# Patient Record
Sex: Male | Born: 1965
Health system: Southern US, Community
[De-identification: ages and names within clinical notes are randomized; demographics above are authoritative.]

## PROBLEM LIST (undated history)

## (undated) DIAGNOSIS — K279 Peptic ulcer, site unspecified, unspecified as acute or chronic, without hemorrhage or perforation: Secondary | ICD-10-CM

## (undated) DIAGNOSIS — K219 Gastro-esophageal reflux disease without esophagitis: Secondary | ICD-10-CM

## (undated) DIAGNOSIS — I4891 Unspecified atrial fibrillation: Secondary | ICD-10-CM

## (undated) DIAGNOSIS — G4733 Obstructive sleep apnea (adult) (pediatric): Secondary | ICD-10-CM

## (undated) DIAGNOSIS — I428 Other cardiomyopathies: Secondary | ICD-10-CM

## (undated) DIAGNOSIS — I10 Essential (primary) hypertension: Secondary | ICD-10-CM

## (undated) DIAGNOSIS — Z9119 Patient's noncompliance with other medical treatment and regimen: Secondary | ICD-10-CM

## (undated) DIAGNOSIS — Z9889 Other specified postprocedural states: Secondary | ICD-10-CM

## (undated) DIAGNOSIS — Z91199 Patient's noncompliance with other medical treatment and regimen due to unspecified reason: Secondary | ICD-10-CM

## (undated) DIAGNOSIS — Z9981 Dependence on supplemental oxygen: Secondary | ICD-10-CM

## (undated) DIAGNOSIS — J45909 Unspecified asthma, uncomplicated: Secondary | ICD-10-CM

## (undated) DIAGNOSIS — I509 Heart failure, unspecified: Secondary | ICD-10-CM

## (undated) DIAGNOSIS — I219 Acute myocardial infarction, unspecified: Secondary | ICD-10-CM

## (undated) DIAGNOSIS — M1990 Unspecified osteoarthritis, unspecified site: Secondary | ICD-10-CM

## (undated) HISTORY — DX: Unspecified osteoarthritis, unspecified site: M19.90

## (undated) HISTORY — DX: Peptic ulcer, site unspecified, unspecified as acute or chronic, without hemorrhage or perforation: K27.9

## (undated) HISTORY — DX: Gastro-esophageal reflux disease without esophagitis: K21.9

---

## 2005-10-30 ENCOUNTER — Ambulatory Visit: Payer: Self-pay | Admitting: *Deleted

## 2005-10-30 ENCOUNTER — Observation Stay (HOSPITAL_COMMUNITY): Admission: EM | Admit: 2005-10-30 | Discharge: 2005-10-31 | Payer: Self-pay | Admitting: Emergency Medicine

## 2007-07-07 IMAGING — CR DG CHEST 1V PORT
1 series · 1 of 1 positions shown · non-contrast
Comparison: none

CLINICAL DATA: 39-year-old female, shortness of breath, history of asthma.
 PORTABLE CHEST ? 1 VIEW:

[view not recorded]
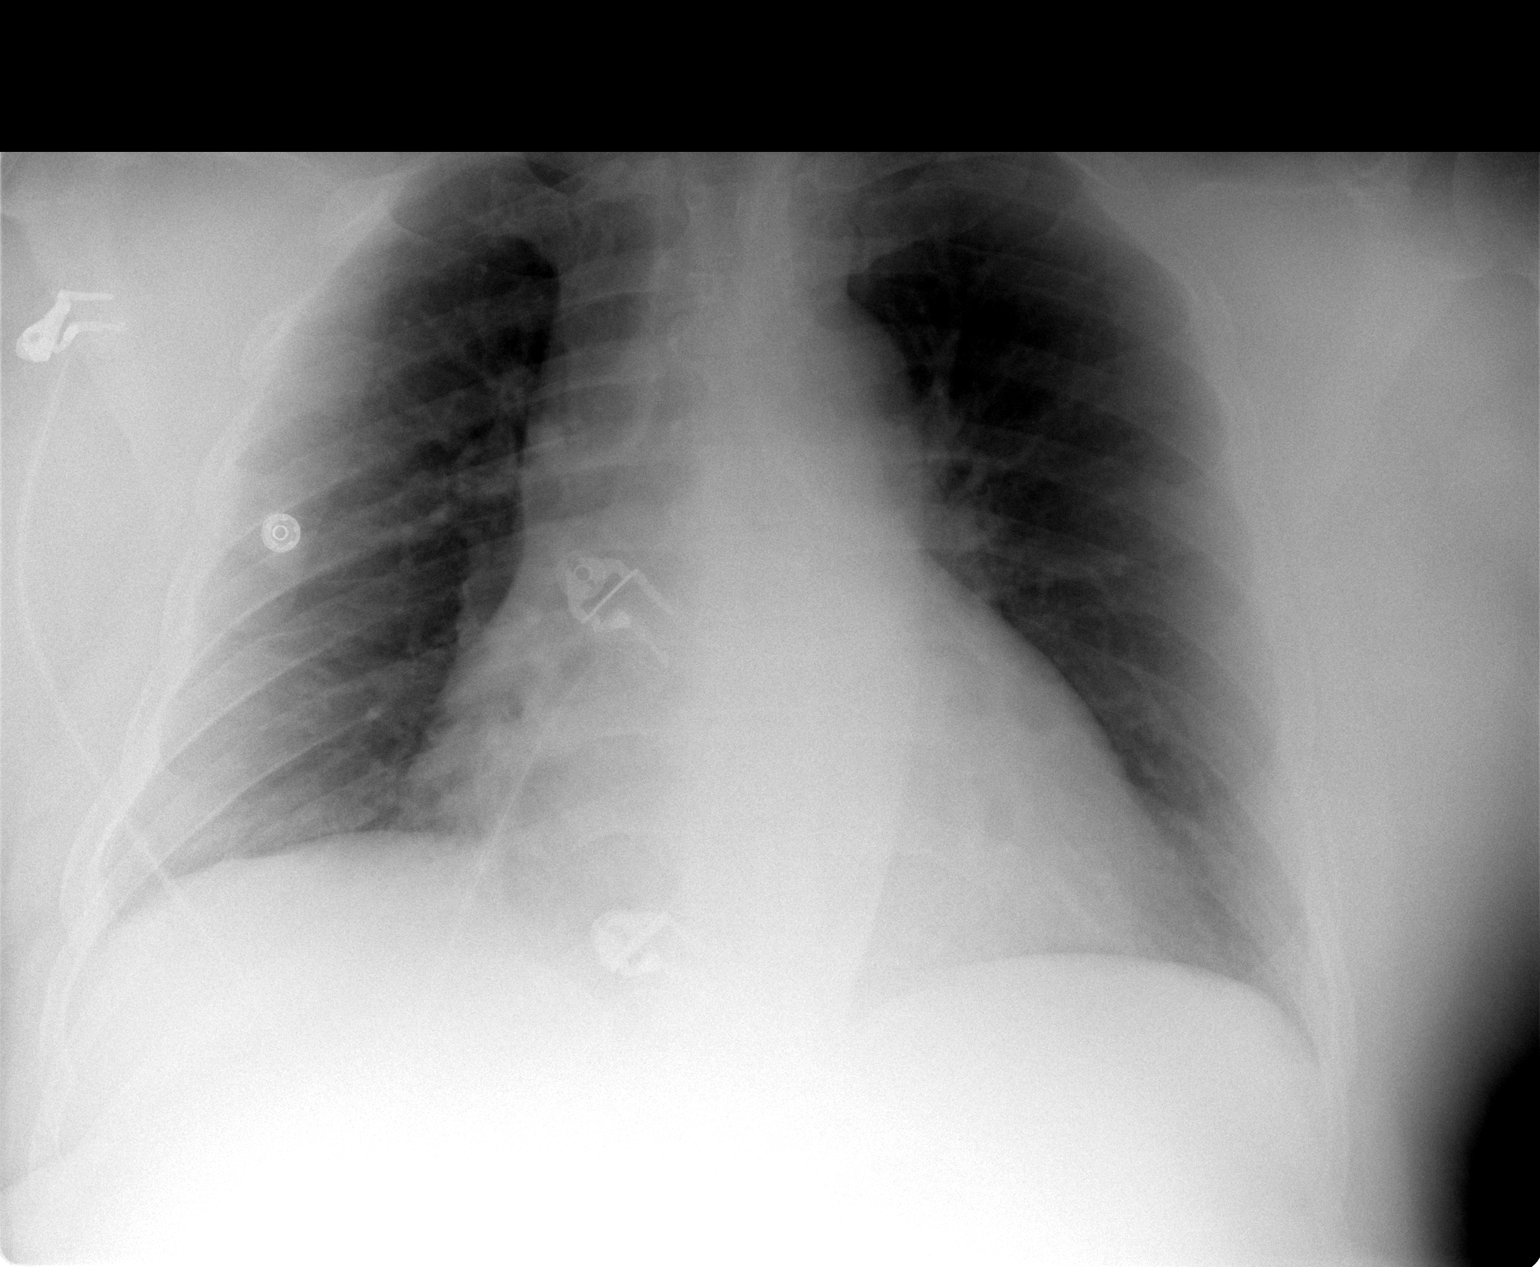

[1 of 1 positions shown; findings below may reference images not displayed]

FINDINGS: Cardiomegaly with mild vascular congestion and right base atelectasis.  No definite edema, consolidation, pneumonia, effusion, or pneumothorax.
IMPRESSION: 1.  Cardiomegaly and vascular congestion.
 2.  Right base atelectasis.

## 2007-12-04 ENCOUNTER — Emergency Department (HOSPITAL_COMMUNITY): Admission: EM | Admit: 2007-12-04 | Discharge: 2007-12-04 | Payer: Self-pay | Admitting: Emergency Medicine

## 2007-12-06 ENCOUNTER — Emergency Department (HOSPITAL_COMMUNITY): Admission: EM | Admit: 2007-12-06 | Discharge: 2007-12-06 | Payer: Self-pay | Admitting: Emergency Medicine

## 2007-12-07 ENCOUNTER — Observation Stay (HOSPITAL_COMMUNITY): Admission: EM | Admit: 2007-12-07 | Discharge: 2007-12-08 | Payer: Self-pay | Admitting: Emergency Medicine

## 2007-12-07 ENCOUNTER — Encounter: Payer: Self-pay | Admitting: Cardiology

## 2007-12-07 ENCOUNTER — Ambulatory Visit: Payer: Self-pay | Admitting: Cardiology

## 2008-01-30 ENCOUNTER — Ambulatory Visit: Payer: Self-pay | Admitting: Cardiology

## 2008-02-13 ENCOUNTER — Ambulatory Visit: Payer: Self-pay | Admitting: Cardiology

## 2008-02-14 ENCOUNTER — Ambulatory Visit: Payer: Self-pay | Admitting: Cardiology

## 2008-02-14 ENCOUNTER — Encounter (HOSPITAL_COMMUNITY): Admission: RE | Admit: 2008-02-14 | Discharge: 2008-03-15 | Payer: Self-pay | Admitting: Cardiology

## 2008-02-20 ENCOUNTER — Ambulatory Visit: Payer: Self-pay | Admitting: Cardiology

## 2008-02-29 ENCOUNTER — Ambulatory Visit: Payer: Self-pay | Admitting: Cardiology

## 2008-07-03 ENCOUNTER — Ambulatory Visit: Payer: Self-pay | Admitting: Cardiology

## 2008-07-12 ENCOUNTER — Ambulatory Visit: Payer: Self-pay | Admitting: Internal Medicine

## 2008-07-12 DIAGNOSIS — K219 Gastro-esophageal reflux disease without esophagitis: Secondary | ICD-10-CM

## 2008-07-12 DIAGNOSIS — F3289 Other specified depressive episodes: Secondary | ICD-10-CM | POA: Insufficient documentation

## 2008-07-12 DIAGNOSIS — K279 Peptic ulcer, site unspecified, unspecified as acute or chronic, without hemorrhage or perforation: Secondary | ICD-10-CM

## 2008-07-12 DIAGNOSIS — I509 Heart failure, unspecified: Secondary | ICD-10-CM | POA: Insufficient documentation

## 2008-07-12 DIAGNOSIS — M1990 Unspecified osteoarthritis, unspecified site: Secondary | ICD-10-CM | POA: Insufficient documentation

## 2008-07-12 DIAGNOSIS — F411 Generalized anxiety disorder: Secondary | ICD-10-CM

## 2008-07-12 DIAGNOSIS — I10 Essential (primary) hypertension: Secondary | ICD-10-CM | POA: Insufficient documentation

## 2008-07-12 DIAGNOSIS — J309 Allergic rhinitis, unspecified: Secondary | ICD-10-CM | POA: Insufficient documentation

## 2008-07-12 DIAGNOSIS — R0602 Shortness of breath: Secondary | ICD-10-CM

## 2008-07-12 DIAGNOSIS — E785 Hyperlipidemia, unspecified: Secondary | ICD-10-CM | POA: Insufficient documentation

## 2008-07-12 DIAGNOSIS — F329 Major depressive disorder, single episode, unspecified: Secondary | ICD-10-CM

## 2008-07-13 ENCOUNTER — Ambulatory Visit: Admission: RE | Admit: 2008-07-13 | Discharge: 2008-07-13 | Payer: Self-pay | Admitting: Physician Assistant

## 2008-08-27 ENCOUNTER — Emergency Department (HOSPITAL_COMMUNITY): Admission: EM | Admit: 2008-08-27 | Discharge: 2008-08-27 | Payer: Self-pay | Admitting: Emergency Medicine

## 2008-08-28 ENCOUNTER — Emergency Department (HOSPITAL_COMMUNITY): Admission: EM | Admit: 2008-08-28 | Discharge: 2008-08-28 | Payer: Self-pay | Admitting: Emergency Medicine

## 2008-12-11 ENCOUNTER — Ambulatory Visit: Payer: Self-pay | Admitting: Cardiology

## 2008-12-25 ENCOUNTER — Encounter: Payer: Self-pay | Admitting: Cardiology

## 2008-12-25 ENCOUNTER — Encounter (INDEPENDENT_AMBULATORY_CARE_PROVIDER_SITE_OTHER): Payer: Self-pay | Admitting: *Deleted

## 2008-12-25 LAB — CONVERTED CEMR LAB
AST: 25 units/L
Alkaline Phosphatase: 48 units/L
Alkaline Phosphatase: 48 units/L (ref 39–117)
BUN: 13 mg/dL
BUN: 13 mg/dL (ref 6–23)
CO2: 26 meq/L
CO2: 26 meq/L (ref 19–32)
Calcium: 9.2 mg/dL
Calcium: 9.2 mg/dL (ref 8.4–10.5)
Cholesterol: 178 mg/dL
Cholesterol: 178 mg/dL (ref 0–200)
Glucose, Bld: 89 mg/dL
LDL Cholesterol: 113 mg/dL
LDL Cholesterol: 113 mg/dL — ABNORMAL HIGH (ref 0–99)
Potassium: 3.8 meq/L
Sodium: 142 meq/L (ref 135–145)
Total CHOL/HDL Ratio: 4.3
Total Protein: 6.7 g/dL
Total Protein: 6.7 g/dL (ref 6.0–8.3)
Triglycerides: 119 mg/dL (ref <150)

## 2009-01-01 ENCOUNTER — Telehealth (INDEPENDENT_AMBULATORY_CARE_PROVIDER_SITE_OTHER): Payer: Self-pay | Admitting: *Deleted

## 2009-02-11 ENCOUNTER — Ambulatory Visit: Payer: Self-pay | Admitting: Cardiology

## 2009-02-11 DIAGNOSIS — J45909 Unspecified asthma, uncomplicated: Secondary | ICD-10-CM

## 2009-02-11 DIAGNOSIS — G4730 Sleep apnea, unspecified: Secondary | ICD-10-CM

## 2009-03-11 ENCOUNTER — Ambulatory Visit: Payer: Self-pay | Admitting: Cardiology

## 2009-03-14 ENCOUNTER — Telehealth: Payer: Self-pay | Admitting: Cardiology

## 2009-04-08 ENCOUNTER — Ambulatory Visit: Payer: Self-pay | Admitting: Cardiology

## 2009-08-12 IMAGING — CR DG CHEST 2V
2 series · 2 of 2 positions shown · non-contrast
Comparison: 10/30/2005

CLINICAL DATA: Dyspnea, left chest pain, history asthma, smoking,
hypertension

CHEST - 2 VIEW

[view not recorded (1 of 2)]
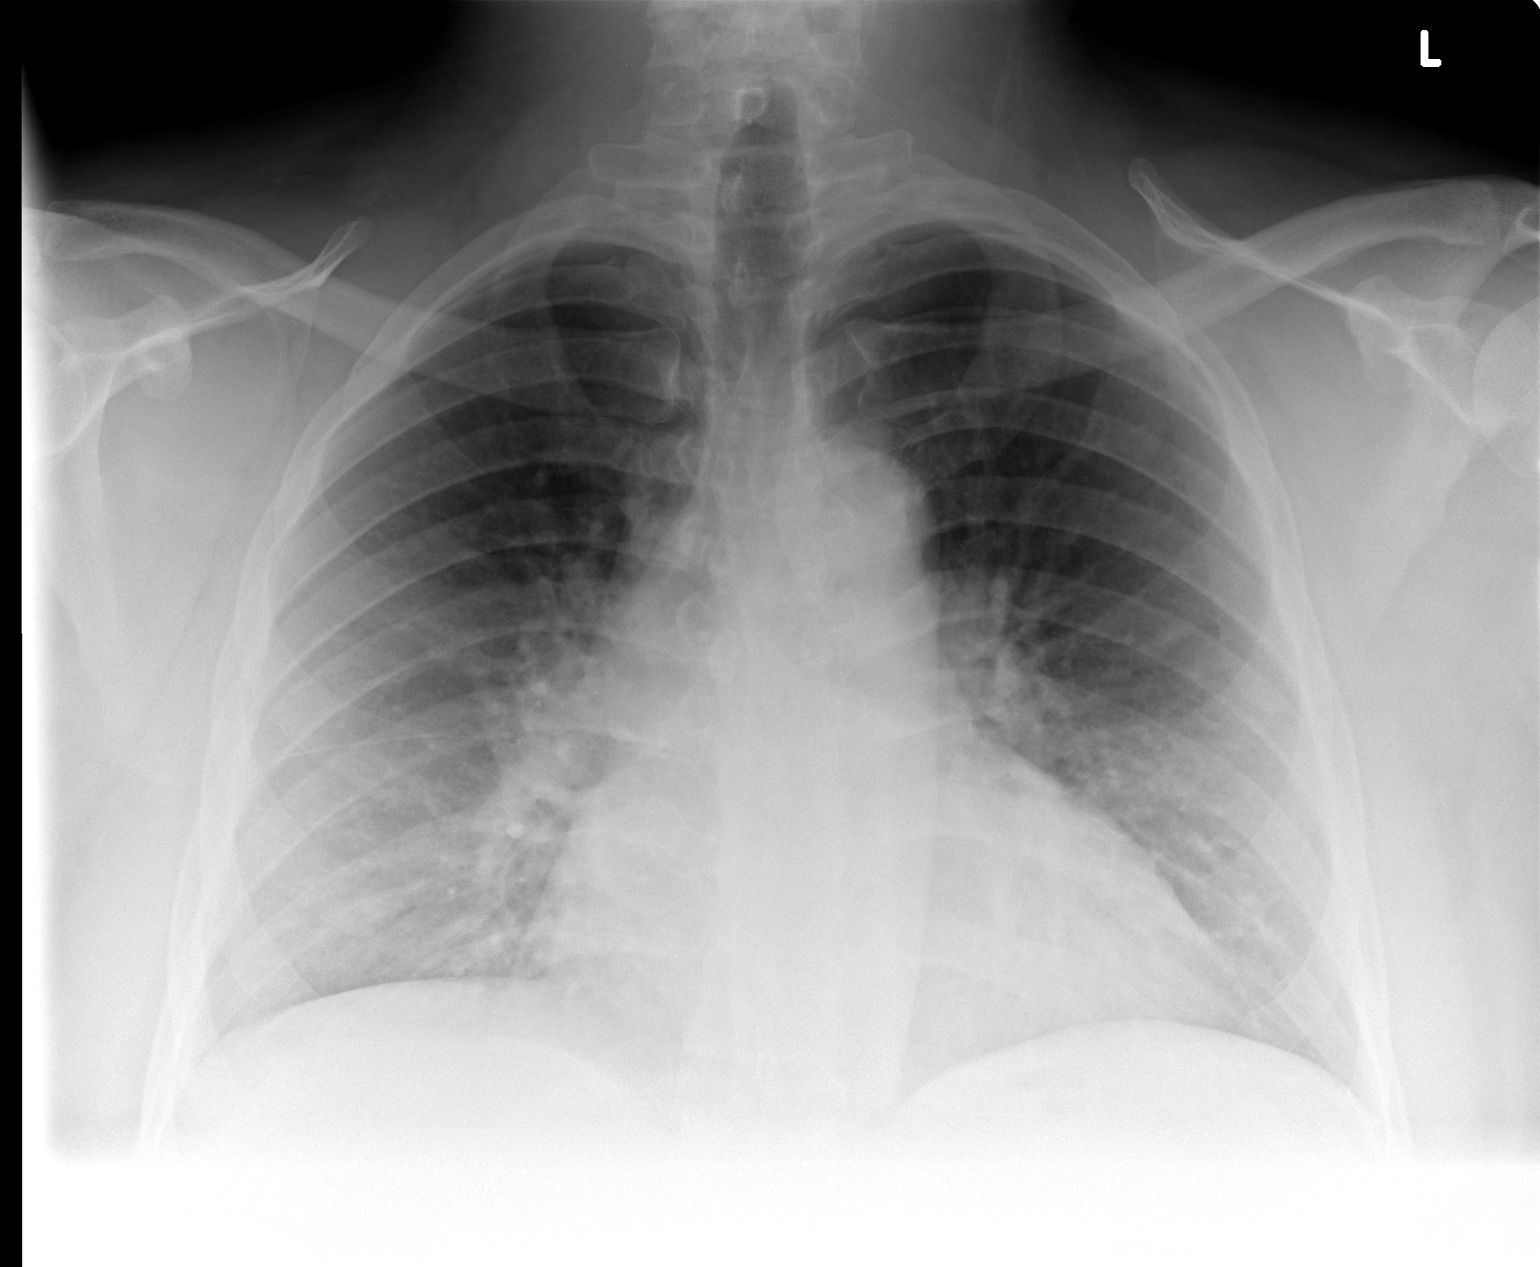

[view not recorded (2 of 2)]
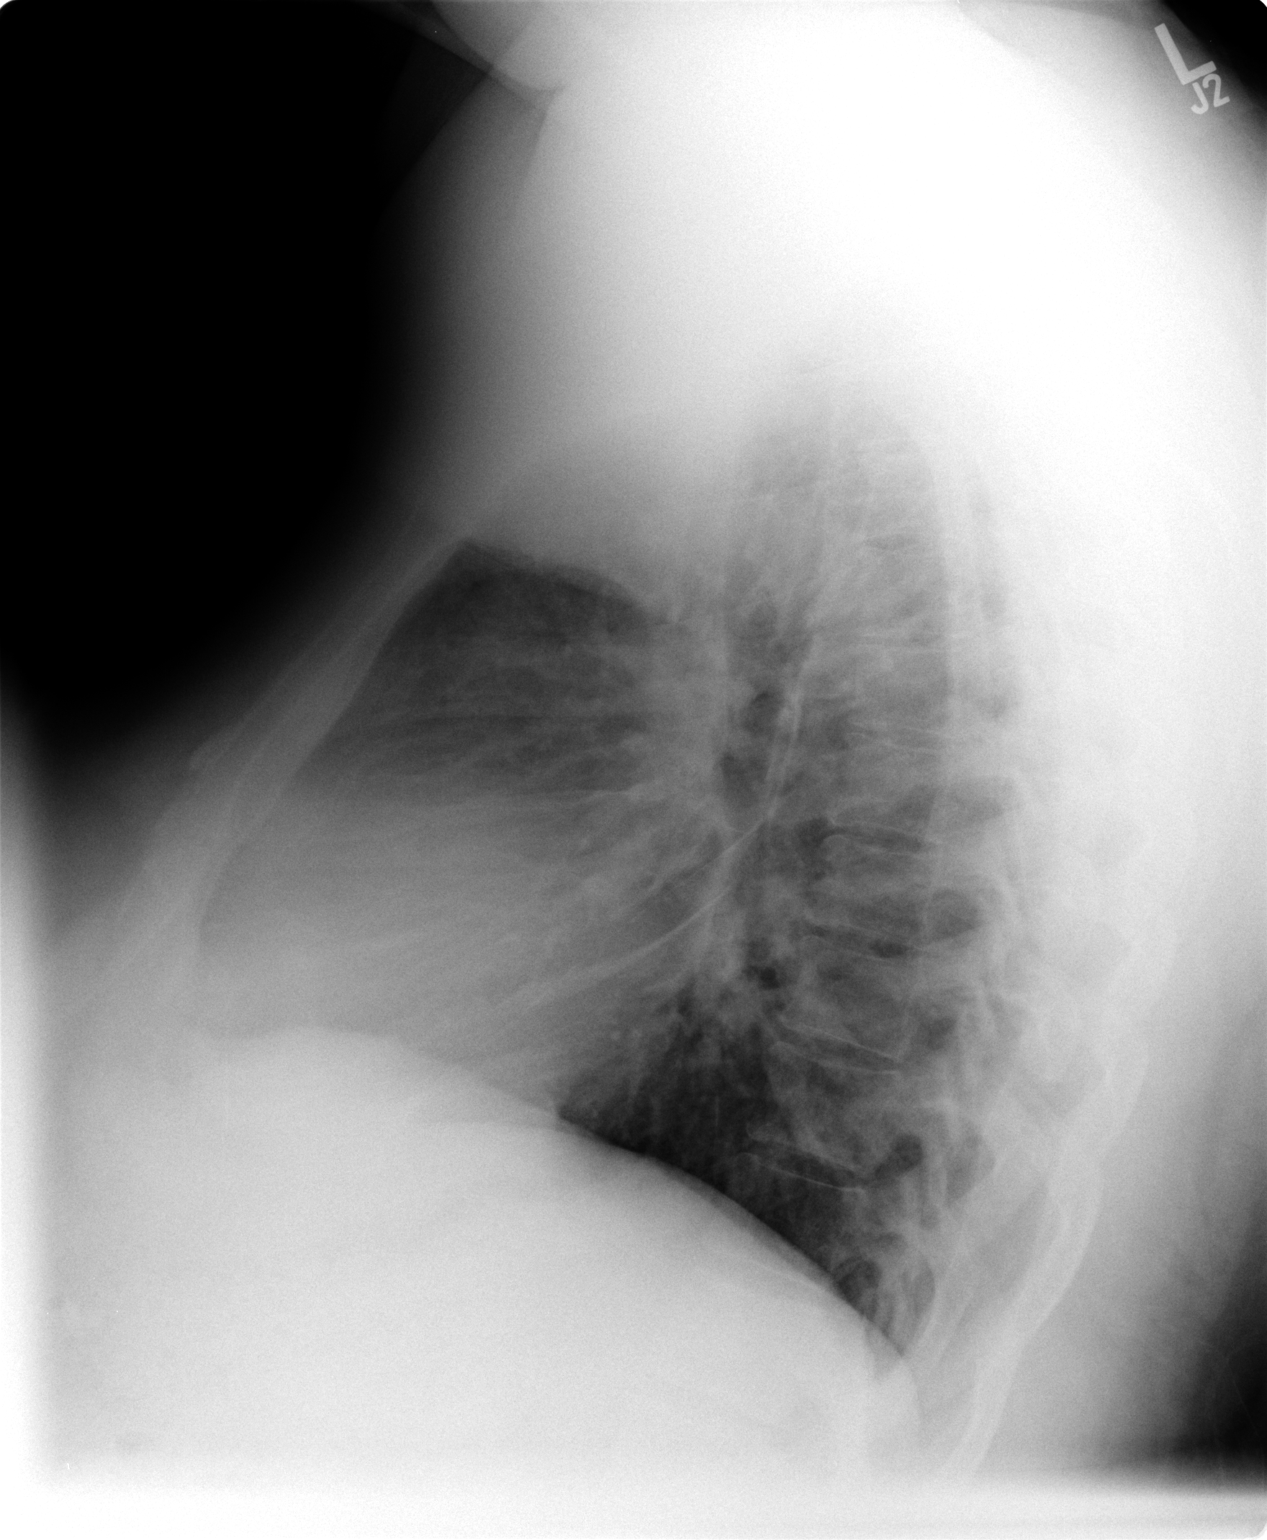

[2 of 2 positions shown; findings below may reference images not displayed]

FINDINGS: Mild cardiac enlargement.
Mild pulmonary vascular congestion.
Minimal fullness of hila bilaterally, suspect related to vascular
structures.
Mediastinal contours otherwise normal.
No acute failure or consolidation.
Bones unremarkable.
No pleural effusion or pneumothorax.
IMPRESSION: Cardiomegaly with slight pulmonary vascular congestion.
No definite acute abnormalities.

## 2009-08-13 IMAGING — CR DG CHEST 1V PORT
1 series · 1 of 1 positions shown · non-contrast
Comparison: 12/06/2007 portable chest.

CLINICAL DATA: Shortness of breath.  Chest pain.

PORTABLE CHEST - 1 VIEW

[view not recorded]
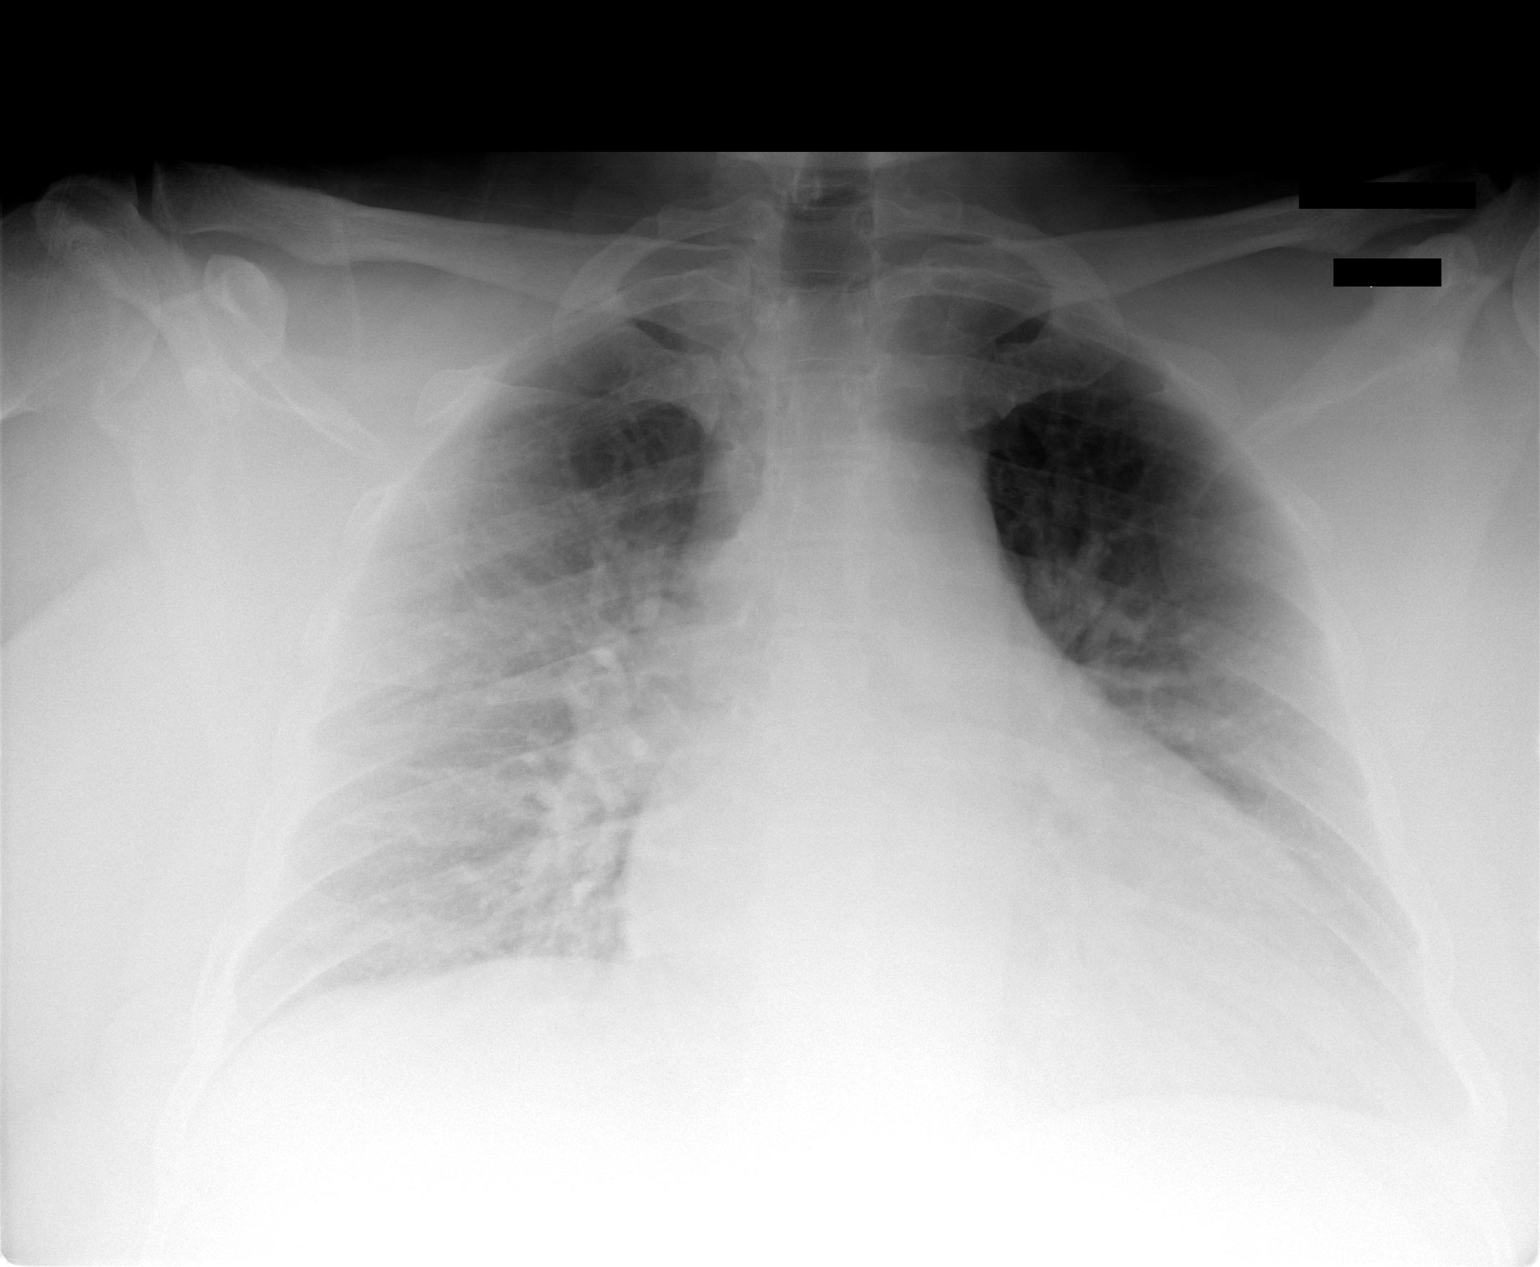

[1 of 1 positions shown; findings below may reference images not displayed]

FINDINGS: There is cardiomegaly with mild interstitial pulmonary
edema.  No pleural effusion.  No focal bony abnormality.
IMPRESSION: Cardiomegaly mild interstitial edema.

## 2009-10-21 IMAGING — NM NM MYOCAR MULTI W/ SPECT
1 series · 6 of 6 positions shown · non-contrast
Comparison: none

02/16/08 - DUPLICATE COPY for exam association in [facility] – No change from original report.
 Ordering Physician: Kaki Jim

 Bosigo Physician: [REDACTED]al Data: 41-year-old gentleman with severe hypertension and
 chest pain.
 NUCLEAR MEDICINE STRESS MYOVIEW STUDY WITH SPECT AND LEFT
 VENTRICULAR EJECTION FRACTION
 Radionuclide Data: One-day rest/stress protocol performed with
 [DATE] mCi of Pc-00m Myoview.
 Stress Data: Treadmill exercise performed to a workload of five
 mets and a heart rate of 131, 72% of age - predicted maximum.
 Exercise discontinued due to dyspnea, fatigue and leg fatigue; no
 chest pain reported. Blood pressure increased from a resting value
 of 140/80 to 180/90 early in recovery, a normal response. No
 arrhythmias noted.
 EKG: Normal sinus rhythm; borderline left atrial abnormality;
 delayed R-wave progression; nonspecific lateral ST - T-wave
 abnormality.
 Stress EKG: Insignificant upsloping ST-segment depression
 Scintigraphic Data: Acquisition notable for some diaphragmatic and
 substantial soft tissue attenuation. Low counts density resulted
 in limited quality images. The left ventricle was mildly dilated.
 On tomographic images reconstructed in standard planes, there was a
 very small inferior wall segment with some decrease in tracer
 uptake. This was not numerically significant by quantitative
 analysis, and no reversibility was apparent by comparison of the
 resting and stress images. The gated reconstruction demonstrated
 substantial hypokinesis of the basilar septum and less impressive
 hypokinesis of the distal septum and apex. Overall left
 ventricular systolic function was mildly impaired. Probably normal
 systolic accentuation of activity in all myocardial segments;
 however, due to the limited quality of the images, it is difficult
 to establish this with certainty.

[Series 1: cr cardiac tc low dose · 6.52mm/px · 6 of 64 frames shown]
[frame 6/64]
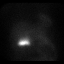
[frame 16/64]
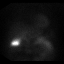
[frame 27/64]
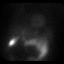
[frame 38/64]
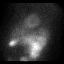
[frame 48/64]
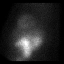
[frame 59/64]
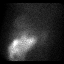

[6 of 6 positions shown; findings below may reference images not displayed]

IMPRESSION: Probably negative stress nuclear myocardial study revealing
 impaired exercise capacity, no significant stress - induced EKG
 abnormalities, mild left ventricular dilatation and mildly impaired
 left ventricular systolic function in a segmental pattern. By
 scintigraphic imaging, there was diaphragmatic attenuation but no
 convincing evidence for ischemia or infarction.

## 2009-10-21 IMAGING — NM NM MYOCAR PERF EJECTION FRACTION
1 series · 6 of 6 positions shown · non-contrast
Comparison: none

02/16/08 - DUPLICATE COPY for exam association in [facility] – No change from original report.
 Ordering Physician: Kaki Jim

 Bosigo Physician: [REDACTED]al Data: 41-year-old gentleman with severe hypertension and
 chest pain.
 NUCLEAR MEDICINE STRESS MYOVIEW STUDY WITH SPECT AND LEFT
 VENTRICULAR EJECTION FRACTION
 Radionuclide Data: One-day rest/stress protocol performed with
 [DATE] mCi of Pc-00m Myoview.
 Stress Data: Treadmill exercise performed to a workload of five
 mets and a heart rate of 131, 72% of age - predicted maximum.
 Exercise discontinued due to dyspnea, fatigue and leg fatigue; no
 chest pain reported. Blood pressure increased from a resting value
 of 140/80 to 180/90 early in recovery, a normal response. No
 arrhythmias noted.
 EKG: Normal sinus rhythm; borderline left atrial abnormality;
 delayed R-wave progression; nonspecific lateral ST - T-wave
 abnormality.
 Stress EKG: Insignificant upsloping ST-segment depression
 Scintigraphic Data: Acquisition notable for some diaphragmatic and
 substantial soft tissue attenuation. Low counts density resulted
 in limited quality images. The left ventricle was mildly dilated.
 On tomographic images reconstructed in standard planes, there was a
 very small inferior wall segment with some decrease in tracer
 uptake. This was not numerically significant by quantitative
 analysis, and no reversibility was apparent by comparison of the
 resting and stress images. The gated reconstruction demonstrated
 substantial hypokinesis of the basilar septum and less impressive
 hypokinesis of the distal septum and apex. Overall left
 ventricular systolic function was mildly impaired. Probably normal
 systolic accentuation of activity in all myocardial segments;
 however, due to the limited quality of the images, it is difficult
 to establish this with certainty.

[Series 1: cs cardiac tc hi dose · 6.52mm/px · 6 of 512 frames shown]
[frame 43/512]
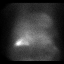
[frame 128/512]
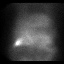
[frame 214/512]
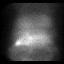
[frame 299/512]
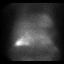
[frame 384/512]
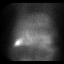
[frame 470/512]
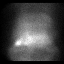

[6 of 6 positions shown; findings below may reference images not displayed]

IMPRESSION: Probably negative stress nuclear myocardial study revealing
 impaired exercise capacity, no significant stress - induced EKG
 abnormalities, mild left ventricular dilatation and mildly impaired
 left ventricular systolic function in a segmental pattern. By
 scintigraphic imaging, there was diaphragmatic attenuation but no
 convincing evidence for ischemia or infarction.

## 2009-11-13 ENCOUNTER — Encounter (INDEPENDENT_AMBULATORY_CARE_PROVIDER_SITE_OTHER): Payer: Self-pay | Admitting: *Deleted

## 2009-11-22 ENCOUNTER — Ambulatory Visit: Payer: Self-pay | Admitting: Cardiology

## 2009-12-09 ENCOUNTER — Encounter (INDEPENDENT_AMBULATORY_CARE_PROVIDER_SITE_OTHER): Payer: Self-pay | Admitting: *Deleted

## 2010-01-24 ENCOUNTER — Ambulatory Visit: Payer: Self-pay | Admitting: Cardiology

## 2010-01-24 ENCOUNTER — Encounter (INDEPENDENT_AMBULATORY_CARE_PROVIDER_SITE_OTHER): Payer: Self-pay | Admitting: *Deleted

## 2010-01-24 LAB — CONVERTED CEMR LAB
ALT: 29 units/L (ref 0–53)
AST: 25 units/L
AST: 25 units/L (ref 0–37)
Albumin: 4.4 g/dL (ref 3.5–5.2)
Alkaline Phosphatase: 59 units/L (ref 39–117)
Basophils Absolute: 0 10*3/uL
Basophils Relative: 0 %
Bilirubin Urine: NEGATIVE
Calcium: 9.1 mg/dL
Calcium: 9.1 mg/dL (ref 8.4–10.5)
Chloride: 105 meq/L
Creatinine, Ser: 1.03 mg/dL
Eosinophils Absolute: 0.1 10*3/uL (ref 0.0–0.7)
Eosinophils Relative: 2 %
Eosinophils Relative: 2 % (ref 0–5)
HCT: 45.2 %
HCT: 45.2 % (ref 39.0–52.0)
Hemoglobin: 15.3 g/dL
Hemoglobin: 15.3 g/dL (ref 13.0–17.0)
Ketones, ur: NEGATIVE mg/dL
Leukocytes, UA: NEGATIVE
Lymphocytes Relative: 36 %
Lymphocytes Relative: 36 % (ref 12–46)
Lymphs Abs: 1.9 10*3/uL
MCHC: 33.8 g/dL
MCHC: 33.8 g/dL (ref 30.0–36.0)
MCV: 84.6 fL
MCV: 84.6 fL (ref 78.0–100.0)
Monocytes Absolute: 0.4 10*3/uL
Monocytes Relative: 7 % (ref 3–12)
Platelets: 211 10*3/uL
Platelets: 211 10*3/uL (ref 150–400)
Potassium: 4 meq/L
Potassium: 4 meq/L (ref 3.5–5.3)
RBC: 5.34 M/uL (ref 4.22–5.81)
Sodium: 142 meq/L
Sodium: 142 meq/L (ref 135–145)
Total Protein: 6.5 g/dL
Total Protein: 6.5 g/dL (ref 6.0–8.3)
Urobilinogen, UA: 1
WBC: 5.2 10*3/uL

## 2010-01-27 ENCOUNTER — Encounter (INDEPENDENT_AMBULATORY_CARE_PROVIDER_SITE_OTHER): Payer: Self-pay | Admitting: *Deleted

## 2010-04-11 ENCOUNTER — Encounter (INDEPENDENT_AMBULATORY_CARE_PROVIDER_SITE_OTHER): Payer: Self-pay | Admitting: *Deleted

## 2010-05-04 IMAGING — CR DG CHEST 1V PORT
1 series · 1 of 1 positions shown · non-contrast
Comparison: 12/07/2007

CLINICAL DATA: Short of breath

PORTABLE CHEST - 1 VIEW

[view not recorded]
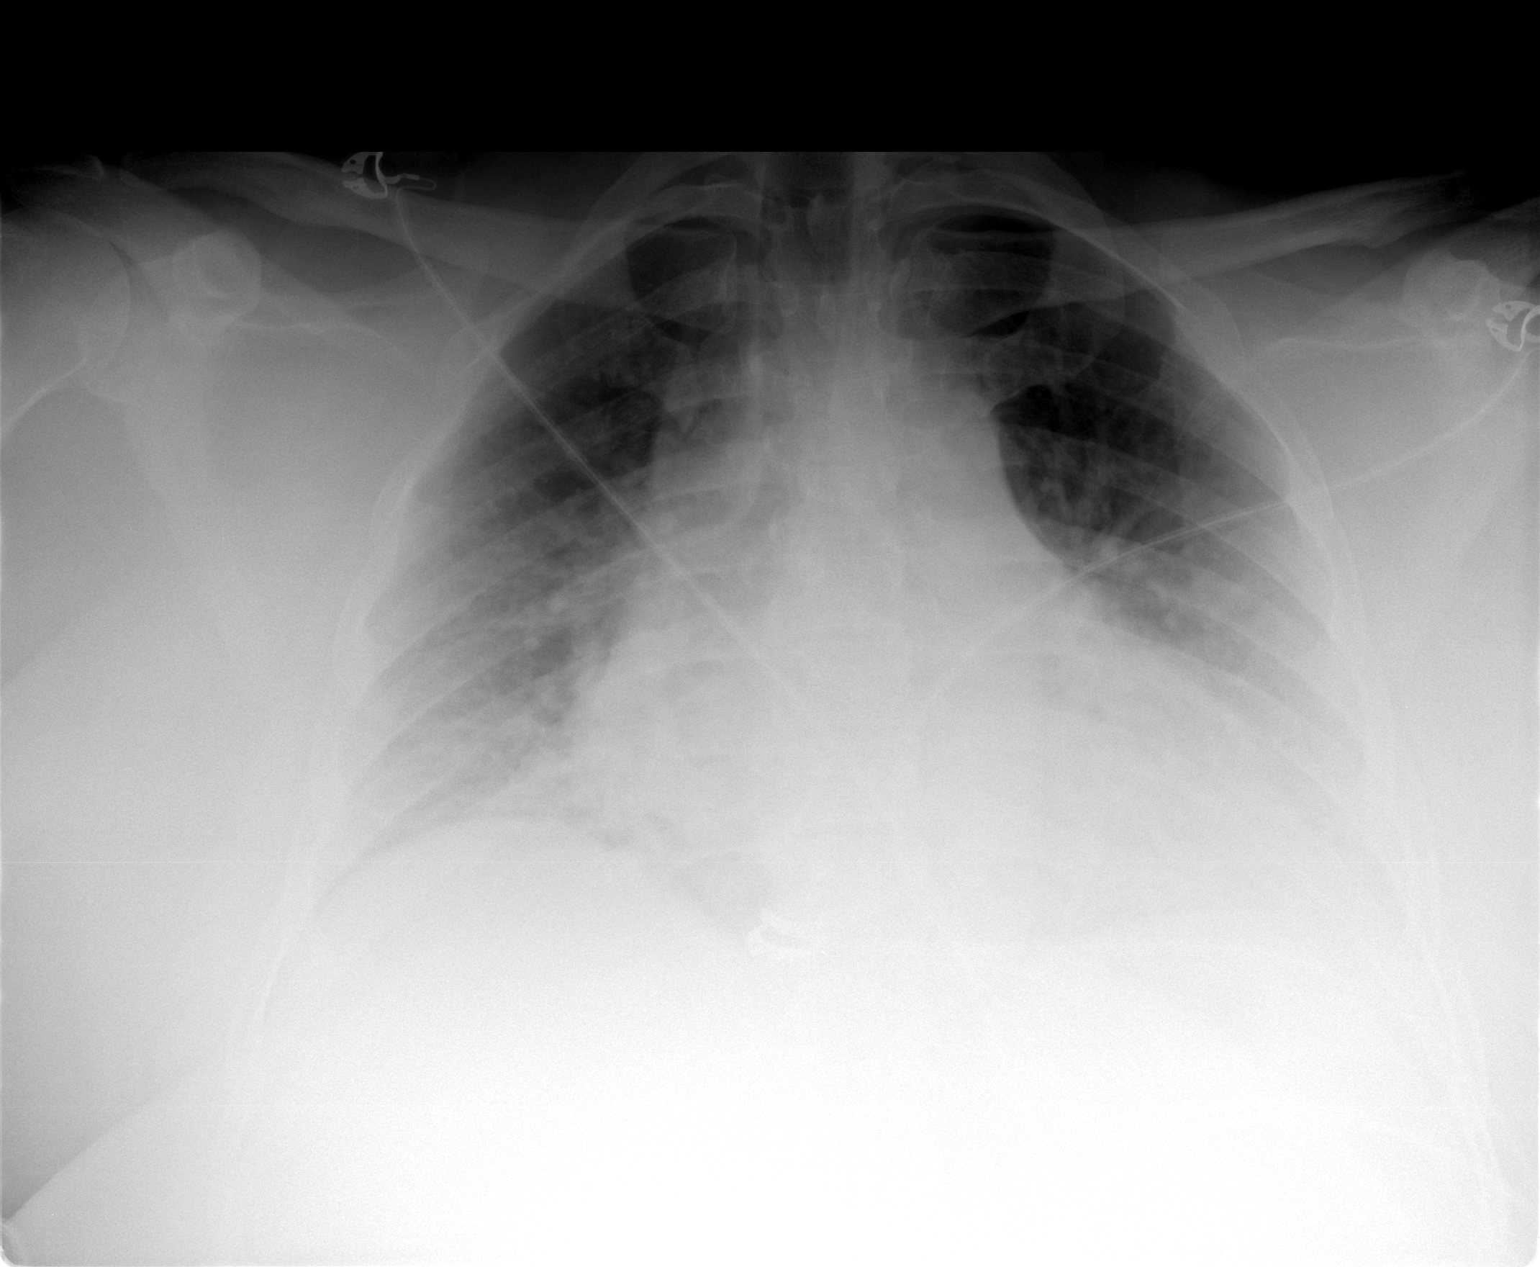

[1 of 1 positions shown; findings below may reference images not displayed]

FINDINGS: Stable cardiomegaly.  Stable mild vascular congestion.
No definite airspace disease or pleural fluid in one-view.
IMPRESSION: Cardiomegaly with mild vascular congestion.

## 2010-05-05 IMAGING — CR DG ABDOMEN ACUTE W/ 1V CHEST
5 series · 5 of 5 positions shown · non-contrast
Comparison: No prior abdomen x-rays.  Portable chest x-ray
yesterday.

CLINICAL DATA: Lower abdominal pain.  Shortness of breath.

ACUTE ABDOMEN SERIES (ABDOMEN 2 VIEW & CHEST 1 VIEW) 08/28/2008:

[view not recorded (1 of 5)]
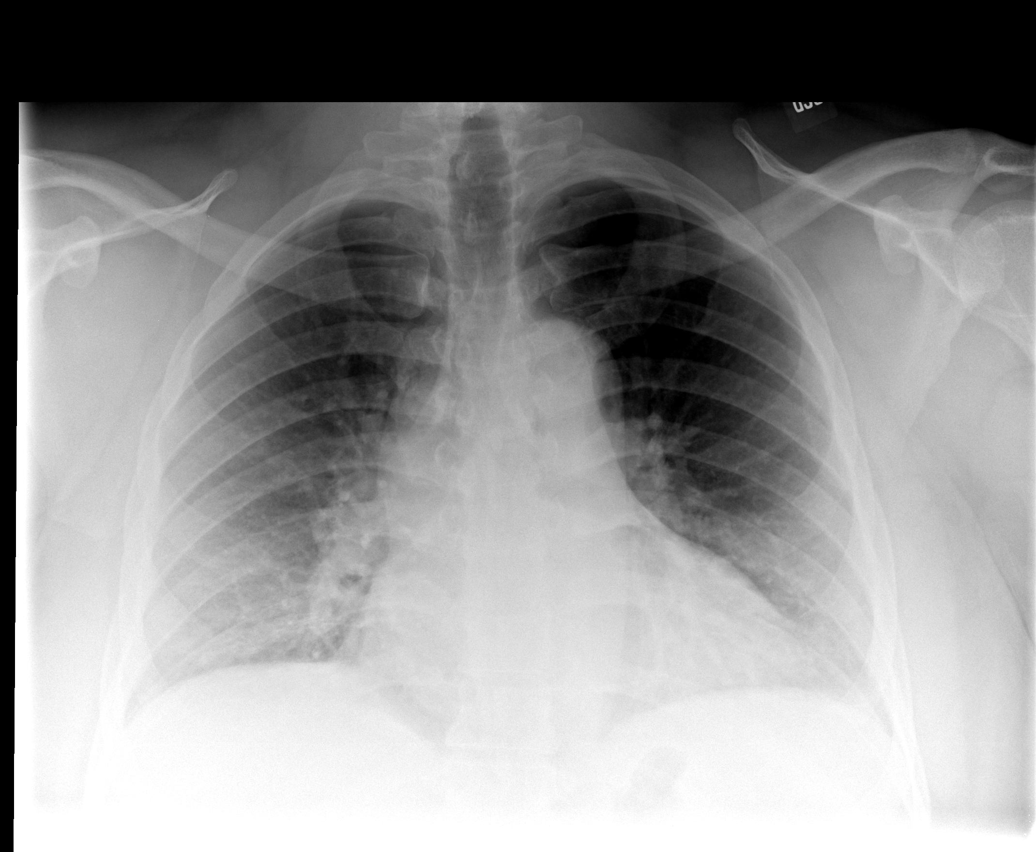

[view not recorded (2 of 5)]
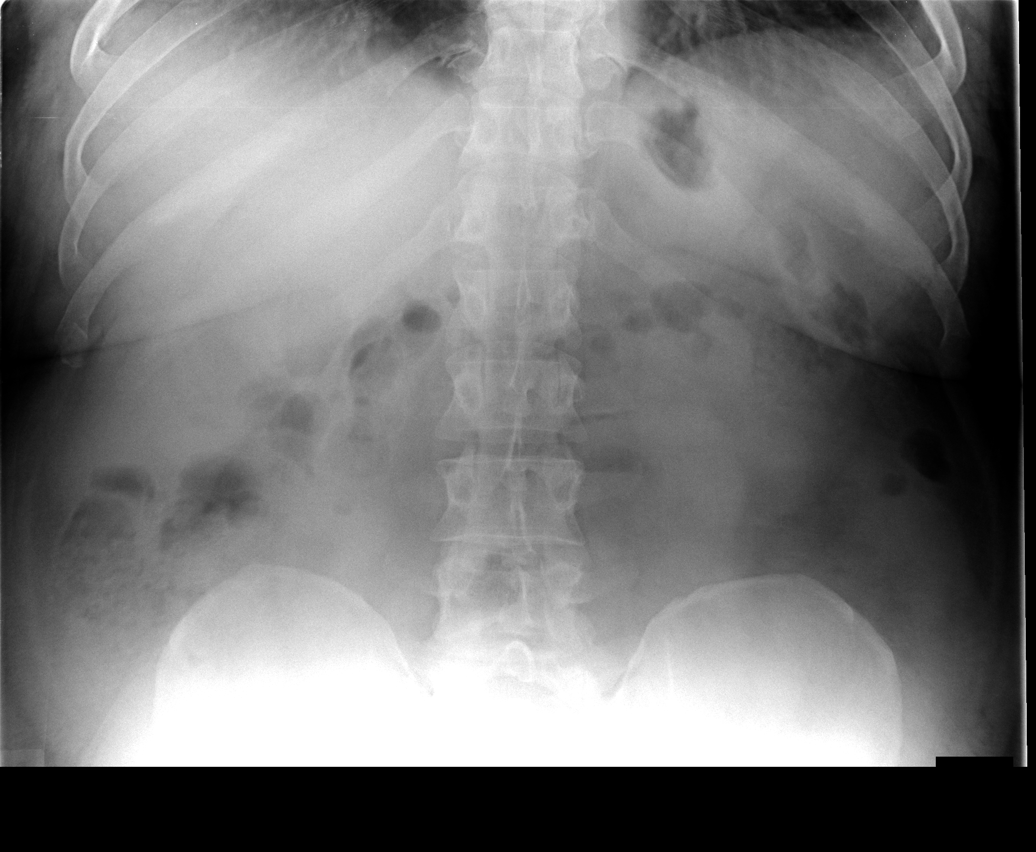

[view not recorded (3 of 5)]
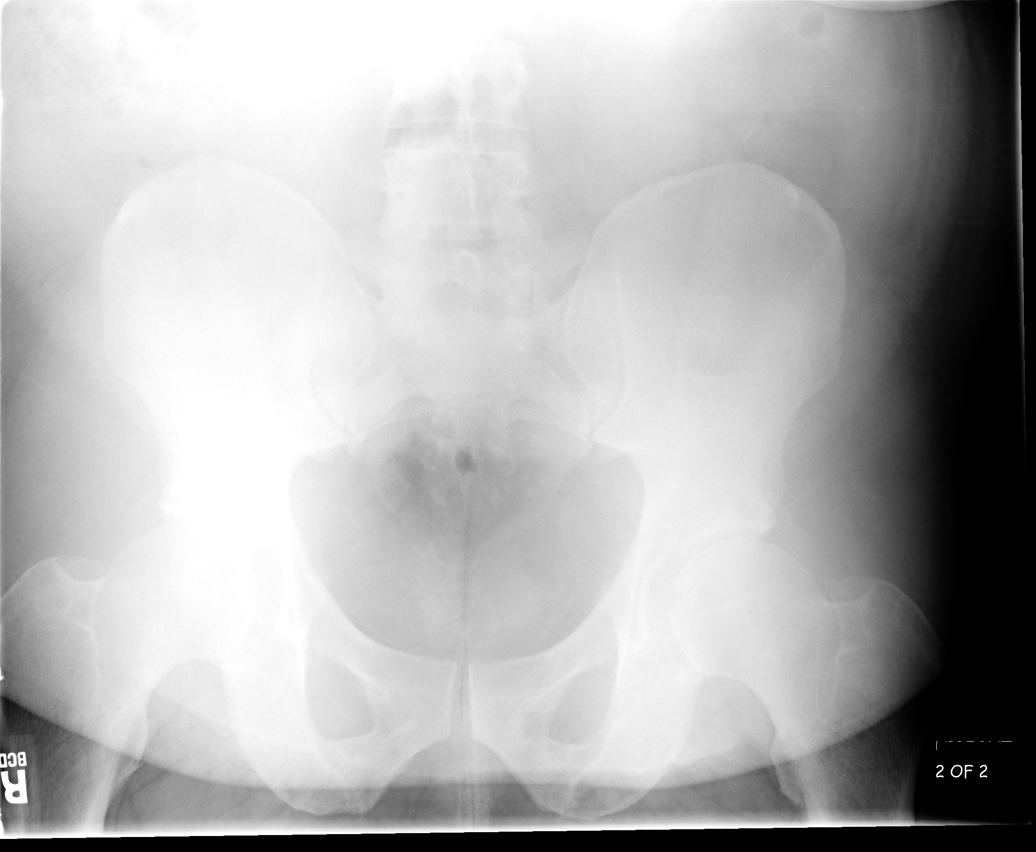

[view not recorded (4 of 5)]
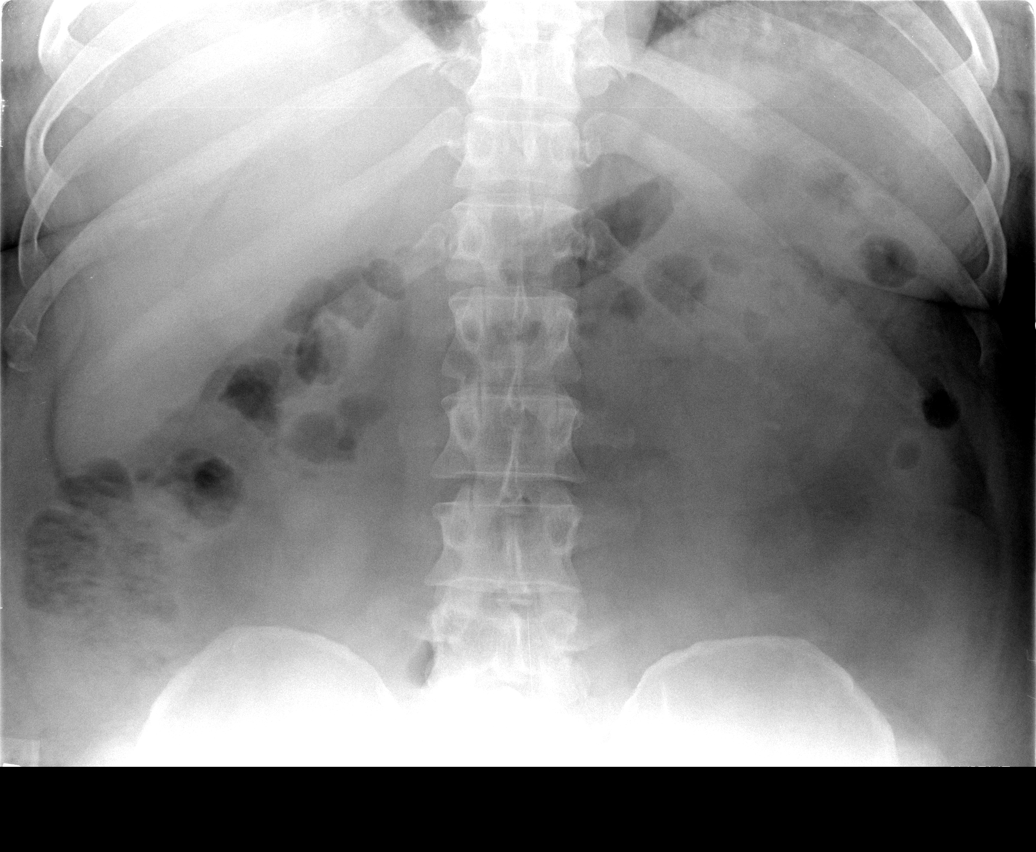

[view not recorded (5 of 5)]
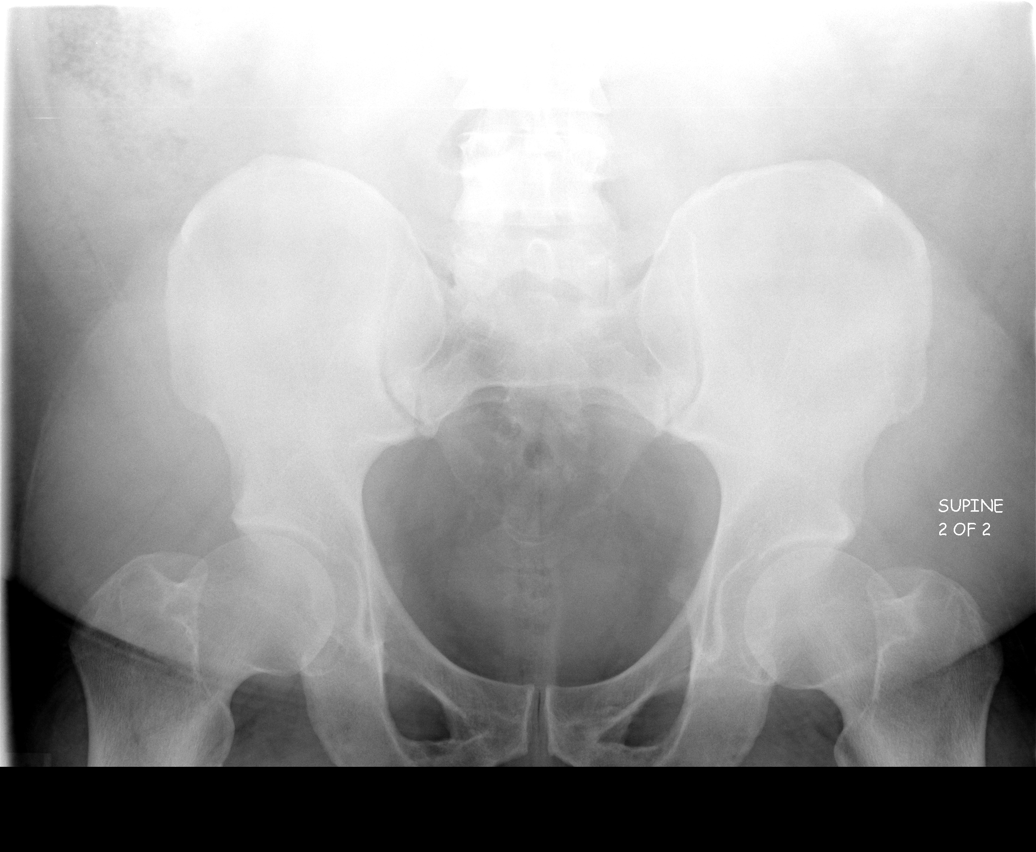

[5 of 5 positions shown; findings below may reference images not displayed]

FINDINGS: Bowel gas pattern unremarkable without evidence of
obstruction or significant ileus.  No evidence of free air or air-
fluid levels on the erect image.  No visible opaque urinary tract
calculi.  Mild degenerative changes involving the lower lumbar
spine.

Heart mildly enlarged.  Pulmonary vascularity normal without
evidence of pulmonary edema.  Suboptimal inspiration accounts for
mild atelectasis in the bases.  Lungs otherwise clear.  No pleural
effusions.
IMPRESSION: 1.  No acute abdominal abnormalities.
2.  Mild cardiomegaly.  Suboptimal inspiration accounts for mild
bibasilar atelectasis.  No acute cardiopulmonary disease otherwise.

## 2010-07-14 ENCOUNTER — Encounter: Payer: Self-pay | Admitting: Cardiology

## 2010-07-22 NOTE — Assessment & Plan Note (Signed)
Summary: ROV  Medications Added LISINOPRIL-HYDROCHLOROTHIAZIDE 20-12.5 MG TABS (LISINOPRIL-HYDROCHLOROTHIAZIDE) Take 2  tablest by mouth two times a day LISINOPRIL-HYDROCHLOROTHIAZIDE 20-12.5 MG TABS (LISINOPRIL-HYDROCHLOROTHIAZIDE) take 2 tablets by mouth daily AMLODIPINE BESYLATE 5 MG TABS (AMLODIPINE BESYLATE) Take one tablet by mouth daily      Allergies Added: NKDA  Visit Type:  Follow-up Primary Provider:  [facility]   History of Present Illness: [name] returns to the office for continued assessment and treatment of asthma, hypertension, obstructive sleep apnea and hyperlipidemia.  Since his last visit, he has experienced difficulties with social support, which in turn has resulted in suboptimal medical care.  He lost Medicaid coverage when he acquired Medicare and disability even though his monthly income is less than $700.  He has been overcharged for antihypertensive medication and has not been able to afford them.  He has not continued to be successful with weight loss.  Nonetheless, symptomatically he has done fairly well.  He notes some ankle edema, but this is not terribly troublesome for him.  Lifestyle is sedentary, but he has little in the way of dyspnea and has not had any exacerbations of asthma despite not being able to afford his inhalers.  He has no orthopnea, no PND, no palpitations, no lightheadedness and no syncope.  Current Medications (verified): 1)  Qvar 80 Mcg/act Aers (Beclomethasone Dipropionate) .... As Needed 2)  Lisinopril-Hydrochlorothiazide 20-12.5 Mg Tabs (Lisinopril-Hydrochlorothiazide) .... Take 2  Tablest By Mouth Two Times A Day 3)  Combivent 103-18 Mcg/act Aero (Ipratropium-Albuterol) .... Take 2 Puffs As Needed 4)  Amlodipine Besylate 5 Mg Tabs (Amlodipine Besylate) .... Take One Tablet By Mouth Daily  Allergies (verified): No Known Drug Allergies  Past History:  PMH, FH, and Social History reviewed and  updated.  Past Medical History: Hypertensive heart disease with prior episodes of CHF Hyperlipidemia Allergic rhinitis/"Asthma" Anxiety/Depression Gastroesophageal reflux disease Osteoarthritis Peptic ulcer disease Morbid obesity/sleep apnea   Review of Systems       See history of present illness.  Vital Signs:  Patient profile:   45 year old male Weight:      358 pounds BMI:     47.40 Pulse rate:   83 / minute BP sitting:   160 / 98  (right arm)  Vitals Entered By: Dreama Saa, CNA (November 22, 2009 2:49 PM)  Physical Exam  General:    Obese; well developed; no acute distress:   Neck-No JVD; no carotid bruits: Lungs-No tachypnea, no rales; no rhonchi; no wheezes; prolonged expiratory phase Cardiovascular-normal PMI; normal S1 and S2: Abdomen-BS normal; soft and non-tender without masses or organomegaly:  Musculoskeletal-No deformities, no cyanosis or clubbing: Neurologic-Normal cranial nerves; symmetric strength and tone:  Skin-Warm, no significant lesions: Extremities-Nl distal pulses; 1/2+ edema; 6-8 cm mass over the posterior right ankle representing either a cyst or soft tissue tumor, likely benign.     Impression & Recommendations:  Problem # 1:  SLEEP APNEA (ICD-780.57) Patient describes some sort of mask at home, but this does not appear to be a BiPAP device.  We will have him bring it in so that we can identify exactly what this piece of equipment is.  A sleep study in January of 2010 verified the presence of severe obstructive sleep apnea with a good response to continuous positive airway pressure of 17.  Severe periodic limb movement disorder was also seen.  Unfortunately, patient has not been able to be treated for either of these.  We will investigate the  availability of appropriate services for him under his Medicare coverage and also determine whether he might be able to resume Medicaid coverage.  Problem # 2:  ASTHMA (ICD-493.90) Apparently his history  of asthma is in doubt.  In any case, this problem does not appear troublesome at the present time.  We will try to secure his metered-dose inhalers under an indigent drug program.  Problem # 3:  OBESITY, MORBID (ICD-278.01) Weight is a continuing problem.  We will plan to locate a program to assist him with weight loss.  Problem # 4:  HYPERTENSION (ICD-401.9) Blood pressure control is of course inadequate since he has not been taking appropriate medication.  We have asked him to take lisinopril/hydrochlorothiazide at a dose of 40/25 mg q.d. and amlodipine 5 mg q.d.  The total cost of these prescriptions should be 12 or $13 per month, a sum he agrees he can afford.  We will monitor blood pressure outside the hospital and he will returned for a nursing visit in 2 weeks.  I will see this nice gentleman again in 2 months as we continue to attempt to provide him with adequate medical care.  Patient Instructions: 1)  Your physician recommends that you schedule a follow-up appointment in: 2 months 2)  You have been referred to nurse visit 2 weeks 3)  social services assistance [facility]  [phone] Prescriptions: LISINOPRIL-HYDROCHLOROTHIAZIDE 20-12.5 MG TABS (LISINOPRIL-HYDROCHLOROTHIAZIDE) take 2 tablets by mouth daily  #60 x 3   Entered by:   Teressa Lower RN   Authorized by:   Kathlen Brunswick, MD, [facility]   Signed by:   Teressa Lower RN on 11/22/2009   Method used:   Electronically to        [facility] (retail)       [address]       [place], [place]  [address]       Ph: [phone]       Fax: [phone]   RxID:   [id number] AMLODIPINE BESYLATE 5 MG TABS (AMLODIPINE BESYLATE) Take one tablet by mouth daily  #30 x 3   Entered by:   Teressa Lower RN   Authorized by:   Kathlen Brunswick, MD, [facility]   Signed by:   Teressa Lower RN on 11/22/2009   Method used:   Electronically to        [facility] (retail)       [address]       [place], [place]  [address]       Ph: [phone]       Fax: [phone]   RxID:   [id number] LISINOPRIL-HYDROCHLOROTHIAZIDE 20-12.5 MG TABS (LISINOPRIL-HYDROCHLOROTHIAZIDE) Take 2  tablest by mouth two times a day  #60 x 3   Entered by:   Teressa Lower RN   Authorized by:   Kathlen Brunswick, MD, [facility]   Signed by:   Teressa Lower RN on 11/22/2009   Method used:   Electronically to        [facility] (retail)       [address]       [place], [place]  [address]       Ph: [phone]       Fax: [phone]   RxID:   [id number]

## 2010-07-22 NOTE — Letter (Signed)
Summary: Appointment - Missed  [facility] [place]  [address] [place] [address]   Phone: [phone]  Fax: [phone]     December 09, 2009 MRN: [id number]   RONDEY FALLEN [address], [place]  [address]   Dear Mr. BOEHLKE,  Our records indicate you missed your appointment on      12/06/09 NURSE VISIT                    It is very important that we reach you to reschedule this appointment. We look forward to participating in your health care needs. Please contact us at the number listed above at your earliest convenience to reschedule this appointment.     Sincerely,    Glass blower/designer

## 2010-07-22 NOTE — Miscellaneous (Signed)
Summary: labs cbcd,cmp,urinalysis 01/24/2010  Clinical Lists Changes  Observations: Added new observation of NITRITE URN: neg (01/24/2010 8:25) Added new observation of UROBILINOGEN: 1  (01/24/2010 8:25) Added new observation of PROTEIN UR: neg mg/dL (16/03/9603 5:40) Added new observation of BLOOD UR: neg  (01/24/2010 8:25) Added new observation of KETONES UA: neg mg/dL (98/04/9146 8:29) Added new observation of BILIRUBIN UR: neg  (01/24/2010 8:25) Added new observation of GLUCOSE UA: neg mg/dL (56/21/3086 5:78) Added new observation of PH URINE: 6.5  (01/24/2010 8:25) Added new observation of SPEC GR URIN: 1.021  (01/24/2010 8:25) Added new observation of APPEARANCE U: clear  (01/24/2010 8:25) Added new observation of UA COLOR: yellow  (01/24/2010 8:25) Added new observation of CALCIUM: 9.1 mg/dL (46/96/2952 8:41) Added new observation of ALBUMIN: 4.4 g/dL (32/44/0102 7:25) Added new observation of PROTEIN, TOT: 6.5 g/dL (36/64/4034 7:42) Added new observation of SGPT (ALT): 29 units/L (01/24/2010 8:25) Added new observation of SGOT (AST): 25 units/L (01/24/2010 8:25) Added new observation of ALK PHOS: 59 units/L (01/24/2010 8:25) Added new observation of CREATININE: 1.03 mg/dL (59/56/3875 6:43) Added new observation of BUN: 12 mg/dL (32/95/1884 1:66) Added new observation of BG RANDOM: 83 mg/dL (12/20/1599 0:93) Added new observation of CO2 PLSM/SER: 26 meq/L (01/24/2010 8:25) Added new observation of CL SERUM: 105 meq/L (01/24/2010 8:25) Added new observation of K SERUM: 4.0 meq/L (01/24/2010 8:25) Added new observation of NA: 142 meq/L (01/24/2010 8:25) Added new observation of ABSOLUTE BAS: 0.0 K/uL (01/24/2010 8:25) Added new observation of BASOPHIL %: 0 % (01/24/2010 8:25) Added new observation of EOS ABSLT: 0.1 K/uL (01/24/2010 8:25) Added new observation of % EOS AUTO: 2 % (01/24/2010 8:25) Added new observation of ABSOLUTE MON: 0.4 K/uL (01/24/2010 8:25) Added new  observation of MONOCYTE %: 7 % (01/24/2010 8:25) Added new observation of ABS LYMPHOCY: 1.9 K/uL (01/24/2010 8:25) Added new observation of LYMPHS %: 36 % (01/24/2010 8:25) Added new observation of PLATELETK/UL: 211 K/uL (01/24/2010 8:25) Added new observation of RDW: 13.1 % (01/24/2010 8:25) Added new observation of MCHC RBC: 33.8 g/dL (23/55/7322 0:25) Added new observation of MCV: 84.6 fL (01/24/2010 8:25) Added new observation of HCT: 45.2 % (01/24/2010 8:25) Added new observation of HGB: 15.3 g/dL (42/70/6237 6:28) Added new observation of RBC M/UL: 5.34 M/uL (01/24/2010 8:25) Added new observation of WBC COUNT: 5.2 10*3/microliter (01/24/2010 8:25)

## 2010-07-22 NOTE — Miscellaneous (Signed)
Summary: LABS CMP,LIPIDS,12/25/2008  Clinical Lists Changes  Observations: Added new observation of CALCIUM: 9.2 mg/dL (98/04/9146 82:95) Added new observation of ALBUMIN: 4.2 g/dL (62/13/0865 78:46) Added new observation of PROTEIN, TOT: 6.7 g/dL (96/29/5284 13:24) Added new observation of SGPT (ALT): 30 units/L (12/25/2008 17:00) Added new observation of SGOT (AST): 25 units/L (12/25/2008 17:00) Added new observation of ALK PHOS: 48 units/L (12/25/2008 17:00) Added new observation of CREATININE: 0.99 mg/dL (40/03/2724 36:64) Added new observation of BUN: 13 mg/dL (40/34/7425 95:63) Added new observation of BG RANDOM: 89 mg/dL (87/56/4332 95:18) Added new observation of CO2 PLSM/SER: 26 meq/L (12/25/2008 17:00) Added new observation of CL SERUM: 105 meq/L (12/25/2008 17:00) Added new observation of K SERUM: 3.8 meq/L (12/25/2008 17:00) Added new observation of NA: 142 meq/L (12/25/2008 17:00) Added new observation of LDL: 113 mg/dL (84/16/6063 01:60) Added new observation of HDL: 41 mg/dL (10/93/2355 73:22) Added new observation of TRIGLYC TOT: 119 mg/dL (02/54/2706 23:76) Added new observation of CHOLESTEROL: 178 mg/dL (28/31/5176 16:07)

## 2010-07-22 NOTE — Letter (Signed)
Summary: Appointment - Missed  [facility] [place]  [address], [place] [address]   Phone: [phone]  Fax: [phone]     April 11, 2010 MRN: [id number]   [name] [address] [facility] [address] [place], [place]  [address]   Dear [name],  Our records indicate you missed your appointment on     04/10/10                   with Dr.   Dietrich Pates   .                                    It is very important that we reach you to reschedule this appointment. We look forward to participating in your health care needs. Please contact us at the number listed above at your earliest convenience to reschedule this appointment.     Sincerely,    Glass blower/designer

## 2010-07-22 NOTE — Assessment & Plan Note (Signed)
Summary: F2M  Medications Added LISINOPRIL-HYDROCHLOROTHIAZIDE 20-12.5 MG TABS (LISINOPRIL-HYDROCHLOROTHIAZIDE) take 2 tablets every morning METOPROLOL SUCCINATE 100 MG XR24H-TAB (METOPROLOL SUCCINATE) take 1/2 tablet by mouth once daily      Allergies Added: NKDA  Visit Type:  Follow-up Primary Provider:  [facility].   History of Present Illness: [name] returns to the office as scheduled for continued assessment and treatment of hypertension.  Unfortunately, he has no primary care doctor, but now is enrolled in a Medicare advantage program.  He reports no symptoms despite working outside for long hours in very hot weather.  He continues to be somewhat blas about his health care and has not been taking his medication exactly as ordered, missing some doses of lisinopril/HCT.  Current Medications (verified): 1)  Qvar 80 Mcg/act Aers (Beclomethasone Dipropionate) .... As Needed 2)  Lisinopril-Hydrochlorothiazide 20-12.5 Mg Tabs (Lisinopril-Hydrochlorothiazide) .... Take 2 Tablets Every Morning 3)  Combivent 103-18 Mcg/act Aero (Ipratropium-Albuterol) .... Take 2 Puffs As Needed 4)  Amlodipine Besylate 5 Mg Tabs (Amlodipine Besylate) .... Take One Tablet By Mouth Daily 5)  Metoprolol Succinate 100 Mg Xr24h-Tab (Metoprolol Succinate) .... Take 1/2 Tablet By Mouth Once Daily  Allergies (verified): No Known Drug Allergies  Past History:  PMH, FH, and Social History reviewed and updated.  Review of Systems       The patient complains of weight loss.  The patient denies anorexia, chest pain, syncope, dyspnea on exertion, peripheral edema, prolonged cough, headaches, and abdominal pain.    Vital Signs:  Patient profile:   45 year old male Weight:      351 pounds Pulse rate:   79 / minute BP sitting:   174 / 98  (right arm)  Vitals Entered By: Dreama Saa, CNA (January 24, 2010 2:56 PM)  Physical Exam  General:    Obese; well developed; no acute  distress:   Neck-No JVD; no carotid bruits: Lungs-No tachypnea, no rales; no rhonchi; no wheezes; prolonged expiratory phase Cardiovascular-normal PMI; normal S1 and S2: Abdomen-BS normal; soft and non-tender without masses or organomegaly:  Musculoskeletal-No deformities, no cyanosis or clubbing: Neurologic-Normal cranial nerves; symmetric strength and tone:  Skin-Warm, no significant lesions: Extremities-Nl distal pulses; trace edema   Impression & Recommendations:  Problem # 1:  CONGESTIVE HEART FAILURE (ICD-428.0) CHF is well controlled with adequate management of blood pressure.  Problem # 2:  OBESITY, MORBID (ICD-278.01) Patient is congratulated on his 7 pound weight loss and encouraged to continue being active and restricting caloric intake.  Problem # 3:  HYPERTENSION (ICD-401.9) Blood pressure control is improved, but certainly not optimal.  Metoprolol succinate 50 mg q.d. will be added to his medical regime and increased as necessary and feasible.  He will return in one month for a blood pressure visit with the cardiology nurses and in 2 months for followup with me.  Other Orders: T-Comprehensive Metabolic Panel [phone]) T-CBC w/Diff [phone]) T-TSH [phone]) T-Urinalysis [phone])  Patient Instructions: 1)  Your physician recommends that you schedule a follow-up appointment in: 1 month for blood pressure check and 2 months with MD 2)  Your physician recommends that you return for lab work in: TODAY 3)  Your physician has recommended you make the following change in your medication: Start taking 2 tablets of Lisinopril/HCT 20/12.5mg  every morning and 1/2 tablet of Metoprolol 100mg    4)  Your physician has requested that you regularly monitor and record your blood pressure readings at home.  Please use the same machine at  the same time of day to check your readings and record them to bring to your follow-up visit in 1 month. Prescriptions: METOPROLOL  SUCCINATE 100 MG XR24H-TAB (METOPROLOL SUCCINATE) take 1/2 tablet by mouth once daily  #15 x 2   Entered by:   Larita Fife Via LPN   Authorized by:   Kathlen Brunswick, MD, [facility]   Signed by:   Larita Fife Via LPN on 14/78/2956   Method used:   Electronically to        [facility]  [address] [id number] (retail)       [address]       [place], [place]  [address]       Ph: [phone] or [phone]       Fax: [phone]   RxID:   [id number]

## 2010-09-13 ENCOUNTER — Other Ambulatory Visit: Payer: Self-pay | Admitting: Cardiology

## 2010-10-02 LAB — CBC
Platelets: 163 10*3/uL (ref 150–400)
RBC: 5.16 MIL/uL (ref 4.22–5.81)
WBC: 4.6 10*3/uL (ref 4.0–10.5)

## 2010-10-02 LAB — URINALYSIS, ROUTINE W REFLEX MICROSCOPIC
Bilirubin Urine: NEGATIVE
Glucose, UA: NEGATIVE mg/dL
Hgb urine dipstick: NEGATIVE
Ketones, ur: NEGATIVE mg/dL
Protein, ur: NEGATIVE mg/dL
Specific Gravity, Urine: 1.025 (ref 1.005–1.030)
pH: 5.5 (ref 5.0–8.0)

## 2010-10-02 LAB — BRAIN NATRIURETIC PEPTIDE: Pro B Natriuretic peptide (BNP): 50.4 pg/mL (ref 0.0–100.0)

## 2010-10-02 LAB — BASIC METABOLIC PANEL
BUN: 14 mg/dL (ref 6–23)
Calcium: 8.3 mg/dL — ABNORMAL LOW (ref 8.4–10.5)
Chloride: 101 mEq/L (ref 96–112)
Creatinine, Ser: 1.02 mg/dL (ref 0.4–1.5)
GFR calc Af Amer: >60 mL/min (ref >60)
GFR calc non Af Amer: >60 mL/min (ref >60)
Glucose, Bld: 95 mg/dL (ref 70–99)
Potassium: 3.3 mEq/L — ABNORMAL LOW (ref 3.5–5.1)

## 2010-10-02 LAB — DIFFERENTIAL
Basophils Relative: 0 % (ref 0–1)
Eosinophils Absolute: 0 10*3/uL (ref 0.0–0.7)
Eosinophils Relative: 0 % (ref 0–5)
Monocytes Absolute: 0.6 10*3/uL (ref 0.1–1.0)
Monocytes Relative: 13 % — ABNORMAL HIGH (ref 3–12)
Neutrophils Relative %: 65 % (ref 43–77)

## 2010-11-04 NOTE — Assessment & Plan Note (Signed)
[facility]                       [facility] OFFICE NOTE   NAME:Fitzpatrick, Samuel DUER                        MRN:          [id number]  DATE:01/30/2008                            DOB:          [birth year]    CARDIOLOGIST:  Gerrit Friends. Dietrich Pates, MD, [facility]   PRIMARY CARE PHYSICIAN:  None.   REASON FOR VISIT:  Posthospitalization followup.   HISTORY OF PRESENT ILLNESS:  [name] is a 45 year old male patient  who was seen by Dr. Jens Som at [facility] in June 2009  secondary to chest pain and shortness of breath.  His chest x-ray did  show evidence of pulmonary vascular congestion.  His BNP was slightly  elevated at 169.  His blood pressure was uncontrolled.  His cardiac  markers were negative.  His EKG showed nonspecific lateral T-wave  changes.  The patient was set up for an echocardiogram, which revealed  normal LV function with an EF of 55-65%.  His LV wall thickness was  markedly increased.  It was felt that the patient likely had mild  congestive heart failure and asthma contributing to his chest pain and  shortness of breath.  Recommendations were made to treat his blood  pressure.  He was set up for posthospitalization followup, but missed  that appointment and returns to the office today.   In the office today, the patient notes that since discharge from the  hospital, he has had quite significant episodes of asthma exacerbations.  He felt this was related to his diltiazem and stopped it.  Since he  stopped the medication, his asthma symptoms have improved.  He admits  compliance with lisinopril and chlorthalidone.  He takes Combivent as  needed.  He does note some chest discomfort.  This is described as a  tightness with exertion.  This usually occurs in the heat of the day.  He notes associated shortness of breath, nausea, and diaphoresis.  He  feels lightheaded at times as well.  He really is not that active  indoors and has not  particularly noted symptoms indoors.  He has only  noted these symptoms over the last several weeks.  There does not seem  to be a change in the pattern.  The symptoms he had at the at [facility]  have resolved.  He denies orthopnea or PND.  He does have an area of  swelling on his medial ankle on the right that has been present for many  years without change.   CURRENT MEDICATIONS:  1. Lisinopril 40 mg daily.  2. Chlorthalidone 25 mg daily.  3. Combivent MDI p.r.n.   PHYSICAL EXAMINATION:  GENERAL:  He is a well-nourished, well-developed,  obese male in no acute distress.  VITAL SIGNS:  Blood pressure is 152/120, repeat blood pressure is  160/109, pulse 80, and weight 345 pounds.  HEENT:  Normal.  NECK:  Without obvious JVD.  CARDIAC:  Normal S1 and S2.  Regular rate and rhythm.  I cannot  appreciate any murmurs.  LUNGS:  Clear to auscultation bilaterally.  No wheezing or rales.  ABDOMEN:  Soft  and nontender.  EXTREMITIES:  With trace to 1+ edema bilaterally.  NEUROLOGIC:  He is alert and oriented x3.  Cranial nerves II through XII  grossly intact.   Electrocardiogram reveals sinus rhythm with a heart rate of 85, normal  axis, T-wave inversions in I and aVL as well as V5 and V6, occasional  PACs, left atrial enlargement.   IMPRESSION:  1. Uncontrolled hypertension.  2. Chest pain.  3. Chronic diastolic congestive heart failure.      a.     Probable hypertensive heart disease.  4. History of hyperlipidemia.      a.     Lipids on December 08, 2007; total cholesterol 177,       triglycerides 92, HDL 31, and LDL 128.  5. Asthma.  6. Gastroesophageal reflux disease.  7. Sleep apnea.  8. Remote tobacco abuse (he admits to few cigarettes every few days).  9. Family history of coronary artery disease (father died of a      myocardial infarction in his early 83s).   PLAN:  1. [name] returns for followup.  His blood pressure is      uncontrolled.  From a heart failure  standpoint, he seems to be      optivolemic.  He admits to compliance with his other medicines, but      had to stop his diltiazem secondary to asthma exacerbation.  The      etiology of his symptoms in relation to his diltiazem is unclear.      In any event, since his symptoms improved off this medication, we      will leave this off his medical regimen.  For better control of his      blood pressure, I have elected to place him on clonidine 0.1 mg      b.i.d.  2. The patient will return next week for a blood pressure check and      heart rate check with the nurse.  3. We will check followup labs with a BMET and a BNP.  It does not      appear that he had a BMET since he was placed on lisinopril and      chlorthalidone during his recent hospitalization.  4. We will refer him to a primary care physician.  He needs better      control and follow up for his asthma.  I have explained to him the      dangers of not treating his asthma appropriately.  It sounds as      though he will likely require a daily inhaler in addition to his      p.r.n. inhalers.  I explained this to him.  5. The patient has had some symptoms of exertional chest pain      recently.  Although these are atypical, he does have some risk      factors for coronary artery disease.  The patient will be set up      for a stress Myoview study to rule out ischemic heart disease.  We      will try to arrange this in the next couple of weeks to allow time      to better control his blood pressure prior to his test.  6. The patient will be brought back in followup with Dr. Dietrich Pates next      month.  7. Of note, I have asked the patient to start on aspirin 81 mg a day.  Tereso Newcomer, PA-C  Electronically Signed      Gerrit Friends. Dietrich Pates, MD, [facility]  Electronically Signed   SW/MedQ  DD: 01/30/2008  DT: 01/31/2008  Job #: [id number]

## 2010-11-04 NOTE — Assessment & Plan Note (Signed)
[facility]                       [name]   NAME:[name], [name]                        MRN:          [id number]  DATE:12/11/2008                            DOB:          1965-07-11    [name] returns to the office for continued assessment and treatment  of hypertension, hyperlipidemia, and sleep apnea.  Since he was last  seen, he underwent a formal sleep study that demonstrated severe apnea  with oxygen saturation down to 74% at night.  He had a good response to  positive pressure ventilation.  His blood pressure has not been assessed  since his last visit.  He is using a beclomethasone inhaler on a p.r.n.  basis for exacerbations of asthma.  He has had no symptoms to suggest  recurrent congestive heart failure.  He has increased his daily activity  and lost some weight.   CURRENT MEDICATIONS:  1. QVAR 80 mcg 1 puff b.i.d.  2. Lisinopril and hydrochlorothiazide 20/12.5 mg 1 b.i.d.   PHYSICAL EXAMINATION:  GENERAL:  Overweight pleasant gentleman in no  acute distress.  VITAL SIGNS:  The weight is 348, 10 pounds less than in January.  Blood  pressure 155/100, heart rate 90 and regular, respirations 12 and  unlabored.  NECK:  No jugular venous distention; normal carotid upstrokes without  bruits.  LUNGS:  Clear without any increase in the expiratory phase or expiratory  wheezing.  CARDIAC:  Normal first and second heart sounds.  ABDOMEN:  Soft and nontender; normal bowel sounds; no organomegaly.  EXTREMITIES:  Trace edema; large fleshy tumor over the lateral aspect of  the left ankle, possibly a lipoma.   Most recent laboratory is from May at which time his chemistry profile  was normal.   IMPRESSION:  [name] is doing fairly well overall.  Hypertension is  suboptimally controlled.  Amlodipine 5 mg daily will be added to his  medical regime.  I have no record of previous lipid profiles - one will  be obtained.   We will refer this nice gentleman to Dr. Juanetta Gosling for  assessment and treatment of his sleep apnea and possibly continuing  primary care, since he has no current physician.  I will see him again  in 2 months.     Gerrit Friends. Dietrich Pates, MD, [facility]  Electronically Signed    RMR/MedQ  DD: 12/11/2008  DT: 12/12/2008  Job #: [id number]   cc:   Ramon Dredge L. Juanetta Gosling, M.D.

## 2010-11-04 NOTE — H&P (Signed)
[name], [name]                 ACCOUNT NO.:  [id number]   MEDICAL RECORD NO.:  [id number]          PATIENT TYPE:  OBV   LOCATION:  A316                          FACILITY:  [facility]   PHYSICIAN:  Gardiner Barefoot, MD    DATE OF BIRTH:  04-17-1966   DATE OF ADMISSION:  12/07/2007  DATE OF DISCHARGE:  LH                              HISTORY & PHYSICAL   PRIMARY CARE PHYSICIAN:  Unassigned.   CHIEF COMPLAINT:  Shortness of breath and chest discomfort.   HISTORY OF PRESENT ILLNESS:  This is a 45 year old male with history of  obesity and asthma, who presents here with 4 days of shortness of breath  and pleuritic type chest pain on the left side, relieved predominately  by albuterol therapy, but persists a short time after therapy.  The  patient denies any diaphoresis.  The patient has been in the emergency  room, now this his third visit in the last several days, and was started  on beta-blocker for his increased blood pressure which was noted here.  The patient does not see a doctor now, and although he has a history of  hypertension has not been taking any medications.  The patient did have  a echocardiogram 2 years ago, which did show some left ventricular  hypertrophy, likely secondary to his hypertension.  The patient also  reports a productive cough, however, denies any fever, sick contacts or  other issues.   PAST MEDICAL HISTORY:  1. Hypertension with concentric left LVH.  2. Asthma.  3. History of CHF.   MEDICATIONS:  1. Albuterol.  2. Metoprolol 50 mg b.i.d., started in the emergency room yesterday   ALLERGIES:  NO KNOWN DRUG ALLERGIES.   SOCIAL HISTORY:  The patient reports 1-2 cigarettes weekly, occasional  alcohol and denies any drugs.   FAMILY HISTORY:  No early cardiac death.   REVIEW OF SYSTEMS:  Negative, except as per the history of present  illness.   PHYSICAL EXAMINATION:  VITAL SIGNS:  Temperature is 99.5, pulse 81,  respirations 18-22, blood pressure is  158/103.  O2 sat is 100%.  GENERAL:  The patient is awake, alert and oriented x3 and appears in no  acute distress.  HEENT:  Sunglasses are in place.  No thrush.  CARDIOVASCULAR:  Distant with regular rate and rhythm.  No murmurs  appreciated.  LUNGS:  Distant.  Clear to auscultation bilaterally with no wheezes.  ABDOMEN:  Morbidly obese.  Soft, nontender, nondistended.  Positive  bowel sounds and no hepatosplenomegaly appreciated.  EXTREMITIES:  With 1+ edema.  SKIN:  Notable neck with darkening acanthosis nigricans.   LABORATORY DATA:  BNP of 81, troponin I less 0.05, CK-MB is 7.1, sodium  138, potassium 3.4, chloride 104, bicarb 31, BUN 12, creatinine is 0.99,  glucose 102, WBC is 5.2, hemoglobin 13, platelets 194.  Chest x-ray with  cardiomegaly with edema.   ASSESSMENT/PLAN:  A 45 year old male with shortness of breath, pleuritic  chest pain and uncontrolled hypertension.   1. Shortness of breath.  At this time, the patient does not have any  significant wheezing, and I do not seen any indication to start      steroid therapy on him with no significant wheezing appreciated.      Will continue with albuterol and Atrovent inhalers as the patient      does have symptomatic relief with this.  2. Hypertension.  Will start the patient on a calcium channel blocker      and we will discontinue his metoprolol secondary to his underlying      asthma.  Also will check cardiac enzymes.  Although his chest pain      does appear to be pleuritic, the patient is a poor historian and it      is unclear if his chest pain is respiratory or cardiac in origin.  3. Obesity.  Will have the patient receive diet education and will      advise the patient to obtain a primary care physician, likely will      need evaluation for obstructive sleep apnea, as he has been seen in      the emergency room here to have significant snoring.      Gardiner Barefoot, MD  Electronically Signed     RWC/MEDQ   D:  12/07/2007  T:  12/07/2007  Job:  [id number]

## 2010-11-04 NOTE — Group Therapy Note (Signed)
NAMEJOSEANDRES, MAZER                 ACCOUNT NO.:  [id number]   MEDICAL RECORD NO.:  [id number]          PATIENT TYPE:  OBV   LOCATION:  A316                          FACILITY:  [facility]   PHYSICIAN:  Gardiner Barefoot, MD    DATE OF BIRTH:  1966-03-05   DATE OF PROCEDURE:  DATE OF DISCHARGE:                                 PROGRESS NOTE   PRIMARY CARE PHYSICIAN:  Unassigned   SUBJECTIVE:  The patient continues to feel well with the nebulizer  treatments, and he reports no more chest pain, and is not feeling short  of breath now.   OBJECTIVE:  VITAL SIGNS:  Temperature 98.2, pulse 82, respirations 20,  blood pressure has been ranging from 140-176/84-115 and O2 saturations  92% on room air.  GENERAL:  The patient is awake, alert and oriented x3, and appears in no  acute distress.  CARDIOVASCULAR:  Distant with regular rate and rhythm.  No murmurs,  rubs, or gallops.  LUNGS:  Without wheezes, clear to auscultation bilaterally.  ABDOMEN:  Morbidly obese, nontender, nondistended, with positive bowel  sounds.  EXTREMITIES:  With mild edema.   LABORATORY DATA INCLUDES:  A total CK trending from 457 trending down to  378, CK/MB trend is 8.9 to 7.0, and relative index remains within normal  limits.  Troponin has remained within normal limits.  Echocardiogram  pending at this time.   ASSESSMENT/PLAN:  1. Pleuritic chest pain.  This been relieved with inhalers.  The      patient is feeling much improved.  We will continue with inhalers,      and the patient will need to continue that at home.  2. Cardiovascular.  Appreciate cardiology input management of Mr.      [name].  His blood pressure has improved on his therapy, now on      Cardizem and lisinopril along with Lasix. The echocardiogram is      pending at this time, and further management dependent on the      results of the echocardiogram.  Will await those results and      further cardiology input.  3. Obstructive sleep apnea.  The  patient will need evaluation as an      outpatient as he likely has significant obstructive sleep apnea.      Will also be alert to whether or not he has pulmonary hypertension      on his echocardiogram which would also be suggestive of this.  4. Gastroesophageal reflux disease. No issues at this time.  5. As prophylaxis, the patient is on deep venous thrombosis      prophylaxis.   DISPOSITION:  The patient will likely be discharged in 1-2 days,  depending on the results of his cardiovascular workup.      Gardiner Barefoot, MD  Electronically Signed     RWC/MEDQ  D:  12/08/2007  T:  12/08/2007  Job:  [id number]

## 2010-11-04 NOTE — Procedures (Signed)
NAMEELIEZER, [name]                 ACCOUNT NO.:  [id number]   MEDICAL RECORD NO.:  [id number]          PATIENT TYPE:  OUT   LOCATION:  SLEE                          FACILITY:  [facility]   PHYSICIAN:  Kofi A. Gerilyn Pilgrim, M.D. DATE OF BIRTH:  [birth year]   DATE OF PROCEDURE:  07/13/2008  DATE OF DISCHARGE:  07/13/2008                             SLEEP DISORDER REPORT   REFERRING PHYSICIAN:  Tereso Newcomer, PA-C   INDICATION:  This is a 45 year old man who presents with snoring  hypersomnia has been evaluated for obstructive sleep apnea syndrome.   MEDICATIONS:  None.   Epworth sleepiness scale is 16 and BMI 47.   ARCHITECTURAL SUMMARY:  This is a split night recording with the first  part being a diagnostic and second at titration.  The total recording  time in the diagnostic is 127 minutes and 331 in the titration.  The  sleep latency is 6 minutes.  There is no REM in the diagnostic.  The  titration of REM latency is 14 minutes and sleep latency is 16 minutes.   RESPIRATORY SUMMARY:  The patients was initially observed and recorded  during the diagnostic portion.  The baseline oxygen saturation is 95%.  Lowest saturation 74% during REM sleep.  The diagnostic AHA is 105.  The  patient was titrated between pressures 5 and 18.  The optimal pressure  is 17 with elimination of almost all events and the patient tolerated  this pressure well.   LIMB MOVEMENT SUMMARY:  The patient did have a large amount of limb  movements when he was started on positive pressure.  The index during  the diagnostic portion is 0 and 329 during the titration portion.   ELECTROCARDIOGRAM INDEX:  The average heart rate is 87 with premature  ventricular contractions and premature atrial contractions observed.  These were isolated, however.   IMPRESSION:  1. Severe obstructive sleep apnea syndrome, which responded well to      continuous positive airway pressure of 17.  2. Severe periodic limb movement disorder of  sleep.   RECOMMENDATIONS:  Consider dopamine, agonist such as ReQuip or Mirapex  to address the periodic limb movement disorder.   Thanks for this referral.      Kofi A. Gerilyn Pilgrim, M.D.  Electronically Signed     KAD/MEDQ  D:  07/20/2008  T:  07/21/2008  Job:  [id number]

## 2010-11-04 NOTE — Discharge Summary (Signed)
NAMESTEPEHN, [name]                 ACCOUNT NO.:  [id number]   MEDICAL RECORD NO.:  [id number]          PATIENT TYPE:  OBV   LOCATION:  A316                          FACILITY:  [facility]   PHYSICIAN:  Osvaldo Shipper, MD     DATE OF BIRTH:  28-Jun-1965   DATE OF ADMISSION:  12/07/2007  DATE OF DISCHARGE:  06/18/2009LH                               DISCHARGE SUMMARY   Please review H&P dictated by Dr. Luciana Axe as well as the progress note  dictated by Dr. Luciana Axe this morning for details regarding the patient's  presenting illness and hospital stay.   DISCHARGE DIAGNOSES:  1. Pleuritic chest pain related to asthma, improved.  2. Poorly controlled hypertension.  3. Left ventricular hypertrophy on echocardiogram and EKG.  4. Possible obstructive sleep apnea.   BRIEF HOSPITAL COURSE:  Briefly, this is a 45 year old African American  male who presented to the hospital complaining of shortness of breath  and pleuritic chest pain.  The patient was felt to have a mild asthma  exacerbation which was causing the pleuritic pain.  His chest x-ray  showed cardiomegaly with slight pulmonary vascular congestion.  No other  acute abnormalities.  It was repeated and it showed again cardiomegaly  with mild interstitial edema.  The patient was put on Lasix.  His blood  pressure was poorly controlled.  He was started on antihypertensive  agents.  He was also put on inhalers.  His labs were remarkable only for  hypokalemia, otherwise quite unremarkable.  He was seen by cardiologist,  Dr. Dietrich Pates, who ordered echocardiogram which showed EF of 55-65% with  some evidence for left ventricular hypertrophy.  It was recommended that  the patient be on aspirin, be on antihypertensive agents.  He needs to  follow up with cardiology in a month's time.  The patient has improved  the meantime.  His chest pain and shortness of breath have resolved.  His potassium was replaced this morning.  He had a physical examination  done  by Dr. Luciana Axe this morning.  I also listened to his lungs which was  clear to auscultation.  The patient was very keen on going home so I  have gone ahead and discharged him.   DISCHARGE MEDICATIONS:  1. Combivent MDI 2 puffs 4 times a day.  2. Aspirin 81 mg daily.  3. Chlorthalidone 25 mg daily.  4. Diltiazem CD 240 mg daily.  5. Lisinopril 40 mg daily.  6. KCl 20 mEq daily.  7. Prilosec 20 mg daily.   He has been provided with a phone number for the local doctors to set up  PMD.  He has also been told that he needs a sleep study, however, he  tells me he has had that in the past, although he is not aware of the  results.  I would prefer the cardiologist follow-up with this because it  would be more prudent to do.   DIET:  Heart-healthy.   PHYSICAL ACTIVITY:  Restrictions.   TOTAL TIME ON THIS DISCHARGE:  35 minutes.      Osvaldo Shipper, MD  Electronically Signed     GK/MEDQ  D:  12/08/2007  T:  12/08/2007  Job:  [id number]   cc:   Gerrit Friends. Dietrich Pates, MD, [facility]  [address]  [place], [place] [address]

## 2010-11-04 NOTE — Consult Note (Signed)
[name], [name]                 ACCOUNT NO.:  [id number]   MEDICAL RECORD NO.:  [id number]          PATIENT TYPE:  OBV   LOCATION:  A316                          FACILITY:  [facility]   PHYSICIAN:  Madolyn Frieze. Jens Som, MD, FACCDATE OF BIRTH:  1965-10-07   DATE OF CONSULTATION:  DATE OF DISCHARGE:                                 CONSULTATION   [name] is a 45 year old male with a past medical history of  hypertension, hyperlipidemia, asthma, gastroesophageal reflux disease,  and sleep apnea, I am asked to evaluate for dyspnea and chest pain.  Note, he typically does not have dyspnea on exertion, orthopnea, or  exertional chest pain.  He occasionally has pedal edema.  Over the past  3 days, he has been seen in the emergency room with complaints of  increased shortness of breath as well as chest pain.  The chest pain is  described as a tightness.  It is predominantly over the left side of  his chest.  It increases with lying flat.  It does not radiate.  There  is no associated nausea, vomiting, or diaphoresis, but he is having  shortness of breath with this.  His symptoms do improve with sitting up.  He was felt to have asthma and all of his symptoms did improve with  breathing treatments in the emergency room.  However, they returned  later in the day.  Because of his persistent symptoms, he returned  yesterday and was admitted by the primary care service and we were asked  to further evaluate.   His present medications include, albuterol, and Prilosec at home.  He  was not taking an antihypertensive.   He has no known drug allergies.   SOCIAL HISTORY:  He smokes 5 cigarettes per month by his report.  He  occasionally consumes alcohol.  He denies any drug use.   FAMILY HISTORY:  Negative for coronary artery disease.   PAST MEDICAL HISTORY:  Significant for hypertension and hyperlipidemia.  There is no diabetes mellitus.  He does have a history of sleep apnea.  He also has a history  of asthma.  There is gastroesophageal reflux  disease.  He has had no previous surgeries.   REVIEW OF SYSTEMS:  He denies any headaches or fevers or chills.  He has  had a productive cough, but there is no hemoptysis.  There is no  dysphagia, odynophagia, melena, or hematochezia.  There is no dysuria or  hematuria.  There is no rash or seizure activity.  There is no PND, but  he has had mild orthopnea, as well as pedal edema.  The remaining  systems are negative.   PHYSICAL EXAMINATION:  VITAL SIGNS:  Shows a blood pressure of 161/107  and his pulse is 85.  He is afebrile with a temperature of 97.7.  GENERAL:  He is well-developed and obese.  He is in no acute distress at  present.  Does not appear to be depressed.  SKIN:  Skin is warm and dry.  There is no peripheral clubbing.  BACK:  His back is normal.  HEENT:  Normal with normal eyelids.  NECK:  Supple with normal upstroke bilaterally and I cannot appreciate  bruits.  There is no thyromegaly noted.  CHEST:  Shows mildly diminished breath sounds throughout.  There is no  expiratory wheeze noted.  CARDIOVASCULAR:  Regular rate and rhythm.  Normal S1 and S2.  There are  no murmurs or rubs noted.  He has a loud gallop.  ABDOMEN:  Nontender, nondistended.  Positive bowel sounds.  No  hepatosplenomegaly.  No mass appreciated.  There is no abdominal bruit.  He has 2+ femoral pulses bilaterally.  No bruits.  EXTREMITIES:  Showed trace to 1+ ankle edema.  He has 2+ dorsalis pedis  pulses bilaterally.  NEUROLOGIC:  Grossly intact.   LABORATORY DATA:  Laboratories show hemoglobin/hematocrit of 14.4 and  41.8 respectively.  His sodium is 138 with potassium of 3.4.  His BUN  and creatinine 12 and 0.99 respectively.  Liver functions are normal.  His initial BNP was elevated at 169.  Cardiac markers are negative x2.  A chest x-ray on admission showed cardiomegaly with slight pulmonary  vascular congestion.  His electrocardiogram shows a  normal sinus rhythm  with nonspecific lateral T-wave changes.  There is occasional PAC noted.   DIAGNOSES:  1. Chest pain - This appears to be secondary to congestive heart      failure/asthma as there is a pleuritic and positional component.      His enzymes are negative.  We will plan an echocardiogram to      quantify his left ventricular function.  If his left ventricular      function is normal and there are no wall motion abnormalities, then      we would not pursue further ischemia evaluation.  2. Dyspnea - This appears to be related to mild congestive heart      failure superimposed on underlying asthma.  Also, note that his      blood pressure is 161/107.  We are planning to check an      echocardiogram to quantify his left ventricular function, as well      as his thyroid stimulating hormone.  If his left ventricular      function is reduced, then he will need an ischemia evaluation.      Otherwise, we would recommend control in his blood pressure.  We      will discontinue his Norvasc and instead treat with Cardizem.  If      his left ventricular function is reduced, then we could change this      to a beta blocker.  We will also add lisinopril at low-dose as well      as low-dose diuretic as he appears to be volume overloaded.  We      need to follow his renal function closely.  His blood pressure      medications can be advanced as needed to control his blood      pressure.  3. Asthma - He will continue on his bronchodilators and this will be      managed by the primary care service.  4. Hypertension - We are adjusting his blood pressure medicines as      described in #2.  5. Tobacco abuse - He will need to discontinue this.  6. Sleep apnea - Management per the primary care service.  7. Gastroesophageal reflux disease.  We will be happy to follow      otherwise in the hospital.  Madolyn Frieze Jens Som, MD, [facility]  Electronically Signed     BSC/MEDQ  D:  12/07/2007   T:  12/08/2007  Job:  [id number]

## 2010-11-04 NOTE — Assessment & Plan Note (Signed)
[facility]                       [place] CARDIOLOGY OFFICE NOTE   NAME:[name], KARY COLAIZZI                        MRN:          [id number]  DATE:02/29/2008                            DOB:          [birth year]    Mr. Donigan returns to the office for continued assessment and treatment  of hypertension, asthma, and possible hypertensive heart disease.  Since  his last visit, he has done fairly well.  He notes occasional episodes  of dyspnea relieved with use of his Combivent inhaler.  Blood pressure  control has been improved, but systolics as high as 190 recorded by his  family.  He notes no edema, orthopnea, nor PND.   Current medications include clonidine 0.1 mg b.i.d. that he is only  taking daily, lisinopril/hydrochlorothiazide 20/12.5 mg b.i.d., and  Combivent inhaler 2 puffs q.6 h. and p.r.n.   PHYSICAL EXAMINATION:  GENERAL:  Pleasant overweight gentleman.  VITAL SIGNS:  The weight is 345, unchanged.  Blood pressure 140/90 with  some uncertainty due to frequent prematures.  Heart rate 60.  Respirations 14.  NECK:  No jugular venous distention.  LUNGS:  Somewhat decreased breath sounds without wheeze or marked  prolongation of the expiratory phase.  CARDIAC:  Normal first and second heart sounds.  Fourth heart sound  present.  ABDOMEN:  Soft and nontender; no organomegaly.  EXTREMITIES:  No edema.   IMPRESSION:  Mr. Correia is doing generally well.  We will try to  schedule all of his diuretic in the mornings to minimize nocturia.  He  will start taking clonidine as prescribed 0.1 mg b.i.d. and keep track  of his blood pressures.  We will reassess this nice gentleman in 4  months.     Gerrit Friends. Dietrich Pates, MD, [facility]  Electronically Signed    RMR/MedQ  DD: 02/29/2008  DT: 03/01/2008  Job #: [id number]

## 2010-11-04 NOTE — Assessment & Plan Note (Signed)
[facility]                       [place] CARDIOLOGY OFFICE NOTE   NAME:[name], [name]                        MRN:          [id number]  DATE:07/03/2008                            DOB:          11/30/1965    CARDIOLOGIST:  Gerrit Friends. Dietrich Pates, MD, [facility]   PRIMARY CARE PHYSICIAN:  None.   REASON FOR VISIT:  A 60-month followup.   HISTORY OF PRESENT ILLNESS:  [name] is a 45 year old male with a  history of hypertensive heart disease and diastolic heart failure in the  past, as well as asthma.  He returns to the office today for followup.  He had normal LV function by an echocardiogram done in June 2009 with an  EF of 55-60%.  He had a Myoview study done in August 2009 that  demonstrated no ischemia or infarction.  In the office today, he notes  that he takes his lisinopril sometimes 1 tablet a day and sometimes 2  tablets a day.  He discontinued the clonidine himself, because he felt  like it was making him wheeze more.  He continues to note shortness of  breath from time to time.  It is somewhat difficult to ascertain exactly  when he does feel short of breath.  It sounds as though he is describing  more symptoms of wheezing and asthma more than anything else.  He does  sleep on a couple of pillows.  This is fairly chronic without change.  He does awaken some times with shortness of breath which is consistent  with paroxysmal nocturnal dyspnea.  Again, this has been ongoing for  quite some time without significant change.  He denies chest pain other  than some tightness in his chest when his asthma is bothering him.  Usually, Combivent seems to help.  He does note some pedal edema from  time to time.   CURRENT MEDICATIONS:  1. Combivent p.r.n.  2. QVAR 80 mcg b.i.d.  3. Lisinopril/HCTZ 20/12.5 mg 1-2 tablets daily.   PHYSICAL EXAMINATION:  GENERAL:  He is a well-nourished, well-developed  obese male in acute distress.  VITAL SIGNS:  Blood  pressure is 140/90, pulse 78, and weight 358 pounds.  He has gained 13 pounds since we saw him last.  HEENT:  Normal.  NECK:  I cannot appreciate JVD.  CARDIAC:  Normal S1 and S2.  Regular rate and rhythm without murmur.  LUNGS:  Clear to auscultation bilaterally with decreased breath sounds  bilaterally.  No wheezing.  No rales.  ABDOMEN:  Soft and nontender.  EXTREMITIES:  With trace edema bilaterally.  NEUROLOGIC:  He is alert and oriented x3.  Cranial nerves II-XII are  grossly intact.   ASSESSMENT AND PLAN:  1. Hypertensive heart disease with evidence of diastolic heart failure      in the past with overall preserved left ventricular function with      an ejection fraction of 55-65% by echocardiogram in June 2009.  As      noted previously, he had a nonischemic Myoview study in August      2009.  On exam, he  seems to be optivolemic.  I do not think he      needs further diuresis at this time.  He needs better blood      pressure control, but he takes his lisinopril/hydrochlorothiazide      according to his own scheduling.  I have asked him to start taking      his lisinopril/hydrochlorothiazide 2 tablets a day and we will      change his prescription, so that he can start taking 1 tablet a      day.  We will check a BMET to follow up on his renal function and      potassium.  I will also check a BNP to ensure that his fluid status      is remaining stable.  Of note, his last BNP was 47 in August 2009.  2. Asthma.  He continues to smoke cigarettes from time to time.  This      should be discontinued.  He seems to have some relief when he takes      his Combivent.  We will try to establish him with a primary care      physician.  3. Fatigue and snoring.  He had apparently been diagnosed with sleep      apnea in the past or at least it was suspected.  He has never been      formally evaluated.  We will refer him for formal sleep study to      further evaluate for sleep apnea.    DISPOSITION:  Follow up in 3 months with me or Dr. Dietrich Pates.  At that  time, we will need to review his lipids again and set up followup  testing.  We will also need to address his obesity.      Tereso Newcomer, PA-C  Electronically Signed      Gerrit Friends. Dietrich Pates, MD, [facility]  Electronically Signed   SW/MedQ  DD: 07/03/2008  DT: 07/04/2008  Job #: [id number]

## 2010-11-07 NOTE — H&P (Signed)
[name], [name]                 ACCOUNT NO.:  [id number]   MEDICAL RECORD NO.:  [id number]          PATIENT TYPE:  INP   LOCATION:  A210                          FACILITY:  [facility]   PHYSICIAN:  Osvaldo Shipper, MD     DATE OF BIRTH:  Jun 18, 1966   DATE OF ADMISSION:  10/30/2005  DATE OF DISCHARGE:  LH                                HISTORY & PHYSICAL   The patient does not have a primary care doctor.   ADMITTING DIAGNOSES:  1.  Chest pain, rule out acute coronary syndrome.  2.  Abnormal electrocardiogram.  3.  Uncontrolled hypertension.  4.  Morbid obesity.  5.  Possible sleep apnea.   CHIEF COMPLAINT:  Chest tightness, shortness of breath for the past 3 hours.   HISTORY OF PRESENT ILLNESS:  The patient is a 45 year old, African-American  male with morbid obesity, hypertension, not on treatment, who was feeling  well until about 3:00 a.m. this morning, when he was sitting on his bed  watching TV when he experienced chest tightness mostly on the right side of  his chest which was 7/10 in intensity.  The patient did not feel the pain  was radiating anywhere.  He did have palpitations.  He did experience  shortness of breath and some diaphoresis.  He did not have any nausea or  vomiting.  He had chronic cough.  He did not have any fever or chills.  The  patient at that time decided that pain was not going away and to come into  the ED.  The patient was given some breathing treatments in the ED, and  currently he denies any chest tightness.  He mentioned that last evening at  about 4:00 he was mowing the lawn and was pulling some weeds from his  garden, but at that time he did not experience any shortness of breath.  He  says he has had this chest tightness and shortness of breath in the past as  well which always improves with breathing treatments.  He had experienced  some wheezing in the past as well, but he did not have any at this time.  He  does carry a diagnosis of asthma, but  he has never had any pulmonary  function tests.   He also gives a history of snoring in the night while sleeping.  He gives a  history of daytime somnolence as well.   MEDICATIONS AT HOME:  The patient is not taking medications, which include  inhaler as well.  Occasionally, he has to take Tylenol for headaches.   ALLERGIES:  No known drug allergies.   PAST MEDICAL HISTORY:  He says he has been diagnosed with asthma, though he  does not know who diagnosed it, and he has never had a PFT.  He has hypertension, but he is not taking any medications.  He says that he has ulcers in his stomach.  Also, I do not know how it was  diagnosed.  He has never had an EGD.  He has never had a stress test or a cardiac catheterization.  No other medical problems that he is aware of.   SOCIAL HISTORY:  He lives in [place] with his mother.  He is on  disability.  Smokes occasionally, 2 cigarettes every other day.  Occasional  alcohol use.  No illicit drug use.   FAMILY HISTORY:  Father died in his early 44s of heart disease.  He had  congestive heart failure as well.  Hypertension, bronchitis, asthma.  Mother  had some brain surgeries for tumors apparently.  Sister has hypertension and  bronchitis.   REVIEW OF SYSTEMS:  Except for as mentioned in the HPI, 10-point review of  systems was unremarkable.   PHYSICAL EXAMINATION:  VITAL SIGNS:  Temperature 97.9, blood pressure on  arrival was 180/109, currently 170/88.  Heart rate was 98-87, regular.  Respiratory rate is about 18.  Saturation 91% initially, currently 95% on 2  liters.  GENERAL:  An obese male in no apparent discomfort, very somnolent but  arousable.  HEENT:  There is no pallor, no icterus.  Oral mucous membranes are moist.  No oral lesions are noted.  NECK:  Soft, supple.  No thyromegaly appreciated.  CARDIOVASCULAR:  S1, S2 normal, regular.  No murmurs appreciated.  No S3,  S4.  No rubs.  LUNGS:  Clear to auscultation  bilaterally.  No wheezes, rales, or rhonchi.  ABDOMEN:  Obese, nontender, nondistended.  Normal bowel sounds are present.  No masses or organomegaly appreciated.  EXTREMITIES:  No pitting edema noted.  No ulcers are palpable.  NEUROLOGIC:  The patient is somewhat somnolent but arousable and oriented,  and no focal deficits present.   LABORATORY DATA:  White count is 4.8, hemoglobin is 14.4, hematocrit 42, MCV  is 84, platelet count is 231.  D-dimer is 0.22.  Sodium is 142, potassium  3.3, chloride 107, bicarbonate 28, glucose 108, BUN 12, creatinine 1.1.  LFTs are all normal.  Cardiac markers show elevated CK-MB, myoglobin 233.  BNP is 57.  Troponin is negative.   EKG shows sinus rhythm, with a normal axis.  Intervals appear to be within  the normal range.  Evidence for LVH is noted.  There is some T wave  inversion noted in I, aVL, V5, V6.  Biphasic T waves noted in V4.   Chest x-ray shows cardiomegaly.  Otherwise, lungs are clear.   IMPRESSION:  This is a 45 year old, African-American male who has a history  of hypertension, obesity, and carries a diagnosis of asthma which is  somewhat questionable, who presents with chest tightness and shortness of  breath since about 3:00 this morning.  He has cardiomegaly on chest x-ray  and abnormal EKG findings.  He has possible family history of premature  coronary artery disease.  I am concerned that these symptoms are angina  equivalent rather than asthma exacerbation.  Per the ED physicians, he had  never had any wheezing when he presented.  Of course, asthma is part of the  differential.  PE also is part of the differential.  However, with the  normal D-dimer, it is less likely.  The patient also has some clinical  findings suggestive of sleep apnea.   PLAN:  1.  Chest tightness.  He is a young person, but he does have risk factors in     the form of uncontrolled hypertension.  He has obesity.  He has some      family history of heart  disease.  Hence, the abnormal EKG and abnormal      chest  x-ray merit observation in the hospital.  Because of his young age      and his abnormal EKG, I am going to get a cardiology consultation on      him.  Rule out ACS by doing serial cardiac enzymes.  I will get a 2D      echocardiogram.  Put him on aspirin.  Start him on very low-dose beta      blockers.  If he does have wheezing, we may need to stop it.  At this      time, I will not start him on anticoagulation.  Check a lipid profile as      well.  Repeat EKGs.  Check TSH.  2.  Hypertension.  Will start him on beta blockers, start him on lisinopril      because he does have evidence for cardiomegaly, and he has some evidence      for LVH as well, and ACE inhibitors apart from lowering his blood      pressure may help with his cardiac anatomy as well.  It is likely he may      need more than 2 agents to control his blood pressure.  This may be done      slowly over a period of time and mostly as an outpatient.  Urine drug      screen will be checked.  3.  Possible sleep apnea.  The patient does have historical findings      suggestive of sleep apnea.  He will need to have a polysomnography as an      outpatient.  4.  DVT/GI prophylaxis is being initiated.   Further management decisions will be based on the initial testing and the  patient's response to treatment.      Osvaldo Shipper, MD  Electronically Signed     GK/MEDQ  D:  10/30/2005  T:  10/30/2005  Job:  [id number]   cc:   Vida Roller, M.D.  Fax: [phone]

## 2010-11-07 NOTE — Discharge Summary (Signed)
[name], [name]                 ACCOUNT NO.:  [id number]   MEDICAL RECORD NO.:  [id number]          PATIENT TYPE:  INP   LOCATION:  A210                          FACILITY:  [facility]   PHYSICIAN:  Margaretmary Dys, M.D.DATE OF BIRTH:  [birth year]   DATE OF ADMISSION:  10/30/2005  DATE OF DISCHARGE:  05/12/2007LH                                 DISCHARGE SUMMARY   DISCHARGE DIAGNOSES:  1.  Chest pain.  The patient ruled out for any evidence of acute coronary      syndrome.  2.  Poorly controlled hypertension.  3.  Morbid obesity.  4.  Sleep apnea.   DISCHARGE MEDICATIONS:  1.  Hydrochlorothiazide 25 mg p.o. once daily.  2.  Albuterol inhaler, two puffs as needed q.i.d.  3.  Enalapril 5 mg p.o. once daily.   CONSULTATIONS:  Vida Roller, M.D., cardiology.  Recommendations is to  aggressively control his blood pressure better.   HOSPITAL COURSE:  [name] is a 45 year old male who presented to the  emergency room with complaints of chest pain.  It was felt that his chest  pain was atypical, but due to his multiple risk factors, it was decided to  admit him to the hospital.  The patient was seen by cardiology who felt that  even though he had some left ventricular hypertrophy, likely due to  hypertension.  His cardiac enzymes were negative.  BNP was negative here in  the hospital.  It was felt that he also had sleep apnea.  Recommendation was  to have an outpatient sleep study.  During the course of the  hospitalization, the patient did fairly well, with no concerns.   Blood pressure improved.  I discussed details regarding the importance of  compliance with him, and he verbalized full understanding. His beta-blocker  was discontinued prior to discharge to his history of asthma.   FOLLOW UP:  The patient is to follow up with Dr. Vida Roller of [facility] for a stress test as an outpatient.   LABORATORY DATA:  Cardiac enzymes were negative.  Chest x-ray was  negative.  Other labs were unremarkable.     Margaretmary Dys, M.D.  Electronically Signed    AM/MEDQ  D:  12/06/2005  T:  12/06/2005  Job:  [id number]

## 2010-11-07 NOTE — Consult Note (Signed)
[name], [name]                 ACCOUNT NO.:  [id number]   MEDICAL RECORD NO.:  [id number]          PATIENT TYPE:  INP   LOCATION:  A210                          FACILITY:  [facility]   PHYSICIAN:  Vida Roller, M.D.   DATE OF BIRTH:  1965/10/02   DATE OF CONSULTATION:  10/30/2005  DATE OF DISCHARGE:                                   CONSULTATION   PRIMARY CARE PHYSICIAN:  He does not have a primary doctor.   REFERRING PHYSICIAN:  The hospitalist group at [facility].   HISTORY OF PRESENT ILLNESS:  Mr. Harlin is a 45 year old man who is on  disability for unknown reasons who presented with atypical chest discomfort  was admitted to the hospital after an episode of chest pain at rest in a  setting of hypertension which has been uncontrolled for many years. He is a  very poor historian due to his low socioeconomic status but it appears that  he had a prolonged episode of chest pain at rest about 3 o'clock in the  morning while watching TV. He came to the emergency room, continued to have  discomfort and was treated with aspirin and Lopressor and resolved his  discomfort.  He was resting comfortably when I saw him today. He denied any  active chest pain or shortness of breath.   PAST MEDICAL HISTORY:  Significant for hypertension that appears to be  treated a number of years ago and  although he is not really very  straightforward about it but he was put on disability due to his  hypertension at that time and the details are not clear to me. He does not  remember the name of the physician who saw him in [place]. He is morbidly  obese.  He has asthma but has not been treated with any medications.  He  does have an inhaler that he uses occasionally but does not remember the  name of  it. He told his admitting physician that he had stomach ulcers but  had never had a workup for it. Denies any bleeding. It is difficult to know  what is going on with that.   MEDICATIONS:  He  is currently on:  1.  Aspirin 81 mg once a day.  2.  Lovenox 40 mg subcu q.d.  3.  Prinivil 10 mg a day.  4.  Lopressor 25 b.i.d.  5.  Protonix 40 mg a day.   SOCIAL HISTORY:  He lives in [place] with his mother.  He is disabled.  He smokes about 2-3 cigarettes a day and has about a five pack year smoking  history. He drinks alcohol on occasion, denies any drug use.   FAMILY HISTORY:  His mother has hypertension, bronchitis, had a brain tumor  which was removed surgically. His father died in his 82s of coronary artery  disease.   REVIEW OF SYSTEMS:  He denies any fever or chills, sweats, weight change or  adenopathy.  No headaches or sinus tenderness, no changes in his vision or  voice.  He does have the chest pain and shortness  of breath with occasional  palpitations, a cough but no wheezing.  This has all occurred in the last  few days.  No urinary frequency or urgency.  No weakness or numbness.  No  myalgias or arthralgias.  No nausea, vomiting, diarrhea, bright red blood  per rectum, melena, hematochezia, odyno or dysphasia. The the remainder of  his review of systems is negative.   PHYSICAL EXAMINATION:  GENERAL:  He is a morbidly obese black male in no  apparent distress who was sleeping when I approached.  VITAL SIGNS:  His pulse is 89, respirations are 20.  His blood pressure  195/127.  He weighs 340 pounds  HEENT:  Examination of the head, ears, eyes, nose and throat is  unremarkable.  NECK:  His neck is full but there is no obvious jugular venous distension or  carotid bruits.  CHEST:  His chest is clear to auscultation.  CARDIOVASCULAR:  Exam is distant but regular.  No obvious murmur.  SKIN:  No skin rashes.  GU/BREASTS/RECTAL:  Deferred.  ABDOMEN:  Soft, nontender, morbidly obese.  No hepatosplenomegaly that I can  appreciate.  EXTREMITIES:  Lower extremities have trace edema and 1+ pulses.  No bruits.  MUSCULOSKELETAL AND NEUROLOGIC:  Exams were grossly  intact.   Chest x-ray shows cardiomegaly.  EKG shows sinus rhythm with LVH and  repolarization abnormalities. QRS duration is 80 msec.   LABORATORY DATA:  White blood cell count 4.8, H&H of 14 and 42, platelet  count 331.  Sodium 132, potassium 3.3, chloride 107, bicarb 28, BUN 12,  creatinine 1.1. Blood sugar 108.  LFTs are normal.  Single set of cardiac  enzymes shows a CK of 911 with an MB of 15.3, relative index of 1.7,  troponin of 0.04. He has had two sets of point of care cardiac enzymes which  show troponins of less than 0.05.  BNP level is 57, D-dimer 0.22.   IMPRESSION:  1.  This is a man with atypical chest pain, enzymes which are not consistent      with acute myocardial infarction,  EKG which shows LVH, but no acute      ischemia.  2.  Hypertension which is severe on no medications and is probably the most      important thing he has. We need to target a systolic blood pressure of      probably around 150 with meds and to be relatively aggressive about      titrating this down.  He has morbid obesity which complicates his      medical course and his socioeconomic status also makes a very difficult      to address.   PLAN:  What I would probably do is aggressively treat his hypertension and  bring his blood pressure down to reasonable levels. I would cycle his  enzymes to ensure that he has not had an infarction. He will need an  echocardiogram to assess his left ventricular systolic function. I am not  anticipating a particularly good study but we should be able to see the left  ventricle. We may have to use IV contrast. If these all look okay, he has  not made any enzymes, his blood pressure is managed medically and his  echocardiogram shows preserved left ventricular systolic function without  significant wall motion abnormality, I would probably complete his ischemic  workup as an outpatient with a 2-day myocardial perfusion study.  If on the other hand any of these  things are abnormal, then we probably need to pursue  heart catheterization given his relatively aggressive presentation.      Vida Roller, M.D.  Electronically Signed    JH/MEDQ  D:  10/30/2005  T:  10/31/2005  Job:  [id number]

## 2010-11-07 NOTE — Procedures (Signed)
[name], [name]                 ACCOUNT NO.:  [id number]   MEDICAL RECORD NO.:  [id number]          PATIENT TYPE:  INP   LOCATION:  A210                          FACILITY:  [facility]   PHYSICIAN:  Vida Roller, M.D.   DATE OF BIRTH:  [birth year]   DATE OF PROCEDURE:  10/30/2005  DATE OF DISCHARGE:                                  ECHOCARDIOGRAM   REFERRING PHYSICIAN:  Dr. Rito Ehrlich.   TAPE NUMBER:  [id number]   TAPE COUNT:  1537 through 2028.   This is a 45 year old man with morbid obesity, hypertension, and chest pain.   TECHNICAL QUALITY OF STUDY:  Difficult.   M-MODE TRACING:   AORTA:  34 mm   LEFT ATRIUM:  43 mm   SEPTUM:  19 mm   POSTERIOR WALL:  17 mm   LEFT VENTRICULAR DIASTOLIC DIMENSION:  48 mm   LEFT VENTRICULAR SYSTOLIC DIMENSION:  36 mm   2-D AND DOPPLER IMAGING:  The left ventricle is normal size. There is  preserved LV systolic function. The estimated ejection fraction is 55% to  60%. There are no obvious wall motion abnormalities, although it is a  difficult study. Really can only see the walls in the parasternal short axis  view. There is moderate to severe concentric left ventricular hypertrophy.   The right ventricle is normal size with normal systolic function.   Both atria appear be dilated.   The aortic valve is without stenosis or regurgitation.   The mitral valve has trivial regurgitation.   Tricuspid valve with trivial regurgitation.   There is no pericardial effusion.      Vida Roller, M.D.  Electronically Signed     JH/MEDQ  D:  10/30/2005  T:  10/31/2005  Job:  [id number]

## 2010-11-28 ENCOUNTER — Encounter: Payer: Self-pay | Admitting: Cardiology

## 2010-11-28 ENCOUNTER — Encounter: Payer: Self-pay | Admitting: *Deleted

## 2010-11-28 ENCOUNTER — Ambulatory Visit (INDEPENDENT_AMBULATORY_CARE_PROVIDER_SITE_OTHER): Payer: Medicare Other | Admitting: Cardiology

## 2010-11-28 DIAGNOSIS — J45909 Unspecified asthma, uncomplicated: Secondary | ICD-10-CM

## 2010-11-28 DIAGNOSIS — I509 Heart failure, unspecified: Secondary | ICD-10-CM

## 2010-11-28 DIAGNOSIS — E785 Hyperlipidemia, unspecified: Secondary | ICD-10-CM

## 2010-11-28 DIAGNOSIS — I10 Essential (primary) hypertension: Secondary | ICD-10-CM

## 2010-11-28 NOTE — Assessment & Plan Note (Addendum)
CHF is compensated now that hypertension is somewhat controlled.  The importance of further improvement in treatment of hypertension was emphasized.

## 2010-11-28 NOTE — Patient Instructions (Signed)
Your physician recommends that you schedule a follow-up appointment in: 6 months Your physician has requested that you regularly monitor and record your blood pressure readings at home. Please use the same machine at the same time of day to check your readings and record them to bring to your follow-up visit.  Your physician has recommended you make the following change in your medication:stop metoprolol , increase norvasc to 10mg  daily, take 2 lisinopril/hct every day, hytrin 5mg  dailyx1 week then increase to 10mg  daily Nurse visit in 1 month please bring blood pressure diary to nurse visit Your physician recommends that you return for lab work in: next week

## 2010-11-28 NOTE — Assessment & Plan Note (Signed)
Patient has required an inhaler for treatment of an exacerbation of his asthma in recent months.  This is another reason to discontinue beta blocker treatment.

## 2010-11-28 NOTE — Assessment & Plan Note (Signed)
Most recent lipid profile in 2010 was somewhat suboptimal, but adequate in light of the patient's absence of known vascular disease.  A repeat study will be obtained.

## 2010-11-28 NOTE — Progress Notes (Signed)
HPI : [name] returns to the office beyond what was anticipated for continued assessment and treatment of hypertension.  He failed to appear for a nursing visit and for his followup visit, but now is seen after being told that we could not renew his medications if he did not return.  As a result, he has not been taking his prescriptions properly, trying to stretch the duration of his supply by taking fewer pills then prescribed.  He denies chest discomfort, dyspnea, orthopnea, PND or syncope.  He has not been hospitalized or required evaluation or treatment in the Emergency Department.  Patient has been monitoring blood pressure at home-by his report, control is inadequate with systolics consistently above 150 and diastolics frequently about 95.  Current Outpatient Prescriptions on File Prior to Visit  Medication Sig Dispense Refill  . albuterol-ipratropium (COMBIVENT) 18-103 MCG/ACT inhaler Inhale 2 puffs into the lungs every 6 (six) hours as needed.        Marland Kitchen amLODipine (NORVASC) 5 MG tablet Take 5 mg by mouth daily.        . beclomethasone (QVAR) 80 MCG/ACT inhaler Inhale 1 puff into the lungs as needed.        Marland Kitchen lisinopril-hydrochlorothiazide (PRINZIDE,ZESTORETIC) 20-12.5 MG per tablet Take 1 tablet by mouth daily.        . metoprolol (LOPRESSOR) 100 MG tablet Take 100 mg by mouth 2 (two) times daily. Take 1/2 tab bid           No Known Allergies    Past medical history, social history, and family history reviewed and updated.  ROS: See history of present illness.  PHYSICAL EXAM: BP 182/94  Pulse 92  Ht 6\' 1"  (1.854 m)  Wt 351 lb (159.213 kg)  BMI 46.31 kg/m2  SpO2 96% ; weight has been stable over the past year General-Well developed; no acute distress Body habitus-Markedly obese Neck-No JVD; no carotid bruits Lungs-clear lung fields; resonant to percussion; decreased breath sounds at the bases Cardiovascular-normal PMI; normal S1 and S2; grade 1/6 basilar systolic ejection  murmur Abdomen-normal bowel sounds; soft and non-tender without masses or organomegaly Musculoskeletal-No deformities, no cyanosis or clubbing Neurologic-Normal cranial nerves; symmetric strength and tone Skin-Warm, no significant lesions Extremities-distal pulses intact; no edema  Laboratory: 01/2010-normal CBC; normal complete metabolic profile; TSH-0.88; normal urinalysis  ASSESSMENT AND PLAN:

## 2010-11-28 NOTE — Assessment & Plan Note (Signed)
Blood pressure control remains suboptimal, primarily due to patient's noncompliance.  He expresses understanding of the importance of adequate control, but does not appear quite so interested in achieving this.  I have asked him to take his lisinopril/HCT as ordered for a total of 40/25 mg q.d.  We will increase amlodipine to 10 mg q.d.   He notes fatigue and a sense of mental slowing with metoprolol.  Hytrin will be substituted, initially at a dose of 5 mg and then 10 mg q.d.  Patient will monitor blood pressure at home and return in 2 months for reassessment by the cardiology nurses.

## 2010-12-04 ENCOUNTER — Other Ambulatory Visit: Payer: Self-pay | Admitting: Cardiology

## 2010-12-04 ENCOUNTER — Other Ambulatory Visit: Payer: Self-pay | Admitting: *Deleted

## 2010-12-04 MED ORDER — AMLODIPINE BESYLATE 5 MG PO TABS: 10.0000 mg | ORAL_TABLET | Freq: Every day | ORAL | Status: DC

## 2010-12-04 MED ORDER — TERAZOSIN HCL 5 MG PO CAPS: 5.0000 mg | ORAL_CAPSULE | Freq: Every day | ORAL | Status: DC

## 2010-12-04 MED ORDER — LISINOPRIL-HYDROCHLOROTHIAZIDE 20-12.5 MG PO TABS: 2.0000 | ORAL_TABLET | Freq: Every day | ORAL | Status: DC

## 2010-12-04 MED ORDER — TERAZOSIN HCL 10 MG PO CAPS: 10.0000 mg | ORAL_CAPSULE | Freq: Every day | ORAL | Status: DC

## 2010-12-04 NOTE — Telephone Encounter (Signed)
Patient states that he was told by [facility] in [place] that he needed to have all his RX's called in / states that he had no refills / tg

## 2010-12-05 MED ORDER — IPRATROPIUM-ALBUTEROL 18-103 MCG/ACT IN AERO: 2.0000 | INHALATION_SPRAY | Freq: Four times a day (QID) | RESPIRATORY_TRACT | Status: DC | PRN

## 2010-12-05 MED ORDER — BECLOMETHASONE DIPROPIONATE 80 MCG/ACT IN AERS: 1.0000 | INHALATION_SPRAY | RESPIRATORY_TRACT | Status: DC | PRN

## 2010-12-05 NOTE — Telephone Encounter (Signed)
Pt called to request  The refills on Combivent 18-103 mcg, Qvar 80 mcg inhalers to [facility] in [place].

## 2010-12-05 NOTE — Telephone Encounter (Signed)
This is Dr. Marvel Plan patient. Will forward to nursing.

## 2011-01-05 ENCOUNTER — Ambulatory Visit (INDEPENDENT_AMBULATORY_CARE_PROVIDER_SITE_OTHER): Payer: Medicare Other

## 2011-01-05 ENCOUNTER — Other Ambulatory Visit: Payer: Self-pay | Admitting: Cardiology

## 2011-01-05 VITALS — BP 181/111 | HR 86 | Wt 349.0 lb

## 2011-01-05 DIAGNOSIS — I10 Essential (primary) hypertension: Secondary | ICD-10-CM

## 2011-01-05 NOTE — Progress Notes (Signed)
Nurse Visit X 2 since 11-28-10 S: Pt. arrives in office for a BP check with nurse. Pt. came into office on Friday 01-02-11 for a scheduled BP check but forgot his medications and was unable to verify his meds so appt. was rescheduled for this morning. B: On last OV with Dr. Dietrich Pates on 11-28-10 pt. was advised to take Lisinopril/HCT as directed at 40/25 mg daily, increase Amlodipine to 10 mg daily and change Metoprolol to Hytrin initially at a dose of 5 mg and then 10 mg daily. Also, pt. was advised to monitor and record BP's and have a CMET and Lipid profile drawn in 1 week.   A: Pt. has no complaints at this time. He states he had labs drawn this morning before BP check. Pt. brought his medication bottles to this visit and is not taking as directed. He continues to take Metoprolol and states that Hytrin makes him weak. On Friday, pt's BP was 177/105, this morning BP is 181/111 and on last OV BP was 182/94.  R: Pt. given written instructions on which medications to take and when and a f/u appt. has been scheduled with Joni Reining, NP on 01-13-11 at 1:00pm due to elevated BP and his confusion about medications.  Pt. advised that we will contact him with any additional recomendatons from Dr. Dietrich Pates if any.  02/04/11 Note reviewed in June.  Plan was to allow reassessment at next OV.  Now sent to me again.  Pt has had subsequent office visits, so there is no need for further attention to events of 2 months ago.  Wedgefield Bing, MD

## 2011-01-06 LAB — COMPREHENSIVE METABOLIC PANEL
AST: 31 U/L (ref 0–37)
Albumin: 4.3 g/dL (ref 3.5–5.2)
BUN: 11 mg/dL (ref 6–23)
CO2: 28 mEq/L (ref 19–32)
Calcium: 9.5 mg/dL (ref 8.4–10.5)
Chloride: 104 mEq/L (ref 96–112)
Creat: 1 mg/dL (ref 0.50–1.35)
Potassium: 3.6 mEq/L (ref 3.5–5.3)

## 2011-01-06 LAB — LIPID PANEL
Cholesterol: 167 mg/dL (ref 0–200)
HDL: 45 mg/dL (ref >39)
Total CHOL/HDL Ratio: 3.7 Ratio

## 2011-01-09 ENCOUNTER — Encounter: Payer: Self-pay | Admitting: *Deleted

## 2011-01-13 ENCOUNTER — Ambulatory Visit: Payer: Medicare Other | Admitting: Adult Health

## 2011-01-23 ENCOUNTER — Encounter: Payer: Self-pay | Admitting: Adult Health

## 2011-01-23 ENCOUNTER — Ambulatory Visit (INDEPENDENT_AMBULATORY_CARE_PROVIDER_SITE_OTHER): Payer: Medicare Other | Admitting: Adult Health

## 2011-01-23 VITALS — BP 182/102 | HR 88 | Ht 73.0 in | Wt 351.0 lb

## 2011-01-23 DIAGNOSIS — I10 Essential (primary) hypertension: Secondary | ICD-10-CM

## 2011-01-23 MED ORDER — AMLODIPINE BESYLATE 5 MG PO TABS: 10.0000 mg | ORAL_TABLET | Freq: Every day | ORAL | Status: DC

## 2011-01-23 NOTE — Progress Notes (Signed)
HPI:  [name] returns today for BP evaluation, as he is having issues with continued high levels.  He was seen by Dr Dietrich Pates in June and medications were adjusted. Metoprolol was stopped, Norvasc was increased to 10mg  daily and lisinopril/HCTZ was increased to BID.  He comes today not having taken his medications until just before coming in the door. He also is not taking Norvasc as he thought this medication was discontinued like the metoprolol.  He continues to eat high fat fried foods, to include fried chicken which was eating just before coming to appointment. He wears sunglasses which he does not want to take off because the room is too bright.  He denis drug use or ETOH.  No Known Allergies  Current Outpatient Prescriptions  Medication Sig Dispense Refill  . albuterol-ipratropium (COMBIVENT) 18-103 MCG/ACT inhaler Inhale 2 puffs into the lungs every 6 (six) hours as needed.  2 Inhaler  2  . amLODipine (NORVASC) 5 MG tablet Take 2 tablets (10 mg total) by mouth daily.  30 tablet  6  . beclomethasone (QVAR) 80 MCG/ACT inhaler Inhale 1 puff into the lungs as needed.  1 Inhaler  2  . terazosin (HYTRIN) 10 MG capsule Take 1 capsule (10 mg total) by mouth at bedtime.  30 capsule  6  . DISCONTD: amLODipine (NORVASC) 5 MG tablet Take 2 tablets (10 mg total) by mouth daily.  30 tablet  6  . lisinopril-hydrochlorothiazide (PRINZIDE,ZESTORETIC) 20-12.5 MG per tablet Take 1 tablet by mouth 2 (two) times daily.        Marland Kitchen DISCONTD: terazosin (HYTRIN) 5 MG capsule Take 1 capsule (5 mg total) by mouth at bedtime.  30 capsule  6    Past Medical History  Diagnosis Date  . Hypertensive heart disease     Hypertensive heart disease with prior episodes of CHF  . Hyperlipidemia   . Hypertension   . Gastroesophageal reflux disease   . Osteoarthritis   . Peptic ulcer disease   . Morbid obesity     /sleep apnea  . Allergic rhinitis     plus h/o asthma  . Tobacco abuse, in remission     Cigarettes  discontinued in 2010    Past Surgical History  Procedure Date  . None     ZOX:WRUEAV of systems complete and found to be negative unless listed above PHYSICAL EXAM BP 182/102  Pulse 88  Ht 6\' 1"  (1.854 m)  Wt 351 lb (159.213 kg)  BMI 46.31 kg/m2  SpO2 95% General: Well developed, well nourished, obese, in no acute distress Head: Eyes PERRLA, No xanthomas.   Normal cephalic and atramatic  Lungs: Clear bilaterally to auscultation and percussion. Heart: HRRR S1 S2, with soft S4 murmur.  Pulses are 2+ & equal.            No carotid bruit. No JVD.  No abdominal bruits. No femoral bruits. Abdomen: Bowel sounds are positive, abdomen soft and non-tender without masses or                  Hernia's noted. Msk:  Back normal, normal gait. Normal strength and tone for age. Extremities: No clubbing, cyanosis or edema.  DP +1 Neuro: Alert and oriented X 3. Psych:  Good affect, responds appropriately    ASSESSMENT AND PLAN

## 2011-01-23 NOTE — Patient Instructions (Signed)
Your physician recommends that you schedule a follow-up appointment in: 3 months Your physician has recommended you make the following change in your medication: take medications as listed in this note

## 2011-01-23 NOTE — Assessment & Plan Note (Signed)
He is counseled on medications and taking them as directed. He is to begin taking amlodipine 10 mg daily as directed. Rx is provided.  He is to avoid high salt, fried foods. He will be seen in 3 months.

## 2011-03-19 LAB — DIFFERENTIAL
Basophils Absolute: 0
Eosinophils Absolute: 0.1
Eosinophils Relative: 2
Eosinophils Relative: 3
Lymphocytes Relative: 22
Lymphocytes Relative: 34
Lymphs Abs: 1.2
Lymphs Abs: 1.2
Lymphs Abs: 1.5
Monocytes Absolute: 0.4
Monocytes Absolute: 0.4
Monocytes Relative: 7
Monocytes Relative: 9
Neutro Abs: 3.8

## 2011-03-19 LAB — POCT CARDIAC MARKERS: Troponin i, poc: <0.05

## 2011-03-19 LAB — COMPREHENSIVE METABOLIC PANEL
Albumin: 3.6
BUN: 14
Calcium: 8.5
Creatinine, Ser: 0.98
GFR calc Af Amer: >60
Total Bilirubin: 0.7
Total Protein: 6.2

## 2011-03-19 LAB — B-NATRIURETIC PEPTIDE (CONVERTED LAB)
Pro B Natriuretic peptide (BNP): 169 — ABNORMAL HIGH
Pro B Natriuretic peptide (BNP): 81.5

## 2011-03-19 LAB — BASIC METABOLIC PANEL
BUN: 12
CO2: 31
Chloride: 104
GFR calc Af Amer: >60
GFR calc non Af Amer: >60
Potassium: 3.2 — ABNORMAL LOW
Potassium: 3.4 — ABNORMAL LOW
Sodium: 142

## 2011-03-19 LAB — CARDIAC PANEL(CRET KIN+CKTOT+MB+TROPI)
CK, MB: 7 — ABNORMAL HIGH
CK, MB: 8.9 — ABNORMAL HIGH
Relative Index: 1.9
Relative Index: 1.9
Troponin I: 0.02

## 2011-03-19 LAB — CBC
HCT: 40.1
HCT: 41.8
HCT: 43.7
Hemoglobin: 14.7
MCHC: 34.3
MCV: 82.8
MCV: 83.5
Platelets: 185
Platelets: 189
Platelets: 194
RDW: 13.6
WBC: 4.5
WBC: 5.2

## 2011-03-19 LAB — TSH: TSH: 0.794

## 2011-03-19 LAB — LIPID PANEL
HDL: 31 — ABNORMAL LOW
Triglycerides: 92

## 2011-03-26 ENCOUNTER — Encounter (HOSPITAL_COMMUNITY): Payer: Self-pay | Admitting: Emergency Medicine

## 2011-03-26 ENCOUNTER — Emergency Department (HOSPITAL_COMMUNITY)
Admission: EM | Admit: 2011-03-26 | Discharge: 2011-03-26 | Disposition: A | Discharge status: Home / Self Care | Payer: Medicare Other | Attending: Emergency Medicine | Admitting: Emergency Medicine

## 2011-03-26 ENCOUNTER — Emergency Department (HOSPITAL_COMMUNITY): Payer: Medicare Other

## 2011-03-26 DIAGNOSIS — S80819A Abrasion, unspecified lower leg, initial encounter: Secondary | ICD-10-CM

## 2011-03-26 DIAGNOSIS — S8010XA Contusion of unspecified lower leg, initial encounter: Secondary | ICD-10-CM | POA: Insufficient documentation

## 2011-03-26 DIAGNOSIS — Z79899 Other long term (current) drug therapy: Secondary | ICD-10-CM | POA: Insufficient documentation

## 2011-03-26 DIAGNOSIS — IMO0002 Reserved for concepts with insufficient information to code with codable children: Secondary | ICD-10-CM | POA: Insufficient documentation

## 2011-03-26 DIAGNOSIS — W108XXA Fall (on) (from) other stairs and steps, initial encounter: Secondary | ICD-10-CM | POA: Insufficient documentation

## 2011-03-26 DIAGNOSIS — M79609 Pain in unspecified limb: Secondary | ICD-10-CM | POA: Insufficient documentation

## 2011-03-26 DIAGNOSIS — Z87891 Personal history of nicotine dependence: Secondary | ICD-10-CM | POA: Insufficient documentation

## 2011-03-26 MED ORDER — HYDROCODONE-ACETAMINOPHEN 5-325 MG PO TABS: 1.0000 | ORAL_TABLET | ORAL | Status: AC | PRN

## 2011-03-26 MED ORDER — BACITRACIN-NEOMYCIN-POLYMYXIN 400-5-5000 EX OINT
TOPICAL_OINTMENT | Freq: Once | CUTANEOUS | Status: AC
  Administered 2011-03-26: 1 via TOPICAL
  Filled 2011-03-26: qty 1

## 2011-03-26 MED ORDER — TETANUS-DIPHTH-ACELL PERTUSSIS 5-2.5-18.5 LF-MCG/0.5 IM SUSP
0.5000 mL | Freq: Once | INTRAMUSCULAR | Status: AC
  Administered 2011-03-26: 0.5 mL via INTRAMUSCULAR
  Filled 2011-03-26: qty 0.5

## 2011-03-26 NOTE — ED Provider Notes (Addendum)
History     CSN: [id number] Arrival date & time: 03/26/2011 12:33 PM  Chief Complaint  Patient presents with  . Fall  . Leg Pain    (Consider location/radiation/quality/duration/timing/severity/associated sxs/prior treatment) Patient is a 45 y.o. male presenting with fall and leg pain. The history is provided by the patient.  Fall Incident onset: He was walking up his basement steps when one step broke,  causing him to fall  sideways down about 7 steps.  He landed on his buttocks,  which he reports is sore,  but his right lower leg was hit by the broken step. Incident: He has an abrasion,  increased pain and swelling of his right lateral upper tbia area. Point of impact: Buttocks and right lower leg.  He denies hitting his head. The pain is at a severity of 5/10. The pain is moderate. He was ambulatory at the scene. There was no drug use involved in the accident. Pertinent negatives include no visual change, no fever, no numbness, no abdominal pain, no nausea, no vomiting, no headaches and no loss of consciousness. Exacerbated by: palpation. He has tried nothing for the symptoms.  Leg Pain  Pertinent negatives include no numbness.    Past Medical History  Diagnosis Date  . Hypertensive heart disease     Hypertensive heart disease with prior episodes of CHF  . Hyperlipidemia   . Hypertension   . Gastroesophageal reflux disease   . Osteoarthritis   . Peptic ulcer disease   . Morbid obesity     /sleep apnea  . Allergic rhinitis     plus h/o asthma  . Tobacco abuse, in remission     Cigarettes discontinued in 2010    Past Surgical History  Procedure Date  . None     History reviewed. No pertinent family history.  History  Substance Use Topics  . Smoking status: Former Games developer  . Smokeless tobacco: Former Neurosurgeon    Quit date: 01/22/2006   Comment: 1 ppd former smoker  . Alcohol Use: Yes     occasional      Review of Systems  Constitutional: Negative for fever.  HENT:  Negative for congestion, sore throat and neck pain.   Eyes: Negative.   Respiratory: Negative for chest tightness and shortness of breath.   Cardiovascular: Negative for chest pain.  Gastrointestinal: Negative for nausea, vomiting and abdominal pain.  Genitourinary: Negative.   Musculoskeletal: Negative for joint swelling and arthralgias.  Skin: Positive for wound. Negative for rash.  Neurological: Negative for dizziness, loss of consciousness, weakness, light-headedness, numbness and headaches.  Hematological: Negative.   Psychiatric/Behavioral: Negative.     Allergies  Review of patient's allergies indicates no known allergies.  Home Medications   Current Outpatient Rx  Name Route Sig Dispense Refill  . IPRATROPIUM-ALBUTEROL 18-103 MCG/ACT IN AERO Inhalation Inhale 2 puffs into the lungs every 6 (six) hours as needed. For shortness of breath     . AMLODIPINE BESYLATE 5 MG PO TABS Oral Take 10 mg by mouth daily.      . BECLOMETHASONE DIPROPIONATE 80 MCG/ACT IN AERS Inhalation Inhale 1 puff into the lungs as needed. For shortness of breath     . LISINOPRIL-HYDROCHLOROTHIAZIDE 20-12.5 MG PO TABS Oral Take 1 tablet by mouth 2 (two) times daily.      Marland Kitchen TERAZOSIN HCL 10 MG PO CAPS Oral Take 10 mg by mouth at bedtime as needed. Blood pressure       BP 159/99  Pulse 97  Temp(Src)  97.4 F (36.3 C) (Oral)  Resp 20  SpO2 98%  Physical Exam  Nursing note and vitals reviewed. Constitutional: He is oriented to person, place, and time. He appears well-developed and well-nourished.  HENT:  Head: Normocephalic and atraumatic.  Eyes: Conjunctivae are normal.  Neck: Normal range of motion.  Cardiovascular: Normal rate and intact distal pulses.   Pulmonary/Chest: Effort normal and breath sounds normal.  Abdominal: Soft. There is no tenderness.  Musculoskeletal: Normal range of motion. He exhibits edema and tenderness.       Legs: Neurological: He is alert and oriented to person, place,  and time.  Skin: Skin is warm and dry.  Psychiatric: He has a normal mood and affect.    ED Course  Procedures (including critical care time)  Labs Reviewed - No data to display Dg Tibia/fibula Right  03/26/2011  *RADIOLOGY REPORT*  Clinical Data: Larey Seat.  Injured leg.  RIGHT TIBIA AND FIBULA - 2 VIEW  Comparison: None  Findings: There is a large rounded soft tissue density along the medial and posterior aspect of the lower leg which is likely a hematoma.  The knee and ankle joints are maintained.  No acute fracture.  IMPRESSION: No acute bony findings.  Original Report Authenticated By: P. Loralie Champagne, M.D.     1. Contusion of lower leg   2. Abrasion of lower leg       MDM  Contusion of right lateral calf.  Tetanus updated,  xrays reviewed.  Hematoma reflected on xray is old chronic swelling,  Nonpainful per patient.    Wound dressed.  Ace wrap.  Pain meds provided.        Candis Musa, PA 03/26/11 1414  Candis Musa, PA 04/02/11 1500

## 2011-03-26 NOTE — ED Notes (Signed)
Pt fell down approx 7 steps. C/o pain to bottom and  r knee and r lower leg pain. deneis hitting head or LOC. Swelling noted.

## 2011-03-26 NOTE — ED Provider Notes (Signed)
Medical screening examination/treatment/procedure(s) were performed by non-physician practitioner and as supervising physician I was immediately available for consultation/collaboration.   Joya Gaskins, MD 03/26/11 2209

## 2011-04-03 ENCOUNTER — Emergency Department (HOSPITAL_COMMUNITY)
Admission: EM | Admit: 2011-04-03 | Discharge: 2011-04-04 | Disposition: A | Discharge status: Home / Self Care | Payer: Medicare Other | Attending: Emergency Medicine | Admitting: Emergency Medicine

## 2011-04-03 ENCOUNTER — Emergency Department (HOSPITAL_COMMUNITY): Payer: Medicare Other

## 2011-04-03 ENCOUNTER — Encounter (HOSPITAL_COMMUNITY): Payer: Self-pay | Admitting: *Deleted

## 2011-04-03 DIAGNOSIS — R300 Dysuria: Secondary | ICD-10-CM | POA: Insufficient documentation

## 2011-04-03 DIAGNOSIS — E785 Hyperlipidemia, unspecified: Secondary | ICD-10-CM | POA: Insufficient documentation

## 2011-04-03 DIAGNOSIS — S3210XA Unspecified fracture of sacrum, initial encounter for closed fracture: Secondary | ICD-10-CM | POA: Insufficient documentation

## 2011-04-03 DIAGNOSIS — G4730 Sleep apnea, unspecified: Secondary | ICD-10-CM | POA: Insufficient documentation

## 2011-04-03 DIAGNOSIS — Z87891 Personal history of nicotine dependence: Secondary | ICD-10-CM | POA: Insufficient documentation

## 2011-04-03 DIAGNOSIS — J45909 Unspecified asthma, uncomplicated: Secondary | ICD-10-CM | POA: Insufficient documentation

## 2011-04-03 DIAGNOSIS — I119 Hypertensive heart disease without heart failure: Secondary | ICD-10-CM | POA: Insufficient documentation

## 2011-04-03 DIAGNOSIS — S322XXA Fracture of coccyx, initial encounter for closed fracture: Secondary | ICD-10-CM

## 2011-04-03 DIAGNOSIS — R630 Anorexia: Secondary | ICD-10-CM | POA: Insufficient documentation

## 2011-04-03 DIAGNOSIS — W108XXA Fall (on) (from) other stairs and steps, initial encounter: Secondary | ICD-10-CM | POA: Insufficient documentation

## 2011-04-03 DIAGNOSIS — R109 Unspecified abdominal pain: Secondary | ICD-10-CM

## 2011-04-03 DIAGNOSIS — I10 Essential (primary) hypertension: Secondary | ICD-10-CM | POA: Insufficient documentation

## 2011-04-03 DIAGNOSIS — K219 Gastro-esophageal reflux disease without esophagitis: Secondary | ICD-10-CM | POA: Insufficient documentation

## 2011-04-03 DIAGNOSIS — R110 Nausea: Secondary | ICD-10-CM | POA: Insufficient documentation

## 2011-04-03 DIAGNOSIS — R10815 Periumbilic abdominal tenderness: Secondary | ICD-10-CM | POA: Insufficient documentation

## 2011-04-03 LAB — CBC
MCH: 29 pg (ref 26.0–34.0)
MCHC: 33.8 g/dL (ref 30.0–36.0)
MCV: 85.7 fL (ref 78.0–100.0)
Platelets: 252 10*3/uL (ref 150–400)
RDW: 12.7 % (ref 11.5–15.5)

## 2011-04-03 LAB — DIFFERENTIAL
Basophils Absolute: 0 10*3/uL (ref 0.0–0.1)
Basophils Relative: 0 % (ref 0–1)
Eosinophils Absolute: 0.2 10*3/uL (ref 0.0–0.7)
Eosinophils Relative: 3 % (ref 0–5)
Lymphs Abs: 2.2 10*3/uL (ref 0.7–4.0)
Neutrophils Relative %: 52 % (ref 43–77)

## 2011-04-03 LAB — COMPREHENSIVE METABOLIC PANEL
ALT: 27 U/L (ref 0–53)
Albumin: 4.1 g/dL (ref 3.5–5.2)
Calcium: 9.6 mg/dL (ref 8.4–10.5)
GFR calc Af Amer: >90 mL/min (ref >90)
Glucose, Bld: 97 mg/dL (ref 70–99)
Potassium: 3.7 mEq/L (ref 3.5–5.1)
Sodium: 139 mEq/L (ref 135–145)
Total Protein: 7 g/dL (ref 6.0–8.3)

## 2011-04-03 MED ORDER — SODIUM CHLORIDE 0.9 % IV BOLUS (SEPSIS)
1000.0000 mL | Freq: Once | INTRAVENOUS | Status: AC
  Administered 2011-04-03: 1000 mL via INTRAVENOUS

## 2011-04-03 MED ORDER — ONDANSETRON HCL 4 MG/2ML IJ SOLN
4.0000 mg | Freq: Once | INTRAMUSCULAR | Status: AC
  Administered 2011-04-03: 4 mg via INTRAVENOUS
  Filled 2011-04-03: qty 2

## 2011-04-03 MED ORDER — MORPHINE SULFATE 4 MG/ML IJ SOLN
4.0000 mg | Freq: Once | INTRAMUSCULAR | Status: AC
  Administered 2011-04-03: 4 mg via INTRAVENOUS
  Filled 2011-04-03: qty 1

## 2011-04-03 NOTE — ED Notes (Signed)
Pt c/o lower abdominal pain and lower back pain. Pt c/o burning upon urination and nausea.

## 2011-04-03 NOTE — ED Notes (Signed)
Pt states fell on his bottom and back on Monday. States pain in mid to lower abd and lower back have increased with decreased urination. Pt continues to pace floor in pain. Leona Singleton PA-C

## 2011-04-03 NOTE — ED Provider Notes (Signed)
History     CSN: [id number] Arrival date & time: 04/03/2011  9:39 PM  Chief Complaint  Patient presents with  . Abdominal Pain  . Back Pain    (Consider location/radiation/quality/duration/timing/severity/associated sxs/prior treatment) Patient is a 45 y.o. male presenting with abdominal pain. The history is provided by the patient.  Abdominal Pain The primary symptoms of the illness include abdominal pain and nausea. The primary symptoms of the illness do not include fever, vomiting or diarrhea. Primary symptoms comment: He describes intermittent sharp stabbing pain in his lower abdomen since yesterday.  He also describes decreased frequency of urination and pain in his coccyx area since sustaining a fall 1 week ago. Episode onset: He reports loss of appetite including decreased fluid intake since his fall.  He  landed on his buttocks when a step collapsed,  causing him to fall down 7  last week.  Has had low back pain since this event.  Reports abdominal pain since yesterday. The onset of the illness was gradual. The problem has been gradually worsening.  Associated with: He has daily bowel movements,  denies constipation or diarrhea.  He did have improvement in his abdominal pain after his bm today ,  but only temporarily.  Reports intermittent episodes of abdominal pain lasting about 20 minutes.   Additional symptoms associated with the illness include anorexia. Symptoms associated with the illness do not include constipation, urgency, hematuria or frequency. Associated symptoms comments: His urine has been darker than normal.  He denies hematuria.  No history of kidney stones.. Significant associated medical issues include cardiac disease. Significant associated medical issues do not include PUD, inflammatory bowel disease, diabetes, gallstones or substance abuse.    Past Medical History  Diagnosis Date  . Hypertensive heart disease     Hypertensive heart disease with prior episodes of CHF   . Hyperlipidemia   . Hypertension   . Gastroesophageal reflux disease   . Osteoarthritis   . Peptic ulcer disease   . Morbid obesity     /sleep apnea  . Allergic rhinitis     plus h/o asthma  . Tobacco abuse, in remission     Cigarettes discontinued in 2010    Past Surgical History  Procedure Date  . None     History reviewed. No pertinent family history.  History  Substance Use Topics  . Smoking status: Former Games developer  . Smokeless tobacco: Former Neurosurgeon    Quit date: 01/22/2006   Comment: 1 ppd former smoker  . Alcohol Use: Yes     occasional      Review of Systems  Constitutional: Negative for fever.  HENT: Negative for congestion, sore throat and neck pain.   Eyes: Negative.   Respiratory: Negative for chest tightness.   Cardiovascular: Negative for chest pain.  Gastrointestinal: Positive for nausea, abdominal pain and anorexia. Negative for vomiting, diarrhea and constipation.  Genitourinary: Positive for decreased urine volume and difficulty urinating. Negative for urgency, frequency, hematuria, flank pain and discharge.  Musculoskeletal: Negative for joint swelling and arthralgias.  Skin: Negative.  Negative for rash and wound.  Neurological: Negative for dizziness, weakness, light-headedness, numbness and headaches.  Hematological: Negative.   Psychiatric/Behavioral: Negative.     Allergies  Bee venom and Aspirin  Home Medications   Current Outpatient Rx  Name Route Sig Dispense Refill  . IPRATROPIUM-ALBUTEROL 18-103 MCG/ACT IN AERO Inhalation Inhale 2 puffs into the lungs every 6 (six) hours as needed. For shortness of breath     . AMLODIPINE  BESYLATE 5 MG PO TABS Oral Take 10 mg by mouth daily.      . BECLOMETHASONE DIPROPIONATE 80 MCG/ACT IN AERS Inhalation Inhale 1 puff into the lungs as needed. For shortness of breath     . LISINOPRIL-HYDROCHLOROTHIAZIDE 20-12.5 MG PO TABS Oral Take 1 tablet by mouth 2 (two) times daily.      Marland Kitchen TERAZOSIN HCL 10 MG  PO CAPS Oral Take 10 mg by mouth at bedtime as needed. Blood pressure     . HYDROCODONE-ACETAMINOPHEN 5-325 MG PO TABS Oral Take 1 tablet by mouth every 4 (four) hours as needed for pain. 15 tablet 0    BP 169/93  Pulse 86  Temp(Src) 98.8 F (37.1 C) (Oral)  Resp 14  Ht 6' (1.829 m)  Wt 350 lb (158.759 kg)  BMI 47.47 kg/m2  SpO2 99%  Physical Exam  Nursing note and vitals reviewed. Constitutional: He is oriented to person, place, and time. He appears well-developed and well-nourished.       Morbidly obese.  HENT:  Head: Normocephalic and atraumatic.  Eyes: Conjunctivae are normal.  Neck: Normal range of motion.  Cardiovascular: Normal rate, regular rhythm, normal heart sounds and intact distal pulses.   Pulmonary/Chest: Effort normal and breath sounds normal. He has no wheezes.  Abdominal: Soft. Bowel sounds are normal. He exhibits no distension and no mass. There is no hepatosplenomegaly. There is tenderness in the periumbilical area. There is no rigidity, no rebound, no guarding, no CVA tenderness and negative Murphy's sign.       Difficult abdominal exam given body habitus and coccyx pain causing patient to lie on side,  Unable to lie supine.  Musculoskeletal: Normal range of motion. He exhibits tenderness. He exhibits no edema.       TTP at coccyx.  Neurological: He is alert and oriented to person, place, and time. Coordination normal.  Skin: Skin is warm and dry.  Psychiatric: He has a normal mood and affect.    ED Course  Procedures (including critical care time)   Labs Reviewed  CBC  DIFFERENTIAL  COMPREHENSIVE METABOLIC PANEL  URINALYSIS, ROUTINE W REFLEX MICROSCOPIC     Recheck of patient, pain improved,  Nausea better.  Abdomen re-examined - benign abdomen.   MDM  Coccyx fracture. Nausea with abdominal pain,  Unclear etiology.  Normal labs,  Benign abdominal exam today.            Candis Musa, PA 04/04/11 0127

## 2011-04-04 LAB — URINALYSIS, ROUTINE W REFLEX MICROSCOPIC
Bilirubin Urine: NEGATIVE
Glucose, UA: NEGATIVE mg/dL
Ketones, ur: NEGATIVE mg/dL
Leukocytes, UA: NEGATIVE
Protein, ur: NEGATIVE mg/dL

## 2011-04-04 MED ORDER — ONDANSETRON HCL 8 MG PO TABS: 8.0000 mg | ORAL_TABLET | Freq: Four times a day (QID) | ORAL | Status: AC

## 2011-04-04 MED ORDER — HYDROCODONE-ACETAMINOPHEN 5-325 MG PO TABS: 1.0000 | ORAL_TABLET | ORAL | Status: AC | PRN

## 2011-04-04 MED ORDER — ONDANSETRON HCL 4 MG/2ML IJ SOLN
4.0000 mg | Freq: Once | INTRAMUSCULAR | Status: AC
  Administered 2011-04-04: 4 mg via INTRAVENOUS
  Filled 2011-04-04: qty 2

## 2011-04-04 NOTE — ED Provider Notes (Signed)
Medical screening examination/treatment/procedure(s) were conducted as a shared visit with non-physician practitioner(s) and myself.  I personally evaluated the patient during the encounter  Coccyx injury. Otherwise nausea without abdominal tenderness. His abdomen is benign. No indication for CT scan or additional testing at this time  I personally reviewed his xray  Lyanne Co, MD 04/04/11 [phone]

## 2011-04-07 NOTE — ED Provider Notes (Signed)
Medical screening examination/treatment/procedure(s) were performed by non-physician practitioner and as supervising physician I was immediately available for consultation/collaboration.   Joya Gaskins, MD 04/07/11 [phone]

## 2011-04-23 ENCOUNTER — Encounter: Payer: Self-pay | Admitting: Cardiology

## 2011-04-27 ENCOUNTER — Ambulatory Visit: Payer: Medicare Other | Admitting: Cardiology

## 2011-05-06 ENCOUNTER — Encounter: Payer: Self-pay | Admitting: Cardiology

## 2011-05-06 ENCOUNTER — Ambulatory Visit (INDEPENDENT_AMBULATORY_CARE_PROVIDER_SITE_OTHER): Payer: Medicare Other | Admitting: Cardiology

## 2011-05-06 ENCOUNTER — Other Ambulatory Visit: Payer: Self-pay | Admitting: Cardiology

## 2011-05-06 ENCOUNTER — Encounter: Payer: Self-pay | Admitting: *Deleted

## 2011-05-06 VITALS — BP 179/97 | HR 83 | Ht 73.0 in | Wt 345.0 lb

## 2011-05-06 DIAGNOSIS — M7989 Other specified soft tissue disorders: Secondary | ICD-10-CM

## 2011-05-06 DIAGNOSIS — I509 Heart failure, unspecified: Secondary | ICD-10-CM

## 2011-05-06 DIAGNOSIS — I10 Essential (primary) hypertension: Secondary | ICD-10-CM

## 2011-05-06 MED ORDER — CLONIDINE HCL 0.2 MG/24HR TD PTWK: 1.0000 | MEDICATED_PATCH | TRANSDERMAL | Status: DC

## 2011-05-06 NOTE — Patient Instructions (Signed)
Your physician recommends that you schedule a follow-up appointment in: 4 months with Dr Dietrich Pates and 2 months with nurse for Blood pressure check  Your physician has recommended you make the following change in your medication:  START Clonidine (catapress) patch 0.2 mg to skin weekly  Your physician recommends that you return for lab work in: 2 months (BMET)  Your physician has requested that you regularly monitor and record your blood pressure readings at home. Please use the same machine at the same time of day to check your readings and record them to bring to your follow-up visit.  Your physician has requested that you have a lower extremity venous duplex. This test is an ultrasound of the veins in the legs. It looks at venous blood flow that carries blood from the heart to the legs or arms. Allow one hour for a Lower Venous exam. Allow thirty minutes for an Upper Venous exam. There are no restrictions or special instructions.  Dash diet (Information attatched)

## 2011-05-06 NOTE — Assessment & Plan Note (Signed)
Blood pressure control is suboptimal; however, patient's use of medications sounds to be somewhat arbitrary.  He has been taking terazosin every other day.  It's not entirely clear that he recognized this was an antihypertensive medication.  He omitted lisinopril/HCT this morning.  Clonidine TTS level II will be added to his medications.  He will return in 2 months for blood pressure check with cardiology nurses and keep track of his pressures at home in the interim.  If he tolerates the patch well, it may be possible to discontinue one or more of his other antihypertensive drugs.

## 2011-05-06 NOTE — Assessment & Plan Note (Signed)
Patient congratulated on 6 pound weight loss and encouraged to continue to restrict calories and to be as active as possible.

## 2011-05-06 NOTE — Assessment & Plan Note (Signed)
No current signs or symptoms of congestive heart failure 

## 2011-05-06 NOTE — Progress Notes (Signed)
HPI: [name] returns the office as scheduled for continued assessment and treatment of hypertension.  Since his last visit, he unfortunately suffered a fall down a set of steps resulting in a fracture of the coccyx and multiple contusions including pain in his right leg.  He did not lose consciousness-the mechanism of the fall was failure of a stair on which he was resting his weight.  Symptoms have gradually subsided.  He continues to complain of daytime somnolence, which he attributes to his medications.  He has noticed a pronounced swelling in the right leg, but is not entirely clear whether this is a chronic problem or a new one.  Prior to Admission medications   Medication Sig Start Date End Date Taking? Authorizing Provider  albuterol-ipratropium (COMBIVENT) 18-103 MCG/ACT inhaler Inhale 2 puffs into the lungs every 6 (six) hours as needed. For shortness of breath  12/05/10  Yes Gerrit Friends. Tijana Walder, MD  amLODipine (NORVASC) 5 MG tablet Take 10 mg by mouth daily.   01/23/11  Yes Joni Reining, NP  beclomethasone (QVAR) 80 MCG/ACT inhaler Inhale 1 puff into the lungs as needed. For shortness of breath  12/05/10  Yes Gerrit Friends. Beonca Gibb, MD  terazosin (HYTRIN) 10 MG capsule Take 10 mg by mouth at bedtime as needed. Blood pressure  12/04/10  Yes Gerrit Friends. Jazzlynn Rawe, MD  lisinopril-hydrochlorothiazide (PRINZIDE,ZESTORETIC) 20-12.5 MG per tablet Take 1 tablet by mouth 2 (two) times daily.   12/04/10   Gerrit Friends. Dietrich Pates, MD    Allergies  Allergen Reactions  . Bee Venom Shortness Of Breath and Swelling  . Aspirin Other (See Comments)    Counteracts with current medication      Past medical history, social history, and family history reviewed and updated.  ROS: Denies orthopnea, PND, chest discomfort or syncope.  PHYSICAL EXAM: BP 179/97  Pulse 83  Ht 6\' 1"  (1.854 m)  Wt 156.491 kg (345 lb)  BMI 45.52 kg/m2  SpO2 97% ; weight decreased 6 pounds since his last visit; repeat blood pressure with  a large cuff-160/85  General-Well developed; no acute distress Body habitus-obese Neck-No JVD; no carotid bruits Lungs-clear lung fields; resonant to percussion; decreased breath sounds at the bases Cardiovascular-normal PMI; normal S1 and S2 Abdomen-normal bowel sounds; soft and non-tender without masses or organomegaly Musculoskeletal-No deformities, no cyanosis or clubbing Neurologic-Normal cranial nerves; symmetric strength and tone Skin-Warm, no significant lesions Extremities-distal pulses intact; 1+ pitting pretibial edema on the left; 2+ on the right; nodular areas of decreased tissue turgor over the left lower leg, possibly representing venous varicosities or lipomas.  EKG: Normal sinus rhythm; borderline left atrial abnormality; nonspecific T-wave abnormality; no change since previous tracing performed 01/30/08.  ASSESSMENT AND PLAN:   [place] Bing, MD 05/06/2011 12:02 PM

## 2011-05-08 ENCOUNTER — Ambulatory Visit (HOSPITAL_COMMUNITY)
Admission: RE | Admit: 2011-05-08 | Discharge: 2011-05-08 | Disposition: A | Discharge status: Home / Self Care | Payer: Medicare Other | Source: Ambulatory Visit | Attending: Cardiology | Admitting: Cardiology

## 2011-05-08 DIAGNOSIS — M7989 Other specified soft tissue disorders: Secondary | ICD-10-CM | POA: Insufficient documentation

## 2011-06-26 ENCOUNTER — Other Ambulatory Visit: Payer: Self-pay | Admitting: Cardiology

## 2011-06-26 ENCOUNTER — Other Ambulatory Visit: Payer: Self-pay

## 2011-06-26 MED ORDER — BECLOMETHASONE DIPROPIONATE 80 MCG/ACT IN AERS: 1.0000 | INHALATION_SPRAY | RESPIRATORY_TRACT | Status: DC | PRN

## 2011-06-26 MED ORDER — LISINOPRIL-HYDROCHLOROTHIAZIDE 20-12.5 MG PO TABS: 1.0000 | ORAL_TABLET | Freq: Two times a day (BID) | ORAL | Status: DC

## 2011-06-26 NOTE — Telephone Encounter (Signed)
ALSO NEEDS BOTH INHALERS CALLED IN. HE STATES THAT DR Dietrich Pates WROTE THE RX

## 2011-07-07 ENCOUNTER — Encounter: Payer: Self-pay | Admitting: *Deleted

## 2011-07-07 ENCOUNTER — Telehealth: Payer: Self-pay

## 2011-07-07 DIAGNOSIS — I10 Essential (primary) hypertension: Secondary | ICD-10-CM

## 2011-07-07 MED ORDER — CLONIDINE HCL 0.2 MG/24HR TD PTWK: 1.0000 | MEDICATED_PATCH | TRANSDERMAL | Status: DC

## 2011-07-07 NOTE — Telephone Encounter (Signed)
**Note De-Identified [name] Obfuscation** Pt. called office after leaving BP check this morning and stated that his car broke down so he cant have labs drawn or pick up RX for Clonidine patch until tomorrow./LV

## 2011-07-07 NOTE — Telephone Encounter (Signed)
**Note De-Identified Egan Sahlin Obfuscation** Pt. arrived in office for a 2 month BP check. On last OV with Dr. Dietrich Pates on 05/06/11 pt. was advised to start wearing Clonidine 0.2mg  patch, record and bring BP diary to BP check. Pt. has no complaints this morning. He did not start Clonidine patch, did not bring BP diary and states he has been eating french fries, sausage, hotdogs, bologna, etc. His BP this morning is 181/106 and on last OV BP was 179/97.  Pt. Is advised to pick up Clonidine patches at [facility] (reordered), monitor and record BP's to bring to next BP check which is scheduled on 07-23-11. Also, pt. Is advised to eat a healthy diet, not fried greasy foods (heart healthy diet handout given to pt. at this visit./LV

## 2011-07-09 LAB — COMPREHENSIVE METABOLIC PANEL
ALT: 28 U/L (ref 0–53)
CO2: 29 mEq/L (ref 19–32)
Calcium: 9 mg/dL (ref 8.4–10.5)
Chloride: 103 mEq/L (ref 96–112)
Creat: 1.02 mg/dL (ref 0.50–1.35)
Glucose, Bld: 87 mg/dL (ref 70–99)
Total Protein: 6.6 g/dL (ref 6.0–8.3)

## 2011-07-23 ENCOUNTER — Encounter: Payer: Medicare Other | Admitting: *Deleted

## 2011-07-23 ENCOUNTER — Encounter: Payer: Self-pay | Admitting: *Deleted

## 2011-07-23 ENCOUNTER — Ambulatory Visit (INDEPENDENT_AMBULATORY_CARE_PROVIDER_SITE_OTHER): Payer: Medicare Other | Admitting: *Deleted

## 2011-07-23 VITALS — BP 180/101 | HR 87 | Ht 73.0 in | Wt 346.1 lb

## 2011-07-23 DIAGNOSIS — I10 Essential (primary) hypertension: Secondary | ICD-10-CM

## 2011-07-23 NOTE — Progress Notes (Signed)
Patient presents for blood pressure check.  States that he is taking medication as prescribed, with the exception of 5 mg of Amlodipine rather than 10 mg.  Blood pressure checked twice.  States he has been watching salt intake.  Has not had his medications this am.  Catapress patch in place.  Advised patient to Increase Amlodipine to 10 mg as ordered and I will call him with further recommendations.

## 2011-07-25 NOTE — Progress Notes (Signed)
Patient ID: [name], male   DOB: June 08, 1966, 46 y.o.   MRN: [id number] Agree with plans for a change in blood pressure medication. Verify that he is filling prescriptions appropriately. Emphasized that he needs to take medication as ordered including on days when blood pressure is to be checked in the office. Home BPs Blood pressure check in one month.

## 2011-07-27 ENCOUNTER — Telehealth: Payer: Self-pay | Admitting: *Deleted

## 2011-07-27 NOTE — Telephone Encounter (Signed)
Patient notified of Dr Marvel Plan recommendations regarding management of HTN and will follow up in office in 1 month for a blood pressure check.

## 2011-08-24 ENCOUNTER — Ambulatory Visit (INDEPENDENT_AMBULATORY_CARE_PROVIDER_SITE_OTHER): Payer: Medicare Other | Admitting: *Deleted

## 2011-08-24 VITALS — BP 185/102 | HR 86 | Ht 73.0 in | Wt 353.0 lb

## 2011-08-24 DIAGNOSIS — I10 Essential (primary) hypertension: Secondary | ICD-10-CM

## 2011-08-24 NOTE — Progress Notes (Signed)
Presents for blood pressure check.  No acute distress noted at this time.  Did not bring a list of blood pressures, however assures me his pressures are in the 140-130 range, which is not at all evidenced by today's readings.  Blood pressure taken 3 times and was confirmed to be extremely high.  Patient states that he has only had lisinopril today, which was taken at 230 this am and has not applied clonidine patch, which was removed on Saturday.  He has been "spacing out medications" stating that if he takes any two at the same time, he does not feel good. Advised patient that in order to control this pressure and decrease risk of complications secondary to hypertension he was going to have to show effort to be compliant with his medications.  Advised him to take medications as prescribed, take bp daily and I will call him with any other suggestions.  Has had 6 nurse visits in the last 9 months and has shown non-compliance with diet and medications at each visit.

## 2011-08-25 NOTE — Progress Notes (Signed)
Patient is already scheduled for follow up and is non compliant with his medications.

## 2011-08-25 NOTE — Progress Notes (Signed)
Patient ID: [name], male   DOB: [birth year], 46 y.o.   MRN: [id number]  Call patient's pharmacy and determine compliance with medical regime. Due for return office visit. Please schedule.

## 2011-09-07 ENCOUNTER — Ambulatory Visit: Payer: Medicare Other | Admitting: Cardiology

## 2011-09-17 ENCOUNTER — Telehealth: Payer: Self-pay | Admitting: Cardiology

## 2011-09-17 NOTE — Telephone Encounter (Signed)
PT STATES THAT HIS MEDICATION IS MAKING HIS STOMIC AND SIDES HURT SO BAD HE STOPPED TAKING THEM A COUPLE DAYS AGO.

## 2011-09-17 NOTE — Telephone Encounter (Signed)
SPOKE WITH PATIENT REGARDING DISCONTINUATION OF, WHAT HE DESCRIBES AS "ALL OF THEM"  DUE TO STOMACH UPSET.  PATIENT HAS AN APPT WITH DR Dietrich Pates ON THE 4TH AND HE WILL ADDRESS AT THAT VISIT.

## 2011-09-24 ENCOUNTER — Ambulatory Visit: Payer: Medicare Other | Admitting: Cardiology

## 2011-10-08 ENCOUNTER — Ambulatory Visit (INDEPENDENT_AMBULATORY_CARE_PROVIDER_SITE_OTHER): Payer: Medicare Other | Admitting: Cardiology

## 2011-10-08 ENCOUNTER — Encounter: Payer: Self-pay | Admitting: Cardiology

## 2011-10-08 VITALS — BP 174/98 | HR 89 | Ht 72.0 in | Wt 352.0 lb

## 2011-10-08 DIAGNOSIS — G4730 Sleep apnea, unspecified: Secondary | ICD-10-CM

## 2011-10-08 DIAGNOSIS — I10 Essential (primary) hypertension: Secondary | ICD-10-CM

## 2011-10-08 MED ORDER — CLONIDINE HCL 0.2 MG/24HR TD PTWK: 1.0000 | MEDICATED_PATCH | TRANSDERMAL | Status: DC

## 2011-10-08 NOTE — Progress Notes (Signed)
Name: [name]    DOB: [birth year]  Age: 46 y.o.  MR#: [id number]       PCP:  Mattawan Bing, MD, MD      Insurance: @PAYORNAME @   CC:    Chief Complaint  Patient presents with  . Appointment    HTN, pt states he had stomach pain so he stopped his meds except Lisinopril HCTZ    VS BP 174/98  Pulse 89  Ht 6' (1.829 m)  Wt 352 lb (159.666 kg)  BMI 47.74 kg/m2  Weights Current Weight  10/08/11 352 lb (159.666 kg)  08/24/11 353 lb (160.12 kg)  07/23/11 346 lb 1.9 oz (156.999 kg)    Blood Pressure  BP Readings from Last 3 Encounters:  10/08/11 174/98  08/24/11 185/102  07/23/11 180/101     Admit date:  (Not on file) Last encounter with RMR:  09/17/2011   Allergy Allergies  Allergen Reactions  . Bee Venom Shortness Of Breath and Swelling  . Aspirin Other (See Comments)    Counteracts with current medication    Current Outpatient Prescriptions  Medication Sig Dispense Refill  . lisinopril-hydrochlorothiazide (PRINZIDE,ZESTORETIC) 20-12.5 MG per tablet Take 1 tablet by mouth 2 (two) times daily.  60 tablet  3    Discontinued Meds:    Medications Discontinued During This Encounter  Medication Reason  . albuterol-ipratropium (COMBIVENT) 18-103 MCG/ACT inhaler Patient has not taken in last 30 days  . amLODipine (NORVASC) 5 MG tablet Patient has not taken in last 30 days  . beclomethasone (QVAR) 80 MCG/ACT inhaler Patient has not taken in last 30 days  . cloNIDine (CATAPRES - DOSED IN MG/24 HR) 0.2 mg/24hr patch Patient has not taken in last 30 days  . terazosin (HYTRIN) 10 MG capsule Patient has not taken in last 30 days    Patient Active Problem List  Diagnoses  . HYPERLIPIDEMIA  . OBESITY, MORBID  . ANXIETY  . DEPRESSION  . HYPERTENSION  . CONGESTIVE HEART FAILURE  . ALLERGIC RHINITIS  . ASTHMA  . GERD  . PEPTIC ULCER DISEASE  . OSTEOARTHRITIS  . SLEEP APNEA    LABS No visits with results within 3 Month(s) from this visit. Latest known visit with  results is:  Appointment on 07/07/2011  Component Date Value  . Sodium 07/07/2011 141   . Potassium 07/07/2011 3.7   . Chloride 07/07/2011 103   . CO2 07/07/2011 29   . Glucose, Bld 07/07/2011 87   . BUN 07/07/2011 11   . Creat 07/07/2011 1.02   . Total Bilirubin 07/07/2011 0.6   . Alkaline Phosphatase 07/07/2011 54   . AST 07/07/2011 25   . ALT 07/07/2011 28   . Total Protein 07/07/2011 6.6   . Albumin 07/07/2011 4.3   . Calcium 07/07/2011 9.0      Results for this Opt Visit:     Results for orders placed in visit on 07/07/11  COMPREHENSIVE METABOLIC PANEL      Component Value Range   Sodium 141  135 - 145 (mEq/L)   Potassium 3.7  3.5 - 5.3 (mEq/L)   Chloride 103  96 - 112 (mEq/L)   CO2 29  19 - 32 (mEq/L)   Glucose, Bld 87  70 - 99 (mg/dL)   BUN 11  6 - 23 (mg/dL)   Creat 5.78  4.69 - 6.29 (mg/dL)   Total Bilirubin 0.6  0.3 - 1.2 (mg/dL)   Alkaline Phosphatase 54  39 - 117 (U/L)   AST  25  0 - 37 (U/L)   ALT 28  0 - 53 (U/L)   Total Protein 6.6  6.0 - 8.3 (g/dL)   Albumin 4.3  3.5 - 5.2 (g/dL)   Calcium 9.0  8.4 - 16.1 (mg/dL)    EKG Orders placed in visit on 05/06/11  . EKG 12-LEAD     Prior Assessment and Plan Problem List as of 10/08/2011          Cardiology Problems   HYPERLIPIDEMIA   Last Assessment & Plan Note   11/28/2010 Office Visit Signed 11/28/2010  3:47 PM by Kathlen Brunswick, MD    Most recent lipid profile in 2010 was somewhat suboptimal, but adequate in light of the patient's absence of known vascular disease.  A repeat study will be obtained.    HYPERTENSION   Last Assessment & Plan Note   05/06/2011 Office Visit Signed 05/06/2011  1:09 PM by Kathlen Brunswick, MD    Blood pressure control is suboptimal; however, patient's use of medications sounds to be somewhat arbitrary.  He has been taking terazosin every other day.  It's not entirely clear that he recognized this was an antihypertensive medication.  He omitted lisinopril/HCT this morning.   Clonidine TTS level II will be added to his medications.  He will return in 2 months for blood pressure check with cardiology nurses and keep track of his pressures at home in the interim.  If he tolerates the patch well, it may be possible to discontinue one or more of his other antihypertensive drugs.    CONGESTIVE HEART FAILURE   Last Assessment & Plan Note   05/06/2011 Office Visit Signed 05/06/2011  1:04 PM by Kathlen Brunswick, MD    No current signs or symptoms of congestive heart failure.      Other   OBESITY, MORBID   Last Assessment & Plan Note   05/06/2011 Office Visit Signed 05/06/2011  1:09 PM by Kathlen Brunswick, MD    Patient congratulated on 6 pound weight loss and encouraged to continue to restrict calories and to be as active as possible.    ANXIETY   DEPRESSION   ALLERGIC RHINITIS   ASTHMA   Last Assessment & Plan Note   11/28/2010 Office Visit Signed 11/28/2010  3:49 PM by Kathlen Brunswick, MD    Patient has required an inhaler for treatment of an exacerbation of his asthma in recent months.  This is another reason to discontinue beta blocker treatment.    GERD   PEPTIC ULCER DISEASE   OSTEOARTHRITIS   SLEEP APNEA       Imaging: No results found.   FRS Calculation: Score not calculated. Missing: Total Cholesterol

## 2011-10-08 NOTE — Assessment & Plan Note (Signed)
Patient would likely benefit from a CPAP device, but does not appear motivated to obtain one.

## 2011-10-08 NOTE — Progress Notes (Signed)
Patient ID: [name], male   DOB: 1966-03-08, 46 y.o.   MRN: [id number]  HPI: Scheduled return visit for this unfortunate young gentleman with severe hypertension and problems tolerating and taking appropriate medication.  Since his last visit, he developed abdominal discomfort, which he attributed to his antihypertensive medication.  He stopped amlodipine and transdermal clonidine, but has continued lisinopril/HCT.  He monitors blood pressure at home with values typically in the 150-170 range systolic.  Diastolics are 90-100.  Patient has a history of sleep apnea and claims to sleep only 3-4 hours per night.  He was referred for a positive pressure mask, but never pursued obtaining one.  When I entered the exam room, had fallen asleep in his chair, but was not snoring.  Prior to Admission medications   Medication Sig Start Date End Date Taking? Authorizing Provider  lisinopril-hydrochlorothiazide (PRINZIDE,ZESTORETIC) 20-12.5 MG per tablet Take 1 tablet by mouth 2 (two) times daily. 06/26/11  Yes Kathlen Brunswick, MD  cloNIDine (CATAPRES - DOSED IN MG/24 HR) 0.2 mg/24hr patch Place 1 patch (0.2 mg total) onto the skin once a week. 10/08/11 10/07/12  Kathlen Brunswick, MD   Allergies  Allergen Reactions  . Bee Venom Shortness Of Breath and Swelling  . Aspirin Other (See Comments)    Counteracts with current medication     Past medical history, social history, and family history reviewed and updated.  ROS: Denies dyspnea, orthopnea, PND, pedal edema, palpitations, lightheadedness or syncope.  All other systems reviewed and are negative.  PHYSICAL EXAM: BP 174/98  Pulse 89  Ht 6' (1.829 m)  Wt 159.666 kg (352 lb)  BMI 47.74 kg/m2 ; weight has increased 6 pounds since 06/2011 General-Well developed; no acute distress Body habitus-Obese Neck-No JVD; no carotid bruits Lungs-clear lung fields with decreased breath sounds at the bases; resonant to percussion Cardiovascular-normal PMI; Distant S1  and S2 Abdomen-normal bowel sounds; soft and non-tender without masses or organomegaly Musculoskeletal-No deformities, no cyanosis or clubbing Neurologic-Normal cranial nerves; symmetric strength and tone Skin-Warm, no significant lesions Extremities-distal pulses intact; no edema; large cyst or lipoma over the right ankle  ASSESSMENT AND PLAN:  Samuel Pines Bing, MD 10/08/2011 2:33 PM

## 2011-10-08 NOTE — Assessment & Plan Note (Addendum)
It is quite unfortunate that [name]'s hypertension cannot be adequately controlled as a result of his approach and concerns about his medical therapy.  I explained that it's important he notify us immediately when he stops medication so that we can seek substitutes.  He does not believe the transdermal clonidine was problematic, so this medication will be reinstituted.  He is advised to maintain a list of blood pressures at home and to come to monthly visits with the cardiology nurses to review these, review his experience with medication and to modify his medical therapy as necessary.  I will plan to see him again in 6 months.

## 2011-10-08 NOTE — Patient Instructions (Signed)
Your physician recommends that you schedule a follow-up appointment in:  1 - 6 month follow up with Dr Dietrich Pates 2 - Blood pressure check every month x 5 months   Your physician has requested that you regularly monitor and record your blood pressure readings at home. Please use the same machine at the same time of day to check your readings and record them to bring to [facility] follow-up visit.  Your physician has recommended you make the following change in your medication:  1 - Resume Catapress Patch 0.2 weekly

## 2011-11-22 ENCOUNTER — Other Ambulatory Visit: Payer: Self-pay | Admitting: Cardiology

## 2011-12-03 ENCOUNTER — Ambulatory Visit: Payer: Medicare Other | Admitting: Cardiology

## 2011-12-10 ENCOUNTER — Encounter: Payer: Self-pay | Admitting: Cardiology

## 2011-12-10 ENCOUNTER — Ambulatory Visit (INDEPENDENT_AMBULATORY_CARE_PROVIDER_SITE_OTHER): Payer: Medicare Other | Admitting: Cardiology

## 2011-12-10 VITALS — BP 180/90 | HR 91 | Ht 72.0 in | Wt 347.0 lb

## 2011-12-10 DIAGNOSIS — I10 Essential (primary) hypertension: Secondary | ICD-10-CM

## 2011-12-10 MED ORDER — LABETALOL HCL 200 MG PO TABS: 200.0000 mg | ORAL_TABLET | Freq: Two times a day (BID) | ORAL | Status: DC

## 2011-12-10 NOTE — Patient Instructions (Addendum)
Your physician recommends that you schedule a follow-up appointment in:  1 - Follow up in October with Dr Dietrich Pates as planned - You will receive a letter 2 - BP check in 1 month  Your physician has recommended you make the following change in your medication:  1 - START Labetalol 200 mg twice a day 2 - Continue Lisinopril 3 - MAY discontinue Clonidine and Hytrin

## 2011-12-10 NOTE — Progress Notes (Deleted)
Name: [name]    DOB: 06/28/1965  Age: 46 y.o.  MR#: [id number]       PCP:   Bing, MD      Insurance: @PAYORNAME @   CC:    Chief Complaint  Patient presents with  . Non compliance - Hypertension    States BP meds make stomach hurt, therefore stopped - Med bottles/TC    VS BP 180/90  Pulse 91  Ht 6' (1.829 m)  Wt 347 lb (157.398 kg)  BMI 47.06 kg/m2  SpO2 100%  Weights Current Weight  12/10/11 347 lb (157.398 kg)  10/08/11 352 lb (159.666 kg)  08/24/11 353 lb (160.12 kg)    Blood Pressure  BP Readings from Last 3 Encounters:  12/10/11 180/90  10/08/11 174/98  08/24/11 185/102     Admit date:  (Not on file) Last encounter with RMR:  11/22/2011   Allergy Allergies  Allergen Reactions  . Bee Venom Shortness Of Breath and Swelling  . Aspirin Other (See Comments)    Counteracts with current medication    Current Outpatient Prescriptions  Medication Sig Dispense Refill  . albuterol-ipratropium (COMBIVENT) 18-103 MCG/ACT inhaler Inhale 2 puffs into the lungs every 6 (six) hours as needed.      . beclomethasone (QVAR) 80 MCG/ACT inhaler Inhale 1 puff into the lungs as needed.      Marland Kitchen lisinopril-hydrochlorothiazide (PRINZIDE,ZESTORETIC) 20-12.5 MG per tablet Take 1 tablet by mouth 2 (two) times daily.  60 tablet  3  . cloNIDine (CATAPRES - DOSED IN MG/24 HR) 0.2 mg/24hr patch Place 1 patch (0.2 mg total) onto the skin once a week.  4 patch  12  . terazosin (HYTRIN) 10 MG capsule Take 10 mg by mouth at bedtime.      Marland Kitchen DISCONTD: lisinopril-hydrochlorothiazide (PRINZIDE,ZESTORETIC) 20-12.5 MG per tablet TAKE 1 TABLET TWICE A DAY  60 tablet  3    Discontinued Meds:    Medications Discontinued During This Encounter  Medication Reason  . lisinopril-hydrochlorothiazide (PRINZIDE,ZESTORETIC) 20-12.5 MG per tablet Duplicate    Patient Active Problem List  Diagnosis  . HYPERLIPIDEMIA  . OBESITY, MORBID  . ANXIETY  . DEPRESSION  . HYPERTENSION  . CONGESTIVE  HEART FAILURE  . ALLERGIC RHINITIS  . ASTHMA  . GERD  . PEPTIC ULCER DISEASE  . OSTEOARTHRITIS  . SLEEP APNEA    LABS No visits with results within 3 Month(s) from this visit. Latest known visit with results is:  Appointment on 07/07/2011  Component Date Value  . Sodium 07/07/2011 141   . Potassium 07/07/2011 3.7   . Chloride 07/07/2011 103   . CO2 07/07/2011 29   . Glucose, Bld 07/07/2011 87   . BUN 07/07/2011 11   . Creat 07/07/2011 1.02   . Total Bilirubin 07/07/2011 0.6   . Alkaline Phosphatase 07/07/2011 54   . AST 07/07/2011 25   . ALT 07/07/2011 28   . Total Protein 07/07/2011 6.6   . Albumin 07/07/2011 4.3   . Calcium 07/07/2011 9.0      Results for this Opt Visit:     Results for orders placed in visit on 07/07/11  COMPREHENSIVE METABOLIC PANEL      Component Value Range   Sodium 141  135 - 145 mEq/L   Potassium 3.7  3.5 - 5.3 mEq/L   Chloride 103  96 - 112 mEq/L   CO2 29  19 - 32 mEq/L   Glucose, Bld 87  70 - 99 mg/dL   BUN 11  6 - 23 mg/dL   Creat 4.54  0.98 - 1.19 mg/dL   Total Bilirubin 0.6  0.3 - 1.2 mg/dL   Alkaline Phosphatase 54  39 - 117 U/L   AST 25  0 - 37 U/L   ALT 28  0 - 53 U/L   Total Protein 6.6  6.0 - 8.3 g/dL   Albumin 4.3  3.5 - 5.2 g/dL   Calcium 9.0  8.4 - 14.7 mg/dL    EKG Orders placed in visit on 05/06/11  . EKG 12-LEAD     Prior Assessment and Plan Problem List as of 12/10/2011            Cardiology Problems   HYPERLIPIDEMIA   Last Assessment & Plan Note   11/28/2010 Office Visit Signed 11/28/2010  3:47 PM by Kathlen Brunswick, MD    Most recent lipid profile in 2010 was somewhat suboptimal, but adequate in light of the patient's absence of known vascular disease.  A repeat study will be obtained.    HYPERTENSION   Last Assessment & Plan Note   10/08/2011 Office Visit Addendum 10/08/2011  3:47 PM by Kathlen Brunswick, MD    It is quite unfortunate that Mr. Revolorio's hypertension cannot be adequately controlled as a result  of his approach and concerns about his medical therapy.  I explained that it's important he notify us immediately when he stops medication so that we can seek substitutes.  He does not believe the transdermal clonidine was problematic, so this medication will be reinstituted.  He is advised to maintain a list of blood pressures at home and to come to monthly visits with the cardiology nurses to review these, review his experience with medication and to modify his medical therapy as necessary.  I will plan to see him again in 6 months.    CONGESTIVE HEART FAILURE   Last Assessment & Plan Note   05/06/2011 Office Visit Signed 05/06/2011  1:04 PM by Kathlen Brunswick, MD    No current signs or symptoms of congestive heart failure.      Other   OBESITY, MORBID   Last Assessment & Plan Note   05/06/2011 Office Visit Signed 05/06/2011  1:09 PM by Kathlen Brunswick, MD    Patient congratulated on 6 pound weight loss and encouraged to continue to restrict calories and to be as active as possible.    ANXIETY   DEPRESSION   ALLERGIC RHINITIS   ASTHMA   Last Assessment & Plan Note   11/28/2010 Office Visit Signed 11/28/2010  3:49 PM by Kathlen Brunswick, MD    Patient has required an inhaler for treatment of an exacerbation of his asthma in recent months.  This is another reason to discontinue beta blocker treatment.    GERD   PEPTIC ULCER DISEASE   OSTEOARTHRITIS   SLEEP APNEA   Last Assessment & Plan Note   10/08/2011 Office Visit Signed 10/08/2011  2:39 PM by Kathlen Brunswick, MD    Patient would likely benefit from a CPAP device, but does not appear motivated to obtain one.        Imaging: No results found.   FRS Calculation: Score not calculated. Missing: Total Cholesterol

## 2011-12-10 NOTE — Assessment & Plan Note (Addendum)
Patient has long-standing severe hypertension with a previous episode of congestive heart failure, but no recent symptomatic manifestations of his disease.  He has been informed of the serious morbidity that can result from uncontrolled hypertension.  Since he has such difficulty with medical therapy, I raised the issue of radiofrequency ablation of the sympathetic renal nerves.  He was very fearful of that procedure as described and would not consider it.  I advised patient that we are not accomplishing much in this relationship and that we will need to terminate it if he continues to fail to comply with his medical regime.  For him, that involves taking medication on a daily basis and reporting any adverse effects to [place].  All of his current medication except lisinopril/HCT will be discontinued.  Labetalol 200 mg twice a day will be started.  Patient is advised to monitor pressure at home and will bring a list in one month for review.  I will see him again as scheduled in approximately 2 months.  Counseling occupied more than 50% of this 30 minute visit.

## 2011-12-10 NOTE — Progress Notes (Signed)
Patient ID: [name], male   DOB: 02-Sep-1965, 46 y.o.   MRN: [id number]  HPI: Scheduled return visit for this nice gentleman, who was asked to return early due to noncompliance resulting in failure to adequately control hypertension.  Patient has suffered adverse effects of multiple medications including, but not limited to, transdermal clonidine, metoprolol, amlodipine and Hytrin.  In most cases, the issue is abdominal discomfort.  Clonidine causes skin irritation.  BP is typically approximately 180/100.  Prior to Admission medications   Medication Sig Start Date End Date Taking? Authorizing Provider  albuterol-ipratropium (COMBIVENT) 18-103 MCG/ACT inhaler Inhale 2 puffs into the lungs every 6 (six) hours as needed.   Yes Historical Provider, MD  beclomethasone (QVAR) 80 MCG/ACT inhaler Inhale 1 puff into the lungs as needed.   Yes Historical Provider, MD  lisinopril-hydrochlorothiazide (PRINZIDE,ZESTORETIC) 20-12.5 MG per tablet Take 1 tablet by mouth 2 (two) times daily. 06/26/11  Yes Kathlen Brunswick, MD  labetalol (NORMODYNE) 200 MG tablet Take 1 tablet (200 mg total) by mouth 2 (two) times daily. 12/10/11 12/09/12  Kathlen Brunswick, MD   Allergies  Allergen Reactions  . Bee Venom Shortness Of Breath and Swelling  . Aspirin Other (See Comments)    Counteracts with current medication     Past medical history, social history, and family history reviewed and updated.  ROS: Denies orthopnea, PND, exertional dyspnea or chest pain.  He has chronic abdominal discomfort without nausea, emesis, weight loss, constipation or diarrhea.  All other systems reviewed and are negative.  PHYSICAL EXAM: BP 180/90  Pulse 91  Ht 6' (1.829 m)  Wt 157.398 kg (347 lb)  BMI 47.06 kg/m2  SpO2 100%   General-Well developed; no acute distress Body habitus-proportionate weight and height Neck-No JVD; no carotid bruits Lungs-clear lung fields; resonant to percussion Cardiovascular-normal PMI; normal S1 and  S2 with pulse irregularity and S4 Abdomen-normal bowel sounds; soft and non-tender without masses or organomegaly Musculoskeletal-Large lipoma over left lateral malleolus, no cyanosis or clubbing Neurologic-Normal cranial nerves; symmetric strength and tone Skin-Warm, no significant lesions Extremities-distal pulses intact; no edema  Rhythm Strip: Normal sinus rhythm with occasional PVC.  ASSESSMENT AND PLAN:  [place] Bing, MD 12/10/2011 6:23 PM

## 2012-02-19 ENCOUNTER — Ambulatory Visit (INDEPENDENT_AMBULATORY_CARE_PROVIDER_SITE_OTHER): Payer: Medicare Other | Admitting: *Deleted

## 2012-02-19 VITALS — BP 173/108 | HR 83 | Ht 72.0 in

## 2012-02-19 DIAGNOSIS — I10 Essential (primary) hypertension: Secondary | ICD-10-CM

## 2012-02-19 NOTE — Progress Notes (Signed)
Patient presents for blood pressure check today.  Continues to be non- compliant with his medications and states taking meds once or twice a week.  Still c/o upset stomach, which I advised him may need to be addressed by his primary care.  He also states labetalol "gives him palpitations" and he is not taking.  I told him that he is at high risk for stroke and kidney disease if he continues to remain non-compliant with his medications.  Verbalizes understanding, however refuses to follow instructions, due to side effects.

## 2012-03-09 ENCOUNTER — Other Ambulatory Visit: Payer: Self-pay | Admitting: *Deleted

## 2012-03-09 DIAGNOSIS — R109 Unspecified abdominal pain: Secondary | ICD-10-CM

## 2012-03-09 NOTE — Progress Notes (Signed)
Patient ID: [name], male   DOB: 1966-01-25, 46 y.o.   MRN: [id number]  Patient continues to advance abdominal pain as a reason for noncompliance with antihypertensive medications. He will be referred to gastroenterology for assessment of these symptoms.

## 2012-03-09 NOTE — Progress Notes (Signed)
Referral to Dr Claudie Leach made, via [facility].  Attempted to contact patient, to make him aware.  Message left for a return call.

## 2012-03-31 ENCOUNTER — Ambulatory Visit (INDEPENDENT_AMBULATORY_CARE_PROVIDER_SITE_OTHER): Payer: Medicare Other | Admitting: Internal Medicine

## 2012-11-27 ENCOUNTER — Emergency Department (HOSPITAL_COMMUNITY): Payer: Medicare Other

## 2012-11-27 ENCOUNTER — Encounter (HOSPITAL_COMMUNITY): Payer: Self-pay | Admitting: Emergency Medicine

## 2012-11-27 ENCOUNTER — Emergency Department (HOSPITAL_COMMUNITY)
Admission: EM | Admit: 2012-11-27 | Discharge: 2012-11-27 | Disposition: A | Discharge status: Home / Self Care | Payer: Medicare Other | Attending: Emergency Medicine | Admitting: Emergency Medicine

## 2012-11-27 DIAGNOSIS — R509 Fever, unspecified: Secondary | ICD-10-CM | POA: Insufficient documentation

## 2012-11-27 DIAGNOSIS — Z8711 Personal history of peptic ulcer disease: Secondary | ICD-10-CM | POA: Insufficient documentation

## 2012-11-27 DIAGNOSIS — IMO0001 Reserved for inherently not codable concepts without codable children: Secondary | ICD-10-CM | POA: Insufficient documentation

## 2012-11-27 DIAGNOSIS — M2550 Pain in unspecified joint: Secondary | ICD-10-CM | POA: Insufficient documentation

## 2012-11-27 DIAGNOSIS — Z8639 Personal history of other endocrine, nutritional and metabolic disease: Secondary | ICD-10-CM | POA: Insufficient documentation

## 2012-11-27 DIAGNOSIS — R350 Frequency of micturition: Secondary | ICD-10-CM | POA: Insufficient documentation

## 2012-11-27 DIAGNOSIS — Z87891 Personal history of nicotine dependence: Secondary | ICD-10-CM | POA: Insufficient documentation

## 2012-11-27 DIAGNOSIS — R5381 Other malaise: Secondary | ICD-10-CM | POA: Insufficient documentation

## 2012-11-27 DIAGNOSIS — J029 Acute pharyngitis, unspecified: Secondary | ICD-10-CM | POA: Insufficient documentation

## 2012-11-27 DIAGNOSIS — R5383 Other fatigue: Secondary | ICD-10-CM | POA: Insufficient documentation

## 2012-11-27 DIAGNOSIS — Z862 Personal history of diseases of the blood and blood-forming organs and certain disorders involving the immune mechanism: Secondary | ICD-10-CM | POA: Insufficient documentation

## 2012-11-27 DIAGNOSIS — R630 Anorexia: Secondary | ICD-10-CM | POA: Insufficient documentation

## 2012-11-27 DIAGNOSIS — I509 Heart failure, unspecified: Secondary | ICD-10-CM | POA: Insufficient documentation

## 2012-11-27 DIAGNOSIS — Z8739 Personal history of other diseases of the musculoskeletal system and connective tissue: Secondary | ICD-10-CM | POA: Insufficient documentation

## 2012-11-27 DIAGNOSIS — I110 Hypertensive heart disease with heart failure: Secondary | ICD-10-CM | POA: Insufficient documentation

## 2012-11-27 DIAGNOSIS — J189 Pneumonia, unspecified organism: Secondary | ICD-10-CM

## 2012-11-27 DIAGNOSIS — Z79899 Other long term (current) drug therapy: Secondary | ICD-10-CM | POA: Insufficient documentation

## 2012-11-27 DIAGNOSIS — Z8719 Personal history of other diseases of the digestive system: Secondary | ICD-10-CM | POA: Insufficient documentation

## 2012-11-27 DIAGNOSIS — J159 Unspecified bacterial pneumonia: Secondary | ICD-10-CM | POA: Insufficient documentation

## 2012-11-27 HISTORY — DX: Heart failure, unspecified: I50.9

## 2012-11-27 LAB — GLUCOSE, CAPILLARY: Glucose-Capillary: 110 mg/dL — ABNORMAL HIGH (ref 70–99)

## 2012-11-27 MED ORDER — LEVOFLOXACIN 750 MG PO TABS: 750.0000 mg | ORAL_TABLET | Freq: Every day | ORAL | Status: DC

## 2012-11-27 MED ORDER — ALBUTEROL SULFATE (5 MG/ML) 0.5% IN NEBU
5.0000 mg | INHALATION_SOLUTION | Freq: Once | RESPIRATORY_TRACT | Status: AC
  Administered 2012-11-27: 5 mg via RESPIRATORY_TRACT
  Filled 2012-11-27: qty 1

## 2012-11-27 MED ORDER — ALBUTEROL SULFATE HFA 108 (90 BASE) MCG/ACT IN AERS
2.0000 | INHALATION_SPRAY | Freq: Once | RESPIRATORY_TRACT | Status: AC
  Administered 2012-11-27: 2 via RESPIRATORY_TRACT
  Filled 2012-11-27: qty 6.7

## 2012-11-27 NOTE — ED Notes (Signed)
Pt c/o productive cough with clear/yellow sputum x 1 week. Pt also c/o frequent urination/dry mouth/and "nervous feeling" since last night. nad noted.

## 2012-11-27 NOTE — ED Notes (Signed)
Pt unable to void at this time. 

## 2012-11-27 NOTE — ED Notes (Signed)
Pt ambulated to restroom. 

## 2012-11-27 NOTE — ED Provider Notes (Signed)
History     CSN: [id number]  Arrival date & time 11/27/12  4098   First MD Initiated Contact with Patient 11/27/12 [phone]      Chief Complaint  Patient presents with  . Cough    (Consider location/radiation/quality/duration/timing/severity/associated sxs/prior treatment) HPI Comments: 47 year old morbidly obese male with a past medical history of hypertension, hyperlipidemia, CHF and GERD presents to the emergency department complaining of productive cough with yellow phlegm x1 week. Admits to associated subjective fever and chills, fatigue and body aches. He has not had any alleviating factors for his symptoms. States last night he got very hot and felt nervous, cough worsened and decided to get checked out. Also complaining of increased urinary frequency over the past week with dark colored urine. Admits to decreased appetite and decreased fluid intake. Denies chest pain or shortness of breath. No sick contacts.  Patient is a 47 y.o. male presenting with cough. The history is provided by the patient.  Cough Associated symptoms: chills, fever, myalgias and sore throat   Associated symptoms: no chest pain, no shortness of breath and no wheezing     Past Medical History  Diagnosis Date  . Hypertensive heart disease     Hypertensive heart disease with prior episodes of CHF  . Hyperlipidemia   . Hypertension   . Gastroesophageal reflux disease   . Osteoarthritis   . Peptic ulcer disease   . Morbid obesity     /sleep apnea  . Allergic rhinitis     plus h/o asthma  . Tobacco abuse, in remission     Cigarettes discontinued in 2010  . CHF (congestive heart failure)     Past Surgical History  Procedure Laterality Date  . None      History reviewed. No pertinent family history.  History  Substance Use Topics  . Smoking status: Former Smoker    Quit date: 06/23/2007  . Smokeless tobacco: Former Neurosurgeon    Quit date: 01/22/2006     Comment: 1 ppd former smoker  . Alcohol Use:  Yes     Comment: occasional      Review of Systems  Constitutional: Positive for fever, chills, appetite change and fatigue.  HENT: Positive for sore throat.   Respiratory: Positive for cough. Negative for shortness of breath and wheezing.   Cardiovascular: Negative for chest pain.  Gastrointestinal: Negative for nausea and vomiting.  Genitourinary: Positive for frequency and decreased urine volume. Negative for dysuria, hematuria and flank pain.  Musculoskeletal: Positive for myalgias and arthralgias.  All other systems reviewed and are negative.    Allergies  Bee venom and Aspirin  Home Medications   Current Outpatient Rx  Name  Route  Sig  Dispense  Refill  . albuterol-ipratropium (COMBIVENT) 18-103 MCG/ACT inhaler   Inhalation   Inhale 2 puffs into the lungs every 6 (six) hours as needed.         . beclomethasone (QVAR) 80 MCG/ACT inhaler   Inhalation   Inhale 1 puff into the lungs as needed.         . labetalol (NORMODYNE) 200 MG tablet   Oral   Take 1 tablet (200 mg total) by mouth 2 (two) times daily.   60 tablet   12   . lisinopril-hydrochlorothiazide (PRINZIDE,ZESTORETIC) 20-12.5 MG per tablet   Oral   Take by mouth.           BP 149/92  Pulse 93  Temp(Src) 99.5 F (37.5 C)  Resp 22  Ht 6' (1.829  m)  Wt 345 lb (156.491 kg)  BMI 46.78 kg/m2  SpO2 93%  Physical Exam  Nursing note and vitals reviewed. Constitutional: He is oriented to person, place, and time. He appears well-developed. No distress.  Morbidly obese.  HENT:  Head: Normocephalic and atraumatic.  Mouth/Throat: Oropharynx is clear and moist.  Eyes: Conjunctivae are normal.  Neck: Normal range of motion. Neck supple.  Cardiovascular: Normal rate, regular rhythm and normal heart sounds.   Pulmonary/Chest: Tachypnea noted. He has rhonchi (right mid-lower lung fields).  Wet cough present.  Abdominal: Soft. Bowel sounds are normal. He exhibits no distension. There is no tenderness.  There is no CVA tenderness.  Musculoskeletal: Normal range of motion. He exhibits no edema.  Neurological: He is alert and oriented to person, place, and time.  Skin: Skin is warm and dry. He is not diaphoretic.  Psychiatric: He has a normal mood and affect. His behavior is normal.    ED Course  Procedures (including critical care time)  Labs Reviewed  GLUCOSE, CAPILLARY - Abnormal; Notable for the following:    Glucose-Capillary 110 (*)    All other components within normal limits  URINALYSIS, ROUTINE W REFLEX MICROSCOPIC   Dg Chest 2 View  11/27/2012   *RADIOLOGY REPORT*  Clinical Data: Productive cough.  Weakness.  Fever.  CHEST - 2 VIEW  Comparison: 08/27/2008  Findings: Heart is enlarged.  There is minimal patchy density at the right lung base, raising the question of early infiltrate.  No evidence for pulmonary edema.  There are degenerative changes in the thoracic spine.  IMPRESSION:  1.  Cardiomegaly without pulmonary edema. 2.  Right lower lobe infiltrate suspected.   Original Report Authenticated By: Norva Pavlov, M.D.     1. Community acquired pneumonia       MDM  RLL infiltrate. Ronchi on lung exam in mid-lower lung field. Rx levaquin for pneumonia. Breathing treatment given in ED with great relief of his cough. He is sitting up in bed in NAD. Patient unable to urinate in the ED and states he wants to go home as he does not think he will have to go to the bathroom. Rx levaquin for pneumonia. Advised staying well hydrated, lots of rest. Inhaler given. If urinary symptoms continue, he will return. Vitals are stable. Close return precautions discussed. Resource guide given for PCP follow up. Patient states understanding of plan and is agreeable.      Trevor Mace, PA-C 11/27/12 1110

## 2012-11-30 IMAGING — CR DG TIBIA/FIBULA 2V*R*
3 series · 3 of 3 positions shown · non-contrast
Comparison: None

CLINICAL DATA: Fell.  Injured leg.

RIGHT TIBIA AND FIBULA - 2 VIEW

[view not recorded (1 of 3)]
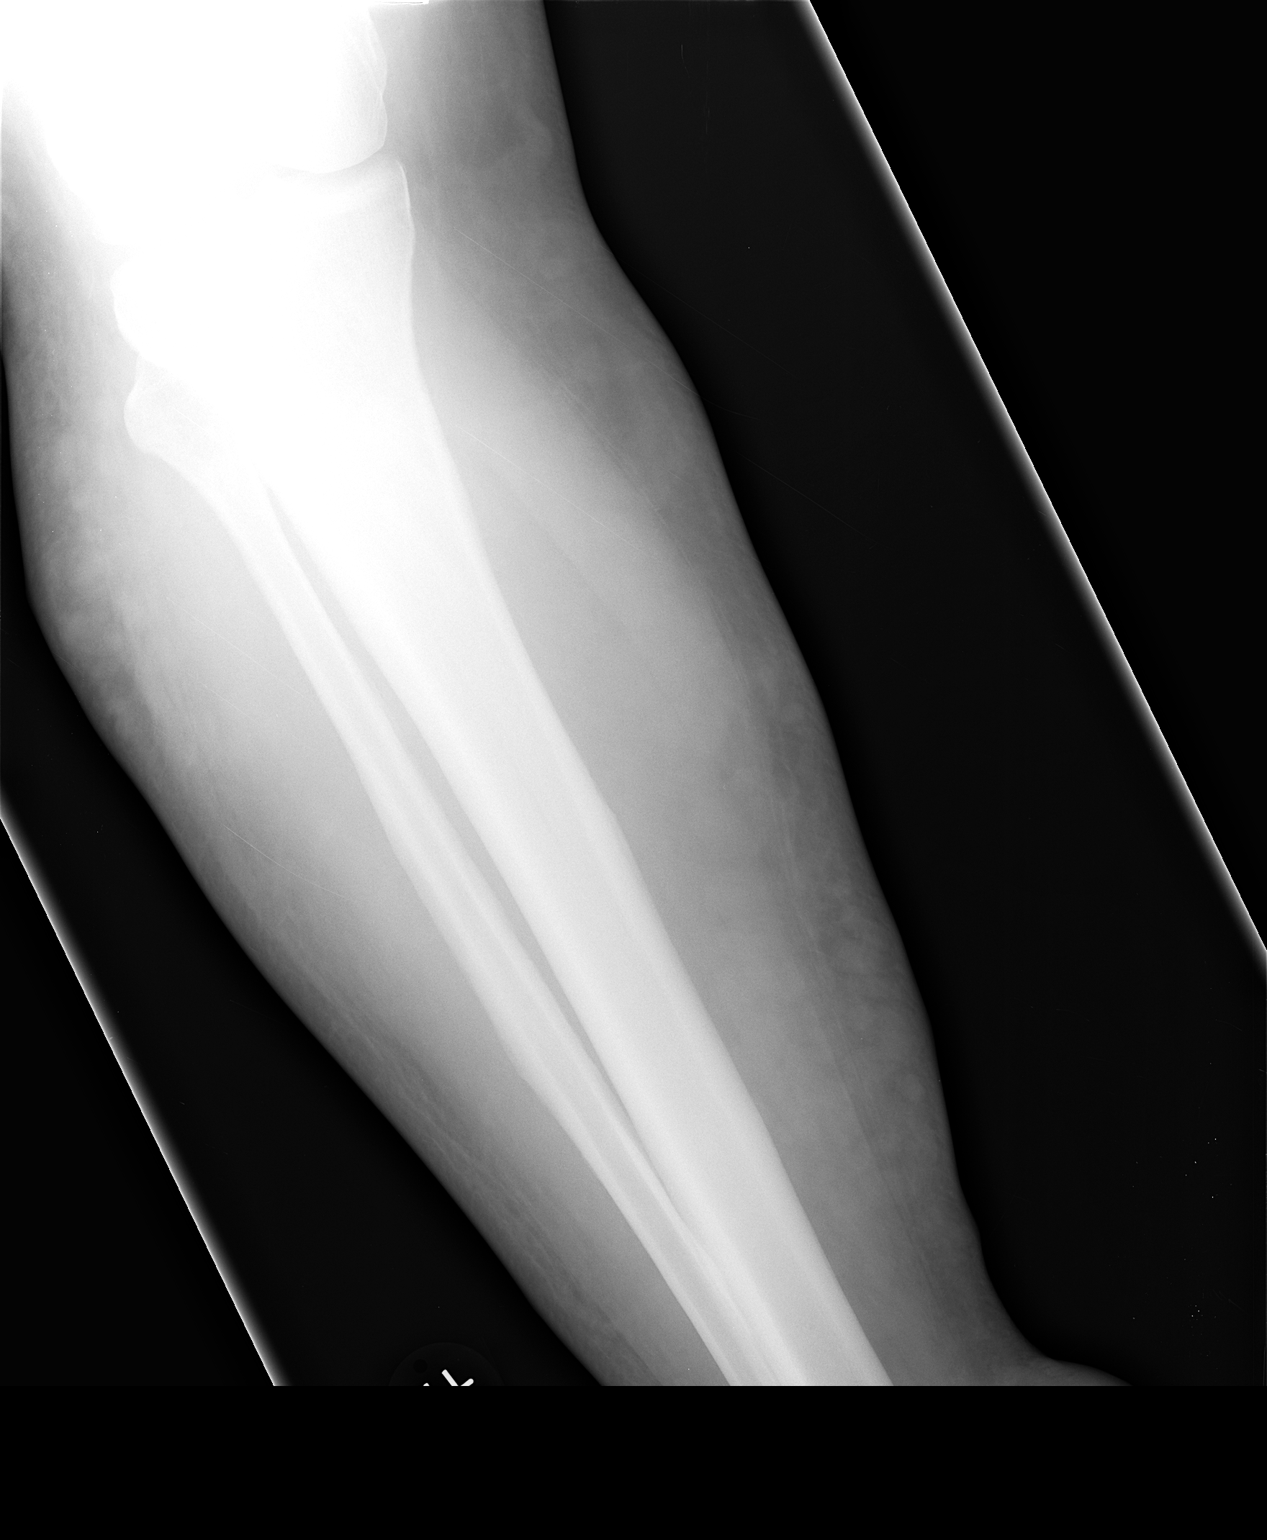

[view not recorded (2 of 3)]
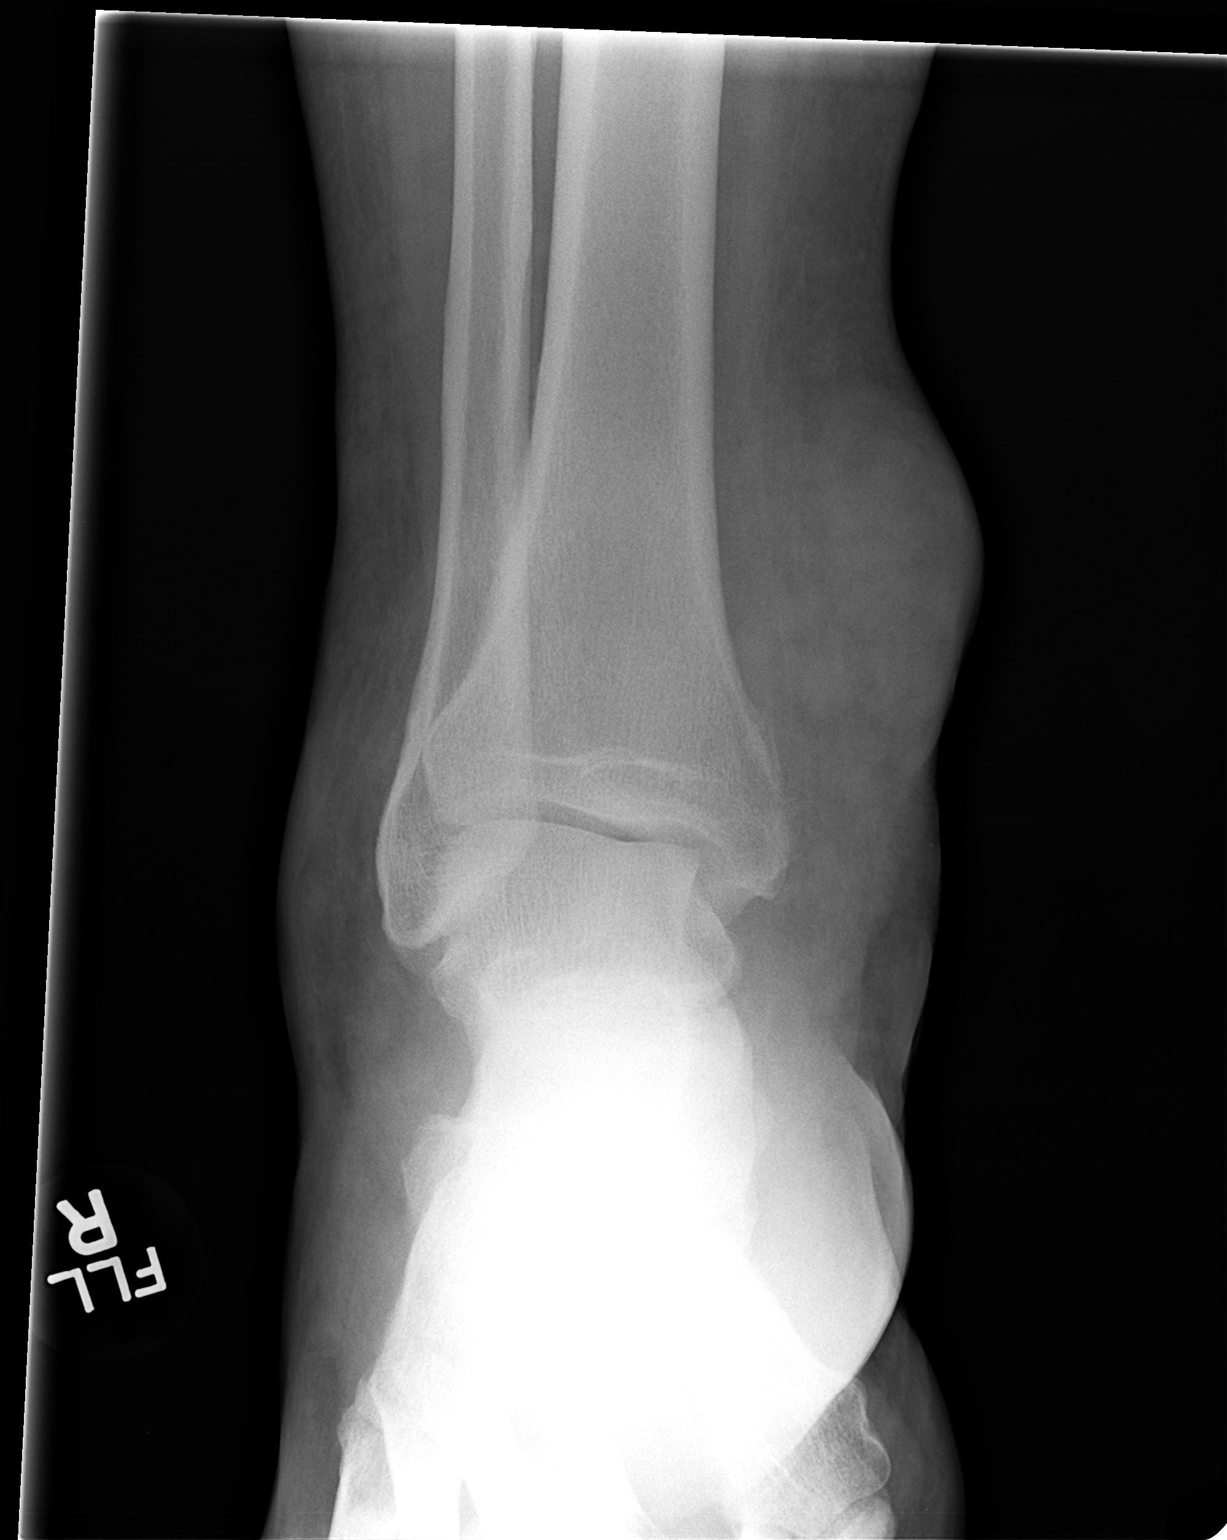

[view not recorded (3 of 3)]
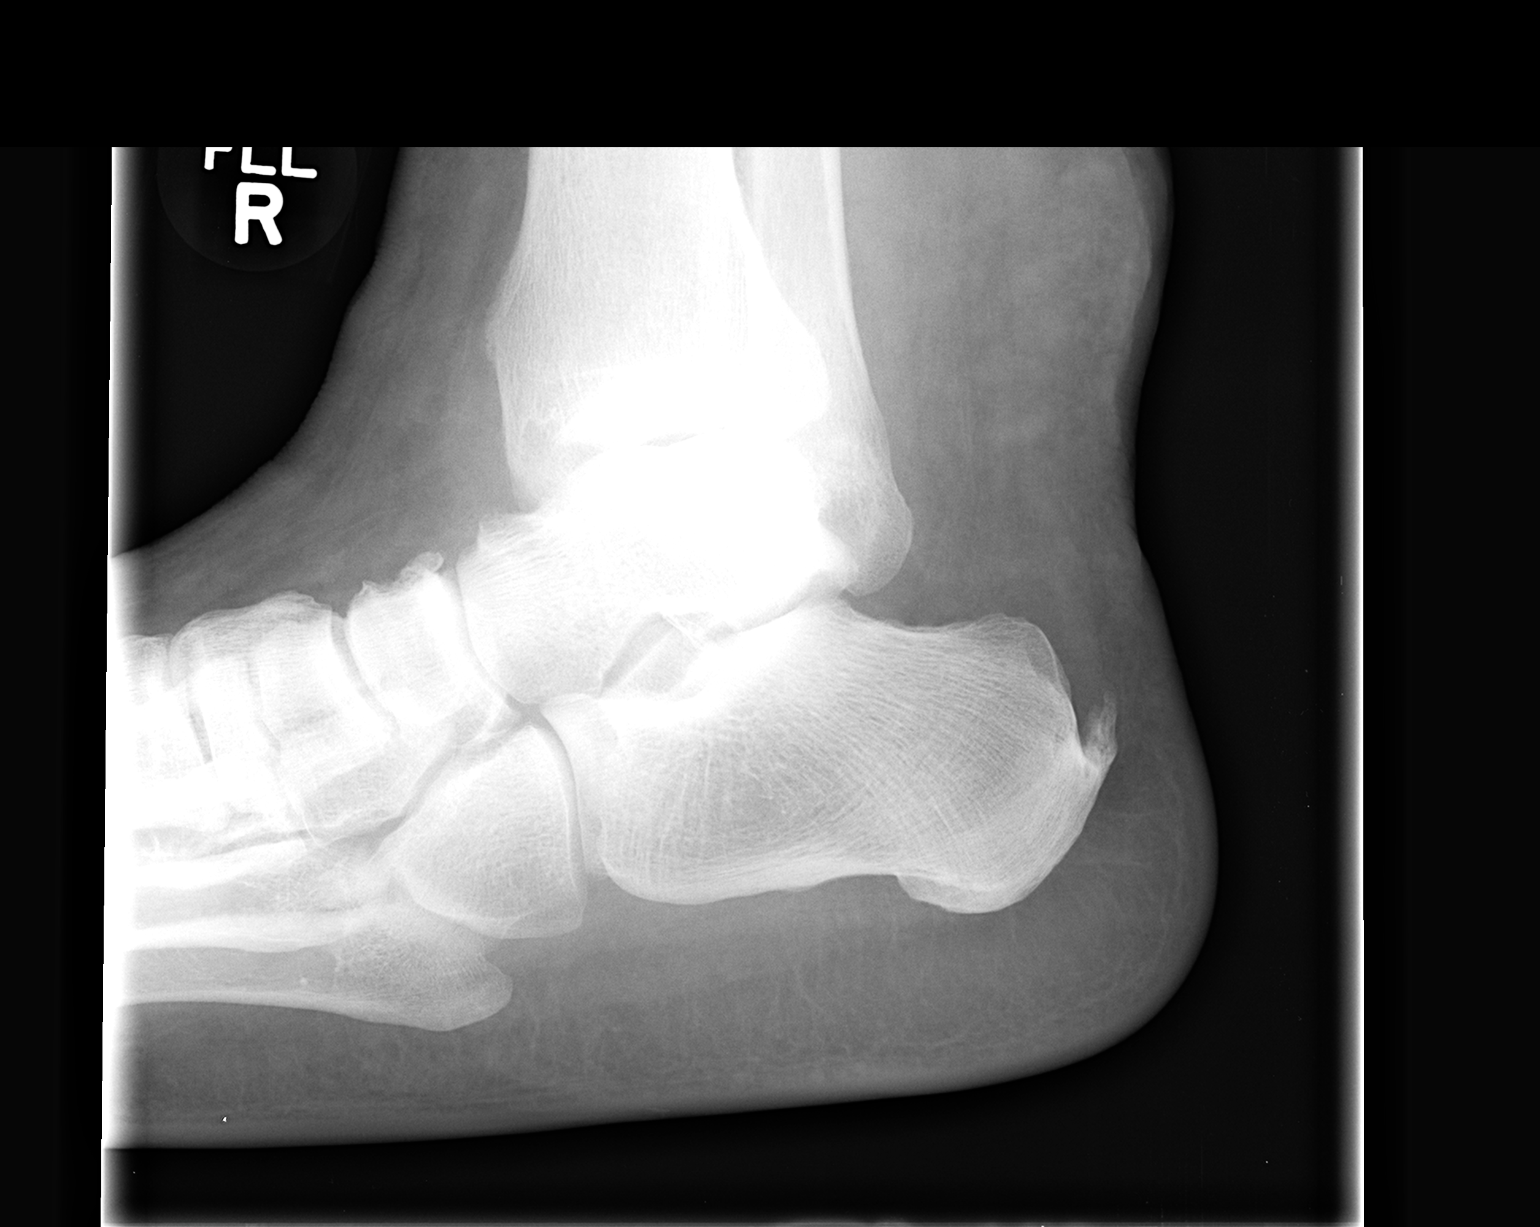

[3 of 3 positions shown; findings below may reference images not displayed]

FINDINGS: There is a large rounded soft tissue density along the
medial and posterior aspect of the lower leg which is likely a
hematoma.  The knee and ankle joints are maintained.  No acute
fracture.
IMPRESSION: No acute bony findings.

## 2012-12-01 NOTE — ED Provider Notes (Signed)
Medical screening examination/treatment/procedure(s) were performed by non-physician practitioner and as supervising physician I was immediately available for consultation/collaboration.  Shelda Jakes, MD 12/01/12 1204

## 2012-12-08 IMAGING — CR DG SACRUM/COCCYX 2+V
4 series · 4 of 4 positions shown · non-contrast
Comparison: None.

CLINICAL DATA: Pain after fall down steps.

SACRUM AND COCCYX - 2+ VIEW

[view not recorded (1 of 4)]
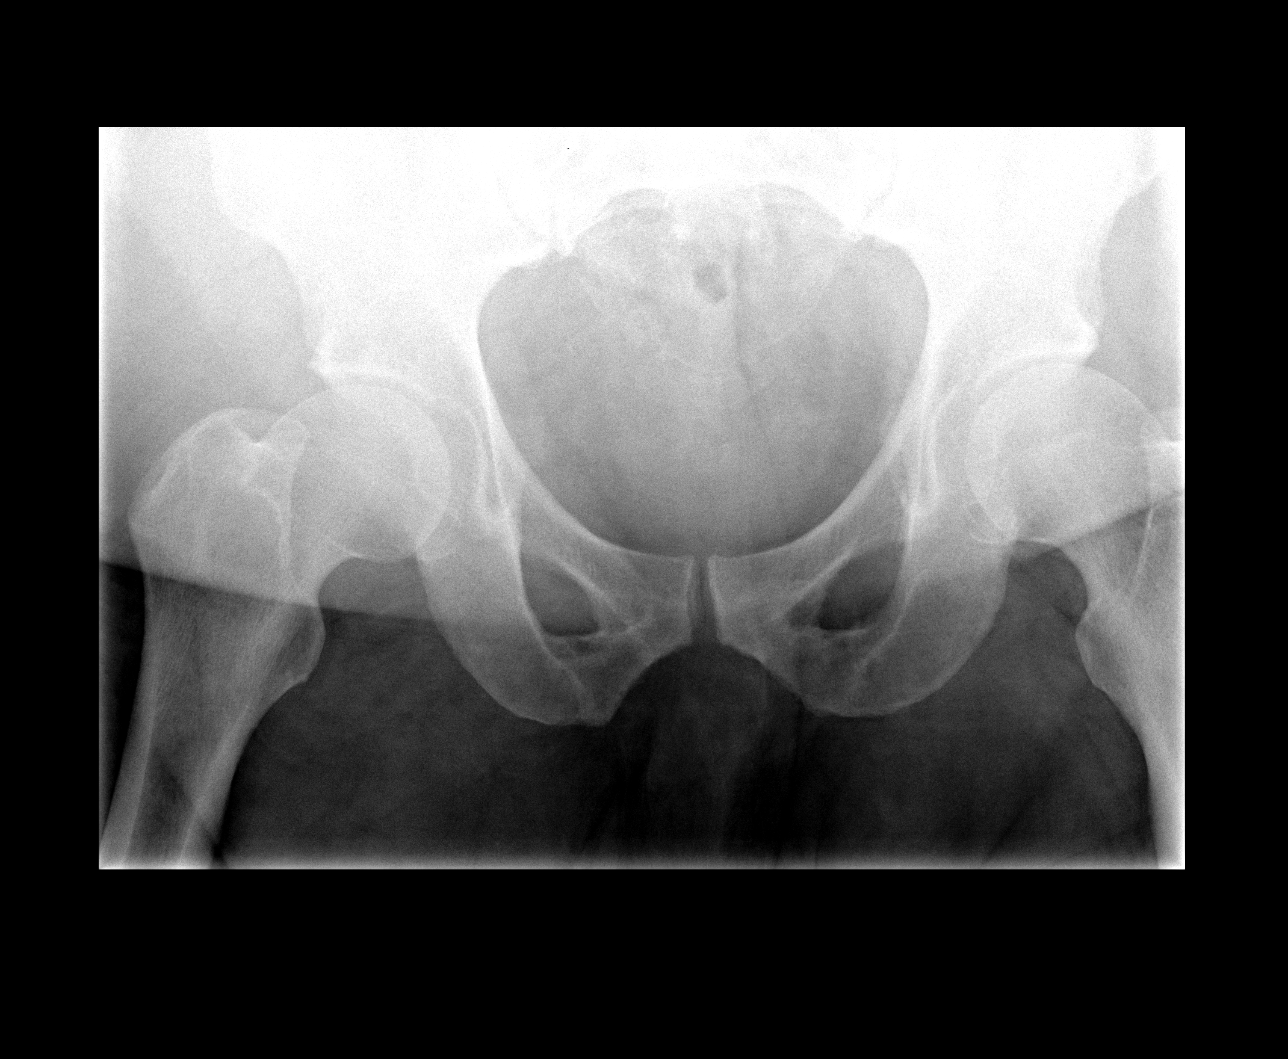

[view not recorded (2 of 4)]
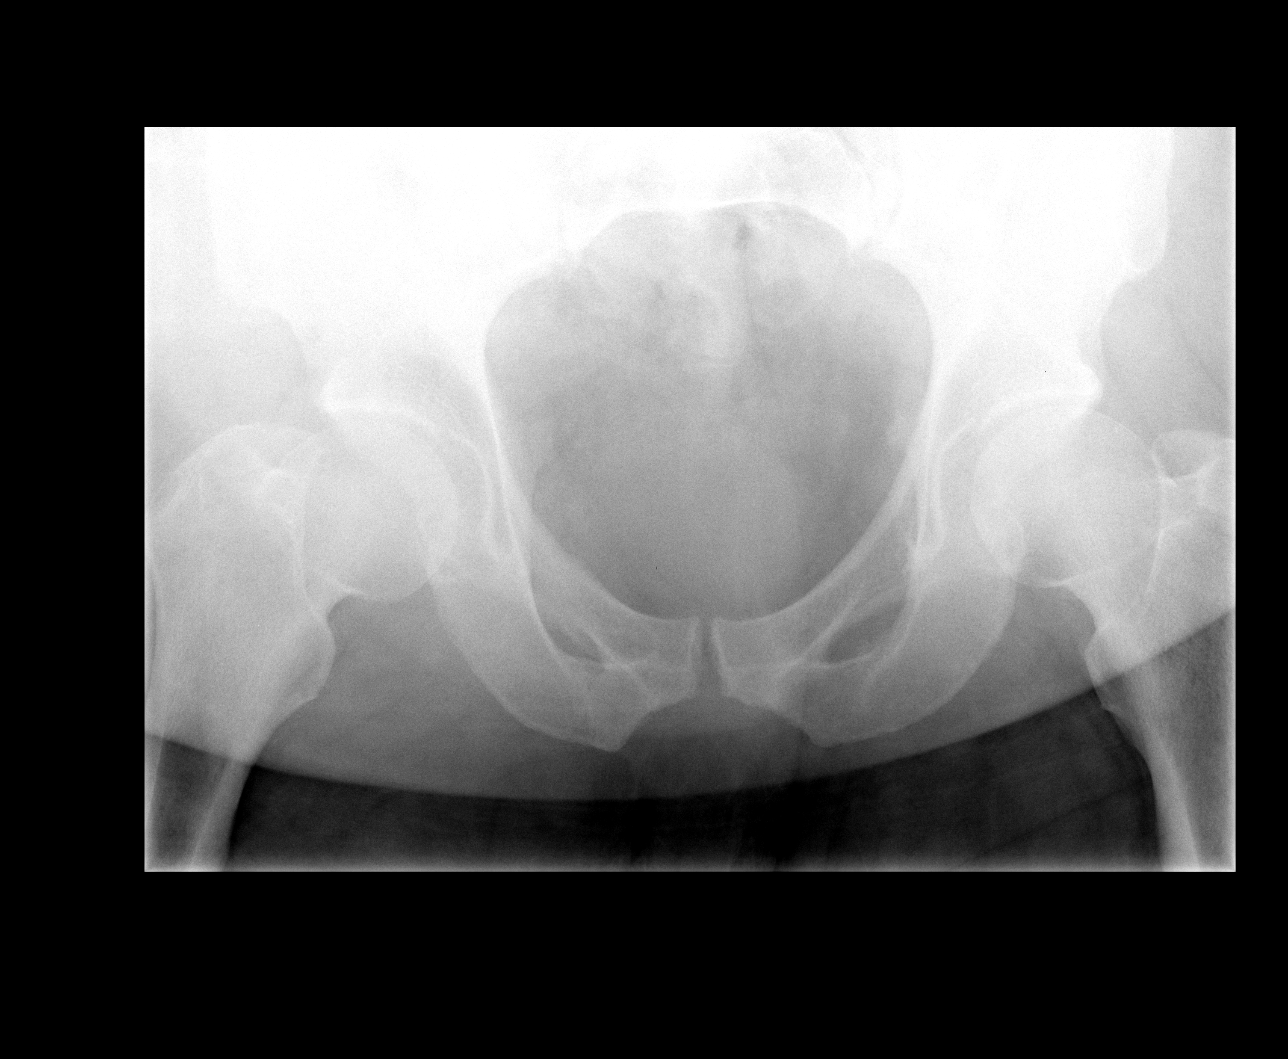

[view not recorded (3 of 4)]
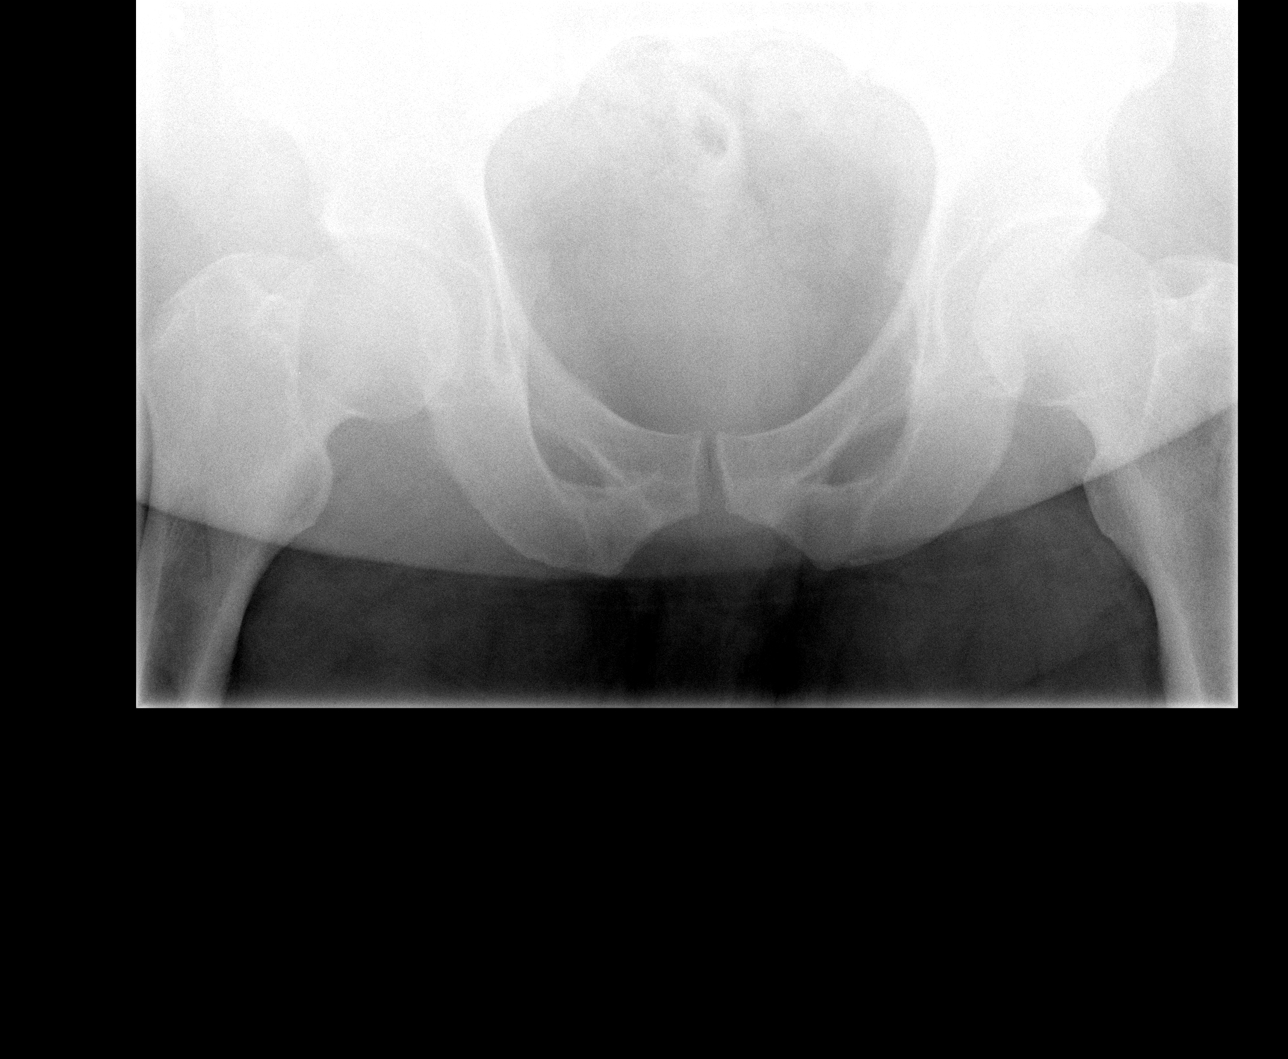

[view not recorded (4 of 4)]
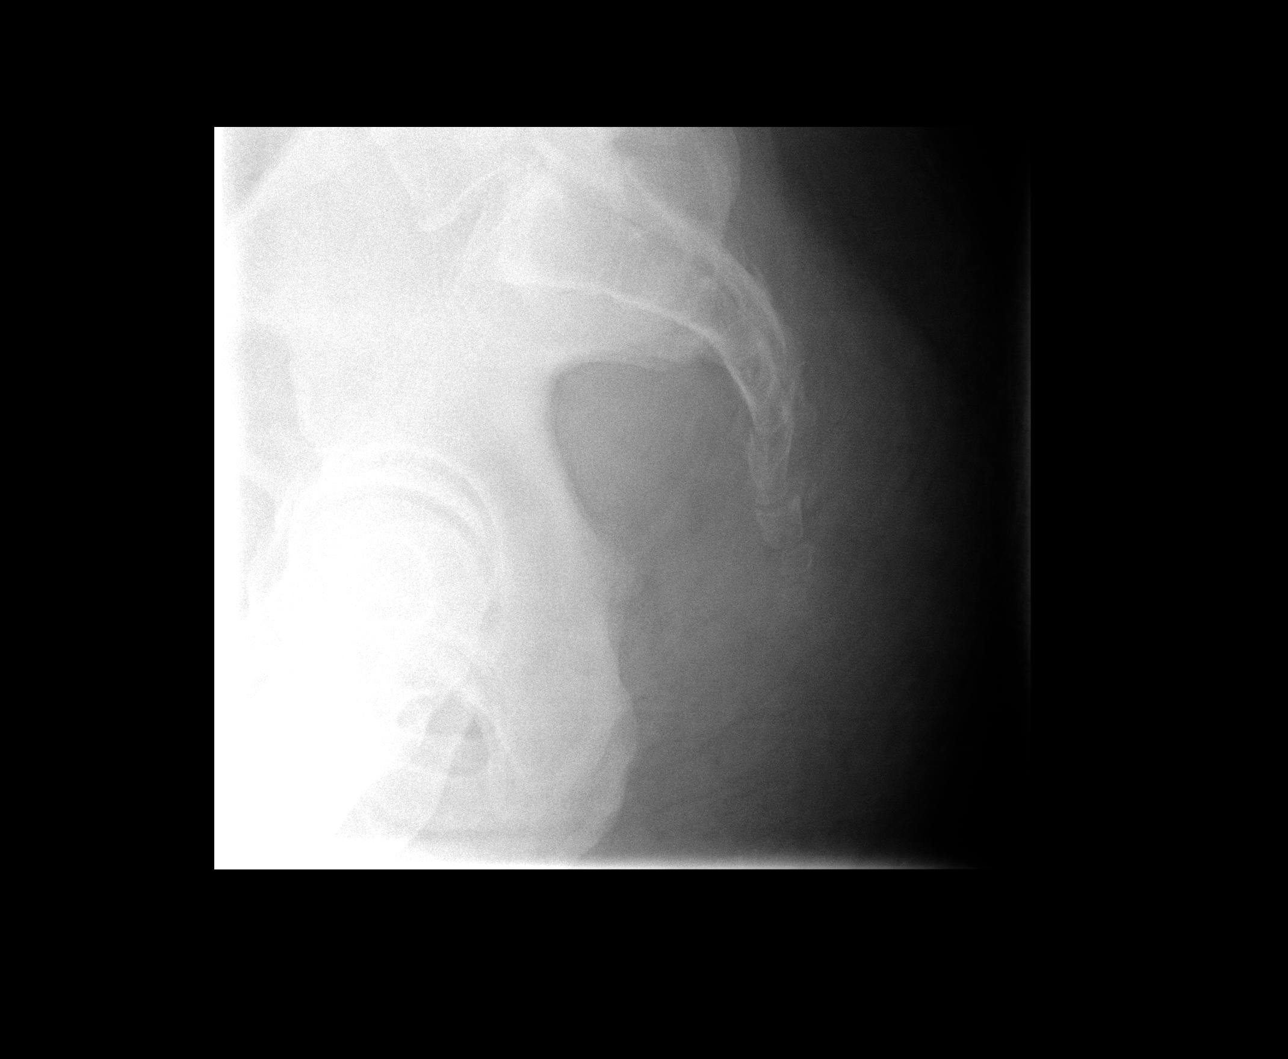

[4 of 4 positions shown; findings below may reference images not displayed]

FINDINGS: The pelvis appears intact.  SI joints and symphysis pubis
are not displaced.  Focal irregularity and cortical depression of
the junction of the sacral coccygeal spine suggesting mildly
depressed fracture.
IMPRESSION: Mildly depressed fracture of the junction of the sacral coccygeal
spine.

## 2012-12-08 IMAGING — CR DG ABDOMEN ACUTE W/ 1V CHEST
4 series · 4 of 4 positions shown · non-contrast
Comparison: 09/17/2008

CLINICAL DATA: The patient complains of abdominal pain and tail
bone pain after falling down steps [REDACTED] or [REDACTED].

ACUTE ABDOMEN SERIES (ABDOMEN 2 VIEW & CHEST 1 VIEW)

[view not recorded (1 of 4)]
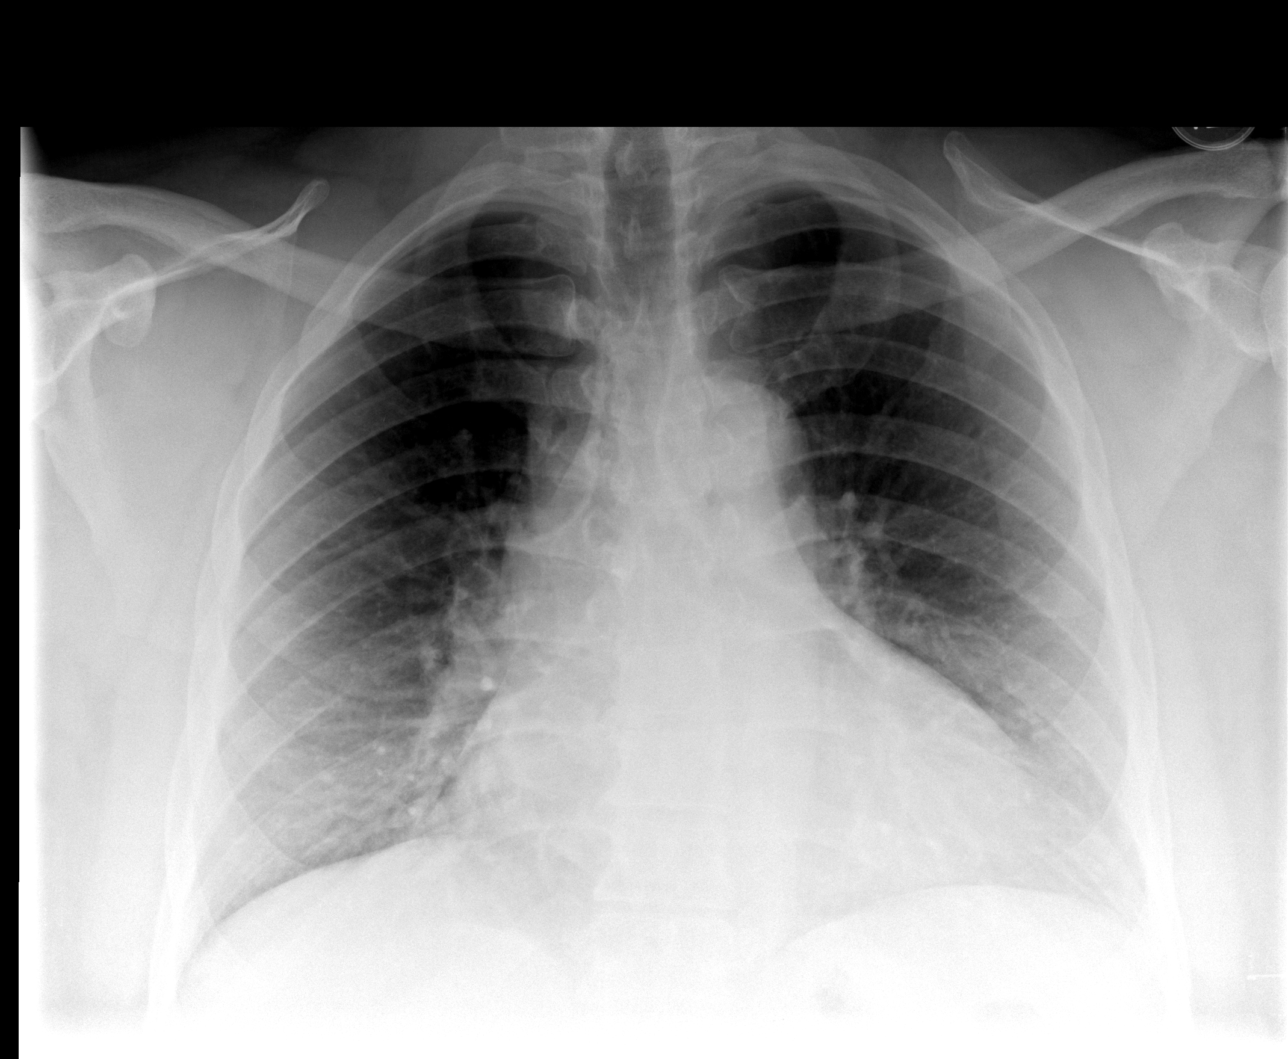

[view not recorded (2 of 4)]
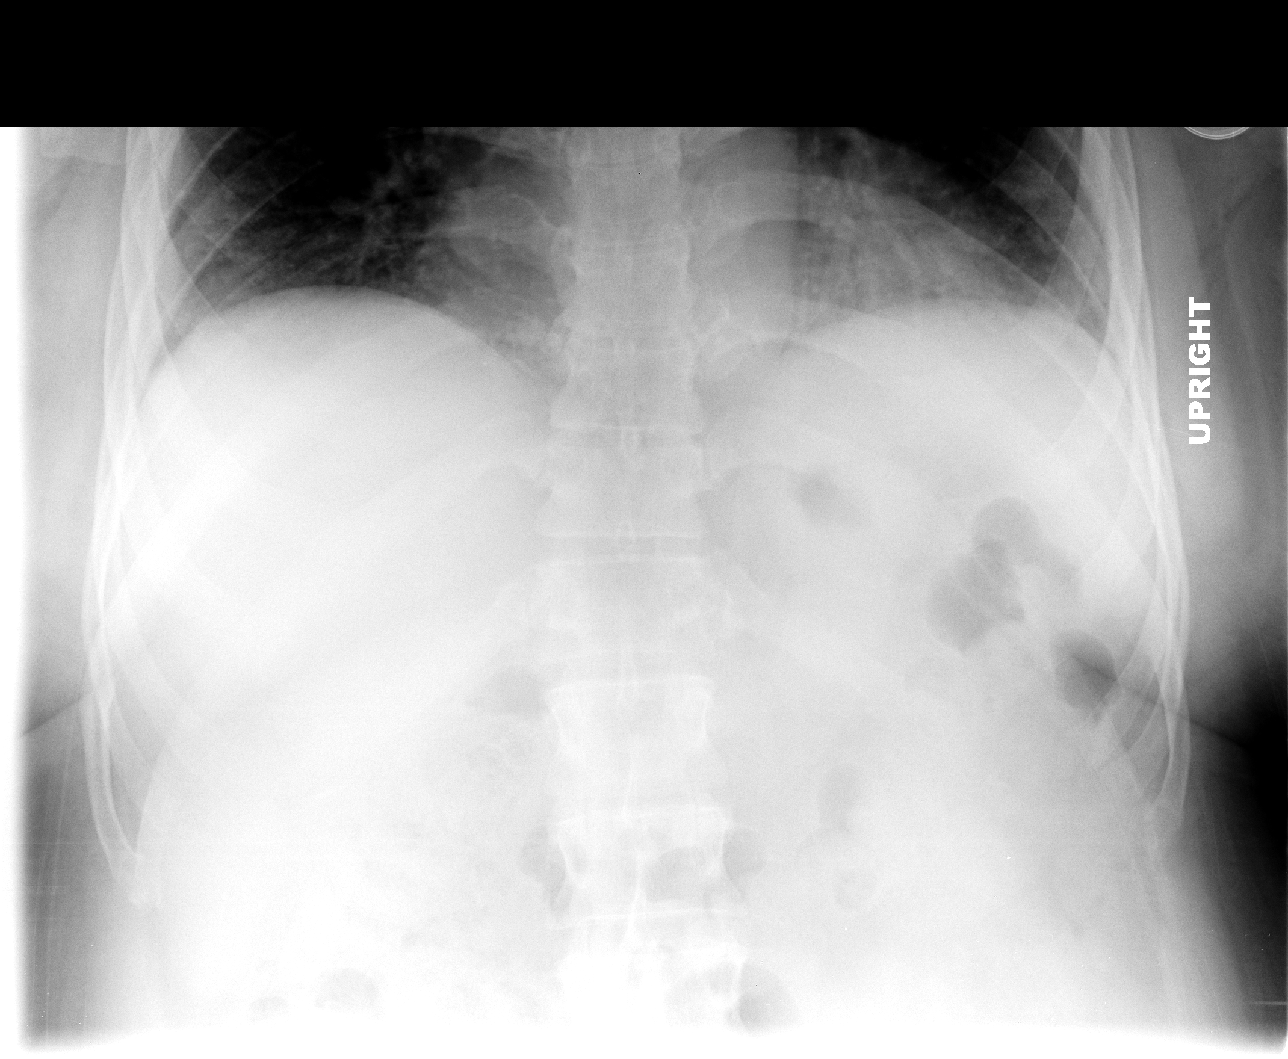

[view not recorded (3 of 4)]
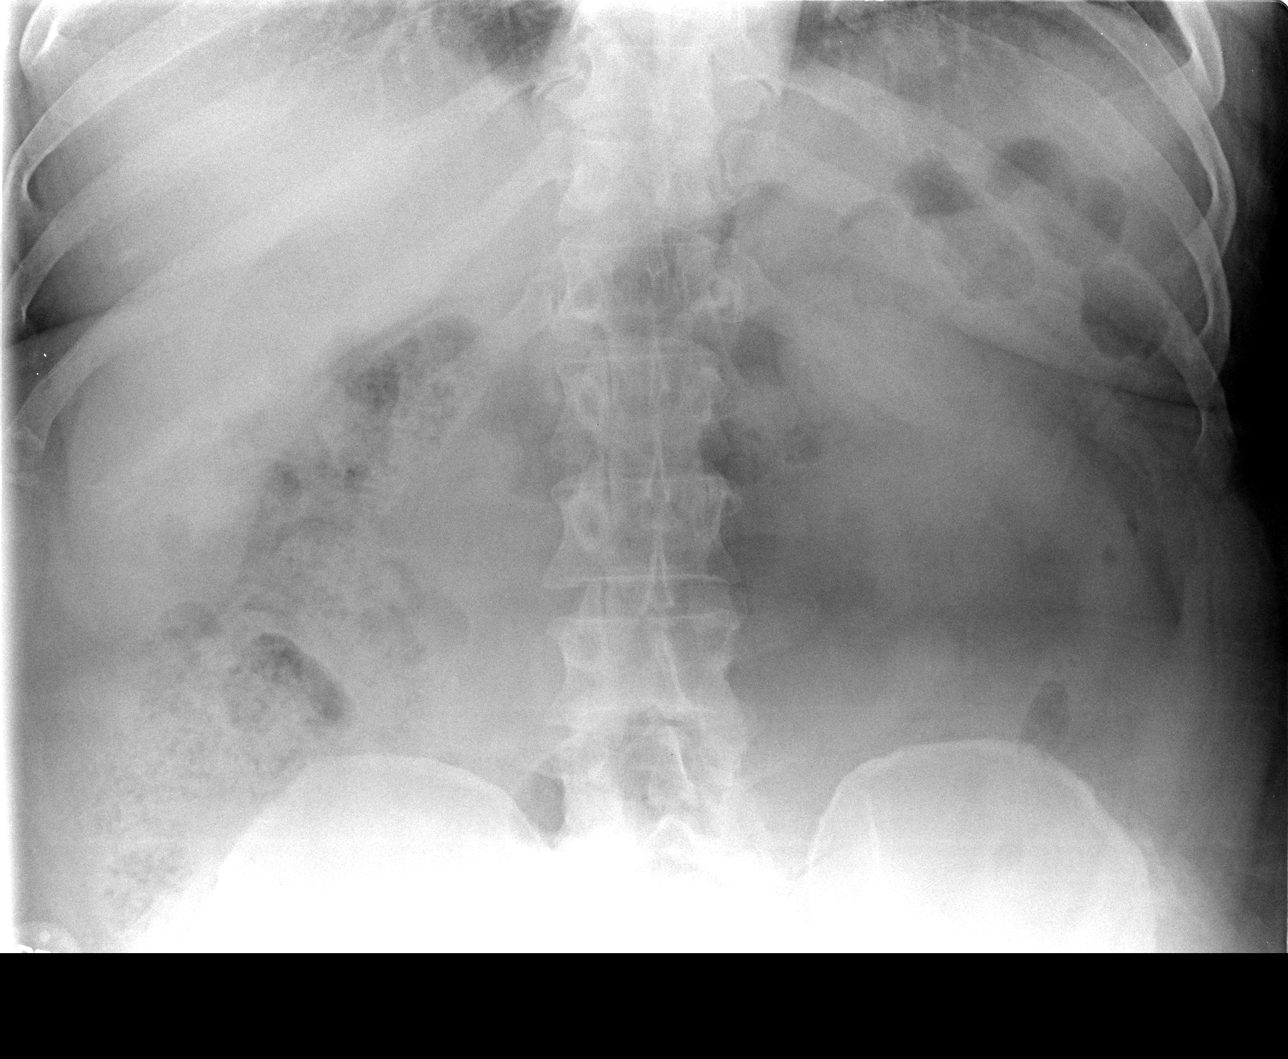

[view not recorded (4 of 4)]
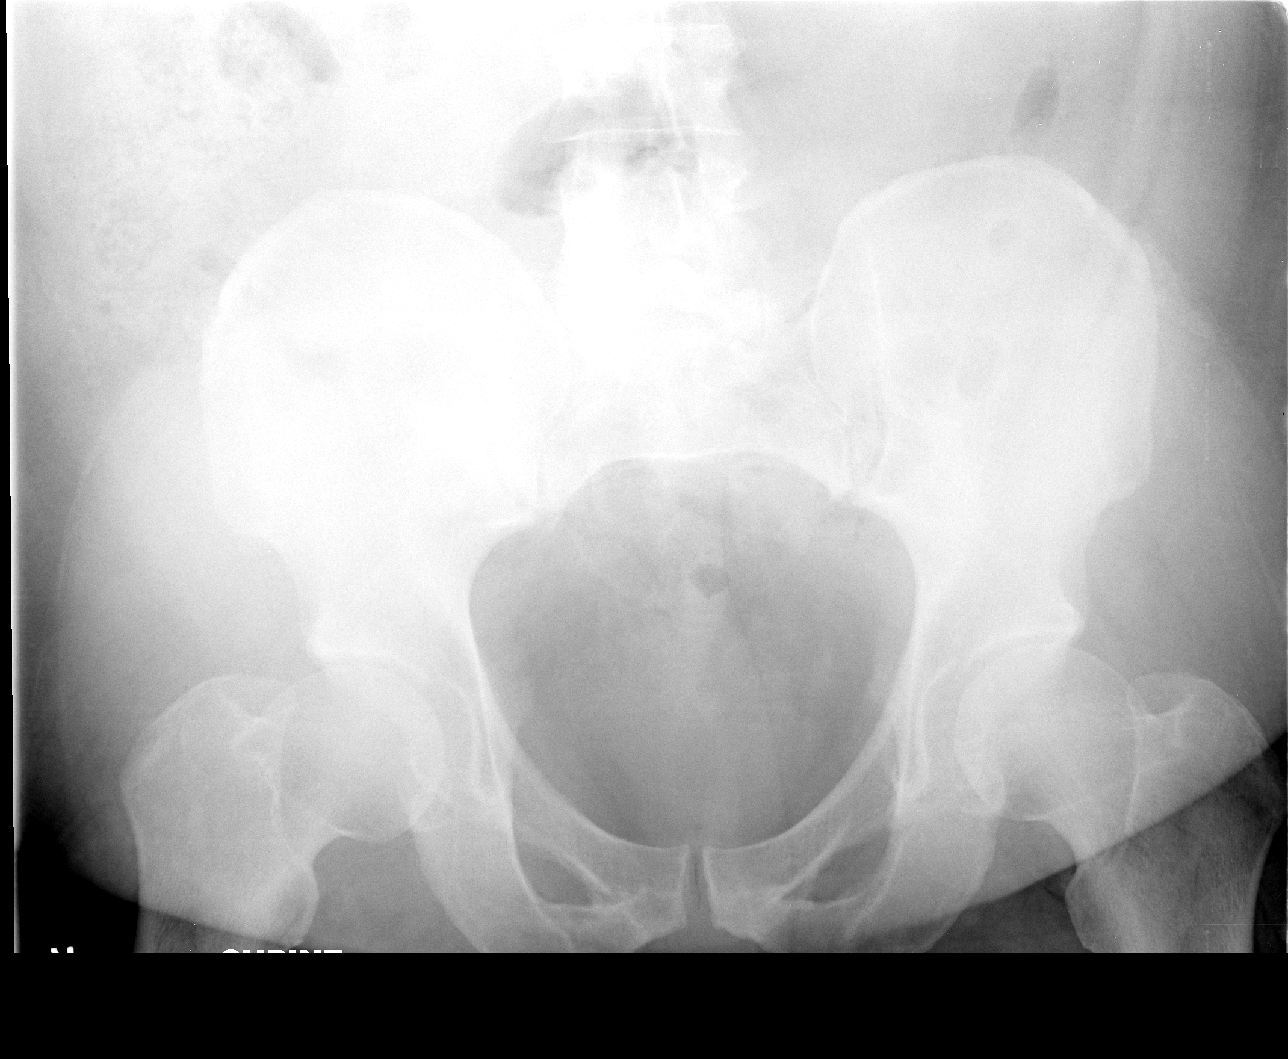

[4 of 4 positions shown; findings below may reference images not displayed]

FINDINGS: Shallow inspiration.  Mild cardiac enlargement with
normal pulmonary vascularity.  No focal airspace consolidation in
the lungs.  No pneumothorax.  No pleural effusions.

Scattered gas and stool in the colon.  No small or large bowel
distension.  No free intra-abdominal air.  No abnormal air fluid
levels.  No radiopaque stones.  Visualized bones appear intact.
IMPRESSION: Mild cardiac enlargement.  No evidence of active pulmonary disease.
Nonobstructive bowel gas pattern.

## 2013-01-12 IMAGING — US US EXTREM LOW VENOUS BILAT
1 series · 14 of 24 positions shown · non-contrast
Comparison: None.

CLINICAL DATA: Lower leg swelling.

BILATERAL LOWER EXTREMITY VENOUS DUPLEX ULTRASOUND
TECHNIQUE: Gray-scale sonography with graded compression, as well
as color Doppler and duplex ultrasound, were performed to evaluate
the deep venous system of both lower extremities from the level of
the common femoral vein through the popliteal and proximal calf
veins.  Spectral Doppler was utilized to evaluate flow at rest and
with distal augmentation maneuvers.

[Series 1: us extrem low venous bilat · 14 of 54 slices shown]
[im 1/54]
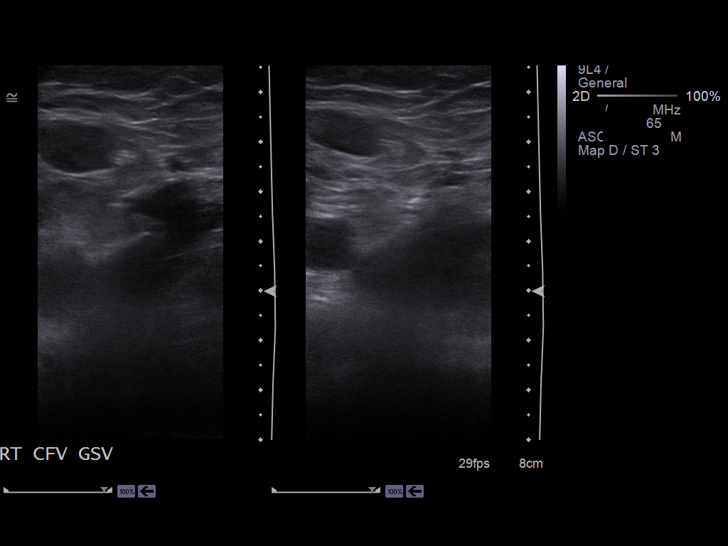
[im 5/54]
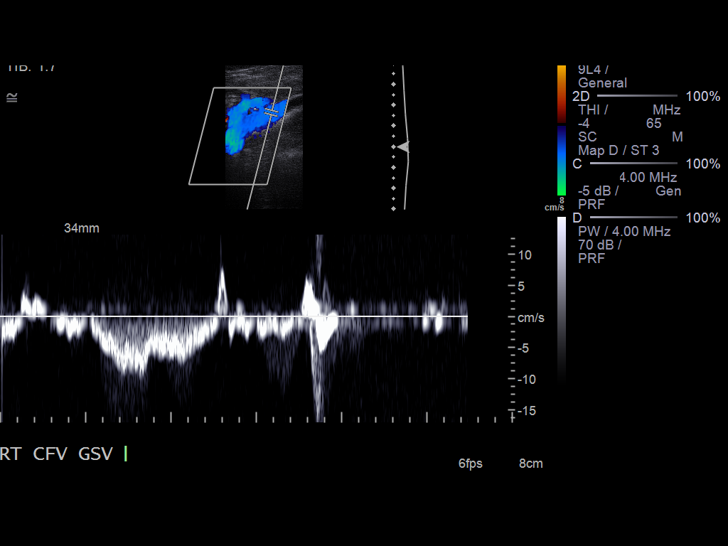
[im 10/54]
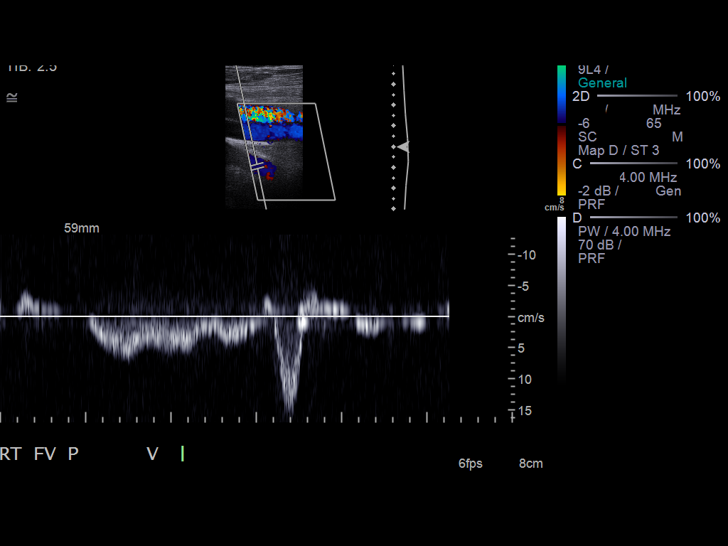
[im 14/54]
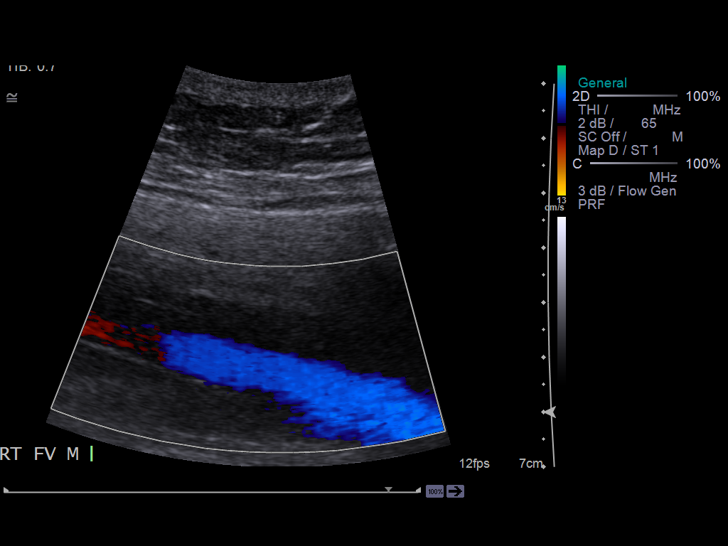
[im 17/54]
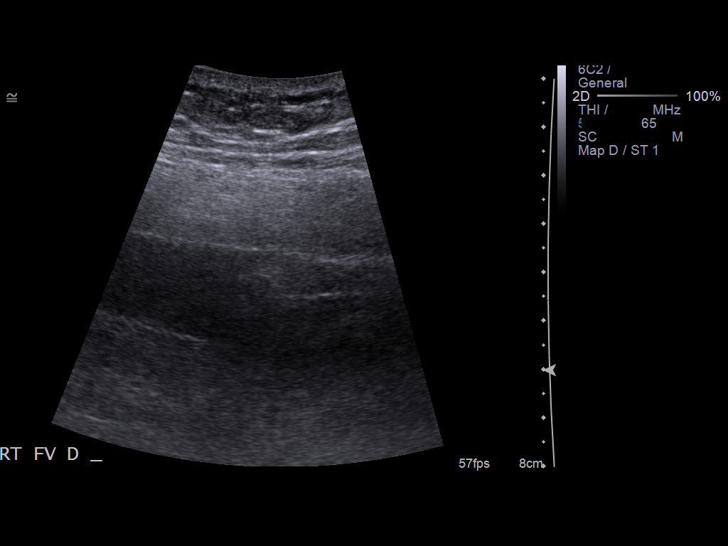
[im 21/54]
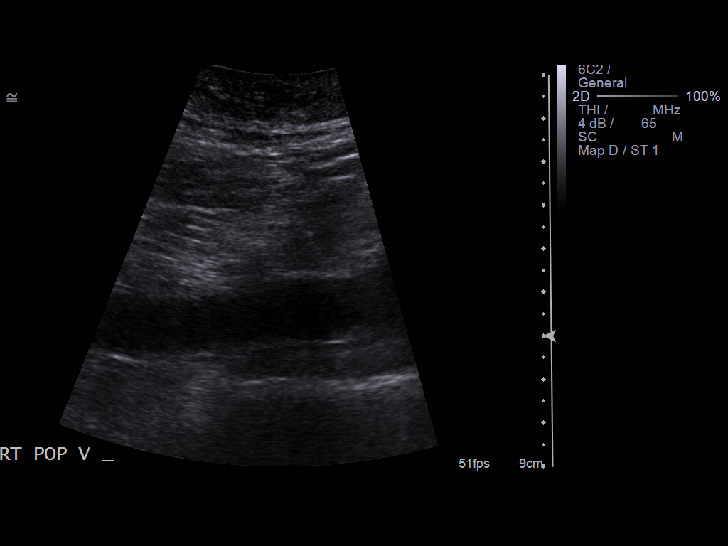
[im 26/54]
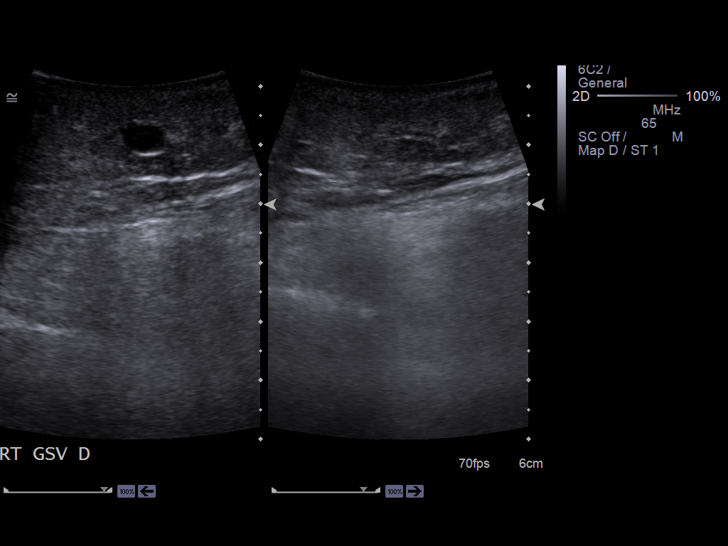
[im 28/54]
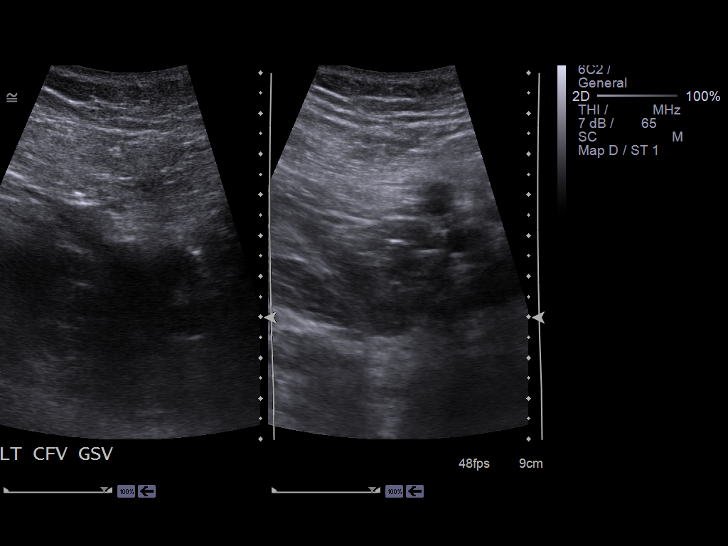
[im 33/54]
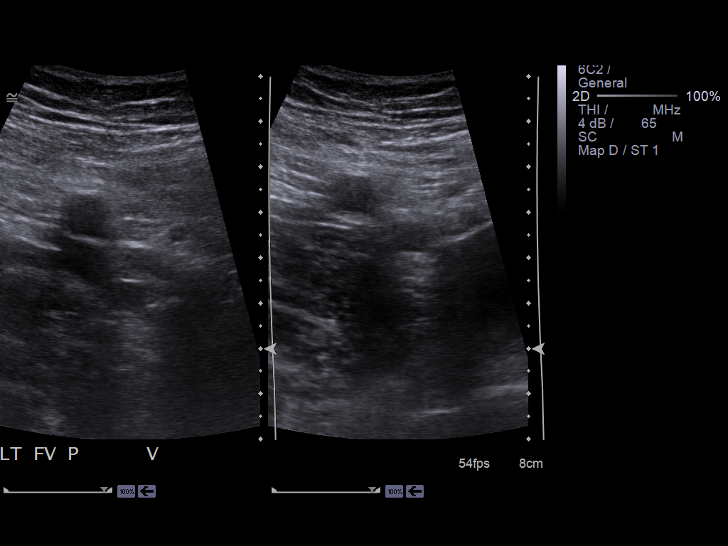
[im 37/54]
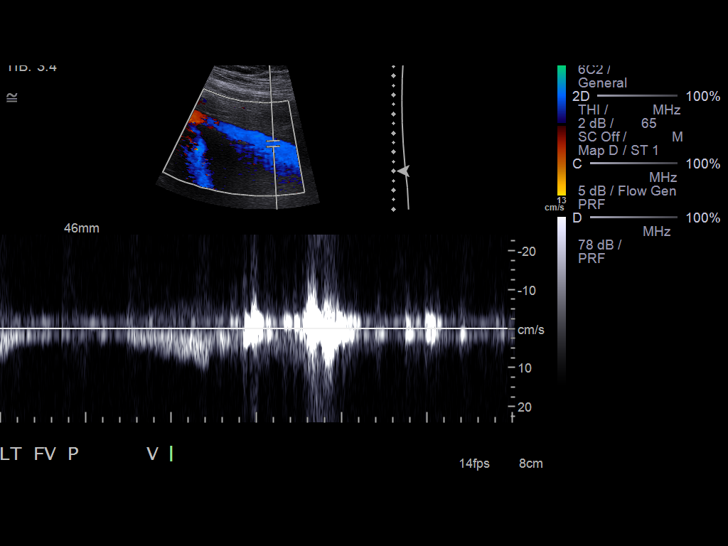
[im 42/54]
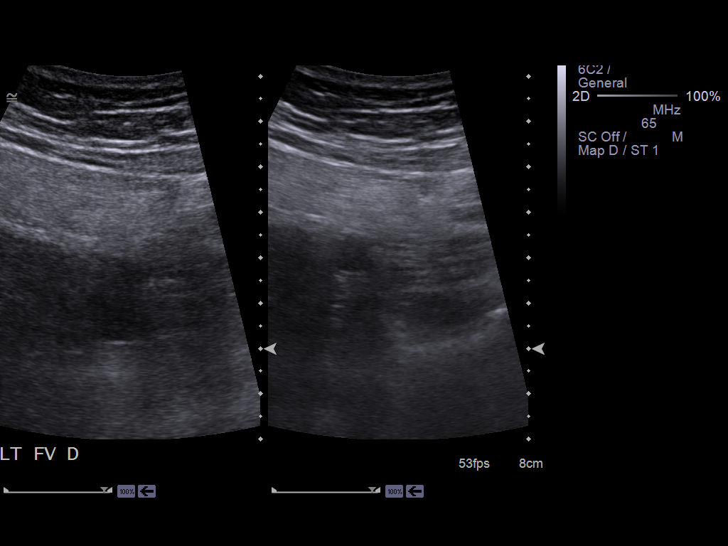
[im 44/54]
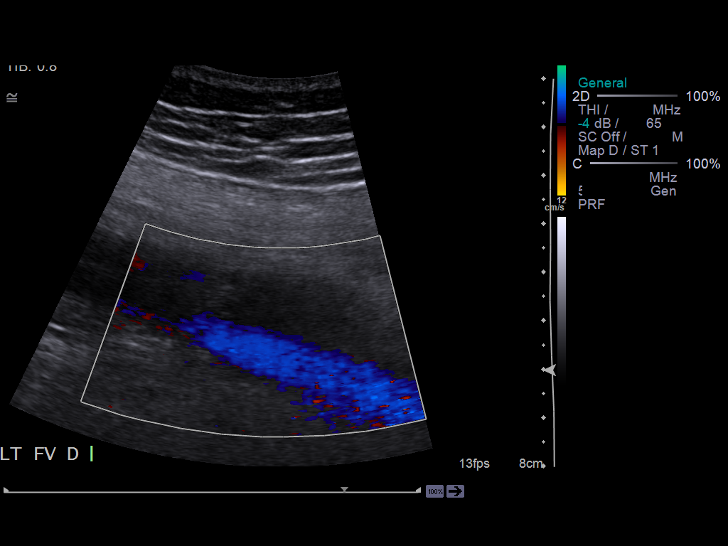
[im 49/54]
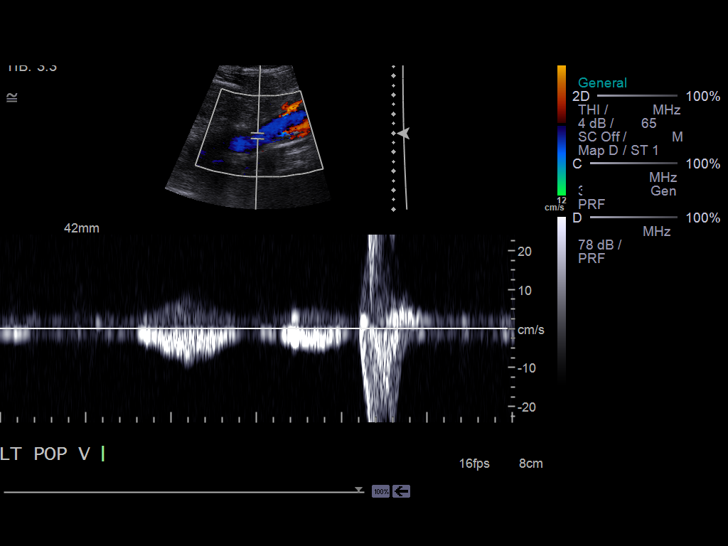
[im 54/54]
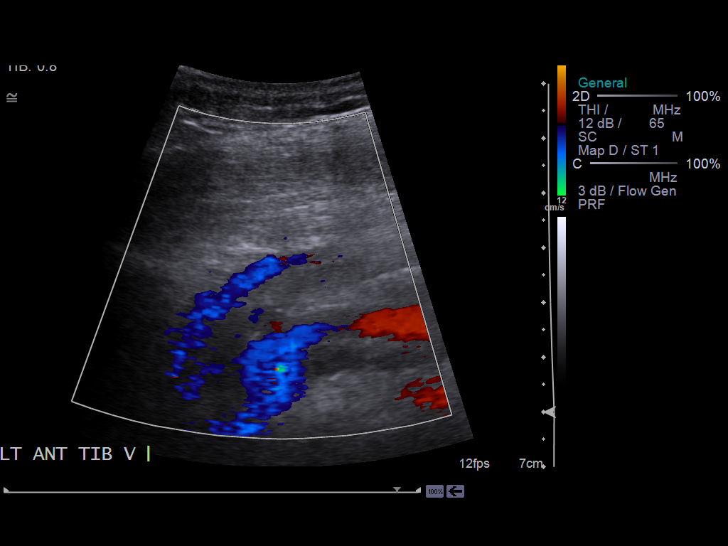

[14 of 24 positions shown; findings below may reference images not displayed]

FINDINGS: Normal compressibility of bilateral common femoral,
superficial femoral, and popliteal veins is demonstrated, as well
as the visualized proximal calf veins.  No filling defects to
suggest DVT on grayscale or color Doppler imaging.  Doppler
waveforms show normal direction of venous flow, normal respiratory
phasicity and response to augmentation.
IMPRESSION: No evidence of bilateral lower extremity deep vein thrombosis.

## 2014-04-02 ENCOUNTER — Inpatient Hospital Stay (HOSPITAL_COMMUNITY)
Admission: EM | Admit: 2014-04-02 | Discharge: 2014-04-04 | DRG: 292 | Disposition: A | Discharge status: Home / Self Care | Payer: Medicare Other | Attending: Family Medicine | Admitting: Family Medicine

## 2014-04-02 ENCOUNTER — Emergency Department (HOSPITAL_COMMUNITY): Payer: Medicare Other

## 2014-04-02 ENCOUNTER — Encounter (HOSPITAL_COMMUNITY): Payer: Self-pay | Admitting: Emergency Medicine

## 2014-04-02 DIAGNOSIS — J45909 Unspecified asthma, uncomplicated: Secondary | ICD-10-CM | POA: Diagnosis present

## 2014-04-02 DIAGNOSIS — Z9114 Patient's other noncompliance with medication regimen: Secondary | ICD-10-CM | POA: Diagnosis present

## 2014-04-02 DIAGNOSIS — E785 Hyperlipidemia, unspecified: Secondary | ICD-10-CM | POA: Diagnosis present

## 2014-04-02 DIAGNOSIS — J189 Pneumonia, unspecified organism: Secondary | ICD-10-CM

## 2014-04-02 DIAGNOSIS — Z6841 Body Mass Index (BMI) 40.0 and over, adult: Secondary | ICD-10-CM | POA: Diagnosis not present

## 2014-04-02 DIAGNOSIS — I4891 Unspecified atrial fibrillation: Secondary | ICD-10-CM

## 2014-04-02 DIAGNOSIS — G4730 Sleep apnea, unspecified: Secondary | ICD-10-CM | POA: Diagnosis present

## 2014-04-02 DIAGNOSIS — I509 Heart failure, unspecified: Secondary | ICD-10-CM

## 2014-04-02 DIAGNOSIS — D72819 Decreased white blood cell count, unspecified: Secondary | ICD-10-CM | POA: Diagnosis present

## 2014-04-02 DIAGNOSIS — K219 Gastro-esophageal reflux disease without esophagitis: Secondary | ICD-10-CM | POA: Diagnosis present

## 2014-04-02 DIAGNOSIS — I5033 Acute on chronic diastolic (congestive) heart failure: Secondary | ICD-10-CM

## 2014-04-02 DIAGNOSIS — M1990 Unspecified osteoarthritis, unspecified site: Secondary | ICD-10-CM | POA: Diagnosis present

## 2014-04-02 DIAGNOSIS — F411 Generalized anxiety disorder: Secondary | ICD-10-CM | POA: Diagnosis present

## 2014-04-02 DIAGNOSIS — I472 Ventricular tachycardia: Secondary | ICD-10-CM

## 2014-04-02 DIAGNOSIS — J069 Acute upper respiratory infection, unspecified: Secondary | ICD-10-CM | POA: Diagnosis present

## 2014-04-02 DIAGNOSIS — R0602 Shortness of breath: Secondary | ICD-10-CM | POA: Diagnosis present

## 2014-04-02 DIAGNOSIS — I10 Essential (primary) hypertension: Secondary | ICD-10-CM | POA: Diagnosis present

## 2014-04-02 DIAGNOSIS — Z91148 Patient's other noncompliance with medication regimen for other reason: Secondary | ICD-10-CM

## 2014-04-02 DIAGNOSIS — Z87891 Personal history of nicotine dependence: Secondary | ICD-10-CM

## 2014-04-02 DIAGNOSIS — G4733 Obstructive sleep apnea (adult) (pediatric): Secondary | ICD-10-CM | POA: Diagnosis present

## 2014-04-02 DIAGNOSIS — I4729 Other ventricular tachycardia: Secondary | ICD-10-CM

## 2014-04-02 DIAGNOSIS — I110 Hypertensive heart disease with heart failure: Principal | ICD-10-CM

## 2014-04-02 DIAGNOSIS — R509 Fever, unspecified: Secondary | ICD-10-CM

## 2014-04-02 LAB — BASIC METABOLIC PANEL
ANION GAP: 12 (ref 5–15)
BUN: 15 mg/dL (ref 6–23)
CHLORIDE: 103 meq/L (ref 96–112)
CO2: 25 mEq/L (ref 19–32)
Calcium: 8.4 mg/dL (ref 8.4–10.5)
Creatinine, Ser: 1.05 mg/dL (ref 0.50–1.35)
GFR, EST NON AFRICAN AMERICAN: 84 mL/min — AB (ref >90)
Glucose, Bld: 99 mg/dL (ref 70–99)
POTASSIUM: 3.5 meq/L — AB (ref 3.7–5.3)
Sodium: 140 mEq/L (ref 137–147)

## 2014-04-02 LAB — CBC WITH DIFFERENTIAL/PLATELET
BASOS ABS: 0 10*3/uL (ref 0.0–0.1)
BASOS PCT: 0 % (ref 0–1)
EOS ABS: 0.1 10*3/uL (ref 0.0–0.7)
Eosinophils Relative: 1 % (ref 0–5)
HCT: 42.5 % (ref 39.0–52.0)
Hemoglobin: 14.6 g/dL (ref 13.0–17.0)
Lymphocytes Relative: 19 % (ref 12–46)
Lymphs Abs: 0.9 10*3/uL (ref 0.7–4.0)
MCH: 28.9 pg (ref 26.0–34.0)
MCHC: 34.4 g/dL (ref 30.0–36.0)
MCV: 84.2 fL (ref 78.0–100.0)
Monocytes Absolute: 0.3 10*3/uL (ref 0.1–1.0)
Monocytes Relative: 8 % (ref 3–12)
NEUTROS ABS: 3.2 10*3/uL (ref 1.7–7.7)
NEUTROS PCT: 72 % (ref 43–77)
Platelets: 174 10*3/uL (ref 150–400)
RBC: 5.05 MIL/uL (ref 4.22–5.81)
RDW: 13 % (ref 11.5–15.5)
WBC: 4.5 10*3/uL (ref 4.0–10.5)

## 2014-04-02 LAB — TROPONIN I: Troponin I: <0.3 ng/mL (ref <0.30)

## 2014-04-02 LAB — URINALYSIS, ROUTINE W REFLEX MICROSCOPIC
BILIRUBIN URINE: NEGATIVE
GLUCOSE, UA: NEGATIVE mg/dL
Hgb urine dipstick: NEGATIVE
Ketones, ur: NEGATIVE mg/dL
LEUKOCYTES UA: NEGATIVE
NITRITE: NEGATIVE
PH: 6 (ref 5.0–8.0)
PROTEIN: NEGATIVE mg/dL
Specific Gravity, Urine: 1.01 (ref 1.005–1.030)
Urobilinogen, UA: 1 mg/dL (ref 0.0–1.0)

## 2014-04-02 LAB — LIPID PANEL
CHOLESTEROL: 154 mg/dL (ref 0–200)
HDL: 44 mg/dL (ref >39)
LDL Cholesterol: 98 mg/dL (ref 0–99)
Total CHOL/HDL Ratio: 3.5 RATIO
Triglycerides: 61 mg/dL (ref <150)
VLDL: 12 mg/dL (ref 0–40)

## 2014-04-02 LAB — PRO B NATRIURETIC PEPTIDE: Pro B Natriuretic peptide (BNP): 746.5 pg/mL — ABNORMAL HIGH (ref 0–125)

## 2014-04-02 MED ORDER — METOPROLOL TARTRATE 25 MG PO TABS
25.0000 mg | ORAL_TABLET | Freq: Two times a day (BID) | ORAL | Status: DC
  Administered 2014-04-02 – 2014-04-03 (×3): 25 mg via ORAL
  Filled 2014-04-02 (×3): qty 1

## 2014-04-02 MED ORDER — FUROSEMIDE 10 MG/ML IJ SOLN: 80.0000 mg | Freq: Once | INTRAMUSCULAR | Status: DC

## 2014-04-02 MED ORDER — IPRATROPIUM-ALBUTEROL 0.5-2.5 (3) MG/3ML IN SOLN
3.0000 mL | Freq: Three times a day (TID) | RESPIRATORY_TRACT | Status: DC
  Administered 2014-04-03 – 2014-04-04 (×5): 3 mL via RESPIRATORY_TRACT
  Filled 2014-04-02 (×5): qty 3

## 2014-04-02 MED ORDER — POTASSIUM CHLORIDE CRYS ER 20 MEQ PO TBCR
40.0000 meq | EXTENDED_RELEASE_TABLET | Freq: Once | ORAL | Status: AC
  Administered 2014-04-02: 40 meq via ORAL
  Filled 2014-04-02: qty 2

## 2014-04-02 MED ORDER — FUROSEMIDE 10 MG/ML IJ SOLN
40.0000 mg | Freq: Two times a day (BID) | INTRAMUSCULAR | Status: DC
  Administered 2014-04-02 – 2014-04-04 (×4): 40 mg via INTRAVENOUS
  Filled 2014-04-02 (×4): qty 4

## 2014-04-02 MED ORDER — ACETAMINOPHEN 650 MG RE SUPP: 650.0000 mg | Freq: Four times a day (QID) | RECTAL | Status: DC | PRN

## 2014-04-02 MED ORDER — FUROSEMIDE 10 MG/ML IJ SOLN
40.0000 mg | Freq: Once | INTRAMUSCULAR | Status: AC
  Administered 2014-04-02: 40 mg via INTRAVENOUS
  Filled 2014-04-02: qty 4

## 2014-04-02 MED ORDER — GUAIFENESIN-DM 100-10 MG/5ML PO SYRP
5.0000 mL | ORAL_SOLUTION | ORAL | Status: DC | PRN
  Administered 2014-04-03 (×2): 5 mL via ORAL
  Filled 2014-04-02 (×2): qty 5

## 2014-04-02 MED ORDER — SENNOSIDES-DOCUSATE SODIUM 8.6-50 MG PO TABS: 1.0000 | ORAL_TABLET | Freq: Every evening | ORAL | Status: DC | PRN

## 2014-04-02 MED ORDER — PANTOPRAZOLE SODIUM 40 MG PO TBEC
40.0000 mg | DELAYED_RELEASE_TABLET | Freq: Two times a day (BID) | ORAL | Status: DC
  Administered 2014-04-02 – 2014-04-04 (×4): 40 mg via ORAL
  Filled 2014-04-02 (×4): qty 1

## 2014-04-02 MED ORDER — SODIUM CHLORIDE 0.9 % IV SOLN: 250.0000 mL | INTRAVENOUS | Status: DC | PRN

## 2014-04-02 MED ORDER — HYDROCODONE-ACETAMINOPHEN 5-325 MG PO TABS: 1.0000 | ORAL_TABLET | ORAL | Status: DC | PRN

## 2014-04-02 MED ORDER — SODIUM CHLORIDE 0.9 % IJ SOLN
3.0000 mL | Freq: Two times a day (BID) | INTRAMUSCULAR | Status: DC
  Administered 2014-04-02 – 2014-04-03 (×3): 3 mL via INTRAVENOUS

## 2014-04-02 MED ORDER — ASPIRIN 81 MG PO CHEW
324.0000 mg | CHEWABLE_TABLET | Freq: Once | ORAL | Status: AC
  Administered 2014-04-02: 324 mg via ORAL
  Filled 2014-04-02: qty 4

## 2014-04-02 MED ORDER — IPRATROPIUM-ALBUTEROL 0.5-2.5 (3) MG/3ML IN SOLN
3.0000 mL | Freq: Once | RESPIRATORY_TRACT | Status: AC
  Administered 2014-04-02: 3 mL via RESPIRATORY_TRACT
  Filled 2014-04-02: qty 3

## 2014-04-02 MED ORDER — ACETAMINOPHEN 325 MG PO TABS
650.0000 mg | ORAL_TABLET | Freq: Once | ORAL | Status: AC
  Administered 2014-04-02: 650 mg via ORAL
  Filled 2014-04-02: qty 2

## 2014-04-02 MED ORDER — ALBUTEROL SULFATE (2.5 MG/3ML) 0.083% IN NEBU: 2.5000 mg | INHALATION_SOLUTION | RESPIRATORY_TRACT | Status: DC | PRN

## 2014-04-02 MED ORDER — IPRATROPIUM BROMIDE 0.02 % IN SOLN: 0.5000 mg | RESPIRATORY_TRACT | Status: DC

## 2014-04-02 MED ORDER — ENOXAPARIN SODIUM 40 MG/0.4ML ~~LOC~~ SOLN
40.0000 mg | SUBCUTANEOUS | Status: DC
  Administered 2014-04-02: 40 mg via SUBCUTANEOUS
  Filled 2014-04-02: qty 0.4

## 2014-04-02 MED ORDER — SODIUM CHLORIDE 0.9 % IJ SOLN
3.0000 mL | Freq: Two times a day (BID) | INTRAMUSCULAR | Status: DC
  Administered 2014-04-02 (×2): 3 mL via INTRAVENOUS

## 2014-04-02 MED ORDER — ALUM & MAG HYDROXIDE-SIMETH 200-200-20 MG/5ML PO SUSP: 30.0000 mL | Freq: Four times a day (QID) | ORAL | Status: DC | PRN

## 2014-04-02 MED ORDER — IPRATROPIUM-ALBUTEROL 0.5-2.5 (3) MG/3ML IN SOLN
3.0000 mL | RESPIRATORY_TRACT | Status: DC
  Administered 2014-04-02 (×2): 3 mL via RESPIRATORY_TRACT
  Filled 2014-04-02 (×2): qty 3

## 2014-04-02 MED ORDER — SODIUM CHLORIDE 0.9 % IJ SOLN: 3.0000 mL | INTRAMUSCULAR | Status: DC | PRN

## 2014-04-02 MED ORDER — ALBUTEROL SULFATE (2.5 MG/3ML) 0.083% IN NEBU: 2.5000 mg | INHALATION_SOLUTION | RESPIRATORY_TRACT | Status: DC

## 2014-04-02 MED ORDER — ACETAMINOPHEN 325 MG PO TABS
650.0000 mg | ORAL_TABLET | Freq: Four times a day (QID) | ORAL | Status: DC | PRN
  Administered 2014-04-02: 650 mg via ORAL
  Filled 2014-04-02: qty 2

## 2014-04-02 NOTE — ED Notes (Signed)
Respiratory paged for ED for breathing tx.

## 2014-04-02 NOTE — ED Notes (Signed)
Report given to [facility] on 3A for room 331.

## 2014-04-02 NOTE — ED Notes (Signed)
PT c/o shortness of breath and denies any chest pain worsening x3 days. PT stated "I think I have fluid built up again."

## 2014-04-02 NOTE — H&P (Signed)
[facility] History and Physical  COLA SHEHATA WUJ:[id number] DOB: 05-07-1966 DOA: 04/02/2014  Referring physician:  PCP: No PCP Per Patient   Chief Complaint: sob  HPI: [name] is a 48 y.o. male with a past medical history that includes hypertension, hyperlipidemia, GERD, peptic ulcer disease, morbid obesity, tobacco use, CHF presents to the emergency department with chief complaint of worsening shortness of breath. Initial evaluation in the emergency department concerning for acute on chronic CHF.  Patient reports that 3 days ago he felt like he was starting with a "cold". He said he had nasal congestion and postnasal drip and frequent coughing. This worsened and moved to his chest. He developed shortness of breath 2 days ago. The shortness of breath and cough persisted and worsened. He states that his cough became productive with thick yellow sputum. Associated symptoms include chills subjective fever decreased appetite generalized weakness. He denies any chest pain palpitations orthopnea or worsening lower extremity edema. He admits to noncompliance in the past with his medications and followup with his care providers. He states it's been at least one year since he's been seen. Workup in the emergency department includes basic metabolic panel significant for potassium of 3.5, complete blood count is unremarkable, initial troponin negative, proBNP 746.5, urinalysis is negative, chest x-ray with cardiomegaly and vascular congestion, EKG with no acute changes.  In the emergency department he has a temperature of 100.9 is hypertensive with a blood pressure 125/104 heart rate 90 respiratory 18 sats 99% on room air. He is given nebs and 40 mg of Lasix IV in the emergency department. He states that he is already feeling better.  Review of Systems:  10 point review of systems completed and all systems are negative except as indicated in the history of present illness    Past Medical  History  Diagnosis Date  . Hypertensive heart disease     Hypertensive heart disease with prior episodes of CHF  . Hyperlipidemia   . Hypertension   . Gastroesophageal reflux disease   . Osteoarthritis   . Peptic ulcer disease   . Morbid obesity     /sleep apnea  . Allergic rhinitis     plus h/o asthma  . Tobacco abuse, in remission     Cigarettes discontinued in 2010  . CHF (congestive heart failure)    Past Surgical History  Procedure Laterality Date  . None     Social History:  reports that he quit smoking about 6 years ago. He quit smokeless tobacco use about 8 years ago. He reports that he drinks alcohol. He reports that he does not use illicit drugs. He is on disability he lives with his mother and brother he is independent with ADLs. Allergies  Allergen Reactions  . Bee Venom Shortness Of Breath and Swelling  . Aspirin Other (See Comments)    Chewable 81 mg tablets upsets patients stomach    No family history on file.   Prior to Admission medications   Medication Sig Start Date End Date Taking? Authorizing Provider  albuterol-ipratropium (COMBIVENT) 18-103 MCG/ACT inhaler Inhale 2 puffs into the lungs every 6 (six) hours as needed for wheezing or shortness of breath.    Yes Historical Provider, MD  beclomethasone (QVAR) 80 MCG/ACT inhaler Inhale 1 puff into the lungs as needed (shortness of breath).    Yes Historical Provider, MD   Physical Exam: Filed Vitals:   04/02/14 1245 04/02/14 1327 04/02/14 1330 04/02/14 1500  BP:   167/101 155/104  Pulse: 88  83 90  Temp:  100.9 F (38.3 C)  99.3 F (37.4 C)  TempSrc:  Oral  Oral  Resp: 20  14 18   Height:      Weight:      SpO2: 99%  98% 99%    Wt Readings from Last 3 Encounters:  04/02/14 162.841 kg (359 lb)  11/27/12 156.491 kg (345 lb)  12/10/11 157.398 kg (347 lb)    General:  Appears calm and comfortable Eyes: PERRL, normal lids, irises & conjunctiva ENT: grossly normal hearing, lips & tongue Neck: no  LAD, masses or thyromegaly Cardiovascular: RRR, no m/r/g. Chronic venous changes bilaterally. Lower extremity edema with right greater than left. Not pitting. vericose vein on right. No tenderness Respiratory:  Normal respiratory effort. Rest sounds very diminished throughout. Very fine crackles particularly on right base. No wheeze Abdomen: soft, ntnd obese positive bowel sounds throughout Skin: no rash or induration seen on limited exam Musculoskeletal: grossly normal tone BUE/BLE Psychiatric: grossly normal mood and affect, speech fluent and appropriate Neurologic: grossly non-focal.speech clear facial symmetry           Labs on Admission:  Basic Metabolic Panel:  Recent Labs Lab 04/02/14 1031  NA 140  K 3.5*  CL 103  CO2 25  GLUCOSE 99  BUN 15  CREATININE 1.05  CALCIUM 8.4   Liver Function Tests: No results found for this basename: AST, ALT, ALKPHOS, BILITOT, PROT, ALBUMIN,  in the last 168 hours No results found for this basename: LIPASE, AMYLASE,  in the last 168 hours No results found for this basename: AMMONIA,  in the last 168 hours CBC:  Recent Labs Lab 04/02/14 1031  WBC 4.5  NEUTROABS 3.2  HGB 14.6  HCT 42.5  MCV 84.2  PLT 174   Cardiac Enzymes:  Recent Labs Lab 04/02/14 1031  TROPONINI <0.30    BNP (last 3 results)  Recent Labs  04/02/14 1031  PROBNP 746.5*   CBG: No results found for this basename: GLUCAP,  in the last 168 hours  Radiological Exams on Admission: Dg Chest 2 View  04/02/2014   CLINICAL DATA:  Shortness of breath.  Cough for 1 week.  EXAM: CHEST  2 VIEW  COMPARISON:  11/27/2012.  FINDINGS: Mild cardiomegaly for projection. Monitoring leads project over the chest. Basilar atelectasis. Suboptimal lateral view because of body habitus and arm position. No pleural effusion. No airspace opacity. Pulmonary vascular congestion is present without alveolar pulmonary edema.  IMPRESSION: Cardiomegaly and pulmonary vascular congestion.    Electronically Signed   By: Andreas NewportGeoffrey  Lamke M.D.   On: 04/02/2014 10:59    EKG: Independently reviewed sinus rhythm  Assessment/Plan Principal Problem:   CHF (congestive heart failure): Acute on chronic likely diastolic. Likely related to uncontrolled hypertension this patient has been noncompliant with medication and followup care for the last year and a half. Chart review indicates last echo in 2009 yielding an EF of 55-65%. Will admit to telemetry. Will continue IV Lasix. Will monitor intake and output. Daily weights. Will get an updated echocardiogram to evaluate LV function. Cycle troponins.  Active Problems:  Essential hypertension: Uncontrolled. Chart review indicates patient with history of noncompliance with medication and or difficulty controlling blood pressure. Will start him on metoprolol 25 mg twice a day with parameters. Monitor closely   Asthma: Appears stable at baseline. He does report feeling better after breathing treatment in the emergency department. Breath sounds are quite diminished but I hear no wheezes. Will continue  nebulizers and monitor. His oxygenation levels remained greater than 90% on room air with exertion    OBESITY, MORBID: BMI 48.8.    Anxiety state: Appears stable at baseline.      GERD: Stable at baseline. Will provide PPI. Chart review indicates that medications for blood pressure and CHF caused patient "stomach trouble". He was scheduled to follow up with GI but never did.    Sleep apnea: He states he does not have a CPAP machine.    Code Status: full DVT Prophylaxis: Family Communication: none present Disposition Plan: home hopefully 24-48 hours  Time spent: 70 minutes  [facility] M [facility]

## 2014-04-02 NOTE — ED Provider Notes (Signed)
CSN: [id number]     Arrival date & time 04/02/14  1004 History   First MD Initiated Contact with Patient 04/02/14 1012     Chief Complaint  Patient presents with  . Shortness of Breath     (Consider location/radiation/quality/duration/timing/severity/associated sxs/prior Treatment) The history is provided by the patient.   [name] is a 48 y.o. male with a history of HTN and prior history of chf presenting with increased shortness of breath without chest pain for the past 3 days.  He believes he has "fluid build up" again.  He describes orthopnea along with a cough sometimes productive of clear to yellow sputum.  He has lower extremity edema, but states this is baseline and not worsened today and denies pain in his legs.  He does have a low grade fever here upon arrival, but denies any awareness of feeling febrile at home.  He has taken no medicines for his symptoms.  He currently does not have a pcp.  He was followed by Dr. Dietrich Pates, when he retired, pt states was told to establish care with Dr Juanetta Gosling but has not done this yet.     Past Medical History  Diagnosis Date  . Hypertensive heart disease     Hypertensive heart disease with prior episodes of CHF  . Hyperlipidemia   . Hypertension   . Gastroesophageal reflux disease   . Osteoarthritis   . Peptic ulcer disease   . Morbid obesity     /sleep apnea  . Allergic rhinitis     plus h/o asthma  . Tobacco abuse, in remission     Cigarettes discontinued in 2010  . CHF (congestive heart failure)    Past Surgical History  Procedure Laterality Date  . None     No family history on file. History  Substance Use Topics  . Smoking status: Former Smoker    Quit date: 06/23/2007  . Smokeless tobacco: Former Neurosurgeon    Quit date: 01/22/2006     Comment: 1 ppd former smoker  . Alcohol Use: Yes     Comment: occasional    Review of Systems  Constitutional: Positive for fever.  HENT: Negative for congestion and sore throat.    Eyes: Negative.   Respiratory: Positive for cough and shortness of breath. Negative for chest tightness and wheezing.   Cardiovascular: Negative for chest pain.  Gastrointestinal: Negative for nausea, abdominal pain and abdominal distention.  Genitourinary: Negative.   Musculoskeletal: Negative for arthralgias, joint swelling and neck pain.  Skin: Negative.  Negative for color change, rash and wound.  Neurological: Negative for dizziness, weakness, light-headedness, numbness and headaches.  Psychiatric/Behavioral: Negative.       Allergies  Bee venom and Aspirin  Home Medications   Prior to Admission medications   Medication Sig Start Date End Date Taking? Authorizing Provider  albuterol-ipratropium (COMBIVENT) 18-103 MCG/ACT inhaler Inhale 2 puffs into the lungs every 6 (six) hours as needed for wheezing or shortness of breath.    Yes Historical Provider, MD  beclomethasone (QVAR) 80 MCG/ACT inhaler Inhale 1 puff into the lungs as needed (shortness of breath).    Yes Historical Provider, MD   BP 167/101  Pulse 83  Temp(Src) 100.9 F (38.3 C) (Oral)  Resp 14  Ht 6' (1.829 m)  Wt 359 lb (162.841 kg)  BMI 48.68 kg/m2  SpO2 98% Physical Exam  Nursing note and vitals reviewed. Constitutional: He appears well-developed and well-nourished.  HENT:  Head: Normocephalic and atraumatic.  Eyes: Conjunctivae  are normal.  Neck: Normal range of motion.  Cardiovascular: Regular rhythm, normal heart sounds and intact distal pulses.  Tachycardia present.   Pulmonary/Chest: Effort normal. No respiratory distress. He has no wheezes. He has no rhonchi. He has rales in the right lower field.  Abdominal: Soft. Bowel sounds are normal. There is no tenderness.  Musculoskeletal: Normal range of motion.  Edema bilateral lower legs, right greater,  No pitting.  Large varicosities, also worsened left.  No tenderness, no palpable cords, no erythema.  Dorsal pedal pulses intact.  Neurological: He is  alert.  Skin: Skin is warm and dry.  Psychiatric: He has a normal mood and affect.    ED Course  Procedures (including critical care time) Labs Review Labs Reviewed  BASIC METABOLIC PANEL - Abnormal; Notable for the following:    Potassium 3.5 (*)    GFR calc non Af Amer 84 (*)    All other components within normal limits  PRO B NATRIURETIC PEPTIDE - Abnormal; Notable for the following:    Pro B Natriuretic peptide (BNP) 746.5 (*)    All other components within normal limits  CBC WITH DIFFERENTIAL  TROPONIN I  URINALYSIS, ROUTINE W REFLEX MICROSCOPIC    Imaging Review Dg Chest 2 View  04/02/2014   CLINICAL DATA:  Shortness of breath.  Cough for 1 week.  EXAM: CHEST  2 VIEW  COMPARISON:  11/27/2012.  FINDINGS: Mild cardiomegaly for projection. Monitoring leads project over the chest. Basilar atelectasis. Suboptimal lateral view because of body habitus and arm position. No pleural effusion. No airspace opacity. Pulmonary vascular congestion is present without alveolar pulmonary edema.  IMPRESSION: Cardiomegaly and pulmonary vascular congestion.   Electronically Signed   By: Andreas NewportGeoffrey  Lamke M.D.   On: 04/02/2014 10:59     EKG Interpretation   Date/Time:  Monday April 02 2014 10:11:53 EDT Ventricular Rate:  91 PR Interval:  156 QRS Duration: 87 QT Interval:  366 QTC Calculation: 450 R Axis:   50 Text Interpretation:  Sinus rhythm Atrial premature complexes in couplets  Probable left atrial enlargement Baseline wander in lead(s) III  Nonspecific T wave abnormality Confirmed by Manus GunningANCOUR  MD, STEPHEN ([id number])  on 04/02/2014 10:14:12 AM      MDM   Final diagnoses:  Acute on chronic congestive heart failure, unspecified congestive heart failure type    Pt with non compliance and no followup care. Acute on chronic chf.  PT seen by Dr Manus Gunningancour as well.  Pt would most benefit from admission, concern for decompensation if dc home.  Discussed with Dr. Kerry HoughMemon who will admit pt, temp  admit orders placed.    Burgess AmorJulie Shereda Graw, PA-C 04/02/14 1417

## 2014-04-02 NOTE — ED Notes (Signed)
PT ambulated from room to nurses station and back and maintain oxygen saturation >96% on room air.

## 2014-04-02 NOTE — ED Provider Notes (Signed)
Medical screening examination/treatment/procedure(s) were conducted as a shared visit with non-physician practitioner(s) and myself.  I personally evaluated the patient during the encounter.  3 day history of URI symptoms and SOB, productive cough. Stable LE edema.  NO chest pain.  Febrile here.  Decreased BS throughout.  Nonpitting edema R>L with varicose veins. CXR with CHF.     EKG Interpretation   Date/Time:  Monday April 02 2014 10:11:53 EDT Ventricular Rate:  91 PR Interval:  156 QRS Duration: 87 QT Interval:  366 QTC Calculation: 450 R Axis:   50 Text Interpretation:  Sinus rhythm Atrial premature complexes in couplets  Probable left atrial enlargement Baseline wander in lead(s) III  Nonspecific T wave abnormality Confirmed by Manus Gunning  MD, Kayra Crowell ([id number])  on 04/02/2014 10:14:12 AM       Glynn Octave, MD 04/02/14 1842

## 2014-04-02 NOTE — H&P (Signed)
Patient seen and examined. Agree with note as above  Patient was admitted to the hospital for progressive shortness of breath. He describes upper respiratory tract symptoms which progressively spread down to his chest. He does not describe any worsening lower extremity edema, he appears to have chronic lower extremity edema. Chest x-ray in the emergency room indicated evidence of pulmonary edema. The patient has a history of hypertension and has been noncompliant with his medications. He also has sleep apnea and has not been compliant with CPAP therapy. He was given one dose of Lasix in the emergency room with significant urine output and reports that his symptoms are improving. We'll obtain echocardiogram. Continue IV Lasix for now. He has been started on Lopressor for blood pressure control  Lopez Dentinger

## 2014-04-03 ENCOUNTER — Encounter (HOSPITAL_COMMUNITY): Payer: Self-pay | Admitting: Internal Medicine

## 2014-04-03 ENCOUNTER — Inpatient Hospital Stay (HOSPITAL_COMMUNITY): Payer: Medicare Other

## 2014-04-03 DIAGNOSIS — I517 Cardiomegaly: Secondary | ICD-10-CM

## 2014-04-03 DIAGNOSIS — R509 Fever, unspecified: Secondary | ICD-10-CM

## 2014-04-03 DIAGNOSIS — I4891 Unspecified atrial fibrillation: Secondary | ICD-10-CM | POA: Diagnosis not present

## 2014-04-03 LAB — COMPREHENSIVE METABOLIC PANEL
ALBUMIN: 3.9 g/dL (ref 3.5–5.2)
ALT: 19 U/L (ref 0–53)
AST: 23 U/L (ref 0–37)
Alkaline Phosphatase: 48 U/L (ref 39–117)
Anion gap: 13 (ref 5–15)
BUN: 18 mg/dL (ref 6–23)
CO2: 26 mEq/L (ref 19–32)
Calcium: 8.7 mg/dL (ref 8.4–10.5)
Chloride: 103 mEq/L (ref 96–112)
Creatinine, Ser: 1.17 mg/dL (ref 0.50–1.35)
GFR calc Af Amer: 85 mL/min — ABNORMAL LOW (ref >90)
GFR calc non Af Amer: 74 mL/min — ABNORMAL LOW (ref >90)
Glucose, Bld: 91 mg/dL (ref 70–99)
POTASSIUM: 3.6 meq/L — AB (ref 3.7–5.3)
Sodium: 142 mEq/L (ref 137–147)
Total Bilirubin: 1.5 mg/dL — ABNORMAL HIGH (ref 0.3–1.2)
Total Protein: 7.3 g/dL (ref 6.0–8.3)

## 2014-04-03 LAB — CBC
HEMATOCRIT: 44.6 % (ref 39.0–52.0)
Hemoglobin: 14.9 g/dL (ref 13.0–17.0)
MCH: 28.5 pg (ref 26.0–34.0)
MCHC: 33.4 g/dL (ref 30.0–36.0)
MCV: 85.3 fL (ref 78.0–100.0)
Platelets: 173 10*3/uL (ref 150–400)
RBC: 5.23 MIL/uL (ref 4.22–5.81)
RDW: 13.1 % (ref 11.5–15.5)
WBC: 3.3 10*3/uL — ABNORMAL LOW (ref 4.0–10.5)

## 2014-04-03 LAB — INFLUENZA PANEL BY PCR (TYPE A & B)
H1N1 flu by pcr: NOT DETECTED
Influenza A By PCR: NEGATIVE
Influenza B By PCR: NEGATIVE

## 2014-04-03 LAB — TSH: TSH: 0.322 u[IU]/mL — AB (ref 0.350–4.500)

## 2014-04-03 MED ORDER — DILTIAZEM HCL 25 MG/5ML IV SOLN
INTRAVENOUS | Status: AC
  Filled 2014-04-03: qty 5

## 2014-04-03 MED ORDER — METOPROLOL TARTRATE 50 MG PO TABS
50.0000 mg | ORAL_TABLET | Freq: Two times a day (BID) | ORAL | Status: DC
  Administered 2014-04-03 – 2014-04-04 (×2): 50 mg via ORAL
  Filled 2014-04-03 (×2): qty 1

## 2014-04-03 MED ORDER — DILTIAZEM HCL 25 MG/5ML IV SOLN
10.0000 mg | Freq: Once | INTRAVENOUS | Status: AC
  Administered 2014-04-03: 10 mg via INTRAVENOUS

## 2014-04-03 MED ORDER — POTASSIUM CHLORIDE CRYS ER 20 MEQ PO TBCR
40.0000 meq | EXTENDED_RELEASE_TABLET | Freq: Once | ORAL | Status: AC
  Administered 2014-04-03: 40 meq via ORAL
  Filled 2014-04-03: qty 2

## 2014-04-03 MED ORDER — ENOXAPARIN SODIUM 80 MG/0.8ML ~~LOC~~ SOLN
80.0000 mg | SUBCUTANEOUS | Status: DC
  Administered 2014-04-03: 80 mg via SUBCUTANEOUS
  Filled 2014-04-03: qty 0.8

## 2014-04-03 MED ORDER — DILTIAZEM HCL 30 MG PO TABS
30.0000 mg | ORAL_TABLET | Freq: Four times a day (QID) | ORAL | Status: DC
  Administered 2014-04-04 (×2): 30 mg via ORAL
  Filled 2014-04-03 (×2): qty 1

## 2014-04-03 MED ORDER — LEVOFLOXACIN 500 MG PO TABS
500.0000 mg | ORAL_TABLET | Freq: Every day | ORAL | Status: DC
  Administered 2014-04-03: 500 mg via ORAL
  Filled 2014-04-03: qty 1

## 2014-04-03 NOTE — Progress Notes (Signed)
Spoke with MD on call. Patient had temp of 103.1. Patient states he feels like he has cold or Pneumonia. Patient felt cold on moment and hot the next. FLU Scab order and protocol in place awaiting test results. Patient consistently asking for ice water all night. Cough syrup given per patient request. Patient resting at this time.

## 2014-04-03 NOTE — Progress Notes (Signed)
Patient asymptomatic but had some EKG changes. NSR around 10pm last night. This morning patient is afib with RVR. MD notified but no response. Will follow up with MD this morning.

## 2014-04-03 NOTE — Care Management Utilization Note (Signed)
UR completed 

## 2014-04-03 NOTE — Progress Notes (Signed)
Patient declined CPAP use tonight. No unit brought to room.

## 2014-04-03 NOTE — Progress Notes (Signed)
[facility] PROGRESS NOTE  Tammi KlippelMark T Crudup GNF:[id number] DOB: 04/19/1966 DOA: 04/02/2014 PCP: No PCP Per Patient  Assessment/Plan: Principal Problem:  CHF (congestive heart failure): Acute on chronic likely diastolic. Likely related to uncontrolled hypertension as patient has been noncompliant with medication and followup care for the last year and a half. Improved.  Will continue IV Lasix. Will monitor intake and output. Daily weights. Await echocardiogram to evaluate LV function. Troponins neg x3. Repeat EKG with afib RVR rate of 102. Will continue metoprolol at current dose and monitor.   Active Problems:  Fever: etiology unclear but suspect viral respiratory infection. Clinically improved. Will repeat chest xray. U/A unremarkable. Obtain sputum culture. Will hold off on antibiotics at this time  afib with RVR: likely related to #2. Rate controlled on BB. Await echo. monitor  Essential hypertension: Improved control on metoprolol but still high side of normal. Will increase dose. Monitor. Monitor closely   Asthma: remains stable at baseline. Breath sounds remain quite diminished but I hear no wheezes. Will continue nebulizers and monitor. His oxygenation levels remained greater than 93% on room air with exertion   OBESITY, MORBID: BMI 48.8. Nutritional consult  Anxiety state: Remains stable at baseline.   GERD: Stable at baseline. Will provide PPI.    Sleep apnea: He states he does not have a CPAP machine. Will request respiratory consult   Code Status: full Family Communication: none present Disposition Plan: home hopefully 24 hours   Consultants:  none  Procedures:  echo  Antibiotics:  none  HPI/Subjective: Sitting in chair eating breakfast. States "i feel a whole lot better". Denies pain/discomfort  Objective: Filed Vitals:   04/03/14 0558  BP: 154/71  Pulse: 111  Temp: 100 F (37.8 C)  Resp: 20    Intake/Output Summary (Last 24 hours) at 04/03/14  1004 Last data filed at 04/03/14 0900  Gross per 24 hour  Intake    440 ml  Output    450 ml  Net    -10 ml   Filed Weights   04/02/14 1011 04/02/14 1617 04/03/14 0558  Weight: 162.841 kg (359 lb) 164.202 kg (362 lb) 160.1 kg (352 lb 15.3 oz)    Exam:   General:  Obese appears comfortable. No acute distress  Cardiovascular: irregularly irregular. No MGR trace LE edema. Large vericose vein on right. No pitting edema  Respiratory: normal effort BS very diminished. i hear no wheeze or crackles  Abdomen: 0bese soft +BS non-tender to palpation  Musculoskeletal: no clubbing or cyanosis   Data Reviewed: Basic Metabolic Panel:  Recent Labs Lab 04/02/14 1031 04/03/14 0609  NA 140 142  K 3.5* 3.6*  CL 103 103  CO2 25 26  GLUCOSE 99 91  BUN 15 18  CREATININE 1.05 1.17  CALCIUM 8.4 8.7   Liver Function Tests:  Recent Labs Lab 04/03/14 0609  AST 23  ALT 19  ALKPHOS 48  BILITOT 1.5*  PROT 7.3  ALBUMIN 3.9   No results found for this basename: LIPASE, AMYLASE,  in the last 168 hours No results found for this basename: AMMONIA,  in the last 168 hours CBC:  Recent Labs Lab 04/02/14 1031 04/03/14 0609  WBC 4.5 3.3*  NEUTROABS 3.2  --   HGB 14.6 14.9  HCT 42.5 44.6  MCV 84.2 85.3  PLT 174 173   Cardiac Enzymes:  Recent Labs Lab 04/02/14 1031 04/02/14 1518 04/02/14 2155  TROPONINI <0.30 <0.30 <0.30   BNP (last 3 results)  Recent Labs  04/02/14 1031  PROBNP 746.5*   CBG: No results found for this basename: GLUCAP,  in the last 168 hours  No results found for this or any previous visit (from the past 240 hour(s)).   Studies: Dg Chest 2 View  04/02/2014   CLINICAL DATA:  Shortness of breath.  Cough for 1 week.  EXAM: CHEST  2 VIEW  COMPARISON:  11/27/2012.  FINDINGS: Mild cardiomegaly for projection. Monitoring leads project over the chest. Basilar atelectasis. Suboptimal lateral view because of body habitus and arm position. No pleural effusion.  No airspace opacity. Pulmonary vascular congestion is present without alveolar pulmonary edema.  IMPRESSION: Cardiomegaly and pulmonary vascular congestion.   Electronically Signed   By: Andreas Newport M.D.   On: 04/02/2014 10:59    Scheduled Meds: . enoxaparin (LOVENOX) injection  80 mg Subcutaneous Q24H  . furosemide  40 mg Intravenous BID  . ipratropium-albuterol  3 mL Nebulization TID  . metoprolol tartrate  50 mg Oral BID  . pantoprazole  40 mg Oral BID AC  . potassium chloride  40 mEq Oral Once  . sodium chloride  3 mL Intravenous Q12H  . sodium chloride  3 mL Intravenous Q12H   Continuous Infusions:   Principal Problem:   CHF (congestive heart failure) Active Problems:   OBESITY, MORBID   Anxiety state   Essential hypertension   Asthma   GERD   Sleep apnea   A-fib    Time spent: 35 minutes    [facility] M  [facility] Pager [phone]. If 7PM-7AM, please contact night-coverage at [email or web address], password [facility] 04/03/2014, 10:04 AM  LOS: 1 day

## 2014-04-03 NOTE — Care Management Note (Signed)
    Page 1 of 1   04/05/2014     3:18:25 PM CARE MANAGEMENT NOTE 04/05/2014  Patient:  [name], [name]   Account Number:  [id number]  Date Initiated:  04/03/2014  Documentation initiated by:  Anibal Henderson  Subjective/Objective Assessment:   Admitted with CHF, fever, and flu swab was done. Pt is from home, is independent,  and plans to return home at D/C, and says he is ready to go today!     Action/Plan:   Anticipated DC Date:  04/04/2014   Anticipated DC Plan:  HOME/SELF CARE      DC Planning Services  CM consult      Choice offered to / List presented to:             Status of service:  Completed, signed off Medicare Important Message given?   (If response is "NO", the following Medicare IM given date fields will be blank) Date Medicare IM given:   Medicare IM given by:   Date Additional Medicare IM given:   Additional Medicare IM given by:    Discharge Disposition:    Per UR Regulation:  Reviewed for med. necessity/level of care/duration of stay  If discussed at Long Length of Stay Meetings, dates discussed:    Comments:  04/05/14  1515 Sheri Gatchel RN/CM Pt D/C home last PM 04/03/14 1530 Anibal Henderson RN/CM

## 2014-04-03 NOTE — Progress Notes (Signed)
Patient seen and examined. Note reviewed.  He has been admitted to the hospital with progressive shortness of breath. His initial chest x-ray showed probable pulmonary edema linear mildly elevated BNP. The patient was started on intravenous Lasix, and urine output the patient is starting to feel better. I do not feel that intake and output accurately reflects his degree of diuresis, since initially he was not collecting his urine to be measured. Since his admission, patient has developed fevers. He describes "chest cold". PA chest x-ray. Flu PCR is pending. He does not appear septic or toxic. He has gone into rapid atrial fibrillation. I asked him about this further, he does report that he has been told in the past that " he has an irregular heart beat". He also thinks that he may have been recommended anticoagulation, although he is not sure. He was started on Lopressor and we will also start diltiazem for better rate control. Echocardiogram is currently pending. We'll request cardiology consultation for assistance with management. The patient also has history of sleep apnea has been started on CPAP. This should hopefully help with blood pressure management.

## 2014-04-03 NOTE — Progress Notes (Signed)
  Echocardiogram 2D Echocardiogram has been performed.  [name] 04/03/2014, 10:43 AM

## 2014-04-04 DIAGNOSIS — I509 Heart failure, unspecified: Secondary | ICD-10-CM

## 2014-04-04 DIAGNOSIS — I110 Hypertensive heart disease with heart failure: Principal | ICD-10-CM

## 2014-04-04 DIAGNOSIS — E785 Hyperlipidemia, unspecified: Secondary | ICD-10-CM

## 2014-04-04 DIAGNOSIS — I4891 Unspecified atrial fibrillation: Secondary | ICD-10-CM

## 2014-04-04 DIAGNOSIS — J189 Pneumonia, unspecified organism: Secondary | ICD-10-CM

## 2014-04-04 DIAGNOSIS — I5033 Acute on chronic diastolic (congestive) heart failure: Secondary | ICD-10-CM

## 2014-04-04 DIAGNOSIS — R0602 Shortness of breath: Secondary | ICD-10-CM

## 2014-04-04 DIAGNOSIS — Z9114 Patient's other noncompliance with medication regimen: Secondary | ICD-10-CM

## 2014-04-04 DIAGNOSIS — I472 Ventricular tachycardia: Secondary | ICD-10-CM

## 2014-04-04 LAB — BASIC METABOLIC PANEL
Anion gap: 13 (ref 5–15)
BUN: 21 mg/dL (ref 6–23)
CALCIUM: 9.1 mg/dL (ref 8.4–10.5)
CO2: 26 mEq/L (ref 19–32)
CREATININE: 1.14 mg/dL (ref 0.50–1.35)
Chloride: 104 mEq/L (ref 96–112)
GFR, EST AFRICAN AMERICAN: 88 mL/min — AB (ref >90)
GFR, EST NON AFRICAN AMERICAN: 76 mL/min — AB (ref >90)
Glucose, Bld: 89 mg/dL (ref 70–99)
Potassium: 4.3 mEq/L (ref 3.7–5.3)
Sodium: 143 mEq/L (ref 137–147)

## 2014-04-04 LAB — CBC
HEMATOCRIT: 47.1 % (ref 39.0–52.0)
HEMOGLOBIN: 15.7 g/dL (ref 13.0–17.0)
MCH: 28.3 pg (ref 26.0–34.0)
MCHC: 33.3 g/dL (ref 30.0–36.0)
MCV: 84.9 fL (ref 78.0–100.0)
Platelets: 192 10*3/uL (ref 150–400)
RBC: 5.55 MIL/uL (ref 4.22–5.81)
RDW: 12.9 % (ref 11.5–15.5)
WBC: 2.7 10*3/uL — ABNORMAL LOW (ref 4.0–10.5)

## 2014-04-04 LAB — MAGNESIUM: Magnesium: 2.3 mg/dL (ref 1.5–2.5)

## 2014-04-04 LAB — RESPIRATORY VIRUS PANEL
Adenovirus: NOT DETECTED
INFLUENZA B 1: NOT DETECTED
Influenza A H1: NOT DETECTED
Influenza A H3: NOT DETECTED
Influenza A: NOT DETECTED
METAPNEUMOVIRUS: NOT DETECTED
PARAINFLUENZA 3 A: NOT DETECTED
Parainfluenza 1: NOT DETECTED
Parainfluenza 2: NOT DETECTED
RESPIRATORY SYNCYTIAL VIRUS A: DETECTED — AB
RHINOVIRUS: NOT DETECTED
Respiratory Syncytial Virus B: NOT DETECTED

## 2014-04-04 MED ORDER — FUROSEMIDE 20 MG PO TABS: 20.0000 mg | ORAL_TABLET | Freq: Every day | ORAL | Status: DC

## 2014-04-04 MED ORDER — LEVOFLOXACIN 500 MG PO TABS: 500.0000 mg | ORAL_TABLET | Freq: Every day | ORAL | Status: DC

## 2014-04-04 MED ORDER — BECLOMETHASONE DIPROPIONATE 80 MCG/ACT IN AERS: 1.0000 | INHALATION_SPRAY | RESPIRATORY_TRACT | Status: DC | PRN

## 2014-04-04 MED ORDER — POTASSIUM CHLORIDE CRYS ER 20 MEQ PO TBCR: 20.0000 meq | EXTENDED_RELEASE_TABLET | Freq: Every day | ORAL | Status: DC

## 2014-04-04 MED ORDER — DILTIAZEM HCL ER COATED BEADS 120 MG PO CP24: 120.0000 mg | ORAL_CAPSULE | Freq: Every day | ORAL | Status: DC

## 2014-04-04 MED ORDER — DILTIAZEM HCL ER COATED BEADS 120 MG PO CP24
120.0000 mg | ORAL_CAPSULE | Freq: Every day | ORAL | Status: DC
  Administered 2014-04-04: 120 mg via ORAL
  Filled 2014-04-04: qty 1

## 2014-04-04 MED ORDER — LISINOPRIL 10 MG PO TABS: 10.0000 mg | ORAL_TABLET | Freq: Every day | ORAL | Status: DC

## 2014-04-04 MED ORDER — IPRATROPIUM-ALBUTEROL 18-103 MCG/ACT IN AERO: 2.0000 | INHALATION_SPRAY | Freq: Four times a day (QID) | RESPIRATORY_TRACT | Status: DC | PRN

## 2014-04-04 MED ORDER — METOPROLOL TARTRATE 50 MG PO TABS: 50.0000 mg | ORAL_TABLET | Freq: Two times a day (BID) | ORAL | Status: DC

## 2014-04-04 MED ORDER — LISINOPRIL 10 MG PO TABS
10.0000 mg | ORAL_TABLET | Freq: Every day | ORAL | Status: DC
  Administered 2014-04-04: 10 mg via ORAL
  Filled 2014-04-04: qty 1

## 2014-04-04 NOTE — Consult Note (Signed)
Reason for Consult: Hypertension, CHF Referring Physician: PTH  [name] [name] is an 48 y.o. male.  HPI: This is a 48 year old former patient Dr. Lattie Haw who has history of uncontrolled hypertension due to  noncompliance. He was admitted with acute on chronic heart failure. Patient has not taken medicines in several years. He complained of a three-day history of cough, shortness of breath , shaking chills and fatigue. In the emergency room his blood pressure was 125/104 temperature 100.9 BNP 746 and chest x-ray showed vascular congestion. EKG showed normal sinus rhythm but he has since gone into atrial fibrillation and had 9 beat run of wide-complex tachycardia. Patient says he often feels his heart going fast and slow. He quit smoking and does not drink alcohol. He is disabled and lives with his mother.  Past Medical History  Diagnosis Date  . Hypertensive heart disease     Hypertensive heart disease with prior episodes of CHF  . Hyperlipidemia   . Hypertension   . Gastroesophageal reflux disease   . Osteoarthritis   . Peptic ulcer disease   . Morbid obesity     /sleep apnea  . Allergic rhinitis     plus h/o asthma  . Tobacco abuse, in remission     Cigarettes discontinued in 2010  . CHF (congestive heart failure)   . A-fib     2015    Past Surgical History  Procedure Laterality Date  . None      No family history on file.  Social History:  reports that he quit smoking about 6 years ago. He quit smokeless tobacco use about 8 years ago. He reports that he drinks alcohol. He reports that he does not use illicit drugs.  Allergies:  Allergies  Allergen Reactions  . Bee Venom Shortness Of Breath and Swelling  . Aspirin Other (See Comments)    Chewable 81 mg tablets upsets patients stomach    Medications: Scheduled Meds: . diltiazem  30 mg Oral 4 times per day  . enoxaparin (LOVENOX) injection  80 mg Subcutaneous Q24H  . furosemide  40 mg Intravenous BID  .  ipratropium-albuterol  3 mL Nebulization TID  . levofloxacin  500 mg Oral QHS  . metoprolol tartrate  50 mg Oral BID  . pantoprazole  40 mg Oral BID AC  . sodium chloride  3 mL Intravenous Q12H  . sodium chloride  3 mL Intravenous Q12H   Continuous Infusions:  PRN Meds:.sodium chloride, acetaminophen, acetaminophen, albuterol, alum & mag hydroxide-simeth, guaiFENesin-dextromethorphan, HYDROcodone-acetaminophen, senna-docusate, sodium chloride   Results for orders placed during the hospital encounter of 04/02/14 (from the past 48 hour(s))  CBC WITH DIFFERENTIAL     Status: None   Collection Time    04/02/14 10:31 AM      Result Value Ref Range   WBC 4.5  4.0 - 10.5 K/uL   RBC 5.05  4.22 - 5.81 MIL/uL   Hemoglobin 14.6  13.0 - 17.0 g/dL   HCT 42.5  39.0 - 52.0 %   MCV 84.2  78.0 - 100.0 fL   MCH 28.9  26.0 - 34.0 pg   MCHC 34.4  30.0 - 36.0 g/dL   RDW 13.0  11.5 - 15.5 %   Platelets 174  150 - 400 K/uL   Neutrophils Relative % 72  43 - 77 %   Neutro Abs 3.2  1.7 - 7.7 K/uL   Lymphocytes Relative 19  12 - 46 %   Lymphs Abs 0.9  0.7 -  4.0 K/uL   Monocytes Relative 8  3 - 12 %   Monocytes Absolute 0.3  0.1 - 1.0 K/uL   Eosinophils Relative 1  0 - 5 %   Eosinophils Absolute 0.1  0.0 - 0.7 K/uL   Basophils Relative 0  0 - 1 %   Basophils Absolute 0.0  0.0 - 0.1 K/uL  BASIC METABOLIC PANEL     Status: Abnormal   Collection Time    04/02/14 10:31 AM      Result Value Ref Range   Sodium 140  137 - 147 mEq/L   Potassium 3.5 (*) 3.7 - 5.3 mEq/L   Chloride 103  96 - 112 mEq/L   CO2 25  19 - 32 mEq/L   Glucose, Bld 99  70 - 99 mg/dL   BUN 15  6 - 23 mg/dL   Creatinine, Ser 1.05  0.50 - 1.35 mg/dL   Calcium 8.4  8.4 - 10.5 mg/dL   GFR calc non Af Amer 84 (*) >90 mL/min   GFR calc Af Amer >90  >90 mL/min   Comment: (NOTE)     The eGFR has been calculated using the CKD EPI equation.     This calculation has not been validated in all clinical situations.     eGFR's persistently <90  mL/min signify possible Chronic Kidney     Disease.   Anion gap 12  5 - 15  PRO B NATRIURETIC PEPTIDE     Status: Abnormal   Collection Time    04/02/14 10:31 AM      Result Value Ref Range   Pro B Natriuretic peptide (BNP) 746.5 (*) 0 - 125 pg/mL  TROPONIN I     Status: None   Collection Time    04/02/14 10:31 AM      Result Value Ref Range   Troponin I <0.30  <0.30 ng/mL   Comment:            Due to the release kinetics of cTnI,     a negative result within the first hours     of the onset of symptoms does not rule out     myocardial infarction with certainty.     If myocardial infarction is still suspected,     repeat the test at appropriate intervals.  URINALYSIS, ROUTINE W REFLEX MICROSCOPIC     Status: None   Collection Time    04/02/14  2:22 PM      Result Value Ref Range   Color, Urine YELLOW  YELLOW   APPearance CLEAR  CLEAR   Specific Gravity, Urine 1.010  1.005 - 1.030   pH 6.0  5.0 - 8.0   Glucose, UA NEGATIVE  NEGATIVE mg/dL   Hgb urine dipstick NEGATIVE  NEGATIVE   Bilirubin Urine NEGATIVE  NEGATIVE   Ketones, ur NEGATIVE  NEGATIVE mg/dL   Protein, ur NEGATIVE  NEGATIVE mg/dL   Urobilinogen, UA 1.0  0.0 - 1.0 mg/dL   Nitrite NEGATIVE  NEGATIVE   Leukocytes, UA NEGATIVE  NEGATIVE   Comment: MICROSCOPIC NOT DONE ON URINES WITH NEGATIVE PROTEIN, BLOOD, LEUKOCYTES, NITRITE, OR GLUCOSE <1000 mg/dL.  TROPONIN I     Status: None   Collection Time    04/02/14  3:18 PM      Result Value Ref Range   Troponin I <0.30  <0.30 ng/mL   Comment:            Due to the release kinetics of cTnI,  a negative result within the first hours     of the onset of symptoms does not rule out     myocardial infarction with certainty.     If myocardial infarction is still suspected,     repeat the test at appropriate intervals.  LIPID PANEL     Status: None   Collection Time    04/02/14  3:18 PM      Result Value Ref Range   Cholesterol 154  0 - 200 mg/dL   Triglycerides 61   <150 mg/dL   HDL 44  >39 mg/dL   Total CHOL/HDL Ratio 3.5     VLDL 12  0 - 40 mg/dL   LDL Cholesterol 98  0 - 99 mg/dL   Comment:            Total Cholesterol/HDL:CHD Risk     Coronary Heart Disease Risk Table                         Men   Women      1/2 Average Risk   3.4   3.3      Average Risk       5.0   4.4      2 X Average Risk   9.6   7.1      3 X Average Risk  23.4   11.0                Use the calculated Patient Ratio     above and the CHD Risk Table     to determine the patient's CHD Risk.                ATP III CLASSIFICATION (LDL):      <100     mg/dL   Optimal      100-129  mg/dL   Near or Above                        Optimal      130-159  mg/dL   Borderline      160-189  mg/dL   High      >190     mg/dL   Very High  TROPONIN I     Status: None   Collection Time    04/02/14  9:55 PM      Result Value Ref Range   Troponin I <0.30  <0.30 ng/mL   Comment:            Due to the release kinetics of cTnI,     a negative result within the first hours     of the onset of symptoms does not rule out     myocardial infarction with certainty.     If myocardial infarction is still suspected,     repeat the test at appropriate intervals.  COMPREHENSIVE METABOLIC PANEL     Status: Abnormal   Collection Time    04/03/14  6:09 AM      Result Value Ref Range   Sodium 142  137 - 147 mEq/L   Potassium 3.6 (*) 3.7 - 5.3 mEq/L   Chloride 103  96 - 112 mEq/L   CO2 26  19 - 32 mEq/L   Glucose, Bld 91  70 - 99 mg/dL   BUN 18  6 - 23 mg/dL   Creatinine, Ser 1.17  0.50 - 1.35 mg/dL   Calcium 8.7  8.4 -  10.5 mg/dL   Total Protein 7.3  6.0 - 8.3 g/dL   Albumin 3.9  3.5 - 5.2 g/dL   AST 23  0 - 37 U/L   ALT 19  0 - 53 U/L   Alkaline Phosphatase 48  39 - 117 U/L   Total Bilirubin 1.5 (*) 0.3 - 1.2 mg/dL   GFR calc non Af Amer 74 (*) >90 mL/min   GFR calc Af Amer 85 (*) >90 mL/min   Comment: (NOTE)     The eGFR has been calculated using the CKD EPI equation.     This  calculation has not been validated in all clinical situations.     eGFR's persistently <90 mL/min signify possible Chronic Kidney     Disease.   Anion gap 13  5 - 15  CBC     Status: Abnormal   Collection Time    04/03/14  6:09 AM      Result Value Ref Range   WBC 3.3 (*) 4.0 - 10.5 K/uL   RBC 5.23  4.22 - 5.81 MIL/uL   Hemoglobin 14.9  13.0 - 17.0 g/dL   HCT 44.6  39.0 - 52.0 %   MCV 85.3  78.0 - 100.0 fL   MCH 28.5  26.0 - 34.0 pg   MCHC 33.4  30.0 - 36.0 g/dL   RDW 13.1  11.5 - 15.5 %   Platelets 173  150 - 400 K/uL  INFLUENZA PANEL BY PCR (TYPE A & B, H1N1)     Status: None   Collection Time    04/03/14  8:00 PM      Result Value Ref Range   Influenza A By PCR NEGATIVE  NEGATIVE   Influenza B By PCR NEGATIVE  NEGATIVE   H1N1 flu by pcr NOT DETECTED  NOT DETECTED   Comment:            The Xpert Flu assay (FDA approved for     nasal aspirates or washes and     nasopharyngeal swab specimens), is     intended as an aid in the diagnosis of     influenza and should not be used as     a sole basis for treatment.  CBC     Status: Abnormal   Collection Time    04/04/14  6:48 AM      Result Value Ref Range   WBC 2.7 (*) 4.0 - 10.5 K/uL   RBC 5.55  4.22 - 5.81 MIL/uL   Hemoglobin 15.7  13.0 - 17.0 g/dL   HCT 47.1  39.0 - 52.0 %   MCV 84.9  78.0 - 100.0 fL   MCH 28.3  26.0 - 34.0 pg   MCHC 33.3  30.0 - 36.0 g/dL   RDW 12.9  11.5 - 15.5 %   Platelets 192  150 - 400 K/uL  BASIC METABOLIC PANEL     Status: Abnormal   Collection Time    04/04/14  6:48 AM      Result Value Ref Range   Sodium 143  137 - 147 mEq/L   Potassium 4.3  3.7 - 5.3 mEq/L   Chloride 104  96 - 112 mEq/L   CO2 26  19 - 32 mEq/L   Glucose, Bld 89  70 - 99 mg/dL   BUN 21  6 - 23 mg/dL   Creatinine, Ser 1.14  0.50 - 1.35 mg/dL   Calcium 9.1  8.4 - 10.5 mg/dL   GFR calc  non Af Amer 76 (*) >90 mL/min   GFR calc Af Amer 88 (*) >90 mL/min   Comment: (NOTE)     The eGFR has been calculated using the CKD EPI  equation.     This calculation has not been validated in all clinical situations.     eGFR's persistently <90 mL/min signify possible Chronic Kidney     Disease.   Anion gap 13  5 - 15  MAGNESIUM     Status: None   Collection Time    04/04/14  6:48 AM      Result Value Ref Range   Magnesium 2.3  1.5 - 2.5 mg/dL    Dg Chest 2 View  04/03/2014   CLINICAL DATA:  Fever for 2 days. No chest pain, shortness of breath. Fluid around the heart. Acute CHF, hypertension, hyperlipidemia, atrial fibrillation, asthma.  EXAM: CHEST  2 VIEW  COMPARISON:  04/02/2014  FINDINGS: Cardiac silhouette is enlarged and stable. There changes of pulmonary vascular congestion without overt edema. There is asymmetric density at the right lung base raising the question of early infiltrate.  IMPRESSION: Cardiomegaly and vascular congestion.  Possible right lower lobe infiltrate.   Electronically Signed   By: Shon Hale M.D.   On: 04/03/2014 16:55   Dg Chest 2 View  04/02/2014   CLINICAL DATA:  Shortness of breath.  Cough for 1 week.  EXAM: CHEST  2 VIEW  COMPARISON:  11/27/2012.  FINDINGS: Mild cardiomegaly for projection. Monitoring leads project over the chest. Basilar atelectasis. Suboptimal lateral view because of body habitus and arm position. No pleural effusion. No airspace opacity. Pulmonary vascular congestion is present without alveolar pulmonary edema.  IMPRESSION: Cardiomegaly and pulmonary vascular congestion.   Electronically Signed   By: Dereck Ligas M.D.   On: 04/02/2014 10:59    ROS See HPI Eyes: Negative Ears:Negative for hearing loss, tinnitus Cardiovascular: Negative for chest pain, palpitations,irregular heartbeat, dyspnea, dyspnea on exertion, near-syncope, orthopnea, paroxysmal nocturnal dyspnea and syncope,edema, claudication, cyanosis,.  Respiratory:   Negative for cough, hemoptysis, shortness of breath, sleep disturbances due to breathing, sputum production and wheezing.   Endocrine:  Negative for cold intolerance and heat intolerance.  Hematologic/Lymphatic: Negative for adenopathy and bleeding problem. Does not bruise/bleed easily.  Musculoskeletal: Negative.   Gastrointestinal: Negative for nausea, vomiting, reflux, abdominal pain, diarrhea, constipation.   Genitourinary: Negative for bladder incontinence, dysuria, flank pain, frequency, hematuria, hesitancy, nocturia and urgency.  Neurological: Negative.  Allergic/Immunologic: Negative for environmental allergies.  Blood pressure 151/86, pulse 90, temperature 98.6 F (37 C), temperature source Oral, resp. rate 20, height 6' (1.829 m), weight 352 lb 4.7 oz (159.8 kg), SpO2 94.00%. Physical Exam PHYSICAL EXAM: Obese, in no acute distress. Neck: No JVD, HJR, Bruit, or thyroid enlargement Lungs: Decreased breath sounds but No tachypnea, clear without wheezing, rales, or rhonchi Cardiovascular: Irregular irregular, PMI not displaced, heart sounds distant, no murmurs, gallops, bruit, thrill, or heave. Abdomen: BS normal. Soft without organomegaly, masses, lesions or tenderness. Extremities: without cyanosis, clubbing or edema. Good distal pulses bilateral SKin: Warm, no lesions or rashes  Musculoskeletal: No deformities Neuro: no focal signs  2-D echo 04/03/14 Study Conclusions  - Procedure narrative: Transthoracic echocardiography. Image   quality was poor. The study was technically difficult, as a   result of poor sound wave transmission and body habitus. - Left ventricle: The cavity size was normal. Wall thickness was   increased in a pattern of severe LVH. Systolic function was   normal. The  estimated ejection fraction was in the range of 55%   to 60%. Left ventricular diastolic function parameters were   normal. - Left atrium: The atrium was mildly dilated.  Impressions:  - Would recommend contrast enhancement for a more accurate   assessment of regional wall motion.    Assessment/Plan: Hypertension  uncontrolled due to medical noncompliance on metoprolol 104m BID, Cardizem 340mq 6.  Acute on chronic diastolic heart failure probably secondary to uncontrolled hypertension-received Lasix 403mID. EF 55-60% on echo, severe LVH.  Atrial fibrillation which is new for him CHA2DS2-VASc=2 but patient noncompliant so not good candidate for long term anticoagulation.  Nonsustained Ventricular Tachycardia today- potassium was 3.6 yesterday, now 4.3.  Morbid Obesity  OSA  Viral URI  MicErmalinda Barrios/14/2015, 10:29 AM

## 2014-04-04 NOTE — Discharge Summary (Signed)
Patient seen, independently examined and chart reviewed. I agree with exam, assessment and plan discussed with Toya Smothers, NP.  47yom presented with 3 day hx URI symptoms as well as chronic LE edema. Admitted for suspected acute diasolic CHF.   PMH Hypertensive heart disease PUD, GERD HTN Tobacco dependence in remission Morbid obesity OSA non-compliant with CPAP Non-compliance  Subjective/interval history: Patient refused CPAP. Had NSVT this AM.  Patient feels well, no pain, no SOB. Wants to go home.  Objective: afebrile, VSS, no hypoxia  Gen. Appears calm, comfortable  Psych. Alert, speech fluent and clear  Cardiovascular. Afib, no m/r/g. No LE edema. 11 beat NSVT.  Respiratory. CTA bilaterally no w/r/r. Normal resp effort.   BMP, magnesium unremarkable  CBC stable with mild leukopenia  Influenza screen was negative  EF 55-60% on echo, severe LVH  Appears clinically stable, no chest pain or SOB. Plan as below.  Overall much improved, cardiology consultation appreciated, discussed with Dr. Purvis Sheffield, clear for discharge today. He will arrange for outpatient follow-up for patient for NSVT. Recommends continue diltiazem 120mg  daily, metoprolol 50mg  BID, start Lasix 20mg  daily. Add low dose potassium supplementation (normal kidney function, has required supplementation here).  Acute on chronic diastolic CHF stable New dx Afib, noncompliant therefore no anticoagulation, stable on rate controle NSVT with normal potassium/magnesium, consider outpatient stress testing to evaluate for ischemic etiology--cardiology will arrange. HTN stable. Possible CAP, no hypoxia--finish abx Morbid obesity TSH slightly low--suggest outpatient follow-up, check T3, T4 as outpatient. Patient wants to go home so will defer at this time.  Brendia Sacks, MD [facility] [phone]

## 2014-04-04 NOTE — Consult Note (Addendum)
The patient was seen and examined, and I agree with the assessment and plan as documented above, with modifications as noted below. Pt with long h/o treatment noncompliance (see prior notes by Dr. Dietrich Pates) and uncontrolled hypertension admitted with pulmonary vascular congestion and what appears to be acute on chronic diastolic heart failure. CXR on 10/13 demonstrated possible RLL infiltrate and is on levofloxacin. Influenza PCR negative. Developed rapid atrial fibrillation and is on both metoprolol and short-acting diltiazem. Noted to have NSVT. LV systolic function is normal with severe LVH. At present, patient denies chest pain and says breathing has improved. York Spaniel he has had an "irregular heart beat for a long time". Denies bleeding problems. Said he was supposed to see Dr. Juanetta Gosling to establish primary care but never did. Says he thinks he can start seeing him and take meds appropriately.  RECS: Unfortunately, given his long h/o treatment noncompliance, I am doubtful that things will change in spite of what he says. In order to simplify his medication regimen, can switch from short-acting diltiazem to 120 mg long-acting daily for HR control, which may also help to control BP. Continue metoprolol 50 mg bid.  Given severe LVH, would consider addition of ACEI (known to potentially lead to regression in some instances), but doubtful that he will take it. Has reportedly had a modest diuretic response on Lasix 40 mg IV bid. This can likely be transitioned to oral in the near future. Given 11-beat run of NSVT in spite of normal potassium and magnesium levels, would consider outpatient stress testing to evaluate for an ischemic etiology, albeit he denies chest pain altogether. I do not think he is an appropriate candidate for anticoagulation given his noncompliance. This could be reconsidered if he proves himself to be compliant in the future.

## 2014-04-04 NOTE — Discharge Summary (Signed)
Physician Discharge Summary  SAMAAD HASHEM ZOX:[id number] DOB: July 09, 1965 DOA: 04/02/2014  PCP: No PCP Per Patient  Admit date: 04/02/2014 Discharge date: 04/04/2014  Time spent: 40 minutes  Recommendations for Outpatient Follow-up:  1. Dr Sharene Skeans office to contact for follow up to evaluate CHF, BP control as medications adjusted this hospitalization 2. Dr. Juanetta Gosling' PCP. Patient reports plans to establish with Dr Juanetta Gosling. Consider pulmonary function test  Discharge Diagnoses:  Principal Problem:   CHF (congestive heart failure) Active Problems:   OBESITY, MORBID   Anxiety state   Essential hypertension   Asthma   GERD   Sleep apnea   A-fib   Discharge Condition: stable  Diet recommendation: heart healthy  Filed Weights   04/02/14 1617 04/03/14 0558 04/04/14 0200  Weight: 164.202 kg (362 lb) 160.1 kg (352 lb 15.3 oz) 159.8 kg (352 lb 4.7 oz)    History of present illness:  [name] is a 48 y.o. male with a past medical history that includes hypertension, hyperlipidemia, GERD, peptic ulcer disease, morbid obesity, tobacco use, CHF presented to the emergency department on 10/12/5 with chief complaint of worsening shortness of breath. Initial evaluation in the emergency department concerning for acute on chronic CHF.  Patient reported that 3 days prior he felt like he was starting with a "cold". He said he had nasal congestion and postnasal drip and frequent coughing. This worsened and moved to his chest. He developed shortness of breath 2 days prior. The shortness of breath and cough persisted and worsened. He stateed that his cough became productive with thick yellow sputum. Associated symptoms included chills subjective fever decreased appetite generalized weakness. He denied any chest pain palpitations orthopnea or worsening lower extremity edema. He admited to noncompliance in the past with his medications and followup with his care providers. He stated it had been at  least one year since he's been seen.  Workup in the emergency department included basic metabolic panel significant for potassium of 3.5, complete blood count was unremarkable, initial troponin negative, proBNP 746.5, urinalysis was negative, chest x-ray with cardiomegaly and vascular congestion, EKG with no acute changes.   In the emergency department he had a temperature of 100.9 was hypertensive with a blood pressure 125/104 heart rate 90 respiratory 18 sats 99% on room air.  He was given nebs and 40 mg of Lasix IV in the emergency department. He stated that he was already feeling better.   Hospital Course:  Principal Problem:  CHF (congestive heart failure): Acute on chronic likely diastolic. Likely related to uncontrolled hypertension as patient has been noncompliant with medication and followup care for the last year and a half. Provided with IV lasix and had fair diuresis. Echocardiogram with EF 55- 60%% and severe LVH. Troponin neg x3. Repeat EKG with afib RVR rate of 102. Evaluated by cardiology who recommend long acting diltiazem with 50mg  metoprolol and lisinopril. Lasix also recommended. Dr Sharene Skeans office will contact for follow up   Active Problems:  NSVT: on tele the am of discharge. No complaints of chest pain/palpitations. Potassium and magnesium within the limits of normal. Evaluated by cardiology who will contact him for follow up appointment to discuss OP stress test.  Afib with RVR: likely related to fever. Rate control improved. Cardiology opine not candidate for anticoagulation due to hx non-compliance. Will discharge with diltiazem and metoprolol. Follow up with cardiology. They will contact   Fever: etiology unclear but suspect viral respiratory infection. Repeat chest xray 10/13 with possible RLL. Flu  PCP negative. U/A unremarkable. Levaquin started. At discharge he is afebrile and non-toxic appearing   Essential hypertension: Improved control. See #1.   Asthma:  remained stable at baseline. Oxygen saturation level >90 on room air. Recommend follow up OP pulmonary function test  OBESITY, MORBID: BMI 48.8.  Anxiety state: Remained stable at baseline.  GERD: Stable at baseline.   Sleep apnea: refused CPAP during hospitalization     Procedures:  echo on 04/03/14 The cavity size was normal. Wall thickness was increased in a pattern of severe LVH. Systolic function was normal. The estimated ejection fraction was in the range of 55% to 60%. Left ventricular diastolic function parameters were normal.     Consultations:  Dr Darl Householder cardiology  Discharge Exam: Filed Vitals:   04/04/14 1433  BP:   Pulse: 125  Temp:   Resp:     General: obese ambulating in room with steady gait Cardiovascular: irregularly irregular no m/g/r no LE edema Respiratory: normal effort BS diminished throughout. No crackles or wheeze  Discharge Instructions You were cared for by a hospitalist during your hospital stay. If you have any questions about your discharge medications or the care you received while you were in the hospital after you are discharged, you can call the unit and asked to speak with the hospitalist on call if the hospitalist that took care of you is not available. Once you are discharged, your primary care physician will handle any further medical issues. Please note that NO REFILLS for any discharge medications will be authorized once you are discharged, as it is imperative that you return to your primary care physician (or establish a relationship with a primary care physician if you do not have one) for your aftercare needs so that they can reassess your need for medications and monitor your lab values.  Discharge Instructions   Diet - low sodium heart healthy    Complete by:  As directed      Discharge instructions    Complete by:  As directed   Take medications as directed Dr Sharene Skeans office will contact you with appointment      Increase activity slowly    Complete by:  As directed           Current Discharge Medication List    START taking these medications   Details  diltiazem (CARDIZEM CD) 120 MG 24 hr capsule Take 1 capsule (120 mg total) by mouth daily. Qty: 30 capsule, Refills: 0    furosemide (LASIX) 20 MG tablet Take 1 tablet (20 mg total) by mouth daily. Qty: 30 tablet, Refills: 0    levofloxacin (LEVAQUIN) 500 MG tablet Take 1 tablet (500 mg total) by mouth at bedtime. Qty: 3 tablet, Refills: 0    lisinopril (PRINIVIL,ZESTRIL) 10 MG tablet Take 1 tablet (10 mg total) by mouth daily. Qty: 30 tablet, Refills: 0    metoprolol (LOPRESSOR) 50 MG tablet Take 1 tablet (50 mg total) by mouth 2 (two) times daily. Qty: 60 tablet, Refills: 0    potassium chloride SA (K-DUR,KLOR-CON) 20 MEQ tablet Take 1 tablet (20 mEq total) by mouth daily. Qty: 30 tablet, Refills: 0      CONTINUE these medications which have CHANGED   Details  albuterol-ipratropium (COMBIVENT) 18-103 MCG/ACT inhaler Inhale 2 puffs into the lungs every 6 (six) hours as needed for wheezing or shortness of breath. Qty: 1 Inhaler, Refills: o    beclomethasone (QVAR) 80 MCG/ACT inhaler Inhale 1 puff into the lungs as needed (shortness  of breath). Qty: 1 Inhaler, Refills: 12       Allergies  Allergen Reactions  . Bee Venom Shortness Of Breath and Swelling  . Aspirin Other (See Comments)    Chewable 81 mg tablets upsets patients stomach   Follow-up Information   Follow up with HAWKINS,EDWARD L, MD. Schedule an appointment as soon as possible for a visit in 1 week. (follow up 1-2 weeks for evaluation of COPD. may benefit from pulmonary function tests)    Specialty:  Pulmonary Disease   Contact information:   [address] [place] [address] [phone]       Follow up with [name], MD. (office will contact you for follow up appointment)    Specialty:  Cardiology   Contact information:   [address] [place] [address] [phone]        The results of significant diagnostics from this hospitalization (including imaging, microbiology, ancillary and laboratory) are listed below for reference.    Significant Diagnostic Studies: Dg Chest 2 View  04/03/2014   CLINICAL DATA:  Fever for 2 days. No chest pain, shortness of breath. Fluid around the heart. Acute CHF, hypertension, hyperlipidemia, atrial fibrillation, asthma.  EXAM: CHEST  2 VIEW  COMPARISON:  04/02/2014  FINDINGS: Cardiac silhouette is enlarged and stable. There changes of pulmonary vascular congestion without overt edema. There is asymmetric density at the right lung base raising the question of early infiltrate.  IMPRESSION: Cardiomegaly and vascular congestion.  Possible right lower lobe infiltrate.   Electronically Signed   By: Rosalie Gums M.D.   On: 04/03/2014 16:55   Dg Chest 2 View  04/02/2014   CLINICAL DATA:  Shortness of breath.  Cough for 1 week.  EXAM: CHEST  2 VIEW  COMPARISON:  11/27/2012.  FINDINGS: Mild cardiomegaly for projection. Monitoring leads project over the chest. Basilar atelectasis. Suboptimal lateral view because of body habitus and arm position. No pleural effusion. No airspace opacity. Pulmonary vascular congestion is present without alveolar pulmonary edema.  IMPRESSION: Cardiomegaly and pulmonary vascular congestion.   Electronically Signed   By: Andreas Newport M.D.   On: 04/02/2014 10:59    Microbiology: No results found for this or any previous visit (from the past 240 hour(s)).   Labs: Basic Metabolic Panel:  Recent Labs Lab 04/02/14 1031 04/03/14 0609 04/04/14 0648  NA 140 142 143  K 3.5* 3.6* 4.3  CL 103 103 104  CO2 25 26 26   GLUCOSE 99 91 89  BUN 15 18 21   CREATININE 1.05 1.17 1.14  CALCIUM 8.4 8.7 9.1  MG  --   --  2.3   Liver Function Tests:  Recent Labs Lab 04/03/14 0609  AST 23  ALT 19  ALKPHOS 48  BILITOT 1.5*  PROT 7.3  ALBUMIN 3.9   No results  found for this basename: LIPASE, AMYLASE,  in the last 168 hours No results found for this basename: AMMONIA,  in the last 168 hours CBC:  Recent Labs Lab 04/02/14 1031 04/03/14 0609 04/04/14 0648  WBC 4.5 3.3* 2.7*  NEUTROABS 3.2  --   --   HGB 14.6 14.9 15.7  HCT 42.5 44.6 47.1  MCV 84.2 85.3 84.9  PLT 174 173 192   Cardiac Enzymes:  Recent Labs Lab 04/02/14 1031 04/02/14 1518 04/02/14 2155  TROPONINI <0.30 <0.30 <0.30   BNP: BNP (last 3 results)  Recent Labs  04/02/14 1031  PROBNP 746.5*   CBG: No results  found for this basename: GLUCAP,  in the last 168 hours     Signed:  Gwenyth BenderBLACK,Nevea Spiewak M  [facility] 04/04/2014, 3:10 PM

## 2014-04-17 ENCOUNTER — Other Ambulatory Visit (HOSPITAL_COMMUNITY): Payer: Self-pay | Admitting: Respiratory Therapy

## 2014-04-17 DIAGNOSIS — R0602 Shortness of breath: Secondary | ICD-10-CM

## 2014-04-26 ENCOUNTER — Encounter: Payer: Medicare Other | Admitting: Adult Health

## 2014-05-02 NOTE — Progress Notes (Signed)
    Error-Appt Cancelled  

## 2014-05-03 ENCOUNTER — Encounter: Payer: Medicare Other | Admitting: Adult Health

## 2014-05-04 NOTE — Progress Notes (Signed)
HPI: [name] is a 48 year old male patient of Dr. Purvis Sheffield, that we are following for ongoing assessment and management of atrial fibrillation, hypertension, diastolic heart failure, with echo completed on 04/03/2014 demonstrated an EF of 55-60%. The patient was seen on consultation on 04/04/2014 in the setting of hypertensive heart disease. Heart failure, and A. Fib RVR.   The patient has a history of medical noncompliance with medications. He was ruled out for myocardial infarction. He was placed on long-acting diltiazem guiding 20 mg daily, diuresis, with home dose of Lasix 20 mg daily, continued on metoprolol 50 mg twice a day and lisinopril 10 mg for blood pressure control, along with the calcium channel blocker. He was to followup with pulmonology to have PFT completed.  He comes today having not taken his antihypertensive medications. He is also complaining of his HR being too slow on metoprolol feeling weak. He also is having stomach [pain and diarrhea since leaving the hospital, but then states that he has to defecate after about 10 minutes of eating a meal. He has lost 22 lbs since Oct 2015. "Just not hungry."    Allergies  Allergen Reactions  . Bee Venom Shortness Of Breath and Swelling  . Aspirin Other (See Comments)    Chewable 81 mg tablets upsets patients stomach    Current Outpatient Prescriptions  Medication Sig Dispense Refill  . albuterol-ipratropium (COMBIVENT) 18-103 MCG/ACT inhaler Inhale 2 puffs into the lungs every 6 (six) hours as needed for wheezing or shortness of breath. 1 Inhaler o  . beclomethasone (QVAR) 80 MCG/ACT inhaler Inhale 1 puff into the lungs as needed (shortness of breath). 1 Inhaler 12  . diltiazem (CARDIZEM CD) 120 MG 24 hr capsule Take 1 capsule (120 mg total) by mouth daily. 30 capsule 0  . furosemide (LASIX) 20 MG tablet Take 1 tablet (20 mg total) by mouth daily. 30 tablet 0  . lisinopril (PRINIVIL,ZESTRIL) 10 MG tablet Take 1 tablet (10  mg total) by mouth 2 (two) times daily. 30 tablet 3  . potassium chloride SA (K-DUR,KLOR-CON) 20 MEQ tablet Take 1 tablet (20 mEq total) by mouth daily. 30 tablet 0  . COMBIVENT RESPIMAT 20-100 MCG/ACT AERS respimat   0  . metoprolol succinate (TOPROL XL) 25 MG 24 hr tablet Take 1 tab at bedtime 30 tablet 3   No current facility-administered medications for this visit.    Past Medical History  Diagnosis Date  . Hypertensive heart disease     Hypertensive heart disease with prior episodes of CHF  . Hyperlipidemia   . Hypertension   . Gastroesophageal reflux disease   . Osteoarthritis   . Peptic ulcer disease   . Morbid obesity     /sleep apnea  . Allergic rhinitis     plus h/o asthma  . Tobacco abuse, in remission     Cigarettes discontinued in 2010  . CHF (congestive heart failure)   . A-fib     2015    Past Surgical History  Procedure Laterality Date  . None      ROS: Review of systems complete and found to be negative unless listed above  PHYSICAL EXAM BP 204/110 mmHg  Pulse 96  Ht 6' (1.829 m)  Wt 324 lb (146.965 kg)  BMI 43.93 kg/m2 General: Well developed, well nourished, in no acute distress, morbidly obese. Head: Eyes PERRLA, No xanthomas.   Normal cephalic and atramatic  Lungs: Clear bilaterally to auscultation and percussion. Heart: HRRR S1 S2, frequent extra  systole, without MRG.  Pulses are 2+ & equal.            No carotid bruit. No JVD.  No abdominal bruits. No femoral bruits. Abdomen: Bowel sounds are positive, abdomen soft and non-tender without masses or                  Hernia's noted. Msk:  Back normal, normal gait. Normal strength and tone for age. Extremities: No clubbing, cyanosis 2+ pitting edema.  DP +1 Neuro: Alert and oriented X 3. Psych:  Good affect, responds appropriately   ASSESSMENT AND PLAN

## 2014-05-07 ENCOUNTER — Encounter: Payer: Self-pay | Admitting: Adult Health

## 2014-05-07 ENCOUNTER — Ambulatory Visit (INDEPENDENT_AMBULATORY_CARE_PROVIDER_SITE_OTHER): Payer: Medicare Other | Admitting: Adult Health

## 2014-05-07 VITALS — BP 204/110 | HR 96 | Ht 72.0 in | Wt 324.0 lb

## 2014-05-07 DIAGNOSIS — K589 Irritable bowel syndrome without diarrhea: Secondary | ICD-10-CM

## 2014-05-07 DIAGNOSIS — K279 Peptic ulcer, site unspecified, unspecified as acute or chronic, without hemorrhage or perforation: Secondary | ICD-10-CM

## 2014-05-07 DIAGNOSIS — I10 Essential (primary) hypertension: Secondary | ICD-10-CM

## 2014-05-07 MED ORDER — LISINOPRIL 10 MG PO TABS: 10.0000 mg | ORAL_TABLET | Freq: Two times a day (BID) | ORAL | Status: DC

## 2014-05-07 MED ORDER — METOPROLOL SUCCINATE ER 25 MG PO TB24: ORAL_TABLET | ORAL | Status: DC

## 2014-05-07 NOTE — Assessment & Plan Note (Signed)
Some evidence of LEE. May be related to diltiazem. Lungs are clear. WT is down 22 lbs since hospitalization. Follow up in one month.

## 2014-05-07 NOTE — Assessment & Plan Note (Signed)
He continues to be non-compliant with medications. Does not take every day, and did not take today. He states it makes him feel weak and tired. Not sure if he has OSA, but certainly had body habitus for thiDue his complaints of fatigue and slow HR, I will decrease metoprolol to Toprol XL 25 mg at HS. I will increase lisinopril to 10 mg BID. Will leave him on diltiazem 120 mg in am with lasix.   He is to follow up in the [place] office as this is closer for him, and what he requests. Will need follow up BMET on next visit with Dr. Purvis Sheffield.

## 2014-05-07 NOTE — Patient Instructions (Addendum)
Your physician recommends that you schedule a follow-up appointment in: 2 weeks with Dr. Purvis Sheffield in [facility]  Your physician has recommended you make the following change in your medication:   TAKE LISINOPRIL 10 MG TWICE DAILY  TAKE METOPROLOL 25 MG ONCE DAILY AT BEDTIME  WE HAVE REFERRED YOU TO GI IN [place]  Thank you for choosing [facility]!!

## 2014-05-07 NOTE — Assessment & Plan Note (Signed)
He is recommended to see GI specialist to continue treatment. He had been on PPI, but has not been seen by GI, he states. I have given him the names of local GI physicians in [place], He lives in [place] and names of GI specialists there will be provided. He will follow up on his own,.

## 2014-05-07 NOTE — Progress Notes (Deleted)
Name: [name]    DOB: Feb 13, 1966  Age: 48 y.o.  MR#: [id number]       PCP:  No PCP Per Patient      Insurance: Payor: [employer] [place] MEDICARE / Plan: BLUE MEDICARE / Product Type: *No Product type* /   CC:    Chief Complaint  Patient presents with  . Congestive Heart Failure  . Atrial Fibrillation    VS Filed Vitals:   05/07/14 1528  BP: 204/110  Pulse: 96  Height: 6' (1.829 m)  Weight: 324 lb (146.965 kg)    Weights Current Weight  05/07/14 324 lb (146.965 kg)  04/04/14 352 lb 4.7 oz (159.8 kg)  11/27/12 345 lb (156.491 kg)    Blood Pressure  BP Readings from Last 3 Encounters:  05/07/14 204/110  04/04/14 131/109  11/27/12 149/92     Admit date:  (Not on file) Last encounter with RMR:  Visit date not found   Allergy Bee venom and Aspirin  Current Outpatient Prescriptions  Medication Sig Dispense Refill  . albuterol-ipratropium (COMBIVENT) 18-103 MCG/ACT inhaler Inhale 2 puffs into the lungs every 6 (six) hours as needed for wheezing or shortness of breath. 1 Inhaler o  . beclomethasone (QVAR) 80 MCG/ACT inhaler Inhale 1 puff into the lungs as needed (shortness of breath). 1 Inhaler 12  . diltiazem (CARDIZEM CD) 120 MG 24 hr capsule Take 1 capsule (120 mg total) by mouth daily. 30 capsule 0  . furosemide (LASIX) 20 MG tablet Take 1 tablet (20 mg total) by mouth daily. 30 tablet 0  . lisinopril (PRINIVIL,ZESTRIL) 10 MG tablet Take 1 tablet (10 mg total) by mouth daily. 30 tablet 0  . potassium chloride SA (K-DUR,KLOR-CON) 20 MEQ tablet Take 1 tablet (20 mEq total) by mouth daily. 30 tablet 0  . COMBIVENT RESPIMAT 20-100 MCG/ACT AERS respimat   0  . metoprolol (LOPRESSOR) 50 MG tablet Take 1 tablet (50 mg total) by mouth 2 (two) times daily. 60 tablet 0   No current facility-administered medications for this visit.    Discontinued Meds:    Medications Discontinued During This Encounter  Medication Reason  . levofloxacin (LEVAQUIN) 500 MG tablet  Error    Patient Active Problem List   Diagnosis Date Noted  . CAP (community acquired pneumonia) 04/04/2014  . A-fib 04/03/2014  . CHF (congestive heart failure) 04/02/2014  . Asthma 02/11/2009  . Sleep apnea 02/11/2009  . HYPERLIPIDEMIA 07/12/2008  . OBESITY, MORBID 07/12/2008  . Anxiety state 07/12/2008  . DEPRESSION 07/12/2008  . Essential hypertension 07/12/2008  . CONGESTIVE HEART FAILURE 07/12/2008  . ALLERGIC RHINITIS 07/12/2008  . GERD 07/12/2008  . PEPTIC ULCER DISEASE 07/12/2008  . OSTEOARTHRITIS 07/12/2008    LABS    Component Value Date/Time   NA 143 04/04/2014 0648   NA 142 04/03/2014 0609   NA 140 04/02/2014 1031   K 4.3 04/04/2014 0648   K 3.6* 04/03/2014 0609   K 3.5* 04/02/2014 1031   CL 104 04/04/2014 0648   CL 103 04/03/2014 0609   CL 103 04/02/2014 1031   CO2 26 04/04/2014 0648   CO2 26 04/03/2014 0609   CO2 25 04/02/2014 1031   GLUCOSE 89 04/04/2014 0648   GLUCOSE 91 04/03/2014 0609   GLUCOSE 99 04/02/2014 1031   BUN 21 04/04/2014 0648   BUN 18 04/03/2014 0609   BUN 15 04/02/2014 1031   CREATININE 1.14 04/04/2014 0648   CREATININE 1.17 04/03/2014 0609   CREATININE  1.05 04/02/2014 1031   CREATININE 1.02 07/07/2011 0909   CREATININE 1.00 01/05/2011 0840   CALCIUM 9.1 04/04/2014 0648   CALCIUM 8.7 04/03/2014 0609   CALCIUM 8.4 04/02/2014 1031   GFRNONAA 76* 04/04/2014 0648   GFRNONAA 74* 04/03/2014 0609   GFRNONAA 84* 04/02/2014 1031   GFRAA 88* 04/04/2014 0648   GFRAA 85* 04/03/2014 0609   GFRAA >90 04/02/2014 1031   CMP     Component Value Date/Time   NA 143 04/04/2014 0648   K 4.3 04/04/2014 0648   CL 104 04/04/2014 0648   CO2 26 04/04/2014 0648   GLUCOSE 89 04/04/2014 0648   BUN 21 04/04/2014 0648   CREATININE 1.14 04/04/2014 0648   CREATININE 1.02 07/07/2011 0909   CALCIUM 9.1 04/04/2014 0648   PROT 7.3 04/03/2014 0609   ALBUMIN 3.9 04/03/2014 0609   AST 23 04/03/2014 0609   ALT 19 04/03/2014 0609   ALKPHOS 48  04/03/2014 0609   BILITOT 1.5* 04/03/2014 0609   GFRNONAA 76* 04/04/2014 0648   GFRAA 88* 04/04/2014 0648       Component Value Date/Time   WBC 2.7* 04/04/2014 0648   WBC 3.3* 04/03/2014 0609   WBC 4.5 04/02/2014 1031   HGB 15.7 04/04/2014 0648   HGB 14.9 04/03/2014 0609   HGB 14.6 04/02/2014 1031   HCT 47.1 04/04/2014 0648   HCT 44.6 04/03/2014 0609   HCT 42.5 04/02/2014 1031   MCV 84.9 04/04/2014 0648   MCV 85.3 04/03/2014 0609   MCV 84.2 04/02/2014 1031    Lipid Panel     Component Value Date/Time   CHOL 154 04/02/2014 1518   TRIG 61 04/02/2014 1518   HDL 44 04/02/2014 1518   CHOLHDL 3.5 04/02/2014 1518   VLDL 12 04/02/2014 1518   LDLCALC 98 04/02/2014 1518    ABG No results found for: PHART, PCO2ART, PO2ART, HCO3, TCO2, ACIDBASEDEF, O2SAT   Lab Results  Component Value Date   TSH 0.322* 04/02/2014   BNP (last 3 results)  Recent Labs  04/02/14 1031  PROBNP 746.5*   Cardiac Panel (last 3 results) No results for input(s): CKTOTAL, CKMB, TROPONINI, RELINDX in the last 72 hours.  Iron/TIBC/Ferritin/ %Sat No results found for: IRON, TIBC, FERRITIN, IRONPCTSAT   EKG Orders placed or performed during the hospital encounter of 04/02/14  . EKG 12-Lead  . EKG 12-Lead  . EKG 12-Lead  . EKG 12-Lead  . EKG 12-Lead  . EKG 12-Lead  . EKG     Prior Assessment and Plan Problem List as of 05/07/2014      Cardiovascular and Mediastinum   Essential hypertension   Last Assessment & Plan   12/10/2011 Office Visit Edited 12/15/2011  7:21 AM by Kathlen Brunswick, MD    Patient has long-standing severe hypertension with a previous episode of congestive heart failure, but no recent symptomatic manifestations of his disease.  He has been informed of the serious morbidity that can result from uncontrolled hypertension.  Since he has such difficulty with medical therapy, I raised the issue of radiofrequency ablation of the sympathetic renal nerves.  He was very fearful of  that procedure as described and would not consider it.  I advised patient that we are not accomplishing much in this relationship and that we will need to terminate it if he continues to fail to comply with his medical regime.  For him, that involves taking medication on a daily basis and reporting any adverse effects to [place].  All of  his current medication except lisinopril/HCT will be discontinued.  Labetalol 200 mg twice a day will be started.  Patient is advised to monitor pressure at home and will bring a list in one month for review.  I will see him again as scheduled in approximately 2 months.  Counseling occupied more than 50% of this 30 minute visit.    CONGESTIVE HEART FAILURE   Last Assessment & Plan   05/06/2011 Office Visit Written 05/06/2011  1:04 PM by Kathlen Brunswickobert M Rothbart, MD    No current signs or symptoms of congestive heart failure.    CHF (congestive heart failure)   A-fib     Respiratory   ALLERGIC RHINITIS   Asthma   Last Assessment & Plan   11/28/2010 Office Visit Written 11/28/2010  3:49 PM by Kathlen Brunswickobert M Rothbart, MD    Patient has required an inhaler for treatment of an exacerbation of his asthma in recent months.  This is another reason to discontinue beta blocker treatment.    CAP (community acquired pneumonia)     Digestive   GERD   PEPTIC ULCER DISEASE     Musculoskeletal and Integument   OSTEOARTHRITIS     Other   HYPERLIPIDEMIA   Last Assessment & Plan   11/28/2010 Office Visit Written 11/28/2010  3:47 PM by Kathlen Brunswickobert M Rothbart, MD    Most recent lipid profile in 2010 was somewhat suboptimal, but adequate in light of the patient's absence of known vascular disease.  A repeat study will be obtained.    OBESITY, MORBID   Last Assessment & Plan   05/06/2011 Office Visit Written 05/06/2011  1:09 PM by Kathlen Brunswickobert M Rothbart, MD    Patient congratulated on 6 pound weight loss and encouraged to continue to restrict calories and to be as active as possible.    Anxiety state    DEPRESSION   Sleep apnea   Last Assessment & Plan   10/08/2011 Office Visit Written 10/08/2011  2:39 PM by Kathlen Brunswickobert M Rothbart, MD    Patient would likely benefit from a CPAP device, but does not appear motivated to obtain one.        Imaging: No results found.

## 2014-05-09 ENCOUNTER — Ambulatory Visit (HOSPITAL_COMMUNITY): Payer: Medicare Other | Attending: Pulmonary Disease

## 2014-05-10 ENCOUNTER — Encounter: Payer: Self-pay | Admitting: Internal Medicine

## 2014-05-23 ENCOUNTER — Ambulatory Visit: Payer: Medicare Other | Admitting: Cardiovascular Disease

## 2014-05-25 ENCOUNTER — Encounter: Payer: Self-pay | Admitting: Cardiovascular Disease

## 2014-05-25 ENCOUNTER — Ambulatory Visit (INDEPENDENT_AMBULATORY_CARE_PROVIDER_SITE_OTHER): Payer: Medicare Other | Admitting: Cardiovascular Disease

## 2014-05-25 VITALS — BP 158/108 | HR 101 | Ht 72.0 in | Wt 324.0 lb

## 2014-05-25 DIAGNOSIS — I4891 Unspecified atrial fibrillation: Secondary | ICD-10-CM

## 2014-05-25 DIAGNOSIS — I4729 Other ventricular tachycardia: Secondary | ICD-10-CM

## 2014-05-25 DIAGNOSIS — I10 Essential (primary) hypertension: Secondary | ICD-10-CM

## 2014-05-25 DIAGNOSIS — I5032 Chronic diastolic (congestive) heart failure: Secondary | ICD-10-CM

## 2014-05-25 DIAGNOSIS — Z9114 Patient's other noncompliance with medication regimen: Secondary | ICD-10-CM

## 2014-05-25 DIAGNOSIS — I472 Ventricular tachycardia: Secondary | ICD-10-CM

## 2014-05-25 MED ORDER — METOPROLOL SUCCINATE ER 50 MG PO TB24
50.0000 mg | ORAL_TABLET | Freq: Every day | ORAL | Status: DC
Start: 2014-05-25 — End: 2014-06-28

## 2014-05-25 MED ORDER — LISINOPRIL 40 MG PO TABS: 40.0000 mg | ORAL_TABLET | Freq: Every day | ORAL | Status: DC

## 2014-05-25 NOTE — Addendum Note (Signed)
Addended by: Lesle Chris on: 05/25/2014 03:53 PM   Modules accepted: Orders, Level of Service

## 2014-05-25 NOTE — Progress Notes (Signed)
Patient ID: Samuel KlippelMark T Fitzpatrick, male   DOB: 05/13/1966, 48 y.o.   MRN: [id number]      SUBJECTIVE: The patient presents for follow up of atrial fibrillation, chronic diastolic heart failure, HTN, and nonsustained VT. I saw him in consultation during a recent hospitalization. He has a long history of treatment noncompliance. He has normal left ventricular systolic function and severe LVH. When he saw K. Lawrence NP in mid-November, he was again noncompliant with his antihypertensive regimen. He says he is taking his medications. He denies chest pain but does have shortness of breath alleviated when using inhalers. He has chronic leg swelling. He complains of having to use the bathroom 20 minutes after eating.    Review of Systems: As per "subjective", otherwise negative.  Allergies  Allergen Reactions  . Bee Venom Shortness Of Breath and Swelling  . Aspirin Other (See Comments)    Chewable 81 mg tablets upsets patients stomach    Current Outpatient Prescriptions  Medication Sig Dispense Refill  . albuterol-ipratropium (COMBIVENT) 18-103 MCG/ACT inhaler Inhale 2 puffs into the lungs every 6 (six) hours as needed for wheezing or shortness of breath. 1 Inhaler o  . beclomethasone (QVAR) 80 MCG/ACT inhaler Inhale 1 puff into the lungs as needed (shortness of breath). 1 Inhaler 12  . COMBIVENT RESPIMAT 20-100 MCG/ACT AERS respimat   0  . diltiazem (CARDIZEM CD) 120 MG 24 hr capsule Take 1 capsule (120 mg total) by mouth daily. 30 capsule 0  . furosemide (LASIX) 20 MG tablet Take 1 tablet (20 mg total) by mouth daily. 30 tablet 0  . lisinopril (PRINIVIL,ZESTRIL) 10 MG tablet Take 1 tablet (10 mg total) by mouth 2 (two) times daily. 30 tablet 3  . metoprolol succinate (TOPROL XL) 25 MG 24 hr tablet Take 1 tab at bedtime 30 tablet 3  . potassium chloride SA (K-DUR,KLOR-CON) 20 MEQ tablet Take 1 tablet (20 mEq total) by mouth daily. 30 tablet 0   No current facility-administered medications for  this visit.    Past Medical History  Diagnosis Date  . Hypertensive heart disease     Hypertensive heart disease with prior episodes of CHF  . Hyperlipidemia   . Hypertension   . Gastroesophageal reflux disease   . Osteoarthritis   . Peptic ulcer disease   . Morbid obesity     /sleep apnea  . Allergic rhinitis     plus h/o asthma  . Tobacco abuse, in remission     Cigarettes discontinued in 2010  . CHF (congestive heart failure)   . A-fib     2015    Past Surgical History  Procedure Laterality Date  . None      History   Social History  . Marital Status: Divorced    Spouse Name: N/A    Number of Children: 3  . Years of Education: N/A   Occupational History  . disabled    Social History Main Topics  . Smoking status: Former Smoker -- 0.50 packs/day for 20 years    Types: Cigarettes    Start date: 04/26/1988    Quit date: 06/23/2007  . Smokeless tobacco: Never Used     Comment: 1 ppd former smoker  . Alcohol Use: No  . Drug Use: No  . Sexual Activity: No   Other Topics Concern  . Not on file   Social History Narrative     Filed Vitals:   05/25/14 1447 05/25/14 1459  BP: 165/94 158/108  Pulse: 98 101  Height: 6' (1.829 m)   Weight: 324 lb (146.965 kg)   SpO2: 98% 98%    PHYSICAL EXAM General: NAD, morbidly obese. HEENT: Wearing sunglasses. Neck: No JVD, no thyromegaly. Lungs: Diminished sounds but no wheezes/rales. CV: Nondisplaced PMI.  Tachycardic, irregular rhythm, normal S1/S2, no S3, no murmur. Trace bilateral pretibial and periankle edema.   Abdomen: Morbidly obese.  Neurologic: Alert and oriented.  Psych: Normal affect. Skin: Normal. Musculoskeletal: No gross deformities. Extremities: No clubbing or cyanosis.   ECG: Most recent ECG reviewed.    ASSESSMENT AND PLAN: 1. Chronic diastolic heart failure: Euvolemic. No change to diuretic regimen. Needs more aggressive BP control. 2. Atrial fibrillation with RVR: Tachycardic. Will  increase Toprol-XL to 50 mg daily. Continue Cardizem CD 120 mg daily. Not a candidate for anticoagulation due to noncompliance and thus potential for adverse bleeding events. 3. Nonsustained VT: No chest pain. Normal LV systolic function. If he were to have recurrences, I would consider stress testing. 4. Essential HTN: Poorly controlled. Will increase lisinopril to 40 mg daily. 5. Medication noncompliance: Encouraged to take meds as directed.  Dispo: f/u 3 months.  Prentice Docker, M.D., F.A.C.C.

## 2014-05-25 NOTE — Patient Instructions (Signed)
   Increase Toprol XL to 50mg  daily  Increase Lisinopril to 40mg  daily  New medications sent to pharmacy on above. Continue all other medications.   Follow up in  3 months

## 2014-06-21 ENCOUNTER — Other Ambulatory Visit: Payer: Self-pay

## 2014-06-21 ENCOUNTER — Emergency Department (HOSPITAL_COMMUNITY)
Admission: EM | Admit: 2014-06-21 | Discharge: 2014-06-21 | Disposition: A | Discharge status: Home / Self Care | Payer: Medicare Other | Attending: Emergency Medicine | Admitting: Emergency Medicine

## 2014-06-21 ENCOUNTER — Emergency Department (HOSPITAL_COMMUNITY): Payer: Medicare Other

## 2014-06-21 ENCOUNTER — Ambulatory Visit (HOSPITAL_COMMUNITY): Admission: RE | Admit: 2014-06-21 | Payer: Medicare Other | Source: Ambulatory Visit

## 2014-06-21 ENCOUNTER — Encounter (HOSPITAL_COMMUNITY): Payer: Self-pay | Admitting: *Deleted

## 2014-06-21 ENCOUNTER — Encounter: Payer: Self-pay | Admitting: Gastroenterology

## 2014-06-21 ENCOUNTER — Ambulatory Visit (INDEPENDENT_AMBULATORY_CARE_PROVIDER_SITE_OTHER): Payer: Medicare Other | Admitting: Gastroenterology

## 2014-06-21 VITALS — BP 175/110 | HR 55 | Temp 98.3°F | Ht 72.0 in | Wt 317.6 lb

## 2014-06-21 DIAGNOSIS — I110 Hypertensive heart disease with heart failure: Secondary | ICD-10-CM | POA: Insufficient documentation

## 2014-06-21 DIAGNOSIS — Z87891 Personal history of nicotine dependence: Secondary | ICD-10-CM | POA: Insufficient documentation

## 2014-06-21 DIAGNOSIS — Z7951 Long term (current) use of inhaled steroids: Secondary | ICD-10-CM | POA: Diagnosis not present

## 2014-06-21 DIAGNOSIS — K219 Gastro-esophageal reflux disease without esophagitis: Secondary | ICD-10-CM

## 2014-06-21 DIAGNOSIS — R0600 Dyspnea, unspecified: Secondary | ICD-10-CM | POA: Diagnosis not present

## 2014-06-21 DIAGNOSIS — I509 Heart failure, unspecified: Secondary | ICD-10-CM

## 2014-06-21 DIAGNOSIS — E662 Morbid (severe) obesity with alveolar hypoventilation: Secondary | ICD-10-CM | POA: Insufficient documentation

## 2014-06-21 DIAGNOSIS — Z8639 Personal history of other endocrine, nutritional and metabolic disease: Secondary | ICD-10-CM | POA: Insufficient documentation

## 2014-06-21 DIAGNOSIS — Z8739 Personal history of other diseases of the musculoskeletal system and connective tissue: Secondary | ICD-10-CM | POA: Diagnosis not present

## 2014-06-21 DIAGNOSIS — I4891 Unspecified atrial fibrillation: Secondary | ICD-10-CM

## 2014-06-21 DIAGNOSIS — R152 Fecal urgency: Secondary | ICD-10-CM

## 2014-06-21 DIAGNOSIS — R609 Edema, unspecified: Secondary | ICD-10-CM

## 2014-06-21 DIAGNOSIS — Z79899 Other long term (current) drug therapy: Secondary | ICD-10-CM | POA: Diagnosis not present

## 2014-06-21 DIAGNOSIS — M7989 Other specified soft tissue disorders: Secondary | ICD-10-CM

## 2014-06-21 DIAGNOSIS — R2241 Localized swelling, mass and lump, right lower limb: Secondary | ICD-10-CM | POA: Diagnosis present

## 2014-06-21 DIAGNOSIS — R600 Localized edema: Secondary | ICD-10-CM

## 2014-06-21 LAB — TROPONIN I

## 2014-06-21 LAB — CBC WITH DIFFERENTIAL/PLATELET
Basophils Absolute: 0 10*3/uL (ref 0.0–0.1)
Basophils Relative: 0 % (ref 0–1)
Eosinophils Absolute: 0.1 10*3/uL (ref 0.0–0.7)
Eosinophils Relative: 2 % (ref 0–5)
HCT: 39.6 % (ref 39.0–52.0)
HEMOGLOBIN: 13 g/dL (ref 13.0–17.0)
LYMPHS PCT: 32 % (ref 12–46)
Lymphs Abs: 1.1 10*3/uL (ref 0.7–4.0)
MCH: 28.5 pg (ref 26.0–34.0)
MCHC: 32.8 g/dL (ref 30.0–36.0)
MCV: 86.8 fL (ref 78.0–100.0)
MONOS PCT: 8 % (ref 3–12)
Monocytes Absolute: 0.3 10*3/uL (ref 0.1–1.0)
NEUTROS PCT: 58 % (ref 43–77)
Neutro Abs: 1.9 10*3/uL (ref 1.7–7.7)
PLATELETS: 208 10*3/uL (ref 150–400)
RBC: 4.56 MIL/uL (ref 4.22–5.81)
RDW: 13.4 % (ref 11.5–15.5)
WBC: 3.3 10*3/uL — AB (ref 4.0–10.5)

## 2014-06-21 LAB — BASIC METABOLIC PANEL
Anion gap: 5 (ref 5–15)
BUN: 15 mg/dL (ref 6–23)
CHLORIDE: 111 meq/L (ref 96–112)
CO2: 25 mmol/L (ref 19–32)
Calcium: 8.5 mg/dL (ref 8.4–10.5)
Creatinine, Ser: 1 mg/dL (ref 0.50–1.35)
GFR, EST NON AFRICAN AMERICAN: 89 mL/min — AB (ref >90)
GLUCOSE: 112 mg/dL — AB (ref 70–99)
Potassium: 3.6 mmol/L (ref 3.5–5.1)
Sodium: 141 mmol/L (ref 135–145)

## 2014-06-21 LAB — BRAIN NATRIURETIC PEPTIDE: B NATRIURETIC PEPTIDE 5: 306 pg/mL — AB (ref 0.0–100.0)

## 2014-06-21 MED ORDER — NITROGLYCERIN 0.4 MG SL SUBL
0.4000 mg | SUBLINGUAL_TABLET | Freq: Once | SUBLINGUAL | Status: AC
Start: 2014-06-21 — End: 2014-06-21
  Administered 2014-06-21: 0.4 mg via SUBLINGUAL
  Filled 2014-06-21: qty 1

## 2014-06-21 MED ORDER — OMEPRAZOLE 20 MG PO CPDR: 20.0000 mg | DELAYED_RELEASE_CAPSULE | Freq: Every day | ORAL | Status: DC

## 2014-06-21 MED ORDER — FUROSEMIDE 10 MG/ML IJ SOLN
20.0000 mg | Freq: Once | INTRAMUSCULAR | Status: AC
  Administered 2014-06-21: 20 mg via INTRAVENOUS
  Filled 2014-06-21: qty 2

## 2014-06-21 NOTE — Discharge Instructions (Signed)
Take an extra Lasix 20 mg dose the next 3 days in follow-up for recheck with local physician.  keep a log of your blood pressures daily  As you may require change in medications. If you were given medicines take as directed.  If you are on coumadin or contraceptives realize their levels and effectiveness is altered by many different medicines.  If you have any reaction (rash, tongues swelling, other) to the medicines stop taking and see a physician.   Please follow up as directed and return to the ER or see a physician for new or worsening symptoms.  Thank you. Filed Vitals:   06/21/14 1633 06/21/14 1845 06/21/14 1852 06/21/14 1900  BP: 178/113 178/143 180/121 166/108  Pulse: 110 90  64  Temp: 98.2 F (36.8 C)     TempSrc: Oral     Resp: 20 0  17  Height: 6' (1.829 m)     Weight: 315 lb (142.883 kg)     SpO2: 100% 97%  98%

## 2014-06-21 NOTE — Patient Instructions (Signed)
1. Please have your ultrasound done as scheduled.  2. Start omeprazole once daily before breakfast to cover for ulcers and GERD. 3. We will touch base first of the week with plan for work up but await u/s findings first.

## 2014-06-21 NOTE — Assessment & Plan Note (Signed)
One month history of postprandial fecal urgency without diarrhea. No prior colonoscopy. Given he is having solid stools, no indication for stool studies. Will offer him a colonoscopy in the near future however await lower extremity ultrasound findings first.

## 2014-06-21 NOTE — ED Notes (Signed)
Patient verbalizes understanding of discharge instructions, home care and follow up care. Patient ambulatory out of department at this time with family. 

## 2014-06-21 NOTE — Assessment & Plan Note (Signed)
Vague anorexia, weight loss, heartburn. We'll likely offer upper endoscopy for further evaluation however at this time start PPI therapy and await lower extremity Doppler ultrasound findings. Further recommendations to follow.

## 2014-06-21 NOTE — Progress Notes (Signed)
Primary Care Physician:  No PCP Per Patient  Primary Gastroenterologist:  Roetta SessionsMichael Rourk, MD   Chief Complaint  Patient presents with  . Irritable Bowel Syndrome    HPI:  [name] is a 48 y.o. male here as new patient self referral for further evaluation of IBS/PUD. Patient was encouraged by his cardiologist to make an appointment with us because of complaints of stomach pain and 22 pound weight loss since October 2015. Complain of anorexia and postprandial fecal urgency. Patient reports he's had history of ulcers all of his life. Denies any prior endoscopic evaluation. No prior colonoscopy.  Hospitalized in October with acute on chronic diastolic congestive heart failure, A. fib with RVR. Deemed not a candidate for anticoagulation due to patient's noncompliance and thus potential for adverse bleeding events.   Patient complains of "spitting up pink tinged mucus". Denies hematemesis. Previously had heartburn about every other night but here recently no significant symptoms. No longer on Prilosec. Complains of his stomach bubbling and feeling uneasy. Appetite is poor. Takes Alka-Seltzer for his stomach. Postprandial fecal urgency within 20 minutes of any meals. Denies loose or watery stools. May have 3-5 solid stools daily. Occasional nocturnal bowel movement. States his urine is dark brown. Denies melena rectal bleeding. Denies abdominal pain, vomiting, dysphagia.   04/04/2014 he weighed 352 pounds 05/07/2014 he weighed 324 pounds 06/21/2014 he weighs 317 pounds  Current Outpatient Prescriptions  Medication Sig Dispense Refill  . albuterol-ipratropium (COMBIVENT) 18-103 MCG/ACT inhaler Inhale 2 puffs into the lungs every 6 (six) hours as needed for wheezing or shortness of breath. 1 Inhaler o  . beclomethasone (QVAR) 80 MCG/ACT inhaler Inhale 1 puff into the lungs as needed (shortness of breath). 1 Inhaler 12  . COMBIVENT RESPIMAT 20-100 MCG/ACT AERS respimat   0  . diltiazem (CARDIZEM  CD) 120 MG 24 hr capsule Take 1 capsule (120 mg total) by mouth daily. 30 capsule 0  . furosemide (LASIX) 20 MG tablet Take 1 tablet (20 mg total) by mouth daily. 30 tablet 0  . lisinopril (PRINIVIL,ZESTRIL) 40 MG tablet Take 1 tablet (40 mg total) by mouth daily. 30 tablet 6  . metoprolol succinate (TOPROL-XL) 50 MG 24 hr tablet Take 1 tablet (50 mg total) by mouth daily. 30 tablet 6  . potassium chloride SA (K-DUR,KLOR-CON) 20 MEQ tablet Take 1 tablet (20 mEq total) by mouth daily. 30 tablet 0   No current facility-administered medications for this visit.    Allergies as of 06/21/2014 - Review Complete 06/21/2014  Allergen Reaction Noted  . Bee venom Shortness Of Breath and Swelling 04/03/2011  . Aspirin Other (See Comments) 04/03/2011    Past Medical History  Diagnosis Date  . Hypertensive heart disease     Hypertensive heart disease with prior episodes of CHF  . Hyperlipidemia   . Hypertension   . Gastroesophageal reflux disease   . Osteoarthritis   . Peptic ulcer disease   . Morbid obesity     /sleep apnea  . Allergic rhinitis     plus h/o asthma  . Tobacco abuse, in remission     Cigarettes discontinued in 2010  . CHF (congestive heart failure)   . A-fib     2015    Past Surgical History  Procedure Laterality Date  . None      Family History  Problem Relation Age of Onset  . Colon cancer Neg Hx   . Inflammatory bowel disease Neg Hx   . Liver disease Neg Hx  History   Social History  . Marital Status: Divorced    Spouse Name: N/A    Number of Children: 3  . Years of Education: N/A   Occupational History  . disabled    Social History Main Topics  . Smoking status: Former Smoker -- 0.50 packs/day for 20 years    Types: Cigarettes    Start date: 04/26/1988    Quit date: 06/23/2007  . Smokeless tobacco: Never Used     Comment: 1 ppd former smoker  . Alcohol Use: No     Comment: no etoh since 2009  . Drug Use: No  . Sexual Activity: No   Other  Topics Concern  . Not on file   Social History Narrative      ROS:  General: Negative for fever, chills, fatigue, weakness. See history of present illness Eyes: Negative for vision changes.  ENT: Negative for hoarseness, difficulty swallowing , nasal congestion. CV: Negative for chest pain, angina, palpitations, dyspnea on exertion, peripheral edema.  Respiratory: Negative for dyspnea at rest, dyspnea on exertion, cough, sputum, wheezing.  GI: See history of present illness. GU:  Negative for dysuria, hematuria, urinary incontinence, urinary frequency, nocturnal urination.  MS: Negative for joint pain, low back pain.  Derm: Negative for rash or itching.  Neuro: Negative for weakness, abnormal sensation, seizure, frequent headaches, memory loss, confusion.  Psych: Negative for anxiety, depression, suicidal ideation, hallucinations.  Endo: See history of present illness Heme: Negative for bruising or bleeding. Allergy: Negative for rash or hives.    Physical Examination:  BP 175/110 mmHg  Pulse 55  Temp(Src) 98.3 F (36.8 C) (Oral)  Ht 6' (1.829 m)  Wt 317 lb 9.6 oz (144.062 kg)  BMI 43.06 kg/m2   General: Well-nourished, well-developed, morbidly obese, in no acute distress.  Head: Normocephalic, atraumatic.   Eyes: Conjunctiva pink, no icterus. Mouth: Oropharyngeal mucosa moist and pink , no lesions erythema or exudate. Neck: Supple without thyromegaly, masses, or lymphadenopathy.  Lungs: Clear to auscultation bilaterally.  Heart: Regular rate and rhythm, no murmurs rubs or gallops.  Abdomen: Bowel sounds are normal, nontender, nondistended, no hepatosplenomegaly or masses, no abdominal bruits or    hernia , no rebound or guarding.   Rectal: Not performed Extremities: Right greater than left lower extremity edema 2+  No clubbing or deformities.  Neuro: Alert and oriented x 4 , grossly normal neurologically.  Skin: Warm and dry, no rash or jaundice.   Psych: Alert and  cooperative, normal mood and affect.  Labs: Lab Results  Component Value Date   WBC 2.7* 04/04/2014   HGB 15.7 04/04/2014   HCT 47.1 04/04/2014   MCV 84.9 04/04/2014   PLT 192 04/04/2014   Lab Results  Component Value Date   CREATININE 1.14 04/04/2014   BUN 21 04/04/2014   NA 143 04/04/2014   K 4.3 04/04/2014   CL 104 04/04/2014   CO2 26 04/04/2014   Lab Results  Component Value Date   ALT 19 04/03/2014   AST 23 04/03/2014   ALKPHOS 48 04/03/2014   BILITOT 1.5* 04/03/2014   No results found for: LIPASE   Imaging Studies: No results found.

## 2014-06-21 NOTE — ED Notes (Signed)
Patient ambulatory back to patient care room unassisted. No needs voiced at this time.

## 2014-06-21 NOTE — ED Notes (Signed)
Pt states he take OTC meds at night for acid reflux and has relief in 5 mins

## 2014-06-21 NOTE — ED Notes (Signed)
US at the bedside

## 2014-06-21 NOTE — ED Notes (Signed)
Patient ambulatory to restroom, steady gait. No needs voiced.

## 2014-06-21 NOTE — Assessment & Plan Note (Signed)
48 year old gentleman with history of chronic lower extremity edema with current right greater than left swelling. History of atrial fibrillation with irregular rhythm on exam today. Patient deemed not a candidate for anticoagulation due to his medical noncompliance. Obtain venous ultrasound to rule out DVT.

## 2014-06-21 NOTE — ED Notes (Signed)
Patient states R leg swelling x 6 years.  Dr. Juanetta Gosling is PCP.  Painful with walking.  Never had [place] of leg done for DVT or checked for PAD.  Also reports frequent stools after eating.

## 2014-06-21 NOTE — ED Provider Notes (Signed)
CSN: [id number]     Arrival date & time 06/21/14  1623 History   First MD Initiated Contact with Patient 06/21/14 1635     Chief Complaint  Patient presents with  . Leg Swelling     (Consider location/radiation/quality/duration/timing/severity/associated sxs/prior Treatment) HPI Comments:  48 year old male with history of obesity, lipids, depression, high blood pressure , congestive heart failure , asthma , takes Lasix 20 mg daily, unsure if taking regularly currently. Patient presents with right leg swelling for 5-6 years intermittent , shortness of breath fairly constant and chronic for the past few years. Patient unsure regarding weight gain. Patient has mild pain in the right lower extremity similar previous. No injuries, no blood clot history, patient did not show up for her the ultrasound that was ordered previous. No active cancer or blood clot history. Patient has very brief anterior chest pain in bilateral upper arm pain for the past few months , nonexertional, no diaphoresis, no heart attack history. Currently no chest pain.  The history is provided by the patient.    Past Medical History  Diagnosis Date  . Hypertensive heart disease     Hypertensive heart disease with prior episodes of CHF  . Hyperlipidemia   . Hypertension   . Gastroesophageal reflux disease   . Osteoarthritis   . Peptic ulcer disease   . Morbid obesity     /sleep apnea  . Allergic rhinitis     plus h/o asthma  . Tobacco abuse, in remission     Cigarettes discontinued in 2010  . CHF (congestive heart failure)   . A-fib     2015   Past Surgical History  Procedure Laterality Date  . None     Family History  Problem Relation Age of Onset  . Colon cancer Neg Hx   . Inflammatory bowel disease Neg Hx   . Liver disease Neg Hx    History  Substance Use Topics  . Smoking status: Former Smoker -- 0.50 packs/day for 20 years    Types: Cigarettes    Start date: 04/26/1988    Quit date: 06/23/2007  .  Smokeless tobacco: Never Used     Comment: 1 ppd former smoker  . Alcohol Use: No     Comment: no etoh since 2009    Review of Systems  Constitutional: Negative for fever and chills.  HENT: Negative for congestion.   Eyes: Negative for visual disturbance.  Respiratory: Positive for shortness of breath.   Cardiovascular: Positive for chest pain and leg swelling.  Gastrointestinal: Negative for vomiting and abdominal pain.  Genitourinary: Negative for dysuria and flank pain.  Musculoskeletal: Positive for arthralgias. Negative for back pain, neck pain and neck stiffness.  Skin: Negative for rash.  Neurological: Positive for headaches. Negative for light-headedness.      Allergies  Bee venom and Aspirin  Home Medications   Prior to Admission medications   Medication Sig Start Date End Date Taking? Authorizing Provider  aspirin-sod bicarb-citric acid (ALKA-SELTZER) 325 MG TBEF tablet Take 325 mg by mouth daily. For pain in arm/chest   Yes Historical Provider, MD  beclomethasone (QVAR) 80 MCG/ACT inhaler Inhale 1 puff into the lungs as needed (shortness of breath). Patient taking differently: Inhale 2 puffs into the lungs every morning.  04/04/14  Yes Lesle ChrisKaren M Black, NP  COMBIVENT RESPIMAT 20-100 MCG/ACT AERS respimat Inhale 2 puffs into the lungs at bedtime.  04/05/14  Yes Historical Provider, MD  diltiazem (CARDIZEM CD) 120 MG 24 hr capsule Take  1 capsule (120 mg total) by mouth daily. Patient taking differently: Take 120 mg by mouth 2 (two) times a week.  04/04/14  Yes Lesle Chris Black, NP  furosemide (LASIX) 20 MG tablet Take 1 tablet (20 mg total) by mouth daily. 04/05/14  Yes Lesle Chris Black, NP  lisinopril (PRINIVIL,ZESTRIL) 40 MG tablet Take 1 tablet (40 mg total) by mouth daily. 05/25/14  Yes [name], MD  metoprolol succinate (TOPROL-XL) 25 MG 24 hr tablet Take 25 mg by mouth every other day.  06/03/14  Yes Historical Provider, MD  potassium chloride SA (K-DUR,KLOR-CON)  20 MEQ tablet Take 1 tablet (20 mEq total) by mouth daily. 04/05/14  Yes Gwenyth Bender, NP  albuterol-ipratropium (COMBIVENT) 18-103 MCG/ACT inhaler Inhale 2 puffs into the lungs every 6 (six) hours as needed for wheezing or shortness of breath. Patient not taking: Reported on 06/21/2014 04/04/14   Gwenyth Bender, NP  metoprolol succinate (TOPROL-XL) 50 MG 24 hr tablet Take 1 tablet (50 mg total) by mouth daily. Patient not taking: Reported on 06/21/2014 05/25/14   [name], MD  omeprazole (PRILOSEC) 20 MG capsule Take 1 capsule (20 mg total) by mouth daily. 06/21/14   Tiffany Kocher, PA-C   BP 166/108 mmHg  Pulse 64  Temp(Src) 98.2 F (36.8 C) (Oral)  Resp 17  Ht 6' (1.829 m)  Wt 315 lb (142.883 kg)  BMI 42.71 kg/m2  SpO2 98% Physical Exam  Constitutional: He is oriented to person, place, and time. He appears well-developed and well-nourished.  HENT:  Head: Normocephalic and atraumatic.  Eyes: Conjunctivae are normal. Right eye exhibits no discharge. Left eye exhibits no discharge.  Neck: Normal range of motion. Neck supple. No tracheal deviation present.  Cardiovascular: Regular rhythm.  Tachycardia present.   Pulmonary/Chest: Effort normal and breath sounds normal. Respiratory distress:  difficulty appreciating lung sounds due to body habitus.  Abdominal: Soft. He exhibits no distension. There is no tenderness ( obesity morbid). There is no guarding.  Musculoskeletal: He exhibits edema ( 1+ bilateral lower extremities worse in the right,  dilated superficial veins anterior tibia on the right. No focal tenderness appreciated. No signs of infection.).  Neurological: He is alert and oriented to person, place, and time.  Skin: Skin is warm. No rash noted.  Psychiatric: He has a normal mood and affect.  Nursing note and vitals reviewed.   ED Course  Procedures (including critical care time) Labs Review Labs Reviewed  BASIC METABOLIC PANEL - Abnormal; Notable for the  following:    Glucose, Bld 112 (*)    GFR calc non Af Amer 89 (*)    All other components within normal limits  CBC WITH DIFFERENTIAL - Abnormal; Notable for the following:    WBC 3.3 (*)    All other components within normal limits  BRAIN NATRIURETIC PEPTIDE - Abnormal; Notable for the following:    B Natriuretic Peptide 306.0 (*)    All other components within normal limits  TROPONIN I    Imaging Review Dg Chest 2 View  06/21/2014   CLINICAL DATA:  Initial encounter for shortness of breath and pain radiating down both arms.  EXAM: CHEST  2 VIEW  COMPARISON:  04/03/2014  FINDINGS: The cardio pericardial silhouette is enlarged. Vascular congestion noted without overt pulmonary edema. No pneumothorax, focal airspace consolidation, or pleural effusion. Imaged bony structures of the thorax are intact.  IMPRESSION: Cardiomegaly with vascular congestion.   Electronically Signed   By: Minerva Areola  Molli Posey M.D.   On: 06/21/2014 17:43   US Venous Img Lower Unilateral Right  06/21/2014   CLINICAL DATA:  Initial encounter for right lower extremity edema, chronic  EXAM: Right LOWER EXTREMITY VENOUS DOPPLER ULTRASOUND  TECHNIQUE: Gray-scale sonography with graded compression, as well as color Doppler and duplex ultrasound were performed to evaluate the lower extremity deep venous systems from the level of the common femoral vein and including the common femoral, femoral, profunda femoral, popliteal and calf veins including the posterior tibial, peroneal and gastrocnemius veins when visible. The superficial great saphenous vein was also interrogated. Spectral Doppler was utilized to evaluate flow at rest and with distal augmentation maneuvers in the common femoral, femoral and popliteal veins.  COMPARISON:  None.  FINDINGS: Contralateral Common Femoral Vein: Respiratory phasicity is normal and symmetric with the symptomatic side. No evidence of thrombus. Normal compressibility.  Common Femoral Vein: No evidence of  thrombus. Normal compressibility, respiratory phasicity and response to augmentation.  Saphenofemoral Junction: No evidence of thrombus. Normal compressibility and flow on color Doppler imaging.  Profunda Femoral Vein: No evidence of thrombus. Normal compressibility and flow on color Doppler imaging.  Femoral Vein: No evidence of thrombus. Normal compressibility, respiratory phasicity and response to augmentation.  Popliteal Vein: No evidence of thrombus. Normal compressibility, respiratory phasicity and response to augmentation.  Calf Veins: No evidence of thrombus. Normal compressibility and flow on color Doppler imaging.  Superficial Great Saphenous Vein: No evidence of thrombus. Normal compressibility and flow on color Doppler imaging.  Venous Reflux: Visualized in the common femoral vein and profunda femoral vein.  Other Findings:  Prominent varicosities are seen in the calf region.  IMPRESSION: No evidence of deep venous thrombosis.   Electronically Signed   By: Kennith Center M.D.   On: 06/21/2014 18:03     EKG Interpretation   Date/Time:  Thursday June 21 2014 17:53:36 EST Ventricular Rate:  92 PR Interval:    QRS Duration: 89 QT Interval:  371 QTC Calculation: 459 R Axis:   55 Text Interpretation:  Atrial fibrillation Confirmed by Tell Rozelle  MD, Eldwin Volkov  ([id number]) on 06/21/2014 7:32:05 PM      MDM   Final diagnoses:  Dyspnea  Right leg swelling  Acute on chronic congestive heart failure, unspecified congestive heart failure type    patient presents with multiple symptoms that he has had for years , gradually worsening recently.  Patient's had right leg swelling , clinically  Patient has varicose vein dilation however with morbid obesity and patient has not had ultrasound of his leg, ultrasound ordered to look for DVT. Patient currently has no shortness breath or chest pain however his had these intermittent chronic for years. Patient has a primary doctor follow-up with. Cardiac screen  to look for signs of pulmonary edema pending, EKG and troponin.   EKG reviewed atrial fibrillation similar previous. Ultrasound performed, no deep venous thrombosis per report. Patient's blood pressure improved in the ER , no chest pain or shortness of breath. Jodi symptoms are chronic and patient will require close outpatient follow. Chest x-ray reviewed mild congestion. Lasix given in the ER. Discussed additional oral Lasix 3 days.  Results and differential diagnosis were discussed with the patient/parent/guardian. Close follow up outpatient was discussed, comfortable with the plan.   Medications  nitroGLYCERIN (NITROSTAT) SL tablet 0.4 mg (0.4 mg Sublingual Given 06/21/14 1845)  furosemide (LASIX) injection 20 mg (20 mg Intravenous Given 06/21/14 1844)    Filed Vitals:   06/21/14 1633 06/21/14 1845 06/21/14  1852 06/21/14 1900  BP: 178/113 178/143 180/121 166/108  Pulse: 110 90  64  Temp: 98.2 F (36.8 C)     TempSrc: Oral     Resp: 20 0  17  Height: 6' (1.829 m)     Weight: 315 lb (142.883 kg)     SpO2: 100% 97%  98%    Final diagnoses:  Dyspnea  Right leg swelling  Acute on chronic congestive heart failure, unspecified congestive heart failure type         Enid Skeens, MD 06/21/14 1940

## 2014-06-28 ENCOUNTER — Emergency Department (HOSPITAL_COMMUNITY): Payer: Medicare Other

## 2014-06-28 ENCOUNTER — Inpatient Hospital Stay (HOSPITAL_COMMUNITY)
Admission: EM | Admit: 2014-06-28 | Discharge: 2014-06-30 | DRG: 292 | Disposition: A | Discharge status: Home / Self Care | Payer: Medicare Other | Attending: Family Medicine | Admitting: Family Medicine

## 2014-06-28 ENCOUNTER — Encounter (HOSPITAL_COMMUNITY): Payer: Self-pay | Admitting: Emergency Medicine

## 2014-06-28 DIAGNOSIS — I4891 Unspecified atrial fibrillation: Secondary | ICD-10-CM | POA: Diagnosis present

## 2014-06-28 DIAGNOSIS — G4733 Obstructive sleep apnea (adult) (pediatric): Secondary | ICD-10-CM | POA: Diagnosis present

## 2014-06-28 DIAGNOSIS — Z888 Allergy status to other drugs, medicaments and biological substances status: Secondary | ICD-10-CM

## 2014-06-28 DIAGNOSIS — Z9114 Patient's other noncompliance with medication regimen: Secondary | ICD-10-CM | POA: Diagnosis present

## 2014-06-28 DIAGNOSIS — F17201 Nicotine dependence, unspecified, in remission: Secondary | ICD-10-CM | POA: Diagnosis present

## 2014-06-28 DIAGNOSIS — R0602 Shortness of breath: Secondary | ICD-10-CM | POA: Insufficient documentation

## 2014-06-28 DIAGNOSIS — Z7982 Long term (current) use of aspirin: Secondary | ICD-10-CM

## 2014-06-28 DIAGNOSIS — I110 Hypertensive heart disease with heart failure: Secondary | ICD-10-CM | POA: Diagnosis present

## 2014-06-28 DIAGNOSIS — I5033 Acute on chronic diastolic (congestive) heart failure: Secondary | ICD-10-CM | POA: Diagnosis present

## 2014-06-28 DIAGNOSIS — Z6841 Body Mass Index (BMI) 40.0 and over, adult: Secondary | ICD-10-CM

## 2014-06-28 DIAGNOSIS — I10 Essential (primary) hypertension: Secondary | ICD-10-CM

## 2014-06-28 DIAGNOSIS — Z87891 Personal history of nicotine dependence: Secondary | ICD-10-CM

## 2014-06-28 DIAGNOSIS — I509 Heart failure, unspecified: Secondary | ICD-10-CM | POA: Insufficient documentation

## 2014-06-28 DIAGNOSIS — J45909 Unspecified asthma, uncomplicated: Secondary | ICD-10-CM | POA: Diagnosis present

## 2014-06-28 DIAGNOSIS — Z91030 Bee allergy status: Secondary | ICD-10-CM | POA: Diagnosis not present

## 2014-06-28 DIAGNOSIS — K219 Gastro-esophageal reflux disease without esophagitis: Secondary | ICD-10-CM | POA: Diagnosis present

## 2014-06-28 DIAGNOSIS — G4730 Sleep apnea, unspecified: Secondary | ICD-10-CM | POA: Diagnosis present

## 2014-06-28 LAB — RAPID URINE DRUG SCREEN, HOSP PERFORMED
Amphetamines: NOT DETECTED
BENZODIAZEPINES: NOT DETECTED
Barbiturates: NOT DETECTED
Cocaine: NOT DETECTED
Opiates: NOT DETECTED
TETRAHYDROCANNABINOL: NOT DETECTED

## 2014-06-28 LAB — CBC WITH DIFFERENTIAL/PLATELET
BASOS PCT: 0 % (ref 0–1)
Basophils Absolute: 0 10*3/uL (ref 0.0–0.1)
EOS ABS: 0 10*3/uL (ref 0.0–0.7)
Eosinophils Relative: 1 % (ref 0–5)
HCT: 41.7 % (ref 39.0–52.0)
HEMOGLOBIN: 13.6 g/dL (ref 13.0–17.0)
Lymphocytes Relative: 16 % (ref 12–46)
Lymphs Abs: 0.9 10*3/uL (ref 0.7–4.0)
MCH: 28.5 pg (ref 26.0–34.0)
MCHC: 32.6 g/dL (ref 30.0–36.0)
MCV: 87.4 fL (ref 78.0–100.0)
MONO ABS: 0.5 10*3/uL (ref 0.1–1.0)
Monocytes Relative: 8 % (ref 3–12)
Neutro Abs: 4.4 10*3/uL (ref 1.7–7.7)
Neutrophils Relative %: 75 % (ref 43–77)
Platelets: 202 10*3/uL (ref 150–400)
RBC: 4.77 MIL/uL (ref 4.22–5.81)
RDW: 13.3 % (ref 11.5–15.5)
WBC: 5.8 10*3/uL (ref 4.0–10.5)

## 2014-06-28 LAB — COMPREHENSIVE METABOLIC PANEL
ALT: 22 U/L (ref 0–53)
ANION GAP: 6 (ref 5–15)
AST: 22 U/L (ref 0–37)
Albumin: 3.9 g/dL (ref 3.5–5.2)
Alkaline Phosphatase: 49 U/L (ref 39–117)
BILIRUBIN TOTAL: 2 mg/dL — AB (ref 0.3–1.2)
BUN: 15 mg/dL (ref 6–23)
CALCIUM: 8.7 mg/dL (ref 8.4–10.5)
CO2: 28 mmol/L (ref 19–32)
CREATININE: 1.21 mg/dL (ref 0.50–1.35)
Chloride: 106 mEq/L (ref 96–112)
GFR calc Af Amer: 81 mL/min — ABNORMAL LOW (ref >90)
GFR calc non Af Amer: 70 mL/min — ABNORMAL LOW (ref >90)
GLUCOSE: 92 mg/dL (ref 70–99)
Potassium: 3.3 mmol/L — ABNORMAL LOW (ref 3.5–5.1)
SODIUM: 140 mmol/L (ref 135–145)
TOTAL PROTEIN: 6.2 g/dL (ref 6.0–8.3)

## 2014-06-28 LAB — TROPONIN I
TROPONIN I: 0.04 ng/mL — AB (ref <0.031)
TROPONIN I: 0.03 ng/mL (ref <0.031)
Troponin I: 0.03 ng/mL (ref <0.031)
Troponin I: 0.03 ng/mL (ref <0.031)

## 2014-06-28 LAB — URINALYSIS, ROUTINE W REFLEX MICROSCOPIC
Bilirubin Urine: NEGATIVE
Glucose, UA: NEGATIVE mg/dL
Hgb urine dipstick: NEGATIVE
Ketones, ur: NEGATIVE mg/dL
Leukocytes, UA: NEGATIVE
Nitrite: NEGATIVE
Protein, ur: NEGATIVE mg/dL
Specific Gravity, Urine: 1.02 (ref 1.005–1.030)
Urobilinogen, UA: 2 mg/dL — ABNORMAL HIGH (ref 0.0–1.0)
pH: 7 (ref 5.0–8.0)

## 2014-06-28 LAB — PROTIME-INR
INR: 1.29 (ref 0.00–1.49)
PROTHROMBIN TIME: 16.3 s — AB (ref 11.6–15.2)

## 2014-06-28 LAB — BRAIN NATRIURETIC PEPTIDE: B NATRIURETIC PEPTIDE 5: 396 pg/mL — AB (ref 0.0–100.0)

## 2014-06-28 LAB — MRSA PCR SCREENING: MRSA BY PCR: NEGATIVE

## 2014-06-28 LAB — APTT: APTT: 32 s (ref 24–37)

## 2014-06-28 MED ORDER — ASPIRIN EC 81 MG PO TBEC
81.0000 mg | DELAYED_RELEASE_TABLET | Freq: Every day | ORAL | Status: DC
  Administered 2014-06-28 – 2014-06-30 (×3): 81 mg via ORAL
  Filled 2014-06-28 (×3): qty 1

## 2014-06-28 MED ORDER — NITROGLYCERIN 2 % TD OINT
1.0000 [in_us] | TOPICAL_OINTMENT | Freq: Four times a day (QID) | TRANSDERMAL | Status: DC
  Administered 2014-06-28: 1 [in_us] via TOPICAL

## 2014-06-28 MED ORDER — FUROSEMIDE 10 MG/ML IJ SOLN
40.0000 mg | Freq: Once | INTRAMUSCULAR | Status: AC
  Administered 2014-06-28: 40 mg via INTRAVENOUS
  Filled 2014-06-28: qty 4

## 2014-06-28 MED ORDER — METOPROLOL SUCCINATE ER 50 MG PO TB24: 50.0000 mg | ORAL_TABLET | Freq: Every day | ORAL | Status: DC

## 2014-06-28 MED ORDER — SODIUM CHLORIDE 0.9 % IJ SOLN
3.0000 mL | Freq: Two times a day (BID) | INTRAMUSCULAR | Status: DC
  Administered 2014-06-28 – 2014-06-30 (×3): 3 mL via INTRAVENOUS

## 2014-06-28 MED ORDER — DILTIAZEM HCL 25 MG/5ML IV SOLN
20.0000 mg | Freq: Once | INTRAVENOUS | Status: AC
  Administered 2014-06-28: 20 mg via INTRAVENOUS

## 2014-06-28 MED ORDER — DEXTROSE 5 % IV SOLN
5.0000 mg/h | INTRAVENOUS | Status: DC
  Administered 2014-06-28: 7.5 mg/h via INTRAVENOUS
  Filled 2014-06-28: qty 100

## 2014-06-28 MED ORDER — METOPROLOL SUCCINATE ER 25 MG PO TB24
25.0000 mg | ORAL_TABLET | ORAL | Status: DC
  Administered 2014-06-28: 25 mg via ORAL
  Filled 2014-06-28: qty 1

## 2014-06-28 MED ORDER — IPRATROPIUM BROMIDE 0.02 % IN SOLN
0.5000 mg | Freq: Every day | RESPIRATORY_TRACT | Status: DC
  Administered 2014-06-28 – 2014-06-29 (×2): 0.5 mg via RESPIRATORY_TRACT
  Filled 2014-06-28 (×2): qty 2.5

## 2014-06-28 MED ORDER — ENOXAPARIN SODIUM 40 MG/0.4ML ~~LOC~~ SOLN
40.0000 mg | SUBCUTANEOUS | Status: DC
  Administered 2014-06-28: 40 mg via SUBCUTANEOUS
  Filled 2014-06-28: qty 0.4

## 2014-06-28 MED ORDER — ENOXAPARIN SODIUM 80 MG/0.8ML ~~LOC~~ SOLN
70.0000 mg | SUBCUTANEOUS | Status: DC
  Administered 2014-06-29 – 2014-06-30 (×2): 70 mg via SUBCUTANEOUS
  Filled 2014-06-28 (×2): qty 0.8

## 2014-06-28 MED ORDER — FUROSEMIDE 20 MG PO TABS
20.0000 mg | ORAL_TABLET | Freq: Every day | ORAL | Status: DC
  Administered 2014-06-29 – 2014-06-30 (×2): 20 mg via ORAL
  Filled 2014-06-28 (×2): qty 1

## 2014-06-28 MED ORDER — IPRATROPIUM-ALBUTEROL 20-100 MCG/ACT IN AERS: 2.0000 | INHALATION_SPRAY | Freq: Every day | RESPIRATORY_TRACT | Status: DC

## 2014-06-28 MED ORDER — NITROGLYCERIN 2 % TD OINT
1.0000 [in_us] | TOPICAL_OINTMENT | Freq: Once | TRANSDERMAL | Status: AC
Start: 2014-06-28 — End: 2014-06-28
  Administered 2014-06-28: 1 [in_us] via TOPICAL
  Filled 2014-06-28: qty 1

## 2014-06-28 MED ORDER — POTASSIUM CHLORIDE CRYS ER 20 MEQ PO TBCR
20.0000 meq | EXTENDED_RELEASE_TABLET | Freq: Every day | ORAL | Status: DC
  Administered 2014-06-28 – 2014-06-29 (×2): 20 meq via ORAL
  Filled 2014-06-28 (×2): qty 1

## 2014-06-28 MED ORDER — DILTIAZEM HCL ER COATED BEADS 120 MG PO CP24
120.0000 mg | ORAL_CAPSULE | Freq: Every day | ORAL | Status: DC
  Administered 2014-06-28 – 2014-06-29 (×2): 120 mg via ORAL
  Filled 2014-06-28 (×2): qty 1

## 2014-06-28 MED ORDER — ONDANSETRON HCL 4 MG/2ML IJ SOLN: 4.0000 mg | Freq: Four times a day (QID) | INTRAMUSCULAR | Status: DC | PRN

## 2014-06-28 MED ORDER — ACETAMINOPHEN 325 MG PO TABS: 650.0000 mg | ORAL_TABLET | ORAL | Status: DC | PRN

## 2014-06-28 MED ORDER — ALBUTEROL SULFATE (2.5 MG/3ML) 0.083% IN NEBU
2.5000 mg | INHALATION_SOLUTION | Freq: Every day | RESPIRATORY_TRACT | Status: DC
  Administered 2014-06-28 – 2014-06-29 (×2): 2.5 mg via RESPIRATORY_TRACT
  Filled 2014-06-28 (×2): qty 3

## 2014-06-28 MED ORDER — METOPROLOL SUCCINATE ER 25 MG PO TB24
25.0000 mg | ORAL_TABLET | Freq: Every day | ORAL | Status: DC
  Administered 2014-06-29 – 2014-06-30 (×2): 25 mg via ORAL
  Filled 2014-06-28 (×2): qty 1

## 2014-06-28 MED ORDER — DILTIAZEM HCL 100 MG IV SOLR
5.0000 mg/h | Freq: Once | INTRAVENOUS | Status: AC
  Administered 2014-06-28: 5 mg/h via INTRAVENOUS
  Filled 2014-06-28: qty 100

## 2014-06-28 MED ORDER — IPRATROPIUM-ALBUTEROL 0.5-2.5 (3) MG/3ML IN SOLN: 3.0000 mL | Freq: Four times a day (QID) | RESPIRATORY_TRACT | Status: DC | PRN

## 2014-06-28 MED ORDER — SODIUM CHLORIDE 0.9 % IV SOLN: 250.0000 mL | INTRAVENOUS | Status: DC | PRN

## 2014-06-28 MED ORDER — SODIUM CHLORIDE 0.9 % IJ SOLN: 3.0000 mL | INTRAMUSCULAR | Status: DC | PRN

## 2014-06-28 MED ORDER — LISINOPRIL 10 MG PO TABS
40.0000 mg | ORAL_TABLET | Freq: Every day | ORAL | Status: DC
  Administered 2014-06-28 – 2014-06-30 (×3): 40 mg via ORAL
  Filled 2014-06-28 (×4): qty 4

## 2014-06-28 MED ORDER — FUROSEMIDE 10 MG/ML IJ SOLN: 40.0000 mg | Freq: Two times a day (BID) | INTRAMUSCULAR | Status: DC

## 2014-06-28 NOTE — ED Provider Notes (Signed)
CSN: [id number]     Arrival date & time 06/28/14  0249 History   First MD Initiated Contact with Patient 06/28/14 0255     Chief Complaint  Patient presents with  . Shortness of Breath     (Consider location/radiation/quality/duration/timing/severity/associated sxs/prior Treatment) HPI Patient has a history of CHF and atrial fibrillation as well as high blood pressure. He states he has not been taking his blood pressure medication recently. He's had increasing shortness of breath for the past month. It worsened this evening. He says the shortness of breath is worse when he is lying flat. Patient has had cough that is productive of pink sputum. Denies any chest pain. States earlier this evening was starting a woodstove and his house became smoking. He thinks this worsened his shortness of breath. He's had no fever or chills. Patient has known history of atrial fibrillation but is not on any anticoagulants.  Past Medical History  Diagnosis Date  . Hypertensive heart disease     Hypertensive heart disease with prior episodes of CHF  . Hyperlipidemia   . Hypertension   . Gastroesophageal reflux disease   . Osteoarthritis   . Peptic ulcer disease   . Morbid obesity     /sleep apnea  . Allergic rhinitis     plus h/o asthma  . Tobacco abuse, in remission     Cigarettes discontinued in 2010  . CHF (congestive heart failure)   . A-fib     2015   Past Surgical History  Procedure Laterality Date  . None     Family History  Problem Relation Age of Onset  . Colon cancer Neg Hx   . Inflammatory bowel disease Neg Hx   . Liver disease Neg Hx    History  Substance Use Topics  . Smoking status: Former Smoker -- 0.50 packs/day for 20 years    Types: Cigarettes    Start date: 04/26/1988    Quit date: 06/23/2007  . Smokeless tobacco: Never Used     Comment: 1 ppd former smoker  . Alcohol Use: No     Comment: no etoh since 2009    Review of Systems  Constitutional: Negative for fever  and chills.  Respiratory: Positive for cough and shortness of breath. Negative for wheezing.   Cardiovascular: Negative for chest pain, palpitations and leg swelling.  Gastrointestinal: Positive for nausea and vomiting. Negative for abdominal pain, diarrhea and constipation.  Musculoskeletal: Negative for back pain, neck pain and neck stiffness.  Skin: Negative for rash and wound.  Neurological: Negative for dizziness, weakness, light-headedness and headaches.  All other systems reviewed and are negative.     Allergies  Bee venom and Aspirin  Home Medications   Prior to Admission medications   Medication Sig Start Date End Date Taking? Authorizing Provider  albuterol-ipratropium (COMBIVENT) 18-103 MCG/ACT inhaler Inhale 2 puffs into the lungs every 6 (six) hours as needed for wheezing or shortness of breath. Patient not taking: Reported on 06/21/2014 04/04/14   Gwenyth Bender, NP  aspirin-sod bicarb-citric acid (ALKA-SELTZER) 325 MG TBEF tablet Take 325 mg by mouth daily. For pain in arm/chest    Historical Provider, MD  beclomethasone (QVAR) 80 MCG/ACT inhaler Inhale 1 puff into the lungs as needed (shortness of breath). Patient taking differently: Inhale 2 puffs into the lungs every morning.  04/04/14   Gwenyth Bender, NP  COMBIVENT RESPIMAT 20-100 MCG/ACT AERS respimat Inhale 2 puffs into the lungs at bedtime.  04/05/14   Historical Provider,  MD  diltiazem (CARDIZEM CD) 120 MG 24 hr capsule Take 1 capsule (120 mg total) by mouth daily. Patient taking differently: Take 120 mg by mouth 2 (two) times a week.  04/04/14   Gwenyth Bender, NP  furosemide (LASIX) 20 MG tablet Take 1 tablet (20 mg total) by mouth daily. 04/05/14   Gwenyth Bender, NP  lisinopril (PRINIVIL,ZESTRIL) 40 MG tablet Take 1 tablet (40 mg total) by mouth daily. 05/25/14   [name], MD  metoprolol succinate (TOPROL-XL) 25 MG 24 hr tablet Take 25 mg by mouth every other day.  06/03/14   Historical Provider, MD   metoprolol succinate (TOPROL-XL) 50 MG 24 hr tablet Take 1 tablet (50 mg total) by mouth daily. Patient not taking: Reported on 06/21/2014 05/25/14   [name], MD  omeprazole (PRILOSEC) 20 MG capsule Take 1 capsule (20 mg total) by mouth daily. 06/21/14   Tiffany Kocher, PA-C  potassium chloride SA (K-DUR,KLOR-CON) 20 MEQ tablet Take 1 tablet (20 mEq total) by mouth daily. 04/05/14   Gwenyth Bender, NP   BP 186/121 mmHg  Pulse 99  Temp(Src) 99.8 F (37.7 C) (Oral)  Resp 19  Ht 6' (1.829 m)  Wt 339 lb (153.769 kg)  BMI 45.97 kg/m2  SpO2 98% Physical Exam  Constitutional: He is oriented to person, place, and time. He appears well-developed and well-nourished. No distress.  HENT:  Head: Normocephalic and atraumatic.  Mouth/Throat: Oropharynx is clear and moist. No oropharyngeal exudate.  Eyes: EOM are normal. Pupils are equal, round, and reactive to light.  Neck: Normal range of motion. Neck supple. JVD present.  Cardiovascular:  Tachycardia. Irregularly irregular.  Pulmonary/Chest: Effort normal and breath sounds normal. No respiratory distress. He has no wheezes. He has no rales.  Crackles in bilateral bases. Increased work of breathing.  Abdominal: Soft. Bowel sounds are normal. He exhibits no distension and no mass. There is no tenderness. There is no rebound and no guarding.  Musculoskeletal: Normal range of motion. He exhibits no edema or tenderness.  Right greater than left leg swelling. Distal pulses intact.  Neurological: He is alert and oriented to person, place, and time.  Moves all extremities. Sensation fully intact.  Skin: Skin is warm and dry. No rash noted. No erythema.  Psychiatric: He has a normal mood and affect. His behavior is normal.  Nursing note and vitals reviewed.   ED Course  Procedures (including critical care time) Labs Review Labs Reviewed  BRAIN NATRIURETIC PEPTIDE - Abnormal; Notable for the following:    B Natriuretic Peptide 396.0 (*)     All other components within normal limits  COMPREHENSIVE METABOLIC PANEL - Abnormal; Notable for the following:    Potassium 3.3 (*)    Total Bilirubin 2.0 (*)    GFR calc non Af Amer 70 (*)    GFR calc Af Amer 81 (*)    All other components within normal limits  TROPONIN I - Abnormal; Notable for the following:    Troponin I 0.04 (*)    All other components within normal limits  URINALYSIS, ROUTINE W REFLEX MICROSCOPIC - Abnormal; Notable for the following:    Urobilinogen, UA 2.0 (*)    All other components within normal limits  PROTIME-INR - Abnormal; Notable for the following:    Prothrombin Time 16.3 (*)    All other components within normal limits  CBC WITH DIFFERENTIAL  APTT    Imaging Review Dg Chest Port 1 View  06/28/2014  CLINICAL DATA:  Shortness of breath this morning.  EXAM: PORTABLE CHEST - 1 VIEW  COMPARISON:  06/21/2014  FINDINGS: Cardiac enlargement with pulmonary vascular congestion. Developing hazy perihilar opacities suggesting developing edema. No blunting of costophrenic angles. No pneumothorax. No focal consolidation.  IMPRESSION: Cardiac enlargement with pulmonary vascular congestion and developing perihilar edema.   Electronically Signed   By: Burman Nieves M.D.   On: 06/28/2014 03:29     EKG Interpretation None      Date: 06/28/2014  Rate: 122  Rhythm: atrial fibrillation  QRS Axis: normal  Intervals: QT prolonged  ST/T Wave abnormalities: nonspecific T wave changes  Conduction Disutrbances:none  Narrative Interpretation:   Old EKG Reviewed: No significant changes other than rate noted.   MDM   Final diagnoses:  SOB (shortness of breath)  Acute exacerbation of CHF (congestive heart failure)  Atrial fibrillation with RVR    Patient states he is feeling better. Heart rate down to around 100 on Cardizem drip. Continues to have elevated blood pressure. Given bolus of Cardizem and started on nitroglycerin paste. Continues to deny chest pain  in the point. Discussed with hospitalist and we'll admit to a step down bed.    Loren Racer, MD 06/28/14 [phone]

## 2014-06-28 NOTE — Care Management Note (Addendum)
    Page 1 of 1   06/29/2014     11:57:28 AM CARE MANAGEMENT NOTE 06/29/2014  Patient:  [name], [name]   Account Number:  [id number]  Date Initiated:  06/28/2014  Documentation initiated by:  Kathyrn Sheriff  Subjective/Objective Assessment:   Pt admitted with CHF. Pt is from home with self care. Pt has no HH services, DME's or med needs prior to admission. Pt plans to discharge home with self care. Will continue to follow for CM needs.     Action/Plan:   Anticipated DC Date:  07/01/2014   Anticipated DC Plan:  HOME/SELF CARE      DC Planning Services  CM consult      Choice offered to / List presented to:             Status of service:  Completed, signed off Medicare Important Message given?  YES (If response is "NO", the following Medicare IM given date fields will be blank) Date Medicare IM given:  06/29/2014 Medicare IM given by:  Kathyrn Sheriff Date Additional Medicare IM given:   Additional Medicare IM given by:    Discharge Disposition:  HOME/SELF CARE  Per UR Regulation:    If discussed at Long Length of Stay Meetings, dates discussed:    Comments:  06/29/2014 1100 Kathyrn Sheriff, RN, MSN, PCCN  06/28/2014 1400 Kathyrn Sheriff, RN, MSN, [facility]

## 2014-06-28 NOTE — ED Notes (Signed)
Pt reporting that the SOB has been going on for about a month. Reports he does not always take his medications as instructed. States that at night he has difficulty breathing when he lies down and "I always vomit till my stomach is empty". Reports he was getting his wood burning stove working today and it got smoky in the house. Airway is patent and clear.

## 2014-06-28 NOTE — Progress Notes (Signed)
PROGRESS NOTE  REAL CONA WUJ:[id number] DOB: 13-Mar-1966 DOA: 06/28/2014 PCP: Fredirick Maudlin, MD (has not yet established) Cardiologist: Dr. Darl Householder   Summary: 49 year old man with history of atrial fibrillation, chronic diastolic heart failure, ongoing noncompliance with medications presented with subacute shortness of breath over the last 4 weeks, initial evaluation suggested acute on chronic diastolic congestive heart failure and atrial fibrillation with rapid ventricular response. He was admitted for IV diuresis and rate control.  Assessment/Plan: 1. Acute on chronic diastolic congestive heart failure. Appears clinically improved. Excellent diuresis. Normal LVEF, no with severe LVH by recent echocardiogram.  2. Atrial fibrillation with rapid ventricular response. Rate control achieved on diltiazem at 5 mg an hour. 3. Essential hypertension, poor control, accelerated hypertension, secondary to noncompliance.   4. Non-compliance with medications, not an anticoagulation candidate 5. Hyperbilirubinemia. Significance unclear.  6. History of nonsustained VT. if recurs, consider stress testing per cardiology as an outpatient.No current episodes.  7. GERD 8. OSA 9. Morbid obesity 10. Tobacco dependence in remission   Overall much improved. Plan to change to oral diuretics.   Change to oral rate control agents and wean off diltiazem in future.   Slow reduction in blood pressure, patient is asymptomatic, noncompliant and likely has long-standing poorly controlled blood pressure.   Wean oxygen  Replete potassium  Check CMP in AM  Code Status: full code DVT prophylaxis: Lovenox Family Communication: none Disposition Plan: home  Brendia Sacks, MD  [facility]  Pager [phone] If 7PM-7AM, please contact night-coverage at [email or web address], password [facility] 06/28/2014, 7:56 AM  LOS: 0 days   Consultants:    Procedures:  Echocardiogram October 2015: Study Conclusions  -  Procedure narrative: Transthoracic echocardiography. Image quality was poor. The study was technically difficult, as a result of poor sound wave transmission and body habitus. - Left ventricle: The cavity size was normal. Wall thickness was increased in a pattern of severe LVH. Systolic function was normal. The estimated ejection fraction was in the range of 55% to 60%. Left ventricular diastolic function parameters were normal. - Left atrium: The atrium was mildly dilated.  Antibiotics:    HPI/Subjective: Overall feeling better today. No chest pain. Breathing fine. No complaints of pain.  Sleeping: well Eating/GI: fine Breathing: fine Pain: none  Objective: Filed Vitals:   06/28/14 0430 06/28/14 0500 06/28/14 0526 06/28/14 0600  BP: 167/107   174/112  Pulse: 84   99  Temp:  99.7 F (37.6 C)    TempSrc:  Oral    Resp: 25   23  Height:  6' (1.829 m)    Weight:  138.7 kg (305 lb 12.5 oz) 138.5 kg (305 lb 5.4 oz)   SpO2: 99%   96%    Intake/Output Summary (Last 24 hours) at 06/28/14 0756 Last data filed at 06/28/14 8657  Gross per 24 hour  Intake      0 ml  Output   1800 ml  Net  -1800 ml     Filed Weights   06/28/14 0249 06/28/14 0500 06/28/14 0526  Weight: 153.769 kg (339 lb) 138.7 kg (305 lb 12.5 oz) 138.5 kg (305 lb 5.4 oz)    Exam:     Afebrile, hypertensive, heart rate 80-100s  General:  Appears calm and comfortable Cardiovascular: irregular, no m/r/g. No LE edema. Telemetry atrial fibrillation, normal rate  Respiratory: CTA bilaterally, no w/r/r. Normal respiratory effort. Psychiatric: grossly normal mood and affect, speech fluent and appropriate  Data Reviewed:  Urine output 1800   Pertinent data:  Admission Labs  Potassium 3.3. Complete metabolic panel unremarkable with exception of total bilirubin 2.0.  CBC unremarkable  Imaging   Chest x-ray with pulmonary vascular congestion and perihilar edema. Small pleural  effusions. Other  Urinalysis and urine drug screen unremarkable  EKG atrial fibrillation, rapid ventricular response, nonspecific ST changes  Pending data:    Scheduled Meds: . albuterol  2.5 mg Nebulization QHS   And  . ipratropium  0.5 mg Nebulization QHS  . aspirin EC  81 mg Oral Daily  . diltiazem  120 mg Oral Daily  . enoxaparin (LOVENOX) injection  40 mg Subcutaneous Q24H  . furosemide  40 mg Intravenous Q12H  . lisinopril  40 mg Oral Daily  . metoprolol succinate  25 mg Oral QODAY  . nitroGLYCERIN  1 inch Topical 4 times per day  . potassium chloride SA  20 mEq Oral Daily  . sodium chloride  3 mL Intravenous Q12H   Continuous Infusions: . diltiazem (CARDIZEM) infusion 7.5 mg/hr (06/28/14 1007)    Principal Problem:   Acute on chronic diastolic CHF (congestive heart failure) Active Problems:   OBESITY, MORBID   Essential hypertension   Sleep apnea   Noncompliance with medication regimen   Atrial fibrillation with RVR   Time spent 35 minutes

## 2014-06-28 NOTE — Care Management Utilization Note (Signed)
UR completed 
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
## 2014-06-28 NOTE — H&P (Signed)
PCP:   Fredirick Maudlin, MD   Chief Complaint:  Sob, swelling  HPI: 49 yo male with h/o diastolic heart failure, htn, PUD, sleep apnea, obesity comes in with over a week of ble swelling and gaining weight worse over last couple of days with dyspnea that really got untolerable tonight.  No fevers.  No cp.  Has not been taking his medications, h/o noncompliance.  He takes his meds about half the time because he says they dont work.    Review of Systems:  Positive and negative as per HPI otherwise all other systems are negative  Past Medical History: Past Medical History  Diagnosis Date  . Hypertensive heart disease     Hypertensive heart disease with prior episodes of CHF  . Hyperlipidemia   . Hypertension   . Gastroesophageal reflux disease   . Osteoarthritis   . Peptic ulcer disease   . Morbid obesity     /sleep apnea  . Allergic rhinitis     plus h/o asthma  . Tobacco abuse, in remission     Cigarettes discontinued in 2010  . CHF (congestive heart failure)   . A-fib     2015   Past Surgical History  Procedure Laterality Date  . None      Medications: Prior to Admission medications   Medication Sig Start Date End Date Taking? Authorizing Provider  albuterol-ipratropium (COMBIVENT) 18-103 MCG/ACT inhaler Inhale 2 puffs into the lungs every 6 (six) hours as needed for wheezing or shortness of breath. Patient not taking: Reported on 06/21/2014 04/04/14   Gwenyth Bender, NP  aspirin-sod bicarb-citric acid (ALKA-SELTZER) 325 MG TBEF tablet Take 325 mg by mouth daily. For pain in arm/chest    Historical Provider, MD  beclomethasone (QVAR) 80 MCG/ACT inhaler Inhale 1 puff into the lungs as needed (shortness of breath). Patient taking differently: Inhale 2 puffs into the lungs every morning.  04/04/14   Gwenyth Bender, NP  COMBIVENT RESPIMAT 20-100 MCG/ACT AERS respimat Inhale 2 puffs into the lungs at bedtime.  04/05/14   Historical Provider, MD  diltiazem (CARDIZEM CD) 120 MG  24 hr capsule Take 1 capsule (120 mg total) by mouth daily. Patient taking differently: Take 120 mg by mouth 2 (two) times a week.  04/04/14   Gwenyth Bender, NP  furosemide (LASIX) 20 MG tablet Take 1 tablet (20 mg total) by mouth daily. 04/05/14   Gwenyth Bender, NP  lisinopril (PRINIVIL,ZESTRIL) 40 MG tablet Take 1 tablet (40 mg total) by mouth daily. 05/25/14   [name], MD  metoprolol succinate (TOPROL-XL) 25 MG 24 hr tablet Take 25 mg by mouth every other day.  06/03/14   Historical Provider, MD  metoprolol succinate (TOPROL-XL) 50 MG 24 hr tablet Take 1 tablet (50 mg total) by mouth daily. Patient not taking: Reported on 06/21/2014 05/25/14   [name], MD  omeprazole (PRILOSEC) 20 MG capsule Take 1 capsule (20 mg total) by mouth daily. 06/21/14   Tiffany Kocher, PA-C  potassium chloride SA (K-DUR,KLOR-CON) 20 MEQ tablet Take 1 tablet (20 mEq total) by mouth daily. 04/05/14   Gwenyth Bender, NP    Allergies:   Allergies  Allergen Reactions  . Bee Venom Shortness Of Breath and Swelling  . Aspirin Other (See Comments)    Chewable 81 mg tablets upsets patients stomach    Social History:  reports that he quit smoking about 7 years ago. His smoking use included Cigarettes. He started smoking about  26 years ago. He has a 10 pack-year smoking history. He has never used smokeless tobacco. He reports that he does not drink alcohol or use illicit drugs.  Family History: Family History  Problem Relation Age of Onset  . Colon cancer Neg Hx   . Inflammatory bowel disease Neg Hx   . Liver disease Neg Hx     Physical Exam: Filed Vitals:   06/28/14 0249 06/28/14 0330 06/28/14 0345  BP: 172/112 169/121 186/121  Pulse: 103  99  Temp: 99.8 F (37.7 C)    TempSrc: Oral    Resp: Height: 6' (1.829 m)    Weight: 153.769 kg (339 lb)    SpO2: 95% 98% 98%   General appearance: alert, cooperative and no distress Head: Normocephalic, without obvious abnormality,  atraumatic Eyes: negative Nose: Nares normal. Septum midline. Mucosa normal. No drainage or sinus tenderness. Neck: no JVD and supple, symmetrical, trachea midline Lungs: clear to auscultation bilaterally Heart: irregularly irregular rhythm Abdomen: soft, non-tender; bowel sounds normal; no masses,  no organomegaly Extremities: edema 1+ Pulses: 2+ and symmetric Skin: Skin color, texture, turgor normal. No rashes or lesions Neurologic: Grossly normal    Labs on Admission:   Recent Labs  06/28/14 0309  NA 140  K 3.3*  CL 106  CO2 28  GLUCOSE 92  BUN 15  CREATININE 1.21  CALCIUM 8.7    Recent Labs  06/28/14 0309  AST 22  ALT 22  ALKPHOS 49  BILITOT 2.0*  PROT 6.2  ALBUMIN 3.9    Recent Labs  06/28/14 0309  WBC 5.8  NEUTROABS 4.4  HGB 13.6  HCT 41.7  MCV 87.4  PLT 202    Recent Labs  06/28/14 0309  TROPONINI 0.04*   Radiological Exams on Admission: Dg Chest Port 1 View  06/28/2014   CLINICAL DATA:  Shortness of breath this morning.  EXAM: PORTABLE CHEST - 1 VIEW  COMPARISON:  06/21/2014  FINDINGS: Cardiac enlargement with pulmonary vascular congestion. Developing hazy perihilar opacities suggesting developing edema. No blunting of costophrenic angles. No pneumothorax. No focal consolidation.  IMPRESSION: Cardiac enlargement with pulmonary vascular congestion and developing perihilar edema.   Electronically Signed   By: Burman Nieves M.D.   On: 06/28/2014 03:29    Assessment/Plan 49 yo male with noncompliance with acute on chronic diastolic chf exacerbation along with afib with rvr  Principal Problem:   CHF, acute on chronic diastolic-  Lasix 40 mg iv q 12 hours.  ntg paste.  Better bp and hr control.  Cont to serial cardiac enzymes, if repeat trop further elevated, start anticoagulation.  chf pathway.  Last echo in nov nml EF will not repeat.  Pt needs to take his meds.  Active Problems:   OBESITY, MORBID-  noted   Essential hypertension uncontolled  due to noncompliance -  Restart home bblocker and ccb   Sleep apnea-  Noted, intolerant of cpap   Noncompliance with medication regimen  - noted   Atrial fibrillation with RVR- dilt gtt. -  Resume home cardiac meds and wean gtt to goal HR 60-100.    Admit to stepdown.  Full code.  Pt instructed he needs to take his medications.    Zaquan Duffner A 06/28/2014, 4:16 AM

## 2014-06-29 LAB — COMPREHENSIVE METABOLIC PANEL
ALT: 21 U/L (ref 0–53)
AST: 19 U/L (ref 0–37)
Albumin: 3.5 g/dL (ref 3.5–5.2)
Alkaline Phosphatase: 43 U/L (ref 39–117)
Anion gap: 10 (ref 5–15)
BILIRUBIN TOTAL: 2.1 mg/dL — AB (ref 0.3–1.2)
BUN: 17 mg/dL (ref 6–23)
CALCIUM: 8.8 mg/dL (ref 8.4–10.5)
CO2: 23 mmol/L (ref 19–32)
CREATININE: 0.91 mg/dL (ref 0.50–1.35)
Chloride: 109 mEq/L (ref 96–112)
GLUCOSE: 84 mg/dL (ref 70–99)
Potassium: 3.3 mmol/L — ABNORMAL LOW (ref 3.5–5.1)
SODIUM: 142 mmol/L (ref 135–145)
Total Protein: 6.3 g/dL (ref 6.0–8.3)

## 2014-06-29 MED ORDER — DILTIAZEM HCL ER COATED BEADS 240 MG PO CP24
240.0000 mg | ORAL_CAPSULE | Freq: Every day | ORAL | Status: DC
  Administered 2014-06-30: 240 mg via ORAL
  Filled 2014-06-29: qty 1

## 2014-06-29 MED ORDER — POTASSIUM CHLORIDE CRYS ER 20 MEQ PO TBCR
40.0000 meq | EXTENDED_RELEASE_TABLET | Freq: Every day | ORAL | Status: DC
  Filled 2014-06-29: qty 2

## 2014-06-29 MED ORDER — DILTIAZEM HCL ER COATED BEADS 120 MG PO CP24
120.0000 mg | ORAL_CAPSULE | Freq: Once | ORAL | Status: AC
  Administered 2014-06-29: 120 mg via ORAL
  Filled 2014-06-29: qty 1

## 2014-06-29 NOTE — Progress Notes (Signed)
Called report to [place], RN on dept 300.  Verbalized understanding.  Pt transferred to rm [id number] in safe and stable condition. [place], Candelaria Stagers 06/29/2014

## 2014-06-29 NOTE — Progress Notes (Signed)
PROGRESS NOTE  KHASIR PEHL LTJ:[id number] DOB: Dec 22, 1965 DOA: 06/28/2014 PCP: Fredirick Maudlin, MD (has not yet established) Cardiologist: Dr. Darl Householder   Summary: 49 year old man with history of atrial fibrillation, chronic diastolic heart failure, ongoing noncompliance with medications presented with subacute shortness of breath over the last 4 weeks, initial evaluation suggested acute on chronic diastolic congestive heart failure and atrial fibrillation with rapid ventricular response. He was admitted for IV diuresis and rate control.  Assessment/Plan: 1. Acute on chronic diastolic congestive heart failure. Acute portion appears resolved. -2 L since admission. Normal LVEF, no with severe LVH by recent echocardiogram. Appears euvolemic. 2. Atrial fibrillation with rapid ventricular response. Overall improved . Heart rate controlled on oral metoprolol and diltiazem. 3. Accelerated hypertension superimposed on essential hypertension, secondary to noncompliance, resolved.   4. Non-compliance with medications, not an anticoagulation candidate 5. Hyperbilirubinemia. Significance unclear, no change, LFTs otherwise unremarkable. No abdominal pain. Gilbert's? 6. GERD 7. OSA 8. Morbid obesity 9. Tobacco dependence in remission   Overall much improved. Plan transferred to medical floor. Heart rate 90-1 10. Plan to increase Cardizem and monitor overnight.  Replete potassium. CMP and Mg in am. Fractionate bilirubin.  Transfer to medical floor  Anticipate discharge 1/9  Code Status: full code DVT prophylaxis: Lovenox Family Communication: none Disposition Plan: home  Brendia Sacks, MD  [facility]  Pager [phone] If 7PM-7AM, please contact night-coverage at [email or web address], password [facility] 06/29/2014, 9:57 AM  49: 1 day   Consultants:    Procedures:  Echocardiogram October 2015: Study Conclusions  - Procedure narrative: Transthoracic echocardiography. Image quality was  poor. The study was technically difficult, as a result of poor sound wave transmission and body habitus. - Left ventricle: The cavity size was normal. Wall thickness was increased in a pattern of severe LVH. Systolic function was normal. The estimated ejection fraction was in the range of 55% to 60%. Left ventricular diastolic function parameters were normal. - Left atrium: The atrium was mildly dilated.  Antibiotics:    HPI/Subjective: No issues charted overnight.  Feeling well. No chest pain or shortness of breath. No nausea or vomiting. Tolerating diet.  Objective: Filed Vitals:   06/29/14 0432 06/29/14 0600 06/29/14 0615 06/29/14 0829  BP:  139/100 138/89   Pulse:  90 95   Temp: 98.2 F (36.8 C)   98.4 F (36.9 C)  TempSrc: Oral   Oral  Resp:  20 21   Height:      Weight:      SpO2:  97% 96%     Intake/Output Summary (Last 24 hours) at 06/29/14 0957 Last data filed at 06/29/14 0829  Gross per 24 hour  Intake    730 ml  Output   1050 ml  Net   -320 ml     Filed Weights   06/28/14 0249 06/28/14 0500 06/28/14 0526  Weight: 153.769 kg (339 lb) 138.7 kg (305 lb 12.5 oz) 138.5 kg (305 lb 5.4 oz)    Exam:     Afebrile, hypertensive, heart rate 80-100s  General:  Appears comfortable, calm. Cardiovascular: Irregular, normal rate, no murmur, rub or gallop. No lower extremity edema. Telemetry: Atrial fibrillation, heart rate 90s Respiratory: Clear to auscultation bilaterally, no wheezes, rales or rhonchi. Normal respiratory effort. Psychiatric: grossly normal mood and affect, speech fluent and appropriate  Data Reviewed:  Urine output 1500  Potassium 3.3, complete metabolic panel otherwise unremarkable  Total bilirubin 2.1  Pertinent data: Admission Labs  Potassium 3.3. Complete metabolic panel unremarkable with exception  of total bilirubin 2.0.  CBC unremarkable  Imaging   Chest x-ray with pulmonary vascular congestion and perihilar  edema. Small pleural effusions. Other  Urinalysis and urine drug screen unremarkable  EKG atrial fibrillation, rapid ventricular response, nonspecific ST changes  Pending data:    Scheduled Meds: . albuterol  2.5 mg Nebulization QHS   And  . ipratropium  0.5 mg Nebulization QHS  . aspirin EC  81 mg Oral Daily  . diltiazem  120 mg Oral Daily  . enoxaparin (LOVENOX) injection  70 mg Subcutaneous Q24H  . furosemide  20 mg Oral Daily  . lisinopril  40 mg Oral Daily  . metoprolol succinate  25 mg Oral Daily  . potassium chloride SA  20 mEq Oral Daily  . sodium chloride  3 mL Intravenous Q12H   Continuous Infusions: . diltiazem (CARDIZEM) infusion 5 mg/hr (06/28/14 1100)    Principal Problem:   Acute on chronic diastolic CHF (congestive heart failure) Active Problems:   OBESITY, MORBID   Essential hypertension   Sleep apnea   Noncompliance with medication regimen   Atrial fibrillation with RVR   Time spent 20 minutes

## 2014-06-30 LAB — BILIRUBIN, FRACTIONATED(TOT/DIR/INDIR)
BILIRUBIN INDIRECT: 1.4 mg/dL — AB (ref 0.3–0.9)
Bilirubin, Direct: 0.4 mg/dL — ABNORMAL HIGH (ref 0.0–0.3)
Total Bilirubin: 1.8 mg/dL — ABNORMAL HIGH (ref 0.3–1.2)

## 2014-06-30 LAB — MAGNESIUM: Magnesium: 2 mg/dL (ref 1.5–2.5)

## 2014-06-30 MED ORDER — IPRATROPIUM-ALBUTEROL 0.5-2.5 (3) MG/3ML IN SOLN: 3.0000 mL | Freq: Every day | RESPIRATORY_TRACT | Status: DC

## 2014-06-30 MED ORDER — ASPIRIN 81 MG PO TBEC: 81.0000 mg | DELAYED_RELEASE_TABLET | Freq: Every day | ORAL | Status: DC

## 2014-06-30 MED ORDER — DILTIAZEM HCL ER COATED BEADS 240 MG PO CP24: 240.0000 mg | ORAL_CAPSULE | Freq: Every day | ORAL | Status: DC

## 2014-06-30 NOTE — Progress Notes (Signed)
Patient discharged home today with self care.  Patient was given discharge instructions, prescriptions, and care notes for low sodium, heart healthy diet, and heart failure.   The importance of medication compliance and daily weights were stressed.  Patient verbalized understanding with no complaints or concerns voiced at this time.  IV was removed with catheter intact, no bleeding or complications.  Patient left unit in stable condition, ambulating, with a staff member.

## 2014-06-30 NOTE — Discharge Summary (Signed)
Physician Discharge Summary  [name] WJX:[id number] DOB: 16-Apr-1966 DOA: 06/28/2014  PCP: Samuel Maudlin, MD (has not yet seen). Plans to make an appointment soon.  Admit date: 06/28/2014 Discharge date: 06/30/2014  Recommendations for Outpatient Follow-up:  1. Chronic diastolic congestive heart failure with poor compliance 2. Atrial fibrillation, not currently anticoagulated secondary to poor compliance 3. Hypertension poorly controlled secondary to noncompliance 4. Modest indirect hyperbilirubinemia of unclear significance    Follow-up Information    Follow up with Samuel L, MD In 3 weeks.   Specialty:  Pulmonary Disease   Contact information:   [address] [place] [address] [phone]       Follow up with Samuel Docker A, MD. Schedule an appointment as soon as possible for Fitzpatrick visit in 1 week.   Specialty:  Cardiology   Contact information:   [address] [place] [address] [phone]      Discharge Diagnoses:  1. Acute on chronic diastolic just of heart failure 2. Atrial fibrillation with rapid ventricular response 3. Accelerated hypertension superimposed on essential hypertension 4. Noncompliance with medications 5. Modest indirect hyperbilirubinemia 6. Morbid obesity 7. Tobacco dependence in remission  Discharge Condition: improved Disposition: home  Diet recommendation: heart healthy  Filed Weights   06/28/14 0526 06/29/14 0900 06/30/14 0634  Weight: 138.5 kg (305 lb 5.4 oz) 137.893 kg (304 lb) 137.2 kg (302 lb 7.5 oz)    History of present illness:  49 year old man with history of atrial fibrillation, chronic diastolic heart failure, ongoing noncompliance with medications presented with subacute shortness of breath over the last 4 weeks, initial evaluation suggested acute on chronic diastolic congestive heart failure and atrial fibrillation with rapid ventricular response. He was admitted for IV diuresis and rate  control.   Hospital Course:  [name] rapidly improved with resolution of acute heart failure, transitioned successfully to oral diuretics. Heart rate control was achieved and Cardizem was titrated upwards. Blood pressure stable on discharge.   1. Acute on chronic diastolic congestive heart failure, secondary to noncompliance with medications. Acute component resolved. Euvolemic. Normal LVEF, severe LVH October 2015. 2. Atrial fibrillation with rapid ventricular response. Controlled. Continue oral metoprolol and diltiazem. 3. Accelerated hypertension superimposed on essential hypertension, secondary to noncompliance with medications, resolved. 4. Noncompliance with medications, not an anti-coagulation candidate at this point. 5. Modest indirect hyperbilirubinemia. LFTs unremarkable, asymptomatic. Suspect Gilbert's.  6. Morbid obesity 7. Tobacco dependence in remission  Consultants:  none  Procedures:  Echocardiogram October 2015: Study Conclusions  - Procedure narrative: Transthoracic echocardiography. Image quality was poor. The study was technically difficult, as Fitzpatrick result of poor sound wave transmission and body habitus. - Left ventricle: The cavity size was normal. Wall thickness was increased in Fitzpatrick pattern of severe LVH. Systolic function was normal. The estimated ejection fraction was in the range of 55% to 60%. Left ventricular diastolic function parameters were normal. - Left atrium: The atrium was mildly dilated.  Discharge Instructions  Discharge Instructions    (HEART FAILURE PATIENTS) Call MD:  Anytime you have any of the following symptoms: 1) 3 pound weight gain in 24 hours or 5 pounds in 1 week 2) shortness of breath, with or without Fitzpatrick dry hacking cough 3) swelling in the hands, feet or stomach 4) if you have to sleep on extra pillows at night in order to breathe.    Complete by:  As directed      Activity as tolerated - No restrictions  Complete by:   As directed      Diet - low sodium heart healthy    Complete by:  As directed      Discharge instructions    Complete by:  As directed   Call your physician or seek immediate medical attention for swelling, weight gain, shortness of breath or worsening of condition.          Current Discharge Medication List    START taking these medications   Details  aspirin EC 81 MG EC tablet Take 1 tablet (81 mg total) by mouth daily.      CONTINUE these medications which have CHANGED   Details  diltiazem (CARDIZEM CD) 240 MG 24 hr capsule Take 1 capsule (240 mg total) by mouth daily. Qty: 30 capsule, Refills: 1      CONTINUE these medications which have NOT CHANGED   Details  albuterol-ipratropium (COMBIVENT) 18-103 MCG/ACT inhaler Inhale 2 puffs into the lungs every 6 (six) hours as needed for wheezing or shortness of breath. Qty: 1 Inhaler, Refills: o    beclomethasone (QVAR) 80 MCG/ACT inhaler Inhale 1 puff into the lungs as needed (shortness of breath). Qty: 1 Inhaler, Refills: 12    furosemide (LASIX) 20 MG tablet Take 1 tablet (20 mg total) by mouth daily. Qty: 30 tablet, Refills: 0    lisinopril (PRINIVIL,ZESTRIL) 40 MG tablet Take 1 tablet (40 mg total) by mouth daily. Qty: 30 tablet, Refills: 6    metoprolol succinate (TOPROL-XL) 25 MG 24 hr tablet Take 25 mg by mouth at bedtime.  Refills: 2    omeprazole (PRILOSEC) 20 MG capsule Take 1 capsule (20 mg total) by mouth daily. Qty: 30 capsule, Refills: 11    potassium chloride SA (K-DUR,KLOR-CON) 20 MEQ tablet Take 1 tablet (20 mEq total) by mouth daily. Qty: 30 tablet, Refills: 0      STOP taking these medications     diltiazem (DILACOR XR) 120 MG 24 hr capsule      aspirin-sod bicarb-citric acid (ALKA-SELTZER) 325 MG TBEF tablet        Allergies  Allergen Reactions  . Bee Venom Shortness Of Breath and Swelling  . Aspirin Other (See Comments)    Chewable 81 mg tablets upsets patients stomach    The results of  significant diagnostics from this hospitalization (including imaging, microbiology, ancillary and laboratory) are listed below for reference.    Significant Diagnostic Studies: Dg Chest Port 1 View  06/28/2014   CLINICAL DATA:  Shortness of breath this morning.  EXAM: PORTABLE CHEST - 1 VIEW  COMPARISON:  06/21/2014  FINDINGS: Cardiac enlargement with pulmonary vascular congestion. Developing hazy perihilar opacities suggesting developing edema. No blunting of costophrenic angles. No pneumothorax. No focal consolidation.  IMPRESSION: Cardiac enlargement with pulmonary vascular congestion and developing perihilar edema.   Electronically Signed   By: Burman Nieves M.D.   On: 06/28/2014 03:29    Microbiology: Recent Results (from the past 240 hour(s))  MRSA PCR Screening     Status: None   Collection Time: 06/28/14  5:08 AM  Result Value Ref Range Status   MRSA by PCR NEGATIVE NEGATIVE Final    Comment:        The GeneXpert MRSA Assay (FDA approved for NASAL specimens only), is one component of Fitzpatrick comprehensive MRSA colonization surveillance program. It is not intended to diagnose MRSA infection nor to guide or monitor treatment for MRSA infections.      Labs: Basic Metabolic Panel:  Recent Labs Lab  06/28/14 0309 06/29/14 0515 06/30/14 0625  NA 140 142  --   K 3.3* 3.3*  --   CL 106 109  --   CO2 28 23  --   GLUCOSE 92 84  --   BUN 15 17  --   CREATININE 1.21 0.91  --   CALCIUM 8.7 8.8  --   MG  --   --  2.0   Liver Function Tests:  Recent Labs Lab 06/28/14 0309 06/29/14 0515 06/30/14 0625  AST 22 19  --   ALT 22 21  --   ALKPHOS 49 43  --   BILITOT 2.0* 2.1* 1.8*  PROT 6.2 6.3  --   ALBUMIN 3.9 3.5  --    CBC:  Recent Labs Lab 06/28/14 0309  WBC 5.8  NEUTROABS 4.4  HGB 13.6  HCT 41.7  MCV 87.4  PLT 202   Cardiac Enzymes:  Recent Labs Lab 06/28/14 0309 06/28/14 0547 06/28/14 1200 06/28/14 1708  TROPONINI 0.04* 0.03 0.03 0.03    Principal  Problem:   Acute on chronic diastolic CHF (congestive heart failure) Active Problems:   OBESITY, MORBID   Essential hypertension   Sleep apnea   Noncompliance with medication regimen   Atrial fibrillation with RVR   Time coordinating discharge: 35 minutes  Signed:  Brendia Sacks, MD [facility] 06/30/2014, 3:38 PM

## 2014-06-30 NOTE — Progress Notes (Signed)
PROGRESS NOTE  [name]:[id number] DOB: 1966/02/20 DOA: 06/28/2014 PCP: Fredirick Maudlin, MD (has not yet established) Cardiologist: Dr. Darl Householder   Summary: 49 year old man with history of atrial fibrillation, chronic diastolic heart failure, ongoing noncompliance with medications presented with subacute shortness of breath over the last 4 weeks, initial evaluation suggested acute on chronic diastolic congestive heart failure and atrial fibrillation with rapid ventricular response. He was admitted for IV diuresis and rate control. Diuretics were rapidly transition to oral therapy and rate controlled with oral medications.  Assessment/Plan: 1. Acute on chronic diastolic congestive heart failure, secondary to noncompliance with medications. Acute component resolved. Euvolemic. Normal LVEF, severe LVH October 2015. 2. Atrial fibrillation with rapid ventricular response. Controlled. Continue oral metoprolol and diltiazem. 3. Accelerated hypertension superimposed on essential hypertension, secondary to noncompliance with medications, resolved. 4. Noncompliance with medications, not an anti-coagulation candidate at this point. 5. Modest indirect hyperbilirubinemia. LFTs unremarkable, asymptomatic. Suspect Gilbert's.  6. Morbid obesity 7. Tobacco dependence in remission   Plan discharge home today.  Close follow-up with cardiology. Stress compliance with medications, daily weights.  Follow-up modest elevation of total bilirubin if clinically indicated.  Code Status: full code DVT prophylaxis: Lovenox Family Communication: none Disposition Plan: home  Brendia Sacks, MD  [facility]  Pager [phone] If 7PM-7AM, please contact night-coverage at [email or web address], password [facility] 06/30/2014, 12:08 PM  LOS: 2 days   Consultants:    Procedures:  Echocardiogram October 2015: Study Conclusions  - Procedure narrative: Transthoracic echocardiography. Image quality was poor.  The study was technically difficult, as a result of poor sound wave transmission and body habitus. - Left ventricle: The cavity size was normal. Wall thickness was increased in a pattern of severe LVH. Systolic function was normal. The estimated ejection fraction was in the range of 55% to 60%. Left ventricular diastolic function parameters were normal. - Left atrium: The atrium was mildly dilated.  Antibiotics:    HPI/Subjective: Feels better, no CP, no SOB, wants to go home.  Objective: Filed Vitals:   06/29/14 2004 06/29/14 2206 06/30/14 0634 06/30/14 0940  BP:  149/95 156/100 141/103  Pulse:  84 73 97  Temp:  98.7 F (37.1 C) 98.3 F (36.8 C) 99 F (37.2 C)  TempSrc:  Oral Oral Oral  Resp:  Height:      Weight:   137.2 kg (302 lb 7.5 oz)   SpO2: 95% 97% 98% 96%    Intake/Output Summary (Last 24 hours) at 06/30/14 1208 Last data filed at 06/30/14 0900  Gross per 24 hour  Intake    680 ml  Output      0 ml  Net    680 ml     Filed Weights   06/28/14 0526 06/29/14 0900 06/30/14 0634  Weight: 138.5 kg (305 lb 5.4 oz) 137.893 kg (304 lb) 137.2 kg (302 lb 7.5 oz)    Exam:     Afebrile, no hypoxia, vital signs stable. Heart rate controlled. General:  Appears calm and comfortable Cardiovascular: RRR, no m/r/g. 1+ bilateral LE edema. Telemetry: atrial fibrillation Respiratory: CTA bilaterally, no w/r/r. Normal respiratory effort. Psychiatric: grossly normal mood and affect, speech fluent and appropriate  Data Reviewed:  Total bilirubin has decreased, 1.8. Predominantly indirect. Suspect Sullivan Lone.  Pertinent data: Admission Labs  Potassium 3.3. Complete metabolic panel unremarkable with exception of total bilirubin 2.0.  CBC unremarkable  Imaging   Chest x-ray with pulmonary vascular congestion and perihilar edema. Small pleural effusions. Other  Urinalysis  and urine drug screen unremarkable  EKG atrial fibrillation, rapid  ventricular response, nonspecific ST changes  Pending data:    Scheduled Meds: . aspirin EC  81 mg Oral Daily  . diltiazem  240 mg Oral Daily  . enoxaparin (LOVENOX) injection  70 mg Subcutaneous Q24H  . furosemide  20 mg Oral Daily  . ipratropium-albuterol  3 mL Nebulization QHS  . lisinopril  40 mg Oral Daily  . metoprolol succinate  25 mg Oral Daily  . potassium chloride SA  40 mEq Oral Daily  . sodium chloride  3 mL Intravenous Q12H   Continuous Infusions:    Principal Problem:   Acute on chronic diastolic CHF (congestive heart failure) Active Problems:   OBESITY, MORBID   Essential hypertension   Sleep apnea   Noncompliance with medication regimen   Atrial fibrillation with RVR

## 2014-07-03 NOTE — Progress Notes (Signed)
cc'ed to pcp °

## 2014-07-06 ENCOUNTER — Telehealth: Payer: Self-pay | Admitting: Gastroenterology

## 2014-07-06 NOTE — Telephone Encounter (Signed)
Please offer patient a follow-up office visit. I reviewed his ER workup which occur the day I saw him. No DVT. However since that time he was admitted to the hospital with CHF.  We need to see him back in the office prior to scheduling him for upper endoscopy and colonoscopy given recent admission.

## 2014-07-10 ENCOUNTER — Encounter: Payer: Self-pay | Admitting: Internal Medicine

## 2014-07-10 NOTE — Telephone Encounter (Signed)
APPOINTMENT MADE AND LETTER SENT °

## 2014-07-10 NOTE — Telephone Encounter (Signed)
[name], please offer pt an ov.

## 2014-07-18 ENCOUNTER — Other Ambulatory Visit (HOSPITAL_COMMUNITY): Payer: Medicare Other

## 2014-08-04 IMAGING — CR DG CHEST 2V
2 series · 2 of 2 positions shown · non-contrast
Comparison: 08/27/2008

CLINICAL DATA: Productive cough.  Weakness.  Fever.

CHEST - 2 VIEW

[view not recorded (1 of 2)]
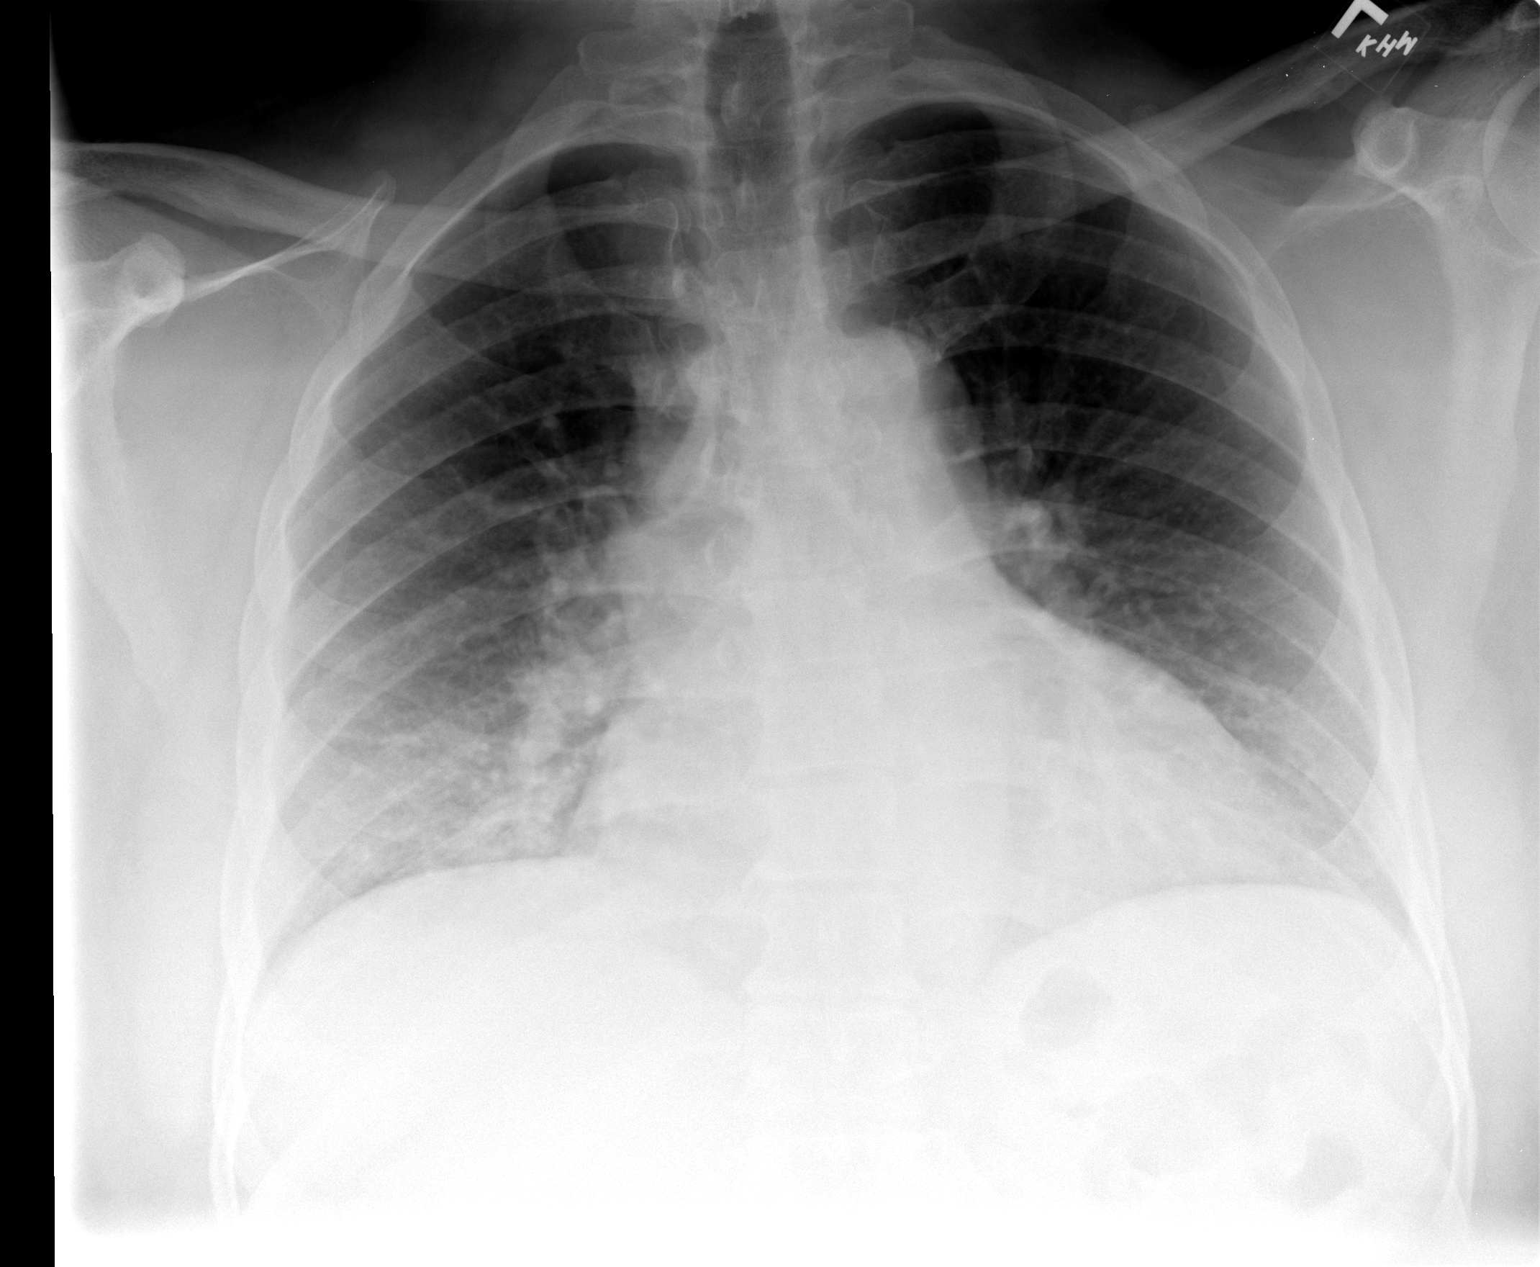

[view not recorded (2 of 2)]
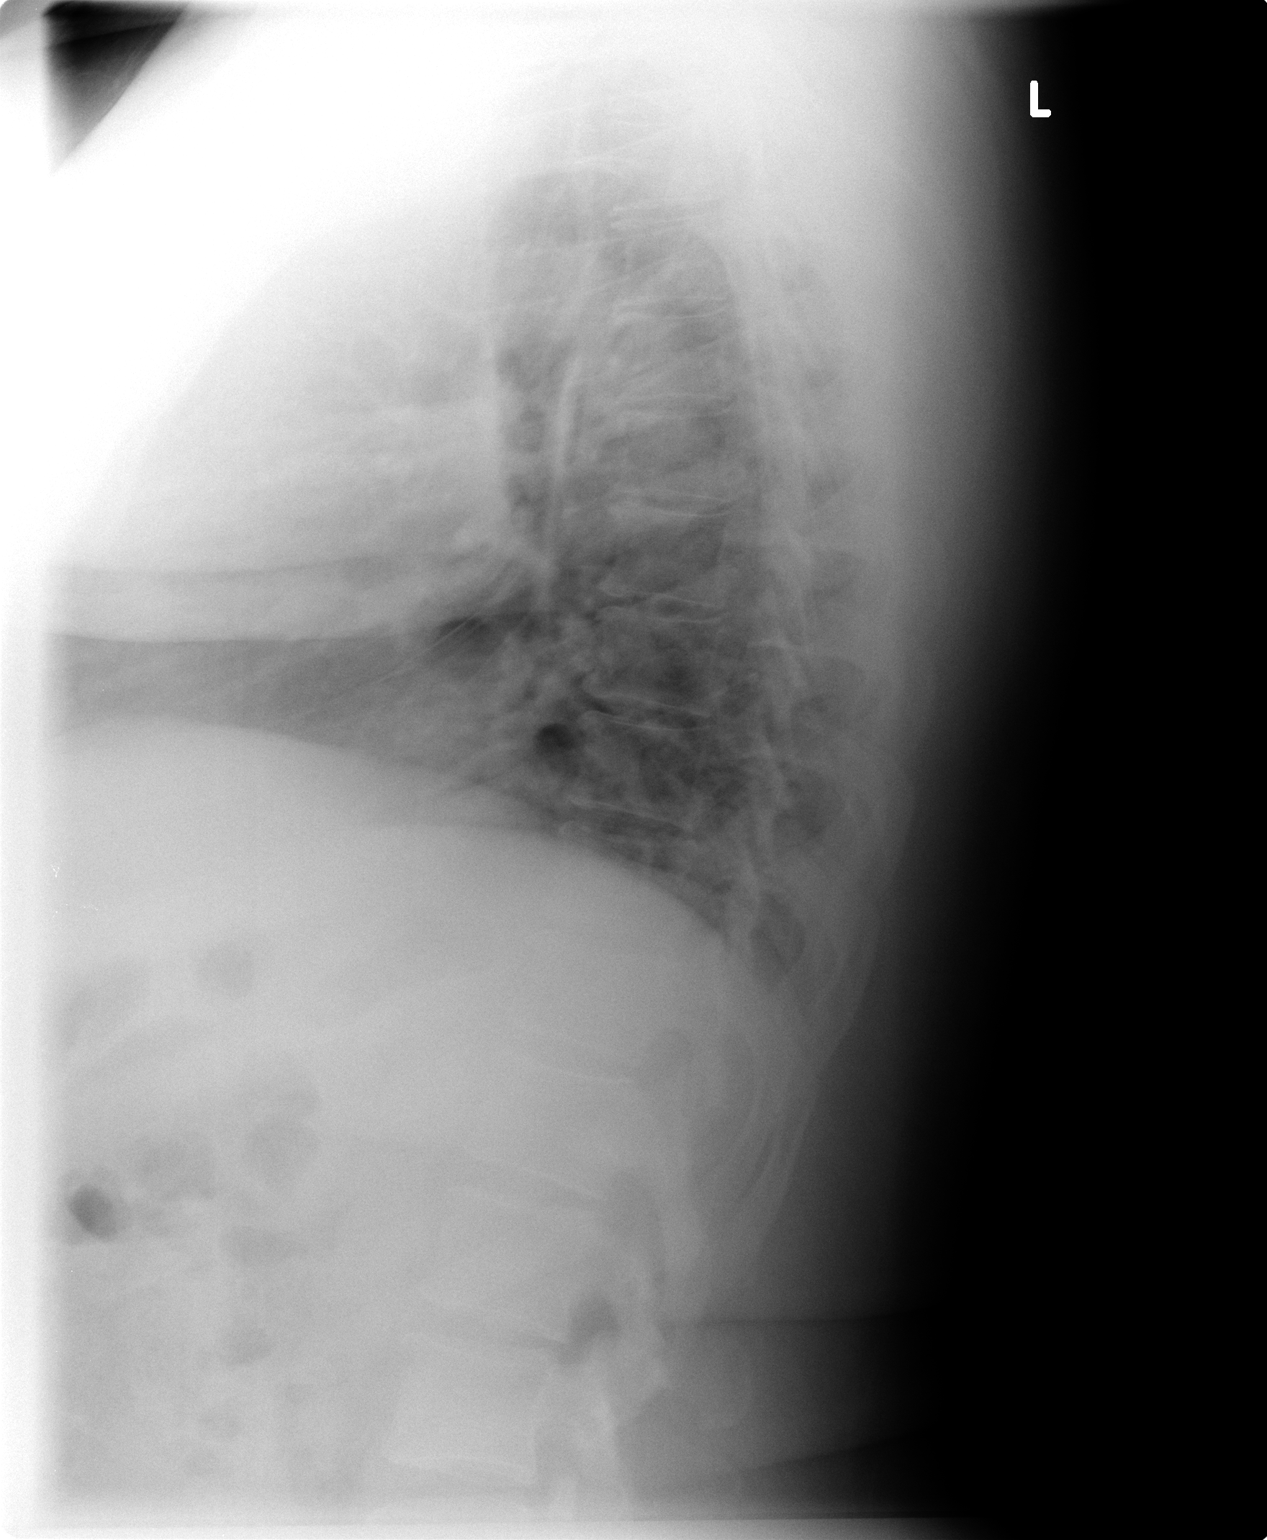

[2 of 2 positions shown; findings below may reference images not displayed]

FINDINGS: Heart is enlarged.  There is minimal patchy density at
the right lung base, raising the question of early infiltrate.  No
evidence for pulmonary edema.  There are degenerative changes in
the thoracic spine.
IMPRESSION: 1.  Cardiomegaly without pulmonary edema.
2.  Right lower lobe infiltrate suspected.

## 2014-08-07 ENCOUNTER — Telehealth: Payer: Self-pay | Admitting: Gastroenterology

## 2014-08-07 ENCOUNTER — Encounter: Payer: Self-pay | Admitting: Gastroenterology

## 2014-08-07 ENCOUNTER — Ambulatory Visit: Payer: Medicare Other | Admitting: Gastroenterology

## 2014-08-07 NOTE — Telephone Encounter (Signed)
PATIENT WAS A NO SHOW AND LETTER SENT  °

## 2014-08-08 ENCOUNTER — Encounter (HOSPITAL_COMMUNITY): Payer: Self-pay | Admitting: *Deleted

## 2014-08-08 ENCOUNTER — Inpatient Hospital Stay (HOSPITAL_COMMUNITY)
Admission: EM | Admit: 2014-08-08 | Discharge: 2014-08-10 | DRG: 292 | Disposition: A | Discharge status: Home / Self Care | Payer: Medicare Other | Attending: Internal Medicine | Admitting: Internal Medicine

## 2014-08-08 ENCOUNTER — Emergency Department (HOSPITAL_COMMUNITY): Payer: Medicare Other

## 2014-08-08 DIAGNOSIS — Z9114 Patient's other noncompliance with medication regimen: Secondary | ICD-10-CM | POA: Diagnosis present

## 2014-08-08 DIAGNOSIS — I5032 Chronic diastolic (congestive) heart failure: Secondary | ICD-10-CM | POA: Insufficient documentation

## 2014-08-08 DIAGNOSIS — Z79899 Other long term (current) drug therapy: Secondary | ICD-10-CM | POA: Diagnosis not present

## 2014-08-08 DIAGNOSIS — I5043 Acute on chronic combined systolic (congestive) and diastolic (congestive) heart failure: Secondary | ICD-10-CM | POA: Diagnosis present

## 2014-08-08 DIAGNOSIS — E785 Hyperlipidemia, unspecified: Secondary | ICD-10-CM | POA: Diagnosis present

## 2014-08-08 DIAGNOSIS — I10 Essential (primary) hypertension: Secondary | ICD-10-CM | POA: Diagnosis present

## 2014-08-08 DIAGNOSIS — G4730 Sleep apnea, unspecified: Secondary | ICD-10-CM | POA: Diagnosis present

## 2014-08-08 DIAGNOSIS — E662 Morbid (severe) obesity with alveolar hypoventilation: Secondary | ICD-10-CM

## 2014-08-08 DIAGNOSIS — I4891 Unspecified atrial fibrillation: Secondary | ICD-10-CM | POA: Diagnosis present

## 2014-08-08 DIAGNOSIS — Z87891 Personal history of nicotine dependence: Secondary | ICD-10-CM

## 2014-08-08 DIAGNOSIS — K219 Gastro-esophageal reflux disease without esophagitis: Secondary | ICD-10-CM | POA: Diagnosis present

## 2014-08-08 DIAGNOSIS — I160 Hypertensive urgency: Secondary | ICD-10-CM

## 2014-08-08 DIAGNOSIS — Z6841 Body Mass Index (BMI) 40.0 and over, adult: Secondary | ICD-10-CM | POA: Diagnosis not present

## 2014-08-08 DIAGNOSIS — I110 Hypertensive heart disease with heart failure: Principal | ICD-10-CM | POA: Diagnosis present

## 2014-08-08 DIAGNOSIS — R0602 Shortness of breath: Secondary | ICD-10-CM | POA: Diagnosis present

## 2014-08-08 DIAGNOSIS — I5030 Unspecified diastolic (congestive) heart failure: Secondary | ICD-10-CM

## 2014-08-08 DIAGNOSIS — E876 Hypokalemia: Secondary | ICD-10-CM | POA: Diagnosis present

## 2014-08-08 DIAGNOSIS — M1990 Unspecified osteoarthritis, unspecified site: Secondary | ICD-10-CM | POA: Diagnosis present

## 2014-08-08 DIAGNOSIS — J45909 Unspecified asthma, uncomplicated: Secondary | ICD-10-CM | POA: Diagnosis present

## 2014-08-08 DIAGNOSIS — I5022 Chronic systolic (congestive) heart failure: Secondary | ICD-10-CM | POA: Insufficient documentation

## 2014-08-08 DIAGNOSIS — Z7982 Long term (current) use of aspirin: Secondary | ICD-10-CM

## 2014-08-08 DIAGNOSIS — I509 Heart failure, unspecified: Secondary | ICD-10-CM

## 2014-08-08 LAB — CBC
HCT: 40.8 % (ref 39.0–52.0)
HEMOGLOBIN: 13.3 g/dL (ref 13.0–17.0)
MCH: 28.2 pg (ref 26.0–34.0)
MCHC: 32.6 g/dL (ref 30.0–36.0)
MCV: 86.6 fL (ref 78.0–100.0)
Platelets: 253 10*3/uL (ref 150–400)
RBC: 4.71 MIL/uL (ref 4.22–5.81)
RDW: 13.1 % (ref 11.5–15.5)
WBC: 4.4 10*3/uL (ref 4.0–10.5)

## 2014-08-08 LAB — RAPID URINE DRUG SCREEN, HOSP PERFORMED
Amphetamines: NOT DETECTED
Barbiturates: NOT DETECTED
Benzodiazepines: NOT DETECTED
Cocaine: NOT DETECTED
OPIATES: NOT DETECTED
TETRAHYDROCANNABINOL: NOT DETECTED

## 2014-08-08 LAB — BRAIN NATRIURETIC PEPTIDE: B NATRIURETIC PEPTIDE 5: 244 pg/mL — AB (ref 0.0–100.0)

## 2014-08-08 LAB — BASIC METABOLIC PANEL
ANION GAP: 3 — AB (ref 5–15)
BUN: 16 mg/dL (ref 6–23)
CO2: 28 mmol/L (ref 19–32)
Calcium: 8.8 mg/dL (ref 8.4–10.5)
Chloride: 109 mmol/L (ref 96–112)
Creatinine, Ser: 1.06 mg/dL (ref 0.50–1.35)
GFR, EST NON AFRICAN AMERICAN: 82 mL/min — AB (ref >90)
Glucose, Bld: 97 mg/dL (ref 70–99)
POTASSIUM: 3.5 mmol/L (ref 3.5–5.1)
Sodium: 140 mmol/L (ref 135–145)

## 2014-08-08 LAB — URINALYSIS, ROUTINE W REFLEX MICROSCOPIC
BILIRUBIN URINE: NEGATIVE
GLUCOSE, UA: NEGATIVE mg/dL
Hgb urine dipstick: NEGATIVE
Ketones, ur: NEGATIVE mg/dL
LEUKOCYTES UA: NEGATIVE
NITRITE: NEGATIVE
Protein, ur: NEGATIVE mg/dL
Specific Gravity, Urine: 1.01 (ref 1.005–1.030)
Urobilinogen, UA: 0.2 mg/dL (ref 0.0–1.0)
pH: 6 (ref 5.0–8.0)

## 2014-08-08 LAB — TROPONIN I: Troponin I: 0.03 ng/mL (ref <0.031)

## 2014-08-08 MED ORDER — ONDANSETRON HCL 4 MG PO TABS: 4.0000 mg | ORAL_TABLET | Freq: Four times a day (QID) | ORAL | Status: DC | PRN

## 2014-08-08 MED ORDER — FUROSEMIDE 10 MG/ML IJ SOLN
80.0000 mg | Freq: Once | INTRAMUSCULAR | Status: AC
  Administered 2014-08-08: 80 mg via INTRAVENOUS
  Filled 2014-08-08: qty 8

## 2014-08-08 MED ORDER — HEPARIN SODIUM (PORCINE) 5000 UNIT/ML IJ SOLN
5000.0000 [IU] | Freq: Three times a day (TID) | INTRAMUSCULAR | Status: DC
  Administered 2014-08-08 – 2014-08-10 (×5): 5000 [IU] via SUBCUTANEOUS
  Filled 2014-08-08 (×5): qty 1

## 2014-08-08 MED ORDER — SODIUM CHLORIDE 0.9 % IV SOLN: INTRAVENOUS | Status: AC

## 2014-08-08 MED ORDER — FLUTICASONE PROPIONATE HFA 44 MCG/ACT IN AERO
2.0000 | INHALATION_SPRAY | Freq: Two times a day (BID) | RESPIRATORY_TRACT | Status: DC
Start: 2014-08-08 — End: 2014-08-10
  Administered 2014-08-09 – 2014-08-10 (×2): 2 via RESPIRATORY_TRACT
  Filled 2014-08-08: qty 10.6

## 2014-08-08 MED ORDER — ONDANSETRON HCL 4 MG/2ML IJ SOLN: 4.0000 mg | Freq: Four times a day (QID) | INTRAMUSCULAR | Status: DC | PRN

## 2014-08-08 MED ORDER — METOPROLOL TARTRATE 1 MG/ML IV SOLN
5.0000 mg | Freq: Once | INTRAVENOUS | Status: AC
  Administered 2014-08-08: 5 mg via INTRAVENOUS
  Filled 2014-08-08: qty 5

## 2014-08-08 MED ORDER — POTASSIUM CHLORIDE CRYS ER 20 MEQ PO TBCR
20.0000 meq | EXTENDED_RELEASE_TABLET | Freq: Every day | ORAL | Status: DC
  Administered 2014-08-08: 20 meq via ORAL
  Filled 2014-08-08 (×2): qty 1

## 2014-08-08 MED ORDER — PANTOPRAZOLE SODIUM 40 MG PO TBEC
40.0000 mg | DELAYED_RELEASE_TABLET | Freq: Every day | ORAL | Status: DC
  Administered 2014-08-08 – 2014-08-10 (×3): 40 mg via ORAL
  Filled 2014-08-08 (×3): qty 1

## 2014-08-08 MED ORDER — DILTIAZEM HCL ER COATED BEADS 240 MG PO CP24
240.0000 mg | ORAL_CAPSULE | Freq: Every day | ORAL | Status: DC
  Administered 2014-08-08 – 2014-08-10 (×3): 240 mg via ORAL
  Filled 2014-08-08 (×3): qty 1

## 2014-08-08 MED ORDER — FUROSEMIDE 10 MG/ML IJ SOLN
20.0000 mg | Freq: Two times a day (BID) | INTRAMUSCULAR | Status: DC
Start: 2014-08-09 — End: 2014-08-10
  Administered 2014-08-09 – 2014-08-10 (×3): 20 mg via INTRAVENOUS
  Filled 2014-08-08 (×3): qty 2

## 2014-08-08 MED ORDER — LISINOPRIL 10 MG PO TABS
40.0000 mg | ORAL_TABLET | Freq: Every day | ORAL | Status: DC
  Administered 2014-08-08 – 2014-08-10 (×3): 40 mg via ORAL
  Filled 2014-08-08 (×3): qty 4

## 2014-08-08 MED ORDER — HYDRALAZINE HCL 20 MG/ML IJ SOLN
5.0000 mg | INTRAMUSCULAR | Status: DC | PRN
  Administered 2014-08-10: 5 mg via INTRAVENOUS
  Filled 2014-08-08: qty 1

## 2014-08-08 MED ORDER — METOPROLOL SUCCINATE ER 50 MG PO TB24
50.0000 mg | ORAL_TABLET | Freq: Every day | ORAL | Status: DC
  Administered 2014-08-08 – 2014-08-09 (×2): 50 mg via ORAL
  Filled 2014-08-08 (×2): qty 1

## 2014-08-08 MED ORDER — SODIUM CHLORIDE 0.9 % IV SOLN: INTRAVENOUS | Status: DC

## 2014-08-08 MED ORDER — ACETAMINOPHEN 325 MG PO TABS: 650.0000 mg | ORAL_TABLET | Freq: Four times a day (QID) | ORAL | Status: DC | PRN

## 2014-08-08 MED ORDER — ACETAMINOPHEN 650 MG RE SUPP: 650.0000 mg | Freq: Four times a day (QID) | RECTAL | Status: DC | PRN

## 2014-08-08 MED ORDER — OXYCODONE HCL 5 MG PO TABS: 5.0000 mg | ORAL_TABLET | ORAL | Status: DC | PRN

## 2014-08-08 NOTE — H&P (Signed)
[facility] History and Physical  [name] ZOX:[id number] DOB: Feb 27, 1966 DOA: 08/08/2014  Referring physician: Dr Deretha Emory - [facility] PCP: Fredirick Maudlin, MD   Chief Complaint: Fatigue  HPI: [name] is a 49 y.o. male  3 weeks progressive shortness of breath and generalized weakness with significant worsening over the past 7 days. Associated with decreased appetite, lung congestion, intermittent productive cough and wheezing. Endorses occasional posttussive emesis. Symptoms worse at night. Symptoms are continuous. Symptoms are getting worse. Alka-Seltzer without relief. Patient reports not taking his Lasix for the past week, as he ran out of it. No blood pressure medications were taken today. Patient states that his leg swelling is at his baseline.   Review of Systems:  Constitutional: No weight loss, night sweats, Fevers, chills.  HEENT:  No headaches, Difficulty swallowing,Tooth/dental problems,Sore throat,  No sneezing, itching, ear ache, nasal congestion, post nasal drip,  Cardio-vascular:  No chest pain, Orthopnea, PND,  dizziness, palpitations  GI:  No heartburn, indigestion, abdominal pain, nausea, vomiting, diarrhea, change in bowel habits. Resp:  Per HPI Skin:  no rash or lesions.  GU:  no dysuria, change in color of urine, no urgency or frequency. No flank pain.  Musculoskeletal:   No joint pain or swelling. No decreased range of motion. No back pain.  Psych:  No change in mood or affect. No depression or anxiety. No memory loss.   Past Medical History  Diagnosis Date  . Hypertensive heart disease     Hypertensive heart disease with prior episodes of CHF  . Hyperlipidemia   . Hypertension   . Gastroesophageal reflux disease   . Osteoarthritis   . Peptic ulcer disease   . Morbid obesity     /sleep apnea  . Allergic rhinitis     plus h/o asthma  . Tobacco abuse, in remission     Cigarettes discontinued in 2010  . CHF (congestive heart failure)    . A-fib     2015   Past Surgical History  Procedure Laterality Date  . None     Social History:  reports that he quit smoking about 7 years ago. His smoking use included Cigarettes. He started smoking about 26 years ago. He has a 10 pack-year smoking history. He has never used smokeless tobacco. He reports that he does not drink alcohol or use illicit drugs.  Allergies  Allergen Reactions  . Bee Venom Shortness Of Breath and Swelling  . Aspirin Other (See Comments)    Chewable 81 mg tablets upsets patients stomach    Family History  Problem Relation Age of Onset  . Colon cancer Neg Hx   . Inflammatory bowel disease Neg Hx   . Liver disease Neg Hx      Prior to Admission medications   Medication Sig Start Date End Date Taking? Authorizing Provider  beclomethasone (QVAR) 80 MCG/ACT inhaler Inhale 1 puff into the lungs as needed (shortness of breath). Patient taking differently: Inhale 2 puffs into the lungs every morning.  04/04/14  Yes Lesle Chris Black, NP  diltiazem (CARDIZEM CD) 240 MG 24 hr capsule Take 1 capsule (240 mg total) by mouth daily. 06/30/14  Yes Standley Brooking, MD  furosemide (LASIX) 20 MG tablet Take 1 tablet (20 mg total) by mouth daily. 04/05/14  Yes Lesle Chris Black, NP  lisinopril (PRINIVIL,ZESTRIL) 40 MG tablet Take 1 tablet (40 mg total) by mouth daily. Patient taking differently: Take 40 mg by mouth every other day.  05/25/14  Yes  [name], MD  metoprolol succinate (TOPROL-XL) 50 MG 24 hr tablet Take 50 mg by mouth at bedtime. 06/22/14  Yes Historical Provider, MD  omeprazole (PRILOSEC) 20 MG capsule Take 1 capsule (20 mg total) by mouth daily. 06/21/14  Yes Tiffany Kocher, PA-C  potassium chloride SA (K-DUR,KLOR-CON) 20 MEQ tablet Take 1 tablet (20 mEq total) by mouth daily. Patient taking differently: Take 20 mEq by mouth at bedtime.  04/05/14  Yes Gwenyth Bender, NP  albuterol-ipratropium (COMBIVENT) 18-103 MCG/ACT inhaler Inhale 2 puffs into the lungs  every 6 (six) hours as needed for wheezing or shortness of breath. 04/04/14   Gwenyth Bender, NP  aspirin EC 81 MG EC tablet Take 1 tablet (81 mg total) by mouth daily. Patient not taking: Reported on 08/08/2014 06/30/14   Standley Brooking, MD   Physical Exam: Filed Vitals:   08/08/14 1715 08/08/14 1733 08/08/14 1804 08/08/14 1842  BP: 166/105 195/128 162/107   Pulse: 81 76 77   Temp: 98.1 F (36.7 C)   98.1 F (36.7 C)  TempSrc: Oral   Oral  Resp: 18 17    Height:      Weight:      SpO2: 100% 99% 97%     Wt Readings from Last 3 Encounters:  08/08/14 153.769 kg (339 lb)  06/30/14 137.2 kg (302 lb 7.5 oz)  06/21/14 142.883 kg (315 lb)    General:  Appears calm and comfortable ENT: dry mucus membranes Neck:  no LAD, masses or thyromegaly Cardiovascular:  RRR, no m/r/g. 2+ LE edema bilat Telemetry: Afib Respiratory: Diminished breath sounds in bases w/ crackles , nml effort.  Abdomen:  soft, ntnd Skin:  no rash or induration seen on limited exam Musculoskeletal:  grossly normal tone BUE/BLE Psychiatric:  grossly normal mood and affect, speech fluent and appropriate Neurologic:  grossly non-focal.          Labs on Admission:  Basic Metabolic Panel:  Recent Labs Lab 08/08/14 1137  NA 140  K 3.5  CL 109  CO2 28  GLUCOSE 97  BUN 16  CREATININE 1.06  CALCIUM 8.8   Liver Function Tests: No results for input(s): AST, ALT, ALKPHOS, BILITOT, PROT, ALBUMIN in the last 168 hours. No results for input(s): LIPASE, AMYLASE in the last 168 hours. No results for input(s): AMMONIA in the last 168 hours. CBC:  Recent Labs Lab 08/08/14 1137  WBC 4.4  HGB 13.3  HCT 40.8  MCV 86.6  PLT 253   Cardiac Enzymes:  Recent Labs Lab 08/08/14 1137  TROPONINI 0.03    BNP (last 3 results)  Recent Labs  06/21/14 1811 06/28/14 0309 08/08/14 1137  BNP 306.0* 396.0* 244.0*    ProBNP (last 3 results)  Recent Labs  04/02/14 1031  PROBNP 746.5*    CBG: No results  for input(s): GLUCAP in the last 168 hours.  Radiological Exams on Admission: Dg Chest Portable 1 View  08/08/2014   CLINICAL DATA:  Productive cough for 3 months  EXAM: PORTABLE CHEST - 1 VIEW  COMPARISON:  06/28/2014  FINDINGS: Cardiac shadow is enlarged. Mild vascular congestion is noted but improved from the prior study. No parenchymal edema is noted. No infiltrate is seen.  IMPRESSION: Mild vascular congestion without focal infiltrate or edema.   Electronically Signed   By: Alcide Clever M.D.   On: 08/08/2014 12:51    EKG: Independently reviewed. Afib. No sign of ACS  Assessment/Plan Principal Problem:   Shortness of  breath Active Problems:   Asthma   CHF (congestive heart failure)   A-fib   Hypertensive urgency   Obesity hypoventilation syndrome   GERD (gastroesophageal reflux disease)  Shortness of breath: Likely multifactorial - Afib, mild CHF exacerbation, COPD?, and obesity hypoventilation syndrome. 20pack year smoking history (quit 2009). No CP - Admit  - CHF, Afib management as below - consider formal PFTs after discharge - continue QVAR daily and PRN albuterol - Tele - cycle troponins  CHF: BNP 244 (not diagnostic of exacerbation). Pt worsened off Lasix. Last Echo 03/2014 w/ EF 55-60%. 2+ LE pitting edema. Lasix 80 IV in ED (home dose 20 po). - Lasix 20mg  IV - continue bblocker and ACEi - continue K - BMET in am - Strict I/O - Daily wts  HTN: Hypertensive urgency. Question pts medical compliance.  - Continue Hom Metop, Lisinopril, and dilt. - Hydralazine PRN SBP > 180 and DBP > 110  Afib: rate controlled. Intermittent. Suspect a great deal of pts recent fatigue related to Afib. Troponin negative. Pt states he is aware that he has a Dx of Afib but has never been offered anticoagulation. This is in direct contrast to multiple physician notes indicating pt has been offered but is non-compliant. QUestion pts mental capacity vs impairment - Tele - Continue Dilt and  Metop - cont Asa - UDS  GERD: - continue protonix  Code Status: FULL DVT Prophylaxis: Hep Family Communication: None Disposition Plan: pending improvement in BP and SOB   Brayley Mackowiak J, MD Family Medicine [facility] [email or web address] Password [email or web address]

## 2014-08-08 NOTE — ED Provider Notes (Signed)
CSN: [id number]     Arrival date & time 08/08/14  1038 History  This chart was scribed for Vanetta Mulders, MD by Abel Presto, ED Scribe. This patient was seen in room APA01/APA01 and the patient's care was started at 1:20 PM.    Chief Complaint  Patient presents with  . Fatigue     The history is provided by the patient. No language interpreter was used.   HPI Comments: [name] is a 49 y.o. male with PMHx of HLD, HTN, GERD, osteoarthritis, peptic ulcer disease, morbid obesity, allergic rhinitis, tobacco abuse in remission, CHF, Afib, and hypertensive heart disease who presents to the Emergency Department complaining of intermittent SOB and generalized weakness for 3 months worsening a week ago. Pt notes decreased appetite, congestion, palpitations and productive cough and wheezing at night with vomiting due to pertussis last night. Pt has been taking [employer] for relief. Pt was seen in ED for SOB and CHF in 06/2014. Pt has not been taking his Lasix for a week. Pt has not taken his BP medication since last night. Pt notes leg swelling at baseline, and recent rash on ankles. Pt denies being on blood thinners. Pt state he was seeing a cardiologist at [facility] but has not been there in a long time. Pt denies chest pain, back pain, fever and chills. Pt's PCP is Dr. Juanetta Gosling.   Past Medical History  Diagnosis Date  . Hypertensive heart disease     Hypertensive heart disease with prior episodes of CHF  . Hyperlipidemia   . Hypertension   . Gastroesophageal reflux disease   . Osteoarthritis   . Peptic ulcer disease   . Morbid obesity     /sleep apnea  . Allergic rhinitis     plus h/o asthma  . Tobacco abuse, in remission     Cigarettes discontinued in 2010  . CHF (congestive heart failure)   . A-fib     2015   Past Surgical History  Procedure Laterality Date  . None     Family History  Problem Relation Age of Onset  . Colon cancer Neg Hx   . Inflammatory bowel disease Neg  Hx   . Liver disease Neg Hx    History  Substance Use Topics  . Smoking status: Former Smoker -- 0.50 packs/day for 20 years    Types: Cigarettes    Start date: 04/26/1988    Quit date: 06/23/2007  . Smokeless tobacco: Never Used     Comment: 1 ppd former smoker  . Alcohol Use: No     Comment: no etoh since 2009    Review of Systems  Constitutional: Positive for appetite change. Negative for fever and chills.  HENT: Positive for congestion.   Eyes: Negative for visual disturbance.  Respiratory: Positive for cough, shortness of breath and wheezing.   Cardiovascular: Positive for palpitations and leg swelling (baseline). Negative for chest pain.  Gastrointestinal: Positive for vomiting and diarrhea. Negative for nausea and abdominal distention.  Genitourinary: Negative for dysuria.  Musculoskeletal: Negative for back pain.  Skin: Positive for rash.  Neurological: Positive for weakness. Negative for headaches.  Hematological: Does not bruise/bleed easily.  Psychiatric/Behavioral: Negative for confusion.      Allergies  Bee venom and Aspirin  Home Medications   Prior to Admission medications   Medication Sig Start Date End Date Taking? Authorizing Provider  beclomethasone (QVAR) 80 MCG/ACT inhaler Inhale 1 puff into the lungs as needed (shortness of breath). Patient taking differently: Inhale  2 puffs into the lungs every morning.  04/04/14  Yes Lesle Chris Black, NP  diltiazem (CARDIZEM CD) 240 MG 24 hr capsule Take 1 capsule (240 mg total) by mouth daily. 06/30/14  Yes Standley Brooking, MD  furosemide (LASIX) 20 MG tablet Take 1 tablet (20 mg total) by mouth daily. 04/05/14  Yes Lesle Chris Black, NP  lisinopril (PRINIVIL,ZESTRIL) 40 MG tablet Take 1 tablet (40 mg total) by mouth daily. Patient taking differently: Take 40 mg by mouth every other day.  05/25/14  Yes [name], MD  metoprolol succinate (TOPROL-XL) 50 MG 24 hr tablet Take 50 mg by mouth at bedtime. 06/22/14  Yes  Historical Provider, MD  omeprazole (PRILOSEC) 20 MG capsule Take 1 capsule (20 mg total) by mouth daily. 06/21/14  Yes Tiffany Kocher, PA-C  potassium chloride SA (K-DUR,KLOR-CON) 20 MEQ tablet Take 1 tablet (20 mEq total) by mouth daily. Patient taking differently: Take 20 mEq by mouth at bedtime.  04/05/14  Yes Gwenyth Bender, NP  albuterol-ipratropium (COMBIVENT) 18-103 MCG/ACT inhaler Inhale 2 puffs into the lungs every 6 (six) hours as needed for wheezing or shortness of breath. 04/04/14   Gwenyth Bender, NP  aspirin EC 81 MG EC tablet Take 1 tablet (81 mg total) by mouth daily. Patient not taking: Reported on 08/08/2014 06/30/14   Standley Brooking, MD   BP 166/105 mmHg  Pulse 81  Temp(Src) 98.1 F (36.7 C) (Oral)  Resp 18  Ht 6' (1.829 m)  Wt 339 lb (153.769 kg)  BMI 45.97 kg/m2  SpO2 100% Physical Exam  Constitutional: He is oriented to person, place, and time. He appears well-developed and well-nourished.  HENT:  Head: Normocephalic.  Eyes: Conjunctivae and EOM are normal. Pupils are equal, round, and reactive to light. No scleral icterus.  Neck: Normal range of motion. Neck supple.  Cardiovascular: Normal rate.  An irregular rhythm present.  No murmur heard. Pulmonary/Chest: Effort normal and breath sounds normal. No respiratory distress. He has no wheezes. He has no rales.  Abdominal: Soft. Bowel sounds are normal. There is no tenderness.  Musculoskeletal: Normal range of motion. He exhibits edema (pitting bilaterally).  Neurological: He is alert and oriented to person, place, and time. No cranial nerve deficit. He exhibits normal muscle tone. Coordination normal.  Skin: Skin is warm and dry.  Psychiatric: He has a normal mood and affect. His behavior is normal.  Nursing note and vitals reviewed.   ED Course  Procedures (including critical care time) DIAGNOSTIC STUDIES: Oxygen Saturation is 98% on room air, normal by my interpretation.    COORDINATION OF CARE: 1:31 PM  Discussed treatment plan with patient at beside, the patient agrees with the plan and has no further questions at this time.   Labs Review Labs Reviewed  BRAIN NATRIURETIC PEPTIDE - Abnormal; Notable for the following:    B Natriuretic Peptide 244.0 (*)    All other components within normal limits  BASIC METABOLIC PANEL - Abnormal; Notable for the following:    GFR calc non Af Amer 82 (*)    Anion gap 3 (*)    All other components within normal limits  CBC  TROPONIN I  URINALYSIS, ROUTINE W REFLEX MICROSCOPIC   Results for orders placed or performed during the hospital encounter of 08/08/14  CBC  Result Value Ref Range   WBC 4.4 4.0 - 10.5 K/uL   RBC 4.71 4.22 - 5.81 MIL/uL   Hemoglobin 13.3 13.0 - 17.0 g/dL  HCT 40.8 39.0 - 52.0 %   MCV 86.6 78.0 - 100.0 fL   MCH 28.2 26.0 - 34.0 pg   MCHC 32.6 30.0 - 36.0 g/dL   RDW 40.9 81.1 - 91.4 %   Platelets 253 150 - 400 K/uL  BNP (order ONLY if patient complains of dyspnea/SOB AND you have documented it for THIS visit)  Result Value Ref Range   B Natriuretic Peptide 244.0 (H) 0.0 - 100.0 pg/mL  Basic metabolic panel  Result Value Ref Range   Sodium 140 135 - 145 mmol/L   Potassium 3.5 3.5 - 5.1 mmol/L   Chloride 109 96 - 112 mmol/L   CO2 28 19 - 32 mmol/L   Glucose, Bld 97 70 - 99 mg/dL   BUN 16 6 - 23 mg/dL   Creatinine, Ser 7.82 0.50 - 1.35 mg/dL   Calcium 8.8 8.4 - 95.6 mg/dL   GFR calc non Af Amer 82 (L) >90 mL/min   GFR calc Af Amer >90 >90 mL/min   Anion gap 3 (L) 5 - 15  Troponin I  Result Value Ref Range   Troponin I 0.03 <0.031 ng/mL  Urinalysis, Routine w reflex microscopic  Result Value Ref Range   Color, Urine YELLOW YELLOW   APPearance CLEAR CLEAR   Specific Gravity, Urine 1.010 1.005 - 1.030   pH 6.0 5.0 - 8.0   Glucose, UA NEGATIVE NEGATIVE mg/dL   Hgb urine dipstick NEGATIVE NEGATIVE   Bilirubin Urine NEGATIVE NEGATIVE   Ketones, ur NEGATIVE NEGATIVE mg/dL   Protein, ur NEGATIVE NEGATIVE mg/dL    Urobilinogen, UA 0.2 0.0 - 1.0 mg/dL   Nitrite NEGATIVE NEGATIVE   Leukocytes, UA NEGATIVE NEGATIVE     Imaging Review Dg Chest Portable 1 View  08/08/2014   CLINICAL DATA:  Productive cough for 3 months  EXAM: PORTABLE CHEST - 1 VIEW  COMPARISON:  06/28/2014  FINDINGS: Cardiac shadow is enlarged. Mild vascular congestion is noted but improved from the prior study. No parenchymal edema is noted. No infiltrate is seen.  IMPRESSION: Mild vascular congestion without focal infiltrate or edema.   Electronically Signed   By: Alcide Clever M.D.   On: 08/08/2014 12:51     EKG Interpretation   Date/Time:  Wednesday August 08 2014 12:05:00 EST Ventricular Rate:  98 PR Interval:    QRS Duration: 88 QT Interval:  385 QTC Calculation: 492 R Axis:   55 Text Interpretation:  Atrial fibrillation Borderline T wave abnormalities  Borderline prolonged QT interval Confirmed by Jakhia Buxton  MD, Ysabel Stankovich [id number])  on 08/08/2014 1:14:26 PM      MDM   Final diagnoses:  Acute congestive heart failure, unspecified congestive heart failure type  Atrial fibrillation, unspecified  Essential hypertension   Patient's chest x-ray consistent with pulmonary edema. Patient's shortness of breath improved with 80 mg of Lasix. Blood pressures are still significantly elevated systolic 166 diastolic 105. Patient will be started on Lopressor. Patient also in atrial fib rate controlled. She is not on any blood thinners. Discussed with hospitalist and discussed with Dr. Juanetta Gosling patient will be admitted to telemetry.  Since patient's shortness of breath improved with the Lasix patient was not started on nitroglycerin drip.  I personally performed the services described in this documentation, which was scribed in my presence. The recorded information has been reviewed and is accurate.     Vanetta Mulders, MD 08/08/14 1725

## 2014-08-08 NOTE — ED Notes (Signed)
Generalized weakness x 1 week, which has worsened. Vomiting. Last vomited last night or night before last. Decreased appetite. States he feels SOB at night, having to sit up.

## 2014-08-09 DIAGNOSIS — I482 Chronic atrial fibrillation: Secondary | ICD-10-CM

## 2014-08-09 DIAGNOSIS — I5032 Chronic diastolic (congestive) heart failure: Secondary | ICD-10-CM

## 2014-08-09 DIAGNOSIS — I472 Ventricular tachycardia: Secondary | ICD-10-CM

## 2014-08-09 DIAGNOSIS — Z9114 Patient's other noncompliance with medication regimen: Secondary | ICD-10-CM

## 2014-08-09 DIAGNOSIS — J4520 Mild intermittent asthma, uncomplicated: Secondary | ICD-10-CM

## 2014-08-09 LAB — TROPONIN I: Troponin I: 0.03 ng/mL (ref <0.031)

## 2014-08-09 LAB — BASIC METABOLIC PANEL
Anion gap: 4 — ABNORMAL LOW (ref 5–15)
BUN: 14 mg/dL (ref 6–23)
CHLORIDE: 110 mmol/L (ref 96–112)
CO2: 26 mmol/L (ref 19–32)
CREATININE: 0.96 mg/dL (ref 0.50–1.35)
Calcium: 8.6 mg/dL (ref 8.4–10.5)
GFR calc Af Amer: >90 mL/min (ref >90)
GFR calc non Af Amer: >90 mL/min (ref >90)
Glucose, Bld: 83 mg/dL (ref 70–99)
Potassium: 3.4 mmol/L — ABNORMAL LOW (ref 3.5–5.1)
Sodium: 140 mmol/L (ref 135–145)

## 2014-08-09 MED ORDER — POTASSIUM CHLORIDE CRYS ER 20 MEQ PO TBCR
20.0000 meq | EXTENDED_RELEASE_TABLET | Freq: Two times a day (BID) | ORAL | Status: DC
  Administered 2014-08-09 – 2014-08-10 (×3): 20 meq via ORAL
  Filled 2014-08-09 (×2): qty 1

## 2014-08-09 MED ORDER — POTASSIUM CHLORIDE CRYS ER 20 MEQ PO TBCR: 20.0000 meq | EXTENDED_RELEASE_TABLET | Freq: Every day | ORAL | Status: DC

## 2014-08-09 MED ORDER — FLUTICASONE PROPIONATE HFA 44 MCG/ACT IN AERO
INHALATION_SPRAY | RESPIRATORY_TRACT | Status: AC
  Filled 2014-08-09: qty 10.6

## 2014-08-09 NOTE — Consult Note (Signed)
CARDIOLOGY CONSULT NOTE  Patient ID: [name] MRN: [id number] DOB/AGE: 1966/01/03 49 y.o.  Admit date: 08/08/2014 Primary Physician HAWKINS,EDWARD L, MD  Reason for Consultation: SOB, hypertension  HPI: [name] is 49 yr old male well known to me from clinic. He has a long history of medication noncompliance. He ran out of some of his medications two weeks ago due to cost. I most recently saw him on 05/25/14 and he had been doing well at that time and admitted to taking his medications. He has a history of atrial fibrillation, chronic diastolic heart failure, essential hypertension, and nonsustained VT. He has normal left ventricular systolic function and severe LVH. He has chronic leg swelling.  He presented with progressive shortness of breath and weakness over the past 2 weeks and was found to be markedly hypertensive, BP 195/128. CXR showed mild vascular congestion. BNP mildly elevated at 244. Troponins were normal. ECG showed atrial fibrillation, 98 bpm.  He was diuresed with low-dose IV Lasix and started back on his outpatient medication regimen and now he says he is feeling well and wants to go home. He has put out 2 liters of fluid. BP has normalized. He denies chest pain and shortness of breath. Leg swelling at baseline.   Allergies  Allergen Reactions  . Bee Venom Shortness Of Breath and Swelling  . Aspirin Other (See Comments)    Chewable 81 mg tablets upsets patients stomach    Current Facility-Administered Medications  Medication Dose Route Frequency Provider Last Rate Last Dose  . 0.9 %  sodium chloride infusion   Intravenous Continuous Vanetta Mulders, MD      . 0.9 %  sodium chloride infusion   Intravenous STAT Vanetta Mulders, MD      . acetaminophen (TYLENOL) tablet 650 mg  650 mg Oral Q6H PRN Ozella Rocks, MD       Or  . acetaminophen (TYLENOL) suppository 650 mg  650 mg Rectal Q6H PRN Ozella Rocks, MD      . diltiazem (CARDIZEM CD) 24 hr  capsule 240 mg  240 mg Oral Daily Ozella Rocks, MD   240 mg at 08/09/14 0825  . fluticasone (FLOVENT HFA) 44 MCG/ACT inhaler 2 puff  2 puff Inhalation BID Ozella Rocks, MD   2 puff at 08/08/14 2000  . furosemide (LASIX) injection 20 mg  20 mg Intravenous BID Ozella Rocks, MD   20 mg at 08/09/14 1324  . heparin injection 5,000 Units  5,000 Units Subcutaneous 3 times per day Ozella Rocks, MD   5,000 Units at 08/09/14 1419  . hydrALAZINE (APRESOLINE) injection 5 mg  5 mg Intravenous Q4H PRN Ozella Rocks, MD      . lisinopril (PRINIVIL,ZESTRIL) tablet 40 mg  40 mg Oral Daily Ozella Rocks, MD   40 mg at 08/09/14 0825  . metoprolol succinate (TOPROL-XL) 24 hr tablet 50 mg  50 mg Oral QHS Ozella Rocks, MD   50 mg at 08/08/14 2021  . ondansetron (ZOFRAN) tablet 4 mg  4 mg Oral Q6H PRN Ozella Rocks, MD       Or  . ondansetron [facility]) injection 4 mg  4 mg Intravenous Q6H PRN Ozella Rocks, MD      . oxyCODONE (Oxy IR/ROXICODONE) immediate release tablet 5 mg  5 mg Oral Q4H PRN Ozella Rocks, MD      . pantoprazole (PROTONIX) EC tablet 40 mg  40 mg  Oral Daily Ozella Rocks, MD   40 mg at 08/09/14 1610  . potassium chloride SA (K-DUR,KLOR-CON) CR tablet 20 mEq  20 mEq Oral BID Fredirick Maudlin, MD   20 mEq at 08/09/14 0825    Past Medical History  Diagnosis Date  . Hypertensive heart disease     Hypertensive heart disease with prior episodes of CHF  . Hyperlipidemia   . Hypertension   . Gastroesophageal reflux disease   . Osteoarthritis   . Peptic ulcer disease   . Morbid obesity     /sleep apnea  . Allergic rhinitis     plus h/o asthma  . Tobacco abuse, in remission     Cigarettes discontinued in 2010  . CHF (congestive heart failure)   . A-fib     2015    Past Surgical History  Procedure Laterality Date  . None      History   Social History  . Marital Status: Divorced    Spouse Name: N/A  . Number of Children: 3  . Years of Education: N/A    Occupational History  . disabled    Social History Main Topics  . Smoking status: Former Smoker -- 0.50 packs/day for 20 years    Types: Cigarettes    Start date: 04/26/1988    Quit date: 06/23/2007  . Smokeless tobacco: Never Used     Comment: 1 ppd former smoker  . Alcohol Use: No     Comment: no etoh since 2009  . Drug Use: No  . Sexual Activity: No   Other Topics Concern  . Not on file   Social History Narrative     No family history of premature CAD in 1st degree relatives.  Prior to Admission medications   Medication Sig Start Date End Date Taking? Authorizing Provider  beclomethasone (QVAR) 80 MCG/ACT inhaler Inhale 1 puff into the lungs as needed (shortness of breath). Patient taking differently: Inhale 2 puffs into the lungs every morning.  04/04/14  Yes Lesle Chris Black, NP  diltiazem (CARDIZEM CD) 240 MG 24 hr capsule Take 1 capsule (240 mg total) by mouth daily. 06/30/14  Yes Standley Brooking, MD  furosemide (LASIX) 20 MG tablet Take 1 tablet (20 mg total) by mouth daily. 04/05/14  Yes Lesle Chris Black, NP  lisinopril (PRINIVIL,ZESTRIL) 40 MG tablet Take 1 tablet (40 mg total) by mouth daily. Patient taking differently: Take 40 mg by mouth every other day.  05/25/14  Yes [name], MD  metoprolol succinate (TOPROL-XL) 50 MG 24 hr tablet Take 50 mg by mouth at bedtime. 06/22/14  Yes Historical Provider, MD  omeprazole (PRILOSEC) 20 MG capsule Take 1 capsule (20 mg total) by mouth daily. 06/21/14  Yes Tiffany Kocher, PA-C  potassium chloride SA (K-DUR,KLOR-CON) 20 MEQ tablet Take 1 tablet (20 mEq total) by mouth daily. Patient taking differently: Take 20 mEq by mouth at bedtime.  04/05/14  Yes Gwenyth Bender, NP  albuterol-ipratropium (COMBIVENT) 18-103 MCG/ACT inhaler Inhale 2 puffs into the lungs every 6 (six) hours as needed for wheezing or shortness of breath. 04/04/14   Gwenyth Bender, NP  aspirin EC 81 MG EC tablet Take 1 tablet (81 mg total) by mouth  daily. Patient not taking: Reported on 08/08/2014 06/30/14   Standley Brooking, MD     Review of systems complete and found to be negative unless listed above in HPI     Physical exam Blood pressure 134/81, pulse 73, temperature  98 F (36.7 C), temperature source Oral, resp. rate 20, height 6' (1.829 m), weight 339 lb (153.769 kg), SpO2 98 %. General: NAD, morbidly obese. HEENT: Wearing sunglasses. Neck: No JVD, no thyromegaly. Lungs: Diminished sounds but no wheezes/rales. CV: Nondisplaced PMI. Irregular rhythm, normal S1/S2, no S3, no murmur. Trace bilateral pretibial and periankle edema.  Abdomen: Morbidly obese.  Neurologic: Alert and oriented.  Psych: Normal affect. Skin: Normal. Musculoskeletal: No gross deformities. Extremities: No clubbing or cyanosis.   ECG: Most recent ECG reviewed.  Labs:   Lab Results  Component Value Date   WBC 4.4 08/08/2014   HGB 13.3 08/08/2014   HCT 40.8 08/08/2014   MCV 86.6 08/08/2014   PLT 253 08/08/2014    Recent Labs Lab 08/09/14 0212  NA 140  K 3.4*  CL 110  CO2 26  BUN 14  CREATININE 0.96  CALCIUM 8.6  GLUCOSE 83   Lab Results  Component Value Date   CKTOTAL 378* 12/07/2007   CKMB 7.0* 12/07/2007   TROPONINI <0.03 08/09/2014    Lab Results  Component Value Date   CHOL 154 04/02/2014   CHOL 167 01/05/2011   CHOL 178 12/25/2008   Lab Results  Component Value Date   HDL 44 04/02/2014   HDL 45 01/05/2011   HDL 41 12/25/2008   Lab Results  Component Value Date   LDLCALC 98 04/02/2014   LDLCALC 108* 01/05/2011   LDLCALC 113* 12/25/2008   Lab Results  Component Value Date   TRIG 61 04/02/2014   TRIG 70 01/05/2011   TRIG 119 12/25/2008   Lab Results  Component Value Date   CHOLHDL 3.5 04/02/2014   CHOLHDL 3.7 01/05/2011   CHOLHDL 4.3 Ratio 12/25/2008   No results found for: LDLDIRECT       Studies: Dg Chest Portable 1 View  08/08/2014   CLINICAL DATA:  Productive cough for 3 months  EXAM:  PORTABLE CHEST - 1 VIEW  COMPARISON:  06/28/2014  FINDINGS: Cardiac shadow is enlarged. Mild vascular congestion is noted but improved from the prior study. No parenchymal edema is noted. No infiltrate is seen.  IMPRESSION: Mild vascular congestion without focal infiltrate or edema.   Electronically Signed   By: Alcide Clever M.D.   On: 08/08/2014 12:51    ASSESSMENT AND PLAN:  1. Hypertensive urgency: BP now normalized. I emphasized the importance of treatment compliance. 2. Atrial fibrillation: Rate controlled on long-acting diltiazem and metoprolol. While CHADSVASC score is 2 (CHF, HTN), he is not a suitable candidate for anticoagulation given his long and well documented history of medication compliance and thus potential for adverse bleeding events. 3. Chronic diastolic heart failure: BNP minimally elevated. Has had 2L output. I think he can be transitioned back to oral Lasix 20 mg daily. 4. Nonsustained VT: No chest pain. Normal LV systolic function. If he were to have recurrences, I would consider stress testing.  Dispo: No further recommendations. Suitable for discharge from my standpoint. I will arrange for outpatient follow up with me.  Signed: Prentice Docker, M.D., F.A.C.C.  08/09/2014, 3:59 PM

## 2014-08-09 NOTE — Progress Notes (Addendum)
Patient ID: [name], male   DOB: 08-08-1965, 49 y.o.   MRN: [id number] [facility] PROGRESS NOTE  [name] WUJ:[id number] DOB: 10-18-1965 DOA: 08/08/2014 PCP: Fredirick Maudlin, MD  Brief narrative:    49 y.o. male with history of atrial fibrillation but not on anticoagulation, history of chronic systolic and diastolic heart failure, last 2-D echo in 10/ 2015 with ejection fraction of 55%, obesity hypoventilation syndrome, hypertension who presented to [facility] ED with worsening shortness of breath and progressive weakness and fatigue over past 7 days prior to this admission. Patient also reported intermittent productive cough with wheezing. His symptoms were worse at night. He reports no significant leg swelling at least not more than his baseline. No reports of weight gain. On admission, blood pressure was 195/128 but subsequently improved to 134/81, heart rate 51 - 93, RR 16, T max 98.7 F and oxygen saturation 97% with nasal cannula oxygen support. Blood work revealed potassium of 3.4, troponin level was within normal limits. BMP was 244. The 12-lead EKG showed atrial fibrillation. [facility] asked to admit for further evaluation and management of shortness of breath and weakness.   Assessment/Plan:    Principal Problem: Shortness of breath / acute on chronic combined systolic and diastolic CHF - Last 2-D echo in October 2015 with ejection fraction of 55%. BNP on this admission is only mildly elevated at 244 compared with 03/2014 value of 746. - Chest x-ray on this admission showed mild vascular congestion without focal infiltrates or edema. - Patient was started on low-dose Lasix 20 mg IV twice a day with good diuresis. - Appreciate cardiology consult and recommendation. - Daily weight and strict intake and output. - Replete electrolytes as needed.  Active Problems: Atrial fibrillation - CHADSVASC score is 2 (CHF, HTN), per cardiology pt is not a suitable candidate for anticoagulation  given his long and well documented history of medication compliance and thus potential for adverse bleeding events - Rate controlled with Cardizem 240 mg daily and metoprolol 50 mg at bedtime  GERD - Continue PPI therapy  Hypokalemia - Secondary to Lasix - Being supplemented  History of asthma - Controlled. Continue Flovent inhaler twice daily  Essential hypertension / accelerated hypertension - On admission, blood pressure was 195/128. It subsequently improved to 134/81 - Continue Cardizem, lisinopril and metoprolol    DVT Prophylaxis  - Heparin subcutaneous ordered   Code Status: Full.  Family Communication:  plan of care discussed with the patient Disposition Plan: We will wait for input from cardiology. Will observe for next 24 hours and discharge likely tomorrow.  IV access:  Peripheral IV  Procedures and diagnostic studies:    Dg Chest Portable 1 View 08/08/2014    Mild vascular congestion without focal infiltrate or edema.     Medical Consultants:  Cardiology  Other Consultants:  None  IAnti-Infectives:   None   Manson Passey, MD  [facility] Pager [phone]  If 7PM-7AM, please contact night-coverage [email or web address] Password [facility] 08/09/2014, 10:56 AM   LOS: 1 day    HPI/Subjective: No acute overnight events.  Objective: Filed Vitals:   08/08/14 1842 08/08/14 2024 08/09/14 0532 08/09/14 0535  BP:  163/120 134/81   Pulse:  93 73   Temp: 98.1 F (36.7 C) 98.6 F (37 C) 98 F (36.7 C)   TempSrc: Oral Oral Oral   Resp:   20   Height:      Weight:    153.769 kg (339 lb)  SpO2:  98% 98%  Intake/Output Summary (Last 24 hours) at 08/09/14 1056 Last data filed at 08/09/14 0533  Gross per 24 hour  Intake      0 ml  Output   2000 ml  Net  -2000 ml    Exam:   General:  Pt is alert, follows commands appropriately, not in acute distress  Cardiovascular: Irregular rhythm, rate controlled, S1/S2, no murmurs  Respiratory: Clear to  auscultation bilaterally, no wheezing, no crackles, no rhonchi  Abdomen: Soft, non tender, non distended, bowel sounds present  Extremities: No edema, pulses DP and PT palpable bilaterally  Neuro: Grossly nonfocal  Data Reviewed: Basic Metabolic Panel:  Recent Labs Lab 08/08/14 1137 08/09/14 0212  NA 140 140  K 3.5 3.4*  CL 109 110  CO2 28 26  GLUCOSE 97 83  BUN 16 14  CREATININE 1.06 0.96  CALCIUM 8.8 8.6   Liver Function Tests: No results for input(s): AST, ALT, ALKPHOS, BILITOT, PROT, ALBUMIN in the last 168 hours. No results for input(s): LIPASE, AMYLASE in the last 168 hours. No results for input(s): AMMONIA in the last 168 hours. CBC:  Recent Labs Lab 08/08/14 1137  WBC 4.4  HGB 13.3  HCT 40.8  MCV 86.6  PLT 253   Cardiac Enzymes:  Recent Labs Lab 08/08/14 1137 08/08/14 1951 08/09/14 0212 08/09/14 0834  TROPONINI 0.03 <0.03 0.03 <0.03   BNP: Invalid input(s): POCBNP CBG: No results for input(s): GLUCAP in the last 168 hours.  No results found for this or any previous visit (from the past 240 hour(s)).   Scheduled Meds: . diltiazem  240 mg Oral Daily  . fluticasone  2 puff Inhalation BID  . furosemide  20 mg Intravenous BID  . heparin  5,000 Units Subcutaneous 3 times per day  . lisinopril  40 mg Oral Daily  . metoprolol succinate  50 mg Oral QHS  . pantoprazole  40 mg Oral Daily  . potassium chloride SA  20 mEq Oral BID   Continuous Infusions: . sodium chloride

## 2014-08-09 NOTE — Progress Notes (Signed)
He is listed as having me for his primary care physician but this is not correct. I do see him for pulmonary problems.  I do not know who his primary care doctor is.

## 2014-08-09 NOTE — Care Management Note (Addendum)
    Page 1 of 1   08/10/2014     12:19:28 PM CARE MANAGEMENT NOTE 08/10/2014  Patient:  [name], [name]   Account Number:  [id number]  Date Initiated:  08/09/2014  Documentation initiated by:  Sharrie Rothman  Subjective/Objective Assessment:   Pt admitted from home with CHF. Pt lives with his mother and will return home at discharge. Pt is independent with ADL's.     Action/Plan:   No Cm needs noted.   Anticipated DC Date:  08/11/2014   Anticipated DC Plan:  HOME/SELF CARE      DC Planning Services  CM consult      Choice offered to / List presented to:             Status of service:  Completed, signed off Medicare Important Message given?  YES (If response is "NO", the following Medicare IM given date fields will be blank) Date Medicare IM given:  08/10/2014 Medicare IM given by:  Sharrie Rothman Date Additional Medicare IM given:   Additional Medicare IM given by:    Discharge Disposition:  HOME/SELF CARE  Per UR Regulation:    If discussed at Long Length of Stay Meetings, dates discussed:    Comments:  08/10/14 1215 Arlyss Queen, RN BSN CM Pt discharged home today. No CM needs noted.   08/09/14 1200 Arlyss Queen, RN BSN CM

## 2014-08-09 NOTE — Progress Notes (Signed)
UR chart review completed.  

## 2014-08-10 DIAGNOSIS — I5032 Chronic diastolic (congestive) heart failure: Secondary | ICD-10-CM | POA: Insufficient documentation

## 2014-08-10 DIAGNOSIS — I5022 Chronic systolic (congestive) heart failure: Secondary | ICD-10-CM

## 2014-08-10 DIAGNOSIS — I509 Heart failure, unspecified: Secondary | ICD-10-CM

## 2014-08-10 MED ORDER — AMLODIPINE BESYLATE 5 MG PO TABS: 5.0000 mg | ORAL_TABLET | Freq: Every day | ORAL | Status: DC

## 2014-08-10 MED ORDER — HYDRALAZINE HCL 25 MG PO TABS: 25.0000 mg | ORAL_TABLET | Freq: Two times a day (BID) | ORAL | Status: DC

## 2014-08-10 MED ORDER — HYDRALAZINE HCL 25 MG PO TABS
25.0000 mg | ORAL_TABLET | Freq: Two times a day (BID) | ORAL | Status: DC
  Administered 2014-08-10: 25 mg via ORAL
  Filled 2014-08-10: qty 1

## 2014-08-10 MED ORDER — FUROSEMIDE 20 MG PO TABS: 20.0000 mg | ORAL_TABLET | Freq: Every day | ORAL | Status: DC

## 2014-08-10 MED ORDER — LISINOPRIL 40 MG PO TABS: 40.0000 mg | ORAL_TABLET | Freq: Every day | ORAL | Status: DC

## 2014-08-10 NOTE — Discharge Instructions (Signed)
Atrial Fibrillation  Atrial fibrillation is a condition that causes your heart to beat irregularly. It may also cause your heart to beat faster than normal. Atrial fibrillation can prevent your heart from pumping blood normally. It increases your risk of stroke and heart problems.  HOME CARE  · Take medications as told by your doctor.  · Only take medications that your doctor says are safe. Some medications can make the condition worse or happen again.  · If blood thinners were prescribed by your doctor, take them exactly as told. Too much can cause bleeding. Too little and you will not have the needed protection against stroke and other problems.  · Perform blood tests at home if told by your doctor.  · Perform blood tests exactly as told by your doctor.  · Do not drink alcohol.  · Do not drink beverages with caffeine such as coffee, soda, and some teas.  · Maintain a healthy weight.  · Do not use diet pills unless your doctor says they are safe. They may make heart problems worse.  · Follow diet instructions as told by your doctor.  · Exercise regularly as told by your doctor.  · Keep all follow-up appointments.  GET HELP IF:  · You notice a change in the speed, rhythm, or strength of your heartbeat.  · You suddenly begin peeing (urinating) more often.  · You get tired more easily when moving or exercising.  GET HELP RIGHT AWAY IF:   · You have chest or belly (abdominal) pain.  · You feel sick to your stomach (nauseous).  · You are short of breath.  · You suddenly have swollen feet and ankles.  · You feel dizzy.  · You face, arms, or legs feel numb or weak.  · There is a change in your vision or speech.  MAKE SURE YOU:   · Understand these instructions.  · Will watch your condition.  · Will get help right away if you are not doing well or get worse.  Document Released: 03/17/2008 Document Revised: 10/23/2013 Document Reviewed: 07/19/2012  ExitCare® Patient Information ©2015 [employer]. This information is not  intended to replace advice given to you by your health care provider. Make sure you discuss any questions you have with your health care provider.

## 2014-08-10 NOTE — Progress Notes (Signed)
[name] discharged home with mother per MD order.  Discharge instructions reviewed and discussed with the patient, all questions and concerns answered. Copy of instructions and scripts given to patient.    Medication List    STOP taking these medications        aspirin 81 MG EC tablet      TAKE these medications        albuterol-ipratropium 18-103 MCG/ACT inhaler  Commonly known as:  COMBIVENT  Inhale 2 puffs into the lungs every 6 (six) hours as needed for wheezing or shortness of breath.     beclomethasone 80 MCG/ACT inhaler  Commonly known as:  QVAR  Inhale 1 puff into the lungs as needed (shortness of breath).     diltiazem 240 MG 24 hr capsule  Commonly known as:  CARDIZEM CD  Take 1 capsule (240 mg total) by mouth daily.     furosemide 20 MG tablet  Commonly known as:  LASIX  Take 1 tablet (20 mg total) by mouth daily.     hydrALAZINE 25 MG tablet  Commonly known as:  APRESOLINE  Take 1 tablet (25 mg total) by mouth 2 (two) times daily.     lisinopril 40 MG tablet  Commonly known as:  PRINIVIL,ZESTRIL  Take 1 tablet (40 mg total) by mouth daily.     metoprolol succinate 50 MG 24 hr tablet  Commonly known as:  TOPROL-XL  Take 50 mg by mouth at bedtime.     omeprazole 20 MG capsule  Commonly known as:  PRILOSEC  Take 1 capsule (20 mg total) by mouth daily.     potassium chloride SA 20 MEQ tablet  Commonly known as:  K-DUR,KLOR-CON  Take 1 tablet (20 mEq total) by mouth daily.        Patients skin is clean, dry and intact, no evidence of skin break down. IV site discontinued and catheter remains intact. Site without signs and symptoms of complications. Dressing and pressure applied.  Patient escorted to car by [place], NT,  no distress noted upon discharge.  Ubaldo Glassing 08/10/2014 1:27 PM

## 2014-08-10 NOTE — Discharge Summary (Signed)
Physician Discharge Summary  [name] ZOX:[id number] DOB: March 12, 1966 DOA: 08/08/2014  PCP: Fredirick Maudlin, MD  Admit date: 08/08/2014 Discharge date: 08/10/2014  Recommendations for Outpatient Follow-up:  Check CBC and BMP during next visit to PCP New BP med is hydralazine 25 mg BID per cardio recommendations, pt aware  Discharge Diagnoses:  Principal Problem:   Shortness of breath Active Problems:   Asthma   CHF (congestive heart failure)   A-fib   Hypertensive urgency   Obesity hypoventilation syndrome   GERD (gastroesophageal reflux disease)    Discharge Condition: stable   Diet recommendation: as tolerated   History of present illness:  49 y.o. male with history of atrial fibrillation but not on anticoagulation, history of chronic systolic and diastolic heart failure, last 2-D echo in 10/ 2015 with ejection fraction of 55%, obesity hypoventilation syndrome, hypertension who presented to [facility] with worsening shortness of breath and progressive weakness and fatigue over past 7 days prior to this admission. Patient also reported intermittent productive cough with wheezing. His symptoms were worse at night. He reports no significant leg swelling at least not more than his baseline. No reports of weight gain. On admission, blood pressure was 195/128 but subsequently improved to 134/81, heart rate 51 - 93, RR 16, T max 98.7 F and oxygen saturation 97% with nasal cannula oxygen support. Blood work revealed potassium of 3.4, troponin level was within normal limits. BMP was 244. The 12-lead EKG showed atrial fibrillation. TRH asked to admit for further evaluation and management of shortness of breath and weakness.   Assessment/Plan:    Principal Problem: Shortness of breath / acute on chronic combined systolic and diastolic CHF - Last 2-D echo in October 2015 with ejection fraction of 55%. BNP on this admission is only mildly elevated at 244 compared with 03/2014 value of  746. - Chest x-ray showed mild vascular congestion without focal infiltrates or edema. - Pt has been seen by cardio in consultation.  - Started low-dose Lasix 20 mg IV twice a day with good diuresis. Will resume lasix per prior home regimen on discharge.   Active Problems: Atrial fibrillation - CHADSVASC score is 2 (CHF, HTN) - Per cardiology pt is not a suitable candidate for anticoagulation given his long and well documented history of medication compliance and thus potential for adverse bleeding events - Rate controlled with Cardizem 240 mg daily and metoprolol 50 mg at bedtime  GERD - Continue Protonix   Hypokalemia - Secondary to Lasix. Repleted.  History of asthma - Continue Flovent inhaler twice daily  Essential hypertension / accelerated hypertension - On admission, blood pressure was 195/128. It subsequently improved to 134/81. - Will continue his home meds on discharge with addition of hydralazine 25 mg BID per cardio recommendations.     DVT Prophylaxis  - Heparin subcutaneous ordered while pt is in hospital    Code Status: Full.  Family Communication: plan of care discussed with the patient   IV access:  Peripheral IV  Procedures and diagnostic studies:   Dg Chest Portable 1 View 08/08/2014 Mild vascular congestion without focal infiltrate or edema.   Medical Consultants:  Cardiology  Other Consultants:  None  IAnti-Infectives:   None  Signed:  Manson Passey, MD  [facility] 08/10/2014, 11:48 AM  Pager #: [phone]   Discharge Exam: Filed Vitals:   08/10/14 0904  BP: 141/98  Pulse:   Temp:   Resp:    Filed Vitals:   08/09/14 2128 08/10/14 0515 08/10/14  0729 08/10/14 0904  BP: 139/98 151/113  141/98  Pulse: 77 70    Temp: 97.6 F (36.4 C) 98 F (36.7 C)    TempSrc: Oral Oral    Resp: 20 17    Height:      Weight:  153.4 kg (338 lb 3 oz)    SpO2: 99% 99% 96%     General: Pt is alert, follows  commands appropriately, not in acute distress Cardiovascular: irregular rhythm, rate controlled, S1/S2 +, no murmurs Respiratory: Clear to auscultation bilaterally, no wheezing, no crackles, no rhonchi Abdominal: Soft, non tender, non distended, bowel sounds +, no guarding Extremities: no edema, no cyanosis, pulses palpable bilaterally DP and PT Neuro: Grossly nonfocal  Discharge Instructions  Discharge Instructions    Call MD for:  difficulty breathing, headache or visual disturbances    Complete by:  As directed      Call MD for:  persistant nausea and vomiting    Complete by:  As directed      Call MD for:  severe uncontrolled pain    Complete by:  As directed      Diet - low sodium heart healthy    Complete by:  As directed      Increase activity slowly    Complete by:  As directed             Medication List    STOP taking these medications        aspirin 81 MG EC tablet      TAKE these medications        albuterol-ipratropium 18-103 MCG/ACT inhaler  Commonly known as:  COMBIVENT  Inhale 2 puffs into the lungs every 6 (six) hours as needed for wheezing or shortness of breath.     beclomethasone 80 MCG/ACT inhaler  Commonly known as:  QVAR  Inhale 1 puff into the lungs as needed (shortness of breath).     diltiazem 240 MG 24 hr capsule  Commonly known as:  CARDIZEM CD  Take 1 capsule (240 mg total) by mouth daily.     furosemide 20 MG tablet  Commonly known as:  LASIX  Take 1 tablet (20 mg total) by mouth daily.     hydrALAZINE 25 MG tablet  Commonly known as:  APRESOLINE  Take 1 tablet (25 mg total) by mouth 2 (two) times daily.     lisinopril 40 MG tablet  Commonly known as:  PRINIVIL,ZESTRIL  Take 1 tablet (40 mg total) by mouth daily.     metoprolol succinate 50 MG 24 hr tablet  Commonly known as:  TOPROL-XL  Take 50 mg by mouth at bedtime.     omeprazole 20 MG capsule  Commonly known as:  PRILOSEC  Take 1 capsule (20 mg total) by mouth daily.      potassium chloride SA 20 MEQ tablet  Commonly known as:  K-DUR,KLOR-CON  Take 1 tablet (20 mEq total) by mouth daily.            Follow-up Information    Follow up with HAWKINS,EDWARD L, MD. Schedule an appointment as soon as possible for a visit in 1 week.   Specialty:  Pulmonary Disease   Why:  Follow up appt after recent hospitalization   Contact information:   [address] [place] [address] [phone]        The results of significant diagnostics from this hospitalization (including imaging, microbiology, ancillary and laboratory) are listed below for reference.  Significant Diagnostic Studies: Dg Chest Portable 1 View  08/08/2014   CLINICAL DATA:  Productive cough for 3 months  EXAM: PORTABLE CHEST - 1 VIEW  COMPARISON:  06/28/2014  FINDINGS: Cardiac shadow is enlarged. Mild vascular congestion is noted but improved from the prior study. No parenchymal edema is noted. No infiltrate is seen.  IMPRESSION: Mild vascular congestion without focal infiltrate or edema.   Electronically Signed   By: Alcide Clever M.D.   On: 08/08/2014 12:51    Microbiology: No results found for this or any previous visit (from the past 240 hour(s)).   Labs: Basic Metabolic Panel:  Recent Labs Lab 08/08/14 1137 08/09/14 0212  NA 140 140  K 3.5 3.4*  CL 109 110  CO2 28 26  GLUCOSE 97 83  BUN 16 14  CREATININE 1.06 0.96  CALCIUM 8.8 8.6   Liver Function Tests: No results for input(s): AST, ALT, ALKPHOS, BILITOT, PROT, ALBUMIN in the last 168 hours. No results for input(s): LIPASE, AMYLASE in the last 168 hours. No results for input(s): AMMONIA in the last 168 hours. CBC:  Recent Labs Lab 08/08/14 1137  WBC 4.4  HGB 13.3  HCT 40.8  MCV 86.6  PLT 253   Cardiac Enzymes:  Recent Labs Lab 08/08/14 1137 08/08/14 1951 08/09/14 0212 08/09/14 0834  TROPONINI 0.03 <0.03 0.03 <0.03   BNP: BNP (last 3 results)  Recent Labs  06/21/14 1811  06/28/14 0309 08/08/14 1137  BNP 306.0* 396.0* 244.0*    ProBNP (last 3 results)  Recent Labs  04/02/14 1031  PROBNP 746.5*    CBG: No results for input(s): GLUCAP in the last 168 hours.  Time coordinating discharge: Over 30 minutes

## 2014-08-10 NOTE — Progress Notes (Addendum)
SUBJECTIVE: Pt has no complaints and is eager to go home. Still hypertensive.    Intake/Output Summary (Last 24 hours) at 08/10/14 1139 Last data filed at 08/10/14 0955  Gross per 24 hour  Intake    360 ml  Output    775 ml  Net   -415 ml    Current Facility-Administered Medications  Medication Dose Route Frequency Provider Last Rate Last Dose  . 0.9 %  sodium chloride infusion   Intravenous Continuous Vanetta Mulders, MD      . acetaminophen (TYLENOL) tablet 650 mg  650 mg Oral Q6H PRN Ozella Rocks, MD       Or  . acetaminophen (TYLENOL) suppository 650 mg  650 mg Rectal Q6H PRN Ozella Rocks, MD      . diltiazem (CARDIZEM CD) 24 hr capsule 240 mg  240 mg Oral Daily Ozella Rocks, MD   240 mg at 08/10/14 0904  . fluticasone (FLOVENT HFA) 44 MCG/ACT inhaler 2 puff  2 puff Inhalation BID Ozella Rocks, MD   2 puff at 08/10/14 1610  . furosemide (LASIX) injection 20 mg  20 mg Intravenous BID Ozella Rocks, MD   20 mg at 08/10/14 9604  . heparin injection 5,000 Units  5,000 Units Subcutaneous 3 times per day Ozella Rocks, MD   5,000 Units at 08/10/14 0511  . hydrALAZINE (APRESOLINE) injection 5 mg  5 mg Intravenous Q4H PRN Ozella Rocks, MD   5 mg at 08/10/14 0518  . lisinopril (PRINIVIL,ZESTRIL) tablet 40 mg  40 mg Oral Daily Ozella Rocks, MD   40 mg at 08/10/14 0904  . metoprolol succinate (TOPROL-XL) 24 hr tablet 50 mg  50 mg Oral QHS Ozella Rocks, MD   50 mg at 08/09/14 2047  . ondansetron (ZOFRAN) tablet 4 mg  4 mg Oral Q6H PRN Ozella Rocks, MD       Or  . ondansetron [facility]) injection 4 mg  4 mg Intravenous Q6H PRN Ozella Rocks, MD      . oxyCODONE (Oxy IR/ROXICODONE) immediate release tablet 5 mg  5 mg Oral Q4H PRN Ozella Rocks, MD      . pantoprazole (PROTONIX) EC tablet 40 mg  40 mg Oral Daily Ozella Rocks, MD   40 mg at 08/10/14 0904  . potassium chloride SA (K-DUR,KLOR-CON) CR tablet 20 mEq  20 mEq Oral BID Fredirick Maudlin, MD   20  mEq at 08/10/14 0904    Filed Vitals:   08/09/14 2128 08/10/14 0515 08/10/14 0729 08/10/14 0904  BP: 139/98 151/113  141/98  Pulse: 77 70    Temp: 97.6 F (36.4 C) 98 F (36.7 C)    TempSrc: Oral Oral    Resp: 20 17    Height:      Weight:  338 lb 3 oz (153.4 kg)    SpO2: 99% 99% 96%     PHYSICAL EXAM General: NAD, morbidly obese. HEENT: Wearing sunglasses. Neck: No JVD, no thyromegaly. Lungs: Diminished sounds but no wheezes/rales. CV: Nondisplaced PMI. Irregular rhythm, normal S1/S2, no S3, no murmur. Trace bilateral pretibial and periankle edema.  Abdomen: Morbidly obese.  Neurologic: Alert and oriented.  Psych: Normal affect. Skin: Normal. Musculoskeletal: No gross deformities. Extremities: No clubbing or cyanosis.   TELEMETRY: Reviewed telemetry pt in atrial fibrillation, rate-controlled.  LABS: Basic Metabolic Panel:  Recent Labs  54/09/81 1137 08/09/14 0212  NA 140 140  K  3.5 3.4*  CL 109 110  CO2 28 26  GLUCOSE 97 83  BUN 16 14  CREATININE 1.06 0.96  CALCIUM 8.8 8.6   Liver Function Tests: No results for input(s): AST, ALT, ALKPHOS, BILITOT, PROT, ALBUMIN in the last 72 hours. No results for input(s): LIPASE, AMYLASE in the last 72 hours. CBC:  Recent Labs  08/08/14 1137  WBC 4.4  HGB 13.3  HCT 40.8  MCV 86.6  PLT 253   Cardiac Enzymes:  Recent Labs  08/08/14 1951 08/09/14 0212 08/09/14 0834  TROPONINI <0.03 0.03 <0.03   BNP: Invalid input(s): POCBNP D-Dimer: No results for input(s): DDIMER in the last 72 hours. Hemoglobin A1C: No results for input(s): HGBA1C in the last 72 hours. Fasting Lipid Panel: No results for input(s): CHOL, HDL, LDLCALC, TRIG, CHOLHDL, LDLDIRECT in the last 72 hours. Thyroid Function Tests: No results for input(s): TSH, T4TOTAL, T3FREE, THYROIDAB in the last 72 hours.  Invalid input(s): FREET3 Anemia Panel: No results for input(s): VITAMINB12, FOLATE, FERRITIN, TIBC, IRON, RETICCTPCT in the last  72 hours.  RADIOLOGY: Dg Chest Portable 1 View  08/08/2014   CLINICAL DATA:  Productive cough for 3 months  EXAM: PORTABLE CHEST - 1 VIEW  COMPARISON:  06/28/2014  FINDINGS: Cardiac shadow is enlarged. Mild vascular congestion is noted but improved from the prior study. No parenchymal edema is noted. No infiltrate is seen.  IMPRESSION: Mild vascular congestion without focal infiltrate or edema.   Electronically Signed   By: Alcide Clever M.D.   On: 08/08/2014 12:51      ASSESSMENT AND PLAN: 1. Hypertensive urgency: BP had normalized yesterday, but elevated today with more pronounced diastolic elevation. I discussed adding another medication to optimize BP control and he is agreeable. I will start hydralazine at a reduced frequency of 25 mg bid to encourage compliance. I again emphasized the importance of treatment compliance. 2. Atrial fibrillation: Rate controlled on long-acting diltiazem and metoprolol. While CHADSVASC score is 2 (CHF, HTN), he is not a suitable candidate for anticoagulation given his long and well documented history of medication compliance and thus potential for adverse bleeding events. 3. Chronic diastolic heart failure: BNP minimally elevated. Has had 2.4 L output. I will transition back to oral Lasix 20 mg daily. 4. Nonsustained VT: No chest pain. Normal LV systolic function. If he were to have recurrences, I would consider stress testing.  Dispo: No further recommendations. Suitable for discharge from my standpoint. I have already arranged for outpatient follow up with me.   Prentice Docker, M.D., F.A.C.C.

## 2014-08-28 ENCOUNTER — Encounter: Payer: Self-pay | Admitting: Cardiovascular Disease

## 2014-08-28 ENCOUNTER — Ambulatory Visit (INDEPENDENT_AMBULATORY_CARE_PROVIDER_SITE_OTHER): Payer: Medicare Other | Admitting: Cardiovascular Disease

## 2014-08-28 VITALS — BP 166/79 | HR 96 | Ht 72.0 in | Wt 311.0 lb

## 2014-08-28 DIAGNOSIS — I4729 Other ventricular tachycardia: Secondary | ICD-10-CM

## 2014-08-28 DIAGNOSIS — Z7901 Long term (current) use of anticoagulants: Secondary | ICD-10-CM

## 2014-08-28 DIAGNOSIS — I10 Essential (primary) hypertension: Secondary | ICD-10-CM

## 2014-08-28 DIAGNOSIS — Z5181 Encounter for therapeutic drug level monitoring: Secondary | ICD-10-CM

## 2014-08-28 DIAGNOSIS — I5032 Chronic diastolic (congestive) heart failure: Secondary | ICD-10-CM

## 2014-08-28 DIAGNOSIS — I472 Ventricular tachycardia: Secondary | ICD-10-CM

## 2014-08-28 DIAGNOSIS — G4730 Sleep apnea, unspecified: Secondary | ICD-10-CM

## 2014-08-28 DIAGNOSIS — I4891 Unspecified atrial fibrillation: Secondary | ICD-10-CM

## 2014-08-28 MED ORDER — RIVAROXABAN 20 MG PO TABS: 20.0000 mg | ORAL_TABLET | Freq: Every day | ORAL | Status: DC

## 2014-08-28 MED ORDER — DILTIAZEM HCL ER COATED BEADS 240 MG PO CP24: ORAL_CAPSULE | ORAL | Status: DC

## 2014-08-28 MED ORDER — FUROSEMIDE 20 MG PO TABS: 20.0000 mg | ORAL_TABLET | Freq: Every day | ORAL | Status: DC

## 2014-08-28 MED ORDER — HYDRALAZINE HCL 50 MG PO TABS: 50.0000 mg | ORAL_TABLET | Freq: Two times a day (BID) | ORAL | Status: DC

## 2014-08-28 NOTE — Progress Notes (Signed)
Patient ID: [name], male   DOB: [birth year], 49 y.o.   MRN: [id number]      SUBJECTIVE: The patient returns for follow-up after recently being hospitalized for hypertensive urgency with acute on chronic diastolic heart failure. He also demonstrated nonsustained ventricular tachycardia without chest pain. He also has atrial fibrillation and is not on anticoagulation due to poor compliance. He was started on hydralazine. He said he is taking his medications regularly. He denies chest pain, palpitations, and shortness of breath. He said he is allergic to bananas and wonders if this has anything to do with his cardiac issues.  He said his leg swelling is about the same. He ran out of Lasix one week ago.  Review of Systems: As per "subjective", otherwise negative.  Allergies  Allergen Reactions  . Bee Venom Shortness Of Breath and Swelling  . Aspirin Other (See Comments)    Chewable 81 mg tablets upsets patients stomach    Current Outpatient Prescriptions  Medication Sig Dispense Refill  . albuterol-ipratropium (COMBIVENT) 18-103 MCG/ACT inhaler Inhale 2 puffs into the lungs every 6 (six) hours as needed for wheezing or shortness of breath. 1 Inhaler o  . beclomethasone (QVAR) 80 MCG/ACT inhaler Inhale 1 puff into the lungs as needed (shortness of breath). (Patient taking differently: Inhale 2 puffs into the lungs every morning. ) 1 Inhaler 12  . diltiazem (CARDIZEM CD) 240 MG 24 hr capsule Take 1 capsule (240 mg total) by mouth daily. 30 capsule 1  . furosemide (LASIX) 20 MG tablet Take 1 tablet (20 mg total) by mouth daily. 30 tablet 0  . hydrALAZINE (APRESOLINE) 25 MG tablet Take 1 tablet (25 mg total) by mouth 2 (two) times daily. 60 tablet 0  . lisinopril (PRINIVIL,ZESTRIL) 40 MG tablet Take 1 tablet (40 mg total) by mouth daily. 30 tablet 0  . metoprolol succinate (TOPROL-XL) 50 MG 24 hr tablet Take 50 mg by mouth at bedtime.  6  . omeprazole (PRILOSEC) 20 MG capsule Take 1 capsule  (20 mg total) by mouth daily. 30 capsule 11  . potassium chloride SA (K-DUR,KLOR-CON) 20 MEQ tablet Take 1 tablet (20 mEq total) by mouth daily. (Patient taking differently: Take 20 mEq by mouth at bedtime. ) 30 tablet 0   No current facility-administered medications for this visit.    Past Medical History  Diagnosis Date  . Hypertensive heart disease     Hypertensive heart disease with prior episodes of CHF  . Hyperlipidemia   . Hypertension   . Gastroesophageal reflux disease   . Osteoarthritis   . Peptic ulcer disease   . Morbid obesity     /sleep apnea  . Allergic rhinitis     plus h/o asthma  . Tobacco abuse, in remission     Cigarettes discontinued in 2010  . CHF (congestive heart failure)   . A-fib     2015    Past Surgical History  Procedure Laterality Date  . None      History   Social History  . Marital Status: Divorced    Spouse Name: N/A  . Number of Children: 3  . Years of Education: N/A   Occupational History  . disabled    Social History Main Topics  . Smoking status: Former Smoker -- 0.50 packs/day for 20 years    Types: Cigarettes    Start date: 04/26/1988    Quit date: 06/23/2007  . Smokeless tobacco: Never Used     Comment: 1 ppd former smoker  .  Alcohol Use: No     Comment: no etoh since 2009  . Drug Use: No  . Sexual Activity: No   Other Topics Concern  . Not on file   Social History Narrative     Filed Vitals:   08/28/14 0952  BP: 166/79  Pulse: 96  Height: 6' (1.829 m)  Weight: 311 lb (141.069 kg)    PHYSICAL EXAM General: NAD, morbidly obese. HEENT: Wearing sunglasses. Neck: No JVD, no thyromegaly. Lungs: Diminished sounds but no wheezes/rales. CV: Nondisplaced PMI. Irregular rhythm, normal S1/S2, no S3, no murmur. Trace bilateral pretibial and periankle edema. Chronic venous stasis bilaterally.  Abdomen: Morbidly obese.  Neurologic: Alert and oriented.  Psych: Normal affect. Skin: Normal. Musculoskeletal: No  gross deformities. Extremities: No clubbing or cyanosis.   ECG: Most recent ECG reviewed.      ASSESSMENT AND PLAN: 1. Malignant hypertension: Remains elevated. I recently started hydralazine at a reduced frequency of 25 mg bid to encourage compliance. I will increase it to 50 mg bid. 2. Atrial fibrillation: HR at upper normal limits. Will increase long-acting diltiazem to 360 mg daily and continue Toprol-XL 50 mg every morning. CHADSVASC score is 2 (CHF, HTN), thus I will initiate Xarelto 20 mg daily and have him follow up with our anticoagulation nurse in a month and with me in two months. I have emphasized the importance of medication compliance. 3. Chronic diastolic heart failure: Needs more optimal BP control. Will increase hydralazine to 50 mg bid. Will refill Lasix. 4. Nonsustained VT: No chest pain. Normal LV systolic function. If he were to have recurrences, I would consider stress testing. 5. Screening due to morbid obesity: Will check HbA1C.  Dispo: f/u 2 months.  Prentice Docker, M.D., F.A.C.C.

## 2014-08-28 NOTE — Patient Instructions (Addendum)
   Increase Hydralazine to 50mg  twice a day  - new sent to pharmacy today.   Increase Diltiazem to 360 mg in the morning  Continue all other medications.   Follow up in  6 weeks  Labs HgA1C Start Xarelto 20 mg daily we have given you samples

## 2014-08-28 NOTE — Addendum Note (Signed)
Addended by: Burman Nieves T on: 08/28/2014 10:25 AM   Modules accepted: Orders

## 2014-09-08 ENCOUNTER — Other Ambulatory Visit: Payer: Self-pay | Admitting: Adult Health

## 2014-09-10 ENCOUNTER — Other Ambulatory Visit: Payer: Self-pay | Admitting: *Deleted

## 2014-09-10 MED ORDER — METOPROLOL SUCCINATE ER 50 MG PO TB24: 50.0000 mg | ORAL_TABLET | Freq: Every day | ORAL | Status: DC

## 2014-09-18 ENCOUNTER — Telehealth: Payer: Self-pay | Admitting: *Deleted

## 2014-09-18 ENCOUNTER — Other Ambulatory Visit: Payer: Self-pay | Admitting: *Deleted

## 2014-09-18 MED ORDER — FUROSEMIDE 20 MG PO TABS: 20.0000 mg | ORAL_TABLET | Freq: Every day | ORAL | Status: DC

## 2014-09-18 NOTE — Telephone Encounter (Signed)
Patient states that he stopped his Xarelto due to stomach pain.  No nausea, vomiting, or diarrhea.  Stated he only took med x 1 week.  States his stomach is feeling better since stopping.  Informed patient that message will be sent to provider as he may want him to try different anticoagulant.

## 2014-09-18 NOTE — Telephone Encounter (Signed)
Lasix refilled to [facility] for 90 day supply at patient request.

## 2014-09-18 NOTE — Telephone Encounter (Signed)
Can try Eliquis 5 mg bid. Would give 1 week of samples first to see if he tolerates.

## 2014-09-20 MED ORDER — APIXABAN 5 MG PO TABS: 5.0000 mg | ORAL_TABLET | Freq: Two times a day (BID) | ORAL | Status: DC

## 2014-09-20 NOTE — Telephone Encounter (Signed)
Patient notified.  He is in agreement to try new medication.  He will try to pick up today.  Will give 3 weeks of samples.  He will notify me for prescription if he is able to tolerated this medication.

## 2014-10-15 ENCOUNTER — Ambulatory Visit: Payer: Medicare Other | Admitting: Cardiovascular Disease

## 2014-11-06 ENCOUNTER — Telehealth: Payer: Self-pay | Admitting: *Deleted

## 2014-11-06 ENCOUNTER — Encounter: Payer: Self-pay | Admitting: *Deleted

## 2014-11-06 NOTE — Telephone Encounter (Signed)
Numerous calls placed to patient without success regarding recent missed appointment and lab work.  Letter mailed to patient.

## 2014-11-09 ENCOUNTER — Ambulatory Visit: Payer: Medicare Other | Admitting: Cardiovascular Disease

## 2014-11-28 ENCOUNTER — Ambulatory Visit: Payer: Medicare Other | Admitting: Cardiovascular Disease

## 2014-11-28 ENCOUNTER — Encounter: Payer: Self-pay | Admitting: Cardiovascular Disease

## 2014-12-07 ENCOUNTER — Other Ambulatory Visit: Payer: Self-pay | Admitting: Cardiovascular Disease

## 2014-12-08 LAB — HEMOGLOBIN A1C
Hgb A1c MFr Bld: 5.8 % — ABNORMAL HIGH (ref <5.7)
MEAN PLASMA GLUCOSE: 120 mg/dL — AB (ref <117)

## 2014-12-11 ENCOUNTER — Telehealth: Payer: Self-pay | Admitting: *Deleted

## 2014-12-11 NOTE — Telephone Encounter (Signed)
-----   Message from Lesle Chris, LPN sent at 10/16/8339  5:27 PM EDT -----   ----- Message -----    From: [name], MD    Sent: 12/10/2014  10:58 AM      To: Albertine Patricia, CMA  Ok.

## 2014-12-11 NOTE — Telephone Encounter (Signed)
Notes Recorded by Lesle Chris, LPN on 2/95/6213 at 9:44 AM Patient notified. Reminded to keep appointment as scheduled for 12/17/2014 with Dr. Purvis Sheffield.

## 2014-12-17 ENCOUNTER — Ambulatory Visit (INDEPENDENT_AMBULATORY_CARE_PROVIDER_SITE_OTHER): Payer: Medicare Other | Admitting: Cardiovascular Disease

## 2014-12-17 ENCOUNTER — Encounter: Payer: Self-pay | Admitting: Cardiovascular Disease

## 2014-12-17 VITALS — BP 159/121 | HR 107 | Ht 72.0 in | Wt 314.8 lb

## 2014-12-17 DIAGNOSIS — Z5181 Encounter for therapeutic drug level monitoring: Secondary | ICD-10-CM

## 2014-12-17 DIAGNOSIS — I472 Ventricular tachycardia: Secondary | ICD-10-CM

## 2014-12-17 DIAGNOSIS — Z7901 Long term (current) use of anticoagulants: Secondary | ICD-10-CM

## 2014-12-17 DIAGNOSIS — I10 Essential (primary) hypertension: Secondary | ICD-10-CM

## 2014-12-17 DIAGNOSIS — I4729 Other ventricular tachycardia: Secondary | ICD-10-CM

## 2014-12-17 DIAGNOSIS — I4891 Unspecified atrial fibrillation: Secondary | ICD-10-CM | POA: Diagnosis not present

## 2014-12-17 DIAGNOSIS — I5032 Chronic diastolic (congestive) heart failure: Secondary | ICD-10-CM

## 2014-12-17 DIAGNOSIS — Z9114 Patient's other noncompliance with medication regimen: Secondary | ICD-10-CM

## 2014-12-17 MED ORDER — HYDRALAZINE HCL 100 MG PO TABS: 100.0000 mg | ORAL_TABLET | Freq: Two times a day (BID) | ORAL | Status: DC

## 2014-12-17 NOTE — Progress Notes (Signed)
Patient ID: [name], male   DOB: 1965/11/08, 49 y.o.   MRN: [id number]      SUBJECTIVE: The patient presents for follow-up of malignant hypertension, chronic diastolic heart failure, nonsustained ventricular tachycardia, and atrial fibrillation. I previously prescribed Eliquis but he decided not to take it because he was scared to do so. He sometimes takes hydralazine twice a day but often takes it once a day. He sometimes takes his metoprolol but did not take it today. He denies chest pain and shortness of breath. He wears sunglasses because bright lights bother him.   Review of Systems: As per "subjective", otherwise negative.  Allergies  Allergen Reactions  . Bee Venom Shortness Of Breath and Swelling  . Aspirin Other (See Comments)    Chewable 81 mg tablets upsets patients stomach    Current Outpatient Prescriptions  Medication Sig Dispense Refill  . beclomethasone (QVAR) 80 MCG/ACT inhaler Inhale 1 puff into the lungs as needed (shortness of breath). (Patient taking differently: Inhale 2 puffs into the lungs 2 (two) times daily. ) 1 Inhaler 12  . diltiazem (CARDIZEM CD) 240 MG 24 hr capsule Take 1 and 1/2 tab in the morning 90 capsule 3  . furosemide (LASIX) 20 MG tablet Take 1 tablet (20 mg total) by mouth daily. (Patient taking differently: Take 40 mg by mouth daily. ) 90 tablet 3  . hydrALAZINE (APRESOLINE) 50 MG tablet Take 1 tablet (50 mg total) by mouth 2 (two) times daily. 180 tablet 3  . metoprolol succinate (TOPROL-XL) 50 MG 24 hr tablet Take 1 tablet (50 mg total) by mouth at bedtime. 30 tablet 6  . omeprazole (PRILOSEC) 20 MG capsule Take 1 capsule (20 mg total) by mouth daily. 30 capsule 11  . apixaban (ELIQUIS) 5 MG TABS tablet Take 1 tablet (5 mg total) by mouth 2 (two) times daily. (Patient not taking: Reported on 12/17/2014) 42 tablet 0   No current facility-administered medications for this visit.    Past Medical History  Diagnosis Date  . Hypertensive heart  disease     Hypertensive heart disease with prior episodes of CHF  . Hyperlipidemia   . Hypertension   . Gastroesophageal reflux disease   . Osteoarthritis   . Peptic ulcer disease   . Morbid obesity     /sleep apnea  . Allergic rhinitis     plus h/o asthma  . Tobacco abuse, in remission     Cigarettes discontinued in 2010  . CHF (congestive heart failure)   . A-fib     2015    Past Surgical History  Procedure Laterality Date  . None      History   Social History  . Marital Status: Divorced    Spouse Name: N/A  . Number of Children: 3  . Years of Education: N/A   Occupational History  . disabled    Social History Main Topics  . Smoking status: Former Smoker -- 0.50 packs/day for 20 years    Types: Cigarettes    Start date: 04/26/1988    Quit date: 06/23/2007  . Smokeless tobacco: Never Used     Comment: 1 ppd former smoker  . Alcohol Use: No     Comment: no etoh since 2009  . Drug Use: No  . Sexual Activity: No   Other Topics Concern  . Not on file   Social History Narrative     Filed Vitals:   12/17/14 1614 12/17/14 1627  BP: 181/132 159/121  Pulse: 107  Height: 6' (1.829 m)   Weight: 314 lb 12.8 oz (142.792 kg)   SpO2: 97%     PHYSICAL EXAM General: NAD, morbidly obese. HEENT: Wearing sunglasses. Neck: JVP difficult to assess. Lungs: Diminished sounds but no wheezes/rales. CV: Nondisplaced PMI. Irregular rhythm, normal S1/S2, no S3, no murmur. No bilateral pretibial and periankle edema. Chronic venous stasis bilaterally.  Abdomen: Morbidly obese.  Neurologic: Alert and oriented.  Psych: Normal affect. Skin: Right ankle lipoma. Musculoskeletal: No gross deformities. Extremities: No clubbing or cyanosis.   ECG: Most recent ECG reviewed.      ASSESSMENT AND PLAN: 1. Malignant hypertension: Remains elevated and I doubt medication compliance. Will increase hydralazine to 100 mg bid. Educated about risk of MI, CVA, and death.  2.  Atrial fibrillation: HR mildly elevated but did not take metoprolol, and encouraged to do so. Continue long-acting diltiazem 360 mg daily. CHADSVASC score is 2 (CHF, HTN). He decided not to take Eliquis. I informed of risk for stroke.  3. Chronic diastolic heart failure: Needs more optimal BP control. Will increase hydralazine to 100 mg bid and encouraged medication compliance. Will refill Lasix.  4. Nonsustained VT: No chest pain. Normal LV systolic function. If he were to have recurrences, I would consider stress testing.  Dispo: f/u 3 months.  Prentice Docker, M.D., F.A.C.C.

## 2014-12-17 NOTE — Addendum Note (Signed)
Addended by: Lesle Chris on: 12/17/2014 04:55 PM   Modules accepted: Orders, Medications, Level of Service

## 2014-12-17 NOTE — Patient Instructions (Signed)
   Increase Hydralazine to 100mg  twice a day  - printed script provided today.    Continue your Lasix at 20mg  daily Continue all other medications.   Follow up in  3 months

## 2015-02-26 ENCOUNTER — Other Ambulatory Visit: Payer: Self-pay | Admitting: *Deleted

## 2015-02-26 ENCOUNTER — Other Ambulatory Visit: Payer: Self-pay

## 2015-02-26 MED ORDER — METOPROLOL SUCCINATE ER 50 MG PO TB24: 50.0000 mg | ORAL_TABLET | Freq: Every day | ORAL | Status: DC

## 2015-02-27 MED ORDER — OMEPRAZOLE 20 MG PO CPDR: 20.0000 mg | DELAYED_RELEASE_CAPSULE | Freq: Every day | ORAL | Status: DC

## 2015-03-19 ENCOUNTER — Ambulatory Visit: Payer: Medicare Other | Admitting: Cardiovascular Disease

## 2015-04-01 ENCOUNTER — Emergency Department (HOSPITAL_COMMUNITY): Payer: Medicare Other

## 2015-04-01 ENCOUNTER — Observation Stay (HOSPITAL_COMMUNITY)
Admission: EM | Admit: 2015-04-01 | Discharge: 2015-04-02 | Disposition: A | Discharge status: Home / Self Care | Payer: Medicare Other | Attending: Internal Medicine | Admitting: Internal Medicine

## 2015-04-01 ENCOUNTER — Encounter (HOSPITAL_COMMUNITY): Payer: Self-pay | Admitting: *Deleted

## 2015-04-01 DIAGNOSIS — I5032 Chronic diastolic (congestive) heart failure: Secondary | ICD-10-CM | POA: Diagnosis not present

## 2015-04-01 DIAGNOSIS — E876 Hypokalemia: Secondary | ICD-10-CM | POA: Diagnosis present

## 2015-04-01 DIAGNOSIS — K219 Gastro-esophageal reflux disease without esophagitis: Secondary | ICD-10-CM | POA: Insufficient documentation

## 2015-04-01 DIAGNOSIS — R778 Other specified abnormalities of plasma proteins: Secondary | ICD-10-CM | POA: Diagnosis present

## 2015-04-01 DIAGNOSIS — I482 Chronic atrial fibrillation: Secondary | ICD-10-CM | POA: Diagnosis not present

## 2015-04-01 DIAGNOSIS — Z79899 Other long term (current) drug therapy: Secondary | ICD-10-CM | POA: Insufficient documentation

## 2015-04-01 DIAGNOSIS — J159 Unspecified bacterial pneumonia: Secondary | ICD-10-CM | POA: Diagnosis not present

## 2015-04-01 DIAGNOSIS — E785 Hyperlipidemia, unspecified: Secondary | ICD-10-CM | POA: Diagnosis present

## 2015-04-01 DIAGNOSIS — J9601 Acute respiratory failure with hypoxia: Secondary | ICD-10-CM | POA: Diagnosis not present

## 2015-04-01 DIAGNOSIS — J45909 Unspecified asthma, uncomplicated: Secondary | ICD-10-CM | POA: Diagnosis not present

## 2015-04-01 DIAGNOSIS — Z87891 Personal history of nicotine dependence: Secondary | ICD-10-CM | POA: Insufficient documentation

## 2015-04-01 DIAGNOSIS — K279 Peptic ulcer, site unspecified, unspecified as acute or chronic, without hemorrhage or perforation: Secondary | ICD-10-CM | POA: Insufficient documentation

## 2015-04-01 DIAGNOSIS — I110 Hypertensive heart disease with heart failure: Secondary | ICD-10-CM | POA: Diagnosis not present

## 2015-04-01 DIAGNOSIS — I10 Essential (primary) hypertension: Secondary | ICD-10-CM | POA: Diagnosis present

## 2015-04-01 DIAGNOSIS — J189 Pneumonia, unspecified organism: Secondary | ICD-10-CM | POA: Diagnosis not present

## 2015-04-01 DIAGNOSIS — I509 Heart failure, unspecified: Secondary | ICD-10-CM | POA: Insufficient documentation

## 2015-04-01 DIAGNOSIS — I4891 Unspecified atrial fibrillation: Secondary | ICD-10-CM | POA: Diagnosis present

## 2015-04-01 DIAGNOSIS — M1990 Unspecified osteoarthritis, unspecified site: Secondary | ICD-10-CM | POA: Diagnosis not present

## 2015-04-01 DIAGNOSIS — R7989 Other specified abnormal findings of blood chemistry: Secondary | ICD-10-CM | POA: Diagnosis present

## 2015-04-01 DIAGNOSIS — G4730 Sleep apnea, unspecified: Secondary | ICD-10-CM | POA: Diagnosis present

## 2015-04-01 DIAGNOSIS — R0602 Shortness of breath: Secondary | ICD-10-CM | POA: Diagnosis present

## 2015-04-01 LAB — LACTIC ACID, PLASMA: Lactic Acid, Venous: 1.3 mmol/L (ref 0.5–2.0)

## 2015-04-01 LAB — CBC WITH DIFFERENTIAL/PLATELET
BASOS ABS: 0 10*3/uL (ref 0.0–0.1)
BASOS PCT: 0 %
EOS ABS: 0 10*3/uL (ref 0.0–0.7)
EOS PCT: 0 %
HCT: 39.5 % (ref 39.0–52.0)
HEMOGLOBIN: 13.4 g/dL (ref 13.0–17.0)
Lymphocytes Relative: 8 %
Lymphs Abs: 0.7 10*3/uL (ref 0.7–4.0)
MCH: 29.5 pg (ref 26.0–34.0)
MCHC: 33.9 g/dL (ref 30.0–36.0)
MCV: 87 fL (ref 78.0–100.0)
Monocytes Absolute: 0.5 10*3/uL (ref 0.1–1.0)
Monocytes Relative: 6 %
NEUTROS PCT: 86 %
Neutro Abs: 7.2 10*3/uL (ref 1.7–7.7)
PLATELETS: 178 10*3/uL (ref 150–400)
RBC: 4.54 MIL/uL (ref 4.22–5.81)
RDW: 13.3 % (ref 11.5–15.5)
WBC: 8.4 10*3/uL (ref 4.0–10.5)

## 2015-04-01 LAB — COMPREHENSIVE METABOLIC PANEL
ALBUMIN: 3.9 g/dL (ref 3.5–5.0)
ALK PHOS: 51 U/L (ref 38–126)
ALT: 24 U/L (ref 17–63)
AST: 29 U/L (ref 15–41)
Anion gap: 10 (ref 5–15)
BUN: 13 mg/dL (ref 6–20)
CALCIUM: 8 mg/dL — AB (ref 8.9–10.3)
CHLORIDE: 106 mmol/L (ref 101–111)
CO2: 26 mmol/L (ref 22–32)
CREATININE: 0.99 mg/dL (ref 0.61–1.24)
GFR calc non Af Amer: >60 mL/min (ref >60)
GLUCOSE: 99 mg/dL (ref 65–99)
Potassium: 3.3 mmol/L — ABNORMAL LOW (ref 3.5–5.1)
SODIUM: 142 mmol/L (ref 135–145)
Total Bilirubin: 2.1 mg/dL — ABNORMAL HIGH (ref 0.3–1.2)
Total Protein: 6.6 g/dL (ref 6.5–8.1)

## 2015-04-01 LAB — BRAIN NATRIURETIC PEPTIDE: B NATRIURETIC PEPTIDE 5: 370 pg/mL — AB (ref 0.0–100.0)

## 2015-04-01 LAB — TROPONIN I
TROPONIN I: 0.03 ng/mL (ref <0.031)
Troponin I: 0.04 ng/mL — ABNORMAL HIGH (ref <0.031)
Troponin I: 0.04 ng/mL — ABNORMAL HIGH (ref <0.031)

## 2015-04-01 MED ORDER — LEVALBUTEROL HCL 0.63 MG/3ML IN NEBU: 0.6300 mg | INHALATION_SOLUTION | RESPIRATORY_TRACT | Status: DC | PRN

## 2015-04-01 MED ORDER — DEXTROSE 5 % IV SOLN
500.0000 mg | INTRAVENOUS | Status: DC
  Administered 2015-04-02: 500 mg via INTRAVENOUS
  Filled 2015-04-01 (×2): qty 500

## 2015-04-01 MED ORDER — FUROSEMIDE 10 MG/ML IJ SOLN
40.0000 mg | Freq: Once | INTRAMUSCULAR | Status: AC
  Administered 2015-04-01: 40 mg via INTRAVENOUS
  Filled 2015-04-01: qty 4

## 2015-04-01 MED ORDER — ALUM & MAG HYDROXIDE-SIMETH 200-200-20 MG/5ML PO SUSP
30.0000 mL | Freq: Once | ORAL | Status: AC
  Administered 2015-04-01: 30 mL via ORAL
  Filled 2015-04-01: qty 30

## 2015-04-01 MED ORDER — PANTOPRAZOLE SODIUM 40 MG PO TBEC
40.0000 mg | DELAYED_RELEASE_TABLET | Freq: Every day | ORAL | Status: DC
  Administered 2015-04-01 – 2015-04-02 (×2): 40 mg via ORAL
  Filled 2015-04-01 (×2): qty 1

## 2015-04-01 MED ORDER — GUAIFENESIN ER 600 MG PO TB12
1200.0000 mg | ORAL_TABLET | Freq: Two times a day (BID) | ORAL | Status: DC
  Administered 2015-04-01 – 2015-04-02 (×3): 1200 mg via ORAL
  Filled 2015-04-01 (×3): qty 2

## 2015-04-01 MED ORDER — SODIUM CHLORIDE 0.9 % IJ SOLN
3.0000 mL | Freq: Two times a day (BID) | INTRAMUSCULAR | Status: DC
  Administered 2015-04-02: 3 mL via INTRAVENOUS

## 2015-04-01 MED ORDER — ONDANSETRON HCL 4 MG PO TABS
4.0000 mg | ORAL_TABLET | Freq: Four times a day (QID) | ORAL | Status: DC | PRN
  Administered 2015-04-01: 4 mg via ORAL
  Filled 2015-04-01: qty 1

## 2015-04-01 MED ORDER — LEVALBUTEROL HCL 0.63 MG/3ML IN NEBU
0.6300 mg | INHALATION_SOLUTION | Freq: Four times a day (QID) | RESPIRATORY_TRACT | Status: DC
Start: 2015-04-02 — End: 2015-04-02
  Administered 2015-04-02 (×2): 0.63 mg via RESPIRATORY_TRACT
  Filled 2015-04-01 (×2): qty 3

## 2015-04-01 MED ORDER — DEXTROSE 5 % IV SOLN
500.0000 mg | INTRAVENOUS | Status: DC
  Administered 2015-04-01: 500 mg via INTRAVENOUS
  Filled 2015-04-01 (×2): qty 500

## 2015-04-01 MED ORDER — BECLOMETHASONE DIPROPIONATE 80 MCG/ACT IN AERS
2.0000 | INHALATION_SPRAY | Freq: Two times a day (BID) | RESPIRATORY_TRACT | Status: DC
Start: 2015-04-01 — End: 2015-04-01

## 2015-04-01 MED ORDER — ENOXAPARIN SODIUM 80 MG/0.8ML ~~LOC~~ SOLN
80.0000 mg | SUBCUTANEOUS | Status: DC
  Administered 2015-04-01: 80 mg via SUBCUTANEOUS
  Filled 2015-04-01: qty 0.8

## 2015-04-01 MED ORDER — ONDANSETRON HCL 4 MG/2ML IJ SOLN: 4.0000 mg | Freq: Four times a day (QID) | INTRAMUSCULAR | Status: DC | PRN

## 2015-04-01 MED ORDER — METOPROLOL SUCCINATE ER 50 MG PO TB24
50.0000 mg | ORAL_TABLET | Freq: Every day | ORAL | Status: DC
  Administered 2015-04-01: 50 mg via ORAL
  Filled 2015-04-01: qty 1

## 2015-04-01 MED ORDER — DEXTROSE 5 % IV SOLN
1.0000 g | INTRAVENOUS | Status: DC
  Filled 2015-04-01 (×2): qty 10

## 2015-04-01 MED ORDER — FUROSEMIDE 40 MG PO TABS
40.0000 mg | ORAL_TABLET | Freq: Every day | ORAL | Status: DC
  Administered 2015-04-02: 40 mg via ORAL
  Filled 2015-04-01: qty 1

## 2015-04-01 MED ORDER — POTASSIUM CHLORIDE CRYS ER 20 MEQ PO TBCR
40.0000 meq | EXTENDED_RELEASE_TABLET | Freq: Once | ORAL | Status: AC
  Administered 2015-04-01: 20 meq via ORAL
  Filled 2015-04-01: qty 2

## 2015-04-01 MED ORDER — IPRATROPIUM BROMIDE 0.02 % IN SOLN
0.5000 mg | RESPIRATORY_TRACT | Status: DC
  Administered 2015-04-01 (×2): 0.5 mg via RESPIRATORY_TRACT
  Filled 2015-04-01 (×2): qty 2.5

## 2015-04-01 MED ORDER — DEXTROSE 5 % IV SOLN
1.0000 g | Freq: Once | INTRAVENOUS | Status: AC
  Administered 2015-04-01: 1 g via INTRAVENOUS
  Filled 2015-04-01: qty 10

## 2015-04-01 MED ORDER — IPRATROPIUM BROMIDE 0.02 % IN SOLN
0.5000 mg | Freq: Four times a day (QID) | RESPIRATORY_TRACT | Status: DC
  Administered 2015-04-02 (×2): 0.5 mg via RESPIRATORY_TRACT
  Filled 2015-04-01 (×2): qty 2.5

## 2015-04-01 MED ORDER — LEVALBUTEROL HCL 0.63 MG/3ML IN NEBU
0.6300 mg | INHALATION_SOLUTION | RESPIRATORY_TRACT | Status: DC
  Administered 2015-04-01 (×2): 0.63 mg via RESPIRATORY_TRACT
  Filled 2015-04-01 (×2): qty 3

## 2015-04-01 MED ORDER — ACETAMINOPHEN 650 MG RE SUPP: 650.0000 mg | Freq: Four times a day (QID) | RECTAL | Status: DC | PRN

## 2015-04-01 MED ORDER — IPRATROPIUM-ALBUTEROL 0.5-2.5 (3) MG/3ML IN SOLN
3.0000 mL | Freq: Once | RESPIRATORY_TRACT | Status: AC
  Administered 2015-04-01: 3 mL via RESPIRATORY_TRACT
  Filled 2015-04-01: qty 3

## 2015-04-01 MED ORDER — SODIUM CHLORIDE 0.9 % IJ SOLN
3.0000 mL | Freq: Two times a day (BID) | INTRAMUSCULAR | Status: DC
  Administered 2015-04-01 – 2015-04-02 (×3): 3 mL via INTRAVENOUS

## 2015-04-01 MED ORDER — BUDESONIDE 0.25 MG/2ML IN SUSP
0.2500 mg | Freq: Two times a day (BID) | RESPIRATORY_TRACT | Status: DC
  Administered 2015-04-01 – 2015-04-02 (×2): 0.25 mg via RESPIRATORY_TRACT
  Filled 2015-04-01 (×5): qty 2

## 2015-04-01 MED ORDER — ACETAMINOPHEN 325 MG PO TABS: 650.0000 mg | ORAL_TABLET | Freq: Four times a day (QID) | ORAL | Status: DC | PRN

## 2015-04-01 MED ORDER — DILTIAZEM HCL ER COATED BEADS 180 MG PO CP24
360.0000 mg | ORAL_CAPSULE | Freq: Every day | ORAL | Status: DC
  Administered 2015-04-02: 360 mg via ORAL
  Filled 2015-04-01: qty 2

## 2015-04-01 MED ORDER — SODIUM CHLORIDE 0.9 % IJ SOLN: 3.0000 mL | INTRAMUSCULAR | Status: DC | PRN

## 2015-04-01 MED ORDER — HYDRALAZINE HCL 25 MG PO TABS
50.0000 mg | ORAL_TABLET | Freq: Two times a day (BID) | ORAL | Status: DC
  Administered 2015-04-01: 50 mg via ORAL
  Filled 2015-04-01 (×2): qty 2

## 2015-04-01 MED ORDER — SODIUM CHLORIDE 0.9 % IV SOLN: 250.0000 mL | INTRAVENOUS | Status: DC | PRN

## 2015-04-01 NOTE — ED Notes (Signed)
Attempted to call report, nurse in patient's room and will call back.

## 2015-04-01 NOTE — H&P (Signed)
[facility] History and Physical  [name]:[id number] DOB: 02-07-1966 DOA: 04/01/2015  Referring physician: Lonia Skinner. Keenan Bachelor PA-C PCP: Fredirick Maudlin, MD   Chief Complaint: Shortness of breath   HPI: [name] is a 49 y.o. male with history of HTN, CHF, Afib with RVR, and HLD presented with complaints of shortness of breath and productive cough, with pinkish sputum that onset two weeks ago. He admits subjective fever, chills, increased wheezing, vomiting, fatigue, rapid heart rate and swelling in right leg. He has a prescription for lasix that he reports taking as needed with noticeable diuresis. Chest pain is located in central of chest that is not radiating and is improving without any known alleviating factors. He does report worsening of his pain with coughing. He has progressive generalized weakness over the past two weeks. His symptoms have progressively worsening to the point he has difficulty walking across the room. Reports he stop smoking 10 years ago.  While in the ED, CXR revealed airspace consolidation in portions the right middle and lower lobes, most likely pneumonia. Started on Azithromycin  and Ceftriaxone. Admitted for further evaluation and management.   Review of Systems:  Constitutional:  No weight loss, night sweats,  Positive: Fevers, chills, fatigue.  HEENT:  No headaches, Difficulty swallowing,Tooth/dental problems,Sore throat,  No sneezing, itching, ear ache, nasal congestion, post nasal drip,  Cardio-vascular:  No , Orthopnea, PND, anasarca, dizziness, Positive: palpitations, chest pain, swelling in lower extremities, GI:  No heartburn, indigestion, abdominal pain, nausea, vomiting, diarrhea, change in bowel habits, loss of appetite  Resp:  No . No excess mucus, No non-productive cough, No coughing up of blood.No change in color of mucus. No chest wall deformity  Positive: Productive cough, wheezing. Shortness of breath with exertion or at  rest. Skin:  no rash or lesions.  GU:  no dysuria, change in color of urine, no urgency or frequency. No flank pain.  Musculoskeletal:  No joint pain or swelling. No decreased range of motion. No back pain.  Psych:  No change in mood or affect. No depression or anxiety. No memory loss.   Past Medical History  Diagnosis Date  . Hypertensive heart disease     Hypertensive heart disease with prior episodes of CHF  . Hyperlipidemia   . Hypertension   . Gastroesophageal reflux disease   . Osteoarthritis   . Peptic ulcer disease   . Morbid obesity (HCC)     /sleep apnea  . Allergic rhinitis     plus h/o asthma  . Tobacco abuse, in remission     Cigarettes discontinued in 2010  . CHF (congestive heart failure) (HCC)   . A-fib (HCC)     2015   Past Surgical History  Procedure Laterality Date  . None     Social History:  reports that he quit smoking about 7 years ago. His smoking use included Cigarettes. He started smoking about 26 years ago. He has a 10 pack-year smoking history. He has never used smokeless tobacco. He reports that he does not drink alcohol or use illicit drugs.  Allergies  Allergen Reactions  . Bee Venom Shortness Of Breath and Swelling  . Aspirin Other (See Comments)    Chewable 81 mg tablets upsets patients stomach    Family History  Problem Relation Age of Onset  . Colon cancer Neg Hx   . Inflammatory bowel disease Neg Hx   . Liver disease Neg Hx     Prior to Admission medications  Medication Sig Start Date End Date Taking? Authorizing Provider  beclomethasone (QVAR) 80 MCG/ACT inhaler Inhale 1 puff into the lungs as needed (shortness of breath). Patient taking differently: Inhale 2 puffs into the lungs 2 (two) times daily.  04/04/14  Yes Lesle Chris Black, NP  diltiazem (CARDIZEM CD) 240 MG 24 hr capsule Take 1 and 1/2 tab in the morning 08/28/14  Yes [name], MD  furosemide (LASIX) 20 MG tablet Take 1 tablet (20 mg total) by mouth  daily. Patient taking differently: Take 40 mg by mouth daily.  09/18/14  Yes [name], MD  hydrALAZINE (APRESOLINE) 50 MG tablet Take 50 mg by mouth 2 (two) times daily. 02/22/15  Yes Historical Provider, MD  metoprolol succinate (TOPROL-XL) 50 MG 24 hr tablet Take 1 tablet (50 mg total) by mouth at bedtime. Patient taking differently: Take 50 mg by mouth daily as needed (heart rate).  02/26/15  Yes [name], MD  hydrALAZINE (APRESOLINE) 100 MG tablet Take 1 tablet (100 mg total) by mouth 2 (two) times daily. Patient not taking: Reported on 04/01/2015 12/17/14   [name], MD  omeprazole (PRILOSEC) 20 MG capsule Take 1 capsule (20 mg total) by mouth daily. Patient not taking: Reported on 04/01/2015 02/27/15   Anice Paganini, NP   Physical Exam: Filed Vitals:   04/01/15 0930 04/01/15 1030 04/01/15 1045 04/01/15 1100  BP: 152/94 133/102  158/107  Pulse: 56 89 95 97  Temp:      TempSrc:      Resp: Height:      Weight:      SpO2: 98% 99% 98% 98%    Wt Readings from Last 3 Encounters:  04/01/15 152.409 kg (336 lb)  12/17/14 142.792 kg (314 lb 12.8 oz)  08/28/14 141.069 kg (311 lb)    General:  Appears calm and comfortable. Lying in bed.  Eyes: PERRL, normal lids, irises & conjunctiva ENT: grossly normal hearing, lips & tongue Neck: no LAD, masses or thyromegaly Cardiovascular: Irregular rhythm and regular rate no m/r/g.  Telemetry: Afib, no arrhythmias  Respiratory:Crackles on the right side. Normal respiratory effort. Abdomen: soft, ntnd Skin: no rash or induration seen on limited exam Musculoskeletal: grossly normal tone BUE/BLE. RLE there is an outpouching of soft tissue that is chronic and nontender. No signs of infection. No significant edema in BLE Psychiatric: grossly normal mood and affect, speech fluent and appropriate Neurologic: grossly non-focal.          Labs on Admission:  Basic Metabolic Panel:  Recent Labs Lab 04/01/15 0836   NA 142  K 3.3*  CL 106  CO2 26  GLUCOSE 99  BUN 13  CREATININE 0.99  CALCIUM 8.0*   Liver Function Tests:  Recent Labs Lab 04/01/15 0836  AST 29  ALT 24  ALKPHOS 51  BILITOT 2.1*  PROT 6.6  ALBUMIN 3.9   CBC:  Recent Labs Lab 04/01/15 0836  WBC 8.4  NEUTROABS 7.2  HGB 13.4  HCT 39.5  MCV 87.0  PLT 178   Cardiac Enzymes:  Recent Labs Lab 04/01/15 0836  TROPONINI 0.04*    BNP (last 3 results)  Recent Labs  06/28/14 0309 08/08/14 1137 04/01/15 0836  BNP 396.0* 244.0* 370.0*    ProBNP (last 3 results)  Recent Labs  04/02/14 1031  PROBNP 746.5*     Radiological Exams on Admission: Dg Chest 2 View  04/01/2015   CLINICAL DATA:  Shortness of breath  and cough for 2 weeks  EXAM: CHEST  2 VIEW  COMPARISON:  August 08, 2014  FINDINGS: There is airspace consolidation in portions of the right middle and lower lobes. Lungs elsewhere clear. Heart is mildly enlarged with pulmonary vascularity within normal limits. No adenopathy. No bone lesions.  IMPRESSION: Airspace consolidation in portions the right middle and lower lobes, most likely due to pneumonia. Lungs elsewhere clear. There is mild cardiomegaly. Followup PA and lateral chest radiographs recommended in 3-4 weeks following trial of antibiotic therapy to ensure resolution and exclude underlying malignancy.   Electronically Signed   By: Bretta Bang III M.D.   On: 04/01/2015 08:30    EKG: Independently reviewed Afib without any acute changes.   Assessment/Plan Principal Problem:   CAP (community acquired pneumonia) Active Problems:   Hyperlipidemia   Essential hypertension   Asthma   Sleep apnea   A-fib (HCC)   Chronic diastolic CHF (congestive heart failure) (HCC)   Acute respiratory failure with hypoxia (HCC)   Hypokalemia   1. CAP/ Lobar pneumonia,.revealed on CXR. Patient started on Azithromycin and Ceftriaxone upon admission. Will check urinary antigens for legionella and  pneumococcal.  He is afebrile with WBC and lactic acid WNL. Blood cultures pending.  2. Acute respiratory failure with hypoxia. Patient does not have home oxygen was noted to be hypoxic on admission and placed on 2L [place]. Will wean oxygen as tolerated.  3. Chronic diastolic CHF, BNP 370 upon admission. Volume status appears to compensated. Will continue outpatient dose of lasix.   4. Asthma exacerbation. Will continue home inhalers, no evidence of wheezing at this time.Will hold off on steroids at this time.  5. Chronic Atrial fibrillation, RVR. Will continue metoprolol and dilzatim. He does not report being on anticoagulations. Will defer decision to anticoagulate to PCP.  6. Elevated troponin. Likely related to demand ischemia in the setting of infection. Monitor on telemetry and cycle cardiac markers.  7. HTN. Continue outpatient treatment.  8. HLD. Continue statins.  9. Sleep apnea.  Continue outpatient treatment  Code Status: Full DVT Prophylaxis: SCDs Family Communication: Mom bedside. Discussed with patient who understands and has no concerns at this time. Disposition Plan: Admit to telemetry bed.   Time spent: 55 minutes   Erick Blinks, MD [facility] Pager [phone]   By signing my name below, I, Zadie Cleverly, attest that this documentation has been prepared under the direction and in the presence of Erick Blinks, MD. Electronically signed: Zadie Cleverly, Scribe. 04/01/2015 10:55 AM  I, Dr. Erick Blinks, personally performed the services described in this documentaiton. All medical record entries made by the scribe were at my direction and in my presence. I have reviewed the chart and agree that the record reflects my personal performance and is accurate and complete  Erick Blinks, MD, 04/01/2015 11:36 AM

## 2015-04-01 NOTE — Progress Notes (Signed)
ANTIBIOTIC CONSULT NOTE - INITIAL  Pharmacy Consult for Zithromax and Rocephin (Renal adjustment if needed) Indication: pneumonia  Allergies  Allergen Reactions  . Bee Venom Shortness Of Breath and Swelling  . Aspirin Other (See Comments)    Chewable 81 mg tablets upsets patients stomach    Patient Measurements: Height: 6' (182.9 cm) Weight: (!) 336 lb (152.409 kg) IBW/kg (Calculated) : 77.6  Vital Signs: Temp: 98.4 F (36.9 C) (10/10 1237) Temp Source: Oral (10/10 1237) BP: 162/115 mmHg (10/10 1237) Pulse Rate: 86 (10/10 1237) Intake/Output from previous day:   Intake/Output from this shift: Total I/O In: -  Out: 725 [Urine:725]  Labs:  Recent Labs  04/01/15 0836  WBC 8.4  HGB 13.4  PLT 178  CREATININE 0.99   Estimated Creatinine Clearance: 141.8 mL/min (by C-G formula based on Cr of 0.99). No results for input(s): VANCOTROUGH, VANCOPEAK, VANCORANDOM, GENTTROUGH, GENTPEAK, GENTRANDOM, TOBRATROUGH, TOBRAPEAK, TOBRARND, AMIKACINPEAK, AMIKACINTROU, AMIKACIN in the last 72 hours.   Microbiology: Recent Results (from the past 720 hour(s))  Blood culture (routine x 2)     Status: None (Preliminary result)   Collection Time: 04/01/15  9:52 AM  Result Value Ref Range Status   Specimen Description BLOOD  Final   Special Requests Normal  Final   Culture NO GROWTH <12 HOURS  Final   Report Status PENDING  Incomplete  Blood culture (routine x 2)     Status: None (Preliminary result)   Collection Time: 04/01/15  9:59 AM  Result Value Ref Range Status   Specimen Description BLOOD  Final   Special Requests Normal  Final   Culture NO GROWTH <12 HOURS  Final   Report Status PENDING  Incomplete    Medical History: Past Medical History  Diagnosis Date  . Hypertensive heart disease     Hypertensive heart disease with prior episodes of CHF  . Hyperlipidemia   . Hypertension   . Gastroesophageal reflux disease   . Osteoarthritis   . Peptic ulcer disease   . Morbid  obesity (HCC)     /sleep apnea  . Allergic rhinitis     plus h/o asthma  . Tobacco abuse, in remission     Cigarettes discontinued in 2010  . CHF (congestive heart failure) (HCC)   . A-fib [name])     2015   Assessment: 49 y.o. male with history of HTN, CHF, Afib with RVR, and HLD presented with complaints of shortness of breath and productive cough, with pinkish sputum that onset two weeks ago. He admits subjective fever, chills, increased wheezing, vomiting, fatigue, rapid heart rate and swelling in right leg. He has a prescription for lasix that he reports taking as needed with noticeable diuresis. Chest pain is located in central of chest that is not radiating and is improving without any known alleviating factors. He does report worsening of his pain with coughing. He has progressive generalized weakness over the past two weeks. His symptoms have progressively worsening to the point he has difficulty walking across the room. Reports he stop smoking 10 years ago  Goal of Therapy:  Eradicate infection.  Plan:  Continue current Rx No renal adjustment needed Switch Zithromax to PO when improved  Margo Aye, Raahim Shartzer A 04/01/2015,12:54 PM

## 2015-04-01 NOTE — ED Notes (Addendum)
Patient c/o shortness of breath that started last night, used inhaler at 0330. Sats were 89% this morning when EMS arrived, does not wear home oxygen. Hx of asthma and chronic bronchitis. Has appt. With Dr. Juanetta Gosling this morning. Patient reports coughing up pink tinged sputum last two weeks.

## 2015-04-01 NOTE — ED Provider Notes (Signed)
CSN: [id number]     Arrival date & time 04/01/15  0758 History   First MD Initiated Contact with Patient 04/01/15 0813     Chief Complaint  Patient presents with  . Shortness of Breath     (Consider location/radiation/quality/duration/timing/severity/associated sxs/prior Treatment) Patient is a 49 y.o. male presenting with shortness of breath. The history is provided by the patient. No language interpreter was used.  Shortness of Breath Severity:  Moderate Onset quality:  Gradual Duration:  1 day Timing:  Constant Progression:  Worsening Chronicity:  New Context: URI   Relieved by:  Nothing Worsened by:  Nothing tried Ineffective treatments:  None tried Associated symptoms: hemoptysis and sputum production   Associated symptoms: no sore throat   Risk factors: no hx of cancer and no obesity   Pt rports he uses an inhaler at home.  He does not use home 02.  Pt was 89% today before 2 liters of 02. Pt has a history of a fib.  Pt has asthma and chronic bronchitis.   Past Medical History  Diagnosis Date  . Hypertensive heart disease     Hypertensive heart disease with prior episodes of CHF  . Hyperlipidemia   . Hypertension   . Gastroesophageal reflux disease   . Osteoarthritis   . Peptic ulcer disease   . Morbid obesity (HCC)     /sleep apnea  . Allergic rhinitis     plus h/o asthma  . Tobacco abuse, in remission     Cigarettes discontinued in 2010  . CHF (congestive heart failure) (HCC)   . A-fib (HCC)     2015   Past Surgical History  Procedure Laterality Date  . None     Family History  Problem Relation Age of Onset  . Colon cancer Neg Hx   . Inflammatory bowel disease Neg Hx   . Liver disease Neg Hx    Social History  Substance Use Topics  . Smoking status: Former Smoker -- 0.50 packs/day for 20 years    Types: Cigarettes    Start date: 04/26/1988    Quit date: 06/23/2007  . Smokeless tobacco: Never Used     Comment: 1 ppd former smoker  . Alcohol Use:  No     Comment: no etoh since 2009    Review of Systems  HENT: Negative for sore throat.   Respiratory: Positive for hemoptysis, sputum production and shortness of breath.   All other systems reviewed and are negative.     Allergies  Bee venom and Aspirin  Home Medications   Prior to Admission medications   Medication Sig Start Date End Date Taking? Authorizing Provider  beclomethasone (QVAR) 80 MCG/ACT inhaler Inhale 1 puff into the lungs as needed (shortness of breath). Patient taking differently: Inhale 2 puffs into the lungs 2 (two) times daily.  04/04/14   Gwenyth Bender, NP  diltiazem (CARDIZEM CD) 240 MG 24 hr capsule Take 1 and 1/2 tab in the morning 08/28/14   [name], MD  furosemide (LASIX) 20 MG tablet Take 1 tablet (20 mg total) by mouth daily. Patient taking differently: Take 40 mg by mouth daily.  09/18/14   [name], MD  hydrALAZINE (APRESOLINE) 100 MG tablet Take 1 tablet (100 mg total) by mouth 2 (two) times daily. 12/17/14   [name], MD  metoprolol succinate (TOPROL-XL) 50 MG 24 hr tablet Take 1 tablet (50 mg total) by mouth at bedtime. 02/26/15   [name], MD  omeprazole (PRILOSEC) 20 MG capsule Take 1 capsule (20 mg total) by mouth daily. 02/27/15   Anice Paganini, NP   BP 179/113 mmHg  Pulse 104  Temp(Src) 99.6 F (37.6 C) (Oral)  Resp 34  Ht 6' (1.829 m)  Wt 336 lb (152.409 kg)  BMI 45.56 kg/m2  SpO2 93% Physical Exam  Constitutional: He is oriented to person, place, and time. He appears well-developed and well-nourished.  HENT:  Head: Normocephalic.  Eyes: Conjunctivae and EOM are normal. Pupils are equal, round, and reactive to light.  Neck: Normal range of motion. Neck supple.  Cardiovascular: Normal rate.   Pulmonary/Chest: Effort normal.  Abdominal: He exhibits no distension.  Musculoskeletal: Normal range of motion.  Neurological: He is alert and oriented to person, place, and time.  Skin: Skin is warm.   Psychiatric: He has a normal mood and affect.  Nursing note and vitals reviewed.   ED Course  Procedures (including critical care time) Labs Review Labs Reviewed  COMPREHENSIVE METABOLIC PANEL - Abnormal; Notable for the following:    Potassium 3.3 (*)    Calcium 8.0 (*)    Total Bilirubin 2.1 (*)    All other components within normal limits  TROPONIN I - Abnormal; Notable for the following:    Troponin I 0.04 (*)    All other components within normal limits  BRAIN NATRIURETIC PEPTIDE - Abnormal; Notable for the following:    B Natriuretic Peptide 370.0 (*)    All other components within normal limits  CBC WITH DIFFERENTIAL/PLATELET    Imaging Review Dg Chest 2 View  04/01/2015   CLINICAL DATA:  Shortness of breath and cough for 2 weeks  EXAM: CHEST  2 VIEW  COMPARISON:  August 08, 2014  FINDINGS: There is airspace consolidation in portions of the right middle and lower lobes. Lungs elsewhere clear. Heart is mildly enlarged with pulmonary vascularity within normal limits. No adenopathy. No bone lesions.  IMPRESSION: Airspace consolidation in portions the right middle and lower lobes, most likely due to pneumonia. Lungs elsewhere clear. There is mild cardiomegaly. Followup PA and lateral chest radiographs recommended in 3-4 weeks following trial of antibiotic therapy to ensure resolution and exclude underlying malignancy.   Electronically Signed   By: Bretta Bang III M.D.   On: 04/01/2015 08:30   I have personally reviewed and evaluated these images and lab results as part of my medical decision-making.   EKG Interpretation None      MDM  Pt has a slight elevation in troponin  Increased [id number].    Pt breathing better after neb and on 02.  Pt's chest xray ashows pneumonia.   Final diagnoses:  None    Pt given rocephin and zithromax iv.   I will consult hospitalist for admission    Elson Areas, PA-C 04/01/15 1138  Courteney Lyn Corlis Leak, MD 04/01/15 1529

## 2015-04-01 NOTE — Progress Notes (Signed)
Pt stated unable to take both Potassium tablets due to make his stomach hurt.  Pt took 1 tablet, given.  Notified Dr Kerry Hough, admitting for Dr Juanetta Gosling.

## 2015-04-01 NOTE — ED Notes (Signed)
PA at bedside.

## 2015-04-01 NOTE — ED Notes (Signed)
MD at bedside. 

## 2015-04-02 DIAGNOSIS — I5032 Chronic diastolic (congestive) heart failure: Secondary | ICD-10-CM | POA: Diagnosis not present

## 2015-04-02 DIAGNOSIS — I482 Chronic atrial fibrillation: Secondary | ICD-10-CM | POA: Diagnosis not present

## 2015-04-02 DIAGNOSIS — J9601 Acute respiratory failure with hypoxia: Secondary | ICD-10-CM | POA: Diagnosis not present

## 2015-04-02 DIAGNOSIS — J189 Pneumonia, unspecified organism: Secondary | ICD-10-CM | POA: Diagnosis not present

## 2015-04-02 LAB — CBC
HCT: 41.1 % (ref 39.0–52.0)
HEMOGLOBIN: 13.6 g/dL (ref 13.0–17.0)
MCH: 29.2 pg (ref 26.0–34.0)
MCHC: 33.1 g/dL (ref 30.0–36.0)
MCV: 88.4 fL (ref 78.0–100.0)
Platelets: 190 10*3/uL (ref 150–400)
RBC: 4.65 MIL/uL (ref 4.22–5.81)
RDW: 13.6 % (ref 11.5–15.5)
WBC: 5.3 10*3/uL (ref 4.0–10.5)

## 2015-04-02 LAB — BASIC METABOLIC PANEL
Anion gap: 6 (ref 5–15)
BUN: 17 mg/dL (ref 6–20)
CHLORIDE: 107 mmol/L (ref 101–111)
CO2: 27 mmol/L (ref 22–32)
CREATININE: 1.01 mg/dL (ref 0.61–1.24)
Calcium: 8 mg/dL — ABNORMAL LOW (ref 8.9–10.3)
GFR calc Af Amer: >60 mL/min (ref >60)
GFR calc non Af Amer: >60 mL/min (ref >60)
GLUCOSE: 92 mg/dL (ref 65–99)
Potassium: 3.2 mmol/L — ABNORMAL LOW (ref 3.5–5.1)
SODIUM: 140 mmol/L (ref 135–145)

## 2015-04-02 LAB — HIV ANTIBODY (ROUTINE TESTING W REFLEX): HIV SCREEN 4TH GENERATION: NONREACTIVE

## 2015-04-02 LAB — TROPONIN I: Troponin I: 0.04 ng/mL — ABNORMAL HIGH (ref <0.031)

## 2015-04-02 MED ORDER — LEVOFLOXACIN 750 MG PO TABS: 750.0000 mg | ORAL_TABLET | Freq: Every day | ORAL | Status: DC

## 2015-04-02 MED ORDER — OMEPRAZOLE 20 MG PO CPDR: 20.0000 mg | DELAYED_RELEASE_CAPSULE | Freq: Every day | ORAL | Status: DC

## 2015-04-02 MED ORDER — GUAIFENESIN ER 600 MG PO TB12: 1200.0000 mg | ORAL_TABLET | Freq: Two times a day (BID) | ORAL | Status: DC

## 2015-04-02 MED ORDER — LISINOPRIL 10 MG PO TABS
20.0000 mg | ORAL_TABLET | Freq: Every day | ORAL | Status: DC
  Administered 2015-04-02: 20 mg via ORAL
  Filled 2015-04-02: qty 2

## 2015-04-02 MED ORDER — POTASSIUM CHLORIDE CRYS ER 20 MEQ PO TBCR: 40.0000 meq | EXTENDED_RELEASE_TABLET | Freq: Once | ORAL | Status: DC

## 2015-04-02 MED ORDER — RIVAROXABAN 20 MG PO TABS: 20.0000 mg | ORAL_TABLET | Freq: Every day | ORAL | Status: DC

## 2015-04-02 MED ORDER — CLONIDINE HCL 0.1 MG PO TABS: 0.1000 mg | ORAL_TABLET | Freq: Two times a day (BID) | ORAL | Status: DC

## 2015-04-02 NOTE — Discharge Summary (Signed)
Physician Discharge Summary  [name]:[id number] DOB: 1966/05/08 DOA: 04/01/2015  PCP: Fredirick Maudlin, MD  Admit date: 04/01/2015 Discharge date: 04/02/2015  Time spent: 35 minutes  Recommendations for Outpatient Follow-up:  1. Follow up with PCP within 1-2 weeks, 2. Complete course of oral abx.  3. Repeat CXR within 3-4 weeks to ensure resolution of PNA.   Discharge Diagnoses:  Principal Problem:   CAP (community acquired pneumonia) Active Problems:   Hyperlipidemia   Essential hypertension   Asthma   Sleep apnea   A-fib (HCC)   Chronic diastolic CHF (congestive heart failure) (HCC)   Acute respiratory failure with hypoxia (HCC)   Hypokalemia   Elevated troponin   Discharge Condition: Improved  Diet recommendation: Heart healthy  Filed Weights   04/01/15 0806 04/01/15 1237  Weight: 152.409 kg (336 lb) 141.386 kg (311 lb 11.2 oz)    History of present illness:  49 y.o. male with history of HTN, CHF, Afib with RVR, and HLD presented with complaints of shortness of breath. While in the ED, CXR revealed airspace consolidation in portions the right middle and lower lobes, most likely pneumonia. Started on Azithromycin and Ceftriaxone. Admitted for further evaluation and management.    Hospital Course:  49 year old male with history of HTN, CHF, Afib with RVR, and HLD presented with complaints of shortness of breath. He was found to have CAP per CXR. He was treated with Azithromycin and Ceftriaxone and remained afebrile. He quickly weaned of 2L on RA and is breathing comfortably. His blood cultures showed no growth to date but are still pending. Chronic diastolic, CHF was stable on oral lasix with volume status compensated. Asthma exacerbation improved with home inhalers. He did not receive steroids throughout hospital course and does not have any evidence of wheezing. Chronic atrial fibrillation was controlled with metoprolol and diltiazem. He has been prescribed  Xarelto per his primary cardiologist for anticoagulation. Elevated troponin suspected to be related to demand ischemia in the setting of infection was cycled and patient was monitored on telemetry. He is not having any chest pain or SOB and is feeling significantly improved. He is ready for discharge today.  1. HTN. Patient's BP was running high throughout his hospital course. He reports intolerance to Lisinopril and Hydralazine. He is already on diltiazem and metoprolol. Will start on low dose clonidine.  2. HLD. Continued statins.  3. Sleep apnea. Continued outpatient treatment   Consultants:  None  Procedures:  None   Discharge Exam: Filed Vitals:   04/02/15 0542  BP: 161/100  Pulse: 84  Temp: 98.4 F (36.9 C)  Resp: 20     General: Appears calm and comfortable  Cardiovascular: Irregular rate, no m/r/g. No LE edema.  Telemetry: SR, no arrhythmias   Respiratory: CTA bilaterally, no w/r/r. Normal respiratory effort.  Psychiatric: grossly normal mood and affect, speech fluent and appropriate  Neurologic: grossly non-focal.  Discharge Instructions    Current Discharge Medication List    CONTINUE these medications which have NOT CHANGED   Details  beclomethasone (QVAR) 80 MCG/ACT inhaler Inhale 1 puff into the lungs as needed (shortness of breath). Qty: 1 Inhaler, Refills: 12    diltiazem (CARDIZEM CD) 240 MG 24 hr capsule Take 1 and 1/2 tab in the morning Qty: 90 capsule, Refills: 3    furosemide (LASIX) 20 MG tablet Take 1 tablet (20 mg total) by mouth daily. Qty: 90 tablet, Refills: 3    hydrALAZINE (APRESOLINE) 50 MG tablet Take 50 mg  by mouth 2 (two) times daily. Refills: 3    metoprolol succinate (TOPROL-XL) 50 MG 24 hr tablet Take 1 tablet (50 mg total) by mouth at bedtime. Qty: 90 tablet, Refills: 3    omeprazole (PRILOSEC) 20 MG capsule Take 1 capsule (20 mg total) by mouth daily. Qty: 30 capsule, Refills: 11       Allergies  Allergen  Reactions  . Bee Venom Shortness Of Breath and Swelling  . Aspirin Other (See Comments)    Chewable 81 mg tablets upsets patients stomach      The results of significant diagnostics from this hospitalization (including imaging, microbiology, ancillary and laboratory) are listed below for reference.    Significant Diagnostic Studies: Dg Chest 2 View  04/01/2015   CLINICAL DATA:  Shortness of breath and cough for 2 weeks  EXAM: CHEST  2 VIEW  COMPARISON:  August 08, 2014  FINDINGS: There is airspace consolidation in portions of the right middle and lower lobes. Lungs elsewhere clear. Heart is mildly enlarged with pulmonary vascularity within normal limits. No adenopathy. No bone lesions.  IMPRESSION: Airspace consolidation in portions the right middle and lower lobes, most likely due to pneumonia. Lungs elsewhere clear. There is mild cardiomegaly. Followup PA and lateral chest radiographs recommended in 3-4 weeks following trial of antibiotic therapy to ensure resolution and exclude underlying malignancy.   Electronically Signed   By: Bretta Bang III M.D.   On: 04/01/2015 08:30    Microbiology: Recent Results (from the past 240 hour(s))  Blood culture (routine x 2)     Status: None (Preliminary result)   Collection Time: 04/01/15  9:52 AM  Result Value Ref Range Status   Specimen Description BLOOD RIGHT ANTECUBITAL  Final   Special Requests BOTTLES DRAWN AEROBIC AND ANAEROBIC 6CC EACH  Final   Culture NO GROWTH 1 DAY  Final   Report Status PENDING  Incomplete  Blood culture (routine x 2)     Status: None (Preliminary result)   Collection Time: 04/01/15  9:59 AM  Result Value Ref Range Status   Specimen Description BLOOD RIGHT HAND  Final   Special Requests BOTTLES DRAWN AEROBIC AND ANAEROBIC 6CC EACH  Final   Culture NO GROWTH 1 DAY  Final   Report Status PENDING  Incomplete     Labs: Basic Metabolic Panel:  Recent Labs Lab 04/01/15 0836 04/02/15 0548  NA 142 140  K  3.3* 3.2*  CL 106 107  CO2 26 27  GLUCOSE 99 92  BUN 13 17  CREATININE 0.99 1.01  CALCIUM 8.0* 8.0*   Liver Function Tests:  Recent Labs Lab 04/01/15 0836  AST 29  ALT 24  ALKPHOS 51  BILITOT 2.1*  PROT 6.6  ALBUMIN 3.9   CBC:  Recent Labs Lab 04/01/15 0836 04/02/15 0548  WBC 8.4 5.3  NEUTROABS 7.2  --   HGB 13.4 13.6  HCT 39.5 41.1  MCV 87.0 88.4  PLT 178 190   Cardiac Enzymes:  Recent Labs Lab 04/01/15 0836 04/01/15 1326 04/01/15 1829 04/02/15 0028  TROPONINI 0.04* 0.03 0.04* 0.04*   BNP: BNP (last 3 results)  Recent Labs  06/28/14 0309 08/08/14 1137 04/01/15 0836  BNP 396.0* 244.0* 370.0*      By signing my name below, I, Edman Circle attest that this documentation has been prepared under the direction and in the presence of Erick Blinks, MD  Electronically signed: Edman Circle  04/02/2015 11:15 AM   Signed:  Erick Blinks, M.D.  [facility] 04/02/2015, 11:21 AM  I, Dr. Erick Blinks, personally performed the services described in this documentaiton. All medical record entries made by the scribe were at my direction and in my presence. I have reviewed the chart and agree that the record reflects my personal performance and is accurate and complete  Erick Blinks, MD, 04/02/2015 8:09 PM

## 2015-04-02 NOTE — Care Management Note (Signed)
Case Management Note  Patient Details  Name: [name] MRN: [id number] Date of Birth: [birth year]  Subjective/Objective:                  Pt admitted from home with pneumonia. Pt lives with family and will return home at discharge. Pt is independent with ADL's. Pt does not have a PCP.  Action/Plan: CM arranged follow up with [facility]. Clinic was not able to make appt due to computer issues but clinic has pts information to call pt with appt time. Pt also has the contact information for the clinic if they do not call him within 24 hours. Clinic is aware that pt will need CXR in 4 weeks. Pt and pts nurse aware of discharge arrangments.  Expected Discharge Date:                  Expected Discharge Plan:  Home/Self Care  In-House Referral:  NA  Discharge planning Services  CM Consult, Follow-up appt scheduled  Post Acute Care Choice:  NA Choice offered to:  NA  DME Arranged:    DME Agency:     HH Arranged:    HH Agency:     Status of Service:  Completed, signed off  Medicare Important Message Given:    Date Medicare IM Given:    Medicare IM give by:    Date Additional Medicare IM Given:    Additional Medicare Important Message give by:     If discussed at Long Length of Stay Meetings, dates discussed:    Additional Comments:  Cheryl Flash, RN 04/02/2015, 1:07 PM

## 2015-04-02 NOTE — Progress Notes (Signed)
Patient refused to take the Hydralazine ordered. He states "that medicine makes me sick on the stomach and makes my stomach ulcer hurt. That's why I stopped taking it at home."  Patient advised that this medicine is used to keep his BP lowered.  He was also advised to mention this to the doctor so that other arrangements can be made to control his BP, verbalized understanding.

## 2015-04-06 LAB — CULTURE, BLOOD (ROUTINE X 2)
CULTURE: NO GROWTH
CULTURE: NO GROWTH

## 2015-04-15 ENCOUNTER — Other Ambulatory Visit: Payer: Self-pay | Admitting: Cardiovascular Disease

## 2015-04-18 ENCOUNTER — Telehealth: Payer: Self-pay | Admitting: *Deleted

## 2015-04-18 MED ORDER — DILTIAZEM HCL ER COATED BEADS 240 MG PO CP24: ORAL_CAPSULE | ORAL | Status: DC

## 2015-04-18 NOTE — Telephone Encounter (Signed)
Received call from [facility] regarding Diltiazem CD 240mg  prescription.  Directions state 1 1/2 tablet daily.  This medication can not be broken in half as he has a capsule.    Call placed to patient for verification.  Patient stated that he has been taking 1 cap on one day & 2 caps the other, alternating.  Also, noted in chart a recent ED visit to [facility] on 04/01/15.  Per this note, Clonidine was added due to elevated BP.    Patient missed his follow up that was scheduled for September.  Rescheduled this follow up for 05/01/2015 in [place] with Dr. Purvis Sheffield.  Cautioned patient to call if can't make appointment due to $50.00 no show fee.  Reminded to bring all medications to [facility].  Patient verbalized understanding.  Patient stated he should have enough on the Diltiazem to get him till this visit.

## 2015-05-01 ENCOUNTER — Encounter: Payer: Self-pay | Admitting: Cardiovascular Disease

## 2015-05-01 ENCOUNTER — Ambulatory Visit (INDEPENDENT_AMBULATORY_CARE_PROVIDER_SITE_OTHER): Payer: Medicare Other | Admitting: Cardiovascular Disease

## 2015-05-01 VITALS — BP 170/108 | HR 79 | Ht 72.0 in | Wt 324.0 lb

## 2015-05-01 DIAGNOSIS — I4729 Other ventricular tachycardia: Secondary | ICD-10-CM

## 2015-05-01 DIAGNOSIS — I472 Ventricular tachycardia: Secondary | ICD-10-CM | POA: Diagnosis not present

## 2015-05-01 DIAGNOSIS — I5032 Chronic diastolic (congestive) heart failure: Secondary | ICD-10-CM

## 2015-05-01 DIAGNOSIS — Z9114 Patient's other noncompliance with medication regimen: Secondary | ICD-10-CM

## 2015-05-01 DIAGNOSIS — I10 Essential (primary) hypertension: Secondary | ICD-10-CM

## 2015-05-01 DIAGNOSIS — I482 Chronic atrial fibrillation: Secondary | ICD-10-CM

## 2015-05-01 DIAGNOSIS — I4821 Permanent atrial fibrillation: Secondary | ICD-10-CM

## 2015-05-01 MED ORDER — CLONIDINE HCL 0.2 MG PO TABS: 0.2000 mg | ORAL_TABLET | Freq: Two times a day (BID) | ORAL | Status: DC

## 2015-05-01 NOTE — Patient Instructions (Addendum)
   Increase Clonidine to 0.2mg  twice a day  - may take 2 tabs of your 0.1mg  tab twice a day till finish current supply.  New 0.2mg  tab sent to [facility] today. Continue all other medications.   Your physician wants you to follow up in: 6 months.  You will receive a reminder letter in the mail one-two months in advance.  If you don't receive a letter, please call our office to schedule the follow up appointment

## 2015-05-01 NOTE — Progress Notes (Signed)
Patient ID: [name], male   DOB: Oct 27, 1965, 49 y.o.   MRN: [id number]      SUBJECTIVE: The patient presents for follow-up of malignant hypertension, chronic diastolic heart failure, nonsustained ventricular tachycardia, and atrial fibrillation. I previously prescribed Eliquis but he decided not to take it because he was scared to do so.   He was hospitalized for community acquired pneumonia with rapid atrial fibrillation in October 2016. He reported being intolerant to both lisinopril and hydralazine. Clonidine was started.  He denies chest pain and shortness of breath. He wears sunglasses because bright lights bother him.   Review of Systems: As per "subjective", otherwise negative.  Allergies  Allergen Reactions  . Bee Venom Shortness Of Breath and Swelling  . Aspirin Other (See Comments)    Chewable 81 mg tablets upsets patients stomach    Current Outpatient Prescriptions  Medication Sig Dispense Refill  . beclomethasone (QVAR) 80 MCG/ACT inhaler Inhale 2 puffs into the lungs 2 (two) times daily.    . cloNIDine (CATAPRES) 0.1 MG tablet Take 1 tablet (0.1 mg total) by mouth 2 (two) times daily. 60 tablet 11  . diltiazem (CARDIZEM CD) 240 MG 24 hr capsule Taking one tab, alternating with 2 the next day    . furosemide (LASIX) 20 MG tablet Take 20 mg by mouth daily.    Marland Kitchen guaiFENesin (MUCINEX) 600 MG 12 hr tablet Take 2 tablets (1,200 mg total) by mouth 2 (two) times daily. 30 tablet 0  . levofloxacin (LEVAQUIN) 750 MG tablet Take 1 tablet (750 mg total) by mouth daily. 6 tablet 0  . metoprolol succinate (TOPROL-XL) 50 MG 24 hr tablet Take 50 mg by mouth every other day. Take with or immediately following a meal.    . omeprazole (PRILOSEC) 20 MG capsule Take 1 capsule (20 mg total) by mouth daily. 30 capsule 11  . rivaroxaban (XARELTO) 20 MG TABS tablet Take 1 tablet (20 mg total) by mouth daily with supper. (Patient not taking: Reported on 05/01/2015) 30 tablet    No current  facility-administered medications for this visit.    Past Medical History  Diagnosis Date  . Hypertensive heart disease     Hypertensive heart disease with prior episodes of CHF  . Hyperlipidemia   . Hypertension   . Gastroesophageal reflux disease   . Osteoarthritis   . Peptic ulcer disease   . Morbid obesity (HCC)     /sleep apnea  . Allergic rhinitis     plus h/o asthma  . Tobacco abuse, in remission     Cigarettes discontinued in 2010  . CHF (congestive heart failure) (HCC)   . A-fib (HCC)     2015    Past Surgical History  Procedure Laterality Date  . None      Social History   Social History  . Marital Status: Divorced    Spouse Name: N/A  . Number of Children: 3  . Years of Education: N/A   Occupational History  . disabled    Social History Main Topics  . Smoking status: Former Smoker -- 0.50 packs/day for 20 years    Types: Cigarettes    Start date: 04/26/1988    Quit date: 06/23/2007  . Smokeless tobacco: Never Used     Comment: 1 ppd former smoker  . Alcohol Use: No     Comment: no etoh since 2009  . Drug Use: No  . Sexual Activity: No   Other Topics Concern  . Not on file  Social History Narrative     Filed Vitals:   05/01/15 1126 05/01/15 1139  BP: 176/116 170/108  Pulse: 89 79  Height: 6' (1.829 m)   Weight: 324 lb (146.965 kg)   SpO2: 97%     PHYSICAL EXAM General: NAD, morbidly obese. HEENT: Wearing sunglasses. Neck: JVP difficult to assess. Lungs: Diminished sounds but no wheezes/rales. CV: Nondisplaced PMI. Irregular rhythm, normal S1/S2, no S3, no murmur. No bilateral pretibial and periankle edema. Chronic venous stasis bilaterally.  Abdomen: Morbidly obese.  Neurologic: Alert and oriented.  Psych: Normal affect. Skin: Right ankle lipoma. Musculoskeletal: No gross deformities. Extremities: No clubbing or cyanosis.   ECG: Most recent ECG reviewed.      ASSESSMENT AND PLAN: 1. Malignant hypertension: Remains  elevated and I doubt medication compliance. Had been prescribed hydralazine 100 mg bid but not taking it saying he didn't tolerate it. Again educated about risk of MI, CVA, and death. Will increase clonidine to 0.2 mg bid.  2. Atrial fibrillation: Continue long-acting diltiazem 240 mg daily. CHADSVASC score is 2 (CHF, HTN). He decided not to take Eliquis nor Xarelto, saying he developed a "reaction" to that as well. I informed of risk for stroke.  3. Chronic diastolic heart failure: Needs more optimal BP control. Will increase clonidine to 0.2 mg bid and encouraged medication compliance. Continue lasix 20 mg.  4. Nonsustained VT: No chest pain. Normal LV systolic function. If he were to have recurrences, I would consider stress testing.  Dispo: f/u 6 months.   Prentice Docker, M.D., F.A.C.C.

## 2015-05-13 ENCOUNTER — Emergency Department (HOSPITAL_COMMUNITY): Payer: Medicare Other

## 2015-05-13 ENCOUNTER — Emergency Department (HOSPITAL_COMMUNITY)
Admission: EM | Admit: 2015-05-13 | Discharge: 2015-05-13 | Disposition: A | Discharge status: Home / Self Care | Payer: Medicare Other | Attending: Emergency Medicine | Admitting: Emergency Medicine

## 2015-05-13 ENCOUNTER — Encounter (HOSPITAL_COMMUNITY): Payer: Self-pay | Admitting: Emergency Medicine

## 2015-05-13 DIAGNOSIS — R110 Nausea: Secondary | ICD-10-CM | POA: Diagnosis not present

## 2015-05-13 DIAGNOSIS — Z7901 Long term (current) use of anticoagulants: Secondary | ICD-10-CM | POA: Insufficient documentation

## 2015-05-13 DIAGNOSIS — I110 Hypertensive heart disease with heart failure: Secondary | ICD-10-CM | POA: Diagnosis not present

## 2015-05-13 DIAGNOSIS — R350 Frequency of micturition: Secondary | ICD-10-CM | POA: Diagnosis not present

## 2015-05-13 DIAGNOSIS — I509 Heart failure, unspecified: Secondary | ICD-10-CM | POA: Diagnosis not present

## 2015-05-13 DIAGNOSIS — Z9119 Patient's noncompliance with other medical treatment and regimen: Secondary | ICD-10-CM | POA: Diagnosis not present

## 2015-05-13 DIAGNOSIS — R600 Localized edema: Secondary | ICD-10-CM | POA: Insufficient documentation

## 2015-05-13 DIAGNOSIS — R1033 Periumbilical pain: Secondary | ICD-10-CM | POA: Diagnosis present

## 2015-05-13 DIAGNOSIS — R531 Weakness: Secondary | ICD-10-CM | POA: Diagnosis not present

## 2015-05-13 DIAGNOSIS — Z79899 Other long term (current) drug therapy: Secondary | ICD-10-CM | POA: Diagnosis not present

## 2015-05-13 DIAGNOSIS — Z7951 Long term (current) use of inhaled steroids: Secondary | ICD-10-CM | POA: Insufficient documentation

## 2015-05-13 DIAGNOSIS — R631 Polydipsia: Secondary | ICD-10-CM | POA: Diagnosis not present

## 2015-05-13 DIAGNOSIS — I10 Essential (primary) hypertension: Secondary | ICD-10-CM

## 2015-05-13 DIAGNOSIS — M1990 Unspecified osteoarthritis, unspecified site: Secondary | ICD-10-CM | POA: Diagnosis not present

## 2015-05-13 DIAGNOSIS — R079 Chest pain, unspecified: Secondary | ICD-10-CM | POA: Diagnosis not present

## 2015-05-13 DIAGNOSIS — R0602 Shortness of breath: Secondary | ICD-10-CM | POA: Insufficient documentation

## 2015-05-13 DIAGNOSIS — I4891 Unspecified atrial fibrillation: Secondary | ICD-10-CM | POA: Insufficient documentation

## 2015-05-13 DIAGNOSIS — R358 Other polyuria: Secondary | ICD-10-CM | POA: Insufficient documentation

## 2015-05-13 DIAGNOSIS — Z8711 Personal history of peptic ulcer disease: Secondary | ICD-10-CM | POA: Diagnosis not present

## 2015-05-13 DIAGNOSIS — Z87891 Personal history of nicotine dependence: Secondary | ICD-10-CM | POA: Diagnosis not present

## 2015-05-13 DIAGNOSIS — R42 Dizziness and giddiness: Secondary | ICD-10-CM | POA: Diagnosis not present

## 2015-05-13 DIAGNOSIS — K219 Gastro-esophageal reflux disease without esophagitis: Secondary | ICD-10-CM | POA: Insufficient documentation

## 2015-05-13 DIAGNOSIS — J45909 Unspecified asthma, uncomplicated: Secondary | ICD-10-CM | POA: Diagnosis not present

## 2015-05-13 DIAGNOSIS — Z91199 Patient's noncompliance with other medical treatment and regimen due to unspecified reason: Secondary | ICD-10-CM

## 2015-05-13 DIAGNOSIS — Z8669 Personal history of other diseases of the nervous system and sense organs: Secondary | ICD-10-CM | POA: Insufficient documentation

## 2015-05-13 DIAGNOSIS — R109 Unspecified abdominal pain: Secondary | ICD-10-CM

## 2015-05-13 LAB — URINALYSIS, ROUTINE W REFLEX MICROSCOPIC
GLUCOSE, UA: NEGATIVE mg/dL
HGB URINE DIPSTICK: NEGATIVE
KETONES UR: NEGATIVE mg/dL
Leukocytes, UA: NEGATIVE
Nitrite: NEGATIVE
PROTEIN: 100 mg/dL — AB
Specific Gravity, Urine: >1.03 — ABNORMAL HIGH (ref 1.005–1.030)
pH: 6 (ref 5.0–8.0)

## 2015-05-13 LAB — COMPREHENSIVE METABOLIC PANEL
ALBUMIN: 3.5 g/dL (ref 3.5–5.0)
ALT: 25 U/L (ref 17–63)
AST: 28 U/L (ref 15–41)
Alkaline Phosphatase: 46 U/L (ref 38–126)
Anion gap: 11 (ref 5–15)
BILIRUBIN TOTAL: 2.6 mg/dL — AB (ref 0.3–1.2)
BUN: 16 mg/dL (ref 6–20)
CALCIUM: 8.6 mg/dL — AB (ref 8.9–10.3)
CO2: 24 mmol/L (ref 22–32)
Chloride: 106 mmol/L (ref 101–111)
Creatinine, Ser: 1.15 mg/dL (ref 0.61–1.24)
GFR calc Af Amer: >60 mL/min (ref >60)
Glucose, Bld: 90 mg/dL (ref 65–99)
POTASSIUM: 3.1 mmol/L — AB (ref 3.5–5.1)
Sodium: 141 mmol/L (ref 135–145)
TOTAL PROTEIN: 6.1 g/dL — AB (ref 6.5–8.1)

## 2015-05-13 LAB — CBC
HEMATOCRIT: 36.8 % — AB (ref 39.0–52.0)
Hemoglobin: 12.3 g/dL — ABNORMAL LOW (ref 13.0–17.0)
MCH: 28.9 pg (ref 26.0–34.0)
MCHC: 33.4 g/dL (ref 30.0–36.0)
MCV: 86.4 fL (ref 78.0–100.0)
Platelets: 217 10*3/uL (ref 150–400)
RBC: 4.26 MIL/uL (ref 4.22–5.81)
RDW: 13.2 % (ref 11.5–15.5)
WBC: 5 10*3/uL (ref 4.0–10.5)

## 2015-05-13 LAB — LIPASE, BLOOD: LIPASE: 23 U/L (ref 11–51)

## 2015-05-13 LAB — URINE MICROSCOPIC-ADD ON
Bacteria, UA: NONE SEEN
Squamous Epithelial / LPF: NONE SEEN
WBC, UA: NONE SEEN WBC/hpf (ref 0–5)

## 2015-05-13 LAB — TROPONIN I
Troponin I: 0.05 ng/mL — ABNORMAL HIGH (ref <0.031)
Troponin I: 0.04 ng/mL — ABNORMAL HIGH (ref <0.031)

## 2015-05-13 LAB — BRAIN NATRIURETIC PEPTIDE: B NATRIURETIC PEPTIDE 5: 626 pg/mL — AB (ref 0.0–100.0)

## 2015-05-13 MED ORDER — POTASSIUM CHLORIDE CRYS ER 20 MEQ PO TBCR: 20.0000 meq | EXTENDED_RELEASE_TABLET | Freq: Every day | ORAL | Status: DC

## 2015-05-13 MED ORDER — ONDANSETRON HCL 4 MG/2ML IJ SOLN
4.0000 mg | Freq: Once | INTRAMUSCULAR | Status: AC
  Administered 2015-05-13: 4 mg via INTRAVENOUS
  Filled 2015-05-13: qty 2

## 2015-05-13 MED ORDER — POTASSIUM CHLORIDE CRYS ER 20 MEQ PO TBCR
40.0000 meq | EXTENDED_RELEASE_TABLET | Freq: Once | ORAL | Status: AC
  Administered 2015-05-13: 40 meq via ORAL
  Filled 2015-05-13: qty 2

## 2015-05-13 MED ORDER — DILTIAZEM HCL ER COATED BEADS 240 MG PO CP24
240.0000 mg | ORAL_CAPSULE | Freq: Once | ORAL | Status: DC
  Filled 2015-05-13: qty 1

## 2015-05-13 MED ORDER — MORPHINE SULFATE (PF) 4 MG/ML IV SOLN
4.0000 mg | Freq: Once | INTRAVENOUS | Status: AC
  Administered 2015-05-13: 4 mg via INTRAVENOUS
  Filled 2015-05-13: qty 1

## 2015-05-13 MED ORDER — FUROSEMIDE 40 MG PO TABS
40.0000 mg | ORAL_TABLET | Freq: Once | ORAL | Status: AC
  Administered 2015-05-13: 40 mg via ORAL
  Filled 2015-05-13: qty 1

## 2015-05-13 MED ORDER — CLONIDINE HCL 0.2 MG PO TABS
0.2000 mg | ORAL_TABLET | Freq: Once | ORAL | Status: AC
  Administered 2015-05-13: 0.2 mg via ORAL
  Filled 2015-05-13: qty 1

## 2015-05-13 MED ORDER — METOPROLOL SUCCINATE ER 50 MG PO TB24
50.0000 mg | ORAL_TABLET | Freq: Once | ORAL | Status: DC
Start: 2015-05-13 — End: 2015-05-13
  Filled 2015-05-13: qty 1

## 2015-05-13 NOTE — ED Notes (Addendum)
Patient states "I ate some popcorn Saturday and my stomach hurts and I've been really nauseated since." Patient also complaining of "voiding a lot, short of breath, dizzy, and weak."

## 2015-05-13 NOTE — Discharge Instructions (Signed)
It was our pleasure to provide your ER care today - we hope that you feel better.  Your blood pressure is very high today - it is very important that you take your blood pressure medication as prescribed by your doctor.  Take one extra of your lasix tablets tomorrow. Limit salt intake (Do not add additional salt to your foods)  Follow up with primary care doctor in the next 1-2 days for recheck, and recheck of your blood pressure.  Call office today to arrange follow up appointment.   From today's lab tests, your potassium level is mildly low (3.1) - eat plenty of fruits and vegetables, take potassium supplement as prescribed, and follow up with your doctor for recheck in 1 week.  Take your blood thinner medication as prescribed - failure to take this medication will put you at risk for stroke, blood clots, and heart failure.   Return to ER right away if worse, new symptoms, difficulty breathing, fevers, worsening or severe abdominal pain, chest pain, other concern.       Abdominal Pain, Adult Many things can cause abdominal pain. Usually, abdominal pain is not caused by a disease and will improve without treatment. It can often be observed and treated at home. Your health care provider will do a physical exam and possibly order blood tests and X-rays to help determine the seriousness of your pain. However, in many cases, more time must pass before a clear cause of the pain can be found. Before that point, your health care provider may not know if you need more testing or further treatment. HOME CARE INSTRUCTIONS Monitor your abdominal pain for any changes. The following actions may help to alleviate any discomfort you are experiencing:  Only take over-the-counter or prescription medicines as directed by your health care provider.  Do not take laxatives unless directed to do so by your health care provider.  Try a clear liquid diet (broth, tea, or water) as directed by your health care  provider. Slowly move to a bland diet as tolerated. SEEK MEDICAL CARE IF:  You have unexplained abdominal pain.  You have abdominal pain associated with nausea or diarrhea.  You have pain when you urinate or have a bowel movement.  You experience abdominal pain that wakes you in the night.  You have abdominal pain that is worsened or improved by eating food.  You have abdominal pain that is worsened with eating fatty foods.  You have a fever. SEEK IMMEDIATE MEDICAL CARE IF:  Your pain does not go away within 2 hours.  You keep throwing up (vomiting).  Your pain is felt only in portions of the abdomen, such as the right side or the left lower portion of the abdomen.  You pass bloody or black tarry stools. MAKE SURE YOU:  Understand these instructions.  Will watch your condition.  Will get help right away if you are not doing well or get worse.   This information is not intended to replace advice given to you by your health care provider. Make sure you discuss any questions you have with your health care provider.   Document Released: 03/18/2005 Document Revised: 02/27/2015 Document Reviewed: 02/15/2013 Elsevier Interactive Patient Education 2016 [employer]   Atrial Fibrillation Atrial fibrillation is a type of irregular or rapid heartbeat (arrhythmia). In atrial fibrillation, the heart quivers continuously in a chaotic pattern. This occurs when parts of the heart receive disorganized signals that make the heart unable to pump blood normally. This can  increase the risk for stroke, heart failure, and other heart-related conditions. There are different types of atrial fibrillation, including:  Paroxysmal atrial fibrillation. This type starts suddenly, and it usually stops on its own shortly after it starts.  Persistent atrial fibrillation. This type often lasts longer than a week. It may stop on its own or with treatment.  Long-lasting persistent atrial fibrillation.  This type lasts longer than 12 months.  Permanent atrial fibrillation. This type does not go away. Talk with your health care provider to learn about the type of atrial fibrillation that you have. CAUSES This condition is caused by some heart-related conditions or procedures, including:  A heart attack.  Coronary artery disease.  Heart failure.  Heart valve conditions.  High blood pressure.  Inflammation of the sac that surrounds the heart (pericarditis).  Heart surgery.  Certain heart rhythm disorders, such as Wolf-Parkinson-White syndrome. Other causes include:  Pneumonia.  Obstructive sleep apnea.  Blockage of an artery in the lungs (pulmonary embolism, or PE).  Lung cancer.  Chronic lung disease.  Thyroid problems, especially if the thyroid is overactive (hyperthyroidism).  Caffeine.  Excessive alcohol use or illegal drug use.  Use of some medicines, including certain decongestants and diet pills. Sometimes, the cause cannot be found. RISK FACTORS This condition is more likely to develop in:  People who are older in age.  People who smoke.  People who have diabetes mellitus.  People who are overweight (obese).  Athletes who exercise vigorously. SYMPTOMS Symptoms of this condition include:  A feeling that your heart is beating rapidly or irregularly.  A feeling of discomfort or pain in your chest.  Shortness of breath.  Sudden light-headedness or weakness.  Getting tired easily during exercise. In some cases, there are no symptoms. DIAGNOSIS Your health care provider may be able to detect atrial fibrillation when taking your pulse. If detected, this condition may be diagnosed with:  An electrocardiogram (ECG).  A Holter monitor test that records your heartbeat patterns over a 24-hour period.  Transthoracic echocardiogram (TTE) to evaluate how blood flows through your heart.  Transesophageal echocardiogram (TEE) to view more detailed  images of your heart.  A stress test.  Imaging tests, such as a CT scan or chest X-ray.  Blood tests. TREATMENT The main goals of treatment are to prevent blood clots from forming and to keep your heart beating at a normal rate and rhythm. The type of treatment that you receive depends on many factors, such as your underlying medical conditions and how you feel when you are experiencing atrial fibrillation. This condition may be treated with:  Medicine to slow down the heart rate, bring the heart's rhythm back to normal, or prevent clots from forming.  Electrical cardioversion. This is a procedure that resets your heart's rhythm by delivering a controlled, low-energy shock to the heart through your skin.  Different types of ablation, such as catheter ablation, catheter ablation with pacemaker, or surgical ablation. These procedures destroy the heart tissues that send abnormal signals. When the pacemaker is used, it is placed under your skin to help your heart beat in a regular rhythm. HOME CARE INSTRUCTIONS  Take over-the counter and prescription medicines only as told by your health care provider.  If your health care provider prescribed a blood-thinning medicine (anticoagulant), take it exactly as told. Taking too much blood-thinning medicine can cause bleeding. If you do not take enough blood-thinning medicine, you will not have the protection that you need against stroke and other  problems.  Do not use tobacco products, including cigarettes, chewing tobacco, and e-cigarettes. If you need help quitting, ask your health care provider.  If you have obstructive sleep apnea, manage your condition as told by your health care provider.  Do not drink alcohol.  Do not drink beverages that contain caffeine, such as coffee, soda, and tea.  Maintain a healthy weight. Do not use diet pills unless your health care provider approves. Diet pills may make heart problems worse.  Follow diet  instructions as told by your health care provider.  Exercise regularly as told by your health care provider.  Keep all follow-up visits as told by your health care provider. This is important. PREVENTION  Avoid drinking beverages that contain caffeine or alcohol.  Avoid certain medicines, especially medicines that are used for breathing problems.  Avoid certain herbs and herbal medicines, such as those that contain ephedra or ginseng.  Do not use illegal drugs, such as cocaine and amphetamines.  Do not smoke.  Manage your high blood pressure. SEEK MEDICAL CARE IF:  You notice a change in the rate, rhythm, or strength of your heartbeat.  You are taking an anticoagulant and you notice increased bruising.  You tire more easily when you exercise or exert yourself. SEEK IMMEDIATE MEDICAL CARE IF:  You have chest pain, abdominal pain, sweating, or weakness.  You feel nauseous.  You notice blood in your vomit, bowel movement, or urine.  You have shortness of breath.  You suddenly have swollen feet and ankles.  You feel dizzy.  You have sudden weakness or numbness of the face, arm, or leg, especially on one side of the body.  You have trouble speaking, trouble understanding, or both (aphasia).  Your face or your eyelid droops on one side. These symptoms may represent a serious problem that is an emergency. Do not wait to see if the symptoms will go away. Get medical help right away. Call your local emergency services (911 in the [place] Do not drive yourself to the hospital.   This information is not intended to replace advice given to you by your health care provider. Make sure you discuss any questions you have with your health care provider.   Document Released: 06/08/2005 Document Revised: 02/27/2015 Document Reviewed: 10/03/2014 Elsevier Interactive Patient Education 2016 [employer].   Hypokalemia Hypokalemia means that the amount of potassium in the blood is lower  than normal.Potassium is a chemical, called an electrolyte, that helps regulate the amount of fluid in the body. It also stimulates muscle contraction and helps nerves function properly.Most of the body's potassium is inside of cells, and only a very small amount is in the blood. Because the amount in the blood is so small, minor changes can be life-threatening. CAUSES  Antibiotics.  Diarrhea or vomiting.  Using laxatives too much, which can cause diarrhea.  Chronic kidney disease.  Water pills (diuretics).  Eating disorders (bulimia).  Low magnesium level.  Sweating a lot. SIGNS AND SYMPTOMS  Weakness.  Constipation.  Fatigue.  Muscle cramps.  Mental confusion.  Skipped heartbeats or irregular heartbeat (palpitations).  Tingling or numbness. DIAGNOSIS  Your health care provider can diagnose hypokalemia with blood tests. In addition to checking your potassium level, your health care provider may also check other lab tests. TREATMENT Hypokalemia can be treated with potassium supplements taken by mouth or adjustments in your current medicines. If your potassium level is very low, you may need to get potassium through a vein (IV) and be  monitored in the hospital. A diet high in potassium is also helpful. Foods high in potassium are:  Nuts, such as peanuts and pistachios.  Seeds, such as sunflower seeds and pumpkin seeds.  Peas, lentils, and lima beans.  Whole grain and bran cereals and breads.  Fresh fruit and vegetables, such as apricots, avocado, bananas, cantaloupe, kiwi, oranges, tomatoes, asparagus, and potatoes.  Orange and tomato juices.  Red meats.  Fruit yogurt. HOME CARE INSTRUCTIONS  Take all medicines as prescribed by your health care provider.  Maintain a healthy diet by including nutritious food, such as fruits, vegetables, nuts, whole grains, and lean meats.  If you are taking a laxative, be sure to follow the directions on the label. SEEK  MEDICAL CARE IF:  Your weakness gets worse.  You feel your heart pounding or racing.  You are vomiting or having diarrhea.  You are diabetic and having trouble keeping your blood glucose in the normal range. SEEK IMMEDIATE MEDICAL CARE IF:  You have chest pain, shortness of breath, or dizziness.  You are vomiting or having diarrhea for more than 2 days.  You faint. MAKE SURE YOU:   Understand these instructions.  Will watch your condition.  Will get help right away if you are not doing well or get worse.   This information is not intended to replace advice given to you by your health care provider. Make sure you discuss any questions you have with your health care provider.   Document Released: 06/08/2005 Document Revised: 06/29/2014 Document Reviewed: 12/09/2012 Elsevier Interactive Patient Education 2016 [employer].    Hypertension Hypertension, commonly called high blood pressure, is when the force of blood pumping through your arteries is too strong. Your arteries are the blood vessels that carry blood from your heart throughout your body. A blood pressure reading consists of a higher number over a lower number, such as 110/72. The higher number (systolic) is the pressure inside your arteries when your heart pumps. The lower number (diastolic) is the pressure inside your arteries when your heart relaxes. Ideally you want your blood pressure below 120/80. Hypertension forces your heart to work harder to pump blood. Your arteries may become narrow or stiff. Having untreated or uncontrolled hypertension can cause heart attack, stroke, kidney disease, and other problems. RISK FACTORS Some risk factors for high blood pressure are controllable. Others are not.  Risk factors you cannot control include:   Race. You may be at higher risk if you are African American.  Age. Risk increases with age.  Gender. Men are at higher risk than women before age 48 years. After age 16,  women are at higher risk than men. Risk factors you can control include:  Not getting enough exercise or physical activity.  Being overweight.  Getting too much fat, sugar, calories, or salt in your diet.  Drinking too much alcohol. SIGNS AND SYMPTOMS Hypertension does not usually cause signs or symptoms. Extremely high blood pressure (hypertensive crisis) may cause headache, anxiety, shortness of breath, and nosebleed. DIAGNOSIS To check if you have hypertension, your health care provider will measure your blood pressure while you are seated, with your arm held at the level of your heart. It should be measured at least twice using the same arm. Certain conditions can cause a difference in blood pressure between your right and left arms. A blood pressure reading that is higher than normal on one occasion does not mean that you need treatment. If it is not clear whether you  have high blood pressure, you may be asked to return on a different day to have your blood pressure checked again. Or, you may be asked to monitor your blood pressure at home for 1 or more weeks. TREATMENT Treating high blood pressure includes making lifestyle changes and possibly taking medicine. Living a healthy lifestyle can help lower high blood pressure. You may need to change some of your habits. Lifestyle changes may include:  Following the DASH diet. This diet is high in fruits, vegetables, and whole grains. It is low in salt, red meat, and added sugars.  Keep your sodium intake below 2,300 mg per day.  Getting at least 30-45 minutes of aerobic exercise at least 4 times per week.  Losing weight if necessary.  Not smoking.  Limiting alcoholic beverages.  Learning ways to reduce stress. Your health care provider may prescribe medicine if lifestyle changes are not enough to get your blood pressure under control, and if one of the following is true:  You are 66-45 years of age and your systolic blood pressure  is above 140.  You are 35 years of age or older, and your systolic blood pressure is above 150.  Your diastolic blood pressure is above 90.  You have diabetes, and your systolic blood pressure is over 140 or your diastolic blood pressure is over 90.  You have kidney disease and your blood pressure is above 140/90.  You have heart disease and your blood pressure is above 140/90. Your personal target blood pressure may vary depending on your medical conditions, your age, and other factors. HOME CARE INSTRUCTIONS  Have your blood pressure rechecked as directed by your health care provider.   Take medicines only as directed by your health care provider. Follow the directions carefully. Blood pressure medicines must be taken as prescribed. The medicine does not work as well when you skip doses. Skipping doses also puts you at risk for problems.  Do not smoke.   Monitor your blood pressure at home as directed by your health care provider. SEEK MEDICAL CARE IF:   You think you are having a reaction to medicines taken.  You have recurrent headaches or feel dizzy.  You have swelling in your ankles.  You have trouble with your vision. SEEK IMMEDIATE MEDICAL CARE IF:  You develop a severe headache or confusion.  You have unusual weakness, numbness, or feel faint.  You have severe chest or abdominal pain.  You vomit repeatedly.  You have trouble breathing. MAKE SURE YOU:   Understand these instructions.  Will watch your condition.  Will get help right away if you are not doing well or get worse.   This information is not intended to replace advice given to you by your health care provider. Make sure you discuss any questions you have with your health care provider.   Document Released: 06/08/2005 Document Revised: 10/23/2014 Document Reviewed: 03/31/2013 Elsevier Interactive Patient Education 2016 Elsevier Inc.    Heart Failure Heart failure means your heart has  trouble pumping blood. This makes it hard for your body to work well. Heart failure is usually a long-term (chronic) condition. You must take good care of yourself and follow your doctor's treatment plan. HOME CARE  Take your heart medicine as told by your doctor.  Do not stop taking medicine unless your doctor tells you to.  Do not skip any dose of medicine.  Refill your medicines before they run out.  Take other medicines only as told by your  doctor or pharmacist.  Stay active if told by your doctor. The elderly and people with severe heart failure should talk with a doctor about physical activity.  Eat heart-healthy foods. Choose foods that are without trans fat and are low in saturated fat, cholesterol, and salt (sodium). This includes fresh or frozen fruits and vegetables, fish, lean meats, fat-free or low-fat dairy foods, whole grains, and high-fiber foods. Lentils and dried peas and beans (legumes) are also good choices.  Limit salt if told by your doctor.  Cook in a healthy way. Roast, grill, broil, bake, poach, steam, or stir-fry foods.  Limit fluids as told by your doctor.  Weigh yourself every morning. Do this after you pee (urinate) and before you eat breakfast. Write down your weight to give to your doctor.  Take your blood pressure and write it down if your doctor tells you to.  Ask your doctor how to check your pulse. Check your pulse as told.  Lose weight if told by your doctor.  Stop smoking or chewing tobacco. Do not use gum or patches that help you quit without your doctor's approval.  Schedule and go to doctor visits as told.  Nonpregnant women should have no more than 1 drink a day. Men should have no more than 2 drinks a day. Talk to your doctor about drinking alcohol.  Stop illegal drug use.  Stay current with shots (immunizations).  Manage your health conditions as told by your doctor.  Learn to manage your stress.  Rest when you are tired.  If  it is really hot outside:  Avoid intense activities.  Use air conditioning or fans, or get in a cooler place.  Avoid caffeine and alcohol.  Wear loose-fitting, lightweight, and light-colored clothing.  If it is really cold outside:  Avoid intense activities.  Layer your clothing.  Wear mittens or gloves, a hat, and a scarf when going outside.  Avoid alcohol.  Learn about heart failure and get support as needed.  Get help to maintain or improve your quality of life and your ability to care for yourself as needed. GET HELP IF:   You gain weight quickly.  You are more short of breath than usual.  You cannot do your normal activities.  You tire easily.  You cough more than normal, especially with activity.  You have any or more puffiness (swelling) in areas such as your hands, feet, ankles, or belly (abdomen).  You cannot sleep because it is hard to breathe.  You feel like your heart is beating fast (palpitations).  You get dizzy or light-headed when you stand up. GET HELP RIGHT AWAY IF:   You have trouble breathing.  There is a change in mental status, such as becoming less alert or not being able to focus.  You have chest pain or discomfort.  You faint. MAKE SURE YOU:   Understand these instructions.  Will watch your condition.  Will get help right away if you are not doing well or get worse.   This information is not intended to replace advice given to you by your health care provider. Make sure you discuss any questions you have with your health care provider.   Document Released: 03/17/2008 Document Revised: 06/29/2014 Document Reviewed: 07/25/2012 Elsevier Interactive Patient Education 2016 [employer] Inc.    Heart-Healthy Eating Plan Heart-healthy meal planning includes:  Limiting unhealthy fats.  Increasing healthy fats.  Making other small dietary changes. You may need to talk with your doctor or a diet  specialist (dietitian) to create an  eating plan that is right for you. WHAT TYPES OF FAT SHOULD I CHOOSE?  Choose healthy fats. These include olive oil and canola oil, flaxseeds, walnuts, almonds, and seeds.  Eat more omega-3 fats. These include salmon, mackerel, sardines, tuna, flaxseed oil, and ground flaxseeds. Try to eat fish at least twice each week.  Limit saturated fats.  Saturated fats are often found in animal products, such as meats, butter, and cream.  Plant sources of saturated fats include palm oil, palm kernel oil, and coconut oil.  Avoid foods with partially hydrogenated oils in them. These include stick margarine, some tub margarines, cookies, crackers, and other baked goods. These contain trans fats. WHAT GENERAL GUIDELINES DO I NEED TO FOLLOW?  Check food labels carefully. Identify foods with trans fats or high amounts of saturated fat.  Fill one half of your plate with vegetables and green salads. Eat 4-5 servings of vegetables per day. A serving of vegetables is:  1 cup of raw leafy vegetables.   cup of raw or cooked cut-up vegetables.   cup of vegetable juice.  Fill one fourth of your plate with whole grains. Look for the word "whole" as the first word in the ingredient list.  Fill one fourth of your plate with lean protein foods.  Eat 4-5 servings of fruit per day. A serving of fruit is:  One medium whole fruit.   cup of dried fruit.   cup of fresh, frozen, or canned fruit.   cup of 100% fruit juice.  Eat more foods that contain soluble fiber. These include apples, broccoli, carrots, beans, peas, and barley. Try to get 20-30 g of fiber per day.  Eat more home-cooked food. Eat less restaurant, buffet, and fast food.  Limit or avoid alcohol.  Limit foods high in starch and sugar.  Avoid fried foods.  Avoid frying your food. Try baking, boiling, grilling, or broiling it instead. You can also reduce fat by:  Removing the skin from poultry.  Removing all visible fats from  meats.  Skimming the fat off of stews, soups, and gravies before serving them.  Steaming vegetables in water or broth.  Lose weight if you are overweight.  Eat 4-5 servings of nuts, legumes, and seeds per week:  One serving of dried beans or legumes equals  cup after being cooked.  One serving of nuts equals 1 ounces.  One serving of seeds equals  ounce or one tablespoon.  You may need to keep track of how much salt or sodium you eat. This is especially true if you have high blood pressure. Talk with your doctor or dietitian to get more information. WHAT FOODS CAN I EAT? Grains Breads, including [place], white, pita, wheat, raisin, rye, oatmeal, and [place]. Tortillas that are neither fried nor made with lard or trans fat. Low-fat rolls, including hotdog and hamburger buns and English muffins. Biscuits. Muffins. Waffles. Pancakes. Light popcorn. Whole-grain cereals. Flatbread. Melba toast. Pretzels. Breadsticks. Rusks. Low-fat snacks. Low-fat crackers, including oyster, saltine, matzo, graham, animal, and rye. Rice and pasta, including brown rice and pastas that are made with whole wheat.  Vegetables All vegetables.  Fruits All fruits, but limit coconut. Meats and Other Protein Sources Lean, well-trimmed beef, veal, pork, and lamb. Chicken and [place] without skin. All fish and shellfish. Wild duck, rabbit, pheasant, and venison. Egg whites or low-cholesterol egg substitutes. Dried beans, peas, lentils, and tofu. Seeds and most nuts. Dairy Low-fat or nonfat cheeses, including ricotta, string,  and mozzarella. Skim or 1% milk that is liquid, powdered, or evaporated. Buttermilk that is made with low-fat milk. Nonfat or low-fat yogurt. Beverages Mineral water. Diet carbonated beverages. Sweets and Desserts Sherbets and fruit ices. Honey, jam, marmalade, jelly, and syrups. Meringues and gelatins. Pure sugar candy, such as hard candy, jelly beans, gumdrops, mints, marshmallows, and small  amounts of dark chocolate. [employer]. Eat all sweets and desserts in moderation. Fats and Oils Nonhydrogenated (trans-free) margarines. Vegetable oils, including soybean, sesame, sunflower, olive, peanut, safflower, corn, canola, and cottonseed. Salad dressings or mayonnaise made with a vegetable oil. Limit added fats and oils that you use for cooking, baking, salads, and as spreads. Other Cocoa powder. Coffee and tea. All seasonings and condiments. The items listed above may not be a complete list of recommended foods or beverages. Contact your dietitian for more options. WHAT FOODS ARE NOT RECOMMENDED? Grains Breads that are made with saturated or trans fats, oils, or whole milk. Croissants. Butter rolls. Cheese breads. Sweet rolls. Donuts. Buttered popcorn. Chow mein noodles. High-fat crackers, such as cheese or butter crackers. Meats and Other Protein Sources Fatty meats, such as hotdogs, short ribs, sausage, spareribs, bacon, rib eye roast or steak, and mutton. High-fat deli meats, such as salami and bologna. Caviar. Domestic duck and goose. Organ meats, such as kidney, liver, sweetbreads, and heart. Dairy Cream, sour cream, cream cheese, and creamed cottage cheese. Whole-milk cheeses, including blue (bleu), [address], [place], [place], [address], [place], [address], cheddar, [place], and [place]. Whole or 2% milk that is liquid, evaporated, or condensed. Whole buttermilk. Cream sauce or high-fat cheese sauce. Yogurt that is made from whole milk. Beverages Regular sodas and juice drinks with added sugar. Sweets and Desserts Frosting. Pudding. Cookies. Cakes other than angel food cake. Candy that has milk chocolate or white chocolate, hydrogenated fat, butter, coconut, or unknown ingredients. Buttered syrups. Full-fat ice cream or ice cream drinks. Fats and Oils Gravy that has suet, meat fat, or shortening. Cocoa butter, hydrogenated oils, palm oil, coconut oil, palm kernel oil. These can  often be found in baked products, candy, fried foods, nondairy creamers, and whipped toppings. Solid fats and shortenings, including bacon fat, salt pork, lard, and butter. Nondairy cream substitutes, such as coffee creamers and sour cream substitutes. Salad dressings that are made of unknown oils, cheese, or sour cream. The items listed above may not be a complete list of foods and beverages to avoid. Contact your dietitian for more information.   This information is not intended to replace advice given to you by your health care provider. Make sure you discuss any questions you have with your health care provider.   Document Released: 12/08/2011 Document Revised: 06/29/2014 Document Reviewed: 11/30/2013 Elsevier Interactive Patient Education [employer]

## 2015-05-13 NOTE — ED Provider Notes (Addendum)
CSN: [id number]     Arrival date & time 05/13/15  0750 History  By signing my name below, I, Jarvis Morgan, attest that this documentation has been prepared under the direction and in the presence of Cathren Laine, MD. Electronically Signed: Jarvis Morgan, ED Scribe. 05/13/2015. 10:52 AM.    Chief Complaint  Patient presents with  . Multiple Complaints    The history is provided by the patient. No language interpreter was used.    HPI Comments: [name] is a 49 y.o. male with a h/o GERD, HTN, CHF who presents to the Emergency Department complaining of constant, non radiating, periumbilical abdominal pain onset 2 days. Pt indicates he think eating popcorn caused his symptoms.  He reports associated nausea, urinary frequency, polydipsia, generalized weakness, mild chest pain, SOB and dizziness also for 2 days.  Symptoms at rest. No exertional cp or discomfort. No associated diaphoresis or nv. . Pt states he believes he is having heartburn symptoms from something that he ate. He admits he has not been compliant with his daily medications over the past few days due to not feeling well. He denies any h/o DM diagnosis. Pt denies any h/o abdominal surgeries, gallbladder issues or diverticulitis. Pt is a former smoker with 0.5 ppd history for 20 years. He denies any testicular pain, scrotal pain, vomiting, diarrhea, fevers, chills, or leg swelling.  No leg pain. Mild bil ankle edema, no new or unilateral leg pain or swelling.  Denies hx cad, dvt or pe.  No change in speech or vision. No extremity numbness/weakness, or loss of normal functional ability.      Past Medical History  Diagnosis Date  . Hypertensive heart disease     Hypertensive heart disease with prior episodes of CHF  . Hyperlipidemia   . Hypertension   . Gastroesophageal reflux disease   . Osteoarthritis   . Peptic ulcer disease   . Morbid obesity (HCC)     /sleep apnea  . Allergic rhinitis     plus h/o asthma  . Tobacco  abuse, in remission     Cigarettes discontinued in 2010  . CHF (congestive heart failure) (HCC)   . A-fib (HCC)     2015   Past Surgical History  Procedure Laterality Date  . None     Family History  Problem Relation Age of Onset  . Colon cancer Neg Hx   . Inflammatory bowel disease Neg Hx   . Liver disease Neg Hx    Social History  Substance Use Topics  . Smoking status: Former Smoker -- 0.50 packs/day for 20 years    Types: Cigarettes    Start date: 04/26/1988    Quit date: 06/23/2007  . Smokeless tobacco: Never Used     Comment: 1 ppd former smoker  . Alcohol Use: No     Comment: no etoh since 2009    Review of Systems  Constitutional: Negative for fever and chills.  HENT: Negative for sore throat and trouble swallowing.   Eyes: Negative for redness.  Respiratory: Negative for shortness of breath.   Cardiovascular: Negative for chest pain and palpitations.  Gastrointestinal: Positive for abdominal pain. Negative for vomiting and diarrhea.  Endocrine: Positive for polyuria.  Genitourinary: Negative for dysuria and flank pain.  Musculoskeletal: Negative for back pain and neck pain.  Skin: Negative for rash.  Neurological: Negative for weakness, numbness and headaches.  Hematological: Does not bruise/bleed easily.  Psychiatric/Behavioral: Negative for confusion.  All other systems reviewed and are  negative.     Allergies  Bee venom; Aspirin; and Banana  Home Medications   Prior to Admission medications   Medication Sig Start Date End Date Taking? Authorizing Provider  beclomethasone (QVAR) 80 MCG/ACT inhaler Inhale 2 puffs into the lungs 2 (two) times daily.   Yes Historical Provider, MD  cloNIDine (CATAPRES) 0.2 MG tablet Take 1 tablet (0.2 mg total) by mouth 2 (two) times daily. 05/01/15  Yes [name], MD  diltiazem (CARDIZEM CD) 240 MG 24 hr capsule Taking one tab, alternating with 2 the next day 04/18/15  Yes [name], MD  furosemide  (LASIX) 20 MG tablet Take 20 mg by mouth daily.   Yes Historical Provider, MD  metoprolol succinate (TOPROL-XL) 50 MG 24 hr tablet Take 50 mg by mouth every other day. Take with or immediately following a meal.   Yes Historical Provider, MD  rivaroxaban (XARELTO) 20 MG TABS tablet Take 1 tablet (20 mg total) by mouth daily with supper. 04/02/15  Yes Erick Blinks, MD  guaiFENesin (MUCINEX) 600 MG 12 hr tablet Take 2 tablets (1,200 mg total) by mouth 2 (two) times daily. Patient not taking: Reported on 05/13/2015 04/02/15   Erick Blinks, MD  levofloxacin (LEVAQUIN) 750 MG tablet Take 1 tablet (750 mg total) by mouth daily. Patient not taking: Reported on 05/13/2015 04/02/15   Erick Blinks, MD  omeprazole (PRILOSEC) 20 MG capsule Take 1 capsule (20 mg total) by mouth daily. Patient not taking: Reported on 05/13/2015 04/02/15   Erick Blinks, MD   Triage Vitals: BP 157/111 mmHg  Pulse 78  Temp(Src) 99 F (37.2 C) (Oral)  Resp 22  Ht 6' (1.829 m)  Wt 330 lb (149.687 kg)  BMI 44.75 kg/m2  SpO2 93%  Physical Exam  Constitutional: He is oriented to person, place, and time. He appears well-developed and well-nourished. No distress.  HENT:  Head: Normocephalic and atraumatic.  Nose: Nose normal.  Mouth/Throat: Oropharynx is clear and moist.  Eyes: Conjunctivae and EOM are normal. Pupils are equal, round, and reactive to light. No scleral icterus.  Neck: Normal range of motion. Neck supple. No tracheal deviation present. No thyromegaly present.  No bruit  Cardiovascular: Normal rate, regular rhythm, normal heart sounds and intact distal pulses.  Exam reveals no gallop and no friction rub.   No murmur heard. Pulmonary/Chest: Effort normal and breath sounds normal. No respiratory distress.  Abdominal: Soft. Bowel sounds are normal. He exhibits no distension and no mass. There is no tenderness. There is no rebound and no guarding.  No abd bruits.   Genitourinary:  No cva tenderness   Musculoskeletal: Normal range of motion. He exhibits edema.  Bilateral ankle edema, mild, symmetric. No lower leg or calf pain or tenderness.   Neurological: He is alert and oriented to person, place, and time. No cranial nerve deficit.  Motor intact bil. stre 5/5 sens grossly intact. Steady gait.   Skin: Skin is warm and dry. No rash noted. He is not diaphoretic.  Psychiatric: He has a normal mood and affect. His behavior is normal.  Nursing note and vitals reviewed.   ED Course  Procedures (including critical care time)  DIAGNOSTIC STUDIES: Oxygen Saturation is 93% on RA, normal by my interpretation.    COORDINATION OF CARE: 8:24 AM- Will order 12-lead EKG.  Pt advised of plan for treatment and pt agrees.    Results for orders placed or performed during the hospital encounter of 05/13/15  CBC  Result Value Ref  Range   WBC 5.0 4.0 - 10.5 K/uL   RBC 4.26 4.22 - 5.81 MIL/uL   Hemoglobin 12.3 (L) 13.0 - 17.0 g/dL   HCT 81.1 (L) 91.4 - 78.2 %   MCV 86.4 78.0 - 100.0 fL   MCH 28.9 26.0 - 34.0 pg   MCHC 33.4 30.0 - 36.0 g/dL   RDW 95.6 21.3 - 08.6 %   Platelets 217 150 - 400 K/uL  Comprehensive metabolic panel  Result Value Ref Range   Sodium 141 135 - 145 mmol/L   Potassium 3.1 (L) 3.5 - 5.1 mmol/L   Chloride 106 101 - 111 mmol/L   CO2 24 22 - 32 mmol/L   Glucose, Bld 90 65 - 99 mg/dL   BUN 16 6 - 20 mg/dL   Creatinine, Ser 5.78 0.61 - 1.24 mg/dL   Calcium 8.6 (L) 8.9 - 10.3 mg/dL   Total Protein 6.1 (L) 6.5 - 8.1 g/dL   Albumin 3.5 3.5 - 5.0 g/dL   AST 28 15 - 41 U/L   ALT 25 17 - 63 U/L   Alkaline Phosphatase 46 38 - 126 U/L   Total Bilirubin 2.6 (H) 0.3 - 1.2 mg/dL   GFR calc non Af Amer >60 >60 mL/min   GFR calc Af Amer >60 >60 mL/min   Anion gap 11 5 - 15  Troponin I  Result Value Ref Range   Troponin I 0.05 (H) <0.031 ng/mL  Brain natriuretic peptide  Result Value Ref Range   B Natriuretic Peptide 626.0 (H) 0.0 - 100.0 pg/mL  Lipase, blood  Result Value  Ref Range   Lipase 23 11 - 51 U/L  Urinalysis, Routine w reflex microscopic (not at [facility])  Result Value Ref Range   Color, Urine ORANGE (A) YELLOW   APPearance CLEAR CLEAR   Specific Gravity, Urine >1.030 (H) 1.005 - 1.030   pH 6.0 5.0 - 8.0   Glucose, UA NEGATIVE NEGATIVE mg/dL   Hgb urine dipstick NEGATIVE NEGATIVE   Bilirubin Urine SMALL (A) NEGATIVE   Ketones, ur NEGATIVE NEGATIVE mg/dL   Protein, ur 469 (A) NEGATIVE mg/dL   Nitrite NEGATIVE NEGATIVE   Leukocytes, UA NEGATIVE NEGATIVE  Troponin I  Result Value Ref Range   Troponin I 0.04 (H) <0.031 ng/mL  Urine microscopic-add on  Result Value Ref Range   Squamous Epithelial / LPF NONE SEEN NONE SEEN   WBC, UA NONE SEEN 0 - 5 WBC/hpf   RBC / HPF 0-5 0 - 5 RBC/hpf   Bacteria, UA NONE SEEN NONE SEEN   Dg Chest 2 View  05/13/2015  CLINICAL DATA:  Cough, shortness of Breath EXAM: CHEST  2 VIEW COMPARISON:  04/01/2015 FINDINGS: Cardiomediastinal silhouette is stable. Central mild vascular congestion without convincing pulmonary edema. Mild infrahilar bronchitic changes. IMPRESSION: Central vascular congestion without convincing pulmonary edema. Mild infrahilar bronchitic changes. Electronically Signed   By: Natasha Mead M.D.   On: 05/13/2015 09:09      I have personally reviewed and evaluated these images and lab results as part of my medical decision-making.   EKG Interpretation   Date/Time:  Monday May 13 2015 08:08:36 EST Ventricular Rate:  78 PR Interval:    QRS Duration: 95 QT Interval:  412 QTC Calculation: 469 R Axis:   53 Text Interpretation:  Atrial fibrillation Borderline repolarization  abnormality Non-specific ST-t changes Confirmed by Denton Lank  MD, Caryn Bee  ([id number]) on 05/13/2015 8:21:03 AM      MDM   I personally performed  the services described in this documentation, which was scribed in my presence. The recorded information has been reviewed and considered. Cathren Laine, MD  Reviewed nursing notes  and prior charts for additional history.   Labs sent.  Recheck, pt indicates abd pain resolved. No chest pain or discomfort of any sort. Pt indicates feels breathing at baseline.   bp remains high. No headache. No visual change. No numbness/weakness.  Pt indicates hasnt had meds today  - will give dose of his meds.   Delta troponin .04, c/w prior, and not increasing compared to initial value.   k low, kcl po. Po fluids.   Recheck pt again - pt indicates feels at baseline, denies pain. Denies sob, states feels breathing like normal. No chest pain.   On review prior notes, hx non compliance w meds. Discussed meds w pt and stressed importance of taking as prescribed, specifically his blood pressure medications and blood thinner medication, given his signif hx htn, chf, afib - discussed risk of stroke and heart disease/failure with patient - pt voices understanding.   Discussed w pt whether he had adequate of his meds at home - pt indicates he does have adequate of his meds at home.   Recheck bp 140/100, pulse ox 98% room air.  Pt denies pain, sob or other c/o.  Pt currently appears stable for d/c. Discussed rec of close pcp f/u in next 1-2 days.   Return precautions provided.        Cathren Laine, MD 05/13/15 1452

## 2015-05-15 ENCOUNTER — Emergency Department (HOSPITAL_COMMUNITY)
Admission: EM | Admit: 2015-05-15 | Discharge: 2015-05-15 | Disposition: A | Discharge status: Home / Self Care | Payer: Medicare Other | Attending: Emergency Medicine | Admitting: Emergency Medicine

## 2015-05-15 ENCOUNTER — Encounter (HOSPITAL_COMMUNITY): Payer: Self-pay | Admitting: Emergency Medicine

## 2015-05-15 ENCOUNTER — Emergency Department (HOSPITAL_COMMUNITY): Payer: Medicare Other

## 2015-05-15 DIAGNOSIS — Z7901 Long term (current) use of anticoagulants: Secondary | ICD-10-CM | POA: Diagnosis not present

## 2015-05-15 DIAGNOSIS — I110 Hypertensive heart disease with heart failure: Secondary | ICD-10-CM | POA: Insufficient documentation

## 2015-05-15 DIAGNOSIS — M1990 Unspecified osteoarthritis, unspecified site: Secondary | ICD-10-CM | POA: Diagnosis not present

## 2015-05-15 DIAGNOSIS — Z8711 Personal history of peptic ulcer disease: Secondary | ICD-10-CM | POA: Diagnosis not present

## 2015-05-15 DIAGNOSIS — Z87891 Personal history of nicotine dependence: Secondary | ICD-10-CM | POA: Diagnosis not present

## 2015-05-15 DIAGNOSIS — I509 Heart failure, unspecified: Secondary | ICD-10-CM | POA: Diagnosis not present

## 2015-05-15 DIAGNOSIS — I4891 Unspecified atrial fibrillation: Secondary | ICD-10-CM | POA: Insufficient documentation

## 2015-05-15 DIAGNOSIS — J45901 Unspecified asthma with (acute) exacerbation: Secondary | ICD-10-CM | POA: Diagnosis not present

## 2015-05-15 DIAGNOSIS — Z7951 Long term (current) use of inhaled steroids: Secondary | ICD-10-CM | POA: Diagnosis not present

## 2015-05-15 DIAGNOSIS — R0602 Shortness of breath: Secondary | ICD-10-CM | POA: Diagnosis present

## 2015-05-15 DIAGNOSIS — K219 Gastro-esophageal reflux disease without esophagitis: Secondary | ICD-10-CM | POA: Diagnosis not present

## 2015-05-15 DIAGNOSIS — Z79899 Other long term (current) drug therapy: Secondary | ICD-10-CM | POA: Insufficient documentation

## 2015-05-15 HISTORY — DX: Unspecified asthma, uncomplicated: J45.909

## 2015-05-15 LAB — CBC WITH DIFFERENTIAL/PLATELET
BASOS ABS: 0 10*3/uL (ref 0.0–0.1)
BASOS PCT: 0 %
EOS ABS: 0.1 10*3/uL (ref 0.0–0.7)
Eosinophils Relative: 2 %
HEMATOCRIT: 38.7 % — AB (ref 39.0–52.0)
HEMOGLOBIN: 13.3 g/dL (ref 13.0–17.0)
Lymphocytes Relative: 23 %
Lymphs Abs: 0.8 10*3/uL (ref 0.7–4.0)
MCH: 29.5 pg (ref 26.0–34.0)
MCHC: 34.4 g/dL (ref 30.0–36.0)
MCV: 85.8 fL (ref 78.0–100.0)
MONOS PCT: 8 %
Monocytes Absolute: 0.3 10*3/uL (ref 0.1–1.0)
NEUTROS ABS: 2.5 10*3/uL (ref 1.7–7.7)
NEUTROS PCT: 67 %
PLATELETS: 234 10*3/uL (ref 150–400)
RBC: 4.51 MIL/uL (ref 4.22–5.81)
RDW: 13.3 % (ref 11.5–15.5)
WBC: 3.7 10*3/uL — AB (ref 4.0–10.5)

## 2015-05-15 LAB — COMPREHENSIVE METABOLIC PANEL
ALBUMIN: 3.6 g/dL (ref 3.5–5.0)
ALK PHOS: 57 U/L (ref 38–126)
ALT: 28 U/L (ref 17–63)
ANION GAP: 8 (ref 5–15)
AST: 31 U/L (ref 15–41)
BILIRUBIN TOTAL: 1.7 mg/dL — AB (ref 0.3–1.2)
BUN: 18 mg/dL (ref 6–20)
CALCIUM: 8.7 mg/dL — AB (ref 8.9–10.3)
CO2: 25 mmol/L (ref 22–32)
Chloride: 108 mmol/L (ref 101–111)
Creatinine, Ser: 0.94 mg/dL (ref 0.61–1.24)
GFR calc non Af Amer: >60 mL/min (ref >60)
GLUCOSE: 112 mg/dL — AB (ref 65–99)
POTASSIUM: 3.4 mmol/L — AB (ref 3.5–5.1)
Sodium: 141 mmol/L (ref 135–145)
TOTAL PROTEIN: 6.4 g/dL — AB (ref 6.5–8.1)

## 2015-05-15 LAB — BRAIN NATRIURETIC PEPTIDE: B Natriuretic Peptide: 579 pg/mL — ABNORMAL HIGH (ref 0.0–100.0)

## 2015-05-15 LAB — TROPONIN I: TROPONIN I: 0.03 ng/mL (ref <0.031)

## 2015-05-15 MED ORDER — FUROSEMIDE 10 MG/ML IJ SOLN
40.0000 mg | Freq: Once | INTRAMUSCULAR | Status: AC
  Administered 2015-05-15: 40 mg via INTRAVENOUS
  Filled 2015-05-15: qty 4

## 2015-05-15 NOTE — ED Provider Notes (Signed)
CSN: [id number]     Arrival date & time 05/15/15  1929 History   First MD Initiated Contact with Patient 05/15/15 1940     Chief Complaint  Patient presents with  . Shortness of Breath     (Consider location/radiation/quality/duration/timing/severity/associated sxs/prior Treatment) HPI Comments: Patient is a 49 year old male with history of atrial fibrillation, congestive heart failure, hypertension. He presents for evaluation of shortness of breath. This started 2 days ago and has progressively worsened. His symptoms are worse with exertion and lying flat. He denies any fevers or chills. He denies any chest pain. He denies any productive cough.  Patient is a 49 y.o. male presenting with shortness of breath. The history is provided by the patient.  Shortness of Breath Severity:  Moderate Onset quality:  Sudden Duration:  2 days Timing:  Constant Progression:  Worsening Chronicity:  New Relieved by:  Nothing Worsened by:  Activity, movement and exertion (Lying flat) Ineffective treatments:  None tried Associated symptoms: no chest pain, no cough and no fever     Past Medical History  Diagnosis Date  . Hypertensive heart disease     Hypertensive heart disease with prior episodes of CHF  . Hyperlipidemia   . Hypertension   . Gastroesophageal reflux disease   . Osteoarthritis   . Peptic ulcer disease   . Morbid obesity (HCC)     /sleep apnea  . Allergic rhinitis     plus h/o asthma  . Tobacco abuse, in remission     Cigarettes discontinued in 2010  . CHF (congestive heart failure) (HCC)   . A-fib (HCC)     2015  . Asthma    Past Surgical History  Procedure Laterality Date  . None     Family History  Problem Relation Age of Onset  . Colon cancer Neg Hx   . Inflammatory bowel disease Neg Hx   . Liver disease Neg Hx    Social History  Substance Use Topics  . Smoking status: Former Smoker -- 0.50 packs/day for 20 years    Types: Cigarettes    Start date:  04/26/1988    Quit date: 06/23/2007  . Smokeless tobacco: Never Used     Comment: 1 ppd former smoker  . Alcohol Use: No     Comment: no etoh since 2009    Review of Systems  Constitutional: Negative for fever.  Respiratory: Positive for shortness of breath. Negative for cough.   Cardiovascular: Negative for chest pain.  All other systems reviewed and are negative.     Allergies  Bee venom; Aspirin; and Banana  Home Medications   Prior to Admission medications   Medication Sig Start Date End Date Taking? Authorizing Provider  beclomethasone (QVAR) 80 MCG/ACT inhaler Inhale 2 puffs into the lungs 2 (two) times daily.    Historical Provider, MD  cloNIDine (CATAPRES) 0.2 MG tablet Take 1 tablet (0.2 mg total) by mouth 2 (two) times daily. 05/01/15   [name], MD  diltiazem (CARDIZEM CD) 240 MG 24 hr capsule Taking one tab, alternating with 2 the next day 04/18/15   [name], MD  furosemide (LASIX) 20 MG tablet Take 20 mg by mouth daily.    Historical Provider, MD  guaiFENesin (MUCINEX) 600 MG 12 hr tablet Take 2 tablets (1,200 mg total) by mouth 2 (two) times daily. Patient not taking: Reported on 05/13/2015 04/02/15   Erick Blinks, MD  levofloxacin (LEVAQUIN) 750 MG tablet Take 1 tablet (750 mg total) by mouth  daily. Patient not taking: Reported on 05/13/2015 04/02/15   Erick Blinks, MD  metoprolol succinate (TOPROL-XL) 50 MG 24 hr tablet Take 50 mg by mouth every other day. Take with or immediately following a meal.    Historical Provider, MD  omeprazole (PRILOSEC) 20 MG capsule Take 1 capsule (20 mg total) by mouth daily. Patient not taking: Reported on 05/13/2015 04/02/15   Erick Blinks, MD  potassium chloride SA (K-DUR,KLOR-CON) 20 MEQ tablet Take 1 tablet (20 mEq total) by mouth daily. 05/13/15   Cathren Laine, MD  rivaroxaban (XARELTO) 20 MG TABS tablet Take 1 tablet (20 mg total) by mouth daily with supper. 04/02/15   Erick Blinks, MD   BP  191/117 mmHg  Pulse 64  Temp(Src) 97.8 F (36.6 C) (Oral)  Resp 24  Ht 6' (1.829 m)  Wt 335 lb (151.955 kg)  BMI 45.42 kg/m2  SpO2 99% Physical Exam  Constitutional: He is oriented to person, place, and time. He appears well-developed and well-nourished. No distress.  HENT:  Head: Normocephalic and atraumatic.  Neck: Normal range of motion. Neck supple.  Cardiovascular:  No murmur heard. The heart is irregularly irregular and rapid.  Pulmonary/Chest: Effort normal. No respiratory distress. He has no wheezes. He has rales.  Abdominal: Soft. Bowel sounds are normal. He exhibits no distension. There is no tenderness.  Musculoskeletal: Normal range of motion. He exhibits edema.  There is 2-3+ pitting edema of the bilateral lower extremities.  Neurological: He is alert and oriented to person, place, and time.  Skin: Skin is warm and dry. He is not diaphoretic.  Nursing note and vitals reviewed.   ED Course  Procedures (including critical care time) Labs Review Labs Reviewed  COMPREHENSIVE METABOLIC PANEL  TROPONIN I  CBC WITH DIFFERENTIAL/PLATELET  BRAIN NATRIURETIC PEPTIDE    Imaging Review No results found. I have personally reviewed and evaluated these images and lab results as part of my medical decision-making.   EKG Interpretation   Date/Time:  Wednesday May 15 2015 19:49:10 EST Ventricular Rate:  99 PR Interval:    QRS Duration: 91 QT Interval:  357 QTC Calculation: 458 R Axis:   59 Text Interpretation:  Atrial fibrillation Ventricular premature complex  Borderline repolarization abnormality Confirmed by Nylan Nevel  MD, Destiny Hagin  ([id number]) on 05/15/2015 7:56:44 PM      MDM   Final diagnoses:  None    Patient is a 49 year old male with history of CHF, chronic A. fib. He presents for shortness of breath over the past several days. Workup today reveals an elevated BNP and signs of vascular congestion on his chest x-ray. I suspect his symptoms are related to  fluid retention. He was given IV Lasix in the ER and will be discharged with an increased dose of Lasix for the next several days.  His EKG is unchanged and troponin is negative. He is not reporting any chest discomfort and I highly doubt an acute cardiac event.    Geoffery Lyons, MD 05/15/15 2116

## 2015-05-15 NOTE — ED Notes (Signed)
EDP aware of pt's VS. Discharge plan discussed.

## 2015-05-15 NOTE — ED Notes (Signed)
Pt states that he was discharge from the hospital 2 days ago for stomach problems and has been having shortness of breath since

## 2015-05-15 NOTE — ED Notes (Signed)
MD at bedside. 

## 2015-05-15 NOTE — Discharge Instructions (Signed)
Increase your dose of Lasix to 20 mg twice daily for the next week, then returned to normal.  Return to the emergency department if you develop worsening breathing, chest pain, fever, or other new and concerning symptoms.   Heart Failure Heart failure is a condition in which the heart has trouble pumping blood. This means your heart does not pump blood efficiently for your body to work well. In some cases of heart failure, fluid may back up into your lungs or you may have swelling (edema) in your lower legs. Heart failure is usually a long-term (chronic) condition. It is important for you to take good care of yourself and follow your health care provider's treatment plan. CAUSES  Some health conditions can cause heart failure. Those health conditions include:  High blood pressure (hypertension). Hypertension causes the heart muscle to work harder than normal. When pressure in the blood vessels is high, the heart needs to pump (contract) with more force in order to circulate blood throughout the body. High blood pressure eventually causes the heart to become stiff and weak.  Coronary artery disease (CAD). CAD is the buildup of cholesterol and fat (plaque) in the arteries of the heart. The blockage in the arteries deprives the heart muscle of oxygen and blood. This can cause chest pain and may lead to a heart attack. High blood pressure can also contribute to CAD.  Heart attack (myocardial infarction). A heart attack occurs when one or more arteries in the heart become blocked. The loss of oxygen damages the muscle tissue of the heart. When this happens, part of the heart muscle dies. The injured tissue does not contract as well and weakens the heart's ability to pump blood.  Abnormal heart valves. When the heart valves do not open and close properly, it can cause heart failure. This makes the heart muscle pump harder to keep the blood flowing.  Heart muscle disease (cardiomyopathy or myocarditis).  Heart muscle disease is damage to the heart muscle from a variety of causes. These can include drug or alcohol abuse, infections, or unknown reasons. These can increase the risk of heart failure.  Lung disease. Lung disease makes the heart work harder because the lungs do not work properly. This can cause a strain on the heart, leading it to fail.  Diabetes. Diabetes increases the risk of heart failure. High blood sugar contributes to high fat (lipid) levels in the blood. Diabetes can also cause slow damage to tiny blood vessels that carry important nutrients to the heart muscle. When the heart does not get enough oxygen and food, it can cause the heart to become weak and stiff. This leads to a heart that does not contract efficiently.  Other conditions can contribute to heart failure. These include abnormal heart rhythms, thyroid problems, and low blood counts (anemia). Certain unhealthy behaviors can increase the risk of heart failure, including:  Being overweight.  Smoking or chewing tobacco.  Eating foods high in fat and cholesterol.  Abusing illicit drugs or alcohol.  Lacking physical activity. SYMPTOMS  Heart failure symptoms may vary and can be hard to detect. Symptoms may include:  Shortness of breath with activity, such as climbing stairs.  Persistent cough.  Swelling of the feet, ankles, legs, or abdomen.  Unexplained weight gain.  Difficulty breathing when lying flat (orthopnea).  Waking from sleep because of the need to sit up and get more air.  Rapid heartbeat.  Fatigue and loss of energy.  Feeling light-headed, dizzy, or close to  fainting.  Loss of appetite.  Nausea.  Increased urination during the night (nocturia). DIAGNOSIS  A diagnosis of heart failure is based on your history, symptoms, physical examination, and diagnostic tests. Diagnostic tests for heart failure may include:  Echocardiography.  Electrocardiography.  Chest X-ray.  Blood  tests.  Exercise stress test.  Cardiac angiography.  Radionuclide scans. TREATMENT  Treatment is aimed at managing the symptoms of heart failure. Medicines, behavioral changes, or surgical intervention may be necessary to treat heart failure.  Medicines to help treat heart failure may include:  Angiotensin-converting enzyme (ACE) inhibitors. This type of medicine blocks the effects of a blood protein called angiotensin-converting enzyme. ACE inhibitors relax (dilate) the blood vessels and help lower blood pressure.  Angiotensin receptor blockers (ARBs). This type of medicine blocks the actions of a blood protein called angiotensin. Angiotensin receptor blockers dilate the blood vessels and help lower blood pressure.  Water pills (diuretics). Diuretics cause the kidneys to remove salt and water from the blood. The extra fluid is removed through urination. This loss of extra fluid lowers the volume of blood the heart pumps.  Beta blockers. These prevent the heart from beating too fast and improve heart muscle strength.  Digitalis. This increases the force of the heartbeat.  Healthy behavior changes include:  Obtaining and maintaining a healthy weight.  Stopping smoking or chewing tobacco.  Eating heart-healthy foods.  Limiting or avoiding alcohol.  Stopping illicit drug use.  Physical activity as directed by your health care provider.  Surgical treatment for heart failure may include:  A procedure to open blocked arteries, repair damaged heart valves, or remove damaged heart muscle tissue.  A pacemaker to improve heart muscle function and control certain abnormal heart rhythms.  An internal cardioverter defibrillator to treat certain serious abnormal heart rhythms.  A left ventricular assist device (LVAD) to assist the pumping ability of the heart. HOME CARE INSTRUCTIONS   Take medicines only as directed by your health care provider. Medicines are important in reducing  the workload of your heart, slowing the progression of heart failure, and improving your symptoms.  Do not stop taking your medicine unless directed by your health care provider.  Do not skip any dose of medicine.  Refill your prescriptions before you run out of medicine. Your medicines are needed every day.  Engage in moderate physical activity if directed by your health care provider. Moderate physical activity can benefit some people. The elderly and people with severe heart failure should consult with a health care provider for physical activity recommendations.  Eat heart-healthy foods. Food choices should be free of trans fat and low in saturated fat, cholesterol, and salt (sodium). Healthy choices include fresh or frozen fruits and vegetables, fish, lean meats, legumes, fat-free or low-fat dairy products, and whole grain or high fiber foods. Talk to a dietitian to learn more about heart-healthy foods.  Limit sodium if directed by your health care provider. Sodium restriction may reduce symptoms of heart failure in some people. Talk to a dietitian to learn more about heart-healthy seasonings.  Use healthy cooking methods. Healthy cooking methods include roasting, grilling, broiling, baking, poaching, steaming, or stir-frying. Talk to a dietitian to learn more about healthy cooking methods.  Limit fluids if directed by your health care provider. Fluid restriction may reduce symptoms of heart failure in some people.  Weigh yourself every day. Daily weights are important in the early recognition of excess fluid. You should weigh yourself every morning after you urinate and before  you eat breakfast. Wear the same amount of clothing each time you weigh yourself. Record your daily weight. Provide your health care provider with your weight record.  Monitor and record your blood pressure if directed by your health care provider.  Check your pulse if directed by your health care provider.  Lose  weight if directed by your health care provider. Weight loss may reduce symptoms of heart failure in some people.  Stop smoking or chewing tobacco. Nicotine makes your heart work harder by causing your blood vessels to constrict. Do not use nicotine gum or patches before talking to your health care provider.  Keep all follow-up visits as directed by your health care provider. This is important.  Limit alcohol intake to no more than 1 drink per day for nonpregnant women and 2 drinks per day for men. One drink equals 12 ounces of beer, 5 ounces of wine, or 1 ounces of hard liquor. Drinking more than that is harmful to your heart. Tell your health care provider if you drink alcohol several times a week. Talk with your health care provider about whether alcohol is safe for you. If your heart has already been damaged by alcohol or you have severe heart failure, drinking alcohol should be stopped completely.  Stop illicit drug use.  Stay up-to-date with immunizations. It is especially important to prevent respiratory infections through current pneumococcal and influenza immunizations.  Manage other health conditions such as hypertension, diabetes, thyroid disease, or abnormal heart rhythms as directed by your health care provider.  Learn to manage stress.  Plan rest periods when fatigued.  Learn strategies to manage high temperatures. If the weather is extremely hot:  Avoid vigorous physical activity.  Use air conditioning or fans or seek a cooler location.  Avoid caffeine and alcohol.  Wear loose-fitting, lightweight, and light-colored clothing.  Learn strategies to manage cold temperatures. If the weather is extremely cold:  Avoid vigorous physical activity.  Layer clothes.  Wear mittens or gloves, a hat, and a scarf when going outside.  Avoid alcohol.  Obtain ongoing education and support as needed.  Participate in or seek rehabilitation as needed to maintain or improve  independence and quality of life. SEEK MEDICAL CARE IF:   You have a rapid weight gain.  You have increasing shortness of breath that is unusual for you.  You are unable to participate in your usual physical activities.  You tire easily.  You cough more than normal, especially with physical activity.  You have any or more swelling in areas such as your hands, feet, ankles, or abdomen.  You are unable to sleep because it is hard to breathe.  You feel like your heart is beating fast (palpitations).  You become dizzy or light-headed upon standing up. SEEK IMMEDIATE MEDICAL CARE IF:   You have difficulty breathing.  There is a change in mental status such as decreased alertness or difficulty with concentration.  You have a pain or discomfort in your chest.  You have an episode of fainting (syncope). MAKE SURE YOU:   Understand these instructions.  Will watch your condition.  Will get help right away if you are not doing well or get worse.   This information is not intended to replace advice given to you by your health care provider. Make sure you discuss any questions you have with your health care provider.   Document Released: 06/08/2005 Document Revised: 10/23/2014 Document Reviewed: 07/08/2012 Elsevier Interactive Patient Education [employer]

## 2015-05-24 ENCOUNTER — Other Ambulatory Visit: Payer: Self-pay | Admitting: Cardiovascular Disease

## 2015-05-28 ENCOUNTER — Observation Stay (HOSPITAL_COMMUNITY): Payer: Medicare Other

## 2015-05-28 ENCOUNTER — Emergency Department (HOSPITAL_COMMUNITY): Payer: Medicare Other

## 2015-05-28 ENCOUNTER — Inpatient Hospital Stay (HOSPITAL_COMMUNITY)
Admission: EM | Admit: 2015-05-28 | Discharge: 2015-05-29 | DRG: 291 | Disposition: A | Discharge status: Home / Self Care | Payer: Medicare Other | Attending: Internal Medicine | Admitting: Internal Medicine

## 2015-05-28 ENCOUNTER — Encounter (HOSPITAL_COMMUNITY): Payer: Self-pay | Admitting: *Deleted

## 2015-05-28 DIAGNOSIS — Z8711 Personal history of peptic ulcer disease: Secondary | ICD-10-CM

## 2015-05-28 DIAGNOSIS — E876 Hypokalemia: Secondary | ICD-10-CM | POA: Diagnosis present

## 2015-05-28 DIAGNOSIS — E785 Hyperlipidemia, unspecified: Secondary | ICD-10-CM | POA: Diagnosis present

## 2015-05-28 DIAGNOSIS — I110 Hypertensive heart disease with heart failure: Secondary | ICD-10-CM | POA: Diagnosis present

## 2015-05-28 DIAGNOSIS — I10 Essential (primary) hypertension: Secondary | ICD-10-CM | POA: Diagnosis present

## 2015-05-28 DIAGNOSIS — J9601 Acute respiratory failure with hypoxia: Secondary | ICD-10-CM | POA: Diagnosis present

## 2015-05-28 DIAGNOSIS — Z87891 Personal history of nicotine dependence: Secondary | ICD-10-CM

## 2015-05-28 DIAGNOSIS — I4891 Unspecified atrial fibrillation: Secondary | ICD-10-CM | POA: Diagnosis present

## 2015-05-28 DIAGNOSIS — Z9114 Patient's other noncompliance with medication regimen: Secondary | ICD-10-CM

## 2015-05-28 DIAGNOSIS — R7989 Other specified abnormal findings of blood chemistry: Secondary | ICD-10-CM | POA: Diagnosis present

## 2015-05-28 DIAGNOSIS — Z79899 Other long term (current) drug therapy: Secondary | ICD-10-CM | POA: Diagnosis not present

## 2015-05-28 DIAGNOSIS — R0609 Other forms of dyspnea: Secondary | ICD-10-CM | POA: Diagnosis present

## 2015-05-28 DIAGNOSIS — M1990 Unspecified osteoarthritis, unspecified site: Secondary | ICD-10-CM | POA: Diagnosis present

## 2015-05-28 DIAGNOSIS — Z6841 Body Mass Index (BMI) 40.0 and over, adult: Secondary | ICD-10-CM

## 2015-05-28 DIAGNOSIS — K219 Gastro-esophageal reflux disease without esophagitis: Secondary | ICD-10-CM | POA: Diagnosis present

## 2015-05-28 DIAGNOSIS — I5033 Acute on chronic diastolic (congestive) heart failure: Secondary | ICD-10-CM | POA: Diagnosis present

## 2015-05-28 DIAGNOSIS — I5043 Acute on chronic combined systolic (congestive) and diastolic (congestive) heart failure: Secondary | ICD-10-CM | POA: Diagnosis not present

## 2015-05-28 DIAGNOSIS — I5023 Acute on chronic systolic (congestive) heart failure: Secondary | ICD-10-CM

## 2015-05-28 DIAGNOSIS — R778 Other specified abnormalities of plasma proteins: Secondary | ICD-10-CM | POA: Diagnosis present

## 2015-05-28 DIAGNOSIS — R0600 Dyspnea, unspecified: Secondary | ICD-10-CM | POA: Diagnosis not present

## 2015-05-28 DIAGNOSIS — Z91148 Patient's other noncompliance with medication regimen for other reason: Secondary | ICD-10-CM

## 2015-05-28 DIAGNOSIS — J45909 Unspecified asthma, uncomplicated: Secondary | ICD-10-CM | POA: Diagnosis present

## 2015-05-28 LAB — TROPONIN I
TROPONIN I: 0.04 ng/mL — AB (ref <0.031)
TROPONIN I: 0.05 ng/mL — AB (ref <0.031)
TROPONIN I: 0.05 ng/mL — AB (ref <0.031)
Troponin I: 0.05 ng/mL — ABNORMAL HIGH (ref <0.031)
Troponin I: 0.04 ng/mL — ABNORMAL HIGH (ref <0.031)

## 2015-05-28 LAB — MAGNESIUM: Magnesium: 1.8 mg/dL (ref 1.7–2.4)

## 2015-05-28 LAB — CBC WITH DIFFERENTIAL/PLATELET
BASOS ABS: 0 10*3/uL (ref 0.0–0.1)
Basophils Relative: 0 %
EOS ABS: 0 10*3/uL (ref 0.0–0.7)
EOS PCT: 0 %
HCT: 40.5 % (ref 39.0–52.0)
Hemoglobin: 13.4 g/dL (ref 13.0–17.0)
LYMPHS PCT: 11 %
Lymphs Abs: 0.8 10*3/uL (ref 0.7–4.0)
MCH: 29 pg (ref 26.0–34.0)
MCHC: 33.1 g/dL (ref 30.0–36.0)
MCV: 87.7 fL (ref 78.0–100.0)
Monocytes Absolute: 0.5 10*3/uL (ref 0.1–1.0)
Monocytes Relative: 7 %
NEUTROS ABS: 5.9 10*3/uL (ref 1.7–7.7)
NEUTROS PCT: 82 %
PLATELETS: 217 10*3/uL (ref 150–400)
RBC: 4.62 MIL/uL (ref 4.22–5.81)
RDW: 13.7 % (ref 11.5–15.5)
WBC: 7.2 10*3/uL (ref 4.0–10.5)

## 2015-05-28 LAB — COMPREHENSIVE METABOLIC PANEL
ALT: 29 U/L (ref 17–63)
AST: 29 U/L (ref 15–41)
Albumin: 4 g/dL (ref 3.5–5.0)
Alkaline Phosphatase: 57 U/L (ref 38–126)
Anion gap: 12 (ref 5–15)
BUN: 18 mg/dL (ref 6–20)
CHLORIDE: 105 mmol/L (ref 101–111)
CO2: 24 mmol/L (ref 22–32)
CREATININE: 1.2 mg/dL (ref 0.61–1.24)
Calcium: 8.8 mg/dL — ABNORMAL LOW (ref 8.9–10.3)
GFR calc non Af Amer: >60 mL/min (ref >60)
Glucose, Bld: 100 mg/dL — ABNORMAL HIGH (ref 65–99)
Potassium: 3.2 mmol/L — ABNORMAL LOW (ref 3.5–5.1)
SODIUM: 141 mmol/L (ref 135–145)
Total Bilirubin: 2.1 mg/dL — ABNORMAL HIGH (ref 0.3–1.2)
Total Protein: 7 g/dL (ref 6.5–8.1)

## 2015-05-28 LAB — BRAIN NATRIURETIC PEPTIDE: B Natriuretic Peptide: 423 pg/mL — ABNORMAL HIGH (ref 0.0–100.0)

## 2015-05-28 MED ORDER — SODIUM CHLORIDE 0.9 % IJ SOLN
3.0000 mL | Freq: Two times a day (BID) | INTRAMUSCULAR | Status: DC
  Administered 2015-05-28 – 2015-05-29 (×3): 3 mL via INTRAVENOUS

## 2015-05-28 MED ORDER — CLONIDINE HCL 0.2 MG PO TABS
0.2000 mg | ORAL_TABLET | Freq: Two times a day (BID) | ORAL | Status: DC
  Administered 2015-05-28 – 2015-05-29 (×3): 0.2 mg via ORAL
  Filled 2015-05-28 (×3): qty 1

## 2015-05-28 MED ORDER — DILTIAZEM HCL ER COATED BEADS 240 MG PO CP24
240.0000 mg | ORAL_CAPSULE | Freq: Every morning | ORAL | Status: DC
  Administered 2015-05-28 – 2015-05-29 (×2): 240 mg via ORAL
  Filled 2015-05-28 (×2): qty 1

## 2015-05-28 MED ORDER — IPRATROPIUM-ALBUTEROL 0.5-2.5 (3) MG/3ML IN SOLN
3.0000 mL | Freq: Once | RESPIRATORY_TRACT | Status: AC
  Administered 2015-05-28: 3 mL via RESPIRATORY_TRACT
  Filled 2015-05-28: qty 3

## 2015-05-28 MED ORDER — PERFLUTREN LIPID MICROSPHERE
1.0000 mL | INTRAVENOUS | Status: AC | PRN
  Administered 2015-05-28: 3 mL via INTRAVENOUS
  Filled 2015-05-28: qty 10

## 2015-05-28 MED ORDER — FUROSEMIDE 10 MG/ML IJ SOLN
80.0000 mg | Freq: Once | INTRAMUSCULAR | Status: AC
  Administered 2015-05-28: 80 mg via INTRAVENOUS
  Filled 2015-05-28: qty 8

## 2015-05-28 MED ORDER — SODIUM CHLORIDE 0.9 % IV SOLN: 250.0000 mL | INTRAVENOUS | Status: DC | PRN

## 2015-05-28 MED ORDER — FUROSEMIDE 10 MG/ML IJ SOLN
40.0000 mg | Freq: Two times a day (BID) | INTRAMUSCULAR | Status: DC
  Administered 2015-05-28 – 2015-05-29 (×3): 40 mg via INTRAVENOUS
  Filled 2015-05-28 (×3): qty 4

## 2015-05-28 MED ORDER — NITROGLYCERIN 2 % TD OINT
1.0000 [in_us] | TOPICAL_OINTMENT | Freq: Once | TRANSDERMAL | Status: AC
  Administered 2015-05-28: 1 [in_us] via TOPICAL
  Filled 2015-05-28: qty 1

## 2015-05-28 MED ORDER — NITROGLYCERIN 2 % TD OINT
1.0000 [in_us] | TOPICAL_OINTMENT | Freq: Three times a day (TID) | TRANSDERMAL | Status: DC
  Administered 2015-05-28 – 2015-05-29 (×3): 1 [in_us] via TOPICAL
  Filled 2015-05-28 (×3): qty 1

## 2015-05-28 MED ORDER — POTASSIUM CHLORIDE CRYS ER 20 MEQ PO TBCR
20.0000 meq | EXTENDED_RELEASE_TABLET | Freq: Every day | ORAL | Status: DC
  Administered 2015-05-28 – 2015-05-29 (×2): 20 meq via ORAL
  Filled 2015-05-28 (×2): qty 1

## 2015-05-28 MED ORDER — SODIUM CHLORIDE 0.9 % IJ SOLN: 3.0000 mL | INTRAMUSCULAR | Status: DC | PRN

## 2015-05-28 MED ORDER — METOPROLOL SUCCINATE ER 50 MG PO TB24
50.0000 mg | ORAL_TABLET | ORAL | Status: DC
  Administered 2015-05-28: 50 mg via ORAL
  Filled 2015-05-28 (×2): qty 1

## 2015-05-28 MED ORDER — ONDANSETRON HCL 4 MG/2ML IJ SOLN: 4.0000 mg | Freq: Four times a day (QID) | INTRAMUSCULAR | Status: DC | PRN

## 2015-05-28 MED ORDER — ACETAMINOPHEN 325 MG PO TABS: 650.0000 mg | ORAL_TABLET | ORAL | Status: DC | PRN

## 2015-05-28 MED ORDER — POTASSIUM CHLORIDE CRYS ER 20 MEQ PO TBCR
40.0000 meq | EXTENDED_RELEASE_TABLET | Freq: Once | ORAL | Status: AC
  Administered 2015-05-28: 40 meq via ORAL
  Filled 2015-05-28: qty 2

## 2015-05-28 NOTE — Progress Notes (Signed)
Patient admitted to the hospital earlier this morning by Dr. Onalee Hua.  Patient seen and examined. Continues to feel short of breath. He has 1+ edema bilaterally. Lungs are clear.  He has been admitted with leg swelling and shortness of breath. He has a history of noncompliance. He is found to have acute on chronic CHF. He was started on intravenous Lasix. Echocardiogram has been done with report pending. Continue diuretics at this time. Anticipate discharge home the next 1-2 days.  Rob Mciver

## 2015-05-28 NOTE — ED Notes (Signed)
Pt c/o sob x 3 hours

## 2015-05-28 NOTE — H&P (Signed)
PCP:   [name]   Chief Complaint:  Sob, swelling  HPI: 49 yo male h/o chf, htn, afib, noncompliance comes in for a day of sob, and maybe several days of le swelling.  Pt is very poor historian.  He at first says he is compliant with his medications then says he is not.  He skips several days a week because his medications dehydrate him.  Denies fever.  Sob is worse with exertion, relieved with rest.  No chest pain.  Given lasix 80 mg iv in ED and has over 1700 uop already.  Pt was ambulated, and could not walk far without getting sob again.  Referred for admission for uncontrolled bp and chf exacerbation.   Review of Systems:  Positive and negative as per HPI otherwise all other systems are negative  Past Medical History: Past Medical History  Diagnosis Date  . Hypertensive heart disease     Hypertensive heart disease with prior episodes of CHF  . Hyperlipidemia   . Hypertension   . Gastroesophageal reflux disease   . Osteoarthritis   . Peptic ulcer disease   . Morbid obesity (HCC)     /sleep apnea  . Allergic rhinitis     plus h/o asthma  . Tobacco abuse, in remission     Cigarettes discontinued in 2010  . CHF (congestive heart failure) (HCC)   . A-fib (HCC)     2015  . Asthma    Past Surgical History  Procedure Laterality Date  . None      Medications: Prior to Admission medications   Medication Sig Start Date End Date Taking? Authorizing Provider  beclomethasone (QVAR) 80 MCG/ACT inhaler Inhale 2 puffs into the lungs daily.     Historical Provider, MD  cloNIDine (CATAPRES) 0.2 MG tablet Take 1 tablet (0.2 mg total) by mouth 2 (two) times daily. 05/01/15   [name], MD  diltiazem (CARDIZEM CD) 240 MG 24 hr capsule TAKE 1 CAPSULE EVERY MORNING 05/27/15   [name], MD  furosemide (LASIX) 20 MG tablet Take 20 mg by mouth daily.    Historical Provider, MD  guaiFENesin (MUCINEX) 600 MG 12 hr tablet Take 2 tablets (1,200 mg total) by  mouth 2 (two) times daily. Patient taking differently: Take 1,200 mg by mouth 2 (two) times daily as needed for cough or to loosen phlegm.  04/02/15   Erick Blinks, MD  levofloxacin (LEVAQUIN) 750 MG tablet Take 1 tablet (750 mg total) by mouth daily. Patient not taking: Reported on 05/13/2015 04/02/15   Erick Blinks, MD  metoprolol succinate (TOPROL-XL) 50 MG 24 hr tablet Take 50 mg by mouth every other day. Take with or immediately following a meal.    Historical Provider, MD  omeprazole (PRILOSEC) 20 MG capsule Take 1 capsule (20 mg total) by mouth daily. Patient not taking: Reported on 05/13/2015 04/02/15   Erick Blinks, MD  potassium chloride SA (K-DUR,KLOR-CON) 20 MEQ tablet Take 1 tablet (20 mEq total) by mouth daily. 05/13/15   Cathren Laine, MD  rivaroxaban (XARELTO) 20 MG TABS tablet Take 1 tablet (20 mg total) by mouth daily with supper. Patient not taking: Reported on 05/15/2015 04/02/15   Erick Blinks, MD    Allergies:   Allergies  Allergen Reactions  . Bee Venom Shortness Of Breath and Swelling  . Aspirin Other (See Comments)    Chewable 81 mg tablets upsets patients stomach  . Banana Diarrhea    Social History:  reports  that he quit smoking about 7 years ago. His smoking use included Cigarettes. He started smoking about 27 years ago. He has a 10 pack-year smoking history. He has never used smokeless tobacco. He reports that he does not drink alcohol or use illicit drugs.  Family History: Family History  Problem Relation Age of Onset  . Colon cancer Neg Hx   . Inflammatory bowel disease Neg Hx   . Liver disease Neg Hx     Physical Exam: Filed Vitals:   05/28/15 0330 05/28/15 0400 05/28/15 0430 05/28/15 0500  BP: 176/115 158/114 186/122 156/103  Pulse: 96  55 93  Temp:      TempSrc:      Resp: 30  24 21   Height:      Weight:      SpO2: 100%  94% 98%   General appearance: alert, cooperative and no distress Head: Normocephalic, without obvious  abnormality, atraumatic Eyes: negative Nose: Nares normal. Septum midline. Mucosa normal. No drainage or sinus tenderness. Neck: no JVD and supple, symmetrical, trachea midline Lungs: diminished breath sounds bilaterally Heart: irregularly irregular rhythm Abdomen: soft, non-tender; bowel sounds normal; no masses,  no organomegaly Extremities: edema 2+ Pulses: 2+ and symmetric Skin: Skin color, texture, turgor normal. No rashes or lesions Neurologic: Grossly normal    Labs on Admission:   Recent Labs  05/28/15 0150  NA 141  K 3.2*  CL 105  CO2 24  GLUCOSE 100*  BUN 18  CREATININE 1.20  CALCIUM 8.8*    Recent Labs  05/28/15 0150  AST 29  ALT 29  ALKPHOS 57  BILITOT 2.1*  PROT 7.0  ALBUMIN 4.0     Recent Labs  05/28/15 0150  WBC 7.2  NEUTROABS 5.9  HGB 13.4  HCT 40.5  MCV 87.7  PLT 217    Recent Labs  05/28/15 0150 05/28/15 0407  TROPONINI 0.04* 0.05*   Radiological Exams on Admission: Dg Chest 2 View  05/28/2015  CLINICAL DATA:  Acute onset of worsening shortness of breath. Initial encounter. EXAM: CHEST  2 VIEW COMPARISON:  Chest radiograph performed 05/15/2015 FINDINGS: The lungs are well-aerated. Bibasilar and bilateral central airspace opacities raise concern for pulmonary edema, though multifocal pneumonia could have a similar appearance. No definite significant pleural effusion or pneumothorax is seen, though trace fluid is noted along the fissures. The heart is enlarged.  No acute osseous abnormalities are seen. IMPRESSION: Bibasilar and bilateral central airspace opacities raise concern for pulmonary edema, though multifocal pneumonia could have a similar appearance. Cardiomegaly. Electronically Signed   By: Roanna Raider M.D.   On: 05/28/2015 02:38    Old chart reviewed Case discussed with dr Lynelle Doctor cxr reviewed with edema ekg afib no acute issues rate in mid 90s  Assessment/Plan  49 yo male with uncontrolled hypertension, acute chf  exacerbation and hypoxia likely mostly due to noncompliance with medication regimen  Principal Problem:   Acute on chronic diastolic CHF (congestive heart failure) (HCC)-  Place on chf pathway.  Cont ntg paste, give lasix 40 mg iv q 12 hours.  Control bp better.  Serial troponin.  Has not had echo since 2015, will order one for the am.  Stressed the importance to pt of taking his medications, he nods that he understands.  Active Problems:   OBESITY, MORBID- noted   Essential hypertension uncontrolled - resume home meds.  ntg paste until complete serial troponin levels.   Noncompliance with medication regimen- noted, daughter made a negative comment when he  said he takes his medications, at which point he admitted that he did skip days    Acute respiratory failure with hypoxia (HCC)- noted, due to chf exacerbation.  Diuresis should help along with improving bp control   Elevated troponin- marginally abnormal.  Serial.   Dyspnea on exertion- due to above  obs on tele.  Full code.    DAVID,RACHAL A 05/28/2015, 5:35 AM

## 2015-05-28 NOTE — ED Notes (Signed)
Patient ambulate approx 20 feet , O2-Sats drop from 98-90% on room air, placed back on 3L via nasal cannula, O2-100%.

## 2015-05-28 NOTE — ED Provider Notes (Signed)
.CSN: [id number]     Arrival date & time 05/28/15  0119 History   First MD Initiated Contact with Patient 05/28/15 0138     Chief Complaint  Patient presents with  . Shortness of Breath     (Consider location/radiation/quality/duration/timing/severity/associated sxs/prior Treatment) HPI Patient has a history of congestive heart failure and COPD. He reports about 2-1/2 hours ago he started getting more short of breath. He denies any chest pain or chest tightness. He states he has a cough and sometimes it's red or pink tinged then clear. He denies any abdominal swelling. He has mild nausea without vomiting. He states his legs normally swelling at night and they are swollen tonight like usual. He states he was advised by his heart doctor to weigh himself daily however he does not do that   PCP [facility]  Past Medical History  Diagnosis Date  . Hypertensive heart disease     Hypertensive heart disease with prior episodes of CHF  . Hyperlipidemia   . Hypertension   . Gastroesophageal reflux disease   . Osteoarthritis   . Peptic ulcer disease   . Morbid obesity (HCC)     /sleep apnea  . Allergic rhinitis     plus h/o asthma  . Tobacco abuse, in remission     Cigarettes discontinued in 2010  . CHF (congestive heart failure) (HCC)   . A-fib (HCC)     2015  . Asthma    Past Surgical History  Procedure Laterality Date  . None     Family History  Problem Relation Age of Onset  . Colon cancer Neg Hx   . Inflammatory bowel disease Neg Hx   . Liver disease Neg Hx    Social History  Substance Use Topics  . Smoking status: Former Smoker -- 0.50 packs/day for 20 years    Types: Cigarettes    Start date: 04/26/1988    Quit date: 06/23/2007  . Smokeless tobacco: Never Used     Comment: 1 ppd former smoker  . Alcohol Use: No     Comment: no etoh since 2009   Lives with mother and his 2 adult children On disability for CHF, COPD No home oxygen  Review of  Systems  All other systems reviewed and are negative.     Allergies  Bee venom; Aspirin; and Banana  Home Medications   Prior to Admission medications   Medication Sig Start Date End Date Taking? Authorizing Provider  beclomethasone (QVAR) 80 MCG/ACT inhaler Inhale 2 puffs into the lungs daily.     Historical Provider, MD  cloNIDine (CATAPRES) 0.2 MG tablet Take 1 tablet (0.2 mg total) by mouth 2 (two) times daily. 05/01/15   [name], MD  diltiazem (CARDIZEM CD) 240 MG 24 hr capsule TAKE 1 CAPSULE EVERY MORNING 05/27/15   [name], MD  furosemide (LASIX) 20 MG tablet Take 20 mg by mouth daily.    Historical Provider, MD  guaiFENesin (MUCINEX) 600 MG 12 hr tablet Take 2 tablets (1,200 mg total) by mouth 2 (two) times daily. Patient taking differently: Take 1,200 mg by mouth 2 (two) times daily as needed for cough or to loosen phlegm.  04/02/15   Erick Blinks, MD  levofloxacin (LEVAQUIN) 750 MG tablet Take 1 tablet (750 mg total) by mouth daily. Patient not taking: Reported on 05/13/2015 04/02/15   Erick Blinks, MD  metoprolol succinate (TOPROL-XL) 50 MG 24 hr tablet Take 50 mg by mouth every other day.  Take with or immediately following a meal.    Historical Provider, MD  omeprazole (PRILOSEC) 20 MG capsule Take 1 capsule (20 mg total) by mouth daily. Patient not taking: Reported on 05/13/2015 04/02/15   Erick Blinks, MD  potassium chloride SA (K-DUR,KLOR-CON) 20 MEQ tablet Take 1 tablet (20 mEq total) by mouth daily. 05/13/15   Cathren Laine, MD  rivaroxaban (XARELTO) 20 MG TABS tablet Take 1 tablet (20 mg total) by mouth daily with supper. Patient not taking: Reported on 05/15/2015 04/02/15   Erick Blinks, MD   BP 170/128 mmHg  Pulse 101  Temp(Src) 100 F (37.8 C) (Oral)  Resp 28  Ht 6' (1.829 m)  Wt 335 lb (151.955 kg)  BMI 45.42 kg/m2  SpO2 100%  Vital signs normal except for tachycardia and hypertension  Physical Exam  Constitutional: He is  oriented to person, place, and time. He appears well-developed and well-nourished.  Non-toxic appearance. He does not appear ill. No distress.  HENT:  Head: Normocephalic and atraumatic.  Right Ear: External ear normal.  Left Ear: External ear normal.  Nose: Nose normal. No mucosal edema or rhinorrhea.  Mouth/Throat: Oropharynx is clear and moist and mucous membranes are normal. No dental abscesses or uvula swelling.  Eyes: Conjunctivae and EOM are normal. Pupils are equal, round, and reactive to light.  Neck: Normal range of motion and full passive range of motion without pain. Neck supple.  Cardiovascular: Normal rate, regular rhythm and normal heart sounds.  Exam reveals no gallop and no friction rub.   No murmur heard. Pulmonary/Chest: Effort normal. No respiratory distress. He has decreased breath sounds. He has no wheezes. He has no rhonchi. He has rales. He exhibits no tenderness and no crepitus.  Abdominal: Soft. Normal appearance and bowel sounds are normal. He exhibits no distension. There is no tenderness. There is no rebound and no guarding.  Musculoskeletal: Normal range of motion. He exhibits edema. He exhibits no tenderness.  Moves all extremities well.   Neurological: He is alert and oriented to person, place, and time. He has normal strength. No cranial nerve deficit.  Skin: Skin is warm, dry and intact. No rash noted. No erythema. No pallor.  Psychiatric: He has a normal mood and affect. His speech is normal and behavior is normal. His mood appears not anxious.  Nursing note and vitals reviewed.   ED Course  Procedures (including critical care time)  Medications  ipratropium-albuterol (DUONEB) 0.5-2.5 (3) MG/3ML nebulizer solution 3 mL (3 mLs Nebulization Given 05/28/15 0201)  furosemide (LASIX) injection 80 mg (80 mg Intravenous Given 05/28/15 0204)  nitroGLYCERIN (NITROGLYN) 2 % ointment 1 inch (1 inch Topical Given 05/28/15 0204)  potassium chloride SA (K-DUR,KLOR-CON)  CR tablet 40 mEq (40 mEq Oral Given 05/28/15 0241)   Patient was given IV Lasix, and had nitroglycerin paste applied to his skin for both his CHF and his HTN. He was given a DuoNeb.  After reviewing patient's lab results he was given oral potassium.  Recheck at 3 AM patient sitting on side the bed. He has had urinary output at least twice after the IV Lasix. He states his breathing is improving. On reexam his lungs are clear. The first time was 700 cc.   Recheck at 3:50 AM, patient has almost filled the urinal with urine, about 1000 cc. He states he's feeling better. I'm going to have nursing staff ambulate him and see what his pulse ox does. If it is improved he can be discharged home. We  discussed he needs to take extra potassium pills and take some extra Lasix for the next couple days. Patient is unaware of a low-sodium diet and he will be given information about that.  Patient was ambulated by nursing staff. He was only able to walk to the door of his room before he got short of breath. His pulse ox dropped to 90%. She reports his heart rate went up to the 150 range. He had to sit down.  05:25 Dr Onalee Hua, hospitalist, admit to obs, tele   Labs Review Results for orders placed or performed during the hospital encounter of 05/28/15  Comprehensive metabolic panel  Result Value Ref Range   Sodium 141 135 - 145 mmol/L   Potassium 3.2 (L) 3.5 - 5.1 mmol/L   Chloride 105 101 - 111 mmol/L   CO2 24 22 - 32 mmol/L   Glucose, Bld 100 (H) 65 - 99 mg/dL   BUN 18 6 - 20 mg/dL   Creatinine, Ser 1.61 0.61 - 1.24 mg/dL   Calcium 8.8 (L) 8.9 - 10.3 mg/dL   Total Protein 7.0 6.5 - 8.1 g/dL   Albumin 4.0 3.5 - 5.0 g/dL   AST 29 15 - 41 U/L   ALT 29 17 - 63 U/L   Alkaline Phosphatase 57 38 - 126 U/L   Total Bilirubin 2.1 (H) 0.3 - 1.2 mg/dL   GFR calc non Af Amer >60 >60 mL/min   GFR calc Af Amer >60 >60 mL/min   Anion gap 12 5 - 15  Brain natriuretic peptide  Result Value Ref Range   B Natriuretic  Peptide 423.0 (H) 0.0 - 100.0 pg/mL  Troponin I  Result Value Ref Range   Troponin I 0.04 (H) <0.031 ng/mL  CBC with Differential  Result Value Ref Range   WBC 7.2 4.0 - 10.5 K/uL   RBC 4.62 4.22 - 5.81 MIL/uL   Hemoglobin 13.4 13.0 - 17.0 g/dL   HCT 09.6 04.5 - 40.9 %   MCV 87.7 78.0 - 100.0 fL   MCH 29.0 26.0 - 34.0 pg   MCHC 33.1 30.0 - 36.0 g/dL   RDW 81.1 91.4 - 78.2 %   Platelets 217 150 - 400 K/uL   Neutrophils Relative % 82 %   Neutro Abs 5.9 1.7 - 7.7 K/uL   Lymphocytes Relative 11 %   Lymphs Abs 0.8 0.7 - 4.0 K/uL   Monocytes Relative 7 %   Monocytes Absolute 0.5 0.1 - 1.0 K/uL   Eosinophils Relative 0 %   Eosinophils Absolute 0.0 0.0 - 0.7 K/uL   Basophils Relative 0 %   Basophils Absolute 0.0 0.0 - 0.1 K/uL  Troponin I  Result Value Ref Range   Troponin I 0.05 (H) <0.031 ng/mL    Laboratory interpretation all normal except for hypokalemia, positive troponin     Imaging Review Dg Chest 2 View  05/28/2015  CLINICAL DATA:  Acute onset of worsening shortness of breath. Initial encounter. EXAM: CHEST  2 VIEW COMPARISON:  Chest radiograph performed 05/15/2015 FINDINGS: The lungs are well-aerated. Bibasilar and bilateral central airspace opacities raise concern for pulmonary edema, though multifocal pneumonia could have a similar appearance. No definite significant pleural effusion or pneumothorax is seen, though trace fluid is noted along the fissures. The heart is enlarged.  No acute osseous abnormalities are seen. IMPRESSION: Bibasilar and bilateral central airspace opacities raise concern for pulmonary edema, though multifocal pneumonia could have a similar appearance. Cardiomegaly. Electronically Signed   By: Leotis Shames  Chang M.D.   On: 05/28/2015 02:38      Dg Chest 2 View  05/13/2015  CLINICAL DATA:  Cough, shortness of Breath  IMPRESSION: Central vascular congestion without convincing pulmonary edema. Mild infrahilar bronchitic changes. Electronically Signed    By: Natasha Mead M.D.   On: 05/13/2015 09:09   Dg Chest Port 1 View  05/15/2015  CLINICAL DATA:  Shortness of Breath . IMPRESSION: Persistent pulmonary vascular congestion without edema or consolidation. Electronically Signed   By: Bretta Bang III M.D.   On: 05/15/2015 20:05    I have personally reviewed and evaluated these images and lab results as part of my medical decision-making.   EKG Interpretation   Date/Time:  Tuesday May 28 2015 01:37:15 EST Ventricular Rate:  98 PR Interval:    QRS Duration: 89 QT Interval:  358 QTC Calculation: 457 R Axis:   62 Text Interpretation:  Atrial fibrillation Ventricular premature complex  Borderline repolarization abnormality No significant change since last  tracing 15 May 2015 Confirmed by [facility]  MD-I, Salim Forero ([id number]) on 05/28/2015  2:35:23 AM      MDM   Final diagnoses:  Hypokalemia  Acute on chronic systolic congestive heart failure (HCC)  Dyspnea on exertion  Atrial fibrillation, unspecified type [facility])   Plan admission  Devoria Albe, MD, Concha Pyo, MD 05/28/15 [phone]

## 2015-05-29 DIAGNOSIS — I5033 Acute on chronic diastolic (congestive) heart failure: Secondary | ICD-10-CM

## 2015-05-29 LAB — BASIC METABOLIC PANEL
ANION GAP: 7 (ref 5–15)
BUN: 19 mg/dL (ref 6–20)
CHLORIDE: 107 mmol/L (ref 101–111)
CO2: 28 mmol/L (ref 22–32)
Calcium: 8.4 mg/dL — ABNORMAL LOW (ref 8.9–10.3)
Creatinine, Ser: 1.1 mg/dL (ref 0.61–1.24)
GFR calc Af Amer: >60 mL/min (ref >60)
Glucose, Bld: 87 mg/dL (ref 65–99)
POTASSIUM: 3.3 mmol/L — AB (ref 3.5–5.1)
SODIUM: 142 mmol/L (ref 135–145)

## 2015-05-29 MED ORDER — POTASSIUM CHLORIDE CRYS ER 20 MEQ PO TBCR
40.0000 meq | EXTENDED_RELEASE_TABLET | ORAL | Status: DC
  Administered 2015-05-29: 40 meq via ORAL
  Filled 2015-05-29 (×2): qty 2

## 2015-05-29 MED ORDER — FUROSEMIDE 20 MG PO TABS: 40.0000 mg | ORAL_TABLET | Freq: Every day | ORAL | Status: DC

## 2015-05-29 NOTE — Care Management Note (Signed)
Case Management Note  Patient Details  Name: QUASEAN PINHO MRN: [id number] Date of Birth: May 05, 1966  Subjective/Objective:                    Action/Plan:   Expected Discharge Date:                  Expected Discharge Plan:  Home w Home Health Services  In-House Referral:  NA  Discharge planning Services  CM Consult  Post Acute Care Choice:  Home Health Choice offered to:  Patient  DME Arranged:    DME Agency:     HH Arranged:  RN HH Agency:  [facility]  Status of Service:  Completed, signed off  Medicare Important Message Given:    Date Medicare IM Given:    Medicare IM give by:    Date Additional Medicare IM Given:    Additional Medicare Important Message give by:     If discussed at Long Length of Stay Meetings, dates discussed:    Additional Comments: Pt discharged home today with [facility] RN (per pts choice). Alroy Bailiff of [facility] is aware and will collect the pts information from the chart. HH services to start within 48 hours of discharge. No DME needs noted. Pt and pts nurse aware of discharge arrangements. Arlyss Queen [place], RN 05/29/2015, 1:47 PM

## 2015-05-29 NOTE — Care Management Note (Signed)
Case Management Note  Patient Details  Name: [name] MRN: [id number] Date of Birth: [birth year]  Subjective/Objective:                  Pt admitted from home with CHF. Pt lives with his mother and will return home at discharge. Pt is independent with ADL's.   Action/Plan: Cm educated pt on importance of daily weights, medication compliance, and dietary restrictions. Pt verbalized understanding.? Benefit from [facility] RN.  Expected Discharge Date:                  Expected Discharge Plan:  Home/Self Care  In-House Referral:  NA  Discharge planning Services  CM Consult  Post Acute Care Choice:  NA Choice offered to:  NA  DME Arranged:    DME Agency:     HH Arranged:    HH Agency:     Status of Service:  In process, will continue to follow  Medicare Important Message Given:    Date Medicare IM Given:    Medicare IM give by:    Date Additional Medicare IM Given:    Additional Medicare Important Message give by:     If discussed at Long Length of Stay Meetings, dates discussed:    Additional Comments:  Cheryl Flash, RN 05/29/2015, 11:57 AM

## 2015-05-29 NOTE — Discharge Summary (Addendum)
[name], is a 49 y.o. male  DOB January 12, 1966  MRN [id number].  Admission date:  05/28/2015  Admitting Physician  Haydee Monica, MD  Discharge Date:  05/29/2015   Primary MD  Rondel Baton  Recommendations for primary care physician for things to follow:  - Please check CBC, BMP, 2 view chest x-ray during next visit   Admission Diagnosis  Hypokalemia [E87.6] Dyspnea on exertion [R06.09] Acute on chronic systolic congestive heart failure (HCC) [I50.23] Atrial fibrillation, unspecified type (HCC) [I48.91]   Discharge Diagnosis  Hypokalemia [E87.6] Dyspnea on exertion [R06.09] Acute on chronic systolic congestive heart failure (HCC) [I50.23] Atrial fibrillation, unspecified type (HCC) [I48.91]    Principal Problem:   Acute on chronic diastolic CHF (congestive heart failure) (HCC) Active Problems:   OBESITY, MORBID   Essential hypertension   Noncompliance with medication regimen   Acute respiratory failure with hypoxia (HCC)   Elevated troponin   Dyspnea on exertion   Acute on chronic diastolic CHF (congestive heart failure), NYHA class 1 (HCC)      Past Medical History  Diagnosis Date  . Hypertensive heart disease     Hypertensive heart disease with prior episodes of CHF  . Hyperlipidemia   . Hypertension   . Gastroesophageal reflux disease   . Osteoarthritis   . Peptic ulcer disease   . Morbid obesity (HCC)     /sleep apnea  . Allergic rhinitis     plus h/o asthma  . Tobacco abuse, in remission     Cigarettes discontinued in 2010  . CHF (congestive heart failure) (HCC)   . A-fib (HCC)     2015  . Asthma     Past Surgical History  Procedure Laterality Date  . None         History of present illness and  Hospital Course:     Kindly see H&P for history of present illness and admission details, please review complete Labs, Consult reports and Test reports  for all details in brief  HPI  from the history and physical done on the day of admission 05/28/2015 49 yo male h/o chf, htn, afib, noncompliance comes in for a day of sob, and maybe several days of le swelling. Pt is very poor historian. He at first says he is compliant with his medications then says he is not. He skips several days a week because his medications dehydrate him. Denies fever. Sob is worse with exertion, relieved with rest. No chest pain. Given lasix 80 mg iv in ED and has over 1700 uop already. Pt was ambulated, and could not walk far without getting sob again. Referred for admission for uncontrolled bp and chf exacerbation.    Hospital Course   acute on chronic diastolic CHF  - This is most likely related to noncompliance with medication , chest x-ray with evidence of volume overload, has elevated BNP, reports he stopped taking Lasix for a few days prior to admission ,  patient started on IV Lasix , diuresis -  1420 over last 24 hours . - Home dose Lasix was increased from 20 to 40 mg oral daily.  Noncompliance - was counseled at length regarding compliant with medication, will arrange for visiting home nurse    hypertension  - Initially uncontrolled, improved, continue with home medication   Hypokalemia - Continue to IV diuresis, repleted prior to discharge, continue with potassium oral supplement  Acute hypoxic respiratory failure  - Resolved with diuresis   Elevated troponin  - Margin is abnormal, at baseline   Discharge Condition:  Stable   Follow UP  Follow-up Information    Follow up with [facility].   Contact information:   [address] [place] [address] [phone]       Follow up with [facility] Schedule an appointment as soon as possible for a visit in 1 week.   Why:  Posthospitalization follow-up   Contact information:   [address] [place] [address] [phone]          Discharge Instructions  and  Discharge Medications    Discharge Instructions    Diet - low sodium heart healthy    Complete by:  As directed      Discharge instructions    Complete by:  As directed   Follow with Primary MD Rondel Baton in 7 days   Get CBC, CMP, 2 view Chest X ray checked  by Primary MD next visit.    Activity: As tolerated with Full fall precautions use walker/cane & assistance as needed   Disposition Home   Diet: Heart Healthy , low sodium , with feeding assistance and aspiration precautions.  For Heart failure patients - Check your Weight same time everyday, if you gain over 2 pounds, or you develop in leg swelling, experience more shortness of breath or chest pain, call your Primary MD immediately. Follow Cardiac Low Salt Diet and 1.5 lit/day fluid restriction.   On your next visit with your primary care physician please Get Medicines reviewed and adjusted.   Please request your Prim.MD to go over all Hospital Tests and Procedure/Radiological results at the follow up, please get all Hospital records sent to your Prim MD by signing hospital release before you go home.   If you experience worsening of your admission symptoms, develop shortness of breath, life threatening emergency, suicidal or homicidal thoughts you must seek medical attention immediately by calling 911 or calling your MD immediately  if symptoms less severe.  You Must read complete instructions/literature along with all the possible adverse reactions/side effects for all the Medicines you take and that have been prescribed to you. Take any new Medicines after you have completely understood and accpet all the possible adverse reactions/side effects.   Do not drive, operating heavy machinery, perform activities at heights, swimming or participation in water activities or provide baby sitting services if your were admitted for syncope or siezures until you have seen by Primary MD  or a Neurologist and advised to do so again.  Do not drive when taking Pain medications.    Do not take more than prescribed Pain, Sleep and Anxiety Medications  Special Instructions: If you have smoked or chewed Tobacco  in the last 2 yrs please stop smoking, stop any regular Alcohol  and or any Recreational drug use.  Wear Seat belts while driving.   Please note  You were cared for by a hospitalist during your hospital stay. If you have any  questions about your discharge medications or the care you received while you were in the hospital after you are discharged, you can call the unit and asked to speak with the hospitalist on call if the hospitalist that took care of you is not available. Once you are discharged, your primary care physician will handle any further medical issues. Please note that NO REFILLS for any discharge medications will be authorized once you are discharged, as it is imperative that you return to your primary care physician (or establish a relationship with a primary care physician if you do not have one) for your aftercare needs so that they can reassess your need for medications and monitor your lab values.     Increase activity slowly    Complete by:  As directed             Medication List    STOP taking these medications        guaiFENesin 600 MG 12 hr tablet  Commonly known as:  MUCINEX     levofloxacin 750 MG tablet  Commonly known as:  LEVAQUIN      TAKE these medications        ALKA-SELTZER ANTACID PO  Take 1 tablet by mouth as needed (upset stomach).     beclomethasone 80 MCG/ACT inhaler  Commonly known as:  QVAR  Inhale 2 puffs into the lungs daily.     cloNIDine 0.2 MG tablet  Commonly known as:  CATAPRES  Take 1 tablet (0.2 mg total) by mouth 2 (two) times daily.     diltiazem 240 MG 24 hr capsule  Commonly known as:  CARDIZEM CD  TAKE 1 CAPSULE EVERY MORNING     furosemide 20 MG tablet  Commonly known as:  LASIX  Take 2  tablets (40 mg total) by mouth daily.     metoprolol succinate 50 MG 24 hr tablet  Commonly known as:  TOPROL-XL  Take 50 mg by mouth every other day. Take with or immediately following a meal.     omeprazole 20 MG capsule  Commonly known as:  PRILOSEC  Take 1 capsule (20 mg total) by mouth daily.     potassium chloride SA 20 MEQ tablet  Commonly known as:  K-DUR,KLOR-CON  Take 1 tablet (20 mEq total) by mouth daily.     rivaroxaban 20 MG Tabs tablet  Commonly known as:  XARELTO  Take 1 tablet (20 mg total) by mouth daily with supper.          Diet and Activity recommendation: See Discharge Instructions above   Consults obtained -  None   Major procedures and Radiology Reports - PLEASE review detailed and final reports for all details, in brief -      Dg Chest 2 View  05/28/2015  CLINICAL DATA:  Acute onset of worsening shortness of breath. Initial encounter. EXAM: CHEST  2 VIEW COMPARISON:  Chest radiograph performed 05/15/2015 FINDINGS: The lungs are well-aerated. Bibasilar and bilateral central airspace opacities raise concern for pulmonary edema, though multifocal pneumonia could have a similar appearance. No definite significant pleural effusion or pneumothorax is seen, though trace fluid is noted along the fissures. The heart is enlarged.  No acute osseous abnormalities are seen. IMPRESSION: Bibasilar and bilateral central airspace opacities raise concern for pulmonary edema, though multifocal pneumonia could have a similar appearance. Cardiomegaly. Electronically Signed   By: Roanna Raider M.D.   On: 05/28/2015 02:38   Dg Chest 2 View  05/13/2015  CLINICAL  DATA:  Cough, shortness of Breath EXAM: CHEST  2 VIEW COMPARISON:  04/01/2015 FINDINGS: Cardiomediastinal silhouette is stable. Central mild vascular congestion without convincing pulmonary edema. Mild infrahilar bronchitic changes. IMPRESSION: Central vascular congestion without convincing pulmonary edema. Mild  infrahilar bronchitic changes. Electronically Signed   By: Natasha Mead M.D.   On: 05/13/2015 09:09   Dg Chest Port 1 View  05/15/2015  CLINICAL DATA:  Shortness of Breath EXAM: PORTABLE CHEST 1 VIEW COMPARISON:  May 13, 2015 FINDINGS: There is stable cardiomegaly with borderline pulmonary venous hypertension. No frank edema or consolidation. No adenopathy. No bone lesions. IMPRESSION: Persistent pulmonary vascular congestion without edema or consolidation. Electronically Signed   By: Bretta Bang III M.D.   On: 05/15/2015 20:05    Micro Results    No results found for this or any previous visit (from the past 240 hour(s)).     Today   Subjective:   Bhavik Cabiness today has no headache,no chest abdominal pain,no new weakness tingling or numbness, feels much better wants to go home today.  Objective:   Blood pressure 148/94, pulse 70, temperature 98.2 F (36.8 C), temperature source Oral, resp. rate 20, height 6' (1.829 m), weight 140.3 kg (309 lb 4.9 oz), SpO2 94 %.   Intake/Output Summary (Last 24 hours) at 05/29/15 1402 Last data filed at 05/29/15 0900  Gross per 24 hour  Intake    600 ml  Output      0 ml  Net    600 ml    Exam Awake Alert, Oriented x 3, No new F.N deficits, Normal affect [place].AT,PERRAL Supple Neck,No JVD, No cervical lymphadenopathy appriciated.  Symmetrical Chest wall movement, Good air movement bilaterally, CTAB RRR,No Gallops,Rubs or new Murmurs, No Parasternal Heave +ve B.Sounds, Abd Soft, Non tender, No organomegaly appriciated, No rebound -guarding or rigidity. No Cyanosis, Clubbing , No new Rash or bruise, mild pitting edema   Data Review   CBC w Diff: Lab Results  Component Value Date   WBC 7.2 05/28/2015   HGB 13.4 05/28/2015   HCT 40.5 05/28/2015   PLT 217 05/28/2015   LYMPHOPCT 11 05/28/2015   MONOPCT 7 05/28/2015   EOSPCT 0 05/28/2015   BASOPCT 0 05/28/2015    CMP: Lab Results  Component Value Date   NA 142 05/29/2015    K 3.3* 05/29/2015   CL 107 05/29/2015   CO2 28 05/29/2015   BUN 19 05/29/2015   CREATININE 1.10 05/29/2015   CREATININE 1.02 07/07/2011   PROT 7.0 05/28/2015   ALBUMIN 4.0 05/28/2015   BILITOT 2.1* 05/28/2015   ALKPHOS 57 05/28/2015   AST 29 05/28/2015   ALT 29 05/28/2015  .   Total Time in preparing paper work, data evaluation and todays exam - 35 minutes  Jehieli Brassell M.D on 05/29/2015 at 2:02 PM  [facility]   Office  [phone]

## 2015-05-29 NOTE — Progress Notes (Signed)
Patient states understanding of discharge instructions.  

## 2015-05-29 NOTE — Discharge Instructions (Signed)
Follow with Primary MD Rondel Baton in 7 days   Get CBC, CMP, 2 view Chest X ray checked  by Primary MD next visit.    Activity: As tolerated with Full fall precautions use walker/cane & assistance as needed   Disposition Home   Diet: Heart Healthy , low sodium , with feeding assistance and aspiration precautions.  For Heart failure patients - Check your Weight same time everyday, if you gain over 2 pounds, or you develop in leg swelling, experience more shortness of breath or chest pain, call your Primary MD immediately. Follow Cardiac Low Salt Diet and 1.5 lit/day fluid restriction.   On your next visit with your primary care physician please Get Medicines reviewed and adjusted.   Please request your Prim.MD to go over all Hospital Tests and Procedure/Radiological results at the follow up, please get all Hospital records sent to your Prim MD by signing hospital release before you go home.   If you experience worsening of your admission symptoms, develop shortness of breath, life threatening emergency, suicidal or homicidal thoughts you must seek medical attention immediately by calling 911 or calling your MD immediately  if symptoms less severe.  You Must read complete instructions/literature along with all the possible adverse reactions/side effects for all the Medicines you take and that have been prescribed to you. Take any new Medicines after you have completely understood and accpet all the possible adverse reactions/side effects.   Do not drive, operating heavy machinery, perform activities at heights, swimming or participation in water activities or provide baby sitting services if your were admitted for syncope or siezures until you have seen by Primary MD or a Neurologist and advised to do so again.  Do not drive when taking Pain medications.    Do not take more than prescribed Pain, Sleep and Anxiety Medications  Special Instructions: If you have smoked or  chewed Tobacco  in the last 2 yrs please stop smoking, stop any regular Alcohol  and or any Recreational drug use.  Wear Seat belts while driving.   Please note  You were cared for by a hospitalist during your hospital stay. If you have any questions about your discharge medications or the care you received while you were in the hospital after you are discharged, you can call the unit and asked to speak with the hospitalist on call if the hospitalist that took care of you is not available. Once you are discharged, your primary care physician will handle any further medical issues. Please note that NO REFILLS for any discharge medications will be authorized once you are discharged, as it is imperative that you return to your primary care physician (or establish a relationship with a primary care physician if you do not have one) for your aftercare needs so that they can reassess your need for medications and monitor your lab values.

## 2015-06-13 ENCOUNTER — Encounter (HOSPITAL_COMMUNITY): Payer: Self-pay | Admitting: *Deleted

## 2015-06-13 ENCOUNTER — Emergency Department (HOSPITAL_COMMUNITY): Payer: Medicare Other

## 2015-06-13 ENCOUNTER — Inpatient Hospital Stay (HOSPITAL_COMMUNITY)
Admission: EM | Admit: 2015-06-13 | Discharge: 2015-06-17 | DRG: 287 | Disposition: A | Discharge status: Home Health | Payer: Medicare Other | Attending: Internal Medicine | Admitting: Internal Medicine

## 2015-06-13 DIAGNOSIS — I272 Other secondary pulmonary hypertension: Secondary | ICD-10-CM | POA: Diagnosis present

## 2015-06-13 DIAGNOSIS — I482 Chronic atrial fibrillation: Secondary | ICD-10-CM | POA: Diagnosis not present

## 2015-06-13 DIAGNOSIS — I5031 Acute diastolic (congestive) heart failure: Secondary | ICD-10-CM | POA: Diagnosis not present

## 2015-06-13 DIAGNOSIS — I5033 Acute on chronic diastolic (congestive) heart failure: Secondary | ICD-10-CM

## 2015-06-13 DIAGNOSIS — I249 Acute ischemic heart disease, unspecified: Secondary | ICD-10-CM

## 2015-06-13 DIAGNOSIS — I110 Hypertensive heart disease with heart failure: Principal | ICD-10-CM | POA: Diagnosis present

## 2015-06-13 DIAGNOSIS — J45909 Unspecified asthma, uncomplicated: Secondary | ICD-10-CM | POA: Diagnosis not present

## 2015-06-13 DIAGNOSIS — Z886 Allergy status to analgesic agent status: Secondary | ICD-10-CM

## 2015-06-13 DIAGNOSIS — E876 Hypokalemia: Secondary | ICD-10-CM | POA: Diagnosis present

## 2015-06-13 DIAGNOSIS — E662 Morbid (severe) obesity with alveolar hypoventilation: Secondary | ICD-10-CM | POA: Diagnosis present

## 2015-06-13 DIAGNOSIS — Z91018 Allergy to other foods: Secondary | ICD-10-CM | POA: Diagnosis not present

## 2015-06-13 DIAGNOSIS — I10 Essential (primary) hypertension: Secondary | ICD-10-CM | POA: Diagnosis not present

## 2015-06-13 DIAGNOSIS — R0789 Other chest pain: Secondary | ICD-10-CM | POA: Diagnosis not present

## 2015-06-13 DIAGNOSIS — I509 Heart failure, unspecified: Secondary | ICD-10-CM

## 2015-06-13 DIAGNOSIS — I480 Paroxysmal atrial fibrillation: Secondary | ICD-10-CM | POA: Diagnosis not present

## 2015-06-13 DIAGNOSIS — Z6841 Body Mass Index (BMI) 40.0 and over, adult: Secondary | ICD-10-CM

## 2015-06-13 DIAGNOSIS — Z7901 Long term (current) use of anticoagulants: Secondary | ICD-10-CM

## 2015-06-13 DIAGNOSIS — Z8711 Personal history of peptic ulcer disease: Secondary | ICD-10-CM

## 2015-06-13 DIAGNOSIS — R7989 Other specified abnormal findings of blood chemistry: Secondary | ICD-10-CM | POA: Diagnosis present

## 2015-06-13 DIAGNOSIS — I4891 Unspecified atrial fibrillation: Secondary | ICD-10-CM | POA: Diagnosis not present

## 2015-06-13 DIAGNOSIS — R778 Other specified abnormalities of plasma proteins: Secondary | ICD-10-CM | POA: Diagnosis present

## 2015-06-13 DIAGNOSIS — K219 Gastro-esophageal reflux disease without esophagitis: Secondary | ICD-10-CM | POA: Diagnosis not present

## 2015-06-13 DIAGNOSIS — E785 Hyperlipidemia, unspecified: Secondary | ICD-10-CM | POA: Diagnosis present

## 2015-06-13 DIAGNOSIS — Z91030 Bee allergy status: Secondary | ICD-10-CM

## 2015-06-13 DIAGNOSIS — Z79899 Other long term (current) drug therapy: Secondary | ICD-10-CM

## 2015-06-13 DIAGNOSIS — I214 Non-ST elevation (NSTEMI) myocardial infarction: Secondary | ICD-10-CM | POA: Diagnosis present

## 2015-06-13 DIAGNOSIS — Z87891 Personal history of nicotine dependence: Secondary | ICD-10-CM

## 2015-06-13 HISTORY — DX: Other specified abnormal findings of blood chemistry: R79.89

## 2015-06-13 HISTORY — DX: Other specified abnormalities of plasma proteins: R77.8

## 2015-06-13 LAB — CBC WITH DIFFERENTIAL/PLATELET
BASOS PCT: 1 %
Basophils Absolute: 0 10*3/uL (ref 0.0–0.1)
EOS ABS: 0.1 10*3/uL (ref 0.0–0.7)
Eosinophils Relative: 1 %
HEMATOCRIT: 36.6 % — AB (ref 39.0–52.0)
Hemoglobin: 12.2 g/dL — ABNORMAL LOW (ref 13.0–17.0)
LYMPHS ABS: 1 10*3/uL (ref 0.7–4.0)
Lymphocytes Relative: 26 %
MCH: 28.8 pg (ref 26.0–34.0)
MCHC: 33.3 g/dL (ref 30.0–36.0)
MCV: 86.3 fL (ref 78.0–100.0)
MONO ABS: 0.3 10*3/uL (ref 0.1–1.0)
MONOS PCT: 7 %
Neutro Abs: 2.7 10*3/uL (ref 1.7–7.7)
Neutrophils Relative %: 65 %
PLATELETS: 223 10*3/uL (ref 150–400)
RBC: 4.24 MIL/uL (ref 4.22–5.81)
RDW: 13.7 % (ref 11.5–15.5)
WBC: 4.1 10*3/uL (ref 4.0–10.5)

## 2015-06-13 LAB — URINALYSIS, ROUTINE W REFLEX MICROSCOPIC
Glucose, UA: NEGATIVE mg/dL
HGB URINE DIPSTICK: NEGATIVE
Ketones, ur: NEGATIVE mg/dL
Leukocytes, UA: NEGATIVE
Nitrite: NEGATIVE
PH: 5.5 (ref 5.0–8.0)
SPECIFIC GRAVITY, URINE: 1.02 (ref 1.005–1.030)

## 2015-06-13 LAB — BRAIN NATRIURETIC PEPTIDE: B Natriuretic Peptide: 531 pg/mL — ABNORMAL HIGH (ref 0.0–100.0)

## 2015-06-13 LAB — COMPREHENSIVE METABOLIC PANEL
ALBUMIN: 3.4 g/dL — AB (ref 3.5–5.0)
ALK PHOS: 49 U/L (ref 38–126)
ALT: 24 U/L (ref 17–63)
AST: 37 U/L (ref 15–41)
Anion gap: 7 (ref 5–15)
BUN: 16 mg/dL (ref 6–20)
CALCIUM: 8.6 mg/dL — AB (ref 8.9–10.3)
CO2: 25 mmol/L (ref 22–32)
CREATININE: 1.05 mg/dL (ref 0.61–1.24)
Chloride: 109 mmol/L (ref 101–111)
GFR calc Af Amer: >60 mL/min (ref >60)
GFR calc non Af Amer: >60 mL/min (ref >60)
GLUCOSE: 93 mg/dL (ref 65–99)
Potassium: 3 mmol/L — ABNORMAL LOW (ref 3.5–5.1)
SODIUM: 141 mmol/L (ref 135–145)
Total Bilirubin: 2 mg/dL — ABNORMAL HIGH (ref 0.3–1.2)
Total Protein: 6.2 g/dL — ABNORMAL LOW (ref 6.5–8.1)

## 2015-06-13 LAB — URINE MICROSCOPIC-ADD ON
Bacteria, UA: NONE SEEN
Squamous Epithelial / LPF: NONE SEEN
WBC UA: NONE SEEN WBC/hpf (ref 0–5)

## 2015-06-13 LAB — GLUCOSE, CAPILLARY: Glucose-Capillary: 76 mg/dL (ref 65–99)

## 2015-06-13 LAB — TROPONIN I
TROPONIN I: 2.58 ng/mL — AB (ref <0.031)
Troponin I: 2.73 ng/mL (ref <0.031)
Troponin I: 1.45 ng/mL (ref <0.031)

## 2015-06-13 LAB — HEPARIN LEVEL (UNFRACTIONATED): HEPARIN UNFRACTIONATED: 0.31 [IU]/mL (ref 0.30–0.70)

## 2015-06-13 LAB — MRSA PCR SCREENING: MRSA BY PCR: NEGATIVE

## 2015-06-13 MED ORDER — SODIUM CHLORIDE 0.9 % IJ SOLN
3.0000 mL | Freq: Two times a day (BID) | INTRAMUSCULAR | Status: DC
  Administered 2015-06-13: 3 mL via INTRAVENOUS

## 2015-06-13 MED ORDER — POTASSIUM CHLORIDE CRYS ER 20 MEQ PO TBCR
40.0000 meq | EXTENDED_RELEASE_TABLET | Freq: Once | ORAL | Status: AC
  Administered 2015-06-13: 40 meq via ORAL
  Filled 2015-06-13: qty 2

## 2015-06-13 MED ORDER — ATORVASTATIN CALCIUM 40 MG PO TABS
80.0000 mg | ORAL_TABLET | Freq: Every day | ORAL | Status: DC
  Administered 2015-06-13 – 2015-06-16 (×4): 80 mg via ORAL
  Filled 2015-06-13 (×5): qty 2

## 2015-06-13 MED ORDER — MORPHINE SULFATE (PF) 4 MG/ML IV SOLN: 4.0000 mg | INTRAVENOUS | Status: DC | PRN

## 2015-06-13 MED ORDER — ASPIRIN 325 MG PO TABS
325.0000 mg | ORAL_TABLET | Freq: Every day | ORAL | Status: DC
  Administered 2015-06-13: 325 mg via ORAL
  Filled 2015-06-13: qty 1

## 2015-06-13 MED ORDER — FUROSEMIDE 10 MG/ML IJ SOLN
40.0000 mg | Freq: Once | INTRAMUSCULAR | Status: AC
  Administered 2015-06-13: 40 mg via INTRAVENOUS
  Filled 2015-06-13: qty 4

## 2015-06-13 MED ORDER — DILTIAZEM HCL ER COATED BEADS 240 MG PO CP24
240.0000 mg | ORAL_CAPSULE | Freq: Every morning | ORAL | Status: DC
  Administered 2015-06-15 – 2015-06-17 (×3): 240 mg via ORAL
  Filled 2015-06-13 (×3): qty 1

## 2015-06-13 MED ORDER — ONDANSETRON HCL 4 MG PO TABS: 4.0000 mg | ORAL_TABLET | Freq: Four times a day (QID) | ORAL | Status: DC | PRN

## 2015-06-13 MED ORDER — HYDRALAZINE HCL 20 MG/ML IJ SOLN
10.0000 mg | Freq: Four times a day (QID) | INTRAMUSCULAR | Status: DC | PRN
  Filled 2015-06-13: qty 1

## 2015-06-13 MED ORDER — BECLOMETHASONE DIPROPIONATE 80 MCG/ACT IN AERS: 2.0000 | INHALATION_SPRAY | Freq: Every day | RESPIRATORY_TRACT | Status: DC

## 2015-06-13 MED ORDER — CLONIDINE HCL 0.1 MG PO TABS: 0.2000 mg | ORAL_TABLET | Freq: Two times a day (BID) | ORAL | Status: DC

## 2015-06-13 MED ORDER — CLONIDINE HCL 0.2 MG PO TABS
0.3000 mg | ORAL_TABLET | Freq: Two times a day (BID) | ORAL | Status: DC
  Administered 2015-06-13 – 2015-06-17 (×7): 0.3 mg via ORAL
  Filled 2015-06-13 (×3): qty 1
  Filled 2015-06-13 (×3): qty 3
  Filled 2015-06-13: qty 1

## 2015-06-13 MED ORDER — SODIUM CHLORIDE 0.9 % IJ SOLN
3.0000 mL | Freq: Two times a day (BID) | INTRAMUSCULAR | Status: DC
  Administered 2015-06-13 – 2015-06-14 (×2): 3 mL via INTRAVENOUS

## 2015-06-13 MED ORDER — ASPIRIN EC 325 MG PO TBEC: 325.0000 mg | DELAYED_RELEASE_TABLET | Freq: Every day | ORAL | Status: DC

## 2015-06-13 MED ORDER — ASPIRIN 81 MG PO CHEW
81.0000 mg | CHEWABLE_TABLET | ORAL | Status: AC
  Administered 2015-06-14: 81 mg via ORAL
  Filled 2015-06-13: qty 1

## 2015-06-13 MED ORDER — FUROSEMIDE 10 MG/ML IJ SOLN
40.0000 mg | Freq: Two times a day (BID) | INTRAMUSCULAR | Status: DC
  Administered 2015-06-13 – 2015-06-16 (×6): 40 mg via INTRAVENOUS
  Filled 2015-06-13 (×6): qty 4

## 2015-06-13 MED ORDER — METOPROLOL SUCCINATE ER 50 MG PO TB24
50.0000 mg | ORAL_TABLET | Freq: Every day | ORAL | Status: DC
  Administered 2015-06-13 – 2015-06-17 (×4): 50 mg via ORAL
  Filled 2015-06-13 (×4): qty 1

## 2015-06-13 MED ORDER — HEPARIN (PORCINE) IN NACL 100-0.45 UNIT/ML-% IJ SOLN
2350.0000 [IU]/h | INTRAMUSCULAR | Status: DC
  Administered 2015-06-13 (×2): 2100 [IU]/h via INTRAVENOUS
  Filled 2015-06-13 (×2): qty 250

## 2015-06-13 MED ORDER — SODIUM CHLORIDE 0.9 % IV SOLN: 250.0000 mL | INTRAVENOUS | Status: DC | PRN

## 2015-06-13 MED ORDER — SODIUM CHLORIDE 0.9 % IJ SOLN: 3.0000 mL | INTRAMUSCULAR | Status: DC | PRN

## 2015-06-13 MED ORDER — METOPROLOL SUCCINATE ER 50 MG PO TB24: 50.0000 mg | ORAL_TABLET | ORAL | Status: DC

## 2015-06-13 MED ORDER — PANTOPRAZOLE SODIUM 40 MG IV SOLR
40.0000 mg | Freq: Two times a day (BID) | INTRAVENOUS | Status: DC
  Administered 2015-06-13: 40 mg via INTRAVENOUS
  Filled 2015-06-13: qty 40

## 2015-06-13 MED ORDER — SODIUM CHLORIDE 0.9 % IV SOLN
INTRAVENOUS | Status: DC
  Administered 2015-06-14: 06:00:00 via INTRAVENOUS

## 2015-06-13 MED ORDER — NITROGLYCERIN IN D5W 200-5 MCG/ML-% IV SOLN
5.0000 ug/min | INTRAVENOUS | Status: DC
  Administered 2015-06-13: 5 ug/min via INTRAVENOUS
  Filled 2015-06-13: qty 250

## 2015-06-13 MED ORDER — ONDANSETRON HCL 4 MG/2ML IJ SOLN: 4.0000 mg | Freq: Four times a day (QID) | INTRAMUSCULAR | Status: DC | PRN

## 2015-06-13 MED ORDER — HEPARIN BOLUS VIA INFUSION
4000.0000 [IU] | Freq: Once | INTRAVENOUS | Status: AC
  Administered 2015-06-13: 4000 [IU] via INTRAVENOUS

## 2015-06-13 MED ORDER — BUDESONIDE 0.5 MG/2ML IN SUSP
0.5000 mg | Freq: Every day | RESPIRATORY_TRACT | Status: DC
  Administered 2015-06-15: 0.5 mg via RESPIRATORY_TRACT
  Filled 2015-06-13: qty 2

## 2015-06-13 MED ORDER — NITROGLYCERIN 0.4 MG SL SUBL
0.4000 mg | SUBLINGUAL_TABLET | Freq: Once | SUBLINGUAL | Status: DC
  Filled 2015-06-13: qty 1

## 2015-06-13 NOTE — Consult Note (Signed)
Primary Physician: Primary Cardiologist:  Purvis Sheffield   HPI: Pt is a 49 yo who presents to ER for eval of CP Pt has no known CAD  Followed in cardiology clinic  Seen on 05/01/15.  Hx of severe HTN, chronic diastolic CHF, NSVT and Afib.  Has not been taking anticoagulation  Fearful    He presents to ER with a couple  wk history of SOB and chst tightness with activity  Walking across house pt gets tightness and SOB  Some pink sputom production.  Feels better now that he has had a dose of lasix  Denies f/c  No palpitations    Discharged on 12/7 after admission for diastolic CHF  Echo on that admit showed severe LVH  LVEf 55%  Severe LAE and mod RAE.  RVEF mildly reduced           Past Medical History  Diagnosis Date  . Hypertensive heart disease     Hypertensive heart disease with prior episodes of CHF  . Hyperlipidemia   . Hypertension   . Gastroesophageal reflux disease   . Osteoarthritis   . Peptic ulcer disease   . Morbid obesity (HCC)     /sleep apnea  . Allergic rhinitis     plus h/o asthma  . Tobacco abuse, in remission     Cigarettes discontinued in 2010  . CHF (congestive heart failure) (HCC)   . A-fib (HCC)     2015  . Asthma     Medications Prior to Admission  Medication Sig Dispense Refill  . beclomethasone (QVAR) 80 MCG/ACT inhaler Inhale 2 puffs into the lungs daily.     . Calcium Carbonate Antacid (ALKA-SELTZER ANTACID PO) Take 1 tablet by mouth as needed (upset stomach).    . cloNIDine (CATAPRES) 0.2 MG tablet Take 1 tablet (0.2 mg total) by mouth 2 (two) times daily. 60 tablet 6  . diltiazem (CARDIZEM CD) 240 MG 24 hr capsule TAKE 1 CAPSULE EVERY MORNING 90 capsule 3  . furosemide (LASIX) 20 MG tablet Take 2 tablets (40 mg total) by mouth daily. 30 tablet 0  . metoprolol succinate (TOPROL-XL) 50 MG 24 hr tablet Take 50 mg by mouth every other day. Take with or immediately following a meal.    . omeprazole (PRILOSEC) 20 MG capsule Take 1 capsule (20 mg  total) by mouth daily. (Patient not taking: Reported on 05/13/2015) 30 capsule 11  . potassium chloride SA (K-DUR,KLOR-CON) 20 MEQ tablet Take 1 tablet (20 mEq total) by mouth daily. (Patient not taking: Reported on 05/28/2015) 10 tablet 0  . rivaroxaban (XARELTO) 20 MG TABS tablet Take 1 tablet (20 mg total) by mouth daily with supper. (Patient not taking: Reported on 05/15/2015) 30 tablet      . [START ON 06/14/2015] aspirin EC  325 mg Oral Daily  . atorvastatin  80 mg Oral q1800  . [START ON 06/14/2015] budesonide (PULMICORT) nebulizer solution  0.5 mg Nebulization Daily  . cloNIDine  0.2 mg Oral BID  . [START ON 06/14/2015] diltiazem  240 mg Oral q morning - 10a  . furosemide  40 mg Intravenous Q12H  . [START ON 06/14/2015] metoprolol succinate  50 mg Oral QODAY  . nitroGLYCERIN  0.4 mg Sublingual Once  . pantoprazole (PROTONIX) IV  40 mg Intravenous Q12H  . potassium chloride  40 mEq Oral Once  . sodium chloride  3 mL Intravenous Q12H    Infusions: . heparin 2,100 Units (06/13/15 1700)  . nitroGLYCERIN 5 mcg/min (  06/13/15 1700)    Allergies  Allergen Reactions  . Bee Venom Shortness Of Breath and Swelling  . Aspirin Other (See Comments)    Chewable 81 mg tablets upsets patients stomach  . Banana Diarrhea    Social History   Social History  . Marital Status: Divorced    Spouse Name: N/A  . Number of Children: 3  . Years of Education: N/A   Occupational History  . disabled    Social History Main Topics  . Smoking status: Former Smoker -- 0.50 packs/day for 20 years    Types: Cigarettes    Start date: 04/26/1988    Quit date: 06/23/2007  . Smokeless tobacco: Never Used     Comment: 1 ppd former smoker  . Alcohol Use: No     Comment: no etoh since 2009  . Drug Use: No  . Sexual Activity: No   Other Topics Concern  . Not on file   Social History Narrative    Family History  Problem Relation Age of Onset  . Colon cancer Neg Hx   . Inflammatory bowel  disease Neg Hx   . Liver disease Neg Hx     REVIEW OF SYSTEMS:  All systems reviewed  Negative to the above problem except as noted above.    PHYSICAL EXAM: Filed Vitals:   06/13/15 1552 06/13/15 1650  BP: 164/107 159/112  Pulse: 88   Temp: 98.5 F (36.9 C) 98.2 F (36.8 C)  Resp: 28      Intake/Output Summary (Last 24 hours) at 06/13/15 1721 Last data filed at 06/13/15 1700  Gross per 24 hour  Intake   22.5 ml  Output   2025 ml  Net -2002.5 ml    General: Morbidly obese 49 yo in NAD   HEENT: normal Neck: Neck full  Difficult to assess JVP   Carotids 2+ bilat; no bruits. No lymphadenopathy or thryomegaly appreciated. Cor: PMI nondisplaced. Irregular rate & rhythm. No rubs, gallops or murmurs. Lungs: clear to ausculation   Abdomen:  Distended   soft, nontender, nondistended. No hepatosplenomegaly. No bruits or masses. Good bowel sounds. Extremities: no cyanosis, clubbing, rash, edema  L ankle with soft tissue mass (Pt has had for years) Neuro: alert & oriented x 3, moves all 4 extremities w/o difficulty. Affect pleasant.  Deferred further exam as about to tx to [facility]    ECG:  Atrial fibrillation  104 bpm  Sl ST depression in inferior and lateral leads  (ST changes sl more prominent than previous EKG)  Results for orders placed or performed during the hospital encounter of 06/13/15 (from the past 24 hour(s))  Comprehensive metabolic panel     Status: Abnormal   Collection Time: 06/13/15  9:58 AM  Result Value Ref Range   Sodium 141 135 - 145 mmol/L   Potassium 3.0 (L) 3.5 - 5.1 mmol/L   Chloride 109 101 - 111 mmol/L   CO2 25 22 - 32 mmol/L   Glucose, Bld 93 65 - 99 mg/dL   BUN 16 6 - 20 mg/dL   Creatinine, Ser 0.98 0.61 - 1.24 mg/dL   Calcium 8.6 (L) 8.9 - 10.3 mg/dL   Total Protein 6.2 (L) 6.5 - 8.1 g/dL   Albumin 3.4 (L) 3.5 - 5.0 g/dL   AST 37 15 - 41 U/L   ALT 24 17 - 63 U/L   Alkaline Phosphatase 49 38 - 126 U/L   Total Bilirubin 2.0 (H) 0.3 - 1.2 mg/dL  GFR calc non Af Amer >60 >60 mL/min   GFR calc Af Amer >60 >60 mL/min   Anion gap 7 5 - 15  CBC with Differential     Status: Abnormal   Collection Time: 06/13/15  9:58 AM  Result Value Ref Range   WBC 4.1 4.0 - 10.5 K/uL   RBC 4.24 4.22 - 5.81 MIL/uL   Hemoglobin 12.2 (L) 13.0 - 17.0 g/dL   HCT 42.5 (L) 95.6 - 38.7 %   MCV 86.3 78.0 - 100.0 fL   MCH 28.8 26.0 - 34.0 pg   MCHC 33.3 30.0 - 36.0 g/dL   RDW 56.4 33.2 - 95.1 %   Platelets 223 150 - 400 K/uL   Neutrophils Relative % 65 %   Neutro Abs 2.7 1.7 - 7.7 K/uL   Lymphocytes Relative 26 %   Lymphs Abs 1.0 0.7 - 4.0 K/uL   Monocytes Relative 7 %   Monocytes Absolute 0.3 0.1 - 1.0 K/uL   Eosinophils Relative 1 %   Eosinophils Absolute 0.1 0.0 - 0.7 K/uL   Basophils Relative 1 %   Basophils Absolute 0.0 0.0 - 0.1 K/uL  Brain natriuretic peptide     Status: Abnormal   Collection Time: 06/13/15  9:58 AM  Result Value Ref Range   B Natriuretic Peptide 531.0 (H) 0.0 - 100.0 pg/mL  Troponin I     Status: Abnormal   Collection Time: 06/13/15  9:58 AM  Result Value Ref Range   Troponin I 2.73 (HH) <0.031 ng/mL  Urinalysis, Routine w reflex microscopic     Status: Abnormal   Collection Time: 06/13/15 11:19 AM  Result Value Ref Range   Color, Urine YELLOW YELLOW   APPearance CLEAR CLEAR   Specific Gravity, Urine 1.020 1.005 - 1.030   pH 5.5 5.0 - 8.0   Glucose, UA NEGATIVE NEGATIVE mg/dL   Hgb urine dipstick NEGATIVE NEGATIVE   Bilirubin Urine SMALL (A) NEGATIVE   Ketones, ur NEGATIVE NEGATIVE mg/dL   Protein, ur TRACE (A) NEGATIVE mg/dL   Nitrite NEGATIVE NEGATIVE   Leukocytes, UA NEGATIVE NEGATIVE  Urine microscopic-add on     Status: None   Collection Time: 06/13/15 11:19 AM  Result Value Ref Range   Squamous Epithelial / LPF NONE SEEN NONE SEEN   WBC, UA NONE SEEN 0 - 5 WBC/hpf   RBC / HPF 0-5 0 - 5 RBC/hpf   Bacteria, UA NONE SEEN NONE SEEN  Troponin I     Status: Abnormal   Collection Time: 06/13/15 11:29 AM   Result Value Ref Range   Troponin I 2.58 (HH) <0.031 ng/mL   Dg Chest 2 View  06/13/2015  CLINICAL DATA:  Productive cough, congestion and shortness of breath for 1 month. EXAM: CHEST  2 VIEW COMPARISON:  05/28/2015 FINDINGS: Cardiomegaly. Bilateral perihilar and lower lobe opacities, improving since prior study. Persistent opacities, right greater than left. No effusions. No acute bony abnormality. IMPRESSION: Improvement in bilateral perihilar and lower lobe opacities with continued opacities, right greater than left. This could reflect improving edema or infection. Stable cardiomegaly. Electronically Signed   By: Charlett Nose M.D.   On: 06/13/2015 09:58     ASSESSMENT: 49 yo with severe HTN, diastolic CHF now with chest tightness/SOB with exertion x 1 wk  On arrival to ER evid of CHF  Clinically improved with 1 dose lasix  No tightness now.  EKg with out acute changes  But, trop is signif elevated.   I would recomm  R and  L heart cath to define anatomy and filling pressures  Discussed with pt who agrees to proceed Heparin for now    2  Atrial fib  Continue rate control and no anticoagulation for now.  CHADSVASc of 2 so far    3.  HTN  Poorly controlled  Titrate meds  Increase clonidine  Metoprolol daily not qod  Diurese and follow    4  Acute on chronic diastolic CHF  Volume status up  Received IV lasix  Recomm R heart cath to define pressures   5.  Lipds  Eval in AM

## 2015-06-13 NOTE — ED Notes (Signed)
Pt left via stretcher with [facility]. Report given

## 2015-06-13 NOTE — ED Notes (Signed)
Pt returned from xr

## 2015-06-13 NOTE — H&P (Signed)
[facility] History and Physical  [name] NWG:[id number] DOB: [birth year]    PCP:   Rondel Baton   Chief Complaint: chest pain and elevated troponin.   HPI: [name] is an 49 y.o. male with hx of obesity, OSA, HTN, acute on chronic diastolic CHF, HTN, marginally elevated troponin last admission, presented to the ER with substernal CP this morning. He also complained of pinkish sputum productive coughs, but no fever or chills.  He has SOB, but no Nausea, vomting or diaphoresis.  He was recently admitted for acute on chronic diastolic here at [facility].  Work up in the ER showed afib with controlled rate, no acute ST T changes, and his troponin was positve to 2.73.  His Cr was 1.05, K of 3.0, and normal WBC with Hb of 12 g per dL.  His CXR showed either improved edema, with perihilar and lower lobe opacity. Cardiology consultation was requested by EDP and Dr Dietrich Pates recommended for patient to be admitted to hospitalist service.  Patient had no hx of GI bleed or ulcer.  He said ASA irritated his stomach.  He was recommended previously to take Xarelto, but he hadn't due to upset stomach.  No reported black or bloody stool.    Rewiew of Systems:  Constitutional: Negative for malaise, fever and chills. No significant weight loss or weight gain Eyes: Negative for eye pain, redness and discharge, diplopia, visual changes, or flashes of light. ENMT: Negative for ear pain, hoarseness, nasal congestion, sinus pressure and sore throat. No headaches; tinnitus, drooling, or problem swallowing. Cardiovascular: Negative for  palpitations, diaphoresis, dyspnea and peripheral edema. ; No orthopnea, PND Respiratory: Negative for cough, hemoptysis, wheezing and stridor. No pleuritic chestpain. Gastrointestinal: Negative for nausea, vomiting, diarrhea, constipation, abdominal pain, melena, blood in stool, hematemesis, jaundice and rectal bleeding.    Genitourinary: Negative for frequency,  dysuria, incontinence,flank pain and hematuria; Musculoskeletal: Negative for back pain and neck pain. Negative for swelling and trauma.;  Skin: . Negative for pruritus, rash, abrasions, bruising and skin lesion.; ulcerations Neuro: Negative for headache, lightheadedness and neck stiffness. Negative for weakness, altered level of consciousness , altered mental status, extremity weakness, burning feet, involuntary movement, seizure and syncope.  Psych: negative for anxiety, depression, insomnia, tearfulness, panic attacks, hallucinations, paranoia, suicidal or homicidal ideation    Past Medical History  Diagnosis Date  . Hypertensive heart disease     Hypertensive heart disease with prior episodes of CHF  . Hyperlipidemia   . Hypertension   . Gastroesophageal reflux disease   . Osteoarthritis   . Peptic ulcer disease   . Morbid obesity (HCC)     /sleep apnea  . Allergic rhinitis     plus h/o asthma  . Tobacco abuse, in remission     Cigarettes discontinued in 2010  . CHF (congestive heart failure) (HCC)   . A-fib (HCC)     2015  . Asthma     Past Surgical History  Procedure Laterality Date  . None      Medications:  HOME MEDS: Prior to Admission medications   Medication Sig Start Date End Date Taking? Authorizing Provider  beclomethasone (QVAR) 80 MCG/ACT inhaler Inhale 2 puffs into the lungs daily.    Yes Historical Provider, MD  Calcium Carbonate Antacid (ALKA-SELTZER ANTACID PO) Take 1 tablet by mouth as needed (upset stomach).   Yes Historical Provider, MD  cloNIDine (CATAPRES) 0.2 MG tablet Take 1 tablet (0.2 mg total) by mouth 2 (  two) times daily. 05/01/15  Yes [name], MD  diltiazem (CARDIZEM CD) 240 MG 24 hr capsule TAKE 1 CAPSULE EVERY MORNING 05/27/15  Yes [name], MD  furosemide (LASIX) 20 MG tablet Take 2 tablets (40 mg total) by mouth daily. 05/29/15  Yes Starleen Arms, MD  metoprolol succinate (TOPROL-XL) 50 MG 24 hr tablet Take 50 mg  by mouth every other day. Take with or immediately following a meal.   Yes Historical Provider, MD  omeprazole (PRILOSEC) 20 MG capsule Take 1 capsule (20 mg total) by mouth daily. Patient not taking: Reported on 05/13/2015 04/02/15   Erick Blinks, MD  potassium chloride SA (K-DUR,KLOR-CON) 20 MEQ tablet Take 1 tablet (20 mEq total) by mouth daily. Patient not taking: Reported on 05/28/2015 05/13/15   Cathren Laine, MD  rivaroxaban (XARELTO) 20 MG TABS tablet Take 1 tablet (20 mg total) by mouth daily with supper. Patient not taking: Reported on 05/15/2015 04/02/15   Erick Blinks, MD     Allergies:  Allergies  Allergen Reactions  . Bee Venom Shortness Of Breath and Swelling  . Aspirin Other (See Comments)    Chewable 81 mg tablets upsets patients stomach  . Banana Diarrhea    Social History:   reports that he quit smoking about 7 years ago. His smoking use included Cigarettes. He started smoking about 27 years ago. He has a 10 pack-year smoking history. He has never used smokeless tobacco. He reports that he does not drink alcohol or use illicit drugs.  Family History: Family History  Problem Relation Age of Onset  . Colon cancer Neg Hx   . Inflammatory bowel disease Neg Hx   . Liver disease Neg Hx      Physical Exam: Filed Vitals:   06/13/15 1000 06/13/15 1015 06/13/15 1100 06/13/15 1214  BP: 159/119  184/113 173/121  Pulse:  92 84 102  Temp:    99.5 F (37.5 C)  TempSrc:    Oral  Resp:  22 36 22  Height:      Weight:      SpO2:  95% 96% 94%   Blood pressure 173/121, pulse 102, temperature 99.5 F (37.5 C), temperature source Oral, resp. rate 22, height 6' (1.829 m), weight 149.687 kg (330 lb), SpO2 94 %.  GEN:  Pleasant  patient lying in the stretcher in no acute distress; cooperative with exam. PSYCH:  alert and oriented x4; does not appear anxious or depressed; affect is appropriate. HEENT: Mucous membranes pink and anicteric; PERRLA; EOM intact; no cervical  lymphadenopathy nor thyromegaly or carotid bruit; no JVD; There were no stridor. Neck is very supple. Breasts:: Not examined CHEST WALL: No tenderness CHEST: Normal respiration, clear to auscultation bilaterally.  HEART: Regular rate and rhythm.  There are no murmur, rub, or gallops.   BACK: No kyphosis or scoliosis; no CVA tenderness ABDOMEN: soft and non-tender; no masses, no organomegaly, normal abdominal bowel sounds; no pannus; no intertriginous candida. There is no rebound and no distention. Rectal Exam: Not done EXTREMITIES: No bone or joint deformity; age-appropriate arthropathy of the hands and knees; no edema; no ulcerations.  There is no calf tenderness. Genitalia: not examined PULSES: 2+ and symmetric SKIN: Normal hydration no rash or ulceration CNS: Cranial nerves 2-12 grossly intact no focal lateralizing neurologic deficit.  Speech is fluent; uvula elevated with phonation, facial symmetry and tongue midline. DTR are normal bilaterally, cerebella exam is intact, barbinski is negative and strengths are equaled bilaterally.  No sensory  loss.   Labs on Admission:  Basic Metabolic Panel:  Recent Labs Lab 06/13/15 0958  NA 141  K 3.0*  CL 109  CO2 25  GLUCOSE 93  BUN 16  CREATININE 1.05  CALCIUM 8.6*   Liver Function Tests:  Recent Labs Lab 06/13/15 0958  AST 37  ALT 24  ALKPHOS 49  BILITOT 2.0*  PROT 6.2*  ALBUMIN 3.4*   CBC:  Recent Labs Lab 06/13/15 0958  WBC 4.1  NEUTROABS 2.7  HGB 12.2*  HCT 36.6*  MCV 86.3  PLT 223   Cardiac Enzymes:  Recent Labs Lab 06/13/15 0958 06/13/15 1129  TROPONINI 2.73* 2.58*   Radiological Exams on Admission: Dg Chest 2 View  06/13/2015  CLINICAL DATA:  Productive cough, congestion and shortness of breath for 1 month. EXAM: CHEST  2 VIEW COMPARISON:  05/28/2015 FINDINGS: Cardiomegaly. Bilateral perihilar and lower lobe opacities, improving since prior study. Persistent opacities, right greater than left. No  effusions. No acute bony abnormality. IMPRESSION: Improvement in bilateral perihilar and lower lobe opacities with continued opacities, right greater than left. This could reflect improving edema or infection. Stable cardiomegaly. Electronically Signed   By: Charlett Nose M.D.   On: 06/13/2015 09:58    EKG: Independently reviewed.    Assessment/Plan Present on Admission:  . A-fib (HCC) . Elevated troponin . ACS (acute coronary syndrome) (HCC) . Hyperlipidemia . Obesity hypoventilation syndrome (HCC)   PLAN:  ACS:  I spoke with Dr Tenny Craw, and it is likely he will require a LHC, so will transfer him to [facility].  Will continue with IV Heparin, and he will be given ASA with PPI prophylaxis.  I will continue with his betablocker, add high dose Statin, and continue his cardiazem for his afib and HTN.  If his pain persists, will start IV NTG as well.   He is made NPO for now.  Continue to cycle his troponins.  Will admit him to SDU at [facility].  Dr Ramiro Harvest accepted him in transfer.  For his CHF, he was given IV Lasix in the ER.  He is stable, full code, and will be admitted to [facility] service.  Thank you and Good Day.   Code Status:FULL CODE.    Houston Siren, MD. FACP [facility] Pager [phone] 7pm to 7am.  06/13/2015, 12:43 PM

## 2015-06-13 NOTE — ED Notes (Signed)
Pt states productive cough, clear and sometimes pink in color. x 1 month. States cough is worse at night. Denies pain.

## 2015-06-13 NOTE — ED Notes (Signed)
EDP at bedside  

## 2015-06-13 NOTE — ED Provider Notes (Signed)
CSN: [id number]     Arrival date & time 06/13/15  0900 History   First MD Initiated Contact with Patient 06/13/15 0914     Chief Complaint  Patient presents with  . Cough     (Consider location/radiation/quality/duration/timing/severity/associated sxs/prior Treatment) HPI Comments: Chief complaint: cough and shortness of breath  Patient is a 49 year old male with a history of atrial fibrillation, hypertension, congestive heart failure. The patient states that he was admitted to the hospital at the beginning of the month and found to have some congestive heart failure. He was treated and released. He states that over the past week he has been getting progressively worse. States now he feels as though he "cannot breathe". He states that sometimes when he coughs he has a pink color to it. He states he has not seen any actual blood. He has some puffiness of his legs, but he says he frequently has swelling of his legs related to varicose veins, and a "knot" on his right lower leg that has been there for several years. The patient denies chest pain, loss of consciousness, lightheadedness, or difficulty taking care of his usual activities of daily living.  The history is provided by the patient.    Past Medical History  Diagnosis Date  . Hypertensive heart disease     Hypertensive heart disease with prior episodes of CHF  . Hyperlipidemia   . Hypertension   . Gastroesophageal reflux disease   . Osteoarthritis   . Peptic ulcer disease   . Morbid obesity (HCC)     /sleep apnea  . Allergic rhinitis     plus h/o asthma  . Tobacco abuse, in remission     Cigarettes discontinued in 2010  . CHF (congestive heart failure) (HCC)   . A-fib (HCC)     2015  . Asthma    Past Surgical History  Procedure Laterality Date  . None     Family History  Problem Relation Age of Onset  . Colon cancer Neg Hx   . Inflammatory bowel disease Neg Hx   . Liver disease Neg Hx    Social History  Substance  Use Topics  . Smoking status: Former Smoker -- 0.50 packs/day for 20 years    Types: Cigarettes    Start date: 04/26/1988    Quit date: 06/23/2007  . Smokeless tobacco: Never Used     Comment: 1 ppd former smoker  . Alcohol Use: No     Comment: no etoh since 2009    Review of Systems  Respiratory: Positive for cough and shortness of breath. Negative for stridor.   All other systems reviewed and are negative.     Allergies  Bee venom; Aspirin; and Banana  Home Medications   Prior to Admission medications   Medication Sig Start Date End Date Taking? Authorizing Provider  beclomethasone (QVAR) 80 MCG/ACT inhaler Inhale 2 puffs into the lungs daily.    Yes Historical Provider, MD  Calcium Carbonate Antacid (ALKA-SELTZER ANTACID PO) Take 1 tablet by mouth as needed (upset stomach).   Yes Historical Provider, MD  cloNIDine (CATAPRES) 0.2 MG tablet Take 1 tablet (0.2 mg total) by mouth 2 (two) times daily. 05/01/15  Yes [name], MD  diltiazem (CARDIZEM CD) 240 MG 24 hr capsule TAKE 1 CAPSULE EVERY MORNING 05/27/15  Yes [name], MD  furosemide (LASIX) 20 MG tablet Take 2 tablets (40 mg total) by mouth daily. 05/29/15  Yes Starleen Arms, MD  metoprolol succinate (TOPROL-XL)  50 MG 24 hr tablet Take 50 mg by mouth every other day. Take with or immediately following a meal.   Yes Historical Provider, MD  omeprazole (PRILOSEC) 20 MG capsule Take 1 capsule (20 mg total) by mouth daily. Patient not taking: Reported on 05/13/2015 04/02/15   Erick Blinks, MD  potassium chloride SA (K-DUR,KLOR-CON) 20 MEQ tablet Take 1 tablet (20 mEq total) by mouth daily. Patient not taking: Reported on 05/28/2015 05/13/15   Cathren Laine, MD  rivaroxaban (XARELTO) 20 MG TABS tablet Take 1 tablet (20 mg total) by mouth daily with supper. Patient not taking: Reported on 05/15/2015 04/02/15   Erick Blinks, MD   BP 169/126 mmHg  Pulse 105  Temp(Src) 98.7 F (37.1 C) (Oral)  Resp 24   Ht 6' (1.829 m)  Wt 149.687 kg  BMI 44.75 kg/m2  SpO2 96% Physical Exam  Constitutional: He is oriented to person, place, and time. He appears well-developed and well-nourished.  Non-toxic appearance.  HENT:  Head: Normocephalic.  Right Ear: Tympanic membrane and external ear normal.  Left Ear: Tympanic membrane and external ear normal.  Eyes: EOM and lids are normal. Pupils are equal, round, and reactive to light.  Neck: Normal range of motion. Neck supple. Carotid bruit is not present.  Cardiovascular: Normal heart sounds, intact distal pulses and normal pulses.  An irregularly irregular rhythm present. Tachycardia present.   No rub or gallop appreciated. There is an irregular irregularity present.  Pulmonary/Chest: Tachypnea noted. No respiratory distress. He has rhonchi. He has rales.  Abdominal: Soft. Bowel sounds are normal. He exhibits no shifting dullness and no ascites. There is no tenderness. There is no guarding.  Musculoskeletal: Normal range of motion.  Multiple varicosities of present of the lower extremities. There is some puffiness of the lower extremities. No pitting edema noted. There is a raised area of the right lower extremity that is cyst like consistency. It is nontender and nonpulsatile. The patient states this is been there for several years.  Lymphadenopathy:       Head (right side): No submandibular adenopathy present.       Head (left side): No submandibular adenopathy present.    He has no cervical adenopathy.  Neurological: He is alert and oriented to person, place, and time. He has normal strength. No cranial nerve deficit or sensory deficit.  Skin: Skin is warm and dry.  Psychiatric: He has a normal mood and affect. His speech is normal.  Nursing note and vitals reviewed.   ED Course  Procedures (including critical care time) Labs Review Labs Reviewed  CBC WITH DIFFERENTIAL/PLATELET - Abnormal; Notable for the following:    Hemoglobin 12.2 (*)    HCT  36.6 (*)    All other components within normal limits  COMPREHENSIVE METABOLIC PANEL  BRAIN NATRIURETIC PEPTIDE  TROPONIN I  URINALYSIS, ROUTINE W REFLEX MICROSCOPIC (NOT AT [facility])    Imaging Review Dg Chest 2 View  06/13/2015  CLINICAL DATA:  Productive cough, congestion and shortness of breath for 1 month. EXAM: CHEST  2 VIEW COMPARISON:  05/28/2015 FINDINGS: Cardiomegaly. Bilateral perihilar and lower lobe opacities, improving since prior study. Persistent opacities, right greater than left. No effusions. No acute bony abnormality. IMPRESSION: Improvement in bilateral perihilar and lower lobe opacities with continued opacities, right greater than left. This could reflect improving edema or infection. Stable cardiomegaly. Electronically Signed   By: Charlett Nose M.D.   On: 06/13/2015 09:58   I have personally reviewed and evaluated these images  and lab results as part of my medical decision-making.   EKG Interpretation None      MDM  Vital signs reviewed.  Previous records and discharge summary reviewed. Urine analysis nonacute. The compressive metabolic panel shows potassium to be slightly low at 3, the total bilirubin is high at 2.0, no other findings at this time. Complete blood count nonacute. The B natruretic peptide is elevated at 531. The troponin is elevated at 2.73. The electrocardiogram shows an atrial fibrillation with a ventricular response of 108. the EKG is mostly unchanged from previous electrocardiogram. Chest x-ray shows improvement in the bilateral perihilar and left lower opacities, but there remains right greater than left opacities present.  Patient seen with me by Dr. Ranae Palms.  Case discussed with Dr. Tenny Craw (cardiology). Case discussed with [facility]. [facility] will be transferred [facility]. Second electrocardiogram ordered.    Final diagnoses:  None    *I have reviewed nursing notes, vital signs, and all appropriate lab and imaging  results for this patient.[address], PA-C 06/13/15 1314  Loren Racer, MD 06/15/15 [phone]

## 2015-06-13 NOTE — ED Notes (Signed)
CRITICAL VALUE ALERT  Critical value received:  Troponin 2.73  Date of notification:  06/13/15  Time of notification:  1050  Critical value read back:Yes.    Nurse who received alert:  S Everlina Gotts RN  MD notified (1st page):  H. Beverely Pace PA  Time of first page:  1050  MD notified (2nd page):  Time of second page:  Responding MD:  Loney Laurence PA  Time MD responded:  1050

## 2015-06-13 NOTE — ED Notes (Signed)
[facility] lab about BNP, states BNP is still processing.

## 2015-06-13 NOTE — Progress Notes (Signed)
ANTICOAGULATION CONSULT NOTE - Initial Consult  Pharmacy Consult for Heparin Indication: ACS  Allergies  Allergen Reactions  . Bee Venom Shortness Of Breath and Swelling  . Aspirin Other (See Comments)    Chewable 81 mg tablets upsets patients stomach  . Banana Diarrhea   Patient Measurements: Height: 6' (182.9 cm) Weight: (!) 330 lb (149.687 kg) IBW/kg (Calculated) : 77.6 HEPARIN DW (KG): 112.8  Vital Signs: Temp: 98.7 F (37.1 C) (12/22 0909) Temp Source: Oral (12/22 0909) BP: 184/113 mmHg (12/22 1100) Pulse Rate: 84 (12/22 1100)  Labs:  Recent Labs  06/13/15 0958  HGB 12.2*  HCT 36.6*  PLT 223  CREATININE 1.05  TROPONINI 2.73*    Estimated Creatinine Clearance: 130.9 mL/min (by C-G formula based on Cr of 1.05).  Medical History: Past Medical History  Diagnosis Date  . Hypertensive heart disease     Hypertensive heart disease with prior episodes of CHF  . Hyperlipidemia   . Hypertension   . Gastroesophageal reflux disease   . Osteoarthritis   . Peptic ulcer disease   . Morbid obesity (HCC)     /sleep apnea  . Allergic rhinitis     plus h/o asthma  . Tobacco abuse, in remission     Cigarettes discontinued in 2010  . CHF (congestive heart failure) (HCC)   . A-fib (HCC)     2015  . Asthma    See PTA med list:  Note stating pt not taking Xarelto due to adverse effects  Assessment: 49yo obese male presents to ED.  Asked to initiate heparin for CP and elevated troponin, cardiology to see and evaluate.    Goal of Therapy:  Heparin level 0.3-0.7 units/ml Monitor platelets by anticoagulation protocol: Yes   Plan: Heparin 4000 units IV bolus now x 1 Heparin infusion at 2100 units/hr (weight based dosing protocol) Heparin level in 6-8 hrs then daily CBC daily while on Heparin  Margo Aye, [place] A 06/13/2015,11:23 AM

## 2015-06-13 NOTE — Progress Notes (Signed)
ANTICOAGULATION CONSULT NOTE  Pharmacy Consult for Heparin Indication: ACS  Allergies  Allergen Reactions  . Bee Venom Shortness Of Breath and Swelling  . Aspirin Other (See Comments)    Chewable 81 mg tablets upsets patients stomach  . Banana Diarrhea   Patient Measurements: Height: 6' (182.9 cm) Weight: (!) 309 lb 1.4 oz (140.2 kg) IBW/kg (Calculated) : 77.6 HEPARIN DW (KG): 110  Vital Signs: Temp: 98.2 F (36.8 C) (12/22 1900) Temp Source: Oral (12/22 1900) BP: 135/103 mmHg (12/22 2143) Pulse Rate: 87 (12/22 2048)  Labs:  Recent Labs  06/13/15 0958 06/13/15 1129 06/13/15 1758 06/13/15 2053  HGB 12.2*  --   --   --   HCT 36.6*  --   --   --   PLT 223  --   --   --   HEPARINUNFRC  --   --   --  0.31  CREATININE 1.05  --   --   --   TROPONINI 2.73* 2.58* 1.45*  --     Estimated Creatinine Clearance: 126.2 mL/min (by C-G formula based on Cr of 1.05).  Medical History: Past Medical History  Diagnosis Date  . Hypertensive heart disease     Hypertensive heart disease with prior episodes of CHF  . Hyperlipidemia   . Hypertension   . Gastroesophageal reflux disease   . Osteoarthritis   . Peptic ulcer disease   . Morbid obesity (HCC)     /sleep apnea  . Allergic rhinitis     plus h/o asthma  . Tobacco abuse, in remission     Cigarettes discontinued in 2010  . CHF (congestive heart failure) (HCC)   . A-fib (HCC)     2015  . Asthma    See PTA med list:  Note stating pt not taking Xarelto due to adverse effects  Assessment: 49yo obese male presents to ED.  Continuing on heparin for CP and elevated troponin, cardiology to see and evaluate.  Initial HL therapeutic (0.31) but at bottom of range on 2100 units/h. Will increase slightly to keep in range. Hg 12.2, plt wnl. No bleed or IV line issues per RN.  Goal of Therapy:  Heparin level 0.3-0.7 units/ml Monitor platelets by anticoagulation protocol: Yes   Plan: Increase heparin IV slightly to 2150 units/h  to keep in range 6h HL to confirm Daily HL/CBC Mon s/sx bleeding  Babs Bertin, PharmD, [facility] Clinical Pharmacist Pager [phone] 06/13/2015 10:29 PM

## 2015-06-13 NOTE — ED Notes (Signed)
PA notified of critical troponin of 2.73.

## 2015-06-14 ENCOUNTER — Encounter (HOSPITAL_COMMUNITY)
Admission: EM | Disposition: A | Discharge status: Home Health | Payer: Self-pay | Source: Home / Self Care | Attending: Internal Medicine

## 2015-06-14 ENCOUNTER — Encounter (HOSPITAL_COMMUNITY): Payer: Self-pay | Admitting: Cardiovascular Disease

## 2015-06-14 DIAGNOSIS — I5031 Acute diastolic (congestive) heart failure: Secondary | ICD-10-CM

## 2015-06-14 DIAGNOSIS — I4891 Unspecified atrial fibrillation: Secondary | ICD-10-CM

## 2015-06-14 HISTORY — PX: CARDIAC CATHETERIZATION: SHX172

## 2015-06-14 LAB — CBC
HCT: 36.9 % — ABNORMAL LOW (ref 39.0–52.0)
HEMOGLOBIN: 12.1 g/dL — AB (ref 13.0–17.0)
MCH: 28.8 pg (ref 26.0–34.0)
MCHC: 32.8 g/dL (ref 30.0–36.0)
MCV: 87.9 fL (ref 78.0–100.0)
PLATELETS: 220 10*3/uL (ref 150–400)
RBC: 4.2 MIL/uL — AB (ref 4.22–5.81)
RDW: 14 % (ref 11.5–15.5)
WBC: 3.2 10*3/uL — AB (ref 4.0–10.5)

## 2015-06-14 LAB — POCT ACTIVATED CLOTTING TIME: ACTIVATED CLOTTING TIME: 152 s

## 2015-06-14 LAB — POCT I-STAT 3, VENOUS BLOOD GAS (G3P V)
ACID-BASE EXCESS: 2 mmol/L (ref 0.0–2.0)
Bicarbonate: 26.6 mEq/L — ABNORMAL HIGH (ref 20.0–24.0)
O2 Saturation: 51 %
PH VEN: 7.424 — AB (ref 7.250–7.300)
TCO2: 28 mmol/L (ref 0–100)
pCO2, Ven: 40.7 mmHg — ABNORMAL LOW (ref 45.0–50.0)
pO2, Ven: 27 mmHg — CL (ref 30.0–45.0)

## 2015-06-14 LAB — LIPID PANEL
CHOL/HDL RATIO: 5 ratio
CHOLESTEROL: 139 mg/dL (ref 0–200)
HDL: 28 mg/dL — AB (ref >40)
LDL Cholesterol: 99 mg/dL (ref 0–99)
TRIGLYCERIDES: 58 mg/dL (ref <150)
VLDL: 12 mg/dL (ref 0–40)

## 2015-06-14 LAB — BASIC METABOLIC PANEL
ANION GAP: 6 (ref 5–15)
BUN: 11 mg/dL (ref 6–20)
CHLORIDE: 107 mmol/L (ref 101–111)
CO2: 29 mmol/L (ref 22–32)
Calcium: 8.5 mg/dL — ABNORMAL LOW (ref 8.9–10.3)
Creatinine, Ser: 1.14 mg/dL (ref 0.61–1.24)
GFR calc Af Amer: >60 mL/min (ref >60)
GFR calc non Af Amer: >60 mL/min (ref >60)
GLUCOSE: 83 mg/dL (ref 65–99)
POTASSIUM: 3.4 mmol/L — AB (ref 3.5–5.1)
SODIUM: 142 mmol/L (ref 135–145)

## 2015-06-14 LAB — POCT I-STAT 3, ART BLOOD GAS (G3+)
ACID-BASE EXCESS: 1 mmol/L (ref 0.0–2.0)
Bicarbonate: 24.9 mEq/L — ABNORMAL HIGH (ref 20.0–24.0)
O2 SAT: 94 %
PO2 ART: 69 mmHg — AB (ref 80.0–100.0)
TCO2: 26 mmol/L (ref 0–100)
pCO2 arterial: 36.5 mmHg (ref 35.0–45.0)
pH, Arterial: 7.443 (ref 7.350–7.450)

## 2015-06-14 LAB — PROTIME-INR
INR: 1.37 (ref 0.00–1.49)
Prothrombin Time: 16.9 seconds — ABNORMAL HIGH (ref 11.6–15.2)

## 2015-06-14 LAB — TROPONIN I
Troponin I: 1.22 ng/mL (ref <0.031)
Troponin I: 1.38 ng/mL (ref <0.031)

## 2015-06-14 LAB — HEPARIN LEVEL (UNFRACTIONATED): Heparin Unfractionated: 0.22 IU/mL — ABNORMAL LOW (ref 0.30–0.70)

## 2015-06-14 SURGERY — RIGHT/LEFT HEART CATH AND CORONARY ANGIOGRAPHY
Anesthesia: LOCAL

## 2015-06-14 MED ORDER — IOHEXOL 350 MG/ML SOLN
INTRAVENOUS | Status: DC | PRN
  Administered 2015-06-14: 55 mL via INTRA_ARTERIAL

## 2015-06-14 MED ORDER — APIXABAN 5 MG PO TABS
5.0000 mg | ORAL_TABLET | Freq: Two times a day (BID) | ORAL | Status: DC
  Administered 2015-06-14 – 2015-06-17 (×6): 5 mg via ORAL
  Filled 2015-06-14 (×6): qty 1

## 2015-06-14 MED ORDER — PANTOPRAZOLE SODIUM 40 MG PO TBEC
40.0000 mg | DELAYED_RELEASE_TABLET | Freq: Every day | ORAL | Status: DC
  Administered 2015-06-15 – 2015-06-17 (×3): 40 mg via ORAL
  Filled 2015-06-14 (×3): qty 1

## 2015-06-14 MED ORDER — SODIUM CHLORIDE 0.9 % WEIGHT BASED INFUSION: 1.0000 mL/kg/h | INTRAVENOUS | Status: DC

## 2015-06-14 MED ORDER — ACETAMINOPHEN 325 MG PO TABS: 650.0000 mg | ORAL_TABLET | ORAL | Status: DC | PRN

## 2015-06-14 MED ORDER — LIDOCAINE HCL (PF) 1 % IJ SOLN
INTRAMUSCULAR | Status: AC
  Filled 2015-06-14: qty 30

## 2015-06-14 MED ORDER — NITROGLYCERIN IN D5W 200-5 MCG/ML-% IV SOLN
INTRAVENOUS | Status: AC
  Filled 2015-06-14: qty 250

## 2015-06-14 MED ORDER — POTASSIUM CHLORIDE CRYS ER 20 MEQ PO TBCR
40.0000 meq | EXTENDED_RELEASE_TABLET | Freq: Once | ORAL | Status: AC
  Administered 2015-06-14: 40 meq via ORAL
  Filled 2015-06-14: qty 2

## 2015-06-14 MED ORDER — LIDOCAINE HCL (PF) 1 % IJ SOLN
INTRAMUSCULAR | Status: DC | PRN
  Administered 2015-06-14: 08:00:00

## 2015-06-14 MED ORDER — NITROGLYCERIN IN D5W 200-5 MCG/ML-% IV SOLN
INTRAVENOUS | Status: DC | PRN
  Administered 2015-06-14: 10 ug/min via INTRAVENOUS

## 2015-06-14 MED ORDER — SODIUM CHLORIDE 0.9 % IJ SOLN: 3.0000 mL | INTRAMUSCULAR | Status: DC | PRN

## 2015-06-14 MED ORDER — ONDANSETRON HCL 4 MG/2ML IJ SOLN: 4.0000 mg | Freq: Four times a day (QID) | INTRAMUSCULAR | Status: DC | PRN

## 2015-06-14 MED ORDER — HYDRALAZINE HCL 20 MG/ML IJ SOLN: 10.0000 mg | INTRAMUSCULAR | Status: DC | PRN

## 2015-06-14 MED ORDER — HYDRALAZINE HCL 20 MG/ML IJ SOLN
INTRAMUSCULAR | Status: DC | PRN
  Administered 2015-06-14: 10 mg via INTRAVENOUS

## 2015-06-14 MED ORDER — FUROSEMIDE 10 MG/ML IJ SOLN
40.0000 mg | Freq: Once | INTRAMUSCULAR | Status: AC
  Administered 2015-06-14: 40 mg via INTRAVENOUS

## 2015-06-14 MED ORDER — MORPHINE SULFATE (PF) 2 MG/ML IV SOLN: 2.0000 mg | INTRAVENOUS | Status: DC | PRN

## 2015-06-14 MED ORDER — HEPARIN (PORCINE) IN NACL 2-0.9 UNIT/ML-% IJ SOLN
INTRAMUSCULAR | Status: AC
  Filled 2015-06-14: qty 1500

## 2015-06-14 MED ORDER — HEPARIN BOLUS VIA INFUSION
2000.0000 [IU] | Freq: Once | INTRAVENOUS | Status: AC
  Administered 2015-06-14: 2000 [IU] via INTRAVENOUS
  Filled 2015-06-14: qty 2000

## 2015-06-14 MED ORDER — FUROSEMIDE 10 MG/ML IJ SOLN
INTRAMUSCULAR | Status: AC
  Filled 2015-06-14: qty 4

## 2015-06-14 MED ORDER — ASPIRIN EC 81 MG PO TBEC
81.0000 mg | DELAYED_RELEASE_TABLET | Freq: Every day | ORAL | Status: DC
  Administered 2015-06-15 – 2015-06-17 (×3): 81 mg via ORAL
  Filled 2015-06-14 (×3): qty 1

## 2015-06-14 MED ORDER — SODIUM CHLORIDE 0.9 % IV SOLN: 250.0000 mL | INTRAVENOUS | Status: DC | PRN

## 2015-06-14 MED ORDER — HYDRALAZINE HCL 20 MG/ML IJ SOLN
INTRAMUSCULAR | Status: AC
  Filled 2015-06-14: qty 1

## 2015-06-14 MED ORDER — SPIRONOLACTONE 25 MG PO TABS
12.5000 mg | ORAL_TABLET | Freq: Every day | ORAL | Status: DC
  Administered 2015-06-14 – 2015-06-15 (×2): 12.5 mg via ORAL
  Filled 2015-06-14 (×2): qty 1

## 2015-06-14 MED ORDER — SODIUM CHLORIDE 0.9 % IJ SOLN
3.0000 mL | Freq: Two times a day (BID) | INTRAMUSCULAR | Status: DC
  Administered 2015-06-14 (×2): 3 mL via INTRAVENOUS

## 2015-06-14 SURGICAL SUPPLY — 15 items
CATH INFINITI 5FR MULTPACK ANG (CATHETERS) ×1 IMPLANT
CATH SWAN GANZ 7F STRAIGHT (CATHETERS) ×1 IMPLANT
DEVICE RAD COMP TR BAND LRG (VASCULAR PRODUCTS) ×2 IMPLANT
HOVERMATT SINGLE USE (MISCELLANEOUS) ×1 IMPLANT
KIT HEART LEFT (KITS) ×2 IMPLANT
KIT HEART RIGHT NAMIC (KITS) ×2 IMPLANT
PACK CARDIAC CATHETERIZATION (CUSTOM PROCEDURE TRAY) ×2 IMPLANT
SHEATH PINNACLE 5F 10CM (SHEATH) ×1 IMPLANT
SHEATH PINNACLE 7F 10CM (SHEATH) ×1 IMPLANT
SYR MEDRAD MARK V 150ML (SYRINGE) ×1 IMPLANT
TRANSDUCER W/STOPCOCK (MISCELLANEOUS) ×3 IMPLANT
WIRE EMERALD 3MM-J .025X260CM (WIRE) ×1 IMPLANT
WIRE EMERALD 3MM-J .035X150CM (WIRE) ×1 IMPLANT
WIRE HI TORQ VERSACORE-J 145CM (WIRE) ×2 IMPLANT
WIRE SAFE-T 1.5MM-J .035X260CM (WIRE) ×2 IMPLANT

## 2015-06-14 NOTE — Progress Notes (Signed)
ANTICOAGULATION CONSULT NOTE - Follow Up Consult  Pharmacy Consult for Heparin  Indication: chest pain/ACS  Allergies  Allergen Reactions  . Bee Venom Shortness Of Breath and Swelling  . Aspirin Other (See Comments)    Chewable 81 mg tablets upsets patients stomach  . Banana Diarrhea    Patient Measurements: Height: 6' (182.9 cm) Weight: (!) 306 lb 3.5 oz (138.9 kg) IBW/kg (Calculated) : 77.6  Vital Signs: Temp: 97.5 F (36.4 C) (12/23 0354) Temp Source: Oral (12/23 0354) BP: 156/120 mmHg (12/23 0354) Pulse Rate: 59 (12/23 0354)  Labs:  Recent Labs  06/13/15 0958  06/13/15 1758 06/13/15 2053 06/13/15 2326 06/14/15 0328  HGB 12.2*  --   --   --   --  12.1*  HCT 36.6*  --   --   --   --  36.9*  PLT 223  --   --   --   --  220  HEPARINUNFRC  --   --   --  0.31  --  0.22*  CREATININE 1.05  --   --   --   --  1.14  TROPONINI 2.73*  < > 1.45*  --  1.22* 1.38*  < > = values in this interval not displayed.  Estimated Creatinine Clearance: 115.7 mL/min (by C-G formula based on Cr of 1.14).  Assessment: Heparin for CP, troponin is elevated, going for cath this AM, no issues per RN.   Goal of Therapy:  Heparin level 0.3-0.7 units/ml Monitor platelets by anticoagulation protocol: Yes   Plan:  -Heparin 2000 units BOLUS -Increase heparin drip to 2350 units/hr -1300 HL or f/u post-cath, whichever is first -Daily CBC/HL -Monitor for bleeding  Abran Duke 06/14/2015,5:41 AM

## 2015-06-14 NOTE — Progress Notes (Signed)
Nutrition Brief Note  Patient identified on the Malnutrition Screening Tool (MST) Report  Per pt his weight fluctuates. He weighs about 330 lb but after hospital admission goes down to 300 lb. He feels that this is likely due to fluid fluctuations.  Eats 3 meals per day.  Encourage meals and snacks.   Wt Readings from Last 15 Encounters:  06/14/15 306 lb 3.5 oz (138.9 kg)  05/29/15 309 lb 4.9 oz (140.3 kg)  05/15/15 335 lb (151.955 kg)  05/13/15 330 lb (149.687 kg)  05/01/15 324 lb (146.965 kg)  04/01/15 311 lb 11.2 oz (141.386 kg)  12/17/14 314 lb 12.8 oz (142.792 kg)  08/28/14 311 lb (141.069 kg)  08/10/14 338 lb 3 oz (153.4 kg)  06/30/14 302 lb 7.5 oz (137.2 kg)  06/21/14 315 lb (142.883 kg)  06/21/14 317 lb 9.6 oz (144.062 kg)  05/25/14 324 lb (146.965 kg)  05/07/14 324 lb (146.965 kg)  04/04/14 352 lb 4.7 oz (159.8 kg)    Body mass index is 41.52 kg/(m^2). Patient meets criteria for morbid obesity based on current BMI.   Current diet order is Heart Healthy, just advanced. Labs and medications reviewed.   No nutrition interventions warranted at this time. If nutrition issues arise, please consult RD.   Kendell Bane RD, LDN, CNSC [phone] Pager [phone] After Hours Pager

## 2015-06-14 NOTE — Progress Notes (Signed)
TEAM 1 - Stepdown/ICU TEAM PROGRESS NOTE  [name] ZOX:[id number] DOB: Oct 13, 1965 DOA: 06/13/2015 PCP: [name]  Admit HPI / Brief Narrative: 49 y.o. male with hx of obesity, OSA, HTN, acute on chronic diastolic CHF, and HTN who presented to the ER with substernal CP. He was recently admitted for acute on chronic diastolic at [facility].   Work up in the ER showed afib with controlled rate, no acute ST/T changes, and his troponin was 2.73. His Cr was 1.05, K of 3.0, and normal WBC with Hb of 12 g per dL. Cardiology consultation was requested by EDP and Dr Dietrich Pates recommended for patient to be admitted to the Hospitalist service. Patient said ASA irritates his stomach. He was recommended previously to take Xarelto, but didn't due to upset stomach. No reported black or bloody stool.   HPI/Subjective: Resting comfortably in bed.  States he had some chest pain earlier today, but it has now resolved.  Denies current sob, n/v, or abdom pain.    Assessment/Plan:  Chest pain w/ positive troponin  Clean cath - possibly related to pulm edema/R heart failure - cont med tx for same   Chronic diastolic CHF - Pulm HTN EF 55% on TTE earlier this month - baseline wgt appears to be ~140kg - currently  139kg - exam suggests only mild overload presently - diuresis and follow   Chronic afib CHADSVASc is 2 - apixaban started per Cards - rate presently controlled   HTN Not yet at goal, but has hx of poor control so careful not to correct too rapidly - follow w/o change this evening   HLD  OHS  Morbid Obesity - Body mass index is 41.52 kg/(m^2).  Code Status: FULL Family Communication: no family present at time of exam Disposition Plan: SDU tonight - transfer in AM if stable over night   Consultants: [facility]   Procedures: 12/23 cardiac cath - normal coronaries   Antibiotics: none  DVT prophylaxis: apixaban  Objective: Blood pressure 148/119,  pulse 92, temperature 98 F (36.7 C), temperature source Oral, resp. rate 17, height 6' (1.829 m), weight 138.9 kg (306 lb 3.5 oz), SpO2 95 %.  Intake/Output Summary (Last 24 hours) at 06/14/15 1717 Last data filed at 06/14/15 1500  Gross per 24 hour  Intake 1256.25 ml  Output   4950 ml  Net -3693.75 ml   Exam: General: No acute respiratory distress Lungs: Clear to auscultation bilaterally without wheezes or crackles Cardiovascular: Regular rate without murmur gallop or rub normal S1 and S2 Abdomen: Nontender, nondistended, soft, bowel sounds positive, no rebound, no ascites, no appreciable mass Extremities: No significant cyanosis, or clubbing;  Trace edema bilateral lower extremities  Data Reviewed: Basic Metabolic Panel:  Recent Labs Lab 06/13/15 0958 06/14/15 0328  NA 141 142  K 3.0* 3.4*  CL 109 107  CO2 25 29  GLUCOSE 93 83  BUN 16 11  CREATININE 1.05 1.14  CALCIUM 8.6* 8.5*    CBC:  Recent Labs Lab 06/13/15 0958 06/14/15 0328  WBC 4.1 3.2*  NEUTROABS 2.7  --   HGB 12.2* 12.1*  HCT 36.6* 36.9*  MCV 86.3 87.9  PLT 223 220    Liver Function Tests:  Recent Labs Lab 06/13/15 0958  AST 37  ALT 24  ALKPHOS 49  BILITOT 2.0*  PROT 6.2*  ALBUMIN 3.4*    Coags:  Recent Labs Lab 06/14/15 0328  INR 1.37    Cardiac Enzymes:  Recent Labs Lab 06/13/15 0958 06/13/15 1129 06/13/15 1758 06/13/15 2326 06/14/15 0328  TROPONINI 2.73* 2.58* 1.45* 1.22* 1.38*    CBG:  Recent Labs Lab 06/13/15 1651  GLUCAP 76    Recent Results (from the past 240 hour(s))  MRSA PCR Screening     Status: None   Collection Time: 06/13/15  4:53 PM  Result Value Ref Range Status   MRSA by PCR NEGATIVE NEGATIVE Final    Comment:        The GeneXpert MRSA Assay (FDA approved for NASAL specimens only), is one component of a comprehensive MRSA colonization surveillance program. It is not intended to diagnose MRSA infection nor to guide or monitor treatment  for MRSA infections.      Studies:   Recent x-ray studies have been reviewed in detail by the Attending Physician  Scheduled Meds:  Scheduled Meds: . apixaban  5 mg Oral BID  . [START ON 06/15/2015] aspirin EC  81 mg Oral Daily  . atorvastatin  80 mg Oral q1800  . budesonide (PULMICORT) nebulizer solution  0.5 mg Nebulization Daily  . cloNIDine  0.3 mg Oral BID  . diltiazem  240 mg Oral q morning - 10a  . furosemide  40 mg Intravenous Q12H  . metoprolol succinate  50 mg Oral Daily  . nitroGLYCERIN  0.4 mg Sublingual Once  . [START ON 06/15/2015] pantoprazole  40 mg Oral Daily  . sodium chloride  3 mL Intravenous Q12H  . spironolactone  12.5 mg Oral Daily    Time spent on care of this patient: 35 mins   MCCLUNG,JEFFREY T , MD   [facility] Office  [phone] Pager - Text Page per Loretha Stapler as per below:  On-Call/Text Page:      [email or web address]      password [email or web address]  If 7PM-7AM, please contact night-coverage [email or web address] Password [email or web address] 06/14/2015, 5:17 PM   LOS: 1 day

## 2015-06-14 NOTE — Progress Notes (Signed)
Subjective: Complains of allergy to potassium  Objective: Vital signs in last 24 hours: Temp:  [97.5 F (36.4 C)-98.5 F (36.9 C)] 98.5 F (36.9 C) (12/23 1214) Pulse Rate:  [47-115] 60 (12/23 1214) Resp:  [11-53] 16 (12/23 1214) BP: (115-171)/(66-120) 116/92 mmHg (12/23 1214) SpO2:  [86 %-100 %] 98 % (12/23 1214) Weight:  [306 lb 3.5 oz (138.9 kg)-309 lb 1.4 oz (140.2 kg)] 306 lb 3.5 oz (138.9 kg) (12/23 0354) Weight change:  Last BM Date: 06/13/15 Intake/Output from previous day: 12/22 0701 - 12/23 0700 In: 312.3 [I.V.:312.3] Out: 4375 [Urine:4375] Intake/Output this shift: Total I/O In: 326.5 [P.O.:150; I.V.:176.5] Out: 1400 [Urine:1400]  PE: General:Pleasant affect- but not feel well, anxious, NAD Skin:Warm and dry, brisk capillary refill HEENT:normocephalic, sclera clear, mucus membranes moist Heart:irreg irreg without murmur, gallup, rub or click Lungs:clear ant but muffled- without rales, rhonchi, or wheezes HGD:JMEQ, non tender, + BS, do not palpate liver spleen or masses Ext:no to tr lower ext edema, 2+ pedal pulses, 2+ radial pulses Neuro:alert and oriented X 3, MAE, follows commands, + facial symmetry Tele: a fib mildly elevated   Lab Results:  Recent Labs  06/13/15 0958 06/14/15 0328  WBC 4.1 3.2*  HGB 12.2* 12.1*  HCT 36.6* 36.9*  PLT 223 220   BMET  Recent Labs  06/13/15 0958 06/14/15 0328  NA 141 142  K 3.0* 3.4*  CL 109 107  CO2 25 29  GLUCOSE 93 83  BUN 16 11  CREATININE 1.05 1.14  CALCIUM 8.6* 8.5*    Recent Labs  06/13/15 2326 06/14/15 0328  TROPONINI 1.22* 1.38*    Lab Results  Component Value Date   CHOL 139 06/14/2015   HDL 28* 06/14/2015   LDLCALC 99 06/14/2015   TRIG 58 06/14/2015   CHOLHDL 5.0 06/14/2015   Lab Results  Component Value Date   HGBA1C 5.8* 12/07/2014     Lab Results  Component Value Date   TSH 0.322* 04/02/2014    Hepatic Function Panel  Recent Labs  06/13/15 0958  PROT  6.2*  ALBUMIN 3.4*  AST 37  ALT 24  ALKPHOS 49  BILITOT 2.0*    Recent Labs  06/14/15 0328  CHOL 139   No results for input(s): PROTIME in the last 72 hours.     Studies/Results: Dg Chest 2 View  06/13/2015  CLINICAL DATA:  Productive cough, congestion and shortness of breath for 1 month. EXAM: CHEST  2 VIEW COMPARISON:  05/28/2015 FINDINGS: Cardiomegaly. Bilateral perihilar and lower lobe opacities, improving since prior study. Persistent opacities, right greater than left. No effusions. No acute bony abnormality. IMPRESSION: Improvement in bilateral perihilar and lower lobe opacities with continued opacities, right greater than left. This could reflect improving edema or infection. Stable cardiomegaly. Electronically Signed   By: Charlett Nose M.D.   On: 06/13/2015 09:58   RT Heart CATH:   Right Heart Pressures Elevated LV EDP consistent with volume overload. Right atrial pressure-15/16, mean equals 14 Right ventricular pressure-67/4 Pulmonary artery pressure-75/30, mean equals 50 Pulmonary capillary wedge pressure-equals 27, V-wave equals 42, mean equals 33 LVEDP was 22      Medications: I have reviewed the patient's current medications. Scheduled Meds: . aspirin EC  325 mg Oral Daily  . atorvastatin  80 mg Oral q1800  . budesonide (PULMICORT) nebulizer solution  0.5 mg Nebulization Daily  . cloNIDine  0.3 mg Oral BID  . diltiazem  240 mg Oral q morning -  10a  . furosemide  40 mg Intravenous Q12H  . metoprolol succinate  50 mg Oral Daily  . nitroGLYCERIN  0.4 mg Sublingual Once  . [START ON 06/15/2015] pantoprazole  40 mg Oral Daily  . sodium chloride  3 mL Intravenous Q12H  . spironolactone  12.5 mg Oral Daily   Continuous Infusions: . sodium chloride 1.008 mL/kg/hr (06/14/15 1115)   PRN Meds:.sodium chloride, acetaminophen, morphine injection, morphine injection, ondansetron **OR** ondansetron (ZOFRAN) IV, sodium chloride  Assessment/Plan: 49 yo with severe  HTN, diastolic CHF now with chest tightness/SOB with exertion x 1 wk On arrival to ER evid of CHF Clinically improved with 1 dose lasix No tightness now. EKg with out acute changes But, trop is signif elevated.   1 R and L heart cath done to define anatomy and filling pressures  --normal coronary arteries --elevated LVEDP , pulmonary HTN  He has diastolic heart failure and hypertensive heart disease --elevated troponin due to pulmonary HTN   2 Atrial fib Permanent Continue rate control and no anticoagulation for now. CHADSVASc of 2 so far   3. HTN Poorly controlled on admit now improved 130-116/92   Titrate meds Increased clonidine Metoprolol daily not qod Diurese and follow  --4062  -24 lbs since yesterday Vs. 3 lbs. ? 2 different wts in for yesterday  4 Acute on chronic diastolic CHF Volume status up Received IV lasix elevated LVEDP    --4062 since admit on lasix 40 IV bid --echo 05/28/15 55-60%  5. Lipds Eval LDL 99, HDL 28, T chol 139 TG 58   6. Hypokalemia add spironolactone     LOS: 1 day   Time spent with pt. :15 minutes. Nada Boozer  Nurse Practitioner Certified Pager [phone] or after 5pm and on weekends call [phone] 06/14/2015, 12:55 PM I agree with above assessment and plan.  He states that he has difficulty taking potassium.  He is on Lasix.  We will add spironolactone to help with potassium balance and with diuresis and blood pressure.  The patient states that he is now willing to take anticoagulation for his atrial fibrillation.  Groin is stable.  We will start Apixaban this evening.

## 2015-06-14 NOTE — Interval H&P Note (Signed)
Cath Lab Visit (complete for each Cath Lab visit)  Clinical Evaluation Leading to the Procedure:   ACS: Yes.    Non-ACS:    Anginal Classification: CCS III  Anti-ischemic medical therapy: No Therapy  Non-Invasive Test Results: No non-invasive testing performed  Prior CABG: No previous CABG      History and Physical Interval Note:  06/14/2015 7:43 AM  [name]  has presented today for surgery, with the diagnosis of cp  The various methods of treatment have been discussed with the patient and family. After consideration of risks, benefits and other options for treatment, the patient has consented to  Procedure(s): Right/Left Heart Cath and Coronary Angiography (N/A) as a surgical intervention .  The patient's history has been reviewed, patient examined, no change in status, stable for surgery.  I have reviewed the patient's chart and labs.  Questions were answered to the patient's satisfaction.     Nanetta Batty

## 2015-06-14 NOTE — H&P (View-Only) (Signed)
Primary Physician: Primary Cardiologist:  Purvis Sheffield   HPI: Pt is a 49 yo who presents to ER for eval of CP Pt has no known CAD  Followed in cardiology clinic  Seen on 05/01/15.  Hx of severe HTN, chronic diastolic CHF, NSVT and Afib.  Has not been taking anticoagulation  Fearful    He presents to ER with a couple  wk history of SOB and chst tightness with activity  Walking across house pt gets tightness and SOB  Some pink sputom production.  Feels better now that he has had a dose of lasix  Denies f/c  No palpitations    Discharged on 12/7 after admission for diastolic CHF  Echo on that admit showed severe LVH  LVEf 55%  Severe LAE and mod RAE.  RVEF mildly reduced           Past Medical History  Diagnosis Date  . Hypertensive heart disease     Hypertensive heart disease with prior episodes of CHF  . Hyperlipidemia   . Hypertension   . Gastroesophageal reflux disease   . Osteoarthritis   . Peptic ulcer disease   . Morbid obesity (HCC)     /sleep apnea  . Allergic rhinitis     plus h/o asthma  . Tobacco abuse, in remission     Cigarettes discontinued in 2010  . CHF (congestive heart failure) (HCC)   . A-fib (HCC)     2015  . Asthma     Medications Prior to Admission  Medication Sig Dispense Refill  . beclomethasone (QVAR) 80 MCG/ACT inhaler Inhale 2 puffs into the lungs daily.     . Calcium Carbonate Antacid (ALKA-SELTZER ANTACID PO) Take 1 tablet by mouth as needed (upset stomach).    . cloNIDine (CATAPRES) 0.2 MG tablet Take 1 tablet (0.2 mg total) by mouth 2 (two) times daily. 60 tablet 6  . diltiazem (CARDIZEM CD) 240 MG 24 hr capsule TAKE 1 CAPSULE EVERY MORNING 90 capsule 3  . furosemide (LASIX) 20 MG tablet Take 2 tablets (40 mg total) by mouth daily. 30 tablet 0  . metoprolol succinate (TOPROL-XL) 50 MG 24 hr tablet Take 50 mg by mouth every other day. Take with or immediately following a meal.    . omeprazole (PRILOSEC) 20 MG capsule Take 1 capsule (20 mg  total) by mouth daily. (Patient not taking: Reported on 05/13/2015) 30 capsule 11  . potassium chloride SA (K-DUR,KLOR-CON) 20 MEQ tablet Take 1 tablet (20 mEq total) by mouth daily. (Patient not taking: Reported on 05/28/2015) 10 tablet 0  . rivaroxaban (XARELTO) 20 MG TABS tablet Take 1 tablet (20 mg total) by mouth daily with supper. (Patient not taking: Reported on 05/15/2015) 30 tablet      . [START ON 06/14/2015] aspirin EC  325 mg Oral Daily  . atorvastatin  80 mg Oral q1800  . [START ON 06/14/2015] budesonide (PULMICORT) nebulizer solution  0.5 mg Nebulization Daily  . cloNIDine  0.2 mg Oral BID  . [START ON 06/14/2015] diltiazem  240 mg Oral q morning - 10a  . furosemide  40 mg Intravenous Q12H  . [START ON 06/14/2015] metoprolol succinate  50 mg Oral QODAY  . nitroGLYCERIN  0.4 mg Sublingual Once  . pantoprazole (PROTONIX) IV  40 mg Intravenous Q12H  . potassium chloride  40 mEq Oral Once  . sodium chloride  3 mL Intravenous Q12H    Infusions: . heparin 2,100 Units (06/13/15 1700)  . nitroGLYCERIN 5 mcg/min (  06/13/15 1700)    Allergies  Allergen Reactions  . Bee Venom Shortness Of Breath and Swelling  . Aspirin Other (See Comments)    Chewable 81 mg tablets upsets patients stomach  . Banana Diarrhea    Social History   Social History  . Marital Status: Divorced    Spouse Name: N/A  . Number of Children: 3  . Years of Education: N/A   Occupational History  . disabled    Social History Main Topics  . Smoking status: Former Smoker -- 0.50 packs/day for 20 years    Types: Cigarettes    Start date: 04/26/1988    Quit date: 06/23/2007  . Smokeless tobacco: Never Used     Comment: 1 ppd former smoker  . Alcohol Use: No     Comment: no etoh since 2009  . Drug Use: No  . Sexual Activity: No   Other Topics Concern  . Not on file   Social History Narrative    Family History  Problem Relation Age of Onset  . Colon cancer Neg Hx   . Inflammatory bowel  disease Neg Hx   . Liver disease Neg Hx     REVIEW OF SYSTEMS:  All systems reviewed  Negative to the above problem except as noted above.    PHYSICAL EXAM: Filed Vitals:   06/13/15 1552 06/13/15 1650  BP: 164/107 159/112  Pulse: 88   Temp: 98.5 F (36.9 C) 98.2 F (36.8 C)  Resp: 28      Intake/Output Summary (Last 24 hours) at 06/13/15 1721 Last data filed at 06/13/15 1700  Gross per 24 hour  Intake   22.5 ml  Output   2025 ml  Net -2002.5 ml    General: Morbidly obese 49 yo in NAD   HEENT: normal Neck: Neck full  Difficult to assess JVP   Carotids 2+ bilat; no bruits. No lymphadenopathy or thryomegaly appreciated. Cor: PMI nondisplaced. Irregular rate & rhythm. No rubs, gallops or murmurs. Lungs: clear to ausculation   Abdomen:  Distended   soft, nontender, nondistended. No hepatosplenomegaly. No bruits or masses. Good bowel sounds. Extremities: no cyanosis, clubbing, rash, edema  L ankle with soft tissue mass (Pt has had for years) Neuro: alert & oriented x 3, moves all 4 extremities w/o difficulty. Affect pleasant.  Deferred further exam as about to tx to [facility]    ECG:  Atrial fibrillation  104 bpm  Sl ST depression in inferior and lateral leads  (ST changes sl more prominent than previous EKG)  Results for orders placed or performed during the hospital encounter of 06/13/15 (from the past 24 hour(s))  Comprehensive metabolic panel     Status: Abnormal   Collection Time: 06/13/15  9:58 AM  Result Value Ref Range   Sodium 141 135 - 145 mmol/L   Potassium 3.0 (L) 3.5 - 5.1 mmol/L   Chloride 109 101 - 111 mmol/L   CO2 25 22 - 32 mmol/L   Glucose, Bld 93 65 - 99 mg/dL   BUN 16 6 - 20 mg/dL   Creatinine, Ser 0.98 0.61 - 1.24 mg/dL   Calcium 8.6 (L) 8.9 - 10.3 mg/dL   Total Protein 6.2 (L) 6.5 - 8.1 g/dL   Albumin 3.4 (L) 3.5 - 5.0 g/dL   AST 37 15 - 41 U/L   ALT 24 17 - 63 U/L   Alkaline Phosphatase 49 38 - 126 U/L   Total Bilirubin 2.0 (H) 0.3 - 1.2 mg/dL  GFR calc non Af Amer >60 >60 mL/min   GFR calc Af Amer >60 >60 mL/min   Anion gap 7 5 - 15  CBC with Differential     Status: Abnormal   Collection Time: 06/13/15  9:58 AM  Result Value Ref Range   WBC 4.1 4.0 - 10.5 K/uL   RBC 4.24 4.22 - 5.81 MIL/uL   Hemoglobin 12.2 (L) 13.0 - 17.0 g/dL   HCT 42.5 (L) 95.6 - 38.7 %   MCV 86.3 78.0 - 100.0 fL   MCH 28.8 26.0 - 34.0 pg   MCHC 33.3 30.0 - 36.0 g/dL   RDW 56.4 33.2 - 95.1 %   Platelets 223 150 - 400 K/uL   Neutrophils Relative % 65 %   Neutro Abs 2.7 1.7 - 7.7 K/uL   Lymphocytes Relative 26 %   Lymphs Abs 1.0 0.7 - 4.0 K/uL   Monocytes Relative 7 %   Monocytes Absolute 0.3 0.1 - 1.0 K/uL   Eosinophils Relative 1 %   Eosinophils Absolute 0.1 0.0 - 0.7 K/uL   Basophils Relative 1 %   Basophils Absolute 0.0 0.0 - 0.1 K/uL  Brain natriuretic peptide     Status: Abnormal   Collection Time: 06/13/15  9:58 AM  Result Value Ref Range   B Natriuretic Peptide 531.0 (H) 0.0 - 100.0 pg/mL  Troponin I     Status: Abnormal   Collection Time: 06/13/15  9:58 AM  Result Value Ref Range   Troponin I 2.73 (HH) <0.031 ng/mL  Urinalysis, Routine w reflex microscopic     Status: Abnormal   Collection Time: 06/13/15 11:19 AM  Result Value Ref Range   Color, Urine YELLOW YELLOW   APPearance CLEAR CLEAR   Specific Gravity, Urine 1.020 1.005 - 1.030   pH 5.5 5.0 - 8.0   Glucose, UA NEGATIVE NEGATIVE mg/dL   Hgb urine dipstick NEGATIVE NEGATIVE   Bilirubin Urine SMALL (A) NEGATIVE   Ketones, ur NEGATIVE NEGATIVE mg/dL   Protein, ur TRACE (A) NEGATIVE mg/dL   Nitrite NEGATIVE NEGATIVE   Leukocytes, UA NEGATIVE NEGATIVE  Urine microscopic-add on     Status: None   Collection Time: 06/13/15 11:19 AM  Result Value Ref Range   Squamous Epithelial / LPF NONE SEEN NONE SEEN   WBC, UA NONE SEEN 0 - 5 WBC/hpf   RBC / HPF 0-5 0 - 5 RBC/hpf   Bacteria, UA NONE SEEN NONE SEEN  Troponin I     Status: Abnormal   Collection Time: 06/13/15 11:29 AM   Result Value Ref Range   Troponin I 2.58 (HH) <0.031 ng/mL   Dg Chest 2 View  06/13/2015  CLINICAL DATA:  Productive cough, congestion and shortness of breath for 1 month. EXAM: CHEST  2 VIEW COMPARISON:  05/28/2015 FINDINGS: Cardiomegaly. Bilateral perihilar and lower lobe opacities, improving since prior study. Persistent opacities, right greater than left. No effusions. No acute bony abnormality. IMPRESSION: Improvement in bilateral perihilar and lower lobe opacities with continued opacities, right greater than left. This could reflect improving edema or infection. Stable cardiomegaly. Electronically Signed   By: Charlett Nose M.D.   On: 06/13/2015 09:58     ASSESSMENT: 49 yo with severe HTN, diastolic CHF now with chest tightness/SOB with exertion x 1 wk  On arrival to ER evid of CHF  Clinically improved with 1 dose lasix  No tightness now.  EKg with out acute changes  But, trop is signif elevated.   I would recomm  R and  L heart cath to define anatomy and filling pressures  Discussed with pt who agrees to proceed Heparin for now    2  Atrial fib  Continue rate control and no anticoagulation for now.  CHADSVASc of 2 so far    3.  HTN  Poorly controlled  Titrate meds  Increase clonidine  Metoprolol daily not qod  Diurese and follow    4  Acute on chronic diastolic CHF  Volume status up  Received IV lasix  Recomm R heart cath to define pressures   5.  Lipds  Eval in AM

## 2015-06-14 NOTE — Progress Notes (Signed)
Site area: Right groin a 5 french arterial and 7 french venous sheath was removed  Site Prior to Removal:  Level 0  Pressure Applied For 15 MINUTES    Minutes Beginning at 0850a  Manual:   Yes.    Patient Status During Pull:  stable  Post Pull Groin Site:  Level 0  Post Pull Instructions Given:  Yes.    Post Pull Pulses Present:  Yes.    Dressing Applied:  Yes.    Comments:  VS remain stable during sheath pull

## 2015-06-15 DIAGNOSIS — I272 Other secondary pulmonary hypertension: Secondary | ICD-10-CM

## 2015-06-15 DIAGNOSIS — I10 Essential (primary) hypertension: Secondary | ICD-10-CM

## 2015-06-15 LAB — BASIC METABOLIC PANEL
ANION GAP: 10 (ref 5–15)
BUN: 12 mg/dL (ref 6–20)
CO2: 27 mmol/L (ref 22–32)
Calcium: 8.8 mg/dL — ABNORMAL LOW (ref 8.9–10.3)
Chloride: 104 mmol/L (ref 101–111)
Creatinine, Ser: 1.14 mg/dL (ref 0.61–1.24)
GFR calc Af Amer: >60 mL/min (ref >60)
GLUCOSE: 79 mg/dL (ref 65–99)
POTASSIUM: 3.6 mmol/L (ref 3.5–5.1)
Sodium: 141 mmol/L (ref 135–145)

## 2015-06-15 LAB — CBC
HEMATOCRIT: 39.1 % (ref 39.0–52.0)
Hemoglobin: 12.6 g/dL — ABNORMAL LOW (ref 13.0–17.0)
MCH: 28.2 pg (ref 26.0–34.0)
MCHC: 32.2 g/dL (ref 30.0–36.0)
MCV: 87.5 fL (ref 78.0–100.0)
PLATELETS: 212 10*3/uL (ref 150–400)
RBC: 4.47 MIL/uL (ref 4.22–5.81)
RDW: 13.9 % (ref 11.5–15.5)
WBC: 3.4 10*3/uL — AB (ref 4.0–10.5)

## 2015-06-15 MED ORDER — LOSARTAN POTASSIUM 50 MG PO TABS
50.0000 mg | ORAL_TABLET | Freq: Every day | ORAL | Status: DC
Start: 2015-06-15 — End: 2015-06-17
  Administered 2015-06-15 – 2015-06-17 (×3): 50 mg via ORAL
  Filled 2015-06-15 (×3): qty 1

## 2015-06-15 MED ORDER — MORPHINE SULFATE (PF) 2 MG/ML IV SOLN: 1.0000 mg | INTRAVENOUS | Status: DC | PRN

## 2015-06-15 MED ORDER — SPIRONOLACTONE 25 MG PO TABS
25.0000 mg | ORAL_TABLET | Freq: Every day | ORAL | Status: DC
  Administered 2015-06-16 – 2015-06-17 (×2): 25 mg via ORAL
  Filled 2015-06-15 (×2): qty 1

## 2015-06-15 NOTE — Plan of Care (Signed)
Problem: Phase I Progression Outcomes Goal: Aspirin unless contraindicated Outcome: Not Met (add Reason) ASA listed as an allergy : irritates stomach

## 2015-06-15 NOTE — Progress Notes (Addendum)
Pt arrived from 2 heart. Pt oriented to room, call bell, and call light. CCMD called and verified.

## 2015-06-15 NOTE — Progress Notes (Signed)
Subjective:  Diuresing well. Out over 4L overnight. Weight down 19 pounds. Renal function stable. Denies dyspnea.  Has agreed to apixaban for chronic AF.    Objective: Vital signs in last 24 hours: Temp:  [97.5 F (36.4 C)-98.5 F (36.9 C)] 98 F (36.7 C) (12/24 0758) Pulse Rate:  [60-112] 112 (12/24 1004) Resp:  [12-27] 27 (12/24 0800) BP: (115-167)/(82-119) 157/114 mmHg (12/24 0800) SpO2:  [95 %-99 %] 96 % (12/24 0912) Weight:  [131.815 kg (290 lb 9.6 oz)] 131.815 kg (290 lb 9.6 oz) (12/24 0620) Weight change: -17.872 kg (-39 lb 6.4 oz) Last BM Date: 06/13/15 Intake/Output from previous day: 12/23 0701 - 12/24 0700 In: 1446.5 [P.O.:710; I.V.:736.5] Out: 4460 [Urine:4460] Intake/Output this shift: Total I/O In: -  Out: 350 [Urine:350]  PE: General: lying flat in bed NAD Skin:Warm and dry,  HEENT:normocephalic, sclera clear, mucus membranes moist Heart:irreg irreg without murmur, gallup, rub or click Lungs:clear ant  BMW:UXLK, non tender, + mildly distended + BS, do not palpate liver spleen or masses Ext:1+ lower ext edema, 2+ pedal pulses, 2+ radial pulses Neuro:alert and oriented X 3, MAE, follows commands, + facial symmetry  Tele: a fib 90-110   Lab Results:  Recent Labs  06/14/15 0328 06/15/15 0432  WBC 3.2* 3.4*  HGB 12.1* 12.6*  HCT 36.9* 39.1  PLT 220 212   BMET  Recent Labs  06/14/15 0328 06/15/15 0432  NA 142 141  K 3.4* 3.6  CL 107 104  CO2 29 27  GLUCOSE 83 79  BUN 11 12  CREATININE 1.14 1.14  CALCIUM 8.5* 8.8*    Recent Labs  06/13/15 2326 06/14/15 0328  TROPONINI 1.22* 1.38*    Lab Results  Component Value Date   CHOL 139 06/14/2015   HDL 28* 06/14/2015   LDLCALC 99 06/14/2015   TRIG 58 06/14/2015   CHOLHDL 5.0 06/14/2015   Lab Results  Component Value Date   HGBA1C 5.8* 12/07/2014     Lab Results  Component Value Date   TSH 0.322* 04/02/2014    Hepatic Function Panel  Recent Labs  06/13/15 0958    PROT 6.2*  ALBUMIN 3.4*  AST 37  ALT 24  ALKPHOS 49  BILITOT 2.0*    Recent Labs  06/14/15 0328  CHOL 139   No results for input(s): PROTIME in the last 72 hours.     Studies/Results: No results found. RT Heart CATH (06/14/15): Left heart: normal cors Right Heart Pressures Elevated LV EDP consistent with volume overload. Right atrial pressure-15/16, mean equals 14 Right ventricular pressure-67/4 Pulmonary artery pressure-75/30, mean equals 50 Pulmonary capillary wedge pressure-equals 27, V-wave equals 42, mean equals 33 LVEDP was 22      Medications: I have reviewed the patient's current medications. Scheduled Meds: . apixaban  5 mg Oral BID  . aspirin EC  81 mg Oral Daily  . atorvastatin  80 mg Oral q1800  . budesonide (PULMICORT) nebulizer solution  0.5 mg Nebulization Daily  . cloNIDine  0.3 mg Oral BID  . diltiazem  240 mg Oral q morning - 10a  . furosemide  40 mg Intravenous Q12H  . metoprolol succinate  50 mg Oral Daily  . nitroGLYCERIN  0.4 mg Sublingual Once  . pantoprazole  40 mg Oral Daily  . sodium chloride  3 mL Intravenous Q12H  . spironolactone  12.5 mg Oral Daily   Continuous Infusions:   PRN Meds:.sodium chloride, acetaminophen, morphine injection, morphine injection,  ondansetron **OR** ondansetron (ZOFRAN) IV, sodium chloride  Assessment/Plan: 49 yo with severe HTN, diastolic CHF now with chest tightness/SOB with exertion x 1 wk On arrival to ER evid of CHF   1 R and L heart cath done to define anatomy and filling pressures  --normal coronary arteries --elevated LVEDP , pulmonary HTN  He has diastolic heart failure and hypertensive heart disease --elevated troponin due to pulmonary HTN/HF  2 Atrial fib Permanent Continue rate control and no anticoagulation for now. CHADSVASc of 2 so far   3. HTN --BP still elevated  4 Acute on chronic diastolic CHF Volume status up Received IV lasix elevated LVEDP  --echo 05/28/15 55-60%  RV mildly HK  5. Lipds Eval LDL 99, HDL 28, T chol 139 TG 58   6. Hypokalemia improving with  Spironolactone.   Cath hemodynamics, echo images and tele reviewed personally. He has severe diastolic HF with secondary pulmonary HTN, He is much improved with IV lasix. Would continue IV diuretics at least on more day. BP still elevated. Start losartan. AF rate control borderline. Can increase metoprolol as needed. Continue apixaban. Will need outpatient sleep study if he hasn't had one alread. Can go to telemetry if OK with primary team.  Bensimhon, Daniel,MD 11:42 AM

## 2015-06-15 NOTE — Progress Notes (Signed)
Order fro transfer, 2W05 available. Pt informed and verbalized understanding. Report called to 2W RN.  Elink and CCMD both notified. Pt transported via wheelchair to new room with belongings

## 2015-06-15 NOTE — Progress Notes (Signed)
[place] TEAM 1 - Stepdown/ICU TEAM PROGRESS NOTE  [name] PNT:[id number] DOB: Oct 26, 1965 DOA: 06/13/2015 [facility]: [name]  Admit HPI / Brief Narrative: 49 y.o. male with hx of obesity, OSA, HTN, acute on chronic diastolic CHF, and HTN who presented to the ER with substernal CP. He was recently admitted for acute on chronic diastolic at [facility].   Work up in the ER showed afib with controlled rate, no acute ST/T changes, and his troponin was 2.73. His Cr was 1.05, K of 3.0, and normal WBC with Hb of 12 g per dL. Cardiology consultation was requested by EDP and Dr Dietrich Pates recommended for patient to be admitted to the Hospitalist service. Patient said ASA irritates his stomach. He was recommended previously to take Xarelto, but didn't due to upset stomach. No reported black or bloody stool.   HPI/Subjective: The pt states he is tired.  He denies cp, sob, n/v, or ha.  He tells me we "won't be able to get his BP under any better control because he has the irregular heart beat."    Assessment/Plan:  Chest pain w/ positive troponin  Clean cath - possibly related to pulm edema/R heart failure - cont med tx for same   Chronic severe diastolic CHF w/ acute exacerbation - secondary Pulm HTN EF 55% on TTE earlier this month - baseline wgt appears to be ~140kg - currently  131.8kg - net negative ~7.4L since admit - appears to still have room for further diuresis, though not severely overloaded at present   Chronic afib CHADSVASc is 2 - apixaban started per Cards - rate presently reasonably controlled   HTN Not yet at goal - ARB added by Cards today   HLD LDL 99 - lipitor started this admit   [facility]HS Will need outpt sleep study   Morbid Obesity - Body mass index is 39.4 kg/(m^2).  Code Status: FULL Family Communication: no family present at time of exam Disposition Plan: transfer tele bed - possible d/c home next 24-48hrs   Consultants: [facility] Card[facility]    Procedures: 12/23 cardiac cath - normal coronaries   Antibiotics: none  DVT prophylaxis: apixaban  Objective: Blood pressure 125/106, pulse 112, temperature 97.5 F (36.4 C), temperature source Oral, resp. rate 26, height 6' (1.829 m), weight 131.815 kg (290 lb 9.6 oz), SpO2 95 %.  Intake/Output Summary (Last 24 hours) at 06/15/15 1210 Last data filed at 06/15/15 0800  Gross per 24 hour  Intake   1120 ml  Output   3410 ml  Net  -2290 ml   Exam: General: No acute respiratory distress - alert  Lungs: Clear to auscultation bilaterally without wheezes / crackles Cardiovascular: Regular rate without murmur gallop or rub  Abdomen: Nontender, nondistended, soft, bowel sounds positive, no rebound, no ascites, no appreciable mass Extremities: No significant cyanosis, or clubbing;  1+ edema bilateral lower extremities  Data Reviewed: Basic Metabolic Panel:  Recent Labs Lab 06/13/15 0958 06/14/15 0328 06/15/15 0432  NA 141 142 141  K 3.0* 3.4* 3.6  CL 109 107 104  CO2 25 29 27   GLUCOSE 93 83 79  BUN 16 11 12   CREATININE 1.05 1.14 1.14  CALCIUM 8.6* 8.5* 8.8*    CBC:  Recent Labs Lab 06/13/15 0958 06/14/15 0328 06/15/15 0432  WBC 4.1 3.2* 3.4*  NEUTROABS 2.7  --   --   HGB 12.2* 12.1* 12.6*  HCT 36.6* 36.9* 39.1  MCV 86.3 87.9 87.5  PLT 223 220  212    Liver Function Tests:  Recent Labs Lab 06/13/15 0958  AST 37  ALT 24  ALKPHOS 49  BILITOT 2.0*  PROT 6.2*  ALBUMIN 3.4*    Coags:  Recent Labs Lab 06/14/15 0328  INR 1.37    Cardiac Enzymes:  Recent Labs Lab 06/13/15 0958 06/13/15 1129 06/13/15 1758 06/13/15 2326 06/14/15 0328  TROPONINI 2.73* 2.58* 1.45* 1.22* 1.38*    CBG:  Recent Labs Lab 06/13/15 1651  GLUCAP 76    Recent Results (from the past 240 hour(s))  MRSA PCR Screening     Status: None   Collection Time: 06/13/15  4:53 PM  Result Value Ref Range Status   MRSA by PCR NEGATIVE NEGATIVE Final    Comment:         The GeneXpert MRSA Assay (FDA approved for NASAL specimens only), is one component of a comprehensive MRSA colonization surveillance program. It is not intended to diagnose MRSA infection nor to guide or monitor treatment for MRSA infections.      Studies:   Recent x-ray studies have been reviewed in detail by the Attending Physician  Scheduled Meds:  Scheduled Meds: . apixaban  5 mg Oral BID  . aspirin EC  81 mg Oral Daily  . atorvastatin  80 mg Oral q1800  . budesonide (PULMICORT) nebulizer solution  0.5 mg Nebulization Daily  . cloNIDine  0.3 mg Oral BID  . diltiazem  240 mg Oral q morning - 10a  . furosemide  40 mg Intravenous Q12H  . losartan  50 mg Oral Daily  . metoprolol succinate  50 mg Oral Daily  . nitroGLYCERIN  0.4 mg Sublingual Once  . pantoprazole  40 mg Oral Daily  . sodium chloride  3 mL Intravenous Q12H  . [START ON 06/16/2015] spironolactone  25 mg Oral Daily    Time spent on care of this patient: 35 mins   Camerin Jimenez T , MD   [facility] Office  [phone] Pager - Text Page per Loretha Stapler as per below:  On-Call/Text Page:      [email or web address]      password [email or web address]  If 7PM-7AM, please contact night-coverage [email or web address] Password [email or web address] 06/15/2015, 12:10 PM   LOS: 2 days

## 2015-06-16 LAB — COMPREHENSIVE METABOLIC PANEL
ALT: 21 U/L (ref 17–63)
AST: 25 U/L (ref 15–41)
Albumin: 3.1 g/dL — ABNORMAL LOW (ref 3.5–5.0)
Alkaline Phosphatase: 50 U/L (ref 38–126)
Anion gap: 9 (ref 5–15)
BUN: 16 mg/dL (ref 6–20)
CHLORIDE: 106 mmol/L (ref 101–111)
CO2: 26 mmol/L (ref 22–32)
CREATININE: 1.47 mg/dL — AB (ref 0.61–1.24)
Calcium: 8.8 mg/dL — ABNORMAL LOW (ref 8.9–10.3)
GFR, EST NON AFRICAN AMERICAN: 55 mL/min — AB (ref >60)
Glucose, Bld: 93 mg/dL (ref 65–99)
POTASSIUM: 3.5 mmol/L (ref 3.5–5.1)
Sodium: 141 mmol/L (ref 135–145)
Total Bilirubin: 1.7 mg/dL — ABNORMAL HIGH (ref 0.3–1.2)
Total Protein: 5.7 g/dL — ABNORMAL LOW (ref 6.5–8.1)

## 2015-06-16 LAB — CBC
HCT: 40.4 % (ref 39.0–52.0)
Hemoglobin: 13.4 g/dL (ref 13.0–17.0)
MCH: 28.8 pg (ref 26.0–34.0)
MCHC: 33.2 g/dL (ref 30.0–36.0)
MCV: 86.9 fL (ref 78.0–100.0)
PLATELETS: 233 10*3/uL (ref 150–400)
RBC: 4.65 MIL/uL (ref 4.22–5.81)
RDW: 13.9 % (ref 11.5–15.5)
WBC: 3.3 10*3/uL — AB (ref 4.0–10.5)

## 2015-06-16 MED ORDER — FUROSEMIDE 40 MG PO TABS
40.0000 mg | ORAL_TABLET | Freq: Every day | ORAL | Status: DC
Start: 2015-06-17 — End: 2015-06-17
  Administered 2015-06-17: 40 mg via ORAL
  Filled 2015-06-16 (×2): qty 1

## 2015-06-16 NOTE — Progress Notes (Signed)
[place] TEAM 1 - Stepdown/ICU TEAM PROGRESS NOTE  [name] HYQ:[id number] DOB: 06/24/1965 DOA: 06/13/2015 [facility]: [name]  Admit HPI / Brief Narrative: 49 y.o. male with hx of obesity, OSA, HTN, acute on chronic diastolic CHF, and HTN who presented to the ER with substernal CP. He was recently admitted for acute on chronic diastolic at [facility].   Work up in the ER showed afib with controlled rate, no acute ST/T changes, and his troponin was 2.73. His Cr was 1.05, K of 3.0, and normal WBC with Hb of 12 g per dL. Cardiology consultation was requested by EDP and Dr Dietrich Pates recommended for patient to be admitted to the Hospitalist service. Patient said ASA irritates his stomach. He was recommended previously to take Xarelto, but didn't due to upset stomach. No reported black or bloody stool.   HPI/Subjective: No new complaints today.  Denies cp, sob, n/v, or abdom pain.      Assessment/Plan:  Chest pain w/ positive troponin  Clean cath - possibly related to pulm edema/R heart failure - cont med tx for same - pain has resolved   Chronic severe diastolic CHF w/ acute exacerbation - secondary Pulm HTN EF 55% on TTE earlier this month - baseline wgt appears to be ~140kg - currently  131.4kg - net negative ~8L since admit - appears to still have room for further diuresis, though not severely overloaded at present - w/ increasing crt will transition to oral lasix today   Chronic afib CHADSVASc is 2 - apixaban started per Cards - follow rate as further med titration may be required   HTN Not yet at goal - ARB added by Cards yesterday - follow today w/ ongoing diuresis   HLD LDL 99 - lipitor started this admit   OHS Will need outpt sleep study   Morbid Obesity - Body mass index is 39.3 kg/(m^2).  Code Status: FULL Family Communication: no family present at time of exam Disposition Plan: ambulate - possible d/c home next 24-48hrs   Consultants: [facility]  Cardiology   Procedures: 12/23 cardiac cath - normal coronaries   Antibiotics: none  DVT prophylaxis: apixaban  Objective: Blood pressure 141/88, pulse 85, temperature 98.4 F (36.9 C), temperature source Oral, resp. rate 18, height 6' (1.829 m), weight 131.452 kg (289 lb 12.8 oz), SpO2 100 %.  Intake/Output Summary (Last 24 hours) at 06/16/15 1115 Last data filed at 06/16/15 0730  Gross per 24 hour  Intake    440 ml  Output   1250 ml  Net   -810 ml   Exam: General: No acute respiratory distress  Lungs: Clear to auscultation bilaterally without crackles Cardiovascular: Regular rate without murmur  Abdomen: Nontender, nondistended, soft, bowel sounds positive, no rebound, no ascites, no appreciable mass Extremities: No significant cyanosis, or clubbing;  trace edema bilateral lower extremities  Data Reviewed: Basic Metabolic Panel:  Recent Labs Lab 06/13/15 0958 06/14/15 0328 06/15/15 0432 06/16/15 0412  NA 141 142 141 141  K 3.0* 3.4* 3.6 3.5  CL 109 107 104 106  CO2 GLUCOSE 93 83 79 93  BUN CREATININE 1.05 1.14 1.14 1.47*  CALCIUM 8.6* 8.5* 8.8* 8.8*    CBC:  Recent Labs Lab 06/13/15 0958 06/14/15 0328 06/15/15 0432 06/16/15 0412  WBC 4.1 3.2* 3.4* 3.3*  NEUTROABS 2.7  --   --   --   HGB 12.2* 12.1* 12.6* 13.4  HCT  36.6* 36.9* 39.1 40.4  MCV 86.3 87.9 87.5 86.9  PLT 223 220 212 233    Liver Function Tests:  Recent Labs Lab 06/13/15 0958 06/16/15 0412  AST 37 25  ALT 24 21  ALKPHOS 49 50  BILITOT 2.0* 1.7*  PROT 6.2* 5.7*  ALBUMIN 3.4* 3.1*    Coags:  Recent Labs Lab 06/14/15 0328  INR 1.37    Cardiac Enzymes:  Recent Labs Lab 06/13/15 0958 06/13/15 1129 06/13/15 1758 06/13/15 2326 06/14/15 0328  TROPONINI 2.73* 2.58* 1.45* 1.22* 1.38*    CBG:  Recent Labs Lab 06/13/15 1651  GLUCAP 76    Recent Results (from the past 240 hour(s))  MRSA PCR Screening     Status: None   Collection Time:  06/13/15  4:53 PM  Result Value Ref Range Status   MRSA by PCR NEGATIVE NEGATIVE Final    Comment:        The GeneXpert MRSA Assay (FDA approved for NASAL specimens only), is one component of a comprehensive MRSA colonization surveillance program. It is not intended to diagnose MRSA infection nor to guide or monitor treatment for MRSA infections.      Studies:   Recent x-ray studies have been reviewed in detail by the Attending Physician  Scheduled Meds:  Scheduled Meds: . apixaban  5 mg Oral BID  . aspirin EC  81 mg Oral Daily  . atorvastatin  80 mg Oral q1800  . cloNIDine  0.3 mg Oral BID  . diltiazem  240 mg Oral q morning - 10a  . furosemide  40 mg Intravenous Q12H  . losartan  50 mg Oral Daily  . metoprolol succinate  50 mg Oral Daily  . nitroGLYCERIN  0.4 mg Sublingual Once  . pantoprazole  40 mg Oral Daily  . spironolactone  25 mg Oral Daily    Time spent on care of this patient: 25 mins   [facility] T , MD   [facility] Office  [phone] Pager - Text Page per Loretha Stapler as per below:  On-Call/Text Page:      [email or web address]      password [email or web address]  If 7PM-7AM, please contact night-coverage [email or web address] Password [email or web address] 06/16/2015, 11:15 AM   LOS: 3 days

## 2015-06-16 NOTE — Progress Notes (Addendum)
Subjective:  Feeling better. Less SOB. S/P R/L heart cath  Objective:  Temp:  [97.5 F (36.4 C)-98.4 F (36.9 C)] 98.4 F (36.9 C) (12/25 0635) Pulse Rate:  [76-112] 76 (12/25 0635) Resp:  [18-33] 18 (12/25 0635) BP: (115-140)/(96-106) 140/105 mmHg (12/25 0635) SpO2:  [95 %-100 %] 100 % (12/25 0635) Weight:  [289 lb 12.8 oz (131.452 kg)] 289 lb 12.8 oz (131.452 kg) (12/25 3810) Weight change: -12.8 oz (-0.363 kg)  Intake/Output from previous day: 12/24 0701 - 12/25 0700 In: 440 [P.O.:440] Out: 1150 [Urine:1150]  Intake/Output from this shift:    Physical Exam: General appearance: alert and no distress Neck: no adenopathy, no carotid bruit, no JVD, supple, symmetrical, trachea midline and thyroid not enlarged, symmetric, no tenderness/mass/nodules Lungs: clear to auscultation bilaterally Heart: irregularly irregular rhythm Extremities: extremities normal, atraumatic, no cyanosis or edema  Lab Results: Results for orders placed or performed during the hospital encounter of 06/13/15 (from the past 48 hour(s))  CBC     Status: Abnormal   Collection Time: 06/15/15  4:32 AM  Result Value Ref Range   WBC 3.4 (L) 4.0 - 10.5 K/uL   RBC 4.47 4.22 - 5.81 MIL/uL   Hemoglobin 12.6 (L) 13.0 - 17.0 g/dL   HCT 39.1 39.0 - 52.0 %   MCV 87.5 78.0 - 100.0 fL   MCH 28.2 26.0 - 34.0 pg   MCHC 32.2 30.0 - 36.0 g/dL   RDW 13.9 11.5 - 15.5 %   Platelets 212 150 - 400 K/uL  Basic metabolic panel     Status: Abnormal   Collection Time: 06/15/15  4:32 AM  Result Value Ref Range   Sodium 141 135 - 145 mmol/L   Potassium 3.6 3.5 - 5.1 mmol/L   Chloride 104 101 - 111 mmol/L   CO2 27 22 - 32 mmol/L   Glucose, Bld 79 65 - 99 mg/dL   BUN 12 6 - 20 mg/dL   Creatinine, Ser 1.14 0.61 - 1.24 mg/dL   Calcium 8.8 (L) 8.9 - 10.3 mg/dL   GFR calc non Af Amer >60 >60 mL/min   GFR calc Af Amer >60 >60 mL/min    Comment: (NOTE) The eGFR has been calculated using the CKD EPI equation. This  calculation has not been validated in all clinical situations. eGFR's persistently <60 mL/min signify possible Chronic Kidney Disease.    Anion gap 10 5 - 15  Comprehensive metabolic panel     Status: Abnormal   Collection Time: 06/16/15  4:12 AM  Result Value Ref Range   Sodium 141 135 - 145 mmol/L   Potassium 3.5 3.5 - 5.1 mmol/L   Chloride 106 101 - 111 mmol/L   CO2 26 22 - 32 mmol/L   Glucose, Bld 93 65 - 99 mg/dL   BUN 16 6 - 20 mg/dL   Creatinine, Ser 1.47 (H) 0.61 - 1.24 mg/dL   Calcium 8.8 (L) 8.9 - 10.3 mg/dL   Total Protein 5.7 (L) 6.5 - 8.1 g/dL   Albumin 3.1 (L) 3.5 - 5.0 g/dL   AST 25 15 - 41 U/L   ALT 21 17 - 63 U/L   Alkaline Phosphatase 50 38 - 126 U/L   Total Bilirubin 1.7 (H) 0.3 - 1.2 mg/dL   GFR calc non Af Amer 55 (L) >60 mL/min   GFR calc Af Amer >60 >60 mL/min    Comment: (NOTE) The eGFR has been calculated using the CKD EPI equation. This calculation has  not been validated in all clinical situations. eGFR's persistently <60 mL/min signify possible Chronic Kidney Disease.    Anion gap 9 5 - 15  CBC     Status: Abnormal   Collection Time: 06/16/15  4:12 AM  Result Value Ref Range   WBC 3.3 (L) 4.0 - 10.5 K/uL   RBC 4.65 4.22 - 5.81 MIL/uL   Hemoglobin 13.4 13.0 - 17.0 g/dL   HCT 40.4 39.0 - 52.0 %   MCV 86.9 78.0 - 100.0 fL   MCH 28.8 26.0 - 34.0 pg   MCHC 33.2 30.0 - 36.0 g/dL   RDW 13.9 11.5 - 15.5 %   Platelets 233 150 - 400 K/uL    Imaging: Imaging results have been reviewed  Tele- Afib with CVR  Assessment/Plan:   1. Principal Problem: 2.   ACS (acute coronary syndrome) ([place]) 3. Active Problems: 4.   Hyperlipidemia 5.   CHF (congestive heart failure) ([place]) 6.   A-fib ([place]) 7.   Obesity hypoventilation syndrome ([place]) 8.   Elevated troponin 9.   Time Spent Directly with Patient:  20 minutes  Length of Stay:  LOS: 3 days   Pt has PHTN and diastolic CHF with nl cors by cath. CAF rate controlled currently on Eliquis. Good  diuresis on IV lasix (- 7.7L). SCr has just started to increase 1.1-->>1.5. Would D/C IV lasix and change to Lasix 40 mg PO daily. Follow renal Fxn. CRH. Hopefully home next 48 hrs  Quay Burow 06/16/2015, 8:22 AM

## 2015-06-17 DIAGNOSIS — I480 Paroxysmal atrial fibrillation: Secondary | ICD-10-CM

## 2015-06-17 LAB — BASIC METABOLIC PANEL
ANION GAP: 7 (ref 5–15)
BUN: 21 mg/dL — ABNORMAL HIGH (ref 6–20)
CHLORIDE: 104 mmol/L (ref 101–111)
CO2: 30 mmol/L (ref 22–32)
CREATININE: 1.42 mg/dL — AB (ref 0.61–1.24)
Calcium: 8.9 mg/dL (ref 8.9–10.3)
GFR calc non Af Amer: 58 mL/min — ABNORMAL LOW (ref >60)
Glucose, Bld: 90 mg/dL (ref 65–99)
POTASSIUM: 3.8 mmol/L (ref 3.5–5.1)
SODIUM: 141 mmol/L (ref 135–145)

## 2015-06-17 MED ORDER — DILTIAZEM HCL ER COATED BEADS 240 MG PO CP24: 240.0000 mg | ORAL_CAPSULE | Freq: Every morning | ORAL | Status: DC

## 2015-06-17 MED ORDER — ATORVASTATIN CALCIUM 80 MG PO TABS: 80.0000 mg | ORAL_TABLET | Freq: Every day | ORAL | Status: DC

## 2015-06-17 MED ORDER — CLONIDINE HCL 0.3 MG PO TABS: 0.3000 mg | ORAL_TABLET | Freq: Two times a day (BID) | ORAL | Status: DC

## 2015-06-17 MED ORDER — ASPIRIN 81 MG PO TBEC: 81.0000 mg | DELAYED_RELEASE_TABLET | Freq: Every day | ORAL | Status: DC

## 2015-06-17 MED ORDER — LOSARTAN POTASSIUM 50 MG PO TABS: 50.0000 mg | ORAL_TABLET | Freq: Every day | ORAL | Status: DC

## 2015-06-17 MED ORDER — SPIRONOLACTONE 25 MG PO TABS: 25.0000 mg | ORAL_TABLET | Freq: Every day | ORAL | Status: DC

## 2015-06-17 MED ORDER — APIXABAN 5 MG PO TABS: 5.0000 mg | ORAL_TABLET | Freq: Two times a day (BID) | ORAL | Status: DC

## 2015-06-17 MED ORDER — METOPROLOL SUCCINATE ER 50 MG PO TB24: 50.0000 mg | ORAL_TABLET | Freq: Every day | ORAL | Status: DC

## 2015-06-17 MED ORDER — FUROSEMIDE 40 MG PO TABS: 40.0000 mg | ORAL_TABLET | Freq: Every day | ORAL | Status: DC

## 2015-06-17 NOTE — Discharge Summary (Signed)
DISCHARGE SUMMARY  MUHSIN Fitzpatrick  MR#: [id number]  DOB:07/03/1965  Date of Admission: 06/13/2015 Date of Discharge: 06/17/2015  Attending Physician:Rochell Mabie T  Patient's [facility]  Consults:  [facility]   Disposition: D/C home   Follow-up Appts:     Follow-up Information    Follow up with Hoy Register [facility]. Schedule an appointment as soon as possible for a visit in 1 week.   Contact information:   [address] [place] [address] [phone]       Follow up with Prentice Docker A, MD. Schedule an appointment as soon as possible for a visit in 2 weeks.   Specialty:  Cardiology   Contact information:   [address] [place] [address] [phone]      Tests Needing Follow-up: -check renal fxn and K+ on diuretic -f/u BP control -f/u compliance w/ CHF diet, daily weights, medications, and volume restriction  -pt needs outpt sleep study arranged to screen for suspected sleep apnea  -check LFTs in 6-8 weeks as lipitor was initiated this hospital stay   Discharge Diagnoses: Chest pain w/ positive troponin  Chronic severe diastolic CHF w/ acute exacerbation - secondary Pulm HTN Chronic afib HTN HLD OHS Morbid Obesity - Body mass index is 39.3 kg/(m^2)  Procedures: 12/23 cardiac cath - normal coronaries   Initial presentation: 49 y.o. male with hx of obesity, OSA, HTN, acute on chronic diastolic CHF, and HTN who presented to the ER with substernal CP. He was recently admitted for acute on chronic diastolic at [facility].   Work up in the ER showed afib with controlled rate, no acute ST/T changes, and his troponin was 2.73. His Cr was 1.05, K of 3.0, and normal WBC with Hb of 12 g per dL. Cardiology consultation was requested by EDP and Dr Dietrich Pates recommended for patient to be admitted to the Hospitalist service. Patient said ASA irritates his stomach. He was recommended previously to take Xarelto, but didn't  due to upset stomach. No reported black or bloody stool.   Hospital Course:  Chest pain w/ positive troponin  Clean cath - felt to be related to pulm edema/R heart failure - cont med tx for same - pain resolved at time of d/c    Chronic severe diastolic CHF w/ acute exacerbation - secondary Pulm HTN EF 55% on TTE earlier this month - baseline wgt appears to be ~140kg > 131.6kg at time of d/c - net negative ~8L since admit - transitioned to oral lasix - stable for d/c home   Chronic afib CHADSVASc is 2 - apixaban started per Cards - rate well controlled at time of d/c    HTN BP greatly improved w/ diuresis and medication titration - ARB added during this admit - follow up as outpt   HLD LDL 99 - lipitor started this admit   OHS Will need outpt sleep study   Morbid Obesity - Body mass index is 39.3 kg/(m^2).     Medication List    STOP taking these medications        omeprazole 20 MG capsule  Commonly known as:  PRILOSEC     potassium chloride SA 20 MEQ tablet  Commonly known as:  K-DUR,KLOR-CON     rivaroxaban 20 MG Tabs tablet  Commonly known as:  XARELTO      TAKE these medications        ALKA-SELTZER ANTACID PO  Take 1 tablet by mouth as needed (upset  stomach).     apixaban 5 MG Tabs tablet  Commonly known as:  ELIQUIS  Take 1 tablet (5 mg total) by mouth 2 (two) times daily.     aspirin 81 MG EC tablet  Take 1 tablet (81 mg total) by mouth daily.     atorvastatin 80 MG tablet  Commonly known as:  LIPITOR  Take 1 tablet (80 mg total) by mouth daily at 6 PM.     beclomethasone 80 MCG/ACT inhaler  Commonly known as:  QVAR  Inhale 2 puffs into the lungs daily.     cloNIDine 0.3 MG tablet  Commonly known as:  CATAPRES  Take 1 tablet (0.3 mg total) by mouth 2 (two) times daily.     diltiazem 240 MG 24 hr capsule  Commonly known as:  CARDIZEM CD  Take 1 capsule (240 mg total) by mouth every morning.     furosemide 40 MG tablet  Commonly known as:   LASIX  Take 1 tablet (40 mg total) by mouth daily.     losartan 50 MG tablet  Commonly known as:  COZAAR  Take 1 tablet (50 mg total) by mouth daily.     metoprolol succinate 50 MG 24 hr tablet  Commonly known as:  TOPROL-XL  Take 1 tablet (50 mg total) by mouth daily. Take with or immediately following a meal.     spironolactone 25 MG tablet  Commonly known as:  ALDACTONE  Take 1 tablet (25 mg total) by mouth daily.        Day of Discharge BP 124/84 mmHg  Pulse 64  Temp(Src) 97.3 F (36.3 C) (Oral)  Resp 18  Ht 6' (1.829 m)  Wt 131.6 kg (290 lb 2 oz)  BMI 39.34 kg/m2  SpO2 100%  Physical Exam: General: No acute respiratory distress - alert and conversant  Lungs: Clear to auscultation bilaterally without wheezes or crackles Cardiovascular: Regular rate and rhythm without murmur gallop or rub normal S1 and S2 Abdomen: Nontender, nondistended, soft, bowel sounds positive, no rebound, no ascites, no appreciable mass Extremities: No significant cyanosis, clubbing, or edema bilateral lower extremities  Basic Metabolic Panel:  Recent Labs Lab 06/13/15 0958 06/14/15 0328 06/15/15 0432 06/16/15 0412 06/17/15 0525  NA 141 142 141 141 141  K 3.0* 3.4* 3.6 3.5 3.8  CL 109 107 104 106 104  CO2 25 29 27 26 30   GLUCOSE 93 83 79 93 90  BUN 16 11 12 16  21*  CREATININE 1.05 1.14 1.14 1.47* 1.42*  CALCIUM 8.6* 8.5* 8.8* 8.8* 8.9    Liver Function Tests:  Recent Labs Lab 06/13/15 0958 06/16/15 0412  AST 37 25  ALT 24 21  ALKPHOS 49 50  BILITOT 2.0* 1.7*  PROT 6.2* 5.7*  ALBUMIN 3.4* 3.1*   Coags:  Recent Labs Lab 06/14/15 0328  INR 1.37   CBC:  Recent Labs Lab 06/13/15 0958 06/14/15 0328 06/15/15 0432 06/16/15 0412  WBC 4.1 3.2* 3.4* 3.3*  NEUTROABS 2.7  --   --   --   HGB 12.2* 12.1* 12.6* 13.4  HCT 36.6* 36.9* 39.1 40.4  MCV 86.3 87.9 87.5 86.9  PLT 223 220 212 233    Cardiac Enzymes:  Recent Labs Lab 06/13/15 0958 06/13/15 1129  06/13/15 1758 06/13/15 2326 06/14/15 0328  TROPONINI 2.73* 2.58* 1.45* 1.22* 1.38*   BNP (last 3 results)  Recent Labs  05/15/15 2008 05/28/15 0150 06/13/15 0958  BNP 579.0* 423.0* 531.0*    CBG:  Recent  Labs Lab 06/13/15 1651  GLUCAP 76    Recent Results (from the past 240 hour(s))  MRSA PCR Screening     Status: None   Collection Time: 06/13/15  4:53 PM  Result Value Ref Range Status   MRSA by PCR NEGATIVE NEGATIVE Final    Comment:        The GeneXpert MRSA Assay (FDA approved for NASAL specimens only), is one component of a comprehensive MRSA colonization surveillance program. It is not intended to diagnose MRSA infection nor to guide or monitor treatment for MRSA infections.       Time spent in discharge (includes decision making & examination of pt): >35 minutes  06/17/2015, 10:55 AM   [name], MD [facility] Office  [phone] Pager [phone]  On-Call/Text Page:      [email or web address]      password [facility]

## 2015-06-17 NOTE — Progress Notes (Signed)
Patient ID: CRIS TALAVERA, male   DOB: [birth year], 49 y.o.   MRN: [id number]    Patient Name: [name] Date of Encounter: 06/17/2015     Principal Problem:   ACS (acute coronary syndrome) [facility]) Active Problems:   Hyperlipidemia   CHF (congestive heart failure) (HCC)   A-fib (HCC)   Obesity hypoventilation syndrome (HCC)   Elevated troponin    SUBJECTIVE  No chest pain or sob. Thinks his dyspnea is back to baseline.   CURRENT MEDS . apixaban  5 mg Oral BID  . aspirin EC  81 mg Oral Daily  . atorvastatin  80 mg Oral q1800  . cloNIDine  0.3 mg Oral BID  . diltiazem  240 mg Oral q morning - 10a  . furosemide  40 mg Oral Daily  . losartan  50 mg Oral Daily  . metoprolol succinate  50 mg Oral Daily  . nitroGLYCERIN  0.4 mg Sublingual Once  . pantoprazole  40 mg Oral Daily  . spironolactone  25 mg Oral Daily    OBJECTIVE  Filed Vitals:   06/16/15 0919 06/16/15 1300 06/16/15 2000 06/17/15 0457  BP: 141/88 117/80 123/87 124/84  Pulse: 85 72 71 64  Temp:  97.4 F (36.3 C) 97.8 F (36.6 C) 97.3 F (36.3 C)  TempSrc:  Oral Oral Oral  Resp:  Height:      Weight:    290 lb 2 oz (131.6 kg)  SpO2:  100% 100% 100%    Intake/Output Summary (Last 24 hours) at 06/17/15 0912 Last data filed at 06/16/15 1630  Gross per 24 hour  Intake    720 ml  Output   1150 ml  Net   -430 ml   Filed Weights   06/15/15 0620 06/16/15 0635 06/17/15 0457  Weight: 290 lb 9.6 oz (131.815 kg) 289 lb 12.8 oz (131.452 kg) 290 lb 2 oz (131.6 kg)    PHYSICAL EXAM  General: Pleasant, obese middle aged man, NAD. Neuro: Alert and oriented X 3. Moves all extremities spontaneously. Psych: Normal affect. HEENT:  Normal  Neck: Supple without bruits, JVD - 7 cm. Lungs:  Resp regular and unlabored, CTA. Heart: RRR no s3, s4, or murmurs. Abdomen: Soft, non-tender, non-distended, BS + x 4.  Extremities: No clubbing, cyanosis, trace peripheral. DP/PT/Radials 2+ and equal  bilaterally.  Accessory Clinical Findings  CBC  Recent Labs  06/15/15 0432 06/16/15 0412  WBC 3.4* 3.3*  HGB 12.6* 13.4  HCT 39.1 40.4  MCV 87.5 86.9  PLT 212 233   Basic Metabolic Panel  Recent Labs  06/16/15 0412 06/17/15 0525  NA 141 141  K 3.5 3.8  CL 106 104  CO2 26 30  GLUCOSE 93 90  BUN 16 21*  CREATININE 1.47* 1.42*  CALCIUM 8.8* 8.9   Liver Function Tests  Recent Labs  06/16/15 0412  AST 25  ALT 21  ALKPHOS 50  BILITOT 1.7*  PROT 5.7*  ALBUMIN 3.1*   No results for input(s): LIPASE, AMYLASE in the last 72 hours. Cardiac Enzymes No results for input(s): CKTOTAL, CKMB, CKMBINDEX, TROPONINI in the last 72 hours. BNP Invalid input(s): POCBNP D-Dimer No results for input(s): DDIMER in the last 72 hours. Hemoglobin A1C No results for input(s): HGBA1C in the last 72 hours. Fasting Lipid Panel No results for input(s): CHOL, HDL, LDLCALC, TRIG, CHOLHDL, LDLDIRECT in the last 72 hours. Thyroid Function Tests No results for input(s): TSH, T4TOTAL, T3FREE, THYROIDAB in the last  72 hours.  Invalid input(s): FREET3  TELE  nsr  Radiology/Studies  Dg Chest 2 View  06/13/2015  CLINICAL DATA:  Productive cough, congestion and shortness of breath for 1 month. EXAM: CHEST  2 VIEW COMPARISON:  05/28/2015 FINDINGS: Cardiomegaly. Bilateral perihilar and lower lobe opacities, improving since prior study. Persistent opacities, right greater than left. No effusions. No acute bony abnormality. IMPRESSION: Improvement in bilateral perihilar and lower lobe opacities with continued opacities, right greater than left. This could reflect improving edema or infection. Stable cardiomegaly. Electronically Signed   By: Charlett Nose M.D.   On: 06/13/2015 09:58   Dg Chest 2 View  05/28/2015  CLINICAL DATA:  Acute onset of worsening shortness of breath. Initial encounter. EXAM: CHEST  2 VIEW COMPARISON:  Chest radiograph performed 05/15/2015 FINDINGS: The lungs are  well-aerated. Bibasilar and bilateral central airspace opacities raise concern for pulmonary edema, though multifocal pneumonia could have a similar appearance. No definite significant pleural effusion or pneumothorax is seen, though trace fluid is noted along the fissures. The heart is enlarged.  No acute osseous abnormalities are seen. IMPRESSION: Bibasilar and bilateral central airspace opacities raise concern for pulmonary edema, though multifocal pneumonia could have a similar appearance. Cardiomegaly. Electronically Signed   By: Roanna Raider M.D.   On: 05/28/2015 02:38    ASSESSMENT AND PLAN  1. Acute on chronic diastolic heart failure/pulm HTN - he appears euvolemic. He states he was on daily lasix as an outpatient. He should probably be discharged on 40-60 mg twice daily. He sees Dr. Darl Householder in our [place] office and will need followup there. From my perspective he could be discharged home after lunch. 2. Atrial fib -he is in NSR. Continue Eliquis 3. HTN - stable 4. Acute on chronic renal, stage 3 - his creatinine is stable. He is encouraged to reduce his sodium intake.   Brooke Steinhilber,M.D.  06/17/2015 9:12 AM

## 2015-06-17 NOTE — Progress Notes (Signed)
ANTICOAGULATION CONSULT NOTE - Follow Up Consult  Pharmacy Consult for Eliquis Indication: Chronic Afib  Allergies  Allergen Reactions  . Bee Venom Shortness Of Breath and Swelling  . Aspirin Other (See Comments)    Chewable 81 mg tablets upsets patients stomach  . Banana Diarrhea  . Hydralazine Other (See Comments)    Patient states it causes Tachycardia    Patient Measurements: Height: 6' (182.9 cm) Weight: 290 lb 2 oz (131.6 kg) IBW/kg (Calculated) : 77.6  Vital Signs: Temp: 97.3 F (36.3 C) (12/26 0457) Temp Source: Oral (12/26 0457) BP: 124/84 mmHg (12/26 0457) Pulse Rate: 64 (12/26 0457)  Labs:  Recent Labs  06/15/15 0432 06/16/15 0412 06/17/15 0525  HGB 12.6* 13.4  --   HCT 39.1 40.4  --   PLT 212 233  --   CREATININE 1.14 1.47* 1.42*    Estimated Creatinine Clearance: 90.2 mL/min (by C-G formula based on Cr of 1.42).  Assessment: 49 yo M admitted on 06/13/2015 with CP found to have normal coronary arteries on LHC. Patient was non-compliant with Xarelto PTA for chronic AF so was switched to Eliquis during this admission. CBC remains stable and wnl with no reported significant s/s bleeding noted. SCr has trended up slightly to 1.42. Dose remains appropriate.   Goal of Therapy:  Monitor platelets by anticoagulation protocol: Yes   Plan:  - Eliquis 5 mg PO BID - Monitor renal function and for s/s bleeding  Kiylee Thoreson K. Bonnye Fava, PharmD, BCPS, CPP Clinical Pharmacist Pager: [phone] Phone: [phone] 06/17/2015 9:23 AM

## 2015-06-17 NOTE — Discharge Instructions (Signed)
Heart Failure  Heart failure means your heart has trouble pumping blood. This makes it hard for your body to work well. Heart failure is usually a long-term (chronic) condition. You must take good care of yourself and follow your doctor's treatment plan. HOME CARE  Take your heart medicine as told by your doctor.  Do not stop taking medicine unless your doctor tells you to.  Do not skip any dose of medicine.  Refill your medicines before they run out.  Take other medicines only as told by your doctor or pharmacist.  Stay active if told by your doctor. The elderly and people with severe heart failure should talk with a doctor about physical activity.  Eat heart-healthy foods. Choose foods that are without trans fat and are low in saturated fat, cholesterol, and salt (sodium). This includes fresh or frozen fruits and vegetables, fish, lean meats, fat-free or low-fat dairy foods, whole grains, and high-fiber foods. Lentils and dried peas and beans (legumes) are also good choices.  Limit salt if told by your doctor.  Cook in a healthy way. Roast, grill, broil, bake, poach, steam, or stir-fry foods.  Limit fluids as told by your doctor.  Weigh yourself every morning. Do this after you pee (urinate) and before you eat breakfast. Write down your weight to give to your doctor.  Take your blood pressure and write it down if your doctor tells you to.  Ask your doctor how to check your pulse. Check your pulse as told.  Lose weight if told by your doctor.  Stop smoking or chewing tobacco. Do not use gum or patches that help you quit without your doctor's approval.  Schedule and go to doctor visits as told.  Nonpregnant women should have no more than 1 drink a day. Men should have no more than 2 drinks a day. Talk to your doctor about drinking alcohol.  Stop illegal drug use.  Stay current with shots (immunizations).  Manage your health conditions as told by your doctor.  Learn to  manage your stress.  Rest when you are tired.  If it is really hot outside:  Avoid intense activities.  Use air conditioning or fans, or get in a cooler place.  Avoid caffeine and alcohol.  Wear loose-fitting, lightweight, and light-colored clothing.  If it is really cold outside:  Avoid intense activities.  Layer your clothing.  Wear mittens or gloves, a hat, and a scarf when going outside.  Avoid alcohol.  Learn about heart failure and get support as needed.  Get help to maintain or improve your quality of life and your ability to care for yourself as needed. GET HELP IF:   You gain weight quickly.  You are more short of breath than usual.  You cannot do your normal activities.  You tire easily.  You cough more than normal, especially with activity.  You have any or more puffiness (swelling) in areas such as your hands, feet, ankles, or belly (abdomen).  You cannot sleep because it is hard to breathe.  You feel like your heart is beating fast (palpitations).  You get dizzy or light-headed when you stand up. GET HELP RIGHT AWAY IF:   You have trouble breathing.  There is a change in mental status, such as becoming less alert or not being able to focus.  You have chest pain or discomfort.  You faint. MAKE SURE YOU:   Understand these instructions.  Will watch your condition.  Will get help right away if  you are not doing well or get worse.   This information is not intended to replace advice given to you by your health care provider. Make sure you discuss any questions you have with your health care provider.   Document Released: 03/17/2008 Document Revised: 06/29/2014 Document Reviewed: 07/25/2012 Elsevier Interactive Patient Education 2016 [employer].   Information on my medicine - ELIQUIS (apixaban)  This medication education was reviewed with me or my healthcare representative as part of my discharge preparation.  The pharmacist that  spoke with me during my hospital stay was:  Elvin So, [facility]  Why was Eliquis prescribed for you? Eliquis was prescribed for you to reduce the risk of a blood clot forming that can cause a stroke if you have a medical condition called atrial fibrillation (a type of irregular heartbeat).  What do You need to know about Eliquis ? Take your Eliquis TWICE DAILY - one tablet in the morning and one tablet in the evening with or without food. If you have difficulty swallowing the tablet whole please discuss with your pharmacist how to take the medication safely.  Take Eliquis exactly as prescribed by your doctor and DO NOT stop taking Eliquis without talking to the doctor who prescribed the medication.  Stopping may increase your risk of developing a stroke.  Refill your prescription before you run out.  After discharge, you should have regular check-up appointments with your healthcare provider that is prescribing your Eliquis.  In the future your dose may need to be changed if your kidney function or weight changes by a significant amount or as you get older.  What do you do if you miss a dose? If you miss a dose, take it as soon as you remember on the same day and resume taking twice daily.  Do not take more than one dose of ELIQUIS at the same time to make up a missed dose.  Important Safety Information A possible side effect of Eliquis is bleeding. You should call your healthcare provider right away if you experience any of the following: ? Bleeding from an injury or your nose that does not stop. ? Unusual colored urine (red or dark brown) or unusual colored stools (red or black). ? Unusual bruising for unknown reasons. ? A serious fall or if you hit your head (even if there is no bleeding).  Some medicines may interact with Eliquis and might increase your risk of bleeding or clotting while on Eliquis. To help avoid this, consult your healthcare provider or pharmacist prior to  using any new prescription or non-prescription medications, including herbals, vitamins, non-steroidal anti-inflammatory drugs (NSAIDs) and supplements.  This website has more information on Eliquis (apixaban): http://www.eliquis.com/eliquis/home

## 2015-06-17 NOTE — Care Management Note (Signed)
Case Management Note Donn Pierini RN, BSN Unit 2W-Case Manager [phone]  Patient Details  Name: [name] MRN: [id number] Date of Birth: 05-05-1966  Subjective/Objective:   Pt admitted with ACS                 Action/Plan: PTA pt lived at home- was active with [facility] for HH-RN-disease management- resumption orders have been placed- spoke with pt at bedside- who is agreeable to [facility] services continuing to come to home with [facility]- pt also has been started on Eliquis- 30 day free card given to pt- unable to do benefits check today due to insurance companies closed for holiday- instructed pt to have pharmacy check copay when he takes his script to them. Pt confirmed that he does have insurance. Contacted Bethany with [facility] regarding pt's discharge- and resumption of HH services with [facility].  Expected Discharge Date:      06/17/15            Expected Discharge Plan:  Home w Home Health Services  In-House Referral:     Discharge planning Services  CM Consult, Medication Assistance  Post Acute Care Choice:  Home Health, Resumption of Svcs/PTA Provider Choice offered to:  Patient  DME Arranged:    DME Agency:     HH Arranged:  Disease Management, RN HH Agency:  [facility]  Status of Service:  Completed, signed off  Medicare Important Message Given:    Date Medicare IM Given:    Medicare IM give by:    Date Additional Medicare IM Given:    Additional Medicare Important Message give by:     If discussed at Long Length of Stay Meetings, dates discussed:    Discharge Disposition: Home with Home Health    Additional Comments:  Darrold Span, RN 06/17/2015, 12:31 PM

## 2015-06-17 NOTE — Progress Notes (Signed)
UR Completed. Starkisha Tullis, RN, BSN.  [phone] 

## 2015-07-04 ENCOUNTER — Emergency Department (HOSPITAL_COMMUNITY)
Admission: EM | Admit: 2015-07-04 | Discharge: 2015-07-04 | Disposition: A | Discharge status: Home / Self Care | Payer: Medicare Other | Attending: Emergency Medicine | Admitting: Emergency Medicine

## 2015-07-04 ENCOUNTER — Emergency Department (HOSPITAL_COMMUNITY): Payer: Medicare Other

## 2015-07-04 DIAGNOSIS — K219 Gastro-esophageal reflux disease without esophagitis: Secondary | ICD-10-CM | POA: Diagnosis not present

## 2015-07-04 DIAGNOSIS — E876 Hypokalemia: Secondary | ICD-10-CM | POA: Insufficient documentation

## 2015-07-04 DIAGNOSIS — I5033 Acute on chronic diastolic (congestive) heart failure: Secondary | ICD-10-CM

## 2015-07-04 DIAGNOSIS — I110 Hypertensive heart disease with heart failure: Secondary | ICD-10-CM | POA: Diagnosis not present

## 2015-07-04 DIAGNOSIS — Z79899 Other long term (current) drug therapy: Secondary | ICD-10-CM | POA: Diagnosis not present

## 2015-07-04 DIAGNOSIS — Z8711 Personal history of peptic ulcer disease: Secondary | ICD-10-CM | POA: Diagnosis not present

## 2015-07-04 DIAGNOSIS — R778 Other specified abnormalities of plasma proteins: Secondary | ICD-10-CM

## 2015-07-04 DIAGNOSIS — I481 Persistent atrial fibrillation: Secondary | ICD-10-CM | POA: Diagnosis not present

## 2015-07-04 DIAGNOSIS — Z87891 Personal history of nicotine dependence: Secondary | ICD-10-CM | POA: Insufficient documentation

## 2015-07-04 DIAGNOSIS — E785 Hyperlipidemia, unspecified: Secondary | ICD-10-CM | POA: Diagnosis not present

## 2015-07-04 DIAGNOSIS — Z7951 Long term (current) use of inhaled steroids: Secondary | ICD-10-CM | POA: Diagnosis not present

## 2015-07-04 DIAGNOSIS — I4819 Other persistent atrial fibrillation: Secondary | ICD-10-CM

## 2015-07-04 DIAGNOSIS — I4891 Unspecified atrial fibrillation: Secondary | ICD-10-CM | POA: Diagnosis not present

## 2015-07-04 DIAGNOSIS — R0602 Shortness of breath: Secondary | ICD-10-CM | POA: Diagnosis present

## 2015-07-04 DIAGNOSIS — Z7982 Long term (current) use of aspirin: Secondary | ICD-10-CM | POA: Diagnosis not present

## 2015-07-04 DIAGNOSIS — M1990 Unspecified osteoarthritis, unspecified site: Secondary | ICD-10-CM | POA: Insufficient documentation

## 2015-07-04 DIAGNOSIS — R7989 Other specified abnormal findings of blood chemistry: Secondary | ICD-10-CM

## 2015-07-04 DIAGNOSIS — J45901 Unspecified asthma with (acute) exacerbation: Secondary | ICD-10-CM | POA: Insufficient documentation

## 2015-07-04 DIAGNOSIS — D649 Anemia, unspecified: Secondary | ICD-10-CM | POA: Diagnosis not present

## 2015-07-04 DIAGNOSIS — Z7902 Long term (current) use of antithrombotics/antiplatelets: Secondary | ICD-10-CM | POA: Diagnosis not present

## 2015-07-04 DIAGNOSIS — Z9889 Other specified postprocedural states: Secondary | ICD-10-CM | POA: Insufficient documentation

## 2015-07-04 LAB — COMPREHENSIVE METABOLIC PANEL
ALBUMIN: 3.6 g/dL (ref 3.5–5.0)
ALT: 22 U/L (ref 17–63)
ANION GAP: 7 (ref 5–15)
AST: 24 U/L (ref 15–41)
Alkaline Phosphatase: 57 U/L (ref 38–126)
BILIRUBIN TOTAL: 1.7 mg/dL — AB (ref 0.3–1.2)
BUN: 22 mg/dL — AB (ref 6–20)
CHLORIDE: 110 mmol/L (ref 101–111)
CO2: 25 mmol/L (ref 22–32)
Calcium: 8.5 mg/dL — ABNORMAL LOW (ref 8.9–10.3)
Creatinine, Ser: 1.09 mg/dL (ref 0.61–1.24)
GFR calc Af Amer: >60 mL/min (ref >60)
GFR calc non Af Amer: >60 mL/min (ref >60)
GLUCOSE: 108 mg/dL — AB (ref 65–99)
POTASSIUM: 3.1 mmol/L — AB (ref 3.5–5.1)
SODIUM: 142 mmol/L (ref 135–145)
TOTAL PROTEIN: 6.2 g/dL — AB (ref 6.5–8.1)

## 2015-07-04 LAB — CBC
HEMATOCRIT: 37.2 % — AB (ref 39.0–52.0)
HEMOGLOBIN: 12.3 g/dL — AB (ref 13.0–17.0)
MCH: 28.6 pg (ref 26.0–34.0)
MCHC: 33.1 g/dL (ref 30.0–36.0)
MCV: 86.5 fL (ref 78.0–100.0)
Platelets: 163 10*3/uL (ref 150–400)
RBC: 4.3 MIL/uL (ref 4.22–5.81)
RDW: 13.8 % (ref 11.5–15.5)
WBC: 4.3 10*3/uL (ref 4.0–10.5)

## 2015-07-04 LAB — PROTIME-INR
INR: 1.32 (ref 0.00–1.49)
PROTHROMBIN TIME: 16.5 s — AB (ref 11.6–15.2)

## 2015-07-04 LAB — TROPONIN I
Troponin I: 0.04 ng/mL — ABNORMAL HIGH (ref <0.031)
Troponin I: 0.04 ng/mL — ABNORMAL HIGH (ref <0.031)

## 2015-07-04 LAB — BRAIN NATRIURETIC PEPTIDE: B NATRIURETIC PEPTIDE 5: 601 pg/mL — AB (ref 0.0–100.0)

## 2015-07-04 MED ORDER — POTASSIUM CHLORIDE CRYS ER 20 MEQ PO TBCR: 20.0000 meq | EXTENDED_RELEASE_TABLET | Freq: Two times a day (BID) | ORAL | Status: DC

## 2015-07-04 MED ORDER — FUROSEMIDE 10 MG/ML IJ SOLN
40.0000 mg | Freq: Once | INTRAMUSCULAR | Status: AC
  Administered 2015-07-04: 40 mg via INTRAVENOUS
  Filled 2015-07-04: qty 4

## 2015-07-04 MED ORDER — POTASSIUM CHLORIDE CRYS ER 20 MEQ PO TBCR
40.0000 meq | EXTENDED_RELEASE_TABLET | Freq: Once | ORAL | Status: AC
  Administered 2015-07-04: 40 meq via ORAL
  Filled 2015-07-04: qty 2

## 2015-07-04 NOTE — ED Notes (Signed)
Ambulatory Saturation :   Pt ambulated ~250 ft around nurses station, maintained sats >96% on room air.

## 2015-07-04 NOTE — ED Provider Notes (Signed)
CSN: [id number]     Arrival date & time 07/04/15  0001 History   First MD Initiated Contact with Patient 07/04/15 0045     Chief Complaint  Patient presents with  . Shortness of Breath     (Consider location/radiation/quality/duration/timing/severity/associated sxs/prior Treatment) Patient is a 50 y.o. male presenting with shortness of breath. The history is provided by the patient.  Shortness of Breath He comes in with increasing shortness of breath since yesterday. He has a history of atrial fibrillation for which he is anticoagulated with apixaban, and chronic diastolic heart failure. He had been admitted to the hospital several weeks ago with non-STEMI and states that he really had not been normal since before that hospitalization. Visiting nurse came today and noted a 10 pound weight gain. He denies chest pain, heaviness, tightness, pressure. Dyspnea is worse with any exertion and with laying flat. He states he has been compliant with his medications.  Past Medical History  Diagnosis Date  . Hypertensive heart disease     Hypertensive heart disease with prior episodes of CHF  . Hyperlipidemia   . Hypertension   . Gastroesophageal reflux disease   . Osteoarthritis   . Peptic ulcer disease   . Morbid obesity (HCC)     /sleep apnea  . Allergic rhinitis     plus h/o asthma  . Tobacco abuse, in remission     Cigarettes discontinued in 2010  . CHF (congestive heart failure) (HCC)   . A-fib (HCC)     2015  . Asthma    Past Surgical History  Procedure Laterality Date  . None    . Cardiac catheterization N/A 06/14/2015    Procedure: Right/Left Heart Cath and Coronary Angiography;  Surgeon: Runell Gess, MD;  Location: [facility] INVASIVE CV LAB;  Service: Cardiovascular;  Laterality: N/A;   Family History  Problem Relation Age of Onset  . Colon cancer Neg Hx   . Inflammatory bowel disease Neg Hx   . Liver disease Neg Hx    Social History  Substance Use Topics  . Smoking  status: Former Smoker -- 0.50 packs/day for 20 years    Types: Cigarettes    Start date: 04/26/1988    Quit date: 06/23/2007  . Smokeless tobacco: Never Used     Comment: 1 ppd former smoker  . Alcohol Use: No     Comment: no etoh since 2009    Review of Systems  Respiratory: Positive for shortness of breath.   All other systems reviewed and are negative.     Allergies  Bee venom; Aspirin; Banana; and Hydralazine  Home Medications   Prior to Admission medications   Medication Sig Start Date End Date Taking? Authorizing Provider  apixaban (ELIQUIS) 5 MG TABS tablet Take 1 tablet (5 mg total) by mouth 2 (two) times daily. 06/17/15   [name], MD  aspirin EC 81 MG EC tablet Take 1 tablet (81 mg total) by mouth daily. 06/17/15   [name], MD  atorvastatin (LIPITOR) 80 MG tablet Take 1 tablet (80 mg total) by mouth daily at 6 PM. 06/17/15   [name], MD  beclomethasone (QVAR) 80 MCG/ACT inhaler Inhale 2 puffs into the lungs daily.     Historical Provider, MD  Calcium Carbonate Antacid (ALKA-SELTZER ANTACID PO) Take 1 tablet by mouth as needed (upset stomach).    Historical Provider, MD  cloNIDine (CATAPRES) 0.3 MG tablet Take 1 tablet (0.3 mg total) by mouth 2 (two) times daily.  06/17/15   [name], MD  diltiazem (CARDIZEM CD) 240 MG 24 hr capsule Take 1 capsule (240 mg total) by mouth every morning. 06/17/15   [name], MD  furosemide (LASIX) 40 MG tablet Take 1 tablet (40 mg total) by mouth daily. 06/17/15   [name], MD  losartan (COZAAR) 50 MG tablet Take 1 tablet (50 mg total) by mouth daily. 06/17/15   [name], MD  metoprolol succinate (TOPROL-XL) 50 MG 24 hr tablet Take 1 tablet (50 mg total) by mouth daily. Take with or immediately following a meal. 06/17/15   [name], MD  spironolactone (ALDACTONE) 25 MG tablet Take 1 tablet (25 mg total) by mouth daily. 06/17/15   [name], MD   BP 162/130 mmHg   Pulse 108  Temp(Src) 98.5 F (36.9 C) (Oral)  Resp 20  Ht 6' (1.829 m)  Wt 333 lb (151.048 kg)  BMI 45.15 kg/m2  SpO2 99% Physical Exam  Nursing note and vitals reviewed.  Morbidly obese 50 year old male, resting comfortably and in no acute distress. Vital signs are significant for hypertension and tachycardia. Oxygen saturation is 99%, which is normal. Head is normocephalic and atraumatic. PERRLA, EOMI. Oropharynx is clear. Neck is nontender and supple without adenopathy or JVD. Back is nontender and there is no CVA tenderness. Lungs have fine bibasilar rales without wheezes or rhonchi. Chest is nontender. Heart has regular rate and rhythm without murmur. Abdomen is soft, flat, nontender without masses or hepatosplenomegaly and peristalsis is normoactive. Extremities have 2+ pretibial edema along with 2+ presacral edema. Skin is warm and dry without rash. Neurologic: Mental status is normal, cranial nerves are intact, there are no motor or sensory deficits.  ED Course  Procedures (including critical care time) Labs Review Results for orders placed or performed during the hospital encounter of 07/04/15  CBC  Result Value Ref Range   WBC 4.3 4.0 - 10.5 K/uL   RBC 4.30 4.22 - 5.81 MIL/uL   Hemoglobin 12.3 (L) 13.0 - 17.0 g/dL   HCT 16.1 (L) 09.6 - 04.5 %   MCV 86.5 78.0 - 100.0 fL   MCH 28.6 26.0 - 34.0 pg   MCHC 33.1 30.0 - 36.0 g/dL   RDW 40.9 81.1 - 91.4 %   Platelets 163 150 - 400 K/uL  Troponin I  Result Value Ref Range   Troponin I 0.04 (H) <0.031 ng/mL  Protime-INR - (order if Patient is taking Coumadin / Warfarin)  Result Value Ref Range   Prothrombin Time 16.5 (H) 11.6 - 15.2 seconds   INR 1.32 0.00 - 1.49  Brain natriuretic peptide  Result Value Ref Range   B Natriuretic Peptide 601.0 (H) 0.0 - 100.0 pg/mL  Comprehensive metabolic panel  Result Value Ref Range   Sodium 142 135 - 145 mmol/L   Potassium 3.1 (L) 3.5 - 5.1 mmol/L   Chloride 110 101 - 111  mmol/L   CO2 25 22 - 32 mmol/L   Glucose, Bld 108 (H) 65 - 99 mg/dL   BUN 22 (H) 6 - 20 mg/dL   Creatinine, Ser 7.82 0.61 - 1.24 mg/dL   Calcium 8.5 (L) 8.9 - 10.3 mg/dL   Total Protein 6.2 (L) 6.5 - 8.1 g/dL   Albumin 3.6 3.5 - 5.0 g/dL   AST 24 15 - 41 U/L   ALT 22 17 - 63 U/L   Alkaline Phosphatase 57 38 - 126 U/L   Total Bilirubin 1.7 (  H) 0.3 - 1.2 mg/dL   GFR calc non Af Amer >60 >60 mL/min   GFR calc Af Amer >60 >60 mL/min   Anion gap 7 5 - 15  Troponin I  Result Value Ref Range   Troponin I 0.04 (H) <0.031 ng/mL   Imaging Review Dg Chest 2 View  07/04/2015  CLINICAL DATA:  50 year old male with chest pain and shortness of breath EXAM: CHEST  2 VIEW COMPARISON:  Radiograph dated 06/13/2015 FINDINGS: There is stable cardiomegaly. There is central vascular and and bibasilar interstitial prominence similar to prior study likely representing a degree of congestion or edema. Superimposed pneumonia is not excluded. There is no focal consolidation, pleural effusion, pneumothorax. The osseous structures appear unremarkable. IMPRESSION: Cardiomegaly with congestive changes versus edema. No focal consolidation. Electronically Signed   By: Elgie Collard M.D.   On: 07/04/2015 02:11   I have personally reviewed and evaluated these images and lab results as part of my medical decision-making.   EKG Interpretation   Date/Time:  Thursday July 04 2015 00:07:30 EST Ventricular Rate:  108 PR Interval:    QRS Duration: 91 QT Interval:  379 QTC Calculation: 508 R Axis:   48 Text Interpretation:  Atrial fibrillation Nonspecific T abnormalities,  lateral leads Prolonged QT interval When compared with ECG of 06/13/2015,  No significant change was found Confirmed by [facility]  MD, Joas Motton ([id number]) on  07/04/2015 12:20:54 AM      MDM   Final diagnoses:  Acute on chronic diastolic congestive heart failure (HCC)  Persistent atrial fibrillation (HCC)  Elevated troponin I level  Hypokalemia   Normochromic normocytic anemia    CHF exacerbation. Chronic atrial fibrillation. Chronic diastolic heart failure. Old records are reviewed confirming recent hospitalization for non-STEMI. Troponin is mildly elevated today which could be residual from prior non-STEMI and will need to be repeated to see if it is rising. BNP is slightly increased compared with last value obtained during hospitalization. Creatinine has improved significantly. He is given a dose of furosemide and will need to repeat troponin.   Repeat troponin is unchanged. He had an excellent response to intravenous furosemide with 2 L of urine output. Following this, he was ambulated around the ED and was able to walk with oxygen saturations not dropping below 96%. He was feeling significantly better and it was decided to allow him to go home. He is instructed to double his furosemide dose for the next 3 days. He is noted to be hypokalemic as well as given a dose of K-Dur and given a prescription for same. Follow-up with PCP in 4 days.  [name], MD 07/04/15 [id number]

## 2015-07-04 NOTE — ED Notes (Addendum)
49 yom PMHx Afib (on apixaban), CHF, HTN, CAD, OSA, former smoker presents via EMS from home with c/c dizziness, shortness of breath, and chest pain. Pt states he had been short of breath all day, went to bed last night and woke up 10pm tonight from chest discomfort. After standing he became dizzy. States he has not been taking some of his meds because of side effects.   EMS found pt HTN 210/140, in Afib 117s,  blood glucose 93, gave NTG x 1 180/80.   Last heart cath 05/2015 at [facility].

## 2015-07-04 NOTE — Discharge Instructions (Signed)
For the next three days, take two furosemide (Lasix) tablets (80 mg) each day, instead on one tablet. Continue to weigh yourself each day.  Heart Failure Heart failure is a condition in which the heart has trouble pumping blood. This means your heart does not pump blood efficiently for your body to work well. In some cases of heart failure, fluid may back up into your lungs or you may have swelling (edema) in your lower legs. Heart failure is usually a long-term (chronic) condition. It is important for you to take good care of yourself and follow your health care provider's treatment plan. CAUSES  Some health conditions can cause heart failure. Those health conditions include:  High blood pressure (hypertension). Hypertension causes the heart muscle to work harder than normal. When pressure in the blood vessels is high, the heart needs to pump (contract) with more force in order to circulate blood throughout the body. High blood pressure eventually causes the heart to become stiff and weak.  Coronary artery disease (CAD). CAD is the buildup of cholesterol and fat (plaque) in the arteries of the heart. The blockage in the arteries deprives the heart muscle of oxygen and blood. This can cause chest pain and may lead to a heart attack. High blood pressure can also contribute to CAD.  Heart attack (myocardial infarction). A heart attack occurs when one or more arteries in the heart become blocked. The loss of oxygen damages the muscle tissue of the heart. When this happens, part of the heart muscle dies. The injured tissue does not contract as well and weakens the heart's ability to pump blood.  Abnormal heart valves. When the heart valves do not open and close properly, it can cause heart failure. This makes the heart muscle pump harder to keep the blood flowing.  Heart muscle disease (cardiomyopathy or myocarditis). Heart muscle disease is damage to the heart muscle from a variety of causes. These can  include drug or alcohol abuse, infections, or unknown reasons. These can increase the risk of heart failure.  Lung disease. Lung disease makes the heart work harder because the lungs do not work properly. This can cause a strain on the heart, leading it to fail.  Diabetes. Diabetes increases the risk of heart failure. High blood sugar contributes to high fat (lipid) levels in the blood. Diabetes can also cause slow damage to tiny blood vessels that carry important nutrients to the heart muscle. When the heart does not get enough oxygen and food, it can cause the heart to become weak and stiff. This leads to a heart that does not contract efficiently.  Other conditions can contribute to heart failure. These include abnormal heart rhythms, thyroid problems, and low blood counts (anemia). Certain unhealthy behaviors can increase the risk of heart failure, including:  Being overweight.  Smoking or chewing tobacco.  Eating foods high in fat and cholesterol.  Abusing illicit drugs or alcohol.  Lacking physical activity. SYMPTOMS  Heart failure symptoms may vary and can be hard to detect. Symptoms may include:  Shortness of breath with activity, such as climbing stairs.  Persistent cough.  Swelling of the feet, ankles, legs, or abdomen.  Unexplained weight gain.  Difficulty breathing when lying flat (orthopnea).  Waking from sleep because of the need to sit up and get more air.  Rapid heartbeat.  Fatigue and loss of energy.  Feeling light-headed, dizzy, or close to fainting.  Loss of appetite.  Nausea.  Increased urination during the night (nocturia). DIAGNOSIS  A diagnosis of heart failure is based on your history, symptoms, physical examination, and diagnostic tests. Diagnostic tests for heart failure may include:  Echocardiography.  Electrocardiography.  Chest X-ray.  Blood tests.  Exercise stress test.  Cardiac angiography.  Radionuclide scans. TREATMENT    Treatment is aimed at managing the symptoms of heart failure. Medicines, behavioral changes, or surgical intervention may be necessary to treat heart failure.  Medicines to help treat heart failure may include:  Angiotensin-converting enzyme (ACE) inhibitors. This type of medicine blocks the effects of a blood protein called angiotensin-converting enzyme. ACE inhibitors relax (dilate) the blood vessels and help lower blood pressure.  Angiotensin receptor blockers (ARBs). This type of medicine blocks the actions of a blood protein called angiotensin. Angiotensin receptor blockers dilate the blood vessels and help lower blood pressure.  Water pills (diuretics). Diuretics cause the kidneys to remove salt and water from the blood. The extra fluid is removed through urination. This loss of extra fluid lowers the volume of blood the heart pumps.  Beta blockers. These prevent the heart from beating too fast and improve heart muscle strength.  Digitalis. This increases the force of the heartbeat.  Healthy behavior changes include:  Obtaining and maintaining a healthy weight.  Stopping smoking or chewing tobacco.  Eating heart-healthy foods.  Limiting or avoiding alcohol.  Stopping illicit drug use.  Physical activity as directed by your health care provider.  Surgical treatment for heart failure may include:  A procedure to open blocked arteries, repair damaged heart valves, or remove damaged heart muscle tissue.  A pacemaker to improve heart muscle function and control certain abnormal heart rhythms.  An internal cardioverter defibrillator to treat certain serious abnormal heart rhythms.  A left ventricular assist device (LVAD) to assist the pumping ability of the heart. HOME CARE INSTRUCTIONS   Take medicines only as directed by your health care provider. Medicines are important in reducing the workload of your heart, slowing the progression of heart failure, and improving your  symptoms.  Do not stop taking your medicine unless directed by your health care provider.  Do not skip any dose of medicine.  Refill your prescriptions before you run out of medicine. Your medicines are needed every day.  Engage in moderate physical activity if directed by your health care provider. Moderate physical activity can benefit some people. The elderly and people with severe heart failure should consult with a health care provider for physical activity recommendations.  Eat heart-healthy foods. Food choices should be free of trans fat and low in saturated fat, cholesterol, and salt (sodium). Healthy choices include fresh or frozen fruits and vegetables, fish, lean meats, legumes, fat-free or low-fat dairy products, and whole grain or high fiber foods. Talk to a dietitian to learn more about heart-healthy foods.  Limit sodium if directed by your health care provider. Sodium restriction may reduce symptoms of heart failure in some people. Talk to a dietitian to learn more about heart-healthy seasonings.  Use healthy cooking methods. Healthy cooking methods include roasting, grilling, broiling, baking, poaching, steaming, or stir-frying. Talk to a dietitian to learn more about healthy cooking methods.  Limit fluids if directed by your health care provider. Fluid restriction may reduce symptoms of heart failure in some people.  Weigh yourself every day. Daily weights are important in the early recognition of excess fluid. You should weigh yourself every morning after you urinate and before you eat breakfast. Wear the same amount of clothing each time you weigh yourself. Record  your daily weight. Provide your health care provider with your weight record.  Monitor and record your blood pressure if directed by your health care provider.  Check your pulse if directed by your health care provider.  Lose weight if directed by your health care provider. Weight loss may reduce symptoms of heart  failure in some people.  Stop smoking or chewing tobacco. Nicotine makes your heart work harder by causing your blood vessels to constrict. Do not use nicotine gum or patches before talking to your health care provider.  Keep all follow-up visits as directed by your health care provider. This is important.  Limit alcohol intake to no more than 1 drink per day for nonpregnant women and 2 drinks per day for men. One drink equals 12 ounces of beer, 5 ounces of wine, or 1 ounces of hard liquor. Drinking more than that is harmful to your heart. Tell your health care provider if you drink alcohol several times a week. Talk with your health care provider about whether alcohol is safe for you. If your heart has already been damaged by alcohol or you have severe heart failure, drinking alcohol should be stopped completely.  Stop illicit drug use.  Stay up-to-date with immunizations. It is especially important to prevent respiratory infections through current pneumococcal and influenza immunizations.  Manage other health conditions such as hypertension, diabetes, thyroid disease, or abnormal heart rhythms as directed by your health care provider.  Learn to manage stress.  Plan rest periods when fatigued.  Learn strategies to manage high temperatures. If the weather is extremely hot:  Avoid vigorous physical activity.  Use air conditioning or fans or seek a cooler location.  Avoid caffeine and alcohol.  Wear loose-fitting, lightweight, and light-colored clothing.  Learn strategies to manage cold temperatures. If the weather is extremely cold:  Avoid vigorous physical activity.  Layer clothes.  Wear mittens or gloves, a hat, and a scarf when going outside.  Avoid alcohol.  Obtain ongoing education and support as needed.  Participate in or seek rehabilitation as needed to maintain or improve independence and quality of life. SEEK MEDICAL CARE IF:   You have a rapid weight gain.  You  have increasing shortness of breath that is unusual for you.  You are unable to participate in your usual physical activities.  You tire easily.  You cough more than normal, especially with physical activity.  You have any or more swelling in areas such as your hands, feet, ankles, or abdomen.  You are unable to sleep because it is hard to breathe.  You feel like your heart is beating fast (palpitations).  You become dizzy or light-headed upon standing up. SEEK IMMEDIATE MEDICAL CARE IF:   You have difficulty breathing.  There is a change in mental status such as decreased alertness or difficulty with concentration.  You have a pain or discomfort in your chest.  You have an episode of fainting (syncope). MAKE SURE YOU:   Understand these instructions.  Will watch your condition.  Will get help right away if you are not doing well or get worse.   This information is not intended to replace advice given to you by your health care provider. Make sure you discuss any questions you have with your health care provider.   Document Released: 06/08/2005 Document Revised: 10/23/2014 Document Reviewed: 07/08/2012 Elsevier Interactive Patient Education 2016 Elsevier Inc.  Potassium Salts tablets, extended-release tablets or capsules What is this medicine? POTASSIUM (poe TASS i um) is  a natural salt that is important for the heart, muscles, and nerves. It is found in many foods and is normally supplied by a well balanced diet. This medicine is used to treat low potassium. This medicine may be used for other purposes; ask your health care provider or pharmacist if you have questions. What should I tell my health care provider before I take this medicine? They need to know if you have any of these conditions: -Addison's disease -dehydration -diabetes -difficulty swallowing -heart disease -history of high levels of potassium in the blood -irregular heartbeat -kidney  disease -recent severe burn -stomach ulcers or other stomach problems -an unusual or allergic reaction to potassium, tartrazine, other medicines, foods, dyes, or preservatives -pregnant or trying to get pregnant -breast-feeding How should I use this medicine? Take this medicine by mouth with a full glass of water. Take with food. Follow the directions on the prescription label. Do not suck on, crush, or chew this medicine. If you have difficulty swallowing, ask the pharmacist how to take. Take your medicine at regular intervals. Do not take it more often than directed. Do not stop taking except on your doctor's advice. Talk to your pediatrician regarding the use of this medicine in children. Special care may be needed. Overdosage: If you think you have taken too much of this medicine contact a poison control center or emergency room at once. NOTE: This medicine is only for you. Do not share this medicine with others. What if I miss a dose? If you miss a dose, take it as soon as you can. If it is almost time for your next dose, take only that dose. Do not take double or extra doses. What may interact with this medicine? Do not take this medicine with any of the following medications: -eplerenone -certain medicines for stomach problems like atropine; difenoxin and glycopyrrolate -sodium polystyrene sulfonate This medicine may also interact with the following medications: -certain medicines for blood pressure or heart disease like lisinopril, losartan, quinapril, valsartan -medicines for cold or allergies -NSAIDs, medicines for pain and inflammation, like ibuprofen or napoxen -other potassium supplements -salt substitutes -some diuretics This list may not describe all possible interactions. Give your health care provider a list of all the medicines, herbs, non-prescription drugs, or dietary supplements you use. Also tell them if you smoke, drink alcohol, or use illegal drugs. Some items may  interact with your medicine. What should I watch for while using this medicine? Visit your doctor or health care professional for regular check ups. You will need lab work done regularly. You may need to be on a special diet while taking this medicine. Ask your doctor. What side effects may I notice from receiving this medicine? Side effects that you should report to your doctor or health care professional as soon as possible: -allergic reactions like skin rash, itching or hives, swelling of the face, lips, or tongue -anxious -black, tarry stools -breathing problems -confusion -heartburn -irregular heartbeat -numbness or tingling in hands or feet -pain when swallowing -unusually weak or tired -weakness, heaviness of legs Side effects that usually do not require medical attention (report to your doctor or health care professional if they continue or are bothersome): -diarrhea -nausea -upset stomach -vomiting This list may not describe all possible side effects. Call your doctor for medical advice about side effects. You may report side effects to FDA at 1-800-FDA-1088. Where should I keep my medicine? Keep out of the reach of children. Store at room temperature between 15  and 30 degrees C (59 and 86 degrees F ). Keep bottle closed tightly to protect this medicine from light and moisture. Throw away any unused medicine after the expiration date. NOTE: This sheet is a summary. It may not cover all possible information. If you have questions about this medicine, talk to your doctor, pharmacist, or health care provider.    2016, Elsevier/Gold Standard. (2014-11-15 08:55:21)

## 2015-07-08 ENCOUNTER — Encounter: Payer: Self-pay | Admitting: Cardiovascular Disease

## 2015-07-08 ENCOUNTER — Ambulatory Visit (INDEPENDENT_AMBULATORY_CARE_PROVIDER_SITE_OTHER): Payer: Medicare Other | Admitting: Cardiovascular Disease

## 2015-07-08 VITALS — BP 144/118 | HR 106 | Ht 72.0 in | Wt 312.0 lb

## 2015-07-08 DIAGNOSIS — Z9114 Patient's other noncompliance with medication regimen: Secondary | ICD-10-CM

## 2015-07-08 DIAGNOSIS — I4891 Unspecified atrial fibrillation: Secondary | ICD-10-CM

## 2015-07-08 DIAGNOSIS — I10 Essential (primary) hypertension: Secondary | ICD-10-CM

## 2015-07-08 DIAGNOSIS — Z91148 Patient's other noncompliance with medication regimen for other reason: Secondary | ICD-10-CM

## 2015-07-08 DIAGNOSIS — I5033 Acute on chronic diastolic (congestive) heart failure: Secondary | ICD-10-CM

## 2015-07-08 DIAGNOSIS — Z87898 Personal history of other specified conditions: Secondary | ICD-10-CM

## 2015-07-08 DIAGNOSIS — Z9289 Personal history of other medical treatment: Secondary | ICD-10-CM

## 2015-07-08 MED ORDER — DILTIAZEM HCL ER COATED BEADS 360 MG PO CP24: 360.0000 mg | ORAL_CAPSULE | Freq: Every day | ORAL | Status: DC

## 2015-07-08 MED ORDER — TORSEMIDE 20 MG PO TABS
20.0000 mg | ORAL_TABLET | Freq: Two times a day (BID) | ORAL | Status: DC
Start: 2015-07-08 — End: 2015-09-04

## 2015-07-08 NOTE — Patient Instructions (Addendum)
   Increase Diltiazem CD to 360mg  daily  Stop Lasix.  Begin Torsemide 20mg  twice a day    New prescriptions sent to [facility] today on above. Continue all other medications.   Follow up next Wednesday - [place] office.

## 2015-07-08 NOTE — Progress Notes (Signed)
Patient ID: [name], male   DOB: Apr 21, 1966, 50 y.o.   MRN: [id number]      SUBJECTIVE: The patient presents for routine cardiac vascular follow-up. He was hospitalized for acute on chronic diastolic heart failure in December at [facility].  He underwent cardiac catheterization which demonstrated normal coronary arteries and hemodynamics were consistent with severe diastolic heart failure and secondary pulmonary hypertension. Anticoagulated with apixaban. Presented to ED earlier this month with increasing shortness of breath and was given IV Lasix in the ED.  He has a long history of medication noncompliance and my nurse has just told me that he has been noncompliant again and is markedly short of breath.  He says Eliquis makes him short of breath, spironolactone gave him an asthma attack, and Lipitor has led to nosebleeds. He is markedly short of breath and his legs are swollen.   Review of Systems: As per "subjective", otherwise negative.  Allergies  Allergen Reactions  . Bee Venom Shortness Of Breath and Swelling  . Aspirin Other (See Comments)    Chewable 81 mg tablets upsets patients stomach  . Banana Diarrhea  . Hydralazine Other (See Comments)    Patient states it causes Tachycardia    Current Outpatient Prescriptions  Medication Sig Dispense Refill  . aspirin EC 81 MG EC tablet Take 1 tablet (81 mg total) by mouth daily.    . beclomethasone (QVAR) 80 MCG/ACT inhaler Inhale 2 puffs into the lungs daily.     . Calcium Carbonate Antacid (ALKA-SELTZER ANTACID PO) Take 1 tablet by mouth as needed (upset stomach).    . cloNIDine (CATAPRES) 0.1 MG tablet Take 0.1-0.2 mg by mouth 2 (two) times daily.    Marland Kitchen diltiazem (CARDIZEM CD) 240 MG 24 hr capsule Take 1 capsule (240 mg total) by mouth every morning. 30 capsule 0  . furosemide (LASIX) 20 MG tablet Take 20 mg by mouth daily.   0  . metoprolol succinate (TOPROL-XL) 50 MG 24 hr tablet Take 1 tablet (50 mg total) by mouth daily.  Take with or immediately following a meal. 30 tablet 0  . apixaban (ELIQUIS) 5 MG TABS tablet Take 1 tablet (5 mg total) by mouth 2 (two) times daily. (Patient not taking: Reported on 07/08/2015) 60 tablet 0  . atorvastatin (LIPITOR) 80 MG tablet Take 1 tablet (80 mg total) by mouth daily at 6 PM. (Patient not taking: Reported on 07/08/2015) 30 tablet 0  . losartan (COZAAR) 50 MG tablet Take 1 tablet (50 mg total) by mouth daily. (Patient not taking: Reported on 07/08/2015) 30 tablet 0  . potassium chloride SA (K-DUR,KLOR-CON) 20 MEQ tablet Take 1 tablet (20 mEq total) by mouth 2 (two) times daily. (Patient not taking: Reported on 07/08/2015) 30 tablet 0  . spironolactone (ALDACTONE) 25 MG tablet Take 1 tablet (25 mg total) by mouth daily. (Patient not taking: Reported on 07/08/2015) 30 tablet 0   No current facility-administered medications for this visit.    Past Medical History  Diagnosis Date  . Hypertensive heart disease     Hypertensive heart disease with prior episodes of CHF  . Hyperlipidemia   . Hypertension   . Gastroesophageal reflux disease   . Osteoarthritis   . Peptic ulcer disease   . Morbid obesity (HCC)     /sleep apnea  . Allergic rhinitis     plus h/o asthma  . Tobacco abuse, in remission     Cigarettes discontinued in 2010  . CHF (congestive heart failure) (HCC)   .  A-fib (HCC)     2015  . Asthma     Past Surgical History  Procedure Laterality Date  . None    . Cardiac catheterization N/A 06/14/2015    Procedure: Right/Left Heart Cath and Coronary Angiography;  Surgeon: Runell Gess, MD;  Location: [facility] INVASIVE CV LAB;  Service: Cardiovascular;  Laterality: N/A;    Social History   Social History  . Marital Status: Divorced    Spouse Name: N/A  . Number of Children: 3  . Years of Education: N/A   Occupational History  . disabled    Social History Main Topics  . Smoking status: Former Smoker -- 0.50 packs/day for 20 years    Types: Cigarettes     Start date: 04/26/1988    Quit date: 06/23/2007  . Smokeless tobacco: Never Used     Comment: 1 ppd former smoker  . Alcohol Use: No     Comment: no etoh since 2009  . Drug Use: No  . Sexual Activity: No   Other Topics Concern  . Not on file   Social History Narrative     Filed Vitals:   07/08/15 1442  BP: 144/118  Pulse: 106  Height: 6' (1.829 m)  Weight: 312 lb (141.522 kg)  SpO2: 97%    PHYSICAL EXAM General: NAD, morbidly obese. HEENT: Wearing sunglasses. Neck: JVP difficult to assess. Lungs: Diminished sounds but no wheezes/rales. CV: Nondisplaced PMI. Irregular rhythm, tachycardic, normal S1/S2, no S3, no murmur.1+ pitting bilateral pretibial and periankle edema. Chronic venous stasis bilaterally.  Abdomen: Morbidly obese.  Neurologic: Alert and oriented.  Psych: Normal affect. Musculoskeletal: No gross deformities. Extremities: No clubbing or cyanosis.   ECG: Most recent ECG reviewed.     ASSESSMENT AND PLAN: 1. Acute on chronic diastolic heart failure: Noncompliant with meds including spironolactone, clonidine, and losartan. HR elevated. Will switch Lasix to torsemide 20 mg bid.  2. Atrial fibrillation: Noncompliant with Eliquis. HR elevated. Will increase Cardizem CD to 360 mg daily.  3. Malignant HTN: Markedly elevated DBP. Noncompliant with meds including spironolactone, clonidine, and losartan. Med adjustments as above.   Dispo: Patient is a danger to himself given medication noncompliance and repeat hospitalizations. Will try to initiate social services involvement. F/u with PA in [place] next week.  Prentice Docker, M.D., F.A.C.C.

## 2015-07-12 ENCOUNTER — Other Ambulatory Visit (HOSPITAL_COMMUNITY): Payer: Self-pay | Admitting: Nurse Practitioner

## 2015-07-12 ENCOUNTER — Ambulatory Visit (HOSPITAL_COMMUNITY)
Admission: RE | Admit: 2015-07-12 | Discharge: 2015-07-12 | Disposition: A | Discharge status: Home / Self Care | Payer: Medicare Other | Source: Ambulatory Visit | Attending: Nurse Practitioner | Admitting: Nurse Practitioner

## 2015-07-12 DIAGNOSIS — I509 Heart failure, unspecified: Secondary | ICD-10-CM | POA: Diagnosis not present

## 2015-07-12 DIAGNOSIS — I878 Other specified disorders of veins: Secondary | ICD-10-CM | POA: Insufficient documentation

## 2015-07-12 DIAGNOSIS — I517 Cardiomegaly: Secondary | ICD-10-CM | POA: Diagnosis not present

## 2015-07-17 ENCOUNTER — Encounter: Payer: Self-pay | Admitting: Cardiology

## 2015-07-17 ENCOUNTER — Ambulatory Visit: Payer: Medicare Other | Admitting: Cardiology

## 2015-07-18 ENCOUNTER — Encounter: Payer: Self-pay | Admitting: Adult Health

## 2015-07-18 ENCOUNTER — Ambulatory Visit (INDEPENDENT_AMBULATORY_CARE_PROVIDER_SITE_OTHER): Payer: Medicare Other | Admitting: Adult Health

## 2015-07-18 VITALS — BP 160/100 | HR 57 | Ht 72.0 in | Wt 299.0 lb

## 2015-07-18 DIAGNOSIS — I10 Essential (primary) hypertension: Secondary | ICD-10-CM | POA: Diagnosis not present

## 2015-07-18 DIAGNOSIS — Z9114 Patient's other noncompliance with medication regimen: Secondary | ICD-10-CM | POA: Diagnosis not present

## 2015-07-18 DIAGNOSIS — I482 Chronic atrial fibrillation, unspecified: Secondary | ICD-10-CM

## 2015-07-18 DIAGNOSIS — Z91199 Patient's noncompliance with other medical treatment and regimen due to unspecified reason: Secondary | ICD-10-CM

## 2015-07-18 DIAGNOSIS — Z9111 Patient's noncompliance with dietary regimen: Secondary | ICD-10-CM

## 2015-07-18 NOTE — Progress Notes (Signed)
Cardiology Office Note   Date:  07/18/2015   ID:  [name], DOB 07/18/1965, MRN [id number]  PCP:  [name]  Cardiologist: Inis Sizer, NP   Chief Complaint  Patient presents with  . Hypertension  . Atrial Fibrillation      History of Present Illness: [name] is a 50 y.o. male who presents for ongoing assessment and management of severe diastolic dysfunction, with diastolic CHF, and pulmonary hypertension,atrial fibrillation. CHADS VASC Score of 2.  Patient has a long history of medication noncompliance.  On last office visit on 07/08/2015, he was again found to be decompensated, he was not taking spironolactone, clonidine, Eliquis, or losartan.  His heart rate was elevated. His Lasix was switched to torsemide 20 mg twice a day, diltiazem was increased to 360 mg daily.  He was referred to social services secondary to frequent admissions and noncompliant issues.  And today, and hypertensive, and admittedly is not taking his medications as directed.  He states that he had too many side effects.  He states that it made him feel bad, he was having nosebleeds, and nausea.  He was not certain, which medications were causing this, so.  He stopped them all.  He states that he sometimes takes them, but has not take them as directed.  He is only taking his torsemide once a day instead of twice a day, he takes metoprolol, a couple times a week, and he stopped taking ELIQUIS altogether.  Past Medical History  Diagnosis Date  . Hypertensive heart disease     Hypertensive heart disease with prior episodes of CHF  . Hyperlipidemia   . Hypertension   . Gastroesophageal reflux disease   . Osteoarthritis   . Peptic ulcer disease   . Morbid obesity (HCC)     /sleep apnea  . Allergic rhinitis     plus h/o asthma  . Tobacco abuse, in remission     Cigarettes discontinued in 2010  . CHF (congestive heart failure) (HCC)   . A-fib (HCC)     2015  .  Asthma     Past Surgical History  Procedure Laterality Date  . None    . Cardiac catheterization N/A 06/14/2015    Procedure: Right/Left Heart Cath and Coronary Angiography;  Surgeon: Runell Gess, MD;  Location: [facility] INVASIVE CV LAB;  Service: Cardiovascular;  Laterality: N/A;     Current Outpatient Prescriptions  Medication Sig Dispense Refill  . aspirin EC 81 MG EC tablet Take 1 tablet (81 mg total) by mouth daily.    . beclomethasone (QVAR) 80 MCG/ACT inhaler Inhale 2 puffs into the lungs daily.     . Calcium Carbonate Antacid (ALKA-SELTZER ANTACID PO) Take 1 tablet by mouth as needed (upset stomach).    . cloNIDine (CATAPRES) 0.1 MG tablet Take 0.1-0.2 mg by mouth 2 (two) times daily.    Marland Kitchen diltiazem (CARDIZEM CD) 360 MG 24 hr capsule Take 1 capsule (360 mg total) by mouth daily. 30 capsule 6  . metoprolol succinate (TOPROL-XL) 50 MG 24 hr tablet Take 1 tablet (50 mg total) by mouth daily. Take with or immediately following a meal. 30 tablet 0  . torsemide (DEMADEX) 20 MG tablet Take 1 tablet (20 mg total) by mouth 2 (two) times daily. 60 tablet 6  . apixaban (ELIQUIS) 5 MG TABS tablet Take 1 tablet (5 mg total) by mouth 2 (two) times daily. (Patient not taking: Reported on 07/08/2015) 60 tablet 0  .  atorvastatin (LIPITOR) 80 MG tablet Take 1 tablet (80 mg total) by mouth daily at 6 PM. (Patient not taking: Reported on 07/08/2015) 30 tablet 0  . losartan (COZAAR) 50 MG tablet Take 1 tablet (50 mg total) by mouth daily. (Patient not taking: Reported on 07/08/2015) 30 tablet 0  . potassium chloride SA (K-DUR,KLOR-CON) 20 MEQ tablet Take 1 tablet (20 mEq total) by mouth 2 (two) times daily. (Patient not taking: Reported on 07/18/2015) 30 tablet 0  . spironolactone (ALDACTONE) 25 MG tablet Take 1 tablet (25 mg total) by mouth daily. (Patient not taking: Reported on 07/18/2015) 30 tablet 0   No current facility-administered medications for this visit.    Allergies:   Bee venom; Aspirin;  Banana; and Hydralazine    Social History:  The patient  reports that he quit smoking about 8 years ago. His smoking use included Cigarettes. He started smoking about 27 years ago. He has a 10 pack-year smoking history. He has never used smokeless tobacco. He reports that he does not drink alcohol or use illicit drugs.   Family History:  The patient's family history is negative for Colon cancer, Inflammatory bowel disease, and Liver disease.    ROS: All other systems are reviewed and negative. Unless otherwise mentioned in H&P    PHYSICAL EXAM: VS:  BP 160/100 mmHg  Pulse 57  Ht 6' (1.829 m)  Wt 299 lb (135.626 kg)  BMI 40.54 kg/m2  SpO2 99% , BMI Body mass index is 40.54 kg/(m^2). GEN: Well nourished, well developed, in no acute distress HEENT: normalhe is wearing sunglasses, and does not wish to take them off. Neck: no JVD, carotid bruits, or masses Cardiac: IRRR;tachycardic. no murmurs, rubs, or gallops,1+  edema  Respiratory:  clear to auscultation bilaterally, normal work of breathing GI: soft, nontender, nondistended, + BS MS: no deformity or atrophy Skin: warm and dry, no rash Neuro:  Strength and sensation are intact Psych: euthymic mood, full affect  Recent Labs: 05/28/2015: Magnesium 1.8 07/04/2015: ALT 22; B Natriuretic Peptide 601.0*; BUN 22*; Creatinine, Ser 1.09; Hemoglobin 12.3*; Platelets 163; Potassium 3.1*; Sodium 142    Lipid Panel    Component Value Date/Time   CHOL 139 06/14/2015 0328   TRIG 58 06/14/2015 0328   HDL 28* 06/14/2015 0328   CHOLHDL 5.0 06/14/2015 0328   VLDL 12 06/14/2015 0328   LDLCALC 99 06/14/2015 0328      Wt Readings from Last 3 Encounters:  07/18/15 299 lb (135.626 kg)  07/08/15 312 lb (141.522 kg)  07/04/15 333 lb (151.048 kg)     ASSESSMENT AND PLAN:  1. Atrial fib with RVR: heart rate is not well controlled currently.  By auscultation his heart rate was 110.  The patient has not taken his metoprolol today.  He also is not  taking any diuretic and he is not taking his ELIQUIS.  I have spent a lengthy amount of time explaining to him the necessity of keeping his heart rate control and also is in need to take ELIQUIS to prevent CVA.  He appears to understand, but is reluctant anyway, to restart these medicines.  I have negotiated with him to at least be on them a week, so that we can see if they are working for him, and then discuss any side effects, which may occur in one week and on followup.  2. Hypertension:he is not taking diuretics as directed, or clonidine twice a day as directed.  I have explained to him the need to  keep his blood pressure and control to prevent CVA and renal damage.  Also, if he is going to take ELIQUIS, hypertension, can cause some cerebral bleeding.  He has become anxious.  Hearing.  This.  He swelling to take his medicines daily for a week as directed.  We will see him on followup to ascertain whether medications are keeping his blood pressure under control.  3. Medical noncompliance:he has a history of this.  I tried to negotiate with him about compliance issues and the need to keep his heart rate and blood pressure better controlled, to prevent strokes, and or rehospitalization.  He states he will try this for a week.  I am uncertain how long this will last.   Current medicines are reviewed at length with the patient today.    Labs/ tests ordered today include:  No orders of the defined types were placed in this encounter.     Disposition:   FU with one week.   Signed, Joni Reining, NP  07/18/2015 3:37 PM    [facility] [address], [place], [place] [address] Phone: [phone]; Fax: [phone]

## 2015-07-18 NOTE — Patient Instructions (Signed)
Your physician recommends that you schedule a follow-up appointment in: 1 Week with Joni Reining, NP  Your physician recommends that you continue on your current medications as directed. Please refer to the Current Medication list given to you today.  If you need a refill on your cardiac medications before your next appointment, please call your pharmacy.  Thank you for choosing [facility]!

## 2015-07-18 NOTE — Progress Notes (Deleted)
Name: [name]    DOB: 06/13/1966  Age: 50 y.o.  MR#: [id number]       PCP:  [name]      Insurance: Payor: BLUE [employer] MEDICARE / Plan: BCBS MEDICARE / Product Type: *No Product type* /   CC:   No chief complaint on file.   VS Filed Vitals:   07/18/15 1453  BP: 160/100  Pulse: 57  Height: 6' (1.829 m)  Weight: 299 lb (135.626 kg)  SpO2: 99%    Weights Current Weight  07/18/15 299 lb (135.626 kg)  07/08/15 312 lb (141.522 kg)  07/04/15 333 lb (151.048 kg)    Blood Pressure  BP Readings from Last 3 Encounters:  07/18/15 160/100  07/08/15 144/118  07/04/15 161/116     Admit date:  (Not on file) Last encounter with RMR:  Visit date not found   Allergy Bee venom; Aspirin; Banana; and Hydralazine  Current Outpatient Prescriptions  Medication Sig Dispense Refill  . aspirin EC 81 MG EC tablet Take 1 tablet (81 mg total) by mouth daily.    . beclomethasone (QVAR) 80 MCG/ACT inhaler Inhale 2 puffs into the lungs daily.     . Calcium Carbonate Antacid (ALKA-SELTZER ANTACID PO) Take 1 tablet by mouth as needed (upset stomach).    . cloNIDine (CATAPRES) 0.1 MG tablet Take 0.1-0.2 mg by mouth 2 (two) times daily.    Marland Kitchen diltiazem (CARDIZEM CD) 360 MG 24 hr capsule Take 1 capsule (360 mg total) by mouth daily. 30 capsule 6  . metoprolol succinate (TOPROL-XL) 50 MG 24 hr tablet Take 1 tablet (50 mg total) by mouth daily. Take with or immediately following a meal. 30 tablet 0  . torsemide (DEMADEX) 20 MG tablet Take 1 tablet (20 mg total) by mouth 2 (two) times daily. 60 tablet 6  . apixaban (ELIQUIS) 5 MG TABS tablet Take 1 tablet (5 mg total) by mouth 2 (two) times daily. (Patient not taking: Reported on 07/08/2015) 60 tablet 0  . atorvastatin (LIPITOR) 80 MG tablet Take 1 tablet (80 mg total) by mouth daily at 6 PM. (Patient not taking: Reported on 07/08/2015) 30 tablet 0  . losartan (COZAAR) 50 MG tablet Take 1 tablet (50 mg total) by mouth daily.  (Patient not taking: Reported on 07/08/2015) 30 tablet 0  . potassium chloride SA (K-DUR,KLOR-CON) 20 MEQ tablet Take 1 tablet (20 mEq total) by mouth 2 (two) times daily. (Patient not taking: Reported on 07/18/2015) 30 tablet 0  . spironolactone (ALDACTONE) 25 MG tablet Take 1 tablet (25 mg total) by mouth daily. (Patient not taking: Reported on 07/18/2015) 30 tablet 0   No current facility-administered medications for this visit.    Discontinued Meds:   There are no discontinued medications.  Patient Active Problem List   Diagnosis Date Noted  . Dyspnea on exertion 05/28/2015  . Acute on chronic diastolic CHF (congestive heart failure), NYHA class 1 (HCC) 05/28/2015  . Atrial fibrillation (HCC)   . Acute respiratory failure with hypoxia (HCC) 04/01/2015  . Pneumonia 04/01/2015  . Hypokalemia 04/01/2015  . Chronic systolic CHF (congestive heart failure) (HCC)   . Chronic diastolic CHF (congestive heart failure) (HCC)   . Hypertensive urgency 08/08/2014  . Obesity hypoventilation syndrome (HCC) 08/08/2014  . GERD (gastroesophageal reflux disease) 08/08/2014  . Shortness of breath 08/08/2014  . CHF, acute on chronic (HCC) 06/28/2014  . Noncompliance with medication regimen 06/28/2014  . Atrial fibrillation with RVR (HCC) 06/28/2014  .  Acute on chronic diastolic CHF (congestive heart failure) (HCC) 06/28/2014  . SOB (shortness of breath)   . Edema of right lower extremity 06/21/2014  . Fecal urgency 06/21/2014  . CAP (community acquired pneumonia) 04/04/2014  . A-fib (HCC) 04/03/2014  . CHF (congestive heart failure) (HCC) 04/02/2014  . Asthma 02/11/2009  . Sleep apnea 02/11/2009  . Hyperlipidemia 07/12/2008  . OBESITY, MORBID 07/12/2008  . Anxiety state 07/12/2008  . DEPRESSION 07/12/2008  . Essential hypertension 07/12/2008  . CONGESTIVE HEART FAILURE 07/12/2008  . ALLERGIC RHINITIS 07/12/2008  . GERD 07/12/2008  . Peptic ulcer 07/12/2008  . OSTEOARTHRITIS 07/12/2008     LABS    Component Value Date/Time   NA 142 07/04/2015 0020   NA 141 06/17/2015 0525   NA 141 06/16/2015 0412   K 3.1* 07/04/2015 0020   K 3.8 06/17/2015 0525   K 3.5 06/16/2015 0412   CL 110 07/04/2015 0020   CL 104 06/17/2015 0525   CL 106 06/16/2015 0412   CO2 25 07/04/2015 0020   CO2 30 06/17/2015 0525   CO2 26 06/16/2015 0412   GLUCOSE 108* 07/04/2015 0020   GLUCOSE 90 06/17/2015 0525   GLUCOSE 93 06/16/2015 0412   BUN 22* 07/04/2015 0020   BUN 21* 06/17/2015 0525   BUN 16 06/16/2015 0412   CREATININE 1.09 07/04/2015 0020   CREATININE 1.42* 06/17/2015 0525   CREATININE 1.47* 06/16/2015 0412   CREATININE 1.02 07/07/2011 0909   CREATININE 1.00 01/05/2011 0840   CALCIUM 8.5* 07/04/2015 0020   CALCIUM 8.9 06/17/2015 0525   CALCIUM 8.8* 06/16/2015 0412   GFRNONAA >60 07/04/2015 0020   GFRNONAA 58* 06/17/2015 0525   GFRNONAA 55* 06/16/2015 0412   GFRAA >60 07/04/2015 0020   GFRAA >60 06/17/2015 0525   GFRAA >60 06/16/2015 0412   CMP     Component Value Date/Time   NA 142 07/04/2015 0020   K 3.1* 07/04/2015 0020   CL 110 07/04/2015 0020   CO2 25 07/04/2015 0020   GLUCOSE 108* 07/04/2015 0020   BUN 22* 07/04/2015 0020   CREATININE 1.09 07/04/2015 0020   CREATININE 1.02 07/07/2011 0909   CALCIUM 8.5* 07/04/2015 0020   PROT 6.2* 07/04/2015 0020   ALBUMIN 3.6 07/04/2015 0020   AST 24 07/04/2015 0020   ALT 22 07/04/2015 0020   ALKPHOS 57 07/04/2015 0020   BILITOT 1.7* 07/04/2015 0020   GFRNONAA >60 07/04/2015 0020   GFRAA >60 07/04/2015 0020       Component Value Date/Time   WBC 4.3 07/04/2015 0020   WBC 3.3* 06/16/2015 0412   WBC 3.4* 06/15/2015 0432   HGB 12.3* 07/04/2015 0020   HGB 13.4 06/16/2015 0412   HGB 12.6* 06/15/2015 0432   HCT 37.2* 07/04/2015 0020   HCT 40.4 06/16/2015 0412   HCT 39.1 06/15/2015 0432   MCV 86.5 07/04/2015 0020   MCV 86.9 06/16/2015 0412   MCV 87.5 06/15/2015 0432    Lipid Panel     Component Value Date/Time   CHOL  139 06/14/2015 0328   TRIG 58 06/14/2015 0328   HDL 28* 06/14/2015 0328   CHOLHDL 5.0 06/14/2015 0328   VLDL 12 06/14/2015 0328   LDLCALC 99 06/14/2015 0328    ABG    Component Value Date/Time   PHART 7.443 06/14/2015 0816   PCO2ART 36.5 06/14/2015 0816   PO2ART 69.0* 06/14/2015 0816   HCO3 24.9* 06/14/2015 0816   TCO2 26 06/14/2015 0816   O2SAT 94.0 06/14/2015 0816  Lab Results  Component Value Date   TSH 0.322* 04/02/2014   BNP (last 3 results)  Recent Labs  05/28/15 0150 06/13/15 0958 07/04/15 0020  BNP 423.0* 531.0* 601.0*    ProBNP (last 3 results) No results for input(s): PROBNP in the last 8760 hours.  Cardiac Panel (last 3 results) No results for input(s): CKTOTAL, CKMB, TROPONINI, RELINDX in the last 72 hours.  Iron/TIBC/Ferritin/ %Sat No results found for: IRON, TIBC, FERRITIN, IRONPCTSAT   EKG Orders placed or performed during the hospital encounter of 07/04/15  . EKG 12-Lead  . EKG 12-Lead  . ED EKG within 10 minutes  . ED EKG within 10 minutes  . EKG     Prior Assessment and Plan Problem List as of 07/18/2015      Cardiovascular and Mediastinum   Essential hypertension   Last Assessment & Plan 05/07/2014 Office Visit Written 05/07/2014  4:32 PM by Jodelle Gross, NP    He continues to be non-compliant with medications. Does not take every day, and did not take today. He states it makes him feel weak and tired. Not sure if he has OSA, but certainly had body habitus for thiDue his complaints of fatigue and slow HR, I will decrease metoprolol to Toprol XL 25 mg at HS. I will increase lisinopril to 10 mg BID. Will leave him on diltiazem 120 mg in am with lasix.   He is to follow up in the [place] office as this is closer for him, and what he requests. Will need follow up BMET on next visit with Dr. Purvis Sheffield.       CONGESTIVE HEART FAILURE   Last Assessment & Plan 05/06/2011 Office Visit Written 05/06/2011  1:04 PM by Kathlen Brunswick, MD     No current signs or symptoms of congestive heart failure.      CHF (congestive heart failure) [facility])   Last Assessment & Plan 05/07/2014 Office Visit Written 05/07/2014  4:33 PM by Jodelle Gross, NP    Some evidence of LEE. May be related to diltiazem. Lungs are clear. WT is down 22 lbs since hospitalization. Follow up in one month.       A-fib (HCC)   CHF, acute on chronic (HCC)   Atrial fibrillation with RVR (HCC)   Acute on chronic diastolic CHF (congestive heart failure) (HCC)   Hypertensive urgency   Chronic systolic CHF (congestive heart failure) (HCC)   Chronic diastolic CHF (congestive heart failure) (HCC)   Atrial fibrillation (HCC)   Acute on chronic diastolic CHF (congestive heart failure), NYHA class 1 (HCC)     Respiratory   ALLERGIC RHINITIS   Asthma   Last Assessment & Plan 11/28/2010 Office Visit Written 11/28/2010  3:49 PM by Kathlen Brunswick, MD    Patient has required an inhaler for treatment of an exacerbation of his asthma in recent months.  This is another reason to discontinue beta blocker treatment.      CAP (community acquired pneumonia)   Acute respiratory failure with hypoxia [facility])   Pneumonia     Digestive   GERD   Last Assessment & Plan 06/21/2014 Office Visit Written 06/21/2014  3:28 PM by Tiffany Kocher, PA-C    Vague anorexia, weight loss, heartburn. We'll likely offer upper endoscopy for further evaluation however at this time start PPI therapy and await lower extremity Doppler ultrasound findings. Further recommendations to follow.      Peptic ulcer   Last Assessment & Plan 05/07/2014 Office Visit  Written 05/07/2014  4:35 PM by Jodelle Gross, NP    He is recommended to see GI specialist to continue treatment. He had been on PPI, but has not been seen by GI, he states. I have given him the names of local GI physicians in [place], He lives in [place] and names of GI specialists there will be provided. He will follow up on his own,.        GERD (gastroesophageal reflux disease)     Musculoskeletal and Integument   OSTEOARTHRITIS     Other   Hyperlipidemia   Last Assessment & Plan 11/28/2010 Office Visit Written 11/28/2010  3:47 PM by Kathlen Brunswick, MD    Most recent lipid profile in 2010 was somewhat suboptimal, but adequate in light of the patient's absence of known vascular disease.  A repeat study will be obtained.      OBESITY, MORBID   Last Assessment & Plan 05/06/2011 Office Visit Written 05/06/2011  1:09 PM by Kathlen Brunswick, MD    Patient congratulated on 6 pound weight loss and encouraged to continue to restrict calories and to be as active as possible.      Anxiety state   DEPRESSION   Sleep apnea   Last Assessment & Plan 10/08/2011 Office Visit Written 10/08/2011  2:39 PM by Kathlen Brunswick, MD    Patient would likely benefit from a CPAP device, but does not appear motivated to obtain one.      Edema of right lower extremity   Last Assessment & Plan 06/21/2014 Office Visit Written 06/21/2014  3:26 PM by Tiffany Kocher, PA-C    50 year old gentleman with history of chronic lower extremity edema with current right greater than left swelling. History of atrial fibrillation with irregular rhythm on exam today. Patient deemed not a candidate for anticoagulation due to his medical noncompliance. Obtain venous ultrasound to rule out DVT.       Fecal urgency   Last Assessment & Plan 06/21/2014 Office Visit Written 06/21/2014  3:27 PM by Tiffany Kocher, PA-C    One month history of postprandial fecal urgency without diarrhea. No prior colonoscopy. Given he is having solid stools, no indication for stool studies. Will offer him a colonoscopy in the near future however await lower extremity ultrasound findings first.      Noncompliance with medication regimen   SOB (shortness of breath)   Obesity hypoventilation syndrome (HCC)   Shortness of breath   Hypokalemia   Dyspnea on exertion       Imaging: Dg  Chest 2 View  07/12/2015  CLINICAL DATA:  Heart failure, unspecified EXAM: CHEST  2 VIEW COMPARISON:  07/04/2015 FINDINGS: Stable cardiomegaly. There is pulmonary venous congestion without edema or effusion. Stable aortic and hilar contours. IMPRESSION: Cardiomegaly and mild venous congestion. Electronically Signed   By: Marnee Spring M.D.   On: 07/12/2015 13:23   Dg Chest 2 View  07/04/2015  CLINICAL DATA:  50 year old male with chest pain and shortness of breath EXAM: CHEST  2 VIEW COMPARISON:  Radiograph dated 06/13/2015 FINDINGS: There is stable cardiomegaly. There is central vascular and and bibasilar interstitial prominence similar to prior study likely representing a degree of congestion or edema. Superimposed pneumonia is not excluded. There is no focal consolidation, pleural effusion, pneumothorax. The osseous structures appear unremarkable. IMPRESSION: Cardiomegaly with congestive changes versus edema. No focal consolidation. Electronically Signed   By: Elgie Collard M.D.   On: 07/04/2015 02:11

## 2015-07-26 ENCOUNTER — Encounter: Payer: Medicare Other | Admitting: Adult Health

## 2015-07-26 NOTE — Progress Notes (Signed)
Cardiology Office Note   Date:  07/26/2015  ERROR NO SHOW

## 2015-08-02 ENCOUNTER — Emergency Department (HOSPITAL_COMMUNITY)
Admission: EM | Admit: 2015-08-02 | Discharge: 2015-08-02 | Disposition: A | Discharge status: Home / Self Care | Payer: Medicare Other | Attending: Emergency Medicine | Admitting: Emergency Medicine

## 2015-08-02 ENCOUNTER — Emergency Department (HOSPITAL_COMMUNITY): Payer: Medicare Other

## 2015-08-02 ENCOUNTER — Encounter (HOSPITAL_COMMUNITY): Payer: Self-pay | Admitting: Emergency Medicine

## 2015-08-02 DIAGNOSIS — J159 Unspecified bacterial pneumonia: Secondary | ICD-10-CM | POA: Insufficient documentation

## 2015-08-02 DIAGNOSIS — J45901 Unspecified asthma with (acute) exacerbation: Secondary | ICD-10-CM | POA: Diagnosis not present

## 2015-08-02 DIAGNOSIS — E785 Hyperlipidemia, unspecified: Secondary | ICD-10-CM | POA: Insufficient documentation

## 2015-08-02 DIAGNOSIS — R600 Localized edema: Secondary | ICD-10-CM | POA: Insufficient documentation

## 2015-08-02 DIAGNOSIS — Z87891 Personal history of nicotine dependence: Secondary | ICD-10-CM | POA: Insufficient documentation

## 2015-08-02 DIAGNOSIS — Z8719 Personal history of other diseases of the digestive system: Secondary | ICD-10-CM | POA: Diagnosis not present

## 2015-08-02 DIAGNOSIS — I509 Heart failure, unspecified: Secondary | ICD-10-CM | POA: Diagnosis not present

## 2015-08-02 DIAGNOSIS — Z7982 Long term (current) use of aspirin: Secondary | ICD-10-CM | POA: Insufficient documentation

## 2015-08-02 DIAGNOSIS — Z9889 Other specified postprocedural states: Secondary | ICD-10-CM | POA: Diagnosis not present

## 2015-08-02 DIAGNOSIS — Z8711 Personal history of peptic ulcer disease: Secondary | ICD-10-CM | POA: Insufficient documentation

## 2015-08-02 DIAGNOSIS — Z79899 Other long term (current) drug therapy: Secondary | ICD-10-CM | POA: Insufficient documentation

## 2015-08-02 DIAGNOSIS — J189 Pneumonia, unspecified organism: Secondary | ICD-10-CM

## 2015-08-02 DIAGNOSIS — I4891 Unspecified atrial fibrillation: Secondary | ICD-10-CM | POA: Diagnosis not present

## 2015-08-02 DIAGNOSIS — M1990 Unspecified osteoarthritis, unspecified site: Secondary | ICD-10-CM | POA: Insufficient documentation

## 2015-08-02 DIAGNOSIS — Z7951 Long term (current) use of inhaled steroids: Secondary | ICD-10-CM | POA: Diagnosis not present

## 2015-08-02 DIAGNOSIS — I110 Hypertensive heart disease with heart failure: Secondary | ICD-10-CM | POA: Insufficient documentation

## 2015-08-02 DIAGNOSIS — R0602 Shortness of breath: Secondary | ICD-10-CM | POA: Diagnosis present

## 2015-08-02 DIAGNOSIS — I499 Cardiac arrhythmia, unspecified: Secondary | ICD-10-CM | POA: Diagnosis not present

## 2015-08-02 LAB — CBC
HEMATOCRIT: 37.5 % — AB (ref 39.0–52.0)
HEMOGLOBIN: 12.3 g/dL — AB (ref 13.0–17.0)
MCH: 28.5 pg (ref 26.0–34.0)
MCHC: 32.8 g/dL (ref 30.0–36.0)
MCV: 87 fL (ref 78.0–100.0)
Platelets: 193 10*3/uL (ref 150–400)
RBC: 4.31 MIL/uL (ref 4.22–5.81)
RDW: 14.4 % (ref 11.5–15.5)
WBC: 6.3 10*3/uL (ref 4.0–10.5)

## 2015-08-02 LAB — BASIC METABOLIC PANEL
ANION GAP: 9 (ref 5–15)
BUN: 23 mg/dL — AB (ref 6–20)
CHLORIDE: 107 mmol/L (ref 101–111)
CO2: 27 mmol/L (ref 22–32)
Calcium: 8.6 mg/dL — ABNORMAL LOW (ref 8.9–10.3)
Creatinine, Ser: 1.28 mg/dL — ABNORMAL HIGH (ref 0.61–1.24)
GFR calc Af Amer: >60 mL/min (ref >60)
GFR calc non Af Amer: >60 mL/min (ref >60)
GLUCOSE: 90 mg/dL (ref 65–99)
POTASSIUM: 3.1 mmol/L — AB (ref 3.5–5.1)
SODIUM: 143 mmol/L (ref 135–145)

## 2015-08-02 LAB — TROPONIN I
TROPONIN I: 0.04 ng/mL — AB (ref <0.031)
Troponin I: 0.05 ng/mL — ABNORMAL HIGH (ref <0.031)

## 2015-08-02 LAB — BRAIN NATRIURETIC PEPTIDE: B Natriuretic Peptide: 756 pg/mL — ABNORMAL HIGH (ref 0.0–100.0)

## 2015-08-02 LAB — PROTIME-INR
INR: 1.35 (ref 0.00–1.49)
PROTHROMBIN TIME: 16.8 s — AB (ref 11.6–15.2)

## 2015-08-02 MED ORDER — DILTIAZEM HCL ER COATED BEADS 180 MG PO CP24
360.0000 mg | ORAL_CAPSULE | Freq: Once | ORAL | Status: AC
  Administered 2015-08-02: 360 mg via ORAL
  Filled 2015-08-02: qty 2

## 2015-08-02 MED ORDER — POTASSIUM CHLORIDE CRYS ER 20 MEQ PO TBCR
60.0000 meq | EXTENDED_RELEASE_TABLET | Freq: Once | ORAL | Status: AC
  Administered 2015-08-02: 60 meq via ORAL
  Filled 2015-08-02: qty 3

## 2015-08-02 MED ORDER — FUROSEMIDE 10 MG/ML IJ SOLN
80.0000 mg | Freq: Once | INTRAMUSCULAR | Status: AC
  Administered 2015-08-02: 80 mg via INTRAVENOUS
  Filled 2015-08-02: qty 8

## 2015-08-02 MED ORDER — VANCOMYCIN HCL IN DEXTROSE 1-5 GM/200ML-% IV SOLN
1000.0000 mg | Freq: Once | INTRAVENOUS | Status: AC
  Administered 2015-08-02: 1000 mg via INTRAVENOUS
  Filled 2015-08-02: qty 200

## 2015-08-02 MED ORDER — LEVOFLOXACIN 500 MG PO TABS: 500.0000 mg | ORAL_TABLET | Freq: Every day | ORAL | Status: DC

## 2015-08-02 MED ORDER — SPIRONOLACTONE 25 MG PO TABS
25.0000 mg | ORAL_TABLET | Freq: Every day | ORAL | Status: DC
  Administered 2015-08-02: 25 mg via ORAL
  Filled 2015-08-02 (×4): qty 1

## 2015-08-02 MED ORDER — LOSARTAN POTASSIUM 50 MG PO TABS
50.0000 mg | ORAL_TABLET | Freq: Once | ORAL | Status: AC
  Administered 2015-08-02: 50 mg via ORAL
  Filled 2015-08-02: qty 1

## 2015-08-02 MED ORDER — PIPERACILLIN-TAZOBACTAM 3.375 G IVPB 30 MIN
3.3750 g | Freq: Once | INTRAVENOUS | Status: AC
  Administered 2015-08-02: 3.375 g via INTRAVENOUS
  Filled 2015-08-02: qty 50

## 2015-08-02 NOTE — ED Provider Notes (Signed)
CSN: [id number]     Arrival date & time 08/02/15  1116 History  By signing my name below, I, [name], attest that this documentation has been prepared under the direction and in the presence of Samuel Razor, MD. Electronically Signed: Marica Fitzpatrick, ED Scribe. 08/02/2015. 12:46 PM.  Chief Complaint  Patient presents with  . Shortness of Breath   The history is provided by the patient. No language interpreter was used.   PCP: [name] HPI Comments: [name] is a 50 y.o. male, with PMHx noted below including atrial fibrillation, diastolic dysfunction, CHF, asthma and pulmonary hypertension, who presents to the Emergency Department complaining of severe SOB onset yesterday. Associated Sx include "irregular heartbeat" and intermittent cough. Pt reports he has been compliant with all of his daily meds. Does not weigh himself regularly but thinks last weight 2-3w weeks ago "was 295 to 300 lbs." Pt denies fever, chills, or any other Sx at this time.   Past Medical History  Diagnosis Date  . Hypertensive heart disease     Hypertensive heart disease with prior episodes of CHF  . Hyperlipidemia   . Hypertension   . Gastroesophageal reflux disease   . Osteoarthritis   . Peptic ulcer disease   . Morbid obesity (HCC)     /sleep apnea  . Allergic rhinitis     plus h/o asthma  . Tobacco abuse, in remission     Cigarettes discontinued in 2010  . CHF (congestive heart failure) (HCC)   . A-fib (HCC)     2015  . Asthma    Past Surgical History  Procedure Laterality Date  . None    . Cardiac catheterization N/A 06/14/2015    Procedure: Right/Left Heart Cath and Coronary Angiography;  Surgeon: Runell Gess, MD;  Location: [facility] INVASIVE CV LAB;  Service: Cardiovascular;  Laterality: N/A;   Family History  Problem Relation Age of Onset  . Colon cancer Neg Hx   . Inflammatory bowel disease Neg Hx   . Liver disease Neg Hx    Social History  Substance Use Topics   . Smoking status: Former Smoker -- 0.50 packs/day for 20 years    Types: Cigarettes    Start date: 04/26/1988    Quit date: 06/23/2007  . Smokeless tobacco: Never Used     Comment: 1 ppd former smoker  . Alcohol Use: No     Comment: no etoh since 2009    Review of Systems  Constitutional: Negative for fever and chills.  Respiratory: Positive for cough and shortness of breath.   Cardiovascular: Negative for leg swelling.       "irregular heartbeat"   All other systems reviewed and are negative.     Allergies  Bee venom; Aspirin; Banana; and Hydralazine  Home Medications   Prior to Admission medications   Medication Sig Start Date End Date Taking? Authorizing Provider  apixaban (ELIQUIS) 5 MG TABS tablet Take 1 tablet (5 mg total) by mouth 2 (two) times daily. Patient not taking: Reported on 07/08/2015 06/17/15   [name], MD  aspirin EC 81 MG EC tablet Take 1 tablet (81 mg total) by mouth daily. 06/17/15   [name], MD  atorvastatin (LIPITOR) 80 MG tablet Take 1 tablet (80 mg total) by mouth daily at 6 PM. Patient not taking: Reported on 07/08/2015 06/17/15   [name], MD  beclomethasone (QVAR) 80 MCG/ACT inhaler Inhale 2 puffs into the lungs daily.  Historical Provider, MD  Calcium Carbonate Antacid (ALKA-SELTZER ANTACID PO) Take 1 tablet by mouth as needed (upset stomach).    Historical Provider, MD  cloNIDine (CATAPRES) 0.1 MG tablet Take 0.1-0.2 mg by mouth 2 (two) times daily.    Historical Provider, MD  diltiazem (CARDIZEM CD) 360 MG 24 hr capsule Take 1 capsule (360 mg total) by mouth daily. 07/08/15   [name], MD  losartan (COZAAR) 50 MG tablet Take 1 tablet (50 mg total) by mouth daily. Patient not taking: Reported on 07/08/2015 06/17/15   [name], MD  metoprolol succinate (TOPROL-XL) 50 MG 24 hr tablet Take 1 tablet (50 mg total) by mouth daily. Take with or immediately following a meal. 06/17/15   [name], MD   potassium chloride SA (K-DUR,KLOR-CON) 20 MEQ tablet Take 1 tablet (20 mEq total) by mouth 2 (two) times daily. Patient not taking: Reported on 07/18/2015 07/04/15   [name], MD  spironolactone (ALDACTONE) 25 MG tablet Take 1 tablet (25 mg total) by mouth daily. Patient not taking: Reported on 07/18/2015 06/17/15   [name], MD  torsemide (DEMADEX) 20 MG tablet Take 1 tablet (20 mg total) by mouth 2 (two) times daily. 07/08/15   [name], MD   Triage Vitals: BP 151/114 mmHg  Pulse 107  Temp(Src) 98 F (36.7 C) (Oral)  Resp 24  Ht 6' (1.829 m)  Wt 330 lb (149.687 kg)  BMI 44.75 kg/m2  SpO2 100% Physical Exam  Constitutional: He is oriented to person, place, and time. He appears well-developed and well-nourished.  HENT:  Head: Normocephalic and atraumatic.  Eyes: EOM are normal.  Neck: Normal range of motion.  Cardiovascular: Normal heart sounds and intact distal pulses.  An irregularly irregular rhythm present. Tachycardia present.   Pulmonary/Chest: Effort normal and breath sounds normal. No respiratory distress.  Abdominal: Soft. He exhibits no distension. There is no tenderness.  Musculoskeletal: Normal range of motion. He exhibits edema (severe lower extremity edema).  Neurological: He is alert and oriented to person, place, and time.  Skin: Skin is warm and dry.  Psychiatric: He has a normal mood and affect. Judgment normal.  Nursing note and vitals reviewed.   ED Course  Procedures (including critical care time) DIAGNOSTIC STUDIES: Oxygen Saturation is 100% on ra, nl by my interpretation.    COORDINATION OF CARE: 12:34 PM: Discussed treatment plan which includes labs, imaging, EKG with pt at bedside; patient verbalizes understanding and agrees with treatment plan.  Labs Review Labs Reviewed  BASIC METABOLIC PANEL - Abnormal; Notable for the following:    Potassium 3.1 (*)    BUN 23 (*)    Creatinine, Ser 1.28 (*)    Calcium 8.6 (*)    All other  components within normal limits  CBC - Abnormal; Notable for the following:    Hemoglobin 12.3 (*)    HCT 37.5 (*)    All other components within normal limits  BRAIN NATRIURETIC PEPTIDE - Abnormal; Notable for the following:    B Natriuretic Peptide 756.0 (*)    All other components within normal limits  TROPONIN I - Abnormal; Notable for the following:    Troponin I 0.04 (*)    All other components within normal limits  PROTIME-INR - Abnormal; Notable for the following:    Prothrombin Time 16.8 (*)    All other components within normal limits  TROPONIN I    Imaging Review Dg Chest 2 View  08/02/2015  CLINICAL  DATA:  Three-day history of cough and shortness of breath EXAM: CHEST  2 VIEW COMPARISON:  July 12, 2015 FINDINGS: There is patchy infiltrate in the right lower lobe. Lungs elsewhere clear. There is stable cardiomegaly with pulmonary vascular within normal limits. No adenopathy. No bone lesions. IMPRESSION: Right lower lobe infiltrate. Lungs elsewhere clear. Stable cardiac enlargement. Followup PA and lateral chest radiographs recommended in 3-4 weeks following trial of antibiotic therapy to ensure resolution and exclude underlying malignancy. Electronically Signed   By: Bretta Bang III M.D.   On: 08/02/2015 12:56   I have personally reviewed and evaluated these images and lab results as part of my medical decision-making.   EKG Interpretation   Date/Time:  Friday August 02 2015 11:56:53 EST Ventricular Rate:  104 PR Interval:    QRS Duration: 96 QT Interval:  368 QTC Calculation: 483 R Axis:   47 Text Interpretation:  Atrial fibrillation with rapid ventricular response  Nonspecific T wave abnormality Abnormal ECG Confirmed by Aiyonna Lucado  MD,  Clark Cuff ([id number]) on 08/02/2015 4:18:21 PM      MDM   Final diagnoses:  Chronic heart failure, unspecified heart failure type (HCC)  HCAP (healthcare-associated pneumonia)   49yM with dyspnea. CXR with R lower lobe  infiltrate and will treat for possible pneumonia, but suspect symptoms are more simply from volume overload. Afebrile. No leukocytosis. Symptoms of exertional dyspnea, orthopnea, etc are more consistent with heart failure than infectious process. Weight is up 30 lbs from last recorded of 299 lbs on 07/18/15.  He has had good response to a dose of IV lasix and has urinated over a liter at this point. Home meds ordered as well. He initially reported taking his medication appropriately but, after subsequently reviewing some of his recent notes and speaking with him again, he admits to sporadic compliance with some medications and not taking others at all. Stressed the importance of taking all his medications regularly which cardiology and others have done as well. He reports feeling better. o2 saturations remain good on room air. He appears comfortable. His HR is occasionally going into 110s but mostly in 70-90 range.   At this point I do not feel he requires admission. His troponin is minimally elevated, he denies CP. Repeat is not significantly higher. EKG similar to recent ones. Minimally elevated on previous ED visit as well. Had catheterization 6 weeks ago with normal coronaries.   Will discharge with levaquin for possible HCAP since he was admitted less than 2 months ago. Received a dose of zosyn/vanc in ED. Reiterated the importance of taking his medications. Return precautions were discussed. FU otherwise with PCP with regards to possible pneumonia and with cardiology for other issues.   07/18/15 299 lb (135.626 kg)  07/08/15 312 lb (141.522 kg)  07/04/15 333 lb (151.048 kg)          I personally preformed the services scribed in my presence. The recorded information has been reviewed is accurate. Samuel Razor, MD.     Samuel Razor, MD 08/02/15 [phone]

## 2015-08-02 NOTE — ED Notes (Signed)
Patient complaining of shortness of breath and states "I'm having an irregular heart beat, it's been out of rhythm."

## 2015-08-05 ENCOUNTER — Encounter: Payer: Self-pay | Admitting: Adult Health

## 2015-08-05 ENCOUNTER — Encounter: Payer: Medicare Other | Admitting: Adult Health

## 2015-08-05 NOTE — Progress Notes (Signed)
Cardiology Office Note   Date:  08/05/2015   ID:  [name], DOB 1966/04/17, MRN [id number]  ERROR NO SHOW

## 2015-08-25 ENCOUNTER — Other Ambulatory Visit: Payer: Self-pay | Admitting: Cardiovascular Disease

## 2015-09-03 ENCOUNTER — Encounter (HOSPITAL_COMMUNITY): Payer: Self-pay | Admitting: *Deleted

## 2015-09-03 ENCOUNTER — Emergency Department (HOSPITAL_COMMUNITY)
Admission: EM | Admit: 2015-09-03 | Discharge: 2015-09-04 | Disposition: A | Discharge status: Home / Self Care | Payer: Medicare Other | Attending: Emergency Medicine | Admitting: Emergency Medicine

## 2015-09-03 ENCOUNTER — Emergency Department (HOSPITAL_COMMUNITY)
Admission: EM | Admit: 2015-09-03 | Discharge: 2015-09-03 | Disposition: A | Discharge status: Home / Self Care | Payer: Medicare Other | Source: Home / Self Care | Attending: Emergency Medicine | Admitting: Emergency Medicine

## 2015-09-03 ENCOUNTER — Encounter (HOSPITAL_COMMUNITY): Payer: Self-pay

## 2015-09-03 ENCOUNTER — Emergency Department (HOSPITAL_COMMUNITY): Payer: Medicare Other

## 2015-09-03 DIAGNOSIS — I5023 Acute on chronic systolic (congestive) heart failure: Secondary | ICD-10-CM | POA: Insufficient documentation

## 2015-09-03 DIAGNOSIS — M1990 Unspecified osteoarthritis, unspecified site: Secondary | ICD-10-CM | POA: Insufficient documentation

## 2015-09-03 DIAGNOSIS — E876 Hypokalemia: Secondary | ICD-10-CM

## 2015-09-03 DIAGNOSIS — Z7982 Long term (current) use of aspirin: Secondary | ICD-10-CM

## 2015-09-03 DIAGNOSIS — Z79899 Other long term (current) drug therapy: Secondary | ICD-10-CM | POA: Insufficient documentation

## 2015-09-03 DIAGNOSIS — R0602 Shortness of breath: Secondary | ICD-10-CM | POA: Diagnosis present

## 2015-09-03 DIAGNOSIS — I110 Hypertensive heart disease with heart failure: Secondary | ICD-10-CM

## 2015-09-03 DIAGNOSIS — I509 Heart failure, unspecified: Secondary | ICD-10-CM

## 2015-09-03 DIAGNOSIS — Z91199 Patient's noncompliance with other medical treatment and regimen due to unspecified reason: Secondary | ICD-10-CM

## 2015-09-03 DIAGNOSIS — J45909 Unspecified asthma, uncomplicated: Secondary | ICD-10-CM | POA: Insufficient documentation

## 2015-09-03 DIAGNOSIS — E785 Hyperlipidemia, unspecified: Secondary | ICD-10-CM | POA: Insufficient documentation

## 2015-09-03 DIAGNOSIS — I10 Essential (primary) hypertension: Secondary | ICD-10-CM

## 2015-09-03 DIAGNOSIS — Z9119 Patient's noncompliance with other medical treatment and regimen: Secondary | ICD-10-CM

## 2015-09-03 DIAGNOSIS — Z87891 Personal history of nicotine dependence: Secondary | ICD-10-CM | POA: Insufficient documentation

## 2015-09-03 DIAGNOSIS — Z91030 Bee allergy status: Secondary | ICD-10-CM | POA: Insufficient documentation

## 2015-09-03 DIAGNOSIS — I4891 Unspecified atrial fibrillation: Secondary | ICD-10-CM | POA: Insufficient documentation

## 2015-09-03 DIAGNOSIS — Z9111 Patient's noncompliance with dietary regimen: Secondary | ICD-10-CM | POA: Diagnosis not present

## 2015-09-03 DIAGNOSIS — R531 Weakness: Secondary | ICD-10-CM

## 2015-09-03 DIAGNOSIS — Z91119 Patient's noncompliance with dietary regimen due to unspecified reason: Secondary | ICD-10-CM

## 2015-09-03 LAB — BASIC METABOLIC PANEL
ANION GAP: 9 (ref 5–15)
BUN: 20 mg/dL (ref 6–20)
CHLORIDE: 107 mmol/L (ref 101–111)
CO2: 27 mmol/L (ref 22–32)
Calcium: 8.7 mg/dL — ABNORMAL LOW (ref 8.9–10.3)
Creatinine, Ser: 1.18 mg/dL (ref 0.61–1.24)
GFR calc Af Amer: >60 mL/min (ref >60)
GFR calc non Af Amer: >60 mL/min (ref >60)
GLUCOSE: 96 mg/dL (ref 65–99)
POTASSIUM: 3 mmol/L — AB (ref 3.5–5.1)
Sodium: 143 mmol/L (ref 135–145)

## 2015-09-03 LAB — CBC WITH DIFFERENTIAL/PLATELET
BASOS ABS: 0 10*3/uL (ref 0.0–0.1)
Basophils Relative: 0 %
Eosinophils Absolute: 0.1 10*3/uL (ref 0.0–0.7)
Eosinophils Relative: 2 %
HEMATOCRIT: 37.9 % — AB (ref 39.0–52.0)
HEMOGLOBIN: 12.4 g/dL — AB (ref 13.0–17.0)
LYMPHS ABS: 1.1 10*3/uL (ref 0.7–4.0)
LYMPHS PCT: 25 %
MCH: 28.6 pg (ref 26.0–34.0)
MCHC: 32.7 g/dL (ref 30.0–36.0)
MCV: 87.5 fL (ref 78.0–100.0)
Monocytes Absolute: 0.3 10*3/uL (ref 0.1–1.0)
Monocytes Relative: 6 %
NEUTROS ABS: 3.1 10*3/uL (ref 1.7–7.7)
NEUTROS PCT: 67 %
Platelets: 210 10*3/uL (ref 150–400)
RBC: 4.33 MIL/uL (ref 4.22–5.81)
RDW: 14.7 % (ref 11.5–15.5)
WBC: 4.6 10*3/uL (ref 4.0–10.5)

## 2015-09-03 LAB — URINALYSIS, ROUTINE W REFLEX MICROSCOPIC
BILIRUBIN URINE: NEGATIVE
Glucose, UA: NEGATIVE mg/dL
HGB URINE DIPSTICK: NEGATIVE
Ketones, ur: NEGATIVE mg/dL
Leukocytes, UA: NEGATIVE
NITRITE: NEGATIVE
PROTEIN: NEGATIVE mg/dL
SPECIFIC GRAVITY, URINE: 1.01 (ref 1.005–1.030)
pH: 7.5 (ref 5.0–8.0)

## 2015-09-03 LAB — TROPONIN I
TROPONIN I: 0.04 ng/mL — AB (ref <0.031)
Troponin I: 0.04 ng/mL — ABNORMAL HIGH (ref <0.031)

## 2015-09-03 LAB — BRAIN NATRIURETIC PEPTIDE: B Natriuretic Peptide: 647 pg/mL — ABNORMAL HIGH (ref 0.0–100.0)

## 2015-09-03 MED ORDER — DILTIAZEM HCL ER COATED BEADS 180 MG PO CP24
ORAL_CAPSULE | ORAL | Status: AC
  Filled 2015-09-03: qty 2

## 2015-09-03 MED ORDER — CLONIDINE HCL 0.1 MG PO TABS
0.1000 mg | ORAL_TABLET | Freq: Once | ORAL | Status: DC
  Filled 2015-09-03: qty 1

## 2015-09-03 MED ORDER — LOSARTAN POTASSIUM 50 MG PO TABS
50.0000 mg | ORAL_TABLET | Freq: Once | ORAL | Status: AC
  Administered 2015-09-03: 50 mg via ORAL
  Filled 2015-09-03: qty 1

## 2015-09-03 MED ORDER — APIXABAN 5 MG PO TABS
5.0000 mg | ORAL_TABLET | Freq: Once | ORAL | Status: AC
  Administered 2015-09-03: 5 mg via ORAL
  Filled 2015-09-03: qty 1

## 2015-09-03 MED ORDER — METOPROLOL SUCCINATE ER 50 MG PO TB24
50.0000 mg | ORAL_TABLET | Freq: Once | ORAL | Status: AC
  Administered 2015-09-03: 50 mg via ORAL
  Filled 2015-09-03: qty 1

## 2015-09-03 MED ORDER — POTASSIUM CHLORIDE ER 20 MEQ PO TBCR: 20.0000 meq | EXTENDED_RELEASE_TABLET | Freq: Two times a day (BID) | ORAL | Status: DC

## 2015-09-03 MED ORDER — DILTIAZEM HCL 25 MG/5ML IV SOLN
10.0000 mg | Freq: Once | INTRAVENOUS | Status: AC
  Administered 2015-09-03: 10 mg via INTRAVENOUS
  Filled 2015-09-03: qty 5

## 2015-09-03 MED ORDER — FUROSEMIDE 10 MG/ML IJ SOLN
40.0000 mg | Freq: Once | INTRAMUSCULAR | Status: AC
  Administered 2015-09-03: 40 mg via INTRAVENOUS
  Filled 2015-09-03: qty 4

## 2015-09-03 MED ORDER — POTASSIUM CHLORIDE CRYS ER 20 MEQ PO TBCR
40.0000 meq | EXTENDED_RELEASE_TABLET | Freq: Once | ORAL | Status: AC
  Administered 2015-09-03: 40 meq via ORAL
  Filled 2015-09-03: qty 2

## 2015-09-03 MED ORDER — DILTIAZEM HCL ER COATED BEADS 180 MG PO CP24
360.0000 mg | ORAL_CAPSULE | Freq: Every day | ORAL | Status: DC
  Administered 2015-09-03: 360 mg via ORAL
  Filled 2015-09-03 (×2): qty 1

## 2015-09-03 MED ORDER — SPIRONOLACTONE 25 MG PO TABS
25.0000 mg | ORAL_TABLET | Freq: Once | ORAL | Status: AC
  Administered 2015-09-03: 25 mg via ORAL
  Filled 2015-09-03: qty 1

## 2015-09-03 MED ORDER — CLONIDINE HCL 0.1 MG PO TABS: 0.1000 mg | ORAL_TABLET | Freq: Once | ORAL | Status: DC

## 2015-09-03 NOTE — ED Notes (Signed)
Pt took 5 steps and stated he could not go any further. He states he is too weak and tired. O2 sats dropped to 95%.

## 2015-09-03 NOTE — Discharge Instructions (Signed)
Please take your medications as prescribed and follow up with your cardiologist.  You should take 20 meq of potassium twice a day for the next 3 days as your potassium was slightly low at 3.0 today.  You should be taking Eliquis 5 mg twice a day. This is a medication that prevents you from having a stroke. You should be taking torsemide 20 mg twice a day. This is a fluid pill and will keep fluid off of your lungs and legs. You should be on clonidine 0.1 mg twice a day which will help with your blood pressure. You should be on diltiazem CD 360 mg once a day which will help keep your heart rate and blood pressure under control. You should be on metoprolol XL 50 mg once a day which will help with your heart rate and blood pressure. You should be on losartan 50 mg once a day which will help with your blood pressure. You should be on spironolactone 25 mg once a day which will help with your lower extremity swelling, blood pressure and low potassium. You should be on Lipitor 80 mg at bedtime which will help with your cholesterol.    Shortness of Breath Shortness of breath means you have trouble breathing. It could also mean that you have a medical problem. You should get immediate medical care for shortness of breath. CAUSES   Not enough oxygen in the air such as with high altitudes or a smoke-filled room.  Certain lung diseases, infections, or problems.  Heart disease or conditions, such as angina or heart failure.  Low red blood cells (anemia).  Poor physical fitness, which can cause shortness of breath when you exercise.  Chest or back injuries or stiffness.  Being overweight.  Smoking.  Anxiety, which can make you feel like you are not getting enough air. DIAGNOSIS  Serious medical problems can often be found during your physical exam. Tests may also be done to determine why you are having shortness of breath. Tests may include:  Chest X-rays.  Lung function tests.  Blood  tests.  An electrocardiogram (ECG).  An ambulatory electrocardiogram. An ambulatory ECG records your heartbeat patterns over a 24-hour period.  Exercise testing.  A transthoracic echocardiogram (TTE). During echocardiography, sound waves are used to evaluate how blood flows through your heart.  A transesophageal echocardiogram (TEE).  Imaging scans. Your health care provider may not be able to find a cause for your shortness of breath after your exam. In this case, it is important to have a follow-up exam with your health care provider as directed.  TREATMENT  Treatment for shortness of breath depends on the cause of your symptoms and can vary greatly. HOME CARE INSTRUCTIONS   Do not smoke. Smoking is a common cause of shortness of breath. If you smoke, ask for help to quit.  Avoid being around chemicals or things that may bother your breathing, such as paint fumes and dust.  Rest as needed. Slowly resume your usual activities.  If medicines were prescribed, take them as directed for the full length of time directed. This includes oxygen and any inhaled medicines.  Keep all follow-up appointments as directed by your health care provider. SEEK MEDICAL CARE IF:   Your condition does not improve in the time expected.  You have a hard time doing your normal activities even with rest.  You have any new symptoms. SEEK IMMEDIATE MEDICAL CARE IF:   Your shortness of breath gets worse.  You feel light-headed,  faint, or develop a cough not controlled with medicines.  You start coughing up blood.  You have pain with breathing.  You have chest pain or pain in your arms, shoulders, or abdomen.  You have a fever.  You are unable to walk up stairs or exercise the way you normally do. MAKE SURE YOU:  Understand these instructions.  Will watch your condition.  Will get help right away if you are not doing well or get worse.   This information is not intended to replace advice  given to you by your health care provider. Make sure you discuss any questions you have with your health care provider.   Document Released: 03/03/2001 Document Revised: 06/13/2013 Document Reviewed: 08/24/2011 Elsevier Interactive Patient Education 2016 [employer]   Heart Failure Heart failure is a condition in which the heart has trouble pumping blood. This means your heart does not pump blood efficiently for your body to work well. In some cases of heart failure, fluid may back up into your lungs or you may have swelling (edema) in your lower legs. Heart failure is usually a long-term (chronic) condition. It is important for you to take good care of yourself and follow your health care provider's treatment plan. CAUSES  Some health conditions can cause heart failure. Those health conditions include:  High blood pressure (hypertension). Hypertension causes the heart muscle to work harder than normal. When pressure in the blood vessels is high, the heart needs to pump (contract) with more force in order to circulate blood throughout the body. High blood pressure eventually causes the heart to become stiff and weak.  Coronary artery disease (CAD). CAD is the buildup of cholesterol and fat (plaque) in the arteries of the heart. The blockage in the arteries deprives the heart muscle of oxygen and blood. This can cause chest pain and may lead to a heart attack. High blood pressure can also contribute to CAD.  Heart attack (myocardial infarction). A heart attack occurs when one or more arteries in the heart become blocked. The loss of oxygen damages the muscle tissue of the heart. When this happens, part of the heart muscle dies. The injured tissue does not contract as well and weakens the heart's ability to pump blood.  Abnormal heart valves. When the heart valves do not open and close properly, it can cause heart failure. This makes the heart muscle pump harder to keep the blood flowing.  Heart  muscle disease (cardiomyopathy or myocarditis). Heart muscle disease is damage to the heart muscle from a variety of causes. These can include drug or alcohol abuse, infections, or unknown reasons. These can increase the risk of heart failure.  Lung disease. Lung disease makes the heart work harder because the lungs do not work properly. This can cause a strain on the heart, leading it to fail.  Diabetes. Diabetes increases the risk of heart failure. High blood sugar contributes to high fat (lipid) levels in the blood. Diabetes can also cause slow damage to tiny blood vessels that carry important nutrients to the heart muscle. When the heart does not get enough oxygen and food, it can cause the heart to become weak and stiff. This leads to a heart that does not contract efficiently.  Other conditions can contribute to heart failure. These include abnormal heart rhythms, thyroid problems, and low blood counts (anemia). Certain unhealthy behaviors can increase the risk of heart failure, including:  Being overweight.  Smoking or chewing tobacco.  Eating foods high in  fat and cholesterol.  Abusing illicit drugs or alcohol.  Lacking physical activity. SYMPTOMS  Heart failure symptoms may vary and can be hard to detect. Symptoms may include:  Shortness of breath with activity, such as climbing stairs.  Persistent cough.  Swelling of the feet, ankles, legs, or abdomen.  Unexplained weight gain.  Difficulty breathing when lying flat (orthopnea).  Waking from sleep because of the need to sit up and get more air.  Rapid heartbeat.  Fatigue and loss of energy.  Feeling light-headed, dizzy, or close to fainting.  Loss of appetite.  Nausea.  Increased urination during the night (nocturia). DIAGNOSIS  A diagnosis of heart failure is based on your history, symptoms, physical examination, and diagnostic tests. Diagnostic tests for heart failure may  include:  Echocardiography.  Electrocardiography.  Chest X-ray.  Blood tests.  Exercise stress test.  Cardiac angiography.  Radionuclide scans. TREATMENT  Treatment is aimed at managing the symptoms of heart failure. Medicines, behavioral changes, or surgical intervention may be necessary to treat heart failure.  Medicines to help treat heart failure may include:  Angiotensin-converting enzyme (ACE) inhibitors. This type of medicine blocks the effects of a blood protein called angiotensin-converting enzyme. ACE inhibitors relax (dilate) the blood vessels and help lower blood pressure.  Angiotensin receptor blockers (ARBs). This type of medicine blocks the actions of a blood protein called angiotensin. Angiotensin receptor blockers dilate the blood vessels and help lower blood pressure.  Water pills (diuretics). Diuretics cause the kidneys to remove salt and water from the blood. The extra fluid is removed through urination. This loss of extra fluid lowers the volume of blood the heart pumps.  Beta blockers. These prevent the heart from beating too fast and improve heart muscle strength.  Digitalis. This increases the force of the heartbeat.  Healthy behavior changes include:  Obtaining and maintaining a healthy weight.  Stopping smoking or chewing tobacco.  Eating heart-healthy foods.  Limiting or avoiding alcohol.  Stopping illicit drug use.  Physical activity as directed by your health care provider.  Surgical treatment for heart failure may include:  A procedure to open blocked arteries, repair damaged heart valves, or remove damaged heart muscle tissue.  A pacemaker to improve heart muscle function and control certain abnormal heart rhythms.  An internal cardioverter defibrillator to treat certain serious abnormal heart rhythms.  A left ventricular assist device (LVAD) to assist the pumping ability of the heart. HOME CARE INSTRUCTIONS   Take medicines only  as directed by your health care provider. Medicines are important in reducing the workload of your heart, slowing the progression of heart failure, and improving your symptoms.  Do not stop taking your medicine unless directed by your health care provider.  Do not skip any dose of medicine.  Refill your prescriptions before you run out of medicine. Your medicines are needed every day.  Engage in moderate physical activity if directed by your health care provider. Moderate physical activity can benefit some people. The elderly and people with severe heart failure should consult with a health care provider for physical activity recommendations.  Eat heart-healthy foods. Food choices should be free of trans fat and low in saturated fat, cholesterol, and salt (sodium). Healthy choices include fresh or frozen fruits and vegetables, fish, lean meats, legumes, fat-free or low-fat dairy products, and whole grain or high fiber foods. Talk to a dietitian to learn more about heart-healthy foods.  Limit sodium if directed by your health care provider. Sodium restriction may reduce  symptoms of heart failure in some people. Talk to a dietitian to learn more about heart-healthy seasonings.  Use healthy cooking methods. Healthy cooking methods include roasting, grilling, broiling, baking, poaching, steaming, or stir-frying. Talk to a dietitian to learn more about healthy cooking methods.  Limit fluids if directed by your health care provider. Fluid restriction may reduce symptoms of heart failure in some people.  Weigh yourself every day. Daily weights are important in the early recognition of excess fluid. You should weigh yourself every morning after you urinate and before you eat breakfast. Wear the same amount of clothing each time you weigh yourself. Record your daily weight. Provide your health care provider with your weight record.  Monitor and record your blood pressure if directed by your health care  provider.  Check your pulse if directed by your health care provider.  Lose weight if directed by your health care provider. Weight loss may reduce symptoms of heart failure in some people.  Stop smoking or chewing tobacco. Nicotine makes your heart work harder by causing your blood vessels to constrict. Do not use nicotine gum or patches before talking to your health care provider.  Keep all follow-up visits as directed by your health care provider. This is important.  Limit alcohol intake to no more than 1 drink per day for nonpregnant women and 2 drinks per day for men. One drink equals 12 ounces of beer, 5 ounces of wine, or 1 ounces of hard liquor. Drinking more than that is harmful to your heart. Tell your health care provider if you drink alcohol several times a week. Talk with your health care provider about whether alcohol is safe for you. If your heart has already been damaged by alcohol or you have severe heart failure, drinking alcohol should be stopped completely.  Stop illicit drug use.  Stay up-to-date with immunizations. It is especially important to prevent respiratory infections through current pneumococcal and influenza immunizations.  Manage other health conditions such as hypertension, diabetes, thyroid disease, or abnormal heart rhythms as directed by your health care provider.  Learn to manage stress.  Plan rest periods when fatigued.  Learn strategies to manage high temperatures. If the weather is extremely hot:  Avoid vigorous physical activity.  Use air conditioning or fans or seek a cooler location.  Avoid caffeine and alcohol.  Wear loose-fitting, lightweight, and light-colored clothing.  Learn strategies to manage cold temperatures. If the weather is extremely cold:  Avoid vigorous physical activity.  Layer clothes.  Wear mittens or gloves, a hat, and a scarf when going outside.  Avoid alcohol.  Obtain ongoing education and support as  needed.  Participate in or seek rehabilitation as needed to maintain or improve independence and quality of life. SEEK MEDICAL CARE IF:   You have a rapid weight gain.  You have increasing shortness of breath that is unusual for you.  You are unable to participate in your usual physical activities.  You tire easily.  You cough more than normal, especially with physical activity.  You have any or more swelling in areas such as your hands, feet, ankles, or abdomen.  You are unable to sleep because it is hard to breathe.  You feel like your heart is beating fast (palpitations).  You become dizzy or light-headed upon standing up. SEEK IMMEDIATE MEDICAL CARE IF:   You have difficulty breathing.  There is a change in mental status such as decreased alertness or difficulty with concentration.  You have a pain  or discomfort in your chest.  You have an episode of fainting (syncope). MAKE SURE YOU:   Understand these instructions.  Will watch your condition.  Will get help right away if you are not doing well or get worse.   This information is not intended to replace advice given to you by your health care provider. Make sure you discuss any questions you have with your health care provider.   Document Released: 06/08/2005 Document Revised: 10/23/2014 Document Reviewed: 07/08/2012 Else[employer] Interactive Patient Education [employer]

## 2015-09-03 NOTE — ED Notes (Signed)
Pt instructed that he needs to call his doctor today for a follow up appt. Pt verbalized understanding.

## 2015-09-03 NOTE — ED Notes (Signed)
Patient states he was seen here earlier today and discharged. Patient states he is retaining fluid, and having shortness of breath with lower extremity edema.

## 2015-09-03 NOTE — ED Provider Notes (Addendum)
TIME SEEN: 5:50 AM  CHIEF COMPLAINT: Shortness of breath  HPI: Patient is a 50 year old male with history of hypertension, hyperlipidemia, obesity, dCHF, atrial fibrillation who is on Eliquis who presents to the emergency department shortness of breath for the past week. Patient is a very poor historian. He denies a chest pain or chest discomfort. No fevers or cough. States he thinks that his lower extremity swelling may be worse than normal. States he believes he is taking all of his medications as prescribed.  Patient had a cardiac catheterization in December 2016 which showed normal coronary arteries but elevated right sided heart pressures consistent with volume overload, pulmonary hypertension. At that time he had elevated troponin which was thought secondary to pulmonary hypertension. States he is not sure if he has had any weight gain. Denies orthopnea.  PCP - Hyman Bower Cardiologist - Purvis Sheffield   Echo December 2016 -   Study Conclusions  - Left ventricle: The cavity size was normal. There was severe  concentric hypertrophy. Systolic function was normal. The  estimated ejection fraction was approximately 55%. Wall motion  was normal; there were no regional wall motion abnormalities. The  study was not technically sufficient to allow evaluation of LV  diastolic dysfunction due to atrial fibrillation. - Aortic valve: Mildly calcified annulus. Trileaflet. - Mitral valve: Mild to moderately thickened leaflets . There was  mild regurgitation. - Left atrium: The atrium was severely dilated. - Right ventricle: The cavity size was mildly dilated. Systolic  function was mildly reduced. - Right atrium: The atrium was moderately dilated. - Tricuspid valve: There was mild regurgitation. - Pulmonary arteries: Systolic pressure was moderately to severely  increased. PA peak pressure: 59 mm Hg (S). - Inferior vena cava: The vessel was dilated. The respirophasic  diameter changes  were in the normal range (>= 50%). Estimated CVP  8 mmHg.  ROS: See HPI Constitutional: no fever  Eyes: no drainage  ENT: no runny nose   Cardiovascular:  no chest pain  Resp:  SOB  GI: no vomiting GU: no dysuria Integumentary: no rash  Allergy: no hives  Musculoskeletal: Acute on chronic bilateral leg swelling  Neurological: no slurred speech ROS otherwise negative  PAST MEDICAL HISTORY/PAST SURGICAL HISTORY:  Past Medical History  Diagnosis Date  . Hypertensive heart disease     Hypertensive heart disease with prior episodes of CHF  . Hyperlipidemia   . Hypertension   . Gastroesophageal reflux disease   . Osteoarthritis   . Peptic ulcer disease   . Morbid obesity (HCC)     /sleep apnea  . Allergic rhinitis     plus h/o asthma  . Tobacco abuse, in remission     Cigarettes discontinued in 2010  . CHF (congestive heart failure) (HCC)   . A-fib (HCC)     2015  . Asthma     MEDICATIONS:  Prior to Admission medications   Medication Sig Start Date End Date Taking? Authorizing Provider  apixaban (ELIQUIS) 5 MG TABS tablet Take 1 tablet (5 mg total) by mouth 2 (two) times daily. Patient not taking: Reported on 07/08/2015 06/17/15   [name], MD  aspirin EC 81 MG EC tablet Take 1 tablet (81 mg total) by mouth daily. Patient taking differently: Take 81 mg by mouth daily as needed for pain.  06/17/15   [name], MD  atorvastatin (LIPITOR) 80 MG tablet Take 1 tablet (80 mg total) by mouth daily at 6 PM. Patient not taking: Reported on 07/08/2015  06/17/15   [name], MD  beclomethasone (QVAR) 80 MCG/ACT inhaler Inhale 2 puffs into the lungs daily.     Historical Provider, MD  Calcium Carbonate Antacid (ALKA-SELTZER ANTACID PO) Take 1 tablet by mouth as needed (upset stomach).    Historical Provider, MD  cloNIDine (CATAPRES) 0.1 MG tablet Take 0.1 mg by mouth daily.     Historical Provider, MD  diltiazem (CARDIZEM CD) 360 MG 24 hr capsule Take 1 capsule  (360 mg total) by mouth daily. Patient not taking: Reported on 08/02/2015 07/08/15   [name], MD  levofloxacin (LEVAQUIN) 500 MG tablet Take 1 tablet (500 mg total) by mouth daily. 08/02/15   [name], MD  losartan (COZAAR) 50 MG tablet Take 1 tablet (50 mg total) by mouth daily. Patient not taking: Reported on 07/08/2015 06/17/15   [name], MD  metoprolol succinate (TOPROL-XL) 50 MG 24 hr tablet Take 1 tablet (50 mg total) by mouth daily. Take with or immediately following a meal. Patient taking differently: Take 50 mg by mouth every other day. Take with or immediately following a meal. 06/17/15   [name], MD  potassium chloride SA (K-DUR,KLOR-CON) 20 MEQ tablet Take 1 tablet (20 mEq total) by mouth 2 (two) times daily. 07/04/15   [name], MD  spironolactone (ALDACTONE) 25 MG tablet Take 1 tablet (25 mg total) by mouth daily. Patient not taking: Reported on 07/18/2015 06/17/15   [name], MD  torsemide (DEMADEX) 20 MG tablet Take 1 tablet (20 mg total) by mouth 2 (two) times daily. 07/08/15   [name], MD    ALLERGIES:  Allergies  Allergen Reactions  . Bee Venom Shortness Of Breath and Swelling  . Aspirin Other (See Comments)    Chewable 81 mg tablets upsets patients stomach  . Banana Diarrhea  . Hydralazine Other (See Comments)    Patient states it causes Tachycardia    SOCIAL HISTORY:  Social History  Substance Use Topics  . Smoking status: Former Smoker -- 0.50 packs/day for 20 years    Types: Cigarettes    Start date: 04/26/1988    Quit date: 06/23/2007  . Smokeless tobacco: Never Used     Comment: 1 ppd former smoker  . Alcohol Use: No     Comment: no etoh since 2009    FAMILY HISTORY: Family History  Problem Relation Age of Onset  . Colon cancer Neg Hx   . Inflammatory bowel disease Neg Hx   . Liver disease Neg Hx     EXAM: BP 162/131 mmHg  Pulse 104  Temp(Src) 98.8 F (37.1 C) (Oral)  Resp 21  Ht 6'  (1.829 m)  Wt 330 lb (149.687 kg)  BMI 44.75 kg/m2  SpO2 97% CONSTITUTIONAL: Alert and oriented and responds appropriately to questions. Obese, chronically ill-appearing, in no significant distress, will not open eyes when you are in the room talking however examining him, speaks in a whisper HEAD: Normocephalic EYES: Conjunctivae clear, PERRL ENT: normal nose; no rhinorrhea; moist mucous membranes NECK: Supple, no meningismus, no LAD; no JVD appreciated but exam is limited given patient's obesity CARD: Irregularly irregular and tachycardic; S1 and S2 appreciated; no murmurs, no clicks, no rubs, no gallops RESP: Normal chest excursion without splinting or tachypnea; breath sounds clear and equal bilaterally; no wheezes, no rhonchi, no rales, no hypoxia or respiratory distress, speaking full sentences, oxygen saturation between 91 and 97% on room air at rest ABD/GI: Normal bowel sounds; non-distended; soft,  non-tender, no rebound, no guarding, no peritoneal signs BACK:  The back appears normal and is non-tender to palpation, there is no CVA tenderness EXT: Normal ROM in all joints; non-tender to palpation; bilateral nonpitting lower extremity edema to the level of the knee; normal capillary refill; no cyanosis, no calf tenderness or swelling    SKIN: Normal color for age and race; warm; no rash NEURO: Moves all extremities equally, sensation to light touch intact diffusely, cranial nerves II through XII intact PSYCH: The patient's mood and manner are appropriate. Grooming and personal hygiene are appropriate.  MEDICAL DECISION MAKING: Patient here with complaints of shortness of breath. States that "fluid is building up on me". He is hypertensive, tachycardic but not hypoxic or in any respiratory distress. It is difficult to tell if he is acutely volume overloaded given he is morbidly obese. He does have lower extremity edema that is nonpitting. Lungs sound clear to auscultation. Will obtain cardiac  labs, chest x-ray. Given he is mildly tachycardic and hypertensive we'll give him a dose of his oral diltiazem as well as an IV diltiazem bolus. We'll closely monitor to see if he will need a diltiazem infusion. His heart rate does jump into the 140s to 150s intermittently. Will also give a dose of IV Lasix in the emergency department.  Patient reports he is allergic to aspirin.  ED PROGRESS: 7:10 AM  Pt reports feeling better. His heart rate and blood pressure have improved. Chest x-ray shows cardiomegaly and pulmonary vascular congestion is similar to a chest x-ray in February 2017. He does have a mild troponin leak which appears chronic (present since January 2016).  BNP 647.  We will ambulate patient with pulse oximetry.   7:30 AM  Pt has been able to ambulate several feet and his sats do not drop. States he feels very tired and weak all over. No focal neurologic deficit. No fever. States he feels a shortness breath is better and he is not having chest pain.  His BNP appears near baseline. His chest x-ray does not show any new volume overload compared to prior. He is not hypoxic today. He does not appear significantly volume overloaded. He is diuresing well after IV Lasix in the emergency department. Blood pressure and heart rate have also improved significantly after IV diltiazem he did receive his dose of oral diltiazem this morning. It seems like patient is likely not taking his medications as prescribed as he states that he thinks that they are causing him to have increased swelling in his legs, shortness of breath making him feel tired. At this time I do not feel he needs admission for IV diuresis. I feel he can receive another bolus of IV diltiazem and a second troponin. If there is no significant change in his troponin, his blood pressures and heart rate continued to remain stable and he is not hypoxic that he can be discharged home with outpatient cardiology follow-up. Discussed this plan with  patient and he verbalizes understanding is comfortable with this plan.  7:50 AM  Pt's blood pressure continues to rise. When I press him further he states that he is not taking any of his medications except for his torsemide every now and then. Discussed with him at length that this is why he feels poorly and that he needs to take his medications as prescribed. He should be on diltiazem, losartan, spironolactone, torsemide, clonidine and metoprolol. He is also supposed to be taking Eliquis but is not taking this either.  On review of his recent cardiology note in January 2017 they documented that he has a strong history of medical noncompliance. I have discussed with the patient at length that this can make him sick and lead to death.  I will give him his home doses of medications and reassess.  He has multiple bottles of all of his medications at bedside.  He does not need refills.  8:00 AM  Signed out to Dr. Estell Harpin to follow-up on patient's second troponin and to recheck patient's blood pressure.  Pt's BP is chronically elevated back to 2015. His last cardiology visit in January 2017 he had a blood pressure of 160/100 and a heart rate of 110. He denies headache, vision changes, focal neurologic deficits, chest pain. His blood pressure did improve the emergency department but once she began talking to him about repercussions of his medical noncompliance he becomes anxious and his blood pressure becomes more elevated. We have reordered his home medications to be restarted this morning. At this time, I feel that his blood pressure does improve somewhat his second troponin is not significantly more elevated that he can be discharged home as I do not feel there is anything that we will do inpatient that he cannot do at home.  8:25 AM  Pt's blood pressure has improved to 150/100. Heart rate is in the 90s. This is even before any of his home medications have been given other than diltiazem.   EKG  Interpretation  Date/Time:  Tuesday September 03 2015 05:49:51 EDT Ventricular Rate:  116 PR Interval:    QRS Duration: 94 QT Interval:  360 QTC Calculation: 500 R Axis:   63 Text Interpretation:  Atrial fibrillation Ventricular premature complex Nonspecific repol abnormality, lateral leads Prolonged QT interval No significant change since last tracing Confirmed by WARD,  DO, KRISTEN [phone]) on 09/03/2015 5:59:16 AM          Layla Maw Ward, DO 09/03/15 0825

## 2015-09-03 NOTE — ED Notes (Signed)
Pt brought in by [facility] for c/o sob; pt states he has been sob x 1 week; pt denies any fever, n/v/d

## 2015-09-04 ENCOUNTER — Emergency Department (HOSPITAL_COMMUNITY): Payer: Medicare Other

## 2015-09-04 ENCOUNTER — Ambulatory Visit (INDEPENDENT_AMBULATORY_CARE_PROVIDER_SITE_OTHER): Payer: Medicare Other | Admitting: Cardiology

## 2015-09-04 ENCOUNTER — Encounter: Payer: Self-pay | Admitting: Cardiology

## 2015-09-04 VITALS — BP 144/90 | HR 100 | Ht 72.0 in | Wt 313.2 lb

## 2015-09-04 DIAGNOSIS — I5033 Acute on chronic diastolic (congestive) heart failure: Secondary | ICD-10-CM

## 2015-09-04 LAB — BASIC METABOLIC PANEL
Anion gap: 8 (ref 5–15)
BUN: 26 mg/dL — AB (ref 6–20)
CO2: 27 mmol/L (ref 22–32)
CREATININE: 1.23 mg/dL (ref 0.61–1.24)
Calcium: 8.7 mg/dL — ABNORMAL LOW (ref 8.9–10.3)
Chloride: 105 mmol/L (ref 101–111)
GFR calc Af Amer: >60 mL/min (ref >60)
GLUCOSE: 91 mg/dL (ref 65–99)
Potassium: 3.4 mmol/L — ABNORMAL LOW (ref 3.5–5.1)
SODIUM: 140 mmol/L (ref 135–145)

## 2015-09-04 LAB — BRAIN NATRIURETIC PEPTIDE: B NATRIURETIC PEPTIDE 5: 313 pg/mL — AB (ref 0.0–100.0)

## 2015-09-04 MED ORDER — FUROSEMIDE 10 MG/ML IJ SOLN
80.0000 mg | Freq: Once | INTRAMUSCULAR | Status: AC
  Administered 2015-09-04: 80 mg via INTRAVENOUS
  Filled 2015-09-04: qty 8

## 2015-09-04 MED ORDER — FUROSEMIDE 40 MG PO TABS: 40.0000 mg | ORAL_TABLET | Freq: Two times a day (BID) | ORAL | Status: DC

## 2015-09-04 MED ORDER — POTASSIUM CHLORIDE CRYS ER 20 MEQ PO TBCR: 20.0000 meq | EXTENDED_RELEASE_TABLET | Freq: Two times a day (BID) | ORAL | Status: DC

## 2015-09-04 MED ORDER — POTASSIUM CHLORIDE CRYS ER 20 MEQ PO TBCR
40.0000 meq | EXTENDED_RELEASE_TABLET | Freq: Once | ORAL | Status: AC
  Administered 2015-09-04: 40 meq via ORAL
  Filled 2015-09-04: qty 2

## 2015-09-04 NOTE — Progress Notes (Signed)
Patient ID: [name], male   DOB: [birth year], 50 y.o.   MRN: [id number]     Clinical Summary [name] is a 50 y.o.male regular patient of Dr [name], he is added to my schedule today for post ER visit for recent CHF.  1. Acute on chronic diastolic heart failure - 05/2015 echo LVEF 55-60%, cannot eval diastolic function due to afib, severe LAE, mod RAE, mild RV dysfunction, PASP 59 - history of medication and dietary noncompliance - seen in ER 09/04/14 with volume overload. Given IV lasix and oral KCl and discharged.  - weight up 14 lbs since Jan 2017. Increased LE edema, increased SOB - compliant with meds. Lasix changed to torsemide in Jan 2017, he feels lasix worked better.  - not checking home weights.     Past Medical History  Diagnosis Date  . Hypertensive heart disease     Hypertensive heart disease with prior episodes of CHF  . Hyperlipidemia   . Hypertension   . Gastroesophageal reflux disease   . Osteoarthritis   . Peptic ulcer disease   . Morbid obesity (HCC)     /sleep apnea  . Allergic rhinitis     plus h/o asthma  . Tobacco abuse, in remission     Cigarettes discontinued in 2010  . CHF (congestive heart failure) (HCC)   . A-fib (HCC)     2015  . Asthma      Allergies  Allergen Reactions  . Bee Venom Shortness Of Breath and Swelling  . Aspirin Other (See Comments)    Chewable 81 mg tablets upsets patients stomach  . Banana Diarrhea  . Hydralazine Other (See Comments)    Patient states it causes Tachycardia     Current Outpatient Prescriptions  Medication Sig Dispense Refill  . apixaban (ELIQUIS) 5 MG TABS tablet Take 1 tablet (5 mg total) by mouth 2 (two) times daily. (Patient not taking: Reported on 07/08/2015) 60 tablet 0  . aspirin EC 81 MG EC tablet Take 1 tablet (81 mg total) by mouth daily. (Patient taking differently: Take 81 mg by mouth daily as needed for pain. )    . atorvastatin (LIPITOR) 80 MG tablet Take 1 tablet (80 mg total) by  mouth daily at 6 PM. (Patient not taking: Reported on 07/08/2015) 30 tablet 0  . beclomethasone (QVAR) 80 MCG/ACT inhaler Inhale 2 puffs into the lungs daily.     . Calcium Carbonate Antacid (ALKA-SELTZER ANTACID PO) Take 1 tablet by mouth as needed (upset stomach).    . cloNIDine (CATAPRES) 0.1 MG tablet Take 0.1 mg by mouth daily.     Marland Kitchen diltiazem (CARDIZEM CD) 360 MG 24 hr capsule Take 1 capsule (360 mg total) by mouth daily. (Patient not taking: Reported on 08/02/2015) 30 capsule 6  . levofloxacin (LEVAQUIN) 500 MG tablet Take 500 mg by mouth daily.    Marland Kitchen losartan (COZAAR) 50 MG tablet Take 1 tablet (50 mg total) by mouth daily. (Patient not taking: Reported on 07/08/2015) 30 tablet 0  . metoprolol succinate (TOPROL-XL) 50 MG 24 hr tablet Take 1 tablet (50 mg total) by mouth daily. Take with or immediately following a meal. (Patient taking differently: Take 50 mg by mouth every other day. Take with or immediately following a meal.) 30 tablet 0  . potassium chloride SA (K-DUR,KLOR-CON) 20 MEQ tablet Take 1 tablet (20 mEq total) by mouth 2 (two) times daily. 8 tablet 0  . spironolactone (ALDACTONE) 25 MG tablet Take 1 tablet (25 mg  total) by mouth daily. (Patient not taking: Reported on 07/18/2015) 30 tablet 0  . torsemide (DEMADEX) 20 MG tablet Take 1 tablet (20 mg total) by mouth 2 (two) times daily. 60 tablet 6  . [DISCONTINUED] potassium chloride 20 MEQ TBCR Take 20 mEq by mouth 2 (two) times daily. 10 tablet 0   No current facility-administered medications for this visit.     Past Surgical History  Procedure Laterality Date  . None    . Cardiac catheterization N/A 06/14/2015    Procedure: Right/Left Heart Cath and Coronary Angiography;  Surgeon: Runell Gess, MD;  Location: [facility]VAS[facility] CV LAB;  Service: Cardiovascular;  Laterality: N/A;     Allergies  Allergen Reactions  . Bee Venom Shortness Of Breath and Swelling  . Aspirin Other (See Comments)    Chewable 81 mg tablets upsets  patients stomach  . Banana Diarrhea  . Hydralazine Other (See Comments)    Patient states it causes Tachycardia      Family History  Problem Relation Age of Onset  . Colon cancer Neg Hx   . Inflammatory bowel disease Neg Hx   . Liver disease Neg Hx      Social History [name] reports that he quit smoking about 8 years ago. His smoking use included Cigarettes. He started smoking about 27 years ago. He has a 10 pack-year smoking history. He has never used smokeless tobacco. [name] reports that he does not drink alcohol.   Review of Systems CONSTITUTIONAL: No weight loss, fever, chills, weakness or fatigue.  HEENT: Eyes: No visual loss, blurred vision, double vision or yellow sclerae.No hearing loss, sneezing, congestion, runny nose or sore throat.  SKIN: No rash or itching.  CARDIOVASCULAR: per HPI RESPIRATORY: +SOB  GASTROINTESTINAL: No anorexia, nausea, vomiting or diarrhea. No abdominal pain or blood.  GENITOURINARY: No burning on urination, no polyuria NEUROLOGICAL: No headache, dizziness, syncope, paralysis, ataxia, numbness or tingling in the extremities. No change in bowel or bladder control.  MUSCULOSKELETAL: No muscle, back pain, joint pain or stiffness.  LYMPHATICS: No enlarged nodes. No history of splenectomy.  PSYCHIATRIC: No history of depression or anxiety.  ENDOCRINOLOGIC: No reports of sweating, cold or heat intolerance. No polyuria or polydipsia.  Marland Kitchen   Physical Examination Filed Vitals:   09/04/15 1107  BP: 144/90  Pulse: 100   Filed Vitals:   09/04/15 1107  Height: 6' (1.829 m)  Weight: 313 lb 3.2 oz (142.067 kg)    Gen: resting comfortably, no acute distress HEENT: no scleral icterus, pupils equal round and reactive, no palptable cervical adenopathy,  CV: RRR, no m/r/g, no jvd Resp: Clear to auscultation bilaterally GI: abdomen is soft, non-tender, non-distended, normal bowel sounds, no hepatosplenomegaly MSK: extremities are warm, 1+  bilateral LE edema Skin: warm, no rash Neuro:  no focal deficits Psych: appropriate affect    Assessment and Plan  1. Acute on chronic diastolic HF - evidence of volume overload, significant weight gain - he feels his fluid status did better on lasix, and asks to change back. Will stop torsemide and start lasix  bid. Check BMET and Mg in 2 weeks.   He will f/u in 1 week      Antoine Poche, M.D.

## 2015-09-04 NOTE — ED Notes (Signed)
Pt has bilateral 2+ pitting edema noted. Stated he was given Lasix this morning in the ED and feels like it did not pull enough fluid off.

## 2015-09-04 NOTE — ED Notes (Signed)
Pt resting with eyes closed, appears to be in no distress. Respirations are even and unlabored.  

## 2015-09-04 NOTE — Discharge Instructions (Signed)
Look at the low sodium diet. You can't eat things like pizza, macaroni and cheese and Big Mac's, they are loaded with salt. Take the potassium pills until gone. Recheck as needed.    Heart Failure Heart failure means your heart has trouble pumping blood. This makes it hard for your body to work well. Heart failure is usually a long-term (chronic) condition. You must take good care of yourself and follow your doctor's treatment plan. HOME CARE  Take your heart medicine as told by your doctor.  Do not stop taking medicine unless your doctor tells you to.  Do not skip any dose of medicine.  Refill your medicines before they run out.  Take other medicines only as told by your doctor or pharmacist.  Stay active if told by your doctor. The elderly and people with severe heart failure should talk with a doctor about physical activity.  Eat heart-healthy foods. Choose foods that are without trans fat and are low in saturated fat, cholesterol, and salt (sodium). This includes fresh or frozen fruits and vegetables, fish, lean meats, fat-free or low-fat dairy foods, whole grains, and high-fiber foods. Lentils and dried peas and beans (legumes) are also good choices.  Limit salt if told by your doctor.  Cook in a healthy way. Roast, grill, broil, bake, poach, steam, or stir-fry foods.  Limit fluids as told by your doctor.  Weigh yourself every morning. Do this after you pee (urinate) and before you eat breakfast. Write down your weight to give to your doctor.  Take your blood pressure and write it down if your doctor tells you to.  Ask your doctor how to check your pulse. Check your pulse as told.  Lose weight if told by your doctor.  Stop smoking or chewing tobacco. Do not use gum or patches that help you quit without your doctor's approval.  Schedule and go to doctor visits as told.  Nonpregnant women should have no more than 1 drink a day. Men should have no more than 2 drinks a day.  Talk to your doctor about drinking alcohol.  Stop illegal drug use.  Stay current with shots (immunizations).  Manage your health conditions as told by your doctor.  Learn to manage your stress.  Rest when you are tired.  If it is really hot outside:  Avoid intense activities.  Use air conditioning or fans, or get in a cooler place.  Avoid caffeine and alcohol.  Wear loose-fitting, lightweight, and light-colored clothing.  If it is really cold outside:  Avoid intense activities.  Layer your clothing.  Wear mittens or gloves, a hat, and a scarf when going outside.  Avoid alcohol.  Learn about heart failure and get support as needed.  Get help to maintain or improve your quality of life and your ability to care for yourself as needed. GET HELP IF:   You gain weight quickly.  You are more short of breath than usual.  You cannot do your normal activities.  You tire easily.  You cough more than normal, especially with activity.  You have any or more puffiness (swelling) in areas such as your hands, feet, ankles, or belly (abdomen).  You cannot sleep because it is hard to breathe.  You feel like your heart is beating fast (palpitations).  You get dizzy or light-headed when you stand up. GET HELP RIGHT AWAY IF:   You have trouble breathing.  There is a change in mental status, such as becoming less alert or not  being able to focus.  You have chest pain or discomfort.  You faint. MAKE SURE YOU:   Understand these instructions.  Will watch your condition.  Will get help right away if you are not doing well or get worse.   This information is not intended to replace advice given to you by your health care provider. Make sure you discuss any questions you have with your health care provider.   Document Released: 03/17/2008 Document Revised: 06/29/2014 Document Reviewed: 07/25/2012 Elsevier Interactive Patient Education [employer]

## 2015-09-04 NOTE — Patient Instructions (Signed)
Your physician recommends that you schedule a follow-up appointment in: 1 WEEK WITH AN EXTENDER IN    Your physician has recommended you make the following change in your medication:   STOP TORSEMIDE   START LASIX 40 MG TWICE DAILY  Your physician recommends that you return for lab work in: 1 WEEK BMP/MG - PLEASE HAVE LABS DONE PRIOR TO YOUR FOLLOW UP APPOINTMENT.  Thank you for choosing [facility]!!

## 2015-09-04 NOTE — ED Provider Notes (Signed)
CSN: [id number]     Arrival date & time 09/03/15  2230 History   First MD Initiated Contact with Patient 09/04/15 0040   Chief Complaint  Patient presents with  . Shortness of Breath     (Consider location/radiation/quality/duration/timing/severity/associated sxs/prior Treatment) HPI patient states he was seen in the ED earlier today and was given Lasix and had a lot of excess fluid, out and he was feeling better. He states shortly after he left the ER he ate pizza. Later on the day he ate some hamburger helper and fruit cocktail. And then later this evening he ate half of a big Mac when he started to feel too full to eat more. He states about an hour ago he felt like his feet were swelling again, his legs are swollen in his abdomen swollen. He states he feels short of breath and his chest feels tight. He states he is having shaking which she normally has when he takes potassium pills.   PCP [facility] Cardiology Dr Darl Householder [facility] office)  Past Medical History  Diagnosis Date  . Hypertensive heart disease     Hypertensive heart disease with prior episodes of CHF  . Hyperlipidemia   . Hypertension   . Gastroesophageal reflux disease   . Osteoarthritis   . Peptic ulcer disease   . Morbid obesity (HCC)     /sleep apnea  . Allergic rhinitis     plus h/o asthma  . Tobacco abuse, in remission     Cigarettes discontinued in 2010  . CHF (congestive heart failure) (HCC)   . A-fib (HCC)     2015  . Asthma    Past Surgical History  Procedure Laterality Date  . None    . Cardiac catheterization N/A 06/14/2015    Procedure: Right/Left Heart Cath and Coronary Angiography;  Surgeon: Runell Gess, MD;  Location: [facility] INVASIVE CV LAB;  Service: Cardiovascular;  Laterality: N/A;   Family History  Problem Relation Age of Onset  . Colon cancer Neg Hx   . Inflammatory bowel disease Neg Hx   . Liver disease Neg Hx    Social History  Substance Use Topics  . Smoking status: Former  Smoker -- 0.50 packs/day for 20 years    Types: Cigarettes    Start date: 04/26/1988    Quit date: 06/23/2007  . Smokeless tobacco: Never Used     Comment: 1 ppd former smoker  . Alcohol Use: No     Comment: no etoh since 2009  lives with [facility] and his 2 children  Review of Systems  All other systems reviewed and are negative.     Allergies  Bee venom; Aspirin; Banana; and Hydralazine  Home Medications   Prior to Admission medications   Medication Sig Start Date End Date Taking? Authorizing Provider  apixaban (ELIQUIS) 5 MG TABS tablet Take 1 tablet (5 mg total) by mouth 2 (two) times daily. Patient not taking: Reported on 07/08/2015 06/17/15   [name], MD  aspirin EC 81 MG EC tablet Take 1 tablet (81 mg total) by mouth daily. Patient taking differently: Take 81 mg by mouth daily as needed for pain.  06/17/15   [name], MD  atorvastatin (LIPITOR) 80 MG tablet Take 1 tablet (80 mg total) by mouth daily at 6 PM. Patient not taking: Reported on 07/08/2015 06/17/15   [name], MD  beclomethasone (QVAR) 80 MCG/ACT inhaler Inhale 2 puffs into the lungs daily.     Historical Provider,  MD  Calcium Carbonate Antacid (ALKA-SELTZER ANTACID PO) Take 1 tablet by mouth as needed (upset stomach).    Historical Provider, MD  cloNIDine (CATAPRES) 0.1 MG tablet Take 0.1 mg by mouth daily.     Historical Provider, MD  diltiazem (CARDIZEM CD) 360 MG 24 hr capsule Take 1 capsule (360 mg total) by mouth daily. Patient not taking: Reported on 08/02/2015 07/08/15   [name], MD  levofloxacin (LEVAQUIN) 500 MG tablet Take 500 mg by mouth daily.    Historical Provider, MD  losartan (COZAAR) 50 MG tablet Take 1 tablet (50 mg total) by mouth daily. Patient not taking: Reported on 07/08/2015 06/17/15   [name], MD  metoprolol succinate (TOPROL-XL) 50 MG 24 hr tablet Take 1 tablet (50 mg total) by mouth daily. Take with or immediately following a meal. Patient  taking differently: Take 50 mg by mouth every other day. Take with or immediately following a meal. 06/17/15   [name], MD  potassium chloride SA (K-DUR,KLOR-CON) 20 MEQ tablet Take 1 tablet (20 mEq total) by mouth 2 (two) times daily. 09/04/15   Devoria Albe, MD  spironolactone (ALDACTONE) 25 MG tablet Take 1 tablet (25 mg total) by mouth daily. Patient not taking: Reported on 07/18/2015 06/17/15   [name], MD  torsemide (DEMADEX) 20 MG tablet Take 1 tablet (20 mg total) by mouth 2 (two) times daily. 07/08/15   [name], MD   BP 124/105 mmHg  Pulse 93  Temp(Src) 97.9 F (36.6 C) (Oral)  Resp 21  SpO2 97%  Vital signs normal   Physical Exam  Constitutional: He is oriented to person, place, and time. He appears well-developed and well-nourished.  Non-toxic appearance. He does not appear ill. No distress.  Morbidly obese  HENT:  Head: Normocephalic and atraumatic.  Right Ear: External ear normal.  Left Ear: External ear normal.  Nose: Nose normal. No mucosal edema or rhinorrhea.  Mouth/Throat: Oropharynx is clear and moist and mucous membranes are normal. No dental abscesses or uvula swelling.  Eyes: Conjunctivae and EOM are normal. Pupils are equal, round, and reactive to light.  Neck: Normal range of motion and full passive range of motion without pain. Neck supple.  Cardiovascular: Normal rate, regular rhythm and normal heart sounds.  Exam reveals no gallop and no friction rub.   No murmur heard. Pulmonary/Chest: Effort normal and breath sounds normal. No respiratory distress. He has no wheezes. He has no rhonchi. He has no rales. He exhibits no tenderness and no crepitus.  Abdominal: Soft. Normal appearance and bowel sounds are normal. He exhibits no distension. There is no tenderness. There is no rebound and no guarding.  Abdomen may be mildly distended but it's very soft.  Musculoskeletal: Normal range of motion. He exhibits edema. He exhibits no  tenderness.  Moves all extremities well. Patient's noted to have some pitting edema up to his knees and very minimally in the lower thighs.  Neurological: He is alert and oriented to person, place, and time. He has normal strength. No cranial nerve deficit.  Skin: Skin is warm, dry and intact. No rash noted. No erythema. No pallor.  Psychiatric: He has a normal mood and affect. His speech is normal and behavior is normal. His mood appears not anxious.  Nursing note and vitals reviewed.   ED Course  Procedures (including critical care time)  Medications  furosemide (LASIX) injection 80 mg (80 mg Intravenous Given 09/04/15 0100)  potassium chloride SA (K-DUR,KLOR-CON)  CR tablet 40 mEq (40 mEq Oral Given 09/04/15 0059)    Patient was given IV Lasix and oral potassium. Patient he was rechecked a few times. Every time I went to the room there was a urine in his urinal.  3:15 AM patient has had 1500 mL of urinary output. His BMP is improved from earlier today. His potassium is improved. I will give patient a low sodium diet to look at. I think part of his problem is his dietary noncompliance. At this point patient was felt to be stable to be discharged. He has had a pulse ox of the lowest of 93% on room air and at time of discharge she was up to 100%.   Labs Review Results for orders placed or performed during the hospital encounter of 09/03/15  Brain natriuretic peptide  Result Value Ref Range   B Natriuretic Peptide 313.0 (H) 0.0 - 100.0 pg/mL  Basic metabolic panel  Result Value Ref Range   Sodium 140 135 - 145 mmol/L   Potassium 3.4 (L) 3.5 - 5.1 mmol/L   Chloride 105 101 - 111 mmol/L   CO2 27 22 - 32 mmol/L   Glucose, Bld 91 65 - 99 mg/dL   BUN 26 (H) 6 - 20 mg/dL   Creatinine, Ser 2.84 0.61 - 1.24 mg/dL   Calcium 8.7 (L) 8.9 - 10.3 mg/dL   GFR calc non Af Amer >60 >60 mL/min   GFR calc Af Amer >60 >60 mL/min   Anion gap 8 5 - 15   Laboratory interpretation all normal except  improving BNP and hypokalemia from earlier today      Imaging Review Dg Chest 2 View  09/03/2015  CLINICAL DATA:  Shortness of breath for 1 week. History of asthma, atrial fibrillation, CHF, hypertension, hyperlipidemia, morbid obesity. EXAM: CHEST  2 VIEW COMPARISON:  Chest radiograph August 02, 2015 FINDINGS: The cardiac silhouette appears moderately enlarged, similar. Mediastinal silhouette is nonsuspicious. Pulmonary vascular congestion, without pleural effusion or focal consolidation. Improved aeration of RIGHT lung base. No pneumothorax. Large body habitus. Osseous structure nonsuspicious. IMPRESSION: Similar cardiomegaly and pulmonary vascular congestion. Electronically Signed   By: Awilda Metro M.D.   On: 09/03/2015 06:59   I have personally reviewed and evaluated these images and lab results as part of my medical decision-making.   EKG Interpretation   Date/Time:  Wednesday September 04 2015 00:25:03 EDT Ventricular Rate:  91 PR Interval:    QRS Duration: 90 QT Interval:  399 QTC Calculation: 491 R Axis:   66 Text Interpretation:  Atrial fibrillation Ventricular premature complex  Borderline repolarization abnormality Borderline prolonged QT interval No  significant change since last tracing 03 Sep 2015 Confirmed by Davidjames Blansett   MD-I, Peachie Barkalow ([id number]) on 09/04/2015 1:07:40 AM      MDM   Final diagnoses:  Acute on chronic systolic congestive heart failure (HCC)  Dietary noncompliance    New Prescriptions   POTASSIUM CHLORIDE SA (K-DUR,KLOR-CON) 20 MEQ TABLET    Take 1 tablet (20 mEq total) by mouth 2 (two) times daily.    Plan discharge  Devoria Albe, MD, Concha Pyo, MD 09/04/15 0330

## 2015-09-08 ENCOUNTER — Emergency Department (HOSPITAL_COMMUNITY)
Admission: EM | Admit: 2015-09-08 | Discharge: 2015-09-09 | Disposition: A | Discharge status: Home / Self Care | Payer: Medicare Other | Source: Home / Self Care | Attending: Emergency Medicine | Admitting: Emergency Medicine

## 2015-09-08 ENCOUNTER — Emergency Department (HOSPITAL_COMMUNITY): Payer: Medicare Other

## 2015-09-08 ENCOUNTER — Encounter (HOSPITAL_COMMUNITY): Payer: Self-pay | Admitting: Emergency Medicine

## 2015-09-08 DIAGNOSIS — I110 Hypertensive heart disease with heart failure: Secondary | ICD-10-CM | POA: Insufficient documentation

## 2015-09-08 DIAGNOSIS — I482 Chronic atrial fibrillation, unspecified: Secondary | ICD-10-CM

## 2015-09-08 DIAGNOSIS — I509 Heart failure, unspecified: Secondary | ICD-10-CM | POA: Insufficient documentation

## 2015-09-08 DIAGNOSIS — E876 Hypokalemia: Secondary | ICD-10-CM | POA: Insufficient documentation

## 2015-09-08 DIAGNOSIS — Z87891 Personal history of nicotine dependence: Secondary | ICD-10-CM

## 2015-09-08 DIAGNOSIS — M179 Osteoarthritis of knee, unspecified: Secondary | ICD-10-CM

## 2015-09-08 DIAGNOSIS — E785 Hyperlipidemia, unspecified: Secondary | ICD-10-CM | POA: Insufficient documentation

## 2015-09-08 DIAGNOSIS — J45909 Unspecified asthma, uncomplicated: Secondary | ICD-10-CM | POA: Insufficient documentation

## 2015-09-08 DIAGNOSIS — I5033 Acute on chronic diastolic (congestive) heart failure: Secondary | ICD-10-CM

## 2015-09-08 LAB — CBC WITH DIFFERENTIAL/PLATELET
BASOS ABS: 0 10*3/uL (ref 0.0–0.1)
BASOS PCT: 1 %
EOS PCT: 2 %
Eosinophils Absolute: 0.1 10*3/uL (ref 0.0–0.7)
HEMATOCRIT: 36 % — AB (ref 39.0–52.0)
Hemoglobin: 11.9 g/dL — ABNORMAL LOW (ref 13.0–17.0)
LYMPHS PCT: 22 %
Lymphs Abs: 1 10*3/uL (ref 0.7–4.0)
MCH: 28.7 pg (ref 26.0–34.0)
MCHC: 33.1 g/dL (ref 30.0–36.0)
MCV: 87 fL (ref 78.0–100.0)
MONO ABS: 0.4 10*3/uL (ref 0.1–1.0)
Monocytes Relative: 9 %
NEUTROS ABS: 3 10*3/uL (ref 1.7–7.7)
Neutrophils Relative %: 68 %
PLATELETS: 213 10*3/uL (ref 150–400)
RBC: 4.14 MIL/uL — AB (ref 4.22–5.81)
RDW: 14.7 % (ref 11.5–15.5)
WBC: 4.4 10*3/uL (ref 4.0–10.5)

## 2015-09-08 NOTE — ED Notes (Signed)
Patient has had increased swelling in legs and feet, has increased Lasix 40 mg by two pills without any relief.

## 2015-09-09 LAB — BASIC METABOLIC PANEL
Anion gap: 6 (ref 5–15)
BUN: 17 mg/dL (ref 6–20)
CALCIUM: 8.3 mg/dL — AB (ref 8.9–10.3)
CO2: 26 mmol/L (ref 22–32)
Chloride: 110 mmol/L (ref 101–111)
Creatinine, Ser: 1.07 mg/dL (ref 0.61–1.24)
Glucose, Bld: 104 mg/dL — ABNORMAL HIGH (ref 65–99)
Potassium: 3.2 mmol/L — ABNORMAL LOW (ref 3.5–5.1)
SODIUM: 142 mmol/L (ref 135–145)

## 2015-09-09 LAB — MAGNESIUM: Magnesium: 2 mg/dL (ref 1.7–2.4)

## 2015-09-09 LAB — BRAIN NATRIURETIC PEPTIDE: B NATRIURETIC PEPTIDE 5: 750 pg/mL — AB (ref 0.0–100.0)

## 2015-09-09 LAB — TROPONIN I: TROPONIN I: 0.04 ng/mL — AB (ref <0.031)

## 2015-09-09 MED ORDER — FUROSEMIDE 10 MG/ML IJ SOLN
40.0000 mg | Freq: Once | INTRAMUSCULAR | Status: AC
Start: 2015-09-09 — End: 2015-09-09
  Administered 2015-09-09: 40 mg via INTRAVENOUS
  Filled 2015-09-09: qty 4

## 2015-09-09 MED ORDER — POTASSIUM CHLORIDE CRYS ER 20 MEQ PO TBCR
40.0000 meq | EXTENDED_RELEASE_TABLET | Freq: Once | ORAL | Status: AC
  Administered 2015-09-09: 40 meq via ORAL
  Filled 2015-09-09: qty 2

## 2015-09-09 NOTE — ED Provider Notes (Signed)
CSN: [id number]     Arrival date & time 09/08/15  2208 History   First MD Initiated Contact with Patient 09/08/15 2244     Chief Complaint  Patient presents with  . Foot Swelling  . Leg Swelling     (Consider location/radiation/quality/duration/timing/severity/associated sxs/prior Treatment) HPI Comments: CC: Lower extremity swelling and SOB  PCP - Dr. Hyman Bower  Cardiologist -  Purvis Sheffield  Patient is a 50 year old male who presents to the emergency department with a complaint of swelling of the lower legs, and shortness of breath.  The patient has a history of congestive heart failure, atrial fibrillation, hypertensive heart disease, hyperlipidemia,  and osteoarthritis.  The patient states that he was seen in the emergency department on March 14 at which time he was treated with IV medications. He began to feel better he was able to be discharged home. He was seen the next day by his cardiology specialist. His Lasix was increased. He was asked to stop his Demadex at this time. The patient states that since that time he has been noticing increasing swelling of his lower extremities. He states that he is having more and more shortness of breath, particularly with minimal exertion. He denies any actual chest pain. He has not had vomiting or loss of consciousness. There's been no high fever reported. The patient is a poor historian, and cannot tell me all of his medications, and which ones he is taking and not taking. He does state however that he is not taking the Cardizem as prescribed, because sometimes it "makes his heart feel funny".                                                                                                                                                                                                                                                           The history is provided by the patient.    Past Medical History  Diagnosis Date  . Hypertensive heart  disease     Hypertensive heart disease with prior episodes of CHF  . Hyperlipidemia   . Hypertension   . Gastroesophageal reflux disease   . Osteoarthritis   . Peptic ulcer disease   . Morbid obesity (HCC)     /sleep apnea  . Allergic rhinitis     plus h/o  asthma  . Tobacco abuse, in remission     Cigarettes discontinued in 2010  . CHF (congestive heart failure) (HCC)   . A-fib (HCC)     2015  . Asthma    Past Surgical History  Procedure Laterality Date  . None    . Cardiac catheterization N/A 06/14/2015    Procedure: Right/Left Heart Cath and Coronary Angiography;  Surgeon: Runell Gess, MD;  Location: [facility] INVASIVE CV LAB;  Service: Cardiovascular;  Laterality: N/A;   Family History  Problem Relation Age of Onset  . Colon cancer Neg Hx   . Inflammatory bowel disease Neg Hx   . Liver disease Neg Hx    Social History  Substance Use Topics  . Smoking status: Former Smoker -- 0.50 packs/day for 20 years    Types: Cigarettes    Start date: 04/26/1988    Quit date: 06/23/2007  . Smokeless tobacco: Never Used     Comment: 1 ppd former smoker  . Alcohol Use: No     Comment: no etoh since 2009    Review of Systems  Constitutional: Positive for fatigue.  Respiratory: Positive for shortness of breath.   Cardiovascular: Positive for palpitations and leg swelling. Negative for chest pain.  Musculoskeletal: Positive for arthralgias.  All other systems reviewed and are negative.     Allergies  Bee venom; Aspirin; Banana; and Hydralazine  Home Medications   Prior to Admission medications   Medication Sig Start Date End Date Taking? Authorizing Provider  apixaban (ELIQUIS) 5 MG TABS tablet Take 1 tablet (5 mg total) by mouth 2 (two) times daily. Patient not taking: Reported on 07/08/2015 06/17/15   [name], MD  aspirin EC 81 MG EC tablet Take 1 tablet (81 mg total) by mouth daily. Patient taking differently: Take 81 mg by mouth daily as needed for pain.   06/17/15   [name], MD  atorvastatin (LIPITOR) 80 MG tablet Take 1 tablet (80 mg total) by mouth daily at 6 PM. Patient not taking: Reported on 07/08/2015 06/17/15   [name], MD  beclomethasone (QVAR) 80 MCG/ACT inhaler Inhale 2 puffs into the lungs daily.     Historical Provider, MD  Calcium Carbonate Antacid (ALKA-SELTZER ANTACID PO) Take 1 tablet by mouth as needed (upset stomach).    Historical Provider, MD  cloNIDine (CATAPRES) 0.1 MG tablet Take 0.1 mg by mouth daily.     Historical Provider, MD  diltiazem (CARDIZEM CD) 360 MG 24 hr capsule Take 1 capsule (360 mg total) by mouth daily. Patient not taking: Reported on 08/02/2015 07/08/15   [name], MD  furosemide (LASIX) 40 MG tablet Take 1 tablet (40 mg total) by mouth 2 (two) times daily. 09/04/15   Antoine Poche, MD  losartan (COZAAR) 50 MG tablet Take 1 tablet (50 mg total) by mouth daily. Patient not taking: Reported on 07/08/2015 06/17/15   [name], MD  metoprolol succinate (TOPROL-XL) 50 MG 24 hr tablet Take 1 tablet (50 mg total) by mouth daily. Take with or immediately following a meal. Patient taking differently: Take 50 mg by mouth every other day. Take with or immediately following a meal. 06/17/15   [name], MD  potassium chloride SA (K-DUR,KLOR-CON) 20 MEQ tablet Take 1 tablet (20 mEq total) by mouth 2 (two) times daily. 09/04/15   Devoria Albe, MD  spironolactone (ALDACTONE) 25 MG tablet Take 1 tablet (25 mg total) by mouth daily. Patient not taking: Reported on  07/18/2015 06/17/15   [name], MD   BP 144/110 mmHg  Pulse 111  Temp(Src) 98.4 F (36.9 C) (Oral)  Resp 20  Ht 6' (1.829 m)  Wt 141.976 kg  BMI 42.44 kg/m2  SpO2 99% Physical Exam  Constitutional: He is oriented to person, place, and time. He appears well-developed and well-nourished.  Non-toxic appearance.  HENT:  Head: Normocephalic.  Right Ear: Tympanic membrane and external ear normal.  Left Ear:  Tympanic membrane and external ear normal.  Eyes: EOM and lids are normal. Pupils are equal, round, and reactive to light.  Neck: Normal range of motion. Neck supple. No JVD present. Carotid bruit is not present. No tracheal deviation present.  Cardiovascular: Normal heart sounds, intact distal pulses and normal pulses.  An irregularly irregular rhythm present. Tachycardia present.  Exam reveals no gallop and no friction rub.   Tachycardia present of 115.  Pulmonary/Chest: Breath sounds normal. No respiratory distress. He has no wheezes. He has no rales.  Abdominal: Soft. Bowel sounds are normal. He exhibits no distension and no mass. There is no tenderness. There is no guarding.  Musculoskeletal: Normal range of motion. He exhibits edema.  2+ pitting edema of the lower extremities from just below the knee to the feet.  Lymphadenopathy:       Head (right side): No submandibular adenopathy present.       Head (left side): No submandibular adenopathy present.    He has no cervical adenopathy.  Neurological: He is alert and oriented to person, place, and time. He has normal strength. No cranial nerve deficit or sensory deficit.  Skin: Skin is warm and dry.  Psychiatric: He has a normal mood and affect. His speech is normal.  Nursing note and vitals reviewed.   ED Course  Pt seen with me by Dr Blinda Leatherwood  Procedures (including critical care time) Labs Review Labs Reviewed  CBC WITH DIFFERENTIAL/PLATELET - Abnormal; Notable for the following:    RBC 4.14 (*)    Hemoglobin 11.9 (*)    HCT 36.0 (*)    All other components within normal limits  BASIC METABOLIC PANEL  BRAIN NATRIURETIC PEPTIDE  TROPONIN I  MAGNESIUM    Imaging Review No results found. I have personally reviewed and evaluated these images and lab results as part of my medical decision-making.   EKG Interpretation None      MDM  Patient has a tachycardia between 111 and 1:15. The blood pressure is elevated at  132/104. The pulse oximetry on room air is from 96-100%.  The patient's weight on March 15 was 142 kg, weight today is 141.9 kg.  The electrocardiogram shows an atrial fibrillation at 117/m. The troponin is slightly elevated at 0.04. This is the level the patient has been at at the last 2 visits to the emergency department. Question a troponin leak.  The potassium is slightly low at 3.2, the anion gap is normal at 6. The patient will be given oral potassium. Heart rate 98.  The been atretic peptide is elevated at 750. Baseline for this patient. The complete blood count is nonacute. The magnesium level is normal at 2.0.  Chest x-ray shows cardiac enlargement and mild pulmonary vascular congestion as well as mild interstitial edema, with swallow fluid in the fissures. Patient was treated with IV Lasix.   Pt sitting up on side of the bed. Less SOB. States he feels some better. He reports good out put from the IV lasix dose. K+ slightly low.  Oral potassium given to the patient. Feel that it is safe for the patient to be d/c home. Encouraged pt to take meds as ordered. Pt to see cardiologist  This week for recheck following ED visit. Pt to return to the ED if any changes or problem.   Final diagnoses:  None    **I have reviewed nursing notes, vital signs, and all appropriate lab and imaging results for this patient.Ivery Quale, PA-C 09/09/15 0135  Ivery Quale, PA-C 09/09/15 1610  Gilda Crease, MD 09/09/15 [phone]

## 2015-09-09 NOTE — ED Notes (Signed)
Electronic signature pad not working

## 2015-09-09 NOTE — Discharge Instructions (Signed)
It is important that you take your medications as suggested by your cardiologist. Please call the cardiologist for office evaluation this week for recheck after ED visit. Return to the Emergency Dept if any changes or problem. Heart Failure Heart failure means your heart has trouble pumping blood. This makes it hard for your body to work well. Heart failure is usually a long-term (chronic) condition. You must take good care of yourself and follow your doctor's treatment plan. HOME CARE  Take your heart medicine as told by your doctor.  Do not stop taking medicine unless your doctor tells you to.  Do not skip any dose of medicine.  Refill your medicines before they run out.  Take other medicines only as told by your doctor or pharmacist.  Stay active if told by your doctor. The elderly and people with severe heart failure should talk with a doctor about physical activity.  Eat heart-healthy foods. Choose foods that are without trans fat and are low in saturated fat, cholesterol, and salt (sodium). This includes fresh or frozen fruits and vegetables, fish, lean meats, fat-free or low-fat dairy foods, whole grains, and high-fiber foods. Lentils and dried peas and beans (legumes) are also good choices.  Limit salt if told by your doctor.  Cook in a healthy way. Roast, grill, broil, bake, poach, steam, or stir-fry foods.  Limit fluids as told by your doctor.  Weigh yourself every morning. Do this after you pee (urinate) and before you eat breakfast. Write down your weight to give to your doctor.  Take your blood pressure and write it down if your doctor tells you to.  Ask your doctor how to check your pulse. Check your pulse as told.  Lose weight if told by your doctor.  Stop smoking or chewing tobacco. Do not use gum or patches that help you quit without your doctor's approval.  Schedule and go to doctor visits as told.  Nonpregnant women should have no more than 1 drink a day. Men  should have no more than 2 drinks a day. Talk to your doctor about drinking alcohol.  Stop illegal drug use.  Stay current with shots (immunizations).  Manage your health conditions as told by your doctor.  Learn to manage your stress.  Rest when you are tired.  If it is really hot outside:  Avoid intense activities.  Use air conditioning or fans, or get in a cooler place.  Avoid caffeine and alcohol.  Wear loose-fitting, lightweight, and light-colored clothing.  If it is really cold outside:  Avoid intense activities.  Layer your clothing.  Wear mittens or gloves, a hat, and a scarf when going outside.  Avoid alcohol.  Learn about heart failure and get support as needed.  Get help to maintain or improve your quality of life and your ability to care for yourself as needed. GET HELP IF:   You gain weight quickly.  You are more short of breath than usual.  You cannot do your normal activities.  You tire easily.  You cough more than normal, especially with activity.  You have any or more puffiness (swelling) in areas such as your hands, feet, ankles, or belly (abdomen).  You cannot sleep because it is hard to breathe.  You feel like your heart is beating fast (palpitations).  You get dizzy or light-headed when you stand up. GET HELP RIGHT AWAY IF:   You have trouble breathing.  There is a change in mental status, such as becoming less alert  or not being able to focus.  You have chest pain or discomfort.  You faint. MAKE SURE YOU:   Understand these instructions.  Will watch your condition.  Will get help right away if you are not doing well or get worse.   This information is not intended to replace advice given to you by your health care provider. Make sure you discuss any questions you have with your health care provider.   Document Released: 03/17/2008 Document Revised: 06/29/2014 Document Reviewed: 07/25/2012 Elsevier Interactive Patient  Education [employer]

## 2015-09-10 ENCOUNTER — Encounter (HOSPITAL_COMMUNITY): Payer: Self-pay

## 2015-09-10 ENCOUNTER — Emergency Department (HOSPITAL_COMMUNITY): Payer: Medicare Other

## 2015-09-10 ENCOUNTER — Inpatient Hospital Stay (HOSPITAL_COMMUNITY)
Admission: EM | Admit: 2015-09-10 | Discharge: 2015-09-15 | DRG: 291 | Disposition: A | Discharge status: Home / Self Care | Payer: Medicare Other | Attending: Internal Medicine | Admitting: Internal Medicine

## 2015-09-10 DIAGNOSIS — J9601 Acute respiratory failure with hypoxia: Secondary | ICD-10-CM

## 2015-09-10 DIAGNOSIS — E785 Hyperlipidemia, unspecified: Secondary | ICD-10-CM | POA: Diagnosis present

## 2015-09-10 DIAGNOSIS — K219 Gastro-esophageal reflux disease without esophagitis: Secondary | ICD-10-CM | POA: Diagnosis present

## 2015-09-10 DIAGNOSIS — I5031 Acute diastolic (congestive) heart failure: Secondary | ICD-10-CM | POA: Diagnosis present

## 2015-09-10 DIAGNOSIS — E876 Hypokalemia: Secondary | ICD-10-CM | POA: Diagnosis present

## 2015-09-10 DIAGNOSIS — I480 Paroxysmal atrial fibrillation: Secondary | ICD-10-CM | POA: Diagnosis not present

## 2015-09-10 DIAGNOSIS — Z87891 Personal history of nicotine dependence: Secondary | ICD-10-CM | POA: Diagnosis not present

## 2015-09-10 DIAGNOSIS — G4730 Sleep apnea, unspecified: Secondary | ICD-10-CM | POA: Diagnosis present

## 2015-09-10 DIAGNOSIS — R600 Localized edema: Secondary | ICD-10-CM

## 2015-09-10 DIAGNOSIS — Z9114 Patient's other noncompliance with medication regimen: Secondary | ICD-10-CM | POA: Diagnosis not present

## 2015-09-10 DIAGNOSIS — Z9119 Patient's noncompliance with other medical treatment and regimen: Secondary | ICD-10-CM

## 2015-09-10 DIAGNOSIS — J45909 Unspecified asthma, uncomplicated: Secondary | ICD-10-CM | POA: Diagnosis present

## 2015-09-10 DIAGNOSIS — I482 Chronic atrial fibrillation, unspecified: Secondary | ICD-10-CM

## 2015-09-10 DIAGNOSIS — I4891 Unspecified atrial fibrillation: Secondary | ICD-10-CM | POA: Diagnosis present

## 2015-09-10 DIAGNOSIS — Z8711 Personal history of peptic ulcer disease: Secondary | ICD-10-CM | POA: Diagnosis not present

## 2015-09-10 DIAGNOSIS — Z7951 Long term (current) use of inhaled steroids: Secondary | ICD-10-CM

## 2015-09-10 DIAGNOSIS — Z6841 Body Mass Index (BMI) 40.0 and over, adult: Secondary | ICD-10-CM

## 2015-09-10 DIAGNOSIS — I248 Other forms of acute ischemic heart disease: Secondary | ICD-10-CM | POA: Diagnosis present

## 2015-09-10 DIAGNOSIS — I10 Essential (primary) hypertension: Secondary | ICD-10-CM | POA: Diagnosis present

## 2015-09-10 DIAGNOSIS — I5033 Acute on chronic diastolic (congestive) heart failure: Secondary | ICD-10-CM | POA: Diagnosis present

## 2015-09-10 DIAGNOSIS — Z7901 Long term (current) use of anticoagulants: Secondary | ICD-10-CM | POA: Diagnosis not present

## 2015-09-10 DIAGNOSIS — M1990 Unspecified osteoarthritis, unspecified site: Secondary | ICD-10-CM | POA: Diagnosis present

## 2015-09-10 DIAGNOSIS — I509 Heart failure, unspecified: Secondary | ICD-10-CM

## 2015-09-10 DIAGNOSIS — I110 Hypertensive heart disease with heart failure: Secondary | ICD-10-CM | POA: Diagnosis present

## 2015-09-10 DIAGNOSIS — R509 Fever, unspecified: Secondary | ICD-10-CM

## 2015-09-10 LAB — CBC
HEMATOCRIT: 35.2 % — AB (ref 39.0–52.0)
Hemoglobin: 11.6 g/dL — ABNORMAL LOW (ref 13.0–17.0)
MCH: 28.6 pg (ref 26.0–34.0)
MCHC: 33 g/dL (ref 30.0–36.0)
MCV: 86.9 fL (ref 78.0–100.0)
Platelets: 225 10*3/uL (ref 150–400)
RBC: 4.05 MIL/uL — ABNORMAL LOW (ref 4.22–5.81)
RDW: 14.8 % (ref 11.5–15.5)
WBC: 5.4 10*3/uL (ref 4.0–10.5)

## 2015-09-10 LAB — COMPREHENSIVE METABOLIC PANEL
ALT: 21 U/L (ref 17–63)
AST: 25 U/L (ref 15–41)
Albumin: 3.5 g/dL (ref 3.5–5.0)
Alkaline Phosphatase: 65 U/L (ref 38–126)
Anion gap: 10 (ref 5–15)
BILIRUBIN TOTAL: 2.2 mg/dL — AB (ref 0.3–1.2)
BUN: 18 mg/dL (ref 6–20)
CO2: 24 mmol/L (ref 22–32)
CREATININE: 1.08 mg/dL (ref 0.61–1.24)
Calcium: 8.6 mg/dL — ABNORMAL LOW (ref 8.9–10.3)
Chloride: 109 mmol/L (ref 101–111)
GFR calc Af Amer: >60 mL/min (ref >60)
Glucose, Bld: 94 mg/dL (ref 65–99)
POTASSIUM: 3.3 mmol/L — AB (ref 3.5–5.1)
Sodium: 143 mmol/L (ref 135–145)
TOTAL PROTEIN: 6.1 g/dL — AB (ref 6.5–8.1)

## 2015-09-10 LAB — BRAIN NATRIURETIC PEPTIDE: B NATRIURETIC PEPTIDE 5: 887 pg/mL — AB (ref 0.0–100.0)

## 2015-09-10 LAB — TROPONIN I
Troponin I: 0.05 ng/mL — ABNORMAL HIGH (ref <0.031)
Troponin I: 0.04 ng/mL — ABNORMAL HIGH (ref <0.031)

## 2015-09-10 MED ORDER — CLONIDINE HCL 0.1 MG PO TABS
0.1000 mg | ORAL_TABLET | Freq: Every day | ORAL | Status: DC
  Administered 2015-09-10 – 2015-09-12 (×3): 0.1 mg via ORAL
  Filled 2015-09-10 (×4): qty 1

## 2015-09-10 MED ORDER — POTASSIUM CHLORIDE CRYS ER 20 MEQ PO TBCR
40.0000 meq | EXTENDED_RELEASE_TABLET | Freq: Once | ORAL | Status: AC
  Administered 2015-09-10: 40 meq via ORAL
  Filled 2015-09-10: qty 2

## 2015-09-10 MED ORDER — POTASSIUM CHLORIDE CRYS ER 20 MEQ PO TBCR
20.0000 meq | EXTENDED_RELEASE_TABLET | Freq: Two times a day (BID) | ORAL | Status: DC
  Administered 2015-09-11 – 2015-09-15 (×9): 20 meq via ORAL
  Filled 2015-09-10 (×11): qty 1

## 2015-09-10 MED ORDER — FUROSEMIDE 10 MG/ML IJ SOLN
40.0000 mg | Freq: Two times a day (BID) | INTRAMUSCULAR | Status: DC
  Administered 2015-09-10 – 2015-09-14 (×8): 40 mg via INTRAVENOUS
  Filled 2015-09-10 (×8): qty 4

## 2015-09-10 MED ORDER — APIXABAN 5 MG PO TABS
5.0000 mg | ORAL_TABLET | Freq: Two times a day (BID) | ORAL | Status: DC
  Administered 2015-09-10 – 2015-09-11 (×3): 5 mg via ORAL
  Filled 2015-09-10 (×4): qty 1

## 2015-09-10 MED ORDER — ACETAMINOPHEN 325 MG PO TABS
650.0000 mg | ORAL_TABLET | ORAL | Status: DC | PRN
  Administered 2015-09-15: 650 mg via ORAL
  Filled 2015-09-10: qty 2

## 2015-09-10 MED ORDER — FUROSEMIDE 10 MG/ML IJ SOLN
40.0000 mg | Freq: Once | INTRAMUSCULAR | Status: AC
  Administered 2015-09-10: 40 mg via INTRAVENOUS
  Filled 2015-09-10: qty 4

## 2015-09-10 MED ORDER — SODIUM CHLORIDE 0.9 % IV SOLN: 250.0000 mL | INTRAVENOUS | Status: DC | PRN

## 2015-09-10 MED ORDER — BECLOMETHASONE DIPROPIONATE 80 MCG/ACT IN AERS: 2.0000 | INHALATION_SPRAY | Freq: Every day | RESPIRATORY_TRACT | Status: DC

## 2015-09-10 MED ORDER — METOPROLOL SUCCINATE ER 50 MG PO TB24
50.0000 mg | ORAL_TABLET | Freq: Every day | ORAL | Status: DC
  Administered 2015-09-11 – 2015-09-12 (×3): 50 mg via ORAL
  Filled 2015-09-10 (×3): qty 1

## 2015-09-10 MED ORDER — SODIUM CHLORIDE 0.9% FLUSH
3.0000 mL | INTRAVENOUS | Status: DC | PRN
  Administered 2015-09-13: 3 mL via INTRAVENOUS
  Filled 2015-09-10: qty 3

## 2015-09-10 MED ORDER — ASPIRIN EC 81 MG PO TBEC
81.0000 mg | DELAYED_RELEASE_TABLET | Freq: Every day | ORAL | Status: DC
  Administered 2015-09-10 – 2015-09-11 (×2): 81 mg via ORAL
  Filled 2015-09-10 (×3): qty 1

## 2015-09-10 MED ORDER — BUDESONIDE 0.25 MG/2ML IN SUSP
0.2500 mg | Freq: Two times a day (BID) | RESPIRATORY_TRACT | Status: DC
  Administered 2015-09-10 – 2015-09-14 (×8): 0.25 mg via RESPIRATORY_TRACT
  Filled 2015-09-10 (×9): qty 2

## 2015-09-10 MED ORDER — METOPROLOL TARTRATE 1 MG/ML IV SOLN
5.0000 mg | Freq: Once | INTRAVENOUS | Status: AC
  Administered 2015-09-10: 5 mg via INTRAVENOUS
  Filled 2015-09-10: qty 5

## 2015-09-10 MED ORDER — SODIUM CHLORIDE 0.9% FLUSH
3.0000 mL | Freq: Two times a day (BID) | INTRAVENOUS | Status: DC
  Administered 2015-09-10 – 2015-09-15 (×10): 3 mL via INTRAVENOUS

## 2015-09-10 MED ORDER — ONDANSETRON HCL 4 MG/2ML IJ SOLN
4.0000 mg | Freq: Four times a day (QID) | INTRAMUSCULAR | Status: DC | PRN
  Filled 2015-09-10: qty 2

## 2015-09-10 NOTE — ED Notes (Signed)
Nurse to call for report. Attempted to call twice.

## 2015-09-10 NOTE — H&P (Signed)
History and Physical  [name]:[id number] DOB: [birth year] DOA: 09/10/2015  Referring physician: Kirtland Bouchard. Sophia, PAC PCP: [name]   Chief Complaint: swelling  HPI:  50 year old male with history of diastolic heart failure, noncompliance presented with increasing shortness of breath and leg swelling. Chest x-ray revealed CHF and he was admitted for diuresis and further treatment.  Patient's been followed by cardiology for diastolic heart failure noncompliance has been noted. Patient does not consistently take his medications. Last several bases noted increasing lower extremity edema right greater than left as well as progressive dyspnea on exertion with only mild improvement at rest. He's had no chest pain. He reports that he does not add salt to his diet.  In the emergency department: temp 100.3, RR 30, SpO2 100% Pertinent labs: Potassium 3.3. Total bilirubin 2.2. Remainder CMP unremarkable. BNP 887. Troponin 0.05. CBC unremarkable. EKG: Independently reviewed. Afib RVR, no acute changes. Imaging: CXR independently reviewed. Worsening CHF.  Review of Systems:  Negative for fever, visual changes, rash, new muscle aches, chest pain, dysuria, bleeding, n/v/abdominal pain.  Positive for sore throat.  Past Medical History  Diagnosis Date  . Hypertensive heart disease     Hypertensive heart disease with prior episodes of CHF  . Hyperlipidemia   . Hypertension   . Gastroesophageal reflux disease   . Osteoarthritis   . Peptic ulcer disease   . Morbid obesity (HCC)     /sleep apnea  . Allergic rhinitis     plus h/o asthma  . Tobacco abuse, in remission     Cigarettes discontinued in 2010  . CHF (congestive heart failure) (HCC)   . A-fib (HCC)     2015  . Asthma     Past Surgical History  Procedure Laterality Date  . None    . Cardiac catheterization N/A 06/14/2015    Procedure: Right/Left Heart Cath and Coronary Angiography;  Surgeon: Runell Gess,  MD;  Location: [facility] INVASIVE CV LAB;  Service: Cardiovascular;  Laterality: N/A;    Social History:  reports that he quit smoking about 8 years ago. His smoking use included Cigarettes. He started smoking about 27 years ago. He has a 10 pack-year smoking history. He has never used smokeless tobacco. He reports that he does not drink alcohol or use illicit drugs.    Allergies  Allergen Reactions  . Bee Venom Shortness Of Breath and Swelling  . Aspirin Other (See Comments)    Chewable 81 mg tablets upsets patients stomach  . Banana Diarrhea  . Diltiazem Other (See Comments)    Makes heart beat fast  . Hydralazine Other (See Comments)    Patient states it causes Tachycardia  . Lipitor [Atorvastatin] Other (See Comments)    Nose bleed    Family History  Problem Relation Age of Onset  . Colon cancer Neg Hx   . Inflammatory bowel disease Neg Hx   . Liver disease Neg Hx      Prior to Admission medications   Medication Sig Start Date End Date Taking? Authorizing Provider  apixaban (ELIQUIS) 5 MG TABS tablet Take 1 tablet (5 mg total) by mouth 2 (two) times daily. 06/17/15  Yes [name], MD  aspirin EC 81 MG EC tablet Take 1 tablet (81 mg total) by mouth daily. Patient taking differently: Take 81 mg by mouth daily as needed for pain.  06/17/15  Yes [name], MD  beclomethasone (QVAR) 80 MCG/ACT inhaler Inhale 2 puffs into the lungs  daily.    Yes Historical Provider, MD  cloNIDine (CATAPRES) 0.1 MG tablet Take 0.1 mg by mouth daily.    Yes Historical Provider, MD  furosemide (LASIX) 40 MG tablet Take 1 tablet (40 mg total) by mouth 2 (two) times daily. 09/04/15  Yes Antoine Poche, MD  metoprolol succinate (TOPROL-XL) 50 MG 24 hr tablet Take 1 tablet (50 mg total) by mouth daily. Take with or immediately following a meal. Patient taking differently: Take 50 mg by mouth every other day. Take with or immediately following a meal. 06/17/15  Yes [name], MD    potassium chloride SA (K-DUR,KLOR-CON) 20 MEQ tablet Take 1 tablet (20 mEq total) by mouth 2 (two) times daily. 09/04/15  Yes Devoria Albe, MD  atorvastatin (LIPITOR) 80 MG tablet Take 1 tablet (80 mg total) by mouth daily at 6 PM. Patient not taking: Reported on 07/08/2015 06/17/15   [name], MD  Calcium Carbonate Antacid (ALKA-SELTZER ANTACID PO) Take 1 tablet by mouth as needed (upset stomach).    Historical Provider, MD  diltiazem (CARDIZEM CD) 360 MG 24 hr capsule Take 1 capsule (360 mg total) by mouth daily. Patient not taking: Reported on 08/02/2015 07/08/15   [name], MD  losartan (COZAAR) 50 MG tablet Take 1 tablet (50 mg total) by mouth daily. Patient not taking: Reported on 07/08/2015 06/17/15   [name], MD  spironolactone (ALDACTONE) 25 MG tablet Take 1 tablet (25 mg total) by mouth daily. Patient not taking: Reported on 07/18/2015 06/17/15   [name], MD   Physical Exam: Filed Vitals:   09/10/15 1330 09/10/15 1400 09/10/15 1500 09/10/15 1616  BP: 147/121 111/92 158/109 157/108  Pulse: 68 89 54 107  Temp:      TempSrc:      Resp: 32 24 24 29   Height:      Weight:      SpO2: 99% 93% 95% 97%   Constitutional:  . Appears calm and comfortable Eyes:  . Pupils and irises appear normal . Normal lids ENMT:  . external ears, nose appear normal . grossly normal hearing Neck:  . neck appears normal, no masses, normal ROM . no thyromegaly Respiratory:  . CTA bilaterally, no w/r/r.  . Respiratory effort normal. No retractions or accessory muscle use Cardiovascular:  . Irregular, normal rate, no m/r/g . Right greater than left bilateral lower extremity edema 2-3 plus Abdomen:  . Abdomen appears normal; no tenderness or masses Musculoskeletal:  . Digits/nails off and: no clubbing, cyanosis, petechiae, infection . RUE, LUE, RLE, LLE   o strength and tone appear normal normal, no atrophy, no abnormal movements o No tenderness, masses Skin:   . No rashes, lesions, ulcers noted . palpation of skin: no induration or nodules Neurologic:  . Appears grossly normal Psychiatric:  . judgement and insight appear poor . Mental status o Mood, affect appropriate  Wt Readings from Last 3 Encounters:  09/10/15 141.976 kg (313 lb)  09/08/15 141.976 kg (313 lb)  09/04/15 142.067 kg (313 lb 3.2 oz)    Labs on Admission:  Basic Metabolic Panel:  Recent Labs Lab 09/04/15 0030 09/08/15 2341 09/10/15 1118  NA 140 142 143  K 3.4* 3.2* 3.3*  CL 105 110 109  CO2 27 26 24   GLUCOSE 91 104* 94  BUN 26* 17 18  CREATININE 1.23 1.07 1.08  CALCIUM 8.7* 8.3* 8.6*  MG  --  2.0  --     Liver Function Tests:  Recent Labs Lab 09/10/15 1118  AST 25  ALT 21  ALKPHOS 65  BILITOT 2.2*  PROT 6.1*  ALBUMIN 3.5    CBC:  Recent Labs Lab 09/08/15 2341 09/10/15 1118  WBC 4.4 5.4  NEUTROABS 3.0  --   HGB 11.9* 11.6*  HCT 36.0* 35.2*  MCV 87.0 86.9  PLT 213 225    Cardiac Enzymes:  Recent Labs Lab 09/08/15 2341 09/10/15 1118  TROPONINI 0.04* 0.05*     Radiological Exams on Admission: Dg Chest 2 View  09/10/2015  CLINICAL DATA:  Short of breath.  Lower extremity swelling. EXAM: CHEST  2 VIEW COMPARISON:  09/08/2015 FINDINGS: Cardiomegaly. Moderate pulmonary edema. No pneumothorax. No pleural effusion. IMPRESSION: Worsening CHF. Electronically Signed   By: Jolaine Click M.D.   On: 09/10/2015 12:01   Dg Chest 2 View  09/09/2015  CLINICAL DATA:  Hypotension, shortness of breath, swelling in the legs. EXAM: CHEST  2 VIEW COMPARISON:  09/04/2015 FINDINGS: Cardiac enlargement with mild vascular congestion. Mild interstitial edema. Appearance is similar to previous study. No blunting of costophrenic angles. No pneumothorax. Small amount of fluid in the fissures. Mediastinal contours appear intact. IMPRESSION: Cardiac enlargement with mild pulmonary vascular congestion and mild interstitial edema. Small amount of fluid in the  fissures. Electronically Signed   By: Burman Nieves M.D.   On: 09/09/2015 00:34      Principal Problem:   Acute on chronic diastolic CHF (congestive heart failure) (HCC) Active Problems:   Essential hypertension   A-fib (HCC)   Edema of right lower extremity   Noncompliance with medication regimen   Acute respiratory failure with hypoxia (HCC)   Assessment/Plan 1. Acute on chronic diastolic CHF with volume overload. Normal LVEF 05/2015. Secondary to noncompliance with medications. Question diet compliance.  2. Acute hypox resp failure secondary to acute CHF. 3. Elevated troponin. Suspect secondary to demand ischemia. No signs or symptoms to suggest ACS. 4. Hypokalemia  5. HTN, labile 6. Elevated t bili. Chronic. Suspect Gilbert's syndrome. 7. Atrial fibrillation. CHA2DS2-VASc 2. 8. Noncompliance with medical regimen 9. Morbid obesity   Admit to telemetry.  IV diuresis, diet education  Repeat troponin 1  Cardiology consult for further recommendations.  Replace potassium  Code Status: full code  DVT prophylaxis: Eliquis Family Communication:  Disposition Plan/Anticipated LOS: admit, 2 days  Time spent: 60 minutes  Brendia Sacks, MD  [facility] Direct contact:  --Via amion app OR  --[email or web address]; password [email or web address] and click  7PM-7AM contact night coverage as above  09/10/2015, 4:40 PM

## 2015-09-10 NOTE — ED Notes (Signed)
Attempted to call report again. Nurse to call back.

## 2015-09-10 NOTE — Progress Notes (Signed)
Patients HR going up as high as 140's, BP is 124/106, paged on call MD, will follow the new orders received and continue to monitor the patient.

## 2015-09-10 NOTE — ED Notes (Signed)
Sats 95-96% on room air.

## 2015-09-10 NOTE — ED Notes (Signed)
Ems reports pt c/o sob and legs swelling.  Reports was seen here a couple of days ago for same.  Room air 02 sat with EMS was 89%.  FD placed pt on 15liters 02 and sat was 99%.  Pt presently on 3 Liter via [place] and sat 98%.   Pt also reports pain in stomach and c/o distention.

## 2015-09-10 NOTE — ED Provider Notes (Signed)
CSN: [id number]     Arrival date & time 09/10/15  1014 History   First MD Initiated Contact with Patient 09/10/15 1034     Chief Complaint  Patient presents with  . Shortness of Breath  . Leg Swelling     (Consider location/radiation/quality/duration/timing/severity/associated sxs/prior Treatment) Patient is a 50 y.o. male presenting with shortness of breath. The history is provided by the patient. No language interpreter was used.  Shortness of Breath Severity:  Severe Onset quality:  Gradual Duration:  1 week Timing:  Constant Progression:  Worsening Chronicity:  New Context: activity   Context: not strong odors and not weather changes   Relieved by:  Diuretics Worsened by:  Nothing tried Ineffective treatments:  None tried Associated symptoms: no abdominal pain   Risk factors: no prolonged immobilization   Pt has a history of CHF and edema to his extremities.  Pt was seen here yesterday and given lasix.   Pt reports he had a lot of urine output however he is even more short of breath today.  Pt has a history of Afib.   Pt reports he thinks heart has been fast.  Past Medical History  Diagnosis Date  . Hypertensive heart disease     Hypertensive heart disease with prior episodes of CHF  . Hyperlipidemia   . Hypertension   . Gastroesophageal reflux disease   . Osteoarthritis   . Peptic ulcer disease   . Morbid obesity (HCC)     /sleep apnea  . Allergic rhinitis     plus h/o asthma  . Tobacco abuse, in remission     Cigarettes discontinued in 2010  . CHF (congestive heart failure) (HCC)   . A-fib (HCC)     2015  . Asthma    Past Surgical History  Procedure Laterality Date  . None    . Cardiac catheterization N/A 06/14/2015    Procedure: Right/Left Heart Cath and Coronary Angiography;  Surgeon: Runell Gess, MD;  Location: [facility] INVASIVE CV LAB;  Service: Cardiovascular;  Laterality: N/A;   Family History  Problem Relation Age of Onset  . Colon cancer Neg Hx    . Inflammatory bowel disease Neg Hx   . Liver disease Neg Hx    Social History  Substance Use Topics  . Smoking status: Former Smoker -- 0.50 packs/day for 20 years    Types: Cigarettes    Start date: 04/26/1988    Quit date: 06/23/2007  . Smokeless tobacco: Never Used     Comment: 1 ppd former smoker  . Alcohol Use: No     Comment: no etoh since 2009    Review of Systems  Respiratory: Positive for shortness of breath.   Gastrointestinal: Negative for abdominal pain.  All other systems reviewed and are negative.     Allergies  Bee venom; Aspirin; Banana; Diltiazem; Hydralazine; and Lipitor  Home Medications   Prior to Admission medications   Medication Sig Start Date End Date Taking? Authorizing Provider  apixaban (ELIQUIS) 5 MG TABS tablet Take 1 tablet (5 mg total) by mouth 2 (two) times daily. 06/17/15  Yes [name], MD  aspirin EC 81 MG EC tablet Take 1 tablet (81 mg total) by mouth daily. Patient taking differently: Take 81 mg by mouth daily as needed for pain.  06/17/15  Yes [name], MD  beclomethasone (QVAR) 80 MCG/ACT inhaler Inhale 2 puffs into the lungs daily.    Yes Historical Provider, MD  cloNIDine (CATAPRES) 0.1 MG tablet  Take 0.1 mg by mouth daily.    Yes Historical Provider, MD  furosemide (LASIX) 40 MG tablet Take 1 tablet (40 mg total) by mouth 2 (two) times daily. 09/04/15  Yes Antoine Poche, MD  metoprolol succinate (TOPROL-XL) 50 MG 24 hr tablet Take 1 tablet (50 mg total) by mouth daily. Take with or immediately following a meal. Patient taking differently: Take 50 mg by mouth every other day. Take with or immediately following a meal. 06/17/15  Yes [name], MD  potassium chloride SA (K-DUR,KLOR-CON) 20 MEQ tablet Take 1 tablet (20 mEq total) by mouth 2 (two) times daily. 09/04/15  Yes Devoria Albe, MD  atorvastatin (LIPITOR) 80 MG tablet Take 1 tablet (80 mg total) by mouth daily at 6 PM. Patient not taking: Reported on  07/08/2015 06/17/15   [name], MD  Calcium Carbonate Antacid (ALKA-SELTZER ANTACID PO) Take 1 tablet by mouth as needed (upset stomach).    Historical Provider, MD  diltiazem (CARDIZEM CD) 360 MG 24 hr capsule Take 1 capsule (360 mg total) by mouth daily. Patient not taking: Reported on 08/02/2015 07/08/15   [name], MD  losartan (COZAAR) 50 MG tablet Take 1 tablet (50 mg total) by mouth daily. Patient not taking: Reported on 07/08/2015 06/17/15   [name], MD  spironolactone (ALDACTONE) 25 MG tablet Take 1 tablet (25 mg total) by mouth daily. Patient not taking: Reported on 07/18/2015 06/17/15   [name], MD   BP 160/114 mmHg  Pulse 109  Temp(Src) 100.3 F (37.9 C) (Oral)  Resp 14  Ht 6' (1.829 m)  Wt 141.976 kg  BMI 42.44 kg/m2  SpO2 98% Physical Exam  Constitutional: He is oriented to person, place, and time. He appears well-developed and well-nourished.  HENT:  Head: Normocephalic and atraumatic.  Right Ear: External ear normal.  Left Ear: External ear normal.  Nose: Nose normal.  Mouth/Throat: Oropharynx is clear and moist.  Eyes: Conjunctivae and EOM are normal. Pupils are equal, round, and reactive to light.  Neck: Normal range of motion.  Cardiovascular: Normal rate and normal heart sounds.   Pulmonary/Chest: Effort normal. He has no wheezes. He exhibits no tenderness.  Abdominal: Soft. He exhibits no distension.  Musculoskeletal: He exhibits edema.  Neurological: He is alert and oriented to person, place, and time.  Skin: Skin is warm.  Psychiatric: He has a normal mood and affect.  Nursing note and vitals reviewed.   ED Course  Procedures (including critical care time) Labs Review Labs Reviewed  CBC - Abnormal; Notable for the following:    RBC 4.05 (*)    Hemoglobin 11.6 (*)    HCT 35.2 (*)    All other components within normal limits  COMPREHENSIVE METABOLIC PANEL - Abnormal; Notable for the following:    Potassium 3.3 (*)     Calcium 8.6 (*)    Total Protein 6.1 (*)    Total Bilirubin 2.2 (*)    All other components within normal limits  BRAIN NATRIURETIC PEPTIDE - Abnormal; Notable for the following:    B Natriuretic Peptide 887.0 (*)    All other components within normal limits  TROPONIN I - Abnormal; Notable for the following:    Troponin I 0.05 (*)    All other components within normal limits    Imaging Review Dg Chest 2 View  09/10/2015  CLINICAL DATA:  Short of breath.  Lower extremity swelling. EXAM: CHEST  2 VIEW COMPARISON:  09/08/2015 FINDINGS:  Cardiomegaly. Moderate pulmonary edema. No pneumothorax. No pleural effusion. IMPRESSION: Worsening CHF. Electronically Signed   By: Jolaine Click M.D.   On: 09/10/2015 12:01   Dg Chest 2 View  09/09/2015  CLINICAL DATA:  Hypotension, shortness of breath, swelling in the legs. EXAM: CHEST  2 VIEW COMPARISON:  09/04/2015 FINDINGS: Cardiac enlargement with mild vascular congestion. Mild interstitial edema. Appearance is similar to previous study. No blunting of costophrenic angles. No pneumothorax. Small amount of fluid in the fissures. Mediastinal contours appear intact. IMPRESSION: Cardiac enlargement with mild pulmonary vascular congestion and mild interstitial edema. Small amount of fluid in the fissures. Electronically Signed   By: Burman Nieves M.D.   On: 09/09/2015 00:34   I have personally reviewed and evaluated these images and lab results as part of my medical decision-making.   EKG Interpretation   Date/Time:  Tuesday September 10 2015 10:34:00 EDT Ventricular Rate:  111 PR Interval:    QRS Duration: 89 QT Interval:  345 QTC Calculation: 469 R Axis:   68 Text Interpretation:  Atrial fibrillation Ventricular premature complex  Borderline repolarization abnormality Atrial fibrillation T wave  abnormality Abnormal ekg Confirmed by Gerhard Munch  MD ([id number]) on  09/10/2015 10:54:22 AM      MDM Pt has increased BNP, Chest xray worse than  yesterday.     Final diagnoses:  Acute on chronic congestive heart failure, unspecified congestive heart failure type (HCC)  Chronic atrial fibrillation (HCC)    I spoke to the Hospitalist who will admit.     Lonia Skinner [place], PA-C 09/10/15 1522  Gerhard Munch, MD 09/10/15 [phone]

## 2015-09-11 ENCOUNTER — Ambulatory Visit: Payer: Medicare Other | Admitting: Physician Assistant

## 2015-09-11 DIAGNOSIS — I482 Chronic atrial fibrillation: Secondary | ICD-10-CM

## 2015-09-11 DIAGNOSIS — I5033 Acute on chronic diastolic (congestive) heart failure: Secondary | ICD-10-CM

## 2015-09-11 LAB — BASIC METABOLIC PANEL
Anion gap: 9 (ref 5–15)
BUN: 21 mg/dL — ABNORMAL HIGH (ref 6–20)
CALCIUM: 8.4 mg/dL — AB (ref 8.9–10.3)
CO2: 25 mmol/L (ref 22–32)
Chloride: 109 mmol/L (ref 101–111)
Creatinine, Ser: 1.21 mg/dL (ref 0.61–1.24)
GFR calc non Af Amer: >60 mL/min (ref >60)
Glucose, Bld: 91 mg/dL (ref 65–99)
Potassium: 3.5 mmol/L (ref 3.5–5.1)
Sodium: 143 mmol/L (ref 135–145)

## 2015-09-11 MED ORDER — CLONIDINE HCL 0.1 MG PO TABS
0.1000 mg | ORAL_TABLET | Freq: Four times a day (QID) | ORAL | Status: DC | PRN
  Administered 2015-09-11: 0.1 mg via ORAL
  Filled 2015-09-11: qty 1

## 2015-09-11 NOTE — Progress Notes (Signed)
Patients BP this morning is 149/102, HR going up as high as 140's, paged on call MD to make them aware, will follow any orders given and continue to monitor the patient.

## 2015-09-11 NOTE — Plan of Care (Signed)
Problem: Food- and Nutrition-Related Knowledge Deficit (NB-1.1) Goal: Nutrition education Formal process to instruct or train a patient/client in a skill or to impart knowledge to help patients/clients voluntarily manage or modify food choices and eating behavior to maintain or improve health. Outcome: Adequate for Discharge   Note: Pt's diet is extremely high in sodium. Unsure if patient is just noncompliant or he generally was unaware of his sodium intake.   Nutrition Education Note  RD consulted for nutrition education regardingt CHF.  RD provided "Heart Failure Nutrition Therapy" handout from the Academy of Nutrition and Dietetics as well as a simple sheet listing the foods that are highest in sodium. Reviewed patient's dietary recall.  Breakfast: Sausage/Bacon, Eggs, Toast Lunch: Burgers, french fries, fast food ([employer], Mindi Slicker) Dinner: "Simple meats", Vegetables/fruits with Ranch Beverages: Tea, juice  Dietary Recall positive for: Hotdogs, frozen meals, deli meats   RD educated that pt is consuming an extremely high sodium breakfast. He was in the mindset that [place] products were low in sodium, when in reality they often times have even more sodium then the pork brands. Discouraged intake of breakfast meats. Reccomende items such as eggs, oatmeal, toast or even grits, pancakes, waffles in moderation. Any of these would be lower in salt then what he is eating.   For lunch, patient is mainly eating high fat foods. Discouraged this due to the extremely high amounts of sodium in fast food. Encouraged to make own sandwiches with fresh meats. Also educated that his hotdogs, which he states he eats "all the time", are high in salt. Advised that all processed meats and deli meats are very far from ideal for CHF patients.   Finally, discouraged frozen meals, which he also reports eating frequently. This is one of the saltiest foods he can consume.   Discouraged use of salt shaker  which he states he doesn't use. Encouraged fresh fruits and vegetables. He mostly purchases canned; advised to rinse these off. He states he boils his vegetables which is also acceptable.   RD discussed why it is important for patient to adhere to diet recommendations and emphasized the importance of weighing self daily. Went over scenarios to show how his weight can be used as a pseudo "report card" of how well he is adhering to diet.   Expect fair compliance.  Body mass index is 41.37 kg/(m^2). Pt meets criteria for Morbidly obese based on current BMI.  Current diet order is Heart Healthy, patient is consuming approximately 50% of meals at this time. Labs and medications reviewed. No further nutrition interventions warranted at this time. Contact information provided to outpatient education if desired. If additional nutrition issues arise, please re-consult RD.   Christophe Louis RD, LDN Nutrition Pager: [phone] 09/11/2015 10:46 AM

## 2015-09-11 NOTE — Progress Notes (Signed)
[facility] PROGRESS NOTE  [name]:[id number] DOB: 21-Apr-1966 DOA: 09/10/2015 PCP: [name]  Assessment/Plan: Acute on chronic diastolic CHF -Is 3 L negative since admission overnight. -Continue Lasix at current dose of 40 mg IV twice a day. -Has been seen by cardiology. Echo in December 2016 with normal ejection fraction of 55% with severe concentric hypertrophy.  Elevated troponin -Flat, likely related to CHF exacerbation and noncompliance. -No plans for further cardiac intervention. Cardiology following.  Atrial fibrillation -Rate controlled, continue Eliquis for anticoagulation.  Hypertension -Continue current medications. -Improving with medication treatment, has history of medication noncompliance.  Morbid obesity -Noted, discussed low carb diet and exercise.  Code Status: Full code Family Communication: Patient only  Disposition Plan: To be determined, anticipate discharge in 48 hours   Consultants:  Cardiology   Antibiotics:  None   Subjective: Feels tired, no other complaints specifically no chest pain or shortness of breath  Objective: Filed Vitals:   09/11/15 0447 09/11/15 0753 09/11/15 0848 09/11/15 1300  BP: 149/102  141/100 121/98  Pulse: 85  77 88  Temp: 98.1 F (36.7 C)   97.5 F (36.4 C)  TempSrc: Oral   Oral  Resp: 20   21  Height: 6' (1.829 m)     Weight: 138.392 kg (305 lb 1.6 oz)     SpO2: 100% 92%  97%    Intake/Output Summary (Last 24 hours) at 09/11/15 1559 Last data filed at 09/11/15 1300  Gross per 24 hour  Intake   1203 ml  Output   3100 ml  Net  -1897 ml   Filed Weights   09/10/15 1018 09/10/15 1825 09/11/15 0447  Weight: 141.976 kg (313 lb) 141.976 kg (313 lb) 138.392 kg (305 lb 1.6 oz)    Exam:   General:  Alert, awake, oriented 3  Cardiovascular: Irregular rhythm, no murmurs, rubs or gallops  Respiratory: Mild bibasilar crackles  Abdomen: Obese, soft, nontender,  nondistended, positive bowel sounds  Extremities: 3+ pitting edema bilaterally   Neurologic:  Grossly intact and nonfocal  Data Reviewed: Basic Metabolic Panel:  Recent Labs Lab 09/08/15 2341 09/10/15 1118 09/11/15 0529  NA 142 143 143  K 3.2* 3.3* 3.5  CL 110 109 109  CO2 GLUCOSE 104* 94 91  BUN 17 18 21*  CREATININE 1.07 1.08 1.21  CALCIUM 8.3* 8.6* 8.4*  MG 2.0  --   --    Liver Function Tests:  Recent Labs Lab 09/10/15 1118  AST 25  ALT 21  ALKPHOS 65  BILITOT 2.2*  PROT 6.1*  ALBUMIN 3.5   No results for input(s): LIPASE, AMYLASE in the last 168 hours. No results for input(s): AMMONIA in the last 168 hours. CBC:  Recent Labs Lab 09/08/15 2341 09/10/15 1118  WBC 4.4 5.4  NEUTROABS 3.0  --   HGB 11.9* 11.6*  HCT 36.0* 35.2*  MCV 87.0 86.9  PLT 213 225   Cardiac Enzymes:  Recent Labs Lab 09/08/15 2341 09/10/15 1118 09/10/15 1831  TROPONINI 0.04* 0.05* 0.04*   BNP (last 3 results)  Recent Labs  09/04/15 0030 09/08/15 2341 09/10/15 1118  BNP 313.0* 750.0* 887.0*    ProBNP (last 3 results) No results for input(s): PROBNP in the last 8760 hours.  CBG: No results for input(s): GLUCAP in the last 168 hours.  No results found for this or any previous visit (from the past 240 hour(s)).   Studies: Dg Chest 2 View  09/10/2015  CLINICAL DATA:  Short of breath.  Lower extremity swelling. EXAM: CHEST  2 VIEW COMPARISON:  09/08/2015 FINDINGS: Cardiomegaly. Moderate pulmonary edema. No pneumothorax. No pleural effusion. IMPRESSION: Worsening CHF. Electronically Signed   By: Jolaine Click M.D.   On: 09/10/2015 12:01    Scheduled Meds: . apixaban  5 mg Oral BID  . aspirin EC  81 mg Oral Daily  . budesonide (PULMICORT) nebulizer solution  0.25 mg Nebulization BID  . cloNIDine  0.1 mg Oral Daily  . furosemide  40 mg Intravenous Q12H  . metoprolol succinate  50 mg Oral Daily  . potassium chloride SA  20 mEq Oral BID  . sodium chloride  flush  3 mL Intravenous Q12H   Continuous Infusions:   Principal Problem:   Acute on chronic diastolic CHF (congestive heart failure) (HCC) Active Problems:   Essential hypertension   A-fib (HCC)   Edema of right lower extremity   Noncompliance with medication regimen   Acute respiratory failure with hypoxia (HCC)   Chronic atrial fibrillation (HCC)    Time spent: 25 minutes. Greater than 50% of this time was spent in direct contact with the patient coordinating care.    Chaya Jan  [facility] Pager [phone]  If 7PM-7AM, please contact night-coverage at [email or web address], password [facility] 09/11/2015, 3:59 PM  LOS: 1 day

## 2015-09-11 NOTE — Consult Note (Signed)
Reason for Consult:CHF Referring Physician: [facility] Cardiologist: Dr. Bronson Ing Consulting Cardiologist: Dr. Orlean Patten [name] is an 50 y.o. male.  HPI: This is a 50 y.o. Male patient of Dr. Bronson Ing with history of severe diastolic CHF, malignant HTN, Atrial fib CHADSVASC=2, noncompliance with eliquis and all meds, morbid obesity. He just saw Dr. Harl Bowie in the office 09/04/15 after ER visit for CHF. Torsemide was changed to Lasix on patients request.  He comes in again with recurrent CHF and atrial fib with RVR.  Past Medical History  Diagnosis Date  . Hypertensive heart disease     Hypertensive heart disease with prior episodes of CHF  . Hyperlipidemia   . Hypertension   . Gastroesophageal reflux disease   . Osteoarthritis   . Peptic ulcer disease   . Morbid obesity ([place])     /sleep apnea  . Allergic rhinitis     plus h/o asthma  . Tobacco abuse, in remission     Cigarettes discontinued in 2010  . CHF (congestive heart failure) ([place])   . A-fib ([place])     2015  . Asthma     Past Surgical History  Procedure Laterality Date  . None    . Cardiac catheterization N/A 06/14/2015    Procedure: Right/Left Heart Cath and Coronary Angiography;  Surgeon: Lorretta Harp, MD;  Location: [place] [facility];  Service: Cardiovascular;  Laterality: N/A;    Family History  Problem Relation Age of Onset  . Colon cancer Neg Hx   . Inflammatory bowel disease Neg Hx   . Liver disease Neg Hx   . Stroke Father   . Heart attack Father     Social History:  reports that he quit smoking about 8 years ago. His smoking use included Cigarettes. He started smoking about 27 years ago. He has a 10 pack-year smoking history. He has never used smokeless tobacco. He reports that he does not drink alcohol or use illicit drugs.  Allergies:  Allergies  Allergen Reactions  . Bee Venom Shortness Of Breath and Swelling  . Aspirin Other (See Comments)    Chewable 81 mg tablets upsets patients stomach   . Banana Diarrhea  . Diltiazem Other (See Comments)    Makes heart beat fast  . Hydralazine Other (See Comments)    Patient states it causes Tachycardia  . Lipitor [Atorvastatin] Other (See Comments)    Nose bleed    Medications: I have reviewed the patient's current medications.  Results for orders placed or performed during the hospital encounter of 09/10/15 (from the past 48 hour(s))  CBC     Status: Abnormal   Collection Time: 09/10/15 11:18 AM  Result Value Ref Range   WBC 5.4 4.0 - 10.5 K/uL   RBC 4.05 (L) 4.22 - 5.81 MIL/uL   Hemoglobin 11.6 (L) 13.0 - 17.0 g/dL   HCT 35.2 (L) 39.0 - 52.0 %   MCV 86.9 78.0 - 100.0 fL   MCH 28.6 26.0 - 34.0 pg   MCHC 33.0 30.0 - 36.0 g/dL   RDW 14.8 11.5 - 15.5 %   Platelets 225 150 - 400 K/uL  Comprehensive metabolic panel     Status: Abnormal   Collection Time: 09/10/15 11:18 AM  Result Value Ref Range   Sodium 143 135 - 145 mmol/L   Potassium 3.3 (L) 3.5 - 5.1 mmol/L   Chloride 109 101 - 111 mmol/L   CO2 24 22 - 32 mmol/L   Glucose, Bld 94 65 -  99 mg/dL   BUN 18 6 - 20 mg/dL   Creatinine, Ser 1.08 0.61 - 1.24 mg/dL   Calcium 8.6 (L) 8.9 - 10.3 mg/dL   Total Protein 6.1 (L) 6.5 - 8.1 g/dL   Albumin 3.5 3.5 - 5.0 g/dL   AST 25 15 - 41 U/L   ALT 21 17 - 63 U/L   Alkaline Phosphatase 65 38 - 126 U/L   Total Bilirubin 2.2 (H) 0.3 - 1.2 mg/dL   GFR calc non Af Amer >60 >60 mL/min   GFR calc Af Amer >60 >60 mL/min    Comment: (NOTE) The eGFR has been calculated using the CKD EPI equation. This calculation has not been validated in all clinical situations. eGFR's persistently <60 mL/min signify possible Chronic Kidney Disease.    Anion gap 10 5 - 15  Brain natriuretic peptide (order if patient c/o SOB ONLY)     Status: Abnormal   Collection Time: 09/10/15 11:18 AM  Result Value Ref Range   B Natriuretic Peptide 887.0 (H) 0.0 - 100.0 pg/mL  Troponin I     Status: Abnormal   Collection Time: 09/10/15 11:18 AM  Result Value Ref  Range   Troponin I 0.05 (H) <0.031 ng/mL    Comment:        PERSISTENTLY INCREASED TROPONIN VALUES IN THE RANGE OF 0.04-0.49 ng/mL CAN BE SEEN IN:       -UNSTABLE ANGINA       -CONGESTIVE HEART FAILURE       -MYOCARDITIS       -CHEST TRAUMA       -ARRYHTHMIAS       -LATE PRESENTING MYOCARDIAL INFARCTION       -COPD   CLINICAL FOLLOW-UP RECOMMENDED.   Troponin I     Status: Abnormal   Collection Time: 09/10/15  6:31 PM  Result Value Ref Range   Troponin I 0.04 (H) <0.031 ng/mL    Comment:        PERSISTENTLY INCREASED TROPONIN VALUES IN THE RANGE OF 0.04-0.49 ng/mL CAN BE SEEN IN:       -UNSTABLE ANGINA       -CONGESTIVE HEART FAILURE       -MYOCARDITIS       -CHEST TRAUMA       -ARRYHTHMIAS       -LATE PRESENTING MYOCARDIAL INFARCTION       -COPD   CLINICAL FOLLOW-UP RECOMMENDED.   Basic metabolic panel     Status: Abnormal   Collection Time: 09/11/15  5:29 AM  Result Value Ref Range   Sodium 143 135 - 145 mmol/L   Potassium 3.5 3.5 - 5.1 mmol/L   Chloride 109 101 - 111 mmol/L   CO2 25 22 - 32 mmol/L   Glucose, Bld 91 65 - 99 mg/dL   BUN 21 (H) 6 - 20 mg/dL   Creatinine, Ser 1.21 0.61 - 1.24 mg/dL   Calcium 8.4 (L) 8.9 - 10.3 mg/dL   GFR calc non Af Amer >60 >60 mL/min   GFR calc Af Amer >60 >60 mL/min    Comment: (NOTE) The eGFR has been calculated using the CKD EPI equation. This calculation has not been validated in all clinical situations. eGFR's persistently <60 mL/min signify possible Chronic Kidney Disease.    Anion gap 9 5 - 15    Dg Chest 2 View  09/10/2015  CLINICAL DATA:  Short of breath.  Lower extremity swelling. EXAM: CHEST  2 VIEW COMPARISON:  09/08/2015 FINDINGS: Cardiomegaly. Moderate  pulmonary edema. No pneumothorax. No pleural effusion. IMPRESSION: Worsening CHF. Electronically Signed   By: Marybelle Killings M.D.   On: 09/10/2015 12:01    ROS  See HPI Eyes: Negative Ears:Negative for hearing loss, tinnitus Cardiovascular: Negative for chest  pain, palpitations,irregular heartbeat, dyspnea, dyspnea on exertion, near-syncope, orthopnea, paroxysmal nocturnal dyspnea and syncope,edema, claudication, cyanosis,.  Respiratory:   Negative for cough, hemoptysis, shortness of breath, sleep disturbances due to breathing, sputum production and wheezing.   Endocrine: Negative for cold intolerance and heat intolerance.  Hematologic/Lymphatic: Negative for adenopathy and bleeding problem. Does not bruise/bleed easily.  Musculoskeletal: Negative.   Gastrointestinal: Negative for nausea, vomiting, reflux, abdominal pain, diarrhea, constipation.   Genitourinary: Negative for bladder incontinence, dysuria, flank pain, frequency, hematuria, hesitancy, nocturia and urgency.  Neurological: Negative.  Allergic/Immunologic: Negative for environmental allergies.  Blood pressure 149/102, pulse 85, temperature 98.1 F (36.7 C), temperature source Oral, resp. rate 20, height 6' (1.829 m), weight 305 lb 1.6 oz (138.392 kg), SpO2 92 %. Physical Exam Affect appropriate Chronically ill black male  HEENT: normal Neck supple with no adenopathy JVP normal no bruits no thyromegaly Lungs clear with no wheezing and good diaphragmatic motion Heart:  S1/S2 no murmur, no rub, gallop or click PMI normal Abdomen: benighn, BS positve, no tenderness, no AAA no bruit.  Positive HJR Plus 2 LE edema  Distal pulses intact with no bruits No edema Neuro non-focal Skin warm and dry No muscular weakness   Assessment/Plan: Acute on chronic diastolic CHF secondary to noncompliance:  Continue iv lasix.  No need to repeat echo.  Daily weight I/O's    Atrial fib with RVR CHADSVASC=2, noncompliance with Eliquis  Start Xarelto once/day dosing may be easier.  Continue Toprol for rate control Discussed risk of CVA with patient given his non compliance    This patients CHA2DS2-VASc Score and unadjusted Ischemic Stroke Rate (% per year) is equal to 2.2 % stroke rate/year from a  score of 2  Above score calculated as 1 point each if present [CHF, HTN, DM, Vascular=MI/PAD/Aortic Plaque, Age if 65-74, or Male] Above score calculated as 2 points each if present [Age > 75, or Stroke/TIA/TE]   Malignant HTN  Due to non compliance should be easy to control with diuresis and med administration in hospital Would not use clonidine due to  Rebound effects in chronically non compliant patient   Morbid Obesity  Discussed low carb diet and exercise.  Patient does not appear to care much for health   Jenkins Rouge

## 2015-09-12 DIAGNOSIS — I480 Paroxysmal atrial fibrillation: Secondary | ICD-10-CM

## 2015-09-12 LAB — BASIC METABOLIC PANEL
ANION GAP: 9 (ref 5–15)
BUN: 23 mg/dL — ABNORMAL HIGH (ref 6–20)
CALCIUM: 8.3 mg/dL — AB (ref 8.9–10.3)
CO2: 25 mmol/L (ref 22–32)
Chloride: 109 mmol/L (ref 101–111)
Creatinine, Ser: 1.18 mg/dL (ref 0.61–1.24)
GFR calc Af Amer: >60 mL/min (ref >60)
GLUCOSE: 92 mg/dL (ref 65–99)
Potassium: 3.2 mmol/L — ABNORMAL LOW (ref 3.5–5.1)
Sodium: 143 mmol/L (ref 135–145)

## 2015-09-12 MED ORDER — RIVAROXABAN 20 MG PO TABS
20.0000 mg | ORAL_TABLET | Freq: Every day | ORAL | Status: DC
Start: 2015-09-12 — End: 2015-09-15
  Administered 2015-09-12 – 2015-09-14 (×2): 20 mg via ORAL
  Filled 2015-09-12 (×3): qty 1

## 2015-09-12 MED ORDER — LOSARTAN POTASSIUM 50 MG PO TABS
100.0000 mg | ORAL_TABLET | Freq: Every day | ORAL | Status: DC
  Administered 2015-09-12 – 2015-09-15 (×4): 100 mg via ORAL
  Filled 2015-09-12 (×4): qty 2

## 2015-09-12 MED ORDER — RIVAROXABAN 20 MG PO TABS: 20.0000 mg | ORAL_TABLET | Freq: Every day | ORAL | Status: DC

## 2015-09-12 MED ORDER — POTASSIUM CHLORIDE CRYS ER 20 MEQ PO TBCR
40.0000 meq | EXTENDED_RELEASE_TABLET | Freq: Once | ORAL | Status: DC
  Filled 2015-09-12: qty 2

## 2015-09-12 NOTE — Care Management Note (Signed)
Case Management Note  Patient Details  Name: [name] MRN: [id number] Date of Birth: [birth year]  Subjective/Objective:                  Pt admitted with CHF exacerbation. Pt is from home, lives with mother and children. Pt is ind with ADL's. Pt has PCP, transportation and no difficulty obtaining medications. Pt has no HH services or DME PTA.   Action/Plan: Plan for return home with self care at DC. No CM needs anticipated.   Expected Discharge Date:  09/13/15               Expected Discharge Plan:  Home/Self Care  In-House Referral:  NA  Discharge planning Services  CM Consult  Post Acute Care Choice:  NA Choice offered to:  NA  DME Arranged:    DME Agency:     HH Arranged:    HH Agency:     Status of Service:  Completed, signed off  Medicare Important Message Given:    Date Medicare IM Given:    Medicare IM give by:    Date Additional Medicare IM Given:    Additional Medicare Important Message give by:     If discussed at Long Length of Stay Meetings, dates discussed:    Additional Comments:  [name], RN 09/12/2015, 4:02 PM

## 2015-09-12 NOTE — Progress Notes (Signed)
ANTICOAGULATION CONSULT NOTE - Initial Consult  Pharmacy Consult for xarelto Indication: atrial fibrillation  Allergies  Allergen Reactions  . Bee Venom Shortness Of Breath and Swelling  . Aspirin Other (See Comments)    Chewable 81 mg tablets upsets patients stomach  . Banana Diarrhea  . Diltiazem Other (See Comments)    Makes heart beat fast  . Hydralazine Other (See Comments)    Patient states it causes Tachycardia  . Lipitor [Atorvastatin] Other (See Comments)    Nose bleed    Patient Measurements: Height: 6' (182.9 cm) Weight: (!) 302 lb 7.5 oz (137.2 kg) IBW/kg (Calculated) : 77.6   Vital Signs: Temp: 98.3 F (36.8 C) (03/23 0546) Temp Source: Oral (03/23 0546) BP: 152/107 mmHg (03/23 0606) Pulse Rate: 110 (03/23 0546)  Labs:  Recent Labs  09/10/15 1118 09/10/15 1831 09/11/15 0529 09/12/15 0535  HGB 11.6*  --   --   --   HCT 35.2*  --   --   --   PLT 225  --   --   --   CREATININE 1.08  --  1.21 1.18  TROPONINI 0.05* 0.04*  --   --     Estimated Creatinine Clearance: 111 mL/min (by C-G formula based on Cr of 1.18).   Medical History: Past Medical History  Diagnosis Date  . Hypertensive heart disease     Hypertensive heart disease with prior episodes of CHF  . Hyperlipidemia   . Hypertension   . Gastroesophageal reflux disease   . Osteoarthritis   . Peptic ulcer disease   . Morbid obesity (HCC)     /sleep apnea  . Allergic rhinitis     plus h/o asthma  . Tobacco abuse, in remission     Cigarettes discontinued in 2010  . CHF (congestive heart failure) (HCC)   . A-fib (HCC)     2015  . Asthma     Medications:  Prescriptions prior to admission  Medication Sig Dispense Refill Last Dose  . apixaban (ELIQUIS) 5 MG TABS tablet Take 1 tablet (5 mg total) by mouth 2 (two) times daily. 60 tablet 0 Past Week at Unknown time  . aspirin EC 81 MG EC tablet Take 1 tablet (81 mg total) by mouth daily. (Patient taking differently: Take 81 mg by mouth  daily as needed for pain. )   Past Month at Unknown time  . beclomethasone (QVAR) 80 MCG/ACT inhaler Inhale 2 puffs into the lungs daily.    Past Week at Unknown time  . cloNIDine (CATAPRES) 0.1 MG tablet Take 0.1 mg by mouth daily.    09/09/2015 at Unknown time  . furosemide (LASIX) 40 MG tablet Take 1 tablet (40 mg total) by mouth 2 (two) times daily. 60 tablet 3 09/09/2015 at Unknown time  . metoprolol succinate (TOPROL-XL) 50 MG 24 hr tablet Take 1 tablet (50 mg total) by mouth daily. Take with or immediately following a meal. (Patient taking differently: Take 50 mg by mouth every other day. Take with or immediately following a meal.) 30 tablet 0 09/09/2015 at Unknown time  . potassium chloride SA (K-DUR,KLOR-CON) 20 MEQ tablet Take 1 tablet (20 mEq total) by mouth 2 (two) times daily. 8 tablet 0 09/09/2015 at Unknown time  . atorvastatin (LIPITOR) 80 MG tablet Take 1 tablet (80 mg total) by mouth daily at 6 PM. (Patient not taking: Reported on 07/08/2015) 30 tablet 0 Not Taking  . Calcium Carbonate Antacid (ALKA-SELTZER ANTACID PO) Take 1 tablet by mouth as  needed (upset stomach).   unknown  . diltiazem (CARDIZEM CD) 360 MG 24 hr capsule Take 1 capsule (360 mg total) by mouth daily. (Patient not taking: Reported on 08/02/2015) 30 capsule 6 Not Taking  . losartan (COZAAR) 50 MG tablet Take 1 tablet (50 mg total) by mouth daily. (Patient not taking: Reported on 07/08/2015) 30 tablet 0 unknown  . spironolactone (ALDACTONE) 25 MG tablet Take 1 tablet (25 mg total) by mouth daily. (Patient not taking: Reported on 07/18/2015) 30 tablet 0 never    Assessment: 50 yo man to switch from eliquis to xarelto in hopes of improving compliance.  Last dose eliquis was last night Goal of Therapy:  Appropriate anticoagulation Monitor platelets by anticoagulation protocol: Yes   Plan:  Xarelto 20 mg po daily Monitor for bleeding complications  Abdullahi Vallone Poteet 09/12/2015,8:47 AM

## 2015-09-12 NOTE — Progress Notes (Signed)
Patient ID: [name], male   DOB: 04-07-1966, 50 y.o.   MRN: [id number]    Subjective:  Still with some dyspnea Getting nebulizer this am   Objective:  Filed Vitals:   09/11/15 2343 09/12/15 0546 09/12/15 0606 09/12/15 0806  BP: 155/95 151/120 152/107   Pulse: 87 110    Temp: 99 F (37.2 C) 98.3 F (36.8 C)    TempSrc: Oral Oral    Resp: 17 15    Height:      Weight:  137.2 kg (302 lb 7.5 oz)    SpO2: 94% 94%  91%    Intake/Output from previous day:  Intake/Output Summary (Last 24 hours) at 09/12/15 9458 Last data filed at 09/12/15 0547  Gross per 24 hour  Intake   1443 ml  Output   2600 ml  Net  -1157 ml    Physical Exam: Affect appropriate Chronically ill obese black male  HEENT: normal Neck supple with no adenopathy JVP normal no bruits no thyromegaly Lungs basilar crackles  no wheezing and good diaphragmatic motion Heart:  S1/S2 no murmur, no rub, gallop or click PMI normal Abdomen: benighn, BS positve, no tenderness, no AAA no bruit.  No HSM or HJR Distal pulses intact with no bruits Plus 2 bilateral LE edema Neuro non-focal Skin warm and dry No muscular weakness   Lab Results: Basic Metabolic Panel:  Recent Labs  59/29/24 0529 09/12/15 0535  NA 143 143  K 3.5 3.2*  CL 109 109  CO2 25 25  GLUCOSE 91 92  BUN 21* 23*  CREATININE 1.21 1.18  CALCIUM 8.4* 8.3*   Liver Function Tests:  Recent Labs  09/10/15 1118  AST 25  ALT 21  ALKPHOS 65  BILITOT 2.2*  PROT 6.1*  ALBUMIN 3.5   CBC:  Recent Labs  09/10/15 1118  WBC 5.4  HGB 11.6*  HCT 35.2*  MCV 86.9  PLT 225   Cardiac Enzymes:  Recent Labs  09/10/15 1118 09/10/15 1831  TROPONINI 0.05* 0.04*    Imaging: Dg Chest 2 View  09/10/2015  CLINICAL DATA:  Short of breath.  Lower extremity swelling. EXAM: CHEST  2 VIEW COMPARISON:  09/08/2015 FINDINGS: Cardiomegaly. Moderate pulmonary edema. No pneumothorax. No pleural effusion. IMPRESSION: Worsening CHF. Electronically  Signed   By: Jolaine Click M.D.   On: 09/10/2015 12:01    Cardiac Studies:  ECG:  afib rate 98 PVC    Telemetry:  afbi rates 90-100   Echo: Reviewed 05/28/15  Study Conclusions  - Left ventricle: The cavity size was normal. There was severe  concentric hypertrophy. Systolic function was normal. The  estimated ejection fraction was approximately 55%. Wall motion  was normal; there were no regional wall motion abnormalities. The  study was not technically sufficient to allow evaluation of LV  diastolic dysfunction due to atrial fibrillation. - Aortic valve: Mildly calcified annulus. Trileaflet. - Mitral valve: Mild to moderately thickened leaflets . There was  mild regurgitation. - Left atrium: The atrium was severely dilated. - Right ventricle: The cavity size was mildly dilated. Systolic  function was mildly reduced. - Right atrium: The atrium was moderately dilated. - Tricuspid valve: There was mild regurgitation. - Pulmonary arteries: Systolic pressure was moderately to severely  increased. PA peak pressure: 59 mm Hg (S). - Inferior vena cava: The vessel was dilated. The respirophasic  diameter changes were in the normal range (>= 50%). Estimated CVP  8 mmHg.  Medications:   . aspirin EC  81 mg Oral Daily  . budesonide (PULMICORT) nebulizer solution  0.25 mg Nebulization BID  . cloNIDine  0.1 mg Oral Daily  . furosemide  40 mg Intravenous Q12H  . losartan  100 mg Oral Daily  . metoprolol succinate  50 mg Oral Daily  . potassium chloride SA  20 mEq Oral BID  . potassium chloride  40 mEq Oral Once  . sodium chloride flush  3 mL Intravenous Q12H       Assessment/Plan:  Acute on chronic diastolic CHF secondary to noncompliance: Continue iv lasix. Supplement K No need to repeat echo. Daily weight I/O's   Atrial fib with RVR CHADSVASC=2, noncompliance with Eliquis Start Xarelto once/day dosing may be easier. Continue Toprol for rate control Discussed risk  of CVA with patient given his non compliance   This patients CHA2DS2-VASc Score and unadjusted Ischemic Stroke Rate (% per year) is equal to 2.2 % stroke rate/year from a score of 2  Above score calculated as 1 point each if present [CHF, HTN, DM, Vascular=MI/PAD/Aortic Plaque, Age if 65-74, or Male] Above score calculated as 2 points each if present [Age > 75, or Stroke/TIA/TE]   Malignant HTN Due to non compliance should be easy to control with diuresis and med administration in hospital Would not use clonidine due to  Rebound effects in chronically non compliant patient  ARB started   Morbid Obesity Discussed low carb diet and exercise. Patient does not appear to care much for health   Charlton Haws 09/12/2015, 8:28 AM

## 2015-09-12 NOTE — Progress Notes (Signed)
[facility] PROGRESS NOTE  [name] ZOX:[id number] DOB: [birth year] DOA: 09/10/2015 PCP: [name]  Assessment/Plan: Acute on chronic diastolic CHF -Is 4.4 L negative since admission overnight. -Continue Lasix at current dose of 40 mg IV twice a day. -Has been seen by cardiology. Echo in December 2016 with normal ejection fraction of 55% with severe concentric hypertrophy. -Refuses to take aspirin.  Elevated troponin -Flat, likely related to CHF exacerbation and noncompliance. -No plans for further cardiac intervention. Cardiology following.  Atrial fibrillation -Rate controlled, continue Eliquis for anticoagulation.  Hypertension -Continue current medications. -Blood pressure remains elevated. Consider increase of metoprolol in a.m. if still elevated.  Morbid obesity -Noted, discussed low carb diet and exercise.  Code Status: Full code Family Communication: Patient only  Disposition Plan: To be determined, anticipate discharge in 48-72 hours   Consultants:  Cardiology   Antibiotics:  None   Subjective: Feels tired, no other complaints specifically no chest pain or shortness of breath. Is adamant that he does not want to take aspirin.  Objective: Filed Vitals:   09/12/15 0606 09/12/15 0806 09/12/15 1102 09/12/15 1422  BP: 152/107  123/104 150/127  Pulse:   101 119  Temp:    98.6 F (37 C)  TempSrc:    Oral  Resp:    16  Height:      Weight:      SpO2:  91%  94%    Intake/Output Summary (Last 24 hours) at 09/12/15 1735 Last data filed at 09/12/15 1707  Gross per 24 hour  Intake    243 ml  Output   1650 ml  Net  -1407 ml   Filed Weights   09/10/15 1825 09/11/15 0447 09/12/15 0546  Weight: 141.976 kg (313 lb) 138.392 kg (305 lb 1.6 oz) 137.2 kg (302 lb 7.5 oz)    Exam:   General:  Alert, awake, oriented 3  Cardiovascular: Irregular rhythm, no murmurs, rubs or gallops  Respiratory: Mild bibasilar  crackles  Abdomen: Obese, soft, nontender, nondistended, positive bowel sounds  Extremities: 2+ pitting edema bilaterally   Neurologic:  Grossly intact and nonfocal  Data Reviewed: Basic Metabolic Panel:  Recent Labs Lab 09/08/15 2341 09/10/15 1118 09/11/15 0529 09/12/15 0535  NA 142 143 143 143  K 3.2* 3.3* 3.5 3.2*  CL 110 109 109 109  CO2 GLUCOSE 104* 94 91 92  BUN 17 18 21* 23*  CREATININE 1.07 1.08 1.21 1.18  CALCIUM 8.3* 8.6* 8.4* 8.3*  MG 2.0  --   --   --    Liver Function Tests:  Recent Labs Lab 09/10/15 1118  AST 25  ALT 21  ALKPHOS 65  BILITOT 2.2*  PROT 6.1*  ALBUMIN 3.5   No results for input(s): LIPASE, AMYLASE in the last 168 hours. No results for input(s): AMMONIA in the last 168 hours. CBC:  Recent Labs Lab 09/08/15 2341 09/10/15 1118  WBC 4.4 5.4  NEUTROABS 3.0  --   HGB 11.9* 11.6*  HCT 36.0* 35.2*  MCV 87.0 86.9  PLT 213 225   Cardiac Enzymes:  Recent Labs Lab 09/08/15 2341 09/10/15 1118 09/10/15 1831  TROPONINI 0.04* 0.05* 0.04*   BNP (last 3 results)  Recent Labs  09/04/15 0030 09/08/15 2341 09/10/15 1118  BNP 313.0* 750.0* 887.0*    ProBNP (last 3 results) No results for input(s): PROBNP in the last 8760 hours.  CBG: No results for input(s): GLUCAP in the last 168  hours.  No results found for this or any previous visit (from the past 240 hour(s)).   Studies: No results found.  Scheduled Meds: . aspirin EC  81 mg Oral Daily  . budesonide (PULMICORT) nebulizer solution  0.25 mg Nebulization BID  . cloNIDine  0.1 mg Oral Daily  . furosemide  40 mg Intravenous Q12H  . losartan  100 mg Oral Daily  . metoprolol succinate  50 mg Oral Daily  . potassium chloride SA  20 mEq Oral BID  . potassium chloride  40 mEq Oral Once  . rivaroxaban  20 mg Oral Q supper  . sodium chloride flush  3 mL Intravenous Q12H   Continuous Infusions:   Principal Problem:   Acute on chronic diastolic CHF (congestive  heart failure) (HCC) Active Problems:   Essential hypertension   A-fib (HCC)   Edema of right lower extremity   Noncompliance with medication regimen   Acute respiratory failure with hypoxia (HCC)   Chronic atrial fibrillation (HCC)    Time spent: 25 minutes. Greater than 50% of this time was spent in direct contact with the patient coordinating care.    Chaya Jan  [facility] Pager [phone]  If 7PM-7AM, please contact night-coverage at [email or web address], password [facility] 09/12/2015, 5:35 PM  LOS: 2 days

## 2015-09-13 LAB — BASIC METABOLIC PANEL
ANION GAP: 12 (ref 5–15)
BUN: 24 mg/dL — ABNORMAL HIGH (ref 6–20)
CO2: 24 mmol/L (ref 22–32)
Calcium: 8.5 mg/dL — ABNORMAL LOW (ref 8.9–10.3)
Chloride: 108 mmol/L (ref 101–111)
Creatinine, Ser: 1.22 mg/dL (ref 0.61–1.24)
Glucose, Bld: 82 mg/dL (ref 65–99)
POTASSIUM: 3.3 mmol/L — AB (ref 3.5–5.1)
SODIUM: 144 mmol/L (ref 135–145)

## 2015-09-13 LAB — GLUCOSE, CAPILLARY: Glucose-Capillary: 104 mg/dL — ABNORMAL HIGH (ref 65–99)

## 2015-09-13 MED ORDER — METOPROLOL SUCCINATE ER 50 MG PO TB24
100.0000 mg | ORAL_TABLET | Freq: Every day | ORAL | Status: DC
  Administered 2015-09-13 – 2015-09-15 (×3): 100 mg via ORAL
  Filled 2015-09-13 (×2): qty 2

## 2015-09-13 MED ORDER — METOPROLOL SUCCINATE ER 50 MG PO TB24
100.0000 mg | ORAL_TABLET | Freq: Every day | ORAL | Status: DC
  Filled 2015-09-13: qty 2

## 2015-09-13 NOTE — Progress Notes (Signed)
PROGRESS NOTE  [name]:[id number] DOB: [birth year] DOA: 09/10/2015 PCP: [name]  Summary: 50 year old male with history of diastolic heart failure, noncompliance presented with increasing shortness of breath and leg swelling. Chest x-ray revealed CHF and he was admitted for diuresis and further treatment.  Assessment/Plan: 1. Acute on chronic diastolic congestive heart failure with volume overload, secondary to noncompliance with medications. Normal LVEF 05/2015. Improving with IV diuresis. 2. Acute hypoxic respiratory failure secondary to acute CHF. Resolved with diuresis. 3. Elevated troponin secondary to demand ischemia. No evidence of ACS. No further evaluation suggested. 4. Malignant hypertension. Weaning clonidine given risk for rebound hypertension with noncompliance. 5. Atrial fibrillation. CHA2DS2-VASc 2. Rate controlled with Toprol. Changed to Xarelto this admission. Xarelto increase today. 6. Noncompliance with medical regimen 7. Morbid obesity   Overall improving. Continues to have significant volume overload. Patient demonstrates limited insight into condition.  Continue IV diuresis. Replace potassium. Repeat basic metabolic panel in the morning.  Code Status: full code DVT prophylaxis: Xarelto Family Communication: none Disposition Plan: home when improved  Brendia Sacks, MD  [facility] Direct contact:  --Via amion app OR  --[email or web address]; password [email or web address] and click  7PM-7AM contact night coverage as above 09/13/2015, 5:44 PM  LOS: 3 days   Consultants:  Cardiology   Procedures:   HPI/Subjective: Wants to go home. Breathing okay. Still has lower extremity edema "always do". No pain.  Objective: Filed Vitals:   09/13/15 0700 09/13/15 1051 09/13/15 1100 09/13/15 1642  BP:    122/80  Pulse:    92  Temp:    98.6 F (37 C)  TempSrc:    Oral  Resp:    20  Height:      Weight: 135.3 kg (298 lb 4.5 oz)  135.58 kg (298 lb  14.4 oz)   SpO2:  93%  94%    Intake/Output Summary (Last 24 hours) at 09/13/15 1744 Last data filed at 09/13/15 1548  Gross per 24 hour  Intake      0 ml  Output   3365 ml  Net  -3365 ml     Filed Weights   09/12/15 0546 09/13/15 0700 09/13/15 1100  Weight: 137.2 kg (302 lb 7.5 oz) 135.3 kg (298 lb 4.5 oz) 135.58 kg (298 lb 14.4 oz)    Exam:    General:  Appears calm and comfortableSitting on side of the back. Cardiovascular: RRR, no m/r/g. Decreased lower extremity edema compared to last exam, but still has significant volume overload, still 3 plus right lower extremity, 2+ left lower extremity. Telemetry: Atrial fibrillation Respiratory: CTA bilaterally, no w/r/r. Normal respiratory effort. Musculoskeletal: grossly normal tone BUE/BLE Psychiatric: grossly normal mood and affect, speech fluent and appropriate  New data reviewed:  Potassium 3.3. Remainder of basic metabolic panel unremarkable.  Scheduled Meds: . budesonide (PULMICORT) nebulizer solution  0.25 mg Nebulization BID  . furosemide  40 mg Intravenous Q12H  . losartan  100 mg Oral Daily  . metoprolol succinate  100 mg Oral Daily  . potassium chloride SA  20 mEq Oral BID  . potassium chloride  40 mEq Oral Once  . rivaroxaban  20 mg Oral Q supper  . sodium chloride flush  3 mL Intravenous Q12H   Continuous Infusions:   Principal Problem:   Acute on chronic diastolic CHF (congestive heart failure) (HCC) Active Problems:   Essential hypertension   A-fib (HCC)   Edema of right lower extremity   Noncompliance with medication regimen  Acute respiratory failure with hypoxia (HCC)   Chronic atrial fibrillation (HCC)   Time spent 20 minutes

## 2015-09-13 NOTE — Progress Notes (Signed)
Patient ID: VIDYUTH BELSITO, male   DOB: [birth year], 50 y.o.   MRN: [id number]      Subjective:    SOB improving.   Objective:   Temp:  [97 F (36.1 C)-98.6 F (37 C)] 97 F (36.1 C) (03/23 2020) Pulse Rate:  [101-119] 115 (03/23 2020) Resp:  [16-20] 20 (03/23 2020) BP: (123-150)/(100-127) 135/100 mmHg (03/23 2020) SpO2:  [94 %-100 %] 100 % (03/23 2020) Weight:  [298 lb 4.5 oz (135.3 kg)] 298 lb 4.5 oz (135.3 kg) (03/24 0700) Last BM Date: 09/11/15  Filed Weights   09/11/15 0447 09/12/15 0546 09/13/15 0700  Weight: 305 lb 1.6 oz (138.392 kg) 302 lb 7.5 oz (137.2 kg) 298 lb 4.5 oz (135.3 kg)    Intake/Output Summary (Last 24 hours) at 09/13/15 1019 Last data filed at 09/13/15 0713  Gross per 24 hour  Intake      3 ml  Output   2475 ml  Net  -2472 ml    Telemetry: afib rates 120s  Exam:  General: NAD  HEENT: sclera clear  Resp: crackles bilateral bases  Cardiac: irreg, rate 115, no m/r/g  GI: abdomen soft, NT, ND  MSK: 2+bilaterla edema  Neuro: no focal deficits  Psych: appropriate affect  Lab Results:  Basic Metabolic Panel:  Recent Labs Lab 09/08/15 2341  09/11/15 0529 09/12/15 0535 09/13/15 0433  NA 142  < > 143 143 144  K 3.2*  < > 3.5 3.2* 3.3*  CL 110  < > 109 109 108  CO2 26  < > GLUCOSE 104*  < > 91 92 82  BUN 17  < > 21* 23* 24*  CREATININE 1.07  < > 1.21 1.18 1.22  CALCIUM 8.3*  < > 8.4* 8.3* 8.5*  MG 2.0  --   --   --   --   < > = values in this interval not displayed.  Liver Function Tests:  Recent Labs Lab 09/10/15 1118  AST 25  ALT 21  ALKPHOS 65  BILITOT 2.2*  PROT 6.1*  ALBUMIN 3.5    CBC:  Recent Labs Lab 09/08/15 2341 09/10/15 1118  WBC 4.4 5.4  HGB 11.9* 11.6*  HCT 36.0* 35.2*  MCV 87.0 86.9  PLT 213 225    Cardiac Enzymes:  Recent Labs Lab 09/08/15 2341 09/10/15 1118 09/10/15 1831  TROPONINI 0.04* 0.05* 0.04*    BNP: No results for input(s): PROBNP in the last 8760  hours.  Coagulation: No results for input(s): INR in the last 168 hours.  ECG:   Medications:   Scheduled Medications: . budesonide (PULMICORT) nebulizer solution  0.25 mg Nebulization BID  . cloNIDine  0.1 mg Oral Daily  . furosemide  40 mg Intravenous Q12H  . losartan  100 mg Oral Daily  . metoprolol succinate  50 mg Oral Daily  . potassium chloride SA  20 mEq Oral BID  . potassium chloride  40 mEq Oral Once  . rivaroxaban  20 mg Oral Q supper  . sodium chloride flush  3 mL Intravenous Q12H     Infusions:     PRN Medications:  sodium chloride, acetaminophen, ondansetron (ZOFRAN) IV, sodium chloride flush     Assessment/Plan    1. Acute on chronic diastolic HF - echo 05/2015 LVEF 55%, cannot eval diastolic function due to afib, PASP 59 - he is on lasix  IV bid, negative 6.5 liters since admission. Fairly stable renal function  2. Afib - rate  control with Toprol, stroke prevention with xarelto - rates elevated this AM, will increase Toprol to 100mg  daily.   3. HTN - difficult to control historically due to mixed compliance.  - agree with weaning clonidine due to risk for rebound. Will d/c. Hydral apparently causes tachycardia, would consider norvasc is additional agent needed or uptitration of Toprol XL. BP likely will also trend down with diuresis. With elevated afib rates will increase Toprol to 100mg .   4. Hypokalemia -replacement per primary team     Dina Rich, M.D.

## 2015-09-13 NOTE — Care Management Important Message (Signed)
Important Message  Patient Details  Name: [name] MRN: [id number] Date of Birth: September 13, 1965   Medicare Important Message Given:  Yes    [name], RN 09/13/2015, 2:05 PM

## 2015-09-14 DIAGNOSIS — I5031 Acute diastolic (congestive) heart failure: Secondary | ICD-10-CM

## 2015-09-14 LAB — BASIC METABOLIC PANEL
Anion gap: 8 (ref 5–15)
BUN: 24 mg/dL — ABNORMAL HIGH (ref 6–20)
CHLORIDE: 111 mmol/L (ref 101–111)
CO2: 26 mmol/L (ref 22–32)
CREATININE: 1.27 mg/dL — AB (ref 0.61–1.24)
Calcium: 8.5 mg/dL — ABNORMAL LOW (ref 8.9–10.3)
GFR calc non Af Amer: >60 mL/min (ref >60)
Glucose, Bld: 90 mg/dL (ref 65–99)
Potassium: 3.7 mmol/L (ref 3.5–5.1)
SODIUM: 145 mmol/L (ref 135–145)

## 2015-09-14 LAB — CBC
HCT: 35.9 % — ABNORMAL LOW (ref 39.0–52.0)
Hemoglobin: 11.6 g/dL — ABNORMAL LOW (ref 13.0–17.0)
MCH: 28.4 pg (ref 26.0–34.0)
MCHC: 32.3 g/dL (ref 30.0–36.0)
MCV: 87.8 fL (ref 78.0–100.0)
PLATELETS: 257 10*3/uL (ref 150–400)
RBC: 4.09 MIL/uL — ABNORMAL LOW (ref 4.22–5.81)
RDW: 14.7 % (ref 11.5–15.5)
WBC: 3.7 10*3/uL — ABNORMAL LOW (ref 4.0–10.5)

## 2015-09-14 MED ORDER — FUROSEMIDE 40 MG PO TABS
40.0000 mg | ORAL_TABLET | Freq: Every day | ORAL | Status: DC
  Administered 2015-09-14 – 2015-09-15 (×2): 40 mg via ORAL
  Filled 2015-09-14 (×2): qty 1

## 2015-09-14 NOTE — Progress Notes (Signed)
[facility] PROGRESS NOTE  BRADSHAW YANIK WCH:[id number] DOB: [birth year] DOA: 09/10/2015 PCP: [name]  Assessment/Plan: Acute on chronic diastolic CHF -Is 9.8 L negative since admission overnight. -Volume status much improved. -Will decrease Lasix to 40 mg daily and change to by mouth. -Has been seen by cardiology. Echo in December 2016 with normal ejection fraction of 55% with severe concentric hypertrophy. -Refuses to take aspirin.  Elevated troponin -Flat, likely related to CHF exacerbation and noncompliance. -No plans for further cardiac intervention. Cardiology following.  Atrial fibrillation -Rate controlled, continue Eliquis for anticoagulation.  Hypertension -Continue current medications. -Blood pressure improved although still elevated. Consider increase of metoprolol in a.m. if still elevated.  Morbid obesity -Noted, discussed low carb diet and exercise.  Code Status: Full code Family Communication: Patient only  Disposition Plan: To be determined, anticipate discharge in 24-48 hours   Consultants:  Cardiology   Antibiotics:  None   Subjective: Feels tired, no other complaints specifically no chest pain or shortness of breath. Is adamant that he does not want to take aspirin. Anxious to go home  Objective: Filed Vitals:   09/14/15 0607 09/14/15 1001 09/14/15 1214 09/14/15 1529  BP: 137/98   145/117  Pulse: 102   53  Temp: 98.2 F (36.8 C)   98.8 F (37.1 C)  TempSrc: Oral   Oral  Resp: 22   20  Height:      Weight: 135.2 kg (298 lb 1 oz)  135.263 kg (298 lb 3.2 oz)   SpO2: 91% 95%  9%    Intake/Output Summary (Last 24 hours) at 09/14/15 1629 Last data filed at 09/14/15 1436  Gross per 24 hour  Intake    240 ml  Output   2800 ml  Net  -2560 ml   Filed Weights   09/13/15 1100 09/14/15 0607 09/14/15 1214  Weight: 135.58 kg (298 lb 14.4 oz) 135.2 kg (298 lb 1 oz) 135.263 kg (298 lb 3.2 oz)     Exam:   General:  Alert, awake, oriented 3  Cardiovascular: Irregular rhythm, no murmurs, rubs or gallops  Respiratory: Mild bibasilar crackles  Abdomen: Obese, soft, nontender, nondistended, positive bowel sounds  Extremities: 2+ pitting edema bilaterally   Neurologic:  Grossly intact and nonfocal  Data Reviewed: Basic Metabolic Panel:  Recent Labs Lab 09/08/15 2341 09/10/15 1118 09/11/15 0529 09/12/15 0535 09/13/15 0433 09/14/15 0702  NA 142 143 143 143 144 145  K 3.2* 3.3* 3.5 3.2* 3.3* 3.7  CL 110 109 109 109 108 111  CO2 26 24 25 25 24 26   GLUCOSE 104* 94 91 92 82 90  BUN 17 18 21* 23* 24* 24*  CREATININE 1.07 1.08 1.21 1.18 1.22 1.27*  CALCIUM 8.3* 8.6* 8.4* 8.3* 8.5* 8.5*  MG 2.0  --   --   --   --   --    Liver Function Tests:  Recent Labs Lab 09/10/15 1118  AST 25  ALT 21  ALKPHOS 65  BILITOT 2.2*  PROT 6.1*  ALBUMIN 3.5   No results for input(s): LIPASE, AMYLASE in the last 168 hours. No results for input(s): AMMONIA in the last 168 hours. CBC:  Recent Labs Lab 09/08/15 2341 09/10/15 1118 09/14/15 0702  WBC 4.4 5.4 3.7*  NEUTROABS 3.0  --   --   HGB 11.9* 11.6* 11.6*  HCT 36.0* 35.2* 35.9*  MCV 87.0 86.9 87.8  PLT 213 225 257   Cardiac Enzymes:  Recent Labs  Lab 09/08/15 2341 09/10/15 1118 09/10/15 1831  TROPONINI 0.04* 0.05* 0.04*   BNP (last 3 results)  Recent Labs  09/04/15 0030 09/08/15 2341 09/10/15 1118  BNP 313.0* 750.0* 887.0*    ProBNP (last 3 results) No results for input(s): PROBNP in the last 8760 hours.  CBG:  Recent Labs Lab 09/13/15 1721  GLUCAP 104*    No results found for this or any previous visit (from the past 240 hour(s)).   Studies: No results found.  Scheduled Meds: . budesonide (PULMICORT) nebulizer solution  0.25 mg Nebulization BID  . furosemide  40 mg Intravenous Q12H  . losartan  100 mg Oral Daily  . metoprolol succinate  100 mg Oral Daily  . potassium chloride SA  20  mEq Oral BID  . potassium chloride  40 mEq Oral Once  . rivaroxaban  20 mg Oral Q supper  . sodium chloride flush  3 mL Intravenous Q12H   Continuous Infusions:   Principal Problem:   Acute on chronic diastolic CHF (congestive heart failure) (HCC) Active Problems:   Essential hypertension   A-fib (HCC)   Edema of right lower extremity   Noncompliance with medication regimen   Acute respiratory failure with hypoxia (HCC)   Chronic atrial fibrillation (HCC)    Time spent: 25 minutes. Greater than 50% of this time was spent in direct contact with the patient coordinating care.    Chaya Jan  [facility] Pager [phone]  If 7PM-7AM, please contact night-coverage at [email or web address], password [email or web address] 09/14/2015, 4:29 PM  LOS: 4 days

## 2015-09-15 ENCOUNTER — Inpatient Hospital Stay (HOSPITAL_COMMUNITY): Payer: Medicare Other

## 2015-09-15 LAB — URINALYSIS, ROUTINE W REFLEX MICROSCOPIC
Bilirubin Urine: NEGATIVE
Glucose, UA: NEGATIVE mg/dL
Hgb urine dipstick: NEGATIVE
KETONES UR: NEGATIVE mg/dL
LEUKOCYTES UA: NEGATIVE
NITRITE: NEGATIVE
PH: 5 (ref 5.0–8.0)
Protein, ur: NEGATIVE mg/dL
SPECIFIC GRAVITY, URINE: 1.015 (ref 1.005–1.030)

## 2015-09-15 LAB — BASIC METABOLIC PANEL
ANION GAP: 9 (ref 5–15)
BUN: 21 mg/dL — ABNORMAL HIGH (ref 6–20)
CHLORIDE: 111 mmol/L (ref 101–111)
CO2: 25 mmol/L (ref 22–32)
Calcium: 8.3 mg/dL — ABNORMAL LOW (ref 8.9–10.3)
Creatinine, Ser: 1.24 mg/dL (ref 0.61–1.24)
GFR calc non Af Amer: >60 mL/min (ref >60)
Glucose, Bld: 93 mg/dL (ref 65–99)
Potassium: 3.6 mmol/L (ref 3.5–5.1)
Sodium: 145 mmol/L (ref 135–145)

## 2015-09-15 LAB — INFLUENZA PANEL BY PCR (TYPE A & B)
H1N1FLUPCR: NOT DETECTED
INFLAPCR: NEGATIVE
Influenza B By PCR: NEGATIVE

## 2015-09-15 MED ORDER — RIVAROXABAN 20 MG PO TABS: 20.0000 mg | ORAL_TABLET | Freq: Every day | ORAL | Status: DC

## 2015-09-15 MED ORDER — FUROSEMIDE 40 MG PO TABS: 40.0000 mg | ORAL_TABLET | Freq: Every day | ORAL | Status: DC

## 2015-09-15 MED ORDER — APIXABAN 5 MG PO TABS: 5.0000 mg | ORAL_TABLET | Freq: Two times a day (BID) | ORAL | Status: DC

## 2015-09-15 MED ORDER — METOPROLOL SUCCINATE ER 100 MG PO TB24: 100.0000 mg | ORAL_TABLET | Freq: Every day | ORAL | Status: DC

## 2015-09-15 MED ORDER — ATORVASTATIN CALCIUM 80 MG PO TABS: 80.0000 mg | ORAL_TABLET | Freq: Every day | ORAL | Status: DC

## 2015-09-15 MED ORDER — LOSARTAN POTASSIUM 100 MG PO TABS: 100.0000 mg | ORAL_TABLET | Freq: Every day | ORAL | Status: DC

## 2015-09-15 NOTE — Discharge Summary (Addendum)
Physician Discharge Summary  [name]:[id number] DOB: June 11, 1966 DOA: 09/10/2015  PCP: [name]  Admit date: 09/10/2015 Discharge date: 09/15/2015  Time spent: 45 minutes  Recommendations for Outpatient Follow-up:  -Will be discharged home today. -Advised to follow up with PCP in 2 weeks and with cardiology in 1 week.   Discharge Diagnoses:  Principal Problem:   Acute on chronic diastolic CHF (congestive heart failure) (HCC) Active Problems:   Essential hypertension   A-fib (HCC)   Edema of right lower extremity   Noncompliance with medication regimen   Acute respiratory failure with hypoxia (HCC)   Chronic atrial fibrillation [facility])   Discharge Condition: Stable and improved  Filed Weights   09/14/15 0607 09/14/15 1214 09/15/15 0500  Weight: 135.2 kg (298 lb 1 oz) 135.263 kg (298 lb 3.2 oz) 133.085 kg (293 lb 6.4 oz)    History of present illness:  As per Dr. Irene Fitzpatrick on 3/21: 50 year old male with history of diastolic heart failure, noncompliance presented with increasing shortness of breath and leg swelling. Chest x-ray revealed CHF and he was admitted for diuresis and further treatment.  Patient's been followed by cardiology for diastolic heart failure noncompliance has been noted. Patient does not consistently take his medications. Last several bases noted increasing lower extremity edema right greater than left as well as progressive dyspnea on exertion with only mild improvement at rest. He's had no chest pain. He reports that he does not add salt to his diet.  In the emergency department: temp 100.3, RR 30, SpO2 100% Pertinent labs: Potassium 3.3. Total bilirubin 2.2. Remainder CMP unremarkable. BNP 887. Troponin 0.05. CBC unremarkable. EKG: Independently reviewed. Afib RVR, no acute changes. Imaging: CXR independently reviewed. Worsening CHF.   Hospital Course:   Acute on chronic diastolic CHF -Is 9.9 L negative since admission  overnight. -Volume status much improved. -Will decrease Lasix to 40 mg daily and change to by mouth. -Has been seen by cardiology. Echo in December 2016 with normal ejection fraction of 55% with severe concentric hypertrophy. -Refuses to take aspirin. -Continue BB, ARB. Statin.  Elevated troponin -Flat, likely related to CHF exacerbation and noncompliance. -No plans for further cardiac intervention. Cardiology following.  Atrial fibrillation -Rate controlled, continue Xarelto for anticoagulation.  Hypertension -BP meds have been adjusted and he has been instructed in the importance of compliance. -He has been weaned off clonidine given potential for rebound HTN with non-compliance.  Morbid obesity -Noted, discussed low carb diet and exercise.  Procedures:  None   Consultations:  cardiology  Discharge Instructions      Discharge Instructions    Diet - low sodium heart healthy    Complete by:  As directed      Increase activity slowly    Complete by:  As directed             Medication List    STOP taking these medications        apixaban 5 MG Tabs tablet  Commonly known as:  ELIQUIS     cloNIDine 0.1 MG tablet  Commonly known as:  CATAPRES     diltiazem 360 MG 24 hr capsule  Commonly known as:  CARDIZEM CD     spironolactone 25 MG tablet  Commonly known as:  ALDACTONE      TAKE these medications        ALKA-SELTZER ANTACID PO  Take 1 tablet by mouth as needed (upset stomach).     aspirin 81  MG EC tablet  Take 1 tablet (81 mg total) by mouth daily.     atorvastatin 80 MG tablet  Commonly known as:  LIPITOR  Take 1 tablet (80 mg total) by mouth daily at 6 PM.     beclomethasone 80 MCG/ACT inhaler  Commonly known as:  QVAR  Inhale 2 puffs into the lungs daily.     furosemide 40 MG tablet  Commonly known as:  LASIX  Take 1 tablet (40 mg total) by mouth daily.     losartan 100 MG tablet  Commonly known as:  COZAAR  Take 1 tablet (100 mg  total) by mouth daily.     metoprolol succinate 100 MG 24 hr tablet  Commonly known as:  TOPROL-XL  Take 1 tablet (100 mg total) by mouth daily. Take with or immediately following a meal.     potassium chloride SA 20 MEQ tablet  Commonly known as:  K-DUR,KLOR-CON  Take 1 tablet (20 mEq total) by mouth 2 (two) times daily.     rivaroxaban 20 MG Tabs tablet  Commonly known as:  XARELTO  Take 1 tablet (20 mg total) by mouth daily with supper.       Allergies  Allergen Reactions  . Bee Venom Shortness Of Breath and Swelling  . Aspirin Other (See Comments)    Chewable 81 mg tablets upsets patients stomach  . Banana Diarrhea  . Diltiazem Other (See Comments)    Makes heart beat fast  . Hydralazine Other (See Comments)    Patient states it causes Tachycardia  . Lipitor [Atorvastatin] Other (See Comments)    Nose bleed   Follow-up Information    Follow up with H[facility]. Schedule an appointment as soon as possible for a visit in 2 weeks.   Contact information:   [address] [place] [address] [phone]       Follow up with Dina Rich, MD. Schedule an appointment as soon as possible for a visit in 1 week.   Specialty:  Cardiology   Contact information:   [address] [place] [address] [phone]        The results of significant diagnostics from this hospitalization (including imaging, microbiology, ancillary and laboratory) are listed below for reference.    Significant Diagnostic Studies: Dg Chest 2 View  09/15/2015  CLINICAL DATA:  Elevated temperature, swelling in the legs EXAM: CHEST  2 VIEW COMPARISON:  09/10/2015 FINDINGS: Cardiomegaly. Mild interstitial prominence bilateral lower lobe suspicious for residual mild interstitial edema with improvement from prior exam. No segmental infiltrate. No pleural effusion. IMPRESSION: Cardiomegaly. Mild interstitial prominence bilateral lower lobe suspicious for residual mild interstitial  edema with improvement from prior exam. No segmental infiltrate. No pleural effusion. Electronically Signed   By: Natasha Mead M.D.   On: 09/15/2015 10:07   Dg Chest 2 View  09/10/2015  CLINICAL DATA:  Short of breath.  Lower extremity swelling. EXAM: CHEST  2 VIEW COMPARISON:  09/08/2015 FINDINGS: Cardiomegaly. Moderate pulmonary edema. No pneumothorax. No pleural effusion. IMPRESSION: Worsening CHF. Electronically Signed   By: Jolaine Click M.D.   On: 09/10/2015 12:01   Dg Chest 2 View  09/09/2015  CLINICAL DATA:  Hypotension, shortness of breath, swelling in the legs. EXAM: CHEST  2 VIEW COMPARISON:  09/04/2015 FINDINGS: Cardiac enlargement with mild vascular congestion. Mild interstitial edema. Appearance is similar to previous study. No blunting of costophrenic angles. No pneumothorax. Small amount of fluid in the fissures. Mediastinal contours appear  intact. IMPRESSION: Cardiac enlargement with mild pulmonary vascular congestion and mild interstitial edema. Small amount of fluid in the fissures. Electronically Signed   By: Burman Nieves M.D.   On: 09/09/2015 00:34   Dg Chest 2 View  09/04/2015  CLINICAL DATA:  Acute onset of shortness of breath and bilateral lower extremity swelling. Initial encounter. EXAM: CHEST  2 VIEW COMPARISON:  Chest radiograph performed 09/03/2015 FINDINGS: The lungs are well-aerated. Vascular congestion is noted. Bibasilar airspace opacities may reflect mild interstitial edema. There is no evidence of pleural effusion or pneumothorax. The heart is enlarged.  No acute osseous abnormalities are seen. IMPRESSION: Vascular congestion and cardiomegaly. Bibasilar airspace opacities may reflect mild interstitial edema. Electronically Signed   By: Roanna Raider M.D.   On: 09/04/2015 01:28   Dg Chest 2 View  09/03/2015  CLINICAL DATA:  Shortness of breath for 1 week. History of asthma, atrial fibrillation, CHF, hypertension, hyperlipidemia, morbid obesity. EXAM: CHEST  2 VIEW  COMPARISON:  Chest radiograph August 02, 2015 FINDINGS: The cardiac silhouette appears moderately enlarged, similar. Mediastinal silhouette is nonsuspicious. Pulmonary vascular congestion, without pleural effusion or focal consolidation. Improved aeration of RIGHT lung base. No pneumothorax. Large body habitus. Osseous structure nonsuspicious. IMPRESSION: Similar cardiomegaly and pulmonary vascular congestion. Electronically Signed   By: Awilda Metro M.D.   On: 09/03/2015 06:59    Microbiology: Recent Results (from the past 240 hour(s))  Culture, blood (Routine X 2) w Reflex to ID Panel     Status: None (Preliminary result)   Collection Time: 09/15/15  7:11 AM  Result Value Ref Range Status   Specimen Description BLOOD RIGHT HAND  Final   Special Requests BOTTLES DRAWN AEROBIC AND ANAEROBIC [facility] EACH  Final   Culture PENDING  Incomplete   Report Status PENDING  Incomplete  Culture, blood (Routine X 2) w Reflex to ID Panel     Status: None (Preliminary result)   Collection Time: 09/15/15  7:12 AM  Result Value Ref Range Status   Specimen Description RIGHT ANTECUBITAL  Final   Special Requests BOTTLES DRAWN AEROBIC AND ANAEROBIC [facility] EACH  Final   Culture PENDING  Incomplete   Report Status PENDING  Incomplete     Labs: Basic Metabolic Panel:  Recent Labs Lab 09/08/15 2341  09/11/15 0529 09/12/15 0535 09/13/15 0433 09/14/15 0702 09/15/15 0711  NA 142  < > 143 143 144 145 145  K 3.2*  < > 3.5 3.2* 3.3* 3.7 3.6  CL 110  < > 109 109 108 111 111  CO2 26  < > 25 25 24 26 25   GLUCOSE 104*  < > 91 92 82 90 93  BUN 17  < > 21* 23* 24* 24* 21*  CREATININE 1.07  < > 1.21 1.18 1.22 1.27* 1.24  CALCIUM 8.3*  < > 8.4* 8.3* 8.5* 8.5* 8.3*  MG 2.0  --   --   --   --   --   --   < > = values in this interval not displayed. Liver Function Tests:  Recent Labs Lab 09/10/15 1118  AST 25  ALT 21  ALKPHOS 65  BILITOT 2.2*  PROT 6.1*  ALBUMIN 3.5   No results for input(s): LIPASE,  AMYLASE in the last 168 hours. No results for input(s): AMMONIA in the last 168 hours. CBC:  Recent Labs Lab 09/08/15 2341 09/10/15 1118 09/14/15 0702  WBC 4.4 5.4 3.7*  NEUTROABS 3.0  --   --   HGB 11.9*  11.6* 11.6*  HCT 36.0* 35.2* 35.9*  MCV 87.0 86.9 87.8  PLT 213 225 257   Cardiac Enzymes:  Recent Labs Lab 09/08/15 2341 09/10/15 1118 09/10/15 1831  TROPONINI 0.04* 0.05* 0.04*   BNP: BNP (last 3 results)  Recent Labs  09/04/15 0030 09/08/15 2341 09/10/15 1118  BNP 313.0* 750.0* 887.0*    ProBNP (last 3 results) No results for input(s): PROBNP in the last 8760 hours.  CBG:  Recent Labs Lab 09/13/15 1721  GLUCAP 104*       Signed:  HERNANDEZ ACOSTA,ESTELA  [facility] Pager: [phone] 09/15/2015, 1:37 PM

## 2015-09-15 NOTE — Progress Notes (Signed)
Discharged via wheelchair accompanied by staff for discharge home in care of family.  Stable at discharge. 

## 2015-09-15 NOTE — Progress Notes (Signed)
Central telemetry notified of discharge, telemetry removed.  IV access removed.  Discharge instructions reviewed with patient, questions answered, understanding verbalized.  Waiting transportation.

## 2015-09-18 ENCOUNTER — Encounter (HOSPITAL_COMMUNITY): Payer: Self-pay | Admitting: Emergency Medicine

## 2015-09-18 ENCOUNTER — Emergency Department (HOSPITAL_COMMUNITY): Payer: Medicare Other

## 2015-09-18 ENCOUNTER — Inpatient Hospital Stay (HOSPITAL_COMMUNITY)
Admission: EM | Admit: 2015-09-18 | Discharge: 2015-09-22 | DRG: 314 | Disposition: A | Discharge status: Home / Self Care | Payer: Medicare Other | Attending: Internal Medicine | Admitting: Internal Medicine

## 2015-09-18 DIAGNOSIS — Z7901 Long term (current) use of anticoagulants: Secondary | ICD-10-CM

## 2015-09-18 DIAGNOSIS — I272 Other secondary pulmonary hypertension: Secondary | ICD-10-CM | POA: Diagnosis not present

## 2015-09-18 DIAGNOSIS — I248 Other forms of acute ischemic heart disease: Secondary | ICD-10-CM | POA: Diagnosis present

## 2015-09-18 DIAGNOSIS — G4733 Obstructive sleep apnea (adult) (pediatric): Secondary | ICD-10-CM | POA: Diagnosis present

## 2015-09-18 DIAGNOSIS — R778 Other specified abnormalities of plasma proteins: Secondary | ICD-10-CM | POA: Diagnosis present

## 2015-09-18 DIAGNOSIS — D72819 Decreased white blood cell count, unspecified: Secondary | ICD-10-CM | POA: Diagnosis present

## 2015-09-18 DIAGNOSIS — I481 Persistent atrial fibrillation: Secondary | ICD-10-CM | POA: Diagnosis not present

## 2015-09-18 DIAGNOSIS — R7881 Bacteremia: Secondary | ICD-10-CM | POA: Diagnosis present

## 2015-09-18 DIAGNOSIS — E662 Morbid (severe) obesity with alveolar hypoventilation: Secondary | ICD-10-CM | POA: Diagnosis present

## 2015-09-18 DIAGNOSIS — I110 Hypertensive heart disease with heart failure: Secondary | ICD-10-CM | POA: Diagnosis present

## 2015-09-18 DIAGNOSIS — B9689 Other specified bacterial agents as the cause of diseases classified elsewhere: Secondary | ICD-10-CM | POA: Diagnosis present

## 2015-09-18 DIAGNOSIS — R509 Fever, unspecified: Secondary | ICD-10-CM

## 2015-09-18 DIAGNOSIS — K219 Gastro-esophageal reflux disease without esophagitis: Secondary | ICD-10-CM | POA: Diagnosis present

## 2015-09-18 DIAGNOSIS — Z7982 Long term (current) use of aspirin: Secondary | ICD-10-CM

## 2015-09-18 DIAGNOSIS — D649 Anemia, unspecified: Secondary | ICD-10-CM | POA: Diagnosis present

## 2015-09-18 DIAGNOSIS — Z9119 Patient's noncompliance with other medical treatment and regimen: Secondary | ICD-10-CM

## 2015-09-18 DIAGNOSIS — I509 Heart failure, unspecified: Secondary | ICD-10-CM

## 2015-09-18 DIAGNOSIS — Z8249 Family history of ischemic heart disease and other diseases of the circulatory system: Secondary | ICD-10-CM

## 2015-09-18 DIAGNOSIS — R0600 Dyspnea, unspecified: Secondary | ICD-10-CM

## 2015-09-18 DIAGNOSIS — I482 Chronic atrial fibrillation: Secondary | ICD-10-CM | POA: Diagnosis present

## 2015-09-18 DIAGNOSIS — F411 Generalized anxiety disorder: Secondary | ICD-10-CM | POA: Diagnosis not present

## 2015-09-18 DIAGNOSIS — I5033 Acute on chronic diastolic (congestive) heart failure: Secondary | ICD-10-CM | POA: Diagnosis not present

## 2015-09-18 DIAGNOSIS — J45901 Unspecified asthma with (acute) exacerbation: Secondary | ICD-10-CM

## 2015-09-18 DIAGNOSIS — J45909 Unspecified asthma, uncomplicated: Secondary | ICD-10-CM | POA: Diagnosis present

## 2015-09-18 DIAGNOSIS — I4891 Unspecified atrial fibrillation: Secondary | ICD-10-CM | POA: Diagnosis present

## 2015-09-18 DIAGNOSIS — Z87891 Personal history of nicotine dependence: Secondary | ICD-10-CM

## 2015-09-18 DIAGNOSIS — Z823 Family history of stroke: Secondary | ICD-10-CM

## 2015-09-18 DIAGNOSIS — R0602 Shortness of breath: Secondary | ICD-10-CM

## 2015-09-18 DIAGNOSIS — Z6841 Body Mass Index (BMI) 40.0 and over, adult: Secondary | ICD-10-CM

## 2015-09-18 DIAGNOSIS — I5032 Chronic diastolic (congestive) heart failure: Secondary | ICD-10-CM | POA: Diagnosis present

## 2015-09-18 DIAGNOSIS — I10 Essential (primary) hypertension: Secondary | ICD-10-CM | POA: Diagnosis present

## 2015-09-18 DIAGNOSIS — E785 Hyperlipidemia, unspecified: Secondary | ICD-10-CM | POA: Diagnosis present

## 2015-09-18 DIAGNOSIS — R7989 Other specified abnormal findings of blood chemistry: Secondary | ICD-10-CM | POA: Diagnosis present

## 2015-09-18 HISTORY — DX: Patient's noncompliance with other medical treatment and regimen due to unspecified reason: Z91.199

## 2015-09-18 HISTORY — DX: Patient's noncompliance with other medical treatment and regimen: Z91.19

## 2015-09-18 LAB — CBC WITH DIFFERENTIAL/PLATELET
Basophils Absolute: 0 10*3/uL (ref 0.0–0.1)
Basophils Relative: 1 %
Eosinophils Absolute: 0 10*3/uL (ref 0.0–0.7)
Eosinophils Relative: 1 %
HEMATOCRIT: 36.2 % — AB (ref 39.0–52.0)
HEMOGLOBIN: 11.8 g/dL — AB (ref 13.0–17.0)
LYMPHS PCT: 35 %
Lymphs Abs: 1 10*3/uL (ref 0.7–4.0)
MCH: 28.4 pg (ref 26.0–34.0)
MCHC: 32.6 g/dL (ref 30.0–36.0)
MCV: 87.2 fL (ref 78.0–100.0)
MONO ABS: 0.2 10*3/uL (ref 0.1–1.0)
MONOS PCT: 8 %
NEUTROS ABS: 1.6 10*3/uL — AB (ref 1.7–7.7)
NEUTROS PCT: 55 %
PLATELETS: 236 10*3/uL (ref 150–400)
RBC: 4.15 MIL/uL — ABNORMAL LOW (ref 4.22–5.81)
RDW: 14.7 % (ref 11.5–15.5)
WBC: 2.9 10*3/uL — ABNORMAL LOW (ref 4.0–10.5)

## 2015-09-18 LAB — COMPREHENSIVE METABOLIC PANEL
ALK PHOS: 63 U/L (ref 38–126)
ALT: 25 U/L (ref 17–63)
AST: 32 U/L (ref 15–41)
Albumin: 3.4 g/dL — ABNORMAL LOW (ref 3.5–5.0)
Anion gap: 10 (ref 5–15)
BUN: 21 mg/dL — AB (ref 6–20)
CALCIUM: 8 mg/dL — AB (ref 8.9–10.3)
CHLORIDE: 104 mmol/L (ref 101–111)
CO2: 23 mmol/L (ref 22–32)
CREATININE: 1.17 mg/dL (ref 0.61–1.24)
GFR calc non Af Amer: >60 mL/min (ref >60)
GLUCOSE: 98 mg/dL (ref 65–99)
Potassium: 3.4 mmol/L — ABNORMAL LOW (ref 3.5–5.1)
Sodium: 137 mmol/L (ref 135–145)
Total Bilirubin: 1.3 mg/dL — ABNORMAL HIGH (ref 0.3–1.2)
Total Protein: 6.3 g/dL — ABNORMAL LOW (ref 6.5–8.1)

## 2015-09-18 LAB — TROPONIN I: Troponin I: 0.05 ng/mL — ABNORMAL HIGH (ref <0.031)

## 2015-09-18 LAB — BRAIN NATRIURETIC PEPTIDE: B Natriuretic Peptide: 621 pg/mL — ABNORMAL HIGH (ref 0.0–100.0)

## 2015-09-18 LAB — LACTIC ACID, PLASMA: Lactic Acid, Venous: 1 mmol/L (ref 0.5–2.0)

## 2015-09-18 MED ORDER — ASPIRIN EC 81 MG PO TBEC
81.0000 mg | DELAYED_RELEASE_TABLET | Freq: Every day | ORAL | Status: DC
  Administered 2015-09-19 – 2015-09-22 (×2): 81 mg via ORAL
  Filled 2015-09-18 (×4): qty 1

## 2015-09-18 MED ORDER — ALBUTEROL SULFATE (2.5 MG/3ML) 0.083% IN NEBU
2.5000 mg | INHALATION_SOLUTION | Freq: Once | RESPIRATORY_TRACT | Status: AC
  Administered 2015-09-18: 2.5 mg via RESPIRATORY_TRACT
  Filled 2015-09-18: qty 3

## 2015-09-18 MED ORDER — POTASSIUM CHLORIDE CRYS ER 20 MEQ PO TBCR
20.0000 meq | EXTENDED_RELEASE_TABLET | Freq: Two times a day (BID) | ORAL | Status: DC
  Administered 2015-09-19 (×2): 20 meq via ORAL
  Filled 2015-09-18 (×2): qty 1

## 2015-09-18 MED ORDER — ATORVASTATIN CALCIUM 40 MG PO TABS
80.0000 mg | ORAL_TABLET | Freq: Every day | ORAL | Status: DC
  Administered 2015-09-19 – 2015-09-20 (×2): 80 mg via ORAL
  Filled 2015-09-18 (×4): qty 2

## 2015-09-18 MED ORDER — LEVALBUTEROL HCL 0.63 MG/3ML IN NEBU: 0.6300 mg | INHALATION_SOLUTION | Freq: Four times a day (QID) | RESPIRATORY_TRACT | Status: DC | PRN

## 2015-09-18 MED ORDER — METOPROLOL SUCCINATE ER 50 MG PO TB24
100.0000 mg | ORAL_TABLET | Freq: Every day | ORAL | Status: DC
  Administered 2015-09-19 – 2015-09-22 (×4): 100 mg via ORAL
  Filled 2015-09-18 (×5): qty 2

## 2015-09-18 MED ORDER — BUMETANIDE 0.25 MG/ML IJ SOLN
1.0000 mg | Freq: Two times a day (BID) | INTRAMUSCULAR | Status: DC
  Administered 2015-09-19 – 2015-09-21 (×6): 1 mg via INTRAVENOUS
  Filled 2015-09-18 (×6): qty 4

## 2015-09-18 MED ORDER — IPRATROPIUM-ALBUTEROL 0.5-2.5 (3) MG/3ML IN SOLN
3.0000 mL | Freq: Once | RESPIRATORY_TRACT | Status: AC
  Administered 2015-09-18: 3 mL via RESPIRATORY_TRACT
  Filled 2015-09-18: qty 3

## 2015-09-18 MED ORDER — FUROSEMIDE 10 MG/ML IJ SOLN
40.0000 mg | Freq: Once | INTRAMUSCULAR | Status: AC
  Administered 2015-09-18: 40 mg via INTRAVENOUS
  Filled 2015-09-18: qty 4

## 2015-09-18 MED ORDER — LOSARTAN POTASSIUM 50 MG PO TABS
100.0000 mg | ORAL_TABLET | Freq: Every day | ORAL | Status: DC
  Administered 2015-09-19 – 2015-09-22 (×4): 100 mg via ORAL
  Filled 2015-09-18 (×5): qty 2

## 2015-09-18 MED ORDER — ONDANSETRON HCL 4 MG/2ML IJ SOLN: 4.0000 mg | Freq: Four times a day (QID) | INTRAMUSCULAR | Status: DC | PRN

## 2015-09-18 MED ORDER — BECLOMETHASONE DIPROPIONATE 80 MCG/ACT IN AERS: 2.0000 | INHALATION_SPRAY | Freq: Every day | RESPIRATORY_TRACT | Status: DC

## 2015-09-18 MED ORDER — ONDANSETRON HCL 4 MG PO TABS: 4.0000 mg | ORAL_TABLET | Freq: Four times a day (QID) | ORAL | Status: DC | PRN

## 2015-09-18 MED ORDER — ACETAMINOPHEN 325 MG PO TABS
650.0000 mg | ORAL_TABLET | Freq: Once | ORAL | Status: AC
  Administered 2015-09-19: 650 mg via ORAL
  Filled 2015-09-18: qty 2

## 2015-09-18 MED ORDER — METHYLPREDNISOLONE SODIUM SUCC 125 MG IJ SOLR
125.0000 mg | Freq: Once | INTRAMUSCULAR | Status: AC
  Administered 2015-09-18: 125 mg via INTRAVENOUS
  Filled 2015-09-18: qty 2

## 2015-09-18 MED ORDER — RIVAROXABAN 20 MG PO TABS
20.0000 mg | ORAL_TABLET | Freq: Every day | ORAL | Status: DC
  Administered 2015-09-19 – 2015-09-21 (×3): 20 mg via ORAL
  Filled 2015-09-18 (×4): qty 1

## 2015-09-18 MED ORDER — SODIUM CHLORIDE 0.9% FLUSH
3.0000 mL | Freq: Two times a day (BID) | INTRAVENOUS | Status: DC
  Administered 2015-09-19 – 2015-09-21 (×6): 3 mL via INTRAVENOUS

## 2015-09-18 NOTE — ED Notes (Signed)
Pt c/o lower leg swelling and sob. Pt was discharged from hospital last week for the same.

## 2015-09-18 NOTE — ED Notes (Signed)
Tried to ambulated patient, he stood up to start walking and he became very light headed sats dropped to 56 assisted patient back to the bed and made the Doctor aware of results.

## 2015-09-18 NOTE — H&P (Signed)
[facility] History and Physical  [name] ZOX:[id number] DOB: 11/16/1965    PCP:   [facility]   Chief Complaint: Shortness of breath  HPI: [name] is an 50 y.o. male  with past medical history of HLD, HTN, GERD, sleep apnea (does not use CPAP) chronic diastolic CHF. was recently discharge for acute CHF. Today he presents with complaints of gradual and worsening persistent SOB, headaches, generalized weakness, chest pain, and pedal edema since discharge. He reports compliance with medications and diet since discharge. Denies anxiety or being treated at heart failure clinic. He reports that the headaches and weakness onset after he started taking the Xarelto.  Workup in then ED revealed  Potassium 3.4; BNP 621; Troponin .05; Creatinine 1.17; WBC 2.9 and Hgb 11.8. He will be admitted for CHF and further evaluation.   Rewiew of Systems:  Constitutional: Negative for malaise, fever and chills. No significant weight loss or weight gain Eyes: Negative for eye pain, redness and discharge, diplopia, visual changes, or flashes of light. ENMT: Negative for ear pain, hoarseness, nasal congestion, sinus pressure and sore throat. No tinnitus, drooling, or problem swallowing. Positive for headache Cardiovascular: Negative for palpitations, diaphoresis, dyspnea and peripheral edema.. No  PND Positive for chest pain, orthopnea, Respiratory: Negative for cough, hemoptysis, wheezing and stridor. No pleuritic chestpain. Gastrointestinal: Negative for nausea, vomiting, diarrhea, constipation, abdominal pain, melena, blood in stool, hematemesis, jaundice and rectal bleeding.    Genitourinary: Negative for frequency, dysuria, incontinence,flank pain and hematuria; Musculoskeletal: Negative for back pain and neck pain. Negative for swelling and trauma.;  Skin: . Negative for pruritus, rash, abrasions, bruising and skin lesion.; ulcerations Neuro: Negative for headache,  lightheadedness and neck stiffness. Negative for weakness, altered level of consciousness , altered mental status, extremity weakness, burning feet, involuntary movement, seizure and syncope.  Psych: negative for anxiety, depression, insomnia, tearfulness, panic attacks, hallucinations, paranoia, suicidal or homicidal ideation    Past Medical History  Diagnosis Date  . Hypertensive heart disease     Hypertensive heart disease with prior episodes of CHF  . Hyperlipidemia   . Hypertension   . Gastroesophageal reflux disease   . Osteoarthritis   . Peptic ulcer disease   . Morbid obesity (HCC)     /sleep apnea  . Allergic rhinitis     plus h/o asthma  . Tobacco abuse, in remission     Cigarettes discontinued in 2010  . CHF (congestive heart failure) (HCC)   . A-fib (HCC)     2015  . Asthma   . Noncompliance     Past Surgical History  Procedure Laterality Date  . None    . Cardiac catheterization N/A 06/14/2015    Procedure: Right/Left Heart Cath and Coronary Angiography;  Surgeon: Runell Gess, MD;  Location: [facility] INVASIVE CV LAB;  Service: Cardiovascular;  Laterality: N/A;    Medications:  HOME MEDS: Prior to Admission medications   Medication Sig Start Date End Date Taking? Authorizing Provider  atorvastatin (LIPITOR) 80 MG tablet Take 1 tablet (80 mg total) by mouth daily at 6 PM. 09/15/15  Yes Estela Isaiah Blakes, MD  beclomethasone (QVAR) 80 MCG/ACT inhaler Inhale 2 puffs into the lungs daily.    Yes Historical Provider, MD  furosemide (LASIX) 40 MG tablet Take 1 tablet (40 mg total) by mouth daily. 09/15/15  Yes Henderson Cloud, MD  losartan (COZAAR) 100 MG tablet Take 1 tablet (100 mg total) by mouth daily. 09/15/15  Yes Henderson Cloud, MD  metoprolol succinate (TOPROL-XL) 100 MG 24 hr tablet Take 1 tablet (100 mg total) by mouth daily. Take with or immediately following a meal. 09/15/15  Yes Estela Isaiah Blakes, MD  potassium chloride SA  (K-DUR,KLOR-CON) 20 MEQ tablet Take 1 tablet (20 mEq total) by mouth 2 (two) times daily. 09/04/15  Yes Devoria Albe, MD  rivaroxaban (XARELTO) 20 MG TABS tablet Take 1 tablet (20 mg total) by mouth daily with supper. Patient taking differently: Take 20 mg by mouth daily.  09/15/15  Yes Henderson Cloud, MD  aspirin EC 81 MG EC tablet Take 1 tablet (81 mg total) by mouth daily. Patient not taking: Reported on 09/18/2015 06/17/15   [name], MD     Allergies:  Allergies  Allergen Reactions  . Bee Venom Shortness Of Breath and Swelling  . Aspirin Other (See Comments)    Chewable 81 mg tablets upsets patients stomach  . Banana Diarrhea  . Diltiazem Other (See Comments)    Makes heart beat fast  . Hydralazine Other (See Comments)    Patient states it causes Tachycardia  . Lipitor [Atorvastatin] Other (See Comments)    Nose bleed    Social History:   reports that he quit smoking about 8 years ago. His smoking use included Cigarettes. He started smoking about 27 years ago. He has a 10 pack-year smoking history. He has never used smokeless tobacco. He reports that he does not drink alcohol or use illicit drugs.  Family History: Family History  Problem Relation Age of Onset  . Colon cancer Neg Hx   . Inflammatory bowel disease Neg Hx   . Liver disease Neg Hx   . Stroke Father   . Heart attack Father      Physical Exam: Filed Vitals:   09/18/15 1915 09/18/15 2110 09/18/15 2139  BP: 145/116 139/110   Pulse: 123 83   Temp: 99.5 F (37.5 C)    TempSrc: Oral    Resp: 20 22   Height: 6' (1.829 m)    Weight: 141.976 kg (313 lb)    SpO2: 100% 95% 100%   Blood pressure 139/110, pulse 83, temperature 99.5 F (37.5 C), temperature source Oral, resp. rate 22, height 6' (1.829 m), weight 141.976 kg (313 lb), SpO2 100 %.  GEN:  Pleasant patient lying in the stretcher in no acute distress; cooperative with exam. PSYCH:  alert and oriented x4; does not appear anxious or  depressed; affect is appropriate. HEENT: Mucous membranes pink and anicteric; PERRLA; EOM intact; no cervical lymphadenopathy nor thyromegaly or carotid bruit; no JVD; There were no stridor. Neck is very supple. Breasts:: Not examined CHEST WALL: No tenderness CHEST: Normal respiration, minimal wheezing.  HEART: Regular rate and rhythm.  There are no murmur, rub, or gallops.   BACK: No kyphosis or scoliosis; no CVA tenderness ABDOMEN: soft and non-tender; no masses, no organomegaly, normal abdominal bowel sounds; no pannus; no intertriginous candida. There is no rebound and no distention. Rectal Exam: Not done EXTREMITIES: No bone or joint deformity; age-appropriate arthropathy of the hands and knees; no ulcerations.  There is no calf tenderness. 1+ pedal edema  Genitalia: not examined PULSES: 2+ and symmetric SKIN: Normal hydration no rash or ulceration CNS: Cranial nerves 2-12 grossly intact no focal lateralizing neurologic deficit.  Speech is fluent; uvula elevated with phonation, facial symmetry and tongue midline. DTR are normal bilaterally, cerebella exam is intact, barbinski is negative and strengths are  equaled bilaterally.  No sensory loss.   Labs on Admission:  Basic Metabolic Panel:  Recent Labs Lab 09/12/15 0535 09/13/15 0433 09/14/15 0702 09/15/15 0711 09/18/15 2020  NA 143 144 145 145 137  K 3.2* 3.3* 3.7 3.6 3.4*  CL 109 108 111 111 104  CO2 GLUCOSE 92 82 90 93 98  BUN 23* 24* 24* 21* 21*  CREATININE 1.18 1.22 1.27* 1.24 1.17  CALCIUM 8.3* 8.5* 8.5* 8.3* 8.0*   Liver Function Tests:  Recent Labs Lab 09/18/15 2020  AST 32  ALT 25  ALKPHOS 63  BILITOT 1.3*  PROT 6.3*  ALBUMIN 3.4*   CBC:  Recent Labs Lab 09/14/15 0702 09/18/15 2020  WBC 3.7* 2.9*  NEUTROABS  --  1.6*  HGB 11.6* 11.8*  HCT 35.9* 36.2*  MCV 87.8 87.2  PLT 257 236   Cardiac Enzymes:  Recent Labs Lab 09/18/15 2020  TROPONINI 0.05*    CBG:  Recent Labs Lab  09/13/15 1721  GLUCAP 104*     Radiological Exams on Admission: Dg Chest 2 View  09/18/2015  CLINICAL DATA:  Shortness of Breath EXAM: CHEST  2 VIEW COMPARISON:  September 15, 2015 FINDINGS: There is no edema or consolidation. Heart is enlarged with pulmonary vascularity within normal limits, stable. No adenopathy. No bone lesions. IMPRESSION: Stable cardiac enlargement.  No edema or consolidation. Electronically Signed   By: Bretta Bang III M.D.   On: 09/18/2015 20:14    EKG: Independently reviewed.    Assessment/Plan 1. Acute on chronic CHF. Mild volume overload. Will start him on Bumex  BID, follow Cr and K carefully.  I do think there is a component of anxiety and asthma as well.  Will give Zopenex nebs, and evaluate him for home oxygen prior to discharge, but he doesn't require steroid at this time.  2. Elevated troponin. Likely demand ischemia in the setting of acute heart failure. Will recheck serial troponins.  3. GERD. Continue medications 4. HLD. Continue statin 5. Essential HTN. Continue home medications.   Code Status: Full   Houston Siren MD. FACP [facility] Pager [phone] 7pm to 7am.  09/18/2015, 9:44 PM  By signing my name below, I, Zadie Cleverly, attest that this documentation has been prepared under the direction and in the presence of Houston Siren, MD. Electronically signed: Zadie Cleverly, Scribe.  09/18/2015 10:15pm   I, Dr. Houston Siren, personally performed the services described in this documentation. All medical record entries made by the scribe were at my discretion and in my presence. Houston Siren, MD 09/18/2015

## 2015-09-18 NOTE — ED Provider Notes (Signed)
CSN: [id number]     Arrival date & time 09/18/15  1907 History   First MD Initiated Contact with Patient 09/18/15 1919     Chief Complaint  Patient presents with  . Shortness of Breath     HPI Pt was seen at 1925. Per pt and his family, c/o gradual onset and worsening of persistent SOB and pedal edema since hospital discharged 3 days ago for dx CHF. Pt endorses compliance with his meds since discharge. Denies fevers, no cough, no CP/palpitations, no abd pain, no N/V/D, no back pain.    Past Medical History  Diagnosis Date  . Hypertensive heart disease     Hypertensive heart disease with prior episodes of CHF  . Hyperlipidemia   . Hypertension   . Gastroesophageal reflux disease   . Osteoarthritis   . Peptic ulcer disease   . Morbid obesity (HCC)     /sleep apnea  . Allergic rhinitis     plus h/o asthma  . Tobacco abuse, in remission     Cigarettes discontinued in 2010  . CHF (congestive heart failure) (HCC)   . A-fib (HCC)     2015  . Asthma   . Noncompliance    Past Surgical History  Procedure Laterality Date  . None    . Cardiac catheterization N/A 06/14/2015    Procedure: Right/Left Heart Cath and Coronary Angiography;  Surgeon: Runell Gess, MD;  Location: [facility] INVASIVE CV LAB;  Service: Cardiovascular;  Laterality: N/A;   Family History  Problem Relation Age of Onset  . Colon cancer Neg Hx   . Inflammatory bowel disease Neg Hx   . Liver disease Neg Hx   . Stroke Father   . Heart attack Father    Social History  Substance Use Topics  . Smoking status: Former Smoker -- 0.50 packs/day for 20 years    Types: Cigarettes    Start date: 04/26/1988    Quit date: 06/23/2007  . Smokeless tobacco: Never Used     Comment: 1 ppd former smoker  . Alcohol Use: No     Comment: no etoh since 2009    Review of Systems ROS: Statement: All systems negative except as marked or noted in the HPI; Constitutional: Negative for fever and chills. ; ; Eyes: Negative for eye  pain, redness and discharge. ; ; ENMT: Negative for ear pain, hoarseness, nasal congestion, sinus pressure and sore throat. ; ; Cardiovascular: Negative for chest pain, palpitations, diaphoresis, +dyspnea and peripheral edema. ; ; Respiratory: Negative for cough, wheezing and stridor. ; ; Gastrointestinal: Negative for nausea, vomiting, diarrhea, abdominal pain, blood in stool, hematemesis, jaundice and rectal bleeding. . ; ; Genitourinary: Negative for dysuria, flank pain and hematuria. ; ; Musculoskeletal: Negative for back pain and neck pain. Negative for swelling and trauma.; ; Skin: Negative for pruritus, rash, abrasions, blisters, bruising and skin lesion.; ; Neuro: Negative for headache, lightheadedness and neck stiffness. Negative for weakness, altered level of consciousness , altered mental status, extremity weakness, paresthesias, involuntary movement, seizure and syncope.      Allergies  Bee venom; Aspirin; Banana; Diltiazem; Hydralazine; and Lipitor  Home Medications   Prior to Admission medications   Medication Sig Start Date End Date Taking? Authorizing Provider  atorvastatin (LIPITOR) 80 MG tablet Take 1 tablet (80 mg total) by mouth daily at 6 PM. 09/15/15  Yes Estela Isaiah Blakes, MD  beclomethasone (QVAR) 80 MCG/ACT inhaler Inhale 2 puffs into the lungs daily.    Yes Historical  Provider, MD  furosemide (LASIX) 40 MG tablet Take 1 tablet (40 mg total) by mouth daily. 09/15/15  Yes Henderson Cloud, MD  losartan (COZAAR) 100 MG tablet Take 1 tablet (100 mg total) by mouth daily. 09/15/15  Yes Henderson Cloud, MD  metoprolol succinate (TOPROL-XL) 100 MG 24 hr tablet Take 1 tablet (100 mg total) by mouth daily. Take with or immediately following a meal. 09/15/15  Yes Estela Isaiah Blakes, MD  potassium chloride SA (K-DUR,KLOR-CON) 20 MEQ tablet Take 1 tablet (20 mEq total) by mouth 2 (two) times daily. 09/04/15  Yes Devoria Albe, MD  rivaroxaban (XARELTO) 20 MG  TABS tablet Take 1 tablet (20 mg total) by mouth daily with supper. Patient taking differently: Take 20 mg by mouth daily.  09/15/15  Yes Henderson Cloud, MD  aspirin EC 81 MG EC tablet Take 1 tablet (81 mg total) by mouth daily. Patient not taking: Reported on 09/18/2015 06/17/15   [name], MD   BP 145/116 mmHg  Pulse 123  Temp(Src) 99.5 F (37.5 C) (Oral)  Resp 20  Ht 6' (1.829 m)  Wt 313 lb (141.976 kg)  BMI 42.44 kg/m2  SpO2 100%   Patient Vitals for the past 24 hrs:  BP Temp Temp src Pulse Resp SpO2 Height Weight  09/18/15 2200 - 100.6 F (38.1 C) Oral - 24 100 % - -  09/18/15 2139 - - - - - 100 % - -  09/18/15 2110 (!) 139/110 mmHg - - 83 22 95 % - -  09/18/15 1915 (!) 145/116 mmHg 99.5 F (37.5 C) Oral (!) 123 20 100 % 6' (1.829 m) (!) 313 lb (141.976 kg)      Physical Exam 1930: Physical examination:  Nursing notes reviewed; Vital signs and O2 SAT reviewed;  Constitutional: Well developed, Well nourished, Well hydrated, In no acute distress; Head:  Normocephalic, atraumatic; Eyes: EOMI, PERRL, No scleral icterus; ENMT: Mouth and pharynx normal, Mucous membranes moist; Neck: Supple, Full range of motion, No lymphadenopathy; Cardiovascular: Tachycardic rate and rhythm, No gallop; Respiratory: Breath sounds coarse & equal bilaterally, No wheezes.  Speaking full sentences with ease, Normal respiratory effort/excursion; Chest: Nontender, Movement normal; Abdomen: Soft, Nontender, Nondistended, Normal bowel sounds; Genitourinary: No CVA tenderness; Extremities: Pulses normal, No tenderness, +2 pedal edema.; Neuro: AA&Ox3, Major CN grossly intact.  Speech clear. No gross focal motor or sensory deficits in extremities.; Skin: Color normal, Warm, Dry.   ED Course  Procedures (including critical care time) Labs Review  Imaging Review  I have personally reviewed and evaluated these images and lab results as part of my medical decision-making.   EKG  Interpretation None      MDM  MDM Reviewed: previous chart, nursing note and vitals Reviewed previous: labs and ECG Interpretation: labs, ECG and x-ray   Results for orders placed or performed during the hospital encounter of 09/18/15  Comprehensive metabolic panel  Result Value Ref Range   Sodium 137 135 - 145 mmol/L   Potassium 3.4 (L) 3.5 - 5.1 mmol/L   Chloride 104 101 - 111 mmol/L   CO2 23 22 - 32 mmol/L   Glucose, Bld 98 65 - 99 mg/dL   BUN 21 (H) 6 - 20 mg/dL   Creatinine, Ser 1.61 0.61 - 1.24 mg/dL   Calcium 8.0 (L) 8.9 - 10.3 mg/dL   Total Protein 6.3 (L) 6.5 - 8.1 g/dL   Albumin 3.4 (L) 3.5 - 5.0 g/dL   AST  32 15 - 41 U/L   ALT 25 17 - 63 U/L   Alkaline Phosphatase 63 38 - 126 U/L   Total Bilirubin 1.3 (H) 0.3 - 1.2 mg/dL   GFR calc non Af Amer >60 >60 mL/min   GFR calc Af Amer >60 >60 mL/min   Anion gap 10 5 - 15  Brain natriuretic peptide  Result Value Ref Range   B Natriuretic Peptide 621.0 (H) 0.0 - 100.0 pg/mL  Troponin I  Result Value Ref Range   Troponin I 0.05 (H) <0.031 ng/mL  Lactic acid, plasma  Result Value Ref Range   Lactic Acid, Venous 1.0 0.5 - 2.0 mmol/L  CBC with Differential  Result Value Ref Range   WBC 2.9 (L) 4.0 - 10.5 K/uL   RBC 4.15 (L) 4.22 - 5.81 MIL/uL   Hemoglobin 11.8 (L) 13.0 - 17.0 g/dL   HCT 61.6 (L) 07.3 - 71.0 %   MCV 87.2 78.0 - 100.0 fL   MCH 28.4 26.0 - 34.0 pg   MCHC 32.6 30.0 - 36.0 g/dL   RDW 62.6 94.8 - 54.6 %   Platelets 236 150 - 400 K/uL   Neutrophils Relative % 55 %   Neutro Abs 1.6 (L) 1.7 - 7.7 K/uL   Lymphocytes Relative 35 %   Lymphs Abs 1.0 0.7 - 4.0 K/uL   Monocytes Relative 8 %   Monocytes Absolute 0.2 0.1 - 1.0 K/uL   Eosinophils Relative 1 %   Eosinophils Absolute 0.0 0.0 - 0.7 K/uL   Basophils Relative 1 %   Basophils Absolute 0.0 0.0 - 0.1 K/uL   Dg Chest 2 View 09/18/2015  CLINICAL DATA:  Shortness of Breath EXAM: CHEST  2 VIEW COMPARISON:  September 15, 2015 FINDINGS: There is no edema or  consolidation. Heart is enlarged with pulmonary vascularity within normal limits, stable. No adenopathy. No bone lesions. IMPRESSION: Stable cardiac enlargement.  No edema or consolidation. Electronically Signed   By: Bretta Bang III M.D.   On: 09/18/2015 20:14   Results for ALIKAI, PIGNATELLI (MRN [id number]) as of 09/18/2015 22:02  Ref. Range 09/04/2015 00:30 09/08/2015 23:41 09/10/2015 11:18 09/18/2015 20:20  B Natriuretic Peptide Latest Ref Range: 0.0-100.0 pg/mL 313.0 (H) 750.0 (H) 887.0 (H) 621.0 (H)   Results for ANTAWN, TOSO (MRN [id number]) as of 09/18/2015 22:02  Ref. Range 09/08/2015 23:41 09/10/2015 11:18 09/10/2015 18:31 09/18/2015 20:20  Troponin I Latest Ref Range: <0.031 ng/mL 0.04 (H) 0.05 (H) 0.04 (H) 0.05 (H)    2135:   Influenza test and BC x2 that were obtained 3 days ago are negative.  Pt moved from laying on stretcher to sitting to standing; O2 Sats immediately decreased to 80% and pt c/o increasing SOB. BNP elevated, but lower than previous and no overt CHF on CXR.  Pt does have hx asthma as well as CHF. Will dose IV lasix, IV solumedrol and short neb.   2215:  Will dose APAP for fever. T/C to [facility] Dr. Conley Rolls, case discussed, including:  HPI, pertinent PM/SHx, VS/PE, dx testing, ED course and treatment:  Agreeable to come to ED for evaluation.            Samuel Jester, DO 09/20/15 1610

## 2015-09-19 ENCOUNTER — Observation Stay (HOSPITAL_COMMUNITY): Payer: Medicare Other

## 2015-09-19 ENCOUNTER — Encounter (HOSPITAL_COMMUNITY): Payer: Self-pay | Admitting: Internal Medicine

## 2015-09-19 DIAGNOSIS — I482 Chronic atrial fibrillation: Secondary | ICD-10-CM | POA: Diagnosis not present

## 2015-09-19 DIAGNOSIS — I272 Other secondary pulmonary hypertension: Principal | ICD-10-CM

## 2015-09-19 DIAGNOSIS — I5033 Acute on chronic diastolic (congestive) heart failure: Secondary | ICD-10-CM

## 2015-09-19 DIAGNOSIS — R778 Other specified abnormalities of plasma proteins: Secondary | ICD-10-CM | POA: Diagnosis present

## 2015-09-19 DIAGNOSIS — R7989 Other specified abnormal findings of blood chemistry: Secondary | ICD-10-CM

## 2015-09-19 DIAGNOSIS — D72819 Decreased white blood cell count, unspecified: Secondary | ICD-10-CM

## 2015-09-19 DIAGNOSIS — E662 Morbid (severe) obesity with alveolar hypoventilation: Secondary | ICD-10-CM | POA: Diagnosis not present

## 2015-09-19 DIAGNOSIS — I10 Essential (primary) hypertension: Secondary | ICD-10-CM

## 2015-09-19 DIAGNOSIS — D649 Anemia, unspecified: Secondary | ICD-10-CM

## 2015-09-19 DIAGNOSIS — G4733 Obstructive sleep apnea (adult) (pediatric): Secondary | ICD-10-CM | POA: Diagnosis not present

## 2015-09-19 DIAGNOSIS — A499 Bacterial infection, unspecified: Secondary | ICD-10-CM | POA: Diagnosis not present

## 2015-09-19 LAB — CBC
HEMATOCRIT: 40.7 % (ref 39.0–52.0)
Hemoglobin: 13.5 g/dL (ref 13.0–17.0)
MCH: 28.5 pg (ref 26.0–34.0)
MCHC: 33.2 g/dL (ref 30.0–36.0)
MCV: 85.9 fL (ref 78.0–100.0)
PLATELETS: 243 10*3/uL (ref 150–400)
RBC: 4.74 MIL/uL (ref 4.22–5.81)
RDW: 14.4 % (ref 11.5–15.5)
WBC: 1.9 10*3/uL — ABNORMAL LOW (ref 4.0–10.5)

## 2015-09-19 LAB — D-DIMER, QUANTITATIVE (NOT AT ARMC): D DIMER QUANT: 0.93 ug{FEU}/mL — AB (ref 0.00–0.50)

## 2015-09-19 LAB — COMPREHENSIVE METABOLIC PANEL
ALBUMIN: 3.3 g/dL — AB (ref 3.5–5.0)
ALT: 26 U/L (ref 17–63)
AST: 31 U/L (ref 15–41)
Alkaline Phosphatase: 59 U/L (ref 38–126)
Anion gap: 9 (ref 5–15)
BUN: 18 mg/dL (ref 6–20)
CHLORIDE: 105 mmol/L (ref 101–111)
CO2: 26 mmol/L (ref 22–32)
CREATININE: 0.91 mg/dL (ref 0.61–1.24)
Calcium: 8.1 mg/dL — ABNORMAL LOW (ref 8.9–10.3)
GFR calc Af Amer: >60 mL/min (ref >60)
GFR calc non Af Amer: >60 mL/min (ref >60)
Glucose, Bld: 197 mg/dL — ABNORMAL HIGH (ref 65–99)
POTASSIUM: 3.3 mmol/L — AB (ref 3.5–5.1)
SODIUM: 140 mmol/L (ref 135–145)
Total Bilirubin: 1.6 mg/dL — ABNORMAL HIGH (ref 0.3–1.2)
Total Protein: 6.5 g/dL (ref 6.5–8.1)

## 2015-09-19 LAB — FOLATE: Folate: 18.3 ng/mL (ref >5.9)

## 2015-09-19 LAB — TROPONIN I
TROPONIN I: 0.04 ng/mL — AB (ref <0.031)
Troponin I: 0.05 ng/mL — ABNORMAL HIGH (ref <0.031)
Troponin I: <0.03 ng/mL (ref <0.031)

## 2015-09-19 LAB — IRON AND TIBC
Iron: 21 ug/dL — ABNORMAL LOW (ref 45–182)
Saturation Ratios: 6 % — ABNORMAL LOW (ref 17.9–39.5)
TIBC: 349 ug/dL (ref 250–450)
UIBC: 328 ug/dL

## 2015-09-19 LAB — TSH: TSH: 0.626 u[IU]/mL (ref 0.350–4.500)

## 2015-09-19 LAB — FERRITIN: FERRITIN: 61 ng/mL (ref 24–336)

## 2015-09-19 LAB — VITAMIN B12: Vitamin B-12: 338 pg/mL (ref 180–914)

## 2015-09-19 LAB — CBG MONITORING, ED: GLUCOSE-CAPILLARY: 126 mg/dL — AB (ref 65–99)

## 2015-09-19 MED ORDER — VANCOMYCIN HCL 10 G IV SOLR
1500.0000 mg | Freq: Two times a day (BID) | INTRAVENOUS | Status: DC
  Administered 2015-09-20 – 2015-09-21 (×4): 1500 mg via INTRAVENOUS
  Filled 2015-09-19 (×7): qty 1500

## 2015-09-19 MED ORDER — FLUTICASONE PROPIONATE HFA 44 MCG/ACT IN AERO
2.0000 | INHALATION_SPRAY | Freq: Two times a day (BID) | RESPIRATORY_TRACT | Status: DC
  Filled 2015-09-19: qty 10.6

## 2015-09-19 MED ORDER — BUDESONIDE 0.25 MG/2ML IN SUSP
0.2500 mg | Freq: Two times a day (BID) | RESPIRATORY_TRACT | Status: DC
  Administered 2015-09-19 – 2015-09-22 (×6): 0.25 mg via RESPIRATORY_TRACT
  Filled 2015-09-19 (×11): qty 2

## 2015-09-19 MED ORDER — VANCOMYCIN HCL IN DEXTROSE 1-5 GM/200ML-% IV SOLN
INTRAVENOUS | Status: AC
  Filled 2015-09-19: qty 400

## 2015-09-19 MED ORDER — VANCOMYCIN HCL IN DEXTROSE 1-5 GM/200ML-% IV SOLN
1000.0000 mg | Freq: Once | INTRAVENOUS | Status: AC
  Administered 2015-09-19: 1000 mg via INTRAVENOUS

## 2015-09-19 MED ORDER — POTASSIUM CHLORIDE CRYS ER 20 MEQ PO TBCR
30.0000 meq | EXTENDED_RELEASE_TABLET | Freq: Two times a day (BID) | ORAL | Status: DC
  Administered 2015-09-19 – 2015-09-22 (×6): 30 meq via ORAL
  Filled 2015-09-19 (×6): qty 1

## 2015-09-19 MED ORDER — DEXTROSE 5 % IV SOLN
INTRAVENOUS | Status: AC
  Filled 2015-09-19: qty 1

## 2015-09-19 MED ORDER — IOPAMIDOL (ISOVUE-370) INJECTION 76%
120.0000 mL | Freq: Once | INTRAVENOUS | Status: AC | PRN
  Administered 2015-09-19: 120 mL via INTRAVENOUS

## 2015-09-19 MED ORDER — POTASSIUM CHLORIDE CRYS ER 20 MEQ PO TBCR
20.0000 meq | EXTENDED_RELEASE_TABLET | Freq: Every day | ORAL | Status: AC
  Administered 2015-09-19: 20 meq via ORAL
  Filled 2015-09-19: qty 1

## 2015-09-19 MED ORDER — DEXTROSE 5 % IV SOLN
1.0000 g | Freq: Two times a day (BID) | INTRAVENOUS | Status: DC
  Administered 2015-09-19 – 2015-09-21 (×5): 1 g via INTRAVENOUS
  Filled 2015-09-19 (×8): qty 1

## 2015-09-19 NOTE — Progress Notes (Signed)
[facility] PROGRESS NOTE  [name] DGU:[id number] DOB: June 08, 1966 DOA: 09/18/2015 PCP: Rondel Baton    Code Status: Full code Family Communication: Discussed with patient; family not available Disposition Plan: Discharge when clinically appropriate   Consultants:  None  Procedures:  None  Antibiotics:  None  HPI/Subjective: Patient denies shortness of breath currently at rest. He denies chest pain. He says there is still a lot of swelling in his legs. He denies difficulty urinating.  Objective: Filed Vitals:   09/19/15 0715 09/19/15 0900  BP:  151/97  Pulse: 78 89  Temp:    Resp:    Temperature 98.0. Oxygen saturation 98% on room air.  Intake/Output Summary (Last 24 hours) at 09/19/15 0946 Last data filed at 09/19/15 0600  Gross per 24 hour  Intake      0 ml  Output   2200 ml  Net  -2200 ml   Filed Weights   09/18/15 1915  Weight: 141.976 kg (313 lb)    Exam:   General: Obese 50 year old African American man in no acute distress.  Cardiovascular: Irregular, irregular  Respiratory: Clear to auscultation bilaterally  Abdomen: Obese, positive bowel sounds, soft, nontender, nondistended.  Musculoskeletal/extremities: 2-3+ bilateral lower extremity edema, pitting  Neurologic: He is alert and oriented 3. Cranial nerves II through XII are intact.  Data Reviewed: Basic Metabolic Panel:  Recent Labs Lab 09/13/15 0433 09/14/15 0702 09/15/15 0711 09/18/15 2020  NA 144 145 145 137  K 3.3* 3.7 3.6 3.4*  CL 108 111 111 104  CO2 GLUCOSE 82 90 93 98  BUN 24* 24* 21* 21*  CREATININE 1.22 1.27* 1.24 1.17  CALCIUM 8.5* 8.5* 8.3* 8.0*   Liver Function Tests:  Recent Labs Lab 09/18/15 2020  AST 32  ALT 25  ALKPHOS 63  BILITOT 1.3*  PROT 6.3*  ALBUMIN 3.4*   No results for input(s): LIPASE, AMYLASE in the last 168 hours. No results for input(s): AMMONIA in the last 168 hours. CBC:  Recent Labs Lab  09/14/15 0702 09/18/15 2020  WBC 3.7* 2.9*  NEUTROABS  --  1.6*  HGB 11.6* 11.8*  HCT 35.9* 36.2*  MCV 87.8 87.2  PLT 257 236   Cardiac Enzymes:  Recent Labs Lab 09/18/15 2020 09/19/15 0025 09/19/15 0421  TROPONINI 0.05* 0.05* 0.04*   BNP (last 3 results)  Recent Labs  09/08/15 2341 09/10/15 1118 09/18/15 2020  BNP 750.0* 887.0* 621.0*    ProBNP (last 3 results) No results for input(s): PROBNP in the last 8760 hours.  CBG:  Recent Labs Lab 09/13/15 1721 09/19/15 0733  GLUCAP 104* 126*    Recent Results (from the past 240 hour(s))  Culture, blood (Routine X 2) w Reflex to ID Panel     Status: None (Preliminary result)   Collection Time: 09/15/15  7:11 AM  Result Value Ref Range Status   Specimen Description BLOOD RIGHT HAND  Final   Special Requests BOTTLES DRAWN AEROBIC AND ANAEROBIC 6CC EACH  Final   Culture NO GROWTH 3 DAYS  Final   Report Status PENDING  Incomplete  Culture, blood (Routine X 2) w Reflex to ID Panel     Status: None (Preliminary result)   Collection Time: 09/15/15  7:12 AM  Result Value Ref Range Status   Specimen Description BLOOD RIGHT ANTECUBITAL  Final   Special Requests BOTTLES DRAWN AEROBIC AND ANAEROBIC 6CC EACH  Final   Culture NO GROWTH 3 DAYS  Final  Report Status PENDING  Incomplete     Studies: Dg Chest 2 View  09/18/2015  CLINICAL DATA:  Shortness of Breath EXAM: CHEST  2 VIEW COMPARISON:  September 15, 2015 FINDINGS: There is no edema or consolidation. Heart is enlarged with pulmonary vascularity within normal limits, stable. No adenopathy. No bone lesions. IMPRESSION: Stable cardiac enlargement.  No edema or consolidation. Electronically Signed   By: Bretta Bang III M.D.   On: 09/18/2015 20:14    Scheduled Meds: . aspirin EC  81 mg Oral Daily  . atorvastatin  80 mg Oral q1800  . budesonide (PULMICORT) nebulizer solution  0.25 mg Nebulization BID  . bumetanide (BUMEX) IV  1 mg Intravenous Q12H  . losartan  100 mg  Oral Daily  . metoprolol succinate  100 mg Oral Daily  . potassium chloride SA  20 mEq Oral BID  . rivaroxaban  20 mg Oral Q supper  . sodium chloride flush  3 mL Intravenous Q12H   Continuous Infusions:  Assessment and plan: Principal Problem:   Acute on chronic diastolic CHF (congestive heart failure), NYHA class 1 (HCC) Active Problems:   Pulmonary hypertension (HCC)   A-fib (HCC)   Elevated troponin I level   Hyperlipidemia   OBESITY, MORBID   Anxiety state   Essential hypertension   Asthma   SOB (shortness of breath)   Obesity hypoventilation syndrome (HCC)   Leukopenia   Normocytic anemia   1. Acute on chronic dyspnea. Patient has no wheezes or crackles on pulmonary exam, although he does have chronic asthma. His chest x-ray revealed no edema or consolidation. Etiology of his subjective shortness of breath is unclear at this time, but could be secondary to physical deconditioning or pulmonary hypertension. -We'll continue his chronic inhaler for asthma although he has no obvious wheezing. -Will order a d-dimer and if it is positive, will order a CT angiogram of his chest.  Acute on chronic diastolic congestive heart failure. Echo 05/2015 revealed an EF of 55% an unknown diastolic function; presumed to be diastolic dysfunction. Patient presented with increase in bilateral leg swelling. He was recently hospitalized from 09/10/2015 through 09/15/2015 for decompensated diastolic heart failure. He was discharged on Lasix 40 mg daily. Patient denied missing any doses. -Bumex started by the admitting physician. It will be continued for now as he appears to be diuresing.  Severe pulmonary hypertension. Echo 12 2016 revealed pulmonary hypertension with a pressure of 59 mmHg. The pulmonary hypertension could be a source of his chronic dyspnea. Patient may benefit from an outpatient referral to cardiology.  Chronic atrial fibrillation. Currently stable on Xarelto, aspirin, and  metoprolol  Obstructive sleep apnea. Patient does not use CPAP which could also be contributing to his dyspnea.  Essential hypertension. Patient was restarted on losartan and Toprol-XL. Fair control. We'll continue to monitor.  Elevated troponin I. Patient's troponin I is marginally elevated ranging from 0.04-0.05. His EKG reveals minor T-wave changes, atrial fib, and PVCs. Elevation is likely from mild demand ischemia in the setting of heart failure. We'll continue to monitor.  Leukopenia and normocytic anemia. Patient's W BC was 3.7 on admission and his hemoglobin was 11.6. We'll order an anemia panel, TSH, and HIV for further evaluation.   Time spent: 35 minutes    [facility]  [facility] Pager [phone]. If 7PM-7AM, please contact night-coverage at [email or web address], password [facility] 09/19/2015, 9:46 AM

## 2015-09-19 NOTE — Plan of Care (Signed)
RN called with + blood culture results. In review of chart, these cultures were drawn on 09/15/15 during his last admission. No mention as to why in past notes or on d/c summary on 09/15/15. Called Dr. Conley Rolls of [facility] at [facility] and he is going to review and treat if needed. KJKG, NP [facility]

## 2015-09-19 NOTE — ED Notes (Signed)
Patient has been up ambulating in the room with no complaints.

## 2015-09-19 NOTE — Progress Notes (Signed)
Pharmacy Antibiotic Note  [name] is a 50 y.o. male admitted on 09/18/2015 with bacteremia.  Pharmacy has been consulted for Vancomycin dosing.  Plan: Obesity/Normalized CrCl Vancomycin dosing protocol will be initiated with an estimated normalized CrCl = 102 ml/min.  Vancomcyin 2gm Load Vancomycin 1500mg  IV every 12 hours.  Goal trough 15-20 mcg/mL.  Monitor labs, micro and vitals.   Height: 6' (182.9 cm) Weight: (!) 313 lb (141.976 kg) IBW/kg (Calculated) : 77.6  Temp (24hrs), Avg:98.7 F (37.1 C), Min:97.6 F (36.4 C), Max:100.6 F (38.1 C)   Recent Labs Lab 09/13/15 0433 09/14/15 0702 09/15/15 0711 09/18/15 2020 09/19/15 0918  WBC  --  3.7*  --  2.9* 1.9*  CREATININE 1.22 1.27* 1.24 1.17 0.91  LATICACIDVEN  --   --   --  1.0  --     Estimated Creatinine Clearance: 146.8 mL/min (by C-G formula based on Cr of 0.91).    Allergies  Allergen Reactions  . Bee Venom Shortness Of Breath and Swelling  . Aspirin Other (See Comments)    Chewable 81 mg tablets upsets patients stomach  . Banana Diarrhea  . Diltiazem Other (See Comments)    Makes heart beat fast  . Hydralazine Other (See Comments)    Patient states it causes Tachycardia  . Lipitor [Atorvastatin] Other (See Comments)    Nose bleed    Antimicrobials this admission: 3/20 Vancomcyin >>   Dose adjustments this admission:   Microbiology results: 3/26 BCx: Gram (+) Rods   Thank you for allowing pharmacy to be a part of this patient's care.  Mady Gemma 09/19/2015 9:30 PM

## 2015-09-19 NOTE — ED Notes (Signed)
Pt resting with eyes closed, resp even and non labored,  

## 2015-09-19 NOTE — Care Management Obs Status (Signed)
MEDICARE OBSERVATION STATUS NOTIFICATION   Patient Details  Name: [name] MRN: [id number] Date of Birth: 04/25/1966   Medicare Observation Status Notification Given:  Yes    [name], RN 09/19/2015, 4:14 PM

## 2015-09-20 DIAGNOSIS — R7989 Other specified abnormal findings of blood chemistry: Secondary | ICD-10-CM | POA: Diagnosis not present

## 2015-09-20 DIAGNOSIS — I248 Other forms of acute ischemic heart disease: Secondary | ICD-10-CM | POA: Diagnosis present

## 2015-09-20 DIAGNOSIS — Z87891 Personal history of nicotine dependence: Secondary | ICD-10-CM | POA: Diagnosis not present

## 2015-09-20 DIAGNOSIS — Z823 Family history of stroke: Secondary | ICD-10-CM | POA: Diagnosis not present

## 2015-09-20 DIAGNOSIS — B9689 Other specified bacterial agents as the cause of diseases classified elsewhere: Secondary | ICD-10-CM | POA: Diagnosis present

## 2015-09-20 DIAGNOSIS — Z7982 Long term (current) use of aspirin: Secondary | ICD-10-CM | POA: Diagnosis not present

## 2015-09-20 DIAGNOSIS — D649 Anemia, unspecified: Secondary | ICD-10-CM | POA: Diagnosis present

## 2015-09-20 DIAGNOSIS — G4733 Obstructive sleep apnea (adult) (pediatric): Secondary | ICD-10-CM | POA: Diagnosis present

## 2015-09-20 DIAGNOSIS — R7881 Bacteremia: Secondary | ICD-10-CM | POA: Diagnosis present

## 2015-09-20 DIAGNOSIS — R0600 Dyspnea, unspecified: Secondary | ICD-10-CM | POA: Diagnosis present

## 2015-09-20 DIAGNOSIS — E785 Hyperlipidemia, unspecified: Secondary | ICD-10-CM | POA: Diagnosis present

## 2015-09-20 DIAGNOSIS — E662 Morbid (severe) obesity with alveolar hypoventilation: Secondary | ICD-10-CM | POA: Diagnosis present

## 2015-09-20 DIAGNOSIS — A499 Bacterial infection, unspecified: Secondary | ICD-10-CM

## 2015-09-20 DIAGNOSIS — K219 Gastro-esophageal reflux disease without esophagitis: Secondary | ICD-10-CM | POA: Diagnosis present

## 2015-09-20 DIAGNOSIS — Z8249 Family history of ischemic heart disease and other diseases of the circulatory system: Secondary | ICD-10-CM | POA: Diagnosis not present

## 2015-09-20 DIAGNOSIS — D72819 Decreased white blood cell count, unspecified: Secondary | ICD-10-CM | POA: Diagnosis present

## 2015-09-20 DIAGNOSIS — Z7901 Long term (current) use of anticoagulants: Secondary | ICD-10-CM | POA: Diagnosis not present

## 2015-09-20 DIAGNOSIS — F411 Generalized anxiety disorder: Secondary | ICD-10-CM | POA: Diagnosis present

## 2015-09-20 DIAGNOSIS — I5033 Acute on chronic diastolic (congestive) heart failure: Secondary | ICD-10-CM | POA: Diagnosis present

## 2015-09-20 DIAGNOSIS — I272 Other secondary pulmonary hypertension: Secondary | ICD-10-CM | POA: Diagnosis present

## 2015-09-20 DIAGNOSIS — I110 Hypertensive heart disease with heart failure: Secondary | ICD-10-CM | POA: Diagnosis present

## 2015-09-20 DIAGNOSIS — I482 Chronic atrial fibrillation: Secondary | ICD-10-CM | POA: Diagnosis present

## 2015-09-20 DIAGNOSIS — Z6841 Body Mass Index (BMI) 40.0 and over, adult: Secondary | ICD-10-CM | POA: Diagnosis not present

## 2015-09-20 DIAGNOSIS — Z9119 Patient's noncompliance with other medical treatment and regimen: Secondary | ICD-10-CM | POA: Diagnosis not present

## 2015-09-20 DIAGNOSIS — I10 Essential (primary) hypertension: Secondary | ICD-10-CM | POA: Diagnosis not present

## 2015-09-20 DIAGNOSIS — J45909 Unspecified asthma, uncomplicated: Secondary | ICD-10-CM | POA: Diagnosis present

## 2015-09-20 LAB — CBC
HCT: 39.9 % (ref 39.0–52.0)
HEMOGLOBIN: 12.9 g/dL — AB (ref 13.0–17.0)
MCH: 28.2 pg (ref 26.0–34.0)
MCHC: 32.3 g/dL (ref 30.0–36.0)
MCV: 87.1 fL (ref 78.0–100.0)
Platelets: 237 10*3/uL (ref 150–400)
RBC: 4.58 MIL/uL (ref 4.22–5.81)
RDW: 14.4 % (ref 11.5–15.5)
WBC: 3.5 10*3/uL — ABNORMAL LOW (ref 4.0–10.5)

## 2015-09-20 LAB — BASIC METABOLIC PANEL
Anion gap: 8 (ref 5–15)
BUN: 21 mg/dL — AB (ref 6–20)
CALCIUM: 8.2 mg/dL — AB (ref 8.9–10.3)
CHLORIDE: 106 mmol/L (ref 101–111)
CO2: 29 mmol/L (ref 22–32)
Creatinine, Ser: 0.9 mg/dL (ref 0.61–1.24)
GFR calc Af Amer: >60 mL/min (ref >60)
GFR calc non Af Amer: >60 mL/min (ref >60)
Glucose, Bld: 103 mg/dL — ABNORMAL HIGH (ref 65–99)
Potassium: 3.5 mmol/L (ref 3.5–5.1)
SODIUM: 143 mmol/L (ref 135–145)

## 2015-09-20 LAB — URINALYSIS, ROUTINE W REFLEX MICROSCOPIC
Bilirubin Urine: NEGATIVE
GLUCOSE, UA: NEGATIVE mg/dL
HGB URINE DIPSTICK: NEGATIVE
Ketones, ur: NEGATIVE mg/dL
Leukocytes, UA: NEGATIVE
Nitrite: NEGATIVE
PH: 6 (ref 5.0–8.0)
PROTEIN: NEGATIVE mg/dL
Specific Gravity, Urine: 1.015 (ref 1.005–1.030)

## 2015-09-20 LAB — HIV ANTIBODY (ROUTINE TESTING W REFLEX): HIV Screen 4th Generation wRfx: NONREACTIVE

## 2015-09-20 LAB — CULTURE, BLOOD (ROUTINE X 2): Culture: NO GROWTH

## 2015-09-20 MED ORDER — ADULT MULTIVITAMIN W/MINERALS CH
1.0000 | ORAL_TABLET | Freq: Every day | ORAL | Status: DC
  Administered 2015-09-21 – 2015-09-22 (×2): 1 via ORAL
  Filled 2015-09-20 (×2): qty 1

## 2015-09-20 NOTE — Progress Notes (Signed)
Patient started on CPAP , nasal first --patient experienced air blowing from mouth-- changed to full face he appears to tolerate well. Saturation 98 on 7cm auto titrate to 14.

## 2015-09-20 NOTE — Progress Notes (Addendum)
[facility] PROGRESS NOTE  [name] HWE:[id number] DOB: 05-15-1966 DOA: 09/18/2015 PCP: Rondel Baton    Code Status: Full code Family Communication: Discussed with patient's mother Disposition Plan: Discharge when clinically appropriate   Consultants:  None  Procedures:  None  Antibiotics:  None  HPI/Subjective: Patient complains of legs being swollen and he does not believe there going down fast enough. He questions the change in diuretic.  Objective: Filed Vitals:   09/20/15 0614 09/20/15 0908  BP: 129/98 123/94  Pulse: 62 78  Temp: 97.9 F (36.6 C) 97.8 F (36.6 C)  Resp: 20 20  Temperature 98.0. Oxygen saturation 99% on room air.  Intake/Output Summary (Last 24 hours) at 09/20/15 1737 Last data filed at 09/20/15 8938  Gross per 24 hour  Intake    293 ml  Output   1300 ml  Net  -1007 ml   Filed Weights   09/18/15 1915 09/20/15 0851  Weight: 141.976 kg (313 lb) 141.762 kg (312 lb 8.5 oz)    Exam:   General: Obese 50 year old African American man in no acute distress.  Cardiovascular: Irregular, irregularWith a soft systolic murmur  Respiratory: Clear to auscultation bilaterally  Abdomen: Obese, positive bowel sounds, soft, nontender, nondistended.  Musculoskeletal/extremities: 2-3+ bilateral lower extremity edema, pitting; Slight decrease from 09/19/15  Neurologic: He is alert and oriented 3. Cranial nerves II through XII are intact.  Data Reviewed: Basic Metabolic Panel:  Recent Labs Lab 09/14/15 0702 09/15/15 0711 09/18/15 2020 09/19/15 0918 09/20/15 0559  NA 145 145 137 140 143  K 3.7 3.6 3.4* 3.3* 3.5  CL 111 111 104 105 106  CO2 26 25 23 26 29   GLUCOSE 90 93 98 197* 103*  BUN 24* 21* 21* 18 21*  CREATININE 1.27* 1.24 1.17 0.91 0.90  CALCIUM 8.5* 8.3* 8.0* 8.1* 8.2*   Liver Function Tests:  Recent Labs Lab 09/18/15 2020 09/19/15 0918  AST 32 31  ALT 25 26  ALKPHOS 63 59  BILITOT 1.3* 1.6*   PROT 6.3* 6.5  ALBUMIN 3.4* 3.3*   No results for input(s): LIPASE, AMYLASE in the last 168 hours. No results for input(s): AMMONIA in the last 168 hours. CBC:  Recent Labs Lab 09/14/15 0702 09/18/15 2020 09/19/15 0918 09/20/15 0559  WBC 3.7* 2.9* 1.9* 3.5*  NEUTROABS  --  1.6*  --   --   HGB 11.6* 11.8* 13.5 12.9*  HCT 35.9* 36.2* 40.7 39.9  MCV 87.8 87.2 85.9 87.1  PLT 257 236 243 237   Cardiac Enzymes:  Recent Labs Lab 09/18/15 2020 09/19/15 0025 09/19/15 0421 09/19/15 1132  TROPONINI 0.05* 0.05* 0.04* <0.03   BNP (last 3 results)  Recent Labs  09/08/15 2341 09/10/15 1118 09/18/15 2020  BNP 750.0* 887.0* 621.0*    ProBNP (last 3 results) No results for input(s): PROBNP in the last 8760 hours.  CBG:  Recent Labs Lab 09/19/15 0733  GLUCAP 126*    Recent Results (from the past 240 hour(s))  Culture, blood (Routine X 2) w Reflex to ID Panel     Status: None (Preliminary result)   Collection Time: 09/15/15  7:11 AM  Result Value Ref Range Status   Specimen Description BLOOD RIGHT HAND  Final   Special Requests BOTTLES DRAWN AEROBIC AND ANAEROBIC [facility] EACH  Final   Culture  Setup Time   Final    GRAM POSITIVE RODS RECOVERED FROM THE ANAEROBIC BOTTLE. Gram Stain Report Called to,Read Back By and Verified With:  THOMAS,K.,RN AT 1935 ON 09/19/2015 BY BAUGHAM,M. Performed at [facility]    Culture   Final    CULTURE REINCUBATED FOR BETTER GROWTH Performed at [facility]    Report Status PENDING  Incomplete  Culture, blood (Routine X 2) w Reflex to ID Panel     Status: None   Collection Time: 09/15/15  7:12 AM  Result Value Ref Range Status   Specimen Description BLOOD RIGHT ANTECUBITAL  Final   Special Requests BOTTLES DRAWN AEROBIC AND ANAEROBIC [facility] EACH  Final   Culture NO GROWTH 5 DAYS  Final   Report Status 09/20/2015 FINAL  Final  Culture, blood (Routine X 2) w Reflex to ID Panel     Status: None (Preliminary result)    Collection Time: 09/19/15  9:57 PM  Result Value Ref Range Status   Specimen Description BLOOD RIGHT ARM  Final   Special Requests BOTTLES DRAWN AEROBIC AND ANAEROBIC 6CC EACH  Final   Culture NO GROWTH < 12 HOURS  Final   Report Status PENDING  Incomplete  Culture, blood (Routine X 2) w Reflex to ID Panel     Status: None (Preliminary result)   Collection Time: 09/19/15 10:01 PM  Result Value Ref Range Status   Specimen Description BLOOD RIGHT ARM  Final   Special Requests BOTTLES DRAWN AEROBIC AND ANAEROBIC 6CC  Final   Culture NO GROWTH < 12 HOURS  Final   Report Status PENDING  Incomplete     Studies: Dg Chest 2 View  09/18/2015  CLINICAL DATA:  Shortness of Breath EXAM: CHEST  2 VIEW COMPARISON:  September 15, 2015 FINDINGS: There is no edema or consolidation. Heart is enlarged with pulmonary vascularity within normal limits, stable. No adenopathy. No bone lesions. IMPRESSION: Stable cardiac enlargement.  No edema or consolidation. Electronically Signed   By: Bretta Bang III M.D.   On: 09/18/2015 20:14   Ct Angio Chest Pe W/cm &/or Wo Cm  09/19/2015  CLINICAL DATA:  Shortness of breath. Lower leg swelling and light headed for 4 days. Initial encounter. EXAM: CT ANGIOGRAPHY CHEST WITH CONTRAST TECHNIQUE: Multidetector CT imaging of the chest was performed using the standard protocol during bolus administration of intravenous contrast. Multiplanar CT image reconstructions and MIPs were obtained to evaluate the vascular anatomy. CONTRAST:  120 ml Isovue 370. COMPARISON:  PA lateral chest 09/18/2015. FINDINGS: No pulmonary embolus is identified. There is marked cardiomegaly. No pleural or pericardial effusion. No axillary, hilar or mediastinal lymphadenopathy. The lungs demonstrate minimal dependent atelectasis. 2-3 small calcified granulomata are seen in the right lower lobe and right middle lobe. There is partial visualization of a cyst in the upper pole the right kidney. Imaged upper  abdomen is otherwise unremarkable. Schmorl's node is noted the superior endplate of a lower thoracic vertebral body. No worrisome bony lesion is seen. Review of the MIP images confirms the above findings. IMPRESSION: Negative for pulmonary embolus or acute disease. Marked cardiomegaly. Electronically Signed   By: Drusilla Kanner M.D.   On: 09/19/2015 18:44    Scheduled Meds: . aspirin EC  81 mg Oral Daily  . atorvastatin  80 mg Oral q1800  . budesonide (PULMICORT) nebulizer solution  0.25 mg Nebulization BID  . bumetanide (BUMEX) IV  1 mg Intravenous Q12H  . ceFEPime (MAXIPIME) IV  1 g Intravenous Q12H  . losartan  100 mg Oral Daily  . metoprolol succinate  100 mg Oral Daily  . potassium chloride SA  30 mEq Oral  BID  . rivaroxaban  20 mg Oral Q supper  . sodium chloride flush  3 mL Intravenous Q12H  . vancomycin  1,500 mg Intravenous Q12H   Continuous Infusions:    Assessment and plan: Principal Problem:   Acute on chronic diastolic CHF (congestive heart failure), NYHA class 1 (HCC) Active Problems:   Obesity hypoventilation syndrome (HCC)   Pulmonary hypertension (HCC)   OSA (obstructive sleep apnea)   A-fib (HCC)   Elevated troponin I level   Gram-positive bacteremia   OBESITY, MORBID   Anxiety state   Essential hypertension   Leukopenia   Normocytic anemia   1. Acute on chronic dyspnea. Patient has no wheezes or crackles on pulmonary exam, although he does have chronic asthma. His chest x-ray revealed no edema or consolidation. Etiology of his subjective shortness of breath is unclear at this time, but could be secondary to physical deconditioning with obesity hypoventilation syndrome and pulmonary hypertension stemming from chronic untreated obstructive sleep apnea. -D-dimer was ordered and was elevated. CT angiogram of the chest revealed no evidence of ulnar embolus.  Obstructive sleep apnea/obesity hypoventilation syndrome. Patient was diagnosed with obstructive sleep  apnea years ago per his account. He reports not being able to afford the CPAP machine. I explained to him that his chronic shortness of breath is likely from pulmonary hypertension related to chronic untreated obstructive sleep apnea. I suggested a trial of CPAP tonight. He was hesitant, but did agree to try it.  -Discussed with RT.  Acute on chronic diastolic congestive heart failure/chronic lower extremity edema. Echo 05/2015 revealed an EF of 55% an unknown diastolic function; presumed to be diastolic dysfunction. Patient presented with increase in bilateral leg swelling. He was recently hospitalized from 09/10/2015 through 09/15/2015 for decompensated diastolic heart failure. He was discharged on Lasix 40 mg daily. Patient denied missing any doses. -Bumex started by the admitting physician. It will be continued for now as he appears to be diuresing--4.6 L so far.  -There has been a slight decrease in his lower extremity edema despite the patient believing it's not helping. -Of note, some of the lower extremity edema could be from lymphedema or chronic venous insufficiency.  Severe pulmonary hypertension. Echo 12 2016 revealed pulmonary hypertension with a pressure of 59 mmHg. The pulmonary hypertension could be a source of his chronic dyspnea which was likely caused by untreated obstructive sleep apnea.  Chronic atrial fibrillation. Currently stable on Xarelto, aspirin, and metoprolol  Essential hypertension. Patient was restarted on losartan and Toprol-XL. Fair-good control. We'll continue to monitor.  Elevated troponin I. Patient's troponin I is marginally elevated ranging from 0.04-0.05. His EKG reveals minor T-wave changes, atrial fib, and PVCs. Elevation was likely from mild demand ischemia in the setting of heart failure. We'll continue to monitor. -Troponin I did normalize.  Leukopenia and normocytic anemia. Patient's WBC was 3.7 on admission and his hemoglobin was 11.6. Anemia panel  revealed a modestly low total iron of 21, ferritin of 61, folate of 18.3, and vitamin B12 of 338. HIV was nonreactive. TSH within normal limits. -We'll continue to monitor. Will start MVI once daily.  Gram-positive rod bacteremia. From the previous hospitalization, on 09/15/15, blood cultures are ordered. Lab notified the hospitalist service with the results which are positive for gram-positive rods; identification pending. Cefepime and vancomycin were started empirically. -Patient is afebrile and without an elevated white blood cell count. The source does not appear to be obvious at this point. -CT angiogram revealed no pneumonia. Will order  urinalysis. Repeat blood cultures ordered on 09/19/15.    Time spent: 35 minutes    [facility]  [facility] Pager [phone]. If 7PM-7AM, please contact night-coverage at [email or web address], password [facility] 09/20/2015, 5:37 PM  LOS: 0 days

## 2015-09-20 NOTE — Progress Notes (Signed)
Patient ran a 6 beat run of V tach, on call MD notified via text page. Patient nonsymptomatic and vitals stable.  Will continue to monitor.

## 2015-09-21 DIAGNOSIS — R7989 Other specified abnormal findings of blood chemistry: Secondary | ICD-10-CM

## 2015-09-21 LAB — BASIC METABOLIC PANEL
Anion gap: 7 (ref 5–15)
BUN: 22 mg/dL — ABNORMAL HIGH (ref 6–20)
CO2: 26 mmol/L (ref 22–32)
Calcium: 7.9 mg/dL — ABNORMAL LOW (ref 8.9–10.3)
Chloride: 109 mmol/L (ref 101–111)
Creatinine, Ser: 0.92 mg/dL (ref 0.61–1.24)
GFR calc Af Amer: >60 mL/min (ref >60)
GFR calc non Af Amer: >60 mL/min (ref >60)
Glucose, Bld: 80 mg/dL (ref 65–99)
Potassium: 3.7 mmol/L (ref 3.5–5.1)
Sodium: 142 mmol/L (ref 135–145)

## 2015-09-21 NOTE — Progress Notes (Signed)
[facility] PROGRESS NOTE  [name] ZOX:[id number] DOB: 1965-07-09 DOA: 09/18/2015 PCP: Rondel Baton    Code Status: Full code Family Communication: Discussed with patient's mother on 09/20/15 Disposition Plan: Discharge when clinically appropriate, possibly in the next 24-48 hours.   Consultants:  None  Procedures:  None  Antibiotics:  None  HPI/Subjective: Patient says the swelling in his legs is starting to go down. He is less short of breath.  Objective: Filed Vitals:   09/21/15 0457 09/21/15 1117  BP: 106/57 120/91  Pulse: 97 95  Temp: 98.4 F (36.9 C)   Resp: 20   Temperature 98.4. Oxygen saturation 96% on room air.  Intake/Output Summary (Last 24 hours) at 09/21/15 1318 Last data filed at 09/21/15 1124  Gross per 24 hour  Intake      3 ml  Output   1050 ml  Net  -1047 ml   Filed Weights   09/18/15 1915 09/20/15 0851  Weight: 141.976 kg (313 lb) 141.762 kg (312 lb 8.5 oz)    Exam:   General: Obese 50 year old African American man in no acute distress.  Cardiovascular: Irregular, irregularWith a soft systolic murmur  Respiratory: Clear to auscultation bilaterally  Abdomen: Obese, positive bowel sounds, soft, nontender, nondistended.  Musculoskeletal/extremities: 2+ bilateral lower extremity edema, pitting; Slight decrease from 09/19/15  Neurologic: He is alert and oriented 3. Cranial nerves II through XII are intact.  Data Reviewed: Basic Metabolic Panel:  Recent Labs Lab 09/15/15 0711 09/18/15 2020 09/19/15 0918 09/20/15 0559 09/21/15 0552  NA 145 137 140 143 142  K 3.6 3.4* 3.3* 3.5 3.7  CL 111 104 105 106 109  CO2 25 23 26 29 26   GLUCOSE 93 98 197* 103* 80  BUN 21* 21* 18 21* 22*  CREATININE 1.24 1.17 0.91 0.90 0.92  CALCIUM 8.3* 8.0* 8.1* 8.2* 7.9*   Liver Function Tests:  Recent Labs Lab 09/18/15 2020 09/19/15 0918  AST 32 31  ALT 25 26  ALKPHOS 63 59  BILITOT 1.3* 1.6*  PROT 6.3* 6.5   ALBUMIN 3.4* 3.3*   No results for input(s): LIPASE, AMYLASE in the last 168 hours. No results for input(s): AMMONIA in the last 168 hours. CBC:  Recent Labs Lab 09/18/15 2020 09/19/15 0918 09/20/15 0559  WBC 2.9* 1.9* 3.5*  NEUTROABS 1.6*  --   --   HGB 11.8* 13.5 12.9*  HCT 36.2* 40.7 39.9  MCV 87.2 85.9 87.1  PLT 236 243 237   Cardiac Enzymes:  Recent Labs Lab 09/18/15 2020 09/19/15 0025 09/19/15 0421 09/19/15 1132  TROPONINI 0.05* 0.05* 0.04* <0.03   BNP (last 3 results)  Recent Labs  09/08/15 2341 09/10/15 1118 09/18/15 2020  BNP 750.0* 887.0* 621.0*    ProBNP (last 3 results) No results for input(s): PROBNP in the last 8760 hours.  CBG:  Recent Labs Lab 09/19/15 0733  GLUCAP 126*    Recent Results (from the past 240 hour(s))  Culture, blood (Routine X 2) w Reflex to ID Panel     Status: None (Preliminary result)   Collection Time: 09/15/15  7:11 AM  Result Value Ref Range Status   Specimen Description BLOOD RIGHT HAND  Final   Special Requests BOTTLES DRAWN AEROBIC AND ANAEROBIC [facility] EACH  Final   Culture  Setup Time   Final    GRAM POSITIVE RODS RECOVERED FROM THE ANAEROBIC BOTTLE. Gram Stain Report Called to,Read Back By and Verified With: THOMAS,K.,RN AT 1935 ON 09/19/2015 BY BAUGHAM,M.  Performed at [facility]    Culture   Final    CULTURE REINCUBATED FOR BETTER GROWTH Performed at [facility]    Report Status PENDING  Incomplete  Culture, blood (Routine X 2) w Reflex to ID Panel     Status: None   Collection Time: 09/15/15  7:12 AM  Result Value Ref Range Status   Specimen Description BLOOD RIGHT ANTECUBITAL  Final   Special Requests BOTTLES DRAWN AEROBIC AND ANAEROBIC 6CC EACH  Final   Culture NO GROWTH 5 DAYS  Final   Report Status 09/20/2015 FINAL  Final  Culture, blood (Routine X 2) w Reflex to ID Panel     Status: None (Preliminary result)   Collection Time: 09/19/15  9:57 PM  Result Value Ref Range Status    Specimen Description BLOOD RIGHT ARM  Final   Special Requests BOTTLES DRAWN AEROBIC AND ANAEROBIC 6CC EACH  Final   Culture NO GROWTH < 12 HOURS  Final   Report Status PENDING  Incomplete  Culture, blood (Routine X 2) w Reflex to ID Panel     Status: None (Preliminary result)   Collection Time: 09/19/15 10:01 PM  Result Value Ref Range Status   Specimen Description BLOOD RIGHT ARM  Final   Special Requests BOTTLES DRAWN AEROBIC AND ANAEROBIC 6CC  Final   Culture NO GROWTH < 12 HOURS  Final   Report Status PENDING  Incomplete     Studies: Ct Angio Chest Pe W/cm &/or Wo Cm  09/19/2015  CLINICAL DATA:  Shortness of breath. Lower leg swelling and light headed for 4 days. Initial encounter. EXAM: CT ANGIOGRAPHY CHEST WITH CONTRAST TECHNIQUE: Multidetector CT imaging of the chest was performed using the standard protocol during bolus administration of intravenous contrast. Multiplanar CT image reconstructions and MIPs were obtained to evaluate the vascular anatomy. CONTRAST:  120 ml Isovue 370. COMPARISON:  PA lateral chest 09/18/2015. FINDINGS: No pulmonary embolus is identified. There is marked cardiomegaly. No pleural or pericardial effusion. No axillary, hilar or mediastinal lymphadenopathy. The lungs demonstrate minimal dependent atelectasis. 2-3 small calcified granulomata are seen in the right lower lobe and right middle lobe. There is partial visualization of a cyst in the upper pole the right kidney. Imaged upper abdomen is otherwise unremarkable. Schmorl's node is noted the superior endplate of a lower thoracic vertebral body. No worrisome bony lesion is seen. Review of the MIP images confirms the above findings. IMPRESSION: Negative for pulmonary embolus or acute disease. Marked cardiomegaly. Electronically Signed   By: Drusilla Kanner M.D.   On: 09/19/2015 18:44    Scheduled Meds: . aspirin EC  81 mg Oral Daily  . atorvastatin  80 mg Oral q1800  . budesonide (PULMICORT) nebulizer  solution  0.25 mg Nebulization BID  . bumetanide (BUMEX) IV  1 mg Intravenous Q12H  . ceFEPime (MAXIPIME) IV  1 g Intravenous Q12H  . losartan  100 mg Oral Daily  . metoprolol succinate  100 mg Oral Daily  . multivitamin with minerals  1 tablet Oral Q breakfast  . potassium chloride SA  30 mEq Oral BID  . rivaroxaban  20 mg Oral Q supper  . sodium chloride flush  3 mL Intravenous Q12H  . vancomycin  1,500 mg Intravenous Q12H   Continuous Infusions:    Assessment and plan: Principal Problem:   Acute on chronic diastolic CHF (congestive heart failure), NYHA class 1 (HCC) Active Problems:   Obesity hypoventilation syndrome (HCC)   Pulmonary hypertension (HCC)  OSA (obstructive sleep apnea)   A-fib (HCC)   Elevated troponin I level   Gram-positive bacteremia   OBESITY, MORBID   Anxiety state   Essential hypertension   Leukopenia   Normocytic anemia   1. Acute on chronic dyspnea. Patient has no wheezes or crackles on pulmonary exam, although he does have chronic asthma. His chest x-ray revealed no edema or consolidation. Etiology of his subjective shortness of breath is unclear at this time, but could be secondary to physical deconditioning with obesity hypoventilation syndrome and pulmonary hypertension stemming from chronic untreated obstructive sleep apnea. -D-dimer was ordered and was elevated. CT angiogram of the chest revealed no evidence of Pulmonary embolus.  Obstructive sleep apnea/obesity hypoventilation syndrome. Patient was diagnosed with obstructive sleep apnea years ago per his account. He reports not being able to afford the CPAP machine. I explained to him that his chronic shortness of breath is likely from pulmonary hypertension related to chronic untreated obstructive sleep apnea. I suggested a trial of CPAP tonight. He was hesitant, but did agree to try it.  -Patient was tried on CPAP by the RT. He tolerated it for approximately 45 minutes before pulling it off. He  reported that the air was blowing "too hard".  Acute on chronic diastolic congestive heart failure/chronic lower extremity edema. Echo 05/2015 revealed an EF of 55% an unknown diastolic function; presumed to be diastolic dysfunction. Patient presented with increase in bilateral leg swelling. He was recently hospitalized from 09/10/2015 through 09/15/2015 for decompensated diastolic heart failure. He was discharged on Lasix 40 mg daily. Patient denied missing any doses. -Bumex started by the admitting physician. It will be continued for now as he appears to be diuresing--5.7 L so far.  -There has been a slight decrease in his lower extremity edema. He will likely need another 24-48 hours of diuresis. -Of note, some of the lower extremity edema could be from lymphedema or chronic venous insufficiency.  Severe pulmonary hypertension. Echo 12 2016 revealed pulmonary hypertension with a pressure of 59 mmHg. The pulmonary hypertension could be a source of his chronic dyspnea which is likely secondary to untreated obstructive sleep apnea.  Chronic atrial fibrillation. Currently stable on Xarelto, aspirin, and metoprolol  Essential hypertension. Patient was restarted on losartan and Toprol-XL. Fair-good control. We'll continue to monitor.  Elevated troponin I. Patient's troponin I is marginally elevated ranging from 0.04-0.05. His EKG reveals minor T-wave changes, atrial fib, and PVCs. Elevation was likely from mild demand ischemia in the setting of heart failure. We'll continue to monitor. -Troponin I did normalize.  Leukopenia and normocytic anemia. Patient's WBC was 3.7 on admission and his hemoglobin was 11.6. Anemia panel revealed a modestly low total iron of 21, ferritin of 61, folate of 18.3, and vitamin B12 of 338. HIV was nonreactive. TSH within normal limits. -We'll continue to monitor. MVI started.  Gram-positive rod bacteremia. From the previous hospitalization, on 09/15/15, blood  cultures are ordered. Lab notified the hospitalist service with the results which are positive for gram-positive rods; identification pending. Cefepime and vancomycin were started empirically. -Patient is afebrile and without an elevated white blood cell count. The source does not appear to be obvious at this point. -CT angiogram revealed no pneumonia. Urinalysis ordered and was within normal limits. Repeat blood cultures ordered on 09/19/15. -We'll await ID of GPR before discontinuing antibiotics.    Time spent: 30 minutes    [facility]  [facility] Pager [phone]. If 7PM-7AM, please contact night-coverage at [email or web address], password [facility] 09/21/2015,  1:18 PM  LOS: 1 day

## 2015-09-21 NOTE — Progress Notes (Signed)
Patient wore CPAP for about 45 minutes before he pulled off.

## 2015-09-22 LAB — BASIC METABOLIC PANEL
Anion gap: 6 (ref 5–15)
BUN: 20 mg/dL (ref 6–20)
CALCIUM: 8.2 mg/dL — AB (ref 8.9–10.3)
CHLORIDE: 112 mmol/L — AB (ref 101–111)
CO2: 26 mmol/L (ref 22–32)
CREATININE: 0.96 mg/dL (ref 0.61–1.24)
GFR calc Af Amer: >60 mL/min (ref >60)
GFR calc non Af Amer: >60 mL/min (ref >60)
Glucose, Bld: 88 mg/dL (ref 65–99)
Potassium: 3.9 mmol/L (ref 3.5–5.1)
SODIUM: 144 mmol/L (ref 135–145)

## 2015-09-22 LAB — CULTURE, BLOOD (ROUTINE X 2)

## 2015-09-22 MED ORDER — POTASSIUM CHLORIDE CRYS ER 20 MEQ PO TBCR: 20.0000 meq | EXTENDED_RELEASE_TABLET | Freq: Two times a day (BID) | ORAL | Status: DC

## 2015-09-22 MED ORDER — ADULT MULTIVITAMIN W/MINERALS CH: 1.0000 | ORAL_TABLET | Freq: Every day | ORAL | Status: DC

## 2015-09-22 MED ORDER — FUROSEMIDE 40 MG PO TABS: 60.0000 mg | ORAL_TABLET | Freq: Two times a day (BID) | ORAL | Status: DC

## 2015-09-22 MED ORDER — FUROSEMIDE 10 MG/ML IJ SOLN
20.0000 mg | Freq: Once | INTRAMUSCULAR | Status: AC
  Administered 2015-09-22: 20 mg via INTRAVENOUS
  Filled 2015-09-22: qty 2

## 2015-09-22 NOTE — Discharge Summary (Signed)
Physician Discharge Summary  [name] ZOX:[id number] DOB: 1966-06-19 DOA: 09/18/2015  PCP: [name]  Admit date: 09/18/2015 Discharge date: 09/22/2015  Time spent: Greater than 30 minutes  Recommendations for Outpatient Follow-up:  1.  Recommend reassessment of the patient's serum potassium as he was discharged on a higher dose of Lasix.  2.     Discharge Diagnoses:   1. Acute on chronic dyspnea-Etiology multifactorial including pulmonary hypertension, untreated obstructive sleep apnea, and obesity hypoventilation syndrome.  2. Acute on chronic diastolic heart failure with  Acute on chronic lower extremity edema. -Patient diuresed nearly 7 L. -Weight reported as 313 pounds on admission and 298 pounds at the time of discharge.  3. Severe pulmonary hypertension per echo 05/2015 revealing 59 mmHg.  4. Obstructive sleep apnea. Patient did not tolerate CPAP during the hospitalization.  5. Chronic atrial fibrillation, on Xarelto.  6. Elevated troponin I; marginal.  7. Essential hypertension.  8. Leukopenia and normocytic anemia.  9. Gram-positive rod bacteremia caused by corynebacteria, nonpathogenic.  10. Obesity.  11. Chronic asthma with no exacerbation.   Discharge Condition:  Improved.  Diet recommendation:  Heart healthy.  Filed Weights   09/18/15 1915 09/20/15 0851 09/22/15 0500  Weight: 141.976 kg (313 lb) 141.762 kg (312 lb 8.5 oz) 135.04 kg (297 lb 11.4 oz)    History of present illness:  Patient is a 50 year old man with a history of chronic diastolic heart failure, chronic atrial fibrillation on Xarelto, obstructive sleep apnea-not on CPAP, and obesity, who presented to the ED on 09/18/2015 with a chief complaint of shortness of breath. He had just been hospitalized from 09/10/2015 through 09/15/2015 for acute on chronic diastolic heart failure for which he diuresed over 9 L.  In the ED, he was oxygenating 100% on nasal cannula oxygen. He was febrile  with a temperature 100.6. He was mildly tachycardic. His blood pressure was modestly elevated. His troponin I was minimally elevated at 0.05. His BNP was elevated at 621. His WBC was 2.9. His chest x-ray revealed no edema or consolidation. He was admitted for further evaluation and management.  Hospital Course:  1. Acute on chronic dyspnea. Patient had no wheezes or crackles on pulmonary exam. His chest x-ray revealed no edema or consolidation. Etiology of his subjective shortness of breath is probably multifactorial including physical deconditioning with obesity hypoventilation syndrome and pulmonary hypertension as result of chronic untreated obstructive sleep apnea. -D-dimer was ordered and was elevated. CT angiogram of the chest revealed no evidence of pulmonary embolus.  Obstructive sleep apnea/obesity hypoventilation syndrome. Patient was diagnosed with obstructive sleep apnea years ago per his account. He reported not being able to afford the CPAP machine. I explained to him that his chronic shortness of breath was likely from pulmonary hypertension related to chronic untreated obstructive sleep apnea. I suggested a trial of CPAP during the hospitalization. He was hesitant, but did agree to try it. Patient was placed on CPAP by the RT. He tolerated it for approximately 45 minutes before pulling it off. He reported that the air was blowing "too hard".  Acute on chronic diastolic congestive heart failure/chronic lower extremity edema. Echo 05/2015 revealed an EF of 55% an unknown diastolic function; presumed to be diastolic dysfunction. Patient presented with increase in bilateral leg swelling. He was recently hospitalized from 09/10/2015 through 09/15/2015 for decompensated diastolic heart failure. He was discharged on Lasix 40 mg once daily, but he stated his cardiologist increased it to twice daily.. Patient denied missing  any doses. -Bumex was started by the admitting physician. It was  continued. He diuresed approximately 7 L and his reported weight decreased approximately 15 pounds (? Accuracy). -There was a noticeable decrease in his lower extremity edema, but there was still residual edema. Some of the lower extremity edema could be from lymphedema or chronic venous insufficiency.  Severe pulmonary hypertension. Echo 05/2015 revealed pulmonary hypertension with a pressure of 59 mmHg. Pulmonary hypertension could be a source of his chronic dyspnea which is likely secondary to untreated obstructive sleep apnea.  Chronic atrial fibrillation.  Xarelto, aspirin, and metoprolol were continued. His A. Fib remained stable.  Essential hypertension. Patient was restarted on losartan and Toprol-XL.  His blood pressure was controlled.  Elevated troponin I. Patient's troponin I was marginally elevated ranging from 0.04-0.05. His EKG revealed minor T-wave changes, atrial fib, and PVCs. Elevation was likely from mild demand ischemia. -Troponin I did normalize.  Leukopenia and normocytic anemia. Patient's WBC was 3.7 on admission and his hemoglobin was 11.6. Anemia panel revealed a modestly low total iron of 21, ferritin of 61, folate of 18.3, and vitamin B12 of 338. HIV was nonreactive. TSH was within normal limits.  MVI started.  Gram-positive rod bacteremia. From the previous hospitalization, on 09/15/15, blood cultures are ordered. Lab notified the hospitalist service with the results which were positive for gram-positive rods. Cefepime and vancomycin were started empirically. -Patient was borderline febrile and without an elevated white blood cell count on admission. Urinalysis ordered for evaluation and was essentially negative. -CT angiogram revealed no pneumonia. Urinalysis ordered and was within normal limits. Repeat blood cultures ordered on 09/19/15 remained negative to date. -Blood culture grew out only Corynebacteria.  Antibiotics were discontinued at the time of  discharge.  Procedures:    CT angiogram of the chest was negative for PE or acute disease. Positive for market cardiomegaly.  Consultations:   none  Discharge Exam: Filed Vitals:   09/21/15 1430 09/22/15 0500  BP: 101/81 129/97  Pulse: 89 88  Temp: 97.8 F (36.6 C) 98.7 F (37.1 C)  Resp: 22 22   Oxygen saturation 93% on room air  General: Obese 50 year old African American man in no acute distress.  Cardiovascular: Irregular, irregular  Respiratory: Clear to auscultation bilaterally  Abdomen: Obese, positive bowel sounds, soft, nontender, nondistended.  Musculoskeletal/extremities: Edema down to trace to 1+ on the left and 1+ on the right lower extremity.    Discharge Instructions   Discharge Instructions    Diet - low sodium heart healthy    Complete by:  As directed      Discharge instructions    Complete by:  As directed   Follow-up with the cardiologist as scheduled. Follow a low sodium/salt diet.     Increase activity slowly    Complete by:  As directed           Current Discharge Medication List    START taking these medications   Details  Multiple Vitamin (MULTIVITAMIN WITH MINERALS) TABS tablet Take 1 tablet by mouth daily with breakfast.      CONTINUE these medications which have CHANGED   Details  furosemide (LASIX) 40 MG tablet Take 1.5 tablets (60 mg total) by mouth 2 (two) times daily. Qty: 90 tablet, Refills: 2    potassium chloride SA (K-DUR,KLOR-CON) 20 MEQ tablet Take 1 tablet (20 mEq total) by mouth 2 (two) times daily. Take with furosemide Qty: 60 tablet, Refills: 2      CONTINUE these medications which  have NOT CHANGED   Details  atorvastatin (LIPITOR) 80 MG tablet Take 1 tablet (80 mg total) by mouth daily at 6 PM. Qty: 30 tablet, Refills: 2    beclomethasone (QVAR) 80 MCG/ACT inhaler Inhale 2 puffs into the lungs daily.     losartan (COZAAR) 100 MG tablet Take 1 tablet (100 mg total) by mouth daily. Qty: 30 tablet,  Refills: 2    metoprolol succinate (TOPROL-XL) 100 MG 24 hr tablet Take 1 tablet (100 mg total) by mouth daily. Take with or immediately following a meal. Qty: 30 tablet, Refills: 2    rivaroxaban (XARELTO) 20 MG TABS tablet Take 1 tablet (20 mg total) by mouth daily with supper. Qty: 30 tablet, Refills: 2    aspirin EC 81 MG EC tablet Take 1 tablet (81 mg total) by mouth daily.       Allergies  Allergen Reactions  . Bee Venom Shortness Of Breath and Swelling  . Aspirin Other (See Comments)    Chewable 81 mg tablets upsets patients stomach  . Banana Diarrhea  . Diltiazem Other (See Comments)    Makes heart beat fast  . Hydralazine Other (See Comments)    Patient states it causes Tachycardia  . Lipitor [Atorvastatin] Other (See Comments)    Nose bleed   Follow-up Information    Follow up with [name], MD.   Specialty:  Cardiology   Why:  FOLLOW UP AS SCHEDULED   Contact information:   [address] [place] [address] [phone]        The results of significant diagnostics from this hospitalization (including imaging, microbiology, ancillary and laboratory) are listed below for reference.    Significant Diagnostic Studies: Dg Chest 2 View  09/18/2015  CLINICAL DATA:  Shortness of Breath EXAM: CHEST  2 VIEW COMPARISON:  September 15, 2015 FINDINGS: There is no edema or consolidation. Heart is enlarged with pulmonary vascularity within normal limits, stable. No adenopathy. No bone lesions. IMPRESSION: Stable cardiac enlargement.  No edema or consolidation. Electronically Signed   By: Bretta Bang III M.D.   On: 09/18/2015 20:14   Dg Chest 2 View  09/15/2015  CLINICAL DATA:  Elevated temperature, swelling in the legs EXAM: CHEST  2 VIEW COMPARISON:  09/10/2015 FINDINGS: Cardiomegaly. Mild interstitial prominence bilateral lower lobe suspicious for residual mild interstitial edema with improvement from prior exam. No segmental infiltrate. No pleural  effusion. IMPRESSION: Cardiomegaly. Mild interstitial prominence bilateral lower lobe suspicious for residual mild interstitial edema with improvement from prior exam. No segmental infiltrate. No pleural effusion. Electronically Signed   By: Natasha Mead M.D.   On: 09/15/2015 10:07   Dg Chest 2 View  09/10/2015  CLINICAL DATA:  Short of breath.  Lower extremity swelling. EXAM: CHEST  2 VIEW COMPARISON:  09/08/2015 FINDINGS: Cardiomegaly. Moderate pulmonary edema. No pneumothorax. No pleural effusion. IMPRESSION: Worsening CHF. Electronically Signed   By: Jolaine Click M.D.   On: 09/10/2015 12:01   Dg Chest 2 View  09/09/2015  CLINICAL DATA:  Hypotension, shortness of breath, swelling in the legs. EXAM: CHEST  2 VIEW COMPARISON:  09/04/2015 FINDINGS: Cardiac enlargement with mild vascular congestion. Mild interstitial edema. Appearance is similar to previous study. No blunting of costophrenic angles. No pneumothorax. Small amount of fluid in the fissures. Mediastinal contours appear intact. IMPRESSION: Cardiac enlargement with mild pulmonary vascular congestion and mild interstitial edema. Small amount of fluid in the fissures. Electronically Signed   By: Marisa Cyphers.D.  On: 09/09/2015 00:34   Dg Chest 2 View  09/04/2015  CLINICAL DATA:  Acute onset of shortness of breath and bilateral lower extremity swelling. Initial encounter. EXAM: CHEST  2 VIEW COMPARISON:  Chest radiograph performed 09/03/2015 FINDINGS: The lungs are well-aerated. Vascular congestion is noted. Bibasilar airspace opacities may reflect mild interstitial edema. There is no evidence of pleural effusion or pneumothorax. The heart is enlarged.  No acute osseous abnormalities are seen. IMPRESSION: Vascular congestion and cardiomegaly. Bibasilar airspace opacities may reflect mild interstitial edema. Electronically Signed   By: Roanna Raider M.D.   On: 09/04/2015 01:28   Dg Chest 2 View  09/03/2015  CLINICAL DATA:  Shortness of breath  for 1 week. History of asthma, atrial fibrillation, CHF, hypertension, hyperlipidemia, morbid obesity. EXAM: CHEST  2 VIEW COMPARISON:  Chest radiograph August 02, 2015 FINDINGS: The cardiac silhouette appears moderately enlarged, similar. Mediastinal silhouette is nonsuspicious. Pulmonary vascular congestion, without pleural effusion or focal consolidation. Improved aeration of RIGHT lung base. No pneumothorax. Large body habitus. Osseous structure nonsuspicious. IMPRESSION: Similar cardiomegaly and pulmonary vascular congestion. Electronically Signed   By: Awilda Metro M.D.   On: 09/03/2015 06:59   Ct Angio Chest Pe W/cm &/or Wo Cm  09/19/2015  CLINICAL DATA:  Shortness of breath. Lower leg swelling and light headed for 4 days. Initial encounter. EXAM: CT ANGIOGRAPHY CHEST WITH CONTRAST TECHNIQUE: Multidetector CT imaging of the chest was performed using the standard protocol during bolus administration of intravenous contrast. Multiplanar CT image reconstructions and MIPs were obtained to evaluate the vascular anatomy. CONTRAST:  120 ml Isovue 370. COMPARISON:  PA lateral chest 09/18/2015. FINDINGS: No pulmonary embolus is identified. There is marked cardiomegaly. No pleural or pericardial effusion. No axillary, hilar or mediastinal lymphadenopathy. The lungs demonstrate minimal dependent atelectasis. 2-3 small calcified granulomata are seen in the right lower lobe and right middle lobe. There is partial visualization of a cyst in the upper pole the right kidney. Imaged upper abdomen is otherwise unremarkable. Schmorl's node is noted the superior endplate of a lower thoracic vertebral body. No worrisome bony lesion is seen. Review of the MIP images confirms the above findings. IMPRESSION: Negative for pulmonary embolus or acute disease. Marked cardiomegaly. Electronically Signed   By: Drusilla Kanner M.D.   On: 09/19/2015 18:44    Microbiology: Recent Results (from the past 240 hour(s))  Culture,  blood (Routine X 2) w Reflex to ID Panel     Status: None   Collection Time: 09/15/15  7:11 AM  Result Value Ref Range Status   Specimen Description BLOOD RIGHT HAND  Final   Special Requests BOTTLES DRAWN AEROBIC AND ANAEROBIC [facility] EACH  Final   Culture  Setup Time   Final    GRAM POSITIVE RODS RECOVERED FROM THE ANAEROBIC BOTTLE. Gram Stain Report Called to,Read Back By and Verified With: THOMAS,K.,RN AT 1935 ON 09/19/2015 BY BAUGHAM,M. Performed at [facility]    Culture   Final    DIPHTHEROIDS(CORYNEBACTERIUM SPECIES) Standardized susceptibility testing for this organism is not available. Performed at [facility]    Report Status 09/22/2015 FINAL  Final  Culture, blood (Routine X 2) w Reflex to ID Panel     Status: None   Collection Time: 09/15/15  7:12 AM  Result Value Ref Range Status   Specimen Description BLOOD RIGHT ANTECUBITAL  Final   Special Requests BOTTLES DRAWN AEROBIC AND ANAEROBIC [facility] EACH  Final   Culture NO GROWTH 5 DAYS  Final  Report Status 09/20/2015 FINAL  Final  Culture, blood (Routine X 2) w Reflex to ID Panel     Status: None (Preliminary result)   Collection Time: 09/19/15  9:57 PM  Result Value Ref Range Status   Specimen Description BLOOD RIGHT ARM  Final   Special Requests BOTTLES DRAWN AEROBIC AND ANAEROBIC 6CC EACH  Final   Culture NO GROWTH 3 DAYS  Final   Report Status PENDING  Incomplete  Culture, blood (Routine X 2) w Reflex to ID Panel     Status: None (Preliminary result)   Collection Time: 09/19/15 10:01 PM  Result Value Ref Range Status   Specimen Description BLOOD RIGHT ARM  Final   Special Requests BOTTLES DRAWN AEROBIC AND ANAEROBIC 6CC  Final   Culture NO GROWTH 3 DAYS  Final   Report Status PENDING  Incomplete     Labs: Basic Metabolic Panel:  Recent Labs Lab 09/18/15 2020 09/19/15 0918 09/20/15 0559 09/21/15 0552 09/22/15 0545  NA 137 140 143 142 144  K 3.4* 3.3* 3.5 3.7 3.9  CL 104 105 106 109 112*  CO2  23 26 29 26 26   GLUCOSE 98 197* 103* 80 88  BUN 21* 18 21* 22* 20  CREATININE 1.17 0.91 0.90 0.92 0.96  CALCIUM 8.0* 8.1* 8.2* 7.9* 8.2*   Liver Function Tests:  Recent Labs Lab 09/18/15 2020 09/19/15 0918  AST 32 31  ALT 25 26  ALKPHOS 63 59  BILITOT 1.3* 1.6*  PROT 6.3* 6.5  ALBUMIN 3.4* 3.3*   No results for input(s): LIPASE, AMYLASE in the last 168 hours. No results for input(s): AMMONIA in the last 168 hours. CBC:  Recent Labs Lab 09/18/15 2020 09/19/15 0918 09/20/15 0559  WBC 2.9* 1.9* 3.5*  NEUTROABS 1.6*  --   --   HGB 11.8* 13.5 12.9*  HCT 36.2* 40.7 39.9  MCV 87.2 85.9 87.1  PLT 236 243 237   Cardiac Enzymes:  Recent Labs Lab 09/18/15 2020 09/19/15 0025 09/19/15 0421 09/19/15 1132  TROPONINI 0.05* 0.05* 0.04* <0.03   BNP: BNP (last 3 results)  Recent Labs  09/08/15 2341 09/10/15 1118 09/18/15 2020  BNP 750.0* 887.0* 621.0*    ProBNP (last 3 results) No results for input(s): PROBNP in the last 8760 hours.  CBG:  Recent Labs Lab 09/19/15 0733  GLUCAP 126*       Signed:  Angelee Bahr MD.  [facility] 09/22/2015, 11:16 AM

## 2015-09-23 LAB — URINE CULTURE

## 2015-09-24 ENCOUNTER — Encounter: Payer: Medicare Other | Admitting: Adult Health

## 2015-09-24 LAB — CULTURE, BLOOD (ROUTINE X 2)
CULTURE: NO GROWTH
CULTURE: NO GROWTH

## 2015-09-24 NOTE — Progress Notes (Signed)
ERROR

## 2015-09-25 ENCOUNTER — Encounter: Payer: Medicare Other | Admitting: Physician Assistant

## 2015-09-25 NOTE — Progress Notes (Signed)
No show

## 2015-09-26 ENCOUNTER — Encounter (HOSPITAL_COMMUNITY): Payer: Self-pay

## 2015-09-26 ENCOUNTER — Emergency Department (HOSPITAL_COMMUNITY)
Admission: EM | Admit: 2015-09-26 | Discharge: 2015-09-26 | Disposition: A | Discharge status: Home / Self Care | Payer: Medicare Other | Source: Home / Self Care | Attending: Emergency Medicine | Admitting: Emergency Medicine

## 2015-09-26 ENCOUNTER — Emergency Department (HOSPITAL_COMMUNITY): Payer: Medicare Other

## 2015-09-26 DIAGNOSIS — I5033 Acute on chronic diastolic (congestive) heart failure: Secondary | ICD-10-CM

## 2015-09-26 DIAGNOSIS — J45909 Unspecified asthma, uncomplicated: Secondary | ICD-10-CM | POA: Insufficient documentation

## 2015-09-26 DIAGNOSIS — I4891 Unspecified atrial fibrillation: Secondary | ICD-10-CM | POA: Insufficient documentation

## 2015-09-26 DIAGNOSIS — Z87891 Personal history of nicotine dependence: Secondary | ICD-10-CM | POA: Insufficient documentation

## 2015-09-26 DIAGNOSIS — E785 Hyperlipidemia, unspecified: Secondary | ICD-10-CM | POA: Insufficient documentation

## 2015-09-26 DIAGNOSIS — I110 Hypertensive heart disease with heart failure: Secondary | ICD-10-CM

## 2015-09-26 DIAGNOSIS — M1990 Unspecified osteoarthritis, unspecified site: Secondary | ICD-10-CM

## 2015-09-26 DIAGNOSIS — Z79899 Other long term (current) drug therapy: Secondary | ICD-10-CM | POA: Insufficient documentation

## 2015-09-26 DIAGNOSIS — I509 Heart failure, unspecified: Secondary | ICD-10-CM

## 2015-09-26 LAB — CBC WITH DIFFERENTIAL/PLATELET
Basophils Absolute: 0 10*3/uL (ref 0.0–0.1)
Basophils Relative: 0 %
EOS ABS: 0.1 10*3/uL (ref 0.0–0.7)
Eosinophils Relative: 2 %
HEMATOCRIT: 39.2 % (ref 39.0–52.0)
HEMOGLOBIN: 12.9 g/dL — AB (ref 13.0–17.0)
LYMPHS ABS: 1.3 10*3/uL (ref 0.7–4.0)
Lymphocytes Relative: 40 %
MCH: 28.5 pg (ref 26.0–34.0)
MCHC: 32.9 g/dL (ref 30.0–36.0)
MCV: 86.5 fL (ref 78.0–100.0)
MONO ABS: 0.4 10*3/uL (ref 0.1–1.0)
MONOS PCT: 10 %
NEUTROS PCT: 48 %
Neutro Abs: 1.6 10*3/uL — ABNORMAL LOW (ref 1.7–7.7)
Platelets: 258 10*3/uL (ref 150–400)
RBC: 4.53 MIL/uL (ref 4.22–5.81)
RDW: 14.4 % (ref 11.5–15.5)
WBC: 3.4 10*3/uL — ABNORMAL LOW (ref 4.0–10.5)

## 2015-09-26 LAB — COMPREHENSIVE METABOLIC PANEL
ALBUMIN: 3.7 g/dL (ref 3.5–5.0)
ALK PHOS: 63 U/L (ref 38–126)
ALT: 33 U/L (ref 17–63)
AST: 31 U/L (ref 15–41)
Anion gap: 10 (ref 5–15)
BUN: 23 mg/dL — AB (ref 6–20)
CHLORIDE: 107 mmol/L (ref 101–111)
CO2: 24 mmol/L (ref 22–32)
CREATININE: 1.2 mg/dL (ref 0.61–1.24)
Calcium: 8.7 mg/dL — ABNORMAL LOW (ref 8.9–10.3)
GFR calc non Af Amer: >60 mL/min (ref >60)
GLUCOSE: 92 mg/dL (ref 65–99)
Potassium: 3.5 mmol/L (ref 3.5–5.1)
SODIUM: 141 mmol/L (ref 135–145)
Total Bilirubin: 1.4 mg/dL — ABNORMAL HIGH (ref 0.3–1.2)
Total Protein: 6.6 g/dL (ref 6.5–8.1)

## 2015-09-26 LAB — BRAIN NATRIURETIC PEPTIDE: B NATRIURETIC PEPTIDE 5: 620 pg/mL — AB (ref 0.0–100.0)

## 2015-09-26 LAB — TROPONIN I: Troponin I: 0.03 ng/mL (ref <0.031)

## 2015-09-26 MED ORDER — IPRATROPIUM BROMIDE 0.02 % IN SOLN
0.5000 mg | Freq: Once | RESPIRATORY_TRACT | Status: AC
  Administered 2015-09-26: 0.5 mg via RESPIRATORY_TRACT
  Filled 2015-09-26: qty 2.5

## 2015-09-26 MED ORDER — FUROSEMIDE 10 MG/ML IJ SOLN
60.0000 mg | Freq: Once | INTRAMUSCULAR | Status: AC
  Administered 2015-09-26: 60 mg via INTRAVENOUS
  Filled 2015-09-26: qty 6

## 2015-09-26 MED ORDER — ALBUTEROL SULFATE (2.5 MG/3ML) 0.083% IN NEBU
5.0000 mg | INHALATION_SOLUTION | Freq: Once | RESPIRATORY_TRACT | Status: AC
  Administered 2015-09-26: 5 mg via RESPIRATORY_TRACT
  Filled 2015-09-26: qty 6

## 2015-09-26 MED ORDER — NITROGLYCERIN 2 % TD OINT
1.0000 [in_us] | TOPICAL_OINTMENT | Freq: Once | TRANSDERMAL | Status: AC
  Administered 2015-09-26: 1 [in_us] via TOPICAL
  Filled 2015-09-26: qty 1

## 2015-09-26 NOTE — ED Provider Notes (Signed)
CSN: [id number]     Arrival date & time 09/26/15  0602 History   First MD Initiated Contact with Patient 09/26/15 [phone] AM    Chief Complaint  Patient presents with  . Shortness of Breath     (Consider location/radiation/quality/duration/timing/severity/associated sxs/prior Treatment) HPI patient has a history of congestive heart failure, hypertension, morbid obesity with sleep apnea who is basically noncompliant relates that he just got out of the hospital a few days ago. He states his leg started swelling again 1-2 days ago. Since he was discharged she states he's been eating meat loaf and then a lot of fruits. He states he has "a little" chest pain and points to the central area that he describes as sharp. He states he feels short of breath. He's had some wheezing off and on. Patient states he's exposed to weigh himself every day however he has not weighed himself since last week. He states he's taking his medication.  PCP [facility]  Past Medical History  Diagnosis Date  . Hypertensive heart disease     Hypertensive heart disease with prior episodes of CHF  . Hyperlipidemia   . Hypertension   . Gastroesophageal reflux disease   . Osteoarthritis   . Peptic ulcer disease   . Morbid obesity (HCC)     /sleep apnea  . Allergic rhinitis     plus h/o asthma  . Tobacco abuse, in remission     Cigarettes discontinued in 2010  . CHF (congestive heart failure) (HCC)   . A-fib (HCC)     2015  . Asthma   . Noncompliance   . Pulmonary hypertension (HCC) 09/19/2015  . Chronic diastolic CHF (congestive heart failure) [facility])    Past Surgical History  Procedure Laterality Date  . None    . Cardiac catheterization N/A 06/14/2015    Procedure: Right/Left Heart Cath and Coronary Angiography;  Surgeon: Runell Gess, MD;  Location: [facility] INVASIVE CV LAB;  Service: Cardiovascular;  Laterality: N/A;   Family History  Problem Relation Age of Onset  . Colon cancer Neg Hx   . Inflammatory  bowel disease Neg Hx   . Liver disease Neg Hx   . Stroke Father   . Heart attack Father    Social History  Substance Use Topics  . Smoking status: Former Smoker -- 0.50 packs/day for 20 years    Types: Cigarettes    Start date: 04/26/1988    Quit date: 06/23/2007  . Smokeless tobacco: Never Used     Comment: 1 ppd former smoker  . Alcohol Use: No     Comment: no etoh since 2009  lives at home  Review of Systems  All other systems reviewed and are negative.     Allergies  Bee venom; Aspirin; Banana; Diltiazem; Hydralazine; and Lipitor  Home Medications   Prior to Admission medications   Medication Sig Start Date End Date Taking? Authorizing Provider  atorvastatin (LIPITOR) 80 MG tablet Take 1 tablet (80 mg total) by mouth daily at 6 PM. 09/15/15  Yes Estela Isaiah Blakes, MD  beclomethasone (QVAR) 80 MCG/ACT inhaler Inhale 2 puffs into the lungs daily.    Yes Historical Provider, MD  furosemide (LASIX) 40 MG tablet Take 1.5 tablets (60 mg total) by mouth 2 (two) times daily. Patient taking differently: Take 40 mg by mouth 2 (two) times daily.  09/22/15  Yes Elliot Cousin, MD  losartan (COZAAR) 100 MG tablet Take 1 tablet (100 mg total) by mouth daily. 09/15/15  Yes Henderson Cloud, MD  metoprolol succinate (TOPROL-XL) 100 MG 24 hr tablet Take 1 tablet (100 mg total) by mouth daily. Take with or immediately following a meal. 09/15/15  Yes Estela Isaiah Blakes, MD  Multiple Vitamin (MULTIVITAMIN WITH MINERALS) TABS tablet Take 1 tablet by mouth daily with breakfast. 09/22/15  Yes Elliot Cousin, MD  potassium chloride SA (K-DUR,KLOR-CON) 20 MEQ tablet Take 1 tablet (20 mEq total) by mouth 2 (two) times daily. Take with furosemide 09/22/15  Yes Elliot Cousin, MD  rivaroxaban (XARELTO) 20 MG TABS tablet Take 1 tablet (20 mg total) by mouth daily with supper. Patient taking differently: Take 20 mg by mouth daily.  09/15/15  Yes Henderson Cloud, MD  aspirin EC 81  MG EC tablet Take 1 tablet (81 mg total) by mouth daily. Patient not taking: Reported on 09/18/2015 06/17/15   [name], MD   BP 178/120 mmHg  Pulse 122  Temp(Src) 98.5 F (36.9 C) (Oral)  Resp 17  Ht 6' (1.829 m)  Wt 313 lb (141.976 kg)  BMI 42.44 kg/m2  SpO2 100%  Vital signs normal except for hypertension and tachycardia  Physical Exam  Constitutional: He is oriented to person, place, and time. He appears well-developed and well-nourished.  Non-toxic appearance. He does not appear ill. No distress.  HENT:  Head: Normocephalic and atraumatic.  Right Ear: External ear normal.  Left Ear: External ear normal.  Nose: Nose normal. No mucosal edema or rhinorrhea.  Mouth/Throat: Oropharynx is clear and moist and mucous membranes are normal. No dental abscesses or uvula swelling.  Eyes: Conjunctivae and EOM are normal. Pupils are equal, round, and reactive to light.  Neck: Normal range of motion and full passive range of motion without pain. Neck supple.  Cardiovascular: Normal rate, regular rhythm and normal heart sounds.  Exam reveals no gallop and no friction rub.   No murmur heard. Pulmonary/Chest: Accessory muscle usage present. Tachypnea noted. No respiratory distress. He has decreased breath sounds. He has no rhonchi. He has no rales. He exhibits no tenderness and no crepitus.  Rare wheeze heard  Abdominal: Soft. Normal appearance and bowel sounds are normal. He exhibits no distension. There is no tenderness. There is no rebound and no guarding.  Musculoskeletal: Normal range of motion. He exhibits edema. He exhibits no tenderness.  Patient has swelling of both lower extremities right greater than left which is chronic. He has pitting edema almost to his knees.  Neurological: He is alert and oriented to person, place, and time. He has normal strength. No cranial nerve deficit.  Skin: Skin is warm, dry and intact. No rash noted. No erythema. No pallor.  Psychiatric: He has a  normal mood and affect. His speech is normal and behavior is normal. His mood appears not anxious.  Nursing note and vitals reviewed.   ED Course  Procedures (including critical care time)  Medications  furosemide (LASIX) injection 60 mg (60 mg Intravenous Given 09/26/15 0636)  nitroGLYCERIN (NITROGLYN) 2 % ointment 1 inch (1 inch Topical Given 09/26/15 0630)  albuterol (PROVENTIL) (2.5 MG/3ML) 0.083% nebulizer solution 5 mg (5 mg Nebulization Given 09/26/15 1610)  ipratropium (ATROVENT) nebulizer solution 0.5 mg (0.5 mg Nebulization Given 09/26/15 9604)   Patient had nitroglycerin paste placed on his chest for his hypertension. He received Lasix 60 mg IV. He was given albuterol/Atrovent nebulizer treatment.  Recheck at 7:40 AM patient is feeling better. He states he has filled 1 urinal and he has  another urine although it's about one fourth full. He states his legs are feeling less tight and his breathing is doing better. His chest pain is gone. We discussed that he should be able to be discharged. He can take his blood pressure medication when he gets home.   Labs Review Results for orders placed or performed during the hospital encounter of 09/26/15  Comprehensive metabolic panel  Result Value Ref Range   Sodium 141 135 - 145 mmol/L   Potassium 3.5 3.5 - 5.1 mmol/L   Chloride 107 101 - 111 mmol/L   CO2 24 22 - 32 mmol/L   Glucose, Bld 92 65 - 99 mg/dL   BUN 23 (H) 6 - 20 mg/dL   Creatinine, Ser 4.09 0.61 - 1.24 mg/dL   Calcium 8.7 (L) 8.9 - 10.3 mg/dL   Total Protein 6.6 6.5 - 8.1 g/dL   Albumin 3.7 3.5 - 5.0 g/dL   AST 31 15 - 41 U/L   ALT 33 17 - 63 U/L   Alkaline Phosphatase 63 38 - 126 U/L   Total Bilirubin 1.4 (H) 0.3 - 1.2 mg/dL   GFR calc non Af Amer >60 >60 mL/min   GFR calc Af Amer >60 >60 mL/min   Anion gap 10 5 - 15  CBC with Differential  Result Value Ref Range   WBC 3.4 (L) 4.0 - 10.5 K/uL   RBC 4.53 4.22 - 5.81 MIL/uL   Hemoglobin 12.9 (L) 13.0 - 17.0 g/dL   HCT  81.1 91.4 - 78.2 %   MCV 86.5 78.0 - 100.0 fL   MCH 28.5 26.0 - 34.0 pg   MCHC 32.9 30.0 - 36.0 g/dL   RDW 95.6 21.3 - 08.6 %   Platelets 258 150 - 400 K/uL   Neutrophils Relative % 48 %   Neutro Abs 1.6 (L) 1.7 - 7.7 K/uL   Lymphocytes Relative 40 %   Lymphs Abs 1.3 0.7 - 4.0 K/uL   Monocytes Relative 10 %   Monocytes Absolute 0.4 0.1 - 1.0 K/uL   Eosinophils Relative 2 %   Eosinophils Absolute 0.1 0.0 - 0.7 K/uL   Basophils Relative 0 %   Basophils Absolute 0.0 0.0 - 0.1 K/uL  Brain natriuretic peptide  Result Value Ref Range   B Natriuretic Peptide 620.0 (H) 0.0 - 100.0 pg/mL  Troponin I  Result Value Ref Range   Troponin I 0.03 <0.031 ng/mL   Laboratory interpretation all normal except stable elevation of BNP which is only mildly elevated,   Imaging Review Dg Chest Port 1 View  09/26/2015  CLINICAL DATA:  Shortness of breath this morning. EXAM: PORTABLE CHEST 1 VIEW COMPARISON:  Radiographs and CT 1 week prior. FINDINGS: Marked cardiomegaly again seen, stable. Questionable vascular congestion versus differences in technique. No confluent airspace disease, pleural effusion or pneumothorax. IMPRESSION: Chronic cardiomegaly.  Questionable vascular congestion. Electronically Signed   By: Rubye Oaks M.D.   On: 09/26/2015 07:01   I have personally reviewed and evaluated these images and lab results as part of my medical decision-making.   EKG Interpretation   Date/Time:  Thursday September 26 2015 06:07:37 EDT Ventricular Rate:  127 PR Interval:    QRS Duration: 90 QT Interval:  350 QTC Calculation: 509 R Axis:   60 Text Interpretation:  Atrial fibrillation Ventricular premature complex  Borderline repolarization abnormality Prolonged QT interval No significant  change since last tracing 18 Sep 2015 Confirmed by Charleen Madera  MD-I, Florentina Marquart  ([id number]) on 09/26/2015 6:11:13  AM      MDM   Final diagnoses:  Acute on chronic congestive heart failure, unspecified congestive heart  failure type [facility])   Plan discharge  Devoria Albe, MD, Concha Pyo, MD 09/26/15 [phone]

## 2015-09-26 NOTE — ED Notes (Signed)
Pt reports that he has been increasing sob for the past few days, worse tonight with chest pain that is described as sharp and dull at times,

## 2015-09-26 NOTE — Discharge Instructions (Signed)
Try to follow a low sodium diet. Take your medications when you get home. Keep your appointments. You need to weigh yourself daily.    Heart Failure Heart failure means your heart has trouble pumping blood. This makes it hard for your body to work well. Heart failure is usually a long-term (chronic) condition. You must take good care of yourself and follow your doctor's treatment plan. HOME CARE  Take your heart medicine as told by your doctor.  Do not stop taking medicine unless your doctor tells you to.  Do not skip any dose of medicine.  Refill your medicines before they run out.  Take other medicines only as told by your doctor or pharmacist.  Stay active if told by your doctor. The elderly and people with severe heart failure should talk with a doctor about physical activity.  Eat heart-healthy foods. Choose foods that are without trans fat and are low in saturated fat, cholesterol, and salt (sodium). This includes fresh or frozen fruits and vegetables, fish, lean meats, fat-free or low-fat dairy foods, whole grains, and high-fiber foods. Lentils and dried peas and beans (legumes) are also good choices.  Limit salt if told by your doctor.  Cook in a healthy way. Roast, grill, broil, bake, poach, steam, or stir-fry foods.  Limit fluids as told by your doctor.  Weigh yourself every morning. Do this after you pee (urinate) and before you eat breakfast. Write down your weight to give to your doctor.  Take your blood pressure and write it down if your doctor tells you to.  Ask your doctor how to check your pulse. Check your pulse as told.  Lose weight if told by your doctor.  Stop smoking or chewing tobacco. Do not use gum or patches that help you quit without your doctor's approval.  Schedule and go to doctor visits as told.  Nonpregnant women should have no more than 1 drink a day. Men should have no more than 2 drinks a day. Talk to your doctor about drinking  alcohol.  Stop illegal drug use.  Stay current with shots (immunizations).  Manage your health conditions as told by your doctor.  Learn to manage your stress.  Rest when you are tired.  If it is really hot outside:  Avoid intense activities.  Use air conditioning or fans, or get in a cooler place.  Avoid caffeine and alcohol.  Wear loose-fitting, lightweight, and light-colored clothing.  If it is really cold outside:  Avoid intense activities.  Layer your clothing.  Wear mittens or gloves, a hat, and a scarf when going outside.  Avoid alcohol.  Learn about heart failure and get support as needed.  Get help to maintain or improve your quality of life and your ability to care for yourself as needed. GET HELP IF:   You gain weight quickly.  You are more short of breath than usual.  You cannot do your normal activities.  You tire easily.  You cough more than normal, especially with activity.  You have any or more puffiness (swelling) in areas such as your hands, feet, ankles, or belly (abdomen).  You cannot sleep because it is hard to breathe.  You feel like your heart is beating fast (palpitations).  You get dizzy or light-headed when you stand up. GET HELP RIGHT AWAY IF:   You have trouble breathing.  There is a change in mental status, such as becoming less alert or not being able to focus.  You have chest pain  or discomfort.  You faint. MAKE SURE YOU:   Understand these instructions.  Will watch your condition.  Will get help right away if you are not doing well or get worse.   This information is not intended to replace advice given to you by your health care provider. Make sure you discuss any questions you have with your health care provider.   Document Released: 03/17/2008 Document Revised: 06/29/2014 Document Reviewed: 07/25/2012 Elsevier Interactive Patient Education [employer]

## 2015-09-26 NOTE — ED Notes (Signed)
Pt sob for several days, chest discomfort 5/10.  Pt given one albuterol neb en route per ems.

## 2015-09-28 ENCOUNTER — Encounter (HOSPITAL_COMMUNITY): Payer: Self-pay | Admitting: Emergency Medicine

## 2015-09-28 ENCOUNTER — Inpatient Hospital Stay (HOSPITAL_COMMUNITY)
Admission: EM | Admit: 2015-09-28 | Discharge: 2015-09-30 | DRG: 292 | Disposition: A | Discharge status: Home / Self Care | Payer: Medicare Other | Attending: Internal Medicine | Admitting: Internal Medicine

## 2015-09-28 ENCOUNTER — Emergency Department (HOSPITAL_COMMUNITY): Payer: Medicare Other

## 2015-09-28 DIAGNOSIS — I110 Hypertensive heart disease with heart failure: Secondary | ICD-10-CM | POA: Diagnosis present

## 2015-09-28 DIAGNOSIS — Z79899 Other long term (current) drug therapy: Secondary | ICD-10-CM | POA: Diagnosis not present

## 2015-09-28 DIAGNOSIS — Z87891 Personal history of nicotine dependence: Secondary | ICD-10-CM

## 2015-09-28 DIAGNOSIS — Z8711 Personal history of peptic ulcer disease: Secondary | ICD-10-CM | POA: Diagnosis not present

## 2015-09-28 DIAGNOSIS — G4733 Obstructive sleep apnea (adult) (pediatric): Secondary | ICD-10-CM | POA: Diagnosis present

## 2015-09-28 DIAGNOSIS — K219 Gastro-esophageal reflux disease without esophagitis: Secondary | ICD-10-CM | POA: Diagnosis present

## 2015-09-28 DIAGNOSIS — Z9114 Patient's other noncompliance with medication regimen: Secondary | ICD-10-CM | POA: Diagnosis not present

## 2015-09-28 DIAGNOSIS — I272 Other secondary pulmonary hypertension: Secondary | ICD-10-CM | POA: Diagnosis present

## 2015-09-28 DIAGNOSIS — I4891 Unspecified atrial fibrillation: Secondary | ICD-10-CM | POA: Diagnosis present

## 2015-09-28 DIAGNOSIS — Z6837 Body mass index (BMI) 37.0-37.9, adult: Secondary | ICD-10-CM | POA: Diagnosis not present

## 2015-09-28 DIAGNOSIS — I509 Heart failure, unspecified: Secondary | ICD-10-CM | POA: Diagnosis not present

## 2015-09-28 DIAGNOSIS — I5033 Acute on chronic diastolic (congestive) heart failure: Secondary | ICD-10-CM | POA: Diagnosis present

## 2015-09-28 DIAGNOSIS — M1990 Unspecified osteoarthritis, unspecified site: Secondary | ICD-10-CM | POA: Diagnosis present

## 2015-09-28 DIAGNOSIS — J45909 Unspecified asthma, uncomplicated: Secondary | ICD-10-CM | POA: Diagnosis present

## 2015-09-28 DIAGNOSIS — Z7982 Long term (current) use of aspirin: Secondary | ICD-10-CM | POA: Diagnosis not present

## 2015-09-28 DIAGNOSIS — I10 Essential (primary) hypertension: Secondary | ICD-10-CM | POA: Diagnosis present

## 2015-09-28 DIAGNOSIS — I081 Rheumatic disorders of both mitral and tricuspid valves: Secondary | ICD-10-CM | POA: Diagnosis present

## 2015-09-28 DIAGNOSIS — N179 Acute kidney failure, unspecified: Secondary | ICD-10-CM | POA: Diagnosis present

## 2015-09-28 DIAGNOSIS — E8770 Fluid overload, unspecified: Secondary | ICD-10-CM | POA: Diagnosis not present

## 2015-09-28 DIAGNOSIS — E785 Hyperlipidemia, unspecified: Secondary | ICD-10-CM | POA: Diagnosis present

## 2015-09-28 DIAGNOSIS — J9811 Atelectasis: Secondary | ICD-10-CM | POA: Diagnosis present

## 2015-09-28 DIAGNOSIS — R0602 Shortness of breath: Secondary | ICD-10-CM | POA: Diagnosis not present

## 2015-09-28 DIAGNOSIS — E119 Type 2 diabetes mellitus without complications: Secondary | ICD-10-CM | POA: Diagnosis present

## 2015-09-28 LAB — BASIC METABOLIC PANEL
Anion gap: 9 (ref 5–15)
BUN: 18 mg/dL (ref 6–20)
CALCIUM: 9.1 mg/dL (ref 8.9–10.3)
CO2: 24 mmol/L (ref 22–32)
Chloride: 109 mmol/L (ref 101–111)
Creatinine, Ser: 1.28 mg/dL — ABNORMAL HIGH (ref 0.61–1.24)
GFR calc Af Amer: >60 mL/min (ref >60)
GLUCOSE: 93 mg/dL (ref 65–99)
Potassium: 3.5 mmol/L (ref 3.5–5.1)
SODIUM: 142 mmol/L (ref 135–145)

## 2015-09-28 LAB — CBC WITH DIFFERENTIAL/PLATELET
Basophils Absolute: 0 10*3/uL (ref 0.0–0.1)
Basophils Relative: 1 %
EOS ABS: 0.1 10*3/uL (ref 0.0–0.7)
EOS PCT: 3 %
HCT: 40.8 % (ref 39.0–52.0)
Hemoglobin: 12.6 g/dL — ABNORMAL LOW (ref 13.0–17.0)
LYMPHS ABS: 1.6 10*3/uL (ref 0.7–4.0)
Lymphocytes Relative: 40 %
MCH: 26.9 pg (ref 26.0–34.0)
MCHC: 30.9 g/dL (ref 30.0–36.0)
MCV: 87 fL (ref 78.0–100.0)
MONO ABS: 0.5 10*3/uL (ref 0.1–1.0)
MONOS PCT: 11 %
Neutro Abs: 1.9 10*3/uL (ref 1.7–7.7)
Neutrophils Relative %: 45 %
PLATELETS: 273 10*3/uL (ref 150–400)
RBC: 4.69 MIL/uL (ref 4.22–5.81)
RDW: 14.7 % (ref 11.5–15.5)
WBC: 4.1 10*3/uL (ref 4.0–10.5)

## 2015-09-28 LAB — I-STAT TROPONIN, ED: Troponin i, poc: 0.01 ng/mL (ref 0.00–0.08)

## 2015-09-28 LAB — GLUCOSE, CAPILLARY: GLUCOSE-CAPILLARY: 67 mg/dL (ref 65–99)

## 2015-09-28 LAB — BRAIN NATRIURETIC PEPTIDE: B NATRIURETIC PEPTIDE 5: 845.7 pg/mL — AB (ref 0.0–100.0)

## 2015-09-28 MED ORDER — LOSARTAN POTASSIUM 50 MG PO TABS
100.0000 mg | ORAL_TABLET | Freq: Every day | ORAL | Status: DC
  Administered 2015-09-28 – 2015-09-30 (×3): 100 mg via ORAL
  Filled 2015-09-28 (×3): qty 2

## 2015-09-28 MED ORDER — FUROSEMIDE 10 MG/ML IJ SOLN
80.0000 mg | Freq: Three times a day (TID) | INTRAMUSCULAR | Status: AC
Start: 2015-09-28 — End: 2015-09-29
  Administered 2015-09-28 – 2015-09-29 (×2): 80 mg via INTRAVENOUS
  Filled 2015-09-28 (×3): qty 8

## 2015-09-28 MED ORDER — SODIUM CHLORIDE 0.9% FLUSH
3.0000 mL | Freq: Two times a day (BID) | INTRAVENOUS | Status: DC
  Administered 2015-09-28 – 2015-09-30 (×4): 3 mL via INTRAVENOUS

## 2015-09-28 MED ORDER — PANTOPRAZOLE SODIUM 40 MG PO TBEC
40.0000 mg | DELAYED_RELEASE_TABLET | Freq: Every day | ORAL | Status: DC
  Administered 2015-09-28 – 2015-09-30 (×3): 40 mg via ORAL
  Filled 2015-09-28 (×3): qty 1

## 2015-09-28 MED ORDER — METOPROLOL SUCCINATE ER 100 MG PO TB24
100.0000 mg | ORAL_TABLET | Freq: Every day | ORAL | Status: DC
  Administered 2015-09-28 – 2015-09-29 (×2): 100 mg via ORAL
  Filled 2015-09-28 (×2): qty 1

## 2015-09-28 MED ORDER — RIVAROXABAN 20 MG PO TABS
20.0000 mg | ORAL_TABLET | Freq: Every day | ORAL | Status: DC
  Administered 2015-09-28 – 2015-09-29 (×2): 20 mg via ORAL
  Filled 2015-09-28 (×2): qty 1

## 2015-09-28 MED ORDER — ASPIRIN EC 81 MG PO TBEC
81.0000 mg | DELAYED_RELEASE_TABLET | Freq: Every day | ORAL | Status: DC
  Filled 2015-09-28 (×3): qty 1

## 2015-09-28 MED ORDER — SODIUM CHLORIDE 0.9 % IV SOLN: 250.0000 mL | INTRAVENOUS | Status: DC | PRN

## 2015-09-28 MED ORDER — BECLOMETHASONE DIPROPIONATE 80 MCG/ACT IN AERS: 2.0000 | INHALATION_SPRAY | Freq: Every day | RESPIRATORY_TRACT | Status: DC

## 2015-09-28 MED ORDER — ACETAMINOPHEN 325 MG PO TABS: 650.0000 mg | ORAL_TABLET | ORAL | Status: DC | PRN

## 2015-09-28 MED ORDER — ONDANSETRON HCL 4 MG/2ML IJ SOLN: 4.0000 mg | Freq: Four times a day (QID) | INTRAMUSCULAR | Status: DC | PRN

## 2015-09-28 MED ORDER — BUDESONIDE 0.25 MG/2ML IN SUSP
0.2500 mg | Freq: Two times a day (BID) | RESPIRATORY_TRACT | Status: DC
  Administered 2015-09-28 – 2015-09-29 (×3): 0.25 mg via RESPIRATORY_TRACT
  Filled 2015-09-28 (×4): qty 2

## 2015-09-28 MED ORDER — FUROSEMIDE 10 MG/ML IJ SOLN
40.0000 mg | Freq: Once | INTRAMUSCULAR | Status: AC
  Administered 2015-09-28: 40 mg via INTRAVENOUS
  Filled 2015-09-28: qty 4

## 2015-09-28 MED ORDER — POTASSIUM CHLORIDE CRYS ER 20 MEQ PO TBCR
40.0000 meq | EXTENDED_RELEASE_TABLET | Freq: Two times a day (BID) | ORAL | Status: DC
  Administered 2015-09-28 – 2015-09-30 (×4): 40 meq via ORAL
  Filled 2015-09-28 (×4): qty 2

## 2015-09-28 MED ORDER — FUROSEMIDE 10 MG/ML IJ SOLN: 40.0000 mg | Freq: Once | INTRAMUSCULAR | Status: DC

## 2015-09-28 MED ORDER — SODIUM CHLORIDE 0.9% FLUSH: 3.0000 mL | INTRAVENOUS | Status: DC | PRN

## 2015-09-28 MED ORDER — ATORVASTATIN CALCIUM 80 MG PO TABS
80.0000 mg | ORAL_TABLET | Freq: Every day | ORAL | Status: DC
  Administered 2015-09-28 – 2015-09-29 (×2): 80 mg via ORAL
  Filled 2015-09-28 (×2): qty 1

## 2015-09-28 NOTE — ED Provider Notes (Signed)
CSN: [id number]     Arrival date & time 09/28/15  1056 History   First MD Initiated Contact with Patient 09/28/15 1109     Chief Complaint  Patient presents with  . Shortness of Breath     (Consider location/radiation/quality/duration/timing/severity/associated sxs/prior Treatment) The history is provided by the patient.  [name] is a 50 y.o. male hx of HTN, pulmonary hypertension, diastolic CHF here with shortness of breath, leg swelling. Patient was admitted several times last month for CHF exacerbation [facility]. Patient was seen 2 days ago for persistent shortness of breath and his labs were at baseline so he was sent home. The last 2 days, he notes that shortness breath is getting worse. Tonight, he got so short of breath he was unable to sleep. Denies any chest pain. States that his leg swelling has been much worse as well. Taking lasix 40 mg once daily currently.    Past Medical History  Diagnosis Date  . Hypertensive heart disease     Hypertensive heart disease with prior episodes of CHF  . Hyperlipidemia   . Hypertension   . Gastroesophageal reflux disease   . Osteoarthritis   . Peptic ulcer disease   . Morbid obesity (HCC)     /sleep apnea  . Allergic rhinitis     plus h/o asthma  . Tobacco abuse, in remission     Cigarettes discontinued in 2010  . CHF (congestive heart failure) (HCC)   . A-fib (HCC)     2015  . Asthma   . Noncompliance   . Pulmonary hypertension (HCC) 09/19/2015  . Chronic diastolic CHF (congestive heart failure) [facility])    Past Surgical History  Procedure Laterality Date  . None    . Cardiac catheterization N/A 06/14/2015    Procedure: Right/Left Heart Cath and Coronary Angiography;  Surgeon: Runell Gess, MD;  Location: [facility] INVASIVE CV LAB;  Service: Cardiovascular;  Laterality: N/A;   Family History  Problem Relation Age of Onset  . Colon cancer Neg Hx   . Inflammatory bowel disease Neg Hx   . Liver disease Neg Hx   .  Stroke Father   . Heart attack Father    Social History  Substance Use Topics  . Smoking status: Former Smoker -- 0.50 packs/day for 20 years    Types: Cigarettes    Start date: 04/26/1988    Quit date: 06/23/2007  . Smokeless tobacco: Never Used     Comment: 1 ppd former smoker  . Alcohol Use: No     Comment: no etoh since 2009    Review of Systems  Respiratory: Positive for shortness of breath.   All other systems reviewed and are negative.     Allergies  Bee venom; Aspirin; Banana; Diltiazem; Hydralazine; and Lipitor  Home Medications   Prior to Admission medications   Medication Sig Start Date End Date Taking? Authorizing Provider  atorvastatin (LIPITOR) 80 MG tablet Take 1 tablet (80 mg total) by mouth daily at 6 PM. 09/15/15  Yes Estela Isaiah Blakes, MD  beclomethasone (QVAR) 80 MCG/ACT inhaler Inhale 2 puffs into the lungs daily.    Yes Historical Provider, MD  furosemide (LASIX) 40 MG tablet Take 1.5 tablets (60 mg total) by mouth 2 (two) times daily. Patient taking differently: Take 40 mg by mouth 2 (two) times daily.  09/22/15  Yes Elliot Cousin, MD  losartan (COZAAR) 100 MG tablet Take 1 tablet (100 mg total) by mouth daily. 09/15/15  Yes  Henderson Cloud, MD  metoprolol succinate (TOPROL-XL) 100 MG 24 hr tablet Take 1 tablet (100 mg total) by mouth daily. Take with or immediately following a meal. 09/15/15  Yes Estela Isaiah Blakes, MD  Multiple Vitamin (MULTIVITAMIN WITH MINERALS) TABS tablet Take 1 tablet by mouth daily with breakfast. 09/22/15  Yes Elliot Cousin, MD  potassium chloride SA (K-DUR,KLOR-CON) 20 MEQ tablet Take 1 tablet (20 mEq total) by mouth 2 (two) times daily. Take with furosemide 09/22/15  Yes Elliot Cousin, MD  rivaroxaban (XARELTO) 20 MG TABS tablet Take 1 tablet (20 mg total) by mouth daily with supper. Patient taking differently: Take 20 mg by mouth daily.  09/15/15  Yes Henderson Cloud, MD  aspirin EC 81 MG EC tablet  Take 1 tablet (81 mg total) by mouth daily. Patient not taking: Reported on 09/18/2015 06/17/15   [name], MD   BP 158/106 mmHg  Pulse 108  Temp(Src) 97.9 F (36.6 C) (Oral)  Resp 20  SpO2 98% Physical Exam  Constitutional: He is oriented to person, place, and time.  Chronically ill, obese   HENT:  Head: Normocephalic.  Mouth/Throat: Oropharynx is clear and moist.  Eyes: Conjunctivae are normal. Pupils are equal, round, and reactive to light.  Neck: Normal range of motion. Neck supple.  Cardiovascular: Normal rate, regular rhythm and normal heart sounds.   Pulmonary/Chest: Effort normal.  Crackles bilateral bases   Abdominal: Soft. Bowel sounds are normal.  Edema lower abdomen, no fluid wave   Musculoskeletal:  3+ edema bilateral legs   Neurological: He is alert and oriented to person, place, and time.  Skin: Skin is warm and dry.  Psychiatric: He has a normal mood and affect. His behavior is normal. Thought content normal.  Nursing note and vitals reviewed.   ED Course  Procedures (including critical care time) Labs Review Labs Reviewed  CBC WITH DIFFERENTIAL/PLATELET - Abnormal; Notable for the following:    Hemoglobin 12.6 (*)    All other components within normal limits  BASIC METABOLIC PANEL - Abnormal; Notable for the following:    Creatinine, Ser 1.28 (*)    All other components within normal limits  BRAIN NATRIURETIC PEPTIDE - Abnormal; Notable for the following:    B Natriuretic Peptide 845.7 (*)    All other components within normal limits  I-STAT TROPOININ, ED    Imaging Review Dg Chest 2 View  09/28/2015  CLINICAL DATA:  50 year old with current history of CHF, presenting with recurrent shortness of breath, chest pain, and bilateral lower extremity edema. Recent multiple hospitalizations for control of fluid status. EXAM: CHEST  2 VIEW COMPARISON:  09/26/2015 and earlier, including CTA chest 09/19/2015. FINDINGS: Cardiac silhouette markedly  enlarged, unchanged. Pulmonary venous hypertension, improved since examination 2 days ago, without overt edema currently. Mild atelectasis at the right lung base, unchanged. No new pulmonary parenchymal abnormalities. IMPRESSION: 1. Pulmonary venous hypertension, improved since examination 2 days ago, without overt edema currently. 2. Stable mild right basilar atelectasis. No acute cardiopulmonary disease otherwise. Electronically Signed   By: Hulan Saas M.D.   On: 09/28/2015 11:59   I have personally reviewed and evaluated these images and lab results as part of my medical decision-making.   EKG Interpretation   Date/Time:  Saturday September 28 2015 11:04:45 EDT Ventricular Rate:  105 PR Interval:    QRS Duration: 98 QT Interval:  381 QTC Calculation: 504 R Axis:   72 Text Interpretation:  Atrial fibrillation Ventricular premature complex  Borderline repol abnormality, diffuse leads Prolonged QT interval Baseline  wander in lead(s) V3 V4 No significant change since last tracing Confirmed  by Melida Northington  MD, Chyla Schlender ([id number]) on 09/28/2015 11:09:01 AM      MDM   Final diagnoses:  None    [name] is a 50 y.o. male here with SOB, leg swelling. Likely worsening pulmonary hypertension vs CHF exacerbation. Will get labs, BNP, CXR. Will give lasix.   1:12 PM BNP elevated to 800 compared to 600 2 days ago. CXR stable. Given lasix 40 mg IV. Consulted Dr. Mayford Knife, who recommend medicine admission for diuresis and consult as needed.     Richardean Canal, MD 09/28/15 [phone]

## 2015-09-28 NOTE — ED Notes (Signed)
Patient complains of leg swelling and shortness of breath secondary to previously diagnosed congestive heart failure.  Patient states he has been frequently seen at [facility] ED multiple times of the last few weeks.  Patient is alert and oriented and in no apparent distress at this time.

## 2015-09-28 NOTE — H&P (Signed)
Date: 09/28/2015               Patient Name:  [name] MRN: [id number]  DOB: 1966/03/25 Age / Sex: 50 y.o., male   PCP: [name]         Medical Service: Internal Medicine Teaching Service         Attending Physician: Dr. Inez Catalina, MD    First Contact: Dr. Loney Loh  Pager: [phone]  Second Contact: Dr. Beckie Salts  Pager: [phone]       After Hours (After 5p/  First Contact Pager: [phone]  weekends / holidays): Second Contact Pager: [phone]   Chief Complaint: SOB, leg swelling   History of Present Illness: [name] is a 50 y.o. male hx of HTN, HLD, pulmonary hypertension, diastolic CHF here with shortness of breath and leg swelling. Patient was admitted several times last month for CHF exacerbation at [facility]. He was seen 2 days ago in the ED for persistent shortness of breath and his labs were at baseline so he was sent home. Patient states he has had SOB with activity and leg swelling for several years but symptoms have been worse in the past 1 month. States his bilateral leg swelling now extends to his abdomen. He is also coughing and endorses symptoms of orthopnea. Home medications show Lasix 60 mg BID but patient reports taking 40 mg BID. Patient initially told us he had right sided "squeezing" chest pain but then later said it was epigastric. He does have a history of GERD and uses TUMS at home as needed. Denies having any dizziness/ lightheadedness, diaphoresis, episodes of syncope, nausea, or vomiting. He does admit to snoring at night.  In the ED, patient was found to have BNP 845.7, was 620 on 09/26/15. I-stat Troponin negative. CXR showing pulmonary venous HTN, improved since examination 2 days ago, without overt edema currently. Also showing stable mild right basilar atelectasis. He was a dose of Lasix 40 mg IV in the ED.   Meds: No current facility-administered medications for this encounter.   Current Outpatient Prescriptions  Medication  Sig Dispense Refill  . atorvastatin (LIPITOR) 80 MG tablet Take 1 tablet (80 mg total) by mouth daily at 6 PM. 30 tablet 2  . beclomethasone (QVAR) 80 MCG/ACT inhaler Inhale 2 puffs into the lungs daily.     . furosemide (LASIX) 40 MG tablet Take 1.5 tablets (60 mg total) by mouth 2 (two) times daily. (Patient taking differently: Take 40 mg by mouth 2 (two) times daily. ) 90 tablet 2  . losartan (COZAAR) 100 MG tablet Take 1 tablet (100 mg total) by mouth daily. 30 tablet 2  . metoprolol succinate (TOPROL-XL) 100 MG 24 hr tablet Take 1 tablet (100 mg total) by mouth daily. Take with or immediately following a meal. 30 tablet 2  . Multiple Vitamin (MULTIVITAMIN WITH MINERALS) TABS tablet Take 1 tablet by mouth daily with breakfast.    . potassium chloride SA (K-DUR,KLOR-CON) 20 MEQ tablet Take 1 tablet (20 mEq total) by mouth 2 (two) times daily. Take with furosemide 60 tablet 2  . rivaroxaban (XARELTO) 20 MG TABS tablet Take 1 tablet (20 mg total) by mouth daily with supper. (Patient taking differently: Take 20 mg by mouth daily. ) 30 tablet 2  . aspirin EC 81 MG EC tablet Take 1 tablet (81 mg total) by mouth daily. (Patient not taking: Reported on 09/18/2015)    . [DISCONTINUED] potassium chloride 20  MEQ TBCR Take 20 mEq by mouth 2 (two) times daily. 10 tablet 0    Allergies: Allergies as of 09/28/2015 - Review Complete 09/28/2015  Allergen Reaction Noted  . Bee venom Shortness Of Breath and Swelling 04/03/2011  . Aspirin Other (See Comments) 04/03/2011  . Banana Diarrhea 05/13/2015  . Diltiazem Other (See Comments) 09/10/2015  . Hydralazine Other (See Comments) 06/14/2015  . Lipitor [atorvastatin] Other (See Comments) 09/10/2015   Past Medical History  Diagnosis Date  . Hypertensive heart disease     Hypertensive heart disease with prior episodes of CHF  . Hyperlipidemia   . Hypertension   . Gastroesophageal reflux disease   . Osteoarthritis   . Peptic ulcer disease   . Morbid  obesity (HCC)     /sleep apnea  . Allergic rhinitis     plus h/o asthma  . Tobacco abuse, in remission     Cigarettes discontinued in 2010  . CHF (congestive heart failure) (HCC)   . A-fib (HCC)     2015  . Asthma   . Noncompliance   . Pulmonary hypertension (HCC) 09/19/2015  . Chronic diastolic CHF (congestive heart failure) [facility])    Past Surgical History  Procedure Laterality Date  . None    . Cardiac catheterization N/A 06/14/2015    Procedure: Right/Left Heart Cath and Coronary Angiography;  Surgeon: Runell Gess, MD;  Location: [facility] INVASIVE CV LAB;  Service: Cardiovascular;  Laterality: N/A;   Family History  Problem Relation Age of Onset  . Colon cancer Neg Hx   . Inflammatory bowel disease Neg Hx   . Liver disease Neg Hx   . Stroke Father   . Heart attack Father    Social History   Social History  . Marital Status: Divorced    Spouse Name: N/A  . Number of Children: 3  . Years of Education: N/A   Occupational History  . disabled    Social History Main Topics  . Smoking status: Former Smoker -- 0.50 packs/day for 20 years    Types: Cigarettes    Start date: 04/26/1988    Quit date: 06/23/2007  . Smokeless tobacco: Never Used     Comment: 1 ppd former smoker  . Alcohol Use: No     Comment: no etoh since 2009  . Drug Use: No  . Sexual Activity: No   Other Topics Concern  . Not on file   Social History Narrative    Review of Systems: Review of Systems  Constitutional: Negative for fever and chills.  HENT: Negative for congestion and sore throat.   Eyes: Negative for pain and discharge.  Respiratory: Positive for cough, sputum production and shortness of breath. Negative for wheezing.   Cardiovascular: Positive for chest pain, orthopnea and leg swelling.  Gastrointestinal: Positive for heartburn. Negative for nausea, vomiting, abdominal pain and diarrhea.  Genitourinary: Negative for dysuria, urgency and frequency.  Musculoskeletal: Negative for  myalgias and back pain.  Skin: Negative for itching and rash.  Neurological: Negative for dizziness, sensory change, focal weakness and headaches.    Physical Exam: Blood pressure 140/118, pulse 83, temperature 97.9 F (36.6 C), temperature source Oral, resp. rate 14, SpO2 100 %. Physical Exam  Constitutional: He is oriented to person, place, and time. He appears well-nourished.  Morbidly obese   HENT:  Head: Normocephalic and atraumatic.  Mouth/Throat: Oropharynx is clear and moist.  Neck: JVD present. No tracheal deviation present.  Cardiovascular: Intact distal pulses.  Exam reveals no gallop  and no friction rub.   No murmur heard. Tachycardic, Irregularly irregular rhythm   Pulmonary/Chest: He has no wheezes. He exhibits no tenderness.  Increased work of breathing Bibasilar rales   Abdominal: Soft. Bowel sounds are normal. He exhibits distension. There is no tenderness. There is no rebound and no guarding.  Musculoskeletal: He exhibits edema. He exhibits no tenderness.  +3 pitting edema of bilateral lower extremities extending up to the lower abdomen.   Neurological: He is alert and oriented to person, place, and time.  Skin: Skin is warm and dry. He is not diaphoretic.  Psychiatric: He has a normal mood and affect. His behavior is normal.    Lab results: Basic Metabolic Panel:  Recent Labs  21/30/86 0611 09/28/15 1118  NA 141 142  K 3.5 3.5  CL 107 109  CO2 24 24  GLUCOSE 92 93  BUN 23* 18  CREATININE 1.20 1.28*  CALCIUM 8.7* 9.1   Liver Function Tests:  Recent Labs  09/26/15 0611  AST 31  ALT 33  ALKPHOS 63  BILITOT 1.4*  PROT 6.6  ALBUMIN 3.7   CBC:  Recent Labs  09/26/15 0611 09/28/15 1118  WBC 3.4* 4.1  NEUTROABS 1.6* 1.9  HGB 12.9* 12.6*  HCT 39.2 40.8  MCV 86.5 87.0  PLT 258 273   Cardiac Enzymes:  Recent Labs  09/26/15 0611  TROPONINI 0.03   Urine Drug Screen: Drugs of Abuse     Component Value Date/Time   LABOPIA NONE  DETECTED 08/08/2014 2006   COCAINSCRNUR NONE DETECTED 08/08/2014 2006   LABBENZ NONE DETECTED 08/08/2014 2006   AMPHETMU NONE DETECTED 08/08/2014 2006   THCU NONE DETECTED 08/08/2014 2006   LABBARB NONE DETECTED 08/08/2014 2006    Imaging results:  Dg Chest 2 View  09/28/2015  CLINICAL DATA:  50 year old with current history of CHF, presenting with recurrent shortness of breath, chest pain, and bilateral lower extremity edema. Recent multiple hospitalizations for control of fluid status. EXAM: CHEST  2 VIEW COMPARISON:  09/26/2015 and earlier, including CTA chest 09/19/2015. FINDINGS: Cardiac silhouette markedly enlarged, unchanged. Pulmonary venous hypertension, improved since examination 2 days ago, without overt edema currently. Mild atelectasis at the right lung base, unchanged. No new pulmonary parenchymal abnormalities. IMPRESSION: 1. Pulmonary venous hypertension, improved since examination 2 days ago, without overt edema currently. 2. Stable mild right basilar atelectasis. No acute cardiopulmonary disease otherwise. Electronically Signed   By: Hulan Saas M.D.   On: 09/28/2015 11:59    Other results: EKG: Ventricular rate 105, atrial fibrillation, PVCs, QTc 504.   Assessment & Plan by Problem: Active Problems:   Congestive heart failure (HCC)  dCHF exacerbation Patient is presenting with SOB and edema of b/l lower extremities. Echo from 05/2015 showing LVEF 55% and severe concentric hypertrophy. The study was not technically sufficient to allow evaluation of LV diastolic dysfunction due to atrial fibrillation. Aortic valve midly calcified, mild mitral regurgitation, LA severely dilated, RV mildly dilated and systolic function mildly reduced, mild tricuspid regurgitation, and RA is moderately dilated. PA peak pressure elevated at 59 mmgHg and a normal CVP of 8 mmg Hg. R heart cath from 05/2015 showing normal coronary arteries with pulm HTN and elevated LVEDP. Currently on Lasix 60 mg  BID at home but taking 40 mg BID. BNP 845.7 today, was 620 on 09/26/15. CXR showing pulmonary venous HTN, improved since examination 2 days ago, without overt edema currently. Also showing stable mild right basilar atelectasis. However, physical exam remarkable for +3  bilateral lower extremity edema extending up to the lower abdomen and bibasilar rales on lung exam. Patient was given IV Lasix 40 mg once in the ED.  -Admit to tele -O2 therapy as needed to keep O2 sats >92% -Lasix 80 mg q8 hrs for 3 doses. Reassess tomorrow morning.  -K-dur 40 mg BID -Aspirin 81 mg daily  -Continue Losartan 100 mg daily  -Continue Metoprolol 100 mg daily  -Daily weights  -Strict I/Os -Repeat echo  -TSH pending   AKI Likely in the setting of diuresis. Serum creatinine is 1.28 today. Cr was 1.20 on 09/26/15. His baseline is 0.9.  -Repeat BMET in am   Hx of A-fib with RVR CHADSVASC score 2. As per cardiology note from 09/12/2015, patient was non-compliant with Eliquis and thus was started on Rivaroxaban due to easier once a day dosing.  -Continue home medication Rivaroxaban 20 mg daily   Asthma: No wheezes on exam today.  -Continue Qvar 2 puffs daily   OSA As per documentation by another provider, patient had a sleep study done in January of 2010 which verified the presence of severe obstructive sleep apnea and severe periodic limb movement disorder was also seen.It is not clear whether patient has been treated for either one of these.  -Sleep study needs to be repeated as outpatient   Hx of malignant HTN BP high at 162/124 on admission. As per cardiology note from 09/12/15, patient is a chronically non-compliant patient, and as such, Clonidine should not be used due to rebound effects. -Continue home medication Losartan 100 mg daily  -Continue home medication Metoprolol 100 mg daily -Continue Lasix as above   HLD Lipid panel from 05/2015 showing Chol 139, HDL 28, LDL 99, and trig 58.  -Lipitor 80 mg daily     Pre-diabetes A1c 5.8 in 11/2014.  -Recheck Hgb A1c level.   Hx of PUD and GERD  Patient is not currently on any home meds.  -Started on Protonix 40 mg daily   DVT/PE ppx: Rivaroxaban   Diet: Heart healthy  Dispo: Disposition is deferred at this time, awaiting improvement of current medical problems. Anticipated discharge in approximately 2-3 day(s).   The patient does have a current PCP ([facility]) and does need an [facility] hospital follow-up appointment after discharge.  The patient does not have transportation limitations that hinder transportation to clinic appointments.  Signed: John Giovanni, MD 09/28/2015, 3:02 PM

## 2015-09-28 NOTE — Progress Notes (Signed)
Continues refusing medications until 1942 when agreed to received. Reports allergy to aspirin; not given.

## 2015-09-28 NOTE — Progress Notes (Signed)
Pt refused to take medications including metoprolol as indicated Dr Loney Loh ASAP.  Reports "you guys are giving medications that are sitting on my heart to go into Afib". Dr Loney Loh paged x 3 and made aware of patient  Words and refusal taking meds. Wil  Attempt later on.

## 2015-09-28 NOTE — Progress Notes (Signed)
[facility] Admission History and Physical  Chief Complaint: leg swelling   Subjective:   HPI: [name] is a 50 year old man with a past medical history significant for hypertension, GERD, HFpEF, atrial fibrillation, and asthma who presented to the [facility] ED with leg swelling and weight gain. He says he's been having fluid retention for years now but that this is the worse that he's ever gotten. He additionally endorses that the swelling has coincided with paroxysmal nocturnal dyspnea, early satiety, bloating, orthopnea, snoring, and dyspnea on exertion (cannot walk more than a few blocks without becoming short of breath). He states that he has not been taking his furosemide regularly and that he feels like his swelling gets worse with furosemide despite having polyuria while taking it. He reports having some chest pain but when asked to point to it, he pointed to his epigastric region. He described it at first as a soreness but then said it was more of a pressure.   ROS: Const: denies fever and chills, endorses malaise Eyes: endorses myopia and hyperopia, no blurred vision Neuro: no dizziness, confusion CV: denies syncope Resp: no cough or hemoptysis, denies pain with inspiration GI: no nausea, vomiting, diarrhea Derm: endorses dry skin Endocrine: endorses cold intolerance and fatigue, denies diaphoresis GU: endorses nocturia (up to 6x nightly while on furosemide) MSK: endorses arthralgia, denies weakness  Allergies: bee-sting cause anaphylaxis   Family History: Father died in 65s of heart failure. Has many cousins with DM and HTN. No history of cancers.  Social History: Former smoker, quit in 2012 after smoking 1/2 pack daily for 20 years. Denies current alcohol use and drug use. Lives with his mother and his two children. Does not work--receives disability benefits. Does not exercise regularly outside of mowing the lawn. Ate [place] breast, bologna, fruit cocktail and Kool-Aid for dinner last  night.   Objective:  Vitals:  BP (140-162)/(118-124) mmHg  HR 56-108 bpm  T 36.6 C  RR 14-20  SpO2 97-100% on RA  Physical Exam GEN: chronically ill-appearing obese man sitting on side of bed, in no acute distress; wearing sunglasses indoors HEENT: extraocular eye movements intact,  NECK: no thyromegaly or nodules, jugular veins distended to mandible, no acanthosis nigricans present CV: tachycardic irregular rhythm, no murmur,s 1+ radial pulses bilaterally PULM: crackles at lung bases, tachypneic with some use of accessory muscles, no wheezes ABD: nontender and obese with some distension, +BS, liver span estimated to be 15cm, spleen non-palpable NEURO: did not perform at this time DERM: chronic-appearing dry skin on extremities and hands, warm and dry to touch RECTAL: did not perform (no current concern for rectal bleeding and no GI complaints) EXT: 2+ pitting edema in lower extremities to knees bilaterally, no cervical or axillary lymphadenopathy PSYCH: normal affect and appropriate mood  Labs/imaging:  BNP: 845.7 (elevated) Creatinine: 1.28 (elevated) Troponin-I: 0.01 (normal)  TTE (October 2015): LVEF 55-60%, mild LA dilation, increased LV wall thickness (LVH) EKG (September 26, 2015): irregular rate and irregular rhythm (127bpm), prolonged QTc ( ), PVC present   Yahir Palley is a 50 year old man with a past medical history of congestive heart failure, AFib, hypertension, and asthma with swelling and weight gain concerning for acute decompensation of chronic heart failure.  Assessment/Plan:    Acute decompensation of CHF    Atrial fibrillation    Hypertension    GERD    Asthma  Acute decompensation of CHF: The clinical picture of progressive weight gain and lower extremity edema with orthopnea and dyspnea on exertion, along  with elevations in pro-BNP is highly concerning for an acute decompensation of heart failure. He has previously been diagnosed with heart failure with an  ejection fraction of 55% in 2015. He admits to being nonadherent to his furosemide regimen, which likely contributed to this exacerbation.  - monitor on telemetry - trend troponins x3  - strict intake/output measurement with daily weights - diuresis with furosemide  IV BID - repeat TTE to evaluate diastolic dysfunction  - obtain HbA1c, TSH, and LDL/HDL for risk stratification - continue home metoprolol succinate  daily - continue home losartan  daily - continue home atorvastatin  daily, aspirin  daily  Atrial fibrillation: Patient was in sustained AFib while in the emergency department and complained of symptomatic palpitations but is stable and normotensive.  - continue home rivaroxaban  daily  Hypertension: Hypertensive in the ED up to 160/19mmHg.  - continue losartan, metoprolol, furosemide as above  GERD: Patient did not complain of dyspepsia or reflux, but abdominal/chest pain symptoms are likely attributable to GERD. He has had this pain for several days and it is not associated with shortness of breath or diaphoresis, making it less likely to be cardiac pain. He furthermore had no acute EKG changes or elevations in cardiac enzymes. - continue home pantoprazole  daily  Asthma: Stable with some wheezing but no recent exacerbations. Recent shortness of breath with orthopnea and PND fits the clinical picture of CHF exacerbation rather than asthma exacerbation, especially considering the prolonged time course and physical exam. - monitor with incentive spirometry - continue home inhaled beclomethasone BID - albuterol inhaler PRN

## 2015-09-29 DIAGNOSIS — Z7901 Long term (current) use of anticoagulants: Secondary | ICD-10-CM

## 2015-09-29 DIAGNOSIS — I4891 Unspecified atrial fibrillation: Secondary | ICD-10-CM

## 2015-09-29 DIAGNOSIS — R7303 Prediabetes: Secondary | ICD-10-CM

## 2015-09-29 DIAGNOSIS — G4733 Obstructive sleep apnea (adult) (pediatric): Secondary | ICD-10-CM

## 2015-09-29 DIAGNOSIS — I110 Hypertensive heart disease with heart failure: Principal | ICD-10-CM

## 2015-09-29 DIAGNOSIS — N179 Acute kidney failure, unspecified: Secondary | ICD-10-CM

## 2015-09-29 DIAGNOSIS — E8770 Fluid overload, unspecified: Secondary | ICD-10-CM

## 2015-09-29 DIAGNOSIS — I5033 Acute on chronic diastolic (congestive) heart failure: Secondary | ICD-10-CM | POA: Insufficient documentation

## 2015-09-29 DIAGNOSIS — Z8711 Personal history of peptic ulcer disease: Secondary | ICD-10-CM

## 2015-09-29 DIAGNOSIS — E785 Hyperlipidemia, unspecified: Secondary | ICD-10-CM

## 2015-09-29 LAB — BASIC METABOLIC PANEL
Anion gap: 13 (ref 5–15)
BUN: 17 mg/dL (ref 6–20)
CHLORIDE: 109 mmol/L (ref 101–111)
CO2: 24 mmol/L (ref 22–32)
CREATININE: 1.33 mg/dL — AB (ref 0.61–1.24)
Calcium: 9 mg/dL (ref 8.9–10.3)
GFR calc Af Amer: >60 mL/min (ref >60)
GFR calc non Af Amer: >60 mL/min (ref >60)
GLUCOSE: 77 mg/dL (ref 65–99)
Potassium: 3.6 mmol/L (ref 3.5–5.1)
SODIUM: 146 mmol/L — AB (ref 135–145)

## 2015-09-29 MED ORDER — FUROSEMIDE 10 MG/ML IJ SOLN
80.0000 mg | Freq: Three times a day (TID) | INTRAMUSCULAR | Status: DC
  Administered 2015-09-29 – 2015-09-30 (×4): 80 mg via INTRAVENOUS
  Filled 2015-09-29 (×4): qty 8

## 2015-09-29 NOTE — Discharge Instructions (Signed)

## 2015-09-29 NOTE — Progress Notes (Signed)
Patient is on moderate fall risk. Does not like bed alarm, but managed to tolerate it until now. States he does not like to call for assistance and or to deactivate the alarm. States he wants alarm off. Informed of the importance of the alarm especially @ hs, but still refused. Will continue to monitor patient closely.

## 2015-09-29 NOTE — Progress Notes (Signed)
  Date: 09/29/2015  Patient name: [name]  Medical record number: [id number]  Date of birth: 04-29-1966   I have seen and evaluated [name] and discussed their care with the Residency Team. Briefly, [name] is a 50yo man with history of HTN, HLD, diastolic CHF who presented with SOB, DOE, orthopnea, weight gain and LE swelling.  He reports that this has been going on for several years but worsened in the last month.  Leg swelling extends to abdomen.  He is supposed to be on 60mg  of lasix BID but was only taking 40mg  BID.  He has some chest pain, but this mostly epigastric.  In the ED, he had an elevated BNP.  Weight was up from his baseline.  PMHx, Fam Hx, and/or Soc Hx : diastolic CHF now with class III symptoms, HTN, HLD, Afib, pulmonary HTN.  FH of CAD, former smoker  Filed Vitals:   09/29/15 0020 09/29/15 0523  BP: 143/100 133/95  Pulse: 88 76  Temp: 97.3 F (36.3 C) 98.5 F (36.9 C)  Resp: 18 17   Physical exam Gen: Alert, laying relatively flat in bed Eyes: Anicteric sclerae, no conjunctival pallor Neck: JVD to angle of jaw, otherwise supple CV: RR, NR, + S3, no murmur Pulm: Crackles to mid lung, no wheezing Abd: Soft, +BS Ext: 3+ edema to knee on the right, 2+ edema to knee on the left.   Pertinent labs Cr. 1.28 TnI 0.03  QTc prolonged on EKG  Assessment and Plan: I have seen and evaluated the patient as outlined above. I agree with the formulated Assessment and Plan as detailed in the residents' note, with the following changes:   1. Acute on chronic diastolic heart failure with volume overload - Telemetry - Daily weights, strict I/O - Lasix IV 80mg  TID, would continue for further diuresis - Daily or BID BMETs to follow K and renal function  - Continue home meds of losartan and metoprolol - TTE pending  2. AKI - Baseline is normal - This could be related to acute volume overload or cardio-renal syndrome - Monitor renal function while diuresing  3.  HTN - Continue losartan, metoprolol, lasix - If chest pain recurs, can consider IMDUR  Inez Catalina, MD 4/9/201712:22 PM

## 2015-09-29 NOTE — Progress Notes (Signed)
Subjective: No acute overnight events. Patient states his breathing is much better and he is able to breathe comfortably lying down flat on the bed. No other complaints.   Objective: Vital signs in last 24 hours: Filed Vitals:   09/28/15 1633 09/28/15 2025 09/29/15 0020 09/29/15 0523  BP: 147/125  143/100 133/95  Pulse: 85 94 88 76  Temp: 97.7 F (36.5 C)  97.3 F (36.3 C) 98.5 F (36.9 C)  TempSrc: Oral  Oral Oral  Resp:  Height: 6' (1.829 m)     Weight: 296 lb 12.8 oz (134.628 kg)   291 lb 1.6 oz (132.042 kg)  SpO2: 99% 99% 100% 100%   Weight change:   Intake/Output Summary (Last 24 hours) at 09/29/15 1039 Last data filed at 09/29/15 0951  Gross per 24 hour  Intake    960 ml  Output   2650 ml  Net  -1690 ml   Physical Exam: Constitutional: He is oriented to person, place, and time. He appears well-nourished.  Morbidly obese  Neck: JVD present. Cardiovascular: Normal rate. Intact distal pulses. Exam reveals no gallop and no friction rub.  No murmur heard. Irregularly irregular rhythm  Pulmonary/Chest: Bibasilar rales. He has no wheezes or rhonchi. He exhibits no tenderness.  Abdominal: Soft. Bowel sounds are normal. There is no tenderness. There is no rebound and no guarding.  Musculoskeletal: He exhibits edema. He exhibits no tenderness.  +3 pitting edema of the RLE up to knee level and +2 pitting edema of the LLE up to knee level  Neurological: He is alert and oriented to person, place, and time.  Skin: Skin is warm and dry. He is not diaphoretic.  Psychiatric: He has a normal mood and affect. His behavior is normal.   Lab Results: Basic Metabolic Panel:  Recent Labs Lab 09/28/15 1118 09/29/15 0331  NA 142 146*  K 3.5 3.6  CL 109 109  CO2 24 24  GLUCOSE 93 77  BUN 18 17  CREATININE 1.28* 1.33*  CALCIUM 9.1 9.0   Liver Function Tests:  Recent Labs Lab 09/26/15 0611  AST 31  ALT 33  ALKPHOS 63  BILITOT 1.4*  PROT 6.6  ALBUMIN  3.7   CBC:  Recent Labs Lab 09/26/15 0611 09/28/15 1118  WBC 3.4* 4.1  NEUTROABS 1.6* 1.9  HGB 12.9* 12.6*  HCT 39.2 40.8  MCV 86.5 87.0  PLT 258 273   Cardiac Enzymes:  Recent Labs Lab 09/26/15 0611  TROPONINI 0.03   CBG:  Recent Labs Lab 09/28/15 1701  GLUCAP 67   Urine Drug Screen: Drugs of Abuse     Component Value Date/Time   LABOPIA NONE DETECTED 08/08/2014 2006   COCAINSCRNUR NONE DETECTED 08/08/2014 2006   LABBENZ NONE DETECTED 08/08/2014 2006   AMPHETMU NONE DETECTED 08/08/2014 2006   THCU NONE DETECTED 08/08/2014 2006   LABBARB NONE DETECTED 08/08/2014 2006    Micro Results: Recent Results (from the past 240 hour(s))  Culture, blood (Routine X 2) w Reflex to ID Panel     Status: None   Collection Time: 09/19/15  9:57 PM  Result Value Ref Range Status   Specimen Description BLOOD RIGHT ARM  Final   Special Requests BOTTLES DRAWN AEROBIC AND ANAEROBIC 6CC EACH  Final   Culture NO GROWTH 5 DAYS  Final   Report Status 09/24/2015 FINAL  Final  Culture, blood (Routine X 2) w Reflex to ID Panel     Status: None  Collection Time: 09/19/15 10:01 PM  Result Value Ref Range Status   Specimen Description BLOOD RIGHT ARM  Final   Special Requests BOTTLES DRAWN AEROBIC AND ANAEROBIC 6CC  Final   Culture NO GROWTH 5 DAYS  Final   Report Status 09/24/2015 FINAL  Final  Culture, Urine     Status: None   Collection Time: 09/20/15  6:56 PM  Result Value Ref Range Status   Specimen Description URINE, CLEAN CATCH  Final   Special Requests NONE  Final   Culture   Final    1,000 COLONIES/mL INSIGNIFICANT GROWTH Performed at [facility]    Report Status 09/23/2015 FINAL  Final   Studies/Results: Dg Chest 2 View  09/28/2015  CLINICAL DATA:  50 year old with current history of CHF, presenting with recurrent shortness of breath, chest pain, and bilateral lower extremity edema. Recent multiple hospitalizations for control of fluid status. EXAM: CHEST  2 VIEW  COMPARISON:  09/26/2015 and earlier, including CTA chest 09/19/2015. FINDINGS: Cardiac silhouette markedly enlarged, unchanged. Pulmonary venous hypertension, improved since examination 2 days ago, without overt edema currently. Mild atelectasis at the right lung base, unchanged. No new pulmonary parenchymal abnormalities. IMPRESSION: 1. Pulmonary venous hypertension, improved since examination 2 days ago, without overt edema currently. 2. Stable mild right basilar atelectasis. No acute cardiopulmonary disease otherwise. Electronically Signed   By: Hulan Saas M.D.   On: 09/28/2015 11:59   Medications: I have reviewed the patient's current medications. Scheduled Meds: . aspirin EC  81 mg Oral Daily  . atorvastatin  80 mg Oral q1800  . budesonide (PULMICORT) nebulizer solution  0.25 mg Nebulization BID  . furosemide  80 mg Intravenous 3 times per day  . furosemide  80 mg Intravenous 3 times per day  . losartan  100 mg Oral Daily  . metoprolol succinate  100 mg Oral Daily  . pantoprazole  40 mg Oral Daily  . potassium chloride  40 mEq Oral BID  . rivaroxaban  20 mg Oral Q supper  . sodium chloride flush  3 mL Intravenous Q12H   Continuous Infusions:  PRN Meds:.sodium chloride, acetaminophen, ondansetron (ZOFRAN) IV, sodium chloride flush Assessment/Plan: Active Problems:   Congestive heart failure (HCC)  Acute on chronic diastolic CHF with volume overload Patient received IV Lasix 200 mg since admission and had diuresed 1.4 L and lost 5 lbs weight by this morning. Physical exam today showing improvement of LE edema. Plan is to continue IV Lasix for now. His dyspnea has improved. -O2 therapy as needed to keep O2 sats >92% -Continue Lasix 80 mg q8 hrs -K-dur 40 mg BID -Aspirin 81 mg daily  -Losartan 100 mg daily  -Metoprolol 100 mg daily  -Daily weights  -Strict I/Os -Repeat echo pending -Daily BMET  AKI Likely in the setting of diuresis. Serum creatinine is 1.3 today. Cr  was 1.20 on 09/26/15. His baseline is 0.9.  -Repeat BMET in am   Hx of A-fib with RVR CHADSVASC score 2. As per cardiology note from 09/12/2015, patient was non-compliant with Eliquis and thus was started on Rivaroxaban due to easier once a day dosing.  -Rivaroxaban 20 mg daily   Asthma: No wheezes on exam today.  -Qvar 2 puffs daily   OSA As per documentation by another provider, patient had a sleep study done in January of 2010 which verified the presence of severe obstructive sleep apnea and severe periodic limb movement disorder was also seen.It is not clear whether patient has been treated for  either one of these.  -Sleep study needs to be repeated as outpatient   Hx of malignant HTN BP high at 162/124 on admission. As per cardiology note from 09/12/15, patient is a chronically non-compliant patient, and as such, Clonidine should not be used due to rebound effects. -Losartan 100 mg daily  -Metoprolol 100 mg daily -Lasix as above   HLD Lipid panel from 05/2015 showing Chol 139, HDL 28, LDL 99, and trig 58.  -Lipitor 80 mg daily   Pre-diabetes A1c 5.8 in 11/2014.  -Recheck Hgb A1c level, still pending   Hx of PUD and GERD  Patient is not currently on any home meds.  -Protonix 40 mg daily   DVT/PE ppx: Rivaroxaban   Diet: Heart healthy  Dispo: Disposition is deferred at this time, awaiting improvement of current medical problems.  Anticipated discharge in approximately 1-2 day(s).   The patient does have a current PCP ([facility]) and does need an [facility] hospital follow-up appointment after discharge.  The patient does not have transportation limitations that hinder transportation to clinic appointments.  .Services Needed at time of discharge: Y = Yes, Blank = No PT:   OT:   RN:   Equipment:   Other:     LOS: 1 day   Samuel Giovanni, MD 09/29/2015, 10:39 AM

## 2015-09-30 ENCOUNTER — Encounter: Payer: Medicare Other | Admitting: Adult Health

## 2015-09-30 ENCOUNTER — Inpatient Hospital Stay (HOSPITAL_COMMUNITY): Payer: Medicare Other

## 2015-09-30 DIAGNOSIS — I509 Heart failure, unspecified: Secondary | ICD-10-CM

## 2015-09-30 LAB — HEMOGLOBIN A1C
HEMOGLOBIN A1C: 6.2 % — AB (ref 4.8–5.6)
MEAN PLASMA GLUCOSE: 131 mg/dL

## 2015-09-30 LAB — ECHOCARDIOGRAM COMPLETE
HEIGHTINCHES: 72 in
Weight: 4476.8 oz

## 2015-09-30 LAB — BASIC METABOLIC PANEL
ANION GAP: 13 (ref 5–15)
BUN: 18 mg/dL (ref 6–20)
CALCIUM: 8.7 mg/dL — AB (ref 8.9–10.3)
CO2: 27 mmol/L (ref 22–32)
CREATININE: 1.4 mg/dL — AB (ref 0.61–1.24)
Chloride: 106 mmol/L (ref 101–111)
GFR, EST NON AFRICAN AMERICAN: 58 mL/min — AB (ref >60)
Glucose, Bld: 84 mg/dL (ref 65–99)
Potassium: 3.2 mmol/L — ABNORMAL LOW (ref 3.5–5.1)
Sodium: 146 mmol/L — ABNORMAL HIGH (ref 135–145)

## 2015-09-30 MED ORDER — METOPROLOL SUCCINATE ER 50 MG PO TB24
150.0000 mg | ORAL_TABLET | Freq: Every day | ORAL | Status: DC
  Administered 2015-09-30: 150 mg via ORAL
  Filled 2015-09-30: qty 1

## 2015-09-30 MED ORDER — FUROSEMIDE 40 MG PO TABS: 60.0000 mg | ORAL_TABLET | Freq: Two times a day (BID) | ORAL | Status: DC

## 2015-09-30 MED ORDER — FUROSEMIDE 10 MG/ML IJ SOLN: 60.0000 mg | Freq: Three times a day (TID) | INTRAMUSCULAR | Status: DC

## 2015-09-30 MED ORDER — METOPROLOL SUCCINATE ER 50 MG PO TB24: 150.0000 mg | ORAL_TABLET | Freq: Every day | ORAL | Status: DC

## 2015-09-30 NOTE — Progress Notes (Signed)
Subjective: No acute overnight events. Patient states breathing is much more imporved. He is not dyspneic at present. States his lower extremity has also improved significantly and his legs appear to be at baseline. No other complaints.   Objective: Vital signs in last 24 hours: Filed Vitals:   09/29/15 1222 09/29/15 1939 09/30/15 0002 09/30/15 0706  BP: 132/99 131/109 135/95 156/106  Pulse: 82 84 83 87  Temp: 98.6 F (37 C) 98.6 F (37 C) 98.3 F (36.8 C) 98.6 F (37 C)  TempSrc: Oral Oral Oral Oral  Resp: 20 18 16 16   Height:      Weight:    279 lb 12.8 oz (126.916 kg)  SpO2: 98% 97% 97% 95%   Weight change:   Intake/Output Summary (Last 24 hours) at 09/30/15 1607 Last data filed at 09/30/15 0850  Gross per 24 hour  Intake    840 ml  Output   2775 ml  Net  -1935 ml   Physical Exam: Constitutional: No acute distress. Sitting comfortably in bedside chair.  Cardiovascular: Normal rate. Intact distal pulses. Exam reveals no gallop and no friction rub.  No murmur heard. Irregularly irregular rhythm  Pulmonary/Chest: Bibasilar rales. He has no wheezes or rhonchi. He exhibits no tenderness.  Abdominal: Soft. Bowel sounds are normal. There is no tenderness. There is no rebound and no guarding.  Musculoskeletal: He exhibits edema. He exhibits no tenderness.  +3 pitting edema of the RLE up to knee level and +1 pitting edema of the LLE up to knee level  Neurological: He is alert and oriented to person, place, and time.  Skin: Skin is warm and dry. He is not diaphoretic.  Psychiatric: He has a normal mood and affect. His behavior is normal.   Lab Results: Basic Metabolic Panel:  Recent Labs Lab 09/29/15 0331 09/30/15 0409  NA 146* 146*  K 3.6 3.2*  CL 109 106  CO2 24 27  GLUCOSE 77 84  BUN 17 18  CREATININE 1.33* 1.40*  CALCIUM 9.0 8.7*   Liver Function Tests:  Recent Labs Lab 09/26/15 0611  AST 31  ALT 33  ALKPHOS 63  BILITOT 1.4*  PROT 6.6  ALBUMIN  3.7   CBC:  Recent Labs Lab 09/26/15 0611 09/28/15 1118  WBC 3.4* 4.1  NEUTROABS 1.6* 1.9  HGB 12.9* 12.6*  HCT 39.2 40.8  MCV 86.5 87.0  PLT 258 273   Cardiac Enzymes:  Recent Labs Lab 09/26/15 0611  TROPONINI 0.03   CBG:  Recent Labs Lab 09/28/15 1701  GLUCAP 67   Urine Drug Screen: Drugs of Abuse     Component Value Date/Time   LABOPIA NONE DETECTED 08/08/2014 2006   COCAINSCRNUR NONE DETECTED 08/08/2014 2006   LABBENZ NONE DETECTED 08/08/2014 2006   AMPHETMU NONE DETECTED 08/08/2014 2006   THCU NONE DETECTED 08/08/2014 2006   LABBARB NONE DETECTED 08/08/2014 2006    Micro Results: Recent Results (from the past 240 hour(s))  Culture, Urine     Status: None   Collection Time: 09/20/15  6:56 PM  Result Value Ref Range Status   Specimen Description URINE, CLEAN CATCH  Final   Special Requests NONE  Final   Culture   Final    1,000 COLONIES/mL INSIGNIFICANT GROWTH Performed at [facility]    Report Status 09/23/2015 FINAL  Final   Studies/Results: No results found. Medications: I have reviewed the patient's current medications. Scheduled Meds: . aspirin EC  81 mg Oral Daily  . atorvastatin  80 mg Oral q1800  . budesonide (PULMICORT) nebulizer solution  0.25 mg Nebulization BID  . furosemide  60 mg Oral BID  . losartan  100 mg Oral Daily  . metoprolol succinate  150 mg Oral Daily  . pantoprazole  40 mg Oral Daily  . potassium chloride  40 mEq Oral BID  . rivaroxaban  20 mg Oral Q supper  . sodium chloride flush  3 mL Intravenous Q12H   Continuous Infusions:  PRN Meds:.sodium chloride, acetaminophen, ondansetron (ZOFRAN) IV, sodium chloride flush Assessment/Plan: Active Problems:   Essential hypertension   A-fib (HCC)   Congestive heart failure (HCC)   Acute on chronic diastolic congestive heart failure (HCC)  Acute on chronic combined systolic and diastolic CHF with volume overload Patient has been receiving IV Lasix 80 mg TID. He  has diuresed 4.2 L and lost 17 lbs weight since admission. Echo done today showing A-fib and worsening LVEF of 20-25% (was 55-60% in 05/2015). Cardiac cath done in 05/2015 showing normal coronary arteries but elevated LVEDP. Physical exam today showing improvement of LE edema. Not dyspneic at present.  -D/c IV Lasix  -Start patient on home dose oral Lasix 60 mg BID -K-dur 40 mg BID -Aspirin 81 mg daily  -Losartan 100 mg daily  -Metoprolol 100 mg daily  -Ted-hose stockings  -Patient will likely be discharged today and has been scheduled to follow-up with cardiology as outpatient on 10/03/15.   AKI Likely in the setting of aggressive diuresis to treat volume overload . Serum creatinine is 1.4 today. His baseline is 0.9.  -D/c IV Lasix as above -Continue home dose of Lasix 60 mg BID as patient continues to be volume overloaded and has worsening systolic heart function as per echo done today.   Hx of A-fib with RVR CHADSVASC score 2. As per cardiology note from 09/12/2015, patient was non-compliant with Eliquis and thus was started on Rivaroxaban due to easier once a day dosing.  -Rivaroxaban 20 mg daily   Asthma: No wheezes on exam today.  -Qvar 2 puffs daily   OSA As per documentation by another provider, patient had a sleep study done in January of 2010 which verified the presence of severe obstructive sleep apnea and severe periodic limb movement disorder was also seen.It is not clear whether patient has been treated for either one of these.  -Sleep study needs to be repeated as outpatient   Hx of malignant HTN BP high at 156/106 this morning. As per cardiology note from 09/12/15, patient is a chronically non-compliant patient, and as such, Clonidine should not be used due to rebound effects. -continue Losartan 100 mg daily  -increase Metoprolol to 150 mg daily -Lasix as above   HLD Lipid panel from 05/2015 showing Chol 139, HDL 28, LDL 99, and trig 58.  -Lipitor 80 mg daily    Type 2 DM A1c 6.2 now. Was 5.8 in 11/2014.  -Will suggest PCP to consider starting patient on Metformin.   Hx of PUD and GERD  Patient is not currently on any home meds.  -Protonix 40 mg daily   DVT/PE ppx: Rivaroxaban   Diet: Heart healthy  Dispo: Disposition is deferred at this time, awaiting improvement of current medical problems.  Anticipated discharge in approximately 1-2 day(s).   The patient does have a current PCP ([facility]) and does need an [facility] hospital follow-up appointment after discharge.  The patient does not have transportation limitations that hinder transportation to clinic appointments.  .Services  Needed at time of discharge: Y = Yes, Blank = No PT:   OT:   RN:   Equipment:   Other:     LOS: 2 days   John Giovanni, MD 09/30/2015, 4:07 PM

## 2015-09-30 NOTE — Progress Notes (Signed)
  Date: 09/30/2015  Patient name: [name]  Medical record number: [id number]  Date of birth: May 08, 1966   This patient has been seen and the plan of care was discussed with the house staff. Please see their note for complete details. I concur with their findings with the following additions/corrections: [name] was feeling better and able to lie flat. He states his LE edema is at baseline. Lungs are CTAB. LE : hyperpig / chronic venous insuff changes. + pitting edema to knees B. Cr rising on lasix 8- IV BID. Since now euvolemic, change to home lasix dose. Stable for D/C home with cards F/U  Burns Spain, MD 09/30/2015, 2:56 PM

## 2015-09-30 NOTE — Progress Notes (Signed)
Cardiology Office Note   Date:  09/30/2015   ID:  BECKET CIANCIULLI, DOB March 31, 1966, MRN [id number]  PCP:  [name]  Cardiologist: Inis Sizer, NP   No chief complaint on file. ERROR

## 2015-09-30 NOTE — Progress Notes (Signed)
Subjective: [name] says he slept okay overnight--he got short of breath overnight and had to use his oxygen to breathe comfortably, but this is normal for him. He says his lower extremity edema is at baseline currently but still says he has some abdominal fullness. He reports he woke up in the middle of the night to urinate six times. Denies chest pain, dizziness, and palpitations.  Objective: Vital signs in last 24 hours: Filed Vitals:   09/29/15 1222 09/29/15 1939 09/30/15 0002 09/30/15 0706  BP: 132/99 131/109 135/95 156/106  Pulse: 82 84 83 87  Temp: 98.6 F (37 C) 98.6 F (37 C) 98.3 F (36.8 C) 98.6 F (37 C)  TempSrc: Oral Oral Oral Oral  Resp: Height:      Weight:    126.916 kg (279 lb 12.8 oz)  SpO2: 98% 97% 97% 95%   Weight change:  negative 5lbs from admission  Intake/Output Summary (Last 24 hours) at 09/30/15 1610 Last data filed at 09/30/15 0708  Gross per 24 hour  Intake   1320 ml  Output   4675 ml  Net  -3355 ml  - net negative 4.9L since admission  GEN: Well-appearing obese man sitting in chair with sunglasses on CV: irregular rate, irregular rhythm without murmurs; JVP noted at 4cm above clavicle without distension of neck veins PULM: crackles at bases, normal work of breathing on room air EXT: 2+ pitting edema in right leg to knee, 1+ in left leg to knee; 2+ radial pulses bilaterally   Lab Results: Na 146  K 3.2  Cr 1.4 (elevated from 1.28 on admission)  Micro Results: Recent Results (from the past 240 hour(s))  Culture, Urine     Status: None   Collection Time: 09/20/15  6:56 PM  Result Value Ref Range Status   Specimen Description URINE, CLEAN CATCH  Final   Special Requests NONE  Final   Culture   Final    1,000 COLONIES/mL INSIGNIFICANT GROWTH Performed at [facility]    Report Status 09/23/2015 FINAL  Final   Studies/Results: Dg Chest 2 View  09/28/2015  CLINICAL DATA:  50 year old with current history of CHF,  presenting with recurrent shortness of breath, chest pain, and bilateral lower extremity edema. Recent multiple hospitalizations for control of fluid status. EXAM: CHEST  2 VIEW COMPARISON:  09/26/2015 and earlier, including CTA chest 09/19/2015. FINDINGS: Cardiac silhouette markedly enlarged, unchanged. Pulmonary venous hypertension, improved since examination 2 days ago, without overt edema currently. Mild atelectasis at the right lung base, unchanged. No new pulmonary parenchymal abnormalities. IMPRESSION: 1. Pulmonary venous hypertension, improved since examination 2 days ago, without overt edema currently. 2. Stable mild right basilar atelectasis. No acute cardiopulmonary disease otherwise. Electronically Signed   By: Hulan Saas M.D.   On: 09/28/2015 11:59   Medications: I have reviewed the patient's current medications. Scheduled Meds: . aspirin EC  81 mg Oral Daily  . atorvastatin  80 mg Oral q1800  . budesonide (PULMICORT) nebulizer solution  0.25 mg Nebulization BID  . furosemide  80 mg Intravenous 3 times per day  . losartan  100 mg Oral Daily  . metoprolol succinate  100 mg Oral Daily  . pantoprazole  40 mg Oral Daily  . potassium chloride  40 mEq Oral BID  . rivaroxaban  20 mg Oral Q supper  . sodium chloride flush  3 mL Intravenous Q12H   PRN Meds:.sodium chloride, acetaminophen, ondansetron (ZOFRAN) IV, sodium chloride flush  Assessment/Plan: Active Problems:   Essential hypertension   A-fib (HCC)   Congestive heart failure (HCC)   Acute on chronic diastolic congestive heart failure (HCC)   Acute decompensation of CHF (EF 55%): His volume status appears to be improving with an obvious reduction in his lower extremity edema and improvement in respiratory status. He continues to be orthopneic but is ambulating around his room comfortably without losing his breath. He admits to being nonadherent to his home furosemide regimen. Given his rise in creatinine today, we should  slow diuresis to avoid acute kidney injury. - monitor on telemetry - repeat TTE to evaluate diastolic dysfunction  - strict intake/output measurement with daily weights - reduce diuresis to furosemide 80mg  IV BID - continue K+ repletion PO - continue home losartan 100mg  daily - continue home atorvastatin 80mg  daily, aspirin 81mg  daily - awaiting A1c for risk stratification  OSA: He has been diagnosed with OSA in the past and has been prescribed nightly CPAP, but he has never used one at home. Unsure about the findings on his polysomnography from several years ago but patient reports that he was told he needed CPAP after having several apneic events during the study. - begin CPAP nightly while in hospital  Atrial fibrillation: Had no palpitations last night and has remained within a normal range of heart rate.  - continue home rivaroxaban 20mg  daily  Hypertension: Continues to be hypertensive on hospital regimen up to 156/173mmHg. He has taken hydralazine in the past and he says it makes him tachycardic, but we should do a trial and see if it helps his blood pressure to reduce cardiac strain. If he can't tolerate this medication, we can use amlodipine. - continue losartan, furosemide as above - increasing metoprolol succinate to 150mg  daily for BP control  GERD: Stable without complaints. - continue home pantoprazole 50mg  daily  Asthma: Stable with some wheezing but no recent exacerbations. - monitor with incentive spirometry - continue home inhaled beclomethasone BID - albuterol inhaler PRN  Health maintenance: Patient is unemployed and has had 8 emergency department visits in the past 53mo, which is concerning, especially given his repeated and admitted nonadherence. He also has no primary care doctor for checkups. Regular followup is crucial to prevent future exacerbations and disease progression. Furthermore, he complains of urinary frequency and repeated nocturia and he has  never had a prostate exam - set up with PCP for active following and adherence    This is a Psychologist, occupational Note.  The care of the patient was discussed with Dr. Beckie Salts and the assessment and plan formulated with their assistance.  Please see their attached note for official documentation of the daily encounter.   LOS: 2 days   Wellington Hampshire, Med Student 09/30/2015, 8:14 AM

## 2015-09-30 NOTE — Progress Notes (Signed)
Echocardiogram 2D Echocardiogram has been performed.  Dorothey Baseman 09/30/2015, 10:45 AM

## 2015-09-30 NOTE — Discharge Summary (Signed)
Name: [name] MRN: [id number] DOB: 05-15-1966 50 y.o. PCP: [name]  Date of Admission: 09/28/2015 10:57 AM Date of Discharge: 10/03/2015 Attending Physician: No att. providers found  Discharge Diagnosis: Active Problems:   Essential hypertension   A-fib (HCC)   Congestive heart failure (HCC)   Acute on chronic diastolic congestive heart failure (HCC)  Discharge Medications:   Medication List    TAKE these medications        furosemide 40 MG tablet  Commonly known as:  LASIX     losartan 100 MG tablet  Commonly known as:  COZAAR     metoprolol succinate 100 MG 24 hr tablet  Commonly known as:  TOPROL-XL             aspirin 81 MG EC tablet  Take 1 tablet (81 mg total) by mouth daily.     atorvastatin 80 MG tablet  Commonly known as:  LIPITOR  Take 1 tablet (80 mg total) by mouth daily at 6 PM.     beclomethasone 80 MCG/ACT inhaler  Commonly known as:  QVAR  Inhale 2 puffs into the lungs daily.     multivitamin with minerals Tabs tablet  Take 1 tablet by mouth daily with breakfast.     potassium chloride SA 20 MEQ tablet  Commonly known as:  K-DUR,KLOR-CON  Take 1 tablet (20 mEq total) by mouth 2 (two) times daily. Take with furosemide     rivaroxaban 20 MG Tabs tablet  Commonly known as:  XARELTO  Take 1 tablet (20 mg total) by mouth daily with supper.        Disposition and follow-up:   [name] was discharged from [facility] in Good condition.  At the hospital follow up visit please address:  1.  CHF OSA -  Needs sleep study  2.  Labs / imaging needed at time of follow-up: Renal function panel, sleep study  3.  Pending labs/ test needing follow-up:   Follow-up Appointments:     Follow-up Information    Follow up with H[facility] Register [facility]. Schedule an appointment as soon as possible for a visit in 2 weeks.   Why:  Follow up   Contact information:   [address] [place]  [address] [phone]       Follow up with Joni Reining, NP. Go on 10/03/2015.   Specialties:  Nurse Practitioner, Radiology, Cardiology   Why:  Follow up appointment at 3:10 pm.    Contact information:   [address]  [place] [address] [phone]       Discharge Instructions:   Consultations:    Procedures Performed:  Dg Chest 2 View  09/28/2015  CLINICAL DATA:  50 year old with current history of CHF, presenting with recurrent shortness of breath, chest pain, and bilateral lower extremity edema. Recent multiple hospitalizations for control of fluid status. EXAM: CHEST  2 VIEW COMPARISON:  09/26/2015 and earlier, including CTA chest 09/19/2015. FINDINGS: Cardiac silhouette markedly enlarged, unchanged. Pulmonary venous hypertension, improved since examination 2 days ago, without overt edema currently. Mild atelectasis at the right lung base, unchanged. No new pulmonary parenchymal abnormalities. IMPRESSION: 1. Pulmonary venous hypertension, improved since examination 2 days ago, without overt edema currently. 2. Stable mild right basilar atelectasis. No acute cardiopulmonary disease otherwise. Electronically Signed   By: Hulan Saas M.D.   On: 09/28/2015 11:59   Dg Chest 2 View  09/18/2015  CLINICAL DATA:  Shortness  of Breath EXAM: CHEST  2 VIEW COMPARISON:  September 15, 2015 FINDINGS: There is no edema or consolidation. Heart is enlarged with pulmonary vascularity within normal limits, stable. No adenopathy. No bone lesions. IMPRESSION: Stable cardiac enlargement.  No edema or consolidation. Electronically Signed   By: Bretta Bang III M.D.   On: 09/18/2015 20:14   Dg Chest 2 View  09/15/2015  CLINICAL DATA:  Elevated temperature, swelling in the legs EXAM: CHEST  2 VIEW COMPARISON:  09/10/2015 FINDINGS: Cardiomegaly. Mild interstitial prominence bilateral lower lobe suspicious for residual mild interstitial edema with improvement from prior exam. No segmental infiltrate. No  pleural effusion. IMPRESSION: Cardiomegaly. Mild interstitial prominence bilateral lower lobe suspicious for residual mild interstitial edema with improvement from prior exam. No segmental infiltrate. No pleural effusion. Electronically Signed   By: Natasha Mead M.D.   On: 09/15/2015 10:07   Dg Chest 2 View  09/10/2015  CLINICAL DATA:  Short of breath.  Lower extremity swelling. EXAM: CHEST  2 VIEW COMPARISON:  09/08/2015 FINDINGS: Cardiomegaly. Moderate pulmonary edema. No pneumothorax. No pleural effusion. IMPRESSION: Worsening CHF. Electronically Signed   By: Jolaine Click M.D.   On: 09/10/2015 12:01   Dg Chest 2 View  09/09/2015  CLINICAL DATA:  Hypotension, shortness of breath, swelling in the legs. EXAM: CHEST  2 VIEW COMPARISON:  09/04/2015 FINDINGS: Cardiac enlargement with mild vascular congestion. Mild interstitial edema. Appearance is similar to previous study. No blunting of costophrenic angles. No pneumothorax. Small amount of fluid in the fissures. Mediastinal contours appear intact. IMPRESSION: Cardiac enlargement with mild pulmonary vascular congestion and mild interstitial edema. Small amount of fluid in the fissures. Electronically Signed   By: Burman Nieves M.D.   On: 09/09/2015 00:34   Dg Chest 2 View  09/04/2015  CLINICAL DATA:  Acute onset of shortness of breath and bilateral lower extremity swelling. Initial encounter. EXAM: CHEST  2 VIEW COMPARISON:  Chest radiograph performed 09/03/2015 FINDINGS: The lungs are well-aerated. Vascular congestion is noted. Bibasilar airspace opacities may reflect mild interstitial edema. There is no evidence of pleural effusion or pneumothorax. The heart is enlarged.  No acute osseous abnormalities are seen. IMPRESSION: Vascular congestion and cardiomegaly. Bibasilar airspace opacities may reflect mild interstitial edema. Electronically Signed   By: Roanna Raider M.D.   On: 09/04/2015 01:28   Ct Angio Chest Pe W/cm &/or Wo Cm  09/19/2015  CLINICAL  DATA:  Shortness of breath. Lower leg swelling and light headed for 4 days. Initial encounter. EXAM: CT ANGIOGRAPHY CHEST WITH CONTRAST TECHNIQUE: Multidetector CT imaging of the chest was performed using the standard protocol during bolus administration of intravenous contrast. Multiplanar CT image reconstructions and MIPs were obtained to evaluate the vascular anatomy. CONTRAST:  120 ml Isovue 370. COMPARISON:  PA lateral chest 09/18/2015. FINDINGS: No pulmonary embolus is identified. There is marked cardiomegaly. No pleural or pericardial effusion. No axillary, hilar or mediastinal lymphadenopathy. The lungs demonstrate minimal dependent atelectasis. 2-3 small calcified granulomata are seen in the right lower lobe and right middle lobe. There is partial visualization of a cyst in the upper pole the right kidney. Imaged upper abdomen is otherwise unremarkable. Schmorl's node is noted the superior endplate of a lower thoracic vertebral body. No worrisome bony lesion is seen. Review of the MIP images confirms the above findings. IMPRESSION: Negative for pulmonary embolus or acute disease. Marked cardiomegaly. Electronically Signed   By: Drusilla Kanner M.D.   On: 09/19/2015 18:44   Dg Chest [facility]  09/26/2015  CLINICAL DATA:  Shortness of breath this morning. EXAM: PORTABLE CHEST 1 VIEW COMPARISON:  Radiographs and CT 1 week prior. FINDINGS: Marked cardiomegaly again seen, stable. Questionable vascular congestion versus differences in technique. No confluent airspace disease, pleural effusion or pneumothorax. IMPRESSION: Chronic cardiomegaly.  Questionable vascular congestion. Electronically Signed   By: Rubye Oaks M.D.   On: 09/26/2015 07:01    2D Echo: 09/30/15 Study Conclusions  - Left ventricle: The cavity size was normal. There was severe  concentric hypertrophy. Systolic function was severely reduced.  The estimated ejection fraction was in the range of 20% to 25%.  Diffuse  hypokinesis. The study is not technically sufficient to  allow evaluation of LV diastolic function. - Mitral valve: Mildly thickened leaflets . There was trivial  regurgitation. - Left atrium: Moderately dilated at 47 ml/m2. - Right ventricle: The cavity size was mildly dilated. Systolic  function was normal. - Right atrium: Severely dilated at 29 cm2. - Tricuspid valve: There was mild regurgitation. - Pulmonary arteries: PA peak pressure: 49 mm Hg (S). - Inferior vena cava: The vessel was dilated. The respirophasic  diameter changes were blunted (< 50%), consistent with elevated  central venous pressure.  Impressions:  - Compared to a prior study in 05/2015, the LVEF has declined to  20-25% with global hypokinesis. A-fib is noted to be present.  Admission HPI: SRIMAN ADELMANN is a 50 y.o. male hx of HTN, HLD, pulmonary hypertension, diastolic CHF here with shortness of breath and leg swelling. Patient was admitted several times last month for CHF exacerbation at [facility]. He was seen 2 days ago in the ED for persistent shortness of breath and his labs were at baseline so he was sent home. Patient states he has had SOB with activity and leg swelling for several years but symptoms have been worse in the past 1 month. States his bilateral leg swelling now extends to his abdomen. He is also coughing and endorses symptoms of orthopnea. Home medications show Lasix 60 mg BID but patient reports taking 40 mg BID. Patient initially told us he had right sided "squeezing" chest pain but then later said it was epigastric. He does have a history of GERD and uses TUMS at home as needed. Denies having any dizziness/ lightheadedness, diaphoresis, episodes of syncope, nausea, or vomiting. He does admit to snoring at night.  In the ED, patient was found to have BNP 845.7, was 620 on 09/26/15. I-stat Troponin negative. CXR showing pulmonary venous HTN, improved since examination 2 days ago, without  overt edema currently. Also showing stable mild right basilar atelectasis. He was a dose of Lasix 40 mg IV in the ED.   Hospital Course by problem list: Active Problems:   Essential hypertension   A-fib (HCC)   Congestive heart failure (HCC)   Acute on chronic diastolic congestive heart failure (HCC)   Acute on chronic combined systolic and diastolic CHF with volume overload Patient received IV Lasix. He diuresed 4.2 L and lost 17 lbs weight since admission. He had improvement in his lower extremity edema and his dyspnea resolved. Echo showed A-fib and worsening LVEF of 20-25% (was 55-60% in 05/2015). Cardiac cath done in 05/2015 showed normal coronary arteries but elevated LVEDP. Patient was advised to continue taking his home dose of Lasix after discharge and to wear compression stockings. He had been scheduled to follow-up with cardiology as outpatient on 10/03/15.   AKI Likely in the setting of aggressive diuresis to treat  volume overload . Serum creatinine trended up to 1.4; his baseline is 0.9. However, patient is to continue taking his home dose of Lasix 60 mg BID as he continues to be volume overloaded and has worsening systolic heart function as per echo.    Hx of A-fib with RVR CHADSVASC score 2. He was continued on his home medication Rivaroxaban.   OSA As per documentation by another provider, patient had a sleep study done in January of 2010 which verified the presence of severe obstructive sleep apnea and severe periodic limb movement disorder was also seen.It is not clear whether patient has been treated for either one of these.  -Sleep study needs to be repeated as outpatient   Discharge Vitals:   BP 156/106 mmHg  Pulse 87  Temp(Src) 98.6 F (37 C) (Oral)  Resp 16  Ht 6' (1.829 m)  Wt 126.916 kg (279 lb 12.8 oz)  BMI 37.94 kg/m2  SpO2 95%  Discharge Labs:  No results found for this or any previous visit (from the past 24 hour(s)).  Signed: John Giovanni,  MD 10/03/2015, 10:34 PM    Services Ordered on Discharge:  Equipment Ordered on Discharge:

## 2015-10-03 ENCOUNTER — Ambulatory Visit (INDEPENDENT_AMBULATORY_CARE_PROVIDER_SITE_OTHER): Payer: Medicare Other | Admitting: Adult Health

## 2015-10-03 ENCOUNTER — Encounter: Payer: Self-pay | Admitting: Adult Health

## 2015-10-03 VITALS — BP 140/80 | HR 88 | Ht 72.0 in | Wt 298.0 lb

## 2015-10-03 DIAGNOSIS — I10 Essential (primary) hypertension: Secondary | ICD-10-CM | POA: Diagnosis not present

## 2015-10-03 MED ORDER — METOPROLOL SUCCINATE ER 50 MG PO TB24: 50.0000 mg | ORAL_TABLET | Freq: Two times a day (BID) | ORAL | Status: DC

## 2015-10-03 MED ORDER — FUROSEMIDE 40 MG PO TABS: 40.0000 mg | ORAL_TABLET | Freq: Three times a day (TID) | ORAL | Status: DC

## 2015-10-03 NOTE — Progress Notes (Signed)
Cardiology Office Note   Date:  10/03/2015   ID:  [name], DOB [birth year], MRN [id number]  PCP:  [name]  Cardiologist:   Joni Reining, NP   Chief Complaint  Patient presents with  . Congestive Heart Failure      History of Present Illness: [name] is a 50 y.o. male who presents for posthospitalization followup after admission for acute on chronic diastolic CHF, atrial fibrillation CHADS VASC Score of 2, ,with history of hypertension, hyperlipidemia, and pulmonary hypertension.he was treated with IV Lasix.he was continued on losartan and metoprolol.  He had an elevated creatinine of 1.28. He diuresis, approximately 2 L during hospitalization.he was sent home on Lasix 60 mg twice a day.   He is a difficult patient with history of recurrent admissions for decompensated heart failure, related to medical and dietary noncompliance.  The patient comes today not taking medications as directed, eating salty foods to include, hot dogs, Spam, chickenpox, high, and frozen pizza.  He is convinced that the Lasix.  This was causing him to swell and not his eating habits.  The patient was to be on Lasix 60 mg twice a day.  That is only taking 40 mg twice a day.  The patient also should be on metoprolol 3 times a day, but he is only taking it once a day.  He is argumentative and does not believe his eating habits and medication non adherence is the cause of his fluid retention.   Past Medical History  Diagnosis Date  . Hypertensive heart disease     Hypertensive heart disease with prior episodes of CHF  . Hyperlipidemia   . Hypertension   . Gastroesophageal reflux disease   . Osteoarthritis   . Peptic ulcer disease   . Morbid obesity (HCC)     /sleep apnea  . Allergic rhinitis     plus h/o asthma  . Tobacco abuse, in remission     Cigarettes discontinued in 2010  . CHF (congestive heart failure) (HCC)   . A-fib (HCC)     2015  . Asthma   . Noncompliance   .  Pulmonary hypertension (HCC) 09/19/2015  . Chronic diastolic CHF (congestive heart failure) [facility]     Past Surgical History  Procedure Laterality Date  . None    . Cardiac catheterization N/A 06/14/2015    Procedure: Right/Left Heart Cath and Coronary Angiography;  Surgeon: Runell Gess, MD;  Location: [facility] INVASIVE CV LAB;  Service: Cardiovascular;  Laterality: N/A;     Current Outpatient Prescriptions  Medication Sig Dispense Refill  . atorvastatin (LIPITOR) 80 MG tablet Take 1 tablet (80 mg total) by mouth daily at 6 PM. 30 tablet 2  . beclomethasone (QVAR) 80 MCG/ACT inhaler Inhale 2 puffs into the lungs daily.     . Multiple Vitamin (MULTIVITAMIN WITH MINERALS) TABS tablet Take 1 tablet by mouth daily with breakfast.    . potassium chloride SA (K-DUR,KLOR-CON) 20 MEQ tablet Take 1 tablet (20 mEq total) by mouth 2 (two) times daily. Take with furosemide 60 tablet 2  . rivaroxaban (XARELTO) 20 MG TABS tablet Take 1 tablet (20 mg total) by mouth daily with supper. (Patient taking differently: Take 20 mg by mouth daily. ) 30 tablet 2  . aspirin EC 81 MG EC tablet Take 1 tablet (81 mg total) by mouth daily. (Patient not taking: Reported on 09/18/2015)    . furosemide (LASIX) 40 MG tablet Take 1 tablet (40 mg  total) by mouth 3 (three) times daily. 90 tablet 6  . losartan (COZAAR) 100 MG tablet Take 100 mg by mouth daily.   1  . metoprolol succinate (TOPROL-XL) 50 MG 24 hr tablet Take 1 tablet (50 mg total) by mouth 2 (two) times daily. Take with or immediately following a meal. 180 tablet 3  . [DISCONTINUED] potassium chloride 20 MEQ TBCR Take 20 mEq by mouth 2 (two) times daily. 10 tablet 0   No current facility-administered medications for this visit.    Allergies:   Bee venom; Aspirin; Banana; Diltiazem; Hydralazine; and Lipitor    Social History:  The patient  reports that he quit smoking about 8 years ago. His smoking use included Cigarettes. He started smoking about 27 years ago.  He has a 10 pack-year smoking history. He has never used smokeless tobacco. He reports that he does not drink alcohol or use illicit drugs.   Family History:  The patient's family history includes Heart attack in his father; Stroke in his father. There is no history of Colon cancer, Inflammatory bowel disease, or Liver disease.    ROS: All other systems are reviewed and negative. Unless otherwise mentioned in H&P    PHYSICAL EXAM: VS:  BP 140/80 mmHg  Pulse 88  Ht 6' (1.829 m)  Wt 298 lb (135.172 kg)  BMI 40.41 kg/m2  SpO2 97% , BMI Body mass index is 40.41 kg/(m^2). GEN: Well nourished, well developed, in no acute distressobese HEENT: normal Neck: no JVD, carotid bruits, or masses Cardiac: IRRR; no murmurs, rubs, or gallops,no  2+ pretibial edema  Respiratory:  clear to auscultation bilaterally, normal work of breathing GI: soft, nontender, nondistended, + BS MS: no deformity or atrophy Skin: warm and dry, no rash Neuro:  Strength and sensation are intact Psych: euthymic mood, full affect  Recent Labs: 09/08/2015: Magnesium 2.0 09/19/2015: TSH 0.626 09/26/2015: ALT 33 09/28/2015: B Natriuretic Peptide 845.7*; Hemoglobin 12.6*; Platelets 273 09/30/2015: BUN 18; Creatinine, Ser 1.40*; Potassium 3.2*; Sodium 146*    Lipid Panel    Component Value Date/Time   CHOL 139 06/14/2015 0328   TRIG 58 06/14/2015 0328   HDL 28* 06/14/2015 0328   CHOLHDL 5.0 06/14/2015 0328   VLDL 12 06/14/2015 0328   LDLCALC 99 06/14/2015 0328      Wt Readings from Last 3 Encounters:  10/03/15 298 lb (135.172 kg)  09/30/15 279 lb 12.8 oz (126.916 kg)  09/26/15 313 lb (141.976 kg)     ASSESSMENT AND PLAN:  1.  Chronic diastolic heart failure:patient has a history of non adherence to low sodium diet and medications.  The patient is eating a lot of salty foods, stating, that that is what he can afford.  These include Spam, hot dogs, frozen pizza, and frozen dinners.I spent a considerable amount of  time going over dietary restrictions, along with medication adherence.  The patient is very argumentative and is not believed that these changes will make a difference.  He states he will likely be in the hospital again in a couple of days because of the medication.  He is taking.  He is convinced that Lasix is causing him to retain fluid.  Dictated noncompliance with taking Lasix 60 mg twice a day, have changed it to 40 mg 3 times a day.  I am not certain that he will take it this way.  Evidently.  She will be getting the dosage is recommended.  The patient has also been told to take his metoprolol  one in the morning and one in the evening.  He remains to be seen whether the patient will be compliant with this medication are continue his current noncompliance and belligerent attitude.  I have asked [facility] to check in with this gentleman and make advised that recommendations and supplement education and compliance.  2. Atrial fibrillation:heart rate is well controlled currently.  He is continuing to take XARELTO as directed.  We will continue his metoprolol 50 mg only twice a day versus 3 times a day.  3. Obesity:I discussed low-sodium, more healthy diet with the patient at length.  He is argumentative.  States he cannot afford to healthy foods and prefers to eat.  The frozen prepared foods.  Current medicines are reviewed at length with the patient today.    Labs/ tests ordered today include: BMET in one month  Orders Placed This Encounter  Procedures  . Basic Metabolic Panel (BMET)  . AMB Referral to [facility]     Disposition:   FU with 1 month.  Signed, Joni Reining, NP  10/03/2015 4:36 PM     [facility] [address] [place], [place] [address] Phone: [phone]; Fax: [phone]

## 2015-10-03 NOTE — Progress Notes (Deleted)
Name: [name]    DOB: 26-Nov-1965  Age: 50 y.o.  MR#: [id number]       PCP:  [name]      Insurance: Payor: [employer] MEDICARE / Plan: BCBS MEDICARE / Product Type: *No Product type* /   CC:   No chief complaint on file.   VS Filed Vitals:   10/03/15 1500  BP: 140/80  Pulse: 88  Height: 6' (1.829 m)  Weight: 298 lb (135.172 kg)  SpO2: 97%    Weights Current Weight  10/03/15 298 lb (135.172 kg)  09/30/15 279 lb 12.8 oz (126.916 kg)  09/26/15 313 lb (141.976 kg)    Blood Pressure  BP Readings from Last 3 Encounters:  10/03/15 140/80  09/30/15 156/106  09/26/15 161/109     Admit date:  (Not on file) Last encounter with RMR:  07/18/2015   Allergy Bee venom; Aspirin; Banana; Diltiazem; Hydralazine; and Lipitor  Current Outpatient Prescriptions  Medication Sig Dispense Refill  . atorvastatin (LIPITOR) 80 MG tablet Take 1 tablet (80 mg total) by mouth daily at 6 PM. 30 tablet 2  . beclomethasone (QVAR) 80 MCG/ACT inhaler Inhale 2 puffs into the lungs daily.     . furosemide (LASIX) 40 MG tablet Take 60 mg by mouth 2 (two) times daily. Take 1  1/2 twice a day,pt non compliant,forgets to take    . metoprolol succinate (TOPROL-XL) 50 MG 24 hr tablet Take 3 tablets (150 mg total) by mouth daily. Take with or immediately following a meal. 90 tablet 0  . Multiple Vitamin (MULTIVITAMIN WITH MINERALS) TABS tablet Take 1 tablet by mouth daily with breakfast.    . potassium chloride SA (K-DUR,KLOR-CON) 20 MEQ tablet Take 1 tablet (20 mEq total) by mouth 2 (two) times daily. Take with furosemide 60 tablet 2  . rivaroxaban (XARELTO) 20 MG TABS tablet Take 1 tablet (20 mg total) by mouth daily with supper. (Patient taking differently: Take 20 mg by mouth daily. ) 30 tablet 2  . aspirin EC 81 MG EC tablet Take 1 tablet (81 mg total) by mouth daily. (Patient not taking: Reported on 09/18/2015)    . losartan (COZAAR) 100 MG tablet Take 100 mg by mouth daily.    1  . [DISCONTINUED] potassium chloride 20 MEQ TBCR Take 20 mEq by mouth 2 (two) times daily. 10 tablet 0   No current facility-administered medications for this visit.    Discontinued Meds:    Medications Discontinued During This Encounter  Medication Reason  . furosemide (LASIX) 40 MG tablet Error  . losartan (COZAAR) 100 MG tablet Error  . LASIX 40 MG tablet Error    Patient Active Problem List   Diagnosis Date Noted  . Acute on chronic diastolic congestive heart failure (HCC)   . Congestive heart failure (HCC) 09/28/2015  . Gram-positive bacteremia 09/20/2015  . OSA (obstructive sleep apnea) 09/20/2015  . Pulmonary hypertension (HCC) 09/19/2015  . Leukopenia 09/19/2015  . Normocytic anemia 09/19/2015  . Elevated troponin I level 09/19/2015  . Chronic atrial fibrillation (HCC)   . Dyspnea on exertion 05/28/2015  . Acute on chronic diastolic CHF (congestive heart failure), NYHA class 1 (HCC) 05/28/2015  . Acute respiratory failure with hypoxia (HCC) 04/01/2015  . Pneumonia 04/01/2015  . Hypokalemia 04/01/2015  . Hypertensive urgency 08/08/2014  . Obesity hypoventilation syndrome (HCC) 08/08/2014  . GERD (gastroesophageal reflux disease) 08/08/2014  . Shortness of breath 08/08/2014  . Noncompliance with medication regimen 06/28/2014  .  Acute on chronic diastolic CHF (congestive heart failure) (HCC) 06/28/2014  . Edema of right lower extremity 06/21/2014  . Fecal urgency 06/21/2014  . A-fib (HCC) 04/03/2014  . Asthma 02/11/2009  . Sleep apnea 02/11/2009  . Hyperlipidemia 07/12/2008  . OBESITY, MORBID 07/12/2008  . Anxiety state 07/12/2008  . DEPRESSION 07/12/2008  . Essential hypertension 07/12/2008  . ALLERGIC RHINITIS 07/12/2008  . GERD 07/12/2008  . Peptic ulcer 07/12/2008  . OSTEOARTHRITIS 07/12/2008    LABS    Component Value Date/Time   NA 146* 09/30/2015 0409   NA 146* 09/29/2015 0331   NA 142 09/28/2015 1118   K 3.2* 09/30/2015 0409   K 3.6  09/29/2015 0331   K 3.5 09/28/2015 1118   CL 106 09/30/2015 0409   CL 109 09/29/2015 0331   CL 109 09/28/2015 1118   CO2 27 09/30/2015 0409   CO2 24 09/29/2015 0331   CO2 24 09/28/2015 1118   GLUCOSE 84 09/30/2015 0409   GLUCOSE 77 09/29/2015 0331   GLUCOSE 93 09/28/2015 1118   BUN 18 09/30/2015 0409   BUN 17 09/29/2015 0331   BUN 18 09/28/2015 1118   CREATININE 1.40* 09/30/2015 0409   CREATININE 1.33* 09/29/2015 0331   CREATININE 1.28* 09/28/2015 1118   CREATININE 1.02 07/07/2011 0909   CREATININE 1.00 01/05/2011 0840   CALCIUM 8.7* 09/30/2015 0409   CALCIUM 9.0 09/29/2015 0331   CALCIUM 9.1 09/28/2015 1118   GFRNONAA 58* 09/30/2015 0409   GFRNONAA >60 09/29/2015 0331   GFRNONAA >60 09/28/2015 1118   GFRAA >60 09/30/2015 0409   GFRAA >60 09/29/2015 0331   GFRAA >60 09/28/2015 1118   CMP     Component Value Date/Time   NA 146* 09/30/2015 0409   K 3.2* 09/30/2015 0409   CL 106 09/30/2015 0409   CO2 27 09/30/2015 0409   GLUCOSE 84 09/30/2015 0409   BUN 18 09/30/2015 0409   CREATININE 1.40* 09/30/2015 0409   CREATININE 1.02 07/07/2011 0909   CALCIUM 8.7* 09/30/2015 0409   PROT 6.6 09/26/2015 0611   ALBUMIN 3.7 09/26/2015 0611   AST 31 09/26/2015 0611   ALT 33 09/26/2015 0611   ALKPHOS 63 09/26/2015 0611   BILITOT 1.4* 09/26/2015 0611   GFRNONAA 58* 09/30/2015 0409   GFRAA >60 09/30/2015 0409       Component Value Date/Time   WBC 4.1 09/28/2015 1118   WBC 3.4* 09/26/2015 0611   WBC 3.5* 09/20/2015 0559   HGB 12.6* 09/28/2015 1118   HGB 12.9* 09/26/2015 0611   HGB 12.9* 09/20/2015 0559   HCT 40.8 09/28/2015 1118   HCT 39.2 09/26/2015 0611   HCT 39.9 09/20/2015 0559   MCV 87.0 09/28/2015 1118   MCV 86.5 09/26/2015 0611   MCV 87.1 09/20/2015 0559    Lipid Panel     Component Value Date/Time   CHOL 139 06/14/2015 0328   TRIG 58 06/14/2015 0328   HDL 28* 06/14/2015 0328   CHOLHDL 5.0 06/14/2015 0328   VLDL 12 06/14/2015 0328   LDLCALC 99 06/14/2015  0328    ABG    Component Value Date/Time   PHART 7.443 06/14/2015 0816   PCO2ART 36.5 06/14/2015 0816   PO2ART 69.0* 06/14/2015 0816   HCO3 24.9* 06/14/2015 0816   TCO2 26 06/14/2015 0816   O2SAT 94.0 06/14/2015 0816     Lab Results  Component Value Date   TSH 0.626 09/19/2015   BNP (last 3 results)  Recent Labs  09/18/15 2020 09/26/15 1610 09/28/15 1119  BNP 621.0* 620.0* 845.7*    ProBNP (last 3 results) No results for input(s): PROBNP in the last 8760 hours.  Cardiac Panel (last 3 results) No results for input(s): CKTOTAL, CKMB, TROPONINI, RELINDX in the last 72 hours.  Iron/TIBC/Ferritin/ %Sat    Component Value Date/Time   IRON 21* 09/19/2015 1130   TIBC 349 09/19/2015 1130   FERRITIN 61 09/19/2015 1130   IRONPCTSAT 6* 09/19/2015 1130     EKG Orders placed or performed during the hospital encounter of 09/28/15  . EKG 12-Lead  . EKG 12-Lead  . EKG     Prior Assessment and Plan Problem List as of 10/03/2015      Cardiovascular and Mediastinum   Essential hypertension   Last Assessment & Plan 05/07/2014 Office Visit Written 05/07/2014  4:32 PM by Jodelle Gross, NP    He continues to be non-compliant with medications. Does not take every day, and did not take today. He states it makes him feel weak and tired. Not sure if he has OSA, but certainly had body habitus for thiDue his complaints of fatigue and slow HR, I will decrease metoprolol to Toprol XL 25 mg at HS. I will increase lisinopril to 10 mg BID. Will leave him on diltiazem 120 mg in am with lasix.   He is to follow up in the [place] office as this is closer for him, and what he requests. Will need follow up BMET on next visit with Dr. Purvis Sheffield.       A-fib (HCC)   Acute on chronic diastolic CHF (congestive heart failure) (HCC)   Hypertensive urgency   Acute on chronic diastolic CHF (congestive heart failure), NYHA class 1 (HCC)   Chronic atrial fibrillation (HCC)   Pulmonary hypertension  (HCC)   Congestive heart failure (HCC)   Acute on chronic diastolic congestive heart failure [facility])     Respiratory   ALLERGIC RHINITIS   Asthma   Last Assessment & Plan 11/28/2010 Office Visit Written 11/28/2010  3:49 PM by Kathlen Brunswick, MD    Patient has required an inhaler for treatment of an exacerbation of his asthma in recent months.  This is another reason to discontinue beta blocker treatment.      Acute respiratory failure with hypoxia (HCC)   Pneumonia   OSA (obstructive sleep apnea)     Digestive   GERD   Last Assessment & Plan 06/21/2014 Office Visit Written 06/21/2014  3:28 PM by Tiffany Kocher, PA-C    Vague anorexia, weight loss, heartburn. We'll likely offer upper endoscopy for further evaluation however at this time start PPI therapy and await lower extremity Doppler ultrasound findings. Further recommendations to follow.      Peptic ulcer   Last Assessment & Plan 05/07/2014 Office Visit Written 05/07/2014  4:35 PM by Jodelle Gross, NP    He is recommended to see GI specialist to continue treatment. He had been on PPI, but has not been seen by GI, he states. I have given him the names of local GI physicians in [place], He lives in [place] and names of GI specialists there will be provided. He will follow up on his own,.       GERD (gastroesophageal reflux disease)     Musculoskeletal and Integument   OSTEOARTHRITIS     Other   Hyperlipidemia   Last Assessment & Plan 11/28/2010 Office Visit Written 11/28/2010  3:47 PM by Kathlen Brunswick, MD    Most recent lipid profile in  2010 was somewhat suboptimal, but adequate in light of the patient's absence of known vascular disease.  A repeat study will be obtained.      OBESITY, MORBID   Last Assessment & Plan 05/06/2011 Office Visit Written 05/06/2011  1:09 PM by Kathlen Brunswick, MD    Patient congratulated on 6 pound weight loss and encouraged to continue to restrict calories and to be as active as possible.       Anxiety state   DEPRESSION   Sleep apnea   Last Assessment & Plan 10/08/2011 Office Visit Written 10/08/2011  2:39 PM by Kathlen Brunswick, MD    Patient would likely benefit from a CPAP device, but does not appear motivated to obtain one.      Edema of right lower extremity   Last Assessment & Plan 06/21/2014 Office Visit Written 06/21/2014  3:26 PM by Tiffany Kocher, PA-C    50 year old gentleman with history of chronic lower extremity edema with current right greater than left swelling. History of atrial fibrillation with irregular rhythm on exam today. Patient deemed not a candidate for anticoagulation due to his medical noncompliance. Obtain venous ultrasound to rule out DVT.       Fecal urgency   Last Assessment & Plan 06/21/2014 Office Visit Written 06/21/2014  3:27 PM by Tiffany Kocher, PA-C    One month history of postprandial fecal urgency without diarrhea. No prior colonoscopy. Given he is having solid stools, no indication for stool studies. Will offer him a colonoscopy in the near future however await lower extremity ultrasound findings first.      Noncompliance with medication regimen   Obesity hypoventilation syndrome (HCC)   Shortness of breath   Hypokalemia   Dyspnea on exertion   Leukopenia   Normocytic anemia   Elevated troponin I level   Gram-positive bacteremia       Imaging: Dg Chest 2 View  09/28/2015  CLINICAL DATA:  50 year old with current history of CHF, presenting with recurrent shortness of breath, chest pain, and bilateral lower extremity edema. Recent multiple hospitalizations for control of fluid status. EXAM: CHEST  2 VIEW COMPARISON:  09/26/2015 and earlier, including CTA chest 09/19/2015. FINDINGS: Cardiac silhouette markedly enlarged, unchanged. Pulmonary venous hypertension, improved since examination 2 days ago, without overt edema currently. Mild atelectasis at the right lung base, unchanged. No new pulmonary parenchymal abnormalities. IMPRESSION:  1. Pulmonary venous hypertension, improved since examination 2 days ago, without overt edema currently. 2. Stable mild right basilar atelectasis. No acute cardiopulmonary disease otherwise. Electronically Signed   By: Hulan Saas M.D.   On: 09/28/2015 11:59   Dg Chest 2 View  09/18/2015  CLINICAL DATA:  Shortness of Breath EXAM: CHEST  2 VIEW COMPARISON:  September 15, 2015 FINDINGS: There is no edema or consolidation. Heart is enlarged with pulmonary vascularity within normal limits, stable. No adenopathy. No bone lesions. IMPRESSION: Stable cardiac enlargement.  No edema or consolidation. Electronically Signed   By: Bretta Bang III M.D.   On: 09/18/2015 20:14   Dg Chest 2 View  09/15/2015  CLINICAL DATA:  Elevated temperature, swelling in the legs EXAM: CHEST  2 VIEW COMPARISON:  09/10/2015 FINDINGS: Cardiomegaly. Mild interstitial prominence bilateral lower lobe suspicious for residual mild interstitial edema with improvement from prior exam. No segmental infiltrate. No pleural effusion. IMPRESSION: Cardiomegaly. Mild interstitial prominence bilateral lower lobe suspicious for residual mild interstitial edema with improvement from prior exam. No segmental infiltrate. No pleural effusion. Electronically Signed   By:  Natasha Mead M.D.   On: 09/15/2015 10:07   Dg Chest 2 View  09/10/2015  CLINICAL DATA:  Short of breath.  Lower extremity swelling. EXAM: CHEST  2 VIEW COMPARISON:  09/08/2015 FINDINGS: Cardiomegaly. Moderate pulmonary edema. No pneumothorax. No pleural effusion. IMPRESSION: Worsening CHF. Electronically Signed   By: Jolaine Click M.D.   On: 09/10/2015 12:01   Dg Chest 2 View  09/09/2015  CLINICAL DATA:  Hypotension, shortness of breath, swelling in the legs. EXAM: CHEST  2 VIEW COMPARISON:  09/04/2015 FINDINGS: Cardiac enlargement with mild vascular congestion. Mild interstitial edema. Appearance is similar to previous study. No blunting of costophrenic angles. No pneumothorax. Small  amount of fluid in the fissures. Mediastinal contours appear intact. IMPRESSION: Cardiac enlargement with mild pulmonary vascular congestion and mild interstitial edema. Small amount of fluid in the fissures. Electronically Signed   By: Burman Nieves M.D.   On: 09/09/2015 00:34   Dg Chest 2 View  09/04/2015  CLINICAL DATA:  Acute onset of shortness of breath and bilateral lower extremity swelling. Initial encounter. EXAM: CHEST  2 VIEW COMPARISON:  Chest radiograph performed 09/03/2015 FINDINGS: The lungs are well-aerated. Vascular congestion is noted. Bibasilar airspace opacities may reflect mild interstitial edema. There is no evidence of pleural effusion or pneumothorax. The heart is enlarged.  No acute osseous abnormalities are seen. IMPRESSION: Vascular congestion and cardiomegaly. Bibasilar airspace opacities may reflect mild interstitial edema. Electronically Signed   By: Roanna Raider M.D.   On: 09/04/2015 01:28   Ct Angio Chest Pe W/cm &/or Wo Cm  09/19/2015  CLINICAL DATA:  Shortness of breath. Lower leg swelling and light headed for 4 days. Initial encounter. EXAM: CT ANGIOGRAPHY CHEST WITH CONTRAST TECHNIQUE: Multidetector CT imaging of the chest was performed using the standard protocol during bolus administration of intravenous contrast. Multiplanar CT image reconstructions and MIPs were obtained to evaluate the vascular anatomy. CONTRAST:  120 ml Isovue 370. COMPARISON:  PA lateral chest 09/18/2015. FINDINGS: No pulmonary embolus is identified. There is marked cardiomegaly. No pleural or pericardial effusion. No axillary, hilar or mediastinal lymphadenopathy. The lungs demonstrate minimal dependent atelectasis. 2-3 small calcified granulomata are seen in the right lower lobe and right middle lobe. There is partial visualization of a cyst in the upper pole the right kidney. Imaged upper abdomen is otherwise unremarkable. Schmorl's node is noted the superior endplate of a lower thoracic  vertebral body. No worrisome bony lesion is seen. Review of the MIP images confirms the above findings. IMPRESSION: Negative for pulmonary embolus or acute disease. Marked cardiomegaly. Electronically Signed   By: Drusilla Kanner M.D.   On: 09/19/2015 18:44   Dg Chest Port 1 View  09/26/2015  CLINICAL DATA:  Shortness of breath this morning. EXAM: PORTABLE CHEST 1 VIEW COMPARISON:  Radiographs and CT 1 week prior. FINDINGS: Marked cardiomegaly again seen, stable. Questionable vascular congestion versus differences in technique. No confluent airspace disease, pleural effusion or pneumothorax. IMPRESSION: Chronic cardiomegaly.  Questionable vascular congestion. Electronically Signed   By: Rubye Oaks M.D.   On: 09/26/2015 07:01

## 2015-10-03 NOTE — Patient Instructions (Addendum)
Your physician recommends that you schedule a follow-up appointment in: 1 Month with Joni Reining, NP  Your physician recommends that you return for lab work ( Please have Labs done just your next visit)  Your physician has recommended you make the following change in your medication:   Increase Lasix to 40 mg Three Times Daily   Decrease Metoprolol to 50 mg Two Times Daily  You have been referred to [facility] network.   If you need a refill on your cardiac medications before your next appointment, please call your pharmacy.  Thank you for choosing [facility]!

## 2015-10-07 ENCOUNTER — Observation Stay (HOSPITAL_COMMUNITY): Payer: Medicare Other

## 2015-10-07 ENCOUNTER — Inpatient Hospital Stay (HOSPITAL_COMMUNITY)
Admission: EM | Admit: 2015-10-07 | Discharge: 2015-10-15 | DRG: 291 | Disposition: A | Discharge status: Home Health | Payer: Medicare Other | Attending: Internal Medicine | Admitting: Internal Medicine

## 2015-10-07 ENCOUNTER — Encounter (HOSPITAL_COMMUNITY): Payer: Self-pay | Admitting: *Deleted

## 2015-10-07 ENCOUNTER — Emergency Department (HOSPITAL_COMMUNITY): Payer: Medicare Other

## 2015-10-07 DIAGNOSIS — J9601 Acute respiratory failure with hypoxia: Secondary | ICD-10-CM | POA: Diagnosis present

## 2015-10-07 DIAGNOSIS — Z6841 Body Mass Index (BMI) 40.0 and over, adult: Secondary | ICD-10-CM

## 2015-10-07 DIAGNOSIS — I272 Other secondary pulmonary hypertension: Secondary | ICD-10-CM | POA: Diagnosis present

## 2015-10-07 DIAGNOSIS — J45909 Unspecified asthma, uncomplicated: Secondary | ICD-10-CM | POA: Diagnosis present

## 2015-10-07 DIAGNOSIS — I5023 Acute on chronic systolic (congestive) heart failure: Secondary | ICD-10-CM

## 2015-10-07 DIAGNOSIS — I43 Cardiomyopathy in diseases classified elsewhere: Secondary | ICD-10-CM | POA: Diagnosis present

## 2015-10-07 DIAGNOSIS — I5043 Acute on chronic combined systolic (congestive) and diastolic (congestive) heart failure: Secondary | ICD-10-CM

## 2015-10-07 DIAGNOSIS — I10 Essential (primary) hypertension: Secondary | ICD-10-CM | POA: Diagnosis present

## 2015-10-07 DIAGNOSIS — R609 Edema, unspecified: Secondary | ICD-10-CM | POA: Diagnosis not present

## 2015-10-07 DIAGNOSIS — Z9114 Patient's other noncompliance with medication regimen: Secondary | ICD-10-CM

## 2015-10-07 DIAGNOSIS — N179 Acute kidney failure, unspecified: Secondary | ICD-10-CM | POA: Diagnosis present

## 2015-10-07 DIAGNOSIS — G4730 Sleep apnea, unspecified: Secondary | ICD-10-CM | POA: Diagnosis present

## 2015-10-07 DIAGNOSIS — Z91148 Patient's other noncompliance with medication regimen for other reason: Secondary | ICD-10-CM

## 2015-10-07 DIAGNOSIS — I5020 Unspecified systolic (congestive) heart failure: Secondary | ICD-10-CM | POA: Diagnosis present

## 2015-10-07 DIAGNOSIS — I509 Heart failure, unspecified: Secondary | ICD-10-CM

## 2015-10-07 DIAGNOSIS — I160 Hypertensive urgency: Secondary | ICD-10-CM | POA: Diagnosis present

## 2015-10-07 DIAGNOSIS — T502X5A Adverse effect of carbonic-anhydrase inhibitors, benzothiadiazides and other diuretics, initial encounter: Secondary | ICD-10-CM | POA: Diagnosis present

## 2015-10-07 DIAGNOSIS — Z7901 Long term (current) use of anticoagulants: Secondary | ICD-10-CM

## 2015-10-07 DIAGNOSIS — I482 Chronic atrial fibrillation, unspecified: Secondary | ICD-10-CM | POA: Diagnosis present

## 2015-10-07 DIAGNOSIS — I110 Hypertensive heart disease with heart failure: Secondary | ICD-10-CM | POA: Diagnosis not present

## 2015-10-07 DIAGNOSIS — K219 Gastro-esophageal reflux disease without esophagitis: Secondary | ICD-10-CM | POA: Diagnosis present

## 2015-10-07 DIAGNOSIS — Z79899 Other long term (current) drug therapy: Secondary | ICD-10-CM

## 2015-10-07 DIAGNOSIS — Z9111 Patient's noncompliance with dietary regimen: Secondary | ICD-10-CM

## 2015-10-07 DIAGNOSIS — E785 Hyperlipidemia, unspecified: Secondary | ICD-10-CM | POA: Diagnosis present

## 2015-10-07 DIAGNOSIS — I5033 Acute on chronic diastolic (congestive) heart failure: Secondary | ICD-10-CM | POA: Diagnosis present

## 2015-10-07 DIAGNOSIS — Z87891 Personal history of nicotine dependence: Secondary | ICD-10-CM

## 2015-10-07 LAB — CBC
HEMATOCRIT: 35.7 % — AB (ref 39.0–52.0)
HEMOGLOBIN: 11.5 g/dL — AB (ref 13.0–17.0)
MCH: 28 pg (ref 26.0–34.0)
MCHC: 32.2 g/dL (ref 30.0–36.0)
MCV: 86.9 fL (ref 78.0–100.0)
Platelets: 195 10*3/uL (ref 150–400)
RBC: 4.11 MIL/uL — AB (ref 4.22–5.81)
RDW: 14.7 % (ref 11.5–15.5)
WBC: 3.3 10*3/uL — ABNORMAL LOW (ref 4.0–10.5)

## 2015-10-07 LAB — PROTIME-INR
INR: 1.25 (ref 0.00–1.49)
Prothrombin Time: 15.8 seconds — ABNORMAL HIGH (ref 11.6–15.2)

## 2015-10-07 LAB — BASIC METABOLIC PANEL
ANION GAP: 8 (ref 5–15)
BUN: 23 mg/dL — ABNORMAL HIGH (ref 6–20)
CHLORIDE: 110 mmol/L (ref 101–111)
CO2: 25 mmol/L (ref 22–32)
Calcium: 8.6 mg/dL — ABNORMAL LOW (ref 8.9–10.3)
Creatinine, Ser: 1.25 mg/dL — ABNORMAL HIGH (ref 0.61–1.24)
GFR calc non Af Amer: >60 mL/min (ref >60)
GLUCOSE: 107 mg/dL — AB (ref 65–99)
POTASSIUM: 3.7 mmol/L (ref 3.5–5.1)
Sodium: 143 mmol/L (ref 135–145)

## 2015-10-07 LAB — BRAIN NATRIURETIC PEPTIDE: B NATRIURETIC PEPTIDE 5: 616 pg/mL — AB (ref 0.0–100.0)

## 2015-10-07 LAB — TROPONIN I: Troponin I: 0.03 ng/mL (ref <0.031)

## 2015-10-07 MED ORDER — FUROSEMIDE 10 MG/ML IJ SOLN
60.0000 mg | Freq: Once | INTRAMUSCULAR | Status: AC
Start: 2015-10-07 — End: 2015-10-07
  Administered 2015-10-07: 60 mg via INTRAVENOUS
  Filled 2015-10-07: qty 6

## 2015-10-07 MED ORDER — SODIUM CHLORIDE 0.9% FLUSH
3.0000 mL | INTRAVENOUS | Status: DC | PRN
  Administered 2015-10-11: 3 mL via INTRAVENOUS
  Filled 2015-10-07: qty 3

## 2015-10-07 MED ORDER — LOSARTAN POTASSIUM 50 MG PO TABS
100.0000 mg | ORAL_TABLET | Freq: Every day | ORAL | Status: DC
  Administered 2015-10-08 – 2015-10-15 (×8): 100 mg via ORAL
  Filled 2015-10-07 (×9): qty 2

## 2015-10-07 MED ORDER — FUROSEMIDE 10 MG/ML IJ SOLN
40.0000 mg | Freq: Two times a day (BID) | INTRAMUSCULAR | Status: DC
  Administered 2015-10-08 – 2015-10-11 (×7): 40 mg via INTRAVENOUS
  Filled 2015-10-07 (×7): qty 4

## 2015-10-07 MED ORDER — SODIUM CHLORIDE 0.9% FLUSH
3.0000 mL | Freq: Two times a day (BID) | INTRAVENOUS | Status: DC
  Administered 2015-10-07 – 2015-10-14 (×9): 3 mL via INTRAVENOUS

## 2015-10-07 MED ORDER — ATORVASTATIN CALCIUM 40 MG PO TABS
80.0000 mg | ORAL_TABLET | Freq: Every day | ORAL | Status: DC
  Administered 2015-10-08 – 2015-10-10 (×3): 80 mg via ORAL
  Filled 2015-10-07 (×4): qty 2

## 2015-10-07 MED ORDER — BUDESONIDE 0.25 MG/2ML IN SUSP
0.2500 mg | Freq: Two times a day (BID) | RESPIRATORY_TRACT | Status: DC
  Administered 2015-10-07 – 2015-10-15 (×16): 0.25 mg via RESPIRATORY_TRACT
  Filled 2015-10-07 (×16): qty 2

## 2015-10-07 MED ORDER — SODIUM CHLORIDE 0.9% FLUSH
3.0000 mL | Freq: Two times a day (BID) | INTRAVENOUS | Status: DC
  Administered 2015-10-07 – 2015-10-15 (×11): 3 mL via INTRAVENOUS

## 2015-10-07 MED ORDER — SODIUM CHLORIDE 0.9 % IV SOLN: 250.0000 mL | INTRAVENOUS | Status: DC | PRN

## 2015-10-07 MED ORDER — NITROGLYCERIN 2 % TD OINT
1.0000 [in_us] | TOPICAL_OINTMENT | Freq: Once | TRANSDERMAL | Status: AC
  Administered 2015-10-07: 1 [in_us] via TOPICAL
  Filled 2015-10-07: qty 1

## 2015-10-07 MED ORDER — RIVAROXABAN 20 MG PO TABS
20.0000 mg | ORAL_TABLET | Freq: Every day | ORAL | Status: DC
  Administered 2015-10-07 – 2015-10-14 (×8): 20 mg via ORAL
  Filled 2015-10-07 (×8): qty 1

## 2015-10-07 MED ORDER — POTASSIUM CHLORIDE CRYS ER 20 MEQ PO TBCR
20.0000 meq | EXTENDED_RELEASE_TABLET | Freq: Two times a day (BID) | ORAL | Status: DC
  Administered 2015-10-07 – 2015-10-15 (×15): 20 meq via ORAL
  Filled 2015-10-07 (×16): qty 1

## 2015-10-07 MED ORDER — ONDANSETRON HCL 4 MG/2ML IJ SOLN
4.0000 mg | Freq: Four times a day (QID) | INTRAMUSCULAR | Status: DC | PRN
  Administered 2015-10-08: 4 mg via INTRAVENOUS
  Filled 2015-10-07: qty 2

## 2015-10-07 MED ORDER — ONDANSETRON HCL 4 MG PO TABS: 4.0000 mg | ORAL_TABLET | Freq: Four times a day (QID) | ORAL | Status: DC | PRN

## 2015-10-07 MED ORDER — BECLOMETHASONE DIPROPIONATE 80 MCG/ACT IN AERS: 2.0000 | INHALATION_SPRAY | Freq: Every day | RESPIRATORY_TRACT | Status: DC

## 2015-10-07 MED ORDER — METOPROLOL SUCCINATE ER 50 MG PO TB24
50.0000 mg | ORAL_TABLET | Freq: Two times a day (BID) | ORAL | Status: DC
  Administered 2015-10-07 – 2015-10-08 (×3): 50 mg via ORAL
  Filled 2015-10-07 (×3): qty 1

## 2015-10-07 NOTE — ED Notes (Signed)
Pt reports "too weak" to walk. Pt stood up by the bed and reports "let me rest for a little bit." moderate fatigue and lethargy noted at this time. Pt denies pain.

## 2015-10-07 NOTE — ED Provider Notes (Signed)
CSN: [id number]     Arrival date & time 10/07/15  1702 History   First MD Initiated Contact with Patient 10/07/15 1739     Chief Complaint  Patient presents with  . Congestive Heart Failure     (Consider location/radiation/quality/duration/timing/severity/associated sxs/prior Treatment) HPI Comments: Patient with history of congestive heart failure, hypertension, atrial fibrillation on xarelto presenting with worsening shortness of breath, weight gain and leg swelling over the past several days. Endorses shortness of breath with exertion. He recently saw his cardiologist 5 days ago and his Lasix was switched from twice a day to 3 times a day. He states he cannot take this because it makes him "put on more fluid". He denies any chest pain. Denies any cough or fever. Weight is 309 patient states his baseline is 279. His difficulty lying flat. He is confrontational and argumentative when discussing his diet or medications. He feels that his Lasix is making his swelling worse.   The history is provided by the patient.    Past Medical History  Diagnosis Date  . Hypertensive heart disease     Hypertensive heart disease with prior episodes of CHF  . Hyperlipidemia   . Hypertension   . Gastroesophageal reflux disease   . Osteoarthritis   . Peptic ulcer disease   . Morbid obesity (HCC)     /sleep apnea  . Allergic rhinitis     plus h/o asthma  . Tobacco abuse, in remission     Cigarettes discontinued in 2010  . CHF (congestive heart failure) (HCC)   . A-fib (HCC)     2015  . Asthma   . Noncompliance   . Pulmonary hypertension (HCC) 09/19/2015  . Chronic diastolic CHF (congestive heart failure) [facility])    Past Surgical History  Procedure Laterality Date  . None    . Cardiac catheterization N/A 06/14/2015    Procedure: Right/Left Heart Cath and Coronary Angiography;  Surgeon: Runell Gess, MD;  Location: [facility] INVASIVE CV LAB;  Service: Cardiovascular;  Laterality: N/A;   Family  History  Problem Relation Age of Onset  . Colon cancer Neg Hx   . Inflammatory bowel disease Neg Hx   . Liver disease Neg Hx   . Stroke Father   . Heart attack Father    Social History  Substance Use Topics  . Smoking status: Former Smoker -- 0.50 packs/day for 20 years    Types: Cigarettes    Start date: 04/26/1988    Quit date: 06/23/2007  . Smokeless tobacco: Never Used     Comment: 1 ppd former smoker  . Alcohol Use: No     Comment: no etoh since 2009    Review of Systems  Constitutional: Positive for activity change, appetite change and fatigue. Negative for fever.  HENT: Negative for congestion.   Eyes: Negative for visual disturbance.  Respiratory: Positive for shortness of breath. Negative for cough and chest tightness.   Cardiovascular: Positive for leg swelling. Negative for chest pain.  Gastrointestinal: Negative for nausea, vomiting and abdominal pain.  Genitourinary: Negative for dysuria and hematuria.  Musculoskeletal: Negative for myalgias and arthralgias.  Skin: Negative for rash.  Neurological: Negative for dizziness, weakness, light-headedness and headaches.  A complete 10 system review of systems was obtained and all systems are negative except as noted in the HPI and PMH.      Allergies  Bee venom; Aspirin; Banana; Diltiazem; Hydralazine; and Lipitor  Home Medications   Prior to Admission medications   Medication Sig  Start Date End Date Taking? Authorizing Provider  atorvastatin (LIPITOR) 80 MG tablet Take 1 tablet (80 mg total) by mouth daily at 6 PM. 09/15/15  Yes Estela Isaiah Blakes, MD  beclomethasone (QVAR) 80 MCG/ACT inhaler Inhale 2 puffs into the lungs daily.    Yes Historical Provider, MD  furosemide (LASIX) 40 MG tablet Take 1 tablet (40 mg total) by mouth 3 (three) times daily. 10/03/15  Yes Jodelle Gross, NP  losartan (COZAAR) 100 MG tablet Take 100 mg by mouth daily.  09/16/15  Yes Historical Provider, MD  metoprolol succinate  (TOPROL-XL) 50 MG 24 hr tablet Take 1 tablet (50 mg total) by mouth 2 (two) times daily. Take with or immediately following a meal. 10/03/15  Yes Jodelle Gross, NP  potassium chloride SA (K-DUR,KLOR-CON) 20 MEQ tablet Take 1 tablet (20 mEq total) by mouth 2 (two) times daily. Take with furosemide 09/22/15  Yes Elliot Cousin, MD  rivaroxaban (XARELTO) 20 MG TABS tablet Take 1 tablet (20 mg total) by mouth daily with supper. Patient taking differently: Take 20 mg by mouth daily.  09/15/15  Yes Henderson Cloud, MD  torsemide (DEMADEX) 20 MG tablet Take 20 mg by mouth 2 (two) times daily. 10/04/15  Yes Historical Provider, MD  Multiple Vitamin (MULTIVITAMIN WITH MINERALS) TABS tablet Take 1 tablet by mouth daily with breakfast. 09/22/15   Elliot Cousin, MD   BP 140/90 mmHg  Pulse 101  Temp(Src) 97.7 F (36.5 C) (Oral)  Resp 25  Ht 6' (1.829 m)  Wt 305 lb 9.6 oz (138.619 kg)  BMI 41.44 kg/m2  SpO2 98% Physical Exam  Constitutional: He is oriented to person, place, and time. He appears well-developed and well-nourished. No distress.  HENT:  Head: Normocephalic and atraumatic.  Mouth/Throat: Oropharynx is clear and moist. No oropharyngeal exudate.  Eyes: Conjunctivae and EOM are normal. Pupils are equal, round, and reactive to light.  Neck: Normal range of motion. Neck supple.  No meningismus.  Cardiovascular: Normal rate, normal heart sounds and intact distal pulses.   No murmur heard. Irregular rhythm  Pulmonary/Chest: Effort normal. No respiratory distress. He has rales.  Decreased breath sounds at bases  Abdominal: Soft. There is no tenderness. There is no rebound and no guarding.  Musculoskeletal: Normal range of motion. He exhibits edema. He exhibits no tenderness.  +2 edema to knees  Neurological: He is alert and oriented to person, place, and time. No cranial nerve deficit. He exhibits normal muscle tone. Coordination normal.  No ataxia on finger to nose bilaterally. No  pronator drift. 5/5 strength throughout. CN 2-12 intact.Equal grip strength. Sensation intact.   Skin: Skin is warm.  Psychiatric: He has a normal mood and affect. His behavior is normal.  Nursing note and vitals reviewed.   ED Course  Procedures (including critical care time) Labs Review Labs Reviewed  BASIC METABOLIC PANEL - Abnormal; Notable for the following:    Glucose, Bld 107 (*)    BUN 23 (*)    Creatinine, Ser 1.25 (*)    Calcium 8.6 (*)    All other components within normal limits  CBC - Abnormal; Notable for the following:    WBC 3.3 (*)    RBC 4.11 (*)    Hemoglobin 11.5 (*)    HCT 35.7 (*)    All other components within normal limits  PROTIME-INR - Abnormal; Notable for the following:    Prothrombin Time 15.8 (*)    All other components within normal  limits  BRAIN NATRIURETIC PEPTIDE - Abnormal; Notable for the following:    B Natriuretic Peptide 616.0 (*)    All other components within normal limits  TROPONIN I  CBC  COMPREHENSIVE METABOLIC PANEL    Imaging Review Dg Chest 2 View  10/07/2015  CLINICAL DATA:  Congestive heart failure. Shortness of breath and leg swelling. EXAM: CHEST  2 VIEW COMPARISON:  09/28/2015 FINDINGS: The heart is enlarged. Mediastinal shadows are unremarkable. There is a small amount of fluid in the fissures but no frank edema or measurable effusion. The lungs are well aerated. No significant bone finding. IMPRESSION: Small amount of fluid in the fissures. No chest radiographic evidence of pronounced CHF. Electronically Signed   By: Paulina Fusi M.D.   On: 10/07/2015 18:19   Dg Ankle 2 Views Right  10/07/2015  CLINICAL DATA:  Pain and swelling, chronic EXAM: RIGHT ANKLE - 2 VIEW COMPARISON:  Right tibia and fibula March 26, 2011 FINDINGS: Frontal lateral views were obtained. There is marked soft tissue swelling in the ankle region, most severely medially and posteriorly, stable from 2012. No fracture or joint effusion. Ankle mortise appears  intact. No appreciable joint space narrowing. There is a spur arising from the posterior calcaneus. IMPRESSION: Marked soft tissue swelling, most severe medially and posteriorly, unchanged from 2012. No fracture or appreciable joint space narrowing. Ankle mortise appears intact. Stable posterior calcaneal spur. Electronically Signed   By: Bretta Bang III M.D.   On: 10/07/2015 20:39   I have personally reviewed and evaluated these images and lab results as part of my medical decision-making.   EKG Interpretation   Date/Time:  Monday October 07 2015 17:13:52 EDT Ventricular Rate:  112 PR Interval:    QRS Duration: 88 QT Interval:  322 QTC Calculation: 439 R Axis:   58 Text Interpretation:  Atrial fibrillation with rapid ventricular response  Nonspecific T wave abnormality Abnormal ECG No significant change was  found Confirmed by Manus Gunning  MD, Ciji [place] [phone]) on 10/07/2015 5:28:14 PM      MDM   Final diagnoses:  Acute on chronic systolic congestive heart failure (HCC)   History of CHF presents with worsening weight gain, shortness of breath and leg swelling. Poor compliance with home medications and diet. No chest pain. Catheterization in December showed clean coronaries. EF 25%.  Is given IV Lasix. Chest x-ray shows small amount of fluid in the fissures. Cr stable.  On attempted ambulation, patient desats to 88%. Heart rate elevates to 160s. No chest pain.  HR controlled in the ED to 100s.   Will admit for additional diuresis due to hypoxia and dyspnea on exertion. D/w Dr. Sharl Ma.   Glynn Octave, MD 10/08/15 [phone]

## 2015-10-07 NOTE — ED Notes (Signed)
Pt with hx of CHF is here for sob and increased fluid in his legs.  No CP with this.  Pt has obviously swollen BLE.  Pt is speaking in full sentences but has some sob with increased sob with activity.  SOB has been increasing since yesterday.    Weight in triage 309.6lbs.  Pt weighs himself at home every other day and his weight yesterday was 297lbs.

## 2015-10-07 NOTE — ED Notes (Signed)
Pulse oximetry while ambulating: Beginning Pulse Oximetry at rest was 100% and dropped to 89% during ambulation. HR at rest between 130-150 and during ambulation was 162.

## 2015-10-07 NOTE — H&P (Signed)
PCP:   [name]   Chief Complaint:  Shortness of breath  HPI: 50 year old male who  has a past medical history of Hypertensive heart disease; Hyperlipidemia; Hypertension; Gastroesophageal reflux disease; Osteoarthritis; Peptic ulcer disease; Morbid obesity (HCC); Allergic rhinitis; Tobacco abuse, in remission; CHF (congestive heart failure) (HCC); A-fib (HCC); Asthma; Noncompliance; Pulmonary hypertension (HCC) (09/19/2015); and Chronic diastolic CHF (congestive heart failure) (HCC). Today came to the hospital with chief complaint of shortness of breath for past several days. Patient recently saw his cardiologist 5 days ago at that time Lasix was switched from twice a day through 3 times a day. Patient has not been compliant with taking Lasix as he felt that Lasix was making him to retain more fluid in the legs. Patient today weighs 309 pounds and states his baseline is 279 pounds. He also complains of some nausea. No vomiting. No fever. Denies any chest pain. Shortness of breath on exertion. Patient also has difficulty lying flat. In the ED he received Lasix and has been diuresing well. O2 sats on room air 97%. On ambulation dropped to 89%.   Allergies:   Allergies  Allergen Reactions  . Bee Venom Shortness Of Breath and Swelling  . Aspirin Other (See Comments)    Chewable 81 mg tablets upsets patients stomach  . Banana Diarrhea  . Diltiazem Other (See Comments)    Makes heart beat fast  . Hydralazine Other (See Comments)    Patient states it causes Tachycardia  . Lipitor [Atorvastatin] Other (See Comments)    Nose bleed      Past Medical History  Diagnosis Date  . Hypertensive heart disease     Hypertensive heart disease with prior episodes of CHF  . Hyperlipidemia   . Hypertension   . Gastroesophageal reflux disease   . Osteoarthritis   . Peptic ulcer disease   . Morbid obesity (HCC)     /sleep apnea  . Allergic rhinitis     plus h/o asthma  .  Tobacco abuse, in remission     Cigarettes discontinued in 2010  . CHF (congestive heart failure) (HCC)   . A-fib (HCC)     2015  . Asthma   . Noncompliance   . Pulmonary hypertension (HCC) 09/19/2015  . Chronic diastolic CHF (congestive heart failure) [facility])     Past Surgical History  Procedure Laterality Date  . None    . Cardiac catheterization N/A 06/14/2015    Procedure: Right/Left Heart Cath and Coronary Angiography;  Surgeon: Runell Gess, MD;  Location: [facility] INVASIVE CV LAB;  Service: Cardiovascular;  Laterality: N/A;    Prior to Admission medications   Medication Sig Start Date End Date Taking? Authorizing Provider  atorvastatin (LIPITOR) 80 MG tablet Take 1 tablet (80 mg total) by mouth daily at 6 PM. 09/15/15  Yes Estela Isaiah Blakes, MD  beclomethasone (QVAR) 80 MCG/ACT inhaler Inhale 2 puffs into the lungs daily.    Yes Historical Provider, MD  furosemide (LASIX) 40 MG tablet Take 1 tablet (40 mg total) by mouth 3 (three) times daily. 10/03/15  Yes Jodelle Gross, NP  losartan (COZAAR) 100 MG tablet Take 100 mg by mouth daily.  09/16/15  Yes Historical Provider, MD  metoprolol succinate (TOPROL-XL) 50 MG 24 hr tablet Take 1 tablet (50 mg total) by mouth 2 (two) times daily. Take with or immediately following a meal. 10/03/15  Yes Jodelle Gross, NP  potassium chloride SA (K-DUR,KLOR-CON) 20 MEQ tablet Take  1 tablet (20 mEq total) by mouth 2 (two) times daily. Take with furosemide 09/22/15  Yes Elliot Cousin, MD  rivaroxaban (XARELTO) 20 MG TABS tablet Take 1 tablet (20 mg total) by mouth daily with supper. Patient taking differently: Take 20 mg by mouth daily.  09/15/15  Yes Henderson Cloud, MD  torsemide (DEMADEX) 20 MG tablet Take 20 mg by mouth 2 (two) times daily. 10/04/15  Yes Historical Provider, MD  Multiple Vitamin (MULTIVITAMIN WITH MINERALS) TABS tablet Take 1 tablet by mouth daily with breakfast. 09/22/15   Elliot Cousin, MD    Social History:   reports that he quit smoking about 8 years ago. His smoking use included Cigarettes. He started smoking about 27 years ago. He has a 10 pack-year smoking history. He has never used smokeless tobacco. He reports that he does not drink alcohol or use illicit drugs.  Family History  Problem Relation Age of Onset  . Colon cancer Neg Hx   . Inflammatory bowel disease Neg Hx   . Liver disease Neg Hx   . Stroke Father   . Heart attack Father     Ceasar Mons Weights   10/07/15 1728  Weight: 136.079 kg (300 lb)    All the positives are listed in BOLD  Review of Systems:  HEENT: Headache, blurred vision, runny nose, sore throat Neck: Hypothyroidism, hyperthyroidism,,lymphadenopathy Chest : Shortness of breath, history of COPD, Asthma Heart : Chest pain, history of coronary arterey disease GI:  Nausea, vomiting, diarrhea, constipation, GERD GU: Dysuria, urgency, frequency of urination, hematuria Neuro: Stroke, seizures, syncope Psych: Depression, anxiety, hallucinations   Physical Exam: Blood pressure 162/126, pulse 103, temperature 98.5 F (36.9 C), temperature source Oral, resp. rate 22, height 6' (1.829 m), weight 136.079 kg (300 lb), SpO2 100 %. Constitutional:   Patient is a well-developed and well-nourished  male in no acute distress and cooperative with exam. Head: Normocephalic and atraumatic Mouth: Mucus membranes moist Eyes: PERRL, EOMI, conjunctivae normal Neck: Supple, No Thyromegaly Cardiovascular: RRR, S1 normal, S2 normal Pulmonary/Chest: CTAB, no wheezes, rales, or rhonchi Abdominal: Soft. Non-tender, non-distended, bowel sounds are normal, no masses, organomegaly, or guarding present.  Neurological: A&O x3, Strength is normal and symmetric bilaterally, cranial nerve II-XII are grossly intact, no focal motor deficit, sensory intact to light touch bilaterally.  Extremities : Bilateral 2+ pitting edema. Also has increased swelling at right ankle.  Labs on Admission:  Basic  Metabolic Panel:  Recent Labs Lab 10/07/15 1741  NA 143  K 3.7  CL 110  CO2 25  GLUCOSE 107*  BUN 23*  CREATININE 1.25*  CALCIUM 8.6*   CBC:  Recent Labs Lab 10/07/15 1741  WBC 3.3*  HGB 11.5*  HCT 35.7*  MCV 86.9  PLT 195   Cardiac Enzymes:  Recent Labs Lab 10/07/15 1741  TROPONINI 0.03    BNP (last 3 results)  Recent Labs  09/26/15 0611 09/28/15 1119 10/07/15 1741  BNP 620.0* 845.7* 616.0*     Radiological Exams on Admission: Dg Chest 2 View  10/07/2015  CLINICAL DATA:  Congestive heart failure. Shortness of breath and leg swelling. EXAM: CHEST  2 VIEW COMPARISON:  09/28/2015 FINDINGS: The heart is enlarged. Mediastinal shadows are unremarkable. There is a small amount of fluid in the fissures but no frank edema or measurable effusion. The lungs are well aerated. No significant bone finding. IMPRESSION: Small amount of fluid in the fissures. No chest radiographic evidence of pronounced CHF. Electronically Signed   By: Loraine Leriche  Shogry M.D.   On: 10/07/2015 18:19    EKG: Independently reviewed. Atrial fibrillation with rapid response   Assessment/Plan Active Problems:   Essential hypertension   Sleep apnea   Acute on chronic diastolic CHF (congestive heart failure) (HCC)   Chronic atrial fibrillation (HCC)   Pulmonary hypertension (HCC)   Systolic CHF (HCC)   Acute exacerbation of CHF (congestive heart failure) (HCC)   Acute on chronic combined systolic and diastolic heart failure Patient's last ejection fraction was 20-25%, has a history of diastolic heart failure. Will start Lasix 40 mg IV every 12 hours from tomorrow morning. Patient received 60 mg IV Lasix in the ED. Monitor intake and output.  Atrial fibrillation Continue anticoagulation with Xarelto Chads VASC score is 2 Continue metoprolol  History of obstructive sleep apnea Patient is not on C PAP at this time  Right ankle swelling Patient has swelling of right ankle for past 3-4  years. Will obtain x-ray of the ankle.  Hypertension Continue metoprolol, losartan  DVT prophylaxis Patient on Xarelto  Code status: Full code  Family discussion: No family at bedside   Time Spent on Admission: 60 min  [facility] Pager: [phone] 10/07/2015, 8:23 PM  If 7PM-7AM, please contact night-coverage  [email or web address]  Password [email or web address]

## 2015-10-08 DIAGNOSIS — I482 Chronic atrial fibrillation: Secondary | ICD-10-CM | POA: Diagnosis not present

## 2015-10-08 DIAGNOSIS — J9601 Acute respiratory failure with hypoxia: Secondary | ICD-10-CM | POA: Diagnosis not present

## 2015-10-08 DIAGNOSIS — I5043 Acute on chronic combined systolic (congestive) and diastolic (congestive) heart failure: Secondary | ICD-10-CM

## 2015-10-08 DIAGNOSIS — I472 Ventricular tachycardia: Secondary | ICD-10-CM | POA: Diagnosis not present

## 2015-10-08 DIAGNOSIS — Z9119 Patient's noncompliance with other medical treatment and regimen: Secondary | ICD-10-CM

## 2015-10-08 DIAGNOSIS — Z9114 Patient's other noncompliance with medication regimen: Secondary | ICD-10-CM | POA: Diagnosis not present

## 2015-10-08 LAB — CBC
HCT: 38.9 % — ABNORMAL LOW (ref 39.0–52.0)
Hemoglobin: 12.7 g/dL — ABNORMAL LOW (ref 13.0–17.0)
MCH: 28.7 pg (ref 26.0–34.0)
MCHC: 32.6 g/dL (ref 30.0–36.0)
MCV: 88 fL (ref 78.0–100.0)
PLATELETS: 226 10*3/uL (ref 150–400)
RBC: 4.42 MIL/uL (ref 4.22–5.81)
RDW: 14.8 % (ref 11.5–15.5)
WBC: 4 10*3/uL (ref 4.0–10.5)

## 2015-10-08 LAB — COMPREHENSIVE METABOLIC PANEL
ALT: 27 U/L (ref 17–63)
ANION GAP: 10 (ref 5–15)
AST: 28 U/L (ref 15–41)
Albumin: 3.7 g/dL (ref 3.5–5.0)
Alkaline Phosphatase: 66 U/L (ref 38–126)
BUN: 21 mg/dL — ABNORMAL HIGH (ref 6–20)
CHLORIDE: 108 mmol/L (ref 101–111)
CO2: 27 mmol/L (ref 22–32)
CREATININE: 1.17 mg/dL (ref 0.61–1.24)
Calcium: 8.8 mg/dL — ABNORMAL LOW (ref 8.9–10.3)
Glucose, Bld: 94 mg/dL (ref 65–99)
Potassium: 3.4 mmol/L — ABNORMAL LOW (ref 3.5–5.1)
Sodium: 145 mmol/L (ref 135–145)
Total Bilirubin: 1.6 mg/dL — ABNORMAL HIGH (ref 0.3–1.2)
Total Protein: 6.8 g/dL (ref 6.5–8.1)

## 2015-10-08 MED ORDER — SPIRONOLACTONE 25 MG PO TABS
12.5000 mg | ORAL_TABLET | Freq: Every day | ORAL | Status: DC
  Administered 2015-10-08 – 2015-10-15 (×3): 12.5 mg via ORAL
  Filled 2015-10-08 (×5): qty 1

## 2015-10-08 NOTE — Progress Notes (Signed)
Initial Nutrition Assessment  DOCUMENTATION CODES:   Morbid obesity  INTERVENTION:  -Continue to monitor nutrition needs -Education for CHF  -Handouts included "Heart Failure Nutrition Therapy" and "Weight Loss Tips"   NUTRITION DIAGNOSIS:   Food and nutrition related knowledge deficit related to limited prior education as evidenced by per patient/family report.  GOAL:   Patient will meet greater than or equal to 90% of their needs  MONITOR:   PO intake, Labs, Weight trends, Skin, I & O's  REASON FOR ASSESSMENT:   Consult Diet education  ASSESSMENT:   50 year old male who  has a past medical history of Hypertensive heart disease; Hyperlipidemia; Hypertension; Gastroesophageal reflux disease; Osteoarthritis; Peptic ulcer disease; Morbid obesity (HCC); Allergic rhinitis; Tobacco abuse, in remission; CHF (congestive heart failure) (HCC); A-fib (HCC); Asthma; Noncompliance; Pulmonary hypertension (HCC) (09/19/2015); and Chronic diastolic CHF (congestive heart failure) (HCC).  Pt seen for consult for HF diet education. Pt had no prior knowledge of nutrition therapy for heart failure. Pt given "Heart Failure Nutrition Therapy" and "Weight Loss Tips" from the Academy of [employer] Dietetics. Intern discussed low sodium diet, recommended low sodium vs high sodium foods, and weight loss tips. Expect fair compliance.   Pt diet recall reports eating TID with snacks. Pt does not take any nutritional supplements at home. Pt has not experienced any recent weight loss. Pt denies N/V and abdominal pain associated with eating. Pt has 100% meal completion.   NFPE: No muscle depletion, no fat depletion, unable to mild edema. Pt on lasix and pt reports feeling like fluid has depleted during hospital stay.   Labs reviewed; K 3.4, BUN 21, Ca 8.5  Meds reviewed; lasix 40 mg every 12 hrs, KCl 20 mEq  Diet Order:  Diet Heart Room service appropriate?: Yes; Fluid consistency:: Thin  Skin:   Reviewed, no issues  Last BM:  4/9  Height:   Ht Readings from Last 1 Encounters:  10/07/15 6' (1.829 m)    Weight:   Wt Readings from Last 1 Encounters:  10/08/15 305 lb 12.8 oz (138.71 kg)    Ideal Body Weight:  80.9 kg  BMI:  Body mass index is 41.46 kg/(m^2).  Estimated Nutritional Needs:   Kcal:  1800-2000 (13-15 kcals/kg)  Protein:  90-100 g (1 g/kg IBW)  Fluid:  per MD  EDUCATION NEEDS:   Education needs addressed  Beryle Quant, MS [facility] Dietetic Intern Pager [phone]

## 2015-10-08 NOTE — Progress Notes (Addendum)
PROGRESS NOTE  [name]:[id number] DOB: 01/27/1966 DOA: 10/07/2015 PCP: [name] Outpatient Specialists:  Brief Narrative: 30 yom PMH nonischemic cardiomyopathy, chronic systolic/diastolic congestive heart failure and dietary/medication noncompliance, belligerence who presented with shortness of breath and swelling. Noncompliant with Lasix. Cardiology note 4/13 noted significant noncompliance with diet. Admitted for acute on chronic combined systolic and diastolic congestive heart failure.  Assessment/Plan: 1. Acute on chronic combined systolic and diastolic heart failure. Seems to be improving. He still has significant volume overload. Echo 09/30/2015 EF of 20-25% with diffuse hypokinesis. Diastolic function could not be evaluated secondary to atrial fibrillation.  2. Acute hypoxic respiratory failure. SPO2 borderline 88-89 percent on room air. Improved. 3. Nonischemic cardiomyopathy secondary to hypertensive heart disease, noncompliance. 4. Atrial fibrillation, stable. Anticoagulated with Xarelto. Continue metoprolol.  5. OSA. Not on CPAP.  6. HTN, stable.   7. HLD, continue statin.  8. Noncompliance with diet and medication regimen. 6 admissions in the last 6 months. High risk for rehospitalization. 9. Morbid obesity   Appears stable. Continue IV diuresis  Nutrition consultation  Home health RN for disease management  [facility] referral  DVT prophylaxis: Xarelto Code Status: Full Family Communication: none Disposition Plan: home 3 days  Brendia Sacks, MD  [facility] Direct contact:  --Via amion app OR  --[email or web address]; password [email or web address] and click  7PM-7AM contact night coverage as above 10/08/2015, 3:58 PM    Consultants:  Cardiology  Procedures:  None  Antimicrobials:  None  HPI/Subjective: Feels okay. Breathing better.  Objective: Filed Vitals:   10/08/15 0640 10/08/15 0752 10/08/15 1117 10/08/15 1300  BP: 134/100   144/98    Pulse: 65   102  Temp: 98 F (36.7 C)   97.9 F (36.6 C)  TempSrc: Oral   Oral  Resp: 22   18  Height:      Weight: 138.71 kg (305 lb 12.8 oz)     SpO2: 99% 91% 94% 100%    Intake/Output Summary (Last 24 hours) at 10/08/15 1558 Last data filed at 10/08/15 1200  Gross per 24 hour  Intake    480 ml  Output   2800 ml  Net  -2320 ml     Filed Weights   10/07/15 1728 10/07/15 2042 10/08/15 0640  Weight: 136.079 kg (300 lb) 138.619 kg (305 lb 9.6 oz) 138.71 kg (305 lb 12.8 oz)    Exam:     Afebrile, VSS, not hypoxic  Constitutional:  . Appears calm and comfortable Respiratory:  . CTA bilaterally, no w/r/r.  . Respiratory effort normal. No retractions or accessory muscle use Cardiovascular:  . RRR, no m/r/g . 3+ bilateral LE extremity edema   . Telemetry ST, NSVT Psychiatric:  o Odd affect  I have personally reviewed following labs and imaging studies:  UOP 2500  Potassium 3.4, CMP otherwise unremarkable  CBC unremarkable  Scheduled Meds: . atorvastatin  80 mg Oral q1800  . budesonide (PULMICORT) nebulizer solution  0.25 mg Nebulization BID  . furosemide  40 mg Intravenous Q12H  . losartan  100 mg Oral Daily  . metoprolol succinate  50 mg Oral BID  . potassium chloride SA  20 mEq Oral BID  . rivaroxaban  20 mg Oral Q supper  . sodium chloride flush  3 mL Intravenous Q12H  . sodium chloride flush  3 mL Intravenous Q12H  . spironolactone  12.5 mg Oral Daily   Continuous Infusions:   Principal Problem:   Acute on chronic combined systolic  and diastolic heart failure (HCC) Active Problems:   OBESITY, MORBID   Essential hypertension   Sleep apnea   Noncompliance with medication regimen   Acute respiratory failure with hypoxia (HCC)   Chronic atrial fibrillation (HCC)   Pulmonary hypertension (HCC)     Time spent 15 minutes    By signing my name below, I, Zadie Cleverly attest that this documentation has been prepared under the direction and in  the presence of Brendia Sacks, MD Electronically signed: Zadie Cleverly  10/08/2015     I personally performed the services described in this documentation. All medical record entries made by the scribe were at my direction. I have reviewed the chart and agree that the record reflects my personal performance and is accurate and complete. Brendia Sacks, MD

## 2015-10-08 NOTE — Progress Notes (Signed)
MD text about patient having a 14 beat run of V-Tach, patient without complaint, vital signs wnl.

## 2015-10-08 NOTE — Care Management Obs Status (Signed)
MEDICARE OBSERVATION STATUS NOTIFICATION   Patient Details  Name: [name] MRN: [id number] Date of Birth: [birth year]   Medicare Observation Status Notification Given:  Yes    [name], RN 10/08/2015, 1:30 PM

## 2015-10-08 NOTE — Consult Note (Signed)
CARDIOLOGY CONSULT NOTE   Patient ID: [name] MRN: [id number] DOB/AGE: 48-Jun-1967 48 y.o.  Admit Date: 10/07/2015 Referring Physician: TR[facility]-Goodrich MD Primary Physician: [name] Consulting Cardiologist: Nona Dell MD Primary Cardiologist: Prentice Docker MD Reason for Consultation: Acute on Chronic Mixed Systolic and Diastolic CHF  Clinical Summary Mr. Laspada is a 50 y.o.male with known history of nonischemic cardiomyopathy, chronic diastolic CHF, atrial fibrillation, CHADS VASC Score of 2, hyperlipidemia, pulmonary hypertension, history of medical and dietary noncompliant with frequent hospitalizations for decompensated heart failure. The patient was recently seen in the office on 10/03/2015. He was not taking his diuretic as directed, as he was convinced that this was causing him to swell. A significant amount of time going over his medications and diet was had during that office visit. His Lasix was changed to torsemide for better bioavailability and he was to follow-up with labs and evaluation of his response to medical treatment. The patient was reluctant to continue medical treatment, and actually stated that he would probably be in the hospital in a couple of days.  The patient did not begin torsemide as directed, continue to take Lasix but also eating a lot of salty foods, he stated that his legs continued to swell even though he took the Lasix. He states that he cannot afford to eat healthy, but can afford to eat out a lot. He states that he eats a lot of hot dogs, fast food.   Unfortunately, the patient presented to the emergency room hypertensive and in decompensated heart failure. Patient's blood pressure was 175/110, heart rate 96, he was afebrile. Hemoglobin was 11.5, hematocrit was 35.7. Troponin was negative at 0.04. INR 1.25. PT, 15.8. BNP 616. Chest x-ray revealed a small amount of fluid in the fissures but no clear graphic evidence of CHF  or pneumonia. The patient was treated with IV Lasix 60 mg in the ER and nitroglycerin 2% ointment. Since admission the patient is diuresed 2.26 L of fluid.   Allergies  Allergen Reactions  . Bee Venom Shortness Of Breath and Swelling  . Aspirin Other (See Comments)    Chewable 81 mg tablets upsets patients stomach  . Banana Diarrhea  . Diltiazem Other (See Comments)    Makes heart beat fast  . Hydralazine Other (See Comments)    Patient states it causes Tachycardia  . Lipitor [Atorvastatin] Other (See Comments)    Nose bleed    Medications Scheduled Medications: . atorvastatin  80 mg Oral q1800  . budesonide (PULMICORT) nebulizer solution  0.25 mg Nebulization BID  . furosemide  40 mg Intravenous Q12H  . losartan  100 mg Oral Daily  . metoprolol succinate  50 mg Oral BID  . potassium chloride SA  20 mEq Oral BID  . rivaroxaban  20 mg Oral Q supper  . sodium chloride flush  3 mL Intravenous Q12H  . sodium chloride flush  3 mL Intravenous Q12H    PRN Medications: sodium chloride, ondansetron **OR** ondansetron (ZOFRAN) IV, sodium chloride flush   Past Medical History  Diagnosis Date  . Hypertensive heart disease     Hypertensive heart disease with prior episodes of CHF  . Hyperlipidemia   . Hypertension   . Gastroesophageal reflux disease   . Osteoarthritis   . Peptic ulcer disease   . Morbid obesity (HCC)     /sleep apnea  . Allergic rhinitis     plus h/o asthma  . Tobacco abuse, in remission  Cigarettes discontinued in 2010  . CHF (congestive heart failure) (HCC)   . A-fib (HCC)     2015  . Asthma   . Noncompliance   . Pulmonary hypertension (HCC) 09/19/2015  . Chronic diastolic CHF (congestive heart failure) [facility])     Past Surgical History  Procedure Laterality Date  . None    . Cardiac catheterization N/A 06/14/2015    Procedure: Right/Left Heart Cath and Coronary Angiography;  Surgeon: Runell Gess, MD;  Location: [facility] INVASIVE CV LAB;  Service:  Cardiovascular;  Laterality: N/A;    Family History  Problem Relation Age of Onset  . Colon cancer Neg Hx   . Inflammatory bowel disease Neg Hx   . Liver disease Neg Hx   . Stroke Father   . Heart attack Father     Social History Mr. Terpstra reports that he quit smoking about 8 years ago. His smoking use included Cigarettes. He started smoking about 27 years ago. He has a 10 pack-year smoking history. He has never used smokeless tobacco. Mr. Huntsman reports that he does not drink alcohol.  Review of Systems Complete review of systems are found to be negative unless outlined in H&P above.  Physical Examination Blood pressure 134/100, pulse 65, temperature 98 F (36.7 C), temperature source Oral, resp. rate 22, height 6' (1.829 m), weight 305 lb 12.8 oz (138.71 kg), SpO2 91 %.  Intake/Output Summary (Last 24 hours) at 10/08/15 1123 Last data filed at 10/08/15 0800  Gross per 24 hour  Intake    240 ml  Output   2500 ml  Net  -2260 ml    Telemetry: Atrial fibrillation.  GEN: Obese male, no acute distress  HEENT: Conjunctiva and lids normal, oropharynx clear with moist mucosa. Wearing sun glasses.  Neck: Supple, no elevated JVP or carotid bruits, no thyromegaly. Lungs: Clear to auscultation, nonlabored breathing at rest. Cardiac: Irregular rate and rhythm, no S3 or significant systolic murmur, no pericardial rub. Abdomen: Soft, nontender, bowel sounds present, no guarding or rebound. Extremities: 2-3+ pitting edema, distal pulses 2+. Skin: Warm and dry. Musculoskeletal: No kyphosis. Neuropsychiatric: Alert and oriented x3, affect grossly appropriate.  Prior Cardiac Testing/Procedures 1. Right and Left Cardiac Catheterization 06/14/2015 [facility] IMPRESSION:Mr. Broady has normal coronary arteries with pulmonary hypertension and elevated LVEDP. He has diastolic heart failure and hypertensive heart disease.  Right Heart Pressures Elevated LV EDP consistent with volume overload. Right  atrial pressure-15/16, mean equals 14 Right ventricular pressure-67/4 Pulmonary artery pressure-75/30, mean equals 50 Pulmonary capillary wedge pressure-equals 27, V-wave equals 42, mean equals 33 LVEDP was 22   Echocardiogram 09/30/2015 Left ventricle: The cavity size was normal. There was severe  concentric hypertrophy. Systolic function was severely reduced.  The estimated ejection fraction was in the range of 20% to 25%.  Diffuse hypokinesis. The study is not technically sufficient to  allow evaluation of LV diastolic function. - Mitral valve: Mildly thickened leaflets . There was trivial  regurgitation. - Left atrium: Moderately dilated at 47 ml/m2. - Right ventricle: The cavity size was mildly dilated. Systolic  function was normal. - Right atrium: Severely dilated at 29 cm2. - Tricuspid valve: There was mild regurgitation. - Pulmonary arteries: PA peak pressure: 49 mm Hg (S). - Inferior vena cava: The vessel was dilated. The respirophasic  diameter changes were blunted (< 50%), consistent with elevated  central venous pressure.  Impressions:  Compared to a prior study in 05/2015, the LVEF has declined to  20-25% with global hypokinesis. A-fib  is noted to be present.  Lab Results  Basic Metabolic Panel:  Recent Labs Lab 10/07/15 1741 10/08/15 0615  NA 143 145  K 3.7 3.4*  CL 110 108  CO2 25 27  GLUCOSE 107* 94  BUN 23* 21*  CREATININE 1.25* 1.17  CALCIUM 8.6* 8.8*    Liver Function Tests:  Recent Labs Lab 10/08/15 0615  AST 28  ALT 27  ALKPHOS 66  BILITOT 1.6*  PROT 6.8  ALBUMIN 3.7    CBC:  Recent Labs Lab 10/07/15 1741 10/08/15 0615  WBC 3.3* 4.0  HGB 11.5* 12.7*  HCT 35.7* 38.9*  MCV 86.9 88.0  PLT 195 226    Cardiac Enzymes:  Recent Labs Lab 10/07/15 1741  TROPONINI 0.03    Radiology: Dg Chest 2 View  10/07/2015  CLINICAL DATA:  Congestive heart failure. Shortness of breath and leg swelling. EXAM: CHEST  2 VIEW  COMPARISON:  09/28/2015 FINDINGS: The heart is enlarged. Mediastinal shadows are unremarkable. There is a small amount of fluid in the fissures but no frank edema or measurable effusion. The lungs are well aerated. No significant bone finding. IMPRESSION: Small amount of fluid in the fissures. No chest radiographic evidence of pronounced CHF. Electronically Signed   By: Paulina Fusi M.D.   On: 10/07/2015 18:19   Dg Ankle 2 Views Right  10/07/2015  CLINICAL DATA:  Pain and swelling, chronic EXAM: RIGHT ANKLE - 2 VIEW COMPARISON:  Right tibia and fibula March 26, 2011 FINDINGS: Frontal lateral views were obtained. There is marked soft tissue swelling in the ankle region, most severely medially and posteriorly, stable from 2012. No fracture or joint effusion. Ankle mortise appears intact. No appreciable joint space narrowing. There is a spur arising from the posterior calcaneus. IMPRESSION: Marked soft tissue swelling, most severe medially and posteriorly, unchanged from 2012. No fracture or appreciable joint space narrowing. Ankle mortise appears intact. Stable posterior calcaneal spur. Electronically Signed   By: Bretta Bang III M.D.   On: 10/07/2015 20:39    ECG: Atrial fibrillation, rate of 112 bpm.   Impression and Recommendations  1. Acute on Chronic Systolic and Diastolic CHF. Multifactorial, despite significant amount of time explaining low-sodium diet and medication compliance, the patient has continued to be noncompliant concerning medication regimen and eating habits. He states he cannot afford to eat healthy. He believes that the Lasix is causing him to have edema. He has not switched over to torsemide as directed. He is diuresing with IV Lasix and controlled diet here in the hospital. Continue IV diuresis to dry weight. Most recent recorded dry weight was 279 pounds, he is currently 305 pounds. Suggest Case Management involvement.  2 . Nonischemic cardiomyopathy: Most recent  echocardiogram revealed an EF of 20-25%. He is currently not a candidate for ICD implantation due to noncompliance issues. He remains on metoprolol, ARB, and potassium replacement. Can consider spironolactone.  3. Atrial fibrillation: . Heart rate is controlled here in the hospital, review of telemetry did reveal some rapid rates. He remains on Xarelto. Elevated heart rate and medical noncompliance may be contributing to recurrent CHF in the setting.  4. Hypertensive urgency: Blood pressure much better since hospitalization and diuresis. Medical compliance continues to be an issue here, along with his diet. Home meds include losartan 100 mg daily, metoprolol 50 mg daily,it is noted that while he is here in the hospital, and receiving prescribed medications he is well controlled.   5. Chronic Non-adherence to medical regimen: This is  a difficult situation and recurring problem despite education and repeated discussions.  Signed: Bettey Mare. Lawrence NP [facility]  10/08/2015, 11:23 AM Co-Sign MD   Attending note:  Patient seen and examined. Records reviewed and above note by Ms. Lawrence NP modified. Patient presents with acute on chronic combined heart failure in the face of medication and dietary noncompliance (a recurrent issue). He was just recently seen in the office on April 13 by Ms. Lawrence NP - did not comply with medication adjustments or recommendations.  On examination he is in no distress, heart rate is in the 60s in atrial fibrillation by telemetry, blood pressure 130 over 100 range. He is morbidly obese, weight 305 pounds. Increased JVP noted, cardiac exam with irregularly irregular rhythm but no gallop, chronic appearing leg edema. Lab work reviewed showing creatinine 1.2, hemoglobin 12.7, troponin I 0.03, BNP 616. ECG shows atrial fibrillation with nonspecific ST changes.  Acute on chronic combined heart failure, complicated by medication and dietary noncompliance. LVEF 20-25% range by  most recent assessment. Would continue Toprol-XL and Cozaar as well as Xarelto. Agree with transition to IV Lasix, he has diuresed nearly 2-1/2 L so far and still has more to go. Replete potassium. Add Aldactone. Suggest case manager consultation. We will follow with you.  Jonelle Sidle, M.D., F.A.C.C.

## 2015-10-09 DIAGNOSIS — I110 Hypertensive heart disease with heart failure: Secondary | ICD-10-CM | POA: Diagnosis present

## 2015-10-09 DIAGNOSIS — Z87891 Personal history of nicotine dependence: Secondary | ICD-10-CM | POA: Diagnosis not present

## 2015-10-09 DIAGNOSIS — I43 Cardiomyopathy in diseases classified elsewhere: Secondary | ICD-10-CM | POA: Diagnosis present

## 2015-10-09 DIAGNOSIS — I5043 Acute on chronic combined systolic (congestive) and diastolic (congestive) heart failure: Secondary | ICD-10-CM | POA: Diagnosis not present

## 2015-10-09 DIAGNOSIS — E785 Hyperlipidemia, unspecified: Secondary | ICD-10-CM | POA: Diagnosis present

## 2015-10-09 DIAGNOSIS — G4730 Sleep apnea, unspecified: Secondary | ICD-10-CM | POA: Diagnosis present

## 2015-10-09 DIAGNOSIS — T502X5A Adverse effect of carbonic-anhydrase inhibitors, benzothiadiazides and other diuretics, initial encounter: Secondary | ICD-10-CM | POA: Diagnosis present

## 2015-10-09 DIAGNOSIS — I10 Essential (primary) hypertension: Secondary | ICD-10-CM | POA: Diagnosis not present

## 2015-10-09 DIAGNOSIS — I482 Chronic atrial fibrillation: Secondary | ICD-10-CM | POA: Diagnosis not present

## 2015-10-09 DIAGNOSIS — J45909 Unspecified asthma, uncomplicated: Secondary | ICD-10-CM | POA: Diagnosis present

## 2015-10-09 DIAGNOSIS — K219 Gastro-esophageal reflux disease without esophagitis: Secondary | ICD-10-CM | POA: Diagnosis present

## 2015-10-09 DIAGNOSIS — I472 Ventricular tachycardia: Secondary | ICD-10-CM | POA: Diagnosis not present

## 2015-10-09 DIAGNOSIS — I160 Hypertensive urgency: Secondary | ICD-10-CM | POA: Diagnosis present

## 2015-10-09 DIAGNOSIS — Z7901 Long term (current) use of anticoagulants: Secondary | ICD-10-CM | POA: Diagnosis not present

## 2015-10-09 DIAGNOSIS — Z9114 Patient's other noncompliance with medication regimen: Secondary | ICD-10-CM | POA: Diagnosis not present

## 2015-10-09 DIAGNOSIS — I272 Other secondary pulmonary hypertension: Secondary | ICD-10-CM | POA: Diagnosis present

## 2015-10-09 DIAGNOSIS — Z79899 Other long term (current) drug therapy: Secondary | ICD-10-CM | POA: Diagnosis not present

## 2015-10-09 DIAGNOSIS — N179 Acute kidney failure, unspecified: Secondary | ICD-10-CM | POA: Diagnosis present

## 2015-10-09 DIAGNOSIS — J9601 Acute respiratory failure with hypoxia: Secondary | ICD-10-CM | POA: Diagnosis not present

## 2015-10-09 DIAGNOSIS — Z9111 Patient's noncompliance with dietary regimen: Secondary | ICD-10-CM | POA: Diagnosis not present

## 2015-10-09 DIAGNOSIS — Z6841 Body Mass Index (BMI) 40.0 and over, adult: Secondary | ICD-10-CM | POA: Diagnosis not present

## 2015-10-09 DIAGNOSIS — I509 Heart failure, unspecified: Secondary | ICD-10-CM

## 2015-10-09 DIAGNOSIS — R609 Edema, unspecified: Secondary | ICD-10-CM | POA: Diagnosis present

## 2015-10-09 LAB — BASIC METABOLIC PANEL
ANION GAP: 5 (ref 5–15)
BUN: 22 mg/dL — ABNORMAL HIGH (ref 6–20)
CALCIUM: 8.6 mg/dL — AB (ref 8.9–10.3)
CO2: 27 mmol/L (ref 22–32)
CREATININE: 1.1 mg/dL (ref 0.61–1.24)
Chloride: 109 mmol/L (ref 101–111)
GFR calc non Af Amer: >60 mL/min (ref >60)
Glucose, Bld: 91 mg/dL (ref 65–99)
Potassium: 3.6 mmol/L (ref 3.5–5.1)
SODIUM: 141 mmol/L (ref 135–145)

## 2015-10-09 MED ORDER — METOPROLOL SUCCINATE ER 50 MG PO TB24
75.0000 mg | ORAL_TABLET | Freq: Two times a day (BID) | ORAL | Status: DC
  Administered 2015-10-09 – 2015-10-15 (×12): 75 mg via ORAL
  Filled 2015-10-09 (×14): qty 1

## 2015-10-09 NOTE — Progress Notes (Signed)
Primary cardiologist: Dr. Prentice Docker  Seen for followup: Acute on chronic combined heart failure  Subjective:    Sitting on bed this morning having breathing treatment. Still short of breath with activity. No chest pain or palpitations. Leg edema slowly improving.  Objective:   Temp:  [97.5 F (36.4 C)-98.3 F (36.8 C)] 97.5 F (36.4 C) (04/19 0415) Pulse Rate:  [67-102] 67 (04/19 0415) Resp:  [18-20] 18 (04/19 0415) BP: (141-157)/(88-98) 141/97 mmHg (04/19 0415) SpO2:  [94 %-100 %] 97 % (04/19 0754) Weight:  [298 lb 4.8 oz (135.308 kg)] 298 lb 4.8 oz (135.308 kg) (04/19 0415)    Filed Weights   10/07/15 2042 10/08/15 0640 10/09/15 0415  Weight: 305 lb 9.6 oz (138.619 kg) 305 lb 12.8 oz (138.71 kg) 298 lb 4.8 oz (135.308 kg)    Intake/Output Summary (Last 24 hours) at 10/09/15 0801 Last data filed at 10/09/15 0415  Gross per 24 hour  Intake    480 ml  Output   2600 ml  Net  -2120 ml    Telemetry: Atrial fibrillation, intermittent bursts of RVR, also PVCs and NSVT.  Exam:  General: Morbidly obese male, no distress.  Lungs: Diminished breath sounds with prolonged expiratory phase.  Cardiac: Indistinct PMI with irregularly irregular rhythm.  Abdomen: Obese, nontender.  Extremities: Chronic appearing edema.  Lab Results:  Basic Metabolic Panel:  Recent Labs Lab 10/07/15 1741 10/08/15 0615 10/09/15 0535  NA 143 145 141  K 3.7 3.4* 3.6  CL 110 108 109  CO2 25 27 27   GLUCOSE 107* 94 91  BUN 23* 21* 22*  CREATININE 1.25* 1.17 1.10  CALCIUM 8.6* 8.8* 8.6*    Liver Function Tests:  Recent Labs Lab 10/08/15 0615  AST 28  ALT 27  ALKPHOS 66  BILITOT 1.6*  PROT 6.8  ALBUMIN 3.7     CBC:  Recent Labs Lab 10/07/15 1741 10/08/15 0615  WBC 3.3* 4.0  HGB 11.5* 12.7*  HCT 35.7* 38.9*  MCV 86.9 88.0  PLT 195 226    Cardiac Enzymes:  Recent Labs Lab 10/07/15 1741  TROPONINI 0.03    Coagulation:  Recent Labs Lab  10/07/15 1741  INR 1.25    Medications:   Scheduled Medications: . atorvastatin  80 mg Oral q1800  . budesonide (PULMICORT) nebulizer solution  0.25 mg Nebulization BID  . furosemide  40 mg Intravenous Q12H  . losartan  100 mg Oral Daily  . metoprolol succinate  50 mg Oral BID  . potassium chloride SA  20 mEq Oral BID  . rivaroxaban  20 mg Oral Q supper  . sodium chloride flush  3 mL Intravenous Q12H  . sodium chloride flush  3 mL Intravenous Q12H  . spironolactone  12.5 mg Oral Daily      PRN Medications:  sodium chloride, ondansetron **OR** ondansetron (ZOFRAN) IV, sodium chloride flush   Assessment:   1. Acute on chronic combined heart failure with volume overload. He continues to diurese on IV Lasix, additional 1800 cc in the last 24 hours. Weight is coming down but not yet at baseline (around 280 pounds previously).  2. Noncompliance, concluding significantly to problem #1. Patient cites financials constraints, and also seems to have limited grasp of his overall medical condition.  3. Chronic atrial fibrillation, on Xarelto for stroke prophylaxis.  4. Essential hypertension.  5. Nonischemic cardiomyopathy with LVEF 20-25%. He is not a good candidate for advanced therapies or ICD this time.  Plan/Discussion:  Discussed with patient. Current regimen includes Toprol-XL, Cozaar, Aldactone, Xarelto, IV Lasix, and potassium supplements. We will increase beta blocker dose to provide better heart rate control of atrial fibrillation and in light of PVCs with NSVT. Continue IV diuresis.   Jonelle Sidle, M.D., F.A.C.C.

## 2015-10-09 NOTE — Care Management Note (Signed)
Case Management Note  Patient Details  Name: [name] MRN: [id number] Date of Birth: 03-14-1966  Subjective/Objective:                  Pt is from home, lives with mother and children. Pt is ind with ADL's. Pt admitted with CHF and has had multiple admission recently. Pt reports dietary non-compliance but says he is able to and does have all Rx filled and takes them like he is supposed to. Pt reports weighing himself daily and reporting to the MD when his weight goes up. Pt is agreeable to [facility] RN at DC to provide education and monitor compliance. Pt has chosen [facility] from [facility] agency list. Alroy Bailiff, of [facility], aware of referral and will obtain pt info from chart. Pt's PCP will be closing in June and appointment made with new PCP, Dr. Pearson Grippe, with pt's permission and choice of provider.   Action/Plan: Will cont to follow, anticipate DC in next few days depending on progress of diuresis.   Expected Discharge Date:    10/15/2015               Expected Discharge Plan:  Home w Home Health Services  In-House Referral:  NA  Discharge planning Services  CM Consult  Post Acute Care Choice:  Home Health Choice offered to:  Patient  DME Arranged:    DME Agency:     HH Arranged:  RN HH Agency:  [facility]  Status of Service:  In process, will continue to follow  Medicare Important Message Given:    Date Medicare IM Given:    Medicare IM give by:    Date Additional Medicare IM Given:    Additional Medicare Important Message give by:     If discussed at Long Length of Stay Meetings, dates discussed:    Additional Comments:  [name], RN 10/09/2015, 2:49 PM

## 2015-10-10 LAB — BASIC METABOLIC PANEL
Anion gap: 9 (ref 5–15)
BUN: 27 mg/dL — AB (ref 6–20)
CO2: 28 mmol/L (ref 22–32)
CREATININE: 1.4 mg/dL — AB (ref 0.61–1.24)
Calcium: 8.9 mg/dL (ref 8.9–10.3)
Chloride: 108 mmol/L (ref 101–111)
GFR calc Af Amer: >60 mL/min (ref >60)
GFR, EST NON AFRICAN AMERICAN: 58 mL/min — AB (ref >60)
Glucose, Bld: 79 mg/dL (ref 65–99)
POTASSIUM: 4.1 mmol/L (ref 3.5–5.1)
SODIUM: 145 mmol/L (ref 135–145)

## 2015-10-10 MED ORDER — ISOSORBIDE MONONITRATE ER 60 MG PO TB24: 30.0000 mg | ORAL_TABLET | Freq: Every day | ORAL | Status: DC

## 2015-10-10 NOTE — Progress Notes (Signed)
[facility] PROGRESS NOTE  [name] RUE:[id number] DOB: [birth year] DOA: 10/07/2015 PCP: [name]  Assessment/Plan: Acute on chronic systolic CHF -Echo this admission shows an ejection fraction of 20-25% which is substantially decreased from 55% just 4 months ago in December 2016. Study was not sufficient to determine diastolic function. -Is currently being seen by cardiology. We suspect that his decreased ejection fraction is due to his severe noncompliance with diet and medications, which he admits to. -He has diuresed 5.5 L since admission and his volume status is improved. -Have counseled him on diet, he does not seem to understand. Will request a nutrition consultation. -Continue Lasix 40 mg IV twice a day and strive for negative fluid balance.  Atrial fibrillation -Continue metoprolol 75 mg twice a day, is on Xarelto for anticoagulation.  Acute renal failure -Likely secondary to diuresis, monitor.  Hypertension -Remains uncontrolled. -Hopefully will continue to improve diuresis. Next an-continue Toprol, losartan, Aldactone.  Hyperlipidemia -Continue statin  Code Status: Full code  Family Communication: Patient only  Disposition Plan: To be determined  Consultants:  Cardiology  Antibiotics:  None   Subjective: States he feels well, freely admits to not taking medications as prescribed because they "increase my fluid"   Objective: Filed Vitals:   10/10/15 0533 10/10/15 0535 10/10/15 0745 10/10/15 1050  BP: 144/113 152/110  130/101  Pulse: 88   62  Temp: 97.7 F (36.5 C)     TempSrc: Oral     Resp: 20     Height:      Weight: 135.263 kg (298 lb 3.2 oz)     SpO2: 95%  97%     Intake/Output Summary (Last 24 hours) at 10/10/15 1601 Last data filed at 10/10/15 1428  Gross per 24 hour  Intake    483 ml  Output   1000 ml  Net   -517 ml   Filed Weights   10/08/15 0640 10/09/15 0415 10/10/15 0533  Weight: 138.71 kg (305 lb  12.8 oz) 135.308 kg (298 lb 4.8 oz) 135.263 kg (298 lb 3.2 oz)    Exam:   General:  Alert, awake, oriented 3, wears dark glasses even in the room   Cardiovascular: Regular rate and rhythm   Respiratory: Clear to auscultation bilaterally   Abdomen: Obese, soft, nontender, nondistended, positive bowel sounds   Extremities: 3+ pitting edema bilaterally  Neurologic:  Grossly intact and nonfocal  Data Reviewed: Basic Metabolic Panel:  Recent Labs Lab 10/07/15 1741 10/08/15 0615 10/09/15 0535 10/10/15 0554  NA 143 145 141 145  K 3.7 3.4* 3.6 4.1  CL 110 108 109 108  CO2 GLUCOSE 107* 94 91 79  BUN 23* 21* 22* 27*  CREATININE 1.25* 1.17 1.10 1.40*  CALCIUM 8.6* 8.8* 8.6* 8.9   Liver Function Tests:  Recent Labs Lab 10/08/15 0615  AST 28  ALT 27  ALKPHOS 66  BILITOT 1.6*  PROT 6.8  ALBUMIN 3.7   No results for input(s): LIPASE, AMYLASE in the last 168 hours. No results for input(s): AMMONIA in the last 168 hours. CBC:  Recent Labs Lab 10/07/15 1741 10/08/15 0615  WBC 3.3* 4.0  HGB 11.5* 12.7*  HCT 35.7* 38.9*  MCV 86.9 88.0  PLT 195 226   Cardiac Enzymes:  Recent Labs Lab 10/07/15 1741  TROPONINI 0.03   BNP (last 3 results)  Recent Labs  09/26/15 0611 09/28/15 1119 10/07/15 1741  BNP 620.0* 845.7* 616.0*  ProBNP (last 3 results) No results for input(s): PROBNP in the last 8760 hours.  CBG: No results for input(s): GLUCAP in the last 168 hours.  No results found for this or any previous visit (from the past 240 hour(s)).   Studies: No results found.  Scheduled Meds: . atorvastatin  80 mg Oral q1800  . budesonide (PULMICORT) nebulizer solution  0.25 mg Nebulization BID  . furosemide  40 mg Intravenous Q12H  . losartan  100 mg Oral Daily  . metoprolol succinate  75 mg Oral BID  . potassium chloride SA  20 mEq Oral BID  . rivaroxaban  20 mg Oral Q supper  . sodium chloride flush  3 mL Intravenous Q12H  . sodium  chloride flush  3 mL Intravenous Q12H  . spironolactone  12.5 mg Oral Daily   Continuous Infusions:   Principal Problem:   Acute on chronic combined systolic and diastolic heart failure (HCC) Active Problems:   OBESITY, MORBID   Essential hypertension   Sleep apnea   Noncompliance with medication regimen   Acute respiratory failure with hypoxia (HCC)   Chronic atrial fibrillation (HCC)   Pulmonary hypertension (HCC)   Acute exacerbation of CHF (congestive heart failure) (HCC)    Time spent: 25 minutes.  Greater than 50% of this time was spent in direct contact with the patient coordinating care.    Chaya Jan  [facility] Pager [phone]  If 7PM-7AM, please contact night-coverage at [email or web address], password [facility] 10/10/2015, 4:01 PM  LOS: 1 day

## 2015-10-10 NOTE — Progress Notes (Signed)
Primary Cardiologist: Ermalene Searing MD  Cardiology Specific Problem List: 1. Acute on Chronic Combined CHF 2. Chronic Atrial fibrillation 3. Hypertension 4. NICM EF of 20-25%  Subjective:    Still with some SOB this AM  Objective:   Temp:  [97.7 F (36.5 C)-98 F (36.7 C)] 97.7 F (36.5 C) (04/20 0533) Pulse Rate:  [79-108] 88 (04/20 0533) Resp:  [20] 20 (04/20 0533) BP: (135-152)/(85-113) 152/110 mmHg (04/20 0535) SpO2:  [93 %-100 %] 95 % (04/20 0533) Weight:  [298 lb 3.2 oz (135.263 kg)] 298 lb 3.2 oz (135.263 kg) (04/20 0533)    Filed Weights   10/08/15 0640 10/09/15 0415 10/10/15 0533  Weight: 305 lb 12.8 oz (138.71 kg) 298 lb 4.8 oz (135.308 kg) 298 lb 3.2 oz (135.263 kg)    Intake/Output Summary (Last 24 hours) at 10/10/15 0743 Last data filed at 10/10/15 0200  Gross per 24 hour  Intake    680 ml  Output   1800 ml  Net  -1120 ml    Telemetry:Atrial fib with on 2.12 second pause.   Exam:  General: No acute distress.  HEENT: Conjunctiva and lids normal, oropharynx clear.  Lungs: Bibasilar crackles are noted, with mild inspiratory wheezes  Cardiac: No elevated JVP or bruits. IRRR, no gallop or rub.   Abdomen: Normoactive bowel sounds, nontender, distended.  Extremities: 2+ pre tiibial pitting edema, 3+ in ankles.   Neuropsychiatric: Alert and oriented x3, affect appropriate.   Lab Results:  Basic Metabolic Panel:  Recent Labs Lab 10/07/15 1741 10/08/15 0615 10/09/15 0535  NA 143 145 141  K 3.7 3.4* 3.6  CL 110 108 109  CO2 GLUCOSE 107* 94 91  BUN 23* 21* 22*  CREATININE 1.25* 1.17 1.10  CALCIUM 8.6* 8.8* 8.6*    Liver Function Tests:  Recent Labs Lab 10/08/15 0615  AST 28  ALT 27  ALKPHOS 66  BILITOT 1.6*  PROT 6.8  ALBUMIN 3.7    CBC:  Recent Labs Lab 10/07/15 1741 10/08/15 0615  WBC 3.3* 4.0  HGB 11.5* 12.7*  HCT 35.7* 38.9*  MCV 86.9 88.0  PLT 195 226    Cardiac Enzymes:  Recent  Labs Lab 10/07/15 1741  TROPONINI 0.03    Coagulation:  Recent Labs Lab 10/07/15 1741  INR 1.25   Echocardiogram 09/30/2015 Left ventricle: The cavity size was normal. There was severe  concentric hypertrophy. Systolic function was severely reduced.  The estimated ejection fraction was in the range of 20% to 25%.  Diffuse hypokinesis. The study is not technically sufficient to  allow evaluation of LV diastolic function. - Mitral valve: Mildly thickened leaflets . There was trivial  regurgitation. - Left atrium: Moderately dilated at 47 ml/m2. - Right ventricle: The cavity size was mildly dilated. Systolic  function was normal. - Right atrium: Severely dilated at 29 cm2. - Tricuspid valve: There was mild regurgitation. - Pulmonary arteries: PA peak pressure: 49 mm Hg (S). - Inferior vena cava: The vessel was dilated. The respirophasic  diameter changes were blunted (< 50%), consistent with elevated  central venous pressure.  Medications:   Scheduled Medications: . atorvastatin  80 mg Oral q1800  . budesonide (PULMICORT) nebulizer solution  0.25 mg Nebulization BID  . furosemide  40 mg Intravenous Q12H  . losartan  100 mg Oral Daily  . metoprolol succinate  75 mg Oral BID  . potassium chloride SA  20 mEq Oral BID  . rivaroxaban  20 mg  Oral Q supper  . sodium chloride flush  3 mL Intravenous Q12H  . sodium chloride flush  3 mL Intravenous Q12H  . spironolactone  12.5 mg Oral Daily    Infusions:    PRN Medications: sodium chloride, ondansetron **OR** ondansetron (ZOFRAN) IV, sodium chloride flush   Assessment and Plan:   1. Acute on Chronic Mixed CHF: Has diuresed 5.5 L since admission, weight is down 6 pounds. Dry weight is normally around 279-281 pounds. He is currently at 298 pounds. We'll have nurses weigh him again on standup scale without his shoes on. We'll also put him on a 1500 cc fluid restriction. The patient is drinking a lot of fluid throughout  the day. Continue IV diuresis. Consider changing to [facility] with significant systolic dysfunction. When transitioning to by mouth medication, would use torsemide as set of Lasix.  2. Atrial fib: Heart rate is labile with some rates in the 90s to 100s. He had one pause  at 2.12 overnight. He is currently on metoprolol 75 mg twice a day. He continues on Xarelto 20 mg daily.   3. NICM: Patient is on beta blocker, losartan, spironolactone. Consider trans-positioning to Entresto from losartan in the setting of significant systolic dysfunction EF of 20-25%. He is currently not a candidate for AICD as he has a history of chronic non-adherence to medication and dietary regimen.  4. Hypertension: Blood pressures not well controlled in the setting of systolic dysfunction. Follow with diuresis.       Bettey Mare. Lawrence NP [facility]  10/10/2015, 7:43 AM   Patient seen and discussed with NP Lyman Bishop, I agree with her documentation above. History of chronic diastolic HF and medication noncompliance admitted with CHF. Echo 09/2015 LVEF 20-25%, cannot eval diastolic dysfunction. Significant drop in LVEF compared to 05/2015 echo where LVEF was 55-60%. Normal coronaries by cath 05/2015, doubt obstructive disease would have developed in 4 months as the cause of his new systolic dysfunction. Most likely related to medication noncompliance, perhaps tachycardia mediated. Recent normal TSH. He is negative 1.1 liters yesterday, negative 5.5 liters since admission. He is on lasix 40mg  IV bid, renal function had been trending down with diuresis however now trending up. Weight 298 lbs, reported dry weaight around 280 lbs. Continue IV diuretics today. He is on Toprol, losarant, aldactone for his systolic dysfunction. Afib rate controlled with Toprol and xarelto for stroke prevention.   Dominga Ferry MD

## 2015-10-11 LAB — BASIC METABOLIC PANEL
ANION GAP: 10 (ref 5–15)
BUN: 28 mg/dL — ABNORMAL HIGH (ref 6–20)
CALCIUM: 8.8 mg/dL — AB (ref 8.9–10.3)
CO2: 26 mmol/L (ref 22–32)
Chloride: 108 mmol/L (ref 101–111)
Creatinine, Ser: 1.36 mg/dL — ABNORMAL HIGH (ref 0.61–1.24)
GFR calc Af Amer: >60 mL/min (ref >60)
GFR calc non Af Amer: >60 mL/min (ref >60)
GLUCOSE: 90 mg/dL (ref 65–99)
Potassium: 3.6 mmol/L (ref 3.5–5.1)
Sodium: 144 mmol/L (ref 135–145)

## 2015-10-11 MED ORDER — FUROSEMIDE 10 MG/ML IJ SOLN
20.0000 mg | Freq: Once | INTRAMUSCULAR | Status: AC
  Administered 2015-10-11: 20 mg via INTRAVENOUS
  Filled 2015-10-11: qty 2

## 2015-10-11 MED ORDER — FUROSEMIDE 10 MG/ML IJ SOLN
60.0000 mg | Freq: Two times a day (BID) | INTRAMUSCULAR | Status: DC
  Administered 2015-10-11 – 2015-10-15 (×8): 60 mg via INTRAVENOUS
  Filled 2015-10-11 (×8): qty 6

## 2015-10-11 NOTE — Care Management Note (Signed)
Case Management Note  Patient Details  Name: ELHADJ BAZAN MRN: [id number] Date of Birth: 1965/07/02  Expected Discharge Date:      10/15/2015            Expected Discharge Plan:  Home w Home Health Services  In-House Referral:  NA  Discharge planning Services  CM Consult  Post Acute Care Choice:  Home Health Choice offered to:  Patient  DME Arranged:    DME Agency:     HH Arranged:  RN HH Agency:  [facility]  Status of Service:  Completed, signed off  Medicare Important Message Given:  Yes Date Medicare IM Given:    Medicare IM give by:    Date Additional Medicare IM Given:    Additional Medicare Important Message give by:     If discussed at Long Length of Stay Meetings, dates discussed:    Additional Comments: If pt discharges home over weekend, RN needs to notify [facility] of pt's DC. Pt is aware HH has 48 hours to initiate services at DC.  [name], RN 10/11/2015, 3:09 PM

## 2015-10-11 NOTE — Care Management Important Message (Signed)
Important Message  Patient Details  Name: [name] MRN: [id number] Date of Birth: 04-Oct-1965   Medicare Important Message Given:  Yes    [name], RN 10/11/2015, 3:08 PM

## 2015-10-11 NOTE — Progress Notes (Signed)
Notified Dr. Wyline Mood that the patient has refused his spironolactone today and yesterday according to [facility] the night nurse.  He verbalized understanding.

## 2015-10-11 NOTE — Progress Notes (Signed)
[facility] PROGRESS NOTE  [name] BTC:[id number] DOB: 1965/12/04 DOA: 10/07/2015 [facility]: [name]  Assessment/Plan: Acute on chronic systolic CHF -Echo this admission shows an ejection fraction of 20-25% which is substantially decreased from 55% just 4 months ago in December 2016. Study was not sufficient to determine diastolic function. -Is currently being seen by cardiology. We suspect that his decreased ejection fraction is due to his severe noncompliance with diet and medications, which he admits to. He has been refusing his spironolactone today. -He has diuresed 6.7 L since admission and his volume status is improved. -Have counseled him on diet, he does not seem to understand. Will request a nutrition consultation. -Agree with increasing Lasix to 60 mg twice a day.  Atrial fibrillation -Continue metoprolol 75 mg twice a day, is on Xarelto for anticoagulation.  Acute renal failure -Likely secondary to diuresis, monitor.  Hypertension -Remains uncontrolled. -Hopefully will continue to improve diuresis.  -continue Toprol, losartan, Aldactone.  Hyperlipidemia -Continue statin  Code Status: Full code  Family Communication: Patient only  Disposition Plan: To be determined  Consultants:  Cardiology  Antibiotics:  None   Subjective: States he feels well, freely admits to not taking medications as prescribed because they "increase my fluid"   Objective: Filed Vitals:   10/11/15 0300 10/11/15 0443 10/11/15 0750 10/11/15 1008  BP:  140/101 135/93   Pulse:      Temp:  98.8 F (37.1 C)    TempSrc:  Oral    Resp:  18    Height:      Weight: 135.127 kg (297 lb 14.4 oz)     SpO2:  96%  94%    Intake/Output Summary (Last 24 hours) at 10/11/15 1319 Last data filed at 10/11/15 0800  Gross per 24 hour  Intake    720 ml  Output   2000 ml  Net  -1280 ml   Filed Weights   10/09/15 0415 10/10/15 0533 10/11/15 0300  Weight: 135.308 kg  (298 lb 4.8 oz) 135.263 kg (298 lb 3.2 oz) 135.127 kg (297 lb 14.4 oz)    Exam:   General:  Alert, awake, oriented 3, wears dark glasses even in the room   Cardiovascular: Regular rate and rhythm   Respiratory: Clear to auscultation bilaterally   Abdomen: Obese, soft, nontender, nondistended, positive bowel sounds   Extremities: 3+ pitting edema bilaterally  Neurologic:  Grossly intact and nonfocal  Data Reviewed: Basic Metabolic Panel:  Recent Labs Lab 10/07/15 1741 10/08/15 0615 10/09/15 0535 10/10/15 0554 10/11/15 0609  NA 143 145 141 145 144  K 3.7 3.4* 3.6 4.1 3.6  CL 110 108 109 108 108  CO2 25 27 27 28 26   GLUCOSE 107* 94 91 79 90  BUN 23* 21* 22* 27* 28*  CREATININE 1.25* 1.17 1.10 1.40* 1.36*  CALCIUM 8.6* 8.8* 8.6* 8.9 8.8*   Liver Function Tests:  Recent Labs Lab 10/08/15 0615  AST 28  ALT 27  ALKPHOS 66  BILITOT 1.6*  PROT 6.8  ALBUMIN 3.7   No results for input(s): LIPASE, AMYLASE in the last 168 hours. No results for input(s): AMMONIA in the last 168 hours. CBC:  Recent Labs Lab 10/07/15 1741 10/08/15 0615  WBC 3.3* 4.0  HGB 11.5* 12.7*  HCT 35.7* 38.9*  MCV 86.9 88.0  PLT 195 226   Cardiac Enzymes:  Recent Labs Lab 10/07/15 1741  TROPONINI 0.03   BNP (last 3 results)  Recent Labs  09/26/15  1610 09/28/15 1119 10/07/15 1741  BNP 620.0* 845.7* 616.0*    ProBNP (last 3 results) No results for input(s): PROBNP in the last 8760 hours.  CBG: No results for input(s): GLUCAP in the last 168 hours.  No results found for this or any previous visit (from the past 240 hour(s)).   Studies: No results found.  Scheduled Meds: . atorvastatin  80 mg Oral q1800  . budesonide (PULMICORT) nebulizer solution  0.25 mg Nebulization BID  . furosemide  60 mg Intravenous Q12H  . losartan  100 mg Oral Daily  . metoprolol succinate  75 mg Oral BID  . potassium chloride SA  20 mEq Oral BID  . rivaroxaban  20 mg Oral Q supper  . sodium  chloride flush  3 mL Intravenous Q12H  . sodium chloride flush  3 mL Intravenous Q12H  . spironolactone  12.5 mg Oral Daily   Continuous Infusions:   Principal Problem:   Acute on chronic combined systolic and diastolic heart failure (HCC) Active Problems:   OBESITY, MORBID   Essential hypertension   Sleep apnea   Noncompliance with medication regimen   Acute respiratory failure with hypoxia (HCC)   Chronic atrial fibrillation (HCC)   Pulmonary hypertension (HCC)   Acute exacerbation of CHF (congestive heart failure) (HCC)    Time spent: 25 minutes.  Greater than 50% of this time was spent in direct contact with the patient coordinating care.    Chaya Jan  [facility] Pager [phone]  If 7PM-7AM, please contact night-coverage at [email or web address], password [facility] 10/11/2015, 1:19 PM  LOS: 2 days

## 2015-10-11 NOTE — Progress Notes (Signed)
Patient ID: MOURAD CWIKLA, male   DOB: Nov 30, 1965, 50 y.o.   MRN: [id number]     Subjective:    SOB is improving  Objective:   Temp:  [98.8 F (37.1 C)-99.1 F (37.3 C)] 98.8 F (37.1 C) (04/21 0443) Pulse Rate:  [62] 62 (04/20 1050) Resp:  [18-20] 18 (04/21 0443) BP: (130-140)/(93-101) 135/93 mmHg (04/21 0750) SpO2:  [95 %-100 %] 96 % (04/21 0443) Weight:  [297 lb 14.4 oz (135.127 kg)] 297 lb 14.4 oz (135.127 kg) (04/21 0300) Last BM Date: 10/10/15  Filed Weights   10/09/15 0415 10/10/15 0533 10/11/15 0300  Weight: 298 lb 4.8 oz (135.308 kg) 298 lb 3.2 oz (135.263 kg) 297 lb 14.4 oz (135.127 kg)    Intake/Output Summary (Last 24 hours) at 10/11/15 0936 Last data filed at 10/11/15 0800  Gross per 24 hour  Intake    723 ml  Output   2000 ml  Net  -1277 ml    Telemetry: Rate controlled afib  Exam:  General: NAD  HEENT: sclera clear, throat clear  Resp: CTAB  Cardiac: irreg, no m/r/g, no jvd  GI: abdomen soft, NT, ND  MSK: 2+ bilateral LE edema  Neuro: no focal deficits  Psych: appropriate affect  Lab Results:  Basic Metabolic Panel:  Recent Labs Lab 10/09/15 0535 10/10/15 0554 10/11/15 0609  NA 141 145 144  K 3.6 4.1 3.6  CL 109 108 108  CO2 GLUCOSE 91 79 90  BUN 22* 27* 28*  CREATININE 1.10 1.40* 1.36*  CALCIUM 8.6* 8.9 8.8*    Liver Function Tests:  Recent Labs Lab 10/08/15 0615  AST 28  ALT 27  ALKPHOS 66  BILITOT 1.6*  PROT 6.8  ALBUMIN 3.7    CBC:  Recent Labs Lab 10/07/15 1741 10/08/15 0615  WBC 3.3* 4.0  HGB 11.5* 12.7*  HCT 35.7* 38.9*  MCV 86.9 88.0  PLT 195 226    Cardiac Enzymes:  Recent Labs Lab 10/07/15 1741  TROPONINI 0.03    BNP: No results for input(s): PROBNP in the last 8760 hours.  Coagulation:  Recent Labs Lab 10/07/15 1741  INR 1.25    ECG:   Medications:   Scheduled Medications: . atorvastatin  80 mg Oral q1800  . budesonide (PULMICORT) nebulizer solution  0.25 mg  Nebulization BID  . furosemide  40 mg Intravenous Q12H  . losartan  100 mg Oral Daily  . metoprolol succinate  75 mg Oral BID  . potassium chloride SA  20 mEq Oral BID  . rivaroxaban  20 mg Oral Q supper  . sodium chloride flush  3 mL Intravenous Q12H  . sodium chloride flush  3 mL Intravenous Q12H  . spironolactone  12.5 mg Oral Daily     Infusions:     PRN Medications:  sodium chloride, ondansetron **OR** ondansetron (ZOFRAN) IV, sodium chloride flush     Assessment/Plan    1. Acute on chronic combined systolic/diastolic HF - Echo 09/2015 LVEF 20-25%, cannot eval diastolic dysfunction. Significant drop in LVEF compared to 05/2015 echo where LVEF was 55-60%. Normal coronaries by cath 05/2015, doubt obstructive disease would have developed in 4 months as the cause of his new systolic dysfunction. Most likely related to medication noncompliance, perhaps tachycardia mediated.Recent normal TSH - he is negative 1.5 liters yesterday, negative 6.8 liters since admission. He is on lasix  IV bid, overalls table renal function. We will increase lasix to  IV bid, goal net negative 2  to 2.5 liters per day - Weight 297 lbs, reported dry weaight around 280 lbs - He is on Toprol, losartan and aldactone for his systolic dysfunction  2.Afib rate controlled with Toprol and xarelto for stroke prevention.   Dina Rich, M.D.

## 2015-10-12 LAB — BASIC METABOLIC PANEL
Anion gap: 10 (ref 5–15)
BUN: 27 mg/dL — AB (ref 6–20)
CHLORIDE: 108 mmol/L (ref 101–111)
CO2: 27 mmol/L (ref 22–32)
CREATININE: 1.4 mg/dL — AB (ref 0.61–1.24)
Calcium: 9 mg/dL (ref 8.9–10.3)
GFR calc Af Amer: >60 mL/min (ref >60)
GFR calc non Af Amer: 58 mL/min — ABNORMAL LOW (ref >60)
Glucose, Bld: 87 mg/dL (ref 65–99)
Potassium: 3.7 mmol/L (ref 3.5–5.1)
SODIUM: 145 mmol/L (ref 135–145)

## 2015-10-12 MED ORDER — ALBUTEROL SULFATE (2.5 MG/3ML) 0.083% IN NEBU: 2.5000 mg | INHALATION_SOLUTION | RESPIRATORY_TRACT | Status: DC | PRN

## 2015-10-12 NOTE — Progress Notes (Signed)
[facility] PROGRESS NOTE  [name] ZOX:[id number] DOB: 04-Oct-1965 DOA: 10/07/2015 PCP: [name]  Assessment/Plan: Acute on chronic systolic CHF -Echo this admission shows an ejection fraction of 20-25% which is substantially decreased from 55% just 4 months ago in December 2016. Study was not sufficient to determine diastolic function. -Is currently being seen by cardiology. We suspect that his decreased ejection fraction is due to his severe noncompliance with diet and medications, which he admits to. He has been refusing his spironolactone today. -He has diuresed 8.2 L since admission and about 2 L negative in the past 24 hours and his volume status is improved. -Have counseled him on diet, he does not seem to understand. Will request a nutrition consultation. -Continue Lasix 60 mg twice a day.  Atrial fibrillation -Continue metoprolol 75 mg twice a day, is on Xarelto for anticoagulation.  Acute renal failure -Likely secondary to diuresis, monitor.  Hypertension -Remains uncontrolled. -Hopefully will continue to improve diuresis.  -continue Toprol, losartan, Aldactone.  Hyperlipidemia -Continue statin  Code Status: Full code  Family Communication: Patient only  Disposition Plan: To be determined  Consultants:  Cardiology  Antibiotics:  None   Subjective: States he feels well, freely admits to not taking medications as prescribed because they "increase my fluid"   Objective: Filed Vitals:   10/11/15 2100 10/12/15 0547 10/12/15 0819 10/12/15 1423  BP: 135/109 144/107  105/86  Pulse: 96 81  71  Temp: 98.5 F (36.9 C) 98.5 F (36.9 C)  98.2 F (36.8 C)  TempSrc: Oral   Oral  Resp: Height:      Weight:      SpO2: 99% 94% 97% 98%    Intake/Output Summary (Last 24 hours) at 10/12/15 1520 Last data filed at 10/12/15 1314  Gross per 24 hour  Intake    480 ml  Output   2000 ml  Net  -1520 ml   Filed Weights   10/09/15 0415 10/10/15 0533 10/11/15 0300  Weight: 135.308 kg (298 lb 4.8 oz) 135.263 kg (298 lb 3.2 oz) 135.127 kg (297 lb 14.4 oz)    Exam:   General:  Alert, awake, oriented 3, wears dark glasses even in the room   Cardiovascular: Regular rate and rhythm   Respiratory: Clear to auscultation bilaterally   Abdomen: Obese, soft, nontender, nondistended, positive bowel sounds   Extremities: 3+ pitting edema bilaterally  Neurologic:  Grossly intact and nonfocal  Data Reviewed: Basic Metabolic Panel:  Recent Labs Lab 10/08/15 0615 10/09/15 0535 10/10/15 0554 10/11/15 0609 10/12/15 0623  NA 145 141 145 144 145  K 3.4* 3.6 4.1 3.6 3.7  CL 108 109 108 108 108  CO2 GLUCOSE 94 91 79 90 87  BUN 21* 22* 27* 28* 27*  CREATININE 1.17 1.10 1.40* 1.36* 1.40*  CALCIUM 8.8* 8.6* 8.9 8.8* 9.0   Liver Function Tests:  Recent Labs Lab 10/08/15 0615  AST 28  ALT 27  ALKPHOS 66  BILITOT 1.6*  PROT 6.8  ALBUMIN 3.7   No results for input(s): LIPASE, AMYLASE in the last 168 hours. No results for input(s): AMMONIA in the last 168 hours. CBC:  Recent Labs Lab 10/07/15 1741 10/08/15 0615  WBC 3.3* 4.0  HGB 11.5* 12.7*  HCT 35.7* 38.9*  MCV 86.9 88.0  PLT 195 226   Cardiac Enzymes:  Recent Labs Lab 10/07/15 1741  TROPONINI 0.03   BNP (last  3 results)  Recent Labs  09/26/15 0611 09/28/15 1119 10/07/15 1741  BNP 620.0* 845.7* 616.0*    ProBNP (last 3 results) No results for input(s): PROBNP in the last 8760 hours.  CBG: No results for input(s): GLUCAP in the last 168 hours.  No results found for this or any previous visit (from the past 240 hour(s)).   Studies: No results found.  Scheduled Meds: . atorvastatin  80 mg Oral q1800  . budesonide (PULMICORT) nebulizer solution  0.25 mg Nebulization BID  . furosemide  60 mg Intravenous Q12H  . losartan  100 mg Oral Daily  . metoprolol succinate  75 mg Oral BID  . potassium chloride SA  20  mEq Oral BID  . rivaroxaban  20 mg Oral Q supper  . sodium chloride flush  3 mL Intravenous Q12H  . sodium chloride flush  3 mL Intravenous Q12H  . spironolactone  12.5 mg Oral Daily   Continuous Infusions:   Principal Problem:   Acute on chronic combined systolic and diastolic heart failure (HCC) Active Problems:   OBESITY, MORBID   Essential hypertension   Sleep apnea   Noncompliance with medication regimen   Acute respiratory failure with hypoxia (HCC)   Chronic atrial fibrillation (HCC)   Pulmonary hypertension (HCC)   Acute exacerbation of CHF (congestive heart failure) (HCC)    Time spent: 25 minutes.  Greater than 50% of this time was spent in direct contact with the patient coordinating care.    Chaya Jan  [facility] Pager [phone]  If 7PM-7AM, please contact night-coverage at [email or web address], password [facility] 10/12/2015, 3:20 PM  LOS: 3 days

## 2015-10-13 LAB — BASIC METABOLIC PANEL
ANION GAP: 9 (ref 5–15)
BUN: 27 mg/dL — AB (ref 6–20)
CO2: 27 mmol/L (ref 22–32)
Calcium: 8.8 mg/dL — ABNORMAL LOW (ref 8.9–10.3)
Chloride: 109 mmol/L (ref 101–111)
Creatinine, Ser: 1.14 mg/dL (ref 0.61–1.24)
GFR calc Af Amer: >60 mL/min (ref >60)
GLUCOSE: 80 mg/dL (ref 65–99)
POTASSIUM: 3.6 mmol/L (ref 3.5–5.1)
Sodium: 145 mmol/L (ref 135–145)

## 2015-10-13 NOTE — Progress Notes (Signed)
[facility] PROGRESS NOTE  MARKHAM STRAHL RCB:[id number] DOB: 01-Dec-1965 DOA: 10/07/2015 PCP: [name]  Assessment/Plan: Acute on chronic systolic CHF -Echo this admission shows an ejection fraction of 20-25% which is substantially decreased from 55% just 4 months ago in December 2016. Study was not sufficient to determine diastolic function. -Is currently being seen by cardiology. We suspect that his decreased ejection fraction is due to his severe noncompliance with diet and medications, which he admits to. He has been refusing his spironolactone today. -He has diuresed 10.9 L since admission and about 2 L negative in the past 24 hours and his volume status is improved. -Have counseled him on diet, he does not seem to understand. Will request a nutrition consultation. -Continue Lasix 60 mg twice a day. -, Likely transition to by mouth Lasix in a.m.  Atrial fibrillation -Continue metoprolol 75 mg twice a day, is on Xarelto for anticoagulation.  Acute renal failure -Likely secondary to diuresis, monitor.  Hypertension -Remains uncontrolled. -Hopefully will continue to improve diuresis.  -continue Toprol, losartan, Aldactone.  Hyperlipidemia -Continue statin  Code Status: Full code  Family Communication: Patient only  Disposition Plan: To be determined  Consultants:  Cardiology  Antibiotics:  None   Subjective: States he feels well, freely admits to not taking medications as prescribed because they "increase my fluid"   Objective: Filed Vitals:   10/12/15 2253 10/13/15 0713 10/13/15 0948 10/13/15 1300  BP: 135/116 143/116  154/99  Pulse: 87 88  107  Temp: 98 F (36.7 C) 98.2 F (36.8 C)  97.9 F (36.6 C)  TempSrc: Oral Oral  Oral  Resp: 18 18  18   Height:      Weight:      SpO2: 100% 100% 93% 98%    Intake/Output Summary (Last 24 hours) at 10/13/15 1711 Last data filed at 10/13/15 1200  Gross per 24 hour  Intake    480 ml    Output   3450 ml  Net  -2970 ml   Filed Weights   10/09/15 0415 10/10/15 0533 10/11/15 0300  Weight: 135.308 kg (298 lb 4.8 oz) 135.263 kg (298 lb 3.2 oz) 135.127 kg (297 lb 14.4 oz)    Exam:   General:  Alert, awake, oriented 3, wears dark glasses even in the room   Cardiovascular: Regular rate and rhythm   Respiratory: Clear to auscultation bilaterally   Abdomen: Obese, soft, nontender, nondistended, positive bowel sounds   Extremities: 3+ pitting edema bilaterally  Neurologic:  Grossly intact and nonfocal  Data Reviewed: Basic Metabolic Panel:  Recent Labs Lab 10/09/15 0535 10/10/15 0554 10/11/15 0609 10/12/15 0623 10/13/15 0551  NA 141 145 144 145 145  K 3.6 4.1 3.6 3.7 3.6  CL 109 108 108 108 109  CO2 27 28 26 27 27   GLUCOSE 91 79 90 87 80  BUN 22* 27* 28* 27* 27*  CREATININE 1.10 1.40* 1.36* 1.40* 1.14  CALCIUM 8.6* 8.9 8.8* 9.0 8.8*   Liver Function Tests:  Recent Labs Lab 10/08/15 0615  AST 28  ALT 27  ALKPHOS 66  BILITOT 1.6*  PROT 6.8  ALBUMIN 3.7   No results for input(s): LIPASE, AMYLASE in the last 168 hours. No results for input(s): AMMONIA in the last 168 hours. CBC:  Recent Labs Lab 10/07/15 1741 10/08/15 0615  WBC 3.3* 4.0  HGB 11.5* 12.7*  HCT 35.7* 38.9*  MCV 86.9 88.0  PLT 195 226   Cardiac Enzymes:  Recent  Labs Lab 10/07/15 1741  TROPONINI 0.03   BNP (last 3 results)  Recent Labs  09/26/15 0611 09/28/15 1119 10/07/15 1741  BNP 620.0* 845.7* 616.0*    ProBNP (last 3 results) No results for input(s): PROBNP in the last 8760 hours.  CBG: No results for input(s): GLUCAP in the last 168 hours.  No results found for this or any previous visit (from the past 240 hour(s)).   Studies: No results found.  Scheduled Meds: . atorvastatin  80 mg Oral q1800  . budesonide (PULMICORT) nebulizer solution  0.25 mg Nebulization BID  . furosemide  60 mg Intravenous Q12H  . losartan  100 mg Oral Daily  . metoprolol  succinate  75 mg Oral BID  . potassium chloride SA  20 mEq Oral BID  . rivaroxaban  20 mg Oral Q supper  . sodium chloride flush  3 mL Intravenous Q12H  . sodium chloride flush  3 mL Intravenous Q12H  . spironolactone  12.5 mg Oral Daily   Continuous Infusions:   Principal Problem:   Acute on chronic combined systolic and diastolic heart failure (HCC) Active Problems:   OBESITY, MORBID   Essential hypertension   Sleep apnea   Noncompliance with medication regimen   Acute respiratory failure with hypoxia (HCC)   Chronic atrial fibrillation (HCC)   Pulmonary hypertension (HCC)   Acute exacerbation of CHF (congestive heart failure) (HCC)    Time spent: 25 minutes.  Greater than 50% of this time was spent in direct contact with the patient coordinating care.    Chaya Jan  [facility] Pager [phone]  If 7PM-7AM, please contact night-coverage at [email or web address], password [facility] 10/13/2015, 5:11 PM  LOS: 4 days

## 2015-10-14 DIAGNOSIS — I10 Essential (primary) hypertension: Secondary | ICD-10-CM

## 2015-10-14 LAB — BASIC METABOLIC PANEL
ANION GAP: 9 (ref 5–15)
BUN: 25 mg/dL — ABNORMAL HIGH (ref 6–20)
CALCIUM: 8.9 mg/dL (ref 8.9–10.3)
CO2: 28 mmol/L (ref 22–32)
CREATININE: 1.2 mg/dL (ref 0.61–1.24)
Chloride: 108 mmol/L (ref 101–111)
GLUCOSE: 82 mg/dL (ref 65–99)
Potassium: 3.7 mmol/L (ref 3.5–5.1)
Sodium: 145 mmol/L (ref 135–145)

## 2015-10-14 NOTE — Progress Notes (Signed)
[facility] PROGRESS NOTE  [name] ZOX:[id number] DOB: 1965/08/27 DOA: 10/07/2015 PCP: [name]  Assessment/Plan: Acute on chronic systolic CHF -Echo this admission shows an ejection fraction of 20-25% which is substantially decreased from 55% just 4 months ago in December 2016. Study was not sufficient to determine diastolic function. -Is currently being seen by cardiology. We suspect that his decreased ejection fraction is due to his severe noncompliance with diet and medications, which he admits to.  -He has diuresed 12.9 L since admission and about 2 L negative in the past 24 hours and his volume status is improved. -Have counseled him on diet, he does not seem to understand. Will request a nutrition consultation. -Continue Lasix 60 mg twice a day. -Cardiology recommends at least another 24 hours on IV Lasix.  Atrial fibrillation -Continue metoprolol 75 mg twice a day, is on Xarelto for anticoagulation.  Acute renal failure -Likely secondary to diuresis, monitor.  Hypertension -Remains uncontrolled. -Hopefully will continue to improve diuresis.  -continue Toprol, losartan, Aldactone.  Hyperlipidemia -Continue statin  Code Status: Full code  Family Communication: Patient only  Disposition Plan: To be determined  Consultants:  Cardiology  Antibiotics:  None   Subjective: States he feels well, freely admits to not taking medications as prescribed because they "increase my fluid"   Objective: Filed Vitals:   10/14/15 0554 10/14/15 0725 10/14/15 1037 10/14/15 1326  BP: 132/83   138/84  Pulse: 85   79  Temp: 98.3 F (36.8 C)   98.5 F (36.9 C)  TempSrc: Oral   Oral  Resp: 18   18  Height:      Weight:   128.368 kg (283 lb)   SpO2: 99% 98%  97%    Intake/Output Summary (Last 24 hours) at 10/14/15 1532 Last data filed at 10/14/15 1300  Gross per 24 hour  Intake    720 ml  Output   1400 ml  Net   -680 ml   Filed Weights     10/10/15 0533 10/11/15 0300 10/14/15 1037  Weight: 135.263 kg (298 lb 3.2 oz) 135.127 kg (297 lb 14.4 oz) 128.368 kg (283 lb)    Exam:   General:  Alert, awake, oriented 3, wears dark glasses even in the room   Cardiovascular: Regular rate and rhythm   Respiratory: Clear to auscultation bilaterally   Abdomen: Obese, soft, nontender, nondistended, positive bowel sounds   Extremities: 3+ pitting edema bilaterally  Neurologic:  Grossly intact and nonfocal  Data Reviewed: Basic Metabolic Panel:  Recent Labs Lab 10/10/15 0554 10/11/15 0609 10/12/15 0623 10/13/15 0551 10/14/15 0445  NA 145 144 145 145 145  K 4.1 3.6 3.7 3.6 3.7  CL 108 108 108 109 108  CO2 GLUCOSE 79 90 87 80 82  BUN 27* 28* 27* 27* 25*  CREATININE 1.40* 1.36* 1.40* 1.14 1.20  CALCIUM 8.9 8.8* 9.0 8.8* 8.9   Liver Function Tests:  Recent Labs Lab 10/08/15 0615  AST 28  ALT 27  ALKPHOS 66  BILITOT 1.6*  PROT 6.8  ALBUMIN 3.7   No results for input(s): LIPASE, AMYLASE in the last 168 hours. No results for input(s): AMMONIA in the last 168 hours. CBC:  Recent Labs Lab 10/07/15 1741 10/08/15 0615  WBC 3.3* 4.0  HGB 11.5* 12.7*  HCT 35.7* 38.9*  MCV 86.9 88.0  PLT 195 226   Cardiac Enzymes:  Recent Labs Lab 10/07/15 1741  TROPONINI  0.03   BNP (last 3 results)  Recent Labs  09/26/15 0611 09/28/15 1119 10/07/15 1741  BNP 620.0* 845.7* 616.0*    ProBNP (last 3 results) No results for input(s): PROBNP in the last 8760 hours.  CBG: No results for input(s): GLUCAP in the last 168 hours.  No results found for this or any previous visit (from the past 240 hour(s)).   Studies: No results found.  Scheduled Meds: . atorvastatin  80 mg Oral q1800  . budesonide (PULMICORT) nebulizer solution  0.25 mg Nebulization BID  . furosemide  60 mg Intravenous Q12H  . losartan  100 mg Oral Daily  . metoprolol succinate  75 mg Oral BID  . potassium chloride SA  20 mEq  Oral BID  . rivaroxaban  20 mg Oral Q supper  . sodium chloride flush  3 mL Intravenous Q12H  . sodium chloride flush  3 mL Intravenous Q12H  . spironolactone  12.5 mg Oral Daily   Continuous Infusions:   Principal Problem:   Acute on chronic combined systolic and diastolic heart failure (HCC) Active Problems:   OBESITY, MORBID   Essential hypertension   Sleep apnea   Noncompliance with medication regimen   Acute respiratory failure with hypoxia (HCC)   Chronic atrial fibrillation (HCC)   Pulmonary hypertension (HCC)   Acute exacerbation of CHF (congestive heart failure) (HCC)    Time spent: 25 minutes.  Greater than 50% of this time was spent in direct contact with the patient coordinating care.    Chaya Jan  [facility] Pager [phone]  If 7PM-7AM, please contact night-coverage at [email or web address], password [facility] 10/14/2015, 3:32 PM  LOS: 5 days

## 2015-10-14 NOTE — Plan of Care (Signed)
Problem: Food- and Nutrition-Related Knowledge Deficit (NB-1.1) Goal: Nutrition education Formal process to instruct or train a patient/client in a skill or to impart knowledge to help patients/clients voluntarily manage or modify food choices and eating behavior to maintain or improve health.  Outcome: Adequate for Discharge Nutrition Education Note  RD consulted for nutrition education regarding acute on chronic onset CHF. Patient complains of chronic lower extremity edema.  RD provided "Low Sodium Nutrition Therapy" handout reviewfrom the Academy of [employer] Dietetics. Reviewed patient's dietary recall. His daily food intake consists of fried chicken, hamburgers, hotdogs and french fries etc. Pt says he has " no control" over what people put in his food. Encouraged him to eat at home more often and limit his intake of fast foods which are typically very high in sodium. Reviewed with him the goal of consuming approximately 500 mg of sodium per meal and explained that 1 slice of American cheese has 400 mg of sodium and 2 TBSP of [place] has 335 mg sodium. A typical hotdog (just the frankfurter) has 510 mg sodium. He uses canned vegetables and we talked about the benefit of draining the liquid, rinsing and using fresh water to prepare them.  Discouraged use of salt shaker which he denies. Encouraged him to take in  fresh fruits and vegetables as well as whole grain sources of carbohydrates to maximize fiber intake.   RD discussed why it is important for patient to adhere to diet recommendations, and emphasized the role of fluids, foods to avoid, and importance of weighing self daily. Teach back method used.  Expect fair compliance. Pt seems to be in denial about the connection between what he is eating and his fluid maintenance. Repeats that "they" just need to adjust my medication. Very concerned that the pt is going back out in the community and resume his previous diet meal pattern which is very  high in sodium.    Royann Shivers MS,RD,CSG,LDN Office: [phone] Pager: [phone]

## 2015-10-14 NOTE — Progress Notes (Signed)
   Subjective: Breathing OK  NO CP   Objective: Filed Vitals:   10/13/15 2017 10/13/15 2214 10/14/15 0554 10/14/15 0725  BP:  153/119 132/83   Pulse:   85   Temp:  98.3 F (36.8 C) 98.3 F (36.8 C)   TempSrc:  Oral Oral   Resp:  18 18   Height:      Weight:      SpO2: 94% 96% 99% 98%   Weight change:   Intake/Output Summary (Last 24 hours) at 10/14/15 1024 Last data filed at 10/14/15 0700  Gross per 24 hour  Intake    720 ml  Output   2600 ml  Net  -1880 ml   Net neg 13/1 L    General: Alert, awake, oriented x3, in no acute distress Neck:  JVP is mildly increase   Heart: Regular rate and rhythm, without murmurs, rubs, gallops.  Lungs: Clear to auscultation.  No rales or wheezes. Exemities:  1+ edema.   Neuro: Grossly intact, nonfocal.  Tele:  Afib  80s    Lab Results: Results for orders placed or performed during the hospital encounter of 10/07/15 (from the past 24 hour(s))  Basic metabolic panel     Status: Abnormal   Collection Time: 10/14/15  4:45 AM  Result Value Ref Range   Sodium 145 135 - 145 mmol/L   Potassium 3.7 3.5 - 5.1 mmol/L   Chloride 108 101 - 111 mmol/L   CO2 28 22 - 32 mmol/L   Glucose, Bld 82 65 - 99 mg/dL   BUN 25 (H) 6 - 20 mg/dL   Creatinine, Ser 8.87 0.61 - 1.24 mg/dL   Calcium 8.9 8.9 - 57.9 mg/dL   GFR calc non Af Amer >60 >60 mL/min   GFR calc Af Amer >60 >60 mL/min   Anion gap 9 5 - 15    Studies/Results: No results found.  Medications: Reviewed  @PROBHOSP @  1  Acute on chronic systolic CHF  LVEF is 20 to 25%  Improved  13 L negatvie  Still up some  I would give 1` more day    2.  Atrial fib  Persistent  Rates controlled  Continue Xarelto    3.  HTN  BP is fair    4.  Noncompliance  Will have dietary see AGAIN  Pt does not appear to understand  Need to talk to his mom who he lives with    LOS: 5 days   Dietrich Pates 10/14/2015, 10:24 AM

## 2015-10-15 ENCOUNTER — Other Ambulatory Visit: Payer: Self-pay | Admitting: Adult Health

## 2015-10-15 DIAGNOSIS — I5043 Acute on chronic combined systolic (congestive) and diastolic (congestive) heart failure: Secondary | ICD-10-CM

## 2015-10-15 DIAGNOSIS — I519 Heart disease, unspecified: Secondary | ICD-10-CM

## 2015-10-15 DIAGNOSIS — J9601 Acute respiratory failure with hypoxia: Secondary | ICD-10-CM

## 2015-10-15 DIAGNOSIS — Z79899 Other long term (current) drug therapy: Secondary | ICD-10-CM

## 2015-10-15 DIAGNOSIS — Z713 Dietary counseling and surveillance: Secondary | ICD-10-CM

## 2015-10-15 DIAGNOSIS — I272 Other secondary pulmonary hypertension: Secondary | ICD-10-CM

## 2015-10-15 DIAGNOSIS — G4730 Sleep apnea, unspecified: Secondary | ICD-10-CM

## 2015-10-15 DIAGNOSIS — Z9114 Patient's other noncompliance with medication regimen: Secondary | ICD-10-CM

## 2015-10-15 MED ORDER — TORSEMIDE 20 MG PO TABS: 40.0000 mg | ORAL_TABLET | Freq: Two times a day (BID) | ORAL | Status: DC

## 2015-10-15 MED ORDER — TORSEMIDE 20 MG PO TABS
40.0000 mg | ORAL_TABLET | Freq: Two times a day (BID) | ORAL | Status: DC
  Filled 2015-10-15: qty 2

## 2015-10-15 MED ORDER — SPIRONOLACTONE 25 MG PO TABS: 12.5000 mg | ORAL_TABLET | Freq: Every day | ORAL | Status: DC

## 2015-10-15 MED ORDER — POTASSIUM CHLORIDE CRYS ER 20 MEQ PO TBCR: 20.0000 meq | EXTENDED_RELEASE_TABLET | Freq: Every day | ORAL | Status: DC

## 2015-10-15 NOTE — Care Management Note (Signed)
Case Management Note  Patient Details  Name: [name] MRN: [id number] Date of Birth: [birth year]   Expected Discharge Date:       10/15/2015           Expected Discharge Plan:  Home w Home Health Services  In-House Referral:  NA  Discharge planning Services  CM Consult  Post Acute Care Choice:  Home Health Choice offered to:  Patient  DME Arranged:    DME Agency:     HH Arranged:  RN HH Agency:  [facility]  Status of Service:  Completed, signed off  Medicare Important Message Given:  Yes Date Medicare IM Given:    Medicare IM give by:    Date Additional Medicare IM Given:    Additional Medicare Important Message give by:     If discussed at Long Length of Stay Meetings, dates discussed:  10/15/2015  Additional Comments: Pt discharging today. HH agency changed to [facility] as pt will be made HRI and Frances Furbish is [facility] agency this week. Pt understands and is agreeable to change in [facility] agency. Pt understands visits will be made daily at [facility]. Pt aware of change in PCP. No further CM needs at this time. Volney Presser, of [place], aware of DC plan, and will obtain pt info from chart.   [name], RN 10/15/2015, 1:40 PM

## 2015-10-15 NOTE — Progress Notes (Signed)
Paged on call MD Lovell Sheehan and made him aware of pt 18 beat run of [place]. Checked on pt who just got out the shower. Pt stable, A/O x4 siting in the chair on the phone. Denies chest pain denies any discomfort at this time.

## 2015-10-15 NOTE — Care Management Important Message (Signed)
Important Message  Patient Details  Name: [name] MRN: [id number] Date of Birth: 1965-09-19   Medicare Important Message Given:  Yes    [name], RN 10/15/2015, 1:44 PM

## 2015-10-15 NOTE — Progress Notes (Signed)
Primary Cardiologist: Prentice Docker MD  Cardiology Specific Problem List: 1. Acute on Chronic Mixed CHF 2. NICM 3. Atrial fibrillation  Subjective:   Feeling better, wants to go home.    Objective:   Temp:  [97.6 F (36.4 C)-98.5 F (36.9 C)] 97.6 F (36.4 C) (04/25 0500) Pulse Rate:  [72-79] 72 (04/25 0500) Resp:  [18] 18 (04/25 0500) BP: (135-138)/(84-104) 135/104 mmHg (04/25 0500) SpO2:  [97 %-100 %] 100 % (04/25 0715) Weight:  [283 lb (128.368 kg)] 283 lb (128.368 kg) (04/24 1037) Last BM Date: 10/13/15  Filed Weights   10/10/15 0533 10/11/15 0300 10/14/15 1037  Weight: 298 lb 3.2 oz (135.263 kg) 297 lb 14.4 oz (135.127 kg) 283 lb (128.368 kg)    Intake/Output Summary (Last 24 hours) at 10/15/15 0946 Last data filed at 10/15/15 0100  Gross per 24 hour  Intake    480 ml  Output    850 ml  Net   -370 ml    Telemetry: One episode of NSVT resolving on its own asymptomatic.  Exam:  General: No acute distress.  HEENT: Conjunctiva and lids normal, oropharynx clear.  Lungs: Clear to auscultation, nonlabored.  Cardiac: No elevated JVP or bruits. IRRR, no gallop or rub.   Abdomen: Normoactive bowel sounds, nontender, nondistended.  Extremities: No pitting edema, distal pulses full. Woody thickened skin and appearance bilaterally  Neuropsychiatric: Alert and oriented x3, affect appropriate.   Lab Results:  Basic Metabolic Panel:  Recent Labs Lab 10/12/15 0623 10/13/15 0551 10/14/15 0445  NA 145 145 145  K 3.7 3.6 3.7  CL 108 109 108  CO2 27 27 28   GLUCOSE 87 80 82  BUN 27* 27* 25*  CREATININE 1.40* 1.14 1.20  CALCIUM 9.0 8.8* 8.9     Medications:   Scheduled Medications: . atorvastatin  80 mg Oral q1800  . budesonide (PULMICORT) nebulizer solution  0.25 mg Nebulization BID  . losartan  100 mg Oral Daily  . metoprolol succinate  75 mg Oral BID  . potassium chloride SA  20 mEq Oral BID  . potassium chloride  20 mEq Oral Daily  .  rivaroxaban  20 mg Oral Q supper  . sodium chloride flush  3 mL Intravenous Q12H  . sodium chloride flush  3 mL Intravenous Q12H  . spironolactone  12.5 mg Oral Daily  . torsemide  40 mg Oral BID      PRN Medications: sodium chloride, albuterol, ondansetron **OR** ondansetron (ZOFRAN) IV, sodium chloride flush   Assessment and Plan:   1. Acute on Chronic Mixed CHF: He has had frequent admissions for the same due to dietary and medical noncompliance. He has been diuresed and has lost weight. Total diuresis of 13.5 L, weight is down 17 pounds since admission. He has been seen by dietary to discuss low-sodium diet. We'll transition him to by mouth torsemide, continue spironolactone. Losartan, he is a patient that would benefit Entresto,  however, with medical noncompliance uncertain if changing medications will provide better outcome.   I have spoken with him at length this morning concerning worsening systolic function, dietary and medical noncompliance issues, and need to make a decision whether he wishes to be a part of palliative care and/or hospice. This is done to express the gravity of his current health status, and to have him begin to think about decisions concerning his future admissions and prognosis.  He verbalizes understanding. He will need close follow-up in the office after discharge. He also  has been set up for home health.  Follow-up appointment and BMET ordered prior to discharge. We'll start him on a low dose of potassium. Noted that he is on spironolactone but he is on high-dose diuretics.   2. NICM: Most recent echocardiogram dated 09/30/2015 revealed an EF of 2025% with diffuse hypokinesis. Severe concentric hypertrophy. I've explained this to him at bedside. He is currently not a candidate for ICD pacemaker due to dietary and noncompliance issues. If he begins to be more serious about his health and taking his medications and watching his diet he may be considered for this in  the future. We will evaluate on follow-up appointments.  3. Atrial fibrillation: Heart rate is currently well controlled on beta blocker. Would not use calcium channel blocker in the setting of significantly reduced systolic function. He will continue on Xarelto.   Bettey Mare. Lawrence NP [facility]  10/15/2015, 9:46 AM   The patient was seen and examined, and I agree with the physical exam, assessment and plan as documented above which has been discussed with Harriet Pho NP, with modifications as noted below. Pt well known to me from clinic, with a long h/o dietary and medication non-compliance. I previously attempted to get social services involved because I felt he was a danger to himself. EF is now severely reduced due to hypertensive cardiomyopathy. Noncompliance precludes ICD candidacy, and most recent data questions validity of ICD's in nonischemic cardiomyopathic patients in general.  Agree with transition to oral meds today. He has diuresed sufficiently while hospitalized. No further recommendations. Will sign off.  Prentice Docker, MD, [facility]  10/15/2015 10:20 AM

## 2015-10-15 NOTE — Discharge Instructions (Signed)
Edema °Edema is an abnormal buildup of fluids. It is more common in your legs and thighs. Painless swelling of the feet and ankles is more likely as a person ages. It also is common in looser skin, like around your eyes. °HOME CARE  °· Keep the affected body part above the level of the heart while lying down. °· Do not sit still or stand for a long time. °· Do not put anything right under your knees when you lie down. °· Do not wear tight clothes on your upper legs. °· Exercise your legs to help the puffiness (swelling) go down. °· Wear elastic bandages or support stockings as told by your doctor. °· A low-salt diet may help lessen the puffiness. °· Only take medicine as told by your doctor. °GET HELP IF: °· Treatment is not working. °· You have heart, liver, or kidney disease and notice that your skin looks puffy or shiny. °· You have puffiness in your legs that does not get better when you raise your legs. °· You have sudden weight gain for no reason. °GET HELP RIGHT AWAY IF:  °· You have shortness of breath or chest pain. °· You cannot breathe when you lie down. °· You have pain, redness, or warmth in the areas that are puffy. °· You have heart, liver, or kidney disease and get edema all of a sudden. °· You have a fever and your symptoms get worse all of a sudden. °MAKE SURE YOU:  °· Understand these instructions. °· Will watch your condition. °· Will get help right away if you are not doing well or get worse. °  °This information is not intended to replace advice given to you by your health care provider. Make sure you discuss any questions you have with your health care provider. °  °Document Released: 11/25/2007 Document Revised: 06/13/2013 Document Reviewed: 03/31/2013 °Elsevier Interactive Patient Education ©2016 [employer] ° °

## 2015-10-15 NOTE — Discharge Summary (Signed)
Physician Discharge Summary  [name] ZOX:[id number] DOB: February 24, 1966 DOA: 10/07/2015  [facility]: [name]  Admit date: 10/07/2015 Discharge date: 10/15/2015  Time spent: 45 minutes  Recommendations for Outpatient Follow-up:  -Will be discharged home today. -Has follow-up scheduled in one week with cardiology.   Discharge Diagnoses:  Principal Problem:   Acute on chronic combined systolic and diastolic heart failure (HCC) Active Problems:   OBESITY, MORBID   Essential hypertension   Sleep apnea   Noncompliance with medication regimen   Acute respiratory failure with hypoxia (HCC)   Chronic atrial fibrillation (HCC)   Pulmonary hypertension (HCC)   Acute exacerbation of CHF (congestive heart failure) (HCC)   Discharge Condition: Stable and improved  Filed Weights   10/10/15 0533 10/11/15 0300 10/14/15 1037  Weight: 135.263 kg (298 lb 3.2 oz) 135.127 kg (297 lb 14.4 oz) 128.368 kg (283 lb)    History of present illness:  As per Dr. Sharl Ma on 4/17: 50 year old male who  has a past medical history of Hypertensive heart disease; Hyperlipidemia; Hypertension; Gastroesophageal reflux disease; Osteoarthritis; Peptic ulcer disease; Morbid obesity (HCC); Allergic rhinitis; Tobacco abuse, in remission; CHF (congestive heart failure) (HCC); A-fib (HCC); Asthma; Noncompliance; Pulmonary hypertension (HCC) (09/19/2015); and Chronic diastolic CHF (congestive heart failure) (HCC). Today came to the hospital with chief complaint of shortness of breath for past several days. Patient recently saw his cardiologist 5 days ago at that time Lasix was switched from twice a day through 3 times a day. Patient has not been compliant with taking Lasix as he felt that Lasix was making him to retain more fluid in the legs. Patient today weighs 309 pounds and states his baseline is 279 pounds. He also complains of some nausea. No vomiting. No fever. Denies any chest pain. Shortness of breath  on exertion. Patient also has difficulty lying flat. In the ED he received Lasix and has been diuresing well. O2 sats on room air 97%. On ambulation dropped to 89%.   Hospital Course:   Acute on chronic systolic CHF -Echo this admission shows an ejection fraction of 20-25% which is substantially decreased from 55% just 4 months ago in December 2016. Study was not sufficient to determine diastolic function. -Is currently being seen by cardiology. We suspect that his decreased ejection fraction is due to his severe noncompliance with diet and medications, which he admits to.  -His diet and medical noncompliance preclude ICD placement at this time. -He has diuresed 13.5 L since admission and his volume status is improved. -Has been transitioned over to [facility]. -High-Risk home health therapies have been arranged, outpatient follow-up with cardiology in one week has also been arranged. -Continue beta blocker, ACE inhibitor, spironolactone, Demadex.   Atrial fibrillation -Continue metoprolol 75 mg twice a day, is on Xarelto for anticoagulation.  Acute renal failure -Likely secondary to diuresis, monitor.  Hypertension -Improved -Hopefully will continue to improve diuresis.  -continue Toprol, losartan, Aldactone.  Hyperlipidemia -Continue statin  Procedures:  None   Consultations:  Cardiology  Discharge Instructions  Discharge Instructions    Diet - low sodium heart healthy    Complete by:  As directed      Increase activity slowly    Complete by:  As directed             Medication List    STOP taking these medications        furosemide 40 MG tablet  Commonly known as:  LASIX  TAKE these medications        atorvastatin 80 MG tablet  Commonly known as:  LIPITOR  Take 1 tablet (80 mg total) by mouth daily at 6 PM.     beclomethasone 80 MCG/ACT inhaler  Commonly known as:  QVAR  Inhale 2 puffs into the lungs daily.     losartan 100 MG tablet  Commonly  known as:  COZAAR  Take 100 mg by mouth daily.     metoprolol succinate 50 MG 24 hr tablet  Commonly known as:  TOPROL-XL  Take 1 tablet (50 mg total) by mouth 2 (two) times daily. Take with or immediately following a meal.     multivitamin with minerals Tabs tablet  Take 1 tablet by mouth daily with breakfast.     potassium chloride SA 20 MEQ tablet  Commonly known as:  K-DUR,KLOR-CON  Take 1 tablet (20 mEq total) by mouth 2 (two) times daily. Take with furosemide     rivaroxaban 20 MG Tabs tablet  Commonly known as:  XARELTO  Take 1 tablet (20 mg total) by mouth daily with supper.     spironolactone 25 MG tablet  Commonly known as:  ALDACTONE  Take 0.5 tablets (12.5 mg total) by mouth daily.     torsemide 20 MG tablet  Commonly known as:  DEMADEX  Take 2 tablets (40 mg total) by mouth 2 (two) times daily.       Allergies  Allergen Reactions  . Bee Venom Shortness Of Breath and Swelling  . Aspirin Other (See Comments)    Chewable 81 mg tablets upsets patients stomach  . Banana Diarrhea  . Diltiazem Other (See Comments)    Makes heart beat fast  . Hydralazine Other (See Comments)    Patient states it causes Tachycardia  . Lipitor [Atorvastatin] Other (See Comments)    Nose bleed       Follow-up Information    Follow up with Pearson Grippe, MD On 10/25/2015.   Specialty:  Internal Medicine   Why:  @ 2pm   Contact information:   [address] [place] [address] [phone]       Follow up with Joni Reining, NP On 11/04/2015.   Specialties:  Nurse Practitioner, Radiology, Cardiology   Why:  2:30 pm   Contact information:   [address] [place] [address] [phone]       Follow up with [facility] In 1 week.   Why:  BMET   Contact information:   [address] [id number] [address] [place] [address] [phone]      Follow up with [facility].   Specialty:  Home Health Services   Contact  information:   [address] [place] [address] [phone]        The results of significant diagnostics from this hospitalization (including imaging, microbiology, ancillary and laboratory) are listed below for reference.    Significant Diagnostic Studies: Dg Chest 2 View  10/07/2015  CLINICAL DATA:  Congestive heart failure. Shortness of breath and leg swelling. EXAM: CHEST  2 VIEW COMPARISON:  09/28/2015 FINDINGS: The heart is enlarged. Mediastinal shadows are unremarkable. There is a small amount of fluid in the fissures but no frank edema or measurable effusion. The lungs are well aerated. No significant bone finding. IMPRESSION: Small amount of fluid in the fissures. No chest radiographic evidence of pronounced CHF. Electronically Signed   By: Paulina Fusi M.D.   On:  10/07/2015 18:19   Dg Chest 2 View  09/28/2015  CLINICAL DATA:  50 year old with current history of CHF, presenting with recurrent shortness of breath, chest pain, and bilateral lower extremity edema. Recent multiple hospitalizations for control of fluid status. EXAM: CHEST  2 VIEW COMPARISON:  09/26/2015 and earlier, including CTA chest 09/19/2015. FINDINGS: Cardiac silhouette markedly enlarged, unchanged. Pulmonary venous hypertension, improved since examination 2 days ago, without overt edema currently. Mild atelectasis at the right lung base, unchanged. No new pulmonary parenchymal abnormalities. IMPRESSION: 1. Pulmonary venous hypertension, improved since examination 2 days ago, without overt edema currently. 2. Stable mild right basilar atelectasis. No acute cardiopulmonary disease otherwise. Electronically Signed   By: Hulan Saas M.D.   On: 09/28/2015 11:59   Dg Chest 2 View  09/18/2015  CLINICAL DATA:  Shortness of Breath EXAM: CHEST  2 VIEW COMPARISON:  September 15, 2015 FINDINGS: There is no edema or consolidation. Heart is enlarged with pulmonary vascularity within normal limits, stable. No  adenopathy. No bone lesions. IMPRESSION: Stable cardiac enlargement.  No edema or consolidation. Electronically Signed   By: Bretta Bang III M.D.   On: 09/18/2015 20:14   Dg Ankle 2 Views Right  10/07/2015  CLINICAL DATA:  Pain and swelling, chronic EXAM: RIGHT ANKLE - 2 VIEW COMPARISON:  Right tibia and fibula March 26, 2011 FINDINGS: Frontal lateral views were obtained. There is marked soft tissue swelling in the ankle region, most severely medially and posteriorly, stable from 2012. No fracture or joint effusion. Ankle mortise appears intact. No appreciable joint space narrowing. There is a spur arising from the posterior calcaneus. IMPRESSION: Marked soft tissue swelling, most severe medially and posteriorly, unchanged from 2012. No fracture or appreciable joint space narrowing. Ankle mortise appears intact. Stable posterior calcaneal spur. Electronically Signed   By: Bretta Bang III M.D.   On: 10/07/2015 20:39   Ct Angio Chest Pe W/cm &/or Wo Cm  09/19/2015  CLINICAL DATA:  Shortness of breath. Lower leg swelling and light headed for 4 days. Initial encounter. EXAM: CT ANGIOGRAPHY CHEST WITH CONTRAST TECHNIQUE: Multidetector CT imaging of the chest was performed using the standard protocol during bolus administration of intravenous contrast. Multiplanar CT image reconstructions and MIPs were obtained to evaluate the vascular anatomy. CONTRAST:  120 ml Isovue 370. COMPARISON:  PA lateral chest 09/18/2015. FINDINGS: No pulmonary embolus is identified. There is marked cardiomegaly. No pleural or pericardial effusion. No axillary, hilar or mediastinal lymphadenopathy. The lungs demonstrate minimal dependent atelectasis. 2-3 small calcified granulomata are seen in the right lower lobe and right middle lobe. There is partial visualization of a cyst in the upper pole the right kidney. Imaged upper abdomen is otherwise unremarkable. Schmorl's node is noted the superior endplate of a lower thoracic  vertebral body. No worrisome bony lesion is seen. Review of the MIP images confirms the above findings. IMPRESSION: Negative for pulmonary embolus or acute disease. Marked cardiomegaly. Electronically Signed   By: Drusilla Kanner M.D.   On: 09/19/2015 18:44   Dg Chest Port 1 View  09/26/2015  CLINICAL DATA:  Shortness of breath this morning. EXAM: PORTABLE CHEST 1 VIEW COMPARISON:  Radiographs and CT 1 week prior. FINDINGS: Marked cardiomegaly again seen, stable. Questionable vascular congestion versus differences in technique. No confluent airspace disease, pleural effusion or pneumothorax. IMPRESSION: Chronic cardiomegaly.  Questionable vascular congestion. Electronically Signed   By: Rubye Oaks M.D.   On: 09/26/2015 07:01    Microbiology: No results found for this or any  previous visit (from the past 240 hour(s)).   Labs: Basic Metabolic Panel:  Recent Labs Lab 10/10/15 0554 10/11/15 0609 10/12/15 0623 10/13/15 0551 10/14/15 0445  NA 145 144 145 145 145  K 4.1 3.6 3.7 3.6 3.7  CL 108 108 108 109 108  CO2 GLUCOSE 79 90 87 80 82  BUN 27* 28* 27* 27* 25*  CREATININE 1.40* 1.36* 1.40* 1.14 1.20  CALCIUM 8.9 8.8* 9.0 8.8* 8.9   Liver Function Tests: No results for input(s): AST, ALT, ALKPHOS, BILITOT, PROT, ALBUMIN in the last 168 hours. No results for input(s): LIPASE, AMYLASE in the last 168 hours. No results for input(s): AMMONIA in the last 168 hours. CBC: No results for input(s): WBC, NEUTROABS, HGB, HCT, MCV, PLT in the last 168 hours. Cardiac Enzymes: No results for input(s): CKTOTAL, CKMB, CKMBINDEX, TROPONINI in the last 168 hours. BNP: BNP (last 3 results)  Recent Labs  09/26/15 0611 09/28/15 1119 10/07/15 1741  BNP 620.0* 845.7* 616.0*    ProBNP (last 3 results) No results for input(s): PROBNP in the last 8760 hours.  CBG: No results for input(s): GLUCAP in the last 168 hours.     SignedChaya Jan  [facility] Pager: [phone] 10/15/2015, 2:55 PM

## 2015-10-15 NOTE — Progress Notes (Signed)
Patient discharging home.  IV removed - WNL.  Reviewed DC instructions and medications.  Follow up appts in place.  Reviewed HF management at home.  Verbalizes understanding.  No questions at this time.

## 2015-10-21 ENCOUNTER — Telehealth: Payer: Self-pay | Admitting: Adult Health

## 2015-10-21 NOTE — Telephone Encounter (Signed)
Pt's home health nurse is calling concerned about the pt's weight gain. Pls call Darl Pikes @ [phone]

## 2015-10-21 NOTE — Telephone Encounter (Signed)
Wt at last visit 10/03/15 298 lbs  LM for [facility] Susan to call back-cc

## 2015-10-21 NOTE — Telephone Encounter (Signed)
Wt today at home is 305 lbs via [facility] scale nurse Rosalita Chessman  Pt never filled torsemide per hospital discharge but is STILL taking Lasix    Apt made tomorrow with NP

## 2015-10-22 ENCOUNTER — Emergency Department (HOSPITAL_COMMUNITY)
Admission: EM | Admit: 2015-10-22 | Discharge: 2015-10-22 | Disposition: A | Discharge status: Home / Self Care | Payer: Medicare Other | Attending: Emergency Medicine | Admitting: Emergency Medicine

## 2015-10-22 ENCOUNTER — Encounter: Payer: Medicare Other | Admitting: Adult Health

## 2015-10-22 ENCOUNTER — Encounter (HOSPITAL_COMMUNITY): Payer: Self-pay

## 2015-10-22 ENCOUNTER — Emergency Department (HOSPITAL_COMMUNITY): Payer: Medicare Other

## 2015-10-22 DIAGNOSIS — J45909 Unspecified asthma, uncomplicated: Secondary | ICD-10-CM | POA: Diagnosis not present

## 2015-10-22 DIAGNOSIS — I110 Hypertensive heart disease with heart failure: Secondary | ICD-10-CM | POA: Insufficient documentation

## 2015-10-22 DIAGNOSIS — Z79899 Other long term (current) drug therapy: Secondary | ICD-10-CM | POA: Insufficient documentation

## 2015-10-22 DIAGNOSIS — I4891 Unspecified atrial fibrillation: Secondary | ICD-10-CM | POA: Insufficient documentation

## 2015-10-22 DIAGNOSIS — M7989 Other specified soft tissue disorders: Secondary | ICD-10-CM | POA: Diagnosis present

## 2015-10-22 DIAGNOSIS — E785 Hyperlipidemia, unspecified: Secondary | ICD-10-CM | POA: Insufficient documentation

## 2015-10-22 DIAGNOSIS — Z87891 Personal history of nicotine dependence: Secondary | ICD-10-CM | POA: Insufficient documentation

## 2015-10-22 DIAGNOSIS — M1990 Unspecified osteoarthritis, unspecified site: Secondary | ICD-10-CM | POA: Insufficient documentation

## 2015-10-22 DIAGNOSIS — R0600 Dyspnea, unspecified: Secondary | ICD-10-CM | POA: Insufficient documentation

## 2015-10-22 DIAGNOSIS — I5032 Chronic diastolic (congestive) heart failure: Secondary | ICD-10-CM | POA: Diagnosis not present

## 2015-10-22 LAB — BASIC METABOLIC PANEL
Anion gap: 7 (ref 5–15)
BUN: 19 mg/dL (ref 6–20)
CALCIUM: 8.8 mg/dL — AB (ref 8.9–10.3)
CO2: 26 mmol/L (ref 22–32)
CREATININE: 1.26 mg/dL — AB (ref 0.61–1.24)
Chloride: 107 mmol/L (ref 101–111)
GFR calc non Af Amer: >60 mL/min (ref >60)
Glucose, Bld: 86 mg/dL (ref 65–99)
Potassium: 3.6 mmol/L (ref 3.5–5.1)
SODIUM: 140 mmol/L (ref 135–145)

## 2015-10-22 LAB — URINALYSIS, ROUTINE W REFLEX MICROSCOPIC
Bilirubin Urine: NEGATIVE
GLUCOSE, UA: NEGATIVE mg/dL
HGB URINE DIPSTICK: NEGATIVE
Ketones, ur: NEGATIVE mg/dL
Leukocytes, UA: NEGATIVE
Nitrite: NEGATIVE
PROTEIN: 100 mg/dL — AB
Specific Gravity, Urine: 1.025 (ref 1.005–1.030)
pH: 5.5 (ref 5.0–8.0)

## 2015-10-22 LAB — CBC WITH DIFFERENTIAL/PLATELET
BASOS PCT: 0 %
Basophils Absolute: 0 10*3/uL (ref 0.0–0.1)
EOS ABS: 0.1 10*3/uL (ref 0.0–0.7)
EOS PCT: 1 %
HCT: 36.6 % — ABNORMAL LOW (ref 39.0–52.0)
Hemoglobin: 11.8 g/dL — ABNORMAL LOW (ref 13.0–17.0)
Lymphocytes Relative: 34 %
Lymphs Abs: 1.2 10*3/uL (ref 0.7–4.0)
MCH: 28 pg (ref 26.0–34.0)
MCHC: 32.2 g/dL (ref 30.0–36.0)
MCV: 86.7 fL (ref 78.0–100.0)
MONO ABS: 0.3 10*3/uL (ref 0.1–1.0)
MONOS PCT: 8 %
Neutro Abs: 2 10*3/uL (ref 1.7–7.7)
Neutrophils Relative %: 57 %
Platelets: 202 10*3/uL (ref 150–400)
RBC: 4.22 MIL/uL (ref 4.22–5.81)
RDW: 14.7 % (ref 11.5–15.5)
WBC: 3.5 10*3/uL — ABNORMAL LOW (ref 4.0–10.5)

## 2015-10-22 LAB — URINE MICROSCOPIC-ADD ON
BACTERIA UA: NONE SEEN
WBC, UA: NONE SEEN WBC/hpf (ref 0–5)

## 2015-10-22 LAB — BRAIN NATRIURETIC PEPTIDE: B NATRIURETIC PEPTIDE 5: 724 pg/mL — AB (ref 0.0–100.0)

## 2015-10-22 LAB — TROPONIN I: TROPONIN I: 0.03 ng/mL (ref <0.031)

## 2015-10-22 MED ORDER — FUROSEMIDE 10 MG/ML IJ SOLN
40.0000 mg | Freq: Once | INTRAMUSCULAR | Status: AC
  Administered 2015-10-22: 40 mg via INTRAVENOUS

## 2015-10-22 MED ORDER — FUROSEMIDE 10 MG/ML IJ SOLN
40.0000 mg | Freq: Once | INTRAMUSCULAR | Status: DC
  Filled 2015-10-22: qty 4

## 2015-10-22 MED ORDER — FUROSEMIDE 10 MG/ML IJ SOLN
40.0000 mg | Freq: Once | INTRAMUSCULAR | Status: AC
  Administered 2015-10-22: 40 mg via INTRAVENOUS
  Filled 2015-10-22: qty 4

## 2015-10-22 NOTE — Discharge Instructions (Signed)
Appointment tomorrow at 1:20 PM with the cardiology office next door. You must make this appointment and bring ALL of your medications.

## 2015-10-22 NOTE — Progress Notes (Signed)
Error - no show

## 2015-10-22 NOTE — ED Notes (Signed)
Called Cardiology to schedule an appt.  Spoke with Dahlia Client and appt given for tomorrow 10/23/15 at 1:20pm with Herma Carson.  Dr. Adriana Simas informed.

## 2015-10-22 NOTE — ED Notes (Signed)
X-ray at bedside

## 2015-10-22 NOTE — ED Notes (Signed)
Pt med list updated by Kathie Dike at 1124.

## 2015-10-22 NOTE — ED Provider Notes (Signed)
CSN: [id number]     Arrival date & time 10/22/15  1029 History  By signing my name below, I, Ronney Lion, attest that this documentation has been prepared under the direction and in the presence of Donnetta Hutching, MD. Electronically Signed: Ronney Lion, ED Scribe. 10/22/2015. 11:36 AM.   Chief Complaint  Patient presents with  . Leg Swelling   The history is provided by the patient. No language interpreter was used.    HPI Comments: GYAN CAMBRE is a 50 y.o. male with a history of CHF, atrial fibrillation (currently anticoagulated on Xarelto), HLD, HTN, and morbid obesity,who presents to the Emergency Department complaining of bilateral leg swelling, right greater than left, that began several days ago. He also complains of associated SOB. Patient states he was admitted to the hospital for 1 week for the same symptoms about 2 weeks ago. Patient reports compliance with Xarelto, Lasix, and torsemide.   PCP: [facility]  Past Medical History  Diagnosis Date  . Hypertensive heart disease     Hypertensive heart disease with prior episodes of CHF  . Hyperlipidemia   . Hypertension   . Gastroesophageal reflux disease   . Osteoarthritis   . Peptic ulcer disease   . Morbid obesity (HCC)     /sleep apnea  . Allergic rhinitis     plus h/o asthma  . Tobacco abuse, in remission     Cigarettes discontinued in 2010  . CHF (congestive heart failure) (HCC)   . A-fib (HCC)     2015  . Asthma   . Noncompliance   . Pulmonary hypertension (HCC) 09/19/2015  . Chronic diastolic CHF (congestive heart failure) [facility])    Past Surgical History  Procedure Laterality Date  . None    . Cardiac catheterization N/A 06/14/2015    Procedure: Right/Left Heart Cath and Coronary Angiography;  Surgeon: Runell Gess, MD;  Location: [facility] INVASIVE CV LAB;  Service: Cardiovascular;  Laterality: N/A;   Family History  Problem Relation Age of Onset  . Colon cancer Neg Hx   . Inflammatory bowel disease Neg Hx   .  Liver disease Neg Hx   . Stroke Father   . Heart attack Father    Social History  Substance Use Topics  . Smoking status: Former Smoker -- 0.50 packs/day for 20 years    Types: Cigarettes    Start date: 04/26/1988    Quit date: 06/23/2007  . Smokeless tobacco: Never Used     Comment: 1 ppd former smoker  . Alcohol Use: No     Comment: no etoh since 2009    Review of Systems  Respiratory: Positive for shortness of breath.   Cardiovascular: Positive for leg swelling.  All other systems reviewed and are negative.     Allergies  Bee venom; Aspirin; Banana; Diltiazem; Hydralazine; and Lipitor  Home Medications   Prior to Admission medications   Medication Sig Start Date End Date Taking? Authorizing Provider  atorvastatin (LIPITOR) 80 MG tablet Take 1 tablet (80 mg total) by mouth daily at 6 PM. 09/15/15  Yes Estela Isaiah Blakes, MD  beclomethasone (QVAR) 80 MCG/ACT inhaler Inhale 2 puffs into the lungs daily.    Yes Historical Provider, MD  losartan (COZAAR) 100 MG tablet Take 100 mg by mouth daily.  09/16/15  Yes Historical Provider, MD  metoprolol succinate (TOPROL-XL) 50 MG 24 hr tablet Take 1 tablet (50 mg total) by mouth 2 (two) times daily. Take with or immediately following a meal.  10/03/15  Yes Jodelle Gross, NP  Multiple Vitamin (MULTIVITAMIN WITH MINERALS) TABS tablet Take 1 tablet by mouth daily with breakfast. 09/22/15  Yes Elliot Cousin, MD  potassium chloride SA (K-DUR,KLOR-CON) 20 MEQ tablet Take 1 tablet (20 mEq total) by mouth 2 (two) times daily. Take with furosemide 09/22/15  Yes Elliot Cousin, MD  rivaroxaban (XARELTO) 20 MG TABS tablet Take 1 tablet (20 mg total) by mouth daily with supper. Patient taking differently: Take 20 mg by mouth daily.  09/15/15  Yes Henderson Cloud, MD  spironolactone (ALDACTONE) 25 MG tablet Take 0.5 tablets (12.5 mg total) by mouth daily. 10/15/15  Yes Estela Isaiah Blakes, MD  torsemide (DEMADEX) 20 MG tablet Take  2 tablets (40 mg total) by mouth 2 (two) times daily. 10/15/15  Yes Henderson Cloud, MD   BP 144/111 mmHg  Pulse 106  Temp(Src) 97.7 F (36.5 C) (Oral)  Resp 16  Ht 6' (1.829 m)  Wt 320 lb (145.151 kg)  BMI 43.39 kg/m2  SpO2 94% Physical Exam  Constitutional: He is oriented to person, place, and time. He appears well-developed and well-nourished.  Obese.   HENT:  Head: Normocephalic and atraumatic.  Eyes: Conjunctivae and EOM are normal. Pupils are equal, round, and reactive to light.  Neck: Normal range of motion. Neck supple.  Cardiovascular: Normal rate and regular rhythm.   Pulmonary/Chest: Effort normal and breath sounds normal.  Abdominal: Soft. Bowel sounds are normal.  Musculoskeletal: Normal range of motion. He exhibits edema.  Significant BLE, right greater than left.   Neurological: He is alert and oriented to person, place, and time.  Skin: Skin is warm and dry.  Psychiatric: He has a normal mood and affect. His behavior is normal.  Nursing note and vitals reviewed.   ED Course  Procedures (including critical care time)  DIAGNOSTIC STUDIES: Oxygen Saturation is 100% on RA, normal by my interpretation.    COORDINATION OF CARE: 11:18 AM - Discussed treatment plan with pt at bedside which includes CXR, labs, and likely admission. Pt verbalized understanding and agreed to plan.   Labs Review Labs Reviewed  CBC WITH DIFFERENTIAL/PLATELET - Abnormal; Notable for the following:    WBC 3.5 (*)    Hemoglobin 11.8 (*)    HCT 36.6 (*)    All other components within normal limits  BASIC METABOLIC PANEL - Abnormal; Notable for the following:    Creatinine, Ser 1.26 (*)    Calcium 8.8 (*)    All other components within normal limits  BRAIN NATRIURETIC PEPTIDE - Abnormal; Notable for the following:    B Natriuretic Peptide 724.0 (*)    All other components within normal limits  URINALYSIS, ROUTINE W REFLEX MICROSCOPIC (NOT AT [facility]) - Abnormal; Notable for the  following:    Color, Urine AMBER (*)    Protein, ur 100 (*)    All other components within normal limits  URINE MICROSCOPIC-ADD ON - Abnormal; Notable for the following:    Squamous Epithelial / LPF 0-5 (*)    All other components within normal limits  TROPONIN I    Imaging Review Dg Chest Portable 1 View  10/22/2015  CLINICAL DATA:  Two day history of shortness of breath EXAM: PORTABLE CHEST 1 VIEW COMPARISON:  October 07, 2015 FINDINGS: There is no edema or consolidation. There is generalized cardiac enlargement with pulmonary vascularity within normal limits. No adenopathy. No bone lesions. IMPRESSION: Stable generalized cardiomegaly.  No edema or consolidation. Electronically Signed  By: Bretta Bang III M.D.   On: 10/22/2015 10:57   I have personally reviewed and evaluated these images and lab results as part of my medical decision-making.   EKG Interpretation   Date/Time:  Tuesday Oct 22 2015 10:52:09 EDT Ventricular Rate:  103 PR Interval:    QRS Duration: 89 QT Interval:  358 QTC Calculation: 469 R Axis:   53 Text Interpretation:  Atrial fibrillation Paired ventricular premature  complexes Nonspecific T abnormalities, lateral leads Confirmed by Adriana Simas   MD, Laroy Mustard ([id number]) on 10/22/2015 10:55:15 AM Also confirmed by Adriana Simas  MD,  Zinedine Ellner ([id number])  on 10/22/2015 10:57:45 AM      MDM   Final diagnoses:  Dyspnea   Will discuss findings with cardiology and general medicine.  Patient is not in heart failure. Chest x-ray shows no excessive fluid. Discussed findings with patient and his mother. Patient will follow-up on Wednesday, May 3 with the [facility] cardiologist. He is ambulatory without severe dyspnea on discharge    I personally performed the services described in this documentation, which was scribed in my presence. The recorded information has been reviewed and is accurate.      Donnetta Hutching, MD 10/23/15 [phone]

## 2015-10-22 NOTE — ED Notes (Signed)
Pt reports swelling in feet, legs, and abd for the past few days.  C/O abd pain, leg pain, and SOB.

## 2015-10-23 ENCOUNTER — Encounter: Payer: Self-pay | Admitting: Physician Assistant

## 2015-10-23 ENCOUNTER — Ambulatory Visit (INDEPENDENT_AMBULATORY_CARE_PROVIDER_SITE_OTHER): Payer: Medicare Other | Admitting: Physician Assistant

## 2015-10-23 VITALS — BP 150/90 | HR 63 | Ht 72.0 in | Wt 301.0 lb

## 2015-10-23 DIAGNOSIS — I5043 Acute on chronic combined systolic (congestive) and diastolic (congestive) heart failure: Secondary | ICD-10-CM

## 2015-10-23 DIAGNOSIS — I10 Essential (primary) hypertension: Secondary | ICD-10-CM | POA: Diagnosis not present

## 2015-10-23 DIAGNOSIS — I482 Chronic atrial fibrillation, unspecified: Secondary | ICD-10-CM

## 2015-10-23 DIAGNOSIS — Z9114 Patient's other noncompliance with medication regimen: Secondary | ICD-10-CM | POA: Diagnosis not present

## 2015-10-23 DIAGNOSIS — Z91148 Patient's other noncompliance with medication regimen for other reason: Secondary | ICD-10-CM

## 2015-10-23 NOTE — Patient Instructions (Signed)
Your physician recommends that you schedule a follow-up appointment on May 15 with Joni Reining, NP.   Your physician wants you to follow-up on July 3 with Dr. Purvis Sheffield. You will receive a reminder letter in the mail two months in advance. If you don't receive a letter, please call our office to schedule the follow-up appointment.  Your physician has recommended you make the following change in your medication:   Take Demadex 20 mg Two Times Daily   Take Xarelto 20 mg Daily   If you need a refill on your cardiac medications before your next appointment, please call your pharmacy.  Thank you for choosing [facility]!

## 2015-10-23 NOTE — Progress Notes (Signed)
Cardiology Office Note    Date:  10/23/2015   ID:  [name], DOB [birth year], MRN [id number]  PCP:  No PCP Per Patient  Cardiologist:  Dr. Purvis Sheffield Chief complaint weight gain  History of Present Illness:  [name] is a 50 y.o. male who had recent hospitalization with acute on chronic CHF due to dietary medical noncompliance. He diuresed a total of 13.5 L and weight was down 17 pounds since admission. He was placed on torsemide but when he went home he stop this and started Lasix.   He went to the emergency room yesterday where he was felt not to be in Overt heart failure. Patient was taking Lasix instead of torsemide when he went home. He has gained weight. Most recent echo in the hospital 09/30/15 EF was 20-25% with diffuse hypokinesis and severe concentric LVH. This was worse from echo in 05/2015 and felt secondary to hypertensive cardiomyopathy and noncompliance. Patient also has chronic atrial fibrillation controlled with beta blocker and on Xarelto. Creatinine yesterday was 1.26 BNP 724, weight 320, chest x-ray without edema. Discharge weight was 283 pounds, weighs 301 today. He says he only took torsemide 1 tablet this morning for the first time. He didn't think his heart could take 2 tablets twice a day. He also only takes Xarelto every other day because he forgets to take a moment time. He's supposed to have a home health nurse coming out to help him keep track of his medications. He knows he is noncompliant but doesn't seem to want to change.    Past Medical History  Diagnosis Date  . Hypertensive heart disease     Hypertensive heart disease with prior episodes of CHF  . Hyperlipidemia   . Hypertension   . Gastroesophageal reflux disease   . Osteoarthritis   . Peptic ulcer disease   . Morbid obesity (HCC)     /sleep apnea  . Allergic rhinitis     plus h/o asthma  . Tobacco abuse, in remission     Cigarettes discontinued in 2010  . CHF (congestive heart failure)  (HCC)   . A-fib (HCC)     2015  . Asthma   . Noncompliance   . Pulmonary hypertension (HCC) 09/19/2015  . Chronic diastolic CHF (congestive heart failure) [facility])     Past Surgical History  Procedure Laterality Date  . None    . Cardiac catheterization N/A 06/14/2015    Procedure: Right/Left Heart Cath and Coronary Angiography;  Surgeon: Runell Gess, MD;  Location: [facility] INVASIVE CV LAB;  Service: Cardiovascular;  Laterality: N/A;    Current Medications: Outpatient Prescriptions Prior to Visit  Medication Sig Dispense Refill  . atorvastatin (LIPITOR) 80 MG tablet Take 1 tablet (80 mg total) by mouth daily at 6 PM. 30 tablet 2  . beclomethasone (QVAR) 80 MCG/ACT inhaler Inhale 2 puffs into the lungs daily.     Marland Kitchen losartan (COZAAR) 100 MG tablet Take 100 mg by mouth daily.   1  . metoprolol succinate (TOPROL-XL) 50 MG 24 hr tablet Take 1 tablet (50 mg total) by mouth 2 (two) times daily. Take with or immediately following a meal. 180 tablet 3  . potassium chloride SA (K-DUR,KLOR-CON) 20 MEQ tablet Take 1 tablet (20 mEq total) by mouth 2 (two) times daily. Take with furosemide 60 tablet 2  . rivaroxaban (XARELTO) 20 MG TABS tablet Take 1 tablet (20 mg total) by mouth daily with supper. (Patient taking differently: Take 20 mg by  mouth daily. ) 30 tablet 2  . spironolactone (ALDACTONE) 25 MG tablet Take 0.5 tablets (12.5 mg total) by mouth daily. 30 tablet 0  . torsemide (DEMADEX) 20 MG tablet Take 2 tablets (40 mg total) by mouth 2 (two) times daily. 60 tablet 2  . Multiple Vitamin (MULTIVITAMIN WITH MINERALS) TABS tablet Take 1 tablet by mouth daily with breakfast.     No facility-administered medications prior to visit.     Allergies:   Bee venom; Aspirin; Banana; Diltiazem; Hydralazine; and Lipitor   Social History   Social History  . Marital Status: Divorced    Spouse Name: N/A  . Number of Children: 3  . Years of Education: N/A   Occupational History  . disabled    Social  History Main Topics  . Smoking status: Former Smoker -- 0.50 packs/day for 20 years    Types: Cigarettes    Start date: 04/26/1988    Quit date: 06/23/2007  . Smokeless tobacco: Never Used     Comment: 1 ppd former smoker  . Alcohol Use: No     Comment: no etoh since 2009  . Drug Use: No  . Sexual Activity: No   Other Topics Concern  . None   Social History Narrative     Family History:  The patient's    family history includes Heart attack in his father; Stroke in his father. There is no history of Colon cancer, Inflammatory bowel disease, or Liver disease.   ROS:   Please see the history of present illness.    Review of Systems  Constitution: Positive for weight gain.  Cardiovascular: Positive for irregular heartbeat and palpitations.  Respiratory: Positive for shortness of breath.   Endocrine: Negative.   Hematologic/Lymphatic: Negative.   Musculoskeletal: Negative.   Gastrointestinal: Negative.   Genitourinary: Negative.   Neurological: Negative.    All other systems reviewed and are negative.   PHYSICAL EXAM:   VS:  BP 150/90 mmHg  Pulse 63  Ht 6' (1.829 m)  Wt 301 lb (136.533 kg)  BMI 40.81 kg/m2  SpO2 97%   GEN: Well nourished, well developed, in no acute distress Neck: no JVD, carotid bruits, or masses Cardiac: Irregular irregular no murmurs, rubs, or gallops, +3-4 edema all the way up his legs Respiratory:  clear to auscultation bilaterally, normal work of breathing GI: soft, nontender, nondistended, + BS MS: no deformity or atrophy Skin: warm and dry, no rash Neuro:  Alert and Oriented x 3,   Wt Readings from Last 3 Encounters:  10/23/15 301 lb (136.533 kg)  10/22/15 320 lb (145.151 kg)  10/14/15 283 lb (128.368 kg)      Studies/Labs Reviewed:   EKG:  EKG is not ordered today.   Recent Labs: 09/08/2015: Magnesium 2.0 09/19/2015: TSH 0.626 10/08/2015: ALT 27 10/22/2015: B Natriuretic Peptide 724.0*; BUN 19; Creatinine, Ser 1.26*; Hemoglobin 11.8*;  Platelets 202; Potassium 3.6; Sodium 140   Lipid Panel    Component Value Date/Time   CHOL 139 06/14/2015 0328   TRIG 58 06/14/2015 0328   HDL 28* 06/14/2015 0328   CHOLHDL 5.0 06/14/2015 0328   VLDL 12 06/14/2015 0328   LDLCALC 99 06/14/2015 0328    Additional studies/ records that were reviewed today include:   2-D echo 09/30/15 Study Conclusions   - Left ventricle: The cavity size was normal. There was severe   concentric hypertrophy. Systolic function was severely reduced.   The estimated ejection fraction was in the range of 20% to 25%.  Diffuse hypokinesis. The study is not technically sufficient to   allow evaluation of LV diastolic function. - Mitral valve: Mildly thickened leaflets . There was trivial   regurgitation. - Left atrium: Moderately dilated at 47 ml/m2. - Right ventricle: The cavity size was mildly dilated. Systolic   function was normal. - Right atrium: Severely dilated at 29 cm2. - Tricuspid valve: There was mild regurgitation. - Pulmonary arteries: PA peak pressure: 49 mm Hg (S). - Inferior vena cava: The vessel was dilated. The respirophasic   diameter changes were blunted (< 50%), consistent with elevated   central venous pressure.   Impressions:   - Compared to a prior study in 05/2015, the LVEF has declined to   20-25% with global hypokinesis. A-fib is noted to be present.      ASSESSMENT:    1. Acute on chronic combined systolic and diastolic heart failure (HCC)   2. Chronic atrial fibrillation (HCC)   3. Essential hypertension   4. Noncompliance with medication regimen   5. Morbid obesity due to excess calories (HCC)      PLAN:  In order of problems listed above:  Patient has acute on chronic heart failure. His weight is up 18 pounds since recent hospitalization for heart failure. He did not take the Demadex as instructed to resume Lasix. I had along discussion with him about compliance. His EF is now further reduced to 20-25%. He  only took one Demadex today. Increase Demadex 20 mg to 2 tablets twice daily. Stop Lasix. He has follow-up scheduled in 2 weeks.Dr. Purvis Sheffield in 1 month.  Chronic atrial fibrillation with rate control on metoprolol  Hypertension blood pressure is elevated today due to noncompliance. Long discussion with patient about compliance issues and weight loss.        Medication Adjustments/Labs and Tests Ordered: Current medicines are reviewed at length with the patient today.  Concerns regarding medicines are outlined above.  Medication changes, Labs and Tests ordered today are listed in the Patient Instructions below. Patient Instructions  Your physician recommends that you schedule a follow-up appointment on May 15 with Joni Reining, NP.   Your physician wants you to follow-up on July 3 with Dr. Purvis Sheffield. You will receive a reminder letter in the mail two months in advance. If you don't receive a letter, please call our office to schedule the follow-up appointment.  Your physician has recommended you make the following change in your medication:   Take Demadex 20 mg Two Times Daily   Take Xarelto 20 mg Daily   If you need a refill on your cardiac medications before your next appointment, please call your pharmacy.  Thank you for choosing [facility]!        Signed, Samuel Reedy, PA-C  10/23/2015 2:56 PM    [facility] [address] [place], [place], [place]  [address] Phone: [phone]; Fax: [phone]

## 2015-10-24 ENCOUNTER — Emergency Department (HOSPITAL_COMMUNITY): Payer: Medicare Other

## 2015-10-24 ENCOUNTER — Encounter (HOSPITAL_COMMUNITY): Payer: Self-pay | Admitting: Emergency Medicine

## 2015-10-24 ENCOUNTER — Emergency Department (HOSPITAL_COMMUNITY)
Admission: EM | Admit: 2015-10-24 | Discharge: 2015-10-24 | Disposition: A | Discharge status: Home / Self Care | Payer: Medicare Other | Source: Home / Self Care | Attending: Emergency Medicine | Admitting: Emergency Medicine

## 2015-10-24 DIAGNOSIS — I130 Hypertensive heart and chronic kidney disease with heart failure and stage 1 through stage 4 chronic kidney disease, or unspecified chronic kidney disease: Principal | ICD-10-CM | POA: Diagnosis present

## 2015-10-24 DIAGNOSIS — J189 Pneumonia, unspecified organism: Secondary | ICD-10-CM | POA: Diagnosis not present

## 2015-10-24 DIAGNOSIS — R0600 Dyspnea, unspecified: Secondary | ICD-10-CM | POA: Insufficient documentation

## 2015-10-24 DIAGNOSIS — G4733 Obstructive sleep apnea (adult) (pediatric): Secondary | ICD-10-CM | POA: Diagnosis present

## 2015-10-24 DIAGNOSIS — I5043 Acute on chronic combined systolic (congestive) and diastolic (congestive) heart failure: Secondary | ICD-10-CM | POA: Diagnosis present

## 2015-10-24 DIAGNOSIS — J45909 Unspecified asthma, uncomplicated: Secondary | ICD-10-CM | POA: Diagnosis present

## 2015-10-24 DIAGNOSIS — I110 Hypertensive heart disease with heart failure: Secondary | ICD-10-CM | POA: Insufficient documentation

## 2015-10-24 DIAGNOSIS — K219 Gastro-esophageal reflux disease without esophagitis: Secondary | ICD-10-CM | POA: Diagnosis present

## 2015-10-24 DIAGNOSIS — D631 Anemia in chronic kidney disease: Secondary | ICD-10-CM | POA: Diagnosis present

## 2015-10-24 DIAGNOSIS — Z6841 Body Mass Index (BMI) 40.0 and over, adult: Secondary | ICD-10-CM

## 2015-10-24 DIAGNOSIS — I429 Cardiomyopathy, unspecified: Secondary | ICD-10-CM | POA: Diagnosis present

## 2015-10-24 DIAGNOSIS — Z7951 Long term (current) use of inhaled steroids: Secondary | ICD-10-CM

## 2015-10-24 DIAGNOSIS — Z79899 Other long term (current) drug therapy: Secondary | ICD-10-CM

## 2015-10-24 DIAGNOSIS — I509 Heart failure, unspecified: Secondary | ICD-10-CM

## 2015-10-24 DIAGNOSIS — Z87891 Personal history of nicotine dependence: Secondary | ICD-10-CM

## 2015-10-24 DIAGNOSIS — R0602 Shortness of breath: Secondary | ICD-10-CM | POA: Diagnosis not present

## 2015-10-24 DIAGNOSIS — E785 Hyperlipidemia, unspecified: Secondary | ICD-10-CM | POA: Insufficient documentation

## 2015-10-24 DIAGNOSIS — Z9114 Patient's other noncompliance with medication regimen: Secondary | ICD-10-CM

## 2015-10-24 DIAGNOSIS — Z7901 Long term (current) use of anticoagulants: Secondary | ICD-10-CM

## 2015-10-24 DIAGNOSIS — J9600 Acute respiratory failure, unspecified whether with hypoxia or hypercapnia: Secondary | ICD-10-CM | POA: Diagnosis present

## 2015-10-24 DIAGNOSIS — I4891 Unspecified atrial fibrillation: Secondary | ICD-10-CM

## 2015-10-24 DIAGNOSIS — Z9119 Patient's noncompliance with other medical treatment and regimen: Secondary | ICD-10-CM

## 2015-10-24 DIAGNOSIS — M1990 Unspecified osteoarthritis, unspecified site: Secondary | ICD-10-CM | POA: Diagnosis present

## 2015-10-24 DIAGNOSIS — N183 Chronic kidney disease, stage 3 (moderate): Secondary | ICD-10-CM | POA: Diagnosis present

## 2015-10-24 DIAGNOSIS — I5032 Chronic diastolic (congestive) heart failure: Secondary | ICD-10-CM

## 2015-10-24 DIAGNOSIS — Z9111 Patient's noncompliance with dietary regimen: Secondary | ICD-10-CM

## 2015-10-24 DIAGNOSIS — E876 Hypokalemia: Secondary | ICD-10-CM | POA: Diagnosis present

## 2015-10-24 DIAGNOSIS — I482 Chronic atrial fibrillation: Secondary | ICD-10-CM | POA: Diagnosis present

## 2015-10-24 DIAGNOSIS — N179 Acute kidney failure, unspecified: Secondary | ICD-10-CM | POA: Diagnosis present

## 2015-10-24 DIAGNOSIS — Y95 Nosocomial condition: Secondary | ICD-10-CM | POA: Diagnosis not present

## 2015-10-24 DIAGNOSIS — I472 Ventricular tachycardia: Secondary | ICD-10-CM | POA: Diagnosis not present

## 2015-10-24 DIAGNOSIS — I272 Other secondary pulmonary hypertension: Secondary | ICD-10-CM | POA: Diagnosis present

## 2015-10-24 LAB — BRAIN NATRIURETIC PEPTIDE: B Natriuretic Peptide: 640 pg/mL — ABNORMAL HIGH (ref 0.0–100.0)

## 2015-10-24 LAB — BASIC METABOLIC PANEL WITH GFR
Anion gap: 10 (ref 5–15)
BUN: 23 mg/dL — ABNORMAL HIGH (ref 6–20)
CO2: 23 mmol/L (ref 22–32)
Calcium: 8.8 mg/dL — ABNORMAL LOW (ref 8.9–10.3)
Chloride: 107 mmol/L (ref 101–111)
Creatinine, Ser: 1.24 mg/dL (ref 0.61–1.24)
GFR calc Af Amer: >60 mL/min
GFR calc non Af Amer: >60 mL/min
Glucose, Bld: 104 mg/dL — ABNORMAL HIGH (ref 65–99)
Potassium: 3.5 mmol/L (ref 3.5–5.1)
Sodium: 140 mmol/L (ref 135–145)

## 2015-10-24 LAB — CBC WITH DIFFERENTIAL/PLATELET
Basophils Absolute: 0 10*3/uL (ref 0.0–0.1)
Basophils Relative: 0 %
EOS ABS: 0.1 10*3/uL (ref 0.0–0.7)
Eosinophils Relative: 3 %
HEMATOCRIT: 35.6 % — AB (ref 39.0–52.0)
HEMOGLOBIN: 11.6 g/dL — AB (ref 13.0–17.0)
LYMPHS ABS: 1.1 10*3/uL (ref 0.7–4.0)
LYMPHS PCT: 29 %
MCH: 27.9 pg (ref 26.0–34.0)
MCHC: 32.6 g/dL (ref 30.0–36.0)
MCV: 85.6 fL (ref 78.0–100.0)
MONOS PCT: 7 %
Monocytes Absolute: 0.3 10*3/uL (ref 0.1–1.0)
NEUTROS ABS: 2.3 10*3/uL (ref 1.7–7.7)
NEUTROS PCT: 61 %
Platelets: 185 10*3/uL (ref 150–400)
RBC: 4.16 MIL/uL — AB (ref 4.22–5.81)
RDW: 14.9 % (ref 11.5–15.5)
WBC: 3.7 10*3/uL — AB (ref 4.0–10.5)

## 2015-10-24 LAB — TROPONIN I: Troponin I: 0.03 ng/mL

## 2015-10-24 MED ORDER — TORSEMIDE 20 MG PO TABS
20.0000 mg | ORAL_TABLET | Freq: Every day | ORAL | Status: DC
  Administered 2015-10-24: 20 mg via ORAL
  Filled 2015-10-24 (×2): qty 1

## 2015-10-24 NOTE — ED Provider Notes (Signed)
CSN: [id number]     Arrival date & time 10/24/15  8657 History   First MD Initiated Contact with Patient 10/24/15 [phone]     Chief Complaint  Patient presents with  . Shortness of Breath     (Consider location/radiation/quality/duration/timing/severity/associated sxs/prior Treatment) HPI    [name] is a 50 y.o. male with a history of CHF, atrial fibrillation (currently anticoagulated on Xarelto), HLD, HTN, and morbid obesity,who presents to the Emergency Department complaining of increasing shortness of breath and bilateral leg swelling.  He also reports right sided chest tightness Patient states he was admitted to the hospital for 1 week for the same symptoms about 2 weeks ago. He was seen at his cardiologist office yesterday and instructed to stop his Lasix and take Demadex 20 mg twice daily.  He admits to being noncompliant with his meds, and has not taken the [facility] as instructed.  Last took Lasix yesterday.  He denies abdominal pain, fever, chills, numbness or weakness   Past Medical History  Diagnosis Date  . Hypertensive heart disease     Hypertensive heart disease with prior episodes of CHF  . Hyperlipidemia   . Hypertension   . Gastroesophageal reflux disease   . Osteoarthritis   . Peptic ulcer disease   . Morbid obesity (HCC)     /sleep apnea  . Allergic rhinitis     plus h/o asthma  . Tobacco abuse, in remission     Cigarettes discontinued in 2010  . CHF (congestive heart failure) (HCC)   . A-fib (HCC)     2015  . Asthma   . Noncompliance   . Pulmonary hypertension (HCC) 09/19/2015  . Chronic diastolic CHF (congestive heart failure) [facility])    Past Surgical History  Procedure Laterality Date  . None    . Cardiac catheterization N/A 06/14/2015    Procedure: Right/Left Heart Cath and Coronary Angiography;  Surgeon: Runell Gess, MD;  Location: [facility] INVASIVE CV LAB;  Service: Cardiovascular;  Laterality: N/A;   Family History  Problem Relation Age of Onset  .  Colon cancer Neg Hx   . Inflammatory bowel disease Neg Hx   . Liver disease Neg Hx   . Stroke Father   . Heart attack Father    Social History  Substance Use Topics  . Smoking status: Former Smoker -- 0.50 packs/day for 20 years    Types: Cigarettes    Start date: 04/26/1988    Quit date: 06/23/2007  . Smokeless tobacco: Never Used     Comment: 1 ppd former smoker  . Alcohol Use: No     Comment: no etoh since 2009    Review of Systems  Constitutional: Negative for fever, chills and appetite change.  Respiratory: Positive for chest tightness and shortness of breath. Negative for cough and wheezing.   Cardiovascular: Positive for chest pain.  Gastrointestinal: Negative for nausea, vomiting and abdominal pain.  Genitourinary: Negative for dysuria.  Musculoskeletal:       Lower extremity swelling  Skin: Negative for rash.  Neurological: Negative for dizziness, syncope, weakness, numbness and headaches.  All other systems reviewed and are negative.     Allergies  Bee venom; Aspirin; Banana; Diltiazem; Hydralazine; and Lipitor  Home Medications   Prior to Admission medications   Medication Sig Start Date End Date Taking? Authorizing Provider  atorvastatin (LIPITOR) 80 MG tablet Take 1 tablet (80 mg total) by mouth daily at 6 PM. 09/15/15   Henderson Cloud, MD  beclomethasone (QVAR) 80 MCG/ACT inhaler Inhale 2 puffs into the lungs daily.     Historical Provider, MD  losartan (COZAAR) 100 MG tablet Take 100 mg by mouth daily.  09/16/15   Historical Provider, MD  metoprolol succinate (TOPROL-XL) 50 MG 24 hr tablet Take 1 tablet (50 mg total) by mouth 2 (two) times daily. Take with or immediately following a meal. 10/03/15   Jodelle Gross, NP  potassium chloride SA (K-DUR,KLOR-CON) 20 MEQ tablet Take 1 tablet (20 mEq total) by mouth 2 (two) times daily. Take with furosemide 09/22/15   Elliot Cousin, MD  rivaroxaban (XARELTO) 20 MG TABS tablet Take 1 tablet (20 mg total)  by mouth daily with supper. Patient taking differently: Take 20 mg by mouth daily.  09/15/15   Henderson Cloud, MD  spironolactone (ALDACTONE) 25 MG tablet Take 0.5 tablets (12.5 mg total) by mouth daily. 10/15/15   Henderson Cloud, MD  torsemide (DEMADEX) 20 MG tablet Take 2 tablets (40 mg total) by mouth 2 (two) times daily. 10/15/15   Henderson Cloud, MD   BP 151/115 mmHg  Pulse 111  Temp(Src) 98.2 F (36.8 C) (Oral)  Resp 22  Wt 139.254 kg  SpO2 97% Physical Exam  Constitutional: He is oriented to person, place, and time. He appears well-nourished. No distress.  obese  HENT:  Head: Atraumatic.  Mouth/Throat: Oropharynx is clear and moist.  Neck: Normal range of motion. Neck supple. No JVD present. No tracheal deviation present.  Cardiovascular: Intact distal pulses.  An irregularly irregular rhythm present. Tachycardia present.   Pulmonary/Chest: Effort normal. No respiratory distress. He has no wheezes. He exhibits no tenderness.  Abdominal: Soft. He exhibits no distension. There is no tenderness.  Musculoskeletal: He exhibits edema.  2+ pitting edema of bilateral LE's  Neurological: He is alert and oriented to person, place, and time. Coordination normal.  Skin: Skin is warm and dry.  Psychiatric: He has a normal mood and affect.  Nursing note and vitals reviewed.   ED Course  Procedures (including critical care time) Labs Review Labs Reviewed  CBC WITH DIFFERENTIAL/PLATELET - Abnormal; Notable for the following:    WBC 3.7 (*)    RBC 4.16 (*)    Hemoglobin 11.6 (*)    HCT 35.6 (*)    All other components within normal limits  BASIC METABOLIC PANEL - Abnormal; Notable for the following:    Glucose, Bld 104 (*)    BUN 23 (*)    Calcium 8.8 (*)    All other components within normal limits  BRAIN NATRIURETIC PEPTIDE - Abnormal; Notable for the following:    B Natriuretic Peptide 640.0 (*)    All other components within normal limits   TROPONIN I    Imaging Review Dg Chest 2 View  10/24/2015  CLINICAL DATA:  Shortness of Breath EXAM: CHEST  2 VIEW COMPARISON:  10/22/2015 FINDINGS: The cardiac shadow is enlarged but stable. The lungs are well aerated bilaterally. No focal infiltrate or sizable effusion is seen. No bony abnormality is noted. IMPRESSION: No active cardiopulmonary disease. Electronically Signed   By: Alcide Clever M.D.   On: 10/24/2015 10:09   I have personally reviewed and evaluated these images and lab results as part of my medical decision-making.   EKG Interpretation   Date/Time:  Thursday Oct 24 2015 09:31:04 EDT Ventricular Rate:  93 PR Interval:    QRS Duration: 91 QT Interval:  357 QTC Calculation: 444 R Axis:  64 Text Interpretation:  Atrial fibrillation Nonspecific T abnormalities,  lateral leads No significant change since last tracing Confirmed by  Bebe Shaggy  MD, DONALD ([id number]) on 10/24/2015 9:34:45 AM      MDM   Final diagnoses:  Dyspnea    1150  Consulted with case management.  Pt is currently followed by home health.  BNP improved from 10/22/15. Remaining labs are reassuring.  Patient is in chronic A. fib without RVR. No clinical symptoms for worsening heart failure. Repeat CXR today is w/o edema.   Patient also seen by Dr. Bebe Shaggy and care plan discussed. He appears stable for discharge at this time have counseled patient on importance of taking his medications as directed. He has a follow-up appointment with cardiology in 2 weeks.    Pauline Aus, PA-C 10/24/15 1213  [name], MD 10/24/15 1221

## 2015-10-24 NOTE — ED Notes (Addendum)
Seen here 2 days ago for shortness of breath then seen at cardiologist yesterday. Chronic hx of same. Presents via EMS with same symptoms. EMS states he ambulated independently from home to ambulance. Respirations non-labored satting 95% on room air.

## 2015-10-24 NOTE — Care Management (Signed)
Pt in ED asking for placement. Pt is not eligible unless he pays out of pocket. Pt is he HRI through [facility]. Pt discussed with Volney Presser, of [facility]. They have also discuss placement with pt and explained it is not a possibility at this time. Pt lives with his mother and chooses to be noncompliant. [facility] to follow up with pt regarding ED visit.

## 2015-10-24 NOTE — Discharge Instructions (Signed)

## 2015-10-24 NOTE — ED Notes (Signed)
Laying in bed. No distress. Denies shortness of breath or chest pain at this time.

## 2015-10-24 NOTE — ED Notes (Signed)
Ambulated approx 100 feet around nurses station. Saturation on room air 98%, HR 115, RR 24.  Pt declined wheelchair for transport to car. Stated he feels 'fine enough to walk out'. No distress at time of discharge. Speaking in full sentences.

## 2015-10-26 ENCOUNTER — Encounter (HOSPITAL_COMMUNITY): Payer: Self-pay | Admitting: *Deleted

## 2015-10-26 ENCOUNTER — Emergency Department (HOSPITAL_COMMUNITY): Payer: Medicare Other

## 2015-10-26 ENCOUNTER — Inpatient Hospital Stay (HOSPITAL_COMMUNITY)
Admission: EM | Admit: 2015-10-26 | Discharge: 2015-10-31 | DRG: 291 | Disposition: A | Discharge status: Home / Self Care | Payer: Medicare Other | Attending: Internal Medicine | Admitting: Internal Medicine

## 2015-10-26 DIAGNOSIS — G4730 Sleep apnea, unspecified: Secondary | ICD-10-CM | POA: Diagnosis present

## 2015-10-26 DIAGNOSIS — I482 Chronic atrial fibrillation, unspecified: Secondary | ICD-10-CM | POA: Diagnosis present

## 2015-10-26 DIAGNOSIS — J189 Pneumonia, unspecified organism: Secondary | ICD-10-CM | POA: Diagnosis not present

## 2015-10-26 DIAGNOSIS — J45909 Unspecified asthma, uncomplicated: Secondary | ICD-10-CM | POA: Diagnosis present

## 2015-10-26 DIAGNOSIS — N183 Chronic kidney disease, stage 3 (moderate): Secondary | ICD-10-CM

## 2015-10-26 DIAGNOSIS — I272 Pulmonary hypertension, unspecified: Secondary | ICD-10-CM | POA: Diagnosis present

## 2015-10-26 DIAGNOSIS — Z9114 Patient's other noncompliance with medication regimen: Secondary | ICD-10-CM

## 2015-10-26 DIAGNOSIS — K219 Gastro-esophageal reflux disease without esophagitis: Secondary | ICD-10-CM | POA: Diagnosis present

## 2015-10-26 DIAGNOSIS — R0602 Shortness of breath: Secondary | ICD-10-CM | POA: Diagnosis present

## 2015-10-26 DIAGNOSIS — R509 Fever, unspecified: Secondary | ICD-10-CM

## 2015-10-26 DIAGNOSIS — R0600 Dyspnea, unspecified: Secondary | ICD-10-CM

## 2015-10-26 DIAGNOSIS — I509 Heart failure, unspecified: Secondary | ICD-10-CM

## 2015-10-26 DIAGNOSIS — D649 Anemia, unspecified: Secondary | ICD-10-CM | POA: Diagnosis present

## 2015-10-26 DIAGNOSIS — N179 Acute kidney failure, unspecified: Secondary | ICD-10-CM | POA: Diagnosis not present

## 2015-10-26 DIAGNOSIS — I10 Essential (primary) hypertension: Secondary | ICD-10-CM | POA: Diagnosis present

## 2015-10-26 DIAGNOSIS — I5043 Acute on chronic combined systolic (congestive) and diastolic (congestive) heart failure: Secondary | ICD-10-CM | POA: Diagnosis not present

## 2015-10-26 DIAGNOSIS — Z91148 Patient's other noncompliance with medication regimen for other reason: Secondary | ICD-10-CM

## 2015-10-26 LAB — CBC
HCT: 37 % — ABNORMAL LOW (ref 39.0–52.0)
HEMOGLOBIN: 11.6 g/dL — AB (ref 13.0–17.0)
MCH: 27.2 pg (ref 26.0–34.0)
MCHC: 31.4 g/dL (ref 30.0–36.0)
MCV: 86.7 fL (ref 78.0–100.0)
Platelets: 206 10*3/uL (ref 150–400)
RBC: 4.27 MIL/uL (ref 4.22–5.81)
RDW: 15 % (ref 11.5–15.5)
WBC: 3.7 10*3/uL — ABNORMAL LOW (ref 4.0–10.5)

## 2015-10-26 LAB — BASIC METABOLIC PANEL
ANION GAP: 10 (ref 5–15)
BUN: 20 mg/dL (ref 6–20)
CALCIUM: 9 mg/dL (ref 8.9–10.3)
CO2: 24 mmol/L (ref 22–32)
Chloride: 108 mmol/L (ref 101–111)
Creatinine, Ser: 1.15 mg/dL (ref 0.61–1.24)
GFR calc Af Amer: >60 mL/min (ref >60)
GLUCOSE: 86 mg/dL (ref 65–99)
Potassium: 3.8 mmol/L (ref 3.5–5.1)
SODIUM: 142 mmol/L (ref 135–145)

## 2015-10-26 LAB — BRAIN NATRIURETIC PEPTIDE: B NATRIURETIC PEPTIDE 5: 659.5 pg/mL — AB (ref 0.0–100.0)

## 2015-10-26 LAB — I-STAT TROPONIN, ED: TROPONIN I, POC: 0.03 ng/mL (ref 0.00–0.08)

## 2015-10-26 MED ORDER — FUROSEMIDE 10 MG/ML IJ SOLN
40.0000 mg | Freq: Two times a day (BID) | INTRAMUSCULAR | Status: DC
  Administered 2015-10-26 – 2015-10-31 (×10): 40 mg via INTRAVENOUS
  Filled 2015-10-26 (×10): qty 4

## 2015-10-26 MED ORDER — METOPROLOL TARTRATE 5 MG/5ML IV SOLN
5.0000 mg | Freq: Once | INTRAVENOUS | Status: AC
  Administered 2015-10-26: 5 mg via INTRAVENOUS
  Filled 2015-10-26: qty 5

## 2015-10-26 MED ORDER — LOSARTAN POTASSIUM 50 MG PO TABS
100.0000 mg | ORAL_TABLET | Freq: Once | ORAL | Status: DC
  Filled 2015-10-26: qty 2

## 2015-10-26 MED ORDER — METOPROLOL SUCCINATE ER 50 MG PO TB24
50.0000 mg | ORAL_TABLET | Freq: Two times a day (BID) | ORAL | Status: DC
  Administered 2015-10-26 – 2015-10-28 (×4): 50 mg via ORAL
  Filled 2015-10-26 (×5): qty 1

## 2015-10-26 MED ORDER — SODIUM CHLORIDE 0.9 % IV SOLN: 250.0000 mL | INTRAVENOUS | Status: DC | PRN

## 2015-10-26 MED ORDER — ACETAMINOPHEN 325 MG PO TABS
650.0000 mg | ORAL_TABLET | ORAL | Status: DC | PRN
  Administered 2015-10-28 – 2015-10-29 (×2): 650 mg via ORAL
  Filled 2015-10-26: qty 2

## 2015-10-26 MED ORDER — LOSARTAN POTASSIUM 50 MG PO TABS
100.0000 mg | ORAL_TABLET | Freq: Every day | ORAL | Status: DC
  Administered 2015-10-26 – 2015-10-27 (×2): 100 mg via ORAL
  Filled 2015-10-26: qty 2

## 2015-10-26 MED ORDER — FUROSEMIDE 10 MG/ML IJ SOLN
80.0000 mg | Freq: Once | INTRAMUSCULAR | Status: AC
  Administered 2015-10-26: 80 mg via INTRAVENOUS
  Filled 2015-10-26: qty 8

## 2015-10-26 MED ORDER — SODIUM CHLORIDE 0.9% FLUSH
3.0000 mL | Freq: Two times a day (BID) | INTRAVENOUS | Status: DC
  Administered 2015-10-26 – 2015-10-31 (×9): 3 mL via INTRAVENOUS

## 2015-10-26 MED ORDER — SODIUM CHLORIDE 0.9% FLUSH: 3.0000 mL | INTRAVENOUS | Status: DC | PRN

## 2015-10-26 MED ORDER — ONDANSETRON HCL 4 MG/2ML IJ SOLN: 4.0000 mg | Freq: Four times a day (QID) | INTRAMUSCULAR | Status: DC | PRN

## 2015-10-26 MED ORDER — RIVAROXABAN 20 MG PO TABS
20.0000 mg | ORAL_TABLET | Freq: Every day | ORAL | Status: DC
  Administered 2015-10-26 – 2015-10-30 (×5): 20 mg via ORAL
  Filled 2015-10-26 (×5): qty 1

## 2015-10-26 NOTE — H&P (Signed)
History and Physical    [name]:[id number] DOB: May 28, 1966 DOA: 10/26/2015  Referring MD/NP/PA: EDP PCP: No PCP Per Patient  Outpatient Specialists: Patient Care Team: No Pcp Per Patient as PCP - General (General Practice) [name], MD as Consulting Physician (Gastroenterology)  Patient coming from:  Home   Chief Complaint: Shortness of breath  HPI: [name] is a 50 y.o. male with medical history significant for HTN, HLD, Morbid Obesity, Combined CHF with EF 20-25%, Chronic A fib on Xarelto, medical non compliance, recently discharged on 4/25 due to CHF exacerbation, now presenting with increasing shortness of breath. He was seen at the ED 2 days prior, receiving IV Lasix with some improvement of symptoms. He was discharged  with Demadex 40 mg bid but he did not take. On presentation, he was significantly dyspneic and orthopneic, reporting that could not lie down flat due to symptoms. He also reported increased abdominal girth and bilateral leg swelling. Denies fevers, chills, night sweats, vision changes, or mucositis. Denies any chest pain or palpitations.  Denies nausea, heartburn or change in bowel habits. Denies abdominal pain. Appetite is normal. Denies any dysuria but had increased diuresis due to meds. Denies abnormal skin rashes, or neuropathy. Denies any bleeding issues such as epistaxis, hematemesis, hematuria or hematochezia. Ambulating without difficulty. Cardiology to consult.    ED Course:  BP 153/106 mmHg  Pulse 73  Temp(Src) 97.7 F (36.5 C) (Oral)  Resp 24  Ht 6' (1.829 m)  Wt 138.483 kg (305 lb 4.8 oz)  BMI 41.40 kg/m2  SpO2 97% Received IV Lasix 80 mg x1 with good diuresis and improvement of symptoms. He also got Metoprolol 5 mg IVx1 for RVR with good response CXR with Cardiomegaly, pulmonary vascular redistribution and mild interstitial edema. EKG  A fib without significant changes since prior, QTC 472 BNP 659.5 (640 on 5/4 and 724 on 5/2)  Troponin  0.03  CBC unremarkable. CMET normal.   Most recent echo in the hospital 09/30/15 EF was 20-25% with diffuse hypokinesis and severe concentric LVH.   Review of Systems: As per HPI otherwise 10 point review of systems negative.   Past Medical History  Diagnosis Date  . Hypertensive heart disease     Hypertensive heart disease with prior episodes of CHF  . Hyperlipidemia   . Hypertension   . Gastroesophageal reflux disease   . Osteoarthritis   . Peptic ulcer disease   . Morbid obesity (HCC)     /sleep apnea  . Allergic rhinitis     plus h/o asthma  . Tobacco abuse, in remission     Cigarettes discontinued in 2010  . CHF (congestive heart failure) (HCC)   . A-fib (HCC)     2015  . Asthma   . Noncompliance   . Pulmonary hypertension (HCC) 09/19/2015  . Chronic diastolic CHF (congestive heart failure) [facility])     Past Surgical History  Procedure Laterality Date  . None    . Cardiac catheterization N/A 06/14/2015    Procedure: Right/Left Heart Cath and Coronary Angiography;  Surgeon: Runell Gess, MD;  Location: [facility] INVASIVE CV LAB;  Service: Cardiovascular;  Laterality: N/A;     reports that he quit smoking about 8 years ago. His smoking use included Cigarettes. He started smoking about 27 years ago. He has a 10 pack-year smoking history. He has never used smokeless tobacco. He reports that he does not drink alcohol or use illicit drugs.  Allergies  Allergen Reactions  .  Bee Venom Shortness Of Breath and Swelling  . Lipitor [Atorvastatin] Shortness Of Breath and Other (See Comments)    Nose bleed  . Aspirin Other (See Comments)    Chewable 81 mg tablets upsets patients stomach  . Banana Diarrhea  . Diltiazem Other (See Comments)    Makes heart beat fast  . Hydralazine Other (See Comments)    Patient states it causes Tachycardia    Family History  Problem Relation Age of Onset  . Colon cancer Neg Hx   . Inflammatory bowel disease Neg Hx   . Liver disease Neg Hx   .  Stroke Father   . Heart attack Father     Family history reviewed and not pertinent (If you reviewed it)  Prior to Admission medications   Medication Sig Start Date End Date Taking? Authorizing Provider  beclomethasone (QVAR) 80 MCG/ACT inhaler Inhale 2 puffs into the lungs daily.    Yes Historical Provider, MD  furosemide (LASIX) 20 MG tablet Take 20 mg by mouth daily as needed for fluid (ankle swelling).   Yes Historical Provider, MD  losartan (COZAAR) 100 MG tablet Take 100 mg by mouth daily.  09/16/15  Yes Historical Provider, MD  metoprolol succinate (TOPROL-XL) 50 MG 24 hr tablet Take 1 tablet (50 mg total) by mouth 2 (two) times daily. Take with or immediately following a meal. 10/03/15  Yes Jodelle Gross, NP  potassium chloride SA (K-DUR,KLOR-CON) 20 MEQ tablet Take 1 tablet (20 mEq total) by mouth 2 (two) times daily. Take with furosemide Patient taking differently: Take 20 mEq by mouth 2 (two) times daily.  09/22/15  Yes Elliot Cousin, MD  rivaroxaban (XARELTO) 20 MG TABS tablet Take 1 tablet (20 mg total) by mouth daily with supper. Patient taking differently: Take 20 mg by mouth daily with breakfast.  09/15/15  Yes Estela Isaiah Blakes, MD  spironolactone (ALDACTONE) 25 MG tablet Take 0.5 tablets (12.5 mg total) by mouth daily. Patient taking differently: Take 25 mg by mouth daily.  10/15/15  Yes Estela Isaiah Blakes, MD  torsemide (DEMADEX) 20 MG tablet Take 2 tablets (40 mg total) by mouth 2 (two) times daily. Patient taking differently: Take 20 mg by mouth 2 (two) times daily.  10/15/15  Yes Henderson Cloud, MD    Physical Exam:    Filed Vitals:   10/26/15 1345 10/26/15 1430 10/26/15 1445 10/26/15 1500  BP: 148/124 153/103 146/125 153/106  Pulse: 117  73   Temp:      TempSrc:      Resp: 18 22 13 24   Height:      Weight:      SpO2: 100% 100% 97%       Constitutional: NAD, calm, more comfortable since diuresis initiated Filed Vitals:   10/26/15  1345 10/26/15 1430 10/26/15 1445 10/26/15 1500  BP: 148/124 153/103 146/125 153/106  Pulse: 117  73   Temp:      TempSrc:      Resp: 18 22 13 24   Height:      Weight:      SpO2: 100% 100% 97%    Eyes: PERRL, lids and conjunctivae normal ENMT: Mucous membranes are moist. Posterior pharynx clear of any exudate or lesions. Normal dentition.  Neck: normal, supple, no masses, no thyromegaly Respiratory: clear to auscultation bilaterally, no wheezing, no crackles. Normal respiratory effort. No accessory muscle use.  Cardiovascular: Regular rate and rhythm, no murmurs / rubs / gallops. 2 + bilateral lower extremity  edema. 2+ pedal pulses. No carotid bruits. +JVD 3-4 cm Abdomen:  Obese, no tenderness, no masses palpated. No hepatosplenomegaly. Bowel sounds positive.  Musculoskeletal: no clubbing / cyanosis. No joint deformity upper and lower extremities. Good ROM, no contractures. Normal muscle tone.  Skin: no rashes, lesions, ulcers. No induration Neurologic: CN 2-12 grossly intact. Sensation intact, DTR normal. Strength 5/5 in all 4.  Psychiatric: Normal judgment and insight. Alert and oriented x 3. Normal mood.     Labs on Admission: I have personally reviewed following labs and imaging studies  CBC:  Recent Labs Lab 10/22/15 1100 10/24/15 0940 10/26/15 1135  WBC 3.5* 3.7* 3.7*  NEUTROABS 2.0 2.3  --   HGB 11.8* 11.6* 11.6*  HCT 36.6* 35.6* 37.0*  MCV 86.7 85.6 86.7  PLT 202 185 206    Basic Metabolic Panel:  Recent Labs Lab 10/22/15 1100 10/24/15 0940 10/26/15 1135  NA 140 140 142  K 3.6 3.5 3.8  CL 107 107 108  CO2 26 23 24   GLUCOSE 86 104* 86  BUN 19 23* 20  CREATININE 1.26* 1.24 1.15  CALCIUM 8.8* 8.8* 9.0    GFR: Estimated Creatinine Clearance: 114.6 mL/min (by C-G formula based on Cr of 1.15).  Cardiac Enzymes:  Recent Labs Lab 10/22/15 1100 10/24/15 0940  TROPONINI 0.03 0.03    Urine analysis:    Component Value Date/Time   COLORURINE AMBER*  10/22/2015 1108   APPEARANCEUR CLEAR 10/22/2015 1108   LABSPEC 1.025 10/22/2015 1108   PHURINE 5.5 10/22/2015 1108   GLUCOSEU NEGATIVE 10/22/2015 1108   GLUCOSEU NEG mg/dL 16/03/9603 5409   HGBUR NEGATIVE 10/22/2015 1108   BILIRUBINUR NEGATIVE 10/22/2015 1108   KETONESUR NEGATIVE 10/22/2015 1108   PROTEINUR 100* 10/22/2015 1108   UROBILINOGEN 0.2 08/08/2014 1445   NITRITE NEGATIVE 10/22/2015 1108   LEUKOCYTESUR NEGATIVE 10/22/2015 1108    Sepsis Labs: @LABRCNTIP (procalcitonin:4,lacticidven:4) )No results found for this or any previous visit (from the past 240 hour(s)).   Radiological Exams on Admission: Dg Chest 2 View  10/26/2015  CLINICAL DATA:  Patient with leg swelling and fluid retention. EXAM: CHEST  2 VIEW COMPARISON:  Chest radiograph 10/24/2015 FINDINGS: Stable cardiomegaly. Pulmonary vascular redistribution and mild interstitial pulmonary opacities bilaterally. No definite pleural effusion. Thoracic spine degenerative changes. Lateral view limited due to overlapping soft tissue. IMPRESSION: Cardiomegaly, pulmonary vascular redistribution and mild interstitial edema. Electronically Signed   By: Annia Belt M.D.   On: 10/26/2015 12:15    EKG: Independently reviewed.  Assessment/Plan Active Problems:   OBESITY, MORBID   Essential hypertension   Asthma   GERD   Sleep apnea   Noncompliance with medication regimen   Shortness of breath   Chronic atrial fibrillation (HCC)   Pulmonary hypertension (HCC)   Normocytic anemia   Acute on chronic combined systolic and diastolic heart failure (HCC)  Acute respiratory failure due to Acute Chronic diastolic heart failure, in the setting of medication non-compliance to diuretics. Most recent echo 09/30/15 EF was 20-25% with diffuse hypokinesis and severe concentric LVH.  EKG  A fib without significant changes since prior, QTC 472 BNP 659.5 (640 on 5/4 and 724 on 5/2)  Troponin 0.03 .Appears decompensated with JVD ad Peripheral edema   s/pLasix 80 mg IV x1 and bid. O sats normal in RA Admit to tele obs Careful use of IVF due to acutely decompensated heart failure. Daily weights, strict I/O Cardiology to see, await for further cardiac recommendations  O2 to maintain O sats >90%  History of Asthma. CXR with Cardiomegaly, pulmonary vascular redistribution and mild interstitial edema. Not a smoker. No wheezing on exam Continue his home inhalers as needed  Atrial Fibrillation  ChADVASC 2 Had an episode of RVR, controlled with Lopressor IV x1   Continue Xarelto  -Continue beta blocker  Other plans as per Cardiology    Hypertension BP 153/106 mmHg  Pulse 73  Temp(Src) 97.7 F (36.5 C) (Oral)  Resp 24  Ht 6' (1.829 m)  Wt 138.483 kg (305 lb 4.8 oz)  BMI 41.40 kg/m2  SpO2 97% Continue home anti-hypertensive medications including BB and [place] Patient reports not tolerant to hydralazine as it causes tachycardia  Anemia of chronic disease Hemoglobin 11.6 on admission.  No bleeding issues reported. Monitor in am    DVT prophylaxis: Xarelto  Code Status:   Full      Family Communication:  Discussed with wife   Disposition Plan: Expect patient to be discharged to home Consults called:   Cardiology, Dr. Eden Emms Admission status:  Obs Tele   Marcos Eke, PA-C [facility]   If 7PM-7AM, please contact night-coverage [email or web address] Password [facility]  10/26/2015, 3:44 PM

## 2015-10-26 NOTE — ED Provider Notes (Signed)
CSN: [id number]     Arrival date & time 10/26/15  1103 History   First MD Initiated Contact with Patient 10/26/15 1134     Chief Complaint  Patient presents with  . Leg Swelling  . Shortness of Breath  . Chest Pain     (Consider location/radiation/quality/duration/timing/severity/associated sxs/prior Treatment) HPI   Patient presents to the [facility] ED for evaluation of dyspnea and lower extremity swelling. He reports symptoms worsened in the last 4 days. He has had decreased exercise tolerance and productive cough. He went to the [facility] ED for evaluation 2 days ago and was sent home. Symptoms continued to worsen since then. He reports taking his lasix twice daily and, since seeing his cardiologist about 3 days ago, his Demadex. He reports Demadex makes him urinate more frequently but is causing his edema to worsen. He reports no chest pain, abdominal pain, nausea, vomiting or diarrhea. He does have associated palpitations and weakness. He reports 2 pillow orthopnea which has remained stable. He was recently admitted to [facility] service for HF exacerbation and found to have an EF of 20-25%. Dry weight appears to be around 280 per chart review. Per chart review, patient appears to not be very compliant with therapy or diet.  Past Medical History  Diagnosis Date  . Hypertensive heart disease     Hypertensive heart disease with prior episodes of CHF  . Hyperlipidemia   . Hypertension   . Gastroesophageal reflux disease   . Osteoarthritis   . Peptic ulcer disease   . Morbid obesity (HCC)     /sleep apnea  . Allergic rhinitis     plus h/o asthma  . Tobacco abuse, in remission     Cigarettes discontinued in 2010  . CHF (congestive heart failure) (HCC)   . A-fib (HCC)     2015  . Asthma   . Noncompliance   . Pulmonary hypertension (HCC) 09/19/2015  . Chronic diastolic CHF (congestive heart failure) [facility])    Past Surgical History  Procedure Laterality Date  . None    .  Cardiac catheterization N/A 06/14/2015    Procedure: Right/Left Heart Cath and Coronary Angiography;  Surgeon: Runell Gess, MD;  Location: [facility] INVASIVE CV LAB;  Service: Cardiovascular;  Laterality: N/A;   Family History  Problem Relation Age of Onset  . Colon cancer Neg Hx   . Inflammatory bowel disease Neg Hx   . Liver disease Neg Hx   . Stroke Father   . Heart attack Father    Social History  Substance Use Topics  . Smoking status: Former Smoker -- 0.50 packs/day for 20 years    Types: Cigarettes    Start date: 04/26/1988    Quit date: 06/23/2007  . Smokeless tobacco: Never Used     Comment: 1 ppd former smoker  . Alcohol Use: No     Comment: no etoh since 2009    Review of Systems  Constitutional: Positive for fatigue. Negative for fever.  HENT: Negative for congestion and sneezing.   Respiratory: Positive for cough and shortness of breath. Negative for choking, chest tightness and wheezing.   Cardiovascular: Positive for palpitations and leg swelling. Negative for chest pain.  Gastrointestinal: Negative for nausea, vomiting, abdominal pain and diarrhea.  Genitourinary: Positive for frequency.  Musculoskeletal: Positive for joint swelling and gait problem.  Neurological: Negative for syncope, numbness and headaches.  All other systems reviewed and are negative.     Allergies  Bee venom; Aspirin; Banana;  Diltiazem; Hydralazine; and Lipitor  Home Medications   Prior to Admission medications   Medication Sig Start Date End Date Taking? Authorizing Provider  atorvastatin (LIPITOR) 80 MG tablet Take 1 tablet (80 mg total) by mouth daily at 6 PM. 09/15/15   Henderson Cloud, MD  beclomethasone (QVAR) 80 MCG/ACT inhaler Inhale 2 puffs into the lungs daily.     Historical Provider, MD  losartan (COZAAR) 100 MG tablet Take 100 mg by mouth daily.  09/16/15   Historical Provider, MD  metoprolol succinate (TOPROL-XL) 50 MG 24 hr tablet Take 1 tablet (50 mg total) by  mouth 2 (two) times daily. Take with or immediately following a meal. 10/03/15   Jodelle Gross, NP  potassium chloride SA (K-DUR,KLOR-CON) 20 MEQ tablet Take 1 tablet (20 mEq total) by mouth 2 (two) times daily. Take with furosemide 09/22/15   Elliot Cousin, MD  rivaroxaban (XARELTO) 20 MG TABS tablet Take 1 tablet (20 mg total) by mouth daily with supper. Patient taking differently: Take 20 mg by mouth daily.  09/15/15   Henderson Cloud, MD  spironolactone (ALDACTONE) 25 MG tablet Take 0.5 tablets (12.5 mg total) by mouth daily. 10/15/15   Henderson Cloud, MD  torsemide (DEMADEX) 20 MG tablet Take 2 tablets (40 mg total) by mouth 2 (two) times daily. 10/15/15   Henderson Cloud, MD   BP 153/106 mmHg  Pulse 73  Temp(Src) 97.7 F (36.5 C) (Oral)  Resp 24  Ht 6' (1.829 m)  Wt 138.483 kg  BMI 41.40 kg/m2  SpO2 97% Physical Exam  Constitutional: He appears well-developed. No distress.  HENT:  Mouth/Throat: Oropharynx is clear and moist and mucous membranes are normal.  Neck: Normal range of motion. Neck supple. JVD present.  Cardiovascular: An irregularly irregular rhythm present. Tachycardia present.  Exam reveals distant heart sounds.   Pulmonary/Chest: Effort normal. No accessory muscle usage or stridor. No respiratory distress. He has decreased breath sounds. He has no wheezes. He has no rhonchi. He has no rales.  Abdominal: Soft. Normal appearance and bowel sounds are normal. He exhibits no abdominal bruit. There is no tenderness. There is no rebound.  Musculoskeletal:       Right lower leg: He exhibits edema. He exhibits no tenderness.       Left lower leg: He exhibits edema. He exhibits no tenderness.       Right foot: There is deformity (large subcutaneous mass on medial aspect of right ankle).  Skin: He is not diaphoretic.    ED Course  Procedures (including critical care time) Labs Review Labs Reviewed  CBC - Abnormal; Notable for the following:     WBC 3.7 (*)    Hemoglobin 11.6 (*)    HCT 37.0 (*)    All other components within normal limits  BASIC METABOLIC PANEL  BRAIN NATRIURETIC PEPTIDE  I-STAT TROPOININ, ED    Imaging Review Dg Chest 2 View  10/26/2015  CLINICAL DATA:  Patient with leg swelling and fluid retention. EXAM: CHEST  2 VIEW COMPARISON:  Chest radiograph 10/24/2015 FINDINGS: Stable cardiomegaly. Pulmonary vascular redistribution and mild interstitial pulmonary opacities bilaterally. No definite pleural effusion. Thoracic spine degenerative changes. Lateral view limited due to overlapping soft tissue. IMPRESSION: Cardiomegaly, pulmonary vascular redistribution and mild interstitial edema. Electronically Signed   By: Annia Belt M.D.   On: 10/26/2015 12:15   I have personally reviewed and evaluated these images and lab results as part of my medical decision-making.  EKG Interpretation   Date/Time:  Saturday Oct 26 2015 11:17:24 EDT Ventricular Rate:  112 PR Interval:    QRS Duration: 88 QT Interval:  346 QTC Calculation: 472 R Axis:   64 Text Interpretation:  Atrial fibrillation with rapid ventricular response  with premature ventricular or aberrantly conducted complexes Nonspecific T  wave abnormality Abnormal ECG No significant change since last tracing  Confirmed by BEATON  MD, ROBERT ([id number]) on 10/26/2015 12:09:31 PM      12:45: Lasix 80mg  IV x1 for edema and metoprolol 5mg  IV x1 for RVR  13:52: patient significantly improved with lasix. HR decreased to 100s  MDM   Final diagnoses:  Acute on chronic combined systolic and diastolic congestive heart failure (HCC)   Patient with increased acute heart failure exacerbation in the setting of atrial fibrillation with RVR. He has dyspnea on exertion, weight about 20lbs above baseline and productive cough. CXR significant for interstitial edema. BNP stable. BMP wnl and CBC stable. EKG significant for atrial fibrillation. Rate improved with IV metoprolol and  patient's symptoms improved after IV lasix. Since patient is significantly above baseline for weight with associated symptoms and atrial fibrillation with RVR, will admit. Cardiology consulted for evaluation in the ED and [facility] consulted for admission to telemetry.    Narda Bonds, MD 10/26/15 1547  Nelva Nay, MD 10/26/15 [phone]

## 2015-10-26 NOTE — Progress Notes (Signed)
Patient refused bed alarm. Will continue to monitor.  

## 2015-10-26 NOTE — Consult Note (Signed)
CARDIOLOGY CONSULT NOTE       Patient ID: GAR GLANCE MRN: [id number] DOB/AGE: 01-22-1966 50 y.o.  Admit date: 10/26/2015 Referring Physician:  Radford Pax Primary Physician: No PCP Per Patient Primary Cardiologist:   Reason for Consultation: [facility] office   Active Problems:   OBESITY, MORBID   Essential hypertension   Asthma   GERD   Sleep apnea   Noncompliance with medication regimen   Shortness of breath   Chronic atrial fibrillation (HCC)   Pulmonary hypertension (HCC)   Normocytic anemia   Acute on chronic combined systolic and diastolic heart failure (HCC)   HPI:  50 y.o. with diagnosis of chronic diastolic heart failure However last echo done  09/30/14 showed EF 20-25% with elevated EDP and pulmonary HTN  Cath done 05/2015 with no CAD.   Multiple admissions for CHF exacerbation due to medical non compliance and high sodium diet. Admits to not taking meds for at least 3 days. Increasing PND, orthopnea, PND and edema No chest pain  Has chronic afib and is supposed to be on xarelto but has not taken this either.  No syncope , palpitations or bleeding issues.    ROS All other systems reviewed and negative except as noted above  Past Medical History  Diagnosis Date  . Hypertensive heart disease     Hypertensive heart disease with prior episodes of CHF  . Hyperlipidemia   . Hypertension   . Gastroesophageal reflux disease   . Osteoarthritis   . Peptic ulcer disease   . Morbid obesity (HCC)     /sleep apnea  . Allergic rhinitis     plus h/o asthma  . Tobacco abuse, in remission     Cigarettes discontinued in 2010  . CHF (congestive heart failure) (HCC)   . A-fib (HCC)     2015  . Asthma   . Noncompliance   . Pulmonary hypertension (HCC) 09/19/2015  . Chronic diastolic CHF (congestive heart failure) (HCC)     Family History  Problem Relation Age of Onset  . Colon cancer Neg Hx   . Inflammatory bowel disease Neg Hx   . Liver disease Neg  Hx   . Stroke Father   . Heart attack Father     Social History   Social History  . Marital Status: Divorced    Spouse Name: N/A  . Number of Children: 3  . Years of Education: N/A   Occupational History  . disabled    Social History Main Topics  . Smoking status: Former Smoker -- 0.50 packs/day for 20 years    Types: Cigarettes    Start date: 04/26/1988    Quit date: 06/23/2007  . Smokeless tobacco: Never Used     Comment: 1 ppd former smoker  . Alcohol Use: No     Comment: no etoh since 2009  . Drug Use: No  . Sexual Activity: No   Other Topics Concern  . Not on file   Social History Narrative    Past Surgical History  Procedure Laterality Date  . None    . Cardiac catheterization N/A 06/14/2015    Procedure: Right/Left Heart Cath and Coronary Angiography;  Surgeon: Runell Gess, MD;  Location: [facility] INVASIVE CV LAB;  Service: Cardiovascular;  Laterality: N/A;     . losartan  100 mg Oral Daily  . rivaroxaban  20 mg Oral Q supper  . sodium chloride flush  3 mL Intravenous Q12H   . sodium chloride  Physical Exam: Blood pressure 153/106, pulse 73, temperature 97.7 F (36.5 C), temperature source Oral, resp. rate 24, height 6' (1.829 m), weight 138.483 kg (305 lb 4.8 oz), SpO2 97 %.    Affect appropriate Chronically ill black male  HEENT: sensitive to light with sunglasses on  Neck supple with no adenopathy JVP elevated no bruits no thyromegaly Lungs clear with no wheezing and good diaphragmatic motion Heart:  S1/S2 no murmur, no rub, gallop or click PMI normal Abdomen: benighn, BS positve, no tenderness, no AAA no bruit.  No HSM or HJR Distal pulses intact with no bruits Plus 3 bilateral LE  edema Neuro non-focal Skin warm and dry No muscular weakness   Labs:   Lab Results  Component Value Date   WBC 3.7* 10/26/2015   HGB 11.6* 10/26/2015   HCT 37.0* 10/26/2015   MCV 86.7 10/26/2015   PLT 206 10/26/2015     Recent Labs Lab  10/26/15 1135  NA 142  K 3.8  CL 108  CO2 24  BUN 20  CREATININE 1.15  CALCIUM 9.0  GLUCOSE 86   Lab Results  Component Value Date   CKTOTAL 378* 12/07/2007   CKMB 7.0* 12/07/2007   TROPONINI 0.03 10/24/2015    Lab Results  Component Value Date   CHOL 139 06/14/2015   CHOL 154 04/02/2014   CHOL 167 01/05/2011   Lab Results  Component Value Date   HDL 28* 06/14/2015   HDL 44 04/02/2014   HDL 45 01/05/2011   Lab Results  Component Value Date   LDLCALC 99 06/14/2015   LDLCALC 98 04/02/2014   LDLCALC 108* 01/05/2011   Lab Results  Component Value Date   TRIG 58 06/14/2015   TRIG 61 04/02/2014   TRIG 70 01/05/2011   Lab Results  Component Value Date   CHOLHDL 5.0 06/14/2015   CHOLHDL 3.5 04/02/2014   CHOLHDL 3.7 01/05/2011   No results found for: LDLDIRECT    Radiology: Dg Chest 2 View  10/26/2015  CLINICAL DATA:  Patient with leg swelling and fluid retention. EXAM: CHEST  2 VIEW COMPARISON:  Chest radiograph 10/24/2015 FINDINGS: Stable cardiomegaly. Pulmonary vascular redistribution and mild interstitial pulmonary opacities bilaterally. No definite pleural effusion. Thoracic spine degenerative changes. Lateral view limited due to overlapping soft tissue. IMPRESSION: Cardiomegaly, pulmonary vascular redistribution and mild interstitial edema. Electronically Signed   By: Annia Belt M.D.   On: 10/26/2015 12:15   Dg Chest 2 View  10/24/2015  CLINICAL DATA:  Shortness of Breath EXAM: CHEST  2 VIEW COMPARISON:  10/22/2015 FINDINGS: The cardiac shadow is enlarged but stable. The lungs are well aerated bilaterally. No focal infiltrate or sizable effusion is seen. No bony abnormality is noted. IMPRESSION: No active cardiopulmonary disease. Electronically Signed   By: Alcide Clever M.D.   On: 10/24/2015 10:09   Dg Chest 2 View  10/07/2015  CLINICAL DATA:  Congestive heart failure. Shortness of breath and leg swelling. EXAM: CHEST  2 VIEW COMPARISON:  09/28/2015 FINDINGS: The  heart is enlarged. Mediastinal shadows are unremarkable. There is a small amount of fluid in the fissures but no frank edema or measurable effusion. The lungs are well aerated. No significant bone finding. IMPRESSION: Small amount of fluid in the fissures. No chest radiographic evidence of pronounced CHF. Electronically Signed   By: Paulina Fusi M.D.   On: 10/07/2015 18:19   Dg Chest 2 View  09/28/2015  CLINICAL DATA:  50 year old with current history of CHF, presenting with recurrent  shortness of breath, chest pain, and bilateral lower extremity edema. Recent multiple hospitalizations for control of fluid status. EXAM: CHEST  2 VIEW COMPARISON:  09/26/2015 and earlier, including CTA chest 09/19/2015. FINDINGS: Cardiac silhouette markedly enlarged, unchanged. Pulmonary venous hypertension, improved since examination 2 days ago, without overt edema currently. Mild atelectasis at the right lung base, unchanged. No new pulmonary parenchymal abnormalities. IMPRESSION: 1. Pulmonary venous hypertension, improved since examination 2 days ago, without overt edema currently. 2. Stable mild right basilar atelectasis. No acute cardiopulmonary disease otherwise. Electronically Signed   By: Hulan Saas M.D.   On: 09/28/2015 11:59   Dg Ankle 2 Views Right  10/07/2015  CLINICAL DATA:  Pain and swelling, chronic EXAM: RIGHT ANKLE - 2 VIEW COMPARISON:  Right tibia and fibula March 26, 2011 FINDINGS: Frontal lateral views were obtained. There is marked soft tissue swelling in the ankle region, most severely medially and posteriorly, stable from 2012. No fracture or joint effusion. Ankle mortise appears intact. No appreciable joint space narrowing. There is a spur arising from the posterior calcaneus. IMPRESSION: Marked soft tissue swelling, most severe medially and posteriorly, unchanged from 2012. No fracture or appreciable joint space narrowing. Ankle mortise appears intact. Stable posterior calcaneal spur.  Electronically Signed   By: Bretta Bang III M.D.   On: 10/07/2015 20:39   Dg Chest Portable 1 View  10/22/2015  CLINICAL DATA:  Two day history of shortness of breath EXAM: PORTABLE CHEST 1 VIEW COMPARISON:  October 07, 2015 FINDINGS: There is no edema or consolidation. There is generalized cardiac enlargement with pulmonary vascularity within normal limits. No adenopathy. No bone lesions. IMPRESSION: Stable generalized cardiomegaly.  No edema or consolidation. Electronically Signed   By: Bretta Bang III M.D.   On: 10/22/2015 10:57    EKG: Chronic afib rate 112 LVH no acute changes    ASSESSMENT AND PLAN:   CHF:  Nonischemic DCM Worse by poor diet and non compliance with meds Admit iv diuresis Not a candidate for advanced Rx or BiV AICD due to repeated non compliance Tried to explain to patient that demedex has better bioavailability and is stronger but he thinks it makes him swell more  Afib: Discussed risk of stroke when not taking xarelto  Rate control is fine   This patients CHA2DS2-VASc Score and unadjusted Ischemic Stroke Rate (% per year) is equal to 2.2 % stroke rate/year from a score of 2  Above score calculated as 1 point each if present [CHF, HTN, DM, Vascular=MI/PAD/Aortic Plaque, Age if 65-74, or Male] Above score calculated as 2 points each if present [Age > 75, or Stroke/TIA/TE]   Signed: Charlton Haws 10/26/2015, 4:06 PM

## 2015-10-26 NOTE — ED Notes (Signed)
Pt reports leg swelling and fluid retention for two days. Having sob and chest pains. Hx of chf. Airway intact at triage.

## 2015-10-27 DIAGNOSIS — I272 Other secondary pulmonary hypertension: Secondary | ICD-10-CM | POA: Diagnosis present

## 2015-10-27 DIAGNOSIS — Z9114 Patient's other noncompliance with medication regimen: Secondary | ICD-10-CM | POA: Diagnosis not present

## 2015-10-27 DIAGNOSIS — N183 Chronic kidney disease, stage 3 (moderate): Secondary | ICD-10-CM | POA: Diagnosis present

## 2015-10-27 DIAGNOSIS — Z87891 Personal history of nicotine dependence: Secondary | ICD-10-CM | POA: Diagnosis not present

## 2015-10-27 DIAGNOSIS — E876 Hypokalemia: Secondary | ICD-10-CM | POA: Diagnosis present

## 2015-10-27 DIAGNOSIS — Y95 Nosocomial condition: Secondary | ICD-10-CM | POA: Diagnosis not present

## 2015-10-27 DIAGNOSIS — I130 Hypertensive heart and chronic kidney disease with heart failure and stage 1 through stage 4 chronic kidney disease, or unspecified chronic kidney disease: Secondary | ICD-10-CM | POA: Diagnosis present

## 2015-10-27 DIAGNOSIS — E785 Hyperlipidemia, unspecified: Secondary | ICD-10-CM | POA: Diagnosis present

## 2015-10-27 DIAGNOSIS — R0602 Shortness of breath: Secondary | ICD-10-CM | POA: Diagnosis present

## 2015-10-27 DIAGNOSIS — K219 Gastro-esophageal reflux disease without esophagitis: Secondary | ICD-10-CM | POA: Diagnosis present

## 2015-10-27 DIAGNOSIS — I482 Chronic atrial fibrillation: Secondary | ICD-10-CM | POA: Diagnosis not present

## 2015-10-27 DIAGNOSIS — J9600 Acute respiratory failure, unspecified whether with hypoxia or hypercapnia: Secondary | ICD-10-CM | POA: Diagnosis present

## 2015-10-27 DIAGNOSIS — D631 Anemia in chronic kidney disease: Secondary | ICD-10-CM | POA: Diagnosis present

## 2015-10-27 DIAGNOSIS — Z9119 Patient's noncompliance with other medical treatment and regimen: Secondary | ICD-10-CM | POA: Diagnosis not present

## 2015-10-27 DIAGNOSIS — M1990 Unspecified osteoarthritis, unspecified site: Secondary | ICD-10-CM | POA: Diagnosis present

## 2015-10-27 DIAGNOSIS — I5043 Acute on chronic combined systolic (congestive) and diastolic (congestive) heart failure: Secondary | ICD-10-CM | POA: Diagnosis not present

## 2015-10-27 DIAGNOSIS — I10 Essential (primary) hypertension: Secondary | ICD-10-CM

## 2015-10-27 DIAGNOSIS — I472 Ventricular tachycardia: Secondary | ICD-10-CM | POA: Diagnosis not present

## 2015-10-27 DIAGNOSIS — Z7901 Long term (current) use of anticoagulants: Secondary | ICD-10-CM | POA: Diagnosis not present

## 2015-10-27 DIAGNOSIS — J45909 Unspecified asthma, uncomplicated: Secondary | ICD-10-CM | POA: Diagnosis present

## 2015-10-27 DIAGNOSIS — Z6841 Body Mass Index (BMI) 40.0 and over, adult: Secondary | ICD-10-CM | POA: Diagnosis not present

## 2015-10-27 DIAGNOSIS — N179 Acute kidney failure, unspecified: Secondary | ICD-10-CM | POA: Diagnosis present

## 2015-10-27 DIAGNOSIS — I429 Cardiomyopathy, unspecified: Secondary | ICD-10-CM | POA: Diagnosis present

## 2015-10-27 DIAGNOSIS — Z9111 Patient's noncompliance with dietary regimen: Secondary | ICD-10-CM | POA: Diagnosis not present

## 2015-10-27 DIAGNOSIS — G4733 Obstructive sleep apnea (adult) (pediatric): Secondary | ICD-10-CM | POA: Diagnosis present

## 2015-10-27 DIAGNOSIS — J189 Pneumonia, unspecified organism: Secondary | ICD-10-CM | POA: Diagnosis not present

## 2015-10-27 LAB — BASIC METABOLIC PANEL
ANION GAP: 10 (ref 5–15)
BUN: 19 mg/dL (ref 6–20)
CALCIUM: 8.6 mg/dL — AB (ref 8.9–10.3)
CO2: 27 mmol/L (ref 22–32)
Chloride: 106 mmol/L (ref 101–111)
Creatinine, Ser: 1.19 mg/dL (ref 0.61–1.24)
GLUCOSE: 90 mg/dL (ref 65–99)
Potassium: 3.3 mmol/L — ABNORMAL LOW (ref 3.5–5.1)
Sodium: 143 mmol/L (ref 135–145)

## 2015-10-27 MED ORDER — AMLODIPINE BESYLATE 2.5 MG PO TABS
2.5000 mg | ORAL_TABLET | Freq: Every day | ORAL | Status: DC
  Administered 2015-10-27 – 2015-10-31 (×5): 2.5 mg via ORAL
  Filled 2015-10-27 (×5): qty 1

## 2015-10-27 MED ORDER — POTASSIUM CHLORIDE CRYS ER 20 MEQ PO TBCR
20.0000 meq | EXTENDED_RELEASE_TABLET | Freq: Once | ORAL | Status: AC
  Administered 2015-10-27: 20 meq via ORAL
  Filled 2015-10-27: qty 1

## 2015-10-27 NOTE — Progress Notes (Signed)
Patient ID: [name], male   DOB: 1965-07-07, 50 y.o.   MRN: [id number]    Subjective:  Denies SSCP, palpitations or Dyspnea  Good diuresis    Objective:  Filed Vitals:   10/26/15 1822 10/26/15 2120 10/27/15 0101 10/27/15 0343  BP: 148/101 140/99 145/111 141/99  Pulse: 101 104 65 76  Temp: 97.8 F (36.6 C) 97.4 F (36.3 C) 98.4 F (36.9 C) 98.7 F (37.1 C)  TempSrc: Oral Oral Oral Oral  Resp: 20 18 16 22   Height: 6' (1.829 m)     Weight: 135.9 kg (299 lb 9.7 oz)   133.72 kg (294 lb 12.8 oz)  SpO2: 100% 94% 99% 95%    Intake/Output from previous day:  Intake/Output Summary (Last 24 hours) at 10/27/15 0806 Last data filed at 10/26/15 2356  Gross per 24 hour  Intake    462 ml  Output   3775 ml  Net  -3313 ml    Physical Exam: Affect appropriate Chronically ill black male  HEENT: normal still wearing sunglasses  Neck supple with no adenopathy JVP normal no bruits no thyromegaly Lungs clear with no wheezing and good diaphragmatic motion Heart:  S1/S2 no murmur, no rub, gallop or click PMI normal Abdomen: benighn, BS positve, no tenderness, no AAA no bruit.  No HSM or HJR Distal pulses intact with no bruits Plus 2 bilateral edema Neuro non-focal Skin warm and dry No muscular weakness   Lab Results: Basic Metabolic Panel:  Recent Labs  71/69/67 1135 10/27/15 0220  NA 142 143  K 3.8 3.3*  CL 108 106  CO2 24 27  GLUCOSE 86 90  BUN 20 19  CREATININE 1.15 1.19  CALCIUM 9.0 8.6*   CBC:  Recent Labs  10/24/15 0940 10/26/15 1135  WBC 3.7* 3.7*  NEUTROABS 2.3  --   HGB 11.6* 11.6*  HCT 35.6* 37.0*  MCV 85.6 86.7  PLT 185 206   Cardiac Enzymes:  Recent Labs  10/24/15 0940  TROPONINI 0.03    Imaging: Dg Chest 2 View  10/26/2015  CLINICAL DATA:  Patient with leg swelling and fluid retention. EXAM: CHEST  2 VIEW COMPARISON:  Chest radiograph 10/24/2015 FINDINGS: Stable cardiomegaly. Pulmonary vascular redistribution and mild interstitial  pulmonary opacities bilaterally. No definite pleural effusion. Thoracic spine degenerative changes. Lateral view limited due to overlapping soft tissue. IMPRESSION: Cardiomegaly, pulmonary vascular redistribution and mild interstitial edema. Electronically Signed   By: Annia Belt M.D.   On: 10/26/2015 12:15    Cardiac Studies:  ECG:  AFib LVH no acute changes    Telemetry: afib rates 90-100   Echo:  09/30/15 reviewed  -   Compared to a prior study in 05/2015, the LVEF has declined to 20-25% with global hypokinesis. A-fib is noted to be present.  Medications:   . furosemide  40 mg Intravenous BID  . losartan  100 mg Oral Daily  . metoprolol succinate  50 mg Oral BID  . rivaroxaban  20 mg Oral Q supper  . sodium chloride flush  3 mL Intravenous Q12H       Assessment/Plan:  CHF:  Improved discussed medical noncompliance again  Continue iv diuresis for 48 more hrs AFib:  Rate control with beta blocker on xarelto discussed risk of stroke when he does not take it  Charlton Haws 10/27/2015, 8:06 AM

## 2015-10-27 NOTE — Progress Notes (Signed)
Patient ID: [name], male   DOB: 10-24-1965, 50 y.o.   MRN: [id number]                                                                PROGRESS NOTE                                                                                                                                                                                                             Patient Demographics:    [name], is a 50 y.o. male, DOB - [birth year], WUJ:[id number]  Admit date - 10/26/2015   Admitting Physician Pete Glatter, MD  Outpatient Primary MD for the patient is No PCP Per Patient  LOS - 1d  Outpatient Specialists: [facility]  Chief Complaint  Patient presents with  . Leg Swelling  . Shortness of Breath  . Chest Pain       Brief Narrative  50 yo male with CHF (ef 20-25%), morbid obeisity, chronic afib, apparently c/o increase dyspnea, admitted for CHF exacerbation.    Subjective:    Derryck Shahan feels better this am.  Breathing almost at baseline.  Denies cp, palp, orthopnea, pnd.  Lower ext edema. Has improved.      Assessment  & Plan :    Active Problems:   OBESITY, MORBID   Essential hypertension   Asthma   GERD   Sleep apnea   Noncompliance with medication regimen   Shortness of breath   Chronic atrial fibrillation (HCC)   Pulmonary hypertension (HCC)   Normocytic anemia   Acute on chronic combined systolic and diastolic heart failure (HCC)   Acute on chronic combined systolic (congestive) and diastolic (congestive) heart failure (HCC)   1.  CHF (acute), combined systolic and diastolic.  Cont iv lasix , appreciate cardiology input.  Consider entresto.    2.  Pafib Cont xarelto  3. Hypertension Cont losartan, cont metoprolol Add amlodipine 2.5mg  po qday  4.  Hypokalemia Replete Check cmp in am  5. Anemia Check cbc in am      Code Status : FULL CODE Consults  :  CARDIOLOGY  DVT Prophylaxis  :  ON XARELTO   Lab Results  Component Value Date   PLT 206  10/26/2015       Objective:  Filed Vitals:   10/26/15 1822 10/26/15 2120 10/27/15 0101 10/27/15 0343  BP: 148/101 140/99 145/111 141/99  Pulse: 101 104 65 76  Temp: 97.8 F (36.6 C) 97.4 F (36.3 C) 98.4 F (36.9 C) 98.7 F (37.1 C)  TempSrc: Oral Oral Oral Oral  Resp: 20 18 16 22   Height: 6' (1.829 m)     Weight: 135.9 kg (299 lb 9.7 oz)   133.72 kg (294 lb 12.8 oz)  SpO2: 100% 94% 99% 95%    Wt Readings from Last 3 Encounters:  10/27/15 133.72 kg (294 lb 12.8 oz)  10/24/15 139.254 kg (307 lb)  10/23/15 136.533 kg (301 lb)     Intake/Output Summary (Last 24 hours) at 10/27/15 0936 Last data filed at 10/27/15 0981  Gross per 24 hour  Intake    642 ml  Output   3775 ml  Net  -3133 ml     Physical Exam  Awake Alert, Oriented X 3, No new F.N deficits, Normal affect Heent: anicteric Neck:  Supple No JVD, No cervical lymphadenopathy appriciated.  Symmetrical Chest wall movement, Good air movement bilaterally, CTAB RRR,No Gallops,Rubs or new Murmurs, No Parasternal Heave +ve B.Sounds, Abd Soft, No tenderness, No organomegaly appriciated, No rebound - guarding or rigidity. No Cyanosis, Clubbing , trace edema    Data Review:    CBC  Recent Labs Lab 10/22/15 1100 10/24/15 0940 10/26/15 1135  WBC 3.5* 3.7* 3.7*  HGB 11.8* 11.6* 11.6*  HCT 36.6* 35.6* 37.0*  PLT 202 185 206  MCV 86.7 85.6 86.7  MCH 28.0 27.9 27.2  MCHC 32.2 32.6 31.4  RDW 14.7 14.9 15.0  LYMPHSABS 1.2 1.1  --   MONOABS 0.3 0.3  --   EOSABS 0.1 0.1  --   BASOSABS 0.0 0.0  --     Chemistries   Recent Labs Lab 10/22/15 1100 10/24/15 0940 10/26/15 1135 10/27/15 0220  NA 140 140 142 143  K 3.6 3.5 3.8 3.3*  CL 107 107 108 106  CO2 26 23 24 27   GLUCOSE 86 104* 86 90  BUN 19 23* 20 19  CREATININE 1.26* 1.24 1.15 1.19  CALCIUM 8.8* 8.8* 9.0 8.6*   ------------------------------------------------------------------------------------------------------------------ No results for  input(s): CHOL, HDL, LDLCALC, TRIG, CHOLHDL, LDLDIRECT in the last 72 hours.  Lab Results  Component Value Date   HGBA1C 6.2* 09/29/2015   ------------------------------------------------------------------------------------------------------------------ No results for input(s): TSH, T4TOTAL, T3FREE, THYROIDAB in the last 72 hours.  Invalid input(s): FREET3 ------------------------------------------------------------------------------------------------------------------ No results for input(s): VITAMINB12, FOLATE, FERRITIN, TIBC, IRON, RETICCTPCT in the last 72 hours.  Coagulation profile No results for input(s): INR, PROTIME in the last 168 hours.  No results for input(s): DDIMER in the last 72 hours.  Cardiac Enzymes  Recent Labs Lab 10/22/15 1100 10/24/15 0940  TROPONINI 0.03 0.03   ------------------------------------------------------------------------------------------------------------------    Component Value Date/Time   BNP 659.5* 10/26/2015 1135    Inpatient Medications  Scheduled Meds: . furosemide  40 mg Intravenous BID  . losartan  100 mg Oral Daily  . metoprolol succinate  50 mg Oral BID  . potassium chloride  20 mEq Oral Once  . rivaroxaban  20 mg Oral Q supper  . sodium chloride flush  3 mL Intravenous Q12H   Continuous Infusions:  PRN Meds:.sodium chloride, acetaminophen, ondansetron (ZOFRAN) IV, sodium chloride flush  Micro Results No results found for this or any previous visit (from the past 240 hour(s)).  Radiology Reports Dg Chest 2 View  10/26/2015  CLINICAL DATA:  Patient with leg swelling and fluid retention. EXAM: CHEST  2 VIEW COMPARISON:  Chest radiograph 10/24/2015 FINDINGS: Stable cardiomegaly. Pulmonary vascular redistribution and mild interstitial pulmonary opacities bilaterally. No definite pleural effusion. Thoracic spine degenerative changes. Lateral view limited due to overlapping soft tissue. IMPRESSION: Cardiomegaly, pulmonary  vascular redistribution and mild interstitial edema. Electronically Signed   By: Annia Belt M.D.   On: 10/26/2015 12:15   Dg Chest 2 View  10/24/2015  CLINICAL DATA:  Shortness of Breath EXAM: CHEST  2 VIEW COMPARISON:  10/22/2015 FINDINGS: The cardiac shadow is enlarged but stable. The lungs are well aerated bilaterally. No focal infiltrate or sizable effusion is seen. No bony abnormality is noted. IMPRESSION: No active cardiopulmonary disease. Electronically Signed   By: Alcide Clever M.D.   On: 10/24/2015 10:09   Dg Chest 2 View  10/07/2015  CLINICAL DATA:  Congestive heart failure. Shortness of breath and leg swelling. EXAM: CHEST  2 VIEW COMPARISON:  09/28/2015 FINDINGS: The heart is enlarged. Mediastinal shadows are unremarkable. There is a small amount of fluid in the fissures but no frank edema or measurable effusion. The lungs are well aerated. No significant bone finding. IMPRESSION: Small amount of fluid in the fissures. No chest radiographic evidence of pronounced CHF. Electronically Signed   By: Paulina Fusi M.D.   On: 10/07/2015 18:19   Dg Chest 2 View  09/28/2015  CLINICAL DATA:  50 year old with current history of CHF, presenting with recurrent shortness of breath, chest pain, and bilateral lower extremity edema. Recent multiple hospitalizations for control of fluid status. EXAM: CHEST  2 VIEW COMPARISON:  09/26/2015 and earlier, including CTA chest 09/19/2015. FINDINGS: Cardiac silhouette markedly enlarged, unchanged. Pulmonary venous hypertension, improved since examination 2 days ago, without overt edema currently. Mild atelectasis at the right lung base, unchanged. No new pulmonary parenchymal abnormalities. IMPRESSION: 1. Pulmonary venous hypertension, improved since examination 2 days ago, without overt edema currently. 2. Stable mild right basilar atelectasis. No acute cardiopulmonary disease otherwise. Electronically Signed   By: Hulan Saas M.D.   On: 09/28/2015 11:59   Dg  Ankle 2 Views Right  10/07/2015  CLINICAL DATA:  Pain and swelling, chronic EXAM: RIGHT ANKLE - 2 VIEW COMPARISON:  Right tibia and fibula March 26, 2011 FINDINGS: Frontal lateral views were obtained. There is marked soft tissue swelling in the ankle region, most severely medially and posteriorly, stable from 2012. No fracture or joint effusion. Ankle mortise appears intact. No appreciable joint space narrowing. There is a spur arising from the posterior calcaneus. IMPRESSION: Marked soft tissue swelling, most severe medially and posteriorly, unchanged from 2012. No fracture or appreciable joint space narrowing. Ankle mortise appears intact. Stable posterior calcaneal spur. Electronically Signed   By: Bretta Bang III M.D.   On: 10/07/2015 20:39   Dg Chest Portable 1 View  10/22/2015  CLINICAL DATA:  Two day history of shortness of breath EXAM: PORTABLE CHEST 1 VIEW COMPARISON:  October 07, 2015 FINDINGS: There is no edema or consolidation. There is generalized cardiac enlargement with pulmonary vascularity within normal limits. No adenopathy. No bone lesions. IMPRESSION: Stable generalized cardiomegaly.  No edema or consolidation. Electronically Signed   By: Bretta Bang III M.D.   On: 10/22/2015 10:57    Time Spent in minutes  30   Pearson Grippe M.D on 10/27/2015 at 9:36 AM  Between 7am to 7pm - Pager - [phone] After 7pm go to [email or web address] - password [facility]  [facility] -  Office  [phone]

## 2015-10-28 DIAGNOSIS — N183 Chronic kidney disease, stage 3 unspecified: Secondary | ICD-10-CM | POA: Diagnosis not present

## 2015-10-28 DIAGNOSIS — I482 Chronic atrial fibrillation: Secondary | ICD-10-CM

## 2015-10-28 DIAGNOSIS — Z9114 Patient's other noncompliance with medication regimen: Secondary | ICD-10-CM

## 2015-10-28 DIAGNOSIS — I272 Other secondary pulmonary hypertension: Secondary | ICD-10-CM

## 2015-10-28 DIAGNOSIS — N179 Acute kidney failure, unspecified: Secondary | ICD-10-CM

## 2015-10-28 LAB — COMPREHENSIVE METABOLIC PANEL
ALBUMIN: 3.5 g/dL (ref 3.5–5.0)
ALK PHOS: 61 U/L (ref 38–126)
ALT: 20 U/L (ref 17–63)
ANION GAP: 14 (ref 5–15)
AST: 25 U/L (ref 15–41)
BUN: 20 mg/dL (ref 6–20)
CALCIUM: 8.9 mg/dL (ref 8.9–10.3)
CHLORIDE: 105 mmol/L (ref 101–111)
CO2: 23 mmol/L (ref 22–32)
Creatinine, Ser: 1.41 mg/dL — ABNORMAL HIGH (ref 0.61–1.24)
GFR calc non Af Amer: 58 mL/min — ABNORMAL LOW (ref >60)
GLUCOSE: 83 mg/dL (ref 65–99)
Potassium: 3.5 mmol/L (ref 3.5–5.1)
SODIUM: 142 mmol/L (ref 135–145)
Total Bilirubin: 2.5 mg/dL — ABNORMAL HIGH (ref 0.3–1.2)
Total Protein: 6.3 g/dL — ABNORMAL LOW (ref 6.5–8.1)

## 2015-10-28 LAB — CBC
HCT: 38.8 % — ABNORMAL LOW (ref 39.0–52.0)
HEMOGLOBIN: 12.1 g/dL — AB (ref 13.0–17.0)
MCH: 27.1 pg (ref 26.0–34.0)
MCHC: 31.2 g/dL (ref 30.0–36.0)
MCV: 87 fL (ref 78.0–100.0)
PLATELETS: 224 10*3/uL (ref 150–400)
RBC: 4.46 MIL/uL (ref 4.22–5.81)
RDW: 15 % (ref 11.5–15.5)
WBC: 3.8 10*3/uL — ABNORMAL LOW (ref 4.0–10.5)

## 2015-10-28 MED ORDER — METOPROLOL TARTRATE 50 MG PO TABS
50.0000 mg | ORAL_TABLET | Freq: Four times a day (QID) | ORAL | Status: DC | PRN
  Administered 2015-10-29 – 2015-10-30 (×3): 50 mg via ORAL
  Filled 2015-10-28 (×3): qty 1

## 2015-10-28 MED ORDER — LOSARTAN POTASSIUM 50 MG PO TABS: 100.0000 mg | ORAL_TABLET | Freq: Every day | ORAL | Status: DC

## 2015-10-28 MED ORDER — SPIRONOLACTONE 25 MG PO TABS
12.5000 mg | ORAL_TABLET | Freq: Every day | ORAL | Status: DC
  Administered 2015-10-28 – 2015-10-31 (×3): 12.5 mg via ORAL
  Filled 2015-10-28 (×4): qty 1

## 2015-10-28 MED ORDER — METOPROLOL SUCCINATE ER 50 MG PO TB24
75.0000 mg | ORAL_TABLET | Freq: Two times a day (BID) | ORAL | Status: DC
  Administered 2015-10-28 – 2015-10-31 (×6): 75 mg via ORAL
  Filled 2015-10-28 (×10): qty 1

## 2015-10-28 NOTE — Progress Notes (Signed)
   10/28/15 0520  Vitals  Temp (!) 101.5 F (38.6 C)  Temp Source Oral  BP (!) 129/94 mmHg  BP Location Left Arm  BP Method Automatic  Patient Position (if appropriate) Lying  Pulse Rate (!) 107  Pulse Rate Source Dinamap  Resp 20  Oxygen Therapy  SpO2 92 %  O2 Device Room Air  Height and Weight  Weight 132.45 kg (292 lb) (scale a)  Type of Scale Used Standing  Type of Weight Actual  notified L. Harduk PA for pt's temp and HR in the 140's nonsustained. 650mg  po tylenol given. Will continue to monitor.

## 2015-10-28 NOTE — Progress Notes (Signed)
Hospital Problem List     Active Problems:   OBESITY, MORBID   Essential hypertension   Asthma   GERD   Sleep apnea   Noncompliance with medication regimen   Shortness of breath   Chronic atrial fibrillation (HCC)   Pulmonary hypertension (HCC)   Normocytic anemia   Acute on chronic combined systolic and diastolic heart failure [facility])    Patient Profile:   Primary Cardiologist: Dr. Purvis Sheffield  50 yo male w/ PMH of chronic combined systolic and diastolic CHF (EF 78-47%), nonischemic cardiomyopathy, pulm HTN, chronic atrial fibrillation and medical noncompliance who presented to [facility] on 10/26/2015 for worsening dyspnea and lower extremity edema.   Subjective   Reports improvement in his lower extremity edema. Denies any chest pain or palpitations.   Inpatient Medications    . amLODipine  2.5 mg Oral Daily  . furosemide  40 mg Intravenous BID  . [START ON 10/29/2015] losartan  100 mg Oral Daily  . metoprolol succinate  50 mg Oral BID  . rivaroxaban  20 mg Oral Q supper  . sodium chloride flush  3 mL Intravenous Q12H    Vital Signs    Filed Vitals:   10/27/15 1214 10/27/15 1712 10/27/15 2002 10/28/15 0520  BP: 115/77 127/104 125/100 129/94  Pulse: 79 103 82 107  Temp: 98.8 F (37.1 C)  98.4 F (36.9 C) 101.5 F (38.6 C)  TempSrc: Oral  Oral Oral  Resp: 20  20 20   Height:      Weight:    292 lb (132.45 kg)  SpO2: 95%  99% 92%    Intake/Output Summary (Last 24 hours) at 10/28/15 0850 Last data filed at 10/28/15 0525  Gross per 24 hour  Intake    960 ml  Output   2225 ml  Net  -1265 ml   Filed Weights   10/26/15 1822 10/27/15 0343 10/28/15 0520  Weight: 299 lb 9.7 oz (135.9 kg) 294 lb 12.8 oz (133.72 kg) 292 lb (132.45 kg)    Physical Exam    General: Obese African American male appearing in no acute distress. Head: Normocephalic, atraumatic.  Neck: Supple without bruits, JVD elevated to 9cm. Lungs:  Resp regular and unlabored, CTA without  wheezing or rales. Heart: Irregularly irregular, S1, S2, no S3, S4, or murmur; no rub. Abdomen: Soft, non-tender, non-distended with normoactive bowel sounds. No hepatomegaly. No rebound/guarding. No obvious abdominal masses. Extremities: No clubbing or cyanosis, 1+ pre-tibial edema bilaterally. Distal pedal pulses are 2+ bilaterally. Neuro: Alert and oriented X 3. Moves all extremities spontaneously. Psych: Normal affect.  Labs    CBC  Recent Labs  10/26/15 1135 10/28/15 0228  WBC 3.7* 3.8*  HGB 11.6* 12.1*  HCT 37.0* 38.8*  MCV 86.7 87.0  PLT 206 224   Basic Metabolic Panel  Recent Labs  10/27/15 0220 10/28/15 0228  NA 143 142  K 3.3* 3.5  CL 106 105  CO2 27 23  GLUCOSE 90 83  BUN 19 20  CREATININE 1.19 1.41*  CALCIUM 8.6* 8.9   Liver Function Tests  Recent Labs  10/28/15 0228  AST 25  ALT 20  ALKPHOS 61  BILITOT 2.5*  PROT 6.3*  ALBUMIN 3.5     Telemetry    Atrial fibrillation, HR in 90's - 110's.   ECG    No new tracings.    Cardiac Studies and Radiology    Dg Chest 2 View: 10/26/2015  CLINICAL DATA:  Patient with leg swelling and  fluid retention. EXAM: CHEST  2 VIEW COMPARISON:  Chest radiograph 10/24/2015 FINDINGS: Stable cardiomegaly. Pulmonary vascular redistribution and mild interstitial pulmonary opacities bilaterally. No definite pleural effusion. Thoracic spine degenerative changes. Lateral view limited due to overlapping soft tissue. IMPRESSION: Cardiomegaly, pulmonary vascular redistribution and mild interstitial edema. Electronically Signed   By: Annia Belt M.D.   On: 10/26/2015 12:15   Dg Chest 2 View: 10/24/2015  CLINICAL DATA:  Shortness of Breath EXAM: CHEST  2 VIEW COMPARISON:  10/22/2015 FINDINGS: The cardiac shadow is enlarged but stable. The lungs are well aerated bilaterally. No focal infiltrate or sizable effusion is seen. No bony abnormality is noted. IMPRESSION: No active cardiopulmonary disease. Electronically Signed   By: Alcide Clever M.D.   On: 10/24/2015 10:09   Echocardiogram: 09/30/2015 Study Conclusions - Left ventricle: The cavity size was normal. There was severe  concentric hypertrophy. Systolic function was severely reduced.  The estimated ejection fraction was in the range of 20% to 25%.  Diffuse hypokinesis. The study is not technically sufficient to  allow evaluation of LV diastolic function. - Mitral valve: Mildly thickened leaflets . There was trivial  regurgitation. - Left atrium: Moderately dilated at 47 ml/m2. - Right ventricle: The cavity size was mildly dilated. Systolic  function was normal. - Right atrium: Severely dilated at 29 cm2. - Tricuspid valve: There was mild regurgitation. - Pulmonary arteries: PA peak pressure: 49 mm Hg (S). - Inferior vena cava: The vessel was dilated. The respirophasic  diameter changes were blunted (< 50%), consistent with elevated  central venous pressure.  Impressions: - Compared to a prior study in 05/2015, the LVEF has declined to  20-25% with global hypokinesis. A-fib is noted to be present.  Assessment & Plan    1. Acute on Chronic Combined Systolic and Diastolic CHF/ Nonischemic Cardiomopathy - EF by recent echo in 09/2015 showed an EF of 20-25%. Normal cors by cath in 05/2015. - not thought to be a good candidate for advanced medical therapies or AICD due to medical noncompliance. - started on IV Lasix 40mg  BID with a net output of -4.3L thus far. Weight down from 299 lbs to 292 lbs. Weight at time of hospital discharge on 10/15/2015 was 283 lbs, yet this was up to 301 lbs when seen in the office for follow-up on 10/23/2015.Reports a baseline weight of 280 lbs. Would continue IV Lasix at current dose as creatinine has increased from 1.19 to 1.41. Will likely need at minimum an additional 1-2 days of IV diuresis.  - continue BB and ARB. Will restart PTA Spironolactone.   2. Chronic Atrial Fibrillation - This patients CHA2DS2-VASc Score and  unadjusted Ischemic Stroke Rate (% per year) is equal to 2.2 % stroke rate/year from a score of 2 (CHF, HTN). Continue Xarelto. The patient does not take this daily as an outpatient.  - continue BB for rate control. Currently on Toprol-XL 50mg  BID. Will add PRN short-acting Lopressor with parameters. May need to consider increasing Toprol-XL dosing if HR remains elevated.   3. Pulmonary HTN - PA Peak pressure elevated to 49 on recent echo.  - not a good candidate for advanced therapies secondary to noncompliance. - continue diuretic therapy as above.  4. Hypokalemia  - currently resolved. K+ at 3.5 on 10/28/2015.  5. Febrile - temp 101.5 this AM.  - denies any recent increased coughing, urinary symptoms. - per admitting team  6. Acute on Chronic Stage 3 CKD - in the setting of IV diuresis -  was 1.26 at time of admission, creatinine increased to 1.41 on 10/28/2015. - continue to monitor while receiving IV diuresis.    Lorri Frederick , PA-C 8:50 AM 10/28/2015 Pager: [phone]  Attending Note:   The patient was seen and examined.  Agree with assessment and plan as noted above.  Changes made to the above note as needed.  Pt was noncompliant with dietary salt and with his meds. Is diuresing well so far.  Continue meds Will increase Toprol to 75 BID    Vesta Mixer, Montez Hageman., MD, [facility] 10/28/2015, 11:38 AM [address]  [address] Office [phone] Pager [phone]

## 2015-10-28 NOTE — Progress Notes (Addendum)
PROGRESS NOTE  [name]  ANV:[id number] DOB: 11/28/1965  DOA: 10/26/2015 PCP: No PCP Per Patient  Outpatient Specialists:  None known.  Brief Narrative:  50 y.o. male with medical history significant for HTN, HLD, Morbid Obesity, chronic combined CHF with EF 20-25%, nonischemic cardiomyopathy, pulmonary hypertension, Chronic A fib on Xarelto, medical non compliance, recently discharged on 4/25 due to CHF exacerbation, now presenting with increasing shortness of breath, orthopnea, abdominal distention and leg swelling. He was seen at the ED 2 days prior, receiving IV Lasix with some improvement of symptoms. He was discharged with Demadex 40 mg bid but he did not take. Admitted for acute respiratory failure in the setting of acute on chronic combined CHF related to medication and diet tree noncompliance. Cardiology consulting. Improving.   Assessment & Plan:   Principal Problem:   Acute on chronic combined systolic and diastolic heart failure (HCC) Active Problems:   OBESITY, MORBID   Essential hypertension   Asthma   GERD   Sleep apnea   Noncompliance with medication regimen   Chronic atrial fibrillation (HCC)   Pulmonary hypertension (HCC)   Normocytic anemia   Acute renal failure superimposed on stage 3 chronic kidney disease (HCC)   Acute on chronic combined systolic and diastolic CHF/nonischemic cardiomyopathy - 2-D echo 09/2015: LVEF 20-25 %. Normal coronaries by cath 05/2015. - Severe issues with medical noncompliance. - Started on IV Lasix 40 mg twice a day. -4.65 L since admission. Weight down by 7 pounds. Baseline weight 280 pounds. - Noted creatinine has increased from 1.19 > 1.41. Still volume overloaded. Continue IV Lasix for additional one or 2 days. - Continue beta blockers. Skipped today's dose of ARB due to worsening creatinine. - Cardiology follow-up appreciated. Aldactone was added.  Chronic atrial fibrillation - Mostly controlled ventricular rate. -   Continue Xarelto-not fully compliant. - Continue Toprol-XL.  Pulmonary hypertension - Not a good candidate for advanced therapy secondary to noncompliance  Hypokalemia - Replaced  Fever - 101.55F on 5/8 AM. No symptoms and signs suggestive of infectious source. Follow clinically and if he has recurrent fever, may need further workup. - Recent urine microscopy and chest x-ray without acute findings.  Acute on stage III chronic kidney disease - Creatinine has increased from 1.19 on 5/7 to 1.4 on 5/8. - Hold ARB today. Follow BMP in a.m.  Essential hypertension - Mildly uncontrolled. Cardiology is increased Toprol dose. Continue amlodipine.  Morbid obesity/Body mass index is 39.59 kg/(m^2).  Mild anemia - Stable.  OSA - Non-compliant with CPAP.  DVT prophylaxis: Xarelto Code Status: Full Family Communication: None at bedside. Disposition Plan: DC home possibly in 2-3 days.    Consultants:   Cardiology  Procedures:   None  Antimicrobials:   None     Subjective: States that he is improving. Still has orthopnea but getting better. Leg swellings decreasing. No chest pain. Denies cough, nausea, vomiting, diarrhea, dysuria.   Objective:  Filed Vitals:   10/28/15 0520 10/28/15 0949 10/28/15 1026 10/28/15 1216  BP: 129/94 149/89  159/93  Pulse: 107  104 94  Temp: 101.5 F (38.6 C)   98.8 F (37.1 C)  TempSrc: Oral   Oral  Resp: 20   18  Height:      Weight: 132.45 kg (292 lb)     SpO2: 92%   95%    Intake/Output Summary (Last 24 hours) at 10/28/15 1354 Last data filed at 10/28/15 0900  Gross per 24 hour  Intake   1200  ml  Output   2325 ml  Net  -1125 ml   Filed Weights   10/26/15 1822 10/27/15 0343 10/28/15 0520  Weight: 135.9 kg (299 lb 9.7 oz) 133.72 kg (294 lb 12.8 oz) 132.45 kg (292 lb)    Examination:  General exam: Pleasant young male sitting up in bed eating breakfast this morning. Appears calm and comfortable  Respiratory system: Clear to  auscultation. Respiratory effort normal. Cardiovascular system: S1 & S2 heard, RRR.Marland Kitchen No JVD, murmurs, rubs, gallops or clicks. 2+ pitting bilateral leg edema, right >left. Lower extremity varicose veins. Telemetry: A. fib with ventricular rate in the 90s-110s. Gastrointestinal system: Abdomen is nondistended, soft and nontender. No organomegaly or masses felt. Normal bowel sounds heard. Central nervous system: Alert and oriented. No focal neurological deficits. Extremities: Symmetric 5 x 5 power. 2+ bilateral leg edema.? Lower anterior abdominal wall edema. Skin: No rashes, lesions or ulcers Psychiatry: Judgement and insight appear normal. Mood & affect appropriate.     Data Reviewed: I have personally reviewed following labs and imaging studies  CBC:  Recent Labs Lab 10/22/15 1100 10/24/15 0940 10/26/15 1135 10/28/15 0228  WBC 3.5* 3.7* 3.7* 3.8*  NEUTROABS 2.0 2.3  --   --   HGB 11.8* 11.6* 11.6* 12.1*  HCT 36.6* 35.6* 37.0* 38.8*  MCV 86.7 85.6 86.7 87.0  PLT 202 185 206 224   Basic Metabolic Panel:  Recent Labs Lab 10/22/15 1100 10/24/15 0940 10/26/15 1135 10/27/15 0220 10/28/15 0228  NA 140 140 142 143 142  K 3.6 3.5 3.8 3.3* 3.5  CL 107 107 108 106 105  CO2 26 23 24 27 23   GLUCOSE 86 104* 86 90 83  BUN 19 23* 20 19 20   CREATININE 1.26* 1.24 1.15 1.19 1.41*  CALCIUM 8.8* 8.8* 9.0 8.6* 8.9   GFR: Estimated Creatinine Clearance: 91.2 mL/min (by C-G formula based on Cr of 1.41). Liver Function Tests:  Recent Labs Lab 10/28/15 0228  AST 25  ALT 20  ALKPHOS 61  BILITOT 2.5*  PROT 6.3*  ALBUMIN 3.5   No results for input(s): LIPASE, AMYLASE in the last 168 hours. No results for input(s): AMMONIA in the last 168 hours. Coagulation Profile: No results for input(s): INR, PROTIME in the last 168 hours. Cardiac Enzymes:  Recent Labs Lab 10/22/15 1100 10/24/15 0940  TROPONINI 0.03 0.03   BNP (last 3 results) No results for input(s): PROBNP in the last  8760 hours. HbA1C: No results for input(s): HGBA1C in the last 72 hours. CBG: No results for input(s): GLUCAP in the last 168 hours. Lipid Profile: No results for input(s): CHOL, HDL, LDLCALC, TRIG, CHOLHDL, LDLDIRECT in the last 72 hours. Thyroid Function Tests: No results for input(s): TSH, T4TOTAL, FREET4, T3FREE, THYROIDAB in the last 72 hours. Anemia Panel: No results for input(s): VITAMINB12, FOLATE, FERRITIN, TIBC, IRON, RETICCTPCT in the last 72 hours. Urine analysis:    Component Value Date/Time   COLORURINE AMBER* 10/22/2015 1108   APPEARANCEUR CLEAR 10/22/2015 1108   LABSPEC 1.025 10/22/2015 1108   PHURINE 5.5 10/22/2015 1108   GLUCOSEU NEGATIVE 10/22/2015 1108   GLUCOSEU NEG mg/dL 64/40/3474 2595   HGBUR NEGATIVE 10/22/2015 1108   BILIRUBINUR NEGATIVE 10/22/2015 1108   KETONESUR NEGATIVE 10/22/2015 1108   PROTEINUR 100* 10/22/2015 1108   UROBILINOGEN 0.2 08/08/2014 1445   NITRITE NEGATIVE 10/22/2015 1108   LEUKOCYTESUR NEGATIVE 10/22/2015 1108         Radiology Studies: No results found.      Scheduled Meds: .  amLODipine  2.5 mg Oral Daily  . furosemide  40 mg Intravenous BID  . [START ON 10/29/2015] losartan  100 mg Oral Daily  . metoprolol succinate  75 mg Oral BID  . rivaroxaban  20 mg Oral Q supper  . sodium chloride flush  3 mL Intravenous Q12H  . spironolactone  12.5 mg Oral Daily   Continuous Infusions:    LOS: 1 day    Time spent: 25 minutes.    [facility], MD [facility] Pager [phone]  If 7PM-7AM, please contact night-coverage [email or web address] Password [email or web address] 10/28/2015, 1:54 PM

## 2015-10-28 NOTE — Progress Notes (Signed)
Pt refused bed alarm on. 

## 2015-10-29 ENCOUNTER — Inpatient Hospital Stay (HOSPITAL_COMMUNITY): Payer: Medicare Other

## 2015-10-29 DIAGNOSIS — J189 Pneumonia, unspecified organism: Secondary | ICD-10-CM

## 2015-10-29 DIAGNOSIS — D649 Anemia, unspecified: Secondary | ICD-10-CM

## 2015-10-29 LAB — URINALYSIS, ROUTINE W REFLEX MICROSCOPIC
BILIRUBIN URINE: NEGATIVE
Glucose, UA: NEGATIVE mg/dL
Hgb urine dipstick: NEGATIVE
KETONES UR: NEGATIVE mg/dL
Leukocytes, UA: NEGATIVE
NITRITE: NEGATIVE
PROTEIN: 30 mg/dL — AB
Specific Gravity, Urine: 1.019 (ref 1.005–1.030)
pH: 6 (ref 5.0–8.0)

## 2015-10-29 LAB — BASIC METABOLIC PANEL
ANION GAP: 12 (ref 5–15)
BUN: 23 mg/dL — ABNORMAL HIGH (ref 6–20)
CALCIUM: 8.7 mg/dL — AB (ref 8.9–10.3)
CO2: 26 mmol/L (ref 22–32)
CREATININE: 1.5 mg/dL — AB (ref 0.61–1.24)
Chloride: 105 mmol/L (ref 101–111)
GFR calc non Af Amer: 54 mL/min — ABNORMAL LOW (ref >60)
Glucose, Bld: 89 mg/dL (ref 65–99)
Potassium: 3.4 mmol/L — ABNORMAL LOW (ref 3.5–5.1)
SODIUM: 143 mmol/L (ref 135–145)

## 2015-10-29 LAB — CBC WITH DIFFERENTIAL/PLATELET
BASOS ABS: 0 10*3/uL (ref 0.0–0.1)
BASOS PCT: 0 %
EOS ABS: 0 10*3/uL (ref 0.0–0.7)
Eosinophils Relative: 0 %
HCT: 33.8 % — ABNORMAL LOW (ref 39.0–52.0)
HEMOGLOBIN: 10.9 g/dL — AB (ref 13.0–17.0)
Lymphocytes Relative: 9 %
Lymphs Abs: 0.6 10*3/uL — ABNORMAL LOW (ref 0.7–4.0)
MCH: 27.9 pg (ref 26.0–34.0)
MCHC: 32.2 g/dL (ref 30.0–36.0)
MCV: 86.7 fL (ref 78.0–100.0)
MONOS PCT: 7 %
Monocytes Absolute: 0.4 10*3/uL (ref 0.1–1.0)
NEUTROS PCT: 84 %
Neutro Abs: 5.3 10*3/uL (ref 1.7–7.7)
Platelets: 194 10*3/uL (ref 150–400)
RBC: 3.9 MIL/uL — AB (ref 4.22–5.81)
RDW: 15.2 % (ref 11.5–15.5)
WBC: 6.3 10*3/uL (ref 4.0–10.5)

## 2015-10-29 LAB — URINE MICROSCOPIC-ADD ON: RBC / HPF: NONE SEEN RBC/hpf (ref 0–5)

## 2015-10-29 LAB — MAGNESIUM: MAGNESIUM: 1.8 mg/dL (ref 1.7–2.4)

## 2015-10-29 LAB — LACTIC ACID, PLASMA: Lactic Acid, Venous: 1.1 mmol/L (ref 0.5–2.0)

## 2015-10-29 LAB — GLUCOSE, CAPILLARY: Glucose-Capillary: 93 mg/dL (ref 65–99)

## 2015-10-29 MED ORDER — VANCOMYCIN HCL IN DEXTROSE 1-5 GM/200ML-% IV SOLN
1000.0000 mg | Freq: Two times a day (BID) | INTRAVENOUS | Status: DC
  Administered 2015-10-29 – 2015-10-31 (×3): 1000 mg via INTRAVENOUS
  Filled 2015-10-29 (×4): qty 200

## 2015-10-29 MED ORDER — POTASSIUM CHLORIDE CRYS ER 20 MEQ PO TBCR
20.0000 meq | EXTENDED_RELEASE_TABLET | Freq: Once | ORAL | Status: DC
  Filled 2015-10-29: qty 1

## 2015-10-29 MED ORDER — METOPROLOL TARTRATE 5 MG/5ML IV SOLN: 5.0000 mg | Freq: Once | INTRAVENOUS | Status: DC

## 2015-10-29 MED ORDER — DEXTROSE 5 % IV SOLN
2.0000 g | Freq: Three times a day (TID) | INTRAVENOUS | Status: DC
  Administered 2015-10-29 – 2015-10-31 (×7): 2 g via INTRAVENOUS
  Filled 2015-10-29 (×9): qty 2

## 2015-10-29 MED ORDER — ACETAMINOPHEN 500 MG PO TABS: 1000.0000 mg | ORAL_TABLET | Freq: Once | ORAL | Status: DC

## 2015-10-29 MED ORDER — VANCOMYCIN HCL 10 G IV SOLR
2500.0000 mg | Freq: Once | INTRAVENOUS | Status: AC
  Administered 2015-10-29: 2500 mg via INTRAVENOUS
  Filled 2015-10-29: qty 2500

## 2015-10-29 MED ORDER — ACETAMINOPHEN 325 MG PO TABS: 650.0000 mg | ORAL_TABLET | ORAL | Status: DC | PRN

## 2015-10-29 MED ORDER — POTASSIUM CHLORIDE CRYS ER 20 MEQ PO TBCR
40.0000 meq | EXTENDED_RELEASE_TABLET | Freq: Two times a day (BID) | ORAL | Status: AC
  Administered 2015-10-29 (×2): 40 meq via ORAL
  Filled 2015-10-29 (×2): qty 2

## 2015-10-29 NOTE — Care Management Note (Signed)
Case Management Note  Patient Details  Name: [name] MRN: [id number] Date of Birth: [birth year]  Subjective/Objective:        Admitted with CHF           Action/Plan: Patient lives at home with his mother, independent of his ADL's. Has private insurance with BCBS with prescription drug coverage; pharmacy of choice is [facility]; patient reports not problem getting his medication.  CM talked to patient about compliancy with medication. Patient states that taking some of his medication makes him "sick and cause his ankle to swell." CM encouraged patient to talk to his physician about this problem and the importance of taking the medication as ordered. Patient stated " if it makes me sick, I'm not going to take it." CM also talked to patient about his diet. Patient states that he eats fried chicken, hot dogs, fries that his mother cooks. CM attempted to talk to patient about a heart health diet but patient states that he is doing the best that he can and it is expensive to eat healthy. Very difficult to talk to patient about eating healthy at this time because he does not see it as a problem / high sodium foods.  He has scales at home and weigh himself daily, has a HHRN - he states that she visits every other day for 5 - 10 minutes.   Barriers  * patient does not take his medication as directed  *patient does not see the connection between eating a high sodium diet and his body retaining fluid. He gives me lots of reasons for not eating a heart healthy diet. CM will continue to follow up and talk to patient again about lifestyle changes because he thinks that he is not getting proper care as to why he is frequently readmitted. Dietary referral made.  Expected Discharge Date:     Possibly 11/02/2015             Expected Discharge Plan:  Home w Home Health Services  In-House Referral:   Dietary  Discharge planning Services  CM Consult  Choice offered to:  Patient  HH Arranged:   RN, Disease Management HH Agency:  [facility]  Status of Service:  In process, will continue to follow  Cherrie Distance, RN 10/29/2015, 10:18 AM

## 2015-10-29 NOTE — Progress Notes (Signed)
RD consulted for nutrition education. Pt asleep at time of visit. RD to follow-up in AM for education.  Dorothea Ogle RD, LDN Inpatient Clinical Dietitian Pager: [phone] After Hours Pager: [phone]

## 2015-10-29 NOTE — Progress Notes (Addendum)
      Notified NP Craige Cotta that patient spiked a fever 102, few beats of VTach, elevated BP and increased lethargy. Bed alarm placed. Will continue to monitor for patient safety.

## 2015-10-29 NOTE — Progress Notes (Signed)
Pharmacy Antibiotic Note  [name] is a 50 y.o. male admitted on 10/26/2015 with acute respiratory failure, now w/ possible PNA on CXR.  Pharmacy has been consulted for vancomycin and cefepime dosing.  Plan: Vancomycin 2500mg  x1 followed by 1000mg  IV every 12 hours.  Goal trough 15-20 mcg/mL.  Cefepime 2g IV every 8 hours.  Height: 6' (182.9 cm) Weight: 292 lb (132.45 kg) (scale a) IBW/kg (Calculated) : 77.6  Temp (24hrs), Avg:100 F (37.8 C), Min:98.8 F (37.1 C), Max:102 F (38.9 C)   Recent Labs Lab 10/22/15 1100 10/24/15 0940 10/26/15 1135 10/27/15 0220 10/28/15 0228 10/29/15 0329  WBC 3.5* 3.7* 3.7*  --  3.8*  --   CREATININE 1.26* 1.24 1.15 1.19 1.41* 1.50*    Estimated Creatinine Clearance: 85.8 mL/min (by C-G formula based on Cr of 1.5).    Allergies  Allergen Reactions  . Bee Venom Shortness Of Breath and Swelling  . Lipitor [Atorvastatin] Shortness Of Breath and Other (See Comments)    Nose bleed  . Aspirin Other (See Comments)    Chewable 81 mg tablets upsets patients stomach  . Banana Diarrhea  . Diltiazem Other (See Comments)    Makes heart beat fast  . Hydralazine Other (See Comments)    Patient states it causes Tachycardia     Thank you for allowing pharmacy to be a part of this patient's care.  Vernard Gambles, PharmD, BCPS  10/29/2015 5:51 AM

## 2015-10-29 NOTE — Progress Notes (Addendum)
Pt OOB to chair refused to have chair alarm" State "I will be all right.  Am not going to fall."  Pt's nurse made aware.  Will continue to monitor.  Amanda Pea, Charity fundraiser.Marland Kitchen

## 2015-10-29 NOTE — Progress Notes (Addendum)
Hospital Problem List     Principal Problem:   Acute on chronic combined systolic and diastolic heart failure (HCC) Active Problems:   OBESITY, MORBID   Essential hypertension   Asthma   GERD   Sleep apnea   Noncompliance with medication regimen   Chronic atrial fibrillation (HCC)   Pulmonary hypertension (HCC)   Normocytic anemia   Acute renal failure superimposed on stage 3 chronic kidney disease (HCC)   HCAP (healthcare-associated pneumonia)    Patient Profile:   Primary Cardiologist: Dr. Purvis Sheffield  50 yo male w/ PMH of chronic combined systolic and diastolic CHF (EF 16-10%), nonischemic cardiomyopathy, pulm HTN, chronic atrial fibrillation and medical noncompliance who presented to [facility] on 10/26/2015 for worsening dyspnea and lower extremity edema.   Subjective   Nauseated this AM, now resolved. Says his breathing is similar to yesterday. Denies any chest pain or palpitations.  Inpatient Medications    . amLODipine  2.5 mg Oral Daily  . ceFEPime (MAXIPIME) IV  2 g Intravenous Q8H  . furosemide  40 mg Intravenous BID  . metoprolol succinate  75 mg Oral BID  . potassium chloride  20 mEq Oral Once  . rivaroxaban  20 mg Oral Q supper  . sodium chloride flush  3 mL Intravenous Q12H  . spironolactone  12.5 mg Oral Daily  . vancomycin  1,000 mg Intravenous Q12H    Vital Signs    Filed Vitals:   10/29/15 0502 10/29/15 0510 10/29/15 0639 10/29/15 1125  BP: 158/109  143/92 119/77  Pulse: 115  90 99  Temp:  102 F (38.9 C) 100.3 F (37.9 C) 99.6 F (37.6 C)  TempSrc:  Oral Oral Oral  Resp:    18  Height:      Weight:   291 lb (131.997 kg)   SpO2:  94%  96%    Intake/Output Summary (Last 24 hours) at 10/29/15 1208 Last data filed at 10/29/15 1125  Gross per 24 hour  Intake   1790 ml  Output   2650 ml  Net   -860 ml   Filed Weights   10/27/15 0343 10/28/15 0520 10/29/15 0639  Weight: 294 lb 12.8 oz (133.72 kg) 292 lb (132.45 kg) 291 lb (131.997  kg)    Physical Exam    General: Obese African American male appearing in no acute distress. Head: Normocephalic, atraumatic.  Neck: Supple without bruits, JVD remains elevated. Lungs:  Resp regular and unlabored, CTA without wheezing or rales. Heart: Irregularly irregular, S1, S2, no S3, S4, or murmur; no rub. Abdomen: Soft, non-tender, non-distended with normoactive bowel sounds. No hepatomegaly. No rebound/guarding. No obvious abdominal masses. Extremities: No clubbing, cyanosis, 1+ edema bilaterally. Distal pedal pulses are 2+ bilaterally. Neuro: Alert and oriented X 3. Moves all extremities spontaneously. Psych: Normal affect.  Labs    CBC  Recent Labs  10/28/15 0228 10/29/15 0604  WBC 3.8* 6.3  NEUTROABS  --  5.3  HGB 12.1* 10.9*  HCT 38.8* 33.8*  MCV 87.0 86.7  PLT 224 194   Basic Metabolic Panel  Recent Labs  10/28/15 0228 10/29/15 0329 10/29/15 0604  NA 142 143  --   K 3.5 3.4*  --   CL 105 105  --   CO2 23 26  --   GLUCOSE 83 89  --   BUN 20 23*  --   CREATININE 1.41* 1.50*  --   CALCIUM 8.9 8.7*  --   MG  --   --  1.8   Liver Function Tests  Recent Labs  10/28/15 0228  AST 25  ALT 20  ALKPHOS 61  BILITOT 2.5*  PROT 6.3*  ALBUMIN 3.5    Telemetry    Atrial fibrillation, HR in 80's - 110's. 13 beats NSVT yesterday evening.  ECG    No new tracings.    Cardiac Studies and Radiology    Dg Chest 2 View: 10/26/2015  CLINICAL DATA:  Patient with leg swelling and fluid retention. EXAM: CHEST  2 VIEW COMPARISON:  Chest radiograph 10/24/2015 FINDINGS: Stable cardiomegaly. Pulmonary vascular redistribution and mild interstitial pulmonary opacities bilaterally. No definite pleural effusion. Thoracic spine degenerative changes. Lateral view limited due to overlapping soft tissue. IMPRESSION: Cardiomegaly, pulmonary vascular redistribution and mild interstitial edema. Electronically Signed   By: Annia Belt M.D.   On: 10/26/2015 12:15    Dg Chest  Port 1 View: 10/29/2015  CLINICAL DATA:  Fever and weakness.  Dyspnea tonight. EXAM: PORTABLE CHEST 1 VIEW COMPARISON:  10/26/2015 FINDINGS: There is moderate cardiomegaly, unchanged. Patchy consolidation in the right upper lobe and right lower lobe may represent pneumonia or asymmetric alveolar edema. No large effusions. IMPRESSION: New patchy airspace opacity in the right lung, consistent with pneumonia or asymmetric alveolar edema. Electronically Signed   By: Ellery Plunk M.D.   On: 10/29/2015 05:29   Echocardiogram: 09/30/2015 Study Conclusions - Left ventricle: The cavity size was normal. There was severe  concentric hypertrophy. Systolic function was severely reduced.  The estimated ejection fraction was in the range of 20% to 25%.  Diffuse hypokinesis. The study is not technically sufficient to  allow evaluation of LV diastolic function. - Mitral valve: Mildly thickened leaflets . There was trivial  regurgitation. - Left atrium: Moderately dilated at 47 ml/m2. - Right ventricle: The cavity size was mildly dilated. Systolic  function was normal. - Right atrium: Severely dilated at 29 cm2. - Tricuspid valve: There was mild regurgitation. - Pulmonary arteries: PA peak pressure: 49 mm Hg (S). - Inferior vena cava: The vessel was dilated. The respirophasic  diameter changes were blunted (< 50%), consistent with elevated  central venous pressure.  Impressions: - Compared to a prior study in 05/2015, the LVEF has declined to  20-25% with global hypokinesis. A-fib is noted to be present.  Assessment & Plan    1. Acute on Chronic Combined Systolic and Diastolic CHF/ Nonischemic Cardiomopathy - EF by recent echo in 09/2015 showed an EF of 20-25%. Normal cors by cath in 05/2015. - not thought to be a good candidate for advanced medical therapies or AICD due to medical noncompliance. - started on IV Lasix  BID with a net output of -5.5L thus far. Weight down from 299 lbs to  291 lbs. Weight at time of hospital discharge on 10/15/2015 was 283 lbs, yet this was up to 301 lbs when seen in the office for follow-up on 10/23/2015.Reports a baseline weight of 280 lbs. Would continue IV Lasix at current dose. If creatinine continues to trend upwards, may need to switch back to PO prior to reaching his dry weight. - continue BB and Spironolactone. ARB held in setting of AKI.  2. Chronic Atrial Fibrillation - This patients CHA2DS2-VASc Score and unadjusted Ischemic Stroke Rate (% per year) is equal to 2.2 % stroke rate/year from a score of 2 (CHF, HTN). Continue Xarelto. The patient does not take this daily as an outpatient.  - continue BB for rate control. Toprol-XL recently increased to  BID on 10/28/2015.  Would expect HR to improve with diuresis and treatment of PNA.  3. Pulmonary HTN - PA Peak pressure elevated to 49 on recent echo.  - not a good candidate for advanced therapies secondary to noncompliance. - continue diuretic therapy as above.  4. Hypokalemia - K+ at 3.4 this AM. - will replace.  5. New Right Lung Opacity/Febrile - temp 102 F this AM. CXR shows new opacity in the right lung. - started on IV Cefepime and Vancomycin for HCAP. - per admitting team  6. Acute on Chronic Stage 3 CKD - in the setting of IV diuresis - was 1.26 at time of admission, creatinine increased to 1.50 on 10/29/2015. - continue to monitor while receiving IV diuresis. ARB held.  Lorri Frederick , PA-C 12:08 PM 10/29/2015 Pager: [phone]   Attending Note:   The patient was seen and examined.  Agree with assessment and plan as noted above.  Changes made to the above note as needed.  Pt has done well with IV Lasix . I would change back to his normal dose of PO lasix in the next day or so I agree with Dr. Fabio Bering original note that PO Demadex may work better but the patient insists on taking Lasix   He is at / close to his usual dry weight.  He reportedly  had 13 beat run of NS VT.   I am not able to see it on the monitor review today . Potassium  was low.   Has been replaced.  On toprolol   Follow up with Dr. Shona Needles, Montez Hageman., MD, [facility] 10/29/2015, 1:13 PM [address]  [address] Office [phone] Pager [phone]

## 2015-10-29 NOTE — Progress Notes (Signed)
PROGRESS NOTE  [name]  ZOX:[id number] DOB: March 16, 1966  DOA: 10/26/2015 PCP: No PCP Per Patient  Outpatient Specialists:  None known.  Brief Narrative:  50 y.o. male with medical history significant for HTN, HLD, Morbid Obesity, chronic combined CHF with EF 20-25%, nonischemic cardiomyopathy, pulmonary hypertension, Chronic A fib on Xarelto, medical non compliance, recently discharged on 4/25 due to CHF exacerbation, now presenting with increasing shortness of breath, orthopnea, abdominal distention and leg swelling. He was seen at the ED 2 days prior, receiving IV Lasix with some improvement of symptoms. He was discharged with Demadex 40 mg bid but he did not take. Admitted for acute respiratory failure in the setting of acute on chronic combined CHF related to medication and diet tree noncompliance. Cardiology consulting. Febrile. Repeat chest x-ray suggests pneumonia.   Assessment & Plan:   Principal Problem:   Acute on chronic combined systolic and diastolic heart failure (HCC) Active Problems:   OBESITY, MORBID   Essential hypertension   Asthma   GERD   Sleep apnea   Noncompliance with medication regimen   Chronic atrial fibrillation (HCC)   Pulmonary hypertension (HCC)   Normocytic anemia   Acute renal failure superimposed on stage 3 chronic kidney disease (HCC)   Acute on chronic combined systolic and diastolic CHF/nonischemic cardiomyopathy - 2-D echo 09/2015: LVEF 20-25 %. Normal coronaries by cath 05/2015. - Severe issues with medical noncompliance. - Started on IV Lasix 40 mg twice a day. - 5.518 L since admission. Weight down from 305 > 291 pounds. Baseline weight 280 pounds. - Noted creatinine has increased from 1.19 > 1.41 >1.5. Still volume overloaded. Continue IV Lasix for additional one or 2 days. - Continue beta blockers. No ARB on 5/8. Discontinued ARB temporarily from 5/9 given worsening creatinine. - Cardiology follow-up appreciated. Aldactone was  added.  Fever/healthcare associated pneumonia - High-grade fevers for the last 2 nights. Gives history of minimal nonproductive cough.? Nausea and an episode of vomiting overnight. No dysuria. - Urine microscopy: Not suggestive of UTI. Blood cultures drawn. Lactate normal. No leukocytosis. - Chest x-ray 5/9: New patchy airspace opacity in the right lung, consistent with pneumonia or asymmetric alveolar edema - Started IV cefepime and vancomycin on 5/9. Monitor.  Chronic atrial fibrillation - Mostly controlled ventricular rate. -  Continue Xarelto-not fully compliant. - Continue Toprol-XL.  Pulmonary hypertension - Not a good candidate for advanced therapy secondary to noncompliance  Hypokalemia - Replace and follow. Magnesium 1.8.  Acute on stage III chronic kidney disease - Creatinine has increased from 1.19 on 5/7 to 1.4 on 5/8. Creatinine worsened to 1.5 on 5/9. - Held ARB on 5/8 and discontinued temporarily on 5/90 due to rising creatinine.  Essential hypertension - Mildly uncontrolled. Cardiology is increased Toprol dose. Continue amlodipine.  Morbid obesity/Body mass index is 39.46 kg/(m^2).  Mild anemia - Stable.  OSA - Non-compliant with CPAP.  NSVT - Patient had 13 beat NSVT last night. Monitor on telemetry. Continue beta blockers. Replace potassium.  DVT prophylaxis: Xarelto Code Status: Full Family Communication: None at bedside. Disposition Plan: DC home possibly in 2-3 days.    Consultants:   Cardiology  Procedures:   None  Antimicrobials:   IV vancomycin 5/9 >  IV cefepime 5/9 >   Subjective: Overnight events noted, fever and lethargy. Dyspnea improving. Leg edema improving. No chest pain.  Objective:  Filed Vitals:   10/29/15 0502 10/29/15 0510 10/29/15 0639 10/29/15 1125  BP: 158/109  143/92 119/77  Pulse: 115  90 99  Temp:  102 F (38.9 C) 100.3 F (37.9 C) 99.6 F (37.6 C)  TempSrc:  Oral Oral Oral  Resp:    18  Height:       Weight:   131.997 kg (291 lb)   SpO2:  94%  96%    Intake/Output Summary (Last 24 hours) at 10/29/15 1134 Last data filed at 10/29/15 1125  Gross per 24 hour  Intake   1790 ml  Output   2650 ml  Net   -860 ml   Filed Weights   10/27/15 0343 10/28/15 0520 10/29/15 0639  Weight: 133.72 kg (294 lb 12.8 oz) 132.45 kg (292 lb) 131.997 kg (291 lb)    Examination:  General exam: Pleasant young male Lying comfortably propped up in bed. Appears slightly lethargic but not toxic or septic.  Respiratory system: Clear to auscultation. Respiratory effort normal. Cardiovascular system: S1 & S2 heard, RRR.Marland Kitchen No JVD, murmurs, rubs, gallops or clicks. 2+ pitting bilateral leg edema, right >left. Lower extremity varicose veins. Telemetry: A. fib with ventricular rate in the 90s-110s. Gastrointestinal system: Abdomen is nondistended, soft and nontender. No organomegaly or masses felt. Normal bowel sounds heard. Central nervous system: Alert and oriented. No focal neurological deficits. Extremities: Symmetric 5 x 5 power. 2+ bilateral leg edema.? Lower anterior abdominal wall edema. Skin: No rashes, lesions or ulcers Psychiatry: Judgement and insight appear normal. Mood & affect appropriate.     Data Reviewed: I have personally reviewed following labs and imaging studies  CBC:  Recent Labs Lab 10/24/15 0940 10/26/15 1135 10/28/15 0228 10/29/15 0604  WBC 3.7* 3.7* 3.8* 6.3  NEUTROABS 2.3  --   --  5.3  HGB 11.6* 11.6* 12.1* 10.9*  HCT 35.6* 37.0* 38.8* 33.8*  MCV 85.6 86.7 87.0 86.7  PLT 185 206 224 194   Basic Metabolic Panel:  Recent Labs Lab 10/24/15 0940 10/26/15 1135 10/27/15 0220 10/28/15 0228 10/29/15 0329 10/29/15 0604  NA 140 142 143 142 143  --   K 3.5 3.8 3.3* 3.5 3.4*  --   CL 107 108 106 105 105  --   CO2 --   GLUCOSE 104* 86 90 83 89  --   BUN 23* 23*  --   CREATININE 1.24 1.15 1.19 1.41* 1.50*  --   CALCIUM 8.8* 9.0 8.6* 8.9 8.7*  --    MG  --   --   --   --   --  1.8   GFR: Estimated Creatinine Clearance: 85.6 mL/min (by C-G formula based on Cr of 1.5). Liver Function Tests:  Recent Labs Lab 10/28/15 0228  AST 25  ALT 20  ALKPHOS 61  BILITOT 2.5*  PROT 6.3*  ALBUMIN 3.5   No results for input(s): LIPASE, AMYLASE in the last 168 hours. No results for input(s): AMMONIA in the last 168 hours. Coagulation Profile: No results for input(s): INR, PROTIME in the last 168 hours. Cardiac Enzymes:  Recent Labs Lab 10/24/15 0940  TROPONINI 0.03   BNP (last 3 results) No results for input(s): PROBNP in the last 8760 hours. HbA1C: No results for input(s): HGBA1C in the last 72 hours. CBG:  Recent Labs Lab 10/29/15 0736  GLUCAP 93   Lipid Profile: No results for input(s): CHOL, HDL, LDLCALC, TRIG, CHOLHDL, LDLDIRECT in the last 72 hours. Thyroid Function Tests: No results for input(s): TSH, T4TOTAL, FREET4, T3FREE, THYROIDAB in the last 72 hours. Anemia Panel: No results  for input(s): VITAMINB12, FOLATE, FERRITIN, TIBC, IRON, RETICCTPCT in the last 72 hours. Urine analysis:    Component Value Date/Time   COLORURINE YELLOW 10/29/2015 1012   APPEARANCEUR CLEAR 10/29/2015 1012   LABSPEC 1.019 10/29/2015 1012   PHURINE 6.0 10/29/2015 1012   GLUCOSEU NEGATIVE 10/29/2015 1012   GLUCOSEU NEG mg/dL 49/70/2637 8588   HGBUR NEGATIVE 10/29/2015 1012   BILIRUBINUR NEGATIVE 10/29/2015 1012   KETONESUR NEGATIVE 10/29/2015 1012   PROTEINUR 30* 10/29/2015 1012   UROBILINOGEN 0.2 08/08/2014 1445   NITRITE NEGATIVE 10/29/2015 1012   LEUKOCYTESUR NEGATIVE 10/29/2015 1012         Radiology Studies: Dg Chest Port 1 View  10/29/2015  CLINICAL DATA:  Fever and weakness.  Dyspnea tonight. EXAM: PORTABLE CHEST 1 VIEW COMPARISON:  10/26/2015 FINDINGS: There is moderate cardiomegaly, unchanged. Patchy consolidation in the right upper lobe and right lower lobe may represent pneumonia or asymmetric alveolar edema. No  large effusions. IMPRESSION: New patchy airspace opacity in the right lung, consistent with pneumonia or asymmetric alveolar edema. Electronically Signed   By: Ellery Plunk M.D.   On: 10/29/2015 05:29        Scheduled Meds: . amLODipine  2.5 mg Oral Daily  . ceFEPime (MAXIPIME) IV  2 g Intravenous Q8H  . furosemide  40 mg Intravenous BID  . metoprolol succinate  75 mg Oral BID  . potassium chloride  20 mEq Oral Once  . rivaroxaban  20 mg Oral Q supper  . sodium chloride flush  3 mL Intravenous Q12H  . spironolactone  12.5 mg Oral Daily  . vancomycin  1,000 mg Intravenous Q12H   Continuous Infusions:    LOS: 2 days    Time spent: 25 minutes.    [facility], MD [facility] Pager [phone]  If 7PM-7AM, please contact night-coverage [email or web address] Password [email or web address] 10/29/2015, 11:34 AM

## 2015-10-29 NOTE — Progress Notes (Addendum)
Pt spiked fever to 102 f. Orders-UA, culture, blood cultures, LA, CBC with diff, CXR showed PNA. Cefepime and Vanc started for HAP. Tylenol. Few beats of VT. K low, repleted. Mg added on. Hold IVF due to very low EF. Craige Cotta

## 2015-10-30 LAB — BASIC METABOLIC PANEL
ANION GAP: 12 (ref 5–15)
BUN: 25 mg/dL — ABNORMAL HIGH (ref 6–20)
CO2: 26 mmol/L (ref 22–32)
Calcium: 8.8 mg/dL — ABNORMAL LOW (ref 8.9–10.3)
Chloride: 106 mmol/L (ref 101–111)
Creatinine, Ser: 1.42 mg/dL — ABNORMAL HIGH (ref 0.61–1.24)
GFR calc Af Amer: >60 mL/min (ref >60)
GFR, EST NON AFRICAN AMERICAN: 58 mL/min — AB (ref >60)
GLUCOSE: 107 mg/dL — AB (ref 65–99)
POTASSIUM: 3.7 mmol/L (ref 3.5–5.1)
Sodium: 144 mmol/L (ref 135–145)

## 2015-10-30 LAB — CBC
HEMATOCRIT: 37.1 % — AB (ref 39.0–52.0)
Hemoglobin: 11.6 g/dL — ABNORMAL LOW (ref 13.0–17.0)
MCH: 26.9 pg (ref 26.0–34.0)
MCHC: 31.3 g/dL (ref 30.0–36.0)
MCV: 86.1 fL (ref 78.0–100.0)
PLATELETS: 240 10*3/uL (ref 150–400)
RBC: 4.31 MIL/uL (ref 4.22–5.81)
RDW: 15 % (ref 11.5–15.5)
WBC: 4.7 10*3/uL (ref 4.0–10.5)

## 2015-10-30 NOTE — Progress Notes (Signed)
[facility] Progress Note                                            Patient Demographics:    [name], is a 50 y.o. male, DOB - May 17, 1966, TDV:[id number]  Admit date - 10/26/2015   Admitting Physician Pete Glatter, MD  Outpatient Primary MD for the patient is No PCP Per Patient  LOS - 3  Outpatient Specialists  Chief Complaint  Patient presents with  . Leg Swelling  . Shortness of Breath  . Chest Pain        Subjective:    [name] dyspnea has been improving but still persistent, no chest pain or palpitations, no nausea or vomiting, tolerating po well.    Assessment  & Plan :    Principal Problem:   Acute on chronic combined systolic and diastolic heart failure (HCC) Active Problems:   OBESITY, MORBID   Essential hypertension   Asthma   GERD   Sleep apnea   Noncompliance with medication regimen   Chronic atrial fibrillation (HCC)   Pulmonary hypertension (HCC)   Normocytic anemia   Acute renal failure superimposed on stage 3 chronic kidney disease (HCC)   HCAP (healthcare-associated pneumonia)   1.Cardiovascular. Will continue diuresis with furosemide to target negative fluid balance, will continue amlodipine, metoprolol, and xarelto. Continue aldactone.  2. Pulomonary,. Will continue furosemide for diuresis, will continue oxymetry monitor and 02 per Kief supplemental. Continue antibiotic therapy for pneumonia with cefepime and vanc for possibel HCAP.  3. Nephrology cr at 1.32 with K at 3.7 and na at 144,. Will continue diuresis with furosemide, since admission negative 6843 cc.    4, Hematolology anemia stable.     Lab Results  Component Value Date     PLT 240 10/30/2015      Anti-infectives    Start     Dose/Rate Route Frequency Ordered Stop   10/29/15 2000  vancomycin (VANCOCIN) IVPB 1000 mg/200 mL premix     1,000 mg 200 mL/hr over 60 Minutes Intravenous Every 12 hours 10/29/15 0556     10/29/15 0600  ceFEPIme (MAXIPIME) 2 g in dextrose 5 % 50 mL IVPB     2 g 100 mL/hr over 30 Minutes Intravenous Every 8 hours 10/29/15 0556     10/29/15 0600  vancomycin (VANCOCIN) 2,500 mg in sodium chloride 0.9 % 500 mL IVPB     2,500 mg 250 mL/hr over 120 Minutes Intravenous  Once 10/29/15 0556 10/29/15 0937        Objective:   Filed Vitals:   10/29/15 2006 10/30/15 0516 10/30/15 0735 10/30/15 0820  BP: 116/99 146/107 112/74 121/90  Pulse: 94   79  Temp:  98.6 F (37 C) 98.8 F (37.1 C)    TempSrc: Oral Oral    Resp: 18     Height:      Weight:  129.683 kg (285 lb 14.4 oz)    SpO2: 100% 97%      Wt Readings from Last 3 Encounters:  10/30/15 129.683 kg (285 lb 14.4 oz)  10/24/15 139.254 kg (307 lb)  10/23/15 136.533 kg (301 lb)     Intake/Output Summary (Last 24 hours) at 10/30/15 1234 Last data filed at 10/30/15 0859  Gross per 24 hour  Intake    800 ml  Output   2650 ml  Net  -1850 ml     Physical Exam  Awake Alert, Oriented X 3, No new F.N deficits, Normal affect [place].AT,PERRAL Supple Neck,No JVD, No cervical lymphadenopathy appriciated.  Symmetrical Chest wall movement, decreased breath sounds at bases. With bibasilar rales. RRR,No Gallops,Rubs or new Murmurs, No Parasternal Heave +ve B.Sounds, Abd Soft, No tenderness, No organomegaly appriciated, No rebound - guarding or rigidity. No Cyanosis, Clubbing, ++ pitting edema lower ext.   Data Review:    CBC  Recent Labs Lab 10/24/15 0940 10/26/15 1135 10/28/15 0228 10/29/15 0604 10/30/15 0235  WBC 3.7* 3.7* 3.8* 6.3 4.7  HGB 11.6* 11.6* 12.1* 10.9* 11.6*  HCT 35.6* 37.0* 38.8* 33.8* 37.1*  PLT 185 206 224 194 240  MCV 85.6 86.7 87.0 86.7 86.1  MCH  27.9 27.2 27.1 27.9 26.9  MCHC 32.6 31.4 31.2 32.2 31.3  RDW 14.9 15.0 15.0 15.2 15.0  LYMPHSABS 1.1  --   --  0.6*  --   MONOABS 0.3  --   --  0.4  --   EOSABS 0.1  --   --  0.0  --   BASOSABS 0.0  --   --  0.0  --     Chemistries   Recent Labs Lab 10/26/15 1135 10/27/15 0220 10/28/15 0228 10/29/15 0329 10/29/15 0604 10/30/15 0235  NA 142 143 142 143  --  144  K 3.8 3.3* 3.5 3.4*  --  3.7  CL 108 106 105 105  --  106  CO2 24 27 23 26   --  26  GLUCOSE 86 90 83 89  --  107*  BUN 20 19 20  23*  --  25*  CREATININE 1.15 1.19 1.41* 1.50*  --  1.42*  CALCIUM 9.0 8.6* 8.9 8.7*  --  8.8*  MG  --   --   --   --  1.8  --   AST  --   --  25  --   --   --   ALT  --   --  20  --   --   --   ALKPHOS  --   --  61  --   --   --   BILITOT  --   --  2.5*  --   --   --    ------------------------------------------------------------------------------------------------------------------ No results for input(s): CHOL, HDL, LDLCALC, TRIG, CHOLHDL, LDLDIRECT in the last 72 hours.  Lab Results  Component Value Date   HGBA1C 6.2* 09/29/2015   ------------------------------------------------------------------------------------------------------------------ No results for input(s): TSH, T4TOTAL, T3FREE, THYROIDAB in the last 72 hours.  Invalid input(s): FREET3 ------------------------------------------------------------------------------------------------------------------ No results for input(s): VITAMINB12, FOLATE, FERRITIN, TIBC, IRON, RETICCTPCT in the last 72 hours.  Coagulation profile No results for input(s): INR, PROTIME in the last 168 hours.  No results for input(s): DDIMER in the last 72 hours.  Cardiac  Enzymes  Recent Labs Lab 10/24/15 0940  TROPONINI 0.03   ------------------------------------------------------------------------------------------------------------------    Component Value Date/Time   BNP 659.5* 10/26/2015 1135    Inpatient Medications  Scheduled  Meds: . amLODipine  2.5 mg Oral Daily  . ceFEPime (MAXIPIME) IV  2 g Intravenous Q8H  . furosemide  40 mg Intravenous BID  . metoprolol succinate  75 mg Oral BID  . rivaroxaban  20 mg Oral Q supper  . sodium chloride flush  3 mL Intravenous Q12H  . spironolactone  12.5 mg Oral Daily  . vancomycin  1,000 mg Intravenous Q12H   Continuous Infusions:  PRN Meds:.sodium chloride, acetaminophen, metoprolol tartrate, ondansetron (ZOFRAN) IV, sodium chloride flush  Micro Results No results found for this or any previous visit (from the past 240 hour(s)).  Radiology Reports Dg Chest 2 View  10/26/2015  CLINICAL DATA:  Patient with leg swelling and fluid retention. EXAM: CHEST  2 VIEW COMPARISON:  Chest radiograph 10/24/2015 FINDINGS: Stable cardiomegaly. Pulmonary vascular redistribution and mild interstitial pulmonary opacities bilaterally. No definite pleural effusion. Thoracic spine degenerative changes. Lateral view limited due to overlapping soft tissue. IMPRESSION: Cardiomegaly, pulmonary vascular redistribution and mild interstitial edema. Electronically Signed   By: Annia Belt M.D.   On: 10/26/2015 12:15   Dg Chest 2 View  10/24/2015  CLINICAL DATA:  Shortness of Breath EXAM: CHEST  2 VIEW COMPARISON:  10/22/2015 FINDINGS: The cardiac shadow is enlarged but stable. The lungs are well aerated bilaterally. No focal infiltrate or sizable effusion is seen. No bony abnormality is noted. IMPRESSION: No active cardiopulmonary disease. Electronically Signed   By: Alcide Clever M.D.   On: 10/24/2015 10:09   Dg Chest 2 View  10/07/2015  CLINICAL DATA:  Congestive heart failure. Shortness of breath and leg swelling. EXAM: CHEST  2 VIEW COMPARISON:  09/28/2015 FINDINGS: The heart is enlarged. Mediastinal shadows are unremarkable. There is a small amount of fluid in the fissures but no frank edema or measurable effusion. The lungs are well aerated. No significant bone finding. IMPRESSION: Small amount of  fluid in the fissures. No chest radiographic evidence of pronounced CHF. Electronically Signed   By: Paulina Fusi M.D.   On: 10/07/2015 18:19   Dg Ankle 2 Views Right  10/07/2015  CLINICAL DATA:  Pain and swelling, chronic EXAM: RIGHT ANKLE - 2 VIEW COMPARISON:  Right tibia and fibula March 26, 2011 FINDINGS: Frontal lateral views were obtained. There is marked soft tissue swelling in the ankle region, most severely medially and posteriorly, stable from 2012. No fracture or joint effusion. Ankle mortise appears intact. No appreciable joint space narrowing. There is a spur arising from the posterior calcaneus. IMPRESSION: Marked soft tissue swelling, most severe medially and posteriorly, unchanged from 2012. No fracture or appreciable joint space narrowing. Ankle mortise appears intact. Stable posterior calcaneal spur. Electronically Signed   By: Bretta Bang III M.D.   On: 10/07/2015 20:39   Dg Chest Port 1 View  10/29/2015  CLINICAL DATA:  Fever and weakness.  Dyspnea tonight. EXAM: PORTABLE CHEST 1 VIEW COMPARISON:  10/26/2015 FINDINGS: There is moderate cardiomegaly, unchanged. Patchy consolidation in the right upper lobe and right lower lobe may represent pneumonia or asymmetric alveolar edema. No large effusions. IMPRESSION: New patchy airspace opacity in the right lung, consistent with pneumonia or asymmetric alveolar edema. Electronically Signed   By: Ellery Plunk M.D.   On: 10/29/2015 05:29   Dg Chest Portable 1 View  10/22/2015  CLINICAL DATA:  Two day history of shortness of breath EXAM: PORTABLE CHEST 1 VIEW COMPARISON:  October 07, 2015 FINDINGS: There is no edema or consolidation. There is generalized cardiac enlargement with pulmonary vascularity within normal limits. No adenopathy. No bone lesions. IMPRESSION: Stable generalized cardiomegaly.  No edema or consolidation. Electronically Signed   By: Bretta Bang III M.D.   On: 10/22/2015 10:57     Mauricio Annett Gula M.D on  10/30/2015 at 12:34 PM  Between 7am to 7pm - Pager   After 7pm go to [email or web address] - password [facility]  [facility] -  Office  [phone]

## 2015-10-30 NOTE — Care Management Important Message (Signed)
Important Message  Patient Details  Name: DEMARRIUS OVERALL MRN: [id number] Date of Birth: 09-26-1965   Medicare Important Message Given:  Yes    Illene Sweeting [name] 10/30/2015, 1:38 PM

## 2015-10-30 NOTE — Progress Notes (Addendum)
Pt refused to have bed alarm turned on.  Encourage pt to call before getting OOB to prevent fall as sometimes c/o of having generalized weakness.   Call bell at his bedside. Pt verbalized understanding. Will continue to monitor.  Amanda Pea, Charity fundraiser.

## 2015-10-30 NOTE — Progress Notes (Signed)
Nutrition Education Note  RD consulted for nutrition education regarding new onset CHF.  RD provided "Low Sodium Nutrition Therapy" handout from the Academy of [employer] Dietetics. Reviewed patient's dietary recall.  Breakfast: 2-3 eggs Lunch: Burger out (with pickles, mayo, lettuce, tomato, cheese) Dinner: [employer] 1-3 times per day ( pt did not describe) Provided examples on ways to decrease sodium intake in diet. Discouraged intake of processed foods and use of salt shaker. Encouraged fresh fruits and vegetables as well as whole grain sources of carbohydrates to maximize fiber intake. Provided ideas for more affordable options such as canned fruit and frozen vegetables, unsalted nuts/peanut butter, fried beans and rice.   RD discussed why it is important for patient to adhere to diet recommendations, and emphasized the role of fluids, foods to avoid, and importance of weighing self daily. Teach back method used. Pt unable to voice understanding. He continues to state throughout education that restricting his sodium is not going to help his heart. He feels the medications are causing him to retain fluid. He does not feel restricting his sodium will help his heart or prevent fluid build up. RD re-emphasized the importance of limiting sodium even if pt does not feel it makes a difference.   Expect poor compliance.  Body mass index is 38.77 kg/(m^2). Pt meets criteria for Obesity based on current BMI.  Current diet order is 2 gram Sodium, patient is consuming approximately 100% of meals at this time. Labs and medications reviewed. No further nutrition interventions warranted at this time. RD contact information provided. If additional nutrition issues arise, please re-consult RD.   Dorothea Ogle RD, LDN Inpatient Clinical Dietitian Pager: [phone] After Hours Pager: [phone]

## 2015-10-30 NOTE — Progress Notes (Signed)
Pt stating "I'm  sick on my  Stomach, I think is my blood pressure medicin making me sick.  Don"t give me any more medicine this morning except my lasix."  Offered to give him something for his stomach or call MD pt refused.  Will continue to monitor.  Amanda Pea, Charity fundraiser.

## 2015-10-31 ENCOUNTER — Inpatient Hospital Stay (HOSPITAL_COMMUNITY): Payer: Medicare Other

## 2015-10-31 LAB — BASIC METABOLIC PANEL
Anion gap: 12 (ref 5–15)
BUN: 21 mg/dL — AB (ref 6–20)
CO2: 23 mmol/L (ref 22–32)
Calcium: 8.7 mg/dL — ABNORMAL LOW (ref 8.9–10.3)
Chloride: 106 mmol/L (ref 101–111)
Creatinine, Ser: 1.17 mg/dL (ref 0.61–1.24)
GFR calc Af Amer: >60 mL/min (ref >60)
Glucose, Bld: 97 mg/dL (ref 65–99)
Potassium: 3.5 mmol/L (ref 3.5–5.1)
SODIUM: 141 mmol/L (ref 135–145)

## 2015-10-31 LAB — CBC WITH DIFFERENTIAL/PLATELET
Basophils Absolute: 0 10*3/uL (ref 0.0–0.1)
Basophils Relative: 1 %
EOS ABS: 0.1 10*3/uL (ref 0.0–0.7)
EOS PCT: 3 %
HCT: 34.8 % — ABNORMAL LOW (ref 39.0–52.0)
Hemoglobin: 11.2 g/dL — ABNORMAL LOW (ref 13.0–17.0)
LYMPHS ABS: 1.1 10*3/uL (ref 0.7–4.0)
Lymphocytes Relative: 28 %
MCH: 27.1 pg (ref 26.0–34.0)
MCHC: 32.2 g/dL (ref 30.0–36.0)
MCV: 84.3 fL (ref 78.0–100.0)
MONOS PCT: 8 %
Monocytes Absolute: 0.3 10*3/uL (ref 0.1–1.0)
Neutro Abs: 2.4 10*3/uL (ref 1.7–7.7)
Neutrophils Relative %: 60 %
Platelets: 223 10*3/uL (ref 150–400)
RBC: 4.13 MIL/uL — AB (ref 4.22–5.81)
RDW: 15 % (ref 11.5–15.5)
WBC: 4 10*3/uL (ref 4.0–10.5)

## 2015-10-31 LAB — MAGNESIUM: MAGNESIUM: 1.9 mg/dL (ref 1.7–2.4)

## 2015-10-31 MED ORDER — METOPROLOL SUCCINATE ER 25 MG PO TB24: 75.0000 mg | ORAL_TABLET | Freq: Two times a day (BID) | ORAL | Status: DC

## 2015-10-31 MED ORDER — POTASSIUM CHLORIDE CRYS ER 20 MEQ PO TBCR: 20.0000 meq | EXTENDED_RELEASE_TABLET | Freq: Every day | ORAL | Status: DC

## 2015-10-31 NOTE — Discharge Summary (Signed)
[name], is a 50 y.o. male  DOB 1966-01-04  MRN [id number].  Admission date:  10/26/2015  Admitting Physician  Pete Glatter, MD  Discharge Date:  10/31/2015   Primary MD  No PCP Per Patient  Recommendations for primary care physician for things to follow:   Patient has been reinforced about sodium and fluid restricted diet. Compliance with medications and close follow up with Primary Care.    Admission Diagnosis  Acute on chronic combined systolic and diastolic congestive heart failure (HCC) [I50.43]                                        Suspected health care associated pneumonia   Discharge Diagnosis  Acute on chronic combined systolic and diastolic congestive heart failure (HCC) [I50.43]                                         Cardiogenic pulmonary edema                                        Pneumonia has been ruled out.                                        AKI                                           Principal Problem:   Acute on chronic combined systolic and diastolic heart failure (HCC) Active Problems:   OBESITY, MORBID   Essential hypertension   Asthma   GERD   Sleep apnea   Noncompliance with medication regimen   Chronic atrial fibrillation (HCC)   Pulmonary hypertension (HCC)   Normocytic anemia   Acute renal failure superimposed on stage 3 chronic kidney disease (HCC)   HCAP (healthcare-associated pneumonia)      Past Medical History  Diagnosis Date  . Hypertensive heart disease     Hypertensive heart disease with prior episodes of CHF  . Hyperlipidemia   . Hypertension   . Gastroesophageal reflux disease   . Osteoarthritis   . Peptic ulcer disease   . Morbid obesity (HCC)     /sleep apnea  . Allergic rhinitis     plus h/o asthma  . Tobacco abuse, in remission     Cigarettes discontinued in 2010  . CHF (congestive heart failure) (HCC)   . A-fib (HCC)    2015  . Asthma   . Noncompliance   . Pulmonary hypertension (HCC) 09/19/2015  . Chronic diastolic CHF (congestive  heart failure) [facility])     Past Surgical History  Procedure Laterality Date  . None    . Cardiac catheterization N/A 06/14/2015    Procedure: Right/Left Heart Cath and Coronary Angiography;  Surgeon: Runell Gess, MD;  Location: [facility] INVASIVE CV LAB;  Service: Cardiovascular;  Laterality: N/A;  . Tubal ligation         HPI  from the history and physical done on the day of admission:    This is a 50 y/o male who presents with the chief complain of dyspnea. Recent hospitalization for heart failure decompensation suspected non compliant with medication and cardiac diet. He did not take his torsemide, developed significant dyspnea, orthopnea, and PND. No chest pain. On the initial physical examination his blood pressure systolic was 153, heart rate 73, his lungs had rales at bases bilaterally more right than left, heart s1-s2 present with no gallops or rubs, positive edema bilateral lower ext. His chest film showed congestion with possible infilitrate at the right lung. His renal function had cr at 1.15 with K at 3.8.   Patient was admitted to the hospital with the working diagnosis of decompensated systolic heart failure acute on chronic, complicated with possible health care associated pneumonia.      Hospital Course:     1.Cardiovascular. Patient responded well to diuresis, achieved a negative fluid balance, 6,925 through his hospital stay. Tolerated well IV furosemide, metoprolol succinate dose has been increased to 75 mg bid, and was continue on losartan. Patient was seen by the heart failure team. Patient will continue aldactone and diuresis with torsemide. Anticoagulation with rivaroxaban for a fib.  2. Pulmonary. Cardiogenic pulmonary edema due to decompensated heart failure, responded well to diuresis. On admission started on vanc and cefepime for suspected health care  associated pneumonia, follow chest film after aggressive diuresis showed resolution of right lower lobe infiltrate. Antibiotics will be discontinued, pna ruled out.  3. Nephrology. Renal function was followed with peak cr at 1.5 at the time of discharge down to 1.4, will continue diuresis with torsemide and follow on renal function as outpatient. K at 3.7, patient on aldactone and losartan, will hold on K supplements to avoid hyperkalemia.   Discharge Condition:  Stable   Follow UP  Follow-up Information    Follow up with [facility].   Why:  A home health care nurse will continue to see you at your home   Contact information:   [address] [place] [address] [phone]        Consults obtained - Cardiology.  Diet and Activity recommendation: See Discharge Instructions below  Discharge Instructions    Patient has been discharge home with instruction to follow up with Primary Care in 7 days, follow a sodium and fluid restricted diet. Will hold on K supplements for now.     Discharge Medications       Medication List    STOP taking these medications        furosemide 20 MG tablet  Commonly known as:  LASIX      TAKE these medications        beclomethasone 80 MCG/ACT inhaler  Commonly known as:  QVAR  Inhale 2 puffs into the lungs daily.     losartan 100 MG tablet  Commonly known as:  COZAAR  Take 100 mg by mouth daily.     metoprolol succinate 25 MG 24 hr tablet  Commonly known as:  TOPROL-XL  Take 3 tablets (  75 mg total) by mouth 2 (two) times daily. Take with or immediately following a meal.     potassium chloride SA 20 MEQ tablet  Commonly known as:  K-DUR,KLOR-CON  Take 1 tablet (20 mEq total) by mouth daily. Take with furosemide     rivaroxaban 20 MG Tabs tablet  Commonly known as:  XARELTO  Take 1 tablet (20 mg total) by mouth daily with supper.     spironolactone 25 MG tablet  Commonly known as:  ALDACTONE  Take  0.5 tablets (12.5 mg total) by mouth daily.     torsemide 20 MG tablet  Commonly known as:  DEMADEX  Take 2 tablets (40 mg total) by mouth 2 (two) times daily.        Major procedures and Radiology Reports - PLEASE review detailed and final reports for all details, in brief -      Dg Chest 2 View  10/31/2015  CLINICAL DATA:  Lower extremity swelling, recent pneumonia EXAM: CHEST  2 VIEW COMPARISON:  10/29/2015 FINDINGS: Soft tissues overlying the right lower hemithorax. No frank interstitial edema, improved. No focal consolidation. No definite pleural effusion on the lateral view. Cardiomegaly. IMPRESSION: No frank interstitial edema, improved. No evidence of acute cardiopulmonary disease. Electronically Signed   By: Charline Bills M.D.   On: 10/31/2015 08:41   Dg Chest 2 View  10/26/2015  CLINICAL DATA:  Patient with leg swelling and fluid retention. EXAM: CHEST  2 VIEW COMPARISON:  Chest radiograph 10/24/2015 FINDINGS: Stable cardiomegaly. Pulmonary vascular redistribution and mild interstitial pulmonary opacities bilaterally. No definite pleural effusion. Thoracic spine degenerative changes. Lateral view limited due to overlapping soft tissue. IMPRESSION: Cardiomegaly, pulmonary vascular redistribution and mild interstitial edema. Electronically Signed   By: Annia Belt M.D.   On: 10/26/2015 12:15   Dg Chest 2 View  10/24/2015  CLINICAL DATA:  Shortness of Breath EXAM: CHEST  2 VIEW COMPARISON:  10/22/2015 FINDINGS: The cardiac shadow is enlarged but stable. The lungs are well aerated bilaterally. No focal infiltrate or sizable effusion is seen. No bony abnormality is noted. IMPRESSION: No active cardiopulmonary disease. Electronically Signed   By: Alcide Clever M.D.   On: 10/24/2015 10:09   Dg Chest 2 View  10/07/2015  CLINICAL DATA:  Congestive heart failure. Shortness of breath and leg swelling. EXAM: CHEST  2 VIEW COMPARISON:  09/28/2015 FINDINGS: The heart is enlarged. Mediastinal  shadows are unremarkable. There is a small amount of fluid in the fissures but no frank edema or measurable effusion. The lungs are well aerated. No significant bone finding. IMPRESSION: Small amount of fluid in the fissures. No chest radiographic evidence of pronounced CHF. Electronically Signed   By: Paulina Fusi M.D.   On: 10/07/2015 18:19   Dg Ankle 2 Views Right  10/07/2015  CLINICAL DATA:  Pain and swelling, chronic EXAM: RIGHT ANKLE - 2 VIEW COMPARISON:  Right tibia and fibula March 26, 2011 FINDINGS: Frontal lateral views were obtained. There is marked soft tissue swelling in the ankle region, most severely medially and posteriorly, stable from 2012. No fracture or joint effusion. Ankle mortise appears intact. No appreciable joint space narrowing. There is a spur arising from the posterior calcaneus. IMPRESSION: Marked soft tissue swelling, most severe medially and posteriorly, unchanged from 2012. No fracture or appreciable joint space narrowing. Ankle mortise appears intact. Stable posterior calcaneal spur. Electronically Signed   By: Bretta Bang III M.D.   On: 10/07/2015 20:39   Dg Chest [facility] 1 [address]  10/29/2015  CLINICAL DATA:  Fever and weakness.  Dyspnea tonight. EXAM: PORTABLE CHEST 1 VIEW COMPARISON:  10/26/2015 FINDINGS: There is moderate cardiomegaly, unchanged. Patchy consolidation in the right upper lobe and right lower lobe may represent pneumonia or asymmetric alveolar edema. No large effusions. IMPRESSION: New patchy airspace opacity in the right lung, consistent with pneumonia or asymmetric alveolar edema. Electronically Signed   By: Ellery Plunk M.D.   On: 10/29/2015 05:29   Dg Chest Portable 1 View  10/22/2015  CLINICAL DATA:  Two day history of shortness of breath EXAM: PORTABLE CHEST 1 VIEW COMPARISON:  October 07, 2015 FINDINGS: There is no edema or consolidation. There is generalized cardiac enlargement with pulmonary vascularity within normal limits. No adenopathy. No  bone lesions. IMPRESSION: Stable generalized cardiomegaly.  No edema or consolidation. Electronically Signed   By: Bretta Bang III M.D.   On: 10/22/2015 10:57    Micro Results     Recent Results (from the past 240 hour(s))  Culture, blood (routine x 2)     Status: None (Preliminary result)   Collection Time: 10/29/15  6:12 AM  Result Value Ref Range Status   Specimen Description BLOOD LEFT ANTECUBITAL  Final   Special Requests BOTTLES DRAWN AEROBIC AND ANAEROBIC 8CC  Final   Culture NO GROWTH 1 DAY  Final   Report Status PENDING  Incomplete  Culture, blood (routine x 2)     Status: None (Preliminary result)   Collection Time: 10/29/15  6:18 AM  Result Value Ref Range Status   Specimen Description BLOOD RIGHT HAND  Final   Special Requests BOTTLES DRAWN AEROBIC ONLY 9CC  Final   Culture NO GROWTH 1 DAY  Final   Report Status PENDING  Incomplete       Today   Subjective    [name] Patient is feeling back to his baseline, out of bed.  No chest pain or dyspnea, no nausea or vomiting. Objective   Blood pressure 147/112, pulse 95, temperature 97.9 F (36.6 C), temperature source Oral, resp. rate 18, height 6' (1.829 m), weight 130.046 kg (286 lb 11.2 oz), SpO2 100 %.   Intake/Output Summary (Last 24 hours) at 10/31/15 1255 Last data filed at 10/31/15 0750  Gross per 24 hour  Intake   1303 ml  Output   1800 ml  Net   -497 ml    Exam Awake Alert, Oriented x 3, No new F.N deficits, Normal affect [place].AT,PERRAL Supple Neck,No JVD, No cervical lymphadenopathy appriciated.  Symmetrical Chest wall movement, Good air movement bilaterally, CTAB RRR,No Gallops,Rubs or new Murmurs, No Parasternal Heave +ve B.Sounds, Abd Soft, Non tender, No organomegaly appriciated, No rebound -guarding or rigidity. No Cyanosis, Clubbing. No new Rash or bruise. Positive edema ++   Data Review   CBC w Diff: Lab Results  Component Value Date   WBC 4.0 10/31/2015   HGB 11.2*  10/31/2015   HCT 34.8* 10/31/2015   PLT 223 10/31/2015   LYMPHOPCT 28 10/31/2015   MONOPCT 8 10/31/2015   EOSPCT 3 10/31/2015   BASOPCT 1 10/31/2015    CMP: Lab Results  Component Value Date   NA 141 10/31/2015   K 3.5 10/31/2015   CL 106 10/31/2015   CO2 23 10/31/2015   BUN 21* 10/31/2015   CREATININE 1.17 10/31/2015   CREATININE 1.02 07/07/2011   PROT 6.3* 10/28/2015   ALBUMIN 3.5 10/28/2015   BILITOT 2.5* 10/28/2015   ALKPHOS 61 10/28/2015   AST 25 10/28/2015  ALT 20 10/28/2015  .   Total Time in preparing paper work, data evaluation and todays exam - 45 minutes  Coralie Keens M.D on 10/31/2015 at 12:55 PM  [facility]   Office  [phone]

## 2015-10-31 NOTE — Progress Notes (Signed)
Case discussed in LOS meeting ( long length of stay) with Med Director Dr Jacky Kindle, [facility]. [facility] will no longer be providing services for the patient and [facility] is to resume HHRN due to patient is high risk for readmission due to noncompliance with medication and diet. CM talked to patient about the change in [facility] agencies and patient is in agreement with the DCP; B Shelba Flake [phone]

## 2015-10-31 NOTE — Progress Notes (Signed)
Pt has orders to be discharged. Discharge instructions given and pt has no additional questions at this time. Medication regimen reviewed and pt educated. Pt verbalized understanding and has no additional questions. Telemetry box removed. IV removed and site in good condition. Pt stable and waiting for transportation.   Reginald Mangels RN 

## 2015-10-31 NOTE — Progress Notes (Signed)
Pt's BP runs high 140s/90s-110s each AM around 6AM. Will continue to monitor.

## 2015-10-31 NOTE — Progress Notes (Signed)
Pt reported dizzy spell after 1PM Metorpolol dose yesterday. Will continue to monitor.

## 2015-10-31 NOTE — Progress Notes (Signed)
Heart Failure Navigator Consult Note  Presentation: [name] is a 49 y.o. with diagnosis of chronic diastolic heart failure However last echo done 09/30/14 showed EF 20-25% with elevated EDP and pulmonary HTN Cath done 05/2015 with no CAD.  Multiple admissions for CHF exacerbation due to medical non compliance and high sodium diet. Admits to not taking meds for at least 3 days. Increasing PND, orthopnea, PND and edema No chest pain Has chronic afib and is supposed to be on xarelto but has not taken this either. No syncope , palpitations or bleeding issues.   Past Medical History  Diagnosis Date  . Hypertensive heart disease     Hypertensive heart disease with prior episodes of CHF  . Hyperlipidemia   . Hypertension   . Gastroesophageal reflux disease   . Osteoarthritis   . Peptic ulcer disease   . Morbid obesity (HCC)     /sleep apnea  . Allergic rhinitis     plus h/o asthma  . Tobacco abuse, in remission     Cigarettes discontinued in 2010  . CHF (congestive heart failure) (HCC)   . A-fib (HCC)     2015  . Asthma   . Noncompliance   . Pulmonary hypertension (HCC) 09/19/2015  . Chronic diastolic CHF (congestive heart failure) (HCC)     Social History   Social History  . Marital Status: Divorced    Spouse Name: N/A  . Number of Children: 3  . Years of Education: N/A   Occupational History  . disabled    Social History Main Topics  . Smoking status: Former Smoker -- 0.50 packs/day for 20 years    Types: Cigarettes    Start date: 04/26/1988    Quit date: 06/23/2007  . Smokeless tobacco: Never Used     Comment: 1 ppd former smoker  . Alcohol Use: No     Comment: no etoh since 2009  . Drug Use: No  . Sexual Activity: No   Other Topics Concern  . None   Social History Narrative    ECHO:Study Conclusions-09/30/15  - Left ventricle: The cavity size was normal. There was severe  concentric hypertrophy. Systolic function was severely reduced.  The  estimated ejection fraction was in the range of 20% to 25%.  Diffuse hypokinesis. The study is not technically sufficient to  allow evaluation of LV diastolic function. - Mitral valve: Mildly thickened leaflets . There was trivial  regurgitation. - Left atrium: Moderately dilated at 47 ml/m2. - Right ventricle: The cavity size was mildly dilated. Systolic  function was normal. - Right atrium: Severely dilated at 29 cm2. - Tricuspid valve: There was mild regurgitation. - Pulmonary arteries: PA peak pressure: 49 mm Hg (S). - Inferior vena cava: The vessel was dilated. The respirophasic  diameter changes were blunted (< 50%), consistent with elevated  central venous pressure.  Impressions:  - Compared to a prior study in 05/2015, the LVEF has declined to  20-25% with global hypokinesis. A-fib is noted to be present.  Transthoracic echocardiography. M-mode, complete 2D, spectral Doppler, and color Doppler. Birthdate: Patient birthdate: June 24, 1965. Age: Patient is 50 yr old. Sex: Gender: male. BMI: 37.9 kg/m^2. Blood pressure: 144/90 Patient status: Inpatient. Study date: Study date: 09/30/2015. Study time: 10:05 AM. Location: Bedside.  BNP    Component Value Date/Time   BNP 659.5* 10/26/2015 1135    ProBNP    Component Value Date/Time   PROBNP 746.5* 04/02/2014 1031     Education Assessment and Provision:  Detailed education and instructions provided on heart failure disease management including the following:  Signs and symptoms of Heart Failure When to call the physician Importance of daily weights Low sodium diet Fluid restriction Medication management Anticipated future follow-up appointments  Patient education given on each of the above topics.  Patient acknowledges understanding and acceptance of all instructions.  I spoke at length with [name] regarding his HF.  He tells me that he sees [facility] in [place].  He does not  have reliable transportation and could not come to follow-up in [place].  He has a scale and tells me that he weighs everyday.  He could not say when he calls the physician related to weight increases or signs and symptoms of HF-yet says he goes to the ED.  I reviewed the importance of daily weights and when to call the physician.  I spent a large amount of time reviewing a low sodium diet and high sodium foods to avoid-- he seems to be a bit distrusting and even said he couldn't believe that some foods I reviewed were so high in sodium.  He also told me that he was not taking his fluid pills as ordered due to the fact that they "were not working" and was "worried that they would mess his heart up".  I emphasized the huge importance of taking prescribed medications and explained that he had not even given the medications a chance to work.  He can teach back all topics reviewed--however he has very limited insight into his condition.  He will follow-up with [facility] in [place].  Education Materials:  "Living Better With Heart Failure" Booklet, Daily Weight Tracker Tool    High Risk Criteria for Readmission and/or Poor Patient Outcomes:    EF <30%- Yes 20-25%   2 or more admissions in 6 months- yes 7/6 mo  Difficult social situation- ? no  Demonstrates medication noncompliance- Yes- he says that he does not take "fluid pill" as ordered due to it not working    Barriers of Care:  Health Literacy, Knowledge and compliance  Discharge Planning:   Plans to return to home with [place] with mother.  He would benefit from [facility] for ongoing HF education, compliance and symptom recognition.

## 2015-11-03 LAB — CULTURE, BLOOD (ROUTINE X 2)
CULTURE: NO GROWTH
Culture: NO GROWTH

## 2015-11-04 ENCOUNTER — Ambulatory Visit: Payer: Medicare Other | Admitting: Adult Health

## 2015-11-04 ENCOUNTER — Telehealth: Payer: Self-pay | Admitting: *Deleted

## 2015-11-04 NOTE — Telephone Encounter (Signed)
HHN  Marisue Ivan from [facility] calls and reports that on her home visit today pt BP is 168/110. Nurse states pt refuses to take medication as instructed. Pt instructed on the importance of monitoring his BP and taking his medications as instructed by HHN.

## 2015-11-04 NOTE — Telephone Encounter (Signed)
This patient is being dismissed from our practice.

## 2015-11-05 ENCOUNTER — Encounter (HOSPITAL_COMMUNITY): Payer: Self-pay

## 2015-11-05 ENCOUNTER — Emergency Department (HOSPITAL_COMMUNITY): Payer: Medicare Other

## 2015-11-05 ENCOUNTER — Inpatient Hospital Stay (HOSPITAL_COMMUNITY)
Admission: EM | Admit: 2015-11-05 | Discharge: 2015-11-09 | DRG: 291 | Disposition: A | Discharge status: Home Health | Payer: Medicare Other | Attending: Internal Medicine | Admitting: Internal Medicine

## 2015-11-05 DIAGNOSIS — J45909 Unspecified asthma, uncomplicated: Secondary | ICD-10-CM | POA: Diagnosis present

## 2015-11-05 DIAGNOSIS — I272 Other secondary pulmonary hypertension: Secondary | ICD-10-CM | POA: Diagnosis present

## 2015-11-05 DIAGNOSIS — Z9111 Patient's noncompliance with dietary regimen: Secondary | ICD-10-CM | POA: Diagnosis not present

## 2015-11-05 DIAGNOSIS — Z87891 Personal history of nicotine dependence: Secondary | ICD-10-CM

## 2015-11-05 DIAGNOSIS — E785 Hyperlipidemia, unspecified: Secondary | ICD-10-CM | POA: Diagnosis present

## 2015-11-05 DIAGNOSIS — I110 Hypertensive heart disease with heart failure: Principal | ICD-10-CM | POA: Diagnosis present

## 2015-11-05 DIAGNOSIS — I509 Heart failure, unspecified: Secondary | ICD-10-CM

## 2015-11-05 DIAGNOSIS — Z6841 Body Mass Index (BMI) 40.0 and over, adult: Secondary | ICD-10-CM

## 2015-11-05 DIAGNOSIS — Z8249 Family history of ischemic heart disease and other diseases of the circulatory system: Secondary | ICD-10-CM | POA: Diagnosis not present

## 2015-11-05 DIAGNOSIS — I5043 Acute on chronic combined systolic (congestive) and diastolic (congestive) heart failure: Secondary | ICD-10-CM | POA: Diagnosis present

## 2015-11-05 DIAGNOSIS — Z823 Family history of stroke: Secondary | ICD-10-CM | POA: Diagnosis not present

## 2015-11-05 DIAGNOSIS — I4891 Unspecified atrial fibrillation: Secondary | ICD-10-CM

## 2015-11-05 DIAGNOSIS — Z91148 Patient's other noncompliance with medication regimen for other reason: Secondary | ICD-10-CM

## 2015-11-05 DIAGNOSIS — Z7901 Long term (current) use of anticoagulants: Secondary | ICD-10-CM

## 2015-11-05 DIAGNOSIS — I482 Chronic atrial fibrillation, unspecified: Secondary | ICD-10-CM | POA: Diagnosis present

## 2015-11-05 DIAGNOSIS — J9601 Acute respiratory failure with hypoxia: Secondary | ICD-10-CM | POA: Diagnosis present

## 2015-11-05 DIAGNOSIS — K219 Gastro-esophageal reflux disease without esophagitis: Secondary | ICD-10-CM | POA: Diagnosis present

## 2015-11-05 DIAGNOSIS — G4733 Obstructive sleep apnea (adult) (pediatric): Secondary | ICD-10-CM | POA: Diagnosis present

## 2015-11-05 DIAGNOSIS — R0602 Shortness of breath: Secondary | ICD-10-CM | POA: Diagnosis not present

## 2015-11-05 DIAGNOSIS — R0902 Hypoxemia: Secondary | ICD-10-CM

## 2015-11-05 DIAGNOSIS — Z8711 Personal history of peptic ulcer disease: Secondary | ICD-10-CM | POA: Diagnosis not present

## 2015-11-05 DIAGNOSIS — Z23 Encounter for immunization: Secondary | ICD-10-CM | POA: Diagnosis not present

## 2015-11-05 DIAGNOSIS — Z9114 Patient's other noncompliance with medication regimen: Secondary | ICD-10-CM

## 2015-11-05 LAB — COMPREHENSIVE METABOLIC PANEL
ALK PHOS: 63 U/L (ref 38–126)
ALT: 28 U/L (ref 17–63)
AST: 30 U/L (ref 15–41)
Albumin: 3.9 g/dL (ref 3.5–5.0)
Anion gap: 7 (ref 5–15)
BILIRUBIN TOTAL: 1.6 mg/dL — AB (ref 0.3–1.2)
BUN: 19 mg/dL (ref 6–20)
CHLORIDE: 106 mmol/L (ref 101–111)
CO2: 26 mmol/L (ref 22–32)
Calcium: 8.8 mg/dL — ABNORMAL LOW (ref 8.9–10.3)
Creatinine, Ser: 1 mg/dL (ref 0.61–1.24)
GFR calc non Af Amer: >60 mL/min (ref >60)
GLUCOSE: 87 mg/dL (ref 65–99)
Potassium: 3.5 mmol/L (ref 3.5–5.1)
SODIUM: 139 mmol/L (ref 135–145)
Total Protein: 7.1 g/dL (ref 6.5–8.1)

## 2015-11-05 LAB — CBC WITH DIFFERENTIAL/PLATELET
Basophils Absolute: 0 10*3/uL (ref 0.0–0.1)
Basophils Relative: 0 %
EOS ABS: 0.1 10*3/uL (ref 0.0–0.7)
Eosinophils Relative: 2 %
HEMATOCRIT: 36.8 % — AB (ref 39.0–52.0)
Hemoglobin: 11.9 g/dL — ABNORMAL LOW (ref 13.0–17.0)
LYMPHS ABS: 1 10*3/uL (ref 0.7–4.0)
LYMPHS PCT: 24 %
MCH: 27.9 pg (ref 26.0–34.0)
MCHC: 32.3 g/dL (ref 30.0–36.0)
MCV: 86.4 fL (ref 78.0–100.0)
Monocytes Absolute: 0.4 10*3/uL (ref 0.1–1.0)
Monocytes Relative: 10 %
NEUTROS ABS: 2.7 10*3/uL (ref 1.7–7.7)
NEUTROS PCT: 64 %
Platelets: 263 10*3/uL (ref 150–400)
RBC: 4.26 MIL/uL (ref 4.22–5.81)
RDW: 14.9 % (ref 11.5–15.5)
WBC: 4.3 10*3/uL (ref 4.0–10.5)

## 2015-11-05 LAB — TROPONIN I
TROPONIN I: 0.03 ng/mL (ref <0.031)
Troponin I: 0.03 ng/mL (ref <0.031)

## 2015-11-05 LAB — I-STAT TROPONIN, ED: TROPONIN I, POC: 0 ng/mL (ref 0.00–0.08)

## 2015-11-05 LAB — LIPASE, BLOOD: Lipase: 22 U/L (ref 11–51)

## 2015-11-05 LAB — BRAIN NATRIURETIC PEPTIDE: B Natriuretic Peptide: 499 pg/mL — ABNORMAL HIGH (ref 0.0–100.0)

## 2015-11-05 MED ORDER — LOSARTAN POTASSIUM 50 MG PO TABS
100.0000 mg | ORAL_TABLET | Freq: Every day | ORAL | Status: DC
  Administered 2015-11-05 – 2015-11-09 (×5): 100 mg via ORAL
  Filled 2015-11-05 (×5): qty 2

## 2015-11-05 MED ORDER — NITROGLYCERIN 0.4 MG SL SUBL: 0.4000 mg | SUBLINGUAL_TABLET | SUBLINGUAL | Status: DC | PRN

## 2015-11-05 MED ORDER — SPIRONOLACTONE 25 MG PO TABS
12.5000 mg | ORAL_TABLET | Freq: Every day | ORAL | Status: DC
  Administered 2015-11-05 – 2015-11-09 (×5): 12.5 mg via ORAL
  Filled 2015-11-05 (×5): qty 1

## 2015-11-05 MED ORDER — PNEUMOCOCCAL VAC POLYVALENT 25 MCG/0.5ML IJ INJ
0.5000 mL | INJECTION | INTRAMUSCULAR | Status: DC
  Filled 2015-11-05: qty 0.5

## 2015-11-05 MED ORDER — FUROSEMIDE 10 MG/ML IJ SOLN
80.0000 mg | Freq: Once | INTRAMUSCULAR | Status: AC
Start: 2015-11-05 — End: 2015-11-05
  Administered 2015-11-05: 80 mg via INTRAVENOUS
  Filled 2015-11-05: qty 8

## 2015-11-05 MED ORDER — METOPROLOL SUCCINATE ER 50 MG PO TB24
75.0000 mg | ORAL_TABLET | Freq: Two times a day (BID) | ORAL | Status: DC
  Administered 2015-11-05 – 2015-11-09 (×8): 75 mg via ORAL
  Filled 2015-11-05 (×8): qty 1

## 2015-11-05 MED ORDER — SODIUM CHLORIDE 0.9 % IV SOLN: INTRAVENOUS | Status: DC

## 2015-11-05 MED ORDER — ACETAMINOPHEN 325 MG PO TABS
650.0000 mg | ORAL_TABLET | ORAL | Status: DC | PRN
Start: 2015-11-05 — End: 2015-11-09

## 2015-11-05 MED ORDER — POTASSIUM CHLORIDE CRYS ER 20 MEQ PO TBCR
20.0000 meq | EXTENDED_RELEASE_TABLET | Freq: Two times a day (BID) | ORAL | Status: DC
  Administered 2015-11-05 – 2015-11-09 (×8): 20 meq via ORAL
  Filled 2015-11-05 (×8): qty 1

## 2015-11-05 MED ORDER — SODIUM CHLORIDE 0.9% FLUSH
3.0000 mL | Freq: Two times a day (BID) | INTRAVENOUS | Status: DC
  Administered 2015-11-06 – 2015-11-09 (×7): 3 mL via INTRAVENOUS

## 2015-11-05 MED ORDER — ONDANSETRON HCL 4 MG/2ML IJ SOLN: 4.0000 mg | Freq: Four times a day (QID) | INTRAMUSCULAR | Status: DC | PRN

## 2015-11-05 MED ORDER — SODIUM CHLORIDE 0.9 % IV SOLN
INTRAVENOUS | Status: DC
  Administered 2015-11-05: 11:00:00 via INTRAVENOUS

## 2015-11-05 MED ORDER — RIVAROXABAN 20 MG PO TABS
20.0000 mg | ORAL_TABLET | Freq: Every day | ORAL | Status: DC
  Administered 2015-11-05 – 2015-11-09 (×5): 20 mg via ORAL
  Filled 2015-11-05 (×5): qty 1

## 2015-11-05 MED ORDER — SODIUM CHLORIDE 0.9% FLUSH: 3.0000 mL | INTRAVENOUS | Status: DC | PRN

## 2015-11-05 MED ORDER — ASPIRIN EC 81 MG PO TBEC
81.0000 mg | DELAYED_RELEASE_TABLET | Freq: Every day | ORAL | Status: DC
Start: 2015-11-05 — End: 2015-11-09
  Administered 2015-11-07: 81 mg via ORAL
  Filled 2015-11-05 (×3): qty 1

## 2015-11-05 MED ORDER — SODIUM CHLORIDE 0.9 % IV SOLN: 250.0000 mL | INTRAVENOUS | Status: DC | PRN

## 2015-11-05 MED ORDER — FUROSEMIDE 10 MG/ML IJ SOLN
60.0000 mg | Freq: Two times a day (BID) | INTRAMUSCULAR | Status: DC
  Administered 2015-11-05 – 2015-11-09 (×9): 60 mg via INTRAVENOUS
  Filled 2015-11-05 (×10): qty 6

## 2015-11-05 MED ORDER — ONDANSETRON HCL 4 MG/2ML IJ SOLN
4.0000 mg | Freq: Once | INTRAMUSCULAR | Status: AC
  Administered 2015-11-05: 4 mg via INTRAVENOUS
  Filled 2015-11-05: qty 2

## 2015-11-05 NOTE — ED Notes (Signed)
EDP at bedside  

## 2015-11-05 NOTE — ED Notes (Signed)
Admitting MD at bedside.

## 2015-11-05 NOTE — ED Notes (Signed)
Pt complain of being SOB and some chest pain. States he has fluid build up

## 2015-11-05 NOTE — H&P (Signed)
History and Physical  [name]:[id number] DOB: 03/20/1966 DOA: 11/05/2015  PCP: No PCP Per Patient  Patient coming from: home Cardiologist: Dr. Purvis Sheffield  Chief Complaint: Shortness of breath and lower extremity edema.   HPI:  24 yom with a hx of combined CHF with an EF 20-25%, atrial fibrillation on Xarelto, noncompliance and pulmonary hypertension with 8 admissions in the last 6 months, most recent discharge 5/11 who presented to [facility] 5/16 with increasing lower extremity edema and shortness of breath. He's found has significant volume overload and admitted for acute heart failure and acute hypoxic respiratory failure.  Patient reports over the last few days she's had increasing lower extremity edema, dyspnea on exertion, orthopnea and shortness of breath. He is seen by home health nurse today with recommendation to come to the emergency department. He reports compliance with his medications although he did not take any this morning as he was feeling poorly. He also reports compliance with diet. Of note has been documented in the past the patient to be noncompliant with medication therapy and diet. He again today states that he believes the torsemide is causing fluid retention.   No alleviating factors, and he has not tried any medications for his symptoms. He also reports associated intermittent, sharp, stabbing, burning, aching, pressure CP described as 5-6/10 level of pain, onset yesterday with each episode lasting approximately half an hour. He also reports a persistent productive cough, headache, abd pain, nausea, and decrease urine output. Reports that he has been trying to eat healthy, and is avoiding salt as best he can.  While in the ED, a CXR showed mild right atelectasis. CT angio was done which showed diffuse patchy ground-glass infiltrates through lungs, right worse than left. Bloodwork showed normal kidney function, BNP 499, troponin was negative. WBC was wnl, and Hgb  was 11.9.     Pertinent labs: BMP and CBC unremarkable. Troponin negative. BNP 499. EKG: Independently reviewed. Atrial fibrillation with rapid ventricular response. Imaging: CT chest diffuse patchy groundglass infiltrates throughout both lungs, right greater than left, suggest acute tibial or edema. Chest x-ray no acute disease.  Review of Systems:  Positive for sore throat and slightly blurry vision.  Positive for muscle aches. Positive for nausea and abd pain. Positive for chest pain, shortness of breath. Negative for fever, rash, dysuria, bleeding, vomiting  Past Medical History  Diagnosis Date  . Hypertensive heart disease     Hypertensive heart disease with prior episodes of CHF  . Hyperlipidemia   . Hypertension   . Gastroesophageal reflux disease   . Osteoarthritis   . Peptic ulcer disease   . Morbid obesity (HCC)     /sleep apnea  . Allergic rhinitis     plus h/o asthma  . Tobacco abuse, in remission     Cigarettes discontinued in 2010  . CHF (congestive heart failure) (HCC)   . A-fib (HCC)     2015  . Asthma   . Noncompliance   . Pulmonary hypertension (HCC) 09/19/2015  . Chronic diastolic CHF (congestive heart failure) [facility])     Past Surgical History  Procedure Laterality Date  . None    . Cardiac catheterization N/A 06/14/2015    Procedure: Right/Left Heart Cath and Coronary Angiography;  Surgeon: Runell Gess, MD;  Location: [facility] INVASIVE CV LAB;  Service: Cardiovascular;  Laterality: N/A;  . Tubal ligation       reports that he quit smoking about 8 years ago. His smoking use included Cigarettes.  He started smoking about 27 years ago. He has a 10 pack-year smoking history. He has never used smokeless tobacco. He reports that he does not drink alcohol or use illicit drugs. Ambulatory status: Ambulates un-aided  Allergies  Allergen Reactions  . Bee Venom Shortness Of Breath and Swelling  . Lipitor [Atorvastatin] Shortness Of Breath and Other (See Comments)      Nose bleed  . Aspirin Other (See Comments)    Chewable 81 mg tablets upsets patients stomach  . Banana Diarrhea  . Diltiazem Other (See Comments)    Makes heart beat fast  . Hydralazine Other (See Comments)    Patient states it causes Tachycardia    Family History  Problem Relation Age of Onset  . Colon cancer Neg Hx   . Inflammatory bowel disease Neg Hx   . Liver disease Neg Hx   . Stroke Father   . Heart attack Father      Prior to Admission medications   Medication Sig Start Date End Date Taking? Authorizing Provider  beclomethasone (QVAR) 80 MCG/ACT inhaler Inhale 2 puffs into the lungs daily as needed (SOB).    Yes Historical Provider, MD  KLOR-CON M20 20 MEQ tablet Take 20 mEq by mouth 2 (two) times daily. 09/04/15  Yes Historical Provider, MD  losartan (COZAAR) 100 MG tablet Take 100 mg by mouth daily.  09/16/15  Yes Historical Provider, MD  metoprolol succinate (TOPROL-XL) 25 MG 24 hr tablet Take 3 tablets (75 mg total) by mouth 2 (two) times daily. Take with or immediately following a meal. 10/31/15  Yes Mauricio Annett Gula, MD  metoprolol succinate (TOPROL-XL) 50 MG 24 hr tablet Take 1 tablet by mouth 2 (two) times daily. 10/03/15  Yes Historical Provider, MD  rivaroxaban (XARELTO) 20 MG TABS tablet Take 1 tablet (20 mg total) by mouth daily with supper. Patient taking differently: Take 20 mg by mouth daily with breakfast.  09/15/15  Yes Estela Isaiah Blakes, MD  spironolactone (ALDACTONE) 25 MG tablet Take 0.5 tablets (12.5 mg total) by mouth daily. Patient taking differently: Take 25 mg by mouth daily.  10/15/15  Yes Estela Isaiah Blakes, MD  torsemide (DEMADEX) 20 MG tablet Take 2 tablets (40 mg total) by mouth 2 (two) times daily. Patient taking differently: Take 20 mg by mouth 2 (two) times daily.  10/15/15  Yes Henderson Cloud, MD    Physical Exam: Filed Vitals:   11/05/15 1430 11/05/15 1530 11/05/15 1557 11/05/15 1600  BP: 157/85 149/117   149/102  Pulse: 66 95  121  Temp:   98.9 F (37.2 C)   TempSrc:   Oral   Resp: 20 23  14   Height:   6' (1.829 m)   Weight:   135.2 kg (298 lb 1 oz)   SpO2: 100% 94%  100%    Constitutional:  . Appears Ill, uncomfortable but not toxic. Volume overloaded and short of breath. Eyes:  . Pupils, irises, lids appear grossly normal. Wear sunglasses. . Normal conjunctivae and lids ENMT:  . external ears, nose appear normal . grossly normal hearing . Lips appear normal . Oropharynx: mucosa, tongue appear grossly normal Neck:  . neck appears normal, no masses, normal ROM . no thyromegaly Respiratory:  . CTA bilaterally, no w/r/r.  . Moderate increased respiratory effort. Able to speak in full sentences. Cardiovascular:  . Tachy, irregular. No m/r/g  . 3+ bilateral lower extremity edema, up to knee, right greater than left. Abdomen:  . Abdomen  appears normal; no tenderness or masses Musculoskeletal:  . RUE, LUE, RLE, LLE   o strength and tone normal, no atrophy, no abnormal movements o No tenderness, masses o Able to stand without assistance Skin:  . No rashes, lesions, ulcers noted No pain with palpation Neurologic:  . Grossly normal Psychiatric:  . judgement and insight appear poor . Mental status o Mood, affect appropriate  Wt Readings from Last 3 Encounters:  11/05/15 135.2 kg (298 lb 1 oz)  10/31/15 130.046 kg (286 lb 11.2 oz)  10/24/15 139.254 kg (307 lb)    I have personally reviewed following labs and imaging studies  Labs on Admission:  CBC:  Recent Labs Lab 10/30/15 0235 10/31/15 0507 11/05/15 1018  WBC 4.7 4.0 4.3  NEUTROABS  --  2.4 2.7  HGB 11.6* 11.2* 11.9*  HCT 37.1* 34.8* 36.8*  MCV 86.1 84.3 86.4  PLT 240 223 263   Basic Metabolic Panel:  Recent Labs Lab 10/30/15 0235 10/31/15 0507 11/05/15 1018  NA 144 141 139  K 3.7 3.5 3.5  CL 106 106 106  CO2 GLUCOSE 107* 97 87  BUN 25* 21* 19  CREATININE 1.42* 1.17 1.00  CALCIUM  8.8* 8.7* 8.8*  MG  --  1.9  --    Liver Function Tests:  Recent Labs Lab 11/05/15 1018  AST 30  ALT 28  ALKPHOS 63  BILITOT 1.6*  PROT 7.1  ALBUMIN 3.9    Recent Labs Lab 11/05/15 1018  LIPASE 22   Cardiac Enzymes:  Recent Labs Lab 11/05/15 1018  TROPONINI 0.03   Urine analysis:    Component Value Date/Time   COLORURINE YELLOW 10/29/2015 1012   APPEARANCEUR CLEAR 10/29/2015 1012   LABSPEC 1.019 10/29/2015 1012   PHURINE 6.0 10/29/2015 1012   GLUCOSEU NEGATIVE 10/29/2015 1012   GLUCOSEU NEG mg/dL 16/03/9603 5409   HGBUR NEGATIVE 10/29/2015 1012   BILIRUBINUR NEGATIVE 10/29/2015 1012   KETONESUR NEGATIVE 10/29/2015 1012   PROTEINUR 30* 10/29/2015 1012   UROBILINOGEN 0.2 08/08/2014 1445   NITRITE NEGATIVE 10/29/2015 1012   LEUKOCYTESUR NEGATIVE 10/29/2015 1012   Sepsis Labs:  Recent Results (from the past 240 hour(s))  Culture, blood (routine x 2)     Status: None   Collection Time: 10/29/15  6:12 AM  Result Value Ref Range Status   Specimen Description BLOOD LEFT ANTECUBITAL  Final   Special Requests BOTTLES DRAWN AEROBIC AND ANAEROBIC 8CC  Final   Culture NO GROWTH 5 DAYS  Final   Report Status 11/03/2015 FINAL  Final  Culture, blood (routine x 2)     Status: None   Collection Time: 10/29/15  6:18 AM  Result Value Ref Range Status   Specimen Description BLOOD RIGHT HAND  Final   Special Requests BOTTLES DRAWN AEROBIC ONLY 9CC  Final   Culture NO GROWTH 5 DAYS  Final   Report Status 11/03/2015 FINAL  Final      Radiological Exams on Admission: Dg Chest 2 View  11/05/2015  CLINICAL DATA:  Shortness of Breath EXAM: CHEST  2 VIEW COMPARISON:  Oct 31, 2015 FINDINGS: There is mild atelectatic change in the right lower lobe region. Lungs elsewhere clear. There is cardiomegaly with pulmonary vascularity within normal limits. No adenopathy. There is mild degenerative change in the thoracic spine. IMPRESSION: Mild right base atelectasis. Lungs elsewhere clear.  Stable cardiomegaly. Electronically Signed   By: Bretta Bang III M.D.   On: 11/05/2015 10:52   Ct Chest Wo  Contrast  11/05/2015  CLINICAL DATA:  Shortness of breath and chest pain for 3 days, initial encounter EXAM: CT CHEST WITHOUT CONTRAST TECHNIQUE: Multidetector CT imaging of the chest was performed following the standard protocol without IV contrast. COMPARISON:  09/19/2015 FINDINGS: The lungs are well aerated bilaterally. Patchy sub solid infiltrates are noted bilaterally worse on the right than the left. No sizable effusion is seen. The thoracic inlet is within normal limits. The thoracic aorta and pulmonary artery as visualized are within normal limits although no contrast was administered. No significant hilar or mediastinal adenopathy is noted at this time. A few small scattered mediastinal lymph nodes are noted which are not significant by size criteria. The visualized upper abdomen is within normal limits. The bony structures show no acute abnormality. IMPRESSION: Diffuse patchy ground-glass infiltrates throughout both lungs worse on the right than the left. Given the patient's clinical history of lower extremity swelling this may represent some acute alveolar edema although a an acute infectious etiology deserves consideration as well. Followup examination is recommended following appropriate therapy. Electronically Signed   By: Alcide Clever M.D.   On: 11/05/2015 13:14    EKG: Independently reviewed. Non-acute  Principal Problem:   Acute on chronic combined systolic and diastolic heart failure (HCC) Active Problems:   OBESITY, MORBID   Noncompliance with medication regimen   Acute respiratory failure with hypoxia (HCC)   Chronic atrial fibrillation (HCC)   OSA (obstructive sleep apnea)   Assessment/Plan 1. Acute on chronic combined CHF. LVEF 20-25 percent with global hypokinesis 4/10. EKG non-acute. Troponin negative. Elevated BNP. Suspect non-compliance as etiology.  2. Acute  hypoxic respiratory failure. Secondary to acute heart failure. 3. A-fib with RVR. Secondary to metastatic survey beta blocker this morning. 4. Chest pain. 2 troponins negative thus far, EKG nonacute. Likely secondary to heart failure. Doubt ACS. 5. Essential HTN. Stable. 6. Non-compliance. 7. OSA.  8. GERD. Continue PPI 9. Morbid obesity   Admit to stepdown, start aggressive IV diuresis, CHF protocol. Administer rate control medication.   Supplemental oxygen  Trend troponins  Continue chronic meds  DVT prophylaxis:Xarelto Code Status: Full Family Communication: no family present at bedside  Consults called: none Admission status: Admit to stepdown.  Time spent: 55 minutes  Brendia Sacks, MD  [facility] Direct contact: [phone] --Via amion app OR  --[email or web address]; password [email or web address]  7PM-7AM contact night coverage as above  11/05/2015, 4:15 PM  By signing my name below, I, Adron Bene, attest that this documentation has been prepared under the direction and in the presence of Kinlee Garrison P. Irene Limbo, MD. Electronically Signed: Adron Bene, Scribe.  11/05/2015 2:15pm  I personally performed the services described in this documentation. All medical record entries made by the scribe were at my direction. I have reviewed the chart and agree that the record reflects my personal performance and is accurate and complete. Brendia Sacks, MD

## 2015-11-05 NOTE — ED Notes (Signed)
EDP at bedside updating patient and family. 

## 2015-11-05 NOTE — ED Notes (Signed)
Pt placed on 2L nasal cannula per MD order.

## 2015-11-05 NOTE — Progress Notes (Signed)
Patient has CPAP ordered but says he does not want to wear one. States he does not wear one at home and would rather just wear his oxygen. RT will continue to monitor.

## 2015-11-05 NOTE — ED Provider Notes (Signed)
CSN: [id number]     Arrival date & time 11/05/15  8295 History  By signing my name below, I, [name], attest that this documentation has been prepared under the direction and in the presence of Vanetta Mulders, MD. Electronically Signed: Angelene Giovanni, ED Scribe. 11/05/2015. 11:03 AM.    Chief Complaint  Patient presents with  . Shortness of Breath   Patient is a 50 y.o. male presenting with shortness of breath. The history is provided by the patient. No language interpreter was used.  Shortness of Breath Severity:  Moderate Onset quality:  Gradual Duration:  2 days Timing:  Constant Progression:  Worsening Chronicity:  Recurrent Relieved by:  None tried Worsened by:  Nothing tried Ineffective treatments:  None tried Associated symptoms: abdominal pain, chest pain, cough and headaches   Associated symptoms: no fever, no rash, no sore throat and no vomiting   Risk factors: obesity    HPI Comments: [name] is a 50 y.o. male with a hx of CHF, A-fib, and pulmonary hypertension who presents to the Emergency Department complaining of gradually worsening constant SOB onset several days ago. He reports associated intermittent CP onset yesterday. He states that he is concerned with fluid build up with bilateral leg swelling. No alleviating factors noted. Pt has not tried any medications for these symptoms PTA. He also reports persistent productive cough, HA, abdominal pain, nausea, and decreased urine. Pt denies any home O2 use. He is currently on Xarelto and pt has an allergy to Aspirin. No fever or chills. Pt was recently admitted on the hospital on 10/29/15.   No PCP, pt is followed by [facility] in [place], [place]   Past Medical History  Diagnosis Date  . Hypertensive heart disease     Hypertensive heart disease with prior episodes of CHF  . Hyperlipidemia   . Hypertension   . Gastroesophageal reflux disease   . Osteoarthritis   . Peptic ulcer disease   . Morbid  obesity (HCC)     /sleep apnea  . Allergic rhinitis     plus h/o asthma  . Tobacco abuse, in remission     Cigarettes discontinued in 2010  . CHF (congestive heart failure) (HCC)   . A-fib (HCC)     2015  . Asthma   . Noncompliance   . Pulmonary hypertension (HCC) 09/19/2015  . Chronic diastolic CHF (congestive heart failure) [facility])    Past Surgical History  Procedure Laterality Date  . None    . Cardiac catheterization N/A 06/14/2015    Procedure: Right/Left Heart Cath and Coronary Angiography;  Surgeon: Runell Gess, MD;  Location: [facility] INVASIVE CV LAB;  Service: Cardiovascular;  Laterality: N/A;  . Tubal ligation     Family History  Problem Relation Age of Onset  . Colon cancer Neg Hx   . Inflammatory bowel disease Neg Hx   . Liver disease Neg Hx   . Stroke Father   . Heart attack Father    Social History  Substance Use Topics  . Smoking status: Former Smoker -- 0.50 packs/day for 20 years    Types: Cigarettes    Start date: 04/26/1988    Quit date: 06/23/2007  . Smokeless tobacco: Never Used     Comment: 1 ppd former smoker  . Alcohol Use: No     Comment: no etoh since 2009    Review of Systems  Constitutional: Negative for fever and chills.  HENT: Positive for congestion. Negative for rhinorrhea and sore throat.  Eyes: Positive for visual disturbance.  Respiratory: Positive for cough and shortness of breath (blurred vision).   Cardiovascular: Positive for chest pain and leg swelling.  Gastrointestinal: Positive for nausea and abdominal pain. Negative for vomiting and diarrhea.  Genitourinary: Negative for dysuria and hematuria.  Musculoskeletal: Positive for back pain.  Skin: Negative for rash.  Neurological: Positive for headaches.  Hematological: Bruises/bleeds easily.  Psychiatric/Behavioral: Negative for confusion.      Allergies  Bee venom; Lipitor; Aspirin; Banana; Diltiazem; and Hydralazine  Home Medications   Prior to Admission medications    Medication Sig Start Date End Date Taking? Authorizing Provider  beclomethasone (QVAR) 80 MCG/ACT inhaler Inhale 2 puffs into the lungs daily as needed (SOB).    Yes Historical Provider, MD  KLOR-CON M20 20 MEQ tablet Take 20 mEq by mouth 2 (two) times daily. 09/04/15  Yes Historical Provider, MD  losartan (COZAAR) 100 MG tablet Take 100 mg by mouth daily.  09/16/15  Yes Historical Provider, MD  metoprolol succinate (TOPROL-XL) 50 MG 24 hr tablet Take 1 tablet by mouth 2 (two) times daily. 10/03/15  Yes Historical Provider, MD  rivaroxaban (XARELTO) 20 MG TABS tablet Take 1 tablet (20 mg total) by mouth daily with supper. Patient taking differently: Take 20 mg by mouth daily with breakfast.  09/15/15  Yes Estela Isaiah Blakes, MD  spironolactone (ALDACTONE) 25 MG tablet Take 0.5 tablets (12.5 mg total) by mouth daily. Patient taking differently: Take 25 mg by mouth daily.  10/15/15  Yes Estela Isaiah Blakes, MD  torsemide (DEMADEX) 20 MG tablet Take 2 tablets (40 mg total) by mouth 2 (two) times daily. Patient taking differently: Take 20 mg by mouth 2 (two) times daily.  10/15/15  Yes Estela Isaiah Blakes, MD  metoprolol succinate (TOPROL-XL) 25 MG 24 hr tablet Take 3 tablets (75 mg total) by mouth 2 (two) times daily. Take with or immediately following a meal. 10/31/15   Mauricio Annett Gula, MD   BP 146/118 mmHg  Pulse 114  Temp(Src) 98.1 F (36.7 C) (Oral)  Resp 28  Ht 6' (1.829 m)  Wt 136.079 kg  BMI 40.68 kg/m2  SpO2 90% Physical Exam  Constitutional: He is oriented to person, place, and time. He appears well-developed and well-nourished.  HENT:  Head: Normocephalic and atraumatic.  Mouth/Throat: Oropharynx is clear and moist.  Eyes: Pupils are equal, round, and reactive to light.  Eyes track normal Sclera normal  Cardiovascular: Normal rate.   Tachycardia  Pulmonary/Chest: Effort normal and breath sounds normal.  Abdominal: Bowel sounds are normal.   Musculoskeletal: He exhibits edema.  Swelling in both legs that goes up to the knees but not to the thighs  Neurological: He is alert and oriented to person, place, and time.  Skin: Skin is warm and dry.  Psychiatric: He has a normal mood and affect.  Nursing note and vitals reviewed.   ED Course  Procedures (including critical care time) DIAGNOSTIC STUDIES: Oxygen Saturation is 95-100% on RA, normal by my interpretation.    COORDINATION OF CARE: 10:59 AM- Pt advised of plan for treatment and pt agrees. Pt will receive chest x-ray.    Labs Review Labs Reviewed  CBC WITH DIFFERENTIAL/PLATELET - Abnormal; Notable for the following:    Hemoglobin 11.9 (*)    HCT 36.8 (*)    All other components within normal limits  COMPREHENSIVE METABOLIC PANEL - Abnormal; Notable for the following:    Calcium 8.8 (*)    Total Bilirubin  1.6 (*)    All other components within normal limits  BRAIN NATRIURETIC PEPTIDE - Abnormal; Notable for the following:    B Natriuretic Peptide 499.0 (*)    All other components within normal limits  TROPONIN I  LIPASE, BLOOD  I-STAT TROPOININ, ED   Results for orders placed or performed during the hospital encounter of 11/05/15  CBC with Differential  Result Value Ref Range   WBC 4.3 4.0 - 10.5 K/uL   RBC 4.26 4.22 - 5.81 MIL/uL   Hemoglobin 11.9 (L) 13.0 - 17.0 g/dL   HCT 16.1 (L) 09.6 - 04.5 %   MCV 86.4 78.0 - 100.0 fL   MCH 27.9 26.0 - 34.0 pg   MCHC 32.3 30.0 - 36.0 g/dL   RDW 40.9 81.1 - 91.4 %   Platelets 263 150 - 400 K/uL   Neutrophils Relative % 64 %   Neutro Abs 2.7 1.7 - 7.7 K/uL   Lymphocytes Relative 24 %   Lymphs Abs 1.0 0.7 - 4.0 K/uL   Monocytes Relative 10 %   Monocytes Absolute 0.4 0.1 - 1.0 K/uL   Eosinophils Relative 2 %   Eosinophils Absolute 0.1 0.0 - 0.7 K/uL   Basophils Relative 0 %   Basophils Absolute 0.0 0.0 - 0.1 K/uL  Comprehensive metabolic panel  Result Value Ref Range   Sodium 139 135 - 145 mmol/L   Potassium  3.5 3.5 - 5.1 mmol/L   Chloride 106 101 - 111 mmol/L   CO2 26 22 - 32 mmol/L   Glucose, Bld 87 65 - 99 mg/dL   BUN 19 6 - 20 mg/dL   Creatinine, Ser 7.82 0.61 - 1.24 mg/dL   Calcium 8.8 (L) 8.9 - 10.3 mg/dL   Total Protein 7.1 6.5 - 8.1 g/dL   Albumin 3.9 3.5 - 5.0 g/dL   AST 30 15 - 41 U/L   ALT 28 17 - 63 U/L   Alkaline Phosphatase 63 38 - 126 U/L   Total Bilirubin 1.6 (H) 0.3 - 1.2 mg/dL   GFR calc non Af Amer >60 >60 mL/min   GFR calc Af Amer >60 >60 mL/min   Anion gap 7 5 - 15  Brain natriuretic peptide  Result Value Ref Range   B Natriuretic Peptide 499.0 (H) 0.0 - 100.0 pg/mL  Troponin I  Result Value Ref Range   Troponin I 0.03 <0.031 ng/mL  Lipase, blood  Result Value Ref Range   Lipase 22 11 - 51 U/L  I-Stat Troponin, ED (not at [facility])  Result Value Ref Range   Troponin i, poc 0.00 0.00 - 0.08 ng/mL   Comment 3             Imaging Review Dg Chest 2 View  11/05/2015  CLINICAL DATA:  Shortness of Breath EXAM: CHEST  2 VIEW COMPARISON:  Oct 31, 2015 FINDINGS: There is mild atelectatic change in the right lower lobe region. Lungs elsewhere clear. There is cardiomegaly with pulmonary vascularity within normal limits. No adenopathy. There is mild degenerative change in the thoracic spine. IMPRESSION: Mild right base atelectasis. Lungs elsewhere clear. Stable cardiomegaly. Electronically Signed   By: Bretta Bang III M.D.   On: 11/05/2015 10:52   Ct Chest Wo Contrast  11/05/2015  CLINICAL DATA:  Shortness of breath and chest pain for 3 days, initial encounter EXAM: CT CHEST WITHOUT CONTRAST TECHNIQUE: Multidetector CT imaging of the chest was performed following the standard protocol without IV contrast. COMPARISON:  09/19/2015 FINDINGS: The  lungs are well aerated bilaterally. Patchy sub solid infiltrates are noted bilaterally worse on the right than the left. No sizable effusion is seen. The thoracic inlet is within normal limits. The thoracic aorta and pulmonary artery as  visualized are within normal limits although no contrast was administered. No significant hilar or mediastinal adenopathy is noted at this time. A few small scattered mediastinal lymph nodes are noted which are not significant by size criteria. The visualized upper abdomen is within normal limits. The bony structures show no acute abnormality. IMPRESSION: Diffuse patchy ground-glass infiltrates throughout both lungs worse on the right than the left. Given the patient's clinical history of lower extremity swelling this may represent some acute alveolar edema although a an acute infectious etiology deserves consideration as well. Followup examination is recommended following appropriate therapy. Electronically Signed   By: Alcide Clever M.D.   On: 11/05/2015 13:14     Vanetta Mulders, MD has personally reviewed and evaluated these images and lab results as part of his medical decision-making.   EKG Interpretation   Date/Time:  Tuesday Nov 05 2015 10:14:03 EDT Ventricular Rate:  120 PR Interval:    QRS Duration: 86 QT Interval:  363 QTC Calculation: 513 R Axis:   72 Text Interpretation:  Atrial fibrillation Paired ventricular premature  complexes Nonspecific T abnormalities, lateral leads Prolonged QT interval  No significant change since last tracing Confirmed by Caisley Baxendale  MD, Jameriah Trotti  ([id number]) on 11/05/2015 10:27:26 AM      MDM   Final diagnoses:  Acute on chronic congestive heart failure, unspecified congestive heart failure type (HCC)  Hypoxia  Atrial fibrillation with RVR (HCC)   She with known history of atrial fibrillation. On blood thinners. Also patient with known history of congestive heart failure. Recent admission May 6 for this. Patient returns today with shortness of breath increased leg swelling. Initial chest x-ray was negative for any evidence of pneumonia or pulmonary edema. However CT chest raises concerns for pulmonary edema. It fits him clinically. Patient's heart rates  been not elevated mid 90s up to 115. Also patient with episodes of hypoxia. Oxygen is gone down to as low as 85 fairly consistently around 8990 on room air. Patient started on 2 L of oxygen. Patient will be given 80 mg of Lasix. Patient's chest pain complaint with 2 negative troponins. Patient's BNP is elevated but not as elevated as it was during the last admission.  Suspect worsening pulmonary edema with congestive heart failure complicated by atrial fibrillation and the elevated heart rate. Also complicated by hypoxia. Patient will require admission. You       I personally performed the services described in this documentation, which was scribed in my presence. The recorded information has been reviewed and is accurate.     Vanetta Mulders, MD 11/05/15 1355

## 2015-11-06 DIAGNOSIS — J9601 Acute respiratory failure with hypoxia: Secondary | ICD-10-CM

## 2015-11-06 LAB — BASIC METABOLIC PANEL
ANION GAP: 6 (ref 5–15)
BUN: 18 mg/dL (ref 6–20)
CHLORIDE: 107 mmol/L (ref 101–111)
CO2: 27 mmol/L (ref 22–32)
Calcium: 8.5 mg/dL — ABNORMAL LOW (ref 8.9–10.3)
Creatinine, Ser: 1.05 mg/dL (ref 0.61–1.24)
Glucose, Bld: 83 mg/dL (ref 65–99)
POTASSIUM: 3.7 mmol/L (ref 3.5–5.1)
SODIUM: 140 mmol/L (ref 135–145)

## 2015-11-06 LAB — TROPONIN I
TROPONIN I: 0.04 ng/mL — AB (ref <0.031)
TROPONIN I: 0.03 ng/mL (ref <0.031)

## 2015-11-06 LAB — MRSA PCR SCREENING: MRSA by PCR: NEGATIVE

## 2015-11-06 NOTE — Progress Notes (Signed)
Patient declining CPAP use.

## 2015-11-06 NOTE — Progress Notes (Signed)
PROGRESS NOTE    [name]  OQH:[id number] DOB: February 08, 1966 DOA: 11/05/2015 PCP: No PCP Per Patient     Brief Narrative:  50 year old man well-known to [place] for frequent hospitalizations for severe systolic CHF with known ejection fraction of 20-25%, atrial fibrillation and most importantly history of noncompliance. Was most recently discharged on 5/11. He states with 100% certainty that he believes that the torsemide is causing fluid retention making me believe that he is probably not taking it at home.   Assessment & Plan:   Principal Problem:   Acute on chronic combined systolic and diastolic heart failure (HCC) Active Problems:   OBESITY, MORBID   Noncompliance with medication regimen   Acute respiratory failure with hypoxia (HCC)   Chronic atrial fibrillation (HCC)   OSA (obstructive sleep apnea)   Acute on chronic combined CHF -He is 5.3 L negative since admission. -Still with significant fluid overload on exam. -Continue Lasix at current dose of 60 mg IV twice a day, wonder if his outpatient diuretic dose needs to be adjusted. -Cardiology consultation has been requested given his recalcitrant CHF and frequent readmissions. -Continue aspirin, Cozaar, metoprolol, spironolactone.  Atrial fibrillation with rapid ventricular response -Right is currently controlled on home dose of metoprolol which we'll continue. -Continue anticoagulation with xarelto.  Acute hypoxemic respiratory failure -Resolved, currently not using oxygen, secondary to acute CHF.  GERD -Continue PPI  Morbid obesity -Noted     DVT prophylaxis: Fully anticoagulated on Xarelto Code Status: Full code Family Communication: Patient only Disposition Plan: Transfer to telemetry  Consultants:   Cardiology, requested  Procedures:   None  Antimicrobials:   None    Subjective: "I feel like crap", he does admit to shortness of breath being improved  Objective: Filed Vitals:   11/06/15  0700 11/06/15 0800 11/06/15 0824 11/06/15 0900  BP: 143/100 134/112 134/112 163/95  Pulse: 87 105 94 50  Temp:      TempSrc:      Resp: 22 24  19   Height:      Weight:      SpO2: 100% 99%  97%    Intake/Output Summary (Last 24 hours) at 11/06/15 1127 Last data filed at 11/06/15 0700  Gross per 24 hour  Intake    130 ml  Output   5450 ml  Net  -5320 ml   Filed Weights   11/05/15 1017 11/05/15 1557 11/06/15 0500  Weight: 136.079 kg (300 lb) 135.2 kg (298 lb 1 oz) 131.1 kg (289 lb 0.4 oz)    Examination:  General exam: Alert, awake, oriented x 3 Respiratory system: Bibasilar crackles Cardiovascular system:RRR. No murmurs, rubs, gallops. Gastrointestinal system: Abdomen is nondistended, soft and nontender. No organomegaly or masses felt. Normal bowel sounds heard. Central nervous system: Alert and oriented. No focal neurological deficits. Extremities: 3+ pitting edema bilaterally Skin: No rashes, lesions or ulcers Psychiatry: Judgement and insight appear normal. Mood & affect appropriate.     Data Reviewed: I have personally reviewed following labs and imaging studies  CBC:  Recent Labs Lab 10/31/15 0507 11/05/15 1018  WBC 4.0 4.3  NEUTROABS 2.4 2.7  HGB 11.2* 11.9*  HCT 34.8* 36.8*  MCV 84.3 86.4  PLT 223 263   Basic Metabolic Panel:  Recent Labs Lab 10/31/15 0507 11/05/15 1018 11/06/15 0618  NA 141 139 140  K 3.5 3.5 3.7  CL 106 106 107  CO2 23 26 27   GLUCOSE 97 87 83  BUN 21* 19 18  CREATININE 1.17 1.00  1.05  CALCIUM 8.7* 8.8* 8.5*  MG 1.9  --   --    GFR: Estimated Creatinine Clearance: 121.8 mL/min (by C-G formula based on Cr of 1.05). Liver Function Tests:  Recent Labs Lab 11/05/15 1018  AST 30  ALT 28  ALKPHOS 63  BILITOT 1.6*  PROT 7.1  ALBUMIN 3.9    Recent Labs Lab 11/05/15 1018  LIPASE 22   No results for input(s): AMMONIA in the last 168 hours. Coagulation Profile: No results for input(s): INR, PROTIME in the last 168  hours. Cardiac Enzymes:  Recent Labs Lab 11/05/15 1018 11/05/15 1943 11/06/15 0036 11/06/15 0618  TROPONINI 0.03 0.03 0.04* 0.03   BNP (last 3 results) No results for input(s): PROBNP in the last 8760 hours. HbA1C: No results for input(s): HGBA1C in the last 72 hours. CBG: No results for input(s): GLUCAP in the last 168 hours. Lipid Profile: No results for input(s): CHOL, HDL, LDLCALC, TRIG, CHOLHDL, LDLDIRECT in the last 72 hours. Thyroid Function Tests: No results for input(s): TSH, T4TOTAL, FREET4, T3FREE, THYROIDAB in the last 72 hours. Anemia Panel: No results for input(s): VITAMINB12, FOLATE, FERRITIN, TIBC, IRON, RETICCTPCT in the last 72 hours. Urine analysis:    Component Value Date/Time   COLORURINE YELLOW 10/29/2015 1012   APPEARANCEUR CLEAR 10/29/2015 1012   LABSPEC 1.019 10/29/2015 1012   PHURINE 6.0 10/29/2015 1012   GLUCOSEU NEGATIVE 10/29/2015 1012   GLUCOSEU NEG mg/dL 16/03/9603 5409   HGBUR NEGATIVE 10/29/2015 1012   BILIRUBINUR NEGATIVE 10/29/2015 1012   KETONESUR NEGATIVE 10/29/2015 1012   PROTEINUR 30* 10/29/2015 1012   UROBILINOGEN 0.2 08/08/2014 1445   NITRITE NEGATIVE 10/29/2015 1012   LEUKOCYTESUR NEGATIVE 10/29/2015 1012   Sepsis Labs: (procalcitonin:4,lacticidven:4)  ) Recent Results (from the past 240 hour(s))  Culture, blood (routine x 2)     Status: None   Collection Time: 10/29/15  6:12 AM  Result Value Ref Range Status   Specimen Description BLOOD LEFT ANTECUBITAL  Final   Special Requests BOTTLES DRAWN AEROBIC AND ANAEROBIC 8CC  Final   Culture NO GROWTH 5 DAYS  Final   Report Status 11/03/2015 FINAL  Final  Culture, blood (routine x 2)     Status: None   Collection Time: 10/29/15  6:18 AM  Result Value Ref Range Status   Specimen Description BLOOD RIGHT HAND  Final   Special Requests BOTTLES DRAWN AEROBIC ONLY 9CC  Final   Culture NO GROWTH 5 DAYS  Final   Report Status 11/03/2015 FINAL  Final  MRSA PCR Screening      Status: None   Collection Time: 11/05/15  3:45 PM  Result Value Ref Range Status   MRSA by PCR NEGATIVE NEGATIVE Final    Comment:        The GeneXpert MRSA Assay (FDA approved for NASAL specimens only), is one component of a comprehensive MRSA colonization surveillance program. It is not intended to diagnose MRSA infection nor to guide or monitor treatment for MRSA infections.          Radiology Studies: Dg Chest 2 View  11/05/2015  CLINICAL DATA:  Shortness of Breath EXAM: CHEST  2 VIEW COMPARISON:  Oct 31, 2015 FINDINGS: There is mild atelectatic change in the right lower lobe region. Lungs elsewhere clear. There is cardiomegaly with pulmonary vascularity within normal limits. No adenopathy. There is mild degenerative change in the thoracic spine. IMPRESSION: Mild right base atelectasis. Lungs elsewhere clear. Stable cardiomegaly. Electronically Signed   By: Bretta Bang  III M.D.   On: 11/05/2015 10:52   Ct Chest Wo Contrast  11/05/2015  CLINICAL DATA:  Shortness of breath and chest pain for 3 days, initial encounter EXAM: CT CHEST WITHOUT CONTRAST TECHNIQUE: Multidetector CT imaging of the chest was performed following the standard protocol without IV contrast. COMPARISON:  09/19/2015 FINDINGS: The lungs are well aerated bilaterally. Patchy sub solid infiltrates are noted bilaterally worse on the right than the left. No sizable effusion is seen. The thoracic inlet is within normal limits. The thoracic aorta and pulmonary artery as visualized are within normal limits although no contrast was administered. No significant hilar or mediastinal adenopathy is noted at this time. A few small scattered mediastinal lymph nodes are noted which are not significant by size criteria. The visualized upper abdomen is within normal limits. The bony structures show no acute abnormality. IMPRESSION: Diffuse patchy ground-glass infiltrates throughout both lungs worse on the right than the left.  Given the patient's clinical history of lower extremity swelling this may represent some acute alveolar edema although a an acute infectious etiology deserves consideration as well. Followup examination is recommended following appropriate therapy. Electronically Signed   By: Alcide Clever M.D.   On: 11/05/2015 13:14        Scheduled Meds: . aspirin EC  81 mg Oral Daily  . furosemide  60 mg Intravenous Q12H  . losartan  100 mg Oral Daily  . metoprolol succinate  75 mg Oral BID  . pneumococcal 23 valent vaccine  0.5 mL Intramuscular Tomorrow-1000  . potassium chloride SA  20 mEq Oral BID  . rivaroxaban  20 mg Oral Q supper  . sodium chloride flush  3 mL Intravenous Q12H  . spironolactone  12.5 mg Oral Daily   Continuous Infusions:    LOS: 1 day    Time spent: 30 minutes. Greater than 50% of this time was spent in direct contact with the patient coordinating care.     Chaya Jan, MD [facility] Pager [phone]  If 7PM-7AM, please contact night-coverage [email or web address] Password [facility] 11/06/2015, 11:27 AM

## 2015-11-07 ENCOUNTER — Encounter: Payer: Medicare Other | Admitting: Adult Health

## 2015-11-07 LAB — CBC
HCT: 37.5 % — ABNORMAL LOW (ref 39.0–52.0)
HEMOGLOBIN: 11.8 g/dL — AB (ref 13.0–17.0)
MCH: 27.6 pg (ref 26.0–34.0)
MCHC: 31.5 g/dL (ref 30.0–36.0)
MCV: 87.6 fL (ref 78.0–100.0)
Platelets: 267 10*3/uL (ref 150–400)
RBC: 4.28 MIL/uL (ref 4.22–5.81)
RDW: 14.9 % (ref 11.5–15.5)
WBC: 4.1 10*3/uL (ref 4.0–10.5)

## 2015-11-07 LAB — BASIC METABOLIC PANEL
ANION GAP: 6 (ref 5–15)
BUN: 23 mg/dL — ABNORMAL HIGH (ref 6–20)
CALCIUM: 8.9 mg/dL (ref 8.9–10.3)
CO2: 28 mmol/L (ref 22–32)
Chloride: 106 mmol/L (ref 101–111)
Creatinine, Ser: 1.17 mg/dL (ref 0.61–1.24)
GFR calc Af Amer: >60 mL/min (ref >60)
GFR calc non Af Amer: >60 mL/min (ref >60)
GLUCOSE: 83 mg/dL (ref 65–99)
Potassium: 3.6 mmol/L (ref 3.5–5.1)
Sodium: 140 mmol/L (ref 135–145)

## 2015-11-07 NOTE — Progress Notes (Signed)
PROGRESS NOTE    [name]  ZOX:[id number] DOB: August 07, 1965 DOA: 11/05/2015 PCP: No PCP Per Patient     Brief Narrative:  50 year old man well-known to [place] for frequent hospitalizations for severe systolic CHF with known ejection fraction of 20-25%, atrial fibrillation and most importantly history of noncompliance. Was most recently discharged on 5/11. He states with 100% certainty that he believes that the torsemide is causing fluid retention making me believe that he is probably not taking it at home.   Assessment & Plan:   Principal Problem:   Acute on chronic combined systolic and diastolic heart failure (HCC) Active Problems:   OBESITY, MORBID   Noncompliance with medication regimen   Acute respiratory failure with hypoxia (HCC)   Chronic atrial fibrillation (HCC)   OSA (obstructive sleep apnea)   Acute on chronic combined CHF -He is 6.8 L negative since admission. -Still with significant fluid overload on exam. -Continue Lasix at current dose of 60 mg IV twice a day, wonder if his outpatient diuretic dose needs to be adjusted. -Continue aspirin, Cozaar, metoprolol, spironolactone.  Atrial fibrillation with rapid ventricular response -Rate is currently controlled on home dose of metoprolol which we will continue. -Continue anticoagulation with xarelto.  Acute hypoxemic respiratory failure -Resolved, currently not using oxygen, secondary to acute CHF.  GERD -Continue PPI  Morbid obesity -Noted     DVT prophylaxis: Fully anticoagulated on Xarelto Code Status: Full code Family Communication: Patient only Disposition Plan: Transfer to telemetry  Consultants:   Cardiology, requested  Procedures:   None  Antimicrobials:   None    Subjective: "I feel like crap", he does admit to shortness of breath being improved  Objective: Filed Vitals:   11/06/15 1200 11/06/15 2028 11/06/15 2139 11/07/15 0500  BP: 123/102  138/97 145/120  Pulse: 81  87 99    Temp: 98.2 F (36.8 C)  99.3 F (37.4 C) 98 F (36.7 C)  TempSrc: Oral  Oral Oral  Resp: Height:      Weight:    132.768 kg (292 lb 11.2 oz)  SpO2: 92% 98% 97% 100%    Intake/Output Summary (Last 24 hours) at 11/07/15 1326 Last data filed at 11/07/15 1058  Gross per 24 hour  Intake    480 ml  Output   2100 ml  Net  -1620 ml   Filed Weights   11/05/15 1557 11/06/15 0500 11/07/15 0500  Weight: 135.2 kg (298 lb 1 oz) 131.1 kg (289 lb 0.4 oz) 132.768 kg (292 lb 11.2 oz)    Examination:  General exam: Alert, awake, oriented x 3 Respiratory system: Bibasilar crackles Cardiovascular system:RRR. No murmurs, rubs, gallops. Gastrointestinal system: Abdomen is nondistended, soft and nontender. No organomegaly or masses felt. Normal bowel sounds heard. Central nervous system: Alert and oriented. No focal neurological deficits. Extremities: 3+ pitting edema bilaterally Skin: No rashes, lesions or ulcers Psychiatry: Judgement and insight appear normal. Mood & affect appropriate.     Data Reviewed: I have personally reviewed following labs and imaging studies  CBC:  Recent Labs Lab 11/05/15 1018 11/07/15 0604  WBC 4.3 4.1  NEUTROABS 2.7  --   HGB 11.9* 11.8*  HCT 36.8* 37.5*  MCV 86.4 87.6  PLT 263 267   Basic Metabolic Panel:  Recent Labs Lab 11/05/15 1018 11/06/15 0618 11/07/15 0604  NA 139 140 140  K 3.5 3.7 3.6  CL 106 107 106  CO2 GLUCOSE 87 83 83  BUN 19 18 23*  CREATININE 1.00 1.05 1.17  CALCIUM 8.8* 8.5* 8.9   GFR: Estimated Creatinine Clearance: 110.1 mL/min (by C-G formula based on Cr of 1.17). Liver Function Tests:  Recent Labs Lab 11/05/15 1018  AST 30  ALT 28  ALKPHOS 63  BILITOT 1.6*  PROT 7.1  ALBUMIN 3.9    Recent Labs Lab 11/05/15 1018  LIPASE 22   No results for input(s): AMMONIA in the last 168 hours. Coagulation Profile: No results for input(s): INR, PROTIME in the last 168 hours. Cardiac  Enzymes:  Recent Labs Lab 11/05/15 1018 11/05/15 1943 11/06/15 0036 11/06/15 0618  TROPONINI 0.03 0.03 0.04* 0.03   BNP (last 3 results) No results for input(s): PROBNP in the last 8760 hours. HbA1C: No results for input(s): HGBA1C in the last 72 hours. CBG: No results for input(s): GLUCAP in the last 168 hours. Lipid Profile: No results for input(s): CHOL, HDL, LDLCALC, TRIG, CHOLHDL, LDLDIRECT in the last 72 hours. Thyroid Function Tests: No results for input(s): TSH, T4TOTAL, FREET4, T3FREE, THYROIDAB in the last 72 hours. Anemia Panel: No results for input(s): VITAMINB12, FOLATE, FERRITIN, TIBC, IRON, RETICCTPCT in the last 72 hours. Urine analysis:    Component Value Date/Time   COLORURINE YELLOW 10/29/2015 1012   APPEARANCEUR CLEAR 10/29/2015 1012   LABSPEC 1.019 10/29/2015 1012   PHURINE 6.0 10/29/2015 1012   GLUCOSEU NEGATIVE 10/29/2015 1012   GLUCOSEU NEG mg/dL 16/03/9603 5409   HGBUR NEGATIVE 10/29/2015 1012   BILIRUBINUR NEGATIVE 10/29/2015 1012   KETONESUR NEGATIVE 10/29/2015 1012   PROTEINUR 30* 10/29/2015 1012   UROBILINOGEN 0.2 08/08/2014 1445   NITRITE NEGATIVE 10/29/2015 1012   LEUKOCYTESUR NEGATIVE 10/29/2015 1012   Sepsis Labs: @LABRCNTIP (procalcitonin:4,lacticidven:4)  ) Recent Results (from the past 240 hour(s))  Culture, blood (routine x 2)     Status: None   Collection Time: 10/29/15  6:12 AM  Result Value Ref Range Status   Specimen Description BLOOD LEFT ANTECUBITAL  Final   Special Requests BOTTLES DRAWN AEROBIC AND ANAEROBIC 8CC  Final   Culture NO GROWTH 5 DAYS  Final   Report Status 11/03/2015 FINAL  Final  Culture, blood (routine x 2)     Status: None   Collection Time: 10/29/15  6:18 AM  Result Value Ref Range Status   Specimen Description BLOOD RIGHT HAND  Final   Special Requests BOTTLES DRAWN AEROBIC ONLY 9CC  Final   Culture NO GROWTH 5 DAYS  Final   Report Status 11/03/2015 FINAL  Final  MRSA PCR Screening     Status: None    Collection Time: 11/05/15  3:45 PM  Result Value Ref Range Status   MRSA by PCR NEGATIVE NEGATIVE Final    Comment:        The GeneXpert MRSA Assay (FDA approved for NASAL specimens only), is one component of a comprehensive MRSA colonization surveillance program. It is not intended to diagnose MRSA infection nor to guide or monitor treatment for MRSA infections.          Radiology Studies: No results found.      Scheduled Meds: . aspirin EC  81 mg Oral Daily  . furosemide  60 mg Intravenous Q12H  . losartan  100 mg Oral Daily  . metoprolol succinate  75 mg Oral BID  . pneumococcal 23 valent vaccine  0.5 mL Intramuscular Tomorrow-1000  . potassium chloride SA  20 mEq Oral BID  . rivaroxaban  20 mg Oral Q supper  . sodium chloride  flush  3 mL Intravenous Q12H  . spironolactone  12.5 mg Oral Daily   Continuous Infusions:    LOS: 2 days    Time spent: 25 minutes. Greater than 50% of this time was spent in direct contact with the patient coordinating care.     Chaya Jan, MD [facility] Pager [phone]  If 7PM-7AM, please contact night-coverage [email or web address] Password [email or web address] 11/07/2015, 1:26 PM

## 2015-11-08 LAB — BASIC METABOLIC PANEL
Anion gap: 7 (ref 5–15)
BUN: 27 mg/dL — AB (ref 6–20)
CHLORIDE: 105 mmol/L (ref 101–111)
CO2: 29 mmol/L (ref 22–32)
Calcium: 9.1 mg/dL (ref 8.9–10.3)
Creatinine, Ser: 1.25 mg/dL — ABNORMAL HIGH (ref 0.61–1.24)
GFR calc non Af Amer: >60 mL/min (ref >60)
Glucose, Bld: 88 mg/dL (ref 65–99)
POTASSIUM: 4 mmol/L (ref 3.5–5.1)
SODIUM: 141 mmol/L (ref 135–145)

## 2015-11-08 NOTE — Care Management Note (Signed)
Case Management Note  Patient Details  Name: [name] MRN: [id number] Date of Birth: May 25, 1966  Subjective/Objective:Pt is from home with family. He is active with [facility] and is a [facility] patient also. Pt is noncompliant with medications. Patient has been discharged cardiology services. Patient was no show to PCP appointment made during previous admission. MD office will given patient another chance,  appointment made.                Action/Plan: Anticipate DC home with resumption of services with [facility]. Alroy Bailiff of [facility] is aware of admission and will obtain patient information from chart. Patient  will also be referred to [facility] Care program.    Expected Discharge Date:        11/09/2015          Expected Discharge Plan:  Home w Home Health Services  In-House Referral:  NA  Discharge planning Services  CM Consult  Post Acute Care Choice:  Home Health, Resumption of Svcs/PTA Provider Choice offered to:  Patient  DME Arranged:    DME Agency:     HH Arranged:  RN HH Agency:  [facility]  Status of Service:  In process, will continue to follow  Medicare Important Message Given:  Yes Date Medicare IM Given:    Medicare IM give by:    Date Additional Medicare IM Given:    Additional Medicare Important Message give by:     If discussed at Long Length of Stay Meetings, dates discussed:    Additional Comments:  [place]ia [place], [name], RN 11/08/2015, 3:46 PM

## 2015-11-08 NOTE — Care Management Important Message (Signed)
Important Message  Patient Details  Name: [name] MRN: [id number] Date of Birth: [birth year]   Medicare Important Message Given:  Yes    [name], [name], RN 11/08/2015, 9:24 AM

## 2015-11-08 NOTE — Progress Notes (Signed)
PROGRESS NOTE    [name]  ZOX:[id number] DOB: 05-18-1966 DOA: 11/05/2015 PCP: No PCP Per Patient     Brief Narrative:  50 year old man well-known to [place] for frequent hospitalizations for severe systolic CHF with known ejection fraction of 20-25%, atrial fibrillation and most importantly history of noncompliance. Was most recently discharged on 5/11. He states with 100% certainty that he believes that the torsemide is causing fluid retention making me believe that he is probably not taking it at home.   Assessment & Plan:   Principal Problem:   Acute on chronic combined systolic and diastolic heart failure (HCC) Active Problems:   OBESITY, MORBID   Noncompliance with medication regimen   Acute respiratory failure with hypoxia (HCC)   Chronic atrial fibrillation (HCC)   OSA (obstructive sleep apnea)   Acute on chronic combined CHF -He is 8.2 L negative since admission. -Still with significant fluid overload on exam. -Continue Lasix at current dose of 60 mg IV twice a day. -Continue aspirin, Cozaar, metoprolol, spironolactone. -Noncompliance with diet and medications is his main issue. Has already been dismissed from cardiology practice.  Atrial fibrillation with rapid ventricular response -Rate is currently controlled on home dose of metoprolol which we will continue. -Continue anticoagulation with xarelto.  Acute hypoxemic respiratory failure -Resolved, currently not using oxygen, secondary to acute CHF.  GERD -Continue PPI  Morbid obesity -Noted     DVT prophylaxis: Fully anticoagulated on Xarelto Code Status: Full code Family Communication: Patient only Disposition Plan: Hope for discharge home in approximately 48 hours.  Consultants:   Cardiology, requested  Procedures:   None  Antimicrobials:   None    Subjective: "I feel like crap", he does admit to shortness of breath being improved  Objective: Filed Vitals:   11/07/15 2032 11/08/15  0614 11/08/15 1023 11/08/15 1513  BP:  151/114 134/93 124/87  Pulse:  85 88 84  Temp:  98.6 F (37 C)  98.4 F (36.9 C)  TempSrc:  Oral  Oral  Resp:  20  18  Height:      Weight:  129.774 kg (286 lb 1.6 oz)    SpO2: 100% 91% 96% 97%    Intake/Output Summary (Last 24 hours) at 11/08/15 1641 Last data filed at 11/08/15 1200  Gross per 24 hour  Intake    480 ml  Output   2100 ml  Net  -1620 ml   Filed Weights   11/06/15 0500 11/07/15 0500 11/08/15 0614  Weight: 131.1 kg (289 lb 0.4 oz) 132.768 kg (292 lb 11.2 oz) 129.774 kg (286 lb 1.6 oz)    Examination:  General exam: Alert, awake, oriented x 3 Respiratory system: Bibasilar crackles Cardiovascular system:RRR. No murmurs, rubs, gallops. Gastrointestinal system: Abdomen is nondistended, soft and nontender. No organomegaly or masses felt. Normal bowel sounds heard. Central nervous system: Alert and oriented. No focal neurological deficits. Extremities: 3+ pitting edema bilaterally Skin: No rashes, lesions or ulcers Psychiatry: Judgement and insight appear normal. Mood & affect appropriate.     Data Reviewed: I have personally reviewed following labs and imaging studies  CBC:  Recent Labs Lab 11/05/15 1018 11/07/15 0604  WBC 4.3 4.1  NEUTROABS 2.7  --   HGB 11.9* 11.8*  HCT 36.8* 37.5*  MCV 86.4 87.6  PLT 263 267   Basic Metabolic Panel:  Recent Labs Lab 11/05/15 1018 11/06/15 0618 11/07/15 0604 11/08/15 0640  NA 139 140 140 141  K 3.5 3.7 3.6 4.0  CL 106  107 106 105  CO2 26 27 28 29   GLUCOSE 87 83 83 88  BUN 19 18 23* 27*  CREATININE 1.00 1.05 1.17 1.25*  CALCIUM 8.8* 8.5* 8.9 9.1   GFR: Estimated Creatinine Clearance: 101.8 mL/min (by C-G formula based on Cr of 1.25). Liver Function Tests:  Recent Labs Lab 11/05/15 1018  AST 30  ALT 28  ALKPHOS 63  BILITOT 1.6*  PROT 7.1  ALBUMIN 3.9    Recent Labs Lab 11/05/15 1018  LIPASE 22   No results for input(s): AMMONIA in the last 168  hours. Coagulation Profile: No results for input(s): INR, PROTIME in the last 168 hours. Cardiac Enzymes:  Recent Labs Lab 11/05/15 1018 11/05/15 1943 11/06/15 0036 11/06/15 0618  TROPONINI 0.03 0.03 0.04* 0.03   BNP (last 3 results) No results for input(s): PROBNP in the last 8760 hours. HbA1C: No results for input(s): HGBA1C in the last 72 hours. CBG: No results for input(s): GLUCAP in the last 168 hours. Lipid Profile: No results for input(s): CHOL, HDL, LDLCALC, TRIG, CHOLHDL, LDLDIRECT in the last 72 hours. Thyroid Function Tests: No results for input(s): TSH, T4TOTAL, FREET4, T3FREE, THYROIDAB in the last 72 hours. Anemia Panel: No results for input(s): VITAMINB12, FOLATE, FERRITIN, TIBC, IRON, RETICCTPCT in the last 72 hours. Urine analysis:    Component Value Date/Time   COLORURINE YELLOW 10/29/2015 1012   APPEARANCEUR CLEAR 10/29/2015 1012   LABSPEC 1.019 10/29/2015 1012   PHURINE 6.0 10/29/2015 1012   GLUCOSEU NEGATIVE 10/29/2015 1012   GLUCOSEU NEG mg/dL 09/32/3557 3220   HGBUR NEGATIVE 10/29/2015 1012   BILIRUBINUR NEGATIVE 10/29/2015 1012   KETONESUR NEGATIVE 10/29/2015 1012   PROTEINUR 30* 10/29/2015 1012   UROBILINOGEN 0.2 08/08/2014 1445   NITRITE NEGATIVE 10/29/2015 1012   LEUKOCYTESUR NEGATIVE 10/29/2015 1012   Sepsis Labs: @LABRCNTIP (procalcitonin:4,lacticidven:4)  ) Recent Results (from the past 240 hour(s))  MRSA PCR Screening     Status: None   Collection Time: 11/05/15  3:45 PM  Result Value Ref Range Status   MRSA by PCR NEGATIVE NEGATIVE Final    Comment:        The GeneXpert MRSA Assay (FDA approved for NASAL specimens only), is one component of a comprehensive MRSA colonization surveillance program. It is not intended to diagnose MRSA infection nor to guide or monitor treatment for MRSA infections.          Radiology Studies: No results found.      Scheduled Meds: . aspirin EC  81 mg Oral Daily  . furosemide  60  mg Intravenous Q12H  . losartan  100 mg Oral Daily  . metoprolol succinate  75 mg Oral BID  . pneumococcal 23 valent vaccine  0.5 mL Intramuscular Tomorrow-1000  . potassium chloride SA  20 mEq Oral BID  . rivaroxaban  20 mg Oral Q supper  . sodium chloride flush  3 mL Intravenous Q12H  . spironolactone  12.5 mg Oral Daily   Continuous Infusions:    LOS: 3 days    Time spent: 25 minutes. Greater than 50% of this time was spent in direct contact with the patient coordinating care.     Chaya Jan, MD [facility] Pager [phone]  If 7PM-7AM, please contact night-coverage [email or web address] Password [facility] 11/08/2015, 4:41 PM

## 2015-11-09 DIAGNOSIS — I482 Chronic atrial fibrillation: Secondary | ICD-10-CM

## 2015-11-09 DIAGNOSIS — Z9114 Patient's other noncompliance with medication regimen: Secondary | ICD-10-CM

## 2015-11-09 DIAGNOSIS — I5043 Acute on chronic combined systolic (congestive) and diastolic (congestive) heart failure: Secondary | ICD-10-CM

## 2015-11-09 DIAGNOSIS — G4733 Obstructive sleep apnea (adult) (pediatric): Secondary | ICD-10-CM

## 2015-11-09 NOTE — Progress Notes (Signed)
Pt discharged to home via private vehicle in the care of a friend. VS: WNL.  PIV removed intact.  Extended pressure applied for 7/8 minutes d/t Xarelto.  Pt discharged in stable condition.  Discharge instructions reviewed with pt.  Pt verbalized understanding.  Pt refused wheelchair.

## 2015-11-09 NOTE — Discharge Summary (Signed)
Physician Discharge Summary  [name]:[id number] DOB: 1966-04-23 DOA: 11/05/2015  PCP: No PCP Per Patient  Admit date: 11/05/2015 Discharge date: 11/09/2015  Time spent: 35 minutes  Recommendations for Outpatient Follow-up:  1. Follow up with PCP next week. 2. Follow up with CHF outpatient clinic strongly recommended.     Discharge Diagnoses:  Principal Problem:   Acute on chronic combined systolic and diastolic heart failure (HCC) Active Problems:   OBESITY, MORBID   Noncompliance with medication regimen   Acute respiratory failure with hypoxia (HCC)   Chronic atrial fibrillation (HCC)   OSA (obstructive sleep apnea)   Discharge Condition: improved.  Not in CHF.   Diet recommendation: Low salt cardiac diet.   Filed Weights   11/07/15 0500 11/08/15 0614 11/09/15 0700  Weight: 132.768 kg (292 lb 11.2 oz) 129.774 kg (286 lb 1.6 oz) 127.461 kg (281 lb)    History of present illness: Patient was admitted by Dr Irene Limbo on Nov 05, 2015 for shortness of breath and lower extremity edema.  As per his H and P:  " 50 yom with a hx of combined CHF with an EF 20-25%, atrial fibrillation on Xarelto, noncompliance and pulmonary hypertension with 8 admissions in the last 6 months, most recent discharge 5/11 who presented to [facility] 5/16 with increasing lower extremity edema and shortness of breath. He's found has significant volume overload and admitted for acute heart failure and acute hypoxic respiratory failure.  Patient reports over the last few days she's had increasing lower extremity edema, dyspnea on exertion, orthopnea and shortness of breath. He is seen by home health nurse today with recommendation to come to the emergency department. He reports compliance with his medications although he did not take any this morning as he was feeling poorly. He also reports compliance with diet. Of note has been documented in the past the patient to be noncompliant with medication therapy  and diet. He again today states that he believes the torsemide is causing fluid retention.   No alleviating factors, and he has not tried any medications for his symptoms. He also reports associated intermittent, sharp, stabbing, burning, aching, pressure CP described as 5-6/10 level of pain, onset yesterday with each episode lasting approximately half an hour. He also reports a persistent productive cough, headache, abd pain, nausea, and decrease urine output. Reports that he has been trying to eat healthy, and is avoiding salt as best he can.  While in the ED, a CXR showed mild right atelectasis. CT angio was done which showed diffuse patchy ground-glass infiltrates through lungs, right worse than left. Bloodwork showed normal kidney function, BNP 499, troponin was negative. WBC was wnl, and Hgb was 11.9.    Hospital Course:  50 year old man well-known to [place] for frequent hospitalizations for severe systolic CHF with known ejection fraction of 20-25%, atrial fibrillation and most importantly history of noncompliance. Was most recently discharged on 5/11. He states with 100% certainty that he believes that the torsemide is causing fluid retention which suggests that he is probably not taking it at home.  He was diuresed, and felt markedly better.  His lower extremity edema resolved.  His Cr remained low at 1.25.  He felt well and is anxious to go home.  He is stable for discharged, and unfortunately, due to his noncompliance, he was discharged from the cardiology group in [place].  I don't know whether or not he will be accepted into the CHF clinic in [place], but will try to  see.  Otherwise, he will see his PCP in follow up next week, and a referral to another group of cardiology will have to be arranged.  Thank you and Good Day.    Consultations:  None.   Discharge Exam: Filed Vitals:   11/09/15 0530 11/09/15 1307  BP: 142/104 125/102  Pulse: 84 79  Temp: 98.2 F (36.8 C) 97.8 F (36.6 C)   Resp: 18 18     Discharge Instructions   Discharge Instructions    AMB Referral to HF Clinic    Complete by:  As directed      Diet - low sodium heart healthy    Complete by:  As directed      Discharge instructions    Complete by:  As directed   Follow up with PCP in one week.     Increase activity slowly    Complete by:  As directed           Current Discharge Medication List    CONTINUE these medications which have NOT CHANGED   Details  beclomethasone (QVAR) 80 MCG/ACT inhaler Inhale 2 puffs into the lungs daily as needed (SOB).     KLOR-CON M20 20 MEQ tablet Take 20 mEq by mouth 2 (two) times daily. Refills: 0    losartan (COZAAR) 100 MG tablet Take 100 mg by mouth daily.  Refills: 1    metoprolol succinate (TOPROL-XL) 25 MG 24 hr tablet Take 3 tablets (75 mg total) by mouth 2 (two) times daily. Take with or immediately following a meal. Qty: 60 tablet, Refills: 0    rivaroxaban (XARELTO) 20 MG TABS tablet Take 1 tablet (20 mg total) by mouth daily with supper. Qty: 30 tablet, Refills: 2    spironolactone (ALDACTONE) 25 MG tablet Take 0.5 tablets (12.5 mg total) by mouth daily. Qty: 30 tablet, Refills: 0    torsemide (DEMADEX) 20 MG tablet Take 2 tablets (40 mg total) by mouth 2 (two) times daily. Qty: 60 tablet, Refills: 2       Allergies  Allergen Reactions  . Bee Venom Shortness Of Breath and Swelling  . Lipitor [Atorvastatin] Shortness Of Breath and Other (See Comments)    Nose bleed  . Aspirin Other (See Comments)    Chewable 81 mg tablets upsets patients stomach  . Banana Diarrhea  . Diltiazem Other (See Comments)    Makes heart beat fast  . Hydralazine Other (See Comments)    Patient states it causes Tachycardia   Follow-up Information    Follow up with Pearson Grippe, MD On 11/13/2015.   Specialty:  Internal Medicine   Why:  at 11:30   Contact information:   [address] [place] [address] [phone]        The results of  significant diagnostics from this hospitalization (including imaging, microbiology, ancillary and laboratory) are listed below for reference.    Significant Diagnostic Studies: Dg Chest 2 View  11/05/2015  CLINICAL DATA:  Shortness of Breath EXAM: CHEST  2 VIEW COMPARISON:  Oct 31, 2015 FINDINGS: There is mild atelectatic change in the right lower lobe region. Lungs elsewhere clear. There is cardiomegaly with pulmonary vascularity within normal limits. No adenopathy. There is mild degenerative change in the thoracic spine. IMPRESSION: Mild right base atelectasis. Lungs elsewhere clear. Stable cardiomegaly. Electronically Signed   By: Bretta Bang III M.D.   On: 11/05/2015 10:52   Dg Chest 2 View  10/31/2015  CLINICAL DATA:  Lower extremity swelling, recent pneumonia  EXAM: CHEST  2 VIEW COMPARISON:  10/29/2015 FINDINGS: Soft tissues overlying the right lower hemithorax. No frank interstitial edema, improved. No focal consolidation. No definite pleural effusion on the lateral view. Cardiomegaly. IMPRESSION: No frank interstitial edema, improved. No evidence of acute cardiopulmonary disease. Electronically Signed   By: Charline Bills M.D.   On: 10/31/2015 08:41   Dg Chest 2 View  10/26/2015  CLINICAL DATA:  Patient with leg swelling and fluid retention. EXAM: CHEST  2 VIEW COMPARISON:  Chest radiograph 10/24/2015 FINDINGS: Stable cardiomegaly. Pulmonary vascular redistribution and mild interstitial pulmonary opacities bilaterally. No definite pleural effusion. Thoracic spine degenerative changes. Lateral view limited due to overlapping soft tissue. IMPRESSION: Cardiomegaly, pulmonary vascular redistribution and mild interstitial edema. Electronically Signed   By: Annia Belt M.D.   On: 10/26/2015 12:15   Dg Chest 2 View  10/24/2015  CLINICAL DATA:  Shortness of Breath EXAM: CHEST  2 VIEW COMPARISON:  10/22/2015 FINDINGS: The cardiac shadow is enlarged but stable. The lungs are well aerated  bilaterally. No focal infiltrate or sizable effusion is seen. No bony abnormality is noted. IMPRESSION: No active cardiopulmonary disease. Electronically Signed   By: Alcide Clever M.D.   On: 10/24/2015 10:09   Ct Chest Wo Contrast  11/05/2015  CLINICAL DATA:  Shortness of breath and chest pain for 3 days, initial encounter EXAM: CT CHEST WITHOUT CONTRAST TECHNIQUE: Multidetector CT imaging of the chest was performed following the standard protocol without IV contrast. COMPARISON:  09/19/2015 FINDINGS: The lungs are well aerated bilaterally. Patchy sub solid infiltrates are noted bilaterally worse on the right than the left. No sizable effusion is seen. The thoracic inlet is within normal limits. The thoracic aorta and pulmonary artery as visualized are within normal limits although no contrast was administered. No significant hilar or mediastinal adenopathy is noted at this time. A few small scattered mediastinal lymph nodes are noted which are not significant by size criteria. The visualized upper abdomen is within normal limits. The bony structures show no acute abnormality. IMPRESSION: Diffuse patchy ground-glass infiltrates throughout both lungs worse on the right than the left. Given the patient's clinical history of lower extremity swelling this may represent some acute alveolar edema although a an acute infectious etiology deserves consideration as well. Followup examination is recommended following appropriate therapy. Electronically Signed   By: Alcide Clever M.D.   On: 11/05/2015 13:14   Dg Chest Port 1 View  10/29/2015  CLINICAL DATA:  Fever and weakness.  Dyspnea tonight. EXAM: PORTABLE CHEST 1 VIEW COMPARISON:  10/26/2015 FINDINGS: There is moderate cardiomegaly, unchanged. Patchy consolidation in the right upper lobe and right lower lobe may represent pneumonia or asymmetric alveolar edema. No large effusions. IMPRESSION: New patchy airspace opacity in the right lung, consistent with pneumonia or  asymmetric alveolar edema. Electronically Signed   By: Ellery Plunk M.D.   On: 10/29/2015 05:29   Dg Chest Portable 1 View  10/22/2015  CLINICAL DATA:  Two day history of shortness of breath EXAM: PORTABLE CHEST 1 VIEW COMPARISON:  October 07, 2015 FINDINGS: There is no edema or consolidation. There is generalized cardiac enlargement with pulmonary vascularity within normal limits. No adenopathy. No bone lesions. IMPRESSION: Stable generalized cardiomegaly.  No edema or consolidation. Electronically Signed   By: Bretta Bang III M.D.   On: 10/22/2015 10:57    Microbiology: Recent Results (from the past 240 hour(s))  MRSA PCR Screening     Status: None   Collection Time: 11/05/15  3:45  PM  Result Value Ref Range Status   MRSA by PCR NEGATIVE NEGATIVE Final    Comment:        The GeneXpert MRSA Assay (FDA approved for NASAL specimens only), is one component of a comprehensive MRSA colonization surveillance program. It is not intended to diagnose MRSA infection nor to guide or monitor treatment for MRSA infections.      Labs: Basic Metabolic Panel:  Recent Labs Lab 11/05/15 1018 11/06/15 0618 11/07/15 0604 11/08/15 0640  NA 139 140 140 141  K 3.5 3.7 3.6 4.0  CL 106 107 106 105  CO2 26 27 28 29   GLUCOSE 87 83 83 88  BUN 19 18 23* 27*  CREATININE 1.00 1.05 1.17 1.25*  CALCIUM 8.8* 8.5* 8.9 9.1   Liver Function Tests:  Recent Labs Lab 11/05/15 1018  AST 30  ALT 28  ALKPHOS 63  BILITOT 1.6*  PROT 7.1  ALBUMIN 3.9    Recent Labs Lab 11/05/15 1018  LIPASE 22   CBC:  Recent Labs Lab 11/05/15 1018 11/07/15 0604  WBC 4.3 4.1  NEUTROABS 2.7  --   HGB 11.9* 11.8*  HCT 36.8* 37.5*  MCV 86.4 87.6  PLT 263 267   Cardiac Enzymes:  Recent Labs Lab 11/05/15 1018 11/05/15 1943 11/06/15 0036 11/06/15 0618  TROPONINI 0.03 0.03 0.04* 0.03   BNP: BNP (last 3 results)  Recent Labs  10/24/15 1002 10/26/15 1135 11/05/15 1018  BNP 640.0* 659.5*  499.0*    Signed:  Ieesha Abbasi MD. Jerrel Ivory.  [facility] 11/09/2015, 4:27 PM

## 2015-11-09 NOTE — Progress Notes (Signed)
Pt discharged via wheelchair into care of wife via private vehicle.  VS WNL.  No c/o pain.  Port deaccessed w/o complications.  Discharge instructions reviewed with pt/wife.  Pt/wife verbalized understanding.

## 2015-11-11 ENCOUNTER — Telehealth: Payer: Self-pay | Admitting: Cardiovascular Disease

## 2015-11-11 NOTE — Telephone Encounter (Signed)
Patient's BP is 158/102 but patient is refusing to take BP medications. / tg

## 2015-11-11 NOTE — Telephone Encounter (Signed)
LM to call back-cc

## 2015-11-11 NOTE — Telephone Encounter (Signed)
I spoke with [facility] Marisue Ivan and I informed her I would let our Engineer, manufacturing know

## 2015-11-17 ENCOUNTER — Inpatient Hospital Stay (HOSPITAL_COMMUNITY)
Admission: EM | Admit: 2015-11-17 | Discharge: 2015-11-21 | DRG: 293 | Disposition: A | Discharge status: Home Health | Payer: Medicare Other | Attending: Internal Medicine | Admitting: Internal Medicine

## 2015-11-17 ENCOUNTER — Emergency Department (HOSPITAL_COMMUNITY): Payer: Medicare Other

## 2015-11-17 ENCOUNTER — Encounter (HOSPITAL_COMMUNITY): Payer: Self-pay | Admitting: *Deleted

## 2015-11-17 DIAGNOSIS — I110 Hypertensive heart disease with heart failure: Principal | ICD-10-CM | POA: Diagnosis present

## 2015-11-17 DIAGNOSIS — I10 Essential (primary) hypertension: Secondary | ICD-10-CM | POA: Diagnosis not present

## 2015-11-17 DIAGNOSIS — R600 Localized edema: Secondary | ICD-10-CM | POA: Diagnosis not present

## 2015-11-17 DIAGNOSIS — K219 Gastro-esophageal reflux disease without esophagitis: Secondary | ICD-10-CM | POA: Diagnosis present

## 2015-11-17 DIAGNOSIS — Z7901 Long term (current) use of anticoagulants: Secondary | ICD-10-CM

## 2015-11-17 DIAGNOSIS — R0602 Shortness of breath: Secondary | ICD-10-CM | POA: Diagnosis not present

## 2015-11-17 DIAGNOSIS — I5031 Acute diastolic (congestive) heart failure: Secondary | ICD-10-CM | POA: Diagnosis present

## 2015-11-17 DIAGNOSIS — I5043 Acute on chronic combined systolic (congestive) and diastolic (congestive) heart failure: Secondary | ICD-10-CM

## 2015-11-17 DIAGNOSIS — Z9119 Patient's noncompliance with other medical treatment and regimen: Secondary | ICD-10-CM

## 2015-11-17 DIAGNOSIS — E785 Hyperlipidemia, unspecified: Secondary | ICD-10-CM | POA: Diagnosis present

## 2015-11-17 DIAGNOSIS — I509 Heart failure, unspecified: Secondary | ICD-10-CM

## 2015-11-17 DIAGNOSIS — I482 Chronic atrial fibrillation, unspecified: Secondary | ICD-10-CM | POA: Diagnosis present

## 2015-11-17 DIAGNOSIS — Z8701 Personal history of pneumonia (recurrent): Secondary | ICD-10-CM

## 2015-11-17 DIAGNOSIS — Z87891 Personal history of nicotine dependence: Secondary | ICD-10-CM

## 2015-11-17 DIAGNOSIS — G4733 Obstructive sleep apnea (adult) (pediatric): Secondary | ICD-10-CM | POA: Diagnosis present

## 2015-11-17 LAB — CBC WITH DIFFERENTIAL/PLATELET
BASOS ABS: 0 10*3/uL (ref 0.0–0.1)
Basophils Relative: 0 %
EOS PCT: 2 %
Eosinophils Absolute: 0.1 10*3/uL (ref 0.0–0.7)
HCT: 33.5 % — ABNORMAL LOW (ref 39.0–52.0)
Hemoglobin: 10.8 g/dL — ABNORMAL LOW (ref 13.0–17.0)
LYMPHS PCT: 27 %
Lymphs Abs: 1.1 10*3/uL (ref 0.7–4.0)
MCH: 27.3 pg (ref 26.0–34.0)
MCHC: 32.2 g/dL (ref 30.0–36.0)
MCV: 84.8 fL (ref 78.0–100.0)
MONO ABS: 0.3 10*3/uL (ref 0.1–1.0)
Monocytes Relative: 7 %
Neutro Abs: 2.6 10*3/uL (ref 1.7–7.7)
Neutrophils Relative %: 64 %
PLATELETS: 203 10*3/uL (ref 150–400)
RBC: 3.95 MIL/uL — ABNORMAL LOW (ref 4.22–5.81)
RDW: 15 % (ref 11.5–15.5)
WBC: 4 10*3/uL (ref 4.0–10.5)

## 2015-11-17 LAB — BASIC METABOLIC PANEL
ANION GAP: 7 (ref 5–15)
BUN: 22 mg/dL — AB (ref 6–20)
CALCIUM: 8.5 mg/dL — AB (ref 8.9–10.3)
CO2: 22 mmol/L (ref 22–32)
Chloride: 107 mmol/L (ref 101–111)
Creatinine, Ser: 1.05 mg/dL (ref 0.61–1.24)
GFR calc Af Amer: >60 mL/min (ref >60)
GLUCOSE: 99 mg/dL (ref 65–99)
Potassium: 3.6 mmol/L (ref 3.5–5.1)
Sodium: 136 mmol/L (ref 135–145)

## 2015-11-17 LAB — TROPONIN I: TROPONIN I: 0.03 ng/mL (ref <0.031)

## 2015-11-17 LAB — BRAIN NATRIURETIC PEPTIDE: B Natriuretic Peptide: 1060 pg/mL — ABNORMAL HIGH (ref 0.0–100.0)

## 2015-11-17 MED ORDER — SODIUM CHLORIDE 0.9 % IV SOLN: INTRAVENOUS | Status: DC

## 2015-11-17 MED ORDER — FUROSEMIDE 10 MG/ML IJ SOLN
80.0000 mg | Freq: Once | INTRAMUSCULAR | Status: AC
  Administered 2015-11-17: 80 mg via INTRAVENOUS
  Filled 2015-11-17: qty 8

## 2015-11-17 NOTE — ED Provider Notes (Signed)
CSN: [id number]     Arrival date & time 11/17/15  2003 History  By signing my name below, I, [name], attest that this documentation has been prepared under the direction and in the presence of Vanetta Mulders, MD. Electronically Signed: Angelene Fitzpatrick, ED Scribe. 11/17/2015. 10:53 PM.    Chief Complaint  Patient presents with  . Chest Pain   Patient is a 50 y.o. male presenting with shortness of breath. The history is provided by the patient. No language interpreter was used.  Shortness of Breath Severity:  Moderate Onset quality:  Gradual Duration:  3 days Timing:  Constant Progression:  Worsening Chronicity:  Recurrent Relieved by:  None tried Worsened by:  Nothing tried Ineffective treatments:  None tried Associated symptoms: cough and rash   Associated symptoms: no fever and no sore throat    HPI Comments: [name] is a 50 y.o. male who presents to the Emergency Department complaining of gradually worsening constant SOB onset 3 days ago. He reports associated sharp pain to his posterior head, generalized abdominal tightness, and decreased urine output. No alleviating noted. Pt has not tried any medications for his symptoms. He is currently of Xarelto and Demadex. He denies that he is currently on dialysis. He denies any CP. Pt was admitted on 11/05/15 for CHF and was discharged approx one week ago.   [facility] PCP in [place] but pt does not remember name   Past Medical History  Diagnosis Date  . Hypertensive heart disease     Hypertensive heart disease with prior episodes of CHF  . Hyperlipidemia   . Hypertension   . Gastroesophageal reflux disease   . Osteoarthritis   . Peptic ulcer disease   . Morbid obesity (HCC)     /sleep apnea  . Allergic rhinitis     plus h/o asthma  . Tobacco abuse, in remission     Cigarettes discontinued in 2010  . CHF (congestive heart failure) (HCC)   . A-fib (HCC)     2015  . Asthma   . Noncompliance   .  Pulmonary hypertension (HCC) 09/19/2015  . Chronic diastolic CHF (congestive heart failure) [facility])    Past Surgical History  Procedure Laterality Date  . None    . Cardiac catheterization N/A 06/14/2015    Procedure: Right/Left Heart Cath and Coronary Angiography;  Surgeon: Runell Gess, MD;  Location: [facility] INVASIVE CV LAB;  Service: Cardiovascular;  Laterality: N/A;  . Tubal ligation     Family History  Problem Relation Age of Onset  . Colon cancer Neg Hx   . Inflammatory bowel disease Neg Hx   . Liver disease Neg Hx   . Stroke Father   . Heart attack Father    Social History  Substance Use Topics  . Smoking status: Former Smoker -- 0.50 packs/day for 20 years    Types: Cigarettes    Start date: 04/26/1988    Quit date: 06/23/2007  . Smokeless tobacco: Never Used     Comment: 1 ppd former smoker  . Alcohol Use: No     Comment: no etoh since 2009    Review of Systems  Constitutional: Negative for fever and chills.  HENT: Negative for rhinorrhea and sore throat.   Eyes: Negative for visual disturbance.  Respiratory: Positive for cough and shortness of breath.   Cardiovascular: Positive for leg swelling.  Gastrointestinal: Positive for diarrhea.  Genitourinary: Negative for dysuria and hematuria.  Skin: Positive for rash.  Hematological: Bruises/bleeds easily.  Psychiatric/Behavioral: Negative for confusion.      Allergies  Bee venom; Lipitor; Aspirin; Banana; Diltiazem; and Hydralazine  Home Medications   Prior to Admission medications   Medication Sig Start Date End Date Taking? Authorizing Provider  beclomethasone (QVAR) 80 MCG/ACT inhaler Inhale 2 puffs into the lungs daily as needed (shortness of breath).    Yes Historical Provider, MD  KLOR-CON M20 20 MEQ tablet Take 20 mEq by mouth 2 (two) times daily. 09/04/15  Yes Historical Provider, MD  losartan (COZAAR) 100 MG tablet Take 100 mg by mouth daily.  09/16/15  Yes Historical Provider, MD  metoprolol succinate  (TOPROL-XL) 25 MG 24 hr tablet Take 3 tablets (75 mg total) by mouth 2 (two) times daily. Take with or immediately following a meal. 10/31/15  Yes Mauricio Annett Gula, MD  rivaroxaban (XARELTO) 20 MG TABS tablet Take 1 tablet (20 mg total) by mouth daily with supper. Patient taking differently: Take 20 mg by mouth every evening.  09/15/15  Yes Henderson Cloud, MD  spironolactone (ALDACTONE) 25 MG tablet Take 0.5 tablets (12.5 mg total) by mouth daily. Patient taking differently: Take 25 mg by mouth daily.  10/15/15  Yes Estela Isaiah Blakes, MD  torsemide (DEMADEX) 20 MG tablet Take 2 tablets (40 mg total) by mouth 2 (two) times daily. Patient taking differently: Take 20 mg by mouth 2 (two) times daily.  10/15/15  Yes Henderson Cloud, MD   BP 149/72 mmHg  Pulse 76  Temp(Src) 98.4 F (36.9 C) (Oral)  Resp 17  SpO2 100% Physical Exam  Constitutional: He is oriented to person, place, and time. He appears well-developed and well-nourished.  HENT:  Head: Normocephalic and atraumatic.  Mucus membrane slightly dry  Eyes: Pupils are equal, round, and reactive to light.  Eyes track normal Sclera clear  Cardiovascular: An irregular rhythm present. Tachycardia present.   Pulmonary/Chest: Effort normal.  Lungs clear bilaterally but decreased breath sounds  Abdominal: Bowel sounds are normal. There is tenderness (slightly). There is no rebound.  Musculoskeletal:  Pitting edema bilaterally  Neurological: He is alert and oriented to person, place, and time.  Skin: Skin is warm and dry.  Psychiatric: He has a normal mood and affect.  Nursing note and vitals reviewed.   ED Course  Procedures (including critical care time) DIAGNOSTIC STUDIES: Oxygen Saturation is 100% on RA, normal by my interpretation.    COORDINATION OF CARE: 10:33 PM- Pt advised of plan for treatment and pt agrees. Pt will receive chest x-ray and lab work for further evaluation. Explained the  likelihood of admission.    Labs Review Labs Reviewed  BASIC METABOLIC PANEL - Abnormal; Notable for the following:    BUN 22 (*)    Calcium 8.5 (*)    All other components within normal limits  CBC WITH DIFFERENTIAL/PLATELET - Abnormal; Notable for the following:    RBC 3.95 (*)    Hemoglobin 10.8 (*)    HCT 33.5 (*)    All other components within normal limits  BRAIN NATRIURETIC PEPTIDE - Abnormal; Notable for the following:    B Natriuretic Peptide 1060.0 (*)    All other components within normal limits  TROPONIN I    Imaging Review Dg Chest 2 View  11/17/2015  CLINICAL DATA:  Chest pain and short of breath EXAM: CHEST  2 VIEW COMPARISON:  11/05/2015 FINDINGS: Cardiac enlargement with vascular congestion. Progression of bibasilar airspace disease. Small amount of fluid in the fissure. No layering  pleural effusion. IMPRESSION: Congestive heart failure with pulmonary edema which has progressed in the interval. Progression of bibasilar airspace disease compatible with edema/infiltrate. Electronically Signed   By: Marlan Palau M.D.   On: 11/17/2015 21:18   Vanetta Mulders, MD has personally reviewed and evaluated these images and lab results as part of his medical decision-making.   EKG Interpretation   Date/Time:  Sunday Nov 17 2015 20:14:52 EDT Ventricular Rate:  97 PR Interval:    QRS Duration: 90 QT Interval:  364 QTC Calculation: 462 R Axis:   58 Text Interpretation:  Atrial fibrillation with premature ventricular or  aberrantly conducted complexes Nonspecific T wave abnormality Prolonged QT  Abnormal ECG Confirmed by Aaden Buckman  MD, Patrice Moates ([id number]) on 11/17/2015  9:01:16 PM      MDM   Final diagnoses:  Acute on chronic combined systolic and diastolic heart failure (HCC)    A patient with known significant congestive heart failure. With multiple admissions for the same. Patient recently admitted May 16 and discharged on May 20 for CHF exacerbation. Chest x-ray here  today shows congestive heart failure with pulmonary edema with progression in the interval. Also some question of bibasilar airspace disease compatible with edema most likely doubt pneumonia. The patient's had increased shortness of breath and leg swelling for the past 2 days. Patient received 80 mg of Lasix here with some improvement in the blood pressure. Will discuss with the hospitalist for readmission for control. Patient's oxygen saturation 0K the patient appears short of breath. Patient's troponin was negative. EKG shows atrial fibrillation weighs known to have. He is on blood thinners for this.  I personally performed the services described in this documentation, which was scribed in my presence. The recorded information has been reviewed and is accurate.      Vanetta Mulders, MD 11/17/15 [phone]

## 2015-11-17 NOTE — ED Notes (Signed)
Pt c/o "fluid retention" abd pain, chest pain, headache that started a few days ago, sob with exertion

## 2015-11-17 NOTE — H&P (Signed)
History and Physical    [name] XOV:[id number] DOB: Dec 29, 1965 DOA: 11/17/2015  Referring MD/NP/PA: Vanetta Mulders, MD PCP: No PCP Per Patient Outpatient Specialists: Gastroenterology; Samuel Ade, MD Patient coming from: home  Chief Complaint: Shortness of breath  HPI: [name] is a 50 y.o. male with medical history significant of essential HTN, OSA, HLD, GERD, and CHF, was recently discharged from [facility] one week ago after being treated for CHF exacerbation. Today, he presents with complaints of gradually worsening, constant shortness of breath onset 3 days ago. Pt also reports a sharp pain located at the posterior region of his head, generalized abdominal rightness, and decreased urine output. He denies any chest pain. He did not take his medication today, and  denies any alleviating factors.   He has hx of severe noncompliant, and recurrent admissions for CHF.   While in the ED, workup showed an elevated BNP of 1060. His troponin was wnl. A CXR was performed and showed congestive heart failure with pulmonary edema which has progressed in the interval since his last admission. Hospitalist was asked to refer for admission for treatment of CHF exacerbation.  He was given IV Lasix in the ER, and hospitalist was asked to admit him once again for acute on chronic combined CHF>   Review of Systems: As per HPI otherwise 10 point review of systems negative.   Past Medical History  Diagnosis Date  . Hypertensive heart disease     Hypertensive heart disease with prior episodes of CHF  . Hyperlipidemia   . Hypertension   . Gastroesophageal reflux disease   . Osteoarthritis   . Peptic ulcer disease   . Morbid obesity (HCC)     /sleep apnea  . Allergic rhinitis     plus h/o asthma  . Tobacco abuse, in remission     Cigarettes discontinued in 2010  . CHF (congestive heart failure) (HCC)   . A-fib (HCC)     2015  . Asthma   . Noncompliance   . Pulmonary hypertension (HCC)  09/19/2015  . Chronic diastolic CHF (congestive heart failure) [facility])     Past Surgical History  Procedure Laterality Date  . None    . Cardiac catheterization N/A 06/14/2015    Procedure: Right/Left Heart Cath and Coronary Angiography;  Surgeon: Samuel Gess, MD;  Location: [facility] INVASIVE CV LAB;  Service: Cardiovascular;  Laterality: N/A;  . Tubal ligation       reports that he quit smoking about 8 years ago. His smoking use included Cigarettes. He started smoking about 27 years ago. He has a 10 pack-year smoking history. He has never used smokeless tobacco. He reports that he does not drink alcohol or use illicit drugs.  Allergies  Allergen Reactions  . Bee Venom Shortness Of Breath and Swelling  . Lipitor [Atorvastatin] Shortness Of Breath and Other (See Comments)    Nose bleed  . Aspirin Other (See Comments)    Chewable 81 mg tablets upsets patients stomach  . Banana Diarrhea  . Diltiazem Other (See Comments)    Makes heart beat fast  . Hydralazine Other (See Comments)    Patient states it causes Tachycardia    Family History  Problem Relation Age of Onset  . Colon cancer Neg Hx   . Inflammatory bowel disease Neg Hx   . Liver disease Neg Hx   . Stroke Father   . Heart attack Father     Prior to Admission medications   Medication  Sig Start Date End Date Taking? Authorizing Provider  beclomethasone (QVAR) 80 MCG/ACT inhaler Inhale 2 puffs into the lungs daily as needed (shortness of breath).    Yes Historical Provider, MD  KLOR-CON M20 20 MEQ tablet Take 20 mEq by mouth 2 (two) times daily. 09/04/15  Yes Historical Provider, MD  losartan (COZAAR) 100 MG tablet Take 100 mg by mouth daily.  09/16/15  Yes Historical Provider, MD  metoprolol succinate (TOPROL-XL) 25 MG 24 hr tablet Take 3 tablets (75 mg total) by mouth 2 (two) times daily. Take with or immediately following a meal. 10/31/15  Yes Samuel Annett Gula, MD  rivaroxaban (XARELTO) 20 MG TABS tablet Take 1 tablet  (20 mg total) by mouth daily with supper. Patient taking differently: Take 20 mg by mouth every evening.  09/15/15  Yes Samuel Cloud, MD  spironolactone (ALDACTONE) 25 MG tablet Take 0.5 tablets (12.5 mg total) by mouth daily. Patient taking differently: Take 25 mg by mouth daily.  10/15/15  Yes Samuel Isaiah Blakes, MD  torsemide (DEMADEX) 20 MG tablet Take 2 tablets (40 mg total) by mouth 2 (two) times daily. Patient taking differently: Take 20 mg by mouth 2 (two) times daily.  10/15/15  Yes Samuel Cloud, MD    Physical Exam: Filed Vitals:   11/17/15 2020 11/17/15 2128  BP: 172/99 149/72  Pulse: 109 76  Temp: 98.4 F (36.9 C)   TempSrc: Oral   Resp: 24 17  SpO2: 96% 100%   Constitutional: NAD, calm, comfortable Filed Vitals:   11/17/15 2020 11/17/15 2128  BP: 172/99 149/72  Pulse: 109 76  Temp: 98.4 F (36.9 C)   TempSrc: Oral   Resp: 24 17  SpO2: 96% 100%   Eyes: PERRL, lids and conjunctivae normal ENMT: Mucous membranes are moist. Posterior pharynx clear of any exudate or lesions.Normal dentition.  Neck: normal, supple, no masses, no thyromegaly Respiratory: No wheezing. Normal respiratory effort. No accessory muscle use. Positive for crackles bilaterally, right greater than left  Cardiovascular: Regular rate and rhythm, no murmurs / rubs / gallops. 2+ pedal pulses. No carotid bruits. -BLE edema Abdomen: no tenderness, no masses palpated. No hepatosplenomegaly. Bowel sounds positive.  Musculoskeletal: no clubbing / cyanosis. No joint deformity upper and lower extremities. Good ROM, no contractures. Normal muscle tone.  Skin: no rashes, lesions, ulcers. No induration Neurologic: CN 2-12 grossly intact. Sensation intact, DTR normal. Strength 5/5 in all 4.  Psychiatric: Normal judgment and insight. Alert and oriented x 3. Normal mood.   Labs on Admission: I have personally reviewed following labs and imaging studies  CBC:  Recent Labs Lab  11/17/15 2026  WBC 4.0  NEUTROABS 2.6  HGB 10.8*  HCT 33.5*  MCV 84.8  PLT 203   Basic Metabolic Panel:  Recent Labs Lab 11/17/15 2026  NA 136  K 3.6  CL 107  CO2 22  GLUCOSE 99  BUN 22*  CREATININE 1.05  CALCIUM 8.5*   Cardiac Enzymes:  Recent Labs Lab 11/17/15 2026  TROPONINI 0.03   Urine analysis:    Component Value Date/Time   COLORURINE YELLOW 10/29/2015 1012   APPEARANCEUR CLEAR 10/29/2015 1012   LABSPEC 1.019 10/29/2015 1012   PHURINE 6.0 10/29/2015 1012   GLUCOSEU NEGATIVE 10/29/2015 1012   GLUCOSEU NEG mg/dL 16/03/9603 5409   HGBUR NEGATIVE 10/29/2015 1012   BILIRUBINUR NEGATIVE 10/29/2015 1012   KETONESUR NEGATIVE 10/29/2015 1012   PROTEINUR 30* 10/29/2015 1012   UROBILINOGEN 0.2 08/08/2014 1445  NITRITE NEGATIVE 10/29/2015 1012   LEUKOCYTESUR NEGATIVE 10/29/2015 1012    Radiological Exams on Admission: Dg Chest 2 View  11/17/2015  CLINICAL DATA:  Chest pain and short of breath EXAM: CHEST  2 VIEW COMPARISON:  11/05/2015 FINDINGS: Cardiac enlargement with vascular congestion. Progression of bibasilar airspace disease. Small amount of fluid in the fissure. No layering pleural effusion. IMPRESSION: Congestive heart failure with pulmonary edema which has progressed in the interval. Progression of bibasilar airspace disease compatible with edema/infiltrate. Electronically Signed   By: Marlan Palau M.D.   On: 11/17/2015 21:18    EKG: Independently reviewed.   Assessment/Plan Principal Problem:   Acute on chronic combined systolic and diastolic heart failure (HCC) Active Problems:   Essential hypertension   Edema of right lower extremity   Chronic atrial fibrillation (HCC)   OSA (obstructive sleep apnea)   CHF (congestive heart failure) (HCC)  1. Acute on chronic combined systolic and diastolic congestive heart failure. Pt does not appear to be in any respiratory distress at this time and his sats have remained in the 90's on RA. CXR showed  congestive heart failure with pulmonary edema. Elevated BNP  At 1060. Troponin has been negative. Will admit to observation. Will give IV Bumex. Monitor I&Os and daily weights.  2. Essential HTN. Continue antihypertensives.  BP is a little elevated today.  Will follow.  3. Chronic atrial fibrillation. Pt is currently taking Xarelto chronically.  Will continue.  His rate is controlled.  4. GERD. Continue PPI 5. Chronic edema of right lower extremity.  6. OSA.   DVT prophylaxis: Xarelto Code Status: Full Family Communication: Discussed with patient. No family present at bedside. Disposition Plan: discharge home once improved Consults called: none Admission status: admit to observation   Houston Siren, MD FACP [facility]  If 7PM-7AM, please contact night-coverage [email or web address] Password [email or web address]  11/17/2015, 11:42 PM   By signing my name below, I, Adron Bene, attest that this documentation has been prepared under the direction and in the presence of Houston Siren, MD. Electronically Signed: Adron Bene, Scribe 11/17/2015 11:40pm

## 2015-11-18 DIAGNOSIS — I10 Essential (primary) hypertension: Secondary | ICD-10-CM | POA: Diagnosis not present

## 2015-11-18 DIAGNOSIS — Z9119 Patient's noncompliance with other medical treatment and regimen: Secondary | ICD-10-CM | POA: Diagnosis not present

## 2015-11-18 DIAGNOSIS — G4733 Obstructive sleep apnea (adult) (pediatric): Secondary | ICD-10-CM

## 2015-11-18 DIAGNOSIS — R600 Localized edema: Secondary | ICD-10-CM | POA: Diagnosis not present

## 2015-11-18 DIAGNOSIS — I5031 Acute diastolic (congestive) heart failure: Secondary | ICD-10-CM | POA: Diagnosis present

## 2015-11-18 DIAGNOSIS — E785 Hyperlipidemia, unspecified: Secondary | ICD-10-CM | POA: Diagnosis present

## 2015-11-18 DIAGNOSIS — I110 Hypertensive heart disease with heart failure: Secondary | ICD-10-CM | POA: Diagnosis present

## 2015-11-18 DIAGNOSIS — Z8701 Personal history of pneumonia (recurrent): Secondary | ICD-10-CM | POA: Diagnosis not present

## 2015-11-18 DIAGNOSIS — Z87891 Personal history of nicotine dependence: Secondary | ICD-10-CM | POA: Diagnosis not present

## 2015-11-18 DIAGNOSIS — R0602 Shortness of breath: Secondary | ICD-10-CM | POA: Diagnosis present

## 2015-11-18 DIAGNOSIS — I5043 Acute on chronic combined systolic (congestive) and diastolic (congestive) heart failure: Secondary | ICD-10-CM | POA: Diagnosis not present

## 2015-11-18 DIAGNOSIS — Z7901 Long term (current) use of anticoagulants: Secondary | ICD-10-CM | POA: Diagnosis not present

## 2015-11-18 DIAGNOSIS — I482 Chronic atrial fibrillation: Secondary | ICD-10-CM | POA: Diagnosis not present

## 2015-11-18 DIAGNOSIS — K219 Gastro-esophageal reflux disease without esophagitis: Secondary | ICD-10-CM | POA: Diagnosis present

## 2015-11-18 LAB — BASIC METABOLIC PANEL
Anion gap: 5 (ref 5–15)
BUN: 20 mg/dL (ref 6–20)
CALCIUM: 8.3 mg/dL — AB (ref 8.9–10.3)
CO2: 26 mmol/L (ref 22–32)
CREATININE: 1.07 mg/dL (ref 0.61–1.24)
Chloride: 107 mmol/L (ref 101–111)
GFR calc non Af Amer: >60 mL/min (ref >60)
Glucose, Bld: 90 mg/dL (ref 65–99)
Potassium: 3.4 mmol/L — ABNORMAL LOW (ref 3.5–5.1)
SODIUM: 138 mmol/L (ref 135–145)

## 2015-11-18 LAB — CBC
HCT: 33.7 % — ABNORMAL LOW (ref 39.0–52.0)
Hemoglobin: 10.7 g/dL — ABNORMAL LOW (ref 13.0–17.0)
MCH: 27 pg (ref 26.0–34.0)
MCHC: 31.8 g/dL (ref 30.0–36.0)
MCV: 85.1 fL (ref 78.0–100.0)
PLATELETS: 202 10*3/uL (ref 150–400)
RBC: 3.96 MIL/uL — AB (ref 4.22–5.81)
RDW: 15 % (ref 11.5–15.5)
WBC: 4.6 10*3/uL (ref 4.0–10.5)

## 2015-11-18 MED ORDER — BUDESONIDE 0.5 MG/2ML IN SUSP: 2.0000 mL | Freq: Every day | RESPIRATORY_TRACT | Status: DC | PRN

## 2015-11-18 MED ORDER — SODIUM CHLORIDE 0.9% FLUSH
3.0000 mL | Freq: Two times a day (BID) | INTRAVENOUS | Status: DC
  Administered 2015-11-18 – 2015-11-20 (×6): 3 mL via INTRAVENOUS

## 2015-11-18 MED ORDER — RIVAROXABAN 20 MG PO TABS
20.0000 mg | ORAL_TABLET | Freq: Every evening | ORAL | Status: DC
  Administered 2015-11-18 – 2015-11-20 (×3): 20 mg via ORAL
  Filled 2015-11-18 (×3): qty 1

## 2015-11-18 MED ORDER — POTASSIUM CHLORIDE CRYS ER 20 MEQ PO TBCR
20.0000 meq | EXTENDED_RELEASE_TABLET | Freq: Two times a day (BID) | ORAL | Status: DC
Start: 2015-11-18 — End: 2015-11-21
  Administered 2015-11-18 – 2015-11-21 (×8): 20 meq via ORAL
  Filled 2015-11-18 (×8): qty 1

## 2015-11-18 MED ORDER — BUMETANIDE 0.25 MG/ML IJ SOLN
1.0000 mg | Freq: Two times a day (BID) | INTRAMUSCULAR | Status: DC
  Administered 2015-11-18 – 2015-11-21 (×8): 1 mg via INTRAVENOUS
  Filled 2015-11-18 (×8): qty 4

## 2015-11-18 MED ORDER — ACETAMINOPHEN 325 MG PO TABS
650.0000 mg | ORAL_TABLET | ORAL | Status: DC | PRN
  Administered 2015-11-18: 650 mg via ORAL
  Filled 2015-11-18: qty 2

## 2015-11-18 MED ORDER — METOPROLOL SUCCINATE ER 50 MG PO TB24
75.0000 mg | ORAL_TABLET | Freq: Two times a day (BID) | ORAL | Status: DC
  Administered 2015-11-18 – 2015-11-21 (×8): 75 mg via ORAL
  Filled 2015-11-18 (×8): qty 1

## 2015-11-18 MED ORDER — LOSARTAN POTASSIUM 50 MG PO TABS
100.0000 mg | ORAL_TABLET | Freq: Every day | ORAL | Status: DC
  Administered 2015-11-18 – 2015-11-21 (×4): 100 mg via ORAL
  Filled 2015-11-18 (×4): qty 2

## 2015-11-18 MED ORDER — KETOROLAC TROMETHAMINE 30 MG/ML IJ SOLN
30.0000 mg | Freq: Once | INTRAMUSCULAR | Status: AC
  Administered 2015-11-18: 30 mg via INTRAVENOUS
  Filled 2015-11-18: qty 1

## 2015-11-18 NOTE — Care Management Obs Status (Signed)
MEDICARE OBSERVATION STATUS NOTIFICATION   Patient Details  Name: [name] MRN: [id number] Date of Birth: 1965-10-04   Medicare Observation Status Notification Given:  Yes    Adonis Huguenin, RN 11/18/2015, 8:16 AM

## 2015-11-18 NOTE — ED Notes (Signed)
Report called to [facility] on Dept 300

## 2015-11-18 NOTE — Progress Notes (Signed)
PROGRESS NOTE    SHAWNTEZ Fitzpatrick  DUK:[id number] DOB: 1965/08/12 DOA: 11/17/2015 PCP: No PCP Per Patient    Brief Narrative:  50 y.o. male with medical history significant of essential HTN, OSA, HLD, GERD, and CHF, was recently discharged from [facility] one week ago after being treated for CHF exacerbation. Today, he presents with complaints of gradually worsening, constant shortness of breath onset 3 days ago. Pt also reports a sharp pain located at the posterior region of his head, generalized abdominal rightness, and decreased urine output. He denies any chest pain. He did not take his medication today, and denies any alleviating factors. He has hx of severe noncompliant, and recurrent admissions for CHF   Assessment & Plan:   Principal Problem:   Acute on chronic combined systolic and diastolic heart failure (HCC) Active Problems:   Essential hypertension   Edema of right lower extremity   Chronic atrial fibrillation (HCC)   OSA (obstructive sleep apnea)   CHF (congestive heart failure) (HCC)  1. Acute on chronic combined systolic and diastolic congestive heart failure.  1. CXR showed congestive heart failure with pulmonary edema.  2. Elevated BNP At 1060. Troponin has been negative.  3. Patient continued on IV Bumex.  4. Monitor I&Os and daily weights.  5. After speaking with the patient, I strongly suspect gross medical/dietary noncompliance as a primary contributing factor related to this patient's early readmission (for example, patient admits to eating potato chips among other salt laden foods). Patient is resistant to dietary education at this time and blames his readmission on his home diuretic meds. I suspect patient will very likely get readmitted in the near future for the same issues despite appropriate medical care per hospital staff and that future early readmissions are solely the result of his own doing. 2. Essential HTN.  1. Continue antihypertensives. 2. Continue  to follow.  3. Chronic atrial fibrillation.  1. Pt is currently taking Xarelto chronically.  2. Continue metoprolol 3. Remains rate controlled  4. GERD.  1. Continue PPI 5. Chronic edema of right lower extremity.  6. OSA.  DVT prophylaxis: On Xarelto Code Status: Full Family Communication: Patient in room, family not at bedside Disposition Plan: Uncertain at this time   Consultants:     Procedures:     Antimicrobials:  Antibiotics Given (last 72 hours)    None           Subjective: No complaints at present  Objective: Filed Vitals:   11/18/15 0111 11/18/15 0123 11/18/15 0419 11/18/15 0524  BP:  139/125  152/100  Pulse:  81  97  Temp:  99.1 F (37.3 C)  98.8 F (37.1 C)  TempSrc:  Oral  Oral  Resp:  18  18  Height:   6\' 1"  (1.854 m)   Weight:    131.3 kg (289 lb 7.4 oz)  SpO2: 100% 100%  98%    Intake/Output Summary (Last 24 hours) at 11/18/15 1358 Last data filed at 11/18/15 0524  Gross per 24 hour  Intake      0 ml  Output   1900 ml  Net  -1900 ml   Filed Weights   11/18/15 0524  Weight: 131.3 kg (289 lb 7.4 oz)    Examination:  General exam: Appears calm and comfortable, sitting in chair Respiratory system: Clear to auscultation. Respiratory effort normal. Cardiovascular system: S1 & S2 heard, RRR.  Gastrointestinal system: Abdomen is nondistended, soft and nontender. No organomegaly or masses felt. Normal bowel  sounds heard. Central nervous system: Alert and oriented. No focal neurological deficits. Extremities: Symmetric 5 x 5 power. Skin: No rashes, lesions Psychiatry: Judgement and insight appear normal. Mood & affect appropriate.     Data Reviewed: I have personally reviewed following labs and imaging studies  CBC:  Recent Labs Lab 11/17/15 2026 11/18/15 0601  WBC 4.0 4.6  NEUTROABS 2.6  --   HGB 10.8* 10.7*  HCT 33.5* 33.7*  MCV 84.8 85.1  PLT 203 202   Basic Metabolic Panel:  Recent Labs Lab 11/17/15 2026  11/18/15 0601  NA 136 138  K 3.6 3.4*  CL 107 107  CO2 22 26  GLUCOSE 99 90  BUN 22* 20  CREATININE 1.05 1.07  CALCIUM 8.5* 8.3*   GFR: Estimated Creatinine Clearance: 121.3 mL/min (by C-G formula based on Cr of 1.07). Liver Function Tests: No results for input(s): AST, ALT, ALKPHOS, BILITOT, PROT, ALBUMIN in the last 168 hours. No results for input(s): LIPASE, AMYLASE in the last 168 hours. No results for input(s): AMMONIA in the last 168 hours. Coagulation Profile: No results for input(s): INR, PROTIME in the last 168 hours. Cardiac Enzymes:  Recent Labs Lab 11/17/15 2026  TROPONINI 0.03   BNP (last 3 results) No results for input(s): PROBNP in the last 8760 hours. HbA1C: No results for input(s): HGBA1C in the last 72 hours. CBG: No results for input(s): GLUCAP in the last 168 hours. Lipid Profile: No results for input(s): CHOL, HDL, LDLCALC, TRIG, CHOLHDL, LDLDIRECT in the last 72 hours. Thyroid Function Tests: No results for input(s): TSH, T4TOTAL, FREET4, T3FREE, THYROIDAB in the last 72 hours. Anemia Panel: No results for input(s): VITAMINB12, FOLATE, FERRITIN, TIBC, IRON, RETICCTPCT in the last 72 hours. Sepsis Labs: No results for input(s): PROCALCITON, LATICACIDVEN in the last 168 hours.  No results found for this or any previous visit (from the past 240 hour(s)).       Radiology Studies: Dg Chest 2 View  11/17/2015  CLINICAL DATA:  Chest pain and short of breath EXAM: CHEST  2 VIEW COMPARISON:  11/05/2015 FINDINGS: Cardiac enlargement with vascular congestion. Progression of bibasilar airspace disease. Small amount of fluid in the fissure. No layering pleural effusion. IMPRESSION: Congestive heart failure with pulmonary edema which has progressed in the interval. Progression of bibasilar airspace disease compatible with edema/infiltrate. Electronically Signed   By: Marlan Palau M.D.   On: 11/17/2015 21:18        Scheduled Meds: . bumetanide  1 mg  Intravenous Q12H  . losartan  100 mg Oral Daily  . metoprolol succinate  75 mg Oral BID  . potassium chloride SA  20 mEq Oral BID  . rivaroxaban  20 mg Oral QPM  . sodium chloride flush  3 mL Intravenous Q12H   Continuous Infusions:       Nigel Wessman, Scheryl Marten, MD [facility] Pager [phone]  If 7PM-7AM, please contact night-coverage [email or web address] Password [email or web address] 11/18/2015, 1:58 PM

## 2015-11-18 NOTE — Care Management Note (Signed)
Case Management Note  Patient Details  Name: [name] MRN: [id number] Date of Birth: 10-Nov-1965  Subjective/Objective:    Patient alert and sitting in chair  Eating breakfast. Alert oriented from home. No O2. Is HRI with [facility]. Inform Therisa Doyne of admission as observation.                Action/Plan:Home with Home Health HRI program.  Expected Discharge Date:                  Expected Discharge Plan:  Home w Home Health Services  In-House Referral:     Discharge planning Services  CM Consult  Post Acute Care Choice:  Resumption of Svcs/PTA Provider Choice offered to:     DME Arranged:    DME Agency:     HH Arranged:    HH Agency:  [facility]  Status of Service:  In process, will continue to follow  Medicare Important Message Given:    Date Medicare IM Given:    Medicare IM give by:    Date Additional Medicare IM Given:    Additional Medicare Important Message give by:     If discussed at Long Length of Stay Meetings, dates discussed:    Additional Comments:  Adonis Huguenin, RN 11/18/2015, 8:27 AM

## 2015-11-19 LAB — BASIC METABOLIC PANEL
Anion gap: 9 (ref 5–15)
BUN: 27 mg/dL — AB (ref 6–20)
CHLORIDE: 106 mmol/L (ref 101–111)
CO2: 27 mmol/L (ref 22–32)
CREATININE: 1.3 mg/dL — AB (ref 0.61–1.24)
Calcium: 8.9 mg/dL (ref 8.9–10.3)
GFR calc Af Amer: >60 mL/min (ref >60)
GFR calc non Af Amer: >60 mL/min (ref >60)
Glucose, Bld: 82 mg/dL (ref 65–99)
Potassium: 3.7 mmol/L (ref 3.5–5.1)
Sodium: 142 mmol/L (ref 135–145)

## 2015-11-19 NOTE — Progress Notes (Signed)
[facility]  Patient Status: Active (receiving services up to time of hospitalization)  [facility] is providing the following services: RN and MSW  If patient discharges after hours, please call [phone].   [name] 11/19/2015, 2:27 PM

## 2015-11-19 NOTE — Progress Notes (Signed)
PROGRESS NOTE    [name]  EOF:[id number] DOB: 04-04-1966 DOA: 11/17/2015 PCP: No PCP Per Patient    Brief Narrative:  50 y.o. male with medical history significant of essential HTN, OSA, HLD, GERD, and CHF, was recently discharged from [facility] one week ago after being treated for CHF exacerbation. Today, he presents with complaints of gradually worsening, constant shortness of breath onset 3 days ago. Pt also reports a sharp pain located at the posterior region of his head, generalized abdominal rightness, and decreased urine output. He denies any chest pain. He did not take his medication today, and denies any alleviating factors. He has hx of severe noncompliant, and recurrent admissions for CHF   Assessment & Plan:   Principal Problem:   Acute on chronic combined systolic and diastolic heart failure (HCC) Active Problems:   Essential hypertension   Edema of right lower extremity   Chronic atrial fibrillation (HCC)   OSA (obstructive sleep apnea)   CHF (congestive heart failure) (HCC)   Acute diastolic CHF (congestive heart failure) (HCC)  1. Acute on chronic combined systolic and diastolic congestive heart failure.  1. CXR showed congestive heart failure with pulmonary edema.  2. Elevated BNP At 1060. Troponin has been negative.  3. Patient is continued on IV Bumex.  4. Monitor I&Os and daily weights.  5. See previous note. I strongly suspect gross medical/dietary noncompliance as a primary contributing factor related to this patient's early readmission (for example, patient admits to eating potato chips among other salt laden foods). Patient is resistant to dietary education at this time and blames his readmission on his home diuretic meds. I suspect patient will very likely get readmitted in the near future for the same issues despite appropriate medical care per hospital staff and that future early readmissions are solely the result of his own doing. 6. Dry weight  noted to be 127.5 kg. Weight today 132.2 kg 7. Currently net -2.8 L 2. Essential HTN.  1. Continue antihypertensives. 2. Continue to follow.  3. Chronic atrial fibrillation.  1. Pt is currently taking Xarelto chronically.  2. Continue metoprolol 3. Remains rate controlled  4. GERD.  1. Continue PPI 5. Chronic edema of right lower extremity.  6. OSA.  DVT prophylaxis: On Xarelto Code Status: Full Family Communication: Patient in room, family not at bedside Disposition Plan: Uncertain at this time   Consultants:     Procedures:     Antimicrobials:  Antibiotics Given (last 72 hours)    None          Subjective: No complaints at present  Objective: Filed Vitals:   11/18/15 2153 11/19/15 0607 11/19/15 0613 11/19/15 0927  BP: 131/107 144/94  144/105  Pulse: 89 84  102  Temp: 97.9 F (36.6 C) 97.7 F (36.5 C)    TempSrc: Oral Oral    Resp: 20 20  20   Height:      Weight:   132.269 kg (291 lb 9.6 oz)   SpO2: 100% 95%  99%    Intake/Output Summary (Last 24 hours) at 11/19/15 1358 Last data filed at 11/19/15 0933  Gross per 24 hour  Intake    240 ml  Output   1700 ml  Net  -1460 ml   Filed Weights   11/18/15 0524 11/19/15 0613  Weight: 131.3 kg (289 lb 7.4 oz) 132.269 kg (291 lb 9.6 oz)    Examination:  General exam: Appears calm and comfortable Respiratory system: Clear to auscultation. Respiratory effort  normal. Cardiovascular system: S1 & S2 heard, RRR.  Gastrointestinal system: Abdomen is nondistended, soft and nontender. No organomegaly or masses felt. Normal bowel sounds heard. Central nervous system: Alert and oriented. No focal neurological deficits. Extremities: Symmetric 5 x 5 power. Skin: No rashes, lesions Psychiatry: Judgement and insight appear normal. Normal mood and affect.     Data Reviewed: I have personally reviewed following labs and imaging studies  CBC:  Recent Labs Lab 11/17/15 2026 11/18/15 0601  WBC 4.0 4.6    NEUTROABS 2.6  --   HGB 10.8* 10.7*  HCT 33.5* 33.7*  MCV 84.8 85.1  PLT 203 202   Basic Metabolic Panel:  Recent Labs Lab 11/17/15 2026 11/18/15 0601 11/19/15 0556  NA 136 138 142  K 3.6 3.4* 3.7  CL 107 107 106  CO2 GLUCOSE 99 90 82  BUN 22* 20 27*  CREATININE 1.05 1.07 1.30*  CALCIUM 8.5* 8.3* 8.9   GFR: Estimated Creatinine Clearance: 100.3 mL/min (by C-G formula based on Cr of 1.3). Liver Function Tests: No results for input(s): AST, ALT, ALKPHOS, BILITOT, PROT, ALBUMIN in the last 168 hours. No results for input(s): LIPASE, AMYLASE in the last 168 hours. No results for input(s): AMMONIA in the last 168 hours. Coagulation Profile: No results for input(s): INR, PROTIME in the last 168 hours. Cardiac Enzymes:  Recent Labs Lab 11/17/15 2026  TROPONINI 0.03   BNP (last 3 results) No results for input(s): PROBNP in the last 8760 hours. HbA1C: No results for input(s): HGBA1C in the last 72 hours. CBG: No results for input(s): GLUCAP in the last 168 hours. Lipid Profile: No results for input(s): CHOL, HDL, LDLCALC, TRIG, CHOLHDL, LDLDIRECT in the last 72 hours. Thyroid Function Tests: No results for input(s): TSH, T4TOTAL, FREET4, T3FREE, THYROIDAB in the last 72 hours. Anemia Panel: No results for input(s): VITAMINB12, FOLATE, FERRITIN, TIBC, IRON, RETICCTPCT in the last 72 hours. Sepsis Labs: No results for input(s): PROCALCITON, LATICACIDVEN in the last 168 hours.  No results found for this or any previous visit (from the past 240 hour(s)).       Radiology Studies: Dg Chest 2 View  11/17/2015  CLINICAL DATA:  Chest pain and short of breath EXAM: CHEST  2 VIEW COMPARISON:  11/05/2015 FINDINGS: Cardiac enlargement with vascular congestion. Progression of bibasilar airspace disease. Small amount of fluid in the fissure. No layering pleural effusion. IMPRESSION: Congestive heart failure with pulmonary edema which has progressed in the interval.  Progression of bibasilar airspace disease compatible with edema/infiltrate. Electronically Signed   By: Marlan Palau M.D.   On: 11/17/2015 21:18        Scheduled Meds: . bumetanide  1 mg Intravenous Q12H  . losartan  100 mg Oral Daily  . metoprolol succinate  75 mg Oral BID  . potassium chloride SA  20 mEq Oral BID  . rivaroxaban  20 mg Oral QPM  . sodium chloride flush  3 mL Intravenous Q12H   Continuous Infusions:    LOS: 1 day    CHIU, Scheryl Marten, MD [facility] Pager [phone]  If 7PM-7AM, please contact night-coverage [email or web address] Password [email or web address] 11/19/2015, 1:58 PM

## 2015-11-20 DIAGNOSIS — I10 Essential (primary) hypertension: Secondary | ICD-10-CM

## 2015-11-20 DIAGNOSIS — I482 Chronic atrial fibrillation: Secondary | ICD-10-CM

## 2015-11-20 DIAGNOSIS — R600 Localized edema: Secondary | ICD-10-CM

## 2015-11-20 DIAGNOSIS — I5043 Acute on chronic combined systolic (congestive) and diastolic (congestive) heart failure: Secondary | ICD-10-CM

## 2015-11-20 LAB — BASIC METABOLIC PANEL
ANION GAP: 8 (ref 5–15)
BUN: 27 mg/dL — ABNORMAL HIGH (ref 6–20)
CALCIUM: 8.6 mg/dL — AB (ref 8.9–10.3)
CO2: 25 mmol/L (ref 22–32)
Chloride: 108 mmol/L (ref 101–111)
Creatinine, Ser: 1.11 mg/dL (ref 0.61–1.24)
GFR calc non Af Amer: >60 mL/min (ref >60)
Glucose, Bld: 82 mg/dL (ref 65–99)
POTASSIUM: 3.4 mmol/L — AB (ref 3.5–5.1)
Sodium: 141 mmol/L (ref 135–145)

## 2015-11-20 MED ORDER — POTASSIUM CHLORIDE CRYS ER 20 MEQ PO TBCR
40.0000 meq | EXTENDED_RELEASE_TABLET | Freq: Once | ORAL | Status: AC
  Administered 2015-11-20: 40 meq via ORAL
  Filled 2015-11-20: qty 2

## 2015-11-20 NOTE — Progress Notes (Signed)
Spoke with [name] at [facility] who came by to check on patient today.  Discussed case.  Patient has been making his appointments with Dr Selena Batten but concern was that he had been released from his cardiologist, Michelle Nasuti stated that she thought this was incorrect . Will contact Dr Wyline Mood to see if patient has made his appointments. Concern over patient not taking medications as prescribe and readmissions. Patient has false ideas about some of his medications and need re enforcement of teaching. [facility] with continue to follow patient after discharged. [phone]

## 2015-11-20 NOTE — Progress Notes (Signed)
PROGRESS NOTE    [name]  CXK:[id number] DOB: [birth year] DOA: 11/17/2015 PCP: No PCP Per Patient  Outpatient Specialists:   Gastroenterology: Dr. Jena Gauss  Brief Narrative:  50 y.o. male with medical history significant of essential HTN, OSA, HLD, GERD, and CHF, was recently discharged from [facility] one week ago after being treated for CHF exacerbation. He has returned with complaints of gradually worsening and constant shortness of breath. Pt also reported a sharp pain located at the posterior region of his head, generalized abdominal rightness, and decreased urine output. Denied any alleviating factors.He has hx of severe noncompliant, and recurrent admissions for CHF  Assessment & Plan:   Principal Problem:   Acute on chronic combined systolic and diastolic heart failure (HCC) Active Problems:   Essential hypertension   Edema of right lower extremity   Chronic atrial fibrillation (HCC)   OSA (obstructive sleep apnea)   CHF (congestive heart failure) (HCC)   Acute diastolic CHF (congestive heart failure) (HCC)  1. Acute on chronic combined systolic and diastolic heart failure, improving. Related to noncompliance. CXR revealed heart failure with pulmonary edema on admission. Patient started on IV Bumex with good diuresis. Continue to monitor I&Os and daily weights. Weight has trended down to 130kg. Dry weight is 127kg. Note patient's outpatient noncompliance with medical/dietary recommendations, which is primary contributing factor to readmission.  2. Essential HTN, stable. Continue home medications.  3. Chronic atrial fibrillation. Anticoagulated on Xarelto. Continue metoprolol.  4. GERD. Continue PPI. 5. Chronic edema of right lower extremity. unchanged    DVT prophylaxis: Xarelto.  Code Status:Full Family Communication:  Discussed with patient Disposition Plan: anticipate discharge home in next 24 hours   Consultants:   None   Procedures:   None    Antimicrobials:   None    Subjective: Shortness of breath has resolved. LE edema improving.  Objective: Filed Vitals:   11/19/15 0927 11/19/15 1452 11/19/15 2301 11/20/15 0618  BP: 144/105 129/94 126/86 141/104  Pulse: 102 89 97 73  Temp:  98.3 F (36.8 C) 97.6 F (36.4 C) 98.9 F (37.2 C)  TempSrc:  Oral Oral Oral  Resp: 20 20 20 22   Height:      Weight:    130.409 kg (287 lb 8 oz)  SpO2: 99% 99% 98% 94%    Intake/Output Summary (Last 24 hours) at 11/20/15 0829 Last data filed at 11/20/15 9702  Gross per 24 hour  Intake    720 ml  Output   2100 ml  Net  -1380 ml   Filed Weights   11/18/15 0524 11/19/15 0613 11/20/15 0618  Weight: 131.3 kg (289 lb 7.4 oz) 132.269 kg (291 lb 9.6 oz) 130.409 kg (287 lb 8 oz)    Examination:  General exam: Appears calm and comfortable  Respiratory system: Clear to auscultation. Respiratory effort normal. Cardiovascular system: S1 & S2 heard, RRR. No JVD, murmurs, rubs, gallops or clicks. 1-2+ pedal edema. Gastrointestinal system: Abdomen is nondistended, soft and nontender. No organomegaly or masses felt. Normal bowel sounds heard. Central nervous system: Alert and oriented. No focal neurological deficits. Extremities: Symmetric 5 x 5 power. Skin: No rashes, lesions or ulcers Psychiatry: Judgement and insight appear normal. Mood & affect appropriate.     Data Reviewed: I have personally reviewed following labs and imaging studies  CBC:  Recent Labs Lab 11/17/15 2026 11/18/15 0601  WBC 4.0 4.6  NEUTROABS 2.6  --   HGB 10.8* 10.7*  HCT 33.5* 33.7*  MCV 84.8 85.1  PLT 203 202   Basic Metabolic Panel:  Recent Labs Lab 11/17/15 2026 11/18/15 0601 11/19/15 0556 11/20/15 0551  NA 136 138 142 141  K 3.6 3.4* 3.7 3.4*  CL 107 107 106 108  CO2 GLUCOSE 99 90 82 82  BUN 22* 20 27* 27*  CREATININE 1.05 1.07 1.30* 1.11  CALCIUM 8.5* 8.3* 8.9 8.6*  Cardiac Enzymes:  Recent Labs Lab 11/17/15 2026   TROPONINI 0.03   Urine analysis:    Component Value Date/Time   COLORURINE YELLOW 10/29/2015 1012   APPEARANCEUR CLEAR 10/29/2015 1012   LABSPEC 1.019 10/29/2015 1012   PHURINE 6.0 10/29/2015 1012   GLUCOSEU NEGATIVE 10/29/2015 1012   GLUCOSEU NEG mg/dL 41/32/4401 0272   HGBUR NEGATIVE 10/29/2015 1012   BILIRUBINUR NEGATIVE 10/29/2015 1012   KETONESUR NEGATIVE 10/29/2015 1012   PROTEINUR 30* 10/29/2015 1012   UROBILINOGEN 0.2 08/08/2014 1445   NITRITE NEGATIVE 10/29/2015 1012   LEUKOCYTESUR NEGATIVE 10/29/2015 1012   Sepsis Labs: (procalcitonin:4,lacticidven:4)  )No results found for this or any previous visit (from the past 240 hour(s)).       Radiology Studies: No results found.      Scheduled Meds: . bumetanide  1 mg Intravenous Q12H  . losartan  100 mg Oral Daily  . metoprolol succinate  75 mg Oral BID  . potassium chloride SA  20 mEq Oral BID  . rivaroxaban  20 mg Oral QPM  . sodium chloride flush  3 mL Intravenous Q12H   Continuous Infusions:    LOS: 2 days    Time spent:    Erick Blinks, MD  [facility] Pager [phone]  If 7PM-7AM, please contact night-coverage [email or web address] Password [email or web address] 11/20/2015, 8:29 AM

## 2015-11-21 MED ORDER — RIVAROXABAN 20 MG PO TABS
20.0000 mg | ORAL_TABLET | Freq: Every evening | ORAL | Status: DC
Start: 2015-11-21 — End: 2016-03-28

## 2015-11-21 MED ORDER — SPIRONOLACTONE 25 MG PO TABS: 25.0000 mg | ORAL_TABLET | Freq: Every day | ORAL | Status: DC

## 2015-11-21 MED ORDER — BUMETANIDE 1 MG PO TABS: 1.0000 mg | ORAL_TABLET | Freq: Two times a day (BID) | ORAL | Status: DC

## 2015-11-21 MED ORDER — LOSARTAN POTASSIUM 100 MG PO TABS
100.0000 mg | ORAL_TABLET | Freq: Every day | ORAL | Status: DC
Start: 2015-11-21 — End: 2015-11-27

## 2015-11-21 MED ORDER — BECLOMETHASONE DIPROPIONATE 80 MCG/ACT IN AERS: 2.0000 | INHALATION_SPRAY | Freq: Every day | RESPIRATORY_TRACT | Status: DC | PRN

## 2015-11-21 MED ORDER — KLOR-CON M20 20 MEQ PO TBCR: 20.0000 meq | EXTENDED_RELEASE_TABLET | Freq: Two times a day (BID) | ORAL | Status: DC

## 2015-11-21 MED ORDER — METOPROLOL SUCCINATE ER 25 MG PO TB24
75.0000 mg | ORAL_TABLET | Freq: Two times a day (BID) | ORAL | Status: DC
Start: 2015-11-21 — End: 2015-12-16

## 2015-11-21 NOTE — Progress Notes (Signed)
Discharge instructions read to patient and his family.  Both verbalized understanding of all instructions Pt and family instructed to avoid processed and canned foods to reduce sodium intake.  Discharge to home with family

## 2015-11-21 NOTE — Care Management Note (Signed)
Case Management Note  Patient Details  Name: [name] MRN: [id number] Date of Birth: August 26, 1965  Subjective/Objective: Contacted [facility] [place] [facility] to make awre of patient discharge.                   Action/Plan: Home with family [facility] and [facility]   Expected Discharge Date:                  Expected Discharge Plan:  Home w Home Health Services  In-House Referral:     Discharge planning Services  CM Consult  Post Acute Care Choice:  Resumption of Svcs/PTA Provider Choice offered to:     DME Arranged:    DME Agency:     HH Arranged:    HH Agency:  [facility]  Status of Service:  In process, will continue to follow  Medicare Important Message Given:  Yes Date Medicare IM Given:    Medicare IM give by:    Date Additional Medicare IM Given:    Additional Medicare Important Message give by:     If discussed at Long Length of Stay Meetings, dates discussed:    Additional Comments:  Adonis Huguenin, RN 11/21/2015, 3:09 PM

## 2015-11-21 NOTE — Care Management Important Message (Signed)
Important Message  Patient Details  Name: [name] MRN: [id number] Date of Birth: 23-May-1966   Medicare Important Message Given:  Yes    Adonis Huguenin, RN 11/21/2015, 2:30 PM

## 2015-11-21 NOTE — Discharge Summary (Signed)
Physician Discharge Summary  [name] ZOX:[id number] DOB: [birth year] DOA: 11/17/2015  PCP: No PCP Per Patient  Admit date: 11/17/2015 Discharge date: 11/21/2015  Time spent: 35 minutes  Recommendations for Outpatient Follow-up:  1. Follow up with PCP 1-2 weeks.  Discharge Diagnoses:  Principal Problem:   Acute on chronic combined systolic and diastolic heart failure (HCC) Active Problems:   Essential hypertension   Edema of right lower extremity   Chronic atrial fibrillation (HCC)   OSA (obstructive sleep apnea)   CHF (congestive heart failure) (HCC)   Acute diastolic CHF (congestive heart failure) (HCC)   Discharge Condition: Improved   Diet recommendation: Heart healthy/ low-sodium  Filed Weights   11/19/15 0613 11/20/15 0618 11/21/15 0637  Weight: 132.269 kg (291 lb 9.6 oz) 130.409 kg (287 lb 8 oz) 129.683 kg (285 lb 14.4 oz)    History of present illness:  50 y.o. male with medical history significant of essential HTN, OSA, HLD, GERD, and CHF, was recently discharged from [facility] one week ago after being treated for CHF exacerbation. He has returned with complaints of gradually worsening and constant shortness of breath. Pt also reported a sharp pain located at the posterior region of his head, generalized abdominal rightness, and decreased urine output. Denied any alleviating factors.He has hx of severe noncompliant, and recurrent admissions for CHF  Hospital Course:  Patient was admitted for recurrent acute on chronic combined systolic and diastolic heart failure. CXR on admission noted heart failure with pulmonary edema. He was started on IV Bumex with great diuresis. Volume status -3.6L since admission, although this maybe inaccurate. On discharged transitioned to oral Bumex. Weight is down 6 pds since admission and he is approaching his dry weight. Discharge weight 285. He has been counseled on the importance of compliance with medical/dietary recommendations.  Patient has been discharged from cardiology practice. He has been advised to follow up with his pcp in order to be referred to new cardiology practice. 1. Essential HTN, remained stable. Continued home medications.  2. Chronic atrial fibrillation. Anticoagulated on Xarelto. Continued metoprolol.  3. GERD. Continue PPI. 4. Chronic edema of right lower extremity. Unchanged 5. OSA. Stable  Procedures:  None   Consultations:  None  Discharge Exam: Filed Vitals:   11/20/15 2159 11/21/15 0637  BP: 140/108 114/88  Pulse: 90 102  Temp: 98.1 F (36.7 C) 98.3 F (36.8 C)  Resp: 20 20    General: NAD, looks comfortable  Cardiovascular: RRR, S1, S2   Respiratory: clear bilaterally, No wheezing, rales or rhonchi  Abdomen: soft, non tender, no distention , bowel sounds normal  Musculoskeletal: 1+ edema b/l Discharge Instructions   Discharge Instructions    Diet - low sodium heart healthy    Complete by:  As directed      Increase activity slowly    Complete by:  As directed           Current Discharge Medication List    START taking these medications   Details  bumetanide (BUMEX) 1 MG tablet Take 1 tablet (1 mg total) by mouth 2 (two) times daily. Qty: 60 tablet, Refills: 0      CONTINUE these medications which have CHANGED   Details  beclomethasone (QVAR) 80 MCG/ACT inhaler Inhale 2 puffs into the lungs daily as needed (shortness of breath). Qty: 1 Inhaler, Refills: 12    KLOR-CON M20 20 MEQ tablet Take 1 tablet (20 mEq total) by mouth 2 (two) times daily. Qty: 60 tablet, Refills: 0  losartan (COZAAR) 100 MG tablet Take 1 tablet (100 mg total) by mouth daily. Qty: 30 tablet, Refills: 1    metoprolol succinate (TOPROL-XL) 25 MG 24 hr tablet Take 3 tablets (75 mg total) by mouth 2 (two) times daily. Take with or immediately following a meal. Qty: 180 tablet, Refills: 0    rivaroxaban (XARELTO) 20 MG TABS tablet Take 1 tablet (20 mg total) by mouth every  evening. Qty: 30 tablet, Refills: 2    spironolactone (ALDACTONE) 25 MG tablet Take 1 tablet (25 mg total) by mouth daily. Qty: 30 tablet, Refills: 0      STOP taking these medications     torsemide (DEMADEX) 20 MG tablet        Allergies  Allergen Reactions  . Bee Venom Shortness Of Breath and Swelling  . Lipitor [Atorvastatin] Shortness Of Breath and Other (See Comments)    Nose bleed  . Aspirin Other (See Comments)    Chewable 81 mg tablets upsets patients stomach  . Banana Diarrhea  . Diltiazem Other (See Comments)    Makes heart beat fast  . Hydralazine Other (See Comments)    Patient states it causes Tachycardia      The results of significant diagnostics from this hospitalization (including imaging, microbiology, ancillary and laboratory) are listed below for reference.    Significant Diagnostic Studies: Dg Chest 2 View  11/17/2015  CLINICAL DATA:  Chest pain and short of breath EXAM: CHEST  2 VIEW COMPARISON:  11/05/2015 FINDINGS: Cardiac enlargement with vascular congestion. Progression of bibasilar airspace disease. Small amount of fluid in the fissure. No layering pleural effusion. IMPRESSION: Congestive heart failure with pulmonary edema which has progressed in the interval. Progression of bibasilar airspace disease compatible with edema/infiltrate. Electronically Signed   By: Marlan Palau M.D.   On: 11/17/2015 21:18   Dg Chest 2 View  11/05/2015  CLINICAL DATA:  Shortness of Breath EXAM: CHEST  2 VIEW COMPARISON:  Oct 31, 2015 FINDINGS: There is mild atelectatic change in the right lower lobe region. Lungs elsewhere clear. There is cardiomegaly with pulmonary vascularity within normal limits. No adenopathy. There is mild degenerative change in the thoracic spine. IMPRESSION: Mild right base atelectasis. Lungs elsewhere clear. Stable cardiomegaly. Electronically Signed   By: Bretta Bang III M.D.   On: 11/05/2015 10:52   Dg Chest 2 View  10/31/2015  CLINICAL  DATA:  Lower extremity swelling, recent pneumonia EXAM: CHEST  2 VIEW COMPARISON:  10/29/2015 FINDINGS: Soft tissues overlying the right lower hemithorax. No frank interstitial edema, improved. No focal consolidation. No definite pleural effusion on the lateral view. Cardiomegaly. IMPRESSION: No frank interstitial edema, improved. No evidence of acute cardiopulmonary disease. Electronically Signed   By: Charline Bills M.D.   On: 10/31/2015 08:41   Dg Chest 2 View  10/26/2015  CLINICAL DATA:  Patient with leg swelling and fluid retention. EXAM: CHEST  2 VIEW COMPARISON:  Chest radiograph 10/24/2015 FINDINGS: Stable cardiomegaly. Pulmonary vascular redistribution and mild interstitial pulmonary opacities bilaterally. No definite pleural effusion. Thoracic spine degenerative changes. Lateral view limited due to overlapping soft tissue. IMPRESSION: Cardiomegaly, pulmonary vascular redistribution and mild interstitial edema. Electronically Signed   By: Annia Belt M.D.   On: 10/26/2015 12:15   Dg Chest 2 View  10/24/2015  CLINICAL DATA:  Shortness of Breath EXAM: CHEST  2 VIEW COMPARISON:  10/22/2015 FINDINGS: The cardiac shadow is enlarged but stable. The lungs are well aerated bilaterally. No focal infiltrate or sizable effusion is seen.  No bony abnormality is noted. IMPRESSION: No active cardiopulmonary disease. Electronically Signed   By: Alcide Clever M.D.   On: 10/24/2015 10:09   Ct Chest Wo Contrast  11/05/2015  CLINICAL DATA:  Shortness of breath and chest pain for 3 days, initial encounter EXAM: CT CHEST WITHOUT CONTRAST TECHNIQUE: Multidetector CT imaging of the chest was performed following the standard protocol without IV contrast. COMPARISON:  09/19/2015 FINDINGS: The lungs are well aerated bilaterally. Patchy sub solid infiltrates are noted bilaterally worse on the right than the left. No sizable effusion is seen. The thoracic inlet is within normal limits. The thoracic aorta and pulmonary artery  as visualized are within normal limits although no contrast was administered. No significant hilar or mediastinal adenopathy is noted at this time. A few small scattered mediastinal lymph nodes are noted which are not significant by size criteria. The visualized upper abdomen is within normal limits. The bony structures show no acute abnormality. IMPRESSION: Diffuse patchy ground-glass infiltrates throughout both lungs worse on the right than the left. Given the patient's clinical history of lower extremity swelling this may represent some acute alveolar edema although a an acute infectious etiology deserves consideration as well. Followup examination is recommended following appropriate therapy. Electronically Signed   By: Alcide Clever M.D.   On: 11/05/2015 13:14   Dg Chest Port 1 View  10/29/2015  CLINICAL DATA:  Fever and weakness.  Dyspnea tonight. EXAM: PORTABLE CHEST 1 VIEW COMPARISON:  10/26/2015 FINDINGS: There is moderate cardiomegaly, unchanged. Patchy consolidation in the right upper lobe and right lower lobe may represent pneumonia or asymmetric alveolar edema. No large effusions. IMPRESSION: New patchy airspace opacity in the right lung, consistent with pneumonia or asymmetric alveolar edema. Electronically Signed   By: Ellery Plunk M.D.   On: 10/29/2015 05:29    Microbiology: No results found for this or any previous visit (from the past 240 hour(s)).   Labs: Basic Metabolic Panel:  Recent Labs Lab 11/17/15 2026 11/18/15 0601 11/19/15 0556 11/20/15 0551  NA 136 138 142 141  K 3.6 3.4* 3.7 3.4*  CL 107 107 106 108  CO2 22 26 27 25   GLUCOSE 99 90 82 82  BUN 22* 20 27* 27*  CREATININE 1.05 1.07 1.30* 1.11  CALCIUM 8.5* 8.3* 8.9 8.6*   Liver Function Tests: No results for input(s): AST, ALT, ALKPHOS, BILITOT, PROT, ALBUMIN in the last 168 hours. No results for input(s): LIPASE, AMYLASE in the last 168 hours. No results for input(s): AMMONIA in the last 168  hours. CBC:  Recent Labs Lab 11/17/15 2026 11/18/15 0601  WBC 4.0 4.6  NEUTROABS 2.6  --   HGB 10.8* 10.7*  HCT 33.5* 33.7*  MCV 84.8 85.1  PLT 203 202   Cardiac Enzymes:  Recent Labs Lab 11/17/15 2026  TROPONINI 0.03   BNP: BNP (last 3 results)  Recent Labs  10/26/15 1135 11/05/15 1018 11/17/15 2026  BNP 659.5* 499.0* 1060.0*       Signed:  Erick Blinks, MD  [facility] 11/21/2015, 12:59 PM  By signing my name below, I, Zadie Cleverly, attest that this documentation has been prepared under the direction and in the presence of Erick Blinks, MD. Electronically signed: Zadie Cleverly, Scribe. 11/21/2015 12:49pm   I, Dr. Erick Blinks, personally performed the services described in this documentaiton. All medical record entries made by the scribe were at my direction and in my presence. I have reviewed the chart and agree that the record reflects my personal  performance and is accurate and complete  Erick Blinks, MD, 11/21/2015 12:59 PM

## 2015-11-22 ENCOUNTER — Encounter: Payer: Medicare Other | Admitting: Cardiovascular Disease

## 2015-11-22 NOTE — Care Management (Signed)
Received call from Therisa Doyne at [facility]. Review of case ,  Patient will not be taken back by provider for [facility] due to non compliance.  Spoke with Sheral Flow at [facility] who stated that they will not take patient back ( had patient previously for [facility]) due to non compliance issues will  Attempt to find agency that will accept.

## 2015-11-25 ENCOUNTER — Encounter (HOSPITAL_COMMUNITY): Payer: Self-pay | Admitting: Emergency Medicine

## 2015-11-25 ENCOUNTER — Inpatient Hospital Stay (HOSPITAL_COMMUNITY)
Admission: EM | Admit: 2015-11-25 | Discharge: 2015-11-27 | DRG: 293 | Disposition: A | Discharge status: Home Health | Payer: Medicare Other | Attending: Internal Medicine | Admitting: Internal Medicine

## 2015-11-25 ENCOUNTER — Emergency Department (HOSPITAL_COMMUNITY): Payer: Medicare Other

## 2015-11-25 DIAGNOSIS — I482 Chronic atrial fibrillation, unspecified: Secondary | ICD-10-CM | POA: Diagnosis present

## 2015-11-25 DIAGNOSIS — Z6838 Body mass index (BMI) 38.0-38.9, adult: Secondary | ICD-10-CM

## 2015-11-25 DIAGNOSIS — G4733 Obstructive sleep apnea (adult) (pediatric): Secondary | ICD-10-CM | POA: Diagnosis present

## 2015-11-25 DIAGNOSIS — R609 Edema, unspecified: Secondary | ICD-10-CM | POA: Diagnosis present

## 2015-11-25 DIAGNOSIS — I5032 Chronic diastolic (congestive) heart failure: Secondary | ICD-10-CM | POA: Diagnosis present

## 2015-11-25 DIAGNOSIS — Z91148 Patient's other noncompliance with medication regimen for other reason: Secondary | ICD-10-CM

## 2015-11-25 DIAGNOSIS — D649 Anemia, unspecified: Secondary | ICD-10-CM | POA: Diagnosis present

## 2015-11-25 DIAGNOSIS — E785 Hyperlipidemia, unspecified: Secondary | ICD-10-CM | POA: Diagnosis present

## 2015-11-25 DIAGNOSIS — Y929 Unspecified place or not applicable: Secondary | ICD-10-CM

## 2015-11-25 DIAGNOSIS — Z87891 Personal history of nicotine dependence: Secondary | ICD-10-CM

## 2015-11-25 DIAGNOSIS — Z888 Allergy status to other drugs, medicaments and biological substances status: Secondary | ICD-10-CM

## 2015-11-25 DIAGNOSIS — I110 Hypertensive heart disease with heart failure: Principal | ICD-10-CM | POA: Diagnosis present

## 2015-11-25 DIAGNOSIS — I5023 Acute on chronic systolic (congestive) heart failure: Secondary | ICD-10-CM | POA: Diagnosis not present

## 2015-11-25 DIAGNOSIS — K219 Gastro-esophageal reflux disease without esophagitis: Secondary | ICD-10-CM | POA: Diagnosis present

## 2015-11-25 DIAGNOSIS — Z91018 Allergy to other foods: Secondary | ICD-10-CM

## 2015-11-25 DIAGNOSIS — Z8249 Family history of ischemic heart disease and other diseases of the circulatory system: Secondary | ICD-10-CM

## 2015-11-25 DIAGNOSIS — I509 Heart failure, unspecified: Secondary | ICD-10-CM

## 2015-11-25 DIAGNOSIS — Z79899 Other long term (current) drug therapy: Secondary | ICD-10-CM

## 2015-11-25 DIAGNOSIS — T501X6A Underdosing of loop [high-ceiling] diuretics, initial encounter: Secondary | ICD-10-CM | POA: Diagnosis present

## 2015-11-25 DIAGNOSIS — J45909 Unspecified asthma, uncomplicated: Secondary | ICD-10-CM | POA: Diagnosis present

## 2015-11-25 DIAGNOSIS — R0601 Orthopnea: Secondary | ICD-10-CM | POA: Diagnosis present

## 2015-11-25 DIAGNOSIS — Z91128 Patient's intentional underdosing of medication regimen for other reason: Secondary | ICD-10-CM

## 2015-11-25 DIAGNOSIS — R0602 Shortness of breath: Secondary | ICD-10-CM | POA: Diagnosis not present

## 2015-11-25 DIAGNOSIS — Z91030 Bee allergy status: Secondary | ICD-10-CM

## 2015-11-25 DIAGNOSIS — Z9114 Patient's other noncompliance with medication regimen: Secondary | ICD-10-CM

## 2015-11-25 DIAGNOSIS — Z7902 Long term (current) use of antithrombotics/antiplatelets: Secondary | ICD-10-CM

## 2015-11-25 LAB — CBC WITH DIFFERENTIAL/PLATELET
Basophils Absolute: 0 10*3/uL (ref 0.0–0.1)
Basophils Relative: 0 %
EOS ABS: 0.1 10*3/uL (ref 0.0–0.7)
EOS PCT: 2 %
HCT: 36 % — ABNORMAL LOW (ref 39.0–52.0)
Hemoglobin: 11.3 g/dL — ABNORMAL LOW (ref 13.0–17.0)
LYMPHS ABS: 1.6 10*3/uL (ref 0.7–4.0)
LYMPHS PCT: 33 %
MCH: 26.9 pg (ref 26.0–34.0)
MCHC: 31.4 g/dL (ref 30.0–36.0)
MCV: 85.7 fL (ref 78.0–100.0)
MONOS PCT: 6 %
Monocytes Absolute: 0.3 10*3/uL (ref 0.1–1.0)
Neutro Abs: 2.9 10*3/uL (ref 1.7–7.7)
Neutrophils Relative %: 59 %
PLATELETS: 266 10*3/uL (ref 150–400)
RBC: 4.2 MIL/uL — AB (ref 4.22–5.81)
RDW: 14.9 % (ref 11.5–15.5)
WBC: 5 10*3/uL (ref 4.0–10.5)

## 2015-11-25 LAB — BASIC METABOLIC PANEL
Anion gap: 5 (ref 5–15)
BUN: 18 mg/dL (ref 6–20)
CHLORIDE: 105 mmol/L (ref 101–111)
CO2: 29 mmol/L (ref 22–32)
CREATININE: 1.12 mg/dL (ref 0.61–1.24)
Calcium: 8.7 mg/dL — ABNORMAL LOW (ref 8.9–10.3)
GFR calc Af Amer: >60 mL/min (ref >60)
GLUCOSE: 97 mg/dL (ref 65–99)
POTASSIUM: 3.7 mmol/L (ref 3.5–5.1)
Sodium: 139 mmol/L (ref 135–145)

## 2015-11-25 LAB — TROPONIN I
TROPONIN I: 0.03 ng/mL (ref <0.031)
Troponin I: 0.03 ng/mL (ref <0.031)

## 2015-11-25 LAB — BRAIN NATRIURETIC PEPTIDE: B Natriuretic Peptide: 822 pg/mL — ABNORMAL HIGH (ref 0.0–100.0)

## 2015-11-25 LAB — TSH: TSH: 1.261 u[IU]/mL (ref 0.350–4.500)

## 2015-11-25 MED ORDER — FUROSEMIDE 10 MG/ML IJ SOLN
40.0000 mg | Freq: Two times a day (BID) | INTRAMUSCULAR | Status: DC
  Administered 2015-11-25 – 2015-11-27 (×4): 40 mg via INTRAVENOUS
  Filled 2015-11-25 (×4): qty 4

## 2015-11-25 MED ORDER — RIVAROXABAN 20 MG PO TABS
20.0000 mg | ORAL_TABLET | Freq: Every evening | ORAL | Status: DC
Start: 2015-11-25 — End: 2015-11-27
  Administered 2015-11-25 – 2015-11-26 (×2): 20 mg via ORAL
  Filled 2015-11-25 (×2): qty 1

## 2015-11-25 MED ORDER — SPIRONOLACTONE 25 MG PO TABS
25.0000 mg | ORAL_TABLET | Freq: Every day | ORAL | Status: DC
  Administered 2015-11-25 – 2015-11-27 (×3): 25 mg via ORAL
  Filled 2015-11-25 (×3): qty 1

## 2015-11-25 MED ORDER — POTASSIUM CHLORIDE CRYS ER 20 MEQ PO TBCR
20.0000 meq | EXTENDED_RELEASE_TABLET | Freq: Two times a day (BID) | ORAL | Status: DC
  Administered 2015-11-25 – 2015-11-27 (×4): 20 meq via ORAL
  Filled 2015-11-25 (×4): qty 1

## 2015-11-25 MED ORDER — ONDANSETRON HCL 4 MG/2ML IJ SOLN: 4.0000 mg | Freq: Three times a day (TID) | INTRAMUSCULAR | Status: AC | PRN

## 2015-11-25 MED ORDER — BUMETANIDE 0.25 MG/ML IJ SOLN
1.0000 mg | Freq: Once | INTRAMUSCULAR | Status: AC
  Administered 2015-11-25: 1 mg via INTRAVENOUS
  Filled 2015-11-25: qty 4

## 2015-11-25 MED ORDER — METOPROLOL SUCCINATE ER 50 MG PO TB24
75.0000 mg | ORAL_TABLET | Freq: Two times a day (BID) | ORAL | Status: DC
  Administered 2015-11-25 – 2015-11-27 (×4): 75 mg via ORAL
  Filled 2015-11-25 (×5): qty 1

## 2015-11-25 MED ORDER — ACETAMINOPHEN 325 MG PO TABS: 650.0000 mg | ORAL_TABLET | Freq: Four times a day (QID) | ORAL | Status: DC | PRN

## 2015-11-25 MED ORDER — NITROGLYCERIN 2 % TD OINT
0.5000 [in_us] | TOPICAL_OINTMENT | Freq: Three times a day (TID) | TRANSDERMAL | Status: DC
  Administered 2015-11-25 – 2015-11-27 (×5): 0.5 [in_us] via TOPICAL
  Filled 2015-11-25 (×5): qty 1

## 2015-11-25 MED ORDER — SACUBITRIL-VALSARTAN 24-26 MG PO TABS
1.0000 | ORAL_TABLET | Freq: Two times a day (BID) | ORAL | Status: DC
  Administered 2015-11-25 – 2015-11-27 (×4): 1 via ORAL
  Filled 2015-11-25 (×8): qty 1

## 2015-11-25 MED ORDER — SODIUM CHLORIDE 0.9% FLUSH
3.0000 mL | Freq: Two times a day (BID) | INTRAVENOUS | Status: DC
  Administered 2015-11-26 – 2015-11-27 (×2): 3 mL via INTRAVENOUS

## 2015-11-25 MED ORDER — METOPROLOL TARTRATE 5 MG/5ML IV SOLN
2.5000 mg | Freq: Once | INTRAVENOUS | Status: AC
  Administered 2015-11-25: 2.5 mg via INTRAVENOUS
  Filled 2015-11-25: qty 5

## 2015-11-25 MED ORDER — ACETAMINOPHEN 650 MG RE SUPP: 650.0000 mg | Freq: Four times a day (QID) | RECTAL | Status: DC | PRN

## 2015-11-25 MED ORDER — SODIUM CHLORIDE 0.9% FLUSH
3.0000 mL | Freq: Two times a day (BID) | INTRAVENOUS | Status: DC
  Administered 2015-11-25 – 2015-11-27 (×4): 3 mL via INTRAVENOUS

## 2015-11-25 MED ORDER — METOPROLOL TARTRATE 5 MG/5ML IV SOLN
5.0000 mg | Freq: Once | INTRAVENOUS | Status: AC
  Administered 2015-11-25: 5 mg via INTRAVENOUS
  Filled 2015-11-25: qty 5

## 2015-11-25 MED ORDER — SODIUM CHLORIDE 0.9% FLUSH: 3.0000 mL | INTRAVENOUS | Status: DC | PRN

## 2015-11-25 MED ORDER — BECLOMETHASONE DIPROPIONATE 80 MCG/ACT IN AERS
2.0000 | INHALATION_SPRAY | Freq: Every day | RESPIRATORY_TRACT | Status: DC | PRN
Start: 2015-11-25 — End: 2015-11-25

## 2015-11-25 MED ORDER — SODIUM CHLORIDE 0.9 % IV SOLN: 250.0000 mL | INTRAVENOUS | Status: DC | PRN

## 2015-11-25 MED ORDER — BUDESONIDE 0.25 MG/2ML IN SUSP
0.2500 mg | Freq: Two times a day (BID) | RESPIRATORY_TRACT | Status: DC
  Administered 2015-11-25 – 2015-11-27 (×4): 0.25 mg via RESPIRATORY_TRACT
  Filled 2015-11-25 (×4): qty 2

## 2015-11-25 NOTE — ED Notes (Signed)
Pt c/o sob and feeling tired since yesterday.

## 2015-11-25 NOTE — ED Notes (Signed)
Report given to Barbara Cower, RN at this time for room 340.

## 2015-11-25 NOTE — ED Notes (Signed)
MD at bedside. 

## 2015-11-25 NOTE — ED Notes (Signed)
Pt ambulated from waiting room with no difficulties noted.

## 2015-11-25 NOTE — H&P (Signed)
TRH H&P   Patient Demographics:    [name], is a 50 y.o. male  MRN: [id number]   DOB - 08-02-1965  Admit Date - 11/25/2015  Outpatient Primary MD for the patient is No PCP Per Patient   Syliva Overman ?  Referring MD/NP/PA:  Eber Hong  Outpatient Specialists: cardiology in [place]  Patient coming from: home  Chief Complaint  Patient presents with  . Shortness of Breath      HPI:    [name]  is a 50 y.o. male, with Chronic afib, (CHADS2VASC = 2),CHF (EF 20-25%),  OSA, hx of medication noncompliance, w/ recent admission for CHF, apparently c/o dyspnea, and lower ext edema. + orthopnea.  + pnd,  Can't sleep lying down due to cough.  Pt denies cp, palp, n/v, diarrhea.  Pt presented to ED for evaluation. CXR showed CHF.   Pt will be admitted for CHF due to medication noncompliance, states didn't take bumex. => due to leg swelling, as well as eating salty food.      Review of systems:    In addition to the HPI above, No Fever-chills, No Headache, No changes with Vision or hearing, No problems swallowing food or Liquids, No Chest pain, Cough  No Abdominal pain, No Nausea or Vommitting, Bowel movements are regular, No Blood in stool or Urine, No dysuria, No new skin rashes or bruises, No new joints pains-aches,  No new weakness, tingling, numbness in any extremity, No recent weight gain or loss, No polyuria, polydypsia or polyphagia, No significant Mental Stressors.  A full 10 point Review of Systems was done, except as stated above, all other Review of Systems were negative.   With Past History of the following :    Past Medical History  Diagnosis Date  . Hypertensive heart disease     Hypertensive heart disease with prior episodes of CHF  . Hyperlipidemia   . Hypertension   . Gastroesophageal reflux disease   . Osteoarthritis   . Peptic  ulcer disease   . Morbid obesity (HCC)     /sleep apnea  . Allergic rhinitis     plus h/o asthma  . Tobacco abuse, in remission     Cigarettes discontinued in 2010  . CHF (congestive heart failure) (HCC)     EF20-25%  . A-fib (HCC)     2015  . Asthma   . Noncompliance   . Pulmonary hypertension (HCC) 09/19/2015  . Chronic diastolic CHF (congestive heart failure) [facility])       Past Surgical History  Procedure Laterality Date  . Cardiac catheterization N/A 06/14/2015    Procedure: Right/Left Heart Cath and Coronary Angiography;  Surgeon: Runell Gess, MD;  Location: [facility] INVASIVE CV LAB;  Service: Cardiovascular;  Laterality: N/A;      Social History:     Social History  Substance Use Topics  . Smoking  status: Former Smoker -- 0.50 packs/day for 20 years    Types: Cigarettes    Start date: 04/26/1988    Quit date: 06/23/2007  . Smokeless tobacco: Never Used     Comment: 1 ppd former smoker  . Alcohol Use: No     Comment: no etoh since 2009     Lives -  At home  Mobility -  Without assistance   Family History :     Family History  Problem Relation Age of Onset  . Colon cancer Neg Hx   . Inflammatory bowel disease Neg Hx   . Liver disease Neg Hx   . Stroke Father   . Heart attack Father        Home Medications:   Prior to Admission medications   Medication Sig Start Date End Date Taking? Authorizing Provider  beclomethasone (QVAR) 80 MCG/ACT inhaler Inhale 2 puffs into the lungs daily as needed (shortness of breath). 11/21/15  Yes Erick Blinks, MD  bumetanide (BUMEX) 1 MG tablet Take 1 tablet (1 mg total) by mouth 2 (two) times daily. 11/21/15  Yes Erick Blinks, MD  KLOR-CON M20 20 MEQ tablet Take 1 tablet (20 mEq total) by mouth 2 (two) times daily. 11/21/15  Yes Erick Blinks, MD  losartan (COZAAR) 100 MG tablet Take 1 tablet (100 mg total) by mouth daily. 11/21/15  Yes Erick Blinks, MD  metoprolol succinate (TOPROL-XL) 25 MG 24 hr tablet Take 3 tablets (75  mg total) by mouth 2 (two) times daily. Take with or immediately following a meal. 11/21/15  Yes Erick Blinks, MD  rivaroxaban (XARELTO) 20 MG TABS tablet Take 1 tablet (20 mg total) by mouth every evening. 11/21/15  Yes Erick Blinks, MD  spironolactone (ALDACTONE) 25 MG tablet Take 1 tablet (25 mg total) by mouth daily. 11/21/15  Yes Erick Blinks, MD  torsemide (DEMADEX) 20 MG tablet Take 20 mg by mouth daily.  11/12/15  Yes Historical Provider, MD     Allergies:     Allergies  Allergen Reactions  . Bee Venom Shortness Of Breath and Swelling  . Lipitor [Atorvastatin] Shortness Of Breath and Other (See Comments)    Nose bleed  . Aspirin Other (See Comments)    Chewable 81 mg tablets upsets patients stomach  . Banana Diarrhea  . Diltiazem Other (See Comments)    Makes heart beat fast  . Hydralazine Other (See Comments)    Patient states it causes Tachycardia     Physical Exam:   Vitals  Blood pressure 145/110, pulse 96, temperature 98.5 F (36.9 C), temperature source Oral, resp. rate 14, height 6' (1.829 m), weight 135.626 kg (299 lb), SpO2 90 %.   1. General  lying in bed in NAD,    2. Normal affect and insight, Not Suicidal or Homicidal, Awake Alert, Oriented X 3.  3. No F.N deficits, ALL C.Nerves Intact, Strength 5/5 all 4 extremities, Sensation intact all 4 extremities, Plantars down going.  4. Ears and Eyes appear Normal, Conjunctivae clear, PERRLA. Moist Oral Mucosa.  5. Supple Neck, + JVD, No cervical lymphadenopathy appriciated, No Carotid Bruits.  6. Symmetrical Chest wall movement, Good air movement bilaterally, slight crackles bilateral base, no wheeze  7. Irr, irr s1, s2, , No Gallops, Rubs or Murmurs, No Parasternal Heave.  8. Positive Bowel Sounds, Abdomen Soft, No tenderness, No organomegaly appriciated,No rebound -guarding or rigidity.  9.  No Cyanosis, Normal Skin Turgor, No Skin Rash or Bruise. 1= edema  10. Good muscle tone,  joints appear normal ,  no effusions, Normal ROM.  11. No Palpable Lymph Nodes in Neck or Axillae     Data Review:    CBC  Recent Labs Lab 11/25/15 1306  WBC 5.0  HGB 11.3*  HCT 36.0*  PLT 266  MCV 85.7  MCH 26.9  MCHC 31.4  RDW 14.9  LYMPHSABS 1.6  MONOABS 0.3  EOSABS 0.1  BASOSABS 0.0   ------------------------------------------------------------------------------------------------------------------  Chemistries   Recent Labs Lab 11/19/15 0556 11/20/15 0551 11/25/15 1306  NA 142 141 139  K 3.7 3.4* 3.7  CL 106 108 105  CO2 GLUCOSE 82 82 97  BUN 27* 27* 18  CREATININE 1.30* 1.11 1.12  CALCIUM 8.9 8.6* 8.7*   ------------------------------------------------------------------------------------------------------------------ estimated creatinine clearance is 116.3 mL/min (by C-G formula based on Cr of 1.12). ------------------------------------------------------------------------------------------------------------------ No results for input(s): TSH, T4TOTAL, T3FREE, THYROIDAB in the last 72 hours.  Invalid input(s): FREET3  Coagulation profile No results for input(s): INR, PROTIME in the last 168 hours. ------------------------------------------------------------------------------------------------------------------- No results for input(s): DDIMER in the last 72 hours. -------------------------------------------------------------------------------------------------------------------  Cardiac Enzymes  Recent Labs Lab 11/25/15 1306  TROPONINI 0.03   ------------------------------------------------------------------------------------------------------------------    Component Value Date/Time   BNP 822.0* 11/25/2015 1254     ---------------------------------------------------------------------------------------------------------------  Urinalysis    Component Value Date/Time   COLORURINE YELLOW 10/29/2015 1012   APPEARANCEUR CLEAR 10/29/2015 1012   LABSPEC  1.019 10/29/2015 1012   PHURINE 6.0 10/29/2015 1012   GLUCOSEU NEGATIVE 10/29/2015 1012   GLUCOSEU NEG mg/dL 40/98/1191 4782   HGBUR NEGATIVE 10/29/2015 1012   BILIRUBINUR NEGATIVE 10/29/2015 1012   KETONESUR NEGATIVE 10/29/2015 1012   PROTEINUR 30* 10/29/2015 1012   UROBILINOGEN 0.2 08/08/2014 1445   NITRITE NEGATIVE 10/29/2015 1012   LEUKOCYTESUR NEGATIVE 10/29/2015 1012    ----------------------------------------------------------------------------------------------------------------   Imaging Results:    Dg Chest 2 View  11/25/2015  CLINICAL DATA:  Shortness of breath EXAM: CHEST  2 VIEW COMPARISON:  Nov 17, 2015 FINDINGS: Stable cardiomegaly. Mild a moderate pulmonary edema has improved since Nov 17, 2015. No pneumothorax. No other interval changes. IMPRESSION: Persistent but improving pulmonary edema.  Cardiomegaly. Electronically Signed   By: Gerome Sam III M.D   On: 11/25/2015 13:27    My personal review of EKG: RhythmAfib at 130 no Acute ST changes   Assessment & Plan:    Principal Problem:   CHF (congestive heart failure) (HCC) Active Problems:   Noncompliance with medication regimen   Chronic atrial fibrillation (HCC)   OSA (obstructive sleep apnea)    1. CHF (EF 20-25%) Mild, secondary to noncompliance with medication,   Trop i q6h x3 Check daily, strict i and o Start lasix  iv bid Start on entresto bid.  D/c Losartan  2. Afib (chronic) Cont metoprolol, cont xarelto  3. Anemia Repeat cbc in am  4.  Hypertension Cont current medication   5. OSA cpap per respiratory therapy    DVT Prophylaxis on xarelto  AM Labs Ordered, also please review Full Orders  Family Communication: Admission, patients condition and plan of care including tests being ordered have been discussed with the patient  who indicate understanding and agree with the plan and Code Status.  Code Status FULL CODE  Likely DC to  home  Condition GUARDED    Consults  called: none  Admission status:  observation   Time spent in minutes : 45   Pearson Grippe M.D on 11/25/2015 at 5:14 PM  Between 7am to  7pm - Pager - [phone]. After 7pm go to [email or web address] - password [facility]  [facility] - Office  [phone]

## 2015-11-25 NOTE — ED Provider Notes (Signed)
CSN: [id number]     Arrival date & time 11/25/15  1244 History   First MD Initiated Contact with Patient 11/25/15 1345     Chief Complaint  Patient presents with  . Shortness of Breath     (Consider location/radiation/quality/duration/timing/severity/associated sxs/prior Treatment) HPI  The patient is a 50 year old male, he has severe congestive heart failure, he has been thought to be noncompliant with his medications leading to recurrent admissions to the hospital for fluid overload state. He was just released on June 1 after being diuresed in the hospital and started on bumetanide which the patient states he has been taking outpatient but has had persistent and significant swelling of his lower extremities as well as increased dyspnea on exertion and orthopnea. The symptoms are persistent, gradually worsening, they have now become severe and are associated with a rapid heartbeat. He feels as though he is having palpitations denies chest pain fever or coughing. Echocardiogram performed recently as below  Impressions:  - Compared to a prior study in 05/2015, the LVEF has declined to  20-25% with global hypokinesis. A-fib is noted to be present.  Past Medical History  Diagnosis Date  . Hypertensive heart disease     Hypertensive heart disease with prior episodes of CHF  . Hyperlipidemia   . Hypertension   . Gastroesophageal reflux disease   . Osteoarthritis   . Peptic ulcer disease   . Morbid obesity (HCC)     /sleep apnea  . Allergic rhinitis     plus h/o asthma  . Tobacco abuse, in remission     Cigarettes discontinued in 2010  . CHF (congestive heart failure) (HCC)     EF20-25%  . A-fib (HCC)     2015  . Asthma   . Noncompliance   . Pulmonary hypertension (HCC) 09/19/2015  . Chronic diastolic CHF (congestive heart failure) [facility])    Past Surgical History  Procedure Laterality Date  . Cardiac catheterization N/A 06/14/2015    Procedure: Right/Left Heart Cath and  Coronary Angiography;  Surgeon: Runell Gess, MD;  Location: [facility] INVASIVE CV LAB;  Service: Cardiovascular;  Laterality: N/A;   Family History  Problem Relation Age of Onset  . Colon cancer Neg Hx   . Inflammatory bowel disease Neg Hx   . Liver disease Neg Hx   . Stroke Father   . Heart attack Father    Social History  Substance Use Topics  . Smoking status: Former Smoker -- 0.50 packs/day for 20 years    Types: Cigarettes    Start date: 04/26/1988    Quit date: 06/23/2007  . Smokeless tobacco: Never Used     Comment: 1 ppd former smoker  . Alcohol Use: No     Comment: no etoh since 2009    Review of Systems  All other systems reviewed and are negative.     Allergies  Bee venom; Lipitor; Aspirin; Banana; Diltiazem; and Hydralazine  Home Medications   Prior to Admission medications   Medication Sig Start Date End Date Taking? Authorizing Provider  beclomethasone (QVAR) 80 MCG/ACT inhaler Inhale 2 puffs into the lungs daily as needed (shortness of breath). 11/21/15  Yes Erick Blinks, MD  bumetanide (BUMEX) 1 MG tablet Take 1 tablet (1 mg total) by mouth 2 (two) times daily. 11/21/15  Yes Erick Blinks, MD  KLOR-CON M20 20 MEQ tablet Take 1 tablet (20 mEq total) by mouth 2 (two) times daily. 11/21/15  Yes Erick Blinks, MD  losartan (COZAAR) 100 MG tablet  Take 1 tablet (100 mg total) by mouth daily. 11/21/15  Yes Erick Blinks, MD  metoprolol succinate (TOPROL-XL) 25 MG 24 hr tablet Take 3 tablets (75 mg total) by mouth 2 (two) times daily. Take with or immediately following a meal. 11/21/15  Yes Erick Blinks, MD  rivaroxaban (XARELTO) 20 MG TABS tablet Take 1 tablet (20 mg total) by mouth every evening. 11/21/15  Yes Erick Blinks, MD  spironolactone (ALDACTONE) 25 MG tablet Take 1 tablet (25 mg total) by mouth daily. 11/21/15  Yes Erick Blinks, MD  torsemide (DEMADEX) 20 MG tablet Take 20 mg by mouth daily.  11/12/15  Yes Historical Provider, MD   BP 145/110 mmHg  Pulse 96   Temp(Src) 98.5 F (36.9 C) (Oral)  Resp 14  Ht 6' (1.829 m)  Wt 299 lb (135.626 kg)  BMI 40.54 kg/m2  SpO2 90% Physical Exam  Constitutional: He appears well-developed and well-nourished. No distress.  HENT:  Head: Normocephalic and atraumatic.  Mouth/Throat: Oropharynx is clear and moist. No oropharyngeal exudate.  Eyes: Conjunctivae and EOM are normal. Pupils are equal, round, and reactive to light. Right eye exhibits no discharge. Left eye exhibits no discharge. No scleral icterus.  Neck: Normal range of motion. Neck supple. No JVD present. No thyromegaly present.  Cardiovascular: Normal rate, regular rhythm, normal heart sounds and intact distal pulses.  Exam reveals no gallop and no friction rub.   No murmur heard. Pulmonary/Chest: Effort normal. No respiratory distress. He has no wheezes. He has rales ( soft rales at bases).  Abdominal: Soft. Bowel sounds are normal. He exhibits no distension and no mass. There is no tenderness.  Musculoskeletal: Normal range of motion. He exhibits edema ( severe bilateral edema R > L.  pitting, no erythema.). He exhibits no tenderness.  Lymphadenopathy:    He has no cervical adenopathy.  Neurological: He is alert. Coordination normal.  Skin: Skin is warm and dry. No rash noted. No erythema.  Psychiatric: He has a normal mood and affect. His behavior is normal.  Nursing note and vitals reviewed.   ED Course  Procedures (including critical care time) Labs Review Labs Reviewed  CBC WITH DIFFERENTIAL/PLATELET - Abnormal; Notable for the following:    RBC 4.20 (*)    Hemoglobin 11.3 (*)    HCT 36.0 (*)    All other components within normal limits  BASIC METABOLIC PANEL - Abnormal; Notable for the following:    Calcium 8.7 (*)    All other components within normal limits  BRAIN NATRIURETIC PEPTIDE - Abnormal; Notable for the following:    B Natriuretic Peptide 822.0 (*)    All other components within normal limits  TROPONIN I    Imaging  Review Dg Chest 2 View  11/25/2015  CLINICAL DATA:  Shortness of breath EXAM: CHEST  2 VIEW COMPARISON:  Nov 17, 2015 FINDINGS: Stable cardiomegaly. Mild a moderate pulmonary edema has improved since Nov 17, 2015. No pneumothorax. No other interval changes. IMPRESSION: Persistent but improving pulmonary edema.  Cardiomegaly. Electronically Signed   By: Gerome Sam III M.D   On: 11/25/2015 13:27   I have personally reviewed and evaluated these images and lab results as part of my medical decision-making.   EKG Interpretation   Date/Time:  Monday November 25 2015 12:54:14 EDT Ventricular Rate:  128 PR Interval:    QRS Duration: 90 QT Interval:  344 QTC Calculation: 502 R Axis:   47 Text Interpretation:  Atrial fibrillation with rapid ventricular response  with  premature ventricular or aberrantly conducted complexes Nonspecific T  wave abnormality Abnormal ECG Since last tracing rate faster Confirmed by  Pookela Sellin  MD, Corlene Sabia ([id number]) on 11/25/2015 1:46:18 PM      MDM   Final diagnoses:  Acute on chronic systolic congestive heart failure (HCC)    The pt has signs of Afib - rate is rapid and irregular and his LE's are swlllen His xray shows that there is ongoing pulmonary edema which is not surprising given his history and very poor EF. Will give dose of budesomide and consider admission. ECG shows ongoing afib - not signficiantly changes. Pt is supposed to be on BB and xarelto and diuresis.  D/w Dr. Selena Batten - he will admit to tele obs for diuresis D/w Dr. Darl Householder who states that the pt has been d/c from the practice  Medications  metoprolol (LOPRESSOR) injection 5 mg (5 mg Intravenous Given 11/25/15 1444)  bumetanide (BUMEX) injection 1 mg (1 mg Intravenous Given 11/25/15 1614)     Eber Hong, MD 11/25/15 [phone]

## 2015-11-25 NOTE — Progress Notes (Signed)
Patient has CPAP ordered but does not want to wear one. Patient placed on [facility] for the night. RT will continue to monitor.

## 2015-11-26 DIAGNOSIS — R0602 Shortness of breath: Secondary | ICD-10-CM | POA: Diagnosis present

## 2015-11-26 DIAGNOSIS — D649 Anemia, unspecified: Secondary | ICD-10-CM | POA: Diagnosis present

## 2015-11-26 DIAGNOSIS — I5020 Unspecified systolic (congestive) heart failure: Secondary | ICD-10-CM

## 2015-11-26 DIAGNOSIS — I5021 Acute systolic (congestive) heart failure: Secondary | ICD-10-CM | POA: Diagnosis not present

## 2015-11-26 DIAGNOSIS — Z6838 Body mass index (BMI) 38.0-38.9, adult: Secondary | ICD-10-CM | POA: Diagnosis not present

## 2015-11-26 DIAGNOSIS — Z8249 Family history of ischemic heart disease and other diseases of the circulatory system: Secondary | ICD-10-CM | POA: Diagnosis not present

## 2015-11-26 DIAGNOSIS — I482 Chronic atrial fibrillation: Secondary | ICD-10-CM

## 2015-11-26 DIAGNOSIS — Z7902 Long term (current) use of antithrombotics/antiplatelets: Secondary | ICD-10-CM | POA: Diagnosis not present

## 2015-11-26 DIAGNOSIS — R0601 Orthopnea: Secondary | ICD-10-CM | POA: Diagnosis present

## 2015-11-26 DIAGNOSIS — R609 Edema, unspecified: Secondary | ICD-10-CM | POA: Diagnosis present

## 2015-11-26 DIAGNOSIS — Z91030 Bee allergy status: Secondary | ICD-10-CM | POA: Diagnosis not present

## 2015-11-26 DIAGNOSIS — Z888 Allergy status to other drugs, medicaments and biological substances status: Secondary | ICD-10-CM | POA: Diagnosis not present

## 2015-11-26 DIAGNOSIS — J45909 Unspecified asthma, uncomplicated: Secondary | ICD-10-CM | POA: Diagnosis present

## 2015-11-26 DIAGNOSIS — Z9114 Patient's other noncompliance with medication regimen: Secondary | ICD-10-CM

## 2015-11-26 DIAGNOSIS — I110 Hypertensive heart disease with heart failure: Secondary | ICD-10-CM | POA: Diagnosis present

## 2015-11-26 DIAGNOSIS — Z91128 Patient's intentional underdosing of medication regimen for other reason: Secondary | ICD-10-CM | POA: Diagnosis not present

## 2015-11-26 DIAGNOSIS — Z87891 Personal history of nicotine dependence: Secondary | ICD-10-CM | POA: Diagnosis not present

## 2015-11-26 DIAGNOSIS — K219 Gastro-esophageal reflux disease without esophagitis: Secondary | ICD-10-CM | POA: Diagnosis present

## 2015-11-26 DIAGNOSIS — Z91018 Allergy to other foods: Secondary | ICD-10-CM | POA: Diagnosis not present

## 2015-11-26 DIAGNOSIS — Y929 Unspecified place or not applicable: Secondary | ICD-10-CM | POA: Diagnosis not present

## 2015-11-26 DIAGNOSIS — I5032 Chronic diastolic (congestive) heart failure: Secondary | ICD-10-CM | POA: Diagnosis present

## 2015-11-26 DIAGNOSIS — Z79899 Other long term (current) drug therapy: Secondary | ICD-10-CM | POA: Diagnosis not present

## 2015-11-26 DIAGNOSIS — T501X6A Underdosing of loop [high-ceiling] diuretics, initial encounter: Secondary | ICD-10-CM | POA: Diagnosis present

## 2015-11-26 DIAGNOSIS — G4733 Obstructive sleep apnea (adult) (pediatric): Secondary | ICD-10-CM | POA: Diagnosis not present

## 2015-11-26 DIAGNOSIS — E785 Hyperlipidemia, unspecified: Secondary | ICD-10-CM | POA: Diagnosis present

## 2015-11-26 LAB — COMPREHENSIVE METABOLIC PANEL
ALT: 20 U/L (ref 17–63)
AST: 22 U/L (ref 15–41)
Albumin: 3.5 g/dL (ref 3.5–5.0)
Alkaline Phosphatase: 59 U/L (ref 38–126)
Anion gap: 5 (ref 5–15)
BUN: 16 mg/dL (ref 6–20)
CHLORIDE: 106 mmol/L (ref 101–111)
CO2: 27 mmol/L (ref 22–32)
CREATININE: 1.05 mg/dL (ref 0.61–1.24)
Calcium: 8.5 mg/dL — ABNORMAL LOW (ref 8.9–10.3)
GFR calc Af Amer: >60 mL/min (ref >60)
Glucose, Bld: 87 mg/dL (ref 65–99)
Potassium: 3.5 mmol/L (ref 3.5–5.1)
Sodium: 138 mmol/L (ref 135–145)
Total Bilirubin: 2 mg/dL — ABNORMAL HIGH (ref 0.3–1.2)
Total Protein: 6.4 g/dL — ABNORMAL LOW (ref 6.5–8.1)

## 2015-11-26 LAB — CBC
HCT: 33 % — ABNORMAL LOW (ref 39.0–52.0)
Hemoglobin: 10.5 g/dL — ABNORMAL LOW (ref 13.0–17.0)
MCH: 27.3 pg (ref 26.0–34.0)
MCHC: 31.8 g/dL (ref 30.0–36.0)
MCV: 85.7 fL (ref 78.0–100.0)
PLATELETS: 226 10*3/uL (ref 150–400)
RBC: 3.85 MIL/uL — ABNORMAL LOW (ref 4.22–5.81)
RDW: 15 % (ref 11.5–15.5)
WBC: 4 10*3/uL (ref 4.0–10.5)

## 2015-11-26 LAB — TROPONIN I
Troponin I: 0.03 ng/mL (ref <0.031)
Troponin I: 0.03 ng/mL (ref <0.031)

## 2015-11-26 NOTE — Care Management Obs Status (Signed)
MEDICARE OBSERVATION STATUS NOTIFICATION   Patient Details  Name: [name] MRN: [id number] Date of Birth: 1965/12/07   Medicare Observation Status Notification Given:  Yes    Bronislaw Switzer, [name], RN 11/26/2015, 3:23 PM

## 2015-11-26 NOTE — Progress Notes (Signed)
[facility] PROGRESS NOTE  YASEEN GILBERG ZOX:[id number] DOB: 1966/01/17    PCP:   No PCP Per Patient   HPI: [name] is an 50 y.o. male with hx of severe medical non-compliance, severe combined CHF, afib on Xarelto, discharged from cardiology office, recurrent admission for SOB and CHF, readmitted yesterday for same.  Cardiology saw him and consultation appreciated.  He is doing better.  He is getting IV Lasix.   Rewiew of Systems:  Constitutional: Negative for malaise, fever and chills. No significant weight loss or weight gain Eyes: Negative for eye pain, redness and discharge, diplopia, visual changes, or flashes of light. ENMT: Negative for ear pain, hoarseness, nasal congestion, sinus pressure and sore throat. No headaches; tinnitus, drooling, or problem swallowing. Cardiovascular: Negative for chest pain, palpitations, diaphoresis, dyspnea and peripheral edema. ; No orthopnea, PND Respiratory: Negative for cough, hemoptysis, wheezing and stridor. No pleuritic chestpain. Gastrointestinal: Negative for nausea, vomiting, diarrhea, constipation, abdominal pain, melena, blood in stool, hematemesis, jaundice and rectal bleeding.    Genitourinary: Negative for frequency, dysuria, incontinence,flank pain and hematuria; Musculoskeletal: Negative for back pain and neck pain. Negative for swelling and trauma.;  Skin: . Negative for pruritus, rash, abrasions, bruising and skin lesion.; ulcerations Neuro: Negative for headache, lightheadedness and neck stiffness. Negative for weakness, altered level of consciousness , altered mental status, extremity weakness, burning feet, involuntary movement, seizure and syncope.  Psych: negative for anxiety, depression, insomnia, tearfulness, panic attacks, hallucinations, paranoia, suicidal or homicidal ideation    Past Medical History  Diagnosis Date  . Hypertensive heart disease     Hypertensive heart disease with prior episodes of CHF  .  Hyperlipidemia   . Hypertension   . Gastroesophageal reflux disease   . Osteoarthritis   . Peptic ulcer disease   . Morbid obesity (HCC)     /sleep apnea  . Allergic rhinitis     plus h/o asthma  . Tobacco abuse, in remission     Cigarettes discontinued in 2010  . CHF (congestive heart failure) (HCC)     EF20-25%  . A-fib (HCC)     2015  . Asthma   . Noncompliance   . Pulmonary hypertension (HCC) 09/19/2015  . Chronic diastolic CHF (congestive heart failure) [facility])     Past Surgical History  Procedure Laterality Date  . Cardiac catheterization N/A 06/14/2015    Procedure: Right/Left Heart Cath and Coronary Angiography;  Surgeon: Runell Gess, MD;  Location: [facility] INVASIVE CV LAB;  Service: Cardiovascular;  Laterality: N/A;    Medications:  HOME MEDS: Prior to Admission medications   Medication Sig Start Date End Date Taking? Authorizing Provider  beclomethasone (QVAR) 80 MCG/ACT inhaler Inhale 2 puffs into the lungs daily as needed (shortness of breath). 11/21/15  Yes Erick Blinks, MD  bumetanide (BUMEX) 1 MG tablet Take 1 tablet (1 mg total) by mouth 2 (two) times daily. 11/21/15  Yes Erick Blinks, MD  KLOR-CON M20 20 MEQ tablet Take 1 tablet (20 mEq total) by mouth 2 (two) times daily. 11/21/15  Yes Erick Blinks, MD  losartan (COZAAR) 100 MG tablet Take 1 tablet (100 mg total) by mouth daily. 11/21/15  Yes Erick Blinks, MD  metoprolol succinate (TOPROL-XL) 25 MG 24 hr tablet Take 3 tablets (75 mg total) by mouth 2 (two) times daily. Take with or immediately following a meal. 11/21/15  Yes Erick Blinks, MD  rivaroxaban (XARELTO) 20 MG TABS tablet Take 1 tablet (20 mg total) by mouth every evening. 11/21/15  Yes Erick Blinks, MD  spironolactone (ALDACTONE) 25 MG tablet Take 1 tablet (25 mg total) by mouth daily. 11/21/15  Yes Erick Blinks, MD  torsemide (DEMADEX) 20 MG tablet Take 20 mg by mouth daily.  11/12/15  Yes Historical Provider, MD     Allergies:  Allergies   Allergen Reactions  . Bee Venom Shortness Of Breath and Swelling  . Lipitor [Atorvastatin] Shortness Of Breath and Other (See Comments)    Nose bleed  . Aspirin Other (See Comments)    Chewable 81 mg tablets upsets patients stomach  . Banana Diarrhea  . Diltiazem Other (See Comments)    Makes heart beat fast  . Hydralazine Other (See Comments)    Patient states it causes Tachycardia    Social History:   reports that he quit smoking about 8 years ago. His smoking use included Cigarettes. He started smoking about 27 years ago. He has a 10 pack-year smoking history. He has never used smokeless tobacco. He reports that he does not drink alcohol or use illicit drugs.  Family History: Family History  Problem Relation Age of Onset  . Colon cancer Neg Hx   . Inflammatory bowel disease Neg Hx   . Liver disease Neg Hx   . Stroke Father   . Heart attack Father      Physical Exam: Filed Vitals:   11/25/15 2048 11/25/15 2108 11/25/15 2113 11/26/15 0624  BP:  171/97 126/87 129/96  Pulse:  72 83 90  Temp:  98.7 F (37.1 C)  98.7 F (37.1 C)  TempSrc:  Oral  Oral  Resp:  20  20  Height:      Weight:    132.768 kg (292 lb 11.2 oz)  SpO2: 98% 95% 100% 99%   Blood pressure 129/96, pulse 90, temperature 98.7 F (37.1 C), temperature source Oral, resp. rate 20, height 6' (1.829 m), weight 132.768 kg (292 lb 11.2 oz), SpO2 99 %.  GEN:  Pleasant  patient lying in the stretcher in no acute distress; cooperative with exam. PSYCH:  alert and oriented x4; does not appear anxious or depressed; affect is appropriate. HEENT: Mucous membranes pink and anicteric; PERRLA; EOM intact; no cervical lymphadenopathy nor thyromegaly or carotid bruit; no JVD; There were no stridor. Neck is very supple. Breasts:: Not examined CHEST WALL: No tenderness CHEST: Normal respiration, clear to auscultation bilaterally.  HEART: Regular rate and rhythm.  There are no murmur, rub, or gallops.   BACK: No kyphosis  or scoliosis; no CVA tenderness ABDOMEN: soft and non-tender; no masses, no organomegaly, normal abdominal bowel sounds; no pannus; no intertriginous candida. There is no rebound and no distention. Rectal Exam: Not done EXTREMITIES: No bone or joint deformity; age-appropriate arthropathy of the hands and knees; no edema; no ulcerations.  There is no calf tenderness. Genitalia: not examined PULSES: 2+ and symmetric SKIN: Normal hydration no rash or ulceration CNS: Cranial nerves 2-12 grossly intact no focal lateralizing neurologic deficit.  Speech is fluent; uvula elevated with phonation, facial symmetry and tongue midline. DTR are normal bilaterally, cerebella exam is intact, barbinski is negative and strengths are equaled bilaterally.  No sensory loss.   Labs on Admission:  Basic Metabolic Panel:  Recent Labs Lab 11/20/15 0551 11/25/15 1306 11/26/15 0156  NA 141 139 138  K 3.4* 3.7 3.5  CL 108 105 106  CO2 25 29 27   GLUCOSE 82 97 87  BUN 27* 18 16  CREATININE 1.11 1.12 1.05  CALCIUM 8.6* 8.7* 8.5*  Liver Function Tests:  Recent Labs Lab 11/26/15 0156  AST 22  ALT 20  ALKPHOS 59  BILITOT 2.0*  PROT 6.4*  ALBUMIN 3.5   CBC:  Recent Labs Lab 11/25/15 1306 11/26/15 0156  WBC 5.0 4.0  NEUTROABS 2.9  --   HGB 11.3* 10.5*  HCT 36.0* 33.0*  MCV 85.7 85.7  PLT 266 226   Cardiac Enzymes:  Recent Labs Lab 11/25/15 1306 11/25/15 1943 11/26/15 0156 11/26/15 0656  TROPONINI 0.03 0.03 0.03 0.03    Radiological Exams on Admission: Dg Chest 2 View  11/25/2015  CLINICAL DATA:  Shortness of breath EXAM: CHEST  2 VIEW COMPARISON:  Nov 17, 2015 FINDINGS: Stable cardiomegaly. Mild a moderate pulmonary edema has improved since Nov 17, 2015. No pneumothorax. No other interval changes. IMPRESSION: Persistent but improving pulmonary edema.  Cardiomegaly. Electronically Signed   By: Gerome Sam III M.D   On: 11/25/2015 13:27    EKG: Independently  reviewed.   Assessment/Plan Present on Admission:  . OSA (obstructive sleep apnea) . Chronic atrial fibrillation (HCC) Combined CHF Noncompliance.  PLAN:  Severe combined CHF:  Management is difficult due to non compliance, and has no local cardiology follow up.  Will continue with diuresed today.  I discussed this dilemma with him, and he said he " I get it ".   Discharge should be on oral Lasix instead of Bumex or Demadex given his non compliance with this particular med.   Afib:  Rate controlled.  Continue with anticoagulation.   HTN:  Continue with meds.  OSA:  Cpap Q hs.    Other plans as per orders. Code Status: FULL Unk Lightning, MD.  FACP [facility] Pager [phone] 7pm to 7am.  11/26/2015, 10:04 AM

## 2015-11-26 NOTE — Care Management Note (Signed)
Case Management Note  Patient Details  Name: [name] MRN: [id number] Date of Birth: 1966/03/28  Subjective/Objective: Patient is from home with family. Patient is independent with ADL's. Patient has [employer] and can afford medications. Patient has been previously set up with PCP, Dr. Selena Batten. Patient has missed appointments in the past. Patient has home health services with [facility] and [facility] health services with [name]. Tim with [name] is aware patient is in the hospital. Patient reports he is taking his medications but that he feels they are not "working" but "putting fluid on " him. Patient has been refused by [facility] and [facility] for non compliance in the past.                   Action/Plan: No CM needs known at this time as patient has home health currently. Will follow.    Expected Discharge Date:        11/27/2015          Expected Discharge Plan:  Home w Home Health Services  In-House Referral:     Discharge planning Services  CM Consult  Post Acute Care Choice:  Home Health, Resumption of Svcs/PTA Provider Choice offered to:     DME Arranged:    DME Agency:     HH Arranged:  RN HH Agency:  [name]  Status of Service:  Completed, signed off  Medicare Important Message Given:    Date Medicare IM Given:    Medicare IM give by:    Date Additional Medicare IM Given:    Additional Medicare Important Message give by:     If discussed at Long Length of Stay Meetings, dates discussed:    Additional Comments:  Treonna Klee, [name], RN 11/26/2015, 3:18 PM

## 2015-11-26 NOTE — Consult Note (Signed)
CARDIOLOGY CONSULT NOTE       Patient ID: [name] MRN: [id number] DOB/AGE: [birth year] 50 y.o.  Admit date: 11/25/2015 Referring Physician: Conley Rolls Primary Physician: No PCP Per Patient Primary Cardiologist:   Reason for Consultation: CHF  Principal Problem:   CHF (congestive heart failure) (HCC) Active Problems:   Noncompliance with medication regimen   Chronic atrial fibrillation (HCC)   OSA (obstructive sleep apnea)   HPI:   [name] is a 50 y.o.  male who had recent hospitalization with acute on chronic CHF due to dietary medical noncompliance. He is re-admtted for same yesterday This is the 3rd time I've seen him in hospital for CHF exacerbation due to non compliance. He should not be on torsemide anymore as he is convinced that it does not work as well as lasix for him but he keeps getting d/c on it. Most recent echo in the hospital 09/30/15 EF was 20-25% with diffuse hypokinesis and severe concentric LVH. This was worse from echo in 05/2015 and felt secondary to hypertensive cardiomyopathy and noncompliance. Patient also has chronic atrial fibrillation controlled with beta blocker and on Xarelto.    Discharge weight was 283 pounds,  [name] today is  292 lbs  Does not really seem to care about his multiple admissions and is asking for more breakfast. No chest pain palpitations or syncope Long discussion about his noncompliance issues but I think he likes the attention at the hospitall  BNP    Component Value Date/Time   BNP 822.0* 11/25/2015 1254      ROS All other systems reviewed and negative except as noted above  Past Medical History  Diagnosis Date  . Hypertensive heart disease     Hypertensive heart disease with prior episodes of CHF  . Hyperlipidemia   . Hypertension   . Gastroesophageal reflux disease   . Osteoarthritis   . Peptic ulcer disease   . Morbid obesity (HCC)     /sleep apnea  . Allergic rhinitis     plus h/o asthma  . Tobacco abuse,  in remission     Cigarettes discontinued in 2010  . CHF (congestive heart failure) (HCC)     EF20-25%  . A-fib (HCC)     2015  . Asthma   . Noncompliance   . Pulmonary hypertension (HCC) 09/19/2015  . Chronic diastolic CHF (congestive heart failure) (HCC)     Family History  Problem Relation Age of Onset  . Colon cancer Neg Hx   . Inflammatory bowel disease Neg Hx   . Liver disease Neg Hx   . Stroke Father   . Heart attack Father     Social History   Social History  . Marital Status: Divorced    Spouse Name: N/A  . Number of Children: 3  . Years of Education: N/A   Occupational History  . disabled    Social History Main Topics  . Smoking status: Former Smoker -- 0.50 packs/day for 20 years    Types: Cigarettes    Start date: 04/26/1988    Quit date: 06/23/2007  . Smokeless tobacco: Never Used     Comment: 1 ppd former smoker  . Alcohol Use: No     Comment: no etoh since 2009  . Drug Use: No  . Sexual Activity: No   Other Topics Concern  . Not on file   Social History Narrative    Past Surgical History  Procedure Laterality Date  . Cardiac catheterization N/A 06/14/2015  Procedure: Right/Left Heart Cath and Coronary Angiography;  Surgeon: Runell Gess, MD;  Location: [facility] INVASIVE CV LAB;  Service: Cardiovascular;  Laterality: N/A;     . budesonide (PULMICORT) nebulizer solution  0.25 mg Nebulization BID  . furosemide  40 mg Intravenous BID  . metoprolol succinate  75 mg Oral BID  . nitroGLYCERIN  0.5 inch Topical Q8H  . potassium chloride SA  20 mEq Oral BID  . rivaroxaban  20 mg Oral QPM  . sacubitril-valsartan  1 tablet Oral BID  . sodium chloride flush  3 mL Intravenous Q12H  . sodium chloride flush  3 mL Intravenous Q12H  . spironolactone  25 mg Oral Daily      Physical Exam: Blood pressure 129/96, pulse 90, temperature 98.7 F (37.1 C), temperature source Oral, resp. rate 20, height 6' (1.829 m), weight 132.768 kg (292 lb 11.2 oz), SpO2 99  %.   Affect appropriate Obese black male  HEENT: normal Neck supple with no adenopathy JVP normal no bruits no thyromegaly Lungs clear with no wheezing and good diaphragmatic motion Heart:  S1/S2 no murmur, no rub, gallop or click PMI normal Abdomen: benighn, BS positve, no tenderness, no AAA no bruit.  No HSM or HJR Distal pulses intact with no bruits Plus 2 LE edema Neuro non-focal Skin warm and dry No muscular weakness   Labs:   Lab Results  Component Value Date   WBC 4.0 11/26/2015   HGB 10.5* 11/26/2015   HCT 33.0* 11/26/2015   MCV 85.7 11/26/2015   PLT 226 11/26/2015    Recent Labs Lab 11/26/15 0156  NA 138  K 3.5  CL 106  CO2 27  BUN 16  CREATININE 1.05  CALCIUM 8.5*  PROT 6.4*  BILITOT 2.0*  ALKPHOS 59  ALT 20  AST 22  GLUCOSE 87   Lab Results  Component Value Date   CKTOTAL 378* 12/07/2007   CKMB 7.0* 12/07/2007   TROPONINI 0.03 11/26/2015    Lab Results  Component Value Date   CHOL 139 06/14/2015   CHOL 154 04/02/2014   CHOL 167 01/05/2011   Lab Results  Component Value Date   HDL 28* 06/14/2015   HDL 44 04/02/2014   HDL 45 01/05/2011   Lab Results  Component Value Date   LDLCALC 99 06/14/2015   LDLCALC 98 04/02/2014   LDLCALC 108* 01/05/2011   Lab Results  Component Value Date   TRIG 58 06/14/2015   TRIG 61 04/02/2014   TRIG 70 01/05/2011   Lab Results  Component Value Date   CHOLHDL 5.0 06/14/2015   CHOLHDL 3.5 04/02/2014   CHOLHDL 3.7 01/05/2011   No results found for: LDLDIRECT    Radiology: Dg Chest 2 View  11/25/2015  CLINICAL DATA:  Shortness of breath EXAM: CHEST  2 VIEW COMPARISON:  Nov 17, 2015 FINDINGS: Stable cardiomegaly. Mild a moderate pulmonary edema has improved since Nov 17, 2015. No pneumothorax. No other interval changes. IMPRESSION: Persistent but improving pulmonary edema.  Cardiomegaly. Electronically Signed   By: Gerome Sam III M.D   On: 11/25/2015 13:27   Dg Chest 2 View  11/17/2015   CLINICAL DATA:  Chest pain and short of breath EXAM: CHEST  2 VIEW COMPARISON:  11/05/2015 FINDINGS: Cardiac enlargement with vascular congestion. Progression of bibasilar airspace disease. Small amount of fluid in the fissure. No layering pleural effusion. IMPRESSION: Congestive heart failure with pulmonary edema which has progressed in the interval. Progression of bibasilar airspace disease compatible with  edema/infiltrate. Electronically Signed   By: Marlan Palau M.D.   On: 11/17/2015 21:18   Dg Chest 2 View  11/05/2015  CLINICAL DATA:  Shortness of Breath EXAM: CHEST  2 VIEW COMPARISON:  Oct 31, 2015 FINDINGS: There is mild atelectatic change in the right lower lobe region. Lungs elsewhere clear. There is cardiomegaly with pulmonary vascularity within normal limits. No adenopathy. There is mild degenerative change in the thoracic spine. IMPRESSION: Mild right base atelectasis. Lungs elsewhere clear. Stable cardiomegaly. Electronically Signed   By: Bretta Bang III M.D.   On: 11/05/2015 10:52   Dg Chest 2 View  10/31/2015  CLINICAL DATA:  Lower extremity swelling, recent pneumonia EXAM: CHEST  2 VIEW COMPARISON:  10/29/2015 FINDINGS: Soft tissues overlying the right lower hemithorax. No frank interstitial edema, improved. No focal consolidation. No definite pleural effusion on the lateral view. Cardiomegaly. IMPRESSION: No frank interstitial edema, improved. No evidence of acute cardiopulmonary disease. Electronically Signed   By: Charline Bills M.D.   On: 10/31/2015 08:41   Ct Chest Wo Contrast  11/05/2015  CLINICAL DATA:  Shortness of breath and chest pain for 3 days, initial encounter EXAM: CT CHEST WITHOUT CONTRAST TECHNIQUE: Multidetector CT imaging of the chest was performed following the standard protocol without IV contrast. COMPARISON:  09/19/2015 FINDINGS: The lungs are well aerated bilaterally. Patchy sub solid infiltrates are noted bilaterally worse on the right than the left. No  sizable effusion is seen. The thoracic inlet is within normal limits. The thoracic aorta and pulmonary artery as visualized are within normal limits although no contrast was administered. No significant hilar or mediastinal adenopathy is noted at this time. A few small scattered mediastinal lymph nodes are noted which are not significant by size criteria. The visualized upper abdomen is within normal limits. The bony structures show no acute abnormality. IMPRESSION: Diffuse patchy ground-glass infiltrates throughout both lungs worse on the right than the left. Given the patient's clinical history of lower extremity swelling this may represent some acute alveolar edema although a an acute infectious etiology deserves consideration as well. Followup examination is recommended following appropriate therapy. Electronically Signed   By: Alcide Clever M.D.   On: 11/05/2015 13:14   Dg Chest Port 1 View  10/29/2015  CLINICAL DATA:  Fever and weakness.  Dyspnea tonight. EXAM: PORTABLE CHEST 1 VIEW COMPARISON:  10/26/2015 FINDINGS: There is moderate cardiomegaly, unchanged. Patchy consolidation in the right upper lobe and right lower lobe may represent pneumonia or asymmetric alveolar edema. No large effusions. IMPRESSION: New patchy airspace opacity in the right lung, consistent with pneumonia or asymmetric alveolar edema. Electronically Signed   By: Ellery Plunk M.D.   On: 10/29/2015 05:29    EKG:  afib LVH rate 128  PVC no acute changes    ASSESSMENT AND PLAN:  CHF:  Actually not very volume overloaded for him. Would use Lasix 80 bid for him with PRN zaroxyln for weight gain over 4 lbs. He has been removed from our practice and  We will not follow him in our office.   Afib:  Rate control and anticoagulation strategy using xarelto   Recent cath with no significant CAD and elevated filling pressures consistent with non ischemic DCM  Signed: Charlton Haws 11/26/2015, 7:53 AM

## 2015-11-26 NOTE — Progress Notes (Signed)
Pt don.t want to wear CPAP at night per order will continue to monitor through the night

## 2015-11-27 DIAGNOSIS — I5021 Acute systolic (congestive) heart failure: Secondary | ICD-10-CM

## 2015-11-27 DIAGNOSIS — G4733 Obstructive sleep apnea (adult) (pediatric): Secondary | ICD-10-CM

## 2015-11-27 MED ORDER — SACUBITRIL-VALSARTAN 24-26 MG PO TABS: 1.0000 | ORAL_TABLET | Freq: Two times a day (BID) | ORAL | Status: DC

## 2015-11-27 NOTE — Progress Notes (Signed)
Subjective:  Feeling better  Objective:  Vital Signs in the last 24 hours: Temp:  [98.8 F (37.1 C)-98.9 F (37.2 C)] 98.9 F (37.2 C) (06/07 0729) Pulse Rate:  [86-89] 89 (06/06 2148) Resp:  [19-20] 19 (06/07 0729) BP: (127-136)/(85-110) 136/110 mmHg (06/07 0729) SpO2:  [97 %-100 %] 97 % (06/07 0809) Weight:  [130.228 kg (287 lb 1.6 oz)] 130.228 kg (287 lb 1.6 oz) (06/07 0729)  Intake/Output from previous day: 06/06 0701 - 06/07 0700 In: 720 [P.O.:720] Out: 3600 [Urine:3600] Intake/Output from this shift:    Physical Exam: NECK: Without JVD, HJR, or bruit LUNGS: Decrease breath sounds throughout HEART: Regular rate and rhythm, no murmur, gallop, rub, bruit, thrill, or heave EXTREMITIES:plus 2 edema bilaterally   Lab Results:  Recent Labs  11/25/15 1306 11/26/15 0156  WBC 5.0 4.0  HGB 11.3* 10.5*  PLT 266 226    Recent Labs  11/25/15 1306 11/26/15 0156  NA 139 138  K 3.7 3.5  CL 105 106  CO2 29 27  GLUCOSE 97 87  BUN 18 16  CREATININE 1.12 1.05    Recent Labs  11/26/15 0156 11/26/15 0656  TROPONINI 0.03 0.03   Hepatic Function Panel  Recent Labs  11/26/15 0156  PROT 6.4*  ALBUMIN 3.5  AST 22  ALT 20  ALKPHOS 59  BILITOT 2.0*   No results for input(s): CHOL in the last 72 hours. No results for input(s): PROTIME in the last 72 hours.   Cardiac Studies:  Assessment/Plan:  CHF: Diuresed 2880 cc overnight, total 5980. Weight 287 today, down 5 lbs.  Would use Lasix 80 bid for him with PRN zaroxyln for weight gain over 4 lbs. He has been removed from our practice and   We will not follow him in our office.     Afib:  Rate control and anticoagulation strategy using xarelto   Recent cath with no significant CAD and elevated filling pressures consistent with non ischemic DCM EF 20-25% on echo 09/2015  [name]

## 2015-11-27 NOTE — Discharge Summary (Signed)
Physician Discharge Summary  [name] YNW:[id number] DOB: 1965-09-18 DOA: 11/25/2015  PCP: No PCP Per Patient  Admit date: 11/25/2015 Discharge date: 11/27/2015  Time spent: 35 minutes  Recommendations for Outpatient Follow-up:  1. Follow up with PCP next week. 2. Follow up with Dr Lavera Guise if you are still accepted there.    Discharge Diagnoses:  Principal Problem:   CHF (congestive heart failure) (HCC) Active Problems:   Noncompliance with medication regimen   Chronic atrial fibrillation (HCC)   OSA (obstructive sleep apnea)   Discharge Condition: improved.   Diet recommendation: Cardiac, 2 g NaCl diet.   Filed Weights   11/25/15 2000 11/26/15 0624 11/27/15 0729  Weight: 135.626 kg (299 lb) 132.768 kg (292 lb 11.2 oz) 130.228 kg (287 lb 1.6 oz)    History of present illness: Patient was admitted with acute on chronic combined CHF, afib, non compliance by Dr Selena Batten on November 25, 2015.  As per his H and P:  "[name] is a 50 y.o. male, with Chronic afib, (CHADS2VASC = 2),CHF (EF 20-25%), OSA, hx of medication noncompliance, w/ recent admission for CHF, apparently c/o dyspnea, and lower ext edema. + orthopnea. + pnd, Can't sleep lying down due to cough. Pt denies cp, palp, n/v, diarrhea. Pt presented to ED for evaluation. CXR showed CHF.   Pt will be admitted for CHF due to medication noncompliance, states didn't take bumex. => due to leg swelling, as well as eating salty food.    Hospital Course: patient was admitted into the hospital, and he was diuresed with IV Lasix.  He was seen in consultation with Dr Eden Emms, who recommneded that he be discharged on Lasix, which he has at home, and will take  BID as explained.  He was re-introduced to [facility] as well, and his spinolactone and K supplement were discontinued as pharmacy recommended that the combination may cause hyperkalemia.   It was stressed that he needs to be compliant, as he was not previously, and Demadex was  not prescribed as he believes it doesn't work for him, so he would not take it.   He was terminated from the cardiology practice, and therefore, I will have him follow up with his PCP.  Unfortunately, he would have to find another cardiology group for CHF referral.   He is stable and will be discharged home today.     Consultations:  Cardiology consultation:  Dr Eden Emms.   Discharge Exam: Filed Vitals:   11/26/15 2148 11/27/15 0729  BP: 127/85 136/110  Pulse: 89   Temp: 98.8 F (37.1 C) 98.9 F (37.2 C)  Resp: 20 19   Discharge Instructions   Discharge Instructions    Diet - low sodium heart healthy    Complete by:  As directed      Increase activity slowly    Complete by:  As directed           Current Discharge Medication List    START taking these medications   Details  sacubitril-valsartan (ENTRESTO) 24-26 MG Take 1 tablet by mouth 2 (two) times daily. Qty: 60 tablet, Refills: 1    Lasix  BID.    CONTINUE these medications which have NOT CHANGED   Details  beclomethasone (QVAR) 80 MCG/ACT inhaler Inhale 2 puffs into the lungs daily as needed (shortness of breath). Qty: 1 Inhaler, Refills: 12    metoprolol succinate (TOPROL-XL) 25 MG 24 hr tablet Take 3 tablets (75 mg total) by mouth 2 (two) times daily. Take  with or immediately following a meal. Qty: 180 tablet, Refills: 0    rivaroxaban (XARELTO) 20 MG TABS tablet Take 1 tablet (20 mg total) by mouth every evening. Qty: 30 tablet, Refills: 2      STOP taking these medications     bumetanide (BUMEX) 1 MG tablet      KLOR-CON M20 20 MEQ tablet      losartan (COZAAR) 100 MG tablet      spironolactone (ALDACTONE) 25 MG tablet      torsemide (DEMADEX) 20 MG tablet        Allergies  Allergen Reactions  . Bee Venom Shortness Of Breath and Swelling  . Lipitor [Atorvastatin] Shortness Of Breath and Other (See Comments)    Nose bleed  . Aspirin Other (See Comments)    Chewable 81 mg tablets upsets  patients stomach  . Banana Diarrhea  . Diltiazem Other (See Comments)    Makes heart beat fast  . Hydralazine Other (See Comments)    Patient states it causes Tachycardia      The results of significant diagnostics from this hospitalization (including imaging, microbiology, ancillary and laboratory) are listed below for reference.    Significant Diagnostic Studies: Dg Chest 2 View  11/25/2015  CLINICAL DATA:  Shortness of breath EXAM: CHEST  2 VIEW COMPARISON:  Nov 17, 2015 FINDINGS: Stable cardiomegaly. Mild a moderate pulmonary edema has improved since Nov 17, 2015. No pneumothorax. No other interval changes. IMPRESSION: Persistent but improving pulmonary edema.  Cardiomegaly. Electronically Signed   By: Gerome Sam III M.D   On: 11/25/2015 13:27   Dg Chest 2 View  11/17/2015  CLINICAL DATA:  Chest pain and short of breath EXAM: CHEST  2 VIEW COMPARISON:  11/05/2015 FINDINGS: Cardiac enlargement with vascular congestion. Progression of bibasilar airspace disease. Small amount of fluid in the fissure. No layering pleural effusion. IMPRESSION: Congestive heart failure with pulmonary edema which has progressed in the interval. Progression of bibasilar airspace disease compatible with edema/infiltrate. Electronically Signed   By: Marlan Palau M.D.   On: 11/17/2015 21:18   Dg Chest 2 View  11/05/2015  CLINICAL DATA:  Shortness of Breath EXAM: CHEST  2 VIEW COMPARISON:  Oct 31, 2015 FINDINGS: There is mild atelectatic change in the right lower lobe region. Lungs elsewhere clear. There is cardiomegaly with pulmonary vascularity within normal limits. No adenopathy. There is mild degenerative change in the thoracic spine. IMPRESSION: Mild right base atelectasis. Lungs elsewhere clear. Stable cardiomegaly. Electronically Signed   By: Bretta Bang III M.D.   On: 11/05/2015 10:52   Dg Chest 2 View  10/31/2015  CLINICAL DATA:  Lower extremity swelling, recent pneumonia EXAM: CHEST  2 VIEW  COMPARISON:  10/29/2015 FINDINGS: Soft tissues overlying the right lower hemithorax. No frank interstitial edema, improved. No focal consolidation. No definite pleural effusion on the lateral view. Cardiomegaly. IMPRESSION: No frank interstitial edema, improved. No evidence of acute cardiopulmonary disease. Electronically Signed   By: Charline Bills M.D.   On: 10/31/2015 08:41   Ct Chest Wo Contrast  11/05/2015  CLINICAL DATA:  Shortness of breath and chest pain for 3 days, initial encounter EXAM: CT CHEST WITHOUT CONTRAST TECHNIQUE: Multidetector CT imaging of the chest was performed following the standard protocol without IV contrast. COMPARISON:  09/19/2015 FINDINGS: The lungs are well aerated bilaterally. Patchy sub solid infiltrates are noted bilaterally worse on the right than the left. No sizable effusion is seen. The thoracic inlet is within normal limits. The thoracic  aorta and pulmonary artery as visualized are within normal limits although no contrast was administered. No significant hilar or mediastinal adenopathy is noted at this time. A few small scattered mediastinal lymph nodes are noted which are not significant by size criteria. The visualized upper abdomen is within normal limits. The bony structures show no acute abnormality. IMPRESSION: Diffuse patchy ground-glass infiltrates throughout both lungs worse on the right than the left. Given the patient's clinical history of lower extremity swelling this may represent some acute alveolar edema although a an acute infectious etiology deserves consideration as well. Followup examination is recommended following appropriate therapy. Electronically Signed   By: Alcide Clever M.D.   On: 11/05/2015 13:14   Dg Chest Port 1 View  10/29/2015  CLINICAL DATA:  Fever and weakness.  Dyspnea tonight. EXAM: PORTABLE CHEST 1 VIEW COMPARISON:  10/26/2015 FINDINGS: There is moderate cardiomegaly, unchanged. Patchy consolidation in the right upper lobe and right  lower lobe may represent pneumonia or asymmetric alveolar edema. No large effusions. IMPRESSION: New patchy airspace opacity in the right lung, consistent with pneumonia or asymmetric alveolar edema. Electronically Signed   By: Ellery Plunk M.D.   On: 10/29/2015 05:29    Microbiology: No results found for this or any previous visit (from the past 240 hour(s)).   Labs: Basic Metabolic Panel:  Recent Labs Lab 11/25/15 1306 11/26/15 0156  NA 139 138  K 3.7 3.5  CL 105 106  CO2 29 27  GLUCOSE 97 87  BUN 18 16  CREATININE 1.12 1.05  CALCIUM 8.7* 8.5*   Liver Function Tests:  Recent Labs Lab 11/26/15 0156  AST 22  ALT 20  ALKPHOS 59  BILITOT 2.0*  PROT 6.4*  ALBUMIN 3.5   CBC:  Recent Labs Lab 11/25/15 1306 11/26/15 0156  WBC 5.0 4.0  NEUTROABS 2.9  --   HGB 11.3* 10.5*  HCT 36.0* 33.0*  MCV 85.7 85.7  PLT 266 226   Cardiac Enzymes:  Recent Labs Lab 11/25/15 1306 11/25/15 1943 11/26/15 0156 11/26/15 0656  TROPONINI 0.03 0.03 0.03 0.03   BNP: BNP (last 3 results)  Recent Labs  11/05/15 1018 11/17/15 2026 11/25/15 1254  BNP 499.0* 1060.0* 822.0*     Signed:  Jayel Scaduto MD. Jerrel Ivory.  [facility] 11/27/2015, 1:29 PM

## 2015-11-27 NOTE — Progress Notes (Signed)
Discharge instructions read to Mr [name] and his family CHF instructions reinforced with daily weight instructions reviewed.  Pt and family verbalized understanding to all instructions.  Discharged to home with family and appointment with Dr Selena Batten.

## 2015-11-27 NOTE — Care Management Note (Signed)
Case Management Note  Patient Details  Name: [name] MRN: [id number] Date of Birth: 1965-09-11  Expected Discharge Date:    11/27/2015              Expected Discharge Plan:  Home w Home Health Services  In-House Referral:  NA  Discharge planning Services  CM Consult  Post Acute Care Choice:  Home Health, Resumption of Svcs/PTA Provider Choice offered to:     DME Arranged:    DME Agency:     HH Arranged:  RN HH Agency:  [name]  Status of Service:  Completed, signed off  Medicare Important Message Given:    Date Medicare IM Given:    Medicare IM give by:    Date Additional Medicare IM Given:    Additional Medicare Important Message give by:     If discussed at Long Length of Stay Meetings, dates discussed:    Additional Comments: Pt discharged home today with resumption of HH services. Tim Justis of [facility] made aware of DC. Pt is aware HH has 48 hrs to resume services. Pt started on Entresto. Benefits check unable to be completed prior to DC. Pt given 1 month free card for [facility]. [facility] made aware of DC.  [name], RN 11/27/2015, 1:38 PM

## 2015-12-02 ENCOUNTER — Encounter: Payer: Self-pay | Admitting: Adult Health

## 2015-12-02 ENCOUNTER — Telehealth: Payer: Self-pay | Admitting: Adult Health

## 2015-12-02 ENCOUNTER — Ambulatory Visit (INDEPENDENT_AMBULATORY_CARE_PROVIDER_SITE_OTHER): Payer: Medicare Other | Admitting: Adult Health

## 2015-12-02 ENCOUNTER — Encounter (HOSPITAL_COMMUNITY): Payer: Self-pay | Admitting: *Deleted

## 2015-12-02 ENCOUNTER — Inpatient Hospital Stay (HOSPITAL_COMMUNITY)
Admission: EM | Admit: 2015-12-02 | Discharge: 2015-12-04 | DRG: 292 | Disposition: A | Discharge status: Home Health | Payer: Medicare Other | Attending: Internal Medicine | Admitting: Internal Medicine

## 2015-12-02 ENCOUNTER — Emergency Department (HOSPITAL_COMMUNITY): Payer: Medicare Other

## 2015-12-02 VITALS — BP 150/100 | HR 100 | Ht 72.0 in | Wt 301.0 lb

## 2015-12-02 DIAGNOSIS — K219 Gastro-esophageal reflux disease without esophagitis: Secondary | ICD-10-CM | POA: Diagnosis present

## 2015-12-02 DIAGNOSIS — R600 Localized edema: Secondary | ICD-10-CM | POA: Diagnosis not present

## 2015-12-02 DIAGNOSIS — Z9114 Patient's other noncompliance with medication regimen: Secondary | ICD-10-CM

## 2015-12-02 DIAGNOSIS — Z91199 Patient's noncompliance with other medical treatment and regimen due to unspecified reason: Secondary | ICD-10-CM

## 2015-12-02 DIAGNOSIS — I5023 Acute on chronic systolic (congestive) heart failure: Secondary | ICD-10-CM

## 2015-12-02 DIAGNOSIS — Z9111 Patient's noncompliance with dietary regimen: Secondary | ICD-10-CM

## 2015-12-02 DIAGNOSIS — Z91119 Patient's noncompliance with dietary regimen due to unspecified reason: Secondary | ICD-10-CM

## 2015-12-02 DIAGNOSIS — Z87891 Personal history of nicotine dependence: Secondary | ICD-10-CM

## 2015-12-02 DIAGNOSIS — Z7901 Long term (current) use of anticoagulants: Secondary | ICD-10-CM

## 2015-12-02 DIAGNOSIS — I110 Hypertensive heart disease with heart failure: Secondary | ICD-10-CM | POA: Diagnosis not present

## 2015-12-02 DIAGNOSIS — Z91148 Patient's other noncompliance with medication regimen for other reason: Secondary | ICD-10-CM

## 2015-12-02 DIAGNOSIS — E785 Hyperlipidemia, unspecified: Secondary | ICD-10-CM | POA: Diagnosis present

## 2015-12-02 DIAGNOSIS — I10 Essential (primary) hypertension: Secondary | ICD-10-CM

## 2015-12-02 DIAGNOSIS — Z8711 Personal history of peptic ulcer disease: Secondary | ICD-10-CM

## 2015-12-02 DIAGNOSIS — D649 Anemia, unspecified: Secondary | ICD-10-CM | POA: Diagnosis present

## 2015-12-02 DIAGNOSIS — I428 Other cardiomyopathies: Secondary | ICD-10-CM | POA: Insufficient documentation

## 2015-12-02 DIAGNOSIS — Z8249 Family history of ischemic heart disease and other diseases of the circulatory system: Secondary | ICD-10-CM

## 2015-12-02 DIAGNOSIS — I429 Cardiomyopathy, unspecified: Secondary | ICD-10-CM | POA: Diagnosis present

## 2015-12-02 DIAGNOSIS — I481 Persistent atrial fibrillation: Secondary | ICD-10-CM | POA: Diagnosis present

## 2015-12-02 DIAGNOSIS — Z6841 Body Mass Index (BMI) 40.0 and over, adult: Secondary | ICD-10-CM

## 2015-12-02 DIAGNOSIS — I509 Heart failure, unspecified: Secondary | ICD-10-CM

## 2015-12-02 DIAGNOSIS — I5043 Acute on chronic combined systolic (congestive) and diastolic (congestive) heart failure: Secondary | ICD-10-CM | POA: Diagnosis present

## 2015-12-02 DIAGNOSIS — I5042 Chronic combined systolic (congestive) and diastolic (congestive) heart failure: Secondary | ICD-10-CM | POA: Diagnosis not present

## 2015-12-02 DIAGNOSIS — Z823 Family history of stroke: Secondary | ICD-10-CM

## 2015-12-02 DIAGNOSIS — Z9119 Patient's noncompliance with other medical treatment and regimen: Secondary | ICD-10-CM

## 2015-12-02 DIAGNOSIS — J45909 Unspecified asthma, uncomplicated: Secondary | ICD-10-CM | POA: Diagnosis present

## 2015-12-02 HISTORY — DX: Unspecified atrial fibrillation: I48.91

## 2015-12-02 HISTORY — DX: Other specified postprocedural states: Z98.890

## 2015-12-02 HISTORY — DX: Obstructive sleep apnea (adult) (pediatric): G47.33

## 2015-12-02 HISTORY — DX: Essential (primary) hypertension: I10

## 2015-12-02 HISTORY — DX: Other cardiomyopathies: I42.8

## 2015-12-02 LAB — CBC WITH DIFFERENTIAL/PLATELET
BASOS ABS: 0 10*3/uL (ref 0.0–0.1)
BASOS PCT: 0 %
EOS ABS: 0.1 10*3/uL (ref 0.0–0.7)
EOS PCT: 3 %
HCT: 34.6 % — ABNORMAL LOW (ref 39.0–52.0)
HEMOGLOBIN: 11.3 g/dL — AB (ref 13.0–17.0)
LYMPHS ABS: 1.2 10*3/uL (ref 0.7–4.0)
Lymphocytes Relative: 36 %
MCH: 27.7 pg (ref 26.0–34.0)
MCHC: 32.7 g/dL (ref 30.0–36.0)
MCV: 84.8 fL (ref 78.0–100.0)
Monocytes Absolute: 0.2 10*3/uL (ref 0.1–1.0)
Monocytes Relative: 5 %
NEUTROS PCT: 56 %
Neutro Abs: 1.8 10*3/uL (ref 1.7–7.7)
PLATELETS: 251 10*3/uL (ref 150–400)
RBC: 4.08 MIL/uL — AB (ref 4.22–5.81)
RDW: 15.2 % (ref 11.5–15.5)
WBC: 3.3 10*3/uL — AB (ref 4.0–10.5)

## 2015-12-02 LAB — BASIC METABOLIC PANEL
ANION GAP: 5 (ref 5–15)
BUN: 24 mg/dL — ABNORMAL HIGH (ref 6–20)
CALCIUM: 8.6 mg/dL — AB (ref 8.9–10.3)
CO2: 24 mmol/L (ref 22–32)
Chloride: 108 mmol/L (ref 101–111)
Creatinine, Ser: 1.22 mg/dL (ref 0.61–1.24)
Glucose, Bld: 92 mg/dL (ref 65–99)
Potassium: 3.9 mmol/L (ref 3.5–5.1)
Sodium: 137 mmol/L (ref 135–145)

## 2015-12-02 LAB — BRAIN NATRIURETIC PEPTIDE: B NATRIURETIC PEPTIDE 5: 820 pg/mL — AB (ref 0.0–100.0)

## 2015-12-02 LAB — TROPONIN I: TROPONIN I: 0.06 ng/mL — AB (ref <0.031)

## 2015-12-02 MED ORDER — FUROSEMIDE 80 MG PO TABS: 80.0000 mg | ORAL_TABLET | Freq: Two times a day (BID) | ORAL | Status: DC

## 2015-12-02 MED ORDER — FUROSEMIDE 10 MG/ML IJ SOLN
80.0000 mg | Freq: Once | INTRAMUSCULAR | Status: AC
  Administered 2015-12-02: 80 mg via INTRAVENOUS
  Filled 2015-12-02: qty 8

## 2015-12-02 MED ORDER — ALBUTEROL SULFATE (2.5 MG/3ML) 0.083% IN NEBU
5.0000 mg | INHALATION_SOLUTION | Freq: Once | RESPIRATORY_TRACT | Status: AC
  Administered 2015-12-02: 5 mg via RESPIRATORY_TRACT
  Filled 2015-12-02: qty 6

## 2015-12-02 MED ORDER — METOLAZONE 5 MG PO TABS: 5.0000 mg | ORAL_TABLET | Freq: Every day | ORAL | Status: DC

## 2015-12-02 NOTE — Telephone Encounter (Signed)
Patient dismissed from [facility] effective 12/02/2015. Dismissal letter sent out by certified / registered mail 12/02/15 fbg.

## 2015-12-02 NOTE — ED Provider Notes (Signed)
CSN: [id number]     Arrival date & time 12/02/15  2202 History  By signing my name below, I, Vista Mink, attest that this documentation has been prepared under the direction and in the presence of Benjiman Core, MD. Electronically signed, Vista Mink, ED Scribe. 12/02/2015. 10:27 PM.    Chief Complaint  Patient presents with  . Leg Swelling   The history is provided by the patient. No language interpreter was used.   HPI Comments: [name] is a 50 y.o. male with a PMHx of HTN, HLD, and morbid obesity, who presents to the Emergency Department complaining of gradual onset, constant leg stiffness onset four hours ago. Pt states he was seen at [facility] today for updates to his medications. Pt reports that he takes Lasix and has not taken his normal dose today. Pt reports associated symptoms of dysuria, chest pain and shortness of breath.  Past Medical History  Diagnosis Date  . Hypertensive heart disease     Hypertensive heart disease with prior episodes of CHF  . Hyperlipidemia   . Hypertension   . Gastroesophageal reflux disease   . Osteoarthritis   . Peptic ulcer disease   . Morbid obesity (HCC)     /sleep apnea  . Allergic rhinitis     plus h/o asthma  . Tobacco abuse, in remission     Cigarettes discontinued in 2010  . CHF (congestive heart failure) (HCC)     EF20-25%  . A-fib (HCC)     2015  . Asthma   . Noncompliance   . Pulmonary hypertension (HCC) 09/19/2015  . Chronic diastolic CHF (congestive heart failure) [facility])    Past Surgical History  Procedure Laterality Date  . Cardiac catheterization N/A 06/14/2015    Procedure: Right/Left Heart Cath and Coronary Angiography;  Surgeon: Runell Gess, MD;  Location: [facility] INVASIVE CV LAB;  Service: Cardiovascular;  Laterality: N/A;   Family History  Problem Relation Age of Onset  . Colon cancer Neg Hx   . Inflammatory bowel disease Neg Hx   . Liver disease Neg Hx   . Stroke Father   . Heart attack Father     Social History  Substance Use Topics  . Smoking status: Former Smoker -- 0.50 packs/day for 20 years    Types: Cigarettes    Start date: 04/26/1988    Quit date: 06/23/2007  . Smokeless tobacco: Never Used     Comment: 1 ppd former smoker  . Alcohol Use: No     Comment: no etoh since 2009    Review of Systems  Respiratory: Positive for shortness of breath.   Cardiovascular: Positive for chest pain.  Genitourinary: Positive for dysuria.  Musculoskeletal: Positive for arthralgias (Bilateral lower extremities. ).      Allergies  Bee venom; Lipitor; Aspirin; Banana; Diltiazem; and Hydralazine  Home Medications   Prior to Admission medications   Medication Sig Start Date End Date Taking? Authorizing Provider  atorvastatin (LIPITOR) 80 MG tablet Take 80 mg by mouth daily.   Yes Historical Provider, MD  beclomethasone (QVAR) 80 MCG/ACT inhaler Inhale 2 puffs into the lungs daily as needed (shortness of breath). 11/21/15  Yes Erick Blinks, MD  furosemide (LASIX) 80 MG tablet Take 1 tablet (80 mg total) by mouth 2 (two) times daily. 12/02/15  Yes Jodelle Gross, NP  metolazone (ZAROXOLYN) 5 MG tablet Take 1 tablet (5 mg total) by mouth daily. 12/02/15  Yes Jodelle Gross, NP  metoprolol succinate (TOPROL-XL) 25  MG 24 hr tablet Take 3 tablets (75 mg total) by mouth 2 (two) times daily. Take with or immediately following a meal. 11/21/15  Yes Erick Blinks, MD  potassium chloride SA (K-DUR,KLOR-CON) 20 MEQ tablet Take 20 mEq by mouth 2 (two) times daily.   Yes Historical Provider, MD  rivaroxaban (XARELTO) 20 MG TABS tablet Take 1 tablet (20 mg total) by mouth every evening. 11/21/15  Yes Erick Blinks, MD  sacubitril-valsartan (ENTRESTO) 24-26 MG Take 1 tablet by mouth 2 (two) times daily. 11/27/15  Yes Houston Siren, MD   BP 175/122 mmHg  Pulse 63  Temp(Src) 98.4 F (36.9 C) (Oral)  Resp 26  Ht 6' (1.829 m)  Wt 299 lb (135.626 kg)  BMI 40.54 kg/m2  SpO2 94% Physical Exam   Pulmonary/Chest: He has rales.  Rales bilaterally Dyspneic  Musculoskeletal: He exhibits edema.  Pitting edema to both legs     ED Course  Procedures  10:25 PM-Will order imaging and blood work. Discussed treatment plan with pt at bedside and pt agreed to plan.   Labs Review Labs Reviewed  CBC WITH DIFFERENTIAL/PLATELET - Abnormal; Notable for the following:    WBC 3.3 (*)    RBC 4.08 (*)    Hemoglobin 11.3 (*)    HCT 34.6 (*)    All other components within normal limits  TROPONIN I - Abnormal; Notable for the following:    Troponin I 0.06 (*)    All other components within normal limits  BRAIN NATRIURETIC PEPTIDE - Abnormal; Notable for the following:    B Natriuretic Peptide 820.0 (*)    All other components within normal limits  BASIC METABOLIC PANEL - Abnormal; Notable for the following:    BUN 24 (*)    Calcium 8.6 (*)    All other components within normal limits    Imaging Review Dg Chest 2 View  12/02/2015  CLINICAL DATA:  Increasing shortness of breath and lower extremity swelling over 1 day. Previous smoker. History of CHF and asthma. EXAM: CHEST  2 VIEW COMPARISON:  11/25/2015. FINDINGS: Cardiac enlargement. Suggestion of mild central vascular congestion. Mild perihilar edema. Changes appear to be improving since previous study. No blunting of costophrenic angles. No pneumothorax. Mediastinal contours appear intact. IMPRESSION: Improving congestive changes since previous study. Decreasing perihilar edema. Electronically Signed   By: Burman Nieves M.D.   On: 12/02/2015 23:43   I have personally reviewed and evaluated these images and lab results as part of my medical decision-making.   EKG Interpretation   Date/Time:  Monday December 02 2015 22:43:12 EDT Ventricular Rate:  112 PR Interval:    QRS Duration: 90 QT Interval:  375 QTC Calculation: 512 R Axis:   76 Text Interpretation:  Atrial fibrillation Ventricular premature complex  Borderline repolarization  abnormality Prolonged QT interval Confirmed by  Rubin Payor  MD, Harrold Donath [phone]) on 12/02/2015 11:13:37 PM      MDM   Final diagnoses:  Acute on chronic systolic congestive heart failure (HCC)  Noncompliance with diet and medication regimen    Patient presents with shortness of breath. Mild tachycardia. History of A. fib CHF and noncompliance. Seen earlier today in radiology office and has not followed up with the recommendations. Do not take the Lasix that he was ordered. Now BMP is more up. Has Rales at the bases. X-ray however has improved. Continue dyspnea. Will admit to observation with internal medicine. I personally performed the services described in this documentation, which was scribed in my presence.  The recorded information has been reviewed and is accurate.       Benjiman Core, MD 12/03/15 [phone]

## 2015-12-02 NOTE — Progress Notes (Signed)
Cardiology Office Note   Date:  12/02/2015   ID:  [name], DOB 03-Oct-1965, MRN [id number]  PCP:  No PCP Per Patient  Cardiologist:  Inis Sizer, NP   Chief Complaint  Patient presents with  . Congestive Heart Failure      History of Present Illness: [name] is a 50 y.o. male who presents for ongoing assessment and management of chronic combined CHF secondary to medical and dietary non-compliance with frequent admissions to the hospital and ER for decompensated heart failure. He has EF of 20-25%, with dry wt of 283 lbs. He also has a history of atrial fib on Xarelto, and morbid obesity.     He was recently admitted to [facility] for decompensated CHF, )coming to ER and hospital approximately every two weeks for admission and treatment). He was diuresed and discharged on lasix 80 mg BID and Demedex was discontinued. Discharge weight 287 lbs.      Here today having gained 14 pounds of weight in a week he has been home from the hospital he has not been taking his Lasix as directed. He has not followed his discharge medication regimen. He continues to take the spironolactone, he is not taking his potassium because "I'm allergic to bananas". He continues to have worsening lower extremity edema and shortness of breath. Review of his medications has been on 75 mg of metoprolol twice a day, but he is only taking 25 mg twice a day.     He is here with EMS personnel, through a new program due to calling him as frequently to take him to the hospital. EMS personnel have a list of his medications that do not coincide with his discharge medications. We have reviewed the bottles that he brings with this concerning his medications all of which are labeled appropriately however Lasix has not been picked up from pharmacy since discharge a week ago.he continues to eat fast food and be noncompliant with diet.   Past Medical History  Diagnosis Date  . Hypertensive heart disease    Hypertensive heart disease with prior episodes of CHF  . Hyperlipidemia   . Hypertension   . Gastroesophageal reflux disease   . Osteoarthritis   . Peptic ulcer disease   . Morbid obesity (HCC)     /sleep apnea  . Allergic rhinitis     plus h/o asthma  . Tobacco abuse, in remission     Cigarettes discontinued in 2010  . CHF (congestive heart failure) (HCC)     EF20-25%  . A-fib (HCC)     2015  . Asthma   . Noncompliance   . Pulmonary hypertension (HCC) 09/19/2015  . Chronic diastolic CHF (congestive heart failure) [facility])     Past Surgical History  Procedure Laterality Date  . Cardiac catheterization N/A 06/14/2015    Procedure: Right/Left Heart Cath and Coronary Angiography;  Surgeon: Runell Gess, MD;  Location: [facility] INVASIVE CV LAB;  Service: Cardiovascular;  Laterality: N/A;     Current Outpatient Prescriptions  Medication Sig Dispense Refill  . atorvastatin (LIPITOR) 80 MG tablet Take 80 mg by mouth daily.    . beclomethasone (QVAR) 80 MCG/ACT inhaler Inhale 2 puffs into the lungs daily as needed (shortness of breath). 1 Inhaler 12  . metoprolol succinate (TOPROL-XL) 25 MG 24 hr tablet Take 3 tablets (75 mg total) by mouth 2 (two) times daily. Take with or immediately following a meal. 180 tablet 0  . potassium chloride SA (K-DUR,KLOR-CON) 20 MEQ  tablet Take 20 mEq by mouth 2 (two) times daily.    . rivaroxaban (XARELTO) 20 MG TABS tablet Take 1 tablet (20 mg total) by mouth every evening. 30 tablet 2  . sacubitril-valsartan (ENTRESTO) 24-26 MG Take 1 tablet by mouth 2 (two) times daily. 60 tablet 1  . spironolactone (ALDACTONE) 25 MG tablet Take 25 mg by mouth daily.  0  . [DISCONTINUED] potassium chloride 20 MEQ TBCR Take 20 mEq by mouth 2 (two) times daily. 10 tablet 0   No current facility-administered medications for this visit.    Allergies:   Bee venom; Lipitor; Aspirin; Banana; Diltiazem; and Hydralazine    Social History:  The patient  reports that he quit  smoking about 8 years ago. His smoking use included Cigarettes. He started smoking about 27 years ago. He has a 10 pack-year smoking history. He has never used smokeless tobacco. He reports that he does not drink alcohol or use illicit drugs.   Family History:  The patient's family history includes Heart attack in his father; Stroke in his father. There is no history of Colon cancer, Inflammatory bowel disease, or Liver disease.    ROS: All other systems are reviewed and negative. Unless otherwise mentioned in H&P    PHYSICAL EXAM: VS:  BP 150/100 mmHg  Pulse 100  Ht 6' (1.829 m)  Wt 301 lb (136.533 kg)  BMI 40.81 kg/m2  SpO2 97% , BMI Body mass index is 40.81 kg/(m^2). GEN: Well nourished, well developed, in no acute distress HEENT: normal Neck: no JVD, carotid bruits, or masses Cardiac: RRR; tachycardic,no murmurs, rubs, or gallops, 2+ edema edema  Respiratory:  clear to auscultation bilaterally, normal work of breathing GI: soft, nontender, distended.  MS: no deformity or atrophy Skin: warm and dry, no rash Neuro:  Strength and sensation are intact Psych: euthymic mood, flat  affect  Recent Labs: 10/31/2015: Magnesium 1.9 11/25/2015: B Natriuretic Peptide 822.0*; TSH 1.261 11/26/2015: ALT 20; BUN 16; Creatinine, Ser 1.05; Hemoglobin 10.5*; Platelets 226; Potassium 3.5; Sodium 138    Lipid Panel    Component Value Date/Time   CHOL 139 06/14/2015 0328   TRIG 58 06/14/2015 0328   HDL 28* 06/14/2015 0328   CHOLHDL 5.0 06/14/2015 0328   VLDL 12 06/14/2015 0328   LDLCALC 99 06/14/2015 0328      Wt Readings from Last 3 Encounters:  12/02/15 301 lb (136.533 kg)  11/27/15 287 lb 1.6 oz (130.228 kg)  11/21/15 285 lb 14.4 oz (129.683 kg)     ASSESSMENT AND PLAN:  1. Acute on chronic next CHF: Again the patient comes with weight gain, decompensation. He has not been taking his Lasix as directed, did not pick up from the pharmacy, he has been taking metoprolol 25 mg twice a day  instead of 75 mg twice a day, and has been eating a lot of salty foods and eating out. He is refusing take potassium as he states he is allergic to bananas.  To avoid another hospitalization I have given him a dose of metolazone 5 mg x1, I have re- ordered Lasix 80 mg twice a day which he is to pick up on his way home. EMS personnel who accompanies him we'll take him by to pick it up. She will go over his medications with him at home and make sure he is taking them appropriately. The patient is advised to be more compliant with his medications. He will continue Entresto as directed. Will need follow up BMET on  next appointment.  I have done extensive counseling again concerning dietary compliance, medical compliance, this is reinforced by EMS personnel who are with him.  2. Chronic atrial fibrillation:heart rate is now well controlled. He is advised to take but Toprol 75 mg twice a day as directed and not 25 mg twice a day. He will continue XARELTO. I am concerned with his noncompliance issues concerning his medications, and high-risk for stroke should he stop it.  3. Chronic dyspnea on exertion,multifactorial. The patient is to continue inhalers as directed. Weight loss is recommended.   Current medicines are reviewed at length with the patient today.    Labs/ tests ordered today include:  No orders of the defined types were placed in this encounter.     Disposition:   FU with Dr. Purvis Sheffield on previously scheduled appointment.  Signed, Joni Reining, NP  12/02/2015 3:45 PM    [facility] [address] [place], [place] [address] Phone: [phone]; Fax: [phone]

## 2015-12-02 NOTE — ED Notes (Signed)
Pt brought in by [facility] for c/o leg swelling that has gotten worse today; pt seen his PCP today

## 2015-12-02 NOTE — Patient Instructions (Signed)
Medication Instructions:  Take metolazone 5 mg ( TODAY)  Take Lasix 80 mg two times daily   Labwork: none  Testing/Procedures: none  Follow-Up: Your physician recommends that you schedule a follow-up appointment in: keep your follow up appointment with Dr. Purvis Sheffield on June 26 @ 1:40    Any Other Special Instructions Will Be Listed Below (If Applicable).  MAKE A FOLLOW UP APPOINTMENT WITH DR. Selena Batten     If you need a refill on your cardiac medications before your next appointment, please call your pharmacy.

## 2015-12-02 NOTE — Progress Notes (Deleted)
Name: [name]    DOB: 26-Jan-1966  Age: 50 y.o.  MR#: [id number]       PCP:  No PCP Per Patient      Insurance: Payor: BLUE CROSS BLUE SHIELD MEDICARE / Plan: BCBS MEDICARE / Product Type: *No Product type* /   CC:    Chief Complaint  Patient presents with  . Congestive Heart Failure    VS Filed Vitals:   12/02/15 1536  BP: 150/100  Pulse: 100  Height: 6' (1.829 m)  Weight: 301 lb (136.533 kg)  SpO2: 97%    Weights Current Weight  12/02/15 301 lb (136.533 kg)  11/27/15 287 lb 1.6 oz (130.228 kg)  11/21/15 285 lb 14.4 oz (129.683 kg)    Blood Pressure  BP Readings from Last 3 Encounters:  12/02/15 150/100  11/27/15 136/110  11/21/15 114/88     Admit date:  (Not on file) Last encounter with [facility]:  12/02/2015   Allergy Bee venom; Lipitor; Aspirin; Banana; Diltiazem; and Hydralazine  Current Outpatient Prescriptions  Medication Sig Dispense Refill  . atorvastatin (LIPITOR) 80 MG tablet Take 80 mg by mouth daily.    . beclomethasone (QVAR) 80 MCG/ACT inhaler Inhale 2 puffs into the lungs daily as needed (shortness of breath). 1 Inhaler 12  . metoprolol succinate (TOPROL-XL) 25 MG 24 hr tablet Take 3 tablets (75 mg total) by mouth 2 (two) times daily. Take with or immediately following a meal. 180 tablet 0  . potassium chloride SA (K-DUR,KLOR-CON) 20 MEQ tablet Take 20 mEq by mouth 2 (two) times daily.    . rivaroxaban (XARELTO) 20 MG TABS tablet Take 1 tablet (20 mg total) by mouth every evening. 30 tablet 2  . sacubitril-valsartan (ENTRESTO) 24-26 MG Take 1 tablet by mouth 2 (two) times daily. 60 tablet 1  . spironolactone (ALDACTONE) 25 MG tablet Take 25 mg by mouth daily.  0  . [DISCONTINUED] potassium chloride 20 MEQ TBCR Take 20 mEq by mouth 2 (two) times daily. 10 tablet 0   No current facility-administered medications for this visit.    Discontinued Meds:   There are no discontinued medications.  Patient Active Problem List   Diagnosis Date Noted  . Acute  on chronic systolic congestive heart failure (HCC)   . Acute diastolic CHF (congestive heart failure) (HCC) 11/18/2015  . CHF (congestive heart failure) (HCC) 11/17/2015  . Acute renal failure superimposed on stage 3 chronic kidney disease (HCC) 10/28/2015  . Acute on chronic combined systolic and diastolic heart failure (HCC) 10/08/2015  . OSA (obstructive sleep apnea) 09/20/2015  . Pulmonary hypertension (HCC) 09/19/2015  . Leukopenia 09/19/2015  . Normocytic anemia 09/19/2015  . Chronic atrial fibrillation (HCC)   . Dyspnea on exertion 05/28/2015  . Acute respiratory failure with hypoxia (HCC) 04/01/2015  . Pneumonia 04/01/2015  . Hypokalemia 04/01/2015  . Obesity hypoventilation syndrome (HCC) 08/08/2014  . GERD (gastroesophageal reflux disease) 08/08/2014  . Noncompliance with medication regimen 06/28/2014  . Edema of right lower extremity 06/21/2014  . Fecal urgency 06/21/2014  . Asthma 02/11/2009  . Hyperlipidemia 07/12/2008  . OBESITY, MORBID 07/12/2008  . Anxiety state 07/12/2008  . DEPRESSION 07/12/2008  . Essential hypertension 07/12/2008  . ALLERGIC RHINITIS 07/12/2008  . GERD 07/12/2008  . Peptic ulcer 07/12/2008  . OSTEOARTHRITIS 07/12/2008    LABS    Component Value Date/Time   NA 138 11/26/2015 0156   NA 139 11/25/2015 1306   NA 141 11/20/2015 0551   K 3.5 11/26/2015 0156  K 3.7 11/25/2015 1306   K 3.4* 11/20/2015 0551   CL 106 11/26/2015 0156   CL 105 11/25/2015 1306   CL 108 11/20/2015 0551   CO2 27 11/26/2015 0156   CO2 29 11/25/2015 1306   CO2 25 11/20/2015 0551   GLUCOSE 87 11/26/2015 0156   GLUCOSE 97 11/25/2015 1306   GLUCOSE 82 11/20/2015 0551   BUN 16 11/26/2015 0156   BUN 18 11/25/2015 1306   BUN 27* 11/20/2015 0551   CREATININE 1.05 11/26/2015 0156   CREATININE 1.12 11/25/2015 1306   CREATININE 1.11 11/20/2015 0551   CREATININE 1.02 07/07/2011 0909   CREATININE 1.00 01/05/2011 0840   CALCIUM 8.5* 11/26/2015 0156   CALCIUM 8.7*  11/25/2015 1306   CALCIUM 8.6* 11/20/2015 0551   GFRNONAA >60 11/26/2015 0156   GFRNONAA >60 11/25/2015 1306   GFRNONAA >60 11/20/2015 0551   GFRAA >60 11/26/2015 0156   GFRAA >60 11/25/2015 1306   GFRAA >60 11/20/2015 0551   CMP     Component Value Date/Time   NA 138 11/26/2015 0156   K 3.5 11/26/2015 0156   CL 106 11/26/2015 0156   CO2 27 11/26/2015 0156   GLUCOSE 87 11/26/2015 0156   BUN 16 11/26/2015 0156   CREATININE 1.05 11/26/2015 0156   CREATININE 1.02 07/07/2011 0909   CALCIUM 8.5* 11/26/2015 0156   PROT 6.4* 11/26/2015 0156   ALBUMIN 3.5 11/26/2015 0156   AST 22 11/26/2015 0156   ALT 20 11/26/2015 0156   ALKPHOS 59 11/26/2015 0156   BILITOT 2.0* 11/26/2015 0156   GFRNONAA >60 11/26/2015 0156   GFRAA >60 11/26/2015 0156       Component Value Date/Time   WBC 4.0 11/26/2015 0156   WBC 5.0 11/25/2015 1306   WBC 4.6 11/18/2015 0601   HGB 10.5* 11/26/2015 0156   HGB 11.3* 11/25/2015 1306   HGB 10.7* 11/18/2015 0601   HCT 33.0* 11/26/2015 0156   HCT 36.0* 11/25/2015 1306   HCT 33.7* 11/18/2015 0601   MCV 85.7 11/26/2015 0156   MCV 85.7 11/25/2015 1306   MCV 85.1 11/18/2015 0601    Lipid Panel     Component Value Date/Time   CHOL 139 06/14/2015 0328   TRIG 58 06/14/2015 0328   HDL 28* 06/14/2015 0328   CHOLHDL 5.0 06/14/2015 0328   VLDL 12 06/14/2015 0328   LDLCALC 99 06/14/2015 0328    ABG    Component Value Date/Time   PHART 7.443 06/14/2015 0816   PCO2ART 36.5 06/14/2015 0816   PO2ART 69.0* 06/14/2015 0816   HCO3 24.9* 06/14/2015 0816   TCO2 26 06/14/2015 0816   O2SAT 94.0 06/14/2015 0816     Lab Results  Component Value Date   TSH 1.261 11/25/2015   BNP (last 3 results)  Recent Labs  11/05/15 1018 11/17/15 2026 11/25/15 1254  BNP 499.0* 1060.0* 822.0*    ProBNP (last 3 results) No results for input(s): PROBNP in the last 8760 hours.  Cardiac Panel (last 3 results) No results for input(s): CKTOTAL, CKMB, TROPONINI, RELINDX in  the last 72 hours.  Iron/TIBC/Ferritin/ %Sat    Component Value Date/Time   IRON 21* 09/19/2015 1130   TIBC 349 09/19/2015 1130   FERRITIN 61 09/19/2015 1130   IRONPCTSAT 6* 09/19/2015 1130     EKG Orders placed or performed during the hospital encounter of 11/25/15  . ED EKG  . ED EKG  . EKG 12-Lead  . EKG 12-Lead  . EKG     Prior Assessment  and Plan Problem List as of 12/02/2015      Cardiovascular and Mediastinum   Essential hypertension   Last Assessment & Plan 05/07/2014 Office Visit Written 05/07/2014  4:32 PM by Jodelle Gross, NP    He continues to be non-compliant with medications. Does not take every day, and did not take today. He states it makes him feel weak and tired. Not sure if he has OSA, but certainly had body habitus for thiDue his complaints of fatigue and slow HR, I will decrease metoprolol to Toprol XL 25 mg at HS. I will increase lisinopril to 10 mg BID. Will leave him on diltiazem 120 mg in am with lasix.   He is to follow up in the [place] office as this is closer for him, and what he requests. Will need follow up BMET on next visit with Dr. Purvis Sheffield.       Chronic atrial fibrillation (HCC)   Pulmonary hypertension (HCC)   Acute on chronic combined systolic and diastolic heart failure (HCC)   CHF (congestive heart failure) (HCC)   Acute diastolic CHF (congestive heart failure) (HCC)   Acute on chronic systolic congestive heart failure [facility])     Respiratory   ALLERGIC RHINITIS   Asthma   Last Assessment & Plan 11/28/2010 Office Visit Written 11/28/2010  3:49 PM by Kathlen Brunswick, MD    Patient has required an inhaler for treatment of an exacerbation of his asthma in recent months.  This is another reason to discontinue beta blocker treatment.      Acute respiratory failure with hypoxia (HCC)   Pneumonia   OSA (obstructive sleep apnea)     Digestive   GERD   Last Assessment & Plan 06/21/2014 Office Visit Written 06/21/2014  3:28 PM by Tiffany Kocher, PA-C    Vague anorexia, weight loss, heartburn. We'll likely offer upper endoscopy for further evaluation however at this time start PPI therapy and await lower extremity Doppler ultrasound findings. Further recommendations to follow.      Peptic ulcer   Last Assessment & Plan 05/07/2014 Office Visit Written 05/07/2014  4:35 PM by Jodelle Gross, NP    He is recommended to see GI specialist to continue treatment. He had been on PPI, but has not been seen by GI, he states. I have given him the names of local GI physicians in [place], He lives in [place] and names of GI specialists there will be provided. He will follow up on his own,.       GERD (gastroesophageal reflux disease)     Musculoskeletal and Integument   OSTEOARTHRITIS     Genitourinary   Acute renal failure superimposed on stage 3 chronic kidney disease (HCC)     Other   Hyperlipidemia   Last Assessment & Plan 11/28/2010 Office Visit Written 11/28/2010  3:47 PM by Kathlen Brunswick, MD    Most recent lipid profile in 2010 was somewhat suboptimal, but adequate in light of the patient's absence of known vascular disease.  A repeat study will be obtained.      OBESITY, MORBID   Last Assessment & Plan 05/06/2011 Office Visit Written 05/06/2011  1:09 PM by Kathlen Brunswick, MD    Patient congratulated on 6 pound weight loss and encouraged to continue to restrict calories and to be as active as possible.      Anxiety state   DEPRESSION   Edema of right lower extremity   Last Assessment & Plan 06/21/2014 Office  Visit Written 06/21/2014  3:26 PM by Tiffany Kocher, PA-C    50 year old gentleman with history of chronic lower extremity edema with current right greater than left swelling. History of atrial fibrillation with irregular rhythm on exam today. Patient deemed not a candidate for anticoagulation due to his medical noncompliance. Obtain venous ultrasound to rule out DVT.       Fecal urgency   Last Assessment & Plan  06/21/2014 Office Visit Written 06/21/2014  3:27 PM by Tiffany Kocher, PA-C    One month history of postprandial fecal urgency without diarrhea. No prior colonoscopy. Given he is having solid stools, no indication for stool studies. Will offer him a colonoscopy in the near future however await lower extremity ultrasound findings first.      Noncompliance with medication regimen   Obesity hypoventilation syndrome (HCC)   Hypokalemia   Dyspnea on exertion   Leukopenia   Normocytic anemia       Imaging: Dg Chest 2 View  11/25/2015  CLINICAL DATA:  Shortness of breath EXAM: CHEST  2 VIEW COMPARISON:  Nov 17, 2015 FINDINGS: Stable cardiomegaly. Mild a moderate pulmonary edema has improved since Nov 17, 2015. No pneumothorax. No other interval changes. IMPRESSION: Persistent but improving pulmonary edema.  Cardiomegaly. Electronically Signed   By: Gerome Sam III M.D   On: 11/25/2015 13:27   Dg Chest 2 View  11/17/2015  CLINICAL DATA:  Chest pain and short of breath EXAM: CHEST  2 VIEW COMPARISON:  11/05/2015 FINDINGS: Cardiac enlargement with vascular congestion. Progression of bibasilar airspace disease. Small amount of fluid in the fissure. No layering pleural effusion. IMPRESSION: Congestive heart failure with pulmonary edema which has progressed in the interval. Progression of bibasilar airspace disease compatible with edema/infiltrate. Electronically Signed   By: Marlan Palau M.D.   On: 11/17/2015 21:18   Dg Chest 2 View  11/05/2015  CLINICAL DATA:  Shortness of Breath EXAM: CHEST  2 VIEW COMPARISON:  Oct 31, 2015 FINDINGS: There is mild atelectatic change in the right lower lobe region. Lungs elsewhere clear. There is cardiomegaly with pulmonary vascularity within normal limits. No adenopathy. There is mild degenerative change in the thoracic spine. IMPRESSION: Mild right base atelectasis. Lungs elsewhere clear. Stable cardiomegaly. Electronically Signed   By: Bretta Bang III M.D.    On: 11/05/2015 10:52   Ct Chest Wo Contrast  11/05/2015  CLINICAL DATA:  Shortness of breath and chest pain for 3 days, initial encounter EXAM: CT CHEST WITHOUT CONTRAST TECHNIQUE: Multidetector CT imaging of the chest was performed following the standard protocol without IV contrast. COMPARISON:  09/19/2015 FINDINGS: The lungs are well aerated bilaterally. Patchy sub solid infiltrates are noted bilaterally worse on the right than the left. No sizable effusion is seen. The thoracic inlet is within normal limits. The thoracic aorta and pulmonary artery as visualized are within normal limits although no contrast was administered. No significant hilar or mediastinal adenopathy is noted at this time. A few small scattered mediastinal lymph nodes are noted which are not significant by size criteria. The visualized upper abdomen is within normal limits. The bony structures show no acute abnormality. IMPRESSION: Diffuse patchy ground-glass infiltrates throughout both lungs worse on the right than the left. Given the patient's clinical history of lower extremity swelling this may represent some acute alveolar edema although a an acute infectious etiology deserves consideration as well. Followup examination is recommended following appropriate therapy. Electronically Signed   By: Alcide Clever M.D.   On:  11/05/2015 13:14          

## 2015-12-03 ENCOUNTER — Encounter (HOSPITAL_COMMUNITY): Payer: Self-pay

## 2015-12-03 DIAGNOSIS — Z9114 Patient's other noncompliance with medication regimen: Secondary | ICD-10-CM | POA: Diagnosis not present

## 2015-12-03 DIAGNOSIS — R600 Localized edema: Secondary | ICD-10-CM | POA: Diagnosis present

## 2015-12-03 DIAGNOSIS — Z8249 Family history of ischemic heart disease and other diseases of the circulatory system: Secondary | ICD-10-CM | POA: Diagnosis not present

## 2015-12-03 DIAGNOSIS — Z6841 Body Mass Index (BMI) 40.0 and over, adult: Secondary | ICD-10-CM | POA: Diagnosis not present

## 2015-12-03 DIAGNOSIS — I110 Hypertensive heart disease with heart failure: Secondary | ICD-10-CM | POA: Diagnosis present

## 2015-12-03 DIAGNOSIS — Z9111 Patient's noncompliance with dietary regimen: Secondary | ICD-10-CM | POA: Diagnosis not present

## 2015-12-03 DIAGNOSIS — I5023 Acute on chronic systolic (congestive) heart failure: Secondary | ICD-10-CM

## 2015-12-03 DIAGNOSIS — I5043 Acute on chronic combined systolic (congestive) and diastolic (congestive) heart failure: Secondary | ICD-10-CM

## 2015-12-03 DIAGNOSIS — Z8711 Personal history of peptic ulcer disease: Secondary | ICD-10-CM | POA: Diagnosis not present

## 2015-12-03 DIAGNOSIS — D649 Anemia, unspecified: Secondary | ICD-10-CM | POA: Diagnosis present

## 2015-12-03 DIAGNOSIS — E66813 Obesity, class 3: Secondary | ICD-10-CM | POA: Insufficient documentation

## 2015-12-03 DIAGNOSIS — I10 Essential (primary) hypertension: Secondary | ICD-10-CM | POA: Diagnosis not present

## 2015-12-03 DIAGNOSIS — I481 Persistent atrial fibrillation: Secondary | ICD-10-CM | POA: Diagnosis present

## 2015-12-03 DIAGNOSIS — E785 Hyperlipidemia, unspecified: Secondary | ICD-10-CM | POA: Diagnosis present

## 2015-12-03 DIAGNOSIS — Z823 Family history of stroke: Secondary | ICD-10-CM | POA: Diagnosis not present

## 2015-12-03 DIAGNOSIS — I428 Other cardiomyopathies: Secondary | ICD-10-CM | POA: Insufficient documentation

## 2015-12-03 DIAGNOSIS — I5021 Acute systolic (congestive) heart failure: Secondary | ICD-10-CM | POA: Diagnosis not present

## 2015-12-03 DIAGNOSIS — K219 Gastro-esophageal reflux disease without esophagitis: Secondary | ICD-10-CM | POA: Diagnosis present

## 2015-12-03 DIAGNOSIS — Z7901 Long term (current) use of anticoagulants: Secondary | ICD-10-CM | POA: Diagnosis not present

## 2015-12-03 DIAGNOSIS — I509 Heart failure, unspecified: Secondary | ICD-10-CM

## 2015-12-03 DIAGNOSIS — I429 Cardiomyopathy, unspecified: Secondary | ICD-10-CM

## 2015-12-03 DIAGNOSIS — Z87891 Personal history of nicotine dependence: Secondary | ICD-10-CM | POA: Diagnosis not present

## 2015-12-03 DIAGNOSIS — J45909 Unspecified asthma, uncomplicated: Secondary | ICD-10-CM | POA: Diagnosis present

## 2015-12-03 DIAGNOSIS — Z9119 Patient's noncompliance with other medical treatment and regimen: Secondary | ICD-10-CM | POA: Diagnosis not present

## 2015-12-03 LAB — COMPREHENSIVE METABOLIC PANEL
ALBUMIN: 4 g/dL (ref 3.5–5.0)
ALK PHOS: 66 U/L (ref 38–126)
ALT: 23 U/L (ref 17–63)
ANION GAP: 10 (ref 5–15)
AST: 27 U/L (ref 15–41)
BILIRUBIN TOTAL: 2.1 mg/dL — AB (ref 0.3–1.2)
BUN: 21 mg/dL — ABNORMAL HIGH (ref 6–20)
CALCIUM: 9.2 mg/dL (ref 8.9–10.3)
CO2: 28 mmol/L (ref 22–32)
Chloride: 103 mmol/L (ref 101–111)
Creatinine, Ser: 1.25 mg/dL — ABNORMAL HIGH (ref 0.61–1.24)
GLUCOSE: 90 mg/dL (ref 65–99)
POTASSIUM: 3.2 mmol/L — AB (ref 3.5–5.1)
Sodium: 141 mmol/L (ref 135–145)
TOTAL PROTEIN: 7.2 g/dL (ref 6.5–8.1)

## 2015-12-03 LAB — CBC WITH DIFFERENTIAL/PLATELET
Basophils Absolute: 0 10*3/uL (ref 0.0–0.1)
Basophils Relative: 0 %
Eosinophils Absolute: 0.1 10*3/uL (ref 0.0–0.7)
Eosinophils Relative: 2 %
HEMATOCRIT: 38 % — AB (ref 39.0–52.0)
HEMOGLOBIN: 12.2 g/dL — AB (ref 13.0–17.0)
LYMPHS ABS: 1.3 10*3/uL (ref 0.7–4.0)
LYMPHS PCT: 36 %
MCH: 27.2 pg (ref 26.0–34.0)
MCHC: 32.1 g/dL (ref 30.0–36.0)
MCV: 84.8 fL (ref 78.0–100.0)
Monocytes Absolute: 0.3 10*3/uL (ref 0.1–1.0)
Monocytes Relative: 9 %
NEUTROS ABS: 1.9 10*3/uL (ref 1.7–7.7)
NEUTROS PCT: 53 %
Platelets: 254 10*3/uL (ref 150–400)
RBC: 4.48 MIL/uL (ref 4.22–5.81)
RDW: 15.1 % (ref 11.5–15.5)
WBC: 3.6 10*3/uL — AB (ref 4.0–10.5)

## 2015-12-03 LAB — TROPONIN I
TROPONIN I: 0.05 ng/mL — AB (ref <0.031)
TROPONIN I: 0.05 ng/mL — AB (ref <0.031)
TROPONIN I: 0.05 ng/mL — AB (ref <0.031)

## 2015-12-03 MED ORDER — FUROSEMIDE 10 MG/ML IJ SOLN
40.0000 mg | Freq: Two times a day (BID) | INTRAMUSCULAR | Status: DC
  Administered 2015-12-03 – 2015-12-04 (×3): 40 mg via INTRAVENOUS
  Filled 2015-12-03 (×3): qty 4

## 2015-12-03 MED ORDER — ATORVASTATIN CALCIUM 40 MG PO TABS
80.0000 mg | ORAL_TABLET | Freq: Every day | ORAL | Status: DC
  Administered 2015-12-03 – 2015-12-04 (×2): 80 mg via ORAL
  Filled 2015-12-03 (×2): qty 2

## 2015-12-03 MED ORDER — RIVAROXABAN 20 MG PO TABS
20.0000 mg | ORAL_TABLET | Freq: Every evening | ORAL | Status: DC
  Administered 2015-12-03: 20 mg via ORAL
  Filled 2015-12-03: qty 1

## 2015-12-03 MED ORDER — SODIUM CHLORIDE 0.9% FLUSH: 3.0000 mL | INTRAVENOUS | Status: DC | PRN

## 2015-12-03 MED ORDER — SACUBITRIL-VALSARTAN 49-51 MG PO TABS
1.0000 | ORAL_TABLET | Freq: Two times a day (BID) | ORAL | Status: DC
  Administered 2015-12-03 – 2015-12-04 (×3): 1 via ORAL
  Filled 2015-12-03 (×7): qty 1

## 2015-12-03 MED ORDER — SODIUM CHLORIDE 0.9% FLUSH
3.0000 mL | Freq: Two times a day (BID) | INTRAVENOUS | Status: DC
  Administered 2015-12-03 (×2): 3 mL via INTRAVENOUS

## 2015-12-03 MED ORDER — METOPROLOL SUCCINATE ER 50 MG PO TB24
75.0000 mg | ORAL_TABLET | Freq: Two times a day (BID) | ORAL | Status: DC
  Administered 2015-12-03 – 2015-12-04 (×3): 75 mg via ORAL
  Filled 2015-12-03 (×3): qty 1

## 2015-12-03 MED ORDER — ACETAMINOPHEN 325 MG PO TABS: 650.0000 mg | ORAL_TABLET | ORAL | Status: DC | PRN

## 2015-12-03 MED ORDER — SODIUM CHLORIDE 0.9 % IV SOLN: 250.0000 mL | INTRAVENOUS | Status: DC | PRN

## 2015-12-03 MED ORDER — POTASSIUM CHLORIDE CRYS ER 20 MEQ PO TBCR
40.0000 meq | EXTENDED_RELEASE_TABLET | Freq: Two times a day (BID) | ORAL | Status: DC
  Administered 2015-12-03 – 2015-12-04 (×2): 40 meq via ORAL
  Filled 2015-12-03 (×3): qty 2

## 2015-12-03 MED ORDER — NITROGLYCERIN 2 % TD OINT
1.0000 [in_us] | TOPICAL_OINTMENT | Freq: Three times a day (TID) | TRANSDERMAL | Status: DC
  Administered 2015-12-03 – 2015-12-04 (×4): 1 [in_us] via TOPICAL
  Filled 2015-12-03 (×4): qty 1

## 2015-12-03 MED ORDER — ONDANSETRON HCL 4 MG/2ML IJ SOLN: 4.0000 mg | Freq: Four times a day (QID) | INTRAMUSCULAR | Status: DC | PRN

## 2015-12-03 MED ORDER — POTASSIUM CHLORIDE CRYS ER 20 MEQ PO TBCR
20.0000 meq | EXTENDED_RELEASE_TABLET | Freq: Two times a day (BID) | ORAL | Status: DC
  Administered 2015-12-03: 20 meq via ORAL
  Filled 2015-12-03: qty 1

## 2015-12-03 MED ORDER — BUDESONIDE 0.25 MG/2ML IN SUSP: 0.2500 mg | Freq: Every day | RESPIRATORY_TRACT | Status: DC | PRN

## 2015-12-03 NOTE — H&P (Signed)
TRH H&P   Patient Demographics:    [place] Kuang, is a 50 y.o. male  MRN: [id number]   DOB - Oct 04, 1965  Admit Date - 12/02/2015  Outpatient Primary MD for the patient is Pearson Grippe, MD  Referring MD/NP/PA: Benjiman Core  Outpatient Specialists: Darl Householder (cardiologist)  Patient coming from: home  Chief Complaint  Patient presents with  . Leg Swelling      HPI:    Breion Novacek  is a 50 y.o. male, CHF (EF =25%), w excessive readmission rates due to medication noncompliance, apparently recently discharged after diuresis presents with c/o palpitations at home and dyspnea at home. + orthopnea, denies lower ext edema.   Pt thinks that the  Metolazone might have contributed to this feeling. Marland Kitchen  Pt presented to ED for evaluation.  Pt had evidence of elevation in BNP and also CXR showed chf.  EKG showed afib with rvr.  Pt will be admitted for CHF.     Review of systems:    In addition to the HPI above,  No Fever-chills, No Headache, No changes with Vision or hearing, No problems swallowing food or Liquids, No Chest pain, Cough  No Abdominal pain, No Nausea or Vommitting, Bowel movements are regular, No Blood in stool or Urine, No dysuria, No new skin rashes or bruises, No new joints pains-aches,  No new weakness, tingling, numbness in any extremity, No recent weight gain or loss, No polyuria, polydypsia or polyphagia, No significant Mental Stressors.  A full 10 point Review of Systems was done, except as stated above, all other Review of Systems were negative.   With Past History of the following :    Past Medical History  Diagnosis Date  . Hypertensive heart disease     Hypertensive heart disease with prior episodes of CHF  . Hyperlipidemia   . Hypertension   . Gastroesophageal reflux disease   . Osteoarthritis   . Peptic ulcer disease   . Morbid obesity (HCC)       /sleep apnea  . Allergic rhinitis     plus h/o asthma  . Tobacco abuse, in remission     Cigarettes discontinued in 2010  . CHF (congestive heart failure) (HCC)     EF20-25%  . A-fib (HCC)     2015  . Asthma   . Noncompliance   . Pulmonary hypertension (HCC) 09/19/2015  . [facility]       Past Surgical History  Procedure Laterality Date  . Cardiac catheterization N/A 06/14/2015    Procedure: Right/Left Heart Cath and Coronary Angiography;  Surgeon: Runell Gess, MD;  Location: [facility] INVASIVE CV LAB;  Service: Cardiovascular;  Laterality: N/A;      Social History:     Social History  Substance Use Topics  . Smoking status: Former Smoker -- 0.50 packs/day for 20 years  Types: Cigarettes    Start date: 04/26/1988    Quit date: 06/23/2007  . Smokeless tobacco: Never Used     Comment: 1 ppd former smoker  . Alcohol Use: No     Comment: no etoh since 2009     Lives - at home  Mobility - walks without assistance     Family History :     Family History  Problem Relation Age of Onset  . Colon cancer Neg Hx   . Inflammatory bowel disease Neg Hx   . Liver disease Neg Hx   . Stroke Father   . Heart attack Father       Home Medications:   Prior to Admission medications   Medication Sig Start Date End Date Taking? Authorizing Provider  atorvastatin (LIPITOR) 80 MG tablet Take 80 mg by mouth daily.   Yes Historical Provider, MD  beclomethasone (QVAR) 80 MCG/ACT inhaler Inhale 2 puffs into the lungs daily as needed (shortness of breath). 11/21/15  Yes Erick Blinks, MD  furosemide (LASIX) 80 MG tablet Take 1 tablet (80 mg total) by mouth 2 (two) times daily. 12/02/15  Yes Jodelle Gross, NP  metolazone (ZAROXOLYN) 5 MG tablet Take 1 tablet (5 mg total) by mouth daily. 12/02/15  Yes Jodelle Gross, NP  metoprolol succinate (TOPROL-XL) 25 MG 24 hr tablet Take 3 tablets (75 mg total) by mouth 2 (two) times daily. Take  with or immediately following a meal. 11/21/15  Yes Erick Blinks, MD  potassium chloride SA (K-DUR,KLOR-CON) 20 MEQ tablet Take 20 mEq by mouth 2 (two) times daily.   Yes Historical Provider, MD  rivaroxaban (XARELTO) 20 MG TABS tablet Take 1 tablet (20 mg total) by mouth every evening. 11/21/15  Yes Erick Blinks, MD  sacubitril-valsartan (ENTRESTO) 24-26 MG Take 1 tablet by mouth 2 (two) times daily. 11/27/15  Yes Houston Siren, MD     Allergies:     Allergies  Allergen Reactions  . Bee Venom Shortness Of Breath and Swelling  . Lipitor [Atorvastatin] Shortness Of Breath and Other (See Comments)    Nose bleed  . Aspirin Other (See Comments)    Chewable 81 mg tablets upsets patients stomach  . Banana Diarrhea  . Diltiazem Other (See Comments)    Makes heart beat fast  . Hydralazine Other (See Comments)    Patient states it causes Tachycardia     Physical Exam:   Vitals  Blood pressure 167/118, pulse 94, temperature 98.4 F (36.9 C), temperature source Oral, resp. rate 22, height 6' (1.829 m), weight 135.626 kg (299 lb), SpO2 97 %.   1. General  lying in bed in NAD,    2. Normal affect and insight, Not Suicidal or Homicidal, Awake Alert, Oriented X 3.  3. No F.N deficits, ALL C.Nerves Intact, Strength 5/5 all 4 extremities, Sensation intact all 4 extremities, Plantars down going.  4. Ears and Eyes appear Normal, Conjunctivae clear, PERRLA. Moist Oral Mucosa.  5. Supple Neck, No JVD, No cervical lymphadenopathy appriciated, No Carotid Bruits.  6. Symmetrical Chest wall movement, Good air movement bilaterally, CTAB.  7. Irr, irr, s1, s2,  No Gallops, Rubs or Murmurs, No Parasternal Heave.  8. Positive Bowel Sounds, Abdomen Soft, No tenderness, No organomegaly appriciated,No rebound -guarding or rigidity.  9.  No Cyanosis, Normal Skin Turgor, No Skin Rash or Bruise.  10. Good muscle tone,  joints appear normal , no effusions, Normal ROM.  11. No Palpable Lymph Nodes in Neck or  Axillae  Data Review:    CBC  Recent Labs Lab 11/26/15 0156 12/02/15 2238  WBC 4.0 3.3*  HGB 10.5* 11.3*  HCT 33.0* 34.6*  PLT 226 251  MCV 85.7 84.8  MCH 27.3 27.7  MCHC 31.8 32.7  RDW 15.0 15.2  LYMPHSABS  --  1.2  MONOABS  --  0.2  EOSABS  --  0.1  BASOSABS  --  0.0   ------------------------------------------------------------------------------------------------------------------  Chemistries   Recent Labs Lab 11/26/15 0156 12/02/15 2238  NA 138 137  K 3.5 3.9  CL 106 108  CO2 27 24  GLUCOSE 87 92  BUN 16 24*  CREATININE 1.05 1.22  CALCIUM 8.5* 8.6*  AST 22  --   ALT 20  --   ALKPHOS 59  --   BILITOT 2.0*  --    ------------------------------------------------------------------------------------------------------------------ estimated creatinine clearance is 106.7 mL/min (by C-G formula based on Cr of 1.22). ------------------------------------------------------------------------------------------------------------------ No results for input(s): TSH, T4TOTAL, T3FREE, THYROIDAB in the last 72 hours.  Invalid input(s): FREET3  Coagulation profile No results for input(s): INR, PROTIME in the last 168 hours. ------------------------------------------------------------------------------------------------------------------- No results for input(s): DDIMER in the last 72 hours. -------------------------------------------------------------------------------------------------------------------  Cardiac Enzymes  Recent Labs Lab 11/26/15 0156 11/26/15 0656 12/02/15 2238  TROPONINI 0.03 0.03 0.06*   ------------------------------------------------------------------------------------------------------------------    Component Value Date/Time   BNP 820.0* 12/02/2015 2238     ---------------------------------------------------------------------------------------------------------------  Urinalysis    Component Value Date/Time   COLORURINE YELLOW  10/29/2015 1012   APPEARANCEUR CLEAR 10/29/2015 1012   LABSPEC 1.019 10/29/2015 1012   PHURINE 6.0 10/29/2015 1012   GLUCOSEU NEGATIVE 10/29/2015 1012   GLUCOSEU NEG mg/dL 47/65/4650 3546   HGBUR NEGATIVE 10/29/2015 1012   BILIRUBINUR NEGATIVE 10/29/2015 1012   KETONESUR NEGATIVE 10/29/2015 1012   PROTEINUR 30* 10/29/2015 1012   UROBILINOGEN 0.2 08/08/2014 1445   NITRITE NEGATIVE 10/29/2015 1012   LEUKOCYTESUR NEGATIVE 10/29/2015 1012    ----------------------------------------------------------------------------------------------------------------   Imaging Results:    Dg Chest 2 View  12/02/2015  CLINICAL DATA:  Increasing shortness of breath and lower extremity swelling over 1 day. Previous smoker. History of CHF and asthma. EXAM: CHEST  2 VIEW COMPARISON:  11/25/2015. FINDINGS: Cardiac enlargement. Suggestion of mild central vascular congestion. Mild perihilar edema. Changes appear to be improving since previous study. No blunting of costophrenic angles. No pneumothorax. Mediastinal contours appear intact. IMPRESSION: Improving congestive changes since previous study. Decreasing perihilar edema. Electronically Signed   By: Burman Nieves M.D.   On: 12/02/2015 23:43       Assessment & Plan:    Active Problems:   CHF (congestive heart failure) (HCC)    1. CHF (EF 25%) secondary to medication noncompliance and uncontrolled htn Check trop iq6h x3 Check daily weight, strict i and o Lasix 40mg  iv bid Cont spironolactone Increase Entresto  2. Afib with rvr Metoprolol 5mg  iv x1 Cont po metoprolol Cont xarelto Trop i q6h x3 Check tsh  3. Hypertension uncontrolled Cont metoprolol Increase entresto Clonidine 0.1mg  po q6h prn sbp >160  4.  Anemia Repeat cbc in am  DVT Prophylaxis - SCDs   AM Labs Ordered, also please review Full Orders  Family Communication: Admission, patients condition and plan of care including tests being ordered have been discussed with the  patient  who indicate understanding and agree with the plan and Code Status.  Code Status FULL CODE  Likely DC to  Home  Condition GUARDED    Consults called: cardiology consultation ordered  Admission status: observation  Time spent in minutes : 45 minutes   Pearson Grippe M.D on 12/03/2015 at 1:01 AM  Between 7am to 7pm - Pager - [phone]. After 7pm go to [email or web address] - password [facility]  [facility] - Office  [phone]

## 2015-12-03 NOTE — Progress Notes (Signed)
Patient seen and examined  50 y.o. male who presents for ongoing assessment and management of chronic combined CHF secondary to medical and dietary non-compliance with frequent admissions to the hospital and ER for decompensated heart failure. He has EF of 20-25%, with dry wt of 283 lbs. He also has a history of atrial fib on Xarelto, and morbid obesity, last seen by cardiology on 12/02/15, comes in today with shortness of breath and worsening leg edema. Weight is up to 301 pounds. Creatinine stable around baseline 1.2. Potassium 3.2 this morning. Patient evaluated by cardiology  Plan Acute on chronic combined heart failure with volume overload complicated by recurring noncompliance. He has had frequent hospitalizations related to this. EF 20-25%, cardiac catheterization in December 2016 with normal coronaries. Continue diuresis with IV Lasix. Continue to supplement potassium  Persistent atrial fibrillation-continue On Xarelto for stroke prophylaxis. CHADSVASC score is 2.

## 2015-12-03 NOTE — Care Management Obs Status (Signed)
MEDICARE OBSERVATION STATUS NOTIFICATION   Patient Details  Name: [name] MRN: [id number] Date of Birth: 1966/06/11   Medicare Observation Status Notification Given:  Yes    [name], RN 12/03/2015, 4:21 PM

## 2015-12-03 NOTE — Progress Notes (Signed)
Primary cardiologist: Dr. Prentice Docker  Seen for followup: Acute on chronic combined heart failure complicated by recurrent noncompliance  Recent History:   Patient seen in the office by Ms. Lawrence NP yesterday with plan to advance outpatient diuretics. He came to the hospital complaining of shortness of breath and worsening leg edema, this has been a chronic recurring problem in the setting of noncompliance. Weight up to 301 pounds at office visit, prior discharge weight 287 pounds. No chest pain or palpitations. Hypertensive and tachycardic at presentation in atrial fibrillation.  Objective:   Temp:  [97.8 F (36.6 C)-98.4 F (36.9 C)] 97.8 F (36.6 C) (06/13 0334) Pulse Rate:  [29-108] 96 (06/13 0334) Resp:  [7-41] 20 (06/13 0334) BP: (137-175)/(100-139) 160/115 mmHg (06/13 0334) SpO2:  [90 %-100 %] 98 % (06/13 0334) Weight:  [288 lb 4.8 oz (130.772 kg)-301 lb (136.533 kg)] 288 lb 4.8 oz (130.772 kg) (06/13 0938) Last BM Date: 12/02/15  Filed Weights   12/02/15 2209 12/03/15 0334 12/03/15 0938  Weight: 299 lb (135.626 kg) 288 lb 8 oz (130.863 kg) 288 lb 4.8 oz (130.772 kg)    Intake/Output Summary (Last 24 hours) at 12/03/15 1041 Last data filed at 12/03/15 0923  Gross per 24 hour  Intake    240 ml  Output   4300 ml  Net  -4060 ml    Telemetry: Atrial fibrillation.  Exam:  General: Morbidly obese male in no acute distress.  Lungs: Few scattered crackles, no wheezing.  Cardiac: Elevated JVP, irregular irregular with indistinct PMI.  Abdomen: Protuberant, nontender.  Extremities: Firm, chronic appearing bilateral leg edema and lymphedema.  Lab Results:  Basic Metabolic Panel:  Recent Labs Lab 12/02/15 2238 12/03/15 0433  NA 137 141  K 3.9 3.2*  CL 108 103  CO2 24 28  GLUCOSE 92 90  BUN 24* 21*  CREATININE 1.22 1.25*  CALCIUM 8.6* 9.2    Liver Function Tests:  Recent Labs Lab 12/03/15 0433  AST 27  ALT 23  ALKPHOS 66  BILITOT 2.1*    PROT 7.2  ALBUMIN 4.0    CBC:  Recent Labs Lab 12/02/15 2238 12/03/15 0433  WBC 3.3* 3.6*  HGB 11.3* 12.2*  HCT 34.6* 38.0*  MCV 84.8 84.8  PLT 251 254    Cardiac Enzymes:  Recent Labs Lab 12/02/15 2238 12/03/15 0433  TROPONINI 0.06* 0.05*    BNP: 820  Echocardiogram 09/30/2015: Study Conclusions  - Left ventricle: The cavity size was normal. There was severe  concentric hypertrophy. Systolic function was severely reduced.  The estimated ejection fraction was in the range of 20% to 25%.  Diffuse hypokinesis. The study is not technically sufficient to  allow evaluation of LV diastolic function. - Mitral valve: Mildly thickened leaflets . There was trivial  regurgitation. - Left atrium: Moderately dilated at 47 ml/m2. - Right ventricle: The cavity size was mildly dilated. Systolic  function was normal. - Right atrium: Severely dilated at 29 cm2. - Tricuspid valve: There was mild regurgitation. - Pulmonary arteries: PA peak pressure: 49 mm Hg (S). - Inferior vena cava: The vessel was dilated. The respirophasic  diameter changes were blunted (< 50%), consistent with elevated  central venous pressure.  Impressions:  - Compared to a prior study in 05/2015, the LVEF has declined to  20-25% with global hypokinesis. A-fib is noted to be present.  ECG: I personally reviewed the tracing from 12/02/2015 which showed atrial fibrillation with RVR, borderline increased voltage and repolarization abnormalities.  Medications:   Scheduled Medications: . atorvastatin  80 mg Oral Daily  . furosemide  40 mg Intravenous BID  . metoprolol succinate  75 mg Oral BID  . nitroGLYCERIN  1 inch Topical Q8H  . potassium chloride SA  20 mEq Oral BID  . rivaroxaban  20 mg Oral QPM  . sacubitril-valsartan  1 tablet Oral BID  . sodium chloride flush  3 mL Intravenous Q12H    PRN Medications: sodium chloride, acetaminophen, budesonide, ondansetron (ZOFRAN) IV, sodium  chloride flush   Assessment:   1. Acute on chronic combined heart failure with volume overload complicated by recurring noncompliance. He has had frequent hospitalizations related to this.  2. Nonischemic cardiomyopathy with LVEF 20-25%. Cardiac catheterization in December 2016 revealed normal coronary arteries.  3. Minor increase in troponin I, not consistent with ACS and most likely associated with cardiomyopathy and heart failure.  4. Persistent atrial fibrillation. On Xarelto for stroke prophylaxis. CHADSVASC score is 2.  5. Essential hypertension, recent blood pressure not well controlled.  6. Recurring noncompliance, major contributor to his increased risk for adverse events and death.   Plan/Discussion:    Reviewed recent notes and discussed with patient. Continue Toprol-XL, Lipitor, Entresto, and Xarelto. Agree with present Lasix dose IV with good diuresis so far, replete potassium as needed. If heart rate and blood pressure do not continue to improve, may need further medication adjustments although more optimal volume status should help. Patient is currently in the process of being discharged from the [facility] practice by Dr. Purvis Sheffield, however this has not yet been finalized. We will continue to follow for now.  Jonelle Sidle, M.D., F.A.C.C.

## 2015-12-03 NOTE — Care Management Note (Signed)
Case Management Note  Patient Details  Name: [name] MRN: [id number] Date of Birth: 1966/04/28  Subjective/Objective:                  Pt admitted with CHF. Pt is from home, lives with mother and is active with [place] for [facility] nursing services. Pt also active with the [facility] [place]. Pt states he had to come back to the hospital after taking metalazone due to it making him sick. Pt states he does not know if he had lasix at home to take but he took 2 Rx to pharmacy after DC last weekend and was only given 1 Rx back. Per EMS with the [facility], cardiologist had agree'd to see pt in days preceding this hospitalization in order to prevent pt returning. Pt plans to return home with resumption of HH services. Tim Justis of [name], is aware of admission and DC plan, will obtain pt info from chart.   Action/Plan: Pt plans to return home with resumption of services.   Expected Discharge Date:    12/04/2015              Expected Discharge Plan:  Home w Home Health Services  In-House Referral:  NA  Discharge planning Services  CM Consult  Post Acute Care Choice:  Home Health, Resumption of Svcs/PTA Provider Choice offered to:  Patient  DME Arranged:    DME Agency:     HH Arranged:    HH Agency:     Status of Service:  In process, will continue to follow  Medicare Important Message Given:    Date Medicare IM Given:    Medicare IM give by:    Date Additional Medicare IM Given:    Additional Medicare Important Message give by:     If discussed at Long Length of Stay Meetings, dates discussed:    Additional Comments:  [name], RN 12/03/2015, 4:21 PM

## 2015-12-04 DIAGNOSIS — I5021 Acute systolic (congestive) heart failure: Secondary | ICD-10-CM

## 2015-12-04 LAB — CBC
HCT: 41.3 % (ref 39.0–52.0)
HEMOGLOBIN: 13 g/dL (ref 13.0–17.0)
MCH: 26.5 pg (ref 26.0–34.0)
MCHC: 31.5 g/dL (ref 30.0–36.0)
MCV: 84.3 fL (ref 78.0–100.0)
Platelets: 309 10*3/uL (ref 150–400)
RBC: 4.9 MIL/uL (ref 4.22–5.81)
RDW: 14.9 % (ref 11.5–15.5)
WBC: 3.3 10*3/uL — ABNORMAL LOW (ref 4.0–10.5)

## 2015-12-04 LAB — COMPREHENSIVE METABOLIC PANEL
ALK PHOS: 66 U/L (ref 38–126)
ALT: 23 U/L (ref 17–63)
ANION GAP: 8 (ref 5–15)
AST: 28 U/L (ref 15–41)
Albumin: 4 g/dL (ref 3.5–5.0)
BUN: 22 mg/dL — ABNORMAL HIGH (ref 6–20)
CALCIUM: 9.2 mg/dL (ref 8.9–10.3)
CO2: 31 mmol/L (ref 22–32)
Chloride: 99 mmol/L — ABNORMAL LOW (ref 101–111)
Creatinine, Ser: 1.4 mg/dL — ABNORMAL HIGH (ref 0.61–1.24)
GFR calc non Af Amer: 58 mL/min — ABNORMAL LOW (ref >60)
Glucose, Bld: 95 mg/dL (ref 65–99)
Potassium: 3.3 mmol/L — ABNORMAL LOW (ref 3.5–5.1)
SODIUM: 138 mmol/L (ref 135–145)
TOTAL PROTEIN: 7.3 g/dL (ref 6.5–8.1)
Total Bilirubin: 2.3 mg/dL — ABNORMAL HIGH (ref 0.3–1.2)

## 2015-12-04 MED ORDER — METOLAZONE 5 MG PO TABS: 5.0000 mg | ORAL_TABLET | Freq: Every day | ORAL | Status: DC

## 2015-12-04 NOTE — Discharge Summary (Signed)
Physician Discharge Summary  [name] MRN: [id number] DOB/AGE: 48-Nov-1967 48 y.o.  PCP: Jani Gravel, MD   Admit date: 12/02/2015 Discharge date: 12/04/2015  Discharge Diagnoses:     Active Problems:   CHF (congestive heart failure) (HCC)   Congestive heart failure (CHF) (HCC)   Acute on chronic combined systolic (congestive) and diastolic (congestive) heart failure (HCC)   Nonischemic cardiomyopathy (HCC)   Morbid obesity due to excess calories ([place])    Follow-up recommendations Follow-up with PCP in 3-5 days , including all  additional recommended appointments as below Follow-up CBC, CMP in 3-5 days Follow-up with cardiology       Current Discharge Medication List    CONTINUE these medications which have CHANGED   Details  metolazone (ZAROXOLYN) 5 MG tablet Take 1 tablet (5 mg total) by mouth daily. Qty: 1 tablet, Refills: 0      CONTINUE these medications which have NOT CHANGED   Details  atorvastatin (LIPITOR) 80 MG tablet Take 80 mg by mouth daily.    beclomethasone (QVAR) 80 MCG/ACT inhaler Inhale 2 puffs into the lungs daily as needed (shortness of breath). Qty: 1 Inhaler, Refills: 12    furosemide (LASIX) 80 MG tablet Take 1 tablet (80 mg total) by mouth 2 (two) times daily. Qty: 180 tablet, Refills: 1    metoprolol succinate (TOPROL-XL) 25 MG 24 hr tablet Take 3 tablets (75 mg total) by mouth 2 (two) times daily. Take with or immediately following a meal. Qty: 180 tablet, Refills: 0    potassium chloride SA (K-DUR,KLOR-CON) 20 MEQ tablet Take 20 mEq by mouth 2 (two) times daily.    rivaroxaban (XARELTO) 20 MG TABS tablet Take 1 tablet (20 mg total) by mouth every evening. Qty: 30 tablet, Refills: 2    sacubitril-valsartan (ENTRESTO) 24-26 MG Take 1 tablet by mouth 2 (two) times daily. Qty: 60 tablet, Refills: 1         Discharge Condition: Stable   Discharge Instructions Get Medicines reviewed and adjusted: Please take all your medications  with you for your next visit with your Primary MD  Please request your Primary MD to go over all hospital tests and procedure/radiological results at the follow up, please ask your Primary MD to get all Hospital records sent to his/her office.  If you experience worsening of your admission symptoms, develop shortness of breath, life threatening emergency, suicidal or homicidal thoughts you must seek medical attention immediately by calling 911 or calling your MD immediately if symptoms less severe.  You must read complete instructions/literature along with all the possible adverse reactions/side effects for all the Medicines you take and that have been prescribed to you. Take any new Medicines after you have completely understood and accpet all the possible adverse reactions/side effects.   Do not drive when taking Pain medications.   Do not take more than prescribed Pain, Sleep and Anxiety Medications  Special Instructions: If you have smoked or chewed Tobacco in the last 2 yrs please stop smoking, stop any regular Alcohol and or any Recreational drug use.  Wear Seat belts while driving.  Please note  You were cared for by a hospitalist during your hospital stay. Once you are discharged, your primary care physician will handle any further medical issues. Please note that NO REFILLS for any discharge medications will be authorized once you are discharged, as it is imperative that you return to your primary care physician (or establish a relationship with a primary care physician if you do  not have one) for your aftercare needs so that they can reassess your need for medications and monitor your lab values.  Discharge Instructions    Diet - low sodium heart healthy    Complete by:  As directed      Increase activity slowly    Complete by:  As directed             Allergies  Allergen Reactions  . Bee Venom Shortness Of Breath and Swelling  . Lipitor [Atorvastatin] Shortness Of Breath  and Other (See Comments)    Nose bleed  . Aspirin Other (See Comments)    Chewable 81 mg tablets upsets patients stomach  . Banana Diarrhea  . Diltiazem Other (See Comments)    Makes heart beat fast  . Hydralazine Other (See Comments)    Patient states it causes Tachycardia      Disposition: 01-Home or Self Care   Consults:  Cardiology   Significant Diagnostic Studies:  Dg Chest 2 View  12/02/2015  CLINICAL DATA:  Increasing shortness of breath and lower extremity swelling over 1 day. Previous smoker. History of CHF and asthma. EXAM: CHEST  2 VIEW COMPARISON:  11/25/2015. FINDINGS: Cardiac enlargement. Suggestion of mild central vascular congestion. Mild perihilar edema. Changes appear to be improving since previous study. No blunting of costophrenic angles. No pneumothorax. Mediastinal contours appear intact. IMPRESSION: Improving congestive changes since previous study. Decreasing perihilar edema. Electronically Signed   By: Lucienne Capers M.D.   On: 12/02/2015 23:43   Dg Chest 2 View  11/25/2015  CLINICAL DATA:  Shortness of breath EXAM: CHEST  2 VIEW COMPARISON:  Nov 17, 2015 FINDINGS: Stable cardiomegaly. Mild a moderate pulmonary edema has improved since Nov 17, 2015. No pneumothorax. No other interval changes. IMPRESSION: Persistent but improving pulmonary edema.  Cardiomegaly. Electronically Signed   By: Dorise Bullion III M.D   On: 11/25/2015 13:27   Dg Chest 2 View  11/17/2015  CLINICAL DATA:  Chest pain and short of breath EXAM: CHEST  2 VIEW COMPARISON:  11/05/2015 FINDINGS: Cardiac enlargement with vascular congestion. Progression of bibasilar airspace disease. Small amount of fluid in the fissure. No layering pleural effusion. IMPRESSION: Congestive heart failure with pulmonary edema which has progressed in the interval. Progression of bibasilar airspace disease compatible with edema/infiltrate. Electronically Signed   By: Franchot Gallo M.D.   On: 11/17/2015 21:18   Dg  Chest 2 View  11/05/2015  CLINICAL DATA:  Shortness of Breath EXAM: CHEST  2 VIEW COMPARISON:  Oct 31, 2015 FINDINGS: There is mild atelectatic change in the right lower lobe region. Lungs elsewhere clear. There is cardiomegaly with pulmonary vascularity within normal limits. No adenopathy. There is mild degenerative change in the thoracic spine. IMPRESSION: Mild right base atelectasis. Lungs elsewhere clear. Stable cardiomegaly. Electronically Signed   By: Lowella Grip III M.D.   On: 11/05/2015 10:52   Ct Chest Wo Contrast  11/05/2015  CLINICAL DATA:  Shortness of breath and chest pain for 3 days, initial encounter EXAM: CT CHEST WITHOUT CONTRAST TECHNIQUE: Multidetector CT imaging of the chest was performed following the standard protocol without IV contrast. COMPARISON:  09/19/2015 FINDINGS: The lungs are well aerated bilaterally. Patchy sub solid infiltrates are noted bilaterally worse on the right than the left. No sizable effusion is seen. The thoracic inlet is within normal limits. The thoracic aorta and pulmonary artery as visualized are within normal limits although no contrast was administered. No significant hilar or mediastinal adenopathy is noted  at this time. A few small scattered mediastinal lymph nodes are noted which are not significant by size criteria. The visualized upper abdomen is within normal limits. The bony structures show no acute abnormality. IMPRESSION: Diffuse patchy ground-glass infiltrates throughout both lungs worse on the right than the left. Given the patient's clinical history of lower extremity swelling this may represent some acute alveolar edema although a an acute infectious etiology deserves consideration as well. Followup examination is recommended following appropriate therapy. Electronically Signed   By: Inez Catalina M.D.   On: 11/05/2015 13:14         Filed Weights   12/03/15 0938 12/04/15 0519 12/04/15 0748  Weight: 130.772 kg (288 lb 4.8 oz) 123.605 kg  (272 lb 8 oz) 124.694 kg (274 lb 14.4 oz)     Microbiology: No results found for this or any previous visit (from the past 240 hour(s)).     Blood Culture    Component Value Date/Time   SDES BLOOD RIGHT HAND 10/29/2015 0618   SPECREQUEST BOTTLES DRAWN AEROBIC ONLY [place] 10/29/2015 0618   CULT NO GROWTH 5 DAYS 10/29/2015 0618   REPTSTATUS 11/03/2015 FINAL 10/29/2015 0618      Labs: Results for orders placed or performed during the hospital encounter of 12/02/15 (from the past 48 hour(s))  CBC with Differential     Status: Abnormal   Collection Time: 12/02/15 10:38 PM  Result Value Ref Range   WBC 3.3 (L) 4.0 - 10.5 K/uL   RBC 4.08 (L) 4.22 - 5.81 MIL/uL   Hemoglobin 11.3 (L) 13.0 - 17.0 g/dL   HCT 34.6 (L) 39.0 - 52.0 %   MCV 84.8 78.0 - 100.0 fL   MCH 27.7 26.0 - 34.0 pg   MCHC 32.7 30.0 - 36.0 g/dL   RDW 15.2 11.5 - 15.5 %   Platelets 251 150 - 400 K/uL   Neutrophils Relative % 56 %   Neutro Abs 1.8 1.7 - 7.7 K/uL   Lymphocytes Relative 36 %   Lymphs Abs 1.2 0.7 - 4.0 K/uL   Monocytes Relative 5 %   Monocytes Absolute 0.2 0.1 - 1.0 K/uL   Eosinophils Relative 3 %   Eosinophils Absolute 0.1 0.0 - 0.7 K/uL   Basophils Relative 0 %   Basophils Absolute 0.0 0.0 - 0.1 K/uL  Troponin I     Status: Abnormal   Collection Time: 12/02/15 10:38 PM  Result Value Ref Range   Troponin I 0.06 (H) <0.031 ng/mL    Comment:        PERSISTENTLY INCREASED TROPONIN VALUES IN THE RANGE OF 0.04-0.49 ng/mL CAN BE SEEN IN:       -UNSTABLE ANGINA       -CONGESTIVE HEART FAILURE       -MYOCARDITIS       -CHEST TRAUMA       -ARRYHTHMIAS       -LATE PRESENTING MYOCARDIAL INFARCTION       -COPD   CLINICAL FOLLOW-UP RECOMMENDED.   Brain natriuretic peptide     Status: Abnormal   Collection Time: 12/02/15 10:38 PM  Result Value Ref Range   B Natriuretic Peptide 820.0 (H) 0.0 - 100.0 pg/mL  Basic metabolic panel     Status: Abnormal   Collection Time: 12/02/15 10:38 PM  Result Value  Ref Range   Sodium 137 135 - 145 mmol/L   Potassium 3.9 3.5 - 5.1 mmol/L   Chloride 108 101 - 111 mmol/L   CO2 24 22 - 32  mmol/L   Glucose, Bld 92 65 - 99 mg/dL   BUN 24 (H) 6 - 20 mg/dL   Creatinine, Ser 1.22 0.61 - 1.24 mg/dL   Calcium 8.6 (L) 8.9 - 10.3 mg/dL   GFR calc non Af Amer >60 >60 mL/min   GFR calc Af Amer >60 >60 mL/min    Comment: (NOTE) The eGFR has been calculated using the CKD EPI equation. This calculation has not been validated in all clinical situations. eGFR's persistently <60 mL/min signify possible Chronic Kidney Disease.    Anion gap 5 5 - 15  CBC WITH DIFFERENTIAL     Status: Abnormal   Collection Time: 12/03/15  4:33 AM  Result Value Ref Range   WBC 3.6 (L) 4.0 - 10.5 K/uL   RBC 4.48 4.22 - 5.81 MIL/uL   Hemoglobin 12.2 (L) 13.0 - 17.0 g/dL   HCT 38.0 (L) 39.0 - 52.0 %   MCV 84.8 78.0 - 100.0 fL   MCH 27.2 26.0 - 34.0 pg   MCHC 32.1 30.0 - 36.0 g/dL   RDW 15.1 11.5 - 15.5 %   Platelets 254 150 - 400 K/uL   Neutrophils Relative % 53 %   Neutro Abs 1.9 1.7 - 7.7 K/uL   Lymphocytes Relative 36 %   Lymphs Abs 1.3 0.7 - 4.0 K/uL   Monocytes Relative 9 %   Monocytes Absolute 0.3 0.1 - 1.0 K/uL   Eosinophils Relative 2 %   Eosinophils Absolute 0.1 0.0 - 0.7 K/uL   Basophils Relative 0 %   Basophils Absolute 0.0 0.0 - 0.1 K/uL  Comprehensive metabolic panel     Status: Abnormal   Collection Time: 12/03/15  4:33 AM  Result Value Ref Range   Sodium 141 135 - 145 mmol/L   Potassium 3.2 (L) 3.5 - 5.1 mmol/L    Comment: DELTA CHECK NOTED   Chloride 103 101 - 111 mmol/L   CO2 28 22 - 32 mmol/L   Glucose, Bld 90 65 - 99 mg/dL   BUN 21 (H) 6 - 20 mg/dL   Creatinine, Ser 1.25 (H) 0.61 - 1.24 mg/dL   Calcium 9.2 8.9 - 10.3 mg/dL   Total Protein 7.2 6.5 - 8.1 g/dL   Albumin 4.0 3.5 - 5.0 g/dL   AST 27 15 - 41 U/L   ALT 23 17 - 63 U/L   Alkaline Phosphatase 66 38 - 126 U/L   Total Bilirubin 2.1 (H) 0.3 - 1.2 mg/dL   GFR calc non Af Amer >60 >60 mL/min    GFR calc Af Amer >60 >60 mL/min    Comment: (NOTE) The eGFR has been calculated using the CKD EPI equation. This calculation has not been validated in all clinical situations. eGFR's persistently <60 mL/min signify possible Chronic Kidney Disease.    Anion gap 10 5 - 15  Troponin I     Status: Abnormal   Collection Time: 12/03/15  4:33 AM  Result Value Ref Range   Troponin I 0.05 (H) <0.031 ng/mL    Comment:        PERSISTENTLY INCREASED TROPONIN VALUES IN THE RANGE OF 0.04-0.49 ng/mL CAN BE SEEN IN:       -UNSTABLE ANGINA       -CONGESTIVE HEART FAILURE       -MYOCARDITIS       -CHEST TRAUMA       -ARRYHTHMIAS       -LATE PRESENTING MYOCARDIAL INFARCTION       -COPD  CLINICAL FOLLOW-UP RECOMMENDED.   Troponin I     Status: Abnormal   Collection Time: 12/03/15 10:03 AM  Result Value Ref Range   Troponin I 0.05 (H) <0.031 ng/mL    Comment:        PERSISTENTLY INCREASED TROPONIN VALUES IN THE RANGE OF 0.04-0.49 ng/mL CAN BE SEEN IN:       -UNSTABLE ANGINA       -CONGESTIVE HEART FAILURE       -MYOCARDITIS       -CHEST TRAUMA       -ARRYHTHMIAS       -LATE PRESENTING MYOCARDIAL INFARCTION       -COPD   CLINICAL FOLLOW-UP RECOMMENDED.   Troponin I     Status: Abnormal   Collection Time: 12/03/15  3:04 PM  Result Value Ref Range   Troponin I 0.05 (H) <0.031 ng/mL    Comment:        PERSISTENTLY INCREASED TROPONIN VALUES IN THE RANGE OF 0.04-0.49 ng/mL CAN BE SEEN IN:       -UNSTABLE ANGINA       -CONGESTIVE HEART FAILURE       -MYOCARDITIS       -CHEST TRAUMA       -ARRYHTHMIAS       -LATE PRESENTING MYOCARDIAL INFARCTION       -COPD   CLINICAL FOLLOW-UP RECOMMENDED.   Comprehensive metabolic panel     Status: Abnormal   Collection Time: 12/04/15  6:14 AM  Result Value Ref Range   Sodium 138 135 - 145 mmol/L   Potassium 3.3 (L) 3.5 - 5.1 mmol/L   Chloride 99 (L) 101 - 111 mmol/L   CO2 31 22 - 32 mmol/L   Glucose, Bld 95 65 - 99 mg/dL   BUN 22 (H) 6 -  20 mg/dL   Creatinine, Ser 1.40 (H) 0.61 - 1.24 mg/dL   Calcium 9.2 8.9 - 10.3 mg/dL   Total Protein 7.3 6.5 - 8.1 g/dL   Albumin 4.0 3.5 - 5.0 g/dL   AST 28 15 - 41 U/L   ALT 23 17 - 63 U/L   Alkaline Phosphatase 66 38 - 126 U/L   Total Bilirubin 2.3 (H) 0.3 - 1.2 mg/dL   GFR calc non Af Amer 58 (L) >60 mL/min   GFR calc Af Amer >60 >60 mL/min    Comment: (NOTE) The eGFR has been calculated using the CKD EPI equation. This calculation has not been validated in all clinical situations. eGFR's persistently <60 mL/min signify possible Chronic Kidney Disease.    Anion gap 8 5 - 15  CBC     Status: Abnormal   Collection Time: 12/04/15  6:14 AM  Result Value Ref Range   WBC 3.3 (L) 4.0 - 10.5 K/uL   RBC 4.90 4.22 - 5.81 MIL/uL   Hemoglobin 13.0 13.0 - 17.0 g/dL   HCT 41.3 39.0 - 52.0 %   MCV 84.3 78.0 - 100.0 fL   MCH 26.5 26.0 - 34.0 pg   MCHC 31.5 30.0 - 36.0 g/dL   RDW 14.9 11.5 - 15.5 %   Platelets 309 150 - 400 K/uL     Lipid Panel     Component Value Date/Time   CHOL 139 06/14/2015 0328   TRIG 58 06/14/2015 0328   HDL 28* 06/14/2015 0328   CHOLHDL 5.0 06/14/2015 0328   VLDL 12 06/14/2015 0328   LDLCALC 99 06/14/2015 0328     Lab Results  Component Value Date  HGBA1C 6.2* 09/29/2015   HGBA1C 5.8* 12/07/2014     Lab Results  Component Value Date   LDLCALC 99 06/14/2015   CREATININE 1.40* 12/04/2015    49 y.o. male who presents for ongoing assessment and management of chronic combined CHF secondary to medical and dietary non-compliance with frequent admissions to the hospital and ER for decompensated heart failure. He has EF of 20-25%, with dry wt of 283 lbs. He also has a history of atrial fib on Xarelto, and morbid obesity, last seen by cardiology on 12/02/15, comes in today with shortness of breath and worsening leg edema. Weight is up to 301 pounds. Creatinine stable around baseline 1.2. Potassium 3.2 this morning. Patient evaluated by  cardiology  Hospital course Acute on chronic combined heart failure with volume overload complicated by recurring noncompliance. He has had frequent hospitalizations related to this. EF 20-25%, cardiac catheterization in December 2016 with normal coronaries. He has had frequent hospitalizations related to this-just here last week and never filled his Rx. Pending decision by cardiology about termination with their practice due to noncompliance.Diuresed 4335 yest, 8.2 L total on Lasix 40 mg IV BID, replaced K. Weight down 301 lb to 274 lbs. Emphasized the need for compliance again and again  Persistent atrial fibrillation-continue On Xarelto for stroke prophylaxis. CHADSVASC score is 2.  Essential hypertension  recent blood pressure not well controlled    Discharge Exam:   Blood pressure 121/80, pulse 98, temperature 99 F (37.2 C), temperature source Oral, resp. rate 18, height 6' (1.829 m), weight 124.694 kg (274 lb 14.4 oz), SpO2 99 %.   JVP normal no bruits no thyromegaly Lungs clear with no wheezing and good diaphragmatic motion Heart: S1/S2 no murmur, no rub, gallop or click PMI normal Abdomen: benighn, BS positve, no tenderness, no AAA no bruit. No HSM or HJR Distal pulses intact with no bruits Plus 2 LE edema   Follow-up Information    Follow up with Jani Gravel, MD In 1 week.   Specialty:  Internal Medicine   Contact information:   [place] [address] [phone]       Follow up with Kate Sable, MD. Schedule an appointment as soon as possible for a visit in 3 days.   Specialty:  Cardiology   Why:  Hospital follow-up   Contact information:   [place] [address] [phone]       Signed: Reyne Dumas 12/04/2015, 10:24 AM        Time spent >45 mins

## 2015-12-04 NOTE — Progress Notes (Signed)
Subjective:  Says he put out a lot of fluid an is feeling better  Objective:  Vital Signs in the last 24 hours: Temp:  [97.6 F (36.4 C)-99 F (37.2 C)] 99 F (37.2 C) (06/14 0528) Pulse Rate:  [79-98] 98 (06/14 0528) Resp:  [18-20] 18 (06/14 0528) BP: (113-136)/(58-88) 121/80 mmHg (06/14 0528) SpO2:  [97 %-99 %] 99 % (06/14 0528) Weight:  [272 lb 8 oz (123.605 kg)-288 lb 4.8 oz (130.772 kg)] 274 lb 14.4 oz (124.694 kg) (06/14 0748)  Intake/Output from previous day: 06/13 0701 - 06/14 0700 In: 840 [P.O.:840] Out: 5175 [Urine:5175] Intake/Output from this shift:    Physical Exam: NECK: increased JVD, HJR, or bruit LUNGS: Decreased breath sounds but Clear anterior, posterior, lateral HEART: Regular rate and rhythm, no murmur, gallop, rub, bruit, thrill, or heave EXTREMITIES: Without cyanosis, clubbing, plus 2-3 edema Telemetry: Afib with CVR  Lab Results:  Recent Labs  12/03/15 0433 12/04/15 0614  WBC 3.6* 3.3*  HGB 12.2* 13.0  PLT 254 309    Recent Labs  12/03/15 0433 12/04/15 0614  NA 141 138  K 3.2* 3.3*  CL 103 99*  CO2 28 31  GLUCOSE 90 95  BUN 21* 22*  CREATININE 1.25* 1.40*    Recent Labs  12/03/15 1003 12/03/15 1504  TROPONINI 0.05* 0.05*   Hepatic Function Panel  Recent Labs  12/04/15 0614  PROT 7.3  ALBUMIN 4.0  AST 28  ALT 23  ALKPHOS 66  BILITOT 2.3*   No results for input(s): CHOL in the last 72 hours. No results for input(s): PROTIME in the last 72 hours.    Cardiac Studies: Echocardiogram 09/30/2015: Study Conclusions   - Left ventricle: The cavity size was normal. There was severe   concentric hypertrophy. Systolic function was severely reduced.   The estimated ejection fraction was in the range of 20% to 25%.   Diffuse hypokinesis. The study is not technically sufficient to   allow evaluation of LV diastolic function. - Mitral valve: Mildly thickened leaflets . There was trivial   regurgitation. - Left atrium:  Moderately dilated at 47 ml/m2. - Right ventricle: The cavity size was mildly dilated. Systolic   function was normal. - Right atrium: Severely dilated at 29 cm2. - Tricuspid valve: There was mild regurgitation. - Pulmonary arteries: PA peak pressure: 49 mm Hg (S). - Inferior vena cava: The vessel was dilated. The respirophasic   diameter changes were blunted (< 50%), consistent with elevated   central venous pressure.   Impressions:   - Compared to a prior study in 05/2015, the LVEF has declined to   20-25% with global hypokinesis. A-fib is noted to be present.  Assessment/Plan:  1. Acute on chronic combined heart failure with volume overload complicated by recurring noncompliance. He has had frequent hospitalizations related to this-just here last week and never filled his Rx.In the process of being discharged from our practice, but not yet finalized.Diuresed 4335 yest, 8.2 L total on Lasix 40 mg IV BID, replace K. Weight down 301 lb to 274 lbs.  K 3.3, Crt 1.4 Close to discharge. Needs some type of home health to help manage his meds and compliance.  2. Nonischemic cardiomyopathy with LVEF 20-25%. Cardiac catheterization in December 2016 revealed normal coronary arteries.  3. Minor increase in troponin I, not consistent with ACS and most likely associated with cardiomyopathy and heart failure.  4. Persistent atrial fibrillation. On Xarelto for stroke prophylaxis. CHADSVASC score is 2.  5. Essential hypertension,  recent blood pressure not well controlled.  6. Recurring noncompliance, major contributor to his increased risk for adverse events and death.   LOS: 1 day    Jacolyn Reedy 12/04/2015, 8:11 AM   Patient seems to be using hospital as a hotel.  CHF improved when he is actually on medication LE edema improved Not clear that he ever takes xarelto.  Lungs clear Obese black male with sun Glasses on.  No CAD by cath 2016  Agree that [facility] should be involved to try to keep him out  of hospital  [facility]

## 2015-12-04 NOTE — Care Management Important Message (Signed)
Important Message  Patient Details  Name: [name] MRN: [id number] Date of Birth: 09-27-1965   Medicare Important Message Given:  Yes    [name], RN 12/04/2015, 11:36 AM

## 2015-12-04 NOTE — Care Management Note (Signed)
Case Management Note  Patient Details  Name: [name] MRN: [id number] Date of Birth: May 08, 1966   Expected Discharge Date:    12/04/2015              Expected Discharge Plan:  Home w Home Health Services  In-House Referral:  NA  Discharge planning Services  CM Consult  Post Acute Care Choice:  Home Health, Resumption of Svcs/PTA Provider Choice offered to:  Patient  DME Arranged:    DME Agency:     HH Arranged:    HH Agency:  [name]  Status of Service:  Completed, signed off  Medicare Important Message Given:    Date Medicare IM Given:    Medicare IM give by:    Date Additional Medicare IM Given:    Additional Medicare Important Message give by:     If discussed at Long Length of Stay Meetings, dates discussed:    Additional Comments: Pt discharging home today with resumption of HH services through [facility]. Pt aware HH has 48 hours to resume services. Tim Justis of [name] made aware of referral and will obtain pt info from chart. Pt DC summary faxed to Wyatt Haste at the [facility].   [name], RN 12/04/2015, 11:34 AM

## 2015-12-08 IMAGING — CR DG CHEST 2V
3 series · 3 of 3 positions shown · non-contrast
Comparison: 11/27/2012.

CLINICAL DATA: Shortness of breath.  Cough for 1 week.

EXAM:
CHEST  2 VIEW

[view not recorded (1 of 3)]
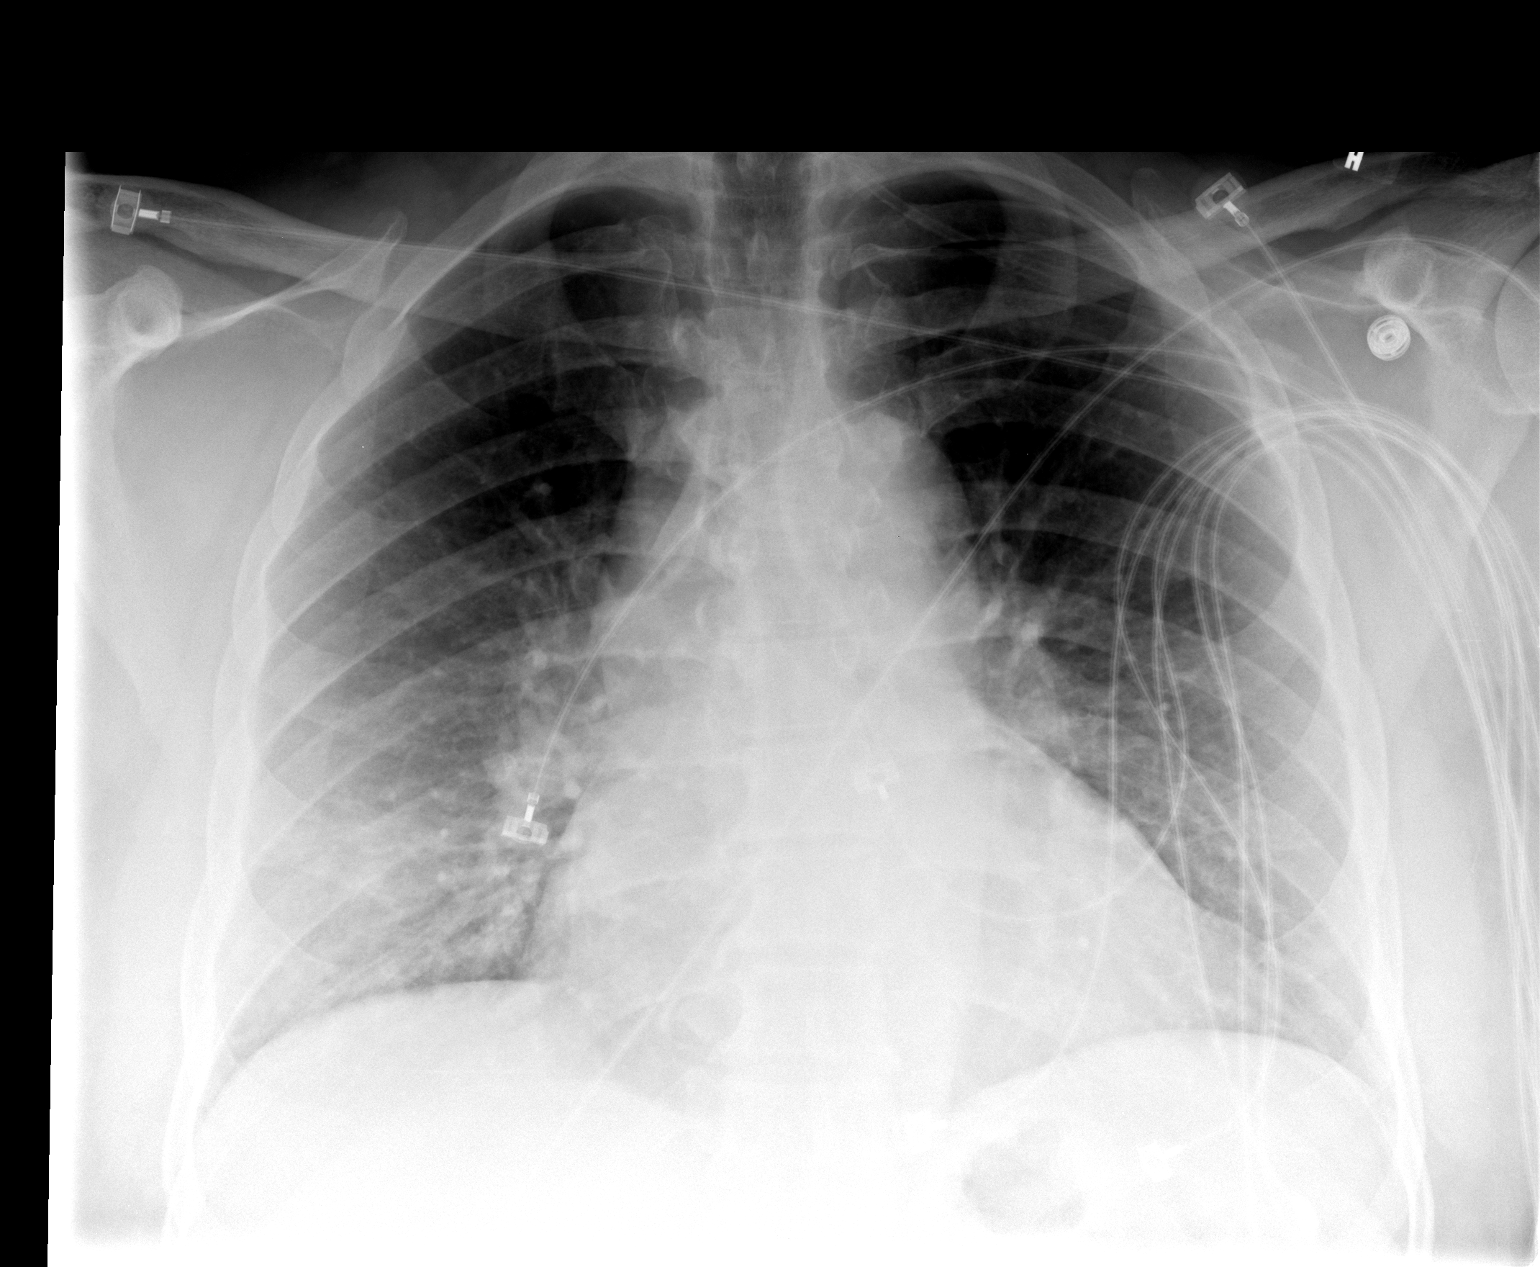

[view not recorded (2 of 3)]
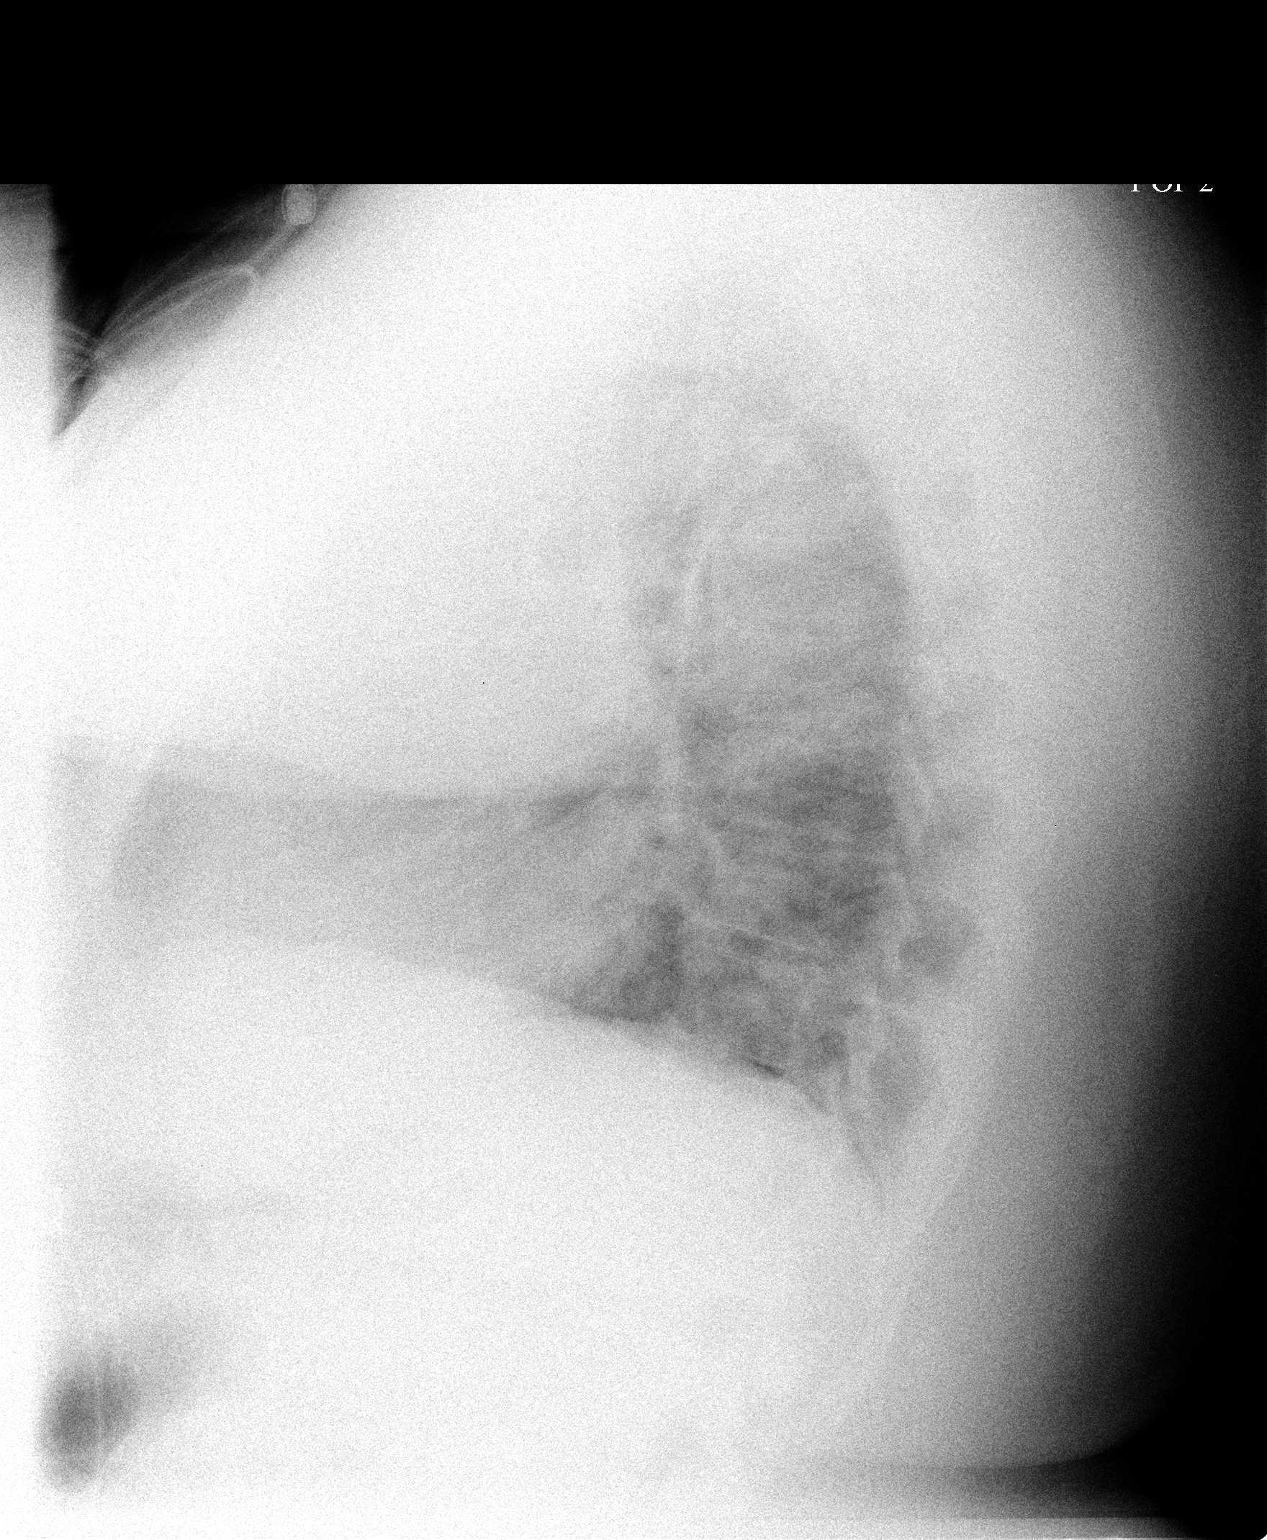

[view not recorded (3 of 3)]
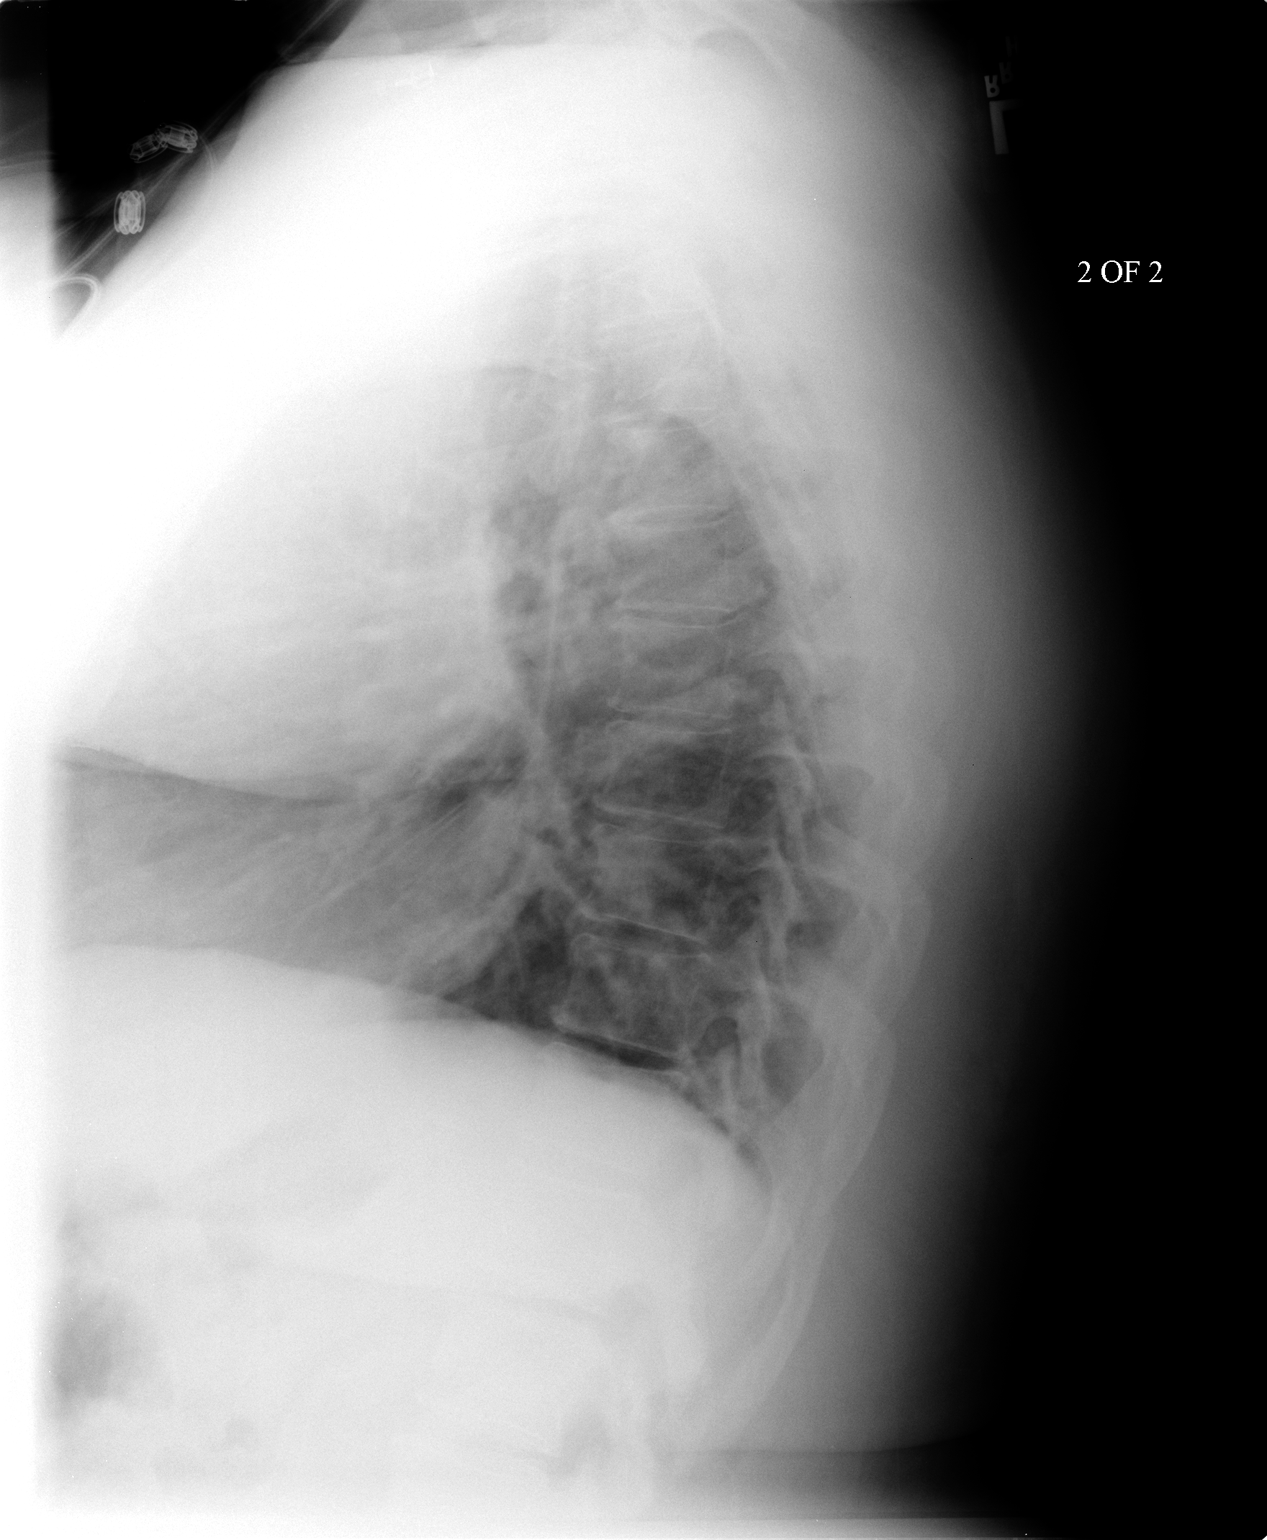

[3 of 3 positions shown; findings below may reference images not displayed]

FINDINGS: Mild cardiomegaly for projection. Monitoring leads project over the
chest. Basilar atelectasis. Suboptimal lateral view because of body
habitus and arm position. No pleural effusion. No airspace opacity.
Pulmonary vascular congestion is present without alveolar pulmonary
edema.
IMPRESSION: Cardiomegaly and pulmonary vascular congestion.

## 2015-12-09 IMAGING — CR DG CHEST 2V
2 series · 2 of 2 positions shown · non-contrast
Comparison: 04/02/2014

CLINICAL DATA: Fever for 2 days. No chest pain, shortness of
breath. Fluid around the heart. Acute CHF, hypertension,
hyperlipidemia, atrial fibrillation, asthma.

EXAM:
CHEST  2 VIEW

[view not recorded (1 of 2)]
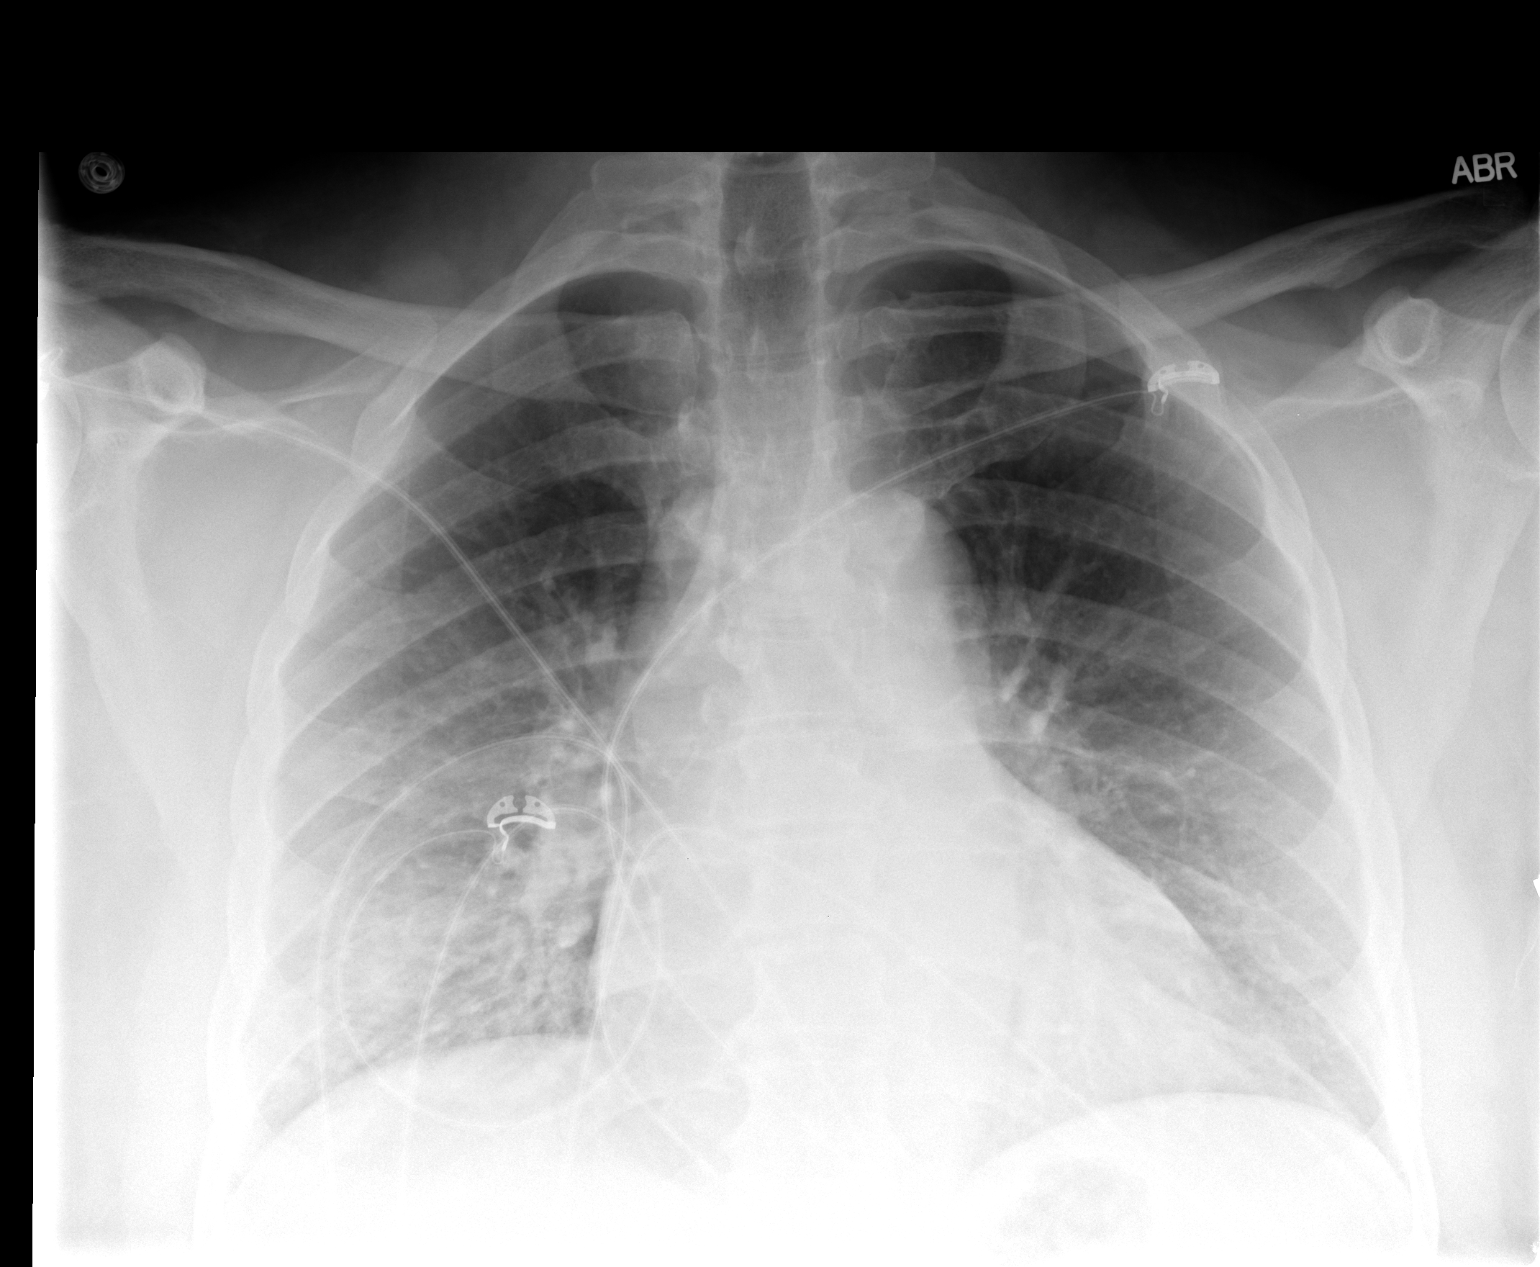

[view not recorded (2 of 2)]
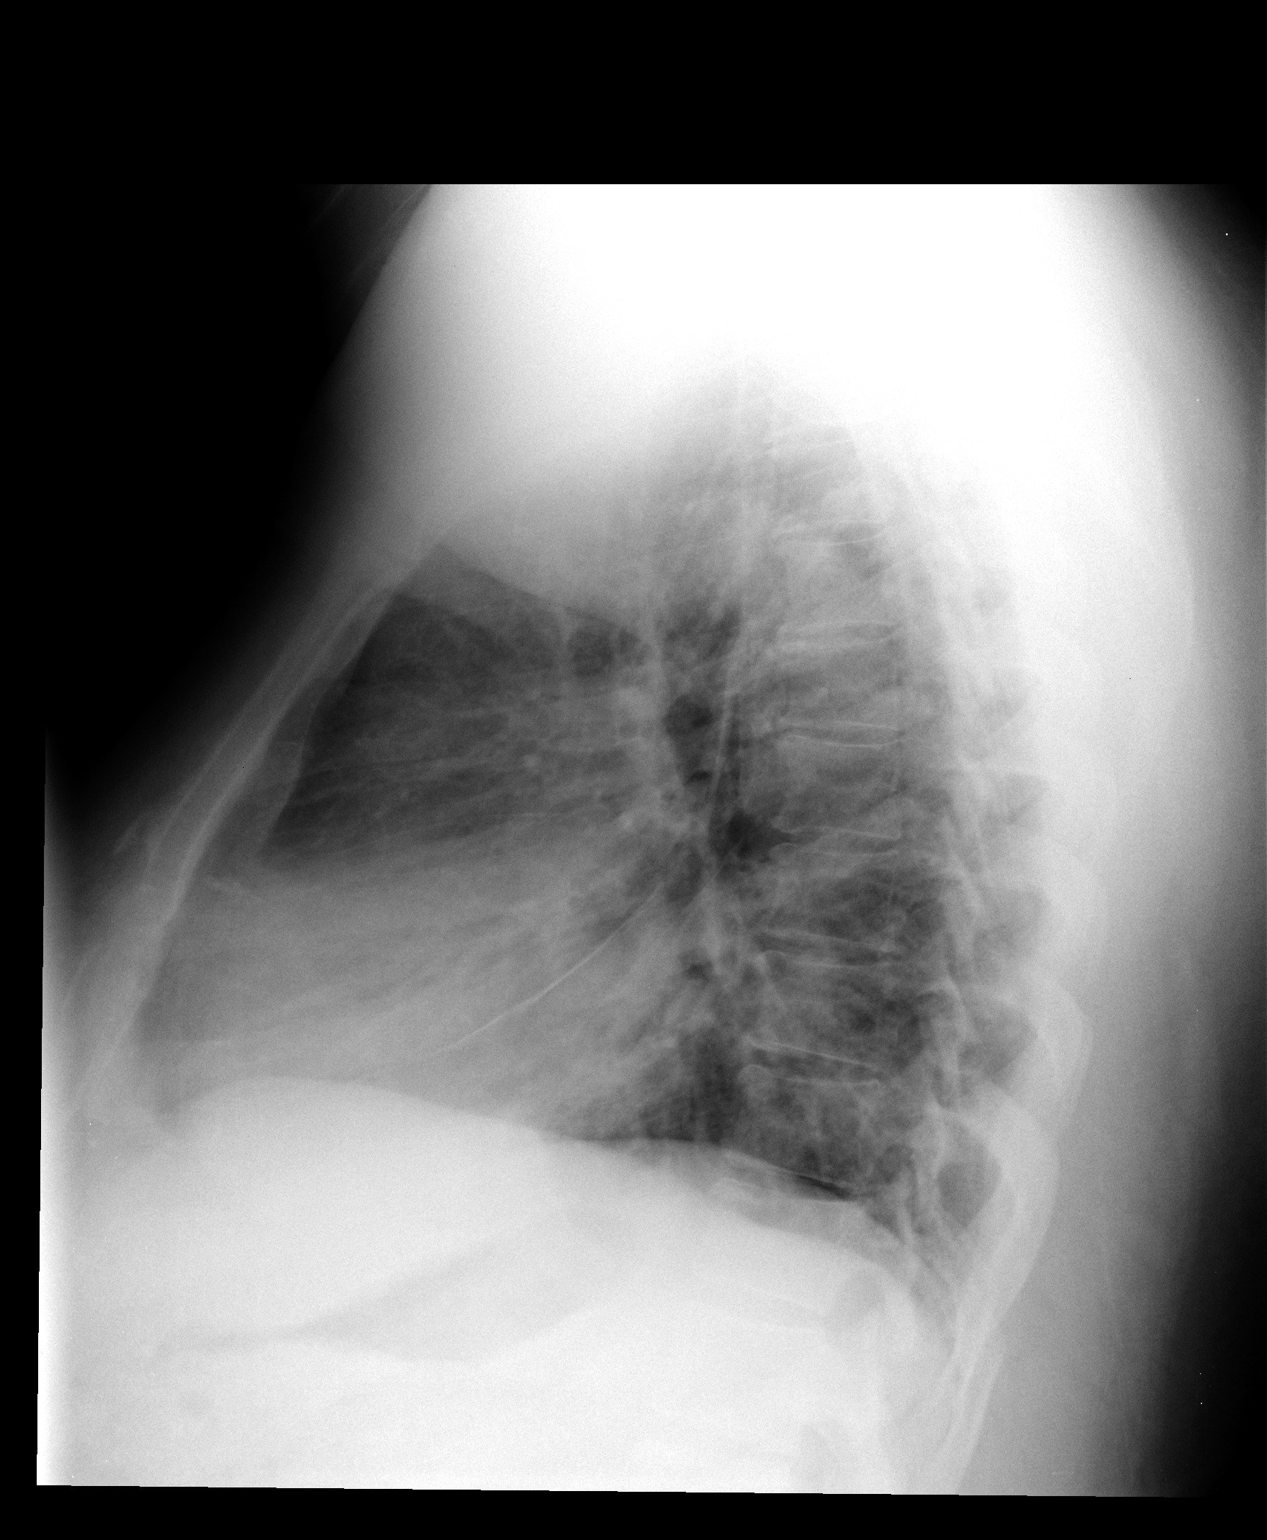

[2 of 2 positions shown; findings below may reference images not displayed]

FINDINGS: Cardiac silhouette is enlarged and stable. There changes of
pulmonary vascular congestion without overt edema. There is
asymmetric density at the right lung base raising the question of
early infiltrate.
IMPRESSION: Cardiomegaly and vascular congestion.

Possible right lower lobe infiltrate.

## 2015-12-11 NOTE — Telephone Encounter (Signed)
Received signed returned receipt 12/04/15

## 2015-12-14 ENCOUNTER — Encounter (HOSPITAL_COMMUNITY): Payer: Self-pay

## 2015-12-14 ENCOUNTER — Emergency Department (HOSPITAL_COMMUNITY)
Admission: EM | Admit: 2015-12-14 | Discharge: 2015-12-15 | Disposition: A | Discharge status: Home / Self Care | Payer: Medicare Other | Attending: Emergency Medicine | Admitting: Emergency Medicine

## 2015-12-14 DIAGNOSIS — J45909 Unspecified asthma, uncomplicated: Secondary | ICD-10-CM | POA: Insufficient documentation

## 2015-12-14 DIAGNOSIS — Z87891 Personal history of nicotine dependence: Secondary | ICD-10-CM | POA: Diagnosis not present

## 2015-12-14 DIAGNOSIS — I5033 Acute on chronic diastolic (congestive) heart failure: Secondary | ICD-10-CM | POA: Insufficient documentation

## 2015-12-14 DIAGNOSIS — Z79899 Other long term (current) drug therapy: Secondary | ICD-10-CM | POA: Diagnosis not present

## 2015-12-14 DIAGNOSIS — I110 Hypertensive heart disease with heart failure: Secondary | ICD-10-CM | POA: Diagnosis not present

## 2015-12-14 DIAGNOSIS — R609 Edema, unspecified: Secondary | ICD-10-CM

## 2015-12-14 DIAGNOSIS — M1990 Unspecified osteoarthritis, unspecified site: Secondary | ICD-10-CM | POA: Insufficient documentation

## 2015-12-14 DIAGNOSIS — I4891 Unspecified atrial fibrillation: Secondary | ICD-10-CM | POA: Insufficient documentation

## 2015-12-14 DIAGNOSIS — M7989 Other specified soft tissue disorders: Secondary | ICD-10-CM | POA: Diagnosis present

## 2015-12-14 NOTE — ED Provider Notes (Signed)
CSN: [id number]     Arrival date & time 12/14/15  2242 History  By signing my name below, I, Bethel Born, attest that this documentation has been prepared under the direction and in the presence of Devoria Albe, MD at 00:02 AM. Electronically Signed: Bethel Born, ED Scribe. 12/15/2015. 12:13 AM    Chief Complaint  Patient presents with  . Leg Swelling  . Shortness of Breath    The history is provided by the patient. No language interpreter was used.   [name] is a 50 y.o. male with PMHx of CHF, a-fib, nonischemic cardiomyopathy, and asthma who presents to the Emergency Department complaining of constant SOB with onset 2 days ago. Laying flat and exertion exacerbate his breathing. Pt states that he can walk 2 feet without issue.  Associated symptoms include racing and irregular palpitations, chest tightness when he feels SOB, and cough productive of clear sputum, and leg swelling. He denies eating anything that he should not have and has been taking his fluid pills, though he does not believe that they are working.  .   Dr. Selena Batten is his PCP. Cardiology Dr Darl Householder  Past Medical History  Diagnosis Date  . Nonischemic cardiomyopathy (HCC)     LVEF 20-25%  . History of cardiac catheterization     Normal coronaries December 2016  . Essential hypertension   . Gastroesophageal reflux disease   . Osteoarthritis   . Peptic ulcer disease   . OSA (obstructive sleep apnea)   . Allergic rhinitis   . Atrial fibrillation (HCC)   . Asthma   . Noncompliance     Major problem leading to declining health and recurrent hospitalization   Past Surgical History  Procedure Laterality Date  . Cardiac catheterization N/A 06/14/2015    Procedure: Right/Left Heart Cath and Coronary Angiography;  Surgeon: Runell Gess, MD;  Location: [facility] INVASIVE CV LAB;  Service: Cardiovascular;  Laterality: N/A;   Family History  Problem Relation Age of Onset  . Colon cancer Neg Hx   . Inflammatory  bowel disease Neg Hx   . Liver disease Neg Hx   . Stroke Father   . Heart attack Father   . Aneurysm Mother     Cerebral aneurysm  . Hypertension Sister    Social History  Substance Use Topics  . Smoking status: Former Smoker -- 0.50 packs/day for 20 years    Types: Cigarettes    Start date: 04/26/1988    Quit date: 06/23/2007  . Smokeless tobacco: Never Used     Comment: 1 ppd former smoker  . Alcohol Use: No     Comment: No etoh since 2009  Pt denies smoking and alcohol use.  Pt lives with his mother and children.  He no longer works as he is on Disability  Review of Systems  Respiratory: Positive for cough, chest tightness and shortness of breath.   Cardiovascular: Positive for palpitations and leg swelling.  All other systems reviewed and are negative.  Allergies  Bee venom; Lipitor; Aspirin; Banana; Diltiazem; and Hydralazine  Home Medications   Prior to Admission medications   Medication Sig Start Date End Date Taking? Authorizing Provider  atorvastatin (LIPITOR) 80 MG tablet Take 80 mg by mouth daily.    Historical Provider, MD  beclomethasone (QVAR) 80 MCG/ACT inhaler Inhale 2 puffs into the lungs daily as needed (shortness of breath). 11/21/15   Erick Blinks, MD  furosemide (LASIX) 80 MG tablet Take 1 tablet (80 mg total) by mouth  2 (two) times daily. 12/02/15   Jodelle Gross, NP  metolazone (ZAROXOLYN) 5 MG tablet Take 1 tablet (5 mg total) by mouth daily. 12/06/15   Richarda Overlie, MD  metoprolol succinate (TOPROL-XL) 25 MG 24 hr tablet Take 3 tablets (75 mg total) by mouth 2 (two) times daily. Take with or immediately following a meal. 11/21/15   Erick Blinks, MD  potassium chloride SA (K-DUR,KLOR-CON) 20 MEQ tablet Take 20 mEq by mouth 2 (two) times daily.    Historical Provider, MD  rivaroxaban (XARELTO) 20 MG TABS tablet Take 1 tablet (20 mg total) by mouth every evening. 11/21/15   Erick Blinks, MD  sacubitril-valsartan (ENTRESTO) 24-26 MG Take 1 tablet by  mouth 2 (two) times daily. 11/27/15   Houston Siren, MD   BP 157/111 mmHg  Pulse 94  Temp(Src) 98.8 F (37.1 C) (Oral)  Resp 16  Ht 6' (1.829 m)  Wt 299 lb (135.626 kg)  BMI 40.54 kg/m2  SpO2 97%  Vital signs normal except hypertension  Physical Exam  Constitutional: He is oriented to person, place, and time. He appears well-developed and well-nourished.  Non-toxic appearance. He does not appear ill. No distress.  HENT:  Head: Normocephalic and atraumatic.  Right Ear: External ear normal.  Left Ear: External ear normal.  Nose: Nose normal. No mucosal edema or rhinorrhea.  Mouth/Throat: Oropharynx is clear and moist and mucous membranes are normal. No dental abscesses or uvula swelling.  Eyes: Conjunctivae and EOM are normal. Pupils are equal, round, and reactive to light.  Neck: Normal range of motion and full passive range of motion without pain. Neck supple.  Cardiovascular: Normal rate, regular rhythm and normal heart sounds.  Exam reveals no gallop and no friction rub.   No murmur heard. Pulmonary/Chest: Effort normal. No respiratory distress. He has no wheezes. He has no rhonchi. He has no rales. He exhibits no tenderness and no crepitus.  Breath sounds very diminished   Abdominal: Soft. Normal appearance and bowel sounds are normal. He exhibits no distension. There is no tenderness. There is no rebound and no guarding.  Musculoskeletal: Normal range of motion. He exhibits edema. He exhibits no tenderness.  Moves all extremities well.  Pitting edema up to the knees bilaterally.  Neurological: He is alert and oriented to person, place, and time. He has normal strength. No cranial nerve deficit.  Skin: Skin is warm, dry and intact. No rash noted. No erythema. No pallor.  Psychiatric: He has a normal mood and affect. His speech is normal and behavior is normal. His mood appears not anxious.  Nursing note and vitals reviewed.   ED Course  Procedures (including critical care  time)  Medications  furosemide (LASIX) injection 80 mg (80 mg Intravenous Given 12/15/15 0055)  nitroGLYCERIN (NITROGLYN) 2 % ointment 1 inch (1 inch Topical Given 12/15/15 0056)  albuterol (PROVENTIL) (2.5 MG/3ML) 0.083% nebulizer solution 5 mg (5 mg Nebulization Given 12/15/15 0047)  ipratropium (ATROVENT) nebulizer solution 0.5 mg (0.5 mg Nebulization Given 12/15/15 0047)    DIAGNOSTIC STUDIES: Oxygen Saturation is 97% on RA,  normal by my interpretation.    COORDINATION OF CARE: 12:09 AM Discussed treatment plan which includes lab work, CXR, EKG, NTG, Lasix, and a breathing treatment with pt at bedside and pt agreed to plan.  Recheck at 1:45 AM patient is urinating. He states he's starting to feel better.  Recheck it to 40 a.m. patient has a full urinal, he states this is the second time he has  filled up. He feels ready to be discharged. He states his breathing is much improved.  Patient presents with mild exacerbation of his congestive heart failure and peripheral edema. I suspect the patient is noncompliant with his diet and possibly his medication regimen. He is starting to come to the emergency room on a fairly frequent basis approximately once a week. He was given another information sheet on low sodium diet and information about congestive heart failure.  Labs Review Results for orders placed or performed during the hospital encounter of 12/14/15  Basic metabolic panel  Result Value Ref Range   Sodium 140 135 - 145 mmol/L   Potassium 3.7 3.5 - 5.1 mmol/L   Chloride 109 101 - 111 mmol/L   CO2 25 22 - 32 mmol/L   Glucose, Bld 99 65 - 99 mg/dL   BUN 24 (H) 6 - 20 mg/dL   Creatinine, Ser 8.11 (H) 0.61 - 1.24 mg/dL   Calcium 8.5 (L) 8.9 - 10.3 mg/dL   GFR calc non Af Amer >60 >60 mL/min   GFR calc Af Amer >60 >60 mL/min   Anion gap 6 5 - 15  Brain natriuretic peptide  Result Value Ref Range   B Natriuretic Peptide 1012.0 (H) 0.0 - 100.0 pg/mL  CBC with Differential  Result  Value Ref Range   WBC 3.8 (L) 4.0 - 10.5 K/uL   RBC 4.04 (L) 4.22 - 5.81 MIL/uL   Hemoglobin 11.0 (L) 13.0 - 17.0 g/dL   HCT 91.4 (L) 78.2 - 95.6 %   MCV 84.7 78.0 - 100.0 fL   MCH 27.2 26.0 - 34.0 pg   MCHC 32.2 30.0 - 36.0 g/dL   RDW 21.3 08.6 - 57.8 %   Platelets 177 150 - 400 K/uL   Neutrophils Relative % 57 %   Neutro Abs 2.2 1.7 - 7.7 K/uL   Lymphocytes Relative 29 %   Lymphs Abs 1.1 0.7 - 4.0 K/uL   Monocytes Relative 10 %   Monocytes Absolute 0.4 0.1 - 1.0 K/uL   Eosinophils Relative 3 %   Eosinophils Absolute 0.1 0.0 - 0.7 K/uL   Basophils Relative 1 %   Basophils Absolute 0.0 0.0 - 0.1 K/uL    Laboratory interpretation all normal except Mild increase in his BMP, mild anemia but stable, stable renal insufficiency   Imaging Review Dg Chest 2 View  12/15/2015  CLINICAL DATA:  Acute onset of tachycardia and palpitations. Mid to left-sided chest tightness and productive cough. Bilateral leg swelling and shortness of breath. Initial encounter. EXAM: CHEST  2 VIEW COMPARISON:  Chest radiograph from 12/02/2015 FINDINGS: The lungs are well-aerated. Vascular congestion is noted. Increased interstitial markings raise concern for mild pulmonary edema. There is no evidence of pleural effusion or pneumothorax. The heart is borderline enlarged. No acute osseous abnormalities are seen. IMPRESSION: Vascular congestion and borderline cardiomegaly. Increased interstitial markings raise concern for mild pulmonary edema. Electronically Signed   By: Roanna Raider M.D.   On: 12/15/2015 01:45   I have personally reviewed and evaluated these images and lab results as part of my medical decision-making.   EKG Interpretation None      ED ECG REPORT   Date: 12/15/2015  Rate: 92  Rhythm: atrial fibrillation   QRS Axis: right  Intervals: QT prolonged  ST/T Wave abnormalities: normal  Conduction Disutrbances:none  Narrative Interpretation:   Old EKG Reviewed: none available  I have  personally reviewed the EKG tracing and agree with the computerized printout as  noted.   MDM   Final diagnoses:  Peripheral edema  Acute on chronic diastolic CHF (congestive heart failure) (HCC)   Plan discharge  Devoria Albe, MD, FACEP   I personally performed the services described in this documentation, which was scribed in my presence. The recorded information has been reviewed and considered.  Devoria Albe, MD, Concha Pyo, MD 12/15/15 [phone]

## 2015-12-14 NOTE — ED Notes (Signed)
Having leg swelling and shortness of breath per pt. Started a couple of days ago.

## 2015-12-15 ENCOUNTER — Emergency Department (HOSPITAL_COMMUNITY): Payer: Medicare Other

## 2015-12-15 ENCOUNTER — Other Ambulatory Visit: Payer: Self-pay

## 2015-12-15 ENCOUNTER — Emergency Department (HOSPITAL_COMMUNITY)
Admission: EM | Admit: 2015-12-15 | Discharge: 2015-12-16 | Disposition: A | Discharge status: Home / Self Care | Payer: Medicare Other | Source: Home / Self Care | Attending: Emergency Medicine | Admitting: Emergency Medicine

## 2015-12-15 ENCOUNTER — Encounter (HOSPITAL_COMMUNITY): Payer: Self-pay | Admitting: *Deleted

## 2015-12-15 DIAGNOSIS — I5043 Acute on chronic combined systolic (congestive) and diastolic (congestive) heart failure: Secondary | ICD-10-CM | POA: Insufficient documentation

## 2015-12-15 DIAGNOSIS — J45909 Unspecified asthma, uncomplicated: Secondary | ICD-10-CM | POA: Insufficient documentation

## 2015-12-15 DIAGNOSIS — D509 Iron deficiency anemia, unspecified: Secondary | ICD-10-CM

## 2015-12-15 DIAGNOSIS — Z79899 Other long term (current) drug therapy: Secondary | ICD-10-CM | POA: Insufficient documentation

## 2015-12-15 DIAGNOSIS — E876 Hypokalemia: Secondary | ICD-10-CM | POA: Insufficient documentation

## 2015-12-15 DIAGNOSIS — D649 Anemia, unspecified: Secondary | ICD-10-CM

## 2015-12-15 DIAGNOSIS — I4891 Unspecified atrial fibrillation: Secondary | ICD-10-CM | POA: Insufficient documentation

## 2015-12-15 DIAGNOSIS — I110 Hypertensive heart disease with heart failure: Secondary | ICD-10-CM | POA: Insufficient documentation

## 2015-12-15 DIAGNOSIS — Z87891 Personal history of nicotine dependence: Secondary | ICD-10-CM | POA: Insufficient documentation

## 2015-12-15 DIAGNOSIS — D72819 Decreased white blood cell count, unspecified: Secondary | ICD-10-CM

## 2015-12-15 LAB — BASIC METABOLIC PANEL
Anion gap: 6 (ref 5–15)
Anion gap: 8 (ref 5–15)
BUN: 23 mg/dL — AB (ref 6–20)
BUN: 24 mg/dL — AB (ref 6–20)
CHLORIDE: 107 mmol/L (ref 101–111)
CHLORIDE: 109 mmol/L (ref 101–111)
CO2: 25 mmol/L (ref 22–32)
CO2: 26 mmol/L (ref 22–32)
CREATININE: 1.2 mg/dL (ref 0.61–1.24)
CREATININE: 1.28 mg/dL — AB (ref 0.61–1.24)
Calcium: 8.4 mg/dL — ABNORMAL LOW (ref 8.9–10.3)
Calcium: 8.5 mg/dL — ABNORMAL LOW (ref 8.9–10.3)
GFR calc Af Amer: >60 mL/min (ref >60)
GFR calc Af Amer: >60 mL/min (ref >60)
GFR calc non Af Amer: >60 mL/min (ref >60)
GFR calc non Af Amer: >60 mL/min (ref >60)
GLUCOSE: 99 mg/dL (ref 65–99)
Glucose, Bld: 94 mg/dL (ref 65–99)
Potassium: 3 mmol/L — ABNORMAL LOW (ref 3.5–5.1)
Potassium: 3.7 mmol/L (ref 3.5–5.1)
Sodium: 140 mmol/L (ref 135–145)
Sodium: 141 mmol/L (ref 135–145)

## 2015-12-15 LAB — CBC WITH DIFFERENTIAL/PLATELET
Basophils Absolute: 0 10*3/uL (ref 0.0–0.1)
Basophils Relative: 1 %
EOS ABS: 0.1 10*3/uL (ref 0.0–0.7)
Eosinophils Relative: 3 %
HEMATOCRIT: 34.2 % — AB (ref 39.0–52.0)
HEMOGLOBIN: 11 g/dL — AB (ref 13.0–17.0)
LYMPHS ABS: 1.1 10*3/uL (ref 0.7–4.0)
LYMPHS PCT: 29 %
MCH: 27.2 pg (ref 26.0–34.0)
MCHC: 32.2 g/dL (ref 30.0–36.0)
MCV: 84.7 fL (ref 78.0–100.0)
MONO ABS: 0.4 10*3/uL (ref 0.1–1.0)
MONOS PCT: 10 %
Neutro Abs: 2.2 10*3/uL (ref 1.7–7.7)
Neutrophils Relative %: 57 %
Platelets: 177 10*3/uL (ref 150–400)
RBC: 4.04 MIL/uL — ABNORMAL LOW (ref 4.22–5.81)
RDW: 15.3 % (ref 11.5–15.5)
WBC: 3.8 10*3/uL — AB (ref 4.0–10.5)

## 2015-12-15 LAB — BRAIN NATRIURETIC PEPTIDE
B Natriuretic Peptide: 1012 pg/mL — ABNORMAL HIGH (ref 0.0–100.0)
B Natriuretic Peptide: 757 pg/mL — ABNORMAL HIGH (ref 0.0–100.0)

## 2015-12-15 LAB — I-STAT TROPONIN, ED: Troponin i, poc: 0 ng/mL (ref 0.00–0.08)

## 2015-12-15 LAB — CBC
HCT: 33.7 % — ABNORMAL LOW (ref 39.0–52.0)
Hemoglobin: 10.8 g/dL — ABNORMAL LOW (ref 13.0–17.0)
MCH: 27.1 pg (ref 26.0–34.0)
MCHC: 32 g/dL (ref 30.0–36.0)
MCV: 84.7 fL (ref 78.0–100.0)
PLATELETS: 175 10*3/uL (ref 150–400)
RBC: 3.98 MIL/uL — ABNORMAL LOW (ref 4.22–5.81)
RDW: 14.9 % (ref 11.5–15.5)
WBC: 3.5 10*3/uL — ABNORMAL LOW (ref 4.0–10.5)

## 2015-12-15 MED ORDER — ALBUTEROL SULFATE (2.5 MG/3ML) 0.083% IN NEBU
5.0000 mg | INHALATION_SOLUTION | Freq: Once | RESPIRATORY_TRACT | Status: AC
  Administered 2015-12-15: 5 mg via RESPIRATORY_TRACT
  Filled 2015-12-15: qty 6

## 2015-12-15 MED ORDER — NITROGLYCERIN 2 % TD OINT
1.0000 [in_us] | TOPICAL_OINTMENT | Freq: Once | TRANSDERMAL | Status: AC
  Administered 2015-12-15: 1 [in_us] via TOPICAL
  Filled 2015-12-15: qty 1

## 2015-12-15 MED ORDER — POTASSIUM CHLORIDE CRYS ER 20 MEQ PO TBCR
40.0000 meq | EXTENDED_RELEASE_TABLET | Freq: Once | ORAL | Status: AC
  Administered 2015-12-15: 40 meq via ORAL
  Filled 2015-12-15: qty 2

## 2015-12-15 MED ORDER — POTASSIUM CHLORIDE 10 MEQ/100ML IV SOLN
10.0000 meq | Freq: Once | INTRAVENOUS | Status: AC
  Administered 2015-12-15: 10 meq via INTRAVENOUS
  Filled 2015-12-15: qty 100

## 2015-12-15 MED ORDER — FUROSEMIDE 10 MG/ML IJ SOLN
80.0000 mg | Freq: Once | INTRAMUSCULAR | Status: AC
  Administered 2015-12-15: 80 mg via INTRAVENOUS
  Filled 2015-12-15: qty 8

## 2015-12-15 MED ORDER — IPRATROPIUM BROMIDE 0.02 % IN SOLN
0.5000 mg | Freq: Once | RESPIRATORY_TRACT | Status: AC
  Administered 2015-12-15: 0.5 mg via RESPIRATORY_TRACT
  Filled 2015-12-15: qty 2.5

## 2015-12-15 NOTE — ED Provider Notes (Signed)
CSN: [id number]     Arrival date & time 12/15/15  1953 History   First MD Initiated Contact with Patient 12/15/15 2256     Chief Complaint  Patient presents with  . Weakness     (Consider location/radiation/quality/duration/timing/severity/associated sxs/prior Treatment) Patient is a 50 y.o. male presenting with weakness. The history is provided by the patient.  Weakness  He has a known history of nonischemic cardiomyopathy and atrial fibrillation. He comes in today with persistent swelling in his legs and difficulty breathing today. He is a somewhat poor and evasive historian. He had been in the emergency department last night because of swelling in his legs and states that he got some furosemide then and the swelling is down but he still has some persistent swelling. He feels like he went into atrial fibrillation today and is concerned that he is not on a medicines to control it. He did not take his dose of furosemide today. He denies fever cough. There is no nausea or vomiting or diaphoresis. He is scheduled to see his cardiologist tomorrow.  Past Medical History  Diagnosis Date  . Nonischemic cardiomyopathy (HCC)     LVEF 20-25%  . History of cardiac catheterization     Normal coronaries December 2016  . Essential hypertension   . Gastroesophageal reflux disease   . Osteoarthritis   . Peptic ulcer disease   . OSA (obstructive sleep apnea)   . Allergic rhinitis   . Atrial fibrillation (HCC)   . Asthma   . Noncompliance     Major problem leading to declining health and recurrent hospitalization   Past Surgical History  Procedure Laterality Date  . Cardiac catheterization N/A 06/14/2015    Procedure: Right/Left Heart Cath and Coronary Angiography;  Surgeon: Runell Gess, MD;  Location: [facility] INVASIVE CV LAB;  Service: Cardiovascular;  Laterality: N/A;   Family History  Problem Relation Age of Onset  . Colon cancer Neg Hx   . Inflammatory bowel disease Neg Hx   . Liver  disease Neg Hx   . Stroke Father   . Heart attack Father   . Aneurysm Mother     Cerebral aneurysm  . Hypertension Sister    Social History  Substance Use Topics  . Smoking status: Former Smoker -- 0.50 packs/day for 20 years    Types: Cigarettes    Start date: 04/26/1988    Quit date: 06/23/2007  . Smokeless tobacco: Never Used     Comment: 1 ppd former smoker  . Alcohol Use: No     Comment: No etoh since 2009    Review of Systems  Neurological: Positive for weakness.  All other systems reviewed and are negative.     Allergies  Bee venom; Lipitor; Aspirin; Banana; Diltiazem; and Hydralazine  Home Medications   Prior to Admission medications   Medication Sig Start Date End Date Taking? Authorizing Provider  atorvastatin (LIPITOR) 80 MG tablet Take 80 mg by mouth daily.   Yes Historical Provider, MD  beclomethasone (QVAR) 80 MCG/ACT inhaler Inhale 2 puffs into the lungs daily as needed (shortness of breath). 11/21/15  Yes Erick Blinks, MD  furosemide (LASIX) 80 MG tablet Take 1 tablet (80 mg total) by mouth 2 (two) times daily. 12/02/15  Yes Jodelle Gross, NP  metoprolol succinate (TOPROL-XL) 25 MG 24 hr tablet Take 3 tablets (75 mg total) by mouth 2 (two) times daily. Take with or immediately following a meal. 11/21/15  Yes Erick Blinks, MD  rivaroxaban (XARELTO) 20  MG TABS tablet Take 1 tablet (20 mg total) by mouth every evening. 11/21/15  Yes Erick Blinks, MD  sacubitril-valsartan (ENTRESTO) 24-26 MG Take 1 tablet by mouth 2 (two) times daily. 11/27/15  Yes Houston Siren, MD  metolazone (ZAROXOLYN) 5 MG tablet Take 1 tablet (5 mg total) by mouth daily. Patient not taking: Reported on 12/15/2015 12/06/15   Richarda Overlie, MD   BP 151/98 mmHg  Pulse 96  Resp 17  SpO2 99% Physical Exam  Nursing note and vitals reviewed.  Morbidly obese 51 year old male, resting comfortably and in no acute distress. Vital signs are significant for hypertension. Oxygen saturation is 99%, which  is normal. Head is normocephalic and atraumatic. PERRLA, EOMI. Oropharynx is clear. Neck is nontender and supple without adenopathy or JVD. Back is nontender and there is no CVA tenderness. Lungs have distant breath sounds without  rales, wheezes, or rhonchi. Chest is nontender. Heart has an irregular rhythm without murmur. Abdomen is soft, flat, nontender without masses or hepatosplenomegaly and peristalsis is normoactive. Extremities have 3+ edema, full range of motion is present. Skin is warm and dry without rash. Neurologic: Mental status is normal, cranial nerves are intact, there are no motor or sensory deficits.  ED Course  Procedures (including critical care time) Labs Review Results for orders placed or performed during the hospital encounter of 12/15/15  Basic metabolic panel  Result Value Ref Range   Sodium 141 135 - 145 mmol/L   Potassium 3.0 (L) 3.5 - 5.1 mmol/L   Chloride 107 101 - 111 mmol/L   CO2 26 22 - 32 mmol/L   Glucose, Bld 94 65 - 99 mg/dL   BUN 23 (H) 6 - 20 mg/dL   Creatinine, Ser 3.87 0.61 - 1.24 mg/dL   Calcium 8.4 (L) 8.9 - 10.3 mg/dL   GFR calc non Af Amer >60 >60 mL/min   GFR calc Af Amer >60 >60 mL/min   Anion gap 8 5 - 15  CBC  Result Value Ref Range   WBC 3.5 (L) 4.0 - 10.5 K/uL   RBC 3.98 (L) 4.22 - 5.81 MIL/uL   Hemoglobin 10.8 (L) 13.0 - 17.0 g/dL   HCT 56.4 (L) 33.2 - 95.1 %   MCV 84.7 78.0 - 100.0 fL   MCH 27.1 26.0 - 34.0 pg   MCHC 32.0 30.0 - 36.0 g/dL   RDW 88.4 16.6 - 06.3 %   Platelets 175 150 - 400 K/uL  Brain natriuretic peptide  Result Value Ref Range   B Natriuretic Peptide 757.0 (H) 0.0 - 100.0 pg/mL  I-stat troponin, ED  Result Value Ref Range   Troponin i, poc 0.00 0.00 - 0.08 ng/mL   Comment 3            Imaging Review Dg Chest 2 View  12/15/2015  CLINICAL DATA:  Acute onset of tachycardia and palpitations. Mid to left-sided chest tightness and productive cough. Bilateral leg swelling and shortness of breath. Initial  encounter. EXAM: CHEST  2 VIEW COMPARISON:  Chest radiograph from 12/02/2015 FINDINGS: The lungs are well-aerated. Vascular congestion is noted. Increased interstitial markings raise concern for mild pulmonary edema. There is no evidence of pleural effusion or pneumothorax. The heart is borderline enlarged. No acute osseous abnormalities are seen. IMPRESSION: Vascular congestion and borderline cardiomegaly. Increased interstitial markings raise concern for mild pulmonary edema. Electronically Signed   By: Roanna Raider M.D.   On: 12/15/2015 01:45   Dg Chest Portable 1 View  12/15/2015  CLINICAL DATA:  Shortness of breath.  Chest pain. EXAM: PORTABLE CHEST 1 VIEW COMPARISON:  December 15, 2015 FINDINGS: Cardiomegaly and pulmonary venous congestion with no overt edema, nodule, or mass. The hila and mediastinum are unchanged. IMPRESSION: Cardiomegaly and pulmonary venous congestion. Electronically Signed   By: Gerome Sam III M.D   On: 12/15/2015 20:43   I have personally reviewed and evaluated these images and lab results as part of my medical decision-making.   EKG Interpretation Vent. rate 89 BPM PR interval * ms QRS duration 96 ms QT/QTc 403/491 ms P-R-T axes * 64 70 Atrial fibrillation Borderline T wave abnormalities Borderline prolonged QT interval When compared with ECG of EARLIER SAME DATE Premature ventricular complexes are now Present Confirmed by [facility] MD, Alicha Raspberry ([id number]) on 12/15/2015 11:10:17 PM      MDM   Final diagnoses:  Acute on chronic combined systolic (congestive) and diastolic (congestive) heart failure (HCC)  Hypokalemia  Normochromic normocytic anemia  Leukopenia    CHF exacerbation. Old records reviewed and he was seen in the ED yesterday and given a dose of IV furosemide. He has multiple hospital admissions for CHF exacerbation. Currently, he is resting comfortably and seems to be maintaining adequate oxygen saturations. He is in atrial fibrillation but with  controlled rate. Potassium slowing is given both oral and intravenous potassium. He is given an additional dose of furosemide. He'll be observed and I'm hopeful that he'll be able to get him home to follow-up with his cardiologist tomorrow.  He had good diuresis with IV furosemide. Laboratory workup showed stable anemia and stable leukopenia and BNP at about his baseline. Following diuresis, he was ambulated in the ED and oxygen saturations never dropped below 92%. He has felt to be stable for discharge. He has an appointment with his cardiologist today which she is to keep any is sent home with prescription for K-dur  [name], MD 12/16/15 0127

## 2015-12-15 NOTE — ED Notes (Signed)
Pt refused wheelchair when he was discharged. Pt walked out to the waiting room on his own.

## 2015-12-15 NOTE — Discharge Instructions (Signed)
Try to watch your salt intake. Continue your medications. Recheck as needed.  Heart Failure Heart failure means your heart has trouble pumping blood. This makes it hard for your body to work well. Heart failure is usually a long-term (chronic) condition. You must take good care of yourself and follow your doctor's treatment plan. HOME CARE  Take your heart medicine as told by your doctor.  Do not stop taking medicine unless your doctor tells you to.  Do not skip any dose of medicine.  Refill your medicines before they run out.  Take other medicines only as told by your doctor or pharmacist.  Stay active if told by your doctor. The elderly and people with severe heart failure should talk with a doctor about physical activity.  Eat heart-healthy foods. Choose foods that are without trans fat and are low in saturated fat, cholesterol, and salt (sodium). This includes fresh or frozen fruits and vegetables, fish, lean meats, fat-free or low-fat dairy foods, whole grains, and high-fiber foods. Lentils and dried peas and beans (legumes) are also good choices.  Limit salt if told by your doctor.  Cook in a healthy way. Roast, grill, broil, bake, poach, steam, or stir-fry foods.  Limit fluids as told by your doctor.  Weigh yourself every morning. Do this after you pee (urinate) and before you eat breakfast. Write down your weight to give to your doctor.  Take your blood pressure and write it down if your doctor tells you to.  Ask your doctor how to check your pulse. Check your pulse as told.  Lose weight if told by your doctor.  Stop smoking or chewing tobacco. Do not use gum or patches that help you quit without your doctor's approval.  Schedule and go to doctor visits as told.  Nonpregnant women should have no more than 1 drink a day. Men should have no more than 2 drinks a day. Talk to your doctor about drinking alcohol.  Stop illegal drug use.  Stay current with shots  (immunizations).  Manage your health conditions as told by your doctor.  Learn to manage your stress.  Rest when you are tired.  If it is really hot outside:  Avoid intense activities.  Use air conditioning or fans, or get in a cooler place.  Avoid caffeine and alcohol.  Wear loose-fitting, lightweight, and light-colored clothing.  If it is really cold outside:  Avoid intense activities.  Layer your clothing.  Wear mittens or gloves, a hat, and a scarf when going outside.  Avoid alcohol.  Learn about heart failure and get support as needed.  Get help to maintain or improve your quality of life and your ability to care for yourself as needed. GET HELP IF:   You gain weight quickly.  You are more short of breath than usual.  You cannot do your normal activities.  You tire easily.  You cough more than normal, especially with activity.  You have any or more puffiness (swelling) in areas such as your hands, feet, ankles, or belly (abdomen).  You cannot sleep because it is hard to breathe.  You feel like your heart is beating fast (palpitations).  You get dizzy or light-headed when you stand up. GET HELP RIGHT AWAY IF:   You have trouble breathing.  There is a change in mental status, such as becoming less alert or not being able to focus.  You have chest pain or discomfort.  You faint. MAKE SURE YOU:   Understand these instructions.  Will watch your condition.  Will get help right away if you are not doing well or get worse.   This information is not intended to replace advice given to you by your health care provider. Make sure you discuss any questions you have with your health care provider.   Document Released: 03/17/2008 Document Revised: 06/29/2014 Document Reviewed: 07/25/2012 Elsevier Interactive Patient Education [employer]

## 2015-12-15 NOTE — ED Notes (Signed)
Pt brought in by [facility] for c/o weakness and chest pain; pt was here yesterday for same complaint; ems reports pt was unable to walk to stretcher and acts more lethargic than normal

## 2015-12-15 NOTE — ED Notes (Signed)
Pt's family member out at nurses desk stating pt needs O2;  Pt's O2 sat at 93%, O2 via [place] placed on pt

## 2015-12-16 ENCOUNTER — Ambulatory Visit (INDEPENDENT_AMBULATORY_CARE_PROVIDER_SITE_OTHER): Payer: Medicare Other | Admitting: Cardiovascular Disease

## 2015-12-16 ENCOUNTER — Encounter: Payer: Self-pay | Admitting: Cardiovascular Disease

## 2015-12-16 VITALS — BP 160/95 | HR 96 | Ht 72.0 in | Wt 294.0 lb

## 2015-12-16 DIAGNOSIS — I482 Chronic atrial fibrillation, unspecified: Secondary | ICD-10-CM

## 2015-12-16 DIAGNOSIS — Z87898 Personal history of other specified conditions: Secondary | ICD-10-CM

## 2015-12-16 DIAGNOSIS — Z9289 Personal history of other medical treatment: Secondary | ICD-10-CM

## 2015-12-16 DIAGNOSIS — Z9111 Patient's noncompliance with dietary regimen: Secondary | ICD-10-CM | POA: Diagnosis not present

## 2015-12-16 DIAGNOSIS — I5042 Chronic combined systolic (congestive) and diastolic (congestive) heart failure: Secondary | ICD-10-CM | POA: Diagnosis not present

## 2015-12-16 DIAGNOSIS — I519 Heart disease, unspecified: Secondary | ICD-10-CM

## 2015-12-16 DIAGNOSIS — Z91119 Patient's noncompliance with dietary regimen due to unspecified reason: Secondary | ICD-10-CM

## 2015-12-16 DIAGNOSIS — Z9114 Patient's other noncompliance with medication regimen: Secondary | ICD-10-CM

## 2015-12-16 MED ORDER — METOPROLOL SUCCINATE ER 100 MG PO TB24: 100.0000 mg | ORAL_TABLET | Freq: Two times a day (BID) | ORAL | Status: DC

## 2015-12-16 MED ORDER — POTASSIUM CHLORIDE CRYS ER 20 MEQ PO TBCR: 20.0000 meq | EXTENDED_RELEASE_TABLET | Freq: Two times a day (BID) | ORAL | Status: DC

## 2015-12-16 NOTE — Discharge Instructions (Signed)
Heart Failure °Heart failure is a condition in which the heart has trouble pumping blood. This means your heart does not pump blood efficiently for your body to work well. In some cases of heart failure, fluid may back up into your lungs or you may have swelling (edema) in your lower legs. Heart failure is usually a long-term (chronic) condition. It is important for you to take good care of yourself and follow your health care provider's treatment plan. °CAUSES  °Some health conditions can cause heart failure. Those health conditions include: °· High blood pressure (hypertension). Hypertension causes the heart muscle to work harder than normal. When pressure in the blood vessels is high, the heart needs to pump (contract) with more force in order to circulate blood throughout the body. High blood pressure eventually causes the heart to become stiff and weak. °· Coronary artery disease (CAD). CAD is the buildup of cholesterol and fat (plaque) in the arteries of the heart. The blockage in the arteries deprives the heart muscle of oxygen and blood. This can cause chest pain and may lead to a heart attack. High blood pressure can also contribute to CAD. °· Heart attack (myocardial infarction). A heart attack occurs when one or more arteries in the heart become blocked. The loss of oxygen damages the muscle tissue of the heart. When this happens, part of the heart muscle dies. The injured tissue does not contract as well and weakens the heart's ability to pump blood. °· Abnormal heart valves. When the heart valves do not open and close properly, it can cause heart failure. This makes the heart muscle pump harder to keep the blood flowing. °· Heart muscle disease (cardiomyopathy or myocarditis). Heart muscle disease is damage to the heart muscle from a variety of causes. These can include drug or alcohol abuse, infections, or unknown reasons. These can increase the risk of heart failure. °· Lung disease. Lung disease  makes the heart work harder because the lungs do not work properly. This can cause a strain on the heart, leading it to fail. °· Diabetes. Diabetes increases the risk of heart failure. High blood sugar contributes to high fat (lipid) levels in the blood. Diabetes can also cause slow damage to tiny blood vessels that carry important nutrients to the heart muscle. When the heart does not get enough oxygen and food, it can cause the heart to become weak and stiff. This leads to a heart that does not contract efficiently. °· Other conditions can contribute to heart failure. These include abnormal heart rhythms, thyroid problems, and low blood counts (anemia). °Certain unhealthy behaviors can increase the risk of heart failure, including: °· Being overweight. °· Smoking or chewing tobacco. °· Eating foods high in fat and cholesterol. °· Abusing illicit drugs or alcohol. °· Lacking physical activity. °SYMPTOMS  °Heart failure symptoms may vary and can be hard to detect. Symptoms may include: °· Shortness of breath with activity, such as climbing stairs. °· Persistent cough. °· Swelling of the feet, ankles, legs, or abdomen. °· Unexplained weight gain. °· Difficulty breathing when lying flat (orthopnea). °· Waking from sleep because of the need to sit up and get more air. °· Rapid heartbeat. °· Fatigue and loss of energy. °· Feeling light-headed, dizzy, or close to fainting. °· Loss of appetite. °· Nausea. °· Increased urination during the night (nocturia). °DIAGNOSIS  °A diagnosis of heart failure is based on your history, symptoms, physical examination, and diagnostic tests. Diagnostic tests for heart failure may include: °·   Echocardiography. °· Electrocardiography. °· Chest X-ray. °· Blood tests. °· Exercise stress test. °· Cardiac angiography. °· Radionuclide scans. °TREATMENT  °Treatment is aimed at managing the symptoms of heart failure. Medicines, behavioral changes, or surgical intervention may be necessary to  treat heart failure. °· Medicines to help treat heart failure may include: °¨ Angiotensin-converting enzyme (ACE) inhibitors. This type of medicine blocks the effects of a blood protein called angiotensin-converting enzyme. ACE inhibitors relax (dilate) the blood vessels and help lower blood pressure. °¨ Angiotensin receptor blockers (ARBs). This type of medicine blocks the actions of a blood protein called angiotensin. Angiotensin receptor blockers dilate the blood vessels and help lower blood pressure. °¨ Water pills (diuretics). Diuretics cause the kidneys to remove salt and water from the blood. The extra fluid is removed through urination. This loss of extra fluid lowers the volume of blood the heart pumps. °¨ Beta blockers. These prevent the heart from beating too fast and improve heart muscle strength. °¨ Digitalis. This increases the force of the heartbeat. °· Healthy behavior changes include: °¨ Obtaining and maintaining a healthy weight. °¨ Stopping smoking or chewing tobacco. °¨ Eating heart-healthy foods. °¨ Limiting or avoiding alcohol. °¨ Stopping illicit drug use. °¨ Physical activity as directed by your health care provider. °· Surgical treatment for heart failure may include: °¨ A procedure to open blocked arteries, repair damaged heart valves, or remove damaged heart muscle tissue. °¨ A pacemaker to improve heart muscle function and control certain abnormal heart rhythms. °¨ An internal cardioverter defibrillator to treat certain serious abnormal heart rhythms. °¨ A left ventricular assist device (LVAD) to assist the pumping ability of the heart. °HOME CARE INSTRUCTIONS  °· Take medicines only as directed by your health care provider. Medicines are important in reducing the workload of your heart, slowing the progression of heart failure, and improving your symptoms. °¨ Do not stop taking your medicine unless directed by your health care provider. °¨ Do not skip any dose of medicine. °¨ Refill your  prescriptions before you run out of medicine. Your medicines are needed every day. °· Engage in moderate physical activity if directed by your health care provider. Moderate physical activity can benefit some people. The elderly and people with severe heart failure should consult with a health care provider for physical activity recommendations. °· Eat heart-healthy foods. Food choices should be free of trans fat and low in saturated fat, cholesterol, and salt (sodium). Healthy choices include fresh or frozen fruits and vegetables, fish, lean meats, legumes, fat-free or low-fat dairy products, and whole grain or high fiber foods. Talk to a dietitian to learn more about heart-healthy foods. °· Limit sodium if directed by your health care provider. Sodium restriction may reduce symptoms of heart failure in some people. Talk to a dietitian to learn more about heart-healthy seasonings. °· Use healthy cooking methods. Healthy cooking methods include roasting, grilling, broiling, baking, poaching, steaming, or stir-frying. Talk to a dietitian to learn more about healthy cooking methods. °· Limit fluids if directed by your health care provider. Fluid restriction may reduce symptoms of heart failure in some people. °· Weigh yourself every day. Daily weights are important in the early recognition of excess fluid. You should weigh yourself every morning after you urinate and before you eat breakfast. Wear the same amount of clothing each time you weigh yourself. Record your daily weight. Provide your health care provider with your weight record. °· Monitor and record your blood pressure if directed by your health care   provider.  Check your pulse if directed by your health care provider.  Lose weight if directed by your health care provider. Weight loss may reduce symptoms of heart failure in some people.  Stop smoking or chewing tobacco. Nicotine makes your heart work harder by causing your blood vessels to constrict.  Do not use nicotine gum or patches before talking to your health care provider.  Keep all follow-up visits as directed by your health care provider. This is important.  Limit alcohol intake to no more than 1 drink per day for nonpregnant women and 2 drinks per day for men. One drink equals 12 ounces of beer, 5 ounces of wine, or 1 ounces of hard liquor. Drinking more than that is harmful to your heart. Tell your health care provider if you drink alcohol several times a week. Talk with your health care provider about whether alcohol is safe for you. If your heart has already been damaged by alcohol or you have severe heart failure, drinking alcohol should be stopped completely.  Stop illicit drug use.  Stay up-to-date with immunizations. It is especially important to prevent respiratory infections through current pneumococcal and influenza immunizations.  Manage other health conditions such as hypertension, diabetes, thyroid disease, or abnormal heart rhythms as directed by your health care provider.  Learn to manage stress.  Plan rest periods when fatigued.  Learn strategies to manage high temperatures. If the weather is extremely hot:  Avoid vigorous physical activity.  Use air conditioning or fans or seek a cooler location.  Avoid caffeine and alcohol.  Wear loose-fitting, lightweight, and light-colored clothing.  Learn strategies to manage cold temperatures. If the weather is extremely cold:  Avoid vigorous physical activity.  Layer clothes.  Wear mittens or gloves, a hat, and a scarf when going outside.  Avoid alcohol.  Obtain ongoing education and support as needed.  Participate in or seek rehabilitation as needed to maintain or improve independence and quality of life. SEEK MEDICAL CARE IF:   You have a rapid weight gain.  You have increasing shortness of breath that is unusual for you.  You are unable to participate in your usual physical activities.  You tire  easily.  You cough more than normal, especially with physical activity.  You have any or more swelling in areas such as your hands, feet, ankles, or abdomen.  You are unable to sleep because it is hard to breathe.  You feel like your heart is beating fast (palpitations).  You become dizzy or light-headed upon standing up. SEEK IMMEDIATE MEDICAL CARE IF:   You have difficulty breathing.  There is a change in mental status such as decreased alertness or difficulty with concentration.  You have a pain or discomfort in your chest.  You have an episode of fainting (syncope). MAKE SURE YOU:   Understand these instructions.  Will watch your condition.  Will get help right away if you are not doing well or get worse.   This information is not intended to replace advice given to you by your health care provider. Make sure you discuss any questions you have with your health care provider.   Document Released: 06/08/2005 Document Revised: 10/23/2014 Document Reviewed: 07/08/2012 Elsevier Interactive Patient Education 2016 [employer]  Potassium Salts tablets, extended-release tablets or capsules What is this medicine? POTASSIUM (poe TASS i um) is a natural salt that is important for the heart, muscles, and nerves. It is found in many foods and is normally supplied by a well  balanced diet. This medicine is used to treat low potassium. This medicine may be used for other purposes; ask your health care provider or pharmacist if you have questions. What should I tell my health care provider before I take this medicine? They need to know if you have any of these conditions: -Addison's disease -dehydration -diabetes -difficulty swallowing -heart disease -history of high levels of potassium in the blood -irregular heartbeat -kidney disease -recent severe burn -stomach ulcers or other stomach problems -an unusual or allergic reaction to potassium, tartrazine, other medicines, foods,  dyes, or preservatives -pregnant or trying to get pregnant -breast-feeding How should I use this medicine? Take this medicine by mouth with a full glass of water. Take with food. Follow the directions on the prescription label. Do not suck on, crush, or chew this medicine. If you have difficulty swallowing, ask the pharmacist how to take. Take your medicine at regular intervals. Do not take it more often than directed. Do not stop taking except on your doctor's advice. Talk to your pediatrician regarding the use of this medicine in children. Special care may be needed. Overdosage: If you think you have taken too much of this medicine contact a poison control center or emergency room at once. NOTE: This medicine is only for you. Do not share this medicine with others. What if I miss a dose? If you miss a dose, take it as soon as you can. If it is almost time for your next dose, take only that dose. Do not take double or extra doses. What may interact with this medicine? Do not take this medicine with any of the following medications: -eplerenone -certain medicines for stomach problems like atropine; difenoxin and glycopyrrolate -sodium polystyrene sulfonate This medicine may also interact with the following medications: -certain medicines for blood pressure or heart disease like lisinopril, losartan, quinapril, valsartan -medicines for cold or allergies -NSAIDs, medicines for pain and inflammation, like ibuprofen or napoxen -other potassium supplements -salt substitutes -some diuretics This list may not describe all possible interactions. Give your health care provider a list of all the medicines, herbs, non-prescription drugs, or dietary supplements you use. Also tell them if you smoke, drink alcohol, or use illegal drugs. Some items may interact with your medicine. What should I watch for while using this medicine? Visit your doctor or health care professional for regular check ups. You will  need lab work done regularly. You may need to be on a special diet while taking this medicine. Ask your doctor. What side effects may I notice from receiving this medicine? Side effects that you should report to your doctor or health care professional as soon as possible: -allergic reactions like skin rash, itching or hives, swelling of the face, lips, or tongue -anxious -black, tarry stools -breathing problems -confusion -heartburn -irregular heartbeat -numbness or tingling in hands or feet -pain when swallowing -unusually weak or tired -weakness, heaviness of legs Side effects that usually do not require medical attention (report to your doctor or health care professional if they continue or are bothersome): -diarrhea -nausea -upset stomach -vomiting This list may not describe all possible side effects. Call your doctor for medical advice about side effects. You may report side effects to FDA at 1-800-FDA-1088. Where should I keep my medicine? Keep out of the reach of children. Store at room temperature between 15 and 30 degrees C (59 and 86 degrees F ). Keep bottle closed tightly to protect this medicine from light and moisture. Throw away any  unused medicine after the expiration date. NOTE: This sheet is a summary. It may not cover all possible information. If you have questions about this medicine, talk to your doctor, pharmacist, or health care provider.    2016, Elsevier/Gold Standard. (2014-11-15 08:55:21)

## 2015-12-16 NOTE — Progress Notes (Signed)
Patient ID: [name], male   DOB: Feb 11, 1966, 50 y.o.   MRN: [id number]      SUBJECTIVE: The patient presents for post hospitalization follow-up. He is in the process of being dismissed from our practice due to treatment noncompliance. He has repeatedly been hospitalized for acute on chronic combined systolic and diastolic heart failure. He never fills his medications. LVEF 20-25% on 09/30/15. Cardiac catheterization in December 2016 revealed normal coronary arteries. He has persistent atrial fibrillation and it is unclear that he ever takes Xarelto.  Wt 294 lbs (274 lbs on 12/04/15).  Evaluated in the ED yesterday for shortness of breath and leg swelling. Chest x-ray showed vascular congestion/mild pulmonary edema. BNP 757.  He complains of leg swelling. He said he could not tolerate Entresto due to dizziness although blood pressure was 120/80 when he took it. Says he is taking Xarelto and Lasix. Says Lipitor gives him nosebleeds.  Review of Systems: As per "subjective", otherwise negative.  Allergies  Allergen Reactions  . Bee Venom Shortness Of Breath and Swelling  . Lipitor [Atorvastatin] Shortness Of Breath and Other (See Comments)    Nose bleed  . Aspirin Other (See Comments)    Chewable 81 mg tablets upsets patients stomach  . Banana Diarrhea  . Diltiazem Other (See Comments)    Makes heart beat fast  . Hydralazine Other (See Comments)    Patient states it causes Tachycardia    Current Outpatient Prescriptions  Medication Sig Dispense Refill  . atorvastatin (LIPITOR) 80 MG tablet Take 80 mg by mouth daily.    . beclomethasone (QVAR) 80 MCG/ACT inhaler Inhale 2 puffs into the lungs daily as needed (shortness of breath). 1 Inhaler 12  . furosemide (LASIX) 80 MG tablet Take 1 tablet (80 mg total) by mouth 2 (two) times daily. 180 tablet 1  . metoprolol succinate (TOPROL-XL) 25 MG 24 hr tablet Take 3 tablets (75 mg total) by mouth 2 (two) times daily. Take with or immediately  following a meal. 180 tablet 0  . potassium chloride SA (K-DUR,KLOR-CON) 20 MEQ tablet Take 1 tablet (20 mEq total) by mouth 2 (two) times daily. 20 tablet 0  . rivaroxaban (XARELTO) 20 MG TABS tablet Take 1 tablet (20 mg total) by mouth every evening. 30 tablet 2  . sacubitril-valsartan (ENTRESTO) 24-26 MG Take 1 tablet by mouth 2 (two) times daily. 60 tablet 1  . [DISCONTINUED] metolazone (ZAROXOLYN) 5 MG tablet Take 1 tablet (5 mg total) by mouth daily. (Patient not taking: Reported on 12/15/2015) 1 tablet 0  . [DISCONTINUED] potassium chloride 20 MEQ TBCR Take 20 mEq by mouth 2 (two) times daily. 10 tablet 0   No current facility-administered medications for this visit.    Past Medical History  Diagnosis Date  . Nonischemic cardiomyopathy (HCC)     LVEF 20-25%  . History of cardiac catheterization     Normal coronaries December 2016  . Essential hypertension   . Gastroesophageal reflux disease   . Osteoarthritis   . Peptic ulcer disease   . OSA (obstructive sleep apnea)   . Allergic rhinitis   . Atrial fibrillation (HCC)   . Asthma   . Noncompliance     Major problem leading to declining health and recurrent hospitalization    Past Surgical History  Procedure Laterality Date  . Cardiac catheterization N/A 06/14/2015    Procedure: Right/Left Heart Cath and Coronary Angiography;  Surgeon: Runell Gess, MD;  Location: [facility] INVASIVE CV LAB;  Service: Cardiovascular;  Laterality: N/A;    Social History   Social History  . Marital Status: Divorced    Spouse Name: N/A  . Number of Children: 3  . Years of Education: N/A   Occupational History  . disabled    Social History Main Topics  . Smoking status: Former Smoker -- 0.50 packs/day for 20 years    Types: Cigarettes    Start date: 04/26/1988    Quit date: 06/23/2007  . Smokeless tobacco: Never Used     Comment: 1 ppd former smoker  . Alcohol Use: No     Comment: No etoh since 2009  . Drug Use: No  . Sexual  Activity: No   Other Topics Concern  . Not on file   Social History Narrative     Filed Vitals:   12/16/15 1342  BP: 160/95  Pulse: 96  Height: 6' (1.829 m)  Weight: 294 lb (133.358 kg)  SpO2: 98%    PHYSICAL EXAM General: NAD, wearing sunglasses. HEENT: Normal. Neck: No JVD, no thyromegaly. Lungs: Clear to auscultation bilaterally. CV: Nondisplaced PMI.  Tachycardic, irregular rhythm, normal S1/S2, no S3, no murmur. 1+ pitting edema.   Abdomen: Obese.  Neurologic: Alert and oriented.  Psych: Normal affect. Skin: Normal. Musculoskeletal: No gross deformities.    ECG: Most recent ECG reviewed.      ASSESSMENT AND PLAN: 1. Chronic combined systolic and diastolic heart failure: Continue Lasix 80 mg bid and Entresto. Won't take metolazone.  2. Nonischemic cardiomyopathy, LVEF 20-25%: Normal coronary arteries in 05/2015.  3. Persistent atrial fibrillation: Prescribed Xarelto. Unclear that he takes it. Tachycardic. Will increase Toprol-XL to 100 mg bid.  4. Essential HTN: Markedly elevated. Noncompliant with Rx. Metoprolol being increased.  5. Medication/treatment noncompliance: Repeatedly educated regarding health hazards which did not change his habits.  Dispo: Pt in process of being discharged from practice due to aforementioned reasons (treatment noncompliance).   Prentice Docker, M.D., F.A.C.C.

## 2015-12-16 NOTE — ED Notes (Signed)
Pt called for his ride before leaving the treatment room. Pt carried to waiting room in wheelchair.

## 2015-12-16 NOTE — ED Notes (Signed)
Pt ambulated around department. O2 sats remain 92% or greater, HR increased to 142 BPM. Pt did have to stop once when walking around nurses station. Pt says feels close to normal.

## 2015-12-16 NOTE — Patient Instructions (Signed)
Medication Instructions:  Your physician has recommended you make the following change in your medication: Increase metoprolol succinate to 100 mg twice daily. You may take (4) of your 25 mg metoprolol succinate tablets twice daily until they are finished. A new prescription has been sent for the 100 mg tablets. Continue all other medications the same.  Labwork: NONE  Testing/Procedures: NONE  Follow-Up: Your physician recommends that you schedule a follow-up appointment in: pending.  Any Other Special Instructions Will Be Listed Below (If Applicable).  If you need a refill on your cardiac medications before your next appointment, please call your pharmacy.

## 2015-12-21 ENCOUNTER — Inpatient Hospital Stay (HOSPITAL_COMMUNITY)
Admission: EM | Admit: 2015-12-21 | Discharge: 2015-12-25 | DRG: 292 | Disposition: A | Discharge status: Home Health | Payer: Medicare Other | Attending: Internal Medicine | Admitting: Internal Medicine

## 2015-12-21 ENCOUNTER — Emergency Department (HOSPITAL_COMMUNITY): Payer: Medicare Other

## 2015-12-21 ENCOUNTER — Encounter (HOSPITAL_COMMUNITY): Payer: Self-pay | Admitting: Emergency Medicine

## 2015-12-21 DIAGNOSIS — J45909 Unspecified asthma, uncomplicated: Secondary | ICD-10-CM | POA: Diagnosis present

## 2015-12-21 DIAGNOSIS — R600 Localized edema: Secondary | ICD-10-CM | POA: Diagnosis not present

## 2015-12-21 DIAGNOSIS — J4530 Mild persistent asthma, uncomplicated: Secondary | ICD-10-CM

## 2015-12-21 DIAGNOSIS — I110 Hypertensive heart disease with heart failure: Principal | ICD-10-CM | POA: Diagnosis present

## 2015-12-21 DIAGNOSIS — Z91199 Patient's noncompliance with other medical treatment and regimen due to unspecified reason: Secondary | ICD-10-CM

## 2015-12-21 DIAGNOSIS — I482 Chronic atrial fibrillation, unspecified: Secondary | ICD-10-CM | POA: Diagnosis present

## 2015-12-21 DIAGNOSIS — I428 Other cardiomyopathies: Secondary | ICD-10-CM | POA: Diagnosis present

## 2015-12-21 DIAGNOSIS — I5043 Acute on chronic combined systolic (congestive) and diastolic (congestive) heart failure: Secondary | ICD-10-CM | POA: Diagnosis not present

## 2015-12-21 DIAGNOSIS — D649 Anemia, unspecified: Secondary | ICD-10-CM | POA: Diagnosis present

## 2015-12-21 DIAGNOSIS — R778 Other specified abnormalities of plasma proteins: Secondary | ICD-10-CM | POA: Diagnosis present

## 2015-12-21 DIAGNOSIS — R7989 Other specified abnormal findings of blood chemistry: Secondary | ICD-10-CM | POA: Diagnosis present

## 2015-12-21 DIAGNOSIS — Z9119 Patient's noncompliance with other medical treatment and regimen: Secondary | ICD-10-CM

## 2015-12-21 DIAGNOSIS — Z888 Allergy status to other drugs, medicaments and biological substances status: Secondary | ICD-10-CM

## 2015-12-21 DIAGNOSIS — Z91119 Patient's noncompliance with dietary regimen due to unspecified reason: Secondary | ICD-10-CM

## 2015-12-21 DIAGNOSIS — Z87891 Personal history of nicotine dependence: Secondary | ICD-10-CM

## 2015-12-21 DIAGNOSIS — I10 Essential (primary) hypertension: Secondary | ICD-10-CM | POA: Diagnosis present

## 2015-12-21 DIAGNOSIS — I5021 Acute systolic (congestive) heart failure: Secondary | ICD-10-CM | POA: Insufficient documentation

## 2015-12-21 DIAGNOSIS — Z9111 Patient's noncompliance with dietary regimen: Secondary | ICD-10-CM

## 2015-12-21 DIAGNOSIS — Z9114 Patient's other noncompliance with medication regimen: Secondary | ICD-10-CM

## 2015-12-21 DIAGNOSIS — Z886 Allergy status to analgesic agent status: Secondary | ICD-10-CM

## 2015-12-21 DIAGNOSIS — M1990 Unspecified osteoarthritis, unspecified site: Secondary | ICD-10-CM | POA: Diagnosis present

## 2015-12-21 DIAGNOSIS — G4733 Obstructive sleep apnea (adult) (pediatric): Secondary | ICD-10-CM | POA: Diagnosis present

## 2015-12-21 DIAGNOSIS — I472 Ventricular tachycardia: Secondary | ICD-10-CM | POA: Diagnosis not present

## 2015-12-21 DIAGNOSIS — Z7901 Long term (current) use of anticoagulants: Secondary | ICD-10-CM

## 2015-12-21 DIAGNOSIS — R748 Abnormal levels of other serum enzymes: Secondary | ICD-10-CM | POA: Diagnosis present

## 2015-12-21 LAB — CBC WITH DIFFERENTIAL/PLATELET
BASOS ABS: 0 10*3/uL (ref 0.0–0.1)
Basophils Relative: 0 %
EOS PCT: 3 %
Eosinophils Absolute: 0.1 10*3/uL (ref 0.0–0.7)
HCT: 35.4 % — ABNORMAL LOW (ref 39.0–52.0)
Hemoglobin: 11.3 g/dL — ABNORMAL LOW (ref 13.0–17.0)
LYMPHS PCT: 21 %
Lymphs Abs: 1 10*3/uL (ref 0.7–4.0)
MCH: 27.1 pg (ref 26.0–34.0)
MCHC: 31.9 g/dL (ref 30.0–36.0)
MCV: 84.9 fL (ref 78.0–100.0)
MONO ABS: 0.4 10*3/uL (ref 0.1–1.0)
MONOS PCT: 9 %
Neutro Abs: 3 10*3/uL (ref 1.7–7.7)
Neutrophils Relative %: 67 %
PLATELETS: 196 10*3/uL (ref 150–400)
RBC: 4.17 MIL/uL — ABNORMAL LOW (ref 4.22–5.81)
RDW: 15.2 % (ref 11.5–15.5)
WBC: 4.5 10*3/uL (ref 4.0–10.5)

## 2015-12-21 LAB — COMPREHENSIVE METABOLIC PANEL
ALT: 21 U/L (ref 17–63)
AST: 24 U/L (ref 15–41)
Albumin: 3.8 g/dL (ref 3.5–5.0)
Alkaline Phosphatase: 69 U/L (ref 38–126)
Anion gap: 5 (ref 5–15)
BUN: 21 mg/dL — ABNORMAL HIGH (ref 6–20)
CHLORIDE: 107 mmol/L (ref 101–111)
CO2: 27 mmol/L (ref 22–32)
Calcium: 8.7 mg/dL — ABNORMAL LOW (ref 8.9–10.3)
Creatinine, Ser: 1.02 mg/dL (ref 0.61–1.24)
Glucose, Bld: 90 mg/dL (ref 65–99)
POTASSIUM: 3.5 mmol/L (ref 3.5–5.1)
Sodium: 139 mmol/L (ref 135–145)
Total Bilirubin: 1.9 mg/dL — ABNORMAL HIGH (ref 0.3–1.2)
Total Protein: 6.9 g/dL (ref 6.5–8.1)

## 2015-12-21 LAB — BRAIN NATRIURETIC PEPTIDE: B NATRIURETIC PEPTIDE 5: 1046 pg/mL — AB (ref 0.0–100.0)

## 2015-12-21 LAB — MAGNESIUM: Magnesium: 2 mg/dL (ref 1.7–2.4)

## 2015-12-21 LAB — TROPONIN I
TROPONIN I: 0.04 ng/mL — AB (ref <0.03)
Troponin I: 0.03 ng/mL (ref <0.03)

## 2015-12-21 MED ORDER — FUROSEMIDE 10 MG/ML IJ SOLN
60.0000 mg | Freq: Two times a day (BID) | INTRAMUSCULAR | Status: DC
  Administered 2015-12-22: 60 mg via INTRAVENOUS
  Filled 2015-12-21: qty 6

## 2015-12-21 MED ORDER — ONDANSETRON HCL 4 MG/2ML IJ SOLN: 4.0000 mg | Freq: Four times a day (QID) | INTRAMUSCULAR | Status: DC | PRN

## 2015-12-21 MED ORDER — FUROSEMIDE 10 MG/ML IJ SOLN
80.0000 mg | Freq: Once | INTRAMUSCULAR | Status: AC
  Administered 2015-12-21: 80 mg via INTRAVENOUS
  Filled 2015-12-21: qty 8

## 2015-12-21 MED ORDER — RIVAROXABAN 20 MG PO TABS
20.0000 mg | ORAL_TABLET | Freq: Every evening | ORAL | Status: DC
  Administered 2015-12-21 – 2015-12-24 (×4): 20 mg via ORAL
  Filled 2015-12-21 (×8): qty 1

## 2015-12-21 MED ORDER — NITROGLYCERIN 2 % TD OINT
1.0000 [in_us] | TOPICAL_OINTMENT | Freq: Three times a day (TID) | TRANSDERMAL | Status: DC
  Administered 2015-12-21 – 2015-12-25 (×10): 1 [in_us] via TOPICAL
  Filled 2015-12-21 (×11): qty 1

## 2015-12-21 MED ORDER — LISINOPRIL 5 MG PO TABS
5.0000 mg | ORAL_TABLET | Freq: Every day | ORAL | Status: DC
  Administered 2015-12-21 – 2015-12-25 (×3): 5 mg via ORAL
  Filled 2015-12-21 (×4): qty 1

## 2015-12-21 MED ORDER — BECLOMETHASONE DIPROPIONATE 80 MCG/ACT IN AERS: 2.0000 | INHALATION_SPRAY | Freq: Two times a day (BID) | RESPIRATORY_TRACT | Status: DC

## 2015-12-21 MED ORDER — SODIUM CHLORIDE 0.9 % IV SOLN: 250.0000 mL | INTRAVENOUS | Status: DC | PRN

## 2015-12-21 MED ORDER — SODIUM CHLORIDE 0.9% FLUSH
3.0000 mL | Freq: Two times a day (BID) | INTRAVENOUS | Status: DC
  Administered 2015-12-21 – 2015-12-25 (×7): 3 mL via INTRAVENOUS

## 2015-12-21 MED ORDER — SODIUM CHLORIDE 0.9% FLUSH: 3.0000 mL | INTRAVENOUS | Status: DC | PRN

## 2015-12-21 MED ORDER — ACETAMINOPHEN 325 MG PO TABS: 650.0000 mg | ORAL_TABLET | ORAL | Status: DC | PRN

## 2015-12-21 MED ORDER — BECLOMETHASONE DIPROPIONATE 80 MCG/ACT IN AERS
2.0000 | INHALATION_SPRAY | Freq: Two times a day (BID) | RESPIRATORY_TRACT | Status: DC
  Filled 2015-12-21: qty 8.7

## 2015-12-21 MED ORDER — METOPROLOL SUCCINATE ER 50 MG PO TB24
100.0000 mg | ORAL_TABLET | Freq: Two times a day (BID) | ORAL | Status: DC
  Administered 2015-12-21 – 2015-12-25 (×8): 100 mg via ORAL
  Filled 2015-12-21: qty 1
  Filled 2015-12-21 (×2): qty 2
  Filled 2015-12-21: qty 1
  Filled 2015-12-21: qty 2
  Filled 2015-12-21: qty 1
  Filled 2015-12-21 (×5): qty 2
  Filled 2015-12-21: qty 1

## 2015-12-21 MED ORDER — POTASSIUM CHLORIDE CRYS ER 20 MEQ PO TBCR
20.0000 meq | EXTENDED_RELEASE_TABLET | Freq: Two times a day (BID) | ORAL | Status: DC
  Administered 2015-12-21 – 2015-12-25 (×8): 20 meq via ORAL
  Filled 2015-12-21 (×8): qty 1

## 2015-12-21 MED ORDER — ENOXAPARIN SODIUM 40 MG/0.4ML ~~LOC~~ SOLN: 40.0000 mg | SUBCUTANEOUS | Status: DC

## 2015-12-21 MED ORDER — FERROUS SULFATE 325 (65 FE) MG PO TABS
325.0000 mg | ORAL_TABLET | Freq: Two times a day (BID) | ORAL | Status: DC
  Administered 2015-12-21 – 2015-12-25 (×8): 325 mg via ORAL
  Filled 2015-12-21 (×12): qty 1

## 2015-12-21 NOTE — ED Notes (Signed)
CRITICAL VALUE ALERT  Critical value received:  Troponin 0.04 Date of notification:  12/21/15  Time of notification:  1320  Critical value read back:Yes.    Nurse who received alert:  [id number]  MD notified (1st page):  Rubin Payor  Time of first page:  1320  MD notified (2nd page):  Time of second page:  Responding MD:  Rubin Payor  Time MD responded:  1320

## 2015-12-21 NOTE — ED Notes (Signed)
Pt states he has been having leg swelling.  Reports "it's the same as usual."

## 2015-12-21 NOTE — ED Notes (Signed)
Patient walked around the nurses station with 91-92% O2 on room air. Dines short of breath and dizziness.

## 2015-12-21 NOTE — H&P (Signed)
History and Physical    [name] JXB:[id number] DOB: Sep 29, 1965 DOA: 12/21/2015  PCP: Samuel Grippe, MD   Patient coming from: Home   Chief Complaint: Leg swelling, SOB   HPI: [name] is a 50 y.o. male with medical history significant for chronic combined systolic and diastolic CHF, atrial fibrillation, hypertension, asthma, and nonadherence to his treatment plan who presents the emergency department with progressive bilateral lower extremity edema and dyspnea with minimal exertion. Patient was discharged from the hospital approximately 2 weeks ago after management for acute CHF exacerbation. He was diuresed at that time for a net negative of 8.2 L and was discharged in much improved and stable condition weighing 274 pounds. Over the ensuing 2 weeks, despite his reports of adherence to his treatment plan, he has regained 25 pounds and become symptomatic with orthopnea, bilateral LE edema, and exertional dyspnea. Patient is followed by outpatient cardiology, but per the recent notes he is being discharged from the practice due to consistent nonadherence to the treatment plan. Patient denies chest pain or palpitations, and denies fevers or chills. He denies nausea, vomiting, diarrhea, melena, or hematochezia. He reports continued adherence to his medications and denies any recent change in diet or fluid intake.  ED Course: Upon arrival to the ED, patient is found to be afebrile, saturating well on room air, tachycardic in the low 100s, and with blood pressure elevated to 175/105. EKG demonstrates an atrial fibrillation and chest x-ray is consistent with mild CHF. CMP is notable for total bilirubin of 1.9 which is improved from priors. CBC is notable for a hemoglobin of 11.3 which appears to be stable relative to recent prior measurements. BNP is elevated to a value of 1046 and troponin is mildly elevated to 0.04. Lasix 80 mg IV push was administered in the emergency department and the patient is  begun diuresing appropriately. Patient has put out close to 2 L in the emergency department and reports some subjective improvement in his breathing. He'll be observed on the telemetry unit for ongoing evaluation and management of dyspnea, weight gain, and bilateral lower extremity edema suspected secondary to acute on chronic combined systolic and diastolic CHF.  Review of Systems:  All other systems reviewed and apart from HPI, are negative.  Past Medical History  Diagnosis Date  . Nonischemic cardiomyopathy (HCC)     LVEF 20-25%  . History of cardiac catheterization     Normal coronaries December 2016  . Essential hypertension   . Gastroesophageal reflux disease   . Osteoarthritis   . Peptic ulcer disease   . OSA (obstructive sleep apnea)   . Allergic rhinitis   . Atrial fibrillation (HCC)   . Asthma   . Noncompliance     Major problem leading to declining health and recurrent hospitalization    Past Surgical History  Procedure Laterality Date  . Cardiac catheterization N/A 06/14/2015    Procedure: Right/Left Heart Cath and Coronary Angiography;  Surgeon: Runell Gess, MD;  Location: [facility] INVASIVE CV LAB;  Service: Cardiovascular;  Laterality: N/A;     reports that he quit smoking about 8 years ago. His smoking use included Cigarettes. He started smoking about 27 years ago. He has a 10 pack-year smoking history. He has never used smokeless tobacco. He reports that he does not drink alcohol or use illicit drugs.  Allergies  Allergen Reactions  . Bee Venom Shortness Of Breath and Swelling  . Lipitor [Atorvastatin] Shortness Of Breath and Other (See Comments)  Nose bleed  . Aspirin Other (See Comments)    Chewable 81 mg tablets upsets patients stomach  . Banana Diarrhea  . Diltiazem Other (See Comments)    Makes heart beat fast  . Hydralazine Other (See Comments)    Patient states it causes Tachycardia    Family History  Problem Relation Age of Onset  . Colon cancer  Neg Hx   . Inflammatory bowel disease Neg Hx   . Liver disease Neg Hx   . Stroke Father   . Heart attack Father   . Aneurysm Mother     Cerebral aneurysm  . Hypertension Sister      Prior to Admission medications   Medication Sig Start Date End Date Taking? Authorizing Provider  atorvastatin (LIPITOR) 80 MG tablet Take 80 mg by mouth daily.   Yes Historical Provider, MD  beclomethasone (QVAR) 80 MCG/ACT inhaler Inhale 2 puffs into the lungs daily as needed (shortness of breath). 11/21/15  Yes Erick Blinks, MD  furosemide (LASIX) 80 MG tablet Take 1 tablet (80 mg total) by mouth 2 (two) times daily. 12/02/15  Yes Jodelle Gross, NP  metoprolol succinate (TOPROL-XL) 100 MG 24 hr tablet Take 1 tablet (100 mg total) by mouth 2 (two) times daily. Take with or immediately following a meal. 12/16/15  Yes [name], MD  potassium chloride SA (K-DUR,KLOR-CON) 20 MEQ tablet Take 1 tablet (20 mEq total) by mouth 2 (two) times daily. 12/16/15  Yes [name], MD  rivaroxaban (XARELTO) 20 MG TABS tablet Take 1 tablet (20 mg total) by mouth every evening. 11/21/15  Yes Erick Blinks, MD  sacubitril-valsartan (ENTRESTO) 24-26 MG Take 1 tablet by mouth 2 (two) times daily. Patient not taking: Reported on 12/21/2015 11/27/15   Houston Siren, MD    Physical Exam: Filed Vitals:   12/21/15 1429 12/21/15 1430 12/21/15 1600 12/21/15 1615  BP:  172/109 174/113   Pulse: 123 107 49 92  Temp:      TempSrc:      Resp: 42 26 23 23   Height:      Weight:      SpO2: 92% 97% 96% 99%      Constitutional: NAD, calm, comfortable Eyes: PERTLA, lids and conjunctivae normal ENMT: Mucous membranes are moist. Posterior pharynx clear of any exudate or lesions.   Neck: normal, supple, no masses, no thyromegaly Respiratory: Crackles bilateral lung fields. Increased work of breathing, no pallor or cyanosis.  Cardiovascular: Rate ~100 and irregular. Pretibial pitting edema bilaterally. No carotid bruits.   Abdomen:  No distension, no tenderness, no masses palpated. Bowel sounds normal.  Musculoskeletal: no clubbing / cyanosis. No joint deformity upper and lower extremities. Normal muscle tone.  Skin: no significant rashes, lesions, ulcers. Warm, dry, well-perfused. Neurologic: CN 2-12 grossly intact. Sensation intact, DTR normal. Strength 5/5 in all 4 limbs.  Psychiatric: Normal judgment and insight. Alert and oriented x 3. Normal mood and affect.     Labs on Admission: I have personally reviewed following labs and imaging studies  CBC:  Recent Labs Lab 12/14/15 2330 12/15/15 2022 12/21/15 1235  WBC 3.8* 3.5* 4.5  NEUTROABS 2.2  --  3.0  HGB 11.0* 10.8* 11.3*  HCT 34.2* 33.7* 35.4*  MCV 84.7 84.7 84.9  PLT 177 175 196   Basic Metabolic Panel:  Recent Labs Lab 12/14/15 2330 12/15/15 2022 12/21/15 1235  NA 140 141 139  K 3.7 3.0* 3.5  CL 109 107 107  CO2 25 26 27   GLUCOSE  99 94 90  BUN 24* 23* 21*  CREATININE 1.28* 1.20 1.02  CALCIUM 8.5* 8.4* 8.7*   GFR: Estimated Creatinine Clearance: 127.6 mL/min (by C-G formula based on Cr of 1.02). Liver Function Tests:  Recent Labs Lab 12/21/15 1235  AST 24  ALT 21  ALKPHOS 69  BILITOT 1.9*  PROT 6.9  ALBUMIN 3.8   No results for input(s): LIPASE, AMYLASE in the last 168 hours. No results for input(s): AMMONIA in the last 168 hours. Coagulation Profile: No results for input(s): INR, PROTIME in the last 168 hours. Cardiac Enzymes:  Recent Labs Lab 12/21/15 1235  TROPONINI 0.04*   BNP (last 3 results) No results for input(s): PROBNP in the last 8760 hours. HbA1C: No results for input(s): HGBA1C in the last 72 hours. CBG: No results for input(s): GLUCAP in the last 168 hours. Lipid Profile: No results for input(s): CHOL, HDL, LDLCALC, TRIG, CHOLHDL, LDLDIRECT in the last 72 hours. Thyroid Function Tests: No results for input(s): TSH, T4TOTAL, FREET4, T3FREE, THYROIDAB in the last 72 hours. Anemia Panel: No results for  input(s): VITAMINB12, FOLATE, FERRITIN, TIBC, IRON, RETICCTPCT in the last 72 hours. Urine analysis:    Component Value Date/Time   COLORURINE YELLOW 10/29/2015 1012   APPEARANCEUR CLEAR 10/29/2015 1012   LABSPEC 1.019 10/29/2015 1012   PHURINE 6.0 10/29/2015 1012   GLUCOSEU NEGATIVE 10/29/2015 1012   GLUCOSEU NEG mg/dL 40/98/1191 4782   HGBUR NEGATIVE 10/29/2015 1012   BILIRUBINUR NEGATIVE 10/29/2015 1012   KETONESUR NEGATIVE 10/29/2015 1012   PROTEINUR 30* 10/29/2015 1012   UROBILINOGEN 0.2 08/08/2014 1445   NITRITE NEGATIVE 10/29/2015 1012   LEUKOCYTESUR NEGATIVE 10/29/2015 1012   Sepsis Labs: (procalcitonin:4,lacticidven:4) )No results found for this or any previous visit (from the past 240 hour(s)).   Radiological Exams on Admission: Dg Chest 2 View  12/21/2015  CLINICAL DATA:  Shortness of Breath EXAM: CHEST  2 VIEW COMPARISON:  12/15/2015 FINDINGS: Cardiac shadow is enlarged. Mild vascular congestion is noted. No focal infiltrate or sizable effusion is seen. IMPRESSION: Mild CHF. Electronically Signed   By: Alcide Clever M.D.   On: 12/21/2015 12:56    EKG: Independently reviewed. Atrial fibrillation   Assessment/Plan  1. Acute on chronic combined systolic and diastolic CHF  - Discharged on 9/56/21 with weight 274 lbs; wt is now 299 lbs on presentation  - CXR is c/w acute CHF and BNP elevated to 1046; LE edema and crackles on exam  - TTE (09/30/15) with EF 20-25%, severe concentric hypertrophy, moderate LAE, and mild TR; diastolic fxn not noted d/t AF  - Given 80 mg IVP Lasix in ED and has begun diuresing appropriately  - Continue diuresis with Lasix 60 mg IV q12h  - SLIV, fluid-restrict diet, follow daily wts and strict I/O's  - Previously on Entresto, reports was stopped d/t hypotension; will start low-dose ACE-i  - Continue Toprol as tolerated  - Monitor on telemetry   2. Elevated troponin  - Initial troponin slightly elevated to 0.04  - Pt denies CP and no  acute ischemic features noted on EKG  - Likely represents a demand ischemia in setting of markedly elevated BP and acute CHF  - Monitor on telemetry for ischemic changes, trend troponin    3. Atrial fibrillation, chronic  - CHADS-VASc at least 2 (CHF, HTN)  - Continue AC with Xarelto, rate-control with Toprol  - Rate mainly in 90's to low 100's in ED with occasional spikes into 120's; will give evening dose Toprol  now, monitor on tele, and treat further prn   4. Hypertension  - Elevated on presentation, anticipate improvement with diuresis  - On Toprol at home; previously on Entresto, but reports stopped d/t hypotension  - Low-dose ACE-i started on admission; continue Toprol   5. Asthma  - No sign of exacerbation currently  - Continue daily Qvar   6. Normocytic anemia  - Hgb 11.3 on admission, stable relative to priors, no sign of bleeding  - Labs consistent with iron-deficiency in March 2017, will supplement     DVT prophylaxis: Xarelto   Code Status: Full  Family Communication: Discussed with patient  Disposition Plan: Observe on telemetry  Consults called: None  Admission status: Observation    Briscoe Deutscher, MD [facility] Pager [phone]  If 7PM-7AM, please contact night-coverage [email or web address] Password [facility]  12/21/2015, 4:47 PM

## 2015-12-21 NOTE — ED Notes (Signed)
Unable to reach nurse to give report, will call back

## 2015-12-21 NOTE — ED Provider Notes (Signed)
CSN: [id number]     Arrival date & time 12/21/15  1113 History   By signing my name below, I, [name], attest that this documentation has been prepared under the direction and in the presence of Glynn Octave, MD . Electronically Signed: [name], Scribe. 12/21/2015. 1:23 PM.    Chief Complaint  Patient presents with  . Leg Swelling    The history is provided by the patient. No language interpreter was used.    HPI Comments:  [name] is a 50 y.o. male with PMHx of AFibwho presents to the Emergency Department complaining of leg swelling.Pt states he always has leg swelling, but feels as if it worse than normal.He is taking Lasix and xerelto 80 mg, but feels legs are more swollen while taking Lasix. He last took lasix last night before bed. Pt has large bulge on right ankle which he states is normal for him. Pt also complain of associated SOB which is worsened when laying down,chest pain, and unproductive cough. He states chest pain began 30 minutes before being examined and has been constant. He notes photophobia, but is normal per patient. He denies any fever. No other complaints noted at this time.   Past Medical History  Diagnosis Date  . Nonischemic cardiomyopathy (HCC)     LVEF 20-25%  . History of cardiac catheterization     Normal coronaries December 2016  . Essential hypertension   . Gastroesophageal reflux disease   . Osteoarthritis   . Peptic ulcer disease   . OSA (obstructive sleep apnea)   . Allergic rhinitis   . Atrial fibrillation (HCC)   . Asthma   . Noncompliance     Major problem leading to declining health and recurrent hospitalization   Past Surgical History  Procedure Laterality Date  . Cardiac catheterization N/A 06/14/2015    Procedure: Right/Left Heart Cath and Coronary Angiography;  Surgeon: Runell Gess, MD;  Location: [facility] INVASIVE CV LAB;  Service: Cardiovascular;  Laterality: N/A;   Family History  Problem Relation Age of Onset  .  Colon cancer Neg Hx   . Inflammatory bowel disease Neg Hx   . Liver disease Neg Hx   . Stroke Father   . Heart attack Father   . Aneurysm Mother     Cerebral aneurysm  . Hypertension Sister    Social History  Substance Use Topics  . Smoking status: Former Smoker -- 0.50 packs/day for 20 years    Types: Cigarettes    Start date: 04/26/1988    Quit date: 06/23/2007  . Smokeless tobacco: Never Used     Comment: 1 ppd former smoker  . Alcohol Use: No     Comment: No etoh since 2009    Review of Systems  Constitutional: Negative for fever.  Respiratory: Positive for cough and shortness of breath.   Cardiovascular: Positive for chest pain and leg swelling.    10 Systems reviewed and are negative for acute change except as noted in the HPI.    Allergies  Bee venom; Lipitor; Aspirin; Banana; Diltiazem; and Hydralazine  Home Medications   Prior to Admission medications   Medication Sig Start Date End Date Taking? Authorizing Provider  atorvastatin (LIPITOR) 80 MG tablet Take 80 mg by mouth daily.   Yes Historical Provider, MD  beclomethasone (QVAR) 80 MCG/ACT inhaler Inhale 2 puffs into the lungs daily as needed (shortness of breath). 11/21/15  Yes Erick Blinks, MD  furosemide (LASIX) 80 MG tablet Take 1 tablet (80  mg total) by mouth 2 (two) times daily. 12/02/15  Yes Jodelle Gross, NP  metoprolol succinate (TOPROL-XL) 100 MG 24 hr tablet Take 1 tablet (100 mg total) by mouth 2 (two) times daily. Take with or immediately following a meal. 12/16/15  Yes [name], MD  potassium chloride SA (K-DUR,KLOR-CON) 20 MEQ tablet Take 1 tablet (20 mEq total) by mouth 2 (two) times daily. 12/16/15  Yes [name], MD  rivaroxaban (XARELTO) 20 MG TABS tablet Take 1 tablet (20 mg total) by mouth every evening. 11/21/15  Yes Erick Blinks, MD  sacubitril-valsartan (ENTRESTO) 24-26 MG Take 1 tablet by mouth 2 (two) times daily. Patient not taking: Reported on 12/21/2015 11/27/15   Houston Siren, MD   BP 166/107 mmHg  Pulse 63  Temp(Src) 98.4 F (36.9 C) (Oral)  Resp 18  Ht 6' (1.829 m)  Wt 299 lb (135.626 kg)  BMI 40.54 kg/m2  SpO2 100% Physical Exam  Constitutional: He is oriented to person, place, and time. He appears well-developed and well-nourished. No distress.  HENT:  Head: Normocephalic and atraumatic.  Mouth/Throat: Oropharynx is clear and moist. No oropharyngeal exudate.  Sunglasses,  Eyes: Conjunctivae and EOM are normal. Pupils are equal, round, and reactive to light.  Neck: Normal range of motion. Neck supple.  No meningismus.  Cardiovascular: Normal rate, normal heart sounds and intact distal pulses.   No murmur heard. Irregular rhythm  Pulmonary/Chest: Effort normal. No respiratory distress.  Decreased breath sounds at base  Abdominal: Soft. There is no tenderness. There is no rebound and no guarding.  Musculoskeletal: Normal range of motion. He exhibits edema. He exhibits no tenderness.  +3 pitting edema to thighs bilaterally  Intact dp pulses  Neurological: He is alert and oriented to person, place, and time. No cranial nerve deficit. He exhibits normal muscle tone. Coordination normal.   5/5 strength throughout. CN 2-12 intact.Equal grip strength.   Skin: Skin is warm.  Psychiatric: He has a normal mood and affect. His behavior is normal.  Nursing note and vitals reviewed.   ED Course  Procedures (including critical care time) Labs Review Labs Reviewed  CBC WITH DIFFERENTIAL/PLATELET - Abnormal; Notable for the following:    RBC 4.17 (*)    Hemoglobin 11.3 (*)    HCT 35.4 (*)    All other components within normal limits  COMPREHENSIVE METABOLIC PANEL - Abnormal; Notable for the following:    BUN 21 (*)    Calcium 8.7 (*)    Total Bilirubin 1.9 (*)    All other components within normal limits  TROPONIN I - Abnormal; Notable for the following:    Troponin I 0.04 (*)    All other components within normal limits  BRAIN NATRIURETIC  PEPTIDE - Abnormal; Notable for the following:    B Natriuretic Peptide 1046.0 (*)    All other components within normal limits  TROPONIN I - Abnormal; Notable for the following:    Troponin I 0.03 (*)    All other components within normal limits  MAGNESIUM  TROPONIN I  TROPONIN I  BASIC METABOLIC PANEL    Imaging Review Dg Chest 2 View  12/21/2015  CLINICAL DATA:  Shortness of Breath EXAM: CHEST  2 VIEW COMPARISON:  12/15/2015 FINDINGS: Cardiac shadow is enlarged. Mild vascular congestion is noted. No focal infiltrate or sizable effusion is seen. IMPRESSION: Mild CHF. Electronically Signed   By: Alcide Clever M.D.   On: 12/21/2015 12:56   I have personally reviewed and  evaluated these images and lab results as part of my medical decision-making.   EKG Interpretation   Date/Time:  Saturday December 21 2015 12:32:59 EDT Ventricular Rate:  114 PR Interval:    QRS Duration: 79 QT Interval:  332 QTC Calculation: 458 R Axis:   64 Text Interpretation:  Atrial fibrillation Borderline repolarization  abnormality Rate faster Confirmed by Manus Gunning  MD, Timmie Dugue ([id number]) on  12/21/2015 1:08:31 PM      MDM   Final diagnoses:  Acute systolic CHF (congestive heart failure) (HCC)   He presents with worsening leg swelling and shortness of breath since yesterday. States compliance with xarelto as well as Lasix.  CXR with mild CHF, IV lasix given.  Afib, Rate 110s.  LVEF 20-25% on 09/30/15. Cardiac catheterization in December 2016 revealed normal coronary arteries.  Diuresing with IV lasix in the ED>  Becomes dyspneic with ambulation and tachycardic and tachypneic.  He is about 30 pounds above his previous discharge weight. Will admit for further diuresis. D/w Dr. Antionette Char.  I personally performed the services described in this documentation, which was scribed in my presence. The recorded information has been reviewed and is accurate.     Glynn Octave, MD 12/21/15 [phone]

## 2015-12-21 NOTE — ED Notes (Signed)
MD at bedside. 

## 2015-12-22 ENCOUNTER — Other Ambulatory Visit: Payer: Self-pay

## 2015-12-22 DIAGNOSIS — M1990 Unspecified osteoarthritis, unspecified site: Secondary | ICD-10-CM | POA: Diagnosis present

## 2015-12-22 DIAGNOSIS — R748 Abnormal levels of other serum enzymes: Secondary | ICD-10-CM | POA: Diagnosis present

## 2015-12-22 DIAGNOSIS — Z9114 Patient's other noncompliance with medication regimen: Secondary | ICD-10-CM | POA: Diagnosis not present

## 2015-12-22 DIAGNOSIS — R7989 Other specified abnormal findings of blood chemistry: Secondary | ICD-10-CM | POA: Diagnosis not present

## 2015-12-22 DIAGNOSIS — I482 Chronic atrial fibrillation: Secondary | ICD-10-CM | POA: Diagnosis present

## 2015-12-22 DIAGNOSIS — I428 Other cardiomyopathies: Secondary | ICD-10-CM | POA: Diagnosis present

## 2015-12-22 DIAGNOSIS — Z9111 Patient's noncompliance with dietary regimen: Secondary | ICD-10-CM | POA: Diagnosis not present

## 2015-12-22 DIAGNOSIS — I5043 Acute on chronic combined systolic (congestive) and diastolic (congestive) heart failure: Secondary | ICD-10-CM | POA: Diagnosis present

## 2015-12-22 DIAGNOSIS — Z888 Allergy status to other drugs, medicaments and biological substances status: Secondary | ICD-10-CM | POA: Diagnosis not present

## 2015-12-22 DIAGNOSIS — Z9119 Patient's noncompliance with other medical treatment and regimen: Secondary | ICD-10-CM | POA: Diagnosis not present

## 2015-12-22 DIAGNOSIS — I110 Hypertensive heart disease with heart failure: Secondary | ICD-10-CM | POA: Diagnosis present

## 2015-12-22 DIAGNOSIS — D649 Anemia, unspecified: Secondary | ICD-10-CM | POA: Diagnosis present

## 2015-12-22 DIAGNOSIS — Z886 Allergy status to analgesic agent status: Secondary | ICD-10-CM | POA: Diagnosis not present

## 2015-12-22 DIAGNOSIS — R600 Localized edema: Secondary | ICD-10-CM | POA: Diagnosis present

## 2015-12-22 DIAGNOSIS — I472 Ventricular tachycardia: Secondary | ICD-10-CM | POA: Diagnosis not present

## 2015-12-22 DIAGNOSIS — G4733 Obstructive sleep apnea (adult) (pediatric): Secondary | ICD-10-CM | POA: Diagnosis present

## 2015-12-22 DIAGNOSIS — Z87891 Personal history of nicotine dependence: Secondary | ICD-10-CM | POA: Diagnosis not present

## 2015-12-22 DIAGNOSIS — J45909 Unspecified asthma, uncomplicated: Secondary | ICD-10-CM | POA: Diagnosis present

## 2015-12-22 DIAGNOSIS — Z7901 Long term (current) use of anticoagulants: Secondary | ICD-10-CM | POA: Diagnosis not present

## 2015-12-22 LAB — TROPONIN I
TROPONIN I: 0.03 ng/mL — AB (ref <0.03)
Troponin I: 0.04 ng/mL (ref <0.03)

## 2015-12-22 LAB — BASIC METABOLIC PANEL
ANION GAP: 6 (ref 5–15)
BUN: 18 mg/dL (ref 6–20)
CHLORIDE: 109 mmol/L (ref 101–111)
CO2: 25 mmol/L (ref 22–32)
Calcium: 8.4 mg/dL — ABNORMAL LOW (ref 8.9–10.3)
Creatinine, Ser: 1.01 mg/dL (ref 0.61–1.24)
Glucose, Bld: 85 mg/dL (ref 65–99)
POTASSIUM: 3.4 mmol/L — AB (ref 3.5–5.1)
SODIUM: 140 mmol/L (ref 135–145)

## 2015-12-22 MED ORDER — BUDESONIDE 0.25 MG/2ML IN SUSP
0.2500 mg | Freq: Two times a day (BID) | RESPIRATORY_TRACT | Status: DC
  Administered 2015-12-22 – 2015-12-25 (×7): 0.25 mg via RESPIRATORY_TRACT
  Filled 2015-12-22 (×7): qty 2

## 2015-12-22 MED ORDER — POTASSIUM CHLORIDE CRYS ER 20 MEQ PO TBCR
40.0000 meq | EXTENDED_RELEASE_TABLET | Freq: Once | ORAL | Status: AC
  Administered 2015-12-22: 40 meq via ORAL
  Filled 2015-12-22: qty 2

## 2015-12-22 MED ORDER — FUROSEMIDE 10 MG/ML IJ SOLN
80.0000 mg | Freq: Two times a day (BID) | INTRAMUSCULAR | Status: DC
  Administered 2015-12-23 – 2015-12-25 (×6): 80 mg via INTRAVENOUS
  Filled 2015-12-22 (×6): qty 8

## 2015-12-22 NOTE — Progress Notes (Signed)
PROGRESS NOTE    [name]  OBS:[id number] DOB: Feb 09, 1966 DOA: 12/21/2015 PCP: Pearson Grippe, MD     Brief Narrative:  50 year old man admitted on 7/1 for acute CHF. He is well known to [place] for frequent admissions for this same mainly due to medication and diet nonadherence.   Assessment & Plan:   Principal Problem:   Acute on chronic combined systolic (congestive) and diastolic (congestive) heart failure (HCC) Active Problems:   Essential hypertension   Asthma   Chronic atrial fibrillation (HCC)   Normocytic anemia   Noncompliance with diet and medication regimen   Acute on chronic combined systolic and diastolic CHF (congestive heart failure) (HCC)   Elevated troponin   Acute on chronic combined CHF -Most recent 2-D echo in April 2017 with ejection fraction of 20-25% technically unable to determine LV diastolic function. -He has diuresed 3.1 L since admission. -We'll increase Lasix to 80 mg IV twice a day and continue to strive for negative fluid balance. -Continue metoprolol, is already refusing lisinopril.  Chronic atrial fibrillation -Read controlled, continue Toprol, on xarelto for anticoagulation.  Elevated troponin -Flat, maximal troponin 0.04, no chest pain and no acute ischemic changes on EKG. -Likely represents demand ischemia in the setting of acute CHF.   DVT prophylaxis: Fully anticoagulated Code Status: Full code Family Communication: Patient only Disposition Plan: To be determined, expected hospitalization to last at least 2-3 days  Consultants:   None   Procedures:   None  Antimicrobials:   None    Subjective: Denies chest pain, shortness of breath, states that his leg edema is mostly bothersome  Objective: Filed Vitals:   12/22/15 0549 12/22/15 0646 12/22/15 1124 12/22/15 1431  BP: 132/104 126/66  106/79  Pulse: 84   97  Temp: 99.3 F (37.4 C)   98.8 F (37.1 C)  TempSrc: Oral   Oral  Resp: 22   22  Height:      Weight: 136.578  kg (301 lb 1.6 oz)     SpO2: 92%  95% 97%    Intake/Output Summary (Last 24 hours) at 12/22/15 1633 Last data filed at 12/22/15 1121  Gross per 24 hour  Intake    960 ml  Output    950 ml  Net     10 ml   Filed Weights   12/21/15 1117 12/21/15 1851 12/22/15 0549  Weight: 135.626 kg (299 lb) 138.347 kg (305 lb) 136.578 kg (301 lb 1.6 oz)    Examination:  General exam: Alert, awake, oriented x 3 Respiratory system: Clear to auscultation. Respiratory effort normal. Cardiovascular system:Irregular. No murmurs, rubs, gallops. Gastrointestinal system: Abdomen is nondistended, soft and nontender. No organomegaly or masses felt. Normal bowel sounds heard. Central nervous system: Alert and oriented. No focal neurological deficits. Extremities: 3-4+ pitting edema bilaterally Skin: No rashes, lesions or ulcers    Data Reviewed: I have personally reviewed following labs and imaging studies  CBC:  Recent Labs Lab 12/15/15 2022 12/21/15 1235  WBC 3.5* 4.5  NEUTROABS  --  3.0  HGB 10.8* 11.3*  HCT 33.7* 35.4*  MCV 84.7 84.9  PLT 175 196   Basic Metabolic Panel:  Recent Labs Lab 12/15/15 2022 12/21/15 1235 12/21/15 1656 12/22/15 0551  NA 141 139  --  140  K 3.0* 3.5  --  3.4*  CL 107 107  --  109  CO2 26 27  --  25  GLUCOSE 94 90  --  85  BUN 23* 21*  --  18  CREATININE 1.20 1.02  --  1.01  CALCIUM 8.4* 8.7*  --  8.4*  MG  --   --  2.0  --    GFR: Estimated Creatinine Clearance: 129.4 mL/min (by C-G formula based on Cr of 1.01). Liver Function Tests:  Recent Labs Lab 12/21/15 1235  AST 24  ALT 21  ALKPHOS 69  BILITOT 1.9*  PROT 6.9  ALBUMIN 3.8   No results for input(s): LIPASE, AMYLASE in the last 168 hours. No results for input(s): AMMONIA in the last 168 hours. Coagulation Profile: No results for input(s): INR, PROTIME in the last 168 hours. Cardiac Enzymes:  Recent Labs Lab 12/21/15 1235 12/21/15 1656 12/21/15 2331 12/22/15 0551  TROPONINI  0.04* 0.03* 0.04* 0.03*   BNP (last 3 results) No results for input(s): PROBNP in the last 8760 hours. HbA1C: No results for input(s): HGBA1C in the last 72 hours. CBG: No results for input(s): GLUCAP in the last 168 hours. Lipid Profile: No results for input(s): CHOL, HDL, LDLCALC, TRIG, CHOLHDL, LDLDIRECT in the last 72 hours. Thyroid Function Tests: No results for input(s): TSH, T4TOTAL, FREET4, T3FREE, THYROIDAB in the last 72 hours. Anemia Panel: No results for input(s): VITAMINB12, FOLATE, FERRITIN, TIBC, IRON, RETICCTPCT in the last 72 hours. Urine analysis:    Component Value Date/Time   COLORURINE YELLOW 10/29/2015 1012   APPEARANCEUR CLEAR 10/29/2015 1012   LABSPEC 1.019 10/29/2015 1012   PHURINE 6.0 10/29/2015 1012   GLUCOSEU NEGATIVE 10/29/2015 1012   GLUCOSEU NEG mg/dL 16/03/9603 5409   HGBUR NEGATIVE 10/29/2015 1012   BILIRUBINUR NEGATIVE 10/29/2015 1012   KETONESUR NEGATIVE 10/29/2015 1012   PROTEINUR 30* 10/29/2015 1012   UROBILINOGEN 0.2 08/08/2014 1445   NITRITE NEGATIVE 10/29/2015 1012   LEUKOCYTESUR NEGATIVE 10/29/2015 1012   Sepsis Labs: (procalcitonin:4,lacticidven:4)  )No results found for this or any previous visit (from the past 240 hour(s)).       Radiology Studies: Dg Chest 2 View  12/21/2015  CLINICAL DATA:  Shortness of Breath EXAM: CHEST  2 VIEW COMPARISON:  12/15/2015 FINDINGS: Cardiac shadow is enlarged. Mild vascular congestion is noted. No focal infiltrate or sizable effusion is seen. IMPRESSION: Mild CHF. Electronically Signed   By: Alcide Clever M.D.   On: 12/21/2015 12:56        Scheduled Meds: . budesonide (PULMICORT) nebulizer solution  0.25 mg Nebulization BID  . ferrous sulfate  325 mg Oral BID WC  . furosemide  60 mg Intravenous Q12H  . lisinopril  5 mg Oral Daily  . metoprolol succinate  100 mg Oral BID  . nitroGLYCERIN  1 inch Topical Q8H  . potassium chloride SA  20 mEq Oral BID  . rivaroxaban  20 mg Oral  QPM  . sodium chloride flush  3 mL Intravenous Q12H   Continuous Infusions:       Time spent: 25 minutes. Greater than 50% of this time was spent in direct contact with the patient coordinating care.     Chaya Jan, MD [facility] Pager [phone]  If 7PM-7AM, please contact night-coverage [email or web address] Password [facility] 12/22/2015, 4:33 PM

## 2015-12-22 NOTE — Progress Notes (Signed)
Notified Dr. Ardyth Harps that the patient refused to take his Lisinopril.  He stated he had a reaction to it before.

## 2015-12-22 NOTE — Care Management Obs Status (Signed)
MEDICARE OBSERVATION STATUS NOTIFICATION   Patient Details  Name: [name] MRN: [id number] Date of Birth: 1966/04/09   Medicare Observation Status Notification Given:  Yes    Fuller Plan, RN 12/22/2015, 10:46 AM

## 2015-12-22 NOTE — Progress Notes (Signed)
In to see patient at this time, and noticed patient to have 9 beat run of non sustained v-tach. Patient asymptomatic, and resting peacefully in bed. K 3.5, Mag 2.0.  MD notified at this time, new orders obtained. Will continue to monitor the patient closely.

## 2015-12-23 ENCOUNTER — Ambulatory Visit: Payer: Medicare Other | Admitting: Cardiovascular Disease

## 2015-12-23 LAB — BASIC METABOLIC PANEL
Anion gap: 9 (ref 5–15)
BUN: 22 mg/dL — AB (ref 6–20)
CO2: 25 mmol/L (ref 22–32)
CREATININE: 1.21 mg/dL (ref 0.61–1.24)
Calcium: 8.3 mg/dL — ABNORMAL LOW (ref 8.9–10.3)
Chloride: 106 mmol/L (ref 101–111)
Glucose, Bld: 91 mg/dL (ref 65–99)
POTASSIUM: 3.2 mmol/L — AB (ref 3.5–5.1)
SODIUM: 140 mmol/L (ref 135–145)

## 2015-12-23 NOTE — Progress Notes (Signed)
PROGRESS NOTE    [name]  ZOX:[id number] DOB: 1966/05/01 DOA: 12/21/2015 PCP: Pearson Grippe, MD     Brief Narrative:  50 year old man admitted on 7/1 for acute CHF. He is well known to [place] for frequent admissions for this same mainly due to medication and diet nonadherence.   Assessment & Plan:   Principal Problem:   Acute on chronic combined systolic (congestive) and diastolic (congestive) heart failure (HCC) Active Problems:   Essential hypertension   Asthma   Chronic atrial fibrillation (HCC)   Normocytic anemia   Noncompliance with diet and medication regimen   Acute on chronic combined systolic and diastolic CHF (congestive heart failure) (HCC)   Elevated troponin   Acute on chronic combined CHF -Most recent 2-D echo in April 2017 with ejection fraction of 20-25% technically unable to determine LV diastolic function. -He has diuresed 4.6 L since admission. -Continue Lasix at 80 mg twice daily and continue to strive for negative fluid balance. -Still of significant volume overload on exam. -Continue metoprolol, is already refusing lisinopril.  Chronic atrial fibrillation -Read controlled, continue Toprol, on xarelto for anticoagulation.  Elevated troponin -Flat, maximal troponin 0.04, no chest pain and no acute ischemic changes on EKG. -Likely represents demand ischemia in the setting of acute CHF.  Nonsustained ventricular tachycardia  -secondary to depressed ejection fraction, no further workup, AICD not indicated given his noncompliance, has been seen by cardiology for this in the past. -Continue metoprolol.   DVT prophylaxis: Fully anticoagulated Code Status: Full code Family Communication: Patient only Disposition Plan: To be determined, expect hospitalization to last at least 3-4 days  Consultants:   None   Procedures:   None  Antimicrobials:   None    Subjective: Denies chest pain, shortness of breath, states that his leg edema is mostly  bothersome  Objective: Filed Vitals:   12/22/15 2100 12/23/15 0500 12/23/15 0800 12/23/15 1300  BP: 129/82 136/85  128/107  Pulse: 86 92    Temp: 98.7 F (37.1 C) 98.9 F (37.2 C)  98.5 F (36.9 C)  TempSrc: Oral Oral  Oral  Resp: Height:      Weight:  133.993 kg (295 lb 6.4 oz)    SpO2: 97% 98% 90% 95%    Intake/Output Summary (Last 24 hours) at 12/23/15 1516 Last data filed at 12/23/15 0536  Gross per 24 hour  Intake      0 ml  Output   2000 ml  Net  -2000 ml   Filed Weights   12/21/15 1851 12/22/15 0549 12/23/15 0500  Weight: 138.347 kg (305 lb) 136.578 kg (301 lb 1.6 oz) 133.993 kg (295 lb 6.4 oz)    Examination:  General exam: Alert, awake, oriented x 3 Respiratory system: Clear to auscultation. Respiratory effort normal. Cardiovascular system:Irregular. No murmurs, rubs, gallops. Gastrointestinal system: Abdomen is nondistended, soft and nontender. No organomegaly or masses felt. Normal bowel sounds heard. Central nervous system: Alert and oriented. No focal neurological deficits. Extremities: 3-4+ pitting edema bilaterally Skin: No rashes, lesions or ulcers    Data Reviewed: I have personally reviewed following labs and imaging studies  CBC:  Recent Labs Lab 12/21/15 1235  WBC 4.5  NEUTROABS 3.0  HGB 11.3*  HCT 35.4*  MCV 84.9  PLT 196   Basic Metabolic Panel:  Recent Labs Lab 12/21/15 1235 12/21/15 1656 12/22/15 0551 12/23/15 0504  NA 139  --  140 140  K 3.5  --  3.4* 3.2*  CL  107  --  109 106  CO2 27  --  25 25  GLUCOSE 90  --  85 91  BUN 21*  --  18 22*  CREATININE 1.02  --  1.01 1.21  CALCIUM 8.7*  --  8.4* 8.3*  MG  --  2.0  --   --    GFR: Estimated Creatinine Clearance: 107 mL/min (by C-G formula based on Cr of 1.21). Liver Function Tests:  Recent Labs Lab 12/21/15 1235  AST 24  ALT 21  ALKPHOS 69  BILITOT 1.9*  PROT 6.9  ALBUMIN 3.8   No results for input(s): LIPASE, AMYLASE in the last 168 hours. No  results for input(s): AMMONIA in the last 168 hours. Coagulation Profile: No results for input(s): INR, PROTIME in the last 168 hours. Cardiac Enzymes:  Recent Labs Lab 12/21/15 1235 12/21/15 1656 12/21/15 2331 12/22/15 0551  TROPONINI 0.04* 0.03* 0.04* 0.03*   BNP (last 3 results) No results for input(s): PROBNP in the last 8760 hours. HbA1C: No results for input(s): HGBA1C in the last 72 hours. CBG: No results for input(s): GLUCAP in the last 168 hours. Lipid Profile: No results for input(s): CHOL, HDL, LDLCALC, TRIG, CHOLHDL, LDLDIRECT in the last 72 hours. Thyroid Function Tests: No results for input(s): TSH, T4TOTAL, FREET4, T3FREE, THYROIDAB in the last 72 hours. Anemia Panel: No results for input(s): VITAMINB12, FOLATE, FERRITIN, TIBC, IRON, RETICCTPCT in the last 72 hours. Urine analysis:    Component Value Date/Time   COLORURINE YELLOW 10/29/2015 1012   APPEARANCEUR CLEAR 10/29/2015 1012   LABSPEC 1.019 10/29/2015 1012   PHURINE 6.0 10/29/2015 1012   GLUCOSEU NEGATIVE 10/29/2015 1012   GLUCOSEU NEG mg/dL 78/67/6720 9470   HGBUR NEGATIVE 10/29/2015 1012   BILIRUBINUR NEGATIVE 10/29/2015 1012   KETONESUR NEGATIVE 10/29/2015 1012   PROTEINUR 30* 10/29/2015 1012   UROBILINOGEN 0.2 08/08/2014 1445   NITRITE NEGATIVE 10/29/2015 1012   LEUKOCYTESUR NEGATIVE 10/29/2015 1012   Sepsis Labs: @LABRCNTIP (procalcitonin:4,lacticidven:4)  )No results found for this or any previous visit (from the past 240 hour(s)).       Radiology Studies: No results found.      Scheduled Meds: . budesonide (PULMICORT) nebulizer solution  0.25 mg Nebulization BID  . ferrous sulfate  325 mg Oral BID WC  . furosemide  80 mg Intravenous Q12H  . lisinopril  5 mg Oral Daily  . metoprolol succinate  100 mg Oral BID  . nitroGLYCERIN  1 inch Topical Q8H  . potassium chloride SA  20 mEq Oral BID  . rivaroxaban  20 mg Oral QPM  . sodium chloride flush  3 mL Intravenous Q12H    Continuous Infusions:    LOS: 1 day    Time spent: 25 minutes. Greater than 50% of this time was spent in direct contact with the patient coordinating care.     Chaya Jan, MD [facility] Pager [phone]  If 7PM-7AM, please contact night-coverage [email or web address] Password [email or web address] 12/23/2015, 3:16 PM

## 2015-12-23 NOTE — Progress Notes (Signed)
Notified Dr. Ardyth Harps via text page that the patient had a 16 beat v tach this am.  Metoprpolol given.  He continues to refuse the lisinopril.

## 2015-12-23 NOTE — Care Management Important Message (Signed)
Important Message  Patient Details  Name: [name] MRN: [id number] Date of Birth: 09-Jun-1966   Medicare Important Message Given:  Yes    Leler Brion, [name], RN 12/23/2015, 3:53 PM

## 2015-12-24 LAB — BASIC METABOLIC PANEL
ANION GAP: 8 (ref 5–15)
BUN: 25 mg/dL — ABNORMAL HIGH (ref 6–20)
CALCIUM: 8.3 mg/dL — AB (ref 8.9–10.3)
CO2: 26 mmol/L (ref 22–32)
CREATININE: 1.16 mg/dL (ref 0.61–1.24)
Chloride: 106 mmol/L (ref 101–111)
GFR calc Af Amer: >60 mL/min (ref >60)
GLUCOSE: 85 mg/dL (ref 65–99)
Potassium: 3 mmol/L — ABNORMAL LOW (ref 3.5–5.1)
Sodium: 140 mmol/L (ref 135–145)

## 2015-12-24 LAB — MAGNESIUM: Magnesium: 1.8 mg/dL (ref 1.7–2.4)

## 2015-12-24 MED ORDER — POTASSIUM CHLORIDE CRYS ER 20 MEQ PO TBCR
40.0000 meq | EXTENDED_RELEASE_TABLET | ORAL | Status: AC
  Administered 2015-12-24 (×2): 40 meq via ORAL
  Filled 2015-12-24 (×3): qty 2

## 2015-12-24 NOTE — Progress Notes (Signed)
PROGRESS NOTE    [name]  ZOX:[id number] DOB: 1965-12-21 DOA: 12/21/2015 PCP: Pearson Grippe, MD     Brief Narrative:  50 year old man admitted on 7/1 for acute CHF. He is well known to [place] for frequent admissions for this same mainly due to medication and diet nonadherence.   Assessment & Plan:   Principal Problem:   Acute on chronic combined systolic (congestive) and diastolic (congestive) heart failure (HCC) Active Problems:   Essential hypertension   Asthma   Chronic atrial fibrillation (HCC)   Normocytic anemia   Noncompliance with diet and medication regimen   Acute on chronic combined systolic and diastolic CHF (congestive heart failure) (HCC)   Elevated troponin   Acute on chronic combined CHF -Most recent 2-D echo in April 2017 with ejection fraction of 20-25% technically unable to determine LV diastolic function. -He has diuresed 6.8 L since admission. -Still with significant volume overload on exam. -Continue Lasix at 80 mg IV twice a day. -Continue metoprolol, is already refusing lisinopril.  Chronic atrial fibrillation -Read controlled, continue Toprol, on xarelto for anticoagulation.  Elevated troponin -Flat, maximal troponin 0.04, no chest pain and no acute ischemic changes on EKG. -Likely represents demand ischemia in the setting of acute CHF.  Nonsustained ventricular tachycardia  -secondary to depressed ejection fraction, no further workup, AICD not indicated given his noncompliance, has been seen by cardiology for this in the past. -Continue metoprolol.   DVT prophylaxis: Fully anticoagulated Code Status: Full code Family Communication: Patient only Disposition Plan: To be determined, anticipate likely discharge home within 48 hours.  Consultants:   None   Procedures:   None  Antimicrobials:   None    Subjective: Denies chest pain, shortness of breath, states that his leg edema is mostly bothersome  Objective: Filed Vitals:   12/23/15 2115 12/24/15 0528 12/24/15 0613 12/24/15 0736  BP: 150/89 153/95    Pulse: 97 85    Temp: 100.2 F (37.9 C) 98.6 F (37 C)    TempSrc: Oral Oral    Resp: 20 20    Height:      Weight:   129.502 kg (285 lb 8 oz)   SpO2: 98% 97%  96%    Intake/Output Summary (Last 24 hours) at 12/24/15 1111 Last data filed at 12/24/15 0930  Gross per 24 hour  Intake    480 ml  Output   2700 ml  Net  -2220 ml   Filed Weights   12/22/15 0549 12/23/15 0500 12/24/15 0613  Weight: 136.578 kg (301 lb 1.6 oz) 133.993 kg (295 lb 6.4 oz) 129.502 kg (285 lb 8 oz)    Examination:  General exam: Alert, awake, oriented x 3 Respiratory system: Clear to auscultation. Respiratory effort normal. Cardiovascular system:Irregular. No murmurs, rubs, gallops. Gastrointestinal system: Abdomen is nondistended, soft and nontender. No organomegaly or masses felt. Normal bowel sounds heard. Central nervous system: Alert and oriented. No focal neurological deficits. Extremities: 3-4+ pitting edema bilaterally Skin: No rashes, lesions or ulcers    Data Reviewed: I have personally reviewed following labs and imaging studies  CBC:  Recent Labs Lab 12/21/15 1235  WBC 4.5  NEUTROABS 3.0  HGB 11.3*  HCT 35.4*  MCV 84.9  PLT 196   Basic Metabolic Panel:  Recent Labs Lab 12/21/15 1235 12/21/15 1656 12/22/15 0551 12/23/15 0504 12/24/15 0455  NA 139  --  140 140 140  K 3.5  --  3.4* 3.2* 3.0*  CL 107  --  109  106 106  CO2 27  --  25 25 26   GLUCOSE 90  --  85 91 85  BUN 21*  --  18 22* 25*  CREATININE 1.02  --  1.01 1.21 1.16  CALCIUM 8.7*  --  8.4* 8.3* 8.3*  MG  --  2.0  --   --   --    GFR: Estimated Creatinine Clearance: 109.6 mL/min (by C-G formula based on Cr of 1.16). Liver Function Tests:  Recent Labs Lab 12/21/15 1235  AST 24  ALT 21  ALKPHOS 69  BILITOT 1.9*  PROT 6.9  ALBUMIN 3.8   No results for input(s): LIPASE, AMYLASE in the last 168 hours. No results for input(s):  AMMONIA in the last 168 hours. Coagulation Profile: No results for input(s): INR, PROTIME in the last 168 hours. Cardiac Enzymes:  Recent Labs Lab 12/21/15 1235 12/21/15 1656 12/21/15 2331 12/22/15 0551  TROPONINI 0.04* 0.03* 0.04* 0.03*   BNP (last 3 results) No results for input(s): PROBNP in the last 8760 hours. HbA1C: No results for input(s): HGBA1C in the last 72 hours. CBG: No results for input(s): GLUCAP in the last 168 hours. Lipid Profile: No results for input(s): CHOL, HDL, LDLCALC, TRIG, CHOLHDL, LDLDIRECT in the last 72 hours. Thyroid Function Tests: No results for input(s): TSH, T4TOTAL, FREET4, T3FREE, THYROIDAB in the last 72 hours. Anemia Panel: No results for input(s): VITAMINB12, FOLATE, FERRITIN, TIBC, IRON, RETICCTPCT in the last 72 hours. Urine analysis:    Component Value Date/Time   COLORURINE YELLOW 10/29/2015 1012   APPEARANCEUR CLEAR 10/29/2015 1012   LABSPEC 1.019 10/29/2015 1012   PHURINE 6.0 10/29/2015 1012   GLUCOSEU NEGATIVE 10/29/2015 1012   GLUCOSEU NEG mg/dL 01/41/0301 3143   HGBUR NEGATIVE 10/29/2015 1012   BILIRUBINUR NEGATIVE 10/29/2015 1012   KETONESUR NEGATIVE 10/29/2015 1012   PROTEINUR 30* 10/29/2015 1012   UROBILINOGEN 0.2 08/08/2014 1445   NITRITE NEGATIVE 10/29/2015 1012   LEUKOCYTESUR NEGATIVE 10/29/2015 1012   Sepsis Labs: @LABRCNTIP (procalcitonin:4,lacticidven:4)  )No results found for this or any previous visit (from the past 240 hour(s)).       Radiology Studies: No results found.      Scheduled Meds: . budesonide (PULMICORT) nebulizer solution  0.25 mg Nebulization BID  . ferrous sulfate  325 mg Oral BID WC  . furosemide  80 mg Intravenous Q12H  . lisinopril  5 mg Oral Daily  . metoprolol succinate  100 mg Oral BID  . nitroGLYCERIN  1 inch Topical Q8H  . potassium chloride SA  20 mEq Oral BID  . potassium chloride  40 mEq Oral Q4H  . rivaroxaban  20 mg Oral QPM  . sodium chloride flush  3 mL  Intravenous Q12H   Continuous Infusions:    LOS: 2 days    Time spent: 25 minutes. Greater than 50% of this time was spent in direct contact with the patient coordinating care.     Chaya Jan, MD [facility] Pager [phone]  If 7PM-7AM, please contact night-coverage [email or web address] Password [email or web address] 12/24/2015, 11:11 AM

## 2015-12-24 NOTE — Care Management Note (Signed)
Case Management Note  Patient Details  Name: [name] MRN: [id number] Date of Birth: [birth year]  Subjective/Objective:        Pt admitted with CHF. Pt is from home, lives with mother and is active with [place] [facility] nursing services. Pt also active with [facility] [place]. Pt plans to return home with resumption of HH services. Tim [name], is aware of admission .  Action/Plan: Anticipate patient will d/c home with self care. Will follow.   Expected Discharge Date:        12/24/2015          Expected Discharge Plan:     In-House Referral:     Discharge planning Services     Post Acute Care Choice:    Choice offered to:     DME Arranged:    DME Agency:     HH Arranged:    HH Agency:     Status of Service:     If discussed at [employer] [facility] [employer], dates discussed:    Additional Comments:  Melessa Cowell, [name], RN 12/24/2015, 9:54 AM

## 2015-12-25 DIAGNOSIS — R7989 Other specified abnormal findings of blood chemistry: Secondary | ICD-10-CM

## 2015-12-25 DIAGNOSIS — I482 Chronic atrial fibrillation: Secondary | ICD-10-CM

## 2015-12-25 DIAGNOSIS — I5043 Acute on chronic combined systolic (congestive) and diastolic (congestive) heart failure: Secondary | ICD-10-CM

## 2015-12-25 MED ORDER — LISINOPRIL 5 MG PO TABS: 5.0000 mg | ORAL_TABLET | Freq: Every day | ORAL | Status: DC

## 2015-12-25 NOTE — Discharge Summary (Signed)
Physician Discharge Summary  [name] MWU:[id number] DOB: [birth year] DOA: 12/21/2015  PCP: Samuel Grippe, MD  Admit date: 12/21/2015 Discharge date: 12/25/2015  Time spent: 35 minutes  Recommendations for Outpatient Follow-up:  1. Follow up with PCP at [name].   Discharge Diagnoses:  Principal Problem:   Acute on chronic combined systolic (congestive) and diastolic (congestive) heart failure (HCC) Active Problems:   Essential hypertension   Asthma   Chronic atrial fibrillation (HCC)   Normocytic anemia   Noncompliance with diet and medication regimen   Acute on chronic combined systolic and diastolic CHF (congestive heart failure) (HCC)   Elevated troponin   Discharge Condition: improved.  Diet recommendation: cardiac healthy.  2 g NaCL.   Filed Weights   12/23/15 0500 12/24/15 0613 12/25/15 0704  Weight: 133.993 kg (295 lb 6.4 oz) 129.502 kg (285 lb 8 oz) 124.966 kg (275 lb 8 oz)    History of present illness: Patient was admitted by [name] for SOB and leg swelling on December 21, 2015.  As per his H and P:  " [name] is a 50 y.o. male with medical history significant for chronic combined systolic and diastolic CHF, atrial fibrillation, hypertension, asthma, and nonadherence to his treatment plan who presents the emergency department with progressive bilateral lower extremity edema and dyspnea with minimal exertion. Patient was discharged from the [facility] approximately 2 weeks ago after management for acute CHF exacerbation. He was diuresed at that time for a net negative of 8.2 L and was discharged in much improved and stable condition weighing 274 pounds. Over the ensuing 2 weeks, despite his reports of adherence to his treatment plan, he has regained 25 pounds and become symptomatic with orthopnea, bilateral [name] edema, and exertional dyspnea. Patient is followed by outpatient cardiology, but per the recent notes he is being discharged from the practice due to  consistent nonadherence to the treatment plan. Patient denies chest pain or palpitations, and denies fevers or chills. He denies nausea, vomiting, diarrhea, melena, or hematochezia. He reports continued adherence to his medications and denies any recent change in diet or fluid intake.  ED Course: Upon arrival to the ED, patient is found to be afebrile, saturating well on room air, tachycardic in the low 100s, and with blood pressure elevated to 175/105. EKG demonstrates an atrial fibrillation and chest x-ray is consistent with mild CHF. CMP is notable for total bilirubin of 1.9 which is improved from priors. CBC is notable for a hemoglobin of 11.3 which appears to be stable relative to recent prior measurements. BNP is elevated to a value of 1046 and troponin is mildly elevated to 0.04. Lasix 80 mg IV push was administered in the emergency department and the patient is begun diuresing appropriately. Patient has put out close to 2 L in the emergency department and reports some subjective improvement in his breathing. He'll be observed on the telemetry unit for ongoing evaluation and management of dyspnea, weight gain, and bilateral lower extremity edema suspected secondary to acute on chronic combined systolic and diastolic CHF.   [facility] Course: Patient was well known to the hospitalist service and cardiology service, as he has numerous admission for CHF, felt to be due to extreme non compliance.  He was seen again and had diuresis.  He felt better quickly not unsimilar to his prior admissions.   He was again emphasized on being compliant.  He felt better and wanted to go home.  He refused his Lisinopril, and  will be continued on his beta blocker.  His K was a little low, but he did not want to take pills for it.  He will replete it with high K food.  He had gone to [name] office, and like it there, so he will return there for follow up.  He was terminated by the cardiology service for being non compliant.   He is clinically stable for discharge, and will be discharged home today.     Discharge Exam: Filed Vitals:   12/25/15 0704 12/25/15 1002  BP: 126/94 138/92  Pulse: 48 87  Temp: 98.4 F (36.9 C)   Resp: 20      Discharge Instructions    Diet - low sodium heart healthy    Complete by:  As directed      Discharge instructions    Complete by:  As directed   Follow up with PCP in one week.     Increase activity slowly    Complete by:  As directed           Current Discharge Medication List    START taking these medications   Details  lisinopril (PRINIVIL,ZESTRIL) 5 MG tablet Take 1 tablet (5 mg total) by mouth daily. Qty: 30 tablet, Refills: 1      CONTINUE these medications which have NOT CHANGED   Details  atorvastatin (LIPITOR) 80 MG tablet Take 80 mg by mouth daily.    beclomethasone (QVAR) 80 MCG/ACT inhaler Inhale 2 puffs into the lungs daily as needed (shortness of breath). Qty: 1 Inhaler, Refills: 12    furosemide (LASIX) 80 MG tablet Take 1 tablet (80 mg total) by mouth 2 (two) times daily. Qty: 180 tablet, Refills: 1    metoprolol succinate (TOPROL-XL) 100 MG 24 hr tablet Take 1 tablet (100 mg total) by mouth 2 (two) times daily. Take with or immediately following a meal. Qty: 60 tablet, Refills: 6    potassium chloride SA (K-DUR,KLOR-CON) 20 MEQ tablet Take 1 tablet (20 mEq total) by mouth 2 (two) times daily. Qty: 20 tablet, Refills: 0    rivaroxaban (XARELTO) 20 MG TABS tablet Take 1 tablet (20 mg total) by mouth every evening. Qty: 30 tablet, Refills: 2    sacubitril-valsartan (ENTRESTO) 24-26 MG Take 1 tablet by mouth 2 (two) times daily. Qty: 60 tablet, Refills: 1       Allergies  Allergen Reactions  . Bee Venom Shortness Of Breath and Swelling  . Lipitor [Atorvastatin] Shortness Of Breath and Other (See Comments)    Nose bleed  . Aspirin Other (See Comments)    Chewable 81 mg tablets upsets patients stomach  . Banana Diarrhea  .  Diltiazem Other (See Comments)    Makes heart beat fast  . Hydralazine Other (See Comments)    Patient states it causes Tachycardia      The results of significant diagnostics from this hospitalization (including imaging, microbiology, ancillary and laboratory) are listed below for reference.    Significant Diagnostic Studies: Dg Chest 2 View  12/21/2015  CLINICAL DATA:  Shortness of Breath EXAM: CHEST  2 VIEW COMPARISON:  12/15/2015 FINDINGS: Cardiac shadow is enlarged. Mild vascular congestion is noted. No focal infiltrate or sizable effusion is seen. IMPRESSION: Mild CHF. Electronically Signed   By: Alcide Clever M.D.   On: 12/21/2015 12:56   Dg Chest 2 View  12/15/2015  CLINICAL DATA:  Acute onset of tachycardia and palpitations. Mid to left-sided chest tightness and productive cough. Bilateral leg swelling  and shortness of breath. Initial encounter. EXAM: CHEST  2 VIEW COMPARISON:  Chest radiograph from 12/02/2015 FINDINGS: The lungs are well-aerated. Vascular congestion is noted. Increased interstitial markings raise concern for mild pulmonary edema. There is no evidence of pleural effusion or pneumothorax. The heart is borderline enlarged. No acute osseous abnormalities are seen. IMPRESSION: Vascular congestion and borderline cardiomegaly. Increased interstitial markings raise concern for mild pulmonary edema. Electronically Signed   By: Roanna Raider M.D.   On: 12/15/2015 01:45   Dg Chest 2 View  12/02/2015  CLINICAL DATA:  Increasing shortness of breath and lower extremity swelling over 1 day. Previous smoker. History of CHF and asthma. EXAM: CHEST  2 VIEW COMPARISON:  11/25/2015. FINDINGS: Cardiac enlargement. Suggestion of mild central vascular congestion. Mild perihilar edema. Changes appear to be improving since previous study. No blunting of costophrenic angles. No pneumothorax. Mediastinal contours appear intact. IMPRESSION: Improving congestive changes since previous study.  Decreasing perihilar edema. Electronically Signed   By: Burman Nieves M.D.   On: 12/02/2015 23:43   Dg Chest Portable 1 View  12/15/2015  CLINICAL DATA:  Shortness of breath.  Chest pain. EXAM: PORTABLE CHEST 1 VIEW COMPARISON:  December 15, 2015 FINDINGS: Cardiomegaly and pulmonary venous congestion with no overt edema, nodule, or mass. The hila and mediastinum are unchanged. IMPRESSION: Cardiomegaly and pulmonary venous congestion. Electronically Signed   By: Gerome Sam III M.D   On: 12/15/2015 20:43   Labs: Basic Metabolic Panel:  Recent Labs Lab 12/21/15 1235 12/21/15 1656 12/22/15 0551 12/23/15 0504 12/24/15 0455  NA 139  --  140 140 140  K 3.5  --  3.4* 3.2* 3.0*  CL 107  --  109 106 106  CO2 27  --  25 25 26   GLUCOSE 90  --  85 91 85  BUN 21*  --  18 22* 25*  CREATININE 1.02  --  1.01 1.21 1.16  CALCIUM 8.7*  --  8.4* 8.3* 8.3*  MG  --  2.0  --   --  1.8   Liver Function Tests:  Recent Labs Lab 12/21/15 1235  AST 24  ALT 21  ALKPHOS 69  BILITOT 1.9*  PROT 6.9  ALBUMIN 3.8    Recent Labs Lab 12/21/15 1235  WBC 4.5  NEUTROABS 3.0  HGB 11.3*  HCT 35.4*  MCV 84.9  PLT 196   Cardiac Enzymes:  Recent Labs Lab 12/21/15 1235 12/21/15 1656 12/21/15 2331 12/22/15 0551  TROPONINI 0.04* 0.03* 0.04* 0.03*   BNP: BNP (last 3 results)  Recent Labs  12/14/15 2330 12/15/15 2022 12/21/15 1235  BNP 1012.0* 757.0* 1046.0*    Signed:  Terrica Duecker MD. Jerrel Ivory. [facility] 12/25/2015, 1:52 PM

## 2015-12-25 NOTE — Progress Notes (Signed)
Discharge instructions given on medications,and follow up visits,patient verbalized understanding. Vital signs stable.No c/o pain or discomfort noted. Accompanied by staff to an awaiting vehicle.. 

## 2015-12-25 NOTE — Care Management Important Message (Signed)
Important Message  Patient Details  Name: [name] MRN: [id number] Date of Birth: [birth year]   Medicare Important Message Given:  Yes    Demondre Aguas, [name], RN 12/25/2015, 9:00 AM

## 2016-01-02 ENCOUNTER — Emergency Department (HOSPITAL_COMMUNITY)
Admission: EM | Admit: 2016-01-02 | Discharge: 2016-01-03 | Disposition: A | Discharge status: Home / Self Care | Payer: Medicare Other | Source: Home / Self Care | Attending: Emergency Medicine | Admitting: Emergency Medicine

## 2016-01-02 ENCOUNTER — Encounter (HOSPITAL_COMMUNITY): Payer: Self-pay | Admitting: *Deleted

## 2016-01-02 ENCOUNTER — Emergency Department (HOSPITAL_COMMUNITY): Payer: Medicare Other

## 2016-01-02 ENCOUNTER — Emergency Department (HOSPITAL_COMMUNITY)
Admission: EM | Admit: 2016-01-02 | Discharge: 2016-01-02 | Disposition: A | Discharge status: Home / Self Care | Payer: Medicare Other | Source: Home / Self Care | Attending: Emergency Medicine | Admitting: Emergency Medicine

## 2016-01-02 DIAGNOSIS — I110 Hypertensive heart disease with heart failure: Secondary | ICD-10-CM | POA: Diagnosis not present

## 2016-01-02 DIAGNOSIS — R600 Localized edema: Secondary | ICD-10-CM

## 2016-01-02 DIAGNOSIS — Z7901 Long term (current) use of anticoagulants: Secondary | ICD-10-CM | POA: Insufficient documentation

## 2016-01-02 DIAGNOSIS — Z79899 Other long term (current) drug therapy: Secondary | ICD-10-CM | POA: Insufficient documentation

## 2016-01-02 DIAGNOSIS — I5043 Acute on chronic combined systolic (congestive) and diastolic (congestive) heart failure: Secondary | ICD-10-CM

## 2016-01-02 DIAGNOSIS — Z87891 Personal history of nicotine dependence: Secondary | ICD-10-CM

## 2016-01-02 DIAGNOSIS — I482 Chronic atrial fibrillation, unspecified: Secondary | ICD-10-CM

## 2016-01-02 DIAGNOSIS — R609 Edema, unspecified: Secondary | ICD-10-CM

## 2016-01-02 DIAGNOSIS — J45909 Unspecified asthma, uncomplicated: Secondary | ICD-10-CM

## 2016-01-02 DIAGNOSIS — R0602 Shortness of breath: Secondary | ICD-10-CM

## 2016-01-02 DIAGNOSIS — M1990 Unspecified osteoarthritis, unspecified site: Secondary | ICD-10-CM

## 2016-01-02 DIAGNOSIS — M7989 Other specified soft tissue disorders: Secondary | ICD-10-CM | POA: Diagnosis not present

## 2016-01-02 DIAGNOSIS — I10 Essential (primary) hypertension: Secondary | ICD-10-CM

## 2016-01-02 LAB — COMPREHENSIVE METABOLIC PANEL
ALBUMIN: 3.6 g/dL (ref 3.5–5.0)
ALK PHOS: 75 U/L (ref 38–126)
ALT: 24 U/L (ref 17–63)
ANION GAP: 6 (ref 5–15)
AST: 27 U/L (ref 15–41)
BILIRUBIN TOTAL: 1.3 mg/dL — AB (ref 0.3–1.2)
BUN: 18 mg/dL (ref 6–20)
CALCIUM: 8.4 mg/dL — AB (ref 8.9–10.3)
CO2: 24 mmol/L (ref 22–32)
Chloride: 109 mmol/L (ref 101–111)
Creatinine, Ser: 1.02 mg/dL (ref 0.61–1.24)
GLUCOSE: 91 mg/dL (ref 65–99)
POTASSIUM: 3.7 mmol/L (ref 3.5–5.1)
Sodium: 139 mmol/L (ref 135–145)
TOTAL PROTEIN: 6.8 g/dL (ref 6.5–8.1)

## 2016-01-02 LAB — CBC
HEMATOCRIT: 33.6 % — AB (ref 39.0–52.0)
HEMATOCRIT: 35.9 % — AB (ref 39.0–52.0)
HEMOGLOBIN: 10.6 g/dL — AB (ref 13.0–17.0)
Hemoglobin: 11.2 g/dL — ABNORMAL LOW (ref 13.0–17.0)
MCH: 26.6 pg (ref 26.0–34.0)
MCH: 26.5 pg (ref 26.0–34.0)
MCHC: 31.5 g/dL (ref 30.0–36.0)
MCHC: 31.2 g/dL (ref 30.0–36.0)
MCV: 84.2 fL (ref 78.0–100.0)
MCV: 84.9 fL (ref 78.0–100.0)
PLATELETS: 281 10*3/uL (ref 150–400)
Platelets: 254 10*3/uL (ref 150–400)
RBC: 3.99 MIL/uL — ABNORMAL LOW (ref 4.22–5.81)
RBC: 4.23 MIL/uL (ref 4.22–5.81)
RDW: 15.4 % (ref 11.5–15.5)
RDW: 15.3 % (ref 11.5–15.5)
WBC: 3.7 10*3/uL — ABNORMAL LOW (ref 4.0–10.5)
WBC: 4.6 10*3/uL (ref 4.0–10.5)

## 2016-01-02 LAB — TROPONIN I: TROPONIN I: 0.03 ng/mL — AB (ref <0.03)

## 2016-01-02 LAB — PROTIME-INR
INR: 1.14 (ref 0.00–1.49)
PROTHROMBIN TIME: 14.8 s (ref 11.6–15.2)

## 2016-01-02 LAB — URINALYSIS, ROUTINE W REFLEX MICROSCOPIC
BILIRUBIN URINE: NEGATIVE
GLUCOSE, UA: NEGATIVE mg/dL
HGB URINE DIPSTICK: NEGATIVE
KETONES UR: NEGATIVE mg/dL
LEUKOCYTES UA: NEGATIVE
Nitrite: NEGATIVE
PH: 6 (ref 5.0–8.0)
Specific Gravity, Urine: 1.015 (ref 1.005–1.030)

## 2016-01-02 LAB — BASIC METABOLIC PANEL
Anion gap: 8 (ref 5–15)
BUN: 14 mg/dL (ref 6–20)
CHLORIDE: 104 mmol/L (ref 101–111)
CO2: 25 mmol/L (ref 22–32)
CREATININE: 1.12 mg/dL (ref 0.61–1.24)
Calcium: 8.5 mg/dL — ABNORMAL LOW (ref 8.9–10.3)
GFR calc Af Amer: >60 mL/min (ref >60)
GFR calc non Af Amer: >60 mL/min (ref >60)
Glucose, Bld: 100 mg/dL — ABNORMAL HIGH (ref 65–99)
POTASSIUM: 3.3 mmol/L — AB (ref 3.5–5.1)
Sodium: 137 mmol/L (ref 135–145)

## 2016-01-02 LAB — BRAIN NATRIURETIC PEPTIDE
B Natriuretic Peptide: 733 pg/mL — ABNORMAL HIGH (ref 0.0–100.0)
B Natriuretic Peptide: 592.4 pg/mL — ABNORMAL HIGH (ref 0.0–100.0)

## 2016-01-02 LAB — URINE MICROSCOPIC-ADD ON
Bacteria, UA: NONE SEEN
SQUAMOUS EPITHELIAL / LPF: NONE SEEN
WBC UA: NONE SEEN WBC/hpf (ref 0–5)

## 2016-01-02 LAB — I-STAT TROPONIN, ED: Troponin i, poc: 0.02 ng/mL (ref 0.00–0.08)

## 2016-01-02 LAB — MAGNESIUM: MAGNESIUM: 2.1 mg/dL (ref 1.7–2.4)

## 2016-01-02 MED ORDER — FUROSEMIDE 10 MG/ML IJ SOLN
80.0000 mg | Freq: Once | INTRAMUSCULAR | Status: AC
  Administered 2016-01-02: 80 mg via INTRAVENOUS
  Filled 2016-01-02: qty 8

## 2016-01-02 MED ORDER — FUROSEMIDE 10 MG/ML IJ SOLN
40.0000 mg | INTRAMUSCULAR | Status: AC
  Administered 2016-01-02: 40 mg via INTRAVENOUS
  Filled 2016-01-02: qty 4

## 2016-01-02 MED ORDER — METOPROLOL TARTRATE 5 MG/5ML IV SOLN
5.0000 mg | Freq: Once | INTRAVENOUS | Status: AC
  Administered 2016-01-02: 5 mg via INTRAVENOUS
  Filled 2016-01-02: qty 5

## 2016-01-02 MED ORDER — POTASSIUM CHLORIDE CRYS ER 20 MEQ PO TBCR: 20.0000 meq | EXTENDED_RELEASE_TABLET | Freq: Once | ORAL | Status: DC

## 2016-01-02 MED ORDER — POTASSIUM CHLORIDE CRYS ER 20 MEQ PO TBCR: 40.0000 meq | EXTENDED_RELEASE_TABLET | Freq: Once | ORAL | Status: DC

## 2016-01-02 NOTE — ED Notes (Signed)
History of CHF, states he was admitted 2 weeks ago for same and has been having swelling in his extremities

## 2016-01-02 NOTE — ED Provider Notes (Signed)
CSN: [id number]     Arrival date & time 01/02/16  1041 History  By signing my name below, I, Majel Homer, attest that this documentation has been prepared under the direction and in the presence of Jacalyn Lefevre, MD . Electronically Signed: Majel Homer, Scribe. 01/02/2016. 11:02 AM.   Chief Complaint  Patient presents with  . Leg Swelling   The history is provided by the patient. No language interpreter was used.   HPI Comments: [name] is a 50 y.o. male with PMHx of HTN and CHF, and a.fib (CHADVASC score of 2) who presents to the Emergency Department complaining of gradually worsening, leg swelling that began ~2 weeks ago. He notes associated difficulty breathing and subjective fever. Pt notes he has been taking his medicine and believes this is why his leg swelling has occurred. Pt states he was recently admitted 2 weeks ago for similar symptoms. Pt reports he followed up with his PCP recently in which he was prescribed Lasix. He denies hx of blood clots.   Past Medical History  Diagnosis Date  . Nonischemic cardiomyopathy (HCC)     LVEF 20-25%  . History of cardiac catheterization     Normal coronaries December 2016  . Essential hypertension   . Gastroesophageal reflux disease   . Osteoarthritis   . Peptic ulcer disease   . OSA (obstructive sleep apnea)   . Allergic rhinitis   . Atrial fibrillation (HCC)   . Asthma   . Noncompliance     Major problem leading to declining health and recurrent hospitalization   Past Surgical History  Procedure Laterality Date  . Cardiac catheterization N/A 06/14/2015    Procedure: Right/Left Heart Cath and Coronary Angiography;  Surgeon: Runell Gess, MD;  Location: [facility] INVASIVE CV LAB;  Service: Cardiovascular;  Laterality: N/A;   Family History  Problem Relation Age of Onset  . Colon cancer Neg Hx   . Inflammatory bowel disease Neg Hx   . Liver disease Neg Hx   . Stroke Father   . Heart attack Father   . Aneurysm Mother      Cerebral aneurysm  . Hypertension Sister    Social History  Substance Use Topics  . Smoking status: Former Smoker -- 0.50 packs/day for 20 years    Types: Cigarettes    Start date: 04/26/1988    Quit date: 06/23/2007  . Smokeless tobacco: Never Used     Comment: 1 ppd former smoker  . Alcohol Use: No     Comment: No etoh since 2009    Review of Systems  Constitutional: Positive for fever.  Respiratory: Positive for shortness of breath.   Cardiovascular: Positive for leg swelling.  All other systems reviewed and are negative.  Allergies  Bee venom; Lipitor; Aspirin; Banana; Diltiazem; and Hydralazine  Home Medications   Prior to Admission medications   Medication Sig Start Date End Date Taking? Authorizing Provider  atorvastatin (LIPITOR) 80 MG tablet Take 80 mg by mouth daily.   Yes Historical Provider, MD  beclomethasone (QVAR) 80 MCG/ACT inhaler Inhale 2 puffs into the lungs daily as needed (shortness of breath). 11/21/15  Yes Erick Blinks, MD  furosemide (LASIX) 80 MG tablet Take 1 tablet (80 mg total) by mouth 2 (two) times daily. 12/02/15  Yes Jodelle Gross, NP  lisinopril (PRINIVIL,ZESTRIL) 5 MG tablet Take 1 tablet (5 mg total) by mouth daily. 12/25/15  Yes Houston Siren, MD  metoprolol succinate (TOPROL-XL) 100 MG 24 hr tablet Take 1 tablet (  100 mg total) by mouth 2 (two) times daily. Take with or immediately following a meal. 12/16/15  Yes [name], MD  potassium chloride SA (K-DUR,KLOR-CON) 20 MEQ tablet Take 1 tablet (20 mEq total) by mouth 2 (two) times daily. 12/16/15  Yes [name], MD  rivaroxaban (XARELTO) 20 MG TABS tablet Take 1 tablet (20 mg total) by mouth every evening. 11/21/15  Yes Erick Blinks, MD  sacubitril-valsartan (ENTRESTO) 24-26 MG Take 1 tablet by mouth 2 (two) times daily. Patient not taking: Reported on 12/21/2015 11/27/15   Houston Siren, MD   BP 156/111 mmHg  Pulse 102  Temp(Src) 98.7 F (37.1 C) (Oral)  Resp 24  Ht 6' (1.829 m)  Wt 306  lb 9.6 oz (139.073 kg)  BMI 41.57 kg/m2  SpO2 99% Physical Exam  Constitutional: He is oriented to person, place, and time. He appears well-developed and well-nourished.  HENT:  Head: Normocephalic and atraumatic.  Eyes: Conjunctivae are normal. Pupils are equal, round, and reactive to light. Right eye exhibits no discharge. Left eye exhibits no discharge. No scleral icterus.  Neck: Normal range of motion. No JVD present. No tracheal deviation present.  Cardiovascular: An irregular rhythm present. Tachycardia present.   Pulmonary/Chest: Effort normal. No stridor.  Musculoskeletal: He exhibits edema.  4+ pitting edema to BLE  Neurological: He is alert and oriented to person, place, and time. Coordination normal.  Psychiatric: He has a normal mood and affect. His behavior is normal. Judgment and thought content normal.  Nursing note and vitals reviewed.   ED Course  Procedures  DIAGNOSTIC STUDIES:  Oxygen Saturation is 99% on RA, normal by my interpretation.    COORDINATION OF CARE:  10:59 AM Discussed treatment plan, which includes blood work and IV Lasix with pt at bedside and pt agreed to plan.  Labs Review Labs Reviewed  CBC - Abnormal; Notable for the following:    WBC 3.7 (*)    RBC 3.99 (*)    Hemoglobin 10.6 (*)    HCT 33.6 (*)    All other components within normal limits  COMPREHENSIVE METABOLIC PANEL - Abnormal; Notable for the following:    Calcium 8.4 (*)    Total Bilirubin 1.3 (*)    All other components within normal limits  TROPONIN I - Abnormal; Notable for the following:    Troponin I 0.03 (*)    All other components within normal limits  URINALYSIS, ROUTINE W REFLEX MICROSCOPIC (NOT AT [facility]) - Abnormal; Notable for the following:    Protein, ur TRACE (*)    All other components within normal limits  BRAIN NATRIURETIC PEPTIDE - Abnormal; Notable for the following:    B Natriuretic Peptide 733.0 (*)    All other components within normal limits  MAGNESIUM   PROTIME-INR  URINE MICROSCOPIC-ADD ON    Imaging Review Dg Chest 2 View  01/02/2016  CLINICAL DATA:  Shortness of breath and fluid retention. EXAM: CHEST  2 VIEW COMPARISON:  12/21/2015. FINDINGS: Low volumes. The cardio pericardial silhouette is enlarged. There is pulmonary vascular congestion without overt pulmonary edema. A component of interstitial edema at the bases is suspected. The visualized bony structures of the thorax are intact. Telemetry leads overlie the chest. IMPRESSION: Low volume film with cardiomegaly and interstitial pulmonary edema. Electronically Signed   By: Kennith Center M.D.   On: 01/02/2016 12:13   I have personally reviewed and evaluated these images and lab results as part of my medical decision-making.   EKG Interpretation  Date/Time:  Thursday January 02 2016 11:14:12 EDT Ventricular Rate:  109 PR Interval:    QRS Duration: 96 QT Interval:  371 QTC Calculation: 500 R Axis:   68 Text Interpretation:  Atrial fibrillation Borderline T wave abnormalities  Borderline prolonged QT interval Confirmed by Raini Tiley MD, Alysen Smylie ([id number])  on 01/02/2016 12:02:27 PM      MDM  Pt was dismissed from the [facility] office due to noncompliance, but pt said that he has a new cardiologist in [place].  Pt is instructed to take all his meds as directed by his doctors.  He is encouraged to eat healthy foods.  His symptoms today all seem chronic and he is not hypoxic.  No reason to admit at this time.  Final diagnoses:  Acute on chronic combined systolic and diastolic CHF (congestive heart failure) (HCC)  Chronic atrial fibrillation (HCC)  Peripheral edema  Essential hypertension      Jacalyn Lefevre, MD 01/02/16 1328

## 2016-01-02 NOTE — Discharge Instructions (Signed)

## 2016-01-02 NOTE — ED Notes (Signed)
Critical Troponin called to Dr Particia Nearing 11:58 AM

## 2016-01-02 NOTE — Discharge Instructions (Signed)
It is very important to take all meds as prescribed by your doctors. Atrial Fibrillation Atrial fibrillation is a type of heartbeat that is irregular or fast (rapid). If you have this condition, your heart keeps quivering in a weird (chaotic) way. This condition can make it so your heart cannot pump blood normally. Having this condition gives a person more risk for stroke, heart failure, and other heart problems. There are different types of atrial fibrillation. Talk with your doctor to learn about the type that you have. HOME CARE  Take over-the-counter and prescription medicines only as told by your doctor.  If your doctor prescribed a blood-thinning medicine, take it exactly as told. Taking too much of it can cause bleeding. If you do not take enough of it, you will not have the protection that you need against stroke and other problems.  Do not use any tobacco products. These include cigarettes, chewing tobacco, and e-cigarettes. If you need help quitting, ask your doctor.  If you have apnea (obstructive sleep apnea), manage it as told by your doctor.  Do not drink alcohol.  Do not drink beverages that have caffeine. These include coffee, soda, and tea.  Maintain a healthy weight. Do not use diet pills unless your doctor says they are safe for you. Diet pills may make heart problems worse.  Follow diet instructions as told by your doctor.  Exercise regularly as told by your doctor.  Keep all follow-up visits as told by your doctor. This is important. GET HELP IF:  You notice a change in the speed, rhythm, or strength of your heartbeat.  You are taking a blood-thinning medicine and you notice more bruising.  You get tired more easily when you move or exercise. GET HELP RIGHT AWAY IF:  You have pain in your chest or your belly (abdomen).  You have sweating or weakness.  You feel sick to your stomach (nauseous).  You notice blood in your throw up (vomit), poop (stool), or pee  (urine).  You are short of breath.  You suddenly have swollen feet and ankles.  You feel dizzy.  Your suddenly get weak or numb in your face, arms, or legs, especially if it happens on one side of your body.  You have trouble talking, trouble understanding, or both.  Your face or your eyelid droops on one side. These symptoms may be an emergency. Do not wait to see if the symptoms will go away. Get medical help right away. Call your local emergency services (911 in the [place] Do not drive yourself to the hospital.   This information is not intended to replace advice given to you by your health care provider. Make sure you discuss any questions you have with your health care provider.   Document Released: 03/17/2008 Document Revised: 02/27/2015 Document Reviewed: 10/03/2014 Elsevier Interactive Patient Education [employer]

## 2016-01-02 NOTE — ED Notes (Signed)
Pt states he was at [facility] and was short of breath and hi legs were swollen, states they gave him Lasix and he urinated 3 jugs. States he went home and had some water and ate a sandwich and was still short of breath and swelling to his legs.

## 2016-01-02 NOTE — ED Provider Notes (Signed)
CSN: [id number]     Arrival date & time 01/02/16  1909 History   First MD Initiated Contact with Patient 01/02/16 2045     Chief Complaint  Patient presents with  . Shortness of Breath  . Congestive Heart Failure    [name] is a 50 y.o. male with a history of CHF, hypertension and atrial fibrillation on Xarelto who presents to the emergency department complaining of shortness of breath and bilateral leg swelling that is worsened over the past 3 days. He reports he feels short of breath and he has been having some chest pain today 2. He was seen in the emergency department at [facility] earlier today and was discharged. He reports he went home a and drank water and took Lasix and reports his leg swelling has worsened. He reports he's been having chest pain. He reports "a little bit of chest pain" currently. He reports he's been compliant with Xarelto and Lasix. He tells me he does not think the Lasix pills help. He reports only IV Lasix seems to help him. Later, he tells me he just recently started back on lasix a couple days. He denies fevers, vomiting, diarrhea, abdominal pain, or rashes.  Patient is a 50 y.o. male presenting with shortness of breath and CHF. The history is provided by the patient. No language interpreter was used.  Shortness of Breath Associated symptoms: chest pain   Associated symptoms: no abdominal pain, no cough, no fever, no headaches, no neck pain, no rash, no sore throat, no vomiting and no wheezing   Congestive Heart Failure Associated symptoms include chest pain. Pertinent negatives include no abdominal pain, chills, congestion, coughing, fever, headaches, nausea, neck pain, rash, sore throat or vomiting.    Past Medical History  Diagnosis Date  . Nonischemic cardiomyopathy (HCC)     LVEF 20-25%  . History of cardiac catheterization     Normal coronaries December 2016  . Essential hypertension   . Gastroesophageal reflux disease   . Osteoarthritis   .  Peptic ulcer disease   . OSA (obstructive sleep apnea)   . Allergic rhinitis   . Atrial fibrillation (HCC)   . Asthma   . Noncompliance     Major problem leading to declining health and recurrent hospitalization   Past Surgical History  Procedure Laterality Date  . Cardiac catheterization N/A 06/14/2015    Procedure: Right/Left Heart Cath and Coronary Angiography;  Surgeon: Runell Gess, MD;  Location: [facility] INVASIVE CV LAB;  Service: Cardiovascular;  Laterality: N/A;   Family History  Problem Relation Age of Onset  . Colon cancer Neg Hx   . Inflammatory bowel disease Neg Hx   . Liver disease Neg Hx   . Stroke Father   . Heart attack Father   . Aneurysm Mother     Cerebral aneurysm  . Hypertension Sister    Social History  Substance Use Topics  . Smoking status: Former Smoker -- 0.50 packs/day for 20 years    Types: Cigarettes    Start date: 04/26/1988    Quit date: 06/23/2007  . Smokeless tobacco: Never Used     Comment: 1 ppd former smoker  . Alcohol Use: No     Comment: No etoh since 2009    Review of Systems  Constitutional: Negative for fever and chills.  HENT: Negative for congestion and sore throat.   Eyes: Negative for visual disturbance.  Respiratory: Positive for shortness of breath. Negative for cough and wheezing.  Cardiovascular: Positive for chest pain and leg swelling. Negative for palpitations.  Gastrointestinal: Negative for nausea, vomiting, abdominal pain and diarrhea.  Genitourinary: Negative for dysuria.  Musculoskeletal: Negative for back pain and neck pain.  Skin: Negative for rash.  Neurological: Negative for syncope and headaches.      Allergies  Bee venom; Lipitor; Aspirin; Banana; Diltiazem; and Hydralazine  Home Medications   Prior to Admission medications   Medication Sig Start Date End Date Taking? Authorizing Provider  atorvastatin (LIPITOR) 80 MG tablet Take 80 mg by mouth daily.   Yes Historical Provider, MD  beclomethasone  (QVAR) 80 MCG/ACT inhaler Inhale 2 puffs into the lungs daily as needed (shortness of breath). 11/21/15  Yes Erick Blinks, MD  furosemide (LASIX) 80 MG tablet Take 1 tablet (80 mg total) by mouth 2 (two) times daily. 12/02/15  Yes Jodelle Gross, NP  lisinopril (PRINIVIL,ZESTRIL) 5 MG tablet Take 1 tablet (5 mg total) by mouth daily. 12/25/15  Yes Houston Siren, MD  metoprolol succinate (TOPROL-XL) 100 MG 24 hr tablet Take 1 tablet (100 mg total) by mouth 2 (two) times daily. Take with or immediately following a meal. 12/16/15  Yes [name], MD  potassium chloride SA (K-DUR,KLOR-CON) 20 MEQ tablet Take 1 tablet (20 mEq total) by mouth 2 (two) times daily. 12/16/15  Yes [name], MD  rivaroxaban (XARELTO) 20 MG TABS tablet Take 1 tablet (20 mg total) by mouth every evening. 11/21/15  Yes Erick Blinks, MD  sacubitril-valsartan (ENTRESTO) 24-26 MG Take 1 tablet by mouth 2 (two) times daily. Patient not taking: Reported on 12/21/2015 11/27/15   Houston Siren, MD   BP 156/128 mmHg  Pulse 56  Temp(Src) 98.1 F (36.7 C) (Oral)  Resp 18  Wt 136.079 kg  SpO2 99% Physical Exam  Constitutional: He appears well-developed and well-nourished. No distress.  Nontoxic appearing. Obese male.  HENT:  Head: Normocephalic and atraumatic.  Mouth/Throat: Oropharynx is clear and moist.  Eyes: Conjunctivae are normal. Pupils are equal, round, and reactive to light. Right eye exhibits no discharge. Left eye exhibits no discharge.  Neck: Neck supple.  Cardiovascular: Normal heart sounds and intact distal pulses.  Exam reveals no gallop and no friction rub.   No murmur heard. Irregularly irregular rhythm. Heart rate 104-112 on monitor. Good capillary refill.  Pulmonary/Chest: Effort normal and breath sounds normal. No respiratory distress. He has no wheezes. He has no rales.  No increased work of breathing. Lungs sound diminished in his bilateral bases.  Abdominal: Soft. There is no tenderness.  Musculoskeletal: He  exhibits edema.  Pitting edema to his bilateral legs.  Lymphadenopathy:    He has no cervical adenopathy.  Neurological: He is alert. Coordination normal.  Skin: Skin is warm and dry. No rash noted. He is not diaphoretic. No erythema. No pallor.  Psychiatric: He has a normal mood and affect. His behavior is normal.  Nursing note and vitals reviewed.   ED Course  Procedures (including critical care time) Labs Review Labs Reviewed  BASIC METABOLIC PANEL - Abnormal; Notable for the following:    Potassium 3.3 (*)    Glucose, Bld 100 (*)    Calcium 8.5 (*)    All other components within normal limits  CBC - Abnormal; Notable for the following:    Hemoglobin 11.2 (*)    HCT 35.9 (*)    All other components within normal limits  BRAIN NATRIURETIC PEPTIDE - Abnormal; Notable for the following:    B Natriuretic Peptide  592.4 (*)    All other components within normal limits  I-STAT TROPOININ, ED    Imaging Review Dg Chest 2 View  01/02/2016  CLINICAL DATA:  Shortness of breath for 2 weeks EXAM: CHEST  2 VIEW COMPARISON:  01/02/2016 FINDINGS: Mild bilateral interstitial thickening. There is no focal parenchymal opacity. There is no pleural effusion or pneumothorax. There is stable cardiomegaly. The osseous structures are unremarkable. IMPRESSION: Cardiomegaly with mild pulmonary vascular congestion. Electronically Signed   By: Elige Ko   On: 01/02/2016 21:24   Dg Chest 2 View  01/02/2016  CLINICAL DATA:  Shortness of breath and fluid retention. EXAM: CHEST  2 VIEW COMPARISON:  12/21/2015. FINDINGS: Low volumes. The cardio pericardial silhouette is enlarged. There is pulmonary vascular congestion without overt pulmonary edema. A component of interstitial edema at the bases is suspected. The visualized bony structures of the thorax are intact. Telemetry leads overlie the chest. IMPRESSION: Low volume film with cardiomegaly and interstitial pulmonary edema. Electronically Signed   By: Kennith Center M.D.   On: 01/02/2016 12:13   I have personally reviewed and evaluated these images and lab results as part of my medical decision-making.   EKG Interpretation   Date/Time:  Thursday January 02 2016 19:21:38 EDT Ventricular Rate:  104 PR Interval:    QRS Duration: 92 QT Interval:  368 QTC Calculation: 483 R Axis:   64 Text Interpretation:  Atrial fibrillation with rapid ventricular response  Nonspecific T wave abnormality Abnormal ECG Confirmed by DELO  MD, DOUGLAS  ([id number]) on 01/02/2016 11:36:34 PM      Filed Vitals:   01/02/16 2131 01/02/16 2215 01/02/16 2230 01/02/16 2345  BP: 145/118 153/117  156/128  Pulse: 56   56  Temp:      TempSrc:      Resp: 28 26  18   Weight:      SpO2: 99%  100% 99%     MDM   Meds given in ED:  Medications  furosemide (LASIX) injection 40 mg (40 mg Intravenous Given 01/02/16 2200)    New Prescriptions   No medications on file    Final diagnoses:  Acute on chronic combined systolic and diastolic congestive heart failure (HCC)  Peripheral edema  Chronic atrial fibrillation (HCC)   This  is a 50 y.o. male with a history of CHF, hypertension and atrial fibrillation on Xarelto who presents to the emergency department complaining of shortness of breath and bilateral leg swelling that is worsened over the past 3 days. He reports he feels short of breath and he has been having some chest pain today 2. He was seen in the emergency department at [facility] earlier today and was discharged. He reports he went home a and drank water and took Lasix and reports his leg swelling has worsened. He reports he's been having chest pain. He reports "a little bit of chest pain" currently. He reports he's been compliant with Xarelto and Lasix. He tells me he does not think the Lasix pills help. He reports only IV Lasix seems to help him. Later, he tells me he just recently started back on lasix a couple days. On exam the patient is afebrile nontoxic appearing.  He has no increased work of breathing. Oxygen saturations 100% on room air. He has bilateral pitting lower extremity edema. He is in A. fib. Patient has chronic atrial fibrillation. He is not in A. fib RVR. Patient's BMP and CBC are unremarkable and stable. BNP is 592 an  improvement from his lab test earlier today. Troponin is 0.02. He had a troponin earlier today that was 0.04. Chest x-ray shows cardiomegaly with mild pulmonary vascular congestion. The patient is not hypoxic. He received 40 of IV Lasix here. He is improvement in his blood work. Patient needs to continue with Lasix and follow closely with primary care and cardiology. I'm also reassured that the patient had troponin and blood work earlier today and these are improved. I discussed her can specific return precautions. I advised the patient to follow-up with their primary care provider this week. I advised the patient to return to the emergency department with new or worsening symptoms or new concerns. The patient verbalized understanding and agreement with plan.   This patient was discussed with and evaluated by Dr. Judd Lien who agrees with assessment and plan.    Everlene Farrier, PA-C 01/03/16 0000  Geoffery Lyons, MD 01/03/16 1056

## 2016-01-03 NOTE — ED Notes (Signed)
Pt verbalized understanding of discharge instructions and follow-up care. NAD noted at time of discharge. Lung sounds clear.

## 2016-01-05 ENCOUNTER — Inpatient Hospital Stay (HOSPITAL_COMMUNITY)
Admission: EM | Admit: 2016-01-05 | Discharge: 2016-01-09 | DRG: 292 | Disposition: A | Discharge status: Home Health | Payer: Medicare Other | Attending: Internal Medicine | Admitting: Internal Medicine

## 2016-01-05 ENCOUNTER — Encounter (HOSPITAL_COMMUNITY): Payer: Self-pay | Admitting: *Deleted

## 2016-01-05 ENCOUNTER — Emergency Department (HOSPITAL_COMMUNITY): Payer: Medicare Other

## 2016-01-05 DIAGNOSIS — Z9111 Patient's noncompliance with dietary regimen: Secondary | ICD-10-CM | POA: Diagnosis not present

## 2016-01-05 DIAGNOSIS — Z91119 Patient's noncompliance with dietary regimen due to unspecified reason: Secondary | ICD-10-CM

## 2016-01-05 DIAGNOSIS — Z7901 Long term (current) use of anticoagulants: Secondary | ICD-10-CM

## 2016-01-05 DIAGNOSIS — I110 Hypertensive heart disease with heart failure: Principal | ICD-10-CM | POA: Diagnosis present

## 2016-01-05 DIAGNOSIS — I272 Other secondary pulmonary hypertension: Secondary | ICD-10-CM | POA: Diagnosis present

## 2016-01-05 DIAGNOSIS — Z91148 Patient's other noncompliance with medication regimen for other reason: Secondary | ICD-10-CM

## 2016-01-05 DIAGNOSIS — Z9114 Patient's other noncompliance with medication regimen: Secondary | ICD-10-CM

## 2016-01-05 DIAGNOSIS — I428 Other cardiomyopathies: Secondary | ICD-10-CM

## 2016-01-05 DIAGNOSIS — R7989 Other specified abnormal findings of blood chemistry: Secondary | ICD-10-CM | POA: Diagnosis not present

## 2016-01-05 DIAGNOSIS — I248 Other forms of acute ischemic heart disease: Secondary | ICD-10-CM | POA: Diagnosis present

## 2016-01-05 DIAGNOSIS — Z8249 Family history of ischemic heart disease and other diseases of the circulatory system: Secondary | ICD-10-CM

## 2016-01-05 DIAGNOSIS — Z87891 Personal history of nicotine dependence: Secondary | ICD-10-CM

## 2016-01-05 DIAGNOSIS — I482 Chronic atrial fibrillation, unspecified: Secondary | ICD-10-CM | POA: Diagnosis present

## 2016-01-05 DIAGNOSIS — I5043 Acute on chronic combined systolic (congestive) and diastolic (congestive) heart failure: Secondary | ICD-10-CM | POA: Diagnosis not present

## 2016-01-05 DIAGNOSIS — I429 Cardiomyopathy, unspecified: Secondary | ICD-10-CM | POA: Diagnosis present

## 2016-01-05 DIAGNOSIS — R778 Other specified abnormalities of plasma proteins: Secondary | ICD-10-CM | POA: Diagnosis present

## 2016-01-05 DIAGNOSIS — K219 Gastro-esophageal reflux disease without esophagitis: Secondary | ICD-10-CM | POA: Diagnosis present

## 2016-01-05 DIAGNOSIS — Z6839 Body mass index (BMI) 39.0-39.9, adult: Secondary | ICD-10-CM

## 2016-01-05 DIAGNOSIS — G4733 Obstructive sleep apnea (adult) (pediatric): Secondary | ICD-10-CM | POA: Diagnosis present

## 2016-01-05 DIAGNOSIS — I509 Heart failure, unspecified: Secondary | ICD-10-CM

## 2016-01-05 DIAGNOSIS — Z823 Family history of stroke: Secondary | ICD-10-CM

## 2016-01-05 DIAGNOSIS — J45909 Unspecified asthma, uncomplicated: Secondary | ICD-10-CM | POA: Diagnosis present

## 2016-01-05 LAB — URINALYSIS, ROUTINE W REFLEX MICROSCOPIC
Bilirubin Urine: NEGATIVE
Glucose, UA: NEGATIVE mg/dL
Hgb urine dipstick: NEGATIVE
KETONES UR: NEGATIVE mg/dL
LEUKOCYTES UA: NEGATIVE
Nitrite: NEGATIVE
PROTEIN: 30 mg/dL — AB
Specific Gravity, Urine: >1.03 — ABNORMAL HIGH (ref 1.005–1.030)
pH: 5.5 (ref 5.0–8.0)

## 2016-01-05 LAB — COMPREHENSIVE METABOLIC PANEL
ALT: 22 U/L (ref 17–63)
ANION GAP: 5 (ref 5–15)
AST: 24 U/L (ref 15–41)
Albumin: 3.5 g/dL (ref 3.5–5.0)
Alkaline Phosphatase: 70 U/L (ref 38–126)
BUN: 21 mg/dL — ABNORMAL HIGH (ref 6–20)
CHLORIDE: 108 mmol/L (ref 101–111)
CO2: 26 mmol/L (ref 22–32)
Calcium: 8.5 mg/dL — ABNORMAL LOW (ref 8.9–10.3)
Creatinine, Ser: 1.16 mg/dL (ref 0.61–1.24)
GFR calc non Af Amer: >60 mL/min (ref >60)
Glucose, Bld: 90 mg/dL (ref 65–99)
POTASSIUM: 3.4 mmol/L — AB (ref 3.5–5.1)
Sodium: 139 mmol/L (ref 135–145)
Total Bilirubin: 1.4 mg/dL — ABNORMAL HIGH (ref 0.3–1.2)
Total Protein: 6.6 g/dL (ref 6.5–8.1)

## 2016-01-05 LAB — URINE MICROSCOPIC-ADD ON

## 2016-01-05 LAB — CBC WITH DIFFERENTIAL/PLATELET
BASOS ABS: 0 10*3/uL (ref 0.0–0.1)
Basophils Relative: 0 %
EOS PCT: 4 %
Eosinophils Absolute: 0.1 10*3/uL (ref 0.0–0.7)
HEMATOCRIT: 32.5 % — AB (ref 39.0–52.0)
Hemoglobin: 10.3 g/dL — ABNORMAL LOW (ref 13.0–17.0)
LYMPHS ABS: 0.9 10*3/uL (ref 0.7–4.0)
LYMPHS PCT: 30 %
MCH: 26.5 pg (ref 26.0–34.0)
MCHC: 31.7 g/dL (ref 30.0–36.0)
MCV: 83.8 fL (ref 78.0–100.0)
MONO ABS: 0.3 10*3/uL (ref 0.1–1.0)
Monocytes Relative: 10 %
NEUTROS ABS: 1.7 10*3/uL (ref 1.7–7.7)
Neutrophils Relative %: 56 %
PLATELETS: 253 10*3/uL (ref 150–400)
RBC: 3.88 MIL/uL — ABNORMAL LOW (ref 4.22–5.81)
RDW: 15.5 % (ref 11.5–15.5)
WBC: 3 10*3/uL — ABNORMAL LOW (ref 4.0–10.5)

## 2016-01-05 LAB — TROPONIN I
TROPONIN I: 0.39 ng/mL — AB (ref <0.03)
TROPONIN I: 0.27 ng/mL — AB (ref <0.03)
TROPONIN I: 0.3 ng/mL — AB (ref <0.03)
Troponin I: 0.33 ng/mL (ref <0.03)

## 2016-01-05 LAB — RAPID URINE DRUG SCREEN, HOSP PERFORMED
Amphetamines: NOT DETECTED
BENZODIAZEPINES: NOT DETECTED
Barbiturates: NOT DETECTED
COCAINE: NOT DETECTED
Opiates: NOT DETECTED
Tetrahydrocannabinol: NOT DETECTED

## 2016-01-05 LAB — BRAIN NATRIURETIC PEPTIDE: B NATRIURETIC PEPTIDE 5: 714 pg/mL — AB (ref 0.0–100.0)

## 2016-01-05 LAB — LIPASE, BLOOD: LIPASE: 18 U/L (ref 11–51)

## 2016-01-05 MED ORDER — METHYLPREDNISOLONE SODIUM SUCC 125 MG IJ SOLR
INTRAMUSCULAR | Status: AC
  Filled 2016-01-05: qty 2

## 2016-01-05 MED ORDER — BUDESONIDE 0.5 MG/2ML IN SUSP
0.5000 mg | Freq: Two times a day (BID) | RESPIRATORY_TRACT | Status: DC
  Administered 2016-01-05 – 2016-01-09 (×9): 0.5 mg via RESPIRATORY_TRACT
  Filled 2016-01-05 (×9): qty 2

## 2016-01-05 MED ORDER — EPINEPHRINE 0.3 MG/0.3ML IJ SOAJ
INTRAMUSCULAR | Status: AC
  Filled 2016-01-05: qty 0.3

## 2016-01-05 MED ORDER — RIVAROXABAN 20 MG PO TABS
20.0000 mg | ORAL_TABLET | Freq: Every evening | ORAL | Status: DC
  Administered 2016-01-05 – 2016-01-08 (×4): 20 mg via ORAL
  Filled 2016-01-05 (×4): qty 1

## 2016-01-05 MED ORDER — POTASSIUM CHLORIDE CRYS ER 20 MEQ PO TBCR
20.0000 meq | EXTENDED_RELEASE_TABLET | Freq: Two times a day (BID) | ORAL | Status: DC
  Administered 2016-01-05 – 2016-01-08 (×7): 20 meq via ORAL
  Filled 2016-01-05 (×7): qty 1

## 2016-01-05 MED ORDER — ONDANSETRON HCL 4 MG/2ML IJ SOLN: 4.0000 mg | Freq: Four times a day (QID) | INTRAMUSCULAR | Status: DC | PRN

## 2016-01-05 MED ORDER — SODIUM CHLORIDE 0.9% FLUSH
3.0000 mL | INTRAVENOUS | Status: DC | PRN
  Administered 2016-01-09: 3 mL via INTRAVENOUS
  Filled 2016-01-05: qty 3

## 2016-01-05 MED ORDER — FUROSEMIDE 10 MG/ML IJ SOLN
80.0000 mg | Freq: Once | INTRAMUSCULAR | Status: AC
Start: 2016-01-05 — End: 2016-01-05
  Administered 2016-01-05: 80 mg via INTRAVENOUS
  Filled 2016-01-05: qty 8

## 2016-01-05 MED ORDER — LISINOPRIL 5 MG PO TABS
5.0000 mg | ORAL_TABLET | Freq: Every day | ORAL | Status: DC
  Administered 2016-01-05: 5 mg via ORAL
  Filled 2016-01-05 (×5): qty 1

## 2016-01-05 MED ORDER — SODIUM CHLORIDE 0.9 % IV SOLN: 250.0000 mL | INTRAVENOUS | Status: DC | PRN

## 2016-01-05 MED ORDER — ATORVASTATIN CALCIUM 40 MG PO TABS
80.0000 mg | ORAL_TABLET | Freq: Every day | ORAL | Status: DC
  Administered 2016-01-05 – 2016-01-08 (×4): 80 mg via ORAL
  Filled 2016-01-05 (×4): qty 2

## 2016-01-05 MED ORDER — IOPAMIDOL (ISOVUE-300) INJECTION 61%
100.0000 mL | Freq: Once | INTRAVENOUS | Status: AC | PRN
  Administered 2016-01-05: 100 mL via INTRAVENOUS

## 2016-01-05 MED ORDER — ACETAMINOPHEN 325 MG PO TABS: 650.0000 mg | ORAL_TABLET | ORAL | Status: DC | PRN

## 2016-01-05 MED ORDER — METOPROLOL SUCCINATE ER 50 MG PO TB24
100.0000 mg | ORAL_TABLET | Freq: Two times a day (BID) | ORAL | Status: DC
  Administered 2016-01-05 – 2016-01-09 (×9): 100 mg via ORAL
  Filled 2016-01-05 (×9): qty 2

## 2016-01-05 MED ORDER — BECLOMETHASONE DIPROPIONATE 80 MCG/ACT IN AERS: 2.0000 | INHALATION_SPRAY | Freq: Every day | RESPIRATORY_TRACT | Status: DC | PRN

## 2016-01-05 MED ORDER — FAMOTIDINE IN NACL 20-0.9 MG/50ML-% IV SOLN
INTRAVENOUS | Status: AC
  Filled 2016-01-05: qty 50

## 2016-01-05 MED ORDER — FUROSEMIDE 10 MG/ML IJ SOLN
80.0000 mg | Freq: Two times a day (BID) | INTRAMUSCULAR | Status: DC
  Administered 2016-01-05 – 2016-01-09 (×9): 80 mg via INTRAVENOUS
  Filled 2016-01-05 (×10): qty 8

## 2016-01-05 MED ORDER — SODIUM CHLORIDE 0.9% FLUSH
3.0000 mL | Freq: Two times a day (BID) | INTRAVENOUS | Status: DC
  Administered 2016-01-05 – 2016-01-09 (×8): 3 mL via INTRAVENOUS

## 2016-01-05 NOTE — ED Notes (Signed)
CRITICAL VALUE ALERT  Critical value received: Troponin 0.39  Date of notification:  01/05/16  Time of notification: 0335  Critical value read back:Yes.    Nurse who received alert:  Rudene Anda, RN  MD notified (1st page):  Polina  Time MD responded:  [phone]

## 2016-01-05 NOTE — ED Notes (Signed)
Pt c/o bilateral lower extremity swelling that started three days ago, pt also c/o left lower quad abd pain that started a hour ago,

## 2016-01-05 NOTE — H&P (Signed)
History and Physical    KROY SPRUNG ZOX:[id number] DOB: 25-Sep-1965 DOA: 01/05/2016  PCP: Pearson Grippe, MD  Patient coming from:  home  Chief Complaint: leg swelling  HPI: GEOFFREY HYNES is a 50 y.o. male with medical history significant of NICCM with EF 25%, medical noncompliance, OSA, HTN , cafib comes in with 6 months of swelling in his legs.  Pt says that every time he comes into the hospital he gets better but when hes at home the lasix he takes makes his legs swell more.  He denies any sob or fevers or cough.  He says he is taking his medications.  Pt denies any drug use.  Pt reports his pcp increased his lasix to  po bid last week and this has made him much worse.   Denies any chest pain.  Pt referred for admission for marginally positive trop and chf exacerbation.  Review of Systems: As per HPI otherwise 10 point review of systems negative.   Past Medical History  Diagnosis Date  . Nonischemic cardiomyopathy (HCC)     LVEF 20-25%  . History of cardiac catheterization     Normal coronaries December 2016  . Essential hypertension   . Gastroesophageal reflux disease   . Osteoarthritis   . Peptic ulcer disease   . OSA (obstructive sleep apnea)   . Allergic rhinitis   . Atrial fibrillation (HCC)   . Asthma   . Noncompliance     Major problem leading to declining health and recurrent hospitalization    Past Surgical History  Procedure Laterality Date  . Cardiac catheterization N/A 06/14/2015    Procedure: Right/Left Heart Cath and Coronary Angiography;  Surgeon: Runell Gess, MD;  Location: [facility] INVASIVE CV LAB;  Service: Cardiovascular;  Laterality: N/A;     reports that he quit smoking about 8 years ago. His smoking use included Cigarettes. He started smoking about 27 years ago. He has a 10 pack-year smoking history. He has never used smokeless tobacco. He reports that he does not drink alcohol or use illicit drugs.  Allergies  Allergen Reactions  . Bee Venom Shortness  Of Breath and Swelling  . Lipitor [Atorvastatin] Shortness Of Breath and Other (See Comments)    Nose bleed  . Aspirin Other (See Comments)    Chewable 81 mg tablets upsets patients stomach  . Banana Diarrhea  . Diltiazem Other (See Comments)    Makes heart beat fast  . Hydralazine Other (See Comments)    Patient states it causes Tachycardia    Family History  Problem Relation Age of Onset  . Colon cancer Neg Hx   . Inflammatory bowel disease Neg Hx   . Liver disease Neg Hx   . Stroke Father   . Heart attack Father   . Aneurysm Mother     Cerebral aneurysm  . Hypertension Sister     Prior to Admission medications   Medication Sig Start Date End Date Taking? Authorizing Provider  atorvastatin (LIPITOR) 80 MG tablet Take 80 mg by mouth daily.   Yes Historical Provider, MD  beclomethasone (QVAR) 80 MCG/ACT inhaler Inhale 2 puffs into the lungs daily as needed (shortness of breath). 11/21/15  Yes Erick Blinks, MD  furosemide (LASIX) 80 MG tablet Take 1 tablet (80 mg total) by mouth 2 (two) times daily. 12/02/15  Yes Jodelle Gross, NP  lisinopril (PRINIVIL,ZESTRIL) 5 MG tablet Take 1 tablet (5 mg total) by mouth daily. 12/25/15  Yes Houston Siren, MD  metoprolol  succinate (TOPROL-XL) 100 MG 24 hr tablet Take 1 tablet (100 mg total) by mouth 2 (two) times daily. Take with or immediately following a meal. 12/16/15  Yes [name], MD  potassium chloride SA (K-DUR,KLOR-CON) 20 MEQ tablet Take 1 tablet (20 mEq total) by mouth 2 (two) times daily. 12/16/15  Yes [name], MD  rivaroxaban (XARELTO) 20 MG TABS tablet Take 1 tablet (20 mg total) by mouth every evening. 11/21/15  Yes Erick Blinks, MD  sacubitril-valsartan (ENTRESTO) 24-26 MG Take 1 tablet by mouth 2 (two) times daily. 11/27/15  Yes Houston Siren, MD    Physical Exam: Filed Vitals:   01/05/16 0400 01/05/16 0500 01/05/16 0515 01/05/16 0530  BP: 154/122 167/125  172/94  Pulse: 61 66 93   Temp:      TempSrc:      Resp: 0 17  14 22   SpO2: 98% 100% 100%       Constitutional: NAD, calm, comfortable Filed Vitals:   01/05/16 0400 01/05/16 0500 01/05/16 0515 01/05/16 0530  BP: 154/122 167/125  172/94  Pulse: 61 66 93   Temp:      TempSrc:      Resp: 0 17 14 22   SpO2: 98% 100% 100%    Eyes: PERRL, lids and conjunctivae normal ENMT: Mucous membranes are moist. Posterior pharynx clear of any exudate or lesions.Normal dentition.  Neck: normal, supple, no masses, no thyromegaly Respiratory: clear to auscultation bilaterally, no wheezing, no crackles. Normal respiratory effort. No accessory muscle use.  Cardiovascular: Regular rate and rhythm, no murmurs / rubs / gallops. 4+ ble edema. 2+ pedal pulses. No carotid bruits.  Abdomen: no tenderness, no masses palpated. No hepatosplenomegaly. Bowel sounds positive.  Musculoskeletal: no clubbing / cyanosis. No joint deformity upper and lower extremities. Good ROM, no contractures. Normal muscle tone.  Skin: no rashes, lesions, ulcers. No induration Neurologic: CN 2-12 grossly intact. Sensation intact, DTR normal. Strength 5/5 in all 4.  Psychiatric: Normal judgment and insight. Alert and oriented x 3. Normal mood.    Labs on Admission: I have personally reviewed following labs and imaging studies  CBC:  Recent Labs Lab 01/02/16 1105 01/02/16 1924 01/05/16 0210  WBC 3.7* 4.6 3.0*  NEUTROABS  --   --  1.7  HGB 10.6* 11.2* 10.3*  HCT 33.6* 35.9* 32.5*  MCV 84.2 84.9 83.8  PLT 254 281 253   Basic Metabolic Panel:  Recent Labs Lab 01/02/16 1105 01/02/16 1924 01/05/16 0210  NA 139 137 139  K 3.7 3.3* 3.4*  CL 109 104 108  CO2 24 25 26   GLUCOSE 91 100* 90  BUN 18 14 21*  CREATININE 1.02 1.12 1.16  CALCIUM 8.4* 8.5* 8.5*  MG 2.1  --   --    GFR: Estimated Creatinine Clearance: 112.5 mL/min (by C-G formula based on Cr of 1.16). Liver Function Tests:  Recent Labs Lab 01/02/16 1105 01/05/16 0210  AST 27 24  ALT 24 22  ALKPHOS 75 70  BILITOT  1.3* 1.4*  PROT 6.8 6.6  ALBUMIN 3.6 3.5    Recent Labs Lab 01/05/16 0210  LIPASE 18   No results for input(s): AMMONIA in the last 168 hours. Coagulation Profile:  Recent Labs Lab 01/02/16 1105  INR 1.14   Cardiac Enzymes:  Recent Labs Lab 01/02/16 1105 01/05/16 0210  TROPONINI 0.03* 0.39*   BNP (last 3 results) No results for input(s): PROBNP in the last 8760 hours. HbA1C: No results for input(s): HGBA1C in the last 72  hours. CBG: No results for input(s): GLUCAP in the last 168 hours. Lipid Profile: No results for input(s): CHOL, HDL, LDLCALC, TRIG, CHOLHDL, LDLDIRECT in the last 72 hours. Thyroid Function Tests: No results for input(s): TSH, T4TOTAL, FREET4, T3FREE, THYROIDAB in the last 72 hours. Anemia Panel: No results for input(s): VITAMINB12, FOLATE, FERRITIN, TIBC, IRON, RETICCTPCT in the last 72 hours. Urine analysis:    Component Value Date/Time   COLORURINE YELLOW 01/05/2016 0327   APPEARANCEUR CLEAR 01/05/2016 0327   LABSPEC >1.030* 01/05/2016 0327   PHURINE 5.5 01/05/2016 0327   GLUCOSEU NEGATIVE 01/05/2016 0327   GLUCOSEU NEG mg/dL 27/08/5007 3818   HGBUR NEGATIVE 01/05/2016 0327   BILIRUBINUR NEGATIVE 01/05/2016 0327   KETONESUR NEGATIVE 01/05/2016 0327   PROTEINUR 30* 01/05/2016 0327   UROBILINOGEN 0.2 08/08/2014 1445   NITRITE NEGATIVE 01/05/2016 0327   LEUKOCYTESUR NEGATIVE 01/05/2016 0327   Sepsis Labs: !!!!!!!!!!!!!!!!!!!!!!!!!!!!!!!!!!!!!!!!!!!! @LABRCNTIP (procalcitonin:4,lacticidven:4) )No results found for this or any previous visit (from the past 240 hour(s)).   Radiological Exams on Admission: Dg Chest 2 View  01/05/2016  CLINICAL DATA:  50 year old male with edema and bilateral lower extremity swelling. EXAM: CHEST  2 VIEW COMPARISON:  Chest radiograph dated 01/02/2016 FINDINGS: There is stable mild cardiomegaly. There is mild pulmonary vascular congestion without edema. There is no focal consolidation, pleural effusion, or  pneumothorax. No acute osseous pathology. IMPRESSION: Stable cardiomegaly with mild congestive changes. No focal consolidation or edema. Electronically Signed   By: Elgie Collard M.D.   On: 01/05/2016 02:36   Ct Abdomen Pelvis W Contrast  01/05/2016  CLINICAL DATA:  Bilateral lower extremity swelling starting 3 days ago. Left lower quadrant pain. EXAM: CT ABDOMEN AND PELVIS WITH CONTRAST TECHNIQUE: Multidetector CT imaging of the abdomen and pelvis was performed using the standard protocol following bolus administration of intravenous contrast. CONTRAST:  ISOVUE-300 IOPAMIDOL (ISOVUE-300) INJECTION 61% COMPARISON:  None. FINDINGS: Lower chest: Patchy airspace disease in both lung bases suggesting probable edema. Heart size is enlarged. Residual contrast material in the esophagus may represent reflux or dysmotility. Hepatobiliary: No masses or other significant abnormality. Pancreas: No mass, inflammatory changes, or other significant abnormality. Spleen: Within normal limits in size and appearance. Adrenals/Urinary Tract: No masses identified. No evidence of hydronephrosis. Bilateral renal cysts. Stomach/Bowel: No evidence of obstruction, inflammatory process, or abnormal fluid collections. Vascular/Lymphatic: No pathologically enlarged lymph nodes. No evidence of abdominal aortic aneurysm. Reproductive: No mass or other significant abnormality. Other: None. Musculoskeletal: No suspicious bone lesions identified. Schmorl's node at T11. IMPRESSION: Cardiac enlargement with airspace disease in both lung bases suggesting heart failure with edema. No acute process demonstrated in the abdomen or pelvis. Electronically Signed   By: Burman Nieves M.D.   On: 01/05/2016 05:08    EKG: Independently reviewed.  afib  Assessment/Plan 50 yo male with acute on chronic systolic chf exacerbation  Active Problems:   Chronic atrial fibrillation (HCC)- rate controlled.  Cont bblocker   Pulmonary hypertension  (HCC)- noted   OSA (obstructive sleep apnea)- noted   Noncompliance with diet and medication regimen- suspect this is still an issue although he denies, will see how he responds with his medications in hospital   Nonischemic cardiomyopathy [facility])- noted   Acute on chronic combined systolic and diastolic CHF (congestive heart failure) (HCC)- iv lasix 80mg  iv bid.  chf pathway.    Elevated troponin- serial troponin   obs on tele.    DVT prophylaxis:  xaralto Code Status:  Full code  Charles Andringa A MD [facility]  If 7PM-7AM, please contact night-coverage [email or web address] Password [facility]  01/05/2016, 5:58 AM

## 2016-01-05 NOTE — Care Management Obs Status (Signed)
MEDICARE OBSERVATION STATUS NOTIFICATION   Patient Details  Name: [name] MRN: [id number] Date of Birth: [birth year]   Medicare Observation Status Notification Given:  Yes    Fuller Plan, RN 01/05/2016, 12:11 PM

## 2016-01-05 NOTE — Progress Notes (Signed)
Patient briefly seen and examined. Admitted earlier today for volume overload due to acute on chronic combined CHF. He is well-known to [place] for history of noncompliance with both diet and medications, which causes frequent readmissions to the hospital for congestive heart failure. Continue Lasix, strive for negative fluid balance. Is already close to 4 L negative.  Peggye Pitt, MD [facility] Pager: [phone]

## 2016-01-05 NOTE — ED Notes (Signed)
Patient returned from radiology

## 2016-01-06 DIAGNOSIS — K219 Gastro-esophageal reflux disease without esophagitis: Secondary | ICD-10-CM | POA: Diagnosis present

## 2016-01-06 DIAGNOSIS — I248 Other forms of acute ischemic heart disease: Secondary | ICD-10-CM | POA: Diagnosis present

## 2016-01-06 DIAGNOSIS — J45909 Unspecified asthma, uncomplicated: Secondary | ICD-10-CM | POA: Diagnosis present

## 2016-01-06 DIAGNOSIS — Z7901 Long term (current) use of anticoagulants: Secondary | ICD-10-CM | POA: Diagnosis not present

## 2016-01-06 DIAGNOSIS — Z6839 Body mass index (BMI) 39.0-39.9, adult: Secondary | ICD-10-CM | POA: Diagnosis not present

## 2016-01-06 DIAGNOSIS — Z9111 Patient's noncompliance with dietary regimen: Secondary | ICD-10-CM | POA: Diagnosis not present

## 2016-01-06 DIAGNOSIS — I5043 Acute on chronic combined systolic (congestive) and diastolic (congestive) heart failure: Secondary | ICD-10-CM | POA: Diagnosis present

## 2016-01-06 DIAGNOSIS — I110 Hypertensive heart disease with heart failure: Secondary | ICD-10-CM | POA: Diagnosis present

## 2016-01-06 DIAGNOSIS — M7989 Other specified soft tissue disorders: Secondary | ICD-10-CM | POA: Diagnosis present

## 2016-01-06 DIAGNOSIS — I482 Chronic atrial fibrillation: Secondary | ICD-10-CM | POA: Diagnosis present

## 2016-01-06 DIAGNOSIS — Z87891 Personal history of nicotine dependence: Secondary | ICD-10-CM | POA: Diagnosis not present

## 2016-01-06 DIAGNOSIS — Z823 Family history of stroke: Secondary | ICD-10-CM | POA: Diagnosis not present

## 2016-01-06 DIAGNOSIS — R7989 Other specified abnormal findings of blood chemistry: Secondary | ICD-10-CM | POA: Diagnosis not present

## 2016-01-06 DIAGNOSIS — I272 Other secondary pulmonary hypertension: Secondary | ICD-10-CM | POA: Diagnosis present

## 2016-01-06 DIAGNOSIS — Z9114 Patient's other noncompliance with medication regimen: Secondary | ICD-10-CM | POA: Diagnosis not present

## 2016-01-06 DIAGNOSIS — G4733 Obstructive sleep apnea (adult) (pediatric): Secondary | ICD-10-CM | POA: Diagnosis present

## 2016-01-06 DIAGNOSIS — Z8249 Family history of ischemic heart disease and other diseases of the circulatory system: Secondary | ICD-10-CM | POA: Diagnosis not present

## 2016-01-06 DIAGNOSIS — I429 Cardiomyopathy, unspecified: Secondary | ICD-10-CM | POA: Diagnosis present

## 2016-01-06 LAB — BASIC METABOLIC PANEL
Anion gap: 7 (ref 5–15)
BUN: 23 mg/dL — AB (ref 6–20)
CHLORIDE: 107 mmol/L (ref 101–111)
CO2: 29 mmol/L (ref 22–32)
CREATININE: 1.09 mg/dL (ref 0.61–1.24)
Calcium: 8.8 mg/dL — ABNORMAL LOW (ref 8.9–10.3)
GFR calc Af Amer: >60 mL/min (ref >60)
GFR calc non Af Amer: >60 mL/min (ref >60)
GLUCOSE: 87 mg/dL (ref 65–99)
POTASSIUM: 3.5 mmol/L (ref 3.5–5.1)
SODIUM: 143 mmol/L (ref 135–145)

## 2016-01-06 NOTE — ED Provider Notes (Signed)
CSN: [id number]     Arrival date & time 01/05/16  0116 History   First MD Initiated Contact with Patient 01/05/16 0121     Chief Complaint  Patient presents with  . Abdominal Pain     (Consider location/radiation/quality/duration/timing/severity/associated sxs/prior Treatment) HPI Comments: Patient presents to the emergency room with complaints of left sided abdominal pain as well as lower extremity swelling. Patient reports that the abdominal pain started approximately an hour before coming to the ER. He has not had fever, nausea, vomiting or diarrhea. Patient reports that he does have a history of congestive heart failure and has lower extremity edema, but over the last 3 days this has significantly worsened. He reports being hospitalized for similar findings recently and improving during the hospitalization. He reports that he has been taking his Lasix as prescribed, but does admit that he has not been taking all of his medications, including hypertension medications. He is not expressing any chest pain, has very slight shortness of breath.  Patient is a 50 y.o. male presenting with abdominal pain.  Abdominal Pain Associated symptoms: shortness of breath     Past Medical History  Diagnosis Date  . Nonischemic cardiomyopathy (HCC)     LVEF 20-25%  . History of cardiac catheterization     Normal coronaries December 2016  . Essential hypertension   . Gastroesophageal reflux disease   . Osteoarthritis   . Peptic ulcer disease   . OSA (obstructive sleep apnea)   . Allergic rhinitis   . Atrial fibrillation (HCC)   . Asthma   . Noncompliance     Major problem leading to declining health and recurrent hospitalization   Past Surgical History  Procedure Laterality Date  . Cardiac catheterization N/A 06/14/2015    Procedure: Right/Left Heart Cath and Coronary Angiography;  Surgeon: Runell Gess, MD;  Location: [facility] INVASIVE CV LAB;  Service: Cardiovascular;  Laterality: N/A;   Family  History  Problem Relation Age of Onset  . Colon cancer Neg Hx   . Inflammatory bowel disease Neg Hx   . Liver disease Neg Hx   . Stroke Father   . Heart attack Father   . Aneurysm Mother     Cerebral aneurysm  . Hypertension Sister    Social History  Substance Use Topics  . Smoking status: Former Smoker -- 0.50 packs/day for 20 years    Types: Cigarettes    Start date: 04/26/1988    Quit date: 06/23/2007  . Smokeless tobacco: Never Used     Comment: 1 ppd former smoker  . Alcohol Use: No     Comment: No etoh since 2009    Review of Systems  Respiratory: Positive for shortness of breath.   Cardiovascular: Positive for leg swelling.  Gastrointestinal: Positive for abdominal pain.  All other systems reviewed and are negative.     Allergies  Bee venom; Lipitor; Aspirin; Banana; Diltiazem; and Hydralazine  Home Medications   Prior to Admission medications   Medication Sig Start Date End Date Taking? Authorizing Provider  atorvastatin (LIPITOR) 80 MG tablet Take 80 mg by mouth daily.   Yes Historical Provider, MD  beclomethasone (QVAR) 80 MCG/ACT inhaler Inhale 2 puffs into the lungs daily as needed (shortness of breath). 11/21/15  Yes Erick Blinks, MD  furosemide (LASIX) 80 MG tablet Take 1 tablet (80 mg total) by mouth 2 (two) times daily. 12/02/15  Yes Jodelle Gross, NP  lisinopril (PRINIVIL,ZESTRIL) 5 MG tablet Take 1 tablet (5 mg total) by  mouth daily. 12/25/15  Yes Houston Siren, MD  metoprolol succinate (TOPROL-XL) 100 MG 24 hr tablet Take 1 tablet (100 mg total) by mouth 2 (two) times daily. Take with or immediately following a meal. 12/16/15  Yes [name], MD  potassium chloride SA (K-DUR,KLOR-CON) 20 MEQ tablet Take 1 tablet (20 mEq total) by mouth 2 (two) times daily. 12/16/15  Yes [name], MD  rivaroxaban (XARELTO) 20 MG TABS tablet Take 1 tablet (20 mg total) by mouth every evening. 11/21/15  Yes Erick Blinks, MD   BP 135/93 mmHg  Pulse 93  Temp(Src)  98 F (36.7 C) (Oral)  Resp 18  Ht 6' (1.829 m)  Wt 293 lb 9.6 oz (133.176 kg)  BMI 39.81 kg/m2  SpO2 98% Physical Exam  Constitutional: He is oriented to person, place, and time. He appears well-developed and well-nourished. No distress.  HENT:  Head: Normocephalic and atraumatic.  Right Ear: Hearing normal.  Left Ear: Hearing normal.  Nose: Nose normal.  Mouth/Throat: Oropharynx is clear and moist and mucous membranes are normal.  Eyes: Conjunctivae and EOM are normal. Pupils are equal, round, and reactive to light.  Neck: Normal range of motion. Neck supple.  Cardiovascular: S1 normal and S2 normal.  An irregularly irregular rhythm present. Exam reveals no gallop and no friction rub.   No murmur heard. Pulmonary/Chest: Effort normal and breath sounds normal. No respiratory distress. He exhibits no tenderness.  Abdominal: Soft. Normal appearance and bowel sounds are normal. There is no hepatosplenomegaly. There is tenderness (Diffuse left-sided). There is no rebound, no guarding, no tenderness at McBurney's point and negative Murphy's sign. No hernia.  Musculoskeletal: Normal range of motion. He exhibits edema.  Neurological: He is alert and oriented to person, place, and time. He has normal strength. No cranial nerve deficit or sensory deficit. Coordination normal. GCS eye subscore is 4. GCS verbal subscore is 5. GCS motor subscore is 6.  Skin: Skin is warm, dry and intact. No rash noted. No cyanosis.  Psychiatric: He has a normal mood and affect. His speech is normal and behavior is normal. Thought content normal.  Nursing note and vitals reviewed.   ED Course  Procedures (including critical care time) Labs Review Labs Reviewed  CBC WITH DIFFERENTIAL/PLATELET - Abnormal; Notable for the following:    WBC 3.0 (*)    RBC 3.88 (*)    Hemoglobin 10.3 (*)    HCT 32.5 (*)    All other components within normal limits  COMPREHENSIVE METABOLIC PANEL - Abnormal; Notable for the  following:    Potassium 3.4 (*)    BUN 21 (*)    Calcium 8.5 (*)    Total Bilirubin 1.4 (*)    All other components within normal limits  TROPONIN I - Abnormal; Notable for the following:    Troponin I 0.39 (*)    All other components within normal limits  BRAIN NATRIURETIC PEPTIDE - Abnormal; Notable for the following:    B Natriuretic Peptide 714.0 (*)    All other components within normal limits  URINALYSIS, ROUTINE W REFLEX MICROSCOPIC (NOT AT [facility]) - Abnormal; Notable for the following:    Specific Gravity, Urine >1.030 (*)    Protein, ur 30 (*)    All other components within normal limits  URINE MICROSCOPIC-ADD ON - Abnormal; Notable for the following:    Squamous Epithelial / LPF 0-5 (*)    Bacteria, UA FEW (*)    All other components within normal limits  TROPONIN  I - Abnormal; Notable for the following:    Troponin I 0.33 (*)    All other components within normal limits  TROPONIN I - Abnormal; Notable for the following:    Troponin I 0.27 (*)    All other components within normal limits  TROPONIN I - Abnormal; Notable for the following:    Troponin I 0.30 (*)    All other components within normal limits  LIPASE, BLOOD  URINE RAPID DRUG SCREEN, HOSP PERFORMED  BASIC METABOLIC PANEL    Imaging Review Dg Chest 2 View  01/05/2016  CLINICAL DATA:  50 year old male with edema and bilateral lower extremity swelling. EXAM: CHEST  2 VIEW COMPARISON:  Chest radiograph dated 01/02/2016 FINDINGS: There is stable mild cardiomegaly. There is mild pulmonary vascular congestion without edema. There is no focal consolidation, pleural effusion, or pneumothorax. No acute osseous pathology. IMPRESSION: Stable cardiomegaly with mild congestive changes. No focal consolidation or edema. Electronically Signed   By: Elgie Collard M.D.   On: 01/05/2016 02:36   Ct Abdomen Pelvis W Contrast  01/05/2016  CLINICAL DATA:  Bilateral lower extremity swelling starting 3 days ago. Left lower quadrant  pain. EXAM: CT ABDOMEN AND PELVIS WITH CONTRAST TECHNIQUE: Multidetector CT imaging of the abdomen and pelvis was performed using the standard protocol following bolus administration of intravenous contrast. CONTRAST:  ISOVUE-300 IOPAMIDOL (ISOVUE-300) INJECTION 61% COMPARISON:  None. FINDINGS: Lower chest: Patchy airspace disease in both lung bases suggesting probable edema. Heart size is enlarged. Residual contrast material in the esophagus may represent reflux or dysmotility. Hepatobiliary: No masses or other significant abnormality. Pancreas: No mass, inflammatory changes, or other significant abnormality. Spleen: Within normal limits in size and appearance. Adrenals/Urinary Tract: No masses identified. No evidence of hydronephrosis. Bilateral renal cysts. Stomach/Bowel: No evidence of obstruction, inflammatory process, or abnormal fluid collections. Vascular/Lymphatic: No pathologically enlarged lymph nodes. No evidence of abdominal aortic aneurysm. Reproductive: No mass or other significant abnormality. Other: None. Musculoskeletal: No suspicious bone lesions identified. Schmorl's node at T11. IMPRESSION: Cardiac enlargement with airspace disease in both lung bases suggesting heart failure with edema. No acute process demonstrated in the abdomen or pelvis. Electronically Signed   By: Burman Nieves M.D.   On: 01/05/2016 05:08   I have personally reviewed and evaluated these images and lab results as part of my medical decision-making.   EKG Interpretation   Date/Time:  Sunday January 05 2016 01:25:57 EDT Ventricular Rate:  93 PR Interval:    QRS Duration: 93 QT Interval:  367 QTC Calculation: 457 R Axis:   73 Text Interpretation:  Atrial fibrillation Ventricular premature complex  Baseline wander in lead(s) V1 No significant change since last tracing  Confirmed by POLLINA  MD, CHRISTOPHER ([id number]) on 01/05/2016 5:24:11 AM      MDM   Final diagnoses:  Acute on chronic combined systolic  and diastolic CHF (congestive heart failure) (HCC)   Patient presents to the emergency department for evaluation of multiple problems. Patient complaining of abdominal pain that began 1 hour before arrival. He did have diffuse left-sided tenderness but there were no peritoneal signs. Urinalysis was normal. He does not have a leukocytosis. Patient is afebrile. CT scan performed and does not show any acute abnormality to explain the pain.  Also complaining of lower extremity swelling. He has a known nonischemic cardiomyopathy and congestive heart failure. He has had cardiac catheterization has shown normal coronary arteries. He did, however, have elevated troponin of 0.39. Compared to his most recent visits,  at which time his troponins have been normal, this is an increase. Etiology is unclear, might be secondary to volume overload, as he does not have coronary artery disease. This will need to be cycled.  Patient is in atrial fibrillation. This appears chronic. His chads-vasc score is 2, but he is on anticoagulation with Xarelto. He is rate controlled.    Gilda Crease, MD 01/06/16 [phone]

## 2016-01-06 NOTE — Progress Notes (Signed)
PROGRESS NOTE    [name]  ZOX:[id number] DOB: July 14, 1965 DOA: 01/05/2016 PCP: Pearson Grippe, MD     Brief Narrative:  50 year old man admitted to the hospital on 7/16 for acute on chronic combined CHF. Well-known to [place] for frequent hospitalizations for the same due to medication and dietary indiscretion.   Assessment & Plan:   Active Problems:   Chronic atrial fibrillation (HCC)   Pulmonary hypertension (HCC)   OSA (obstructive sleep apnea)   Noncompliance with diet and medication regimen   Nonischemic cardiomyopathy (HCC)   Acute on chronic combined systolic and diastolic CHF (congestive heart failure) (HCC)   Elevated troponin   Acute on chronic combined CHF -Echo from April 2017 with ejection fraction of 20-25% and able to evaluate LV diastolic function. -Is 5.6 L negative since admission. -Continue current dose of Lasix 80 mg IV twice a day, his diureses adequately but is still markedly volume overloaded on exam.  Chronic atrial fibrillation -Rate controlled, anticoagulated on xarelto.  Elevated troponin -He denies chest pain, EKG without acute ischemic abnormalities. -Likely due to demand ischemia in the face of congestive heart failure with markedly decreased ejection fraction. -No cardiac workup planned for at present.   DVT prophylaxis: xarelto Code Status: Full code Family Communication: Patient only Disposition Plan: Anticipate at least 2-3 more days in the hospital  Consultants:   None  Procedures:   None  Antimicrobials:   None    Subjective: Feels well, as usual states that his Lasix is what causes his fluid gain and this is why he does not take it at home  Objective: Filed Vitals:   01/05/16 2106 01/06/16 0519 01/06/16 0813 01/06/16 1420  BP: 135/93 153/78  120/86  Pulse: 93 82  91  Temp: 98 F (36.7 C) 97.4 F (36.3 C)  97.8 F (36.6 C)  TempSrc: Oral Oral  Oral  Resp: Height:      Weight:  76.794 kg (169 lb 4.8 oz)      SpO2: 98% 100% 97% 99%    Intake/Output Summary (Last 24 hours) at 01/06/16 1651 Last data filed at 01/06/16 0529  Gross per 24 hour  Intake    240 ml  Output   1900 ml  Net  -1660 ml   Filed Weights   01/05/16 1100 01/06/16 0519  Weight: 133.176 kg (293 lb 9.6 oz) 76.794 kg (169 lb 4.8 oz)    Examination:  General exam: Alert, awake, oriented x 3 Respiratory system: Clear to auscultation. Respiratory effort normal. Cardiovascular system:RRR. No murmurs, rubs, gallops. Gastrointestinal system: Abdomen is nondistended, soft and nontender. No organomegaly or masses felt. Normal bowel sounds heard. Central nervous system: Alert and oriented. No focal neurological deficits. Extremities: 3-4+ pitting edema bilaterally Skin: No rashes, lesions or ulcers     Data Reviewed: I have personally reviewed following labs and imaging studies  CBC:  Recent Labs Lab 01/02/16 1105 01/02/16 1924 01/05/16 0210  WBC 3.7* 4.6 3.0*  NEUTROABS  --   --  1.7  HGB 10.6* 11.2* 10.3*  HCT 33.6* 35.9* 32.5*  MCV 84.2 84.9 83.8  PLT 254 281 253   Basic Metabolic Panel:  Recent Labs Lab 01/02/16 1105 01/02/16 1924 01/05/16 0210 01/06/16 0628  NA 139 137 139 143  K 3.7 3.3* 3.4* 3.5  CL 109 104 108 107  CO2 GLUCOSE 91 100* 90 87  BUN 18 14 21* 23*  CREATININE 1.02 1.12 1.16  1.09  CALCIUM 8.4* 8.5* 8.5* 8.8*  MG 2.1  --   --   --    GFR: Estimated Creatinine Clearance: 91 mL/min (by C-G formula based on Cr of 1.09). Liver Function Tests:  Recent Labs Lab 01/02/16 1105 01/05/16 0210  AST 27 24  ALT 24 22  ALKPHOS 75 70  BILITOT 1.3* 1.4*  PROT 6.8 6.6  ALBUMIN 3.6 3.5    Recent Labs Lab 01/05/16 0210  LIPASE 18   No results for input(s): AMMONIA in the last 168 hours. Coagulation Profile:  Recent Labs Lab 01/02/16 1105  INR 1.14   Cardiac Enzymes:  Recent Labs Lab 01/02/16 1105 01/05/16 0210 01/05/16 0809 01/05/16 1356 01/05/16 1945   TROPONINI 0.03* 0.39* 0.33* 0.27* 0.30*   BNP (last 3 results) No results for input(s): PROBNP in the last 8760 hours. HbA1C: No results for input(s): HGBA1C in the last 72 hours. CBG: No results for input(s): GLUCAP in the last 168 hours. Lipid Profile: No results for input(s): CHOL, HDL, LDLCALC, TRIG, CHOLHDL, LDLDIRECT in the last 72 hours. Thyroid Function Tests: No results for input(s): TSH, T4TOTAL, FREET4, T3FREE, THYROIDAB in the last 72 hours. Anemia Panel: No results for input(s): VITAMINB12, FOLATE, FERRITIN, TIBC, IRON, RETICCTPCT in the last 72 hours. Urine analysis:    Component Value Date/Time   COLORURINE YELLOW 01/05/2016 0327   APPEARANCEUR CLEAR 01/05/2016 0327   LABSPEC >1.030* 01/05/2016 0327   PHURINE 5.5 01/05/2016 0327   GLUCOSEU NEGATIVE 01/05/2016 0327   GLUCOSEU NEG mg/dL 01/24/2010 2254   HGBUR NEGATIVE 01/05/2016 0327   BILIRUBINUR NEGATIVE 01/05/2016 0327   KETONESUR NEGATIVE 01/05/2016 0327   PROTEINUR 30* 01/05/2016 0327   UROBILINOGEN 0.2 08/08/2014 1445   NITRITE NEGATIVE 01/05/2016 0327   LEUKOCYTESUR NEGATIVE 01/05/2016 0327   Sepsis Labs: @LABRCNTIP (procalcitonin:4,lacticidven:4)  )No results found for this or any previous visit (from the past 240 hour(s)).       Radiology Studies: Dg Chest 2 View  01/05/2016  CLINICAL DATA:  50 year old male with edema and bilateral lower extremity swelling. EXAM: CHEST  2 VIEW COMPARISON:  Chest radiograph dated 01/02/2016 FINDINGS: There is stable mild cardiomegaly. There is mild pulmonary vascular congestion without edema. There is no focal consolidation, pleural effusion, or pneumothorax. No acute osseous pathology. IMPRESSION: Stable cardiomegaly with mild congestive changes. No focal consolidation or edema. Electronically Signed   By: Arash  Radparvar M.D.   On: 01/05/2016 02:36   Ct Abdomen Pelvis W Contrast  01/05/2016  CLINICAL DATA:  Bilateral lower extremity swelling starting 3 days ago.  Left lower quadrant pain. EXAM: CT ABDOMEN AND PELVIS WITH CONTRAST TECHNIQUE: Multidetector CT imaging of the abdomen and pelvis was performed using the standard protocol following bolus administration of intravenous contrast. CONTRAST:  <MEASUREMENSanta Cruz Surgery CentEllK49Kentuc9528EOdessa F [facility] HospitEllK47Kentuc9528EOdessa F [facility] HospitEllK90Kentuc9528EOdessa F [facility] SystEllK58Kentuc9528EOdessa F [facility] HospitEllK35Kentuc9528EOdessa F [facility] HospitEllK44Kentuc9528EOdessa F [facility] HospitEllK56Kentuc9528EOdessa F [facility] LEllK22Kentuc9528EOdessa F [facility] HospitEllK33Kentuc9528EOdessa F [facility] ShrevepoEllK36Kentuc9528EOdessa F [facility] HospitEllK48Kentuc9528EOdessa F [facility] CentEllK76Kentuc9528EOdessa F [facility] InEllK71Kentuc9528EOdessa F [facility] CampE9528Odessa F [facility] CentEllK35Kentuc9528EOdessa F [facility] Surgery CentEllK2Kentuc9528EOdessa F [facility] DistriEllK50Kentuc9528EOdessa F [facility] HospitEllK66Kentuc9528EOdessa F [facility] Desert CanyEllK32Kentuc9528EOdessa F [facility] HospitEllK77Kentuc9528EOdessa F [facility] HospitEllK75Kentuc9528EOdessa F [facility] HospitEllK69Kentuc9528EOdessa Fleminganza HeirxIOPAMIDOL (ISOVUE-300) INJECTION 61% COMPARISON:  None. FINDINGS: Lower chest: Patchy airspace disease in both lung bases suggesting probable edema. Heart size is enlarged. Residual contrast material in the esophagus may represent reflux or dysmotility. Hepatobiliary: No masses or other significant abnormality. Pancreas: No mass, inflammatory changes, or other significant abnormality. Spleen: Within normal limits in size and appearance. Adrenals/Urinary Tract: No masses identified. No evidence of hydronephrosis. Bilateral renal cysts. Stomach/Bowel: No evidence of obstruction, inflammatory process, or abnormal fluid collections. Vascular/Lymphatic: No pathologically enlarged lymph nodes. No evidence of abdominal aortic aneurysm. Reproductive: No mass or other significant abnormality. Other: None. Musculoskeletal: No suspicious bone lesions identified. Schmorl's node at T11. IMPRESSION: Cardiac enlargement with airspace disease in both lung bases suggesting heart  failure with edema. No acute process demonstrated in the abdomen or pelvis. Electronically Signed   By: Burman Nieves M.D.   On: 01/05/2016 05:08        Scheduled Meds: . atorvastatin  80 mg Oral q1800  . budesonide  0.5 mg Nebulization BID  . furosemide  80 mg Intravenous Q12H  . lisinopril  5 mg Oral Daily  . metoprolol succinate  100 mg Oral BID  . potassium chloride SA  20 mEq Oral BID  . rivaroxaban  20 mg Oral QPM  . sodium chloride flush  3 mL Intravenous Q12H   Continuous Infusions:       Time spent: 25 minutes. Greater than 50% of this time was spent in direct contact with the patient coordinating care.     Chaya Jan, MD [facility] Pager  [phone]  If 7PM-7AM, please contact night-coverage [email or web address] Password [facility] 01/06/2016, 4:51 PM

## 2016-01-06 NOTE — Progress Notes (Signed)
Troponin called. Reviewed chart, labs. Troponins are flat. No change to tx plan at this time.  KJKG, NP

## 2016-01-07 LAB — BASIC METABOLIC PANEL
ANION GAP: 8 (ref 5–15)
BUN: 27 mg/dL — AB (ref 6–20)
CALCIUM: 8.9 mg/dL (ref 8.9–10.3)
CO2: 30 mmol/L (ref 22–32)
CREATININE: 1.23 mg/dL (ref 0.61–1.24)
Chloride: 104 mmol/L (ref 101–111)
GFR calc Af Amer: >60 mL/min (ref >60)
GLUCOSE: 89 mg/dL (ref 65–99)
Potassium: 3.5 mmol/L (ref 3.5–5.1)
Sodium: 142 mmol/L (ref 135–145)

## 2016-01-07 NOTE — Care Management Important Message (Signed)
Important Message  Patient Details  Name: [name] MRN: [id number] Date of Birth: [birth year]   Medicare Important Message Given:  Yes    Fuller Plan, RN 01/07/2016, 12:35 PM

## 2016-01-07 NOTE — Progress Notes (Signed)
PROGRESS NOTE    [name]  KGY:[id number] DOB: [birth year] DOA: 01/05/2016 PCP: Pearson Grippe, MD     Brief Narrative:  50 year old man admitted to the hospital on 7/16 for acute on chronic combined CHF. Well-known to [place] for frequent hospitalizations for the same due to medication and dietary indiscretion.   Assessment & Plan:   Active Problems:   Chronic atrial fibrillation (HCC)   Pulmonary hypertension (HCC)   OSA (obstructive sleep apnea)   CHF (congestive heart failure) (HCC)   Noncompliance with diet and medication regimen   Nonischemic cardiomyopathy (HCC)   Acute on chronic combined systolic and diastolic CHF (congestive heart failure) (HCC)   Elevated troponin   Acute on chronic combined CHF -Echo from April 2017 with ejection fraction of 20-25% and able to evaluate LV diastolic function. -Is 8.9 L negative since admission. -Continue current dose of Lasix 80 mg IV twice a day, his diureses adequately but is still markedly volume overloaded on exam.  Chronic atrial fibrillation -Rate controlled, anticoagulated on xarelto.  Elevated troponin -He denies chest pain, EKG without acute ischemic abnormalities. -Likely due to demand ischemia in the face of congestive heart failure with markedly decreased ejection fraction. -No cardiac workup planned for at present.   DVT prophylaxis: xarelto Code Status: Full code Family Communication: Patient only Disposition Plan: Anticipate at least 1-2 more days in the hospital  Consultants:   None  Procedures:   None  Antimicrobials:   None    Subjective: Feels well, as usual states that his PO Lasix is what causes his fluid gain and this is why he does not take it at home  Objective: Filed Vitals:   01/06/16 1420 01/06/16 1938 01/06/16 2228 01/07/16 0833  BP: 120/86  146/94   Pulse: 91  81   Temp: 97.8 F (36.6 C)  98.3 F (36.8 C)   TempSrc: Oral  Oral   Resp: 20  20   Height:      Weight:      SpO2: 99%  98% 99% 98%    Intake/Output Summary (Last 24 hours) at 01/07/16 1410 Last data filed at 01/07/16 0439  Gross per 24 hour  Intake    240 ml  Output   3975 ml  Net  -3735 ml   Filed Weights   01/05/16 1100 01/06/16 0519  Weight: 133.176 kg (293 lb 9.6 oz) 76.794 kg (169 lb 4.8 oz)    Examination:  General exam: Alert, awake, oriented x 3 Respiratory system: Clear to auscultation. Respiratory effort normal. Cardiovascular system:RRR. No murmurs, rubs, gallops. Gastrointestinal system: Abdomen is nondistended, soft and nontender. No organomegaly or masses felt. Normal bowel sounds heard. Central nervous system: Alert and oriented. No focal neurological deficits. Extremities: 3-4+ pitting edema bilaterally Skin: No rashes, lesions or ulcers     Data Reviewed: I have personally reviewed following labs and imaging studies  CBC:  Recent Labs Lab 01/02/16 1105 01/02/16 1924 01/05/16 0210  WBC 3.7* 4.6 3.0*  NEUTROABS  --   --  1.7  HGB 10.6* 11.2* 10.3*  HCT 33.6* 35.9* 32.5*  MCV 84.2 84.9 83.8  PLT 254 281 253   Basic Metabolic Panel:  Recent Labs Lab 01/02/16 1105 01/02/16 1924 01/05/16 0210 01/06/16 0628 01/07/16 0725  NA 139 137 139 143 142  K 3.7 3.3* 3.4* 3.5 3.5  CL 109 104 108 107 104  CO2 24 25 26 29 30   GLUCOSE 91 100* 90 87 89  BUN 18 14 21*  23* 27*  CREATININE 1.02 1.12 1.16 1.09 1.23  CALCIUM 8.4* 8.5* 8.5* 8.8* 8.9  MG 2.1  --   --   --   --    GFR: Estimated Creatinine Clearance: 80.7 mL/min (by C-G formula based on Cr of 1.23). Liver Function Tests:  Recent Labs Lab 01/02/16 1105 01/05/16 0210  AST 27 24  ALT 24 22  ALKPHOS 75 70  BILITOT 1.3* 1.4*  PROT 6.8 6.6  ALBUMIN 3.6 3.5    Recent Labs Lab 01/05/16 0210  LIPASE 18   No results for input(s): AMMONIA in the last 168 hours. Coagulation Profile:  Recent Labs Lab 01/02/16 1105  INR 1.14   Cardiac Enzymes:  Recent Labs Lab 01/02/16 1105 01/05/16 0210  01/05/16 0809 01/05/16 1356 01/05/16 1945  TROPONINI 0.03* 0.39* 0.33* 0.27* 0.30*   BNP (last 3 results) No results for input(s): PROBNP in the last 8760 hours. HbA1C: No results for input(s): HGBA1C in the last 72 hours. CBG: No results for input(s): GLUCAP in the last 168 hours. Lipid Profile: No results for input(s): CHOL, HDL, LDLCALC, TRIG, CHOLHDL, LDLDIRECT in the last 72 hours. Thyroid Function Tests: No results for input(s): TSH, T4TOTAL, FREET4, T3FREE, THYROIDAB in the last 72 hours. Anemia Panel: No results for input(s): VITAMINB12, FOLATE, FERRITIN, TIBC, IRON, RETICCTPCT in the last 72 hours. Urine analysis:    Component Value Date/Time   COLORURINE YELLOW 01/05/2016 0327   APPEARANCEUR CLEAR 01/05/2016 0327   LABSPEC >1.030* 01/05/2016 0327   PHURINE 5.5 01/05/2016 0327   GLUCOSEU NEGATIVE 01/05/2016 0327   GLUCOSEU NEG mg/dL 09/81/1914 7829   HGBUR NEGATIVE 01/05/2016 0327   BILIRUBINUR NEGATIVE 01/05/2016 0327   KETONESUR NEGATIVE 01/05/2016 0327   PROTEINUR 30* 01/05/2016 0327   UROBILINOGEN 0.2 08/08/2014 1445   NITRITE NEGATIVE 01/05/2016 0327   LEUKOCYTESUR NEGATIVE 01/05/2016 0327   Sepsis Labs: (procalcitonin:4,lacticidven:4)  )No results found for this or any previous visit (from the past 240 hour(s)).       Radiology Studies: No results found.      Scheduled Meds: . atorvastatin  80 mg Oral q1800  . budesonide  0.5 mg Nebulization BID  . furosemide  80 mg Intravenous Q12H  . lisinopril  5 mg Oral Daily  . metoprolol succinate  100 mg Oral BID  . potassium chloride SA  20 mEq Oral BID  . rivaroxaban  20 mg Oral QPM  . sodium chloride flush  3 mL Intravenous Q12H   Continuous Infusions:    LOS: 1 day    Time spent: 25 minutes. Greater than 50% of this time was spent in direct contact with the patient coordinating care.     Chaya Jan, MD [facility] Pager [phone]  If 7PM-7AM, please  contact night-coverage [email or web address] Password [email or web address] 01/07/2016, 2:10 PM

## 2016-01-08 DIAGNOSIS — I5043 Acute on chronic combined systolic (congestive) and diastolic (congestive) heart failure: Secondary | ICD-10-CM

## 2016-01-08 DIAGNOSIS — I429 Cardiomyopathy, unspecified: Secondary | ICD-10-CM

## 2016-01-08 DIAGNOSIS — I482 Chronic atrial fibrillation: Secondary | ICD-10-CM

## 2016-01-08 DIAGNOSIS — R7989 Other specified abnormal findings of blood chemistry: Secondary | ICD-10-CM

## 2016-01-08 DIAGNOSIS — G4733 Obstructive sleep apnea (adult) (pediatric): Secondary | ICD-10-CM

## 2016-01-08 LAB — BASIC METABOLIC PANEL
Anion gap: 7 (ref 5–15)
BUN: 25 mg/dL — ABNORMAL HIGH (ref 6–20)
CO2: 29 mmol/L (ref 22–32)
Calcium: 8.9 mg/dL (ref 8.9–10.3)
Chloride: 106 mmol/L (ref 101–111)
Creatinine, Ser: 1.16 mg/dL (ref 0.61–1.24)
GFR calc Af Amer: >60 mL/min (ref >60)
Glucose, Bld: 87 mg/dL (ref 65–99)
POTASSIUM: 3.3 mmol/L — AB (ref 3.5–5.1)
SODIUM: 142 mmol/L (ref 135–145)

## 2016-01-08 MED ORDER — POTASSIUM CHLORIDE CRYS ER 20 MEQ PO TBCR
40.0000 meq | EXTENDED_RELEASE_TABLET | Freq: Two times a day (BID) | ORAL | Status: AC
  Administered 2016-01-09: 40 meq via ORAL
  Filled 2016-01-08: qty 2

## 2016-01-08 NOTE — Progress Notes (Signed)
PROGRESS NOTE    [name]  [id number]:[id number] DOB: 03-08-1966 DOA: 01/05/2016 PCP: Pearson Grippe, MD    Brief Narrative:  Patient is a 50 year old male with a history of nonischemic cardiomyopathy with an EF of 20-25% per echo in April 2017, chronic atrial fibrillation on Xarelto, OSA, multiple hospitalizations and noncompliance, who presented to the ED on 01/06/2016 once again for worsening swelling in his legs. He was terminated from cardiology due to noncompliance. In the ED, he was hemodynamically stable. His BNP was 714. His other lab data revealed a potassium of 3.4, troponin I of 0.39, W BC of 3.0, and hemoglobin of 10.3. Urine drug screen was negative. His chest x-ray revealed mild congestive changes. He was admitted for further evaluation and management.   Assessment & Plan:   Principal Problem:   Acute on chronic combined systolic and diastolic CHF (congestive heart failure) (HCC) Active Problems:   Pulmonary hypertension (HCC)   OSA (obstructive sleep apnea)   Noncompliance with diet and medication regimen   Elevated troponin   OBESITY, MORBID   Chronic atrial fibrillation (HCC)   Nonischemic cardiomyopathy (HCC)  50 year old man with acute on chronic combined systolic and diastolic CHF, chronic atrial fibrillation, OSA-but could not tolerate CPAP; chronic anticoagulation due to atrial fibrillation, and noncompliance. Reportedly, he was discharged from cardiology due to noncompliance. He was restarted on all of his chronic medications except that Lasix was restarted IV. History of troponin I was elevated on admission, likely from congestive heart failure rather than ACS. It trended down to 0.30. His TSH was within normal limits in June 2017. He has diuresed well and is -10.4 L as of today. He acknowledges less peripheral edema. His atrial fibrillation is rate controlled and he is anticoagulated with Xarelto.  Plan: 1. Continue current management specifically Lasix 80 mg IV every 12  hours. 2. Consider possible discharge in the next 24-48 hours. 3. Patient was encouraged to become more compliant with his medical regimen.     DVT prophylaxis: Xarelto Code Status: Full code Family Communication: Family not available Disposition Plan: Discharged home in the next 24-48 hours.   Consultants:   None  Procedures:   None  Antimicrobials:  None   Subjective: Patient feels that the swelling in his legs are going down. He denies chest pain or shortness of breath.  Objective: Filed Vitals:   01/08/16 0444 01/08/16 0630 01/08/16 0900 01/08/16 1517  BP:    122/88  Pulse:  83  57  Temp:  97.5 F (36.4 C)  98.4 F (36.9 C)  TempSrc:  Oral  Oral  Resp:    16  Height:      Weight: 127.597 kg (281 lb 4.8 oz)     SpO2:  100% 98% 98%    Intake/Output Summary (Last 24 hours) at 01/08/16 1818 Last data filed at 01/08/16 1802  Gross per 24 hour  Intake    720 ml  Output   2000 ml  Net  -1280 ml   Filed Weights   01/05/16 1100 01/06/16 0519 01/08/16 0444  Weight: 133.176 kg (293 lb 9.6 oz) 76.794 kg (169 lb 4.8 oz) 127.597 kg (281 lb 4.8 oz)    Examination:  General exam: Appears calm and comfortable  Respiratory system: Clear to auscultation. Respiratory effort normal. Cardiovascular system: Irregular, irregular 2+ bilateral pitting lower extremity edema Gastrointestinal system: Abdomen is nondistended, soft and nontender. No organomegaly or masses felt. Normal bowel sounds heard. Central nervous system: Alert and oriented. No  focal neurological deficits. Extremities: Symmetric 5 x 5 power. Skin: No rashes, lesions or ulcers Psychiatry: Judgement and insight appear normal. Mood & affect appropriate.     Data Reviewed: I have personally reviewed following labs and imaging studies  CBC:  Recent Labs Lab 01/02/16 1105 01/02/16 1924 01/05/16 0210  WBC 3.7* 4.6 3.0*  NEUTROABS  --   --  1.7  HGB 10.6* 11.2* 10.3*  HCT 33.6* 35.9* 32.5*  MCV  84.2 84.9 83.8  PLT 254 281 253   Basic Metabolic Panel:  Recent Labs Lab 01/02/16 1105 01/02/16 1924 01/05/16 0210 01/06/16 0628 01/07/16 0725 01/08/16 0531  NA 139 137 139 143 142 142  K 3.7 3.3* 3.4* 3.5 3.5 3.3*  CL 109 104 108 107 104 106  CO2 GLUCOSE 91 100* 90 87 89 87  BUN 18 14 21* 23* 27* 25*  CREATININE 1.02 1.12 1.16 1.09 1.23 1.16  CALCIUM 8.4* 8.5* 8.5* 8.8* 8.9 8.9  MG 2.1  --   --   --   --   --    GFR: Estimated Creatinine Clearance: 108.7 mL/min (by C-G formula based on Cr of 1.16). Liver Function Tests:  Recent Labs Lab 01/02/16 1105 01/05/16 0210  AST 27 24  ALT 24 22  ALKPHOS 75 70  BILITOT 1.3* 1.4*  PROT 6.8 6.6  ALBUMIN 3.6 3.5    Recent Labs Lab 01/05/16 0210  LIPASE 18   No results for input(s): AMMONIA in the last 168 hours. Coagulation Profile:  Recent Labs Lab 01/02/16 1105  INR 1.14   Cardiac Enzymes:  Recent Labs Lab 01/02/16 1105 01/05/16 0210 01/05/16 0809 01/05/16 1356 01/05/16 1945  TROPONINI 0.03* 0.39* 0.33* 0.27* 0.30*   BNP (last 3 results) No results for input(s): PROBNP in the last 8760 hours. HbA1C: No results for input(s): HGBA1C in the last 72 hours. CBG: No results for input(s): GLUCAP in the last 168 hours. Lipid Profile: No results for input(s): CHOL, HDL, LDLCALC, TRIG, CHOLHDL, LDLDIRECT in the last 72 hours. Thyroid Function Tests: No results for input(s): TSH, T4TOTAL, FREET4, T3FREE, THYROIDAB in the last 72 hours. Anemia Panel: No results for input(s): VITAMINB12, FOLATE, FERRITIN, TIBC, IRON, RETICCTPCT in the last 72 hours. Sepsis Labs: No results for input(s): PROCALCITON, LATICACIDVEN in the last 168 hours.  No results found for this or any previous visit (from the past 240 hour(s)).       Radiology Studies: No results found.      Scheduled Meds: . atorvastatin  80 mg Oral q1800  . budesonide  0.5 mg Nebulization BID  . furosemide  80 mg Intravenous  Q12H  . lisinopril  5 mg Oral Daily  . metoprolol succinate  100 mg Oral BID  . potassium chloride SA  20 mEq Oral BID  . rivaroxaban  20 mg Oral QPM  . sodium chloride flush  3 mL Intravenous Q12H   Continuous Infusions:    LOS: 2 days    Time spent: 25 minutes    Elliot Cousin, MD [facility] Pager [phone]  If 7PM-7AM, please contact night-coverage [email or web address] Password [email or web address] 01/08/2016, 6:18 PM

## 2016-01-09 DIAGNOSIS — I272 Other secondary pulmonary hypertension: Secondary | ICD-10-CM

## 2016-01-09 DIAGNOSIS — Z9111 Patient's noncompliance with dietary regimen: Secondary | ICD-10-CM

## 2016-01-09 DIAGNOSIS — Z9114 Patient's other noncompliance with medication regimen: Secondary | ICD-10-CM

## 2016-01-09 LAB — BASIC METABOLIC PANEL
Anion gap: 10 (ref 5–15)
BUN: 24 mg/dL — AB (ref 6–20)
CHLORIDE: 104 mmol/L (ref 101–111)
CO2: 28 mmol/L (ref 22–32)
Calcium: 8.8 mg/dL — ABNORMAL LOW (ref 8.9–10.3)
Creatinine, Ser: 1.11 mg/dL (ref 0.61–1.24)
GFR calc Af Amer: >60 mL/min (ref >60)
GLUCOSE: 85 mg/dL (ref 65–99)
POTASSIUM: 3.1 mmol/L — AB (ref 3.5–5.1)
Sodium: 142 mmol/L (ref 135–145)

## 2016-01-09 LAB — CBC
HEMATOCRIT: 37.7 % — AB (ref 39.0–52.0)
Hemoglobin: 11.8 g/dL — ABNORMAL LOW (ref 13.0–17.0)
MCH: 26.3 pg (ref 26.0–34.0)
MCHC: 31.3 g/dL (ref 30.0–36.0)
MCV: 84 fL (ref 78.0–100.0)
Platelets: 255 10*3/uL (ref 150–400)
RBC: 4.49 MIL/uL (ref 4.22–5.81)
RDW: 15.2 % (ref 11.5–15.5)
WBC: 3 10*3/uL — ABNORMAL LOW (ref 4.0–10.5)

## 2016-01-09 LAB — TROPONIN I: Troponin I: 0.03 ng/mL (ref <0.03)

## 2016-01-09 MED ORDER — POTASSIUM CHLORIDE CRYS ER 20 MEQ PO TBCR: EXTENDED_RELEASE_TABLET | ORAL | Status: DC

## 2016-01-09 MED ORDER — POTASSIUM CHLORIDE CRYS ER 20 MEQ PO TBCR
40.0000 meq | EXTENDED_RELEASE_TABLET | Freq: Three times a day (TID) | ORAL | Status: DC
  Administered 2016-01-09: 40 meq via ORAL
  Filled 2016-01-09: qty 2

## 2016-01-09 NOTE — Care Management (Signed)
Resumption of home health services with [facility]. [facility] notified of discharge.

## 2016-01-09 NOTE — Discharge Summary (Signed)
Physician Discharge Summary  [name] [id number]:[id number] DOB: [birth year] DOA: 01/05/2016  PCP: Pearson Grippe, MD  Admit date: 01/05/2016 Discharge date: 01/09/2016  Time spent: Greater than 30 minutes  Recommendations for Outpatient Follow-up:  1. Recommend follow-up of the patient's serum potassium and compliance with medication therapy.     Discharge Diagnoses:  Principal Problem:   Acute on chronic combined systolic and diastolic CHF (congestive heart failure) (HCC) Active Problems:   Pulmonary hypertension (HCC)   OSA (obstructive sleep apnea)   Noncompliance with diet and medication regimen   Elevated troponin   OBESITY, MORBID   Chronic atrial fibrillation (HCC)   Nonischemic cardiomyopathy (HCC)   Discharge Condition: Improved  Diet recommendation: Heart healthy  Filed Weights   01/06/16 0519 01/08/16 0444 01/09/16 0524  Weight: 76.794 kg (169 lb 4.8 oz) 127.597 kg (281 lb 4.8 oz) 125.42 kg (276 lb 8 oz)    History of present illness:  Patient is a 50 year old male with a history of nonischemic cardiomyopathy with an EF of 20-25% per echo in April 2017, normal coronary arteries per heart catheterization December 2016, chronic atrial fibrillation on Xarelto, OSA, multiple hospitalizations and noncompliance, who presented to the ED on 01/06/2016 once again for worsening swelling in his legs. He was terminated from cardiology due to noncompliance. In the ED, he was hemodynamically stable. His BNP was 714. His other lab data revealed a potassium of 3.4, troponin I of 0.39, WBC of 3.0, and hemoglobin of 10.3. Urine drug screen was negative. His chest x-ray revealed mild congestive changes. He was admitted for further evaluation and management.  Hospital Course:  The patient was started on all of his chronic medications except the Lasix was restarted IV at 80 mg every 12 hours. His troponin I was elevated on admission, likely from congestive heart failure rather than ACS as he had  normal coronary arteries per heart catheterization in December 2016. Troponin I was cycled and it did trend downward to 0.30. In review of his chart, his TSH was within normal limits in June 2017. His ins and outs were ordered daily. His weight was as well. Over the course of the hospitalization, he diuresed 12.8 L. His weight was reported as approximately 294 pounds on admission and was approximately 277 pounds at the time of discharge. He improved clinically and symptomatically.  His potassium fell gradually following IV diuresis with Lasix. He was given 40 mEq of potassium chloride at the time of discharge for a potassium level of 3.1. He was instructed to take 40 mEq of potassium chloride twice a day 7 days and then back to 20 mEq twice a day. The patient will need to have a follow-up basic metabolic panel drawn at his hospital follow-up appointment.     Procedures:  None  Consultations:  None  Discharge Exam: Filed Vitals:   01/08/16 2208 01/09/16 0524  BP: 115/76 120/68  Pulse: 74 68  Temp: 97.9 F (36.6 C) 98.2 F (36.8 C)  Resp: 16 16  Oxygen saturation 100% on room air.  General: Obese 50 year old African-American man in no acute distress. Cardiovascular: Irregular, irregular. Respiratory: Clear to auscultation bilaterally Extremities: Trace to 1+ pitting bilateral lower extremity edema, significantly decreased from 3-4+.  Discharge Instructions   Discharge Instructions    Diet - low sodium heart healthy    Complete by:  As directed      Discharge instructions    Complete by:  As directed   Take medications as prescribed without missing  any doses.     Increase activity slowly    Complete by:  As directed           Current Discharge Medication List    CONTINUE these medications which have CHANGED   Details  potassium chloride SA (K-DUR,KLOR-CON) 20 MEQ tablet Take 2 tablets 2 times daily for 7 days and then 1 tablet 2 times daily thereafter. Qty: 80 tablet,  Refills: 1      CONTINUE these medications which have NOT CHANGED   Details  atorvastatin (LIPITOR) 80 MG tablet Take 80 mg by mouth daily.    beclomethasone (QVAR) 80 MCG/ACT inhaler Inhale 2 puffs into the lungs daily as needed (shortness of breath). Qty: 1 Inhaler, Refills: 12    furosemide (LASIX) 80 MG tablet Take 1 tablet (80 mg total) by mouth 2 (two) times daily. Qty: 180 tablet, Refills: 1    lisinopril (PRINIVIL,ZESTRIL) 5 MG tablet Take 1 tablet (5 mg total) by mouth daily. Qty: 30 tablet, Refills: 1    metoprolol succinate (TOPROL-XL) 100 MG 24 hr tablet Take 1 tablet (100 mg total) by mouth 2 (two) times daily. Take with or immediately following a meal. Qty: 60 tablet, Refills: 6    rivaroxaban (XARELTO) 20 MG TABS tablet Take 1 tablet (20 mg total) by mouth every evening. Qty: 30 tablet, Refills: 2       Allergies  Allergen Reactions  . Bee Venom Shortness Of Breath and Swelling  . Lipitor [Atorvastatin] Shortness Of Breath and Other (See Comments)    Nose bleed  . Aspirin Other (See Comments)    Chewable 81 mg tablets upsets patients stomach  . Banana Diarrhea  . Diltiazem Other (See Comments)    Makes heart beat fast  . Hydralazine Other (See Comments)    Patient states it causes Tachycardia   Follow-up Information    Follow up with Dierdre Highman On 01/16/2016.   Why:  at 3:00 pm   Contact information:   [address] [place] [address] [phone]        The results of significant diagnostics from this hospitalization (including imaging, microbiology, ancillary and laboratory) are listed below for reference.    Significant Diagnostic Studies: Dg Chest 2 View  01/05/2016  CLINICAL DATA:  50 year old male with edema and bilateral lower extremity swelling. EXAM: CHEST  2 VIEW COMPARISON:  Chest radiograph dated 01/02/2016 FINDINGS: There is stable mild cardiomegaly. There is mild pulmonary vascular congestion without edema. There is no focal  consolidation, pleural effusion, or pneumothorax. No acute osseous pathology. IMPRESSION: Stable cardiomegaly with mild congestive changes. No focal consolidation or edema. Electronically Signed   By: Elgie Collard M.D.   On: 01/05/2016 02:36   Dg Chest 2 View  01/02/2016  CLINICAL DATA:  Shortness of breath for 2 weeks EXAM: CHEST  2 VIEW COMPARISON:  01/02/2016 FINDINGS: Mild bilateral interstitial thickening. There is no focal parenchymal opacity. There is no pleural effusion or pneumothorax. There is stable cardiomegaly. The osseous structures are unremarkable. IMPRESSION: Cardiomegaly with mild pulmonary vascular congestion. Electronically Signed   By: Elige Ko   On: 01/02/2016 21:24   Dg Chest 2 View  01/02/2016  CLINICAL DATA:  Shortness of breath and fluid retention. EXAM: CHEST  2 VIEW COMPARISON:  12/21/2015. FINDINGS: Low volumes. The cardio pericardial silhouette is enlarged. There is pulmonary vascular congestion without overt pulmonary edema. A component of interstitial edema at the bases is suspected. The visualized bony structures of the thorax  are intact. Telemetry leads overlie the chest. IMPRESSION: Low volume film with cardiomegaly and interstitial pulmonary edema. Electronically Signed   By: Kennith Center M.D.   On: 01/02/2016 12:13   Dg Chest 2 View  12/21/2015  CLINICAL DATA:  Shortness of Breath EXAM: CHEST  2 VIEW COMPARISON:  12/15/2015 FINDINGS: Cardiac shadow is enlarged. Mild vascular congestion is noted. No focal infiltrate or sizable effusion is seen. IMPRESSION: Mild CHF. Electronically Signed   By: Alcide Clever M.D.   On: 12/21/2015 12:56   Dg Chest 2 View  12/15/2015  CLINICAL DATA:  Acute onset of tachycardia and palpitations. Mid to left-sided chest tightness and productive cough. Bilateral leg swelling and shortness of breath. Initial encounter. EXAM: CHEST  2 VIEW COMPARISON:  Chest radiograph from 12/02/2015 FINDINGS: The lungs are well-aerated. Vascular  congestion is noted. Increased interstitial markings raise concern for mild pulmonary edema. There is no evidence of pleural effusion or pneumothorax. The heart is borderline enlarged. No acute osseous abnormalities are seen. IMPRESSION: Vascular congestion and borderline cardiomegaly. Increased interstitial markings raise concern for mild pulmonary edema. Electronically Signed   By: Roanna Raider M.D.   On: 12/15/2015 01:45   Ct Abdomen Pelvis W Contrast  01/05/2016  CLINICAL DATA:  Bilateral lower extremity swelling starting 3 days ago. Left lower quadrant pain. EXAM: CT ABDOMEN AND PELVIS WITH CONTRAST TECHNIQUE: Multidetector CT imaging of the abdomen and pelvis was performed using the standard protocol following bolus administration of intravenous contrast. CONTRAST:  ISOVUE-300 IOPAMIDOL (ISOVUE-300) INJECTION 61% COMPARISON:  None. FINDINGS: Lower chest: Patchy airspace disease in both lung bases suggesting probable edema. Heart size is enlarged. Residual contrast material in the esophagus may represent reflux or dysmotility. Hepatobiliary: No masses or other significant abnormality. Pancreas: No mass, inflammatory changes, or other significant abnormality. Spleen: Within normal limits in size and appearance. Adrenals/Urinary Tract: No masses identified. No evidence of hydronephrosis. Bilateral renal cysts. Stomach/Bowel: No evidence of obstruction, inflammatory process, or abnormal fluid collections. Vascular/Lymphatic: No pathologically enlarged lymph nodes. No evidence of abdominal aortic aneurysm. Reproductive: No mass or other significant abnormality. Other: None. Musculoskeletal: No suspicious bone lesions identified. Schmorl's node at T11. IMPRESSION: Cardiac enlargement with airspace disease in both lung bases suggesting heart failure with edema. No acute process demonstrated in the abdomen or pelvis. Electronically Signed   By: Burman Nieves M.D.   On: 01/05/2016 05:08   Dg Chest  Portable 1 View  12/15/2015  CLINICAL DATA:  Shortness of breath.  Chest pain. EXAM: PORTABLE CHEST 1 VIEW COMPARISON:  December 15, 2015 FINDINGS: Cardiomegaly and pulmonary venous congestion with no overt edema, nodule, or mass. The hila and mediastinum are unchanged. IMPRESSION: Cardiomegaly and pulmonary venous congestion. Electronically Signed   By: Gerome Sam III M.D   On: 12/15/2015 20:43    Microbiology: No results found for this or any previous visit (from the past 240 hour(s)).   Labs: Basic Metabolic Panel:  Recent Labs Lab 01/05/16 0210 01/06/16 0628 01/07/16 0725 01/08/16 0531 01/09/16 0536  NA 139 143 142 142 142  K 3.4* 3.5 3.5 3.3* 3.1*  CL 108 107 104 106 104  CO2 26 29 30 29 28   GLUCOSE 90 87 89 87 85  BUN 21* 23* 27* 25* 24*  CREATININE 1.16 1.09 1.23 1.16 1.11  CALCIUM 8.5* 8.8* 8.9 8.9 8.8*   Liver Function Tests:  Recent Labs Lab 01/05/16 0210  AST 24  ALT 22  ALKPHOS 70  BILITOT 1.4*  PROT  6.6  ALBUMIN 3.5    Recent Labs Lab 01/05/16 0210  LIPASE 18   No results for input(s): AMMONIA in the last 168 hours. CBC:  Recent Labs Lab 01/02/16 1924 01/05/16 0210 01/09/16 0536  WBC 4.6 3.0* 3.0*  NEUTROABS  --  1.7  --   HGB 11.2* 10.3* 11.8*  HCT 35.9* 32.5* 37.7*  MCV 84.9 83.8 84.0  PLT 281 253 255   Cardiac Enzymes:  Recent Labs Lab 01/05/16 0210 01/05/16 0809 01/05/16 1356 01/05/16 1945 01/09/16 0536  TROPONINI 0.39* 0.33* 0.27* 0.30* 0.03*   BNP: BNP (last 3 results)  Recent Labs  01/02/16 1105 01/02/16 1924 01/05/16 0210  BNP 733.0* 592.4* 714.0*    ProBNP (last 3 results) No results for input(s): PROBNP in the last 8760 hours.  CBG: No results for input(s): GLUCAP in the last 168 hours.     Signed:  Elia Nunley MD.  [facility] 01/09/2016, 12:06 PM

## 2016-01-09 NOTE — Progress Notes (Signed)
Pt discharged home today per Dr. Sherrie Mustache.  Pt's IV site D/C'd and WDL.  Pt's VSS.  Pt provided with home medication list, discharge instructions and prescriptions.  Verbalized understanding.  Pt left floor in stable condition accompanied by NT.

## 2016-01-17 ENCOUNTER — Encounter (HOSPITAL_COMMUNITY): Payer: Self-pay | Admitting: *Deleted

## 2016-01-17 ENCOUNTER — Emergency Department (HOSPITAL_COMMUNITY): Payer: Medicare Other

## 2016-01-17 ENCOUNTER — Emergency Department (HOSPITAL_COMMUNITY)
Admission: EM | Admit: 2016-01-17 | Discharge: 2016-01-17 | Disposition: A | Discharge status: Home / Self Care | Payer: Medicare Other | Attending: Emergency Medicine | Admitting: Emergency Medicine

## 2016-01-17 DIAGNOSIS — I110 Hypertensive heart disease with heart failure: Secondary | ICD-10-CM | POA: Diagnosis not present

## 2016-01-17 DIAGNOSIS — Z7901 Long term (current) use of anticoagulants: Secondary | ICD-10-CM | POA: Insufficient documentation

## 2016-01-17 DIAGNOSIS — Z87891 Personal history of nicotine dependence: Secondary | ICD-10-CM | POA: Insufficient documentation

## 2016-01-17 DIAGNOSIS — I5023 Acute on chronic systolic (congestive) heart failure: Secondary | ICD-10-CM | POA: Diagnosis not present

## 2016-01-17 DIAGNOSIS — J45909 Unspecified asthma, uncomplicated: Secondary | ICD-10-CM | POA: Insufficient documentation

## 2016-01-17 DIAGNOSIS — Z79899 Other long term (current) drug therapy: Secondary | ICD-10-CM | POA: Insufficient documentation

## 2016-01-17 DIAGNOSIS — R42 Dizziness and giddiness: Secondary | ICD-10-CM | POA: Diagnosis present

## 2016-01-17 LAB — URINE MICROSCOPIC-ADD ON
BACTERIA UA: NONE SEEN
SQUAMOUS EPITHELIAL / LPF: NONE SEEN
WBC, UA: NONE SEEN WBC/hpf (ref 0–5)

## 2016-01-17 LAB — CBC WITH DIFFERENTIAL/PLATELET
BASOS ABS: 0 10*3/uL (ref 0.0–0.1)
BASOS PCT: 0 %
EOS ABS: 0.1 10*3/uL (ref 0.0–0.7)
Eosinophils Relative: 3 %
HCT: 33.7 % — ABNORMAL LOW (ref 39.0–52.0)
HEMOGLOBIN: 10.9 g/dL — AB (ref 13.0–17.0)
Lymphocytes Relative: 22 %
Lymphs Abs: 0.9 10*3/uL (ref 0.7–4.0)
MCH: 27 pg (ref 26.0–34.0)
MCHC: 32.3 g/dL (ref 30.0–36.0)
MCV: 83.4 fL (ref 78.0–100.0)
Monocytes Absolute: 0.3 10*3/uL (ref 0.1–1.0)
Monocytes Relative: 8 %
NEUTROS PCT: 67 %
Neutro Abs: 2.7 10*3/uL (ref 1.7–7.7)
Platelets: 167 10*3/uL (ref 150–400)
RBC: 4.04 MIL/uL — ABNORMAL LOW (ref 4.22–5.81)
RDW: 15.5 % (ref 11.5–15.5)
WBC: 4 10*3/uL (ref 4.0–10.5)

## 2016-01-17 LAB — BLOOD GAS, ARTERIAL
Acid-base deficit: 0.4 mmol/L (ref 0.0–2.0)
Bicarbonate: 24.3 mEq/L — ABNORMAL HIGH (ref 20.0–24.0)
DRAWN BY: 277331
FIO2: 0.21
O2 Saturation: 91.4 %
PCO2 ART: 33 mmHg — AB (ref 35.0–45.0)
PH ART: 7.457 — AB (ref 7.350–7.450)
Patient temperature: 37
TCO2: 33 mmol/L (ref 0–100)
pO2, Arterial: 65.3 mmHg — ABNORMAL LOW (ref 80.0–100.0)

## 2016-01-17 LAB — URINALYSIS, ROUTINE W REFLEX MICROSCOPIC
Bilirubin Urine: NEGATIVE
Glucose, UA: NEGATIVE mg/dL
KETONES UR: NEGATIVE mg/dL
LEUKOCYTES UA: NEGATIVE
NITRITE: NEGATIVE
PROTEIN: 100 mg/dL — AB
Specific Gravity, Urine: 1.02 (ref 1.005–1.030)
pH: 6 (ref 5.0–8.0)

## 2016-01-17 LAB — COMPREHENSIVE METABOLIC PANEL
ALBUMIN: 3.5 g/dL (ref 3.5–5.0)
ALK PHOS: 65 U/L (ref 38–126)
ALT: 21 U/L (ref 17–63)
ANION GAP: 3 — AB (ref 5–15)
AST: 25 U/L (ref 15–41)
BUN: 18 mg/dL (ref 6–20)
CO2: 23 mmol/L (ref 22–32)
Calcium: 8.2 mg/dL — ABNORMAL LOW (ref 8.9–10.3)
Chloride: 113 mmol/L — ABNORMAL HIGH (ref 101–111)
Creatinine, Ser: 1.04 mg/dL (ref 0.61–1.24)
GFR calc Af Amer: >60 mL/min (ref >60)
GFR calc non Af Amer: >60 mL/min (ref >60)
GLUCOSE: 87 mg/dL (ref 65–99)
POTASSIUM: 3.5 mmol/L (ref 3.5–5.1)
SODIUM: 139 mmol/L (ref 135–145)
Total Bilirubin: 1.5 mg/dL — ABNORMAL HIGH (ref 0.3–1.2)
Total Protein: 6.5 g/dL (ref 6.5–8.1)

## 2016-01-17 LAB — RAPID URINE DRUG SCREEN, HOSP PERFORMED
Amphetamines: NOT DETECTED
BENZODIAZEPINES: NOT DETECTED
Barbiturates: NOT DETECTED
COCAINE: NOT DETECTED
Opiates: NOT DETECTED
Tetrahydrocannabinol: NOT DETECTED

## 2016-01-17 LAB — TROPONIN I
TROPONIN I: 0.03 ng/mL — AB (ref <0.03)
TROPONIN I: 0.03 ng/mL — AB (ref <0.03)

## 2016-01-17 LAB — BRAIN NATRIURETIC PEPTIDE: B NATRIURETIC PEPTIDE 5: 934 pg/mL — AB (ref 0.0–100.0)

## 2016-01-17 MED ORDER — FUROSEMIDE 10 MG/ML IJ SOLN
40.0000 mg | Freq: Once | INTRAMUSCULAR | Status: AC
  Administered 2016-01-17: 40 mg via INTRAVENOUS
  Filled 2016-01-17: qty 4

## 2016-01-17 MED ORDER — LISINOPRIL 5 MG PO TABS: 5.0000 mg | ORAL_TABLET | Freq: Every day | ORAL | 1 refills | Status: DC

## 2016-01-17 MED ORDER — METOPROLOL SUCCINATE ER 100 MG PO TB24: 100.0000 mg | ORAL_TABLET | Freq: Two times a day (BID) | ORAL | 6 refills | Status: DC

## 2016-01-17 NOTE — ED Provider Notes (Signed)
[facility]EMER[facility]ENCY DEPT Provider Note   CSN: [id number] Arrival date & time: 01/17/16  8657  First Provider Contact:  9:30 AM    By signing my name below, I, Placido Sou, attest that this documentation has been prepared under the direction and in the presence of Glynn Octave, MD. Electronically Signed: Placido Sou, ED Scribe. 01/17/16. 9:45 AM.   History   Chief Complaint Chief Complaint  Patient presents with  . Shortness of Breath   HPI HPI Comments: [name] is a 50 y.o. male with a PMHx of CHF, extremity swelling, a-fib and HTN who presents to the Emergency Department complaining of moderate SOB x 3 days. He reports associated leg swelling, fatigue, weight gain and states his abd "is full of fluid". Pt states he has been compliant with his medications but states "it makes my heart beat get out of rhythm". Per nursing note, pt was last hospitalized for his CHF on 01/05/16. Pt confirms being on Xarelto with his last dose being 1 day ago and is not clear in regards to being regularly compliant with his Xarelto. He denies a PMHx of DM. He denies CP and abd pain.   The history is provided by the patient. No language interpreter was used.   Past Medical History:  Diagnosis Date  . Allergic rhinitis   . Asthma   . Atrial fibrillation (HCC)   . Essential hypertension   . Gastroesophageal reflux disease   . History of cardiac catheterization    Normal coronaries December 2016  . Noncompliance    Major problem leading to declining health and recurrent hospitalization  . Nonischemic cardiomyopathy (HCC)    LVEF 20-25%  . OSA (obstructive sleep apnea)   . Osteoarthritis   . Peptic ulcer disease     Patient Active Problem List   Diagnosis Date Noted  . Acute systolic CHF (congestive heart failure) (HCC) 12/21/2015  . Acute on chronic combined systolic and diastolic CHF (congestive heart failure) (HCC) 12/21/2015  . Elevated troponin 12/21/2015  . Congestive heart failure  (CHF) (HCC) 12/03/2015  . Acute on chronic combined systolic (congestive) and diastolic (congestive) heart failure (HCC)   . Nonischemic cardiomyopathy (HCC)   . Morbid obesity due to excess calories (HCC)   . Noncompliance with diet and medication regimen 12/02/2015  . Acute on chronic systolic congestive heart failure (HCC)   . Acute diastolic CHF (congestive heart failure) (HCC) 11/18/2015  . Acute renal failure superimposed on stage 3 chronic kidney disease (HCC) 10/28/2015  . Acute on chronic combined systolic and diastolic heart failure (HCC) 10/08/2015  . OSA (obstructive sleep apnea) 09/20/2015  . Pulmonary hypertension (HCC) 09/19/2015  . Leukopenia 09/19/2015  . Normocytic anemia 09/19/2015  . Chronic atrial fibrillation (HCC)   . Dyspnea on exertion 05/28/2015  . Acute respiratory failure with hypoxia (HCC) 04/01/2015  . Pneumonia 04/01/2015  . Hypokalemia 04/01/2015  . Obesity hypoventilation syndrome (HCC) 08/08/2014  . GERD (gastroesophageal reflux disease) 08/08/2014  . Noncompliance with medication regimen 06/28/2014  . Edema of right lower extremity 06/21/2014  . Fecal urgency 06/21/2014  . Asthma 02/11/2009  . Hyperlipidemia 07/12/2008  . OBESITY, MORBID 07/12/2008  . Anxiety state 07/12/2008  . DEPRESSION 07/12/2008  . Essential hypertension 07/12/2008  . ALLERGIC RHINITIS 07/12/2008  . GERD 07/12/2008  . Peptic ulcer 07/12/2008  . OSTEOARTHRITIS 07/12/2008    Past Surgical History:  Procedure Laterality Date  . CARDIAC CATHETERIZATION N/A 06/14/2015   Procedure: Right/Left Heart Cath and Coronary Angiography;  Surgeon:  Runell Gess, MD;  Location: [facility];  Service: Cardiovascular;  Laterality: N/A;       Home Medications    Prior to Admission medications   Medication Sig Start Date End Date Taking? Authorizing Provider  atorvastatin (LIPITOR) 80 MG tablet Take 80 mg by mouth daily.   Yes Historical Provider, MD  beclomethasone  (QVAR) 80 MCG/ACT inhaler Inhale 2 puffs into the lungs daily as needed (shortness of breath). 11/21/15  Yes Erick Blinks, MD  furosemide (LASIX) 80 MG tablet Take 1 tablet (80 mg total) by mouth 2 (two) times daily. 12/02/15  Yes Jodelle Gross, NP  potassium chloride SA (K-DUR,KLOR-CON) 20 MEQ tablet Take 2 tablets 2 times daily for 7 days and then 1 tablet 2 times daily thereafter. Patient taking differently: Take 20 mEq by mouth daily. Take 2 tablets 2 times daily for 7 days and then 1 tablet 2 times daily thereafter. 01/09/16  Yes Elliot Cousin, MD  rivaroxaban (XARELTO) 20 MG TABS tablet Take 1 tablet (20 mg total) by mouth every evening. Patient taking differently: Take 20 mg by mouth every morning.  11/21/15  Yes Erick Blinks, MD  lisinopril (PRINIVIL,ZESTRIL) 5 MG tablet Take 1 tablet (5 mg total) by mouth daily. 01/17/16   Glynn Octave, MD  metoprolol succinate (TOPROL-XL) 100 MG 24 hr tablet Take 1 tablet (100 mg total) by mouth 2 (two) times daily. Take with or immediately following a meal. 01/17/16   Glynn Octave, MD    Family History Family History  Problem Relation Age of Onset  . Stroke Father   . Heart attack Father   . Aneurysm Mother     Cerebral aneurysm  . Hypertension Sister   . Colon cancer Neg Hx   . Inflammatory bowel disease Neg Hx   . Liver disease Neg Hx     Social History Social History  Substance Use Topics  . Smoking status: Former Smoker    Packs/day: 0.50    Years: 20.00    Types: Cigarettes    Start date: 04/26/1988    Quit date: 06/23/2007  . Smokeless tobacco: Never Used     Comment: 1 ppd former smoker  . Alcohol use No     Comment: No etoh since 2009     Allergies   Bee venom; Lipitor [atorvastatin]; Aspirin; Banana; Diltiazem; and Hydralazine   Review of Systems Review of Systems  Constitutional: Positive for activity change, fatigue and unexpected weight change.  Respiratory: Positive for shortness of breath.   Cardiovascular:  Positive for leg swelling. Negative for chest pain.  Gastrointestinal: Positive for abdominal distention. Negative for abdominal pain.  All other systems reviewed and are negative.  Physical Exam Updated Vital Signs BP 168/78 (BP Location: Left Arm)   Pulse 98   Temp 98.3 F (36.8 C) (Oral)   Resp 16   Ht 6' (1.829 m)   Wt 299 lb (135.6 kg)   SpO2 100%   BMI 40.55 kg/m    Physical Exam  Constitutional: He is oriented to person, place, and time. He appears well-developed and well-nourished. No distress.  Somnolent and difficult to arouse with slurred speech  HENT:  Head: Normocephalic and atraumatic.  Mouth/Throat: Oropharynx is clear and moist. No oropharyngeal exudate.  Eyes: Conjunctivae and EOM are normal. Pupils are equal, round, and reactive to light.  Neck: Normal range of motion. Neck supple.  No meningismus.  Cardiovascular: Regular rhythm, normal heart sounds and intact distal pulses.  Tachycardia present.  No murmur heard. Irregular tachycardia   Pulmonary/Chest: He is in respiratory distress.  Decreased breath sounds at bases  Abdominal: Soft. There is no tenderness. There is no rebound and no guarding.  Musculoskeletal: Normal range of motion. He exhibits edema. He exhibits no tenderness.  +3 edema to bilateral knees  Neurological: He is alert and oriented to person, place, and time. No cranial nerve deficit. He exhibits normal muscle tone. Coordination normal.   5/5 strength throughout. CN 2-12 intact.Equal grip strength.   Skin: Skin is warm.  Psychiatric: He has a normal mood and affect. His behavior is normal.  Nursing note and vitals reviewed.   ED Treatments / Results  Labs (all labs ordered are listed, but only abnormal results are displayed) Labs Reviewed  CBC WITH DIFFERENTIAL/PLATELET - Abnormal; Notable for the following:       Result Value   RBC 4.04 (*)    Hemoglobin 10.9 (*)    HCT 33.7 (*)    All other components within normal limits    COMPREHENSIVE METABOLIC PANEL - Abnormal; Notable for the following:    Chloride 113 (*)    Calcium 8.2 (*)    Total Bilirubin 1.5 (*)    Anion gap 3 (*)    All other components within normal limits  BLOOD GAS, ARTERIAL - Abnormal; Notable for the following:    pH, Arterial 7.457 (*)    pCO2 arterial 33.0 (*)    pO2, Arterial 65.3 (*)    Bicarbonate 24.3 (*)    All other components within normal limits  BRAIN NATRIURETIC PEPTIDE - Abnormal; Notable for the following:    B Natriuretic Peptide 934.0 (*)    All other components within normal limits  TROPONIN I - Abnormal; Notable for the following:    Troponin I 0.03 (*)    All other components within normal limits  URINALYSIS, ROUTINE W REFLEX MICROSCOPIC (NOT AT [facility]) - Abnormal; Notable for the following:    Hgb urine dipstick TRACE (*)    Protein, ur 100 (*)    All other components within normal limits  TROPONIN I - Abnormal; Notable for the following:    Troponin I 0.03 (*)    All other components within normal limits  URINE RAPID DRUG SCREEN, HOSP PERFORMED  URINE MICROSCOPIC-ADD ON    EKG  EKG Interpretation  Date/Time:  Friday January 17 2016 09:03:12 EDT Ventricular Rate:  111 PR Interval:    QRS Duration: 90 QT Interval:  346 QTC Calculation: 421 R Axis:   63 Text Interpretation:  Atrial fibrillation Ventricular premature complex Borderline repolarization abnormality No significant change was found Confirmed by Manus Gunning  MD, Lateria Alderman [phone]) on 01/17/2016 9:50:32 AM       Radiology Dg Chest 2 View  Result Date: 01/17/2016 CLINICAL DATA:  Shortness of breath with lower extremity edema EXAM: CHEST  2 VIEW COMPARISON:  January 05, 2016 FINDINGS: There is a small focus of opacity in the right upper lobe near a monitor lead, concerning for a small focus of pneumonia. There is persistent cardiomegaly with pulmonary venous hypertension. There is no appreciable pulmonary edema or pleural effusion. No adenopathy. There is  degenerative change in the thoracic spine. IMPRESSION: Small focus of increased opacity in the periphery of the right upper lobe, concerning for a small focus of pneumonia. A monitor lead in this area potentially may be contributing to the increased opacity in this area. On subsequent evaluation, monitor lead ideally should be placed elsewhere to more precisely  assess this area. There is underlying pulmonary vascular congestion without edema or pleural effusions evident. Electronically Signed   By: Bretta Bang III M.D.   On: 01/17/2016 09:57  Ct Head Wo Contrast  Result Date: 01/17/2016 CLINICAL DATA:  Shortness of breath, dizziness and altered mental status for 3 days. Initial encounter. EXAM: CT HEAD WITHOUT CONTRAST TECHNIQUE: Contiguous axial images were obtained from the base of the skull through the vertex without intravenous contrast. COMPARISON:  None. FINDINGS: The brain appears normal without hemorrhage, infarct, mass lesion, mass effect, midline shift or abnormal extra-axial fluid collection. No hydrocephalus or pneumocephalus. The calvarium is intact. Mucous retention cyst or polyp left maxillary sinus is identified. IMPRESSION: No acute intracranial abnormality. Mucous retention cyst or polyp left maxillary sinus. Electronically Signed   By: Drusilla Kanner M.D.   On: 01/17/2016 10:37   Procedures Procedures  DIAGNOSTIC STUDIES: Oxygen Saturation is 95% on RA, normal by my interpretation.    COORDINATION OF CARE: 9:40 AM Discussed next steps with pt. Pt verbalized understanding and is agreeable with the plan.   12:44 PM Reevaluated pt who denies any current CP. Pt states he ambulated without difficulty, dyspnea, dizziness and lightheadedness. He denies any coughing or fevers. Pt states he feels better. Discussed next steps with pt and he is agreeable with the plan.    Medications Ordered in ED Medications  furosemide (LASIX) injection 40 mg (40 mg Intravenous Given 01/17/16  0959)     Initial Impression / Assessment and Plan / ED Course  I have reviewed the triage vital signs and the nursing notes.  Pertinent labs & imaging results that were available during my care of the patient were reviewed by me and considered in my medical decision making (see chart for details).  Clinical Course   Hx CHF noncompliance presenting with worsening SOB and leg swelling.  Appears sleepy and more somnolent today than usual.  EKG unchanged. Edema on CXR, possible small area of PNA but no cough or fever.  AMS workup with no CO2 retention and normal CT head.   Normal coronaries by cath in December,.  IV lasix given. Patient tolerating ambulatory without desaturation.  Denies chest pain. Minimal troponin elevation as previously. Doubt ACS with recent reassuring cath. troponins likely chronically elevated from ACS.  Patient feels better after diuresis. Mental status has returned to baseline.  Patient denies any drug use. No chest pain.  He is ambulatory without desaturation and wants to go home.  followup with PCP. Return precautions discussed. BP meds refilled.  BP 168/78 (BP Location: Left Arm)   Pulse 98   Temp 98.3 F (36.8 C) (Oral)   Resp 16   Ht 6' (1.829 m)   Wt 299 lb (135.6 kg)   SpO2 100%   BMI 40.55 kg/m    I personally performed the services described in this documentation, which was scribed in my presence. The recorded information has been reviewed and is accurate.   Final Clinical Impressions(s) / ED Diagnoses   Final diagnoses:  Acute on chronic systolic CHF (congestive heart failure) [facility])    New Prescriptions Discharge Medication List as of 01/17/2016  4:10 PM       Glynn Octave, MD 01/17/16 1753

## 2016-01-17 NOTE — ED Notes (Signed)
Patient ambulated with pulse oximetry. Oxygen saturation 93-95% on room air with ambulation, heart rate 120. Patient states he feels better now that he has received the lasix and urinated.

## 2016-01-17 NOTE — ED Triage Notes (Signed)
Pt comes in for shortness of breath starting 3 days ago. Pt is a CHF patient with last hospitalization on 01/05/16. Pt states he has been taking medication as prescribed but states "it doesn't work and makes my heart feel different when I take it."  Pt denies any pain. Pt has leg swelling.

## 2016-01-17 NOTE — ED Notes (Signed)
CRITICAL VALUE ALERT  Critical value received:  Troponin 0.03  Date of notification:  01/17/16  Time of notification:  1018  Critical value read back:Yes.    Nurse who received alert:  RMinter, RN  MD notified (1st page):  Dr. Manus Gunning  Time of first page:  1019  MD notified (2nd page):  Time of second page:  Responding MD:  Dr. Manus Gunning  Time MD responded:  1019

## 2016-01-17 NOTE — ED Notes (Signed)
Respiratory made aware of ABG order. 

## 2016-01-20 ENCOUNTER — Inpatient Hospital Stay (HOSPITAL_COMMUNITY)
Admission: EM | Admit: 2016-01-20 | Discharge: 2016-01-25 | DRG: 291 | Discharge status: Against Medical Advice | Payer: Medicare Other | Attending: Internal Medicine | Admitting: Internal Medicine

## 2016-01-20 ENCOUNTER — Emergency Department (HOSPITAL_COMMUNITY): Payer: Medicare Other

## 2016-01-20 ENCOUNTER — Encounter (HOSPITAL_COMMUNITY): Payer: Self-pay | Admitting: *Deleted

## 2016-01-20 DIAGNOSIS — E785 Hyperlipidemia, unspecified: Secondary | ICD-10-CM | POA: Diagnosis present

## 2016-01-20 DIAGNOSIS — N183 Chronic kidney disease, stage 3 unspecified: Secondary | ICD-10-CM | POA: Diagnosis present

## 2016-01-20 DIAGNOSIS — K219 Gastro-esophageal reflux disease without esophagitis: Secondary | ICD-10-CM | POA: Diagnosis present

## 2016-01-20 DIAGNOSIS — I272 Other secondary pulmonary hypertension: Secondary | ICD-10-CM | POA: Diagnosis present

## 2016-01-20 DIAGNOSIS — I130 Hypertensive heart and chronic kidney disease with heart failure and stage 1 through stage 4 chronic kidney disease, or unspecified chronic kidney disease: Principal | ICD-10-CM | POA: Diagnosis present

## 2016-01-20 DIAGNOSIS — I10 Essential (primary) hypertension: Secondary | ICD-10-CM | POA: Diagnosis present

## 2016-01-20 DIAGNOSIS — G4733 Obstructive sleep apnea (adult) (pediatric): Secondary | ICD-10-CM | POA: Diagnosis present

## 2016-01-20 DIAGNOSIS — Z9111 Patient's noncompliance with dietary regimen: Secondary | ICD-10-CM

## 2016-01-20 DIAGNOSIS — Z9119 Patient's noncompliance with other medical treatment and regimen: Secondary | ICD-10-CM

## 2016-01-20 DIAGNOSIS — E876 Hypokalemia: Secondary | ICD-10-CM | POA: Diagnosis present

## 2016-01-20 DIAGNOSIS — Z8711 Personal history of peptic ulcer disease: Secondary | ICD-10-CM

## 2016-01-20 DIAGNOSIS — I5043 Acute on chronic combined systolic (congestive) and diastolic (congestive) heart failure: Secondary | ICD-10-CM | POA: Diagnosis present

## 2016-01-20 DIAGNOSIS — J45909 Unspecified asthma, uncomplicated: Secondary | ICD-10-CM | POA: Diagnosis present

## 2016-01-20 DIAGNOSIS — R0602 Shortness of breath: Secondary | ICD-10-CM | POA: Diagnosis not present

## 2016-01-20 DIAGNOSIS — I428 Other cardiomyopathies: Secondary | ICD-10-CM

## 2016-01-20 DIAGNOSIS — Z9114 Patient's other noncompliance with medication regimen: Secondary | ICD-10-CM

## 2016-01-20 DIAGNOSIS — I4891 Unspecified atrial fibrillation: Secondary | ICD-10-CM

## 2016-01-20 DIAGNOSIS — Z0389 Encounter for observation for other suspected diseases and conditions ruled out: Secondary | ICD-10-CM

## 2016-01-20 DIAGNOSIS — N179 Acute kidney failure, unspecified: Secondary | ICD-10-CM | POA: Diagnosis present

## 2016-01-20 DIAGNOSIS — I472 Ventricular tachycardia: Secondary | ICD-10-CM | POA: Diagnosis present

## 2016-01-20 DIAGNOSIS — I5023 Acute on chronic systolic (congestive) heart failure: Secondary | ICD-10-CM | POA: Diagnosis present

## 2016-01-20 DIAGNOSIS — I482 Chronic atrial fibrillation, unspecified: Secondary | ICD-10-CM | POA: Diagnosis present

## 2016-01-20 DIAGNOSIS — Z6841 Body Mass Index (BMI) 40.0 and over, adult: Secondary | ICD-10-CM

## 2016-01-20 DIAGNOSIS — I4729 Other ventricular tachycardia: Secondary | ICD-10-CM

## 2016-01-20 DIAGNOSIS — Z91199 Patient's noncompliance with other medical treatment and regimen due to unspecified reason: Secondary | ICD-10-CM

## 2016-01-20 DIAGNOSIS — Z87891 Personal history of nicotine dependence: Secondary | ICD-10-CM

## 2016-01-20 DIAGNOSIS — Z91148 Patient's other noncompliance with medication regimen for other reason: Secondary | ICD-10-CM

## 2016-01-20 DIAGNOSIS — R112 Nausea with vomiting, unspecified: Secondary | ICD-10-CM | POA: Diagnosis not present

## 2016-01-20 DIAGNOSIS — I509 Heart failure, unspecified: Secondary | ICD-10-CM

## 2016-01-20 DIAGNOSIS — E66813 Obesity, class 3: Secondary | ICD-10-CM | POA: Diagnosis present

## 2016-01-20 DIAGNOSIS — Z8249 Family history of ischemic heart disease and other diseases of the circulatory system: Secondary | ICD-10-CM

## 2016-01-20 DIAGNOSIS — Z823 Family history of stroke: Secondary | ICD-10-CM

## 2016-01-20 DIAGNOSIS — N2889 Other specified disorders of kidney and ureter: Secondary | ICD-10-CM | POA: Diagnosis present

## 2016-01-20 DIAGNOSIS — Z79899 Other long term (current) drug therapy: Secondary | ICD-10-CM

## 2016-01-20 DIAGNOSIS — IMO0001 Reserved for inherently not codable concepts without codable children: Secondary | ICD-10-CM

## 2016-01-20 DIAGNOSIS — I429 Cardiomyopathy, unspecified: Secondary | ICD-10-CM | POA: Diagnosis present

## 2016-01-20 DIAGNOSIS — Z7901 Long term (current) use of anticoagulants: Secondary | ICD-10-CM

## 2016-01-20 DIAGNOSIS — T502X5A Adverse effect of carbonic-anhydrase inhibitors, benzothiadiazides and other diuretics, initial encounter: Secondary | ICD-10-CM | POA: Diagnosis present

## 2016-01-20 LAB — CBC WITH DIFFERENTIAL/PLATELET
Basophils Absolute: 0 10*3/uL (ref 0.0–0.1)
Basophils Relative: 0 %
Eosinophils Absolute: 0.1 10*3/uL (ref 0.0–0.7)
Eosinophils Relative: 3 %
HEMATOCRIT: 32.6 % — AB (ref 39.0–52.0)
Hemoglobin: 10.5 g/dL — ABNORMAL LOW (ref 13.0–17.0)
LYMPHS PCT: 25 %
Lymphs Abs: 1 10*3/uL (ref 0.7–4.0)
MCH: 26.9 pg (ref 26.0–34.0)
MCHC: 32.2 g/dL (ref 30.0–36.0)
MCV: 83.6 fL (ref 78.0–100.0)
MONO ABS: 0.3 10*3/uL (ref 0.1–1.0)
MONOS PCT: 6 %
NEUTROS ABS: 2.8 10*3/uL (ref 1.7–7.7)
Neutrophils Relative %: 66 %
Platelets: 181 10*3/uL (ref 150–400)
RBC: 3.9 MIL/uL — ABNORMAL LOW (ref 4.22–5.81)
RDW: 15.8 % — AB (ref 11.5–15.5)
WBC: 4.2 10*3/uL (ref 4.0–10.5)

## 2016-01-20 LAB — BASIC METABOLIC PANEL
Anion gap: 4 — ABNORMAL LOW (ref 5–15)
BUN: 18 mg/dL (ref 6–20)
CHLORIDE: 111 mmol/L (ref 101–111)
CO2: 22 mmol/L (ref 22–32)
CREATININE: 0.97 mg/dL (ref 0.61–1.24)
Calcium: 8.4 mg/dL — ABNORMAL LOW (ref 8.9–10.3)
GFR calc Af Amer: >60 mL/min (ref >60)
GFR calc non Af Amer: >60 mL/min (ref >60)
GLUCOSE: 90 mg/dL (ref 65–99)
Potassium: 3.4 mmol/L — ABNORMAL LOW (ref 3.5–5.1)
Sodium: 137 mmol/L (ref 135–145)

## 2016-01-20 LAB — BLOOD GAS, ARTERIAL
Acid-Base Excess: 0.2 mmol/L (ref 0.0–2.0)
BICARBONATE: 24.6 meq/L — AB (ref 20.0–24.0)
DRAWN BY: 234301
FIO2: 21
O2 Saturation: 82.9 %
PH ART: 7.447 (ref 7.350–7.450)
PO2 ART: 51.9 mmHg — AB (ref 80.0–100.0)
Patient temperature: 37
pCO2 arterial: 35.1 mmHg (ref 35.0–45.0)

## 2016-01-20 LAB — BRAIN NATRIURETIC PEPTIDE: B Natriuretic Peptide: 1039 pg/mL — ABNORMAL HIGH (ref 0.0–100.0)

## 2016-01-20 LAB — TROPONIN I: Troponin I: 0.03 ng/mL (ref <0.03)

## 2016-01-20 MED ORDER — METOPROLOL TARTRATE 5 MG/5ML IV SOLN
5.0000 mg | Freq: Once | INTRAVENOUS | Status: AC
  Administered 2016-01-20: 5 mg via INTRAVENOUS
  Filled 2016-01-20: qty 5

## 2016-01-20 MED ORDER — FUROSEMIDE 10 MG/ML IJ SOLN
40.0000 mg | Freq: Once | INTRAMUSCULAR | Status: AC
  Administered 2016-01-20: 40 mg via INTRAVENOUS
  Filled 2016-01-20: qty 4

## 2016-01-20 MED ORDER — POTASSIUM CHLORIDE CRYS ER 20 MEQ PO TBCR
40.0000 meq | EXTENDED_RELEASE_TABLET | Freq: Once | ORAL | Status: AC
  Administered 2016-01-20: 40 meq via ORAL
  Filled 2016-01-20: qty 2

## 2016-01-20 NOTE — ED Notes (Signed)
CRITICAL VALUE ALERT  Critical value received: Troponin 0.03  Date of notification: 01/20/16  Time of notification: 1959  Critical value read back:Yes.    Nurse who received alert:  Rudene Anda, RN  MD notified :  Dr. Clarene Duke  Time of first page: [phone]

## 2016-01-20 NOTE — ED Notes (Signed)
Pt oxygen 87% on RA. Pt placed on 2L

## 2016-01-20 NOTE — ED Notes (Signed)
Lab and respiratory at bedside.

## 2016-01-20 NOTE — ED Provider Notes (Signed)
[facility]-EM[facility]ENCY DEPT Provider Note   CSN: [id number] Arrival date & time: 01/20/16  4098  First Provider Contact:  None       History   Chief Complaint Chief Complaint  Patient presents with  . Shortness of Breath    HPI [name] is a 50 y.o. male.  HPI  Pt was seen at 1830. Per pt, c/o gradual onset and persistence of constant SOB and pedal edema for the past 3 days. States he was seen in the ED 3 days ago for these symptoms and felt better after being given lasix. Pt does not take his lasix at home "because it doesn't work," and has not been taking "some" his other meds also.  Endorses compliance with his xarelto. Denies CP/palpitations, no cough, no abd pain, no N/V/D, no fevers. The symptoms have been associated with no other complaints. The patient has a significant history of similar symptoms previously, recently being evaluated for this complaint and multiple prior evals for same.     Past Medical History:  Diagnosis Date  . Allergic rhinitis   . Asthma   . Atrial fibrillation (HCC)   . Essential hypertension   . Gastroesophageal reflux disease   . History of cardiac catheterization    Normal coronaries December 2016  . Noncompliance    Major problem leading to declining health and recurrent hospitalization  . Nonischemic cardiomyopathy (HCC)    LVEF 20-25%  . OSA (obstructive sleep apnea)   . Osteoarthritis   . Peptic ulcer disease     Patient Active Problem List   Diagnosis Date Noted  . Acute systolic CHF (congestive heart failure) (HCC) 12/21/2015  . Acute on chronic combined systolic and diastolic CHF (congestive heart failure) (HCC) 12/21/2015  . Elevated troponin 12/21/2015  . Congestive heart failure (CHF) (HCC) 12/03/2015  . Acute on chronic combined systolic (congestive) and diastolic (congestive) heart failure (HCC)   . Nonischemic cardiomyopathy (HCC)   . Morbid obesity due to excess calories (HCC)   . Noncompliance with diet and medication  regimen 12/02/2015  . Acute on chronic systolic congestive heart failure (HCC)   . Acute diastolic CHF (congestive heart failure) (HCC) 11/18/2015  . Acute renal failure superimposed on stage 3 chronic kidney disease (HCC) 10/28/2015  . Acute on chronic combined systolic and diastolic heart failure (HCC) 10/08/2015  . OSA (obstructive sleep apnea) 09/20/2015  . Pulmonary hypertension (HCC) 09/19/2015  . Leukopenia 09/19/2015  . Normocytic anemia 09/19/2015  . Chronic atrial fibrillation (HCC)   . Dyspnea on exertion 05/28/2015  . Acute respiratory failure with hypoxia (HCC) 04/01/2015  . Pneumonia 04/01/2015  . Hypokalemia 04/01/2015  . Obesity hypoventilation syndrome (HCC) 08/08/2014  . GERD (gastroesophageal reflux disease) 08/08/2014  . Noncompliance with medication regimen 06/28/2014  . Edema of right lower extremity 06/21/2014  . Fecal urgency 06/21/2014  . Asthma 02/11/2009  . Hyperlipidemia 07/12/2008  . OBESITY, MORBID 07/12/2008  . Anxiety state 07/12/2008  . DEPRESSION 07/12/2008  . Essential hypertension 07/12/2008  . ALLERGIC RHINITIS 07/12/2008  . GERD 07/12/2008  . Peptic ulcer 07/12/2008  . OSTEOARTHRITIS 07/12/2008    Past Surgical History:  Procedure Laterality Date  . CARDIAC CATHETERIZATION N/A 06/14/2015   Procedure: Right/Left Heart Cath and Coronary Angiography;  Surgeon: Runell Gess, MD;  Location: [facility] INVASIVE CV LAB;  Service: Cardiovascular;  Laterality: N/A;       Home Medications    Prior to Admission medications   Medication Sig Start Date End Date Taking? Authorizing  Provider  atorvastatin (LIPITOR) 80 MG tablet Take 80 mg by mouth daily.    Historical Provider, MD  beclomethasone (QVAR) 80 MCG/ACT inhaler Inhale 2 puffs into the lungs daily as needed (shortness of breath). 11/21/15   Erick Blinks, MD  furosemide (LASIX) 80 MG tablet Take 1 tablet (80 mg total) by mouth 2 (two) times daily. 12/02/15   Jodelle Gross, NP  lisinopril  (PRINIVIL,ZESTRIL) 5 MG tablet Take 1 tablet (5 mg total) by mouth daily. 01/17/16   Glynn Octave, MD  metoprolol succinate (TOPROL-XL) 100 MG 24 hr tablet Take 1 tablet (100 mg total) by mouth 2 (two) times daily. Take with or immediately following a meal. 01/17/16   Glynn Octave, MD  potassium chloride SA (K-DUR,KLOR-CON) 20 MEQ tablet Take 2 tablets 2 times daily for 7 days and then 1 tablet 2 times daily thereafter. Patient taking differently: Take 20 mEq by mouth daily. Take 2 tablets 2 times daily for 7 days and then 1 tablet 2 times daily thereafter. 01/09/16   Elliot Cousin, MD  rivaroxaban (XARELTO) 20 MG TABS tablet Take 1 tablet (20 mg total) by mouth every evening. Patient taking differently: Take 20 mg by mouth every morning.  11/21/15   Erick Blinks, MD    Family History Family History  Problem Relation Age of Onset  . Stroke Father   . Heart attack Father   . Aneurysm Mother     Cerebral aneurysm  . Hypertension Sister   . Colon cancer Neg Hx   . Inflammatory bowel disease Neg Hx   . Liver disease Neg Hx     Social History Social History  Substance Use Topics  . Smoking status: Former Smoker    Packs/day: 0.50    Years: 20.00    Types: Cigarettes    Start date: 04/26/1988    Quit date: 06/23/2007  . Smokeless tobacco: Never Used     Comment: 1 ppd former smoker  . Alcohol use No     Comment: No etoh since 2009     Allergies   Bee venom; Lipitor [atorvastatin]; Aspirin; Banana; Diltiazem; and Hydralazine   Review of Systems Review of Systems ROS: Statement: All systems negative except as marked or noted in the HPI; Constitutional: Negative for fever and chills. ; ; Eyes: Negative for eye pain, redness and discharge. ; ; ENMT: Negative for ear pain, hoarseness, nasal congestion, sinus pressure and sore throat. ; ; Cardiovascular: Negative for chest pain, palpitations, diaphoresis, +dyspnea and peripheral edema. ; ; Respiratory: Negative for cough, wheezing and  stridor. ; ; Gastrointestinal: Negative for nausea, vomiting, diarrhea, abdominal pain, blood in stool, hematemesis, jaundice and rectal bleeding. . ; ; Genitourinary: Negative for dysuria, flank pain and hematuria. ; ; Musculoskeletal: Negative for back pain and neck pain. Negative for swelling and trauma.; ; Skin: Negative for pruritus, rash, abrasions, blisters, bruising and skin lesion.; ; Neuro: Negative for headache, lightheadedness and neck stiffness. Negative for weakness, altered level of consciousness, altered mental status, extremity weakness, paresthesias, involuntary movement, seizure and syncope.      Physical Exam Updated Vital Signs BP (!) 151/117   Pulse 84   Temp 98.2 F (36.8 C) (Oral)   Resp 15   Ht 6' (1.829 m)   Wt 299 lb (135.6 kg)   SpO2 (!) 89%   BMI 40.55 kg/m   Physical Exam 1835: Physical examination:  Nursing notes reviewed; Vital signs and O2 SAT reviewed;  Constitutional: Well developed, Well nourished, Well hydrated,  In no acute distress; Head:  Normocephalic, atraumatic; Eyes: EOMI, PERRL, No scleral icterus; ENMT: Mouth and pharynx normal, Mucous membranes moist; Neck: Supple, Full range of motion, No lymphadenopathy; Cardiovascular: Irregular rate and rhythm, No gallop; Respiratory: Breath sounds coarse & equal bilaterally, No wheezes. Speaking full sentences with ease, Normal respiratory effort/excursion; Chest: Nontender, Movement normal; Abdomen: Soft, Nontender, Nondistended, Normal bowel sounds; Genitourinary: No CVA tenderness; Extremities: Pulses normal, No tenderness, +3 pedal edema bilat..; Neuro: Lethargic, but will open eyes and answer questions when asked. No facial droop. Speech clear. No gross focal motor deficits in extremities.; Skin: Color normal, Warm, Dry.    ED Treatments / Results  Labs (all labs ordered are listed, but only abnormal results are displayed)   EKG  EKG Interpretation  Date/Time:  Monday January 20 2016 18:19:41  EDT Ventricular Rate:  99 PR Interval:    QRS Duration: 94 QT Interval:  375 QTC Calculation: 482 R Axis:   66 Text Interpretation:  Atrial fibrillation Ventricular premature complex Borderline repolarization abnormality Borderline prolonged QT interval When compared with ECG of 01/17/2016 No significant change was found Confirmed by [facility]  MD, Nicholos Johns [phone]) on 01/20/2016 7:32:31 PM       Radiology   Procedures Procedures (including critical care time)  Medications Ordered in ED Medications  furosemide (LASIX) injection 40 mg (40 mg Intravenous Given 01/20/16 1848)     Initial Impression / Assessment and Plan / ED Course  I have reviewed the triage vital signs and the nursing notes.  Pertinent labs & imaging results that were available during my care of the patient were reviewed by me and considered in my medical decision making (see chart for details).  MDM Reviewed: vitals, nursing note and previous chart Reviewed previous: labs and ECG Interpretation: labs, ECG and x-ray    Results for orders placed or performed during the hospital encounter of 01/20/16  Basic metabolic panel  Result Value Ref Range   Sodium 137 135 - 145 mmol/L   Potassium 3.4 (L) 3.5 - 5.1 mmol/L   Chloride 111 101 - 111 mmol/L   CO2 22 22 - 32 mmol/L   Glucose, Bld 90 65 - 99 mg/dL   BUN 18 6 - 20 mg/dL   Creatinine, Ser 3.76 0.61 - 1.24 mg/dL   Calcium 8.4 (L) 8.9 - 10.3 mg/dL   GFR calc non Af Amer >60 >60 mL/min   GFR calc Af Amer >60 >60 mL/min   Anion gap 4 (L) 5 - 15  Brain natriuretic peptide  Result Value Ref Range   B Natriuretic Peptide 1,039.0 (H) 0.0 - 100.0 pg/mL  Troponin I  Result Value Ref Range   Troponin I 0.03 (HH) <0.03 ng/mL  CBC with Differential  Result Value Ref Range   WBC 4.2 4.0 - 10.5 K/uL   RBC 3.90 (L) 4.22 - 5.81 MIL/uL   Hemoglobin 10.5 (L) 13.0 - 17.0 g/dL   HCT 28.3 (L) 15.1 - 76.1 %   MCV 83.6 78.0 - 100.0 fL   MCH 26.9 26.0 - 34.0 pg   MCHC  32.2 30.0 - 36.0 g/dL   RDW 60.7 (H) 37.1 - 06.2 %   Platelets 181 150 - 400 K/uL   Neutrophils Relative % 66 %   Neutro Abs 2.8 1.7 - 7.7 K/uL   Lymphocytes Relative 25 %   Lymphs Abs 1.0 0.7 - 4.0 K/uL   Monocytes Relative 6 %   Monocytes Absolute 0.3 0.1 - 1.0 K/uL  Eosinophils Relative 3 %   Eosinophils Absolute 0.1 0.0 - 0.7 K/uL   Basophils Relative 0 %   Basophils Absolute 0.0 0.0 - 0.1 K/uL  Blood gas, arterial  Result Value Ref Range   FIO2 21.00    Delivery systems ROOM AIR    pH, Arterial 7.447 7.350 - 7.450   pCO2 arterial 35.1 35.0 - 45.0 mmHg   pO2, Arterial 51.9 (L) 80.0 - 100.0 mmHg   Bicarbonate 24.6 (H) 20.0 - 24.0 mEq/L   Acid-Base Excess 0.2 0.0 - 2.0 mmol/L   O2 Saturation 82.9 %   Patient temperature 37.0    Collection site RIGHT RADIAL    Drawn by [id number]    Sample type ARTERIAL DRAW    Allens test (pass/fail) PASS PASS   Dg Chest 2 View Result Date: 01/20/2016 CLINICAL DATA:  50 year old male with shortness of breath, chest pain, cough, nausea and fever for 2 days. Initial encounter. EXAM: CHEST  2 VIEW COMPARISON:  01/17/2016 and earlier. FINDINGS: Continued indistinct appearance of pulmonary vasculature throughout. Stable cardiomegaly and mediastinal contours. Small volume fluid tracking in the major fissures. No other layering pleural effusion. No pneumothorax. No confluent opacity other than crowding of markings at both lung bases today - and the previously questioned right upper lobe opacity has resolved. Visualized tracheal air column is within normal limits. No acute osseous abnormality identified. IMPRESSION: Appearance most compatible with pulmonary edema with small volume pleural fluid. Electronically Signed   By: Odessa Fleming M.D.   On: 01/20/2016 20:04    2045:  IV lasix given on arrival. O2 applied. Mental status improved. Pt now sitting of side of stretcher. HR increasing to 160's when stands. Pt now admits he has not taken his metoprolol "in a few  days;" will dose IV lopressor now. Troponin per baseline. BNP higher than baseline and CHF on CXR; will admit. T/C to [facility]. Sharl Ma, case discussed, including:  HPI, pertinent PM/SHx, VS/PE, dx testing, ED course and treatment:  Agreeable to admit, requests to write temporary orders, obtain observation tele bed to team [facility]Admits.        Final Clinical Impressions(s) / ED Diagnoses   Final diagnoses:  None    New Prescriptions New Prescriptions   No medications on file     Samuel Jester, DO 01/22/16 1526

## 2016-01-20 NOTE — ED Notes (Signed)
Patient's heart rate is high 90's-160's at times while sitting still on stretcher.  MD notified.

## 2016-01-20 NOTE — H&P (Signed)
[facility] H&P   Patient Demographics:    [name], is a 50 y.o. male  MRN: [id number]  DOB - 26-Aug-1965  Admit Date - 01/20/2016  Outpatient Primary MD for the patient is Pearson Grippe, MD  Referring MD/NP/PA: Dr Lynelle Doctor  Patient coming from: Home  Chief Complaint  Patient presents with  . Shortness of Breath      HPI:    [name]  is a 50 y.o. male, History of nonischemic cardio myopathy, pulmonary hypertension, hypertension, obstructive sleep apnea, noncompliance with medications, chronic atrial fibrillation, anticoagulation with Xarelto, who came to the ED with worsening leg swelling and shortness of breath. He denies chest pain. No coughing up any phlegm. Had 2 episodes of nausea and vomiting yesterday. Denies diarrhea.     Review of systems:    In addition to the HPI above No Fever-chills, No Headache, No changes with Vision or hearing, No problems swallowing food or Liquids, No Abdominal pain, No Nausea or Vomiting, bowel movements are regular, No Blood in stool or Urine, No dysuria, No new skin rashes or bruises, No new joints pains-aches,  No new weakness, tingling, numbness in any extremity, No recent weight gain or loss, No polyuria, polydypsia or polyphagia, No significant Mental Stressors.  A full 10 point Review of Systems was done, except as stated above, all other Review of Systems were negative.   With Past History of the following :    Past Medical History:  Diagnosis Date  . Allergic rhinitis   . Asthma   . Atrial fibrillation (HCC)   . Essential hypertension   . Gastroesophageal reflux disease   . History of cardiac catheterization    Normal coronaries December 2016  . Noncompliance    Major problem leading to declining health and recurrent hospitalization  . Nonischemic cardiomyopathy (HCC)    LVEF 20-25%  . OSA (obstructive sleep apnea)   . Osteoarthritis   .  Peptic ulcer disease       Past Surgical History:  Procedure Laterality Date  . CARDIAC CATHETERIZATION N/A 06/14/2015   Procedure: Right/Left Heart Cath and Coronary Angiography;  Surgeon: Runell Gess, MD;  Location: [facility] INVASIVE CV LAB;  Service: Cardiovascular;  Laterality: N/A;      Social History:     Social History  Substance Use Topics  . Smoking status: Former Smoker    Packs/day: 0.50    Years: 20.00    Types: Cigarettes    Start date: 04/26/1988    Quit date: 06/23/2007  . Smokeless tobacco: Never Used     Comment: 1 ppd former smoker  . Alcohol use No     Comment: No etoh since 2009        Family History :     Family History  Problem Relation Age of Onset  . Stroke Father   . Heart attack Father   . Aneurysm Mother     Cerebral aneurysm  . Hypertension Sister   . Colon cancer Neg Hx   .  Inflammatory bowel disease Neg Hx   . Liver disease Neg Hx       Home Medications:   Prior to Admission medications   Medication Sig Start Date End Date Taking? Authorizing Provider  atorvastatin (LIPITOR) 80 MG tablet Take 80 mg by mouth daily.   Yes Historical Provider, MD  beclomethasone (QVAR) 80 MCG/ACT inhaler Inhale 2 puffs into the lungs daily as needed (shortness of breath). 11/21/15  Yes Erick Blinks, MD  furosemide (LASIX) 80 MG tablet Take 1 tablet (80 mg total) by mouth 2 (two) times daily. 12/02/15  Yes Jodelle Gross, NP  metoprolol succinate (TOPROL-XL) 100 MG 24 hr tablet Take 1 tablet (100 mg total) by mouth 2 (two) times daily. Take with or immediately following a meal. 01/17/16  Yes Glynn Octave, MD  potassium chloride SA (K-DUR,KLOR-CON) 20 MEQ tablet Take 2 tablets 2 times daily for 7 days and then 1 tablet 2 times daily thereafter. Patient taking differently: Take 20 mEq by mouth 2 (two) times daily.  01/09/16  Yes Elliot Cousin, MD  rivaroxaban (XARELTO) 20 MG TABS tablet Take 1 tablet (20 mg total) by mouth every evening. Patient taking  differently: Take 20 mg by mouth every morning.  11/21/15  Yes Erick Blinks, MD  lisinopril (PRINIVIL,ZESTRIL) 5 MG tablet Take 1 tablet (5 mg total) by mouth daily. 01/17/16   Glynn Octave, MD     Allergies:     Allergies  Allergen Reactions  . Bee Venom Shortness Of Breath and Swelling  . Lipitor [Atorvastatin] Shortness Of Breath and Other (See Comments)    Nose bleed  . Aspirin Other (See Comments)    Chewable 81 mg tablets upsets patients stomach  . Banana Diarrhea  . Diltiazem Other (See Comments)    Makes heart beat fast  . Hydralazine Other (See Comments)    Patient states it causes Tachycardia     Physical Exam:   Vitals  Blood pressure (!) 176/118, pulse 119, temperature 98.2 F (36.8 C), temperature source Oral, resp. rate 17, height 6' (1.829 m), weight 135.6 kg (299 lb), SpO2 96 %.   1. General african american male lying in bed in NAD, cooperative with exam  2. Normal affect and insight, Awake Alert, Oriented X 3.  3. No F.N deficits, ALL C.Nerves Intact, Strength 5/5 all 4 extremities, Sensation intact all 4 extremities, Plantars down going.  4. Ears and Eyes appear Normal, Conjunctivae clear, PERRLA. Moist Oral Mucosa.  5. Supple Neck, No JVD, No cervical lymphadenopathy appriciated, No Carotid Bruits.  6. Symmetrical Chest wall movement, Good air movement bilaterally, CTAB.  7. Irregular rhythm, No Gallops, Rubs or Murmurs, No Parasternal Heave. Bilateral 2+ edema of lower extremities.  8. Positive Bowel Sounds, Abdomen Soft, No tenderness, No organomegaly appriciated,No rebound -guarding or rigidity.  9.  No Cyanosis, Normal Skin Turgor, No Skin Rash or Bruise.  10. Good muscle tone,  joints appear normal , no effusions, Normal ROM.      Data Review:    CBC  Recent Labs Lab 01/17/16 0948 01/20/16 1851  WBC 4.0 4.2  HGB 10.9* 10.5*  HCT 33.7* 32.6*  PLT 167 181  MCV 83.4 83.6  MCH 27.0 26.9  MCHC 32.3 32.2  RDW 15.5 15.8*    LYMPHSABS 0.9 1.0  MONOABS 0.3 0.3  EOSABS 0.1 0.1  BASOSABS 0.0 0.0   ------------------------------------------------------------------------------------------------------------------  Chemistries   Recent Labs Lab 01/17/16 0948 01/20/16 1851  NA 139 137  K 3.5 3.4*  CL  113* 111  CO2 23 22  GLUCOSE 87 90  BUN 18 18  CREATININE 1.04 0.97  CALCIUM 8.2* 8.4*  AST 25  --   ALT 21  --   ALKPHOS 65  --   BILITOT 1.5*  --    ------------------------------------------------------------------------------------------------------------------  ------------------------------------------------------------------------------------------------------------------ GFR: Estimated Creatinine Clearance: 134.2 mL/min (by C-G formula based on SCr of 0.97 mg/dL). Liver Function Tests:  Recent Labs Lab 01/17/16 0948  AST 25  ALT 21  ALKPHOS 65  BILITOT 1.5*  PROT 6.5  ALBUMIN 3.5     Recent Labs Lab 01/17/16 0948 01/17/16 1303 01/20/16 1851  TROPONINI 0.03* 0.03* 0.03*   --------------------------------------------------------------------------------------------------------------- Urine analysis:    Component Value Date/Time   COLORURINE YELLOW 01/17/2016 1029   APPEARANCEUR CLEAR 01/17/2016 1029   LABSPEC 1.020 01/17/2016 1029   PHURINE 6.0 01/17/2016 1029   GLUCOSEU NEGATIVE 01/17/2016 1029   GLUCOSEU NEG mg/dL 16/03/9603 5409   HGBUR TRACE (A) 01/17/2016 1029   BILIRUBINUR NEGATIVE 01/17/2016 1029   KETONESUR NEGATIVE 01/17/2016 1029   PROTEINUR 100 (A) 01/17/2016 1029   UROBILINOGEN 0.2 08/08/2014 1445   NITRITE NEGATIVE 01/17/2016 1029   LEUKOCYTESUR NEGATIVE 01/17/2016 1029      ----------------------------------------------------------------------------------------------------------------   Imaging Results:    Dg Chest 2 View  Result Date: 01/20/2016 CLINICAL DATA:  50 year old male with shortness of breath, chest pain, cough, nausea and fever for 2  days. Initial encounter. EXAM: CHEST  2 VIEW COMPARISON:  01/17/2016 and earlier. FINDINGS: Continued indistinct appearance of pulmonary vasculature throughout. Stable cardiomegaly and mediastinal contours. Small volume fluid tracking in the major fissures. No other layering pleural effusion. No pneumothorax. No confluent opacity other than crowding of markings at both lung bases today - and the previously questioned right upper lobe opacity has resolved. Visualized tracheal air column is within normal limits. No acute osseous abnormality identified. IMPRESSION: Appearance most compatible with pulmonary edema with small volume pleural fluid. Electronically Signed   By: Odessa Fleming M.D.   On: 01/20/2016 20:04    My personal review of EKG: Afib with RVR   Assessment & Plan:    Active Problems:   Hypokalemia   Nonischemic cardiomyopathy (HCC)   CHF (congestive heart failure) (HCC)   CHF exacerbation (HCC)   1. CHF exacerbation- (acute combined systolic and diastolic CHF)- admit the patient in telemetry, start Lasix 40 mg IV every 12 hours. Monitor strict intake and output. 2. A. fib with RVR- patient has history of A. fib and today found to be in RVR, continue metoprolol 100 mg by mouth twice a day. Continue with anticoagulation Xarelto. 3. Mild elevation of troponin- patient has a history of chronically elevated troponin but is 0.03, no chest pain at this time so will not cycle troponin. 4. Hypertension- blood pressure is uncontrolled, continue metoprolol, lisinopril.   DVT Prophylaxis-   Xarelto  AM Labs Ordered, also please review Full Orders  Family Communication: no family at bedside  Code Status: Full code  Admission status: Observation  Time spent in minutes : 60 min   Leiani Enright S M.D on 01/20/2016 at 9:09 PM  Between 7am to 7pm - Pager - [phone]. After 7pm go to [email or web address] - password [email or web address]  [facility] - Office  [phone]

## 2016-01-20 NOTE — ED Triage Notes (Addendum)
Pt comes in with SOB that started several days ago.   Pt has hx of CHF. Was seen here 3 days ago for same and discharged.

## 2016-01-21 DIAGNOSIS — K219 Gastro-esophageal reflux disease without esophagitis: Secondary | ICD-10-CM | POA: Diagnosis present

## 2016-01-21 DIAGNOSIS — I5023 Acute on chronic systolic (congestive) heart failure: Secondary | ICD-10-CM | POA: Diagnosis not present

## 2016-01-21 DIAGNOSIS — T502X5A Adverse effect of carbonic-anhydrase inhibitors, benzothiadiazides and other diuretics, initial encounter: Secondary | ICD-10-CM | POA: Diagnosis present

## 2016-01-21 DIAGNOSIS — I130 Hypertensive heart and chronic kidney disease with heart failure and stage 1 through stage 4 chronic kidney disease, or unspecified chronic kidney disease: Secondary | ICD-10-CM | POA: Diagnosis present

## 2016-01-21 DIAGNOSIS — J45909 Unspecified asthma, uncomplicated: Secondary | ICD-10-CM | POA: Diagnosis present

## 2016-01-21 DIAGNOSIS — Z6841 Body Mass Index (BMI) 40.0 and over, adult: Secondary | ICD-10-CM | POA: Diagnosis not present

## 2016-01-21 DIAGNOSIS — I272 Other secondary pulmonary hypertension: Secondary | ICD-10-CM | POA: Diagnosis present

## 2016-01-21 DIAGNOSIS — Z823 Family history of stroke: Secondary | ICD-10-CM | POA: Diagnosis not present

## 2016-01-21 DIAGNOSIS — I472 Ventricular tachycardia: Secondary | ICD-10-CM | POA: Diagnosis present

## 2016-01-21 DIAGNOSIS — I509 Heart failure, unspecified: Secondary | ICD-10-CM | POA: Diagnosis not present

## 2016-01-21 DIAGNOSIS — Z9111 Patient's noncompliance with dietary regimen: Secondary | ICD-10-CM | POA: Diagnosis not present

## 2016-01-21 DIAGNOSIS — Z9114 Patient's other noncompliance with medication regimen: Secondary | ICD-10-CM | POA: Diagnosis not present

## 2016-01-21 DIAGNOSIS — N183 Chronic kidney disease, stage 3 (moderate): Secondary | ICD-10-CM | POA: Diagnosis present

## 2016-01-21 DIAGNOSIS — I429 Cardiomyopathy, unspecified: Secondary | ICD-10-CM | POA: Diagnosis not present

## 2016-01-21 DIAGNOSIS — R0602 Shortness of breath: Secondary | ICD-10-CM | POA: Diagnosis present

## 2016-01-21 DIAGNOSIS — N189 Chronic kidney disease, unspecified: Secondary | ICD-10-CM | POA: Diagnosis not present

## 2016-01-21 DIAGNOSIS — Z7901 Long term (current) use of anticoagulants: Secondary | ICD-10-CM | POA: Diagnosis not present

## 2016-01-21 DIAGNOSIS — Z79899 Other long term (current) drug therapy: Secondary | ICD-10-CM | POA: Diagnosis not present

## 2016-01-21 DIAGNOSIS — I482 Chronic atrial fibrillation: Secondary | ICD-10-CM | POA: Diagnosis present

## 2016-01-21 DIAGNOSIS — I10 Essential (primary) hypertension: Secondary | ICD-10-CM | POA: Diagnosis not present

## 2016-01-21 DIAGNOSIS — E785 Hyperlipidemia, unspecified: Secondary | ICD-10-CM | POA: Diagnosis present

## 2016-01-21 DIAGNOSIS — I5041 Acute combined systolic (congestive) and diastolic (congestive) heart failure: Secondary | ICD-10-CM | POA: Diagnosis not present

## 2016-01-21 DIAGNOSIS — Z8249 Family history of ischemic heart disease and other diseases of the circulatory system: Secondary | ICD-10-CM | POA: Diagnosis not present

## 2016-01-21 DIAGNOSIS — E876 Hypokalemia: Secondary | ICD-10-CM | POA: Diagnosis not present

## 2016-01-21 DIAGNOSIS — I5043 Acute on chronic combined systolic (congestive) and diastolic (congestive) heart failure: Secondary | ICD-10-CM | POA: Diagnosis not present

## 2016-01-21 DIAGNOSIS — Z0389 Encounter for observation for other suspected diseases and conditions ruled out: Secondary | ICD-10-CM | POA: Diagnosis not present

## 2016-01-21 DIAGNOSIS — G4733 Obstructive sleep apnea (adult) (pediatric): Secondary | ICD-10-CM | POA: Diagnosis present

## 2016-01-21 DIAGNOSIS — I4891 Unspecified atrial fibrillation: Secondary | ICD-10-CM | POA: Diagnosis not present

## 2016-01-21 DIAGNOSIS — Z8711 Personal history of peptic ulcer disease: Secondary | ICD-10-CM | POA: Diagnosis not present

## 2016-01-21 DIAGNOSIS — N179 Acute kidney failure, unspecified: Secondary | ICD-10-CM | POA: Diagnosis present

## 2016-01-21 DIAGNOSIS — Z87891 Personal history of nicotine dependence: Secondary | ICD-10-CM | POA: Diagnosis not present

## 2016-01-21 LAB — COMPREHENSIVE METABOLIC PANEL
ALBUMIN: 3.2 g/dL — AB (ref 3.5–5.0)
ALK PHOS: 56 U/L (ref 38–126)
ALT: 18 U/L (ref 17–63)
AST: 23 U/L (ref 15–41)
Anion gap: 6 (ref 5–15)
BILIRUBIN TOTAL: 2.5 mg/dL — AB (ref 0.3–1.2)
BUN: 20 mg/dL (ref 6–20)
CALCIUM: 8.3 mg/dL — AB (ref 8.9–10.3)
CO2: 25 mmol/L (ref 22–32)
CREATININE: 1.15 mg/dL (ref 0.61–1.24)
Chloride: 111 mmol/L (ref 101–111)
GFR calc Af Amer: >60 mL/min (ref >60)
GLUCOSE: 90 mg/dL (ref 65–99)
Potassium: 3.5 mmol/L (ref 3.5–5.1)
Sodium: 142 mmol/L (ref 135–145)
TOTAL PROTEIN: 6 g/dL — AB (ref 6.5–8.1)

## 2016-01-21 LAB — CBC
HEMATOCRIT: 32.5 % — AB (ref 39.0–52.0)
HEMOGLOBIN: 10.4 g/dL — AB (ref 13.0–17.0)
MCH: 26.8 pg (ref 26.0–34.0)
MCHC: 32 g/dL (ref 30.0–36.0)
MCV: 83.8 fL (ref 78.0–100.0)
Platelets: 180 10*3/uL (ref 150–400)
RBC: 3.88 MIL/uL — ABNORMAL LOW (ref 4.22–5.81)
RDW: 15.9 % — ABNORMAL HIGH (ref 11.5–15.5)
WBC: 7.8 10*3/uL (ref 4.0–10.5)

## 2016-01-21 MED ORDER — SODIUM CHLORIDE 0.9% FLUSH: 3.0000 mL | INTRAVENOUS | Status: DC | PRN

## 2016-01-21 MED ORDER — SODIUM CHLORIDE 0.9% FLUSH
3.0000 mL | Freq: Two times a day (BID) | INTRAVENOUS | Status: DC
  Administered 2016-01-22 – 2016-01-25 (×5): 3 mL via INTRAVENOUS

## 2016-01-21 MED ORDER — METOPROLOL SUCCINATE ER 50 MG PO TB24
100.0000 mg | ORAL_TABLET | Freq: Two times a day (BID) | ORAL | Status: DC
  Administered 2016-01-21 – 2016-01-24 (×7): 100 mg via ORAL
  Filled 2016-01-21 (×7): qty 2

## 2016-01-21 MED ORDER — SODIUM CHLORIDE 0.9% FLUSH
3.0000 mL | Freq: Two times a day (BID) | INTRAVENOUS | Status: DC
  Administered 2016-01-21 – 2016-01-25 (×9): 3 mL via INTRAVENOUS

## 2016-01-21 MED ORDER — ATORVASTATIN CALCIUM 40 MG PO TABS
80.0000 mg | ORAL_TABLET | Freq: Every day | ORAL | Status: DC
  Administered 2016-01-21 – 2016-01-25 (×5): 80 mg via ORAL
  Filled 2016-01-21 (×5): qty 2

## 2016-01-21 MED ORDER — LISINOPRIL 5 MG PO TABS
5.0000 mg | ORAL_TABLET | Freq: Every day | ORAL | Status: DC
  Administered 2016-01-21 – 2016-01-22 (×2): 5 mg via ORAL
  Filled 2016-01-21 (×2): qty 1

## 2016-01-21 MED ORDER — SODIUM CHLORIDE 0.9 % IV SOLN: INTRAVENOUS | Status: AC

## 2016-01-21 MED ORDER — BUDESONIDE 0.5 MG/2ML IN SUSP: 2.0000 mL | Freq: Every day | RESPIRATORY_TRACT | Status: DC | PRN

## 2016-01-21 MED ORDER — POTASSIUM CHLORIDE CRYS ER 20 MEQ PO TBCR
20.0000 meq | EXTENDED_RELEASE_TABLET | Freq: Two times a day (BID) | ORAL | Status: DC
  Administered 2016-01-21 – 2016-01-23 (×6): 20 meq via ORAL
  Filled 2016-01-21 (×6): qty 1

## 2016-01-21 MED ORDER — RIVAROXABAN 20 MG PO TABS
20.0000 mg | ORAL_TABLET | Freq: Every morning | ORAL | Status: DC
  Administered 2016-01-21 – 2016-01-25 (×5): 20 mg via ORAL
  Filled 2016-01-21 (×5): qty 1

## 2016-01-21 MED ORDER — FUROSEMIDE 10 MG/ML IJ SOLN
40.0000 mg | Freq: Two times a day (BID) | INTRAMUSCULAR | Status: DC
  Administered 2016-01-21 – 2016-01-24 (×7): 40 mg via INTRAVENOUS
  Filled 2016-01-21 (×7): qty 4

## 2016-01-21 MED ORDER — ONDANSETRON HCL 4 MG/2ML IJ SOLN
4.0000 mg | Freq: Four times a day (QID) | INTRAMUSCULAR | Status: DC | PRN
  Administered 2016-01-22: 4 mg via INTRAVENOUS
  Filled 2016-01-21: qty 2

## 2016-01-21 MED ORDER — SODIUM CHLORIDE 0.9 % IV SOLN: 250.0000 mL | INTRAVENOUS | Status: DC | PRN

## 2016-01-21 NOTE — Care Management Note (Addendum)
Case Management Note  Patient Details  Name: [name] MRN: [id number] Date of Birth: 09-07-1965  Subjective/Objective:                  Pt admitted with CHF. Pt is from home, lives with his mom and is independent with ADL's. Pt has PCP, transportation to appointments and no difficulty affording medications. Pt is active with [facility]. [name] of [facility], made aware of admission and will obtain pt info from chart. Pt also active with the [facility] Care Team.   Action/Plan: Anticipate return home with resumption of HH services. Per [facility], pt will need new HH order and face to face.  Will cont to follow for DC planning.   Expected Discharge Date:    01/24/2016              Expected Discharge Plan:  Home w Home Health Services  In-House Referral:  NA  Discharge planning Services  CM Consult  Post Acute Care Choice:  Home Health, Resumption of Svcs/PTA Provider Choice offered to:  Patient  DME Arranged:    DME Agency:     HH Arranged:  RN HH Agency:  [facility]  Status of Service:  In process, will continue to follow  If discussed at Long Length of Stay Meetings, dates discussed:    Additional Comments:  Samuel Metro, RN 01/21/2016, 10:22 AM

## 2016-01-21 NOTE — Care Management Obs Status (Signed)
MEDICARE OBSERVATION STATUS NOTIFICATION   Patient Details  Name: ROMIL GILLY MRN: [id number] Date of Birth: 12/16/1965   Medicare Observation Status Notification Given:  Yes    [name], RN 01/21/2016, 9:25 AM

## 2016-01-21 NOTE — Progress Notes (Signed)
PROGRESS NOTE    [name]  ZOX:[id number] DOB: [birth year] DOA: 01/20/2016 PCP: Pearson Grippe, MD     Brief Narrative:  50 year old man admitted on 7/31 from home due to increased lower extremity edema. He is well-known to our hospital for frequent admissions due to combined CHF and noncompliance with medications and diet.   Assessment & Plan:   Active Problems:   Hypokalemia   Nonischemic cardiomyopathy (HCC)   CHF (congestive heart failure) (HCC)   CHF exacerbation (HCC)   Acute on chronic combined CHF -Continue IV Lasix,he is 2.4 L negative since admission.  -He remains markedly volume overloaded on exam. -Frequent admissions due to noncompliance with diet and medications.  Mild troponin elevation -He typically has elevated troponins, likely due to continually exacerbated CHF. -No chest pain, no acute changes on EKG, no plans for further workup.  Atrial fibrillation -Continue metoprolol for rate control, continue anticoagulation with xarelto    DVT prophylaxis: Xarelto  Code Status: Full code Family Communication: Patient only Disposition Plan: to be detrmined  Consultants:   None  Procedures:   None  Antimicrobials:   None    Subjective: No complaints, feels his fluid status is improved   Objective: Vitals:   01/21/16 0452 01/21/16 0738 01/21/16 0800 01/21/16 1402  BP: (!) 135/94   118/79  Pulse: (!) 51  60 (!) 57  Resp: (!) 24   (!) 28  Temp: 100.1 F (37.8 C)   98.7 F (37.1 C)  TempSrc: Oral   Oral  SpO2: 94%   100%  Weight:  (!) 136.8 kg (301 lb 9.6 oz)    Height:        Intake/Output Summary (Last 24 hours) at 01/21/16 1558 Last data filed at 01/21/16 0933  Gross per 24 hour  Intake                0 ml  Output             2385 ml  Net            -2385 ml   Filed Weights   01/20/16 1805 01/20/16 2220 01/21/16 0738  Weight: 135.6 kg (299 lb) (!) 139.8 kg (308 lb 1.6 oz) (!) 136.8 kg (301 lb 9.6 oz)    Examination:  General  exam: Alert, awake, oriented x 3 Respiratory system: Clear to auscultation. Respiratory effort normal. Cardiovascular system:RRR. No murmurs, rubs, gallops. Gastrointestinal system: Abdomen is nondistended, soft and nontender. No organomegaly or masses felt. Normal bowel sounds heard. Central nervous system: Alert and oriented. No focal neurological deficits. Extremities: 4+ pitting edema bilaterally     Data Reviewed: I have personally reviewed following labs and imaging studies  CBC:  Recent Labs Lab 01/17/16 0948 01/20/16 1851 01/21/16 0541  WBC 4.0 4.2 7.8  NEUTROABS 2.7 2.8  --   HGB 10.9* 10.5* 10.4*  HCT 33.7* 32.6* 32.5*  MCV 83.4 83.6 83.8  PLT 167 181 180   Basic Metabolic Panel:  Recent Labs Lab 01/17/16 0948 01/20/16 1851 01/21/16 0541  NA 139 137 142  K 3.5 3.4* 3.5  CL 113* 111 111  CO2 GLUCOSE 87 90 90  BUN CREATININE 1.04 0.97 1.15  CALCIUM 8.2* 8.4* 8.3*   GFR: Estimated Creatinine Clearance: 113.8 mL/min (by C-G formula based on SCr of 1.15 mg/dL). Liver Function Tests:  Recent Labs Lab 01/17/16 0948 01/21/16 0541  AST 25 23  ALT 21 18  ALKPHOS  65 56  BILITOT 1.5* 2.5*  PROT 6.5 6.0*  ALBUMIN 3.5 3.2*   No results for input(s): LIPASE, AMYLASE in the last 168 hours. No results for input(s): AMMONIA in the last 168 hours. Coagulation Profile: No results for input(s): INR, PROTIME in the last 168 hours. Cardiac Enzymes:  Recent Labs Lab 01/17/16 0948 01/17/16 1303 01/20/16 1851  TROPONINI 0.03* 0.03* 0.03*   BNP (last 3 results) No results for input(s): PROBNP in the last 8760 hours. HbA1C: No results for input(s): HGBA1C in the last 72 hours. CBG: No results for input(s): GLUCAP in the last 168 hours. Lipid Profile: No results for input(s): CHOL, HDL, LDLCALC, TRIG, CHOLHDL, LDLDIRECT in the last 72 hours. Thyroid Function Tests: No results for input(s): TSH, T4TOTAL, FREET4, T3FREE, THYROIDAB in the  last 72 hours. Anemia Panel: No results for input(s): VITAMINB12, FOLATE, FERRITIN, TIBC, IRON, RETICCTPCT in the last 72 hours. Urine analysis:    Component Value Date/Time   COLORURINE YELLOW 01/17/2016 1029   APPEARANCEUR CLEAR 01/17/2016 1029   LABSPEC 1.020 01/17/2016 1029   PHURINE 6.0 01/17/2016 1029   GLUCOSEU NEGATIVE 01/17/2016 1029   GLUCOSEU NEG mg/dL 43/15/4008 6761   HGBUR TRACE (A) 01/17/2016 1029   BILIRUBINUR NEGATIVE 01/17/2016 1029   KETONESUR NEGATIVE 01/17/2016 1029   PROTEINUR 100 (A) 01/17/2016 1029   UROBILINOGEN 0.2 08/08/2014 1445   NITRITE NEGATIVE 01/17/2016 1029   LEUKOCYTESUR NEGATIVE 01/17/2016 1029   Sepsis Labs: @LABRCNTIP (procalcitonin:4,lacticidven:4)  )No results found for this or any previous visit (from the past 240 hour(s)).       Radiology Studies: Dg Chest 2 View  Result Date: 01/20/2016 CLINICAL DATA:  50 year old male with shortness of breath, chest pain, cough, nausea and fever for 2 days. Initial encounter. EXAM: CHEST  2 VIEW COMPARISON:  01/17/2016 and earlier. FINDINGS: Continued indistinct appearance of pulmonary vasculature throughout. Stable cardiomegaly and mediastinal contours. Small volume fluid tracking in the major fissures. No other layering pleural effusion. No pneumothorax. No confluent opacity other than crowding of markings at both lung bases today - and the previously questioned right upper lobe opacity has resolved. Visualized tracheal air column is within normal limits. No acute osseous abnormality identified. IMPRESSION: Appearance most compatible with pulmonary edema with small volume pleural fluid. Electronically Signed   By: Odessa Fleming M.D.   On: 01/20/2016 20:04        Scheduled Meds: . sodium chloride   Intravenous STAT  . atorvastatin  80 mg Oral Daily  . furosemide  40 mg Intravenous Q12H  . lisinopril  5 mg Oral Daily  . metoprolol succinate  100 mg Oral BID  . potassium chloride SA  20 mEq Oral BID  .  rivaroxaban  20 mg Oral q morning - 10a  . sodium chloride flush  3 mL Intravenous Q12H  . sodium chloride flush  3 mL Intravenous Q12H   Continuous Infusions:    LOS: 0 days    Time spent: 25 minutes . Greater than 50% of this time was spent in direct contact with the patient coordinating care.     Chaya Jan, MD [facility] Pager [phone]  If 7PM-7AM, please contact night-coverage [email or web address] Password [email or web address] 01/21/2016, 3:58 PM

## 2016-01-22 DIAGNOSIS — I5041 Acute combined systolic (congestive) and diastolic (congestive) heart failure: Secondary | ICD-10-CM

## 2016-01-22 DIAGNOSIS — I5043 Acute on chronic combined systolic (congestive) and diastolic (congestive) heart failure: Secondary | ICD-10-CM

## 2016-01-22 LAB — BASIC METABOLIC PANEL
ANION GAP: 10 (ref 5–15)
BUN: 27 mg/dL — ABNORMAL HIGH (ref 6–20)
CALCIUM: 8.3 mg/dL — AB (ref 8.9–10.3)
CO2: 23 mmol/L (ref 22–32)
CREATININE: 1.43 mg/dL — AB (ref 0.61–1.24)
Chloride: 108 mmol/L (ref 101–111)
GFR calc non Af Amer: 57 mL/min — ABNORMAL LOW (ref >60)
GLUCOSE: 92 mg/dL (ref 65–99)
POTASSIUM: 3.7 mmol/L (ref 3.5–5.1)
Sodium: 141 mmol/L (ref 135–145)

## 2016-01-22 NOTE — Progress Notes (Signed)
Informed by central telemetry that pt had 13 beat run VTach. Pt asymptomatic and denies chest pain or discomfort. MD E-Paged. Pt vitals are as follows   01/22/16 1544  Vitals  Temp 98.8 F (37.1 C)  Temp Source Oral  BP (!) 132/95 (told rn)  BP Location Right Arm  BP Method Automatic  Patient Position (if appropriate) Lying  Pulse Rate 97  Pulse Rate Source Dinamap  Resp (!) 24  Oxygen Therapy  SpO2 95 %  O2 Device Room Air   Will continue to monitor patient throughout the shift.

## 2016-01-22 NOTE — Care Management Important Message (Signed)
Important Message  Patient Details  Name: [name] MRN: [id number] Date of Birth: [birth year]   Medicare Important Message Given:  Yes    [name], RN 01/22/2016, 11:48 AM

## 2016-01-22 NOTE — Progress Notes (Signed)
On call MD notified of pt's temp of 100.7 and BP of 169/106.  Told to give Lasiks, but no further interventions needed.  Temp to pt's room lowered and blankets removed.

## 2016-01-22 NOTE — Progress Notes (Signed)
PROGRESS NOTE    [name]  NWG:[id number] DOB: 03-29-1966 DOA: 01/20/2016 PCP: Pearson Grippe, MD     Subjective: Feels his breathing is better, creatinine went up to 1.43 this morning  Brief Narrative:  50 year old man admitted on 7/31 from home due to increased lower extremity edema. He is well-known to our hospital for frequent admissions due to combined CHF and noncompliance with medications and diet.  Assessment & Plan:   Active Problems:   Hypokalemia   Nonischemic cardiomyopathy (HCC)   CHF (congestive heart failure) (HCC)   CHF exacerbation (HCC)   Acute on chronic combined CHF -Continue IV Lasix, he is 3.0 L negative since admission.  -He remains markedly volume overloaded on exam. -Frequent admissions due to noncompliance with diet and medications. -I had prolonged discussion with the patient, reported he is compliant and that is "his Lasix does not work at home because it's generic"  Mild troponin elevation -He typically has elevated troponins, likely due to continually exacerbated CHF. -No chest pain, no acute changes on EKG, no plans for further workup.  Atrial fibrillation -Continue metoprolol for rate control, continue anticoagulation with xarelto  Acute kidney injury -Creatinine baseline is 0.9, creatinine went up to 1.43 this morning likely secondary to diuresis. -Had held lisinopril but continue diuresis as he has +3 pedal edema bilaterally. -Check BMP in a.m.   DVT prophylaxis: Xarelto  Code Status: Full code Family Communication: Patient only Disposition Plan: to be detrmined  Consultants:   None  Procedures:   None  Antimicrobials:   None     Objective: Vitals:   01/21/16 2129 01/22/16 0258 01/22/16 0442 01/22/16 0852  BP: (!) 121/94  (!) 162/106 (!) 163/99  Pulse: 89  88 66  Resp: (!) 28  (!) 24   Temp: 98.9 F (37.2 C) 99.6 F (37.6 C) (!) 100.7 F (38.2 C)   TempSrc: Oral Oral Oral   SpO2: 93%  98%   Weight:   (!) 138.1  kg (304 lb 8 oz)   Height:        Intake/Output Summary (Last 24 hours) at 01/22/16 1455 Last data filed at 01/22/16 0450  Gross per 24 hour  Intake              720 ml  Output             1375 ml  Net             -655 ml   Filed Weights   01/20/16 2220 01/21/16 0738 01/22/16 0442  Weight: (!) 139.8 kg (308 lb 1.6 oz) (!) 136.8 kg (301 lb 9.6 oz) (!) 138.1 kg (304 lb 8 oz)    Examination:  General exam: Alert, awake, oriented x 3 Respiratory system: Clear to auscultation. Respiratory effort normal. Cardiovascular system:RRR. No murmurs, rubs, gallops. Gastrointestinal system: Abdomen is nondistended, soft and nontender. No organomegaly or masses felt. Normal bowel sounds heard. Central nervous system: Alert and oriented. No focal neurological deficits. Extremities: +3 pitting edema bilaterally     Data Reviewed: I have personally reviewed following labs and imaging studies  CBC:  Recent Labs Lab 01/17/16 0948 01/20/16 1851 01/21/16 0541  WBC 4.0 4.2 7.8  NEUTROABS 2.7 2.8  --   HGB 10.9* 10.5* 10.4*  HCT 33.7* 32.6* 32.5*  MCV 83.4 83.6 83.8  PLT 167 181 180   Basic Metabolic Panel:  Recent Labs Lab 01/17/16 0948 01/20/16 1851 01/21/16 0541 01/22/16 0539  NA 139 137 142 141  K 3.5 3.4* 3.5 3.7  CL 113* 111 111 108  CO2 23 22 25 23   GLUCOSE 87 90 90 92  BUN 18 18 20  27*  CREATININE 1.04 0.97 1.15 1.43*  CALCIUM 8.2* 8.4* 8.3* 8.3*   GFR: Estimated Creatinine Clearance: 92 mL/min (by C-G formula based on SCr of 1.43 mg/dL). Liver Function Tests:  Recent Labs Lab 01/17/16 0948 01/21/16 0541  AST 25 23  ALT 21 18  ALKPHOS 65 56  BILITOT 1.5* 2.5*  PROT 6.5 6.0*  ALBUMIN 3.5 3.2*   No results for input(s): LIPASE, AMYLASE in the last 168 hours. No results for input(s): AMMONIA in the last 168 hours. Coagulation Profile: No results for input(s): INR, PROTIME in the last 168 hours. Cardiac Enzymes:  Recent Labs Lab 01/17/16 0948  01/17/16 1303 01/20/16 1851  TROPONINI 0.03* 0.03* 0.03*   BNP (last 3 results) No results for input(s): PROBNP in the last 8760 hours. HbA1C: No results for input(s): HGBA1C in the last 72 hours. CBG: No results for input(s): GLUCAP in the last 168 hours. Lipid Profile: No results for input(s): CHOL, HDL, LDLCALC, TRIG, CHOLHDL, LDLDIRECT in the last 72 hours. Thyroid Function Tests: No results for input(s): TSH, T4TOTAL, FREET4, T3FREE, THYROIDAB in the last 72 hours. Anemia Panel: No results for input(s): VITAMINB12, FOLATE, FERRITIN, TIBC, IRON, RETICCTPCT in the last 72 hours. Urine analysis:    Component Value Date/Time   COLORURINE YELLOW 01/17/2016 1029   APPEARANCEUR CLEAR 01/17/2016 1029   LABSPEC 1.020 01/17/2016 1029   PHURINE 6.0 01/17/2016 1029   GLUCOSEU NEGATIVE 01/17/2016 1029   GLUCOSEU NEG mg/dL 30/02/2329 0762   HGBUR TRACE (A) 01/17/2016 1029   BILIRUBINUR NEGATIVE 01/17/2016 1029   KETONESUR NEGATIVE 01/17/2016 1029   PROTEINUR 100 (A) 01/17/2016 1029   UROBILINOGEN 0.2 08/08/2014 1445   NITRITE NEGATIVE 01/17/2016 1029   LEUKOCYTESUR NEGATIVE 01/17/2016 1029   Sepsis Labs: @LABRCNTIP (procalcitonin:4,lacticidven:4)  )No results found for this or any previous visit (from the past 240 hour(s)).       Radiology Studies: Dg Chest 2 View  Result Date: 01/20/2016 CLINICAL DATA:  50 year old male with shortness of breath, chest pain, cough, nausea and fever for 2 days. Initial encounter. EXAM: CHEST  2 VIEW COMPARISON:  01/17/2016 and earlier. FINDINGS: Continued indistinct appearance of pulmonary vasculature throughout. Stable cardiomegaly and mediastinal contours. Small volume fluid tracking in the major fissures. No other layering pleural effusion. No pneumothorax. No confluent opacity other than crowding of markings at both lung bases today - and the previously questioned right upper lobe opacity has resolved. Visualized tracheal air column is within  normal limits. No acute osseous abnormality identified. IMPRESSION: Appearance most compatible with pulmonary edema with small volume pleural fluid. Electronically Signed   By: Odessa Fleming M.D.   On: 01/20/2016 20:04        Scheduled Meds: . atorvastatin  80 mg Oral Daily  . furosemide  40 mg Intravenous Q12H  . metoprolol succinate  100 mg Oral BID  . potassium chloride SA  20 mEq Oral BID  . rivaroxaban  20 mg Oral q morning - 10a  . sodium chloride flush  3 mL Intravenous Q12H  . sodium chloride flush  3 mL Intravenous Q12H   Continuous Infusions:    LOS: 1 day    Time spent: 25 minutes . Greater than 50% of this time was spent in direct contact with the patient coordinating care.     Clint Lipps, MD [facility]  Pager [phone]  If 7PM-7AM, please contact night-coverage [email or web address] Password [email or web address] 01/22/2016, 2:55 PM

## 2016-01-23 DIAGNOSIS — I4729 Other ventricular tachycardia: Secondary | ICD-10-CM

## 2016-01-23 DIAGNOSIS — IMO0001 Reserved for inherently not codable concepts without codable children: Secondary | ICD-10-CM

## 2016-01-23 DIAGNOSIS — I10 Essential (primary) hypertension: Secondary | ICD-10-CM

## 2016-01-23 DIAGNOSIS — N189 Chronic kidney disease, unspecified: Secondary | ICD-10-CM

## 2016-01-23 DIAGNOSIS — I472 Ventricular tachycardia: Secondary | ICD-10-CM

## 2016-01-23 DIAGNOSIS — Z7901 Long term (current) use of anticoagulants: Secondary | ICD-10-CM

## 2016-01-23 DIAGNOSIS — N183 Chronic kidney disease, stage 3 unspecified: Secondary | ICD-10-CM | POA: Diagnosis present

## 2016-01-23 DIAGNOSIS — I482 Chronic atrial fibrillation: Secondary | ICD-10-CM

## 2016-01-23 DIAGNOSIS — Z0389 Encounter for observation for other suspected diseases and conditions ruled out: Secondary | ICD-10-CM

## 2016-01-23 DIAGNOSIS — I5023 Acute on chronic systolic (congestive) heart failure: Secondary | ICD-10-CM

## 2016-01-23 LAB — BASIC METABOLIC PANEL
ANION GAP: 6 (ref 5–15)
BUN: 33 mg/dL — ABNORMAL HIGH (ref 6–20)
CO2: 28 mmol/L (ref 22–32)
Calcium: 8.3 mg/dL — ABNORMAL LOW (ref 8.9–10.3)
Chloride: 106 mmol/L (ref 101–111)
Creatinine, Ser: 1.37 mg/dL — ABNORMAL HIGH (ref 0.61–1.24)
GFR calc Af Amer: >60 mL/min (ref >60)
GLUCOSE: 88 mg/dL (ref 65–99)
POTASSIUM: 3.6 mmol/L (ref 3.5–5.1)
Sodium: 140 mmol/L (ref 135–145)

## 2016-01-23 LAB — MAGNESIUM: MAGNESIUM: 1.8 mg/dL (ref 1.7–2.4)

## 2016-01-23 MED ORDER — MAGNESIUM OXIDE 400 (241.3 MG) MG PO TABS
400.0000 mg | ORAL_TABLET | Freq: Every day | ORAL | Status: DC
  Administered 2016-01-23 – 2016-01-25 (×3): 400 mg via ORAL
  Filled 2016-01-23 (×3): qty 1

## 2016-01-23 NOTE — Consult Note (Addendum)
Reason for Consult:   Recurrent CHF  Requesting Physician: [facility], Dr. Clydia Llano  Primary Cardiologist Dr. Prentice Docker  HPI:   50 y/o obese, AA male with a history of nonischemic cardiomyopathy and recurring combined CHF in the face of treatment noncompliance. Cardiac catheterization in Dec 2016 revealed normal coronary arteries. He also has CAF and is on Xarelto. He has actually been dismissed from our practice per Dr. Purvis Sheffield - please see chart. He was recently admitted 7/1/7-12/25/15 with CHF, wgt went from 295 to 275 by discharge. The pt says he has been taking his medication. He says he tries to avoid high sodium foods and doesn't add salt. He tells me he gradually gained wgt, associated with increased LE edema and dyspnea. He presented to the ED 01/20/16 with SOB. His wgt on admission was 299 lbs. Wgt today 300 lbs, though negative I/O of 6L recorded since admission.   PMHx:  Past Medical History:  Diagnosis Date  . Allergic rhinitis   . Asthma   . Atrial fibrillation (HCC)   . Essential hypertension   . Gastroesophageal reflux disease   . History of cardiac catheterization    Normal coronaries December 2016  . Noncompliance    Major problem leading to declining health and recurrent hospitalization  . Nonischemic cardiomyopathy (HCC)    LVEF 20-25%  . OSA (obstructive sleep apnea)   . Osteoarthritis   . Peptic ulcer disease     Past Surgical History:  Procedure Laterality Date  . CARDIAC CATHETERIZATION N/A 06/14/2015   Procedure: Right/Left Heart Cath and Coronary Angiography;  Surgeon: Runell Gess, MD;  Location: [facility] INVASIVE CV LAB;  Service: Cardiovascular;  Laterality: N/A;    SOCHx:  reports that he quit smoking about 8 years ago. His smoking use included Cigarettes. He started smoking about 27 years ago. He has a 10.00 pack-year smoking history. He has never used smokeless tobacco. He reports that he does not drink alcohol or use  drugs.He lives with his mother and his children  FAMHx: Family History  Problem Relation Age of Onset  . Stroke Father   . Heart attack Father   . Aneurysm Mother     Cerebral aneurysm  . Hypertension Sister   . Colon cancer Neg Hx   . Inflammatory bowel disease Neg Hx   . Liver disease Neg Hx     ALLERGIES: Allergies  Allergen Reactions  . Bee Venom Shortness Of Breath and Swelling  . Lipitor [Atorvastatin] Shortness Of Breath and Other (See Comments)    Nose bleed  . Aspirin Other (See Comments)    Chewable 81 mg tablets upsets patients stomach  . Banana Diarrhea  . Diltiazem Other (See Comments)    Makes heart beat fast  . Hydralazine Other (See Comments)    Patient states it causes Tachycardia    ROS: Review of Systems: General: negative for chills, fever, night sweats or weight changes.  Cardiovascular: negative for chest pain, palpitations HEENT: negative for any visual disturbances, blindness, glaucoma Dermatological: negative for rash Respiratory: negative for cough, hemoptysis, or wheezing Urologic: negative for hematuria or dysuria Abdominal: negative for nausea, vomiting, diarrhea, bright red blood per rectum, melena, or hematemesis Neurologic: negative for visual changes, syncope, or dizziness Musculoskeletal: negative for back pain, joint pain, or swelling Psych: cooperative and appropriate All other systems reviewed and are otherwise negative except as noted above.   HOME MEDICATIONS: Prior to Admission medications   Medication Sig Start Date End Date  Taking? Authorizing Provider  atorvastatin (LIPITOR) 80 MG tablet Take 80 mg by mouth daily.   Yes Historical Provider, MD  beclomethasone (QVAR) 80 MCG/ACT inhaler Inhale 2 puffs into the lungs daily as needed (shortness of breath). 11/21/15  Yes Erick Blinks, MD  furosemide (LASIX) 80 MG tablet Take 1 tablet (80 mg total) by mouth 2 (two) times daily. 12/02/15  Yes Jodelle Gross, NP  metoprolol  succinate (TOPROL-XL) 100 MG 24 hr tablet Take 1 tablet (100 mg total) by mouth 2 (two) times daily. Take with or immediately following a meal. 01/17/16  Yes Glynn Octave, MD  potassium chloride SA (K-DUR,KLOR-CON) 20 MEQ tablet Take 2 tablets 2 times daily for 7 days and then 1 tablet 2 times daily thereafter. Patient taking differently: Take 20 mEq by mouth 2 (two) times daily.  01/09/16  Yes Elliot Cousin, MD  rivaroxaban (XARELTO) 20 MG TABS tablet Take 1 tablet (20 mg total) by mouth every evening. Patient taking differently: Take 20 mg by mouth every morning.  11/21/15  Yes Erick Blinks, MD  lisinopril (PRINIVIL,ZESTRIL) 5 MG tablet Take 1 tablet (5 mg total) by mouth daily. 01/17/16   Glynn Octave, MD    HOSPITAL MEDICATIONS: I have reviewed the patient's current medications.  VITALS: Blood pressure (!) 158/100, pulse 79, temperature 97.6 F (36.4 C), temperature source Oral, resp. rate (!) 22, height 6' (1.829 m), weight (!) 300 lb 11.2 oz (136.4 kg), SpO2 99 %.  PHYSICAL EXAM: General appearance: alert, cooperative, no distress and morbidly obese Neck: no JVD Lungs: clear to auscultation bilaterally Heart: irregularly irregular rhythm Abdomen: obese, non tender Extremities: 2-3+ bilateral pitting edema Pulses: 2+ and symmetric Skin: Skin color, texture, turgor normal. No rashes or lesions Neurologic: Grossly normal  LABS: Results for orders placed or performed during the hospital encounter of 01/20/16 (from the past 24 hour(s))  Basic metabolic panel     Status: Abnormal   Collection Time: 01/23/16  5:35 AM  Result Value Ref Range   Sodium 140 135 - 145 mmol/L   Potassium 3.6 3.5 - 5.1 mmol/L   Chloride 106 101 - 111 mmol/L   CO2 28 22 - 32 mmol/L   Glucose, Bld 88 65 - 99 mg/dL   BUN 33 (H) 6 - 20 mg/dL   Creatinine, Ser 6.75 (H) 0.61 - 1.24 mg/dL   Calcium 8.3 (L) 8.9 - 10.3 mg/dL   GFR calc non Af Amer >60 >60 mL/min   GFR calc Af Amer >60 >60 mL/min   Anion  gap 6 5 - 15  Magnesium     Status: None   Collection Time: 01/23/16  5:35 AM  Result Value Ref Range   Magnesium 1.8 1.7 - 2.4 mg/dL    EKG: AF with PVC versus aberrantly conducted complex, borderline prolonged QT interval.  Telemetry: AF, 15 bt run of NSVT  IMAGING:  CXR 01/20/16: CHEST  2 VIEW  COMPARISON:  01/17/2016 and earlier.  FINDINGS: Continued indistinct appearance of pulmonary vasculature throughout. Stable cardiomegaly and mediastinal contours. Small volume fluid tracking in the major fissures. No other layering pleural effusion. No pneumothorax. No confluent opacity other than crowding of markings at both lung bases today - and the previously questioned right upper lobe opacity has resolved. Visualized tracheal air column is within normal limits. No acute osseous abnormality identified.  IMPRESSION: Appearance most compatible with pulmonary edema with small volume pleural fluid.  Echocardiogram 09/30/2015: Study Conclusions  - Left ventricle: The cavity size was normal.  There was severe   concentric hypertrophy. Systolic function was severely reduced.   The estimated ejection fraction was in the range of 20% to 25%.   Diffuse hypokinesis. The study is not technically sufficient to   allow evaluation of LV diastolic function. - Mitral valve: Mildly thickened leaflets . There was trivial   regurgitation. - Left atrium: Moderately dilated at 47 ml/m2. - Right ventricle: The cavity size was mildly dilated. Systolic   function was normal. - Right atrium: Severely dilated at 29 cm2. - Tricuspid valve: There was mild regurgitation. - Pulmonary arteries: PA peak pressure: 49 mm Hg (S). - Inferior vena cava: The vessel was dilated. The respirophasic   diameter changes were blunted (< 50%), consistent with elevated   central venous pressure.  Impressions:  - Compared to a prior study in 05/2015, the LVEF has declined to   20-25% with global hypokinesis.  A-fib is noted to be present.  IMPRESSION: Principal Problem:   Acute on chronic systolic congestive heart failure (HCC) Active Problems:   Hyperlipidemia   Essential hypertension   Noncompliance with medication regimen   Hypokalemia   Chronic atrial fibrillation (HCC)   Nonischemic cardiomyopathy (HCC)   Morbid obesity due to excess calories [facility])   Coronary arteries, normal Dec 2016   Chronic anticoagulation-Xarelto   Chronic renal insufficiency, stage III (moderate)   NSVT (nonsustained ventricular tachycardia) (HCC)   RECOMMENDATION: Will assist with medical therapy titration during current hospitalization. Discuss with MD consider Heart Failure referral - he saw Dr Gala Romney in Dec 2016.  NSVT a bad prognostic sign in this patient, but he was not felt to be a candidate for an ICD previously because of compliance issues.   Time Spent Directly with Patient: [address] minutes  Corine Shelter, [place]  [phone] beeper 01/23/2016, 2:53 PM    Attending note:  Patient seen and examined. Reviewed records and discussed the case with Mr. Burnard Leigh. Mr. Lad is a patient of Dr. Purvis Sheffield who has actually been recently discharged from our practice. When I saw that we were consulted to see him in the hospital during this admission, I asked our office administration to discuss with Risk Management what our obligations were. It was explained to me that we were obligated to see him in the hospital since consultation was requested and there was no alternative cardiology practice to see him at this facility. I reviewed the recent records including hospitalization in late July at which time his discharge weight was recorded at 276 pounds after diuresis. He is readmitted with approximately 25 pounds of fluid gain. He states that he has been taking his medications as directed and not adding salt to his food. On the other hand, he also tells me that he thinks that his medications make his heart "speed up"  and also make him "gain fluid." He has prior documented noncompliance on a recurring basis which was the reason for his dismissal by Dr. Purvis Sheffield.   On my examination this afternoon he was sitting comfortably in his bed, able to lie down supine. Blood pressure is not optimally controlled with systolics in the 140s to 150s over diastolic around 100. Heart rate in the 70s to 90s in atrial fibrillation. Telemetry also showed a burst of NSVT. He is morbidly obese. Elevated JVP present. Lungs exhibit diminished breath sounds, few scattered rhonchi. Cardiac exam reveals indistinct PMI with irregularly irregular rhythm, do not appreciate S3. Abdomen protuberant. Extremities show chronic appearing edema and possibly some lymphedema as well.  Lab work shows creatinine 1.37 up from 0.97, potassium 3.6, BNP 1039, troponin I flat trend at 0.03, hemoglobin 10.4, platelets 180. ECG shows atrial fibrillation with with PVC versus aberrantly conducted complex, borderline prolonged QT interval.  Patient presents with acute on chronic combined heart failure associated with significant volume overload, approximately 25 pounds. Noncompliance with medical therapy has been a recurring issue, and is certainly suspected now, although the patient states that he has been taking his medications. He has a known nonischemic cardiomyopathy with normal coronary arteries documented in December 2016. Present troponin I levels are not consistent with ACS. He did have a burst of NSVT by telemetry which does portend increased risk in the setting of cardiomyopathy. He is not however a candidate for ICD at this time in light of substantial concerns about ongoing noncompliance. He has overall poor prognosis and remains at a very high risk for recurring hospitalizations. He is diuresing reasonably well based on intake and output although weight has not changed very much as yet. Would continue IV Lasix at current dose and watch creatinine, which is  beginning to trend up. Agree with temporary hold on ACE inhibitor. Continue high-dose beta blocker for heart rate control. Considered putting him on BiDil for the time being, however he is allergic to hydralazine. As far as question raised regarding referral to the Advanced [facility] Failure [facility], this would obviously mean a complete reversal in plan to dismiss him from the practice, and would also require him to be compliant with office follow-up in [place]. Consideration of transfer to [facility] for inpatient CHF team management with additional testing could further be considered, but again this may not lead to any long-standing benefit if he is not compliant with his medical regimen at home. Obviously a complex situation. I did ask Risk Management to speak directly with Dr. Purvis Sheffield regarding solidification of a long-term plan as it relates to anticipate outpatient follow-up, whether or not the patient can be seen by a different practice in another location (perhaps this could be set up with help of Case Management), or whether different plans need to be made altogether. Seeing him only during acute exacerbations in the hospital, yet not following him in the office, does not seem to me to be a very good long-term treatment strategy.   Jonelle Sidle, M.D., F.A.C.C.

## 2016-01-23 NOTE — Progress Notes (Signed)
PROGRESS NOTE    [name]  KHT:[id number] DOB: 11-20-1965 DOA: 01/20/2016 PCP: Pearson Grippe, MD     Subjective: Denies any complaints overnight, there is 13 beats of nonsustained V. tach yesterday, cardiology consulted. Continue to hold lisinopril because of the elevated creatinine.  Brief Narrative:  50 year old man admitted on 7/31 from home due to increased lower extremity edema. He is well-known to our hospital for frequent admissions due to combined CHF and noncompliance with medications and diet.  Assessment & Plan:   Principal Problem:   Acute on chronic systolic congestive heart failure (HCC) Active Problems:   Hyperlipidemia   Essential hypertension   Noncompliance with medication regimen   Hypokalemia   Chronic atrial fibrillation (HCC)   Nonischemic cardiomyopathy (HCC)   Morbid obesity due to excess calories [facility])   Coronary arteries, normal Dec 2016   Chronic anticoagulation-Xarelto   Chronic renal insufficiency, stage III (moderate)   NSVT (nonsustained ventricular tachycardia) (HCC)   Acute on chronic combined CHF -Continue IV Lasix, he is 3.0 L negative since admission.  -He remains markedly volume overloaded on exam. -Frequent admissions due to noncompliance with diet and medications. -Question of compliance with medications at home, cardiology consulted. -? Benefit from advanced heart failure referral, patient admitted 12 times to the hospital in the past 6 months.  Nonsustained V. tach -13 beats of NSVT yesterday, his ejection fraction is 20-25%, normal potassium and magnesium. -Per cardiology was not considered for ICD because of compliance, he might benefit from ICD according to [place] trial.  Mild troponin elevation -He typically has elevated troponins, likely due to continually exacerbated CHF. -No chest pain, no acute changes on EKG, no plans for further workup.  Atrial fibrillation -Continue metoprolol for rate control, continue  anticoagulation with xarelto  Acute kidney injury -Creatinine baseline is 0.9, creatinine went up to 1.43 this morning likely secondary to diuresis. -Continue to hold lisinopril but continue diuresis as he has +3 pedal edema bilaterally. -Check BMP in a.m.   DVT prophylaxis: Xarelto  Code Status: Full code Family Communication: Patient only Disposition Plan: to be detrmined  Consultants:   None  Procedures:   None  Antimicrobials:   None     Objective: Vitals:   01/22/16 1502 01/22/16 1544 01/22/16 2238 01/23/16 0541  BP: (!) 138/104 (!) 132/95 (!) 142/103 (!) 158/100  Pulse: (!) 42 97 (!) 103 79  Resp: 20 (!) 24 20 (!) 22  Temp: 98.4 F (36.9 C) 98.8 F (37.1 C) 98.7 F (37.1 C) 97.6 F (36.4 C)  TempSrc: Oral Oral Axillary Oral  SpO2: 96% 95% 98% 99%  Weight:    (!) 136.4 kg (300 lb 11.2 oz)  Height:        Intake/Output Summary (Last 24 hours) at 01/23/16 1322 Last data filed at 01/23/16 1243  Gross per 24 hour  Intake                0 ml  Output             3300 ml  Net            -3300 ml   Filed Weights   01/21/16 0738 01/22/16 0442 01/23/16 0541  Weight: (!) 136.8 kg (301 lb 9.6 oz) (!) 138.1 kg (304 lb 8 oz) (!) 136.4 kg (300 lb 11.2 oz)    Examination:  General exam: Alert, awake, oriented x 3 Respiratory system: Clear to auscultation. Respiratory effort normal. Cardiovascular system:RRR. No murmurs, rubs, gallops. Gastrointestinal  system: Abdomen is nondistended, soft and nontender. No organomegaly or masses felt. Normal bowel sounds heard. Central nervous system: Alert and oriented. No focal neurological deficits. Extremities: +3 pitting edema bilaterally     Data Reviewed: I have personally reviewed following labs and imaging studies  CBC:  Recent Labs Lab 01/17/16 0948 01/20/16 1851 01/21/16 0541  WBC 4.0 4.2 7.8  NEUTROABS 2.7 2.8  --   HGB 10.9* 10.5* 10.4*  HCT 33.7* 32.6* 32.5*  MCV 83.4 83.6 83.8  PLT 167 181 180    Basic Metabolic Panel:  Recent Labs Lab 01/17/16 0948 01/20/16 1851 01/21/16 0541 01/22/16 0539 01/23/16 0535  NA 139 137 142 141 140  K 3.5 3.4* 3.5 3.7 3.6  CL 113* 111 111 108 106  CO2 GLUCOSE 87 90 90 92 88  BUN 27* 33*  CREATININE 1.04 0.97 1.15 1.43* 1.37*  CALCIUM 8.2* 8.4* 8.3* 8.3* 8.3*  MG  --   --   --   --  1.8   GFR: Estimated Creatinine Clearance: 95.3 mL/min (by C-G formula based on SCr of 1.37 mg/dL). Liver Function Tests:  Recent Labs Lab 01/17/16 0948 01/21/16 0541  AST 25 23  ALT 21 18  ALKPHOS 65 56  BILITOT 1.5* 2.5*  PROT 6.5 6.0*  ALBUMIN 3.5 3.2*   No results for input(s): LIPASE, AMYLASE in the last 168 hours. No results for input(s): AMMONIA in the last 168 hours. Coagulation Profile: No results for input(s): INR, PROTIME in the last 168 hours. Cardiac Enzymes:  Recent Labs Lab 01/17/16 0948 01/17/16 1303 01/20/16 1851  TROPONINI 0.03* 0.03* 0.03*   BNP (last 3 results) No results for input(s): PROBNP in the last 8760 hours. HbA1C: No results for input(s): HGBA1C in the last 72 hours. CBG: No results for input(s): GLUCAP in the last 168 hours. Lipid Profile: No results for input(s): CHOL, HDL, LDLCALC, TRIG, CHOLHDL, LDLDIRECT in the last 72 hours. Thyroid Function Tests: No results for input(s): TSH, T4TOTAL, FREET4, T3FREE, THYROIDAB in the last 72 hours. Anemia Panel: No results for input(s): VITAMINB12, FOLATE, FERRITIN, TIBC, IRON, RETICCTPCT in the last 72 hours. Urine analysis:    Component Value Date/Time   COLORURINE YELLOW 01/17/2016 1029   APPEARANCEUR CLEAR 01/17/2016 1029   LABSPEC 1.020 01/17/2016 1029   PHURINE 6.0 01/17/2016 1029   GLUCOSEU NEGATIVE 01/17/2016 1029   GLUCOSEU NEG mg/dL 69/62/9528 4132   HGBUR TRACE (A) 01/17/2016 1029   BILIRUBINUR NEGATIVE 01/17/2016 1029   KETONESUR NEGATIVE 01/17/2016 1029   PROTEINUR 100 (A) 01/17/2016 1029   UROBILINOGEN 0.2 08/08/2014  1445   NITRITE NEGATIVE 01/17/2016 1029   LEUKOCYTESUR NEGATIVE 01/17/2016 1029   Sepsis Labs: (procalcitonin:4,lacticidven:4)  )No results found for this or any previous visit (from the past 240 hour(s)).       Radiology Studies: No results found.      Scheduled Meds: . atorvastatin  80 mg Oral Daily  . furosemide  40 mg Intravenous Q12H  . metoprolol succinate  100 mg Oral BID  . potassium chloride SA  20 mEq Oral BID  . rivaroxaban  20 mg Oral q morning - 10a  . sodium chloride flush  3 mL Intravenous Q12H  . sodium chloride flush  3 mL Intravenous Q12H   Continuous Infusions:    LOS: 2 days    Time spent: 25 minutes . Greater than 50% of this time was spent in direct contact with the patient  coordinating care.     Clint Lipps, MD [facility] Pager [phone]  If 7PM-7AM, please contact night-coverage [email or web address] Password [email or web address] 01/23/2016, 1:22 PM

## 2016-01-24 DIAGNOSIS — E876 Hypokalemia: Secondary | ICD-10-CM

## 2016-01-24 DIAGNOSIS — I509 Heart failure, unspecified: Secondary | ICD-10-CM

## 2016-01-24 DIAGNOSIS — E785 Hyperlipidemia, unspecified: Secondary | ICD-10-CM

## 2016-01-24 DIAGNOSIS — I429 Cardiomyopathy, unspecified: Secondary | ICD-10-CM

## 2016-01-24 DIAGNOSIS — I472 Ventricular tachycardia: Secondary | ICD-10-CM

## 2016-01-24 DIAGNOSIS — Z9114 Patient's other noncompliance with medication regimen: Secondary | ICD-10-CM

## 2016-01-24 DIAGNOSIS — I4891 Unspecified atrial fibrillation: Secondary | ICD-10-CM

## 2016-01-24 LAB — BASIC METABOLIC PANEL
Anion gap: 6 (ref 5–15)
BUN: 30 mg/dL — AB (ref 6–20)
CALCIUM: 7.9 mg/dL — AB (ref 8.9–10.3)
CO2: 27 mmol/L (ref 22–32)
CREATININE: 1.14 mg/dL (ref 0.61–1.24)
Chloride: 107 mmol/L (ref 101–111)
GFR calc Af Amer: >60 mL/min (ref >60)
GFR calc non Af Amer: >60 mL/min (ref >60)
GLUCOSE: 91 mg/dL (ref 65–99)
Potassium: 3.6 mmol/L (ref 3.5–5.1)
Sodium: 140 mmol/L (ref 135–145)

## 2016-01-24 LAB — BRAIN NATRIURETIC PEPTIDE: B Natriuretic Peptide: 755 pg/mL — ABNORMAL HIGH (ref 0.0–100.0)

## 2016-01-24 MED ORDER — METOLAZONE 5 MG PO TABS
5.0000 mg | ORAL_TABLET | Freq: Every day | ORAL | Status: DC
  Administered 2016-01-24: 5 mg via ORAL
  Filled 2016-01-24: qty 1

## 2016-01-24 MED ORDER — POTASSIUM CHLORIDE CRYS ER 20 MEQ PO TBCR
40.0000 meq | EXTENDED_RELEASE_TABLET | Freq: Two times a day (BID) | ORAL | Status: DC
  Administered 2016-01-24 – 2016-01-25 (×2): 40 meq via ORAL
  Filled 2016-01-24 (×2): qty 2

## 2016-01-24 MED ORDER — FUROSEMIDE 10 MG/ML IJ SOLN
80.0000 mg | Freq: Two times a day (BID) | INTRAMUSCULAR | Status: DC
  Administered 2016-01-24 – 2016-01-25 (×2): 80 mg via INTRAVENOUS
  Filled 2016-01-24 (×2): qty 8

## 2016-01-24 MED ORDER — CARVEDILOL 12.5 MG PO TABS
25.0000 mg | ORAL_TABLET | Freq: Two times a day (BID) | ORAL | Status: DC
  Administered 2016-01-24 – 2016-01-25 (×2): 25 mg via ORAL
  Filled 2016-01-24 (×2): qty 2

## 2016-01-24 NOTE — Care Management Important Message (Signed)
Important Message  Patient Details  Name: [name] MRN: [id number] Date of Birth: Apr 05, 1966   Medicare Important Message Given:  Yes    [name], RN 01/24/2016, 12:26 PM

## 2016-01-24 NOTE — Progress Notes (Signed)
Subjective:  Pt is breathing better c/w admission  Objective:  Vital Signs in the last 24 hours: Temp:  [98.4 F (36.9 C)-98.7 F (37.1 C)] 98.7 F (37.1 C) (08/04 0500) Pulse Rate:  [69-95] 69 (08/04 0500) Resp:  [20-24] 20 (08/04 0500) BP: (129-154)/(92-103) 154/98 (08/04 0500) SpO2:  [95 %-98 %] 96 % (08/04 0500) Weight:  [297 lb 8 oz (134.9 kg)] 297 lb 8 oz (134.9 kg) (08/04 0500)  Intake/Output from previous day:  Intake/Output Summary (Last 24 hours) at 01/24/16 0841 Last data filed at 01/23/16 2100  Gross per 24 hour  Intake              480 ml  Output             1575 ml  Net            -1095 ml    Physical Exam: General appearance: alert, cooperative, no distress and moderately obese Neck: no carotid bruit and no JVD Lungs: few crackles Lt base Heart: irregularly irregular rhythm and 2/6 MR murmur Skin: Skin color, texture, turgor normal. No rashes or lesions Neurologic: Grossly normal   Rate: 70  Rhythm: AF with premature atrial contractions (PAC), premature ventricular contractions (PVC) and couplets  Lab Results: No results for input(s): WBC, HGB, PLT in the last 72 hours.  Recent Labs  01/23/16 0535 01/24/16 0544  NA 140 140  K 3.6 3.6  CL 106 107  CO2 28 27  GLUCOSE 88 91  BUN 33* 30*  CREATININE 1.37* 1.14   No results for input(s): TROPONINI in the last 72 hours.  Invalid input(s): CK, MB No results for input(s): INR in the last 72 hours.  Scheduled Meds: . atorvastatin  80 mg Oral Daily  . furosemide  40 mg Intravenous Q12H  . magnesium oxide  400 mg Oral Daily  . metoprolol succinate  100 mg Oral BID  . potassium chloride SA  20 mEq Oral BID  . rivaroxaban  20 mg Oral q morning - 10a  . sodium chloride flush  3 mL Intravenous Q12H  . sodium chloride flush  3 mL Intravenous Q12H   Continuous Infusions:  PRN Meds:.sodium chloride, budesonide, ondansetron (ZOFRAN) IV, sodium chloride flush   Imaging: Imaging results have been  reviewed  Cardiac Studies: Echo 09/30/15 Study Conclusions  - Left ventricle: The cavity size was normal. There was severe   concentric hypertrophy. Systolic function was severely reduced.   The estimated ejection fraction was in the range of 20% to 25%.   Diffuse hypokinesis. The study is not technically sufficient to   allow evaluation of LV diastolic function. - Mitral valve: Mildly thickened leaflets . There was trivial   regurgitation. - Left atrium: Moderately dilated at 47 ml/m2. - Right ventricle: The cavity size was mildly dilated. Systolic   function was normal. - Right atrium: Severely dilated at 29 cm2. - Tricuspid valve: There was mild regurgitation. - Pulmonary arteries: PA peak pressure: 49 mm Hg (S). - Inferior vena cava: The vessel was dilated. The respirophasic   diameter changes were blunted (< 50%), consistent with elevated   central venous pressure.  Impressions:  - Compared to a prior study in 05/2015, the LVEF has declined to   20-25% with global hypokinesis. A-fib is noted to be present.  Assessment/Plan:   Principal Problem:   Acute on chronic systolic congestive heart failure (HCC) Active Problems:   Essential hypertension   Chronic atrial fibrillation (HCC)  Nonischemic cardiomyopathy (HCC)   Chronic renal insufficiency, stage III (moderate)   NSVT (nonsustained ventricular tachycardia) (HCC)   Hyperlipidemia   Noncompliance with medication regimen   Hypokalemia   Morbid obesity due to excess calories [facility])   Coronary arteries, normal Dec 2016   Chronic anticoagulation-Xarelto   PLAN: Wgt down (?) 8 lbs. I/O negative 7.1 L. Will review with Dr Purvis Sheffield- ? referral to advanced heart failure clinic.   Continue IV Lasix. Check BNP-(1039 on adm).   Consider changing Toprol to Coreg.  ACE has been on hold secondary to bump in SCr which is now normal.   Corine Shelter PA-C 01/24/2016, 8:41 AM [phone]  The patient was seen and  examined, and I agree with the physical exam, assessment and plan as documented above by L. Kilroy, with modifications as noted below. I called and left a voicemail for Risk Management yesterday. I did speak with Dr. Dietrich Pates, Director of Quality for [facility]. While the patient says he is taking his meds on a consistent basis, I am highly suspect of this claim given his repeated issues with noncompliance. He actually believes Lasix makes him retain fluid. I have tried in the past to get [facility] assistance given his risk to himself, to no avail. He may benefit from a psychiatry consult. For the time being, continue IV diuresis. See Dr. Ival Bible note for further details from 01/23/16.   Prentice Docker, MD, [facility]  01/24/2016 10:57 AM

## 2016-01-24 NOTE — Care Management Note (Signed)
Case Management Note  Patient Details  Name: [name] MRN: [id number] Date of Birth: [birth year]   Expected Discharge Date:    01/26/2016              Expected Discharge Plan:  Home w Home Health Services  In-House Referral:  NA  Discharge planning Services  CM Consult  Post Acute Care Choice:  Home Health, Resumption of Svcs/PTA Provider Choice offered to:  Patient  DME Arranged:    DME Agency:     HH Arranged:  RN HH Agency:  [facility] ([facility]  Status of Service:  In process, will continue to follow  If discussed at Long Length of Stay Meetings, dates discussed:    Additional Comments: DC plan continues to be home with resumption of HH services. If pt discharges home over weekend, discharging RN will need to notify [facility] of DC date and fax resumption order.   [name], RN 01/24/2016, 12:27 PM

## 2016-01-24 NOTE — Progress Notes (Signed)
PROGRESS NOTE    [name]  BXI:[id number] DOB: Jan 20, 1966 DOA: 01/20/2016 PCP: Pearson Grippe, MD   Subjective: Seen with nursing staff at bedside, patient denies any SOB but still has significant lower extremity edema. I appreciate cardiology help.  Brief Narrative:  50 year old man admitted on 7/31 from home due to increased lower extremity edema. He is well-known to our hospital for frequent admissions due to combined CHF and noncompliance with medications and diet.  Assessment & Plan:   Principal Problem:   Acute on chronic systolic congestive heart failure (HCC) Active Problems:   Hyperlipidemia   Essential hypertension   Noncompliance with medication regimen   Hypokalemia   Chronic atrial fibrillation (HCC)   Nonischemic cardiomyopathy (HCC)   Morbid obesity due to excess calories [facility])   Coronary arteries, normal Dec 2016   Chronic anticoagulation-Xarelto   Chronic renal insufficiency, stage III (moderate)   NSVT (nonsustained ventricular tachycardia) (HCC)   Acute on chronic combined CHF -Continue IV Lasix, he is 3.0 L negative since admission.  -He remains markedly volume overloaded on exam. -Frequent admissions due to noncompliance with diet and medications. -Question of compliance with medications at home, cardiology consulted. -Patient seen in the ED 24 times in the past 6 months and he was admitted to the hospital 12 times. -Continue gentle diuresis because of mild bump in the creatinine.  Nonsustained V. tach -13 beats of NSVT yesterday, his ejection fraction is 20-25%, normal potassium and magnesium. -Per cardiology was not considered for ICD because of compliance. -On high dose of Toprol-XL, per cardiology consider change Coreg, will switch to Coreg 25 mg twice a day.  Mild troponin elevation -He typically has elevated troponins, likely due to continually exacerbated CHF. -No chest pain, no acute changes on EKG, no plans for further workup.  Atrial  fibrillation -Continue metoprolol for rate control, continue anticoagulation with xarelto  Acute kidney injury -Creatinine baseline is 0.9, creatinine went up to 1.43 this morning likely secondary to diuresis. -Continue to hold lisinopril but continue diuresis as he has +3 pedal edema bilaterally. -Continue check BMP daily.   DVT prophylaxis: Xarelto  Code Status: Full code Family Communication: Patient only Disposition Plan: to be detrmined  Consultants:   None  Procedures:   None  Antimicrobials:   None     Objective: Vitals:   01/23/16 1458 01/23/16 2236 01/24/16 0500 01/24/16 0842  BP: (!) 149/103 (!) 129/92 (!) 154/98 (!) 128/105  Pulse: 95 86 69 (!) 112  Resp: 20 (!) 24 20   Temp: 98.4 F (36.9 C) 98.7 F (37.1 C) 98.7 F (37.1 C)   TempSrc: Oral Oral Oral   SpO2: 95% 98% 96%   Weight:   134.9 kg (297 lb 8 oz)   Height:        Intake/Output Summary (Last 24 hours) at 01/24/16 1414 Last data filed at 01/24/16 1306  Gross per 24 hour  Intake             1206 ml  Output             1300 ml  Net              -94 ml   Filed Weights   01/22/16 0442 01/23/16 0541 01/24/16 0500  Weight: (!) 138.1 kg (304 lb 8 oz) (!) 136.4 kg (300 lb 11.2 oz) 134.9 kg (297 lb 8 oz)    Examination:  General exam: Alert, awake, oriented x 3 Respiratory system: Clear to auscultation. Respiratory  effort normal. Cardiovascular system:RRR. No murmurs, rubs, gallops. Gastrointestinal system: Abdomen is nondistended, soft and nontender. No organomegaly or masses felt. Normal bowel sounds heard. Central nervous system: Alert and oriented. No focal neurological deficits. Extremities: +3 pitting edema bilaterally     Data Reviewed: I have personally reviewed following labs and imaging studies  CBC:  Recent Labs Lab 01/20/16 1851 01/21/16 0541  WBC 4.2 7.8  NEUTROABS 2.8  --   HGB 10.5* 10.4*  HCT 32.6* 32.5*  MCV 83.6 83.8  PLT 181 180   Basic Metabolic  Panel:  Recent Labs Lab 01/20/16 1851 01/21/16 0541 01/22/16 0539 01/23/16 0535 01/24/16 0544  NA 137 142 141 140 140  K 3.4* 3.5 3.7 3.6 3.6  CL 111 111 108 106 107  CO2 22 25 23 28 27   GLUCOSE 90 90 92 88 91  BUN 18 20 27* 33* 30*  CREATININE 0.97 1.15 1.43* 1.37* 1.14  CALCIUM 8.4* 8.3* 8.3* 8.3* 7.9*  MG  --   --   --  1.8  --    GFR: Estimated Creatinine Clearance: 113.9 mL/min (by C-G formula based on SCr of 1.14 mg/dL). Liver Function Tests:  Recent Labs Lab 01/21/16 0541  AST 23  ALT 18  ALKPHOS 56  BILITOT 2.5*  PROT 6.0*  ALBUMIN 3.2*   No results for input(s): LIPASE, AMYLASE in the last 168 hours. No results for input(s): AMMONIA in the last 168 hours. Coagulation Profile: No results for input(s): INR, PROTIME in the last 168 hours. Cardiac Enzymes:  Recent Labs Lab 01/20/16 1851  TROPONINI 0.03*   BNP (last 3 results) No results for input(s): PROBNP in the last 8760 hours. HbA1C: No results for input(s): HGBA1C in the last 72 hours. CBG: No results for input(s): GLUCAP in the last 168 hours. Lipid Profile: No results for input(s): CHOL, HDL, LDLCALC, TRIG, CHOLHDL, LDLDIRECT in the last 72 hours. Thyroid Function Tests: No results for input(s): TSH, T4TOTAL, FREET4, T3FREE, THYROIDAB in the last 72 hours. Anemia Panel: No results for input(s): VITAMINB12, FOLATE, FERRITIN, TIBC, IRON, RETICCTPCT in the last 72 hours. Urine analysis:    Component Value Date/Time   COLORURINE YELLOW 01/17/2016 1029   APPEARANCEUR CLEAR 01/17/2016 1029   LABSPEC 1.020 01/17/2016 1029   PHURINE 6.0 01/17/2016 1029   GLUCOSEU NEGATIVE 01/17/2016 1029   GLUCOSEU NEG mg/dL 16/03/9603 5409   HGBUR TRACE (A) 01/17/2016 1029   BILIRUBINUR NEGATIVE 01/17/2016 1029   KETONESUR NEGATIVE 01/17/2016 1029   PROTEINUR 100 (A) 01/17/2016 1029   UROBILINOGEN 0.2 08/08/2014 1445   NITRITE NEGATIVE 01/17/2016 1029   LEUKOCYTESUR NEGATIVE 01/17/2016 1029   Sepsis  Labs: @LABRCNTIP (procalcitonin:4,lacticidven:4)  )No results found for this or any previous visit (from the past 240 hour(s)).       Radiology Studies: No results found.      Scheduled Meds: . atorvastatin  80 mg Oral Daily  . furosemide  40 mg Intravenous Q12H  . magnesium oxide  400 mg Oral Daily  . metoprolol succinate  100 mg Oral BID  . potassium chloride SA  20 mEq Oral BID  . rivaroxaban  20 mg Oral q morning - 10a  . sodium chloride flush  3 mL Intravenous Q12H  . sodium chloride flush  3 mL Intravenous Q12H   Continuous Infusions:    LOS: 3 days    Time spent: 25 minutes . Greater than 50% of this time was spent in direct contact with the patient coordinating care.  Clint Lipps, MD [facility] Pager [phone]  If 7PM-7AM, please contact night-coverage [email or web address] Password [email or web address] 01/24/2016, 2:14 PM

## 2016-01-25 LAB — BASIC METABOLIC PANEL
Anion gap: 10 (ref 5–15)
BUN: 30 mg/dL — ABNORMAL HIGH (ref 6–20)
CALCIUM: 9.1 mg/dL (ref 8.9–10.3)
CO2: 33 mmol/L — AB (ref 22–32)
CREATININE: 1.29 mg/dL — AB (ref 0.61–1.24)
Chloride: 99 mmol/L — ABNORMAL LOW (ref 101–111)
GFR calc non Af Amer: >60 mL/min (ref >60)
Glucose, Bld: 92 mg/dL (ref 65–99)
Potassium: 3 mmol/L — ABNORMAL LOW (ref 3.5–5.1)
Sodium: 142 mmol/L (ref 135–145)

## 2016-01-25 LAB — MAGNESIUM: MAGNESIUM: 2 mg/dL (ref 1.7–2.4)

## 2016-01-25 MED ORDER — ISOSORBIDE MONONITRATE ER 60 MG PO TB24
60.0000 mg | ORAL_TABLET | Freq: Every day | ORAL | Status: DC
Start: 2016-01-25 — End: 2016-01-25
  Administered 2016-01-25: 60 mg via ORAL
  Filled 2016-01-25: qty 1

## 2016-01-25 MED ORDER — ISOSORB DINITRATE-HYDRALAZINE 20-37.5 MG PO TABS
1.0000 | ORAL_TABLET | Freq: Three times a day (TID) | ORAL | Status: DC
  Filled 2016-01-25 (×7): qty 1

## 2016-01-25 MED ORDER — METOLAZONE 5 MG PO TABS: 2.5000 mg | ORAL_TABLET | Freq: Every day | ORAL | Status: DC

## 2016-01-25 MED ORDER — FUROSEMIDE 80 MG PO TABS: 80.0000 mg | ORAL_TABLET | Freq: Two times a day (BID) | ORAL | Status: DC

## 2016-01-25 MED ORDER — POTASSIUM CHLORIDE CRYS ER 20 MEQ PO TBCR
60.0000 meq | EXTENDED_RELEASE_TABLET | Freq: Once | ORAL | Status: AC
  Administered 2016-01-25: 60 meq via ORAL
  Filled 2016-01-25: qty 3

## 2016-01-25 MED ORDER — TORSEMIDE 20 MG PO TABS: 60.0000 mg | ORAL_TABLET | Freq: Two times a day (BID) | ORAL | Status: DC

## 2016-01-25 NOTE — Progress Notes (Signed)
PROGRESS NOTE    DALVIN CONGER  IDH:[id number] DOB: 1966-03-17 DOA: 01/20/2016 PCP: Pearson Grippe, MD   Subjective: Denies any complaints this morning, feel much better.  Brief Narrative:  50 year old man admitted on 7/31 from home due to increased lower extremity edema. He is well-known to our hospital for frequent admissions due to combined CHF and noncompliance with medications and diet.  Assessment & Plan:   Principal Problem:   Acute on chronic systolic congestive heart failure (HCC) Active Problems:   Hyperlipidemia   Essential hypertension   Noncompliance with medication regimen   Hypokalemia   Chronic atrial fibrillation (HCC)   Nonischemic cardiomyopathy (HCC)   Morbid obesity due to excess calories [facility])   Coronary arteries, normal Dec 2016   Chronic anticoagulation-Xarelto   Chronic renal insufficiency, stage III (moderate)   NSVT (nonsustained ventricular tachycardia) (HCC)   Acute on chronic congestive heart failure (HCC)   Acute on chronic combined CHF -Continue IV Lasix, he is 3.0 L negative since admission.  -He remains markedly volume overloaded on exam. -Frequent admissions due to noncompliance with diet and medications. -Question of compliance with medications at home, cardiology consulted. -Patient seen in the ED 24 times in the past 6 months and he was admitted to the hospital 12 times. -According to the chart he is negative >10 L, will switch to oral Demadex and low-dose Zaroxolyn. -Watched overnight on oral diuretics, evaluate for discharge in a.m. -Continue to hold lisinopril, I will add low-dose BiDil.  Nonsustained V. tach -13 beats of NSVT yesterday, his ejection fraction is 20-25%, normal potassium and magnesium. -Per cardiology was not considered for ICD because of compliance. -Was on high dose of Toprol-XL, cardiology recommended change to Coreg, switched to Coreg 25 mg twice a day.  Mild troponin elevation -He typically has elevated troponins,  likely due to continually exacerbated CHF. -No chest pain, no acute changes on EKG, no plans for further workup.  Atrial fibrillation -Continue beta blockers for rate control, continue anticoagulation with Xarelto -Seems like Coreg is doing better rate control and the Toprol-XL.  Acute kidney injury -Creatinine baseline is 0.9, creatinine went up to 1.43 this morning likely secondary to diuresis. -Continue to hold lisinopril but continue diuresis as he has +3 pedal edema bilaterally. -Continue check BMP daily.   DVT prophylaxis: Xarelto  Code Status: Full code Family Communication: Patient only Disposition Plan: to be detrmined  Consultants:   None  Procedures:   None  Antimicrobials:   None     Objective: Vitals:   01/24/16 1400 01/24/16 1748 01/24/16 2317 01/25/16 0500  BP: (!) 120/93 (!) 151/126 (!) 130/97 (!) 141/94  Pulse: 81 89 (!) 48 (!) 55  Resp: 18  20   Temp: 98.1 F (36.7 C)  98.4 F (36.9 C) 98 F (36.7 C)  TempSrc: Oral  Oral Oral  SpO2: 95%  100% 99%  Weight:    129.7 kg (286 lb)  Height:        Intake/Output Summary (Last 24 hours) at 01/25/16 1048 Last data filed at 01/25/16 0500  Gross per 24 hour  Intake              480 ml  Output             4700 ml  Net            -4220 ml   Filed Weights   01/23/16 0541 01/24/16 0500 01/25/16 0500  Weight: (!) 136.4 kg (300 lb 11.2 oz)  134.9 kg (297 lb 8 oz) 129.7 kg (286 lb)    Examination:  General exam: Alert, awake, oriented x 3 Respiratory system: Clear to auscultation. Respiratory effort normal. Cardiovascular system:RRR. No murmurs, rubs, gallops. Gastrointestinal system: Abdomen is nondistended, soft and nontender. No organomegaly or masses felt. Normal bowel sounds heard. Central nervous system: Alert and oriented. No focal neurological deficits. Extremities: +3 pitting edema bilaterally     Data Reviewed: I have personally reviewed following labs and imaging  studies  CBC:  Recent Labs Lab 01/20/16 1851 01/21/16 0541  WBC 4.2 7.8  NEUTROABS 2.8  --   HGB 10.5* 10.4*  HCT 32.6* 32.5*  MCV 83.6 83.8  PLT 181 180   Basic Metabolic Panel:  Recent Labs Lab 01/21/16 0541 01/22/16 0539 01/23/16 0535 01/24/16 0544 01/25/16 0602  NA 142 141 140 140 142  K 3.5 3.7 3.6 3.6 3.0*  CL 111 108 106 107 99*  CO2 33*  GLUCOSE 90 92 88 91 92  BUN 20 27* 33* 30* 30*  CREATININE 1.15 1.43* 1.37* 1.14 1.29*  CALCIUM 8.3* 8.3* 8.3* 7.9* 9.1  MG  --   --  1.8  --  2.0   GFR: Estimated Creatinine Clearance: 98.5 mL/min (by C-G formula based on SCr of 1.29 mg/dL). Liver Function Tests:  Recent Labs Lab 01/21/16 0541  AST 23  ALT 18  ALKPHOS 56  BILITOT 2.5*  PROT 6.0*  ALBUMIN 3.2*   No results for input(s): LIPASE, AMYLASE in the last 168 hours. No results for input(s): AMMONIA in the last 168 hours. Coagulation Profile: No results for input(s): INR, PROTIME in the last 168 hours. Cardiac Enzymes:  Recent Labs Lab 01/20/16 1851  TROPONINI 0.03*   BNP (last 3 results) No results for input(s): PROBNP in the last 8760 hours. HbA1C: No results for input(s): HGBA1C in the last 72 hours. CBG: No results for input(s): GLUCAP in the last 168 hours. Lipid Profile: No results for input(s): CHOL, HDL, LDLCALC, TRIG, CHOLHDL, LDLDIRECT in the last 72 hours. Thyroid Function Tests: No results for input(s): TSH, T4TOTAL, FREET4, T3FREE, THYROIDAB in the last 72 hours. Anemia Panel: No results for input(s): VITAMINB12, FOLATE, FERRITIN, TIBC, IRON, RETICCTPCT in the last 72 hours. Urine analysis:    Component Value Date/Time   COLORURINE YELLOW 01/17/2016 1029   APPEARANCEUR CLEAR 01/17/2016 1029   LABSPEC 1.020 01/17/2016 1029   PHURINE 6.0 01/17/2016 1029   GLUCOSEU NEGATIVE 01/17/2016 1029   GLUCOSEU NEG mg/dL 16/03/9603 5409   HGBUR TRACE (A) 01/17/2016 1029   BILIRUBINUR NEGATIVE 01/17/2016 1029   KETONESUR  NEGATIVE 01/17/2016 1029   PROTEINUR 100 (A) 01/17/2016 1029   UROBILINOGEN 0.2 08/08/2014 1445   NITRITE NEGATIVE 01/17/2016 1029   LEUKOCYTESUR NEGATIVE 01/17/2016 1029   Sepsis Labs: (procalcitonin:4,lacticidven:4)  )No results found for this or any previous visit (from the past 240 hour(s)).       Radiology Studies: No results found.      Scheduled Meds: . atorvastatin  80 mg Oral Daily  . carvedilol  25 mg Oral BID WC  . magnesium oxide  400 mg Oral Daily  . [START ON 01/26/2016] metolazone  2.5 mg Oral Daily  . potassium chloride SA  40 mEq Oral BID  . potassium chloride  60 mEq Oral Once  . rivaroxaban  20 mg Oral q morning - 10a  . sodium chloride flush  3 mL Intravenous Q12H  . sodium chloride flush  3 mL  Intravenous Q12H  . torsemide  60 mg Oral BID   Continuous Infusions:    LOS: 4 days    Time spent: 25 minutes . Greater than 50% of this time was spent in direct contact with the patient coordinating care.     Clint Lipps, MD [facility] Pager [phone]  If 7PM-7AM, please contact night-coverage [email or web address] Password [email or web address] 01/25/2016, 10:48 AM

## 2016-01-25 NOTE — Progress Notes (Signed)
Patient left AMA. Explained AMA papers to patient and consequences of leaving AMA.  Let the MD know.  Patient sister arrived and talked to the patient about a medication she did not think he should be on. As a result, the patient decided to leave AMA, even though he never actually received a dose of this medication.  Patient IV removed and site intact. Patient left hospital with all personal belongings.

## 2016-01-25 NOTE — Significant Event (Signed)
I was notified by RN that pt wants to leave AMA. This happened soon after he was visited by his sister, unclear if this related or not.  Clint Lipps Pager: [phone] 01/25/2016, 6:10 PM

## 2016-01-25 NOTE — Significant Event (Signed)
Started on BiDil, pt was very argumentative to the nurse that he is allergic to hydralazine and he will not take this medication. I spoke to him over the phone and he reported tachycardia but no other symptoms. I explained to him this what we call intolerance rather than allergy, and now as he is on Coreg his heart rate is very controlled. He refused to take the BiDil so medication discontinued, Imdur started to help with the blood pressure. Continue to monitor weight and urine out put.  Clint Lipps Pager: [phone] 01/25/2016, 2:03 PM

## 2016-01-25 NOTE — Discharge Summary (Signed)
Physician Discharge Summary  [name] ZOX:[id number] DOB: October 02, 1965 DOA: 01/20/2016  PCP: Pearson Grippe, MD  Admit date: 01/20/2016 Discharge date: 01/25/2016  Admitted From: Home Disposition: ??  Recommendations for Outpatient Follow-up:  1. Left AMA  Home Health  Equipment/Devices:   Discharge Condition:  CODE STATUS:  Diet recommendation:    Brief/Interim Summary:  [name]  is a 50 y.o. male, History of nonischemic cardiomyopathy, pulmonary hypertension, hypertension, obstructive sleep apnea, noncompliance with medications, chronic atrial fibrillation, anticoagulation with Xarelto, who came to the ED with worsening leg swelling and shortness of breath. He denies chest pain. No coughing up any phlegm. Had 2 episodes of nausea and vomiting yesterday. Denies diarrhea.  Discharge Diagnoses:  Principal Problem:   Acute on chronic systolic congestive heart failure (HCC) Active Problems:   Hyperlipidemia   Essential hypertension   Noncompliance with medication regimen   Hypokalemia   Chronic atrial fibrillation (HCC)   Nonischemic cardiomyopathy (HCC)   Morbid obesity due to excess calories [facility])   Coronary arteries, normal Dec 2016   Chronic anticoagulation-Xarelto   Chronic renal insufficiency, stage III (moderate)   NSVT (nonsustained ventricular tachycardia) (HCC)   Acute on chronic congestive heart failure (HCC)  He left AMA.   Discharge Instructions   Medications was not reconciled he left AMA    Allergies  Allergen Reactions  . Bee Venom Shortness Of Breath and Swelling  . Lipitor [Atorvastatin] Shortness Of Breath and Other (See Comments)    Nose bleed  . Aspirin Other (See Comments)    Chewable 81 mg tablets upsets patients stomach  . Banana Diarrhea  . Diltiazem Other (See Comments)    Makes heart beat fast  . Hydralazine Other (See Comments)    Patient states it causes Tachycardia    Consultations:  Cards   Procedures/Studies: Dg Chest 2  View  Result Date: 01/20/2016 CLINICAL DATA:  50 year old male with shortness of breath, chest pain, cough, nausea and fever for 2 days. Initial encounter. EXAM: CHEST  2 VIEW COMPARISON:  01/17/2016 and earlier. FINDINGS: Continued indistinct appearance of pulmonary vasculature throughout. Stable cardiomegaly and mediastinal contours. Small volume fluid tracking in the major fissures. No other layering pleural effusion. No pneumothorax. No confluent opacity other than crowding of markings at both lung bases today - and the previously questioned right upper lobe opacity has resolved. Visualized tracheal air column is within normal limits. No acute osseous abnormality identified. IMPRESSION: Appearance most compatible with pulmonary edema with small volume pleural fluid. Electronically Signed   By: Odessa Fleming M.D.   On: 01/20/2016 20:04   Dg Chest 2 View  Result Date: 01/17/2016 CLINICAL DATA:  Shortness of breath with lower extremity edema EXAM: CHEST  2 VIEW COMPARISON:  January 05, 2016 FINDINGS: There is a small focus of opacity in the right upper lobe near a monitor lead, concerning for a small focus of pneumonia. There is persistent cardiomegaly with pulmonary venous hypertension. There is no appreciable pulmonary edema or pleural effusion. No adenopathy. There is degenerative change in the thoracic spine. IMPRESSION: Small focus of increased opacity in the periphery of the right upper lobe, concerning for a small focus of pneumonia. A monitor lead in this area potentially may be contributing to the increased opacity in this area. On subsequent evaluation, monitor lead ideally should be placed elsewhere to more precisely assess this area. There is underlying pulmonary vascular congestion without edema or pleural effusions evident. Electronically Signed   By: Bretta Bang III M.D.  On: 01/17/2016 09:57  Dg Chest 2 View  Result Date: 01/05/2016 CLINICAL DATA:  50 year old male with edema and bilateral  lower extremity swelling. EXAM: CHEST  2 VIEW COMPARISON:  Chest radiograph dated 01/02/2016 FINDINGS: There is stable mild cardiomegaly. There is mild pulmonary vascular congestion without edema. There is no focal consolidation, pleural effusion, or pneumothorax. No acute osseous pathology. IMPRESSION: Stable cardiomegaly with mild congestive changes. No focal consolidation or edema. Electronically Signed   By: Elgie Collard M.D.   On: 01/05/2016 02:36   Dg Chest 2 View  Result Date: 01/02/2016 CLINICAL DATA:  Shortness of breath for 2 weeks EXAM: CHEST  2 VIEW COMPARISON:  01/02/2016 FINDINGS: Mild bilateral interstitial thickening. There is no focal parenchymal opacity. There is no pleural effusion or pneumothorax. There is stable cardiomegaly. The osseous structures are unremarkable. IMPRESSION: Cardiomegaly with mild pulmonary vascular congestion. Electronically Signed   By: Elige Ko   On: 01/02/2016 21:24   Dg Chest 2 View  Result Date: 01/02/2016 CLINICAL DATA:  Shortness of breath and fluid retention. EXAM: CHEST  2 VIEW COMPARISON:  12/21/2015. FINDINGS: Low volumes. The cardio pericardial silhouette is enlarged. There is pulmonary vascular congestion without overt pulmonary edema. A component of interstitial edema at the bases is suspected. The visualized bony structures of the thorax are intact. Telemetry leads overlie the chest. IMPRESSION: Low volume film with cardiomegaly and interstitial pulmonary edema. Electronically Signed   By: Kennith Center M.D.   On: 01/02/2016 12:13   Ct Head Wo Contrast  Result Date: 01/17/2016 CLINICAL DATA:  Shortness of breath, dizziness and altered mental status for 3 days. Initial encounter. EXAM: CT HEAD WITHOUT CONTRAST TECHNIQUE: Contiguous axial images were obtained from the base of the skull through the vertex without intravenous contrast. COMPARISON:  None. FINDINGS: The brain appears normal without hemorrhage, infarct, mass lesion, mass effect,  midline shift or abnormal extra-axial fluid collection. No hydrocephalus or pneumocephalus. The calvarium is intact. Mucous retention cyst or polyp left maxillary sinus is identified. IMPRESSION: No acute intracranial abnormality. Mucous retention cyst or polyp left maxillary sinus. Electronically Signed   By: Drusilla Kanner M.D.   On: 01/17/2016 10:37  Ct Abdomen Pelvis W Contrast  Result Date: 01/05/2016 CLINICAL DATA:  Bilateral lower extremity swelling starting 3 days ago. Left lower quadrant pain. EXAM: CT ABDOMEN AND PELVIS WITH CONTRAST TECHNIQUE: Multidetector CT imaging of the abdomen and pelvis was performed using the standard protocol following bolus administration of intravenous contrast. CONTRAST:  ISOVUE-300 IOPAMIDOL (ISOVUE-300) INJECTION 61% COMPARISON:  None. FINDINGS: Lower chest: Patchy airspace disease in both lung bases suggesting probable edema. Heart size is enlarged. Residual contrast material in the esophagus may represent reflux or dysmotility. Hepatobiliary: No masses or other significant abnormality. Pancreas: No mass, inflammatory changes, or other significant abnormality. Spleen: Within normal limits in size and appearance. Adrenals/Urinary Tract: No masses identified. No evidence of hydronephrosis. Bilateral renal cysts. Stomach/Bowel: No evidence of obstruction, inflammatory process, or abnormal fluid collections. Vascular/Lymphatic: No pathologically enlarged lymph nodes. No evidence of abdominal aortic aneurysm. Reproductive: No mass or other significant abnormality. Other: None. Musculoskeletal: No suspicious bone lesions identified. Schmorl's node at T11. IMPRESSION: Cardiac enlargement with airspace disease in both lung bases suggesting heart failure with edema. No acute process demonstrated in the abdomen or pelvis. Electronically Signed   By: Burman Nieves M.D.   On: 01/05/2016 05:08    (Echo, Carotid, EGD, Colonoscopy, ERCP)    Subjective:   Discharge  Exam: Vitals:  01/25/16 0500 01/25/16 1300  BP: (!) 141/94 103/66  Pulse: (!) 55 88  Resp:  20  Temp: 98 F (36.7 C) 97.9 F (36.6 C)   Vitals:   01/24/16 1748 01/24/16 2317 01/25/16 0500 01/25/16 1300  BP: (!) 151/126 (!) 130/97 (!) 141/94 103/66  Pulse: 89 (!) 48 (!) 55 88  Resp:  20  20  Temp:  98.4 F (36.9 C) 98 F (36.7 C) 97.9 F (36.6 C)  TempSrc:  Oral Oral Oral  SpO2:  100% 99% 95%  Weight:   129.7 kg (286 lb)   Height:        General: Pt is alert, awake, not in acute distress Cardiovascular: RRR, S1/S2 +, no rubs, no gallops Respiratory: CTA bilaterally, no wheezing, no rhonchi Abdominal: Soft, NT, ND, bowel sounds + Extremities: no edema, no cyanosis    The results of significant diagnostics from this hospitalization (including imaging, microbiology, ancillary and laboratory) are listed below for reference.     Microbiology: No results found for this or any previous visit (from the past 240 hour(s)).   Labs: BNP (last 3 results)  Recent Labs  01/17/16 0949 01/20/16 1851 01/24/16 0908  BNP 934.0* 1,039.0* 755.0*   Basic Metabolic Panel:  Recent Labs Lab 01/21/16 0541 01/22/16 0539 01/23/16 0535 01/24/16 0544 01/25/16 0602  NA 142 141 140 140 142  K 3.5 3.7 3.6 3.6 3.0*  CL 111 108 106 107 99*  CO2 25 23 28 27  33*  GLUCOSE 90 92 88 91 92  BUN 20 27* 33* 30* 30*  CREATININE 1.15 1.43* 1.37* 1.14 1.29*  CALCIUM 8.3* 8.3* 8.3* 7.9* 9.1  MG  --   --  1.8  --  2.0   Liver Function Tests:  Recent Labs Lab 01/21/16 0541  AST 23  ALT 18  ALKPHOS 56  BILITOT 2.5*  PROT 6.0*  ALBUMIN 3.2*   No results for input(s): LIPASE, AMYLASE in the last 168 hours. No results for input(s): AMMONIA in the last 168 hours. CBC:  Recent Labs Lab 01/20/16 1851 01/21/16 0541  WBC 4.2 7.8  NEUTROABS 2.8  --   HGB 10.5* 10.4*  HCT 32.6* 32.5*  MCV 83.6 83.8  PLT 181 180   Cardiac Enzymes:  Recent Labs Lab 01/20/16 1851  TROPONINI 0.03*    BNP: Invalid input(s): POCBNP CBG: No results for input(s): GLUCAP in the last 168 hours. D-Dimer No results for input(s): DDIMER in the last 72 hours. Hgb A1c No results for input(s): HGBA1C in the last 72 hours. Lipid Profile No results for input(s): CHOL, HDL, LDLCALC, TRIG, CHOLHDL, LDLDIRECT in the last 72 hours. Thyroid function studies No results for input(s): TSH, T4TOTAL, T3FREE, THYROIDAB in the last 72 hours.  Invalid input(s): FREET3 Anemia work up No results for input(s): VITAMINB12, FOLATE, FERRITIN, TIBC, IRON, RETICCTPCT in the last 72 hours. Urinalysis    Component Value Date/Time   COLORURINE YELLOW 01/17/2016 1029   APPEARANCEUR CLEAR 01/17/2016 1029   LABSPEC 1.020 01/17/2016 1029   PHURINE 6.0 01/17/2016 1029   GLUCOSEU NEGATIVE 01/17/2016 1029   GLUCOSEU NEG mg/dL 16/03/9603 5409   HGBUR TRACE (A) 01/17/2016 1029   BILIRUBINUR NEGATIVE 01/17/2016 1029   KETONESUR NEGATIVE 01/17/2016 1029   PROTEINUR 100 (A) 01/17/2016 1029   UROBILINOGEN 0.2 08/08/2014 1445   NITRITE NEGATIVE 01/17/2016 1029   LEUKOCYTESUR NEGATIVE 01/17/2016 1029   Sepsis Labs Invalid input(s): PROCALCITONIN,  WBC,  LACTICIDVEN Microbiology No results found for this or any previous  visit (from the past 240 hour(s)).   Time coordinating discharge: Over 30 minutes  SIGNED:   Clint Lipps, MD  [facility] 01/25/2016, 6:21 PM Pager   If 7PM-7AM, please contact night-coverage [email or web address] Password [email or web address]

## 2016-01-31 ENCOUNTER — Emergency Department (HOSPITAL_COMMUNITY): Payer: Medicare Other

## 2016-01-31 ENCOUNTER — Emergency Department (HOSPITAL_COMMUNITY)
Admission: EM | Admit: 2016-01-31 | Discharge: 2016-01-31 | Disposition: A | Discharge status: Home / Self Care | Payer: Medicare Other | Attending: Emergency Medicine | Admitting: Emergency Medicine

## 2016-01-31 ENCOUNTER — Encounter (HOSPITAL_COMMUNITY): Payer: Self-pay | Admitting: Emergency Medicine

## 2016-01-31 ENCOUNTER — Emergency Department (HOSPITAL_COMMUNITY)
Admission: EM | Admit: 2016-01-31 | Discharge: 2016-01-31 | Disposition: A | Discharge status: Home / Self Care | Payer: Medicare Other | Source: Home / Self Care | Attending: Emergency Medicine | Admitting: Emergency Medicine

## 2016-01-31 DIAGNOSIS — Z79899 Other long term (current) drug therapy: Secondary | ICD-10-CM

## 2016-01-31 DIAGNOSIS — M7989 Other specified soft tissue disorders: Secondary | ICD-10-CM | POA: Diagnosis present

## 2016-01-31 DIAGNOSIS — Z87891 Personal history of nicotine dependence: Secondary | ICD-10-CM

## 2016-01-31 DIAGNOSIS — N183 Chronic kidney disease, stage 3 (moderate): Secondary | ICD-10-CM

## 2016-01-31 DIAGNOSIS — I130 Hypertensive heart and chronic kidney disease with heart failure and stage 1 through stage 4 chronic kidney disease, or unspecified chronic kidney disease: Secondary | ICD-10-CM | POA: Insufficient documentation

## 2016-01-31 DIAGNOSIS — J45909 Unspecified asthma, uncomplicated: Secondary | ICD-10-CM | POA: Insufficient documentation

## 2016-01-31 DIAGNOSIS — R609 Edema, unspecified: Secondary | ICD-10-CM

## 2016-01-31 DIAGNOSIS — I5043 Acute on chronic combined systolic (congestive) and diastolic (congestive) heart failure: Secondary | ICD-10-CM

## 2016-01-31 DIAGNOSIS — R0602 Shortness of breath: Secondary | ICD-10-CM | POA: Insufficient documentation

## 2016-01-31 DIAGNOSIS — I110 Hypertensive heart disease with heart failure: Secondary | ICD-10-CM | POA: Diagnosis not present

## 2016-01-31 LAB — CBC WITH DIFFERENTIAL/PLATELET
BASOS ABS: 0 10*3/uL (ref 0.0–0.1)
Basophils Relative: 1 %
EOS PCT: 3 %
Eosinophils Absolute: 0.1 10*3/uL (ref 0.0–0.7)
HCT: 37.3 % — ABNORMAL LOW (ref 39.0–52.0)
Hemoglobin: 11.9 g/dL — ABNORMAL LOW (ref 13.0–17.0)
LYMPHS PCT: 22 %
Lymphs Abs: 0.8 10*3/uL (ref 0.7–4.0)
MCH: 26.9 pg (ref 26.0–34.0)
MCHC: 31.9 g/dL (ref 30.0–36.0)
MCV: 84.4 fL (ref 78.0–100.0)
MONO ABS: 0.2 10*3/uL (ref 0.1–1.0)
Monocytes Relative: 7 %
Neutro Abs: 2.4 10*3/uL (ref 1.7–7.7)
Neutrophils Relative %: 67 %
PLATELETS: 292 10*3/uL (ref 150–400)
RBC: 4.42 MIL/uL (ref 4.22–5.81)
RDW: 15.6 % — AB (ref 11.5–15.5)
WBC: 3.5 10*3/uL — ABNORMAL LOW (ref 4.0–10.5)

## 2016-01-31 LAB — BASIC METABOLIC PANEL
Anion gap: 6 (ref 5–15)
BUN: 18 mg/dL (ref 6–20)
CO2: 28 mmol/L (ref 22–32)
CREATININE: 1.03 mg/dL (ref 0.61–1.24)
Calcium: 8.6 mg/dL — ABNORMAL LOW (ref 8.9–10.3)
Chloride: 106 mmol/L (ref 101–111)
GFR calc Af Amer: >60 mL/min (ref >60)
GLUCOSE: 124 mg/dL — AB (ref 65–99)
POTASSIUM: 3 mmol/L — AB (ref 3.5–5.1)
Sodium: 140 mmol/L (ref 135–145)

## 2016-01-31 LAB — BRAIN NATRIURETIC PEPTIDE: B NATRIURETIC PEPTIDE 5: 606 pg/mL — AB (ref 0.0–100.0)

## 2016-01-31 MED ORDER — FUROSEMIDE 10 MG/ML IJ SOLN
80.0000 mg | Freq: Once | INTRAMUSCULAR | Status: AC
  Administered 2016-01-31: 80 mg via INTRAVENOUS
  Filled 2016-01-31: qty 8

## 2016-01-31 NOTE — ED Notes (Signed)
Mesner MD at bedside.  

## 2016-01-31 NOTE — ED Provider Notes (Signed)
[facility]-EMERGENCY DEPT Provider Note   CSN: [id number] Arrival date & time: 01/31/16  1204  First Provider Contact:  First MD Initiated Contact with Patient 01/31/16 1306        History   Chief Complaint Chief Complaint  Patient presents with  . Shortness of Breath    HPI [name] is a 50 y.o. male.  Patient presents today with a complaint of shortness of breath. Patient recently discharged from the hospital on August 5 for congestive heart failure. Patient has frequent admissions for this. Asians states his shortness breath is worse with walking in his legs have significant swelling. States he is taking his Lasix and taking his potassium supplement and taking Xarelto. Patient denies any chest pain.      Past Medical History:  Diagnosis Date  . Allergic rhinitis   . Asthma   . Atrial fibrillation (HCC)   . Essential hypertension   . Gastroesophageal reflux disease   . History of cardiac catheterization    Normal coronaries December 2016  . Noncompliance    Major problem leading to declining health and recurrent hospitalization  . Nonischemic cardiomyopathy (HCC)    LVEF 20-25%  . OSA (obstructive sleep apnea)   . Osteoarthritis   . Peptic ulcer disease     Patient Active Problem List   Diagnosis Date Noted  . Acute on chronic congestive heart failure (HCC)   . Coronary arteries, normal Dec 2016 01/23/2016  . Chronic anticoagulation-Xarelto 01/23/2016  . Chronic renal insufficiency, stage III (moderate) 01/23/2016  . NSVT (nonsustained ventricular tachycardia) (HCC) 01/23/2016  . CHF (congestive heart failure) (HCC) 01/20/2016  . CHF exacerbation (HCC) 01/20/2016  . Acute systolic CHF (congestive heart failure) (HCC) 12/21/2015  . Acute on chronic combined systolic and diastolic CHF (congestive heart failure) (HCC) 12/21/2015  . Elevated troponin 12/21/2015  . Congestive heart failure (CHF) (HCC) 12/03/2015  . Acute on chronic combined systolic (congestive)  and diastolic (congestive) heart failure (HCC)   . Nonischemic cardiomyopathy (HCC)   . Morbid obesity due to excess calories (HCC)   . Noncompliance with diet and medication regimen 12/02/2015  . Acute on chronic systolic congestive heart failure (HCC)   . Acute diastolic CHF (congestive heart failure) (HCC) 11/18/2015  . Acute renal failure superimposed on stage 3 chronic kidney disease (HCC) 10/28/2015  . Acute on chronic combined systolic and diastolic heart failure (HCC) 10/08/2015  . OSA (obstructive sleep apnea) 09/20/2015  . Pulmonary hypertension (HCC) 09/19/2015  . Leukopenia 09/19/2015  . Normocytic anemia 09/19/2015  . Chronic atrial fibrillation (HCC)   . Dyspnea on exertion 05/28/2015  . Acute respiratory failure with hypoxia (HCC) 04/01/2015  . Pneumonia 04/01/2015  . Hypokalemia 04/01/2015  . Obesity hypoventilation syndrome (HCC) 08/08/2014  . GERD (gastroesophageal reflux disease) 08/08/2014  . Noncompliance with medication regimen 06/28/2014  . Atrial fibrillation with rapid ventricular response (HCC) 06/28/2014  . Edema of right lower extremity 06/21/2014  . Fecal urgency 06/21/2014  . Asthma 02/11/2009  . Hyperlipidemia 07/12/2008  . OBESITY, MORBID 07/12/2008  . Anxiety state 07/12/2008  . DEPRESSION 07/12/2008  . Essential hypertension 07/12/2008  . ALLERGIC RHINITIS 07/12/2008  . GERD 07/12/2008  . Peptic ulcer 07/12/2008  . OSTEOARTHRITIS 07/12/2008    Past Surgical History:  Procedure Laterality Date  . CARDIAC CATHETERIZATION N/A 06/14/2015   Procedure: Right/Left Heart Cath and Coronary Angiography;  Surgeon: Runell Gess, MD;  [facility]: [facility] INVASIVE CV LAB;  Service: Cardiovascular;  Laterality: N/A;  Home Medications    Prior to Admission medications   Medication Sig Start Date End Date Taking? Authorizing Provider  atorvastatin (LIPITOR) 80 MG tablet Take 80 mg by mouth daily.   Yes Historical Provider, MD  beclomethasone (QVAR)  80 MCG/ACT inhaler Inhale 2 puffs into the lungs daily as needed (shortness of breath). 11/21/15  Yes Erick Blinks, MD  furosemide (LASIX) 80 MG tablet Take 1 tablet (80 mg total) by mouth 2 (two) times daily. 12/02/15  Yes Jodelle Gross, NP  lisinopril (PRINIVIL,ZESTRIL) 5 MG tablet Take 1 tablet (5 mg total) by mouth daily. 01/17/16  Yes Glynn Octave, MD  metoprolol succinate (TOPROL-XL) 100 MG 24 hr tablet Take 1 tablet (100 mg total) by mouth 2 (two) times daily. Take with or immediately following a meal. 01/17/16  Yes Glynn Octave, MD  potassium chloride SA (K-DUR,KLOR-CON) 20 MEQ tablet Take 2 tablets 2 times daily for 7 days and then 1 tablet 2 times daily thereafter. Patient taking differently: Take 20 mEq by mouth 2 (two) times daily.  01/09/16  Yes Elliot Cousin, MD  rivaroxaban (XARELTO) 20 MG TABS tablet Take 1 tablet (20 mg total) by mouth every evening. Patient taking differently: Take 20 mg by mouth every morning.  11/21/15  Yes Erick Blinks, MD    Family History Family History  Problem Relation Age of Onset  . Stroke Father   . Heart attack Father   . Aneurysm Mother     Cerebral aneurysm  . Hypertension Sister   . Colon cancer Neg Hx   . Inflammatory bowel disease Neg Hx   . Liver disease Neg Hx     Social History Social History  Substance Use Topics  . Smoking status: Former Smoker    Packs/day: 0.50    Years: 20.00    Types: Cigarettes    Start date: 04/26/1988    Quit date: 06/23/2007  . Smokeless tobacco: Never Used     Comment: 1 ppd former smoker  . Alcohol use No     Comment: No etoh since 2009     Allergies   Bee venom; Lipitor [atorvastatin]; Aspirin; Banana; Diltiazem; and Hydralazine   Review of Systems Review of Systems  Constitutional: Negative for fever.  HENT: Negative for congestion.   Respiratory: Positive for shortness of breath.   Cardiovascular: Positive for leg swelling. Negative for chest pain.  Gastrointestinal: Negative for  abdominal pain.  Genitourinary: Negative for dysuria.  Musculoskeletal: Negative for myalgias.  Skin: Negative for rash.  Neurological: Negative for headaches.  Hematological: Bruises/bleeds easily.  Psychiatric/Behavioral: Negative for confusion.     Physical Exam Updated Vital Signs BP (!) 147/103 (BP Location: Left Arm)   Pulse 92   Temp 98 F (36.7 C) (Oral)   Resp 19   Ht 6' (1.829 m)   Wt (!) 137.7 kg   SpO2 98%   BMI 41.16 kg/m   Physical Exam  Constitutional: He is oriented to person, place, and time. He appears well-developed and well-nourished.  HENT:  Head: Normocephalic and atraumatic.  Mouth/Throat: Oropharynx is clear and moist.  Cardiovascular: Normal rate.   Irregular rhythm  Pulmonary/Chest: Effort normal and breath sounds normal. No respiratory distress. He has no wheezes. He has no rales.  Abdominal: Soft. Bowel sounds are normal. There is no tenderness.  Musculoskeletal: Normal range of motion. He exhibits edema.  Neurological: He is alert and oriented to person, place, and time. No cranial nerve deficit. He exhibits normal muscle tone. Coordination normal.  Skin: Skin is warm.  Nursing note and vitals reviewed.    ED Treatments / Results  Labs (all labs ordered are listed, but only abnormal results are displayed) Labs Reviewed  BRAIN NATRIURETIC PEPTIDE - Abnormal; Notable for the following:       Result Value   B Natriuretic Peptide 606.0 (*)    All other components within normal limits  BASIC METABOLIC PANEL - Abnormal; Notable for the following:    Potassium 3.0 (*)    Glucose, Bld 124 (*)    Calcium 8.6 (*)    All other components within normal limits  CBC WITH DIFFERENTIAL/PLATELET - Abnormal; Notable for the following:    WBC 3.5 (*)    Hemoglobin 11.9 (*)    HCT 37.3 (*)    RDW 15.6 (*)    All other components within normal limits    EKG  EKG Interpretation  Date/Time:  Friday January 31 2016 12:07:14 EDT Ventricular Rate:    122 PR Interval:    QRS Duration: 90 QT Interval:  350 QTC Calculation: 498 R Axis:   55 Text Interpretation:  Atrial fibrillation Premature ventricular complexes Prolonged QT Abnormal ECG No significant change since last tracing Except rated increased Confirmed by Umberto Pavek  MD, Winna Golla ([id number]) on 01/31/2016 1:37:02 PM       Radiology Dg Chest 2 View  Result Date: 01/31/2016 CLINICAL DATA:  Shortness of breath for 2-3 days EXAM: CHEST  2 VIEW COMPARISON:  01/20/2016 FINDINGS: Cardiac shadow is enlarged. The lungs are well aerated bilaterally. No focal infiltrate or sizable effusion is seen. No bony abnormality is noted. IMPRESSION: No active cardiopulmonary disease. Electronically Signed   By: Alcide Clever M.D.   On: 01/31/2016 13:04    Procedures Procedures (including critical care time)  Medications Ordered in ED Medications - No data to display   Initial Impression / Assessment and Plan / ED Course  I have reviewed the triage vital signs and the nursing notes.  Pertinent labs & imaging results that were available during my care of the patient were reviewed by me and considered in my medical decision making (see chart for details).  Clinical Course    Patient seen frequently for shortness of breath. Patient known to have atrial fibrillation pre-significant congestive heart failure. On 80 mg of Lasix twice a day. On home potassium supplements. Patient recently with admission to the hospital for congestive heart failure at the end of July and was discharged home on August 5. Today chest x-rays negative for any pulmonary edema. Patient's blood pressures improved here. Patient's heart rate shows atrial fibrillation but is below 100. Patient's labs without any significant changes compared to his discharge labs. Patient is still taking his Lasix and his potassium supplement. Patient stable for discharge home will return for new or worse symptoms. Patient is also on Xarelto. And he has been  taking that.  Final Clinical Impressions(s) / ED Diagnoses   Final diagnoses:  SOB (shortness of breath)    New Prescriptions New Prescriptions   No medications on file     Vanetta Mulders, MD 01/31/16 1624

## 2016-01-31 NOTE — Discharge Instructions (Signed)
Continue your current medications to include the Lasix and the potassium supplements. Return for any new or worse shortness of breath. Make an appointment to follow-up with your regular doctor.

## 2016-01-31 NOTE — Progress Notes (Addendum)
ED CM reviewed record, patient has had 14 ED visits 12 resulting in hospital stays. Hx of HTN, CHF. A-Fib OSA, patient presented to [facility] ED with complaint SOB, patient was seen earlier today at [facility] for similar issue. CM met with patient at bedside. Patient confirmed  Dr. Maudie Mercury PCP, also reports being active with [facility] services on HF [facility]. Patient states, he was seen OP Cards office in [place] last week. Patient states, he is compliant with medication regimen. Discussed [facility] and the benefits of the [facility], patient is agreeable, referral placed.  CM encourages patient to follow up with PCP post ED visit concerning a referral for CPAP. Patient verbalizes understanding teach back. No further questions or concerns verbalized . No further CM needs

## 2016-01-31 NOTE — ED Triage Notes (Signed)
Pt arrives from home with [facility] for continued/worsening SOB; EMS states pt was seen at [facility] for same and discharged home; pt reports felt worse at home and called 911; pt has hx of CHF, MI, Afib, and HTN; EMS gave supplemental O2 and transferred to [facility]; pt states he has not had his home medications today

## 2016-01-31 NOTE — ED Notes (Signed)
Patient transported to X-ray 

## 2016-01-31 NOTE — Discharge Instructions (Signed)
Please double your home dose of Lasix for the next 3 days. Please follow instructions given to you by care management regarding following up with heart failure doctors.

## 2016-01-31 NOTE — ED Notes (Signed)
Pt returned from X-ray.  

## 2016-01-31 NOTE — ED Triage Notes (Signed)
Pt reports sob x2-3 days, pt has been taking lasix and still having fluid buildup in lower extremities, hands, and stomach.  PT not in distress at this time.

## 2016-01-31 NOTE — ED Provider Notes (Signed)
[facility]-EMERGENCY DEPT Provider Note   CSN: [id number] Arrival date & time: 01/31/16  4098  First Provider Contact:  None       History   Chief Complaint Chief Complaint  Patient presents with  . Shortness of Breath  . Edema    HPI [name] is a 50 y.o. male.  The history is provided by the patient and medical records.  Shortness of Breath  This is a recurrent problem. The average episode lasts 5 days. The current episode started more than 2 days ago. The problem has been gradually worsening. Associated symptoms include orthopnea and leg swelling. Pertinent negatives include no fever, no ear pain, no neck pain, no wheezing, no PND, no chest pain and no vomiting. He has had prior hospitalizations.    Past Medical History:  Diagnosis Date  . Allergic rhinitis   . Asthma   . Atrial fibrillation (HCC)   . Essential hypertension   . Gastroesophageal reflux disease   . History of cardiac catheterization    Normal coronaries December 2016  . Noncompliance    Major problem leading to declining health and recurrent hospitalization  . Nonischemic cardiomyopathy (HCC)    LVEF 20-25%  . OSA (obstructive sleep apnea)   . Osteoarthritis   . Peptic ulcer disease     Patient Active Problem List   Diagnosis Date Noted  . Acute on chronic congestive heart failure (HCC)   . Coronary arteries, normal Dec 2016 01/23/2016  . Chronic anticoagulation-Xarelto 01/23/2016  . Chronic renal insufficiency, stage III (moderate) 01/23/2016  . NSVT (nonsustained ventricular tachycardia) (HCC) 01/23/2016  . CHF (congestive heart failure) (HCC) 01/20/2016  . CHF exacerbation (HCC) 01/20/2016  . Acute systolic CHF (congestive heart failure) (HCC) 12/21/2015  . Acute on chronic combined systolic and diastolic CHF (congestive heart failure) (HCC) 12/21/2015  . Elevated troponin 12/21/2015  . Congestive heart failure (CHF) (HCC) 12/03/2015  . Acute on chronic combined systolic (congestive) and  diastolic (congestive) heart failure (HCC)   . Nonischemic cardiomyopathy (HCC)   . Morbid obesity due to excess calories (HCC)   . Noncompliance with diet and medication regimen 12/02/2015  . Acute on chronic systolic congestive heart failure (HCC)   . Acute diastolic CHF (congestive heart failure) (HCC) 11/18/2015  . Acute renal failure superimposed on stage 3 chronic kidney disease (HCC) 10/28/2015  . Acute on chronic combined systolic and diastolic heart failure (HCC) 10/08/2015  . OSA (obstructive sleep apnea) 09/20/2015  . Pulmonary hypertension (HCC) 09/19/2015  . Leukopenia 09/19/2015  . Normocytic anemia 09/19/2015  . Chronic atrial fibrillation (HCC)   . Dyspnea on exertion 05/28/2015  . Acute respiratory failure with hypoxia (HCC) 04/01/2015  . Pneumonia 04/01/2015  . Hypokalemia 04/01/2015  . Obesity hypoventilation syndrome (HCC) 08/08/2014  . GERD (gastroesophageal reflux disease) 08/08/2014  . Noncompliance with medication regimen 06/28/2014  . Atrial fibrillation with rapid ventricular response (HCC) 06/28/2014  . Edema of right lower extremity 06/21/2014  . Fecal urgency 06/21/2014  . Asthma 02/11/2009  . Hyperlipidemia 07/12/2008  . OBESITY, MORBID 07/12/2008  . Anxiety state 07/12/2008  . DEPRESSION 07/12/2008  . Essential hypertension 07/12/2008  . ALLERGIC RHINITIS 07/12/2008  . GERD 07/12/2008  . Peptic ulcer 07/12/2008  . OSTEOARTHRITIS 07/12/2008    Past Surgical History:  Procedure Laterality Date  . CARDIAC CATHETERIZATION N/A 06/14/2015   Procedure: Right/Left Heart Cath and Coronary Angiography;  Surgeon: Runell Gess, MD;  Location: [facility] INVASIVE CV LAB;  Service: Cardiovascular;  Laterality: N/A;  Home Medications    Prior to Admission medications   Medication Sig Start Date End Date Taking? Authorizing Provider  atorvastatin (LIPITOR) 80 MG tablet Take 80 mg by mouth daily.    Historical Provider, MD  beclomethasone (QVAR) 80  MCG/ACT inhaler Inhale 2 puffs into the lungs daily as needed (shortness of breath). 11/21/15   Erick Blinks, MD  furosemide (LASIX) 80 MG tablet Take 1 tablet (80 mg total) by mouth 2 (two) times daily. 12/02/15   Jodelle Gross, NP  lisinopril (PRINIVIL,ZESTRIL) 5 MG tablet Take 1 tablet (5 mg total) by mouth daily. 01/17/16   Glynn Octave, MD  metoprolol succinate (TOPROL-XL) 100 MG 24 hr tablet Take 1 tablet (100 mg total) by mouth 2 (two) times daily. Take with or immediately following a meal. 01/17/16   Glynn Octave, MD  potassium chloride SA (K-DUR,KLOR-CON) 20 MEQ tablet Take 2 tablets 2 times daily for 7 days and then 1 tablet 2 times daily thereafter. Patient taking differently: Take 20 mEq by mouth 2 (two) times daily.  01/09/16   Elliot Cousin, MD  rivaroxaban (XARELTO) 20 MG TABS tablet Take 1 tablet (20 mg total) by mouth every evening. Patient taking differently: Take 20 mg by mouth every morning.  11/21/15   Erick Blinks, MD    Family History Family History  Problem Relation Age of Onset  . Stroke Father   . Heart attack Father   . Aneurysm Mother     Cerebral aneurysm  . Hypertension Sister   . Colon cancer Neg Hx   . Inflammatory bowel disease Neg Hx   . Liver disease Neg Hx     Social History Social History  Substance Use Topics  . Smoking status: Former Smoker    Packs/day: 0.50    Years: 20.00    Types: Cigarettes    Start date: 04/26/1988    Quit date: 06/23/2007  . Smokeless tobacco: Never Used     Comment: 1 ppd former smoker  . Alcohol use No     Comment: No etoh since 2009     Allergies   Bee venom; Lipitor [atorvastatin]; Aspirin; Banana; Diltiazem; and Hydralazine   Review of Systems Review of Systems  Constitutional: Negative for fever.  HENT: Negative for ear pain.   Respiratory: Positive for shortness of breath. Negative for wheezing.   Cardiovascular: Positive for orthopnea and leg swelling. Negative for chest pain and PND.    Gastrointestinal: Negative for vomiting.  Musculoskeletal: Negative for neck pain.     Physical Exam Updated Vital Signs BP (!) 142/119   Pulse (!) 54   Temp 98.4 F (36.9 C) (Oral)   Resp 11   SpO2 100%   Physical Exam  Constitutional: He appears well-developed and well-nourished.  HENT:  Head: Normocephalic and atraumatic.  Eyes: Conjunctivae are normal.  Neck: Neck supple.  Cardiovascular: Normal rate and regular rhythm.   No murmur heard. Pulmonary/Chest: Effort normal. No respiratory distress. He has wheezes. He has rales.  Abdominal: Soft. There is no tenderness.  Musculoskeletal: He exhibits no edema.  Neurological: He is alert.  Skin: Skin is warm and dry.  Psychiatric: He has a normal mood and affect.  Nursing note and vitals reviewed.    ED Treatments / Results  Labs (all labs ordered are listed, but only abnormal results are displayed) Labs Reviewed - No data to display  EKG  EKG Interpretation  Date/Time:  Friday January 31 2016 19:26:45 EDT Ventricular Rate:  109 PR Interval:  QRS Duration: 90 QT Interval:  352 QTC Calculation: 474 R Axis:   60 Text Interpretation:  Atrial fibrillation with rapid ventricular response with premature ventricular or aberrantly conducted complexes Nonspecific T wave abnormality Abnormal ECG Confirmed by [facility] MD, Barbara Cower [phone]) on 01/31/2016 7:33:32 PM       Radiology Dg Chest 2 View  Result Date: 01/31/2016 CLINICAL DATA:  Shortness of breath, swelling in bilateral legs and feet for the past few days, but worse today. Hx irregular heartbeat and CHF EXAM: CHEST  2 VIEW COMPARISON:  01/31/2016 FINDINGS: The cardiac silhouette is mildly enlarged. Normal mediastinal and hilar contours. Clear lungs.  No pleural effusion or pneumothorax. Bony thorax is intact. IMPRESSION: 1. No acute cardiopulmonary disease.  Stable mild cardiomegaly. Electronically Signed   By: Amie Portland M.D.   On: 01/31/2016 20:29   Dg Chest 2  View  Result Date: 01/31/2016 CLINICAL DATA:  Shortness of breath for 2-3 days EXAM: CHEST  2 VIEW COMPARISON:  01/20/2016 FINDINGS: Cardiac shadow is enlarged. The lungs are well aerated bilaterally. No focal infiltrate or sizable effusion is seen. No bony abnormality is noted. IMPRESSION: No active cardiopulmonary disease. Electronically Signed   By: Alcide Clever M.D.   On: 01/31/2016 13:04    Procedures Procedures (including critical care time)  Medications Ordered in ED Medications  furosemide (LASIX) injection 80 mg (80 mg Intravenous Given 01/31/16 1944)     Initial Impression / Assessment and Plan / ED Course  I have reviewed the triage vital signs and the nursing notes.  Pertinent labs & imaging results that were available during my care of the patient were reviewed by me and considered in my medical decision making (see chart for details).  Clinical Course    50 year old male with a history of CHF exacerbation here with her who edema. He is just seen in the emergency department in [place] 3 hours prior to my evaluation where the troponin and BNP and vital signs and chest x-ray were all consistent with his baseline. My evaluation patient has slight crackles in his lungs and slightly decreased breath sounds and peripheral edema. No evidence respiratory distress. His blood pressure was initially high but resolved without intervention it is because he did not take his medicines this evening as he was in the urgency department. I will have any evidence of worsening pulmonary edema or indication for repeat of any of his labs or hospitalization. 80 of IV Lasix was given he will double his Lasix at home. Our care management team has gotten involved and will attempt to get a follow-up with a cardiologist.  Final Clinical Impressions(s) / ED Diagnoses   Final diagnoses:  Peripheral edema    New Prescriptions New Prescriptions   No medications on file     Marily Memos, MD 01/31/16  2111

## 2016-02-02 ENCOUNTER — Emergency Department (HOSPITAL_COMMUNITY)
Admission: EM | Admit: 2016-02-02 | Discharge: 2016-02-02 | Disposition: A | Discharge status: Home / Self Care | Payer: Medicare Other | Attending: Emergency Medicine | Admitting: Emergency Medicine

## 2016-02-02 ENCOUNTER — Encounter (HOSPITAL_COMMUNITY): Payer: Self-pay

## 2016-02-02 ENCOUNTER — Emergency Department (HOSPITAL_COMMUNITY): Payer: Medicare Other

## 2016-02-02 DIAGNOSIS — Z87891 Personal history of nicotine dependence: Secondary | ICD-10-CM | POA: Insufficient documentation

## 2016-02-02 DIAGNOSIS — Z79899 Other long term (current) drug therapy: Secondary | ICD-10-CM | POA: Insufficient documentation

## 2016-02-02 DIAGNOSIS — Z9119 Patient's noncompliance with other medical treatment and regimen: Secondary | ICD-10-CM

## 2016-02-02 DIAGNOSIS — I5040 Unspecified combined systolic (congestive) and diastolic (congestive) heart failure: Secondary | ICD-10-CM | POA: Insufficient documentation

## 2016-02-02 DIAGNOSIS — R0602 Shortness of breath: Secondary | ICD-10-CM | POA: Diagnosis not present

## 2016-02-02 DIAGNOSIS — Z91199 Patient's noncompliance with other medical treatment and regimen due to unspecified reason: Secondary | ICD-10-CM

## 2016-02-02 DIAGNOSIS — I4891 Unspecified atrial fibrillation: Secondary | ICD-10-CM | POA: Insufficient documentation

## 2016-02-02 DIAGNOSIS — I130 Hypertensive heart and chronic kidney disease with heart failure and stage 1 through stage 4 chronic kidney disease, or unspecified chronic kidney disease: Secondary | ICD-10-CM | POA: Insufficient documentation

## 2016-02-02 DIAGNOSIS — J45909 Unspecified asthma, uncomplicated: Secondary | ICD-10-CM | POA: Insufficient documentation

## 2016-02-02 DIAGNOSIS — N183 Chronic kidney disease, stage 3 (moderate): Secondary | ICD-10-CM | POA: Diagnosis not present

## 2016-02-02 LAB — BASIC METABOLIC PANEL
ANION GAP: 6 (ref 5–15)
BUN: 21 mg/dL — ABNORMAL HIGH (ref 6–20)
CALCIUM: 8.2 mg/dL — AB (ref 8.9–10.3)
CHLORIDE: 105 mmol/L (ref 101–111)
CO2: 27 mmol/L (ref 22–32)
CREATININE: 0.97 mg/dL (ref 0.61–1.24)
GFR calc non Af Amer: >60 mL/min (ref >60)
Glucose, Bld: 97 mg/dL (ref 65–99)
Potassium: 3 mmol/L — ABNORMAL LOW (ref 3.5–5.1)
Sodium: 138 mmol/L (ref 135–145)

## 2016-02-02 LAB — CBC
HEMATOCRIT: 32.9 % — AB (ref 39.0–52.0)
Hemoglobin: 10.5 g/dL — ABNORMAL LOW (ref 13.0–17.0)
MCH: 26.7 pg (ref 26.0–34.0)
MCHC: 31.9 g/dL (ref 30.0–36.0)
MCV: 83.7 fL (ref 78.0–100.0)
PLATELETS: 236 10*3/uL (ref 150–400)
RBC: 3.93 MIL/uL — ABNORMAL LOW (ref 4.22–5.81)
RDW: 15.6 % — AB (ref 11.5–15.5)
WBC: 3.6 10*3/uL — AB (ref 4.0–10.5)

## 2016-02-02 LAB — BRAIN NATRIURETIC PEPTIDE: B Natriuretic Peptide: 621 pg/mL — ABNORMAL HIGH (ref 0.0–100.0)

## 2016-02-02 LAB — TROPONIN I: Troponin I: 0.03 ng/mL (ref <0.03)

## 2016-02-02 MED ORDER — POTASSIUM CHLORIDE CRYS ER 20 MEQ PO TBCR
40.0000 meq | EXTENDED_RELEASE_TABLET | Freq: Once | ORAL | Status: AC
  Administered 2016-02-02: 40 meq via ORAL
  Filled 2016-02-02: qty 2

## 2016-02-02 MED ORDER — METOPROLOL TARTRATE 50 MG PO TABS
100.0000 mg | ORAL_TABLET | Freq: Once | ORAL | Status: AC
  Administered 2016-02-02: 100 mg via ORAL
  Filled 2016-02-02: qty 2

## 2016-02-02 MED ORDER — FUROSEMIDE 40 MG PO TABS
80.0000 mg | ORAL_TABLET | Freq: Once | ORAL | Status: AC
  Administered 2016-02-02: 80 mg via ORAL
  Filled 2016-02-02: qty 2

## 2016-02-02 MED ORDER — ALBUTEROL SULFATE (2.5 MG/3ML) 0.083% IN NEBU: 5.0000 mg | INHALATION_SOLUTION | Freq: Once | RESPIRATORY_TRACT | Status: DC

## 2016-02-02 MED ORDER — ATORVASTATIN CALCIUM 80 MG PO TABS: 80.0000 mg | ORAL_TABLET | Freq: Once | ORAL | Status: DC

## 2016-02-02 MED ORDER — RIVAROXABAN 20 MG PO TABS
20.0000 mg | ORAL_TABLET | Freq: Once | ORAL | Status: AC
  Administered 2016-02-02: 20 mg via ORAL
  Filled 2016-02-02: qty 1

## 2016-02-02 MED ORDER — LISINOPRIL 5 MG PO TABS
5.0000 mg | ORAL_TABLET | Freq: Once | ORAL | Status: AC
  Administered 2016-02-02: 5 mg via ORAL
  Filled 2016-02-02: qty 1

## 2016-02-02 NOTE — Discharge Instructions (Signed)
Take your usual prescriptions as previously directed.  Call your regular medical doctor tomorrow to schedule a follow up appointment within the next 1 to 2 days.  Return to the Emergency Department immediately sooner if worsening.  ° °

## 2016-02-02 NOTE — ED Provider Notes (Signed)
[facility]-EMERGENCY DEPT Provider Note   CSN: [id number] Arrival date & time: 02/02/16  1191  First Provider Contact:  None       History   Chief Complaint Chief Complaint  Patient presents with  . Shortness of Breath    HPI [name] is a 50 y.o. male.  HPI  Pt was seen at 0855. Per EMS and pt report, c/o gradual onset and persistence of constant acute flair of his chronic SOB since yesterday evening. Has been associated with pedal edema. Pt states he "told EMS he wanted to go to [facility] but they brought him to this ED. Pt has not taken any of his meds today. Pt has known hx of medication non-compliance. The symptoms have been associated with no other complaints. The patient has a significant history of similar symptoms previously, recently being evaluated for this complaint and multiple prior evals for same, including in the ED 2 times 2 days ago and left AMA from his last hospital admission 1 week ago.    Past Medical History:  Diagnosis Date  . Allergic rhinitis   . Asthma   . Atrial fibrillation (HCC)   . Essential hypertension   . Gastroesophageal reflux disease   . History of cardiac catheterization    Normal coronaries December 2016  . Noncompliance    Major problem leading to declining health and recurrent hospitalization  . Nonischemic cardiomyopathy (HCC)    LVEF 20-25%  . OSA (obstructive sleep apnea)   . Osteoarthritis   . Peptic ulcer disease     Patient Active Problem List   Diagnosis Date Noted  . Acute on chronic congestive heart failure (HCC)   . Coronary arteries, normal Dec 2016 01/23/2016  . Chronic anticoagulation-Xarelto 01/23/2016  . Chronic renal insufficiency, stage III (moderate) 01/23/2016  . NSVT (nonsustained ventricular tachycardia) (HCC) 01/23/2016  . CHF (congestive heart failure) (HCC) 01/20/2016  . CHF exacerbation (HCC) 01/20/2016  . Acute systolic CHF (congestive heart failure) (HCC) 12/21/2015  . Acute on chronic combined  systolic and diastolic CHF (congestive heart failure) (HCC) 12/21/2015  . Elevated troponin 12/21/2015  . Congestive heart failure (CHF) (HCC) 12/03/2015  . Acute on chronic combined systolic (congestive) and diastolic (congestive) heart failure (HCC)   . Nonischemic cardiomyopathy (HCC)   . Morbid obesity due to excess calories (HCC)   . Noncompliance with diet and medication regimen 12/02/2015  . Acute on chronic systolic congestive heart failure (HCC)   . Acute diastolic CHF (congestive heart failure) (HCC) 11/18/2015  . Acute renal failure superimposed on stage 3 chronic kidney disease (HCC) 10/28/2015  . Acute on chronic combined systolic and diastolic heart failure (HCC) 10/08/2015  . OSA (obstructive sleep apnea) 09/20/2015  . Pulmonary hypertension (HCC) 09/19/2015  . Leukopenia 09/19/2015  . Normocytic anemia 09/19/2015  . Chronic atrial fibrillation (HCC)   . Dyspnea on exertion 05/28/2015  . Acute respiratory failure with hypoxia (HCC) 04/01/2015  . Pneumonia 04/01/2015  . Hypokalemia 04/01/2015  . Obesity hypoventilation syndrome (HCC) 08/08/2014  . GERD (gastroesophageal reflux disease) 08/08/2014  . Noncompliance with medication regimen 06/28/2014  . Atrial fibrillation with rapid ventricular response (HCC) 06/28/2014  . Edema of right lower extremity 06/21/2014  . Fecal urgency 06/21/2014  . Asthma 02/11/2009  . Hyperlipidemia 07/12/2008  . OBESITY, MORBID 07/12/2008  . Anxiety state 07/12/2008  . DEPRESSION 07/12/2008  . Essential hypertension 07/12/2008  . ALLERGIC RHINITIS 07/12/2008  . GERD 07/12/2008  . Peptic ulcer 07/12/2008  . OSTEOARTHRITIS 07/12/2008  Past Surgical History:  Procedure Laterality Date  . CARDIAC CATHETERIZATION N/A 06/14/2015   Procedure: Right/Left Heart Cath and Coronary Angiography;  Surgeon: Runell Gess, MD;  Location: [facility] INVASIVE CV LAB;  Service: Cardiovascular;  Laterality: N/A;       Home Medications    Prior to  Admission medications   Medication Sig Start Date End Date Taking? Authorizing Provider  atorvastatin (LIPITOR) 80 MG tablet Take 80 mg by mouth daily.    Historical Provider, MD  beclomethasone (QVAR) 80 MCG/ACT inhaler Inhale 2 puffs into the lungs daily as needed (shortness of breath). 11/21/15   Erick Blinks, MD  furosemide (LASIX) 80 MG tablet Take 1 tablet (80 mg total) by mouth 2 (two) times daily. 12/02/15   Jodelle Gross, NP  lisinopril (PRINIVIL,ZESTRIL) 5 MG tablet Take 1 tablet (5 mg total) by mouth daily. 01/17/16   Glynn Octave, MD  metoprolol succinate (TOPROL-XL) 100 MG 24 hr tablet Take 1 tablet (100 mg total) by mouth 2 (two) times daily. Take with or immediately following a meal. 01/17/16   Glynn Octave, MD  potassium chloride SA (K-DUR,KLOR-CON) 20 MEQ tablet Take 2 tablets 2 times daily for 7 days and then 1 tablet 2 times daily thereafter. Patient taking differently: Take 20 mEq by mouth 2 (two) times daily.  01/09/16   Elliot Cousin, MD  rivaroxaban (XARELTO) 20 MG TABS tablet Take 1 tablet (20 mg total) by mouth every evening. Patient taking differently: Take 20 mg by mouth every morning.  11/21/15   Erick Blinks, MD    Family History Family History  Problem Relation Age of Onset  . Stroke Father   . Heart attack Father   . Aneurysm Mother     Cerebral aneurysm  . Hypertension Sister   . Colon cancer Neg Hx   . Inflammatory bowel disease Neg Hx   . Liver disease Neg Hx     Social History Social History  Substance Use Topics  . Smoking status: Former Smoker    Packs/day: 0.50    Years: 20.00    Types: Cigarettes    Start date: 04/26/1988    Quit date: 06/23/2007  . Smokeless tobacco: Never Used     Comment: 1 ppd former smoker  . Alcohol use No     Comment: No etoh since 2009     Allergies   Bee venom; Lipitor [atorvastatin]; Aspirin; Banana; Diltiazem; and Hydralazine   Review of Systems Review of Systems ROS: Statement: All systems negative  except as marked or noted in the HPI; Constitutional: Negative for fever and chills. ; ; Eyes: Negative for eye pain, redness and discharge. ; ; ENMT: Negative for ear pain, hoarseness, nasal congestion, sinus pressure and sore throat. ; ; Cardiovascular: Negative for chest pain, palpitations, diaphoresis, +dyspnea and peripheral edema. ; ; Respiratory: Negative for cough, wheezing and stridor. ; ; Gastrointestinal: Negative for nausea, vomiting, diarrhea, abdominal pain, blood in stool, hematemesis, jaundice and rectal bleeding. . ; ; Genitourinary: Negative for dysuria, flank pain and hematuria. ; ; Musculoskeletal: Negative for back pain and neck pain. Negative for swelling and trauma.; ; Skin: Negative for pruritus, rash, abrasions, blisters, bruising and skin lesion.; ; Neuro: Negative for headache, lightheadedness and neck stiffness. Negative for weakness, altered level of consciousness, altered mental status, extremity weakness, paresthesias, involuntary movement, seizure and syncope.       Physical Exam Updated Vital Signs BP 116/90 (BP Location: Right Arm) Comment: Simultaneous filing. User may not have seen  previous data. Comment (BP Location): Simultaneous filing. User may not have seen previous data.  Pulse 87 Comment: Simultaneous filing. User may not have seen previous data.  Temp 98.6 F (37 C) (Oral) Comment: Simultaneous filing. User may not have seen previous data. Comment (Src): Simultaneous filing. User may not have seen previous data.  Resp 16 Comment: Simultaneous filing. User may not have seen previous data.  Ht 6' (1.829 m)   Wt 299 lb (135.6 kg)   SpO2 100% Comment: Simultaneous filing. User may not have seen previous data.  BMI 40.55 kg/m     Patient Vitals for the past 24 hrs:  BP Temp Temp src Pulse Resp SpO2 Height Weight  02/02/16 1200 113/92 - - 72 20 96 % - -  02/02/16 1100 (!) 140/106 - - 97 19 97 % - -  02/02/16 1000 (!) 156/108 - - 114 24 98 % - -  02/02/16  0959 (!) 147/104 - - - - - - -  02/02/16 0930 (!) 147/104 - - 84 - 96 % - -  02/02/16 0900 (!) 134/118 - - (!) 41 19 97 % - -  02/02/16 0840 - - - - - - 6' (1.829 m) 299 lb (135.6 kg)  02/02/16 0839 116/90 98.6 F (37 C) Oral 87 16 100 % - -      Physical Exam 0900: Physical examination:  Nursing notes reviewed; Vital signs and O2 SAT reviewed;  Constitutional: Well developed, Well nourished, Well hydrated, In no acute distress; Head:  Normocephalic, atraumatic; Eyes: EOMI, PERRL, No scleral icterus; ENMT: Mouth and pharynx normal, Mucous membranes moist; Neck: Supple, Full range of motion, No lymphadenopathy; Cardiovascular: Irregular rate and rhythm, No gallop; Respiratory: Breath sounds coarse & equal bilaterally, No wheezes.  Speaking full sentences with ease, Normal respiratory effort/excursion; Chest: Nontender, Movement normal; Abdomen: Soft, Nontender, Nondistended, Normal bowel sounds; Genitourinary: No CVA tenderness; Extremities: Pulses normal, No tenderness, +2-3 pedal edema bilat. No asymmetry.; Neuro: AA&Ox3, Major CN grossly intact.  Speech clear. No gross focal motor or sensory deficits in extremities.; Skin: Color normal, Warm, Dry.; Psych:  Affect flat, poor eye contact.    ED Treatments / Results  Labs (all labs ordered are listed, but only abnormal results are displayed)   EKG  EKG Interpretation  Date/Time:  Sunday February 02 2016 08:38:57 EDT Ventricular Rate:  95 PR Interval:    QRS Duration: 98 QT Interval:  388 QTC Calculation: 488 R Axis:   72 Text Interpretation:  Atrial fibrillation Premature ventricular complexes Borderline T wave abnormalities Borderline prolonged QT interval Baseline wander When compared with ECG of 01/31/2016 No significant change was found Confirmed by [facility]  MD, Nicholos Johns [phone]) on 02/02/2016 9:31:50 AM       Radiology   Procedures Procedures (including critical care time)  Medications Ordered in ED Medications    atorvastatin (LIPITOR) tablet 80 mg (not administered)  furosemide (LASIX) tablet 80 mg (not administered)  lisinopril (PRINIVIL,ZESTRIL) tablet 5 mg (not administered)  metoprolol (LOPRESSOR) tablet 100 mg (not administered)  rivaroxaban (XARELTO) tablet 20 mg (not administered)     Initial Impression / Assessment and Plan / ED Course  I have reviewed the triage vital signs and the nursing notes.  Pertinent labs & imaging results that were available during my care of the patient were reviewed by me and considered in my medical decision making (see chart for details).  MDM Reviewed: nursing note, vitals and previous chart Reviewed previous: labs and ECG Interpretation: labs,  ECG and x-ray   Results for orders placed or performed during the hospital encounter of 02/02/16  Brain natriuretic peptide  Result Value Ref Range   B Natriuretic Peptide 621.0 (H) 0.0 - 100.0 pg/mL  CBC  Result Value Ref Range   WBC 3.6 (L) 4.0 - 10.5 K/uL   RBC 3.93 (L) 4.22 - 5.81 MIL/uL   Hemoglobin 10.5 (L) 13.0 - 17.0 g/dL   HCT 22.5 (L) 75.0 - 51.8 %   MCV 83.7 78.0 - 100.0 fL   MCH 26.7 26.0 - 34.0 pg   MCHC 31.9 30.0 - 36.0 g/dL   RDW 33.5 (H) 82.5 - 18.9 %   Platelets 236 150 - 400 K/uL  Troponin I  Result Value Ref Range   Troponin I 0.03 (HH) <0.03 ng/mL  Basic metabolic panel  Result Value Ref Range   Sodium 138 135 - 145 mmol/L   Potassium 3.0 (L) 3.5 - 5.1 mmol/L   Chloride 105 101 - 111 mmol/L   CO2 27 22 - 32 mmol/L   Glucose, Bld 97 65 - 99 mg/dL   BUN 21 (H) 6 - 20 mg/dL   Creatinine, Ser 8.42 0.61 - 1.24 mg/dL   Calcium 8.2 (L) 8.9 - 10.3 mg/dL   GFR calc non Af Amer >60 >60 mL/min   GFR calc Af Amer >60 >60 mL/min   Anion gap 6 5 - 15   Dg Chest 2 View Result Date: 02/02/2016 CLINICAL DATA:  Shortness of breath since yesterday evening, history hypertension, acute on chronic CHF, non ischemic cardiomyopathy, atrial fibrillation, asthma, former smoker EXAM: CHEST  2 VIEW  COMPARISON:  01/31/2016 FINDINGS: Enlargement of cardiac silhouette with pulmonary vascular congestion. Mediastinal contours normal. No definite acute infiltrate, pleural effusion or pneumothorax. Bones demineralized. IMPRESSION: Enlargement of cardiac silhouette with pulmonary vascular congestion. No acute infiltrate. Electronically Signed   By: Ulyses Southward M.D.   On: 02/02/2016 09:33    1230:  Pt has denied SOB since arrival to the ED. Labs at baseline. Potassium repleted PO. Pt given his usual home meds while in the ED. Pt requesting to "go to [facility]." Aware there is no indication to transfer him to [place] at this time. No indication for admission to [facility] at this time. Ready to go home now. Dx and testing d/w pt.  Questions answered.  Verb understanding, agreeable to d/c home with outpt f/u.        Final Clinical Impressions(s) / ED Diagnoses   Final diagnoses:  None    New Prescriptions New Prescriptions   No medications on file     Samuel Jester, DO 02/04/16 1712

## 2016-02-02 NOTE — ED Notes (Signed)
Pt is sitting on side of bed in upright position, NAD noted. Flat affect noted, pt denied any needs at this time. Reports SOB has improved.

## 2016-02-02 NOTE — ED Notes (Signed)
CRITICAL VALUE ALERT  Critical value received:  Troponin: 0.03  Date of notification:  02/02/2016  Time of notification:  1032  Critical value read back:Yes.    Nurse who received alert:  M.[facility]  MD Notified: Clarene Duke

## 2016-02-02 NOTE — ED Triage Notes (Signed)
Pt was given NTG x two by EMS prior to arrival

## 2016-02-02 NOTE — ED Triage Notes (Signed)
Pt complain of being SOB since yesterday evening. Pt has not SOB on arrival to ED. Pt  Had requested EMS to take him to [facility] and also request after arival here to be transferred to [facility]. Pt has flat affect and when speaking it is a whisper.

## 2016-02-04 ENCOUNTER — Encounter (HOSPITAL_COMMUNITY): Payer: Self-pay | Admitting: Cardiology

## 2016-02-04 ENCOUNTER — Emergency Department (HOSPITAL_COMMUNITY)
Admission: EM | Admit: 2016-02-04 | Discharge: 2016-02-04 | Disposition: A | Discharge status: Home / Self Care | Payer: Medicare Other | Attending: Emergency Medicine | Admitting: Emergency Medicine

## 2016-02-04 ENCOUNTER — Emergency Department (HOSPITAL_COMMUNITY): Payer: Medicare Other

## 2016-02-04 DIAGNOSIS — I110 Hypertensive heart disease with heart failure: Secondary | ICD-10-CM | POA: Insufficient documentation

## 2016-02-04 DIAGNOSIS — Z87891 Personal history of nicotine dependence: Secondary | ICD-10-CM | POA: Diagnosis not present

## 2016-02-04 DIAGNOSIS — J45909 Unspecified asthma, uncomplicated: Secondary | ICD-10-CM | POA: Diagnosis not present

## 2016-02-04 DIAGNOSIS — I5043 Acute on chronic combined systolic (congestive) and diastolic (congestive) heart failure: Secondary | ICD-10-CM | POA: Insufficient documentation

## 2016-02-04 DIAGNOSIS — J449 Chronic obstructive pulmonary disease, unspecified: Secondary | ICD-10-CM | POA: Diagnosis not present

## 2016-02-04 DIAGNOSIS — R0602 Shortness of breath: Secondary | ICD-10-CM | POA: Diagnosis not present

## 2016-02-04 DIAGNOSIS — Z79899 Other long term (current) drug therapy: Secondary | ICD-10-CM | POA: Diagnosis not present

## 2016-02-04 LAB — CBC WITH DIFFERENTIAL/PLATELET
BASOS ABS: 0 10*3/uL (ref 0.0–0.1)
BASOS PCT: 1 %
EOS ABS: 0.1 10*3/uL (ref 0.0–0.7)
EOS PCT: 2 %
HCT: 33.9 % — ABNORMAL LOW (ref 39.0–52.0)
Hemoglobin: 10.8 g/dL — ABNORMAL LOW (ref 13.0–17.0)
LYMPHS PCT: 22 %
Lymphs Abs: 0.9 10*3/uL (ref 0.7–4.0)
MCH: 26.7 pg (ref 26.0–34.0)
MCHC: 31.9 g/dL (ref 30.0–36.0)
MCV: 83.7 fL (ref 78.0–100.0)
MONO ABS: 0.4 10*3/uL (ref 0.1–1.0)
Monocytes Relative: 9 %
Neutro Abs: 2.8 10*3/uL (ref 1.7–7.7)
Neutrophils Relative %: 66 %
PLATELETS: 231 10*3/uL (ref 150–400)
RBC: 4.05 MIL/uL — AB (ref 4.22–5.81)
RDW: 15.6 % — AB (ref 11.5–15.5)
WBC: 4.2 10*3/uL (ref 4.0–10.5)

## 2016-02-04 LAB — COMPREHENSIVE METABOLIC PANEL
ALBUMIN: 3.5 g/dL (ref 3.5–5.0)
ALT: 22 U/L (ref 17–63)
AST: 22 U/L (ref 15–41)
Alkaline Phosphatase: 77 U/L (ref 38–126)
Anion gap: 4 — ABNORMAL LOW (ref 5–15)
BUN: 24 mg/dL — AB (ref 6–20)
CHLORIDE: 109 mmol/L (ref 101–111)
CO2: 24 mmol/L (ref 22–32)
CREATININE: 1.13 mg/dL (ref 0.61–1.24)
Calcium: 8.3 mg/dL — ABNORMAL LOW (ref 8.9–10.3)
GFR calc Af Amer: >60 mL/min (ref >60)
GFR calc non Af Amer: >60 mL/min (ref >60)
Glucose, Bld: 92 mg/dL (ref 65–99)
POTASSIUM: 3.4 mmol/L — AB (ref 3.5–5.1)
SODIUM: 137 mmol/L (ref 135–145)
Total Bilirubin: 1.6 mg/dL — ABNORMAL HIGH (ref 0.3–1.2)
Total Protein: 6.5 g/dL (ref 6.5–8.1)

## 2016-02-04 LAB — TROPONIN I: TROPONIN I: 0.03 ng/mL — AB (ref <0.03)

## 2016-02-04 LAB — BRAIN NATRIURETIC PEPTIDE: B NATRIURETIC PEPTIDE 5: 603 pg/mL — AB (ref 0.0–100.0)

## 2016-02-04 MED ORDER — ALBUTEROL SULFATE HFA 108 (90 BASE) MCG/ACT IN AERS
2.0000 | INHALATION_SPRAY | RESPIRATORY_TRACT | Status: DC | PRN
  Administered 2016-02-04: 2 via RESPIRATORY_TRACT
  Filled 2016-02-04: qty 6.7

## 2016-02-04 NOTE — ED Triage Notes (Addendum)
Lower extremity Edema and sob times 2 days

## 2016-02-04 NOTE — ED Provider Notes (Signed)
[facility]-EMER[facility]ENCY DEPT Provider Note   CSN: [id number] Arrival date & time: 02/04/16  1341     History   Chief Complaint Chief Complaint  Patient presents with  . Shortness of Breath    HPI [name] is a 50 y.o. male.  Patient complains of shortness of breath. He will have a nurse come out to his house to more to help with his care. Patient has congestive heart failure and COPD and has frequent bouts of shortness of breath   The history is provided by the patient. No language interpreter was used.  Shortness of Breath  This is a recurrent problem. The problem occurs frequently.The current episode started more than 2 days ago. The problem has not changed since onset.Pertinent negatives include no headaches, no ear pain, no cough, no chest pain, no abdominal pain and no rash. Precipitated by: Exercise. Risk factors: None. He has tried inhaled steroids for the symptoms. He has had prior hospitalizations. He has had prior ED visits. He has had no prior ICU admissions. Associated medical issues include COPD.    Past Medical History:  Diagnosis Date  . Allergic rhinitis   . Asthma   . Atrial fibrillation (HCC)   . Essential hypertension   . Gastroesophageal reflux disease   . History of cardiac catheterization    Normal coronaries December 2016  . Noncompliance    Major problem leading to declining health and recurrent hospitalization  . Nonischemic cardiomyopathy (HCC)    LVEF 20-25%  . OSA (obstructive sleep apnea)   . Osteoarthritis   . Peptic ulcer disease     Patient Active Problem List   Diagnosis Date Noted  . Acute on chronic congestive heart failure (HCC)   . Coronary arteries, normal Dec 2016 01/23/2016  . Chronic anticoagulation-Xarelto 01/23/2016  . Chronic renal insufficiency, stage III (moderate) 01/23/2016  . NSVT (nonsustained ventricular tachycardia) (HCC) 01/23/2016  . CHF (congestive heart failure) (HCC) 01/20/2016  . CHF exacerbation (HCC)  01/20/2016  . Acute systolic CHF (congestive heart failure) (HCC) 12/21/2015  . Acute on chronic combined systolic and diastolic CHF (congestive heart failure) (HCC) 12/21/2015  . Elevated troponin 12/21/2015  . Congestive heart failure (CHF) (HCC) 12/03/2015  . Acute on chronic combined systolic (congestive) and diastolic (congestive) heart failure (HCC)   . Nonischemic cardiomyopathy (HCC)   . Morbid obesity due to excess calories (HCC)   . Noncompliance with diet and medication regimen 12/02/2015  . Acute on chronic systolic congestive heart failure (HCC)   . Acute diastolic CHF (congestive heart failure) (HCC) 11/18/2015  . Acute renal failure superimposed on stage 3 chronic kidney disease (HCC) 10/28/2015  . Acute on chronic combined systolic and diastolic heart failure (HCC) 10/08/2015  . OSA (obstructive sleep apnea) 09/20/2015  . Pulmonary hypertension (HCC) 09/19/2015  . Leukopenia 09/19/2015  . Normocytic anemia 09/19/2015  . Chronic atrial fibrillation (HCC)   . Dyspnea on exertion 05/28/2015  . Acute respiratory failure with hypoxia (HCC) 04/01/2015  . Pneumonia 04/01/2015  . Hypokalemia 04/01/2015  . Obesity hypoventilation syndrome (HCC) 08/08/2014  . GERD (gastroesophageal reflux disease) 08/08/2014  . Noncompliance with medication regimen 06/28/2014  . Atrial fibrillation with rapid ventricular response (HCC) 06/28/2014  . Edema of right lower extremity 06/21/2014  . Fecal urgency 06/21/2014  . Asthma 02/11/2009  . Hyperlipidemia 07/12/2008  . OBESITY, MORBID 07/12/2008  . Anxiety state 07/12/2008  . DEPRESSION 07/12/2008  . Essential hypertension 07/12/2008  . ALLERGIC RHINITIS 07/12/2008  . GERD 07/12/2008  .  Peptic ulcer 07/12/2008  . OSTEOARTHRITIS 07/12/2008    Past Surgical History:  Procedure Laterality Date  . CARDIAC CATHETERIZATION N/A 06/14/2015   Procedure: Right/Left Heart Cath and Coronary Angiography;  Surgeon: Runell GessJonathan J Berry, MD;  [facility];  Service: Cardiovascular;  Laterality: N/A;       Home Medications    Prior to Admission medications   Medication Sig Start Date End Date Taking? Authorizing Provider  atorvastatin (LIPITOR) 80 MG tablet Take 80 mg by mouth daily.   Yes Historical Provider, MD  beclomethasone (QVAR) 80 MCG/ACT inhaler Inhale 2 puffs into the lungs daily as needed (shortness of breath). 11/21/15  Yes Erick BlinksJehanzeb Memon, MD  furosemide (LASIX) 80 MG tablet Take 1 tablet (80 mg total) by mouth 2 (two) times daily. 12/02/15  Yes Jodelle GrossKathryn M Lawrence, NP  lisinopril (PRINIVIL,ZESTRIL) 5 MG tablet Take 1 tablet (5 mg total) by mouth daily. 01/17/16  Yes Glynn OctaveStephen Rancour, MD  metoprolol succinate (TOPROL-XL) 100 MG 24 hr tablet Take 1 tablet (100 mg total) by mouth 2 (two) times daily. Take with or immediately following a meal. 01/17/16  Yes Glynn OctaveStephen Rancour, MD  potassium chloride SA (K-DUR,KLOR-CON) 20 MEQ tablet Take 2 tablets 2 times daily for 7 days and then 1 tablet 2 times daily thereafter. Patient taking differently: Take 20 mEq by mouth 2 (two) times daily.  01/09/16  Yes Elliot Cousinenise Fisher, MD  rivaroxaban (XARELTO) 20 MG TABS tablet Take 1 tablet (20 mg total) by mouth every evening. Patient taking differently: Take 20 mg by mouth at bedtime.  11/21/15  Yes Erick BlinksJehanzeb Memon, MD    Family History Family History  Problem Relation Age of Onset  . Stroke Father   . Heart attack Father   . Aneurysm Mother     Cerebral aneurysm  . Hypertension Sister   . Colon cancer Neg Hx   . Inflammatory bowel disease Neg Hx   . Liver disease Neg Hx     Social History Social History  Substance Use Topics  . Smoking status: Former Smoker    Packs/day: 0.50    Years: 20.00    Types: Cigarettes    Start date: 04/26/1988    Quit date: 06/23/2007  . Smokeless tobacco: Never Used     Comment: 1 ppd former smoker  . Alcohol use No     Comment: No etoh since 2009     Allergies   Bee venom; Lipitor  [atorvastatin]; Aspirin; Banana; Diltiazem; and Hydralazine   Review of Systems Review of Systems  Constitutional: Negative for appetite change and fatigue.  HENT: Negative for congestion, ear discharge, ear pain and sinus pressure.   Eyes: Negative for discharge.  Respiratory: Positive for shortness of breath. Negative for cough.   Cardiovascular: Negative for chest pain.  Gastrointestinal: Negative for abdominal pain and diarrhea.  Genitourinary: Negative for frequency and hematuria.  Musculoskeletal: Negative for back pain.  Skin: Negative for rash.  Neurological: Negative for seizures and headaches.  Psychiatric/Behavioral: Negative for hallucinations.     Physical Exam Updated Vital Signs BP (!) 157/114   Pulse (!) 51   Temp 98.2 F (36.8 C) (Oral)   Resp 22   Ht 6' (1.829 m)   Wt 300 lb (136.1 kg)   SpO2 97%   BMI 40.69 kg/m   Physical Exam  Constitutional: He is oriented to person, place, and time. He appears well-developed.  HENT:  Head: Normocephalic.  Eyes: Conjunctivae and EOM are normal. No scleral icterus.  Neck: Neck supple. No thyromegaly present.  Cardiovascular: Normal rate and regular rhythm.  Exam reveals no gallop and no friction rub.   No murmur heard. Pulmonary/Chest: No stridor. He has no wheezes. He has no rales. He exhibits no tenderness.  Abdominal: He exhibits no distension. There is no tenderness. There is no rebound.  Musculoskeletal: Normal range of motion. He exhibits no edema.  Lymphadenopathy:    He has no cervical adenopathy.  Neurological: He is oriented to person, place, and time. He exhibits normal muscle tone. Coordination normal.  Skin: No rash noted. No erythema.  Psychiatric: He has a normal mood and affect. His behavior is normal.     ED Treatments / Results  Labs (all labs ordered are listed, but only abnormal results are displayed) Labs Reviewed  CBC WITH DIFFERENTIAL/PLATELET - Abnormal; Notable for the following:        Result Value   RBC 4.05 (*)    Hemoglobin 10.8 (*)    HCT 33.9 (*)    RDW 15.6 (*)    All other components within normal limits  COMPREHENSIVE METABOLIC PANEL - Abnormal; Notable for the following:    Potassium 3.4 (*)    BUN 24 (*)    Calcium 8.3 (*)    Total Bilirubin 1.6 (*)    Anion gap 4 (*)    All other components within normal limits  BRAIN NATRIURETIC PEPTIDE - Abnormal; Notable for the following:    B Natriuretic Peptide 603.0 (*)    All other components within normal limits  TROPONIN I - Abnormal; Notable for the following:    Troponin I 0.03 (*)    All other components within normal limits    EKG  EKG Interpretation None       Radiology Dg Chest 2 View  Result Date: 02/04/2016 CLINICAL DATA:  Short of breath EXAM: CHEST  2 VIEW COMPARISON:  02/02/2016 FINDINGS: Cardiac enlargement. Improvement in vascular congestion and edema. No significant pleural effusion. Mild bibasilar atelectasis unchanged. Negative for pneumonia. No mass lesion. IMPRESSION: Improving congestive heart failure. Mild bibasilar atelectasis also improved. Electronically Signed   By: Marlan Palau M.D.   On: 02/04/2016 14:57    Procedures Procedures (including critical care time)  Medications Ordered in ED Medications  albuterol (PROVENTIL HFA;VENTOLIN HFA) 108 (90 Base) MCG/ACT inhaler 2 puff (not administered)     Initial Impression / Assessment and Plan / ED Course  I have reviewed the triage vital signs and the nursing notes.  Pertinent labs & imaging results that were available during my care of the patient were reviewed by me and considered in my medical decision making (see chart for details).  Clinical Course    Patient with congestive heart failure and COPD. Heart failure seems to be improved on chest x-ray. Patient improved some with albuterol. Patient will be discharged home and will be seen by a nurse in his house tomorrow for follow-up  Final Clinical Impressions(s) /  ED Diagnoses   Final diagnoses:  SOB (shortness of breath)    New Prescriptions New Prescriptions   No medications on file     Bethann Berkshire, MD 02/04/16 1743

## 2016-02-04 NOTE — Discharge Instructions (Signed)
You will see a nurse at your house tomorrow. Follow-up with your family doctor as indicated by the nurse that comes out your house

## 2016-02-18 ENCOUNTER — Telehealth (HOSPITAL_COMMUNITY): Payer: Self-pay | Admitting: Surgery

## 2016-02-18 NOTE — Telephone Encounter (Signed)
[name]- Outpatient HF Clinic SW received call and consulted myself regarding patient and referral to the AHF Clinic from [facility].  Patient is an appropriate referral for the AHF clinic and would certainly benefit from additional resources provided through the clinic.  [facility] Care Manager will approach patient regarding appointment and contact me if needed to schedule appt.

## 2016-02-26 IMAGING — US US EXTREM LOW VENOUS*R*
1 series · 13 of 24 positions shown · non-contrast
Comparison: None.

CLINICAL DATA: Initial encounter for right lower extremity edema,
chronic



[Series 1: us extrem low venous*right* · 0.11mm/px · 13 of 41 slices shown]
[im 1/41]
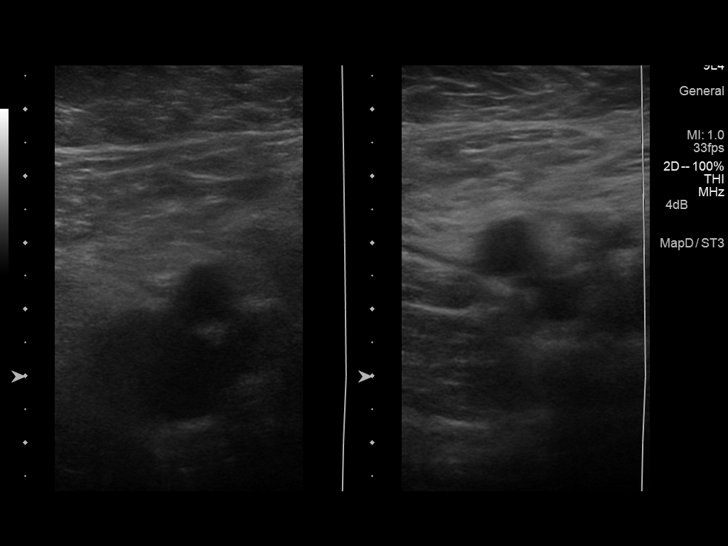
[im 4/41]
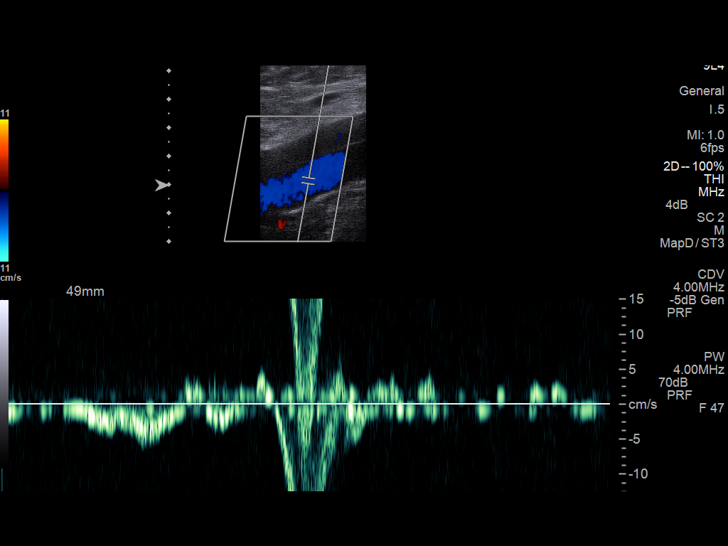
[im 7/41]
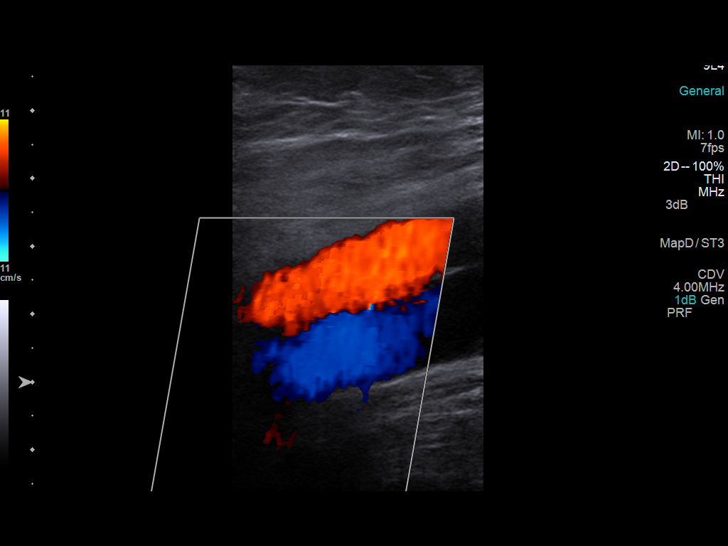
[im 11/41]
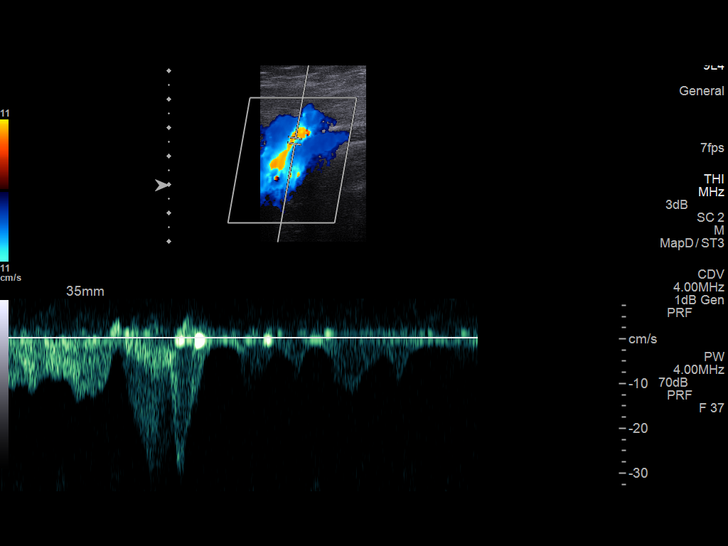
[im 14/41]
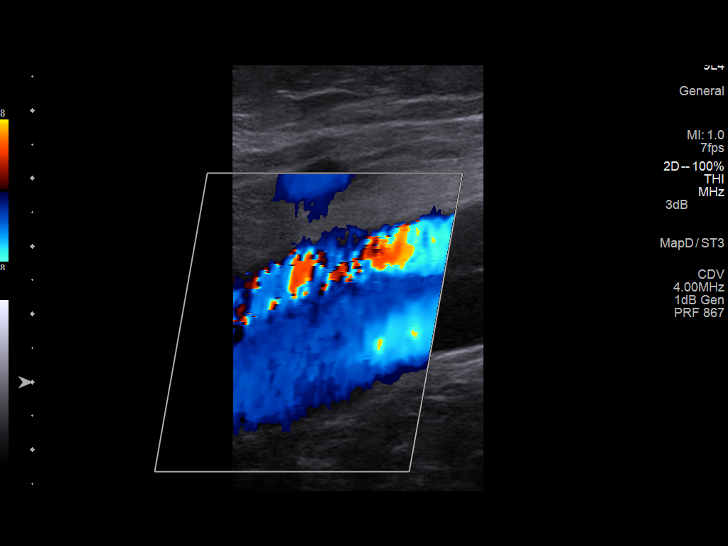
[im 18/41]
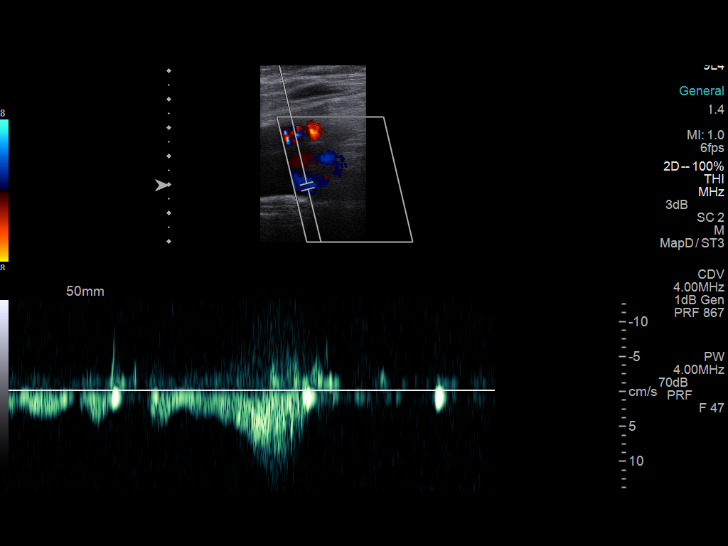
[im 21/41]
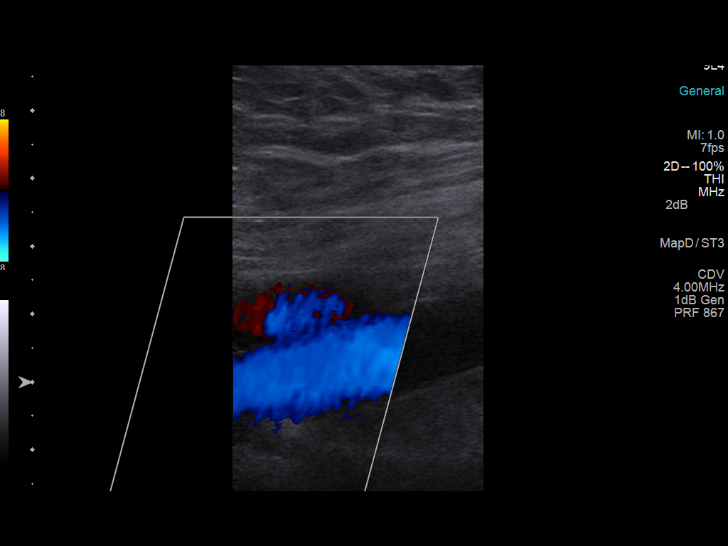
[im 23/41]
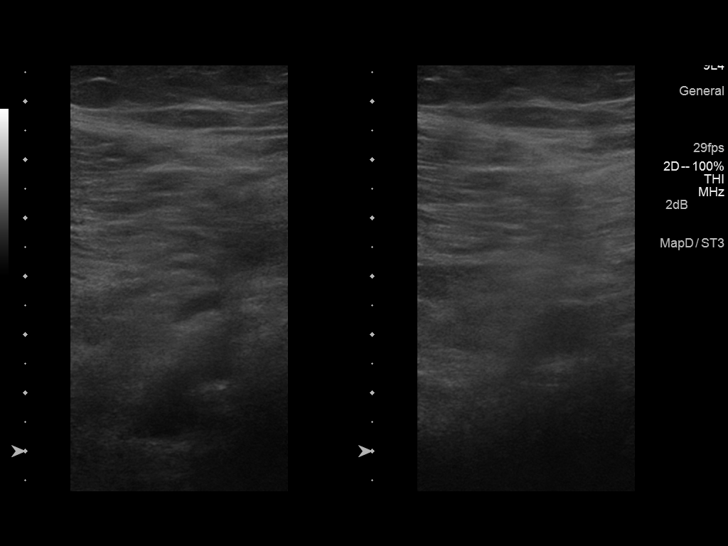
[im 27/41]
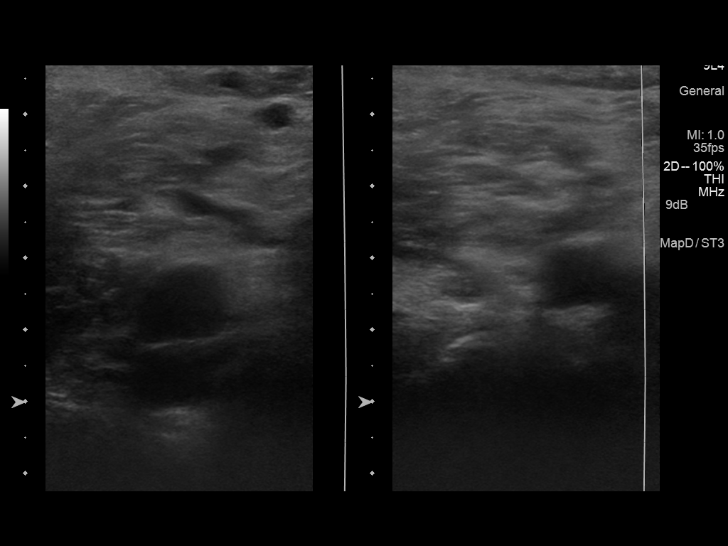
[im 30/41]
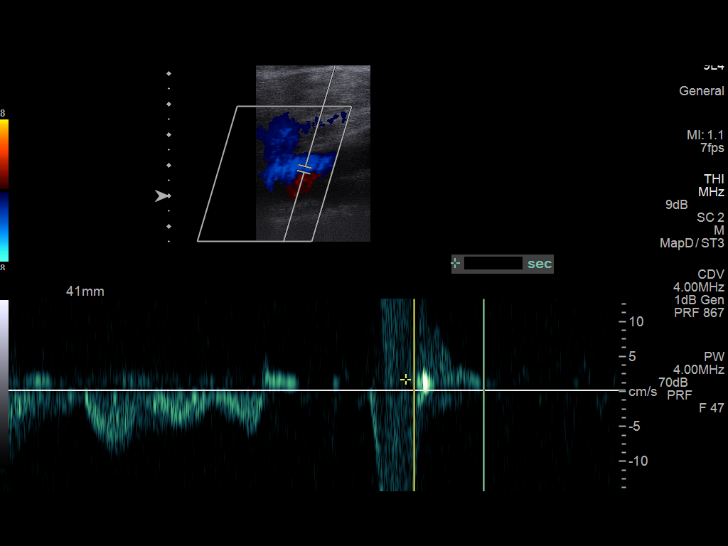
[im 34/41]
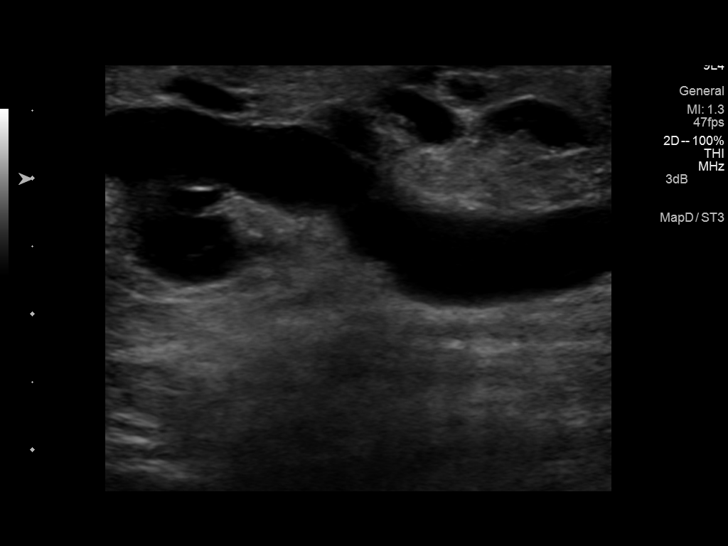
[im 37/41]
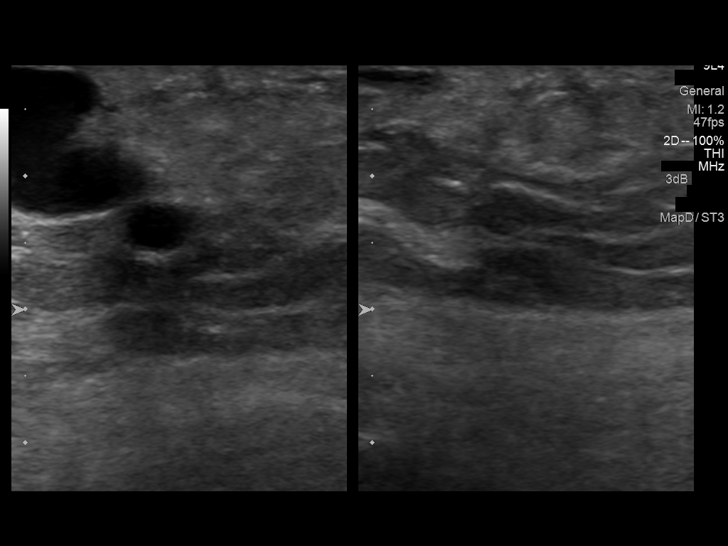
[im 41/41]
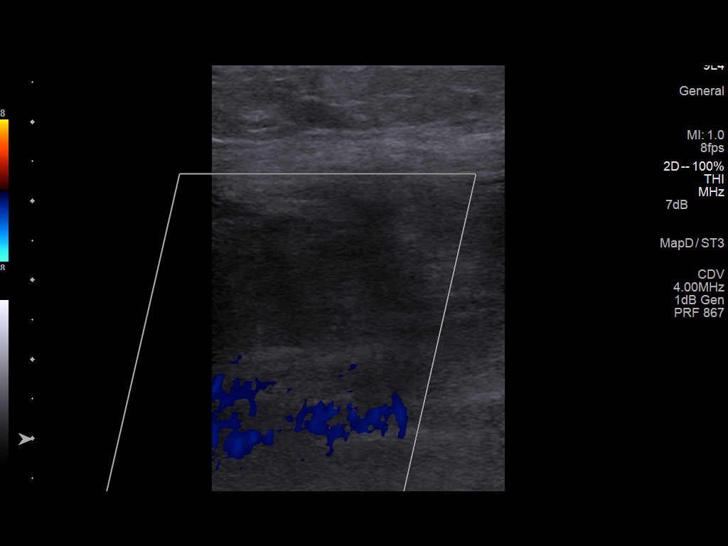

[13 of 24 positions shown; findings below may reference images not displayed]

FINDINGS: Contralateral Common Femoral Vein: Respiratory phasicity is normal
and symmetric with the symptomatic side. No evidence of thrombus.
Normal compressibility.

Common Femoral Vein: No evidence of thrombus. Normal
compressibility, respiratory phasicity and response to augmentation.

Saphenofemoral Junction: No evidence of thrombus. Normal
compressibility and flow on color Doppler imaging.

Profunda Femoral Vein: No evidence of thrombus. Normal
compressibility and flow on color Doppler imaging.

Femoral Vein: No evidence of thrombus. Normal compressibility,
respiratory phasicity and response to augmentation.

Popliteal Vein: No evidence of thrombus. Normal compressibility,
respiratory phasicity and response to augmentation.

Calf Veins: No evidence of thrombus. Normal compressibility and flow
on color Doppler imaging.

Superficial Great Saphenous Vein: No evidence of thrombus. Normal
compressibility and flow on color Doppler imaging.

Venous Reflux: Visualized in the common femoral vein and profunda
femoral vein.

Other Findings:  Prominent varicosities are seen in the calf region.
IMPRESSION: No evidence of deep venous thrombosis.

## 2016-02-26 IMAGING — CR DG CHEST 2V
2 series · 2 of 2 positions shown · non-contrast
Comparison: 04/03/2014

CLINICAL DATA: Initial encounter for shortness of breath and pain
radiating down both arms.

EXAM:
CHEST  2 VIEW

[view not recorded (1 of 2)]
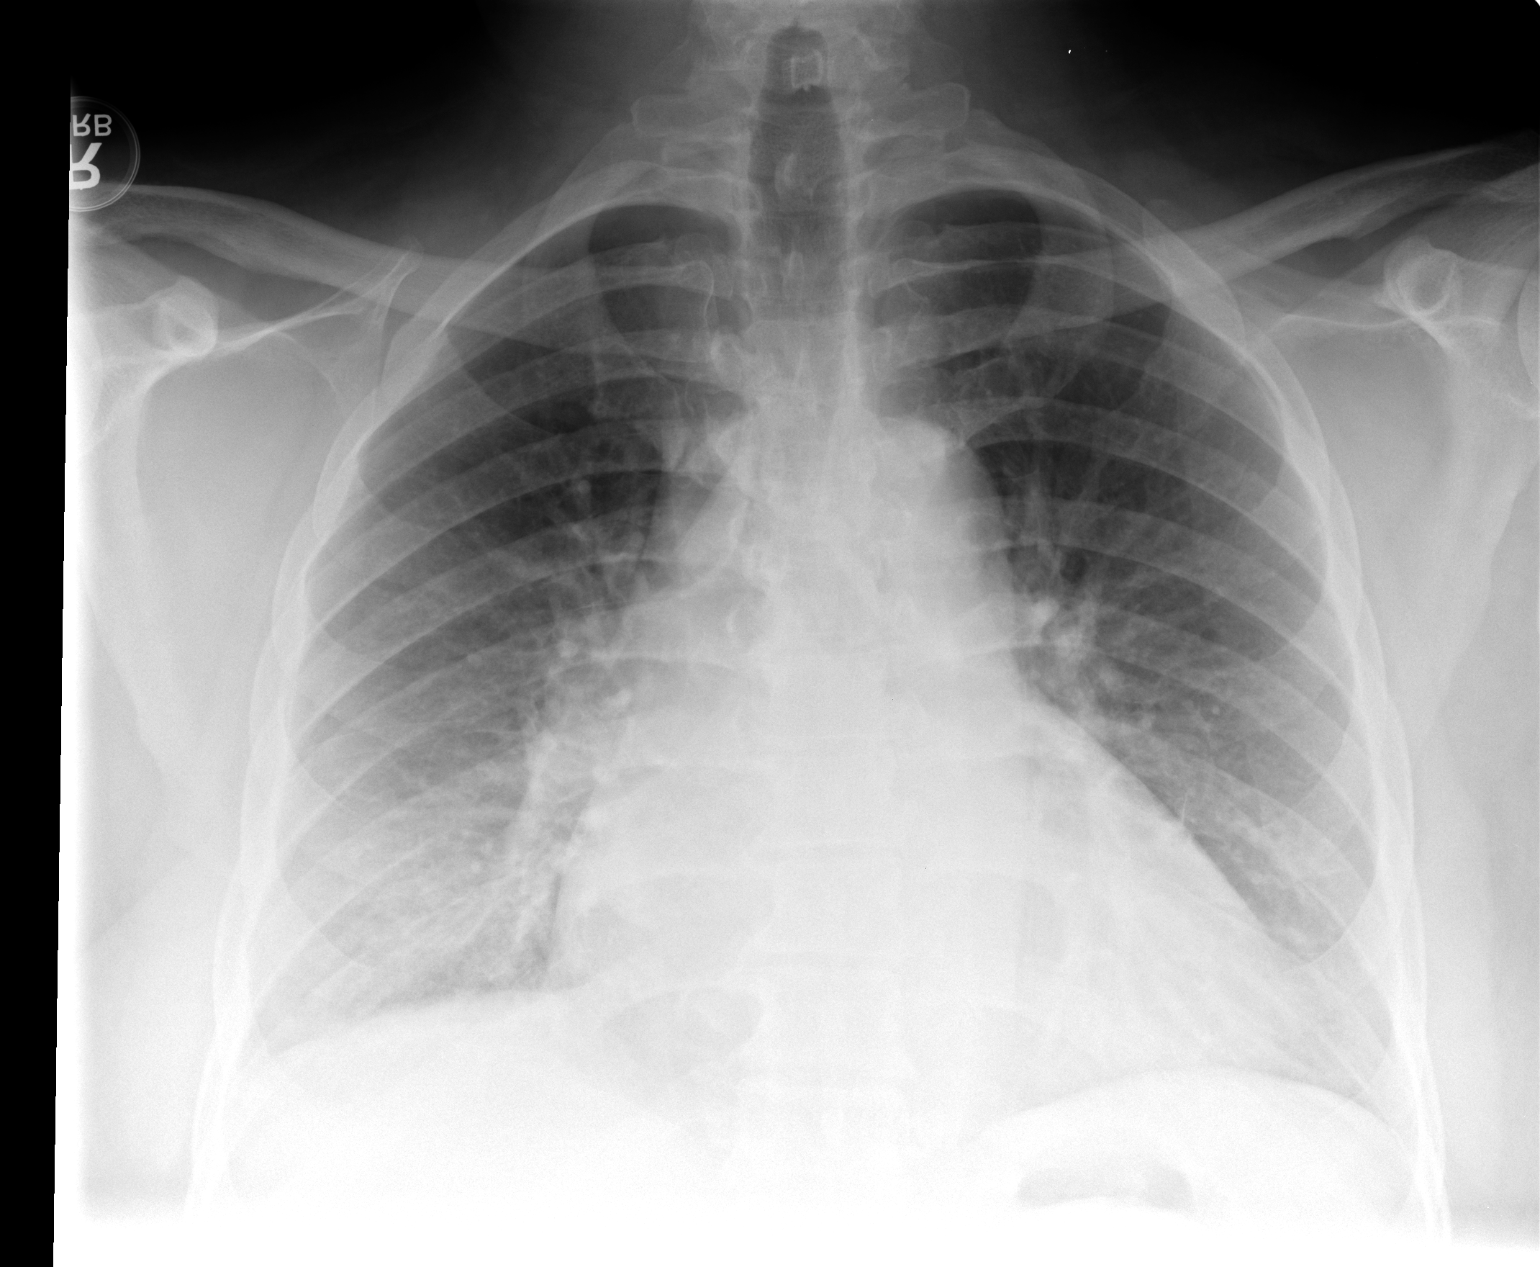

[view not recorded (2 of 2)]
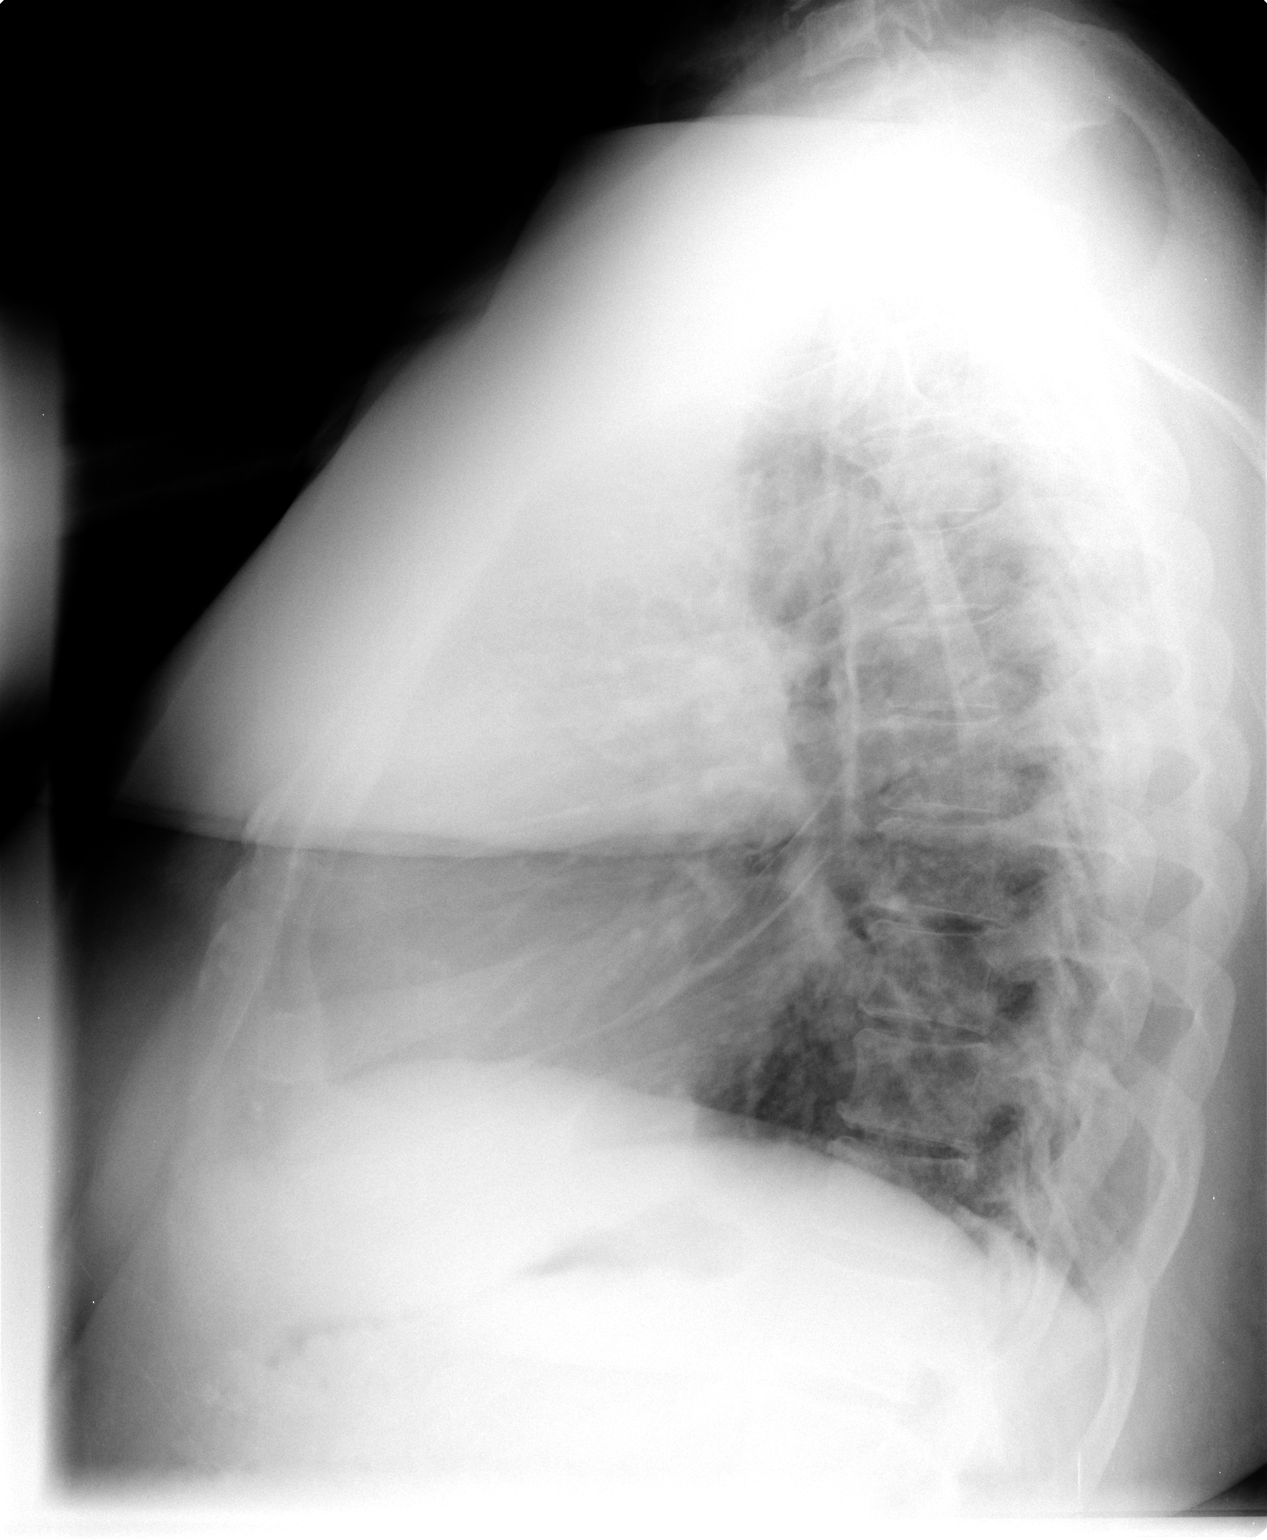

[2 of 2 positions shown; findings below may reference images not displayed]

FINDINGS: The cardio pericardial silhouette is enlarged. Vascular congestion
noted without overt pulmonary edema. No pneumothorax, focal airspace
consolidation, or pleural effusion. Imaged bony structures of the
thorax are intact.
IMPRESSION: Cardiomegaly with vascular congestion.

## 2016-03-03 ENCOUNTER — Emergency Department (HOSPITAL_COMMUNITY)
Admission: EM | Admit: 2016-03-03 | Discharge: 2016-03-03 | Disposition: A | Discharge status: Home / Self Care | Payer: Medicare Other | Attending: Emergency Medicine | Admitting: Emergency Medicine

## 2016-03-03 ENCOUNTER — Emergency Department (HOSPITAL_COMMUNITY): Payer: Medicare Other

## 2016-03-03 ENCOUNTER — Encounter (HOSPITAL_COMMUNITY): Payer: Self-pay | Admitting: Emergency Medicine

## 2016-03-03 DIAGNOSIS — I5043 Acute on chronic combined systolic (congestive) and diastolic (congestive) heart failure: Secondary | ICD-10-CM | POA: Insufficient documentation

## 2016-03-03 DIAGNOSIS — R109 Unspecified abdominal pain: Secondary | ICD-10-CM | POA: Insufficient documentation

## 2016-03-03 DIAGNOSIS — R609 Edema, unspecified: Secondary | ICD-10-CM | POA: Diagnosis not present

## 2016-03-03 DIAGNOSIS — J45909 Unspecified asthma, uncomplicated: Secondary | ICD-10-CM | POA: Insufficient documentation

## 2016-03-03 DIAGNOSIS — Z79899 Other long term (current) drug therapy: Secondary | ICD-10-CM | POA: Insufficient documentation

## 2016-03-03 DIAGNOSIS — R0602 Shortness of breath: Secondary | ICD-10-CM | POA: Diagnosis not present

## 2016-03-03 DIAGNOSIS — R05 Cough: Secondary | ICD-10-CM | POA: Diagnosis not present

## 2016-03-03 DIAGNOSIS — Z87891 Personal history of nicotine dependence: Secondary | ICD-10-CM | POA: Diagnosis not present

## 2016-03-03 DIAGNOSIS — I110 Hypertensive heart disease with heart failure: Secondary | ICD-10-CM | POA: Diagnosis not present

## 2016-03-03 DIAGNOSIS — M7989 Other specified soft tissue disorders: Secondary | ICD-10-CM | POA: Diagnosis present

## 2016-03-03 LAB — BASIC METABOLIC PANEL
Anion gap: 6 (ref 5–15)
BUN: 15 mg/dL (ref 6–20)
CO2: 27 mmol/L (ref 22–32)
Calcium: 8.5 mg/dL — ABNORMAL LOW (ref 8.9–10.3)
Chloride: 107 mmol/L (ref 101–111)
Creatinine, Ser: 1.04 mg/dL (ref 0.61–1.24)
GFR calc Af Amer: >60 mL/min (ref >60)
GFR calc non Af Amer: >60 mL/min (ref >60)
Glucose, Bld: 91 mg/dL (ref 65–99)
Potassium: 3.4 mmol/L — ABNORMAL LOW (ref 3.5–5.1)
Sodium: 140 mmol/L (ref 135–145)

## 2016-03-03 LAB — CBC
HCT: 33.8 % — ABNORMAL LOW (ref 39.0–52.0)
Hemoglobin: 10.6 g/dL — ABNORMAL LOW (ref 13.0–17.0)
MCH: 26.2 pg (ref 26.0–34.0)
MCHC: 31.4 g/dL (ref 30.0–36.0)
MCV: 83.7 fL (ref 78.0–100.0)
Platelets: 258 10*3/uL (ref 150–400)
RBC: 4.04 MIL/uL — ABNORMAL LOW (ref 4.22–5.81)
RDW: 15.8 % — ABNORMAL HIGH (ref 11.5–15.5)
WBC: 3.8 10*3/uL — ABNORMAL LOW (ref 4.0–10.5)

## 2016-03-03 LAB — TROPONIN I: Troponin I: <0.03 ng/mL (ref <0.03)

## 2016-03-03 LAB — BRAIN NATRIURETIC PEPTIDE: B Natriuretic Peptide: 544 pg/mL — ABNORMAL HIGH (ref 0.0–100.0)

## 2016-03-03 MED ORDER — HYDROCHLOROTHIAZIDE 25 MG PO TABS
25.0000 mg | ORAL_TABLET | Freq: Once | ORAL | Status: AC
  Administered 2016-03-03: 25 mg via ORAL
  Filled 2016-03-03: qty 1

## 2016-03-03 MED ORDER — FUROSEMIDE 10 MG/ML IJ SOLN
80.0000 mg | Freq: Once | INTRAMUSCULAR | Status: DC
  Filled 2016-03-03: qty 8

## 2016-03-03 MED ORDER — POTASSIUM CHLORIDE CRYS ER 20 MEQ PO TBCR
60.0000 meq | EXTENDED_RELEASE_TABLET | Freq: Once | ORAL | Status: AC
  Administered 2016-03-03: 60 meq via ORAL
  Filled 2016-03-03: qty 3

## 2016-03-03 MED ORDER — FUROSEMIDE 10 MG/ML IJ SOLN
80.0000 mg | Freq: Once | INTRAMUSCULAR | Status: AC
  Administered 2016-03-03: 80 mg via INTRAVENOUS

## 2016-03-03 NOTE — ED Triage Notes (Signed)
Pt c/o increased swelling in ble, hands and abdomen. C/o some intermittent sob.

## 2016-03-03 NOTE — ED Provider Notes (Signed)
[facility]Y DEPT Provider Note   CSN: [id number] Arrival date & time: 03/03/16  1101  By signing my name below, I, [name], attest that this documentation has been prepared under the direction and in the presence of [name], MD. Electronically Signed: Aggie MoatsJenny Fitzpatrick, ED Scribe. 03/03/16. 1:18 PM.  History   Chief Complaint Chief Complaint  Patient presents with  . Leg Swelling   The history is provided by the patient. No language interpreter was used.   HPI Comments:  [name] is a 50 y.o. male with PMHx of obesity, HTN, CHF, COPD and atrial fibrillation who presents to the Emergency Department complaining of increased edema in bilateral lower extremities, hand and abdomen, which started 2-3 days ago. Pt was seen for SOB 1 month ago and was given albuterol inhaler. Associated symptoms include intermittent SOB, cough, difficulty bending legs secondary to swelling and abdominal pain with breathing. He believes that Lasix makes legs swell more and reports that he is compliant with medications. No denials reported.  Past Medical History:  Diagnosis Date  . Allergic rhinitis   . Asthma   . Atrial fibrillation (HCC)   . Essential hypertension   . Gastroesophageal reflux disease   . History of cardiac catheterization    Normal coronaries December 2016  . Noncompliance    Major problem leading to declining health and recurrent hospitalization  . Nonischemic cardiomyopathy (HCC)    LVEF 20-25%  . OSA (obstructive sleep apnea)   . Osteoarthritis   . Peptic ulcer disease     Patient Active Problem List   Diagnosis Date Noted  . Acute on chronic congestive heart failure (HCC)   . Coronary arteries, normal Dec 2016 01/23/2016  . Chronic anticoagulation-Xarelto 01/23/2016  . Chronic renal insufficiency, stage III (moderate) 01/23/2016  . NSVT (nonsustained ventricular tachycardia) (HCC) 01/23/2016  . CHF (congestive heart failure) (HCC) 01/20/2016  . CHF exacerbation (HCC)  01/20/2016  . Acute systolic CHF (congestive heart failure) (HCC) 12/21/2015  . Acute on chronic combined systolic and diastolic CHF (congestive heart failure) (HCC) 12/21/2015  . Elevated troponin 12/21/2015  . Congestive heart failure (CHF) (HCC) 12/03/2015  . Acute on chronic combined systolic (congestive) and diastolic (congestive) heart failure (HCC)   . Nonischemic cardiomyopathy (HCC)   . Morbid obesity due to excess calories (HCC)   . Noncompliance with diet and medication regimen 12/02/2015  . Acute on chronic systolic congestive heart failure (HCC)   . Acute diastolic CHF (congestive heart failure) (HCC) 11/18/2015  . Acute renal failure superimposed on stage 3 chronic kidney disease (HCC) 10/28/2015  . Acute on chronic combined systolic and diastolic heart failure (HCC) 10/08/2015  . OSA (obstructive sleep apnea) 09/20/2015  . Pulmonary hypertension (HCC) 09/19/2015  . Leukopenia 09/19/2015  . Normocytic anemia 09/19/2015  . Chronic atrial fibrillation (HCC)   . Dyspnea on exertion 05/28/2015  . Acute respiratory failure with hypoxia (HCC) 04/01/2015  . Pneumonia 04/01/2015  . Hypokalemia 04/01/2015  . Obesity hypoventilation syndrome (HCC) 08/08/2014  . GERD (gastroesophageal reflux disease) 08/08/2014  . Noncompliance with medication regimen 06/28/2014  . Atrial fibrillation with rapid ventricular response (HCC) 06/28/2014  . Edema of right lower extremity 06/21/2014  . Fecal urgency 06/21/2014  . Asthma 02/11/2009  . Hyperlipidemia 07/12/2008  . OBESITY, MORBID 07/12/2008  . Anxiety state 07/12/2008  . DEPRESSION 07/12/2008  . Essential hypertension 07/12/2008  . ALLERGIC RHINITIS 07/12/2008  . GERD 07/12/2008  . Peptic ulcer 07/12/2008  . OSTEOARTHRITIS 07/12/2008  Past Surgical History:  Procedure Laterality Date  . CARDIAC CATHETERIZATION N/A 06/14/2015   Procedure: Right/Left Heart Cath and Coronary Angiography;  Surgeon: Runell Gess, MD;  Location:  [facility] INVASIVE CV LAB;  Service: Cardiovascular;  Laterality: N/A;       Home Medications    Prior to Admission medications   Medication Sig Start Date End Date Taking? Authorizing Provider  atorvastatin (LIPITOR) 80 MG tablet Take 80 mg by mouth daily.    Historical Provider, MD  beclomethasone (QVAR) 80 MCG/ACT inhaler Inhale 2 puffs into the lungs daily as needed (shortness of breath). 11/21/15   Erick Blinks, MD  furosemide (LASIX) 80 MG tablet Take 1 tablet (80 mg total) by mouth 2 (two) times daily. 12/02/15   Jodelle Gross, NP  lisinopril (PRINIVIL,ZESTRIL) 5 MG tablet Take 1 tablet (5 mg total) by mouth daily. 01/17/16   Glynn Octave, MD  metoprolol succinate (TOPROL-XL) 100 MG 24 hr tablet Take 1 tablet (100 mg total) by mouth 2 (two) times daily. Take with or immediately following a meal. 01/17/16   Glynn Octave, MD  potassium chloride SA (K-DUR,KLOR-CON) 20 MEQ tablet Take 2 tablets 2 times daily for 7 days and then 1 tablet 2 times daily thereafter. Patient taking differently: Take 20 mEq by mouth 2 (two) times daily.  01/09/16   Elliot Cousin, MD  rivaroxaban (XARELTO) 20 MG TABS tablet Take 1 tablet (20 mg total) by mouth every evening. Patient taking differently: Take 20 mg by mouth at bedtime.  11/21/15   Erick Blinks, MD    Family History Family History  Problem Relation Age of Onset  . Stroke Father   . Heart attack Father   . Aneurysm Mother     Cerebral aneurysm  . Hypertension Sister   . Colon cancer Neg Hx   . Inflammatory bowel disease Neg Hx   . Liver disease Neg Hx     Social History Social History  Substance Use Topics  . Smoking status: Former Smoker    Packs/day: 0.50    Years: 20.00    Types: Cigarettes    Start date: 04/26/1988    Quit date: 06/23/2007  . Smokeless tobacco: Never Used     Comment: 1 ppd former smoker  . Alcohol use No     Comment: No etoh since 2009     Allergies   Bee venom; Lipitor [atorvastatin]; Aspirin; Banana;  Diltiazem; and Hydralazine   Review of Systems Review of Systems  Respiratory: Positive for cough and shortness of breath.   Cardiovascular: Positive for leg swelling.  Gastrointestinal: Positive for abdominal pain.  All other systems reviewed and are negative.    Physical Exam Updated Vital Signs BP (!) 168/107 (BP Location: Left Arm)   Pulse (!) 58   Temp 98.4 F (36.9 C) (Oral)   Resp 20   Ht 6' (1.829 m)   Wt 299 lb (135.6 kg)   SpO2 100%   BMI 40.55 kg/m   Physical Exam  Constitutional: He is oriented to person, place, and time. He appears well-developed and well-nourished.  HENT:  Head: Normocephalic and atraumatic.  Right Ear: External ear normal.  Left Ear: External ear normal.  Eyes: Conjunctivae and EOM are normal. Pupils are equal, round, and reactive to light.  Neck: Normal range of motion and phonation normal. Neck supple.  Cardiovascular: Normal rate, regular rhythm and normal heart sounds.   Very severe pitting lower extremity edema.  Pulmonary/Chest: Effort normal and breath sounds normal.  He exhibits no bony tenderness.  Abdominal: Soft. There is no tenderness.  Musculoskeletal: Normal range of motion. He exhibits edema.  Legs appear roughly symmetric  Neurological: He is alert and oriented to person, place, and time. No cranial nerve deficit or sensory deficit. He exhibits normal muscle tone. Coordination normal.  Skin: Skin is warm, dry and intact.  Psychiatric: He has a normal mood and affect. His behavior is normal. Judgment and thought content normal.  Nursing note and vitals reviewed.    ED Treatments / Results  DIAGNOSTIC STUDIES:  Oxygen Saturation is 100% on room air, normal by my interpretation.    COORDINATION OF CARE:  1:16 PM Discussed treatment plan with pt at bedside and pt agreed to plan.  Labs (all labs ordered are listed, but only abnormal results are displayed) Labs Reviewed  BASIC METABOLIC PANEL  CBC  BRAIN NATRIURETIC  PEPTIDE  TROPONIN I    EKG  EKG Interpretation None       Radiology Dg Chest 2 View  Result Date: 03/03/2016 CLINICAL DATA:  Increased swelling in bilateral lower extremities hands and abdomen. Shortness of breath EXAM: CHEST  2 VIEW COMPARISON:  07-Sep-202017 FINDINGS: PA and lateral views of the chest demonstrate enlarged heart, mildly decreased compared to prior. There is mild degree of central vascular congestion with overall decreased pulmonary edema compared to the prior study. Trace fluid or thickening present along the fissures on the lateral view. No increasing effusion identified. No pneumothorax. IMPRESSION: 1. Mild to moderate cardiomegaly, has slightly decreased compared to prior; overall improved aeration with decreased pulmonary edema. Mild central vascular congestion remains. 2. No acute consolidations. Electronically Signed   By: Jasmine Pang M.D.   On: 03/03/2016 11:54    Procedures Procedures (including critical care time)  Medications Ordered in ED Medications - No data to display   Initial Impression / Assessment and Plan / ED Course  I have reviewed the triage vital signs and the nursing notes.  Pertinent labs & imaging results that were available during my care of the patient were reviewed by me and considered in my medical decision making (see chart for details).  Clinical Course   49yM with volume overload. Probably from noncompliance. No respiratory distress. I feel he can be discharged but he really needs to take his meds as prescribed. He says he is but I have my doubts. Return precautions discussed. Outpt FU otherwise.   Final Clinical Impressions(s) / ED Diagnoses   Final diagnoses:  Peripheral edema    New Prescriptions New Prescriptions   No medications on file   I personally preformed the services scribed in my presence. The recorded information has been reviewed is accurate. [name], MD.     [name], MD 03/05/16 [phone]

## 2016-03-04 IMAGING — CR DG CHEST 1V PORT
1 series · 1 of 1 positions shown · non-contrast
Comparison: 06/21/2014

CLINICAL DATA: Shortness of breath this morning.

EXAM:
PORTABLE CHEST - 1 VIEW

[portable]
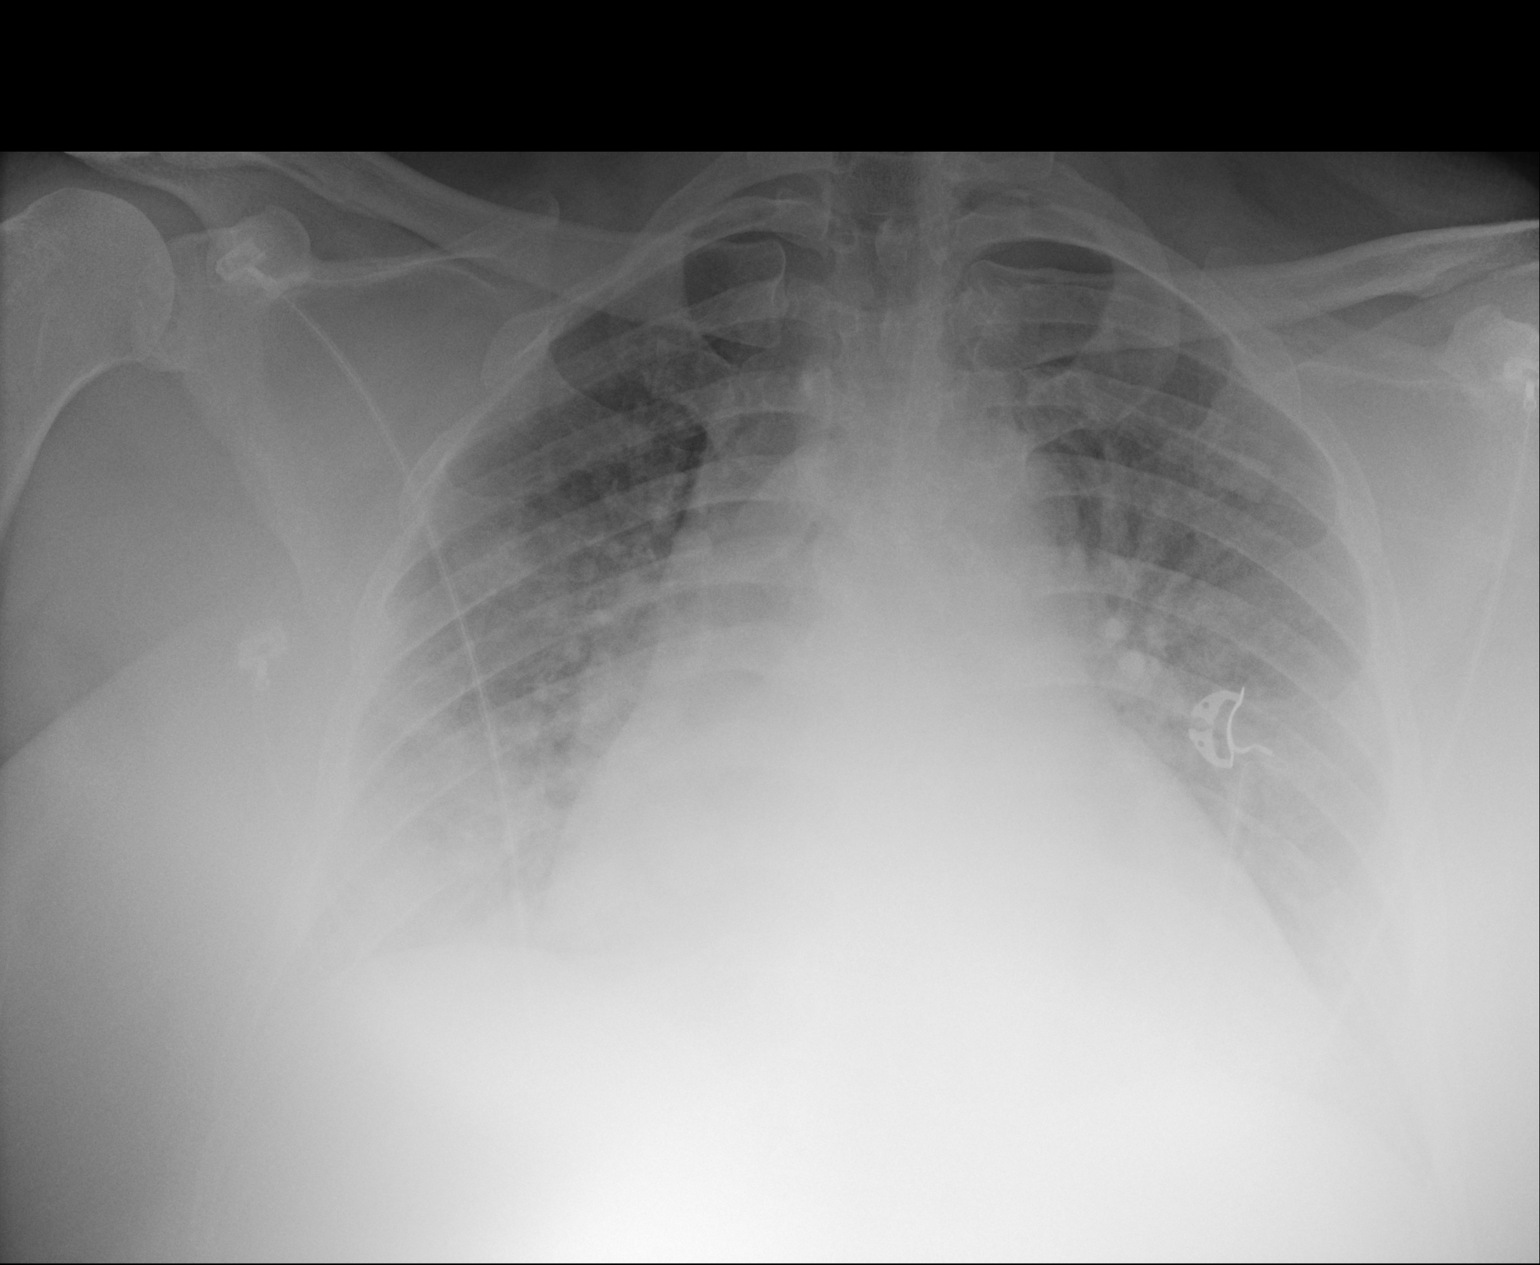

[1 of 1 positions shown; findings below may reference images not displayed]

FINDINGS: Cardiac enlargement with pulmonary vascular congestion. Developing
hazy perihilar opacities suggesting developing edema. No blunting of
costophrenic angles. No pneumothorax. No focal consolidation.
IMPRESSION: Cardiac enlargement with pulmonary vascular congestion and
developing perihilar edema.

## 2016-03-12 ENCOUNTER — Telehealth (HOSPITAL_COMMUNITY): Payer: Self-pay | Admitting: Surgery

## 2016-03-12 NOTE — Telephone Encounter (Signed)
I received a call in reference to [name] and the possibility of him coming to the [facility].  He is appropriate for referral as he has many documented ED visits and hospitalizations secondary to HF.  The latest note from Dr [name] reflects that [name] is in the process of being discharged from his practice secondary to noncompliance to medical treatment plan.  The [facility] Manager tells me that it is difficult to get a handle on which medications he actually taking as he feels "certain that the PO diuretic is causing fluid retention" and cannot be persuaded differently.  I have asked her to speak with him and if he is agreeable we will be willing to accept him to the [facility] for an appt.  She is to call me back if this is the case.

## 2016-03-21 ENCOUNTER — Emergency Department (HOSPITAL_COMMUNITY): Payer: Medicare Other

## 2016-03-21 ENCOUNTER — Emergency Department (HOSPITAL_COMMUNITY)
Admission: EM | Admit: 2016-03-21 | Discharge: 2016-03-21 | Disposition: A | Discharge status: Home / Self Care | Payer: Medicare Other | Attending: Emergency Medicine | Admitting: Emergency Medicine

## 2016-03-21 ENCOUNTER — Encounter (HOSPITAL_COMMUNITY): Payer: Self-pay | Admitting: Cardiology

## 2016-03-21 DIAGNOSIS — I509 Heart failure, unspecified: Secondary | ICD-10-CM | POA: Insufficient documentation

## 2016-03-21 DIAGNOSIS — N183 Chronic kidney disease, stage 3 (moderate): Secondary | ICD-10-CM | POA: Insufficient documentation

## 2016-03-21 DIAGNOSIS — I130 Hypertensive heart and chronic kidney disease with heart failure and stage 1 through stage 4 chronic kidney disease, or unspecified chronic kidney disease: Secondary | ICD-10-CM | POA: Diagnosis not present

## 2016-03-21 DIAGNOSIS — Z87891 Personal history of nicotine dependence: Secondary | ICD-10-CM | POA: Diagnosis not present

## 2016-03-21 DIAGNOSIS — J45909 Unspecified asthma, uncomplicated: Secondary | ICD-10-CM | POA: Insufficient documentation

## 2016-03-21 DIAGNOSIS — R0602 Shortness of breath: Secondary | ICD-10-CM | POA: Diagnosis present

## 2016-03-21 DIAGNOSIS — E876 Hypokalemia: Secondary | ICD-10-CM | POA: Insufficient documentation

## 2016-03-21 DIAGNOSIS — Z79899 Other long term (current) drug therapy: Secondary | ICD-10-CM | POA: Insufficient documentation

## 2016-03-21 LAB — CBC
HCT: 32.4 % — ABNORMAL LOW (ref 39.0–52.0)
HEMOGLOBIN: 10 g/dL — AB (ref 13.0–17.0)
MCH: 25.6 pg — ABNORMAL LOW (ref 26.0–34.0)
MCHC: 30.9 g/dL (ref 30.0–36.0)
MCV: 83.1 fL (ref 78.0–100.0)
Platelets: 207 10*3/uL (ref 150–400)
RBC: 3.9 MIL/uL — AB (ref 4.22–5.81)
RDW: 16.2 % — ABNORMAL HIGH (ref 11.5–15.5)
WBC: 3.6 10*3/uL — ABNORMAL LOW (ref 4.0–10.5)

## 2016-03-21 LAB — BRAIN NATRIURETIC PEPTIDE: B NATRIURETIC PEPTIDE 5: 724 pg/mL — AB (ref 0.0–100.0)

## 2016-03-21 LAB — BASIC METABOLIC PANEL
ANION GAP: 6 (ref 5–15)
BUN: 19 mg/dL (ref 6–20)
CALCIUM: 8.5 mg/dL — AB (ref 8.9–10.3)
CO2: 26 mmol/L (ref 22–32)
Chloride: 107 mmol/L (ref 101–111)
Creatinine, Ser: 1.12 mg/dL (ref 0.61–1.24)
GLUCOSE: 92 mg/dL (ref 65–99)
Potassium: 3.1 mmol/L — ABNORMAL LOW (ref 3.5–5.1)
SODIUM: 139 mmol/L (ref 135–145)

## 2016-03-21 LAB — TROPONIN I

## 2016-03-21 MED ORDER — POTASSIUM CHLORIDE CRYS ER 20 MEQ PO TBCR
40.0000 meq | EXTENDED_RELEASE_TABLET | Freq: Once | ORAL | Status: AC
  Administered 2016-03-21: 40 meq via ORAL
  Filled 2016-03-21: qty 2

## 2016-03-21 MED ORDER — FUROSEMIDE 10 MG/ML IJ SOLN
60.0000 mg | INTRAMUSCULAR | Status: AC
  Administered 2016-03-21: 60 mg via INTRAVENOUS
  Filled 2016-03-21: qty 6

## 2016-03-21 MED ORDER — METOPROLOL SUCCINATE ER 50 MG PO TB24
100.0000 mg | ORAL_TABLET | Freq: Every day | ORAL | Status: DC
  Administered 2016-03-21: 100 mg via ORAL
  Filled 2016-03-21 (×3): qty 2

## 2016-03-21 NOTE — ED Triage Notes (Addendum)
Released from [facility] ER at 1:00  am with c/o weakness and chest tightness.  Family called EMS this morning  for pt being  unresponsive .  When EMS arrived pt was lethargic .  CBG 119. Pt c/o chest tightness, weakness and sob.    Lower extremity swelling.  B/p 176/100. Pt in A Fib with occasional PVC's  , history of a fib

## 2016-03-21 NOTE — Discharge Instructions (Signed)
Continue to take your home medicine exactly as prescribed including your fluid pill and your Metoprolol - you MUST follow up within 2 days for recheck  ER if your symptoms worsen

## 2016-03-21 NOTE — ED Notes (Signed)
Pt reports when he takes his Demadex it makes his legs swell.

## 2016-03-21 NOTE — ED Provider Notes (Signed)
[facility]Y DEPT Provider Note   CSN: [id number] Arrival date & time: 03/21/16  4098  By signing my name below, I, [name], attest that this documentation has been prepared under the direction and in the presence of Eber Hong, MD. Electronically Signed: Doreatha Fitzpatrick, ED Scribe. 03/21/16. 8:10 AM.     History   Chief Complaint Chief Complaint  Patient presents with  . Shortness of Breath    HPI [name] is a 50 y.o. male with h/o CHF on Demadex and Metoprolol, Afib on Xarelto who presents to the Emergency Department complaining of moderate, increased BLE edema onset this morning with associated SOB, chest tightness, generalized weakness. Pt was last evaluated in the ED here for the same symptoms 3 weeks ago and reports his symptoms have been gradually worsening since. He was also evaluated at [facility] last night and was given IV diuretics before being discharged this morning. Per pt, he urinated 6x last night with adequate urine volume. He also complains of nausea at this time. Pt states he was recently switched from Lasix to Demadex; however, his swelling persists. He states he intends to take his diuretic 1-2x per day, but believes the pills exacerbate his swelling and as a result, frequently skips doses. Per cardiology clinic note, they are in process of d/c him from the clinic as a result of his ongoing noncompliance with Demadex and their treatment plan. He notes he has not taken his dose of Metoprolol last night or this morning. He denies fever, chills.     The history is provided by the patient. No language interpreter was used.    Past Medical History:  Diagnosis Date  . Allergic rhinitis   . Asthma   . Atrial fibrillation (HCC)   . Essential hypertension   . Gastroesophageal reflux disease   . History of cardiac catheterization    Normal coronaries December 2016  . Noncompliance    Major problem leading to declining health and recurrent hospitalization  .  Nonischemic cardiomyopathy (HCC)    LVEF 20-25%  . OSA (obstructive sleep apnea)   . Osteoarthritis   . Peptic ulcer disease     Patient Active Problem List   Diagnosis Date Noted  . Acute on chronic congestive heart failure (HCC)   . Coronary arteries, normal Dec 2016 01/23/2016  . Chronic anticoagulation-Xarelto 01/23/2016  . Chronic renal insufficiency, stage III (moderate) 01/23/2016  . NSVT (nonsustained ventricular tachycardia) (HCC) 01/23/2016  . CHF (congestive heart failure) (HCC) 01/20/2016  . CHF exacerbation (HCC) 01/20/2016  . Acute systolic CHF (congestive heart failure) (HCC) 12/21/2015  . Acute on chronic combined systolic and diastolic CHF (congestive heart failure) (HCC) 12/21/2015  . Elevated troponin 12/21/2015  . Congestive heart failure (CHF) (HCC) 12/03/2015  . Acute on chronic combined systolic (congestive) and diastolic (congestive) heart failure (HCC)   . Nonischemic cardiomyopathy (HCC)   . Morbid obesity due to excess calories (HCC)   . Noncompliance with diet and medication regimen 12/02/2015  . Acute on chronic systolic congestive heart failure (HCC)   . Acute diastolic CHF (congestive heart failure) (HCC) 11/18/2015  . Acute renal failure superimposed on stage 3 chronic kidney disease (HCC) 10/28/2015  . Acute on chronic combined systolic and diastolic heart failure (HCC) 10/08/2015  . OSA (obstructive sleep apnea) 09/20/2015  . Pulmonary hypertension (HCC) 09/19/2015  . Leukopenia 09/19/2015  . Normocytic anemia 09/19/2015  . Chronic atrial fibrillation (HCC)   . Dyspnea on exertion 05/28/2015  . Acute respiratory failure  with hypoxia (HCC) 04/01/2015  . Pneumonia 04/01/2015  . Hypokalemia 04/01/2015  . Obesity hypoventilation syndrome (HCC) 08/08/2014  . GERD (gastroesophageal reflux disease) 08/08/2014  . Noncompliance with medication regimen 06/28/2014  . Atrial fibrillation with rapid ventricular response (HCC) 06/28/2014  . Edema of right  lower extremity 06/21/2014  . Fecal urgency 06/21/2014  . Asthma 02/11/2009  . Hyperlipidemia 07/12/2008  . OBESITY, MORBID 07/12/2008  . Anxiety state 07/12/2008  . DEPRESSION 07/12/2008  . Essential hypertension 07/12/2008  . ALLERGIC RHINITIS 07/12/2008  . GERD 07/12/2008  . Peptic ulcer 07/12/2008  . OSTEOARTHRITIS 07/12/2008    Past Surgical History:  Procedure Laterality Date  . CARDIAC CATHETERIZATION N/A 06/14/2015   Procedure: Right/Left Heart Cath and Coronary Angiography;  Surgeon: Runell GessJonathan J Berry, MD;  [facility];  Service: Cardiovascular;  Laterality: N/A;       Home Medications    Prior to Admission medications   Medication Sig Start Date End Date Taking? Authorizing Provider  atorvastatin (LIPITOR) 80 MG tablet Take 80 mg by mouth daily.   Yes Historical Provider, MD  beclomethasone (QVAR) 80 MCG/ACT inhaler Inhale 2 puffs into the lungs daily as needed (shortness of breath). 11/21/15  Yes Erick BlinksJehanzeb Memon, MD  furosemide (LASIX) 80 MG tablet Take 1 tablet (80 mg total) by mouth 2 (two) times daily. 12/02/15  Yes Jodelle GrossKathryn M Lawrence, NP  lisinopril (PRINIVIL,ZESTRIL) 5 MG tablet Take 1 tablet (5 mg total) by mouth daily. 01/17/16  Yes Glynn OctaveStephen Rancour, MD  metoprolol succinate (TOPROL-XL) 100 MG 24 hr tablet Take 1 tablet (100 mg total) by mouth 2 (two) times daily. Take with or immediately following a meal. 01/17/16  Yes Glynn OctaveStephen Rancour, MD  potassium chloride SA (K-DUR,KLOR-CON) 20 MEQ tablet Take 2 tablets 2 times daily for 7 days and then 1 tablet 2 times daily thereafter. Patient taking differently: Take 20 mEq by mouth 2 (two) times daily.  01/09/16  Yes Elliot Cousinenise Fisher, MD  rivaroxaban (XARELTO) 20 MG TABS tablet Take 1 tablet (20 mg total) by mouth every evening. Patient taking differently: Take 20 mg by mouth at bedtime.  11/21/15  Yes Erick BlinksJehanzeb Memon, MD    Family History Family History  Problem Relation Age of Onset  . Stroke Father   . Heart  attack Father   . Aneurysm Mother     Cerebral aneurysm  . Hypertension Sister   . Colon cancer Neg Hx   . Inflammatory bowel disease Neg Hx   . Liver disease Neg Hx     Social History Social History  Substance Use Topics  . Smoking status: Former Smoker    Packs/day: 0.50    Years: 20.00    Types: Cigarettes    Start date: 04/26/1988    Quit date: 06/23/2007  . Smokeless tobacco: Never Used     Comment: 1 ppd former smoker  . Alcohol use No     Comment: No etoh since 2009     Allergies   Bee venom; Lipitor [atorvastatin]; Aspirin; Banana; Diltiazem; and Hydralazine   Review of Systems Review of Systems  Constitutional: Negative for chills and fever.  Respiratory: Positive for chest tightness and shortness of breath.   Cardiovascular: Positive for leg swelling.  Gastrointestinal: Positive for nausea.  Neurological: Positive for weakness (generalized).  All other systems reviewed and are negative.   Physical Exam Updated Vital Signs BP (!) 148/121 (BP Location: Right Arm)   Pulse 94   Temp 98 F (36.7 C) (Oral)  Resp 22   Ht 6' (1.829 m)   Wt 300 lb (136.1 kg)   SpO2 98%   BMI 40.69 kg/m   Physical Exam  Constitutional: He appears well-developed and well-nourished. No distress.  HENT:  Head: Normocephalic and atraumatic.  Mouth/Throat: Oropharynx is clear and moist. No oropharyngeal exudate.  Eyes: Conjunctivae and EOM are normal. Pupils are equal, round, and reactive to light. Right eye exhibits no discharge. Left eye exhibits no discharge. No scleral icterus.  Neck: Normal range of motion. Neck supple. No JVD present. No thyromegaly present.  Cardiovascular: Normal heart sounds and intact distal pulses.  Exam reveals no gallop and no friction rub.   No murmur heard. Afib at anywhere from 95-115  Pulmonary/Chest: Effort normal and breath sounds normal. No respiratory distress. He has no wheezes. He has no rales.  No rales. No tachypnea when lying flat. O2  sat @ 97% on RA.   Abdominal: Soft. Bowel sounds are normal. He exhibits no distension and no mass. There is no tenderness.  Musculoskeletal: Normal range of motion. He exhibits edema. He exhibits no tenderness.  Peripheral edema (R>L), chronic asymmetry.   Lymphadenopathy:    He has no cervical adenopathy.  Neurological: He is alert. Coordination normal.  Normal mental status, follows commands, normal memory   Skin: Skin is warm and dry. No rash noted. No erythema.  Psychiatric: He has a normal mood and affect. His behavior is normal.  Nursing note and vitals reviewed.    ED Treatments / Results    DIAGNOSTIC STUDIES: Oxygen Saturation is 97% on RA, normal by my interpretation.    COORDINATION OF CARE: 8:06 AM Discussed treatment plan with pt at bedside which includes lab work, CXR, EKG and pt agreed to plan. Encouraged pt to take diuretic as prescribed.     Labs (all labs ordered are listed, but only abnormal results are displayed) Labs Reviewed  BASIC METABOLIC PANEL - Abnormal; Notable for the following:       Result Value   Potassium 3.1 (*)    Calcium 8.5 (*)    All other components within normal limits  CBC - Abnormal; Notable for the following:    WBC 3.6 (*)    RBC 3.90 (*)    Hemoglobin 10.0 (*)    HCT 32.4 (*)    MCH 25.6 (*)    RDW 16.2 (*)    All other components within normal limits  BRAIN NATRIURETIC PEPTIDE - Abnormal; Notable for the following:    B Natriuretic Peptide 724.0 (*)    All other components within normal limits  TROPONIN I    EKG  EKG Interpretation  Date/Time:  Saturday March 21 2016 07:47:24 EDT Ventricular Rate:  102 PR Interval:    QRS Duration: 89 QT Interval:  371 QTC Calculation: 484 R Axis:   77 Text Interpretation:  Atrial fibrillation Borderline repolarization abnormality Borderline prolonged QT interval since last tracing no significant change Confirmed by Hyacinth Meeker  MD, Meriah Shands ([id number]) on 03/21/2016 8:14:51 AM        Radiology Dg Chest 2 View  Result Date: 03/21/2016 CLINICAL DATA:  Short of breath.  Leg swelling. EXAM: CHEST  2 VIEW COMPARISON:  03/20/2016 FINDINGS: Mild enlargement of the cardiac silhouette, stable. No mediastinal or hilar masses or evidence of adenopathy. Lungs are clear.  No pleural effusion.  No pneumothorax. Bony thorax is intact. IMPRESSION: 1. No acute cardiopulmonary disease. 2. Cardiomegaly.  No convincing congestive heart failure. Electronically Signed   By:  Amie Portland M.D.   On: 03/21/2016 09:00    Procedures Procedures (including critical care time)  Medications Ordered in ED Medications  metoprolol succinate (TOPROL-XL) 24 hr tablet 100 mg (100 mg Oral Given 03/21/16 0841)  potassium chloride SA (K-DUR,KLOR-CON) CR tablet 40 mEq (not administered)  furosemide (LASIX) injection 60 mg (60 mg Intravenous Given 03/21/16 0825)     Initial Impression / Assessment and Plan / ED Course  I have reviewed the triage vital signs and the nursing notes.  Pertinent labs & imaging results that were available during my care of the patient were reviewed by me and considered in my medical decision making (see chart for details).  Clinical Course  Comment By Time  K is low - (chronically), BNP close to baseline, CBC close to baseline, Cr at baseline, trop neg, ECG unchanged and CXR without edema.  Has had some improvement in rate with BB Eber Hong, MD 09/30 [phone]    Well appearing after meds given - heart rate is in the 90's.  SOB improved - he has been standing and walking in the room without dyspnea - no orthopnea, no signs of fluid overload on the xray and no ischemia on ECG.  Pt expressed understanding, K given replaced prior to d/c.  Final Clinical Impressions(s) / ED Diagnoses   Final diagnoses:  Hypokalemia  Chronic congestive heart failure, unspecified congestive heart failure type [facility])    New Prescriptions New Prescriptions   No medications on file    I  personally performed the services described in this documentation, which was scribed in my presence. The recorded information has been reviewed and is accurate.        Eber Hong, MD 03/21/16 [phone]

## 2016-03-21 NOTE — ED Notes (Signed)
MD at bedside. 

## 2016-03-24 ENCOUNTER — Encounter (HOSPITAL_COMMUNITY): Payer: Self-pay | Admitting: Emergency Medicine

## 2016-03-24 ENCOUNTER — Emergency Department (HOSPITAL_COMMUNITY)
Admission: EM | Admit: 2016-03-24 | Discharge: 2016-03-25 | Disposition: A | Discharge status: Home / Self Care | Payer: Medicare Other | Attending: Emergency Medicine | Admitting: Emergency Medicine

## 2016-03-24 ENCOUNTER — Emergency Department (HOSPITAL_COMMUNITY): Payer: Medicare Other

## 2016-03-24 DIAGNOSIS — J45909 Unspecified asthma, uncomplicated: Secondary | ICD-10-CM | POA: Diagnosis not present

## 2016-03-24 DIAGNOSIS — I5043 Acute on chronic combined systolic (congestive) and diastolic (congestive) heart failure: Secondary | ICD-10-CM | POA: Insufficient documentation

## 2016-03-24 DIAGNOSIS — R609 Edema, unspecified: Secondary | ICD-10-CM | POA: Insufficient documentation

## 2016-03-24 DIAGNOSIS — I130 Hypertensive heart and chronic kidney disease with heart failure and stage 1 through stage 4 chronic kidney disease, or unspecified chronic kidney disease: Secondary | ICD-10-CM | POA: Diagnosis not present

## 2016-03-24 DIAGNOSIS — I509 Heart failure, unspecified: Secondary | ICD-10-CM

## 2016-03-24 DIAGNOSIS — Z87891 Personal history of nicotine dependence: Secondary | ICD-10-CM | POA: Diagnosis not present

## 2016-03-24 DIAGNOSIS — Z7901 Long term (current) use of anticoagulants: Secondary | ICD-10-CM | POA: Diagnosis not present

## 2016-03-24 DIAGNOSIS — M7989 Other specified soft tissue disorders: Secondary | ICD-10-CM | POA: Diagnosis present

## 2016-03-24 DIAGNOSIS — Z79899 Other long term (current) drug therapy: Secondary | ICD-10-CM | POA: Diagnosis not present

## 2016-03-24 DIAGNOSIS — N183 Chronic kidney disease, stage 3 (moderate): Secondary | ICD-10-CM | POA: Diagnosis not present

## 2016-03-24 HISTORY — DX: Acute myocardial infarction, unspecified: I21.9

## 2016-03-24 LAB — CBC WITH DIFFERENTIAL/PLATELET
BASOS ABS: 0 10*3/uL (ref 0.0–0.1)
BASOS PCT: 0 %
EOS ABS: 0.1 10*3/uL (ref 0.0–0.7)
Eosinophils Relative: 2 %
HEMATOCRIT: 33.8 % — AB (ref 39.0–52.0)
HEMOGLOBIN: 10 g/dL — AB (ref 13.0–17.0)
Lymphocytes Relative: 19 %
Lymphs Abs: 0.9 10*3/uL (ref 0.7–4.0)
MCH: 24.9 pg — ABNORMAL LOW (ref 26.0–34.0)
MCHC: 29.6 g/dL — ABNORMAL LOW (ref 30.0–36.0)
MCV: 84.3 fL (ref 78.0–100.0)
Monocytes Absolute: 0.2 10*3/uL (ref 0.1–1.0)
Monocytes Relative: 5 %
NEUTROS ABS: 3.3 10*3/uL (ref 1.7–7.7)
NEUTROS PCT: 74 %
PLATELETS: 220 10*3/uL (ref 150–400)
RBC: 4.01 MIL/uL — AB (ref 4.22–5.81)
RDW: 16 % — ABNORMAL HIGH (ref 11.5–15.5)
WBC: 4.4 10*3/uL (ref 4.0–10.5)

## 2016-03-24 LAB — COMPREHENSIVE METABOLIC PANEL
ALBUMIN: 3.4 g/dL — AB (ref 3.5–5.0)
ALT: 19 U/L (ref 17–63)
AST: 25 U/L (ref 15–41)
Alkaline Phosphatase: 64 U/L (ref 38–126)
Anion gap: 6 (ref 5–15)
BUN: 18 mg/dL (ref 6–20)
CHLORIDE: 107 mmol/L (ref 101–111)
CO2: 26 mmol/L (ref 22–32)
CREATININE: 1.11 mg/dL (ref 0.61–1.24)
Calcium: 8.8 mg/dL — ABNORMAL LOW (ref 8.9–10.3)
GFR calc Af Amer: >60 mL/min (ref >60)
GFR calc non Af Amer: >60 mL/min (ref >60)
GLUCOSE: 123 mg/dL — AB (ref 65–99)
POTASSIUM: 3.2 mmol/L — AB (ref 3.5–5.1)
SODIUM: 139 mmol/L (ref 135–145)
Total Bilirubin: 1.7 mg/dL — ABNORMAL HIGH (ref 0.3–1.2)
Total Protein: 6.2 g/dL — ABNORMAL LOW (ref 6.5–8.1)

## 2016-03-24 LAB — I-STAT TROPONIN, ED: Troponin i, poc: 0.01 ng/mL (ref 0.00–0.08)

## 2016-03-24 LAB — BRAIN NATRIURETIC PEPTIDE: B Natriuretic Peptide: 563.5 pg/mL — ABNORMAL HIGH (ref 0.0–100.0)

## 2016-03-24 MED ORDER — FUROSEMIDE 10 MG/ML IJ SOLN
60.0000 mg | Freq: Once | INTRAMUSCULAR | Status: AC
  Administered 2016-03-24: 60 mg via INTRAVENOUS
  Filled 2016-03-24: qty 6

## 2016-03-24 NOTE — ED Triage Notes (Addendum)
Pt c/o swelling in bil feet and legs.  St's he is taking Lasix but continues to swell.  No resp distress noted at this time.  St's he is short of breath with ambulation

## 2016-03-25 MED ORDER — METOPROLOL TARTRATE 25 MG PO TABS
25.0000 mg | ORAL_TABLET | Freq: Once | ORAL | Status: AC
  Administered 2016-03-25: 25 mg via ORAL
  Filled 2016-03-25: qty 1

## 2016-03-25 MED ORDER — FUROSEMIDE 80 MG PO TABS: 80.0000 mg | ORAL_TABLET | Freq: Two times a day (BID) | ORAL | 0 refills | Status: DC

## 2016-03-25 NOTE — ED Provider Notes (Signed)
[facility]-EMERGENCY DEPT Provider Note   CSN: [id number] Arrival date & time: 03/24/16  1924     History   Chief Complaint Chief Complaint  Patient presents with  . Leg Swelling    HPI   [name] is an 50 y.o. male with history of CHF, afib (on Xarelto), CAD, medication noncompliance who presents to the ER for evaluation of lower extremity edema and shortness of breath. He states his legs are getting much more swollen than baseline. He states the only thing that works for him is name-brand lasix 60 or 80 mg twice daily. He states that nobody will give him a prescription for "the real lasix." He states that a doctor in [place] told him to try Bumex for the past couple of days. Pt states he has been taking Bumex but "it's not working." He states now his legs are swollen and he feels short of breath when he walks. He denies chest pain.  Notably pt has had 25 ED visits in the past 6 months for similar presentation. He actually went to [facility] earlier today and left without being seen. In the [facility] system he was seen as recently as 9/30 at [facility]. He frequently has workup that is at baseline. He has a documented history of mediation noncompliance. He has been discharged from several medical practices. He last saw cardiology in June of this year and it was noted that he has had recurrent and persistent medication noncompliance resulting in continued health deterioration. Pt admits to me that he frequently does not take his Xarelto. He also admits that he eats whatever he wants.   Past Medical History:  Diagnosis Date  . Allergic rhinitis   . Asthma   . Atrial fibrillation (HCC)   . CHF (congestive heart failure) (HCC)   . Essential hypertension   . Gastroesophageal reflux disease   . Heart attack   . History of cardiac catheterization    Normal coronaries December 2016  . Noncompliance    Major problem leading to declining health and recurrent hospitalization  . Nonischemic  cardiomyopathy (HCC)    LVEF 20-25%  . OSA (obstructive sleep apnea)   . Osteoarthritis   . Peptic ulcer disease     Patient Active Problem List   Diagnosis Date Noted  . Acute on chronic congestive heart failure (HCC)   . Coronary arteries, normal Dec 2016 01/23/2016  . Chronic anticoagulation-Xarelto 01/23/2016  . Chronic renal insufficiency, stage III (moderate) 01/23/2016  . NSVT (nonsustained ventricular tachycardia) (HCC) 01/23/2016  . CHF (congestive heart failure) (HCC) 01/20/2016  . CHF exacerbation (HCC) 01/20/2016  . Acute systolic CHF (congestive heart failure) (HCC) 12/21/2015  . Acute on chronic combined systolic and diastolic CHF (congestive heart failure) (HCC) 12/21/2015  . Elevated troponin 12/21/2015  . Congestive heart failure (CHF) (HCC) 12/03/2015  . Acute on chronic combined systolic (congestive) and diastolic (congestive) heart failure   . Nonischemic cardiomyopathy (HCC)   . Morbid obesity due to excess calories (HCC)   . Noncompliance with diet and medication regimen 12/02/2015  . Acute on chronic systolic congestive heart failure (HCC)   . Acute diastolic CHF (congestive heart failure) (HCC) 11/18/2015  . Acute renal failure superimposed on stage 3 chronic kidney disease (HCC) 10/28/2015  . Acute on chronic combined systolic and diastolic heart failure (HCC) 10/08/2015  . OSA (obstructive sleep apnea) 09/20/2015  . Pulmonary hypertension 09/19/2015  . Leukopenia 09/19/2015  . Normocytic anemia 09/19/2015  . Chronic atrial fibrillation (HCC)   .  Dyspnea on exertion 05/28/2015  . Acute respiratory failure with hypoxia (HCC) 04/01/2015  . Pneumonia 04/01/2015  . Hypokalemia 04/01/2015  . Obesity hypoventilation syndrome (HCC) 08/08/2014  . GERD (gastroesophageal reflux disease) 08/08/2014  . Noncompliance with medication regimen 06/28/2014  . Atrial fibrillation with rapid ventricular response (HCC) 06/28/2014  . Edema of right lower extremity  06/21/2014  . Fecal urgency 06/21/2014  . Asthma 02/11/2009  . Hyperlipidemia 07/12/2008  . OBESITY, MORBID 07/12/2008  . Anxiety state 07/12/2008  . DEPRESSION 07/12/2008  . Essential hypertension 07/12/2008  . ALLERGIC RHINITIS 07/12/2008  . GERD 07/12/2008  . Peptic ulcer 07/12/2008  . OSTEOARTHRITIS 07/12/2008    Past Surgical History:  Procedure Laterality Date  . CARDIAC CATHETERIZATION N/A 06/14/2015   Procedure: Right/Left Heart Cath and Coronary Angiography;  Surgeon: Runell Gess, MD;  Location: [facility] INVASIVE CV LAB;  Service: Cardiovascular;  Laterality: N/A;       Home Medications    Prior to Admission medications   Medication Sig Start Date End Date Taking? Authorizing Provider  atorvastatin (LIPITOR) 80 MG tablet Take 80 mg by mouth daily.   Yes Historical Provider, MD  beclomethasone (QVAR) 80 MCG/ACT inhaler Inhale 2 puffs into the lungs daily as needed (shortness of breath). 11/21/15  Yes Erick Blinks, MD  furosemide (LASIX) 80 MG tablet Take 1 tablet (80 mg total) by mouth 2 (two) times daily. 12/02/15  Yes Jodelle Gross, NP  lisinopril (PRINIVIL,ZESTRIL) 5 MG tablet Take 1 tablet (5 mg total) by mouth daily. 01/17/16  Yes Glynn Octave, MD  metoprolol succinate (TOPROL-XL) 25 MG 24 hr tablet Take 25 mg by mouth 3 (three) times daily.   Yes Historical Provider, MD  potassium chloride SA (K-DUR,KLOR-CON) 20 MEQ tablet Take 2 tablets 2 times daily for 7 days and then 1 tablet 2 times daily thereafter. Patient taking differently: Take 20 mEq by mouth 2 (two) times daily.  01/09/16  Yes Elliot Cousin, MD  rivaroxaban (XARELTO) 20 MG TABS tablet Take 1 tablet (20 mg total) by mouth every evening. Patient taking differently: Take 20 mg by mouth at bedtime.  11/21/15  Yes Erick Blinks, MD  metoprolol succinate (TOPROL-XL) 100 MG 24 hr tablet Take 1 tablet (100 mg total) by mouth 2 (two) times daily. Take with or immediately following a meal. Patient not taking:  Reported on 03/24/2016 01/17/16   Glynn Octave, MD    Family History Family History  Problem Relation Age of Onset  . Stroke Father   . Heart attack Father   . Aneurysm Mother     Cerebral aneurysm  . Hypertension Sister   . Colon cancer Neg Hx   . Inflammatory bowel disease Neg Hx   . Liver disease Neg Hx     Social History Social History  Substance Use Topics  . Smoking status: Former Smoker    Packs/day: 0.50    Years: 20.00    Types: Cigarettes    Start date: 04/26/1988    Quit date: 06/23/2007  . Smokeless tobacco: Never Used     Comment: 1 ppd former smoker  . Alcohol use No     Comment: No etoh since 2009     Allergies   Bee venom; Lipitor [atorvastatin]; Aspirin; Banana; Diltiazem; Hydralazine; Orange fruit [citrus]; Strawberry extract; and Torsemide   Review of Systems Review of Systems 10 Systems reviewed and are negative for acute change except as noted in the HPI.   Physical Exam Updated Vital Signs BP (!) 170/132  Pulse 60   Temp 98.1 F (36.7 C) (Oral)   Resp 21   Ht 6' (1.829 m)   Wt 136.1 kg   SpO2 100%   BMI 40.69 kg/m   Physical Exam  Constitutional: He is oriented to person, place, and time.  HENT:  Right Ear: External ear normal.  Left Ear: External ear normal.  Nose: Nose normal.  Mouth/Throat: Oropharynx is clear and moist. No oropharyngeal exudate.  Eyes: Conjunctivae are normal.  Neck: Neck supple.  Cardiovascular: Normal heart sounds and intact distal pulses.   Irregularly irregular Rate 90s-100s  Pulmonary/Chest: Effort normal.  Diminished breath sounds throughout Breathing comfortably and speaking in full sentences  Abdominal: Soft. Bowel sounds are normal. He exhibits no distension. There is no tenderness. There is no rebound and no guarding.  Musculoskeletal:  4+ pitting edema through the knee in RLE, 3+ pitting edema in LLE. Asymmetry is chronic.  Lymphadenopathy:    He has no cervical adenopathy.  Neurological: He  is alert and oriented to person, place, and time. No cranial nerve deficit.  Skin: Skin is warm and dry.  Psychiatric: He has a normal mood and affect.  Nursing note and vitals reviewed.    ED Treatments / Results  Labs (all labs ordered are listed, but only abnormal results are displayed) Labs Reviewed  CBC WITH DIFFERENTIAL/PLATELET - Abnormal; Notable for the following:       Result Value   RBC 4.01 (*)    Hemoglobin 10.0 (*)    HCT 33.8 (*)    MCH 24.9 (*)    MCHC 29.6 (*)    RDW 16.0 (*)    All other components within normal limits  COMPREHENSIVE METABOLIC PANEL - Abnormal; Notable for the following:    Potassium 3.2 (*)    Glucose, Bld 123 (*)    Calcium 8.8 (*)    Total Protein 6.2 (*)    Albumin 3.4 (*)    Total Bilirubin 1.7 (*)    All other components within normal limits  BRAIN NATRIURETIC PEPTIDE - Abnormal; Notable for the following:    B Natriuretic Peptide 563.5 (*)    All other components within normal limits  I-STAT TROPOININ, ED    EKG  EKG Interpretation None       Radiology Dg Chest 2 View  Result Date: 03/24/2016 CLINICAL DATA:  Mid chest pressure with shortness of breath. Bilateral leg swelling. EXAM: CHEST  2 VIEW COMPARISON:  03/21/2016 and 03/20/2016 FINDINGS: Stable cardiomegaly. The trachea is midline. Central vascular structures are prominent for size but unchanged. Small amount of fluid or thickening along the fissures. No large pleural effusions. No focal airspace disease. IMPRESSION: Stable cardiomegaly with vascular congestion. No overt pulmonary edema. Findings are similar to the previous examination. Electronically Signed   By: Richarda Overlie M.D.   On: 03/24/2016 20:41    Procedures Procedures (including critical care time)  Medications Ordered in ED Medications  metoprolol tartrate (LOPRESSOR) tablet 25 mg (not administered)  furosemide (LASIX) injection 60 mg (60 mg Intravenous Given 03/24/16 2335)     Initial Impression /  Assessment and Plan / ED Course  I have reviewed the triage vital signs and the nursing notes.  Pertinent labs & imaging results that were available during my care of the patient were reviewed by me and considered in my medical decision making (see chart for details).  Clinical Course   States bumex not working. States lasix works well but it has to be name  brand. 60mg  BID. Feeling improved after dose of IV lasix in the ED. Labs at baseline. Ambulating without hypoxia. No tachypnea or increased WOB. Long history of medication noncompliance and I am somewhat doubtful that pt is taking his medications as prescribed. Will give rx for lasix and instructed close PCP and cards f/u. Pt has not taken his evening BP meds today. Typically takes metoprolol 25 mg. Will give dose prior to discharge.  Final Clinical Impressions(s) / ED Diagnoses   Final diagnoses:  Peripheral edema  Chronic congestive heart failure, unspecified congestive heart failure type (HCC)    New Prescriptions New Prescriptions   FUROSEMIDE (LASIX) 80 MG TABLET    Take 1 tablet (80 mg total) by mouth 2 (two) times daily.     Carlene CoriaSerena Y Makari Portman, PA-C 03/25/16 [id number]    Dione Boozeavid Glick, MD 03/25/16 [phone]

## 2016-03-25 NOTE — Discharge Instructions (Signed)
Take all of your home medication as prescribed. I have given you a prescription for Lasix and requested the name brand. Follow up with your cardiologist and primary care provider as soon as possible.

## 2016-03-28 ENCOUNTER — Emergency Department (HOSPITAL_COMMUNITY)
Admission: EM | Admit: 2016-03-28 | Discharge: 2016-03-28 | Disposition: A | Discharge status: Home / Self Care | Payer: Medicare Other | Attending: Emergency Medicine | Admitting: Emergency Medicine

## 2016-03-28 ENCOUNTER — Encounter (HOSPITAL_COMMUNITY): Payer: Self-pay | Admitting: Emergency Medicine

## 2016-03-28 ENCOUNTER — Emergency Department (HOSPITAL_COMMUNITY): Payer: Medicare Other

## 2016-03-28 DIAGNOSIS — I5021 Acute systolic (congestive) heart failure: Secondary | ICD-10-CM | POA: Diagnosis not present

## 2016-03-28 DIAGNOSIS — R0602 Shortness of breath: Secondary | ICD-10-CM | POA: Diagnosis not present

## 2016-03-28 DIAGNOSIS — M7989 Other specified soft tissue disorders: Secondary | ICD-10-CM | POA: Diagnosis present

## 2016-03-28 DIAGNOSIS — J45909 Unspecified asthma, uncomplicated: Secondary | ICD-10-CM | POA: Diagnosis not present

## 2016-03-28 DIAGNOSIS — R600 Localized edema: Secondary | ICD-10-CM | POA: Diagnosis not present

## 2016-03-28 DIAGNOSIS — Z79899 Other long term (current) drug therapy: Secondary | ICD-10-CM | POA: Diagnosis not present

## 2016-03-28 DIAGNOSIS — Z87891 Personal history of nicotine dependence: Secondary | ICD-10-CM | POA: Insufficient documentation

## 2016-03-28 DIAGNOSIS — I110 Hypertensive heart disease with heart failure: Secondary | ICD-10-CM | POA: Insufficient documentation

## 2016-03-28 LAB — BASIC METABOLIC PANEL
ANION GAP: 6 (ref 5–15)
BUN: 19 mg/dL (ref 6–20)
CHLORIDE: 105 mmol/L (ref 101–111)
CO2: 29 mmol/L (ref 22–32)
CREATININE: 1.12 mg/dL (ref 0.61–1.24)
Calcium: 8.9 mg/dL (ref 8.9–10.3)
GFR calc Af Amer: >60 mL/min (ref >60)
GFR calc non Af Amer: >60 mL/min (ref >60)
Glucose, Bld: 96 mg/dL (ref 65–99)
POTASSIUM: 3.3 mmol/L — AB (ref 3.5–5.1)
SODIUM: 140 mmol/L (ref 135–145)

## 2016-03-28 LAB — CBC
HEMATOCRIT: 33.9 % — AB (ref 39.0–52.0)
HEMOGLOBIN: 10.4 g/dL — AB (ref 13.0–17.0)
MCH: 25.6 pg — AB (ref 26.0–34.0)
MCHC: 30.7 g/dL (ref 30.0–36.0)
MCV: 83.5 fL (ref 78.0–100.0)
Platelets: 234 10*3/uL (ref 150–400)
RBC: 4.06 MIL/uL — AB (ref 4.22–5.81)
RDW: 16.1 % — ABNORMAL HIGH (ref 11.5–15.5)
WBC: 3.7 10*3/uL — ABNORMAL LOW (ref 4.0–10.5)

## 2016-03-28 LAB — BRAIN NATRIURETIC PEPTIDE: B Natriuretic Peptide: 473 pg/mL — ABNORMAL HIGH (ref 0.0–100.0)

## 2016-03-28 LAB — TROPONIN I: Troponin I: 0.03 ng/mL (ref <0.03)

## 2016-03-28 MED ORDER — POTASSIUM CHLORIDE CRYS ER 20 MEQ PO TBCR
40.0000 meq | EXTENDED_RELEASE_TABLET | Freq: Once | ORAL | Status: AC
  Administered 2016-03-28: 40 meq via ORAL
  Filled 2016-03-28: qty 2

## 2016-03-28 MED ORDER — MAGNESIUM OXIDE 400 (241.3 MG) MG PO TABS
800.0000 mg | ORAL_TABLET | Freq: Once | ORAL | Status: AC
  Administered 2016-03-28: 800 mg via ORAL
  Filled 2016-03-28: qty 2

## 2016-03-28 MED ORDER — MAGNESIUM OXIDE 400 (241.3 MG) MG PO TABS
ORAL_TABLET | ORAL | Status: AC
  Filled 2016-03-28: qty 2

## 2016-03-28 MED ORDER — FUROSEMIDE 10 MG/ML IJ SOLN
40.0000 mg | Freq: Once | INTRAMUSCULAR | Status: AC
  Administered 2016-03-28: 40 mg via INTRAVENOUS
  Filled 2016-03-28: qty 4

## 2016-03-28 MED ORDER — METOPROLOL TARTRATE 5 MG/5ML IV SOLN
10.0000 mg | Freq: Once | INTRAVENOUS | Status: AC
  Administered 2016-03-28: 10 mg via INTRAVENOUS
  Filled 2016-03-28: qty 10

## 2016-03-28 MED ORDER — METOPROLOL TARTRATE 5 MG/5ML IV SOLN
INTRAVENOUS | Status: AC
  Filled 2016-03-28: qty 5

## 2016-03-28 MED ORDER — POTASSIUM CHLORIDE 10 MEQ/100ML IV SOLN
10.0000 meq | Freq: Once | INTRAVENOUS | Status: AC
  Administered 2016-03-28: 10 meq via INTRAVENOUS
  Filled 2016-03-28: qty 100

## 2016-03-28 MED ORDER — NITROGLYCERIN 0.4 MG SL SUBL: 0.4000 mg | SUBLINGUAL_TABLET | SUBLINGUAL | Status: DC | PRN

## 2016-03-28 NOTE — Discharge Instructions (Signed)
Talk to your doctor on Monday, return for sudden worsening sob, leg swelling.

## 2016-03-28 NOTE — ED Triage Notes (Signed)
Patient c/o swelling to lower extremities, abd, and hands. Patient has hx of CHF. Per patient told to take Lasix 40mg  BID in which he has with no relief. Patient does reports shortness of breath and cough with swelling.

## 2016-03-28 NOTE — ED Notes (Signed)
CRITICAL VALUE ALERT  Critical value received:  Trop 0.03  Date of notification:  03/29/2015  Time of notification:  2000  Critical value read back:Yes.    Nurse who received alert:  Malena Catholic   MD notified (1st page):  Adela Lank  Time of first page:  2001    Time MD responded:  2001

## 2016-03-28 NOTE — ED Provider Notes (Signed)
[facility]-EMERGENCY DEPT Provider Note   CSN: [id number] Arrival date & time: 03/28/16  1759     History   Chief Complaint Chief Complaint  Patient presents with  . Leg Swelling    HPI [name] is a 50 y.o. male.  50 yo M with a chief complaint of worsening fluid overload. No known or the course of a week or so. Has increased his Lasix with no significant improvement. Having mild worsening shortness of breath on exertion for the past few days. Denies decreased urine output. Having mild cough and rhinorrhea. Denies fevers or chills. Denies chest pain.   The history is provided by the patient.  Shortness of Breath  This is a new problem. The average episode lasts 1 week. The problem occurs continuously.The current episode started more than 1 week ago. The problem has not changed since onset.Associated symptoms include leg swelling. Pertinent negatives include no fever, no headaches, no chest pain, no vomiting, no abdominal pain and no rash. He has tried nothing for the symptoms. The treatment provided no relief.    Past Medical History:  Diagnosis Date  . Allergic rhinitis   . Asthma   . Atrial fibrillation (HCC)   . CHF (congestive heart failure) (HCC)   . Essential hypertension   . Gastroesophageal reflux disease   . Heart attack   . History of cardiac catheterization    Normal coronaries December 2016  . Noncompliance    Major problem leading to declining health and recurrent hospitalization  . Nonischemic cardiomyopathy (HCC)    LVEF 20-25%  . OSA (obstructive sleep apnea)   . Osteoarthritis   . Peptic ulcer disease     Patient Active Problem List   Diagnosis Date Noted  . Acute on chronic congestive heart failure (HCC)   . Coronary arteries, normal Dec 2016 01/23/2016  . Chronic anticoagulation-Xarelto 01/23/2016  . Chronic renal insufficiency, stage III (moderate) 01/23/2016  . NSVT (nonsustained ventricular tachycardia) (HCC) 01/23/2016  . CHF (congestive  heart failure) (HCC) 01/20/2016  . CHF exacerbation (HCC) 01/20/2016  . Acute systolic CHF (congestive heart failure) (HCC) 12/21/2015  . Acute on chronic combined systolic and diastolic CHF (congestive heart failure) (HCC) 12/21/2015  . Elevated troponin 12/21/2015  . Congestive heart failure (CHF) (HCC) 12/03/2015  . Acute on chronic combined systolic (congestive) and diastolic (congestive) heart failure   . Nonischemic cardiomyopathy (HCC)   . Morbid obesity due to excess calories (HCC)   . Noncompliance with diet and medication regimen 12/02/2015  . Acute on chronic systolic congestive heart failure (HCC)   . Acute diastolic CHF (congestive heart failure) (HCC) 11/18/2015  . Acute renal failure superimposed on stage 3 chronic kidney disease (HCC) 10/28/2015  . Acute on chronic combined systolic and diastolic heart failure (HCC) 10/08/2015  . OSA (obstructive sleep apnea) 09/20/2015  . Pulmonary hypertension 09/19/2015  . Leukopenia 09/19/2015  . Normocytic anemia 09/19/2015  . Chronic atrial fibrillation (HCC)   . Dyspnea on exertion 05/28/2015  . Acute respiratory failure with hypoxia (HCC) 04/01/2015  . Pneumonia 04/01/2015  . Hypokalemia 04/01/2015  . Obesity hypoventilation syndrome (HCC) 08/08/2014  . GERD (gastroesophageal reflux disease) 08/08/2014  . Noncompliance with medication regimen 06/28/2014  . Atrial fibrillation with rapid ventricular response (HCC) 06/28/2014  . Edema of right lower extremity 06/21/2014  . Fecal urgency 06/21/2014  . Asthma 02/11/2009  . Hyperlipidemia 07/12/2008  . OBESITY, MORBID 07/12/2008  . Anxiety state 07/12/2008  . DEPRESSION 07/12/2008  . Essential hypertension 07/12/2008  .  ALLERGIC RHINITIS 07/12/2008  . GERD 07/12/2008  . Peptic ulcer 07/12/2008  . OSTEOARTHRITIS 07/12/2008    Past Surgical History:  Procedure Laterality Date  . CARDIAC CATHETERIZATION N/A 06/14/2015   Procedure: Right/Left Heart Cath and Coronary  Angiography;  Surgeon: Runell Gess, MD;  Location: [facility] INVASIVE CV LAB;  Service: Cardiovascular;  Laterality: N/A;       Home Medications    Prior to Admission medications   Medication Sig Start Date End Date Taking? Authorizing Provider  atorvastatin (LIPITOR) 80 MG tablet Take 80 mg by mouth at bedtime.    Yes Historical Provider, MD  beclomethasone (QVAR) 80 MCG/ACT inhaler Inhale 2 puffs into the lungs 2 (two) times daily as needed (for shortness of breath).   Yes Historical Provider, MD  furosemide (LASIX) 80 MG tablet Take 1 tablet (80 mg total) by mouth 2 (two) times daily. 03/25/16  Yes Ace Gins Sam, PA-C  lisinopril (PRINIVIL,ZESTRIL) 5 MG tablet Take 1 tablet (5 mg total) by mouth daily. 01/17/16  Yes Glynn Octave, MD  metoprolol succinate (TOPROL-XL) 25 MG 24 hr tablet Take 25 mg by mouth 3 (three) times daily.   Yes Historical Provider, MD  potassium chloride SA (K-DUR,KLOR-CON) 20 MEQ tablet Take 40 mEq by mouth every other day.   Yes Historical Provider, MD  rivaroxaban (XARELTO) 20 MG TABS tablet Take 20 mg by mouth daily with breakfast.   Yes Historical Provider, MD    Family History Family History  Problem Relation Age of Onset  . Stroke Father   . Heart attack Father   . Aneurysm Mother     Cerebral aneurysm  . Hypertension Sister   . Colon cancer Neg Hx   . Inflammatory bowel disease Neg Hx   . Liver disease Neg Hx     Social History Social History  Substance Use Topics  . Smoking status: Former Smoker    Packs/day: 0.50    Years: 20.00    Types: Cigarettes    Start date: 04/26/1988    Quit date: 06/23/2007  . Smokeless tobacco: Never Used     Comment: 1 ppd former smoker  . Alcohol use No     Comment: No etoh since 2009     Allergies   Bee venom; Food; Lipitor [atorvastatin]; Aspirin; Banana; Orange juice [orange oil]; Torsemide; Diltiazem; and Hydralazine   Review of Systems Review of Systems  Constitutional: Negative for chills and fever.   HENT: Negative for congestion and facial swelling.   Eyes: Negative for discharge and visual disturbance.  Respiratory: Positive for shortness of breath.   Cardiovascular: Positive for leg swelling. Negative for chest pain and palpitations.  Gastrointestinal: Negative for abdominal pain, diarrhea and vomiting.  Musculoskeletal: Negative for arthralgias and myalgias.  Skin: Negative for color change and rash.  Neurological: Negative for tremors, syncope and headaches.  Psychiatric/Behavioral: Negative for confusion and dysphoric mood.     Physical Exam Updated Vital Signs BP 125/99   Pulse 61   Temp 99 F (37.2 C) (Temporal)   Resp 21   Ht 6' (1.829 m)   Wt 300 lb (136.1 kg)   SpO2 98%   BMI 40.69 kg/m   Physical Exam  Constitutional: He is oriented to person, place, and time. He appears well-developed and well-nourished.  HENT:  Head: Normocephalic and atraumatic.  JVD to the jaw.    Eyes: EOM are normal. Pupils are equal, round, and reactive to light.  Neck: Normal range of motion. Neck supple. No  JVD present.  Cardiovascular: Normal rate and regular rhythm.  Exam reveals no gallop and no friction rub.   No murmur heard. Pulmonary/Chest: No respiratory distress. He has no wheezes.  Abdominal: He exhibits no distension. There is no rebound and no guarding.  Musculoskeletal: Normal range of motion. He exhibits edema (4+ to BLE.  R slightly larger than left, at baseline per patient).  Neurological: He is alert and oriented to person, place, and time.  Skin: No rash noted. No pallor.  Psychiatric: He has a normal mood and affect. His behavior is normal.  Nursing note and vitals reviewed.    ED Treatments / Results  Labs (all labs ordered are listed, but only abnormal results are displayed) Labs Reviewed  BASIC METABOLIC PANEL - Abnormal; Notable for the following:       Result Value   Potassium 3.3 (*)    All other components within normal limits  CBC - Abnormal;  Notable for the following:    WBC 3.7 (*)    RBC 4.06 (*)    Hemoglobin 10.4 (*)    HCT 33.9 (*)    MCH 25.6 (*)    RDW 16.1 (*)    All other components within normal limits  TROPONIN I - Abnormal; Notable for the following:    Troponin I 0.03 (*)    All other components within normal limits  BRAIN NATRIURETIC PEPTIDE - Abnormal; Notable for the following:    B Natriuretic Peptide 473.0 (*)    All other components within normal limits    EKG  EKG Interpretation  Date/Time:  Saturday March 28 2016 18:28:30 EDT Ventricular Rate:  114 PR Interval:    QRS Duration: 90 QT Interval:  358 QTC Calculation: 493 R Axis:   41 Text Interpretation:  Atrial fibrillation Nonspecific T wave abnormality Abnormal ECG No significant change since last tracing Confirmed by Jenniffer Vessels MD, DANIEL [phone]) on 03/28/2016 6:43:53 PM       Radiology Dg Chest 2 View  Result Date: 03/28/2016 CLINICAL DATA:  Swelling in hands and lower extremities. History of CHF. Shortness of breath. EXAM: CHEST  2 VIEW COMPARISON:  March 26, 2016 FINDINGS: Stable cardiomegaly. The hila and mediastinum are normal. No pneumothorax. No pulmonary nodules or masses. No focal infiltrates. Mild edema. IMPRESSION: Mild edema and cardiomegaly. Electronically Signed   By: Gerome Sam III M.D   On: 03/28/2016 19:05    Procedures Procedures (including critical care time)  Medications Ordered in ED Medications  nitroGLYCERIN (NITROSTAT) SL tablet 0.4 mg (not administered)  potassium chloride SA (K-DUR,KLOR-CON) CR tablet 40 mEq (40 mEq Oral Given 03/28/16 2053)  potassium chloride 10 mEq in 100 mL IVPB (0 mEq Intravenous Stopped 03/28/16 2202)  furosemide (LASIX) injection 40 mg (40 mg Intravenous Given 03/28/16 2053)  magnesium oxide (MAG-OX) tablet 800 mg (800 mg Oral Given 03/28/16 2140)  metoprolol (LOPRESSOR) injection 10 mg (10 mg Intravenous Given 03/28/16 2130)     Initial Impression / Assessment and Plan / ED Course    I have reviewed the triage vital signs and the nursing notes.  Pertinent labs & imaging results that were available during my care of the patient were reviewed by me and considered in my medical decision making (see chart for details).  Clinical Course    50 yo M with a cc of BLE.  Going on for past couple days.  Likely worsening fluid overload from CHF, will obtain labs, cxr, ekg.  The patient's labs appear to be  at baseline. He is given Lasix with some improvement. Transiently went into A. fib with RVR that was improved with IV metoprolol. DC home.  11:47 PM:  I have discussed the diagnosis/risks/treatment options with the patient and believe the pt to be eligible for discharge home to follow-up with PCP. We also discussed returning to the ED immediately if new or worsening sx occur. We discussed the sx which are most concerning (e.g., sudden worsening pain, fever, inability to tolerate by mouth) that necessitate immediate return. Medications administered to the patient during their visit and any new prescriptions provided to the patient are listed below.  Medications given during this visit Medications  nitroGLYCERIN (NITROSTAT) SL tablet 0.4 mg (not administered)  potassium chloride SA (K-DUR,KLOR-CON) CR tablet 40 mEq (40 mEq Oral Given 03/28/16 2053)  potassium chloride 10 mEq in 100 mL IVPB (0 mEq Intravenous Stopped 03/28/16 2202)  furosemide (LASIX) injection 40 mg (40 mg Intravenous Given 03/28/16 2053)  magnesium oxide (MAG-OX) tablet 800 mg (800 mg Oral Given 03/28/16 2140)  metoprolol (LOPRESSOR) injection 10 mg (10 mg Intravenous Given 03/28/16 2130)     The patient appears reasonably screen and/or stabilized for discharge and I doubt any other medical condition or other [facility] requiring further screening, evaluation, or treatment in the ED at this time prior to discharge.    Final Clinical Impressions(s) / ED Diagnoses   Final diagnoses:  Leg edema    New  Prescriptions Discharge Medication List as of 03/28/2016 10:50 PM       Melene Plan, DO 03/28/16 2347

## 2016-03-30 ENCOUNTER — Emergency Department (HOSPITAL_COMMUNITY): Payer: Medicare Other

## 2016-03-30 ENCOUNTER — Encounter (HOSPITAL_COMMUNITY): Payer: Self-pay

## 2016-03-30 ENCOUNTER — Other Ambulatory Visit: Payer: Self-pay

## 2016-03-30 ENCOUNTER — Emergency Department (HOSPITAL_COMMUNITY)
Admission: EM | Admit: 2016-03-30 | Discharge: 2016-03-30 | Disposition: A | Discharge status: Home / Self Care | Payer: Medicare Other | Attending: Emergency Medicine | Admitting: Emergency Medicine

## 2016-03-30 DIAGNOSIS — R0602 Shortness of breath: Secondary | ICD-10-CM | POA: Diagnosis present

## 2016-03-30 DIAGNOSIS — Z87891 Personal history of nicotine dependence: Secondary | ICD-10-CM | POA: Insufficient documentation

## 2016-03-30 DIAGNOSIS — I110 Hypertensive heart disease with heart failure: Secondary | ICD-10-CM | POA: Diagnosis not present

## 2016-03-30 DIAGNOSIS — I482 Chronic atrial fibrillation, unspecified: Secondary | ICD-10-CM

## 2016-03-30 DIAGNOSIS — I5043 Acute on chronic combined systolic (congestive) and diastolic (congestive) heart failure: Secondary | ICD-10-CM | POA: Insufficient documentation

## 2016-03-30 DIAGNOSIS — J45909 Unspecified asthma, uncomplicated: Secondary | ICD-10-CM | POA: Diagnosis not present

## 2016-03-30 DIAGNOSIS — Z79899 Other long term (current) drug therapy: Secondary | ICD-10-CM | POA: Insufficient documentation

## 2016-03-30 LAB — BRAIN NATRIURETIC PEPTIDE: B Natriuretic Peptide: 589 pg/mL — ABNORMAL HIGH (ref 0.0–100.0)

## 2016-03-30 LAB — CBC WITH DIFFERENTIAL/PLATELET
Basophils Absolute: 0 10*3/uL (ref 0.0–0.1)
Basophils Relative: 1 %
EOS ABS: 0.1 10*3/uL (ref 0.0–0.7)
EOS PCT: 4 %
HCT: 32.7 % — ABNORMAL LOW (ref 39.0–52.0)
Hemoglobin: 10.1 g/dL — ABNORMAL LOW (ref 13.0–17.0)
LYMPHS ABS: 0.9 10*3/uL (ref 0.7–4.0)
LYMPHS PCT: 24 %
MCH: 25.6 pg — AB (ref 26.0–34.0)
MCHC: 30.9 g/dL (ref 30.0–36.0)
MCV: 82.8 fL (ref 78.0–100.0)
MONO ABS: 0.3 10*3/uL (ref 0.1–1.0)
MONOS PCT: 8 %
Neutro Abs: 2.3 10*3/uL (ref 1.7–7.7)
Neutrophils Relative %: 63 %
PLATELETS: 233 10*3/uL (ref 150–400)
RBC: 3.95 MIL/uL — AB (ref 4.22–5.81)
RDW: 16.1 % — AB (ref 11.5–15.5)
WBC: 3.6 10*3/uL — ABNORMAL LOW (ref 4.0–10.5)

## 2016-03-30 LAB — BASIC METABOLIC PANEL
Anion gap: 5 (ref 5–15)
BUN: 18 mg/dL (ref 6–20)
CHLORIDE: 107 mmol/L (ref 101–111)
CO2: 28 mmol/L (ref 22–32)
CREATININE: 1.2 mg/dL (ref 0.61–1.24)
Calcium: 8.7 mg/dL — ABNORMAL LOW (ref 8.9–10.3)
GFR calc Af Amer: >60 mL/min (ref >60)
GFR calc non Af Amer: >60 mL/min (ref >60)
GLUCOSE: 88 mg/dL (ref 65–99)
POTASSIUM: 3.4 mmol/L — AB (ref 3.5–5.1)
Sodium: 140 mmol/L (ref 135–145)

## 2016-03-30 MED ORDER — FUROSEMIDE 10 MG/ML IJ SOLN
40.0000 mg | Freq: Once | INTRAMUSCULAR | Status: AC
  Administered 2016-03-30: 40 mg via INTRAVENOUS
  Filled 2016-03-30: qty 4

## 2016-03-30 MED ORDER — METOPROLOL TARTRATE 5 MG/5ML IV SOLN
5.0000 mg | Freq: Once | INTRAVENOUS | Status: AC
  Administered 2016-03-30: 5 mg via INTRAVENOUS
  Filled 2016-03-30: qty 5

## 2016-03-30 NOTE — ED Triage Notes (Signed)
Pt reports sob, swelling in lower legs x 2 days. Reports chest pain that started yesterday.  Pt has history of CHF>

## 2016-03-30 NOTE — ED Provider Notes (Signed)
[facility]-EMER[facility]ENCY DEPT Provider Note   CSN: [id number] Arrival date & time: 03/30/16  1121   History   Chief Complaint Chief Complaint  Patient presents with  . Shortness of Breath    HPI [name] is a 50 y.o. male.  PMH Afib, CHF 2/2 NICM (LVEF 20-25%), GERD, HTN, OSA, PUD who presents for ongoing swelling in legs and shortness of breath since last night. He also report "pain all over" and will not specify an area of worst pain. He reports 1-2 days of subjective palpitations. He reports missing some medications at home and states that he takes Lasix 40mg  BID but is prescribed 80mg  BID.    The history is provided by the patient. No language interpreter was used.    Past Medical History:  Diagnosis Date  . Allergic rhinitis   . Asthma   . Atrial fibrillation (HCC)   . CHF (congestive heart failure) (HCC)   . Essential hypertension   . Gastroesophageal reflux disease   . Heart attack   . History of cardiac catheterization    Normal coronaries December 2016  . Noncompliance    Major problem leading to declining health and recurrent hospitalization  . Nonischemic cardiomyopathy (HCC)    LVEF 20-25%  . OSA (obstructive sleep apnea)   . Osteoarthritis   . Peptic ulcer disease     Patient Active Problem List   Diagnosis Date Noted  . Acute on chronic congestive heart failure (HCC)   . Coronary arteries, normal Dec 2016 01/23/2016  . Chronic anticoagulation-Xarelto 01/23/2016  . Chronic renal insufficiency, stage III (moderate) 01/23/2016  . NSVT (nonsustained ventricular tachycardia) (HCC) 01/23/2016  . CHF (congestive heart failure) (HCC) 01/20/2016  . CHF exacerbation (HCC) 01/20/2016  . Acute systolic CHF (congestive heart failure) (HCC) 12/21/2015  . Acute on chronic combined systolic and diastolic CHF (congestive heart failure) (HCC) 12/21/2015  . Elevated troponin 12/21/2015  . Congestive heart failure (CHF) (HCC) 12/03/2015  . Acute on chronic combined  systolic (congestive) and diastolic (congestive) heart failure   . Nonischemic cardiomyopathy (HCC)   . Morbid obesity due to excess calories (HCC)   . Noncompliance with diet and medication regimen 12/02/2015  . Acute on chronic systolic congestive heart failure (HCC)   . Acute diastolic CHF (congestive heart failure) (HCC) 11/18/2015  . Acute renal failure superimposed on stage 3 chronic kidney disease (HCC) 10/28/2015  . Acute on chronic combined systolic and diastolic heart failure (HCC) 10/08/2015  . OSA (obstructive sleep apnea) 09/20/2015  . Pulmonary hypertension 09/19/2015  . Leukopenia 09/19/2015  . Normocytic anemia 09/19/2015  . Chronic atrial fibrillation (HCC)   . Dyspnea on exertion 05/28/2015  . Acute respiratory failure with hypoxia (HCC) 04/01/2015  . Pneumonia 04/01/2015  . Hypokalemia 04/01/2015  . Obesity hypoventilation syndrome (HCC) 08/08/2014  . GERD (gastroesophageal reflux disease) 08/08/2014  . Noncompliance with medication regimen 06/28/2014  . Atrial fibrillation with rapid ventricular response (HCC) 06/28/2014  . Edema of right lower extremity 06/21/2014  . Fecal urgency 06/21/2014  . Asthma 02/11/2009  . Hyperlipidemia 07/12/2008  . OBESITY, MORBID 07/12/2008  . Anxiety state 07/12/2008  . DEPRESSION 07/12/2008  . Essential hypertension 07/12/2008  . ALLERGIC RHINITIS 07/12/2008  . GERD 07/12/2008  . Peptic ulcer 07/12/2008  . OSTEOARTHRITIS 07/12/2008    Past Surgical History:  Procedure Laterality Date  . CARDIAC CATHETERIZATION N/A 06/14/2015   Procedure: Right/Left Heart Cath and Coronary Angiography;  Surgeon: Runell Gess, MD;  Location: [facility] INVASIVE CV LAB;  Service: Cardiovascular;  Laterality: N/A;       Home Medications    Prior to Admission medications   Medication Sig Start Date End Date Taking? Authorizing Provider  atorvastatin (LIPITOR) 80 MG tablet Take 80 mg by mouth at bedtime.    Yes Historical Provider, MD    beclomethasone (QVAR) 80 MCG/ACT inhaler Inhale 2 puffs into the lungs 2 (two) times daily as needed (for shortness of breath).   Yes Historical Provider, MD  furosemide (LASIX) 80 MG tablet Take 1 tablet (80 mg total) by mouth 2 (two) times daily. 03/25/16  Yes Ace Gins Sam, PA-C  lisinopril (PRINIVIL,ZESTRIL) 5 MG tablet Take 1 tablet (5 mg total) by mouth daily. 01/17/16  Yes Glynn Octave, MD  metoprolol succinate (TOPROL-XL) 25 MG 24 hr tablet Take 25 mg by mouth 3 (three) times daily.   Yes Historical Provider, MD  potassium chloride SA (K-DUR,KLOR-CON) 20 MEQ tablet Take 40 mEq by mouth daily.    Yes Historical Provider, MD  rivaroxaban (XARELTO) 20 MG TABS tablet Take 20 mg by mouth daily with breakfast.   Yes Historical Provider, MD    Family History Family History  Problem Relation Age of Onset  . Stroke Father   . Heart attack Father   . Aneurysm Mother     Cerebral aneurysm  . Hypertension Sister   . Colon cancer Neg Hx   . Inflammatory bowel disease Neg Hx   . Liver disease Neg Hx     Social History Social History  Substance Use Topics  . Smoking status: Former Smoker    Packs/day: 0.50    Years: 20.00    Types: Cigarettes    Start date: 04/26/1988    Quit date: 06/23/2007  . Smokeless tobacco: Never Used     Comment: 1 ppd former smoker  . Alcohol use No     Comment: No etoh since 2009     Allergies   Bee venom; Food; Lipitor [atorvastatin]; Aspirin; Banana; Orange juice [orange oil]; Torsemide; Diltiazem; and Hydralazine   Review of Systems Review of Systems  Constitutional: Negative for chills and fever.  Respiratory: Positive for chest tightness and shortness of breath. Negative for cough and wheezing.   Cardiovascular: Positive for palpitations and leg swelling. Negative for chest pain.  Gastrointestinal: Negative for abdominal pain, constipation, diarrhea, nausea and vomiting.  All other systems reviewed and are negative.    Physical Exam Updated  Vital Signs BP (!) 146/118 (BP Location: Left Arm)   Pulse 95   Temp 98.1 F (36.7 C) (Oral)   Resp 18   Ht 6' (1.829 m)   Wt 136.1 kg   SpO2 100%   BMI 40.69 kg/m   Physical Exam  Constitutional: He is oriented to person, place, and time. He appears well-developed and well-nourished. No distress.  Intermittently dozing, wearing sunglasses  HENT:  Head: Normocephalic and atraumatic.  Eyes: EOM are normal. Pupils are equal, round, and reactive to light.  Neck: Normal range of motion. Neck supple.  Cardiovascular: Normal rate, regular rhythm and intact distal pulses.  Exam reveals no gallop and no friction rub.   Murmur (soft systolic murmur) heard. Pulmonary/Chest: Effort normal. No respiratory distress. He has no wheezes. He has rales (crackles to mid lungs bilaterally). He exhibits no tenderness.  Abdominal: Soft. Bowel sounds are normal. He exhibits distension. He exhibits no mass. There is no tenderness. There is no guarding.  Musculoskeletal: Normal range of motion. He exhibits edema (3+ pitting edema to  mid thigh bilaterally) and deformity (medial right ankle soft tissue mass). He exhibits no tenderness.  Neurological: He is alert and oriented to person, place, and time.  Skin: Skin is warm and dry. Capillary refill takes less than 2 seconds. No rash noted. He is not diaphoretic. No erythema.  Psychiatric: He has a normal mood and affect. Thought content normal.     ED Treatments / Results  Labs (all labs ordered are listed, but only abnormal results are displayed) Labs Reviewed  BRAIN NATRIURETIC PEPTIDE - Abnormal; Notable for the following:       Result Value   B Natriuretic Peptide 589.0 (*)    All other components within normal limits  BASIC METABOLIC PANEL - Abnormal; Notable for the following:    Potassium 3.4 (*)    Calcium 8.7 (*)    All other components within normal limits  CBC WITH DIFFERENTIAL/PLATELET - Abnormal; Notable for the following:    WBC 3.6 (*)     RBC 3.95 (*)    Hemoglobin 10.1 (*)    HCT 32.7 (*)    MCH 25.6 (*)    RDW 16.1 (*)    All other components within normal limits    EKG  EKG Interpretation  Date/Time:  Monday March 30 2016 11:30:41 EDT Ventricular Rate:  106 PR Interval:    QRS Duration: 97 QT Interval:  383 QTC Calculation: 509 R Axis:   73 Text Interpretation:  Atrial fibrillation Borderline repolarization abnormality Prolonged QT interval Confirmed by Jodi Mourning MD, JOSHUA [phone]) on 03/30/2016 11:33:43 AM       Radiology Dg Chest 2 View  Result Date: 03/30/2016 CLINICAL DATA:  Short of breath EXAM: CHEST  2 VIEW COMPARISON:  03/28/2016 FINDINGS: Cardiac enlargement. Pulmonary vascular congestion with interstitial edema unchanged from the prior study. No pleural effusion. IMPRESSION: Congestive heart failure with interstitial edema unchanged from 2 days ago. Electronically Signed   By: Marlan Palau M.D.   On: 03/30/2016 13:29    Procedures Procedures (including critical care time)  Medications Ordered in ED Medications  furosemide (LASIX) injection 40 mg (40 mg Intravenous Given 03/30/16 1229)  metoprolol (LOPRESSOR) injection 5 mg (5 mg Intravenous Given 03/30/16 1229)  furosemide (LASIX) injection 40 mg (40 mg Intravenous Given 03/30/16 1518)     Initial Impression / Assessment and Plan / ED Course  I have reviewed the triage vital signs and the nursing notes.  Pertinent labs & imaging results that were available during my care of the patient were reviewed by me and considered in my medical decision making (see chart for details).  Clinical Course   Patient is well-known to ED, frequent visits for SOB/edema and history of noncompliance and leaving AMA, recently discharged from cardiology clinic for similar behavior. Was taking half prescribed dose of Lasix at home. Has subjective shortness of breath, SpO2 100% on RA but does have crackles to mid lung bilaterally on exam and 3+ pitting edema to mid  thigh bilaterally. Also in A-fib with HR in 100s-110. Provided Lasix 40mg  IV x2 and Metoprolol 5mg  IV x1 with symptomatic relief and decent urine output. Patient discharged home, strongly reinforced need to visit PCP in near future and to take his Lasix and Metoprolol consistently and as prescribed.  Final Clinical Impressions(s) / ED Diagnoses   Final diagnoses:  Acute on chronic combined systolic and diastolic congestive heart failure (HCC)  Chronic atrial fibrillation [facility])    New Prescriptions Discharge Medication List as of 03/30/2016  3:34 PM  Althia FortsAdam Srihitha Tagliaferri, MD 03/31/16 1327    Blane OharaJoshua Zavitz, MD 03/31/16 [id number]

## 2016-03-30 NOTE — Discharge Instructions (Signed)
Mr. Lionetti - please take your furosemide (Lasix) 80 mg TWO TIMES DAILY - please take your metoprolol (Toprol-XL) 25 mg THREE TIMES DAILY  Please make an appointment to see your primary care doctor.

## 2016-03-31 ENCOUNTER — Encounter: Payer: Self-pay | Admitting: Orthopaedic Surgery

## 2016-04-04 ENCOUNTER — Encounter (HOSPITAL_COMMUNITY): Payer: Self-pay | Admitting: Emergency Medicine

## 2016-04-04 ENCOUNTER — Inpatient Hospital Stay (HOSPITAL_COMMUNITY)
Admission: EM | Admit: 2016-04-04 | Discharge: 2016-04-07 | DRG: 291 | Disposition: A | Discharge status: Home / Self Care | Payer: Medicare Other | Attending: Internal Medicine | Admitting: Internal Medicine

## 2016-04-04 ENCOUNTER — Emergency Department (HOSPITAL_COMMUNITY): Payer: Medicare Other

## 2016-04-04 DIAGNOSIS — Z91018 Allergy to other foods: Secondary | ICD-10-CM

## 2016-04-04 DIAGNOSIS — I5043 Acute on chronic combined systolic (congestive) and diastolic (congestive) heart failure: Secondary | ICD-10-CM | POA: Diagnosis not present

## 2016-04-04 DIAGNOSIS — I110 Hypertensive heart disease with heart failure: Principal | ICD-10-CM | POA: Diagnosis present

## 2016-04-04 DIAGNOSIS — Z8249 Family history of ischemic heart disease and other diseases of the circulatory system: Secondary | ICD-10-CM | POA: Diagnosis not present

## 2016-04-04 DIAGNOSIS — M1990 Unspecified osteoarthritis, unspecified site: Secondary | ICD-10-CM | POA: Diagnosis present

## 2016-04-04 DIAGNOSIS — Z8711 Personal history of peptic ulcer disease: Secondary | ICD-10-CM

## 2016-04-04 DIAGNOSIS — R0602 Shortness of breath: Secondary | ICD-10-CM

## 2016-04-04 DIAGNOSIS — I4891 Unspecified atrial fibrillation: Secondary | ICD-10-CM | POA: Diagnosis not present

## 2016-04-04 DIAGNOSIS — Z7901 Long term (current) use of anticoagulants: Secondary | ICD-10-CM

## 2016-04-04 DIAGNOSIS — J189 Pneumonia, unspecified organism: Secondary | ICD-10-CM | POA: Diagnosis present

## 2016-04-04 DIAGNOSIS — G4733 Obstructive sleep apnea (adult) (pediatric): Secondary | ICD-10-CM | POA: Diagnosis present

## 2016-04-04 DIAGNOSIS — R600 Localized edema: Secondary | ICD-10-CM | POA: Diagnosis present

## 2016-04-04 DIAGNOSIS — Z6841 Body Mass Index (BMI) 40.0 and over, adult: Secondary | ICD-10-CM

## 2016-04-04 DIAGNOSIS — Z87891 Personal history of nicotine dependence: Secondary | ICD-10-CM | POA: Diagnosis not present

## 2016-04-04 DIAGNOSIS — Z823 Family history of stroke: Secondary | ICD-10-CM | POA: Diagnosis not present

## 2016-04-04 DIAGNOSIS — Z91030 Bee allergy status: Secondary | ICD-10-CM | POA: Diagnosis not present

## 2016-04-04 DIAGNOSIS — Z886 Allergy status to analgesic agent status: Secondary | ICD-10-CM | POA: Diagnosis not present

## 2016-04-04 DIAGNOSIS — I428 Other cardiomyopathies: Secondary | ICD-10-CM | POA: Diagnosis present

## 2016-04-04 DIAGNOSIS — K219 Gastro-esophageal reflux disease without esophagitis: Secondary | ICD-10-CM | POA: Diagnosis present

## 2016-04-04 DIAGNOSIS — E876 Hypokalemia: Secondary | ICD-10-CM | POA: Diagnosis present

## 2016-04-04 DIAGNOSIS — J9601 Acute respiratory failure with hypoxia: Secondary | ICD-10-CM | POA: Diagnosis not present

## 2016-04-04 DIAGNOSIS — I252 Old myocardial infarction: Secondary | ICD-10-CM | POA: Diagnosis not present

## 2016-04-04 DIAGNOSIS — R7989 Other specified abnormal findings of blood chemistry: Secondary | ICD-10-CM

## 2016-04-04 DIAGNOSIS — R748 Abnormal levels of other serum enzymes: Secondary | ICD-10-CM

## 2016-04-04 DIAGNOSIS — R778 Other specified abnormalities of plasma proteins: Secondary | ICD-10-CM | POA: Diagnosis present

## 2016-04-04 DIAGNOSIS — R079 Chest pain, unspecified: Secondary | ICD-10-CM

## 2016-04-04 LAB — CBC
HCT: 31.4 % — ABNORMAL LOW (ref 39.0–52.0)
HEMOGLOBIN: 9.6 g/dL — AB (ref 13.0–17.0)
MCH: 25.3 pg — AB (ref 26.0–34.0)
MCHC: 30.6 g/dL (ref 30.0–36.0)
MCV: 82.8 fL (ref 78.0–100.0)
Platelets: 215 10*3/uL (ref 150–400)
RBC: 3.79 MIL/uL — AB (ref 4.22–5.81)
RDW: 16.1 % — ABNORMAL HIGH (ref 11.5–15.5)
WBC: 6.2 10*3/uL (ref 4.0–10.5)

## 2016-04-04 LAB — BASIC METABOLIC PANEL
ANION GAP: 6 (ref 5–15)
BUN: 18 mg/dL (ref 6–20)
CALCIUM: 8.6 mg/dL — AB (ref 8.9–10.3)
CHLORIDE: 106 mmol/L (ref 101–111)
CO2: 28 mmol/L (ref 22–32)
Creatinine, Ser: 1.17 mg/dL (ref 0.61–1.24)
GFR calc non Af Amer: >60 mL/min (ref >60)
GLUCOSE: 89 mg/dL (ref 65–99)
Potassium: 3.2 mmol/L — ABNORMAL LOW (ref 3.5–5.1)
Sodium: 140 mmol/L (ref 135–145)

## 2016-04-04 LAB — BRAIN NATRIURETIC PEPTIDE: B Natriuretic Peptide: 663 pg/mL — ABNORMAL HIGH (ref 0.0–100.0)

## 2016-04-04 LAB — BLOOD GAS, ARTERIAL
Acid-Base Excess: 0.8 mmol/L (ref 0.0–2.0)
BICARBONATE: 25.3 mmol/L (ref 20.0–28.0)
DRAWN BY: 277331
O2 CONTENT: 3 L/min
O2 SAT: 98.1 %
PATIENT TEMPERATURE: 37
PO2 ART: 110 mmHg — AB (ref 83.0–108.0)
pCO2 arterial: 35.7 mmHg (ref 32.0–48.0)
pH, Arterial: 7.449 (ref 7.350–7.450)

## 2016-04-04 LAB — TROPONIN I: TROPONIN I: 0.03 ng/mL — AB (ref <0.03)

## 2016-04-04 LAB — INFLUENZA PANEL BY PCR (TYPE A & B)
H1N1 flu by pcr: NOT DETECTED
Influenza A By PCR: NEGATIVE
Influenza B By PCR: NEGATIVE

## 2016-04-04 LAB — PROTIME-INR
INR: 1.22
PROTHROMBIN TIME: 15.5 s — AB (ref 11.4–15.2)

## 2016-04-04 LAB — TSH: TSH: 1.24 u[IU]/mL (ref 0.350–4.500)

## 2016-04-04 MED ORDER — METOPROLOL TARTRATE 5 MG/5ML IV SOLN
10.0000 mg | Freq: Four times a day (QID) | INTRAVENOUS | Status: DC | PRN
  Administered 2016-04-04: 10 mg via INTRAVENOUS
  Filled 2016-04-04: qty 10

## 2016-04-04 MED ORDER — ONDANSETRON HCL 4 MG PO TABS: 4.0000 mg | ORAL_TABLET | Freq: Four times a day (QID) | ORAL | Status: DC | PRN

## 2016-04-04 MED ORDER — RIVAROXABAN 20 MG PO TABS
20.0000 mg | ORAL_TABLET | Freq: Every day | ORAL | Status: DC
  Administered 2016-04-05 – 2016-04-07 (×3): 20 mg via ORAL
  Filled 2016-04-04 (×3): qty 1

## 2016-04-04 MED ORDER — DEXTROSE 5 % IV SOLN
500.0000 mg | INTRAVENOUS | Status: DC
  Administered 2016-04-04 – 2016-04-06 (×3): 500 mg via INTRAVENOUS
  Filled 2016-04-04 (×3): qty 500

## 2016-04-04 MED ORDER — METOPROLOL SUCCINATE ER 25 MG PO TB24
25.0000 mg | ORAL_TABLET | Freq: Three times a day (TID) | ORAL | Status: DC
  Administered 2016-04-04 (×2): 25 mg via ORAL
  Filled 2016-04-04 (×3): qty 1

## 2016-04-04 MED ORDER — DEXTROSE 5 % IV SOLN
1.0000 g | INTRAVENOUS | Status: DC
  Administered 2016-04-05 – 2016-04-06 (×2): 1 g via INTRAVENOUS
  Filled 2016-04-04 (×4): qty 10

## 2016-04-04 MED ORDER — ACETAMINOPHEN 650 MG RE SUPP: 650.0000 mg | Freq: Four times a day (QID) | RECTAL | Status: DC | PRN

## 2016-04-04 MED ORDER — ACETAMINOPHEN 325 MG PO TABS: 650.0000 mg | ORAL_TABLET | Freq: Four times a day (QID) | ORAL | Status: DC | PRN

## 2016-04-04 MED ORDER — FUROSEMIDE 10 MG/ML IJ SOLN
80.0000 mg | Freq: Two times a day (BID) | INTRAMUSCULAR | Status: DC
  Administered 2016-04-04 – 2016-04-07 (×6): 80 mg via INTRAVENOUS
  Filled 2016-04-04 (×6): qty 8

## 2016-04-04 MED ORDER — METOPROLOL TARTRATE 5 MG/5ML IV SOLN
10.0000 mg | Freq: Once | INTRAVENOUS | Status: AC
  Administered 2016-04-04: 10 mg via INTRAVENOUS
  Filled 2016-04-04: qty 10

## 2016-04-04 MED ORDER — ATORVASTATIN CALCIUM 40 MG PO TABS
80.0000 mg | ORAL_TABLET | Freq: Every day | ORAL | Status: DC
  Administered 2016-04-04 – 2016-04-06 (×3): 80 mg via ORAL
  Filled 2016-04-04 (×3): qty 2

## 2016-04-04 MED ORDER — POTASSIUM CHLORIDE 20 MEQ PO PACK
40.0000 meq | PACK | Freq: Once | ORAL | Status: AC
  Administered 2016-04-04: 40 meq via ORAL
  Filled 2016-04-04: qty 2

## 2016-04-04 MED ORDER — ALBUTEROL SULFATE (2.5 MG/3ML) 0.083% IN NEBU
5.0000 mg | INHALATION_SOLUTION | Freq: Once | RESPIRATORY_TRACT | Status: AC
  Administered 2016-04-04: 5 mg via RESPIRATORY_TRACT
  Filled 2016-04-04: qty 6

## 2016-04-04 MED ORDER — LISINOPRIL 5 MG PO TABS
5.0000 mg | ORAL_TABLET | Freq: Every day | ORAL | Status: DC
  Administered 2016-04-04 – 2016-04-05 (×2): 5 mg via ORAL
  Filled 2016-04-04 (×5): qty 1

## 2016-04-04 MED ORDER — HEPARIN SODIUM (PORCINE) 5000 UNIT/ML IJ SOLN
5000.0000 [IU] | Freq: Three times a day (TID) | INTRAMUSCULAR | Status: DC
  Administered 2016-04-04 – 2016-04-05 (×3): 5000 [IU] via SUBCUTANEOUS
  Filled 2016-04-04 (×3): qty 1

## 2016-04-04 MED ORDER — HYDROCODONE-ACETAMINOPHEN 5-325 MG PO TABS: 1.0000 | ORAL_TABLET | ORAL | Status: DC | PRN

## 2016-04-04 MED ORDER — ONDANSETRON HCL 4 MG/2ML IJ SOLN: 4.0000 mg | Freq: Four times a day (QID) | INTRAMUSCULAR | Status: DC | PRN

## 2016-04-04 MED ORDER — ACETAMINOPHEN 325 MG PO TABS
650.0000 mg | ORAL_TABLET | Freq: Once | ORAL | Status: AC
  Administered 2016-04-04: 650 mg via ORAL
  Filled 2016-04-04: qty 2

## 2016-04-04 MED ORDER — OSELTAMIVIR PHOSPHATE 75 MG PO CAPS
75.0000 mg | ORAL_CAPSULE | Freq: Two times a day (BID) | ORAL | Status: DC
  Administered 2016-04-04: 75 mg via ORAL
  Filled 2016-04-04 (×2): qty 1

## 2016-04-04 MED ORDER — METOPROLOL TARTRATE 5 MG/5ML IV SOLN: 10.0000 mg | Freq: Four times a day (QID) | INTRAVENOUS | Status: DC | PRN

## 2016-04-04 MED ORDER — BUDESONIDE 0.25 MG/2ML IN SUSP
0.2500 mg | Freq: Two times a day (BID) | RESPIRATORY_TRACT | Status: DC
Start: 2016-04-04 — End: 2016-04-05
  Administered 2016-04-04 – 2016-04-05 (×2): 0.25 mg via RESPIRATORY_TRACT
  Filled 2016-04-04 (×2): qty 2

## 2016-04-04 MED ORDER — BECLOMETHASONE DIPROPIONATE 80 MCG/ACT IN AERS
2.0000 | INHALATION_SPRAY | Freq: Two times a day (BID) | RESPIRATORY_TRACT | Status: DC | PRN
  Filled 2016-04-04: qty 8.7

## 2016-04-04 MED ORDER — DEXTROSE 5 % IV SOLN
1.0000 g | Freq: Once | INTRAVENOUS | Status: AC
  Administered 2016-04-04: 1 g via INTRAVENOUS
  Filled 2016-04-04: qty 10

## 2016-04-04 MED ORDER — SODIUM CHLORIDE 0.9 % IV SOLN: INTRAVENOUS | Status: DC

## 2016-04-04 MED ORDER — SODIUM CHLORIDE 0.9% FLUSH
3.0000 mL | Freq: Two times a day (BID) | INTRAVENOUS | Status: DC
  Administered 2016-04-04 – 2016-04-07 (×7): 3 mL via INTRAVENOUS

## 2016-04-04 MED ORDER — POTASSIUM CHLORIDE CRYS ER 20 MEQ PO TBCR
40.0000 meq | EXTENDED_RELEASE_TABLET | Freq: Every day | ORAL | Status: DC
  Administered 2016-04-04: 40 meq via ORAL
  Filled 2016-04-04 (×2): qty 2

## 2016-04-04 MED ORDER — FUROSEMIDE 10 MG/ML IJ SOLN
80.0000 mg | Freq: Once | INTRAMUSCULAR | Status: AC
  Administered 2016-04-04: 80 mg via INTRAVENOUS
  Filled 2016-04-04: qty 8

## 2016-04-04 MED ORDER — DEXTROSE 5 % IV SOLN
500.0000 mg | Freq: Once | INTRAVENOUS | Status: AC
  Administered 2016-04-04: 500 mg via INTRAVENOUS
  Filled 2016-04-04: qty 500

## 2016-04-04 NOTE — H&P (Signed)
History and Physical    [name]:[id number] DOB: [birth year] DOA: 04/04/2016  PCP: Pearson Grippe, MD  Patient coming from: Home  Chief Complaint: SOB  HPI: [name] is a 50 y.o. male with medical history significant of CHF LVEF was 20-25%, A. fib, HTN and chronic lower extremity edema. Patient came to the hospital with shortness of breath. Patient comes to the hospital very frequently and per previous notes has history of nonadherence to treatment. Reported for the past 2 days worsening SOB, orthopnea, DOE so came into the hospital for further evaluation. He was found to be febrile with temp of 101 as well.  ED Course:  Vitals: Heart rate 113 and temp of 101.4 Labs: BNP 663, potassium 3.2, the rest is WNL Imaging: Bibasilar infiltrates versus edema  Interventions:Given azithromycin, Rocephin, Lasix, potassium and Lopressor.  Review of Systems:  Constitutional: negative for anorexia, fevers and sweats Eyes: negative for irritation, redness and visual disturbance Ears, nose, mouth, throat, and face: negative for earaches, epistaxis, nasal congestion and sore throat Respiratory: negative for cough, dyspnea on exertion, sputum and wheezing Cardiovascular: SOB Gastrointestinal: negative for abdominal pain, constipation, diarrhea, melena, nausea and vomiting Genitourinary:negative for dysuria, frequency and hematuria Hematologic/lymphatic: negative for bleeding, easy bruising and lymphadenopathy Musculoskeletal:negative for arthralgias, muscle weakness and stiff joints Neurological: negative for coordination problems, gait problems, headaches and weakness Endocrine: negative for diabetic symptoms including polydipsia, polyuria and weight loss Allergic/Immunologic: negative for anaphylaxis, hay fever and urticaria  Past Medical History:  Diagnosis Date  . Allergic rhinitis   . Asthma   . Atrial fibrillation (HCC)   . CHF (congestive heart failure) (HCC)   . Essential  hypertension   . Gastroesophageal reflux disease   . Heart attack   . History of cardiac catheterization    Normal coronaries December 2016  . Noncompliance    Major problem leading to declining health and recurrent hospitalization  . Nonischemic cardiomyopathy (HCC)    LVEF 20-25%  . OSA (obstructive sleep apnea)   . Osteoarthritis   . Peptic ulcer disease     Past Surgical History:  Procedure Laterality Date  . CARDIAC CATHETERIZATION N/A 06/14/2015   Procedure: Right/Left Heart Cath and Coronary Angiography;  Surgeon: Runell Gess, MD;  Location: [facility] INVASIVE CV LAB;  Service: Cardiovascular;  Laterality: N/A;     reports that he quit smoking about 8 years ago. His smoking use included Cigarettes. He started smoking about 27 years ago. He has a 10.00 pack-year smoking history. He has never used smokeless tobacco. He reports that he does not drink alcohol or use drugs.  Allergies  Allergen Reactions  . Bee Venom Shortness Of Breath, Swelling and Other (See Comments)    Reaction:  Facial swelling  . Food Shortness Of Breath, Rash and Other (See Comments)    Pt states that he is allergic to strawberries.    . Lipitor [Atorvastatin] Shortness Of Breath and Other (See Comments)    Reaction:  Nose bleeds   . Aspirin Other (See Comments)    Reaction:  GI upset   . Banana Diarrhea  . Orange Juice [Orange Oil] Nausea And Vomiting  . Torsemide Swelling and Other (See Comments)    Reaction:  Swelling of feet/legs   . Diltiazem Palpitations  . Hydralazine Palpitations    Family History  Problem Relation Age of Onset  . Stroke Father   . Heart attack Father   . Aneurysm Mother     Cerebral aneurysm  .  Hypertension Sister   . Colon cancer Neg Hx   . Inflammatory bowel disease Neg Hx   . Liver disease Neg Hx     Prior to Admission medications   Medication Sig Start Date End Date Taking? Authorizing Provider  atorvastatin (LIPITOR) 80 MG tablet Take 80 mg by mouth at  bedtime.     Historical Provider, MD  beclomethasone (QVAR) 80 MCG/ACT inhaler Inhale 2 puffs into the lungs 2 (two) times daily as needed (for shortness of breath).    Historical Provider, MD  furosemide (LASIX) 80 MG tablet Take 1 tablet (80 mg total) by mouth 2 (two) times daily. 03/25/16   Ace GinsSerena Y Sam, PA-C  lisinopril (PRINIVIL,ZESTRIL) 5 MG tablet Take 1 tablet (5 mg total) by mouth daily. 01/17/16   Glynn OctaveStephen Rancour, MD  metoprolol succinate (TOPROL-XL) 25 MG 24 hr tablet Take 25 mg by mouth 3 (three) times daily.    Historical Provider, MD  potassium chloride SA (K-DUR,KLOR-CON) 20 MEQ tablet Take 40 mEq by mouth daily.     Historical Provider, MD  rivaroxaban (XARELTO) 20 MG TABS tablet Take 20 mg by mouth daily with breakfast.    Historical Provider, MD    Physical Exam:  Vitals:   04/04/16 1315 04/04/16 1330 04/04/16 1341 04/04/16 1400  BP:  (!) 135/119  (!) 134/117  Pulse:    62  Resp: 25 12  (!) 27  Temp:   101.4 F (38.6 C)   TempSrc:   Rectal   SpO2:    99%  Weight:      Height:        Constitutional: NAD, calm, comfortable Eyes: PERRL, lids and conjunctivae normal ENMT: Mucous membranes are moist. Posterior pharynx clear of any exudate or lesions.Normal dentition.  Neck: normal, supple, no masses, no thyromegaly Respiratory: clear to auscultation bilaterally, no wheezing, no crackles. Normal respiratory effort. No accessory muscle use.  Cardiovascular: Regular rate and rhythm, no murmurs / rubs / gallops. No extremity edema. 2+ pedal pulses. No carotid bruits.  Abdomen: no tenderness, no masses palpated. No hepatosplenomegaly. Bowel sounds positive.  Musculoskeletal: no clubbing / cyanosis. No joint deformity upper and lower extremities. Good ROM, no contractures. Normal muscle tone.  Skin: no rashes, lesions, ulcers. No induration Neurologic: CN 2-12 grossly intact. Sensation intact, DTR normal. Strength 5/5 in all 4.  Psychiatric: Normal judgment and insight. Alert  and oriented x 3. Normal mood.   Labs on Admission: I have personally reviewed following labs and imaging studies  CBC:  Recent Labs Lab 03/28/16 1850 03/30/16 1231 04/04/16 1235  WBC 3.7* 3.6* 6.2  NEUTROABS  --  2.3  --   HGB 10.4* 10.1* 9.6*  HCT 33.9* 32.7* 31.4*  MCV 83.5 82.8 82.8  PLT 234 233 215   Basic Metabolic Panel:  Recent Labs Lab 03/28/16 1850 03/30/16 1231 04/04/16 1235  NA 140 140 140  K 3.3* 3.4* 3.2*  CL 105 107 106  CO2 29 28 28   GLUCOSE 96 88 89  BUN 19 18 18   CREATININE 1.12 1.20 1.17  CALCIUM 8.9 8.7* 8.6*   GFR: Estimated Creatinine Clearance: 111.5 mL/min (by C-G formula based on SCr of 1.17 mg/dL). Liver Function Tests: No results for input(s): AST, ALT, ALKPHOS, BILITOT, PROT, ALBUMIN in the last 168 hours. No results for input(s): LIPASE, AMYLASE in the last 168 hours. No results for input(s): AMMONIA in the last 168 hours. Coagulation Profile:  Recent Labs Lab 04/04/16 1235  INR 1.22   Cardiac  Enzymes:  Recent Labs Lab 03/28/16 1850 04/04/16 1235  TROPONINI 0.03* 0.03*   BNP (last 3 results) No results for input(s): PROBNP in the last 8760 hours. HbA1C: No results for input(s): HGBA1C in the last 72 hours. CBG: No results for input(s): GLUCAP in the last 168 hours. Lipid Profile: No results for input(s): CHOL, HDL, LDLCALC, TRIG, CHOLHDL, LDLDIRECT in the last 72 hours. Thyroid Function Tests: No results for input(s): TSH, T4TOTAL, FREET4, T3FREE, THYROIDAB in the last 72 hours. Anemia Panel: No results for input(s): VITAMINB12, FOLATE, FERRITIN, TIBC, IRON, RETICCTPCT in the last 72 hours. Urine analysis:    Component Value Date/Time   COLORURINE YELLOW 01/17/2016 1029   APPEARANCEUR CLEAR 01/17/2016 1029   LABSPEC 1.020 01/17/2016 1029   PHURINE 6.0 01/17/2016 1029   GLUCOSEU NEGATIVE 01/17/2016 1029   GLUCOSEU NEG mg/dL 49/20/1007 1219   HGBUR TRACE (A) 01/17/2016 1029   BILIRUBINUR NEGATIVE 01/17/2016 1029     KETONESUR NEGATIVE 01/17/2016 1029   PROTEINUR 100 (A) 01/17/2016 1029   UROBILINOGEN 0.2 08/08/2014 1445   NITRITE NEGATIVE 01/17/2016 1029   LEUKOCYTESUR NEGATIVE 01/17/2016 1029   Sepsis Labs: !!!!!!!!!!!!!!!!!!!!!!!!!!!!!!!!!!!!!!!!!!!! Invalid input(s): PROCALCITONIN, LACTICIDVEN No results found for this or any previous visit (from the past 240 hour(s)).   Radiological Exams on Admission: Dg Chest Port 1 View  Result Date: 04/04/2016 CLINICAL DATA:  Chest pain and shortness of breath for 2 days EXAM: PORTABLE CHEST 1 VIEW COMPARISON:  April 02, 2016 FINDINGS: Bilateral pulmonary opacities are identified. The opacities are more focal and prominent in the right mid and lower lung versus the left. No pneumothorax. Cardiomegaly. The hila and mediastinum are unremarkable. No other acute abnormalities IMPRESSION: Cardiomegaly and mild edema. The more focal patchy opacity in the right lung versus the left could represent asymmetric edema but is at least moderately worrisome for a superimposed multi focal pneumonia. Electronically Signed   By: Gerome Sam III M.D   On: 04/04/2016 12:39    EKG: Independently reviewed.   Assessment/Plan Principal Problem:   Systolic and diastolic CHF, acute on chronic (HCC) Active Problems:   Edema of right lower extremity   Atrial fibrillation with rapid ventricular response (HCC)   Acute respiratory failure with hypoxia (HCC)   Elevated troponin   CAP (community acquired pneumonia)   Acute on chronic combined systolic and diastolic CHF -Patient presented with shortness of breath, elevated BNP, chest x-ray with congestion. -Findings consistent with acute exacerbation of CHF, started on IV Lasix, follow BMP closely. -Intake/output and follow daily weight.  Comment acquired pneumonia -Has temperature of 101.4, chest x-ray not sure which is edema versus pneumonia. -Started on Rocephin and azithromycin will continue. -Check flu PCR, started on  Tamiflu empirically.  Atrial fibrillation with rapid ventricular response -CHA2DS2-VASc is 34 HTN, CAD and CHF, patient is on Xarelto. -Rate is not controlled likely secondary to CHF exacerbation, restart his Toprol-XL. -Toprol XL as needed, Xopenex instead of albuterol.  Hypokalemia -Replete with oral supplements, this is likely secondary to diuresis.  Acute respiratory failure with hypoxia -Secondary to exacerbation of CHF plus or minus pneumonia. -Treat CHF exacerbation and pneumonia, supplemental oxygen via nasal cannula.   DVT prophylaxis: SQ Heparin Code Status:  Full Family Communication: Plan D/W patient Disposition Plan: Home Consults called:  Admission status: Inpatient, telemetry   [facility] A MD [facility] Pager [phone]  If 7PM-7AM, please contact night-coverage [email or web address] Password [email or web address]  04/04/2016, 2:45 PM

## 2016-04-04 NOTE — ED Notes (Signed)
CRITICAL VALUE ALERT  Critical value received:  Troponin 0.03  Date of notification:  04/04/2016  Time of notification:  1336  Critical value read back:Yes.    Nurse who received alert:  [facility] RN  MD notified (1st page):  Dr. Rosalia Hammers  Time of first page:  1336  MD notified (2nd page):  Time of second page:  Responding MD:  Dr. Rosalia Hammers  Time MD responded:  1336

## 2016-04-04 NOTE — ED Notes (Signed)
Went back to pt room resp and lab in with pt

## 2016-04-04 NOTE — Progress Notes (Signed)
Patient HR elevated to the 160s. Paged Dr. Arthor Captain and received verbal order for 10 mg of IV metoprolol every 6 hours as needed for a heart rate greater than 120 bpm.

## 2016-04-04 NOTE — ED Provider Notes (Signed)
[facility]Y DEPT Provider Note   CSN: [id number] Arrival date & time: 04/04/16  1145     History   Chief Complaint Chief Complaint  Patient presents with  . Shortness of Breath    HPI [name] is a 50 y.o. male.history of chf with increased dyspnea with cough productive of clear sputum.  States cold and cna't get warm- does not think he has had a fever.  States jincreased peripheral edema although taking all meds as ordered.  Last weight 282 after last hospitalization- thinks gaining fluid weight.  On oxygen here prior to my evaluation with some improvement.  Chest tight a little bit this am for 2-3 hours- took advil with some relief.  PMD Luna FuseJulie Joyner, PA at [facility]-   HPI  Past Medical History:  Diagnosis Date  . Allergic rhinitis   . Asthma   . Atrial fibrillation (HCC)   . CHF (congestive heart failure) (HCC)   . Essential hypertension   . Gastroesophageal reflux disease   . Heart attack   . History of cardiac catheterization    Normal coronaries December 2016  . Noncompliance    Major problem leading to declining health and recurrent hospitalization  . Nonischemic cardiomyopathy (HCC)    LVEF 20-25%  . OSA (obstructive sleep apnea)   . Osteoarthritis   . Peptic ulcer disease     Patient Active Problem List   Diagnosis Date Noted  . Acute on chronic congestive heart failure (HCC)   . Coronary arteries, normal Dec 2016 01/23/2016  . Chronic anticoagulation-Xarelto 01/23/2016  . Chronic renal insufficiency, stage III (moderate) 01/23/2016  . NSVT (nonsustained ventricular tachycardia) (HCC) 01/23/2016  . CHF (congestive heart failure) (HCC) 01/20/2016  . CHF exacerbation (HCC) 01/20/2016  . Acute systolic CHF (congestive heart failure) (HCC) 12/21/2015  . Acute on chronic combined systolic and diastolic CHF (congestive heart failure) (HCC) 12/21/2015  . Elevated troponin 12/21/2015  . Congestive heart failure (CHF) (HCC) 12/03/2015   . Acute on chronic combined systolic (congestive) and diastolic (congestive) heart failure   . Nonischemic cardiomyopathy (HCC)   . Morbid obesity due to excess calories (HCC)   . Noncompliance with diet and medication regimen 12/02/2015  . Acute on chronic systolic congestive heart failure (HCC)   . Acute diastolic CHF (congestive heart failure) (HCC) 11/18/2015  . Acute renal failure superimposed on stage 3 chronic kidney disease (HCC) 10/28/2015  . Acute on chronic combined systolic and diastolic heart failure (HCC) 10/08/2015  . OSA (obstructive sleep apnea) 09/20/2015  . Pulmonary hypertension 09/19/2015  . Leukopenia 09/19/2015  . Normocytic anemia 09/19/2015  . Chronic atrial fibrillation (HCC)   . Dyspnea on exertion 05/28/2015  . Acute respiratory failure with hypoxia (HCC) 04/01/2015  . Pneumonia 04/01/2015  . Hypokalemia 04/01/2015  . Obesity hypoventilation syndrome (HCC) 08/08/2014  . GERD (gastroesophageal reflux disease) 08/08/2014  . Noncompliance with medication regimen 06/28/2014  . Atrial fibrillation with rapid ventricular response (HCC) 06/28/2014  . Edema of right lower extremity 06/21/2014  . Fecal urgency 06/21/2014  . Asthma 02/11/2009  . Hyperlipidemia 07/12/2008  . OBESITY, MORBID 07/12/2008  . Anxiety state 07/12/2008  . DEPRESSION 07/12/2008  . Essential hypertension 07/12/2008  . ALLERGIC RHINITIS 07/12/2008  . GERD 07/12/2008  . Peptic ulcer 07/12/2008  . OSTEOARTHRITIS 07/12/2008    Past Surgical History:  Procedure Laterality Date  . CARDIAC CATHETERIZATION N/A 06/14/2015   Procedure: Right/Left Heart Cath and Coronary Angiography;  Surgeon: Runell GessJonathan J Berry, MD;  Location: [facility];  Service: Cardiovascular;  Laterality: N/A;       Home Medications    Prior to Admission medications   Medication Sig Start Date End Date Taking? Authorizing Provider  atorvastatin (LIPITOR) 80 MG tablet Take 80 mg by mouth at bedtime.      Historical Provider, MD  beclomethasone (QVAR) 80 MCG/ACT inhaler Inhale 2 puffs into the lungs 2 (two) times daily as needed (for shortness of breath).    Historical Provider, MD  furosemide (LASIX) 80 MG tablet Take 1 tablet (80 mg total) by mouth 2 (two) times daily. 03/25/16   Ace Gins Sam, PA-C  lisinopril (PRINIVIL,ZESTRIL) 5 MG tablet Take 1 tablet (5 mg total) by mouth daily. 01/17/16   Glynn Octave, MD  metoprolol succinate (TOPROL-XL) 25 MG 24 hr tablet Take 25 mg by mouth 3 (three) times daily.    Historical Provider, MD  potassium chloride SA (K-DUR,KLOR-CON) 20 MEQ tablet Take 40 mEq by mouth daily.     Historical Provider, MD  rivaroxaban (XARELTO) 20 MG TABS tablet Take 20 mg by mouth daily with breakfast.    Historical Provider, MD    Family History Family History  Problem Relation Age of Onset  . Stroke Father   . Heart attack Father   . Aneurysm Mother     Cerebral aneurysm  . Hypertension Sister   . Colon cancer Neg Hx   . Inflammatory bowel disease Neg Hx   . Liver disease Neg Hx     Social History Social History  Substance Use Topics  . Smoking status: Former Smoker    Packs/day: 0.50    Years: 20.00    Types: Cigarettes    Start date: 04/26/1988    Quit date: 06/23/2007  . Smokeless tobacco: Never Used     Comment: 1 ppd former smoker  . Alcohol use No     Comment: No etoh since 2009     Allergies   Bee venom; Food; Lipitor [atorvastatin]; Aspirin; Banana; Orange juice [orange oil]; Torsemide; Diltiazem; and Hydralazine   Review of Systems Review of Systems  Constitutional: Positive for fatigue.  HENT: Positive for congestion.   Eyes: Negative.   Respiratory: Positive for cough, chest tightness and shortness of breath.   Cardiovascular: Positive for chest pain and leg swelling.  Gastrointestinal: Negative.   Musculoskeletal: Negative.   Neurological: Negative.   Hematological: Negative.   Psychiatric/Behavioral: Negative.      Physical  Exam Updated Vital Signs BP 166/96 (BP Location: Right Arm)   Pulse 109   Temp 99.6 F (37.6 C) (Oral)   Resp 26   Ht 6' (1.829 m)   Wt 136.1 kg   SpO2 100%   BMI 40.69 kg/m   Physical Exam  Constitutional: He appears well-developed and well-nourished. No distress.  Morbidly obese appears dyspneic.   HENT:  Head: Normocephalic and atraumatic.  Right Ear: External ear normal.  Left Ear: External ear normal.  Nose: Nose normal.  Mouth/Throat: Oropharynx is clear and moist.  Eyes: EOM are normal. Pupils are equal, round, and reactive to light.  Neck: Normal range of motion. Neck supple.  Cardiovascular: An irregularly irregular rhythm present.  Pulmonary/Chest:  Decreased bs throughtout  Abdominal: Soft. Bowel sounds are normal.  Musculoskeletal: He exhibits edema.  Neurological: He is alert.  Skin: Skin is warm. Capillary refill takes less than 2 seconds.  Nursing note and vitals reviewed.    ED Treatments / Results  Labs (all labs ordered  are listed, but only abnormal results are displayed) Labs Reviewed  BASIC METABOLIC PANEL - Abnormal; Notable for the following:       Result Value   Potassium 3.2 (*)    Calcium 8.6 (*)    All other components within normal limits  CBC - Abnormal; Notable for the following:    RBC 3.79 (*)    Hemoglobin 9.6 (*)    HCT 31.4 (*)    MCH 25.3 (*)    RDW 16.1 (*)    All other components within normal limits  TROPONIN I - Abnormal; Notable for the following:    Troponin I 0.03 (*)    All other components within normal limits  BRAIN NATRIURETIC PEPTIDE - Abnormal; Notable for the following:    B Natriuretic Peptide 663.0 (*)    All other components within normal limits  BLOOD GAS, ARTERIAL - Abnormal; Notable for the following:    pO2, Arterial 110 (*)    All other components within normal limits  PROTIME-INR - Abnormal; Notable for the following:    Prothrombin Time 15.5 (*)    All other components within normal limits   CULTURE, BLOOD (ROUTINE X 2)  CULTURE, BLOOD (ROUTINE X 2)  INFLUENZA PANEL BY PCR (TYPE A & B, H1N1)    EKG  EKG Interpretation None       Radiology Dg Chest Port 1 View  Result Date: 04/04/2016 CLINICAL DATA:  Chest pain and shortness of breath for 2 days EXAM: PORTABLE CHEST 1 VIEW COMPARISON:  April 02, 2016 FINDINGS: Bilateral pulmonary opacities are identified. The opacities are more focal and prominent in the right mid and lower lung versus the left. No pneumothorax. Cardiomegaly. The hila and mediastinum are unremarkable. No other acute abnormalities IMPRESSION: Cardiomegaly and mild edema. The more focal patchy opacity in the right lung versus the left could represent asymmetric edema but is at least moderately worrisome for a superimposed multi focal pneumonia. Electronically Signed   By: Gerome Sam III M.D   On: 04/04/2016 12:39    Procedures Procedures (including critical care time)  Medications Ordered in ED Medications - No data to display   Initial Impression / Assessment and Plan / ED Course  I have reviewed the triage vital signs and the nursing notes.  Pertinent labs & imaging results that were available during my care of the patient were reviewed by me and considered in my medical decision making (see chart for details).  Clinical Course   50 y.o. Male h.o. chf , afib rvr presents with increased dyspnea.  Work up reveals fever, infiltrates on cxr and increased hr.  Patient treated with lasix, lopressor, antibiotics, and nebulizers. Dr. Arthor Captain plans admission to telemetry bed.    Final Clinical Impressions(s) / ED Diagnoses   Final diagnoses:  Community acquired pneumonia, unspecified laterality  Atrial fibrillation with RVR (HCC)    New Prescriptions New Prescriptions   No medications on file     Margarita Grizzle, MD 04/04/16 1431

## 2016-04-04 NOTE — ED Notes (Signed)
Went to get vital reg is in with pt

## 2016-04-04 NOTE — ED Notes (Signed)
Hospitalist at bedside 

## 2016-04-04 NOTE — ED Notes (Signed)
Went to do vitals but Dr is in with pt at this time

## 2016-04-04 NOTE — ED Triage Notes (Signed)
Pt reports sob and cp in right side of chest radiating to middle of chest beginning yesterday. Pt also reports feeling cold and having weakness.  Pt also feels like his legs have been getting more swollen. PT in a-fib on monitor with EMS (107-120hr) , pt on 3L with EMS, 02 sat 96%.  Pt alert and oriented at this time.

## 2016-04-05 LAB — BASIC METABOLIC PANEL
ANION GAP: 7 (ref 5–15)
BUN: 18 mg/dL (ref 6–20)
CALCIUM: 8.2 mg/dL — AB (ref 8.9–10.3)
CO2: 25 mmol/L (ref 22–32)
Chloride: 107 mmol/L (ref 101–111)
Creatinine, Ser: 1.07 mg/dL (ref 0.61–1.24)
GLUCOSE: 91 mg/dL (ref 65–99)
POTASSIUM: 3.2 mmol/L — AB (ref 3.5–5.1)
Sodium: 139 mmol/L (ref 135–145)

## 2016-04-05 LAB — CBC
HEMATOCRIT: 29 % — AB (ref 39.0–52.0)
HEMOGLOBIN: 9 g/dL — AB (ref 13.0–17.0)
MCH: 25.6 pg — ABNORMAL LOW (ref 26.0–34.0)
MCHC: 31 g/dL (ref 30.0–36.0)
MCV: 82.4 fL (ref 78.0–100.0)
Platelets: 212 10*3/uL (ref 150–400)
RBC: 3.52 MIL/uL — AB (ref 4.22–5.81)
RDW: 16.1 % — ABNORMAL HIGH (ref 11.5–15.5)
WBC: 6.6 10*3/uL (ref 4.0–10.5)

## 2016-04-05 MED ORDER — MAGNESIUM SULFATE 2 GM/50ML IV SOLN
2.0000 g | Freq: Once | INTRAVENOUS | Status: AC
  Administered 2016-04-05: 2 g via INTRAVENOUS
  Filled 2016-04-05: qty 50

## 2016-04-05 MED ORDER — CARVEDILOL 12.5 MG PO TABS
25.0000 mg | ORAL_TABLET | Freq: Two times a day (BID) | ORAL | Status: DC
  Administered 2016-04-05 – 2016-04-07 (×5): 25 mg via ORAL
  Filled 2016-04-05 (×5): qty 2

## 2016-04-05 MED ORDER — POTASSIUM CHLORIDE CRYS ER 20 MEQ PO TBCR
60.0000 meq | EXTENDED_RELEASE_TABLET | Freq: Three times a day (TID) | ORAL | Status: AC
  Administered 2016-04-05 (×2): 60 meq via ORAL
  Filled 2016-04-05 (×2): qty 3

## 2016-04-05 MED ORDER — BUDESONIDE 0.25 MG/2ML IN SUSP: 0.2500 mg | Freq: Two times a day (BID) | RESPIRATORY_TRACT | Status: DC | PRN

## 2016-04-05 MED ORDER — SPIRONOLACTONE 25 MG PO TABS
50.0000 mg | ORAL_TABLET | Freq: Every day | ORAL | Status: DC
  Administered 2016-04-05 – 2016-04-07 (×3): 50 mg via ORAL
  Filled 2016-04-05 (×3): qty 2

## 2016-04-05 NOTE — Progress Notes (Signed)
PROGRESS NOTE  Samuel KlippelMark T Uram  ONG:[id number] DOB: 09/17/1965 DOA: 04/04/2016 PCP: Pearson GrippeJames Kim, MD Outpatient Specialists:  Subjective: Feels much better, per records urine output is 5.7 L? Weight also is down from 300 to 298 lbs  Brief Narrative:  Samuel KlippelMark T Fitzpatrick is a 50 y.o. male with medical history significant of CHF LVEF was 20-25%, A. fib, HTN and chronic lower extremity edema. Patient came to the hospital with shortness of breath. Patient comes to the hospital very frequently and per previous notes has history of nonadherence to treatment. Reported for the past 2 days worsening SOB, orthopnea, DOE so came into the hospital for further evaluation. He was found to be febrile with temp of 101 as well  Assessment & Plan:   Principal Problem:   Systolic and diastolic CHF, acute on chronic (HCC) Active Problems:   Edema of right lower extremity   Atrial fibrillation with rapid ventricular response (HCC)   Acute respiratory failure with hypoxia (HCC)   Elevated troponin   CAP (community acquired pneumonia)   Acute on chronic combined systolic and diastolic CHF -Patient presented with shortness of breath, elevated BNP, chest x-ray with congestion. -Findings consistent with acute exacerbation of CHF, started on IV Lasix, follow BMP closely. -Intake/output and follow daily weight. -Toprol-XL switched to Coreg for better rate control.  Comment acquired pneumonia -Has temperature of 101.4, chest x-ray not sure which is edema versus pneumonia. -Started on Rocephin and azithromycin will continue. -Flu PCR negative, Tamiflu discontinued  Atrial fibrillation with rapid ventricular response -CHA2DS2-VASc is 34 HTN, CAD and CHF, patient is on Xarelto. -Rate is not controlled likely secondary to CHF exacerbation, started on Coreg -Lopressor IV as needed  Hypokalemia -Replete with oral supplements, this is likely secondary to diuresis. Check BMP in a.m.  Acute respiratory failure with  hypoxia -Secondary to exacerbation of CHF plus or minus pneumonia. -Treat CHF exacerbation and pneumonia, supplemental oxygen via nasal cannula.   DVT prophylaxis: Subcutaneous heparin Code Status: Full Code Family Communication:  Disposition Plan:  Diet: Diet Heart Room service appropriate? Yes; Fluid consistency: Thin; Fluid restriction: 1500 mL Fluid  Consultants:   None  Procedures:   Noen  Antimicrobials:   Rocephin and azithromycin  Objective: Vitals:   04/04/16 2151 04/05/16 0433 04/05/16 0643 04/05/16 0740  BP: 131/84 (!) 141/91    Pulse: 91 (!) 101    Resp: (!) 21 15    Temp: 99.5 F (37.5 C) 99.3 F (37.4 C)    TempSrc: Oral Oral    SpO2: 94% 94%  90%  Weight:   135.3 kg (298 lb 4.8 oz)   Height:        Intake/Output Summary (Last 24 hours) at 04/05/16 0952 Last data filed at 04/05/16 0434  Gross per 24 hour  Intake              730 ml  Output             5750 ml  Net            -5020 ml   Filed Weights   04/04/16 1147 04/04/16 1602 04/05/16 0643  Weight: 136.1 kg (300 lb) (!) 138.1 kg (304 lb 6.4 oz) 135.3 kg (298 lb 4.8 oz)    Examination: General exam: Appears calm and comfortable  Respiratory system: Clear to auscultation. Respiratory effort normal. Cardiovascular system: S1 & S2 heard, RRR. No JVD, murmurs, rubs, gallops or clicks. No pedal edema. Gastrointestinal system: Abdomen is nondistended, soft and nontender.  No organomegaly or masses felt. Normal bowel sounds heard. Central nervous system: Alert and oriented. No focal neurological deficits. Extremities: Symmetric 5 x 5 power. Skin: No rashes, lesions or ulcers Psychiatry: Judgement and insight appear normal. Mood & affect appropriate.   Data Reviewed: I have personally reviewed following labs and imaging studies  CBC:  Recent Labs Lab 03/30/16 1231 04/04/16 1235 04/05/16 0541  WBC 3.6* 6.2 6.6  NEUTROABS 2.3  --   --   HGB 10.1* 9.6* 9.0*  HCT 32.7* 31.4* 29.0*  MCV 82.8  82.8 82.4  PLT 233 215 212   Basic Metabolic Panel:  Recent Labs Lab 03/30/16 1231 04/04/16 1235 04/05/16 0541  NA 140 140 139  K 3.4* 3.2* 3.2*  CL 107 106 107  CO2 28 28 25   GLUCOSE 88 89 91  BUN 18 18 18   CREATININE 1.20 1.17 1.07  CALCIUM 8.7* 8.6* 8.2*   GFR: Estimated Creatinine Clearance: 121.6 mL/min (by C-G formula based on SCr of 1.07 mg/dL). Liver Function Tests: No results for input(s): AST, ALT, ALKPHOS, BILITOT, PROT, ALBUMIN in the last 168 hours. No results for input(s): LIPASE, AMYLASE in the last 168 hours. No results for input(s): AMMONIA in the last 168 hours. Coagulation Profile:  Recent Labs Lab 04/04/16 1235  INR 1.22   Cardiac Enzymes:  Recent Labs Lab 04/04/16 1235  TROPONINI 0.03*   BNP (last 3 results) No results for input(s): PROBNP in the last 8760 hours. HbA1C: No results for input(s): HGBA1C in the last 72 hours. CBG: No results for input(s): GLUCAP in the last 168 hours. Lipid Profile: No results for input(s): CHOL, HDL, LDLCALC, TRIG, CHOLHDL, LDLDIRECT in the last 72 hours. Thyroid Function Tests:  Recent Labs  04/04/16 1235  TSH 1.240   Anemia Panel: No results for input(s): VITAMINB12, FOLATE, FERRITIN, TIBC, IRON, RETICCTPCT in the last 72 hours. Urine analysis:    Component Value Date/Time   COLORURINE YELLOW 01/17/2016 1029   APPEARANCEUR CLEAR 01/17/2016 1029   LABSPEC 1.020 01/17/2016 1029   PHURINE 6.0 01/17/2016 1029   GLUCOSEU NEGATIVE 01/17/2016 1029   GLUCOSEU NEG mg/dL 46/80/3212 2482   HGBUR TRACE (A) 01/17/2016 1029   BILIRUBINUR NEGATIVE 01/17/2016 1029   KETONESUR NEGATIVE 01/17/2016 1029   PROTEINUR 100 (A) 01/17/2016 1029   UROBILINOGEN 0.2 08/08/2014 1445   NITRITE NEGATIVE 01/17/2016 1029   LEUKOCYTESUR NEGATIVE 01/17/2016 1029   Sepsis Labs: @LABRCNTIP (procalcitonin:4,lacticidven:4)  ) Recent Results (from the past 240 hour(s))  Blood culture (routine x 2)     Status: None  (Preliminary result)   Collection Time: 04/04/16  2:26 PM  Result Value Ref Range Status   Specimen Description BLOOD RIGHT ARM  Final   Special Requests BOTTLES DRAWN AEROBIC AND ANAEROBIC 6CC  Final   Culture PENDING  Incomplete   Report Status PENDING  Incomplete  Blood culture (routine x 2)     Status: None (Preliminary result)   Collection Time: 04/04/16  3:05 PM  Result Value Ref Range Status   Specimen Description BLOOD RIGHT ARM  Final   Special Requests BOTTLES DRAWN AEROBIC AND ANAEROBIC 6CC  Final   Culture PENDING  Incomplete   Report Status PENDING  Incomplete     Invalid input(s): PROCALCITONIN, LACTICACIDVEN   Radiology Studies: Dg Chest Port 1 View  Result Date: 04/04/2016 CLINICAL DATA:  Chest pain and shortness of breath for 2 days EXAM: PORTABLE CHEST 1 VIEW COMPARISON:  April 02, 2016 FINDINGS: Bilateral pulmonary opacities are identified. The  opacities are more focal and prominent in the right mid and lower lung versus the left. No pneumothorax. Cardiomegaly. The hila and mediastinum are unremarkable. No other acute abnormalities IMPRESSION: Cardiomegaly and mild edema. The more focal patchy opacity in the right lung versus the left could represent asymmetric edema but is at least moderately worrisome for a superimposed multi focal pneumonia. Electronically Signed   By: Gerome Sam III M.D   On: 04/04/2016 12:39        Scheduled Meds: . atorvastatin  80 mg Oral QHS  . azithromycin  500 mg Intravenous Q24H  . carvedilol  25 mg Oral BID WC  . cefTRIAXone (ROCEPHIN)  IV  1 g Intravenous Q24H  . furosemide  80 mg Intravenous BID  . lisinopril  5 mg Oral Daily  . potassium chloride SA  60 mEq Oral Q8H  . rivaroxaban  20 mg Oral Q breakfast  . sodium chloride flush  3 mL Intravenous Q12H  . spironolactone  50 mg Oral Daily   Continuous Infusions: . sodium chloride       LOS: 1 day    Time spent: 35 minutes    Georgeana Oertel A, MD [facility] Pager [phone]  If 7PM-7AM, please contact night-coverage [email or web address] Password [email or web address] 04/05/2016, 9:52 AM

## 2016-04-06 LAB — MAGNESIUM: MAGNESIUM: 2.2 mg/dL (ref 1.7–2.4)

## 2016-04-06 LAB — BASIC METABOLIC PANEL
ANION GAP: 6 (ref 5–15)
BUN: 25 mg/dL — ABNORMAL HIGH (ref 6–20)
CALCIUM: 8.3 mg/dL — AB (ref 8.9–10.3)
CO2: 26 mmol/L (ref 22–32)
CREATININE: 1.24 mg/dL (ref 0.61–1.24)
Chloride: 107 mmol/L (ref 101–111)
GFR calc non Af Amer: >60 mL/min (ref >60)
Glucose, Bld: 87 mg/dL (ref 65–99)
Potassium: 3.6 mmol/L (ref 3.5–5.1)
SODIUM: 139 mmol/L (ref 135–145)

## 2016-04-06 LAB — CBC
HCT: 30 % — ABNORMAL LOW (ref 39.0–52.0)
HEMOGLOBIN: 9.3 g/dL — AB (ref 13.0–17.0)
MCH: 25.6 pg — ABNORMAL LOW (ref 26.0–34.0)
MCHC: 31 g/dL (ref 30.0–36.0)
MCV: 82.6 fL (ref 78.0–100.0)
PLATELETS: 215 10*3/uL (ref 150–400)
RBC: 3.63 MIL/uL — AB (ref 4.22–5.81)
RDW: 16.2 % — ABNORMAL HIGH (ref 11.5–15.5)
WBC: 3.7 10*3/uL — AB (ref 4.0–10.5)

## 2016-04-06 LAB — HEMOGLOBIN A1C
HEMOGLOBIN A1C: 5.7 % — AB (ref 4.8–5.6)
Mean Plasma Glucose: 117 mg/dL

## 2016-04-06 NOTE — Care Management Note (Addendum)
Case Management Note  Patient Details  Name: [name] MRN: [id number] Date of Birth: [birth year] Subjective/Objective:                  Pt admitted with CHF. Pt is from home, lives with his mom and is independent with ADL's. Pt has PCP, transportation to appointments and no difficulty affording medications. Pt is non longer active with [facility], they have discharged him and he is not eligible for their services at this time.  I have called [facility] program to see if patient is still active with their services.   Action/Plan:  Patient has had [facility], [facility] and [facility] in the past and is not eligible for return to these [facility] agencies either. Will follow and seek new HH options.  Later entry: 04/06/2016 1514: Spoke with [employer] CM, who offers a counseling service for patient. Phone number [phone]  provided to patient  and encouraged patient to call today. Will follow.   Expected Discharge Date:       04/07/2016           Expected Discharge Plan:  Home w Home Health Services  In-House Referral:  NA  Discharge planning Services  CM Consult  Post Acute Care Choice:  Home Health, Resumption of Svcs/PTA Provider Choice offered to:  NA  DME Arranged:    DME Agency:     HH Arranged:    HH Agency:     Status of Service:  In process, will continue to follow  If discussed at Long Length of Stay Meetings, dates discussed:    Additional Comments:  [name], Samuel Oiler, RN 04/06/2016, 11:07 AM

## 2016-04-06 NOTE — Care Management Important Message (Signed)
Important Message  Patient Details  Name: [name] MRN: [id number] Date of Birth: 15-Nov-1965   Medicare Important Message Given:  Yes    Eliza Grissinger, [name], RN 04/06/2016, 11:08 AM

## 2016-04-06 NOTE — Progress Notes (Signed)
PROGRESS NOTE  [name]  AYO:[id number] DOB: April 06, 1966 DOA: 04/04/2016 PCP: Pearson Grippe, MD Outpatient Specialists:  Subjective: Reported over 8 L of negative fluid balance since admission, weight did not go with that only down by 5 pounds. Continue current Lasix dose.  Brief Narrative:  [name] is a 50 y.o. male with medical history significant of CHF LVEF was 20-25%, A. fib, HTN and chronic lower extremity edema. Patient came to the hospital with shortness of breath. Patient comes to the hospital very frequently and per previous notes has history of nonadherence to treatment. Reported for the past 2 days worsening SOB, orthopnea, DOE so came into the hospital for further evaluation. He was found to be febrile with temp of 101 as well  Assessment & Plan:   Principal Problem:   Systolic and diastolic CHF, acute on chronic (HCC) Active Problems:   Edema of right lower extremity   Atrial fibrillation with rapid ventricular response (HCC)   Acute respiratory failure with hypoxia (HCC)   Elevated troponin   CAP (community acquired pneumonia)   Acute on chronic combined systolic and diastolic CHF -Patient presented with shortness of breath, elevated BNP, chest x-ray with congestion. -Findings consistent with acute exacerbation of CHF, started on IV Lasix, follow BMP closely. -Intake/output and follow daily weight. -Toprol-XL switched to Coreg for better rate control, reported he is allergic to diltiazem and hydralazine. -Currently on Coreg, Lasix, lisinopril and Aldactone added.  Comment acquired pneumonia -Has temperature of 101.4, chest x-ray not sure which is edema versus pneumonia. -Started on Rocephin and azithromycin will continue. -Flu PCR negative, Tamiflu discontinued  Atrial fibrillation with rapid ventricular response -CHA2DS2-VASc is 3 for HTN, CAD and CHF, patient is on Xarelto. -Rate is not controlled likely secondary to CHF exacerbation, started on  Coreg -Lopressor IV as needed  Hypokalemia -Replete with oral supplements, this is likely secondary to diuresis. Check BMP in a.m.  Acute respiratory failure with hypoxia -Secondary to exacerbation of CHF plus or minus pneumonia. -Treat CHF exacerbation and pneumonia, supplemental oxygen via nasal cannula.   DVT prophylaxis: Subcutaneous heparin Code Status: Full Code Family Communication:  Disposition Plan:  Diet: Diet Heart Room service appropriate? Yes; Fluid consistency: Thin; Fluid restriction: 1500 mL Fluid  Consultants:   None  Procedures:   Noen  Antimicrobials:   Rocephin and azithromycin  Objective: Vitals:   04/05/16 0740 04/05/16 1600 04/05/16 2300 04/06/16 0718  BP:  105/64 125/82 132/80  Pulse:  99 96 100  Resp:  20 17 18   Temp:  98.2 F (36.8 C) 98.5 F (36.9 C) 97.6 F (36.4 C)  TempSrc:   Oral Oral  SpO2: 90% 100% 100% 100%  Weight:    134 kg (295 lb 6.7 oz)  Height:        Intake/Output Summary (Last 24 hours) at 04/06/16 1128 Last data filed at 04/06/16 0847  Gross per 24 hour  Intake              783 ml  Output             2475 ml  Net            -1692 ml   Filed Weights   04/04/16 1602 04/05/16 0643 04/06/16 0718  Weight: (!) 138.1 kg (304 lb 6.4 oz) 135.3 kg (298 lb 4.8 oz) 134 kg (295 lb 6.7 oz)    Examination: General exam: Appears calm and comfortable  Respiratory system: Clear to auscultation. Respiratory effort normal.  Cardiovascular system: S1 & S2 heard, RRR. No JVD, murmurs, rubs, gallops or clicks. No pedal edema. Gastrointestinal system: Abdomen is nondistended, soft and nontender. No organomegaly or masses felt. Normal bowel sounds heard. Central nervous system: Alert and oriented. No focal neurological deficits. Extremities: Symmetric 5 x 5 power. Skin: No rashes, lesions or ulcers Psychiatry: Judgement and insight appear normal. Mood & affect appropriate.   Data Reviewed: I have personally reviewed following labs  and imaging studies  CBC:  Recent Labs Lab 03/30/16 1231 04/04/16 1235 04/05/16 0541 04/06/16 0519  WBC 3.6* 6.2 6.6 3.7*  NEUTROABS 2.3  --   --   --   HGB 10.1* 9.6* 9.0* 9.3*  HCT 32.7* 31.4* 29.0* 30.0*  MCV 82.8 82.8 82.4 82.6  PLT 233 215 212 215   Basic Metabolic Panel:  Recent Labs Lab 03/30/16 1231 04/04/16 1235 04/05/16 0541 04/06/16 0519  NA 140 140 139 139  K 3.4* 3.2* 3.2* 3.6  CL 107 106 107 107  CO2 28 28 25 26   GLUCOSE 88 89 91 87  BUN 18 18 18  25*  CREATININE 1.20 1.17 1.07 1.24  CALCIUM 8.7* 8.6* 8.2* 8.3*  MG  --   --   --  2.2   GFR: Estimated Creatinine Clearance: 104.4 mL/min (by C-G formula based on SCr of 1.24 mg/dL). Liver Function Tests: No results for input(s): AST, ALT, ALKPHOS, BILITOT, PROT, ALBUMIN in the last 168 hours. No results for input(s): LIPASE, AMYLASE in the last 168 hours. No results for input(s): AMMONIA in the last 168 hours. Coagulation Profile:  Recent Labs Lab 04/04/16 1235  INR 1.22   Cardiac Enzymes:  Recent Labs Lab 04/04/16 1235  TROPONINI 0.03*   BNP (last 3 results) No results for input(s): PROBNP in the last 8760 hours. HbA1C:  Recent Labs  04/04/16 1235  HGBA1C 5.7*   CBG: No results for input(s): GLUCAP in the last 168 hours. Lipid Profile: No results for input(s): CHOL, HDL, LDLCALC, TRIG, CHOLHDL, LDLDIRECT in the last 72 hours. Thyroid Function Tests:  Recent Labs  04/04/16 1235  TSH 1.240   Anemia Panel: No results for input(s): VITAMINB12, FOLATE, FERRITIN, TIBC, IRON, RETICCTPCT in the last 72 hours. Urine analysis:    Component Value Date/Time   COLORURINE YELLOW 01/17/2016 1029   APPEARANCEUR CLEAR 01/17/2016 1029   LABSPEC 1.020 01/17/2016 1029   PHURINE 6.0 01/17/2016 1029   GLUCOSEU NEGATIVE 01/17/2016 1029   GLUCOSEU NEG mg/dL 16/03/9603 5409   HGBUR TRACE (A) 01/17/2016 1029   BILIRUBINUR NEGATIVE 01/17/2016 1029   KETONESUR NEGATIVE 01/17/2016 1029   PROTEINUR  100 (A) 01/17/2016 1029   UROBILINOGEN 0.2 08/08/2014 1445   NITRITE NEGATIVE 01/17/2016 1029   LEUKOCYTESUR NEGATIVE 01/17/2016 1029   Sepsis Labs: @LABRCNTIP (procalcitonin:4,lacticidven:4)  ) Recent Results (from the past 240 hour(s))  Blood culture (routine x 2)     Status: None (Preliminary result)   Collection Time: 04/04/16  2:26 PM  Result Value Ref Range Status   Specimen Description BLOOD RIGHT ARM  Final   Special Requests BOTTLES DRAWN AEROBIC AND ANAEROBIC 6CC  Final   Culture NO GROWTH 2 DAYS  Final   Report Status PENDING  Incomplete  Blood culture (routine x 2)     Status: None (Preliminary result)   Collection Time: 04/04/16  3:05 PM  Result Value Ref Range Status   Specimen Description BLOOD RIGHT ARM  Final   Special Requests BOTTLES DRAWN AEROBIC AND ANAEROBIC 6CC  Final  Culture NO GROWTH 2 DAYS  Final   Report Status PENDING  Incomplete     Invalid input(s): PROCALCITONIN, LACTICACIDVEN   Radiology Studies: Dg Chest Port 1 View  Result Date: 04/04/2016 CLINICAL DATA:  Chest pain and shortness of breath for 2 days EXAM: PORTABLE CHEST 1 VIEW COMPARISON:  April 02, 2016 FINDINGS: Bilateral pulmonary opacities are identified. The opacities are more focal and prominent in the right mid and lower lung versus the left. No pneumothorax. Cardiomegaly. The hila and mediastinum are unremarkable. No other acute abnormalities IMPRESSION: Cardiomegaly and mild edema. The more focal patchy opacity in the right lung versus the left could represent asymmetric edema but is at least moderately worrisome for a superimposed multi focal pneumonia. Electronically Signed   By: Gerome Samavid  Williams III M.D   On: 04/04/2016 12:39        Scheduled Meds: . atorvastatin  80 mg Oral QHS  . azithromycin  500 mg Intravenous Q24H  . carvedilol  25 mg Oral BID WC  . cefTRIAXone (ROCEPHIN)  IV  1 g Intravenous Q24H  . furosemide  80 mg Intravenous BID  . lisinopril  5 mg Oral Daily  .  rivaroxaban  20 mg Oral Q breakfast  . sodium chloride flush  3 mL Intravenous Q12H  . spironolactone  50 mg Oral Daily   Continuous Infusions:     LOS: 2 days    Time spent: 35 minutes    Shaylea Ucci A, MD [facility] Pager [phone]  If 7PM-7AM, please contact night-coverage [email or web address] Password [email or web address] 04/06/2016, 11:28 AM

## 2016-04-07 LAB — BASIC METABOLIC PANEL
Anion gap: 9 (ref 5–15)
BUN: 26 mg/dL — AB (ref 6–20)
CALCIUM: 8.5 mg/dL — AB (ref 8.9–10.3)
CO2: 25 mmol/L (ref 22–32)
CREATININE: 1.24 mg/dL (ref 0.61–1.24)
Chloride: 105 mmol/L (ref 101–111)
GFR calc Af Amer: >60 mL/min (ref >60)
Glucose, Bld: 87 mg/dL (ref 65–99)
POTASSIUM: 3.4 mmol/L — AB (ref 3.5–5.1)
SODIUM: 139 mmol/L (ref 135–145)

## 2016-04-07 LAB — CBC
HEMATOCRIT: 31 % — AB (ref 39.0–52.0)
Hemoglobin: 9.6 g/dL — ABNORMAL LOW (ref 13.0–17.0)
MCH: 25.7 pg — ABNORMAL LOW (ref 26.0–34.0)
MCHC: 31 g/dL (ref 30.0–36.0)
MCV: 83.1 fL (ref 78.0–100.0)
PLATELETS: 237 10*3/uL (ref 150–400)
RBC: 3.73 MIL/uL — ABNORMAL LOW (ref 4.22–5.81)
RDW: 15.9 % — AB (ref 11.5–15.5)
WBC: 3.3 10*3/uL — AB (ref 4.0–10.5)

## 2016-04-07 MED ORDER — SPIRONOLACTONE 50 MG PO TABS: 50.0000 mg | ORAL_TABLET | Freq: Every day | ORAL | 0 refills | Status: DC

## 2016-04-07 MED ORDER — METOLAZONE 2.5 MG PO TABS: 2.5000 mg | ORAL_TABLET | Freq: Every day | ORAL | 0 refills | Status: DC

## 2016-04-07 MED ORDER — CARVEDILOL 25 MG PO TABS: 25.0000 mg | ORAL_TABLET | Freq: Two times a day (BID) | ORAL | 1 refills | Status: DC

## 2016-04-07 MED ORDER — ATORVASTATIN CALCIUM 40 MG PO TABS: 40.0000 mg | ORAL_TABLET | Freq: Every day | ORAL | 0 refills | Status: DC

## 2016-04-07 MED ORDER — POTASSIUM CHLORIDE CRYS ER 20 MEQ PO TBCR
60.0000 meq | EXTENDED_RELEASE_TABLET | Freq: Once | ORAL | Status: AC
Start: 2016-04-07 — End: 2016-04-07
  Administered 2016-04-07: 60 meq via ORAL
  Filled 2016-04-07: qty 3

## 2016-04-07 MED ORDER — FUROSEMIDE 80 MG PO TABS: 80.0000 mg | ORAL_TABLET | Freq: Two times a day (BID) | ORAL | 1 refills | Status: DC

## 2016-04-07 MED ORDER — LEVOFLOXACIN 750 MG PO TABS: 750.0000 mg | ORAL_TABLET | Freq: Every day | ORAL | 0 refills | Status: DC

## 2016-04-07 NOTE — Discharge Summary (Signed)
Physician Discharge Summary  [name]:[id number] DOB: 09/28/1965 DOA: 04/04/2016  PCP: Samuel Grippe, MD  Admit date: 04/04/2016 Discharge date: 04/07/2016  Admitted From: Home Disposition: Home  Recommendations for Outpatient Follow-up:  1. Follow up with PCP in 1-2 weeks 2. Please obtain BMP/CBC in one week  Home Health: NA Equipment/Devices: DME hospital bed, secondary to acute on chronic systolic CHF  Discharge Condition: Stable CODE STATUS: Full Diet recommendation: Heart Healthy  Brief/Interim Summary: Samuel T Archeris a 50 y.o.malewith medical history significant of CHF LVEF was 20-25%, A. fib, HTN and chronic lower extremity edema. Patient came to the hospital with shortness of breath. Patient comes to the hospital very frequently and per previous notes has history of nonadherence to treatment. Reported for the past 2 days worsening SOB, orthopnea, DOE so came into the hospital for further evaluation. He was found to be febrile with temp of 101 as well  Discharge Diagnoses:  Principal Problem:   Systolic and diastolic CHF, acute on chronic (HCC) Active Problems:   Edema of right lower extremity   Atrial fibrillation with rapid ventricular response (HCC)   Acute respiratory failure with hypoxia (HCC)   Elevated troponin   CAP (community acquired pneumonia)   Acute on chronic combined systolic and diastolic CHF -Patient presented with shortness of breath, elevated BNP, chest x-ray with congestion. -2-D echo in April 2017 showed LVEF of 20-25%. -Findings consistent with acute exacerbation of CHF, started on IV Lasix, follow BMP closely. -Intake/output and follow daily weight. -Toprol-XL switched to Coreg for better rate control, reported he is allergic to diltiazem and hydralazine. -Discharged on Coreg, lisinopril, Lasix, metolazone and Aldactone.  Nonischemic cardiomyopathy -Cardiac catheterization was done in December 2016 showed nonischemic  cardiomyopathy. -His LVEF is 20-25%, patient was evaluated by cardiology in August for ICD [facility] trial recommendation). -Cardiology recommended optimal medical therapy and then to evaluate for ICD as patient showed consistent nonadherence. -Patient placed at some point on Enteresto but he was not compliant with it, needs to follow-up with cardiology as outpatient. -Consider referral to advanced heart failure clinic for consideration of Enteresto and ICD.  Comment acquired pneumonia -Has temperature of 101.4, chest x-ray not sure which is edema versus pneumonia. -Started on Rocephin and azithromycin will continue. -Flu PCR negative, Tamiflu discontinued, and discharged on 4 more days of Levaquin to complete 7 days of antibiotics.  Atrial fibrillation with rapid ventricular response -CHA2DS2-VASc is 3 for HTN, CAD and CHF, patient is on Xarelto. -Rate is not controlled likely secondary to CHF exacerbation, started on Coreg. -Reported allergy to it isn't, rate is controlled now with Coreg and after the fluid status improved.  Hypokalemia -Is likely secondary to aggressive diuresis, replete with oral supplements, Aldactone dose increase.  Acute respiratory failure with hypoxia -Secondary to exacerbation of CHF plus or minus pneumonia. -This is resolved, patient satting 100% on room air.  History of nonadherence to medical therapy -I spoke with the patient for prolonged period of time. -I called his mother Mrs. [name] harsher smoker for a prolonged period of time as well. -Mother reported that he takes his medications but not able to go to his PCP because he is almost always in the hospital. -I pressed on the importance of follow-up with PCP and cardiology   Discharge Instructions  Discharge Instructions    Diet - low sodium heart healthy    Complete by:  As directed    Increase activity slowly    Complete by:  As directed  Medication List    STOP taking these  medications   metoprolol succinate 25 MG 24 hr tablet Commonly known as:  TOPROL-XL     TAKE these medications   atorvastatin 40 MG tablet Commonly known as:  LIPITOR Take 1 tablet (40 mg total) by mouth daily at 6 PM. What changed:  medication strength  how much to take  when to take this   beclomethasone 80 MCG/ACT inhaler Commonly known as:  QVAR Inhale 2 puffs into the lungs 2 (two) times daily as needed (for shortness of breath).   carvedilol 25 MG tablet Commonly known as:  COREG Take 1 tablet (25 mg total) by mouth 2 (two) times daily with a meal.   furosemide 80 MG tablet Commonly known as:  LASIX Take 1 tablet (80 mg total) by mouth 2 (two) times daily.   levofloxacin 750 MG tablet Commonly known as:  LEVAQUIN Take 1 tablet (750 mg total) by mouth daily.   lisinopril 5 MG tablet Commonly known as:  PRINIVIL,ZESTRIL Take 1 tablet (5 mg total) by mouth daily.   metolazone 2.5 MG tablet Commonly known as:  ZAROXOLYN Take 1 tablet (2.5 mg total) by mouth daily.   potassium chloride SA 20 MEQ tablet Commonly known as:  K-DUR,KLOR-CON Take 40 mEq by mouth daily.   rivaroxaban 20 MG Tabs tablet Commonly known as:  XARELTO Take 20 mg by mouth daily with breakfast.   spironolactone 50 MG tablet Commonly known as:  ALDACTONE Take 1 tablet (50 mg total) by mouth daily. What changed:  medication strength  how much to take       Allergies  Allergen Reactions  . Bee Venom Shortness Of Breath, Swelling and Other (See Comments)    Reaction:  Facial swelling  . Food Shortness Of Breath, Rash and Other (See Comments)    Pt states that he is allergic to strawberries.    . Lipitor [Atorvastatin] Shortness Of Breath and Other (See Comments)    Reaction:  Nose bleeds   . Aspirin Other (See Comments)    Reaction:  GI upset   . Banana Diarrhea  . Orange Juice [Orange Oil] Nausea And Vomiting  . Torsemide Swelling and Other (See Comments)    Reaction:   Swelling of feet/legs   . Diltiazem Palpitations  . Hydralazine Palpitations    Consultations:  None   Procedures/Studies: Dg Chest 2 View  Result Date: 03/30/2016 CLINICAL DATA:  Short of breath EXAM: CHEST  2 VIEW COMPARISON:  03/28/2016 FINDINGS: Cardiac enlargement. Pulmonary vascular congestion with interstitial edema unchanged from the prior study. No pleural effusion. IMPRESSION: Congestive heart failure with interstitial edema unchanged from 2 days ago. Electronically Signed   By: Marlan Palau M.D.   On: 03/30/2016 13:29   Dg Chest 2 View  Result Date: 03/28/2016 CLINICAL DATA:  Swelling in hands and lower extremities. History of CHF. Shortness of breath. EXAM: CHEST  2 VIEW COMPARISON:  March 26, 2016 FINDINGS: Stable cardiomegaly. The hila and mediastinum are normal. No pneumothorax. No pulmonary nodules or masses. No focal infiltrates. Mild edema. IMPRESSION: Mild edema and cardiomegaly. Electronically Signed   By: Gerome Sam III M.D   On: 03/28/2016 19:05   Dg Chest 2 View  Result Date: 03/24/2016 CLINICAL DATA:  Mid chest pressure with shortness of breath. Bilateral leg swelling. EXAM: CHEST  2 VIEW COMPARISON:  03/21/2016 and 03/20/2016 FINDINGS: Stable cardiomegaly. The trachea is midline. Central vascular structures are prominent for size but unchanged. Small amount  of fluid or thickening along the fissures. No large pleural effusions. No focal airspace disease. IMPRESSION: Stable cardiomegaly with vascular congestion. No overt pulmonary edema. Findings are similar to the previous examination. Electronically Signed   By: Richarda Overlie M.D.   On: 03/24/2016 20:41   Dg Chest 2 View  Result Date: 03/21/2016 CLINICAL DATA:  Short of breath.  Leg swelling. EXAM: CHEST  2 VIEW COMPARISON:  03/20/2016 FINDINGS: Mild enlargement of the cardiac silhouette, stable. No mediastinal or hilar masses or evidence of adenopathy. Lungs are clear.  No pleural effusion.  No pneumothorax.  Bony thorax is intact. IMPRESSION: 1. No acute cardiopulmonary disease. 2. Cardiomegaly.  No convincing congestive heart failure. Electronically Signed   By: Amie Portland M.D.   On: 03/21/2016 09:00   Dg Chest Port 1 View  Result Date: 04/04/2016 CLINICAL DATA:  Chest pain and shortness of breath for 2 days EXAM: PORTABLE CHEST 1 VIEW COMPARISON:  April 02, 2016 FINDINGS: Bilateral pulmonary opacities are identified. The opacities are more focal and prominent in the right mid and lower lung versus the left. No pneumothorax. Cardiomegaly. The hila and mediastinum are unremarkable. No other acute abnormalities IMPRESSION: Cardiomegaly and mild edema. The more focal patchy opacity in the right lung versus the left could represent asymmetric edema but is at least moderately worrisome for a superimposed multi focal pneumonia. Electronically Signed   By: Gerome Sam III M.D   On: 04/04/2016 12:39   (Echo, Carotid, EGD, Colonoscopy, ERCP)    Subjective:   Discharge Exam: Vitals:   04/06/16 2058 04/07/16 0450  BP: 114/83 (!) 150/91  Pulse: 78 71  Resp: 20 19  Temp: 97.6 F (36.4 C) 97.4 F (36.3 C)   Vitals:   04/06/16 1459 04/06/16 2058 04/07/16 0300 04/07/16 0450  BP: 101/71 114/83  (!) 150/91  Pulse: 88 78  71  Resp: (!) 24 20  19   Temp:  97.6 F (36.4 C)  97.4 F (36.3 C)  TempSrc:  Oral  Oral  SpO2: 100% 99%  99%  Weight:   131.4 kg (289 lb 11.2 oz)   Height:        General: Pt is alert, awake, not in acute distress Cardiovascular: RRR, S1/S2 +, no rubs, no gallops Respiratory: CTA bilaterally, no wheezing, no rhonchi Abdominal: Soft, NT, ND, bowel sounds + Extremities: no edema, no cyanosis    The results of significant diagnostics from this hospitalization (including imaging, microbiology, ancillary and laboratory) are listed below for reference.     Microbiology: Recent Results (from the past 240 hour(s))  Blood culture (routine x 2)     Status: None  (Preliminary result)   Collection Time: 04/04/16  2:26 PM  Result Value Ref Range Status   Specimen Description BLOOD RIGHT ARM  Final   Special Requests BOTTLES DRAWN AEROBIC AND ANAEROBIC 6CC  Final   Culture NO GROWTH 3 DAYS  Final   Report Status PENDING  Incomplete  Blood culture (routine x 2)     Status: None (Preliminary result)   Collection Time: 04/04/16  3:05 PM  Result Value Ref Range Status   Specimen Description BLOOD RIGHT ARM  Final   Special Requests BOTTLES DRAWN AEROBIC AND ANAEROBIC 6CC  Final   Culture NO GROWTH 3 DAYS  Final   Report Status PENDING  Incomplete     Labs: BNP (last 3 results)  Recent Labs  03/28/16 1850 03/30/16 1231 04/04/16 1235  BNP 473.0* 589.0* 663.0*   Basic  Metabolic Panel:  Recent Labs Lab 04/04/16 1235 04/05/16 0541 04/06/16 0519 04/07/16 0635  NA 140 139 139 139  K 3.2* 3.2* 3.6 3.4*  CL 106 107 107 105  CO2 28 25 26 25   GLUCOSE 89 91 87 87  BUN 18 18 25* 26*  CREATININE 1.17 1.07 1.24 1.24  CALCIUM 8.6* 8.2* 8.3* 8.5*  MG  --   --  2.2  --    Liver Function Tests: No results for input(s): AST, ALT, ALKPHOS, BILITOT, PROT, ALBUMIN in the last 168 hours. No results for input(s): LIPASE, AMYLASE in the last 168 hours. No results for input(s): AMMONIA in the last 168 hours. CBC:  Recent Labs Lab 04/04/16 1235 04/05/16 0541 04/06/16 0519 04/07/16 0635  WBC 6.2 6.6 3.7* 3.3*  HGB 9.6* 9.0* 9.3* 9.6*  HCT 31.4* 29.0* 30.0* 31.0*  MCV 82.8 82.4 82.6 83.1  PLT 215 212 215 237   Cardiac Enzymes:  Recent Labs Lab 04/04/16 1235  TROPONINI 0.03*   BNP: Invalid input(s): POCBNP CBG: No results for input(s): GLUCAP in the last 168 hours. D-Dimer No results for input(s): DDIMER in the last 72 hours. Hgb A1c  Recent Labs  04/04/16 1235  HGBA1C 5.7*   Lipid Profile No results for input(s): CHOL, HDL, LDLCALC, TRIG, CHOLHDL, LDLDIRECT in the last 72 hours. Thyroid function studies  Recent Labs   04/04/16 1235  TSH 1.240   Anemia work up No results for input(s): VITAMINB12, FOLATE, FERRITIN, TIBC, IRON, RETICCTPCT in the last 72 hours. Urinalysis    Component Value Date/Time   COLORURINE YELLOW 01/17/2016 1029   APPEARANCEUR CLEAR 01/17/2016 1029   LABSPEC 1.020 01/17/2016 1029   PHURINE 6.0 01/17/2016 1029   GLUCOSEU NEGATIVE 01/17/2016 1029   GLUCOSEU NEG mg/dL 16/10/960408/10/2009 [id number]   HGBUR TRACE (A) 01/17/2016 1029   BILIRUBINUR NEGATIVE 01/17/2016 1029   KETONESUR NEGATIVE 01/17/2016 1029   PROTEINUR 100 (A) 01/17/2016 1029   UROBILINOGEN 0.2 08/08/2014 1445   NITRITE NEGATIVE 01/17/2016 1029   LEUKOCYTESUR NEGATIVE 01/17/2016 1029   Sepsis Labs Invalid input(s): PROCALCITONIN,  WBC,  LACTICIDVEN Microbiology Recent Results (from the past 240 hour(s))  Blood culture (routine x 2)     Status: None (Preliminary result)   Collection Time: 04/04/16  2:26 PM  Result Value Ref Range Status   Specimen Description BLOOD RIGHT ARM  Final   Special Requests BOTTLES DRAWN AEROBIC AND ANAEROBIC 6CC  Final   Culture NO GROWTH 3 DAYS  Final   Report Status PENDING  Incomplete  Blood culture (routine x 2)     Status: None (Preliminary result)   Collection Time: 04/04/16  3:05 PM  Result Value Ref Range Status   Specimen Description BLOOD RIGHT ARM  Final   Special Requests BOTTLES DRAWN AEROBIC AND ANAEROBIC 6CC  Final   Culture NO GROWTH 3 DAYS  Final   Report Status PENDING  Incomplete     Time coordinating discharge: Over 30 minutes  SIGNED:   Clint LippsELMAHI,Tayron Hunnell A, MD  [facility] 04/07/2016, 10:29 AM Pager   If 7PM-7AM, please contact night-coverage [email or web address] Password [email or web address]

## 2016-04-07 NOTE — Telephone Encounter (Signed)
Opened in error

## 2016-04-09 LAB — CULTURE, BLOOD (ROUTINE X 2)
Culture: NO GROWTH
Culture: NO GROWTH

## 2016-04-14 IMAGING — CR DG CHEST 1V PORT
1 series · 1 of 1 positions shown · non-contrast
Comparison: 06/28/2014

CLINICAL DATA: Productive cough for 3 months

EXAM:
PORTABLE CHEST - 1 VIEW

[portable]
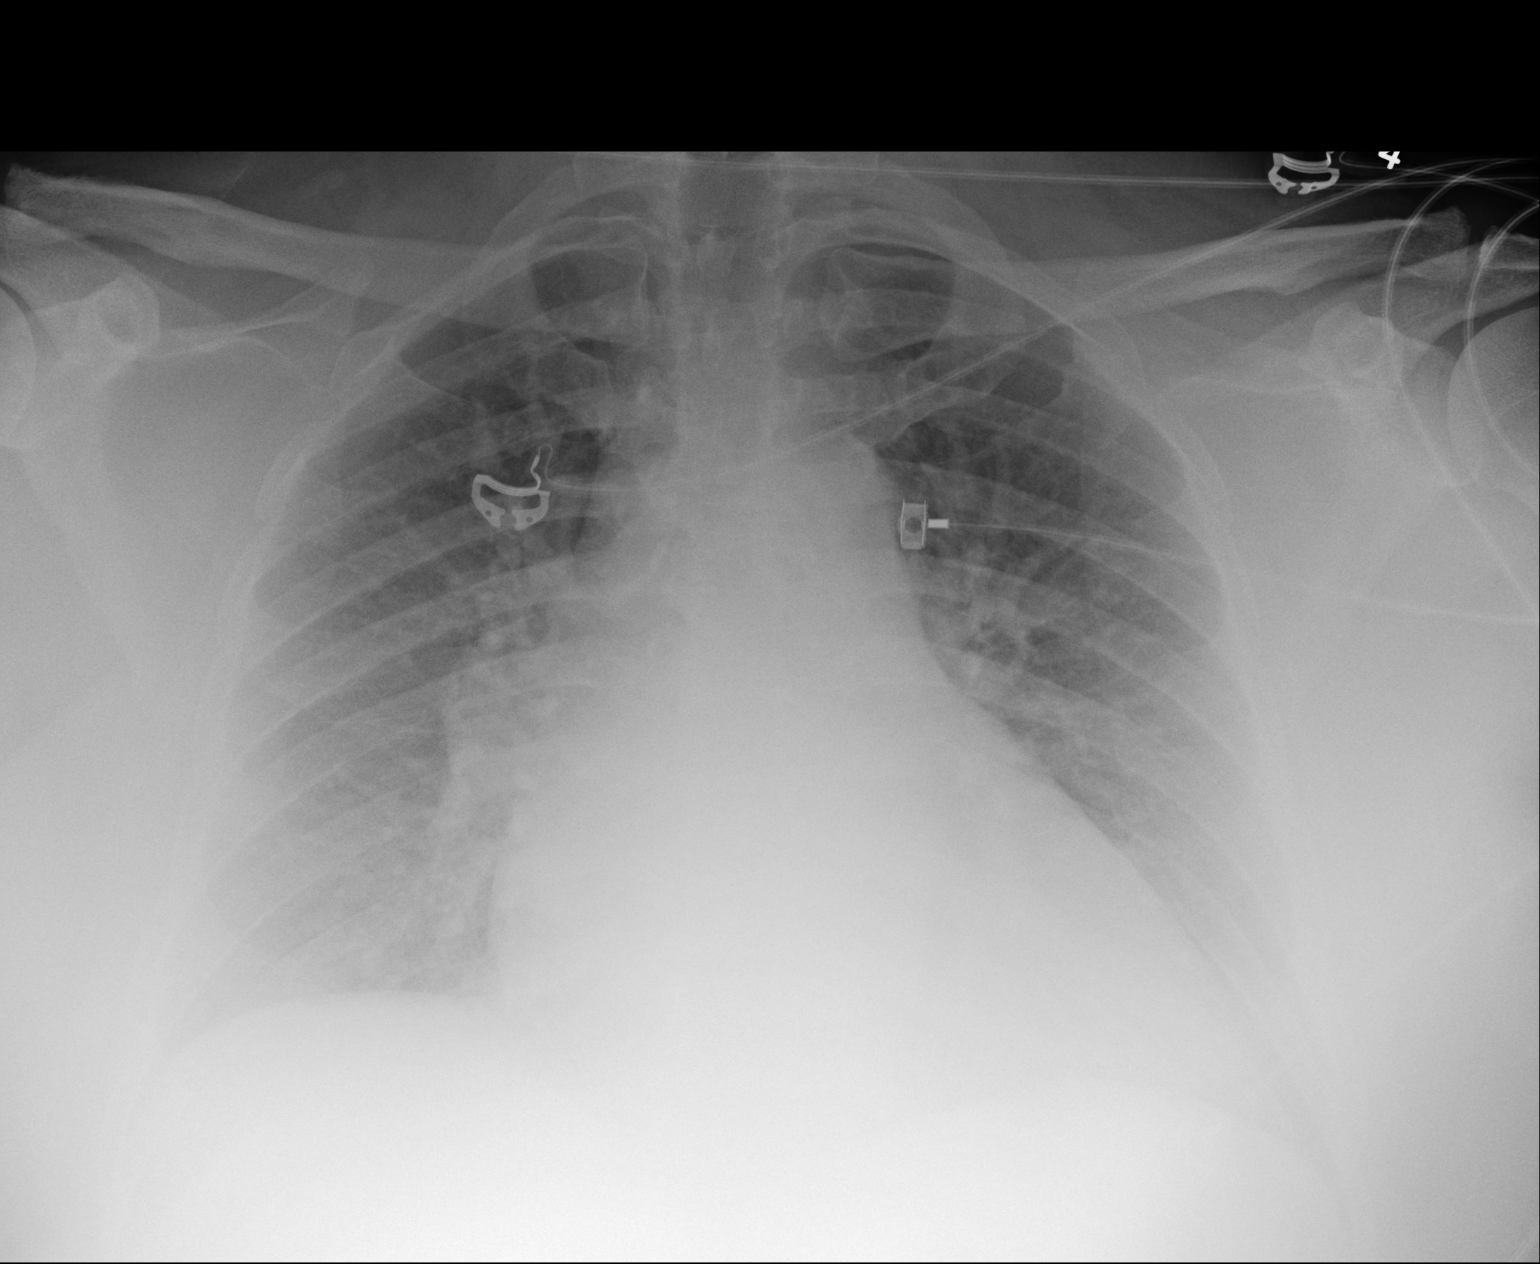

[1 of 1 positions shown; findings below may reference images not displayed]

FINDINGS: Cardiac shadow is enlarged. Mild vascular congestion is noted but
improved from the prior study. No parenchymal edema is noted. No
infiltrate is seen.
IMPRESSION: Mild vascular congestion without focal infiltrate or edema.

## 2016-04-22 ENCOUNTER — Emergency Department (HOSPITAL_COMMUNITY): Payer: Medicare Other

## 2016-04-22 ENCOUNTER — Encounter (HOSPITAL_COMMUNITY): Payer: Self-pay | Admitting: Emergency Medicine

## 2016-04-22 ENCOUNTER — Emergency Department (HOSPITAL_COMMUNITY)
Admission: EM | Admit: 2016-04-22 | Discharge: 2016-04-23 | Disposition: A | Discharge status: Home / Self Care | Payer: Medicare Other | Attending: Emergency Medicine | Admitting: Emergency Medicine

## 2016-04-22 DIAGNOSIS — I10 Essential (primary) hypertension: Secondary | ICD-10-CM

## 2016-04-22 DIAGNOSIS — J45909 Unspecified asthma, uncomplicated: Secondary | ICD-10-CM | POA: Insufficient documentation

## 2016-04-22 DIAGNOSIS — R0602 Shortness of breath: Secondary | ICD-10-CM | POA: Diagnosis present

## 2016-04-22 DIAGNOSIS — I4819 Other persistent atrial fibrillation: Secondary | ICD-10-CM

## 2016-04-22 DIAGNOSIS — Z87891 Personal history of nicotine dependence: Secondary | ICD-10-CM | POA: Diagnosis not present

## 2016-04-22 DIAGNOSIS — I110 Hypertensive heart disease with heart failure: Secondary | ICD-10-CM | POA: Insufficient documentation

## 2016-04-22 DIAGNOSIS — Z79899 Other long term (current) drug therapy: Secondary | ICD-10-CM | POA: Insufficient documentation

## 2016-04-22 DIAGNOSIS — I509 Heart failure, unspecified: Secondary | ICD-10-CM

## 2016-04-22 DIAGNOSIS — I481 Persistent atrial fibrillation: Secondary | ICD-10-CM | POA: Diagnosis not present

## 2016-04-22 LAB — CBC WITH DIFFERENTIAL/PLATELET
BASOS PCT: 1 %
Basophils Absolute: 0 10*3/uL (ref 0.0–0.1)
Eosinophils Absolute: 0.1 10*3/uL (ref 0.0–0.7)
Eosinophils Relative: 2 %
HEMATOCRIT: 32.9 % — AB (ref 39.0–52.0)
HEMOGLOBIN: 10.2 g/dL — AB (ref 13.0–17.0)
LYMPHS ABS: 0.7 10*3/uL (ref 0.7–4.0)
Lymphocytes Relative: 17 %
MCH: 25.6 pg — ABNORMAL LOW (ref 26.0–34.0)
MCHC: 31 g/dL (ref 30.0–36.0)
MCV: 82.7 fL (ref 78.0–100.0)
MONOS PCT: 7 %
Monocytes Absolute: 0.3 10*3/uL (ref 0.1–1.0)
NEUTROS ABS: 3.1 10*3/uL (ref 1.7–7.7)
NEUTROS PCT: 73 %
Platelets: 216 10*3/uL (ref 150–400)
RBC: 3.98 MIL/uL — AB (ref 4.22–5.81)
RDW: 15.9 % — ABNORMAL HIGH (ref 11.5–15.5)
WBC: 4.2 10*3/uL (ref 4.0–10.5)

## 2016-04-22 LAB — URINALYSIS, ROUTINE W REFLEX MICROSCOPIC
Bilirubin Urine: NEGATIVE
GLUCOSE, UA: NEGATIVE mg/dL
Hgb urine dipstick: NEGATIVE
KETONES UR: NEGATIVE mg/dL
LEUKOCYTES UA: NEGATIVE
NITRITE: NEGATIVE
PROTEIN: NEGATIVE mg/dL
Specific Gravity, Urine: 1.01 (ref 1.005–1.030)
pH: 6 (ref 5.0–8.0)

## 2016-04-22 LAB — COMPREHENSIVE METABOLIC PANEL
ALT: 20 U/L (ref 17–63)
ANION GAP: 6 (ref 5–15)
AST: 23 U/L (ref 15–41)
Albumin: 3.8 g/dL (ref 3.5–5.0)
Alkaline Phosphatase: 73 U/L (ref 38–126)
BUN: 19 mg/dL (ref 6–20)
CALCIUM: 8.8 mg/dL — AB (ref 8.9–10.3)
CHLORIDE: 108 mmol/L (ref 101–111)
CO2: 24 mmol/L (ref 22–32)
Creatinine, Ser: 1 mg/dL (ref 0.61–1.24)
Glucose, Bld: 92 mg/dL (ref 65–99)
Potassium: 3.8 mmol/L (ref 3.5–5.1)
SODIUM: 138 mmol/L (ref 135–145)
Total Bilirubin: 1.7 mg/dL — ABNORMAL HIGH (ref 0.3–1.2)
Total Protein: 6.9 g/dL (ref 6.5–8.1)

## 2016-04-22 LAB — BRAIN NATRIURETIC PEPTIDE: B NATRIURETIC PEPTIDE 5: 775 pg/mL — AB (ref 0.0–100.0)

## 2016-04-22 LAB — TROPONIN I: TROPONIN I: 0.03 ng/mL — AB (ref <0.03)

## 2016-04-22 MED ORDER — FUROSEMIDE 10 MG/ML IJ SOLN
80.0000 mg | Freq: Once | INTRAMUSCULAR | Status: AC
  Administered 2016-04-22: 80 mg via INTRAVENOUS
  Filled 2016-04-22: qty 8

## 2016-04-22 MED ORDER — LABETALOL HCL 5 MG/ML IV SOLN
10.0000 mg | Freq: Once | INTRAVENOUS | Status: AC
  Administered 2016-04-22: 10 mg via INTRAVENOUS
  Filled 2016-04-22: qty 4

## 2016-04-22 NOTE — ED Notes (Signed)
O2 99 when walking

## 2016-04-22 NOTE — ED Provider Notes (Signed)
[facility]Y DEPT Provider Note   CSN: [id number] Arrival date & time: 04/22/16  1909     History   Chief Complaint Chief Complaint  Patient presents with  . Shortness of Breath    HPI [name] is a 50 y.o. male.  Pt presents to the ED today with sob.  He has a hx of CHF with nonischemic cardiomyopathy.  Last EF 20-25%.  Pt hospitalized 10/14-10/17 with CAP.  He denies any fever.  He also has a hx of medical noncompliance, but said he has been taking his medications.  Pt said that he's gained fluid weight.  Pt also has a hx of a.fib (CHADVASC 2 for htn and chf hx).  Pt is on xarelto.      Past Medical History:  Diagnosis Date  . Allergic rhinitis   . Asthma   . Atrial fibrillation (HCC)   . CHF (congestive heart failure) (HCC)   . Essential hypertension   . Gastroesophageal reflux disease   . Heart attack   . History of cardiac catheterization    Normal coronaries December 2016  . Noncompliance    Major problem leading to declining health and recurrent hospitalization  . Nonischemic cardiomyopathy (HCC)    LVEF 20-25%  . OSA (obstructive sleep apnea)   . Osteoarthritis   . Peptic ulcer disease     Patient Active Problem List   Diagnosis Date Noted  . CAP (community acquired pneumonia) 04/04/2016  . Acute on chronic congestive heart failure (HCC)   . Coronary arteries, normal Dec 2016 01/23/2016  . Chronic anticoagulation-Xarelto 01/23/2016  . Chronic renal insufficiency, stage III (moderate) 01/23/2016  . NSVT (nonsustained ventricular tachycardia) (HCC) 01/23/2016  . CHF (congestive heart failure) (HCC) 01/20/2016  . CHF exacerbation (HCC) 01/20/2016  . Acute systolic CHF (congestive heart failure) (HCC) 12/21/2015  . Acute on chronic combined systolic and diastolic CHF (congestive heart failure) (HCC) 12/21/2015  . Elevated troponin 12/21/2015  . Congestive heart failure (CHF) (HCC) 12/03/2015  . Systolic and diastolic CHF, acute on chronic (HCC)     . Nonischemic cardiomyopathy (HCC)   . Morbid obesity due to excess calories (HCC)   . Noncompliance with diet and medication regimen 12/02/2015  . Acute on chronic systolic congestive heart failure (HCC)   . Acute diastolic CHF (congestive heart failure) (HCC) 11/18/2015  . Acute renal failure superimposed on stage 3 chronic kidney disease (HCC) 10/28/2015  . Acute on chronic combined systolic and diastolic heart failure (HCC) 10/08/2015  . OSA (obstructive sleep apnea) 09/20/2015  . Pulmonary hypertension 09/19/2015  . Leukopenia 09/19/2015  . Normocytic anemia 09/19/2015  . Chronic atrial fibrillation (HCC)   . Dyspnea on exertion 05/28/2015  . Acute respiratory failure with hypoxia (HCC) 04/01/2015  . Pneumonia 04/01/2015  . Hypokalemia 04/01/2015  . Obesity hypoventilation syndrome (HCC) 08/08/2014  . GERD (gastroesophageal reflux disease) 08/08/2014  . Noncompliance with medication regimen 06/28/2014  . Atrial fibrillation with rapid ventricular response (HCC) 06/28/2014  . Edema of right lower extremity 06/21/2014  . Fecal urgency 06/21/2014  . Asthma 02/11/2009  . Hyperlipidemia 07/12/2008  . OBESITY, MORBID 07/12/2008  . Anxiety state 07/12/2008  . DEPRESSION 07/12/2008  . Essential hypertension 07/12/2008  . ALLERGIC RHINITIS 07/12/2008  . GERD 07/12/2008  . Peptic ulcer 07/12/2008  . OSTEOARTHRITIS 07/12/2008    Past Surgical History:  Procedure Laterality Date  . CARDIAC CATHETERIZATION N/A 06/14/2015   Procedure: Right/Left Heart Cath and Coronary Angiography;  Surgeon: Runell GessJonathan J Berry, MD;  Location: [facility] INVASIVE CV [facility];  Service: Cardiovascular;  Laterality: N/A;       Home Medications    Prior to Admission medications   Medication Sig Start Date End Date Taking? Authorizing Provider  atorvastatin (LIPITOR) 40 MG tablet Take 1 tablet (40 mg total) by mouth daily at 6 PM. 04/07/16  Yes Clydia Llano, MD  beclomethasone (QVAR) 80 MCG/ACT inhaler Inhale 2  puffs into the lungs 2 (two) times daily as needed (for shortness of breath).   Yes Historical Provider, MD  carvedilol (COREG) 25 MG tablet Take 1 tablet (25 mg total) by mouth 2 (two) times daily with a meal. 04/07/16  Yes Clydia Llano, MD  furosemide (LASIX) 80 MG tablet Take 1 tablet (80 mg total) by mouth 2 (two) times daily. 04/07/16  Yes Clydia Llano, MD  lisinopril (PRINIVIL,ZESTRIL) 5 MG tablet Take 1 tablet (5 mg total) by mouth daily. 01/17/16  Yes Glynn Octave, MD  metoprolol succinate (TOPROL-XL) 25 MG 24 hr tablet Take 75 mg by mouth 2 (two) times daily. 03/18/16  Yes Historical Provider, MD  potassium chloride SA (K-DUR,KLOR-CON) 20 MEQ tablet Take 40 mEq by mouth daily.    Yes Historical Provider, MD  rivaroxaban (XARELTO) 20 MG TABS tablet Take 20 mg by mouth daily with breakfast.   Yes Historical Provider, MD  spironolactone (ALDACTONE) 50 MG tablet Take 1 tablet (50 mg total) by mouth daily. 04/07/16  Yes Clydia Llano, MD  levofloxacin (LEVAQUIN) 750 MG tablet Take 1 tablet (750 mg total) by mouth daily. Patient not taking: Reported on 04/22/2016 04/07/16   Clydia Llano, MD  metolazone (ZAROXOLYN) 2.5 MG tablet Take 1 tablet (2.5 mg total) by mouth daily. Patient not taking: Reported on 04/22/2016 04/07/16   Clydia Llano, MD    Family History Family History  Problem Relation Age of Onset  . Stroke Father   . Heart attack Father   . Aneurysm Mother     Cerebral aneurysm  . Hypertension Sister   . Colon cancer Neg Hx   . Inflammatory bowel disease Neg Hx   . Liver disease Neg Hx     Social History Social History  Substance Use Topics  . Smoking status: Former Smoker    Packs/day: 0.50    Years: 20.00    Types: Cigarettes    Start date: 04/26/1988    Quit date: 06/23/2007  . Smokeless tobacco: Never Used     Comment: 1 ppd former smoker  . Alcohol use No     Comment: No etoh since 2009     Allergies   Bee venom; Food; Lipitor [atorvastatin]; Aspirin; Banana;  Metolazone; Orange juice [orange oil]; Torsemide; Diltiazem; and Hydralazine   Review of Systems Review of Systems  Respiratory: Positive for shortness of breath.      Physical Exam Updated Vital Signs BP (!) 153/117   Pulse 74   Temp 98.1 F (36.7 C) (Oral)   Resp 20   Ht 6' (1.829 m)   Wt 300 lb (136.1 kg)   SpO2 98%   BMI 40.69 kg/m   Physical Exam  Constitutional: He is oriented to person, place, and time. He appears well-developed and well-nourished.  HENT:  Head: Normocephalic and atraumatic.  Right Ear: External ear normal.  Left Ear: External ear normal.  Nose: Nose normal.  Mouth/Throat: Oropharynx is clear and moist.  Eyes: Conjunctivae and EOM are normal. Pupils are equal, round, and reactive to light.  Neck: Normal range of motion. Neck supple.  Cardiovascular: Normal heart sounds and intact distal pulses.  An irregularly irregular rhythm present. Tachycardia present.   Pulmonary/Chest: Effort normal. He has rales.  Abdominal: Soft. Bowel sounds are normal.  Musculoskeletal: Normal range of motion. He exhibits edema.  Neurological: He is alert and oriented to person, place, and time.  Skin: Skin is warm.  Psychiatric: He has a normal mood and affect. His behavior is normal. Judgment and thought content normal.  Nursing note and vitals reviewed.    ED Treatments / Results  Labs (all labs ordered are listed, but only abnormal results are displayed) Labs Reviewed  COMPREHENSIVE METABOLIC PANEL - Abnormal; Notable for the following:       Result Value   Calcium 8.8 (*)    Total Bilirubin 1.7 (*)    All other components within normal limits  CBC WITH DIFFERENTIAL/PLATELET - Abnormal; Notable for the following:    RBC 3.98 (*)    Hemoglobin 10.2 (*)    HCT 32.9 (*)    MCH 25.6 (*)    RDW 15.9 (*)    All other components within normal limits  TROPONIN I - Abnormal; Notable for the following:    Troponin I 0.03 (*)    All other components within normal  limits  BRAIN NATRIURETIC PEPTIDE - Abnormal; Notable for the following:    B Natriuretic Peptide 775.0 (*)    All other components within normal limits  URINALYSIS, ROUTINE W REFLEX MICROSCOPIC (NOT AT [facility])    EKG  EKG Interpretation  Date/Time:  Wednesday April 22 2016 19:20:47 EDT Ventricular Rate:  97 PR Interval:    QRS Duration: 98 QT Interval:  362 QTC Calculation: 460 R Axis:   69 Text Interpretation:  Atrial fibrillation Nonspecific repol abnormality, lateral leads Confirmed by Harleigh Civello MD, Darrion Macaulay ([id number]) on 04/22/2016 7:46:59 PM       Radiology Dg Chest 2 View  Result Date: 04/22/2016 CLINICAL DATA:  Worsening shortness of breath for 3 days. Congestive heart failure. Previous myocardial infarct. Nonischemic cardiomyopathy. EXAM: CHEST  2 VIEW COMPARISON:  04/12/2016 FINDINGS: Mild cardiomegaly remains stable. Mild diffuse interstitial infiltrates are seen, consistent with interstitial edema. This appears decreased since previous study. No evidence of pulmonary consolidation or pleural effusion. IMPRESSION: Mild cardiomegaly and diffuse interstitial edema, consistent with mild congestive heart failure. No evidence of pulmonary consolidation or pleural effusion. Electronically Signed   By: Myles Rosenthal M.D.   On: 04/22/2016 19:55    Procedures Procedures (including critical care time)  Medications Ordered in ED Medications  furosemide (LASIX) injection 80 mg (80 mg Intravenous Given 04/22/16 2136)  labetalol (NORMODYNE,TRANDATE) injection 10 mg (10 mg Intravenous Given 04/22/16 2137)     Initial Impression / Assessment and Plan / ED Course  I have reviewed the triage vital signs and the nursing notes.  Pertinent labs & imaging results that were available during my care of the patient were reviewed by me and considered in my medical decision making (see chart for details).  Clinical Course    Pt ambulated and oxygen saturations stayed at 99%.  Pt wants to stay in  the hospital, but he is oxygenating well.  I don't have criteria to admit him.  His mild elevation in troponin is chronic.  He said that he's been taking his medications, but I have a strong suspicion that he is not.  He is instructed to call his doctor tomorrow.  He knows to return if worse.  Final Clinical Impressions(s) / ED Diagnoses  Final diagnoses:  Chronic congestive heart failure, unspecified congestive heart failure type [facility])  Essential hypertension  Persistent atrial fibrillation [facility])    New Prescriptions New Prescriptions   No medications on file     Jacalyn Lefevre, MD 04/22/16 2343

## 2016-04-22 NOTE — ED Triage Notes (Signed)
Increased sob x past 3 days  

## 2016-04-22 NOTE — ED Notes (Addendum)
CRITICAL VALUE ALERT  Critical value received:  Troponin = 0.03  Date of notification:  04/22/16  Time of notification:  2044  Critical value read back:Yes.    Nurse who received alert:  Antony Odea, RN  MD notified (1st page):  Dr. Particia Nearing  Time of first page:  2045  MD notified (2nd page):  Time of second page:  Responding MD:  Particia Nearing  Time MD responded:  2046

## 2016-04-22 NOTE — Discharge Instructions (Signed)
Take your evening medications when you get home.  Make sure you are taking your medications as directed.

## 2016-05-01 ENCOUNTER — Emergency Department (HOSPITAL_COMMUNITY)
Admission: EM | Admit: 2016-05-01 | Discharge: 2016-05-02 | Disposition: A | Discharge status: Home / Self Care | Payer: Medicare Other | Attending: Emergency Medicine | Admitting: Emergency Medicine

## 2016-05-01 ENCOUNTER — Emergency Department (HOSPITAL_COMMUNITY): Payer: Medicare Other

## 2016-05-01 ENCOUNTER — Encounter (HOSPITAL_COMMUNITY): Payer: Self-pay

## 2016-05-01 DIAGNOSIS — R0602 Shortness of breath: Secondary | ICD-10-CM | POA: Diagnosis not present

## 2016-05-01 DIAGNOSIS — Z87891 Personal history of nicotine dependence: Secondary | ICD-10-CM | POA: Diagnosis not present

## 2016-05-01 DIAGNOSIS — I130 Hypertensive heart and chronic kidney disease with heart failure and stage 1 through stage 4 chronic kidney disease, or unspecified chronic kidney disease: Secondary | ICD-10-CM | POA: Insufficient documentation

## 2016-05-01 DIAGNOSIS — J45909 Unspecified asthma, uncomplicated: Secondary | ICD-10-CM | POA: Insufficient documentation

## 2016-05-01 DIAGNOSIS — I5043 Acute on chronic combined systolic (congestive) and diastolic (congestive) heart failure: Secondary | ICD-10-CM | POA: Insufficient documentation

## 2016-05-01 DIAGNOSIS — N183 Chronic kidney disease, stage 3 (moderate): Secondary | ICD-10-CM | POA: Diagnosis not present

## 2016-05-01 DIAGNOSIS — R0600 Dyspnea, unspecified: Secondary | ICD-10-CM

## 2016-05-01 DIAGNOSIS — Z79899 Other long term (current) drug therapy: Secondary | ICD-10-CM | POA: Diagnosis not present

## 2016-05-01 LAB — BASIC METABOLIC PANEL
ANION GAP: 6 (ref 5–15)
BUN: 15 mg/dL (ref 6–20)
CO2: 26 mmol/L (ref 22–32)
Calcium: 8.6 mg/dL — ABNORMAL LOW (ref 8.9–10.3)
Chloride: 104 mmol/L (ref 101–111)
Creatinine, Ser: 1.09 mg/dL (ref 0.61–1.24)
GFR calc non Af Amer: >60 mL/min (ref >60)
Glucose, Bld: 87 mg/dL (ref 65–99)
Potassium: 3.4 mmol/L — ABNORMAL LOW (ref 3.5–5.1)
SODIUM: 136 mmol/L (ref 135–145)

## 2016-05-01 LAB — URINALYSIS, ROUTINE W REFLEX MICROSCOPIC
BILIRUBIN URINE: NEGATIVE
Glucose, UA: NEGATIVE mg/dL
HGB URINE DIPSTICK: NEGATIVE
Ketones, ur: NEGATIVE mg/dL
Leukocytes, UA: NEGATIVE
NITRITE: NEGATIVE
PH: 6 (ref 5.0–8.0)
Protein, ur: 100 mg/dL — AB
SPECIFIC GRAVITY, URINE: 1.025 (ref 1.005–1.030)

## 2016-05-01 LAB — I-STAT TROPONIN, ED: Troponin i, poc: 0.02 ng/mL (ref 0.00–0.08)

## 2016-05-01 LAB — URINE MICROSCOPIC-ADD ON: RBC / HPF: NONE SEEN RBC/hpf (ref 0–5)

## 2016-05-01 LAB — CBC
HEMATOCRIT: 33.5 % — AB (ref 39.0–52.0)
Hemoglobin: 10.2 g/dL — ABNORMAL LOW (ref 13.0–17.0)
MCH: 25.2 pg — ABNORMAL LOW (ref 26.0–34.0)
MCHC: 30.4 g/dL (ref 30.0–36.0)
MCV: 82.9 fL (ref 78.0–100.0)
Platelets: 225 10*3/uL (ref 150–400)
RBC: 4.04 MIL/uL — ABNORMAL LOW (ref 4.22–5.81)
RDW: 16.3 % — AB (ref 11.5–15.5)
WBC: 4.3 10*3/uL (ref 4.0–10.5)

## 2016-05-01 LAB — TROPONIN I: Troponin I: 0.03 ng/mL (ref <0.03)

## 2016-05-01 MED ORDER — FUROSEMIDE 10 MG/ML IJ SOLN
40.0000 mg | Freq: Once | INTRAMUSCULAR | Status: AC
  Administered 2016-05-01: 40 mg via INTRAVENOUS
  Filled 2016-05-01: qty 4

## 2016-05-01 MED ORDER — FUROSEMIDE 10 MG/ML IJ SOLN
20.0000 mg | Freq: Once | INTRAMUSCULAR | Status: AC
  Administered 2016-05-01: 20 mg via INTRAVENOUS
  Filled 2016-05-01: qty 2

## 2016-05-01 NOTE — ED Notes (Signed)
EKG given to Dr. Cook  

## 2016-05-01 NOTE — ED Notes (Signed)
CRITICAL VALUE ALERT  Critical value received:  Troponin 0.03  Date of notification:  05/01/16  Time of notification:  1910  Critical value read back:Yes.    Nurse who received alert:  bkn  MD notified (1st page):  Adriana Simas  Time of first page:  1912  MD notified (2nd page):  Time of second page:  Responding MD:    Time MD responded:

## 2016-05-01 NOTE — ED Triage Notes (Signed)
Patient complains of increasing shortness of breath for the past 2 days but much worse today. States fluid in his legs are increasing. States he is taking his meds and is urinating but fluid is building up despite taking his meds.

## 2016-05-01 NOTE — ED Notes (Signed)
MD at bedside. 

## 2016-05-01 NOTE — Discharge Instructions (Signed)
You have a mild amount of fluid in your lung. You have been  given intravenous Lasix today. You can take an occasional Lasix tablet if you feel that your fluid overloaded. Follow-up with your primary care doctor.

## 2016-05-01 NOTE — ED Provider Notes (Signed)
[facility] Provider Note   CSN: [id number] Arrival date & time: 05/01/16  1740     History   Chief Complaint Chief Complaint  Patient presents with  . Shortness of Breath    HPI RANGEL ECHEVERRI is a 50 y.o. male.  Patient with known chronic subacute heart failure and peripheral edema presents with mild shortness of breath for the past 2 days. He has been taking Bumex with fluid collection. He has a history of nonischemic cardiomyopathy with an injection fraction of 20-25%. Status post pneumonia in October 2017. No chest pain, fever, sweats, chills      Past Medical History:  Diagnosis Date  . Allergic rhinitis   . Asthma   . Atrial fibrillation (HCC)   . CHF (congestive heart failure) (HCC)   . Essential hypertension   . Gastroesophageal reflux disease   . Heart attack   . History of cardiac catheterization    Normal coronaries December 2016  . Noncompliance    Major problem leading to declining health and recurrent hospitalization  . Nonischemic cardiomyopathy (HCC)    LVEF 20-25%  . OSA (obstructive sleep apnea)   . Osteoarthritis   . Peptic ulcer disease     Patient Active Problem List   Diagnosis Date Noted  . CAP (community acquired pneumonia) 04/04/2016  . Acute on chronic congestive heart failure (HCC)   . Coronary arteries, normal Dec 2016 01/23/2016  . Chronic anticoagulation-Xarelto 01/23/2016  . Chronic renal insufficiency, stage III (moderate) 01/23/2016  . NSVT (nonsustained ventricular tachycardia) (HCC) 01/23/2016  . CHF (congestive heart failure) (HCC) 01/20/2016  . CHF exacerbation (HCC) 01/20/2016  . Acute systolic CHF (congestive heart failure) (HCC) 12/21/2015  . Acute on chronic combined systolic and diastolic CHF (congestive heart failure) (HCC) 12/21/2015  . Elevated troponin 12/21/2015  . Congestive heart failure (CHF) (HCC) 12/03/2015  . Systolic and diastolic CHF, acute on chronic (HCC)   . Nonischemic cardiomyopathy (HCC)     . Morbid obesity due to excess calories (HCC)   . Noncompliance with diet and medication regimen 12/02/2015  . Acute on chronic systolic congestive heart failure (HCC)   . Acute diastolic CHF (congestive heart failure) (HCC) 11/18/2015  . Acute renal failure superimposed on stage 3 chronic kidney disease (HCC) 10/28/2015  . Acute on chronic combined systolic and diastolic heart failure (HCC) 10/08/2015  . OSA (obstructive sleep apnea) 09/20/2015  . Pulmonary hypertension 09/19/2015  . Leukopenia 09/19/2015  . Normocytic anemia 09/19/2015  . Chronic atrial fibrillation (HCC)   . Dyspnea on exertion 05/28/2015  . Acute respiratory failure with hypoxia (HCC) 04/01/2015  . Pneumonia 04/01/2015  . Hypokalemia 04/01/2015  . Obesity hypoventilation syndrome (HCC) 08/08/2014  . GERD (gastroesophageal reflux disease) 08/08/2014  . Noncompliance with medication regimen 06/28/2014  . Atrial fibrillation with rapid ventricular response (HCC) 06/28/2014  . Edema of right lower extremity 06/21/2014  . Fecal urgency 06/21/2014  . Asthma 02/11/2009  . Hyperlipidemia 07/12/2008  . OBESITY, MORBID 07/12/2008  . Anxiety state 07/12/2008  . DEPRESSION 07/12/2008  . Essential hypertension 07/12/2008  . ALLERGIC RHINITIS 07/12/2008  . GERD 07/12/2008  . Peptic ulcer 07/12/2008  . OSTEOARTHRITIS 07/12/2008    Past Surgical History:  Procedure Laterality Date  . CARDIAC CATHETERIZATION N/A 06/14/2015   Procedure: Right/Left Heart Cath and Coronary Angiography;  Surgeon: Runell Gess, MD;  Location: [facility] INVASIVE CV LAB;  Service: Cardiovascular;  Laterality: N/A;       Home Medications    Prior to  Admission medications   Medication Sig Start Date End Date Taking? Authorizing Provider  atorvastatin (LIPITOR) 40 MG tablet Take 1 tablet (40 mg total) by mouth daily at 6 PM. 04/07/16  Yes Clydia LlanoMutaz Elmahi, MD  beclomethasone (QVAR) 80 MCG/ACT inhaler Inhale 2 puffs into the lungs 2 (two) times  daily as needed (for shortness of breath).   Yes Historical Provider, MD  bumetanide (BUMEX) 2 MG tablet Take 2 mg by mouth daily. 04/27/16  Yes Historical Provider, MD  carvedilol (COREG) 25 MG tablet Take 1 tablet (25 mg total) by mouth 2 (two) times daily with a meal. 04/07/16  Yes Clydia LlanoMutaz Elmahi, MD  docusate sodium (COLACE) 100 MG capsule Take 100 mg by mouth 2 (two) times daily.   Yes Historical Provider, MD  ferrous gluconate (FERGON) 324 MG tablet Take 324 mg by mouth 2 (two) times daily with a meal.   Yes Historical Provider, MD  furosemide (LASIX) 80 MG tablet Take 1 tablet (80 mg total) by mouth 2 (two) times daily. 04/07/16  Yes Clydia LlanoMutaz Elmahi, MD  metoprolol succinate (TOPROL-XL) 25 MG 24 hr tablet Take 75 mg by mouth 2 (two) times daily. 03/18/16  Yes Historical Provider, MD  potassium chloride SA (K-DUR,KLOR-CON) 20 MEQ tablet Take 40 mEq by mouth daily.    Yes Historical Provider, MD  rivaroxaban (XARELTO) 20 MG TABS tablet Take 20 mg by mouth daily with breakfast.   Yes Historical Provider, MD  spironolactone (ALDACTONE) 50 MG tablet Take 1 tablet (50 mg total) by mouth daily. 04/07/16  Yes Clydia LlanoMutaz Elmahi, MD  levofloxacin (LEVAQUIN) 750 MG tablet Take 1 tablet (750 mg total) by mouth daily. Patient not taking: Reported on 04/22/2016 04/07/16   Clydia LlanoMutaz Elmahi, MD  lisinopril (PRINIVIL,ZESTRIL) 5 MG tablet Take 1 tablet (5 mg total) by mouth daily. Patient not taking: Reported on 05/01/2016 01/17/16   Glynn OctaveStephen Rancour, MD  metolazone (ZAROXOLYN) 2.5 MG tablet Take 1 tablet (2.5 mg total) by mouth daily. Patient not taking: Reported on 04/22/2016 04/07/16   Clydia LlanoMutaz Elmahi, MD    Family History Family History  Problem Relation Age of Onset  . Stroke Father   . Heart attack Father   . Aneurysm Mother     Cerebral aneurysm  . Hypertension Sister   . Colon cancer Neg Hx   . Inflammatory bowel disease Neg Hx   . Liver disease Neg Hx     Social History Social History  Substance Use Topics    . Smoking status: Former Smoker    Packs/day: 0.50    Years: 20.00    Types: Cigarettes    Start date: 04/26/1988    Quit date: 06/23/2007  . Smokeless tobacco: Never Used     Comment: 1 ppd former smoker  . Alcohol use No     Comment: No etoh since 2009     Allergies   Bee venom; Food; Lipitor [atorvastatin]; Aspirin; Banana; Metolazone; Orange juice [orange oil]; Torsemide; Diltiazem; and Hydralazine   Review of Systems Review of Systems  All other systems reviewed and are negative.    Physical Exam Updated Vital Signs BP 151/100   Pulse 64   Temp 98.7 F (37.1 C) (Oral)   Resp (!) 31   SpO2 96%   Physical Exam  Constitutional: He is oriented to person, place, and time.  Sunglasses on, obese, no obvious respiratory distress  HENT:  Head: Normocephalic and atraumatic.  Eyes: Conjunctivae are normal.  Neck: Neck supple.  Cardiovascular: Normal rate and regular  rhythm.   Pulmonary/Chest: Effort normal.  Abdominal: Soft. Bowel sounds are normal.  Musculoskeletal: Normal range of motion.  Neurological: He is alert and oriented to person, place, and time.  Skin: Skin is warm and dry.  Psychiatric: He has a normal mood and affect. His behavior is normal.  Nursing note and vitals reviewed.    ED Treatments / Results  Labs (all labs ordered are listed, but only abnormal results are displayed) Labs Reviewed  BASIC METABOLIC PANEL - Abnormal; Notable for the following:       Result Value   Potassium 3.4 (*)    Calcium 8.6 (*)    All other components within normal limits  CBC - Abnormal; Notable for the following:    RBC 4.04 (*)    Hemoglobin 10.2 (*)    HCT 33.5 (*)    MCH 25.2 (*)    RDW 16.3 (*)    All other components within normal limits  TROPONIN I - Abnormal; Notable for the following:    Troponin I 0.03 (*)    All other components within normal limits  URINALYSIS, ROUTINE W REFLEX MICROSCOPIC (NOT AT [facility]) - Abnormal; Notable for the following:     Protein, ur 100 (*)    All other components within normal limits  URINE MICROSCOPIC-ADD ON - Abnormal; Notable for the following:    Squamous Epithelial / LPF 0-5 (*)    Bacteria, UA FEW (*)    All other components within normal limits  I-STAT TROPOININ, ED    EKG  EKG Interpretation  Date/Time:  Friday May 01 2016 18:37:05 EST Ventricular Rate:  94 PR Interval:    QRS Duration: 90 QT Interval:  372 QTC Calculation: 465 R Axis:   63 Text Interpretation:  Atrial fibrillation with premature ventricular or aberrantly conducted complexes Nonspecific ST and T wave abnormality Prolonged QT Abnormal ECG Confirmed by Adriana Simas  MD, Rally Ouch ([id number]) on 05/01/2016 8:10:48 PM       Radiology Dg Chest 2 View  Result Date: 05/01/2016 CLINICAL DATA:  Patient with shortness of breath. EXAM: CHEST  2 VIEW COMPARISON:  Chest radiograph 04/26/2016. FINDINGS: Monitoring leads overlie the patient. Marked cardiomegaly. Pulmonary vascular redistribution and mild interstitial pulmonary edema. No definite pleural effusion. Thoracic spine degenerative changes. IMPRESSION: Marked cardiomegaly with pulmonary vascular redistribution and mild interstitial edema. Electronically Signed   By: Annia Belt M.D.   On: 05/01/2016 20:02    Procedures Procedures (including critical care time)  Medications Ordered in ED Medications  furosemide (LASIX) injection 40 mg (40 mg Intravenous Given 05/01/16 2105)  furosemide (LASIX) injection 20 mg (20 mg Intravenous Given 05/01/16 2320)     Initial Impression / Assessment and Plan / ED Course  I have reviewed the triage vital signs and the nursing notes.  Pertinent labs & imaging results that were available during my care of the patient were reviewed by me and considered in my medical decision making (see chart for details).  Clinical Course     Chest x-ray shows mild interstitial edema. Patient is hemodynamically stable. He is not dyspneic or tachypneic.  IV Lasix  given. Patient has primary care follow-up.  Final Clinical Impressions(s) / ED Diagnoses   Final diagnoses:  Dyspnea, unspecified type    New Prescriptions New Prescriptions   No medications on file     Donnetta Hutching, MD 05/01/16 2343

## 2016-05-17 ENCOUNTER — Inpatient Hospital Stay (HOSPITAL_COMMUNITY)
Admission: EM | Admit: 2016-05-17 | Discharge: 2016-05-22 | DRG: 291 | Disposition: A | Discharge status: Home / Self Care | Payer: Medicare Other | Attending: Internal Medicine | Admitting: Internal Medicine

## 2016-05-17 ENCOUNTER — Encounter (HOSPITAL_COMMUNITY): Payer: Self-pay | Admitting: *Deleted

## 2016-05-17 ENCOUNTER — Emergency Department (HOSPITAL_COMMUNITY): Payer: Medicare Other

## 2016-05-17 DIAGNOSIS — I428 Other cardiomyopathies: Secondary | ICD-10-CM

## 2016-05-17 DIAGNOSIS — Z91018 Allergy to other foods: Secondary | ICD-10-CM

## 2016-05-17 DIAGNOSIS — I482 Chronic atrial fibrillation, unspecified: Secondary | ICD-10-CM | POA: Diagnosis present

## 2016-05-17 DIAGNOSIS — I509 Heart failure, unspecified: Secondary | ICD-10-CM

## 2016-05-17 DIAGNOSIS — Z6841 Body Mass Index (BMI) 40.0 and over, adult: Secondary | ICD-10-CM | POA: Diagnosis not present

## 2016-05-17 DIAGNOSIS — Z7901 Long term (current) use of anticoagulants: Secondary | ICD-10-CM

## 2016-05-17 DIAGNOSIS — Z87891 Personal history of nicotine dependence: Secondary | ICD-10-CM | POA: Diagnosis not present

## 2016-05-17 DIAGNOSIS — J449 Chronic obstructive pulmonary disease, unspecified: Secondary | ICD-10-CM | POA: Diagnosis present

## 2016-05-17 DIAGNOSIS — I130 Hypertensive heart and chronic kidney disease with heart failure and stage 1 through stage 4 chronic kidney disease, or unspecified chronic kidney disease: Secondary | ICD-10-CM | POA: Diagnosis present

## 2016-05-17 DIAGNOSIS — K219 Gastro-esophageal reflux disease without esophagitis: Secondary | ICD-10-CM | POA: Diagnosis present

## 2016-05-17 DIAGNOSIS — Z823 Family history of stroke: Secondary | ICD-10-CM | POA: Diagnosis not present

## 2016-05-17 DIAGNOSIS — N183 Chronic kidney disease, stage 3 unspecified: Secondary | ICD-10-CM | POA: Diagnosis present

## 2016-05-17 DIAGNOSIS — Z8249 Family history of ischemic heart disease and other diseases of the circulatory system: Secondary | ICD-10-CM | POA: Diagnosis not present

## 2016-05-17 DIAGNOSIS — J9601 Acute respiratory failure with hypoxia: Secondary | ICD-10-CM | POA: Diagnosis not present

## 2016-05-17 DIAGNOSIS — J9621 Acute and chronic respiratory failure with hypoxia: Secondary | ICD-10-CM | POA: Diagnosis present

## 2016-05-17 DIAGNOSIS — I5043 Acute on chronic combined systolic (congestive) and diastolic (congestive) heart failure: Secondary | ICD-10-CM | POA: Diagnosis not present

## 2016-05-17 DIAGNOSIS — Z886 Allergy status to analgesic agent status: Secondary | ICD-10-CM | POA: Diagnosis not present

## 2016-05-17 DIAGNOSIS — I252 Old myocardial infarction: Secondary | ICD-10-CM | POA: Diagnosis not present

## 2016-05-17 DIAGNOSIS — Z8711 Personal history of peptic ulcer disease: Secondary | ICD-10-CM

## 2016-05-17 DIAGNOSIS — I4891 Unspecified atrial fibrillation: Secondary | ICD-10-CM | POA: Diagnosis present

## 2016-05-17 DIAGNOSIS — N182 Chronic kidney disease, stage 2 (mild): Secondary | ICD-10-CM | POA: Diagnosis present

## 2016-05-17 DIAGNOSIS — Z9111 Patient's noncompliance with dietary regimen: Secondary | ICD-10-CM | POA: Diagnosis not present

## 2016-05-17 DIAGNOSIS — Z9119 Patient's noncompliance with other medical treatment and regimen: Secondary | ICD-10-CM | POA: Diagnosis not present

## 2016-05-17 DIAGNOSIS — M1990 Unspecified osteoarthritis, unspecified site: Secondary | ICD-10-CM | POA: Diagnosis present

## 2016-05-17 DIAGNOSIS — Z888 Allergy status to other drugs, medicaments and biological substances status: Secondary | ICD-10-CM | POA: Diagnosis not present

## 2016-05-17 DIAGNOSIS — I429 Cardiomyopathy, unspecified: Secondary | ICD-10-CM | POA: Diagnosis present

## 2016-05-17 DIAGNOSIS — D509 Iron deficiency anemia, unspecified: Secondary | ICD-10-CM | POA: Diagnosis present

## 2016-05-17 DIAGNOSIS — D649 Anemia, unspecified: Secondary | ICD-10-CM | POA: Diagnosis present

## 2016-05-17 DIAGNOSIS — Z91030 Bee allergy status: Secondary | ICD-10-CM | POA: Diagnosis not present

## 2016-05-17 DIAGNOSIS — Z9114 Patient's other noncompliance with medication regimen: Secondary | ICD-10-CM

## 2016-05-17 DIAGNOSIS — I10 Essential (primary) hypertension: Secondary | ICD-10-CM

## 2016-05-17 DIAGNOSIS — R0602 Shortness of breath: Secondary | ICD-10-CM | POA: Diagnosis present

## 2016-05-17 LAB — CBC WITH DIFFERENTIAL/PLATELET
BASOS ABS: 0 10*3/uL (ref 0.0–0.1)
BASOS PCT: 0 %
EOS ABS: 0.1 10*3/uL (ref 0.0–0.7)
Eosinophils Relative: 1 %
HCT: 33.1 % — ABNORMAL LOW (ref 39.0–52.0)
Hemoglobin: 10 g/dL — ABNORMAL LOW (ref 13.0–17.0)
Lymphocytes Relative: 22 %
Lymphs Abs: 1 10*3/uL (ref 0.7–4.0)
MCH: 24.9 pg — AB (ref 26.0–34.0)
MCHC: 30.2 g/dL (ref 30.0–36.0)
MCV: 82.3 fL (ref 78.0–100.0)
Monocytes Absolute: 0.4 10*3/uL (ref 0.1–1.0)
Monocytes Relative: 9 %
NEUTROS ABS: 3.3 10*3/uL (ref 1.7–7.7)
NEUTROS PCT: 68 %
PLATELETS: 267 10*3/uL (ref 150–400)
RBC: 4.02 MIL/uL — ABNORMAL LOW (ref 4.22–5.81)
RDW: 16.3 % — ABNORMAL HIGH (ref 11.5–15.5)
WBC: 4.8 10*3/uL (ref 4.0–10.5)

## 2016-05-17 LAB — BASIC METABOLIC PANEL
ANION GAP: 7 (ref 5–15)
BUN: 22 mg/dL — ABNORMAL HIGH (ref 6–20)
CALCIUM: 8.5 mg/dL — AB (ref 8.9–10.3)
CO2: 23 mmol/L (ref 22–32)
Chloride: 109 mmol/L (ref 101–111)
Creatinine, Ser: 1.1 mg/dL (ref 0.61–1.24)
Glucose, Bld: 97 mg/dL (ref 65–99)
POTASSIUM: 3.5 mmol/L (ref 3.5–5.1)
SODIUM: 139 mmol/L (ref 135–145)

## 2016-05-17 LAB — BRAIN NATRIURETIC PEPTIDE: B Natriuretic Peptide: 455 pg/mL — ABNORMAL HIGH (ref 0.0–100.0)

## 2016-05-17 LAB — TROPONIN I

## 2016-05-17 MED ORDER — ACETAMINOPHEN 325 MG PO TABS: 650.0000 mg | ORAL_TABLET | ORAL | Status: DC | PRN

## 2016-05-17 MED ORDER — ATORVASTATIN CALCIUM 40 MG PO TABS
40.0000 mg | ORAL_TABLET | Freq: Every day | ORAL | Status: DC
  Administered 2016-05-17 – 2016-05-21 (×5): 40 mg via ORAL
  Filled 2016-05-17 (×6): qty 1

## 2016-05-17 MED ORDER — POTASSIUM CHLORIDE CRYS ER 20 MEQ PO TBCR
EXTENDED_RELEASE_TABLET | ORAL | Status: AC
  Administered 2016-05-17: 20 meq
  Filled 2016-05-17: qty 2

## 2016-05-17 MED ORDER — FUROSEMIDE 10 MG/ML IJ SOLN
80.0000 mg | Freq: Two times a day (BID) | INTRAMUSCULAR | Status: DC
  Administered 2016-05-18 – 2016-05-22 (×9): 80 mg via INTRAVENOUS
  Filled 2016-05-17 (×9): qty 8

## 2016-05-17 MED ORDER — SPIRONOLACTONE 25 MG PO TABS
50.0000 mg | ORAL_TABLET | Freq: Every day | ORAL | Status: DC
  Administered 2016-05-18 – 2016-05-22 (×5): 50 mg via ORAL
  Filled 2016-05-17 (×5): qty 2

## 2016-05-17 MED ORDER — DOCUSATE SODIUM 100 MG PO CAPS
100.0000 mg | ORAL_CAPSULE | Freq: Two times a day (BID) | ORAL | Status: DC
  Administered 2016-05-17 – 2016-05-22 (×10): 100 mg via ORAL
  Filled 2016-05-17 (×10): qty 1

## 2016-05-17 MED ORDER — POTASSIUM CHLORIDE CRYS ER 20 MEQ PO TBCR
40.0000 meq | EXTENDED_RELEASE_TABLET | Freq: Every day | ORAL | Status: DC
  Administered 2016-05-17 – 2016-05-22 (×6): 40 meq via ORAL
  Filled 2016-05-17 (×6): qty 2

## 2016-05-17 MED ORDER — DILTIAZEM HCL 25 MG/5ML IV SOLN: 20.0000 mg | Freq: Once | INTRAVENOUS | Status: DC | PRN

## 2016-05-17 MED ORDER — CARVEDILOL 12.5 MG PO TABS
25.0000 mg | ORAL_TABLET | Freq: Two times a day (BID) | ORAL | Status: DC
  Administered 2016-05-17 – 2016-05-22 (×10): 25 mg via ORAL
  Filled 2016-05-17 (×11): qty 2

## 2016-05-17 MED ORDER — CARVEDILOL 12.5 MG PO TABS: 25.0000 mg | ORAL_TABLET | Freq: Two times a day (BID) | ORAL | Status: DC

## 2016-05-17 MED ORDER — FUROSEMIDE 10 MG/ML IJ SOLN
80.0000 mg | Freq: Once | INTRAMUSCULAR | Status: AC
  Administered 2016-05-17: 80 mg via INTRAVENOUS
  Filled 2016-05-17: qty 8

## 2016-05-17 MED ORDER — FERROUS GLUCONATE 324 (38 FE) MG PO TABS
324.0000 mg | ORAL_TABLET | Freq: Two times a day (BID) | ORAL | Status: DC
  Administered 2016-05-18 – 2016-05-22 (×9): 324 mg via ORAL
  Filled 2016-05-17 (×17): qty 1

## 2016-05-17 MED ORDER — SODIUM CHLORIDE 0.9% FLUSH
3.0000 mL | Freq: Two times a day (BID) | INTRAVENOUS | Status: DC
  Administered 2016-05-17 – 2016-05-21 (×9): 3 mL via INTRAVENOUS

## 2016-05-17 MED ORDER — ENOXAPARIN SODIUM 40 MG/0.4ML ~~LOC~~ SOLN: 40.0000 mg | SUBCUTANEOUS | Status: DC

## 2016-05-17 MED ORDER — IPRATROPIUM-ALBUTEROL 0.5-2.5 (3) MG/3ML IN SOLN
3.0000 mL | Freq: Once | RESPIRATORY_TRACT | Status: AC
  Administered 2016-05-17: 3 mL via RESPIRATORY_TRACT
  Filled 2016-05-17: qty 3

## 2016-05-17 MED ORDER — BECLOMETHASONE DIPROPIONATE 80 MCG/ACT IN AERS: 2.0000 | INHALATION_SPRAY | Freq: Two times a day (BID) | RESPIRATORY_TRACT | Status: DC | PRN

## 2016-05-17 MED ORDER — NITROGLYCERIN 2 % TD OINT
0.5000 [in_us] | TOPICAL_OINTMENT | Freq: Four times a day (QID) | TRANSDERMAL | Status: DC
  Administered 2016-05-17 – 2016-05-18 (×2): 0.5 [in_us] via TOPICAL
  Filled 2016-05-17 (×2): qty 1

## 2016-05-17 MED ORDER — RIVAROXABAN 20 MG PO TABS
20.0000 mg | ORAL_TABLET | Freq: Every day | ORAL | Status: DC
  Administered 2016-05-18 – 2016-05-22 (×5): 20 mg via ORAL
  Filled 2016-05-17 (×5): qty 1

## 2016-05-17 MED ORDER — SODIUM CHLORIDE 0.9% FLUSH: 3.0000 mL | INTRAVENOUS | Status: DC | PRN

## 2016-05-17 MED ORDER — BUDESONIDE 0.25 MG/2ML IN SUSP
0.2500 mg | Freq: Two times a day (BID) | RESPIRATORY_TRACT | Status: DC
  Administered 2016-05-17 – 2016-05-22 (×10): 0.25 mg via RESPIRATORY_TRACT
  Filled 2016-05-17 (×10): qty 2

## 2016-05-17 MED ORDER — SODIUM CHLORIDE 0.9 % IV SOLN: 250.0000 mL | INTRAVENOUS | Status: DC | PRN

## 2016-05-17 MED ORDER — ONDANSETRON HCL 4 MG/2ML IJ SOLN: 4.0000 mg | Freq: Four times a day (QID) | INTRAMUSCULAR | Status: DC | PRN

## 2016-05-17 NOTE — ED Notes (Signed)
Pt reports that he cannot take generic lasix- "It doesn't work" and then when he is discharged from the hospital, he goes to pick up his meds and he has generic lasix "it doesn't work"  He then does not take it- and returns to the hospital after becoming short of breath- Pt is encouraged to speak with both his physician, nurse at his physician's office and his social worker to see if he can get lasix in its' non generic form.   He also reports that each hospital visit makes him lose about 20 lbs.

## 2016-05-17 NOTE — ED Notes (Signed)
Hospitalist in to speak with pt 

## 2016-05-17 NOTE — ED Notes (Signed)
Call to floor for report- RN unaware she is getting pt and will speak with CN and call back

## 2016-05-17 NOTE — ED Notes (Signed)
ED Provider at bedside. 

## 2016-05-17 NOTE — H&P (Signed)
History and Physical    Samuel KlippelMark T Durkin ZOX:[id number] DOB: 02/06/1966 DOA: 05/17/2016  PCP: Pearson GrippeJames Kim, MD   Patient coming from: Home  Chief Complaint: Swelling, dyspnea, gen weakness  HPI: [name] is a 50 y.o. male with medical history significant for nonischemic cardiomyopathy with combined CHF, chronic atrial fibrillation, hypertension, iron deficiency anemia, and poor adherence to the treatment plan, now presenting to the emergency department with several days of progressive dyspnea, generalized weakness, and swelling. Patient reports that he had been in his usual state until the insidious development of increased bilateral lower extremity edema and exertional dyspnea with a generalized weakness. Symptoms have progressively worsened over the past 3 days with no chest pain or palpitations and no fever, chills, or significant cough. Patient reports adherence with his diuretics and anticoagulant. He denies any dietary indiscretion. He notes that his chronic lower extremity edema has progressed to extend up into his hips and abdomen, and he is also noted edema involving both hands developing over the past day. Does continue to make urine and denies any dysuria, hematuria, or flank pain. EMS was called at the patient's house and found him to be saturating 81% on room air. He was treated with albuterol and placed on 4 L/m of supplemental oxygen en route to the hospital.  ED Course: Upon arrival to the ED, patient is found to be afebrile, saturating well on 3 L/m supplemental oxygen, tachypneic, tachycardic, and hypertensive. EKG features a sinus tachycardia with rate 108, PVCs, and QTc of 503 ms. Diffuse interstitial edema prior study, and bilateral pleural effusions. Chemistry panel is largely unremarkable and CBC is notable for a stable normocytic anemia with hemoglobin of 10.0. Troponin is undetectable and BNP is elevated to 455. Patient was treated with DuoNeb and 80 mg IV Lasix in the ED. He has  begun to diurese appropriately and will be admitted to the telemetry unit for ongoing evaluation and management of acute on chronic combined CHF with acute hypoxic respiratory failure.  Review of Systems:  All other systems reviewed and apart from HPI, are negative.  Past Medical History:  Diagnosis Date  . Allergic rhinitis   . Asthma   . Atrial fibrillation (HCC)   . CHF (congestive heart failure) (HCC)   . Essential hypertension   . Gastroesophageal reflux disease   . Heart attack   . History of cardiac catheterization    Normal coronaries December 2016  . Noncompliance    Major problem leading to declining health and recurrent hospitalization  . Nonischemic cardiomyopathy (HCC)    LVEF 20-25%  . OSA (obstructive sleep apnea)   . Osteoarthritis   . Peptic ulcer disease     Past Surgical History:  Procedure Laterality Date  . CARDIAC CATHETERIZATION N/A 06/14/2015   Procedure: Right/Left Heart Cath and Coronary Angiography;  Surgeon: Runell GessJonathan J Berry, MD;  Location: [facility] INVASIVE CV LAB;  Service: Cardiovascular;  Laterality: N/A;     reports that he quit smoking about 8 years ago. His smoking use included Cigarettes. He started smoking about 28 years ago. He has a 10.00 pack-year smoking history. He has never used smokeless tobacco. He reports that he does not drink alcohol or use drugs.  Allergies  Allergen Reactions  . Bee Venom Shortness Of Breath, Swelling and Other (See Comments)    Reaction:  Facial swelling  . Food Shortness Of Breath, Rash and Other (See Comments)    Pt states that he is allergic to strawberries.    .Marland Kitchen  Lipitor [Atorvastatin] Shortness Of Breath and Other (See Comments)    Reaction:  Nose bleeds   . Aspirin Other (See Comments)    Reaction:  GI upset   . Banana Diarrhea  . Metolazone Other (See Comments)    Patient states that he stopped taking due to "heart attack" like symptoms  . Orange Juice [Orange Oil] Nausea And Vomiting  . Torsemide  Swelling and Other (See Comments)    Reaction:  Swelling of feet/legs   . Diltiazem Palpitations  . Hydralazine Palpitations    Family History  Problem Relation Age of Onset  . Stroke Father   . Heart attack Father   . Aneurysm Mother     Cerebral aneurysm  . Hypertension Sister   . Colon cancer Neg Hx   . Inflammatory bowel disease Neg Hx   . Liver disease Neg Hx      Prior to Admission medications   Medication Sig Start Date End Date Taking? Authorizing Provider  atorvastatin (LIPITOR) 40 MG tablet Take 1 tablet (40 mg total) by mouth daily at 6 PM. 04/07/16  Yes Clydia Llano, MD  beclomethasone (QVAR) 80 MCG/ACT inhaler Inhale 2 puffs into the lungs 2 (two) times daily as needed (for shortness of breath).   Yes Historical Provider, MD  bumetanide (BUMEX) 2 MG tablet Take 2 mg by mouth daily. 04/27/16  Yes Historical Provider, MD  carvedilol (COREG) 25 MG tablet Take 1 tablet (25 mg total) by mouth 2 (two) times daily with a meal. 04/07/16  Yes Clydia Llano, MD  docusate sodium (COLACE) 100 MG capsule Take 100 mg by mouth 2 (two) times daily.   Yes Historical Provider, MD  ferrous gluconate (FERGON) 324 MG tablet Take 324 mg by mouth 2 (two) times daily with a meal.   Yes Historical Provider, MD  furosemide (LASIX) 80 MG tablet Take 1 tablet (80 mg total) by mouth 2 (two) times daily. 04/07/16  Yes Clydia Llano, MD  potassium chloride SA (K-DUR,KLOR-CON) 20 MEQ tablet Take 40 mEq by mouth daily.    Yes Historical Provider, MD  rivaroxaban (XARELTO) 20 MG TABS tablet Take 20 mg by mouth daily with breakfast.   Yes Historical Provider, MD  spironolactone (ALDACTONE) 50 MG tablet Take 1 tablet (50 mg total) by mouth daily. 04/07/16  Yes Clydia Llano, MD  metolazone (ZAROXOLYN) 2.5 MG tablet Take 1 tablet (2.5 mg total) by mouth daily. Patient not taking: Reported on 04/22/2016 04/07/16   Clydia Llano, MD    Physical Exam: Vitals:   05/17/16 1900 05/17/16 1930 05/17/16 2000 05/17/16  2030  BP: (!) 166/116 (!) 185/113 (!) 146/108 (!) 160/108  Pulse: (!) 40 65 (!) 57   Resp: 20 21 22 26   Temp:      TempSrc:      SpO2: 100% 100% 100%   Weight:      Height:          Constitutional: Tachypneic, obese, wearing dark sunglasses Eyes: PERTLA, lids and conjunctivae normal ENMT: Mucous membranes are moist. Posterior pharynx clear of any exudate or lesions.   Neck: normal, supple, no masses, no thyromegaly Respiratory: Crackles throughout, increased WOB with accessory muscle recruitment, no pallor or cyanosis.   Cardiovascular: Rate ~100 and irregular. LE edema into bilateral hips; bilateral hand edema. Neck veins distended. Abdomen: Mild distension, no tenderness, no masses palpated. Bowel sounds normal.  Musculoskeletal: no clubbing / cyanosis. No joint deformity upper and lower extremities. Normal muscle tone.  Skin: no significant rashes,  lesions, ulcers. Warm, dry, well-perfused. Neurologic: CN 2-12 grossly intact. Sensation intact, DTR normal. Strength 5/5 in all 4 limbs.  Psychiatric: Normal judgment and insight. Alert and oriented x 3. Normal mood and affect.     Labs on Admission: I have personally reviewed following labs and imaging studies  CBC:  Recent Labs Lab 05/17/16 1814  WBC 4.8  NEUTROABS 3.3  HGB 10.0*  HCT 33.1*  MCV 82.3  PLT 267   Basic Metabolic Panel:  Recent Labs Lab 05/17/16 1814  NA 139  K 3.5  CL 109  CO2 23  GLUCOSE 97  BUN 22*  CREATININE 1.10  CALCIUM 8.5*   GFR: Estimated Creatinine Clearance: 117.3 mL/min (by C-G formula based on SCr of 1.1 mg/dL). Liver Function Tests: No results for input(s): AST, ALT, ALKPHOS, BILITOT, PROT, ALBUMIN in the last 168 hours. No results for input(s): LIPASE, AMYLASE in the last 168 hours. No results for input(s): AMMONIA in the last 168 hours. Coagulation Profile: No results for input(s): INR, PROTIME in the last 168 hours. Cardiac Enzymes:  Recent Labs Lab 05/17/16 1814    TROPONINI <0.03   BNP (last 3 results) No results for input(s): PROBNP in the last 8760 hours. HbA1C: No results for input(s): HGBA1C in the last 72 hours. CBG: No results for input(s): GLUCAP in the last 168 hours. Lipid Profile: No results for input(s): CHOL, HDL, LDLCALC, TRIG, CHOLHDL, LDLDIRECT in the last 72 hours. Thyroid Function Tests: No results for input(s): TSH, T4TOTAL, FREET4, T3FREE, THYROIDAB in the last 72 hours. Anemia Panel: No results for input(s): VITAMINB12, FOLATE, FERRITIN, TIBC, IRON, RETICCTPCT in the last 72 hours. Urine analysis:    Component Value Date/Time   COLORURINE YELLOW 05/01/2016 2104   APPEARANCEUR CLEAR 05/01/2016 2104   LABSPEC 1.025 05/01/2016 2104   PHURINE 6.0 05/01/2016 2104   GLUCOSEU NEGATIVE 05/01/2016 2104   GLUCOSEU NEG mg/dL 69/62/9528 4132   HGBUR NEGATIVE 05/01/2016 2104   BILIRUBINUR NEGATIVE 05/01/2016 2104   KETONESUR NEGATIVE 05/01/2016 2104   PROTEINUR 100 (A) 05/01/2016 2104   UROBILINOGEN 0.2 08/08/2014 1445   NITRITE NEGATIVE 05/01/2016 2104   LEUKOCYTESUR NEGATIVE 05/01/2016 2104   Sepsis Labs: @LABRCNTIP (procalcitonin:4,lacticidven:4) )No results found for this or any previous visit (from the past 240 hour(s)).   Radiological Exams on Admission: Dg Chest 2 View  Result Date: 05/17/2016 CLINICAL DATA:  Dyspnea. Leg swelling and weakness. Shortness of breath for 2 days. EXAM: CHEST  2 VIEW COMPARISON:  Two-view chest x-ray 05/08/2016 FINDINGS: The heart is enlarged. Diffuse interstitial edema has increased. Bilateral effusions are present. Bibasilar airspace disease likely reflects atelectasis. The visualized soft tissues and bony thorax are unremarkable. IMPRESSION: Progressive congestive heart failure. Electronically Signed   By: Marin Roberts M.D.   On: 05/17/2016 18:54    EKG: Independently reviewed. Sinus tachycardia (rate 108), PVCs, QTc 503 ms   Assessment/Plan  1. Acute on chronic combined  systolic/diastolic CHF - Pt presents with marked peripheral edema, increased diffuse pulmonary edema, and 10 lb wt gain since recent hospital discharge  - Suspect this is secondary to dietary indiscretion, and possibly non-adherence as in past; no evidence for acute ischemia  - TTE (09/30/15) with EF 20-25%, severe concentric hypertrophy, diffuse HK, moderate LAE, and mild TR  - He was treated with 80 mg IV Lasix in ED and has started to diurese well  - Plan to monitor on telemetry, continue diuresis with Lasix 80 mg IV q12h, use nitrate for marked HTN given reported  intolerance of hydralazine, follow strict I/Os, daily wts, SLIV, fluid-restrict diet, update TTE, check daily chem panel during diuresis - Will continue Coreg and Aldactone as tolerated    2. Acute hypoxic respiratory failure - Pt was saturating only 81% when EMS arrived at his home and is noted to exhibit increased WOB and tachypnea - Likely secondary to acute CHF as above; saturating well with 3 Lpm supplemental O2  - Anticipate resolution with diuresis, will monitor with continuous pulse oximetry and wean supplemental O2 as able   3. Chronic atrial fibrillation  - Was in a sinus rhythm initially, then into atrial fibrillation  - CHADS-VASc at least 2 (CHF, HTN)  - He is anticoagulated with Xarelto, will continue  - Continue Coreg as tolerated    4. Hypertension  - Currently elevated in setting of acute CHF - Continue Coreg as tolerated  - Treated with nitroglycerin ointment on admission given acute CHF and reported intolerance of hydralazine   5. CKD stage II  - SCr is 1.10 on admission, consistent with his baseline  - Follow daily BMP during IV diuresis   6. Normocytic anemia  - Hgb is 10.0 on admission with normal MCV; this is stable relative to priors and there is no bleeding - Attributed to iron-deficiency; continue iron-supplements    DVT prophylaxis: Xarelto  Code Status: Full  Family Communication: Discussed  with patient Disposition Plan: Admit to telemetry Consults called: None Admission status: Inpatient    Briscoe Deutscher, MD [facility] Pager [phone]  If 7PM-7AM, please contact night-coverage [email or web address] Password [facility]  05/17/2016, 8:35 PM

## 2016-05-17 NOTE — ED Triage Notes (Signed)
Pt brought in by [facility] c/o weakness and shortness of breath x 3 days. Pt with hx COPD, Asthma, Bronchitis. RA O2 sats 81% upon EMS arrival to pt's home. EMS gave Albuterol inhaler and placed pt on 4L O2 via [place] and O2 sats increased to 98%. NSR with occasional PVC on monitor for EMS. Denies CP. IV start attempted twice, both unsuccessful. EMS reports lung sounds are shallow but clear.

## 2016-05-17 NOTE — ED Notes (Signed)
Report to Tish, RN who is familiar with this patient.

## 2016-05-17 NOTE — ED Provider Notes (Signed)
[facility]-EMERGENCY DEPT Provider Note   CSN: [id number] Arrival date & time: 05/17/16  1739     History   Chief Complaint Chief Complaint  Patient presents with  . Shortness of Breath    HPI [name] is a 50 y.o. male.  HPI  50 year old male with hx of COPD, CHF, noncompliance, presents with dyspnea. Ongoing for past 3 days. Also associated leg, arms and abdominal swelling. Feels fluid overloaded. Last took torsemide at 3 AM last night. States he has been compliant. No chest pain. Has some lower abdominal pain that he thinks is from swelling. Initially 81% room air for EMS. Given albuterol inhaler and oxygen, patient feels better. History of afib, on xarelto. Wears O2 prn at home.  Past Medical History:  Diagnosis Date  . Allergic rhinitis   . Asthma   . Atrial fibrillation (HCC)   . CHF (congestive heart failure) (HCC)   . Essential hypertension   . Gastroesophageal reflux disease   . Heart attack   . History of cardiac catheterization    Normal coronaries December 2016  . Noncompliance    Major problem leading to declining health and recurrent hospitalization  . Nonischemic cardiomyopathy (HCC)    LVEF 20-25%  . OSA (obstructive sleep apnea)   . Osteoarthritis   . Peptic ulcer disease     Patient Active Problem List   Diagnosis Date Noted  . CAP (community acquired pneumonia) 04/04/2016  . Acute on chronic congestive heart failure (HCC)   . Coronary arteries, normal Dec 2016 01/23/2016  . Chronic anticoagulation-Xarelto 01/23/2016  . Chronic renal insufficiency, stage III (moderate) 01/23/2016  . NSVT (nonsustained ventricular tachycardia) (HCC) 01/23/2016  . CHF (congestive heart failure) (HCC) 01/20/2016  . CHF exacerbation (HCC) 01/20/2016  . Acute systolic CHF (congestive heart failure) (HCC) 12/21/2015  . Acute on chronic combined systolic and diastolic CHF (congestive heart failure) (HCC) 12/21/2015  . Elevated troponin 12/21/2015  . Congestive heart  failure (CHF) (HCC) 12/03/2015  . Systolic and diastolic CHF, acute on chronic (HCC)   . Nonischemic cardiomyopathy (HCC)   . Morbid obesity due to excess calories (HCC)   . Noncompliance with diet and medication regimen 12/02/2015  . Acute on chronic systolic congestive heart failure (HCC)   . Acute diastolic CHF (congestive heart failure) (HCC) 11/18/2015  . Acute renal failure superimposed on stage 3 chronic kidney disease (HCC) 10/28/2015  . Acute on chronic combined systolic and diastolic heart failure (HCC) 10/08/2015  . OSA (obstructive sleep apnea) 09/20/2015  . Pulmonary hypertension 09/19/2015  . Leukopenia 09/19/2015  . Normocytic anemia 09/19/2015  . Chronic atrial fibrillation (HCC)   . Dyspnea on exertion 05/28/2015  . Acute respiratory failure with hypoxia (HCC) 04/01/2015  . Pneumonia 04/01/2015  . Hypokalemia 04/01/2015  . Obesity hypoventilation syndrome (HCC) 08/08/2014  . GERD (gastroesophageal reflux disease) 08/08/2014  . Noncompliance with medication regimen 06/28/2014  . Atrial fibrillation with rapid ventricular response (HCC) 06/28/2014  . Edema of right lower extremity 06/21/2014  . Fecal urgency 06/21/2014  . Asthma 02/11/2009  . Hyperlipidemia 07/12/2008  . OBESITY, MORBID 07/12/2008  . Anxiety state 07/12/2008  . DEPRESSION 07/12/2008  . Essential hypertension 07/12/2008  . ALLERGIC RHINITIS 07/12/2008  . GERD 07/12/2008  . Peptic ulcer 07/12/2008  . OSTEOARTHRITIS 07/12/2008    Past Surgical History:  Procedure Laterality Date  . CARDIAC CATHETERIZATION N/A 06/14/2015   Procedure: Right/Left Heart Cath and Coronary Angiography;  Surgeon: Runell Gess, MD;  Location: [facility]  INVASIVE CV LAB;  Service: Cardiovascular;  Laterality: N/A;       Home Medications    Prior to Admission medications   Medication Sig Start Date End Date Taking? Authorizing Provider  atorvastatin (LIPITOR) 40 MG tablet Take 1 tablet (40 mg total) by mouth daily at 6  PM. 04/07/16  Yes Clydia LlanoMutaz Elmahi, MD  beclomethasone (QVAR) 80 MCG/ACT inhaler Inhale 2 puffs into the lungs 2 (two) times daily as needed (for shortness of breath).   Yes Historical Provider, MD  bumetanide (BUMEX) 2 MG tablet Take 2 mg by mouth daily. 04/27/16  Yes Historical Provider, MD  carvedilol (COREG) 25 MG tablet Take 1 tablet (25 mg total) by mouth 2 (two) times daily with a meal. 04/07/16  Yes Clydia LlanoMutaz Elmahi, MD  docusate sodium (COLACE) 100 MG capsule Take 100 mg by mouth 2 (two) times daily.   Yes Historical Provider, MD  ferrous gluconate (FERGON) 324 MG tablet Take 324 mg by mouth 2 (two) times daily with a meal.   Yes Historical Provider, MD  furosemide (LASIX) 80 MG tablet Take 1 tablet (80 mg total) by mouth 2 (two) times daily. 04/07/16  Yes Clydia LlanoMutaz Elmahi, MD  metoprolol succinate (TOPROL-XL) 25 MG 24 hr tablet Take 75 mg by mouth 2 (two) times daily. 03/18/16  Yes Historical Provider, MD  potassium chloride SA (K-DUR,KLOR-CON) 20 MEQ tablet Take 40 mEq by mouth daily.    Yes Historical Provider, MD  rivaroxaban (XARELTO) 20 MG TABS tablet Take 20 mg by mouth daily with breakfast.   Yes Historical Provider, MD  spironolactone (ALDACTONE) 50 MG tablet Take 1 tablet (50 mg total) by mouth daily. 04/07/16  Yes Clydia LlanoMutaz Elmahi, MD  metolazone (ZAROXOLYN) 2.5 MG tablet Take 1 tablet (2.5 mg total) by mouth daily. Patient not taking: Reported on 04/22/2016 04/07/16   Clydia LlanoMutaz Elmahi, MD    Family History Family History  Problem Relation Age of Onset  . Stroke Father   . Heart attack Father   . Aneurysm Mother     Cerebral aneurysm  . Hypertension Sister   . Colon cancer Neg Hx   . Inflammatory bowel disease Neg Hx   . Liver disease Neg Hx     Social History Social History  Substance Use Topics  . Smoking status: Former Smoker    Packs/day: 0.50    Years: 20.00    Types: Cigarettes    Start date: 04/26/1988    Quit date: 06/23/2007  . Smokeless tobacco: Never Used     Comment: 1 ppd  former smoker  . Alcohol use No     Comment: No etoh since 2009     Allergies   Bee venom; Food; Lipitor [atorvastatin]; Aspirin; Banana; Metolazone; Orange juice [orange oil]; Torsemide; Diltiazem; and Hydralazine   Review of Systems Review of Systems  Constitutional: Negative for fever.  Respiratory: Positive for cough and shortness of breath.   Cardiovascular: Positive for leg swelling. Negative for chest pain.  Gastrointestinal: Positive for abdominal distention.  Neurological: Positive for weakness (generalized).  All other systems reviewed and are negative.    Physical Exam Updated Vital Signs BP (!) 185/113   Pulse 65   Temp 98.7 F (37.1 C) (Oral)   Resp 21   Ht 6' (1.829 m)   Wt 300 lb (136.1 kg)   SpO2 100%   BMI 40.69 kg/m   Physical Exam  Constitutional: He is oriented to person, place, and time. He appears well-developed and well-nourished. No distress.  Morbidly obese Wearing sunglasses  HENT:  Head: Normocephalic and atraumatic.  Right Ear: External ear normal.  Left Ear: External ear normal.  Nose: Nose normal.  Eyes: Right eye exhibits no discharge. Left eye exhibits no discharge.  Neck: Neck supple.  Cardiovascular: Normal rate, regular rhythm and normal heart sounds.   Pulmonary/Chest: Effort normal. No accessory muscle usage. Tachypnea noted. He has rales in the right lower field and the left lower field.  Abdominal: Soft. There is no tenderness.  Musculoskeletal: He exhibits edema (some peripheral edema, limited exam due to obesity).  Neurological: He is alert and oriented to person, place, and time.  Skin: Skin is warm and dry. He is not diaphoretic.  Nursing note and vitals reviewed.    ED Treatments / Results  Labs (all labs ordered are listed, but only abnormal results are displayed) Labs Reviewed  BASIC METABOLIC PANEL - Abnormal; Notable for the following:       Result Value   BUN 22 (*)    Calcium 8.5 (*)    All other  components within normal limits  CBC WITH DIFFERENTIAL/PLATELET - Abnormal; Notable for the following:    RBC 4.02 (*)    Hemoglobin 10.0 (*)    HCT 33.1 (*)    MCH 24.9 (*)    RDW 16.3 (*)    All other components within normal limits  BRAIN NATRIURETIC PEPTIDE - Abnormal; Notable for the following:    B Natriuretic Peptide 455.0 (*)    All other components within normal limits  TROPONIN I    EKG  EKG Interpretation  Date/Time:  Sunday May 17 2016 17:57:21 EST Ventricular Rate:  108 PR Interval:    QRS Duration: 93 QT Interval:  375 QTC Calculation: 503 R Axis:   64 Text Interpretation:  Sinus tachycardia Paired ventricular premature complexes Borderline repolarization abnormality Prolonged QT interval no significant change since May 01 2016 Confirmed by Criss Alvine MD, Reya Aurich [phone]) on 05/17/2016 6:10:30 PM       Radiology Dg Chest 2 View  Result Date: 05/17/2016 CLINICAL DATA:  Dyspnea. Leg swelling and weakness. Shortness of breath for 2 days. EXAM: CHEST  2 VIEW COMPARISON:  Two-view chest x-ray 05/08/2016 FINDINGS: The heart is enlarged. Diffuse interstitial edema has increased. Bilateral effusions are present. Bibasilar airspace disease likely reflects atelectasis. The visualized soft tissues and bony thorax are unremarkable. IMPRESSION: Progressive congestive heart failure. Electronically Signed   By: Marin Roberts M.D.   On: 05/17/2016 18:54    Procedures Procedures (including critical care time)  Medications Ordered in ED Medications  ipratropium-albuterol (DUONEB) 0.5-2.5 (3) MG/3ML nebulizer solution 3 mL (3 mLs Nebulization Given 05/17/16 1833)  furosemide (LASIX) injection 80 mg (80 mg Intravenous Given 05/17/16 1952)     Initial Impression / Assessment and Plan / ED Course  I have reviewed the triage vital signs and the nursing notes.  Pertinent labs & imaging results that were available during my care of the patient were reviewed by me and  considered in my medical decision making (see chart for details).  Clinical Course as of May 17 2014  Wynelle Link May 17, 2016  1806 Patient probably has CHF more than COPD. Will check labs, CXR and monitor. Repeat albuterol neb given some improvement.  [SG]  2010 Workup is consistent with CHF exacerbation with pulmonary edema. Given increased work of breathing and significant pulmonary edema on chest x-ray, admit for IV diuresis. Dr. Antionette Char to admit, tele inpatient.  [SG]    Clinical  Course User Index [SG] Pricilla Loveless, MD    Final Clinical Impressions(s) / ED Diagnoses   Final diagnoses:  Acute on chronic congestive heart failure, unspecified congestive heart failure type [facility])    New Prescriptions New Prescriptions   No medications on file     Pricilla Loveless, MD 05/17/16 2015

## 2016-05-18 DIAGNOSIS — I428 Other cardiomyopathies: Secondary | ICD-10-CM

## 2016-05-18 DIAGNOSIS — Z7901 Long term (current) use of anticoagulants: Secondary | ICD-10-CM

## 2016-05-18 DIAGNOSIS — Z9111 Patient's noncompliance with dietary regimen: Secondary | ICD-10-CM

## 2016-05-18 DIAGNOSIS — Z9114 Patient's other noncompliance with medication regimen: Secondary | ICD-10-CM

## 2016-05-18 LAB — MAGNESIUM: Magnesium: 1.9 mg/dL (ref 1.7–2.4)

## 2016-05-18 LAB — BASIC METABOLIC PANEL
Anion gap: 5 (ref 5–15)
BUN: 20 mg/dL (ref 6–20)
CALCIUM: 8.5 mg/dL — AB (ref 8.9–10.3)
CO2: 26 mmol/L (ref 22–32)
CREATININE: 1.22 mg/dL (ref 0.61–1.24)
Chloride: 109 mmol/L (ref 101–111)
GFR calc Af Amer: >60 mL/min (ref >60)
GLUCOSE: 106 mg/dL — AB (ref 65–99)
Potassium: 3.6 mmol/L (ref 3.5–5.1)
Sodium: 140 mmol/L (ref 135–145)

## 2016-05-18 NOTE — Progress Notes (Signed)
Pt had 6 beat run of v-tach.  Md notified.  No new orders at this time.Marland KitchenMarland Kitchen

## 2016-05-18 NOTE — Progress Notes (Addendum)
PROGRESS NOTE  [name] FMB:[id number] DOB: [birth year] DOA: 05/17/2016 PCP: Samuel Grippe, MD  Brief Narrative: 50 year-old male with a past medical history of onischemic cardiomyopathy with combined CHF, chronic atrial fibrillation, hypertension, iron deficiency anemia, and poor compliance, presents with complaints of dyspnea, generalized weakness, and swelling. He was admitted for further evaluation of acute on chronic congestive heart failure with acute hypoxic respiratory failure.   Assessment/Plan: 1. Acute on chronic combined systolic and diastolic CHF. Secondary to diet and medication non-compliance. LVEF 20-25%. UOP 3L, -2.8 L since admission. Improving. 2. Acute on chronic hypoxic respiratory failure. Improved with diuresis. 3. Atrial fibrillation. CHADS2VASC score of 2. Rate-controlled. Continue Xarelto, beta blocker.  4. Normocytic anemia. Stable, secondary to chronic disease. 5. Hypertension.   6. OSA. CPAP QHS.  7. Morbid obesity.   Improving with diuresis. Difficult to keep patient well given non-compliance with diet and meds, counseled repeatedly inpast.  Continue IV Lasix. Check BMP in AM.  Still has sig volume overload, likely will need several days.  DVT prophylaxis: Xarelto  Code Status: Full  Family Communication: No family bedside Disposition Plan: Discharge home once improved.   Brendia Sacks, MD  [facility] Direct contact: [phone] --Via amion app OR  --[email or web address]; password [email or web address]  7PM-7AM contact night coverage as above 05/18/2016, 10:32 AM  LOS: 1 day   Consultants:  None   Procedures:  None   Antimicrobials:  None   CC: Follow-up congestive heart failure.   Interval history/Subjective: Doing well. Eating and breathing well. Good urine output. Denies any pain.   ROS: No nausea or vomiting.  Objective: Vitals:   05/17/16 2123 05/17/16 2231 05/18/16 0700 05/18/16 0751  BP: (!) 159/112  (!) 131/105   Pulse: (!) 119  93     Resp: (!) 28  (!) 24   Temp: 98 F (36.7 C)  98.4 F (36.9 C)   TempSrc: Oral  Oral   SpO2:  98% 97% 95%  Weight: (!) 141.1 kg (311 lb)  (!) 140.6 kg (310 lb)   Height: 6\' 1"  (1.854 m)       Intake/Output Summary (Last 24 hours) at 05/18/16 1032 Last data filed at 05/18/16 0700  Gross per 24 hour  Intake              160 ml  Output             3000 ml  Net            -2840 ml     Filed Weights   05/17/16 1750 05/17/16 2123 05/18/16 0700  Weight: 136.1 kg (300 lb) (!) 141.1 kg (311 lb) (!) 140.6 kg (310 lb)    Exam:    Constitutional:  . Appears calm and comfortable Respiratory:  . CTA bilaterally, no w/r/r.  . Respiratory effort normal. N  Cardiovascular:  . Irregular, normal rate, no m/r/g . 3+ bilateral lower extremity edema, right greater  Telemetry atrial fibrillation  I have personally reviewed following labs and imaging studies:  BMP unremarkable, Magnesium normal  Scheduled Meds: . atorvastatin  40 mg Oral q1800  . budesonide (PULMICORT) nebulizer solution  0.25 mg Nebulization BID  . carvedilol  25 mg Oral BID WC  . docusate sodium  100 mg Oral BID  . ferrous gluconate  324 mg Oral BID WC  . furosemide  80 mg Intravenous Q12H  . potassium chloride SA  40 mEq Oral Daily  . rivaroxaban  20 mg Oral Q breakfast  .  sodium chloride flush  3 mL Intravenous Q12H  . spironolactone  50 mg Oral Daily   Continuous Infusions:  Principal Problem:   Acute on chronic combined systolic and diastolic heart failure (HCC) Active Problems:   Essential hypertension   Atrial fibrillation (HCC)   Acute respiratory failure with hypoxia (HCC)   Chronic atrial fibrillation (HCC)   Normocytic anemia   Noncompliance with diet and medication regimen   Nonischemic cardiomyopathy (HCC)   Morbid obesity due to excess calories (HCC)   Chronic anticoagulation-Xarelto   CKD (chronic kidney disease), stage II   LOS: 1 day   Time spent 15 minutes      By signing my  name below, I, [name], attest that this documentation has been prepared under the direction and in the presence of Brendia Sacksaniel Goodrich, MD. Electronically signed: [name], Scribe. 05/18/16 9:33 AM   I personally performed the services described in this documentation. All medical record entries made by the scribe were at my direction. I have reviewed the chart and agree that the record reflects my personal performance and is accurate and complete. Brendia Sacksaniel Goodrich, MD

## 2016-05-19 LAB — BASIC METABOLIC PANEL
Anion gap: 5 (ref 5–15)
BUN: 24 mg/dL — AB (ref 6–20)
CHLORIDE: 107 mmol/L (ref 101–111)
CO2: 28 mmol/L (ref 22–32)
CREATININE: 1.23 mg/dL (ref 0.61–1.24)
Calcium: 8.4 mg/dL — ABNORMAL LOW (ref 8.9–10.3)
Glucose, Bld: 99 mg/dL (ref 65–99)
POTASSIUM: 3.5 mmol/L (ref 3.5–5.1)
SODIUM: 140 mmol/L (ref 135–145)

## 2016-05-19 NOTE — Progress Notes (Signed)
PROGRESS NOTE  Samuel KlippelMark T Maron ZOX:[id number] DOB: 06/19/1966 DOA: 05/17/2016 PCP: Pearson GrippeJames Kim, MD  Brief Narrative: 50 year-old male with a past medical history of onischemic cardiomyopathy with combined CHF, chronic atrial fibrillation, hypertension, iron deficiency anemia, and poor compliance, presents with complaints of dyspnea, generalized weakness, and swelling. He was admitted for further evaluation of acute on chronic congestive heart failure with acute hypoxic respiratory failure.   Assessment/Plan: 1. Acute on chronic combined systolic and diastolic CHF. Secondary to diet and medication non-compliance. LVEF 20-25%. UOP 4L, -6.36 L since admission.  2. Acute on chronic hypoxic respiratory failure. Resolved with diuresis. 3. Atrial fibrillation. CHADS2VASC score of 2. Rate-controlled. Continue Xarelto, beta blocker.  4. Normocytic anemia. Stable, secondary to chronic disease. 5. Hypertension. Stable.  6. OSA. CPAP QHS.  7. Morbid obesity.   Overall improving with excellent diuresis. Volume overload persists.   Continue diuresis with Lasix. Check BMP in the morning.   DVT prophylaxis: Xarelto  Code Status:Full  Family Communication: No family bedside Disposition Plan: Discharge home once improved.   Brendia Sacksaniel Samamtha Tiegs, MD  [facility] Direct contact: [phone] --Via amion app OR  --[email or web address]; password [email or web address]  7PM-7AM contact night coverage as above 05/19/2016, 7:53 AM  LOS: 2 days   Consultants:  None   Procedures:  None   Antimicrobials:  None   CC: Follow-up congestive heart failure   Interval history/Subjective: Doing better. Breathing well. Denies any pain. Edema improved.   ROS: No nausea or vomiting.   Objective: Vitals:   05/18/16 1257 05/18/16 1950 05/18/16 2122 05/19/16 0600  BP: 94/60  (!) 106/57 127/81  Pulse: 78  78 78  Resp: 20  20 20   Temp: 97.7 F (36.5 C)  98.2 F (36.8 C) 98.4 F (36.9 C)  TempSrc: Oral  Oral Oral  SpO2:  98% 96% 100% 99%  Weight:    135.3 kg (298 lb 3.2 oz)  Height:    6' (1.829 m)    Intake/Output Summary (Last 24 hours) at 05/19/16 0753 Last data filed at 05/19/16 0218  Gross per 24 hour  Intake              480 ml  Output             4000 ml  Net            -3520 ml     Filed Weights   05/17/16 2123 05/18/16 0700 05/19/16 0600  Weight: (!) 141.1 kg (311 lb) (!) 140.6 kg (310 lb) 135.3 kg (298 lb 3.2 oz)    Exam:    Constitutional:  . Appears calm and comfortable Respiratory:  . CTA bilaterally, no w/r/r.  . Respiratory effort normal.  Cardiovascular:  . Irregular rate, normal rhythm, no m/r/g . 3+ bilateral LE extremity edema   . Telemetry atrial fibrillation   I have personally reviewed following labs and imaging studies:  Basic metabolic panel unremarkable.  Scheduled Meds: . atorvastatin  40 mg Oral q1800  . budesonide (PULMICORT) nebulizer solution  0.25 mg Nebulization BID  . carvedilol  25 mg Oral BID WC  . docusate sodium  100 mg Oral BID  . ferrous gluconate  324 mg Oral BID WC  . furosemide  80 mg Intravenous Q12H  . potassium chloride SA  40 mEq Oral Daily  . rivaroxaban  20 mg Oral Q breakfast  . sodium chloride flush  3 mL Intravenous Q12H  . spironolactone  50 mg Oral Daily   Continuous Infusions:  Principal Problem:   Acute on chronic combined systolic and diastolic heart failure (HCC) Active Problems:   Essential hypertension   Atrial fibrillation (HCC)   Acute respiratory failure with hypoxia (HCC)   Chronic atrial fibrillation (HCC)   Normocytic anemia   Noncompliance with diet and medication regimen   Nonischemic cardiomyopathy (HCC)   Morbid obesity due to excess calories (HCC)   Chronic anticoagulation-Xarelto   CKD (chronic kidney disease), stage II   LOS: 2 days   Time spent 15 minutes       By signing my name below, I, Cynda Acres, attest that this documentation has been prepared under the direction and in the  presence of Brendia Sacks, MD. Electronically signed: Cynda Acres, Scribe. 05/19/1709:16 AM   I personally performed the services described in this documentation. All medical record entries made by the scribe were at my direction. I have reviewed the chart and agree that the record reflects my personal performance and is accurate and complete. Brendia Sacks, MD

## 2016-05-20 LAB — BASIC METABOLIC PANEL
Anion gap: 8 (ref 5–15)
BUN: 24 mg/dL — ABNORMAL HIGH (ref 6–20)
CHLORIDE: 105 mmol/L (ref 101–111)
CO2: 26 mmol/L (ref 22–32)
CREATININE: 1.21 mg/dL (ref 0.61–1.24)
Calcium: 8.4 mg/dL — ABNORMAL LOW (ref 8.9–10.3)
GFR calc non Af Amer: >60 mL/min (ref >60)
Glucose, Bld: 85 mg/dL (ref 65–99)
POTASSIUM: 3.3 mmol/L — AB (ref 3.5–5.1)
SODIUM: 139 mmol/L (ref 135–145)

## 2016-05-20 MED ORDER — LISINOPRIL 5 MG PO TABS
5.0000 mg | ORAL_TABLET | Freq: Every day | ORAL | Status: DC
  Administered 2016-05-20 – 2016-05-22 (×3): 5 mg via ORAL
  Filled 2016-05-20 (×3): qty 1

## 2016-05-20 MED ORDER — POTASSIUM CHLORIDE CRYS ER 20 MEQ PO TBCR
40.0000 meq | EXTENDED_RELEASE_TABLET | Freq: Once | ORAL | Status: AC
  Administered 2016-05-20: 40 meq via ORAL
  Filled 2016-05-20: qty 2

## 2016-05-20 NOTE — Progress Notes (Signed)
PROGRESS NOTE    [name]  YSA:[id number] DOB: 1966-01-26 DOA: 05/17/2016 PCP: Pearson Grippe, MD    Brief Narrative:  11 yom with a history of nonischemic cardiomyopathy with combined CHF, chronic atrial fibrillation, hypertension, iron deficiency anemia, and poor compliance, presents with complaints of dyspnea, generalized weakness, and swelling. While in the ED, he was noted to be hypoxic, tachycardic, tachypenic, and hypertensive. He was started on supplemental oxygen, nebs, and lasix and admitted for further evaluation of acute respiratory failure with hypoxia.   Assessment & Plan:   Principal Problem:   Acute on chronic combined systolic and diastolic heart failure (HCC) Active Problems:   Essential hypertension   Atrial fibrillation (HCC)   Acute respiratory failure with hypoxia (HCC)   Chronic atrial fibrillation (HCC)   Normocytic anemia   Noncompliance with diet and medication regimen   Nonischemic cardiomyopathy (HCC)   Morbid obesity due to excess calories (HCC)   Chronic anticoagulation-Xarelto   CKD (chronic kidney disease), stage II  1. Acute on chronic combined systolic and diastolic CHF. Secondary to diet and medication non-compliance. LVEF 20-25%. Patient is having good urine output with diuresis. Net fluid balance -9.3 L since admission. Will continue on lasix. Continue to monitor intake and output. Continue daily weights. Start on lisinopril. Continue coreg 2. Acute on chronic hypoxic respiratory failure. due to CHF. Continue on supplemental oxygen, wean as tolerated.  3. CKD stage II. Creatinine appears to be at baseline.  4. Atrial fibrillation. CHADS2VASC score of 2. Rate-controlled. Continue Xarelto and beta blocker.  5. Normocytic anemia. Stable, secondary to chronic disease. There are no  Signs of bleeding. Continue to monitor.  6. Hypertension. Pressures are now stable. Continue coreg.  7. OSA. Continue on CPAP QHS.  8. Morbid obesity.  DVT prophylaxis:  Xarelto  Code Status: FULL  Family Communication:  family bedside Disposition Plan: Discharge home once improved.    Consultants:   None   Procedures:   None   Antimicrobials:   None    Subjective: Shortness of breath improving. No chest pain. Edema improving.  Objective: Vitals:   05/19/16 2033 05/19/16 2205 05/20/16 0534 05/20/16 0751  BP:  110/73 132/80   Pulse:  (!) 102 72   Resp:  20 16   Temp:  98.1 F (36.7 C) 98 F (36.7 C)   TempSrc:  Oral Oral   SpO2: 98% 100% 98% 97%  Weight:   131.5 kg (290 lb)   Height:        Intake/Output Summary (Last 24 hours) at 05/20/16 0824 Last data filed at 05/20/16 0629  Gross per 24 hour  Intake              360 ml  Output             2700 ml  Net            -2340 ml   Filed Weights   05/18/16 0700 05/19/16 0600 05/20/16 0534  Weight: (!) 140.6 kg (310 lb) 135.3 kg (298 lb 3.2 oz) 131.5 kg (290 lb)    Examination:  General exam: Appears calm and comfortable  Respiratory system: Clear to auscultation. Respiratory effort normal. Cardiovascular system: S1 & S2 heard, RRR. No JVD, murmurs, rubs, gallops or clicks. 1-2+ pedal edema. Gastrointestinal system: Abdomen is nondistended, soft and nontender. No organomegaly or masses felt. Normal bowel sounds heard. Central nervous system: Alert and oriented. No focal neurological deficits. Extremities: Symmetric 5 x 5 power. Skin: No rashes,  lesions or ulcers Psychiatry: Judgement and insight appear normal. Mood & affect appropriate.     Data Reviewed: I have personally reviewed following labs and imaging studies  CBC:  Recent Labs Lab 05/17/16 1814  WBC 4.8  NEUTROABS 3.3  HGB 10.0*  HCT 33.1*  MCV 82.3  PLT 267   Basic Metabolic Panel:  Recent Labs Lab 05/17/16 1814 05/18/16 0617 05/19/16 0540 05/20/16 0519  NA 139 140 140 139  K 3.5 3.6 3.5 3.3*  CL 109 109 107 105  CO2 23 26 28 26   GLUCOSE 97 106* 99 85  BUN 22* 20 24* 24*  CREATININE 1.10 1.22  1.23 1.21  CALCIUM 8.5* 8.5* 8.4* 8.4*  MG  --  1.9  --   --    GFR: Estimated Creatinine Clearance: 104.8 mL/min (by C-G formula based on SCr of 1.21 mg/dL). Liver Function Tests: No results for input(s): AST, ALT, ALKPHOS, BILITOT, PROT, ALBUMIN in the last 168 hours. No results for input(s): LIPASE, AMYLASE in the last 168 hours. No results for input(s): AMMONIA in the last 168 hours. Coagulation Profile: No results for input(s): INR, PROTIME in the last 168 hours. Cardiac Enzymes:  Recent Labs Lab 05/17/16 1814  TROPONINI <0.03   BNP (last 3 results) No results for input(s): PROBNP in the last 8760 hours. HbA1C: No results for input(s): HGBA1C in the last 72 hours. CBG: No results for input(s): GLUCAP in the last 168 hours. Lipid Profile: No results for input(s): CHOL, HDL, LDLCALC, TRIG, CHOLHDL, LDLDIRECT in the last 72 hours. Thyroid Function Tests: No results for input(s): TSH, T4TOTAL, FREET4, T3FREE, THYROIDAB in the last 72 hours. Anemia Panel: No results for input(s): VITAMINB12, FOLATE, FERRITIN, TIBC, IRON, RETICCTPCT in the last 72 hours. Sepsis Labs: No results for input(s): PROCALCITON, LATICACIDVEN in the last 168 hours.  No results found for this or any previous visit (from the past 240 hour(s)).       Radiology Studies: No results found.      Scheduled Meds: . atorvastatin  40 mg Oral q1800  . budesonide (PULMICORT) nebulizer solution  0.25 mg Nebulization BID  . carvedilol  25 mg Oral BID WC  . docusate sodium  100 mg Oral BID  . ferrous gluconate  324 mg Oral BID WC  . furosemide  80 mg Intravenous Q12H  . potassium chloride SA  40 mEq Oral Daily  . rivaroxaban  20 mg Oral Q breakfast  . sodium chloride flush  3 mL Intravenous Q12H  . spironolactone  50 mg Oral Daily   Continuous Infusions:   LOS: 3 days    Time spent: 25 minutes     Erick BlinksJehanzeb Memon, MD [facility] If 7PM-7AM, please contact  night-coverage [email or web address] Password [email or web address][email or web address] 05/20/2016, 8:24 AM

## 2016-05-21 LAB — BASIC METABOLIC PANEL
ANION GAP: 9 (ref 5–15)
BUN: 26 mg/dL — AB (ref 6–20)
CALCIUM: 8.6 mg/dL — AB (ref 8.9–10.3)
CO2: 26 mmol/L (ref 22–32)
CREATININE: 1.25 mg/dL — AB (ref 0.61–1.24)
Chloride: 104 mmol/L (ref 101–111)
GFR calc Af Amer: >60 mL/min (ref >60)
GLUCOSE: 86 mg/dL (ref 65–99)
Potassium: 3.8 mmol/L (ref 3.5–5.1)
Sodium: 139 mmol/L (ref 135–145)

## 2016-05-21 NOTE — Progress Notes (Signed)
PROGRESS NOTE    Samuel KlippelMark T Fitzpatrick  ZOX:[id number] DOB: 06/16/1966 DOA: 05/17/2016 PCP: Pearson GrippeJames Kim, MD    Brief Narrative: [age] yom with a history of nonischemic cardiomyopathy with combined CHF, chronic atrial fibrillation, hypertension, iron deficiency anemia, and poor compliance, presents with complaints of dyspnea, generalized weakness, and swelling. While in the ED, he was noted to be hypoxic, tachycardic, tachypenic, and hypertensive. He was started on supplemental oxygen, nebs, and lasix and admitted for further evaluation of acute respiratory failure with hypoxia.    Assessment & Plan:   Principal Problem:   Acute on chronic combined systolic and diastolic heart failure (HCC) Active Problems:   Essential hypertension   Atrial fibrillation (HCC)   Acute respiratory failure with hypoxia (HCC)   Chronic atrial fibrillation (HCC)   Normocytic anemia   Noncompliance with diet and medication regimen   Nonischemic cardiomyopathy (HCC)   Morbid obesity due to excess calories (HCC)   Chronic anticoagulation-Xarelto   CKD (chronic kidney disease), stage II  1. Acute on chronic combined systolic and diastolic CHF. Secondary to diet and medication non-compliance. LVEF 20-25%. Patient is having good urine output with diuresis. Net fluid balance -12.1L since admission. Will continue on lasix. Continue to monitor intake and output. Continue daily weights. Started on lisinopril. Continue coreg 2. Acute on chronic hypoxic respiratory failuredue to CHF. Continue on supplemental oxygen, wean as tolerated.  3. CKD stage II. Creatinine appears to be at baseline.  4. Atrial fibrillation. CHADS2VASC score of 2. Rate-controlled. Continue Xarelto and beta blocker.  5. Normocytic anemia. Stable, secondary to chronic disease. There are no signs of bleeding. Continue to monitor.  6. Hypertension. Pressures are now stable. Continue coreg.  7. OSA. Continue on CPAP QHS.  8. Morbid obesity. Noted.   DVT  prophylaxis: Xarelto Code Status: Full Family Communication: discussed with patient  Disposition Plan: Discharge home once improved   Consultants:   None  Procedures:   None  Antimicrobials:   None   Subjective: Shortness of breath improving. No chest pain  Objective: Vitals:   05/20/16 1721 05/20/16 1948 05/20/16 2150 05/21/16 0600  BP: 107/75  130/77 (!) 133/99  Pulse: 82 65 90 60  Resp: 14 16 16    Temp: 97.8 F (36.6 C)  98.1 F (36.7 C) 98.4 F (36.9 C)  TempSrc: Oral  Oral Oral  SpO2: 100% 96% 99% 97%  Weight:    128.5 kg (283 lb 4.8 oz)  Height:        Intake/Output Summary (Last 24 hours) at 05/21/16 0759 Last data filed at 05/21/16 0700  Gross per 24 hour  Intake              960 ml  Output             3400 ml  Net            -2440 ml   Filed Weights   05/19/16 0600 05/20/16 0534 05/21/16 0600  Weight: 135.3 kg (298 lb 3.2 oz) 131.5 kg (290 lb) 128.5 kg (283 lb 4.8 oz)    Examination:  General exam: Appears calm and comfortable  Respiratory system: Clear to auscultation. Respiratory effort normal. Cardiovascular system: S1 & S2 heard, RRR. No JVD, murmurs, rubs, gallops or clicks. 1+ pedal edema. Gastrointestinal system: Abdomen is nondistended, soft and nontender. No organomegaly or masses felt. Normal bowel sounds heard. Central nervous system: Alert and oriented. No focal neurological deficits. Extremities: Symmetric 5 x 5 power. Skin: No rashes, lesions or  ulcers Psychiatry: Judgement and insight appear normal. Mood & affect appropriate.     Data Reviewed: I have personally reviewed following labs and imaging studies  CBC:  Recent Labs Lab 05/17/16 1814  WBC 4.8  NEUTROABS 3.3  HGB 10.0*  HCT 33.1*  MCV 82.3  PLT 267   Basic Metabolic Panel:  Recent Labs Lab 05/17/16 1814 05/18/16 0617 05/19/16 0540 05/20/16 0519 05/21/16 0537  NA 139 140 140 139 139  K 3.5 3.6 3.5 3.3* 3.8  CL 109 109 107 105 104  CO2 23 26 28 26 26    GLUCOSE 97 106* 99 85 86  BUN 22* 20 24* 24* 26*  CREATININE 1.10 1.22 1.23 1.21 1.25*  CALCIUM 8.5* 8.5* 8.4* 8.4* 8.6*  MG  --  1.9  --   --   --    GFR: Estimated Creatinine Clearance: 100.2 mL/min (by C-G formula based on SCr of 1.25 mg/dL (H)). Liver Function Tests: No results for input(s): AST, ALT, ALKPHOS, BILITOT, PROT, ALBUMIN in the last 168 hours. No results for input(s): LIPASE, AMYLASE in the last 168 hours. No results for input(s): AMMONIA in the last 168 hours. Coagulation Profile: No results for input(s): INR, PROTIME in the last 168 hours. Cardiac Enzymes:  Recent Labs Lab 05/17/16 1814  TROPONINI <0.03   BNP (last 3 results) No results for input(s): PROBNP in the last 8760 hours. HbA1C: No results for input(s): HGBA1C in the last 72 hours. CBG: No results for input(s): GLUCAP in the last 168 hours. Lipid Profile: No results for input(s): CHOL, HDL, LDLCALC, TRIG, CHOLHDL, LDLDIRECT in the last 72 hours. Thyroid Function Tests: No results for input(s): TSH, T4TOTAL, FREET4, T3FREE, THYROIDAB in the last 72 hours. Anemia Panel: No results for input(s): VITAMINB12, FOLATE, FERRITIN, TIBC, IRON, RETICCTPCT in the last 72 hours. Sepsis Labs: No results for input(s): PROCALCITON, LATICACIDVEN in the last 168 hours.  No results found for this or any previous visit (from the past 240 hour(s)).       Radiology Studies: No results found.      Scheduled Meds: . atorvastatin  40 mg Oral q1800  . budesonide (PULMICORT) nebulizer solution  0.25 mg Nebulization BID  . carvedilol  25 mg Oral BID WC  . docusate sodium  100 mg Oral BID  . ferrous gluconate  324 mg Oral BID WC  . furosemide  80 mg Intravenous Q12H  . lisinopril  5 mg Oral Daily  . potassium chloride SA  40 mEq Oral Daily  . rivaroxaban  20 mg Oral Q breakfast  . sodium chloride flush  3 mL Intravenous Q12H  . spironolactone  50 mg Oral Daily   Continuous Infusions:   LOS: 4 days     Time spent: 25 minutes    Erick Blinks, MD [facility]   If 7PM-7AM, please contact night-coverage [email or web address] Password [email or web address] 05/21/2016, 7:59 AM

## 2016-05-21 NOTE — Care Management Note (Signed)
Case Management Note  Patient Details  Name: [name] MRN: [id number] Date of Birth: 11/07/1965  Subjective/Objective:                  Pt admitted with CHF. He is from home, lives with family and is ind with ADL's. He has PCP and is non-compliant with medications, largely due to lack of understanding of disease process. Pt has been enrolled in multiple programs attempting to educate and provide support to pt in the outpt setting however due to his chronic non-compliance pt has been discharged from all programs at this time.   Action/Plan: Pt plans to return home with self care at DC.  Will cont to follow for DC planning.    Expected Discharge Date:    05/23/2016               Expected Discharge Plan:  Home/Self Care  In-House Referral:  NA  Discharge planning Services  CM Consult  Post Acute Care Choice:  NA Choice offered to:  NA  Status of Service:  In process, will continue to follow  [name], RN 05/21/2016, 3:43 PM

## 2016-05-22 LAB — BASIC METABOLIC PANEL
Anion gap: 6 (ref 5–15)
BUN: 30 mg/dL — AB (ref 6–20)
CHLORIDE: 105 mmol/L (ref 101–111)
CO2: 28 mmol/L (ref 22–32)
Calcium: 8.6 mg/dL — ABNORMAL LOW (ref 8.9–10.3)
Creatinine, Ser: 1.35 mg/dL — ABNORMAL HIGH (ref 0.61–1.24)
GFR calc Af Amer: >60 mL/min (ref >60)
GFR calc non Af Amer: >60 mL/min (ref >60)
GLUCOSE: 92 mg/dL (ref 65–99)
POTASSIUM: 3.6 mmol/L (ref 3.5–5.1)
Sodium: 139 mmol/L (ref 135–145)

## 2016-05-22 MED ORDER — DOCUSATE SODIUM 100 MG PO CAPS: 100.0000 mg | ORAL_CAPSULE | Freq: Two times a day (BID) | ORAL | 0 refills | Status: DC

## 2016-05-22 MED ORDER — BUMETANIDE 2 MG PO TABS: 2.0000 mg | ORAL_TABLET | Freq: Two times a day (BID) | ORAL | 1 refills | Status: DC

## 2016-05-22 MED ORDER — SPIRONOLACTONE 50 MG PO TABS: 50.0000 mg | ORAL_TABLET | Freq: Every day | ORAL | 0 refills | Status: DC

## 2016-05-22 MED ORDER — LISINOPRIL 5 MG PO TABS: 5.0000 mg | ORAL_TABLET | Freq: Every day | ORAL | 0 refills | Status: DC

## 2016-05-22 NOTE — Plan of Care (Signed)
Problem: Food- and Nutrition-Related Knowledge Deficit (NB-1.1) Goal: Nutrition education Formal process to instruct or train a patient/client in a skill or to impart knowledge to help patients/clients voluntarily manage or modify food choices and eating behavior to maintain or improve health. Outcome: Adequate for Discharge Nutrition Education Note  RD consulted for nutrition education regarding  CHF.  RD provided "Eating Plan for Heart Failure" which is a comprehensive nutritionally focused handout.  Provided examples on ways to decrease sodium intake in diet. Discouraged intake of processed foods and use of salt shaker. Encouraged fresh fruits and vegetables as well as whole grain sources of carbohydrates to maximize fiber intake.   RD discussed why it is important for patient to adhere to diet recommendations, and emphasized the role of fluids, foods to avoid, and importance of weighing self daily. Teach back method used.   Filed Weights   05/20/16 0534 05/21/16 0600 05/22/16 0533  Weight: 290 lb (131.5 kg) 283 lb 4.8 oz (128.5 kg) 278 lb 8 oz (126.3 kg)     Expect poor compliance. Patient only focuses on his limitations such as "I have to eat what I have, I don't any money for food". RD attempted to re-direct and offered suggestions such as accessing local church food banks and getting help with budgeting the money he does receive.  Body mass index is 37.77 kg/m. Pt meets criteria for obesity class II based on current BMI. Patient would benefit from reducing his total weight by at least  5-10% but based on interaction with him today he is not ready to make a commitment to change his lifestyle to facilitate this process.  Current diet order is Heart Healthy/CHO Modified, patient is consuming approximately 75-100% of meals at this time.   Labs and medications reviewed.  Recent Labs Lab 05/18/16 0617  05/20/16 0519 05/21/16 0537 05/22/16 0550  NA 140  < > 139 139 139  K 3.6  < >  3.3* 3.8 3.6  CL 109  < > 105 104 105  CO2 26  < > 26 26 28   BUN 20  < > 24* 26* 30*  CREATININE 1.22  < > 1.21 1.25* 1.35*  CALCIUM 8.5*  < > 8.4* 8.6* 8.6*  MG 1.9  --   --   --   --   GLUCOSE 106*  < > 85 86 92  < > = values in this interval not displayed.    No further nutrition interventions warranted at this time.   Royann Shivers MS,RD,CSG,LDN Office: [phone] Pager: [phone]

## 2016-05-22 NOTE — Discharge Summary (Signed)
Physician Discharge Summary  [name] ZOX:[id number] DOB: 02-27-1966 DOA: 05/17/2016  PCP: Pearson Grippe, MD  Admit date: 05/17/2016 Discharge date: 05/22/2016  Admitted From: home Disposition:  home  Recommendations for Outpatient Follow-up:  1. Follow up with PCP in 1-2 weeks 2. Please obtain BMP/CBC in one week 3. Please follow up on the following pending results:  Home Health: Equipment/Devices:  Discharge ConditionStable CODE STATUS:full code Diet recommendation: Heart Healthy   Brief/Interim Summary: 4 yom with a history of nonischemic cardiomyopathy with combined CHF, chronic atrial fibrillation, hypertension, iron deficiency anemia, and poor compliance, presents with complaints of dyspnea, generalized weakness, and swelling. While in the ED, he was noted to be hypoxic, tachycardic, tachypenic, and hypertensive. He was started on supplemental oxygen, nebs, and lasix and admitted for further evaluation of acute respiratory failure with hypoxia.  Discharge Diagnoses:  Principal Problem:   Acute on chronic combined systolic and diastolic heart failure (HCC) Active Problems:   Essential hypertension   Atrial fibrillation (HCC)   Acute respiratory failure with hypoxia (HCC)   Chronic atrial fibrillation (HCC)   Normocytic anemia   Noncompliance with diet and medication regimen   Nonischemic cardiomyopathy (HCC)   Morbid obesity due to excess calories (HCC)   Chronic anticoagulation-Xarelto   CKD (chronic kidney disease), stage II  1. Acute on chronic combined systolic and diastolic CHF. Secondary to diet and medication non-compliance. LVEF 20-25%. Patient was treated with intravenous Lasix and had good urine output. Net fluid balance-13.6L on discharge. Patient has been insistent that taking his diuretics at home has caused him to swell. He is repeatedly saying that taking Lasix worsens his edema. He reports "compliance with medication, although this is somewhat doubtful.  When questioned about his fluid and dietary restrictions, patient initially reported that he has been compliant. On further questioning, he admits to often drinking soda, Kool-Aid, excessive amounts of water, he has canned vegetables, hotdogs, potato chips and many other items that he should not be eating. He was seen by nutrition but was resistant to follow any advice. He was offered a referral to the heart failure clinic to help further manage his congestive heart failure, but feels that he will not be able to travel to make this appointment and refused referral. Review records indicate that he has dismissed in dismissed from cardiology practice in [place] due to noncompliance and frequent no shows. His last referral was made to [facility] cardiology clinic in the beginning of 04/2016. He also missed this appointment. At this point, the patient appears to be approaching baseline. With his long history of noncompliance, I don't anticipate that he will be out of the hospital too long. He is refusing home health services. Lasix will be changed to Bumex. He's been advised to monitor his intake and output. He is also on Coreg and lisinopril. He is also on Aldactone.  2. Acute on chronic hypoxic respiratory failuredue to CHF. Improved with diuresis. Currently breathing comfortably on room air  3. CKD stage II. Creatinine appears to be at baseline.  4. Atrial fibrillation. CHADS2VASC score of 2. Rate-controlled. Continue Xarelto and beta blocker.  5. Normocytic anemia. Stable, secondary to chronic disease. There are no signs of bleeding. Continue to monitor.  6. Hypertension. Pressures are now stable. Continue coreg.  7. OSA. Continue on CPAP QHS.  8. Morbid obesity. Noted.  Discharge Instructions  Discharge Instructions    Diet - low sodium heart healthy    Complete by:  As directed  Increase activity slowly    Complete by:  As directed        Medication List    STOP taking  these medications   furosemide 80 MG tablet Commonly known as:  LASIX     TAKE these medications   atorvastatin 40 MG tablet Commonly known as:  LIPITOR Take 1 tablet (40 mg total) by mouth daily at 6 PM.   beclomethasone 80 MCG/ACT inhaler Commonly known as:  QVAR Inhale 2 puffs into the lungs 2 (two) times daily as needed (for shortness of breath).   bumetanide 2 MG tablet Commonly known as:  BUMEX Take 1 tablet (2 mg total) by mouth 2 (two) times daily. What changed:  when to take this   carvedilol 25 MG tablet Commonly known as:  COREG Take 1 tablet (25 mg total) by mouth 2 (two) times daily with a meal.   docusate sodium 100 MG capsule Commonly known as:  COLACE Take 100 mg by mouth 2 (two) times daily. What changed:  Another medication with the same name was added. Make sure you understand how and when to take each.   docusate sodium 100 MG capsule Commonly known as:  COLACE Take 1 capsule (100 mg total) by mouth 2 (two) times daily. What changed:  You were already taking a medication with the same name, and this prescription was added. Make sure you understand how and when to take each.   ferrous gluconate 324 MG tablet Commonly known as:  FERGON Take 324 mg by mouth 2 (two) times daily with a meal.   lisinopril 5 MG tablet Commonly known as:  PRINIVIL,ZESTRIL Take 1 tablet (5 mg total) by mouth daily.   potassium chloride SA 20 MEQ tablet Commonly known as:  K-DUR,KLOR-CON Take 40 mEq by mouth daily.   rivaroxaban 20 MG Tabs tablet Commonly known as:  XARELTO Take 20 mg by mouth daily with breakfast.   spironolactone 50 MG tablet Commonly known as:  ALDACTONE Take 1 tablet (50 mg total) by mouth daily.       Allergies  Allergen Reactions  . Bee Venom Shortness Of Breath, Swelling and Other (See Comments)    Reaction:  Facial swelling  . Food Shortness Of Breath, Rash and Other (See Comments)    Pt states that he is allergic to strawberries.    .  Lipitor [Atorvastatin] Shortness Of Breath and Other (See Comments)    Reaction:  Nose bleeds   . Aspirin Other (See Comments)    Reaction:  GI upset   . Banana Diarrhea  . Metolazone Other (See Comments)    Patient states that he stopped taking due to "heart attack" like symptoms  . Orange Juice [Orange Oil] Nausea And Vomiting  . Torsemide Swelling and Other (See Comments)    Reaction:  Swelling of feet/legs   . Diltiazem Palpitations  . Hydralazine Palpitations    Consultations:     Procedures/Studies: Dg Chest 2 View  Result Date: 05/17/2016 CLINICAL DATA:  Dyspnea. Leg swelling and weakness. Shortness of breath for 2 days. EXAM: CHEST  2 VIEW COMPARISON:  Two-view chest x-ray 05/08/2016 FINDINGS: The heart is enlarged. Diffuse interstitial edema has increased. Bilateral effusions are present. Bibasilar airspace disease likely reflects atelectasis. The visualized soft tissues and bony thorax are unremarkable. IMPRESSION: Progressive congestive heart failure. Electronically Signed   By: Marin Roberts M.D.   On: 05/17/2016 18:54   Dg Chest 2 View  Result Date: 05/01/2016 CLINICAL DATA:  Patient with  shortness of breath. EXAM: CHEST  2 VIEW COMPARISON:  Chest radiograph 04/26/2016. FINDINGS: Monitoring leads overlie the patient. Marked cardiomegaly. Pulmonary vascular redistribution and mild interstitial pulmonary edema. No definite pleural effusion. Thoracic spine degenerative changes. IMPRESSION: Marked cardiomegaly with pulmonary vascular redistribution and mild interstitial edema. Electronically Signed   By: Annia Beltrew  Davis M.D.   On: 05/01/2016 20:02       Subjective:  Feeling better. No shortness of breath. Able to lie flat without difficulty. Discharge Exam: Vitals:   05/21/16 2056 05/22/16 0533  BP: 110/62 115/85  Pulse: 62 95  Resp: (!) 21 20  Temp: 98.5 F (36.9 C) 97.9 F (36.6 C)   Vitals:   05/21/16 2030 05/21/16 2056 05/22/16 0533 05/22/16 0800  BP:   110/62 115/85   Pulse:  62 95   Resp:  (!) 21 20   Temp:  98.5 F (36.9 C) 97.9 F (36.6 C)   TempSrc:  Oral Oral   SpO2: 94% 97% 100% 100%  Weight:   126.3 kg (278 lb 8 oz)   Height:        General: Pt is alert, awake, not in acute distress Cardiovascular: RRR, S1/S2 +, no rubs, no gallops Respiratory: CTA bilaterally, no wheezing, no rhonchi Abdominal: Soft, NT, ND, bowel sounds + Extremities: 1+ edema, no cyanosis    The results of significant diagnostics from this hospitalization (including imaging, microbiology, ancillary and laboratory) are listed below for reference.     Microbiology: No results found for this or any previous visit (from the past 240 hour(s)).   Labs: BNP (last 3 results)  Recent Labs  04/04/16 1235 04/22/16 1938 05/17/16 1814  BNP 663.0* 775.0* 455.0*   Basic Metabolic Panel:  Recent Labs Lab 05/18/16 0617 05/19/16 0540 05/20/16 0519 05/21/16 0537 05/22/16 0550  NA 140 140 139 139 139  K 3.6 3.5 3.3* 3.8 3.6  CL 109 107 105 104 105  CO2 26 28 26 26 28   GLUCOSE 106* 99 85 86 92  BUN 20 24* 24* 26* 30*  CREATININE 1.22 1.23 1.21 1.25* 1.35*  CALCIUM 8.5* 8.4* 8.4* 8.6* 8.6*  MG 1.9  --   --   --   --    Liver Function Tests: No results for input(s): AST, ALT, ALKPHOS, BILITOT, PROT, ALBUMIN in the last 168 hours. No results for input(s): LIPASE, AMYLASE in the last 168 hours. No results for input(s): AMMONIA in the last 168 hours. CBC:  Recent Labs Lab 05/17/16 1814  WBC 4.8  NEUTROABS 3.3  HGB 10.0*  HCT 33.1*  MCV 82.3  PLT 267   Cardiac Enzymes:  Recent Labs Lab 05/17/16 1814  TROPONINI <0.03   BNP: Invalid input(s): POCBNP CBG: No results for input(s): GLUCAP in the last 168 hours. D-Dimer No results for input(s): DDIMER in the last 72 hours. Hgb A1c No results for input(s): HGBA1C in the last 72 hours. Lipid Profile No results for input(s): CHOL, HDL, LDLCALC, TRIG, CHOLHDL, LDLDIRECT in the last 72  hours. Thyroid function studies No results for input(s): TSH, T4TOTAL, T3FREE, THYROIDAB in the last 72 hours.  Invalid input(s): FREET3 Anemia work up No results for input(s): VITAMINB12, FOLATE, FERRITIN, TIBC, IRON, RETICCTPCT in the last 72 hours. Urinalysis    Component Value Date/Time   COLORURINE YELLOW 05/01/2016 2104   APPEARANCEUR CLEAR 05/01/2016 2104   LABSPEC 1.025 05/01/2016 2104   PHURINE 6.0 05/01/2016 2104   GLUCOSEU NEGATIVE 05/01/2016 2104   GLUCOSEU NEG mg/dL 29/56/213008/10/2009 [id number]  HGBUR NEGATIVE 05/01/2016 2104   BILIRUBINUR NEGATIVE 05/01/2016 2104   KETONESUR NEGATIVE 05/01/2016 2104   PROTEINUR 100 (A) 05/01/2016 2104   UROBILINOGEN 0.2 08/08/2014 1445   NITRITE NEGATIVE 05/01/2016 2104   LEUKOCYTESUR NEGATIVE 05/01/2016 2104   Sepsis Labs Invalid input(s): PROCALCITONIN,  WBC,  LACTICIDVEN Microbiology No results found for this or any previous visit (from the past 240 hour(s)).   Time coordinating discharge: Over 30 minutes  SIGNED:   Erick Blinks, MD  [facility] 05/22/2016, 8:06 PM Pager   If 7PM-7AM, please contact night-coverage [email or web address] Password [email or web address]

## 2016-05-22 NOTE — Care Management Important Message (Signed)
Important Message  Patient Details  Name: [name] MRN: [id number] Date of Birth: 13-Feb-1966   Medicare Important Message Given:  Yes    [name], RN 05/22/2016, 9:14 AM

## 2016-05-23 NOTE — Progress Notes (Signed)
Patient discharged with instructions, prescription, and care notes.  Verbalized understanding via teach back.  IV was removed and the site was WNL. Patient voiced no further complaints or concerns at the time of discharge.  Appointments scheduled per instructions.  Patient left the floor via w/c family  And staff in stable condition. 

## 2016-06-20 ENCOUNTER — Emergency Department (HOSPITAL_COMMUNITY)
Admission: EM | Admit: 2016-06-20 | Discharge: 2016-06-20 | Disposition: A | Discharge status: Home / Self Care | Payer: Medicare Other | Source: Home / Self Care | Attending: Emergency Medicine | Admitting: Emergency Medicine

## 2016-06-20 ENCOUNTER — Emergency Department (HOSPITAL_COMMUNITY): Payer: Medicare Other

## 2016-06-20 ENCOUNTER — Encounter (HOSPITAL_COMMUNITY): Payer: Self-pay | Admitting: Emergency Medicine

## 2016-06-20 DIAGNOSIS — Z87891 Personal history of nicotine dependence: Secondary | ICD-10-CM

## 2016-06-20 DIAGNOSIS — I130 Hypertensive heart and chronic kidney disease with heart failure and stage 1 through stage 4 chronic kidney disease, or unspecified chronic kidney disease: Secondary | ICD-10-CM | POA: Diagnosis not present

## 2016-06-20 DIAGNOSIS — J45909 Unspecified asthma, uncomplicated: Secondary | ICD-10-CM

## 2016-06-20 DIAGNOSIS — Z79899 Other long term (current) drug therapy: Secondary | ICD-10-CM | POA: Insufficient documentation

## 2016-06-20 DIAGNOSIS — R0602 Shortness of breath: Secondary | ICD-10-CM | POA: Diagnosis not present

## 2016-06-20 DIAGNOSIS — N182 Chronic kidney disease, stage 2 (mild): Secondary | ICD-10-CM | POA: Insufficient documentation

## 2016-06-20 DIAGNOSIS — I509 Heart failure, unspecified: Secondary | ICD-10-CM

## 2016-06-20 DIAGNOSIS — I5043 Acute on chronic combined systolic (congestive) and diastolic (congestive) heart failure: Secondary | ICD-10-CM | POA: Insufficient documentation

## 2016-06-20 LAB — CBC
HCT: 32.4 % — ABNORMAL LOW (ref 39.0–52.0)
HEMOGLOBIN: 10 g/dL — AB (ref 13.0–17.0)
MCH: 25.2 pg — AB (ref 26.0–34.0)
MCHC: 30.9 g/dL (ref 30.0–36.0)
MCV: 81.6 fL (ref 78.0–100.0)
Platelets: 262 10*3/uL (ref 150–400)
RBC: 3.97 MIL/uL — ABNORMAL LOW (ref 4.22–5.81)
RDW: 16.7 % — ABNORMAL HIGH (ref 11.5–15.5)
WBC: 4.6 10*3/uL (ref 4.0–10.5)

## 2016-06-20 LAB — BASIC METABOLIC PANEL
ANION GAP: 5 (ref 5–15)
BUN: 17 mg/dL (ref 6–20)
CHLORIDE: 109 mmol/L (ref 101–111)
CO2: 25 mmol/L (ref 22–32)
Calcium: 8.6 mg/dL — ABNORMAL LOW (ref 8.9–10.3)
Creatinine, Ser: 0.95 mg/dL (ref 0.61–1.24)
GFR calc non Af Amer: >60 mL/min (ref >60)
Glucose, Bld: 95 mg/dL (ref 65–99)
POTASSIUM: 3.8 mmol/L (ref 3.5–5.1)
Sodium: 139 mmol/L (ref 135–145)

## 2016-06-20 LAB — BRAIN NATRIURETIC PEPTIDE: B Natriuretic Peptide: 640 pg/mL — ABNORMAL HIGH (ref 0.0–100.0)

## 2016-06-20 LAB — TROPONIN I: Troponin I: 0.03 ng/mL (ref <0.03)

## 2016-06-20 MED ORDER — FUROSEMIDE 10 MG/ML IJ SOLN
80.0000 mg | Freq: Once | INTRAMUSCULAR | Status: AC
  Administered 2016-06-20: 80 mg via INTRAVENOUS
  Filled 2016-06-20: qty 8

## 2016-06-20 NOTE — ED Notes (Signed)
Call from lab-  Critical trop 0.03 Dr Erin Hearing informed

## 2016-06-20 NOTE — ED Provider Notes (Signed)
[facility]-EMERGENCY DEPT Provider Note   CSN: [id number] Arrival date & time: 06/20/16  2056    By signing my name below, I, Valentino Saxon, attest that this documentation has been prepared under the direction and in the presence of Marily Memos, MD. Electronically Signed: Valentino Saxon, ED Scribe. 06/20/16. 9:44 PM.  History   Chief Complaint Chief Complaint  Patient presents with  . Shortness of Breath    irratic compliance with medication   The history is provided by the patient. No language interpreter was used.   HPI Comments: [name] is a 50 y.o. male with PMHx of chronic atrial fibrillation, HTN, subacute heart failure and peripheral edema brought in by ambulance who presents to the Emergency Department complaining of moderate, constant, SOB onset two days ago. He reports being on his oxygen machine (2-3 liters) at home with minimal relief.  He notes taking Bumex, but states his medication has changed which caused him to have bilateral leg swelling. He reports associated side effects from the change in medication such as disorientation. No alleviating factors noted for leg swelling. Per pt's note, he has been seen in the ED for similar symptoms and this isn't worse as evidenced by his ability to walk to the ambulance without difficulty. Pt notes following an appropriate diet with low salt. Denies CP, dysuria, and fever. Per nurse note, PCP is Dr. Maximino Greenland.   Past Medical History:  Diagnosis Date  . Allergic rhinitis   . Asthma   . Atrial fibrillation (HCC)   . CHF (congestive heart failure) (HCC)   . Essential hypertension   . Gastroesophageal reflux disease   . Heart attack   . History of cardiac catheterization    Normal coronaries December 2016  . Noncompliance    Major problem leading to declining health and recurrent hospitalization  . Nonischemic cardiomyopathy (HCC)    LVEF 20-25%  . OSA (obstructive sleep apnea)   . Osteoarthritis   . Peptic ulcer  disease     Patient Active Problem List   Diagnosis Date Noted  . CKD (chronic kidney disease), stage II 05/17/2016  . Coronary arteries, normal Dec 2016 01/23/2016  . Chronic anticoagulation-Xarelto 01/23/2016  . NSVT (nonsustained ventricular tachycardia) (HCC) 01/23/2016  . Elevated troponin 12/21/2015  . Nonischemic cardiomyopathy (HCC)   . Morbid obesity due to excess calories (HCC)   . Noncompliance with diet and medication regimen 12/02/2015  . Acute on chronic combined systolic and diastolic heart failure (HCC) 10/08/2015  . OSA (obstructive sleep apnea) 09/20/2015  . Pulmonary hypertension 09/19/2015  . Normocytic anemia 09/19/2015  . Chronic atrial fibrillation (HCC)   . Dyspnea on exertion 05/28/2015  . Acute respiratory failure with hypoxia (HCC) 04/01/2015  . Hypokalemia 04/01/2015  . Obesity hypoventilation syndrome (HCC) 08/08/2014  . GERD (gastroesophageal reflux disease) 08/08/2014  . Atrial fibrillation (HCC) 06/28/2014  . Edema of right lower extremity 06/21/2014  . Fecal urgency 06/21/2014  . Asthma 02/11/2009  . Hyperlipidemia 07/12/2008  . OBESITY, MORBID 07/12/2008  . Anxiety state 07/12/2008  . DEPRESSION 07/12/2008  . Essential hypertension 07/12/2008  . ALLERGIC RHINITIS 07/12/2008  . Peptic ulcer 07/12/2008  . OSTEOARTHRITIS 07/12/2008    Past Surgical History:  Procedure Laterality Date  . CARDIAC CATHETERIZATION N/A 06/14/2015   Procedure: Right/Left Heart Cath and Coronary Angiography;  Surgeon: Runell Gess, MD;  Location: [facility] INVASIVE CV LAB;  Service: Cardiovascular;  Laterality: N/A;       Home Medications    Prior to  Admission medications   Medication Sig Start Date End Date Taking? Authorizing Provider  atorvastatin (LIPITOR) 40 MG tablet Take 1 tablet (40 mg total) by mouth daily at 6 PM. 04/07/16  Yes Clydia LlanoMutaz Elmahi, MD  beclomethasone (QVAR) 80 MCG/ACT inhaler Inhale 2 puffs into the lungs 2 (two) times daily as needed (for  shortness of breath).   Yes Historical Provider, MD  bumetanide (BUMEX) 2 MG tablet Take 1 tablet (2 mg total) by mouth 2 (two) times daily. 05/22/16  Yes Erick BlinksJehanzeb Memon, MD  carvedilol (COREG) 25 MG tablet Take 1 tablet (25 mg total) by mouth 2 (two) times daily with a meal. 04/07/16  Yes Clydia LlanoMutaz Elmahi, MD  docusate sodium (COLACE) 100 MG capsule Take 100 mg by mouth 2 (two) times daily.   Yes Historical Provider, MD  ferrous gluconate (FERGON) 324 MG tablet Take 324 mg by mouth 2 (two) times daily with a meal.   Yes Historical Provider, MD  lisinopril (PRINIVIL,ZESTRIL) 5 MG tablet Take 1 tablet (5 mg total) by mouth daily. 05/22/16  Yes Erick BlinksJehanzeb Memon, MD  potassium chloride SA (K-DUR,KLOR-CON) 20 MEQ tablet Take 40 mEq by mouth daily.    Yes Historical Provider, MD  rivaroxaban (XARELTO) 20 MG TABS tablet Take 20 mg by mouth daily with breakfast.   Yes Historical Provider, MD  tamsulosin (FLOMAX) 0.4 MG CAPS capsule Take 0.4 mg by mouth daily.   Yes Historical Provider, MD  spironolactone (ALDACTONE) 50 MG tablet Take 1 tablet (50 mg total) by mouth daily. 05/22/16   Erick BlinksJehanzeb Memon, MD    Family History Family History  Problem Relation Age of Onset  . Stroke Father   . Heart attack Father   . Aneurysm Mother     Cerebral aneurysm  . Hypertension Sister   . Colon cancer Neg Hx   . Inflammatory bowel disease Neg Hx   . Liver disease Neg Hx     Social History Social History  Substance Use Topics  . Smoking status: Former Smoker    Packs/day: 0.50    Years: 20.00    Types: Cigarettes    Start date: 04/26/1988    Quit date: 06/23/2007  . Smokeless tobacco: Never Used     Comment: 1 ppd former smoker  . Alcohol use No     Comment: No etoh since 2009     Allergies   Bee venom; Food; Lipitor [atorvastatin]; Aspirin; Banana; Metolazone; Orange juice [orange oil]; Torsemide; Diltiazem; and Hydralazine   Review of Systems Review of Systems  Constitutional: Negative for fever.    Respiratory: Positive for shortness of breath.   Cardiovascular: Positive for leg swelling. Negative for chest pain.  Genitourinary: Negative for dysuria.  All other systems reviewed and are negative.    Physical Exam Updated Vital Signs BP (!) 159/120   Pulse (!) 51   Temp 98.8 F (37.1 C) (Oral)   Resp 25   Ht 6' (1.829 m)   Wt 300 lb (136.1 kg)   SpO2 98%   BMI 40.69 kg/m   Physical Exam  Constitutional: He appears well-developed and well-nourished.  HENT:  Head: Normocephalic and atraumatic.  Eyes: Conjunctivae are normal. Right eye exhibits no discharge. Left eye exhibits no discharge.  Cardiovascular: Normal rate.   Tachycardic.  Pulmonary/Chest: Effort normal. No respiratory distress.  Crackles in bilateral bases. No tachypnea. No respiratory distress.   Abdominal: He exhibits distension. There is no tenderness.  Abdomen is distended but non tender.   Musculoskeletal: He exhibits edema.  Bilateral lower extremity edema. 1+ pitting to his knees.  Neurological: He is alert. Coordination normal.  Skin: Skin is warm and dry. No rash noted. He is not diaphoretic. No erythema.  Psychiatric: He has a normal mood and affect.  Nursing note and vitals reviewed.    ED Treatments / Results   DIAGNOSTIC STUDIES: Oxygen Saturation is 100% on [place], normal by my interpretation.    COORDINATION OF CARE: 9:43 PM Discussed treatment plan with pt at bedside which includes labs and pt agreed to plan.   Labs (all labs ordered are listed, but only abnormal results are displayed) Labs Reviewed  BASIC METABOLIC PANEL - Abnormal; Notable for the following:       Result Value   Calcium 8.6 (*)    All other components within normal limits  CBC - Abnormal; Notable for the following:    RBC 3.97 (*)    Hemoglobin 10.0 (*)    HCT 32.4 (*)    MCH 25.2 (*)    RDW 16.7 (*)    All other components within normal limits  TROPONIN I - Abnormal; Notable for the following:    Troponin  I 0.03 (*)    All other components within normal limits  BRAIN NATRIURETIC PEPTIDE - Abnormal; Notable for the following:    B Natriuretic Peptide 640.0 (*)    All other components within normal limits    EKG  EKG Interpretation None       Radiology Dg Chest 2 View  Result Date: 06/20/2016 CLINICAL DATA:  Shortness of breath for 2 days. EXAM: CHEST  2 VIEW COMPARISON:  06/12/2016 and 06/09/2016 FINDINGS: There is chronic cardiomegaly. There is slight pulmonary vascular congestion and slight haziness in both lungs which I suspect represents mild bilateral pulmonary edema. No discrete effusions. IMPRESSION: Findings of mild congestive heart failure. Electronically Signed   By: Francene Boyers M.D.   On: 06/20/2016 21:53    Procedures Procedures (including critical care time)  Medications Ordered in ED Medications  furosemide (LASIX) injection 80 mg (80 mg Intravenous Given 06/20/16 2208)     Initial Impression / Assessment and Plan / ED Course  I have reviewed the triage vital signs and the nursing notes.  Pertinent labs & imaging results that were available during my care of the patient were reviewed by me and considered in my medical decision making (see chart for details).  Clinical Course     Likely baseline chf status, maybe mild exacerbation. Pt overall appears well. Slightly tachy, afib but relatively rate controlled. Good diuresis with 80 IV lasix. He needs to see a cardiologist which it sound like he is working on. Stable for dc with strict return precautions.   Final Clinical Impressions(s) / ED Diagnoses   Final diagnoses:  Chronic congestive heart failure, unspecified congestive heart failure type [facility])    New Prescriptions New Prescriptions   No medications on file    I personally performed the services described in this documentation, which was scribed in my presence. The recorded information has been reviewed and is accurate.      Marily Memos,  MD 06/20/16 [phone]

## 2016-06-20 NOTE — ED Notes (Signed)
Patient transported to X-ray 

## 2016-06-20 NOTE — ED Notes (Signed)
ED Provider at bedside. 

## 2016-06-20 NOTE — ED Notes (Signed)
Son here to pick up  Pt has visual difficulties discharge summary read to pt who states he has no questions concerns regarding his discharge

## 2016-06-20 NOTE — ED Notes (Signed)
From radiology 

## 2016-06-20 NOTE — ED Triage Notes (Signed)
Pt here frequently- tonight complaint of shortness of breath for the last 2 days- Dr Maximino Greenland is his physician

## 2016-06-20 NOTE — ED Notes (Signed)
Call to [phone] for pt ride home

## 2016-06-20 NOTE — ED Notes (Signed)
Pt awaiting ride in room due to his O2 at 3L [place]

## 2016-06-22 ENCOUNTER — Emergency Department (HOSPITAL_COMMUNITY): Payer: Medicare Other

## 2016-06-22 ENCOUNTER — Observation Stay (HOSPITAL_COMMUNITY): Payer: Medicare Other

## 2016-06-22 ENCOUNTER — Encounter (HOSPITAL_COMMUNITY): Payer: Self-pay

## 2016-06-22 ENCOUNTER — Inpatient Hospital Stay (HOSPITAL_COMMUNITY)
Admission: EM | Admit: 2016-06-22 | Discharge: 2016-06-25 | DRG: 291 | Disposition: A | Discharge status: Home / Self Care | Payer: Medicare Other | Attending: Internal Medicine | Admitting: Internal Medicine

## 2016-06-22 DIAGNOSIS — I429 Cardiomyopathy, unspecified: Secondary | ICD-10-CM | POA: Diagnosis present

## 2016-06-22 DIAGNOSIS — K219 Gastro-esophageal reflux disease without esophagitis: Secondary | ICD-10-CM | POA: Diagnosis present

## 2016-06-22 DIAGNOSIS — I5043 Acute on chronic combined systolic (congestive) and diastolic (congestive) heart failure: Secondary | ICD-10-CM | POA: Diagnosis present

## 2016-06-22 DIAGNOSIS — Z87891 Personal history of nicotine dependence: Secondary | ICD-10-CM | POA: Diagnosis not present

## 2016-06-22 DIAGNOSIS — D649 Anemia, unspecified: Secondary | ICD-10-CM

## 2016-06-22 DIAGNOSIS — J45909 Unspecified asthma, uncomplicated: Secondary | ICD-10-CM | POA: Diagnosis present

## 2016-06-22 DIAGNOSIS — E876 Hypokalemia: Secondary | ICD-10-CM | POA: Diagnosis present

## 2016-06-22 DIAGNOSIS — Z9114 Patient's other noncompliance with medication regimen: Secondary | ICD-10-CM

## 2016-06-22 DIAGNOSIS — I482 Chronic atrial fibrillation: Secondary | ICD-10-CM | POA: Diagnosis present

## 2016-06-22 DIAGNOSIS — Z888 Allergy status to other drugs, medicaments and biological substances status: Secondary | ICD-10-CM | POA: Diagnosis not present

## 2016-06-22 DIAGNOSIS — N182 Chronic kidney disease, stage 2 (mild): Secondary | ICD-10-CM | POA: Diagnosis present

## 2016-06-22 DIAGNOSIS — G4733 Obstructive sleep apnea (adult) (pediatric): Secondary | ICD-10-CM | POA: Diagnosis present

## 2016-06-22 DIAGNOSIS — Z886 Allergy status to analgesic agent status: Secondary | ICD-10-CM

## 2016-06-22 DIAGNOSIS — Z823 Family history of stroke: Secondary | ICD-10-CM | POA: Diagnosis not present

## 2016-06-22 DIAGNOSIS — Z9119 Patient's noncompliance with other medical treatment and regimen: Secondary | ICD-10-CM

## 2016-06-22 DIAGNOSIS — I10 Essential (primary) hypertension: Secondary | ICD-10-CM | POA: Diagnosis present

## 2016-06-22 DIAGNOSIS — I252 Old myocardial infarction: Secondary | ICD-10-CM

## 2016-06-22 DIAGNOSIS — Z91030 Bee allergy status: Secondary | ICD-10-CM

## 2016-06-22 DIAGNOSIS — Z91018 Allergy to other foods: Secondary | ICD-10-CM

## 2016-06-22 DIAGNOSIS — Z8249 Family history of ischemic heart disease and other diseases of the circulatory system: Secondary | ICD-10-CM

## 2016-06-22 DIAGNOSIS — Z79899 Other long term (current) drug therapy: Secondary | ICD-10-CM

## 2016-06-22 DIAGNOSIS — Z7901 Long term (current) use of anticoagulants: Secondary | ICD-10-CM | POA: Diagnosis not present

## 2016-06-22 DIAGNOSIS — Z9981 Dependence on supplemental oxygen: Secondary | ICD-10-CM | POA: Diagnosis not present

## 2016-06-22 DIAGNOSIS — R748 Abnormal levels of other serum enzymes: Secondary | ICD-10-CM

## 2016-06-22 DIAGNOSIS — R7989 Other specified abnormal findings of blood chemistry: Secondary | ICD-10-CM | POA: Diagnosis present

## 2016-06-22 DIAGNOSIS — I5023 Acute on chronic systolic (congestive) heart failure: Secondary | ICD-10-CM | POA: Diagnosis not present

## 2016-06-22 DIAGNOSIS — I4891 Unspecified atrial fibrillation: Secondary | ICD-10-CM | POA: Diagnosis present

## 2016-06-22 DIAGNOSIS — Z7951 Long term (current) use of inhaled steroids: Secondary | ICD-10-CM

## 2016-06-22 DIAGNOSIS — I130 Hypertensive heart and chronic kidney disease with heart failure and stage 1 through stage 4 chronic kidney disease, or unspecified chronic kidney disease: Principal | ICD-10-CM | POA: Diagnosis present

## 2016-06-22 DIAGNOSIS — Z8711 Personal history of peptic ulcer disease: Secondary | ICD-10-CM

## 2016-06-22 DIAGNOSIS — R0602 Shortness of breath: Secondary | ICD-10-CM | POA: Diagnosis present

## 2016-06-22 DIAGNOSIS — R778 Other specified abnormalities of plasma proteins: Secondary | ICD-10-CM | POA: Diagnosis present

## 2016-06-22 LAB — ECHOCARDIOGRAM COMPLETE
HEIGHTINCHES: 72 in
WEIGHTICAEL: 4758.41 [oz_av]

## 2016-06-22 LAB — MRSA PCR SCREENING: MRSA BY PCR: NEGATIVE

## 2016-06-22 LAB — CBC WITH DIFFERENTIAL/PLATELET
Basophils Absolute: 0 10*3/uL (ref 0.0–0.1)
Basophils Relative: 0 %
Eosinophils Absolute: 0.1 10*3/uL (ref 0.0–0.7)
Eosinophils Relative: 2 %
HCT: 32.1 % — ABNORMAL LOW (ref 39.0–52.0)
Hemoglobin: 10 g/dL — ABNORMAL LOW (ref 13.0–17.0)
Lymphocytes Relative: 18 %
Lymphs Abs: 0.8 10*3/uL (ref 0.7–4.0)
MCH: 25.5 pg — ABNORMAL LOW (ref 26.0–34.0)
MCHC: 31.2 g/dL (ref 30.0–36.0)
MCV: 81.9 fL (ref 78.0–100.0)
Monocytes Absolute: 0.5 10*3/uL (ref 0.1–1.0)
Monocytes Relative: 11 %
Neutro Abs: 3.1 10*3/uL (ref 1.7–7.7)
Neutrophils Relative %: 69 %
Platelets: 234 10*3/uL (ref 150–400)
RBC: 3.92 MIL/uL — ABNORMAL LOW (ref 4.22–5.81)
RDW: 16.7 % — ABNORMAL HIGH (ref 11.5–15.5)
WBC: 4.5 10*3/uL (ref 4.0–10.5)

## 2016-06-22 LAB — BASIC METABOLIC PANEL
Anion gap: 8 (ref 5–15)
BUN: 23 mg/dL — ABNORMAL HIGH (ref 6–20)
CO2: 27 mmol/L (ref 22–32)
Calcium: 8.9 mg/dL (ref 8.9–10.3)
Chloride: 107 mmol/L (ref 101–111)
Creatinine, Ser: 1.3 mg/dL — ABNORMAL HIGH (ref 0.61–1.24)
GFR calc Af Amer: >60 mL/min (ref >60)
GFR calc non Af Amer: >60 mL/min (ref >60)
Glucose, Bld: 87 mg/dL (ref 65–99)
Potassium: 3.4 mmol/L — ABNORMAL LOW (ref 3.5–5.1)
Sodium: 142 mmol/L (ref 135–145)

## 2016-06-22 LAB — TROPONIN I: Troponin I: 0.03 ng/mL (ref <0.03)

## 2016-06-22 LAB — BRAIN NATRIURETIC PEPTIDE: B Natriuretic Peptide: 631 pg/mL — ABNORMAL HIGH (ref 0.0–100.0)

## 2016-06-22 MED ORDER — ACETAMINOPHEN 325 MG PO TABS: 650.0000 mg | ORAL_TABLET | Freq: Four times a day (QID) | ORAL | Status: DC | PRN

## 2016-06-22 MED ORDER — FUROSEMIDE 10 MG/ML IJ SOLN
80.0000 mg | Freq: Once | INTRAMUSCULAR | Status: AC
  Administered 2016-06-22: 80 mg via INTRAVENOUS
  Filled 2016-06-22: qty 8

## 2016-06-22 MED ORDER — ROSUVASTATIN CALCIUM 20 MG PO TABS
20.0000 mg | ORAL_TABLET | Freq: Every day | ORAL | Status: DC
  Administered 2016-06-22: 20 mg via ORAL
  Filled 2016-06-22 (×3): qty 1

## 2016-06-22 MED ORDER — RIVAROXABAN 20 MG PO TABS
20.0000 mg | ORAL_TABLET | Freq: Every day | ORAL | Status: DC
  Administered 2016-06-22 – 2016-06-25 (×4): 20 mg via ORAL
  Filled 2016-06-22 (×4): qty 1

## 2016-06-22 MED ORDER — ACETAMINOPHEN 650 MG RE SUPP: 650.0000 mg | Freq: Four times a day (QID) | RECTAL | Status: DC | PRN

## 2016-06-22 MED ORDER — TAMSULOSIN HCL 0.4 MG PO CAPS
0.4000 mg | ORAL_CAPSULE | Freq: Every day | ORAL | Status: DC
  Administered 2016-06-22 – 2016-06-25 (×4): 0.4 mg via ORAL
  Filled 2016-06-22 (×4): qty 1

## 2016-06-22 MED ORDER — CARVEDILOL 12.5 MG PO TABS
25.0000 mg | ORAL_TABLET | Freq: Two times a day (BID) | ORAL | Status: DC
  Administered 2016-06-22 – 2016-06-25 (×7): 25 mg via ORAL
  Filled 2016-06-22 (×7): qty 2

## 2016-06-22 MED ORDER — DOCUSATE SODIUM 100 MG PO CAPS
100.0000 mg | ORAL_CAPSULE | Freq: Two times a day (BID) | ORAL | Status: DC
  Administered 2016-06-22 – 2016-06-25 (×7): 100 mg via ORAL
  Filled 2016-06-22 (×6): qty 1

## 2016-06-22 MED ORDER — BUDESONIDE 0.25 MG/2ML IN SUSP: 2.0000 mL | Freq: Two times a day (BID) | RESPIRATORY_TRACT | Status: DC | PRN

## 2016-06-22 MED ORDER — SPIRONOLACTONE 25 MG PO TABS
50.0000 mg | ORAL_TABLET | Freq: Every day | ORAL | Status: DC
  Administered 2016-06-22 – 2016-06-25 (×4): 50 mg via ORAL
  Filled 2016-06-22 (×5): qty 2

## 2016-06-22 MED ORDER — LISINOPRIL 5 MG PO TABS
5.0000 mg | ORAL_TABLET | Freq: Every day | ORAL | Status: DC
  Administered 2016-06-22 – 2016-06-25 (×4): 5 mg via ORAL
  Filled 2016-06-22 (×4): qty 1

## 2016-06-22 MED ORDER — POTASSIUM CHLORIDE CRYS ER 20 MEQ PO TBCR
40.0000 meq | EXTENDED_RELEASE_TABLET | Freq: Every day | ORAL | Status: DC
  Administered 2016-06-22 – 2016-06-25 (×3): 40 meq via ORAL
  Filled 2016-06-22 (×3): qty 2

## 2016-06-22 MED ORDER — FERROUS GLUCONATE 324 (38 FE) MG PO TABS
324.0000 mg | ORAL_TABLET | Freq: Two times a day (BID) | ORAL | Status: DC
  Administered 2016-06-22 – 2016-06-25 (×7): 324 mg via ORAL
  Filled 2016-06-22 (×9): qty 1

## 2016-06-22 MED ORDER — FUROSEMIDE 10 MG/ML IJ SOLN
60.0000 mg | Freq: Two times a day (BID) | INTRAMUSCULAR | Status: DC
  Administered 2016-06-22 – 2016-06-25 (×7): 60 mg via INTRAVENOUS
  Filled 2016-06-22 (×7): qty 6

## 2016-06-22 NOTE — Progress Notes (Signed)
ANTICOAGULATION CONSULT NOTE - Initial Consult  Pharmacy Consult for Xarelto Indication: atrial fibrillation  Allergies  Allergen Reactions  . Bee Venom Shortness Of Breath, Swelling and Other (See Comments)    Reaction:  Facial swelling  . Food Shortness Of Breath, Rash and Other (See Comments)    Pt states that he is allergic to strawberries.    . Lipitor [Atorvastatin] Shortness Of Breath and Other (See Comments)    Reaction:  Nose bleeds   . Aspirin Other (See Comments)    Reaction:  GI upset   . Banana Diarrhea  . Metolazone Other (See Comments)    Patient states that he stopped taking due to "heart attack" like symptoms  . Orange Juice [Orange Oil] Nausea And Vomiting  . Torsemide Swelling and Other (See Comments)    Reaction:  Swelling of feet/legs   . Diltiazem Palpitations  . Hydralazine Palpitations   Patient Measurements: Height: 6' (182.9 cm) Weight: 297 lb 6.4 oz (134.9 kg) IBW/kg (Calculated) : 77.6  Vital Signs: Temp: 98.1 F (36.7 C) (01/01 0615) Temp Source: Oral (01/01 0615) BP: 153/115 (01/01 0700) Pulse Rate: 99 (01/01 0700)  Labs:  Recent Labs  06/20/16 2107 06/22/16 0242  HGB 10.0* 10.0*  HCT 32.4* 32.1*  PLT 262 234  CREATININE 0.95 1.30*  TROPONINI 0.03* 0.03*    Estimated Creatinine Clearance: 98.8 mL/min (by C-G formula based on SCr of 1.3 mg/dL (H)).   Medical History: Past Medical History:  Diagnosis Date  . Allergic rhinitis   . Asthma   . Atrial fibrillation (HCC)   . CHF (congestive heart failure) (HCC)   . Essential hypertension   . Gastroesophageal reflux disease   . Heart attack   . History of cardiac catheterization    Normal coronaries December 2016  . Noncompliance    Major problem leading to declining health and recurrent hospitalization  . Nonischemic cardiomyopathy (HCC)    LVEF 20-25%  . OSA (obstructive sleep apnea)   . Osteoarthritis   . Peptic ulcer disease    Medications:  Prescriptions Prior to  Admission  Medication Sig Dispense Refill Last Dose  . atorvastatin (LIPITOR) 40 MG tablet Take 1 tablet (40 mg total) by mouth daily at 6 PM. 30 tablet 0 06/21/2016 at Unknown time  . beclomethasone (QVAR) 80 MCG/ACT inhaler Inhale 2 puffs into the lungs 2 (two) times daily as needed (for shortness of breath).   06/21/2016 at Unknown time  . bumetanide (BUMEX) 2 MG tablet Take 1 tablet (2 mg total) by mouth 2 (two) times daily. 60 tablet 1 06/21/2016 at Unknown time  . carvedilol (COREG) 25 MG tablet Take 1 tablet (25 mg total) by mouth 2 (two) times daily with a meal. 60 tablet 1 06/21/2016 at 1900  . docusate sodium (COLACE) 100 MG capsule Take 100 mg by mouth 2 (two) times daily.   06/21/2016 at Unknown time  . ferrous gluconate (FERGON) 324 MG tablet Take 324 mg by mouth 2 (two) times daily with a meal.   06/21/2016 at Unknown time  . lisinopril (PRINIVIL,ZESTRIL) 5 MG tablet Take 1 tablet (5 mg total) by mouth daily. 30 tablet 0 06/21/2016 at Unknown time  . potassium chloride SA (K-DUR,KLOR-CON) 20 MEQ tablet Take 40 mEq by mouth daily.    06/21/2016 at Unknown time  . rivaroxaban (XARELTO) 20 MG TABS tablet Take 20 mg by mouth daily with breakfast.   unknown  . spironolactone (ALDACTONE) 50 MG tablet Take 1 tablet (50 mg total)  by mouth daily. 30 tablet 0 06/21/2016 at Unknown time  . tamsulosin (FLOMAX) 0.4 MG CAPS capsule Take 0.4 mg by mouth daily.   06/21/2016 at Unknown time   Assessment: 51yo male with h/o CHF.  Pt's home dose of Xarelto is 20mg  daily (listed above), appropriate.   Goal of Therapy:  anticoagulation Monitor platelets by anticoagulation protocol: Yes   Plan:  Resume Xarelto 20mg  po daily Monitor CBC, s/sx bleeding complications  Valrie Hart A 06/22/2016,12:01 PM

## 2016-06-22 NOTE — ED Notes (Signed)
CRITICAL VALUE ALERT  Critical value received:  TROP 0.03  Date of notification:  06/22/16  Time of notification:  0330  Critical value read back: YES  Nurse who received alert:  Wynona Dove RN  MD notified (1st page):  DR WARD  Time of first page:  0334  MD notified (2nd page):0330  Time of second page:0330 Responding MD:  DR Elesa Massed  Time MD responded: 0330

## 2016-06-22 NOTE — Progress Notes (Signed)
Patient admitted to the hospital earlier this morning by Dr. Selena Batten  Patient seen and examined. He continues to complain of shortness of breath and LE edema. On exam, lungs are clear and he has 2+ pitting edema in LE bilaterally  Patient has been admitted with acute on chronic combined CHF. He has a long history of noncompliance with salt and fluid restrictions as well as medication noncompliance. He has has had frequent admissions for decompensated CHF. He does not follow up with his outpatient cardiology appointments and has been dismissed from all the local cardiology practices due to repeatedly not coming to his appointments. On his last admission, I offered referral to heart failure clinic in [place], but patient declined. He has been admitted for diuresis. Will continue with IV diuresis for now. Continue to follow renal function.  Sarahanne Novakowski

## 2016-06-22 NOTE — Progress Notes (Signed)
*  PRELIMINARY RESULTS* Echocardiogram 2D Echocardiogram has been performed.  Stacey Drain 06/22/2016, 10:00 AM

## 2016-06-22 NOTE — ED Provider Notes (Signed)
CHIEF COMPLAINT: Shortness of breath  HPI: Patient is a 51 year old male with history of CHF (EF 20%), atrial fibrillation on Xarelto, hypertension who presents to the emergency department with complaints of increasing shortness of breath over the past 2 days. Worse with lying flat and worse with exertion. Denies chest pain or chest discomfort. Patient has increasing lower extremity swelling from his baseline. Is on 4 L of oxygen at home regularly. Reports he has had a cough and has been coughing up clear sputum. No fevers or chills. Was here yesterday for similar symptoms and states that offered admission but patient declined. Was given IV Lasix prior to discharge. States he was urinating but feels like he is getting worse.  ROS: See HPI Constitutional: no fever  Eyes: no drainage  ENT: no runny nose   Cardiovascular:  no chest pain  Resp:  SOB  GI: no vomiting GU: no dysuria Integumentary: no rash  Allergy: no hives  Musculoskeletal: no leg swelling  Neurological: no slurred speech ROS otherwise negative  PAST MEDICAL HISTORY/PAST SURGICAL HISTORY:  Past Medical History:  Diagnosis Date  . Allergic rhinitis   . Asthma   . Atrial fibrillation (HCC)   . CHF (congestive heart failure) (HCC)   . Essential hypertension   . Gastroesophageal reflux disease   . Heart attack   . History of cardiac catheterization    Normal coronaries December 2016  . Noncompliance    Major problem leading to declining health and recurrent hospitalization  . Nonischemic cardiomyopathy (HCC)    LVEF 20-25%  . OSA (obstructive sleep apnea)   . Osteoarthritis   . Peptic ulcer disease     MEDICATIONS:  Prior to Admission medications   Medication Sig Start Date End Date Taking? Authorizing Provider  atorvastatin (LIPITOR) 40 MG tablet Take 1 tablet (40 mg total) by mouth daily at 6 PM. 04/07/16   Clydia Llano, MD  beclomethasone (QVAR) 80 MCG/ACT inhaler Inhale 2 puffs into the lungs 2 (two) times  daily as needed (for shortness of breath).    Historical Provider, MD  bumetanide (BUMEX) 2 MG tablet Take 1 tablet (2 mg total) by mouth 2 (two) times daily. 05/22/16   Erick Blinks, MD  carvedilol (COREG) 25 MG tablet Take 1 tablet (25 mg total) by mouth 2 (two) times daily with a meal. 04/07/16   Clydia Llano, MD  docusate sodium (COLACE) 100 MG capsule Take 100 mg by mouth 2 (two) times daily.    Historical Provider, MD  ferrous gluconate (FERGON) 324 MG tablet Take 324 mg by mouth 2 (two) times daily with a meal.    Historical Provider, MD  lisinopril (PRINIVIL,ZESTRIL) 5 MG tablet Take 1 tablet (5 mg total) by mouth daily. 05/22/16   Erick Blinks, MD  potassium chloride SA (K-DUR,KLOR-CON) 20 MEQ tablet Take 40 mEq by mouth daily.     Historical Provider, MD  rivaroxaban (XARELTO) 20 MG TABS tablet Take 20 mg by mouth daily with breakfast.    Historical Provider, MD  spironolactone (ALDACTONE) 50 MG tablet Take 1 tablet (50 mg total) by mouth daily. 05/22/16   Erick Blinks, MD  tamsulosin (FLOMAX) 0.4 MG CAPS capsule Take 0.4 mg by mouth daily.    Historical Provider, MD    ALLERGIES:  Allergies  Allergen Reactions  . Bee Venom Shortness Of Breath, Swelling and Other (See Comments)    Reaction:  Facial swelling  . Food Shortness Of Breath, Rash and Other (See Comments)    Pt  states that he is allergic to strawberries.    . Lipitor [Atorvastatin] Shortness Of Breath and Other (See Comments)    Reaction:  Nose bleeds   . Aspirin Other (See Comments)    Reaction:  GI upset   . Banana Diarrhea  . Metolazone Other (See Comments)    Patient states that he stopped taking due to "heart attack" like symptoms  . Orange Juice [Orange Oil] Nausea And Vomiting  . Torsemide Swelling and Other (See Comments)    Reaction:  Swelling of feet/legs   . Diltiazem Palpitations  . Hydralazine Palpitations    SOCIAL HISTORY:  Social History  Substance Use Topics  . Smoking status: Former Smoker     Packs/day: 0.50    Years: 20.00    Types: Cigarettes    Start date: 04/26/1988    Quit date: 06/23/2007  . Smokeless tobacco: Never Used     Comment: 1 ppd former smoker  . Alcohol use No     Comment: No etoh since 2009    FAMILY HISTORY: Family History  Problem Relation Age of Onset  . Stroke Father   . Heart attack Father   . Aneurysm Mother     Cerebral aneurysm  . Hypertension Sister   . Colon cancer Neg Hx   . Inflammatory bowel disease Neg Hx   . Liver disease Neg Hx     EXAM: BP (!) 161/104   Pulse 109   Temp 99.1 F (37.3 C) (Oral)   Resp 16   Ht 6' (1.829 m)   Wt 300 lb (136.1 kg)   SpO2 100%   BMI 40.69 kg/m  CONSTITUTIONAL: Alert and oriented and responds appropriately to questions. Obese, chronically ill-appearing, afebrile HEAD: Normocephalic EYES: Conjunctivae clear, PERRL, EOMI ENT: normal nose; no rhinorrhea; moist mucous membranes NECK: Supple, no meningismus, no nuchal rigidity, no LAD; minimal JVD appreciated CARD: RRR; S1 and S2 appreciated; no murmurs, no clicks, no rubs, no gallops RESP: Normal chest excursion without splinting, bibasilar crackles, minimally tachypneic no hypoxia on his chronic 4 L, diminished at his bases, no wheezes or rhonchi. Patient does speaks short sentences after trying to sit upright in the bed and appears short of breath. ABD/GI: Normal bowel sounds; non-distended; soft, non-tender, no rebound, no guarding, no peritoneal signs, no hepatosplenomegaly BACK:  The back appears normal and is non-tender to palpation, there is no CVA tenderness EXT: Normal ROM in all joints; non-tender to palpation;  2+ pitting edema in bilateral axillae still level of the knee normal capillary refill; no cyanosis, no calf tenderness or swelling    SKIN: Normal color for age and race; warm; no rash NEURO: Moves all extremities equally, sensation to light touch intact diffusely, cranial nerves II through XII intact, normal speech PSYCH: The  patient's mood and manner are appropriate. Grooming and personal hygiene are appropriate.  MEDICAL DECISION MAKING:  Patient here with likely CHF exacerbation. Was seen last night for the same. Reports worsening symptoms. Does seem short of breath with minimal movement in the bed. He is mildly hypertensive but satting well on his 4 L. Appears volume overloaded on exam. Will repeat labs, chest x-ray and give IV Lasix. He denies chest pain or chest discomfort at this time.  ED PROGRESS:  Chest x-ray shows mild interstitial edema without infiltrate or effusion. BNP is 631 which is near where it was yesterday. Troponin is 0.03. We'll give 80 of IV Lasix and discuss with medicine for admission.   4:25 AM  Discussed patient's case with hospitalist, Dr. Selena Batten.  Recommend admission to telemetry, observation bed.  I will place holding orders per their request. Patient and family (if present) updated with plan. Care transferred to hospitalist service.  I reviewed all nursing notes, vitals, pertinent old records, EKGs, labs, imaging (as available).      EKG Interpretation  Date/Time:  Monday June 22 2016 02:02:10 EST Ventricular Rate:  114 PR Interval:    QRS Duration: 86 QT Interval:  374 QTC Calculation: 516 R Axis:   60 Text Interpretation:  Atrial fibrillation Borderline repolarization abnormality Prolonged QT interval No significant change since last tracing Confirmed by Anniya Whiters,  DO, Earlin Sweeden [phone]) on 06/22/2016 2:33:45 AM         Layla Maw Jimmylee Ratterree, DO 06/22/16 8295

## 2016-06-22 NOTE — H&P (Signed)
TRH H&P   Patient Demographics:    [name], is a 51 y.o. male  MRN: [id number]   DOB - 02-29-1966  Admit Date - 06/22/2016  Outpatient Primary MD for the patient is Pearson Grippe, MD, Judee Clara NP  Referring MD/NP/PA:   Dr. Elesa Massed  Outpatient Specialists:    Patient coming from: home  Chief Complaint  Patient presents with  . Shortness of Breath      HPI:    [name]  is a 51 y.o. male, w hx of CHF (EF 25%), Afib (Chads2vasc =2), OSA not on cpap, apparently c/o increase in dyspnea last nite. Slight weight gain, + orthopnea, pnd.  Pt denies medication noncompliance but has hx of this. Admits to eating some [place].   Pt denies fever, chills, cough, cp, palp, n/v, diarrhea, brbpr, black stool.  Pt presented to ED.  In Ed, CXR showed mild increase in interstitial lung markings, bnp slightly elevated beyond baseline.  Pt given lasix iv in ED, and request made for admission for CHF exacerbation.    Review of systems:    In addition to the HPI above,  No Fever-chills, No Headache, No changes with Vision or hearing, No problems swallowing food or Liquids, No Chest pain, Cough No Abdominal pain, No Nausea or Vommitting, Bowel movements are regular, No Blood in stool or Urine, No dysuria, No new skin rashes or bruises, No new joints pains-aches,  No new weakness, tingling, numbness in any extremity, No recent weight gain or loss, No polyuria, polydypsia or polyphagia, No significant Mental Stressors.  A full 10 point Review of Systems was done, except as stated above, all other Review of Systems were negative.   With Past History of the following :    Past Medical History:  Diagnosis Date  . Allergic rhinitis   . Asthma   . Atrial fibrillation (HCC)   . CHF (congestive heart failure) (HCC)   . Essential hypertension   . Gastroesophageal reflux disease   . Heart  attack   . History of cardiac catheterization    Normal coronaries December 2016  . Noncompliance    Major problem leading to declining health and recurrent hospitalization  . Nonischemic cardiomyopathy (HCC)    LVEF 20-25%  . OSA (obstructive sleep apnea)   . Osteoarthritis   . Peptic ulcer disease       Past Surgical History:  Procedure Laterality Date  . CARDIAC CATHETERIZATION N/A 06/14/2015   Procedure: Right/Left Heart Cath and Coronary Angiography;  Surgeon: Runell Gess, MD;  Location: [facility] INVASIVE CV LAB;  Service: Cardiovascular;  Laterality: N/A;      Social History:     Social History  Substance Use Topics  . Smoking status: Former Smoker    Packs/day: 0.50    Years: 20.00    Types: Cigarettes    Start date: 04/26/1988  Quit date: 06/23/2007  . Smokeless tobacco: Never Used     Comment: 1 ppd former smoker  . Alcohol use No     Comment: No etoh since 2009     Lives - at home  Mobility - walks by self   Family History :     Family History  Problem Relation Age of Onset  . Stroke Father   . Heart attack Father   . Aneurysm Mother     Cerebral aneurysm  . Hypertension Sister   . Colon cancer Neg Hx   . Inflammatory bowel disease Neg Hx   . Liver disease Neg Hx       Home Medications:   Prior to Admission medications   Medication Sig Start Date End Date Taking? Authorizing Provider  atorvastatin (LIPITOR) 40 MG tablet Take 1 tablet (40 mg total) by mouth daily at 6 PM. 04/07/16   Clydia Llano, MD  beclomethasone (QVAR) 80 MCG/ACT inhaler Inhale 2 puffs into the lungs 2 (two) times daily as needed (for shortness of breath).    Historical Provider, MD  bumetanide (BUMEX) 2 MG tablet Take 1 tablet (2 mg total) by mouth 2 (two) times daily. 05/22/16   Erick Blinks, MD  carvedilol (COREG) 25 MG tablet Take 1 tablet (25 mg total) by mouth 2 (two) times daily with a meal. 04/07/16   Clydia Llano, MD  docusate sodium (COLACE) 100 MG capsule Take 100  mg by mouth 2 (two) times daily.    Historical Provider, MD  ferrous gluconate (FERGON) 324 MG tablet Take 324 mg by mouth 2 (two) times daily with a meal.    Historical Provider, MD  lisinopril (PRINIVIL,ZESTRIL) 5 MG tablet Take 1 tablet (5 mg total) by mouth daily. 05/22/16   Erick Blinks, MD  potassium chloride SA (K-DUR,KLOR-CON) 20 MEQ tablet Take 40 mEq by mouth daily.     Historical Provider, MD  rivaroxaban (XARELTO) 20 MG TABS tablet Take 20 mg by mouth daily with breakfast.    Historical Provider, MD  spironolactone (ALDACTONE) 50 MG tablet Take 1 tablet (50 mg total) by mouth daily. 05/22/16   Erick Blinks, MD  tamsulosin (FLOMAX) 0.4 MG CAPS capsule Take 0.4 mg by mouth daily.    Historical Provider, MD     Allergies:     Allergies  Allergen Reactions  . Bee Venom Shortness Of Breath, Swelling and Other (See Comments)    Reaction:  Facial swelling  . Food Shortness Of Breath, Rash and Other (See Comments)    Pt states that he is allergic to strawberries.    . Lipitor [Atorvastatin] Shortness Of Breath and Other (See Comments)    Reaction:  Nose bleeds   . Aspirin Other (See Comments)    Reaction:  GI upset   . Banana Diarrhea  . Metolazone Other (See Comments)    Patient states that he stopped taking due to "heart attack" like symptoms  . Orange Juice [Orange Oil] Nausea And Vomiting  . Torsemide Swelling and Other (See Comments)    Reaction:  Swelling of feet/legs   . Diltiazem Palpitations  . Hydralazine Palpitations     Physical Exam:   Vitals  Blood pressure (!) 138/110, pulse (!) 52, temperature 99.1 F (37.3 C), temperature source Oral, resp. rate 16, height 6' (1.829 m), weight 136.1 kg (300 lb), SpO2 95 %.   1. General  lying in bed in NAD,    2. Normal affect and insight, Not Suicidal or Homicidal, Awake  Alert, Oriented X 3.  3. No F.N deficits, ALL C.Nerves Intact, Strength 5/5 all 4 extremities, Sensation intact all 4 extremities, Plantars down  going.  4. Ears and Eyes appear Normal, Conjunctivae clear, PERRLA. Moist Oral Mucosa.  5. Supple Neck, + JVD, No cervical lymphadenopathy appriciated, No Carotid Bruits.  6. Symmetrical Chest wall movement, Good air movement bilaterally, slight basilar crackles,  No wheezing  7. RRR, No Gallops, Rubs or Murmurs, No Parasternal Heave.  8. Positive Bowel Sounds, Abdomen Soft, No tenderness, No organomegaly appriciated,No rebound -guarding or rigidity.  9.  No Cyanosis, Normal Skin Turgor, No Skin Rash or Bruise.  10. Good muscle tone,  joints appear normal , no effusions, Normal ROM.  11. No Palpable Lymph Nodes in Neck or Axillae     Data Review:    CBC  Recent Labs Lab 06/20/16 2107 06/22/16 0242  WBC 4.6 4.5  HGB 10.0* 10.0*  HCT 32.4* 32.1*  PLT 262 234  MCV 81.6 81.9  MCH 25.2* 25.5*  MCHC 30.9 31.2  RDW 16.7* 16.7*  LYMPHSABS  --  0.8  MONOABS  --  0.5  EOSABS  --  0.1  BASOSABS  --  0.0   ------------------------------------------------------------------------------------------------------------------  Chemistries   Recent Labs Lab 06/20/16 2107 06/22/16 0242  NA 139 142  K 3.8 3.4*  CL 109 107  CO2 25 27  GLUCOSE 95 87  BUN 17 23*  CREATININE 0.95 1.30*  CALCIUM 8.6* 8.9   ------------------------------------------------------------------------------------------------------------------ estimated creatinine clearance is 99.3 mL/min (by C-G formula based on SCr of 1.3 mg/dL (H)). ------------------------------------------------------------------------------------------------------------------ No results for input(s): TSH, T4TOTAL, T3FREE, THYROIDAB in the last 72 hours.  Invalid input(s): FREET3  Coagulation profile No results for input(s): INR, PROTIME in the last 168 hours. ------------------------------------------------------------------------------------------------------------------- No results for input(s): DDIMER in the last 72  hours. -------------------------------------------------------------------------------------------------------------------  Cardiac Enzymes  Recent Labs Lab 06/20/16 2107 06/22/16 0242  TROPONINI 0.03* 0.03*   ------------------------------------------------------------------------------------------------------------------    Component Value Date/Time   BNP 631.0 (H) 06/22/2016 0242     ---------------------------------------------------------------------------------------------------------------  Urinalysis    Component Value Date/Time   COLORURINE YELLOW 05/01/2016 2104   APPEARANCEUR CLEAR 05/01/2016 2104   LABSPEC 1.025 05/01/2016 2104   PHURINE 6.0 05/01/2016 2104   GLUCOSEU NEGATIVE 05/01/2016 2104   GLUCOSEU NEG mg/dL 16/03/9603 5409   HGBUR NEGATIVE 05/01/2016 2104   BILIRUBINUR NEGATIVE 05/01/2016 2104   KETONESUR NEGATIVE 05/01/2016 2104   PROTEINUR 100 (A) 05/01/2016 2104   UROBILINOGEN 0.2 08/08/2014 1445   NITRITE NEGATIVE 05/01/2016 2104   LEUKOCYTESUR NEGATIVE 05/01/2016 2104    ----------------------------------------------------------------------------------------------------------------   Imaging Results:    Dg Chest 2 View  Result Date: 06/20/2016 CLINICAL DATA:  Shortness of breath for 2 days. EXAM: CHEST  2 VIEW COMPARISON:  06/12/2016 and 06/09/2016 FINDINGS: There is chronic cardiomegaly. There is slight pulmonary vascular congestion and slight haziness in both lungs which I suspect represents mild bilateral pulmonary edema. No discrete effusions. IMPRESSION: Findings of mild congestive heart failure. Electronically Signed   By: Francene Boyers M.D.   On: 06/20/2016 21:53   Dg Chest Portable 1 View  Result Date: 06/22/2016 CLINICAL DATA:  Shortness of breath for 3 days. History of asthma, cardiomyopathy. EXAM: PORTABLE CHEST 1 VIEW COMPARISON:  Chest radiograph June 20, 2016 FINDINGS: The cardiac silhouette is moderate to severely enlarged,  unchanged. Mediastinal silhouette is nonsuspicious. Pulmonary vascular congestion and interstitial prominence without pleural effusion or focal consolidation. No pneumothorax. Soft tissue planes and included osseous structures are  nonsuspicious. Large body habitus. IMPRESSION: Stable cardiomegaly and mild interstitial edema. Electronically Signed   By: Awilda Metro M.D.   On: 06/22/2016 02:31      Assessment & Plan:    Active Problems:   Hypokalemia   Normocytic anemia   Elevated troponin   CHF exacerbation (HCC)    1.  CHF exacerbation (EF 25%) Lasix iv Check daily weight, strict I and O Pt counselled on low sodium diet and checking daily weight.   2. Trop elevation Likely demand check trop I q6h x3 Check cardiac echo  3. Hypokalemia Replete Check cmp in am  4. Anemia Check cbc in am    DVT Prophylaxis  Heparin, SCDs   AM Labs Ordered, also please review Full Orders  Family Communication: Admission, patients condition and plan of care including tests being ordered have been discussed with the patient  who indicate understanding and agree with the plan and Code Status.  Code Status FULL CODE  Likely DC to  home  Condition GUARDED    Consults called: none  Admission status: observation  Time spent in minutes : 45 minutes   Pearson Grippe M.D on 06/22/2016 at 5:27 AM  Between 7am to 7pm - Pager - [phone] After 7pm go to [email or web address] - password [facility]  [facility] - Office  [phone]

## 2016-06-22 NOTE — Discharge Instructions (Signed)

## 2016-06-22 NOTE — ED Triage Notes (Signed)
Pt was seen here yesterday and was to be admitted but pt refused and now returns for same.

## 2016-06-23 DIAGNOSIS — I482 Chronic atrial fibrillation: Secondary | ICD-10-CM

## 2016-06-23 DIAGNOSIS — I10 Essential (primary) hypertension: Secondary | ICD-10-CM

## 2016-06-23 DIAGNOSIS — N182 Chronic kidney disease, stage 2 (mild): Secondary | ICD-10-CM

## 2016-06-23 DIAGNOSIS — I5043 Acute on chronic combined systolic (congestive) and diastolic (congestive) heart failure: Secondary | ICD-10-CM

## 2016-06-23 LAB — COMPREHENSIVE METABOLIC PANEL
ALT: 16 U/L — ABNORMAL LOW (ref 17–63)
ANION GAP: 7 (ref 5–15)
AST: 20 U/L (ref 15–41)
Albumin: 3.3 g/dL — ABNORMAL LOW (ref 3.5–5.0)
Alkaline Phosphatase: 63 U/L (ref 38–126)
BUN: 25 mg/dL — ABNORMAL HIGH (ref 6–20)
CHLORIDE: 103 mmol/L (ref 101–111)
CO2: 29 mmol/L (ref 22–32)
Calcium: 8.3 mg/dL — ABNORMAL LOW (ref 8.9–10.3)
Creatinine, Ser: 1.22 mg/dL (ref 0.61–1.24)
GFR calc Af Amer: >60 mL/min (ref >60)
GFR calc non Af Amer: >60 mL/min (ref >60)
GLUCOSE: 95 mg/dL (ref 65–99)
POTASSIUM: 3.1 mmol/L — AB (ref 3.5–5.1)
SODIUM: 139 mmol/L (ref 135–145)
TOTAL PROTEIN: 6.1 g/dL — AB (ref 6.5–8.1)
Total Bilirubin: 1.7 mg/dL — ABNORMAL HIGH (ref 0.3–1.2)

## 2016-06-23 LAB — CBC
HEMATOCRIT: 32.2 % — AB (ref 39.0–52.0)
HEMOGLOBIN: 9.9 g/dL — AB (ref 13.0–17.0)
MCH: 25.1 pg — AB (ref 26.0–34.0)
MCHC: 30.7 g/dL (ref 30.0–36.0)
MCV: 81.7 fL (ref 78.0–100.0)
Platelets: 252 10*3/uL (ref 150–400)
RBC: 3.94 MIL/uL — ABNORMAL LOW (ref 4.22–5.81)
RDW: 16.4 % — ABNORMAL HIGH (ref 11.5–15.5)
WBC: 3.5 10*3/uL — ABNORMAL LOW (ref 4.0–10.5)

## 2016-06-23 MED ORDER — ALUM & MAG HYDROXIDE-SIMETH 200-200-20 MG/5ML PO SUSP
30.0000 mL | ORAL | Status: DC | PRN
  Administered 2016-06-23: 30 mL via ORAL
  Filled 2016-06-23: qty 30

## 2016-06-23 MED ORDER — POTASSIUM CHLORIDE CRYS ER 20 MEQ PO TBCR
40.0000 meq | EXTENDED_RELEASE_TABLET | ORAL | Status: AC
  Administered 2016-06-23 (×2): 40 meq via ORAL
  Filled 2016-06-23 (×2): qty 2

## 2016-06-23 NOTE — Progress Notes (Signed)
PROGRESS NOTE    [name]  MVE:[id number] DOB: Dec 05, 1965 DOA: 06/22/2016 PCP: Pearson Grippe, MD    Brief Narrative:  51 year old male with a history of congestive heart failure, multiple admissions for decompensated CHF due to noncompliance with dietary restrictions and medication noncompliance, presents with shortness of breath and volume overload. He is admitted for IV diuresis.   Assessment & Plan:   Active Problems:   Essential hypertension   Atrial fibrillation (HCC)   Hypokalemia   Normocytic anemia   Acute on chronic combined systolic and diastolic heart failure (HCC)   Elevated troponin   CKD (chronic kidney disease), stage II   1. Acute on chronic combined systolic and diastolic heart failure. Ejection fraction of 30-35%. Patient is on beta blockers, ACE inhibitor, spironolactone. He is currently on intravenous Lasix. Last discharge weight was 278 pounds. On admission, he was noted to be at 300 pounds. He has good urine output with IV Lasix. His recurrent CHF is due to noncompliance with dietary restrictions and medication noncompliance. Will need continued IV diuresis at this point.  2. Chronic atrial fibrillation. Rate controlled. He is anticoagulated with xarelto  3. Hypokalemia. Related to diuresis. Replace  4. CKD stage II. Continue to monitor creatinine in the setting of diuresis. Currently stable.  5. Hypertension. Stable. Continue current medications.  6. Anemia. Chronic . appears to be stable.   DVT prophylaxis: xarelto Code Status: full code Family Communication: discussed with patient Disposition Plan: discharge home once improved   Consultants:     Procedures:     Antimicrobials:       Subjective: Feels breathing is improving, LE edema is improving  Objective: Vitals:   06/22/16 1400 06/22/16 2100 06/23/16 0429 06/23/16 0930  BP: 108/70 110/66 117/87 112/67  Pulse: 77 83 69   Resp: 18 18 18    Temp: 97.4 F (36.3 C) 97.7 F (36.5  C) 97.9 F (36.6 C)   TempSrc: Oral Oral Oral   SpO2: 98% 97% 100%   Weight:   133.2 kg (293 lb 9.6 oz)   Height:   6' (1.829 m)     Intake/Output Summary (Last 24 hours) at 06/23/16 1128 Last data filed at 06/23/16 0900  Gross per 24 hour  Intake              720 ml  Output             4000 ml  Net            -3280 ml   Filed Weights   06/22/16 0207 06/22/16 0615 06/23/16 0429  Weight: 136.1 kg (300 lb) 134.9 kg (297 lb 6.4 oz) 133.2 kg (293 lb 9.6 oz)    Examination:  General exam: Appears calm and comfortable  Respiratory system: Clear to auscultation. Respiratory effort normal. Cardiovascular system: S1 & S2 heard, irregular. No JVD, murmurs, rubs, gallops or clicks. 2+ pedal edema. Gastrointestinal system: Abdomen is nondistended, soft and nontender. No organomegaly or masses felt. Normal bowel sounds heard. Central nervous system: Alert and oriented. No focal neurological deficits. Extremities: Symmetric 5 x 5 power. Skin: No rashes, lesions or ulcers Psychiatry: Judgement and insight appear normal. Mood & affect appropriate.     Data Reviewed: I have personally reviewed following labs and imaging studies  CBC:  Recent Labs Lab 06/20/16 2107 06/22/16 0242 06/23/16 0554  WBC 4.6 4.5 3.5*  NEUTROABS  --  3.1  --   HGB 10.0* 10.0* 9.9*  HCT 32.4* 32.1* 32.2*  MCV 81.6 81.9 81.7  PLT 262 234 252   Basic Metabolic Panel:  Recent Labs Lab 06/20/16 2107 06/22/16 0242 06/23/16 0554  NA 139 142 139  K 3.8 3.4* 3.1*  CL 109 107 103  CO2 25 27 29   GLUCOSE 95 87 95  BUN 17 23* 25*  CREATININE 0.95 1.30* 1.22  CALCIUM 8.6* 8.9 8.3*   GFR: Estimated Creatinine Clearance: 104.5 mL/min (by C-G formula based on SCr of 1.22 mg/dL). Liver Function Tests:  Recent Labs Lab 06/23/16 0554  AST 20  ALT 16*  ALKPHOS 63  BILITOT 1.7*  PROT 6.1*  ALBUMIN 3.3*   No results for input(s): LIPASE, AMYLASE in the last 168 hours. No results for input(s): AMMONIA  in the last 168 hours. Coagulation Profile: No results for input(s): INR, PROTIME in the last 168 hours. Cardiac Enzymes:  Recent Labs Lab 06/20/16 2107 06/22/16 0242  TROPONINI 0.03* 0.03*   BNP (last 3 results) No results for input(s): PROBNP in the last 8760 hours. HbA1C: No results for input(s): HGBA1C in the last 72 hours. CBG: No results for input(s): GLUCAP in the last 168 hours. Lipid Profile: No results for input(s): CHOL, HDL, LDLCALC, TRIG, CHOLHDL, LDLDIRECT in the last 72 hours. Thyroid Function Tests: No results for input(s): TSH, T4TOTAL, FREET4, T3FREE, THYROIDAB in the last 72 hours. Anemia Panel: No results for input(s): VITAMINB12, FOLATE, FERRITIN, TIBC, IRON, RETICCTPCT in the last 72 hours. Sepsis Labs: No results for input(s): PROCALCITON, LATICACIDVEN in the last 168 hours.  Recent Results (from the past 240 hour(s))  MRSA PCR Screening     Status: None   Collection Time: 06/22/16  6:04 AM  Result Value Ref Range Status   MRSA by PCR NEGATIVE NEGATIVE Final    Comment:        The GeneXpert MRSA Assay (FDA approved for NASAL specimens only), is one component of a comprehensive MRSA colonization surveillance program. It is not intended to diagnose MRSA infection nor to guide or monitor treatment for MRSA infections.          Radiology Studies: Dg Chest Portable 1 View  Result Date: 06/22/2016 CLINICAL DATA:  Shortness of breath for 3 days. History of asthma, cardiomyopathy. EXAM: PORTABLE CHEST 1 VIEW COMPARISON:  Chest radiograph June 20, 2016 FINDINGS: The cardiac silhouette is moderate to severely enlarged, unchanged. Mediastinal silhouette is nonsuspicious. Pulmonary vascular congestion and interstitial prominence without pleural effusion or focal consolidation. No pneumothorax. Soft tissue planes and included osseous structures are nonsuspicious. Large body habitus. IMPRESSION: Stable cardiomegaly and mild interstitial edema.  Electronically Signed   By: Awilda Metro M.D.   On: 06/22/2016 02:31        Scheduled Meds: . carvedilol  25 mg Oral BID WC  . docusate sodium  100 mg Oral BID  . ferrous gluconate  324 mg Oral BID WC  . furosemide  60 mg Intravenous BID  . lisinopril  5 mg Oral Daily  . potassium chloride SA  40 mEq Oral Daily  . potassium chloride  40 mEq Oral Q3H  . rivaroxaban  20 mg Oral Daily  . rosuvastatin  20 mg Oral q1800  . spironolactone  50 mg Oral Daily  . tamsulosin  0.4 mg Oral Daily   Continuous Infusions:   LOS: 1 day    Time spent:    Min Tunnell, MD [facility] Pager [phone]  If 7PM-7AM, please contact night-coverage [email or web address] Password [email or web address] 06/23/2016, 11:28 AM

## 2016-06-24 LAB — BASIC METABOLIC PANEL
Anion gap: 8 (ref 5–15)
BUN: 25 mg/dL — ABNORMAL HIGH (ref 6–20)
CO2: 27 mmol/L (ref 22–32)
Calcium: 8.5 mg/dL — ABNORMAL LOW (ref 8.9–10.3)
Chloride: 104 mmol/L (ref 101–111)
Creatinine, Ser: 1.29 mg/dL — ABNORMAL HIGH (ref 0.61–1.24)
GFR calc Af Amer: >60 mL/min (ref >60)
GFR calc non Af Amer: >60 mL/min (ref >60)
Glucose, Bld: 90 mg/dL (ref 65–99)
Potassium: 3.5 mmol/L (ref 3.5–5.1)
Sodium: 139 mmol/L (ref 135–145)

## 2016-06-24 MED ORDER — PANTOPRAZOLE SODIUM 40 MG PO TBEC
40.0000 mg | DELAYED_RELEASE_TABLET | Freq: Every day | ORAL | Status: DC
  Administered 2016-06-24 – 2016-06-25 (×2): 40 mg via ORAL
  Filled 2016-06-24 (×2): qty 1

## 2016-06-24 MED ORDER — BISMUTH SUBSALICYLATE 262 MG/15ML PO SUSP
30.0000 mL | ORAL | Status: DC | PRN
  Filled 2016-06-24: qty 118

## 2016-06-24 NOTE — Progress Notes (Signed)
PROGRESS NOTE    Samuel BESERRA  HQP:[id number] DOB: 10-11-1965 DOA: 06/22/2016 PCP: Pearson Grippe, MD    Brief Narrative:  51 year old male with a history of congestive heart failure, multiple admissions for decompensated CHF due to noncompliance with dietary restrictions and medication noncompliance, presents with shortness of breath and volume overload. He is admitted for IV diuresis.   Assessment & Plan:   Active Problems:   Essential hypertension   Atrial fibrillation (HCC)   Hypokalemia   Normocytic anemia   Acute on chronic combined systolic and diastolic heart failure (HCC)   Elevated troponin   CKD (chronic kidney disease), stage II   1. Acute on chronic combined systolic and diastolic heart failure. Ejection fraction of 30-35%. Patient is on beta blockers, ACE inhibitor, spironolactone. He is currently on intravenous Lasix. Last discharge weight was 278 pounds. On admission, he was noted to be at 300 pounds,  Today is 293. He has good urine output with IV Lasix. His recurrent CHF is due to noncompliance with dietary restrictions and medication noncompliance. Resume IV lasix for another 24 hours. Watch creatinine on IV lasix.   2. Chronic atrial fibrillation. Rate controlled. He is anticoagulated with xarelto  3. Hypokalemia. Related to diuresis. Replaced,   4. CKD stage II. Continue to monitor creatinine in the setting of diuresis. Currently stable.  5. Hypertension. Stable. Continue current medications.  6. Anemia. Chronic . appears to be stable.   DVT prophylaxis: xarelto Code Status: full code Family Communication: discussed with patient Disposition Plan: discharge home once improved    Consultants:   none  Procedures:   none  Antimicrobials:    none   Subjective: Feels breathing is improving, LE edema is improving, but not back to baseline.   Objective: Vitals:   06/23/16 1500 06/24/16 0645 06/24/16 1614 06/24/16 1719  BP: 113/77 115/78 (!) 83/59  114/61  Pulse: 87 89 66 78  Resp: 20 17    Temp: 98.1 F (36.7 C) 97.4 F (36.3 C)    TempSrc: Oral Oral    SpO2: 100% 100% 97%   Weight:  133.2 kg (293 lb 8.8 oz)    Height:        Intake/Output Summary (Last 24 hours) at 06/24/16 1829 Last data filed at 06/24/16 1746  Gross per 24 hour  Intake                0 ml  Output             3300 ml  Net            -3300 ml   Filed Weights   06/22/16 0615 06/23/16 0429 06/24/16 0645  Weight: 134.9 kg (297 lb 6.4 oz) 133.2 kg (293 lb 9.6 oz) 133.2 kg (293 lb 8.8 oz)    Examination:  General exam: Appears calm and comfortable  Respiratory system: Clear to auscultation. Respiratory effort normal. Cardiovascular system: S1 & S2 heard, irregular. No JVD, murmurs, rubs, gallops or clicks. 2+ pedal edema. Gastrointestinal system: Abdomen is nondistended, soft and nontender. No organomegaly or masses felt. Normal bowel sounds heard. Central nervous system: Alert and oriented. No focal neurological deficits. Extremities: Symmetric 5 x 5 power. Skin: No rashes, lesions or ulcers Psychiatry: Judgement and insight appear normal. Mood & affect appropriate.     Data Reviewed: I have personally reviewed following labs and imaging studies  CBC:  Recent Labs Lab 06/20/16 2107 06/22/16 0242 06/23/16 0554  WBC 4.6 4.5 3.5*  NEUTROABS  --  3.1  --   HGB 10.0* 10.0* 9.9*  HCT 32.4* 32.1* 32.2*  MCV 81.6 81.9 81.7  PLT 262 234 252   Basic Metabolic Panel:  Recent Labs Lab 06/20/16 2107 06/22/16 0242 06/23/16 0554 06/24/16 0555  NA 139 142 139 139  K 3.8 3.4* 3.1* 3.5  CL 109 107 103 104  CO2 25 27 29 27   GLUCOSE 95 87 95 90  BUN 17 23* 25* 25*  CREATININE 0.95 1.30* 1.22 1.29*  CALCIUM 8.6* 8.9 8.3* 8.5*   GFR: Estimated Creatinine Clearance: 98.9 mL/min (by C-G formula based on SCr of 1.29 mg/dL (H)). Liver Function Tests:  Recent Labs Lab 06/23/16 0554  AST 20  ALT 16*  ALKPHOS 63  BILITOT 1.7*  PROT 6.1*    ALBUMIN 3.3*   No results for input(s): LIPASE, AMYLASE in the last 168 hours. No results for input(s): AMMONIA in the last 168 hours. Coagulation Profile: No results for input(s): INR, PROTIME in the last 168 hours. Cardiac Enzymes:  Recent Labs Lab 06/20/16 2107 06/22/16 0242  TROPONINI 0.03* 0.03*   BNP (last 3 results) No results for input(s): PROBNP in the last 8760 hours. HbA1C: No results for input(s): HGBA1C in the last 72 hours. CBG: No results for input(s): GLUCAP in the last 168 hours. Lipid Profile: No results for input(s): CHOL, HDL, LDLCALC, TRIG, CHOLHDL, LDLDIRECT in the last 72 hours. Thyroid Function Tests: No results for input(s): TSH, T4TOTAL, FREET4, T3FREE, THYROIDAB in the last 72 hours. Anemia Panel: No results for input(s): VITAMINB12, FOLATE, FERRITIN, TIBC, IRON, RETICCTPCT in the last 72 hours. Sepsis Labs: No results for input(s): PROCALCITON, LATICACIDVEN in the last 168 hours.  Recent Results (from the past 240 hour(s))  MRSA PCR Screening     Status: None   Collection Time: 06/22/16  6:04 AM  Result Value Ref Range Status   MRSA by PCR NEGATIVE NEGATIVE Final    Comment:        The GeneXpert MRSA Assay (FDA approved for NASAL specimens only), is one component of a comprehensive MRSA colonization surveillance program. It is not intended to diagnose MRSA infection nor to guide or monitor treatment for MRSA infections.          Radiology Studies: No results found.      Scheduled Meds: . carvedilol  25 mg Oral BID WC  . docusate sodium  100 mg Oral BID  . ferrous gluconate  324 mg Oral BID WC  . furosemide  60 mg Intravenous BID  . lisinopril  5 mg Oral Daily  . pantoprazole  40 mg Oral Q0600  . potassium chloride SA  40 mEq Oral Daily  . rivaroxaban  20 mg Oral Daily  . rosuvastatin  20 mg Oral q1800  . spironolactone  50 mg Oral Daily  . tamsulosin  0.4 mg Oral Daily   Continuous Infusions:   LOS: 2 days     Time spent:    Carold Eisner, MD [facility] Pager [phone] If 7PM-7AM, please contact night-coverage [email or web address] Password [facility] 06/24/2016, 6:29 PM

## 2016-06-25 LAB — BASIC METABOLIC PANEL
Anion gap: 4 — ABNORMAL LOW (ref 5–15)
BUN: 23 mg/dL — ABNORMAL HIGH (ref 6–20)
CHLORIDE: 106 mmol/L (ref 101–111)
CO2: 28 mmol/L (ref 22–32)
Calcium: 8.5 mg/dL — ABNORMAL LOW (ref 8.9–10.3)
Creatinine, Ser: 1.31 mg/dL — ABNORMAL HIGH (ref 0.61–1.24)
GFR calc non Af Amer: >60 mL/min (ref >60)
Glucose, Bld: 111 mg/dL — ABNORMAL HIGH (ref 65–99)
POTASSIUM: 3.6 mmol/L (ref 3.5–5.1)
SODIUM: 138 mmol/L (ref 135–145)

## 2016-06-25 MED ORDER — SPIRONOLACTONE 50 MG PO TABS: 50.0000 mg | ORAL_TABLET | Freq: Every day | ORAL | 0 refills | Status: DC

## 2016-06-25 MED ORDER — FERROUS GLUCONATE 324 (38 FE) MG PO TABS: 324.0000 mg | ORAL_TABLET | Freq: Two times a day (BID) | ORAL | 0 refills | Status: DC

## 2016-06-25 MED ORDER — DOCUSATE SODIUM 100 MG PO CAPS: 100.0000 mg | ORAL_CAPSULE | Freq: Two times a day (BID) | ORAL | 0 refills | Status: DC

## 2016-06-25 MED ORDER — FUROSEMIDE 40 MG PO TABS: 40.0000 mg | ORAL_TABLET | Freq: Two times a day (BID) | ORAL | 0 refills | Status: DC

## 2016-06-25 MED ORDER — POTASSIUM CHLORIDE CRYS ER 20 MEQ PO TBCR: 40.0000 meq | EXTENDED_RELEASE_TABLET | Freq: Every day | ORAL | 0 refills | Status: DC

## 2016-06-25 MED ORDER — ATORVASTATIN CALCIUM 40 MG PO TABS: 40.0000 mg | ORAL_TABLET | Freq: Every day | ORAL | 0 refills | Status: DC

## 2016-06-25 MED ORDER — RIVAROXABAN 20 MG PO TABS: 20.0000 mg | ORAL_TABLET | Freq: Every day | ORAL | 0 refills | Status: DC

## 2016-06-25 MED ORDER — CARVEDILOL 25 MG PO TABS: 25.0000 mg | ORAL_TABLET | Freq: Two times a day (BID) | ORAL | 1 refills | Status: DC

## 2016-06-25 MED ORDER — TAMSULOSIN HCL 0.4 MG PO CAPS: 0.4000 mg | ORAL_CAPSULE | Freq: Every day | ORAL | 0 refills | Status: AC

## 2016-06-25 MED ORDER — LISINOPRIL 5 MG PO TABS: 5.0000 mg | ORAL_TABLET | Freq: Every day | ORAL | 0 refills | Status: DC

## 2016-06-25 MED ORDER — PANTOPRAZOLE SODIUM 40 MG PO TBEC: 40.0000 mg | DELAYED_RELEASE_TABLET | Freq: Every day | ORAL | 0 refills | Status: AC

## 2016-06-25 NOTE — Discharge Summary (Signed)
Physician Discharge Summary  TARYLL REICHENBERGER ZOX:[id number] DOB: 1966/02/08 DOA: 06/22/2016  PCP: Pearson Grippe, MD  Admit date: 06/22/2016 Discharge date: 06/25/2016  Admitted From:Home.  Disposition:  Home.   Recommendations for Outpatient Follow-up:  1. Follow up with PCP in 1-2 weeks 2. Please obtain BMP/CBC in one week 3. Please follow up with cardiology as recommended.     Discharge Condition:stable.  CODE STATUS:full code.  Diet recommendation: Heart Healthy   Brief/Interim Summary: 51 year old male with a history of congestive heart failure, multiple admissions for decompensated CHF due to noncompliance with dietary restrictions and medication noncompliance, presents with shortness of breath and volume overload. He is admitted for IV diuresis.   Discharge Diagnoses:  Active Problems:   Essential hypertension   Atrial fibrillation (HCC)   Hypokalemia   Normocytic anemia   Acute on chronic combined systolic and diastolic heart failure (HCC)   Elevated troponin   CKD (chronic kidney disease), stage II   1. Acute on chronic combined systolic and diastolic heart failure. Ejection fraction of 30-35%. Patient is on beta blockers, ACE inhibitor, spironolactone. He is currently on intravenous Lasix. Last discharge weight was 278 pounds. On admission, he was noted to be at 300 pounds,  Today is 284.  He has good urine output with IV Lasix. His recurrent CHF is due to noncompliance with dietary restrictions and medication noncompliance. Discharged home on oral lasix tofollow up with heart failure clinic and cardiology in one week.   2. Chronic atrial fibrillation. Rate controlled. He is anticoagulated with xarelto  3. Hypokalemia. Related to diuresis. Replaced,   4. CKD stage II. Continue to monitor creatinine in the setting of diuresis. Currently stable.  5. Hypertension. Stable. Continue current medications.  6. Anemia. Chronic . appears to be stable.   Discharge  Instructions  Discharge Instructions    (HEART FAILURE PATIENTS) Call MD:  Anytime you have any of the following symptoms: 1) 3 pound weight gain in 24 hours or 5 pounds in 1 week 2) shortness of breath, with or without a dry hacking cough 3) swelling in the hands, feet or stomach 4) if you have to sleep on extra pillows at night in order to breathe.    Complete by:  As directed    AMB Referral to HF Clinic    Complete by:  As directed    Diet - low sodium heart healthy    Complete by:  As directed    Discharge instructions    Complete by:  As directed    Please follow up with cardiology in one week.     Allergies as of 06/25/2016      Reactions   Bee Venom Shortness Of Breath, Swelling, Other (See Comments)   Reaction:  Facial swelling   Food Shortness Of Breath, Rash, Other (See Comments)   Pt states that he is allergic to strawberries.     Lipitor [atorvastatin] Shortness Of Breath, Other (See Comments)   Reaction:  Nose bleeds    Aspirin Other (See Comments)   Reaction:  GI upset    Banana Diarrhea   Metolazone Other (See Comments)   Patient states that he stopped taking due to "heart attack" like symptoms   Orange Juice [orange Oil] Nausea And Vomiting   Torsemide Swelling, Other (See Comments)   Reaction:  Swelling of feet/legs    Diltiazem Palpitations   Hydralazine Palpitations      Medication List    TAKE these medications   atorvastatin 40 MG  tablet Commonly known as:  LIPITOR Take 1 tablet (40 mg total) by mouth daily at 6 PM.   beclomethasone 80 MCG/ACT inhaler Commonly known as:  QVAR Inhale 2 puffs into the lungs 2 (two) times daily as needed (for shortness of breath).   bumetanide 2 MG tablet Commonly known as:  BUMEX Take 1 tablet (2 mg total) by mouth 2 (two) times daily.   carvedilol 25 MG tablet Commonly known as:  COREG Take 1 tablet (25 mg total) by mouth 2 (two) times daily with a meal.   docusate sodium 100 MG capsule Commonly known as:   COLACE Take 1 capsule (100 mg total) by mouth 2 (two) times daily.   ferrous gluconate 324 MG tablet Commonly known as:  FERGON Take 1 tablet (324 mg total) by mouth 2 (two) times daily with a meal.   furosemide 40 MG tablet Commonly known as:  LASIX Take 1 tablet (40 mg total) by mouth 2 (two) times daily.   lisinopril 5 MG tablet Commonly known as:  PRINIVIL,ZESTRIL Take 1 tablet (5 mg total) by mouth daily.   pantoprazole 40 MG tablet Commonly known as:  PROTONIX Take 1 tablet (40 mg total) by mouth daily at 6 (six) AM. Start taking on:  06/26/2016   potassium chloride SA 20 MEQ tablet Commonly known as:  K-DUR,KLOR-CON Take 2 tablets (40 mEq total) by mouth daily.   rivaroxaban 20 MG Tabs tablet Commonly known as:  XARELTO Take 1 tablet (20 mg total) by mouth daily with breakfast. Start taking on:  06/26/2016   spironolactone 50 MG tablet Commonly known as:  ALDACTONE Take 1 tablet (50 mg total) by mouth daily.   tamsulosin 0.4 MG Caps capsule Commonly known as:  FLOMAX Take 1 capsule (0.4 mg total) by mouth daily.      Follow-up Information    [facility]. Schedule an appointment as soon as possible for a visit in 1 week(s).   Specialty:  Cardiology Contact information: [address] [place] [address] [phone]       Pearson Grippe, MD. Schedule an appointment as soon as possible for a visit in 2 week(s).   Specialty:  Internal Medicine Contact information: [address] [place] [address] [phone]          Allergies  Allergen Reactions  . Bee Venom Shortness Of Breath, Swelling and Other (See Comments)    Reaction:  Facial swelling  . Food Shortness Of Breath, Rash and Other (See Comments)    Pt states that he is allergic to strawberries.    . Lipitor [Atorvastatin] Shortness Of Breath and Other (See Comments)    Reaction:  Nose bleeds   . Aspirin Other (See Comments)    Reaction:  GI upset   . Banana Diarrhea   . Metolazone Other (See Comments)    Patient states that he stopped taking due to "heart attack" like symptoms  . Orange Juice [Orange Oil] Nausea And Vomiting  . Torsemide Swelling and Other (See Comments)    Reaction:  Swelling of feet/legs   . Diltiazem Palpitations  . Hydralazine Palpitations    Consultations:  none   Procedures/Studies: Dg Chest 2 View  Result Date: 06/20/2016 CLINICAL DATA:  Shortness of breath for 2 days. EXAM: CHEST  2 VIEW COMPARISON:  06/12/2016 and 06/09/2016 FINDINGS: There is chronic cardiomegaly. There is slight pulmonary vascular congestion and slight haziness in both lungs which I suspect represents mild bilateral pulmonary edema. No  discrete effusions. IMPRESSION: Findings of mild congestive heart failure. Electronically Signed   By: Francene Boyers M.D.   On: 06/20/2016 21:53   Dg Chest Portable 1 View  Result Date: 06/22/2016 CLINICAL DATA:  Shortness of breath for 3 days. History of asthma, cardiomyopathy. EXAM: PORTABLE CHEST 1 VIEW COMPARISON:  Chest radiograph June 20, 2016 FINDINGS: The cardiac silhouette is moderate to severely enlarged, unchanged. Mediastinal silhouette is nonsuspicious. Pulmonary vascular congestion and interstitial prominence without pleural effusion or focal consolidation. No pneumothorax. Soft tissue planes and included osseous structures are nonsuspicious. Large body habitus. IMPRESSION: Stable cardiomegaly and mild interstitial edema. Electronically Signed   By: Awilda Metro M.D.   On: 06/22/2016 02:31    =echocardiogram.    Subjective: No new complaints.    Discharge Exam: Vitals:   06/24/16 2159 06/25/16 0558  BP: 92/71 115/70  Pulse: 87 76  Resp: 20 20  Temp: 98.2 F (36.8 C) 97.7 F (36.5 C)   Vitals:   06/24/16 1614 06/24/16 1719 06/24/16 2159 06/25/16 0558  BP: (!) 83/59 114/61 92/71 115/70  Pulse: 66 78 87 76  Resp:   20 20  Temp:   98.2 F (36.8 C) 97.7 F (36.5 C)  TempSrc:   Oral  Oral  SpO2: 97%  100% 97%  Weight:      Height:        General: Pt is alert, awake, not in acute distress Cardiovascular: RRR, S1/S2 +, no rubs, no gallops Respiratory: CTA bilaterally, no wheezing, no rhonchi Abdominal: Soft, NT, ND, bowel sounds + Extremities: no edema, no cyanosis    The results of significant diagnostics from this hospitalization (including imaging, microbiology, ancillary and laboratory) are listed below for reference.     Microbiology: Recent Results (from the past 240 hour(s))  MRSA PCR Screening     Status: None   Collection Time: 06/22/16  6:04 AM  Result Value Ref Range Status   MRSA by PCR NEGATIVE NEGATIVE Final    Comment:        The GeneXpert MRSA Assay (FDA approved for NASAL specimens only), is one component of a comprehensive MRSA colonization surveillance program. It is not intended to diagnose MRSA infection nor to guide or monitor treatment for MRSA infections.      Labs: BNP (last 3 results)  Recent Labs  05/17/16 1814 06/20/16 2108 06/22/16 0242  BNP 455.0* 640.0* 631.0*   Basic Metabolic Panel:  Recent Labs Lab 06/20/16 2107 06/22/16 0242 06/23/16 0554 06/24/16 0555 06/25/16 1020  NA 139 142 139 139 138  K 3.8 3.4* 3.1* 3.5 3.6  CL 109 107 103 104 106  CO2 25 27 29 27 28   GLUCOSE 95 87 95 90 111*  BUN 17 23* 25* 25* 23*  CREATININE 0.95 1.30* 1.22 1.29* 1.31*  CALCIUM 8.6* 8.9 8.3* 8.5* 8.5*   Liver Function Tests:  Recent Labs Lab 06/23/16 0554  AST 20  ALT 16*  ALKPHOS 63  BILITOT 1.7*  PROT 6.1*  ALBUMIN 3.3*   No results for input(s): LIPASE, AMYLASE in the last 168 hours. No results for input(s): AMMONIA in the last 168 hours. CBC:  Recent Labs Lab 06/20/16 2107 06/22/16 0242 06/23/16 0554  WBC 4.6 4.5 3.5*  NEUTROABS  --  3.1  --   HGB 10.0* 10.0* 9.9*  HCT 32.4* 32.1* 32.2*  MCV 81.6 81.9 81.7  PLT 262 234 252   Cardiac Enzymes:  Recent Labs Lab 06/20/16 2107 06/22/16 0242   TROPONINI 0.03*  0.03*   BNP: Invalid input(s): POCBNP CBG: No results for input(s): GLUCAP in the last 168 hours. D-Dimer No results for input(s): DDIMER in the last 72 hours. Hgb A1c No results for input(s): HGBA1C in the last 72 hours. Lipid Profile No results for input(s): CHOL, HDL, LDLCALC, TRIG, CHOLHDL, LDLDIRECT in the last 72 hours. Thyroid function studies No results for input(s): TSH, T4TOTAL, T3FREE, THYROIDAB in the last 72 hours.  Invalid input(s): FREET3 Anemia work up No results for input(s): VITAMINB12, FOLATE, FERRITIN, TIBC, IRON, RETICCTPCT in the last 72 hours. Urinalysis    Component Value Date/Time   COLORURINE YELLOW 05/01/2016 2104   APPEARANCEUR CLEAR 05/01/2016 2104   LABSPEC 1.025 05/01/2016 2104   PHURINE 6.0 05/01/2016 2104   GLUCOSEU NEGATIVE 05/01/2016 2104   GLUCOSEU NEG mg/dL 66/44/0347 4259   HGBUR NEGATIVE 05/01/2016 2104   BILIRUBINUR NEGATIVE 05/01/2016 2104   KETONESUR NEGATIVE 05/01/2016 2104   PROTEINUR 100 (A) 05/01/2016 2104   UROBILINOGEN 0.2 08/08/2014 1445   NITRITE NEGATIVE 05/01/2016 2104   LEUKOCYTESUR NEGATIVE 05/01/2016 2104   Sepsis Labs Invalid input(s): PROCALCITONIN,  WBC,  LACTICIDVEN Microbiology Recent Results (from the past 240 hour(s))  MRSA PCR Screening     Status: None   Collection Time: 06/22/16  6:04 AM  Result Value Ref Range Status   MRSA by PCR NEGATIVE NEGATIVE Final    Comment:        The GeneXpert MRSA Assay (FDA approved for NASAL specimens only), is one component of a comprehensive MRSA colonization surveillance program. It is not intended to diagnose MRSA infection nor to guide or monitor treatment for MRSA infections.      Time coordinating discharge: Over 30 minutes  SIGNED:   Kathlen Mody, MD  [facility] 06/25/2016, 12:14 PM Pager   If 7PM-7AM, please contact night-coverage [email or web address] Password [email or web address]

## 2016-06-25 NOTE — Progress Notes (Signed)
Pt discharged home today per Dr. Akula.  Pt's IV site D/C'd and WDL.  Pt's VSS.  Pt provided with home medication list, discharge instructions and prescriptions.  Verbalized understanding.  Pt left floor via WC in stable condition accompanied by NT. 

## 2016-06-25 NOTE — Care Management Note (Signed)
Case Management Note  Patient Details  Name: [name] MRN: [id number] Date of Birth: Apr 28, 1966  Subjective/Objective:                  Pt is from home, admitted with CHF. Pt lives with mother and is ind with ADL's. Pt is non-compliant diet and medications. Dietitian consult placed this admission. Pt discharged from multiple home care agencies and community resources due to chronic non-compliance. Pt discharging home with self care. today   Action/Plan: No CM needs.   Expected Discharge Date:    06/25/2016              Expected Discharge Plan:  Home/Self Care  In-House Referral:  NA  Discharge planning Services  CM Consult  Post Acute Care Choice:  NA Choice offered to:  NA  Status of Service:  Completed, signed off  [name], RN 06/25/2016, 1:46 PM

## 2016-06-25 NOTE — Care Management Important Message (Signed)
Important Message  Patient Details  Name: [name] MRN: [id number] Date of Birth: 27-May-1966   Medicare Important Message Given:  Yes    [name], RN 06/25/2016, 1:57 PM

## 2016-07-05 ENCOUNTER — Emergency Department (HOSPITAL_COMMUNITY)
Admission: EM | Admit: 2016-07-05 | Discharge: 2016-07-05 | Disposition: A | Discharge status: Home / Self Care | Payer: Medicare Other | Attending: Emergency Medicine | Admitting: Emergency Medicine

## 2016-07-05 ENCOUNTER — Emergency Department (HOSPITAL_COMMUNITY): Payer: Medicare Other

## 2016-07-05 ENCOUNTER — Encounter (HOSPITAL_COMMUNITY): Payer: Self-pay | Admitting: Emergency Medicine

## 2016-07-05 DIAGNOSIS — N182 Chronic kidney disease, stage 2 (mild): Secondary | ICD-10-CM | POA: Diagnosis not present

## 2016-07-05 DIAGNOSIS — Z79899 Other long term (current) drug therapy: Secondary | ICD-10-CM | POA: Insufficient documentation

## 2016-07-05 DIAGNOSIS — I509 Heart failure, unspecified: Secondary | ICD-10-CM

## 2016-07-05 DIAGNOSIS — I130 Hypertensive heart and chronic kidney disease with heart failure and stage 1 through stage 4 chronic kidney disease, or unspecified chronic kidney disease: Secondary | ICD-10-CM | POA: Diagnosis not present

## 2016-07-05 DIAGNOSIS — I5043 Acute on chronic combined systolic (congestive) and diastolic (congestive) heart failure: Secondary | ICD-10-CM | POA: Diagnosis not present

## 2016-07-05 DIAGNOSIS — J45909 Unspecified asthma, uncomplicated: Secondary | ICD-10-CM | POA: Diagnosis not present

## 2016-07-05 DIAGNOSIS — Z87891 Personal history of nicotine dependence: Secondary | ICD-10-CM | POA: Insufficient documentation

## 2016-07-05 DIAGNOSIS — R0602 Shortness of breath: Secondary | ICD-10-CM | POA: Diagnosis present

## 2016-07-05 LAB — CBC WITH DIFFERENTIAL/PLATELET
BASOS ABS: 0 10*3/uL (ref 0.0–0.1)
Basophils Relative: 0 %
Eosinophils Absolute: 0.1 10*3/uL (ref 0.0–0.7)
Eosinophils Relative: 2 %
HCT: 33.3 % — ABNORMAL LOW (ref 39.0–52.0)
Hemoglobin: 10.5 g/dL — ABNORMAL LOW (ref 13.0–17.0)
LYMPHS ABS: 0.9 10*3/uL (ref 0.7–4.0)
LYMPHS PCT: 25 %
MCH: 25.3 pg — ABNORMAL LOW (ref 26.0–34.0)
MCHC: 31.5 g/dL (ref 30.0–36.0)
MCV: 80.2 fL (ref 78.0–100.0)
MONO ABS: 0.4 10*3/uL (ref 0.1–1.0)
Monocytes Relative: 11 %
NEUTROS ABS: 2.2 10*3/uL (ref 1.7–7.7)
Neutrophils Relative %: 62 %
Platelets: 269 10*3/uL (ref 150–400)
RBC: 4.15 MIL/uL — AB (ref 4.22–5.81)
RDW: 16.9 % — AB (ref 11.5–15.5)
WBC: 3.5 10*3/uL — ABNORMAL LOW (ref 4.0–10.5)

## 2016-07-05 LAB — BASIC METABOLIC PANEL
Anion gap: 8 (ref 5–15)
BUN: 19 mg/dL (ref 6–20)
CHLORIDE: 105 mmol/L (ref 101–111)
CO2: 24 mmol/L (ref 22–32)
Calcium: 8.6 mg/dL — ABNORMAL LOW (ref 8.9–10.3)
Creatinine, Ser: 1.13 mg/dL (ref 0.61–1.24)
GFR calc Af Amer: >60 mL/min (ref >60)
GLUCOSE: 88 mg/dL (ref 65–99)
POTASSIUM: 3.7 mmol/L (ref 3.5–5.1)
Sodium: 137 mmol/L (ref 135–145)

## 2016-07-05 LAB — BRAIN NATRIURETIC PEPTIDE: B Natriuretic Peptide: 748 pg/mL — ABNORMAL HIGH (ref 0.0–100.0)

## 2016-07-05 MED ORDER — ALBUTEROL SULFATE (2.5 MG/3ML) 0.083% IN NEBU
5.0000 mg | INHALATION_SOLUTION | Freq: Once | RESPIRATORY_TRACT | Status: AC
  Administered 2016-07-05: 5 mg via RESPIRATORY_TRACT
  Filled 2016-07-05: qty 6

## 2016-07-05 MED ORDER — BUMETANIDE 0.25 MG/ML IJ SOLN
1.0000 mg | Freq: Once | INTRAMUSCULAR | Status: AC
  Administered 2016-07-05: 1 mg via INTRAVENOUS
  Filled 2016-07-05: qty 4

## 2016-07-05 MED ORDER — NITROGLYCERIN 2 % TD OINT
1.0000 [in_us] | TOPICAL_OINTMENT | Freq: Once | TRANSDERMAL | Status: AC
  Administered 2016-07-05: 1 [in_us] via TOPICAL
  Filled 2016-07-05: qty 1

## 2016-07-05 MED ORDER — BUMETANIDE 0.25 MG/ML IJ SOLN
INTRAMUSCULAR | Status: AC
  Filled 2016-07-05: qty 4

## 2016-07-05 NOTE — Discharge Instructions (Signed)
Continue your medications. Recheck as needed.

## 2016-07-05 NOTE — ED Provider Notes (Addendum)
[facility]EM[facility]ENCY DEPT Provider Note   CSN: [id number] Arrival date & time: 07/05/16  0042  Time seen 01:10 AM    History   Chief Complaint Chief Complaint  Patient presents with  . Shortness of Breath    HPI [name] is a 51 y.o. male.  HPI  patient comes to the ED frequently for exacerbation of his chronic congestive heart failure. He states he started getting short of breath about 3 days ago and is coughing up white and clear sputum. He denies chest pain but states his chest feels tight. He states he is having chronic swelling of his lower extremities. He states he's gained about 13 pounds over the past week. He states he is taking both Lasix and Bumex. He states Bumex works better however sometimes it makes him feel confused and affects his hearing.  PCP Pearson Grippe, MD   Past Medical History:  Diagnosis Date  . Allergic rhinitis   . Asthma   . Atrial fibrillation (HCC)   . CHF (congestive heart failure) (HCC)   . Essential hypertension   . Gastroesophageal reflux disease   . Heart attack   . History of cardiac catheterization    Normal coronaries December 2016  . Noncompliance    Major problem leading to declining health and recurrent hospitalization  . Nonischemic cardiomyopathy (HCC)    LVEF 20-25%  . OSA (obstructive sleep apnea)   . Osteoarthritis   . Peptic ulcer disease     Patient Active Problem List   Diagnosis Date Noted  . CKD (chronic kidney disease), stage II 05/17/2016  . Coronary arteries, normal Dec 2016 01/23/2016  . Chronic anticoagulation-Xarelto 01/23/2016  . NSVT (nonsustained ventricular tachycardia) (HCC) 01/23/2016  . Elevated troponin 12/21/2015  . Nonischemic cardiomyopathy (HCC)   . Morbid obesity due to excess calories (HCC)   . Noncompliance with diet and medication regimen 12/02/2015  . Acute on chronic systolic congestive heart failure (HCC)   . Acute on chronic combined systolic and diastolic heart failure (HCC) 10/08/2015  .  OSA (obstructive sleep apnea) 09/20/2015  . Pulmonary hypertension 09/19/2015  . Normocytic anemia 09/19/2015  . Chronic atrial fibrillation (HCC)   . Dyspnea on exertion 05/28/2015  . Acute respiratory failure with hypoxia (HCC) 04/01/2015  . Hypokalemia 04/01/2015  . Obesity hypoventilation syndrome (HCC) 08/08/2014  . GERD (gastroesophageal reflux disease) 08/08/2014  . Atrial fibrillation (HCC) 06/28/2014  . Edema of right lower extremity 06/21/2014  . Fecal urgency 06/21/2014  . Asthma 02/11/2009  . Hyperlipidemia 07/12/2008  . OBESITY, MORBID 07/12/2008  . Anxiety state 07/12/2008  . DEPRESSION 07/12/2008  . Essential hypertension 07/12/2008  . ALLERGIC RHINITIS 07/12/2008  . Peptic ulcer 07/12/2008  . OSTEOARTHRITIS 07/12/2008    Past Surgical History:  Procedure Laterality Date  . CARDIAC CATHETERIZATION N/A 06/14/2015   Procedure: Right/Left Heart Cath and Coronary Angiography;  Surgeon: Runell Gess, MD;  Location: [facility] INVASIVE CV LAB;  Service: Cardiovascular;  Laterality: N/A;       Home Medications    Prior to Admission medications   Medication Sig Start Date End Date Taking? Authorizing Provider  atorvastatin (LIPITOR) 40 MG tablet Take 1 tablet (40 mg total) by mouth daily at 6 PM. 06/25/16   Kathlen Mody, MD  beclomethasone (QVAR) 80 MCG/ACT inhaler Inhale 2 puffs into the lungs 2 (two) times daily as needed (for shortness of breath).    Historical Provider, MD  bumetanide (BUMEX) 2 MG tablet Take 1 tablet (2 mg total) by  mouth 2 (two) times daily. 05/22/16   Erick Blinks, MD  carvedilol (COREG) 25 MG tablet Take 1 tablet (25 mg total) by mouth 2 (two) times daily with a meal. 06/25/16   Kathlen Mody, MD  docusate sodium (COLACE) 100 MG capsule Take 1 capsule (100 mg total) by mouth 2 (two) times daily. 06/25/16   Kathlen Mody, MD  ferrous gluconate (FERGON) 324 MG tablet Take 1 tablet (324 mg total) by mouth 2 (two) times daily with a meal. 06/25/16   Kathlen Mody, MD  furosemide (LASIX) 40 MG tablet Take 1 tablet (40 mg total) by mouth 2 (two) times daily. 06/25/16   Kathlen Mody, MD  lisinopril (PRINIVIL,ZESTRIL) 5 MG tablet Take 1 tablet (5 mg total) by mouth daily. 06/25/16   Kathlen Mody, MD  pantoprazole (PROTONIX) 40 MG tablet Take 1 tablet (40 mg total) by mouth daily at 6 (six) AM. 06/26/16   Kathlen Mody, MD  potassium chloride SA (K-DUR,KLOR-CON) 20 MEQ tablet Take 2 tablets (40 mEq total) by mouth daily. 06/25/16   Kathlen Mody, MD  rivaroxaban (XARELTO) 20 MG TABS tablet Take 1 tablet (20 mg total) by mouth daily with breakfast. 06/26/16   Kathlen Mody, MD  spironolactone (ALDACTONE) 50 MG tablet Take 1 tablet (50 mg total) by mouth daily. 06/25/16   Kathlen Mody, MD  tamsulosin (FLOMAX) 0.4 MG CAPS capsule Take 1 capsule (0.4 mg total) by mouth daily. 06/25/16   Kathlen Mody, MD    Family History Family History  Problem Relation Age of Onset  . Stroke Father   . Heart attack Father   . Aneurysm Mother     Cerebral aneurysm  . Hypertension Sister   . Colon cancer Neg Hx   . Inflammatory bowel disease Neg Hx   . Liver disease Neg Hx     Social History Social History  Substance Use Topics  . Smoking status: Former Smoker    Packs/day: 0.50    Years: 20.00    Types: Cigarettes    Start date: 04/26/1988    Quit date: 06/23/2007  . Smokeless tobacco: Never Used     Comment: 1 ppd former smoker  . Alcohol use No     Comment: No etoh since 2009     Allergies   Bee venom; Food; Lipitor [atorvastatin]; Aspirin; Banana; Metolazone; Orange juice [orange oil]; Torsemide; Diltiazem; and Hydralazine   Review of Systems Review of Systems  All other systems reviewed and are negative.    Physical Exam Updated Vital Signs BP (!) 149/109 (BP Location: Left Arm)   Pulse 110   Temp 99.2 F (37.3 C) (Oral)   Resp 19   Ht 6' (1.829 m)   Wt 300 lb (136.1 kg)   SpO2 97%   BMI 40.69 kg/m   Vital signs normal except for  tachycardia   Physical Exam  Constitutional: He is oriented to person, place, and time. He appears well-developed and well-nourished.  Non-toxic appearance. He does not appear ill. No distress.  Obese, but patient has lost weight in his upper body  HENT:  Head: Normocephalic and atraumatic.  Right Ear: External ear normal.  Left Ear: External ear normal.  Nose: Nose normal. No mucosal edema or rhinorrhea.  Mouth/Throat: Oropharynx is clear and moist and mucous membranes are normal. No dental abscesses or uvula swelling.  Eyes: Conjunctivae and EOM are normal. Pupils are equal, round, and reactive to light.  Neck: Normal range of motion and full passive range of motion  without pain. Neck supple.  Cardiovascular: Normal rate, regular rhythm and normal heart sounds.  Exam reveals no gallop and no friction rub.   No murmur heard. Pulmonary/Chest: Effort normal. No respiratory distress. He has decreased breath sounds. He has no wheezes. He has no rhonchi. He has no rales. He exhibits no tenderness and no crepitus.  Abdominal: Soft. Normal appearance and bowel sounds are normal. He exhibits distension. There is no tenderness. There is no rebound and no guarding.  Musculoskeletal: Normal range of motion. He exhibits edema. He exhibits no tenderness.  Moves all extremities well. Patient has marked swelling of both lower extremities.  Neurological: He is alert and oriented to person, place, and time. He has normal strength. No cranial nerve deficit.  Skin: Skin is warm, dry and intact. No rash noted. No erythema. No pallor.  Psychiatric: He has a normal mood and affect. His speech is normal and behavior is normal. His mood appears not anxious.  Nursing note and vitals reviewed.    ED Treatments / Results  Labs (all labs ordered are listed, but only abnormal results are displayed) Results for orders placed or performed during the hospital encounter of 07/05/16  Basic metabolic panel  Result  Value Ref Range   Sodium 137 135 - 145 mmol/L   Potassium 3.7 3.5 - 5.1 mmol/L   Chloride 105 101 - 111 mmol/L   CO2 24 22 - 32 mmol/L   Glucose, Bld 88 65 - 99 mg/dL   BUN 19 6 - 20 mg/dL   Creatinine, Ser 3.95 0.61 - 1.24 mg/dL   Calcium 8.6 (L) 8.9 - 10.3 mg/dL   GFR calc non Af Amer >60 >60 mL/min   GFR calc Af Amer >60 >60 mL/min   Anion gap 8 5 - 15  CBC with Differential  Result Value Ref Range   WBC 3.5 (L) 4.0 - 10.5 K/uL   RBC 4.15 (L) 4.22 - 5.81 MIL/uL   Hemoglobin 10.5 (L) 13.0 - 17.0 g/dL   HCT 32.0 (L) 23.3 - 43.5 %   MCV 80.2 78.0 - 100.0 fL   MCH 25.3 (L) 26.0 - 34.0 pg   MCHC 31.5 30.0 - 36.0 g/dL   RDW 68.6 (H) 16.8 - 37.2 %   Platelets 269 150 - 400 K/uL   Neutrophils Relative % 62 %   Neutro Abs 2.2 1.7 - 7.7 K/uL   Lymphocytes Relative 25 %   Lymphs Abs 0.9 0.7 - 4.0 K/uL   Monocytes Relative 11 %   Monocytes Absolute 0.4 0.1 - 1.0 K/uL   Eosinophils Relative 2 %   Eosinophils Absolute 0.1 0.0 - 0.7 K/uL   Basophils Relative 0 %   Basophils Absolute 0.0 0.0 - 0.1 K/uL  Brain natriuretic peptide  Result Value Ref Range   B Natriuretic Peptide 748.0 (H) 0.0 - 100.0 pg/mL    Laboratory interpretation all normal except Mild anemia, stable elevation of BNP   EKG  EKG Interpretation  Date/Time:  Sunday July 05 2016 00:51:31 EST Ventricular Rate:  115 PR Interval:    QRS Duration: 87 QT Interval:  364 QTC Calculation: 504 R Axis:   67 Text Interpretation:  Atrial fibrillation Nonspecific T abnormalities, lateral leads Prolonged QT interval No significant change since last tracing 22 Jun 2016 Confirmed by [facility]  MD-I, Dayshia Ballinas ([id number]) on 07/05/2016 12:53:42 AM       Radiology Dg Chest 2 View  Result Date: 07/05/2016 CLINICAL DATA:  Shortness of breath and swelling in  the legs. History of CHF on Lasix. EXAM: CHEST  2 VIEW COMPARISON:  06/22/2016 FINDINGS: Cardiac enlargement. Mild fluid in the fissures. No pulmonary vascular congestion. No  significant edema. No focal airspace disease or consolidation in the lungs. No blunting of costophrenic angles. No pneumothorax. Mild degenerative changes in the spine. IMPRESSION: Cardiac enlargement with fluid in the fissures. No evidence of significant vascular congestion. Electronically Signed   By: Burman Nieves M.D.   On: 07/05/2016 01:50    Procedures Procedures (including critical care time)  Medications Ordered in ED Medications  albuterol (PROVENTIL) (2.5 MG/3ML) 0.083% nebulizer solution 5 mg (5 mg Nebulization Given 07/05/16 0058)  bumetanide (BUMEX) injection 1 mg (1 mg Intravenous Given 07/05/16 0148)  nitroGLYCERIN (NITROGLYN) 2 % ointment 1 inch (1 inch Topical Given 07/05/16 0148)     Initial Impression / Assessment and Plan / ED Course  I have reviewed the triage vital signs and the nursing notes.  Pertinent labs & imaging results that were available during my care of the patient were reviewed by me and considered in my medical decision making (see chart for details).  Clinical Course    Patient was given albuterol nebulizer by nursing staff. He received Bumex IV and nitroglycerin topically.  Recheck at 03:00 am patient has filled a urinal with urine. He states he is feeling better, but wants to stay in the ED a little longer.   05:30 AM patient has filled another urinal with urine.  Patient is a frequent ED visitor he comes every couple weeks to get diuresed in the ED and he generally goes home.  Final Clinical Impressions(s) / ED Diagnoses   Final diagnoses:  Acute on chronic congestive heart failure, unspecified congestive heart failure type [facility])    Plan discharge  Devoria Albe, MD, Concha Pyo, MD 07/05/16 [id number]    Devoria Albe, MD 07/05/16 [phone]

## 2016-07-05 NOTE — ED Triage Notes (Signed)
Pt states he has been having increase SOB and swelling in legs.  Has hx of CHF and is on lasix but states "I don't think it's helping"

## 2016-07-07 ENCOUNTER — Encounter: Payer: Medicare Other | Admitting: Adult Health

## 2016-07-09 ENCOUNTER — Encounter: Payer: Medicare Other | Admitting: Cardiovascular Disease

## 2016-07-13 ENCOUNTER — Ambulatory Visit (INDEPENDENT_AMBULATORY_CARE_PROVIDER_SITE_OTHER): Payer: Medicare Other | Admitting: Cardiovascular Disease

## 2016-07-13 ENCOUNTER — Encounter: Payer: Self-pay | Admitting: Cardiovascular Disease

## 2016-07-13 VITALS — BP 162/100 | HR 95 | Ht 72.0 in | Wt 305.8 lb

## 2016-07-13 DIAGNOSIS — I482 Chronic atrial fibrillation, unspecified: Secondary | ICD-10-CM

## 2016-07-13 DIAGNOSIS — Z9111 Patient's noncompliance with dietary regimen: Secondary | ICD-10-CM

## 2016-07-13 DIAGNOSIS — Z9289 Personal history of other medical treatment: Secondary | ICD-10-CM | POA: Diagnosis not present

## 2016-07-13 DIAGNOSIS — Z9114 Patient's other noncompliance with medication regimen: Secondary | ICD-10-CM

## 2016-07-13 DIAGNOSIS — I10 Essential (primary) hypertension: Secondary | ICD-10-CM

## 2016-07-13 DIAGNOSIS — Z91119 Patient's noncompliance with dietary regimen due to unspecified reason: Secondary | ICD-10-CM

## 2016-07-13 DIAGNOSIS — I5189 Other ill-defined heart diseases: Secondary | ICD-10-CM

## 2016-07-13 DIAGNOSIS — I519 Heart disease, unspecified: Secondary | ICD-10-CM | POA: Diagnosis not present

## 2016-07-13 DIAGNOSIS — Z91199 Patient's other noncompliance with medication regimen for other reason: Secondary | ICD-10-CM

## 2016-07-13 DIAGNOSIS — I5043 Acute on chronic combined systolic (congestive) and diastolic (congestive) heart failure: Secondary | ICD-10-CM | POA: Diagnosis not present

## 2016-07-13 NOTE — Patient Instructions (Signed)
Medication Instructions:  Continue all current medications.  Labwork: None  Testing/Procedures: none  Follow-Up: 3 weeks   Any Other Special Instructions Will Be Listed Below (If Applicable).  If you need a refill on your cardiac medications before your next appointment, please call your pharmacy.

## 2016-07-13 NOTE — Progress Notes (Signed)
SUBJECTIVE: The patient presents for posthospitalization follow-up. He was discharged on 06/25/16 for acute on chronic combined systolic and diastolic heart failure. He also has chronic atrial fibrillation.  He is notoriously noncompliant with medications and dietary restrictions.  He has been taking carvedilol once daily and Bumex primarily once daily and occasionally twice daily. Weight today is 305 pounds, 293 pounds on 06/25/15.     Review of Systems: As per "subjective", otherwise negative.  Allergies  Allergen Reactions  . Bee Venom Shortness Of Breath, Swelling and Other (See Comments)    Reaction:  Facial swelling  . Food Shortness Of Breath, Rash and Other (See Comments)    Pt states that he is allergic to strawberries.    . Lipitor [Atorvastatin] Shortness Of Breath and Other (See Comments)    Reaction:  Nose bleeds   . Aspirin Other (See Comments)    Reaction:  GI upset   . Banana Diarrhea  . Metolazone Other (See Comments)    Patient states that he stopped taking due to "heart attack" like symptoms  . Orange Juice [Orange Oil] Nausea And Vomiting  . Torsemide Swelling and Other (See Comments)    Reaction:  Swelling of feet/legs   . Diltiazem Palpitations  . Hydralazine Palpitations    Current Outpatient Prescriptions  Medication Sig Dispense Refill  . atorvastatin (LIPITOR) 40 MG tablet Take 1 tablet (40 mg total) by mouth daily at 6 PM. 30 tablet 0  . beclomethasone (QVAR) 80 MCG/ACT inhaler Inhale 2 puffs into the lungs 2 (two) times daily as needed (for shortness of breath).    . bumetanide (BUMEX) 2 MG tablet Take 1 tablet (2 mg total) by mouth 2 (two) times daily. 60 tablet 1  . carvedilol (COREG) 25 MG tablet Take 1 tablet (25 mg total) by mouth 2 (two) times daily with a meal. 60 tablet 1  . docusate sodium (COLACE) 100 MG capsule Take 1 capsule (100 mg total) by mouth 2 (two) times daily. 10 capsule 0  . ferrous gluconate (FERGON) 324 MG tablet Take 1  tablet (324 mg total) by mouth 2 (two) times daily with a meal. 60 tablet 0  . furosemide (LASIX) 40 MG tablet Take 1 tablet (40 mg total) by mouth 2 (two) times daily. 60 tablet 0  . lisinopril (PRINIVIL,ZESTRIL) 5 MG tablet Take 1 tablet (5 mg total) by mouth daily. 30 tablet 0  . pantoprazole (PROTONIX) 40 MG tablet Take 1 tablet (40 mg total) by mouth daily at 6 (six) AM. 30 tablet 0  . potassium chloride SA (K-DUR,KLOR-CON) 20 MEQ tablet Take 2 tablets (40 mEq total) by mouth daily. 30 tablet 0  . rivaroxaban (XARELTO) 20 MG TABS tablet Take 1 tablet (20 mg total) by mouth daily with breakfast. 30 tablet 0  . spironolactone (ALDACTONE) 50 MG tablet Take 1 tablet (50 mg total) by mouth daily. 30 tablet 0  . tamsulosin (FLOMAX) 0.4 MG CAPS capsule Take 1 capsule (0.4 mg total) by mouth daily. 30 capsule 0   No current facility-administered medications for this visit.     Past Medical History:  Diagnosis Date  . Allergic rhinitis   . Asthma   . Atrial fibrillation (HCC)   . CHF (congestive heart failure) (HCC)   . Essential hypertension   . Gastroesophageal reflux disease   . Heart attack   . History of cardiac catheterization    Normal coronaries December 2016  . Noncompliance    Major problem  leading to declining health and recurrent hospitalization  . Nonischemic cardiomyopathy (HCC)    LVEF 20-25%  . OSA (obstructive sleep apnea)   . Osteoarthritis   . Peptic ulcer disease     Past Surgical History:  Procedure Laterality Date  . CARDIAC CATHETERIZATION N/A 06/14/2015   Procedure: Right/Left Heart Cath and Coronary Angiography;  Surgeon: Runell Gess, MD;  Location: [facility] INVASIVE CV LAB;  Service: Cardiovascular;  Laterality: N/A;    Social History   Social History  . Marital status: Divorced    Spouse name: N/A  . Number of children: 3  . Years of education: N/A   Occupational History  . disabled    Social History Main Topics  . Smoking status: Former Smoker      Packs/day: 0.50    Years: 20.00    Types: Cigarettes    Start date: 04/26/1988    Quit date: 06/23/2007  . Smokeless tobacco: Never Used     Comment: 1 ppd former smoker  . Alcohol use No     Comment: No etoh since 2009  . Drug use: No  . Sexual activity: No   Other Topics Concern  . Not on file   Social History Narrative  . No narrative on file     Vitals:   07/13/16 1617  BP: (!) 162/100  Pulse: 95  SpO2: 97%  Weight: (!) 305 lb 12.8 oz (138.7 kg)  Height: 6' (1.829 m)    PHYSICAL EXAM General: NAD, wearing sunglasses. HEENT: Normal. Neck: No JVD, no thyromegaly. Lungs: Clear to auscultation bilaterally. CV: Nondisplaced PMI.  Regular rate, irregular rhythm, normal S1/S2, no S3, no murmur. 1+ tense pretibial edema b/l with chronic venous stasis dermatitis. Abdomen: Obese.  Neurologic: Alert and oriented.  Psych: Normal affect. Musculoskeletal: No gross deformities.     ECG: Most recent ECG reviewed.      ASSESSMENT AND PLAN: 1. Acute on chronic combined systolic and diastolic heart failure: Encouraged to take carvedilol twice daily and Bumex twice daily as prescribed.  2. Nonischemic cardiomyopathy, LVEF 20-25%: Normal coronary arteries in 05/2015.  3. Persistent atrial fibrillation: Prescribed Xarelto. Unclear that he takes it. Encouraged to take Coreg as prescribed to control heart rate.  4. Essential HTN: Markedly elevated. Noncompliant with Rx. Encouraged to take Coreg as prescribed.  5. Medication/treatment noncompliance: Repeatedly educated regarding health hazards which did not change his habits.  Dispo: fu 3 wks in [place] office with PA   Prentice Docker, M.D., F.A.C.C.

## 2016-07-19 ENCOUNTER — Inpatient Hospital Stay (HOSPITAL_COMMUNITY)
Admission: EM | Admit: 2016-07-19 | Discharge: 2016-07-25 | DRG: 291 | Disposition: A | Discharge status: Home / Self Care | Payer: Medicare Other | Attending: Internal Medicine | Admitting: Internal Medicine

## 2016-07-19 ENCOUNTER — Encounter (HOSPITAL_COMMUNITY): Payer: Self-pay | Admitting: Emergency Medicine

## 2016-07-19 ENCOUNTER — Emergency Department (HOSPITAL_COMMUNITY): Payer: Medicare Other

## 2016-07-19 DIAGNOSIS — I4891 Unspecified atrial fibrillation: Secondary | ICD-10-CM | POA: Diagnosis present

## 2016-07-19 DIAGNOSIS — I272 Pulmonary hypertension, unspecified: Secondary | ICD-10-CM | POA: Diagnosis present

## 2016-07-19 DIAGNOSIS — Z8249 Family history of ischemic heart disease and other diseases of the circulatory system: Secondary | ICD-10-CM | POA: Diagnosis not present

## 2016-07-19 DIAGNOSIS — Z886 Allergy status to analgesic agent status: Secondary | ICD-10-CM | POA: Diagnosis not present

## 2016-07-19 DIAGNOSIS — R0602 Shortness of breath: Secondary | ICD-10-CM | POA: Diagnosis not present

## 2016-07-19 DIAGNOSIS — Z6841 Body Mass Index (BMI) 40.0 and over, adult: Secondary | ICD-10-CM

## 2016-07-19 DIAGNOSIS — E784 Other hyperlipidemia: Secondary | ICD-10-CM | POA: Diagnosis not present

## 2016-07-19 DIAGNOSIS — Z87891 Personal history of nicotine dependence: Secondary | ICD-10-CM

## 2016-07-19 DIAGNOSIS — Z9111 Patient's noncompliance with dietary regimen: Secondary | ICD-10-CM | POA: Diagnosis not present

## 2016-07-19 DIAGNOSIS — M7989 Other specified soft tissue disorders: Secondary | ICD-10-CM | POA: Diagnosis present

## 2016-07-19 DIAGNOSIS — I5043 Acute on chronic combined systolic (congestive) and diastolic (congestive) heart failure: Secondary | ICD-10-CM | POA: Diagnosis present

## 2016-07-19 DIAGNOSIS — I480 Paroxysmal atrial fibrillation: Secondary | ICD-10-CM | POA: Diagnosis present

## 2016-07-19 DIAGNOSIS — Z888 Allergy status to other drugs, medicaments and biological substances status: Secondary | ICD-10-CM | POA: Diagnosis not present

## 2016-07-19 DIAGNOSIS — Z7901 Long term (current) use of anticoagulants: Secondary | ICD-10-CM | POA: Diagnosis not present

## 2016-07-19 DIAGNOSIS — Z9119 Patient's noncompliance with other medical treatment and regimen: Secondary | ICD-10-CM

## 2016-07-19 DIAGNOSIS — Z9114 Patient's other noncompliance with medication regimen: Secondary | ICD-10-CM | POA: Diagnosis not present

## 2016-07-19 DIAGNOSIS — Z91030 Bee allergy status: Secondary | ICD-10-CM

## 2016-07-19 DIAGNOSIS — Z823 Family history of stroke: Secondary | ICD-10-CM

## 2016-07-19 DIAGNOSIS — N182 Chronic kidney disease, stage 2 (mild): Secondary | ICD-10-CM | POA: Diagnosis present

## 2016-07-19 DIAGNOSIS — M1990 Unspecified osteoarthritis, unspecified site: Secondary | ICD-10-CM | POA: Diagnosis present

## 2016-07-19 DIAGNOSIS — Z91018 Allergy to other foods: Secondary | ICD-10-CM

## 2016-07-19 DIAGNOSIS — E785 Hyperlipidemia, unspecified: Secondary | ICD-10-CM | POA: Diagnosis present

## 2016-07-19 DIAGNOSIS — Z79899 Other long term (current) drug therapy: Secondary | ICD-10-CM

## 2016-07-19 DIAGNOSIS — Z8711 Personal history of peptic ulcer disease: Secondary | ICD-10-CM

## 2016-07-19 DIAGNOSIS — K219 Gastro-esophageal reflux disease without esophagitis: Secondary | ICD-10-CM | POA: Diagnosis present

## 2016-07-19 DIAGNOSIS — E662 Morbid (severe) obesity with alveolar hypoventilation: Secondary | ICD-10-CM | POA: Diagnosis present

## 2016-07-19 DIAGNOSIS — I481 Persistent atrial fibrillation: Secondary | ICD-10-CM | POA: Diagnosis not present

## 2016-07-19 DIAGNOSIS — I130 Hypertensive heart and chronic kidney disease with heart failure and stage 1 through stage 4 chronic kidney disease, or unspecified chronic kidney disease: Principal | ICD-10-CM | POA: Diagnosis present

## 2016-07-19 DIAGNOSIS — I429 Cardiomyopathy, unspecified: Secondary | ICD-10-CM | POA: Diagnosis present

## 2016-07-19 DIAGNOSIS — I252 Old myocardial infarction: Secondary | ICD-10-CM

## 2016-07-19 DIAGNOSIS — I482 Chronic atrial fibrillation: Secondary | ICD-10-CM | POA: Diagnosis not present

## 2016-07-19 DIAGNOSIS — J449 Chronic obstructive pulmonary disease, unspecified: Secondary | ICD-10-CM | POA: Diagnosis present

## 2016-07-19 DIAGNOSIS — I509 Heart failure, unspecified: Secondary | ICD-10-CM | POA: Insufficient documentation

## 2016-07-19 DIAGNOSIS — I10 Essential (primary) hypertension: Secondary | ICD-10-CM | POA: Diagnosis present

## 2016-07-19 LAB — BLOOD GAS, ARTERIAL
Acid-Base Excess: 0 mmol/L (ref 0.0–2.0)
Bicarbonate: 24.5 mmol/L (ref 20.0–28.0)
DRAWN BY: 23534
FIO2: 0.21
O2 CONTENT: 21 L/min
O2 SAT: 91.4 %
PO2 ART: 67.5 mmHg — AB (ref 83.0–108.0)
pCO2 arterial: 36.8 mmHg (ref 32.0–48.0)
pH, Arterial: 7.427 (ref 7.350–7.450)

## 2016-07-19 LAB — BASIC METABOLIC PANEL
Anion gap: 6 (ref 5–15)
BUN: 18 mg/dL (ref 6–20)
CALCIUM: 8.7 mg/dL — AB (ref 8.9–10.3)
CHLORIDE: 110 mmol/L (ref 101–111)
CO2: 24 mmol/L (ref 22–32)
CREATININE: 1 mg/dL (ref 0.61–1.24)
GFR calc Af Amer: >60 mL/min (ref >60)
GFR calc non Af Amer: >60 mL/min (ref >60)
GLUCOSE: 91 mg/dL (ref 65–99)
Potassium: 3.5 mmol/L (ref 3.5–5.1)
Sodium: 140 mmol/L (ref 135–145)

## 2016-07-19 LAB — CBC
HCT: 32.8 % — ABNORMAL LOW (ref 39.0–52.0)
HEMOGLOBIN: 10.2 g/dL — AB (ref 13.0–17.0)
MCH: 24.8 pg — ABNORMAL LOW (ref 26.0–34.0)
MCHC: 31.1 g/dL (ref 30.0–36.0)
MCV: 79.8 fL (ref 78.0–100.0)
PLATELETS: 221 10*3/uL (ref 150–400)
RBC: 4.11 MIL/uL — AB (ref 4.22–5.81)
RDW: 16.8 % — ABNORMAL HIGH (ref 11.5–15.5)
WBC: 4.3 10*3/uL (ref 4.0–10.5)

## 2016-07-19 LAB — BRAIN NATRIURETIC PEPTIDE: B Natriuretic Peptide: 808 pg/mL — ABNORMAL HIGH (ref 0.0–100.0)

## 2016-07-19 LAB — TROPONIN I: TROPONIN I: 0.03 ng/mL — AB (ref <0.03)

## 2016-07-19 MED ORDER — SODIUM CHLORIDE 0.9 % IV SOLN: 250.0000 mL | INTRAVENOUS | Status: DC | PRN

## 2016-07-19 MED ORDER — FERROUS GLUCONATE 324 (38 FE) MG PO TABS
324.0000 mg | ORAL_TABLET | Freq: Two times a day (BID) | ORAL | Status: DC
  Administered 2016-07-20 – 2016-07-25 (×10): 324 mg via ORAL
  Filled 2016-07-19 (×16): qty 1

## 2016-07-19 MED ORDER — DOCUSATE SODIUM 100 MG PO CAPS
100.0000 mg | ORAL_CAPSULE | Freq: Two times a day (BID) | ORAL | Status: DC
  Administered 2016-07-19 – 2016-07-25 (×10): 100 mg via ORAL
  Filled 2016-07-19 (×11): qty 1

## 2016-07-19 MED ORDER — FUROSEMIDE 10 MG/ML IJ SOLN
80.0000 mg | Freq: Once | INTRAMUSCULAR | Status: AC
  Administered 2016-07-19: 80 mg via INTRAVENOUS
  Filled 2016-07-19: qty 8

## 2016-07-19 MED ORDER — ATORVASTATIN CALCIUM 40 MG PO TABS
40.0000 mg | ORAL_TABLET | Freq: Every day | ORAL | Status: DC
  Administered 2016-07-20 – 2016-07-24 (×5): 40 mg via ORAL
  Filled 2016-07-19 (×5): qty 1

## 2016-07-19 MED ORDER — SODIUM CHLORIDE 0.9% FLUSH
3.0000 mL | INTRAVENOUS | Status: DC | PRN
  Administered 2016-07-20: 3 mL via INTRAVENOUS
  Filled 2016-07-19: qty 3

## 2016-07-19 MED ORDER — SPIRONOLACTONE 25 MG PO TABS
50.0000 mg | ORAL_TABLET | Freq: Every day | ORAL | Status: DC
  Administered 2016-07-19 – 2016-07-25 (×7): 50 mg via ORAL
  Filled 2016-07-19 (×7): qty 2

## 2016-07-19 MED ORDER — POTASSIUM CHLORIDE CRYS ER 20 MEQ PO TBCR
40.0000 meq | EXTENDED_RELEASE_TABLET | Freq: Every day | ORAL | Status: DC
  Administered 2016-07-19 – 2016-07-25 (×7): 40 meq via ORAL
  Filled 2016-07-19 (×7): qty 2

## 2016-07-19 MED ORDER — LISINOPRIL 5 MG PO TABS
5.0000 mg | ORAL_TABLET | Freq: Every day | ORAL | Status: DC
Start: 2016-07-19 — End: 2016-07-25
  Administered 2016-07-19 – 2016-07-25 (×7): 5 mg via ORAL
  Filled 2016-07-19 (×7): qty 1

## 2016-07-19 MED ORDER — BUDESONIDE 0.25 MG/2ML IN SUSP
0.2500 mg | Freq: Two times a day (BID) | RESPIRATORY_TRACT | Status: DC
  Administered 2016-07-19 – 2016-07-25 (×12): 0.25 mg via RESPIRATORY_TRACT
  Filled 2016-07-19 (×12): qty 2

## 2016-07-19 MED ORDER — FUROSEMIDE 10 MG/ML IJ SOLN
60.0000 mg | Freq: Two times a day (BID) | INTRAMUSCULAR | Status: DC
  Administered 2016-07-19 – 2016-07-22 (×6): 60 mg via INTRAVENOUS
  Filled 2016-07-19 (×6): qty 6

## 2016-07-19 MED ORDER — PANTOPRAZOLE SODIUM 40 MG PO TBEC
40.0000 mg | DELAYED_RELEASE_TABLET | Freq: Every day | ORAL | Status: DC
  Administered 2016-07-20 – 2016-07-25 (×6): 40 mg via ORAL
  Filled 2016-07-19 (×6): qty 1

## 2016-07-19 MED ORDER — SODIUM CHLORIDE 0.9% FLUSH
3.0000 mL | Freq: Two times a day (BID) | INTRAVENOUS | Status: DC
  Administered 2016-07-19 – 2016-07-24 (×9): 3 mL via INTRAVENOUS

## 2016-07-19 MED ORDER — RIVAROXABAN 20 MG PO TABS
20.0000 mg | ORAL_TABLET | Freq: Every day | ORAL | Status: DC
  Administered 2016-07-20 – 2016-07-25 (×6): 20 mg via ORAL
  Filled 2016-07-19 (×6): qty 1

## 2016-07-19 MED ORDER — ACETAMINOPHEN 325 MG PO TABS
650.0000 mg | ORAL_TABLET | ORAL | Status: DC | PRN
  Administered 2016-07-23 – 2016-07-25 (×2): 650 mg via ORAL
  Filled 2016-07-19 (×2): qty 2

## 2016-07-19 MED ORDER — ONDANSETRON HCL 4 MG/2ML IJ SOLN
4.0000 mg | Freq: Four times a day (QID) | INTRAMUSCULAR | Status: DC | PRN
Start: 2016-07-19 — End: 2016-07-25

## 2016-07-19 MED ORDER — TAMSULOSIN HCL 0.4 MG PO CAPS
0.4000 mg | ORAL_CAPSULE | Freq: Every day | ORAL | Status: DC
  Administered 2016-07-19 – 2016-07-25 (×7): 0.4 mg via ORAL
  Filled 2016-07-19 (×7): qty 1

## 2016-07-19 MED ORDER — CARVEDILOL 12.5 MG PO TABS
25.0000 mg | ORAL_TABLET | Freq: Two times a day (BID) | ORAL | Status: DC
  Administered 2016-07-19 – 2016-07-25 (×12): 25 mg via ORAL
  Filled 2016-07-19 (×12): qty 2

## 2016-07-19 NOTE — ED Provider Notes (Signed)
[facility]Y [facility] Provider Note   CSN: [id number] Arrival date & time: 07/19/16  0745  By signing my name below, I, Sonum Patel, attest that this documentation has been prepared under the direction and in the presence of Marily Memos, MD. Electronically Signed: Sonum Patel, Neurosurgeon. 07/19/16. 8:56 AM.  History   Chief Complaint Chief Complaint  Patient presents with  . Shortness of Breath   The history is provided by the patient. No language interpreter was used.     HPI Comments: [name] is a 51 y.o. male with past medical history of CHF, A-fib, asthma who presents to the Emergency Department complaining of several days of worsened SOB with associated worsening BLE swelling. He was seen by his cardiologist 2 days and was told he had a 12 lb weight gain. He is prescribed Bumex and Lasix which he is taking but is concerned these are not working well for him.   Past Medical History:  Diagnosis Date  . Allergic rhinitis   . Asthma   . Atrial fibrillation (HCC)   . CHF (congestive heart failure) (HCC)   . Essential hypertension   . Gastroesophageal reflux disease   . Heart attack   . History of cardiac catheterization    Normal coronaries December 2016  . Noncompliance    Major problem leading to declining health and recurrent hospitalization  . Nonischemic cardiomyopathy (HCC)    LVEF 20-25%  . OSA (obstructive sleep apnea)   . Osteoarthritis   . Peptic ulcer disease     Patient Active Problem List   Diagnosis Date Noted  . Acute on chronic congestive heart failure (HCC) 07/19/2016  . CKD (chronic kidney disease), stage II 05/17/2016  . Coronary arteries, normal Dec 2016 01/23/2016  . Chronic anticoagulation-Xarelto 01/23/2016  . NSVT (nonsustained ventricular tachycardia) (HCC) 01/23/2016  . Elevated troponin 12/21/2015  . Nonischemic cardiomyopathy (HCC)   . Morbid obesity due to excess calories (HCC)   . Noncompliance with diet and medication regimen 12/02/2015   . Acute on chronic systolic congestive heart failure (HCC)   . Acute on chronic combined systolic and diastolic heart failure (HCC) 10/08/2015  . OSA (obstructive sleep apnea) 09/20/2015  . Pulmonary hypertension 09/19/2015  . Normocytic anemia 09/19/2015  . Chronic atrial fibrillation (HCC)   . Dyspnea on exertion 05/28/2015  . Acute respiratory failure with hypoxia (HCC) 04/01/2015  . Hypokalemia 04/01/2015  . Obesity hypoventilation syndrome (HCC) 08/08/2014  . GERD (gastroesophageal reflux disease) 08/08/2014  . Atrial fibrillation (HCC) 06/28/2014  . Edema of right lower extremity 06/21/2014  . Fecal urgency 06/21/2014  . Asthma 02/11/2009  . Hyperlipidemia 07/12/2008  . OBESITY, MORBID 07/12/2008  . Anxiety state 07/12/2008  . DEPRESSION 07/12/2008  . Essential hypertension 07/12/2008  . ALLERGIC RHINITIS 07/12/2008  . Peptic ulcer 07/12/2008  . OSTEOARTHRITIS 07/12/2008    Past Surgical History:  Procedure Laterality Date  . CARDIAC CATHETERIZATION N/A 06/14/2015   Procedure: Right/Left Heart Cath and Coronary Angiography;  Surgeon: Runell Gess, MD;  Location: [facility] INVASIVE CV LAB;  Service: Cardiovascular;  Laterality: N/A;       Home Medications    Prior to Admission medications   Medication Sig Start Date End Date Taking? Authorizing Provider  atorvastatin (LIPITOR) 40 MG tablet Take 1 tablet (40 mg total) by mouth daily at 6 PM. 06/25/16  Yes Kathlen Mody, MD  beclomethasone (QVAR) 80 MCG/ACT inhaler Inhale 2 puffs into the lungs 2 (two) times daily as needed (for shortness of breath).  Yes Historical Provider, MD  bumetanide (BUMEX) 2 MG tablet Take 1 tablet (2 mg total) by mouth 2 (two) times daily. 05/22/16  Yes Erick Blinks, MD  carvedilol (COREG) 25 MG tablet Take 1 tablet (25 mg total) by mouth 2 (two) times daily with a meal. 06/25/16  Yes Kathlen Mody, MD  docusate sodium (COLACE) 100 MG capsule Take 1 capsule (100 mg total) by mouth 2 (two) times  daily. 06/25/16  Yes Kathlen Mody, MD  ferrous gluconate (FERGON) 324 MG tablet Take 1 tablet (324 mg total) by mouth 2 (two) times daily with a meal. 06/25/16  Yes Kathlen Mody, MD  furosemide (LASIX) 40 MG tablet Take 1 tablet (40 mg total) by mouth 2 (two) times daily. 06/25/16  Yes Kathlen Mody, MD  lisinopril (PRINIVIL,ZESTRIL) 5 MG tablet Take 1 tablet (5 mg total) by mouth daily. 06/25/16  Yes Kathlen Mody, MD  pantoprazole (PROTONIX) 40 MG tablet Take 1 tablet (40 mg total) by mouth daily at 6 (six) AM. 06/26/16  Yes Kathlen Mody, MD  potassium chloride SA (K-DUR,KLOR-CON) 20 MEQ tablet Take 2 tablets (40 mEq total) by mouth daily. 06/25/16  Yes Kathlen Mody, MD  rivaroxaban (XARELTO) 20 MG TABS tablet Take 1 tablet (20 mg total) by mouth daily with breakfast. 06/26/16  Yes Kathlen Mody, MD  spironolactone (ALDACTONE) 50 MG tablet Take 1 tablet (50 mg total) by mouth daily. 06/25/16  Yes Kathlen Mody, MD  tamsulosin (FLOMAX) 0.4 MG CAPS capsule Take 1 capsule (0.4 mg total) by mouth daily. 06/25/16  Yes Kathlen Mody, MD    Family History Family History  Problem Relation Age of Onset  . Stroke Father   . Heart attack Father   . Aneurysm Mother     Cerebral aneurysm  . Hypertension Sister   . Colon cancer Neg Hx   . Inflammatory bowel disease Neg Hx   . Liver disease Neg Hx     Social History Social History  Substance Use Topics  . Smoking status: Former Smoker    Packs/day: 0.50    Years: 20.00    Types: Cigarettes    Start date: 04/26/1988    Quit date: 06/23/2007  . Smokeless tobacco: Never Used     Comment: 1 ppd former smoker  . Alcohol use No     Comment: No etoh since 2009     Allergies   Bee venom; Food; Lipitor [atorvastatin]; Aspirin; Banana; Metolazone; Orange juice [orange oil]; Torsemide; Diltiazem; and Hydralazine   Review of Systems Review of Systems  Respiratory: Positive for shortness of breath.   Cardiovascular: Positive for leg swelling.  All other systems reviewed  and are negative.    Physical Exam Updated Vital Signs BP (!) 146/98 (BP Location: Left Arm)   Pulse 89   Temp 98.9 F (37.2 C) (Oral)   Resp 20   Ht 6' (1.829 m)   Wt (!) 313 lb (142 kg)   SpO2 99%   BMI 42.45 kg/m   Physical Exam  Constitutional: He is oriented to person, place, and time. He appears well-developed and well-nourished. No distress.  HENT:  Head: Normocephalic and atraumatic.  Eyes: Conjunctivae and EOM are normal.  Neck: Neck supple. No tracheal deviation present.  Cardiovascular: An irregular rhythm present. Tachycardia present.   Pulmonary/Chest: Effort normal. No respiratory distress. He has decreased breath sounds. He has wheezes. He has no rales.  Decreased breath sounds bilaterally. Right greater than left lower leg swelling with 1+ pitting edema.  Musculoskeletal: Normal range of motion. He exhibits edema.  Neurological: He is alert and oriented to person, place, and time.  Skin: Skin is warm and dry.  Psychiatric: He has a normal mood and affect. His behavior is normal.  Nursing note and vitals reviewed.    ED Treatments / Results  DIAGNOSTIC STUDIES: Oxygen Saturation is 97% on RA, nml by my interpretation.    COORDINATION OF CARE: 8:52 AM Discussed treatment plan with pt at bedside and pt agreed to plan.   Labs (all labs ordered are listed, but only abnormal results are displayed) Labs Reviewed  BASIC METABOLIC PANEL - Abnormal; Notable for the following:       Result Value   Calcium 8.7 (*)    All other components within normal limits  CBC - Abnormal; Notable for the following:    RBC 4.11 (*)    Hemoglobin 10.2 (*)    HCT 32.8 (*)    MCH 24.8 (*)    RDW 16.8 (*)    All other components within normal limits  TROPONIN I - Abnormal; Notable for the following:    Troponin I 0.03 (*)    All other components within normal limits  BRAIN NATRIURETIC PEPTIDE - Abnormal; Notable for the following:    B Natriuretic Peptide 808.0 (*)     All other components within normal limits  BLOOD GAS, ARTERIAL - Abnormal; Notable for the following:    pO2, Arterial 67.5 (*)    All other components within normal limits    EKG  EKG Interpretation  Date/Time:  Sunday July 19 2016 07:47:24 EST Ventricular Rate:  103 PR Interval:    QRS Duration: 92 QT Interval:  380 QTC Calculation: 498 R Axis:   73 Text Interpretation:  Atrial fibrillation Ventricular premature complex Borderline repolarization abnormality Borderline prolonged QT interval No significant change since last tracing Confirmed by [facility] MD, Antonya Leeder [phone]) on 07/19/2016 8:44:46 AM       Radiology Dg Chest 2 View  Result Date: 07/19/2016 CLINICAL DATA:  Shortness of breath for 2 days EXAM: CHEST  2 VIEW COMPARISON:  07/07/2016 FINDINGS: Cardiomegaly with vascular congestion. No overt edema. No confluent opacities or effusions. IMPRESSION: Cardiomegaly, vascular congestion. Electronically Signed   By: Charlett Nose M.D.   On: 07/19/2016 08:37    Procedures Procedures (including critical care time)  Medications Ordered in ED Medications  furosemide (LASIX) injection 80 mg (80 mg Intravenous Given 07/19/16 0924)     Initial Impression / Assessment and Plan / ED Course  I have reviewed the triage vital signs and the nursing notes.  Pertinent labs & imaging results that were available during my care of the patient were reviewed by me and considered in my medical decision making (see chart for details).     Acute on chronic chf exacerbation. Diuresed some with 80 IV lasix but still resp distress and O2 in low 80's on ambulation. Plan for   Final Clinical Impressions(s) / ED Diagnoses   Final diagnoses:  Acute on chronic combined systolic and diastolic CHF (congestive heart failure) (HCC)    New Prescriptions Current Discharge Medication List     I personally performed the services described in this documentation, which was scribed in my presence. The  recorded information has been reviewed and is accurate.      Marily Memos, MD 07/19/16 1344

## 2016-07-19 NOTE — ED Notes (Signed)
Pt stable and ready for transport to [facility]. Report given to Rozann Lesches, RN.

## 2016-07-19 NOTE — H&P (Signed)
History and Physical    [name] WGN:[id number] DOB: [birth year] DOA: 07/19/2016  Referring MD/NP/PA: Marily Memos, EDP PCP: Pearson Grippe, MD  Patient coming from: Home  Chief Complaint: Shortness of breath, lower extremity swelling  HPI: [name] is a 51 y.o. male well-known to [place] for repeated hospitalizations for acute on chronic CHF exacerbations due to medication and diet noncompliance. Most recently discharged from the hospital n January 4 for the same. Saw cardiology in the interim and had already gained 8 pounds from discharge weight. He states he had been taking his Bumex only once daily and sometimes not even at all because "this makes his legs swell". He presents today due to worsening shortness of breath and edema and admission has been requested.  Past Medical/Surgical History: Past Medical History:  Diagnosis Date  . Allergic rhinitis   . Asthma   . Atrial fibrillation (HCC)   . CHF (congestive heart failure) (HCC)   . Essential hypertension   . Gastroesophageal reflux disease   . Heart attack   . History of cardiac catheterization    Normal coronaries December 2016  . Noncompliance    Major problem leading to declining health and recurrent hospitalization  . Nonischemic cardiomyopathy (HCC)    LVEF 20-25%  . OSA (obstructive sleep apnea)   . Osteoarthritis   . Peptic ulcer disease     Past Surgical History:  Procedure Laterality Date  . CARDIAC CATHETERIZATION N/A 06/14/2015   Procedure: Right/Left Heart Cath and Coronary Angiography;  Surgeon: Runell Gess, MD;  Location: [facility] INVASIVE CV LAB;  Service: Cardiovascular;  Laterality: N/A;    Social History:  reports that he quit smoking about 9 years ago. His smoking use included Cigarettes. He started smoking about 28 years ago. He has a 10.00 pack-year smoking history. He has never used smokeless tobacco. He reports that he does not drink alcohol or use drugs.  Allergies: Allergies  Allergen  Reactions  . Bee Venom Shortness Of Breath, Swelling and Other (See Comments)    Reaction:  Facial swelling  . Food Shortness Of Breath, Rash and Other (See Comments)    Pt states that he is allergic to strawberries.    . Lipitor [Atorvastatin] Shortness Of Breath and Other (See Comments)    Reaction:  Nose bleeds   . Aspirin Other (See Comments)    Reaction:  GI upset   . Banana Diarrhea  . Metolazone Other (See Comments)    Patient states that he stopped taking due to "heart attack" like symptoms  . Orange Juice [Orange Oil] Nausea And Vomiting  . Torsemide Swelling and Other (See Comments)    Reaction:  Swelling of feet/legs   . Diltiazem Palpitations  . Hydralazine Palpitations    Family History:  Family History  Problem Relation Age of Onset  . Stroke Father   . Heart attack Father   . Aneurysm Mother     Cerebral aneurysm  . Hypertension Sister   . Colon cancer Neg Hx   . Inflammatory bowel disease Neg Hx   . Liver disease Neg Hx     Prior to Admission medications   Medication Sig Start Date End Date Taking? Authorizing Provider  atorvastatin (LIPITOR) 40 MG tablet Take 1 tablet (40 mg total) by mouth daily at 6 PM. 06/25/16  Yes Kathlen Mody, MD  beclomethasone (QVAR) 80 MCG/ACT inhaler Inhale 2 puffs into the lungs 2 (two) times daily as needed (for shortness of breath).   Yes  Historical Provider, MD  bumetanide (BUMEX) 2 MG tablet Take 1 tablet (2 mg total) by mouth 2 (two) times daily. 05/22/16  Yes Erick Blinks, MD  carvedilol (COREG) 25 MG tablet Take 1 tablet (25 mg total) by mouth 2 (two) times daily with a meal. 06/25/16  Yes Kathlen Mody, MD  docusate sodium (COLACE) 100 MG capsule Take 1 capsule (100 mg total) by mouth 2 (two) times daily. 06/25/16  Yes Kathlen Mody, MD  ferrous gluconate (FERGON) 324 MG tablet Take 1 tablet (324 mg total) by mouth 2 (two) times daily with a meal. 06/25/16  Yes Kathlen Mody, MD  furosemide (LASIX) 40 MG tablet Take 1 tablet (40 mg  total) by mouth 2 (two) times daily. 06/25/16  Yes Kathlen Mody, MD  lisinopril (PRINIVIL,ZESTRIL) 5 MG tablet Take 1 tablet (5 mg total) by mouth daily. 06/25/16  Yes Kathlen Mody, MD  pantoprazole (PROTONIX) 40 MG tablet Take 1 tablet (40 mg total) by mouth daily at 6 (six) AM. 06/26/16  Yes Kathlen Mody, MD  potassium chloride SA (K-DUR,KLOR-CON) 20 MEQ tablet Take 2 tablets (40 mEq total) by mouth daily. 06/25/16  Yes Kathlen Mody, MD  rivaroxaban (XARELTO) 20 MG TABS tablet Take 1 tablet (20 mg total) by mouth daily with breakfast. 06/26/16  Yes Kathlen Mody, MD  spironolactone (ALDACTONE) 50 MG tablet Take 1 tablet (50 mg total) by mouth daily. 06/25/16  Yes Kathlen Mody, MD  tamsulosin (FLOMAX) 0.4 MG CAPS capsule Take 1 capsule (0.4 mg total) by mouth daily. 06/25/16  Yes Kathlen Mody, MD    Review of Systems:  Constitutional: Denies fever, chills, diaphoresis, appetite change and fatigue.  HEENT: Denies photophobia, eye pain, redness, hearing loss, ear pain, congestion, sore throat, rhinorrhea, sneezing, mouth sores, trouble swallowing, neck pain, neck stiffness and tinnitus.   Respiratory: Denies SOB, DOE, cough, chest tightness,  and wheezing.   Cardiovascular: Denies chest pain, palpitations and leg swelling.  Gastrointestinal: Denies nausea, vomiting, abdominal pain, diarrhea, constipation, blood in stool and abdominal distention.  Genitourinary: Denies dysuria, urgency, frequency, hematuria, flank pain and difficulty urinating.  Endocrine: Denies: hot or cold intolerance, sweats, changes in hair or nails, polyuria, polydipsia. Musculoskeletal: Denies myalgias, back pain, joint swelling, arthralgias and gait problem.  Skin: Denies pallor, rash and wound.  Neurological: Denies dizziness, seizures, syncope, weakness, light-headedness, numbness and headaches.  Hematological: Denies adenopathy. Easy bruising, personal or family bleeding history  Psychiatric/Behavioral: Denies suicidal ideation, mood  changes, confusion, nervousness, sleep disturbance and agitation    Physical Exam: Vitals:   07/19/16 1145 07/19/16 1200 07/19/16 1215 07/19/16 1239  BP:  (!) 166/120  (!) 146/98  Pulse: 105 (!) 55 73 89  Resp: (!) 4 23 21 20   Temp:    98.9 F (37.2 C)  TempSrc:    Oral  SpO2: 97% 98% 100% 99%  Weight:    (!) 142 kg (313 lb)  Height:         Constitutional: NAD, calm, comfortable Eyes: PERRL, lids and conjunctivae normal ENMT: Mucous membranes are moist. Posterior pharynx clear of any exudate or lesions.Normal dentition.  Neck: normal, supple, no masses, no thyromegaly Respiratory: clear to auscultation bilaterally, no wheezing, no crackles. Normal respiratory effort. No accessory muscle use.  Cardiovascular: Regular rate and rhythm, no murmurs / rubs / gallops. No extremity edema. 2+ pedal pulses. No carotid bruits.  Abdomen: no tenderness, no masses palpated. No hepatosplenomegaly. Bowel sounds positive.  Musculoskeletal: no clubbing / cyanosis. No joint deformity upper and lower  extremities. Good ROM, no contractures. Normal muscle tone. 4+ pitting edema bilaterally up to hips Neurologic: CN 2-12 grossly intact. Sensation intact, DTR normal. Strength 5/5 in all 4.  Psychiatric: Normal judgment and insight. Alert and oriented x 3. Normal mood.    Labs on Admission: I have personally reviewed the following labs and imaging studies  CBC:  Recent Labs Lab 07/19/16 0753  WBC 4.3  HGB 10.2*  HCT 32.8*  MCV 79.8  PLT 221   Basic Metabolic Panel:  Recent Labs Lab 07/19/16 0753  NA 140  K 3.5  CL 110  CO2 24  GLUCOSE 91  BUN 18  CREATININE 1.00  CALCIUM 8.7*   GFR: Estimated Creatinine Clearance: 132.1 mL/min (by C-G formula based on SCr of 1 mg/dL). Liver Function Tests: No results for input(s): AST, ALT, ALKPHOS, BILITOT, PROT, ALBUMIN in the last 168 hours. No results for input(s): LIPASE, AMYLASE in the last 168 hours. No results for input(s): AMMONIA in  the last 168 hours. Coagulation Profile: No results for input(s): INR, PROTIME in the last 168 hours. Cardiac Enzymes:  Recent Labs Lab 07/19/16 0753  TROPONINI 0.03*   BNP (last 3 results) No results for input(s): PROBNP in the last 8760 hours. HbA1C: No results for input(s): HGBA1C in the last 72 hours. CBG: No results for input(s): GLUCAP in the last 168 hours. Lipid Profile: No results for input(s): CHOL, HDL, LDLCALC, TRIG, CHOLHDL, LDLDIRECT in the last 72 hours. Thyroid Function Tests: No results for input(s): TSH, T4TOTAL, FREET4, T3FREE, THYROIDAB in the last 72 hours. Anemia Panel: No results for input(s): VITAMINB12, FOLATE, FERRITIN, TIBC, IRON, RETICCTPCT in the last 72 hours. Urine analysis:    Component Value Date/Time   COLORURINE YELLOW 05/01/2016 2104   APPEARANCEUR CLEAR 05/01/2016 2104   LABSPEC 1.025 05/01/2016 2104   PHURINE 6.0 05/01/2016 2104   GLUCOSEU NEGATIVE 05/01/2016 2104   GLUCOSEU NEG mg/dL 16/03/9603 5409   HGBUR NEGATIVE 05/01/2016 2104   BILIRUBINUR NEGATIVE 05/01/2016 2104   KETONESUR NEGATIVE 05/01/2016 2104   PROTEINUR 100 (A) 05/01/2016 2104   UROBILINOGEN 0.2 08/08/2014 1445   NITRITE NEGATIVE 05/01/2016 2104   LEUKOCYTESUR NEGATIVE 05/01/2016 2104   Sepsis Labs: @LABRCNTIP (procalcitonin:4,lacticidven:4) )No results found for this or any previous visit (from the past 240 hour(s)).   Radiological Exams on Admission: Dg Chest 2 View  Result Date: 07/19/2016 CLINICAL DATA:  Shortness of breath for 2 days EXAM: CHEST  2 VIEW COMPARISON:  07/07/2016 FINDINGS: Cardiomegaly with vascular congestion. No overt edema. No confluent opacities or effusions. IMPRESSION: Cardiomegaly, vascular congestion. Electronically Signed   By: Charlett Nose M.D.   On: 07/19/2016 08:37    EKG: Independently reviewed. A. fib with a rate of 103  Assessment/Plan Active Problems:   Hyperlipidemia   OBESITY, MORBID   Essential hypertension   Atrial  fibrillation (HCC)   CKD (chronic kidney disease), stage II   Acute on chronic combined systolic and diastolic CHF (congestive heart failure) (HCC)    Acute on chronic combined CHF -Echo from January 2018 shows an ejection fraction of 30-35% with moderate diffuse hypokinesis and unable to exclude wall motion abnormalities. -He has a known history of diet nonadherence to medication noncompliance. -We'll start on Lasix 60 mg IV twice daily, strict intake and output, daily weights, strict for negative fluid balance. -Continue ACE inhibitor and beta blocker.  Chronic kidney disease stage II -Baseline creatinine around 1-1.3, at baseline.  Paroxysmal atrial fibrillation -Continue anticoagulation with Xarelto. -Currently rate controlled  Hyperlipidemia -Continue statin.  Hypertension -BP elevated, expect improvement with diuresis. -Continue Coreg, Lasix, lisinopril, spironolactone   DVT prophylaxis: Xarelto  Code Status: Full code  Family Communication: Patient only  Disposition Plan: Dissipate discharge home in about 72 hours  Consults called: None  Admission status: Inpatient    Time Spent: 75 minutes  Chaya Jan MD [facility] Pager [phone]  If 7PM-7AM, please contact night-coverage [email or web address] Password [facility]  07/19/2016, 4:19 PM

## 2016-07-19 NOTE — ED Notes (Signed)
CRITICAL VALUE ALERT  Critical value received:  Troponin = 0.03  Date of notification:  07/20/15  Time of notification:  0851  Critical value read back:Yes.    Nurse who received alert:  Antony Odea  MD notified (1st page):  [place]  Time of first page:  [phone]  MD notified (2nd page):  Time of second page:  Responding MD:  Erin Hearing  Time MD responded:  [phone]

## 2016-07-19 NOTE — ED Notes (Signed)
RT notified for ABG

## 2016-07-19 NOTE — ED Triage Notes (Signed)
Pt c/o sob today with ble edema x 2 days.

## 2016-07-20 LAB — BASIC METABOLIC PANEL
Anion gap: 8 (ref 5–15)
BUN: 19 mg/dL (ref 6–20)
CALCIUM: 8.5 mg/dL — AB (ref 8.9–10.3)
CO2: 27 mmol/L (ref 22–32)
CREATININE: 1.14 mg/dL (ref 0.61–1.24)
Chloride: 105 mmol/L (ref 101–111)
Glucose, Bld: 92 mg/dL (ref 65–99)
Potassium: 3.4 mmol/L — ABNORMAL LOW (ref 3.5–5.1)
SODIUM: 140 mmol/L (ref 135–145)

## 2016-07-20 NOTE — Progress Notes (Signed)
PROGRESS NOTE    [name]  VZC:[id number] DOB: 06/09/1966 DOA: 07/19/2016 PCP: Pearson Grippe, MD     Brief Narrative:  51 year old man admitted to the hospital from home on 1/28 with acute on chronic combined CHF. Well-known to [place] for repeated hospitalizations for the same due to diet and medication noncompliance.   Assessment & Plan:   Principal Problem:   Acute on chronic combined systolic and diastolic CHF (congestive heart failure) (HCC) Active Problems:   Hyperlipidemia   OBESITY, MORBID   Essential hypertension   Atrial fibrillation (HCC)   CKD (chronic kidney disease), stage II   Acute on chronic combined CHF -7.8 L negative since admission, still markedly volume overloaded, plan to continue current dose of Lasix 60 mg IV twice daily. -Continue beta blocker, ACE inhibitor. -Known ejection fraction of 20-25%.  Chronic kidney disease stage II -Creatinine remains at baseline  Paroxysmal atrial fibrillation -Rate controlled, continue anticoagulation with Xarelto  Hyperlipidemia -Continue statin  Hypertension -Fair control, continue coreg, Lasix, lisinopril, spironolactone   DVT prophylaxis: Xarelto Code Status: Full code Family Communication: Patient only Disposition Plan: Discharge home in 48-72 hours  Consultants:   None  Procedures:   None  Antimicrobials:  Anti-infectives    None       Subjective: Resting peacefully in bed, able to answer questions appropriately, states his shortness of breath and swelling is improved  Objective: Vitals:   07/19/16 2147 07/20/16 0702 07/20/16 0807 07/20/16 1400  BP: (!) 129/96 125/87  128/89  Pulse: 76 88  88  Resp: 18 18  20   Temp: 98.6 F (37 C) 98.6 F (37 C)  99.1 F (37.3 C)  TempSrc: Oral Oral  Oral  SpO2: 99% 100% 97% 97%  Weight:      Height:        Intake/Output Summary (Last 24 hours) at 07/20/16 1524 Last data filed at 07/20/16 1000  Gross per 24 hour  Intake             2222 ml    Output             6400 ml  Net            -4178 ml   Filed Weights   07/19/16 0743 07/19/16 1239  Weight: (!) 138.3 kg (305 lb) (!) 142 kg (313 lb)    Examination:   General exam: Alert, awake, oriented x 3 Respiratory system: Clear to auscultation. Respiratory effort normal. Cardiovascular system:RRR. No murmurs, rubs, gallops. Gastrointestinal system: Abdomen is nondistended, soft and nontender. No organomegaly or masses felt. Normal bowel sounds heard. Central nervous system: Alert and oriented. No focal neurological deficits. Extremities: 3+ pitting edema bilaterally Skin: No rashes, lesions or ulcers Psychiatry: Judgement and insight appear normal. Mood & affect appropriate.     Data Reviewed: I have personally reviewed following labs and imaging studies  CBC:  Recent Labs Lab 07/19/16 0753  WBC 4.3  HGB 10.2*  HCT 32.8*  MCV 79.8  PLT 221   Basic Metabolic Panel:  Recent Labs Lab 07/19/16 0753 07/20/16 0703  NA 140 140  K 3.5 3.4*  CL 110 105  CO2 24 27  GLUCOSE 91 92  BUN 18 19  CREATININE 1.00 1.14  CALCIUM 8.7* 8.5*   GFR: Estimated Creatinine Clearance: 115.9 mL/min (by C-G formula based on SCr of 1.14 mg/dL). Liver Function Tests: No results for input(s): AST, ALT, ALKPHOS, BILITOT, PROT, ALBUMIN in the last 168 hours. No results for input(s):  LIPASE, AMYLASE in the last 168 hours. No results for input(s): AMMONIA in the last 168 hours. Coagulation Profile: No results for input(s): INR, PROTIME in the last 168 hours. Cardiac Enzymes:  Recent Labs Lab 07/19/16 0753  TROPONINI 0.03*   BNP (last 3 results) No results for input(s): PROBNP in the last 8760 hours. HbA1C: No results for input(s): HGBA1C in the last 72 hours. CBG: No results for input(s): GLUCAP in the last 168 hours. Lipid Profile: No results for input(s): CHOL, HDL, LDLCALC, TRIG, CHOLHDL, LDLDIRECT in the last 72 hours. Thyroid Function Tests: No results for input(s):  TSH, T4TOTAL, FREET4, T3FREE, THYROIDAB in the last 72 hours. Anemia Panel: No results for input(s): VITAMINB12, FOLATE, FERRITIN, TIBC, IRON, RETICCTPCT in the last 72 hours. Urine analysis:    Component Value Date/Time   COLORURINE YELLOW 05/01/2016 2104   APPEARANCEUR CLEAR 05/01/2016 2104   LABSPEC 1.025 05/01/2016 2104   PHURINE 6.0 05/01/2016 2104   GLUCOSEU NEGATIVE 05/01/2016 2104   GLUCOSEU NEG mg/dL 16/03/9603 5409   HGBUR NEGATIVE 05/01/2016 2104   BILIRUBINUR NEGATIVE 05/01/2016 2104   KETONESUR NEGATIVE 05/01/2016 2104   PROTEINUR 100 (A) 05/01/2016 2104   UROBILINOGEN 0.2 08/08/2014 1445   NITRITE NEGATIVE 05/01/2016 2104   LEUKOCYTESUR NEGATIVE 05/01/2016 2104   Sepsis Labs: @LABRCNTIP (procalcitonin:4,lacticidven:4)  )No results found for this or any previous visit (from the past 240 hour(s)).       Radiology Studies: Dg Chest 2 View  Result Date: 07/19/2016 CLINICAL DATA:  Shortness of breath for 2 days EXAM: CHEST  2 VIEW COMPARISON:  07/07/2016 FINDINGS: Cardiomegaly with vascular congestion. No overt edema. No confluent opacities or effusions. IMPRESSION: Cardiomegaly, vascular congestion. Electronically Signed   By: Charlett Nose M.D.   On: 07/19/2016 08:37        Scheduled Meds: . atorvastatin  40 mg Oral q1800  . budesonide (PULMICORT) nebulizer solution  0.25 mg Nebulization BID  . carvedilol  25 mg Oral BID WC  . docusate sodium  100 mg Oral BID  . ferrous gluconate  324 mg Oral BID WC  . furosemide  60 mg Intravenous Q12H  . lisinopril  5 mg Oral Daily  . pantoprazole  40 mg Oral Q0600  . potassium chloride SA  40 mEq Oral Daily  . rivaroxaban  20 mg Oral Q breakfast  . sodium chloride flush  3 mL Intravenous Q12H  . spironolactone  50 mg Oral Daily  . tamsulosin  0.4 mg Oral Daily   Continuous Infusions:   LOS: 1 day    Time spent: 25 minutes. Greater than 50% of this time was spent in direct contact with the patient coordinating  care.     Chaya Jan, MD [facility] Pager [phone]  If 7PM-7AM, please contact night-coverage [email or web address] Password [email or web address] 07/20/2016, 3:24 PM

## 2016-07-21 LAB — BASIC METABOLIC PANEL
ANION GAP: 9 (ref 5–15)
BUN: 22 mg/dL — AB (ref 6–20)
CHLORIDE: 102 mmol/L (ref 101–111)
CO2: 28 mmol/L (ref 22–32)
Calcium: 8.6 mg/dL — ABNORMAL LOW (ref 8.9–10.3)
Creatinine, Ser: 1.25 mg/dL — ABNORMAL HIGH (ref 0.61–1.24)
GFR calc Af Amer: >60 mL/min (ref >60)
Glucose, Bld: 86 mg/dL (ref 65–99)
POTASSIUM: 3.5 mmol/L (ref 3.5–5.1)
SODIUM: 139 mmol/L (ref 135–145)

## 2016-07-21 NOTE — Progress Notes (Signed)
PROGRESS NOTE    [name]  ZOX:[id number] DOB: Dec 12, 1965 DOA: 07/19/2016 PCP: Pearson Grippe, MD     Brief Narrative:  51 year old man admitted to the hospital from home on 1/28 with acute on chronic combined CHF. Well-known to [place] for repeated hospitalizations for the same due to diet and medication noncompliance.   Assessment & Plan:   Principal Problem:   Acute on chronic combined systolic and diastolic CHF (congestive heart failure) (HCC) Active Problems:   Hyperlipidemia   OBESITY, MORBID   Essential hypertension   Atrial fibrillation (HCC)   CKD (chronic kidney disease), stage II   Acute on chronic combined CHF -10.2 L negative since admission, still markedly volume overloaded, plan to continue current dose of Lasix 60 mg IV twice daily. -Continue beta blocker, ACE inhibitor. -Known ejection fraction of 20-25%.  Chronic kidney disease stage II -Creatinine remains at baseline, although slowly increasing. -Recheck renal function in a.m.  Paroxysmal atrial fibrillation -Rate controlled, continue anticoagulation with Xarelto  Hyperlipidemia -Continue statin  Hypertension -Fair control, continue coreg, Lasix, lisinopril, spironolactone   DVT prophylaxis: Xarelto Code Status: Full code Family Communication: Patient only Disposition Plan: Discharge home in 48-72 hours  Consultants:   None  Procedures:   None  Antimicrobials:  Anti-infectives    None       Subjective: Resting peacefully in bed, able to answer questions appropriately, states his shortness of breath and swelling is improved  Objective: Vitals:   07/20/16 2132 07/21/16 0600 07/21/16 0748 07/21/16 1404  BP: (!) 114/98 (!) 148/99  119/87  Pulse: 89 84  79  Resp:  18  18  Temp: 97.7 F (36.5 C) 98.3 F (36.8 C)  98 F (36.7 C)  TempSrc: Oral Oral  Oral  SpO2: 100% 100% 94% 95%  Weight:      Height:        Intake/Output Summary (Last 24 hours) at 07/21/16 1651 Last data filed  at 07/21/16 1345  Gross per 24 hour  Intake              720 ml  Output             2800 ml  Net            -2080 ml   Filed Weights   07/19/16 0743 07/19/16 1239  Weight: (!) 138.3 kg (305 lb) (!) 142 kg (313 lb)    Examination:   General exam: Alert, awake, oriented x 3 Respiratory system: Clear to auscultation. Respiratory effort normal. Cardiovascular system:RRR. No murmurs, rubs, gallops. Gastrointestinal system: Abdomen is nondistended, soft and nontender. No organomegaly or masses felt. Normal bowel sounds heard. Central nervous system: Alert and oriented. No focal neurological deficits. Extremities: 3+ pitting edema bilaterally Skin: No rashes, lesions or ulcers Psychiatry: Judgement and insight appear normal. Mood & affect appropriate.     Data Reviewed: I have personally reviewed following labs and imaging studies  CBC:  Recent Labs Lab 07/19/16 0753  WBC 4.3  HGB 10.2*  HCT 32.8*  MCV 79.8  PLT 221   Basic Metabolic Panel:  Recent Labs Lab 07/19/16 0753 07/20/16 0703 07/21/16 0525  NA 140 140 139  K 3.5 3.4* 3.5  CL 110 105 102  CO2 24 27 28   GLUCOSE 91 92 86  BUN 18 19 22*  CREATININE 1.00 1.14 1.25*  CALCIUM 8.7* 8.5* 8.6*   GFR: Estimated Creatinine Clearance: 105.7 mL/min (by C-G formula based on SCr of 1.25 mg/dL (H)). Liver Function  Tests: No results for input(s): AST, ALT, ALKPHOS, BILITOT, PROT, ALBUMIN in the last 168 hours. No results for input(s): LIPASE, AMYLASE in the last 168 hours. No results for input(s): AMMONIA in the last 168 hours. Coagulation Profile: No results for input(s): INR, PROTIME in the last 168 hours. Cardiac Enzymes:  Recent Labs Lab 07/19/16 0753  TROPONINI 0.03*   BNP (last 3 results) No results for input(s): PROBNP in the last 8760 hours. HbA1C: No results for input(s): HGBA1C in the last 72 hours. CBG: No results for input(s): GLUCAP in the last 168 hours. Lipid Profile: No results for input(s):  CHOL, HDL, LDLCALC, TRIG, CHOLHDL, LDLDIRECT in the last 72 hours. Thyroid Function Tests: No results for input(s): TSH, T4TOTAL, FREET4, T3FREE, THYROIDAB in the last 72 hours. Anemia Panel: No results for input(s): VITAMINB12, FOLATE, FERRITIN, TIBC, IRON, RETICCTPCT in the last 72 hours. Urine analysis:    Component Value Date/Time   COLORURINE YELLOW 05/01/2016 2104   APPEARANCEUR CLEAR 05/01/2016 2104   LABSPEC 1.025 05/01/2016 2104   PHURINE 6.0 05/01/2016 2104   GLUCOSEU NEGATIVE 05/01/2016 2104   GLUCOSEU NEG mg/dL 63/81/7711 6579   HGBUR NEGATIVE 05/01/2016 2104   BILIRUBINUR NEGATIVE 05/01/2016 2104   KETONESUR NEGATIVE 05/01/2016 2104   PROTEINUR 100 (A) 05/01/2016 2104   UROBILINOGEN 0.2 08/08/2014 1445   NITRITE NEGATIVE 05/01/2016 2104   LEUKOCYTESUR NEGATIVE 05/01/2016 2104   Sepsis Labs: @LABRCNTIP (procalcitonin:4,lacticidven:4)  )No results found for this or any previous visit (from the past 240 hour(s)).       Radiology Studies: No results found.      Scheduled Meds: . atorvastatin  40 mg Oral q1800  . budesonide (PULMICORT) nebulizer solution  0.25 mg Nebulization BID  . carvedilol  25 mg Oral BID WC  . docusate sodium  100 mg Oral BID  . ferrous gluconate  324 mg Oral BID WC  . furosemide  60 mg Intravenous Q12H  . lisinopril  5 mg Oral Daily  . pantoprazole  40 mg Oral Q0600  . potassium chloride SA  40 mEq Oral Daily  . rivaroxaban  20 mg Oral Q breakfast  . sodium chloride flush  3 mL Intravenous Q12H  . spironolactone  50 mg Oral Daily  . tamsulosin  0.4 mg Oral Daily   Continuous Infusions:   LOS: 2 days    Time spent: 25 minutes. Greater than 50% of this time was spent in direct contact with the patient coordinating care.     Chaya Jan, MD [facility] Pager [phone]  If 7PM-7AM, please contact night-coverage [email or web address] Password [email or web address] 07/21/2016, 4:51 PM

## 2016-07-22 DIAGNOSIS — I482 Chronic atrial fibrillation: Secondary | ICD-10-CM

## 2016-07-22 DIAGNOSIS — I5043 Acute on chronic combined systolic (congestive) and diastolic (congestive) heart failure: Secondary | ICD-10-CM

## 2016-07-22 DIAGNOSIS — I10 Essential (primary) hypertension: Secondary | ICD-10-CM

## 2016-07-22 DIAGNOSIS — N182 Chronic kidney disease, stage 2 (mild): Secondary | ICD-10-CM

## 2016-07-22 LAB — BASIC METABOLIC PANEL
ANION GAP: 8 (ref 5–15)
BUN: 23 mg/dL — ABNORMAL HIGH (ref 6–20)
CO2: 27 mmol/L (ref 22–32)
Calcium: 8.6 mg/dL — ABNORMAL LOW (ref 8.9–10.3)
Chloride: 104 mmol/L (ref 101–111)
Creatinine, Ser: 1.19 mg/dL (ref 0.61–1.24)
Glucose, Bld: 92 mg/dL (ref 65–99)
POTASSIUM: 3.7 mmol/L (ref 3.5–5.1)
SODIUM: 139 mmol/L (ref 135–145)

## 2016-07-22 MED ORDER — FUROSEMIDE 10 MG/ML IJ SOLN
80.0000 mg | Freq: Two times a day (BID) | INTRAMUSCULAR | Status: DC
  Administered 2016-07-22 – 2016-07-24 (×4): 80 mg via INTRAVENOUS
  Filled 2016-07-22 (×4): qty 8

## 2016-07-22 NOTE — Progress Notes (Signed)
PROGRESS NOTE  [name] JQZ:[id number] DOB: [birth year] DOA: 07/19/2016 PCP: Pearson Grippe, MD  Brief History:  51 year old male with a history of systolic and diastolic CHF with repeated hospitalizations due to poor medication and diet compliance, persistent atrial fibrillation, hypertension, GERD presented with increasing shortness of breath and lower extremity edema with associated weight gain. The patient has a well-documented history of poor compliance with diet and his medications. He was recently discharged from the hospital after a stay from 06/22/2016 through 06/26/2016 for CHF exacerbation. He was discharged with Bumex 2 mg twice a day. The patient states that he would only take his Bumex and carvedilol once daily on numerous occasions.  Pt was started on IV lasix which has had to be titrated up for good appropriate clinical response.  Assessment/Plan: Acute on chronic systolic and diastolic CHF -06/22/2016 echo--EF 30-35%, diffuse HK -Increase furosemide to 80 mg IV twice a day --daily weights Neg 17 lbs since admission -review of medical records shows his dry weight ~ 283-284 lbs -accurate I/O's -fluid restrict -NEG 12.2 L for the admission although question accuracy of intake measurement -07/22/16--pt eating [employer] in his room -Continue carvedilol, Aldactone, lisinopril  Persistent A Fib -continue rivaroxaban -rate controlled  CKD stage II -Baseline creatinine 1.0-1.3 -Am BMP  Hypertension -Improving with diuresis -Continue carvedilol, furosemide, lisinopril, spironolactone  Hyperlipidemia -Continue statin  COPD -Continue Pulmicort  Medical Noncompliance -Pt has [employer] in room 1/31 -discussed compliance issues   Disposition Plan:   Home in 2-3 days  Family Communication:  No Family at bedside--Total time spent 35 minutes.  Greater than 50% spent face to face counseling and coordinating care.   Consultants:  none  Code Status:  FULL  DVT  Prophylaxis:  Xarelto   Procedures: As Listed in Progress Note Above  Antibiotics: None    Subjective: Patient denies fevers, chills, headache, chest pain, dyspnea, nausea, vomiting, diarrhea, abdominal pain, dysuria, hematuria, hematochezia, and melena.   Objective: Vitals:   07/21/16 2340 07/22/16 0542 07/22/16 0805 07/22/16 1500  BP: 118/71 118/71  137/89  Pulse: 61 81  61  Resp: 18 18  17   Temp: 98.5 F (36.9 C) 98.2 F (36.8 C)  97.5 F (36.4 C)  TempSrc: Oral Oral  Oral  SpO2: 98% 100% 96% 100%  Weight:  134.4 kg (296 lb 3.2 oz)    Height:        Intake/Output Summary (Last 24 hours) at 07/22/16 1649 Last data filed at 07/22/16 1500  Gross per 24 hour  Intake              720 ml  Output             2745 ml  Net            -2025 ml   Weight change:  Exam:   General:  Pt is alert, follows commands appropriately, not in acute distress  HEENT: No icterus, No thrush, No neck mass, [place]/AT  Cardiovascular: IRRR, S1/S2, no rubs, no gallops  Respiratory: Bibasilar crackles. No wheezing. Good air movement  Abdomen: Soft/+BS, non tender, non distended, no guarding  Extremities: 2 + LE edema, No lymphangitis, No petechiae, No rashes, no synovitis   Data Reviewed: I have personally reviewed following labs and imaging studies Basic Metabolic Panel:  Recent Labs Lab 07/19/16 0753 07/20/16 0703 07/21/16 0525 07/22/16 0552  NA 140 140 139 139  K 3.5 3.4* 3.5 3.7  CL  110 105 102 104  CO2 24 27 28 27   GLUCOSE 91 92 86 92  BUN 18 19 22* 23*  CREATININE 1.00 1.14 1.25* 1.19  CALCIUM 8.7* 8.5* 8.6* 8.6*   Liver Function Tests: No results for input(s): AST, ALT, ALKPHOS, BILITOT, PROT, ALBUMIN in the last 168 hours. No results for input(s): LIPASE, AMYLASE in the last 168 hours. No results for input(s): AMMONIA in the last 168 hours. Coagulation Profile: No results for input(s): INR, PROTIME in the last 168 hours. CBC:  Recent Labs Lab 07/19/16 0753    WBC 4.3  HGB 10.2*  HCT 32.8*  MCV 79.8  PLT 221   Cardiac Enzymes:  Recent Labs Lab 07/19/16 0753  TROPONINI 0.03*   BNP: Invalid input(s): POCBNP CBG: No results for input(s): GLUCAP in the last 168 hours. HbA1C: No results for input(s): HGBA1C in the last 72 hours. Urine analysis:    Component Value Date/Time   COLORURINE YELLOW 05/01/2016 2104   APPEARANCEUR CLEAR 05/01/2016 2104   LABSPEC 1.025 05/01/2016 2104   PHURINE 6.0 05/01/2016 2104   GLUCOSEU NEGATIVE 05/01/2016 2104   GLUCOSEU NEG mg/dL 40/98/1191 4782   HGBUR NEGATIVE 05/01/2016 2104   BILIRUBINUR NEGATIVE 05/01/2016 2104   KETONESUR NEGATIVE 05/01/2016 2104   PROTEINUR 100 (A) 05/01/2016 2104   UROBILINOGEN 0.2 08/08/2014 1445   NITRITE NEGATIVE 05/01/2016 2104   LEUKOCYTESUR NEGATIVE 05/01/2016 2104   Sepsis Labs: @LABRCNTIP (procalcitonin:4,lacticidven:4) )No results found for this or any previous visit (from the past 240 hour(s)).   Scheduled Meds: . atorvastatin  40 mg Oral q1800  . budesonide (PULMICORT) nebulizer solution  0.25 mg Nebulization BID  . carvedilol  25 mg Oral BID WC  . docusate sodium  100 mg Oral BID  . ferrous gluconate  324 mg Oral BID WC  . furosemide  80 mg Intravenous Q12H  . lisinopril  5 mg Oral Daily  . pantoprazole  40 mg Oral Q0600  . potassium chloride SA  40 mEq Oral Daily  . rivaroxaban  20 mg Oral Q breakfast  . sodium chloride flush  3 mL Intravenous Q12H  . spironolactone  50 mg Oral Daily  . tamsulosin  0.4 mg Oral Daily   Continuous Infusions:  Procedures/Studies: Dg Chest 2 View  Result Date: 07/19/2016 CLINICAL DATA:  Shortness of breath for 2 days EXAM: CHEST  2 VIEW COMPARISON:  07/07/2016 FINDINGS: Cardiomegaly with vascular congestion. No overt edema. No confluent opacities or effusions. IMPRESSION: Cardiomegaly, vascular congestion. Electronically Signed   By: Charlett Nose M.D.   On: 07/19/2016 08:37   Dg Chest 2 View  Result Date:  07/05/2016 CLINICAL DATA:  Shortness of breath and swelling in the legs. History of CHF on Lasix. EXAM: CHEST  2 VIEW COMPARISON:  06/22/2016 FINDINGS: Cardiac enlargement. Mild fluid in the fissures. No pulmonary vascular congestion. No significant edema. No focal airspace disease or consolidation in the lungs. No blunting of costophrenic angles. No pneumothorax. Mild degenerative changes in the spine. IMPRESSION: Cardiac enlargement with fluid in the fissures. No evidence of significant vascular congestion. Electronically Signed   By: Burman Nieves M.D.   On: 07/05/2016 01:50    Genesi Stefanko, DO  [facility] Pager [phone]  If 7PM-7AM, please contact night-coverage [email or web address] Password [email or web address] 07/22/2016, 4:49 PM   LOS: 3 days

## 2016-07-23 DIAGNOSIS — E784 Other hyperlipidemia: Secondary | ICD-10-CM

## 2016-07-23 LAB — BASIC METABOLIC PANEL
ANION GAP: 8 (ref 5–15)
BUN: 25 mg/dL — ABNORMAL HIGH (ref 6–20)
CALCIUM: 8.6 mg/dL — AB (ref 8.9–10.3)
CO2: 28 mmol/L (ref 22–32)
Chloride: 103 mmol/L (ref 101–111)
Creatinine, Ser: 1.16 mg/dL (ref 0.61–1.24)
GFR calc Af Amer: >60 mL/min (ref >60)
GFR calc non Af Amer: >60 mL/min (ref >60)
GLUCOSE: 90 mg/dL (ref 65–99)
Potassium: 3.7 mmol/L (ref 3.5–5.1)
Sodium: 139 mmol/L (ref 135–145)

## 2016-07-23 LAB — MAGNESIUM: Magnesium: 2 mg/dL (ref 1.7–2.4)

## 2016-07-23 NOTE — Progress Notes (Signed)
PROGRESS NOTE  [name]:[id number] DOB: 1965/09/29 DOA: 07/19/2016 PCP: Pearson Grippe, MD  Brief History:  51 year old male with a history of systolic and diastolic CHF with repeated hospitalizations due to poor medication and diet compliance, persistent atrial fibrillation, hypertension, GERD presented with increasing shortness of breath and lower extremity edema with associated weight gain. The patient has a well-documented history of poor compliance with diet and his medications. He was recently discharged from the hospital after a stay from 06/22/2016 through 06/26/2016 for CHF exacerbation. He was discharged with Bumex 2 mg twice a day. The patient states that he would only take his Bumex and carvedilol once daily on numerous occasions.  Pt was started on IV lasix which has had to be titrated up for good appropriate clinical response.  Assessment/Plan: Acute on chronic systolic and diastolic CHF -06/22/2016 echo--EF 30-35%, diffuse HK -Increase furosemide to 80 mg IV twice a day--patient continues to appear clinically fluid overloaded --daily weights Neg 24 lbs since admission -review of medical records shows his dry weight ~ 283-284 lbs -accurate I/O's -fluid restrict -NEG 17.4 L for the admission although question accuracy of intake measurement -07/22/16--pt eating [employer] in his room -Continue carvedilol, Aldactone, lisinopril -06/14/15 cath--normal coronary arteries with pulmonary hypertension   Persistent A Fib -continue rivaroxaban -rate controlled  CKD stage II -Baseline creatinine 1.0-1.3 -Am BMP  Hypertension -Improving with diuresis -Continue carvedilol, furosemide, lisinopril, spironolactone  Hyperlipidemia -Continue statin  COPD -Continue Pulmicort  Medical Noncompliance -Pt has [employer] in room 1/31 -discussed compliance issues   Disposition Plan:   Home in 2-3 days  Family Communication:  No Family at  bedside   Consultants:  none  Code Status:  FULL  DVT Prophylaxis:  Xarelto   Procedures: As Listed in Progress Note Above  Antibiotics: None    Subjective: Patient denies fevers, chills, headache, chest pain, dyspnea, nausea, vomiting, diarrhea, abdominal pain, dysuria, hematuria, hematochezia, and melena.   Objective: Vitals:   07/23/16 0500 07/23/16 0548 07/23/16 0827 07/23/16 1407  BP:  (!) 127/95  122/77  Pulse:  84  72  Resp:  20  17  Temp:  97.6 F (36.4 C)  97.5 F (36.4 C)  TempSrc:  Oral  Oral  SpO2:  96% 95% 98%  Weight: 131.1 kg (289 lb 1.6 oz)     Height:        Intake/Output Summary (Last 24 hours) at 07/23/16 1738 Last data filed at 07/23/16 1300  Gross per 24 hour  Intake              720 ml  Output             5900 ml  Net            -5180 ml   Weight change: -3.221 kg (-7 lb 1.6 oz) Exam:   General:  Pt is alert, follows commands appropriately, not in acute distress  HEENT: No icterus, No thrush, No neck mass, [place]/AT  Cardiovascular: IRRR, S1/S2, no rubs, no gallops, +JVD  Respiratory: CTA bilaterally, no wheezing, no crackles, no rhonchi  Abdomen: Soft/+BS, non tender, non distended, no guarding  Extremities: 2 + LE edema, No lymphangitis, No petechiae, No rashes, no synovitis   Data Reviewed: I have personally reviewed following labs and imaging studies Basic Metabolic Panel:  Recent Labs Lab 07/19/16 0753 07/20/16 0703 07/21/16 0525 07/22/16 0552 07/23/16 0449  NA 140 140 139 139 139  K  3.5 3.4* 3.5 3.7 3.7  CL 110 105 102 104 103  CO2 24 27 28 27 28   GLUCOSE 91 92 86 92 90  BUN 18 19 22* 23* 25*  CREATININE 1.00 1.14 1.25* 1.19 1.16  CALCIUM 8.7* 8.5* 8.6* 8.6* 8.6*  MG  --   --   --   --  2.0   Liver Function Tests: No results for input(s): AST, ALT, ALKPHOS, BILITOT, PROT, ALBUMIN in the last 168 hours. No results for input(s): LIPASE, AMYLASE in the last 168 hours. No results for input(s): AMMONIA in  the last 168 hours. Coagulation Profile: No results for input(s): INR, PROTIME in the last 168 hours. CBC:  Recent Labs Lab 07/19/16 0753  WBC 4.3  HGB 10.2*  HCT 32.8*  MCV 79.8  PLT 221   Cardiac Enzymes:  Recent Labs Lab 07/19/16 0753  TROPONINI 0.03*   BNP: Invalid input(s): POCBNP CBG: No results for input(s): GLUCAP in the last 168 hours. HbA1C: No results for input(s): HGBA1C in the last 72 hours. Urine analysis:    Component Value Date/Time   COLORURINE YELLOW 05/01/2016 2104   APPEARANCEUR CLEAR 05/01/2016 2104   LABSPEC 1.025 05/01/2016 2104   PHURINE 6.0 05/01/2016 2104   GLUCOSEU NEGATIVE 05/01/2016 2104   GLUCOSEU NEG mg/dL 69/62/9528 4132   HGBUR NEGATIVE 05/01/2016 2104   BILIRUBINUR NEGATIVE 05/01/2016 2104   KETONESUR NEGATIVE 05/01/2016 2104   PROTEINUR 100 (A) 05/01/2016 2104   UROBILINOGEN 0.2 08/08/2014 1445   NITRITE NEGATIVE 05/01/2016 2104   LEUKOCYTESUR NEGATIVE 05/01/2016 2104   Sepsis Labs: @LABRCNTIP (procalcitonin:4,lacticidven:4) )No results found for this or any previous visit (from the past 240 hour(s)).   Scheduled Meds: . atorvastatin  40 mg Oral q1800  . budesonide (PULMICORT) nebulizer solution  0.25 mg Nebulization BID  . carvedilol  25 mg Oral BID WC  . docusate sodium  100 mg Oral BID  . ferrous gluconate  324 mg Oral BID WC  . furosemide  80 mg Intravenous Q12H  . lisinopril  5 mg Oral Daily  . pantoprazole  40 mg Oral Q0600  . potassium chloride SA  40 mEq Oral Daily  . rivaroxaban  20 mg Oral Q breakfast  . sodium chloride flush  3 mL Intravenous Q12H  . spironolactone  50 mg Oral Daily  . tamsulosin  0.4 mg Oral Daily   Continuous Infusions:  Procedures/Studies: Dg Chest 2 View  Result Date: 07/19/2016 CLINICAL DATA:  Shortness of breath for 2 days EXAM: CHEST  2 VIEW COMPARISON:  07/07/2016 FINDINGS: Cardiomegaly with vascular congestion. No overt edema. No confluent opacities or effusions. IMPRESSION:  Cardiomegaly, vascular congestion. Electronically Signed   By: Charlett Nose M.D.   On: 07/19/2016 08:37   Dg Chest 2 View  Result Date: 07/05/2016 CLINICAL DATA:  Shortness of breath and swelling in the legs. History of CHF on Lasix. EXAM: CHEST  2 VIEW COMPARISON:  06/22/2016 FINDINGS: Cardiac enlargement. Mild fluid in the fissures. No pulmonary vascular congestion. No significant edema. No focal airspace disease or consolidation in the lungs. No blunting of costophrenic angles. No pneumothorax. Mild degenerative changes in the spine. IMPRESSION: Cardiac enlargement with fluid in the fissures. No evidence of significant vascular congestion. Electronically Signed   By: Burman Nieves M.D.   On: 07/05/2016 01:50    Christain Mcraney, DO  [facility] Pager [phone]  If 7PM-7AM, please contact night-coverage [email or web address] Password [email or web address] 07/23/2016, 5:38 PM   LOS: 4 days

## 2016-07-24 DIAGNOSIS — I481 Persistent atrial fibrillation: Secondary | ICD-10-CM

## 2016-07-24 LAB — BASIC METABOLIC PANEL WITH GFR
Anion gap: 8 (ref 5–15)
BUN: 26 mg/dL — ABNORMAL HIGH (ref 6–20)
CO2: 29 mmol/L (ref 22–32)
Calcium: 8.8 mg/dL — ABNORMAL LOW (ref 8.9–10.3)
Chloride: 102 mmol/L (ref 101–111)
Creatinine, Ser: 1.23 mg/dL (ref 0.61–1.24)
GFR calc Af Amer: >60 mL/min
GFR calc non Af Amer: >60 mL/min
Glucose, Bld: 82 mg/dL (ref 65–99)
Potassium: 3.7 mmol/L (ref 3.5–5.1)
Sodium: 139 mmol/L (ref 135–145)

## 2016-07-24 MED ORDER — BUMETANIDE 1 MG PO TABS
2.0000 mg | ORAL_TABLET | Freq: Two times a day (BID) | ORAL | Status: DC
  Administered 2016-07-24 – 2016-07-25 (×2): 2 mg via ORAL
  Filled 2016-07-24 (×2): qty 2

## 2016-07-24 NOTE — Progress Notes (Signed)
PROGRESS NOTE  [name] OVA:[id number] DOB: 1965/08/08 DOA: 07/19/2016 PCP: Pearson Grippe, MD  Brief History: 51 year old male with a history of systolic and diastolic CHF with repeated hospitalizations due to poor medication and diet compliance, persistent atrial fibrillation, hypertension, GERD presented with increasing shortness of breath and lower extremity edema with associated weight gain. The patient has a well-documented history of poor compliance with diet and his medications. He was recently discharged from the hospital after a stay from 06/22/2016 through 06/26/2016 for CHF exacerbation. He was discharged with Bumex 2 mg twice a day. The patient states that he would only take his Bumex and carvedilol once daily on numerous occasions. Pt was started on IV lasix which has had to be titrated up for good appropriate clinical response.  Assessment/Plan: Acute on chronic systolic and diastolic CHF -06/22/2016 echo--EF 30-35%, diffuse HK -Increase furosemide to 80 mg IV twice a day-->switch to po bumex this pm --daily weights Neg 23 lbs since admission -review of medical records shows his dry weight ~ 283-284 lbs -accurate I/O's -fluid restrict -NEG 22.7L for the admission although question accuracy of intake measurement -07/22/16--pt eating [employer] in his room -Continue carvedilol, Aldactone, lisinopril -06/14/15 cath--normal coronary arteries with pulmonary hypertension   Persistent A Fib -continuerivaroxaban -rate controlled  CKD stage II -Baseline creatinine 1.0-1.3 -Am BMP  Hypertension -Improving with diuresis -Continue carvedilol, furosemide, lisinopril, spironolactone  Hyperlipidemia -Continue statin  COPD -Continue Pulmicort  Medical Noncompliance -Pt has [employer] in room 1/31 -discussed compliance issues   Disposition Plan: Home 07/25/16 if stable Family Communication: NoFamily at bedside   Consultants: none  Code  Status: FULL  DVT Prophylaxis: Xarelto   Procedures: As Listed in Progress Note Above  Antibiotics: None    Subjective: Patient denies fevers, chills, headache, chest pain, dyspnea, nausea, vomiting, diarrhea, abdominal pain, dysuria, hematuria, hematochezia, and melena.   Objective: Vitals:   07/24/16 0500 07/24/16 0751 07/24/16 0835 07/24/16 1458  BP: (!) 136/92  (!) 127/91 100/60  Pulse: 65  75 63  Resp: 18   18  Temp: 97.6 F (36.4 C)   98.3 F (36.8 C)  TempSrc: Oral   Oral  SpO2: 99% 100% 96% 99%  Weight: 131.8 kg (290 lb 9.1 oz)     Height:        Intake/Output Summary (Last 24 hours) at 07/24/16 1528 Last data filed at 07/24/16 1417  Gross per 24 hour  Intake              240 ml  Output             5475 ml  Net            -5235 ml   Weight change: 0.665 kg (1 lb 7.5 oz) Exam:   General:  Pt is alert, follows commands appropriately, not in acute distress  HEENT: No icterus, No thrush, No neck mass, [place]/AT  Cardiovascular: RRR, S1/S2, no rubs, no gallops  Respiratory: bibasilar crackles, no wheeze  Abdomen: Soft/+BS, non tender, non distended, no guarding  Extremities: trace LE edema, No lymphangitis, No petechiae, No rashes, no synovitis   Data Reviewed: I have personally reviewed following labs and imaging studies Basic Metabolic Panel:  Recent Labs Lab 07/20/16 0703 07/21/16 0525 07/22/16 0552 07/23/16 0449 07/24/16 0455  NA 140 139 139 139 139  K 3.4* 3.5 3.7 3.7 3.7  CL 105 102 104 103 102  CO2 27 28 27  28  29  GLUCOSE 92 86 92 90 82  BUN 19 22* 23* 25* 26*  CREATININE 1.14 1.25* 1.19 1.16 1.23  CALCIUM 8.5* 8.6* 8.6* 8.6* 8.8*  MG  --   --   --  2.0  --    Liver Function Tests: No results for input(s): AST, ALT, ALKPHOS, BILITOT, PROT, ALBUMIN in the last 168 hours. No results for input(s): LIPASE, AMYLASE in the last 168 hours. No results for input(s): AMMONIA in the last 168 hours. Coagulation Profile: No results for  input(s): INR, PROTIME in the last 168 hours. CBC:  Recent Labs Lab 07/19/16 0753  WBC 4.3  HGB 10.2*  HCT 32.8*  MCV 79.8  PLT 221   Cardiac Enzymes:  Recent Labs Lab 07/19/16 0753  TROPONINI 0.03*   BNP: Invalid input(s): POCBNP CBG: No results for input(s): GLUCAP in the last 168 hours. HbA1C: No results for input(s): HGBA1C in the last 72 hours. Urine analysis:    Component Value Date/Time   COLORURINE YELLOW 05/01/2016 2104   APPEARANCEUR CLEAR 05/01/2016 2104   LABSPEC 1.025 05/01/2016 2104   PHURINE 6.0 05/01/2016 2104   GLUCOSEU NEGATIVE 05/01/2016 2104   GLUCOSEU NEG mg/dL 16/03/9603 5409   HGBUR NEGATIVE 05/01/2016 2104   BILIRUBINUR NEGATIVE 05/01/2016 2104   KETONESUR NEGATIVE 05/01/2016 2104   PROTEINUR 100 (A) 05/01/2016 2104   UROBILINOGEN 0.2 08/08/2014 1445   NITRITE NEGATIVE 05/01/2016 2104   LEUKOCYTESUR NEGATIVE 05/01/2016 2104   Sepsis Labs: @LABRCNTIP (procalcitonin:4,lacticidven:4) )No results found for this or any previous visit (from the past 240 hour(s)).   Scheduled Meds: . atorvastatin  40 mg Oral q1800  . budesonide (PULMICORT) nebulizer solution  0.25 mg Nebulization BID  . bumetanide  2 mg Oral BID  . carvedilol  25 mg Oral BID WC  . docusate sodium  100 mg Oral BID  . ferrous gluconate  324 mg Oral BID WC  . lisinopril  5 mg Oral Daily  . pantoprazole  40 mg Oral Q0600  . potassium chloride SA  40 mEq Oral Daily  . rivaroxaban  20 mg Oral Q breakfast  . sodium chloride flush  3 mL Intravenous Q12H  . spironolactone  50 mg Oral Daily  . tamsulosin  0.4 mg Oral Daily   Continuous Infusions:  Procedures/Studies: Dg Chest 2 View  Result Date: 07/19/2016 CLINICAL DATA:  Shortness of breath for 2 days EXAM: CHEST  2 VIEW COMPARISON:  07/07/2016 FINDINGS: Cardiomegaly with vascular congestion. No overt edema. No confluent opacities or effusions. IMPRESSION: Cardiomegaly, vascular congestion. Electronically Signed   By: Charlett Nose M.D.   On: 07/19/2016 08:37   Dg Chest 2 View  Result Date: 07/05/2016 CLINICAL DATA:  Shortness of breath and swelling in the legs. History of CHF on Lasix. EXAM: CHEST  2 VIEW COMPARISON:  06/22/2016 FINDINGS: Cardiac enlargement. Mild fluid in the fissures. No pulmonary vascular congestion. No significant edema. No focal airspace disease or consolidation in the lungs. No blunting of costophrenic angles. No pneumothorax. Mild degenerative changes in the spine. IMPRESSION: Cardiac enlargement with fluid in the fissures. No evidence of significant vascular congestion. Electronically Signed   By: Burman Nieves M.D.   On: 07/05/2016 01:50    Adlynn Lowenstein, DO  [facility] Pager [phone]  If 7PM-7AM, please contact night-coverage [email or web address] Password [email or web address] 07/24/2016, 3:28 PM   LOS: 5 days

## 2016-07-24 NOTE — Discharge Summary (Signed)
Physician Discharge Summary  [name] ZOX:[id number] DOB: 08/12/1965 DOA: 07/19/2016  PCP: Pearson Grippe, MD  Admit date: 07/19/2016 Discharge date: 07/25/16  Admitted From: Home Disposition:  Home  Recommendations for Outpatient Follow-up:  1. Follow up with PCP in 1-2 weeks 2. Please obtain BMP/CBC in one week   Home Health: No Equipment/Devices:None  Discharge Condition: Stable CODE STATUS:FULL Diet recommendation: Heart Healthy    Brief/Interim Summary: 51 year old male with a history of systolic and diastolic CHF with repeated hospitalizations due to poor medication and diet compliance, persistent atrial fibrillation, hypertension, GERD presented with increasing shortness of breath and lower extremity edema with associated weight gain. The patient has a well-documented history of poor compliance with diet and his medications. He was recently discharged from the hospital after a stay from 06/22/2016 through 06/26/2016 for CHF exacerbation. He was discharged with Bumex 2 mg twice a day. The patient states that he would only take his Bumex and carvedilol once daily on numerous occasions. Pt was started on IV lasix which has had to be titrated up for good appropriate clinical response.   Discharge Diagnoses:  Acute on chronic systolic and diastolic CHF -06/22/2016 echo--EF 30-35%, diffuse HK -Increase furosemide to 80 mg IV twice a day-->switch to po bumex- 2/2->home with bumex 2 mg po bid (pt was on bumex prior to admission) -daily weights Neg 23lbs since admission -review of medical records shows his dry weight ~ 283-284 lbs -discharge weight listed not accurate -accurate I/O's -fluid restrict -NEG 24L for the admission although question accuracy of intake measurement -07/22/16--pt eating [employer] in his room -Continue carvedilol, Aldactone, lisinopril -06/14/15 cath--normal coronary arteries with pulmonary hypertension   Persistent A Fib -continuerivaroxaban -rate  controlled  CKD stage II -Baseline creatinine 1.0-1.3 -Am BMP  Hypertension -Improved with diuresis -Continue carvedilol, furosemide, lisinopril, spironolactone  Hyperlipidemia -Continue statin  COPD -Continue Pulmicort-->home with prior to admission dose of Qvar  Medical Noncompliance -Pt has [employer] in room 1/31 -discussed compliance issues  Hyperlipidemia -continue statin       Discharge Instructions  Discharge Instructions    Diet - low sodium heart healthy    Complete by:  As directed    Increase activity slowly    Complete by:  As directed      Allergies as of 07/25/2016      Reactions   Bee Venom Shortness Of Breath, Swelling, Other (See Comments)   Reaction:  Facial swelling   Food Shortness Of Breath, Rash, Other (See Comments)   Pt states that he is allergic to strawberries.     Lipitor [atorvastatin] Shortness Of Breath, Other (See Comments)   Reaction:  Nose bleeds    Aspirin Other (See Comments)   Reaction:  GI upset    Banana Diarrhea   Metolazone Other (See Comments)   Patient states that he stopped taking due to "heart attack" like symptoms   Oatmeal Nausea And Vomiting   Per pt, allergic to oatmeal.   Orange Juice [orange Oil] Nausea And Vomiting   Torsemide Swelling, Other (See Comments)   Reaction:  Swelling of feet/legs    Diltiazem Palpitations   Hydralazine Palpitations      Medication List    STOP taking these medications   furosemide 40 MG tablet Commonly known as:  LASIX     TAKE these medications   atorvastatin 40 MG tablet Commonly known as:  LIPITOR Take 1 tablet (40 mg total) by mouth daily at 6 PM.   beclomethasone 80 MCG/ACT inhaler  Commonly known as:  QVAR Inhale 2 puffs into the lungs 2 (two) times daily as needed (for shortness of breath).   bumetanide 2 MG tablet Commonly known as:  BUMEX Take 1 tablet (2 mg total) by mouth 2 (two) times daily.   carvedilol 25 MG tablet Commonly known as:   COREG Take 1 tablet (25 mg total) by mouth 2 (two) times daily with a meal.   docusate sodium 100 MG capsule Commonly known as:  COLACE Take 1 capsule (100 mg total) by mouth 2 (two) times daily.   ferrous gluconate 324 MG tablet Commonly known as:  FERGON Take 1 tablet (324 mg total) by mouth 2 (two) times daily with a meal.   lisinopril 5 MG tablet Commonly known as:  PRINIVIL,ZESTRIL Take 1 tablet (5 mg total) by mouth daily.   pantoprazole 40 MG tablet Commonly known as:  PROTONIX Take 1 tablet (40 mg total) by mouth daily at 6 (six) AM.   potassium chloride SA 20 MEQ tablet Commonly known as:  K-DUR,KLOR-CON Take 2 tablets (40 mEq total) by mouth daily.   rivaroxaban 20 MG Tabs tablet Commonly known as:  XARELTO Take 1 tablet (20 mg total) by mouth daily with breakfast.   spironolactone 50 MG tablet Commonly known as:  ALDACTONE Take 1 tablet (50 mg total) by mouth daily.   tamsulosin 0.4 MG Caps capsule Commonly known as:  FLOMAX Take 1 capsule (0.4 mg total) by mouth daily.       Allergies  Allergen Reactions  . Bee Venom Shortness Of Breath, Swelling and Other (See Comments)    Reaction:  Facial swelling  . Food Shortness Of Breath, Rash and Other (See Comments)    Pt states that he is allergic to strawberries.    . Lipitor [Atorvastatin] Shortness Of Breath and Other (See Comments)    Reaction:  Nose bleeds   . Aspirin Other (See Comments)    Reaction:  GI upset   . Banana Diarrhea  . Metolazone Other (See Comments)    Patient states that he stopped taking due to "heart attack" like symptoms  . Oatmeal Nausea And Vomiting    Per pt, allergic to oatmeal.  . Orange Juice [Orange Oil] Nausea And Vomiting  . Torsemide Swelling and Other (See Comments)    Reaction:  Swelling of feet/legs   . Diltiazem Palpitations  . Hydralazine Palpitations    Consultations:  none   Procedures/Studies: Dg Chest 2 View  Result Date: 07/19/2016 CLINICAL DATA:   Shortness of breath for 2 days EXAM: CHEST  2 VIEW COMPARISON:  07/07/2016 FINDINGS: Cardiomegaly with vascular congestion. No overt edema. No confluent opacities or effusions. IMPRESSION: Cardiomegaly, vascular congestion. Electronically Signed   By: Charlett Nose M.D.   On: 07/19/2016 08:37   Dg Chest 2 View  Result Date: 07/05/2016 CLINICAL DATA:  Shortness of breath and swelling in the legs. History of CHF on Lasix. EXAM: CHEST  2 VIEW COMPARISON:  06/22/2016 FINDINGS: Cardiac enlargement. Mild fluid in the fissures. No pulmonary vascular congestion. No significant edema. No focal airspace disease or consolidation in the lungs. No blunting of costophrenic angles. No pneumothorax. Mild degenerative changes in the spine. IMPRESSION: Cardiac enlargement with fluid in the fissures. No evidence of significant vascular congestion. Electronically Signed   By: Burman Nieves M.D.   On: 07/05/2016 01:50        Discharge Exam: Vitals:   07/24/16 2020 07/25/16 0601  BP: 109/67 (!) 146/96  Pulse: 66  79  Resp: 20 20  Temp: 98.8 F (37.1 C) 98.3 F (36.8 C)   Vitals:   07/24/16 2021 07/25/16 0500 07/25/16 0601 07/25/16 0817  BP:   (!) 146/96   Pulse:   79   Resp:   20   Temp:   98.3 F (36.8 C)   TempSrc:   Oral   SpO2: 99%  98% 98%  Weight:  124.3 kg (274 lb)    Height:        General: Pt is alert, awake, not in acute distress Cardiovascular: IRRR, S1/S2 +, no rubs, no gallops Respiratory: CTA bilaterally, no wheezing, no rhonchi Abdominal: Soft, NT, ND, bowel sounds + Extremities: trace LE edema, no cyanosis   The results of significant diagnostics from this hospitalization (including imaging, microbiology, ancillary and laboratory) are listed below for reference.    Significant Diagnostic Studies: Dg Chest 2 View  Result Date: 07/19/2016 CLINICAL DATA:  Shortness of breath for 2 days EXAM: CHEST  2 VIEW COMPARISON:  07/07/2016 FINDINGS: Cardiomegaly with vascular congestion.  No overt edema. No confluent opacities or effusions. IMPRESSION: Cardiomegaly, vascular congestion. Electronically Signed   By: Charlett Nose M.D.   On: 07/19/2016 08:37   Dg Chest 2 View  Result Date: 07/05/2016 CLINICAL DATA:  Shortness of breath and swelling in the legs. History of CHF on Lasix. EXAM: CHEST  2 VIEW COMPARISON:  06/22/2016 FINDINGS: Cardiac enlargement. Mild fluid in the fissures. No pulmonary vascular congestion. No significant edema. No focal airspace disease or consolidation in the lungs. No blunting of costophrenic angles. No pneumothorax. Mild degenerative changes in the spine. IMPRESSION: Cardiac enlargement with fluid in the fissures. No evidence of significant vascular congestion. Electronically Signed   By: Burman Nieves M.D.   On: 07/05/2016 01:50     Microbiology: No results found for this or any previous visit (from the past 240 hour(s)).   Labs: Basic Metabolic Panel:  Recent Labs Lab 07/21/16 0525 07/22/16 0552 07/23/16 0449 07/24/16 0455 07/25/16 0641  NA 139 139 139 139 138  K 3.5 3.7 3.7 3.7 3.6  CL 102 104 103 102 102  CO2 28 27 28 29 28   GLUCOSE 86 92 90 82 92  BUN 22* 23* 25* 26* 28*  CREATININE 1.25* 1.19 1.16 1.23 1.41*  CALCIUM 8.6* 8.6* 8.6* 8.8* 9.2  MG  --   --  2.0  --  2.1   Liver Function Tests: No results for input(s): AST, ALT, ALKPHOS, BILITOT, PROT, ALBUMIN in the last 168 hours. No results for input(s): LIPASE, AMYLASE in the last 168 hours. No results for input(s): AMMONIA in the last 168 hours. CBC:  Recent Labs Lab 07/19/16 0753  WBC 4.3  HGB 10.2*  HCT 32.8*  MCV 79.8  PLT 221   Cardiac Enzymes:  Recent Labs Lab 07/19/16 0753  TROPONINI 0.03*   BNP: Invalid input(s): POCBNP CBG: No results for input(s): GLUCAP in the last 168 hours.  Time coordinating discharge:  Greater than 30 minutes  Signed:  Namari Breton, DO [facility] Pager: [phone] 07/25/2016, 9:54 AM

## 2016-07-24 NOTE — Care Management Important Message (Signed)
Important Message  Patient Details  Name: [name] MRN: [id number] Date of Birth: 02-23-1966   Medicare Important Message Given:  Yes    [name], RN 07/24/2016, 12:56 PM

## 2016-07-24 NOTE — Progress Notes (Signed)
Central Telemetry called and stated that pt had 6 beat run of Vtach. Dr. Craige Cotta (mid-level) on call made aware

## 2016-07-25 LAB — MAGNESIUM: Magnesium: 2.1 mg/dL (ref 1.7–2.4)

## 2016-07-25 LAB — BASIC METABOLIC PANEL
Anion gap: 8 (ref 5–15)
BUN: 28 mg/dL — AB (ref 6–20)
CALCIUM: 9.2 mg/dL (ref 8.9–10.3)
CO2: 28 mmol/L (ref 22–32)
Chloride: 102 mmol/L (ref 101–111)
Creatinine, Ser: 1.41 mg/dL — ABNORMAL HIGH (ref 0.61–1.24)
GFR calc Af Amer: >60 mL/min (ref >60)
GFR calc non Af Amer: 58 mL/min — ABNORMAL LOW (ref >60)
Glucose, Bld: 92 mg/dL (ref 65–99)
Potassium: 3.6 mmol/L (ref 3.5–5.1)
Sodium: 138 mmol/L (ref 135–145)

## 2016-07-25 MED ORDER — BUMETANIDE 2 MG PO TABS: 2.0000 mg | ORAL_TABLET | Freq: Two times a day (BID) | ORAL | 1 refills | Status: DC

## 2016-07-25 NOTE — Progress Notes (Signed)
Pt IV and telemetry removed, tolerated well.  Reviewed discharge instructions with pt and family at bedside.  Answered all questions at this time.   

## 2016-07-28 ENCOUNTER — Telehealth: Payer: Self-pay | Admitting: *Deleted

## 2016-07-28 NOTE — Telephone Encounter (Signed)
Elnita Maxwell, RN case mgr w/ [employer] left message on voice mail - patient recently d/c from [facility] on 07/19/16.  Was seen in our office on 07/13/16.  Lasix d/c'd & placed on Bumex 2mg  twice a day.  Stated patient still c/o swelling in his arms, legs, & abdomen.  Has f/u scheduled for 08/05/16 with Herma Carson in [place] office.

## 2016-07-28 NOTE — Telephone Encounter (Signed)
Pt has long h/o noncompliance with medications and diet. See discharge summary. Can take extra 1 mg Bumex for next 3 days in addition to what he is already taking.

## 2016-07-30 NOTE — Telephone Encounter (Signed)
No answer on patient phone.  Left information below on voice mail of RN case manager.

## 2016-08-05 ENCOUNTER — Ambulatory Visit: Payer: Medicare Other | Admitting: Physician Assistant

## 2016-08-12 ENCOUNTER — Ambulatory Visit: Payer: Medicare Other | Admitting: Physician Assistant

## 2016-08-12 ENCOUNTER — Encounter: Payer: Self-pay | Admitting: Physician Assistant

## 2016-08-12 NOTE — Progress Notes (Deleted)
Cardiology Office Note    Date:  08/12/2016   ID:  [name], DOB [birth year], MRN [id number]  PCP:  Pearson Grippe, MD  Cardiologist: Dr. Purvis Sheffield  No chief complaint on file.   History of Present Illness:  [name] is a 51 y.o. male with history of chronic combined systolic and diastolic CHF, nonischemic cardiomyopathy LVEF 20-25% with normal coronary arteries 05/2015, persistent atrial fibrillation on Xarelto unclear whether he takes it, hypertension, long history of noncompliance.  Patient had 2 admissions in January with CHF exacerbation. 2-D echo 06/22/16 LVEF 30-35%. He was discharged on Bumex 2 mg twice a day. Dry weight is around 284 pounds. He diuresed 24 L in the hospital. Found to be eating [employer] in his room. Admission weight 313 pounds Discharge weight 274 pounds. Discharge creatinine 1.41      Past Medical History:  Diagnosis Date  . Allergic rhinitis   . Asthma   . Atrial fibrillation (HCC)   . CHF (congestive heart failure) (HCC)   . Essential hypertension   . Gastroesophageal reflux disease   . Heart attack   . History of cardiac catheterization    Normal coronaries December 2016  . Noncompliance    Major problem leading to declining health and recurrent hospitalization  . Nonischemic cardiomyopathy (HCC)    LVEF 20-25%  . OSA (obstructive sleep apnea)   . Osteoarthritis   . Peptic ulcer disease     Past Surgical History:  Procedure Laterality Date  . CARDIAC CATHETERIZATION N/A 06/14/2015   Procedure: Right/Left Heart Cath and Coronary Angiography;  Surgeon: Runell Gess, MD;  [facility];  Service: Cardiovascular;  Laterality: N/A;    Current Medications: Outpatient Medications Prior to Visit  Medication Sig Dispense Refill  . atorvastatin (LIPITOR) 40 MG tablet Take 1 tablet (40 mg total) by mouth daily at 6 PM. 30 tablet 0  . beclomethasone (QVAR) 80 MCG/ACT inhaler Inhale 2 puffs into the lungs 2 (two) times daily  as needed (for shortness of breath).    . bumetanide (BUMEX) 2 MG tablet Take 1 tablet (2 mg total) by mouth 2 (two) times daily. 60 tablet 1  . carvedilol (COREG) 25 MG tablet Take 1 tablet (25 mg total) by mouth 2 (two) times daily with a meal. 60 tablet 1  . docusate sodium (COLACE) 100 MG capsule Take 1 capsule (100 mg total) by mouth 2 (two) times daily. 10 capsule 0  . ferrous gluconate (FERGON) 324 MG tablet Take 1 tablet (324 mg total) by mouth 2 (two) times daily with a meal. 60 tablet 0  . lisinopril (PRINIVIL,ZESTRIL) 5 MG tablet Take 1 tablet (5 mg total) by mouth daily. 30 tablet 0  . pantoprazole (PROTONIX) 40 MG tablet Take 1 tablet (40 mg total) by mouth daily at 6 (six) AM. 30 tablet 0  . potassium chloride SA (K-DUR,KLOR-CON) 20 MEQ tablet Take 2 tablets (40 mEq total) by mouth daily. 30 tablet 0  . rivaroxaban (XARELTO) 20 MG TABS tablet Take 1 tablet (20 mg total) by mouth daily with breakfast. 30 tablet 0  . spironolactone (ALDACTONE) 50 MG tablet Take 1 tablet (50 mg total) by mouth daily. 30 tablet 0  . tamsulosin (FLOMAX) 0.4 MG CAPS capsule Take 1 capsule (0.4 mg total) by mouth daily. 30 capsule 0   No facility-administered medications prior to visit.      Allergies:   Bee venom; Food; Lipitor [atorvastatin]; Aspirin; Banana; Metolazone; Oatmeal; Orange juice [  orange oil]; Torsemide; Diltiazem; and Hydralazine   Social History   Social History  . Marital status: Divorced    Spouse name: N/A  . Number of children: 3  . Years of education: N/A   Occupational History  . disabled    Social History Main Topics  . Smoking status: Former Smoker    Packs/day: 0.50    Years: 20.00    Types: Cigarettes    Start date: 04/26/1988    Quit date: 06/23/2007  . Smokeless tobacco: Never Used     Comment: 1 ppd former smoker  . Alcohol use No     Comment: No etoh since 2009  . Drug use: No  . Sexual activity: No   Other Topics Concern  . Not on file   Social History  Narrative  . No narrative on file     Family History:  The patient's ***family history includes Aneurysm in his mother; Heart attack in his father; Hypertension in his sister; Stroke in his father.   ROS:   Please see the history of present illness.    ROS All other systems reviewed and are negative.   PHYSICAL EXAM:   VS:  There were no vitals taken for this visit.  Physical Exam  GEN: Well nourished, well developed, in no acute distress  HEENT: normal  Neck: no JVD, carotid bruits, or masses Cardiac:RRR; no murmurs, rubs, or gallops  Respiratory:  clear to auscultation bilaterally, normal work of breathing GI: soft, nontender, nondistended, + BS Ext: without cyanosis, clubbing, or edema, Good distal pulses bilaterally MS: no deformity or atrophy  Skin: warm and dry, no rash Neuro:  Alert and Oriented x 3, Strength and sensation are intact Psych: euthymic mood, full affect  Wt Readings from Last 3 Encounters:  07/25/16 274 lb (124.3 kg)  07/13/16 (!) 305 lb 12.8 oz (138.7 kg)  07/05/16 300 lb (136.1 kg)      Studies/Labs Reviewed:   EKG:  EKG is*** ordered today.  The ekg ordered today demonstrates ***  Recent Labs: 04/04/2016: TSH 1.240 06/23/2016: ALT 16 07/19/2016: B Natriuretic Peptide 808.0; Hemoglobin 10.2; Platelets 221 07/25/2016: BUN 28; Creatinine, Ser 1.41; Magnesium 2.1; Potassium 3.6; Sodium 138   Lipid Panel    Component Value Date/Time   CHOL 139 06/14/2015 0328   TRIG 58 06/14/2015 0328   HDL 28 (L) 06/14/2015 0328   CHOLHDL 5.0 06/14/2015 0328   VLDL 12 06/14/2015 0328   LDLCALC 99 06/14/2015 0328    Additional studies/ records that were reviewed today include:  2-D echo 1/1/18Study Conclusions   - Left ventricle: The cavity size was normal. Wall thickness was   increased in a pattern of moderate to severe LVH. Systolic   function was moderately to severely reduced. The estimated   ejection fraction was in the range of 30% to 35%. Moderate    diffuse hypokinesis. Regional wall motion abnormalities cannot be   excluded. - Aortic root: The aortic root was mildly dilated. - Mitral valve: There was mild to moderate regurgitation. - Left atrium: The atrium was severely dilated. - Right atrium: The atrium was severely dilated.    Study Conclusions   - Left ventricle: The cavity size was normal. Wall thickness was   increased in a pattern of moderate to severe LVH. Systolic   function was moderately to severely reduced. The estimated   ejection fraction was in the range of 30% to 35%. Moderate   diffuse hypokinesis. Regional wall motion abnormalities cannot  be   excluded. - Aortic root: The aortic root was mildly dilated. - Mitral valve: There was mild to moderate regurgitation. - Left atrium: The atrium was severely dilated. - Right atrium: The atrium was severely dilated.   Echocardiogram 09/30/2015: Study Conclusions   - Left ventricle: The cavity size was normal. There was severe   concentric hypertrophy. Systolic function was severely reduced.   The estimated ejection fraction was in the range of 20% to 25%.   Diffuse hypokinesis. The study is not technically sufficient to   allow evaluation of LV diastolic function. - Mitral valve: Mildly thickened leaflets . There was trivial   regurgitation. - Left atrium: Moderately dilated at 47 ml/m2. - Right ventricle: The cavity size was mildly dilated. Systolic   function was normal. - Right atrium: Severely dilated at 29 cm2. - Tricuspid valve: There was mild regurgitation. - Pulmonary arteries: PA peak pressure: 49 mm Hg (S). - Inferior vena cava: The vessel was dilated. The respirophasic   diameter changes were blunted (< 50%), consistent with elevated   central venous pressure.   Impressions:   - Compared to a prior study in 05/2015, the LVEF has declined to   20-25% with global hypokinesis. A-fib is noted to be present.    ASSESSMENT:    1. Acute on chronic  combined systolic and diastolic heart failure (HCC)   2. Essential hypertension   3. Nonischemic cardiomyopathy (HCC)   4. CKD (chronic kidney disease), stage II   5. Noncompliance with diet and medication regimen      PLAN:  In order of problems listed above:      Medication Adjustments/Labs and Tests Ordered: Current medicines are reviewed at length with the patient today.  Concerns regarding medicines are outlined above.  Medication changes, Labs and Tests ordered today are listed in the Patient Instructions below. There are no Patient Instructions on file for this visit.   Elson Clan, PA-C  08/12/2016 11:11 AM    [facility] [address] [place], [place], [place]  [address] Phone: [phone]; Fax: [phone]

## 2016-08-13 ENCOUNTER — Encounter (HOSPITAL_COMMUNITY): Payer: Self-pay

## 2016-08-13 ENCOUNTER — Emergency Department (HOSPITAL_COMMUNITY)
Admission: EM | Admit: 2016-08-13 | Discharge: 2016-08-14 | Disposition: A | Discharge status: Home / Self Care | Payer: Medicare Other | Attending: Emergency Medicine | Admitting: Emergency Medicine

## 2016-08-13 ENCOUNTER — Ambulatory Visit: Payer: Medicare Other | Admitting: Adult Health

## 2016-08-13 ENCOUNTER — Emergency Department (HOSPITAL_COMMUNITY): Payer: Medicare Other

## 2016-08-13 DIAGNOSIS — Z87891 Personal history of nicotine dependence: Secondary | ICD-10-CM | POA: Insufficient documentation

## 2016-08-13 DIAGNOSIS — I509 Heart failure, unspecified: Secondary | ICD-10-CM

## 2016-08-13 DIAGNOSIS — R0602 Shortness of breath: Secondary | ICD-10-CM | POA: Diagnosis present

## 2016-08-13 DIAGNOSIS — Z79899 Other long term (current) drug therapy: Secondary | ICD-10-CM | POA: Insufficient documentation

## 2016-08-13 DIAGNOSIS — N182 Chronic kidney disease, stage 2 (mild): Secondary | ICD-10-CM | POA: Insufficient documentation

## 2016-08-13 DIAGNOSIS — J45909 Unspecified asthma, uncomplicated: Secondary | ICD-10-CM | POA: Insufficient documentation

## 2016-08-13 DIAGNOSIS — I130 Hypertensive heart and chronic kidney disease with heart failure and stage 1 through stage 4 chronic kidney disease, or unspecified chronic kidney disease: Secondary | ICD-10-CM | POA: Insufficient documentation

## 2016-08-13 DIAGNOSIS — I2510 Atherosclerotic heart disease of native coronary artery without angina pectoris: Secondary | ICD-10-CM | POA: Diagnosis not present

## 2016-08-13 DIAGNOSIS — I5043 Acute on chronic combined systolic (congestive) and diastolic (congestive) heart failure: Secondary | ICD-10-CM | POA: Insufficient documentation

## 2016-08-13 LAB — CBC WITH DIFFERENTIAL/PLATELET
BASOS ABS: 0 10*3/uL (ref 0.0–0.1)
Basophils Relative: 0 %
EOS PCT: 2 %
Eosinophils Absolute: 0.1 10*3/uL (ref 0.0–0.7)
HEMATOCRIT: 32.5 % — AB (ref 39.0–52.0)
Hemoglobin: 10.3 g/dL — ABNORMAL LOW (ref 13.0–17.0)
LYMPHS PCT: 25 %
Lymphs Abs: 1 10*3/uL (ref 0.7–4.0)
MCH: 25.4 pg — ABNORMAL LOW (ref 26.0–34.0)
MCHC: 31.7 g/dL (ref 30.0–36.0)
MCV: 80 fL (ref 78.0–100.0)
MONO ABS: 0.3 10*3/uL (ref 0.1–1.0)
MONOS PCT: 7 %
NEUTROS ABS: 2.6 10*3/uL (ref 1.7–7.7)
Neutrophils Relative %: 66 %
PLATELETS: 202 10*3/uL (ref 150–400)
RBC: 4.06 MIL/uL — ABNORMAL LOW (ref 4.22–5.81)
RDW: 17.4 % — AB (ref 11.5–15.5)
WBC: 3.9 10*3/uL — ABNORMAL LOW (ref 4.0–10.5)

## 2016-08-13 MED ORDER — FUROSEMIDE 10 MG/ML IJ SOLN
80.0000 mg | Freq: Once | INTRAMUSCULAR | Status: AC
  Administered 2016-08-13: 80 mg via INTRAVENOUS
  Filled 2016-08-13: qty 8

## 2016-08-13 MED ORDER — ALBUTEROL SULFATE (2.5 MG/3ML) 0.083% IN NEBU
5.0000 mg | INHALATION_SOLUTION | Freq: Once | RESPIRATORY_TRACT | Status: AC
  Administered 2016-08-13: 5 mg via RESPIRATORY_TRACT
  Filled 2016-08-13: qty 6

## 2016-08-13 MED ORDER — NITROGLYCERIN 2 % TD OINT
1.0000 [in_us] | TOPICAL_OINTMENT | Freq: Four times a day (QID) | TRANSDERMAL | Status: DC
  Administered 2016-08-13: 1 [in_us] via TOPICAL
  Filled 2016-08-13: qty 1

## 2016-08-13 NOTE — ED Provider Notes (Signed)
[facility]-EMERGENCY DEPT Provider Note   CSN: [id number] Arrival date & time: 08/13/16  2236   By signing my name below, I, Bobbie Stack, attest that this documentation has been prepared under the direction and in the presence of Devoria Albe, MD. Electronically Signed: Bobbie Stack, Scribe. 08/13/16. 1:13 AM.'  Time seen 23:33 PM  History   Chief Complaint Chief Complaint  Patient presents with  . Shortness of Breath    The history is provided by the patient. No language interpreter was used.  HPI Comments: [name] is a 51 y.o. male brought in by ambulance, who presents to the Emergency Department complaining of SOB for the past two days. He states that the his SOB worsens upon exertion. He reports minimal leg swelling, abdominal distension, chest tightness, wheezing, and nausea. He also repots that he has been forgetful lately. He states that he has been forgetting things such as what day it is. He also reports that he gets a headache on occasion but he doesn't have one currently. He denies vomiting or diarrhea, abdominal pain or headache. He denies smoking or drinking. He states that he has been taking demadex for a couple of days. He currently uses a nebulizer x 2 daily.  PCP Pearson Grippe, MD Cardiology Dr Darl Householder  Past Medical History:  Diagnosis Date  . Allergic rhinitis   . Asthma   . Atrial fibrillation (HCC)   . CHF (congestive heart failure) (HCC)   . Essential hypertension   . Gastroesophageal reflux disease   . Heart attack   . History of cardiac catheterization    Normal coronaries December 2016  . Noncompliance    Major problem leading to declining health and recurrent hospitalization  . Nonischemic cardiomyopathy (HCC)    LVEF 20-25%  . OSA (obstructive sleep apnea)   . Osteoarthritis   . Peptic ulcer disease     Patient Active Problem List   Diagnosis Date Noted  . Acute on chronic congestive heart failure (HCC) 07/19/2016  . Acute on chronic  combined systolic and diastolic CHF (congestive heart failure) (HCC) 07/19/2016  . CKD (chronic kidney disease), stage II 05/17/2016  . Coronary arteries, normal Dec 2016 01/23/2016  . Chronic anticoagulation-Xarelto 01/23/2016  . NSVT (nonsustained ventricular tachycardia) (HCC) 01/23/2016  . Elevated troponin 12/21/2015  . Nonischemic cardiomyopathy (HCC)   . Morbid obesity due to excess calories (HCC)   . Noncompliance with diet and medication regimen 12/02/2015  . Acute on chronic systolic congestive heart failure (HCC)   . Acute on chronic combined systolic and diastolic heart failure (HCC) 10/08/2015  . OSA (obstructive sleep apnea) 09/20/2015  . Pulmonary hypertension 09/19/2015  . Normocytic anemia 09/19/2015  . Chronic atrial fibrillation (HCC)   . Dyspnea on exertion 05/28/2015  . Acute respiratory failure with hypoxia (HCC) 04/01/2015  . Hypokalemia 04/01/2015  . Obesity hypoventilation syndrome (HCC) 08/08/2014  . GERD (gastroesophageal reflux disease) 08/08/2014  . Atrial fibrillation (HCC) 06/28/2014  . Edema of right lower extremity 06/21/2014  . Fecal urgency 06/21/2014  . Asthma 02/11/2009  . Hyperlipidemia 07/12/2008  . OBESITY, MORBID 07/12/2008  . Anxiety state 07/12/2008  . DEPRESSION 07/12/2008  . Essential hypertension 07/12/2008  . ALLERGIC RHINITIS 07/12/2008  . Peptic ulcer 07/12/2008  . OSTEOARTHRITIS 07/12/2008    Past Surgical History:  Procedure Laterality Date  . CARDIAC CATHETERIZATION N/A 06/14/2015   Procedure: Right/Left Heart Cath and Coronary Angiography;  Surgeon: Runell Gess, MD;  Location: [facility] INVASIVE CV LAB;  Service: Cardiovascular;  Laterality: N/A;       Home Medications    Prior to Admission medications   Medication Sig Start Date End Date Taking? Authorizing Provider  atorvastatin (LIPITOR) 40 MG tablet Take 1 tablet (40 mg total) by mouth daily at 6 PM. 06/25/16  Yes Kathlen Mody, MD  beclomethasone (QVAR) 80 MCG/ACT  inhaler Inhale 2 puffs into the lungs 2 (two) times daily as needed (for shortness of breath).   Yes Historical Provider, MD  carvedilol (COREG) 25 MG tablet Take 1 tablet (25 mg total) by mouth 2 (two) times daily with a meal. 06/25/16  Yes Kathlen Mody, MD  docusate sodium (COLACE) 100 MG capsule Take 1 capsule (100 mg total) by mouth 2 (two) times daily. 06/25/16  Yes Kathlen Mody, MD  ferrous gluconate (FERGON) 324 MG tablet Take 1 tablet (324 mg total) by mouth 2 (two) times daily with a meal. 06/25/16  Yes Kathlen Mody, MD  lisinopril (PRINIVIL,ZESTRIL) 5 MG tablet Take 1 tablet (5 mg total) by mouth daily. 06/25/16  Yes Kathlen Mody, MD  OXYGEN Inhale 3 L into the lungs daily.   Yes Historical Provider, MD  pantoprazole (PROTONIX) 40 MG tablet Take 1 tablet (40 mg total) by mouth daily at 6 (six) AM. 06/26/16  Yes Kathlen Mody, MD  potassium chloride SA (K-DUR,KLOR-CON) 20 MEQ tablet Take 2 tablets (40 mEq total) by mouth daily. Patient taking differently: Take 20 mEq by mouth 2 (two) times daily.  06/25/16  Yes Kathlen Mody, MD  rivaroxaban (XARELTO) 20 MG TABS tablet Take 1 tablet (20 mg total) by mouth daily with breakfast. Patient taking differently: Take 20 mg by mouth daily.  06/26/16  Yes Kathlen Mody, MD  spironolactone (ALDACTONE) 50 MG tablet Take 1 tablet (50 mg total) by mouth daily. 06/25/16  Yes Kathlen Mody, MD  tamsulosin (FLOMAX) 0.4 MG CAPS capsule Take 1 capsule (0.4 mg total) by mouth daily. 06/25/16  Yes Kathlen Mody, MD  torsemide (DEMADEX) 20 MG tablet Take 20 mg by mouth 2 (two) times daily. 08/07/16  Yes Historical Provider, MD  bumetanide (BUMEX) 2 MG tablet Take 1 tablet (2 mg total) by mouth 2 (two) times daily. Patient not taking: Reported on 08/13/2016 07/25/16   Catarina Hartshorn, MD    Family History Family History  Problem Relation Age of Onset  . Stroke Father   . Heart attack Father   . Aneurysm Mother     Cerebral aneurysm  . Hypertension Sister   . Colon cancer Neg Hx   .  Inflammatory bowel disease Neg Hx   . Liver disease Neg Hx     Social History Social History  Substance Use Topics  . Smoking status: Former Smoker    Packs/day: 0.50    Years: 20.00    Types: Cigarettes    Start date: 04/26/1988    Quit date: 06/23/2007  . Smokeless tobacco: Never Used     Comment: 1 ppd former smoker  . Alcohol use No     Comment: No etoh since 2009  on disability   Allergies   Bee venom; Food; Lipitor [atorvastatin]; Aspirin; Banana; Metolazone; Oatmeal; Orange juice [orange oil]; Torsemide; Diltiazem; and Hydralazine   Review of Systems Review of Systems  Respiratory: Positive for chest tightness and wheezing.   Cardiovascular: Positive for leg swelling.  Gastrointestinal: Positive for abdominal distention and nausea. Negative for vomiting.  All other systems reviewed and are negative.    Physical Exam Updated Vital Signs BP (!) 172/130  Pulse 85   Temp 98.7 F (37.1 C) (Oral)   Resp 21   Ht 6' (1.829 m)   Wt 300 lb (136.1 kg)   SpO2 94%   BMI 40.69 kg/m   Vital signs normal Except hypertension   Physical Exam  Constitutional: He is oriented to person, place, and time. He appears well-developed and well-nourished.  Non-toxic appearance. He does not appear ill. No distress.  Pt has lost a lot of weight.   HENT:  Head: Normocephalic and atraumatic.  Right Ear: External ear normal.  Left Ear: External ear normal.  Nose: Nose normal. No mucosal edema or rhinorrhea.  Mouth/Throat: Oropharynx is clear and moist and mucous membranes are normal. No dental abscesses or uvula swelling.  Eyes: Conjunctivae and EOM are normal. Pupils are equal, round, and reactive to light.  Neck: Normal range of motion and full passive range of motion without pain. Neck supple.  Cardiovascular: Normal rate, regular rhythm and normal heart sounds.  Exam reveals no gallop and no friction rub.   No murmur heard. Pulmonary/Chest: Effort normal. No respiratory  distress. He has wheezes. He has no rhonchi. He has no rales. He exhibits no tenderness and no crepitus.  Diffused scattered wheezes.   Abdominal: Soft. Normal appearance and bowel sounds are normal. He exhibits no distension. There is no tenderness. There is no rebound and no guarding.  Musculoskeletal: Normal range of motion. He exhibits edema. He exhibits no tenderness.  Moves all extremities well. Thickening of the skin at lower extremities with minimal trace edema. Much improved over prior visits.   Neurological: He is alert and oriented to person, place, and time. He has normal strength. No cranial nerve deficit.  Skin: Skin is warm, dry and intact. No rash noted. No erythema. No pallor.  Psychiatric: He has a normal mood and affect. His speech is normal and behavior is normal. His mood appears not anxious.  Nursing note and vitals reviewed.    ED Treatments / Results  DIAGNOSTIC STUDIES: Oxygen Saturation is 98% on RA, normal by my interpretation.      Labs (all labs ordered are listed, but only abnormal results are displayed) Results for orders placed or performed during the hospital encounter of 08/13/16  Basic metabolic panel  Result Value Ref Range   Sodium 139 135 - 145 mmol/L   Potassium 3.6 3.5 - 5.1 mmol/L   Chloride 107 101 - 111 mmol/L   CO2 25 22 - 32 mmol/L   Glucose, Bld 88 65 - 99 mg/dL   BUN 17 6 - 20 mg/dL   Creatinine, Ser 1.61 0.61 - 1.24 mg/dL   Calcium 8.9 8.9 - 09.6 mg/dL   GFR calc non Af Amer >60 >60 mL/min   GFR calc Af Amer >60 >60 mL/min   Anion gap 7 5 - 15  CBC with Differential  Result Value Ref Range   WBC 3.9 (L) 4.0 - 10.5 K/uL   RBC 4.06 (L) 4.22 - 5.81 MIL/uL   Hemoglobin 10.3 (L) 13.0 - 17.0 g/dL   HCT 04.5 (L) 40.9 - 81.1 %   MCV 80.0 78.0 - 100.0 fL   MCH 25.4 (L) 26.0 - 34.0 pg   MCHC 31.7 30.0 - 36.0 g/dL   RDW 91.4 (H) 78.2 - 95.6 %   Platelets 202 150 - 400 K/uL   Neutrophils Relative % 66 %   Neutro Abs 2.6 1.7 - 7.7 K/uL     Lymphocytes Relative 25 %   Lymphs  Abs 1.0 0.7 - 4.0 K/uL   Monocytes Relative 7 %   Monocytes Absolute 0.3 0.1 - 1.0 K/uL   Eosinophils Relative 2 %   Eosinophils Absolute 0.1 0.0 - 0.7 K/uL   Basophils Relative 0 %   Basophils Absolute 0.0 0.0 - 0.1 K/uL  Brain natriuretic peptide  Result Value Ref Range   B Natriuretic Peptide 863.0 (H) 0.0 - 100.0 pg/mL  Troponin I  Result Value Ref Range   Troponin I 0.03 (HH) <0.03 ng/mL  Troponin I  Result Value Ref Range   Troponin I 0.03 (HH) <0.03 ng/mL   Laboratory interpretation all normal except Negative delta troponin, stable elevation of BNP, stable anemia    EKG  EKG Interpretation  Date/Time:  Thursday August 13 2016 22:42:39 EST Ventricular Rate:  86 PR Interval:    QRS Duration: 93 QT Interval:  378 QTC Calculation: 453 R Axis:   62 Text Interpretation:  Atrial fibrillation Borderline repolarization abnormality Since last tracing rate slower 19 Jul 2016 Confirmed by Tabathia Knoche  MD-I, Malachi Suderman ([id number]) on 08/13/2016 11:41:36 PM       Radiology Dg Chest Port 1 View  Result Date: 08/14/2016 CLINICAL DATA:  Shortness of breath EXAM: PORTABLE CHEST 1 VIEW COMPARISON:  Chest radiograph 08/05/2016 FINDINGS: Cardiomediastinal silhouette remains massively enlarged. There is mild pulmonary edema and small bilateral pleural effusions. IMPRESSION: Congestive heart failure with mild pulmonary edema, small pleural effusions and cardiomegaly versus pericardial effusion. Electronically Signed   By: Deatra Robinson M.D.   On: 08/14/2016 00:19    Procedures Procedures (including critical care time)  Medications Ordered in ED Medications  nitroGLYCERIN (NITROGLYN) 2 % ointment 1 inch (1 inch Topical Given 08/13/16 2351)  albuterol (PROVENTIL) (2.5 MG/3ML) 0.083% nebulizer solution 5 mg (5 mg Nebulization Given 08/13/16 2356)  furosemide (LASIX) injection 80 mg (80 mg Intravenous Given 08/13/16 2351)     Initial Impression / Assessment and  Plan / ED Course  I have reviewed the triage vital signs and the nursing notes.  Pertinent labs & imaging results that were available during my care of the patient were reviewed by me and considered in my medical decision making (see chart for details).   Patient was given an albuterol nebulizer. He was given Lasix 60 mg IV. He had nitroglycerin paste placed on his chest.  Patient had over 4 L of urinary output. Patient states he felt unsteady on his feet however he did not have any focal weakness. We discussed this would not be a reason to be admitted to the hospital. Patient is a frequent ED visitor. He comes at least once or twice a month to get rid of excess fluid. There was some concern about him being noncompliant with his medication at home.  Final Clinical Impressions(s) / ED Diagnoses   Final diagnoses:  Acute on chronic congestive heart failure, unspecified congestive heart failure type [facility])    Plan discharge  Devoria Albe, MD, FACEP  I personally performed the services described in this documentation, which was scribed in my presence. The recorded information has been reviewed and considered.  Devoria Albe, MD, Concha Pyo, MD 08/14/16 [phone]

## 2016-08-13 NOTE — ED Triage Notes (Signed)
Pt reports increased sob over past several days with some chest tightness, states he has recently changed some of his fluid pills and has not followed up with his pmd since then. Pt has increased dry cough

## 2016-08-14 DIAGNOSIS — I130 Hypertensive heart and chronic kidney disease with heart failure and stage 1 through stage 4 chronic kidney disease, or unspecified chronic kidney disease: Secondary | ICD-10-CM | POA: Diagnosis not present

## 2016-08-14 LAB — BASIC METABOLIC PANEL
Anion gap: 7 (ref 5–15)
BUN: 17 mg/dL (ref 6–20)
CALCIUM: 8.9 mg/dL (ref 8.9–10.3)
CO2: 25 mmol/L (ref 22–32)
CREATININE: 1.03 mg/dL (ref 0.61–1.24)
Chloride: 107 mmol/L (ref 101–111)
GFR calc non Af Amer: >60 mL/min (ref >60)
Glucose, Bld: 88 mg/dL (ref 65–99)
Potassium: 3.6 mmol/L (ref 3.5–5.1)
SODIUM: 139 mmol/L (ref 135–145)

## 2016-08-14 LAB — TROPONIN I
TROPONIN I: 0.03 ng/mL — AB (ref <0.03)
Troponin I: 0.03 ng/mL (ref <0.03)

## 2016-08-14 LAB — BRAIN NATRIURETIC PEPTIDE: B Natriuretic Peptide: 863 pg/mL — ABNORMAL HIGH (ref 0.0–100.0)

## 2016-08-14 NOTE — ED Notes (Signed)
CRITICAL VALUE ALERT  Critical value received:  Troponin 0.03  Date of notification:  08/14/2016  Time of notification:  0034  Critical value read back:Yes.    Nurse who received alert:  Neldon Mc RN  MD notified (1st page):  Dr. Lynelle Doctor  Time of first page:  0042  MD notified (2nd page):  Time of second page:  Responding MD:    Time MD responded:

## 2016-08-14 NOTE — Discharge Instructions (Signed)
Take your medications as prescribed. Recheck as needed.  °

## 2016-08-14 NOTE — ED Notes (Signed)
Pt requesting to stay, stating his gait is not that steady; pt given graham crackers upon request and Dr. Lynelle Doctor informed of pt's request to be admitted

## 2016-08-17 ENCOUNTER — Ambulatory Visit (INDEPENDENT_AMBULATORY_CARE_PROVIDER_SITE_OTHER): Payer: Medicare Other | Admitting: Cardiovascular Disease

## 2016-08-17 ENCOUNTER — Encounter: Payer: Self-pay | Admitting: Cardiovascular Disease

## 2016-08-17 VITALS — BP 159/115 | HR 106 | Ht 72.0 in | Wt 318.0 lb

## 2016-08-17 DIAGNOSIS — Z9111 Patient's noncompliance with dietary regimen: Secondary | ICD-10-CM | POA: Diagnosis not present

## 2016-08-17 DIAGNOSIS — I519 Heart disease, unspecified: Secondary | ICD-10-CM

## 2016-08-17 DIAGNOSIS — I482 Chronic atrial fibrillation, unspecified: Secondary | ICD-10-CM

## 2016-08-17 DIAGNOSIS — I10 Essential (primary) hypertension: Secondary | ICD-10-CM | POA: Diagnosis not present

## 2016-08-17 DIAGNOSIS — Z9289 Personal history of other medical treatment: Secondary | ICD-10-CM

## 2016-08-17 DIAGNOSIS — I5043 Acute on chronic combined systolic (congestive) and diastolic (congestive) heart failure: Secondary | ICD-10-CM

## 2016-08-17 DIAGNOSIS — Z9114 Patient's other noncompliance with medication regimen: Secondary | ICD-10-CM

## 2016-08-17 DIAGNOSIS — I5189 Other ill-defined heart diseases: Secondary | ICD-10-CM

## 2016-08-17 DIAGNOSIS — Z91148 Patient's other noncompliance with medication regimen for other reason: Secondary | ICD-10-CM

## 2016-08-17 NOTE — Progress Notes (Signed)
SUBJECTIVE: The patient presents for follow-up of chronic combined systolic and diastolic heart failure. He also has chronic atrial fibrillation.  He is notoriously noncompliant with medications and dietary restrictions.  Wt today 318 lbs (305 lbs 07/13/16).  He continues to take carvedilol, Bumex, and torsemide once daily rather than as prescribed (twice daily). He says the diuretics make his legs swell. He is hypertensive and tachycardic today. His legs are markedly edematous. He thinks he should stop by the emergency room for IV Lasix today.   Review of Systems: As per "subjective", otherwise negative.  Allergies  Allergen Reactions  . Bee Venom Shortness Of Breath, Swelling and Other (See Comments)    Reaction:  Facial swelling  . Food Shortness Of Breath, Rash and Other (See Comments)    Pt states that he is allergic to strawberries.    . Lipitor [Atorvastatin] Shortness Of Breath and Other (See Comments)    Reaction:  Nose bleeds   . Aspirin Other (See Comments)    Reaction:  GI upset   . Banana Diarrhea  . Metolazone Other (See Comments)    Patient states that he stopped taking due to "heart attack" like symptoms  . Oatmeal Nausea And Vomiting    Per pt, allergic to oatmeal.  . Orange Juice [Orange Oil] Nausea And Vomiting  . Torsemide Swelling and Other (See Comments)    Reaction:  Swelling of feet/legs   . Diltiazem Palpitations  . Hydralazine Palpitations    Current Outpatient Prescriptions  Medication Sig Dispense Refill  . atorvastatin (LIPITOR) 40 MG tablet Take 1 tablet (40 mg total) by mouth daily at 6 PM. 30 tablet 0  . beclomethasone (QVAR) 80 MCG/ACT inhaler Inhale 2 puffs into the lungs 2 (two) times daily as needed (for shortness of breath).    . bumetanide (BUMEX) 2 MG tablet Take 1 tablet (2 mg total) by mouth 2 (two) times daily. 60 tablet 1  . carvedilol (COREG) 25 MG tablet Take 1 tablet (25 mg total) by mouth 2 (two) times daily with a meal. 60  tablet 1  . docusate sodium (COLACE) 100 MG capsule Take 1 capsule (100 mg total) by mouth 2 (two) times daily. 10 capsule 0  . ferrous gluconate (FERGON) 324 MG tablet Take 1 tablet (324 mg total) by mouth 2 (two) times daily with a meal. 60 tablet 0  . lisinopril (PRINIVIL,ZESTRIL) 5 MG tablet Take 1 tablet (5 mg total) by mouth daily. 30 tablet 0  . OXYGEN Inhale 3 L into the lungs daily.    . pantoprazole (PROTONIX) 40 MG tablet Take 1 tablet (40 mg total) by mouth daily at 6 (six) AM. 30 tablet 0  . potassium chloride SA (K-DUR,KLOR-CON) 20 MEQ tablet Take 2 tablets (40 mEq total) by mouth daily. (Patient taking differently: Take 20 mEq by mouth 2 (two) times daily. ) 30 tablet 0  . rivaroxaban (XARELTO) 20 MG TABS tablet Take 1 tablet (20 mg total) by mouth daily with breakfast. (Patient taking differently: Take 20 mg by mouth daily. ) 30 tablet 0  . spironolactone (ALDACTONE) 50 MG tablet Take 1 tablet (50 mg total) by mouth daily. 30 tablet 0  . tamsulosin (FLOMAX) 0.4 MG CAPS capsule Take 1 capsule (0.4 mg total) by mouth daily. 30 capsule 0  . torsemide (DEMADEX) 20 MG tablet Take 20 mg by mouth 2 (two) times daily.  3   No current facility-administered medications for this visit.  Past Medical History:  Diagnosis Date  . Allergic rhinitis   . Asthma   . Atrial fibrillation (HCC)   . CHF (congestive heart failure) (HCC)   . Essential hypertension   . Gastroesophageal reflux disease   . Heart attack   . History of cardiac catheterization    Normal coronaries December 2016  . Noncompliance    Major problem leading to declining health and recurrent hospitalization  . Nonischemic cardiomyopathy (HCC)    LVEF 20-25%  . OSA (obstructive sleep apnea)   . Osteoarthritis   . Peptic ulcer disease     Past Surgical History:  Procedure Laterality Date  . CARDIAC CATHETERIZATION N/A 06/14/2015   Procedure: Right/Left Heart Cath and Coronary Angiography;  Surgeon: Runell Gess, MD;  Location: [facility] INVASIVE CV LAB;  Service: Cardiovascular;  Laterality: N/A;    Social History   Social History  . Marital status: Divorced    Spouse name: N/A  . Number of children: 3  . Years of education: N/A   Occupational History  . disabled    Social History Main Topics  . Smoking status: Former Smoker    Packs/day: 0.50    Years: 20.00    Types: Cigarettes    Start date: 04/26/1988    Quit date: 06/23/2007  . Smokeless tobacco: Never Used     Comment: 1 ppd former smoker  . Alcohol use No     Comment: No etoh since 2009  . Drug use: No  . Sexual activity: No   Other Topics Concern  . Not on file   Social History Narrative  . No narrative on file     Vitals:   08/17/16 1306  BP: (!) 159/115  Pulse: (!) 106  SpO2: 91%  Weight: (!) 318 lb (144.2 kg)  Height: 6' (1.829 m)    PHYSICAL EXAM General: NAD, wearing sunglasses. HEENT: Normal. Neck: No JVD, no thyromegaly. Lungs: Faint bibasilar rales. CV: Nondisplaced PMI. Tachycardic, irregular rhythm, normal S1/S2, no S3, no murmur. 2+ tense pretibial edema b/l with chronic venous stasis dermatitis. Abdomen: Obese.  Neurologic: Alert and oriented.  Psych: Normal affect. Musculoskeletal: No gross deformities.     ECG: Most recent ECG reviewed.      ASSESSMENT AND PLAN: 1. Acute on chronic combined systolic and diastolic heart failure: Encouraged to take carvedilol twice daily and Bumex twice daily as prescribed.  2. Nonischemic cardiomyopathy, LVEF 20-25%: Normal coronary arteries in 05/2015.  3. Persistent atrial fibrillation: Prescribed Xarelto. Unclear that he takes it. Encouraged to take Coreg as prescribed to control heart rate.  4. Essential HTN: Markedly elevated. Noncompliant with Rx. Encouraged to take Coreg as prescribed.  5. Medication/treatment noncompliance: Repeatedly educated regarding health hazards which did not change his habits.   Prentice Docker, M.D.,  F.A.C.C.

## 2016-08-17 NOTE — Patient Instructions (Signed)
Your physician wants you to follow-up in:  1 year with Dr Koneswaran You will receive a reminder letter in the mail two months in advance. If you don't receive a letter, please call our office to schedule the follow-up appointment.    Your physician recommends that you continue on your current medications as directed. Please refer to the Current Medication list given to you today.    If you need a refill on your cardiac medications before your next appointment, please call your pharmacy.     Thank you for choosing [facility] !        

## 2016-08-18 ENCOUNTER — Encounter (HOSPITAL_COMMUNITY): Payer: Self-pay | Admitting: *Deleted

## 2016-08-18 ENCOUNTER — Emergency Department (HOSPITAL_COMMUNITY): Payer: Medicare Other

## 2016-08-18 ENCOUNTER — Inpatient Hospital Stay (HOSPITAL_COMMUNITY)
Admission: EM | Admit: 2016-08-18 | Discharge: 2016-08-21 | DRG: 291 | Disposition: A | Discharge status: Home / Self Care | Payer: Medicare Other | Attending: Family Medicine | Admitting: Family Medicine

## 2016-08-18 DIAGNOSIS — Z9111 Patient's noncompliance with dietary regimen: Secondary | ICD-10-CM

## 2016-08-18 DIAGNOSIS — Z91148 Patient's other noncompliance with medication regimen for other reason: Secondary | ICD-10-CM

## 2016-08-18 DIAGNOSIS — I10 Essential (primary) hypertension: Secondary | ICD-10-CM | POA: Diagnosis present

## 2016-08-18 DIAGNOSIS — Z87891 Personal history of nicotine dependence: Secondary | ICD-10-CM

## 2016-08-18 DIAGNOSIS — Z7951 Long term (current) use of inhaled steroids: Secondary | ICD-10-CM

## 2016-08-18 DIAGNOSIS — K219 Gastro-esophageal reflux disease without esophagitis: Secondary | ICD-10-CM | POA: Diagnosis present

## 2016-08-18 DIAGNOSIS — I252 Old myocardial infarction: Secondary | ICD-10-CM

## 2016-08-18 DIAGNOSIS — Z9119 Patient's noncompliance with other medical treatment and regimen: Secondary | ICD-10-CM

## 2016-08-18 DIAGNOSIS — J45909 Unspecified asthma, uncomplicated: Secondary | ICD-10-CM | POA: Diagnosis present

## 2016-08-18 DIAGNOSIS — I481 Persistent atrial fibrillation: Secondary | ICD-10-CM | POA: Diagnosis present

## 2016-08-18 DIAGNOSIS — E662 Morbid (severe) obesity with alveolar hypoventilation: Secondary | ICD-10-CM | POA: Diagnosis present

## 2016-08-18 DIAGNOSIS — Z91030 Bee allergy status: Secondary | ICD-10-CM

## 2016-08-18 DIAGNOSIS — I130 Hypertensive heart and chronic kidney disease with heart failure and stage 1 through stage 4 chronic kidney disease, or unspecified chronic kidney disease: Principal | ICD-10-CM | POA: Diagnosis present

## 2016-08-18 DIAGNOSIS — Z91018 Allergy to other foods: Secondary | ICD-10-CM

## 2016-08-18 DIAGNOSIS — I4891 Unspecified atrial fibrillation: Secondary | ICD-10-CM | POA: Diagnosis present

## 2016-08-18 DIAGNOSIS — Z886 Allergy status to analgesic agent status: Secondary | ICD-10-CM

## 2016-08-18 DIAGNOSIS — Z8249 Family history of ischemic heart disease and other diseases of the circulatory system: Secondary | ICD-10-CM

## 2016-08-18 DIAGNOSIS — Z7901 Long term (current) use of anticoagulants: Secondary | ICD-10-CM

## 2016-08-18 DIAGNOSIS — Z8711 Personal history of peptic ulcer disease: Secondary | ICD-10-CM

## 2016-08-18 DIAGNOSIS — E785 Hyperlipidemia, unspecified: Secondary | ICD-10-CM | POA: Diagnosis present

## 2016-08-18 DIAGNOSIS — I4581 Long QT syndrome: Secondary | ICD-10-CM | POA: Diagnosis present

## 2016-08-18 DIAGNOSIS — Z888 Allergy status to other drugs, medicaments and biological substances status: Secondary | ICD-10-CM

## 2016-08-18 DIAGNOSIS — Z9114 Patient's other noncompliance with medication regimen: Secondary | ICD-10-CM

## 2016-08-18 DIAGNOSIS — I5043 Acute on chronic combined systolic (congestive) and diastolic (congestive) heart failure: Secondary | ICD-10-CM | POA: Diagnosis present

## 2016-08-18 DIAGNOSIS — I5023 Acute on chronic systolic (congestive) heart failure: Secondary | ICD-10-CM | POA: Diagnosis present

## 2016-08-18 DIAGNOSIS — I255 Ischemic cardiomyopathy: Secondary | ICD-10-CM | POA: Diagnosis present

## 2016-08-18 DIAGNOSIS — G4733 Obstructive sleep apnea (adult) (pediatric): Secondary | ICD-10-CM | POA: Diagnosis present

## 2016-08-18 DIAGNOSIS — R0602 Shortness of breath: Secondary | ICD-10-CM | POA: Diagnosis not present

## 2016-08-18 DIAGNOSIS — Z79899 Other long term (current) drug therapy: Secondary | ICD-10-CM

## 2016-08-18 DIAGNOSIS — Z9981 Dependence on supplemental oxygen: Secondary | ICD-10-CM

## 2016-08-18 DIAGNOSIS — N182 Chronic kidney disease, stage 2 (mild): Secondary | ICD-10-CM | POA: Diagnosis present

## 2016-08-18 DIAGNOSIS — Z6839 Body mass index (BMI) 39.0-39.9, adult: Secondary | ICD-10-CM

## 2016-08-18 DIAGNOSIS — Z823 Family history of stroke: Secondary | ICD-10-CM

## 2016-08-18 DIAGNOSIS — I472 Ventricular tachycardia: Secondary | ICD-10-CM | POA: Diagnosis present

## 2016-08-18 LAB — BASIC METABOLIC PANEL
ANION GAP: 6 (ref 5–15)
BUN: 19 mg/dL (ref 6–20)
CO2: 25 mmol/L (ref 22–32)
Calcium: 8.5 mg/dL — ABNORMAL LOW (ref 8.9–10.3)
Chloride: 106 mmol/L (ref 101–111)
Creatinine, Ser: 1.07 mg/dL (ref 0.61–1.24)
GFR calc Af Amer: >60 mL/min (ref >60)
GLUCOSE: 92 mg/dL (ref 65–99)
Potassium: 3.4 mmol/L — ABNORMAL LOW (ref 3.5–5.1)
Sodium: 137 mmol/L (ref 135–145)

## 2016-08-18 LAB — CBC WITH DIFFERENTIAL/PLATELET
BASOS ABS: 0 10*3/uL (ref 0.0–0.1)
Basophils Relative: 1 %
EOS PCT: 2 %
Eosinophils Absolute: 0.1 10*3/uL (ref 0.0–0.7)
HEMATOCRIT: 32.1 % — AB (ref 39.0–52.0)
Hemoglobin: 10 g/dL — ABNORMAL LOW (ref 13.0–17.0)
LYMPHS PCT: 20 %
Lymphs Abs: 0.9 10*3/uL (ref 0.7–4.0)
MCH: 25.1 pg — ABNORMAL LOW (ref 26.0–34.0)
MCHC: 31.2 g/dL (ref 30.0–36.0)
MCV: 80.7 fL (ref 78.0–100.0)
Monocytes Absolute: 0.3 10*3/uL (ref 0.1–1.0)
Monocytes Relative: 7 %
NEUTROS ABS: 3.1 10*3/uL (ref 1.7–7.7)
Neutrophils Relative %: 70 %
PLATELETS: 223 10*3/uL (ref 150–400)
RBC: 3.98 MIL/uL — AB (ref 4.22–5.81)
RDW: 17.6 % — ABNORMAL HIGH (ref 11.5–15.5)
WBC: 4.4 10*3/uL (ref 4.0–10.5)

## 2016-08-18 LAB — BLOOD GAS, VENOUS
ACID-BASE EXCESS: 0.1 mmol/L (ref 0.0–2.0)
BICARBONATE: 23.8 mmol/L (ref 20.0–28.0)
O2 Content: 4 L/min
O2 SAT: 70.5 %
pCO2, Ven: 48.7 mmHg (ref 44.0–60.0)
pH, Ven: 7.335 (ref 7.250–7.430)
pO2, Ven: 46.3 mmHg — ABNORMAL HIGH (ref 32.0–45.0)

## 2016-08-18 LAB — PROTIME-INR
INR: 1.33
PROTHROMBIN TIME: 16.5 s — AB (ref 11.4–15.2)

## 2016-08-18 LAB — TROPONIN I: Troponin I: 0.03 ng/mL (ref <0.03)

## 2016-08-18 LAB — BRAIN NATRIURETIC PEPTIDE: B NATRIURETIC PEPTIDE 5: 1083 pg/mL — AB (ref 0.0–100.0)

## 2016-08-18 MED ORDER — FUROSEMIDE 10 MG/ML IJ SOLN
60.0000 mg | Freq: Once | INTRAMUSCULAR | Status: AC
  Administered 2016-08-18: 60 mg via INTRAVENOUS
  Filled 2016-08-18: qty 6

## 2016-08-18 MED ORDER — IPRATROPIUM-ALBUTEROL 0.5-2.5 (3) MG/3ML IN SOLN
3.0000 mL | Freq: Once | RESPIRATORY_TRACT | Status: AC
  Administered 2016-08-18: 3 mL via RESPIRATORY_TRACT
  Filled 2016-08-18: qty 3

## 2016-08-18 NOTE — ED Notes (Addendum)
CRITICAL VALUE ALERT  Critical value received:  Troponin 0.03  Date of notification:  08/18/2016  Time of notification:  2343  Critical value read back: Yes  Nurse who received alert:  Georgann Housekeeper  MD notified (1st page):  Dr. Manus Gunning  Time of first page:  2344

## 2016-08-18 NOTE — ED Provider Notes (Signed)
[facility]-EMERGENCY DEPT Provider Note   CSN: [id number] Arrival date & time: 08/18/16  2215   By signing my name below, I, Bobbie Stack, attest that this documentation has been prepared under the direction and in the presence of Glynn Octave, MD. Electronically Signed: Bobbie Stack, Scribe. 08/18/16. 11:17 PM. History   Chief Complaint Chief Complaint  Patient presents with  . Shortness of Breath    The history is provided by the patient. No language interpreter was used.  HPI Comments: [name] is a 51 y.o. male brought in by ambulance, who presents to the Emergency Department complaining of SOB for the past 24 hours. The patient was seen by his cardiologist yesterday and was told to come to the ED for evaluation. The patient didn't come to the ED yesterday because he thought that his symptoms might resolve. The patient reports associated centralized CP that is currently present. He describes this pain as dull and achy. He also reports leg swelling and abdominal distension. He states that he has been compliant with his lasix medications and of his other medications. The patient is currently on oxygen at home when needed. He states that he doesn't use it much. He is currently on Xarelto.   Past Medical History:  Diagnosis Date  . Allergic rhinitis   . Asthma   . Atrial fibrillation (HCC)   . CHF (congestive heart failure) (HCC)   . Essential hypertension   . Gastroesophageal reflux disease   . Heart attack   . History of cardiac catheterization    Normal coronaries December 2016  . Noncompliance    Major problem leading to declining health and recurrent hospitalization  . Nonischemic cardiomyopathy (HCC)    LVEF 20-25%  . OSA (obstructive sleep apnea)   . Osteoarthritis   . Peptic ulcer disease     Patient Active Problem List   Diagnosis Date Noted  . Acute on chronic systolic CHF (congestive heart failure) (HCC) 08/19/2016  . Acute on chronic congestive heart  failure (HCC) 07/19/2016  . Acute on chronic combined systolic and diastolic CHF (congestive heart failure) (HCC) 07/19/2016  . CKD (chronic kidney disease), stage II 05/17/2016  . Coronary arteries, normal Dec 2016 01/23/2016  . Chronic anticoagulation-Xarelto 01/23/2016  . NSVT (nonsustained ventricular tachycardia) (HCC) 01/23/2016  . Elevated troponin 12/21/2015  . Nonischemic cardiomyopathy (HCC)   . Morbid obesity due to excess calories (HCC)   . Noncompliance with diet and medication regimen 12/02/2015  . Acute on chronic systolic congestive heart failure (HCC)   . Acute on chronic combined systolic and diastolic heart failure (HCC) 10/08/2015  . OSA (obstructive sleep apnea) 09/20/2015  . Pulmonary hypertension 09/19/2015  . Normocytic anemia 09/19/2015  . Chronic atrial fibrillation (HCC)   . Dyspnea on exertion 05/28/2015  . Acute respiratory failure with hypoxia (HCC) 04/01/2015  . Hypokalemia 04/01/2015  . Obesity hypoventilation syndrome (HCC) 08/08/2014  . GERD (gastroesophageal reflux disease) 08/08/2014  . Atrial fibrillation (HCC) 06/28/2014  . Edema of right lower extremity 06/21/2014  . Fecal urgency 06/21/2014  . Asthma 02/11/2009  . Hyperlipidemia 07/12/2008  . OBESITY, MORBID 07/12/2008  . Anxiety state 07/12/2008  . DEPRESSION 07/12/2008  . Essential hypertension 07/12/2008  . ALLERGIC RHINITIS 07/12/2008  . Peptic ulcer 07/12/2008  . OSTEOARTHRITIS 07/12/2008    Past Surgical History:  Procedure Laterality Date  . CARDIAC CATHETERIZATION N/A 06/14/2015   Procedure: Right/Left Heart Cath and Coronary Angiography;  Surgeon: Runell Gess, MD;  Location: [facility] INVASIVE CV LAB;  Service: Cardiovascular;  Laterality: N/A;       Home Medications    Prior to Admission medications   Medication Sig Start Date End Date Taking? Authorizing Provider  atorvastatin (LIPITOR) 40 MG tablet Take 1 tablet (40 mg total) by mouth daily at 6 PM. 06/25/16  Yes Kathlen Mody, MD  beclomethasone (QVAR) 80 MCG/ACT inhaler Inhale 2 puffs into the lungs 2 (two) times daily as needed (for shortness of breath).   Yes Historical Provider, MD  bumetanide (BUMEX) 2 MG tablet Take 1 tablet (2 mg total) by mouth 2 (two) times daily. 07/25/16  Yes Catarina Hartshorn, MD  carvedilol (COREG) 25 MG tablet Take 1 tablet (25 mg total) by mouth 2 (two) times daily with a meal. 06/25/16  Yes Kathlen Mody, MD  docusate sodium (COLACE) 100 MG capsule Take 1 capsule (100 mg total) by mouth 2 (two) times daily. 06/25/16  Yes Kathlen Mody, MD  ferrous gluconate (FERGON) 324 MG tablet Take 1 tablet (324 mg total) by mouth 2 (two) times daily with a meal. 06/25/16  Yes Kathlen Mody, MD  lisinopril (PRINIVIL,ZESTRIL) 5 MG tablet Take 1 tablet (5 mg total) by mouth daily. 06/25/16  Yes Kathlen Mody, MD  OXYGEN Inhale 3 L into the lungs daily.   Yes Historical Provider, MD  pantoprazole (PROTONIX) 40 MG tablet Take 1 tablet (40 mg total) by mouth daily at 6 (six) AM. 06/26/16  Yes Kathlen Mody, MD  potassium chloride SA (K-DUR,KLOR-CON) 20 MEQ tablet Take 2 tablets (40 mEq total) by mouth daily. Patient taking differently: Take 20 mEq by mouth 2 (two) times daily.  06/25/16  Yes Kathlen Mody, MD  rivaroxaban (XARELTO) 20 MG TABS tablet Take 1 tablet (20 mg total) by mouth daily with breakfast. Patient taking differently: Take 20 mg by mouth daily.  06/26/16  Yes Kathlen Mody, MD  spironolactone (ALDACTONE) 50 MG tablet Take 1 tablet (50 mg total) by mouth daily. 06/25/16  Yes Kathlen Mody, MD  tamsulosin (FLOMAX) 0.4 MG CAPS capsule Take 1 capsule (0.4 mg total) by mouth daily. 06/25/16  Yes Kathlen Mody, MD  torsemide (DEMADEX) 20 MG tablet Take 20 mg by mouth 2 (two) times daily. 08/07/16  Yes Historical Provider, MD    Family History Family History  Problem Relation Age of Onset  . Stroke Father   . Heart attack Father   . Aneurysm Mother     Cerebral aneurysm  . Hypertension Sister   . Colon cancer Neg Hx     . Inflammatory bowel disease Neg Hx   . Liver disease Neg Hx     Social History Social History  Substance Use Topics  . Smoking status: Former Smoker    Packs/day: 0.50    Years: 20.00    Types: Cigarettes    Start date: 04/26/1988    Quit date: 06/23/2007  . Smokeless tobacco: Never Used     Comment: 1 ppd former smoker  . Alcohol use No     Comment: No etoh since 2009     Allergies   Bee venom; Food; Lipitor [atorvastatin]; Aspirin; Banana; Metolazone; Oatmeal; Orange juice [orange oil]; Torsemide; Diltiazem; and Hydralazine   Review of Systems Review of Systems  A complete 10 system review of systems was obtained and all systems are negative except as noted in the HPI and PMH.    Physical Exam Updated Vital Signs BP (!) 158/126 (BP Location: Left Arm)   Pulse (!) 52   Temp 98.4 F (36.9  C) (Oral)   Resp 20   Ht 6' (1.829 m)   Wt (!) 318 lb (144.2 kg)   SpO2 100%   BMI 43.13 kg/m   Physical Exam  Constitutional: He is oriented to person, place, and time. He appears well-developed and well-nourished. No distress.  Seems more sleepy than usual. Wearing sunglasses.  HENT:  Head: Normocephalic and atraumatic.  Mouth/Throat: Oropharynx is clear and moist. No oropharyngeal exudate.  Eyes: Conjunctivae and EOM are normal. Pupils are equal, round, and reactive to light.  Neck: Normal range of motion. Neck supple.  No meningismus.  Cardiovascular: Normal rate, regular rhythm, normal heart sounds and intact distal pulses.   No murmur heard. Pulmonary/Chest: Effort normal and breath sounds normal. No respiratory distress.  He has rales to mid lungs and scattered wheezing.  Abdominal: Soft. There is no tenderness. There is no rebound and no guarding.  Musculoskeletal: Normal range of motion. He exhibits edema. He exhibits no tenderness.  +4 Edema to knees bilaterally.  Neurological: He is alert and oriented to person, place, and time. No cranial nerve deficit. He  exhibits normal muscle tone. Coordination normal.   5/5 strength throughout. CN 2-12 intact.Equal grip strength.   Skin: Skin is warm.  Psychiatric: He has a normal mood and affect. His behavior is normal.  Nursing note and vitals reviewed.    ED Treatments / Results  DIAGNOSTIC STUDIES: Oxygen Saturation is 100% on [place], normal by my interpretation.    COORDINATION OF CARE: 11:01 PM Discussed treatment plan with pt at bedside and pt agreed to plan. I will check the patient labs and EKG.  Labs (all labs ordered are listed, but only abnormal results are displayed) Labs Reviewed  CBC WITH DIFFERENTIAL/PLATELET - Abnormal; Notable for the following:       Result Value   RBC 3.98 (*)    Hemoglobin 10.0 (*)    HCT 32.1 (*)    MCH 25.1 (*)    RDW 17.6 (*)    All other components within normal limits  BASIC METABOLIC PANEL - Abnormal; Notable for the following:    Potassium 3.4 (*)    Calcium 8.5 (*)    All other components within normal limits  TROPONIN I - Abnormal; Notable for the following:    Troponin I 0.03 (*)    All other components within normal limits  BRAIN NATRIURETIC PEPTIDE - Abnormal; Notable for the following:    B Natriuretic Peptide 1,083.0 (*)    All other components within normal limits  PROTIME-INR - Abnormal; Notable for the following:    Prothrombin Time 16.5 (*)    All other components within normal limits  BLOOD GAS, VENOUS - Abnormal; Notable for the following:    pO2, Ven 46.3 (*)    All other components within normal limits  URINALYSIS, ROUTINE W REFLEX MICROSCOPIC - Abnormal; Notable for the following:    Color, Urine STRAW (*)    All other components within normal limits  RAPID URINE DRUG SCREEN, HOSP PERFORMED    EKG  EKG Interpretation  Date/Time:  Tuesday August 18 2016 22:18:44 EST Ventricular Rate:  102 PR Interval:    QRS Duration: 96 QT Interval:  376 QTC Calculation: 490 R Axis:   70 Text Interpretation:  Atrial fibrillation  Ventricular bigeminy Borderline repolarization abnormality Borderline prolonged QT interval No significant change was found Confirmed by Manus Gunning  MD, Marlaysia Lenig [phone]) on 08/18/2016 10:55:44 PM       Radiology Dg Chest Portable 1  View  Result Date: 08/18/2016 CLINICAL DATA:  Shortness of breath. EXAM: PORTABLE CHEST 1 VIEW COMPARISON:  Multiple prior exams most recently 08/13/2016 FINDINGS: Cardiomegaly is again seen. Vascular congestion. There is increased perihilar edema. Question of small pleural effusions versus habitus. No confluent airspace disease. IMPRESSION: CHF with increased perihilar edema from recent prior. Electronically Signed   By: Rubye Oaks M.D.   On: 08/18/2016 23:23    Procedures Procedures (including critical care time)  Medications Ordered in ED Medications  furosemide (LASIX) injection 60 mg (60 mg Intravenous Given 08/18/16 2308)  ipratropium-albuterol (DUONEB) 0.5-2.5 (3) MG/3ML nebulizer solution 3 mL (3 mLs Nebulization Given 08/18/16 2316)     Initial Impression / Assessment and Plan / ED Course  I have reviewed the triage vital signs and the nursing notes.  Pertinent labs & imaging results that were available during my care of the patient were reviewed by me and considered in my medical decision making (see chart for details).     Patient presents with recurrent shortness of breath onset today. Associated with vague intermittent episodes of chest pain. History of nonischemic cardiomyopathy and presumed compliance with medications. Saw his cardiologist yesterday.  Patient dyspneic with conversation. Exam consistent with CHF exacerbation. Stable anemia and troponin elevation.  BP remains uncontrolled. Patient dyspneic with attempted exertion and ambulation.  No hypoxia  But becomes quite short of breath.  Diuresed with 60 mg of lasix. PUt out 2 liter of urine.  Patient was diuresing well. His chest pain is atypical for ACS. His troponins melena elevated  as consistent with previous values. He had recent catheter catheterization that was reassuring.  Given his ongoing dyspnea and increased work of breathing will admit for IV diuresis. Discussed with Dr. Onalee Hua. Final Clinical Impressions(s) / ED Diagnoses   Final diagnoses:  Acute on chronic systolic CHF (congestive heart failure) (HCC)    New Prescriptions New Prescriptions   No medications on file   I personally performed the services described in this documentation, which was scribed in my presence. The recorded information has been reviewed and is accurate.    Glynn Octave, MD 08/19/16 [phone]

## 2016-08-18 NOTE — ED Triage Notes (Signed)
Pt brought in by [facility] for c/o sob that started yesterday and chest pain

## 2016-08-19 DIAGNOSIS — Z9981 Dependence on supplemental oxygen: Secondary | ICD-10-CM | POA: Diagnosis not present

## 2016-08-19 DIAGNOSIS — N182 Chronic kidney disease, stage 2 (mild): Secondary | ICD-10-CM | POA: Diagnosis present

## 2016-08-19 DIAGNOSIS — I4581 Long QT syndrome: Secondary | ICD-10-CM | POA: Diagnosis present

## 2016-08-19 DIAGNOSIS — I130 Hypertensive heart and chronic kidney disease with heart failure and stage 1 through stage 4 chronic kidney disease, or unspecified chronic kidney disease: Secondary | ICD-10-CM | POA: Diagnosis present

## 2016-08-19 DIAGNOSIS — Z79899 Other long term (current) drug therapy: Secondary | ICD-10-CM | POA: Diagnosis not present

## 2016-08-19 DIAGNOSIS — Z9111 Patient's noncompliance with dietary regimen: Secondary | ICD-10-CM | POA: Diagnosis not present

## 2016-08-19 DIAGNOSIS — I5023 Acute on chronic systolic (congestive) heart failure: Secondary | ICD-10-CM | POA: Diagnosis not present

## 2016-08-19 DIAGNOSIS — Z8711 Personal history of peptic ulcer disease: Secondary | ICD-10-CM | POA: Diagnosis not present

## 2016-08-19 DIAGNOSIS — I252 Old myocardial infarction: Secondary | ICD-10-CM | POA: Diagnosis not present

## 2016-08-19 DIAGNOSIS — Z87891 Personal history of nicotine dependence: Secondary | ICD-10-CM | POA: Diagnosis not present

## 2016-08-19 DIAGNOSIS — Z7901 Long term (current) use of anticoagulants: Secondary | ICD-10-CM | POA: Diagnosis not present

## 2016-08-19 DIAGNOSIS — Z6839 Body mass index (BMI) 39.0-39.9, adult: Secondary | ICD-10-CM | POA: Diagnosis not present

## 2016-08-19 DIAGNOSIS — E662 Morbid (severe) obesity with alveolar hypoventilation: Secondary | ICD-10-CM | POA: Diagnosis present

## 2016-08-19 DIAGNOSIS — I5043 Acute on chronic combined systolic (congestive) and diastolic (congestive) heart failure: Secondary | ICD-10-CM | POA: Diagnosis present

## 2016-08-19 DIAGNOSIS — G4733 Obstructive sleep apnea (adult) (pediatric): Secondary | ICD-10-CM | POA: Diagnosis present

## 2016-08-19 DIAGNOSIS — Z7951 Long term (current) use of inhaled steroids: Secondary | ICD-10-CM | POA: Diagnosis not present

## 2016-08-19 DIAGNOSIS — I472 Ventricular tachycardia: Secondary | ICD-10-CM | POA: Diagnosis present

## 2016-08-19 DIAGNOSIS — I481 Persistent atrial fibrillation: Secondary | ICD-10-CM | POA: Diagnosis present

## 2016-08-19 DIAGNOSIS — R0602 Shortness of breath: Secondary | ICD-10-CM | POA: Diagnosis present

## 2016-08-19 DIAGNOSIS — Z91030 Bee allergy status: Secondary | ICD-10-CM | POA: Diagnosis not present

## 2016-08-19 DIAGNOSIS — I255 Ischemic cardiomyopathy: Secondary | ICD-10-CM | POA: Diagnosis present

## 2016-08-19 DIAGNOSIS — I10 Essential (primary) hypertension: Secondary | ICD-10-CM | POA: Diagnosis not present

## 2016-08-19 DIAGNOSIS — Z8249 Family history of ischemic heart disease and other diseases of the circulatory system: Secondary | ICD-10-CM | POA: Diagnosis not present

## 2016-08-19 DIAGNOSIS — Z823 Family history of stroke: Secondary | ICD-10-CM | POA: Diagnosis not present

## 2016-08-19 DIAGNOSIS — E785 Hyperlipidemia, unspecified: Secondary | ICD-10-CM | POA: Diagnosis present

## 2016-08-19 DIAGNOSIS — K219 Gastro-esophageal reflux disease without esophagitis: Secondary | ICD-10-CM | POA: Diagnosis present

## 2016-08-19 DIAGNOSIS — Z886 Allergy status to analgesic agent status: Secondary | ICD-10-CM | POA: Diagnosis not present

## 2016-08-19 DIAGNOSIS — Z9114 Patient's other noncompliance with medication regimen: Secondary | ICD-10-CM | POA: Diagnosis not present

## 2016-08-19 DIAGNOSIS — J45909 Unspecified asthma, uncomplicated: Secondary | ICD-10-CM | POA: Diagnosis present

## 2016-08-19 LAB — URINALYSIS, ROUTINE W REFLEX MICROSCOPIC
BILIRUBIN URINE: NEGATIVE
Glucose, UA: NEGATIVE mg/dL
Hgb urine dipstick: NEGATIVE
KETONES UR: NEGATIVE mg/dL
Leukocytes, UA: NEGATIVE
NITRITE: NEGATIVE
PH: 5 (ref 5.0–8.0)
Protein, ur: NEGATIVE mg/dL
Specific Gravity, Urine: 1.005 (ref 1.005–1.030)

## 2016-08-19 LAB — RAPID URINE DRUG SCREEN, HOSP PERFORMED
Amphetamines: NOT DETECTED
BARBITURATES: NOT DETECTED
Benzodiazepines: NOT DETECTED
COCAINE: NOT DETECTED
OPIATES: NOT DETECTED
Tetrahydrocannabinol: NOT DETECTED

## 2016-08-19 MED ORDER — ONDANSETRON HCL 4 MG/2ML IJ SOLN
4.0000 mg | Freq: Four times a day (QID) | INTRAMUSCULAR | Status: DC | PRN
  Administered 2016-08-19: 4 mg via INTRAVENOUS
  Filled 2016-08-19: qty 2

## 2016-08-19 MED ORDER — SPIRONOLACTONE 25 MG PO TABS
50.0000 mg | ORAL_TABLET | Freq: Every day | ORAL | Status: DC
  Administered 2016-08-19 – 2016-08-21 (×3): 50 mg via ORAL
  Filled 2016-08-19 (×4): qty 2
  Filled 2016-08-19: qty 1

## 2016-08-19 MED ORDER — FUROSEMIDE 10 MG/ML IJ SOLN
80.0000 mg | Freq: Two times a day (BID) | INTRAMUSCULAR | Status: DC
  Administered 2016-08-19 – 2016-08-21 (×5): 80 mg via INTRAVENOUS
  Filled 2016-08-19 (×5): qty 8

## 2016-08-19 MED ORDER — ACETAMINOPHEN 325 MG PO TABS
650.0000 mg | ORAL_TABLET | ORAL | Status: DC | PRN
  Administered 2016-08-20 – 2016-08-21 (×2): 650 mg via ORAL
  Filled 2016-08-19 (×2): qty 2

## 2016-08-19 MED ORDER — SODIUM CHLORIDE 0.9 % IV SOLN: 250.0000 mL | INTRAVENOUS | Status: DC | PRN

## 2016-08-19 MED ORDER — ORAL CARE MOUTH RINSE
15.0000 mL | Freq: Two times a day (BID) | OROMUCOSAL | Status: DC
  Administered 2016-08-19 – 2016-08-20 (×3): 15 mL via OROMUCOSAL

## 2016-08-19 MED ORDER — SODIUM CHLORIDE 0.9% FLUSH
3.0000 mL | Freq: Two times a day (BID) | INTRAVENOUS | Status: DC
  Administered 2016-08-19 – 2016-08-21 (×5): 3 mL via INTRAVENOUS

## 2016-08-19 MED ORDER — CARVEDILOL 12.5 MG PO TABS
25.0000 mg | ORAL_TABLET | Freq: Two times a day (BID) | ORAL | Status: DC
Start: 2016-08-19 — End: 2016-08-21
  Administered 2016-08-19 – 2016-08-21 (×5): 25 mg via ORAL
  Filled 2016-08-19 (×5): qty 2

## 2016-08-19 MED ORDER — LISINOPRIL 5 MG PO TABS
5.0000 mg | ORAL_TABLET | Freq: Every day | ORAL | Status: DC
  Administered 2016-08-19 – 2016-08-21 (×3): 5 mg via ORAL
  Filled 2016-08-19 (×3): qty 1

## 2016-08-19 MED ORDER — FUROSEMIDE 10 MG/ML IJ SOLN: 80.0000 mg | Freq: Two times a day (BID) | INTRAMUSCULAR | Status: DC

## 2016-08-19 MED ORDER — ATORVASTATIN CALCIUM 40 MG PO TABS
40.0000 mg | ORAL_TABLET | Freq: Every day | ORAL | Status: DC
  Administered 2016-08-19 – 2016-08-20 (×2): 40 mg via ORAL
  Filled 2016-08-19 (×2): qty 1

## 2016-08-19 MED ORDER — SODIUM CHLORIDE 0.9% FLUSH: 3.0000 mL | INTRAVENOUS | Status: DC | PRN

## 2016-08-19 MED ORDER — RIVAROXABAN 20 MG PO TABS
20.0000 mg | ORAL_TABLET | Freq: Every day | ORAL | Status: DC
  Administered 2016-08-19 – 2016-08-21 (×3): 20 mg via ORAL
  Filled 2016-08-19 (×3): qty 1

## 2016-08-19 NOTE — Progress Notes (Signed)
08/18/2016 10:15 PM  08/19/2016 12:01 PM  [name] was seen and examined.  The H&P by the admitting provider , orders, imaging was reviewed.  Please see orders.  Will continue to follow.   Maryln Manuel, MD [facility]

## 2016-08-19 NOTE — Care Management Obs Status (Signed)
MEDICARE OBSERVATION STATUS NOTIFICATION   Patient Details  Name: [name] MRN: [id number] Date of Birth: November 21, 1965   Medicare Observation Status Notification Given:  Yes    [name], RN 08/19/2016, 9:53 AM

## 2016-08-19 NOTE — H&P (Signed)
History and Physical    [name] ZOX:[id number] DOB: Dec 02, 1965 DOA: 08/18/2016  PCP: Pearson Grippe, MD  Patient coming from:  home  Chief Complaint:  sob  HPI: [name] is a 52 y.o. male with medical history significant of frequent hospitalizations for noncompliance with medications, CHF, afib, HTN, OSA comes in with several days of sob , he says he takes his medications but lasix and demedex pills make his legs swell worse at home.  He does not watch his salt intake.  Has had several days of worsening swelling in his legs.  Pt found to be in chf exacerbation, has normal 02 sats on his routine 3 liters at home.     Review of Systems: As per HPI otherwise 10 point review of systems negative.   Past Medical History:  Diagnosis Date  . Allergic rhinitis   . Asthma   . Atrial fibrillation (HCC)   . CHF (congestive heart failure) (HCC)   . Essential hypertension   . Gastroesophageal reflux disease   . Heart attack   . History of cardiac catheterization    Normal coronaries December 2016  . Noncompliance    Major problem leading to declining health and recurrent hospitalization  . Nonischemic cardiomyopathy (HCC)    LVEF 20-25%  . OSA (obstructive sleep apnea)   . Osteoarthritis   . Peptic ulcer disease     Past Surgical History:  Procedure Laterality Date  . CARDIAC CATHETERIZATION N/A 06/14/2015   Procedure: Right/Left Heart Cath and Coronary Angiography;  Surgeon: Runell Gess, MD;  Location: [facility] INVASIVE CV LAB;  Service: Cardiovascular;  Laterality: N/A;     reports that he quit smoking about 9 years ago. His smoking use included Cigarettes. He started smoking about 28 years ago. He has a 10.00 pack-year smoking history. He has never used smokeless tobacco. He reports that he does not drink alcohol or use drugs.  Allergies  Allergen Reactions  . Bee Venom Shortness Of Breath, Swelling and Other (See Comments)    Reaction:  Facial swelling  . Food Shortness Of  Breath, Rash and Other (See Comments)    Pt states that he is allergic to strawberries.    . Lipitor [Atorvastatin] Shortness Of Breath and Other (See Comments)    Reaction:  Nose bleeds   . Aspirin Other (See Comments)    Reaction:  GI upset   . Banana Diarrhea  . Metolazone Other (See Comments)    Patient states that he stopped taking due to "heart attack" like symptoms  . Oatmeal Nausea And Vomiting    Per pt, allergic to oatmeal.  . Orange Juice [Orange Oil] Nausea And Vomiting  . Torsemide Swelling and Other (See Comments)    Reaction:  Swelling of feet/legs   . Diltiazem Palpitations  . Hydralazine Palpitations    Family History  Problem Relation Age of Onset  . Stroke Father   . Heart attack Father   . Aneurysm Mother     Cerebral aneurysm  . Hypertension Sister   . Colon cancer Neg Hx   . Inflammatory bowel disease Neg Hx   . Liver disease Neg Hx     Prior to Admission medications   Medication Sig Start Date End Date Taking? Authorizing Provider  atorvastatin (LIPITOR) 40 MG tablet Take 1 tablet (40 mg total) by mouth daily at 6 PM. 06/25/16  Yes Kathlen Mody, MD  beclomethasone (QVAR) 80 MCG/ACT inhaler Inhale 2 puffs into the lungs 2 (two)  times daily as needed (for shortness of breath).   Yes Historical Provider, MD  bumetanide (BUMEX) 2 MG tablet Take 1 tablet (2 mg total) by mouth 2 (two) times daily. 07/25/16  Yes Catarina Hartshorn, MD  carvedilol (COREG) 25 MG tablet Take 1 tablet (25 mg total) by mouth 2 (two) times daily with a meal. 06/25/16  Yes Kathlen Mody, MD  docusate sodium (COLACE) 100 MG capsule Take 1 capsule (100 mg total) by mouth 2 (two) times daily. 06/25/16  Yes Kathlen Mody, MD  ferrous gluconate (FERGON) 324 MG tablet Take 1 tablet (324 mg total) by mouth 2 (two) times daily with a meal. 06/25/16  Yes Kathlen Mody, MD  lisinopril (PRINIVIL,ZESTRIL) 5 MG tablet Take 1 tablet (5 mg total) by mouth daily. 06/25/16  Yes Kathlen Mody, MD  OXYGEN Inhale 3 L into the  lungs daily.   Yes Historical Provider, MD  pantoprazole (PROTONIX) 40 MG tablet Take 1 tablet (40 mg total) by mouth daily at 6 (six) AM. 06/26/16  Yes Kathlen Mody, MD  potassium chloride SA (K-DUR,KLOR-CON) 20 MEQ tablet Take 2 tablets (40 mEq total) by mouth daily. Patient taking differently: Take 20 mEq by mouth 2 (two) times daily.  06/25/16  Yes Kathlen Mody, MD  rivaroxaban (XARELTO) 20 MG TABS tablet Take 1 tablet (20 mg total) by mouth daily with breakfast. Patient taking differently: Take 20 mg by mouth daily.  06/26/16  Yes Kathlen Mody, MD  spironolactone (ALDACTONE) 50 MG tablet Take 1 tablet (50 mg total) by mouth daily. 06/25/16  Yes Kathlen Mody, MD  tamsulosin (FLOMAX) 0.4 MG CAPS capsule Take 1 capsule (0.4 mg total) by mouth daily. 06/25/16  Yes Kathlen Mody, MD  torsemide (DEMADEX) 20 MG tablet Take 20 mg by mouth 2 (two) times daily. 08/07/16  Yes Historical Provider, MD    Physical Exam: Vitals:   08/18/16 2300 08/19/16 0100 08/19/16 0153 08/19/16 0200  BP: (!) 161/121 (!) 183/124 (!) 167/134 (!) 158/126  Pulse: 103 62 93 (!) 52  Resp: (!) 29 18 20 20   Temp:      TempSrc:      SpO2: 98% 100% 99% 100%  Weight:      Height:        Constitutional: NAD, calm, comfortable Vitals:   08/18/16 2300 08/19/16 0100 08/19/16 0153 08/19/16 0200  BP: (!) 161/121 (!) 183/124 (!) 167/134 (!) 158/126  Pulse: 103 62 93 (!) 52  Resp: (!) 29 18 20 20   Temp:      TempSrc:      SpO2: 98% 100% 99% 100%  Weight:      Height:       Eyes: PERRL, lids and conjunctivae normal ENMT: Mucous membranes are moist. Posterior pharynx clear of any exudate or lesions.Normal dentition.  Neck: normal, supple, no masses, no thyromegaly Respiratory: clear to auscultation bilaterally, no wheezing, no crackles. Normal respiratory effort. No accessory muscle use.  Cardiovascular: Regular rate and rhythm, no murmurs / rubs / gallops. 3+ extremity edema. 2+ pedal pulses. No carotid bruits.  Abdomen: no  tenderness, no masses palpated. No hepatosplenomegaly. Bowel sounds positive.  Musculoskeletal: no clubbing / cyanosis. No joint deformity upper and lower extremities. Good ROM, no contractures. Normal muscle tone.  Skin: no rashes, lesions, ulcers. No induration Neurologic: CN 2-12 grossly intact. Sensation intact, DTR normal. Strength 5/5 in all 4.  Psychiatric: Normal judgment and insight. Alert and oriented x 3. Normal mood.    Labs on Admission: I have personally  reviewed following labs and imaging studies  CBC:  Recent Labs Lab 08/13/16 2344 08/18/16 2255  WBC 3.9* 4.4  NEUTROABS 2.6 3.1  HGB 10.3* 10.0*  HCT 32.5* 32.1*  MCV 80.0 80.7  PLT 202 223   Basic Metabolic Panel:  Recent Labs Lab 08/13/16 2344 08/18/16 2255  NA 139 137  K 3.6 3.4*  CL 107 106  CO2 25 25  GLUCOSE 88 92  BUN 17 19  CREATININE 1.03 1.07  CALCIUM 8.9 8.5*   GFR: Estimated Creatinine Clearance: 124.4 mL/min (by C-G formula based on SCr of 1.07 mg/dL).  Coagulation Profile:  Recent Labs Lab 08/18/16 2255  INR 1.33   Cardiac Enzymes:  Recent Labs Lab 08/13/16 2344 08/14/16 0132 08/18/16 2255  TROPONINI 0.03* 0.03* 0.03*   Urine analysis:    Component Value Date/Time   COLORURINE STRAW (A) 08/19/2016 0133   APPEARANCEUR CLEAR 08/19/2016 0133   LABSPEC 1.005 08/19/2016 0133   PHURINE 5.0 08/19/2016 0133   GLUCOSEU NEGATIVE 08/19/2016 0133   GLUCOSEU NEG mg/dL 16/03/9603 5409   HGBUR NEGATIVE 08/19/2016 0133   BILIRUBINUR NEGATIVE 08/19/2016 0133   KETONESUR NEGATIVE 08/19/2016 0133   PROTEINUR NEGATIVE 08/19/2016 0133   UROBILINOGEN 0.2 08/08/2014 1445   NITRITE NEGATIVE 08/19/2016 0133   LEUKOCYTESUR NEGATIVE 08/19/2016 0133    Radiological Exams on Admission: Dg Chest Portable 1 View  Result Date: 08/18/2016 CLINICAL DATA:  Shortness of breath. EXAM: PORTABLE CHEST 1 VIEW COMPARISON:  Multiple prior exams most recently 08/13/2016 FINDINGS: Cardiomegaly is again  seen. Vascular congestion. There is increased perihilar edema. Question of small pleural effusions versus habitus. No confluent airspace disease. IMPRESSION: CHF with increased perihilar edema from recent prior. Electronically Signed   By: Rubye Oaks M.D.   On: 08/18/2016 23:23    EKG: Independently reviewed. nsr pvcs  Assessment/Plan 52 yo male with acute on chronic chf exacerbation likely due to noncompliance  Active Problems:   Essential hypertension- resume home meds   Atrial fibrillation (HCC)- noted, supposed to be on xaralto   Obesity hypoventilation syndrome (HCC)- noted   Noncompliance with diet and medication regimen- this is well documented in cardiology notes and pt has been educated many times about medical compliance   CKD (chronic kidney disease), stage II- stable at baseline   Systolic CHF, acute on chronic (HCC)- place on lasix 80mg  iv q 12 hours.  Will not repeat any imaging at this time    DVT prophylaxis:  scds Code Status:  full Family Communication: none  Disposition Plan:  Per day team Consults called:  none Admission status:  observation   Arial Galligan A MD [facility]  If 7PM-7AM, please contact night-coverage [email or web address] Password [facility]  08/19/2016, 2:48 AM

## 2016-08-19 NOTE — Progress Notes (Signed)
Went I the patients room with bed side reporting and found that the patient was SOB, vomiting, and appearing to be in distress.  Patient stated that he was nauseated and I voiced to him that I was going to get him some nausea medication.  He stated that he didn't want it and that he would be okay.  His O2 was not on at this time, it was beside him in the bed.  He was asked to replace his O2 and he said he would.  While going to get his nausea medication the NT Josh reported to me that the patient was breathing at a rate of 28, the night shift nurse reports his HR being 130's-140's, and the NT states re reapplied the patients O2 and explained the importance of keeping it on.  Once in the room assessed his O2 sats and they were 69-70% on room air, he had taken off his O2 again.  I replaced and increased his O2 to 4 LPM via [place] and notified respiratory.  She came I to assess the patient and witnessed that the patient was spiting up a clear pink with copious amounts of sputum.  Dr. Laural Benes was notified via text.  He responded stating he will go see the patient. Voiced to him that the patients HR remains 120, I had given his Lasix, and that I will give him his Coreg as soon as he can tolerate the po.  Will continue to moniter the patient.

## 2016-08-19 NOTE — Care Management Note (Signed)
Case Management Note  Patient Details  Name: [name] MRN: [id number] Date of Birth: 1965/11/20  Subjective/Objective:                  Pt is from home, lives with mother. Admitted with CHF. Pt wears continuous supplemental oxygen on 3 LPM at home PTA. Pt with no HH services PTA due to extreme non-compliance. Pt plans to return home with self care.   Action/Plan: No CM assistance anticipated.   Expected Discharge Date:    3/2//2018              Expected Discharge Plan:  Home/Self Care  In-House Referral:  NA  Discharge planning Services  CM Consult  Post Acute Care Choice:  NA Choice offered to:  NA  Status of Service:  Completed, signed off  [name], RN 08/19/2016, 9:54 AM

## 2016-08-20 DIAGNOSIS — N182 Chronic kidney disease, stage 2 (mild): Secondary | ICD-10-CM

## 2016-08-20 DIAGNOSIS — Z9111 Patient's noncompliance with dietary regimen: Secondary | ICD-10-CM

## 2016-08-20 DIAGNOSIS — I10 Essential (primary) hypertension: Secondary | ICD-10-CM

## 2016-08-20 DIAGNOSIS — Z9114 Patient's other noncompliance with medication regimen: Secondary | ICD-10-CM

## 2016-08-20 DIAGNOSIS — I5023 Acute on chronic systolic (congestive) heart failure: Secondary | ICD-10-CM

## 2016-08-20 DIAGNOSIS — E662 Morbid (severe) obesity with alveolar hypoventilation: Secondary | ICD-10-CM

## 2016-08-20 DIAGNOSIS — I481 Persistent atrial fibrillation: Secondary | ICD-10-CM

## 2016-08-20 LAB — BASIC METABOLIC PANEL
Anion gap: 7 (ref 5–15)
BUN: 20 mg/dL (ref 6–20)
CALCIUM: 8.3 mg/dL — AB (ref 8.9–10.3)
CO2: 31 mmol/L (ref 22–32)
CREATININE: 1.12 mg/dL (ref 0.61–1.24)
Chloride: 103 mmol/L (ref 101–111)
GFR calc non Af Amer: >60 mL/min (ref >60)
Glucose, Bld: 85 mg/dL (ref 65–99)
Potassium: 3.3 mmol/L — ABNORMAL LOW (ref 3.5–5.1)
SODIUM: 141 mmol/L (ref 135–145)

## 2016-08-20 MED ORDER — POTASSIUM CHLORIDE CRYS ER 20 MEQ PO TBCR
40.0000 meq | EXTENDED_RELEASE_TABLET | Freq: Once | ORAL | Status: AC
  Administered 2016-08-20: 40 meq via ORAL
  Filled 2016-08-20: qty 2

## 2016-08-20 NOTE — Progress Notes (Signed)
PROGRESS NOTE    Samuel KENNA  Fitzpatrick:[id number]  DOB: [birth year]  DOA: 08/18/2016 PCP: Pearson Grippe, MD Outpatient Specialists: [facility] [place] course: [name] is a 51 y.o. male with medical history significant of frequent hospitalizations for noncompliance with medications, CHF, afib, HTN, OSA comes in with several days of sob , he says he takes his medications but lasix and demedex pills make his legs swell worse at home.  He does not watch his salt intake.  Has had several days of worsening swelling in his legs.  Pt found to be in chf exacerbation, has normal 02 sats on his routine 3 liters at home.    Assessment & Plan:   1. Acute on chronic combined systolic and diastolic heart failure: Continue carvedilol twice daily and IV lasix as prescribed.  Weights recorded unfortunately are not accurate and stressed to care team importance of accurate daily weights and I/O measurements.  Pt is diuresing well, lost nearly 9L of fluid since admission.   2. Nonischemic cardiomyopathy, LVEF 20-25%: Normal coronary arteries in 05/2015. Follow up with cardiology when discharged.    3. Persistent atrial fibrillation: Prescribed Xarelto. Unclear that he takes it at home. Encouraged to take Coreg as prescribed to control heart rate.  4. Essential HTN: Seems controlled when takes medication, markedly high when not taking medication. Noncompliant with Rx. Encouraged to take Coreg as prescribed.  5. Medication/treatment noncompliance: Repeatedly educated regarding health hazards which did not change his habits.  Disposition Plan: Home in 1-2 days  Subjective: Pt reports that he is starting to feel better, had been refusing to wear supplemental oxygen  Objective: Vitals:   08/19/16 0623 08/19/16 1422 08/19/16 2159 08/20/16 0620  BP: (!) 151/113 (!) 142/94 (!) 144/89 140/87  Pulse: (!) 113 (!) 111 (!) 108 100  Resp: 20 18 20 20   Temp: 98.9 F (37.2 C) 98.4 F (36.9 C) 98.2 F (36.8  C) 98 F (36.7 C)  TempSrc: Oral Oral Oral Oral  SpO2: 99% 98% 97% 98%  Weight:    (!) 141.8 kg (312 lb 9.1 oz)  Height:        Intake/Output Summary (Last 24 hours) at 08/20/16 1436 Last data filed at 08/20/16 0847  Gross per 24 hour  Intake              483 ml  Output             3550 ml  Net            -3067 ml   Filed Weights   08/18/16 2217 08/19/16 0330 08/20/16 0620  Weight: (!) 144.2 kg (318 lb) (!) 141.8 kg (312 lb 9.6 oz) (!) 141.8 kg (312 lb 9.1 oz)    Exam:  General exam: awake, alert, NAD Respiratory system: No increased work of breathing. Cardiovascular system: S1 & S2 heard Gastrointestinal system: Abdomen is nondistended, soft and nontender. Normal bowel sounds heard. Central nervous system: Alert and oriented. No focal neurological deficits. Extremities: 2+ edema BLE.  Data Reviewed: Basic Metabolic Panel:  Recent Labs Lab 08/13/16 2344 08/18/16 2255 08/20/16 0603  NA 139 137 141  K 3.6 3.4* 3.3*  CL 107 106 103  CO2 25 25 31   GLUCOSE 88 92 85  BUN 17 19 20   CREATININE 1.03 1.07 1.12  CALCIUM 8.9 8.5* 8.3*   Liver Function Tests: No results for input(s): AST, ALT, ALKPHOS, BILITOT, PROT, ALBUMIN in the last 168 hours. No results for input(s): LIPASE, AMYLASE  in the last 168 hours. No results for input(s): AMMONIA in the last 168 hours. CBC:  Recent Labs Lab 08/13/16 2344 08/18/16 2255  WBC 3.9* 4.4  NEUTROABS 2.6 3.1  HGB 10.3* 10.0*  HCT 32.5* 32.1*  MCV 80.0 80.7  PLT 202 223   Cardiac Enzymes:  Recent Labs Lab 08/13/16 2344 08/14/16 0132 08/18/16 2255  TROPONINI 0.03* 0.03* 0.03*   CBG (last 3)  No results for input(s): GLUCAP in the last 72 hours. No results found for this or any previous visit (from the past 240 hour(s)).   Studies: Dg Chest Portable 1 View  Result Date: 08/18/2016 CLINICAL DATA:  Shortness of breath. EXAM: PORTABLE CHEST 1 VIEW COMPARISON:  Multiple prior exams most recently 08/13/2016 FINDINGS:  Cardiomegaly is again seen. Vascular congestion. There is increased perihilar edema. Question of small pleural effusions versus habitus. No confluent airspace disease. IMPRESSION: CHF with increased perihilar edema from recent prior. Electronically Signed   By: Rubye Oaks M.D.   On: 08/18/2016 23:23     Scheduled Meds: . atorvastatin  40 mg Oral q1800  . carvedilol  25 mg Oral BID WC  . furosemide  80 mg Intravenous BID  . lisinopril  5 mg Oral Daily  . mouth rinse  15 mL Mouth Rinse BID  . rivaroxaban  20 mg Oral Q breakfast  . sodium chloride flush  3 mL Intravenous Q12H  . spironolactone  50 mg Oral Daily   Continuous Infusions:  Active Problems:   Essential hypertension   Atrial fibrillation (HCC)   Obesity hypoventilation syndrome (HCC)   Noncompliance with diet and medication regimen   CKD (chronic kidney disease), stage II   Acute on chronic systolic CHF (congestive heart failure) (HCC)   Systolic CHF, acute on chronic (HCC)  Time spent:   Standley Dakins, MD, FAAFP [facility] Pager [phone]  If 7PM-7AM, please contact night-coverage [email or web address] Password [email or web address] 08/20/2016, 2:36 PM    LOS: 1 day

## 2016-08-21 LAB — BASIC METABOLIC PANEL
Anion gap: 9 (ref 5–15)
BUN: 23 mg/dL — ABNORMAL HIGH (ref 6–20)
CHLORIDE: 102 mmol/L (ref 101–111)
CO2: 29 mmol/L (ref 22–32)
Calcium: 8.5 mg/dL — ABNORMAL LOW (ref 8.9–10.3)
Creatinine, Ser: 1.15 mg/dL (ref 0.61–1.24)
GFR calc non Af Amer: >60 mL/min (ref >60)
Glucose, Bld: 82 mg/dL (ref 65–99)
POTASSIUM: 3.4 mmol/L — AB (ref 3.5–5.1)
SODIUM: 140 mmol/L (ref 135–145)

## 2016-08-21 MED ORDER — POTASSIUM CHLORIDE CRYS ER 20 MEQ PO TBCR
40.0000 meq | EXTENDED_RELEASE_TABLET | Freq: Once | ORAL | Status: AC
  Administered 2016-08-21: 40 meq via ORAL
  Filled 2016-08-21: qty 2

## 2016-08-21 NOTE — Discharge Summary (Signed)
Physician Discharge Summary  [name] ZOX:[id number] DOB: 09-Dec-1965 DOA: 08/18/2016  PCP: Pearson Grippe, MD  Admit date: 08/18/2016 Discharge date: 08/21/2016  Admitted From:  Home  Disposition:  Home   Recommendations for Outpatient Follow-up:  1. Follow up with PCP in 1 weeks and cardiology in 2 weeks 2. Please obtain BMP/CBC in 1-2 weeks  Discharge Condition: STABLE  CODE STATUS: FULL  Diet recommendation: Heart Healthy  Brief/Interim Summary: HPI: [name] is a 51 y.o. male with medical history significant of frequent hospitalizations for noncompliance with medications, CHF, afib, HTN, OSA comes in with several days of sob , he says he takes his medications but lasix and demedex pills make his legs swell worse at home.  He does not watch his salt intake.  Has had several days of worsening swelling in his legs.  Pt found to be in chf exacerbation, has normal 02 sats on his routine 3 liters at home.    Assessment & Plan:   1. Acute on chronic combined systolic and diastolic heart failure: Continued carvedilol twice daily and IV lasix 80 mg BID with good diuresis, down 12L fluid and weight down 11 pounds.  Pt counseled on the importance of taking medications as prescribed and following diet.  His is HIGH RISK for readmission given his long-standing history of noncompliance with diet and medication.  He feels much better now and stable for discharge home with close outpatient follow up.    2. Nonischemic cardiomyopathy, LVEF 20-25%: Normal coronary arteries in 05/2015. Follow up with cardiology when discharged.    3. Persistent atrial fibrillation: Prescribed Xarelto. Unclear that he takes it at home. Encouraged to take Coreg as prescribed to control heart rate.  4. Essential HTN: Seems controlled when takes medication, markedly high when not taking medication. Noncompliant with Rx. Encouraged to take Coreg as prescribed.  5. Medication/treatment noncompliance: Repeatedly  educated regarding health hazards which did not change his habits.  Disposition Plan: Home  Discharge Diagnoses:  Active Problems:   Essential hypertension   Atrial fibrillation (HCC)   Obesity hypoventilation syndrome (HCC)   Noncompliance with diet and medication regimen   CKD (chronic kidney disease), stage II   Acute on chronic systolic CHF (congestive heart failure) (HCC)   Systolic CHF, acute on chronic [facility])  Discharge Instructions  Discharge Instructions    Diet - low sodium heart healthy    Complete by:  As directed    Increase activity slowly    Complete by:  As directed      Allergies as of 08/21/2016      Reactions   Bee Venom Shortness Of Breath, Swelling, Other (See Comments)   Reaction:  Facial swelling   Food Shortness Of Breath, Rash, Other (See Comments)   Pt states that he is allergic to strawberries.     Lipitor [atorvastatin] Shortness Of Breath, Other (See Comments)   Reaction:  Nose bleeds    Aspirin Other (See Comments)   Reaction:  GI upset    Banana Diarrhea   Metolazone Other (See Comments)   Patient states that he stopped taking due to "heart attack" like symptoms   Oatmeal Nausea And Vomiting   Per pt, allergic to oatmeal.   Orange Juice [orange Oil] Nausea And Vomiting   Torsemide Swelling, Other (See Comments)   Reaction:  Swelling of feet/legs    Diltiazem Palpitations   Hydralazine Palpitations      Medication List    TAKE these medications  atorvastatin 40 MG tablet Commonly known as:  LIPITOR Take 1 tablet (40 mg total) by mouth daily at 6 PM.   beclomethasone 80 MCG/ACT inhaler Commonly known as:  QVAR Inhale 2 puffs into the lungs 2 (two) times daily as needed (for shortness of breath).   bumetanide 2 MG tablet Commonly known as:  BUMEX Take 1 tablet (2 mg total) by mouth 2 (two) times daily.   carvedilol 25 MG tablet Commonly known as:  COREG Take 1 tablet (25 mg total) by mouth 2 (two) times daily with a meal.    docusate sodium 100 MG capsule Commonly known as:  COLACE Take 1 capsule (100 mg total) by mouth 2 (two) times daily.   ferrous gluconate 324 MG tablet Commonly known as:  FERGON Take 1 tablet (324 mg total) by mouth 2 (two) times daily with a meal.   lisinopril 5 MG tablet Commonly known as:  PRINIVIL,ZESTRIL Take 1 tablet (5 mg total) by mouth daily.   OXYGEN Inhale 3 L into the lungs daily.   pantoprazole 40 MG tablet Commonly known as:  PROTONIX Take 1 tablet (40 mg total) by mouth daily at 6 (six) AM.   potassium chloride SA 20 MEQ tablet Commonly known as:  K-DUR,KLOR-CON Take 2 tablets (40 mEq total) by mouth daily. What changed:  how much to take  when to take this   rivaroxaban 20 MG Tabs tablet Commonly known as:  XARELTO Take 1 tablet (20 mg total) by mouth daily with breakfast. What changed:  when to take this   spironolactone 50 MG tablet Commonly known as:  ALDACTONE Take 1 tablet (50 mg total) by mouth daily.   tamsulosin 0.4 MG Caps capsule Commonly known as:  FLOMAX Take 1 capsule (0.4 mg total) by mouth daily.   torsemide 20 MG tablet Commonly known as:  DEMADEX Take 20 mg by mouth 2 (two) times daily.      Follow-up Information    Pearson Grippe, MD. Schedule an appointment as soon as possible for a visit in 1 week(s).   Specialty:  Internal Medicine Contact information: [address] [place] [address] [phone]        Prentice Docker, MD. Schedule an appointment as soon as possible for a visit in 1 week(s).   Specialty:  Cardiology Why:  Hospital Follow Up  Contact information: [address] [place] [address] [phone]          Allergies  Allergen Reactions  . Bee Venom Shortness Of Breath, Swelling and Other (See Comments)    Reaction:  Facial swelling  . Food Shortness Of Breath, Rash and Other (See Comments)    Pt states that he is allergic to strawberries.    . Lipitor [Atorvastatin] Shortness Of  Breath and Other (See Comments)    Reaction:  Nose bleeds   . Aspirin Other (See Comments)    Reaction:  GI upset   . Banana Diarrhea  . Metolazone Other (See Comments)    Patient states that he stopped taking due to "heart attack" like symptoms  . Oatmeal Nausea And Vomiting    Per pt, allergic to oatmeal.  . Orange Juice [Orange Oil] Nausea And Vomiting  . Torsemide Swelling and Other (See Comments)    Reaction:  Swelling of feet/legs   . Diltiazem Palpitations  . Hydralazine Palpitations   Procedures/Studies: Dg Chest Portable 1 View  Result Date: 08/18/2016 CLINICAL DATA:  Shortness of breath. EXAM: PORTABLE CHEST  1 VIEW COMPARISON:  Multiple prior exams most recently 08/13/2016 FINDINGS: Cardiomegaly is again seen. Vascular congestion. There is increased perihilar edema. Question of small pleural effusions versus habitus. No confluent airspace disease. IMPRESSION: CHF with increased perihilar edema from recent prior. Electronically Signed   By: Rubye Oaks M.D.   On: 08/18/2016 23:23   Dg Chest Port 1 View  Result Date: 08/14/2016 CLINICAL DATA:  Shortness of breath EXAM: PORTABLE CHEST 1 VIEW COMPARISON:  Chest radiograph 08/05/2016 FINDINGS: Cardiomediastinal silhouette remains massively enlarged. There is mild pulmonary edema and small bilateral pleural effusions. IMPRESSION: Congestive heart failure with mild pulmonary edema, small pleural effusions and cardiomegaly versus pericardial effusion. Electronically Signed   By: Deatra Robinson M.D.   On: 08/14/2016 00:19   Subjective: Pt says that he feels much better today and ready to go home.    Discharge Exam: Vitals:   08/20/16 2159 08/21/16 0516  BP: 119/66 (!) 140/102  Pulse: 65 80  Resp: 18 16  Temp: 98.3 F (36.8 C) 98.4 F (36.9 C)   Vitals:   08/20/16 1557 08/20/16 2159 08/21/16 0500 08/21/16 0516  BP: 107/67 119/66  (!) 140/102  Pulse: 68 65  80  Resp: 18 18  16   Temp: 98 F (36.7 C) 98.3 F (36.8 C)   98.4 F (36.9 C)  TempSrc: Oral Oral  Oral  SpO2: 94% 100%  95%  Weight: 133.8 kg (294 lb 14.4 oz)  131.8 kg (290 lb 8 oz)   Height:       General: Pt is alert, awake, not in acute distress Cardiovascular: nL S1/S2 +, no rubs, no gallops Respiratory: CTA bilaterally, no wheezing, no rhonchi Abdominal: Soft, NT, ND, bowel sounds + Extremities: 1+ edema, chronic venous stasis changes. Much improved from prior exams.  The results of significant diagnostics from this hospitalization (including imaging, microbiology, ancillary and laboratory) are listed below for reference.     Microbiology: No results found for this or any previous visit (from the past 240 hour(s)).   Labs: BNP (last 3 results)  Recent Labs  07/19/16 0753 08/13/16 2344 08/18/16 2255  BNP 808.0* 863.0* 1,083.0*   Basic Metabolic Panel:  Recent Labs Lab 08/18/16 2255 08/20/16 0603 08/21/16 0503  NA 137 141 140  K 3.4* 3.3* 3.4*  CL 106 103 102  CO2 25 31 29   GLUCOSE 92 85 82  BUN 19 20 23*  CREATININE 1.07 1.12 1.15  CALCIUM 8.5* 8.3* 8.5*   Liver Function Tests: No results for input(s): AST, ALT, ALKPHOS, BILITOT, PROT, ALBUMIN in the last 168 hours. No results for input(s): LIPASE, AMYLASE in the last 168 hours. No results for input(s): AMMONIA in the last 168 hours. CBC:  Recent Labs Lab 08/18/16 2255  WBC 4.4  NEUTROABS 3.1  HGB 10.0*  HCT 32.1*  MCV 80.7  PLT 223   Cardiac Enzymes:  Recent Labs Lab 08/18/16 2255  TROPONINI 0.03*   BNP: Invalid input(s): POCBNP CBG: No results for input(s): GLUCAP in the last 168 hours. D-Dimer No results for input(s): DDIMER in the last 72 hours. Hgb A1c No results for input(s): HGBA1C in the last 72 hours. Lipid Profile No results for input(s): CHOL, HDL, LDLCALC, TRIG, CHOLHDL, LDLDIRECT in the last 72 hours. Thyroid function studies No results for input(s): TSH, T4TOTAL, T3FREE, THYROIDAB in the last 72 hours.  Invalid input(s):  FREET3 Anemia work up No results for input(s): VITAMINB12, FOLATE, FERRITIN, TIBC, IRON, RETICCTPCT in the last 72 hours. Urinalysis  Component Value Date/Time   COLORURINE STRAW (A) 08/19/2016 0133   APPEARANCEUR CLEAR 08/19/2016 0133   LABSPEC 1.005 08/19/2016 0133   PHURINE 5.0 08/19/2016 0133   GLUCOSEU NEGATIVE 08/19/2016 0133   GLUCOSEU NEG mg/dL 99/77/4142 3953   HGBUR NEGATIVE 08/19/2016 0133   BILIRUBINUR NEGATIVE 08/19/2016 0133   KETONESUR NEGATIVE 08/19/2016 0133   PROTEINUR NEGATIVE 08/19/2016 0133   UROBILINOGEN 0.2 08/08/2014 1445   NITRITE NEGATIVE 08/19/2016 0133   LEUKOCYTESUR NEGATIVE 08/19/2016 0133   Sepsis Labs Invalid input(s): PROCALCITONIN,  WBC,  LACTICIDVEN Microbiology No results found for this or any previous visit (from the past 240 hour(s)).  Time coordinating discharge: Over 30 minutes  SIGNED:  Standley Dakins, MD  [facility] 08/21/2016, 9:48 AM Pager   If 7PM-7AM, please contact night-coverage [email or web address] Password [email or web address]

## 2016-08-21 NOTE — Discharge Instructions (Signed)
Heart Failure Heart failure means your heart has trouble pumping blood. This makes it hard for your body to work well. Heart failure is usually a long-term (chronic) condition. You must take good care of yourself and follow your doctor's treatment plan. Follow these instructions at home:  Take your heart medicine as told by your doctor.  Do not stop taking medicine unless your doctor tells you to.  Do not skip any dose of medicine.  Refill your medicines before they run out.  Take other medicines only as told by your doctor or pharmacist.  Stay active if told by your doctor. The elderly and people with severe heart failure should talk with a doctor about physical activity.  Eat heart-healthy foods. Choose foods that are without trans fat and are low in saturated fat, cholesterol, and salt (sodium). This includes fresh or frozen fruits and vegetables, fish, lean meats, fat-free or low-fat dairy foods, whole grains, and high-fiber foods. Lentils and dried peas and beans (legumes) are also good choices.  Limit salt if told by your doctor.  Cook in a healthy way. Roast, grill, broil, bake, poach, steam, or stir-fry foods.  Limit fluids as told by your doctor.  Weigh yourself every morning. Do this after you pee (urinate) and before you eat breakfast. Write down your weight to give to your doctor.  Take your blood pressure and write it down if your doctor tells you to.  Ask your doctor how to check your pulse. Check your pulse as told.  Lose weight if told by your doctor.  Stop smoking or chewing tobacco. Do not use gum or patches that help you quit without your doctor's approval.  Schedule and go to doctor visits as told.  Nonpregnant women should have no more than 1 drink a day. Men should have no more than 2 drinks a day. Talk to your doctor about drinking alcohol.  Stop illegal drug use.  Stay current with shots (immunizations).  Manage your health conditions as told by your  doctor.  Learn to manage your stress.  Rest when you are tired.  If it is really hot outside:  Avoid intense activities.  Use air conditioning or fans, or get in a cooler place.  Avoid caffeine and alcohol.  Wear loose-fitting, lightweight, and light-colored clothing.  If it is really cold outside:  Avoid intense activities.  Layer your clothing.  Wear mittens or gloves, a hat, and a scarf when going outside.  Avoid alcohol.  Learn about heart failure and get support as needed.  Get help to maintain or improve your quality of life and your ability to care for yourself as needed. Contact a doctor if:  You gain weight quickly.  You are more short of breath than usual.  You cannot do your normal activities.  You tire easily.  You cough more than normal, especially with activity.  You have any or more puffiness (swelling) in areas such as your hands, feet, ankles, or belly (abdomen).  You cannot sleep because it is hard to breathe.  You feel like your heart is beating fast (palpitations).  You get dizzy or light-headed when you stand up. Get help right away if:  You have trouble breathing.  There is a change in mental status, such as becoming less alert or not being able to focus.  You have chest pain or discomfort.  You faint. This information is not intended to replace advice given to you by your health care provider. Make sure you  discuss any questions you have with your health care provider. Document Released: 03/17/2008 Document Revised: 11/14/2015 Document Reviewed: 07/25/2012 Elsevier Interactive Patient Education  2017 [employer] Inc[employer]   Preventing Heart Failure Heart failure is a condition in which the heart has trouble pumping blood. This may mean that the heart cannot pump enough blood out to the body, or that the heart does not fill up with enough blood. Either of those problems can lead to symptoms such as fatigue, trouble breathing, and swelling  throughout the body. This is a common medical condition that affects not only the heart, but the entire body. Making certain nutrition and lifestyle changes can help you prevent heart failure and avoid serious health problems. What nutrition changes can be made?  If you are overweight or obese, reduce how many calories you eat each day so that you lose weight. Work with your health care provider or a diet and nutrition specialist (dietitian) to determine how many calories you need each day.  Eat foods that are low in salt (sodium). Avoid adding extra salt to foods.  Eat a well-balanced diet that includes a lot of:  Fresh fruits and vegetables.  Whole grains.  Lean meats.  Beans.  Fat-free or low-fat dairy products.  Avoid foods that contain a lot of:  Trans fats.  Saturated fats.  Sugar.  Cholesterol. What lifestyle changes can be made?  Do not use any products that contain nicotine or tobacco, such as cigarettes and e-cigarettes. If you need help quitting or reducing how much you smoke, ask your health care provider.  Stop using alcohol, or limit alcohol intake to no more than 1 drink a day for nonpregnant women and 2 drinks a day for men. One drink equals 12 oz of beer, 5 oz of wine, or 1 oz of hard liquor.  Exercise for at least 150 minutes each week, or as much as told by your health care provider.  Do moderate-intensity exercise, such as brisk walking, bicycling, or water aerobics.  Ask your health care provider which activities are safe for you.  See a health care provider regularly for screening and wellness checks. Know your heart health indicators, such as:  Blood pressure.  Cholesterol levels.  Blood sugar (glucose) levels.  Weight and BMI.  If you have diabetes, manage your condition and follow your treatment plan as instructed.  Try to get 7-9 hours of sleep each night. To help with sleep:  Keep your bedroom cool and dark.  Do not eat a heavy meal  during the hour before you go to bed.  Do not drink alcohol or caffeinated drinks before bed.  Avoid screen time before bedtime. This means avoiding television, computers, tablets, and cell phones.  Find ways to relax and manage stress. These may include:  Breathing exercises.  Meditation.  Yoga.  Listening to music. Why are these changes important?  A well-balanced diet with the appropriate amount of calories can keep your body weight at a healthy level, which reduces strain on your heart.  A low-sodium diet can help keep your blood pressure in a normal range and keep your blood vessels working properly.  Quitting smoking and limiting alcohol intake can reduce harmful effects that these substances have on your heart and blood vessels.  Regular exercise can keep your heart strong so it can pump blood normally.  Managing diabetes helps your blood circulate and can help you maintain a healthy weight.  Managing stress helps to reduce the risk of high  blood pressure and heart problems. What can happen if changes are not made? Heart failure can cause very serious problems that may get worse over time, such as:  Extreme fatigue during normal physical activities.  Shortness of breath or trouble breathing.  Swelling in your abdomen, legs, ankles, feet, or neck.  Fluid buildup throughout the body.  Weight gain.  Cough.  Frequent urination. What can I do to lower my risk? You may be able to lower your risk of heart failure by:  Losing weight or keeping your weight under control.  Working with your health care provider to manage your:  Cholesterol.  Blood pressure.  Diabetes, if this applies.  Eating a healthy diet.  Exercising regularly.  Avoiding unhealthy habits, such as smoking, drinking, or using drugs.  Getting plenty of sleep.  Managing your stress. How is this treated? Heart failure cannot be cured except by heart transplant, but treatment can help to  improve your quality of life. Treatment may include:  Medicines to help:  Lower blood pressure.  Remove excess sodium from your body.  Relax blood vessels.  Improve heart function.  Control other symptoms of heart failure.  Surgery to open blocked coronary arteries or repair damaged heart valves.  Implantation of a biventricular pacemaker to improve heart muscle function (cardiac resynchronization therapy). This device paces both the right ventricle and left ventricle.  Implantation of a device to treat serious abnormal heart rhythms (implantable cardioverter defibrillator, ICD).  Implantation of a mechanical heart pump to improve the pumping ability of your heart (left ventricular assist device, LVAD).  Heart transplant. This treatment is considered for certain people who do not improve with other treatments. Where to find more information:  National Heart, Lung, and Blood Institute: PowderCalcium.fr  Centers for Disease Control and Prevention: https://www.franklin-jones.com/  [facility]: https://www.bailey.com/  American Heart Association: 1234567890.jsp Contact a health care provider if:  You have rapid weight gain.  You have increasing shortness of breath that is unusual for you.  You tire easily, or you are unable to participate in your usual activities.  You cough more than normal, especially with physical activity.  You have any swelling or more swelling in areas such as your hands, feet, ankles, or abdomen. Summary  Heart failure can be prevented by making changes to your diet and your lifestyle.  It is important to eat a healthy diet, manage your weight, exercise regularly, manage stress, avoid drugs and alcohol, and keep your cholesterol and blood pressure under control.  Heart  failure can cause very serious problems over time. This information is not intended to replace advice given to you by your health care provider. Make sure you discuss any questions you have with your health care provider. Document Released: 01/28/2016 Document Revised: 01/28/2016 Document Reviewed: 01/28/2016 Elsevier Interactive Patient Education  2017 [employer].

## 2016-08-21 NOTE — Care Management Important Message (Signed)
Important Message  Patient Details  Name: [name] MRN: [id number] Date of Birth: Jun 01, 1966   Medicare Important Message Given:  Yes    [name], RN 08/21/2016, 9:35 AM

## 2016-08-21 NOTE — Progress Notes (Signed)
Discharge instructions read to patient Pt verbalized understanding of all instructions CHF instructions review Pt states he understands all CHF instructions.  Discharged to home with family

## 2016-08-30 ENCOUNTER — Encounter (HOSPITAL_COMMUNITY): Payer: Self-pay | Admitting: *Deleted

## 2016-08-30 ENCOUNTER — Emergency Department (HOSPITAL_COMMUNITY): Payer: Medicare Other

## 2016-08-30 ENCOUNTER — Emergency Department (HOSPITAL_COMMUNITY)
Admission: EM | Admit: 2016-08-30 | Discharge: 2016-08-31 | Disposition: A | Discharge status: Home / Self Care | Payer: Medicare Other | Attending: Emergency Medicine | Admitting: Emergency Medicine

## 2016-08-30 DIAGNOSIS — N182 Chronic kidney disease, stage 2 (mild): Secondary | ICD-10-CM | POA: Diagnosis not present

## 2016-08-30 DIAGNOSIS — I130 Hypertensive heart and chronic kidney disease with heart failure and stage 1 through stage 4 chronic kidney disease, or unspecified chronic kidney disease: Secondary | ICD-10-CM | POA: Diagnosis not present

## 2016-08-30 DIAGNOSIS — Z87891 Personal history of nicotine dependence: Secondary | ICD-10-CM | POA: Diagnosis not present

## 2016-08-30 DIAGNOSIS — J45909 Unspecified asthma, uncomplicated: Secondary | ICD-10-CM | POA: Insufficient documentation

## 2016-08-30 DIAGNOSIS — I5020 Unspecified systolic (congestive) heart failure: Secondary | ICD-10-CM | POA: Diagnosis not present

## 2016-08-30 DIAGNOSIS — I5023 Acute on chronic systolic (congestive) heart failure: Secondary | ICD-10-CM

## 2016-08-30 DIAGNOSIS — Z79899 Other long term (current) drug therapy: Secondary | ICD-10-CM | POA: Diagnosis not present

## 2016-08-30 DIAGNOSIS — R0602 Shortness of breath: Secondary | ICD-10-CM | POA: Diagnosis present

## 2016-08-30 LAB — CBC
HCT: 32.3 % — ABNORMAL LOW (ref 39.0–52.0)
HEMOGLOBIN: 10.2 g/dL — AB (ref 13.0–17.0)
MCH: 25.2 pg — ABNORMAL LOW (ref 26.0–34.0)
MCHC: 31.6 g/dL (ref 30.0–36.0)
MCV: 80 fL (ref 78.0–100.0)
PLATELETS: 262 10*3/uL (ref 150–400)
RBC: 4.04 MIL/uL — AB (ref 4.22–5.81)
RDW: 17.3 % — ABNORMAL HIGH (ref 11.5–15.5)
WBC: 3.9 10*3/uL — AB (ref 4.0–10.5)

## 2016-08-30 NOTE — ED Triage Notes (Signed)
Pt c/o sob that became worse yesterday, denies any pain, states " I am just tired"

## 2016-08-30 NOTE — ED Provider Notes (Signed)
[facility]-EMERGENCY DEPT Provider Note   CSN: [id number] Arrival date & time: 08/30/16  2316 By signing my name below, I, [name], attest that this documentation has been prepared under the direction and in the presence of [name], MD . Electronically Signed: Levon Fitzpatrick, Scribe. 08/30/2016. 11:58 PM.   History   Chief Complaint Chief Complaint  Patient presents with  . Shortness of Breath   HPI [name] is a 51 y.o. male with a history of asthma, A-fib and CHF who presents to the Emergency Department complaining of persistent, progressively worsening shortness of breath onset a few days ago. Per pt, shortness of breath is exacerbated by lying on his back. He reports associated  Increased chronic bilateral leg swelling and pain, dull chest pain and nausea. Pt states he is unable to bed his legs due to the swelling. He has taken his medications with no relief of symptoms. He was last evaluated in the ED for the same on 08/18/16 and was admitted for IV diuresis.  Pt has no other complaints or symptoms at this time.  The history is provided by the patient. No language interpreter was used.   Past Medical History:  Diagnosis Date  . Allergic rhinitis   . Asthma   . Atrial fibrillation (HCC)   . CHF (congestive heart failure) (HCC)   . Essential hypertension   . Gastroesophageal reflux disease   . Heart attack   . History of cardiac catheterization    Normal coronaries December 2016  . Noncompliance    Major problem leading to declining health and recurrent hospitalization  . Nonischemic cardiomyopathy (HCC)    LVEF 20-25%  . OSA (obstructive sleep apnea)   . Osteoarthritis   . Peptic ulcer disease     Patient Active Problem List   Diagnosis Date Noted  . Acute on chronic systolic CHF (congestive heart failure) (HCC) 08/19/2016  . Systolic CHF, acute on chronic (HCC) 08/19/2016  . Acute on chronic congestive heart failure (HCC) 07/19/2016  . Acute on chronic  combined systolic and diastolic CHF (congestive heart failure) (HCC) 07/19/2016  . CKD (chronic kidney disease), stage II 05/17/2016  . Coronary arteries, normal Dec 2016 01/23/2016  . Chronic anticoagulation-Xarelto 01/23/2016  . NSVT (nonsustained ventricular tachycardia) (HCC) 01/23/2016  . Elevated troponin 12/21/2015  . Nonischemic cardiomyopathy (HCC)   . Morbid obesity due to excess calories (HCC)   . Noncompliance with diet and medication regimen 12/02/2015  . Acute on chronic systolic congestive heart failure (HCC)   . Acute on chronic combined systolic and diastolic heart failure (HCC) 10/08/2015  . OSA (obstructive sleep apnea) 09/20/2015  . Pulmonary hypertension 09/19/2015  . Normocytic anemia 09/19/2015  . Chronic atrial fibrillation (HCC)   . Dyspnea on exertion 05/28/2015  . Acute respiratory failure with hypoxia (HCC) 04/01/2015  . Hypokalemia 04/01/2015  . Obesity hypoventilation syndrome (HCC) 08/08/2014  . GERD (gastroesophageal reflux disease) 08/08/2014  . Atrial fibrillation (HCC) 06/28/2014  . Edema of right lower extremity 06/21/2014  . Fecal urgency 06/21/2014  . Asthma 02/11/2009  . Hyperlipidemia 07/12/2008  . OBESITY, MORBID 07/12/2008  . Anxiety state 07/12/2008  . DEPRESSION 07/12/2008  . Essential hypertension 07/12/2008  . ALLERGIC RHINITIS 07/12/2008  . Peptic ulcer 07/12/2008  . OSTEOARTHRITIS 07/12/2008    Past Surgical History:  Procedure Laterality Date  . CARDIAC CATHETERIZATION N/A 06/14/2015   Procedure: Right/Left Heart Cath and Coronary Angiography;  Surgeon: Runell Gess, MD;  Location: [facility] INVASIVE CV LAB;  Service:  Cardiovascular;  Laterality: N/A;     Home Medications    Prior to Admission medications   Medication Sig Start Date End Date Taking? Authorizing Provider  atorvastatin (LIPITOR) 40 MG tablet Take 1 tablet (40 mg total) by mouth daily at 6 PM. 06/25/16   Kathlen Mody, MD  beclomethasone (QVAR) 80 MCG/ACT inhaler  Inhale 2 puffs into the lungs 2 (two) times daily as needed (for shortness of breath).    Historical Provider, MD  bumetanide (BUMEX) 2 MG tablet Take 1 tablet (2 mg total) by mouth 2 (two) times daily. 07/25/16   Catarina Hartshorn, MD  carvedilol (COREG) 25 MG tablet Take 1 tablet (25 mg total) by mouth 2 (two) times daily with a meal. 06/25/16   Kathlen Mody, MD  docusate sodium (COLACE) 100 MG capsule Take 1 capsule (100 mg total) by mouth 2 (two) times daily. 06/25/16   Kathlen Mody, MD  ferrous gluconate (FERGON) 324 MG tablet Take 1 tablet (324 mg total) by mouth 2 (two) times daily with a meal. 06/25/16   Kathlen Mody, MD  lisinopril (PRINIVIL,ZESTRIL) 5 MG tablet Take 1 tablet (5 mg total) by mouth daily. 06/25/16   Kathlen Mody, MD  OXYGEN Inhale 3 L into the lungs daily.    Historical Provider, MD  pantoprazole (PROTONIX) 40 MG tablet Take 1 tablet (40 mg total) by mouth daily at 6 (six) AM. 06/26/16   Kathlen Mody, MD  potassium chloride SA (K-DUR,KLOR-CON) 20 MEQ tablet Take 2 tablets (40 mEq total) by mouth daily. Patient taking differently: Take 20 mEq by mouth 2 (two) times daily.  06/25/16   Kathlen Mody, MD  rivaroxaban (XARELTO) 20 MG TABS tablet Take 1 tablet (20 mg total) by mouth daily with breakfast. Patient taking differently: Take 20 mg by mouth daily.  06/26/16   Kathlen Mody, MD  spironolactone (ALDACTONE) 50 MG tablet Take 1 tablet (50 mg total) by mouth daily. 06/25/16   Kathlen Mody, MD  tamsulosin (FLOMAX) 0.4 MG CAPS capsule Take 1 capsule (0.4 mg total) by mouth daily. 06/25/16   Kathlen Mody, MD  torsemide (DEMADEX) 20 MG tablet Take 20 mg by mouth 2 (two) times daily. 08/07/16   Historical Provider, MD    Family History Family History  Problem Relation Age of Onset  . Stroke Father   . Heart attack Father   . Aneurysm Mother     Cerebral aneurysm  . Hypertension Sister   . Colon cancer Neg Hx   . Inflammatory bowel disease Neg Hx   . Liver disease Neg Hx     Social History Social  History  Substance Use Topics  . Smoking status: Former Smoker    Packs/day: 0.50    Years: 20.00    Types: Cigarettes    Start date: 04/26/1988    Quit date: 06/23/2007  . Smokeless tobacco: Never Used     Comment: 1 ppd former smoker  . Alcohol use No     Comment: No etoh since 2009     Allergies   Bee venom; Food; Lipitor [atorvastatin]; Aspirin; Banana; Metolazone; Oatmeal; Orange juice [orange oil]; Torsemide; Diltiazem; and Hydralazine   Review of Systems Review of Systems  Constitutional: Negative for fever.  Respiratory: Positive for shortness of breath.   Cardiovascular: Positive for chest pain and leg swelling.  Gastrointestinal: Positive for nausea.  Musculoskeletal: Positive for myalgias.  All other systems reviewed and are negative.  Physical Exam Updated Vital Signs BP (!) 160/124   Pulse 104  Temp 98.3 F (36.8 C) (Oral)   Resp 26   Ht 6' (1.829 m)   Wt 300 lb (136.1 kg)   SpO2 99%   BMI 40.69 kg/m   Physical Exam CONSTITUTIONAL: Chronically ill appearing and appears older than staged age HEAD: Normocephalic/atraumatic EYES: Pt wearing sunglasses ENMT: Mucous membranes moist NECK: supple no meningeal signs. JVD noted.  SPINE/BACK:entire spine nontender CV: tachycardic and irregular.  LUNGS: Crackles bilaterally.  ABDOMEN: soft, nontender, no rebound or guarding, bowel sounds noted throughout abdomen GU:no cva tenderness NEURO: Pt is awake/alert/appropriate, moves all extremitiesx4.  No facial droop.   EXTREMITIES: pulses normal/equal, full ROM. Chronic pitting edema in lower extremities.  SKIN: warm, color normal PSYCH: no abnormalities of mood noted, alert and oriented to situation   ED Treatments / Results  DIAGNOSTIC STUDIES:  Oxygen Saturation is 99% on RA, normal by my interpretation.    COORDINATION OF CARE:  11:55 PM Discussed treatment plan with pt at bedside and pt agreed to plan.   Labs (all labs ordered are listed, but only  abnormal results are displayed) Labs Reviewed  BASIC METABOLIC PANEL - Abnormal; Notable for the following:       Result Value   Calcium 8.8 (*)    All other components within normal limits  CBC - Abnormal; Notable for the following:    WBC 3.9 (*)    RBC 4.04 (*)    Hemoglobin 10.2 (*)    HCT 32.3 (*)    MCH 25.2 (*)    RDW 17.3 (*)    All other components within normal limits  BRAIN NATRIURETIC PEPTIDE - Abnormal; Notable for the following:    B Natriuretic Peptide 868.0 (*)    All other components within normal limits  I-STAT TROPOININ, ED    EKG  EKG Interpretation  Date/Time:  Sunday August 30 2016 23:24:29 EDT Ventricular Rate:  100 PR Interval:    QRS Duration: 89 QT Interval:  367 QTC Calculation: 474 R Axis:   57 Text Interpretation:  Atrial fibrillation Nonspecific T abnormalities, lateral leads No significant change since last tracing Confirmed by Bebe Shaggy  MD, Milam Allbaugh ([id number]) on 08/30/2016 11:36:49 PM       Radiology Dg Chest 2 View  Result Date: 08/31/2016 CLINICAL DATA:  Initial evaluation for acute shortness of breath. EXAM: CHEST  2 VIEW COMPARISON:  Prior radiograph from 08/18/2016. FINDINGS: Moderate cardiomegaly, stable from previous. Mediastinal silhouette within normal limits. Lungs are normally inflated. Diffuse pulmonary vascular congestion with mild interstitial prominence, suggesting mild pulmonary interstitial edema. No pleural effusion. No superimposed focal infiltrates identified. No pneumothorax. No acute osseus abnormality. IMPRESSION: Cardiomegaly with mild diffuse pulmonary interstitial edema, compatible with CHF. Electronically Signed   By: Rise Mu M.D.   On: 08/31/2016 00:33    Procedures Procedures    Medications Ordered in ED Medications  furosemide (LASIX) injection 80 mg (80 mg Intravenous Given 08/31/16 0109)  nitroGLYCERIN (NITROGLYN) 2 % ointment 1 inch (1 inch Topical Given 08/31/16 0109)  acetaminophen (TYLENOL) tablet  650 mg (650 mg Oral Given 08/31/16 0215)  carvedilol (COREG) tablet 25 mg (25 mg Oral Given 08/31/16 0214)  lisinopril (PRINIVIL,ZESTRIL) tablet 5 mg (5 mg Oral Given 08/31/16 0214)  rivaroxaban (XARELTO) tablet 20 mg (20 mg Oral Given 08/31/16 0235)  furosemide (LASIX) injection 40 mg (40 mg Intravenous Given 08/31/16 0314)     Initial Impression / Assessment and Plan / ED Course  I have reviewed the triage vital signs and the nursing notes.  Pertinent labs & imaging results that were available during my care of the patient were reviewed by me and considered in my medical decision making (see chart for details).      1:42 AM Pt chronically ill  Apparently he is also chronically noncompliant He has h/o nonischemic cardiomyopathy Overall labs/imaging similar to prior He has had some weight gain However he is not distressed IV lasix given to patient Will monitor patient and reassess 3:27 AM Pt reports improvement He thinks another dose of IV lasix will help his symptoms and he would like to go home He reports having close cardiology f/u this week He has 2L urine output already 4:34 AM Pt improved He is sitting up and in no distress Heart rate improved His work of breathing is improved He has had significant urine output At this point, I feel he can be managed as outpatient.  He has had multiple admissions previously, and currently I don't feel inpatient management is warranted Encouraged him to continue all home meds Advised to call cardiology today for outpatient followup We also discussed strict ER return precautions   This patients CHA2DS2-VASc Score and unadjusted Ischemic Stroke Rate (% per year) is equal to 3.2 % stroke rate/year from a score of 3  Above score calculated as 1 point each if present [CHF, HTN, DM, Vascular=MI/PAD/Aortic Plaque, Age if 65-74, or Male] Above score calculated as 2 points each if present [Age > 75, or Stroke/TIA/TE]   Final Clinical  Impressions(s) / ED Diagnoses   Final diagnoses:  Acute on chronic systolic congestive heart failure (HCC)    New Prescriptions New Prescriptions   No medications on file  I personally performed the services described in this documentation, which was scribed in my presence. The recorded information has been reviewed and is accurate.        [name], MD 08/31/16 [phone]

## 2016-08-31 DIAGNOSIS — I130 Hypertensive heart and chronic kidney disease with heart failure and stage 1 through stage 4 chronic kidney disease, or unspecified chronic kidney disease: Secondary | ICD-10-CM | POA: Diagnosis not present

## 2016-08-31 LAB — BASIC METABOLIC PANEL
ANION GAP: 7 (ref 5–15)
BUN: 20 mg/dL (ref 6–20)
CALCIUM: 8.8 mg/dL — AB (ref 8.9–10.3)
CO2: 24 mmol/L (ref 22–32)
CREATININE: 1.23 mg/dL (ref 0.61–1.24)
Chloride: 111 mmol/L (ref 101–111)
GLUCOSE: 91 mg/dL (ref 65–99)
Potassium: 3.7 mmol/L (ref 3.5–5.1)
Sodium: 142 mmol/L (ref 135–145)

## 2016-08-31 LAB — BRAIN NATRIURETIC PEPTIDE: B NATRIURETIC PEPTIDE 5: 868 pg/mL — AB (ref 0.0–100.0)

## 2016-08-31 LAB — I-STAT TROPONIN, ED: Troponin i, poc: 0.01 ng/mL (ref 0.00–0.08)

## 2016-08-31 MED ORDER — RIVAROXABAN 10 MG PO TABS
ORAL_TABLET | ORAL | Status: AC
  Filled 2016-08-31: qty 2

## 2016-08-31 MED ORDER — ACETAMINOPHEN 325 MG PO TABS
650.0000 mg | ORAL_TABLET | Freq: Once | ORAL | Status: AC
  Administered 2016-08-31: 650 mg via ORAL
  Filled 2016-08-31: qty 2

## 2016-08-31 MED ORDER — FUROSEMIDE 10 MG/ML IJ SOLN
80.0000 mg | Freq: Once | INTRAMUSCULAR | Status: AC
  Administered 2016-08-31: 80 mg via INTRAVENOUS
  Filled 2016-08-31: qty 8

## 2016-08-31 MED ORDER — LISINOPRIL 5 MG PO TABS
5.0000 mg | ORAL_TABLET | Freq: Once | ORAL | Status: AC
  Administered 2016-08-31: 5 mg via ORAL
  Filled 2016-08-31: qty 1

## 2016-08-31 MED ORDER — RIVAROXABAN 20 MG PO TABS
20.0000 mg | ORAL_TABLET | Freq: Once | ORAL | Status: AC
  Administered 2016-08-31: 20 mg via ORAL
  Filled 2016-08-31: qty 1

## 2016-08-31 MED ORDER — FUROSEMIDE 10 MG/ML IJ SOLN
40.0000 mg | Freq: Once | INTRAMUSCULAR | Status: AC
  Administered 2016-08-31: 40 mg via INTRAVENOUS
  Filled 2016-08-31: qty 4

## 2016-08-31 MED ORDER — NITROGLYCERIN 2 % TD OINT
1.0000 [in_us] | TOPICAL_OINTMENT | Freq: Once | TRANSDERMAL | Status: AC
  Administered 2016-08-31: 1 [in_us] via TOPICAL
  Filled 2016-08-31: qty 1

## 2016-08-31 MED ORDER — CARVEDILOL 12.5 MG PO TABS
25.0000 mg | ORAL_TABLET | Freq: Once | ORAL | Status: AC
  Administered 2016-08-31: 25 mg via ORAL
  Filled 2016-08-31: qty 2

## 2016-09-03 ENCOUNTER — Emergency Department (HOSPITAL_COMMUNITY): Payer: Medicare Other

## 2016-09-03 ENCOUNTER — Emergency Department (HOSPITAL_COMMUNITY)
Admission: EM | Admit: 2016-09-03 | Discharge: 2016-09-03 | Disposition: A | Discharge status: Home / Self Care | Payer: Medicare Other | Attending: Emergency Medicine | Admitting: Emergency Medicine

## 2016-09-03 ENCOUNTER — Encounter (HOSPITAL_COMMUNITY): Payer: Self-pay | Admitting: *Deleted

## 2016-09-03 DIAGNOSIS — I509 Heart failure, unspecified: Secondary | ICD-10-CM | POA: Insufficient documentation

## 2016-09-03 DIAGNOSIS — Z9119 Patient's noncompliance with other medical treatment and regimen: Secondary | ICD-10-CM

## 2016-09-03 DIAGNOSIS — Z9114 Patient's other noncompliance with medication regimen: Secondary | ICD-10-CM | POA: Diagnosis not present

## 2016-09-03 DIAGNOSIS — I130 Hypertensive heart and chronic kidney disease with heart failure and stage 1 through stage 4 chronic kidney disease, or unspecified chronic kidney disease: Secondary | ICD-10-CM | POA: Insufficient documentation

## 2016-09-03 DIAGNOSIS — Z87891 Personal history of nicotine dependence: Secondary | ICD-10-CM | POA: Diagnosis not present

## 2016-09-03 DIAGNOSIS — M7989 Other specified soft tissue disorders: Secondary | ICD-10-CM | POA: Diagnosis present

## 2016-09-03 DIAGNOSIS — Z79899 Other long term (current) drug therapy: Secondary | ICD-10-CM | POA: Diagnosis not present

## 2016-09-03 DIAGNOSIS — N182 Chronic kidney disease, stage 2 (mild): Secondary | ICD-10-CM | POA: Insufficient documentation

## 2016-09-03 DIAGNOSIS — J45909 Unspecified asthma, uncomplicated: Secondary | ICD-10-CM | POA: Insufficient documentation

## 2016-09-03 DIAGNOSIS — R609 Edema, unspecified: Secondary | ICD-10-CM | POA: Insufficient documentation

## 2016-09-03 DIAGNOSIS — Z91199 Patient's noncompliance with other medical treatment and regimen due to unspecified reason: Secondary | ICD-10-CM

## 2016-09-03 LAB — BASIC METABOLIC PANEL
ANION GAP: 6 (ref 5–15)
BUN: 18 mg/dL (ref 6–20)
CO2: 29 mmol/L (ref 22–32)
Calcium: 8.8 mg/dL — ABNORMAL LOW (ref 8.9–10.3)
Chloride: 103 mmol/L (ref 101–111)
Creatinine, Ser: 1.14 mg/dL (ref 0.61–1.24)
GFR calc non Af Amer: >60 mL/min (ref >60)
GLUCOSE: 76 mg/dL (ref 65–99)
POTASSIUM: 3.2 mmol/L — AB (ref 3.5–5.1)
Sodium: 138 mmol/L (ref 135–145)

## 2016-09-03 LAB — CBC WITH DIFFERENTIAL/PLATELET
BASOS ABS: 0 10*3/uL (ref 0.0–0.1)
Basophils Relative: 0 %
EOS ABS: 0.1 10*3/uL (ref 0.0–0.7)
EOS PCT: 3 %
HCT: 33.6 % — ABNORMAL LOW (ref 39.0–52.0)
HEMOGLOBIN: 10.5 g/dL — AB (ref 13.0–17.0)
LYMPHS ABS: 1 10*3/uL (ref 0.7–4.0)
LYMPHS PCT: 28 %
MCH: 25.2 pg — ABNORMAL LOW (ref 26.0–34.0)
MCHC: 31.3 g/dL (ref 30.0–36.0)
MCV: 80.6 fL (ref 78.0–100.0)
Monocytes Absolute: 0.4 10*3/uL (ref 0.1–1.0)
Monocytes Relative: 11 %
NEUTROS PCT: 58 %
Neutro Abs: 2.1 10*3/uL (ref 1.7–7.7)
Platelets: 272 10*3/uL (ref 150–400)
RBC: 4.17 MIL/uL — AB (ref 4.22–5.81)
RDW: 17.5 % — AB (ref 11.5–15.5)
WBC: 3.6 10*3/uL — AB (ref 4.0–10.5)

## 2016-09-03 LAB — TROPONIN I: TROPONIN I: 0.03 ng/mL — AB (ref <0.03)

## 2016-09-03 LAB — BRAIN NATRIURETIC PEPTIDE: B Natriuretic Peptide: 561 pg/mL — ABNORMAL HIGH (ref 0.0–100.0)

## 2016-09-03 MED ORDER — RIVAROXABAN 20 MG PO TABS
20.0000 mg | ORAL_TABLET | Freq: Once | ORAL | Status: AC
  Administered 2016-09-03: 20 mg via ORAL
  Filled 2016-09-03: qty 1

## 2016-09-03 MED ORDER — CARVEDILOL 12.5 MG PO TABS
25.0000 mg | ORAL_TABLET | Freq: Two times a day (BID) | ORAL | Status: DC
  Administered 2016-09-03: 25 mg via ORAL
  Filled 2016-09-03: qty 2

## 2016-09-03 MED ORDER — FUROSEMIDE 10 MG/ML IJ SOLN
80.0000 mg | Freq: Once | INTRAMUSCULAR | Status: AC
  Administered 2016-09-03: 80 mg via INTRAVENOUS
  Filled 2016-09-03: qty 8

## 2016-09-03 MED ORDER — POTASSIUM CHLORIDE CRYS ER 20 MEQ PO TBCR
40.0000 meq | EXTENDED_RELEASE_TABLET | Freq: Once | ORAL | Status: AC
  Administered 2016-09-03: 40 meq via ORAL
  Filled 2016-09-03: qty 2

## 2016-09-03 MED ORDER — LISINOPRIL 5 MG PO TABS
5.0000 mg | ORAL_TABLET | Freq: Once | ORAL | Status: AC
  Administered 2016-09-03: 5 mg via ORAL
  Filled 2016-09-03: qty 1

## 2016-09-03 NOTE — ED Provider Notes (Signed)
[facility]Y DEPT Provider Note   CSN: [id number] Arrival date & time: 09/03/16  0906     History   Chief Complaint Chief Complaint  Patient presents with  . Leg Swelling    HPI [name] is a 51 y.o. male.     Pt was seen at 0920. Per pt, c/o gradual onset and persistence of constant "legs swelling" for the past several days. Pt is well known to the ED and is non-compliant with his diet and meds. States he "ate a bunch of tater tots this morning." Pt states he had his meds when he was at [facility] ED 1 to 2 days ago. Pt does not take his meds at home "because they make my legs swell up." Denies CP/palpitations, no cough, no fevers, no abd pain, no N/V/D, no back pain.  The symptoms have been associated with no other complaints. The patient has a significant history of similar symptoms previously, recently being evaluated for this complaint and multiple prior evals for same.     Past Medical History:  Diagnosis Date  . Allergic rhinitis   . Asthma   . Atrial fibrillation (HCC)   . CHF (congestive heart failure) (HCC)   . Essential hypertension   . Gastroesophageal reflux disease   . Heart attack   . History of cardiac catheterization    Normal coronaries December 2016  . Noncompliance    Major problem leading to declining health and recurrent hospitalization  . Nonischemic cardiomyopathy (HCC)    LVEF 20-25%  . OSA (obstructive sleep apnea)   . Osteoarthritis   . Peptic ulcer disease     Patient Active Problem List   Diagnosis Date Noted  . Acute on chronic systolic CHF (congestive heart failure) (HCC) 08/19/2016  . Systolic CHF, acute on chronic (HCC) 08/19/2016  . Acute on chronic congestive heart failure (HCC) 07/19/2016  . Acute on chronic combined systolic and diastolic CHF (congestive heart failure) (HCC) 07/19/2016  . CKD (chronic kidney disease), stage II 05/17/2016  . Coronary arteries, normal Dec 2016 01/23/2016  . Chronic anticoagulation-Xarelto  01/23/2016  . NSVT (nonsustained ventricular tachycardia) (HCC) 01/23/2016  . Elevated troponin 12/21/2015  . Nonischemic cardiomyopathy (HCC)   . Morbid obesity due to excess calories (HCC)   . Noncompliance with diet and medication regimen 12/02/2015  . Acute on chronic systolic congestive heart failure (HCC)   . Acute on chronic combined systolic and diastolic heart failure (HCC) 10/08/2015  . OSA (obstructive sleep apnea) 09/20/2015  . Pulmonary hypertension 09/19/2015  . Normocytic anemia 09/19/2015  . Chronic atrial fibrillation (HCC)   . Dyspnea on exertion 05/28/2015  . Acute respiratory failure with hypoxia (HCC) 04/01/2015  . Hypokalemia 04/01/2015  . Obesity hypoventilation syndrome (HCC) 08/08/2014  . GERD (gastroesophageal reflux disease) 08/08/2014  . Atrial fibrillation (HCC) 06/28/2014  . Edema of right lower extremity 06/21/2014  . Fecal urgency 06/21/2014  . Asthma 02/11/2009  . Hyperlipidemia 07/12/2008  . OBESITY, MORBID 07/12/2008  . Anxiety state 07/12/2008  . DEPRESSION 07/12/2008  . Essential hypertension 07/12/2008  . ALLERGIC RHINITIS 07/12/2008  . Peptic ulcer 07/12/2008  . OSTEOARTHRITIS 07/12/2008    Past Surgical History:  Procedure Laterality Date  . CARDIAC CATHETERIZATION N/A 06/14/2015   Procedure: Right/Left Heart Cath and Coronary Angiography;  Surgeon: Runell Gess, MD;  Location: [facility] INVASIVE CV LAB;  Service: Cardiovascular;  Laterality: N/A;       Home Medications    Prior to Admission medications   Medication Sig  Start Date End Date Taking? Authorizing Provider  atorvastatin (LIPITOR) 40 MG tablet Take 1 tablet (40 mg total) by mouth daily at 6 PM. 06/25/16  Yes Kathlen Mody, MD  beclomethasone (QVAR) 80 MCG/ACT inhaler Inhale 2 puffs into the lungs 2 (two) times daily as needed (for shortness of breath).   Yes Historical Provider, MD  bumetanide (BUMEX) 2 MG tablet Take 1 tablet (2 mg total) by mouth 2 (two) times daily. 07/25/16   Yes Catarina Hartshorn, MD  carvedilol (COREG) 25 MG tablet Take 1 tablet (25 mg total) by mouth 2 (two) times daily with a meal. 06/25/16  Yes Kathlen Mody, MD  docusate sodium (COLACE) 100 MG capsule Take 1 capsule (100 mg total) by mouth 2 (two) times daily. 06/25/16  Yes Kathlen Mody, MD  ferrous gluconate (FERGON) 324 MG tablet Take 1 tablet (324 mg total) by mouth 2 (two) times daily with a meal. 06/25/16  Yes Kathlen Mody, MD  furosemide (LASIX) 40 MG tablet TAKE 1 TABLET (40 MG TOTAL) BY MOUTH 2 (TWO) TIMES DAILY. 06/25/16  Yes Historical Provider, MD  OXYGEN Inhale 3 L into the lungs daily.   Yes Historical Provider, MD  pantoprazole (PROTONIX) 40 MG tablet Take 1 tablet (40 mg total) by mouth daily at 6 (six) AM. 06/26/16  Yes Kathlen Mody, MD  potassium chloride SA (K-DUR,KLOR-CON) 20 MEQ tablet Take 2 tablets (40 mEq total) by mouth daily. Patient taking differently: Take 20 mEq by mouth 2 (two) times daily.  06/25/16  Yes Kathlen Mody, MD  rivaroxaban (XARELTO) 20 MG TABS tablet Take 1 tablet (20 mg total) by mouth daily with breakfast. Patient taking differently: Take 20 mg by mouth daily.  06/26/16  Yes Kathlen Mody, MD  tamsulosin (FLOMAX) 0.4 MG CAPS capsule Take 1 capsule (0.4 mg total) by mouth daily. 06/25/16  Yes Kathlen Mody, MD  torsemide (DEMADEX) 20 MG tablet Take 20 mg by mouth 2 (two) times daily. 08/07/16  Yes Historical Provider, MD  lisinopril (PRINIVIL,ZESTRIL) 5 MG tablet Take 1 tablet (5 mg total) by mouth daily. Patient not taking: Reported on 09/03/2016 06/25/16   Kathlen Mody, MD  spironolactone (ALDACTONE) 50 MG tablet Take 1 tablet (50 mg total) by mouth daily. Patient not taking: Reported on 09/03/2016 06/25/16   Kathlen Mody, MD    Family History Family History  Problem Relation Age of Onset  . Stroke Father   . Heart attack Father   . Aneurysm Mother     Cerebral aneurysm  . Hypertension Sister   . Colon cancer Neg Hx   . Inflammatory bowel disease Neg Hx   . Liver disease Neg  Hx     Social History Social History  Substance Use Topics  . Smoking status: Former Smoker    Packs/day: 0.50    Years: 20.00    Types: Cigarettes    Start date: 04/26/1988    Quit date: 06/23/2007  . Smokeless tobacco: Never Used     Comment: 1 ppd former smoker  . Alcohol use No     Comment: No etoh since 2009     Allergies   Bee venom; Food; Lipitor [atorvastatin]; Aspirin; Banana; Metolazone; Oatmeal; Orange juice [orange oil]; Torsemide; Diltiazem; and Hydralazine   Review of Systems Review of Systems ROS: Statement: All systems negative except as marked or noted in the HPI; Constitutional: Negative for fever and chills. ; ; Eyes: Negative for eye pain, redness and discharge. ; ; ENMT: Negative for ear pain, hoarseness, nasal  congestion, sinus pressure and sore throat. ; ; Cardiovascular: Negative for chest pain, palpitations, diaphoresis.  +dyspnea and peripheral edema. ; ; Respiratory: Negative for cough, wheezing and stridor. ; ; Gastrointestinal: Negative for nausea, vomiting, diarrhea, abdominal pain, blood in stool, hematemesis, jaundice and rectal bleeding. . ; ; Genitourinary: Negative for dysuria, flank pain and hematuria. ; ; Musculoskeletal: Negative for back pain and neck pain. Negative for swelling and trauma.; ; Skin: Negative for pruritus, rash, abrasions, blisters, bruising and skin lesion.; ; Neuro: Negative for headache, lightheadedness and neck stiffness. Negative for weakness, altered level of consciousness, altered mental status, extremity weakness, paresthesias, involuntary movement, seizure and syncope.     Physical Exam Updated Vital Signs BP (!) 154/109   Pulse 105   Temp 98 F (36.7 C) (Oral)   Resp 17   Ht 6' (1.829 m)   Wt 300 lb (136.1 kg)   SpO2 100%   BMI 40.69 kg/m   Patient Vitals for the past 24 hrs:  BP Temp Temp src Pulse Resp SpO2 Height Weight  09/03/16 1200 165/90 - - 120 18 100 % - -  09/03/16 1130 (!) 154/109 - - 105 17 100 % - -   09/03/16 0912 132/100 98 F (36.7 C) Oral 102 22 100 % - -  09/03/16 0910 - - - - - - 6' (1.829 m) 300 lb (136.1 kg)      Physical Exam 0925; Physical examination:  Nursing notes reviewed; Vital signs and O2 SAT reviewed;  Constitutional: Well developed, Well nourished, Well hydrated, In no acute distress; Head:  Normocephalic, atraumatic; Eyes: EOMI, PERRL, No scleral icterus; ENMT: Mouth and pharynx normal, Mucous membranes moist; Neck: Supple, Full range of motion, No lymphadenopathy; Cardiovascular: Irregular rate and rhythm, No gallop; Respiratory: Breath sounds decreased & equal bilaterally, No wheezes.  Speaking full sentences with ease, Normal respiratory effort/excursion; Chest: Nontender, Movement normal; Abdomen: Soft, Nontender, Nondistended, Normal bowel sounds; Genitourinary: No CVA tenderness; Extremities: Pulses normal, No tenderness, +2-3 pedal edema bilat. No calf asymmetry.; Neuro: AA&Ox3, Major CN grossly intact.  Speech clear. No gross focal motor or sensory deficits in extremities.; Skin: Color normal, Warm, Dry.   ED Treatments / Results  Labs (all labs ordered are listed, but only abnormal results are displayed)   EKG  EKG Interpretation  Date/Time:  Thursday September 03 2016 09:11:39 EDT Ventricular Rate:  94 PR Interval:    QRS Duration: 90 QT Interval:  383 QTC Calculation: 479 R Axis:   64 Text Interpretation:  Atrial fibrillation Paired ventricular premature complexes Nonspecific T abnormalities, lateral leads Borderline prolonged QT interval When compared with ECG of 08/30/2016 No significant change was found Confirmed by [facility]  MD, Nicholos Johns [phone]) on 09/03/2016 9:36:15 AM       Radiology   Procedures Procedures (including critical care time)  Medications Ordered in ED Medications  carvedilol (COREG) tablet 25 mg (25 mg Oral Given 09/03/16 0953)  furosemide (LASIX) injection 80 mg (80 mg Intravenous Given 09/03/16 0954)  rivaroxaban (XARELTO)  tablet 20 mg (20 mg Oral Given 09/03/16 1001)  lisinopril (PRINIVIL,ZESTRIL) tablet 5 mg (5 mg Oral Given 09/03/16 0954)  potassium chloride SA (K-DUR,KLOR-CON) CR tablet 40 mEq (40 mEq Oral Given 09/03/16 1102)     Initial Impression / Assessment and Plan / ED Course  I have reviewed the triage vital signs and the nursing notes.  Pertinent labs & imaging results that were available during my care of the patient were reviewed by me and considered  in my medical decision making (see chart for details).  MDM Reviewed: previous chart, nursing note and vitals Reviewed previous: labs and ECG Interpretation: labs, ECG and x-ray   Results for orders placed or performed during the hospital encounter of 09/03/16  Basic metabolic panel  Result Value Ref Range   Sodium 138 135 - 145 mmol/L   Potassium 3.2 (L) 3.5 - 5.1 mmol/L   Chloride 103 101 - 111 mmol/L   CO2 29 22 - 32 mmol/L   Glucose, Bld 76 65 - 99 mg/dL   BUN 18 6 - 20 mg/dL   Creatinine, Ser 1.61 0.61 - 1.24 mg/dL   Calcium 8.8 (L) 8.9 - 10.3 mg/dL   GFR calc non Af Amer >60 >60 mL/min   GFR calc Af Amer >60 >60 mL/min   Anion gap 6 5 - 15  Brain natriuretic peptide  Result Value Ref Range   B Natriuretic Peptide 561.0 (H) 0.0 - 100.0 pg/mL  Troponin I  Result Value Ref Range   Troponin I 0.03 (HH) <0.03 ng/mL  CBC with Differential  Result Value Ref Range   WBC 3.6 (L) 4.0 - 10.5 K/uL   RBC 4.17 (L) 4.22 - 5.81 MIL/uL   Hemoglobin 10.5 (L) 13.0 - 17.0 g/dL   HCT 09.6 (L) 04.5 - 40.9 %   MCV 80.6 78.0 - 100.0 fL   MCH 25.2 (L) 26.0 - 34.0 pg   MCHC 31.3 30.0 - 36.0 g/dL   RDW 81.1 (H) 91.4 - 78.2 %   Platelets 272 150 - 400 K/uL   Neutrophils Relative % 58 %   Neutro Abs 2.1 1.7 - 7.7 K/uL   Lymphocytes Relative 28 %   Lymphs Abs 1.0 0.7 - 4.0 K/uL   Monocytes Relative 11 %   Monocytes Absolute 0.4 0.1 - 1.0 K/uL   Eosinophils Relative 3 %   Eosinophils Absolute 0.1 0.0 - 0.7 K/uL   Basophils Relative 0 %    Basophils Absolute 0.0 0.0 - 0.1 K/uL   Dg Chest 2 View Result Date: 09/03/2016 CLINICAL DATA:  Peripheral edema, shortness of breath for the past 3-4 days. History of cardiomyopathy, asthma, obesity, medication noncompliance, former smoker. EXAM: CHEST  2 VIEW COMPARISON:  Portable chest x-ray of September 01, 2016 FINDINGS: The lungs are reasonably well inflated. The interstitial markings are increased. The pulmonary vascularity is engorged. The cardiac silhouette is enlarged. The trachea is midline. The bony thorax exhibits no acute abnormality. IMPRESSION: CHF with pulmonary interstitial edema. No alveolar infiltrate nor pleural effusion. Electronically Signed   By: David  [place] M.D.   On: 09/03/2016 09:53    Results for JOSEDE, CICERO (MRN [id number]) as of 09/03/2016 12:09  Ref. Range 07/19/2016 07:53 08/13/2016 23:44 08/18/2016 22:55 08/30/2016 23:31 09/03/2016 09:33  B Natriuretic Peptide Latest Ref Range: 0.0 - 100.0 pg/mL 808.0 (H) 863.0 (H) 1,083.0 (H) 868.0 (H) 561.0 (H)     1215:  Pt given his usual BP meds while in the ED, as well as IV lasix 80mg . Pt has close to 3L output. Pt continues mildly tachycardic at rest. Pt ambulated with his usual baseline gait (small stepped, staggering): HR increasing to 127, though Sats remained 98% on his usual O2 N/C. Denied CP/palpitations.  BNP much better than his baseline.  T/C to Cards Dr. Purvis Sheffield, case discussed, including:  HPI, pertinent PM/SHx, VS/PE, dx testing, ED course and treatment:  Knows pt well, pt is chronically non-compliant and VS will be a challenge to get  much improved in the ED (pt needs his meds regularly over a period of time), he agrees PO meds are at max, strongly encourage pt to take his meds and he can f/u as outpatient. Dx and testing d/w pt.  Questions answered.  Verb understanding, agreeable to d/c home with outpt f/u.    Final Clinical Impressions(s) / ED Diagnoses   Final diagnoses:  None    New Prescriptions New  Prescriptions   No medications on file      Samuel Jester, DO 09/06/16 1543

## 2016-09-03 NOTE — ED Notes (Signed)
CRITICAL VALUE ALERT  Critical value received:  Troponin 0.03  Date of notification:  09/03/16  Time of notification:  1009  Critical value read back:Yes.    Nurse who received alert:  Fransisco Messmer M. Dareen Piano, RN  MD notified (1st page):  Dr Clarene Duke  Time of first page:  1010

## 2016-09-03 NOTE — ED Notes (Signed)
Pt reports he wears 3L [place] at all times at home. Pt walked with 3L with short, staggering steps. Increase work of breathing noted, HR was 127, O2 sat remained 98%.

## 2016-09-03 NOTE — Discharge Instructions (Signed)
Take your prescriptions as previously directed.  Call your regular medical doctor and your Cardiologist today to schedule a follow up appointment within the next 1 to 2 days.  Return to the Emergency Department immediately sooner if worsening.

## 2016-09-03 NOTE — ED Notes (Signed)
Attempted to get pt in wheelchair with O2 provided, pt states "Nah, I don't need it". Again attempted to provide pt with wheelchair and O2, denied again.

## 2016-09-03 NOTE — ED Triage Notes (Signed)
Pt brought in by [facility] with c/o bilateral lower leg edema and SOB x 2 days. Pt hypertensive for EMS 211/119, CBG 92. Pt reports he has been taking his Bumex and Demadex as prescribed.

## 2016-09-10 ENCOUNTER — Emergency Department (HOSPITAL_COMMUNITY): Payer: Medicare Other

## 2016-09-10 ENCOUNTER — Emergency Department (HOSPITAL_COMMUNITY)
Admission: EM | Admit: 2016-09-10 | Discharge: 2016-09-10 | Disposition: A | Discharge status: Home / Self Care | Payer: Medicare Other | Attending: Emergency Medicine | Admitting: Emergency Medicine

## 2016-09-10 ENCOUNTER — Encounter (HOSPITAL_COMMUNITY): Payer: Self-pay | Admitting: *Deleted

## 2016-09-10 DIAGNOSIS — K4090 Unilateral inguinal hernia, without obstruction or gangrene, not specified as recurrent: Secondary | ICD-10-CM | POA: Insufficient documentation

## 2016-09-10 DIAGNOSIS — Z79899 Other long term (current) drug therapy: Secondary | ICD-10-CM | POA: Insufficient documentation

## 2016-09-10 DIAGNOSIS — R600 Localized edema: Secondary | ICD-10-CM | POA: Diagnosis not present

## 2016-09-10 DIAGNOSIS — Z87891 Personal history of nicotine dependence: Secondary | ICD-10-CM | POA: Insufficient documentation

## 2016-09-10 DIAGNOSIS — I5043 Acute on chronic combined systolic (congestive) and diastolic (congestive) heart failure: Secondary | ICD-10-CM | POA: Diagnosis not present

## 2016-09-10 DIAGNOSIS — I130 Hypertensive heart and chronic kidney disease with heart failure and stage 1 through stage 4 chronic kidney disease, or unspecified chronic kidney disease: Secondary | ICD-10-CM | POA: Insufficient documentation

## 2016-09-10 DIAGNOSIS — R1032 Left lower quadrant pain: Secondary | ICD-10-CM

## 2016-09-10 DIAGNOSIS — N182 Chronic kidney disease, stage 2 (mild): Secondary | ICD-10-CM | POA: Insufficient documentation

## 2016-09-10 DIAGNOSIS — J45909 Unspecified asthma, uncomplicated: Secondary | ICD-10-CM | POA: Diagnosis not present

## 2016-09-10 LAB — COMPREHENSIVE METABOLIC PANEL
ALT: 27 U/L (ref 17–63)
ANION GAP: 8 (ref 5–15)
AST: 29 U/L (ref 15–41)
Albumin: 3.5 g/dL (ref 3.5–5.0)
Alkaline Phosphatase: 88 U/L (ref 38–126)
BUN: 19 mg/dL (ref 6–20)
CHLORIDE: 104 mmol/L (ref 101–111)
CO2: 27 mmol/L (ref 22–32)
Calcium: 8.8 mg/dL — ABNORMAL LOW (ref 8.9–10.3)
Creatinine, Ser: 1 mg/dL (ref 0.61–1.24)
Glucose, Bld: 91 mg/dL (ref 65–99)
POTASSIUM: 3.7 mmol/L (ref 3.5–5.1)
SODIUM: 139 mmol/L (ref 135–145)
Total Bilirubin: 1.2 mg/dL (ref 0.3–1.2)
Total Protein: 6.8 g/dL (ref 6.5–8.1)

## 2016-09-10 LAB — LIPASE, BLOOD: LIPASE: 23 U/L (ref 11–51)

## 2016-09-10 LAB — CBC
HEMATOCRIT: 34.7 % — AB (ref 39.0–52.0)
HEMOGLOBIN: 10.9 g/dL — AB (ref 13.0–17.0)
MCH: 25.2 pg — ABNORMAL LOW (ref 26.0–34.0)
MCHC: 31.4 g/dL (ref 30.0–36.0)
MCV: 80.3 fL (ref 78.0–100.0)
Platelets: 228 10*3/uL (ref 150–400)
RBC: 4.32 MIL/uL (ref 4.22–5.81)
RDW: 17 % — ABNORMAL HIGH (ref 11.5–15.5)
WBC: 3.3 10*3/uL — AB (ref 4.0–10.5)

## 2016-09-10 LAB — BRAIN NATRIURETIC PEPTIDE: B NATRIURETIC PEPTIDE 5: 767 pg/mL — AB (ref 0.0–100.0)

## 2016-09-10 MED ORDER — IOPAMIDOL (ISOVUE-300) INJECTION 61%
100.0000 mL | Freq: Once | INTRAVENOUS | Status: AC | PRN
  Administered 2016-09-10: 100 mL via INTRAVENOUS

## 2016-09-10 MED ORDER — ONDANSETRON HCL 4 MG/2ML IJ SOLN
4.0000 mg | Freq: Once | INTRAMUSCULAR | Status: AC
  Administered 2016-09-10: 4 mg via INTRAVENOUS
  Filled 2016-09-10: qty 2

## 2016-09-10 MED ORDER — HYDROMORPHONE HCL 1 MG/ML IJ SOLN
1.0000 mg | Freq: Once | INTRAMUSCULAR | Status: AC
  Administered 2016-09-10: 1 mg via INTRAVENOUS
  Filled 2016-09-10: qty 1

## 2016-09-10 MED ORDER — TRAMADOL HCL 50 MG PO TABS: 50.0000 mg | ORAL_TABLET | Freq: Four times a day (QID) | ORAL | 0 refills | Status: DC | PRN

## 2016-09-10 MED ORDER — CARVEDILOL 12.5 MG PO TABS
25.0000 mg | ORAL_TABLET | Freq: Once | ORAL | Status: AC
  Administered 2016-09-10: 25 mg via ORAL
  Filled 2016-09-10 (×2): qty 2

## 2016-09-10 MED ORDER — IOPAMIDOL (ISOVUE-300) INJECTION 61%
INTRAVENOUS | Status: AC
  Administered 2016-09-10: 30 mL via ORAL
  Filled 2016-09-10: qty 30

## 2016-09-10 NOTE — ED Triage Notes (Signed)
Pt comes in by EMS for LLQ pain starting 1 month ago. Pt states pain is constant. Denies any n/v/d. Pt LBM was last night. NAD noted.

## 2016-09-10 NOTE — ED Notes (Signed)
ED Provider at bedside. 

## 2016-09-10 NOTE — Discharge Instructions (Signed)
Take the tramadol as needed for pain. May complement the follow-up with general surgery. Give Dr. Lovell Sheehan office a call. As we discussed based on years severe congestive heart failure may not be a candidate for repair of the left inguinal hernia. Return for any new or worse symptoms.

## 2016-09-10 NOTE — ED Provider Notes (Signed)
[facility] DEPT Provider Note   CSN: [id number] Arrival date & time: 09/10/16  1651     History   Chief Complaint Chief Complaint  Patient presents with  . Abdominal Pain    HPI [name] is a 51 y.o. male.  She with known history of bad congestive heart failure. Blood presents today with a complaint of left lower quadrant abdominal pain it's actually been present for about 3 months certainly worse in the last month. Not associated with nausea vomiting or diarrhea. No fevers.      Past Medical History:  Diagnosis Date  . Allergic rhinitis   . Asthma   . Atrial fibrillation (HCC)   . CHF (congestive heart failure) (HCC)   . Essential hypertension   . Gastroesophageal reflux disease   . Heart attack   . History of cardiac catheterization    Normal coronaries December 2016  . Noncompliance    Major problem leading to declining health and recurrent hospitalization  . Nonischemic cardiomyopathy (HCC)    LVEF 20-25%  . OSA (obstructive sleep apnea)   . Osteoarthritis   . Peptic ulcer disease     Patient Active Problem List   Diagnosis Date Noted  . Acute on chronic systolic CHF (congestive heart failure) (HCC) 08/19/2016  . Systolic CHF, acute on chronic (HCC) 08/19/2016  . Acute on chronic congestive heart failure (HCC) 07/19/2016  . Acute on chronic combined systolic and diastolic CHF (congestive heart failure) (HCC) 07/19/2016  . CKD (chronic kidney disease), stage II 05/17/2016  . Coronary arteries, normal Dec 2016 01/23/2016  . Chronic anticoagulation-Xarelto 01/23/2016  . NSVT (nonsustained ventricular tachycardia) (HCC) 01/23/2016  . Elevated troponin 12/21/2015  . Nonischemic cardiomyopathy (HCC)   . Morbid obesity due to excess calories (HCC)   . Noncompliance with diet and medication regimen 12/02/2015  . Acute on chronic systolic congestive heart failure (HCC)   . Acute on chronic combined systolic and diastolic heart failure (HCC) 10/08/2015    . OSA (obstructive sleep apnea) 09/20/2015  . Pulmonary hypertension 09/19/2015  . Normocytic anemia 09/19/2015  . Chronic atrial fibrillation (HCC)   . Dyspnea on exertion 05/28/2015  . Acute respiratory failure with hypoxia (HCC) 04/01/2015  . Hypokalemia 04/01/2015  . Obesity hypoventilation syndrome (HCC) 08/08/2014  . GERD (gastroesophageal reflux disease) 08/08/2014  . Atrial fibrillation (HCC) 06/28/2014  . Edema of right lower extremity 06/21/2014  . Fecal urgency 06/21/2014  . Asthma 02/11/2009  . Hyperlipidemia 07/12/2008  . OBESITY, MORBID 07/12/2008  . Anxiety state 07/12/2008  . DEPRESSION 07/12/2008  . Essential hypertension 07/12/2008  . ALLERGIC RHINITIS 07/12/2008  . Peptic ulcer 07/12/2008  . OSTEOARTHRITIS 07/12/2008    Past Surgical History:  Procedure Laterality Date  . CARDIAC CATHETERIZATION N/A 06/14/2015   Procedure: Right/Left Heart Cath and Coronary Angiography;  Surgeon: Runell Gess, MD;  Location: [facility] INVASIVE CV LAB;  Service: Cardiovascular;  Laterality: N/A;       Home Medications    Prior to Admission medications   Medication Sig Start Date End Date Taking? Authorizing Provider  atorvastatin (LIPITOR) 40 MG tablet Take 1 tablet (40 mg total) by mouth daily at 6 PM. 06/25/16  Yes Kathlen Mody, MD  beclomethasone (QVAR) 80 MCG/ACT inhaler Inhale 2 puffs into the lungs 2 (two) times daily.    Yes Historical Provider, MD  bumetanide (BUMEX) 2 MG tablet Take 1 tablet (2 mg total) by mouth 2 (two) times daily. 07/25/16  Yes Catarina Hartshorn, MD  carvedilol (COREG)  25 MG tablet Take 1 tablet (25 mg total) by mouth 2 (two) times daily with a meal. 06/25/16  Yes Kathlen Mody, MD  ciprofloxacin (CIPRO) 500 MG tablet Take 500 mg by mouth 2 (two) times daily. 09/04/16 09/14/16 Yes Historical Provider, MD  docusate sodium (COLACE) 100 MG capsule Take 1 capsule (100 mg total) by mouth 2 (two) times daily. Patient taking differently: Take 100 mg by mouth 2 (two)  times daily as needed for mild constipation.  06/25/16  Yes Kathlen Mody, MD  ferrous gluconate (FERGON) 324 MG tablet Take 1 tablet (324 mg total) by mouth 2 (two) times daily with a meal. 06/25/16  Yes Kathlen Mody, MD  lisinopril (PRINIVIL,ZESTRIL) 5 MG tablet Take 1 tablet (5 mg total) by mouth daily. 06/25/16  Yes Kathlen Mody, MD  memantine (NAMENDA) 10 MG tablet Take 10 mg by mouth 2 (two) times daily.   Yes Historical Provider, MD  pantoprazole (PROTONIX) 40 MG tablet Take 1 tablet (40 mg total) by mouth daily at 6 (six) AM. 06/26/16  Yes Kathlen Mody, MD  potassium chloride SA (K-DUR,KLOR-CON) 20 MEQ tablet Take 2 tablets (40 mEq total) by mouth daily. Patient taking differently: Take 20 mEq by mouth 2 (two) times daily.  06/25/16  Yes Kathlen Mody, MD  rivaroxaban (XARELTO) 20 MG TABS tablet Take 1 tablet (20 mg total) by mouth daily with breakfast. Patient taking differently: Take 20 mg by mouth daily with supper.  06/26/16  Yes Kathlen Mody, MD  tamsulosin (FLOMAX) 0.4 MG CAPS capsule Take 1 capsule (0.4 mg total) by mouth daily. 06/25/16  Yes Kathlen Mody, MD  torsemide (DEMADEX) 20 MG tablet Take 20 mg by mouth 2 (two) times daily.   Yes Historical Provider, MD  spironolactone (ALDACTONE) 50 MG tablet Take 1 tablet (50 mg total) by mouth daily. Patient not taking: Reported on 09/10/2016 06/25/16   Kathlen Mody, MD  traMADol (ULTRAM) 50 MG tablet Take 1 tablet (50 mg total) by mouth every 6 (six) hours as needed. 09/10/16   Vanetta Mulders, MD    Family History Family History  Problem Relation Age of Onset  . Stroke Father   . Heart attack Father   . Aneurysm Mother     Cerebral aneurysm  . Hypertension Sister   . Colon cancer Neg Hx   . Inflammatory bowel disease Neg Hx   . Liver disease Neg Hx     Social History Social History  Substance Use Topics  . Smoking status: Former Smoker    Packs/day: 0.50    Years: 20.00    Types: Cigarettes    Start date: 04/26/1988    Quit date: 06/23/2007   . Smokeless tobacco: Never Used     Comment: 1 ppd former smoker  . Alcohol use No     Comment: No etoh since 2009     Allergies   Bee venom; Food; Lipitor [atorvastatin]; Aspirin; Banana; Metolazone; Oatmeal; Orange juice [orange oil]; Torsemide; Diltiazem; and Hydralazine   Review of Systems Review of Systems  Constitutional: Negative for fever.  HENT: Negative for congestion.   Respiratory: Negative for shortness of breath.   Cardiovascular: Positive for leg swelling. Negative for chest pain.  Gastrointestinal: Positive for abdominal pain. Negative for diarrhea, nausea and vomiting.  Genitourinary: Negative for dysuria.  Musculoskeletal: Negative for back pain.  Skin: Positive for rash.  Neurological: Negative for headaches.  Psychiatric/Behavioral: Negative for confusion.     Physical Exam Updated Vital Signs BP (!) 172/112  Pulse (!) 36   Temp 98.2 F (36.8 C) (Oral)   Resp (!) 22   Ht 6' (1.829 m)   Wt 136.1 kg   SpO2 98%   BMI 40.69 kg/m   Physical Exam  Constitutional: He is oriented to person, place, and time. He appears well-developed.  HENT:  Head: Normocephalic and atraumatic.  Mouth/Throat: Oropharynx is clear and moist.  Neck: Neck supple.  Cardiovascular: Normal rate and regular rhythm.   Pulmonary/Chest: Effort normal and breath sounds normal. He has no rales.  Abdominal: Soft. Bowel sounds are normal. There is tenderness.  Tenderness left lower quadrant  Genitourinary:  Genitourinary Comments: Palpable left groin bulge reducible. No erythema no scrotal swelling.  Musculoskeletal: Normal range of motion. He exhibits edema.  Chronic leg edema.  Neurological: He is alert and oriented to person, place, and time. He exhibits normal muscle tone. Coordination normal.  Nursing note and vitals reviewed.    ED Treatments / Results  Labs (all labs ordered are listed, but only abnormal results are displayed) Labs Reviewed  COMPREHENSIVE METABOLIC  PANEL - Abnormal; Notable for the following:       Result Value   Calcium 8.8 (*)    All other components within normal limits  CBC - Abnormal; Notable for the following:    WBC 3.3 (*)    Hemoglobin 10.9 (*)    HCT 34.7 (*)    MCH 25.2 (*)    RDW 17.0 (*)    All other components within normal limits  BRAIN NATRIURETIC PEPTIDE - Abnormal; Notable for the following:    B Natriuretic Peptide 767.0 (*)    All other components within normal limits  LIPASE, BLOOD  URINALYSIS, ROUTINE W REFLEX MICROSCOPIC    EKG  EKG Interpretation None       Radiology Dg Chest 2 View  Result Date: 09/10/2016 CLINICAL DATA:  Hypertension and atrial fibrillation. History of cardiomyopathy. EXAM: CHEST  2 VIEW COMPARISON:  September 05, 2016 FINDINGS: There is generalized cardiomegaly, stable. Pulmonary vascularity is normal. No edema or consolidation. No adenopathy. No bone lesions. IMPRESSION: Moderate generalized cardiomegaly, stable. No edema or consolidation. Electronically Signed   By: Bretta Bang III M.D.   On: 09/10/2016 19:42   Ct Abdomen Pelvis W Contrast  Result Date: 09/10/2016 CLINICAL DATA:  Left lower quadrant pain for 1 month EXAM: CT ABDOMEN AND PELVIS WITH CONTRAST TECHNIQUE: Multidetector CT imaging of the abdomen and pelvis was performed using the standard protocol following bolus administration of intravenous contrast. CONTRAST:  30mL ISOVUE-300 IOPAMIDOL (ISOVUE-300) INJECTION 61%, ISOVUE-300 IOPAMIDOL (ISOVUE-300) INJECTION 61% COMPARISON:  09/01/2016, 01/05/2016 FINDINGS: Lower chest: Scattered blebs at the lung base. No consolidation or effusion. Cardiomegaly. Hepatobiliary: Hepatic steatosis. Small calcified gallstones. No biliary dilatation Pancreas: Unremarkable. No pancreatic ductal dilatation or surrounding inflammatory changes. Spleen: Normal in size without focal abnormality. Adrenals/Urinary Tract: Adrenal glands within normal limits. Stable cysts in the kidneys. No  hydronephrosis. Bladder normal. Stomach/Bowel: The stomach is nonenlarged. No dilated small bowel. No colon wall thickening. Normal appendix. Vascular/Lymphatic: No aneurysm. No significantly enlarged lymph nodes Reproductive: Prostate is slightly enlarged and has mild mass effect on posterior bladder. Other: Again visualized is a left inguinal hernia containing sigmoid colon. There is no evidence for obstruction. There is a trace amount of fluid and soft tissue stranding along the lower aspect of the herniated bowel loop. Musculoskeletal: Stable Schmorl's node T11. No acute or suspicious bone lesion. IMPRESSION: 1. Left inguinal hernia containing sigmoid colon. There  is no evidence for a bowel obstruction. There is however minimal fluid and edema within the left inguinal hernia sac slightly inferior to the herniated bowel loop. The herniated bowel loop itself shows no wall thickening. 2. Hepatic steatosis.  Small gallstones. Electronically Signed   By: Jasmine Pang M.D.   On: 09/10/2016 20:00    Procedures Procedures (including critical care time)  Medications Ordered in ED Medications  ondansetron (ZOFRAN) injection 4 mg (4 mg Intravenous Given 09/10/16 1814)  HYDROmorphone (DILAUDID) injection 1 mg (1 mg Intravenous Given 09/10/16 1814)  iopamidol (ISOVUE-300) 61 % injection (30 mLs Oral Contrast Given 09/10/16 1840)  iopamidol (ISOVUE-300) 61 % injection 100 mL (100 mLs Intravenous Contrast Given 09/10/16 1934)  carvedilol (COREG) tablet 25 mg (25 mg Oral Given 09/10/16 2028)     Initial Impression / Assessment and Plan / ED Course  I have reviewed the triage vital signs and the nursing notes.  Pertinent labs & imaging results that were available during my care of the patient were reviewed by me and considered in my medical decision making (see chart for details).     Patient with a known history of severe CHF. Presents this time with left lower quadrant abdominal pain that he states is been  present for about 3 months. Just not getting better. We'll bit worse with movement of the left leg.  Workup without significant abnormalities. Patient's BNP not significantly elevated compared to baseline for him. CT of the abdomen does show left groin inguinal hernia without obstruction or entrapment.  On exam it seems reducible. This is most likely the cause of the patient's discomfort for the past 3 months.  Will refer to general surgery for an opinion but may have to many medical problems to make repair same. Patient will be treated with tramadol on the meantime.  The patient blood pressure elevated here but he did not have a second dose of Coreg. It was administered this evening. Patient will follow-up with her record Dr. for blood pressure checks and cardiology as needed.  Final Clinical Impressions(s) / ED Diagnoses   Final diagnoses:  Left lower quadrant pain  Unilateral inguinal hernia without obstruction or gangrene, recurrence not specified    New Prescriptions New Prescriptions   TRAMADOL (ULTRAM) 50 MG TABLET    Take 1 tablet (50 mg total) by mouth every 6 (six) hours as needed.     Vanetta Mulders, MD 09/10/16 2146

## 2016-09-18 ENCOUNTER — Encounter (HOSPITAL_COMMUNITY): Payer: Self-pay | Admitting: Emergency Medicine

## 2016-09-18 ENCOUNTER — Emergency Department (HOSPITAL_COMMUNITY)
Admission: EM | Admit: 2016-09-18 | Discharge: 2016-09-19 | Disposition: A | Discharge status: Home / Self Care | Payer: Medicare Other | Attending: Emergency Medicine | Admitting: Emergency Medicine

## 2016-09-18 ENCOUNTER — Emergency Department (HOSPITAL_COMMUNITY): Payer: Medicare Other

## 2016-09-18 DIAGNOSIS — R072 Precordial pain: Secondary | ICD-10-CM | POA: Diagnosis not present

## 2016-09-18 DIAGNOSIS — R0602 Shortness of breath: Secondary | ICD-10-CM | POA: Insufficient documentation

## 2016-09-18 DIAGNOSIS — N182 Chronic kidney disease, stage 2 (mild): Secondary | ICD-10-CM | POA: Insufficient documentation

## 2016-09-18 DIAGNOSIS — R600 Localized edema: Secondary | ICD-10-CM | POA: Insufficient documentation

## 2016-09-18 DIAGNOSIS — Z79899 Other long term (current) drug therapy: Secondary | ICD-10-CM | POA: Diagnosis not present

## 2016-09-18 DIAGNOSIS — J45909 Unspecified asthma, uncomplicated: Secondary | ICD-10-CM | POA: Insufficient documentation

## 2016-09-18 DIAGNOSIS — I130 Hypertensive heart and chronic kidney disease with heart failure and stage 1 through stage 4 chronic kidney disease, or unspecified chronic kidney disease: Secondary | ICD-10-CM | POA: Insufficient documentation

## 2016-09-18 DIAGNOSIS — Z87891 Personal history of nicotine dependence: Secondary | ICD-10-CM | POA: Diagnosis not present

## 2016-09-18 DIAGNOSIS — I5043 Acute on chronic combined systolic (congestive) and diastolic (congestive) heart failure: Secondary | ICD-10-CM | POA: Diagnosis not present

## 2016-09-18 LAB — TROPONIN I: Troponin I: <0.03 ng/mL (ref <0.03)

## 2016-09-18 LAB — CBC
HCT: 33.6 % — ABNORMAL LOW (ref 39.0–52.0)
Hemoglobin: 10.2 g/dL — ABNORMAL LOW (ref 13.0–17.0)
MCH: 24.5 pg — AB (ref 26.0–34.0)
MCHC: 30.4 g/dL (ref 30.0–36.0)
MCV: 80.8 fL (ref 78.0–100.0)
PLATELETS: 241 10*3/uL (ref 150–400)
RBC: 4.16 MIL/uL — ABNORMAL LOW (ref 4.22–5.81)
RDW: 17.2 % — AB (ref 11.5–15.5)
WBC: 4.6 10*3/uL (ref 4.0–10.5)

## 2016-09-18 LAB — BASIC METABOLIC PANEL
Anion gap: 5 (ref 5–15)
BUN: 16 mg/dL (ref 6–20)
CHLORIDE: 108 mmol/L (ref 101–111)
CO2: 27 mmol/L (ref 22–32)
CREATININE: 1.09 mg/dL (ref 0.61–1.24)
Calcium: 8.5 mg/dL — ABNORMAL LOW (ref 8.9–10.3)
GFR calc Af Amer: >60 mL/min (ref >60)
GFR calc non Af Amer: >60 mL/min (ref >60)
GLUCOSE: 112 mg/dL — AB (ref 65–99)
Potassium: 3.6 mmol/L (ref 3.5–5.1)
SODIUM: 140 mmol/L (ref 135–145)

## 2016-09-18 LAB — BRAIN NATRIURETIC PEPTIDE: B NATRIURETIC PEPTIDE 5: 660 pg/mL — AB (ref 0.0–100.0)

## 2016-09-18 LAB — I-STAT TROPONIN, ED: Troponin i, poc: 0 ng/mL (ref 0.00–0.08)

## 2016-09-18 MED ORDER — FENTANYL CITRATE (PF) 100 MCG/2ML IJ SOLN
100.0000 ug | Freq: Once | INTRAMUSCULAR | Status: AC
  Administered 2016-09-18: 100 ug via INTRAVENOUS
  Filled 2016-09-18: qty 2

## 2016-09-18 MED ORDER — NITROGLYCERIN 0.4 MG SL SUBL
0.4000 mg | SUBLINGUAL_TABLET | Freq: Once | SUBLINGUAL | Status: AC
Start: 2016-09-18 — End: 2016-09-18
  Administered 2016-09-18: 0.4 mg via SUBLINGUAL
  Filled 2016-09-18: qty 1

## 2016-09-18 MED ORDER — FUROSEMIDE 10 MG/ML IJ SOLN
80.0000 mg | Freq: Once | INTRAMUSCULAR | Status: AC
  Administered 2016-09-18: 80 mg via INTRAVENOUS
  Filled 2016-09-18: qty 8

## 2016-09-18 NOTE — ED Triage Notes (Signed)
Pt brought in by [facility] for chest pain x 1 day. Pt states pain started upon exertion. [facility] gave pt 324mg  ASA. Pt rates pain as 1/10.

## 2016-09-18 NOTE — ED Provider Notes (Signed)
[facility] Provider Note   CSN: [id number] Arrival date & time: 09/18/16  2012     History   Chief Complaint Chief Complaint  Patient presents with  . Chest Pain    HPI Samuel SCHMELZLE is a 51 y.o. male.   Chest Pain   This is a recurrent problem. The current episode started 1 to 2 hours ago. The problem occurs constantly. The problem has been gradually improving. The pain is associated with exertion and movement. The pain is present in the substernal region. The pain is at a severity of 1/10. The pain is mild. The quality of the pain is described as brief. The pain does not radiate. Associated symptoms include shortness of breath. Pertinent negatives include no back pain. He has tried nothing for the symptoms. The treatment provided no relief. Risk factors include lack of exercise, male gender and obesity.  His past medical history is significant for CHF.  Pertinent negatives for past medical history include no CAD, no MI and no PE.    Past Medical History:  Diagnosis Date  . Allergic rhinitis   . Asthma   . Atrial fibrillation (HCC)   . CHF (congestive heart failure) (HCC)   . Essential hypertension   . Gastroesophageal reflux disease   . Heart attack   . History of cardiac catheterization    Normal coronaries December 2016  . Noncompliance    Major problem leading to declining health and recurrent hospitalization  . Nonischemic cardiomyopathy (HCC)    LVEF 20-25%  . OSA (obstructive sleep apnea)   . Osteoarthritis   . Peptic ulcer disease     Patient Active Problem List   Diagnosis Date Noted  . Acute on chronic systolic CHF (congestive heart failure) (HCC) 08/19/2016  . Systolic CHF, acute on chronic (HCC) 08/19/2016  . Acute on chronic congestive heart failure (HCC) 07/19/2016  . Acute on chronic combined systolic and diastolic CHF (congestive heart failure) (HCC) 07/19/2016  . CKD (chronic kidney disease), stage II 05/17/2016  . Coronary arteries,  normal Dec 2016 01/23/2016  . Chronic anticoagulation-Xarelto 01/23/2016  . NSVT (nonsustained ventricular tachycardia) (HCC) 01/23/2016  . Elevated troponin 12/21/2015  . Nonischemic cardiomyopathy (HCC)   . Morbid obesity due to excess calories (HCC)   . Noncompliance with diet and medication regimen 12/02/2015  . Acute on chronic systolic congestive heart failure (HCC)   . Acute on chronic combined systolic and diastolic heart failure (HCC) 10/08/2015  . OSA (obstructive sleep apnea) 09/20/2015  . Pulmonary hypertension 09/19/2015  . Normocytic anemia 09/19/2015  . Chronic atrial fibrillation (HCC)   . Dyspnea on exertion 05/28/2015  . Acute respiratory failure with hypoxia (HCC) 04/01/2015  . Hypokalemia 04/01/2015  . Obesity hypoventilation syndrome (HCC) 08/08/2014  . GERD (gastroesophageal reflux disease) 08/08/2014  . Atrial fibrillation (HCC) 06/28/2014  . Edema of right lower extremity 06/21/2014  . Fecal urgency 06/21/2014  . Asthma 02/11/2009  . Hyperlipidemia 07/12/2008  . OBESITY, MORBID 07/12/2008  . Anxiety state 07/12/2008  . DEPRESSION 07/12/2008  . Essential hypertension 07/12/2008  . ALLERGIC RHINITIS 07/12/2008  . Peptic ulcer 07/12/2008  . OSTEOARTHRITIS 07/12/2008    Past Surgical History:  Procedure Laterality Date  . CARDIAC CATHETERIZATION N/A 06/14/2015   Procedure: Right/Left Heart Cath and Coronary Angiography;  Surgeon: Runell Gess, MD;  Location: [facility] INVASIVE CV LAB;  Service: Cardiovascular;  Laterality: N/A;       Home Medications    Prior to Admission medications   Medication  Sig Start Date End Date Taking? Authorizing Provider  atorvastatin (LIPITOR) 40 MG tablet Take 1 tablet (40 mg total) by mouth daily at 6 PM. 06/25/16   Kathlen Mody, MD  beclomethasone (QVAR) 80 MCG/ACT inhaler Inhale 2 puffs into the lungs 2 (two) times daily.     Historical Provider, MD  bumetanide (BUMEX) 2 MG tablet Take 1 tablet (2 mg total) by mouth 2  (two) times daily. 07/25/16   Catarina Hartshorn, MD  carvedilol (COREG) 25 MG tablet Take 1 tablet (25 mg total) by mouth 2 (two) times daily with a meal. 06/25/16   Kathlen Mody, MD  docusate sodium (COLACE) 100 MG capsule Take 1 capsule (100 mg total) by mouth 2 (two) times daily. Patient taking differently: Take 100 mg by mouth 2 (two) times daily as needed for mild constipation.  06/25/16   Kathlen Mody, MD  ferrous gluconate (FERGON) 324 MG tablet Take 1 tablet (324 mg total) by mouth 2 (two) times daily with a meal. 06/25/16   Kathlen Mody, MD  lisinopril (PRINIVIL,ZESTRIL) 5 MG tablet Take 1 tablet (5 mg total) by mouth daily. 06/25/16   Kathlen Mody, MD  memantine (NAMENDA) 10 MG tablet Take 10 mg by mouth 2 (two) times daily.    Historical Provider, MD  pantoprazole (PROTONIX) 40 MG tablet Take 1 tablet (40 mg total) by mouth daily at 6 (six) AM. 06/26/16   Kathlen Mody, MD  potassium chloride SA (K-DUR,KLOR-CON) 20 MEQ tablet Take 2 tablets (40 mEq total) by mouth daily. Patient taking differently: Take 20 mEq by mouth 2 (two) times daily.  06/25/16   Kathlen Mody, MD  rivaroxaban (XARELTO) 20 MG TABS tablet Take 1 tablet (20 mg total) by mouth daily with breakfast. Patient taking differently: Take 20 mg by mouth daily with supper.  06/26/16   Kathlen Mody, MD  spironolactone (ALDACTONE) 50 MG tablet Take 1 tablet (50 mg total) by mouth daily. Patient not taking: Reported on 09/10/2016 06/25/16   Kathlen Mody, MD  tamsulosin (FLOMAX) 0.4 MG CAPS capsule Take 1 capsule (0.4 mg total) by mouth daily. 06/25/16   Kathlen Mody, MD  torsemide (DEMADEX) 20 MG tablet Take 20 mg by mouth 2 (two) times daily.    Historical Provider, MD  traMADol (ULTRAM) 50 MG tablet Take 1 tablet (50 mg total) by mouth every 6 (six) hours as needed. 09/10/16   Vanetta Mulders, MD    Family History Family History  Problem Relation Age of Onset  . Stroke Father   . Heart attack Father   . Aneurysm Mother     Cerebral aneurysm  .  Hypertension Sister   . Colon cancer Neg Hx   . Inflammatory bowel disease Neg Hx   . Liver disease Neg Hx     Social History Social History  Substance Use Topics  . Smoking status: Former Smoker    Packs/day: 0.50    Years: 20.00    Types: Cigarettes    Start date: 04/26/1988    Quit date: 06/23/2007  . Smokeless tobacco: Never Used     Comment: 1 ppd former smoker  . Alcohol use No     Comment: No etoh since 2009     Allergies   Bee venom; Food; Lipitor [atorvastatin]; Aspirin; Banana; Metolazone; Oatmeal; Orange juice [orange oil]; Torsemide; Diltiazem; and Hydralazine   Review of Systems Review of Systems  Respiratory: Positive for shortness of breath.   Cardiovascular: Positive for chest pain.  Musculoskeletal: Negative for back pain.  All other systems reviewed and are negative.    Physical Exam Updated Vital Signs BP (!) 172/120 (BP Location: Left Arm)   Pulse 70   Temp 98.4 F (36.9 C) (Oral)   Resp 20   Ht 6' (1.829 m)   Wt 300 lb (136.1 kg)   SpO2 93%   BMI 40.69 kg/m   Physical Exam  Constitutional: He is oriented to person, place, and time. He appears well-developed and well-nourished.  HENT:  Head: Normocephalic and atraumatic.  Eyes: Conjunctivae and EOM are normal.  Neck: Normal range of motion.  Cardiovascular: Normal rate.   Pulmonary/Chest: Effort normal. No respiratory distress.  Abdominal: Soft. He exhibits no distension.  Musculoskeletal: Normal range of motion. He exhibits edema (significant bilateral lower extremity).  Neurological: He is alert and oriented to person, place, and time. No cranial nerve deficit.  Skin: Skin is warm and dry.  Nursing note and vitals reviewed.    ED Treatments / Results  Labs (all labs ordered are listed, but only abnormal results are displayed) Labs Reviewed  BASIC METABOLIC PANEL - Abnormal; Notable for the following:       Result Value   Glucose, Bld 112 (*)    Calcium 8.5 (*)    All other  components within normal limits  CBC - Abnormal; Notable for the following:    RBC 4.16 (*)    Hemoglobin 10.2 (*)    HCT 33.6 (*)    MCH 24.5 (*)    RDW 17.2 (*)    All other components within normal limits  BRAIN NATRIURETIC PEPTIDE - Abnormal; Notable for the following:    B Natriuretic Peptide 660.0 (*)    All other components within normal limits  I-STAT TROPOININ, ED    EKG  EKG Interpretation  Date/Time:  Friday September 18 2016 20:17:43 EDT Ventricular Rate:  113 PR Interval:    QRS Duration: 94 QT Interval:  343 QTC Calculation: 471 R Axis:   63 Text Interpretation:  Atrial fibrillation Ventricular premature complex Nonspecific T abnormalities, lateral leads No significant change since last tracing 3/`15/18 Confirmed by [facility] MD, Barbara Cower [phone]) on 09/18/2016 9:02:05 PM       Radiology No results found.  Procedures Procedures (including critical care time)  Angiocath insertion Performed by: Marily Memos  Consent: Verbal consent obtained. Risks and benefits: risks, benefits and alternatives were discussed Time out: Immediately prior to procedure a "time out" was called to verify the correct patient, procedure, equipment, support staff and site/side marked as required.  Preparation: Patient was prepped and draped in the usual sterile fashion.  Vein Location: left AC  Ultrasound Guided  Gauge: 20  Normal blood return and flush without difficulty Patient tolerance: Patient tolerated the procedure well with no immediate complications.     Medications Ordered in ED Medications  furosemide (LASIX) injection 80 mg (not administered)  nitroGLYCERIN (NITROSTAT) SL tablet 0.4 mg (0.4 mg Sublingual Given 09/18/16 2041)  fentaNYL (SUBLIMAZE) injection 100 mcg (100 mcg Intravenous Given 09/18/16 2041)     Initial Impression / Assessment and Plan / ED Course  I have reviewed the triage vital signs and the nursing notes.  Pertinent labs & imaging results that  were available during my care of the patient were reviewed by me and considered in my medical decision making (see chart for details).    Mild CHF exacerbation. Doubt ACS, but will check delta troponins. Will also give him a dose of IV Lasix to help with diuresis, however  is on torsemide and bumex at home. If second troponin ok, stable for dc home as long as he is pain free.  Care transferred pending repeat troponin.   Final Clinical Impressions(s) / ED Diagnoses   Final diagnoses:  None     Marily Memos, MD 09/19/16 [phone]

## 2016-09-19 NOTE — Discharge Instructions (Signed)
Workup for the chest pain without any significant findings. Stable for discharge home follow-up with your doctors. Continue current medications. No evidence of of significant congestive heart failure.

## 2016-10-01 ENCOUNTER — Emergency Department (HOSPITAL_COMMUNITY): Payer: Medicare Other

## 2016-10-01 ENCOUNTER — Inpatient Hospital Stay (HOSPITAL_COMMUNITY)
Admission: EM | Admit: 2016-10-01 | Discharge: 2016-10-01 | DRG: 292 | Disposition: A | Discharge status: Home / Self Care | Payer: Medicare Other | Attending: Internal Medicine | Admitting: Internal Medicine

## 2016-10-01 ENCOUNTER — Encounter (HOSPITAL_COMMUNITY): Payer: Self-pay | Admitting: Emergency Medicine

## 2016-10-01 ENCOUNTER — Inpatient Hospital Stay (HOSPITAL_COMMUNITY): Payer: Medicare Other

## 2016-10-01 ENCOUNTER — Ambulatory Visit: Payer: Self-pay | Admitting: General Surgery

## 2016-10-01 DIAGNOSIS — K219 Gastro-esophageal reflux disease without esophagitis: Secondary | ICD-10-CM | POA: Diagnosis not present

## 2016-10-01 DIAGNOSIS — I10 Essential (primary) hypertension: Secondary | ICD-10-CM | POA: Diagnosis present

## 2016-10-01 DIAGNOSIS — Z9119 Patient's noncompliance with other medical treatment and regimen: Secondary | ICD-10-CM

## 2016-10-01 DIAGNOSIS — Z6841 Body Mass Index (BMI) 40.0 and over, adult: Secondary | ICD-10-CM

## 2016-10-01 DIAGNOSIS — M1990 Unspecified osteoarthritis, unspecified site: Secondary | ICD-10-CM | POA: Diagnosis not present

## 2016-10-01 DIAGNOSIS — I252 Old myocardial infarction: Secondary | ICD-10-CM | POA: Diagnosis not present

## 2016-10-01 DIAGNOSIS — T447X6A Underdosing of beta-adrenoreceptor antagonists, initial encounter: Secondary | ICD-10-CM | POA: Diagnosis present

## 2016-10-01 DIAGNOSIS — I429 Cardiomyopathy, unspecified: Secondary | ICD-10-CM | POA: Diagnosis present

## 2016-10-01 DIAGNOSIS — Z79899 Other long term (current) drug therapy: Secondary | ICD-10-CM

## 2016-10-01 DIAGNOSIS — J45909 Unspecified asthma, uncomplicated: Secondary | ICD-10-CM | POA: Diagnosis not present

## 2016-10-01 DIAGNOSIS — Z8711 Personal history of peptic ulcer disease: Secondary | ICD-10-CM

## 2016-10-01 DIAGNOSIS — I110 Hypertensive heart disease with heart failure: Principal | ICD-10-CM | POA: Diagnosis present

## 2016-10-01 DIAGNOSIS — Z9981 Dependence on supplemental oxygen: Secondary | ICD-10-CM | POA: Diagnosis not present

## 2016-10-01 DIAGNOSIS — I272 Pulmonary hypertension, unspecified: Secondary | ICD-10-CM | POA: Diagnosis present

## 2016-10-01 DIAGNOSIS — N400 Enlarged prostate without lower urinary tract symptoms: Secondary | ICD-10-CM | POA: Diagnosis present

## 2016-10-01 DIAGNOSIS — I5021 Acute systolic (congestive) heart failure: Secondary | ICD-10-CM | POA: Diagnosis not present

## 2016-10-01 DIAGNOSIS — E785 Hyperlipidemia, unspecified: Secondary | ICD-10-CM | POA: Diagnosis not present

## 2016-10-01 DIAGNOSIS — D649 Anemia, unspecified: Secondary | ICD-10-CM | POA: Diagnosis not present

## 2016-10-01 DIAGNOSIS — Z87891 Personal history of nicotine dependence: Secondary | ICD-10-CM

## 2016-10-01 DIAGNOSIS — Z7951 Long term (current) use of inhaled steroids: Secondary | ICD-10-CM

## 2016-10-01 DIAGNOSIS — Z9111 Patient's noncompliance with dietary regimen: Secondary | ICD-10-CM

## 2016-10-01 DIAGNOSIS — I509 Heart failure, unspecified: Secondary | ICD-10-CM

## 2016-10-01 DIAGNOSIS — I5023 Acute on chronic systolic (congestive) heart failure: Secondary | ICD-10-CM

## 2016-10-01 DIAGNOSIS — I480 Paroxysmal atrial fibrillation: Secondary | ICD-10-CM | POA: Diagnosis not present

## 2016-10-01 DIAGNOSIS — G4733 Obstructive sleep apnea (adult) (pediatric): Secondary | ICD-10-CM | POA: Diagnosis not present

## 2016-10-01 DIAGNOSIS — Z7901 Long term (current) use of anticoagulants: Secondary | ICD-10-CM

## 2016-10-01 DIAGNOSIS — I4891 Unspecified atrial fibrillation: Secondary | ICD-10-CM | POA: Diagnosis present

## 2016-10-01 DIAGNOSIS — Z9114 Patient's other noncompliance with medication regimen: Secondary | ICD-10-CM

## 2016-10-01 DIAGNOSIS — Z91030 Bee allergy status: Secondary | ICD-10-CM

## 2016-10-01 DIAGNOSIS — Z886 Allergy status to analgesic agent status: Secondary | ICD-10-CM

## 2016-10-01 DIAGNOSIS — Z91018 Allergy to other foods: Secondary | ICD-10-CM

## 2016-10-01 DIAGNOSIS — I481 Persistent atrial fibrillation: Secondary | ICD-10-CM

## 2016-10-01 DIAGNOSIS — Z888 Allergy status to other drugs, medicaments and biological substances status: Secondary | ICD-10-CM

## 2016-10-01 DIAGNOSIS — R0602 Shortness of breath: Secondary | ICD-10-CM

## 2016-10-01 LAB — COMPREHENSIVE METABOLIC PANEL
ALT: 18 U/L (ref 17–63)
AST: 23 U/L (ref 15–41)
Albumin: 3.9 g/dL (ref 3.5–5.0)
Alkaline Phosphatase: 80 U/L (ref 38–126)
Anion gap: 10 (ref 5–15)
BUN: 20 mg/dL (ref 6–20)
CHLORIDE: 105 mmol/L (ref 101–111)
CO2: 25 mmol/L (ref 22–32)
CREATININE: 1.03 mg/dL (ref 0.61–1.24)
Calcium: 8.9 mg/dL (ref 8.9–10.3)
GFR calc Af Amer: >60 mL/min (ref >60)
Glucose, Bld: 86 mg/dL (ref 65–99)
Potassium: 3.5 mmol/L (ref 3.5–5.1)
Sodium: 140 mmol/L (ref 135–145)
Total Bilirubin: 2.1 mg/dL — ABNORMAL HIGH (ref 0.3–1.2)
Total Protein: 7.2 g/dL (ref 6.5–8.1)

## 2016-10-01 LAB — I-STAT TROPONIN, ED: Troponin i, poc: 0 ng/mL (ref 0.00–0.08)

## 2016-10-01 LAB — CBC
HCT: 34.6 % — ABNORMAL LOW (ref 39.0–52.0)
HEMATOCRIT: 33.7 % — AB (ref 39.0–52.0)
HEMOGLOBIN: 10.5 g/dL — AB (ref 13.0–17.0)
Hemoglobin: 10.7 g/dL — ABNORMAL LOW (ref 13.0–17.0)
MCH: 24.9 pg — ABNORMAL LOW (ref 26.0–34.0)
MCH: 24.8 pg — AB (ref 26.0–34.0)
MCHC: 31.2 g/dL (ref 30.0–36.0)
MCHC: 30.9 g/dL (ref 30.0–36.0)
MCV: 79.9 fL (ref 78.0–100.0)
MCV: 80.1 fL (ref 78.0–100.0)
Platelets: 216 10*3/uL (ref 150–400)
Platelets: 240 10*3/uL (ref 150–400)
RBC: 4.22 MIL/uL (ref 4.22–5.81)
RBC: 4.32 MIL/uL (ref 4.22–5.81)
RDW: 17.3 % — ABNORMAL HIGH (ref 11.5–15.5)
RDW: 17.3 % — AB (ref 11.5–15.5)
WBC: 3.7 10*3/uL — ABNORMAL LOW (ref 4.0–10.5)
WBC: 3.7 10*3/uL — AB (ref 4.0–10.5)

## 2016-10-01 LAB — RAPID URINE DRUG SCREEN, HOSP PERFORMED
AMPHETAMINES: NOT DETECTED
BENZODIAZEPINES: NOT DETECTED
Barbiturates: NOT DETECTED
Cocaine: NOT DETECTED
OPIATES: NOT DETECTED
TETRAHYDROCANNABINOL: NOT DETECTED

## 2016-10-01 LAB — HEPATIC FUNCTION PANEL
ALBUMIN: 3.9 g/dL (ref 3.5–5.0)
ALT: 19 U/L (ref 17–63)
AST: 22 U/L (ref 15–41)
Alkaline Phosphatase: 81 U/L (ref 38–126)
BILIRUBIN TOTAL: 1.9 mg/dL — AB (ref 0.3–1.2)
Bilirubin, Direct: 0.6 mg/dL — ABNORMAL HIGH (ref 0.1–0.5)
Indirect Bilirubin: 1.3 mg/dL — ABNORMAL HIGH (ref 0.3–0.9)
TOTAL PROTEIN: 7.3 g/dL (ref 6.5–8.1)

## 2016-10-01 LAB — URINALYSIS, ROUTINE W REFLEX MICROSCOPIC
BILIRUBIN URINE: NEGATIVE
Glucose, UA: NEGATIVE mg/dL
Hgb urine dipstick: NEGATIVE
Ketones, ur: NEGATIVE mg/dL
Leukocytes, UA: NEGATIVE
NITRITE: NEGATIVE
PH: 7 (ref 5.0–8.0)
Protein, ur: NEGATIVE mg/dL
Specific Gravity, Urine: 1.005 (ref 1.005–1.030)

## 2016-10-01 LAB — BASIC METABOLIC PANEL
ANION GAP: 11 (ref 5–15)
BUN: 21 mg/dL — ABNORMAL HIGH (ref 6–20)
CO2: 24 mmol/L (ref 22–32)
Calcium: 9.1 mg/dL (ref 8.9–10.3)
Chloride: 104 mmol/L (ref 101–111)
Creatinine, Ser: 1.07 mg/dL (ref 0.61–1.24)
GFR calc Af Amer: >60 mL/min (ref >60)
Glucose, Bld: 88 mg/dL (ref 65–99)
POTASSIUM: 3.7 mmol/L (ref 3.5–5.1)
SODIUM: 139 mmol/L (ref 135–145)

## 2016-10-01 LAB — BRAIN NATRIURETIC PEPTIDE: B Natriuretic Peptide: 768 pg/mL — ABNORMAL HIGH (ref 0.0–100.0)

## 2016-10-01 LAB — IRON AND TIBC
Iron: 33 ug/dL — ABNORMAL LOW (ref 45–182)
SATURATION RATIOS: 7 % — AB (ref 17.9–39.5)
TIBC: 475 ug/dL — ABNORMAL HIGH (ref 250–450)
UIBC: 442 ug/dL

## 2016-10-01 LAB — ETHANOL: Alcohol, Ethyl (B): <5 mg/dL (ref <5)

## 2016-10-01 LAB — TROPONIN I
Troponin I: 0.03 ng/mL (ref <0.03)
Troponin I: <0.03 ng/mL (ref <0.03)

## 2016-10-01 LAB — VITAMIN B12: VITAMIN B 12: 254 pg/mL (ref 180–914)

## 2016-10-01 LAB — FERRITIN: Ferritin: 21 ng/mL — ABNORMAL LOW (ref 24–336)

## 2016-10-01 LAB — TSH: TSH: 1.879 u[IU]/mL (ref 0.350–4.500)

## 2016-10-01 MED ORDER — HYDRALAZINE HCL 20 MG/ML IJ SOLN
10.0000 mg | Freq: Four times a day (QID) | INTRAMUSCULAR | Status: DC | PRN
Start: 2016-10-01 — End: 2016-10-01

## 2016-10-01 MED ORDER — DOCUSATE SODIUM 100 MG PO CAPS
100.0000 mg | ORAL_CAPSULE | Freq: Two times a day (BID) | ORAL | Status: DC
  Administered 2016-10-01: 100 mg via ORAL
  Filled 2016-10-01: qty 1

## 2016-10-01 MED ORDER — ORAL CARE MOUTH RINSE
15.0000 mL | Freq: Two times a day (BID) | OROMUCOSAL | Status: DC
  Administered 2016-10-01: 15 mL via OROMUCOSAL

## 2016-10-01 MED ORDER — ATORVASTATIN CALCIUM 40 MG PO TABS: 40.0000 mg | ORAL_TABLET | Freq: Every day | ORAL | Status: DC

## 2016-10-01 MED ORDER — PANTOPRAZOLE SODIUM 40 MG PO TBEC
40.0000 mg | DELAYED_RELEASE_TABLET | Freq: Every day | ORAL | Status: DC
  Administered 2016-10-01: 40 mg via ORAL
  Filled 2016-10-01: qty 1

## 2016-10-01 MED ORDER — SODIUM CHLORIDE 0.9% FLUSH: 3.0000 mL | INTRAVENOUS | Status: DC | PRN

## 2016-10-01 MED ORDER — SPIRONOLACTONE 25 MG PO TABS
50.0000 mg | ORAL_TABLET | Freq: Every day | ORAL | Status: DC
Start: 2016-10-01 — End: 2016-10-01
  Administered 2016-10-01: 50 mg via ORAL
  Filled 2016-10-01: qty 2

## 2016-10-01 MED ORDER — TAMSULOSIN HCL 0.4 MG PO CAPS
0.4000 mg | ORAL_CAPSULE | Freq: Every day | ORAL | Status: DC
  Administered 2016-10-01: 0.4 mg via ORAL
  Filled 2016-10-01: qty 1

## 2016-10-01 MED ORDER — SODIUM CHLORIDE 0.9 % IV SOLN
250.0000 mL | INTRAVENOUS | Status: DC | PRN
Start: 2016-10-01 — End: 2016-10-01

## 2016-10-01 MED ORDER — POTASSIUM CHLORIDE CRYS ER 20 MEQ PO TBCR
40.0000 meq | EXTENDED_RELEASE_TABLET | Freq: Every day | ORAL | Status: DC
  Administered 2016-10-01: 40 meq via ORAL
  Filled 2016-10-01: qty 2

## 2016-10-01 MED ORDER — ACETAMINOPHEN 650 MG RE SUPP: 650.0000 mg | Freq: Four times a day (QID) | RECTAL | Status: DC | PRN

## 2016-10-01 MED ORDER — FERROUS GLUCONATE 324 (38 FE) MG PO TABS
324.0000 mg | ORAL_TABLET | Freq: Two times a day (BID) | ORAL | Status: DC
  Administered 2016-10-01: 324 mg via ORAL
  Filled 2016-10-01 (×3): qty 1

## 2016-10-01 MED ORDER — RIVAROXABAN 20 MG PO TABS
20.0000 mg | ORAL_TABLET | Freq: Every day | ORAL | Status: DC
  Administered 2016-10-01: 20 mg via ORAL
  Filled 2016-10-01: qty 1

## 2016-10-01 MED ORDER — METOPROLOL TARTRATE 5 MG/5ML IV SOLN: 2.5000 mg | Freq: Once | INTRAVENOUS | Status: DC

## 2016-10-01 MED ORDER — ONDANSETRON HCL 4 MG/2ML IJ SOLN: 4.0000 mg | Freq: Four times a day (QID) | INTRAMUSCULAR | Status: DC | PRN

## 2016-10-01 MED ORDER — FUROSEMIDE 10 MG/ML IJ SOLN
80.0000 mg | Freq: Once | INTRAMUSCULAR | Status: AC
  Administered 2016-10-01: 80 mg via INTRAVENOUS
  Filled 2016-10-01: qty 8

## 2016-10-01 MED ORDER — FUROSEMIDE 10 MG/ML IJ SOLN
60.0000 mg | Freq: Two times a day (BID) | INTRAMUSCULAR | Status: DC
  Administered 2016-10-01: 60 mg via INTRAVENOUS
  Filled 2016-10-01: qty 6

## 2016-10-01 MED ORDER — SPIRONOLACTONE 50 MG PO TABS: 50.0000 mg | ORAL_TABLET | Freq: Every day | ORAL | 1 refills | Status: DC

## 2016-10-01 MED ORDER — SODIUM CHLORIDE 0.9% FLUSH
3.0000 mL | Freq: Two times a day (BID) | INTRAVENOUS | Status: DC
  Administered 2016-10-01: 3 mL via INTRAVENOUS

## 2016-10-01 MED ORDER — ACETAMINOPHEN 325 MG PO TABS
650.0000 mg | ORAL_TABLET | Freq: Four times a day (QID) | ORAL | Status: DC | PRN
Start: 2016-10-01 — End: 2016-10-01

## 2016-10-01 MED ORDER — CARVEDILOL 12.5 MG PO TABS
12.5000 mg | ORAL_TABLET | Freq: Two times a day (BID) | ORAL | Status: DC
  Administered 2016-10-01: 12.5 mg via ORAL
  Filled 2016-10-01: qty 1

## 2016-10-01 MED ORDER — ALBUTEROL SULFATE (2.5 MG/3ML) 0.083% IN NEBU: 2.5000 mg | INHALATION_SOLUTION | Freq: Four times a day (QID) | RESPIRATORY_TRACT | Status: DC | PRN

## 2016-10-01 MED ORDER — LOSARTAN POTASSIUM 50 MG PO TABS
25.0000 mg | ORAL_TABLET | Freq: Every day | ORAL | Status: DC
  Administered 2016-10-01: 25 mg via ORAL
  Filled 2016-10-01: qty 1

## 2016-10-01 NOTE — Discharge Summary (Signed)
Physician Discharge Summary  [name]:[id number] DOB: 02/24/1966 DOA: 10/01/2016  PCP: Pearson Grippe, MD  Admit date: 10/01/2016 Discharge date: 10/01/2016  Admitted From: Home.  Disposition:  Home.   Recommendations for Outpatient Follow-up:  1. Follow up with PCP in 1-2 weeks 2. Will refer to CHF clinic in [place]. 3. Follow up with cardiology as scheduled.   Home Health: None.  Equipment/Devices: No new required.  Discharge Condition: No DOE.  CODE STATUS: FULL CODE.  Diet recommendation: Caqrdiac diet.  Salt restriction.   Brief/Interim Summary:  Patient was admitted for SOB/DOE by Dr Selena Batten on October 01, 2016.  As per his H and P:  " [name]  is a 51 y.o. male, w Pafib (Chads2vasc=2), CHF (EF 25%), pulmonary htn, apparently c/o increase in dyspnea over the past 2-3 days.  Pt forgot to take his fluid pills today.  Pt has been gaining about 6-7 lbs in the past 2 weeks.  + orthopena, pnd.  Pt denies cp, fever, chills, cough, n/v, diarrhea, brbpr.    In ED, CXR => CHF.  BNP 768, slightly higher than last bnp at 660.   Trop negative. Pt found to have in afib w rvr. Pt states forgot to take his carvedilol today as well.  Pt will be admitted for CHF.   HOSPITAL COURSE:   Patient was admitted and was given IV Lasix.  He has been non compliant with his diet and medications, and had been discharged from several organizations.  He was not in severe CHF, as his CXR showed no significant pulmonary edema, and his BNP was about at his baseline.  He was encourage to be compliant with his medications and doctor's visit.  I will refer him to CHF clinic in [place].  His K will be discontinued, given that he is on ACE/ARB inhibitor and on K sparing diuretics.  His home meds were otherwise continued.   Thank you and Good Day.   Discharge Diagnoses:  Principal Problem:   CHF (congestive heart failure) (HCC) Active Problems:   Hypertension   Atrial fibrillation (HCC)   Normocytic anemia    Discharge  Instructions  Discharge Instructions    AMB referral to CHF clinic    Complete by:  As directed    Diet - low sodium heart healthy    Complete by:  As directed    Discharge instructions    Complete by:  As directed    Take your medication as directed.  Avoid salt.  Follow up with your PCP and cardiologist as scheduled.   Increase activity slowly    Complete by:  As directed      Allergies as of 10/01/2016      Reactions   Bee Venom Shortness Of Breath, Swelling, Other (See Comments)   Reaction:  Facial swelling   Food Shortness Of Breath, Rash, Other (See Comments)   Pt states that he is allergic to strawberries.     Lipitor [atorvastatin] Shortness Of Breath, Other (See Comments)   Reaction:  Nose bleeds    Aspirin Other (See Comments)   Reaction:  GI upset    Banana Diarrhea   Metolazone Other (See Comments)   Pt states that he stopped taking this med due to heart attack like symptoms.    Oatmeal Nausea And Vomiting   Orange Juice [orange Oil] Nausea And Vomiting   Torsemide Swelling, Other (See Comments)   Reaction:  Swelling of feet/legs    Diltiazem Palpitations   Hydralazine  Palpitations      Medication List    STOP taking these medications   potassium chloride SA 20 MEQ tablet Commonly known as:  K-DUR,KLOR-CON     TAKE these medications   albuterol (2.5 MG/3ML) 0.083% nebulizer solution Commonly known as:  PROVENTIL Take 2.5 mg by nebulization every 6 (six) hours as needed for wheezing or shortness of breath.   atorvastatin 40 MG tablet Commonly known as:  LIPITOR Take 1 tablet (40 mg total) by mouth daily at 6 PM.   beclomethasone 80 MCG/ACT inhaler Commonly known as:  QVAR Inhale 2 puffs into the lungs 2 (two) times daily.   bumetanide 2 MG tablet Commonly known as:  BUMEX Take 1 tablet (2 mg total) by mouth 2 (two) times daily.   carvedilol 25 MG tablet Commonly known as:  COREG Take 1 tablet (25 mg total) by mouth 2 (two) times daily with a  meal.   docusate sodium 100 MG capsule Commonly known as:  COLACE Take 1 capsule (100 mg total) by mouth 2 (two) times daily. What changed:  when to take this  reasons to take this   ferrous gluconate 324 MG tablet Commonly known as:  FERGON Take 1 tablet (324 mg total) by mouth 2 (two) times daily with a meal.   lisinopril 5 MG tablet Commonly known as:  PRINIVIL,ZESTRIL Take 1 tablet (5 mg total) by mouth daily.   OXYGEN Inhale 3 L into the lungs daily.   pantoprazole 40 MG tablet Commonly known as:  PROTONIX Take 1 tablet (40 mg total) by mouth daily at 6 (six) AM. What changed:  when to take this   rivaroxaban 20 MG Tabs tablet Commonly known as:  XARELTO Take 1 tablet (20 mg total) by mouth daily with breakfast. What changed:  when to take this   spironolactone 50 MG tablet Commonly known as:  ALDACTONE Take 1 tablet (50 mg total) by mouth daily. Start taking on:  10/02/2016   tamsulosin 0.4 MG Caps capsule Commonly known as:  FLOMAX Take 1 capsule (0.4 mg total) by mouth daily.   torsemide 20 MG tablet Commonly known as:  DEMADEX Take 20 mg by mouth daily as needed (fluid).       Allergies  Allergen Reactions  . Bee Venom Shortness Of Breath, Swelling and Other (See Comments)    Reaction:  Facial swelling  . Food Shortness Of Breath, Rash and Other (See Comments)    Pt states that he is allergic to strawberries.    . Lipitor [Atorvastatin] Shortness Of Breath and Other (See Comments)    Reaction:  Nose bleeds   . Aspirin Other (See Comments)    Reaction:  GI upset   . Banana Diarrhea  . Metolazone Other (See Comments)    Pt states that he stopped taking this med due to heart attack like symptoms.   . Oatmeal Nausea And Vomiting  . Orange Juice [Orange Oil] Nausea And Vomiting  . Torsemide Swelling and Other (See Comments)    Reaction:  Swelling of feet/legs   . Diltiazem Palpitations  . Hydralazine Palpitations    Consultations:  NONE     Procedures/Studies: Dg Chest 2 View  Result Date: 09/18/2016 CLINICAL DATA:  Acute onset of generalized chest pain and shortness of breath. Productive cough. Initial encounter. EXAM: CHEST  2 VIEW COMPARISON:  Chest radiograph performed 09/10/2016 FINDINGS: The lungs are well-aerated. Vascular congestion is noted. Increased interstitial markings raise concern for pulmonary edema. There is no  evidence of pleural effusion or pneumothorax. The heart is mildly enlarged. No acute osseous abnormalities are seen. IMPRESSION: Vascular congestion and mild cardiomegaly. Increased interstitial markings raise concern for pulmonary edema. Electronically Signed   By: Roanna Raider M.D.   On: 09/18/2016 21:35   Dg Chest 2 View  Result Date: 09/10/2016 CLINICAL DATA:  Hypertension and atrial fibrillation. History of cardiomyopathy. EXAM: CHEST  2 VIEW COMPARISON:  September 05, 2016 FINDINGS: There is generalized cardiomegaly, stable. Pulmonary vascularity is normal. No edema or consolidation. No adenopathy. No bone lesions. IMPRESSION: Moderate generalized cardiomegaly, stable. No edema or consolidation. Electronically Signed   By: Bretta Bang III M.D.   On: 09/10/2016 19:42   Dg Chest 2 View  Result Date: 09/03/2016 CLINICAL DATA:  Peripheral edema, shortness of breath for the past 3-4 days. History of cardiomyopathy, asthma, obesity, medication noncompliance, former smoker. EXAM: CHEST  2 VIEW COMPARISON:  Portable chest x-ray of September 01, 2016 FINDINGS: The lungs are reasonably well inflated. The interstitial markings are increased. The pulmonary vascularity is engorged. The cardiac silhouette is enlarged. The trachea is midline. The bony thorax exhibits no acute abnormality. IMPRESSION: CHF with pulmonary interstitial edema. No alveolar infiltrate nor pleural effusion. Electronically Signed   By: David  [place] M.D.   On: 09/03/2016 09:53   Ct Abdomen Pelvis W Contrast  Result Date: 09/10/2016 CLINICAL  DATA:  Left lower quadrant pain for 1 month EXAM: CT ABDOMEN AND PELVIS WITH CONTRAST TECHNIQUE: Multidetector CT imaging of the abdomen and pelvis was performed using the standard protocol following bolus administration of intravenous contrast. CONTRAST:  30mL ISOVUE-300 IOPAMIDOL (ISOVUE-300) INJECTION 61%, ISOVUE-300 IOPAMIDOL (ISOVUE-300) INJECTION 61% COMPARISON:  09/01/2016, 01/05/2016 FINDINGS: Lower chest: Scattered blebs at the lung base. No consolidation or effusion. Cardiomegaly. Hepatobiliary: Hepatic steatosis. Small calcified gallstones. No biliary dilatation Pancreas: Unremarkable. No pancreatic ductal dilatation or surrounding inflammatory changes. Spleen: Normal in size without focal abnormality. Adrenals/Urinary Tract: Adrenal glands within normal limits. Stable cysts in the kidneys. No hydronephrosis. Bladder normal. Stomach/Bowel: The stomach is nonenlarged. No dilated small bowel. No colon wall thickening. Normal appendix. Vascular/Lymphatic: No aneurysm. No significantly enlarged lymph nodes Reproductive: Prostate is slightly enlarged and has mild mass effect on posterior bladder. Other: Again visualized is a left inguinal hernia containing sigmoid colon. There is no evidence for obstruction. There is a trace amount of fluid and soft tissue stranding along the lower aspect of the herniated bowel loop. Musculoskeletal: Stable Schmorl's node T11. No acute or suspicious bone lesion. IMPRESSION: 1. Left inguinal hernia containing sigmoid colon. There is no evidence for a bowel obstruction. There is however minimal fluid and edema within the left inguinal hernia sac slightly inferior to the herniated bowel loop. The herniated bowel loop itself shows no wall thickening. 2. Hepatic steatosis.  Small gallstones. Electronically Signed   By: Jasmine Pang M.D.   On: 09/10/2016 20:00   Dg Chest Port 1 View  Result Date: 10/01/2016 CLINICAL DATA:  Dyspnea, history of asthma EXAM: PORTABLE CHEST 1  VIEW COMPARISON:  09/19/2016 FINDINGS: Stable cardiomegaly. No aortic aneurysm. Diffuse airspace opacities consistent with pulmonary edema. No significant effusion. No pneumothorax identified. No acute nor suspicious osseous abnormality. IMPRESSION: Stable cardiomegaly with CHF. Findings are similar to that of prior exam. Electronically Signed   By: Tollie Eth M.D.   On: 10/01/2016 01:26     Subjective: No SOB.  No chest pain.   Discharge Exam: Vitals:   10/01/16 0230 10/01/16 0300  BP: Marland Kitchen)  191/106 (!) 165/81  Pulse: (!) 57 (!) 110  Resp: 18 (!) 27  Temp:     Vitals:   10/01/16 0200 10/01/16 0230 10/01/16 0300 10/01/16 0500  BP: (!) 176/119 (!) 191/106 (!) 165/81   Pulse: (!) 52 (!) 57 (!) 110   Resp: 17 18 (!) 27   Temp:      TempSrc:      SpO2: 100% 100% 100%   Weight:    (!) 140.9 kg (310 lb 11.2 oz)  Height:    6' (1.829 m)    General: Pt is alert, awake, not in acute distress Cardiovascular: RRR, S1/S2 +, no rubs, no gallops Respiratory: CTA bilaterally, no wheezing, no rhonchi Abdominal: Soft, NT, ND, bowel sounds + Extremities: no edema, no cyanosis    The results of significant diagnostics from this hospitalization (including imaging, microbiology, ancillary and laboratory) are listed below for reference.     Basic Metabolic Panel:  Recent Labs Lab 10/01/16 0115 10/01/16 0457  NA 139 140  K 3.7 3.5  CL 104 105  CO2 24 25  GLUCOSE 88 86  BUN 21* 20  CREATININE 1.07 1.03  CALCIUM 9.1 8.9   Liver Function Tests:  Recent Labs Lab 10/01/16 0115 10/01/16 0457  AST 22 23  ALT 19 18  ALKPHOS 81 80  BILITOT 1.9* 2.1*  PROT 7.3 7.2  ALBUMIN 3.9 3.9   CBC:  Recent Labs Lab 10/01/16 0115 10/01/16 0457  WBC 3.7* 3.7*  HGB 10.5* 10.7*  HCT 33.7* 34.6*  MCV 79.9 80.1  PLT 216 240   Cardiac Enzymes:  Recent Labs Lab 10/01/16 0457 10/01/16 1013  TROPONINI 0.03* <0.03   Thyroid function studies  Recent Labs  10/01/16 0115  TSH 1.879    Urinalysis    Component Value Date/Time   COLORURINE STRAW (A) 10/01/2016 0140   APPEARANCEUR CLEAR 10/01/2016 0140   LABSPEC 1.005 10/01/2016 0140   PHURINE 7.0 10/01/2016 0140   GLUCOSEU NEGATIVE 10/01/2016 0140   GLUCOSEU NEG mg/dL 18/84/1660 6301   HGBUR NEGATIVE 10/01/2016 0140   BILIRUBINUR NEGATIVE 10/01/2016 0140   KETONESUR NEGATIVE 10/01/2016 0140   PROTEINUR NEGATIVE 10/01/2016 0140   UROBILINOGEN 0.2 08/08/2014 1445   NITRITE NEGATIVE 10/01/2016 0140   LEUKOCYTESUR NEGATIVE 10/01/2016 0140    Time coordinating discharge: Over 30 minutes SIGNED:  Houston Siren, MD FACP [facility] 10/01/2016, 12:12 PM   If 7PM-7AM, please contact night-coverage [email or web address] Password [email or web address]

## 2016-10-01 NOTE — H&P (Signed)
TR[facility] H&P   Patient Demographics:    [name], is a 51 y.o. male  MRN: [id number]   DOB - [birth year]  Admit Date - 10/01/2016  Outpatient Primary MD for the patient is Pearson Grippe, MD  Referring MD/NP/PA:  Verdie Mosher  Outpatient Specialists:   [facility]   Patient coming from: home  Chief Complaint  Patient presents with  . Shortness of Breath      HPI:    [name]  is a 51 y.o. male, w Pafib (Chads2vasc=2), CHF (EF 25%), pulmonary htn, apparently c/o increase in dyspnea over the past 2-3 days.  Pt forgot to take his fluid pills today.  Pt has been gaining about 6-7 lbs in the past 2 weeks.  + orthopena, pnd.  Pt denies cp, fever, chills, cough, n/v, diarrhea, brbpr.    In ED, CXR => CHF.  BNP 768, slightly higher than last bnp at 660.   Trop negative. Pt found to have in afib w rvr. Pt states forgot to take his carvedilol today as well.  Pt will be admitted for CHF.       Review of systems:    In addition to the HPI above,  No Fever-chills, No Headache, No changes with Vision or hearing, No problems swallowing food or Liquids, No Chest pain, Cough No Abdominal pain, No Nausea or Vommitting, Bowel movements are regular, No Blood in stool or Urine, No dysuria, No new skin rashes or bruises, No new joints pains-aches,  No new weakness, tingling, numbness in any extremity, No recent weight gain or loss, No polyuria, polydypsia or polyphagia, No significant Mental Stressors.  A full 10 point Review of Systems was done, except as stated above, all other Review of Systems were negative.   With Past History of the following :    Past Medical History:  Diagnosis Date  . Allergic rhinitis   . Asthma   . Atrial fibrillation (HCC)   . CHF (congestive heart failure) (HCC)   . Essential hypertension   . Gastroesophageal reflux disease   . Heart attack   .  History of cardiac catheterization    Normal coronaries December 2016  . Noncompliance    Major problem leading to declining health and recurrent hospitalization  . Nonischemic cardiomyopathy (HCC)    LVEF 20-25%  . OSA (obstructive sleep apnea)   . Osteoarthritis   . Peptic ulcer disease       Past Surgical History:  Procedure Laterality Date  . CARDIAC CATHETERIZATION N/A 06/14/2015   Procedure: Right/Left Heart Cath and Coronary Angiography;  Surgeon: Runell Gess, MD;  Location: [facility] INVASIVE CV LAB;  Service: Cardiovascular;  Laterality: N/A;      Social History:     Social History  Substance Use Topics  . Smoking status: Former Smoker    Packs/day: 0.50    Years: 20.00  Types: Cigarettes    Start date: 04/26/1988    Quit date: 06/23/2007  . Smokeless tobacco: Never Used     Comment: 1 ppd former smoker  . Alcohol use No     Comment: No etoh since 2009     Lives - at home  Mobility - walks by self   Family History :     Family History  Problem Relation Age of Onset  . Stroke Father   . Heart attack Father   . Aneurysm Mother     Cerebral aneurysm  . Hypertension Sister   . Colon cancer Neg Hx   . Inflammatory bowel disease Neg Hx   . Liver disease Neg Hx       Home Medications:   Prior to Admission medications   Medication Sig Start Date End Date Taking? Authorizing Provider  albuterol (PROVENTIL) (2.5 MG/3ML) 0.083% nebulizer solution Take 2.5 mg by nebulization every 6 (six) hours as needed for wheezing or shortness of breath.    Historical Provider, MD  atorvastatin (LIPITOR) 40 MG tablet Take 1 tablet (40 mg total) by mouth daily at 6 PM. 06/25/16   Kathlen Mody, MD  beclomethasone (QVAR) 80 MCG/ACT inhaler Inhale 2 puffs into the lungs 2 (two) times daily.     Historical Provider, MD  bumetanide (BUMEX) 2 MG tablet Take 1 tablet (2 mg total) by mouth 2 (two) times daily. 07/25/16   Catarina Hartshorn, MD  carvedilol (COREG) 25 MG tablet Take 1 tablet (25  mg total) by mouth 2 (two) times daily with a meal. 06/25/16   Kathlen Mody, MD  ciprofloxacin (CIPRO) 500 MG tablet Take 500 mg by mouth 2 (two) times daily.    Historical Provider, MD  docusate sodium (COLACE) 100 MG capsule Take 1 capsule (100 mg total) by mouth 2 (two) times daily. Patient taking differently: Take 100 mg by mouth 2 (two) times daily as needed for mild constipation.  06/25/16   Kathlen Mody, MD  ferrous gluconate (FERGON) 324 MG tablet Take 1 tablet (324 mg total) by mouth 2 (two) times daily with a meal. 06/25/16   Kathlen Mody, MD  lisinopril (PRINIVIL,ZESTRIL) 5 MG tablet Take 1 tablet (5 mg total) by mouth daily. 06/25/16   Kathlen Mody, MD  memantine (NAMENDA) 10 MG tablet Take 10 mg by mouth daily.     Historical Provider, MD  OXYGEN Inhale 3 L into the lungs daily.    Historical Provider, MD  pantoprazole (PROTONIX) 40 MG tablet Take 1 tablet (40 mg total) by mouth daily at 6 (six) AM. Patient taking differently: Take 40 mg by mouth every evening.  06/26/16   Kathlen Mody, MD  potassium chloride SA (K-DUR,KLOR-CON) 20 MEQ tablet Take 2 tablets (40 mEq total) by mouth daily. Patient taking differently: Take 20 mEq by mouth every evening.  06/25/16   Kathlen Mody, MD  rivaroxaban (XARELTO) 20 MG TABS tablet Take 1 tablet (20 mg total) by mouth daily with breakfast. Patient taking differently: Take 20 mg by mouth daily with supper.  06/26/16   Kathlen Mody, MD  spironolactone (ALDACTONE) 50 MG tablet Take 1 tablet (50 mg total) by mouth daily. Patient not taking: Reported on 09/10/2016 06/25/16   Kathlen Mody, MD  tamsulosin (FLOMAX) 0.4 MG CAPS capsule Take 1 capsule (0.4 mg total) by mouth daily. 06/25/16   Kathlen Mody, MD  torsemide (DEMADEX) 20 MG tablet Take 20 mg by mouth 2 (two) times daily.    Historical Provider, MD  traMADol (ULTRAM) 50 MG tablet Take 1 tablet (50 mg total) by mouth every 6 (six) hours as needed. 09/10/16   Vanetta Mulders, MD     Allergies:     Allergies    Allergen Reactions  . Bee Venom Shortness Of Breath, Swelling and Other (See Comments)    Reaction:  Facial swelling  . Food Shortness Of Breath, Rash and Other (See Comments)    Pt states that he is allergic to strawberries.    . Lipitor [Atorvastatin] Shortness Of Breath and Other (See Comments)    Reaction:  Nose bleeds   . Aspirin Other (See Comments)    Reaction:  GI upset   . Banana Diarrhea  . Metolazone Other (See Comments)    Pt states that he stopped taking this med due to heart attack like symptoms.   . Oatmeal Nausea And Vomiting  . Orange Juice [Orange Oil] Nausea And Vomiting  . Torsemide Swelling and Other (See Comments)    Reaction:  Swelling of feet/legs   . Diltiazem Palpitations  . Hydralazine Palpitations     Physical Exam:   Vitals  Blood pressure (!) 191/106, pulse (!) 57, temperature 99.3 F (37.4 C), temperature source Oral, resp. rate 18, height 6' (1.829 m), weight 136.1 kg (300 lb), SpO2 100 %.   1. General  lying in bed in NAD,    2. Normal affect and insight, Not Suicidal or Homicidal, Awake Alert, Oriented X 3.  3. No F.N deficits, ALL C.Nerves Intact, Strength 5/5 all 4 extremities, Sensation intact all 4 extremities, Plantars down going.  4. Ears and Eyes appear Normal, Conjunctivae clear, PERRLA. Moist Oral Mucosa.  5. Supple Neck, +JVD, No cervical lymphadenopathy appriciated, No Carotid Bruits.  6. Symmetrical Chest wall movement, Good air movement bilaterally,  Slight bibasilar crackles, no wheezing.   7. Irr, Irr s1, s2 , 1/6 sem apex  8. Positive Bowel Sounds, Abdomen Soft, No tenderness, No organomegaly appriciated,No rebound -guarding or rigidity.  9.  No Cyanosis, Normal Skin Turgor, No Skin Rash or Bruise.  10. Good muscle tone,  joints appear normal , no effusions, Normal ROM.  11. No Palpable Lymph Nodes in Neck or Axillae     Data Review:    CBC  Recent Labs Lab 10/01/16 0115  WBC 3.7*  HGB 10.5*  HCT 33.7*   PLT 216  MCV 79.9  MCH 24.9*  MCHC 31.2  RDW 17.3*   ------------------------------------------------------------------------------------------------------------------  Chemistries   Recent Labs Lab 10/01/16 0115  NA 139  K 3.7  CL 104  CO2 24  GLUCOSE 88  BUN 21*  CREATININE 1.07  CALCIUM 9.1  AST 22  ALT 19  ALKPHOS 81  BILITOT 1.9*   ------------------------------------------------------------------------------------------------------------------ estimated creatinine clearance is 120.6 mL/min (by C-G formula based on SCr of 1.07 mg/dL). ------------------------------------------------------------------------------------------------------------------ No results for input(s): TSH, T4TOTAL, T3FREE, THYROIDAB in the last 72 hours.  Invalid input(s): FREET3  Coagulation profile No results for input(s): INR, PROTIME in the last 168 hours. ------------------------------------------------------------------------------------------------------------------- No results for input(s): DDIMER in the last 72 hours. -------------------------------------------------------------------------------------------------------------------  Cardiac Enzymes No results for input(s): CKMB, TROPONINI, MYOGLOBIN in the last 168 hours.  Invalid input(s): CK ------------------------------------------------------------------------------------------------------------------    Component Value Date/Time   BNP 768.0 (H) 10/01/2016 0115     ---------------------------------------------------------------------------------------------------------------  Urinalysis    Component Value Date/Time   COLORURINE STRAW (A) 10/01/2016 0140   APPEARANCEUR CLEAR 10/01/2016 0140   LABSPEC 1.005 10/01/2016 0140   PHURINE 7.0 10/01/2016 0140  GLUCOSEU NEGATIVE 10/01/2016 0140   GLUCOSEU NEG mg/dL 16/03/9603 5409   HGBUR NEGATIVE 10/01/2016 0140   BILIRUBINUR NEGATIVE 10/01/2016 0140   KETONESUR NEGATIVE  10/01/2016 0140   PROTEINUR NEGATIVE 10/01/2016 0140   UROBILINOGEN 0.2 08/08/2014 1445   NITRITE NEGATIVE 10/01/2016 0140   LEUKOCYTESUR NEGATIVE 10/01/2016 0140    ----------------------------------------------------------------------------------------------------------------   Imaging Results:    Dg Chest Port 1 View  Result Date: 10/01/2016 CLINICAL DATA:  Dyspnea, history of asthma EXAM: PORTABLE CHEST 1 VIEW COMPARISON:  09/19/2016 FINDINGS: Stable cardiomegaly. No aortic aneurysm. Diffuse airspace opacities consistent with pulmonary edema. No significant effusion. No pneumothorax identified. No acute nor suspicious osseous abnormality. IMPRESSION: Stable cardiomegaly with CHF. Findings are similar to that of prior exam. Electronically Signed   By: Tollie Eth M.D.   On: 10/01/2016 01:26      Assessment & Plan:    Principal Problem:   CHF (congestive heart failure) (HCC) Active Problems:   Hypertension   Atrial fibrillation (HCC)   Normocytic anemia    1.  CHF (EF 30-35% 06/22/2016) Tele Trop I q6h x3,  Check cardiac echo Lasix 60mg  iv bid Cont spironolactone 50mg  po qday Strict I and o , check daily weight  2. Afib with rvr Restart carvedilol at 12.5mg  po bid Continue xarelto  3. Anemia Repeat cbc in am,  Check ferritin, iron, tibc, folate, b12  4. Hypertension uncontrolled Hydralazine 10mg  iv q6h prn sbp >160  5. Hyperlipidemia Continue lipitor Check lipid in am  6. Bph Continue flomax  DVT Prophylaxis Xarelto, SCDs   AM Labs Ordered, also please review Full Orders  Family Communication: Admission, patients condition and plan of care including tests being ordered have been discussed with the patient  who indicate understanding and agree with the plan and Code Status.  Code Status FULL CODE  Likely DC to  home  Condition GUARDED    Consults called:   Admission status: inpatient  Time spent in minutes : 45 minutes   Pearson Grippe M.D on  10/01/2016 at 3:04 AM  Between 7am to 7pm - Pager - [phone] After 7pm go to [email or web address] - password [email or web address]  [facility] - Office  [phone]

## 2016-10-01 NOTE — Progress Notes (Signed)
Pt's troponin 0.00 on admission, now elevated to 0.03, MD made aware. Will continue to monitor.

## 2016-10-01 NOTE — Care Management Note (Signed)
Case Management Note  Patient Details  Name: [name] MRN: [id number] Date of Birth: July 13, 1965  Subjective/Objective:    Pt is from home, admitted with CHF. Pt lives with mother and is ind with ADL's. Pt is non-compliant with his diet and medications.  Pt has home oxygen PTA. He has been discharged from multiple home care agencies and community resources due to chronic non-compliance.  Action/Plan: Patient plans to return home with self care at time of discharge. No CM needs anticipated.     Expected Discharge Date:    10/03/2016              Expected Discharge Plan:  Home/Self Care  In-House Referral:     Discharge planning Services  CM Consult  Post Acute Care Choice:    Choice offered to:     DME Arranged:    DME Agency:     HH Arranged:    HH Agency:     Status of Service:  In process, will continue to follow  If discussed at Long Length of Stay Meetings, dates discussed:    Additional Comments:  Karoline Fleer, [name], RN 10/01/2016, 11:36 AM

## 2016-10-01 NOTE — ED Provider Notes (Signed)
[facility]-EMER[facility]ENCY DEPT Provider Note   CSN: [id number] Arrival date & time: 10/01/16  0018     History   Chief Complaint Chief Complaint  Patient presents with  . Shortness of Breath    HPI [name] is a 51 y.o. male.  HPI Patient has history of congestive heart failure and presents with 1 day of increasing shortness of breath especially with lying flat and exertion. He's had mild nonproductive cough. No fever or chills. Also states he has had bilateral lower extremity swelling. States the only thing that helps when he reaches this condition is IV Lasix. Thinks he has gained 7-8 pounds in the past week. States he's been compliant with his past medications. Past Medical History:  Diagnosis Date  . Allergic rhinitis   . Asthma   . Atrial fibrillation (HCC)   . CHF (congestive heart failure) (HCC)   . Essential hypertension   . Gastroesophageal reflux disease   . Heart attack   . History of cardiac catheterization    Normal coronaries December 2016  . Noncompliance    Major problem leading to declining health and recurrent hospitalization  . Nonischemic cardiomyopathy (HCC)    LVEF 20-25%  . OSA (obstructive sleep apnea)   . Osteoarthritis   . Peptic ulcer disease     Patient Active Problem List   Diagnosis Date Noted  . CHF (congestive heart failure) (HCC) 10/01/2016  . Acute on chronic systolic CHF (congestive heart failure) (HCC) 08/19/2016  . Systolic CHF, acute on chronic (HCC) 08/19/2016  . Acute on chronic congestive heart failure (HCC) 07/19/2016  . Acute on chronic combined systolic and diastolic CHF (congestive heart failure) (HCC) 07/19/2016  . CKD (chronic kidney disease), stage II 05/17/2016  . Coronary arteries, normal Dec 2016 01/23/2016  . Chronic anticoagulation-Xarelto 01/23/2016  . NSVT (nonsustained ventricular tachycardia) (HCC) 01/23/2016  . Elevated troponin 12/21/2015  . Nonischemic cardiomyopathy (HCC)   . Morbid obesity due to excess  calories (HCC)   . Noncompliance with diet and medication regimen 12/02/2015  . Acute on chronic systolic congestive heart failure (HCC)   . Acute on chronic combined systolic and diastolic heart failure (HCC) 10/08/2015  . OSA (obstructive sleep apnea) 09/20/2015  . Pulmonary hypertension 09/19/2015  . Normocytic anemia 09/19/2015  . Chronic atrial fibrillation (HCC)   . Dyspnea on exertion 05/28/2015  . Acute respiratory failure with hypoxia (HCC) 04/01/2015  . Hypokalemia 04/01/2015  . Obesity hypoventilation syndrome (HCC) 08/08/2014  . GERD (gastroesophageal reflux disease) 08/08/2014  . Atrial fibrillation (HCC) 06/28/2014  . Edema of right lower extremity 06/21/2014  . Fecal urgency 06/21/2014  . Asthma 02/11/2009  . Hyperlipidemia 07/12/2008  . OBESITY, MORBID 07/12/2008  . Anxiety state 07/12/2008  . DEPRESSION 07/12/2008  . Essential hypertension 07/12/2008  . ALLERGIC RHINITIS 07/12/2008  . Peptic ulcer 07/12/2008  . OSTEOARTHRITIS 07/12/2008    Past Surgical History:  Procedure Laterality Date  . CARDIAC CATHETERIZATION N/A 06/14/2015   Procedure: Right/Left Heart Cath and Coronary Angiography;  Surgeon: Runell Gess, MD;  Location: [facility] INVASIVE CV LAB;  Service: Cardiovascular;  Laterality: N/A;       Home Medications    Prior to Admission medications   Medication Sig Start Date End Date Taking? Authorizing Provider  albuterol (PROVENTIL) (2.5 MG/3ML) 0.083% nebulizer solution Take 2.5 mg by nebulization every 6 (six) hours as needed for wheezing or shortness of breath.    Historical Provider, MD  atorvastatin (LIPITOR) 40 MG tablet Take 1 tablet (40  mg total) by mouth daily at 6 PM. 06/25/16   Kathlen Mody, MD  beclomethasone (QVAR) 80 MCG/ACT inhaler Inhale 2 puffs into the lungs 2 (two) times daily.     Historical Provider, MD  bumetanide (BUMEX) 2 MG tablet Take 1 tablet (2 mg total) by mouth 2 (two) times daily. 07/25/16   Catarina Hartshorn, MD  carvedilol (COREG)  25 MG tablet Take 1 tablet (25 mg total) by mouth 2 (two) times daily with a meal. 06/25/16   Kathlen Mody, MD  ciprofloxacin (CIPRO) 500 MG tablet Take 500 mg by mouth 2 (two) times daily.    Historical Provider, MD  docusate sodium (COLACE) 100 MG capsule Take 1 capsule (100 mg total) by mouth 2 (two) times daily. Patient taking differently: Take 100 mg by mouth 2 (two) times daily as needed for mild constipation.  06/25/16   Kathlen Mody, MD  ferrous gluconate (FERGON) 324 MG tablet Take 1 tablet (324 mg total) by mouth 2 (two) times daily with a meal. 06/25/16   Kathlen Mody, MD  lisinopril (PRINIVIL,ZESTRIL) 5 MG tablet Take 1 tablet (5 mg total) by mouth daily. 06/25/16   Kathlen Mody, MD  memantine (NAMENDA) 10 MG tablet Take 10 mg by mouth daily.     Historical Provider, MD  OXYGEN Inhale 3 L into the lungs daily.    Historical Provider, MD  pantoprazole (PROTONIX) 40 MG tablet Take 1 tablet (40 mg total) by mouth daily at 6 (six) AM. Patient taking differently: Take 40 mg by mouth every evening.  06/26/16   Kathlen Mody, MD  potassium chloride SA (K-DUR,KLOR-CON) 20 MEQ tablet Take 2 tablets (40 mEq total) by mouth daily. Patient taking differently: Take 20 mEq by mouth every evening.  06/25/16   Kathlen Mody, MD  rivaroxaban (XARELTO) 20 MG TABS tablet Take 1 tablet (20 mg total) by mouth daily with breakfast. Patient taking differently: Take 20 mg by mouth daily with supper.  06/26/16   Kathlen Mody, MD  spironolactone (ALDACTONE) 50 MG tablet Take 1 tablet (50 mg total) by mouth daily. Patient not taking: Reported on 09/10/2016 06/25/16   Kathlen Mody, MD  tamsulosin (FLOMAX) 0.4 MG CAPS capsule Take 1 capsule (0.4 mg total) by mouth daily. 06/25/16   Kathlen Mody, MD  torsemide (DEMADEX) 20 MG tablet Take 20 mg by mouth 2 (two) times daily.    Historical Provider, MD  traMADol (ULTRAM) 50 MG tablet Take 1 tablet (50 mg total) by mouth every 6 (six) hours as needed. 09/10/16   Vanetta Mulders, MD     Family History Family History  Problem Relation Age of Onset  . Stroke Father   . Heart attack Father   . Aneurysm Mother     Cerebral aneurysm  . Hypertension Sister   . Colon cancer Neg Hx   . Inflammatory bowel disease Neg Hx   . Liver disease Neg Hx     Social History Social History  Substance Use Topics  . Smoking status: Former Smoker    Packs/day: 0.50    Years: 20.00    Types: Cigarettes    Start date: 04/26/1988    Quit date: 06/23/2007  . Smokeless tobacco: Never Used     Comment: 1 ppd former smoker  . Alcohol use No     Comment: No etoh since 2009     Allergies   Bee venom; Food; Lipitor [atorvastatin]; Aspirin; Banana; Metolazone; Oatmeal; Orange juice [orange oil]; Torsemide; Diltiazem; and Hydralazine   Review  of Systems Review of Systems  Constitutional: Positive for fatigue. Negative for chills and fever.  HENT: Negative for congestion and sore throat.   Respiratory: Positive for cough and shortness of breath. Negative for chest tightness and wheezing.   Cardiovascular: Positive for palpitations and leg swelling. Negative for chest pain.  Gastrointestinal: Negative for abdominal pain, constipation, diarrhea, nausea and vomiting.  Genitourinary: Negative for dysuria and flank pain.  Musculoskeletal: Negative for back pain and neck pain.  Skin: Negative for rash and wound.  Neurological: Positive for weakness (generalized). Negative for dizziness, light-headedness, numbness and headaches.  All other systems reviewed and are negative.    Physical Exam Updated Vital Signs BP (!) 176/119   Pulse (!) 52   Temp 99.3 F (37.4 C) (Oral)   Resp 17   Ht 6' (1.829 m)   Wt 300 lb (136.1 kg)   SpO2 100%   BMI 40.69 kg/m   Physical Exam  Constitutional: He is oriented to person, place, and time. He appears well-developed and well-nourished.  Drowsy appearing  HENT:  Head: Normocephalic and atraumatic.  Mouth/Throat: Oropharynx is clear and moist.  No oropharyngeal exudate.  Eyes: EOM are normal. Pupils are equal, round, and reactive to light.  Neck: Normal range of motion. Neck supple.  Cardiovascular:  Tachycardia, irregularly irregular.  Pulmonary/Chest: Effort normal. He has rales.  Increased respiratory effort. Crackles in bilateral bases.  Abdominal: Soft. Bowel sounds are normal. There is no tenderness. There is no rebound and no guarding.  Musculoskeletal: Normal range of motion. He exhibits edema. He exhibits no tenderness.  3+ bilateral lower extremity edema. No definite asymmetry.  Neurological: He is alert and oriented to person, place, and time.  Drowsy but easily aroused. 5/5 motor strength. Sensation fully intact.  Skin: Skin is warm and dry. Capillary refill takes less than 2 seconds. No rash noted. No erythema.  Psychiatric: He has a normal mood and affect. His behavior is normal.  Nursing note and vitals reviewed.    ED Treatments / Results  Labs (all labs ordered are listed, but only abnormal results are displayed) Labs Reviewed  BASIC METABOLIC PANEL - Abnormal; Notable for the following:       Result Value   BUN 21 (*)    All other components within normal limits  CBC - Abnormal; Notable for the following:    WBC 3.7 (*)    Hemoglobin 10.5 (*)    HCT 33.7 (*)    MCH 24.9 (*)    RDW 17.3 (*)    All other components within normal limits  BRAIN NATRIURETIC PEPTIDE - Abnormal; Notable for the following:    B Natriuretic Peptide 768.0 (*)    All other components within normal limits  HEPATIC FUNCTION PANEL - Abnormal; Notable for the following:    Total Bilirubin 1.9 (*)    Bilirubin, Direct 0.6 (*)    Indirect Bilirubin 1.3 (*)    All other components within normal limits  URINALYSIS, ROUTINE W REFLEX MICROSCOPIC - Abnormal; Notable for the following:    Color, Urine STRAW (*)    All other components within normal limits  ETHANOL  RAPID URINE DRUG SCREEN, HOSP PERFORMED  I-STAT TROPOININ, ED     EKG  EKG Interpretation  Date/Time:  Thursday October 01 2016 00:21:43 EDT Ventricular Rate:  104 PR Interval:    QRS Duration: 87 QT Interval:  357 QTC Calculation: 470 R Axis:   71 Text Interpretation:  Atrial fibrillation Multiple ventricular premature complexes  Borderline repolarization abnormality Confirmed by Ranae Palms  MD, Dorothymae Maciver ([id number]) on 10/01/2016 12:31:26 AM       Radiology Dg Chest Port 1 View  Result Date: 10/01/2016 CLINICAL DATA:  Dyspnea, history of asthma EXAM: PORTABLE CHEST 1 VIEW COMPARISON:  09/19/2016 FINDINGS: Stable cardiomegaly. No aortic aneurysm. Diffuse airspace opacities consistent with pulmonary edema. No significant effusion. No pneumothorax identified. No acute nor suspicious osseous abnormality. IMPRESSION: Stable cardiomegaly with CHF. Findings are similar to that of prior exam. Electronically Signed   By: Tollie Eth M.D.   On: 10/01/2016 01:26    Procedures Procedures (including critical care time)  Medications Ordered in ED Medications  furosemide (LASIX) injection 80 mg (80 mg Intravenous Given 10/01/16 0043)     Initial Impression / Assessment and Plan / ED Course  I have reviewed the triage vital signs and the nursing notes.  Pertinent labs & imaging results that were available during my care of the patient were reviewed by me and considered in my medical decision making (see chart for details).     Given IV Lasix with diuresis of about a liter of urine. Patient was still unable to ambulate more than a few steps due to dyspnea and tachycardia. Discussed with hospitalist and will see patient in emergency department  Final Clinical Impressions(s) / ED Diagnoses   Final diagnoses:  Acute congestive heart failure, unspecified congestive heart failure type [facility])    New Prescriptions New Prescriptions   No medications on file     Loren Racer, MD 10/01/16 0246

## 2016-10-01 NOTE — Progress Notes (Signed)
Discharge instructions gone over with patient, verbalized understanding. IV removed, patient tolerated procedure well. 

## 2016-10-01 NOTE — ED Notes (Signed)
Attempted to call report but nurse unavailable

## 2016-10-01 NOTE — ED Triage Notes (Signed)
Pt c/o increased sob for half the day.

## 2016-10-01 NOTE — ED Notes (Signed)
Pt attempted to walk but only made it a couple of steps without getting weak. Pt's heart rate went from 90s to 130s. edp made aware.

## 2016-10-02 LAB — FOLATE RBC
FOLATE, HEMOLYSATE: 387 ng/mL
FOLATE, RBC: 1159 ng/mL (ref >498)
HEMATOCRIT: 33.4 % — AB (ref 37.5–51.0)

## 2016-10-02 LAB — HIV ANTIBODY (ROUTINE TESTING W REFLEX): HIV Screen 4th Generation wRfx: NONREACTIVE

## 2016-10-12 ENCOUNTER — Inpatient Hospital Stay (HOSPITAL_COMMUNITY): Payer: Medicare Other

## 2016-10-20 ENCOUNTER — Inpatient Hospital Stay (HOSPITAL_COMMUNITY): Admission: RE | Admit: 2016-10-20 | Payer: Medicare Other | Source: Ambulatory Visit

## 2016-10-24 ENCOUNTER — Encounter (HOSPITAL_COMMUNITY): Payer: Self-pay | Admitting: *Deleted

## 2016-10-24 ENCOUNTER — Emergency Department (HOSPITAL_COMMUNITY): Payer: Medicare Other

## 2016-10-24 ENCOUNTER — Inpatient Hospital Stay (HOSPITAL_COMMUNITY)
Admission: EM | Admit: 2016-10-24 | Discharge: 2016-10-27 | DRG: 291 | Disposition: A | Discharge status: Home / Self Care | Payer: Medicare Other | Attending: Family Medicine | Admitting: Family Medicine

## 2016-10-24 DIAGNOSIS — Z91018 Allergy to other foods: Secondary | ICD-10-CM

## 2016-10-24 DIAGNOSIS — G4733 Obstructive sleep apnea (adult) (pediatric): Secondary | ICD-10-CM | POA: Diagnosis present

## 2016-10-24 DIAGNOSIS — I5023 Acute on chronic systolic (congestive) heart failure: Secondary | ICD-10-CM | POA: Diagnosis not present

## 2016-10-24 DIAGNOSIS — I4891 Unspecified atrial fibrillation: Secondary | ICD-10-CM | POA: Diagnosis present

## 2016-10-24 DIAGNOSIS — Z886 Allergy status to analgesic agent status: Secondary | ICD-10-CM

## 2016-10-24 DIAGNOSIS — Z87891 Personal history of nicotine dependence: Secondary | ICD-10-CM

## 2016-10-24 DIAGNOSIS — I482 Chronic atrial fibrillation, unspecified: Secondary | ICD-10-CM | POA: Diagnosis present

## 2016-10-24 DIAGNOSIS — N182 Chronic kidney disease, stage 2 (mild): Secondary | ICD-10-CM | POA: Diagnosis present

## 2016-10-24 DIAGNOSIS — E876 Hypokalemia: Secondary | ICD-10-CM | POA: Diagnosis present

## 2016-10-24 DIAGNOSIS — IMO0001 Reserved for inherently not codable concepts without codable children: Secondary | ICD-10-CM

## 2016-10-24 DIAGNOSIS — Z8249 Family history of ischemic heart disease and other diseases of the circulatory system: Secondary | ICD-10-CM

## 2016-10-24 DIAGNOSIS — R748 Abnormal levels of other serum enzymes: Secondary | ICD-10-CM | POA: Diagnosis present

## 2016-10-24 DIAGNOSIS — I252 Old myocardial infarction: Secondary | ICD-10-CM

## 2016-10-24 DIAGNOSIS — I160 Hypertensive urgency: Secondary | ICD-10-CM | POA: Diagnosis present

## 2016-10-24 DIAGNOSIS — I429 Cardiomyopathy, unspecified: Secondary | ICD-10-CM | POA: Diagnosis present

## 2016-10-24 DIAGNOSIS — D649 Anemia, unspecified: Secondary | ICD-10-CM | POA: Diagnosis present

## 2016-10-24 DIAGNOSIS — Z79899 Other long term (current) drug therapy: Secondary | ICD-10-CM

## 2016-10-24 DIAGNOSIS — Z6841 Body Mass Index (BMI) 40.0 and over, adult: Secondary | ICD-10-CM

## 2016-10-24 DIAGNOSIS — Z7901 Long term (current) use of anticoagulants: Secondary | ICD-10-CM

## 2016-10-24 DIAGNOSIS — K219 Gastro-esophageal reflux disease without esophagitis: Secondary | ICD-10-CM | POA: Diagnosis present

## 2016-10-24 DIAGNOSIS — I130 Hypertensive heart and chronic kidney disease with heart failure and stage 1 through stage 4 chronic kidney disease, or unspecified chronic kidney disease: Secondary | ICD-10-CM | POA: Diagnosis not present

## 2016-10-24 DIAGNOSIS — Z9981 Dependence on supplemental oxygen: Secondary | ICD-10-CM

## 2016-10-24 DIAGNOSIS — K279 Peptic ulcer, site unspecified, unspecified as acute or chronic, without hemorrhage or perforation: Secondary | ICD-10-CM | POA: Diagnosis present

## 2016-10-24 DIAGNOSIS — R7989 Other specified abnormal findings of blood chemistry: Secondary | ICD-10-CM | POA: Diagnosis present

## 2016-10-24 DIAGNOSIS — I493 Ventricular premature depolarization: Secondary | ICD-10-CM | POA: Diagnosis present

## 2016-10-24 DIAGNOSIS — D72819 Decreased white blood cell count, unspecified: Secondary | ICD-10-CM | POA: Diagnosis present

## 2016-10-24 DIAGNOSIS — I10 Essential (primary) hypertension: Secondary | ICD-10-CM | POA: Diagnosis not present

## 2016-10-24 DIAGNOSIS — Z91030 Bee allergy status: Secondary | ICD-10-CM

## 2016-10-24 DIAGNOSIS — R778 Other specified abnormalities of plasma proteins: Secondary | ICD-10-CM | POA: Diagnosis present

## 2016-10-24 DIAGNOSIS — Z888 Allergy status to other drugs, medicaments and biological substances status: Secondary | ICD-10-CM

## 2016-10-24 DIAGNOSIS — R0602 Shortness of breath: Secondary | ICD-10-CM | POA: Diagnosis not present

## 2016-10-24 DIAGNOSIS — I509 Heart failure, unspecified: Secondary | ICD-10-CM

## 2016-10-24 DIAGNOSIS — Z0389 Encounter for observation for other suspected diseases and conditions ruled out: Secondary | ICD-10-CM

## 2016-10-24 DIAGNOSIS — Z9119 Patient's noncompliance with other medical treatment and regimen: Secondary | ICD-10-CM

## 2016-10-24 DIAGNOSIS — J45909 Unspecified asthma, uncomplicated: Secondary | ICD-10-CM | POA: Diagnosis present

## 2016-10-24 LAB — COMPREHENSIVE METABOLIC PANEL
ALBUMIN: 3.4 g/dL — AB (ref 3.5–5.0)
ALT: 18 U/L (ref 17–63)
AST: 24 U/L (ref 15–41)
Alkaline Phosphatase: 71 U/L (ref 38–126)
Anion gap: 8 (ref 5–15)
BILIRUBIN TOTAL: 1.7 mg/dL — AB (ref 0.3–1.2)
BUN: 20 mg/dL (ref 6–20)
CO2: 25 mmol/L (ref 22–32)
CREATININE: 1.09 mg/dL (ref 0.61–1.24)
Calcium: 8.6 mg/dL — ABNORMAL LOW (ref 8.9–10.3)
Chloride: 107 mmol/L (ref 101–111)
GFR calc Af Amer: >60 mL/min (ref >60)
GLUCOSE: 101 mg/dL — AB (ref 65–99)
Potassium: 3.1 mmol/L — ABNORMAL LOW (ref 3.5–5.1)
Sodium: 140 mmol/L (ref 135–145)
TOTAL PROTEIN: 6.6 g/dL (ref 6.5–8.1)

## 2016-10-24 LAB — URINALYSIS, ROUTINE W REFLEX MICROSCOPIC
BACTERIA UA: NONE SEEN
Bilirubin Urine: NEGATIVE
GLUCOSE, UA: NEGATIVE mg/dL
Hgb urine dipstick: NEGATIVE
KETONES UR: NEGATIVE mg/dL
Leukocytes, UA: NEGATIVE
NITRITE: NEGATIVE
PROTEIN: 100 mg/dL — AB
Specific Gravity, Urine: 1.027 (ref 1.005–1.030)
pH: 5 (ref 5.0–8.0)

## 2016-10-24 LAB — CBC WITH DIFFERENTIAL/PLATELET
BASOS ABS: 0 10*3/uL (ref 0.0–0.1)
Basophils Relative: 0 %
EOS ABS: 0.1 10*3/uL (ref 0.0–0.7)
EOS PCT: 3 %
HCT: 31.3 % — ABNORMAL LOW (ref 39.0–52.0)
Hemoglobin: 9.8 g/dL — ABNORMAL LOW (ref 13.0–17.0)
LYMPHS ABS: 0.9 10*3/uL (ref 0.7–4.0)
LYMPHS PCT: 24 %
MCH: 25 pg — AB (ref 26.0–34.0)
MCHC: 31.3 g/dL (ref 30.0–36.0)
MCV: 79.8 fL (ref 78.0–100.0)
MONO ABS: 0.3 10*3/uL (ref 0.1–1.0)
Monocytes Relative: 8 %
Neutro Abs: 2.5 10*3/uL (ref 1.7–7.7)
Neutrophils Relative %: 65 %
PLATELETS: 228 10*3/uL (ref 150–400)
RBC: 3.92 MIL/uL — AB (ref 4.22–5.81)
RDW: 17.3 % — AB (ref 11.5–15.5)
WBC: 3.9 10*3/uL — AB (ref 4.0–10.5)

## 2016-10-24 LAB — TROPONIN I
TROPONIN I: 0.03 ng/mL — AB (ref <0.03)
Troponin I: 0.03 ng/mL (ref <0.03)

## 2016-10-24 LAB — BRAIN NATRIURETIC PEPTIDE: B NATRIURETIC PEPTIDE 5: 947 pg/mL — AB (ref 0.0–100.0)

## 2016-10-24 MED ORDER — SODIUM CHLORIDE 0.9 % IV SOLN: 250.0000 mL | INTRAVENOUS | Status: DC | PRN

## 2016-10-24 MED ORDER — ACETAMINOPHEN 325 MG PO TABS: 650.0000 mg | ORAL_TABLET | ORAL | Status: DC | PRN

## 2016-10-24 MED ORDER — RIVAROXABAN 20 MG PO TABS
20.0000 mg | ORAL_TABLET | Freq: Every day | ORAL | Status: DC
  Administered 2016-10-24 – 2016-10-26 (×3): 20 mg via ORAL
  Filled 2016-10-24 (×6): qty 1

## 2016-10-24 MED ORDER — ACETAMINOPHEN 325 MG PO TABS: 650.0000 mg | ORAL_TABLET | Freq: Four times a day (QID) | ORAL | Status: DC | PRN

## 2016-10-24 MED ORDER — ONDANSETRON HCL 4 MG/2ML IJ SOLN: 4.0000 mg | Freq: Four times a day (QID) | INTRAMUSCULAR | Status: DC | PRN

## 2016-10-24 MED ORDER — FERROUS GLUCONATE 324 (38 FE) MG PO TABS
324.0000 mg | ORAL_TABLET | Freq: Two times a day (BID) | ORAL | Status: DC
  Administered 2016-10-24 – 2016-10-27 (×5): 324 mg via ORAL
  Filled 2016-10-24 (×8): qty 1

## 2016-10-24 MED ORDER — FUROSEMIDE 10 MG/ML IJ SOLN
80.0000 mg | Freq: Once | INTRAMUSCULAR | Status: AC
  Administered 2016-10-24: 80 mg via INTRAVENOUS
  Filled 2016-10-24: qty 8

## 2016-10-24 MED ORDER — MAGNESIUM SULFATE 2 GM/50ML IV SOLN
2.0000 g | Freq: Once | INTRAVENOUS | Status: AC
  Administered 2016-10-24: 2 g via INTRAVENOUS
  Filled 2016-10-24: qty 50

## 2016-10-24 MED ORDER — NITROGLYCERIN 2 % TD OINT
0.5000 [in_us] | TOPICAL_OINTMENT | Freq: Four times a day (QID) | TRANSDERMAL | Status: AC
  Administered 2016-10-24 – 2016-10-25 (×2): 0.5 [in_us] via TOPICAL
  Filled 2016-10-24 (×2): qty 1

## 2016-10-24 MED ORDER — POTASSIUM CHLORIDE CRYS ER 20 MEQ PO TBCR
40.0000 meq | EXTENDED_RELEASE_TABLET | Freq: Once | ORAL | Status: AC
  Administered 2016-10-24: 40 meq via ORAL
  Filled 2016-10-24: qty 2

## 2016-10-24 MED ORDER — SODIUM CHLORIDE 0.9% FLUSH
3.0000 mL | Freq: Two times a day (BID) | INTRAVENOUS | Status: DC
  Administered 2016-10-24 – 2016-10-26 (×5): 3 mL via INTRAVENOUS

## 2016-10-24 MED ORDER — FENTANYL CITRATE (PF) 100 MCG/2ML IJ SOLN
100.0000 ug | Freq: Once | INTRAMUSCULAR | Status: AC
  Administered 2016-10-24: 100 ug via INTRAVENOUS
  Filled 2016-10-24: qty 2

## 2016-10-24 MED ORDER — SODIUM CHLORIDE 0.9% FLUSH: 3.0000 mL | INTRAVENOUS | Status: DC | PRN

## 2016-10-24 MED ORDER — HYDROCODONE-ACETAMINOPHEN 5-325 MG PO TABS: 1.0000 | ORAL_TABLET | ORAL | Status: DC | PRN

## 2016-10-24 MED ORDER — ATORVASTATIN CALCIUM 40 MG PO TABS
40.0000 mg | ORAL_TABLET | Freq: Every day | ORAL | Status: DC
Start: 2016-10-24 — End: 2016-10-27
  Administered 2016-10-24 – 2016-10-26 (×3): 40 mg via ORAL
  Filled 2016-10-24 (×5): qty 1

## 2016-10-24 MED ORDER — LABETALOL HCL 5 MG/ML IV SOLN: 5.0000 mg | INTRAVENOUS | Status: DC | PRN

## 2016-10-24 MED ORDER — DOCUSATE SODIUM 100 MG PO CAPS: 100.0000 mg | ORAL_CAPSULE | Freq: Two times a day (BID) | ORAL | Status: DC | PRN

## 2016-10-24 MED ORDER — CARVEDILOL 12.5 MG PO TABS
25.0000 mg | ORAL_TABLET | Freq: Two times a day (BID) | ORAL | Status: DC
  Administered 2016-10-24 – 2016-10-27 (×6): 25 mg via ORAL
  Filled 2016-10-24 (×6): qty 2

## 2016-10-24 MED ORDER — FUROSEMIDE 10 MG/ML IJ SOLN
60.0000 mg | Freq: Two times a day (BID) | INTRAMUSCULAR | Status: DC
  Administered 2016-10-25 – 2016-10-26 (×3): 60 mg via INTRAVENOUS
  Filled 2016-10-24 (×3): qty 6

## 2016-10-24 MED ORDER — PANTOPRAZOLE SODIUM 40 MG PO TBEC
40.0000 mg | DELAYED_RELEASE_TABLET | Freq: Every evening | ORAL | Status: DC
  Administered 2016-10-24 – 2016-10-26 (×3): 40 mg via ORAL
  Filled 2016-10-24 (×3): qty 1

## 2016-10-24 MED ORDER — ALBUTEROL SULFATE (2.5 MG/3ML) 0.083% IN NEBU: 2.5000 mg | INHALATION_SOLUTION | Freq: Four times a day (QID) | RESPIRATORY_TRACT | Status: DC | PRN

## 2016-10-24 MED ORDER — TAMSULOSIN HCL 0.4 MG PO CAPS
0.4000 mg | ORAL_CAPSULE | Freq: Every day | ORAL | Status: DC
  Administered 2016-10-25 – 2016-10-27 (×3): 0.4 mg via ORAL
  Filled 2016-10-24 (×3): qty 1

## 2016-10-24 MED ORDER — SPIRONOLACTONE 25 MG PO TABS
50.0000 mg | ORAL_TABLET | Freq: Every day | ORAL | Status: DC
  Administered 2016-10-25 – 2016-10-27 (×3): 50 mg via ORAL
  Filled 2016-10-24: qty 1
  Filled 2016-10-24: qty 2
  Filled 2016-10-24: qty 1
  Filled 2016-10-24 (×2): qty 2

## 2016-10-24 MED ORDER — LISINOPRIL 5 MG PO TABS
5.0000 mg | ORAL_TABLET | Freq: Once | ORAL | Status: DC
  Administered 2016-10-24: 5 mg via ORAL
  Filled 2016-10-24: qty 1

## 2016-10-24 MED ORDER — ALBUTEROL SULFATE (2.5 MG/3ML) 0.083% IN NEBU
5.0000 mg | INHALATION_SOLUTION | Freq: Once | RESPIRATORY_TRACT | Status: AC
  Administered 2016-10-24: 5 mg via RESPIRATORY_TRACT
  Filled 2016-10-24: qty 6

## 2016-10-24 NOTE — ED Notes (Signed)
Instructed Pt's family that pt. Would be ready for pick-up at 1615.

## 2016-10-24 NOTE — ED Notes (Signed)
Total urine output = 1700 ml.  Pt reports some relief of SOB.

## 2016-10-24 NOTE — H&P (Signed)
History and Physical    LAKENDRIC PAPALIA GGY:[id number] DOB: 03-Jul-1965 DOA: 10/24/2016  PCP: Pearson Grippe, MD   Patient coming from: Home  Chief Complaint: SOB, leg swelling and pain   HPI: [name] is a 51 y.o. male with medical history significant for hypertension, chronic atrial fibrillation on Xarelto, chronic systolic CHF, and GERD with history of peptic ulcer disease who presents to the emergency department with 2-3 days of worsening shortness of breath, and pain and swelling in the bilateral legs. Patient reports that he had been in his usual state of health until contracting influenza approximately 2 weeks ago. He reports that the upper respiratory symptoms have resolved, but he has developed progressive pain and swelling in bilateral lower extremities which has been associated with persistent shortness of breath. The patient denies any chest pain or palpitations and denies any significant cough. He describes the lower extremity swelling mass symmetric. Bilateral lower extremity pain is described as severe, crampy, constant, localized to the lower legs bilaterally, and with no appreciable alleviating or exacerbating factors. Patient did not attempt any interventions prior to coming in. He reports adherence with his medications. He had been admitted for acute CHF 3 weeks ago and had a hospital follow-up appointment with cardiology last week, but was a no-show.  ED Course: Upon arrival to the ED, patient is found to be afebrile, saturating adequately on his usual 3 L/m of supplemental oxygen, hypertensive to 160/110, and with vitals otherwise stable. EKG features atrial fibrillation with rate 120 and PVC. Chest x-ray is notable for mild to moderate CHF. Chemistry panel reveals a potassium of 3.1 and a mild stable hyperbilirubinemia with total bilirubin 1.7.  CBC is notable for stable chronic leukopenia with WBC 3900 and a chronic normocytic anemia with hemoglobin of 9.8, slightly down from priors  in the 10.5 range. Troponin is at the borderline of elevation at 0.03 and BNP is elevated to 947. Urinalysis unremarkable. Patient was treated with albuterol nebulizer, 80 mg IV Lasix, 100 g IV fentanyl, and 40 mEq oral potassium. He has begun to diurese well and reports some improvement in his breathing after the neb treatment, but continues to be dyspneic with minimal exertion. He will be observed on the telemetry unit for ongoing evaluation and management of acute on chronic systolic CHF.  Review of Systems:  All other systems reviewed and apart from HPI, are negative.  Past Medical History:  Diagnosis Date  . Allergic rhinitis   . Asthma   . Atrial fibrillation (HCC)   . CHF (congestive heart failure) (HCC)   . Essential hypertension   . Gastroesophageal reflux disease   . Heart attack (HCC)   . History of cardiac catheterization    Normal coronaries December 2016  . Noncompliance    Major problem leading to declining health and recurrent hospitalization  . Nonischemic cardiomyopathy (HCC)    LVEF 20-25%  . OSA (obstructive sleep apnea)   . Osteoarthritis   . Peptic ulcer disease     Past Surgical History:  Procedure Laterality Date  . CARDIAC CATHETERIZATION N/A 06/14/2015   Procedure: Right/Left Heart Cath and Coronary Angiography;  Surgeon: Runell Gess, MD;  Location: [facility] INVASIVE CV LAB;  Service: Cardiovascular;  Laterality: N/A;     reports that he quit smoking about 9 years ago. His smoking use included Cigarettes. He started smoking about 28 years ago. He has a 10.00 pack-year smoking history. He has never used smokeless tobacco. He reports that he does  not drink alcohol or use drugs.  Allergies  Allergen Reactions  . Bee Venom Shortness Of Breath, Swelling and Other (See Comments)    Reaction:  Facial swelling  . Food Shortness Of Breath, Rash and Other (See Comments)    Pt states that he is allergic to strawberries.    . Lipitor [Atorvastatin] Shortness Of  Breath and Other (See Comments)    Reaction:  Nose bleeds   . Aspirin Other (See Comments)    Reaction:  GI upset   . Banana Diarrhea  . Metolazone Other (See Comments)    Pt states that he stopped taking this med due to heart attack like symptoms.   . Oatmeal Nausea And Vomiting  . Orange Juice [Orange Oil] Nausea And Vomiting  . Torsemide Swelling and Other (See Comments)    Reaction:  Swelling of feet/legs   . Diltiazem Palpitations  . Hydralazine Palpitations    Family History  Problem Relation Age of Onset  . Stroke Father   . Heart attack Father   . Aneurysm Mother     Cerebral aneurysm  . Hypertension Sister   . Colon cancer Neg Hx   . Inflammatory bowel disease Neg Hx   . Liver disease Neg Hx      Prior to Admission medications   Medication Sig Start Date End Date Taking? Authorizing Provider  albuterol (PROVENTIL) (2.5 MG/3ML) 0.083% nebulizer solution Take 2.5 mg by nebulization every 6 (six) hours as needed for wheezing or shortness of breath.   Yes [provider]  atorvastatin (LIPITOR) 40 MG tablet Take 1 tablet (40 mg total) by mouth daily at 6 PM. 06/25/16  Yes Kathlen Mody, MD  beclomethasone (QVAR) 80 MCG/ACT inhaler Inhale 2 puffs into the lungs 2 (two) times daily.    Yes [provider]  bumetanide (BUMEX) 2 MG tablet Take 1 tablet (2 mg total) by mouth 2 (two) times daily. 07/25/16  Yes Tat, Onalee Hua, MD  carvedilol (COREG) 25 MG tablet Take 1 tablet (25 mg total) by mouth 2 (two) times daily with a meal. 06/25/16  Yes Kathlen Mody, MD  docusate sodium (COLACE) 100 MG capsule Take 1 capsule (100 mg total) by mouth 2 (two) times daily. Patient taking differently: Take 100 mg by mouth 2 (two) times daily as needed for mild constipation.  06/25/16  Yes Kathlen Mody, MD  ferrous gluconate (FERGON) 324 MG tablet Take 1 tablet (324 mg total) by mouth 2 (two) times daily with a meal. 06/25/16  Yes Kathlen Mody, MD  lisinopril (PRINIVIL,ZESTRIL) 5 MG  tablet Take 1 tablet (5 mg total) by mouth daily. 06/25/16  Yes Kathlen Mody, MD  OXYGEN Inhale 3 L into the lungs daily.   Yes [provider]  pantoprazole (PROTONIX) 40 MG tablet Take 1 tablet (40 mg total) by mouth daily at 6 (six) AM. Patient taking differently: Take 40 mg by mouth every evening.  06/26/16  Yes Kathlen Mody, MD  rivaroxaban (XARELTO) 20 MG TABS tablet Take 1 tablet (20 mg total) by mouth daily with breakfast. Patient taking differently: Take 20 mg by mouth daily with supper.  06/26/16  Yes Kathlen Mody, MD  spironolactone (ALDACTONE) 50 MG tablet Take 1 tablet (50 mg total) by mouth daily. 10/02/16  Yes Houston Siren, MD  tamsulosin (FLOMAX) 0.4 MG CAPS capsule Take 1 capsule (0.4 mg total) by mouth daily. 06/25/16  Yes Kathlen Mody, MD  torsemide (DEMADEX) 20 MG tablet Take 20 mg by mouth daily as needed (  fluid).    Yes [provider]    Physical Exam: Vitals:   10/24/16 1530 10/24/16 1545 10/24/16 1600 10/24/16 1630  BP: (!) 170/124  (!) 173/127 (!) 181/169  Pulse: (!) 105 88 81   Resp: 18     Temp:      TempSrc:      SpO2: 100% 98% 94%   Weight:      Height:          Constitutional: Mild tachypnea, calm, cooperative Eyes: PERTLA, lids and conjunctivae normal ENMT: Mucous membranes are moist. Posterior pharynx clear of any exudate or lesions.   Neck: normal, supple, no masses, no thyromegaly Respiratory: Increased WOB. Fine rales in bilateral bases and mid-lung zones. No pallor or cyanosis.   Cardiovascular: Rate ~100 and irregular. Bilateral leg swelling into thighs. JVP 10 mmHg. Abdomen: No distension, no tenderness, no masses palpated. Bowel sounds normal.  Musculoskeletal: no clubbing / cyanosis. No joint deformity upper and lower extremities.  Skin: no significant rashes, lesions, ulcers. Warm, dry, well-perfused. Neurologic: CN 2-12 grossly intact. Sensation intact, DTR normal. Strength 5/5 in all 4 limbs.  Psychiatric: Alert and oriented x  3. Calm and cooperative.     Labs on Admission: I have personally reviewed following labs and imaging studies  CBC:  Recent Labs Lab 10/24/16 0922  WBC 3.9*  NEUTROABS 2.5  HGB 9.8*  HCT 31.3*  MCV 79.8  PLT 228   Basic Metabolic Panel:  Recent Labs Lab 10/24/16 0922  NA 140  K 3.1*  CL 107  CO2 25  GLUCOSE 101*  BUN 20  CREATININE 1.09  CALCIUM 8.6*   GFR: Estimated Creatinine Clearance: 116.8 mL/min (by C-G formula based on SCr of 1.09 mg/dL). Liver Function Tests:  Recent Labs Lab 10/24/16 0922  AST 24  ALT 18  ALKPHOS 71  BILITOT 1.7*  PROT 6.6  ALBUMIN 3.4*   No results for input(s): LIPASE, AMYLASE in the last 168 hours. No results for input(s): AMMONIA in the last 168 hours. Coagulation Profile: No results for input(s): INR, PROTIME in the last 168 hours. Cardiac Enzymes:  Recent Labs Lab 10/24/16 0922 10/24/16 1159  TROPONINI 0.03* 0.03*   BNP (last 3 results) No results for input(s): PROBNP in the last 8760 hours. HbA1C: No results for input(s): HGBA1C in the last 72 hours. CBG: No results for input(s): GLUCAP in the last 168 hours. Lipid Profile: No results for input(s): CHOL, HDL, LDLCALC, TRIG, CHOLHDL, LDLDIRECT in the last 72 hours. Thyroid Function Tests: No results for input(s): TSH, T4TOTAL, FREET4, T3FREE, THYROIDAB in the last 72 hours. Anemia Panel: No results for input(s): VITAMINB12, FOLATE, FERRITIN, TIBC, IRON, RETICCTPCT in the last 72 hours. Urine analysis:    Component Value Date/Time   COLORURINE AMBER (A) 10/24/2016 0923   APPEARANCEUR CLEAR 10/24/2016 0923   LABSPEC 1.027 10/24/2016 0923   PHURINE 5.0 10/24/2016 0923   GLUCOSEU NEGATIVE 10/24/2016 0923   GLUCOSEU NEG mg/dL 16/03/9603 5409   HGBUR NEGATIVE 10/24/2016 0923   BILIRUBINUR NEGATIVE 10/24/2016 0923   KETONESUR NEGATIVE 10/24/2016 0923   PROTEINUR 100 (A) 10/24/2016 0923   UROBILINOGEN 0.2 08/08/2014 1445   NITRITE NEGATIVE 10/24/2016 0923    LEUKOCYTESUR NEGATIVE 10/24/2016 0923   Sepsis Labs: @LABRCNTIP (procalcitonin:4,lacticidven:4) )No results found for this or any previous visit (from the past 240 hour(s)).   Radiological Exams on Admission: Dg Chest 2 View  Result Date: 10/24/2016 CLINICAL DATA:  Dyspnea EXAM: CHEST  2 VIEW COMPARISON:  10/16/2016 chest  radiograph. FINDINGS: Stable cardiomediastinal silhouette with mild cardiomegaly. No pneumothorax. No pleural effusion. Mild to moderate pulmonary edema. IMPRESSION: Mild-to-moderate congestive heart failure. Electronically Signed   By: Delbert Phenix M.D.   On: 10/24/2016 09:29    EKG: Independently reviewed. Atrial fibrillation (rate 120), PVC  Assessment/Plan  1. Acute on chronic systolic CHF  - Pt presents with worsening peripheral edema and SOB, noted to have JVD and crackles  - TTE (07/02/16) with EF 30-35%, moderate-severe LVH, diffuse HK, severe LAE, and moderate MR - Managed at home with Bumex 2 mg BID, Aldactone, Creg, and lisinopril - He was give 80 mg IV Lasix in ED and has begun to diurese well  - Plan to monitor on telemetry, SLIV, fluid-restrict diet, follow daily wts and strict I/O's, continue diuresis with Lasix 60 mg IV q12h, continue Aldactone and Coreg, hold lisinopril initially while following daily chem panel   2. Chronic atrial fibrillation  - Pt is in atrial fibrillation on admission, rate 90-120  - CHADS-VASc at least 2 (CHF, HTN)  - Continue Xarelto and Coreg  3. Hypertension with hypertensive urgency  - BP elevated to 160/110 range on presentation  - Acute CHF likely contributing, anticipate improvement with diuresis  - Continue Coreg, hold lisinopril initially while diuresing  - Reports allergy to hydralazine, will treat with NTG for now while diuresing    4. CKD stage II  - SCr is 1.09 on admission, stable  - Hold lisinopril initially and follow qAM chem panel during IV diuresis     5. GERD - Hx of PUD managed with daily Protonix, will  continue   6. Elevated troponin - Troponin slightly elevated in ED  - No anginal complaints; normal coronaries in 2016 - Continue beta-blocker, statin, telemetry monitoring    DVT prophylaxis: Xarelto  Code Status: Full  Family Communication: Discussed with patient Disposition Plan: Observe on telemetry Consults called: None Admission status: Observation    Briscoe Deutscher, MD [facility] Pager [phone]  If 7PM-7AM, please contact night-coverage [email or web address] Password [facility]  10/24/2016, 5:33 PM

## 2016-10-24 NOTE — Discharge Instructions (Signed)
Call your doctor to arrange a follow-up appt on Monday or Tuesday.  Return here for any worsening symptoms.  Call your cardiologist Monday to reschedule your appt.

## 2016-10-24 NOTE — ED Notes (Signed)
Pt stable and ready for transport to [facility].  Report called to [place], RN.

## 2016-10-24 NOTE — ED Provider Notes (Signed)
[facility]-EMER[facility]ENCY DEPT Provider Note   CSN: [id number] Arrival date & time: 10/24/16  0827     History   Chief Complaint Chief Complaint  Patient presents with  . Shortness of Breath    HPI [name] is a 51 y.o. male.  HPI  [name] is a 51 y.o. male with PMHx of CHF, HTN and A fib who presents to the Emergency Department complaining of increasing short of breath for two days.  He was admitted and discharged from this hospital on 10/01/16 with recommendation of cards f/u and referral to CHF clinic in [place].  He was a no show to his cardiology appt.  Today, he complains of cough and increasing shortness of breath with exertion.  He states that he had " the flu" 2 weeks ago and hasn't complete;ly recovered.  He also states that he has been wearing his oxygen at home, but has not taken an albuterol treatment " in some time."  He also reports increasing swelling and pain to bilateral lower extremities.  He denies chest pain, fever, chills, and abdominal pain.  Past Medical History:  Diagnosis Date  . Allergic rhinitis   . Asthma   . Atrial fibrillation (HCC)   . CHF (congestive heart failure) (HCC)   . Essential hypertension   . Gastroesophageal reflux disease   . Heart attack (HCC)   . History of cardiac catheterization    Normal coronaries December 2016  . Noncompliance    Major problem leading to declining health and recurrent hospitalization  . Nonischemic cardiomyopathy (HCC)    LVEF 20-25%  . OSA (obstructive sleep apnea)   . Osteoarthritis   . Peptic ulcer disease     Patient Active Problem List   Diagnosis Date Noted  . CHF (congestive heart failure) (HCC) 10/01/2016  . Acute on chronic systolic CHF (congestive heart failure) (HCC) 08/19/2016  . Systolic CHF, acute on chronic (HCC) 08/19/2016  . Acute on chronic congestive heart failure (HCC) 07/19/2016  . Acute on chronic combined systolic and diastolic CHF (congestive heart failure) (HCC) 07/19/2016  .  CKD (chronic kidney disease), stage II 05/17/2016  . Coronary arteries, normal Dec 2016 01/23/2016  . Chronic anticoagulation-Xarelto 01/23/2016  . NSVT (nonsustained ventricular tachycardia) (HCC) 01/23/2016  . Elevated troponin 12/21/2015  . Nonischemic cardiomyopathy (HCC)   . Morbid obesity due to excess calories (HCC)   . Noncompliance with diet and medication regimen 12/02/2015  . Acute on chronic systolic congestive heart failure (HCC)   . Acute on chronic combined systolic and diastolic heart failure (HCC) 10/08/2015  . OSA (obstructive sleep apnea) 09/20/2015  . Pulmonary hypertension (HCC) 09/19/2015  . Normocytic anemia 09/19/2015  . Chronic atrial fibrillation (HCC)   . Dyspnea on exertion 05/28/2015  . Acute respiratory failure with hypoxia (HCC) 04/01/2015  . Hypokalemia 04/01/2015  . Obesity hypoventilation syndrome (HCC) 08/08/2014  . GERD (gastroesophageal reflux disease) 08/08/2014  . Atrial fibrillation (HCC) 06/28/2014  . Edema of right lower extremity 06/21/2014  . Fecal urgency 06/21/2014  . Asthma 02/11/2009  . Hyperlipidemia 07/12/2008  . OBESITY, MORBID 07/12/2008  . Anxiety state 07/12/2008  . DEPRESSION 07/12/2008  . Hypertension 07/12/2008  . ALLERGIC RHINITIS 07/12/2008  . Peptic ulcer 07/12/2008  . OSTEOARTHRITIS 07/12/2008    Past Surgical History:  Procedure Laterality Date  . CARDIAC CATHETERIZATION N/A 06/14/2015   Procedure: Right/Left Heart Cath and Coronary Angiography;  Surgeon: Runell Gess, MD;  Location: [facility] INVASIVE CV LAB;  Service: Cardiovascular;  Laterality: N/A;       Home Medications    Prior to Admission medications   Medication Sig Start Date End Date Taking? Authorizing Provider  albuterol (PROVENTIL) (2.5 MG/3ML) 0.083% nebulizer solution Take 2.5 mg by nebulization every 6 (six) hours as needed for wheezing or shortness of breath.    [provider]  atorvastatin (LIPITOR) 40 MG tablet Take 1 tablet (40 mg  total) by mouth daily at 6 PM. 06/25/16   Kathlen Mody, MD  beclomethasone (QVAR) 80 MCG/ACT inhaler Inhale 2 puffs into the lungs 2 (two) times daily.     [provider]  bumetanide (BUMEX) 2 MG tablet Take 1 tablet (2 mg total) by mouth 2 (two) times daily. 07/25/16   Catarina Hartshorn, MD  carvedilol (COREG) 25 MG tablet Take 1 tablet (25 mg total) by mouth 2 (two) times daily with a meal. 06/25/16   Kathlen Mody, MD  docusate sodium (COLACE) 100 MG capsule Take 1 capsule (100 mg total) by mouth 2 (two) times daily. Patient taking differently: Take 100 mg by mouth 2 (two) times daily as needed for mild constipation.  06/25/16   Kathlen Mody, MD  ferrous gluconate (FERGON) 324 MG tablet Take 1 tablet (324 mg total) by mouth 2 (two) times daily with a meal. 06/25/16   Kathlen Mody, MD  lisinopril (PRINIVIL,ZESTRIL) 5 MG tablet Take 1 tablet (5 mg total) by mouth daily. 06/25/16   Kathlen Mody, MD  OXYGEN Inhale 3 L into the lungs daily.    [provider]  pantoprazole (PROTONIX) 40 MG tablet Take 1 tablet (40 mg total) by mouth daily at 6 (six) AM. Patient taking differently: Take 40 mg by mouth every evening.  06/26/16   Kathlen Mody, MD  rivaroxaban (XARELTO) 20 MG TABS tablet Take 1 tablet (20 mg total) by mouth daily with breakfast. Patient taking differently: Take 20 mg by mouth daily with supper.  06/26/16   Kathlen Mody, MD  spironolactone (ALDACTONE) 50 MG tablet Take 1 tablet (50 mg total) by mouth daily. 10/02/16   Houston Siren, MD  tamsulosin (FLOMAX) 0.4 MG CAPS capsule Take 1 capsule (0.4 mg total) by mouth daily. 06/25/16   Kathlen Mody, MD  torsemide (DEMADEX) 20 MG tablet Take 20 mg by mouth daily as needed (fluid).     [provider]    Family History Family History  Problem Relation Age of Onset  . Stroke Father   . Heart attack Father   . Aneurysm Mother     Cerebral aneurysm  . Hypertension Sister   . Colon cancer Neg Hx   . Inflammatory bowel disease Neg Hx     . Liver disease Neg Hx     Social History Social History  Substance Use Topics  . Smoking status: Former Smoker    Packs/day: 0.50    Years: 20.00    Types: Cigarettes    Start date: 04/26/1988    Quit date: 06/23/2007  . Smokeless tobacco: Never Used     Comment: 1 ppd former smoker  . Alcohol use No     Comment: No etoh since 2009     Allergies   Bee venom; Food; Lipitor [atorvastatin]; Aspirin; Banana; Metolazone; Oatmeal; Orange juice [orange oil]; Torsemide; Diltiazem; and Hydralazine   Review of Systems Review of Systems  Constitutional: Negative for appetite change, chills and fever.  HENT: Negative for congestion, sore throat and trouble swallowing.   Respiratory: Positive for cough, chest tightness and shortness of  breath. Negative for wheezing.   Cardiovascular: Positive for leg swelling. Negative for chest pain.  Gastrointestinal: Negative for abdominal pain, nausea and vomiting.  Genitourinary: Negative for dysuria.  Musculoskeletal: Negative for arthralgias.  Skin: Negative for rash.  Neurological: Negative for dizziness, weakness and numbness.  Hematological: Negative for adenopathy.  All other systems reviewed and are negative.    Physical Exam Updated Vital Signs BP (!) 159/110 (BP Location: Left Arm)   Pulse 97   Temp 98.6 F (37 C) (Oral)   Resp 20   Ht 5\' 11"  (1.803 m)   Wt 136.1 kg   SpO2 96%   BMI 41.84 kg/m   Physical Exam  Constitutional: He is oriented to person, place, and time. He appears well-developed and well-nourished. No distress.  HENT:  Head: Normocephalic and atraumatic.  Right Ear: Tympanic membrane and ear canal normal.  Left Ear: Tympanic membrane and ear canal normal.  Mouth/Throat: Uvula is midline, oropharynx is clear and moist and mucous membranes are normal. No oropharyngeal exudate.  Eyes: EOM are normal. Pupils are equal, round, and reactive to light.  Neck: Normal range of motion, full passive range of motion  without pain and phonation normal. Neck supple. No JVD present.  Cardiovascular: Intact distal pulses.  An irregularly irregular rhythm present.  No murmur heard. Pulmonary/Chest: Effort normal. No stridor. No respiratory distress. He has no rales. He exhibits no tenderness.  Diminished lung sounds bilaterally,  No rales or wheezing.  Abdominal: Soft. He exhibits no distension. There is no tenderness. There is no guarding.  Musculoskeletal: He exhibits edema.  3+ edema bilateral LE's.  5/5 motor strength bilaterally. No erythema or excessive warmth  Lymphadenopathy:    He has no cervical adenopathy.  Neurological: He is alert and oriented to person, place, and time. He exhibits normal muscle tone. Coordination normal.  Skin: Skin is warm and dry. Capillary refill takes less than 2 seconds.  Psychiatric: He has a normal mood and affect.  Nursing note and vitals reviewed.    ED Treatments / Results  Labs (all labs ordered are listed, but only abnormal results are displayed) Labs Reviewed  COMPREHENSIVE METABOLIC PANEL - Abnormal; Notable for the following:       Result Value   Potassium 3.1 (*)    Glucose, Bld 101 (*)    Calcium 8.6 (*)    Albumin 3.4 (*)    Total Bilirubin 1.7 (*)    All other components within normal limits  CBC WITH DIFFERENTIAL/PLATELET - Abnormal; Notable for the following:    WBC 3.9 (*)    RBC 3.92 (*)    Hemoglobin 9.8 (*)    HCT 31.3 (*)    MCH 25.0 (*)    RDW 17.3 (*)    All other components within normal limits  BRAIN NATRIURETIC PEPTIDE - Abnormal; Notable for the following:    B Natriuretic Peptide 947.0 (*)    All other components within normal limits  TROPONIN I - Abnormal; Notable for the following:    Troponin I 0.03 (*)    All other components within normal limits  URINALYSIS, ROUTINE W REFLEX MICROSCOPIC - Abnormal; Notable for the following:    Color, Urine AMBER (*)    Protein, ur 100 (*)    Squamous Epithelial / LPF 0-5 (*)    All  other components within normal limits  TROPONIN I - Abnormal; Notable for the following:    Troponin I 0.03 (*)    All other components within  normal limits    EKG  EKG Interpretation  Date/Time:  Saturday Oct 24 2016 08:30:02 EDT Ventricular Rate:  120 PR Interval:    QRS Duration: 93 QT Interval:  339 QTC Calculation: 479 R Axis:   66 Text Interpretation:  Atrial fibrillation Ventricular premature complex Borderline repolarization abnormality Borderline prolonged QT interval Confirmed by Adriana Simas  MD, BRIAN ([id number]) on 10/24/2016 9:28:57 AM       Radiology Dg Chest 2 View  Result Date: 10/24/2016 CLINICAL DATA:  Dyspnea EXAM: CHEST  2 VIEW COMPARISON:  10/16/2016 chest radiograph. FINDINGS: Stable cardiomediastinal silhouette with mild cardiomegaly. No pneumothorax. No pleural effusion. Mild to moderate pulmonary edema. IMPRESSION: Mild-to-moderate congestive heart failure. Electronically Signed   By: Delbert Phenix M.D.   On: 10/24/2016 09:29    Procedures Procedures (including critical care time)  Medications Ordered in ED Medications  albuterol (PROVENTIL) (2.5 MG/3ML) 0.083% nebulizer solution 5 mg (not administered)     Initial Impression / Assessment and Plan / ED Course  I have reviewed the triage vital signs and the nursing notes.  Pertinent labs & imaging results that were available during my care of the patient were reviewed by me and considered in my medical decision making (see chart for details).     Pt non-toxic appearing.  No respiratory distress noted.  Sat 95% on RA.  CXR shows mild to moderate CHF.     1300  BNP elevated, Pt feeling better after IV Lasix.  Pt also seen by Dr. Adriana Simas and care plan discussed.  Pt ambulating in the room w/o difficulty.  No hypoxia.    1320  Total urine output is 1700 ml, dyspnea improved.  Pt remains hypertensive, has not taken his BP medications today.  Asymptomatic   Pt states that he prefers to go home at this time.   Pt  requesting pain medication for leg pain.  Will dispo with strict return precuations.    1700  Pt continues to complain of heaviness of the BLE's and feeling swollen.  I will consult hospitalist for admission.    1712  Consulted hospitalist, Dr. Antionette Char, who agrees to admit.    Final Clinical Impressions(s) / ED Diagnoses   Final diagnoses:  Acute on chronic congestive heart failure, unspecified heart failure type [facility])  Essential hypertension    New Prescriptions New Prescriptions   No medications on file     Pauline Aus, Cordelia Poche 10/24/16 1719    Donnetta Hutching, MD 10/25/16 1324

## 2016-10-24 NOTE — ED Notes (Signed)
CRITICAL VALUE ALERT  Critical value received:  Troponin = 0.03 Date of notification:  10/24/16  Time of notification:  1005  Critical value read back:Yes.    Nurse who received alert:  Antony Odea  MD notified (1st page):  Adriana Simas  Time of first page:  1006  MD notified (2nd page):  Time of second page:  Responding MD:  Adriana Simas  Time MD responded:  1006

## 2016-10-24 NOTE — ED Triage Notes (Signed)
Pt reports increasing shortness of breath over past two days, says he just had the flu 2 weeks ago. Pt uses 3.5 L O2 at home and nebulizer treatments every two days.

## 2016-10-25 DIAGNOSIS — I252 Old myocardial infarction: Secondary | ICD-10-CM | POA: Diagnosis not present

## 2016-10-25 DIAGNOSIS — I482 Chronic atrial fibrillation: Secondary | ICD-10-CM | POA: Diagnosis present

## 2016-10-25 DIAGNOSIS — Z0389 Encounter for observation for other suspected diseases and conditions ruled out: Secondary | ICD-10-CM | POA: Diagnosis not present

## 2016-10-25 DIAGNOSIS — I10 Essential (primary) hypertension: Secondary | ICD-10-CM | POA: Diagnosis not present

## 2016-10-25 DIAGNOSIS — E876 Hypokalemia: Secondary | ICD-10-CM | POA: Diagnosis present

## 2016-10-25 DIAGNOSIS — R0602 Shortness of breath: Secondary | ICD-10-CM | POA: Diagnosis present

## 2016-10-25 DIAGNOSIS — Z87891 Personal history of nicotine dependence: Secondary | ICD-10-CM | POA: Diagnosis not present

## 2016-10-25 DIAGNOSIS — R748 Abnormal levels of other serum enzymes: Secondary | ICD-10-CM | POA: Diagnosis present

## 2016-10-25 DIAGNOSIS — Z886 Allergy status to analgesic agent status: Secondary | ICD-10-CM | POA: Diagnosis not present

## 2016-10-25 DIAGNOSIS — Z9981 Dependence on supplemental oxygen: Secondary | ICD-10-CM | POA: Diagnosis not present

## 2016-10-25 DIAGNOSIS — N182 Chronic kidney disease, stage 2 (mild): Secondary | ICD-10-CM | POA: Diagnosis present

## 2016-10-25 DIAGNOSIS — D72819 Decreased white blood cell count, unspecified: Secondary | ICD-10-CM | POA: Diagnosis present

## 2016-10-25 DIAGNOSIS — K219 Gastro-esophageal reflux disease without esophagitis: Secondary | ICD-10-CM | POA: Diagnosis present

## 2016-10-25 DIAGNOSIS — I429 Cardiomyopathy, unspecified: Secondary | ICD-10-CM | POA: Diagnosis present

## 2016-10-25 DIAGNOSIS — Z8249 Family history of ischemic heart disease and other diseases of the circulatory system: Secondary | ICD-10-CM | POA: Diagnosis not present

## 2016-10-25 DIAGNOSIS — I160 Hypertensive urgency: Secondary | ICD-10-CM | POA: Diagnosis present

## 2016-10-25 DIAGNOSIS — K279 Peptic ulcer, site unspecified, unspecified as acute or chronic, without hemorrhage or perforation: Secondary | ICD-10-CM | POA: Diagnosis present

## 2016-10-25 DIAGNOSIS — I130 Hypertensive heart and chronic kidney disease with heart failure and stage 1 through stage 4 chronic kidney disease, or unspecified chronic kidney disease: Secondary | ICD-10-CM | POA: Diagnosis present

## 2016-10-25 DIAGNOSIS — J45909 Unspecified asthma, uncomplicated: Secondary | ICD-10-CM | POA: Diagnosis present

## 2016-10-25 DIAGNOSIS — Z6841 Body Mass Index (BMI) 40.0 and over, adult: Secondary | ICD-10-CM | POA: Diagnosis not present

## 2016-10-25 DIAGNOSIS — I493 Ventricular premature depolarization: Secondary | ICD-10-CM | POA: Diagnosis present

## 2016-10-25 DIAGNOSIS — G4733 Obstructive sleep apnea (adult) (pediatric): Secondary | ICD-10-CM | POA: Diagnosis present

## 2016-10-25 DIAGNOSIS — I5023 Acute on chronic systolic (congestive) heart failure: Secondary | ICD-10-CM | POA: Diagnosis present

## 2016-10-25 DIAGNOSIS — D649 Anemia, unspecified: Secondary | ICD-10-CM | POA: Diagnosis present

## 2016-10-25 DIAGNOSIS — Z9119 Patient's noncompliance with other medical treatment and regimen: Secondary | ICD-10-CM | POA: Diagnosis not present

## 2016-10-25 LAB — BASIC METABOLIC PANEL
ANION GAP: 6 (ref 5–15)
BUN: 19 mg/dL (ref 6–20)
CO2: 28 mmol/L (ref 22–32)
Calcium: 8.4 mg/dL — ABNORMAL LOW (ref 8.9–10.3)
Chloride: 106 mmol/L (ref 101–111)
Creatinine, Ser: 1.12 mg/dL (ref 0.61–1.24)
Glucose, Bld: 88 mg/dL (ref 65–99)
Potassium: 3.3 mmol/L — ABNORMAL LOW (ref 3.5–5.1)
Sodium: 140 mmol/L (ref 135–145)

## 2016-10-25 LAB — MAGNESIUM: MAGNESIUM: 2.1 mg/dL (ref 1.7–2.4)

## 2016-10-25 NOTE — Progress Notes (Signed)
PROGRESS NOTE    [name]  TXH:[id number] DOB: [birth year] DOA: 10/24/2016 PCP: Pearson Grippe, MD    Brief Narrative:  [name] is a 51 y.o. male with medical history significant for hypertension, chronic atrial fibrillation on Xarelto, chronic systolic CHF, and GERD with history of peptic ulcer disease who presents to the emergency department with 2-3 days of worsening shortness of breath, and pain and swelling in the bilateral legs. Patient reports that he had been in his usual state of health until contracting influenza approximately 2 weeks ago. He reports that the upper respiratory symptoms have resolved, but he has developed progressive pain and swelling in bilateral lower extremities which has been associated with persistent shortness of breath. The patient denies any chest pain or palpitations and denies any significant cough. He describes the lower extremity swelling mass symmetric. Bilateral lower extremity pain is described as severe, crampy, constant, localized to the lower legs bilaterally, and with no appreciable alleviating or exacerbating factors. Patient did not attempt any interventions prior to coming in. He reports adherence with his medications. He had been admitted for acute CHF 3 weeks ago and had a hospital follow-up appointment with cardiology last week, but was a no-show.   Assessment & Plan:   Principal Problem:   Acute on chronic systolic CHF (congestive heart failure) (HCC) Active Problems:   Hypertension   Peptic ulcer   GERD (gastroesophageal reflux disease)   Hypokalemia   Chronic atrial fibrillation (HCC)   OSA (obstructive sleep apnea)   Elevated troponin   Coronary arteries, normal Dec 2016   CKD (chronic kidney disease), stage II   Acute on chronic systolic CHF  - Pt presents with worsening peripheral edema and SOB, noted to have JVD and crackles  - TTE (07/02/16) with EF 30-35%, moderate-severe LVH, diffuse HK, severe LAE, and moderate MR -  Managed at home with Bumex 2 mg BID, Aldactone, Creg, and lisinopril  - fluid-restriction - follow daily wts and strict I/O's (yesterday - ) weight of 139.8 kg - diuresis with Lasix 60 mg IV q12h - continue Aldactone and Coreg - restart lisinopril initially while following daily chem panel   Chronic atrial fibrillation  - CHADS-VASc at least 2 (CHF, HTN)  - Continue Xarelto and Coreg  Hypertension with hypertensive urgency  - BP elevated to 160/110 range on presentation  - Continue Coreg, hold lisinopril initially while diuresing  - Reports allergy to hydralazine - PRN NTG   CKD stage II  - SCr is 1.09 on admission, stable  - Hold lisinopril initially and follow qAM chem panel during IV diuresis     GERD - continue protonix   Elevated troponin - flat troponins - No anginal complaints - normal coronaries in 2016 - Continue beta-blocker, statin    DVT prophylaxis: Xarelto  Code Status: Full  Family Communication: Discussed with patient Disposition Plan: diurese and discharge when closer to dry weight    Consultants:   None  Procedures:   None  Antimicrobials:   None    Subjective: Patient seen this am.  Says he missed his appointment with cardiology this past week because his ride canceled on his the last minute.  Says he has been meaning to make another appointment.  Still feeling short of breath but thinks this could be worsened secondary to allergies.  Objective: Vitals:   10/24/16 1700 10/24/16 1840 10/24/16 2028 10/25/16 0400  BP: (!) 169/131 (!) 146/104 125/90 (!) 143/99  Pulse:  80 87 85  Resp:  20 (!) 21 20  Temp:  98.6 F (37 C) 97.5 F (36.4 C) 97.6 F (36.4 C)  TempSrc:  Oral Oral Oral  SpO2:  100% 100% 97%  Weight:  (!) 139.3 kg (307 lb)  (!) 139.8 kg (308 lb 3.2 oz)  Height:  6' (1.829 m)      Intake/Output Summary (Last 24 hours) at 10/25/16 0810 Last data filed at 10/25/16 1610  Gross per 24 hour  Intake                 0 ml  Output             4900 ml  Net            -4900 ml   Filed Weights   10/24/16 0836 10/24/16 1840 10/25/16 0400  Weight: 136.1 kg (300 lb) (!) 139.3 kg (307 lb) (!) 139.8 kg (308 lb 3.2 oz)    Examination:  General exam: Appears calm and comfortable  Respiratory system: Clear to auscultation. Respiratory effort normal. Cardiovascular system: irregularly irregular rhythmn, S1 & S2 heard, RRR. No JVD, murmurs, rubs, gallops or clicks. No pedal edema. Gastrointestinal system: Abdomen is nondistended, soft and nontender. No organomegaly or masses felt. Normal bowel sounds heard. Central nervous system: Alert and oriented. No focal neurological deficits. Extremities: Symmetric 5 x 5 power, 3+ bilateral lower extremity edema. Skin: No rashes, lesions or ulcers Psychiatry: Judgement and insight appear normal. Mood & affect appropriate.     Data Reviewed: I have personally reviewed following labs and imaging studies  CBC:  Recent Labs Lab 10/24/16 0922  WBC 3.9*  NEUTROABS 2.5  HGB 9.8*  HCT 31.3*  MCV 79.8  PLT 228   Basic Metabolic Panel:  Recent Labs Lab 10/24/16 0922 10/25/16 0443  NA 140 140  K 3.1* 3.3*  CL 107 106  CO2 25 28  GLUCOSE 101* 88  BUN 20 19  CREATININE 1.09 1.12  CALCIUM 8.6* 8.4*  MG  --  2.1   GFR: Estimated Creatinine Clearance: 116.9 mL/min (by C-G formula based on SCr of 1.12 mg/dL). Liver Function Tests:  Recent Labs Lab 10/24/16 0922  AST 24  ALT 18  ALKPHOS 71  BILITOT 1.7*  PROT 6.6  ALBUMIN 3.4*   No results for input(s): LIPASE, AMYLASE in the last 168 hours. No results for input(s): AMMONIA in the last 168 hours. Coagulation Profile: No results for input(s): INR, PROTIME in the last 168 hours. Cardiac Enzymes:  Recent Labs Lab 10/24/16 0922 10/24/16 1159  TROPONINI 0.03* 0.03*   BNP (last 3 results) No results for input(s): PROBNP in the last 8760 hours. HbA1C: No results for input(s): HGBA1C in the  last 72 hours. CBG: No results for input(s): GLUCAP in the last 168 hours. Lipid Profile: No results for input(s): CHOL, HDL, LDLCALC, TRIG, CHOLHDL, LDLDIRECT in the last 72 hours. Thyroid Function Tests: No results for input(s): TSH, T4TOTAL, FREET4, T3FREE, THYROIDAB in the last 72 hours. Anemia Panel: No results for input(s): VITAMINB12, FOLATE, FERRITIN, TIBC, IRON, RETICCTPCT in the last 72 hours. Sepsis Labs: No results for input(s): PROCALCITON, LATICACIDVEN in the last 168 hours.  No results found for this or any previous visit (from the past 240 hour(s)).       Radiology Studies: Dg Chest 2 View  Result Date: 10/24/2016 CLINICAL DATA:  Dyspnea EXAM: CHEST  2 VIEW COMPARISON:  10/16/2016 chest radiograph. FINDINGS: Stable cardiomediastinal silhouette with mild cardiomegaly. No pneumothorax. No pleural effusion.  Mild to moderate pulmonary edema. IMPRESSION: Mild-to-moderate congestive heart failure. Electronically Signed   By: Delbert Phenix M.D.   On: 10/24/2016 09:29        Scheduled Meds: . atorvastatin  40 mg Oral q1800  . carvedilol  25 mg Oral BID WC  . ferrous gluconate  324 mg Oral BID WC  . furosemide  60 mg Intravenous Q12H  . pantoprazole  40 mg Oral QPM  . rivaroxaban  20 mg Oral Q supper  . sodium chloride flush  3 mL Intravenous Q12H  . spironolactone  50 mg Oral Daily  . tamsulosin  0.4 mg Oral Daily   Continuous Infusions: . sodium chloride       LOS: 0 days    Time spent: 30 minutes    Katrinka Blazing, MD [facility] Pager [phone]  If 7PM-7AM, please contact night-coverage [email or web address] Password [email or web address] 10/25/2016, 8:10 AM

## 2016-10-25 NOTE — Progress Notes (Signed)
Notified from CCMD that pt had a 8 beat run of v-tach. Pt stable and asymptomatic. MD notified. Will continue to monitor.

## 2016-10-26 LAB — BASIC METABOLIC PANEL
ANION GAP: 7 (ref 5–15)
Anion gap: 7 (ref 5–15)
BUN: 24 mg/dL — AB (ref 6–20)
BUN: 24 mg/dL — AB (ref 6–20)
CALCIUM: 8.4 mg/dL — AB (ref 8.9–10.3)
CALCIUM: 8.5 mg/dL — AB (ref 8.9–10.3)
CHLORIDE: 104 mmol/L (ref 101–111)
CO2: 28 mmol/L (ref 22–32)
CO2: 29 mmol/L (ref 22–32)
CREATININE: 1.34 mg/dL — AB (ref 0.61–1.24)
CREATININE: 1.41 mg/dL — AB (ref 0.61–1.24)
Chloride: 106 mmol/L (ref 101–111)
GFR calc non Af Amer: >60 mL/min (ref >60)
GFR calc non Af Amer: 58 mL/min — ABNORMAL LOW (ref >60)
Glucose, Bld: 88 mg/dL (ref 65–99)
Glucose, Bld: 103 mg/dL — ABNORMAL HIGH (ref 65–99)
Potassium: 3.3 mmol/L — ABNORMAL LOW (ref 3.5–5.1)
Potassium: 3.5 mmol/L (ref 3.5–5.1)
SODIUM: 140 mmol/L (ref 135–145)
Sodium: 141 mmol/L (ref 135–145)

## 2016-10-26 MED ORDER — POTASSIUM CHLORIDE CRYS ER 20 MEQ PO TBCR
40.0000 meq | EXTENDED_RELEASE_TABLET | Freq: Two times a day (BID) | ORAL | Status: DC
  Administered 2016-10-26 – 2016-10-27 (×3): 40 meq via ORAL
  Filled 2016-10-26 (×3): qty 2

## 2016-10-26 MED ORDER — FUROSEMIDE 10 MG/ML IJ SOLN: 40.0000 mg | Freq: Two times a day (BID) | INTRAMUSCULAR | Status: DC

## 2016-10-26 NOTE — Progress Notes (Signed)
PROGRESS NOTE    [name]  MAY:[id number] DOB: [birth year] DOA: 10/24/2016 PCP: Pearson Grippe, MD    Brief Narrative:  [name] is a 51 y.o. male with medical history significant for hypertension, chronic atrial fibrillation on Xarelto, chronic systolic CHF, and GERD with history of peptic ulcer disease who presents to the emergency department with 2-3 days of worsening shortness of breath, and pain and swelling in the bilateral legs. Patient reports that he had been in his usual state of health until contracting influenza approximately 2 weeks ago. He reports that the upper respiratory symptoms have resolved, but he has developed progressive pain and swelling in bilateral lower extremities which has been associated with persistent shortness of breath. The patient denies any chest pain or palpitations and denies any significant cough. He describes the lower extremity swelling mass symmetric. Bilateral lower extremity pain is described as severe, crampy, constant, localized to the lower legs bilaterally, and with no appreciable alleviating or exacerbating factors. Patient did not attempt any interventions prior to coming in. He reports adherence with his medications. He had been admitted for acute CHF 3 weeks ago and had a hospital follow-up appointment with cardiology last week, but was a no-show.   Assessment & Plan:   Principal Problem:   Acute on chronic systolic CHF (congestive heart failure) (HCC) Active Problems:   Hypertension   Peptic ulcer   GERD (gastroesophageal reflux disease)   Hypokalemia   Chronic atrial fibrillation (HCC)   OSA (obstructive sleep apnea)   Elevated troponin   Coronary arteries, normal Dec 2016   CKD (chronic kidney disease), stage II   Acute on chronic systolic CHF  - Pt presents with worsening peripheral edema and SOB, noted to have JVD and crackles  - TTE (07/02/16) with EF 30-35%, moderate-severe LVH, diffuse HK, severe LAE, and moderate MR -  Managed at home with Bumex 2 mg BID, Aldactone, Creg, and lisinopril  - fluid-restriction - follow daily wts and strict I/O's (yesterday - ) weight of 136.5kg today - 2/2 rising creatinine will stop IV lasix - transition to PO lasix - last documented dry weight of closer to 270lbs - continue Aldactone and Coreg - repeat BMP in am  Chronic atrial fibrillation  - CHADS-VASc at least 2 (CHF, HTN)  - Continue Xarelto and Coreg  Hypertension with hypertensive urgency  - Continue Coreg - hold lisinopril initially while diuresing  - Reports allergy to hydralazine - PRN NTG  - BP controlled today  CKD stage II  - SCr is 1.09 on admission - increase in creatinine to 1.41 today - will repeat BMP in am - change to PO lasix    GERD - continue protonix   Elevated troponin - flat troponins - normal coronaries in 2016 - Continue beta-blocker, statin    DVT prophylaxis: Xarelto  Code Status: Full  Family Communication: Discussed with patient Disposition Plan: discharge pending continued diuresis and improvement in renal function    Consultants:   None  Procedures:   None  Antimicrobials:   None    Subjective: Patient reports he feels slightly better this morning.  Reports that his legs are feeling less heavy.    Objective: Vitals:   10/25/16 1209 10/25/16 1938 10/26/16 0404 10/26/16 1325  BP: 110/72 (!) 100/55 (!) 126/98 112/85  Pulse: 76 65 74 90  Resp: 18 20 18 18   Temp:  97.9 F (36.6 C) 98.5 F (36.9 C) 98.3 F (36.8 C)  TempSrc:  Oral Oral  Oral  SpO2: 99% 96% 97% 90%  Weight:   (!) 136.5 kg (301 lb)   Height:        Intake/Output Summary (Last 24 hours) at 10/26/16 1523 Last data filed at 10/26/16 1100  Gross per 24 hour  Intake                0 ml  Output             3800 ml  Net            -3800 ml   Filed Weights   10/24/16 1840 10/25/16 0400 10/26/16 0404  Weight: (!) 139.3 kg (307 lb) (!) 139.8 kg (308 lb 3.2 oz) (!)  136.5 kg (301 lb)    Examination:  General exam: Appears calm and comfortable  Respiratory system: Few crackles in bases bilaterally Cardiovascular system: irregularly irregular rhythmn, S1 & S2 heard, RRR. No JVD, murmurs, rubs, gallops or clicks. Fairly significant edema of the lower extremities bilaterally Gastrointestinal system: Abdomen is nondistended, soft and nontender. No organomegaly or masses felt. Normal bowel sounds heard. Central nervous system: Alert and oriented. No focal neurological deficits. Extremities: Symmetric 5 x 5 power, 3+ bilateral lower extremity edema. Skin: No rashes, lesions or ulcers Psychiatry: Judgement and insight appear normal. Mood & affect appropriate.     Data Reviewed: I have personally reviewed following labs and imaging studies  CBC:  Recent Labs Lab 10/24/16 0922  WBC 3.9*  NEUTROABS 2.5  HGB 9.8*  HCT 31.3*  MCV 79.8  PLT 228   Basic Metabolic Panel:  Recent Labs Lab 10/24/16 0922 10/25/16 0443 10/26/16 0448 10/26/16 1343  NA 140 140 141 140  K 3.1* 3.3* 3.3* 3.5  CL 107 106 106 104  CO2 25 28 28 29   GLUCOSE 101* 88 88 103*  BUN 20 19 24* 24*  CREATININE 1.09 1.12 1.34* 1.41*  CALCIUM 8.6* 8.4* 8.4* 8.5*  MG  --  2.1  --   --    GFR: Estimated Creatinine Clearance: 91.7 mL/min (A) (by C-G formula based on SCr of 1.41 mg/dL (H)). Liver Function Tests:  Recent Labs Lab 10/24/16 0922  AST 24  ALT 18  ALKPHOS 71  BILITOT 1.7*  PROT 6.6  ALBUMIN 3.4*   No results for input(s): LIPASE, AMYLASE in the last 168 hours. No results for input(s): AMMONIA in the last 168 hours. Coagulation Profile: No results for input(s): INR, PROTIME in the last 168 hours. Cardiac Enzymes:  Recent Labs Lab 10/24/16 0922 10/24/16 1159  TROPONINI 0.03* 0.03*   BNP (last 3 results) No results for input(s): PROBNP in the last 8760 hours. HbA1C: No results for input(s): HGBA1C in the last 72 hours. CBG: No results for input(s):  GLUCAP in the last 168 hours. Lipid Profile: No results for input(s): CHOL, HDL, LDLCALC, TRIG, CHOLHDL, LDLDIRECT in the last 72 hours. Thyroid Function Tests: No results for input(s): TSH, T4TOTAL, FREET4, T3FREE, THYROIDAB in the last 72 hours. Anemia Panel: No results for input(s): VITAMINB12, FOLATE, FERRITIN, TIBC, IRON, RETICCTPCT in the last 72 hours. Sepsis Labs: No results for input(s): PROCALCITON, LATICACIDVEN in the last 168 hours.  No results found for this or any previous visit (from the past 240 hour(s)).       Radiology Studies: No results found.      Scheduled Meds: . atorvastatin  40 mg Oral q1800  . carvedilol  25 mg Oral BID WC  . ferrous gluconate  324 mg Oral BID  WC  . pantoprazole  40 mg Oral QPM  . potassium chloride  40 mEq Oral BID  . rivaroxaban  20 mg Oral Q supper  . sodium chloride flush  3 mL Intravenous Q12H  . spironolactone  50 mg Oral Daily  . tamsulosin  0.4 mg Oral Daily   Continuous Infusions: . sodium chloride       LOS: 1 day    Time spent: 35 minutes    Katrinka Blazing, MD [facility] Pager [phone]  If 7PM-7AM, please contact night-coverage [email or web address] Password [facility] 10/26/2016, 3:23 PM

## 2016-10-27 LAB — BASIC METABOLIC PANEL
ANION GAP: 6 (ref 5–15)
BUN: 25 mg/dL — ABNORMAL HIGH (ref 6–20)
CALCIUM: 8.3 mg/dL — AB (ref 8.9–10.3)
CHLORIDE: 105 mmol/L (ref 101–111)
CO2: 28 mmol/L (ref 22–32)
CREATININE: 1.37 mg/dL — AB (ref 0.61–1.24)
GFR calc non Af Amer: 60 mL/min — ABNORMAL LOW (ref >60)
Glucose, Bld: 96 mg/dL (ref 65–99)
Potassium: 3.7 mmol/L (ref 3.5–5.1)
SODIUM: 139 mmol/L (ref 135–145)

## 2016-10-27 MED ORDER — POTASSIUM CHLORIDE CRYS ER 20 MEQ PO TBCR: 40.0000 meq | EXTENDED_RELEASE_TABLET | Freq: Every day | ORAL | 0 refills | Status: DC

## 2016-10-27 NOTE — Care Management Note (Signed)
Case Management Note  Patient Details  Name: [name] MRN: [id number] Date of Birth: [birth year]  Subjective/Objective:                  Pt admitted with CHF. Pt well known to CM team. He is from home, lives with his mother, Pt is ind with ALD's. He has home oxygen. He is very non-compliant. He has been discharged from all community support programs, including multiple HH agencies, and [facility] para-medicine programs due to his non-compliance.   Action/Plan: Pt will DC back home with self care.   Expected Discharge Date:     10/27/2016             Expected Discharge Plan:  Home/Self Care  In-House Referral:  NA  Discharge planning Services  CM Consult  Post Acute Care Choice:  NA Choice offered to:  NA  Status of Service:  Completed, signed off  [name], RN 10/27/2016, 9:52 AM

## 2016-10-27 NOTE — Discharge Summary (Signed)
Physician Discharge Summary  [name] OIT:[id number] DOB: 1965-12-08 DOA: 10/24/2016  PCP: Pearson Grippe, MD  Admit date: 10/24/2016 Discharge date: 10/28/2016  Admitted From: Home Disposition:  Home  Recommendations for Outpatient Follow-up:  1. Follow up with PCP in 1-2 weeks 2. Please reschedule your follow up with [place] [facility] 3. Please obtain BMP/CBC in one week 4. Take medications as prescribed  Home Health: No Equipment/Devices: Oxygen at 2L [place]    Discharge Condition: Stable  CODE STATUS: Full Code  Diet recommendation: Heart Healthy  Brief/Interim Summary: Aldan T Archeris a 51 y.o.malewith medical history significant for hypertension, chronic atrial fibrillation on Xarelto, chronic systolic CHF, and GERD with history of peptic ulcer disease who presents to the emergency department with 2-3 days of worsening shortness of breath, and pain and swelling in the bilateral legs. Patient reports that he had been in his usual state of health until contracting influenza approximately 2 weeks ago. He reports that the upper respiratory symptoms have resolved, but he has developed progressive pain and swelling in bilateral lower extremities which has been associated with persistent shortness of breath. The patient denies any chest pain or palpitations and denies any significant cough. He describes the lower extremity swelling mass symmetric. Bilateral lower extremity pain is described as severe, crampy, constant, localized to the lower legs bilaterally, and with no appreciable alleviating or exacerbating factors. Patient did not attempt any interventions prior to coming in. He reports adherence with his medications. He had been admitted for acute CHF 3 weeks ago and had a hospital follow-up appointment with cardiology last week, but was a no-show.  Patient was admitted and diuresed.  When his creatinine began to rise he was switched to PO lasix.  He was discharged on his home diuretic  regimen.  Per patient he has a cardiologist in [place] that he sees and did not need a follow up.  He stated he would make his own follow up with the [place] [facility] clinic. He also mentioned at discharge he needed no refills of his medication and that he would be able to make an appointment with his PCP for within a week of discharge.  He was diuresed 9.4L during hospitalization.  Discharge Diagnoses:  Principal Problem:   Acute on chronic systolic CHF (congestive heart failure) (HCC) Active Problems:   Hypertension   Peptic ulcer   GERD (gastroesophageal reflux disease)   Hypokalemia   Chronic atrial fibrillation (HCC)   OSA (obstructive sleep apnea)   Elevated troponin   Coronary arteries, normal Dec 2016   CKD (chronic kidney disease), stage II    Discharge Instructions  Discharge Instructions    Call MD for:  difficulty breathing, headache or visual disturbances    Complete by:  As directed    Call MD for:  extreme fatigue    Complete by:  As directed    Call MD for:  hives    Complete by:  As directed    Call MD for:  persistant dizziness or light-headedness    Complete by:  As directed    Call MD for:  persistant nausea and vomiting    Complete by:  As directed    Call MD for:  severe uncontrolled pain    Complete by:  As directed    Call MD for:  temperature >100.4    Complete by:  As directed    Diet - low sodium heart healthy    Complete by:  As directed  Discharge instructions    Complete by:  As directed    Please schedule follow up with your PCP within 2 weeks Reschedule your appointment with cardiology/ Heart Failure clinic in [place] within 2 weeks Take medications as prescribed   Increase activity slowly    Complete by:  As directed      Allergies as of 10/27/2016      Reactions   Bee Venom Shortness Of Breath, Swelling, Other (See Comments)   Reaction:  Facial swelling   Food Shortness Of Breath, Rash, Other (See Comments)   Pt states that he is  allergic to strawberries.     Lipitor [atorvastatin] Shortness Of Breath, Other (See Comments)   Reaction:  Nose bleeds    Aspirin Other (See Comments)   Reaction:  GI upset    Banana Diarrhea   Metolazone Other (See Comments)   Pt states that he stopped taking this med due to heart attack like symptoms.    Oatmeal Nausea And Vomiting   Orange Juice [orange Oil] Nausea And Vomiting   Torsemide Swelling, Other (See Comments)   Reaction:  Swelling of feet/legs    Diltiazem Palpitations   Hydralazine Palpitations      Medication List    TAKE these medications   albuterol (2.5 MG/3ML) 0.083% nebulizer solution Commonly known as:  PROVENTIL Take 2.5 mg by nebulization every 6 (six) hours as needed for wheezing or shortness of breath.   atorvastatin 40 MG tablet Commonly known as:  LIPITOR Take 1 tablet (40 mg total) by mouth daily at 6 PM.   beclomethasone 80 MCG/ACT inhaler Commonly known as:  QVAR Inhale 2 puffs into the lungs 2 (two) times daily.   bumetanide 2 MG tablet Commonly known as:  BUMEX Take 1 tablet (2 mg total) by mouth 2 (two) times daily.   carvedilol 25 MG tablet Commonly known as:  COREG Take 1 tablet (25 mg total) by mouth 2 (two) times daily with a meal.   docusate sodium 100 MG capsule Commonly known as:  COLACE Take 1 capsule (100 mg total) by mouth 2 (two) times daily. What changed:  when to take this  reasons to take this   ferrous gluconate 324 MG tablet Commonly known as:  FERGON Take 1 tablet (324 mg total) by mouth 2 (two) times daily with a meal.   lisinopril 5 MG tablet Commonly known as:  PRINIVIL,ZESTRIL Take 1 tablet (5 mg total) by mouth daily.   OXYGEN Inhale 3 L into the lungs daily.   pantoprazole 40 MG tablet Commonly known as:  PROTONIX Take 1 tablet (40 mg total) by mouth daily at 6 (six) AM. What changed:  when to take this   potassium chloride SA 20 MEQ tablet Commonly known as:  K-DUR,KLOR-CON Take 2 tablets (40  mEq total) by mouth daily.   rivaroxaban 20 MG Tabs tablet Commonly known as:  XARELTO Take 1 tablet (20 mg total) by mouth daily with breakfast. What changed:  when to take this   spironolactone 50 MG tablet Commonly known as:  ALDACTONE Take 1 tablet (50 mg total) by mouth daily.   tamsulosin 0.4 MG Caps capsule Commonly known as:  FLOMAX Take 1 capsule (0.4 mg total) by mouth daily.   torsemide 20 MG tablet Commonly known as:  DEMADEX Take 20 mg by mouth daily as needed (fluid).      Follow-up Information    Pearson Grippe, MD On 10/29/2016.   Specialty:  Internal Medicine Why:  at 10:20 am Contact information: [name] [place] [address] [phone]          Allergies  Allergen Reactions  . Bee Venom Shortness Of Breath, Swelling and Other (See Comments)    Reaction:  Facial swelling  . Food Shortness Of Breath, Rash and Other (See Comments)    Pt states that he is allergic to strawberries.    . Lipitor [Atorvastatin] Shortness Of Breath and Other (See Comments)    Reaction:  Nose bleeds   . Aspirin Other (See Comments)    Reaction:  GI upset   . Banana Diarrhea  . Metolazone Other (See Comments)    Pt states that he stopped taking this med due to heart attack like symptoms.   . Oatmeal Nausea And Vomiting  . Orange Juice [Orange Oil] Nausea And Vomiting  . Torsemide Swelling and Other (See Comments)    Reaction:  Swelling of feet/legs   . Diltiazem Palpitations  . Hydralazine Palpitations    Consultations:  None   Procedures/Studies: Dg Chest 2 View  Result Date: 10/24/2016 CLINICAL DATA:  Dyspnea EXAM: CHEST  2 VIEW COMPARISON:  10/16/2016 chest radiograph. FINDINGS: Stable cardiomediastinal silhouette with mild cardiomegaly. No pneumothorax. No pleural effusion. Mild to moderate pulmonary edema. IMPRESSION: Mild-to-moderate congestive heart failure. Electronically Signed   By: Delbert Phenix M.D.   On: 10/24/2016 09:29   Dg Chest Port 1  View  Result Date: 10/01/2016 CLINICAL DATA:  Dyspnea, history of asthma EXAM: PORTABLE CHEST 1 VIEW COMPARISON:  09/19/2016 FINDINGS: Stable cardiomegaly. No aortic aneurysm. Diffuse airspace opacities consistent with pulmonary edema. No significant effusion. No pneumothorax identified. No acute nor suspicious osseous abnormality. IMPRESSION: Stable cardiomegaly with CHF. Findings are similar to that of prior exam. Electronically Signed   By: Tollie Eth M.D.   On: 10/01/2016 01:26      Subjective: Patient says he feels well today.  Voices he feels ready to leave and will schedule his own follow ups.   Discharge Exam: Vitals:   10/26/16 2037 10/27/16 0500  BP: (!) 115/91 124/72  Pulse: 72 78  Resp: 18 18  Temp: 97.4 F (36.3 C) 98.3 F (36.8 C)   Vitals:   10/26/16 0404 10/26/16 1325 10/26/16 2037 10/27/16 0500  BP: (!) 126/98 112/85 (!) 115/91 124/72  Pulse: 74 90 72 78  Resp: 18 18 18 18   Temp: 98.5 F (36.9 C) 98.3 F (36.8 C) 97.4 F (36.3 C) 98.3 F (36.8 C)  TempSrc: Oral Oral Oral Oral  SpO2: 97% 90% 98% 100%  Weight: (!) 136.5 kg (301 lb)   135.4 kg (298 lb 9.6 oz)  Height:        General: Pt is alert, awake, not in acute distress Cardiovascular: irregularly irregular, S1/S2 +, no rubs, no gallops Respiratory: CTA bilaterally, no wheezing, no rhonchi Abdominal: Soft, NT, ND, bowel sounds + Extremities: no edema, no cyanosis    The results of significant diagnostics from this hospitalization (including imaging, microbiology, ancillary and laboratory) are listed below for reference.     Microbiology: No results found for this or any previous visit (from the past 240 hour(s)).   Labs: BNP (last 3 results)  Recent Labs  09/18/16 2036 10/01/16 0115 10/24/16 0923  BNP 660.0* 768.0* 947.0*   Basic Metabolic Panel:  Recent Labs Lab 10/24/16 0922 10/25/16 0443 10/26/16 0448 10/26/16 1343 10/27/16 0419  NA 140 140 141 140 139  K 3.1* 3.3* 3.3* 3.5  3.7  CL 107  106 106 104 105  CO2 25 28 28 29 28   GLUCOSE 101* 88 88 103* 96  BUN 20 19 24* 24* 25*  CREATININE 1.09 1.12 1.34* 1.41* 1.37*  CALCIUM 8.6* 8.4* 8.4* 8.5* 8.3*  MG  --  2.1  --   --   --    Liver Function Tests:  Recent Labs Lab 10/24/16 0922  AST 24  ALT 18  ALKPHOS 71  BILITOT 1.7*  PROT 6.6  ALBUMIN 3.4*   No results for input(s): LIPASE, AMYLASE in the last 168 hours. No results for input(s): AMMONIA in the last 168 hours. CBC:  Recent Labs Lab 10/24/16 0922  WBC 3.9*  NEUTROABS 2.5  HGB 9.8*  HCT 31.3*  MCV 79.8  PLT 228   Cardiac Enzymes:  Recent Labs Lab 10/24/16 0922 10/24/16 1159  TROPONINI 0.03* 0.03*   BNP: Invalid input(s): POCBNP CBG: No results for input(s): GLUCAP in the last 168 hours. D-Dimer No results for input(s): DDIMER in the last 72 hours. Hgb A1c No results for input(s): HGBA1C in the last 72 hours. Lipid Profile No results for input(s): CHOL, HDL, LDLCALC, TRIG, CHOLHDL, LDLDIRECT in the last 72 hours. Thyroid function studies No results for input(s): TSH, T4TOTAL, T3FREE, THYROIDAB in the last 72 hours.  Invalid input(s): FREET3 Anemia work up No results for input(s): VITAMINB12, FOLATE, FERRITIN, TIBC, IRON, RETICCTPCT in the last 72 hours. Urinalysis    Component Value Date/Time   COLORURINE AMBER (A) 10/24/2016 0923   APPEARANCEUR CLEAR 10/24/2016 0923   LABSPEC 1.027 10/24/2016 0923   PHURINE 5.0 10/24/2016 0923   GLUCOSEU NEGATIVE 10/24/2016 0923   GLUCOSEU NEG mg/dL 40/98/1191 4782   HGBUR NEGATIVE 10/24/2016 0923   BILIRUBINUR NEGATIVE 10/24/2016 0923   KETONESUR NEGATIVE 10/24/2016 0923   PROTEINUR 100 (A) 10/24/2016 0923   UROBILINOGEN 0.2 08/08/2014 1445   NITRITE NEGATIVE 10/24/2016 0923   LEUKOCYTESUR NEGATIVE 10/24/2016 0923   Sepsis Labs Invalid input(s): PROCALCITONIN,  WBC,  LACTICIDVEN Microbiology No results found for this or any previous visit (from the past 240 hour(s)).   Time  coordinating discharge: 40 minutes  SIGNED:   Katrinka Blazing, MD  [facility] 10/28/2016, 11:44 AM Pager [phone] If 7PM-7AM, please contact night-coverage [email or web address] Password [email or web address]

## 2016-10-27 NOTE — Progress Notes (Signed)
Pt's IV catheter removed and intact. Pt's IV site clean dry and intact. Discharge instructions including medications and follow up appointments were reviewed and discussed with patient. All questions were answered and no further questions at this time. Pt verbalized understanding of discharge instructions.  Pt in stable condition and in no acute distress at time of discharge. Pt will be escorted by nurse tech.  

## 2016-10-27 NOTE — Care Management Important Message (Signed)
Important Message  Patient Details  Name: [name] MRN: [id number] Date of Birth: [birth year]   Medicare Important Message Given:  Yes    [name], RN 10/27/2016, 9:51 AM

## 2016-11-09 ENCOUNTER — Encounter (HOSPITAL_COMMUNITY): Payer: Self-pay | Admitting: Cardiology

## 2016-11-09 ENCOUNTER — Inpatient Hospital Stay (HOSPITAL_COMMUNITY)
Admission: EM | Admit: 2016-11-09 | Discharge: 2016-11-13 | DRG: 291 | Disposition: A | Discharge status: Home / Self Care | Payer: Medicare Other | Attending: Internal Medicine | Admitting: Internal Medicine

## 2016-11-09 ENCOUNTER — Emergency Department (HOSPITAL_COMMUNITY): Payer: Medicare Other

## 2016-11-09 DIAGNOSIS — M79604 Pain in right leg: Secondary | ICD-10-CM

## 2016-11-09 DIAGNOSIS — R7989 Other specified abnormal findings of blood chemistry: Secondary | ICD-10-CM

## 2016-11-09 DIAGNOSIS — Z6841 Body Mass Index (BMI) 40.0 and over, adult: Secondary | ICD-10-CM | POA: Diagnosis not present

## 2016-11-09 DIAGNOSIS — N182 Chronic kidney disease, stage 2 (mild): Secondary | ICD-10-CM | POA: Diagnosis present

## 2016-11-09 DIAGNOSIS — I429 Cardiomyopathy, unspecified: Secondary | ICD-10-CM | POA: Diagnosis present

## 2016-11-09 DIAGNOSIS — Z9119 Patient's noncompliance with other medical treatment and regimen: Secondary | ICD-10-CM | POA: Diagnosis not present

## 2016-11-09 DIAGNOSIS — R748 Abnormal levels of other serum enzymes: Secondary | ICD-10-CM | POA: Diagnosis not present

## 2016-11-09 DIAGNOSIS — Z9111 Patient's noncompliance with dietary regimen: Secondary | ICD-10-CM | POA: Diagnosis not present

## 2016-11-09 DIAGNOSIS — Z7951 Long term (current) use of inhaled steroids: Secondary | ICD-10-CM | POA: Diagnosis not present

## 2016-11-09 DIAGNOSIS — Z79899 Other long term (current) drug therapy: Secondary | ICD-10-CM

## 2016-11-09 DIAGNOSIS — Z888 Allergy status to other drugs, medicaments and biological substances status: Secondary | ICD-10-CM

## 2016-11-09 DIAGNOSIS — J45909 Unspecified asthma, uncomplicated: Secondary | ICD-10-CM | POA: Diagnosis present

## 2016-11-09 DIAGNOSIS — Z9114 Patient's other noncompliance with medication regimen: Secondary | ICD-10-CM

## 2016-11-09 DIAGNOSIS — I509 Heart failure, unspecified: Secondary | ICD-10-CM

## 2016-11-09 DIAGNOSIS — I428 Other cardiomyopathies: Secondary | ICD-10-CM | POA: Diagnosis present

## 2016-11-09 DIAGNOSIS — I5023 Acute on chronic systolic (congestive) heart failure: Secondary | ICD-10-CM | POA: Diagnosis present

## 2016-11-09 DIAGNOSIS — Z9981 Dependence on supplemental oxygen: Secondary | ICD-10-CM | POA: Diagnosis not present

## 2016-11-09 DIAGNOSIS — G4733 Obstructive sleep apnea (adult) (pediatric): Secondary | ICD-10-CM | POA: Diagnosis present

## 2016-11-09 DIAGNOSIS — Z8249 Family history of ischemic heart disease and other diseases of the circulatory system: Secondary | ICD-10-CM | POA: Diagnosis not present

## 2016-11-09 DIAGNOSIS — K219 Gastro-esophageal reflux disease without esophagitis: Secondary | ICD-10-CM | POA: Diagnosis present

## 2016-11-09 DIAGNOSIS — Z91030 Bee allergy status: Secondary | ICD-10-CM

## 2016-11-09 DIAGNOSIS — I10 Essential (primary) hypertension: Secondary | ICD-10-CM | POA: Diagnosis present

## 2016-11-09 DIAGNOSIS — Z7901 Long term (current) use of anticoagulants: Secondary | ICD-10-CM | POA: Diagnosis not present

## 2016-11-09 DIAGNOSIS — R600 Localized edema: Secondary | ICD-10-CM | POA: Diagnosis present

## 2016-11-09 DIAGNOSIS — E784 Other hyperlipidemia: Secondary | ICD-10-CM | POA: Diagnosis not present

## 2016-11-09 DIAGNOSIS — I361 Nonrheumatic tricuspid (valve) insufficiency: Secondary | ICD-10-CM | POA: Diagnosis not present

## 2016-11-09 DIAGNOSIS — Z823 Family history of stroke: Secondary | ICD-10-CM | POA: Diagnosis not present

## 2016-11-09 DIAGNOSIS — I130 Hypertensive heart and chronic kidney disease with heart failure and stage 1 through stage 4 chronic kidney disease, or unspecified chronic kidney disease: Secondary | ICD-10-CM | POA: Diagnosis present

## 2016-11-09 DIAGNOSIS — I482 Chronic atrial fibrillation, unspecified: Secondary | ICD-10-CM | POA: Diagnosis present

## 2016-11-09 DIAGNOSIS — E662 Morbid (severe) obesity with alveolar hypoventilation: Secondary | ICD-10-CM | POA: Diagnosis not present

## 2016-11-09 DIAGNOSIS — Z87891 Personal history of nicotine dependence: Secondary | ICD-10-CM | POA: Diagnosis not present

## 2016-11-09 DIAGNOSIS — E669 Obesity, unspecified: Secondary | ICD-10-CM | POA: Diagnosis present

## 2016-11-09 DIAGNOSIS — E785 Hyperlipidemia, unspecified: Secondary | ICD-10-CM | POA: Diagnosis present

## 2016-11-09 DIAGNOSIS — Z91018 Allergy to other foods: Secondary | ICD-10-CM

## 2016-11-09 DIAGNOSIS — I252 Old myocardial infarction: Secondary | ICD-10-CM | POA: Diagnosis not present

## 2016-11-09 DIAGNOSIS — Z886 Allergy status to analgesic agent status: Secondary | ICD-10-CM

## 2016-11-09 DIAGNOSIS — R778 Other specified abnormalities of plasma proteins: Secondary | ICD-10-CM | POA: Diagnosis present

## 2016-11-09 LAB — BASIC METABOLIC PANEL
ANION GAP: 7 (ref 5–15)
BUN: 20 mg/dL (ref 6–20)
CALCIUM: 8.6 mg/dL — AB (ref 8.9–10.3)
CO2: 27 mmol/L (ref 22–32)
CREATININE: 1.13 mg/dL (ref 0.61–1.24)
Chloride: 105 mmol/L (ref 101–111)
GFR calc Af Amer: >60 mL/min (ref >60)
GFR calc non Af Amer: >60 mL/min (ref >60)
Glucose, Bld: 93 mg/dL (ref 65–99)
Potassium: 3.1 mmol/L — ABNORMAL LOW (ref 3.5–5.1)
SODIUM: 139 mmol/L (ref 135–145)

## 2016-11-09 LAB — CBC
HCT: 32.1 % — ABNORMAL LOW (ref 39.0–52.0)
HEMOGLOBIN: 9.9 g/dL — AB (ref 13.0–17.0)
MCH: 24.5 pg — ABNORMAL LOW (ref 26.0–34.0)
MCHC: 30.8 g/dL (ref 30.0–36.0)
MCV: 79.5 fL (ref 78.0–100.0)
PLATELETS: 230 10*3/uL (ref 150–400)
RBC: 4.04 MIL/uL — AB (ref 4.22–5.81)
RDW: 17.1 % — AB (ref 11.5–15.5)
WBC: 4.3 10*3/uL (ref 4.0–10.5)

## 2016-11-09 LAB — BRAIN NATRIURETIC PEPTIDE: B NATRIURETIC PEPTIDE 5: 770 pg/mL — AB (ref 0.0–100.0)

## 2016-11-09 LAB — TROPONIN I
TROPONIN I: 0.04 ng/mL — AB (ref <0.03)
TROPONIN I: 0.03 ng/mL — AB (ref <0.03)
TROPONIN I: 0.03 ng/mL — AB (ref <0.03)

## 2016-11-09 MED ORDER — FUROSEMIDE 10 MG/ML IJ SOLN
60.0000 mg | Freq: Two times a day (BID) | INTRAMUSCULAR | Status: DC
  Administered 2016-11-09: 60 mg via INTRAVENOUS
  Filled 2016-11-09: qty 6

## 2016-11-09 MED ORDER — POTASSIUM CHLORIDE CRYS ER 20 MEQ PO TBCR
40.0000 meq | EXTENDED_RELEASE_TABLET | Freq: Once | ORAL | Status: AC
  Administered 2016-11-09: 40 meq via ORAL
  Filled 2016-11-09: qty 2

## 2016-11-09 MED ORDER — LISINOPRIL 5 MG PO TABS
5.0000 mg | ORAL_TABLET | Freq: Every day | ORAL | Status: DC
  Administered 2016-11-10 – 2016-11-13 (×4): 5 mg via ORAL
  Filled 2016-11-09 (×4): qty 1

## 2016-11-09 MED ORDER — TAMSULOSIN HCL 0.4 MG PO CAPS
0.4000 mg | ORAL_CAPSULE | Freq: Every day | ORAL | Status: DC
  Administered 2016-11-09 – 2016-11-13 (×5): 0.4 mg via ORAL
  Filled 2016-11-09 (×5): qty 1

## 2016-11-09 MED ORDER — FUROSEMIDE 10 MG/ML IJ SOLN
40.0000 mg | Freq: Once | INTRAMUSCULAR | Status: AC
  Administered 2016-11-09: 40 mg via INTRAVENOUS
  Filled 2016-11-09: qty 4

## 2016-11-09 MED ORDER — SPIRONOLACTONE 25 MG PO TABS
50.0000 mg | ORAL_TABLET | Freq: Every day | ORAL | Status: DC
  Administered 2016-11-09 – 2016-11-13 (×5): 50 mg via ORAL
  Filled 2016-11-09 (×5): qty 2

## 2016-11-09 MED ORDER — RIVAROXABAN 20 MG PO TABS
20.0000 mg | ORAL_TABLET | Freq: Every day | ORAL | Status: DC
  Administered 2016-11-09 – 2016-11-12 (×4): 20 mg via ORAL
  Filled 2016-11-09 (×4): qty 1

## 2016-11-09 MED ORDER — ACETAMINOPHEN 650 MG RE SUPP: 650.0000 mg | Freq: Four times a day (QID) | RECTAL | Status: DC | PRN

## 2016-11-09 MED ORDER — BUDESONIDE 0.25 MG/2ML IN SUSP
0.2500 mg | Freq: Two times a day (BID) | RESPIRATORY_TRACT | Status: DC
  Administered 2016-11-09 – 2016-11-13 (×8): 0.25 mg via RESPIRATORY_TRACT
  Filled 2016-11-09 (×8): qty 2

## 2016-11-09 MED ORDER — ATORVASTATIN CALCIUM 40 MG PO TABS
40.0000 mg | ORAL_TABLET | Freq: Every day | ORAL | Status: DC
  Administered 2016-11-10 – 2016-11-12 (×3): 40 mg via ORAL
  Filled 2016-11-09 (×3): qty 1

## 2016-11-09 MED ORDER — ACETAMINOPHEN 325 MG PO TABS: 650.0000 mg | ORAL_TABLET | Freq: Four times a day (QID) | ORAL | Status: DC | PRN

## 2016-11-09 NOTE — ED Provider Notes (Signed)
[facility]-EMERGENCY DEPT Provider Note   CSN: [id number] Arrival date & time: 11/09/16  4098  By signing my name below, I, Marnette Burgess Long, attest that this documentation has been prepared under the direction and in the presence of Bethann Berkshire, MD. Electronically Signed: Marnette Burgess Long, Scribe. 11/09/2016. 8:13 AM.  History   Chief Complaint Chief Complaint  Patient presents with  . Shortness of Breath   The history is provided by the patient and medical records. No language interpreter was used.  Shortness of Breath  This is a recurrent problem. The problem occurs continuously.The current episode started more than 1 week ago. The problem has been gradually worsening. Associated symptoms include leg swelling. Pertinent negatives include no headaches, no cough, no chest pain, no abdominal pain and no rash. He has tried nothing for the symptoms. The treatment provided no relief. He has had prior hospitalizations. He has had prior ED visits. Associated medical issues include asthma and past MI.    HPI Comments:  [name] is an obese 51 y.o. male with a PMHx of Asthma, MI, CHF, Afib, HTN, who presents to the Emergency Department complaining of gradually worsening, constant SOB onset two weeks ago. Pt reports for the last two weeks, he has experience gradually worsening feelings of SOB. He states this may be stemming from his associated leg swelling and notes has been admitted to the hospital for this swelling, most recently three weeks ago. Pt has an additional associated symptom of unexpected weight gain of 14 lbs during this two week span of time. At home, pt is on 3L of oxygen. No alleviating factors noted or home Tx tried for relief of his symptoms PTA. Pt denies current pain and any other complaints at this time. Cardiologist: Purvis Sheffield    Past Medical History:  Diagnosis Date  . Allergic rhinitis   . Asthma   . Atrial fibrillation (HCC)   . CHF (congestive heart failure) (HCC)     . Essential hypertension   . Gastroesophageal reflux disease   . Heart attack (HCC)   . History of cardiac catheterization    Normal coronaries December 2016  . Noncompliance    Major problem leading to declining health and recurrent hospitalization  . Nonischemic cardiomyopathy (HCC)    LVEF 20-25%  . OSA (obstructive sleep apnea)   . Osteoarthritis   . Peptic ulcer disease     Patient Active Problem List   Diagnosis Date Noted  . Acute on chronic systolic CHF (congestive heart failure) (HCC) 08/19/2016  . CKD (chronic kidney disease), stage II 05/17/2016  . Coronary arteries, normal Dec 2016 01/23/2016  . Chronic anticoagulation-Xarelto 01/23/2016  . NSVT (nonsustained ventricular tachycardia) (HCC) 01/23/2016  . Elevated troponin 12/21/2015  . Nonischemic cardiomyopathy (HCC)   . Morbid obesity due to excess calories (HCC)   . Noncompliance with diet and medication regimen 12/02/2015  . OSA (obstructive sleep apnea) 09/20/2015  . Pulmonary hypertension (HCC) 09/19/2015  . Normocytic anemia 09/19/2015  . Chronic atrial fibrillation (HCC)   . Dyspnea on exertion 05/28/2015  . Acute respiratory failure with hypoxia (HCC) 04/01/2015  . Hypokalemia 04/01/2015  . Obesity hypoventilation syndrome (HCC) 08/08/2014  . GERD (gastroesophageal reflux disease) 08/08/2014  . Edema of right lower extremity 06/21/2014  . Fecal urgency 06/21/2014  . Asthma 02/11/2009  . Hyperlipidemia 07/12/2008  . OBESITY, MORBID 07/12/2008  . Anxiety state 07/12/2008  . DEPRESSION 07/12/2008  . Hypertension 07/12/2008  . ALLERGIC RHINITIS 07/12/2008  . Peptic ulcer 07/12/2008  .  OSTEOARTHRITIS 07/12/2008    Past Surgical History:  Procedure Laterality Date  . CARDIAC CATHETERIZATION N/A 06/14/2015   Procedure: Right/Left Heart Cath and Coronary Angiography;  Surgeon: Runell Gess, MD;  Location: [facility] INVASIVE CV LAB;  Service: Cardiovascular;  Laterality: N/A;    Home Medications     Prior to Admission medications   Medication Sig Start Date End Date Taking? Authorizing Provider  albuterol (PROVENTIL) (2.5 MG/3ML) 0.083% nebulizer solution Take 2.5 mg by nebulization every 6 (six) hours as needed for wheezing or shortness of breath.   Yes [provider]  atorvastatin (LIPITOR) 40 MG tablet Take 1 tablet (40 mg total) by mouth daily at 6 PM. 06/25/16  Yes Kathlen Mody, MD  beclomethasone (QVAR) 80 MCG/ACT inhaler Inhale 2 puffs into the lungs 2 (two) times daily.    Yes [provider]  bumetanide (BUMEX) 2 MG tablet Take 1 tablet (2 mg total) by mouth 2 (two) times daily. 07/25/16  Yes Tat, Onalee Hua, MD  carvedilol (COREG) 25 MG tablet Take 1 tablet (25 mg total) by mouth 2 (two) times daily with a meal. 06/25/16  Yes Kathlen Mody, MD  docusate sodium (COLACE) 100 MG capsule Take 1 capsule (100 mg total) by mouth 2 (two) times daily. Patient taking differently: Take 100 mg by mouth 2 (two) times daily as needed for mild constipation.  06/25/16  Yes Kathlen Mody, MD  ferrous gluconate (FERGON) 324 MG tablet Take 1 tablet (324 mg total) by mouth 2 (two) times daily with a meal. 06/25/16  Yes Kathlen Mody, MD  lisinopril (PRINIVIL,ZESTRIL) 5 MG tablet Take 1 tablet (5 mg total) by mouth daily. 06/25/16  Yes Kathlen Mody, MD  OXYGEN Inhale 3 L into the lungs daily.   Yes [provider]  pantoprazole (PROTONIX) 40 MG tablet Take 1 tablet (40 mg total) by mouth daily at 6 (six) AM. Patient taking differently: Take 40 mg by mouth every evening.  06/26/16  Yes Kathlen Mody, MD  potassium chloride SA (K-DUR,KLOR-CON) 20 MEQ tablet Take 2 tablets (40 mEq total) by mouth daily. 10/27/16  Yes Filbert Schilder, MD  rivaroxaban (XARELTO) 20 MG TABS tablet Take 1 tablet (20 mg total) by mouth daily with breakfast. Patient taking differently: Take 20 mg by mouth daily with supper.  06/26/16  Yes Kathlen Mody, MD  spironolactone (ALDACTONE) 50 MG tablet Take 1 tablet (50  mg total) by mouth daily. 10/02/16  Yes Houston Siren, MD  tamsulosin (FLOMAX) 0.4 MG CAPS capsule Take 1 capsule (0.4 mg total) by mouth daily. 06/25/16  Yes Kathlen Mody, MD  torsemide (DEMADEX) 20 MG tablet Take 20 mg by mouth daily as needed (fluid).    Yes [provider]    Family History Family History  Problem Relation Age of Onset  . Stroke Father   . Heart attack Father   . Aneurysm Mother        Cerebral aneurysm  . Hypertension Sister   . Colon cancer Neg Hx   . Inflammatory bowel disease Neg Hx   . Liver disease Neg Hx     Social History Social History  Substance Use Topics  . Smoking status: Former Smoker    Packs/day: 0.50    Years: 20.00    Types: Cigarettes    Start date: 04/26/1988    Quit date: 06/23/2007  . Smokeless tobacco: Never Used     Comment: 1 ppd former smoker  . Alcohol use No     Comment:  No etoh since 2009     Allergies   Bee venom; Food; Lipitor [atorvastatin]; Aspirin; Banana; Metolazone; Oatmeal; Orange juice [orange oil]; Torsemide; Diltiazem; and Hydralazine   Review of Systems Review of Systems  Constitutional: Positive for unexpected weight change. Negative for appetite change and fatigue.  HENT: Negative for congestion, ear discharge and sinus pressure.   Eyes: Negative for discharge.  Respiratory: Positive for shortness of breath. Negative for cough.   Cardiovascular: Positive for leg swelling. Negative for chest pain.  Gastrointestinal: Negative for abdominal pain and diarrhea.  Genitourinary: Negative for frequency and hematuria.  Musculoskeletal: Negative for back pain.  Skin: Negative for rash.  Neurological: Negative for seizures and headaches.  Psychiatric/Behavioral: Negative for hallucinations.     Physical Exam Updated Vital Signs BP (!) 133/117   Pulse 87   Temp 98.1 F (36.7 C) (Oral)   Resp 20   Ht 6' (1.829 m)   Wt 300 lb (136.1 kg)   SpO2 100%   BMI 40.69 kg/m   Physical Exam  Constitutional: He  is oriented to person, place, and time. He appears well-developed.  HENT:  Head: Normocephalic.  Eyes: Conjunctivae and EOM are normal. No scleral icterus.  Neck: Neck supple. No thyromegaly present.  Cardiovascular: Normal rate and regular rhythm.  Exam reveals no gallop and no friction rub.   No murmur heard. Bilateral 3+ lower extremity edema up to the thighs.  Pulmonary/Chest: No stridor. He has no wheezes. He has no rales. He exhibits no tenderness.  Abdominal: He exhibits no distension. There is no tenderness. There is no rebound.  Musculoskeletal: Normal range of motion. He exhibits no edema.  Lymphadenopathy:    He has no cervical adenopathy.  Neurological: He is oriented to person, place, and time. He exhibits normal muscle tone. Coordination normal.  Skin: No rash noted. No erythema.  Psychiatric: He has a normal mood and affect. His behavior is normal.     ED Treatments / Results  DIAGNOSTIC STUDIES:  Oxygen Saturation is 96% on 2L [place], adeqaute by my interpretation.    COORDINATION OF CARE:  8:11 AM Discussed treatment plan with pt at bedside including blood work with CXR and potential admission for fluid control and pt agreed to plan.  Labs (all labs ordered are listed, but only abnormal results are displayed) Labs Reviewed  BASIC METABOLIC PANEL - Abnormal; Notable for the following:       Result Value   Potassium 3.1 (*)    Calcium 8.6 (*)    All other components within normal limits  CBC - Abnormal; Notable for the following:    RBC 4.04 (*)    Hemoglobin 9.9 (*)    HCT 32.1 (*)    MCH 24.5 (*)    RDW 17.1 (*)    All other components within normal limits  TROPONIN I - Abnormal; Notable for the following:    Troponin I 0.04 (*)    All other components within normal limits  BRAIN NATRIURETIC PEPTIDE - Abnormal; Notable for the following:    B Natriuretic Peptide 770.0 (*)    All other components within normal limits    EKG  EKG  Interpretation  Date/Time:  Monday Nov 09 2016 07:49:48 EDT Ventricular Rate:  103 PR Interval:    QRS Duration: 87 QT Interval:  407 QTC Calculation: 533 R Axis:   67 Text Interpretation:  Atrial fibrillation Borderline repolarization abnormality Prolonged QT interval Confirmed by Myelle Poteat  MD, Lejuan Botto [phone]) on 11/09/2016 9:47:42  AM       Radiology Dg Chest 2 View  Result Date: 11/09/2016 CLINICAL DATA:  Shortness of breath. Bilateral lower extremity swelling. EXAM: CHEST  2 VIEW COMPARISON:  10/24/2016 FINDINGS: There is cardiomegaly with slight pulmonary vascular congestion. No discrete pulmonary edema or effusions. The bones are normal. IMPRESSION: Cardiomegaly with slight pulmonary vascular congestion. Electronically Signed   By: Francene Boyers M.D.   On: 11/09/2016 08:32    Procedures Procedures (including critical care time)  Medications Ordered in ED Medications - No data to display   Initial Impression / Assessment and Plan / ED Course  I have reviewed the triage vital signs and the nursing notes.  Pertinent labs & imaging results that were available during my care of the patient were reviewed by me and considered in my medical decision making (see chart for details).     Patient with congestive heart failure. Patient will  Final Clinical Impressions(s) / ED Diagnoses   Final diagnoses:  None    New Prescriptions New Prescriptions   No medications on file    The chart was scribed for me under my direct supervision.  I personally performed the history, physical, and medical decision making and all procedures in the evaluation of this patient.Bethann Berkshire, MD 11/09/16 1140

## 2016-11-09 NOTE — H&P (Signed)
History and Physical    [name] ZOX:[id number] DOB: 1965/09/12 DOA: 11/09/2016  PCP: Pearson Grippe, MD   Patient coming from: Home  Chief Complaint: Shortness of breath, leg swelling  HPI: [name] is a 51 y.o. male with medical history significant of CHF (numerous admissions for uncompensated CHF but patient continues to be noncompliant with diet and medications outpatient and has been fired from numerous cardiology clinics secondary to no showing for appointments), atrial fibrillation (on xarelto), HTN, MI, asthma,  OSA (no on CPAP or BiPap) presents with progressive leg swelling since discharge from the hospital 2 weeks ago.  Patient reports he has been taking his "water pill" as prescribed but that he feels like it just keeps the water in his legs.  He felt progressively short of breath and has not been able to lay flat in bed (of note patient is laying flat in bed at time of interview).  Patient reports dull chest pain that began as soon as he was discharged and he began taking his home medications.  He says the pain in his chest would resolve with urination.  Occasional dry cough.  No nausea or vomiting, no diarrhea or constipation.    ED Course: Patient was found to have elevated BNP of 770, Troponin 0.04, potassium 3.1.  Patient had significant swelling of lower extremities bilaterally and [facility] was asked to admit for CHF exacerbation.  Review of Systems: As per HPI otherwise 10 point review of systems negative.    Past Medical History:  Diagnosis Date  . Allergic rhinitis   . Asthma   . Atrial fibrillation (HCC)   . CHF (congestive heart failure) (HCC)   . Essential hypertension   . Gastroesophageal reflux disease   . Heart attack (HCC)   . History of cardiac catheterization    Normal coronaries December 2016  . Noncompliance    Major problem leading to declining health and recurrent hospitalization  . Nonischemic cardiomyopathy (HCC)    LVEF 20-25%  . OSA (obstructive  sleep apnea)   . Osteoarthritis   . Peptic ulcer disease     Past Surgical History:  Procedure Laterality Date  . CARDIAC CATHETERIZATION N/A 06/14/2015   Procedure: Right/Left Heart Cath and Coronary Angiography;  Surgeon: Runell Gess, MD;  Location: [facility] INVASIVE CV LAB;  Service: Cardiovascular;  Laterality: N/A;     reports that he quit smoking about 9 years ago. His smoking use included Cigarettes. He started smoking about 28 years ago. He has a 10.00 pack-year smoking history. He has never used smokeless tobacco. He reports that he does not drink alcohol or use drugs.  Allergies  Allergen Reactions  . Bee Venom Shortness Of Breath, Swelling and Other (See Comments)    Reaction:  Facial swelling  . Food Shortness Of Breath, Rash and Other (See Comments)    Pt states that he is allergic to strawberries.    . Lipitor [Atorvastatin] Shortness Of Breath and Other (See Comments)    Reaction:  Nose bleeds   . Aspirin Other (See Comments)    Reaction:  GI upset   . Banana Diarrhea  . Metolazone Other (See Comments)    Pt states that he stopped taking this med due to heart attack like symptoms.   . Oatmeal Nausea And Vomiting  . Orange Juice [Orange Oil] Nausea And Vomiting  . Torsemide Swelling and Other (See Comments)    Reaction:  Swelling of feet/legs   . Diltiazem Palpitations  .  Hydralazine Palpitations    Family History  Problem Relation Age of Onset  . Stroke Father   . Heart attack Father   . Aneurysm Mother        Cerebral aneurysm  . Hypertension Sister   . Colon cancer Neg Hx   . Inflammatory bowel disease Neg Hx   . Liver disease Neg Hx      Prior to Admission medications   Medication Sig Start Date End Date Taking? Authorizing Provider  albuterol (PROVENTIL) (2.5 MG/3ML) 0.083% nebulizer solution Take 2.5 mg by nebulization every 6 (six) hours as needed for wheezing or shortness of breath.   Yes [provider]  atorvastatin (LIPITOR) 40 MG  tablet Take 1 tablet (40 mg total) by mouth daily at 6 PM. 06/25/16  Yes Kathlen Mody, MD  beclomethasone (QVAR) 80 MCG/ACT inhaler Inhale 2 puffs into the lungs 2 (two) times daily.    Yes [provider]  bumetanide (BUMEX) 2 MG tablet Take 1 tablet (2 mg total) by mouth 2 (two) times daily. 07/25/16  Yes Tat, Onalee Hua, MD  carvedilol (COREG) 25 MG tablet Take 1 tablet (25 mg total) by mouth 2 (two) times daily with a meal. 06/25/16  Yes Kathlen Mody, MD  docusate sodium (COLACE) 100 MG capsule Take 1 capsule (100 mg total) by mouth 2 (two) times daily. Patient taking differently: Take 100 mg by mouth 2 (two) times daily as needed for mild constipation.  06/25/16  Yes Kathlen Mody, MD  ferrous gluconate (FERGON) 324 MG tablet Take 1 tablet (324 mg total) by mouth 2 (two) times daily with a meal. 06/25/16  Yes Kathlen Mody, MD  lisinopril (PRINIVIL,ZESTRIL) 5 MG tablet Take 1 tablet (5 mg total) by mouth daily. 06/25/16  Yes Kathlen Mody, MD  OXYGEN Inhale 3 L into the lungs daily.   Yes [provider]  pantoprazole (PROTONIX) 40 MG tablet Take 1 tablet (40 mg total) by mouth daily at 6 (six) AM. Patient taking differently: Take 40 mg by mouth every evening.  06/26/16  Yes Kathlen Mody, MD  potassium chloride SA (K-DUR,KLOR-CON) 20 MEQ tablet Take 2 tablets (40 mEq total) by mouth daily. 10/27/16  Yes Filbert Schilder, MD  rivaroxaban (XARELTO) 20 MG TABS tablet Take 1 tablet (20 mg total) by mouth daily with breakfast. Patient taking differently: Take 20 mg by mouth daily with supper.  06/26/16  Yes Kathlen Mody, MD  spironolactone (ALDACTONE) 50 MG tablet Take 1 tablet (50 mg total) by mouth daily. 10/02/16  Yes Houston Siren, MD  tamsulosin (FLOMAX) 0.4 MG CAPS capsule Take 1 capsule (0.4 mg total) by mouth daily. 06/25/16  Yes Kathlen Mody, MD  torsemide (DEMADEX) 20 MG tablet Take 20 mg by mouth daily as needed (fluid).    Yes [provider]    Physical Exam: Vitals:    11/09/16 1030 11/09/16 1130 11/09/16 1230 11/09/16 1310  BP: (!) 133/117 (!) 149/109 (!) 163/112 (!) 123/95  Pulse: 87 61  (!) 50  Resp: 20 (!) 28 (!) 29 18  Temp:    97.6 F (36.4 C)  TempSrc:    Oral  SpO2: 100% 91%  92%  Weight:    (!) 143.3 kg (316 lb)  Height:    6' (1.829 m)      Constitutional: NAD, calm, comfortable Vitals:   11/09/16 1030 11/09/16 1130 11/09/16 1230 11/09/16 1310  BP: (!) 133/117 (!) 149/109 (!) 163/112 (!) 123/95  Pulse: 87 61  (!) 50  Resp: 20 (!) 28 (!) 29 18  Temp:    97.6 F (36.4 C)  TempSrc:    Oral  SpO2: 100% 91%  92%  Weight:    (!) 143.3 kg (316 lb)  Height:    6' (1.829 m)   Eyes: PERRL, lids and conjunctivae normal, patient wearing dark sunglasses thru the rest of the exam ENMT: Mucous membranes are moist. Posterior pharynx clear of any exudate or lesions.Normal dentition.  Neck: normal, supple, no masses, no thyromegaly Respiratory: few rales bilateral bases, end expiratory wheezing.  Cardiovascular: irregularly irregular, no murmurs / rubs / gallops. 3+ edema bilateral lower extremities. 2+ pedal pulses. No carotid bruits.  Abdomen: obese, no tenderness, no masses palpated. No hepatosplenomegaly. Bowel sounds positive.  Musculoskeletal: no clubbing / cyanosis. No joint deformity upper and lower extremities. Good ROM, no contractures. Normal muscle tone.  Skin: no rashes, lesions, ulcers. Stasis changes of the lower extremities bilaterally Neurologic: CN 2-12 grossly intact. Sensation intact, DTR normal. Strength 5/5 in all 4.  Psychiatric: Alert and oriented x 3. Normal mood.     Labs on Admission: I have personally reviewed following labs and imaging studies  CBC:  Recent Labs Lab 11/09/16 0800  WBC 4.3  HGB 9.9*  HCT 32.1*  MCV 79.5  PLT 230   Basic Metabolic Panel:  Recent Labs Lab 11/09/16 0800  NA 139  K 3.1*  CL 105  CO2 27  GLUCOSE 93  BUN 20  CREATININE 1.13  CALCIUM 8.6*   GFR: Estimated Creatinine  Clearance: 117.5 mL/min (by C-G formula based on SCr of 1.13 mg/dL). Liver Function Tests: No results for input(s): AST, ALT, ALKPHOS, BILITOT, PROT, ALBUMIN in the last 168 hours. No results for input(s): LIPASE, AMYLASE in the last 168 hours. No results for input(s): AMMONIA in the last 168 hours. Coagulation Profile: No results for input(s): INR, PROTIME in the last 168 hours. Cardiac Enzymes:  Recent Labs Lab 11/09/16 0800  TROPONINI 0.04*   BNP (last 3 results) No results for input(s): PROBNP in the last 8760 hours. HbA1C: No results for input(s): HGBA1C in the last 72 hours. CBG: No results for input(s): GLUCAP in the last 168 hours. Lipid Profile: No results for input(s): CHOL, HDL, LDLCALC, TRIG, CHOLHDL, LDLDIRECT in the last 72 hours. Thyroid Function Tests: No results for input(s): TSH, T4TOTAL, FREET4, T3FREE, THYROIDAB in the last 72 hours. Anemia Panel: No results for input(s): VITAMINB12, FOLATE, FERRITIN, TIBC, IRON, RETICCTPCT in the last 72 hours. Urine analysis:    Component Value Date/Time   COLORURINE AMBER (A) 10/24/2016 0923   APPEARANCEUR CLEAR 10/24/2016 0923   LABSPEC 1.027 10/24/2016 0923   PHURINE 5.0 10/24/2016 0923   GLUCOSEU NEGATIVE 10/24/2016 0923   GLUCOSEU NEG mg/dL 16/03/9603 5409   HGBUR NEGATIVE 10/24/2016 0923   BILIRUBINUR NEGATIVE 10/24/2016 0923   KETONESUR NEGATIVE 10/24/2016 0923   PROTEINUR 100 (A) 10/24/2016 0923   UROBILINOGEN 0.2 08/08/2014 1445   NITRITE NEGATIVE 10/24/2016 0923   LEUKOCYTESUR NEGATIVE 10/24/2016 0923   Sepsis Labs: !!!!!!!!!!!!!!!!!!!!!!!!!!!!!!!!!!!!!!!!!!!! @LABRCNTIP (procalcitonin:4,lacticidven:4) )No results found for this or any previous visit (from the past 240 hour(s)).   Radiological Exams on Admission: Dg Chest 2 View  Result Date: 11/09/2016 CLINICAL DATA:  Shortness of breath. Bilateral lower extremity swelling. EXAM: CHEST  2 VIEW COMPARISON:  10/24/2016 FINDINGS: There is cardiomegaly  with slight pulmonary vascular congestion. No discrete pulmonary edema or effusions. The bones are normal. IMPRESSION: Cardiomegaly with slight pulmonary vascular congestion. Electronically Signed  By: Francene Boyers M.D.   On: 11/09/2016 08:32    EKG: Independently reviewed. Atrial fibrillation, QT prolongation  Assessment/Plan Principal Problem:   Acute CHF (congestive heart failure) (HCC) Active Problems:   Hyperlipidemia   Hypertension   GERD (gastroesophageal reflux disease)   Noncompliance with diet and medication regimen   Elevated troponin   CKD (chronic kidney disease), stage II     Acute CHF - CHF order set - since discharge patient has had increase in weight of 18 pounds - restart IV lasix 60mg  IV BID - continue aldactone - will get repeat Echo - fluid restriction - monitor daily BMP  Chronic atrial fibrillation - Continue xarelto - hold coreg if in acute decompensation  HTN - continue lisinopril - PRN NTG - will restart coreg when not in acute decompensated heart failure  GERD - continue protoninx  Elevated Troponin - cycle troponins - no chest pain - continue statin - restart beta blocker when appropriate   DVT prophylaxis:xarelto Code Status:  Full code  Family Communication: no family bedside Disposition Plan: Likely discharge home after diuresis Consults called: None Admission status: Inpatient, telemetry    Katrinka Blazing MD [facility] Pager [phone]  If 7PM-7AM, please contact night-coverage [email or web address] Password [email or web address]  11/09/2016, 2:03 PM

## 2016-11-09 NOTE — ED Notes (Signed)
CRITICAL VALUE ALERT  Critical value received:  Troponin  0.04   Date of notification:  11/09/2016  Time of notification:  0847  Critical value read back:yes  Nurse who received alert:  Gaetano Hawthorne  MD notified (1st page):  Dr. Estell Harpin

## 2016-11-09 NOTE — ED Triage Notes (Signed)
C/o sob and feeling like he is retaining fluid.  B/p 190/93 with EMS.  Sat 98% room air.

## 2016-11-10 ENCOUNTER — Inpatient Hospital Stay (HOSPITAL_COMMUNITY): Payer: Medicare Other

## 2016-11-10 DIAGNOSIS — I361 Nonrheumatic tricuspid (valve) insufficiency: Secondary | ICD-10-CM

## 2016-11-10 LAB — TROPONIN I

## 2016-11-10 LAB — BASIC METABOLIC PANEL
Anion gap: 6 (ref 5–15)
BUN: 18 mg/dL (ref 6–20)
CHLORIDE: 101 mmol/L (ref 101–111)
CO2: 29 mmol/L (ref 22–32)
Calcium: 8.5 mg/dL — ABNORMAL LOW (ref 8.9–10.3)
Creatinine, Ser: 1.26 mg/dL — ABNORMAL HIGH (ref 0.61–1.24)
GFR calc Af Amer: >60 mL/min (ref >60)
GFR calc non Af Amer: >60 mL/min (ref >60)
GLUCOSE: 104 mg/dL — AB (ref 65–99)
POTASSIUM: 3.3 mmol/L — AB (ref 3.5–5.1)
Sodium: 136 mmol/L (ref 135–145)

## 2016-11-10 LAB — ECHOCARDIOGRAM COMPLETE
Height: 72 in
Weight: 4896 oz

## 2016-11-10 MED ORDER — POTASSIUM CHLORIDE CRYS ER 20 MEQ PO TBCR
40.0000 meq | EXTENDED_RELEASE_TABLET | Freq: Two times a day (BID) | ORAL | Status: DC
  Administered 2016-11-10 – 2016-11-13 (×6): 40 meq via ORAL
  Filled 2016-11-10 (×8): qty 2

## 2016-11-10 MED ORDER — FUROSEMIDE 10 MG/ML IJ SOLN
40.0000 mg | Freq: Two times a day (BID) | INTRAMUSCULAR | Status: AC
Start: 2016-11-10 — End: 2016-11-12
  Administered 2016-11-10 – 2016-11-12 (×6): 40 mg via INTRAVENOUS
  Filled 2016-11-10 (×6): qty 4

## 2016-11-10 MED ORDER — CARVEDILOL 12.5 MG PO TABS
25.0000 mg | ORAL_TABLET | Freq: Two times a day (BID) | ORAL | Status: DC
  Administered 2016-11-10 – 2016-11-13 (×6): 25 mg via ORAL
  Filled 2016-11-10 (×6): qty 2

## 2016-11-10 MED ORDER — PERFLUTREN LIPID MICROSPHERE
1.0000 mL | INTRAVENOUS | Status: AC | PRN
  Administered 2016-11-10: 2 mL via INTRAVENOUS
  Filled 2016-11-10: qty 10

## 2016-11-10 NOTE — Progress Notes (Signed)
PROGRESS NOTE    [name]  MLJ:[id number] DOB: 30-Apr-1966 DOA: 11/09/2016 PCP: Pearson Grippe, MD    Brief Narrative:  [name] is a 51 y.o. male with medical history significant of CHF (numerous admissions for uncompensated CHF but patient continues to be noncompliant with diet and medications outpatient and has been fired from numerous cardiology clinics secondary to no showing for appointments), atrial fibrillation (on xarelto), HTN, MI, asthma,  OSA (no on CPAP or BiPap) presents with progressive leg swelling since discharge from the hospital 2 weeks ago.  Patient reports he has been taking his "water pill" as prescribed but that he feels like it just keeps the water in his legs.  He felt progressively short of breath and has not been able to lay flat in bed (of note patient is laying flat in bed at time of interview).  Patient reports dull chest pain that began as soon as he was discharged and he began taking his home medications.  He says the pain in his chest would resolve with urination.  Occasional dry cough.  No nausea or vomiting, no diarrhea or constipation.     Assessment & Plan:   Principal Problem:   Acute CHF (congestive heart failure) (HCC) Active Problems:   Hyperlipidemia   Hypertension   GERD (gastroesophageal reflux disease)   Noncompliance with diet and medication regimen   Elevated troponin   CKD (chronic kidney disease), stage II   Acute CHF - weight from 143.33>138.8 - decrease lasix to 40mg  IV BID  - continue aldactone - repeat Echo essentially unchanged - fluid restriction - monitor daily BMP (creatinine slightly increased so decreased lasix)  Chronic atrial fibrillation - Continue xarelto - restart coreg  HTN - continue lisinopril - PRN NTG - restart coreg  GERD - continue protoninx  Elevated Troponin - troponins plateaued   DVT prophylaxis:xarelto Code Status:  Full code  Family Communication: no family  bedside Disposition Plan: Likely discharge home after diuresis   Consultants:   None  Procedures:   Echocardiogram  Antimicrobials:   None    Subjective: Patient very upset this morning that he is doing everything he is supposed to do outpatient and still struggles to keep his swelling down.    Objective: Vitals:   11/09/16 2015 11/09/16 2101 11/10/16 0544 11/10/16 0820  BP:  122/84 (!) 133/95   Pulse: (!) 103 (!) 101 (!) 113   Resp: 18 18 18    Temp:  97.9 F (36.6 C) 98 F (36.7 C)   TempSrc:  Oral Oral   SpO2: 93% 94% 95% 94%  Weight:   (!) 138.8 kg (306 lb)   Height:        Intake/Output Summary (Last 24 hours) at 11/10/16 1650 Last data filed at 11/10/16 1600  Gross per 24 hour  Intake             1320 ml  Output             3950 ml  Net            -2630 ml   Filed Weights   11/09/16 0744 11/09/16 1310 11/10/16 0544  Weight: 136.1 kg (300 lb) (!) 143.3 kg (316 lb) (!) 138.8 kg (306 lb)    Examination:  General exam: Appears calm and comfortable  Respiratory system: few bibasilar rales Cardiovascular system: S1 & S2 heard, irregularly irregular. No JVD, murmurs, rubs, gallops or clicks. 2+ edema to mid shin bilaterally. Gastrointestinal system: Abdomen  is obese, nondistended, soft and nontender. No organomegaly or masses felt. Normal bowel sounds heard. Central nervous system: Alert and oriented. No focal neurological deficits. Extremities: Symmetric 5 x 5 power.  Skin: hyperkeratosis of lower extremities bilaterally Psychiatry: Poor insight into medical problems, Mood & affect appropriate.     Data Reviewed: I have personally reviewed following labs and imaging studies  CBC:  Recent Labs Lab 11/09/16 0800  WBC 4.3  HGB 9.9*  HCT 32.1*  MCV 79.5  PLT 230   Basic Metabolic Panel:  Recent Labs Lab 11/09/16 0800 11/10/16 0252  NA 139 136  K 3.1* 3.3*  CL 105 101  CO2 27 29  GLUCOSE 93 104*  BUN 20 18  CREATININE 1.13 1.26*  CALCIUM  8.6* 8.5*   GFR: Estimated Creatinine Clearance: 103.5 mL/min (A) (by C-G formula based on SCr of 1.26 mg/dL (H)). Liver Function Tests: No results for input(s): AST, ALT, ALKPHOS, BILITOT, PROT, ALBUMIN in the last 168 hours. No results for input(s): LIPASE, AMYLASE in the last 168 hours. No results for input(s): AMMONIA in the last 168 hours. Coagulation Profile: No results for input(s): INR, PROTIME in the last 168 hours. Cardiac Enzymes:  Recent Labs Lab 11/09/16 0800 11/09/16 1533 11/09/16 2057 11/10/16 0252  TROPONINI 0.04* 0.03* 0.03* <0.03   BNP (last 3 results) No results for input(s): PROBNP in the last 8760 hours. HbA1C: No results for input(s): HGBA1C in the last 72 hours. CBG: No results for input(s): GLUCAP in the last 168 hours. Lipid Profile: No results for input(s): CHOL, HDL, LDLCALC, TRIG, CHOLHDL, LDLDIRECT in the last 72 hours. Thyroid Function Tests: No results for input(s): TSH, T4TOTAL, FREET4, T3FREE, THYROIDAB in the last 72 hours. Anemia Panel: No results for input(s): VITAMINB12, FOLATE, FERRITIN, TIBC, IRON, RETICCTPCT in the last 72 hours. Sepsis Labs: No results for input(s): PROCALCITON, LATICACIDVEN in the last 168 hours.  No results found for this or any previous visit (from the past 240 hour(s)).       Radiology Studies: Dg Chest 2 View  Result Date: 11/09/2016 CLINICAL DATA:  Shortness of breath. Bilateral lower extremity swelling. EXAM: CHEST  2 VIEW COMPARISON:  10/24/2016 FINDINGS: There is cardiomegaly with slight pulmonary vascular congestion. No discrete pulmonary edema or effusions. The bones are normal. IMPRESSION: Cardiomegaly with slight pulmonary vascular congestion. Electronically Signed   By: Francene Boyers M.D.   On: 11/09/2016 08:32        Scheduled Meds: . atorvastatin  40 mg Oral q1800  . budesonide (PULMICORT) nebulizer solution  0.25 mg Nebulization BID  . furosemide  40 mg Intravenous BID  . lisinopril  5  mg Oral Daily  . rivaroxaban  20 mg Oral Q supper  . spironolactone  50 mg Oral Daily  . tamsulosin  0.4 mg Oral Daily   Continuous Infusions:   LOS: 1 day    Time spent: 35 minutes    Katrinka Blazing, MD [facility] Pager [phone]  If 7PM-7AM, please contact night-coverage [email or web address] Password [facility] 11/10/2016, 4:50 PM

## 2016-11-10 NOTE — Care Management Note (Signed)
Case Management Note  Patient Details  Name: [name] MRN: [id number] Date of Birth: 06/30/1965  Subjective/Objective:                  Pt admitted with CHF. Pt well known to CM team. He is from home, lives with his mother, Pt is ind with ALD's. He has home oxygen. He is very non-compliant. He has been discharged from all community support programs, including multiple HH agencies, and [facility] para-medicine programs due to his non-compliance.   Action/Plan: Pt will DC back home with self care.   Expected Discharge Date:     11/13/2016             Expected Discharge Plan:  Home/Self Care  In-House Referral:  NA  Discharge planning Services  CM Consult  Post Acute Care Choice:  NA Choice offered to:  NA  Status of Service:  Completed, signed off  [name], RN 11/10/2016, 11:03 AM

## 2016-11-10 NOTE — Progress Notes (Signed)
*  PRELIMINARY RESULTS* Echocardiogram 2D Echocardiogram with definity has been performed.  [name] 11/10/2016, 9:57 AM

## 2016-11-11 DIAGNOSIS — R7989 Other specified abnormal findings of blood chemistry: Secondary | ICD-10-CM

## 2016-11-11 DIAGNOSIS — Z9114 Patient's other noncompliance with medication regimen: Secondary | ICD-10-CM

## 2016-11-11 DIAGNOSIS — I428 Other cardiomyopathies: Secondary | ICD-10-CM

## 2016-11-11 DIAGNOSIS — E785 Hyperlipidemia, unspecified: Secondary | ICD-10-CM

## 2016-11-11 DIAGNOSIS — N182 Chronic kidney disease, stage 2 (mild): Secondary | ICD-10-CM

## 2016-11-11 DIAGNOSIS — E784 Other hyperlipidemia: Secondary | ICD-10-CM

## 2016-11-11 DIAGNOSIS — I10 Essential (primary) hypertension: Secondary | ICD-10-CM

## 2016-11-11 DIAGNOSIS — I5023 Acute on chronic systolic (congestive) heart failure: Secondary | ICD-10-CM

## 2016-11-11 DIAGNOSIS — Z9111 Patient's noncompliance with dietary regimen: Secondary | ICD-10-CM

## 2016-11-11 DIAGNOSIS — I482 Chronic atrial fibrillation: Secondary | ICD-10-CM

## 2016-11-11 DIAGNOSIS — Z9119 Patient's noncompliance with other medical treatment and regimen: Secondary | ICD-10-CM

## 2016-11-11 DIAGNOSIS — R748 Abnormal levels of other serum enzymes: Secondary | ICD-10-CM

## 2016-11-11 DIAGNOSIS — E662 Morbid (severe) obesity with alveolar hypoventilation: Secondary | ICD-10-CM

## 2016-11-11 LAB — BASIC METABOLIC PANEL
Anion gap: 9 (ref 5–15)
BUN: 22 mg/dL — AB (ref 6–20)
CHLORIDE: 106 mmol/L (ref 101–111)
CO2: 26 mmol/L (ref 22–32)
Calcium: 8.5 mg/dL — ABNORMAL LOW (ref 8.9–10.3)
Creatinine, Ser: 1.24 mg/dL (ref 0.61–1.24)
GFR calc Af Amer: >60 mL/min (ref >60)
GFR calc non Af Amer: >60 mL/min (ref >60)
Glucose, Bld: 91 mg/dL (ref 65–99)
POTASSIUM: 3.8 mmol/L (ref 3.5–5.1)
SODIUM: 141 mmol/L (ref 135–145)

## 2016-11-11 NOTE — Progress Notes (Signed)
PROGRESS NOTE  [name]:[id number] DOB: [birth year] DOA: 11/09/2016 PCP: Pearson Grippe, MD  Brief History:  51 y.o.malewith medical history significant of CHF (numerous admissions for uncompensated CHF but patient continues to be noncompliant with diet and medications outpatient and has been fired from numerous cardiology clinics secondary to no showing for appointments), atrial fibrillation (on xarelto), HTN, MI, asthma, OSA (no on CPAP or BiPap) presents with progressive leg swelling since discharge from the hospital 2 weeks ago. Patient reports he has been taking his "water pill" as prescribed but that he feels like it just keeps the water in his legs. He felt progressively short of breath and has not been able to lay flat in bed (of note patient is laying flat in bed at time of interview). Patient reports dull chest pain that began as soon as he was discharged and he began taking his home medications. He says the pain in his chest would resolve with urination. Occasional dry cough. No nausea or vomiting, no diarrhea or constipation.   Assessment/Plan: Acute on chronic systolic CHF - dry weight around 290 -admission weight 306 -NEG 3 lbs for the admission -NEG 5 L but question accuracy - continue lasix to 40mg  IV BID  - continue aldactone - repeat Echo--GAF 35-40 percent, diffuse HK, PSP 56, mild MR, +PFO - fluid restriction - monitor daily BMP   Chronic atrial fibrillation - Continue xarelto - continue coreg -rate controlled  HTN - continue lisinopril - PRN NTG - restart coreg  GERD - continue protoninx  Elevated Troponin -secondary to CHF - troponins plateaued/flat  Hyperlipidemia -continue statin     Disposition Plan:   Home in 1-2  days  Family Communication:  No Family at bedside  Consultants:  none  Code Status:  FULL   DVT Prophylaxis: Xarelto   Procedures: As Listed in Progress Note  Above  Antibiotics: None    Subjective: Patient denies fevers, chills, headache, chest pain, dyspnea, nausea, vomiting, diarrhea, abdominal pain, dysuria, hematuria, hematochezia, and melena.   Objective: Vitals:   11/10/16 2100 11/11/16 0633 11/11/16 0835 11/11/16 1425  BP: (!) 113/99 (!) 144/100    Pulse: 79 80  88  Resp: 18 (!) 22  20  Temp: 98.3 F (36.8 C) 97.9 F (36.6 C)  98.1 F (36.7 C)  TempSrc: Oral Oral    SpO2: 98% 100% 93% 96%  Weight:  (!) 137.7 kg (303 lb 8 oz)    Height:        Intake/Output Summary (Last 24 hours) at 11/11/16 1833 Last data filed at 11/11/16 9562  Gross per 24 hour  Intake                0 ml  Output             1600 ml  Net            -1600 ml   Weight change: 1.588 kg (3 lb 8 oz) Exam:   General:  Pt is alert, follows commands appropriately, not in acute distress  HEENT: No icterus, No thrush, No neck mass, [place]/AT  Cardiovascular: RRR, S1/S2, no rubs, no gallops  Respiratory: CTA bilaterally, no wheezing, no crackles, no rhonchi  Abdomen: Soft/+BS, non tender, non distended, no guarding  Extremities: No edema, No lymphangitis, No petechiae, No rashes, no synovitis   Data Reviewed: I have personally reviewed following labs and imaging studies Basic Metabolic Panel:  Recent  Labs Lab 11/09/16 0800 11/10/16 0252 11/11/16 0515  NA 139 136 141  K 3.1* 3.3* 3.8  CL 105 101 106  CO2 27 29 26   GLUCOSE 93 104* 91  BUN 20 18 22*  CREATININE 1.13 1.26* 1.24  CALCIUM 8.6* 8.5* 8.5*   Liver Function Tests: No results for input(s): AST, ALT, ALKPHOS, BILITOT, PROT, ALBUMIN in the last 168 hours. No results for input(s): LIPASE, AMYLASE in the last 168 hours. No results for input(s): AMMONIA in the last 168 hours. Coagulation Profile: No results for input(s): INR, PROTIME in the last 168 hours. CBC:  Recent Labs Lab 11/09/16 0800  WBC 4.3  HGB 9.9*  HCT 32.1*  MCV 79.5  PLT 230   Cardiac Enzymes:  Recent  Labs Lab 11/09/16 0800 11/09/16 1533 11/09/16 2057 11/10/16 0252  TROPONINI 0.04* 0.03* 0.03* <0.03   BNP: Invalid input(s): POCBNP CBG: No results for input(s): GLUCAP in the last 168 hours. HbA1C: No results for input(s): HGBA1C in the last 72 hours. Urine analysis:    Component Value Date/Time   COLORURINE AMBER (A) 10/24/2016 0923   APPEARANCEUR CLEAR 10/24/2016 0923   LABSPEC 1.027 10/24/2016 0923   PHURINE 5.0 10/24/2016 0923   GLUCOSEU NEGATIVE 10/24/2016 0923   GLUCOSEU NEG mg/dL 93/90/3009 2330   HGBUR NEGATIVE 10/24/2016 0923   BILIRUBINUR NEGATIVE 10/24/2016 0923   KETONESUR NEGATIVE 10/24/2016 0923   PROTEINUR 100 (A) 10/24/2016 0923   UROBILINOGEN 0.2 08/08/2014 1445   NITRITE NEGATIVE 10/24/2016 0923   LEUKOCYTESUR NEGATIVE 10/24/2016 0923   Sepsis Labs: @LABRCNTIP (procalcitonin:4,lacticidven:4) )No results found for this or any previous visit (from the past 240 hour(s)).   Scheduled Meds: . atorvastatin  40 mg Oral q1800  . budesonide (PULMICORT) nebulizer solution  0.25 mg Nebulization BID  . carvedilol  25 mg Oral BID WC  . furosemide  40 mg Intravenous BID  . lisinopril  5 mg Oral Daily  . potassium chloride  40 mEq Oral BID  . rivaroxaban  20 mg Oral Q supper  . spironolactone  50 mg Oral Daily  . tamsulosin  0.4 mg Oral Daily   Continuous Infusions:  Procedures/Studies: Dg Chest 2 View  Result Date: 11/09/2016 CLINICAL DATA:  Shortness of breath. Bilateral lower extremity swelling. EXAM: CHEST  2 VIEW COMPARISON:  10/24/2016 FINDINGS: There is cardiomegaly with slight pulmonary vascular congestion. No discrete pulmonary edema or effusions. The bones are normal. IMPRESSION: Cardiomegaly with slight pulmonary vascular congestion. Electronically Signed   By: Francene Boyers M.D.   On: 11/09/2016 08:32   Dg Chest 2 View  Result Date: 10/24/2016 CLINICAL DATA:  Dyspnea EXAM: CHEST  2 VIEW COMPARISON:  10/16/2016 chest radiograph. FINDINGS: Stable  cardiomediastinal silhouette with mild cardiomegaly. No pneumothorax. No pleural effusion. Mild to moderate pulmonary edema. IMPRESSION: Mild-to-moderate congestive heart failure. Electronically Signed   By: Delbert Phenix M.D.   On: 10/24/2016 09:29    Satchel Heidinger, DO  [facility] Pager [phone]  If 7PM-7AM, please contact night-coverage [email or web address] Password [facility] 11/11/2016, 6:33 PM   LOS: 2 days

## 2016-11-11 NOTE — Plan of Care (Signed)
Problem: Food- and Nutrition-Related Knowledge Deficit (NB-1.1) Goal: Nutrition education Formal process to instruct or train a patient/client in a skill or to impart knowledge to help patients/clients voluntarily manage or modify food choices and eating behavior to maintain or improve health. Outcome: Adequate for Discharge Nutrition Education Note  RD consulted for nutrition education regarding  CHF. Patient has repeatedly required hospitalization for same problem and is chronically non-compliant. Today he "blames" his medication regimen and says NO-ONE can get rid all sodium. Emphasized he is not being asked to totally eliminate salt but to make diet changes that will decrease his daily intake to < 2000 mg daily or limiting foods that have more than 300 mg per serving.    RD provided "Heart Failure Nutrition Therapy and Fluid Restricted Diet" handouts from the [employer]. Reviewed patient's dietary recall. Provided examples on ways to decrease sodium intake in diet. Discouraged intake of processed foods and use of salt shaker. Encouraged fresh fruits and vegetables as well as whole grain sources of carbohydrates to maximize fiber intake.   RD discussed why it is important for patient to adhere to diet recommendations, and emphasized the role of fluids, foods to avoid, and importance of weighing self daily. Teach back method used.  Expect fair compliance. Currently pt seems to have a "defeated" attitude toward making long term diet changes. He needs a strong support system to help him get started and become comfortable with at least the basics.   Body mass index is 41.16 kg/m. Pt meets criteria for morbid obesity based on current BMI.  Current diet order is Heart healthy, patient is consuming approximately 100% of most meals.. Labs and medications reviewed.   Recommend: Patient is going to need follow up from outpatient RD and / or ongoing nursing reinforcement while he  is hospitalized regarding basic principles: low sodium diet and limiting his daily fluid intake. His primary barrier is not being able to see himself following a Heart Healthy diet.   Royann Shivers MS,RD,CSG,LDN Office: [phone] Pager: [phone]

## 2016-11-12 ENCOUNTER — Inpatient Hospital Stay (HOSPITAL_COMMUNITY): Payer: Medicare Other

## 2016-11-12 LAB — BASIC METABOLIC PANEL
ANION GAP: 7 (ref 5–15)
BUN: 26 mg/dL — ABNORMAL HIGH (ref 6–20)
CHLORIDE: 104 mmol/L (ref 101–111)
CO2: 27 mmol/L (ref 22–32)
CREATININE: 1.38 mg/dL — AB (ref 0.61–1.24)
Calcium: 8.6 mg/dL — ABNORMAL LOW (ref 8.9–10.3)
GFR calc non Af Amer: 59 mL/min — ABNORMAL LOW (ref >60)
Glucose, Bld: 87 mg/dL (ref 65–99)
Potassium: 4 mmol/L (ref 3.5–5.1)
Sodium: 138 mmol/L (ref 135–145)

## 2016-11-12 LAB — MAGNESIUM: MAGNESIUM: 2 mg/dL (ref 1.7–2.4)

## 2016-11-12 MED ORDER — BUMETANIDE 1 MG PO TABS
2.0000 mg | ORAL_TABLET | Freq: Two times a day (BID) | ORAL | Status: DC
  Administered 2016-11-13: 2 mg via ORAL
  Filled 2016-11-12: qty 2

## 2016-11-12 NOTE — Discharge Summary (Signed)
Physician Discharge Summary  [name] ZOX:[id number] DOB: April 28, 1966 DOA: 11/09/2016  PCP: Pearson Grippe, MD  Admit date: 11/09/2016 Discharge date: 11/13/2016  Admitted From: Home Disposition:  Home  Recommendations for Outpatient Follow-up:  1. Follow up with PCP in 1-2 weeks 2. Please obtain BMP/CBC in one week   Home Health: No Equipment/Devices:N/A  Discharge Condition: Stable CODE STATUS: FULL Diet recommendation: Heart Healthy   Brief/Interim Summary: 51 y.o.malewith medical history significant of CHF (numerous admissions for uncompensated CHF but patient continues to be noncompliant with diet and medications outpatient and has been fired from numerous cardiology clinics secondary to no showing for appointments), atrial fibrillation (on xarelto), HTN, MI, asthma, OSA (no on CPAP or BiPap) presents with progressive leg swelling since discharge from the hospital 2 weeks ago. Patient reports he has been taking his "water pill" as prescribed but that he feels like it just keeps the water in his legs. He felt progressively short of breath and has not been able to lay flat in bed (of note patient is laying flat in bed at time of interview). Patient reports dull chest pain that began as soon as he was discharged and he began taking his home medications. He says the pain in his chest would resolve with urination. Occasional dry cough. No nausea or vomiting, no diarrhea or constipation.   Discharge Diagnoses:   Acute on chronic systolic CHF - previous dry weight around 294-297 -admission weight 306 -discharge weight 299 -NEG 7 lbs for the admission -NEG 8.6 L but question accuracy - continuelasix to 40mg  IV BID-->bumex 2 mg bid po - continue aldactone - repeat Echo--EF 35-40 percent, diffuse HK, PSP 56, mild MR, +PFO - fluid restriction - monitor daily BMP  -prior to discharge pt ultimately endorsed that he had run out of his bumex and coreg and endorsed dietary  indiscretion -5/25--clinically euvolemic, no JVD  Chronic atrial fibrillation - Continue xarelto - continuecoreg -rate controlled  HTN - continue lisinopril - PRN NTG - restarted coreg  GERD - continue protoninx  Elevated Troponin -secondary to CHF - troponins plateaued/flat  Hyperlipidemia -continue statin  Right>Left leg edema -Right venous duplex--negative for DVT  Discharge Instructions  Discharge Instructions    Diet - low sodium heart healthy    Complete by:  As directed    Increase activity slowly    Complete by:  As directed      Allergies as of 11/13/2016      Reactions   Bee Venom Shortness Of Breath, Swelling, Other (See Comments)   Reaction:  Facial swelling   Food Shortness Of Breath, Rash, Other (See Comments)   Pt states that he is allergic to strawberries.     Lipitor [atorvastatin] Shortness Of Breath, Other (See Comments)   Reaction:  Nose bleeds    Aspirin Other (See Comments)   Reaction:  GI upset    Banana Diarrhea   Metolazone Other (See Comments)   Pt states that he stopped taking this med due to heart attack like symptoms.    Oatmeal Nausea And Vomiting   Orange Juice [orange Oil] Nausea And Vomiting   Torsemide Swelling, Other (See Comments)   Reaction:  Swelling of feet/legs    Diltiazem Palpitations   Hydralazine Palpitations      Medication List    STOP taking these medications   OXYGEN   torsemide 20 MG tablet Commonly known as:  DEMADEX     TAKE these medications   albuterol (2.5 MG/3ML) 0.083%  nebulizer solution Commonly known as:  PROVENTIL Take 2.5 mg by nebulization every 6 (six) hours as needed for wheezing or shortness of breath.   atorvastatin 40 MG tablet Commonly known as:  LIPITOR Take 1 tablet (40 mg total) by mouth daily at 6 PM.   beclomethasone 80 MCG/ACT inhaler Commonly known as:  QVAR Inhale 2 puffs into the lungs 2 (two) times daily.   bumetanide 2 MG tablet Commonly known as:   BUMEX Take 1 tablet (2 mg total) by mouth 2 (two) times daily.   carvedilol 25 MG tablet Commonly known as:  COREG Take 1 tablet (25 mg total) by mouth 2 (two) times daily with a meal.   docusate sodium 100 MG capsule Commonly known as:  COLACE Take 1 capsule (100 mg total) by mouth 2 (two) times daily. What changed:  when to take this  reasons to take this   ferrous gluconate 324 MG tablet Commonly known as:  FERGON Take 1 tablet (324 mg total) by mouth 2 (two) times daily with a meal.   lisinopril 5 MG tablet Commonly known as:  PRINIVIL,ZESTRIL Take 1 tablet (5 mg total) by mouth daily.   pantoprazole 40 MG tablet Commonly known as:  PROTONIX Take 1 tablet (40 mg total) by mouth daily at 6 (six) AM. What changed:  when to take this   potassium chloride SA 20 MEQ tablet Commonly known as:  K-DUR,KLOR-CON Take 2 tablets (40 mEq total) by mouth daily.   rivaroxaban 20 MG Tabs tablet Commonly known as:  XARELTO Take 1 tablet (20 mg total) by mouth daily with breakfast. What changed:  when to take this   spironolactone 50 MG tablet Commonly known as:  ALDACTONE Take 1 tablet (50 mg total) by mouth daily.   tamsulosin 0.4 MG Caps capsule Commonly known as:  FLOMAX Take 1 capsule (0.4 mg total) by mouth daily.       Allergies  Allergen Reactions  . Bee Venom Shortness Of Breath, Swelling and Other (See Comments)    Reaction:  Facial swelling  . Food Shortness Of Breath, Rash and Other (See Comments)    Pt states that he is allergic to strawberries.    . Lipitor [Atorvastatin] Shortness Of Breath and Other (See Comments)    Reaction:  Nose bleeds   . Aspirin Other (See Comments)    Reaction:  GI upset   . Banana Diarrhea  . Metolazone Other (See Comments)    Pt states that he stopped taking this med due to heart attack like symptoms.   . Oatmeal Nausea And Vomiting  . Orange Juice [Orange Oil] Nausea And Vomiting  . Torsemide Swelling and Other (See  Comments)    Reaction:  Swelling of feet/legs   . Diltiazem Palpitations  . Hydralazine Palpitations    Consultations:  none   Procedures/Studies: Dg Chest 2 View  Result Date: 11/09/2016 CLINICAL DATA:  Shortness of breath. Bilateral lower extremity swelling. EXAM: CHEST  2 VIEW COMPARISON:  10/24/2016 FINDINGS: There is cardiomegaly with slight pulmonary vascular congestion. No discrete pulmonary edema or effusions. The bones are normal. IMPRESSION: Cardiomegaly with slight pulmonary vascular congestion. Electronically Signed   By: Francene Boyers M.D.   On: 11/09/2016 08:32   Dg Chest 2 View  Result Date: 10/24/2016 CLINICAL DATA:  Dyspnea EXAM: CHEST  2 VIEW COMPARISON:  10/16/2016 chest radiograph. FINDINGS: Stable cardiomediastinal silhouette with mild cardiomegaly. No pneumothorax. No pleural effusion. Mild to moderate pulmonary edema. IMPRESSION: Mild-to-moderate congestive heart  failure. Electronically Signed   By: Delbert Phenix M.D.   On: 10/24/2016 09:29   US Venous Img Lower Unilateral Right  Result Date: 11/12/2016 CLINICAL DATA:  Right lower extremity pain and edema for 8 years. Right leg pain. EXAM: RIGHT LOWER EXTREMITY VENOUS DOPPLER ULTRASOUND TECHNIQUE: Gray-scale sonography with graded compression, as well as color Doppler and duplex ultrasound were performed to evaluate the lower extremity deep venous systems from the level of the common femoral vein and including the common femoral, femoral, profunda femoral, popliteal and calf veins including the posterior tibial, peroneal and gastrocnemius veins when visible. The superficial great saphenous vein was also interrogated. Spectral Doppler was utilized to evaluate flow at rest and with distal augmentation maneuvers in the common femoral, femoral and popliteal veins. COMPARISON:  None. FINDINGS: Visualization of calf veins is limited due to patient anatomy. Contralateral Common Femoral Vein: Respiratory phasicity is normal and  symmetric with the symptomatic side. No evidence of thrombus. Normal compressibility. Common Femoral Vein: No evidence of thrombus. Saphenofemoral Junction: No evidence of thrombus. Profunda Femoral Vein: No evidence of thrombus. Femoral Vein: No evidence of thrombus. Popliteal Vein: No evidence of thrombus. Calf Veins: No evidence of thrombus. Superficial Great Saphenous Vein: No evidence of thrombus. Other Findings: There are numerous subcutaneous varicosities in the medial right calf with some sluggish blood flow but no thrombosis. IMPRESSION: 1. No evidence of DVT within the right lower extremity. 2. Extensive subcutaneous varicosities in the medial right calf. If considering varicose vein therapy, could obtain venous mapping in the IR clinic. Electronically Signed   By: Marnee Spring M.D.   On: 11/12/2016 16:13        Discharge Exam: Vitals:   11/12/16 2038 11/13/16 0555  BP: 110/68 112/64  Pulse: 68 70  Resp: 18 18  Temp: 98.2 F (36.8 C) 98.2 F (36.8 C)   Vitals:   11/12/16 1950 11/12/16 2038 11/13/16 0555 11/13/16 0752  BP:  110/68 112/64   Pulse: 68 68 70   Resp: 18 18 18    Temp:  98.2 F (36.8 C) 98.2 F (36.8 C)   TempSrc:  Oral Oral   SpO2: 99% 98% 98% 98%  Weight:   136 kg (299 lb 12.8 oz)   Height:        General: Pt is alert, awake, not in acute distress Cardiovascular: RRR, S1/S2 +, no rubs, no gallops Respiratory: CTA bilaterally, no wheezing, no rhonchi Abdominal: Soft, NT, ND, bowel sounds + Extremities: no edema, no cyanosis   The results of significant diagnostics from this hospitalization (including imaging, microbiology, ancillary and laboratory) are listed below for reference.    Significant Diagnostic Studies: Dg Chest 2 View  Result Date: 11/09/2016 CLINICAL DATA:  Shortness of breath. Bilateral lower extremity swelling. EXAM: CHEST  2 VIEW COMPARISON:  10/24/2016 FINDINGS: There is cardiomegaly with slight pulmonary vascular congestion. No  discrete pulmonary edema or effusions. The bones are normal. IMPRESSION: Cardiomegaly with slight pulmonary vascular congestion. Electronically Signed   By: Francene Boyers M.D.   On: 11/09/2016 08:32   Dg Chest 2 View  Result Date: 10/24/2016 CLINICAL DATA:  Dyspnea EXAM: CHEST  2 VIEW COMPARISON:  10/16/2016 chest radiograph. FINDINGS: Stable cardiomediastinal silhouette with mild cardiomegaly. No pneumothorax. No pleural effusion. Mild to moderate pulmonary edema. IMPRESSION: Mild-to-moderate congestive heart failure. Electronically Signed   By: Delbert Phenix M.D.   On: 10/24/2016 09:29   US Venous Img Lower Unilateral Right  Result Date: 11/12/2016 CLINICAL DATA:  Right  lower extremity pain and edema for 8 years. Right leg pain. EXAM: RIGHT LOWER EXTREMITY VENOUS DOPPLER ULTRASOUND TECHNIQUE: Gray-scale sonography with graded compression, as well as color Doppler and duplex ultrasound were performed to evaluate the lower extremity deep venous systems from the level of the common femoral vein and including the common femoral, femoral, profunda femoral, popliteal and calf veins including the posterior tibial, peroneal and gastrocnemius veins when visible. The superficial great saphenous vein was also interrogated. Spectral Doppler was utilized to evaluate flow at rest and with distal augmentation maneuvers in the common femoral, femoral and popliteal veins. COMPARISON:  None. FINDINGS: Visualization of calf veins is limited due to patient anatomy. Contralateral Common Femoral Vein: Respiratory phasicity is normal and symmetric with the symptomatic side. No evidence of thrombus. Normal compressibility. Common Femoral Vein: No evidence of thrombus. Saphenofemoral Junction: No evidence of thrombus. Profunda Femoral Vein: No evidence of thrombus. Femoral Vein: No evidence of thrombus. Popliteal Vein: No evidence of thrombus. Calf Veins: No evidence of thrombus. Superficial Great Saphenous Vein: No evidence of  thrombus. Other Findings: There are numerous subcutaneous varicosities in the medial right calf with some sluggish blood flow but no thrombosis. IMPRESSION: 1. No evidence of DVT within the right lower extremity. 2. Extensive subcutaneous varicosities in the medial right calf. If considering varicose vein therapy, could obtain venous mapping in the IR clinic. Electronically Signed   By: Marnee Spring M.D.   On: 11/12/2016 16:13     Microbiology: No results found for this or any previous visit (from the past 240 hour(s)).   Labs: Basic Metabolic Panel:  Recent Labs Lab 11/09/16 0800 11/10/16 0252 11/11/16 0515 11/12/16 0456 11/13/16 0551  NA 139 136 141 138 138  K 3.1* 3.3* 3.8 4.0 4.4  CL 105 101 106 104 106  CO2 27 29 26 27 26   GLUCOSE 93 104* 91 87 85  BUN 20 18 22* 26* 27*  CREATININE 1.13 1.26* 1.24 1.38* 1.28*  CALCIUM 8.6* 8.5* 8.5* 8.6* 8.7*  MG  --   --   --  2.0  --    Liver Function Tests: No results for input(s): AST, ALT, ALKPHOS, BILITOT, PROT, ALBUMIN in the last 168 hours. No results for input(s): LIPASE, AMYLASE in the last 168 hours. No results for input(s): AMMONIA in the last 168 hours. CBC:  Recent Labs Lab 11/09/16 0800  WBC 4.3  HGB 9.9*  HCT 32.1*  MCV 79.5  PLT 230   Cardiac Enzymes:  Recent Labs Lab 11/09/16 0800 11/09/16 1533 11/09/16 2057 11/10/16 0252  TROPONINI 0.04* 0.03* 0.03* <0.03   BNP: Invalid input(s): POCBNP CBG: No results for input(s): GLUCAP in the last 168 hours.  Time coordinating discharge:  Greater than 30 minutes  Signed:  Madgie Dhaliwal, DO [facility] Pager: [phone] 11/13/2016, 11:49 AM

## 2016-11-12 NOTE — Progress Notes (Signed)
PROGRESS NOTE  [name] WUJ:[id number] DOB: 1966/03/24 DOA: 11/09/2016 PCP: Pearson Grippe, MD  Brief History:  51 y.o.malewith medical history significant of CHF (numerous admissions for uncompensated CHF but patient continues to be noncompliant with diet and medications outpatient and has been fired from numerous cardiology clinics secondary to no showing for appointments), atrial fibrillation (on xarelto), HTN, MI, asthma, OSA (no on CPAP or BiPap) presents with progressive leg swelling since discharge from the hospital 2 weeks ago. Patient reports he has been taking his "water pill" as prescribed but that he feels like it just keeps the water in his legs. He felt progressively short of breath and has not been able to lay flat in bed (of note patient is laying flat in bed at time of interview). Patient reports dull chest pain that began as soon as he was discharged and he began taking his home medications. He says the pain in his chest would resolve with urination. Occasional dry cough. No nausea or vomiting, no diarrhea or constipation.   Assessment/Plan: Acute on chronic systolic CHF - dry weight around 290-294 -admission weight 306 -NEG 6 lbs for the admission -NEG 7 L but question accuracy - continue lasix to 40mg  IV BID  - continue aldactone - repeat Echo--EF 35-40 percent, diffuse HK, PSP 56, mild MR, +PFO - fluid restriction - monitor daily BMP   Chronic atrial fibrillation - Continue xarelto - continue coreg -rate controlled  HTN - continue lisinopril - PRN NTG - restarted coreg  GERD - continue protoninx  Elevated Troponin -secondary to CHF - troponins plateaued/flat  Hyperlipidemia -continue statin  Right>Left leg edema -Right venous duplex r/o DVT     Disposition Plan:   Home 5/25 if stable Family Communication:  No Family at bedside  Consultants:  none  Code Status:  FULL   DVT Prophylaxis:  Xarelto   Procedures: As Listed in Progress Note Above  Antibiotics: None       Subjective: Patient denies fevers, chills, headache, chest pain, dyspnea, nausea, vomiting, diarrhea, abdominal pain, dysuria, hematuria, hematochezia, and melena.   Objective: Vitals:   11/11/16 2012 11/11/16 2237 11/12/16 0549 11/12/16 0740  BP:  127/82 (!) 125/98   Pulse:  87 73   Resp:  20 20   Temp:  98.3 F (36.8 C) 98.7 F (37.1 C)   TempSrc:  Oral Oral   SpO2: 97% 97% 97% 100%  Weight:   (!) 136.4 kg (300 lb 12.8 oz)   Height:        Intake/Output Summary (Last 24 hours) at 11/12/16 1504 Last data filed at 11/12/16 1013  Gross per 24 hour  Intake              240 ml  Output             2225 ml  Net            -1985 ml   Weight change: -1.225 kg (-2 lb 11.2 oz) Exam:   General:  Pt is alert, follows commands appropriately, not in acute distress  HEENT: No icterus, No thrush, No neck mass, [place]/AT  Cardiovascular: IRRR, S1/S2, no rubs, no gallops  Respiratory: CTA bilaterally, no wheezing, no crackles, no rhonchi  Abdomen: Soft/+BS, non tender, non distended, no guarding  Extremities: 1 + LE R>L edema, No lymphangitis, No petechiae, No rashes, no synovitis   Data Reviewed: I have personally reviewed following labs and imaging  studies Basic Metabolic Panel:  Recent Labs Lab 11/09/16 0800 11/10/16 0252 11/11/16 0515 11/12/16 0456  NA 139 136 141 138  K 3.1* 3.3* 3.8 4.0  CL 105 101 106 104  CO2 27 29 26 27   GLUCOSE 93 104* 91 87  BUN 20 18 22* 26*  CREATININE 1.13 1.26* 1.24 1.38*  CALCIUM 8.6* 8.5* 8.5* 8.6*  MG  --   --   --  2.0   Liver Function Tests: No results for input(s): AST, ALT, ALKPHOS, BILITOT, PROT, ALBUMIN in the last 168 hours. No results for input(s): LIPASE, AMYLASE in the last 168 hours. No results for input(s): AMMONIA in the last 168 hours. Coagulation Profile: No results for input(s): INR, PROTIME in the last 168  hours. CBC:  Recent Labs Lab 11/09/16 0800  WBC 4.3  HGB 9.9*  HCT 32.1*  MCV 79.5  PLT 230   Cardiac Enzymes:  Recent Labs Lab 11/09/16 0800 11/09/16 1533 11/09/16 2057 11/10/16 0252  TROPONINI 0.04* 0.03* 0.03* <0.03   BNP: Invalid input(s): POCBNP CBG: No results for input(s): GLUCAP in the last 168 hours. HbA1C: No results for input(s): HGBA1C in the last 72 hours. Urine analysis:    Component Value Date/Time   COLORURINE AMBER (A) 10/24/2016 0923   APPEARANCEUR CLEAR 10/24/2016 0923   LABSPEC 1.027 10/24/2016 0923   PHURINE 5.0 10/24/2016 0923   GLUCOSEU NEGATIVE 10/24/2016 0923   GLUCOSEU NEG mg/dL 37/34/2876 8115   HGBUR NEGATIVE 10/24/2016 0923   BILIRUBINUR NEGATIVE 10/24/2016 0923   KETONESUR NEGATIVE 10/24/2016 0923   PROTEINUR 100 (A) 10/24/2016 0923   UROBILINOGEN 0.2 08/08/2014 1445   NITRITE NEGATIVE 10/24/2016 0923   LEUKOCYTESUR NEGATIVE 10/24/2016 0923   Sepsis Labs: @LABRCNTIP (procalcitonin:4,lacticidven:4) )No results found for this or any previous visit (from the past 240 hour(s)).   Scheduled Meds: . atorvastatin  40 mg Oral q1800  . budesonide (PULMICORT) nebulizer solution  0.25 mg Nebulization BID  . carvedilol  25 mg Oral BID WC  . furosemide  40 mg Intravenous BID  . lisinopril  5 mg Oral Daily  . potassium chloride  40 mEq Oral BID  . rivaroxaban  20 mg Oral Q supper  . spironolactone  50 mg Oral Daily  . tamsulosin  0.4 mg Oral Daily   Continuous Infusions:  Procedures/Studies: Dg Chest 2 View  Result Date: 11/09/2016 CLINICAL DATA:  Shortness of breath. Bilateral lower extremity swelling. EXAM: CHEST  2 VIEW COMPARISON:  10/24/2016 FINDINGS: There is cardiomegaly with slight pulmonary vascular congestion. No discrete pulmonary edema or effusions. The bones are normal. IMPRESSION: Cardiomegaly with slight pulmonary vascular congestion. Electronically Signed   By: Francene Boyers M.D.   On: 11/09/2016 08:32   Dg Chest 2  View  Result Date: 10/24/2016 CLINICAL DATA:  Dyspnea EXAM: CHEST  2 VIEW COMPARISON:  10/16/2016 chest radiograph. FINDINGS: Stable cardiomediastinal silhouette with mild cardiomegaly. No pneumothorax. No pleural effusion. Mild to moderate pulmonary edema. IMPRESSION: Mild-to-moderate congestive heart failure. Electronically Signed   By: Delbert Phenix M.D.   On: 10/24/2016 09:29    Pauline Pegues, DO  [facility] Pager [phone]  If 7PM-7AM, please contact night-coverage [email or web address] Password [email or web address] 11/12/2016, 3:04 PM   LOS: 3 days

## 2016-11-13 LAB — BASIC METABOLIC PANEL
ANION GAP: 6 (ref 5–15)
BUN: 27 mg/dL — AB (ref 6–20)
CHLORIDE: 106 mmol/L (ref 101–111)
CO2: 26 mmol/L (ref 22–32)
Calcium: 8.7 mg/dL — ABNORMAL LOW (ref 8.9–10.3)
Creatinine, Ser: 1.28 mg/dL — ABNORMAL HIGH (ref 0.61–1.24)
Glucose, Bld: 85 mg/dL (ref 65–99)
Potassium: 4.4 mmol/L (ref 3.5–5.1)
SODIUM: 138 mmol/L (ref 135–145)

## 2016-11-13 MED ORDER — BUMETANIDE 2 MG PO TABS: 2.0000 mg | ORAL_TABLET | Freq: Two times a day (BID) | ORAL | 1 refills | Status: DC

## 2016-11-13 MED ORDER — LISINOPRIL 5 MG PO TABS: 5.0000 mg | ORAL_TABLET | Freq: Every day | ORAL | 1 refills | Status: DC

## 2016-11-13 MED ORDER — CARVEDILOL 25 MG PO TABS: 25.0000 mg | ORAL_TABLET | Freq: Two times a day (BID) | ORAL | 1 refills | Status: DC

## 2016-11-13 NOTE — Care Management Important Message (Signed)
Important Message  Patient Details  Name: [name] MRN: [id number] Date of Birth: 09-Apr-1966   Medicare Important Message Given:  Yes    [name], RN 11/13/2016, 11:37 AM

## 2016-11-13 NOTE — Progress Notes (Signed)
Patient discharged with IV removed and site intact. Patient discharged with personal belongings and prescriptions.

## 2016-12-02 ENCOUNTER — Inpatient Hospital Stay (HOSPITAL_COMMUNITY)
Admission: EM | Admit: 2016-12-02 | Discharge: 2016-12-06 | DRG: 291 | Disposition: A | Discharge status: Home / Self Care | Payer: Medicare Other | Attending: Internal Medicine | Admitting: Internal Medicine

## 2016-12-02 ENCOUNTER — Emergency Department (HOSPITAL_COMMUNITY): Payer: Medicare Other

## 2016-12-02 ENCOUNTER — Encounter (HOSPITAL_COMMUNITY): Payer: Self-pay | Admitting: Emergency Medicine

## 2016-12-02 DIAGNOSIS — Z6830 Body mass index (BMI) 30.0-30.9, adult: Secondary | ICD-10-CM | POA: Diagnosis not present

## 2016-12-02 DIAGNOSIS — Z7951 Long term (current) use of inhaled steroids: Secondary | ICD-10-CM | POA: Diagnosis not present

## 2016-12-02 DIAGNOSIS — Z91030 Bee allergy status: Secondary | ICD-10-CM

## 2016-12-02 DIAGNOSIS — I272 Pulmonary hypertension, unspecified: Secondary | ICD-10-CM | POA: Diagnosis present

## 2016-12-02 DIAGNOSIS — Z7901 Long term (current) use of anticoagulants: Secondary | ICD-10-CM

## 2016-12-02 DIAGNOSIS — G4733 Obstructive sleep apnea (adult) (pediatric): Secondary | ICD-10-CM | POA: Diagnosis present

## 2016-12-02 DIAGNOSIS — Z823 Family history of stroke: Secondary | ICD-10-CM | POA: Diagnosis not present

## 2016-12-02 DIAGNOSIS — Z9114 Patient's other noncompliance with medication regimen: Secondary | ICD-10-CM

## 2016-12-02 DIAGNOSIS — Z886 Allergy status to analgesic agent status: Secondary | ICD-10-CM | POA: Diagnosis not present

## 2016-12-02 DIAGNOSIS — N182 Chronic kidney disease, stage 2 (mild): Secondary | ICD-10-CM

## 2016-12-02 DIAGNOSIS — I428 Other cardiomyopathies: Secondary | ICD-10-CM | POA: Diagnosis present

## 2016-12-02 DIAGNOSIS — Z9119 Patient's noncompliance with other medical treatment and regimen: Secondary | ICD-10-CM

## 2016-12-02 DIAGNOSIS — J45909 Unspecified asthma, uncomplicated: Secondary | ICD-10-CM | POA: Diagnosis present

## 2016-12-02 DIAGNOSIS — Z888 Allergy status to other drugs, medicaments and biological substances status: Secondary | ICD-10-CM

## 2016-12-02 DIAGNOSIS — I10 Essential (primary) hypertension: Secondary | ICD-10-CM | POA: Diagnosis present

## 2016-12-02 DIAGNOSIS — I5023 Acute on chronic systolic (congestive) heart failure: Secondary | ICD-10-CM | POA: Diagnosis present

## 2016-12-02 DIAGNOSIS — R600 Localized edema: Secondary | ICD-10-CM | POA: Diagnosis present

## 2016-12-02 DIAGNOSIS — I130 Hypertensive heart and chronic kidney disease with heart failure and stage 1 through stage 4 chronic kidney disease, or unspecified chronic kidney disease: Principal | ICD-10-CM | POA: Diagnosis present

## 2016-12-02 DIAGNOSIS — Z79899 Other long term (current) drug therapy: Secondary | ICD-10-CM | POA: Diagnosis not present

## 2016-12-02 DIAGNOSIS — Z8249 Family history of ischemic heart disease and other diseases of the circulatory system: Secondary | ICD-10-CM | POA: Diagnosis not present

## 2016-12-02 DIAGNOSIS — Z91018 Allergy to other foods: Secondary | ICD-10-CM

## 2016-12-02 DIAGNOSIS — I252 Old myocardial infarction: Secondary | ICD-10-CM

## 2016-12-02 DIAGNOSIS — E876 Hypokalemia: Secondary | ICD-10-CM

## 2016-12-02 DIAGNOSIS — I4891 Unspecified atrial fibrillation: Secondary | ICD-10-CM | POA: Diagnosis present

## 2016-12-02 DIAGNOSIS — Z87891 Personal history of nicotine dependence: Secondary | ICD-10-CM

## 2016-12-02 DIAGNOSIS — I5020 Unspecified systolic (congestive) heart failure: Secondary | ICD-10-CM

## 2016-12-02 DIAGNOSIS — K219 Gastro-esophageal reflux disease without esophagitis: Secondary | ICD-10-CM | POA: Diagnosis present

## 2016-12-02 DIAGNOSIS — R0602 Shortness of breath: Secondary | ICD-10-CM | POA: Diagnosis not present

## 2016-12-02 LAB — COMPREHENSIVE METABOLIC PANEL
ALK PHOS: 96 U/L (ref 38–126)
ALT: 21 U/L (ref 17–63)
ANION GAP: 9 (ref 5–15)
AST: 26 U/L (ref 15–41)
Albumin: 3.7 g/dL (ref 3.5–5.0)
BILIRUBIN TOTAL: 1.4 mg/dL — AB (ref 0.3–1.2)
BUN: 17 mg/dL (ref 6–20)
CALCIUM: 8.7 mg/dL — AB (ref 8.9–10.3)
CO2: 25 mmol/L (ref 22–32)
Chloride: 103 mmol/L (ref 101–111)
Creatinine, Ser: 1.04 mg/dL (ref 0.61–1.24)
Glucose, Bld: 99 mg/dL (ref 65–99)
Potassium: 3.3 mmol/L — ABNORMAL LOW (ref 3.5–5.1)
Sodium: 137 mmol/L (ref 135–145)
TOTAL PROTEIN: 7 g/dL (ref 6.5–8.1)

## 2016-12-02 LAB — CBC WITH DIFFERENTIAL/PLATELET
Basophils Absolute: 0 10*3/uL (ref 0.0–0.1)
Basophils Relative: 0 %
EOS ABS: 0.1 10*3/uL (ref 0.0–0.7)
Eosinophils Relative: 3 %
HEMATOCRIT: 33.1 % — AB (ref 39.0–52.0)
HEMOGLOBIN: 10 g/dL — AB (ref 13.0–17.0)
LYMPHS ABS: 1.1 10*3/uL (ref 0.7–4.0)
Lymphocytes Relative: 26 %
MCH: 24.3 pg — ABNORMAL LOW (ref 26.0–34.0)
MCHC: 30.2 g/dL (ref 30.0–36.0)
MCV: 80.5 fL (ref 78.0–100.0)
Monocytes Absolute: 0.4 10*3/uL (ref 0.1–1.0)
Monocytes Relative: 10 %
NEUTROS PCT: 61 %
Neutro Abs: 2.6 10*3/uL (ref 1.7–7.7)
Platelets: 199 10*3/uL (ref 150–400)
RBC: 4.11 MIL/uL — ABNORMAL LOW (ref 4.22–5.81)
RDW: 17.2 % — ABNORMAL HIGH (ref 11.5–15.5)
WBC: 4.2 10*3/uL (ref 4.0–10.5)

## 2016-12-02 LAB — BRAIN NATRIURETIC PEPTIDE: B Natriuretic Peptide: 703 pg/mL — ABNORMAL HIGH (ref 0.0–100.0)

## 2016-12-02 LAB — TROPONIN I: TROPONIN I: 0.03 ng/mL — AB (ref <0.03)

## 2016-12-02 MED ORDER — FUROSEMIDE 10 MG/ML IJ SOLN
40.0000 mg | Freq: Once | INTRAMUSCULAR | Status: AC
  Administered 2016-12-02: 40 mg via INTRAVENOUS
  Filled 2016-12-02: qty 4

## 2016-12-02 MED ORDER — FUROSEMIDE 10 MG/ML IJ SOLN: 80.0000 mg | Freq: Once | INTRAMUSCULAR | Status: DC

## 2016-12-02 NOTE — ED Notes (Signed)
Received report on pt, pt sitting on side of bed, states that he is not breathing any better and that his legs are cramping, update given,

## 2016-12-02 NOTE — ED Notes (Signed)
Date and time results received: 12/02/16 2150  Test: Troponin Critical Value: 0.03  Name of Provider Notified: [facility]  Orders Received? Or Actions Taken?: no new orders at this time

## 2016-12-02 NOTE — ED Triage Notes (Signed)
Pt reports increase SOB and BLE edema in past 2 days. States 6lb weight gain in two days. Denies missing dose of medications, CP, or high salt intake. 99% on RA, walked from home to ambulance.

## 2016-12-02 NOTE — H&P (Signed)
History and Physical    [name] JWJ:[id number] DOB: 19-Jul-1965 DOA: 12/02/2016  PCP: Pearson Grippe, MD  Patient coming from: Home.    Chief Complaint:   Bilateral leg swelling and SOB>   HPI: [name] is an 51 y.o. male with frequent hospitalization for CHF, leg swelling and fluid retention, severe non compliant with meds and diet, being terminated from several cardiology groups, hx of afib on Xarelto, HTN, MI ashtma, OSA, presented to the ER with swelling of the lower legs, unable to stand on it, and having SOB.  He has no CP, fever, chills, orthopnea, or coughs.  Evalution in the ER showed CXR with vascular congestion but no infiltrates, BNP same as baseline at 700's, and normal Cr with K of 3.1.  He has no leukocytosis and serology was otherwise unremarkable.   Hospitalist was asked to admit him for diuresis.  Currently, he is on oral bumex.   ED Course:  See above.  Rewiew of Systems:  Constitutional: Negative for malaise, fever and chills. No significant weight loss or weight gain Eyes: Negative for eye pain, redness and discharge, diplopia, visual changes, or flashes of light. ENMT: Negative for ear pain, hoarseness, nasal congestion, sinus pressure and sore throat. No headaches; tinnitus, drooling, or problem swallowing. Cardiovascular: Negative for chest pain, palpitations, diaphoresis,  ; No orthopnea, PND Respiratory: Negative for cough, hemoptysis, wheezing and stridor. No pleuritic chestpain. Gastrointestinal: Negative for diarrhea, constipation,  melena, blood in stool, hematemesis, jaundice and rectal bleeding.    Genitourinary: Negative for frequency, dysuria, incontinence,flank pain and hematuria; Musculoskeletal: Negative for back pain and neck pain. Negative for  and trauma.;  Skin: . Negative for pruritus, rash, abrasions, bruising and skin lesion.; ulcerations Neuro: Negative for headache, lightheadedness and neck stiffness. Negative for weakness, altered level of  consciousness , altered mental status, extremity weakness, burning feet, involuntary movement, seizure and syncope.  Psych: negative for anxiety, depression, insomnia, tearfulness, panic attacks, hallucinations, paranoia, suicidal or homicidal ideation    Past Medical History:  Diagnosis Date  . Allergic rhinitis   . Asthma   . Atrial fibrillation (HCC)   . CHF (congestive heart failure) (HCC)   . Essential hypertension   . Gastroesophageal reflux disease   . Heart attack (HCC)   . History of cardiac catheterization    Normal coronaries December 2016  . Noncompliance    Major problem leading to declining health and recurrent hospitalization  . Nonischemic cardiomyopathy (HCC)    LVEF 20-25%  . OSA (obstructive sleep apnea)   . Osteoarthritis   . Peptic ulcer disease    Past Surgical History:  Procedure Laterality Date  . CARDIAC CATHETERIZATION N/A 06/14/2015   Procedure: Right/Left Heart Cath and Coronary Angiography;  Surgeon: Runell Gess, MD;  Location: [facility] INVASIVE CV LAB;  Service: Cardiovascular;  Laterality: N/A;     reports that he quit smoking about 9 years ago. His smoking use included Cigarettes. He started smoking about 28 years ago. He has a 10.00 pack-year smoking history. He has never used smokeless tobacco. He reports that he does not drink alcohol or use drugs.  Allergies  Allergen Reactions  . Bee Venom Shortness Of Breath, Swelling and Other (See Comments)    Reaction:  Facial swelling  . Food Shortness Of Breath, Rash and Other (See Comments)    Pt states that he is allergic to strawberries.    . Lipitor [Atorvastatin] Shortness Of Breath and Other (See Comments)    Reaction:  Nose bleeds   . Aspirin Other (See Comments)    Reaction:  GI upset   . Banana Diarrhea  . Metolazone Other (See Comments)    Pt states that he stopped taking this med due to heart attack like symptoms.   . Oatmeal Nausea And Vomiting  . Orange Juice [Orange Oil] Nausea And  Vomiting  . Torsemide Swelling and Other (See Comments)    Reaction:  Swelling of feet/legs   . Diltiazem Palpitations  . Hydralazine Palpitations    Family History  Problem Relation Age of Onset  . Stroke Father   . Heart attack Father   . Aneurysm Mother        Cerebral aneurysm  . Hypertension Sister   . Colon cancer Neg Hx   . Inflammatory bowel disease Neg Hx   . Liver disease Neg Hx      Prior to Admission medications   Medication Sig Start Date End Date Taking? Authorizing Provider  albuterol (PROVENTIL) (2.5 MG/3ML) 0.083% nebulizer solution Take 2.5 mg by nebulization every 6 (six) hours as needed for wheezing or shortness of breath.    [provider]  atorvastatin (LIPITOR) 40 MG tablet Take 1 tablet (40 mg total) by mouth daily at 6 PM. 06/25/16   Kathlen Mody, MD  beclomethasone (QVAR) 80 MCG/ACT inhaler Inhale 2 puffs into the lungs 2 (two) times daily.     [provider]  bumetanide (BUMEX) 2 MG tablet Take 1 tablet (2 mg total) by mouth 2 (two) times daily. 11/13/16   Catarina Hartshorn, MD  carvedilol (COREG) 25 MG tablet Take 1 tablet (25 mg total) by mouth 2 (two) times daily with a meal. 11/13/16   Tat, Onalee Hua, MD  docusate sodium (COLACE) 100 MG capsule Take 1 capsule (100 mg total) by mouth 2 (two) times daily. Patient taking differently: Take 100 mg by mouth 2 (two) times daily as needed for mild constipation.  06/25/16   Kathlen Mody, MD  ferrous gluconate (FERGON) 324 MG tablet Take 1 tablet (324 mg total) by mouth 2 (two) times daily with a meal. 06/25/16   Kathlen Mody, MD  lisinopril (PRINIVIL,ZESTRIL) 5 MG tablet Take 1 tablet (5 mg total) by mouth daily. 11/13/16   Catarina Hartshorn, MD  pantoprazole (PROTONIX) 40 MG tablet Take 1 tablet (40 mg total) by mouth daily at 6 (six) AM. Patient taking differently: Take 40 mg by mouth every evening.  06/26/16   Kathlen Mody, MD  potassium chloride SA (K-DUR,KLOR-CON) 20 MEQ tablet Take 2 tablets (40 mEq total) by  mouth daily. 10/27/16   Filbert Schilder, MD  rivaroxaban (XARELTO) 20 MG TABS tablet Take 1 tablet (20 mg total) by mouth daily with breakfast. Patient taking differently: Take 20 mg by mouth daily with supper.  06/26/16   Kathlen Mody, MD  spironolactone (ALDACTONE) 50 MG tablet Take 1 tablet (50 mg total) by mouth daily. 10/02/16   Houston Siren, MD  tamsulosin (FLOMAX) 0.4 MG CAPS capsule Take 1 capsule (0.4 mg total) by mouth daily. 06/25/16   Kathlen Mody, MD    Physical Exam: Vitals:   12/02/16 2050 12/02/16 2055 12/02/16 2246 12/02/16 2300  BP:  (!) 157/99 (!) 162/111 (!) 161/106  Pulse:  (!) 101 (!) 106 (!) 107  Resp:  (!) 21 (!) 22 16  Temp:  98.6 F (37 C)    TempSrc:  Oral    SpO2:  98% 99% 100%  Weight: (!) 143.3 kg (316 lb)  Height: 6' (1.829 m)         Constitutional: NAD, calm, comfortable Vitals:   12/02/16 2050 12/02/16 2055 12/02/16 2246 12/02/16 2300  BP:  (!) 157/99 (!) 162/111 (!) 161/106  Pulse:  (!) 101 (!) 106 (!) 107  Resp:  (!) 21 (!) 22 16  Temp:  98.6 F (37 C)    TempSrc:  Oral    SpO2:  98% 99% 100%  Weight: (!) 143.3 kg (316 lb)     Height: 6' (1.829 m)      Eyes: PERRL, lids and conjunctivae normal ENMT: Mucous membranes are moist. Posterior pharynx clear of any exudate or lesions.Normal dentition.  Neck: normal, supple, no masses, no thyromegaly Respiratory: clear to auscultation bilaterally, no wheezing, no crackles. Normal respiratory effort. No accessory muscle use.  Cardiovascular: Regular rate and rhythm, no murmurs / rubs / gallops. No extremity edema. 2+ pedal pulses. No carotid bruits.  Abdomen: no tenderness, no masses palpated. No hepatosplenomegaly. Bowel sounds positive.  Musculoskeletal: no clubbing / cyanosis. Chronic venous changes bilaterally.  4+ edema bilaterally.  Good ROM, no contractures. Normal muscle tone.  Skin: no rashes, lesions, ulcers. No induration Neurologic: CN 2-12 grossly intact. Sensation intact, DTR  normal. Strength 5/5 in all 4.  Psychiatric: Normal judgment and insight. Alert and oriented x 3. Normal mood.   Labs on Admission: I have personally reviewed following labs and imaging studies  CBC:  Recent Labs Lab 12/02/16 2055  WBC 4.2  NEUTROABS 2.6  HGB 10.0*  HCT 33.1*  MCV 80.5  PLT 199   Basic Metabolic Panel:  Recent Labs Lab 12/02/16 2055  NA 137  K 3.3*  CL 103  CO2 25  GLUCOSE 99  BUN 17  CREATININE 1.04  CALCIUM 8.7*   GFR: Estimated Creatinine Clearance: 127.7 mL/min (by C-G formula based on SCr of 1.04 mg/dL). Liver Function Tests:  Recent Labs Lab 12/02/16 2055  AST 26  ALT 21  ALKPHOS 96  BILITOT 1.4*  PROT 7.0  ALBUMIN 3.7   Cardiac Enzymes:  Recent Labs Lab 12/02/16 2055  TROPONINI 0.03*   Urine analysis:    Component Value Date/Time   COLORURINE AMBER (A) 10/24/2016 0923   APPEARANCEUR CLEAR 10/24/2016 0923   LABSPEC 1.027 10/24/2016 0923   PHURINE 5.0 10/24/2016 0923   GLUCOSEU NEGATIVE 10/24/2016 0923   GLUCOSEU NEG mg/dL 40/98/1191 4782   HGBUR NEGATIVE 10/24/2016 0923   BILIRUBINUR NEGATIVE 10/24/2016 0923   KETONESUR NEGATIVE 10/24/2016 0923   PROTEINUR 100 (A) 10/24/2016 0923   UROBILINOGEN 0.2 08/08/2014 1445   NITRITE NEGATIVE 10/24/2016 0923   LEUKOCYTESUR NEGATIVE 10/24/2016 0923   Radiological Exams on Admission: Dg Chest Portable 1 View  Result Date: 12/02/2016 CLINICAL DATA:  Acute shortness of breath and cough. EXAM: PORTABLE CHEST 1 VIEW COMPARISON:  11/25/2016 and prior exams FINDINGS: Cardiomegaly and pulmonary vascular congestion again noted. There is no evidence of focal airspace disease, pulmonary edema, suspicious pulmonary nodule/mass, pleural effusion, or pneumothorax. No acute bony abnormalities are identified. IMPRESSION: Cardiomegaly and pulmonary vascular congestion. Electronically Signed   By: Harmon Pier M.D.   On: 12/02/2016 21:26    EKG: Independently reviewed.   Assessment/Plan Active  Problems:   OBESITY, MORBID   Hypertension   GERD (gastroesophageal reflux disease)   Hypokalemia   Pulmonary hypertension (HCC)   OSA (obstructive sleep apnea)   Nonischemic cardiomyopathy (HCC)   CKD (chronic kidney disease), stage II   Acute on chronic systolic CHF (congestive heart failure) (  HCC)   Pedal edema    PLAN:   Bilateral pedal edema:  Its unclear how much is venous insufficiency, but he is severely symptomatic, so I will aggressively diurese him.   Will start IV Lasix drip at 8mg  per hour.   With his hx of noncompliant, it is difficult go gauge the dosage on discharge.   Hypokalemia:  Will give BID supplement.  Pulmonary HTN:  WIll continue with meds.  CKD:  Stable.  Follow with aggressive diuresis.   HTN:  Continue with meds.  Should improve with diuresis.   DVT prophylaxis: Xarelto. Code Status: FULL CODE.  Family Communication: None.  Disposition Plan: To home.  Consults called: None. Admission status: Inpatient.    Juline Sanderford MD FACP. [facility]  If 7PM-7AM, please contact night-coverage [email or web address] Password [facility]  12/02/2016, 11:59 PM

## 2016-12-02 NOTE — ED Notes (Signed)
Per MD Zammit hold 80mg  of Lasix at this time.

## 2016-12-02 NOTE — ED Provider Notes (Signed)
[facility]-EMERGENCY DEPT Provider Note   CSN: [id number] Arrival date & time: 12/02/16  2048     History   Chief Complaint Chief Complaint  Patient presents with  . Shortness of Breath    HPI KARLON SCHLAFER is a 51 y.o. male.  Patient complains of shortness of breath and worsening swelling to his legs. Patient has a history of anasarca and congestive heart failure   The history is provided by the patient.  Shortness of Breath  This is a recurrent problem. The problem occurs continuously.The current episode started more than 2 days ago. The problem has not changed since onset.Pertinent negatives include no fever, no headaches, no cough, no chest pain, no abdominal pain and no rash.    Past Medical History:  Diagnosis Date  . Allergic rhinitis   . Asthma   . Atrial fibrillation (HCC)   . CHF (congestive heart failure) (HCC)   . Essential hypertension   . Gastroesophageal reflux disease   . Heart attack (HCC)   . History of cardiac catheterization    Normal coronaries December 2016  . Noncompliance    Major problem leading to declining health and recurrent hospitalization  . Nonischemic cardiomyopathy (HCC)    LVEF 20-25%  . OSA (obstructive sleep apnea)   . Osteoarthritis   . Peptic ulcer disease     Patient Active Problem List   Diagnosis Date Noted  . Acute CHF (congestive heart failure) (HCC) 11/09/2016  . Acute on chronic systolic CHF (congestive heart failure) (HCC) 08/19/2016  . CKD (chronic kidney disease), stage II 05/17/2016  . Coronary arteries, normal Dec 2016 01/23/2016  . Chronic anticoagulation-Xarelto 01/23/2016  . NSVT (nonsustained ventricular tachycardia) (HCC) 01/23/2016  . Elevated troponin 12/21/2015  . Nonischemic cardiomyopathy (HCC)   . Morbid obesity due to excess calories (HCC)   . Noncompliance with diet and medication regimen 12/02/2015  . OSA (obstructive sleep apnea) 09/20/2015  . Pulmonary hypertension (HCC) 09/19/2015  . Normocytic  anemia 09/19/2015  . Chronic atrial fibrillation (HCC)   . Dyspnea on exertion 05/28/2015  . Acute respiratory failure with hypoxia (HCC) 04/01/2015  . Hypokalemia 04/01/2015  . Obesity hypoventilation syndrome (HCC) 08/08/2014  . GERD (gastroesophageal reflux disease) 08/08/2014  . Edema of right lower extremity 06/21/2014  . Fecal urgency 06/21/2014  . Asthma 02/11/2009  . Hyperlipidemia 07/12/2008  . OBESITY, MORBID 07/12/2008  . Anxiety state 07/12/2008  . DEPRESSION 07/12/2008  . Hypertension 07/12/2008  . ALLERGIC RHINITIS 07/12/2008  . Peptic ulcer 07/12/2008  . OSTEOARTHRITIS 07/12/2008    Past Surgical History:  Procedure Laterality Date  . CARDIAC CATHETERIZATION N/A 06/14/2015   Procedure: Right/Left Heart Cath and Coronary Angiography;  Surgeon: Runell Gess, MD;  Location: [facility] INVASIVE CV LAB;  Service: Cardiovascular;  Laterality: N/A;       Home Medications    Prior to Admission medications   Medication Sig Start Date End Date Taking? Authorizing Provider  albuterol (PROVENTIL) (2.5 MG/3ML) 0.083% nebulizer solution Take 2.5 mg by nebulization every 6 (six) hours as needed for wheezing or shortness of breath.    [provider]  atorvastatin (LIPITOR) 40 MG tablet Take 1 tablet (40 mg total) by mouth daily at 6 PM. 06/25/16   Kathlen Mody, MD  beclomethasone (QVAR) 80 MCG/ACT inhaler Inhale 2 puffs into the lungs 2 (two) times daily.     [provider]  bumetanide (BUMEX) 2 MG tablet Take 1 tablet (2 mg total) by mouth 2 (two) times daily. 11/13/16  Catarina Hartshorn, MD  carvedilol (COREG) 25 MG tablet Take 1 tablet (25 mg total) by mouth 2 (two) times daily with a meal. 11/13/16   Tat, Onalee Hua, MD  docusate sodium (COLACE) 100 MG capsule Take 1 capsule (100 mg total) by mouth 2 (two) times daily. Patient taking differently: Take 100 mg by mouth 2 (two) times daily as needed for mild constipation.  06/25/16   Kathlen Mody, MD  ferrous gluconate  (FERGON) 324 MG tablet Take 1 tablet (324 mg total) by mouth 2 (two) times daily with a meal. 06/25/16   Kathlen Mody, MD  lisinopril (PRINIVIL,ZESTRIL) 5 MG tablet Take 1 tablet (5 mg total) by mouth daily. 11/13/16   Catarina Hartshorn, MD  pantoprazole (PROTONIX) 40 MG tablet Take 1 tablet (40 mg total) by mouth daily at 6 (six) AM. Patient taking differently: Take 40 mg by mouth every evening.  06/26/16   Kathlen Mody, MD  potassium chloride SA (K-DUR,KLOR-CON) 20 MEQ tablet Take 2 tablets (40 mEq total) by mouth daily. 10/27/16   Filbert Schilder, MD  rivaroxaban (XARELTO) 20 MG TABS tablet Take 1 tablet (20 mg total) by mouth daily with breakfast. Patient taking differently: Take 20 mg by mouth daily with supper.  06/26/16   Kathlen Mody, MD  spironolactone (ALDACTONE) 50 MG tablet Take 1 tablet (50 mg total) by mouth daily. 10/02/16   Houston Siren, MD  tamsulosin (FLOMAX) 0.4 MG CAPS capsule Take 1 capsule (0.4 mg total) by mouth daily. 06/25/16   Kathlen Mody, MD    Family History Family History  Problem Relation Age of Onset  . Stroke Father   . Heart attack Father   . Aneurysm Mother        Cerebral aneurysm  . Hypertension Sister   . Colon cancer Neg Hx   . Inflammatory bowel disease Neg Hx   . Liver disease Neg Hx     Social History Social History  Substance Use Topics  . Smoking status: Former Smoker    Packs/day: 0.50    Years: 20.00    Types: Cigarettes    Start date: 04/26/1988    Quit date: 06/23/2007  . Smokeless tobacco: Never Used     Comment: 1 ppd former smoker  . Alcohol use No     Comment: No etoh since 2009     Allergies   Bee venom; Food; Lipitor [atorvastatin]; Aspirin; Banana; Metolazone; Oatmeal; Orange juice [orange oil]; Torsemide; Diltiazem; and Hydralazine   Review of Systems Review of Systems  Constitutional: Negative for appetite change, fatigue and fever.  HENT: Negative for congestion, ear discharge and sinus pressure.   Eyes: Negative for  discharge.  Respiratory: Positive for shortness of breath. Negative for cough.   Cardiovascular: Negative for chest pain.  Gastrointestinal: Negative for abdominal pain and diarrhea.  Genitourinary: Negative for frequency and hematuria.  Musculoskeletal: Negative for back pain.  Skin: Negative for rash.       Swelling of leg  Neurological: Negative for seizures and headaches.  Psychiatric/Behavioral: Negative for hallucinations.     Physical Exam Updated Vital Signs BP (!) 162/111 (BP Location: Left Arm)   Pulse (!) 106   Temp 98.6 F (37 C) (Oral)   Resp (!) 22   Ht 6' (1.829 m)   Wt (!) 143.3 kg (316 lb)   SpO2 99%   BMI 42.86 kg/m   Physical Exam  Constitutional: He is oriented to person, place, and time. He appears well-developed.  HENT:  Head: Normocephalic.  Eyes: Conjunctivae and EOM are normal. No scleral icterus.  Neck: Neck supple. No thyromegaly present.  Cardiovascular: Normal rate and regular rhythm.  Exam reveals no gallop and no friction rub.   No murmur heard. Pulmonary/Chest: No stridor. He has no wheezes. He has no rales. He exhibits no tenderness.  Abdominal: He exhibits no distension. There is no tenderness. There is no rebound.  Musculoskeletal: Normal range of motion. He exhibits no edema.  3+ edema both legs and swelling in his thighs  Lymphadenopathy:    He has no cervical adenopathy.  Neurological: He is oriented to person, place, and time. He exhibits normal muscle tone. Coordination normal.  Skin: No rash noted. No erythema.  Psychiatric: He has a normal mood and affect. His behavior is normal.     ED Treatments / Results  Labs (all labs ordered are listed, but only abnormal results are displayed) Labs Reviewed  CBC WITH DIFFERENTIAL/PLATELET - Abnormal; Notable for the following:       Result Value   RBC 4.11 (*)    Hemoglobin 10.0 (*)    HCT 33.1 (*)    MCH 24.3 (*)    RDW 17.2 (*)    All other components within normal limits    COMPREHENSIVE METABOLIC PANEL - Abnormal; Notable for the following:    Potassium 3.3 (*)    Calcium 8.7 (*)    Total Bilirubin 1.4 (*)    All other components within normal limits  BRAIN NATRIURETIC PEPTIDE - Abnormal; Notable for the following:    B Natriuretic Peptide 703.0 (*)    All other components within normal limits  TROPONIN I - Abnormal; Notable for the following:    Troponin I 0.03 (*)    All other components within normal limits    EKG  EKG Interpretation  Date/Time:  Wednesday December 02 2016 20:52:17 EDT Ventricular Rate:  95 PR Interval:    QRS Duration: 98 QT Interval:  374 QTC Calculation: 471 R Axis:   70 Text Interpretation:  Atrial fibrillation Ventricular premature complex Nonspecific T abnormalities, lateral leads Confirmed by Bryam Taborda  MD, Peniel Hass [phone]) on 12/02/2016 10:18:27 PM       Radiology Dg Chest Portable 1 View  Result Date: 12/02/2016 CLINICAL DATA:  Acute shortness of breath and cough. EXAM: PORTABLE CHEST 1 VIEW COMPARISON:  11/25/2016 and prior exams FINDINGS: Cardiomegaly and pulmonary vascular congestion again noted. There is no evidence of focal airspace disease, pulmonary edema, suspicious pulmonary nodule/mass, pleural effusion, or pneumothorax. No acute bony abnormalities are identified. IMPRESSION: Cardiomegaly and pulmonary vascular congestion. Electronically Signed   By: Harmon Pier M.D.   On: 12/02/2016 21:26    Procedures Procedures (including critical care time)  Medications Ordered in ED Medications  furosemide (LASIX) injection 80 mg (not administered)  furosemide (LASIX) injection 40 mg (40 mg Intravenous Given 12/02/16 2113)     Initial Impression / Assessment and Plan / ED Course  I have reviewed the triage vital signs and the nursing notes.  Pertinent labs & imaging results that were available during my care of the patient were reviewed by me and considered in my medical decision making (see chart for details).      Patient will be admitted with congestive heart failure and anasarca. He will get diuresed on admission  Final Clinical Impressions(s) / ED Diagnoses   Final diagnoses:  Systolic congestive heart failure, unspecified HF chronicity (HCC)    New Prescriptions New Prescriptions   No medications on file  Bethann Berkshire, MD 12/02/16 2308

## 2016-12-02 NOTE — ED Notes (Signed)
Dr Le at bedside,  

## 2016-12-03 DIAGNOSIS — I5023 Acute on chronic systolic (congestive) heart failure: Secondary | ICD-10-CM

## 2016-12-03 LAB — CBC
HEMATOCRIT: 34.7 % — AB (ref 39.0–52.0)
HEMOGLOBIN: 10.6 g/dL — AB (ref 13.0–17.0)
MCH: 24.5 pg — AB (ref 26.0–34.0)
MCHC: 30.5 g/dL (ref 30.0–36.0)
MCV: 80.1 fL (ref 78.0–100.0)
Platelets: 206 10*3/uL (ref 150–400)
RBC: 4.33 MIL/uL (ref 4.22–5.81)
RDW: 17.4 % — ABNORMAL HIGH (ref 11.5–15.5)
WBC: 3.8 10*3/uL — ABNORMAL LOW (ref 4.0–10.5)

## 2016-12-03 LAB — BASIC METABOLIC PANEL
ANION GAP: 9 (ref 5–15)
BUN: 16 mg/dL (ref 6–20)
CALCIUM: 8.9 mg/dL (ref 8.9–10.3)
CHLORIDE: 102 mmol/L (ref 101–111)
CO2: 30 mmol/L (ref 22–32)
Creatinine, Ser: 1.05 mg/dL (ref 0.61–1.24)
GFR calc Af Amer: >60 mL/min (ref >60)
GFR calc non Af Amer: >60 mL/min (ref >60)
GLUCOSE: 93 mg/dL (ref 65–99)
POTASSIUM: 3.4 mmol/L — AB (ref 3.5–5.1)
Sodium: 141 mmol/L (ref 135–145)

## 2016-12-03 LAB — TSH: TSH: 2.954 u[IU]/mL (ref 0.350–4.500)

## 2016-12-03 MED ORDER — LISINOPRIL 5 MG PO TABS
5.0000 mg | ORAL_TABLET | Freq: Every day | ORAL | Status: DC
  Administered 2016-12-03 – 2016-12-06 (×4): 5 mg via ORAL
  Filled 2016-12-03 (×4): qty 1

## 2016-12-03 MED ORDER — FUROSEMIDE 10 MG/ML IJ SOLN
INTRAMUSCULAR | Status: AC
  Filled 2016-12-03: qty 30

## 2016-12-03 MED ORDER — DEXTROSE 5 % IV SOLN
8.0000 mg/h | INTRAVENOUS | Status: DC
  Administered 2016-12-03 – 2016-12-04 (×2): 8 mg/h via INTRAVENOUS
  Filled 2016-12-03 (×4): qty 25

## 2016-12-03 MED ORDER — POTASSIUM CHLORIDE CRYS ER 20 MEQ PO TBCR
40.0000 meq | EXTENDED_RELEASE_TABLET | Freq: Every day | ORAL | Status: DC
  Administered 2016-12-03 – 2016-12-06 (×4): 40 meq via ORAL
  Filled 2016-12-03 (×4): qty 2

## 2016-12-03 MED ORDER — BUDESONIDE 0.25 MG/2ML IN SUSP
0.2500 mg | Freq: Two times a day (BID) | RESPIRATORY_TRACT | Status: DC
  Administered 2016-12-03 – 2016-12-05 (×7): 0.25 mg via RESPIRATORY_TRACT
  Filled 2016-12-03 (×8): qty 2

## 2016-12-03 MED ORDER — ALBUTEROL SULFATE (2.5 MG/3ML) 0.083% IN NEBU: 2.5000 mg | INHALATION_SOLUTION | Freq: Four times a day (QID) | RESPIRATORY_TRACT | Status: DC | PRN

## 2016-12-03 MED ORDER — ATORVASTATIN CALCIUM 40 MG PO TABS
40.0000 mg | ORAL_TABLET | Freq: Every day | ORAL | Status: DC
  Administered 2016-12-03 – 2016-12-05 (×3): 40 mg via ORAL
  Filled 2016-12-03 (×3): qty 1

## 2016-12-03 MED ORDER — RIVAROXABAN 20 MG PO TABS
20.0000 mg | ORAL_TABLET | Freq: Every day | ORAL | Status: DC
  Administered 2016-12-03 – 2016-12-05 (×3): 20 mg via ORAL
  Filled 2016-12-03 (×3): qty 1

## 2016-12-03 MED ORDER — DOCUSATE SODIUM 100 MG PO CAPS: 100.0000 mg | ORAL_CAPSULE | Freq: Two times a day (BID) | ORAL | Status: DC | PRN

## 2016-12-03 MED ORDER — TAMSULOSIN HCL 0.4 MG PO CAPS
0.4000 mg | ORAL_CAPSULE | Freq: Every day | ORAL | Status: DC
  Administered 2016-12-03 – 2016-12-06 (×4): 0.4 mg via ORAL
  Filled 2016-12-03 (×5): qty 1

## 2016-12-03 MED ORDER — PANTOPRAZOLE SODIUM 40 MG PO TBEC
40.0000 mg | DELAYED_RELEASE_TABLET | Freq: Every evening | ORAL | Status: DC
  Administered 2016-12-03 – 2016-12-05 (×3): 40 mg via ORAL
  Filled 2016-12-03 (×3): qty 1

## 2016-12-03 MED ORDER — BECLOMETHASONE DIPROPIONATE 80 MCG/ACT IN AERS: 2.0000 | INHALATION_SPRAY | Freq: Two times a day (BID) | RESPIRATORY_TRACT | Status: DC

## 2016-12-03 MED ORDER — CARVEDILOL 12.5 MG PO TABS
25.0000 mg | ORAL_TABLET | Freq: Two times a day (BID) | ORAL | Status: DC
  Administered 2016-12-03 – 2016-12-06 (×7): 25 mg via ORAL
  Filled 2016-12-03 (×8): qty 2

## 2016-12-03 MED ORDER — FERROUS GLUCONATE 324 (38 FE) MG PO TABS
324.0000 mg | ORAL_TABLET | Freq: Two times a day (BID) | ORAL | Status: DC
  Administered 2016-12-03 – 2016-12-06 (×7): 324 mg via ORAL
  Filled 2016-12-03 (×9): qty 1

## 2016-12-03 NOTE — ED Notes (Signed)
Pt states that he is breathing better now, update given, pt pending admission to hospital

## 2016-12-03 NOTE — Progress Notes (Signed)
Patient seen and examined. Database reviewed. Admitted earlier today due to LE edema. Well known for frequent hospitalizations due to systolic CHF with known medication and dietary non-compliance. Continue lasix, strive for negative fluid balance. Will continue to follow.  Peggye Pitt, MD [facility] Pager: [phone]

## 2016-12-04 LAB — BASIC METABOLIC PANEL
ANION GAP: 8 (ref 5–15)
BUN: 17 mg/dL (ref 6–20)
CHLORIDE: 100 mmol/L — AB (ref 101–111)
CO2: 33 mmol/L — ABNORMAL HIGH (ref 22–32)
Calcium: 9 mg/dL (ref 8.9–10.3)
Creatinine, Ser: 1.19 mg/dL (ref 0.61–1.24)
Glucose, Bld: 88 mg/dL (ref 65–99)
POTASSIUM: 3.5 mmol/L (ref 3.5–5.1)
SODIUM: 141 mmol/L (ref 135–145)

## 2016-12-04 MED ORDER — FUROSEMIDE 10 MG/ML IJ SOLN
80.0000 mg | Freq: Two times a day (BID) | INTRAMUSCULAR | Status: DC
  Administered 2016-12-04 – 2016-12-05 (×2): 80 mg via INTRAVENOUS
  Filled 2016-12-04 (×2): qty 8

## 2016-12-04 NOTE — Care Management Important Message (Signed)
Important Message  Patient Details  Name: [name] MRN: [id number] Date of Birth: [birth year]   Medicare Important Message Given:  Yes    Amiliana Foutz, [name], RN 12/04/2016, 10:41 AM

## 2016-12-04 NOTE — Progress Notes (Signed)
PROGRESS NOTE    [name]  ZOX:[id number] DOB: [birth year] DOA: 12/02/2016 PCP: Pearson Grippe, MD     Brief Narrative:  51 y/o man admitted from home on 6/14. Well known for frequent hospitalizations due to acute on chronic systolic CHF secondary to diet and medication non-compliance. Admitted for same   Assessment & Plan:   Principal Problem:   Acute on chronic systolic CHF (congestive heart failure) (HCC) Active Problems:   OBESITY, MORBID   Hypertension   GERD (gastroesophageal reflux disease)   Hypokalemia   Pulmonary hypertension (HCC)   OSA (obstructive sleep apnea)   Nonischemic cardiomyopathy (HCC)   CKD (chronic kidney disease), stage II   Pedal edema   Acute on Chronic Systolic CHF -Has diuresed close to 9 L. -Will transition off lasix drip today and place on lasix 80 BID. Unfortunately, we know he will not take any diuretics at home so will try and diurese as much as possible prior to DC to try and keep him out of the hospital longer. -Still remains volume overloaded on exam.  HTN -Well controlled. -Continue current management.  CKD Stage II -Cr remains at baseline of around 1-1.1.   DVT prophylaxis: xarelto Code Status: full code Family Communication: patient only Disposition Plan: anticipate DC home in 48-72 hours.  Consultants:   None  Procedures:   None  Antimicrobials:  Anti-infectives    None       Subjective: Lying in bed, no complaints  Objective: Vitals:   12/04/16 0226 12/04/16 0605 12/04/16 0745 12/04/16 1501  BP: 136/82 (!) 141/85  124/87  Pulse: 75 89  60  Resp: 20 20  18   Temp: 98.3 F (36.8 C) 98.2 F (36.8 C)  98.4 F (36.9 C)  TempSrc: Oral Oral  Oral  SpO2: 98% 98% 96% 100%  Weight:  (!) 136.2 kg (300 lb 3.2 oz)    Height:        Intake/Output Summary (Last 24 hours) at 12/04/16 1705 Last data filed at 12/04/16 1500  Gross per 24 hour  Intake           660.93 ml  Output             5050 ml  Net          -4389.07 ml   Filed Weights   12/02/16 2050 12/03/16 0056 12/04/16 0605  Weight: (!) 143.3 kg (316 lb) (!) 144.1 kg (317 lb 11.2 oz) (!) 136.2 kg (300 lb 3.2 oz)    Examination:  General exam: Alert, awake, oriented x 3 Respiratory system: mild bibasilar crackles Cardiovascular system:RRR. No murmurs, rubs, gallops. Gastrointestinal system: Abdomen is nondistended, soft and nontender. No organomegaly or masses felt. Normal bowel sounds heard. Central nervous system: Alert and oriented. No focal neurological deficits. Extremities: 3+ pitting edema bilaterally Skin: No rashes, lesions or ulcers Psychiatry: Poor judgement and insight appearl. Mood & affect appropriate.     Data Reviewed: I have personally reviewed following labs and imaging studies  CBC:  Recent Labs Lab 12/02/16 2055 12/03/16 0547  WBC 4.2 3.8*  NEUTROABS 2.6  --   HGB 10.0* 10.6*  HCT 33.1* 34.7*  MCV 80.5 80.1  PLT 199 206   Basic Metabolic Panel:  Recent Labs Lab 12/02/16 2055 12/03/16 0547 12/04/16 0610  NA 137 141 141  K 3.3* 3.4* 3.5  CL 103 102 100*  CO2 25 30 33*  GLUCOSE 99 93 88  BUN 17 16 17   CREATININE 1.04 1.05 1.19  CALCIUM 8.7* 8.9 9.0   GFR: Estimated Creatinine Clearance: 108.5 mL/min (by C-G formula based on SCr of 1.19 mg/dL). Liver Function Tests:  Recent Labs Lab 12/02/16 2055  AST 26  ALT 21  ALKPHOS 96  BILITOT 1.4*  PROT 7.0  ALBUMIN 3.7   No results for input(s): LIPASE, AMYLASE in the last 168 hours. No results for input(s): AMMONIA in the last 168 hours. Coagulation Profile: No results for input(s): INR, PROTIME in the last 168 hours. Cardiac Enzymes:  Recent Labs Lab 12/02/16 2055  TROPONINI 0.03*   BNP (last 3 results) No results for input(s): PROBNP in the last 8760 hours. HbA1C: No results for input(s): HGBA1C in the last 72 hours. CBG: No results for input(s): GLUCAP in the last 168 hours. Lipid Profile: No results for input(s): CHOL,  HDL, LDLCALC, TRIG, CHOLHDL, LDLDIRECT in the last 72 hours. Thyroid Function Tests:  Recent Labs  12/02/16 2055  TSH 2.954   Anemia Panel: No results for input(s): VITAMINB12, FOLATE, FERRITIN, TIBC, IRON, RETICCTPCT in the last 72 hours. Urine analysis:    Component Value Date/Time   COLORURINE AMBER (A) 10/24/2016 0923   APPEARANCEUR CLEAR 10/24/2016 0923   LABSPEC 1.027 10/24/2016 0923   PHURINE 5.0 10/24/2016 0923   GLUCOSEU NEGATIVE 10/24/2016 0923   GLUCOSEU NEG mg/dL 92/44/6286 3817   HGBUR NEGATIVE 10/24/2016 0923   BILIRUBINUR NEGATIVE 10/24/2016 0923   KETONESUR NEGATIVE 10/24/2016 0923   PROTEINUR 100 (A) 10/24/2016 0923   UROBILINOGEN 0.2 08/08/2014 1445   NITRITE NEGATIVE 10/24/2016 0923   LEUKOCYTESUR NEGATIVE 10/24/2016 0923   Sepsis Labs: @LABRCNTIP (procalcitonin:4,lacticidven:4)  )No results found for this or any previous visit (from the past 240 hour(s)).       Radiology Studies: Dg Chest Portable 1 View  Result Date: 12/02/2016 CLINICAL DATA:  Acute shortness of breath and cough. EXAM: PORTABLE CHEST 1 VIEW COMPARISON:  11/25/2016 and prior exams FINDINGS: Cardiomegaly and pulmonary vascular congestion again noted. There is no evidence of focal airspace disease, pulmonary edema, suspicious pulmonary nodule/mass, pleural effusion, or pneumothorax. No acute bony abnormalities are identified. IMPRESSION: Cardiomegaly and pulmonary vascular congestion. Electronically Signed   By: Harmon Pier M.D.   On: 12/02/2016 21:26        Scheduled Meds: . atorvastatin  40 mg Oral q1800  . budesonide (PULMICORT) nebulizer solution  0.25 mg Nebulization BID  . carvedilol  25 mg Oral BID WC  . ferrous gluconate  324 mg Oral BID WC  . lisinopril  5 mg Oral Daily  . pantoprazole  40 mg Oral QPM  . potassium chloride SA  40 mEq Oral Daily  . rivaroxaban  20 mg Oral Q supper  . tamsulosin  0.4 mg Oral Daily   Continuous Infusions: . furosemide (LASIX) infusion 8  mg/hr (12/04/16 1237)     LOS: 2 days    Time spent: 25 minutes. Greater than 50% of this time was spent in direct contact with the patient coordinating care.     Chaya Jan, MD [facility] Pager [phone]  If 7PM-7AM, please contact night-coverage [email or web address] Password [facility] 12/04/2016, 5:05 PM

## 2016-12-04 NOTE — Care Management Note (Signed)
Case Management Note  Patient Details  Name: [name] MRN: [id number] Date of Birth: 1965/11/04  Subjective/Objective:   Pt is from home, admitted with CHF. Pt is currently on a lasix gtt. Pt lives with mother and is ind with ADL's. Pt is non-compliant with his diet and medications.  Pt has home oxygen PTA. He has been discharged from multiple home care agencies and community resources due to chronic non-compliance.                Action/Plan:Anticipate DC home with self care. CM following.    Expected Discharge Date:      12/07/2016            Expected Discharge Plan:  Home/Self Care  In-House Referral:     Discharge planning Services  CM Consult  Post Acute Care Choice:    Choice offered to:  NA  DME Arranged:    DME Agency:     HH Arranged:    HH Agency:     Status of Service:  Completed, signed off  If discussed at [employer] of Stay Meetings, dates discussed:    Additional Comments:  Jelesa Mangini, [name], RN 12/04/2016, 1:17 PM

## 2016-12-05 LAB — BASIC METABOLIC PANEL
Anion gap: 8 (ref 5–15)
BUN: 20 mg/dL (ref 6–20)
CALCIUM: 8.6 mg/dL — AB (ref 8.9–10.3)
CHLORIDE: 100 mmol/L — AB (ref 101–111)
CO2: 30 mmol/L (ref 22–32)
CREATININE: 1.28 mg/dL — AB (ref 0.61–1.24)
GFR calc non Af Amer: >60 mL/min (ref >60)
Glucose, Bld: 94 mg/dL (ref 65–99)
Potassium: 3.2 mmol/L — ABNORMAL LOW (ref 3.5–5.1)
Sodium: 138 mmol/L (ref 135–145)

## 2016-12-05 MED ORDER — FUROSEMIDE 80 MG PO TABS
80.0000 mg | ORAL_TABLET | Freq: Two times a day (BID) | ORAL | Status: DC
  Administered 2016-12-05 – 2016-12-06 (×2): 80 mg via ORAL
  Filled 2016-12-05 (×2): qty 1

## 2016-12-05 NOTE — Progress Notes (Signed)
Central Telemetry called this nurse at [phone] stating patient has been off of telemetry for an hour. This is the first this nurse was called about this patient being off telemetry.

## 2016-12-05 NOTE — Progress Notes (Signed)
PROGRESS NOTE    [name]  VWU:[id number] DOB: 1966/02/13 DOA: 12/02/2016 PCP: Pearson Grippe, MD     Brief Narrative:  51 y/o man admitted from home on 6/14. Well known for frequent hospitalizations due to acute on chronic systolic CHF secondary to diet and medication non-compliance. Admitted for same   Assessment & Plan:   Principal Problem:   Acute on chronic systolic CHF (congestive heart failure) (HCC) Active Problems:   OBESITY, MORBID   Hypertension   GERD (gastroesophageal reflux disease)   Hypokalemia   Pulmonary hypertension (HCC)   OSA (obstructive sleep apnea)   Nonischemic cardiomyopathy (HCC)   CKD (chronic kidney disease), stage II   Pedal edema   Acute on Chronic Systolic CHF -Has diuresed 10.6 L. -Will transition lasix over to PO today. -Consider DC home in am as long as continues to diurese and no major alterations in renal function.  HTN -Well controlled. -Continue current management.  CKD Stage II -Cr remains at baseline of around 1-1.1.   DVT prophylaxis: xarelto Code Status: full code Family Communication: patient only Disposition Plan: anticipate DC home in 24-48 hours.  Consultants:   None  Procedures:   None  Antimicrobials:  Anti-infectives    None       Subjective: Lying in bed, no complaints  Objective: Vitals:   12/04/16 2100 12/05/16 0549 12/05/16 0720 12/05/16 1410  BP: 124/74 134/84  109/68  Pulse: 70 66  64  Resp: 18 18  18   Temp: 97.5 F (36.4 C) 98.2 F (36.8 C)    TempSrc: Oral Oral    SpO2: 100% 99% 99% 100%  Weight:  134.2 kg (295 lb 14.4 oz)    Height:        Intake/Output Summary (Last 24 hours) at 12/05/16 1545 Last data filed at 12/05/16 0840  Gross per 24 hour  Intake             1900 ml  Output             2875 ml  Net             -975 ml   Filed Weights   12/03/16 0056 12/04/16 0605 12/05/16 0549  Weight: (!) 144.1 kg (317 lb 11.2 oz) (!) 136.2 kg (300 lb 3.2 oz) 134.2 kg (295 lb 14.4  oz)    Examination:  General exam: Alert, awake, oriented x 3 Respiratory system: mild bibasilar crackles Cardiovascular system:RRR. No murmurs, rubs, gallops. Gastrointestinal system: Abdomen is nondistended, soft and nontender. No organomegaly or masses felt. Normal bowel sounds heard. Central nervous system: Alert and oriented. No focal neurological deficits. Extremities: 2+ pitting edema bilaterally Skin: No rashes, lesions or ulcers Psychiatry: Poor judgement and insight appearl. Mood & affect appropriate.     Data Reviewed: I have personally reviewed following labs and imaging studies  CBC:  Recent Labs Lab 12/02/16 2055 12/03/16 0547  WBC 4.2 3.8*  NEUTROABS 2.6  --   HGB 10.0* 10.6*  HCT 33.1* 34.7*  MCV 80.5 80.1  PLT 199 206   Basic Metabolic Panel:  Recent Labs Lab 12/02/16 2055 12/03/16 0547 12/04/16 0610 12/05/16 0511  NA 137 141 141 138  K 3.3* 3.4* 3.5 3.2*  CL 103 102 100* 100*  CO2 25 30 33* 30  GLUCOSE 99 93 88 94  BUN 17 16 17 20   CREATININE 1.04 1.05 1.19 1.28*  CALCIUM 8.7* 8.9 9.0 8.6*   GFR: Estimated Creatinine Clearance: 100 mL/min (A) (by C-G  formula based on SCr of 1.28 mg/dL (H)). Liver Function Tests:  Recent Labs Lab 12/02/16 2055  AST 26  ALT 21  ALKPHOS 96  BILITOT 1.4*  PROT 7.0  ALBUMIN 3.7   No results for input(s): LIPASE, AMYLASE in the last 168 hours. No results for input(s): AMMONIA in the last 168 hours. Coagulation Profile: No results for input(s): INR, PROTIME in the last 168 hours. Cardiac Enzymes:  Recent Labs Lab 12/02/16 2055  TROPONINI 0.03*   BNP (last 3 results) No results for input(s): PROBNP in the last 8760 hours. HbA1C: No results for input(s): HGBA1C in the last 72 hours. CBG: No results for input(s): GLUCAP in the last 168 hours. Lipid Profile: No results for input(s): CHOL, HDL, LDLCALC, TRIG, CHOLHDL, LDLDIRECT in the last 72 hours. Thyroid Function Tests:  Recent Labs   12/02/16 2055  TSH 2.954   Anemia Panel: No results for input(s): VITAMINB12, FOLATE, FERRITIN, TIBC, IRON, RETICCTPCT in the last 72 hours. Urine analysis:    Component Value Date/Time   COLORURINE AMBER (A) 10/24/2016 0923   APPEARANCEUR CLEAR 10/24/2016 0923   LABSPEC 1.027 10/24/2016 0923   PHURINE 5.0 10/24/2016 0923   GLUCOSEU NEGATIVE 10/24/2016 0923   GLUCOSEU NEG mg/dL 63/33/5456 2563   HGBUR NEGATIVE 10/24/2016 0923   BILIRUBINUR NEGATIVE 10/24/2016 0923   KETONESUR NEGATIVE 10/24/2016 0923   PROTEINUR 100 (A) 10/24/2016 0923   UROBILINOGEN 0.2 08/08/2014 1445   NITRITE NEGATIVE 10/24/2016 0923   LEUKOCYTESUR NEGATIVE 10/24/2016 0923   Sepsis Labs: @LABRCNTIP (procalcitonin:4,lacticidven:4)  )No results found for this or any previous visit (from the past 240 hour(s)).       Radiology Studies: No results found.      Scheduled Meds: . atorvastatin  40 mg Oral q1800  . budesonide (PULMICORT) nebulizer solution  0.25 mg Nebulization BID  . carvedilol  25 mg Oral BID WC  . ferrous gluconate  324 mg Oral BID WC  . furosemide  80 mg Intravenous BID  . lisinopril  5 mg Oral Daily  . pantoprazole  40 mg Oral QPM  . potassium chloride SA  40 mEq Oral Daily  . rivaroxaban  20 mg Oral Q supper  . tamsulosin  0.4 mg Oral Daily   Continuous Infusions:    LOS: 3 days    Time spent: 25 minutes. Greater than 50% of this time was spent in direct contact with the patient coordinating care.     Chaya Jan, MD [facility] Pager [phone]  If 7PM-7AM, please contact night-coverage [email or web address] Password [facility] 12/05/2016, 3:45 PM

## 2016-12-05 NOTE — Progress Notes (Signed)
Central telemetry informed this nurse, patient had five run of V-tach, patient stable, no chest pain, patient states he was up moving around washing up.

## 2016-12-06 LAB — BASIC METABOLIC PANEL
Anion gap: 9 (ref 5–15)
BUN: 21 mg/dL — AB (ref 6–20)
CALCIUM: 8.8 mg/dL — AB (ref 8.9–10.3)
CO2: 30 mmol/L (ref 22–32)
CREATININE: 1.24 mg/dL (ref 0.61–1.24)
Chloride: 100 mmol/L — ABNORMAL LOW (ref 101–111)
Glucose, Bld: 86 mg/dL (ref 65–99)
Potassium: 3.7 mmol/L (ref 3.5–5.1)
SODIUM: 139 mmol/L (ref 135–145)

## 2016-12-06 IMAGING — DX DG CHEST 2V
2 series · 2 of 2 positions shown · non-contrast
Comparison: August 08, 2014

CLINICAL DATA: Shortness of breath and cough for 2 weeks

EXAM:
CHEST  2 VIEW

[chest pa]
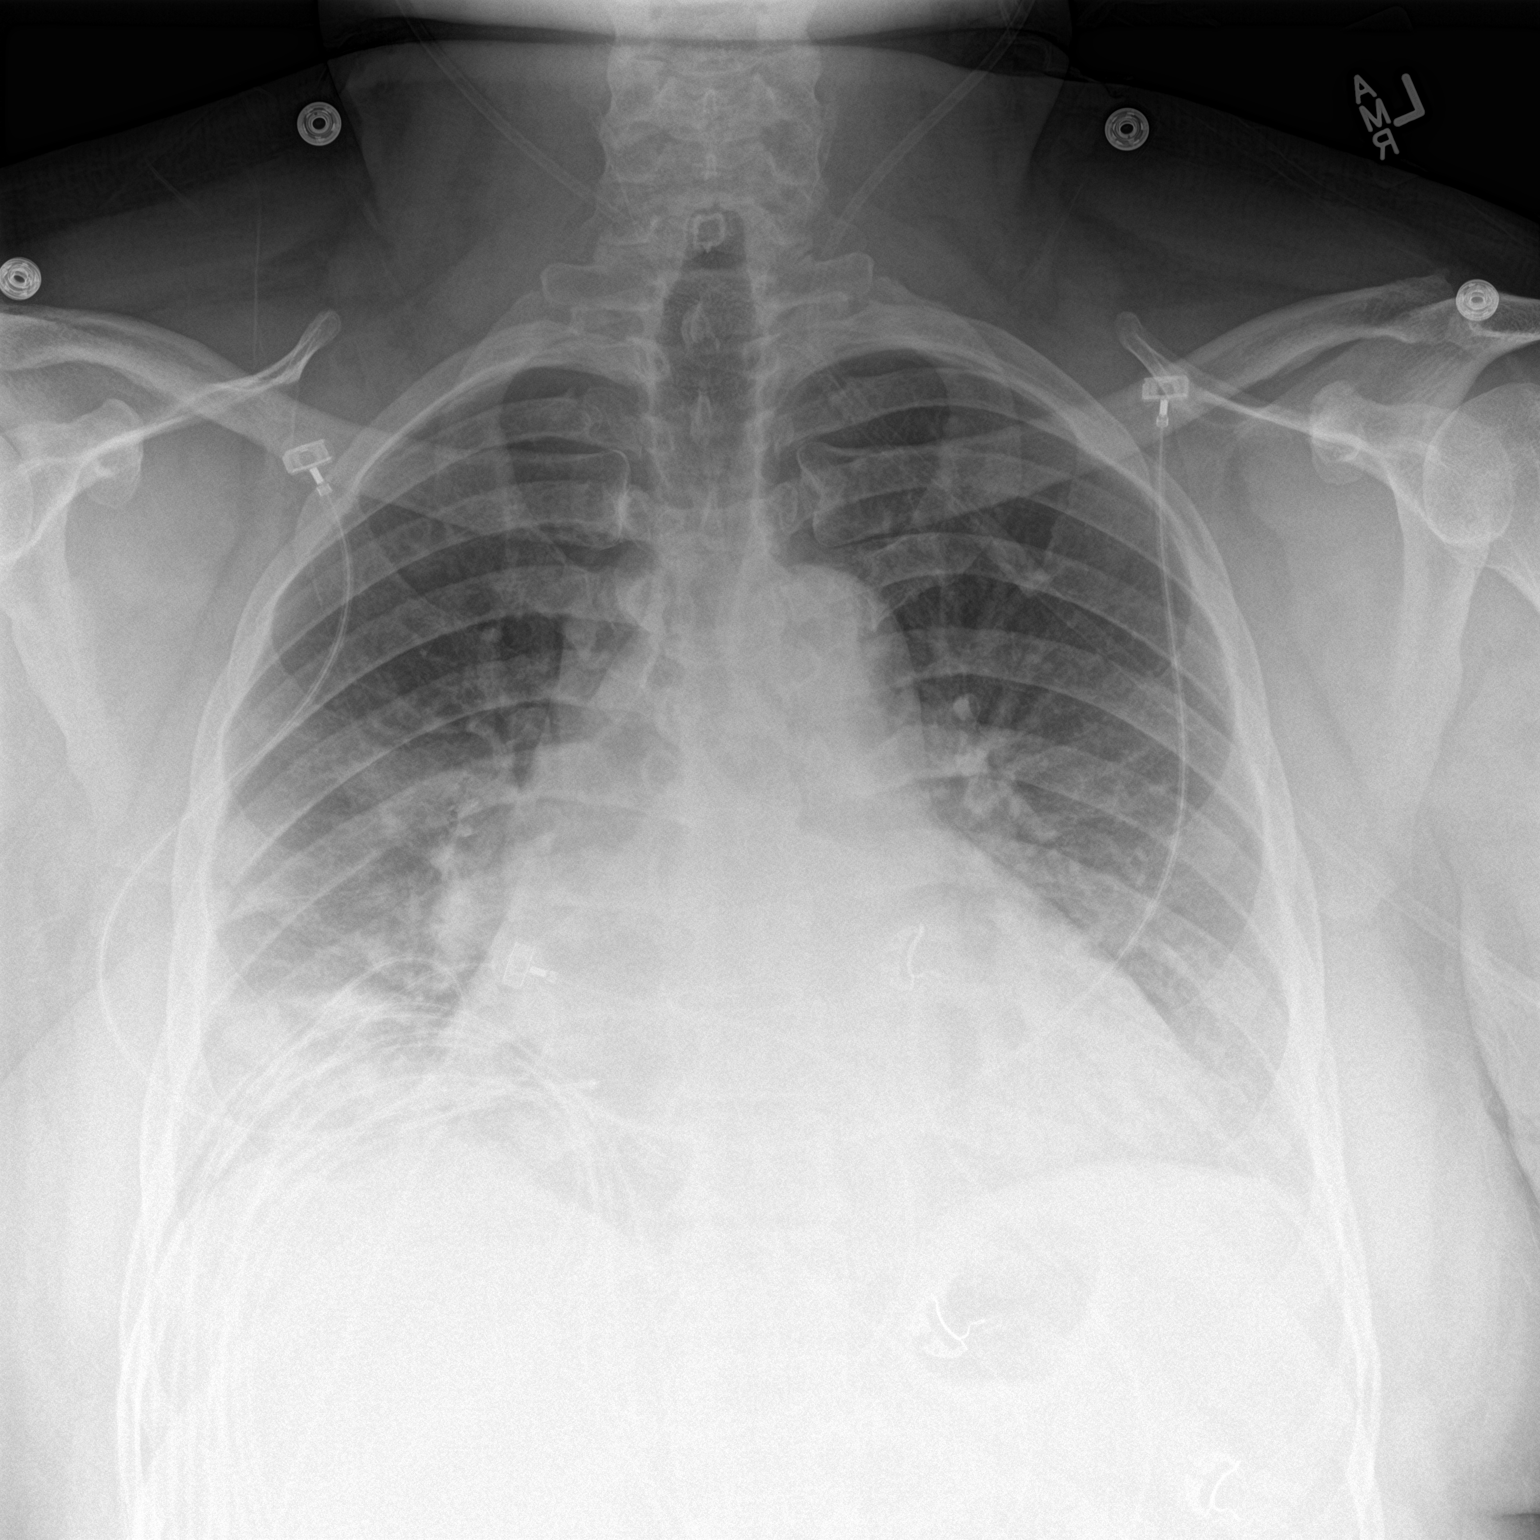

[chest lat]
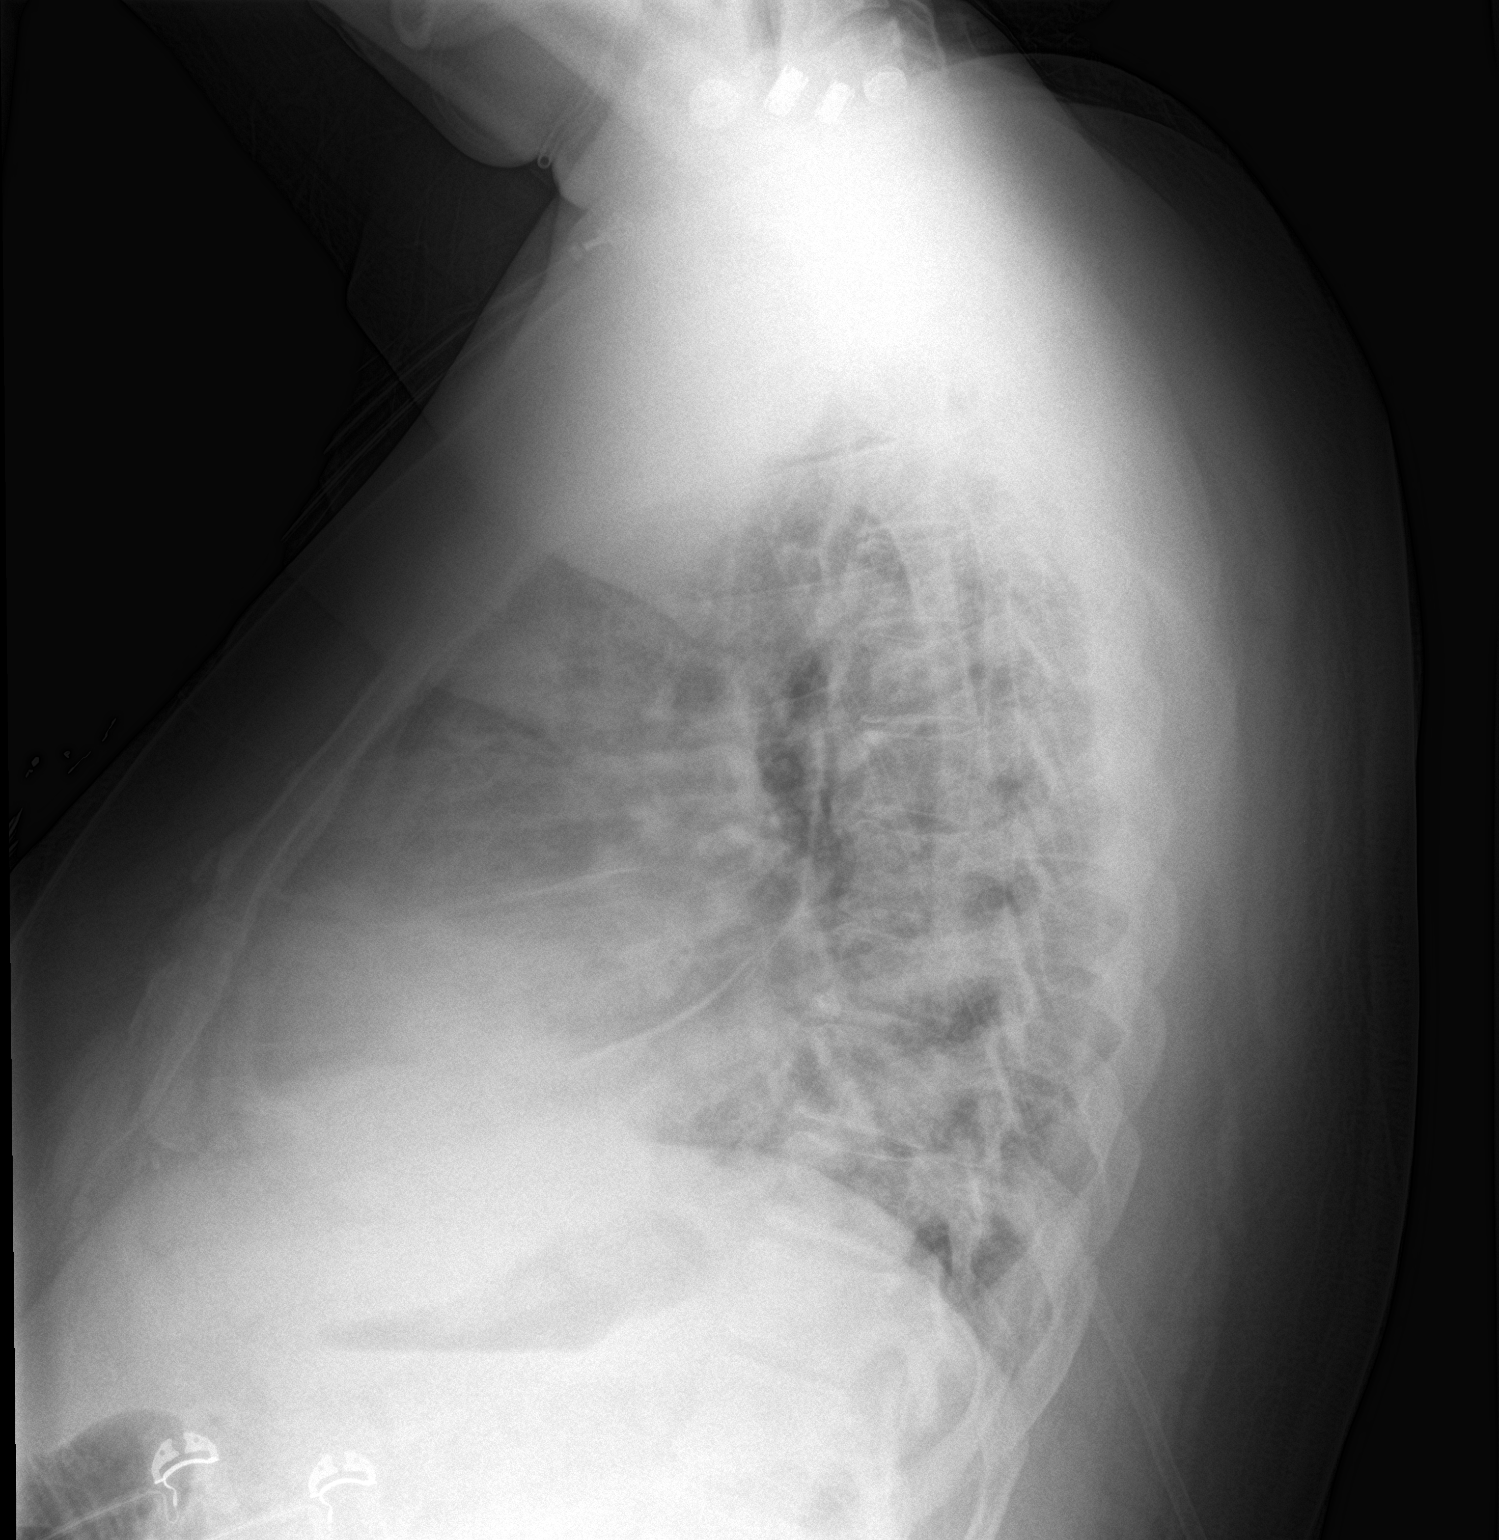

[2 of 2 positions shown; findings below may reference images not displayed]

FINDINGS: There is airspace consolidation in portions of the right middle and
lower lobes. Lungs elsewhere clear. Heart is mildly enlarged with
pulmonary vascularity within normal limits. No adenopathy. No bone
lesions.
IMPRESSION: Airspace consolidation in portions the right middle and lower lobes,
most likely due to pneumonia. Lungs elsewhere clear. There is mild
cardiomegaly. Followup PA and lateral chest radiographs recommended
in 3-4 weeks following trial of antibiotic therapy to ensure
resolution and exclude underlying malignancy.

## 2016-12-06 MED ORDER — FUROSEMIDE 80 MG PO TABS: 80.0000 mg | ORAL_TABLET | Freq: Two times a day (BID) | ORAL | 1 refills | Status: DC

## 2016-12-06 NOTE — Discharge Summary (Signed)
Physician Discharge Summary  [name]:[id number] DOB: August 24, 1965 DOA: 12/02/2016  PCP: Pearson Grippe, MD  Admit date: 12/02/2016 Discharge date: 12/06/2016  Time spent: 45 minutes  Recommendations for Outpatient Follow-up:  -Will be discharged home today. -Advised to follow up with PCP in 2 weeks. -Counseled on importance of medication and diet adherence.   Discharge Diagnoses:  Principal Problem:   Acute on chronic systolic CHF (congestive heart failure) (HCC) Active Problems:   OBESITY, MORBID   Hypertension   GERD (gastroesophageal reflux disease)   Hypokalemia   Pulmonary hypertension (HCC)   OSA (obstructive sleep apnea)   Nonischemic cardiomyopathy (HCC)   CKD (chronic kidney disease), stage II   Pedal edema   Discharge Condition: Stable and improved  Filed Weights   12/04/16 0605 12/05/16 0549 12/06/16 0511  Weight: (!) 136.2 kg (300 lb 3.2 oz) 134.2 kg (295 lb 14.4 oz) 132.5 kg (292 lb 1.6 oz)    History of present illness:  As per Dr. Conley Rolls on 6/13: AVIRAJ Fitzpatrick is an 51 y.o. male with frequent hospitalization for CHF, leg swelling and fluid retention, severe non compliant with meds and diet, being terminated from several cardiology groups, hx of afib on Xarelto, HTN, MI ashtma, OSA, presented to the ER with swelling of the lower legs, unable to stand on it, and having SOB.  He has no CP, fever, chills, orthopnea, or coughs.  Evalution in the ER showed CXR with vascular congestion but no infiltrates, BNP same as baseline at 700's, and normal Cr with K of 3.1.  He has no leukocytosis and serology was otherwise unremarkable.   Hospitalist was asked to admit him for diuresis.  Currently, he is on oral bumex.   Hospital Course:   Acute on Chronic Systolic CHF -Has diuresed 12 L since admission. -Has been converted over to PO lasix. He refuses to take diuretics as an OP because "they make my legs swell".  HTN -Well controlled. -Continue current  management.  CKD Stage II -Cr remains at baseline of around 1-1.1.  Procedures:  None   Consultations:  None  Discharge Instructions  Discharge Instructions    Diet - low sodium heart healthy    Complete by:  As directed    Increase activity slowly    Complete by:  As directed      Allergies as of 12/06/2016      Reactions   Bee Venom Shortness Of Breath, Swelling, Other (See Comments)   Reaction:  Facial swelling   Food Shortness Of Breath, Rash, Other (See Comments)   Pt states that he is allergic to strawberries.     Lipitor [atorvastatin] Shortness Of Breath, Other (See Comments)   Reaction:  Nose bleeds    Aspirin Other (See Comments)   Reaction:  GI upset    Banana Diarrhea   Metolazone Other (See Comments)   Pt states that he stopped taking this med due to heart attack like symptoms.    Oatmeal Nausea And Vomiting   Orange Juice [orange Oil] Nausea And Vomiting   Torsemide Swelling, Other (See Comments)   Reaction:  Swelling of feet/legs    Diltiazem Palpitations   Hydralazine Palpitations      Medication List    TAKE these medications   albuterol (2.5 MG/3ML) 0.083% nebulizer solution Commonly known as:  PROVENTIL Take 2.5 mg by nebulization every 6 (six) hours as needed for wheezing or shortness of breath.   atorvastatin 40 MG tablet Commonly known  as:  LIPITOR Take 1 tablet (40 mg total) by mouth daily at 6 PM.   beclomethasone 80 MCG/ACT inhaler Commonly known as:  QVAR Inhale 2 puffs into the lungs 2 (two) times daily.   bumetanide 2 MG tablet Commonly known as:  BUMEX Take 1 tablet (2 mg total) by mouth 2 (two) times daily.   carvedilol 25 MG tablet Commonly known as:  COREG Take 1 tablet (25 mg total) by mouth 2 (two) times daily with a meal.   docusate sodium 100 MG capsule Commonly known as:  COLACE Take 1 capsule (100 mg total) by mouth 2 (two) times daily. What changed:  when to take this  reasons to take this   ferrous  gluconate 324 MG tablet Commonly known as:  FERGON Take 1 tablet (324 mg total) by mouth 2 (two) times daily with a meal. What changed:  when to take this   furosemide 80 MG tablet Commonly known as:  LASIX Take 1 tablet (80 mg total) by mouth 2 (two) times daily.   lisinopril 5 MG tablet Commonly known as:  PRINIVIL,ZESTRIL Take 1 tablet (5 mg total) by mouth daily.   pantoprazole 40 MG tablet Commonly known as:  PROTONIX Take 1 tablet (40 mg total) by mouth daily at 6 (six) AM. What changed:  when to take this   potassium chloride SA 20 MEQ tablet Commonly known as:  K-DUR,KLOR-CON Take 2 tablets (40 mEq total) by mouth daily. What changed:  how much to take   rivaroxaban 20 MG Tabs tablet Commonly known as:  XARELTO Take 1 tablet (20 mg total) by mouth daily with breakfast. What changed:  when to take this   spironolactone 50 MG tablet Commonly known as:  ALDACTONE Take 1 tablet (50 mg total) by mouth daily.   tamsulosin 0.4 MG Caps capsule Commonly known as:  FLOMAX Take 1 capsule (0.4 mg total) by mouth daily.      Allergies  Allergen Reactions  . Bee Venom Shortness Of Breath, Swelling and Other (See Comments)    Reaction:  Facial swelling  . Food Shortness Of Breath, Rash and Other (See Comments)    Pt states that he is allergic to strawberries.    . Lipitor [Atorvastatin] Shortness Of Breath and Other (See Comments)    Reaction:  Nose bleeds   . Aspirin Other (See Comments)    Reaction:  GI upset   . Banana Diarrhea  . Metolazone Other (See Comments)    Pt states that he stopped taking this med due to heart attack like symptoms.   . Oatmeal Nausea And Vomiting  . Orange Juice [Orange Oil] Nausea And Vomiting  . Torsemide Swelling and Other (See Comments)    Reaction:  Swelling of feet/legs   . Diltiazem Palpitations  . Hydralazine Palpitations   Follow-up Information    Pearson Grippe, MD. Schedule an appointment as soon as possible for a visit in 2  week(s).   Specialty:  Internal Medicine Contact information: [address] [place] [address] [phone]            The results of significant diagnostics from this hospitalization (including imaging, microbiology, ancillary and laboratory) are listed below for reference.    Significant Diagnostic Studies: Dg Chest 2 View  Result Date: 11/09/2016 CLINICAL DATA:  Shortness of breath. Bilateral lower extremity swelling. EXAM: CHEST  2 VIEW COMPARISON:  10/24/2016 FINDINGS: There is cardiomegaly with slight pulmonary vascular congestion. No discrete pulmonary edema  or effusions. The bones are normal. IMPRESSION: Cardiomegaly with slight pulmonary vascular congestion. Electronically Signed   By: Francene Boyers M.D.   On: 11/09/2016 08:32   US Venous Img Lower Unilateral Right  Result Date: 11/12/2016 CLINICAL DATA:  Right lower extremity pain and edema for 8 years. Right leg pain. EXAM: RIGHT LOWER EXTREMITY VENOUS DOPPLER ULTRASOUND TECHNIQUE: Gray-scale sonography with graded compression, as well as color Doppler and duplex ultrasound were performed to evaluate the lower extremity deep venous systems from the level of the common femoral vein and including the common femoral, femoral, profunda femoral, popliteal and calf veins including the posterior tibial, peroneal and gastrocnemius veins when visible. The superficial great saphenous vein was also interrogated. Spectral Doppler was utilized to evaluate flow at rest and with distal augmentation maneuvers in the common femoral, femoral and popliteal veins. COMPARISON:  None. FINDINGS: Visualization of calf veins is limited due to patient anatomy. Contralateral Common Femoral Vein: Respiratory phasicity is normal and symmetric with the symptomatic side. No evidence of thrombus. Normal compressibility. Common Femoral Vein: No evidence of thrombus. Saphenofemoral Junction: No evidence of thrombus. Profunda Femoral Vein: No  evidence of thrombus. Femoral Vein: No evidence of thrombus. Popliteal Vein: No evidence of thrombus. Calf Veins: No evidence of thrombus. Superficial Great Saphenous Vein: No evidence of thrombus. Other Findings: There are numerous subcutaneous varicosities in the medial right calf with some sluggish blood flow but no thrombosis. IMPRESSION: 1. No evidence of DVT within the right lower extremity. 2. Extensive subcutaneous varicosities in the medial right calf. If considering varicose vein therapy, could obtain venous mapping in the IR clinic. Electronically Signed   By: Marnee Spring M.D.   On: 11/12/2016 16:13   Dg Chest Portable 1 View  Result Date: 12/02/2016 CLINICAL DATA:  Acute shortness of breath and cough. EXAM: PORTABLE CHEST 1 VIEW COMPARISON:  11/25/2016 and prior exams FINDINGS: Cardiomegaly and pulmonary vascular congestion again noted. There is no evidence of focal airspace disease, pulmonary edema, suspicious pulmonary nodule/mass, pleural effusion, or pneumothorax. No acute bony abnormalities are identified. IMPRESSION: Cardiomegaly and pulmonary vascular congestion. Electronically Signed   By: Harmon Pier M.D.   On: 12/02/2016 21:26    Microbiology: No results found for this or any previous visit (from the past 240 hour(s)).   Labs: Basic Metabolic Panel:  Recent Labs Lab 12/02/16 2055 12/03/16 0547 12/04/16 0610 12/05/16 0511 12/06/16 0527  NA 137 141 141 138 139  K 3.3* 3.4* 3.5 3.2* 3.7  CL 103 102 100* 100* 100*  CO2 25 30 33* 30 30  GLUCOSE 99 93 88 94 86  BUN 17 16 17 20  21*  CREATININE 1.04 1.05 1.19 1.28* 1.24  CALCIUM 8.7* 8.9 9.0 8.6* 8.8*   Liver Function Tests:  Recent Labs Lab 12/02/16 2055  AST 26  ALT 21  ALKPHOS 96  BILITOT 1.4*  PROT 7.0  ALBUMIN 3.7   No results for input(s): LIPASE, AMYLASE in the last 168 hours. No results for input(s): AMMONIA in the last 168 hours. CBC:  Recent Labs Lab 12/02/16 2055 12/03/16 0547  WBC 4.2  3.8*  NEUTROABS 2.6  --   HGB 10.0* 10.6*  HCT 33.1* 34.7*  MCV 80.5 80.1  PLT 199 206   Cardiac Enzymes:  Recent Labs Lab 12/02/16 2055  TROPONINI 0.03*   BNP: BNP (last 3 results)  Recent Labs  10/24/16 0923 11/09/16 0800 12/02/16 2055  BNP 947.0* 770.0* 703.0*    ProBNP (last 3 results) No  results for input(s): PROBNP in the last 8760 hours.  CBG: No results for input(s): GLUCAP in the last 168 hours.     SignedChaya Jan  [facility] Pager: [phone] 12/06/2016, 1:36 PM

## 2016-12-06 NOTE — Progress Notes (Signed)
IV removed, WNL. D/C instructions explained to pt. Verbalized understanding. Pt family member on the way to transport patient home.

## 2016-12-24 DIAGNOSIS — R0682 Tachypnea, not elsewhere classified: Secondary | ICD-10-CM | POA: Diagnosis not present

## 2016-12-24 DIAGNOSIS — I5033 Acute on chronic diastolic (congestive) heart failure: Secondary | ICD-10-CM | POA: Diagnosis not present

## 2016-12-24 DIAGNOSIS — R0602 Shortness of breath: Secondary | ICD-10-CM | POA: Diagnosis not present

## 2016-12-24 DIAGNOSIS — R079 Chest pain, unspecified: Secondary | ICD-10-CM | POA: Diagnosis not present

## 2016-12-25 DIAGNOSIS — I10 Essential (primary) hypertension: Secondary | ICD-10-CM | POA: Diagnosis not present

## 2016-12-25 DIAGNOSIS — Z87891 Personal history of nicotine dependence: Secondary | ICD-10-CM | POA: Diagnosis not present

## 2016-12-25 DIAGNOSIS — Z7901 Long term (current) use of anticoagulants: Secondary | ICD-10-CM | POA: Diagnosis not present

## 2016-12-25 DIAGNOSIS — Z888 Allergy status to other drugs, medicaments and biological substances status: Secondary | ICD-10-CM | POA: Diagnosis not present

## 2016-12-25 DIAGNOSIS — R0602 Shortness of breath: Secondary | ICD-10-CM | POA: Diagnosis not present

## 2016-12-25 DIAGNOSIS — Z79899 Other long term (current) drug therapy: Secondary | ICD-10-CM | POA: Diagnosis not present

## 2016-12-25 DIAGNOSIS — Z886 Allergy status to analgesic agent status: Secondary | ICD-10-CM | POA: Diagnosis not present

## 2016-12-25 DIAGNOSIS — R079 Chest pain, unspecified: Secondary | ICD-10-CM | POA: Diagnosis not present

## 2016-12-25 DIAGNOSIS — G4733 Obstructive sleep apnea (adult) (pediatric): Secondary | ICD-10-CM | POA: Diagnosis not present

## 2016-12-25 DIAGNOSIS — R609 Edema, unspecified: Secondary | ICD-10-CM | POA: Diagnosis not present

## 2016-12-25 DIAGNOSIS — I5033 Acute on chronic diastolic (congestive) heart failure: Secondary | ICD-10-CM | POA: Diagnosis not present

## 2016-12-25 DIAGNOSIS — I482 Chronic atrial fibrillation: Secondary | ICD-10-CM | POA: Diagnosis not present

## 2016-12-25 DIAGNOSIS — D649 Anemia, unspecified: Secondary | ICD-10-CM | POA: Diagnosis not present

## 2016-12-25 DIAGNOSIS — Z7951 Long term (current) use of inhaled steroids: Secondary | ICD-10-CM | POA: Diagnosis not present

## 2016-12-25 DIAGNOSIS — I428 Other cardiomyopathies: Secondary | ICD-10-CM | POA: Diagnosis not present

## 2017-01-03 ENCOUNTER — Encounter (HOSPITAL_COMMUNITY): Payer: Self-pay | Admitting: Emergency Medicine

## 2017-01-03 ENCOUNTER — Emergency Department (HOSPITAL_COMMUNITY)
Admission: EM | Admit: 2017-01-03 | Discharge: 2017-01-03 | Disposition: A | Discharge status: Home / Self Care | Payer: Medicare Other | Attending: Emergency Medicine | Admitting: Emergency Medicine

## 2017-01-03 ENCOUNTER — Emergency Department (HOSPITAL_COMMUNITY): Payer: Medicare Other

## 2017-01-03 DIAGNOSIS — Z886 Allergy status to analgesic agent status: Secondary | ICD-10-CM | POA: Insufficient documentation

## 2017-01-03 DIAGNOSIS — N182 Chronic kidney disease, stage 2 (mild): Secondary | ICD-10-CM | POA: Insufficient documentation

## 2017-01-03 DIAGNOSIS — R061 Stridor: Secondary | ICD-10-CM | POA: Diagnosis not present

## 2017-01-03 DIAGNOSIS — I4891 Unspecified atrial fibrillation: Secondary | ICD-10-CM | POA: Insufficient documentation

## 2017-01-03 DIAGNOSIS — R609 Edema, unspecified: Secondary | ICD-10-CM | POA: Diagnosis not present

## 2017-01-03 DIAGNOSIS — Z7901 Long term (current) use of anticoagulants: Secondary | ICD-10-CM | POA: Insufficient documentation

## 2017-01-03 DIAGNOSIS — I129 Hypertensive chronic kidney disease with stage 1 through stage 4 chronic kidney disease, or unspecified chronic kidney disease: Secondary | ICD-10-CM | POA: Diagnosis not present

## 2017-01-03 DIAGNOSIS — R0600 Dyspnea, unspecified: Secondary | ICD-10-CM | POA: Insufficient documentation

## 2017-01-03 DIAGNOSIS — I509 Heart failure, unspecified: Secondary | ICD-10-CM | POA: Insufficient documentation

## 2017-01-03 DIAGNOSIS — Z7982 Long term (current) use of aspirin: Secondary | ICD-10-CM | POA: Insufficient documentation

## 2017-01-03 DIAGNOSIS — Z87891 Personal history of nicotine dependence: Secondary | ICD-10-CM | POA: Diagnosis not present

## 2017-01-03 DIAGNOSIS — J45909 Unspecified asthma, uncomplicated: Secondary | ICD-10-CM | POA: Insufficient documentation

## 2017-01-03 DIAGNOSIS — Z79899 Other long term (current) drug therapy: Secondary | ICD-10-CM | POA: Diagnosis not present

## 2017-01-03 DIAGNOSIS — R079 Chest pain, unspecified: Secondary | ICD-10-CM | POA: Insufficient documentation

## 2017-01-03 DIAGNOSIS — R0789 Other chest pain: Secondary | ICD-10-CM | POA: Diagnosis not present

## 2017-01-03 DIAGNOSIS — R0602 Shortness of breath: Secondary | ICD-10-CM | POA: Diagnosis not present

## 2017-01-03 LAB — BASIC METABOLIC PANEL
ANION GAP: 12 (ref 5–15)
BUN: 18 mg/dL (ref 6–20)
CALCIUM: 9.3 mg/dL (ref 8.9–10.3)
CO2: 25 mmol/L (ref 22–32)
CREATININE: 1.15 mg/dL (ref 0.61–1.24)
Chloride: 108 mmol/L (ref 101–111)
Glucose, Bld: 102 mg/dL — ABNORMAL HIGH (ref 65–99)
Potassium: 3.4 mmol/L — ABNORMAL LOW (ref 3.5–5.1)
SODIUM: 145 mmol/L (ref 135–145)

## 2017-01-03 LAB — CBC
HCT: 35.3 % — ABNORMAL LOW (ref 39.0–52.0)
HEMOGLOBIN: 10.9 g/dL — AB (ref 13.0–17.0)
MCH: 25 pg — AB (ref 26.0–34.0)
MCHC: 30.9 g/dL (ref 30.0–36.0)
MCV: 81 fL (ref 78.0–100.0)
PLATELETS: 204 10*3/uL (ref 150–400)
RBC: 4.36 MIL/uL (ref 4.22–5.81)
RDW: 18.3 % — ABNORMAL HIGH (ref 11.5–15.5)
WBC: 4.2 10*3/uL (ref 4.0–10.5)

## 2017-01-03 LAB — TROPONIN I

## 2017-01-03 MED ORDER — FUROSEMIDE 10 MG/ML IJ SOLN
40.0000 mg | Freq: Once | INTRAMUSCULAR | Status: AC
  Administered 2017-01-03: 40 mg via INTRAVENOUS
  Filled 2017-01-03: qty 4

## 2017-01-03 NOTE — ED Notes (Signed)
Pt using urinal.

## 2017-01-03 NOTE — Discharge Instructions (Signed)
You can double your fluid medication for 2-3 days. Decrease salt. Elevate legs. Follow-up your primary care doctor early this week

## 2017-01-03 NOTE — ED Provider Notes (Signed)
[facility] DE[facility] Provider Note   CSN: [id number] Arrival date & time: 01/03/17  0735     History   Chief Complaint Chief Complaint  Patient presents with  . Shortness of Breath  . Chest Pain    HPI [name] is a 51 y.o. male.  Patient presents with increased dyspnea over the past several days. He has multiple medical problems including morbid obesity, cardiomyopathy, CAD, CHF, compliance issues. No frank substernal chest pain, diaphoresis, nausea. Severity of symptoms is moderate. Exertion makes symptoms worse.      Past Medical History:  Diagnosis Date  . Allergic rhinitis   . Asthma   . Atrial fibrillation (HCC)   . CHF (congestive heart failure) (HCC)   . Essential hypertension   . Gastroesophageal reflux disease   . Heart attack (HCC)   . History of cardiac catheterization    Normal coronaries December 2016  . Noncompliance    Major problem leading to declining health and recurrent hospitalization  . Nonischemic cardiomyopathy (HCC)    LVEF 20-25%  . OSA (obstructive sleep apnea)   . Osteoarthritis   . Peptic ulcer disease     Patient Active Problem List   Diagnosis Date Noted  . Pedal edema 12/02/2016  . Acute CHF (congestive heart failure) (HCC) 11/09/2016  . Acute on chronic systolic CHF (congestive heart failure) (HCC) 08/19/2016  . CKD (chronic kidney disease), stage II 05/17/2016  . Coronary arteries, normal Dec 2016 01/23/2016  . Chronic anticoagulation-Xarelto 01/23/2016  . NSVT (nonsustained ventricular tachycardia) (HCC) 01/23/2016  . Elevated troponin 12/21/2015  . Nonischemic cardiomyopathy (HCC)   . Morbid obesity due to excess calories (HCC)   . Noncompliance with diet and medication regimen 12/02/2015  . OSA (obstructive sleep apnea) 09/20/2015  . Pulmonary hypertension (HCC) 09/19/2015  . Normocytic anemia 09/19/2015  . Chronic atrial fibrillation (HCC)   . Dyspnea on exertion 05/28/2015  . Acute respiratory failure with  hypoxia (HCC) 04/01/2015  . Hypokalemia 04/01/2015  . Obesity hypoventilation syndrome (HCC) 08/08/2014  . GERD (gastroesophageal reflux disease) 08/08/2014  . Edema of right lower extremity 06/21/2014  . Fecal urgency 06/21/2014  . Asthma 02/11/2009  . Hyperlipidemia 07/12/2008  . OBESITY, MORBID 07/12/2008  . Anxiety state 07/12/2008  . DEPRESSION 07/12/2008  . Hypertension 07/12/2008  . ALLERGIC RHINITIS 07/12/2008  . Peptic ulcer 07/12/2008  . OSTEOARTHRITIS 07/12/2008    Past Surgical History:  Procedure Laterality Date  . CARDIAC CATHETERIZATION N/A 06/14/2015   Procedure: Right/Left Heart Cath and Coronary Angiography;  Surgeon: Runell Gess, MD;  Location: [facility] INVASIVE CV LAB;  Service: Cardiovascular;  Laterality: N/A;       Home Medications    Prior to Admission medications   Medication Sig Start Date End Date Taking? Authorizing Provider  albuterol (PROVENTIL) (2.5 MG/3ML) 0.083% nebulizer solution Take 2.5 mg by nebulization every 6 (six) hours as needed for wheezing or shortness of breath.   Yes [provider]  aspirin 81 MG chewable tablet Chew 81 mg by mouth daily.   Yes [provider]  atorvastatin (LIPITOR) 40 MG tablet Take 1 tablet (40 mg total) by mouth daily at 6 PM. 06/25/16  Yes Kathlen Mody, MD  beclomethasone (QVAR) 80 MCG/ACT inhaler Inhale 2 puffs into the lungs 2 (two) times daily.    Yes [provider]  bumetanide (BUMEX) 2 MG tablet Take 1 tablet (2 mg total) by mouth 2 (two) times daily. 11/13/16  Yes TatOnalee Hua, MD  carvedilol (COREG) 25  MG tablet Take 1 tablet (25 mg total) by mouth 2 (two) times daily with a meal. 11/13/16  Yes Tat, Onalee Hua, MD  docusate sodium (COLACE) 100 MG capsule Take 1 capsule (100 mg total) by mouth 2 (two) times daily. Patient taking differently: Take 100 mg by mouth 2 (two) times daily as needed for mild constipation.  06/25/16  Yes Kathlen Mody, MD  ferrous gluconate (FERGON) 324 MG tablet  Take 1 tablet (324 mg total) by mouth 2 (two) times daily with a meal. Patient taking differently: Take 324 mg by mouth daily with supper.  06/25/16  Yes Kathlen Mody, MD  furosemide (LASIX) 80 MG tablet Take 1 tablet (80 mg total) by mouth 2 (two) times daily. 12/06/16  Yes Philip Aspen, Limmie Patricia, MD  lisinopril (PRINIVIL,ZESTRIL) 5 MG tablet Take 1 tablet (5 mg total) by mouth daily. 11/13/16  Yes Tat, Onalee Hua, MD  pantoprazole (PROTONIX) 40 MG tablet Take 1 tablet (40 mg total) by mouth daily at 6 (six) AM. Patient taking differently: Take 40 mg by mouth every evening.  06/26/16  Yes Kathlen Mody, MD  potassium chloride SA (K-DUR,KLOR-CON) 20 MEQ tablet Take 2 tablets (40 mEq total) by mouth daily. Patient taking differently: Take 20-40 mEq by mouth daily.  10/27/16  Yes Filbert Schilder, MD  rivaroxaban (XARELTO) 20 MG TABS tablet Take 1 tablet (20 mg total) by mouth daily with breakfast. Patient taking differently: Take 20 mg by mouth daily with supper.  06/26/16  Yes Kathlen Mody, MD  spironolactone (ALDACTONE) 50 MG tablet Take 1 tablet (50 mg total) by mouth daily. 10/02/16  Yes Houston Siren, MD  tamsulosin (FLOMAX) 0.4 MG CAPS capsule Take 1 capsule (0.4 mg total) by mouth daily. 06/25/16  Yes Kathlen Mody, MD    Family History Family History  Problem Relation Age of Onset  . Stroke Father   . Heart attack Father   . Aneurysm Mother        Cerebral aneurysm  . Hypertension Sister   . Colon cancer Neg Hx   . Inflammatory bowel disease Neg Hx   . Liver disease Neg Hx     Social History Social History  Substance Use Topics  . Smoking status: Former Smoker    Packs/day: 0.50    Years: 20.00    Types: Cigarettes    Start date: 04/26/1988    Quit date: 06/23/2007  . Smokeless tobacco: Never Used     Comment: 1 ppd former smoker  . Alcohol use No     Comment: No etoh since 2009     Allergies   Bee venom; Food; Lipitor [atorvastatin]; Aspirin; Banana; Metolazone; Oatmeal; Orange  juice [orange oil]; Torsemide; Diltiazem; and Hydralazine   Review of Systems Review of Systems  All other systems reviewed and are negative.    Physical Exam Updated Vital Signs BP (!) 149/112 (BP Location: Left Arm)   Pulse (!) 43   Temp 97.7 F (36.5 C) (Oral)   Resp 18   Ht 6' (1.829 m)   Wt 136.1 kg (300 lb)   SpO2 97%   BMI 40.69 kg/m   Physical Exam  Constitutional: He is oriented to person, place, and time.  Alert, no obvious dyspnea  HENT:  Head: Normocephalic and atraumatic.  Eyes: Conjunctivae are normal.  Neck: Neck supple.  Cardiovascular: Normal rate and regular rhythm.   Pulmonary/Chest: Effort normal and breath sounds normal.  Abdominal: Soft. Bowel sounds are normal.  Musculoskeletal: Normal range of motion.  Neurological: He is alert and oriented to person, place, and time.  Skin:  3-4+ peripheral edema  Psychiatric: He has a normal mood and affect. His behavior is normal.  Nursing note and vitals reviewed.    ED Treatments / Results  Labs (all labs ordered are listed, but only abnormal results are displayed) Labs Reviewed  BASIC METABOLIC PANEL - Abnormal; Notable for the following:       Result Value   Potassium 3.4 (*)    Glucose, Bld 102 (*)    All other components within normal limits  CBC - Abnormal; Notable for the following:    Hemoglobin 10.9 (*)    HCT 35.3 (*)    MCH 25.0 (*)    RDW 18.3 (*)    All other components within normal limits  TROPONIN I    EKG  EKG Interpretation None       Date: 01/03/2017  Rate: 80  Rhythm: a fib  QRS Axis: normal  Intervals: normal  ST/T Wave abnormalities: normal  Conduction Disutrbances: none  Narrative Interpretation: unremarkable    Radiology Dg Chest 2 View  Result Date: 01/03/2017 CLINICAL DATA:  Chest pain and shortness of breath since yesterday. EXAM: CHEST  2 VIEW COMPARISON:  12/24/2016. FINDINGS: Stable enlarged cardiac silhouette and prominent pulmonary vasculature.  The interstitial markings are mildly prominent with improvement. No pleural fluid. Unremarkable bones. IMPRESSION: Cardiomegaly, pulmonary vascular congestion and mild chronic interstitial lung disease. Electronically Signed   By: Beckie Salts M.D.   On: 01/03/2017 08:28    Procedures Procedures (including critical care time)  Medications Ordered in ED Medications  furosemide (LASIX) injection 40 mg (40 mg Intravenous Given 01/03/17 0815)     Initial Impression / Assessment and Plan / ED Course  I have reviewed the triage vital signs and the nursing notes.  Pertinent labs & imaging results that were available during my care of the patient were reviewed by me and considered in my medical decision making (see chart for details).     Patient presents with dyspnea. Chest x-ray shows pulmonary vascular congestion with cardiomegaly, but no frank failure. He responded well to IV Lasix. He has primary care follow-up.  Final Clinical Impressions(s) / ED Diagnoses   Final diagnoses:  None    New Prescriptions New Prescriptions   No medications on file     Donnetta Hutching, MD 01/03/17 1257

## 2017-01-03 NOTE — ED Notes (Signed)
Emptied urinals after documenting outpt

## 2017-01-03 NOTE — ED Notes (Signed)
Patient transported to X-ray 

## 2017-01-03 NOTE — ED Triage Notes (Signed)
Patient complains of chest pain and shortness of breath that started yesterday. History of CHF. States lasix makes him retain fluid and has not been taking his diarrhetic.

## 2017-01-07 ENCOUNTER — Encounter (HOSPITAL_COMMUNITY): Payer: Self-pay | Admitting: *Deleted

## 2017-01-07 ENCOUNTER — Emergency Department (HOSPITAL_COMMUNITY): Payer: Medicare Other

## 2017-01-07 ENCOUNTER — Emergency Department (HOSPITAL_COMMUNITY)
Admission: EM | Admit: 2017-01-07 | Discharge: 2017-01-07 | Disposition: A | Discharge status: Home / Self Care | Payer: Medicare Other | Attending: Emergency Medicine | Admitting: Emergency Medicine

## 2017-01-07 DIAGNOSIS — Z7982 Long term (current) use of aspirin: Secondary | ICD-10-CM | POA: Diagnosis not present

## 2017-01-07 DIAGNOSIS — I110 Hypertensive heart disease with heart failure: Secondary | ICD-10-CM | POA: Diagnosis not present

## 2017-01-07 DIAGNOSIS — I10 Essential (primary) hypertension: Secondary | ICD-10-CM

## 2017-01-07 DIAGNOSIS — Z9111 Patient's noncompliance with dietary regimen: Secondary | ICD-10-CM | POA: Diagnosis not present

## 2017-01-07 DIAGNOSIS — I5043 Acute on chronic combined systolic (congestive) and diastolic (congestive) heart failure: Secondary | ICD-10-CM | POA: Diagnosis not present

## 2017-01-07 DIAGNOSIS — I4891 Unspecified atrial fibrillation: Secondary | ICD-10-CM | POA: Insufficient documentation

## 2017-01-07 DIAGNOSIS — Z79899 Other long term (current) drug therapy: Secondary | ICD-10-CM | POA: Insufficient documentation

## 2017-01-07 DIAGNOSIS — N182 Chronic kidney disease, stage 2 (mild): Secondary | ICD-10-CM | POA: Diagnosis not present

## 2017-01-07 DIAGNOSIS — Z87891 Personal history of nicotine dependence: Secondary | ICD-10-CM | POA: Insufficient documentation

## 2017-01-07 DIAGNOSIS — J45909 Unspecified asthma, uncomplicated: Secondary | ICD-10-CM | POA: Insufficient documentation

## 2017-01-07 DIAGNOSIS — R0789 Other chest pain: Secondary | ICD-10-CM | POA: Diagnosis not present

## 2017-01-07 DIAGNOSIS — I482 Chronic atrial fibrillation, unspecified: Secondary | ICD-10-CM

## 2017-01-07 DIAGNOSIS — I5042 Chronic combined systolic (congestive) and diastolic (congestive) heart failure: Secondary | ICD-10-CM | POA: Insufficient documentation

## 2017-01-07 DIAGNOSIS — R079 Chest pain, unspecified: Secondary | ICD-10-CM | POA: Diagnosis not present

## 2017-01-07 DIAGNOSIS — I129 Hypertensive chronic kidney disease with stage 1 through stage 4 chronic kidney disease, or unspecified chronic kidney disease: Secondary | ICD-10-CM | POA: Insufficient documentation

## 2017-01-07 DIAGNOSIS — Z7901 Long term (current) use of anticoagulants: Secondary | ICD-10-CM | POA: Diagnosis not present

## 2017-01-07 DIAGNOSIS — Z9114 Patient's other noncompliance with medication regimen: Secondary | ICD-10-CM | POA: Diagnosis not present

## 2017-01-07 LAB — I-STAT TROPONIN, ED: Troponin i, poc: 0 ng/mL (ref 0.00–0.08)

## 2017-01-07 LAB — CBC
HEMATOCRIT: 33.1 % — AB (ref 39.0–52.0)
HEMOGLOBIN: 10.2 g/dL — AB (ref 13.0–17.0)
MCH: 24.9 pg — ABNORMAL LOW (ref 26.0–34.0)
MCHC: 30.8 g/dL (ref 30.0–36.0)
MCV: 80.7 fL (ref 78.0–100.0)
Platelets: 202 10*3/uL (ref 150–400)
RBC: 4.1 MIL/uL — ABNORMAL LOW (ref 4.22–5.81)
RDW: 18 % — ABNORMAL HIGH (ref 11.5–15.5)
WBC: 3.9 10*3/uL — ABNORMAL LOW (ref 4.0–10.5)

## 2017-01-07 LAB — BASIC METABOLIC PANEL
ANION GAP: 9 (ref 5–15)
BUN: 16 mg/dL (ref 6–20)
CHLORIDE: 105 mmol/L (ref 101–111)
CO2: 24 mmol/L (ref 22–32)
Calcium: 8.6 mg/dL — ABNORMAL LOW (ref 8.9–10.3)
Creatinine, Ser: 1.05 mg/dL (ref 0.61–1.24)
GFR calc Af Amer: >60 mL/min (ref >60)
GLUCOSE: 93 mg/dL (ref 65–99)
POTASSIUM: 3.2 mmol/L — AB (ref 3.5–5.1)
SODIUM: 138 mmol/L (ref 135–145)

## 2017-01-07 LAB — BRAIN NATRIURETIC PEPTIDE: B NATRIURETIC PEPTIDE 5: 777 pg/mL — AB (ref 0.0–100.0)

## 2017-01-07 MED ORDER — FUROSEMIDE 10 MG/ML IJ SOLN
80.0000 mg | Freq: Once | INTRAMUSCULAR | Status: AC
  Administered 2017-01-07: 80 mg via INTRAVENOUS
  Filled 2017-01-07: qty 8

## 2017-01-07 MED ORDER — NITROGLYCERIN 2 % TD OINT
1.0000 [in_us] | TOPICAL_OINTMENT | Freq: Once | TRANSDERMAL | Status: AC
  Administered 2017-01-07: 1 [in_us] via TOPICAL
  Filled 2017-01-07: qty 1

## 2017-01-07 MED ORDER — SPIRONOLACTONE 25 MG PO TABS
50.0000 mg | ORAL_TABLET | Freq: Every day | ORAL | Status: DC
  Administered 2017-01-07: 50 mg via ORAL
  Filled 2017-01-07 (×2): qty 1

## 2017-01-07 MED ORDER — LISINOPRIL 5 MG PO TABS
5.0000 mg | ORAL_TABLET | Freq: Every day | ORAL | Status: DC
  Administered 2017-01-07: 5 mg via ORAL
  Filled 2017-01-07: qty 1

## 2017-01-07 MED ORDER — CARVEDILOL 12.5 MG PO TABS
25.0000 mg | ORAL_TABLET | Freq: Two times a day (BID) | ORAL | Status: DC
  Administered 2017-01-07: 25 mg via ORAL
  Filled 2017-01-07: qty 2

## 2017-01-07 MED ORDER — RIVAROXABAN 20 MG PO TABS
20.0000 mg | ORAL_TABLET | Freq: Every day | ORAL | Status: DC
  Administered 2017-01-07: 20 mg via ORAL
  Filled 2017-01-07: qty 1

## 2017-01-07 MED ORDER — BUMETANIDE 1 MG PO TABS
2.0000 mg | ORAL_TABLET | Freq: Two times a day (BID) | ORAL | Status: DC
  Administered 2017-01-07: 2 mg via ORAL
  Filled 2017-01-07 (×3): qty 1

## 2017-01-07 NOTE — ED Triage Notes (Signed)
Pt brought in by [facility] for c/o chest pain and sob; pt states the pain started earlier this evening; ems administered 324mg  of baby aspirin and pt states his pain is gone;

## 2017-01-07 NOTE — Discharge Instructions (Signed)
Take your usual prescriptions as previously directed.  Call your regular medical doctor today to schedule a follow up appointment within the next 2 days.  Return to the Emergency Department immediately sooner if worsening.  ° °

## 2017-01-07 NOTE — ED Notes (Signed)
Pt ambulated out of ED without difficulty.  Attempted to stop patient to give wheelchair, but he ignored me.  Asked primary nurse who stated she had him seated in wheelchair awaiting ride and was instructed to call when they arrived.  Pt left d/c papers in room as well.

## 2017-01-07 NOTE — ED Provider Notes (Signed)
[facility] Provider Note   CSN: [id number] Arrival date & time: 01/07/17  0555     History   Chief Complaint Chief Complaint  Patient presents with  . Chest Pain    HPI [name] is a 51 y.o. male.  The history is provided by the patient.  Shortness of Breath  This is a recurrent problem. The average episode lasts 24 hours. The problem has been gradually worsening. Associated symptoms include chest pain and leg swelling. Pertinent negatives include no fever and no abdominal pain. Associated medical issues include heart failure.  Patient with h/o obesity, nonischemic cardiomyopathy presents with increasing shortness of breath.  He reports h/o CHF and this feels similar to prior He had brief episode of CP that improved with ASA He reports no other unusual symptoms, this is all c/w prior CHF He reports med compliance   Past Medical History:  Diagnosis Date  . Allergic rhinitis   . Asthma   . Atrial fibrillation (HCC)   . CHF (congestive heart failure) (HCC)   . Essential hypertension   . Gastroesophageal reflux disease   . Heart attack (HCC)   . History of cardiac catheterization    Normal coronaries December 2016  . Noncompliance    Major problem leading to declining health and recurrent hospitalization  . Nonischemic cardiomyopathy (HCC)    LVEF 20-25%  . OSA (obstructive sleep apnea)   . Osteoarthritis   . Peptic ulcer disease     Patient Active Problem List   Diagnosis Date Noted  . Pedal edema 12/02/2016  . Acute CHF (congestive heart failure) (HCC) 11/09/2016  . Acute on chronic systolic CHF (congestive heart failure) (HCC) 08/19/2016  . CKD (chronic kidney disease), stage II 05/17/2016  . Coronary arteries, normal Dec 2016 01/23/2016  . Chronic anticoagulation-Xarelto 01/23/2016  . NSVT (nonsustained ventricular tachycardia) (HCC) 01/23/2016  . Elevated troponin 12/21/2015  . Nonischemic cardiomyopathy (HCC)   . Morbid obesity due to excess  calories (HCC)   . Noncompliance with diet and medication regimen 12/02/2015  . OSA (obstructive sleep apnea) 09/20/2015  . Pulmonary hypertension (HCC) 09/19/2015  . Normocytic anemia 09/19/2015  . Chronic atrial fibrillation (HCC)   . Dyspnea on exertion 05/28/2015  . Acute respiratory failure with hypoxia (HCC) 04/01/2015  . Hypokalemia 04/01/2015  . Obesity hypoventilation syndrome (HCC) 08/08/2014  . GERD (gastroesophageal reflux disease) 08/08/2014  . Edema of right lower extremity 06/21/2014  . Fecal urgency 06/21/2014  . Asthma 02/11/2009  . Hyperlipidemia 07/12/2008  . OBESITY, MORBID 07/12/2008  . Anxiety state 07/12/2008  . DEPRESSION 07/12/2008  . Hypertension 07/12/2008  . ALLERGIC RHINITIS 07/12/2008  . Peptic ulcer 07/12/2008  . OSTEOARTHRITIS 07/12/2008    Past Surgical History:  Procedure Laterality Date  . CARDIAC CATHETERIZATION N/A 06/14/2015   Procedure: Right/Left Heart Cath and Coronary Angiography;  Surgeon: Runell Gess, MD;  Location: [facility] INVASIVE CV LAB;  Service: Cardiovascular;  Laterality: N/A;       Home Medications    Prior to Admission medications   Medication Sig Start Date End Date Taking? Authorizing Provider  albuterol (PROVENTIL) (2.5 MG/3ML) 0.083% nebulizer solution Take 2.5 mg by nebulization every 6 (six) hours as needed for wheezing or shortness of breath.    [provider]  aspirin 81 MG chewable tablet Chew 81 mg by mouth daily.    [provider]  atorvastatin (LIPITOR) 40 MG tablet Take 1 tablet (40 mg total) by mouth daily at 6 PM. 06/25/16  Kathlen Mody, MD  beclomethasone (QVAR) 80 MCG/ACT inhaler Inhale 2 puffs into the lungs 2 (two) times daily.     [provider]  bumetanide (BUMEX) 2 MG tablet Take 1 tablet (2 mg total) by mouth 2 (two) times daily. 11/13/16   Catarina Hartshorn, MD  carvedilol (COREG) 25 MG tablet Take 1 tablet (25 mg total) by mouth 2 (two) times daily with a meal. 11/13/16   Tat,  Onalee Hua, MD  docusate sodium (COLACE) 100 MG capsule Take 1 capsule (100 mg total) by mouth 2 (two) times daily. Patient taking differently: Take 100 mg by mouth 2 (two) times daily as needed for mild constipation.  06/25/16   Kathlen Mody, MD  ferrous gluconate (FERGON) 324 MG tablet Take 1 tablet (324 mg total) by mouth 2 (two) times daily with a meal. Patient taking differently: Take 324 mg by mouth daily with supper.  06/25/16   Kathlen Mody, MD  furosemide (LASIX) 80 MG tablet Take 1 tablet (80 mg total) by mouth 2 (two) times daily. 12/06/16   Philip Aspen, Limmie Patricia, MD  lisinopril (PRINIVIL,ZESTRIL) 5 MG tablet Take 1 tablet (5 mg total) by mouth daily. 11/13/16   Catarina Hartshorn, MD  pantoprazole (PROTONIX) 40 MG tablet Take 1 tablet (40 mg total) by mouth daily at 6 (six) AM. Patient taking differently: Take 40 mg by mouth every evening.  06/26/16   Kathlen Mody, MD  potassium chloride SA (K-DUR,KLOR-CON) 20 MEQ tablet Take 2 tablets (40 mEq total) by mouth daily. Patient taking differently: Take 20-40 mEq by mouth daily.  10/27/16   Filbert Schilder, MD  rivaroxaban (XARELTO) 20 MG TABS tablet Take 1 tablet (20 mg total) by mouth daily with breakfast. Patient taking differently: Take 20 mg by mouth daily with supper.  06/26/16   Kathlen Mody, MD  spironolactone (ALDACTONE) 50 MG tablet Take 1 tablet (50 mg total) by mouth daily. 10/02/16   Houston Siren, MD  tamsulosin (FLOMAX) 0.4 MG CAPS capsule Take 1 capsule (0.4 mg total) by mouth daily. 06/25/16   Kathlen Mody, MD    Family History Family History  Problem Relation Age of Onset  . Stroke Father   . Heart attack Father   . Aneurysm Mother        Cerebral aneurysm  . Hypertension Sister   . Colon cancer Neg Hx   . Inflammatory bowel disease Neg Hx   . Liver disease Neg Hx     Social History Social History  Substance Use Topics  . Smoking status: Former Smoker    Packs/day: 0.50    Years: 20.00    Types: Cigarettes    Start date:  04/26/1988    Quit date: 06/23/2007  . Smokeless tobacco: Never Used     Comment: 1 ppd former smoker  . Alcohol use No     Comment: No etoh since 2009     Allergies   Bee venom; Food; Lipitor [atorvastatin]; Aspirin; Banana; Metolazone; Oatmeal; Orange juice [orange oil]; Torsemide; Diltiazem; and Hydralazine   Review of Systems Review of Systems  Constitutional: Negative for fever.  Respiratory: Positive for shortness of breath.   Cardiovascular: Positive for chest pain and leg swelling.  Gastrointestinal: Negative for abdominal pain.  All other systems reviewed and are negative.    Physical Exam Updated Vital Signs Pulse 82   Ht 1.829 m (6')   Wt 136.1 kg (300 lb)   SpO2 100%   BMI 40.69 kg/m   Physical Exam CONSTITUTIONAL:  Chronically ill appearing HEAD: Normocephalic/atraumatic EYES: wearing sunglasses ENMT: Mucous membranes moist NECK: supple no meningeal signs SPINE/BACK:entire spine nontender CV: S1/S2 noted LUNGS: decreased BS bilaterally, limited due to body habitus ABDOMEN: soft, nontender, obese GU:no cva tenderness NEURO: Pt is awake/alert/appropriate, moves all extremitiesx4.  EXTREMITIES: pulses normal/equal, full ROM, chronic LE edema noted to bilateral LE SKIN: warm, color normal PSYCH: no abnormalities of mood noted, alert and oriented to situation   ED Treatments / Results  Labs (all labs ordered are listed, but only abnormal results are displayed) Labs Reviewed  BASIC METABOLIC PANEL - Abnormal; Notable for the following:       Result Value   Potassium 3.2 (*)    Calcium 8.6 (*)    All other components within normal limits  CBC - Abnormal; Notable for the following:    WBC 3.9 (*)    RBC 4.10 (*)    Hemoglobin 10.2 (*)    HCT 33.1 (*)    MCH 24.9 (*)    RDW 18.0 (*)    All other components within normal limits  BRAIN NATRIURETIC PEPTIDE  I-STAT TROPONIN, ED    EKG  EKG Interpretation  Date/Time:  Thursday January 07 2017 06:10:39  EDT Ventricular Rate:  97 PR Interval:    QRS Duration: 91 QT Interval:  374 QTC Calculation: 476 R Axis:   61 Text Interpretation:  Atrial fibrillation Ventricular premature complex Borderline repolarization abnormality Borderline prolonged QT interval Interpretation limited secondary to artifact Confirmed by [name] ([id number]) on 01/07/2017 6:19:29 AM       Radiology Dg Chest 2 View  Result Date: 01/07/2017 CLINICAL DATA:  Chest pain EXAM: CHEST  2 VIEW COMPARISON:  Four days ago FINDINGS: Cardiomegaly and vascular congestion. No Kerley lines or air bronchogram. Mild haziness of lower lung zones, chronic and accentuated by patient size. No effusion or pneumothorax. IMPRESSION: Baseline appearance of the chest. Cardiomegaly and vascular congestion. Electronically Signed   By: Marnee Spring M.D.   On: 01/07/2017 07:08    Procedures Procedures  Medications Ordered in ED Medications  furosemide (LASIX) injection 80 mg (not administered)  nitroGLYCERIN (NITROGLYN) 2 % ointment 1 inch (not administered)     Initial Impression / Assessment and Plan / ED Course  I have reviewed the triage vital signs and the nursing notes.  Pertinent labs & imaging results that were available during my care of the patient were reviewed by me and considered in my medical decision making (see chart for details).     7:28 AM Pt well known to the ED, here with feeling like his CHF is worsening He is in no distress Plan to treat with NTG and IV lasix Signed out to dr Clarene Duke for re-evaluation after meds I anticipate d/c home for patient I have also asked for an in-ED cardiology consultation   Final Clinical Impressions(s) / ED Diagnoses   Final diagnoses:  Chronic atrial fibrillation (HCC)  Chronic combined systolic and diastolic congestive heart failure [facility])    New Prescriptions New Prescriptions   No medications on file     [name], MD 01/07/17 [id number]

## 2017-01-07 NOTE — ED Provider Notes (Signed)
Pt received at sign out with ntg paste and IV lasix given. Pt well known to ED for non-compliance with CHF diet (because he wants to eat "regular food like everyone else") and meds ("increases legs swelling"). Pt's home meds were ordered and given. Pt has diuresed 2540ml+ urine. VS have improved. Pt has stood and ambulated a short distance per his baseline gait with Sats remaining 99% on his usual O2 3L N/C. No clear indication for admission at this time. Cards service has evaluated pt and agrees with ED plan. Will d/c pt home. Pt strongly encouraged to follow diet and take his meds and f/u with PMD within the next 2 days.    Patient Vitals for the past 24 hrs:  BP Temp Temp src Pulse Resp SpO2 Height Weight  01/07/17 1200 (!) 125/97 - - 81 (!) 21 100 % - -  01/07/17 1130 (!) 134/101 - - 67 16 100 % - -  01/07/17 1100 (!) 128/106 - - (!) 55 (!) 23 95 % - -  01/07/17 1030 (!) 133/93 - - 87 19 100 % - -  01/07/17 1000 (!) 150/106 - - 100 19 100 % - -  01/07/17 0930 (!) 139/118 - - 76 20 100 % - -  01/07/17 0900 (!) 139/112 - - (!) 41 18 100 % - -  01/07/17 0838 (!) 182/122 - - 85 (!) 22 100 % - -  01/07/17 0830 (!) 155/124 - - (!) 51 (!) 24 100 % - -  01/07/17 0754 - - - - - - - (!) 148.2 kg (326 lb 11.2 oz)  01/07/17 0738 (!) 157/113 99.7 F (37.6 C) Oral (!) 104 20 100 % - -  01/07/17 0715 - - - 82 - 100 % - -  01/07/17 0558 - - - - - - 6' (1.829 m) 136.1 kg (300 lb)       Samuel Jester, DO 01/07/17 1254

## 2017-01-07 NOTE — ED Notes (Signed)
Called family to pick up pt

## 2017-01-07 NOTE — ED Notes (Signed)
Pt able to stand and took 2 steps but gait unsteady due to weakness. Sats remained 99% on 3 L/min

## 2017-01-07 NOTE — Consult Note (Signed)
Cardiology Consultation:   Patient ID: [name]; [id number]; [birth year]   Admit date: 01/07/2017 Date of Consult: 01/07/2017  Primary Care Provider: Pearson Grippe, MD Primary Cardiologist: Purvis Sheffield    Patient Profile:   [name] is a 51 y.o. male with a hx of chronic combined CHF, medical and dietary non-compliance, atrial fib on Xarelto, NICM,  psychiatric issues, morbid obesity who has been dismissed from our practice due to no shows, and chronic non-compliance with regimen, who is being seen today for the evaluation of recurrent acute on chronic mixed CHF at the request of Dr. Alwyn Pea, ER physician.   History of Present Illness:   [name] is well known to our practice with frequent hospital and ER admissions for decompensated combine CHF, due to non-compliance with medications and dietary restrictions. He has been dismissed from our practice in this setting.   Came to ER with complaints of chest pain, found to be hypertensive 171/109, in CHF with vascular congestion, BNP 777.0,anemic with Hgb of 10;2, Hct 33.1, potassium of 3.2. Troponin is negative. EKG demonstrates Treated with IV lasix, NTG paste and given home medications ordered by Dr. Alwyn Pea. He has begun to diurese. He states that he is taking his medications which include Bumex, which he insists is causing him to have edema  fluid retention and leg cramps. He states he has continued to eat " regular food that everyone else eats."  He has not yet found another cardiologist.    Past Medical History:  Diagnosis Date  . Allergic rhinitis   . Asthma   . Atrial fibrillation (HCC)   . CHF (congestive heart failure) (HCC)   . Essential hypertension   . Gastroesophageal reflux disease   . Heart attack (HCC)   . History of cardiac catheterization    Normal coronaries December 2016  . Noncompliance    Major problem leading to declining health and recurrent hospitalization  . Nonischemic cardiomyopathy (HCC)    LVEF  20-25%  . OSA (obstructive sleep apnea)   . Osteoarthritis   . Peptic ulcer disease     Past Surgical History:  Procedure Laterality Date  . CARDIAC CATHETERIZATION N/A 06/14/2015   Procedure: Right/Left Heart Cath and Coronary Angiography;  Surgeon: Runell Gess, MD;  Location: [facility] INVASIVE CV LAB;  Service: Cardiovascular;  Laterality: N/A;     Inpatient Medications: Scheduled Meds: . bumetanide  2 mg Oral BID  . carvedilol  25 mg Oral BID WC  . lisinopril  5 mg Oral Daily  . rivaroxaban  20 mg Oral Q supper  . spironolactone  50 mg Oral Daily   Continuous Infusions:  PRN Meds:   Allergies:    Allergies  Allergen Reactions  . Banana Shortness Of Breath  . Bee Venom Shortness Of Breath, Swelling and Other (See Comments)    Reaction:  Facial swelling  . Food Shortness Of Breath, Rash and Other (See Comments)    Pt states that he is allergic to strawberries.    . Lipitor [Atorvastatin] Shortness Of Breath and Other (See Comments)    Reaction:  Nose bleeds   . Aspirin Other (See Comments)    Reaction:  GI upset   . Metolazone Other (See Comments)    Pt states that he stopped taking this med due to heart attack like symptoms.   . Oatmeal Nausea And Vomiting  . Orange Juice [Orange Oil] Nausea And Vomiting    All acidic products make him nauseous and  upset stomach  . Torsemide Swelling and Other (See Comments)    Reaction:  Swelling of feet/legs   . Diltiazem Palpitations  . Hydralazine Palpitations    Social History:   Social History   Social History  . Marital status: Divorced    Spouse name: N/A  . Number of children: 3  . Years of education: N/A   Occupational History  . disabled    Social History Main Topics  . Smoking status: Former Smoker    Packs/day: 0.50    Years: 20.00    Types: Cigarettes    Start date: 04/26/1988    Quit date: 06/23/2007  . Smokeless tobacco: Never Used     Comment: 1 ppd former smoker  . Alcohol use No     Comment: No  etoh since 2009  . Drug use: No  . Sexual activity: No   Other Topics Concern  . Not on file   Social History Narrative  . No narrative on file    Family History:   The patient's family history includes Aneurysm in his mother; Heart attack in his father; Hypertension in his sister; Stroke in his father. There is no history of Colon cancer, Inflammatory bowel disease, or Liver disease.  ROS:  Please see the history of present illness.  ROS  All other ROS reviewed and negative.     Physical Exam/Data:   Vitals:   01/07/17 0738 01/07/17 0754 01/07/17 0830 01/07/17 0838  BP: (!) 157/113  (!) 155/124 (!) 182/122  Pulse: (!) 104  (!) 51 85  Resp: 20  (!) 24 (!) 22  Temp: 99.7 F (37.6 C)     TempSrc: Oral     SpO2: 100%  100% 100%  Weight:  (!) 326 lb 11.2 oz (148.2 kg)    Height:        Intake/Output Summary (Last 24 hours) at 01/07/17 0923 Last data filed at 01/07/17 0835  Gross per 24 hour  Intake                0 ml  Output              700 ml  Net             -700 ml   Filed Weights   01/07/17 0558 01/07/17 0754  Weight: 300 lb (136.1 kg) (!) 326 lb 11.2 oz (148.2 kg)   Body mass index is 44.31 kg/m.  General:  Well nourished, well developed, in no acute distress, morbidly obese. HEENT: normal.Wearing sunglasses Lymph: no adenopathy Neck: no JVD Endocrine:  No thryomegaly Vascular: No carotid bruits; FA pulses 2+ bilaterally without bruits  Cardiac:  IRRR; no murmur 1/6 systolic murmur, 2+ -3+ woody edema bilaterally. Skin taunt, no weeping.  Lungs:  Clear to auscultation bilaterally, no wheezing, rhonchi or rales  Abd: soft, nontender, no hepatomegaly  Ext: no edema Musculoskeletal:  No deformities, BUE and BLE strength normal and equal Skin: warm and dry  Neuro:  CNs 2-12 intact, no focal abnormalities noted Psych:  Normal affect   EKG:  The EKG was personally reviewed and demonstrates: Atrial fib, rate of 97 bpm  Telemetry:  Telemetry was personally  reviewed and demonstrates:  Atrial fib rates in the 80's.   Relevant CV Studies: Echocardiogram 11/10/2016 Procedure narrative: Transthoracic echocardiography. Image   quality was adequate. Intravenous contrast (Definity) was   administered. - Left ventricle: The cavity size was mildly dilated. Wall   thickness was increased in a  pattern of mild LVH. Systolic   function was moderately reduced. The estimated ejection fraction   was in the range of 35% to 40%. Diffuse hypokinesis. The study   was not technically sufficient to allow evaluation of LV   diastolic dysfunction due to atrial fibrillation. - Regional wall motion abnormality: Akinesis of the basal-mid   inferior myocardium. - Mitral valve: There was mild regurgitation. - Left atrium: The atrium was mildly dilated. - Right ventricle: The cavity size was mildly dilated. Systolic   function was mildly reduced. - Right atrium: The atrium was mildly dilated. - Atrial septum: There was a patent foramen ovale. - Tricuspid valve: There was mild regurgitation. - Pulmonary arteries: PA peak pressure: 56 mm Hg (S). - Inferior vena cava: The vessel was dilated. The respirophasic   diameter changes were blunted (< 50%), consistent with elevated   central venous pressure.  Laboratory Data:  Chemistry Recent Labs Lab 01/03/17 0741 01/07/17 0602  NA 145 138  K 3.4* 3.2*  CL 108 105  CO2 25 24  GLUCOSE 102* 93  BUN 18 16  CREATININE 1.15 1.05  CALCIUM 9.3 8.6*  GFRNONAA >60 >60  GFRAA >60 >60  ANIONGAP 12 9    Hematology Recent Labs Lab 01/03/17 0741 01/07/17 0602  WBC 4.2 3.9*  RBC 4.36 4.10*  HGB 10.9* 10.2*  HCT 35.3* 33.1*  MCV 81.0 80.7  MCH 25.0* 24.9*  MCHC 30.9 30.8  RDW 18.3* 18.0*  PLT 204 202   Cardiac Enzymes Recent Labs Lab 01/03/17 0741  TROPONINI <0.03    Recent Labs Lab 01/07/17 0648  TROPIPOC 0.00    BNP Recent Labs Lab 01/07/17 0602  BNP 777.0*    DDimer No results for input(s):  DDIMER in the last 168 hours.  Radiology/Studies:  Dg Chest 2 View  Result Date: 01/07/2017 CLINICAL DATA:  Chest pain EXAM: CHEST  2 VIEW COMPARISON:  Four days ago FINDINGS: Cardiomegaly and vascular congestion. No Kerley lines or air bronchogram. Mild haziness of lower lung zones, chronic and accentuated by patient size. No effusion or pneumothorax. IMPRESSION: Baseline appearance of the chest. Cardiomegaly and vascular congestion. Electronically Signed   By: Marnee Spring M.D.   On: 01/07/2017 07:08    Assessment and Plan:   1.Acute on Chronic Combined CHF: By documented weight is up from 299 lbs on discharge in May of 2018, to 326 lbs today. He states that he is taking his medications but is insistent that the medications are causing him to swell, and the only thing that takes the fluid away is coming to the ER for IV medications. He states that he has no plans to change his eating habits, and wants to eat what "eveyone else eats.".   Will need to be diuresed with IV lasix as ER has started, may need IP ongoing diureses to go back to baseline weight. Cardiology will not  be following him unless emergent situation occurs. Would recommend potassium replacement as he is hypokalemic which may be causing his leg cramping. Add magnesium as well, as he is on PPI.   Compliance issues remain his main challenge, and will detrimentally affect his overall outcome, and reduce his life expectancy. This has been explained to him. He verbalizes understanding, but does not appear to want to change his current eating habits. He is willing to take his medications.  Cardiology recommends that he continue current home regimen of medications.   2. Hypertension: Multifactorial. Continue home medications and take as prescribed.  3. Atrial fibrillation: Heart rate is essentially controlled. Continue Xarelto and coreg.   Joni Reining DNP, ANP-C 01/07/2017 9:23 AM   The patient was seen and examined, and I  agree with the history, physical exam, assessment and plan as documented above, with modifications as noted below. I have also personally reviewed all relevant documentation, old records, labs, and both radiographic and cardiovascular studies. I have also independently interpreted old and new ECG's.  51 year old male well known to me with aforementioned cardiovascular history presented to the ED with chest pain and shortness of breath and found to be in acute on chronic combined congestive heart failure with accelerated hypertension.   He has been given medications as noted above including IV Lasix and has already put out over 2.3 L. He is feeling better. Blood pressure has since normalized.   Primary issues continue to be dietary and medication noncompliance. He would benefit from a psychiatric evaluation.  I spoke with Dr. Clarene Duke and we both feel he can be discharged from the ED.  Prentice Docker, MD, [facility]  01/07/2017 12:50 PM

## 2017-01-07 NOTE — ED Notes (Signed)
Dr Clarene Duke is aware of BPs and has ordered morning meds

## 2017-01-08 DIAGNOSIS — I5043 Acute on chronic combined systolic (congestive) and diastolic (congestive) heart failure: Secondary | ICD-10-CM | POA: Diagnosis not present

## 2017-01-09 DIAGNOSIS — I4891 Unspecified atrial fibrillation: Secondary | ICD-10-CM | POA: Diagnosis not present

## 2017-01-09 DIAGNOSIS — Z79899 Other long term (current) drug therapy: Secondary | ICD-10-CM | POA: Diagnosis not present

## 2017-01-09 DIAGNOSIS — Z87891 Personal history of nicotine dependence: Secondary | ICD-10-CM | POA: Diagnosis not present

## 2017-01-09 DIAGNOSIS — R079 Chest pain, unspecified: Secondary | ICD-10-CM | POA: Diagnosis not present

## 2017-01-09 DIAGNOSIS — E7800 Pure hypercholesterolemia, unspecified: Secondary | ICD-10-CM | POA: Diagnosis not present

## 2017-01-09 DIAGNOSIS — I252 Old myocardial infarction: Secondary | ICD-10-CM | POA: Diagnosis not present

## 2017-01-09 DIAGNOSIS — I517 Cardiomegaly: Secondary | ICD-10-CM | POA: Diagnosis not present

## 2017-01-09 DIAGNOSIS — I110 Hypertensive heart disease with heart failure: Secondary | ICD-10-CM | POA: Diagnosis not present

## 2017-01-09 DIAGNOSIS — M1990 Unspecified osteoarthritis, unspecified site: Secondary | ICD-10-CM | POA: Diagnosis not present

## 2017-01-09 DIAGNOSIS — Z7901 Long term (current) use of anticoagulants: Secondary | ICD-10-CM | POA: Diagnosis not present

## 2017-01-09 DIAGNOSIS — K219 Gastro-esophageal reflux disease without esophagitis: Secondary | ICD-10-CM | POA: Diagnosis not present

## 2017-01-09 DIAGNOSIS — J449 Chronic obstructive pulmonary disease, unspecified: Secondary | ICD-10-CM | POA: Diagnosis not present

## 2017-01-09 DIAGNOSIS — I509 Heart failure, unspecified: Secondary | ICD-10-CM | POA: Diagnosis not present

## 2017-01-17 IMAGING — DX DG CHEST 2V
2 series · 2 of 2 positions shown · non-contrast
Comparison: 04/01/2015

CLINICAL DATA: Cough, shortness of Breath

EXAM:
CHEST  2 VIEW

[chest pa]
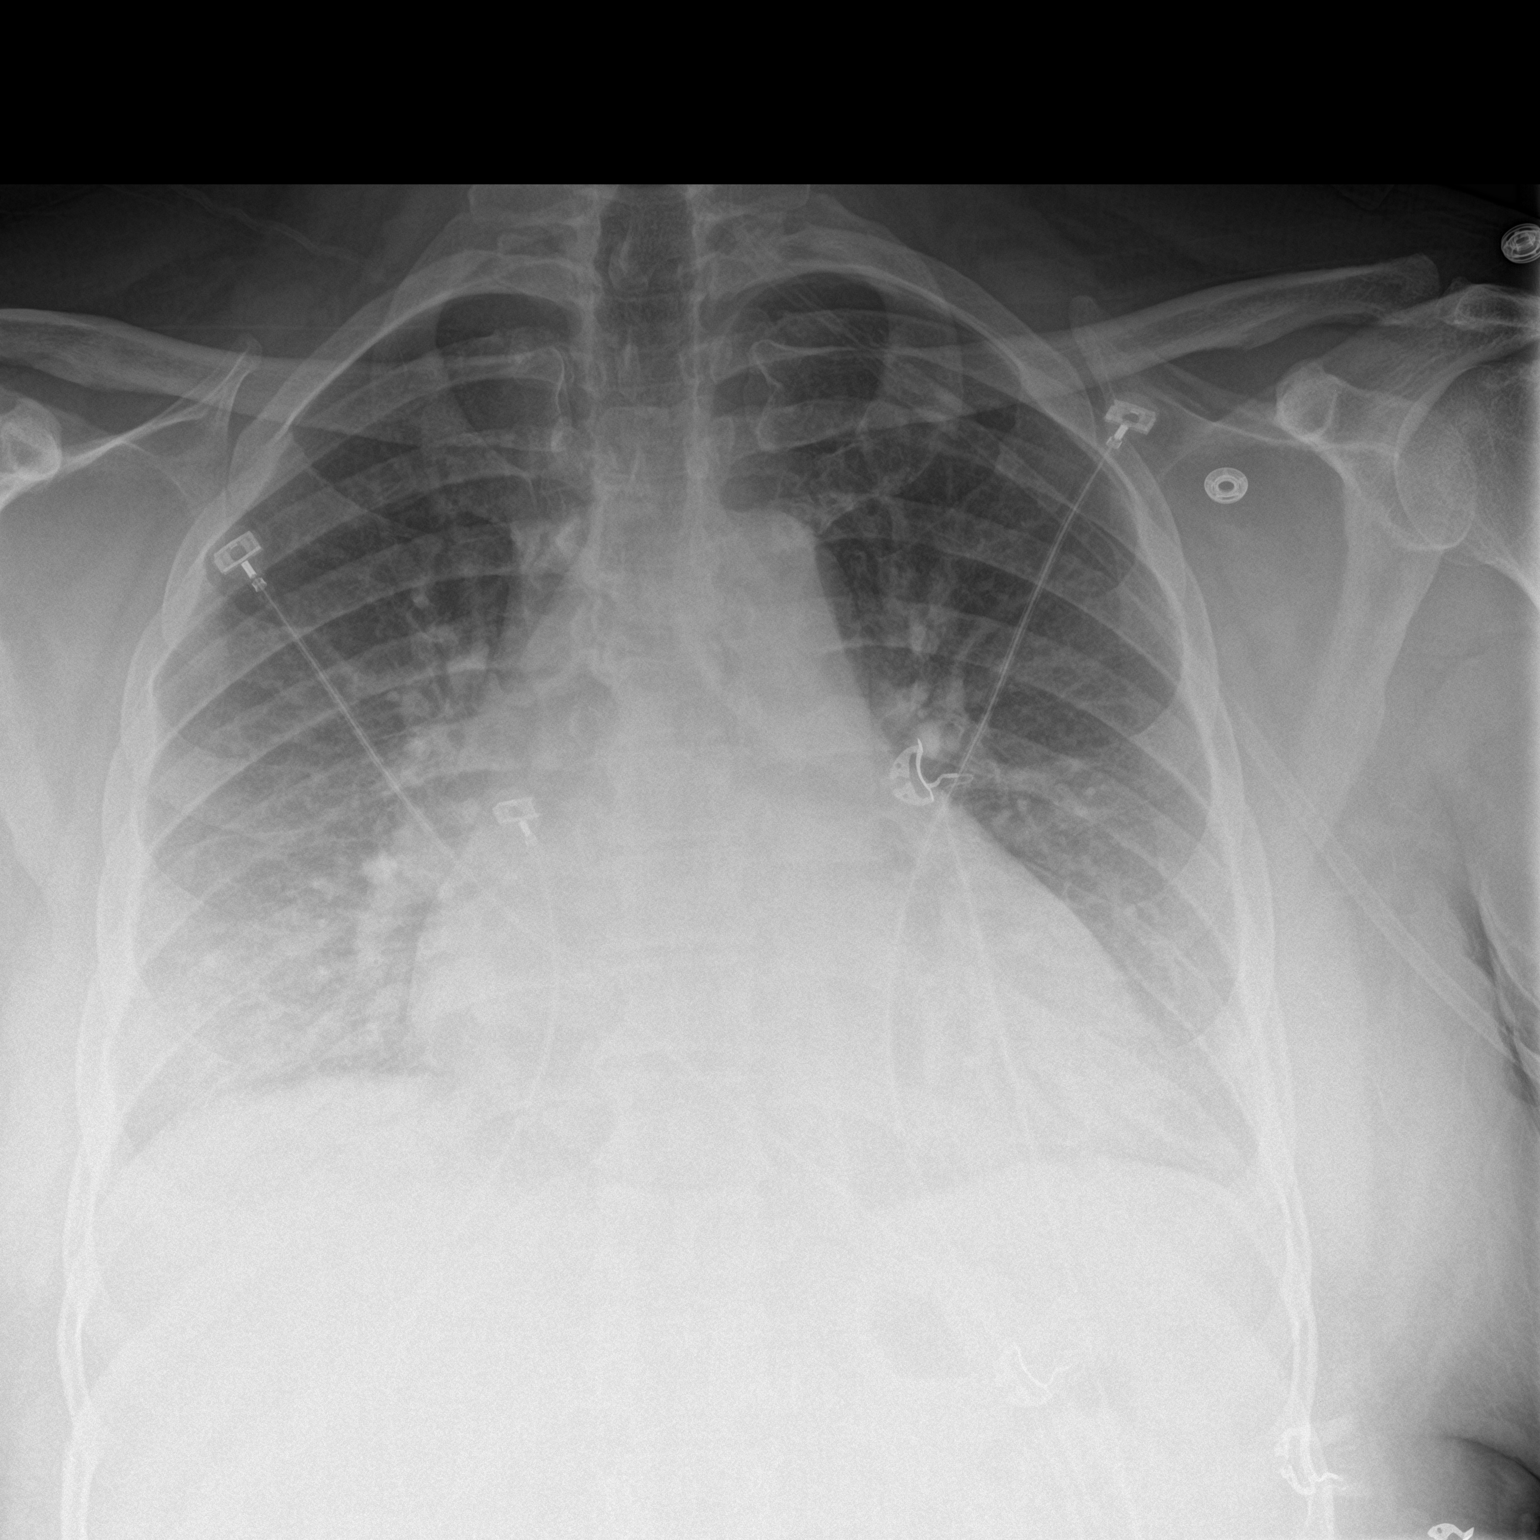

[chest lat]
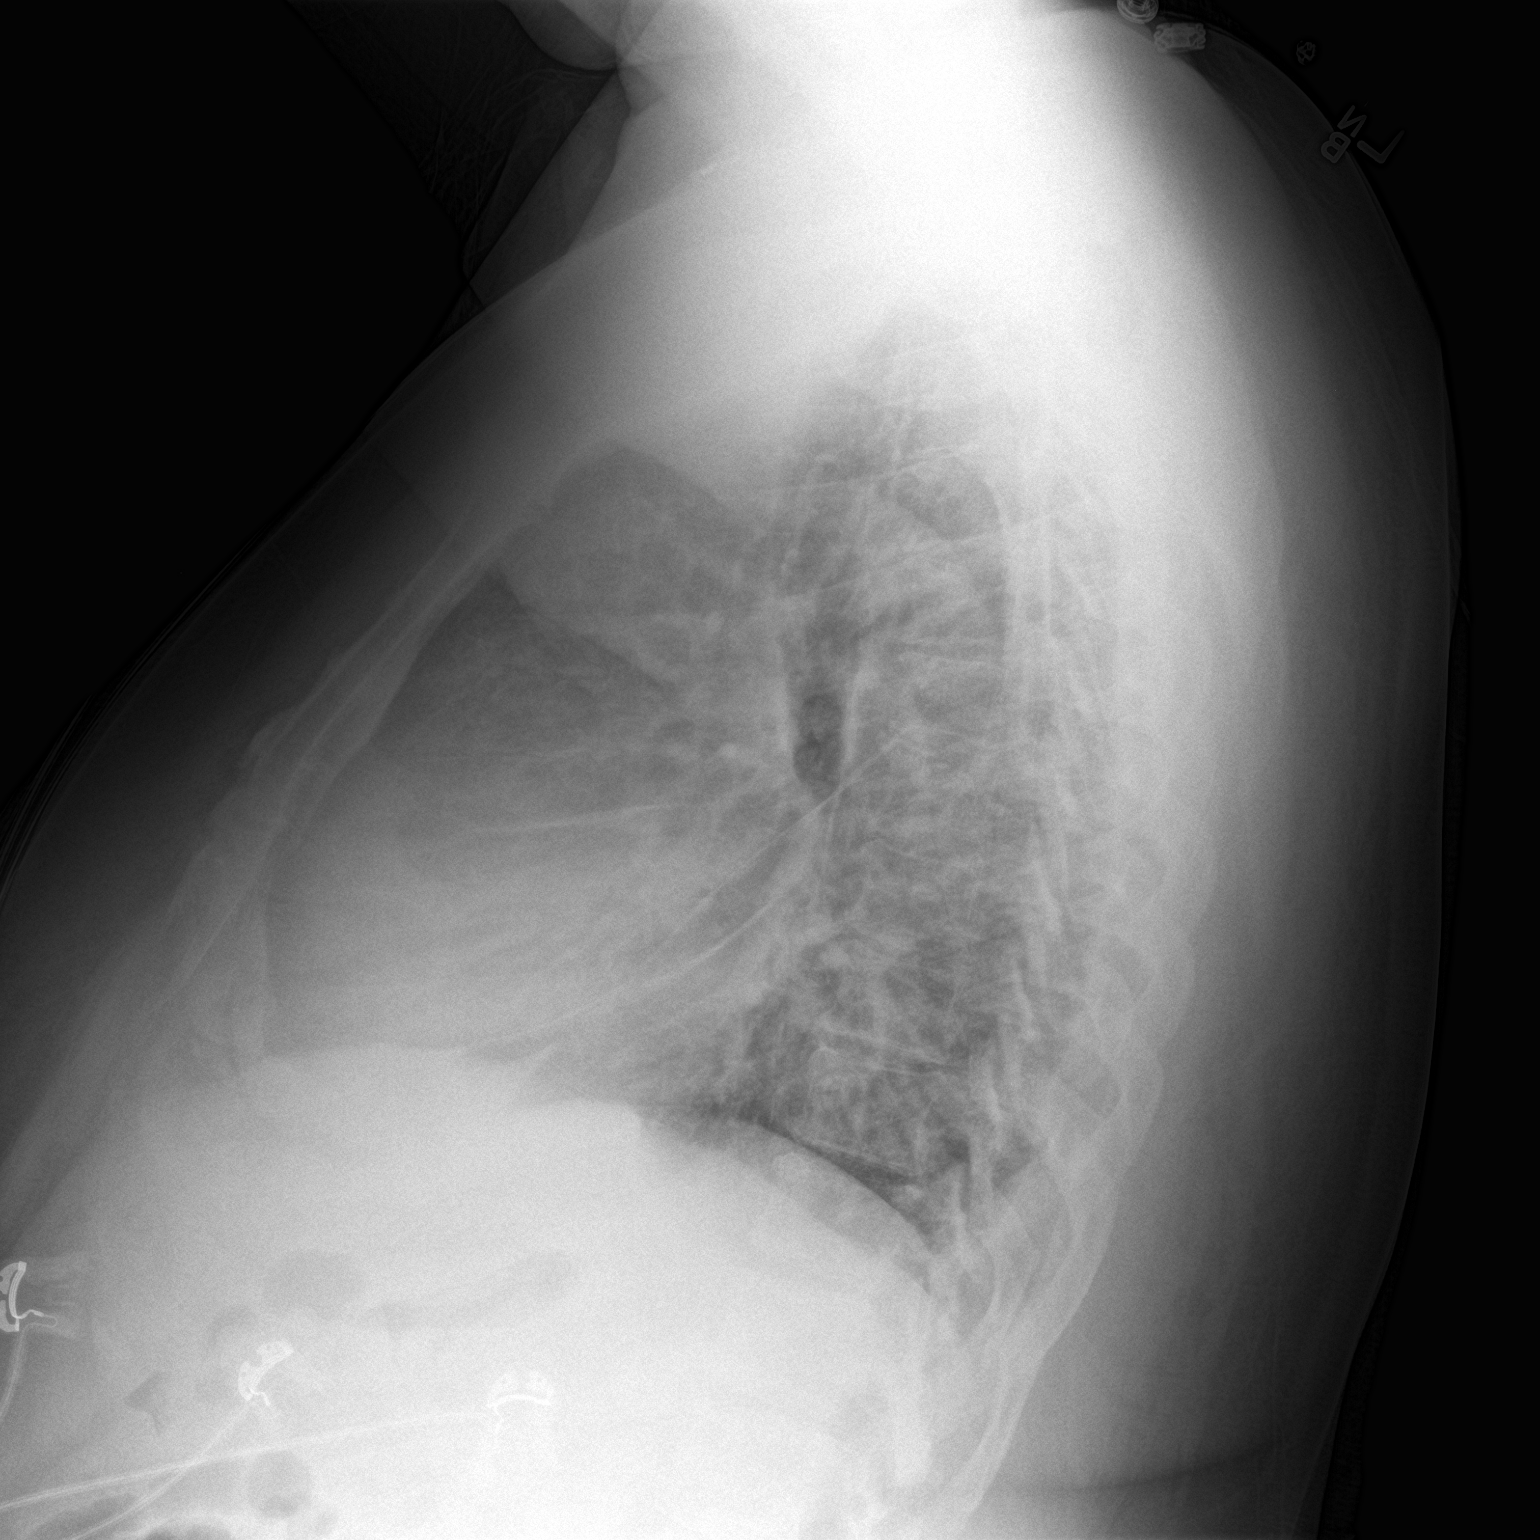

[2 of 2 positions shown; findings below may reference images not displayed]

FINDINGS: Cardiomediastinal silhouette is stable. Central mild vascular
congestion without convincing pulmonary edema. Mild infrahilar
bronchitic changes.
IMPRESSION: Central vascular congestion without convincing pulmonary edema. Mild
infrahilar bronchitic changes.

## 2017-01-18 ENCOUNTER — Encounter (HOSPITAL_COMMUNITY): Payer: Self-pay | Admitting: *Deleted

## 2017-01-18 ENCOUNTER — Emergency Department (HOSPITAL_COMMUNITY): Payer: Medicare Other

## 2017-01-18 ENCOUNTER — Observation Stay (HOSPITAL_COMMUNITY)
Admission: EM | Admit: 2017-01-18 | Discharge: 2017-01-19 | Disposition: A | Discharge status: Home / Self Care | Payer: Medicare Other | Attending: Internal Medicine | Admitting: Internal Medicine

## 2017-01-18 DIAGNOSIS — Z886 Allergy status to analgesic agent status: Secondary | ICD-10-CM | POA: Diagnosis not present

## 2017-01-18 DIAGNOSIS — I130 Hypertensive heart and chronic kidney disease with heart failure and stage 1 through stage 4 chronic kidney disease, or unspecified chronic kidney disease: Principal | ICD-10-CM | POA: Insufficient documentation

## 2017-01-18 DIAGNOSIS — Z91148 Patient's other noncompliance with medication regimen for other reason: Secondary | ICD-10-CM

## 2017-01-18 DIAGNOSIS — Z79899 Other long term (current) drug therapy: Secondary | ICD-10-CM | POA: Diagnosis not present

## 2017-01-18 DIAGNOSIS — R931 Abnormal findings on diagnostic imaging of heart and coronary circulation: Secondary | ICD-10-CM | POA: Insufficient documentation

## 2017-01-18 DIAGNOSIS — D649 Anemia, unspecified: Secondary | ICD-10-CM | POA: Insufficient documentation

## 2017-01-18 DIAGNOSIS — I482 Chronic atrial fibrillation, unspecified: Secondary | ICD-10-CM | POA: Diagnosis present

## 2017-01-18 DIAGNOSIS — Z7901 Long term (current) use of anticoagulants: Secondary | ICD-10-CM | POA: Diagnosis not present

## 2017-01-18 DIAGNOSIS — N182 Chronic kidney disease, stage 2 (mild): Secondary | ICD-10-CM | POA: Diagnosis not present

## 2017-01-18 DIAGNOSIS — Z9111 Patient's noncompliance with dietary regimen: Secondary | ICD-10-CM | POA: Diagnosis not present

## 2017-01-18 DIAGNOSIS — I10 Essential (primary) hypertension: Secondary | ICD-10-CM | POA: Diagnosis present

## 2017-01-18 DIAGNOSIS — Z823 Family history of stroke: Secondary | ICD-10-CM | POA: Insufficient documentation

## 2017-01-18 DIAGNOSIS — R0989 Other specified symptoms and signs involving the circulatory and respiratory systems: Secondary | ICD-10-CM | POA: Diagnosis not present

## 2017-01-18 DIAGNOSIS — R0602 Shortness of breath: Secondary | ICD-10-CM | POA: Insufficient documentation

## 2017-01-18 DIAGNOSIS — Z87891 Personal history of nicotine dependence: Secondary | ICD-10-CM | POA: Insufficient documentation

## 2017-01-18 DIAGNOSIS — E876 Hypokalemia: Secondary | ICD-10-CM | POA: Diagnosis present

## 2017-01-18 DIAGNOSIS — I509 Heart failure, unspecified: Secondary | ICD-10-CM | POA: Diagnosis not present

## 2017-01-18 DIAGNOSIS — Z9889 Other specified postprocedural states: Secondary | ICD-10-CM | POA: Insufficient documentation

## 2017-01-18 DIAGNOSIS — J849 Interstitial pulmonary disease, unspecified: Secondary | ICD-10-CM | POA: Insufficient documentation

## 2017-01-18 DIAGNOSIS — R079 Chest pain, unspecified: Secondary | ICD-10-CM | POA: Diagnosis not present

## 2017-01-18 DIAGNOSIS — J45909 Unspecified asthma, uncomplicated: Secondary | ICD-10-CM | POA: Insufficient documentation

## 2017-01-18 DIAGNOSIS — I252 Old myocardial infarction: Secondary | ICD-10-CM | POA: Insufficient documentation

## 2017-01-18 DIAGNOSIS — Z91018 Allergy to other foods: Secondary | ICD-10-CM | POA: Insufficient documentation

## 2017-01-18 DIAGNOSIS — Z8249 Family history of ischemic heart disease and other diseases of the circulatory system: Secondary | ICD-10-CM | POA: Diagnosis not present

## 2017-01-18 DIAGNOSIS — N183 Chronic kidney disease, stage 3 (moderate): Secondary | ICD-10-CM | POA: Diagnosis not present

## 2017-01-18 DIAGNOSIS — Z888 Allergy status to other drugs, medicaments and biological substances status: Secondary | ICD-10-CM | POA: Diagnosis not present

## 2017-01-18 DIAGNOSIS — I429 Cardiomyopathy, unspecified: Secondary | ICD-10-CM | POA: Insufficient documentation

## 2017-01-18 DIAGNOSIS — R918 Other nonspecific abnormal finding of lung field: Secondary | ICD-10-CM | POA: Insufficient documentation

## 2017-01-18 DIAGNOSIS — R7989 Other specified abnormal findings of blood chemistry: Secondary | ICD-10-CM | POA: Diagnosis present

## 2017-01-18 DIAGNOSIS — R609 Edema, unspecified: Secondary | ICD-10-CM | POA: Diagnosis not present

## 2017-01-18 DIAGNOSIS — R100 Acute abdomen: Secondary | ICD-10-CM | POA: Diagnosis not present

## 2017-01-18 DIAGNOSIS — I5021 Acute systolic (congestive) heart failure: Secondary | ICD-10-CM | POA: Diagnosis present

## 2017-01-18 DIAGNOSIS — Z9114 Patient's other noncompliance with medication regimen: Secondary | ICD-10-CM | POA: Diagnosis not present

## 2017-01-18 DIAGNOSIS — R600 Localized edema: Secondary | ICD-10-CM | POA: Diagnosis not present

## 2017-01-18 DIAGNOSIS — Z7951 Long term (current) use of inhaled steroids: Secondary | ICD-10-CM | POA: Insufficient documentation

## 2017-01-18 DIAGNOSIS — R748 Abnormal levels of other serum enzymes: Secondary | ICD-10-CM | POA: Diagnosis not present

## 2017-01-18 DIAGNOSIS — Z91030 Bee allergy status: Secondary | ICD-10-CM | POA: Insufficient documentation

## 2017-01-18 DIAGNOSIS — R2243 Localized swelling, mass and lump, lower limb, bilateral: Secondary | ICD-10-CM | POA: Insufficient documentation

## 2017-01-18 DIAGNOSIS — I5023 Acute on chronic systolic (congestive) heart failure: Secondary | ICD-10-CM | POA: Insufficient documentation

## 2017-01-18 DIAGNOSIS — R0682 Tachypnea, not elsewhere classified: Secondary | ICD-10-CM | POA: Diagnosis not present

## 2017-01-18 DIAGNOSIS — Z91119 Patient's noncompliance with dietary regimen due to unspecified reason: Secondary | ICD-10-CM

## 2017-01-18 DIAGNOSIS — R778 Other specified abnormalities of plasma proteins: Secondary | ICD-10-CM | POA: Diagnosis present

## 2017-01-18 LAB — COMPREHENSIVE METABOLIC PANEL
ALT: 23 U/L (ref 17–63)
ANION GAP: 7 (ref 5–15)
AST: 29 U/L (ref 15–41)
Albumin: 3.6 g/dL (ref 3.5–5.0)
Alkaline Phosphatase: 79 U/L (ref 38–126)
BILIRUBIN TOTAL: 1.3 mg/dL — AB (ref 0.3–1.2)
BUN: 15 mg/dL (ref 6–20)
CHLORIDE: 107 mmol/L (ref 101–111)
CO2: 24 mmol/L (ref 22–32)
Calcium: 8.7 mg/dL — ABNORMAL LOW (ref 8.9–10.3)
Creatinine, Ser: 1.01 mg/dL (ref 0.61–1.24)
GFR calc Af Amer: >60 mL/min (ref >60)
Glucose, Bld: 109 mg/dL — ABNORMAL HIGH (ref 65–99)
POTASSIUM: 3.3 mmol/L — AB (ref 3.5–5.1)
SODIUM: 138 mmol/L (ref 135–145)
TOTAL PROTEIN: 6.7 g/dL (ref 6.5–8.1)

## 2017-01-18 LAB — CBC WITH DIFFERENTIAL/PLATELET
Basophils Absolute: 0 10*3/uL (ref 0.0–0.1)
Basophils Relative: 0 %
EOS ABS: 0.1 10*3/uL (ref 0.0–0.7)
EOS PCT: 2 %
HCT: 34.5 % — ABNORMAL LOW (ref 39.0–52.0)
Hemoglobin: 10.7 g/dL — ABNORMAL LOW (ref 13.0–17.0)
LYMPHS ABS: 0.9 10*3/uL (ref 0.7–4.0)
LYMPHS PCT: 24 %
MCH: 25.2 pg — AB (ref 26.0–34.0)
MCHC: 31 g/dL (ref 30.0–36.0)
MCV: 81.2 fL (ref 78.0–100.0)
MONO ABS: 0.3 10*3/uL (ref 0.1–1.0)
MONOS PCT: 7 %
NEUTROS ABS: 2.6 10*3/uL (ref 1.7–7.7)
NEUTROS PCT: 67 %
PLATELETS: 185 10*3/uL (ref 150–400)
RBC: 4.25 MIL/uL (ref 4.22–5.81)
RDW: 18.2 % — AB (ref 11.5–15.5)
WBC: 3.8 10*3/uL — ABNORMAL LOW (ref 4.0–10.5)

## 2017-01-18 LAB — MAGNESIUM: MAGNESIUM: 2.1 mg/dL (ref 1.7–2.4)

## 2017-01-18 LAB — BRAIN NATRIURETIC PEPTIDE: B Natriuretic Peptide: 634 pg/mL — ABNORMAL HIGH (ref 0.0–100.0)

## 2017-01-18 LAB — TROPONIN I: TROPONIN I: 0.03 ng/mL — AB (ref <0.03)

## 2017-01-18 MED ORDER — TAMSULOSIN HCL 0.4 MG PO CAPS
0.4000 mg | ORAL_CAPSULE | Freq: Every day | ORAL | Status: DC
  Administered 2017-01-18 – 2017-01-19 (×2): 0.4 mg via ORAL
  Filled 2017-01-18 (×2): qty 1

## 2017-01-18 MED ORDER — NITROGLYCERIN 2 % TD OINT
1.0000 [in_us] | TOPICAL_OINTMENT | Freq: Once | TRANSDERMAL | Status: AC
  Administered 2017-01-18: 1 [in_us] via TOPICAL
  Filled 2017-01-18: qty 1

## 2017-01-18 MED ORDER — POTASSIUM CHLORIDE CRYS ER 20 MEQ PO TBCR
40.0000 meq | EXTENDED_RELEASE_TABLET | Freq: Two times a day (BID) | ORAL | Status: AC
  Administered 2017-01-18 (×2): 40 meq via ORAL
  Filled 2017-01-18 (×2): qty 2

## 2017-01-18 MED ORDER — BUDESONIDE 0.25 MG/2ML IN SUSP
0.2500 mg | Freq: Two times a day (BID) | RESPIRATORY_TRACT | Status: DC
  Administered 2017-01-18 – 2017-01-19 (×2): 0.25 mg via RESPIRATORY_TRACT
  Filled 2017-01-18 (×2): qty 2

## 2017-01-18 MED ORDER — RIVAROXABAN 20 MG PO TABS
20.0000 mg | ORAL_TABLET | Freq: Every day | ORAL | Status: DC
  Administered 2017-01-18: 20 mg via ORAL
  Filled 2017-01-18: qty 1

## 2017-01-18 MED ORDER — METOLAZONE 5 MG PO TABS
2.5000 mg | ORAL_TABLET | Freq: Every day | ORAL | Status: AC
  Administered 2017-01-18 – 2017-01-19 (×2): 2.5 mg via ORAL
  Filled 2017-01-18 (×2): qty 1

## 2017-01-18 MED ORDER — DOCUSATE SODIUM 100 MG PO CAPS
100.0000 mg | ORAL_CAPSULE | Freq: Two times a day (BID) | ORAL | Status: DC
  Administered 2017-01-18 – 2017-01-19 (×3): 100 mg via ORAL
  Filled 2017-01-18 (×3): qty 1

## 2017-01-18 MED ORDER — ALBUTEROL SULFATE (2.5 MG/3ML) 0.083% IN NEBU: 2.5000 mg | INHALATION_SOLUTION | Freq: Four times a day (QID) | RESPIRATORY_TRACT | Status: DC | PRN

## 2017-01-18 MED ORDER — ONDANSETRON HCL 4 MG/2ML IJ SOLN: 4.0000 mg | Freq: Four times a day (QID) | INTRAMUSCULAR | Status: DC | PRN

## 2017-01-18 MED ORDER — SODIUM CHLORIDE 0.9 % IV SOLN: 250.0000 mL | INTRAVENOUS | Status: DC | PRN

## 2017-01-18 MED ORDER — LISINOPRIL 5 MG PO TABS
5.0000 mg | ORAL_TABLET | Freq: Every day | ORAL | Status: DC
  Administered 2017-01-19: 5 mg via ORAL
  Filled 2017-01-18: qty 1

## 2017-01-18 MED ORDER — ACETAMINOPHEN 325 MG PO TABS: 650.0000 mg | ORAL_TABLET | ORAL | Status: DC | PRN

## 2017-01-18 MED ORDER — POTASSIUM CHLORIDE CRYS ER 20 MEQ PO TBCR
40.0000 meq | EXTENDED_RELEASE_TABLET | Freq: Every day | ORAL | Status: DC
  Administered 2017-01-19: 40 meq via ORAL
  Filled 2017-01-18: qty 2

## 2017-01-18 MED ORDER — FUROSEMIDE 10 MG/ML IJ SOLN
80.0000 mg | Freq: Two times a day (BID) | INTRAMUSCULAR | Status: DC
  Administered 2017-01-18 – 2017-01-19 (×2): 80 mg via INTRAVENOUS
  Filled 2017-01-18 (×2): qty 8

## 2017-01-18 MED ORDER — SODIUM CHLORIDE 0.9% FLUSH: 3.0000 mL | INTRAVENOUS | Status: DC | PRN

## 2017-01-18 MED ORDER — CARVEDILOL 12.5 MG PO TABS
25.0000 mg | ORAL_TABLET | Freq: Two times a day (BID) | ORAL | Status: DC
  Administered 2017-01-18 – 2017-01-19 (×2): 25 mg via ORAL
  Filled 2017-01-18 (×2): qty 2

## 2017-01-18 MED ORDER — FERROUS GLUCONATE 324 (38 FE) MG PO TABS
324.0000 mg | ORAL_TABLET | Freq: Two times a day (BID) | ORAL | Status: DC
  Administered 2017-01-18 – 2017-01-19 (×2): 324 mg via ORAL
  Filled 2017-01-18 (×4): qty 1

## 2017-01-18 MED ORDER — SPIRONOLACTONE 25 MG PO TABS
50.0000 mg | ORAL_TABLET | Freq: Every day | ORAL | Status: DC
Start: 2017-01-19 — End: 2017-01-19
  Administered 2017-01-19: 50 mg via ORAL
  Filled 2017-01-18: qty 2

## 2017-01-18 MED ORDER — PANTOPRAZOLE SODIUM 40 MG PO TBEC
40.0000 mg | DELAYED_RELEASE_TABLET | Freq: Every day | ORAL | Status: DC
  Administered 2017-01-18 – 2017-01-19 (×2): 40 mg via ORAL
  Filled 2017-01-18 (×2): qty 1

## 2017-01-18 MED ORDER — ATORVASTATIN CALCIUM 40 MG PO TABS
40.0000 mg | ORAL_TABLET | Freq: Every day | ORAL | Status: DC
  Administered 2017-01-18: 40 mg via ORAL
  Filled 2017-01-18: qty 1

## 2017-01-18 MED ORDER — SODIUM CHLORIDE 0.9% FLUSH
3.0000 mL | Freq: Two times a day (BID) | INTRAVENOUS | Status: DC
  Administered 2017-01-18 – 2017-01-19 (×3): 3 mL via INTRAVENOUS

## 2017-01-18 MED ORDER — ASPIRIN 81 MG PO CHEW: 81.0000 mg | CHEWABLE_TABLET | ORAL | Status: DC | PRN

## 2017-01-18 MED ORDER — BECLOMETHASONE DIPROPIONATE 80 MCG/ACT IN AERS: 2.0000 | INHALATION_SPRAY | Freq: Two times a day (BID) | RESPIRATORY_TRACT | Status: DC

## 2017-01-18 MED ORDER — METOPROLOL TARTRATE 5 MG/5ML IV SOLN
5.0000 mg | Freq: Once | INTRAVENOUS | Status: AC
  Administered 2017-01-18: 5 mg via INTRAVENOUS
  Filled 2017-01-18: qty 5

## 2017-01-18 MED ORDER — FUROSEMIDE 10 MG/ML IJ SOLN
80.0000 mg | Freq: Once | INTRAMUSCULAR | Status: AC
  Administered 2017-01-18: 80 mg via INTRAVENOUS
  Filled 2017-01-18: qty 8

## 2017-01-18 NOTE — ED Provider Notes (Signed)
[facility]-EMERGENCY DEPT Provider Note   CSN: [id number] Arrival date & time: 01/18/17  0756     History   Chief Complaint Chief Complaint  Patient presents with  . Shortness of Breath    HPI [name] is a 51 y.o. male.  HPI Patient presents with shortness of breath over the last few days. History of CHF. Chronic A. fib. Also noncompliance. Noncompliant to levels that he has been dismissed from cardiology. Has been doing worse. Occasional chest pain. No fevers. States his weight is up to 325. States he's been taking his Bumex and sometimes taking double doses. Continues to have issues with his diet compliance. Increased shortness of breath with walking. He has increased swelling in his legs. Past Medical History:  Diagnosis Date  . Allergic rhinitis   . Asthma   . Atrial fibrillation (HCC)   . CHF (congestive heart failure) (HCC)   . Essential hypertension   . Gastroesophageal reflux disease   . Heart attack (HCC)   . History of cardiac catheterization    Normal coronaries December 2016  . Noncompliance    Major problem leading to declining health and recurrent hospitalization  . Nonischemic cardiomyopathy (HCC)    LVEF 20-25%  . OSA (obstructive sleep apnea)   . Osteoarthritis   . Peptic ulcer disease     Patient Active Problem List   Diagnosis Date Noted  . Pedal edema 12/02/2016  . Acute CHF (congestive heart failure) (HCC) 11/09/2016  . Acute on chronic systolic CHF (congestive heart failure) (HCC) 08/19/2016  . CKD (chronic kidney disease), stage II 05/17/2016  . Coronary arteries, normal Dec 2016 01/23/2016  . Chronic anticoagulation-Xarelto 01/23/2016  . NSVT (nonsustained ventricular tachycardia) (HCC) 01/23/2016  . Elevated troponin 12/21/2015  . Nonischemic cardiomyopathy (HCC)   . Morbid obesity due to excess calories (HCC)   . Noncompliance with diet and medication regimen 12/02/2015  . OSA (obstructive sleep apnea) 09/20/2015  . Pulmonary  hypertension (HCC) 09/19/2015  . Normocytic anemia 09/19/2015  . Chronic atrial fibrillation (HCC)   . Dyspnea on exertion 05/28/2015  . Acute respiratory failure with hypoxia (HCC) 04/01/2015  . Hypokalemia 04/01/2015  . Obesity hypoventilation syndrome (HCC) 08/08/2014  . GERD (gastroesophageal reflux disease) 08/08/2014  . Edema of right lower extremity 06/21/2014  . Fecal urgency 06/21/2014  . Asthma 02/11/2009  . Hyperlipidemia 07/12/2008  . OBESITY, MORBID 07/12/2008  . Anxiety state 07/12/2008  . DEPRESSION 07/12/2008  . Hypertension 07/12/2008  . ALLERGIC RHINITIS 07/12/2008  . Peptic ulcer 07/12/2008  . OSTEOARTHRITIS 07/12/2008    Past Surgical History:  Procedure Laterality Date  . CARDIAC CATHETERIZATION N/A 06/14/2015   Procedure: Right/Left Heart Cath and Coronary Angiography;  Surgeon: Runell Gess, MD;  Location: [facility] INVASIVE CV LAB;  Service: Cardiovascular;  Laterality: N/A;       Home Medications    Prior to Admission medications   Medication Sig Start Date End Date Taking? Authorizing Provider  albuterol (PROVENTIL) (2.5 MG/3ML) 0.083% nebulizer solution Take 2.5 mg by nebulization every 6 (six) hours as needed for wheezing or shortness of breath.   Yes [provider]  aspirin 81 MG chewable tablet Chew 81 mg by mouth as needed for mild pain or moderate pain.    Yes [provider]  atorvastatin (LIPITOR) 40 MG tablet Take 1 tablet (40 mg total) by mouth daily at 6 PM. 06/25/16  Yes Kathlen Mody, MD  beclomethasone (QVAR) 80 MCG/ACT inhaler Inhale 2 puffs into the lungs 2 (  two) times daily.    Yes [provider]  bumetanide (BUMEX) 2 MG tablet Take 1 tablet (2 mg total) by mouth 2 (two) times daily. 11/13/16  Yes Tat, Onalee Hua, MD  carvedilol (COREG) 25 MG tablet Take 1 tablet (25 mg total) by mouth 2 (two) times daily with a meal. 11/13/16  Yes Tat, Onalee Hua, MD  docusate sodium (COLACE) 100 MG capsule Take 1 capsule (100 mg total)  by mouth 2 (two) times daily. Patient taking differently: Take 100 mg by mouth 2 (two) times daily as needed for mild constipation.  06/25/16  Yes Kathlen Mody, MD  ferrous gluconate (FERGON) 324 MG tablet Take 1 tablet (324 mg total) by mouth 2 (two) times daily with a meal. Patient taking differently: Take 324 mg by mouth daily with breakfast.  06/25/16  Yes Kathlen Mody, MD  furosemide (LASIX) 80 MG tablet Take 1 tablet (80 mg total) by mouth 2 (two) times daily. Patient taking differently: Take 80 mg by mouth 2 (two) times daily as needed for fluid.  12/06/16  Yes Philip Aspen, Limmie Patricia, MD  ibuprofen (ADVIL,MOTRIN) 200 MG tablet Take 400 mg by mouth every 6 (six) hours as needed for mild pain or moderate pain.   Yes [provider]  lisinopril (PRINIVIL,ZESTRIL) 5 MG tablet Take 1 tablet (5 mg total) by mouth daily. 11/13/16  Yes Tat, Onalee Hua, MD  pantoprazole (PROTONIX) 40 MG tablet Take 1 tablet (40 mg total) by mouth daily at 6 (six) AM. Patient taking differently: Take 40 mg by mouth every evening.  06/26/16  Yes Kathlen Mody, MD  potassium chloride SA (K-DUR,KLOR-CON) 20 MEQ tablet Take 2 tablets (40 mEq total) by mouth daily. Patient taking differently: Take 10-40 mEq by mouth daily.  10/27/16  Yes Filbert Schilder, MD  rivaroxaban (XARELTO) 20 MG TABS tablet Take 1 tablet (20 mg total) by mouth daily with breakfast. Patient taking differently: Take 20 mg by mouth daily with supper.  06/26/16  Yes Kathlen Mody, MD  spironolactone (ALDACTONE) 50 MG tablet Take 1 tablet (50 mg total) by mouth daily. 10/02/16  Yes Houston Siren, MD  tamsulosin (FLOMAX) 0.4 MG CAPS capsule Take 1 capsule (0.4 mg total) by mouth daily. 06/25/16  Yes Kathlen Mody, MD    Family History Family History  Problem Relation Age of Onset  . Stroke Father   . Heart attack Father   . Aneurysm Mother        Cerebral aneurysm  . Hypertension Sister   . Colon cancer Neg Hx   . Inflammatory bowel disease Neg Hx     . Liver disease Neg Hx     Social History Social History  Substance Use Topics  . Smoking status: Former Smoker    Packs/day: 0.50    Years: 20.00    Types: Cigarettes    Start date: 04/26/1988    Quit date: 06/23/2007  . Smokeless tobacco: Never Used     Comment: 1 ppd former smoker  . Alcohol use No     Comment: No etoh since 2009     Allergies   Banana; Bee venom; Food; Lipitor [atorvastatin]; Aspirin; Metolazone; Oatmeal; Orange juice [orange oil]; Torsemide; Diltiazem; and Hydralazine   Review of Systems Review of Systems  Constitutional: Negative for appetite change.  HENT: Negative for congestion.   Eyes: Negative for photophobia.  Respiratory: Positive for shortness of breath.   Cardiovascular: Positive for chest pain and leg swelling.  Gastrointestinal: Negative for abdominal pain.  Genitourinary:  Negative for frequency.  Musculoskeletal: Negative for back pain.  Skin: Negative for rash.  Neurological: Negative for tremors.  Psychiatric/Behavioral: Negative for confusion.     Physical Exam Updated Vital Signs BP (!) 159/113   Pulse 94   Temp 98.4 F (36.9 C) (Oral)   Resp 20   Ht 6' (1.829 m)   Wt (!) 151.2 kg (333 lb 6.4 oz)   SpO2 97%   BMI 45.22 kg/m   Physical Exam  Constitutional: He appears well-developed.  HENT:  Head: Normocephalic.  Eyes: Pupils are equal, round, and reactive to light.  Neck: No JVD present.  Cardiovascular: Normal rate.   Pulmonary/Chest: He has rales.  Rales in bilateral bases  Abdominal: There is no tenderness.  Musculoskeletal: He exhibits edema.  Pitting and chronic edema bilateral lower extremities.  Neurological: He is alert.  Skin: Skin is warm. Capillary refill takes less than 2 seconds.  Psychiatric: He has a normal mood and affect.     ED Treatments / Results  Labs (all labs ordered are listed, but only abnormal results are displayed) Labs Reviewed  COMPREHENSIVE METABOLIC PANEL - Abnormal; Notable  for the following:       Result Value   Potassium 3.3 (*)    Glucose, Bld 109 (*)    Calcium 8.7 (*)    Total Bilirubin 1.3 (*)    All other components within normal limits  CBC WITH DIFFERENTIAL/PLATELET - Abnormal; Notable for the following:    WBC 3.8 (*)    Hemoglobin 10.7 (*)    HCT 34.5 (*)    MCH 25.2 (*)    RDW 18.2 (*)    All other components within normal limits  TROPONIN I - Abnormal; Notable for the following:    Troponin I 0.03 (*)    All other components within normal limits  MAGNESIUM  BRAIN NATRIURETIC PEPTIDE    EKG  EKG Interpretation  Date/Time:  Monday January 18 2017 07:59:11 EDT Ventricular Rate:  96 PR Interval:    QRS Duration: 92 QT Interval:  387 QTC Calculation: 490 R Axis:   58 Text Interpretation:  Atrial fibrillation Ventricular premature complex Nonspecific T abnormalities, lateral leads Borderline prolonged QT interval Confirmed by Benjiman Core [phone]) on 01/18/2017 8:09:54 AM       Radiology Dg Chest 2 View  Result Date: 01/18/2017 CLINICAL DATA:  Increasing shortness of breath. EXAM: CHEST  2 VIEW COMPARISON:  01/09/2017 and 01/07/2017 and 09/05/2016 FINDINGS: Chronic cardiomegaly. Increased pulmonary vascularity since the prior study. No discrete pulmonary edema or effusions. Bones appear normal. IMPRESSION: Increasing pulmonary vascular congestion.  Chronic cardiomegaly. Electronically Signed   By: Francene Boyers M.D.   On: 01/18/2017 09:04    Procedures Procedures (including critical care time)  Medications Ordered in ED Medications  furosemide (LASIX) injection 80 mg (80 mg Intravenous Given by Other 01/18/17 0827)     Initial Impression / Assessment and Plan / ED Course  I have reviewed the triage vital signs and the nursing notes.  Pertinent labs & imaging results that were available during my care of the patient were reviewed by me and considered in my medical decision making (see chart for details).    Patient resents  her shortness of breath. History same. Has had questionable compliance with his diet for CHF. Weight is up 30 pounds from recent admission. Has had recent ER visit also with diuresis in the ER. I think this point he would benefit from admission for further diuresis and  management.*  Final Clinical Impressions(s) / ED Diagnoses   Final diagnoses:  Acute on chronic congestive heart failure, unspecified heart failure type [facility])    New Prescriptions New Prescriptions   No medications on file     Benjiman Core, MD 01/18/17 [phone]

## 2017-01-18 NOTE — ED Triage Notes (Signed)
Pt brought in by [facility] with c/o SOB over the past couple of days. Hx of CHF. EMS reports lung sounds clear, O2 sats 98% on RA. Pt was placed on O2 via [place] at 2L for comfort, pt reports this has improved his SOB. Pt has BLE edema, worse on right side. Pt reports he takes Bumex and Aldactone, which he has been taking, but states "it makes my swelling worse". Denies pain.

## 2017-01-18 NOTE — ED Notes (Signed)
CRITICAL VALUE ALERT  Critical Value:  Troponin - 0.03  Date & Time Notied:  01/18/2017  0932  Provider Notified: Dr Rubin Payor  Orders Received/Actions taken:

## 2017-01-18 NOTE — H&P (Signed)
History and Physical    BAXTER GONZALEZ ZOX:[id number] DOB: April 16, 1966 DOA: 01/18/2017  PCP: Pearson Grippe, MD  Patient coming from: home  I have personally briefly reviewed patient's old medical records in [facility]  Chief Complaint: Dyspnea  HPI: [name] is a 51 y.o. male with medical history significant of systolic heart failure with an EF of 35% with diffuse hypokinesia, pulmonary pressures of 56 by echocardiogram in 2018, atrial fibrillation on Xarelto noncompliant with his medication comes in for dyspnea on session and PND. He denies any chest pain nausea vomiting or diarrhea. He denies any fevers at home. His estimated dry weight is around 135 kg. In the ED he was 151 kg.   ED Course:  Blood pressure is elevated at 158/113, he is mildly hypokalemic with mild leukopenia 3.9 normocytic anemia of 10.7 a chest x-ray that shows mild pulmonary edema.  Review of Systems: As per HPI otherwise 10 point review of systems negative.    Past Medical History:  Diagnosis Date  . Allergic rhinitis   . Asthma   . Atrial fibrillation (HCC)   . CHF (congestive heart failure) (HCC)   . Essential hypertension   . Gastroesophageal reflux disease   . Heart attack (HCC)   . History of cardiac catheterization    Normal coronaries December 2016  . Noncompliance    Major problem leading to declining health and recurrent hospitalization  . Nonischemic cardiomyopathy (HCC)    LVEF 20-25%  . OSA (obstructive sleep apnea)   . Osteoarthritis   . Peptic ulcer disease     Past Surgical History:  Procedure Laterality Date  . CARDIAC CATHETERIZATION N/A 06/14/2015   Procedure: Right/Left Heart Cath and Coronary Angiography;  Surgeon: Runell Gess, MD;  Location: [facility] [address] INVASIVE CV LAB;  Service: Cardiovascular;  Laterality: N/A;     reports that he quit smoking about 9 years ago. His smoking use included Cigarettes. He started smoking about 28 years ago. He has a 10.00 pack-year smoking  history. He has never used smokeless tobacco. He reports that he does not drink alcohol or use drugs.  Allergies  Allergen Reactions  . Banana Shortness Of Breath  . Bee Venom Shortness Of Breath, Swelling and Other (See Comments)    Reaction:  Facial swelling  . Food Shortness Of Breath, Rash and Other (See Comments)    Pt states that he is allergic to strawberries.    . Lipitor [Atorvastatin] Shortness Of Breath and Other (See Comments)    Reaction:  Nose bleeds   . Aspirin Other (See Comments)    Reaction:  GI upset   . Metolazone Other (See Comments)    Pt states that he stopped taking this med due to heart attack like symptoms.   . Oatmeal Nausea And Vomiting  . Orange Juice [Orange Oil] Nausea And Vomiting    All acidic products make him nauseous and upset stomach  . Torsemide Swelling and Other (See Comments)    Reaction:  Swelling of feet/legs   . Diltiazem Palpitations  . Hydralazine Palpitations    Family History  Problem Relation Age of Onset  . Stroke Father   . Heart attack Father   . Aneurysm Mother        Cerebral aneurysm  . Hypertension Sister   . Colon cancer Neg Hx   . Inflammatory bowel disease Neg Hx   . Liver disease Neg Hx     Prior to Admission medications   Medication Sig  Start Date End Date Taking? Authorizing Provider  albuterol (PROVENTIL) (2.5 MG/3ML) 0.083% nebulizer solution Take 2.5 mg by nebulization every 6 (six) hours as needed for wheezing or shortness of breath.   Yes [provider]  aspirin 81 MG chewable tablet Chew 81 mg by mouth as needed for mild pain or moderate pain.    Yes [provider]  atorvastatin (LIPITOR) 40 MG tablet Take 1 tablet (40 mg total) by mouth daily at 6 PM. 06/25/16  Yes Kathlen Mody, MD  beclomethasone (QVAR) 80 MCG/ACT inhaler Inhale 2 puffs into the lungs 2 (two) times daily.    Yes [provider]  bumetanide (BUMEX) 2 MG tablet Take 1 tablet (2 mg total) by mouth 2 (two) times  daily. 11/13/16  Yes Tat, Onalee Hua, MD  carvedilol (COREG) 25 MG tablet Take 1 tablet (25 mg total) by mouth 2 (two) times daily with a meal. 11/13/16  Yes Tat, Onalee Hua, MD  docusate sodium (COLACE) 100 MG capsule Take 1 capsule (100 mg total) by mouth 2 (two) times daily. Patient taking differently: Take 100 mg by mouth 2 (two) times daily as needed for mild constipation.  06/25/16  Yes Kathlen Mody, MD  ferrous gluconate (FERGON) 324 MG tablet Take 1 tablet (324 mg total) by mouth 2 (two) times daily with a meal. Patient taking differently: Take 324 mg by mouth daily with breakfast.  06/25/16  Yes Kathlen Mody, MD  furosemide (LASIX) 80 MG tablet Take 1 tablet (80 mg total) by mouth 2 (two) times daily. Patient taking differently: Take 80 mg by mouth 2 (two) times daily as needed for fluid.  12/06/16  Yes Philip Aspen, Limmie Patricia, MD  ibuprofen (ADVIL,MOTRIN) 200 MG tablet Take 400 mg by mouth every 6 (six) hours as needed for mild pain or moderate pain.   Yes [provider]  lisinopril (PRINIVIL,ZESTRIL) 5 MG tablet Take 1 tablet (5 mg total) by mouth daily. 11/13/16  Yes Tat, Onalee Hua, MD  pantoprazole (PROTONIX) 40 MG tablet Take 1 tablet (40 mg total) by mouth daily at 6 (six) AM. Patient taking differently: Take 40 mg by mouth every evening.  06/26/16  Yes Kathlen Mody, MD  potassium chloride SA (K-DUR,KLOR-CON) 20 MEQ tablet Take 2 tablets (40 mEq total) by mouth daily. Patient taking differently: Take 10-40 mEq by mouth daily.  10/27/16  Yes Filbert Schilder, MD  rivaroxaban (XARELTO) 20 MG TABS tablet Take 1 tablet (20 mg total) by mouth daily with breakfast. Patient taking differently: Take 20 mg by mouth daily with supper.  06/26/16  Yes Kathlen Mody, MD  spironolactone (ALDACTONE) 50 MG tablet Take 1 tablet (50 mg total) by mouth daily. 10/02/16  Yes Houston Siren, MD  tamsulosin (FLOMAX) 0.4 MG CAPS capsule Take 1 capsule (0.4 mg total) by mouth daily. 06/25/16  Yes Kathlen Mody, MD     Physical Exam: Vitals:   01/18/17 0900 01/18/17 0930 01/18/17 1000 01/18/17 1030  BP: 113/83 (!) 156/118 (!) 168/131 (!) 158/113  Pulse: 94 (!) 112 96 (!) 58  Resp: (!) 25 (!) 22 14 20   Temp:      TempSrc:      SpO2: 100% 100% 100% 99%  Weight:      Height:        Constitutional: NAD, calm, comfortable Vitals:   01/18/17 0900 01/18/17 0930 01/18/17 1000 01/18/17 1030  BP: 113/83 (!) 156/118 (!) 168/131 (!) 158/113  Pulse: 94 (!) 112 96 (!) 58  Resp: (!) 25 (!)  22 14 20   Temp:      TempSrc:      SpO2: 100% 100% 100% 99%  Weight:      Height:       Eyes: Pupils equally round and reactive to light. ENMT: Mucous membranes are moist, poor dentition. Neck: Neck is supple with positive JVD. Respiratory: He has good air movement with crackles on the right lower lung. Cardiovascular: The regular rhythm with positive S1-S2 3+ edema in his lower extremities. Abdomen: Positive bowel sounds soft nontender nondistended. Musculoskeletal: No clubbing cyanosis or edema. Skin: No rashes. Neurologic: 3-12 are grossly intact. Psychiatric: Judgment and insight normal, alert oriented 3.  Labs on Admission: I have personally reviewed following labs and imaging studies  CBC:  Recent Labs Lab 01/18/17 0835  WBC 3.8*  NEUTROABS 2.6  HGB 10.7*  HCT 34.5*  MCV 81.2  PLT 185   Basic Metabolic Panel:  Recent Labs Lab 01/18/17 0835  NA 138  K 3.3*  CL 107  CO2 24  GLUCOSE 109*  BUN 15  CREATININE 1.01  CALCIUM 8.7*  MG 2.1   GFR: Estimated Creatinine Clearance: 135.4 mL/min (by C-G formula based on SCr of 1.01 mg/dL). Liver Function Tests:  Recent Labs Lab 01/18/17 0835  AST 29  ALT 23  ALKPHOS 79  BILITOT 1.3*  PROT 6.7  ALBUMIN 3.6   No results for input(s): LIPASE, AMYLASE in the last 168 hours. No results for input(s): AMMONIA in the last 168 hours. Coagulation Profile: No results for input(s): INR, PROTIME in the last 168 hours. Cardiac  Enzymes:  Recent Labs Lab 01/18/17 0835  TROPONINI 0.03*   BNP (last 3 results) No results for input(s): PROBNP in the last 8760 hours. HbA1C: No results for input(s): HGBA1C in the last 72 hours. CBG: No results for input(s): GLUCAP in the last 168 hours. Lipid Profile: No results for input(s): CHOL, HDL, LDLCALC, TRIG, CHOLHDL, LDLDIRECT in the last 72 hours. Thyroid Function Tests: No results for input(s): TSH, T4TOTAL, FREET4, T3FREE, THYROIDAB in the last 72 hours. Anemia Panel: No results for input(s): VITAMINB12, FOLATE, FERRITIN, TIBC, IRON, RETICCTPCT in the last 72 hours. Urine analysis:    Component Value Date/Time   COLORURINE AMBER (A) 10/24/2016 0923   APPEARANCEUR CLEAR 10/24/2016 0923   LABSPEC 1.027 10/24/2016 0923   PHURINE 5.0 10/24/2016 0923   GLUCOSEU NEGATIVE 10/24/2016 0923   GLUCOSEU NEG mg/dL 46/95/0722 5750   HGBUR NEGATIVE 10/24/2016 0923   BILIRUBINUR NEGATIVE 10/24/2016 0923   KETONESUR NEGATIVE 10/24/2016 0923   PROTEINUR 100 (A) 10/24/2016 0923   UROBILINOGEN 0.2 08/08/2014 1445   NITRITE NEGATIVE 10/24/2016 0923   LEUKOCYTESUR NEGATIVE 10/24/2016 0923    Radiological Exams on Admission: Dg Chest 2 View  Result Date: 01/18/2017 CLINICAL DATA:  Increasing shortness of breath. EXAM: CHEST  2 VIEW COMPARISON:  01/09/2017 and 01/07/2017 and 09/05/2016 FINDINGS: Chronic cardiomegaly. Increased pulmonary vascularity since the prior study. No discrete pulmonary edema or effusions. Bones appear normal. IMPRESSION: Increasing pulmonary vascular congestion.  Chronic cardiomegaly. Electronically Signed   By: Francene Boyers M.D.   On: 01/18/2017 09:04    EKG: Independently reviewed. Atrial fibrillation, rate controlled, normal axis and nonspecific T-wave changes.  Assessment/Plan Acute systolic heart failure (HCC): Positive JVD 3+ lower extremity edema with elevated BNP and a chest x-ray and physical exam compatible with pulmonary edema. We'll start  him on aggressive IV lasix and metolazone. Continue spironolactone. Estimated dry weight is around 135 kg, on admission she was  151.  Essential hypertension Blood pressure is high likely due to volume overload. We'll continue current home meds increase her Lasix. Edema of right lower extremity  Hypokalemia: Unclear etiology we'll replete orally continue Aldactone recheck in the morning  Chronic atrial fibrillation [facility]): Rate control continue Coreg and Xarelto.  Noncompliance with diet and medication regimen  Elevated troponin He denies any chest pain, only limited likely due to acute decompensated diastolic heart failure.  CKD (chronic kidney disease), stage II Seems at baseline.    DVT prophylaxis: Xarelto Code Status: full Family Communication: None Consults called: None Admission status: inpatient   Marinda Elk MD [facility] Pager [phone]  If 7PM-7AM, please contact night-coverage [email or web address] Password [email or web address]  01/18/2017, 10:56 AM

## 2017-01-18 NOTE — ED Notes (Signed)
ED Provider at bedside. 

## 2017-01-19 DIAGNOSIS — N182 Chronic kidney disease, stage 2 (mild): Secondary | ICD-10-CM | POA: Diagnosis not present

## 2017-01-19 DIAGNOSIS — I130 Hypertensive heart and chronic kidney disease with heart failure and stage 1 through stage 4 chronic kidney disease, or unspecified chronic kidney disease: Secondary | ICD-10-CM | POA: Diagnosis not present

## 2017-01-19 DIAGNOSIS — I482 Chronic atrial fibrillation: Secondary | ICD-10-CM

## 2017-01-19 DIAGNOSIS — I509 Heart failure, unspecified: Secondary | ICD-10-CM

## 2017-01-19 LAB — BASIC METABOLIC PANEL
ANION GAP: 10 (ref 5–15)
BUN: 17 mg/dL (ref 6–20)
CALCIUM: 8.8 mg/dL — AB (ref 8.9–10.3)
CO2: 28 mmol/L (ref 22–32)
Chloride: 106 mmol/L (ref 101–111)
Creatinine, Ser: 1.14 mg/dL (ref 0.61–1.24)
GFR calc Af Amer: >60 mL/min (ref >60)
GFR calc non Af Amer: >60 mL/min (ref >60)
GLUCOSE: 90 mg/dL (ref 65–99)
Potassium: 3.4 mmol/L — ABNORMAL LOW (ref 3.5–5.1)
Sodium: 144 mmol/L (ref 135–145)

## 2017-01-19 IMAGING — CR DG CHEST 1V PORT
1 series · 1 of 1 positions shown · non-contrast
Comparison: May 13, 2015

CLINICAL DATA: Shortness of Breath

EXAM:
PORTABLE CHEST 1 VIEW

[ap]
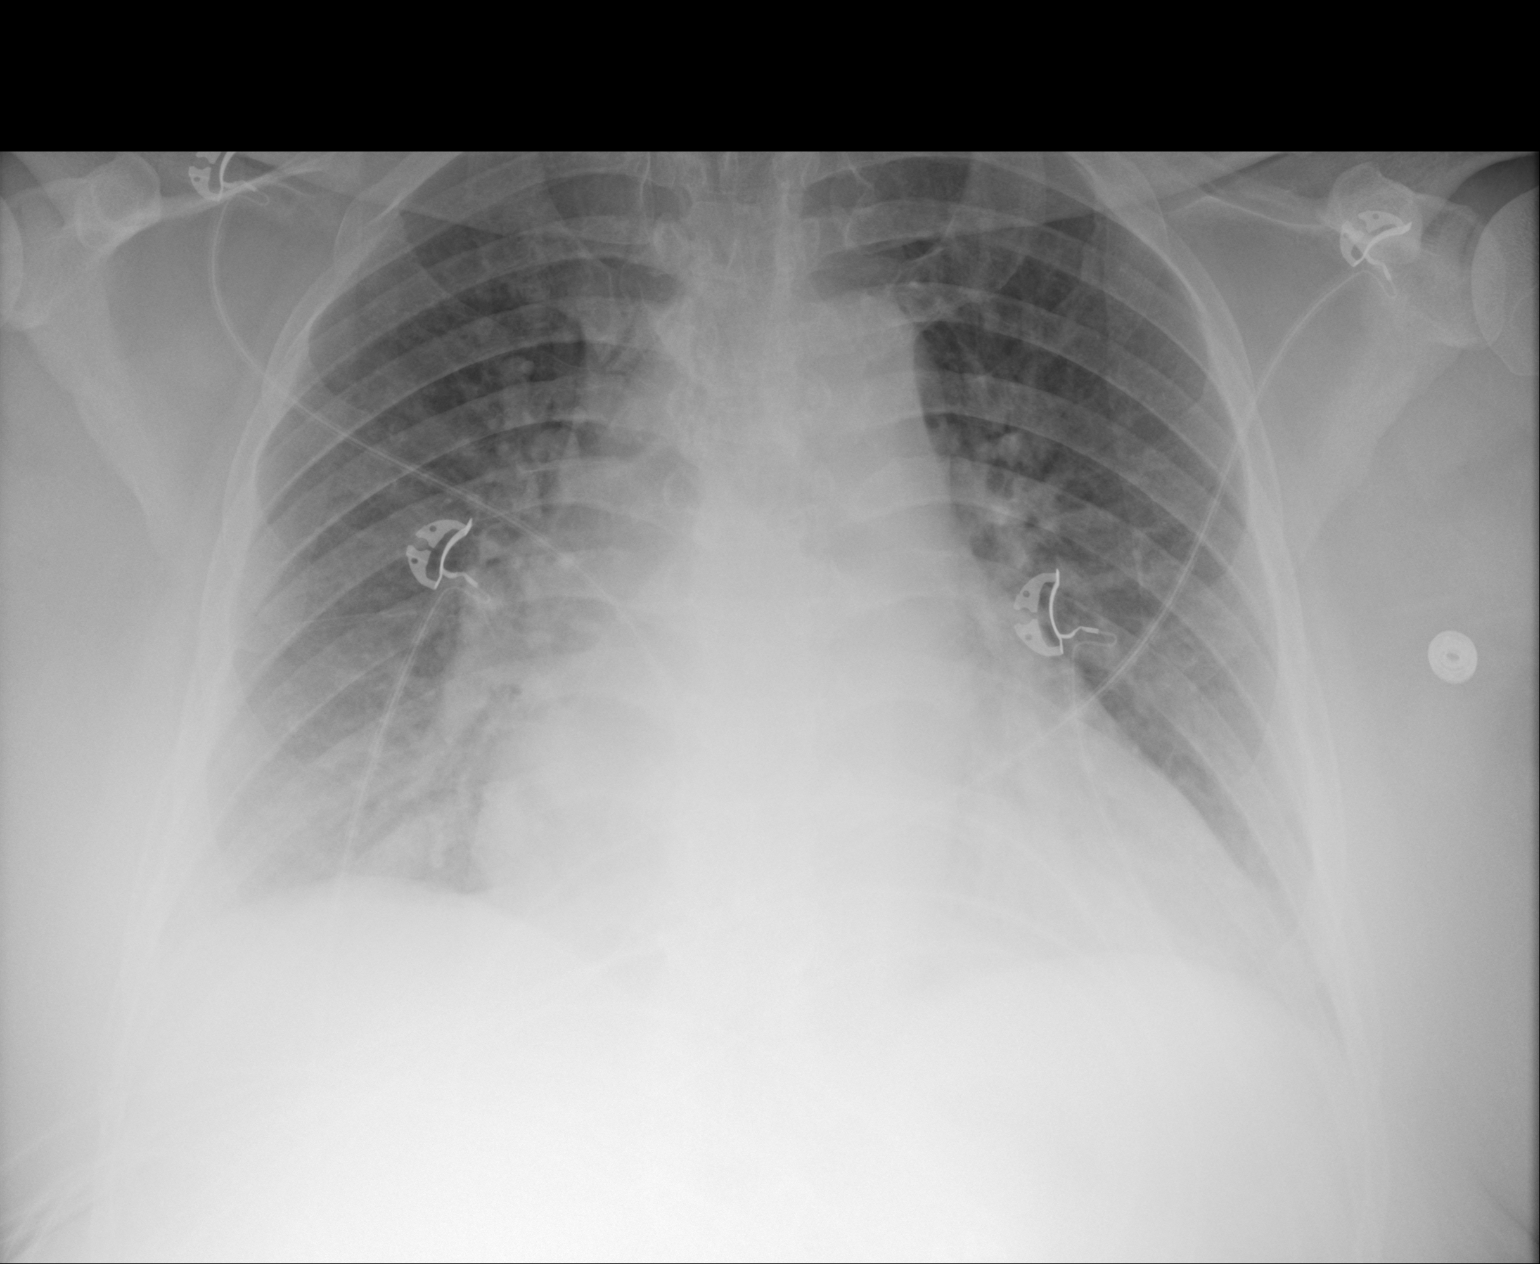

[1 of 1 positions shown; findings below may reference images not displayed]

FINDINGS: There is stable cardiomegaly with borderline pulmonary venous
hypertension. No frank edema or consolidation. No adenopathy. No
bone lesions.
IMPRESSION: Persistent pulmonary vascular congestion without edema or
consolidation.

## 2017-01-19 NOTE — Progress Notes (Signed)
Discharge instructions printed and reviewed with patient, and copy given for them to take home. All questions addressed at this time. IV and telemetry box removed. Room searched for patient belongings, and confirmed with patient that all valuables were accounted for. Patient to discharge home once his family arrives to pick him up. Continue to monitor until time of discharge.

## 2017-01-19 NOTE — Discharge Summary (Signed)
Physician Discharge Summary  [name]:[id number] DOB: 26-Aug-1965 DOA: 01/18/2017  PCP: Pearson Grippe, MD  Admit date: 01/18/2017 Discharge date: 01/19/2017  Admitted From: Home.  Disposition:  Home.   Recommendations for Outpatient Follow-up:  1. Follow up with PCP in 1-2 weeks 2. Follow up with cardiology as scheduled.  Home Health: None.  Equipment/Devices: None.  Discharge Condition: No SOB.  Ambulate without DOE.  CODE STATUS: FULL CODE.  Diet recommendation: Cardiac diet.   Brief/Interim Summary: Patient was admitted for acute on chronic systolic CHF by Dr David Stall on July 30th, 2018.  As per his H and P:  " [name] is a 51 y.o. male with medical history significant of systolic heart failure with an EF of 35% with diffuse hypokinesia, pulmonary pressures of 56 by echocardiogram in 2018, atrial fibrillation on Xarelto noncompliant with his medication comes in for dyspnea on session and PND. He denies any chest pain nausea vomiting or diarrhea. He denies any fevers at home. His estimated dry weight is around 135 kg. In the ED he was 151 kg.   ED Course:  Blood pressure is elevated at 158/113, he is mildly hypokalemic with mild leukopenia 3.9 normocytic anemia of 10.7 a chest x-ray that shows mild pulmonary edema.  HOSPITAL COURSE:  Patient has had several admissions for acute CHF, likely as he is non compliance with his medication.  With IV Lasix, he diused about 4 to 5 liters and felt markedly better.  He has no light headednes.  His K and Cr remained stable.  His troponin was at 0.03.  He felt markedly better and will be discharged to home today.  He will follow up with his PCP next week, and with his cardiology as scheduled.   Discharge Diagnoses:  Active Problems:   Essential hypertension   Edema of right lower extremity   Hypokalemia   Chronic atrial fibrillation (HCC)   Noncompliance with diet and medication regimen   Elevated troponin   CKD (chronic kidney  disease), stage II   Acute systolic heart failure [facility])    Discharge Instructions  Discharge Instructions    Diet - low sodium heart healthy    Complete by:  As directed    Increase activity slowly    Complete by:  As directed      Allergies as of 01/19/2017      Reactions   Banana Shortness Of Breath   Bee Venom Shortness Of Breath, Swelling, Other (See Comments)   Reaction:  Facial swelling   Food Shortness Of Breath, Rash, Other (See Comments)   Pt states that he is allergic to strawberries.     Lipitor [atorvastatin] Shortness Of Breath, Other (See Comments)   Reaction:  Nose bleeds    Aspirin Other (See Comments)   Reaction:  GI upset    Metolazone Other (See Comments)   Pt states that he stopped taking this med due to heart attack like symptoms.    Oatmeal Nausea And Vomiting   Orange Juice [orange Oil] Nausea And Vomiting   All acidic products make him nauseous and upset stomach   Torsemide Swelling, Other (See Comments)   Reaction:  Swelling of feet/legs    Diltiazem Palpitations   Hydralazine Palpitations      Medication List    STOP taking these medications   ibuprofen 200 MG tablet Commonly known as:  ADVIL,MOTRIN   lisinopril 5 MG tablet Commonly known as:  PRINIVIL,ZESTRIL     TAKE these  medications   albuterol (2.5 MG/3ML) 0.083% nebulizer solution Commonly known as:  PROVENTIL Take 2.5 mg by nebulization every 6 (six) hours as needed for wheezing or shortness of breath.   aspirin 81 MG chewable tablet Chew 81 mg by mouth as needed for mild pain or moderate pain.   atorvastatin 40 MG tablet Commonly known as:  LIPITOR Take 1 tablet (40 mg total) by mouth daily at 6 PM.   beclomethasone 80 MCG/ACT inhaler Commonly known as:  QVAR Inhale 2 puffs into the lungs 2 (two) times daily.   bumetanide 2 MG tablet Commonly known as:  BUMEX Take 1 tablet (2 mg total) by mouth 2 (two) times daily.   carvedilol 25 MG tablet Commonly known as:   COREG Take 1 tablet (25 mg total) by mouth 2 (two) times daily with a meal.   docusate sodium 100 MG capsule Commonly known as:  COLACE Take 1 capsule (100 mg total) by mouth 2 (two) times daily. What changed:  when to take this  reasons to take this   ferrous gluconate 324 MG tablet Commonly known as:  FERGON Take 1 tablet (324 mg total) by mouth 2 (two) times daily with a meal. What changed:  when to take this   furosemide 80 MG tablet Commonly known as:  LASIX Take 1 tablet (80 mg total) by mouth 2 (two) times daily. What changed:  when to take this  reasons to take this   pantoprazole 40 MG tablet Commonly known as:  PROTONIX Take 1 tablet (40 mg total) by mouth daily at 6 (six) AM. What changed:  when to take this   potassium chloride SA 20 MEQ tablet Commonly known as:  K-DUR,KLOR-CON Take 2 tablets (40 mEq total) by mouth daily. What changed:  how much to take   rivaroxaban 20 MG Tabs tablet Commonly known as:  XARELTO Take 1 tablet (20 mg total) by mouth daily with breakfast. What changed:  when to take this   spironolactone 50 MG tablet Commonly known as:  ALDACTONE Take 1 tablet (50 mg total) by mouth daily.   tamsulosin 0.4 MG Caps capsule Commonly known as:  FLOMAX Take 1 capsule (0.4 mg total) by mouth daily.       Allergies  Allergen Reactions  . Banana Shortness Of Breath  . Bee Venom Shortness Of Breath, Swelling and Other (See Comments)    Reaction:  Facial swelling  . Food Shortness Of Breath, Rash and Other (See Comments)    Pt states that he is allergic to strawberries.    . Lipitor [Atorvastatin] Shortness Of Breath and Other (See Comments)    Reaction:  Nose bleeds   . Aspirin Other (See Comments)    Reaction:  GI upset   . Metolazone Other (See Comments)    Pt states that he stopped taking this med due to heart attack like symptoms.   . Oatmeal Nausea And Vomiting  . Orange Juice [Orange Oil] Nausea And Vomiting    All acidic  products make him nauseous and upset stomach  . Torsemide Swelling and Other (See Comments)    Reaction:  Swelling of feet/legs   . Diltiazem Palpitations  . Hydralazine Palpitations    Consultations:  None.    Procedures/Studies: Dg Chest 2 View  Result Date: 01/18/2017 CLINICAL DATA:  Increasing shortness of breath. EXAM: CHEST  2 VIEW COMPARISON:  01/09/2017 and 01/07/2017 and 09/05/2016 FINDINGS: Chronic cardiomegaly. Increased pulmonary vascularity since the prior study. No discrete pulmonary  edema or effusions. Bones appear normal. IMPRESSION: Increasing pulmonary vascular congestion.  Chronic cardiomegaly. Electronically Signed   By: Francene Boyers M.D.   On: 01/18/2017 09:04   Dg Chest 2 View  Result Date: 01/07/2017 CLINICAL DATA:  Chest pain EXAM: CHEST  2 VIEW COMPARISON:  Four days ago FINDINGS: Cardiomegaly and vascular congestion. No Kerley lines or air bronchogram. Mild haziness of lower lung zones, chronic and accentuated by patient size. No effusion or pneumothorax. IMPRESSION: Baseline appearance of the chest. Cardiomegaly and vascular congestion. Electronically Signed   By: Marnee Spring M.D.   On: 01/07/2017 07:08   Dg Chest 2 View  Result Date: 01/03/2017 CLINICAL DATA:  Chest pain and shortness of breath since yesterday. EXAM: CHEST  2 VIEW COMPARISON:  12/24/2016. FINDINGS: Stable enlarged cardiac silhouette and prominent pulmonary vasculature. The interstitial markings are mildly prominent with improvement. No pleural fluid. Unremarkable bones. IMPRESSION: Cardiomegaly, pulmonary vascular congestion and mild chronic interstitial lung disease. Electronically Signed   By: Beckie Salts M.D.   On: 01/03/2017 08:28     Subjective:  I feel better.   Discharge Exam: Vitals:   01/19/17 0528 01/19/17 1428  BP: (!) 142/96 117/78  Pulse: 81 86  Resp: 20 18  Temp: 98.5 F (36.9 C) 98.2 F (36.8 C)   Vitals:   01/18/17 2112 01/19/17 0528 01/19/17 0818 01/19/17  1428  BP: (!) 118/92 (!) 142/96  117/78  Pulse: 95 81  86  Resp: 20 20  18   Temp: 97.9 F (36.6 C) 98.5 F (36.9 C)  98.2 F (36.8 C)  TempSrc: Oral Oral  Oral  SpO2: 95% 100% 99% 100%  Weight:  (!) 143.2 kg (315 lb 12.8 oz)    Height:        General: Pt is alert, awake, not in acute distress Cardiovascular: RRR, S1/S2 +, no rubs, no gallops Respiratory: CTA bilaterally, no wheezing, no rhonchi Abdominal: Soft, NT, ND, bowel sounds + Extremities: no edema, no cyanosis    The results of significant diagnostics from this hospitalization (including imaging, microbiology, ancillary and laboratory) are listed below for reference.     Labs: BNP (last 3 results)  Recent Labs  12/02/16 2055 01/07/17 0602 01/18/17 0835  BNP 703.0* 777.0* 634.0*   Basic Metabolic Panel:  Recent Labs Lab 01/18/17 0835 01/19/17 0540  NA 138 144  K 3.3* 3.4*  CL 107 106  CO2 24 28  GLUCOSE 109* 90  BUN 15 17  CREATININE 1.01 1.14  CALCIUM 8.7* 8.8*  MG 2.1  --    Liver Function Tests:  Recent Labs Lab 01/18/17 0835  AST 29  ALT 23  ALKPHOS 79  BILITOT 1.3*  PROT 6.7  ALBUMIN 3.6   CBC:  Recent Labs Lab 01/18/17 0835  WBC 3.8*  NEUTROABS 2.6  HGB 10.7*  HCT 34.5*  MCV 81.2  PLT 185   Cardiac Enzymes:  Recent Labs Lab 01/18/17 0835  TROPONINI 0.03*   Urinalysis    Component Value Date/Time   COLORURINE AMBER (A) 10/24/2016 0923   APPEARANCEUR CLEAR 10/24/2016 0923   LABSPEC 1.027 10/24/2016 0923   PHURINE 5.0 10/24/2016 0923   GLUCOSEU NEGATIVE 10/24/2016 0923   GLUCOSEU NEG mg/dL 08/02/1733 6701   HGBUR NEGATIVE 10/24/2016 0923   BILIRUBINUR NEGATIVE 10/24/2016 0923   KETONESUR NEGATIVE 10/24/2016 0923   PROTEINUR 100 (A) 10/24/2016 0923   UROBILINOGEN 0.2 08/08/2014 1445   NITRITE NEGATIVE 10/24/2016 0923   LEUKOCYTESUR NEGATIVE 10/24/2016 4103  Time coordinating discharge: Over 30 minutes SIGNED:  Houston Siren, MD FACP [facility] 01/19/2017, 3:05 PM   If 7PM-7AM, please contact night-coverage [email or web address] Password [email or web address]

## 2017-01-19 NOTE — Care Management Note (Signed)
Case Management Note  Patient Details  Name: [name] MRN: [id number] Date of Birth: 1965-12-15  Subjective/Objective:                 Admitted with CHF. CM consult received. Pt well known. Non compliant and has exhausted all OP resources including HH/HRI through multiple agencies and [facility] para-medicine program. Pt lives with his mother and has home oxygen pta. He is ind with ADL's.    Action/Plan: Pt discharging home today with self care.   Expected Discharge Date:  01/19/17               Expected Discharge Plan:  Home/Self Care  In-House Referral:  NA  Discharge planning Services  CM Consult  Post Acute Care Choice:  NA Choice offered to:  NA  Status of Service:  Completed, signed off  [name], RN 01/19/2017, 3:36 PM

## 2017-01-19 NOTE — Care Management CC44 (Signed)
Condition Code 44 Documentation Completed  Patient Details  Name: [name] MRN: [id number] Date of Birth: 1966/06/17   Condition Code 44 given:  Yes Patient signature on Condition Code 44 notice:  Yes Documentation of 2 MD's agreement:  Yes Code 44 added to claim:  Yes    [name], RN 01/19/2017, 3:58 PM

## 2017-01-20 DIAGNOSIS — I5021 Acute systolic (congestive) heart failure: Secondary | ICD-10-CM | POA: Diagnosis not present

## 2017-01-20 DIAGNOSIS — I482 Chronic atrial fibrillation: Secondary | ICD-10-CM | POA: Diagnosis not present

## 2017-01-20 DIAGNOSIS — K219 Gastro-esophageal reflux disease without esophagitis: Secondary | ICD-10-CM | POA: Diagnosis not present

## 2017-01-28 DIAGNOSIS — R609 Edema, unspecified: Secondary | ICD-10-CM | POA: Diagnosis not present

## 2017-01-28 DIAGNOSIS — M79605 Pain in left leg: Secondary | ICD-10-CM | POA: Diagnosis not present

## 2017-01-28 DIAGNOSIS — R0602 Shortness of breath: Secondary | ICD-10-CM | POA: Diagnosis not present

## 2017-01-28 DIAGNOSIS — Z91018 Allergy to other foods: Secondary | ICD-10-CM | POA: Diagnosis not present

## 2017-01-28 DIAGNOSIS — Z7901 Long term (current) use of anticoagulants: Secondary | ICD-10-CM | POA: Diagnosis not present

## 2017-01-28 DIAGNOSIS — R0902 Hypoxemia: Secondary | ICD-10-CM | POA: Diagnosis not present

## 2017-01-28 DIAGNOSIS — R7989 Other specified abnormal findings of blood chemistry: Secondary | ICD-10-CM | POA: Diagnosis not present

## 2017-01-28 DIAGNOSIS — Z7951 Long term (current) use of inhaled steroids: Secondary | ICD-10-CM | POA: Diagnosis not present

## 2017-01-28 DIAGNOSIS — Z79899 Other long term (current) drug therapy: Secondary | ICD-10-CM | POA: Diagnosis not present

## 2017-01-28 DIAGNOSIS — Z87891 Personal history of nicotine dependence: Secondary | ICD-10-CM | POA: Diagnosis not present

## 2017-01-28 DIAGNOSIS — I252 Old myocardial infarction: Secondary | ICD-10-CM | POA: Diagnosis not present

## 2017-01-28 DIAGNOSIS — Z9119 Patient's noncompliance with other medical treatment and regimen: Secondary | ICD-10-CM | POA: Diagnosis not present

## 2017-01-28 DIAGNOSIS — Z91030 Bee allergy status: Secondary | ICD-10-CM | POA: Diagnosis not present

## 2017-01-28 DIAGNOSIS — Z888 Allergy status to other drugs, medicaments and biological substances status: Secondary | ICD-10-CM | POA: Diagnosis not present

## 2017-01-28 DIAGNOSIS — H5440 Blindness, one eye, unspecified eye: Secondary | ICD-10-CM | POA: Diagnosis not present

## 2017-01-28 DIAGNOSIS — I509 Heart failure, unspecified: Secondary | ICD-10-CM | POA: Diagnosis not present

## 2017-01-28 DIAGNOSIS — K219 Gastro-esophageal reflux disease without esophagitis: Secondary | ICD-10-CM | POA: Diagnosis not present

## 2017-01-28 DIAGNOSIS — E7800 Pure hypercholesterolemia, unspecified: Secondary | ICD-10-CM | POA: Diagnosis not present

## 2017-01-28 DIAGNOSIS — I4891 Unspecified atrial fibrillation: Secondary | ICD-10-CM | POA: Diagnosis not present

## 2017-01-28 DIAGNOSIS — I5033 Acute on chronic diastolic (congestive) heart failure: Secondary | ICD-10-CM | POA: Diagnosis not present

## 2017-01-28 DIAGNOSIS — I110 Hypertensive heart disease with heart failure: Secondary | ICD-10-CM | POA: Diagnosis not present

## 2017-01-28 DIAGNOSIS — Z886 Allergy status to analgesic agent status: Secondary | ICD-10-CM | POA: Diagnosis not present

## 2017-01-28 DIAGNOSIS — J449 Chronic obstructive pulmonary disease, unspecified: Secondary | ICD-10-CM | POA: Diagnosis not present

## 2017-02-01 IMAGING — DX DG CHEST 2V
2 series · 2 of 2 positions shown · non-contrast
Comparison: Chest radiograph performed 05/15/2015

CLINICAL DATA: Acute onset of worsening shortness of breath.
Initial encounter.

EXAM:
CHEST  2 VIEW

[chest pa]
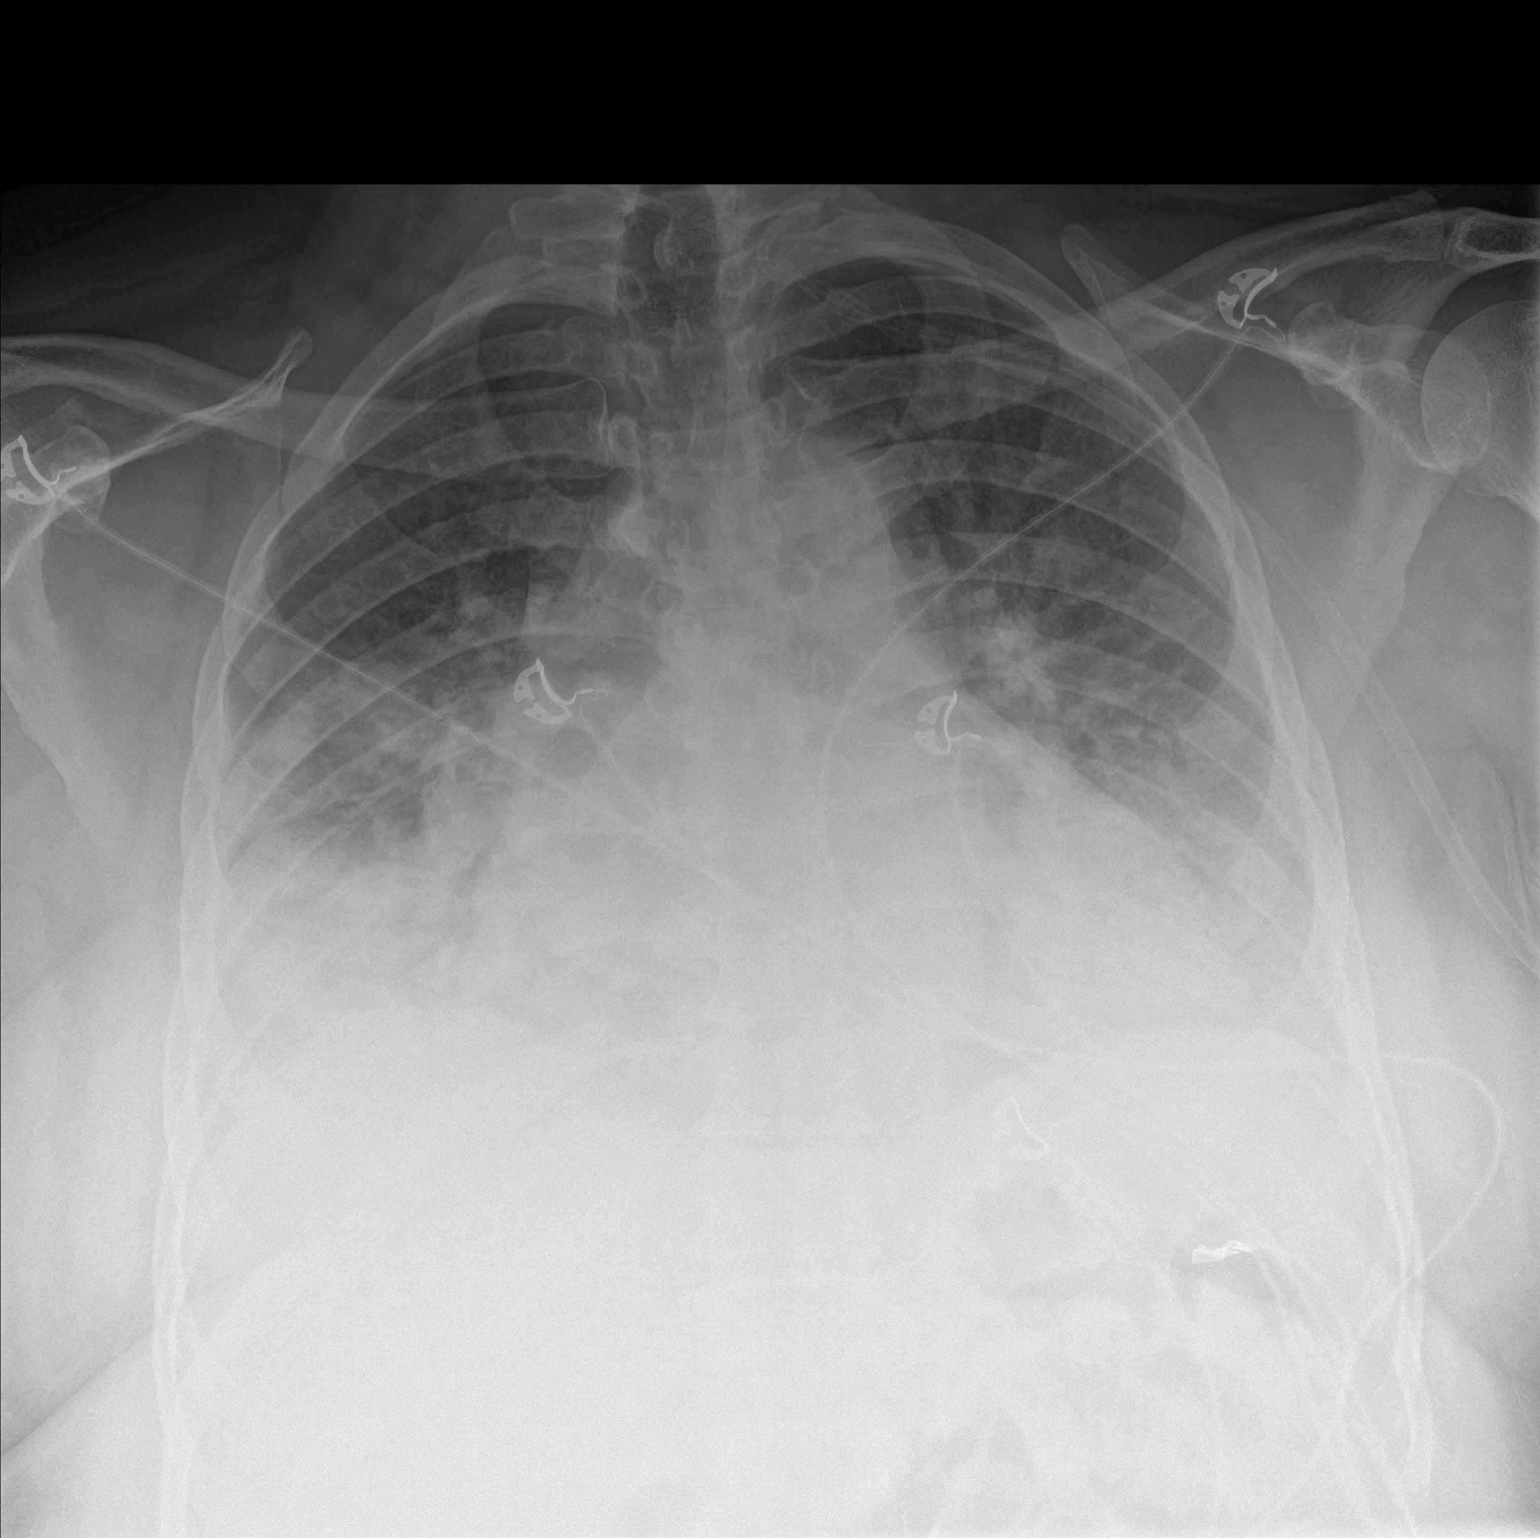

[chest lat]
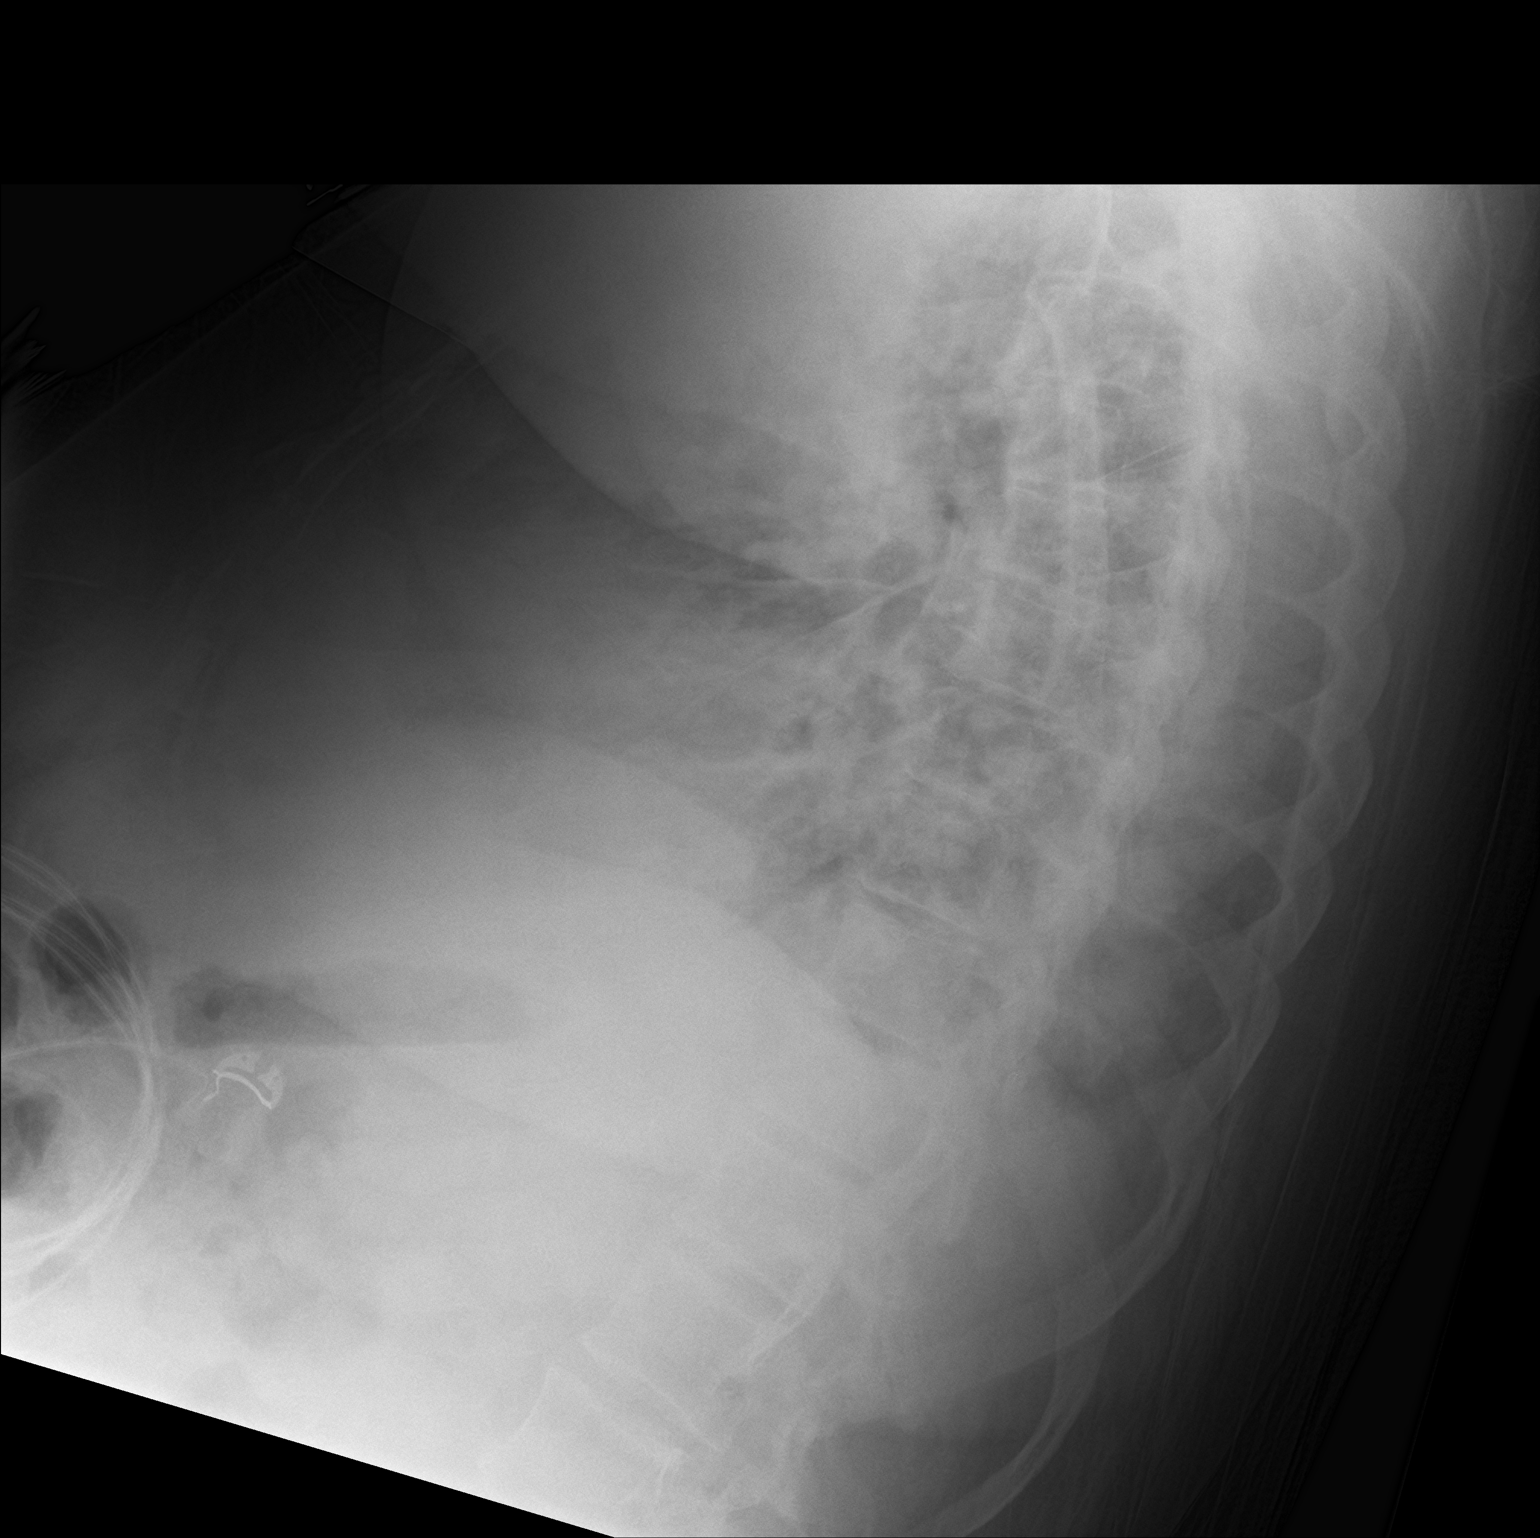

[2 of 2 positions shown; findings below may reference images not displayed]

FINDINGS: The lungs are well-aerated. Bibasilar and bilateral central airspace
opacities raise concern for pulmonary edema, though multifocal
pneumonia could have a similar appearance. No definite significant
pleural effusion or pneumothorax is seen, though trace fluid is
noted along the fissures.

The heart is enlarged.  No acute osseous abnormalities are seen.
IMPRESSION: Bibasilar and bilateral central airspace opacities raise concern for
pulmonary edema, though multifocal pneumonia could have a similar
appearance. Cardiomegaly.

## 2017-02-05 ENCOUNTER — Inpatient Hospital Stay (HOSPITAL_COMMUNITY)
Admission: EM | Admit: 2017-02-05 | Discharge: 2017-02-08 | DRG: 291 | Disposition: A | Discharge status: Home / Self Care | Payer: Medicare Other | Attending: Internal Medicine | Admitting: Internal Medicine

## 2017-02-05 ENCOUNTER — Emergency Department (HOSPITAL_COMMUNITY): Payer: Medicare Other

## 2017-02-05 ENCOUNTER — Encounter (HOSPITAL_COMMUNITY): Payer: Self-pay | Admitting: Emergency Medicine

## 2017-02-05 DIAGNOSIS — I482 Chronic atrial fibrillation, unspecified: Secondary | ICD-10-CM | POA: Diagnosis present

## 2017-02-05 DIAGNOSIS — N182 Chronic kidney disease, stage 2 (mild): Secondary | ICD-10-CM | POA: Diagnosis not present

## 2017-02-05 DIAGNOSIS — E662 Morbid (severe) obesity with alveolar hypoventilation: Secondary | ICD-10-CM | POA: Diagnosis not present

## 2017-02-05 DIAGNOSIS — I10 Essential (primary) hypertension: Secondary | ICD-10-CM | POA: Diagnosis present

## 2017-02-05 DIAGNOSIS — E785 Hyperlipidemia, unspecified: Secondary | ICD-10-CM | POA: Diagnosis present

## 2017-02-05 DIAGNOSIS — Z888 Allergy status to other drugs, medicaments and biological substances status: Secondary | ICD-10-CM

## 2017-02-05 DIAGNOSIS — R7989 Other specified abnormal findings of blood chemistry: Secondary | ICD-10-CM | POA: Diagnosis present

## 2017-02-05 DIAGNOSIS — G4733 Obstructive sleep apnea (adult) (pediatric): Secondary | ICD-10-CM | POA: Diagnosis present

## 2017-02-05 DIAGNOSIS — Z7901 Long term (current) use of anticoagulants: Secondary | ICD-10-CM | POA: Diagnosis not present

## 2017-02-05 DIAGNOSIS — Z886 Allergy status to analgesic agent status: Secondary | ICD-10-CM | POA: Diagnosis not present

## 2017-02-05 DIAGNOSIS — I130 Hypertensive heart and chronic kidney disease with heart failure and stage 1 through stage 4 chronic kidney disease, or unspecified chronic kidney disease: Secondary | ICD-10-CM | POA: Diagnosis not present

## 2017-02-05 DIAGNOSIS — I5043 Acute on chronic combined systolic (congestive) and diastolic (congestive) heart failure: Secondary | ICD-10-CM | POA: Diagnosis not present

## 2017-02-05 DIAGNOSIS — R748 Abnormal levels of other serum enzymes: Secondary | ICD-10-CM | POA: Diagnosis not present

## 2017-02-05 DIAGNOSIS — E66813 Obesity, class 3: Secondary | ICD-10-CM | POA: Diagnosis present

## 2017-02-05 DIAGNOSIS — I509 Heart failure, unspecified: Secondary | ICD-10-CM | POA: Diagnosis not present

## 2017-02-05 DIAGNOSIS — R0602 Shortness of breath: Secondary | ICD-10-CM | POA: Diagnosis not present

## 2017-02-05 DIAGNOSIS — R0600 Dyspnea, unspecified: Secondary | ICD-10-CM

## 2017-02-05 DIAGNOSIS — I5023 Acute on chronic systolic (congestive) heart failure: Secondary | ICD-10-CM | POA: Diagnosis not present

## 2017-02-05 DIAGNOSIS — R778 Other specified abnormalities of plasma proteins: Secondary | ICD-10-CM | POA: Diagnosis present

## 2017-02-05 DIAGNOSIS — Z9111 Patient's noncompliance with dietary regimen: Secondary | ICD-10-CM | POA: Diagnosis not present

## 2017-02-05 DIAGNOSIS — M25571 Pain in right ankle and joints of right foot: Secondary | ICD-10-CM | POA: Diagnosis not present

## 2017-02-05 DIAGNOSIS — Z87891 Personal history of nicotine dependence: Secondary | ICD-10-CM | POA: Diagnosis not present

## 2017-02-05 DIAGNOSIS — Z91030 Bee allergy status: Secondary | ICD-10-CM

## 2017-02-05 DIAGNOSIS — Z9114 Patient's other noncompliance with medication regimen: Secondary | ICD-10-CM

## 2017-02-05 DIAGNOSIS — J45909 Unspecified asthma, uncomplicated: Secondary | ICD-10-CM | POA: Diagnosis not present

## 2017-02-05 DIAGNOSIS — I252 Old myocardial infarction: Secondary | ICD-10-CM

## 2017-02-05 DIAGNOSIS — K219 Gastro-esophageal reflux disease without esophagitis: Secondary | ICD-10-CM | POA: Diagnosis not present

## 2017-02-05 DIAGNOSIS — I110 Hypertensive heart disease with heart failure: Secondary | ICD-10-CM | POA: Diagnosis not present

## 2017-02-05 DIAGNOSIS — Z79899 Other long term (current) drug therapy: Secondary | ICD-10-CM | POA: Diagnosis not present

## 2017-02-05 DIAGNOSIS — Z91018 Allergy to other foods: Secondary | ICD-10-CM | POA: Diagnosis not present

## 2017-02-05 DIAGNOSIS — I272 Pulmonary hypertension, unspecified: Secondary | ICD-10-CM | POA: Diagnosis not present

## 2017-02-05 DIAGNOSIS — R069 Unspecified abnormalities of breathing: Secondary | ICD-10-CM | POA: Diagnosis not present

## 2017-02-05 LAB — CBC
HCT: 33.8 % — ABNORMAL LOW (ref 39.0–52.0)
Hemoglobin: 10.7 g/dL — ABNORMAL LOW (ref 13.0–17.0)
MCH: 25.7 pg — AB (ref 26.0–34.0)
MCHC: 31.7 g/dL (ref 30.0–36.0)
MCV: 81.1 fL (ref 78.0–100.0)
PLATELETS: 234 10*3/uL (ref 150–400)
RBC: 4.17 MIL/uL — ABNORMAL LOW (ref 4.22–5.81)
RDW: 18 % — AB (ref 11.5–15.5)
WBC: 4.6 10*3/uL (ref 4.0–10.5)

## 2017-02-05 LAB — TROPONIN I
TROPONIN I: 0.03 ng/mL — AB (ref <0.03)
Troponin I: 0.03 ng/mL (ref <0.03)

## 2017-02-05 LAB — BASIC METABOLIC PANEL
Anion gap: 8 (ref 5–15)
BUN: 17 mg/dL (ref 6–20)
CALCIUM: 8.9 mg/dL (ref 8.9–10.3)
CO2: 24 mmol/L (ref 22–32)
CREATININE: 1.14 mg/dL (ref 0.61–1.24)
Chloride: 108 mmol/L (ref 101–111)
GFR calc Af Amer: >60 mL/min (ref >60)
GLUCOSE: 94 mg/dL (ref 65–99)
Potassium: 3.6 mmol/L (ref 3.5–5.1)
Sodium: 140 mmol/L (ref 135–145)

## 2017-02-05 LAB — BRAIN NATRIURETIC PEPTIDE: B Natriuretic Peptide: 808 pg/mL — ABNORMAL HIGH (ref 0.0–100.0)

## 2017-02-05 MED ORDER — ASPIRIN 81 MG PO CHEW: 81.0000 mg | CHEWABLE_TABLET | Freq: Every day | ORAL | Status: DC | PRN

## 2017-02-05 MED ORDER — POTASSIUM CHLORIDE CRYS ER 20 MEQ PO TBCR
40.0000 meq | EXTENDED_RELEASE_TABLET | Freq: Every day | ORAL | Status: DC
  Administered 2017-02-06 – 2017-02-08 (×3): 40 meq via ORAL
  Filled 2017-02-05 (×3): qty 2

## 2017-02-05 MED ORDER — SODIUM CHLORIDE 0.9 % IV SOLN: 250.0000 mL | INTRAVENOUS | Status: DC | PRN

## 2017-02-05 MED ORDER — SODIUM CHLORIDE 0.9% FLUSH
3.0000 mL | Freq: Two times a day (BID) | INTRAVENOUS | Status: DC
  Administered 2017-02-05 – 2017-02-08 (×6): 3 mL via INTRAVENOUS

## 2017-02-05 MED ORDER — ONDANSETRON HCL 4 MG/2ML IJ SOLN: 4.0000 mg | Freq: Four times a day (QID) | INTRAMUSCULAR | Status: DC | PRN

## 2017-02-05 MED ORDER — ALBUTEROL SULFATE (2.5 MG/3ML) 0.083% IN NEBU
5.0000 mg | INHALATION_SOLUTION | Freq: Once | RESPIRATORY_TRACT | Status: AC
  Administered 2017-02-05: 5 mg via RESPIRATORY_TRACT
  Filled 2017-02-05: qty 6

## 2017-02-05 MED ORDER — ATORVASTATIN CALCIUM 40 MG PO TABS
40.0000 mg | ORAL_TABLET | Freq: Every day | ORAL | Status: DC
  Administered 2017-02-05 – 2017-02-07 (×3): 40 mg via ORAL
  Filled 2017-02-05 (×3): qty 1

## 2017-02-05 MED ORDER — ACETAMINOPHEN 325 MG PO TABS
650.0000 mg | ORAL_TABLET | ORAL | Status: DC | PRN
Start: 2017-02-05 — End: 2017-02-08

## 2017-02-05 MED ORDER — FUROSEMIDE 10 MG/ML IJ SOLN
60.0000 mg | Freq: Two times a day (BID) | INTRAMUSCULAR | Status: DC
  Administered 2017-02-05 – 2017-02-08 (×6): 60 mg via INTRAVENOUS
  Filled 2017-02-05 (×6): qty 6

## 2017-02-05 MED ORDER — FUROSEMIDE 10 MG/ML IJ SOLN
120.0000 mg | Freq: Once | INTRAVENOUS | Status: AC
  Administered 2017-02-05: 120 mg via INTRAVENOUS
  Filled 2017-02-05: qty 12

## 2017-02-05 MED ORDER — RIVAROXABAN 20 MG PO TABS
20.0000 mg | ORAL_TABLET | Freq: Every day | ORAL | Status: DC
  Administered 2017-02-05 – 2017-02-07 (×3): 20 mg via ORAL
  Filled 2017-02-05 (×3): qty 1

## 2017-02-05 MED ORDER — POTASSIUM CHLORIDE CRYS ER 20 MEQ PO TBCR
20.0000 meq | EXTENDED_RELEASE_TABLET | Freq: Once | ORAL | Status: AC
Start: 2017-02-05 — End: 2017-02-05
  Administered 2017-02-05: 20 meq via ORAL
  Filled 2017-02-05: qty 1

## 2017-02-05 MED ORDER — ORAL CARE MOUTH RINSE
15.0000 mL | Freq: Two times a day (BID) | OROMUCOSAL | Status: DC
  Administered 2017-02-06 – 2017-02-07 (×3): 15 mL via OROMUCOSAL

## 2017-02-05 MED ORDER — SODIUM CHLORIDE 0.9% FLUSH: 3.0000 mL | INTRAVENOUS | Status: DC | PRN

## 2017-02-05 MED ORDER — SPIRONOLACTONE 25 MG PO TABS
50.0000 mg | ORAL_TABLET | Freq: Every day | ORAL | Status: DC
  Administered 2017-02-06 – 2017-02-08 (×3): 50 mg via ORAL
  Filled 2017-02-05 (×3): qty 2

## 2017-02-05 MED ORDER — TAMSULOSIN HCL 0.4 MG PO CAPS
0.4000 mg | ORAL_CAPSULE | Freq: Every day | ORAL | Status: DC
  Administered 2017-02-05 – 2017-02-08 (×4): 0.4 mg via ORAL
  Filled 2017-02-05 (×4): qty 1

## 2017-02-05 MED ORDER — CARVEDILOL 12.5 MG PO TABS
25.0000 mg | ORAL_TABLET | Freq: Two times a day (BID) | ORAL | Status: DC
  Administered 2017-02-05 – 2017-02-07 (×4): 25 mg via ORAL
  Filled 2017-02-05 (×4): qty 2

## 2017-02-05 MED ORDER — PANTOPRAZOLE SODIUM 40 MG PO TBEC
40.0000 mg | DELAYED_RELEASE_TABLET | Freq: Every evening | ORAL | Status: DC
  Administered 2017-02-06 – 2017-02-07 (×2): 40 mg via ORAL
  Filled 2017-02-05 (×2): qty 1

## 2017-02-05 MED ORDER — FERROUS GLUCONATE 324 (38 FE) MG PO TABS
324.0000 mg | ORAL_TABLET | Freq: Two times a day (BID) | ORAL | Status: DC
  Administered 2017-02-06 – 2017-02-08 (×4): 324 mg via ORAL
  Filled 2017-02-05 (×9): qty 1

## 2017-02-05 NOTE — ED Provider Notes (Signed)
[facility]-EM[facility]Y DEPT Provider Note   CSN: [id number] Arrival date & time: 02/05/17  1216     History   Chief Complaint Chief Complaint  Patient presents with  . Shortness of Breath    HPI [name] is a 51 y.o. male.  HPI   51 year old male history of A. fib, asthma, CHF presents today with increasing dyspnea since discharge from hospital on July 31. He reports he has been taking his medications. He has not had follow-up with either his primary care doctor or his cardiologist. He reports increased swelling of bilateral lower extremities right more than left and increasing dyspnea has become much worse over the past 2 days. He states he is barely able to move around. He has been getting to the bathroom and has been able to transfer coming in via EMS. Reports only treatment he received prior to my evaluation by EMS with oxygen. He is on oxygen 3-1/2 L all the time at home. He reports taking his albuterol nebulizers sometimes helps. He reports taking his Coreg, Bumex, and Protonix. He states that he takes a "bunch of other medicines" but is unable to name them all for me. He states he has been taking the medicines as he was prescribed on discharge from the hospital. Review of records reveal that he has a 35% ejection fraction with hypokinesia and is on several toe for anti-coagulation. His weight was 143 kg on discharge from hospital July 31.  His arrival weight on last admission July 30 was 151 kg they estimated his dry weight at 135.  Past Medical History:  Diagnosis Date  . Allergic rhinitis   . Asthma   . Atrial fibrillation (HCC)   . CHF (congestive heart failure) (HCC)   . Essential hypertension   . Gastroesophageal reflux disease   . Heart attack (HCC)   . History of cardiac catheterization    Normal coronaries December 2016  . Noncompliance    Major problem leading to declining health and recurrent hospitalization  . Nonischemic cardiomyopathy (HCC)    LVEF 20-25%  . OSA  (obstructive sleep apnea)   . Osteoarthritis   . Peptic ulcer disease     Patient Active Problem List   Diagnosis Date Noted  . Acute systolic heart failure (HCC) 01/18/2017  . Pedal edema 12/02/2016  . Acute CHF (congestive heart failure) (HCC) 11/09/2016  . Acute on chronic systolic CHF (congestive heart failure) (HCC) 08/19/2016  . CKD (chronic kidney disease), stage II 05/17/2016  . Coronary arteries, normal Dec 2016 01/23/2016  . Chronic anticoagulation-Xarelto 01/23/2016  . NSVT (nonsustained ventricular tachycardia) (HCC) 01/23/2016  . Elevated troponin 12/21/2015  . Nonischemic cardiomyopathy (HCC)   . Morbid obesity due to excess calories (HCC)   . Noncompliance with diet and medication regimen 12/02/2015  . OSA (obstructive sleep apnea) 09/20/2015  . Pulmonary hypertension (HCC) 09/19/2015  . Normocytic anemia 09/19/2015  . Chronic atrial fibrillation (HCC)   . Dyspnea on exertion 05/28/2015  . Acute respiratory failure with hypoxia (HCC) 04/01/2015  . Hypokalemia 04/01/2015  . Obesity hypoventilation syndrome (HCC) 08/08/2014  . GERD (gastroesophageal reflux disease) 08/08/2014  . Edema of right lower extremity 06/21/2014  . Fecal urgency 06/21/2014  . Asthma 02/11/2009  . Hyperlipidemia 07/12/2008  . OBESITY, MORBID 07/12/2008  . Anxiety state 07/12/2008  . DEPRESSION 07/12/2008  . Essential hypertension 07/12/2008  . ALLERGIC RHINITIS 07/12/2008  . Peptic ulcer 07/12/2008  . OSTEOARTHRITIS 07/12/2008    Past Surgical History:  Procedure Laterality Date  .  CARDIAC CATHETERIZATION N/A 06/14/2015   Procedure: Right/Left Heart Cath and Coronary Angiography;  Surgeon: Runell Gess, MD;  Location: [facility] INVASIVE CV LAB;  Service: Cardiovascular;  Laterality: N/A;       Home Medications    Prior to Admission medications   Medication Sig Start Date End Date Taking? Authorizing Provider  albuterol (PROVENTIL) (2.5 MG/3ML) 0.083% nebulizer solution Take 2.5  mg by nebulization every 6 (six) hours as needed for wheezing or shortness of breath.    [provider]  aspirin 81 MG chewable tablet Chew 81 mg by mouth as needed for mild pain or moderate pain.     [provider]  atorvastatin (LIPITOR) 40 MG tablet Take 1 tablet (40 mg total) by mouth daily at 6 PM. 06/25/16   Kathlen Mody, MD  beclomethasone (QVAR) 80 MCG/ACT inhaler Inhale 2 puffs into the lungs 2 (two) times daily.     [provider]  bumetanide (BUMEX) 2 MG tablet Take 1 tablet (2 mg total) by mouth 2 (two) times daily. 11/13/16   Catarina Hartshorn, MD  carvedilol (COREG) 25 MG tablet Take 1 tablet (25 mg total) by mouth 2 (two) times daily with a meal. 11/13/16   Tat, Onalee Hua, MD  docusate sodium (COLACE) 100 MG capsule Take 1 capsule (100 mg total) by mouth 2 (two) times daily. Patient taking differently: Take 100 mg by mouth 2 (two) times daily as needed for mild constipation.  06/25/16   Kathlen Mody, MD  ferrous gluconate (FERGON) 324 MG tablet Take 1 tablet (324 mg total) by mouth 2 (two) times daily with a meal. Patient taking differently: Take 324 mg by mouth daily with breakfast.  06/25/16   Kathlen Mody, MD  furosemide (LASIX) 80 MG tablet Take 1 tablet (80 mg total) by mouth 2 (two) times daily. Patient taking differently: Take 80 mg by mouth 2 (two) times daily as needed for fluid.  12/06/16   Philip Aspen, Limmie Patricia, MD  pantoprazole (PROTONIX) 40 MG tablet Take 1 tablet (40 mg total) by mouth daily at 6 (six) AM. Patient taking differently: Take 40 mg by mouth every evening.  06/26/16   Kathlen Mody, MD  potassium chloride SA (K-DUR,KLOR-CON) 20 MEQ tablet Take 2 tablets (40 mEq total) by mouth daily. Patient taking differently: Take 10-40 mEq by mouth daily.  10/27/16   Filbert Schilder, MD  rivaroxaban (XARELTO) 20 MG TABS tablet Take 1 tablet (20 mg total) by mouth daily with breakfast. Patient taking differently: Take 20 mg by mouth daily with supper.   06/26/16   Kathlen Mody, MD  spironolactone (ALDACTONE) 50 MG tablet Take 1 tablet (50 mg total) by mouth daily. 10/02/16   Houston Siren, MD  tamsulosin (FLOMAX) 0.4 MG CAPS capsule Take 1 capsule (0.4 mg total) by mouth daily. 06/25/16   Kathlen Mody, MD    Family History Family History  Problem Relation Age of Onset  . Stroke Father   . Heart attack Father   . Aneurysm Mother        Cerebral aneurysm  . Hypertension Sister   . Colon cancer Neg Hx   . Inflammatory bowel disease Neg Hx   . Liver disease Neg Hx     Social History Social History  Substance Use Topics  . Smoking status: Former Smoker    Packs/day: 0.50    Years: 20.00    Types: Cigarettes    Start date: 04/26/1988    Quit date: 06/23/2007  . Smokeless  tobacco: Never Used     Comment: 1 ppd former smoker  . Alcohol use No     Comment: No etoh since 2009     Allergies   Banana; Bee venom; Food; Lipitor [atorvastatin]; Aspirin; Metolazone; Oatmeal; Orange juice [orange oil]; Torsemide; Diltiazem; and Hydralazine   Review of Systems Review of Systems   Physical Exam Updated Vital Signs BP (!) 172/132   Pulse (!) 59   Temp 98.7 F (37.1 C) (Oral)   Resp (!) 21   Ht 1.829 m (6')   Wt (!) 145.2 kg (320 lb)   SpO2 100%   BMI 43.40 kg/m   Physical Exam  Constitutional: He appears well-developed and well-nourished. He appears distressed.  HENT:  Head: Normocephalic and atraumatic.  Right Ear: External ear normal.  Left Ear: External ear normal.  Mouth/Throat: Oropharynx is clear and moist.  Neck: Normal range of motion.  Cardiovascular: An irregularly irregular rhythm present. Tachycardia present.   Pulmonary/Chest:  Bilateral lower lobe rhonchi  Abdominal: Soft.  Musculoskeletal: He exhibits edema.  Bilateral lower extremity edema r>L with chronic skin changes of thickening  Skin: Skin is dry. Capillary refill takes less than 2 seconds.  Psychiatric: He has a normal mood and affect.  Nursing note and  vitals reviewed.    ED Treatments / Results  Labs (all labs ordered are listed, but only abnormal results are displayed) Labs Reviewed  CBC  BASIC METABOLIC PANEL  BRAIN NATRIURETIC PEPTIDE  TROPONIN I    EKG  EKG Interpretation  Date/Time:  Friday February 05 2017 12:27:47 EDT Ventricular Rate:  100 PR Interval:    QRS Duration: 85 QT Interval:  371 QTC Calculation: 479 R Axis:   74 Text Interpretation:  Atrial fibrillation Nonspecific T abnormalities, lateral leads Borderline prolonged QT interval No significant change since last tracing Confirmed by Margarita Grizzle [phone]) on 02/05/2017 1:39:36 PM       Radiology Dg Chest Portable 1 View  Result Date: 02/05/2017 CLINICAL DATA:  Shortness of breath.  Lower extremity edema. EXAM: PORTABLE CHEST 1 VIEW COMPARISON:  01/28/2017 . FINDINGS: Cardiomegaly with diffuse bilateral pulmonary interstitial prominence and bilateral pleural effusions consistent with congestive heart failure. IMPRESSION: Congestive heart failure with bilateral pulmonary interstitial edema and bilateral pleural effusions. Electronically Signed   By: Maisie Fus  Register   On: 02/05/2017 13:00    Procedures Procedures (including critical care time)  Medications Ordered in ED Medications  albuterol (PROVENTIL) (2.5 MG/3ML) 0.083% nebulizer solution 5 mg (not administered)  potassium chloride SA (K-DUR,KLOR-CON) CR tablet 20 mEq (not administered)  furosemide (LASIX) 120 mg in dextrose 5 % 50 mL IVPB (not administered)     Initial Impression / Assessment and Plan / ED Course  I have reviewed the triage vital signs and the nursing notes.  Pertinent labs & imaging results that were available during my care of the patient were reviewed by me and considered in my medical decision making (see chart for details).     CHF with asymmetrical leg edema- Patient has had ongoing leg asymmetry with swelling. He has been dopplered in the past and has not had a DVT in his  right leg. Today his symptoms appear mostly due to increased fluid. He states that he has been taking his medication as prescribed, however his weight is up to 145 and he has bilateral pleural effusions. He is oxygenating well laying on the bed but becomes Cardec and dyspneic with any movement including sitting up. Plan observation for improved  diuresis overnight. He may benefit from some type of compression on his lower extremities I discussed with him symptom management versus cure and he may be a candidate for palliative care. Discussed with Dr. Arbutus Leas- he will assume care Final Clinical Impressions(s) / ED Diagnoses   Final diagnoses:  Acute on chronic congestive heart failure, unspecified heart failure type (HCC)  Dyspnea, unspecified type    New Prescriptions New Prescriptions   No medications on file     Margarita Grizzle, MD 02/05/17 1552

## 2017-02-05 NOTE — H&P (Signed)
History and Physical  [name] UYZ:[id number] DOB: 24-Feb-1966 DOA: 02/05/2017   PCP: Pearson Grippe, MD   Patient coming from: Home  Chief Complaint: sob, leg edema  HPI:  [name] is a 51 y.o. male with medical history of history significant of sCHF (numerous admissions for uncompensated CHF but patient continues to be noncompliant with diet and medications outpatient and has been fired from numerous cardiology clinics secondary to no showing for appointments), atrial fibrillation (on xarelto), HTN, MI, asthma, OSA (no on CPAP or BiPap) presents with progressive leg swelling since discharge from the hospital 2 weeks ago.  He states it has been worse over the past 2 days. Patient reports he has been taking his "water pill" as prescribed but that he feels like it just keeps the water in his legs. He felt progressively short of breath and has not been able to lay flat in bed.  Patient denies fevers, chills, headache, chest pain, dyspnea, nausea, vomiting, diarrhea, abdominal pain, dysuria,  hematochezia, and melena.  He was most recently discharged from the hospital after a two-day stay on 01/19/2017. His discharge weight was 315 pounds. However, he also had an admission from 12/02/2016 through 12/06/2016 at which time his discharge weight was 292 pounds. Nevertheless, the patient continues to emphatically state that he is compliant with his medications although he has had a well-documented history of poor compliance and dietary indiscretion.  In the emergency department, the patient was afebrile and hemodynamically stable and saturating 100% on 3 L. The patient was given furosemide 120 mg IV. BMP and CBC were unremarkable with his creatinine and hemoglobin at baseline. Chest x-ray showed bilateral pulmonary edema on bilateral pleural effusions. Notably, the patient had a heart catheterization on 06/14/2015 which revealed normal coronary arteries.   Assessment/Plan: Acute on chronic  systolic CHF - previous dry weight around 294-297 -His dry weight approx 292 - continuelasix to 60mg  IV BID - continue aldactone - repeat Echo--EF 35-40 percent, diffuse HK, PSP 56, mild MR, +PFO - fluid restriction - monitor daily BMP  -continue aldactone  Chronic atrial fibrillation - Continue xarelto - continuecoreg  HTN - restartedcoreg  GERD - continue protoninx  Elevated Troponin -secondary to CHF - cycle troponins  Hyperlipidemia -continue statin  Right>Left leg edema -11/12/16--Right venous duplex--negative for DVT  CKD stage 2 -baseline creatinine 1.0-1.3 -monitor with diuresis     Past Medical History:  Diagnosis Date  . Allergic rhinitis   . Asthma   . Atrial fibrillation (HCC)   . CHF (congestive heart failure) (HCC)   . Essential hypertension   . Gastroesophageal reflux disease   . Heart attack (HCC)   . History of cardiac catheterization    Normal coronaries December 2016  . Noncompliance    Major problem leading to declining health and recurrent hospitalization  . Nonischemic cardiomyopathy (HCC)    LVEF 20-25%  . OSA (obstructive sleep apnea)   . Osteoarthritis   . Peptic ulcer disease    Past Surgical History:  Procedure Laterality Date  . CARDIAC CATHETERIZATION N/A 06/14/2015   Procedure: Right/Left Heart Cath and Coronary Angiography;  Surgeon: Runell Gess, MD;  Location: [facility] INVASIVE CV LAB;  Service: Cardiovascular;  Laterality: N/A;   Social History:  reports that he quit smoking about 9 years ago. His smoking use included Cigarettes. He started smoking about 28 years ago. He has a 10.00 pack-year smoking history. He has never used smokeless tobacco. He reports that  he does not drink alcohol or use drugs.   Family History  Problem Relation Age of Onset  . Stroke Father   . Heart attack Father   . Aneurysm Mother        Cerebral aneurysm  . Hypertension Sister   . Colon cancer Neg Hx   . Inflammatory  bowel disease Neg Hx   . Liver disease Neg Hx      Allergies  Allergen Reactions  . Banana Shortness Of Breath  . Bee Venom Shortness Of Breath, Swelling and Other (See Comments)    Reaction:  Facial swelling  . Food Shortness Of Breath, Rash and Other (See Comments)    Pt states that he is allergic to strawberries.    . Lipitor [Atorvastatin] Shortness Of Breath and Other (See Comments)    Reaction:  Nose bleeds   . Aspirin Other (See Comments)    Reaction:  GI upset   . Metolazone Other (See Comments)    Pt states that he stopped taking this med due to heart attack like symptoms.   . Oatmeal Nausea And Vomiting  . Orange Juice [Orange Oil] Nausea And Vomiting    All acidic products make him nauseous and upset stomach  . Torsemide Swelling and Other (See Comments)    Reaction:  Swelling of feet/legs   . Diltiazem Palpitations  . Hydralazine Palpitations     Prior to Admission medications   Medication Sig Start Date End Date Taking? Authorizing Provider  albuterol (PROVENTIL) (2.5 MG/3ML) 0.083% nebulizer solution Take 2.5 mg by nebulization every 6 (six) hours as needed for wheezing or shortness of breath.   Yes [provider]  aspirin 81 MG chewable tablet Chew 81 mg by mouth as needed for mild pain or moderate pain.    Yes [provider]  atorvastatin (LIPITOR) 40 MG tablet Take 1 tablet (40 mg total) by mouth daily at 6 PM. 06/25/16  Yes Kathlen Mody, MD  beclomethasone (QVAR) 80 MCG/ACT inhaler Inhale 2 puffs into the lungs 2 (two) times daily.    Yes [provider]  bumetanide (BUMEX) 2 MG tablet Take 1 tablet (2 mg total) by mouth 2 (two) times daily. 11/13/16  Yes Zaila Crew, Onalee Hua, MD  carvedilol (COREG) 25 MG tablet Take 1 tablet (25 mg total) by mouth 2 (two) times daily with a meal. 11/13/16  Yes Cortez Steelman, Onalee Hua, MD  docusate sodium (COLACE) 100 MG capsule Take 1 capsule (100 mg total) by mouth 2 (two) times daily. Patient taking differently: Take 100  mg by mouth 2 (two) times daily as needed for mild constipation.  06/25/16  Yes Kathlen Mody, MD  ferrous gluconate (FERGON) 324 MG tablet Take 1 tablet (324 mg total) by mouth 2 (two) times daily with a meal. Patient taking differently: Take 324 mg by mouth daily with breakfast.  06/25/16  Yes Kathlen Mody, MD  furosemide (LASIX) 80 MG tablet Take 1 tablet (80 mg total) by mouth 2 (two) times daily. Patient taking differently: Take 80 mg by mouth 2 (two) times daily as needed for fluid.  12/06/16  Yes Philip Aspen, Limmie Patricia, MD  pantoprazole (PROTONIX) 40 MG tablet Take 1 tablet (40 mg total) by mouth daily at 6 (six) AM. Patient taking differently: Take 40 mg by mouth every evening.  06/26/16  Yes Kathlen Mody, MD  potassium chloride SA (K-DUR,KLOR-CON) 20 MEQ tablet Take 2 tablets (40 mEq total) by mouth daily. Patient taking differently: Take 10-40 mEq by mouth daily.  10/27/16  Yes Filbert Schilder, MD  rivaroxaban (XARELTO) 20 MG TABS tablet Take 1 tablet (20 mg total) by mouth daily with breakfast. Patient taking differently: Take 20 mg by mouth daily with supper.  06/26/16  Yes Kathlen Mody, MD  spironolactone (ALDACTONE) 50 MG tablet Take 1 tablet (50 mg total) by mouth daily. 10/02/16  Yes Houston Siren, MD  tamsulosin (FLOMAX) 0.4 MG CAPS capsule Take 1 capsule (0.4 mg total) by mouth daily. 06/25/16  Yes Kathlen Mody, MD    Review of Systems:  Constitutional:  No weight loss, night sweats, Fevers, chills, fatigue.  Head&Eyes: No headache.  No vision loss.  No eye pain or scotoma ENT:  No Difficulty swallowing,Tooth/dental problems,Sore throat,  No ear ache, post nasal drip,  Cardio-vascular:  No chest pain, Orthopnea, PND, swelling in lower extremities,  dizziness, palpitations  GI:  No  abdominal pain, nausea, vomiting, diarrhea, loss of appetite, hematochezia, melena, heartburn, indigestion, Resp:  +SOB No cough. No coughing up of blood .No wheezing.No chest wall deformity    Skin:  no rash or lesions.  GU:  no dysuria, change in color of urine, no urgency or frequency. No flank pain.  Musculoskeletal:  +Leg edema. No decreased range of motion. No back pain.  Psych:  No change in mood or affect. No depression or anxiety. Neurologic: No headache, no dysesthesia, no focal weakness, no vision loss. No syncope  Physical Exam: Vitals:   02/05/17 1300 02/05/17 1330 02/05/17 1400 02/05/17 1430  BP: (!) 152/107 (!) 161/116 (!) 145/107 (!) 150/110  Pulse: (!) 103 (!) 115 (!) 111 73  Resp: (!) 24 (!) 31 (!) 21   Temp:      TempSrc:      SpO2: 100% 100% 100% 100%  Weight:      Height:       General:  A&O x 3, NAD, nontoxic, pleasant/cooperative Head/Eye: No conjunctival hemorrhage, no icterus, [place]/AT, No nystagmus ENT:  No icterus,  No thrush, good dentition, no pharyngeal exudate Neck:  No masses, no lymphadenpathy, no bruits CV:  RRR, no rub, no gallop, no S3 Lung:  CTAB, good air movement, no wheeze, no rhonchi Abdomen: soft/NT, +BS, nondistended, no peritoneal signs Ext: No cyanosis, No rashes, No petechiae, No lymphangitis, No edema Neuro: CNII-XII intact, strength 4/5 in bilateral upper and lower extremities, no dysmetria  Labs on Admission:  Basic Metabolic Panel:  Recent Labs Lab 02/05/17 1240  NA 140  K 3.6  CL 108  CO2 24  GLUCOSE 94  BUN 17  CREATININE 1.14  CALCIUM 8.9   Liver Function Tests: No results for input(s): AST, ALT, ALKPHOS, BILITOT, PROT, ALBUMIN in the last 168 hours. No results for input(s): LIPASE, AMYLASE in the last 168 hours. No results for input(s): AMMONIA in the last 168 hours. CBC:  Recent Labs Lab 02/05/17 1240  WBC 4.6  HGB 10.7*  HCT 33.8*  MCV 81.1  PLT 234   Coagulation Profile: No results for input(s): INR, PROTIME in the last 168 hours. Cardiac Enzymes:  Recent Labs Lab 02/05/17 1240  TROPONINI 0.03*   BNP: Invalid input(s): POCBNP CBG: No results for input(s): GLUCAP in the last 168  hours. Urine analysis:    Component Value Date/Time   COLORURINE AMBER (A) 10/24/2016 0923   APPEARANCEUR CLEAR 10/24/2016 0923   LABSPEC 1.027 10/24/2016 0923   PHURINE 5.0 10/24/2016 0923   GLUCOSEU NEGATIVE 10/24/2016 0923   GLUCOSEU NEG mg/dL 16/03/9603 5409   HGBUR NEGATIVE 10/24/2016  0923   BILIRUBINUR NEGATIVE 10/24/2016 0923   KETONESUR NEGATIVE 10/24/2016 0923   PROTEINUR 100 (A) 10/24/2016 0923   UROBILINOGEN 0.2 08/08/2014 1445   NITRITE NEGATIVE 10/24/2016 0923   LEUKOCYTESUR NEGATIVE 10/24/2016 0923   Sepsis Labs: @LABRCNTIP (procalcitonin:4,lacticidven:4) )No results found for this or any previous visit (from the past 240 hour(s)).   Radiological Exams on Admission: Dg Chest Portable 1 View  Result Date: 02/05/2017 CLINICAL DATA:  Shortness of breath.  Lower extremity edema. EXAM: PORTABLE CHEST 1 VIEW COMPARISON:  01/28/2017 . FINDINGS: Cardiomegaly with diffuse bilateral pulmonary interstitial prominence and bilateral pleural effusions consistent with congestive heart failure. IMPRESSION: Congestive heart failure with bilateral pulmonary interstitial edema and bilateral pleural effusions. Electronically Signed   By: Maisie Fus  Register   On: 02/05/2017 13:00    EKG: Independently reviewed. Afib, nonspecific T wave changes    Time spent:60 minutes Code Status:   FULL Family Communication:  No Family at bedside Disposition Plan: expect 3-4 day hospitalization Consults called: none DVT Prophylaxis: Xarelto  Farhana Fellows, DO  [facility] Pager [phone]  If 7PM-7AM, please contact night-coverage [email or web address] Password [email or web address] 02/05/2017, 3:15 PM

## 2017-02-05 NOTE — ED Triage Notes (Signed)
Per EMS patient from home. Patient complains of shortness of breath. Patient also states edema of lower extremities and weeping of wound on right ankle.

## 2017-02-05 NOTE — ED Notes (Signed)
Date and time results received: 02/05/17 1:42 PM (use smartphrase ".now" to insert current time)  Test: Troponin Critical Value: 0.03  Name of Provider Notified: Ray  Orders Received? Or Actions Taken?: Orders Received - See Orders for details

## 2017-02-06 LAB — BASIC METABOLIC PANEL
Anion gap: 8 (ref 5–15)
BUN: 16 mg/dL (ref 6–20)
CHLORIDE: 106 mmol/L (ref 101–111)
CO2: 29 mmol/L (ref 22–32)
CREATININE: 1.23 mg/dL (ref 0.61–1.24)
Calcium: 8.7 mg/dL — ABNORMAL LOW (ref 8.9–10.3)
GFR calc Af Amer: >60 mL/min (ref >60)
GFR calc non Af Amer: >60 mL/min (ref >60)
Glucose, Bld: 108 mg/dL — ABNORMAL HIGH (ref 65–99)
Potassium: 3.5 mmol/L (ref 3.5–5.1)
Sodium: 143 mmol/L (ref 135–145)

## 2017-02-06 LAB — TROPONIN I: TROPONIN I: 0.03 ng/mL — AB (ref <0.03)

## 2017-02-06 LAB — MAGNESIUM: MAGNESIUM: 1.9 mg/dL (ref 1.7–2.4)

## 2017-02-06 NOTE — Progress Notes (Signed)
PROGRESS NOTE  SERAPHIM TROW WUJ:[id number] DOB: 1965/08/05 DOA: 02/05/2017 PCP: Pearson Grippe, MD  Brief History:  51 y.o. male with medical history of history significant of sCHF (numerous admissions for uncompensated CHF but patient continues to be noncompliant with diet and medications outpatient and has been fired from numerous cardiology clinics secondary to no showing for appointments), atrial fibrillation (on xarelto), HTN, MI, asthma, OSA (no on CPAP or BiPap) presents with progressive leg swelling since discharge from the hospital 2 weeks ago.  He states it has been worse over the past 2 days. Patient reports he has been taking his "water pill" as prescribed but that he feels like it just keeps the water in his legs. He felt progressively short of breath and has not been able to lay flat in bed. He was most recently discharged from the hospital after a two-day stay on 01/19/2017. His discharge weight was 315 pounds. However, he also had an admission from 12/02/2016 through 12/06/2016 at which time his discharge weight was 292 pounds. Nevertheless, the patient continues to emphatically state that he is compliant with his medications although he has had a well-documented history of poor compliance and dietary indiscretion.  Assessment/Plan: Acute on chronic systolic CHF -His dry weight approx 292-294 -NEG 4.5L for the admit - continuelasix to 60mg  IV BID - continue aldactone - repeat Echo--EF 35-40 percent, diffuse HK, PSP 56, mild MR, +PFO - fluid restriction - monitor daily BMP  -continue aldactone - pt states he eats "french fries" regularly  Chronic atrial fibrillation - Continue xarelto - continuecoreg - rate controlled  HTN - restartedcoreg -improving with diuresis  GERD - continue protoninx  Elevated Troponin -secondary to CHF - cycle troponins--0.03 x 3  Hyperlipidemia -continue statin  Right>Left leg edema -11/12/16--Right venous  duplex--negative for DVT  CKD stage 2 -baseline creatinine 1.0-1.3 -monitor with diuresis   Disposition Plan:   Home in 1-2 days  Family Communication:  No Family at bedside  Consultants:  none  Code Status:  FULL  DVT Prophylaxis:  Xarelto   Procedures: As Listed in Progress Note Above  Antibiotics: None    Subjective: Patient states that he is breathing better but still has dyspnea with minimal exertion. He denies any nausea, vomiting, diarrhea, abdominal pain, dysuria, hematuria.  Objective: Vitals:   02/05/17 1948 02/05/17 2012 02/06/17 0500 02/06/17 0927  BP:  (!) 158/109 130/77   Pulse:  90 81   Resp:  20 18   Temp:  97.9 F (36.6 C) 97.7 F (36.5 C)   TempSrc:  Oral Oral   SpO2: 100% 100% 98% 96%  Weight:  (!) 152.6 kg (336 lb 8 oz) (!) 148.8 kg (328 lb)   Height:  6' (1.829 m)      Intake/Output Summary (Last 24 hours) at 02/06/17 1645 Last data filed at 02/06/17 0900  Gross per 24 hour  Intake              610 ml  Output             4075 ml  Net            -3465 ml   Weight change:  Exam:   General:  Pt is alert, follows commands appropriately, not in acute distress  HEENT: No icterus, No thrush, No neck mass, [place]/AT  Cardiovascular: IRRR, S1/S2, no rubs, no gallops +JVD  Respiratory: Diminished breath sounds with bibasilar crackles. No wheezing.  Abdomen: Soft/+BS, non tender, non distended, no guarding  Extremities: 2 +LE edema, No lymphangitis, No petechiae, No rashes, no synovitis   Data Reviewed: I have personally reviewed following labs and imaging studies Basic Metabolic Panel:  Recent Labs Lab 02/05/17 1240 02/06/17 0216  NA 140 143  K 3.6 3.5  CL 108 106  CO2 24 29  GLUCOSE 94 108*  BUN 17 16  CREATININE 1.14 1.23  CALCIUM 8.9 8.7*  MG  --  1.9   Liver Function Tests: No results for input(s): AST, ALT, ALKPHOS, BILITOT, PROT, ALBUMIN in the last 168 hours. No results for input(s): LIPASE, AMYLASE in the last 168  hours. No results for input(s): AMMONIA in the last 168 hours. Coagulation Profile: No results for input(s): INR, PROTIME in the last 168 hours. CBC:  Recent Labs Lab 02/05/17 1240  WBC 4.6  HGB 10.7*  HCT 33.8*  MCV 81.1  PLT 234   Cardiac Enzymes:  Recent Labs Lab 02/05/17 1240 02/05/17 2000 02/06/17 0216  TROPONINI 0.03* 0.03* 0.03*   BNP: Invalid input(s): POCBNP CBG: No results for input(s): GLUCAP in the last 168 hours. HbA1C: No results for input(s): HGBA1C in the last 72 hours. Urine analysis:    Component Value Date/Time   COLORURINE AMBER (A) 10/24/2016 0923   APPEARANCEUR CLEAR 10/24/2016 0923   LABSPEC 1.027 10/24/2016 0923   PHURINE 5.0 10/24/2016 0923   GLUCOSEU NEGATIVE 10/24/2016 0923   GLUCOSEU NEG mg/dL 81/27/5170 0174   HGBUR NEGATIVE 10/24/2016 0923   BILIRUBINUR NEGATIVE 10/24/2016 0923   KETONESUR NEGATIVE 10/24/2016 0923   PROTEINUR 100 (A) 10/24/2016 0923   UROBILINOGEN 0.2 08/08/2014 1445   NITRITE NEGATIVE 10/24/2016 0923   LEUKOCYTESUR NEGATIVE 10/24/2016 0923   Sepsis Labs: @LABRCNTIP (procalcitonin:4,lacticidven:4) )No results found for this or any previous visit (from the past 240 hour(s)).   Scheduled Meds: . atorvastatin  40 mg Oral q1800  . carvedilol  25 mg Oral BID WC  . ferrous gluconate  324 mg Oral BID WC  . furosemide  60 mg Intravenous BID  . mouth rinse  15 mL Mouth Rinse BID  . pantoprazole  40 mg Oral QPM  . potassium chloride SA  40 mEq Oral Daily  . rivaroxaban  20 mg Oral Q supper  . sodium chloride flush  3 mL Intravenous Q12H  . spironolactone  50 mg Oral Daily  . tamsulosin  0.4 mg Oral Daily   Continuous Infusions: . sodium chloride      Procedures/Studies: Dg Chest 2 View  Result Date: 01/18/2017 CLINICAL DATA:  Increasing shortness of breath. EXAM: CHEST  2 VIEW COMPARISON:  01/09/2017 and 01/07/2017 and 09/05/2016 FINDINGS: Chronic cardiomegaly. Increased pulmonary vascularity since the prior  study. No discrete pulmonary edema or effusions. Bones appear normal. IMPRESSION: Increasing pulmonary vascular congestion.  Chronic cardiomegaly. Electronically Signed   By: Francene Boyers M.D.   On: 01/18/2017 09:04   Dg Chest Portable 1 View  Result Date: 02/05/2017 CLINICAL DATA:  Shortness of breath.  Lower extremity edema. EXAM: PORTABLE CHEST 1 VIEW COMPARISON:  01/28/2017 . FINDINGS: Cardiomegaly with diffuse bilateral pulmonary interstitial prominence and bilateral pleural effusions consistent with congestive heart failure. IMPRESSION: Congestive heart failure with bilateral pulmonary interstitial edema and bilateral pleural effusions. Electronically Signed   By: Maisie Fus  Register   On: 02/05/2017 13:00    Jamarkus Lisbon, DO  [facility] Pager [phone]  If 7PM-7AM, please contact night-coverage [email or web address] Password [email or web address] 02/06/2017, 4:45 PM   LOS: 1 day

## 2017-02-07 LAB — BASIC METABOLIC PANEL
ANION GAP: 7 (ref 5–15)
BUN: 19 mg/dL (ref 6–20)
CALCIUM: 8.6 mg/dL — AB (ref 8.9–10.3)
CO2: 29 mmol/L (ref 22–32)
Chloride: 104 mmol/L (ref 101–111)
Creatinine, Ser: 1.2 mg/dL (ref 0.61–1.24)
GFR calc Af Amer: >60 mL/min (ref >60)
GLUCOSE: 89 mg/dL (ref 65–99)
POTASSIUM: 3.5 mmol/L (ref 3.5–5.1)
Sodium: 140 mmol/L (ref 135–145)

## 2017-02-07 LAB — CBC
HEMATOCRIT: 34.3 % — AB (ref 39.0–52.0)
HEMOGLOBIN: 10.6 g/dL — AB (ref 13.0–17.0)
MCH: 25.2 pg — AB (ref 26.0–34.0)
MCHC: 30.9 g/dL (ref 30.0–36.0)
MCV: 81.7 fL (ref 78.0–100.0)
Platelets: 219 10*3/uL (ref 150–400)
RBC: 4.2 MIL/uL — AB (ref 4.22–5.81)
RDW: 17.8 % — ABNORMAL HIGH (ref 11.5–15.5)
WBC: 3.4 10*3/uL — ABNORMAL LOW (ref 4.0–10.5)

## 2017-02-07 LAB — MAGNESIUM: MAGNESIUM: 1.9 mg/dL (ref 1.7–2.4)

## 2017-02-07 MED ORDER — CARVEDILOL 12.5 MG PO TABS
12.5000 mg | ORAL_TABLET | Freq: Two times a day (BID) | ORAL | Status: DC
  Administered 2017-02-07 – 2017-02-08 (×2): 12.5 mg via ORAL
  Filled 2017-02-07 (×2): qty 1

## 2017-02-07 NOTE — Progress Notes (Signed)
PROGRESS NOTE  [name] ZOX:[id number] DOB: 04/17/1966 DOA: 02/05/2017 PCP: Pearson Grippe, MD  Brief History:  51 y.o.malewith medical history of history significant of sCHF (numerous admissions for uncompensated CHF but patient continues to be noncompliant with diet and medications outpatient and has been fired from numerous cardiology clinics secondary to no showing for appointments), atrial fibrillation (on xarelto), HTN, MI, asthma, OSA (no on CPAP or BiPap) presents with progressive leg swelling since discharge from the hospital 2 weeks ago. He states it has been worse over the past 2 days.Patient reports he has been taking his "water pill" as prescribed but that he feels like it just keeps the water in his legs. He felt progressively short of breath and has not been able to lay flat in bed. He was most recently discharged from the hospital after a two-day stay on 01/19/2017. His discharge weight was 315 pounds. However, he also had an admission from 12/02/2016 through 12/06/2016 at which time his discharge weight was 292 pounds. Nevertheless, the patient continues to emphaticallystate that he is compliant with his medications although he has had a well-documented history of poor compliance and dietary indiscretion.  Assessment/Plan: Acute on chronic systolic CHF -His dry weight approx 292-294 -NEG 7.7L for the admit -NEG 9 lbs for the admit - continuelasix to 60mg  IV BID - continue aldactone - repeat Echo--EF 35-40 percent, diffuse HK, PSP 56, mild MR, +PFO - fluid restriction - monitor daily BMP  - pt states he eats "french fries" regularly  Chronic atrial fibrillation - Continue xarelto - continuecoreg - rate controlled  HTN - decrease coreg due to soft BP -improving with diuresis  GERD - continue protoninx  Elevated Troponin -secondary to CHF - cycle troponins--0.03 x 3  Hyperlipidemia -continue statin  Right>Left leg  edema -11/12/16--Right venous duplex--negative for DVT  CKD stage 2 -baseline creatinine 1.0-1.3 -monitor with diuresis   Disposition Plan:   Home 8/20 or 8/21 Family Communication:  No Family at bedside  Consultants:  none  Code Status:  FULL  DVT Prophylaxis:  Xarelto   Procedures: As Listed in Progress Note Above  Antibiotics: None    Subjective: Patient states that he is breathing better now. He denies any fevers, chills, chest pain, headache, neck pain, nausea, vomiting, diarrhea, abdominal pain. No dysuria or hematuria. No rashes.  Objective: Vitals:   49/18/18 1735 02/06/17 2134 02/07/17 0503 02/07/17 1441  BP: 133/76 131/90 (!) 142/101 105/70  Pulse: 76 86 91 81  Resp: 18 18 18 18   Temp: 98.4 F (36.9 C) (!) 97.5 F (36.4 C) 98.4 F (36.9 C) 98.4 F (36.9 C)  TempSrc: Oral Oral Oral Oral  SpO2: 97% 100% 95% 100%  Weight:   (!) 145.1 kg (319 lb 12.8 oz)   Height:        Intake/Output Summary (Last 24 hours) at 02/07/17 1713 Last data filed at 02/07/17 1300  Gross per 24 hour  Intake              843 ml  Output             5850 ml  Net            -5007 ml   Weight change: -0.091 kg (-3.2 oz) Exam:   General:  Pt is alert, follows commands appropriately, not in acute distress  HEENT: No icterus, No thrush, No neck mass, [place]/AT  Cardiovascular: RRR, S1/S2, no rubs, no  gallops +JVD  Respiratory: Bibasilar crackles. No wheezing. Good air movement.  Abdomen: Soft/+BS, non tender, non distended, no guarding  Extremities: 1+LE edema, No lymphangitis, No petechiae, No rashes, no synovitis   Data Reviewed: I have personally reviewed following labs and imaging studies Basic Metabolic Panel:  Recent Labs Lab 02/05/17 1240 02/06/17 0216 02/07/17 0648  NA 140 143 140  K 3.6 3.5 3.5  CL 108 106 104  CO2 24 29 29   GLUCOSE 94 108* 89  BUN 17 16 19   CREATININE 1.14 1.23 1.20  CALCIUM 8.9 8.7* 8.6*  MG  --  1.9 1.9   Liver Function  Tests: No results for input(s): AST, ALT, ALKPHOS, BILITOT, PROT, ALBUMIN in the last 168 hours. No results for input(s): LIPASE, AMYLASE in the last 168 hours. No results for input(s): AMMONIA in the last 168 hours. Coagulation Profile: No results for input(s): INR, PROTIME in the last 168 hours. CBC:  Recent Labs Lab 02/05/17 1240 02/07/17 0648  WBC 4.6 3.4*  HGB 10.7* 10.6*  HCT 33.8* 34.3*  MCV 81.1 81.7  PLT 234 219   Cardiac Enzymes:  Recent Labs Lab 02/05/17 1240 02/05/17 2000 02/06/17 0216  TROPONINI 0.03* 0.03* 0.03*   BNP: Invalid input(s): POCBNP CBG: No results for input(s): GLUCAP in the last 168 hours. HbA1C: No results for input(s): HGBA1C in the last 72 hours. Urine analysis:    Component Value Date/Time   COLORURINE AMBER (A) 10/24/2016 0923   APPEARANCEUR CLEAR 10/24/2016 0923   LABSPEC 1.027 10/24/2016 0923   PHURINE 5.0 10/24/2016 0923   GLUCOSEU NEGATIVE 10/24/2016 0923   GLUCOSEU NEG mg/dL 18/86/7737 3668   HGBUR NEGATIVE 10/24/2016 0923   BILIRUBINUR NEGATIVE 10/24/2016 0923   KETONESUR NEGATIVE 10/24/2016 0923   PROTEINUR 100 (A) 10/24/2016 0923   UROBILINOGEN 0.2 08/08/2014 1445   NITRITE NEGATIVE 10/24/2016 0923   LEUKOCYTESUR NEGATIVE 10/24/2016 0923   Sepsis Labs: @LABRCNTIP (procalcitonin:4,lacticidven:4) )No results found for this or any previous visit (from the past 240 hour(s)).   Scheduled Meds: . atorvastatin  40 mg Oral q1800  . carvedilol  12.5 mg Oral BID WC  . ferrous gluconate  324 mg Oral BID WC  . furosemide  60 mg Intravenous BID  . mouth rinse  15 mL Mouth Rinse BID  . pantoprazole  40 mg Oral QPM  . potassium chloride SA  40 mEq Oral Daily  . rivaroxaban  20 mg Oral Q supper  . sodium chloride flush  3 mL Intravenous Q12H  . spironolactone  50 mg Oral Daily  . tamsulosin  0.4 mg Oral Daily   Continuous Infusions: . sodium chloride      Procedures/Studies: Dg Chest 2 View  Result Date:  01/18/2017 CLINICAL DATA:  Increasing shortness of breath. EXAM: CHEST  2 VIEW COMPARISON:  01/09/2017 and 01/07/2017 and 09/05/2016 FINDINGS: Chronic cardiomegaly. Increased pulmonary vascularity since the prior study. No discrete pulmonary edema or effusions. Bones appear normal. IMPRESSION: Increasing pulmonary vascular congestion.  Chronic cardiomegaly. Electronically Signed   By: Francene Boyers M.D.   On: 01/18/2017 09:04   Dg Chest Portable 1 View  Result Date: 02/05/2017 CLINICAL DATA:  Shortness of breath.  Lower extremity edema. EXAM: PORTABLE CHEST 1 VIEW COMPARISON:  01/28/2017 . FINDINGS: Cardiomegaly with diffuse bilateral pulmonary interstitial prominence and bilateral pleural effusions consistent with congestive heart failure. IMPRESSION: Congestive heart failure with bilateral pulmonary interstitial edema and bilateral pleural effusions. Electronically Signed   By: Maisie Fus  Register   On: 02/05/2017 13:00  Samyia Motter, DO  [facility] Pager [phone]  If 7PM-7AM, please contact night-coverage [email or web address] Password [email or web address] 02/07/2017, 5:13 PM   LOS: 2 days

## 2017-02-08 LAB — BASIC METABOLIC PANEL
ANION GAP: 7 (ref 5–15)
BUN: 24 mg/dL — ABNORMAL HIGH (ref 6–20)
CHLORIDE: 103 mmol/L (ref 101–111)
CO2: 30 mmol/L (ref 22–32)
Calcium: 8.6 mg/dL — ABNORMAL LOW (ref 8.9–10.3)
Creatinine, Ser: 1.33 mg/dL — ABNORMAL HIGH (ref 0.61–1.24)
Glucose, Bld: 107 mg/dL — ABNORMAL HIGH (ref 65–99)
POTASSIUM: 3.4 mmol/L — AB (ref 3.5–5.1)
SODIUM: 140 mmol/L (ref 135–145)

## 2017-02-08 MED ORDER — BUMETANIDE 1 MG PO TABS
2.0000 mg | ORAL_TABLET | Freq: Two times a day (BID) | ORAL | Status: DC
  Administered 2017-02-08: 2 mg via ORAL
  Filled 2017-02-08: qty 2

## 2017-02-08 NOTE — Care Management Important Message (Signed)
Important Message  Patient Details  Name: [name] MRN: [id number] Date of Birth: 05-Aug-1965   Medicare Important Message Given:  Yes    [name], RN 02/08/2017, 2:31 PM

## 2017-02-08 NOTE — Discharge Summary (Addendum)
Physician Discharge Summary  [name] ZJQ:[id number] DOB: [birth year] DOA: 02/05/2017  PCP: Pearson Grippe, MD  Admit date: 02/05/2017 Discharge date: 02/08/2017  Admitted From:Home Disposition:  Home   Recommendations for Outpatient Follow-up:  1. Follow up with PCP in 1-2 weeks 2. Please obtain BMP/CBC in one week    Discharge Condition: Stable CODE STATUS: FULL Diet recommendation: Heart Healthy    Brief/Interim Summary: 50 y.o.malewith medical history of history significant of sCHF (numerous admissions for uncompensated CHF but patient continues to be noncompliant with diet and medications outpatient and has been fired from numerous cardiology clinics secondary to no showing for appointments), atrial fibrillation (on xarelto), HTN, MI, asthma, OSA (no on CPAP or BiPap) presents with progressive leg swelling since discharge from the hospital 2 weeks ago. He states it has been worse over the past 2 days.Patient reports he has been taking his "water pill" as prescribed but that he feels like it just keeps the water in his legs. He felt progressively short of breath and has not been able to lay flat in bed. He was most recently discharged from the hospital after a two-day stay on 01/19/2017. His discharge weight was 315 pounds. However, he also had an admission from 12/02/2016 through 12/06/2016 at which time his discharge weight was 292 pounds. Nevertheless, the patient continues to emphaticallystate that he is compliant with his medications although he has had a well-documented history of poor compliance and dietary indiscretion.  Discharge Diagnoses:  Acute on chronic systolic CHF -His prior dry weight approx ~295 -NEG 12.9L for the admit -NEG 16 lbs for the admit - continuelasix to 60mg  IV BID-->home with bumex 2 mg bid - continue aldactone - 11/10/16 Echo--EF 35-40 percent, diffuse HK, PSP 56, mild MR, +PFO - fluid restriction -mayneed to reset dry weight--likely  be weighing heavier now without fluid retention due to eating habits and sedentary lifestyle. - discharge weight 312 - monitor daily BMP  - pt states he eats "french fries" regularly -laying flat with oxygen saturation 99% on RA  Chronic atrial fibrillation - Continue xarelto - continuecoreg - rate controlled  HTN - decrease coreg due to soft BP -improving with diuresis  GERD - continue protoninx  Elevated Troponin -secondary to CHF - cycle troponins--0.03 x 3  Hyperlipidemia -continue statin  Right>Left leg edema -11/12/16--Right venous duplex--negative for DVT  CKD stage 2 -baseline creatinine 1.0-1.3 -monitor with diuresis    Discharge Instructions  Discharge Instructions    Diet - low sodium heart healthy    Complete by:  As directed    Increase activity slowly    Complete by:  As directed      Allergies as of 02/08/2017      Reactions   Banana Shortness Of Breath   Bee Venom Shortness Of Breath, Swelling, Other (See Comments)   Reaction:  Facial swelling   Food Shortness Of Breath, Rash, Other (See Comments)   Pt states that he is allergic to strawberries.     Lipitor [atorvastatin] Shortness Of Breath, Other (See Comments)   Reaction:  Nose bleeds    Aspirin Other (See Comments)   Reaction:  GI upset    Metolazone Other (See Comments)   Pt states that he stopped taking this med due to heart attack like symptoms.    Oatmeal Nausea And Vomiting   Orange Juice [orange Oil] Nausea And Vomiting   All acidic products make him nauseous and upset stomach   Torsemide Swelling, Other (See Comments)  Reaction:  Swelling of feet/legs    Diltiazem Palpitations   Hydralazine Palpitations      Medication List    STOP taking these medications   furosemide 80 MG tablet Commonly known as:  LASIX     TAKE these medications   albuterol (2.5 MG/3ML) 0.083% nebulizer solution Commonly known as:  PROVENTIL Take 2.5 mg by nebulization every 6 (six)  hours as needed for wheezing or shortness of breath.   aspirin 81 MG chewable tablet Chew 81 mg by mouth as needed for mild pain or moderate pain.   atorvastatin 40 MG tablet Commonly known as:  LIPITOR Take 1 tablet (40 mg total) by mouth daily at 6 PM.   beclomethasone 80 MCG/ACT inhaler Commonly known as:  QVAR Inhale 2 puffs into the lungs 2 (two) times daily.   bumetanide 2 MG tablet Commonly known as:  BUMEX Take 1 tablet (2 mg total) by mouth 2 (two) times daily.   carvedilol 25 MG tablet Commonly known as:  COREG Take 1 tablet (25 mg total) by mouth 2 (two) times daily with a meal.   docusate sodium 100 MG capsule Commonly known as:  COLACE Take 1 capsule (100 mg total) by mouth 2 (two) times daily. What changed:  when to take this  reasons to take this   ferrous gluconate 324 MG tablet Commonly known as:  FERGON Take 1 tablet (324 mg total) by mouth 2 (two) times daily with a meal. What changed:  when to take this   pantoprazole 40 MG tablet Commonly known as:  PROTONIX Take 1 tablet (40 mg total) by mouth daily at 6 (six) AM. What changed:  when to take this   potassium chloride SA 20 MEQ tablet Commonly known as:  K-DUR,KLOR-CON Take 2 tablets (40 mEq total) by mouth daily. What changed:  how much to take   rivaroxaban 20 MG Tabs tablet Commonly known as:  XARELTO Take 1 tablet (20 mg total) by mouth daily with breakfast. What changed:  when to take this   spironolactone 50 MG tablet Commonly known as:  ALDACTONE Take 1 tablet (50 mg total) by mouth daily.   tamsulosin 0.4 MG Caps capsule Commonly known as:  FLOMAX Take 1 capsule (0.4 mg total) by mouth daily.       Allergies  Allergen Reactions  . Banana Shortness Of Breath  . Bee Venom Shortness Of Breath, Swelling and Other (See Comments)    Reaction:  Facial swelling  . Food Shortness Of Breath, Rash and Other (See Comments)    Pt states that he is allergic to strawberries.    .  Lipitor [Atorvastatin] Shortness Of Breath and Other (See Comments)    Reaction:  Nose bleeds   . Aspirin Other (See Comments)    Reaction:  GI upset   . Metolazone Other (See Comments)    Pt states that he stopped taking this med due to heart attack like symptoms.   . Oatmeal Nausea And Vomiting  . Orange Juice [Orange Oil] Nausea And Vomiting    All acidic products make him nauseous and upset stomach  . Torsemide Swelling and Other (See Comments)    Reaction:  Swelling of feet/legs   . Diltiazem Palpitations  . Hydralazine Palpitations    Consultations:  none   Procedures/Studies: Dg Chest 2 View  Result Date: 01/18/2017 CLINICAL DATA:  Increasing shortness of breath. EXAM: CHEST  2 VIEW COMPARISON:  01/09/2017 and 01/07/2017 and 09/05/2016 FINDINGS: Chronic cardiomegaly. Increased  pulmonary vascularity since the prior study. No discrete pulmonary edema or effusions. Bones appear normal. IMPRESSION: Increasing pulmonary vascular congestion.  Chronic cardiomegaly. Electronically Signed   By: Francene Boyers M.D.   On: 01/18/2017 09:04   Dg Chest Portable 1 View  Result Date: 02/05/2017 CLINICAL DATA:  Shortness of breath.  Lower extremity edema. EXAM: PORTABLE CHEST 1 VIEW COMPARISON:  01/28/2017 . FINDINGS: Cardiomegaly with diffuse bilateral pulmonary interstitial prominence and bilateral pleural effusions consistent with congestive heart failure. IMPRESSION: Congestive heart failure with bilateral pulmonary interstitial edema and bilateral pleural effusions. Electronically Signed   By: Maisie Fus  Register   On: 02/05/2017 13:00        Discharge Exam: Vitals:   02/07/17 2100 02/08/17 0636  BP: 112/70 (!) 143/87  Pulse: 64 64  Resp: 16 18  Temp: 98.4 F (36.9 C) 98 F (36.7 C)  SpO2: 100% 99%   Vitals:   02/07/17 1441 02/07/17 2100 02/08/17 0500 02/08/17 0636  BP: 105/70 112/70  (!) 143/87  Pulse: 81 64  64  Resp: 18 16  18   Temp: 98.4 F (36.9 C) 98.4 F (36.9 C)   98 F (36.7 C)  TempSrc: Oral Oral  Oral  SpO2: 100% 100%  99%  Weight:   (!) 141.8 kg (312 lb 9.8 oz)   Height:        General: Pt is alert, awake, not in acute distress Cardiovascular: RRR, S1/S2 +, no rubs, no gallops Respiratory: CTA bilaterally, no wheezing, no rhonchi Abdominal: Soft, NT, ND, bowel sounds + Extremities: 1+RLE edema, no cyanosis   The results of significant diagnostics from this hospitalization (including imaging, microbiology, ancillary and laboratory) are listed below for reference.    Significant Diagnostic Studies: Dg Chest 2 View  Result Date: 01/18/2017 CLINICAL DATA:  Increasing shortness of breath. EXAM: CHEST  2 VIEW COMPARISON:  01/09/2017 and 01/07/2017 and 09/05/2016 FINDINGS: Chronic cardiomegaly. Increased pulmonary vascularity since the prior study. No discrete pulmonary edema or effusions. Bones appear normal. IMPRESSION: Increasing pulmonary vascular congestion.  Chronic cardiomegaly. Electronically Signed   By: Francene Boyers M.D.   On: 01/18/2017 09:04   Dg Chest Portable 1 View  Result Date: 02/05/2017 CLINICAL DATA:  Shortness of breath.  Lower extremity edema. EXAM: PORTABLE CHEST 1 VIEW COMPARISON:  01/28/2017 . FINDINGS: Cardiomegaly with diffuse bilateral pulmonary interstitial prominence and bilateral pleural effusions consistent with congestive heart failure. IMPRESSION: Congestive heart failure with bilateral pulmonary interstitial edema and bilateral pleural effusions. Electronically Signed   By: Maisie Fus  Register   On: 02/05/2017 13:00     Microbiology: No results found for this or any previous visit (from the past 240 hour(s)).   Labs: Basic Metabolic Panel:  Recent Labs Lab 02/05/17 1240 02/06/17 0216 02/07/17 0648 02/08/17 0618  NA 140 143 140 140  K 3.6 3.5 3.5 3.4*  CL 108 106 104 103  CO2 24 29 29 30   GLUCOSE 94 108* 89 107*  BUN 17 16 19  24*  CREATININE 1.14 1.23 1.20 1.33*  CALCIUM 8.9 8.7* 8.6* 8.6*  MG  --   1.9 1.9  --    Liver Function Tests: No results for input(s): AST, ALT, ALKPHOS, BILITOT, PROT, ALBUMIN in the last 168 hours. No results for input(s): LIPASE, AMYLASE in the last 168 hours. No results for input(s): AMMONIA in the last 168 hours. CBC:  Recent Labs Lab 02/05/17 1240 02/07/17 0648  WBC 4.6 3.4*  HGB 10.7* 10.6*  HCT 33.8* 34.3*  MCV 81.1 81.7  PLT 234 219   Cardiac Enzymes:  Recent Labs Lab 02/05/17 1240 02/05/17 2000 02/06/17 0216  TROPONINI 0.03* 0.03* 0.03*   BNP: Invalid input(s): POCBNP CBG: No results for input(s): GLUCAP in the last 168 hours.  Time coordinating discharge:  Greater than 30 minutes  Signed:  Shelda Truby, DO [facility] Pager: [phone] 02/08/2017, 2:24 PM

## 2017-02-08 NOTE — Care Management Note (Signed)
Case Management Note  Patient Details  Name: [name] MRN: [id number] Date of Birth: 07/10/1965  Subjective/Objective:                 Admitted with CHF. CM consult received. Pt well known. Non compliant and has exhausted all OP resources including HH/HRI through multiple agencies and [facility] para-medicine program. Pt lives with his mother and has home oxygen pta. He is ind with ADL's.    Action/Plan: Pt discharging home today with self care  Expected Discharge Date:  02/08/17               Expected Discharge Plan:  Home/Self Care  In-House Referral:  NA  Discharge planning Services  CM Consult  Post Acute Care Choice:  NA Choice offered to:  NA   Status of Service:  Completed, signed off   [name], RN 02/08/2017, 2:32 PM

## 2017-02-08 NOTE — Progress Notes (Signed)
Discussed with patient and mother discharge instructions, they both verbalized agreement and understanding.  Patient to go home in private vehicle with all belongings.

## 2017-02-17 IMAGING — DX DG CHEST 2V
2 series · 2 of 2 positions shown · non-contrast
Comparison: 05/28/2015

CLINICAL DATA: Productive cough, congestion and shortness of breath
for 1 month.

EXAM:
CHEST  2 VIEW

[chest pa]
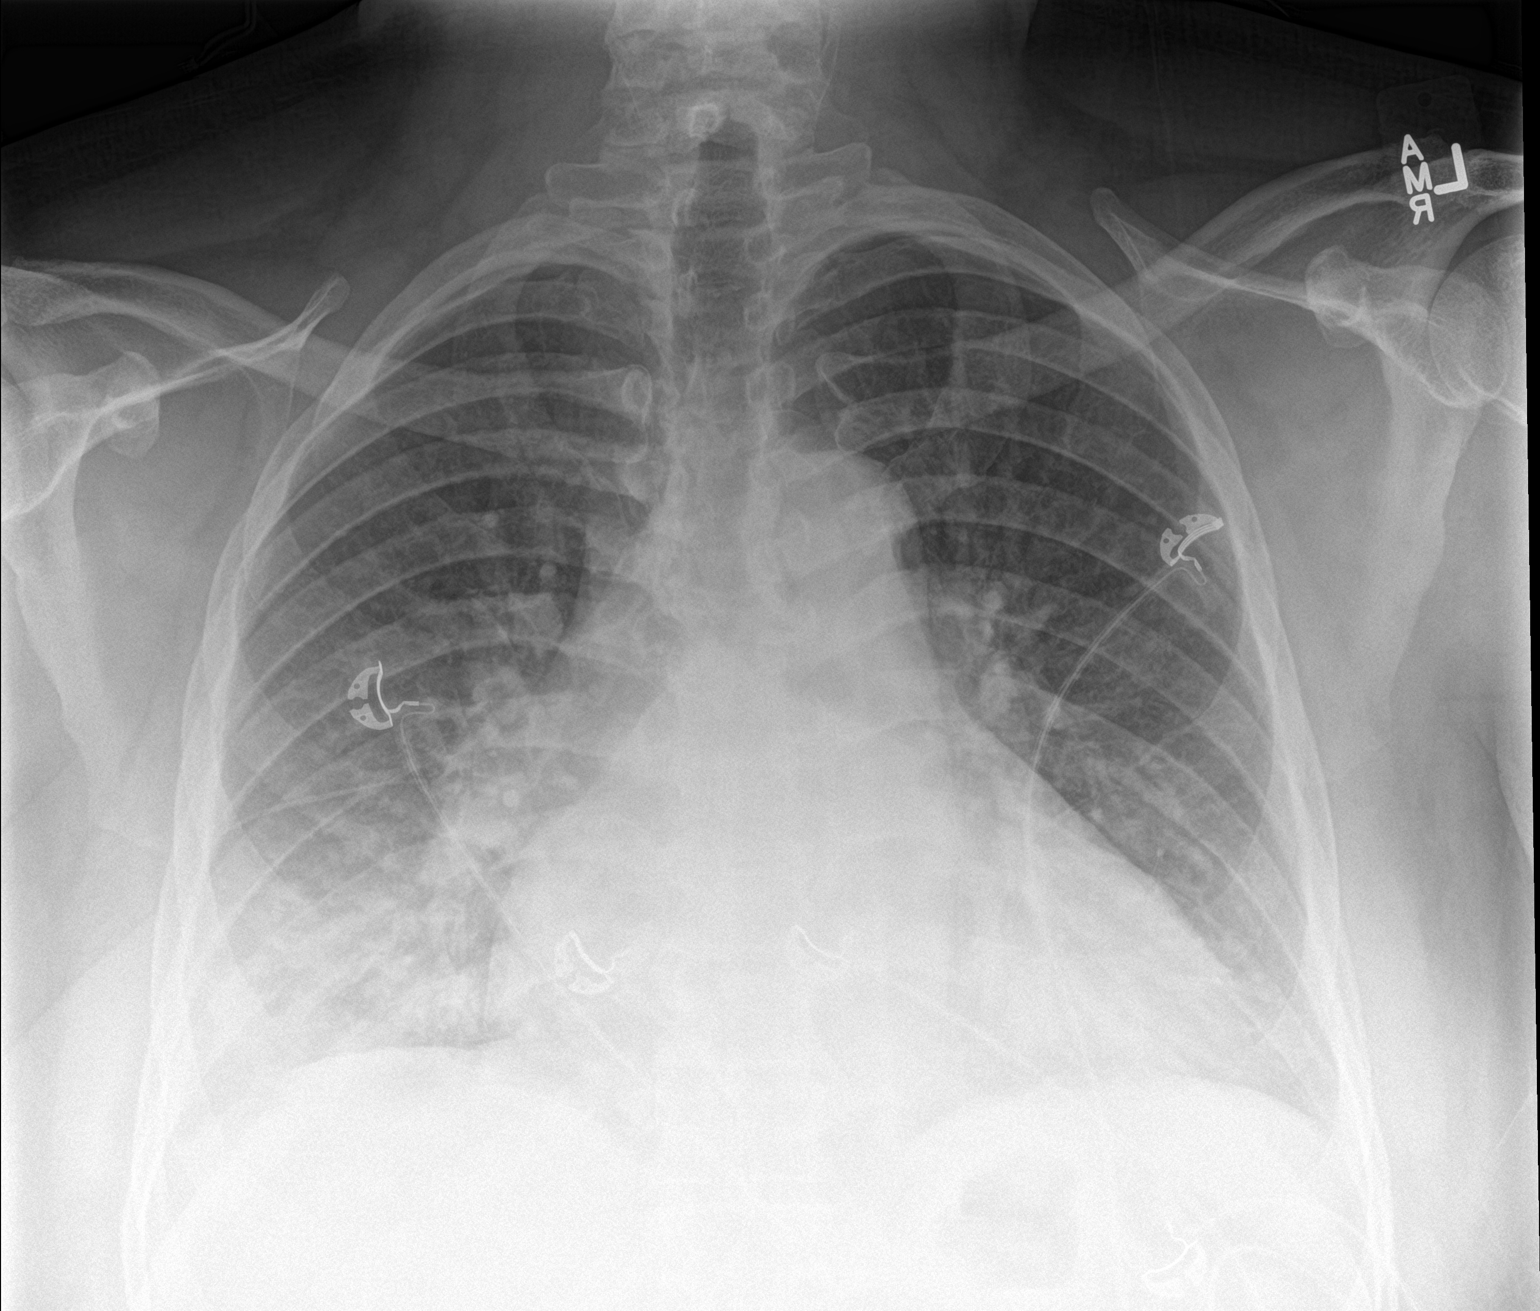

[chest lat]
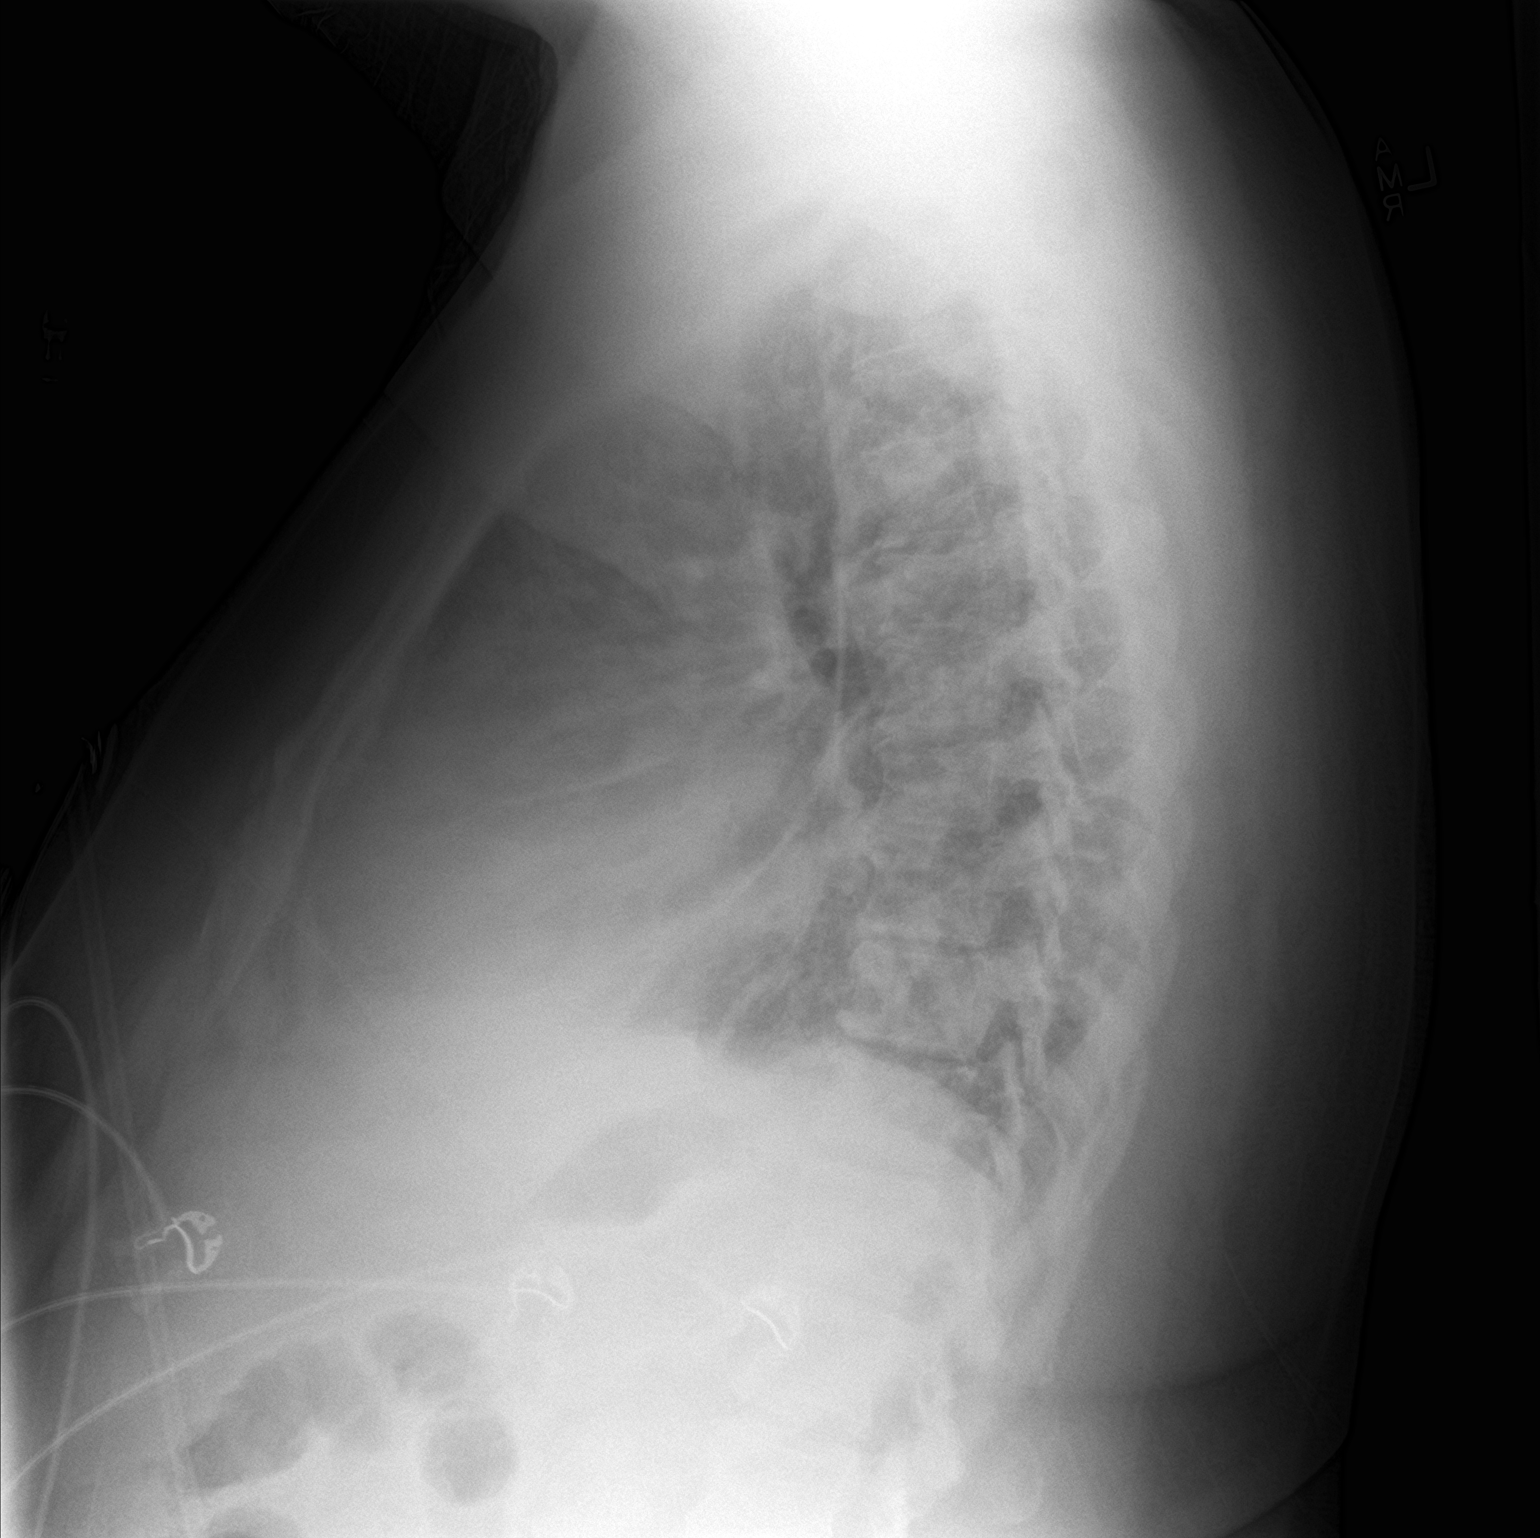

[2 of 2 positions shown; findings below may reference images not displayed]

FINDINGS: Cardiomegaly. Bilateral perihilar and lower lobe opacities,
improving since prior study. Persistent opacities, right greater
than left. No effusions. No acute bony abnormality.
IMPRESSION: Improvement in bilateral perihilar and lower lobe opacities with
continued opacities, right greater than left. This could reflect
improving edema or infection.

Stable cardiomegaly.

## 2017-02-18 ENCOUNTER — Encounter (HOSPITAL_COMMUNITY): Payer: Self-pay | Admitting: *Deleted

## 2017-02-18 ENCOUNTER — Observation Stay (HOSPITAL_COMMUNITY)
Admission: EM | Admit: 2017-02-18 | Discharge: 2017-02-19 | Disposition: A | Discharge status: Home / Self Care | Payer: Medicare Other | Attending: Internal Medicine | Admitting: Internal Medicine

## 2017-02-18 ENCOUNTER — Emergency Department (HOSPITAL_COMMUNITY): Payer: Medicare Other

## 2017-02-18 DIAGNOSIS — Z9114 Patient's other noncompliance with medication regimen: Secondary | ICD-10-CM | POA: Diagnosis not present

## 2017-02-18 DIAGNOSIS — Z87891 Personal history of nicotine dependence: Secondary | ICD-10-CM | POA: Diagnosis not present

## 2017-02-18 DIAGNOSIS — I130 Hypertensive heart and chronic kidney disease with heart failure and stage 1 through stage 4 chronic kidney disease, or unspecified chronic kidney disease: Secondary | ICD-10-CM | POA: Insufficient documentation

## 2017-02-18 DIAGNOSIS — E662 Morbid (severe) obesity with alveolar hypoventilation: Secondary | ICD-10-CM | POA: Diagnosis present

## 2017-02-18 DIAGNOSIS — I272 Pulmonary hypertension, unspecified: Secondary | ICD-10-CM | POA: Diagnosis present

## 2017-02-18 DIAGNOSIS — R7989 Other specified abnormal findings of blood chemistry: Secondary | ICD-10-CM | POA: Diagnosis present

## 2017-02-18 DIAGNOSIS — Z9111 Patient's noncompliance with dietary regimen: Secondary | ICD-10-CM | POA: Diagnosis not present

## 2017-02-18 DIAGNOSIS — I4729 Other ventricular tachycardia: Secondary | ICD-10-CM

## 2017-02-18 DIAGNOSIS — Z7982 Long term (current) use of aspirin: Secondary | ICD-10-CM | POA: Insufficient documentation

## 2017-02-18 DIAGNOSIS — R748 Abnormal levels of other serum enzymes: Secondary | ICD-10-CM | POA: Diagnosis not present

## 2017-02-18 DIAGNOSIS — I5023 Acute on chronic systolic (congestive) heart failure: Secondary | ICD-10-CM | POA: Diagnosis not present

## 2017-02-18 DIAGNOSIS — Z91148 Patient's noncompliance with other medical treatment and regimen due to unspecified reason: Secondary | ICD-10-CM

## 2017-02-18 DIAGNOSIS — N182 Chronic kidney disease, stage 2 (mild): Secondary | ICD-10-CM | POA: Diagnosis not present

## 2017-02-18 DIAGNOSIS — Z7901 Long term (current) use of anticoagulants: Secondary | ICD-10-CM | POA: Insufficient documentation

## 2017-02-18 DIAGNOSIS — Z79899 Other long term (current) drug therapy: Secondary | ICD-10-CM | POA: Insufficient documentation

## 2017-02-18 DIAGNOSIS — I472 Ventricular tachycardia: Secondary | ICD-10-CM

## 2017-02-18 DIAGNOSIS — R531 Weakness: Secondary | ICD-10-CM | POA: Diagnosis not present

## 2017-02-18 DIAGNOSIS — J45909 Unspecified asthma, uncomplicated: Secondary | ICD-10-CM | POA: Diagnosis not present

## 2017-02-18 DIAGNOSIS — R0602 Shortness of breath: Secondary | ICD-10-CM | POA: Diagnosis not present

## 2017-02-18 DIAGNOSIS — R069 Unspecified abnormalities of breathing: Secondary | ICD-10-CM | POA: Diagnosis not present

## 2017-02-18 DIAGNOSIS — R778 Other specified abnormalities of plasma proteins: Secondary | ICD-10-CM | POA: Diagnosis present

## 2017-02-18 DIAGNOSIS — I5020 Unspecified systolic (congestive) heart failure: Principal | ICD-10-CM | POA: Insufficient documentation

## 2017-02-18 DIAGNOSIS — I509 Heart failure, unspecified: Secondary | ICD-10-CM | POA: Diagnosis not present

## 2017-02-18 LAB — CBC
HEMATOCRIT: 34.8 % — AB (ref 39.0–52.0)
HEMOGLOBIN: 10.9 g/dL — AB (ref 13.0–17.0)
MCH: 25.6 pg — AB (ref 26.0–34.0)
MCHC: 31.3 g/dL (ref 30.0–36.0)
MCV: 81.7 fL (ref 78.0–100.0)
Platelets: 205 10*3/uL (ref 150–400)
RBC: 4.26 MIL/uL (ref 4.22–5.81)
RDW: 17.9 % — ABNORMAL HIGH (ref 11.5–15.5)
WBC: 4.2 10*3/uL (ref 4.0–10.5)

## 2017-02-18 LAB — BASIC METABOLIC PANEL
ANION GAP: 9 (ref 5–15)
BUN: 16 mg/dL (ref 6–20)
CHLORIDE: 106 mmol/L (ref 101–111)
CO2: 24 mmol/L (ref 22–32)
Calcium: 8.8 mg/dL — ABNORMAL LOW (ref 8.9–10.3)
Creatinine, Ser: 1.07 mg/dL (ref 0.61–1.24)
GFR calc non Af Amer: >60 mL/min (ref >60)
Glucose, Bld: 102 mg/dL — ABNORMAL HIGH (ref 65–99)
POTASSIUM: 3.6 mmol/L (ref 3.5–5.1)
SODIUM: 139 mmol/L (ref 135–145)

## 2017-02-18 LAB — BRAIN NATRIURETIC PEPTIDE: B Natriuretic Peptide: 720 pg/mL — ABNORMAL HIGH (ref 0.0–100.0)

## 2017-02-18 LAB — TROPONIN I: Troponin I: 0.03 ng/mL (ref <0.03)

## 2017-02-18 MED ORDER — ALBUTEROL SULFATE (2.5 MG/3ML) 0.083% IN NEBU
2.5000 mg | INHALATION_SOLUTION | Freq: Four times a day (QID) | RESPIRATORY_TRACT | Status: DC | PRN
Start: 2017-02-18 — End: 2017-02-19

## 2017-02-18 MED ORDER — ARFORMOTEROL TARTRATE 15 MCG/2ML IN NEBU
15.0000 ug | INHALATION_SOLUTION | Freq: Two times a day (BID) | RESPIRATORY_TRACT | Status: DC
  Administered 2017-02-18 – 2017-02-19 (×2): 15 ug via RESPIRATORY_TRACT
  Filled 2017-02-18 (×2): qty 2

## 2017-02-18 MED ORDER — FERROUS GLUCONATE 324 (38 FE) MG PO TABS
324.0000 mg | ORAL_TABLET | Freq: Every day | ORAL | Status: DC
  Administered 2017-02-19: 324 mg via ORAL
  Filled 2017-02-18 (×4): qty 1

## 2017-02-18 MED ORDER — DOCUSATE SODIUM 100 MG PO CAPS: 100.0000 mg | ORAL_CAPSULE | Freq: Two times a day (BID) | ORAL | Status: DC | PRN

## 2017-02-18 MED ORDER — TAMSULOSIN HCL 0.4 MG PO CAPS
0.4000 mg | ORAL_CAPSULE | Freq: Every day | ORAL | Status: DC
  Administered 2017-02-19: 0.4 mg via ORAL
  Filled 2017-02-18: qty 1

## 2017-02-18 MED ORDER — ATORVASTATIN CALCIUM 40 MG PO TABS
40.0000 mg | ORAL_TABLET | Freq: Every day | ORAL | Status: DC
  Administered 2017-02-18: 40 mg via ORAL
  Filled 2017-02-18: qty 1

## 2017-02-18 MED ORDER — SODIUM CHLORIDE 0.9% FLUSH
3.0000 mL | Freq: Two times a day (BID) | INTRAVENOUS | Status: DC
  Administered 2017-02-18 – 2017-02-19 (×2): 3 mL via INTRAVENOUS

## 2017-02-18 MED ORDER — POTASSIUM CHLORIDE CRYS ER 10 MEQ PO TBCR
20.0000 meq | EXTENDED_RELEASE_TABLET | Freq: Every day | ORAL | Status: DC
  Administered 2017-02-19: 20 meq via ORAL
  Filled 2017-02-18: qty 2

## 2017-02-18 MED ORDER — CARVEDILOL 12.5 MG PO TABS
25.0000 mg | ORAL_TABLET | Freq: Two times a day (BID) | ORAL | Status: DC
  Administered 2017-02-18 – 2017-02-19 (×2): 25 mg via ORAL
  Filled 2017-02-18 (×2): qty 2

## 2017-02-18 MED ORDER — PANTOPRAZOLE SODIUM 40 MG PO TBEC
40.0000 mg | DELAYED_RELEASE_TABLET | Freq: Every evening | ORAL | Status: DC
  Administered 2017-02-18: 40 mg via ORAL
  Filled 2017-02-18: qty 1

## 2017-02-18 MED ORDER — FUROSEMIDE 10 MG/ML IJ SOLN
80.0000 mg | Freq: Once | INTRAMUSCULAR | Status: AC
  Administered 2017-02-18: 80 mg via INTRAVENOUS
  Filled 2017-02-18: qty 8

## 2017-02-18 MED ORDER — FUROSEMIDE 10 MG/ML IJ SOLN
60.0000 mg | Freq: Three times a day (TID) | INTRAMUSCULAR | Status: DC
  Administered 2017-02-18 – 2017-02-19 (×3): 60 mg via INTRAVENOUS
  Filled 2017-02-18 (×3): qty 6

## 2017-02-18 MED ORDER — ASPIRIN 81 MG PO CHEW: 81.0000 mg | CHEWABLE_TABLET | Freq: Every day | ORAL | Status: DC | PRN

## 2017-02-18 MED ORDER — RIVAROXABAN 20 MG PO TABS
20.0000 mg | ORAL_TABLET | Freq: Every day | ORAL | Status: DC
  Administered 2017-02-18: 20 mg via ORAL
  Filled 2017-02-18 (×2): qty 1

## 2017-02-18 MED ORDER — BECLOMETHASONE DIPROPIONATE 80 MCG/ACT IN AERS: 2.0000 | INHALATION_SPRAY | Freq: Two times a day (BID) | RESPIRATORY_TRACT | Status: DC

## 2017-02-18 NOTE — H&P (Signed)
History and Physical    [name] ZOX:[id number] DOB: 11-Dec-1965 DOA: 02/18/2017  PCP: Pearson Grippe, MD  Patient coming from: Home.    Chief Complaint:  SOB.  HPI:    [name] is an 51 y.o. male with frequent hospitalizations for CHF, leg swelling and fluid retention, severe non compliant with meds and diet, having been terminated from several cardiology groups, hx of afib on Xarelto, HTN, MI ashtma, OSA, presented to the ER having SOB.  He has no CP, fever, chills, orthopnea, or coughs.  Evalution in the ER showed CXR with mild vascular congestion but no infiltrates, BNP same as baseline at 700's, and normal Cr with K of 3.6 mE/L  He has no leukocytosis and serology was otherwise unremarkable.  He was given IV Lasix, diuresed, and Hospitalist was asked to admit him for diuresis.  He was found to be hypertensive in the ER, with SBP 170 and DBP of 115.    ED Course:  See above.  Rewiew of Systems:  Constitutional: Negative for malaise, fever and chills. No significant weight loss or weight gain Eyes: Negative for eye pain, redness and discharge, diplopia, visual changes, or flashes of light. ENMT: Negative for ear pain, hoarseness, nasal congestion, sinus pressure and sore throat. No headaches; tinnitus, drooling, or problem swallowing. Cardiovascular: Negative for chest pain, palpitations, diaphoresis. Respiratory: Negative for cough, hemoptysis, wheezing and stridor. No pleuritic chestpain. Gastrointestinal: Negative for diarrhea, constipation,  melena, blood in stool, hematemesis, jaundice and rectal bleeding.    Genitourinary: Negative for frequency, dysuria, incontinence,flank pain and hematuria; Musculoskeletal: Negative for back pain and neck pain. Negative for swelling and trauma.;  Skin: . Negative for pruritus, rash, abrasions, bruising and skin lesion.; ulcerations Neuro: Negative for headache, lightheadedness and neck stiffness. Negative for weakness, altered level of  consciousness , altered mental status, extremity weakness, burning feet, involuntary movement, seizure and syncope.  Psych: negative for anxiety, depression, insomnia, tearfulness, panic attacks, hallucinations, paranoia, suicidal or homicidal ideation    Past Medical History:  Diagnosis Date  . Allergic rhinitis   . Asthma   . Atrial fibrillation (HCC)   . CHF (congestive heart failure) (HCC)   . Essential hypertension   . Gastroesophageal reflux disease   . Heart attack (HCC)   . History of cardiac catheterization    Normal coronaries December 2016  . Noncompliance    Major problem leading to declining health and recurrent hospitalization  . Nonischemic cardiomyopathy (HCC)    LVEF 20-25%  . OSA (obstructive sleep apnea)   . Osteoarthritis   . Peptic ulcer disease     Past Surgical History:  Procedure Laterality Date  . CARDIAC CATHETERIZATION N/A 06/14/2015   Procedure: Right/Left Heart Cath and Coronary Angiography;  Surgeon: Runell Gess, MD;  Location: [facility] INVASIVE CV LAB;  Service: Cardiovascular;  Laterality: N/A;     reports that he quit smoking about 9 years ago. His smoking use included Cigarettes. He started smoking about 28 years ago. He has a 10.00 pack-year smoking history. He has never used smokeless tobacco. He reports that he does not drink alcohol or use drugs.  Allergies  Allergen Reactions  . Banana Shortness Of Breath  . Bee Venom Shortness Of Breath, Swelling and Other (See Comments)    Reaction:  Facial swelling  . Food Shortness Of Breath, Rash and Other (See Comments)    Pt states that he is allergic to strawberries.    . Lipitor [Atorvastatin] Shortness Of Breath and  Other (See Comments)    Reaction:  Nose bleeds   . Aspirin Other (See Comments)    Reaction:  GI upset   . Metolazone Other (See Comments)    Pt states that he stopped taking this med due to heart attack like symptoms.   . Oatmeal Nausea And Vomiting  . Orange Juice [Orange Oil]  Nausea And Vomiting    All acidic products make him nauseous and upset stomach  . Torsemide Swelling and Other (See Comments)    Reaction:  Swelling of feet/legs   . Diltiazem Palpitations  . Hydralazine Palpitations    Family History  Problem Relation Age of Onset  . Stroke Father   . Heart attack Father   . Aneurysm Mother        Cerebral aneurysm  . Hypertension Sister   . Colon cancer Neg Hx   . Inflammatory bowel disease Neg Hx   . Liver disease Neg Hx      Prior to Admission medications   Medication Sig Start Date End Date Taking? Authorizing Provider  albuterol (PROVENTIL) (2.5 MG/3ML) 0.083% nebulizer solution Take 2.5 mg by nebulization every 6 (six) hours as needed for wheezing or shortness of breath.   Yes [provider]  aspirin 81 MG chewable tablet Chew 81 mg by mouth as needed for mild pain or moderate pain.    Yes [provider]  atorvastatin (LIPITOR) 40 MG tablet Take 1 tablet (40 mg total) by mouth daily at 6 PM. 06/25/16  Yes Kathlen Mody, MD  beclomethasone (QVAR) 80 MCG/ACT inhaler Inhale 2 puffs into the lungs 2 (two) times daily.    Yes [provider]  bumetanide (BUMEX) 2 MG tablet Take 1 tablet (2 mg total) by mouth 2 (two) times daily. 11/13/16  Yes Tat, Onalee Hua, MD  carvedilol (COREG) 25 MG tablet Take 1 tablet (25 mg total) by mouth 2 (two) times daily with a meal. 11/13/16  Yes Tat, Onalee Hua, MD  docusate sodium (COLACE) 100 MG capsule Take 1 capsule (100 mg total) by mouth 2 (two) times daily. Patient taking differently: Take 100 mg by mouth 2 (two) times daily as needed for mild constipation.  06/25/16  Yes Kathlen Mody, MD  ferrous gluconate (FERGON) 324 MG tablet Take 1 tablet (324 mg total) by mouth 2 (two) times daily with a meal. Patient taking differently: Take 324 mg by mouth daily with breakfast.  06/25/16  Yes Kathlen Mody, MD  pantoprazole (PROTONIX) 40 MG tablet Take 1 tablet (40 mg total) by mouth daily at 6 (six)  AM. Patient taking differently: Take 40 mg by mouth every evening.  06/26/16  Yes Kathlen Mody, MD  potassium chloride SA (K-DUR,KLOR-CON) 20 MEQ tablet Take 2 tablets (40 mEq total) by mouth daily. Patient taking differently: Take 10-40 mEq by mouth daily.  10/27/16  Yes Filbert Schilder, MD  rivaroxaban (XARELTO) 20 MG TABS tablet Take 1 tablet (20 mg total) by mouth daily with breakfast. Patient taking differently: Take 20 mg by mouth daily with supper.  06/26/16  Yes Kathlen Mody, MD  spironolactone (ALDACTONE) 50 MG tablet Take 1 tablet (50 mg total) by mouth daily. 10/02/16  Yes Houston Siren, MD  tamsulosin (FLOMAX) 0.4 MG CAPS capsule Take 1 capsule (0.4 mg total) by mouth daily. 06/25/16  Yes Kathlen Mody, MD    Physical Exam: Vitals:   02/18/17 1315 02/18/17 1330 02/18/17 1400 02/18/17 1430  BP:  (!) 164/102 (!) 167/121 (!) 156/117  Pulse: (!) 50 Marland Kitchen)  43  (!) 50  Resp: 15 14 (!) 21 (!) 27  Temp:      TempSrc:      SpO2: 97% 97%  100%  Weight:      Height:       Constitutional: NAD, calm, comfortable Vitals:   02/18/17 1315 02/18/17 1330 02/18/17 1400 02/18/17 1430  BP:  (!) 164/102 (!) 167/121 (!) 156/117  Pulse: (!) 50 (!) 43  (!) 50  Resp: 15 14 (!) 21 (!) 27  Temp:      TempSrc:      SpO2: 97% 97%  100%  Weight:      Height:       Eyes: PERRL, lids and conjunctivae normal ENMT: Mucous membranes are moist. Posterior pharynx clear of any exudate or lesions.Normal dentition.  Neck: normal, supple, no masses, no thyromegaly Respiratory: clear to auscultation bilaterally, no wheezing, no crackles. Normal respiratory effort. No accessory muscle use.  Cardiovascular: Regular rate and rhythm, no murmurs / rubs / gallops. No extremity edema. 2+ pedal pulses. No carotid bruits.  Abdomen: no tenderness, no masses palpated. No hepatosplenomegaly. Bowel sounds positive.  Musculoskeletal: no clubbing / cyanosis. No joint deformity upper and lower extremities. Good ROM, no  contractures. Normal muscle tone.  Skin: no rashes, lesions, ulcers. No induration Neurologic: CN 2-12 grossly intact. Sensation intact, DTR normal. Strength 5/5 in all 4.  Psychiatric: Normal judgment and insight. Alert and oriented x 3. Normal mood.   Labs on Admission: I have personally reviewed following labs and imaging studies CBC:  Recent Labs Lab 02/18/17 1145  WBC 4.2  HGB 10.9*  HCT 34.8*  MCV 81.7  PLT 205   Basic Metabolic Panel:  Recent Labs Lab 02/18/17 1145  NA 139  K 3.6  CL 106  CO2 24  GLUCOSE 102*  BUN 16  CREATININE 1.07  CALCIUM 8.8*   Cardiac Enzymes:  Recent Labs Lab 02/18/17 1145  TROPONINI 0.03*   Urine analysis:    Component Value Date/Time   COLORURINE AMBER (A) 10/24/2016 0923   APPEARANCEUR CLEAR 10/24/2016 0923   LABSPEC 1.027 10/24/2016 0923   PHURINE 5.0 10/24/2016 0923   GLUCOSEU NEGATIVE 10/24/2016 0923   GLUCOSEU NEG mg/dL 16/03/9603 5409   HGBUR NEGATIVE 10/24/2016 0923   BILIRUBINUR NEGATIVE 10/24/2016 0923   KETONESUR NEGATIVE 10/24/2016 0923   PROTEINUR 100 (A) 10/24/2016 0923   UROBILINOGEN 0.2 08/08/2014 1445   NITRITE NEGATIVE 10/24/2016 0923   LEUKOCYTESUR NEGATIVE 10/24/2016 0923   Radiological Exams on Admission: Dg Chest Portable 1 View  Result Date: 02/18/2017 CLINICAL DATA:  Two-day history of shortness of breath and bilateral lower extremity edema. Current history of nonischemic cardiomyopathy and CHF. Prior MI. EXAM: PORTABLE CHEST 1 VIEW COMPARISON:  02/05/2017, 01/28/2017 and earlier. FINDINGS: Cardiac silhouette markedly enlarged, unchanged. Pulmonary venous hypertension and minimal to mild interstitial pulmonary edema, similar to the most recent examination 2 weeks ago. Small bilateral pleural effusions suspected. No confluent airspace consolidation. IMPRESSION: Minimal to mild CHF, with stable marked cardiomegaly and minimal to mild interstitial pulmonary edema. Likely small bilateral pleural effusions.  Electronically Signed   By: Hulan Saas M.D.   On: 02/18/2017 12:50    EKG: Independently reviewed.   Assessment/Plan Active Problems:   OBESITY, MORBID   Obesity hypoventilation syndrome (HCC)   Pulmonary hypertension (HCC)   Noncompliance with diet and medication regimen   Elevated troponin   NSVT (nonsustained ventricular tachycardia) (HCC)   Acute on chronic systolic CHF (congestive heart failure) (HCC)  CHF, acute on chronic (HCC)   PLAN:   Acute on chronic systolic CHF:  Will continue with IV Lasix at 60mg  every shift.  Check daily lytes and BUN /Cr.  Daily weights and I and O.    Pulmonary HTN:  WIll continue with meds.  CKD:  Stable.  Follow with aggressive diuresis.   HTN:  Continue with meds.  Should improve with diuresis.  Will use PRN Hydralazine.    DVT prophylaxis:  Xarelto. Code Status: FULL CODE.  Family Communication:  None at bedside. Disposition Plan: Home.  Consults called: None. Admission status: Inpatient.    Cleto Claggett MD FACP. [facility]  If 7PM-7AM, please contact night-coverage [email or web address] Password [email or web address]  02/18/2017, 3:35 PM

## 2017-02-18 NOTE — ED Notes (Signed)
Pt stable and ready for transport to [facility]. Report given to Joneen Boers, RN.

## 2017-02-18 NOTE — ED Notes (Signed)
CRITICAL VALUE ALERT  Critical Value:  Troponin = 0.03  Date & Time Notied:  02/18/17  Provider Notified: Estell Harpin  Orders Received/Actions taken:

## 2017-02-18 NOTE — ED Provider Notes (Signed)
[facility] Provider Note   CSN: [id number] Arrival date & time: 02/18/17  1124     History   Chief Complaint Chief Complaint  Patient presents with  . Leg Swelling    HPI [name] is a 51 y.o. male.  Patient complains of worsening swelling in his legs and abdomen and some shortness of breath. He states that he's gained 34 pounds patient has a history congestive heart failure and has been admitted about 3 weeks ago   The history is provided by the patient.  Weakness  Primary symptoms include no focal weakness. This is a chronic problem. The current episode started more than 1 week ago. The problem has not changed since onset.There was no focality noted. There has been no fever. Associated symptoms include shortness of breath. Pertinent negatives include no chest pain and no headaches. There were no medications administered prior to arrival. Associated medical issues do not include trauma.    Past Medical History:  Diagnosis Date  . Allergic rhinitis   . Asthma   . Atrial fibrillation (HCC)   . CHF (congestive heart failure) (HCC)   . Essential hypertension   . Gastroesophageal reflux disease   . Heart attack (HCC)   . History of cardiac catheterization    Normal coronaries December 2016  . Noncompliance    Major problem leading to declining health and recurrent hospitalization  . Nonischemic cardiomyopathy (HCC)    LVEF 20-25%  . OSA (obstructive sleep apnea)   . Osteoarthritis   . Peptic ulcer disease     Patient Active Problem List   Diagnosis Date Noted  . CHF, acute on chronic (HCC) 02/18/2017  . Acute systolic heart failure (HCC) 01/18/2017  . Pedal edema 12/02/2016  . Acute CHF (congestive heart failure) (HCC) 11/09/2016  . Acute on chronic systolic CHF (congestive heart failure) (HCC) 08/19/2016  . CKD (chronic kidney disease), stage II 05/17/2016  . Coronary arteries, normal Dec 2016 01/23/2016  . Chronic anticoagulation-Xarelto 01/23/2016    . NSVT (nonsustained ventricular tachycardia) (HCC) 01/23/2016  . Elevated troponin 12/21/2015  . Nonischemic cardiomyopathy (HCC)   . Morbid obesity due to excess calories (HCC)   . Noncompliance with diet and medication regimen 12/02/2015  . OSA (obstructive sleep apnea) 09/20/2015  . Pulmonary hypertension (HCC) 09/19/2015  . Normocytic anemia 09/19/2015  . Chronic atrial fibrillation (HCC)   . Dyspnea on exertion 05/28/2015  . Acute respiratory failure with hypoxia (HCC) 04/01/2015  . Hypokalemia 04/01/2015  . Obesity hypoventilation syndrome (HCC) 08/08/2014  . GERD (gastroesophageal reflux disease) 08/08/2014  . Edema of right lower extremity 06/21/2014  . Fecal urgency 06/21/2014  . Asthma 02/11/2009  . Hyperlipidemia 07/12/2008  . OBESITY, MORBID 07/12/2008  . Anxiety state 07/12/2008  . DEPRESSION 07/12/2008  . Essential hypertension 07/12/2008  . ALLERGIC RHINITIS 07/12/2008  . Peptic ulcer 07/12/2008  . OSTEOARTHRITIS 07/12/2008    Past Surgical History:  Procedure Laterality Date  . CARDIAC CATHETERIZATION N/A 06/14/2015   Procedure: Right/Left Heart Cath and Coronary Angiography;  Surgeon: Runell Gess, MD;  Location: [facility] INVASIVE CV LAB;  Service: Cardiovascular;  Laterality: N/A;       Home Medications    Prior to Admission medications   Medication Sig Start Date End Date Taking? Authorizing Provider  albuterol (PROVENTIL) (2.5 MG/3ML) 0.083% nebulizer solution Take 2.5 mg by nebulization every 6 (six) hours as needed for wheezing or shortness of breath.   Yes [provider]  aspirin 81 MG chewable tablet  Chew 81 mg by mouth as needed for mild pain or moderate pain.    Yes [provider]  atorvastatin (LIPITOR) 40 MG tablet Take 1 tablet (40 mg total) by mouth daily at 6 PM. 06/25/16  Yes Kathlen Mody, MD  beclomethasone (QVAR) 80 MCG/ACT inhaler Inhale 2 puffs into the lungs 2 (two) times daily.    Yes [provider]   bumetanide (BUMEX) 2 MG tablet Take 1 tablet (2 mg total) by mouth 2 (two) times daily. 11/13/16  Yes Tat, Onalee Hua, MD  carvedilol (COREG) 25 MG tablet Take 1 tablet (25 mg total) by mouth 2 (two) times daily with a meal. 11/13/16  Yes Tat, Onalee Hua, MD  docusate sodium (COLACE) 100 MG capsule Take 1 capsule (100 mg total) by mouth 2 (two) times daily. Patient taking differently: Take 100 mg by mouth 2 (two) times daily as needed for mild constipation.  06/25/16  Yes Kathlen Mody, MD  ferrous gluconate (FERGON) 324 MG tablet Take 1 tablet (324 mg total) by mouth 2 (two) times daily with a meal. Patient taking differently: Take 324 mg by mouth daily with breakfast.  06/25/16  Yes Kathlen Mody, MD  pantoprazole (PROTONIX) 40 MG tablet Take 1 tablet (40 mg total) by mouth daily at 6 (six) AM. Patient taking differently: Take 40 mg by mouth every evening.  06/26/16  Yes Kathlen Mody, MD  potassium chloride SA (K-DUR,KLOR-CON) 20 MEQ tablet Take 2 tablets (40 mEq total) by mouth daily. Patient taking differently: Take 10-40 mEq by mouth daily.  10/27/16  Yes Filbert Schilder, MD  rivaroxaban (XARELTO) 20 MG TABS tablet Take 1 tablet (20 mg total) by mouth daily with breakfast. Patient taking differently: Take 20 mg by mouth daily with supper.  06/26/16  Yes Kathlen Mody, MD  spironolactone (ALDACTONE) 50 MG tablet Take 1 tablet (50 mg total) by mouth daily. 10/02/16  Yes Houston Siren, MD  tamsulosin (FLOMAX) 0.4 MG CAPS capsule Take 1 capsule (0.4 mg total) by mouth daily. 06/25/16  Yes Kathlen Mody, MD    Family History Family History  Problem Relation Age of Onset  . Stroke Father   . Heart attack Father   . Aneurysm Mother        Cerebral aneurysm  . Hypertension Sister   . Colon cancer Neg Hx   . Inflammatory bowel disease Neg Hx   . Liver disease Neg Hx     Social History Social History  Substance Use Topics  . Smoking status: Former Smoker    Packs/day: 0.50    Years: 20.00    Types:  Cigarettes    Start date: 04/26/1988    Quit date: 06/23/2007  . Smokeless tobacco: Never Used     Comment: 1 ppd former smoker  . Alcohol use No     Comment: No etoh since 2009     Allergies   Banana; Bee venom; Food; Lipitor [atorvastatin]; Aspirin; Metolazone; Oatmeal; Orange juice [orange oil]; Torsemide; Diltiazem; and Hydralazine   Review of Systems Review of Systems  Constitutional: Negative for appetite change and fatigue.  HENT: Negative for congestion, ear discharge and sinus pressure.   Eyes: Negative for discharge.  Respiratory: Positive for shortness of breath. Negative for cough.   Cardiovascular: Negative for chest pain.  Gastrointestinal: Negative for abdominal pain and diarrhea.  Genitourinary: Negative for frequency and hematuria.  Musculoskeletal: Negative for back pain.       Swelling to legs  Skin: Negative for rash.  Neurological: Positive  for weakness. Negative for focal weakness, seizures and headaches.  Psychiatric/Behavioral: Negative for hallucinations.     Physical Exam Updated Vital Signs BP (!) 149/98   Pulse 95   Temp 98.7 F (37.1 C) (Oral)   Resp (!) 27   Ht 6' (1.829 m)   Wt (!) 155.7 kg (343 lb 4 oz)   SpO2 100%   BMI 46.55 kg/m   Physical Exam  Constitutional: He is oriented to person, place, and time. He appears well-developed.  HENT:  Head: Normocephalic.  Eyes: Conjunctivae and EOM are normal. No scleral icterus.  Neck: Neck supple. No thyromegaly present.  Cardiovascular: Normal rate and regular rhythm.  Exam reveals no gallop and no friction rub.   No murmur heard. Pulmonary/Chest: No stridor. He has no wheezes. He has no rales. He exhibits no tenderness.  Abdominal: He exhibits no distension. There is no tenderness. There is no rebound.  Musculoskeletal: Normal range of motion. He exhibits edema.  Lymphadenopathy:    He has no cervical adenopathy.  Neurological: He is oriented to person, place, and time. He exhibits  normal muscle tone. Coordination normal.  Skin: No rash noted. No erythema.  Psychiatric: He has a normal mood and affect. His behavior is normal.     ED Treatments / Results  Labs (all labs ordered are listed, but only abnormal results are displayed) Labs Reviewed  BASIC METABOLIC PANEL - Abnormal; Notable for the following:       Result Value   Glucose, Bld 102 (*)    Calcium 8.8 (*)    All other components within normal limits  CBC - Abnormal; Notable for the following:    Hemoglobin 10.9 (*)    HCT 34.8 (*)    MCH 25.6 (*)    RDW 17.9 (*)    All other components within normal limits  TROPONIN I - Abnormal; Notable for the following:    Troponin I 0.03 (*)    All other components within normal limits  BRAIN NATRIURETIC PEPTIDE - Abnormal; Notable for the following:    B Natriuretic Peptide 720.0 (*)    All other components within normal limits    EKG  EKG Interpretation  Date/Time:  Thursday February 18 2017 11:31:56 EDT Ventricular Rate:  102 PR Interval:    QRS Duration: 93 QT Interval:  362 QTC Calculation: 472 R Axis:   68 Text Interpretation:  Atrial fibrillation Ventricular premature complex Nonspecific T abnormalities, lateral leads Baseline wander in lead(s) V1 similar to previous Confirmed by Crista Curb [phone]) on 02/18/2017 11:44:12 AM       Radiology Dg Chest Portable 1 View  Result Date: 02/18/2017 CLINICAL DATA:  Two-day history of shortness of breath and bilateral lower extremity edema. Current history of nonischemic cardiomyopathy and CHF. Prior MI. EXAM: PORTABLE CHEST 1 VIEW COMPARISON:  02/05/2017, 01/28/2017 and earlier. FINDINGS: Cardiac silhouette markedly enlarged, unchanged. Pulmonary venous hypertension and minimal to mild interstitial pulmonary edema, similar to the most recent examination 2 weeks ago. Small bilateral pleural effusions suspected. No confluent airspace consolidation. IMPRESSION: Minimal to mild CHF, with stable marked cardiomegaly  and minimal to mild interstitial pulmonary edema. Likely small bilateral pleural effusions. Electronically Signed   By: Hulan Saas M.D.   On: 02/18/2017 12:50    Procedures Procedures (including critical care time)  Medications Ordered in ED Medications  furosemide (LASIX) injection 80 mg (80 mg Intravenous Given 02/18/17 1229)     Initial Impression / Assessment and Plan / ED Course  I have reviewed the triage vital signs and the nursing notes.  Pertinent labs & imaging results that were available during my care of the patient were reviewed by me and considered in my medical decision making (see chart for details). Patient with congestive heart failure and anasarca. Also patient's blood pressure poorly controlled he will be admitted for diuresis and better control with blood pressure      Final Clinical Impressions(s) / ED Diagnoses   Final diagnoses:  Systolic congestive heart failure, unspecified HF chronicity (HCC)    New Prescriptions New Prescriptions   No medications on file     Bethann Berkshire, MD 02/18/17 1556

## 2017-02-18 NOTE — ED Triage Notes (Signed)
Pt sent from Dr Megan Mans office for fluid retention and weight change of 34 lbs increase over 2 weeks.

## 2017-02-19 DIAGNOSIS — R748 Abnormal levels of other serum enzymes: Secondary | ICD-10-CM | POA: Diagnosis not present

## 2017-02-19 DIAGNOSIS — Z9111 Patient's noncompliance with dietary regimen: Secondary | ICD-10-CM | POA: Diagnosis not present

## 2017-02-19 DIAGNOSIS — Z9114 Patient's other noncompliance with medication regimen: Secondary | ICD-10-CM | POA: Diagnosis not present

## 2017-02-19 DIAGNOSIS — I5023 Acute on chronic systolic (congestive) heart failure: Secondary | ICD-10-CM | POA: Diagnosis not present

## 2017-02-19 DIAGNOSIS — I5020 Unspecified systolic (congestive) heart failure: Secondary | ICD-10-CM | POA: Diagnosis not present

## 2017-02-19 LAB — BASIC METABOLIC PANEL
ANION GAP: 7 (ref 5–15)
BUN: 16 mg/dL (ref 6–20)
CO2: 31 mmol/L (ref 22–32)
Calcium: 8.5 mg/dL — ABNORMAL LOW (ref 8.9–10.3)
Chloride: 104 mmol/L (ref 101–111)
Creatinine, Ser: 1.13 mg/dL (ref 0.61–1.24)
GFR calc Af Amer: >60 mL/min (ref >60)
GLUCOSE: 90 mg/dL (ref 65–99)
POTASSIUM: 3.4 mmol/L — AB (ref 3.5–5.1)
Sodium: 142 mmol/L (ref 135–145)

## 2017-02-19 LAB — CBC
HCT: 35.8 % — ABNORMAL LOW (ref 39.0–52.0)
Hemoglobin: 11.1 g/dL — ABNORMAL LOW (ref 13.0–17.0)
MCH: 25.6 pg — ABNORMAL LOW (ref 26.0–34.0)
MCHC: 31 g/dL (ref 30.0–36.0)
MCV: 82.5 fL (ref 78.0–100.0)
Platelets: 194 10*3/uL (ref 150–400)
RBC: 4.34 MIL/uL (ref 4.22–5.81)
RDW: 17.8 % — ABNORMAL HIGH (ref 11.5–15.5)
WBC: 3.5 10*3/uL — ABNORMAL LOW (ref 4.0–10.5)

## 2017-02-19 MED ORDER — POTASSIUM CHLORIDE CRYS ER 20 MEQ PO TBCR: 20.0000 meq | EXTENDED_RELEASE_TABLET | Freq: Every day | ORAL | 1 refills | Status: DC

## 2017-02-19 NOTE — Care Management Note (Signed)
Case Management Note  Patient Details  Name: [name] MRN: [id number] Date of Birth: 09/24/1965     CM consult received for CHf assessment. Pt from home, grossly non-compliant with medication, diet, appointments. Lives with mother, ind with ADL's. Has all necessary DME. Will DC home today. No resources available for pt in community until he changes his non-compliant behavior.    Expected Discharge Date:  02/19/17               Expected Discharge Plan:  Home/Self Care  In-House Referral:  NA  Discharge planning Services  CM Consult  Post Acute Care Choice:  NA Choice offered to:  NA  Status of Service:  Completed, signed off  [name], RN 02/19/2017, 10:32 AM

## 2017-02-19 NOTE — Progress Notes (Signed)
Pt IV removed, WNL. D/C instructions given to pt. Verbalized understanding. Pt called family to transport home.

## 2017-02-19 NOTE — Discharge Summary (Signed)
Physician Discharge Summary  [name]:[id number] DOB: [birth year] DOA: 02/18/2017  PCP: Pearson Grippe, MD  Admit date: 02/18/2017 Discharge date: 02/19/2017  Admitted From: Home.  Disposition:  To home.   Recommendations for Outpatient Follow-up:  1. Follow up with PCP in 1-2 weeks 2. Follow up with cardiology early next week.  Schedule is being made.  Home Health:  None.  Equipment/Devices:  None.  Discharge Condition: No SOB.   CODE STATUS: FULL CODE.  Diet recommendation: Heart healthy diet.   Brief/Interim Summary:  Patient was admitted into the hospital by me for another episode of CHF, due to non compliance, on Feb 18, 2017.  As per my prior H and P:  " Samuel T Archeris an 51 y.o.malewith frequent hospitalizations for CHF, leg swelling and fluid retention, severe non compliant with meds and diet, having been terminated from several cardiology groups, hx of afib on Xarelto, HTN, MI ashtma, OSA, presented to the ER having SOB. He has no CP, fever, chills, orthopnea, or coughs. Evalution in the ER showed CXR with mild vascular congestion but no infiltrates, BNP same as baseline at 700's, and normal Cr with K of 3.6 mE/L He has no leukocytosis and serology was otherwise unremarkable. He was given IV Lasix, diuresed, andHospitalist was asked to admit him for diuresis. He was found to be hypertensive in the ER, with SBP 170 and DBP of 115.    ED Course:  See above.  HOSPITAL COURSE:   Patient has had several admissions for acute CHF, likely as he is non compliance with his medications.  In talking to him, I am not convinced he has been taking his meds as directed.   With IV Lasix, he diused about 5.7L and felt markedly better.  He has no light headednes.  His Cr on discharge was  1.1.  His troponin was at 0.03.  He wants to go home today, and is stable for discharge.   He will follow up with his PCP next week, and with his cardiology next week.  Will make appointment early next week  if possible with his cardiologist.   Discharge Diagnoses:  Active Problems:   OBESITY, MORBID   Obesity hypoventilation syndrome (HCC)   Pulmonary hypertension (HCC)   Noncompliance with diet and medication regimen   Elevated troponin   NSVT (nonsustained ventricular tachycardia) (HCC)   Acute on chronic systolic CHF (congestive heart failure) (HCC)   CHF, acute on chronic [facility]    Discharge Instructions  Discharge Instructions    Diet - low sodium heart healthy    Complete by:  As directed    Discharge instructions    Complete by:  As directed    Please take your medication as directed.  Follow up with cardiology early next week.   Increase activity slowly    Complete by:  As directed      Allergies as of 02/19/2017      Reactions   Banana Shortness Of Breath   Bee Venom Shortness Of Breath, Swelling, Other (See Comments)   Reaction:  Facial swelling   Food Shortness Of Breath, Rash, Other (See Comments)   Pt states that he is allergic to strawberries.     Lipitor [atorvastatin] Shortness Of Breath, Other (See Comments)   Reaction:  Nose bleeds    Aspirin Other (See Comments)   Reaction:  GI upset    Metolazone Other (See Comments)   Pt states that he stopped taking this med due to heart  attack like symptoms.    Oatmeal Nausea And Vomiting   Orange Juice [orange Oil] Nausea And Vomiting   All acidic products make him nauseous and upset stomach   Torsemide Swelling, Other (See Comments)   Reaction:  Swelling of feet/legs    Diltiazem Palpitations   Hydralazine Palpitations      Medication List    STOP taking these medications   spironolactone 50 MG tablet Commonly known as:  ALDACTONE     TAKE these medications   albuterol (2.5 MG/3ML) 0.083% nebulizer solution Commonly known as:  PROVENTIL Take 2.5 mg by nebulization every 6 (six) hours as needed for wheezing or shortness of breath.   aspirin 81 MG chewable tablet Chew 81 mg by mouth as needed for mild  pain or moderate pain.   atorvastatin 40 MG tablet Commonly known as:  LIPITOR Take 1 tablet (40 mg total) by mouth daily at 6 PM.   beclomethasone 80 MCG/ACT inhaler Commonly known as:  QVAR Inhale 2 puffs into the lungs 2 (two) times daily.   bumetanide 2 MG tablet Commonly known as:  BUMEX Take 1 tablet (2 mg total) by mouth 2 (two) times daily.   carvedilol 25 MG tablet Commonly known as:  COREG Take 1 tablet (25 mg total) by mouth 2 (two) times daily with a meal.   docusate sodium 100 MG capsule Commonly known as:  COLACE Take 1 capsule (100 mg total) by mouth 2 (two) times daily. What changed:  when to take this  reasons to take this   ferrous gluconate 324 MG tablet Commonly known as:  FERGON Take 1 tablet (324 mg total) by mouth 2 (two) times daily with a meal. What changed:  when to take this   pantoprazole 40 MG tablet Commonly known as:  PROTONIX Take 1 tablet (40 mg total) by mouth daily at 6 (six) AM. What changed:  when to take this   potassium chloride SA 20 MEQ tablet Commonly known as:  K-DUR,KLOR-CON Take 1 tablet (20 mEq total) by mouth daily. What changed:  how much to take   rivaroxaban 20 MG Tabs tablet Commonly known as:  XARELTO Take 1 tablet (20 mg total) by mouth daily with breakfast. What changed:  when to take this   tamsulosin 0.4 MG Caps capsule Commonly known as:  FLOMAX Take 1 capsule (0.4 mg total) by mouth daily.            Discharge Care Instructions        Start     Ordered   02/20/17 0000  potassium chloride (K-DUR,KLOR-CON) 20 MEQ tablet  Daily     02/19/17 0949   02/19/17 0000  Increase activity slowly     02/19/17 0949   02/19/17 0000  Diet - low sodium heart healthy     02/19/17 0949   02/19/17 0000  Discharge instructions    Comments:  Please take your medication as directed.  Follow up with cardiology early next week.   02/19/17 0949      Allergies  Allergen Reactions  . Banana Shortness Of Breath   . Bee Venom Shortness Of Breath, Swelling and Other (See Comments)    Reaction:  Facial swelling  . Food Shortness Of Breath, Rash and Other (See Comments)    Pt states that he is allergic to strawberries.    . Lipitor [Atorvastatin] Shortness Of Breath and Other (See Comments)    Reaction:  Nose bleeds   . Aspirin Other (See  Comments)    Reaction:  GI upset   . Metolazone Other (See Comments)    Pt states that he stopped taking this med due to heart attack like symptoms.   . Oatmeal Nausea And Vomiting  . Orange Juice [Orange Oil] Nausea And Vomiting    All acidic products make him nauseous and upset stomach  . Torsemide Swelling and Other (See Comments)    Reaction:  Swelling of feet/legs   . Diltiazem Palpitations  . Hydralazine Palpitations    Consultations:  NONE>   Procedures/Studies: Dg Chest Portable 1 View  Result Date: 02/18/2017 CLINICAL DATA:  Two-day history of shortness of breath and bilateral lower extremity edema. Current history of nonischemic cardiomyopathy and CHF. Prior MI. EXAM: PORTABLE CHEST 1 VIEW COMPARISON:  02/05/2017, 01/28/2017 and earlier. FINDINGS: Cardiac silhouette markedly enlarged, unchanged. Pulmonary venous hypertension and minimal to mild interstitial pulmonary edema, similar to the most recent examination 2 weeks ago. Small bilateral pleural effusions suspected. No confluent airspace consolidation. IMPRESSION: Minimal to mild CHF, with stable marked cardiomegaly and minimal to mild interstitial pulmonary edema. Likely small bilateral pleural effusions. Electronically Signed   By: Hulan Saas M.D.   On: 02/18/2017 12:50   Dg Chest Portable 1 View  Result Date: 02/05/2017 CLINICAL DATA:  Shortness of breath.  Lower extremity edema. EXAM: PORTABLE CHEST 1 VIEW COMPARISON:  01/28/2017 . FINDINGS: Cardiomegaly with diffuse bilateral pulmonary interstitial prominence and bilateral pleural effusions consistent with congestive heart failure.  IMPRESSION: Congestive heart failure with bilateral pulmonary interstitial edema and bilateral pleural effusions. Electronically Signed   By: Maisie Fus  Register   On: 02/05/2017 13:00     Subjective:  Feeling better today.   Discharge Exam: Vitals:   02/19/17 0502 02/19/17 0754  BP: 122/86   Pulse: 72   Resp: 18   Temp: 98.2 F (36.8 C)   SpO2: 98% 95%   Vitals:   02/18/17 1935 02/18/17 2123 02/19/17 0502 02/19/17 0754  BP:  113/74 122/86   Pulse: 84 88 72   Resp: 16 18 18    Temp:  98.1 F (36.7 C) 98.2 F (36.8 C)   TempSrc:  Oral Oral   SpO2: 94% 100% 98% 95%  Weight:   (!) 149 kg (328 lb 7.8 oz)   Height:        General: Pt is alert, awake, not in acute distress Cardiovascular: RRR, S1/S2 +, no rubs, no gallops Respiratory: CTA bilaterally, no wheezing, no rhonchi Abdominal: Soft, NT, ND, bowel sounds + Extremities: no edema, no cyanosis    The results of significant diagnostics from this hospitalization (including imaging, microbiology, ancillary and laboratory) are listed below for reference.     Recent Labs  01/18/17 0835 02/05/17 1240 02/18/17 1145  BNP 634.0* 808.0* 720.0*   Basic Metabolic Panel:  Recent Labs Lab 02/18/17 1145 02/19/17 0452  NA 139 142  K 3.6 3.4*  CL 106 104  CO2 24 31  GLUCOSE 102* 90  BUN 16 16  CREATININE 1.07 1.13  CALCIUM 8.8* 8.5*   Liver Function Tests: CBC:  Recent Labs Lab 02/18/17 1145 02/19/17 0452  WBC 4.2 3.5*  HGB 10.9* 11.1*  HCT 34.8* 35.8*  MCV 81.7 82.5  PLT 205 194   Cardiac Enzymes:  Recent Labs Lab 02/18/17 1145  TROPONINI 0.03*   Urinalysis    Component Value Date/Time   COLORURINE AMBER (A) 10/24/2016 0923   APPEARANCEUR CLEAR 10/24/2016 0923   LABSPEC 1.027 10/24/2016 0923   PHURINE 5.0 10/24/2016  0923   GLUCOSEU NEGATIVE 10/24/2016 0923   GLUCOSEU NEG mg/dL 16/03/9603 5409   HGBUR NEGATIVE 10/24/2016 0923   BILIRUBINUR NEGATIVE 10/24/2016 0923   KETONESUR NEGATIVE 10/24/2016  0923   PROTEINUR 100 (A) 10/24/2016 0923   UROBILINOGEN 0.2 08/08/2014 1445   NITRITE NEGATIVE 10/24/2016 0923   LEUKOCYTESUR NEGATIVE 10/24/2016 0923   Time coordinating discharge: Over 30 minutes SIGNED:  Houston Siren, MD FACP [facility] 02/19/2017, 9:49 AM   If 7PM-7AM, please contact night-coverage [email or web address] Password [email or web address]

## 2017-02-19 NOTE — Care Management Obs Status (Signed)
MEDICARE OBSERVATION STATUS NOTIFICATION   Patient Details  Name: [name] MRN: [id number] Date of Birth: [birth year]   Medicare Observation Status Notification Given:  Yes    [name], RN 02/19/2017, 10:30 AM

## 2017-03-01 ENCOUNTER — Emergency Department (HOSPITAL_COMMUNITY)
Admission: EM | Admit: 2017-03-01 | Discharge: 2017-03-01 | Disposition: A | Discharge status: Home / Self Care | Payer: Medicare Other | Attending: Emergency Medicine | Admitting: Emergency Medicine

## 2017-03-01 ENCOUNTER — Emergency Department (HOSPITAL_COMMUNITY): Payer: Medicare Other

## 2017-03-01 ENCOUNTER — Encounter (HOSPITAL_COMMUNITY): Payer: Self-pay

## 2017-03-01 DIAGNOSIS — J811 Chronic pulmonary edema: Secondary | ICD-10-CM | POA: Diagnosis not present

## 2017-03-01 DIAGNOSIS — Z7189 Other specified counseling: Secondary | ICD-10-CM | POA: Insufficient documentation

## 2017-03-01 DIAGNOSIS — Z515 Encounter for palliative care: Secondary | ICD-10-CM | POA: Insufficient documentation

## 2017-03-01 DIAGNOSIS — I509 Heart failure, unspecified: Secondary | ICD-10-CM | POA: Diagnosis not present

## 2017-03-01 DIAGNOSIS — Z9114 Patient's other noncompliance with medication regimen: Secondary | ICD-10-CM | POA: Diagnosis not present

## 2017-03-01 DIAGNOSIS — J45909 Unspecified asthma, uncomplicated: Secondary | ICD-10-CM | POA: Diagnosis not present

## 2017-03-01 DIAGNOSIS — Z87891 Personal history of nicotine dependence: Secondary | ICD-10-CM | POA: Diagnosis not present

## 2017-03-01 DIAGNOSIS — I110 Hypertensive heart disease with heart failure: Secondary | ICD-10-CM | POA: Diagnosis not present

## 2017-03-01 DIAGNOSIS — I5022 Chronic systolic (congestive) heart failure: Secondary | ICD-10-CM | POA: Insufficient documentation

## 2017-03-01 DIAGNOSIS — Z91199 Patient's noncompliance with other medical treatment and regimen due to unspecified reason: Secondary | ICD-10-CM

## 2017-03-01 DIAGNOSIS — Z66 Do not resuscitate: Secondary | ICD-10-CM | POA: Insufficient documentation

## 2017-03-01 DIAGNOSIS — R2242 Localized swelling, mass and lump, left lower limb: Secondary | ICD-10-CM | POA: Insufficient documentation

## 2017-03-01 DIAGNOSIS — I129 Hypertensive chronic kidney disease with stage 1 through stage 4 chronic kidney disease, or unspecified chronic kidney disease: Secondary | ICD-10-CM | POA: Insufficient documentation

## 2017-03-01 DIAGNOSIS — Z79899 Other long term (current) drug therapy: Secondary | ICD-10-CM | POA: Insufficient documentation

## 2017-03-01 DIAGNOSIS — Z7982 Long term (current) use of aspirin: Secondary | ICD-10-CM | POA: Insufficient documentation

## 2017-03-01 DIAGNOSIS — M79605 Pain in left leg: Secondary | ICD-10-CM | POA: Diagnosis not present

## 2017-03-01 DIAGNOSIS — R609 Edema, unspecified: Secondary | ICD-10-CM | POA: Diagnosis not present

## 2017-03-01 DIAGNOSIS — R2241 Localized swelling, mass and lump, right lower limb: Secondary | ICD-10-CM | POA: Diagnosis present

## 2017-03-01 DIAGNOSIS — R0682 Tachypnea, not elsewhere classified: Secondary | ICD-10-CM | POA: Diagnosis not present

## 2017-03-01 DIAGNOSIS — Z9119 Patient's noncompliance with other medical treatment and regimen: Secondary | ICD-10-CM | POA: Diagnosis not present

## 2017-03-01 DIAGNOSIS — N182 Chronic kidney disease, stage 2 (mild): Secondary | ICD-10-CM | POA: Insufficient documentation

## 2017-03-01 DIAGNOSIS — I252 Old myocardial infarction: Secondary | ICD-10-CM | POA: Diagnosis not present

## 2017-03-01 HISTORY — DX: Dependence on supplemental oxygen: Z99.81

## 2017-03-01 LAB — BRAIN NATRIURETIC PEPTIDE: B Natriuretic Peptide: 617 pg/mL — ABNORMAL HIGH (ref 0.0–100.0)

## 2017-03-01 LAB — COMPREHENSIVE METABOLIC PANEL
ALBUMIN: 3.7 g/dL (ref 3.5–5.0)
ALK PHOS: 71 U/L (ref 38–126)
ALT: 18 U/L (ref 17–63)
AST: 24 U/L (ref 15–41)
Anion gap: 8 (ref 5–15)
BUN: 19 mg/dL (ref 6–20)
CALCIUM: 8.7 mg/dL — AB (ref 8.9–10.3)
CO2: 27 mmol/L (ref 22–32)
Chloride: 105 mmol/L (ref 101–111)
Creatinine, Ser: 1.02 mg/dL (ref 0.61–1.24)
GFR calc Af Amer: >60 mL/min (ref >60)
GFR calc non Af Amer: >60 mL/min (ref >60)
GLUCOSE: 98 mg/dL (ref 65–99)
POTASSIUM: 3.3 mmol/L — AB (ref 3.5–5.1)
Sodium: 140 mmol/L (ref 135–145)
Total Bilirubin: 1.5 mg/dL — ABNORMAL HIGH (ref 0.3–1.2)
Total Protein: 6.8 g/dL (ref 6.5–8.1)

## 2017-03-01 LAB — TROPONIN I: Troponin I: <0.03 ng/mL (ref <0.03)

## 2017-03-01 LAB — CBC WITH DIFFERENTIAL/PLATELET
Basophils Absolute: 0 10*3/uL (ref 0.0–0.1)
Basophils Relative: 0 %
EOS PCT: 2 %
Eosinophils Absolute: 0.1 10*3/uL (ref 0.0–0.7)
HCT: 34.2 % — ABNORMAL LOW (ref 39.0–52.0)
Hemoglobin: 10.7 g/dL — ABNORMAL LOW (ref 13.0–17.0)
LYMPHS PCT: 16 %
Lymphs Abs: 0.7 10*3/uL (ref 0.7–4.0)
MCH: 25.9 pg — ABNORMAL LOW (ref 26.0–34.0)
MCHC: 31.3 g/dL (ref 30.0–36.0)
MCV: 82.8 fL (ref 78.0–100.0)
MONO ABS: 0.4 10*3/uL (ref 0.1–1.0)
Monocytes Relative: 8 %
Neutro Abs: 3.5 10*3/uL (ref 1.7–7.7)
Neutrophils Relative %: 74 %
PLATELETS: 191 10*3/uL (ref 150–400)
RBC: 4.13 MIL/uL — ABNORMAL LOW (ref 4.22–5.81)
RDW: 17.7 % — AB (ref 11.5–15.5)
WBC: 4.7 10*3/uL (ref 4.0–10.5)

## 2017-03-01 MED ORDER — FUROSEMIDE 10 MG/ML IJ SOLN
80.0000 mg | Freq: Once | INTRAMUSCULAR | Status: AC
  Administered 2017-03-01: 80 mg via INTRAVENOUS
  Filled 2017-03-01: qty 8

## 2017-03-01 MED ORDER — CARVEDILOL 12.5 MG PO TABS
25.0000 mg | ORAL_TABLET | Freq: Once | ORAL | Status: AC
  Administered 2017-03-01: 25 mg via ORAL
  Filled 2017-03-01: qty 2

## 2017-03-01 MED ORDER — BUMETANIDE 1 MG PO TABS
2.0000 mg | ORAL_TABLET | Freq: Once | ORAL | Status: DC
  Filled 2017-03-01: qty 1

## 2017-03-01 MED ORDER — POTASSIUM CHLORIDE CRYS ER 20 MEQ PO TBCR
40.0000 meq | EXTENDED_RELEASE_TABLET | Freq: Once | ORAL | Status: AC
  Administered 2017-03-01: 40 meq via ORAL
  Filled 2017-03-01: qty 2

## 2017-03-01 MED ORDER — RIVAROXABAN 20 MG PO TABS: 20.0000 mg | ORAL_TABLET | Freq: Once | ORAL | Status: DC

## 2017-03-01 NOTE — Discharge Instructions (Signed)
Take your usual prescriptions as previously directed.  Call your regular medical doctor today to schedule a follow up appointment within the next 2 days.  Return to the Emergency Department immediately sooner if worsening.  ° °

## 2017-03-01 NOTE — Consult Note (Signed)
Consultation Note Date: 03/01/2017   Patient Name: [name]  DOB: February 11, 1966  MRN: [id number]  Age / Sex: 51 y.o., male  PCP: Pearson Grippe, MD Referring Physician: Samuel Jester, DO  Reason for Consultation: Establishing goals of care and Psychosocial/spiritual support  HPI/Patient Profile: 51 y.o. male  with past medical history of CHF with 7 hospitalizations in 6 months, history of leg swelling and fluid retention, atrial fibrillation on Xarelto, hypertension, in my 3 years ago, asthma and obstructive sleep apnea, hypertension and high cholesterol admitted on 03/01/2017 with CHF exacerbation in ED.   Clinical Assessment and Goals of Care: [name] is sitting quietly on the bed in the ED. He greets me making and keeping eye contact. He is calm and cooperative, pleasant. We talk about his chronic health history including a fib, coronary artery disease, and CHF. [name] shares that he abides his sodium and fluid restrictions at home, but every week or 2, his legs swell and he returns to the hospital. We talk in detail about salt and fluid restrictions. We talk about the stressors that can cause CHF exacerbation.  [name] has been disabled for quite some time. He has Medicare and Medicaid. We talk about what's next. I share a diagram of the chronic illness pathway, what is normal and expected. We talk about the benefits of hospice in home, nursing, aid, social worker, and chaplain coming to his home at no cost.  [name] states that he's heard good things about hospice, and is willing to speak with a representative. I share that, at this point, he may not qualify. He states understanding.   Hospice evaluation for in-home services with [facility] hosp[facility].  PPS one year ago of 70% PPS 3 months ago 60% current PPS 50%  Healthcare power of attorney NEXT OF KIN - [name] states that his mother is his  main Education officer, environmental. He has no formal paperwork. He states that his mother knows what he does and does not want, and can abide his wishes. He shares that she is aware he is "do not attempt resuscitation".   SUMMARY OF RECOMMENDATIONS   at this point, continue to treat the treatable but no extraordinary measures such as CPR or intubation. [name] is agreeable to hospice evaluation.   Code Status/Advance Care Planning:  DNR- we discussed the concepts of allow a natural death. [name] states that is his desire. [name] states that he is aware that he could pass away at any time suddenly from a heart attack, or die in his sleep. I share that, unfortunately, I feel that that is a "best case scenario". I share my worry that his health will continue to decline, he may likely experience trouble breathing because of heart failure. We talk about his preferred place of death. He states, at this point, his preferred place would be hospice home.  Symptom Management:   per ED physician, no additional needs at this time.  Palliative Prophylaxis:   None at this time.  Additional  Recommendations (Limitations, Scope, Preferences):  Continue to treat the treatable but no CPR, no intubation.  Psycho-social/Spiritual:   Desire for further Chaplaincy support:no  Additional Recommendations: Caregiving  Support/Resources and Education on Hospice  Prognosis:   < 6 months, would not be surprising based on functional decline, 7 admissions to the hospital in 6 months, 7 ED visits in 6 months, severe heart failure along with atrial fibrillation and history of heart attack  Discharge Planning: Home to be evaluated by [facility] hospice.      Primary Diagnoses: Present on Admission: **None**   I have reviewed the medical record, interviewed the patient and family, and examined the patient. The following aspects are pertinent.  Past Medical History:  Diagnosis Date  . Allergic rhinitis   .  Asthma   . Atrial fibrillation (HCC)   . CHF (congestive heart failure) (HCC)   . Essential hypertension   . Gastroesophageal reflux disease   . Heart attack (HCC)   . History of cardiac catheterization    Normal coronaries December 2016  . Noncompliance    Major problem leading to declining health and recurrent hospitalization  . Nonischemic cardiomyopathy (HCC)    LVEF 20-25%  . On home O2    3L N/C  . OSA (obstructive sleep apnea)   . Osteoarthritis   . Peptic ulcer disease    Social History   Social History  . Marital status: Divorced    Spouse name: N/A  . Number of children: 3  . Years of education: N/A   Occupational History  . disabled    Social History Main Topics  . Smoking status: Former Smoker    Packs/day: 0.50    Years: 20.00    Types: Cigarettes    Start date: 04/26/1988    Quit date: 06/23/2007  . Smokeless tobacco: Never Used     Comment: 1 ppd former smoker  . Alcohol use No     Comment: No etoh since 2009  . Drug use: No  . Sexual activity: No   Other Topics Concern  . None   Social History Narrative  . None   Family History  Problem Relation Age of Onset  . Stroke Father   . Heart attack Father   . Aneurysm Mother        Cerebral aneurysm  . Hypertension Sister   . Colon cancer Neg Hx   . Inflammatory bowel disease Neg Hx   . Liver disease Neg Hx    Scheduled Meds: . bumetanide  2 mg Oral Once   Continuous Infusions: PRN Meds:. Medications Prior to Admission:  Prior to Admission medications   Medication Sig Start Date End Date Taking? Authorizing Provider  albuterol (PROVENTIL) (2.5 MG/3ML) 0.083% nebulizer solution Take 2.5 mg by nebulization every 6 (six) hours as needed for wheezing or shortness of breath.    [provider]  aspirin 81 MG chewable tablet Chew 81 mg by mouth as needed for mild pain or moderate pain.     [provider]  atorvastatin (LIPITOR) 40 MG tablet Take 1 tablet (40 mg total) by mouth  daily at 6 PM. 06/25/16   Kathlen Mody, MD  beclomethasone (QVAR) 80 MCG/ACT inhaler Inhale 2 puffs into the lungs 2 (two) times daily.     [provider]  bumetanide (BUMEX) 2 MG tablet Take 1 tablet (2 mg total) by mouth 2 (two) times daily. 11/13/16   Catarina Hartshorn, MD  carvedilol (COREG) 25 MG tablet Take 1 tablet (  25 mg total) by mouth 2 (two) times daily with a meal. 11/13/16   Tat, Onalee Hua, MD  docusate sodium (COLACE) 100 MG capsule Take 1 capsule (100 mg total) by mouth 2 (two) times daily. Patient taking differently: Take 100 mg by mouth 2 (two) times daily as needed for mild constipation.  06/25/16   Kathlen Mody, MD  ferrous gluconate (FERGON) 324 MG tablet Take 1 tablet (324 mg total) by mouth 2 (two) times daily with a meal. Patient taking differently: Take 324 mg by mouth daily with breakfast.  06/25/16   Kathlen Mody, MD  pantoprazole (PROTONIX) 40 MG tablet Take 1 tablet (40 mg total) by mouth daily at 6 (six) AM. Patient taking differently: Take 40 mg by mouth every evening.  06/26/16   Kathlen Mody, MD  potassium chloride (K-DUR,KLOR-CON) 20 MEQ tablet Take 1 tablet (20 mEq total) by mouth daily. 02/20/17   Houston Siren, MD  rivaroxaban (XARELTO) 20 MG TABS tablet Take 1 tablet (20 mg total) by mouth daily with breakfast. Patient taking differently: Take 20 mg by mouth daily with supper.  06/26/16   Kathlen Mody, MD  tamsulosin (FLOMAX) 0.4 MG CAPS capsule Take 1 capsule (0.4 mg total) by mouth daily. 06/25/16   Kathlen Mody, MD   Allergies  Allergen Reactions  . Banana Shortness Of Breath  . Bee Venom Shortness Of Breath, Swelling and Other (See Comments)    Reaction:  Facial swelling  . Food Shortness Of Breath, Rash and Other (See Comments)    Pt states that he is allergic to strawberries.    . Lipitor [Atorvastatin] Shortness Of Breath and Other (See Comments)    Reaction:  Nose bleeds   . Aspirin Other (See Comments)    Reaction:  GI upset   . Metolazone Other (See Comments)     Pt states that he stopped taking this med due to heart attack like symptoms.   . Oatmeal Nausea And Vomiting  . Orange Juice [Orange Oil] Nausea And Vomiting    All acidic products make him nauseous and upset stomach  . Torsemide Swelling and Other (See Comments)    Reaction:  Swelling of feet/legs   . Diltiazem Palpitations  . Hydralazine Palpitations   Review of Systems  Unable to perform ROS: Other    Physical Exam  Constitutional: He is oriented to person, place, and time. No distress.  Makes and keeps eye contact, calm and cooperative, obese  HENT:  Head: Atraumatic.  Cardiovascular: Normal rate.   Irregular  Pulmonary/Chest: Effort normal. No respiratory distress.  Abdominal: Soft.  Obese abdomen  Musculoskeletal: He exhibits edema.  Right lower leg, inner aspect of ankle with swelling that was evaluated May of this year benign  Neurological: He is alert and oriented to person, place, and time.  Skin: Skin is warm and dry.  Nursing note and vitals reviewed.   Vital Signs: BP (!) 147/111 (BP Location: Right Arm)   Pulse (!) 104   Temp 98.3 F (36.8 C) (Oral)   Resp 20   Ht 6' (1.829 m)   Wt (!) 158.3 kg (349 lb)   SpO2 98%   BMI 47.33 kg/m  Pain Assessment: No/denies pain   Pain Score: 0-No pain   SpO2: SpO2: 98 % O2 Device:SpO2: 98 % O2 Flow Rate: .   IO: Intake/output summary:  Intake/Output Summary (Last 24 hours) at 03/01/17 1134 Last data filed at 03/01/17 1013  Gross per 24 hour  Intake  0 ml  Output             2025 ml  Net            -2025 ml    LBM:   Baseline Weight: Weight: (!) 152.9 kg (337 lb) Most recent weight: Weight: (!) 158.3 kg (349 lb)     Palliative Assessment/Data:   Flowsheet Rows     Most Recent Value  Intake Tab  Referral Department  -- [ED]  Unit at Time of Referral  ER  Palliative Care Primary Diagnosis  Cardiac  Date Notified  03/01/17  Palliative Care Type  New Palliative care  Reason for  referral  Clarify Goals of Care  Date of Admission  03/01/17  Date first seen by Palliative Care  03/01/17  # of days Palliative referral response time  0 Day(s)  # of days IP prior to Palliative referral  0  Clinical Assessment  Palliative Performance Scale Score  50%  Pain Max last 24 hours  Not able to report  Pain Min Last 24 hours  Not able to report  Dyspnea Max Last 24 Hours  Not able to report  Dyspnea Min Last 24 hours  Not able to report  Psychosocial & Spiritual Assessment  Palliative Care Outcomes  Patient/Family meeting held?  Yes  Who was at the meeting?  Patient only at bedside  Patient/Family wishes: Interventions discontinued/not started   Mechanical Ventilation      Time In: 0910 Time Out: 1000 Time Total: 50 minutes Greater than 50%  of this time was spent counseling and coordinating care related to the above assessment and plan.  Signed by: Katheran Awe, NP   Please contact Palliative Medicine Team phone at [phone] for questions and concerns.  For individual provider: See Loretha Stapler

## 2017-03-01 NOTE — ED Triage Notes (Signed)
Pt reports LE edema and pain (chronic) but got worse yesterday. Pt reports taking his diuretics as prescribed, but "they make legs swell worse". Pt reported SOB prior to arrival to hospital that improved some after using his inhaler. Pt reports still having some SOB, "but not as bad."

## 2017-03-01 NOTE — ED Provider Notes (Signed)
[facility]Y DEPT Provider Note   CSN: [id number] Arrival date & time: 03/01/17  2778     History   Chief Complaint Chief Complaint  Patient presents with  . Leg Swelling    HPI [name] is a 51 y.o. male.  HPI  Pt was seen at 0740. Per pt, c/o gradual onset and persistence of constant acute flair of his chronic "fluid build up" since he as discharged from the hospital 10 days ago. Pt has long hx medical and dietary non-compliance. Pt is also non-compliant with is f/u appts. The symptoms have been associated with no other complaints. The patient has a significant history of similar symptoms previously, recently being evaluated for this complaint and multiple prior evals for same.     Past Medical History:  Diagnosis Date  . Allergic rhinitis   . Asthma   . Atrial fibrillation (HCC)   . CHF (congestive heart failure) (HCC)   . Essential hypertension   . Gastroesophageal reflux disease   . Heart attack (HCC)   . History of cardiac catheterization    Normal coronaries December 2016  . Noncompliance    Major problem leading to declining health and recurrent hospitalization  . Nonischemic cardiomyopathy (HCC)    LVEF 20-25%  . On home O2    3L N/C  . OSA (obstructive sleep apnea)   . Osteoarthritis   . Peptic ulcer disease     Patient Active Problem List   Diagnosis Date Noted  . CHF, acute on chronic (HCC) 02/18/2017  . Acute systolic heart failure (HCC) 01/18/2017  . Pedal edema 12/02/2016  . Acute CHF (congestive heart failure) (HCC) 11/09/2016  . Acute on chronic systolic CHF (congestive heart failure) (HCC) 08/19/2016  . CKD (chronic kidney disease), stage II 05/17/2016  . Coronary arteries, normal Dec 2016 01/23/2016  . Chronic anticoagulation-Xarelto 01/23/2016  . NSVT (nonsustained ventricular tachycardia) (HCC) 01/23/2016  . Elevated troponin 12/21/2015  . Nonischemic cardiomyopathy (HCC)   . Morbid obesity due to excess calories (HCC)   .  Noncompliance with diet and medication regimen 12/02/2015  . OSA (obstructive sleep apnea) 09/20/2015  . Pulmonary hypertension (HCC) 09/19/2015  . Normocytic anemia 09/19/2015  . Chronic atrial fibrillation (HCC)   . Dyspnea on exertion 05/28/2015  . Acute respiratory failure with hypoxia (HCC) 04/01/2015  . Hypokalemia 04/01/2015  . Obesity hypoventilation syndrome (HCC) 08/08/2014  . GERD (gastroesophageal reflux disease) 08/08/2014  . Edema of right lower extremity 06/21/2014  . Fecal urgency 06/21/2014  . Asthma 02/11/2009  . Hyperlipidemia 07/12/2008  . OBESITY, MORBID 07/12/2008  . Anxiety state 07/12/2008  . DEPRESSION 07/12/2008  . Essential hypertension 07/12/2008  . ALLERGIC RHINITIS 07/12/2008  . Peptic ulcer 07/12/2008  . OSTEOARTHRITIS 07/12/2008    Past Surgical History:  Procedure Laterality Date  . CARDIAC CATHETERIZATION N/A 06/14/2015   Procedure: Right/Left Heart Cath and Coronary Angiography;  Surgeon: Runell Gess, MD;  Location: [facility] INVASIVE CV LAB;  Service: Cardiovascular;  Laterality: N/A;       Home Medications    Prior to Admission medications   Medication Sig Start Date End Date Taking? Authorizing Provider  albuterol (PROVENTIL) (2.5 MG/3ML) 0.083% nebulizer solution Take 2.5 mg by nebulization every 6 (six) hours as needed for wheezing or shortness of breath.    [provider]  aspirin 81 MG chewable tablet Chew 81 mg by mouth as needed for mild pain or moderate pain.     [provider]  atorvastatin (LIPITOR) 40  MG tablet Take 1 tablet (40 mg total) by mouth daily at 6 PM. 06/25/16   Kathlen Mody, MD  beclomethasone (QVAR) 80 MCG/ACT inhaler Inhale 2 puffs into the lungs 2 (two) times daily.     [provider]  bumetanide (BUMEX) 2 MG tablet Take 1 tablet (2 mg total) by mouth 2 (two) times daily. 11/13/16   Catarina Hartshorn, MD  carvedilol (COREG) 25 MG tablet Take 1 tablet (25 mg total) by mouth 2 (two) times daily  with a meal. 11/13/16   Tat, Onalee Hua, MD  docusate sodium (COLACE) 100 MG capsule Take 1 capsule (100 mg total) by mouth 2 (two) times daily. Patient taking differently: Take 100 mg by mouth 2 (two) times daily as needed for mild constipation.  06/25/16   Kathlen Mody, MD  ferrous gluconate (FERGON) 324 MG tablet Take 1 tablet (324 mg total) by mouth 2 (two) times daily with a meal. Patient taking differently: Take 324 mg by mouth daily with breakfast.  06/25/16   Kathlen Mody, MD  pantoprazole (PROTONIX) 40 MG tablet Take 1 tablet (40 mg total) by mouth daily at 6 (six) AM. Patient taking differently: Take 40 mg by mouth every evening.  06/26/16   Kathlen Mody, MD  potassium chloride (K-DUR,KLOR-CON) 20 MEQ tablet Take 1 tablet (20 mEq total) by mouth daily. 02/20/17   Houston Siren, MD  rivaroxaban (XARELTO) 20 MG TABS tablet Take 1 tablet (20 mg total) by mouth daily with breakfast. Patient taking differently: Take 20 mg by mouth daily with supper.  06/26/16   Kathlen Mody, MD  tamsulosin (FLOMAX) 0.4 MG CAPS capsule Take 1 capsule (0.4 mg total) by mouth daily. 06/25/16   Kathlen Mody, MD    Family History Family History  Problem Relation Age of Onset  . Stroke Father   . Heart attack Father   . Aneurysm Mother        Cerebral aneurysm  . Hypertension Sister   . Colon cancer Neg Hx   . Inflammatory bowel disease Neg Hx   . Liver disease Neg Hx     Social History Social History  Substance Use Topics  . Smoking status: Former Smoker    Packs/day: 0.50    Years: 20.00    Types: Cigarettes    Start date: 04/26/1988    Quit date: 06/23/2007  . Smokeless tobacco: Never Used     Comment: 1 ppd former smoker  . Alcohol use No     Comment: No etoh since 2009     Allergies   Banana; Bee venom; Food; Lipitor [atorvastatin]; Aspirin; Metolazone; Oatmeal; Orange juice [orange oil]; Torsemide; Diltiazem; and Hydralazine   Review of Systems Review of Systems ROS: Statement: All systems negative  except as marked or noted in the HPI; Constitutional: Negative for fever and chills. ; ; Eyes: Negative for eye pain, redness and discharge. ; ; ENMT: Negative for ear pain, hoarseness, nasal congestion, sinus pressure and sore throat. ; ; Cardiovascular: Negative for chest pain, palpitations, diaphoresis, +dyspnea and peripheral edema. ; ; Respiratory: Negative for cough, wheezing and stridor. ; ; Gastrointestinal: Negative for nausea, vomiting, diarrhea, abdominal pain, blood in stool, hematemesis, jaundice and rectal bleeding. . ; ; Genitourinary: Negative for dysuria, flank pain and hematuria. ; ; Musculoskeletal: Negative for back pain and neck pain. Negative for swelling and trauma.; ; Skin: Negative for pruritus, rash, abrasions, blisters, bruising and skin lesion.; ; Neuro: Negative for headache, lightheadedness and neck stiffness. Negative for weakness, altered level  of consciousness, altered mental status, extremity weakness, paresthesias, involuntary movement, seizure and syncope.       Physical Exam Updated Vital Signs BP (!) 170/98   Pulse 97   Temp 98.3 F (36.8 C) (Oral)   Resp (!) 22   Ht 6' (1.829 m)   Wt (!) 158.3 kg (349 lb)   SpO2 94%   BMI 47.33 kg/m    Patient Vitals for the past 24 hrs:  BP Temp Temp src Pulse Resp SpO2 Height Weight  03/01/17 0930 (!) 145/111 - - (!) 102 (!) 21 96 % - -  03/01/17 0801 - - - - - - - (!) 158.3 kg (349 lb)  03/01/17 0800 (!) 170/98 - - 97 - - - -  03/01/17 0743 - - Oral - - - - -  03/01/17 0602 (!) 149/99 98.3 F (36.8 C) Oral 91 (!) 22 94 % - -  03/01/17 0559 - - - - - - 6' (1.829 m) (!) 152.9 kg (337 lb)      Physical Exam 0745: Physical examination:  Nursing notes reviewed; Vital signs and O2 SAT reviewed;  Constitutional: Well developed, Well nourished, Well hydrated, In no acute distress; Head:  Normocephalic, atraumatic; Eyes: EOMI, PERRL, No scleral icterus; ENMT: Mouth and pharynx normal, Mucous membranes moist; Neck:  Supple, Full range of motion, No lymphadenopathy; Cardiovascular: Regular rate and rhythm, No gallop; Respiratory: Breath sounds coarse & equal bilaterally, No wheezes.  Speaking full sentences with ease, Normal respiratory effort/excursion; Chest: Nontender, Movement normal; Abdomen: Soft, Nontender, Nondistended, Normal bowel sounds; Genitourinary: No CVA tenderness; Extremities: Pulses normal, No tenderness, +3 pedal edema bilat with chronic skin changes..; Neuro: AA&Ox3, Major CN grossly intact.  Speech clear. No gross focal motor or sensory deficits in extremities.; Skin: Color normal, Warm, Dry.   ED Treatments / Results  Labs (all labs ordered are listed, but only abnormal results are displayed)   EKG  EKG Interpretation  Date/Time:  Monday March 01 2017 05:58:31 EDT Ventricular Rate:  105 PR Interval:    QRS Duration: 88 QT Interval:  344 QTC Calculation: 455 R Axis:   67 Text Interpretation:  Atrial fibrillation Ventricular premature complex Nonspecific repol abnormality, lateral leads No significant change since last tracing 18 Feb 2017 Confirmed by Devoria Albe ([id number]) on 03/01/2017 6:24:13 AM       Radiology   Procedures Procedures (including critical care time)  Medications Ordered in ED Medications  bumetanide (BUMEX) tablet 2 mg (not administered)  furosemide (LASIX) injection 80 mg (80 mg Intravenous Given 03/01/17 0801)  carvedilol (COREG) tablet 25 mg (25 mg Oral Given 03/01/17 0800)     Initial Impression / Assessment and Plan / ED Course  I have reviewed the triage vital signs and the nursing notes.  Pertinent labs & imaging results that were available during my care of the patient were reviewed by me and considered in my medical decision making (see chart for details).  MDM Reviewed: previous chart, nursing note and vitals Reviewed previous: labs and ECG Interpretation: labs, ECG and x-ray   Results for orders placed or performed during the hospital  encounter of 03/01/17  Comprehensive metabolic panel  Result Value Ref Range   Sodium 140 135 - 145 mmol/L   Potassium 3.3 (L) 3.5 - 5.1 mmol/L   Chloride 105 101 - 111 mmol/L   CO2 27 22 - 32 mmol/L   Glucose, Bld 98 65 - 99 mg/dL   BUN 19 6 - 20 mg/dL  Creatinine, Ser 1.02 0.61 - 1.24 mg/dL   Calcium 8.7 (L) 8.9 - 10.3 mg/dL   Total Protein 6.8 6.5 - 8.1 g/dL   Albumin 3.7 3.5 - 5.0 g/dL   AST 24 15 - 41 U/L   ALT 18 17 - 63 U/L   Alkaline Phosphatase 71 38 - 126 U/L   Total Bilirubin 1.5 (H) 0.3 - 1.2 mg/dL   GFR calc non Af Amer >60 >60 mL/min   GFR calc Af Amer >60 >60 mL/min   Anion gap 8 5 - 15  CBC with Differential  Result Value Ref Range   WBC 4.7 4.0 - 10.5 K/uL   RBC 4.13 (L) 4.22 - 5.81 MIL/uL   Hemoglobin 10.7 (L) 13.0 - 17.0 g/dL   HCT 16.1 (L) 09.6 - 04.5 %   MCV 82.8 78.0 - 100.0 fL   MCH 25.9 (L) 26.0 - 34.0 pg   MCHC 31.3 30.0 - 36.0 g/dL   RDW 40.9 (H) 81.1 - 91.4 %   Platelets 191 150 - 400 K/uL   Neutrophils Relative % 74 %   Neutro Abs 3.5 1.7 - 7.7 K/uL   Lymphocytes Relative 16 %   Lymphs Abs 0.7 0.7 - 4.0 K/uL   Monocytes Relative 8 %   Monocytes Absolute 0.4 0.1 - 1.0 K/uL   Eosinophils Relative 2 %   Eosinophils Absolute 0.1 0.0 - 0.7 K/uL   Basophils Relative 0 %   Basophils Absolute 0.0 0.0 - 0.1 K/uL  Brain natriuretic peptide  Result Value Ref Range   B Natriuretic Peptide 617.0 (H) 0.0 - 100.0 pg/mL  Troponin I  Result Value Ref Range   Troponin I <0.03 <0.03 ng/mL   Dg Chest 2 View Result Date: 03/01/2017 CLINICAL DATA:  51 year old male with a history of lower extremity edema EXAM: CHEST  2 VIEW COMPARISON:  02/18/2017 FINDINGS: Cardiomediastinal silhouette unchanged. Fullness in the central vasculature. Interlobular septal thickening. No pneumothorax. No large pleural effusion. IMPRESSION: Persisting mild pulmonary edema with no large pleural effusion. Electronically Signed   By: Gilmer Mor D.O.   On: 03/01/2017 08:28     0845:  Labs improved from baseline. Wt on 02/19/17 was 328#; pt is 349# today. Pt's usual PO meds given long with IV lasix.  T/C to Palliative Care NP Marice Potter, case discussed, including:  HPI, pertinent PM/SHx, VS/PE, dx testing, ED course and treatment:  Agreeable to come to ED for evaluation.   1040:  I&O 2216ml+  Pt ambulated with O2 Sats 100% on his usual O2 3L N/C. No clear indication for admission at this time. NP Marice Potter has evaluated pt:  She has referred pt to Hospice and has spoken with their provider to arrange outpt f/u.  Dx and testing d/w pt.  Questions answered.  Verb understanding, agreeable to d/c home with outpt f/u.     Final Clinical Impressions(s) / ED Diagnoses   Final diagnoses:  None    New Prescriptions New Prescriptions   No medications on file     Samuel Jester, DO 03/04/17 7829

## 2017-03-01 NOTE — ACP (Advance Care Planning) (Signed)
Healthcare power of attorney NEXT OF KIN - Mr. Milia states that his mother is his main Education officer, environmental. He has no formal paperwork. He states that his mother knows what he does and does not want, and can abide his wishes. He shares that she is aware he is "do not attempt resuscitation".    Code Status/Advance Care Planning:  DNR- we discussed the concepts of allow a natural death. Mr. Baade states that is his desire. Mr. Mahurin states that he is aware that he could pass away at any time suddenly from a heart attack, or die in his sleep. I share that, unfortunately, I feel that that is a "best case scenario". I share my worry that his health will continue to decline, he may likely experience trouble breathing because of heart failure. We talk about his preferred place of death. He states, at this point, his preferred place would be hospice home.

## 2017-03-02 ENCOUNTER — Encounter (HOSPITAL_COMMUNITY): Payer: Self-pay

## 2017-03-02 ENCOUNTER — Emergency Department (HOSPITAL_COMMUNITY)
Admission: EM | Admit: 2017-03-02 | Discharge: 2017-03-02 | Disposition: A | Discharge status: Home / Self Care | Payer: Medicare Other | Attending: Emergency Medicine | Admitting: Emergency Medicine

## 2017-03-02 ENCOUNTER — Emergency Department (HOSPITAL_COMMUNITY): Payer: Medicare Other

## 2017-03-02 DIAGNOSIS — I428 Other cardiomyopathies: Secondary | ICD-10-CM

## 2017-03-02 DIAGNOSIS — Z9119 Patient's noncompliance with other medical treatment and regimen: Secondary | ICD-10-CM | POA: Insufficient documentation

## 2017-03-02 DIAGNOSIS — I255 Ischemic cardiomyopathy: Secondary | ICD-10-CM | POA: Diagnosis not present

## 2017-03-02 DIAGNOSIS — Z79899 Other long term (current) drug therapy: Secondary | ICD-10-CM | POA: Diagnosis not present

## 2017-03-02 DIAGNOSIS — I110 Hypertensive heart disease with heart failure: Secondary | ICD-10-CM | POA: Diagnosis not present

## 2017-03-02 DIAGNOSIS — R0602 Shortness of breath: Secondary | ICD-10-CM | POA: Diagnosis not present

## 2017-03-02 DIAGNOSIS — Z9114 Patient's other noncompliance with medication regimen: Secondary | ICD-10-CM

## 2017-03-02 DIAGNOSIS — Z7901 Long term (current) use of anticoagulants: Secondary | ICD-10-CM | POA: Diagnosis not present

## 2017-03-02 DIAGNOSIS — Z87891 Personal history of nicotine dependence: Secondary | ICD-10-CM | POA: Diagnosis not present

## 2017-03-02 DIAGNOSIS — R404 Transient alteration of awareness: Secondary | ICD-10-CM | POA: Diagnosis not present

## 2017-03-02 DIAGNOSIS — N182 Chronic kidney disease, stage 2 (mild): Secondary | ICD-10-CM | POA: Diagnosis not present

## 2017-03-02 DIAGNOSIS — J45909 Unspecified asthma, uncomplicated: Secondary | ICD-10-CM | POA: Diagnosis not present

## 2017-03-02 DIAGNOSIS — I130 Hypertensive heart and chronic kidney disease with heart failure and stage 1 through stage 4 chronic kidney disease, or unspecified chronic kidney disease: Secondary | ICD-10-CM | POA: Insufficient documentation

## 2017-03-02 DIAGNOSIS — I10 Essential (primary) hypertension: Secondary | ICD-10-CM | POA: Diagnosis not present

## 2017-03-02 DIAGNOSIS — I5022 Chronic systolic (congestive) heart failure: Secondary | ICD-10-CM | POA: Insufficient documentation

## 2017-03-02 DIAGNOSIS — Z7982 Long term (current) use of aspirin: Secondary | ICD-10-CM | POA: Diagnosis not present

## 2017-03-02 DIAGNOSIS — R42 Dizziness and giddiness: Secondary | ICD-10-CM | POA: Diagnosis not present

## 2017-03-02 DIAGNOSIS — R609 Edema, unspecified: Secondary | ICD-10-CM

## 2017-03-02 DIAGNOSIS — R600 Localized edema: Secondary | ICD-10-CM | POA: Diagnosis not present

## 2017-03-02 DIAGNOSIS — I420 Dilated cardiomyopathy: Secondary | ICD-10-CM | POA: Diagnosis not present

## 2017-03-02 LAB — CBC WITH DIFFERENTIAL/PLATELET
Basophils Absolute: 0 10*3/uL (ref 0.0–0.1)
Basophils Relative: 0 %
EOS ABS: 0.1 10*3/uL (ref 0.0–0.7)
EOS PCT: 3 %
HCT: 34.5 % — ABNORMAL LOW (ref 39.0–52.0)
Hemoglobin: 10.7 g/dL — ABNORMAL LOW (ref 13.0–17.0)
LYMPHS ABS: 1 10*3/uL (ref 0.7–4.0)
LYMPHS PCT: 23 %
MCH: 25.7 pg — AB (ref 26.0–34.0)
MCHC: 31 g/dL (ref 30.0–36.0)
MCV: 82.7 fL (ref 78.0–100.0)
MONO ABS: 0.3 10*3/uL (ref 0.1–1.0)
MONOS PCT: 7 %
Neutro Abs: 2.9 10*3/uL (ref 1.7–7.7)
Neutrophils Relative %: 67 %
PLATELETS: 182 10*3/uL (ref 150–400)
RBC: 4.17 MIL/uL — ABNORMAL LOW (ref 4.22–5.81)
RDW: 17.8 % — AB (ref 11.5–15.5)
WBC: 4.3 10*3/uL (ref 4.0–10.5)

## 2017-03-02 LAB — COMPREHENSIVE METABOLIC PANEL
ALBUMIN: 3.8 g/dL (ref 3.5–5.0)
ALT: 18 U/L (ref 17–63)
AST: 24 U/L (ref 15–41)
Alkaline Phosphatase: 81 U/L (ref 38–126)
Anion gap: 11 (ref 5–15)
BUN: 21 mg/dL — AB (ref 6–20)
CO2: 24 mmol/L (ref 22–32)
CREATININE: 1.1 mg/dL (ref 0.61–1.24)
Calcium: 8.7 mg/dL — ABNORMAL LOW (ref 8.9–10.3)
Chloride: 106 mmol/L (ref 101–111)
GFR calc Af Amer: >60 mL/min (ref >60)
GFR calc non Af Amer: >60 mL/min (ref >60)
GLUCOSE: 99 mg/dL (ref 65–99)
POTASSIUM: 3.3 mmol/L — AB (ref 3.5–5.1)
Sodium: 141 mmol/L (ref 135–145)
Total Bilirubin: 1.4 mg/dL — ABNORMAL HIGH (ref 0.3–1.2)
Total Protein: 6.9 g/dL (ref 6.5–8.1)

## 2017-03-02 LAB — URINALYSIS, ROUTINE W REFLEX MICROSCOPIC
BILIRUBIN URINE: NEGATIVE
GLUCOSE, UA: NEGATIVE mg/dL
Hgb urine dipstick: NEGATIVE
Ketones, ur: NEGATIVE mg/dL
Leukocytes, UA: NEGATIVE
NITRITE: NEGATIVE
PH: 7 (ref 5.0–8.0)
Protein, ur: NEGATIVE mg/dL
SPECIFIC GRAVITY, URINE: 1.004 — AB (ref 1.005–1.030)

## 2017-03-02 LAB — TROPONIN I: Troponin I: <0.03 ng/mL (ref <0.03)

## 2017-03-02 LAB — BRAIN NATRIURETIC PEPTIDE: B Natriuretic Peptide: 514 pg/mL — ABNORMAL HIGH (ref 0.0–100.0)

## 2017-03-02 MED ORDER — FUROSEMIDE 10 MG/ML IJ SOLN
60.0000 mg | Freq: Once | INTRAMUSCULAR | Status: AC
  Administered 2017-03-02: 60 mg via INTRAVENOUS
  Filled 2017-03-02: qty 6

## 2017-03-02 NOTE — ED Provider Notes (Signed)
[facility]Y DEPT Provider Note   CSN: [id number] Arrival date & time: 03/02/17  1320     History   Chief Complaint Chief Complaint  Patient presents with  . Shortness of Breath  . Leg Swelling    HPI [name] is a 51 y.o. male.  Pt presents to the ED today with sob and leg swelling.  Pt was here for the same yesterday and was seen by the palliative care team.  The pt has had several recent admissions for CHF exacerbations.  Most recently from 8/30-31.  The pt has been noncompliant with his meds and with cardiology f/u.  The pt said he took his bumex prior to EMS arrival today.  He said his legs are so swollen that he can't walk.  Weight at d/c (8/31) was 328 pounds.  Weight yesterday was 349.          Past Medical History:  Diagnosis Date  . Allergic rhinitis   . Asthma   . Atrial fibrillation (HCC)   . CHF (congestive heart failure) (HCC)   . Essential hypertension   . Gastroesophageal reflux disease   . Heart attack (HCC)   . History of cardiac catheterization    Normal coronaries December 2016  . Noncompliance    Major problem leading to declining health and recurrent hospitalization  . Nonischemic cardiomyopathy (HCC)    LVEF 20-25%  . On home O2    3L N/C  . OSA (obstructive sleep apnea)   . Osteoarthritis   . Peptic ulcer disease     Patient Active Problem List   Diagnosis Date Noted  . Encounter for hospice care discussion   . Palliative care encounter   . Goals of care, counseling/discussion   . DNR (do not resuscitate)   . DNR (do not resuscitate) discussion   . Chronic congestive heart failure (HCC) 02/18/2017  . Acute systolic heart failure (HCC) 01/18/2017  . Pedal edema 12/02/2016  . Acute CHF (congestive heart failure) (HCC) 11/09/2016  . Acute on chronic systolic CHF (congestive heart failure) (HCC) 08/19/2016  . CKD (chronic kidney disease), stage II 05/17/2016  . Coronary arteries, normal Dec 2016 01/23/2016  . Chronic  anticoagulation-Xarelto 01/23/2016  . NSVT (nonsustained ventricular tachycardia) (HCC) 01/23/2016  . Elevated troponin 12/21/2015  . Nonischemic cardiomyopathy (HCC)   . Morbid obesity due to excess calories (HCC)   . Noncompliance with diet and medication regimen 12/02/2015  . OSA (obstructive sleep apnea) 09/20/2015  . Pulmonary hypertension (HCC) 09/19/2015  . Normocytic anemia 09/19/2015  . Chronic atrial fibrillation (HCC)   . Dyspnea on exertion 05/28/2015  . Acute respiratory failure with hypoxia (HCC) 04/01/2015  . Hypokalemia 04/01/2015  . Obesity hypoventilation syndrome (HCC) 08/08/2014  . GERD (gastroesophageal reflux disease) 08/08/2014  . Edema of right lower extremity 06/21/2014  . Fecal urgency 06/21/2014  . Asthma 02/11/2009  . Hyperlipidemia 07/12/2008  . OBESITY, MORBID 07/12/2008  . Anxiety state 07/12/2008  . DEPRESSION 07/12/2008  . Essential hypertension 07/12/2008  . ALLERGIC RHINITIS 07/12/2008  . Peptic ulcer 07/12/2008  . OSTEOARTHRITIS 07/12/2008    Past Surgical History:  Procedure Laterality Date  . CARDIAC CATHETERIZATION N/A 06/14/2015   Procedure: Right/Left Heart Cath and Coronary Angiography;  Surgeon: Runell Gess, MD;  Location: [facility] INVASIVE CV LAB;  Service: Cardiovascular;  Laterality: N/A;       Home Medications    Prior to Admission medications   Medication Sig Start Date End Date Taking? Authorizing Provider  albuterol (  PROVENTIL) (2.5 MG/3ML) 0.083% nebulizer solution Take 2.5 mg by nebulization every 6 (six) hours as needed for wheezing or shortness of breath.   Yes [provider]  aspirin 81 MG chewable tablet Chew 81 mg by mouth as needed for mild pain or moderate pain.    Yes [provider]  atorvastatin (LIPITOR) 40 MG tablet Take 1 tablet (40 mg total) by mouth daily at 6 PM. 06/25/16  Yes Kathlen Mody, MD  beclomethasone (QVAR) 80 MCG/ACT inhaler Inhale 2 puffs into the lungs 2 (two) times daily.     Yes [provider]  bumetanide (BUMEX) 2 MG tablet Take 1 tablet (2 mg total) by mouth 2 (two) times daily. 11/13/16  Yes Tat, Onalee Hua, MD  carvedilol (COREG) 25 MG tablet Take 1 tablet (25 mg total) by mouth 2 (two) times daily with a meal. 11/13/16  Yes Tat, Onalee Hua, MD  docusate sodium (COLACE) 100 MG capsule Take 1 capsule (100 mg total) by mouth 2 (two) times daily. Patient taking differently: Take 100 mg by mouth 2 (two) times daily as needed for mild constipation.  06/25/16  Yes Kathlen Mody, MD  ferrous gluconate (FERGON) 324 MG tablet Take 1 tablet (324 mg total) by mouth 2 (two) times daily with a meal. Patient taking differently: Take 324 mg by mouth daily with breakfast.  06/25/16  Yes Kathlen Mody, MD  pantoprazole (PROTONIX) 40 MG tablet Take 1 tablet (40 mg total) by mouth daily at 6 (six) AM. Patient taking differently: Take 40 mg by mouth every evening.  06/26/16  Yes Kathlen Mody, MD  potassium chloride (K-DUR,KLOR-CON) 20 MEQ tablet Take 1 tablet (20 mEq total) by mouth daily. 02/20/17  Yes Houston Siren, MD  rivaroxaban (XARELTO) 20 MG TABS tablet Take 1 tablet (20 mg total) by mouth daily with breakfast. Patient taking differently: Take 20 mg by mouth daily with supper.  06/26/16  Yes Kathlen Mody, MD  tamsulosin (FLOMAX) 0.4 MG CAPS capsule Take 1 capsule (0.4 mg total) by mouth daily. 06/25/16  Yes Kathlen Mody, MD    Family History Family History  Problem Relation Age of Onset  . Stroke Father   . Heart attack Father   . Aneurysm Mother        Cerebral aneurysm  . Hypertension Sister   . Colon cancer Neg Hx   . Inflammatory bowel disease Neg Hx   . Liver disease Neg Hx     Social History Social History  Substance Use Topics  . Smoking status: Former Smoker    Packs/day: 0.50    Years: 20.00    Types: Cigarettes    Start date: 04/26/1988    Quit date: 06/23/2007  . Smokeless tobacco: Never Used     Comment: 1 ppd former smoker  . Alcohol use No     Comment: No  etoh since 2009     Allergies   Banana; Bee venom; Food; Lipitor [atorvastatin]; Aspirin; Metolazone; Oatmeal; Orange juice [orange oil]; Torsemide; Diltiazem; and Hydralazine   Review of Systems Review of Systems  Respiratory: Positive for shortness of breath.   Cardiovascular: Positive for leg swelling.  All other systems reviewed and are negative.    Physical Exam Updated Vital Signs BP (!) 155/116 (BP Location: Left Arm)   Pulse 97   Temp 99 F (37.2 C) (Oral)   Resp 19   Ht 6' (1.829 m)   Wt (!) 159.3 kg (351 lb 4 oz)   SpO2 95%   BMI 47.64  kg/m   Physical Exam  Constitutional: He is oriented to person, place, and time. He appears well-developed and well-nourished.  HENT:  Head: Normocephalic and atraumatic.  Right Ear: External ear normal.  Left Ear: External ear normal.  Nose: Nose normal.  Mouth/Throat: Oropharynx is clear and moist.  Eyes: Pupils are equal, round, and reactive to light. Conjunctivae and EOM are normal.  Neck: Normal range of motion. Neck supple.  Cardiovascular: Normal rate and normal heart sounds.  An irregularly irregular rhythm present.  Pulmonary/Chest: He has rales.  Abdominal: Soft. Bowel sounds are normal.  Musculoskeletal: Normal range of motion. He exhibits edema.  Neurological: He is alert and oriented to person, place, and time.  Skin: Skin is warm. Capillary refill takes less than 2 seconds.  Psychiatric: He has a normal mood and affect. His behavior is normal. Judgment and thought content normal.  Nursing note and vitals reviewed.    ED Treatments / Results  Labs (all labs ordered are listed, but only abnormal results are displayed) Labs Reviewed  COMPREHENSIVE METABOLIC PANEL - Abnormal; Notable for the following:       Result Value   Potassium 3.3 (*)    BUN 21 (*)    Calcium 8.7 (*)    Total Bilirubin 1.4 (*)    All other components within normal limits  CBC WITH DIFFERENTIAL/PLATELET - Abnormal; Notable for the  following:    RBC 4.17 (*)    Hemoglobin 10.7 (*)    HCT 34.5 (*)    MCH 25.7 (*)    RDW 17.8 (*)    All other components within normal limits  BRAIN NATRIURETIC PEPTIDE - Abnormal; Notable for the following:    B Natriuretic Peptide 514.0 (*)    All other components within normal limits  URINALYSIS, ROUTINE W REFLEX MICROSCOPIC - Abnormal; Notable for the following:    Color, Urine COLORLESS (*)    Specific Gravity, Urine 1.004 (*)    All other components within normal limits  TROPONIN I    EKG  EKG Interpretation  Date/Time:  Tuesday March 02 2017 13:39:12 EDT Ventricular Rate:  91 PR Interval:    QRS Duration: 97 QT Interval:  402 QTC Calculation: 495 R Axis:   56 Text Interpretation:  Atrial fibrillation Ventricular premature complex Borderline T abnormalities, diffuse leads Borderline prolonged QT interval No significant change since last tracing Confirmed by Jacalyn Lefevre [phone]) on 03/02/2017 3:44:38 PM       Radiology Dg Chest 2 View  Result Date: 03/02/2017 CLINICAL DATA:  Shortness of breath and edema. History of CHF, COPD, and diabetes. Former smoker. EXAM: CHEST  2 VIEW COMPARISON:  PA and lateral chest x-ray of March 01, 2017 FINDINGS: The lungs are adequately inflated. The interstitial markings are increased. The pulmonary vascularity is engorged. The cardiac silhouette is enlarged. There is no pleural effusion. There is calcification in the wall of the aortic arch. IMPRESSION: CHF with mild pulmonary interstitial edema slightly less conspicuous than on yesterday's study. Thoracic aortic atherosclerosis. Electronically Signed   By: David  [place] M.D.   On: 03/02/2017 14:37   Dg Chest 2 View  Result Date: 03/01/2017 CLINICAL DATA:  51 year old male with a history of lower extremity edema EXAM: CHEST  2 VIEW COMPARISON:  02/18/2017 FINDINGS: Cardiomediastinal silhouette unchanged. Fullness in the central vasculature. Interlobular septal thickening. No  pneumothorax. No large pleural effusion. IMPRESSION: Persisting mild pulmonary edema with no large pleural effusion. Electronically Signed   By: Gilmer Mor D.O.  On: 03/01/2017 08:28    Procedures Procedures (including critical care time)  Medications Ordered in ED Medications  furosemide (LASIX) injection 60 mg (60 mg Intravenous Given 03/02/17 1351)     Initial Impression / Assessment and Plan / ED Course  I have reviewed the triage vital signs and the nursing notes.  Pertinent labs & imaging results that were available during my care of the patient were reviewed by me and considered in my medical decision making (see chart for details).   Due to the multiple medical problems, pt will be referred to hospice.  Pt was seen in the ED today by hospice.  They were on their way to evaluate him when he called EMS.  Pt is a candidate for hospice and will go home with hospice care.  He diuresed well here with 60 mg of lasix here.  Final Clinical Impressions(s) / ED Diagnoses   Final diagnoses:  Peripheral edema  Chronic systolic congestive heart failure (HCC)  Nonischemic cardiomyopathy (HCC)  History of medication noncompliance    New Prescriptions New Prescriptions   No medications on file     Jacalyn Lefevre, MD 03/02/17 1550

## 2017-03-02 NOTE — ED Triage Notes (Signed)
Pt c/o sob and leg swelling that got worse after being d/c'd from er yesterday.  Denies pain.

## 2017-03-03 DIAGNOSIS — D649 Anemia, unspecified: Secondary | ICD-10-CM | POA: Diagnosis not present

## 2017-03-03 DIAGNOSIS — G4730 Sleep apnea, unspecified: Secondary | ICD-10-CM | POA: Diagnosis not present

## 2017-03-03 DIAGNOSIS — I272 Pulmonary hypertension, unspecified: Secondary | ICD-10-CM | POA: Diagnosis not present

## 2017-03-03 DIAGNOSIS — I110 Hypertensive heart disease with heart failure: Secondary | ICD-10-CM | POA: Diagnosis not present

## 2017-03-03 DIAGNOSIS — E785 Hyperlipidemia, unspecified: Secondary | ICD-10-CM | POA: Diagnosis not present

## 2017-03-04 ENCOUNTER — Emergency Department (HOSPITAL_COMMUNITY)
Admission: EM | Admit: 2017-03-04 | Discharge: 2017-03-04 | Disposition: A | Discharge status: Home / Self Care | Payer: Medicare Other | Attending: Emergency Medicine | Admitting: Emergency Medicine

## 2017-03-04 ENCOUNTER — Encounter (HOSPITAL_COMMUNITY): Payer: Self-pay | Admitting: Emergency Medicine

## 2017-03-04 DIAGNOSIS — R062 Wheezing: Secondary | ICD-10-CM | POA: Diagnosis not present

## 2017-03-04 DIAGNOSIS — Z7901 Long term (current) use of anticoagulants: Secondary | ICD-10-CM | POA: Diagnosis not present

## 2017-03-04 DIAGNOSIS — J45909 Unspecified asthma, uncomplicated: Secondary | ICD-10-CM | POA: Diagnosis not present

## 2017-03-04 DIAGNOSIS — R0600 Dyspnea, unspecified: Secondary | ICD-10-CM | POA: Insufficient documentation

## 2017-03-04 DIAGNOSIS — R0682 Tachypnea, not elsewhere classified: Secondary | ICD-10-CM | POA: Diagnosis not present

## 2017-03-04 DIAGNOSIS — I130 Hypertensive heart and chronic kidney disease with heart failure and stage 1 through stage 4 chronic kidney disease, or unspecified chronic kidney disease: Secondary | ICD-10-CM | POA: Insufficient documentation

## 2017-03-04 DIAGNOSIS — Z79899 Other long term (current) drug therapy: Secondary | ICD-10-CM | POA: Diagnosis not present

## 2017-03-04 DIAGNOSIS — N182 Chronic kidney disease, stage 2 (mild): Secondary | ICD-10-CM | POA: Diagnosis not present

## 2017-03-04 DIAGNOSIS — I5022 Chronic systolic (congestive) heart failure: Secondary | ICD-10-CM | POA: Diagnosis not present

## 2017-03-04 DIAGNOSIS — R0602 Shortness of breath: Secondary | ICD-10-CM | POA: Diagnosis not present

## 2017-03-04 MED ORDER — FUROSEMIDE 10 MG/ML IJ SOLN
80.0000 mg | Freq: Once | INTRAMUSCULAR | Status: AC
  Administered 2017-03-04: 80 mg via INTRAVENOUS

## 2017-03-04 MED ORDER — FUROSEMIDE 10 MG/ML IJ SOLN
80.0000 mg | Freq: Once | INTRAMUSCULAR | Status: DC
  Filled 2017-03-04: qty 8

## 2017-03-04 NOTE — ED Notes (Signed)
Pt is now able to lie back in bed and breathe without much discomfort.

## 2017-03-04 NOTE — ED Triage Notes (Signed)
Pt returns for continued shortness of breath and swelling for the past few days.

## 2017-03-04 NOTE — Discharge Instructions (Signed)
Return tomorrow for recheck. IV has been kept in place.  Remind the nurse tomorrow that you have an IV

## 2017-03-04 NOTE — ED Notes (Addendum)
Per Dr. Adriana Simas, pt needs to return tomorrow for recheck. Dr. Adriana Simas authorized pt to keep IV overnight due to him being a hard stick. I will wrap the patients IV and arm up in gauze so he is able to be seen tomorrow. Charge Nurse Harlow Mares witnessed

## 2017-03-04 NOTE — Care Management (Signed)
CM received notification from [facility] rep [name]) that pt was unaccepting of hospice. "He says he wants to keep coming to hospital and get his IV lasix."

## 2017-03-04 NOTE — ED Triage Notes (Signed)
Given albuterol x 1 by ems.

## 2017-03-04 NOTE — ED Notes (Signed)
Pt.'s IV site is now wrapped up in gauze.

## 2017-03-05 ENCOUNTER — Encounter (HOSPITAL_COMMUNITY): Payer: Self-pay | Admitting: Emergency Medicine

## 2017-03-05 ENCOUNTER — Emergency Department (HOSPITAL_COMMUNITY)
Admission: EM | Admit: 2017-03-05 | Discharge: 2017-03-05 | Disposition: A | Discharge status: Home / Self Care | Payer: Medicare Other | Attending: Emergency Medicine | Admitting: Emergency Medicine

## 2017-03-05 DIAGNOSIS — Z79899 Other long term (current) drug therapy: Secondary | ICD-10-CM | POA: Insufficient documentation

## 2017-03-05 DIAGNOSIS — J45909 Unspecified asthma, uncomplicated: Secondary | ICD-10-CM | POA: Insufficient documentation

## 2017-03-05 DIAGNOSIS — Z7982 Long term (current) use of aspirin: Secondary | ICD-10-CM | POA: Diagnosis not present

## 2017-03-05 DIAGNOSIS — R030 Elevated blood-pressure reading, without diagnosis of hypertension: Secondary | ICD-10-CM | POA: Diagnosis not present

## 2017-03-05 DIAGNOSIS — M79661 Pain in right lower leg: Secondary | ICD-10-CM | POA: Diagnosis present

## 2017-03-05 DIAGNOSIS — Z87891 Personal history of nicotine dependence: Secondary | ICD-10-CM | POA: Diagnosis not present

## 2017-03-05 DIAGNOSIS — R600 Localized edema: Secondary | ICD-10-CM | POA: Diagnosis not present

## 2017-03-05 DIAGNOSIS — R609 Edema, unspecified: Secondary | ICD-10-CM | POA: Insufficient documentation

## 2017-03-05 DIAGNOSIS — Z7901 Long term (current) use of anticoagulants: Secondary | ICD-10-CM | POA: Insufficient documentation

## 2017-03-05 DIAGNOSIS — I5022 Chronic systolic (congestive) heart failure: Secondary | ICD-10-CM | POA: Diagnosis not present

## 2017-03-05 DIAGNOSIS — I130 Hypertensive heart and chronic kidney disease with heart failure and stage 1 through stage 4 chronic kidney disease, or unspecified chronic kidney disease: Secondary | ICD-10-CM | POA: Insufficient documentation

## 2017-03-05 DIAGNOSIS — N182 Chronic kidney disease, stage 2 (mild): Secondary | ICD-10-CM | POA: Diagnosis not present

## 2017-03-05 LAB — CBC WITH DIFFERENTIAL/PLATELET
Basophils Absolute: 0 10*3/uL (ref 0.0–0.1)
Basophils Relative: 0 %
EOS ABS: 0.1 10*3/uL (ref 0.0–0.7)
Eosinophils Relative: 3 %
HCT: 36.6 % — ABNORMAL LOW (ref 39.0–52.0)
HEMOGLOBIN: 11.5 g/dL — AB (ref 13.0–17.0)
LYMPHS ABS: 0.9 10*3/uL (ref 0.7–4.0)
LYMPHS PCT: 23 %
MCH: 25.8 pg — AB (ref 26.0–34.0)
MCHC: 31.4 g/dL (ref 30.0–36.0)
MCV: 82.1 fL (ref 78.0–100.0)
MONOS PCT: 7 %
Monocytes Absolute: 0.3 10*3/uL (ref 0.1–1.0)
NEUTROS PCT: 67 %
Neutro Abs: 2.7 10*3/uL (ref 1.7–7.7)
Platelets: 207 10*3/uL (ref 150–400)
RBC: 4.46 MIL/uL (ref 4.22–5.81)
RDW: 17.6 % — ABNORMAL HIGH (ref 11.5–15.5)
WBC: 4 10*3/uL (ref 4.0–10.5)

## 2017-03-05 LAB — BASIC METABOLIC PANEL
Anion gap: 9 (ref 5–15)
BUN: 15 mg/dL (ref 6–20)
CHLORIDE: 103 mmol/L (ref 101–111)
CO2: 28 mmol/L (ref 22–32)
CREATININE: 1.06 mg/dL (ref 0.61–1.24)
Calcium: 8.8 mg/dL — ABNORMAL LOW (ref 8.9–10.3)
GFR calc Af Amer: >60 mL/min (ref >60)
GFR calc non Af Amer: >60 mL/min (ref >60)
GLUCOSE: 99 mg/dL (ref 65–99)
POTASSIUM: 3.2 mmol/L — AB (ref 3.5–5.1)
SODIUM: 140 mmol/L (ref 135–145)

## 2017-03-05 MED ORDER — FUROSEMIDE 10 MG/ML IJ SOLN
80.0000 mg | Freq: Once | INTRAMUSCULAR | Status: AC
  Administered 2017-03-05: 80 mg via INTRAVENOUS
  Filled 2017-03-05: qty 8

## 2017-03-05 MED ORDER — POTASSIUM CHLORIDE CRYS ER 20 MEQ PO TBCR
40.0000 meq | EXTENDED_RELEASE_TABLET | Freq: Once | ORAL | Status: AC
  Administered 2017-03-05: 40 meq via ORAL
  Filled 2017-03-05: qty 2

## 2017-03-05 NOTE — Discharge Instructions (Signed)
Take your home medications.  See Dr. Selena Batten for recheck next week

## 2017-03-05 NOTE — ED Notes (Addendum)
Pt is conversing and more alert than he was yesterday. States he feels better and his legs have gone down some. Pt feels like he needs to be admitted to continue to get fluid off his legs.

## 2017-03-05 NOTE — ED Notes (Signed)
IV intact to left ac area and flushes. No redness noted at site

## 2017-03-05 NOTE — ED Triage Notes (Signed)
Pt returns for another eval of leg swelling.  Denies sob.  States took Bumex this morning.

## 2017-03-05 NOTE — ED Provider Notes (Signed)
[facility] DE[facility] Provider Note   CSN: [id number] Arrival date & time: 03/05/17  1478     History   Chief Complaint Chief Complaint  Patient presents with  . Leg Swelling    HPI [name] is a 51 y.o. male.  The history is provided by the patient. No language interpreter was used.  Leg Pain   This is a new problem. The problem occurs constantly. The problem has been gradually worsening. The pain is present in the right lower leg and left lower leg. The quality of the pain is described as aching. The pain is moderate. Pertinent negatives include no numbness. He has tried nothing for the symptoms. The treatment provided no relief. There has been no history of extremity trauma.  Pt reports he is here for another dose of lasix.  Pt reports he was here yesterday.  RN left Iv in so that pt could get a dose today.   Past Medical History:  Diagnosis Date  . Allergic rhinitis   . Asthma   . Atrial fibrillation (HCC)   . CHF (congestive heart failure) (HCC)   . Essential hypertension   . Gastroesophageal reflux disease   . Heart attack (HCC)   . History of cardiac catheterization    Normal coronaries December 2016  . Noncompliance    Major problem leading to declining health and recurrent hospitalization  . Nonischemic cardiomyopathy (HCC)    LVEF 20-25%  . On home O2    3L N/C  . OSA (obstructive sleep apnea)   . Osteoarthritis   . Peptic ulcer disease     Patient Active Problem List   Diagnosis Date Noted  . Encounter for hospice care discussion   . Palliative care encounter   . Goals of care, counseling/discussion   . DNR (do not resuscitate)   . DNR (do not resuscitate) discussion   . Chronic congestive heart failure (HCC) 02/18/2017  . Acute systolic heart failure (HCC) 01/18/2017  . Pedal edema 12/02/2016  . Acute CHF (congestive heart failure) (HCC) 11/09/2016  . Acute on chronic systolic CHF (congestive heart failure) (HCC) 08/19/2016  . CKD (chronic  kidney disease), stage II 05/17/2016  . Coronary arteries, normal Dec 2016 01/23/2016  . Chronic anticoagulation-Xarelto 01/23/2016  . NSVT (nonsustained ventricular tachycardia) (HCC) 01/23/2016  . Elevated troponin 12/21/2015  . Nonischemic cardiomyopathy (HCC)   . Morbid obesity due to excess calories (HCC)   . Noncompliance with diet and medication regimen 12/02/2015  . OSA (obstructive sleep apnea) 09/20/2015  . Pulmonary hypertension (HCC) 09/19/2015  . Normocytic anemia 09/19/2015  . Chronic atrial fibrillation (HCC)   . Dyspnea on exertion 05/28/2015  . Acute respiratory failure with hypoxia (HCC) 04/01/2015  . Hypokalemia 04/01/2015  . Obesity hypoventilation syndrome (HCC) 08/08/2014  . GERD (gastroesophageal reflux disease) 08/08/2014  . Edema of right lower extremity 06/21/2014  . Fecal urgency 06/21/2014  . Asthma 02/11/2009  . Hyperlipidemia 07/12/2008  . OBESITY, MORBID 07/12/2008  . Anxiety state 07/12/2008  . DEPRESSION 07/12/2008  . Essential hypertension 07/12/2008  . ALLERGIC RHINITIS 07/12/2008  . Peptic ulcer 07/12/2008  . OSTEOARTHRITIS 07/12/2008    Past Surgical History:  Procedure Laterality Date  . CARDIAC CATHETERIZATION N/A 06/14/2015   Procedure: Right/Left Heart Cath and Coronary Angiography;  Surgeon: Runell Gess, MD;  Location: [facility] INVASIVE CV LAB;  Service: Cardiovascular;  Laterality: N/A;       Home Medications    Prior to Admission medications   Medication Sig Start  Date End Date Taking? Authorizing Provider  albuterol (PROVENTIL) (2.5 MG/3ML) 0.083% nebulizer solution Take 2.5 mg by nebulization every 6 (six) hours as needed for wheezing or shortness of breath.   Yes [provider]  aspirin 81 MG chewable tablet Chew 81 mg by mouth as needed for mild pain or moderate pain.    Yes [provider]  atorvastatin (LIPITOR) 40 MG tablet Take 1 tablet (40 mg total) by mouth daily at 6 PM. 06/25/16  Yes Kathlen Mody, MD   beclomethasone (QVAR) 80 MCG/ACT inhaler Inhale 2 puffs into the lungs 2 (two) times daily.    Yes [provider]  bumetanide (BUMEX) 2 MG tablet Take 1 tablet (2 mg total) by mouth 2 (two) times daily. 11/13/16  Yes Tat, Onalee Hua, MD  carvedilol (COREG) 25 MG tablet Take 1 tablet (25 mg total) by mouth 2 (two) times daily with a meal. 11/13/16  Yes Tat, Onalee Hua, MD  docusate sodium (COLACE) 100 MG capsule Take 1 capsule (100 mg total) by mouth 2 (two) times daily. Patient taking differently: Take 100 mg by mouth 2 (two) times daily as needed for mild constipation.  06/25/16  Yes Kathlen Mody, MD  ferrous gluconate (FERGON) 324 MG tablet Take 1 tablet (324 mg total) by mouth 2 (two) times daily with a meal. Patient taking differently: Take 324 mg by mouth daily with breakfast.  06/25/16  Yes Kathlen Mody, MD  pantoprazole (PROTONIX) 40 MG tablet Take 1 tablet (40 mg total) by mouth daily at 6 (six) AM. Patient taking differently: Take 40 mg by mouth every evening.  06/26/16  Yes Kathlen Mody, MD  potassium chloride (K-DUR,KLOR-CON) 20 MEQ tablet Take 1 tablet (20 mEq total) by mouth daily. 02/20/17  Yes Houston Siren, MD  rivaroxaban (XARELTO) 20 MG TABS tablet Take 1 tablet (20 mg total) by mouth daily with breakfast. Patient taking differently: Take 20 mg by mouth daily with supper.  06/26/16  Yes Kathlen Mody, MD  tamsulosin (FLOMAX) 0.4 MG CAPS capsule Take 1 capsule (0.4 mg total) by mouth daily. 06/25/16  Yes Kathlen Mody, MD    Family History Family History  Problem Relation Age of Onset  . Stroke Father   . Heart attack Father   . Aneurysm Mother        Cerebral aneurysm  . Hypertension Sister   . Colon cancer Neg Hx   . Inflammatory bowel disease Neg Hx   . Liver disease Neg Hx     Social History Social History  Substance Use Topics  . Smoking status: Former Smoker    Packs/day: 0.50    Years: 20.00    Types: Cigarettes    Start date: 04/26/1988    Quit date: 06/23/2007  .  Smokeless tobacco: Never Used     Comment: 1 ppd former smoker  . Alcohol use No     Comment: No etoh since 2009     Allergies   Banana; Bee venom; Food; Lipitor [atorvastatin]; Aspirin; Metolazone; Oatmeal; Orange juice [orange oil]; Torsemide; Diltiazem; and Hydralazine   Review of Systems Review of Systems  Neurological: Negative for numbness.  All other systems reviewed and are negative.    Physical Exam Updated Vital Signs BP (!) 151/109   Pulse (!) 109   Temp 98.2 F (36.8 C) (Oral)   Resp 18   Ht 6' (1.829 m)   Wt (!) 159.2 kg (351 lb)   SpO2 94%   BMI 47.60 kg/m   Physical Exam  Constitutional: He appears well-developed and well-nourished.  HENT:  Head: Normocephalic and atraumatic.  Eyes: Conjunctivae are normal.  Neck: Neck supple.  Cardiovascular: Normal rate and regular rhythm.   No murmur heard. Pulmonary/Chest: Effort normal and breath sounds normal. No respiratory distress.  Abdominal: Soft. There is no tenderness.  Musculoskeletal: He exhibits edema and tenderness.  Neurological: He is alert.  Skin: Skin is warm and dry.  Psychiatric: He has a normal mood and affect.  Nursing note and vitals reviewed.    ED Treatments / Results  Labs (all labs ordered are listed, but only abnormal results are displayed) Labs Reviewed  CBC WITH DIFFERENTIAL/PLATELET - Abnormal; Notable for the following:       Result Value   Hemoglobin 11.5 (*)    HCT 36.6 (*)    MCH 25.8 (*)    RDW 17.6 (*)    All other components within normal limits  BASIC METABOLIC PANEL - Abnormal; Notable for the following:    Potassium 3.2 (*)    Calcium 8.8 (*)    All other components within normal limits    EKG  EKG Interpretation None       Radiology No results found.  Procedures Procedures (including critical care time)  Medications Ordered in ED Medications  potassium chloride SA (K-DUR,KLOR-CON) CR tablet 40 mEq (not administered)  furosemide (LASIX)  injection 80 mg (80 mg Intravenous Given 03/05/17 1011)     Initial Impression / Assessment and Plan / ED Course  I have reviewed the triage vital signs and the nursing notes.  Pertinent labs & imaging results that were available during my care of the patient were reviewed by me and considered in my medical decision making (see chart for details).     Pt given Iv lasix.   Pt had 2100cc output of urine.   Pt advisd to continue his home medications.  Pt given of Potassium po.   Final Clinical Impressions(s) / ED Diagnoses   Final diagnoses:  Peripheral edema    New Prescriptions New Prescriptions   No medications on file  An After Visit Summary was printed and given to the patient.   Elson Areas, [place] 03/05/17 1329    Loren Racer, MD 03/05/17 1500

## 2017-03-06 NOTE — ED Provider Notes (Signed)
[facility]-EMERGENCY DEPT Provider Note   CSN: [id number] Arrival date & time: 03/04/17  1711     History   Chief Complaint Chief Complaint  Patient presents with  . Shortness of Breath    HPI [name] is a 51 y.o. male.  Patient with known chronic congestive heart failure, persistent lower extremity edema, many other problems presents with swelling in his legs and mild dyspnea. He appears to be his baseline. No chest pain. He is dyspneic with exertion which is normal. He is now on Bumex for fluid regulation. Review of records reveal a palliative care consult recently. Severity of symptoms moderate.      Past Medical History:  Diagnosis Date  . Allergic rhinitis   . Asthma   . Atrial fibrillation (HCC)   . CHF (congestive heart failure) (HCC)   . Essential hypertension   . Gastroesophageal reflux disease   . Heart attack (HCC)   . History of cardiac catheterization    Normal coronaries December 2016  . Noncompliance    Major problem leading to declining health and recurrent hospitalization  . Nonischemic cardiomyopathy (HCC)    LVEF 20-25%  . On home O2    3L N/C  . OSA (obstructive sleep apnea)   . Osteoarthritis   . Peptic ulcer disease     Patient Active Problem List   Diagnosis Date Noted  . Encounter for hospice care discussion   . Palliative care encounter   . Goals of care, counseling/discussion   . DNR (do not resuscitate)   . DNR (do not resuscitate) discussion   . Chronic congestive heart failure (HCC) 02/18/2017  . Acute systolic heart failure (HCC) 01/18/2017  . Pedal edema 12/02/2016  . Acute CHF (congestive heart failure) (HCC) 11/09/2016  . Acute on chronic systolic CHF (congestive heart failure) (HCC) 08/19/2016  . CKD (chronic kidney disease), stage II 05/17/2016  . Coronary arteries, normal Dec 2016 01/23/2016  . Chronic anticoagulation-Xarelto 01/23/2016  . NSVT (nonsustained ventricular tachycardia) (HCC) 01/23/2016  . Elevated troponin  12/21/2015  . Nonischemic cardiomyopathy (HCC)   . Morbid obesity due to excess calories (HCC)   . Noncompliance with diet and medication regimen 12/02/2015  . OSA (obstructive sleep apnea) 09/20/2015  . Pulmonary hypertension (HCC) 09/19/2015  . Normocytic anemia 09/19/2015  . Chronic atrial fibrillation (HCC)   . Dyspnea on exertion 05/28/2015  . Acute respiratory failure with hypoxia (HCC) 04/01/2015  . Hypokalemia 04/01/2015  . Obesity hypoventilation syndrome (HCC) 08/08/2014  . GERD (gastroesophageal reflux disease) 08/08/2014  . Edema of right lower extremity 06/21/2014  . Fecal urgency 06/21/2014  . Asthma 02/11/2009  . Hyperlipidemia 07/12/2008  . OBESITY, MORBID 07/12/2008  . Anxiety state 07/12/2008  . DEPRESSION 07/12/2008  . Essential hypertension 07/12/2008  . ALLERGIC RHINITIS 07/12/2008  . Peptic ulcer 07/12/2008  . OSTEOARTHRITIS 07/12/2008    Past Surgical History:  Procedure Laterality Date  . CARDIAC CATHETERIZATION N/A 06/14/2015   Procedure: Right/Left Heart Cath and Coronary Angiography;  Surgeon: Runell Gess, MD;  Location: [facility] INVASIVE CV LAB;  Service: Cardiovascular;  Laterality: N/A;       Home Medications    Prior to Admission medications   Medication Sig Start Date End Date Taking? Authorizing Provider  albuterol (PROVENTIL) (2.5 MG/3ML) 0.083% nebulizer solution Take 2.5 mg by nebulization every 6 (six) hours as needed for wheezing or shortness of breath.   Yes [provider]  aspirin 81 MG chewable tablet Chew 81 mg by mouth as  needed for mild pain or moderate pain.    Yes [provider]  atorvastatin (LIPITOR) 40 MG tablet Take 1 tablet (40 mg total) by mouth daily at 6 PM. 06/25/16  Yes Kathlen Mody, MD  beclomethasone (QVAR) 80 MCG/ACT inhaler Inhale 2 puffs into the lungs 2 (two) times daily.    Yes [provider]  bumetanide (BUMEX) 2 MG tablet Take 1 tablet (2 mg total) by mouth 2 (two) times daily.  11/13/16  Yes Tat, Onalee Hua, MD  carvedilol (COREG) 25 MG tablet Take 1 tablet (25 mg total) by mouth 2 (two) times daily with a meal. 11/13/16  Yes Tat, Onalee Hua, MD  docusate sodium (COLACE) 100 MG capsule Take 1 capsule (100 mg total) by mouth 2 (two) times daily. Patient taking differently: Take 100 mg by mouth 2 (two) times daily as needed for mild constipation.  06/25/16  Yes Kathlen Mody, MD  ferrous gluconate (FERGON) 324 MG tablet Take 1 tablet (324 mg total) by mouth 2 (two) times daily with a meal. Patient taking differently: Take 324 mg by mouth daily with breakfast.  06/25/16  Yes Kathlen Mody, MD  pantoprazole (PROTONIX) 40 MG tablet Take 1 tablet (40 mg total) by mouth daily at 6 (six) AM. Patient taking differently: Take 40 mg by mouth every evening.  06/26/16  Yes Kathlen Mody, MD  potassium chloride (K-DUR,KLOR-CON) 20 MEQ tablet Take 1 tablet (20 mEq total) by mouth daily. 02/20/17  Yes Houston Siren, MD  rivaroxaban (XARELTO) 20 MG TABS tablet Take 1 tablet (20 mg total) by mouth daily with breakfast. Patient taking differently: Take 20 mg by mouth daily with supper.  06/26/16  Yes Kathlen Mody, MD  tamsulosin (FLOMAX) 0.4 MG CAPS capsule Take 1 capsule (0.4 mg total) by mouth daily. 06/25/16  Yes Kathlen Mody, MD    Family History Family History  Problem Relation Age of Onset  . Stroke Father   . Heart attack Father   . Aneurysm Mother        Cerebral aneurysm  . Hypertension Sister   . Colon cancer Neg Hx   . Inflammatory bowel disease Neg Hx   . Liver disease Neg Hx     Social History Social History  Substance Use Topics  . Smoking status: Former Smoker    Packs/day: 0.50    Years: 20.00    Types: Cigarettes    Start date: 04/26/1988    Quit date: 06/23/2007  . Smokeless tobacco: Never Used     Comment: 1 ppd former smoker  . Alcohol use No     Comment: No etoh since 2009     Allergies   Banana; Bee venom; Food; Lipitor [atorvastatin]; Aspirin; Metolazone; Oatmeal; Orange  juice [orange oil]; Torsemide; Diltiazem; and Hydralazine   Review of Systems Review of Systems  All other systems reviewed and are negative.    Physical Exam Updated Vital Signs BP (!) 136/95   Pulse 90   Temp 98.1 F (36.7 C) (Oral)   Resp (!) 28   Ht 6' (1.829 m)   Wt (!) 159.2 kg (351 lb)   SpO2 100%   BMI 47.60 kg/m   Physical Exam  Constitutional: He is oriented to person, place, and time. He appears well-developed and well-nourished.  HENT:  Head: Normocephalic and atraumatic.  Eyes: Conjunctivae are normal.  Neck: Neck supple.  Cardiovascular: Normal rate and regular rhythm.   Pulmonary/Chest: Effort normal and breath sounds normal.  Abdominal: Soft. Bowel sounds are normal.  Musculoskeletal: Normal range of motion.  Neurological: He is alert and oriented to person, place, and time.  Skin:  4+ peripheral edema.  Psychiatric: He has a normal mood and affect. His behavior is normal.  Nursing note and vitals reviewed.    ED Treatments / Results  Labs (all labs ordered are listed, but only abnormal results are displayed) Labs Reviewed - No data to display  EKG  EKG Interpretation  Date/Time:  Thursday March 04 2017 17:15:38 EDT Ventricular Rate:  85 PR Interval:    QRS Duration: 92 QT Interval:  341 QTC Calculation: 406 R Axis:   64 Text Interpretation:  Atrial fibrillation Ventricular premature complex Borderline repolarization abnormality Confirmed by Donnetta Hutching ([id number]) on 03/04/2017 8:21:30 PM       Radiology No results found.  Procedures Procedures (including critical care time)  Medications Ordered in ED Medications  furosemide (LASIX) injection 80 mg (80 mg Intravenous Given 03/04/17 1844)     Initial Impression / Assessment and Plan / ED Course  I have reviewed the triage vital signs and the nursing notes.  Pertinent labs & imaging results that were available during my care of the patient were reviewed by me and considered in my  medical decision making (see chart for details).     Patient is hemodynamically stable. He is chronically ill. He has had a recent palliative care consult. Did not think a repeat chest x-ray today was necessary. We'll give Lasix 80 mg IV. IV will be kept in place and patient will return tomorrow for reevaluation.  Final Clinical Impressions(s) / ED Diagnoses   Final diagnoses:  Dyspnea, unspecified type    New Prescriptions Discharge Medication List as of 03/04/2017 11:01 PM       Donnetta Hutching, MD 03/06/17 1130

## 2017-03-08 ENCOUNTER — Ambulatory Visit: Payer: Medicare Other | Admitting: Adult Health

## 2017-03-08 ENCOUNTER — Encounter: Payer: Self-pay | Admitting: Adult Health

## 2017-03-08 NOTE — Progress Notes (Deleted)
Cardiology Office Note   Date:  03/08/2017   ID:  [name], DOB 1965/06/25, MRN [id number]  PCP:  Pearson Grippe, MD  Cardiologist:  Mammie Lorenzo chief complaint on file.     History of Present Illness: [name] is a 51 y.o. male who presents for post hospital follow up after multiple ER and hospital admissions for acute on chronic mixed CHF. He has a history of ER visits almost daily for IV lasix. He does not take po as he believes it makes him retain fluid. He also has a history of hypertension, NICM, PAF, along with gross medical and dietary non-compliance. He was last seen in the ER on [phone] for "IV Lasix" having been seen for several days before in ER for similar complaints. He refuses po lasix, torsemide, or metolazone. He takes Bumex occasionally. He does not take Xarelto as directed. [place] VASC Score of 3    Past Medical History:  Diagnosis Date  . Allergic rhinitis   . Asthma   . Atrial fibrillation (HCC)   . CHF (congestive heart failure) (HCC)   . Essential hypertension   . Gastroesophageal reflux disease   . Heart attack (HCC)   . History of cardiac catheterization    Normal coronaries December 2016  . Noncompliance    Major problem leading to declining health and recurrent hospitalization  . Nonischemic cardiomyopathy (HCC)    LVEF 20-25%  . On home O2    3L N/C  . OSA (obstructive sleep apnea)   . Osteoarthritis   . Peptic ulcer disease     Past Surgical History:  Procedure Laterality Date  . CARDIAC CATHETERIZATION N/A 06/14/2015   Procedure: Right/Left Heart Cath and Coronary Angiography;  Surgeon: Runell Gess, MD;  Location: [facility] INVASIVE CV LAB;  Service: Cardiovascular;  Laterality: N/A;     Current Outpatient Prescriptions  Medication Sig Dispense Refill  . albuterol (PROVENTIL) (2.5 MG/3ML) 0.083% nebulizer solution Take 2.5 mg by nebulization every 6 (six) hours as needed for wheezing or shortness of breath.    Marland Kitchen aspirin 81 MG  chewable tablet Chew 81 mg by mouth as needed for mild pain or moderate pain.     Marland Kitchen atorvastatin (LIPITOR) 40 MG tablet Take 1 tablet (40 mg total) by mouth daily at 6 PM. 30 tablet 0  . beclomethasone (QVAR) 80 MCG/ACT inhaler Inhale 2 puffs into the lungs 2 (two) times daily.     . bumetanide (BUMEX) 2 MG tablet Take 1 tablet (2 mg total) by mouth 2 (two) times daily. 60 tablet 1  . carvedilol (COREG) 25 MG tablet Take 1 tablet (25 mg total) by mouth 2 (two) times daily with a meal. 60 tablet 1  . docusate sodium (COLACE) 100 MG capsule Take 1 capsule (100 mg total) by mouth 2 (two) times daily. (Patient taking differently: Take 100 mg by mouth 2 (two) times daily as needed for mild constipation. ) 10 capsule 0  . ferrous gluconate (FERGON) 324 MG tablet Take 1 tablet (324 mg total) by mouth 2 (two) times daily with a meal. (Patient taking differently: Take 324 mg by mouth daily with breakfast. ) 60 tablet 0  . pantoprazole (PROTONIX) 40 MG tablet Take 1 tablet (40 mg total) by mouth daily at 6 (six) AM. (Patient taking differently: Take 40 mg by mouth every evening. ) 30 tablet 0  . potassium chloride (K-DUR,KLOR-CON) 20 MEQ tablet Take 1 tablet (20 mEq total) by mouth daily. 30 tablet  1  . rivaroxaban (XARELTO) 20 MG TABS tablet Take 1 tablet (20 mg total) by mouth daily with breakfast. (Patient taking differently: Take 20 mg by mouth daily with supper. ) 30 tablet 0  . tamsulosin (FLOMAX) 0.4 MG CAPS capsule Take 1 capsule (0.4 mg total) by mouth daily. 30 capsule 0   No current facility-administered medications for this visit.     Allergies:   Banana; Bee venom; Food; Lipitor [atorvastatin]; Aspirin; Metolazone; Oatmeal; Orange juice [orange oil]; Torsemide; Diltiazem; and Hydralazine    Social History:  The patient  reports that he quit smoking about 9 years ago. His smoking use included Cigarettes. He started smoking about 28 years ago. He has a 10.00 pack-year smoking history. He has never  used smokeless tobacco. He reports that he does not drink alcohol or use drugs.   Family History:  The patient's family history includes Aneurysm in his mother; Heart attack in his father; Hypertension in his sister; Stroke in his father.    ROS: All other systems are reviewed and negative. Unless otherwise mentioned in H&P    PHYSICAL EXAM: VS:  There were no vitals taken for this visit. , BMI There is no height or weight on file to calculate BMI. GEN: Well nourished, well developed, in no acute distress  HEENT: normal  Neck: no JVD, carotid bruits, or masses Cardiac: ***RRR; no murmurs, rubs, or gallops,no edema  Respiratory:  clear to auscultation bilaterally, normal work of breathing GI: soft, nontender, nondistended, + BS MS: no deformity or atrophy  Skin: warm and dry, no rash Neuro:  Strength and sensation are intact Psych: euthymic mood, full affect   EKG:  EKG {ACTION; IS/IS [id number]} ordered today. The ekg ordered today demonstrates ***   Recent Labs: 12/02/2016: TSH 2.954 02/07/2017: Magnesium 1.9 03/02/2017: ALT 18; B Natriuretic Peptide 514.0 03/05/2017: BUN 15; Creatinine, Ser 1.06; Hemoglobin 11.5; Platelets 207; Potassium 3.2; Sodium 140    Lipid Panel    Component Value Date/Time   CHOL 139 06/14/2015 0328   TRIG 58 06/14/2015 0328   HDL 28 (L) 06/14/2015 0328   CHOLHDL 5.0 06/14/2015 0328   VLDL 12 06/14/2015 0328   LDLCALC 99 06/14/2015 0328      Wt Readings from Last 3 Encounters:  03/05/17 (!) 351 lb (159.2 kg)  03/04/17 (!) 351 lb (159.2 kg)  03/02/17 (!) 351 lb 4 oz (159.3 kg)      Other studies Reviewed: Echocardiogram 12-10-16 Procedure narrative: Transthoracic echocardiography. Image   quality was adequate. Intravenous contrast (Definity) was   administered. - Left ventricle: The cavity size was mildly dilated. Wall   thickness was increased in a pattern of mild LVH. Systolic   function was moderately reduced. The estimated  ejection fraction   was in the range of 35% to 40%. Diffuse hypokinesis. The study   was not technically sufficient to allow evaluation of LV   diastolic dysfunction due to atrial fibrillation. - Regional wall motion abnormality: Akinesis of the basal-mid   inferior myocardium. - Mitral valve: There was mild regurgitation. - Left atrium: The atrium was mildly dilated. - Right ventricle: The cavity size was mildly dilated. Systolic   function was mildly reduced. - Right atrium: The atrium was mildly dilated. - Atrial septum: There was a patent foramen ovale. - Tricuspid valve: There was mild regurgitation. - Pulmonary arteries: PA peak pressure: 56 mm Hg (S). - Inferior vena cava: The vessel was dilated. The respirophasic   diameter changes were blunted (<  50%), consistent with elevated   central venous pressure.  ASSESSMENT AND PLAN:  1.  ***   Current medicines are reviewed at length with the patient today.    Labs/ tests ordered today include: *** Bettey Mare. Liborio Nixon, ANP, AACC   03/08/2017 12:19 PM    [facility] [address], [place], [place] [address] Phone: [phone]; Fax: [phone]

## 2017-03-09 DIAGNOSIS — R0602 Shortness of breath: Secondary | ICD-10-CM | POA: Diagnosis not present

## 2017-03-09 DIAGNOSIS — Z888 Allergy status to other drugs, medicaments and biological substances status: Secondary | ICD-10-CM | POA: Diagnosis not present

## 2017-03-09 DIAGNOSIS — R600 Localized edema: Secondary | ICD-10-CM | POA: Diagnosis not present

## 2017-03-09 DIAGNOSIS — G4733 Obstructive sleep apnea (adult) (pediatric): Secondary | ICD-10-CM | POA: Diagnosis not present

## 2017-03-09 DIAGNOSIS — Z7951 Long term (current) use of inhaled steroids: Secondary | ICD-10-CM | POA: Diagnosis not present

## 2017-03-09 DIAGNOSIS — D649 Anemia, unspecified: Secondary | ICD-10-CM | POA: Diagnosis not present

## 2017-03-09 DIAGNOSIS — Z7901 Long term (current) use of anticoagulants: Secondary | ICD-10-CM | POA: Diagnosis not present

## 2017-03-09 DIAGNOSIS — K219 Gastro-esophageal reflux disease without esophagitis: Secondary | ICD-10-CM | POA: Diagnosis not present

## 2017-03-09 DIAGNOSIS — I482 Chronic atrial fibrillation: Secondary | ICD-10-CM | POA: Diagnosis not present

## 2017-03-09 DIAGNOSIS — I428 Other cardiomyopathies: Secondary | ICD-10-CM | POA: Diagnosis not present

## 2017-03-09 DIAGNOSIS — I252 Old myocardial infarction: Secondary | ICD-10-CM | POA: Diagnosis not present

## 2017-03-09 DIAGNOSIS — I493 Ventricular premature depolarization: Secondary | ICD-10-CM | POA: Diagnosis not present

## 2017-03-09 DIAGNOSIS — E7800 Pure hypercholesterolemia, unspecified: Secondary | ICD-10-CM | POA: Diagnosis not present

## 2017-03-09 DIAGNOSIS — Z9114 Patient's other noncompliance with medication regimen: Secondary | ICD-10-CM | POA: Diagnosis not present

## 2017-03-09 DIAGNOSIS — I110 Hypertensive heart disease with heart failure: Secondary | ICD-10-CM | POA: Diagnosis not present

## 2017-03-09 DIAGNOSIS — Z87891 Personal history of nicotine dependence: Secondary | ICD-10-CM | POA: Diagnosis not present

## 2017-03-09 DIAGNOSIS — I5043 Acute on chronic combined systolic (congestive) and diastolic (congestive) heart failure: Secondary | ICD-10-CM | POA: Diagnosis not present

## 2017-03-09 DIAGNOSIS — Z79899 Other long term (current) drug therapy: Secondary | ICD-10-CM | POA: Diagnosis not present

## 2017-03-09 DIAGNOSIS — I5033 Acute on chronic diastolic (congestive) heart failure: Secondary | ICD-10-CM | POA: Diagnosis not present

## 2017-03-09 DIAGNOSIS — Z886 Allergy status to analgesic agent status: Secondary | ICD-10-CM | POA: Diagnosis not present

## 2017-03-09 DIAGNOSIS — Z9119 Patient's noncompliance with other medical treatment and regimen: Secondary | ICD-10-CM | POA: Diagnosis not present

## 2017-03-09 DIAGNOSIS — J449 Chronic obstructive pulmonary disease, unspecified: Secondary | ICD-10-CM | POA: Diagnosis not present

## 2017-03-09 DIAGNOSIS — E876 Hypokalemia: Secondary | ICD-10-CM | POA: Diagnosis not present

## 2017-03-09 DIAGNOSIS — R0682 Tachypnea, not elsewhere classified: Secondary | ICD-10-CM | POA: Diagnosis not present

## 2017-03-10 DIAGNOSIS — I110 Hypertensive heart disease with heart failure: Secondary | ICD-10-CM | POA: Diagnosis not present

## 2017-03-10 DIAGNOSIS — D649 Anemia, unspecified: Secondary | ICD-10-CM | POA: Diagnosis not present

## 2017-03-10 DIAGNOSIS — I482 Chronic atrial fibrillation: Secondary | ICD-10-CM | POA: Diagnosis not present

## 2017-03-10 DIAGNOSIS — K219 Gastro-esophageal reflux disease without esophagitis: Secondary | ICD-10-CM | POA: Diagnosis not present

## 2017-03-10 DIAGNOSIS — Z7901 Long term (current) use of anticoagulants: Secondary | ICD-10-CM | POA: Diagnosis not present

## 2017-03-10 DIAGNOSIS — R2242 Localized swelling, mass and lump, left lower limb: Secondary | ICD-10-CM | POA: Diagnosis not present

## 2017-03-10 DIAGNOSIS — I5033 Acute on chronic diastolic (congestive) heart failure: Secondary | ICD-10-CM | POA: Diagnosis not present

## 2017-03-10 DIAGNOSIS — E7800 Pure hypercholesterolemia, unspecified: Secondary | ICD-10-CM | POA: Diagnosis not present

## 2017-03-10 DIAGNOSIS — I5043 Acute on chronic combined systolic (congestive) and diastolic (congestive) heart failure: Secondary | ICD-10-CM | POA: Diagnosis not present

## 2017-03-10 DIAGNOSIS — I509 Heart failure, unspecified: Secondary | ICD-10-CM | POA: Diagnosis not present

## 2017-03-10 DIAGNOSIS — R600 Localized edema: Secondary | ICD-10-CM | POA: Diagnosis not present

## 2017-03-10 DIAGNOSIS — Z79899 Other long term (current) drug therapy: Secondary | ICD-10-CM | POA: Diagnosis not present

## 2017-03-10 DIAGNOSIS — E876 Hypokalemia: Secondary | ICD-10-CM | POA: Diagnosis not present

## 2017-03-10 DIAGNOSIS — G4733 Obstructive sleep apnea (adult) (pediatric): Secondary | ICD-10-CM | POA: Diagnosis not present

## 2017-03-10 DIAGNOSIS — Z888 Allergy status to other drugs, medicaments and biological substances status: Secondary | ICD-10-CM | POA: Diagnosis not present

## 2017-03-10 DIAGNOSIS — I428 Other cardiomyopathies: Secondary | ICD-10-CM | POA: Diagnosis not present

## 2017-03-10 DIAGNOSIS — Z886 Allergy status to analgesic agent status: Secondary | ICD-10-CM | POA: Diagnosis not present

## 2017-03-10 DIAGNOSIS — Z9119 Patient's noncompliance with other medical treatment and regimen: Secondary | ICD-10-CM | POA: Diagnosis not present

## 2017-03-10 DIAGNOSIS — Z87891 Personal history of nicotine dependence: Secondary | ICD-10-CM | POA: Diagnosis not present

## 2017-03-10 DIAGNOSIS — I493 Ventricular premature depolarization: Secondary | ICD-10-CM | POA: Diagnosis not present

## 2017-03-10 DIAGNOSIS — Z9114 Patient's other noncompliance with medication regimen: Secondary | ICD-10-CM | POA: Diagnosis not present

## 2017-03-10 DIAGNOSIS — I252 Old myocardial infarction: Secondary | ICD-10-CM | POA: Diagnosis not present

## 2017-03-10 DIAGNOSIS — Z7951 Long term (current) use of inhaled steroids: Secondary | ICD-10-CM | POA: Diagnosis not present

## 2017-03-10 DIAGNOSIS — J449 Chronic obstructive pulmonary disease, unspecified: Secondary | ICD-10-CM | POA: Diagnosis not present

## 2017-03-10 DIAGNOSIS — R0682 Tachypnea, not elsewhere classified: Secondary | ICD-10-CM | POA: Diagnosis not present

## 2017-03-10 IMAGING — DX DG CHEST 2V
2 series · 2 of 2 positions shown · non-contrast
Comparison: Radiograph dated 06/13/2015

CLINICAL DATA: 49-year-old male with chest pain and shortness of
breath

EXAM:
CHEST  2 VIEW

[chest pa]
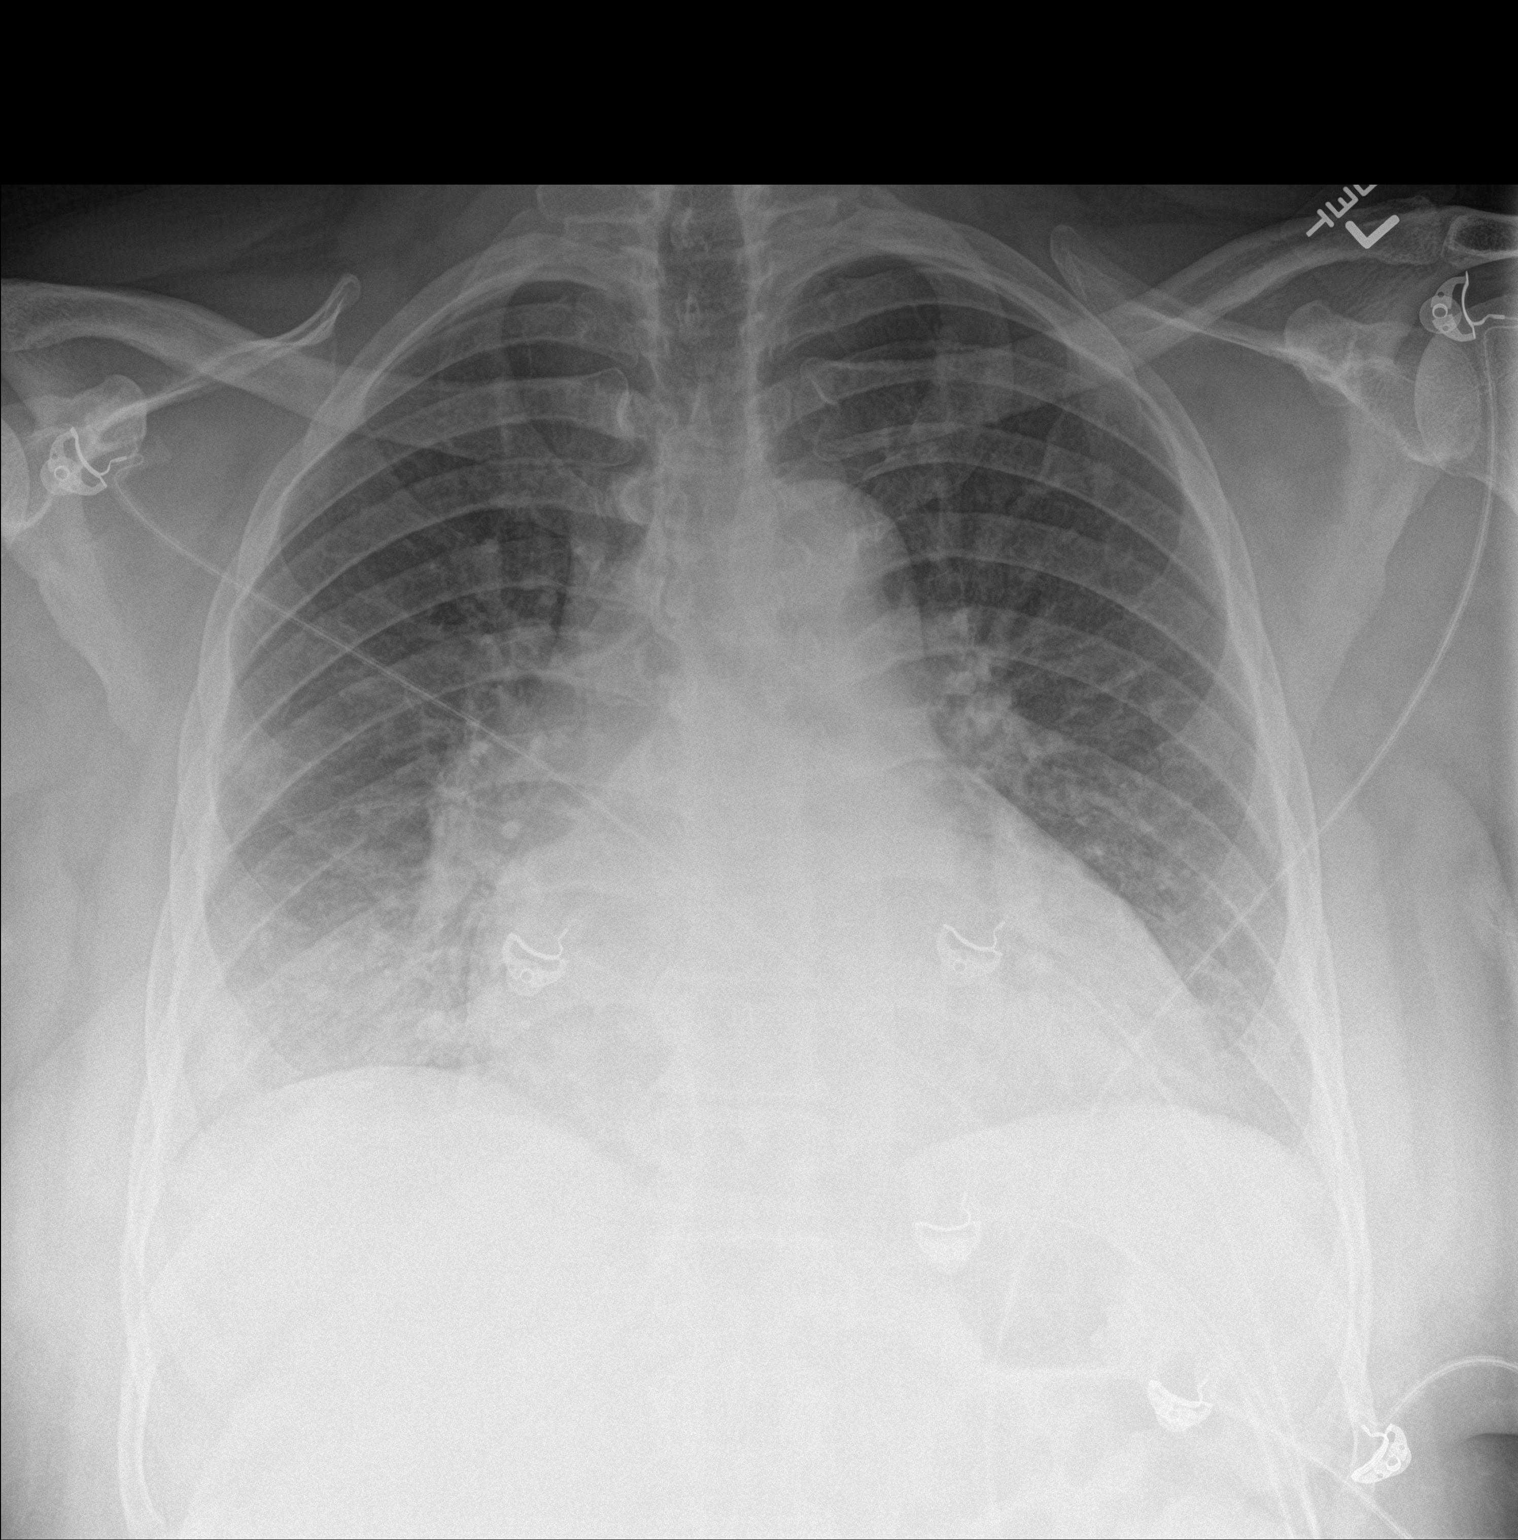

[chest lat]
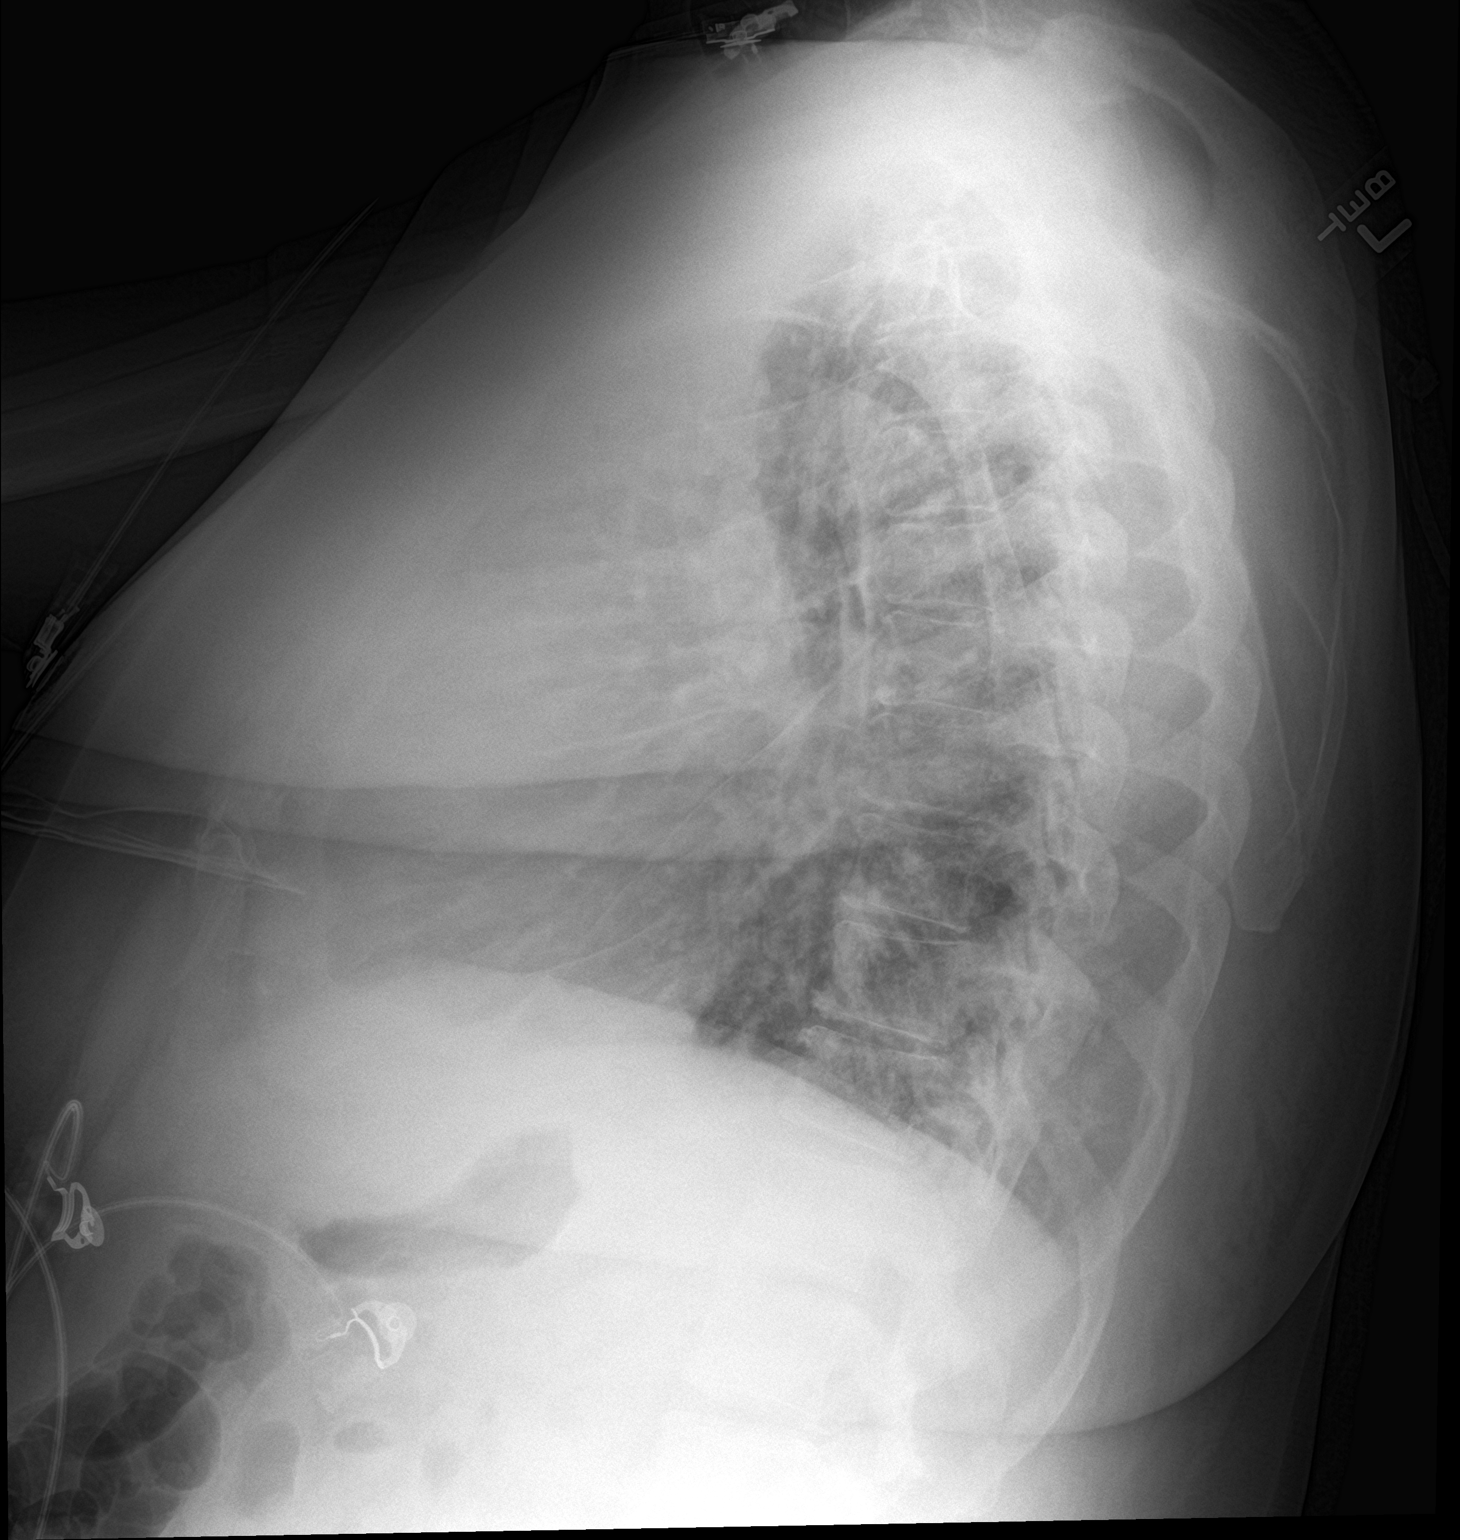

[2 of 2 positions shown; findings below may reference images not displayed]

FINDINGS: There is stable cardiomegaly. There is central vascular and and
bibasilar interstitial prominence similar to prior study likely
representing a degree of congestion or edema. Superimposed pneumonia
is not excluded. There is no focal consolidation, pleural effusion,
pneumothorax. The osseous structures appear unremarkable.
IMPRESSION: Cardiomegaly with congestive changes versus edema. No focal
consolidation.

## 2017-03-11 DIAGNOSIS — R600 Localized edema: Secondary | ICD-10-CM | POA: Diagnosis not present

## 2017-03-11 DIAGNOSIS — I493 Ventricular premature depolarization: Secondary | ICD-10-CM | POA: Diagnosis not present

## 2017-03-11 DIAGNOSIS — I482 Chronic atrial fibrillation: Secondary | ICD-10-CM | POA: Diagnosis not present

## 2017-03-11 DIAGNOSIS — G4733 Obstructive sleep apnea (adult) (pediatric): Secondary | ICD-10-CM | POA: Diagnosis not present

## 2017-03-11 DIAGNOSIS — I110 Hypertensive heart disease with heart failure: Secondary | ICD-10-CM | POA: Diagnosis not present

## 2017-03-15 DIAGNOSIS — I110 Hypertensive heart disease with heart failure: Secondary | ICD-10-CM | POA: Diagnosis not present

## 2017-03-15 DIAGNOSIS — I272 Pulmonary hypertension, unspecified: Secondary | ICD-10-CM | POA: Diagnosis not present

## 2017-03-15 DIAGNOSIS — J449 Chronic obstructive pulmonary disease, unspecified: Secondary | ICD-10-CM | POA: Diagnosis not present

## 2017-03-15 DIAGNOSIS — N182 Chronic kidney disease, stage 2 (mild): Secondary | ICD-10-CM | POA: Diagnosis not present

## 2017-03-15 DIAGNOSIS — I482 Chronic atrial fibrillation: Secondary | ICD-10-CM | POA: Diagnosis not present

## 2017-03-18 IMAGING — DX DG CHEST 2V
2 series · 2 of 2 positions shown · non-contrast
Comparison: 07/04/2015

CLINICAL DATA: Heart failure, unspecified

EXAM:
CHEST  2 VIEW

[chest pa]
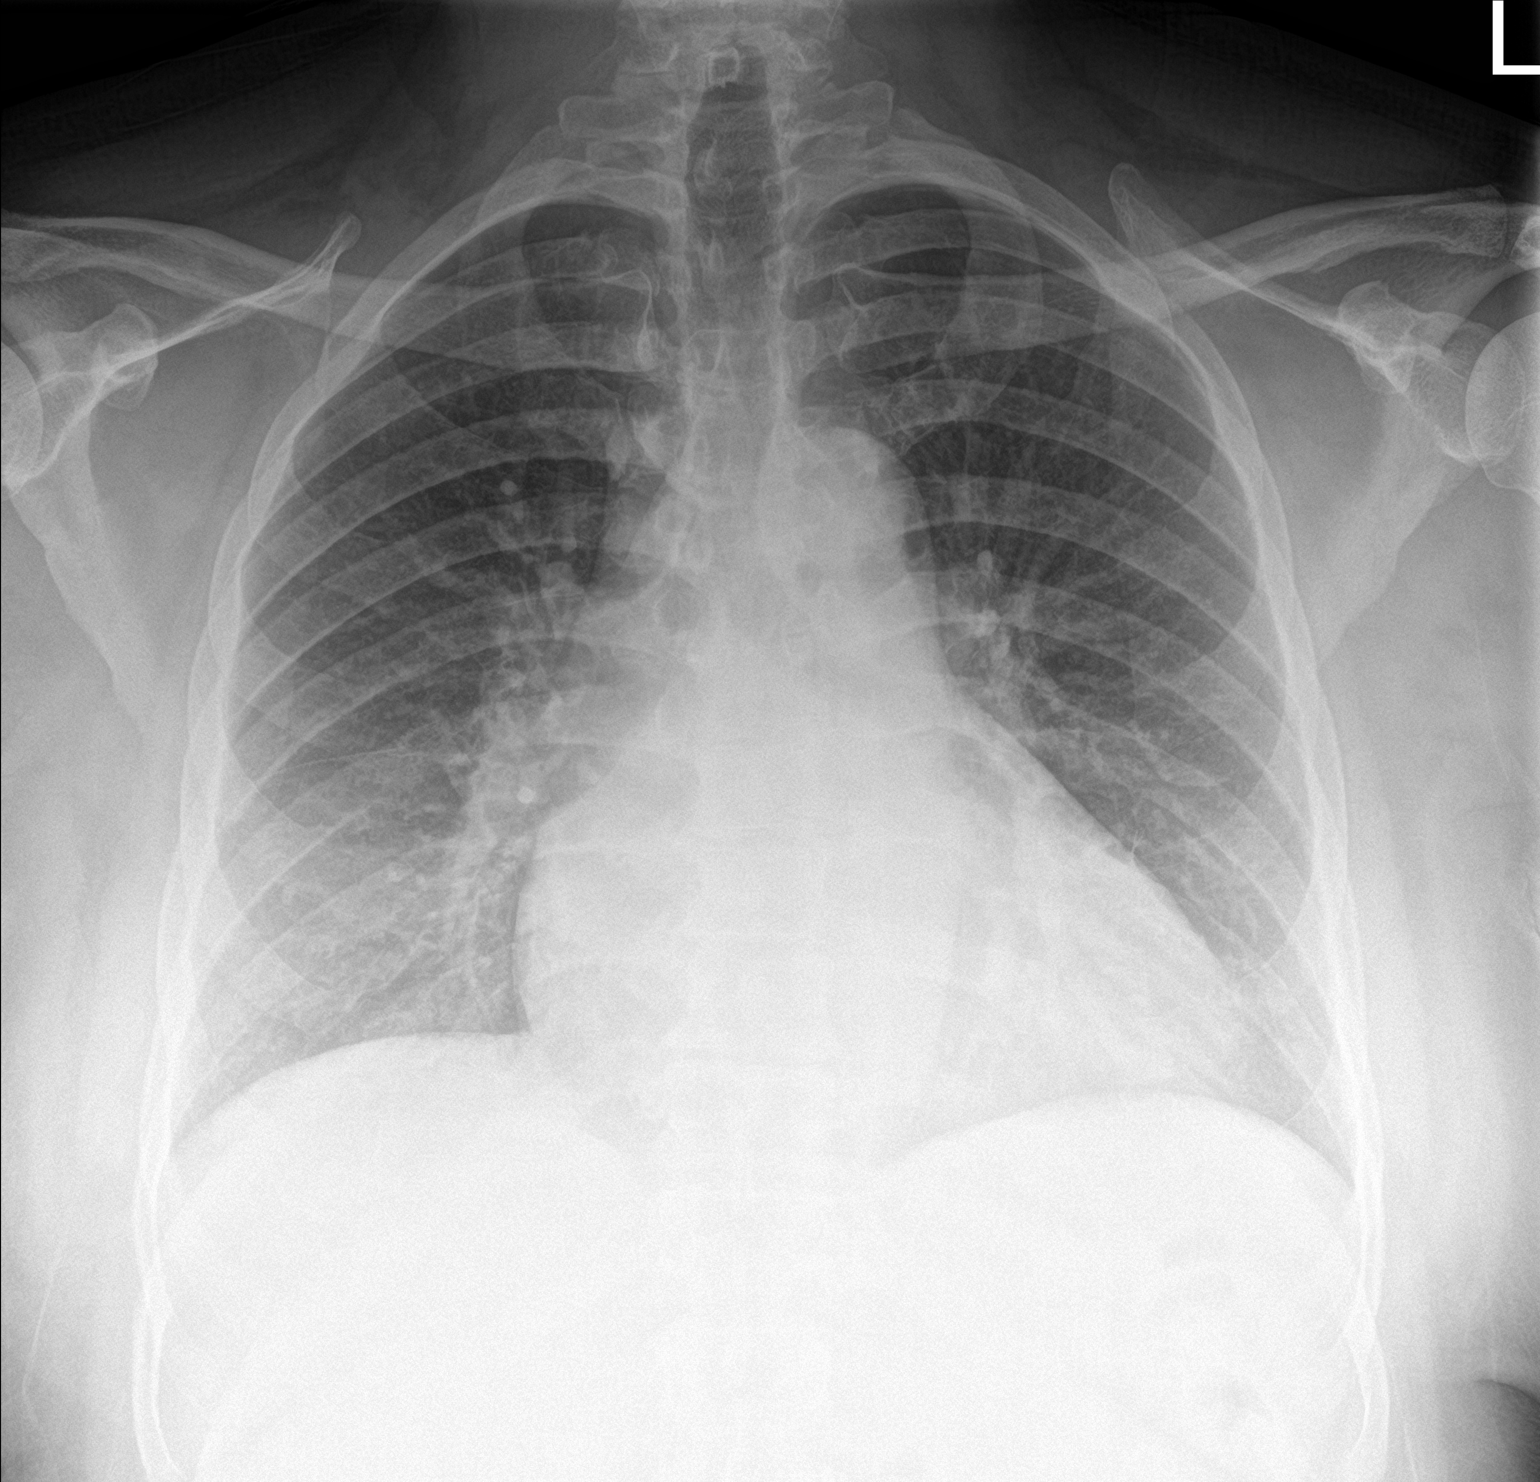

[chest lat]
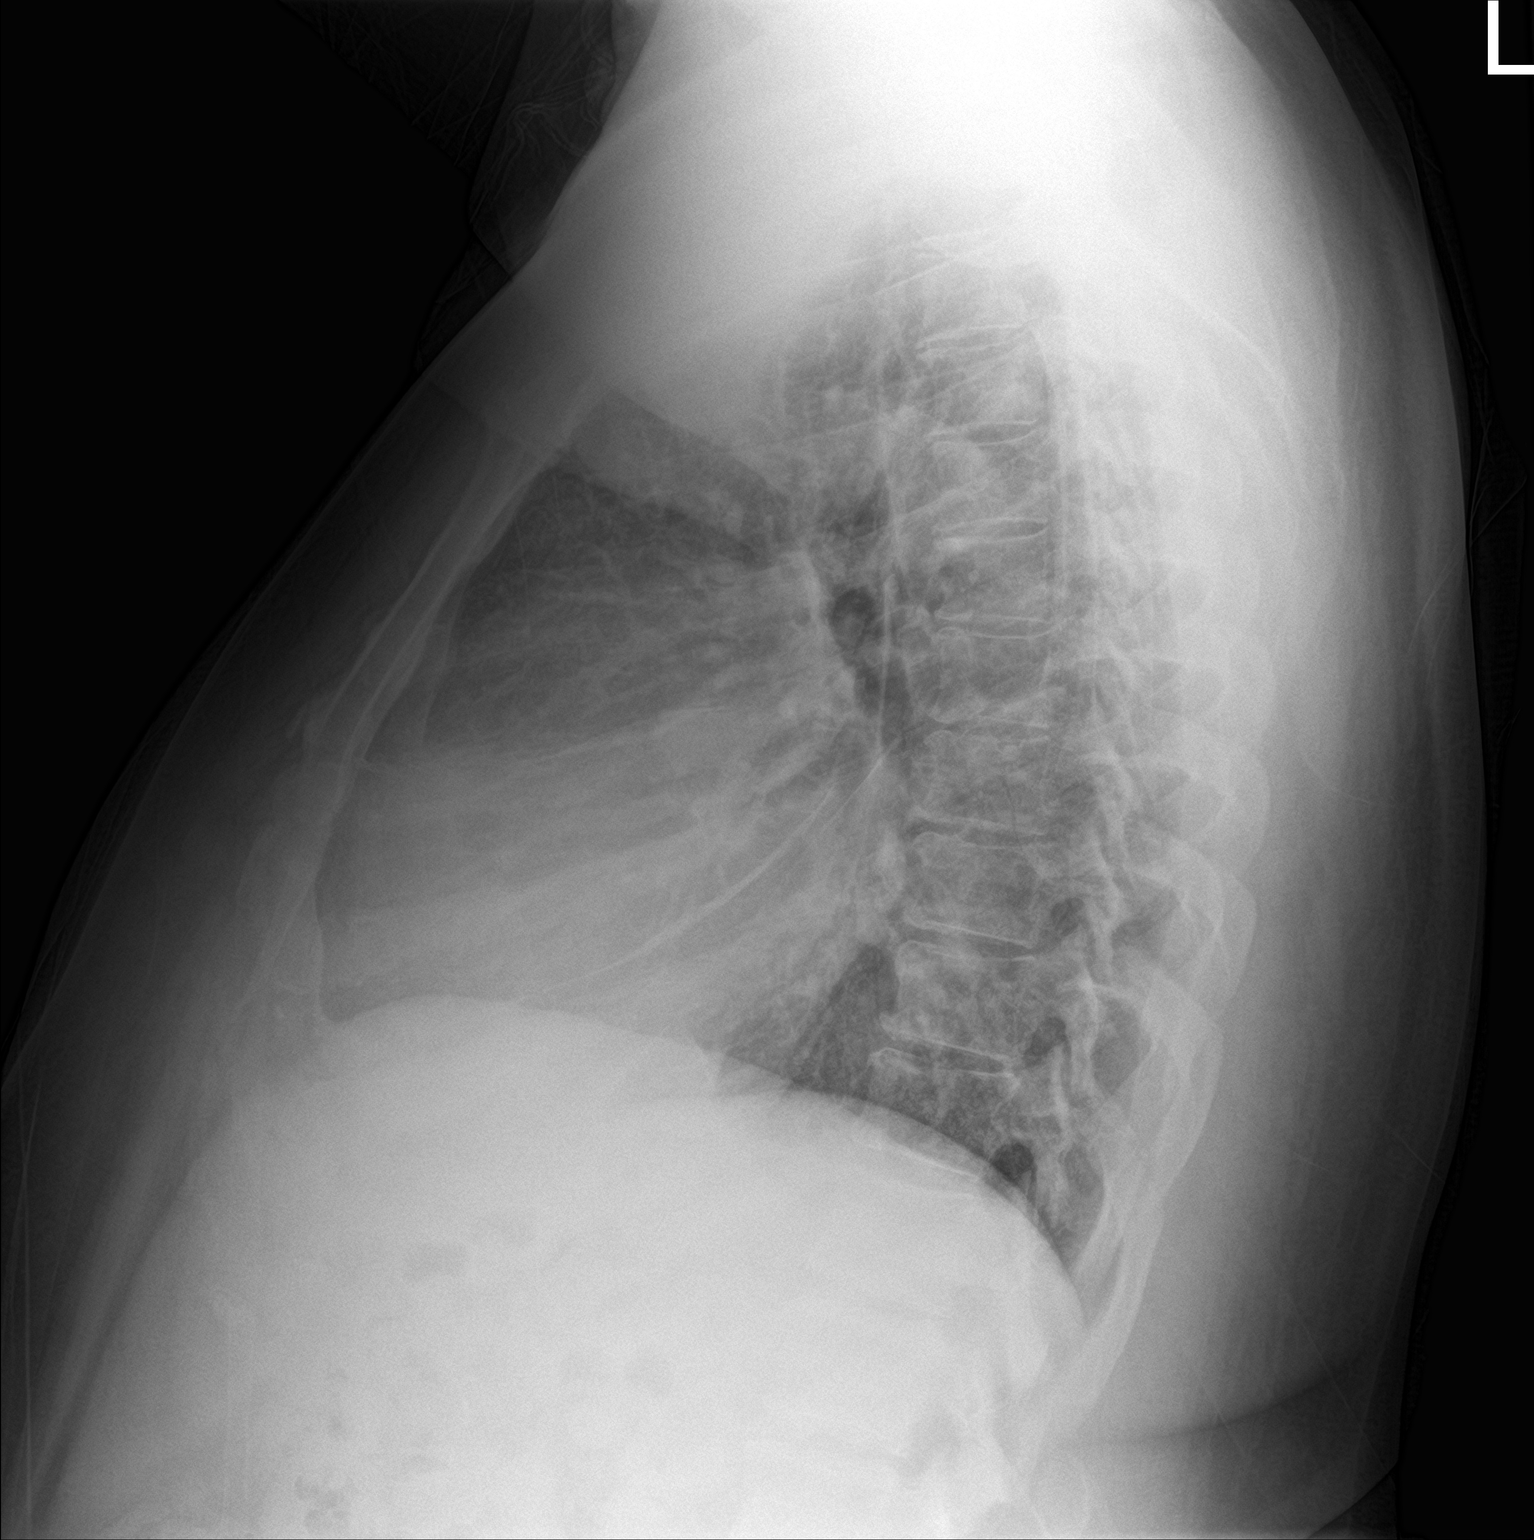

[2 of 2 positions shown; findings below may reference images not displayed]

FINDINGS: Stable cardiomegaly. There is pulmonary venous congestion without
edema or effusion. Stable aortic and hilar contours.
IMPRESSION: Cardiomegaly and mild venous congestion.

## 2017-03-25 ENCOUNTER — Telehealth: Payer: Self-pay | Admitting: *Deleted

## 2017-03-25 DIAGNOSIS — I509 Heart failure, unspecified: Secondary | ICD-10-CM

## 2017-03-25 MED ORDER — METOLAZONE 5 MG PO TABS: ORAL_TABLET | ORAL | 0 refills | Status: DC

## 2017-03-25 NOTE — Telephone Encounter (Signed)
Can give metolazone 5 mg daily x 3 days. Would check BMET too.

## 2017-03-25 NOTE — Telephone Encounter (Signed)
[name] from [employer] voiced understanding and will make pt aware - Metolazone sent to pharmacy and will mail pt lab work.

## 2017-03-25 NOTE — Telephone Encounter (Signed)
[name] says pt weight is up 348lbs since 9/23. C/o dizziness and some SOB. Wanted to know if pt should be on another diuretic currently taking Bumex. Has been to [facility] ED multiple times. Denies any chest pain says symptoms have gradually getting worse since 9/23

## 2017-03-31 DIAGNOSIS — I509 Heart failure, unspecified: Secondary | ICD-10-CM | POA: Diagnosis not present

## 2017-04-08 IMAGING — DX DG CHEST 2V
2 series · 2 of 2 positions shown · non-contrast
Comparison: July 12, 2015

CLINICAL DATA: Three-day history of cough and shortness of breath

EXAM:
CHEST  2 VIEW

[chest pa]
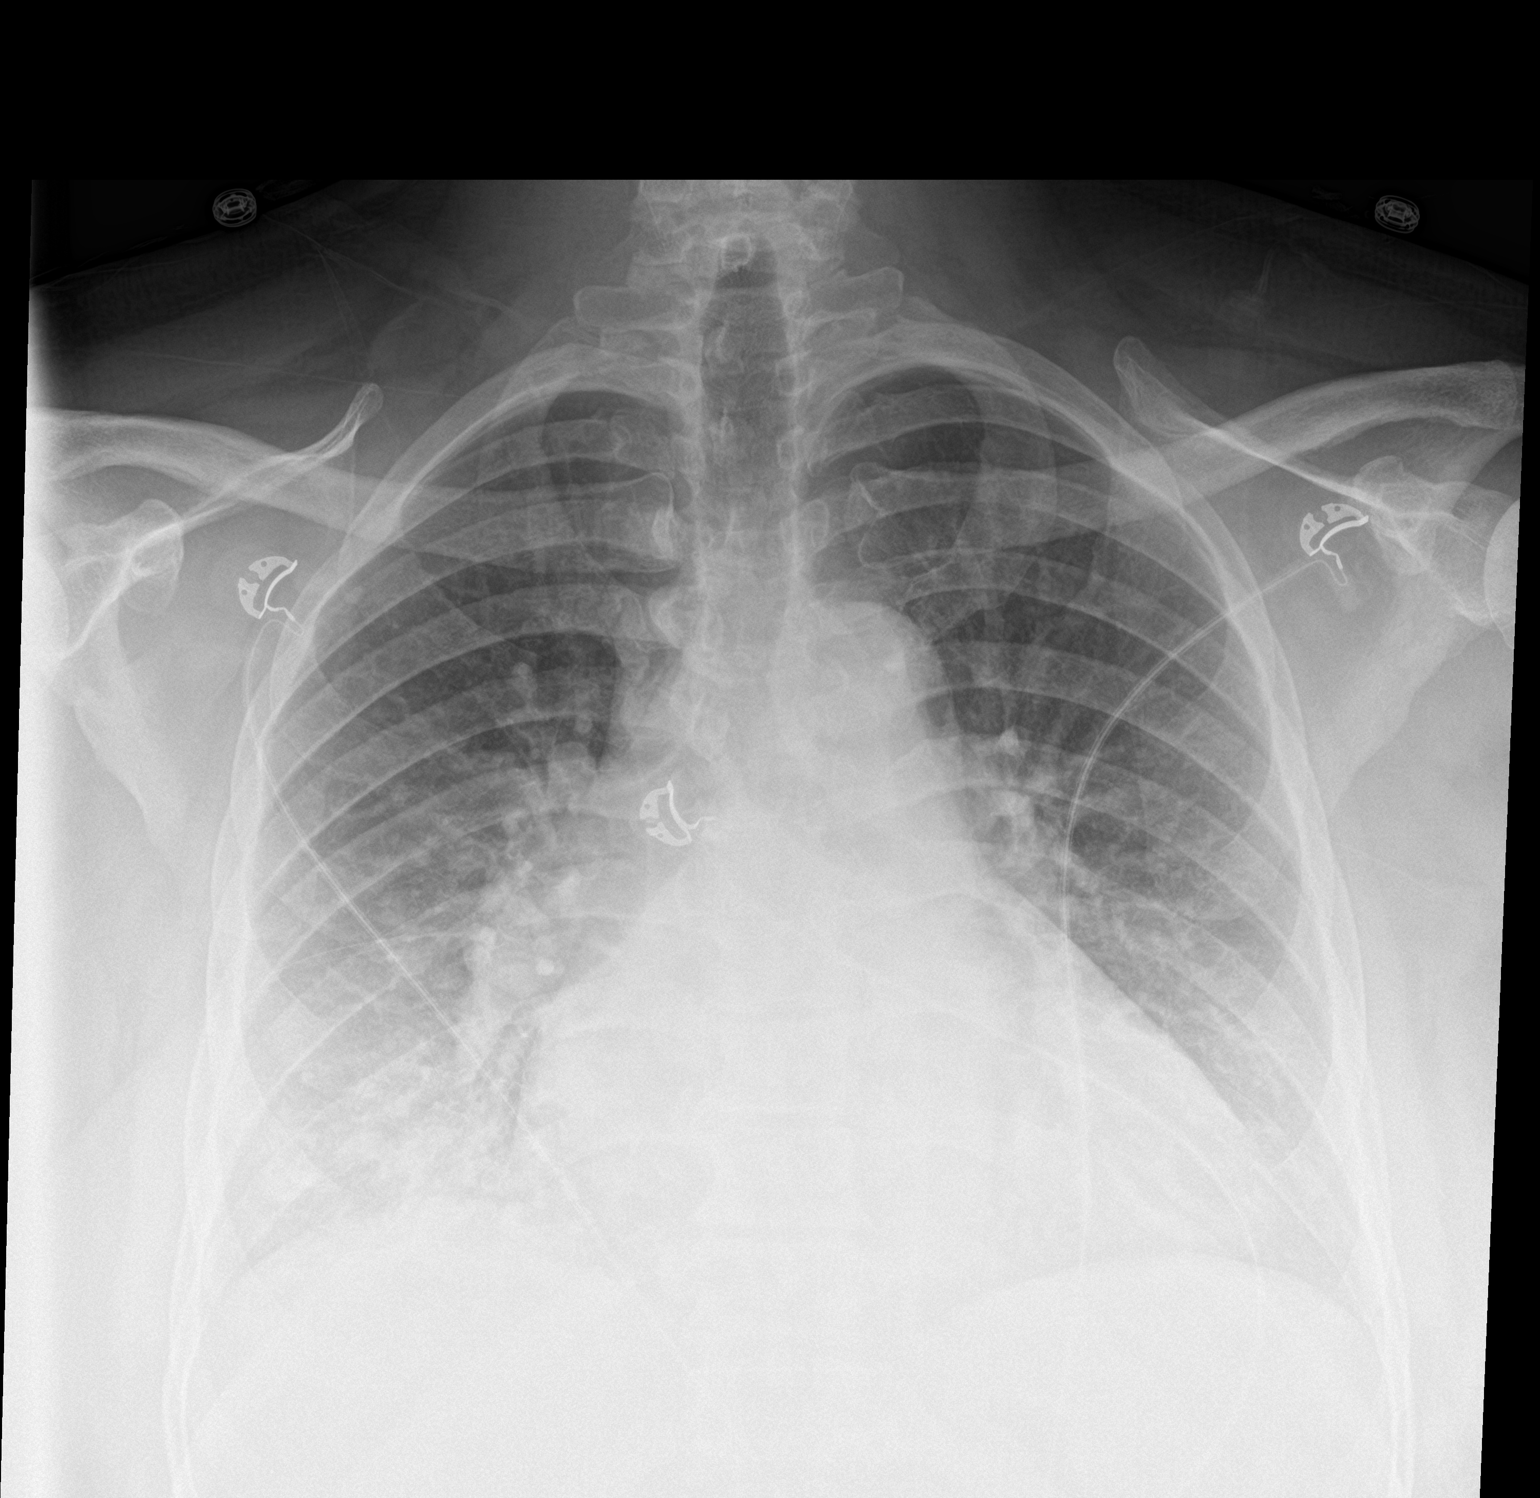

[chest lat]
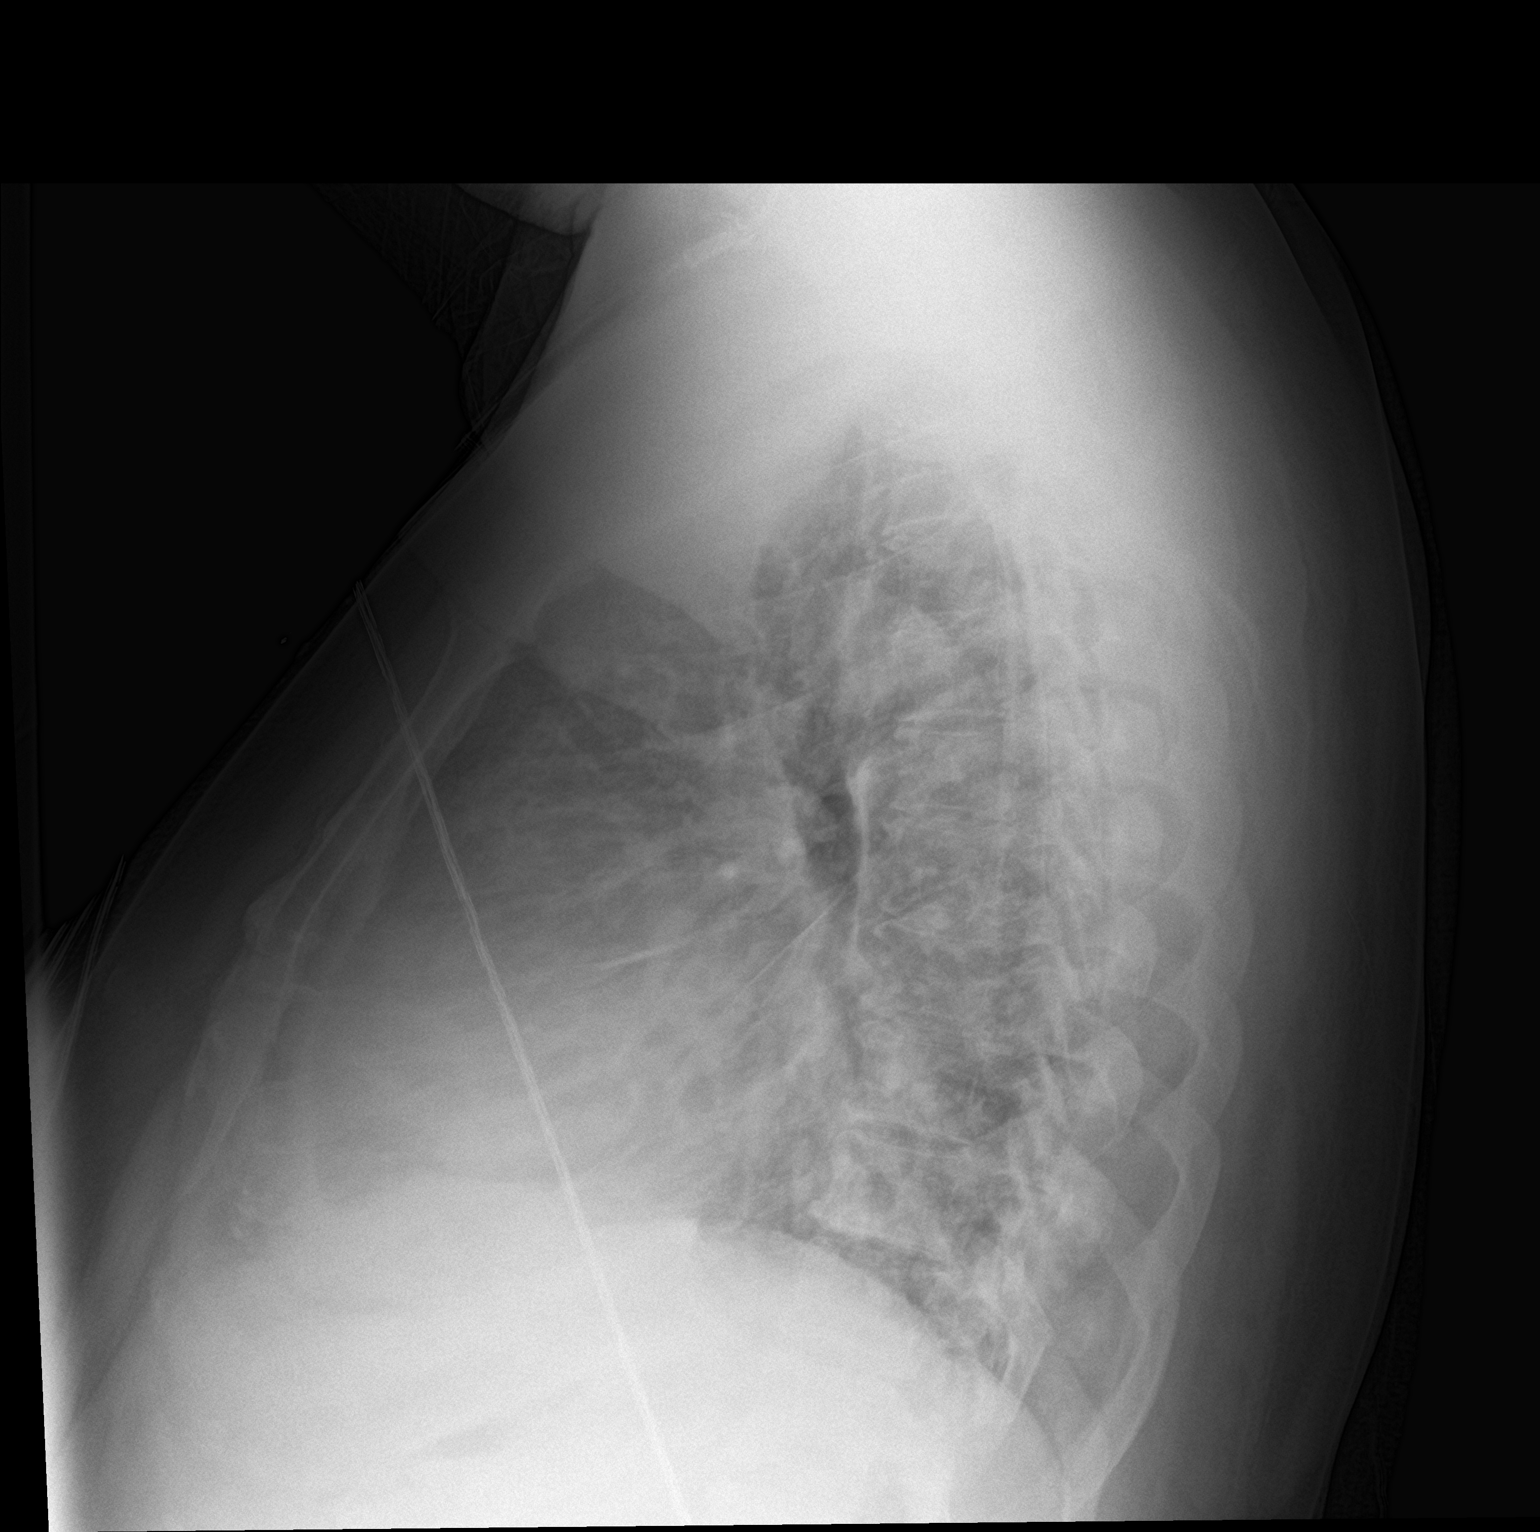

[2 of 2 positions shown; findings below may reference images not displayed]

FINDINGS: There is patchy infiltrate in the right lower lobe. Lungs elsewhere
clear. There is stable cardiomegaly with pulmonary vascular within
normal limits. No adenopathy. No bone lesions.
IMPRESSION: Right lower lobe infiltrate. Lungs elsewhere clear. Stable cardiac
enlargement. Followup PA and lateral chest radiographs recommended
in 3-4 weeks following trial of antibiotic therapy to ensure
resolution and exclude underlying malignancy.

## 2017-04-09 ENCOUNTER — Encounter (HOSPITAL_COMMUNITY): Payer: Self-pay | Admitting: *Deleted

## 2017-04-09 ENCOUNTER — Emergency Department (HOSPITAL_COMMUNITY): Payer: Medicare Other

## 2017-04-09 ENCOUNTER — Emergency Department (HOSPITAL_COMMUNITY)
Admission: EM | Admit: 2017-04-09 | Discharge: 2017-04-09 | Disposition: A | Discharge status: Home / Self Care | Payer: Medicare Other | Source: Home / Self Care | Attending: Emergency Medicine | Admitting: Emergency Medicine

## 2017-04-09 DIAGNOSIS — Z79899 Other long term (current) drug therapy: Secondary | ICD-10-CM

## 2017-04-09 DIAGNOSIS — R600 Localized edema: Secondary | ICD-10-CM

## 2017-04-09 DIAGNOSIS — R0602 Shortness of breath: Secondary | ICD-10-CM

## 2017-04-09 DIAGNOSIS — Z8249 Family history of ischemic heart disease and other diseases of the circulatory system: Secondary | ICD-10-CM | POA: Diagnosis not present

## 2017-04-09 DIAGNOSIS — Z66 Do not resuscitate: Secondary | ICD-10-CM | POA: Diagnosis not present

## 2017-04-09 DIAGNOSIS — I482 Chronic atrial fibrillation: Secondary | ICD-10-CM | POA: Diagnosis not present

## 2017-04-09 DIAGNOSIS — Z91030 Bee allergy status: Secondary | ICD-10-CM | POA: Diagnosis not present

## 2017-04-09 DIAGNOSIS — I130 Hypertensive heart and chronic kidney disease with heart failure and stage 1 through stage 4 chronic kidney disease, or unspecified chronic kidney disease: Secondary | ICD-10-CM | POA: Diagnosis not present

## 2017-04-09 DIAGNOSIS — E785 Hyperlipidemia, unspecified: Secondary | ICD-10-CM | POA: Diagnosis not present

## 2017-04-09 DIAGNOSIS — M1990 Unspecified osteoarthritis, unspecified site: Secondary | ICD-10-CM | POA: Diagnosis not present

## 2017-04-09 DIAGNOSIS — Z91018 Allergy to other foods: Secondary | ICD-10-CM | POA: Diagnosis not present

## 2017-04-09 DIAGNOSIS — I252 Old myocardial infarction: Secondary | ICD-10-CM

## 2017-04-09 DIAGNOSIS — J45909 Unspecified asthma, uncomplicated: Secondary | ICD-10-CM | POA: Insufficient documentation

## 2017-04-09 DIAGNOSIS — Z9981 Dependence on supplemental oxygen: Secondary | ICD-10-CM | POA: Diagnosis not present

## 2017-04-09 DIAGNOSIS — Z9111 Patient's noncompliance with dietary regimen: Secondary | ICD-10-CM | POA: Diagnosis not present

## 2017-04-09 DIAGNOSIS — N189 Chronic kidney disease, unspecified: Secondary | ICD-10-CM | POA: Insufficient documentation

## 2017-04-09 DIAGNOSIS — R05 Cough: Secondary | ICD-10-CM | POA: Insufficient documentation

## 2017-04-09 DIAGNOSIS — K219 Gastro-esophageal reflux disease without esophagitis: Secondary | ICD-10-CM | POA: Diagnosis not present

## 2017-04-09 DIAGNOSIS — R079 Chest pain, unspecified: Secondary | ICD-10-CM

## 2017-04-09 DIAGNOSIS — I429 Cardiomyopathy, unspecified: Secondary | ICD-10-CM | POA: Diagnosis not present

## 2017-04-09 DIAGNOSIS — M79604 Pain in right leg: Secondary | ICD-10-CM | POA: Diagnosis not present

## 2017-04-09 DIAGNOSIS — I5043 Acute on chronic combined systolic (congestive) and diastolic (congestive) heart failure: Secondary | ICD-10-CM

## 2017-04-09 DIAGNOSIS — I5022 Chronic systolic (congestive) heart failure: Secondary | ICD-10-CM | POA: Insufficient documentation

## 2017-04-09 DIAGNOSIS — I110 Hypertensive heart disease with heart failure: Secondary | ICD-10-CM

## 2017-04-09 DIAGNOSIS — N182 Chronic kidney disease, stage 2 (mild): Secondary | ICD-10-CM | POA: Diagnosis not present

## 2017-04-09 DIAGNOSIS — Z886 Allergy status to analgesic agent status: Secondary | ICD-10-CM | POA: Diagnosis not present

## 2017-04-09 DIAGNOSIS — G4733 Obstructive sleep apnea (adult) (pediatric): Secondary | ICD-10-CM | POA: Diagnosis not present

## 2017-04-09 DIAGNOSIS — Z87891 Personal history of nicotine dependence: Secondary | ICD-10-CM | POA: Insufficient documentation

## 2017-04-09 DIAGNOSIS — Z8711 Personal history of peptic ulcer disease: Secondary | ICD-10-CM | POA: Diagnosis not present

## 2017-04-09 DIAGNOSIS — Z7982 Long term (current) use of aspirin: Secondary | ICD-10-CM

## 2017-04-09 DIAGNOSIS — Z9114 Patient's other noncompliance with medication regimen: Secondary | ICD-10-CM | POA: Diagnosis not present

## 2017-04-09 DIAGNOSIS — Z888 Allergy status to other drugs, medicaments and biological substances status: Secondary | ICD-10-CM | POA: Diagnosis not present

## 2017-04-09 DIAGNOSIS — R0682 Tachypnea, not elsewhere classified: Secondary | ICD-10-CM | POA: Diagnosis not present

## 2017-04-09 DIAGNOSIS — J9611 Chronic respiratory failure with hypoxia: Secondary | ICD-10-CM | POA: Diagnosis not present

## 2017-04-09 LAB — URINALYSIS, ROUTINE W REFLEX MICROSCOPIC
BILIRUBIN URINE: NEGATIVE
Bacteria, UA: NONE SEEN
GLUCOSE, UA: NEGATIVE mg/dL
Hgb urine dipstick: NEGATIVE
KETONES UR: NEGATIVE mg/dL
LEUKOCYTES UA: NEGATIVE
NITRITE: NEGATIVE
PH: 5 (ref 5.0–8.0)
Protein, ur: 100 mg/dL — AB
Specific Gravity, Urine: 1.02 (ref 1.005–1.030)

## 2017-04-09 LAB — BASIC METABOLIC PANEL
ANION GAP: 9 (ref 5–15)
BUN: 17 mg/dL (ref 6–20)
CALCIUM: 9 mg/dL (ref 8.9–10.3)
CO2: 27 mmol/L (ref 22–32)
CREATININE: 1.18 mg/dL (ref 0.61–1.24)
Chloride: 103 mmol/L (ref 101–111)
GLUCOSE: 94 mg/dL (ref 65–99)
Potassium: 3.2 mmol/L — ABNORMAL LOW (ref 3.5–5.1)
Sodium: 139 mmol/L (ref 135–145)

## 2017-04-09 LAB — BLOOD GAS, ARTERIAL
ACID-BASE EXCESS: 1.5 mmol/L (ref 0.0–2.0)
BICARBONATE: 25.8 mmol/L (ref 20.0–28.0)
Drawn by: 382351
FIO2: 28
O2 SAT: 96.4 %
PATIENT TEMPERATURE: 37
PCO2 ART: 40 mmHg (ref 32.0–48.0)
PO2 ART: 90.7 mmHg (ref 83.0–108.0)
pH, Arterial: 7.422 (ref 7.350–7.450)

## 2017-04-09 LAB — CBC
HCT: 37.1 % — ABNORMAL LOW (ref 39.0–52.0)
HEMOGLOBIN: 11.4 g/dL — AB (ref 13.0–17.0)
MCH: 25.9 pg — ABNORMAL LOW (ref 26.0–34.0)
MCHC: 30.7 g/dL (ref 30.0–36.0)
MCV: 84.3 fL (ref 78.0–100.0)
PLATELETS: 214 10*3/uL (ref 150–400)
RBC: 4.4 MIL/uL (ref 4.22–5.81)
RDW: 16.8 % — ABNORMAL HIGH (ref 11.5–15.5)
WBC: 3.9 10*3/uL — ABNORMAL LOW (ref 4.0–10.5)

## 2017-04-09 LAB — ETHANOL

## 2017-04-09 LAB — RAPID URINE DRUG SCREEN, HOSP PERFORMED
Amphetamines: NOT DETECTED
BARBITURATES: NOT DETECTED
Benzodiazepines: NOT DETECTED
Cocaine: NOT DETECTED
Opiates: NOT DETECTED
Tetrahydrocannabinol: NOT DETECTED

## 2017-04-09 MED ORDER — FUROSEMIDE 10 MG/ML IJ SOLN
80.0000 mg | Freq: Once | INTRAMUSCULAR | Status: AC
  Administered 2017-04-09: 80 mg via INTRAVENOUS
  Filled 2017-04-09: qty 8

## 2017-04-09 MED ORDER — NITROGLYCERIN 2 % TD OINT
1.0000 [in_us] | TOPICAL_OINTMENT | Freq: Once | TRANSDERMAL | Status: AC
  Administered 2017-04-09: 1 [in_us] via TOPICAL
  Filled 2017-04-09: qty 1

## 2017-04-09 NOTE — ED Notes (Signed)
Urine specimen obtained and given to [facility] from the lab

## 2017-04-09 NOTE — ED Provider Notes (Signed)
[facility] EMERGENCY DEPARTMENT Provider Note   CSN: [id number] Arrival date & time: 04/09/17  4098     History   Chief Complaint Chief Complaint  Patient presents with  . Shortness of Breath    HPI [name] is a 51 y.o. male.  The history is provided by the patient.  Shortness of Breath  This is a chronic problem. The problem occurs frequently.The problem has been gradually worsening. Associated symptoms include cough and leg swelling. Pertinent negatives include no fever and no vomiting. Associated symptoms comments: Chest tightness . Treatments tried: oral diuretics. The treatment provided no relief. Associated medical issues include heart failure.  Patient with h/o CHF, noncompliance, afib on anticoagulants, obesity, presents with worsening chronic shortness of breath.  He reports he uses 3liters oxygen at home He reports taking 3 bumex tablets yesterday at the direction of his physician but reports his symptoms have not improved He reports increased SOB and LE edema He also reports chest tightness   Past Medical History:  Diagnosis Date  . Allergic rhinitis   . Asthma   . Atrial fibrillation (HCC)   . CHF (congestive heart failure) (HCC)   . Essential hypertension   . Gastroesophageal reflux disease   . Heart attack (HCC)   . History of cardiac catheterization    Normal coronaries December 2016  . Noncompliance    Major problem leading to declining health and recurrent hospitalization  . Nonischemic cardiomyopathy (HCC)    LVEF 20-25%  . On home O2    3L N/C  . OSA (obstructive sleep apnea)   . Osteoarthritis   . Peptic ulcer disease     Patient Active Problem List   Diagnosis Date Noted  . Encounter for hospice care discussion   . Palliative care encounter   . Goals of care, counseling/discussion   . DNR (do not resuscitate)   . DNR (do not resuscitate) discussion   . Chronic congestive heart failure (HCC) 02/18/2017  . Acute systolic heart  failure (HCC) 11/91/4782  . Pedal edema 12/02/2016  . Acute CHF (congestive heart failure) (HCC) 11/09/2016  . Acute on chronic systolic CHF (congestive heart failure) (HCC) 08/19/2016  . CKD (chronic kidney disease), stage II 05/17/2016  . Coronary arteries, normal Dec 2016 01/23/2016  . Chronic anticoagulation-Xarelto 01/23/2016  . NSVT (nonsustained ventricular tachycardia) (HCC) 01/23/2016  . Elevated troponin 12/21/2015  . Nonischemic cardiomyopathy (HCC)   . Morbid obesity due to excess calories (HCC)   . Noncompliance with diet and medication regimen 12/02/2015  . OSA (obstructive sleep apnea) 09/20/2015  . Pulmonary hypertension (HCC) 09/19/2015  . Normocytic anemia 09/19/2015  . Chronic atrial fibrillation (HCC)   . Dyspnea on exertion 05/28/2015  . Acute respiratory failure with hypoxia (HCC) 04/01/2015  . Hypokalemia 04/01/2015  . Obesity hypoventilation syndrome (HCC) 08/08/2014  . GERD (gastroesophageal reflux disease) 08/08/2014  . Edema of right lower extremity 06/21/2014  . Fecal urgency 06/21/2014  . Asthma 02/11/2009  . Hyperlipidemia 07/12/2008  . OBESITY, MORBID 07/12/2008  . Anxiety state 07/12/2008  . DEPRESSION 07/12/2008  . Essential hypertension 07/12/2008  . ALLERGIC RHINITIS 07/12/2008  . Peptic ulcer 07/12/2008  . OSTEOARTHRITIS 07/12/2008    Past Surgical History:  Procedure Laterality Date  . CARDIAC CATHETERIZATION N/A 06/14/2015   Procedure: Right/Left Heart Cath and Coronary Angiography;  Surgeon: Runell Gess, MD;  Location: [facility] INVASIVE CV LAB;  Service: Cardiovascular;  Laterality: N/A;       Home Medications  Prior to Admission medications   Medication Sig Start Date End Date Taking? Authorizing Provider  albuterol (PROVENTIL) (2.5 MG/3ML) 0.083% nebulizer solution Take 2.5 mg by nebulization every 6 (six) hours as needed for wheezing or shortness of breath.    [provider]  aspirin 81 MG chewable tablet Chew 81 mg  by mouth as needed for mild pain or moderate pain.     [provider]  atorvastatin (LIPITOR) 40 MG tablet Take 1 tablet (40 mg total) by mouth daily at 6 PM. 06/25/16   Kathlen Mody, MD  beclomethasone (QVAR) 80 MCG/ACT inhaler Inhale 2 puffs into the lungs 2 (two) times daily.     [provider]  bumetanide (BUMEX) 2 MG tablet Take 1 tablet (2 mg total) by mouth 2 (two) times daily. 11/13/16   Catarina Hartshorn, MD  carvedilol (COREG) 25 MG tablet Take 1 tablet (25 mg total) by mouth 2 (two) times daily with a meal. 11/13/16   Tat, Onalee Hua, MD  docusate sodium (COLACE) 100 MG capsule Take 1 capsule (100 mg total) by mouth 2 (two) times daily. Patient taking differently: Take 100 mg by mouth 2 (two) times daily as needed for mild constipation.  06/25/16   Kathlen Mody, MD  ferrous gluconate (FERGON) 324 MG tablet Take 1 tablet (324 mg total) by mouth 2 (two) times daily with a meal. Patient taking differently: Take 324 mg by mouth daily with breakfast.  06/25/16   Kathlen Mody, MD  metolazone (ZAROXOLYN) 5 MG tablet TAKE ONE TABLET DAILY FOR 3 DAYS THEN STOP 03/25/17   [name], MD  pantoprazole (PROTONIX) 40 MG tablet Take 1 tablet (40 mg total) by mouth daily at 6 (six) AM. Patient taking differently: Take 40 mg by mouth every evening.  06/26/16   Kathlen Mody, MD  potassium chloride (K-DUR,KLOR-CON) 20 MEQ tablet Take 1 tablet (20 mEq total) by mouth daily. 02/20/17   Houston Siren, MD  rivaroxaban (XARELTO) 20 MG TABS tablet Take 1 tablet (20 mg total) by mouth daily with breakfast. Patient taking differently: Take 20 mg by mouth daily with supper.  06/26/16   Kathlen Mody, MD  tamsulosin (FLOMAX) 0.4 MG CAPS capsule Take 1 capsule (0.4 mg total) by mouth daily. 06/25/16   Kathlen Mody, MD    Family History Family History  Problem Relation Age of Onset  . Stroke Father   . Heart attack Father   . Aneurysm Mother        Cerebral aneurysm  . Hypertension Sister   . Colon cancer  Neg Hx   . Inflammatory bowel disease Neg Hx   . Liver disease Neg Hx     Social History Social History  Substance Use Topics  . Smoking status: Former Smoker    Packs/day: 0.50    Years: 20.00    Types: Cigarettes    Start date: 04/26/1988    Quit date: 06/23/2007  . Smokeless tobacco: Never Used     Comment: 1 ppd former smoker  . Alcohol use No     Comment: No etoh since 2009     Allergies   Banana; Bee venom; Food; Lipitor [atorvastatin]; Aspirin; Metolazone; Oatmeal; Orange juice [orange oil]; Torsemide; Diltiazem; and Hydralazine   Review of Systems Review of Systems  Constitutional: Negative for fever.  Respiratory: Positive for cough and shortness of breath.   Cardiovascular: Positive for leg swelling.  Gastrointestinal: Negative for vomiting.  All other systems reviewed and are negative.    Physical  Exam Updated Vital Signs BP (!) 177/117 (BP Location: Left Arm)   Pulse 96   Temp 98.3 F (36.8 C) (Oral)   Resp 20   Ht 1.829 m (6')   Wt (!) 147.4 kg (325 lb)   SpO2 98%   BMI 44.08 kg/m   Physical Exam CONSTITUTIONAL: Chronically ill appearing, mildly tachypneic HEAD: Normocephalic/atraumatic ENMT: Mucous membranes moist NECK: supple no meningeal signs SPINE/BACK:entire spine nontender CV: irregular, no loud murmurs, distant heart sounds LUNGS: mild tachypnea.  Decreased BS noted bilaterally, limited due to body size ABDOMEN: soft, nontender,obese GU:no cva tenderness NEURO: Pt is awake/alert moves all extremitiesx4. Pt appears mildly sedated EXTREMITIES: pulses normal/equal, full ROM, chronic 4+ pitting edema to bilateral Lower extremities SKIN: warm, color normal PSYCH: no abnormalities of mood noted, alert and oriented to situation   ED Treatments / Results  Labs (all labs ordered are listed, but only abnormal results are displayed) Labs Reviewed  BASIC METABOLIC PANEL - Abnormal; Notable for the following:       Result Value   Potassium  3.2 (*)    All other components within normal limits  CBC - Abnormal; Notable for the following:    WBC 3.9 (*)    Hemoglobin 11.4 (*)    HCT 37.1 (*)    MCH 25.9 (*)    RDW 16.8 (*)    All other components within normal limits  URINALYSIS, ROUTINE W REFLEX MICROSCOPIC - Abnormal; Notable for the following:    Protein, ur 100 (*)    Squamous Epithelial / LPF 0-5 (*)    All other components within normal limits  BLOOD GAS, ARTERIAL  RAPID URINE DRUG SCREEN, HOSP PERFORMED  ETHANOL    EKG  EKG Interpretation  Date/Time:  Friday April 09 2017 03:37:24 EDT Ventricular Rate:  94 PR Interval:    QRS Duration: 94 QT Interval:  375 QTC Calculation: 469 R Axis:   74 Text Interpretation:  Atrial fibrillation Borderline repolarization abnormality No significant change since last tracing Confirmed by Zadie RhineWickline, Watt Geiler ([id number]) on 04/09/2017 4:09:01 AM       Radiology Dg Chest Port 1 View  Result Date: 04/09/2017 CLINICAL DATA:  51 y/o  M; chest pain and shortness of breath. EXAM: PORTABLE CHEST 1 VIEW COMPARISON:  03/09/2017 chest radiograph FINDINGS: Stable cardiomegaly given projection and technique. Reticular and hazy opacities of the lungs are increased from prior study. No pleural effusion or pneumothorax. Bones are unremarkable. IMPRESSION: Stable cardiomegaly. Interstitial and mild alveolar pulmonary edema. Electronically Signed   By: Mitzi HansenLance  Furusawa-Stratton M.D.   On: 04/09/2017 04:20    Procedures Procedures (including critical care time)  Medications Ordered in ED Medications  furosemide (LASIX) injection 80 mg (80 mg Intravenous Given 04/09/17 0507)  nitroGLYCERIN (NITROGLYN) 2 % ointment 1 inch (1 inch Topical Given 04/09/17 0507)     Initial Impression / Assessment and Plan / ED Course  I have reviewed the triage vital signs and the nursing notes.  Pertinent labs & imaging results that were available during my care of the patient were reviewed by me and  considered in my medical decision making (see chart for details).    4:44 AM Pt well known to the ED for CHF, questionable compliance here with increased dyspnea I have seen this patient before He appears to have mildly worsened dyspnea He also appears mildly sedated Will check ABG and UDS 7:16 AM Labs reassuring He has now had >2liters of urine output He is on home oxygen, sats  at 100% No distress noted He is more alert at this time Overall, I feel he is improved I feel he is appropriate for d/c home, and encouraged med compliance and to continue his home meds Advised PCP f/u in 3 days  Final Clinical Impressions(s) / ED Diagnoses   Final diagnoses:  SOB (shortness of breath)  Acute on chronic combined systolic and diastolic congestive heart failure [facility])    New Prescriptions New Prescriptions   No medications on file     [name], MD 04/09/17 [phone]

## 2017-04-09 NOTE — ED Notes (Signed)
Pt c/o not being able to breath and requesting O2; pt placed on O2 at 2L and pt given a urinal and pt sitting on side of bed

## 2017-04-09 NOTE — ED Triage Notes (Signed)
Pt c/o sob that started x 2 days ago with increased swelling to bilateral lower legs

## 2017-04-09 NOTE — ED Notes (Signed)
Emptied 1250 cc urine from urinal

## 2017-04-11 ENCOUNTER — Emergency Department (HOSPITAL_COMMUNITY): Payer: Medicare Other

## 2017-04-11 ENCOUNTER — Inpatient Hospital Stay (HOSPITAL_COMMUNITY)
Admission: EM | Admit: 2017-04-11 | Discharge: 2017-04-13 | DRG: 292 | Disposition: A | Discharge status: Home / Self Care | Payer: Medicare Other | Attending: Internal Medicine | Admitting: Internal Medicine

## 2017-04-11 ENCOUNTER — Encounter (HOSPITAL_COMMUNITY): Payer: Self-pay | Admitting: *Deleted

## 2017-04-11 DIAGNOSIS — Z8711 Personal history of peptic ulcer disease: Secondary | ICD-10-CM

## 2017-04-11 DIAGNOSIS — I252 Old myocardial infarction: Secondary | ICD-10-CM | POA: Diagnosis not present

## 2017-04-11 DIAGNOSIS — Z888 Allergy status to other drugs, medicaments and biological substances status: Secondary | ICD-10-CM

## 2017-04-11 DIAGNOSIS — I110 Hypertensive heart disease with heart failure: Principal | ICD-10-CM | POA: Diagnosis present

## 2017-04-11 DIAGNOSIS — Z9114 Patient's other noncompliance with medication regimen: Secondary | ICD-10-CM | POA: Diagnosis not present

## 2017-04-11 DIAGNOSIS — Z9111 Patient's noncompliance with dietary regimen: Secondary | ICD-10-CM

## 2017-04-11 DIAGNOSIS — Z7901 Long term (current) use of anticoagulants: Secondary | ICD-10-CM

## 2017-04-11 DIAGNOSIS — J9611 Chronic respiratory failure with hypoxia: Secondary | ICD-10-CM | POA: Diagnosis not present

## 2017-04-11 DIAGNOSIS — E785 Hyperlipidemia, unspecified: Secondary | ICD-10-CM | POA: Diagnosis present

## 2017-04-11 DIAGNOSIS — I482 Chronic atrial fibrillation, unspecified: Secondary | ICD-10-CM | POA: Diagnosis present

## 2017-04-11 DIAGNOSIS — Z8249 Family history of ischemic heart disease and other diseases of the circulatory system: Secondary | ICD-10-CM | POA: Diagnosis not present

## 2017-04-11 DIAGNOSIS — R0602 Shortness of breath: Secondary | ICD-10-CM | POA: Diagnosis not present

## 2017-04-11 DIAGNOSIS — Z91018 Allergy to other foods: Secondary | ICD-10-CM

## 2017-04-11 DIAGNOSIS — I10 Essential (primary) hypertension: Secondary | ICD-10-CM | POA: Diagnosis present

## 2017-04-11 DIAGNOSIS — Z6841 Body Mass Index (BMI) 40.0 and over, adult: Secondary | ICD-10-CM

## 2017-04-11 DIAGNOSIS — I5043 Acute on chronic combined systolic (congestive) and diastolic (congestive) heart failure: Secondary | ICD-10-CM | POA: Diagnosis present

## 2017-04-11 DIAGNOSIS — G4733 Obstructive sleep apnea (adult) (pediatric): Secondary | ICD-10-CM | POA: Diagnosis present

## 2017-04-11 DIAGNOSIS — Z79899 Other long term (current) drug therapy: Secondary | ICD-10-CM

## 2017-04-11 DIAGNOSIS — Z66 Do not resuscitate: Secondary | ICD-10-CM | POA: Diagnosis present

## 2017-04-11 DIAGNOSIS — I5023 Acute on chronic systolic (congestive) heart failure: Secondary | ICD-10-CM | POA: Diagnosis not present

## 2017-04-11 DIAGNOSIS — Z7951 Long term (current) use of inhaled steroids: Secondary | ICD-10-CM

## 2017-04-11 DIAGNOSIS — I429 Cardiomyopathy, unspecified: Secondary | ICD-10-CM | POA: Diagnosis present

## 2017-04-11 DIAGNOSIS — Z9981 Dependence on supplemental oxygen: Secondary | ICD-10-CM

## 2017-04-11 DIAGNOSIS — R609 Edema, unspecified: Secondary | ICD-10-CM | POA: Diagnosis not present

## 2017-04-11 DIAGNOSIS — E7849 Other hyperlipidemia: Secondary | ICD-10-CM

## 2017-04-11 DIAGNOSIS — M1990 Unspecified osteoarthritis, unspecified site: Secondary | ICD-10-CM | POA: Diagnosis present

## 2017-04-11 DIAGNOSIS — J45909 Unspecified asthma, uncomplicated: Secondary | ICD-10-CM | POA: Diagnosis not present

## 2017-04-11 DIAGNOSIS — Z886 Allergy status to analgesic agent status: Secondary | ICD-10-CM

## 2017-04-11 DIAGNOSIS — Z87891 Personal history of nicotine dependence: Secondary | ICD-10-CM

## 2017-04-11 DIAGNOSIS — K219 Gastro-esophageal reflux disease without esophagitis: Secondary | ICD-10-CM | POA: Diagnosis not present

## 2017-04-11 DIAGNOSIS — R069 Unspecified abnormalities of breathing: Secondary | ICD-10-CM | POA: Diagnosis not present

## 2017-04-11 DIAGNOSIS — Z91030 Bee allergy status: Secondary | ICD-10-CM

## 2017-04-11 DIAGNOSIS — I509 Heart failure, unspecified: Secondary | ICD-10-CM

## 2017-04-11 LAB — CBC WITH DIFFERENTIAL/PLATELET
Basophils Absolute: 0 10*3/uL (ref 0.0–0.1)
Basophils Relative: 0 %
EOS ABS: 0.1 10*3/uL (ref 0.0–0.7)
Eosinophils Relative: 3 %
HEMATOCRIT: 35.4 % — AB (ref 39.0–52.0)
HEMOGLOBIN: 11.1 g/dL — AB (ref 13.0–17.0)
LYMPHS ABS: 1 10*3/uL (ref 0.7–4.0)
LYMPHS PCT: 24 %
MCH: 25.9 pg — AB (ref 26.0–34.0)
MCHC: 31.4 g/dL (ref 30.0–36.0)
MCV: 82.7 fL (ref 78.0–100.0)
MONOS PCT: 7 %
Monocytes Absolute: 0.3 10*3/uL (ref 0.1–1.0)
NEUTROS ABS: 2.6 10*3/uL (ref 1.7–7.7)
NEUTROS PCT: 66 %
Platelets: 235 10*3/uL (ref 150–400)
RBC: 4.28 MIL/uL (ref 4.22–5.81)
RDW: 16.9 % — ABNORMAL HIGH (ref 11.5–15.5)
WBC: 4 10*3/uL (ref 4.0–10.5)

## 2017-04-11 LAB — BASIC METABOLIC PANEL
Anion gap: 9 (ref 5–15)
BUN: 19 mg/dL (ref 6–20)
CHLORIDE: 106 mmol/L (ref 101–111)
CO2: 28 mmol/L (ref 22–32)
Calcium: 9 mg/dL (ref 8.9–10.3)
Creatinine, Ser: 1.2 mg/dL (ref 0.61–1.24)
GFR calc Af Amer: >60 mL/min (ref >60)
GFR calc non Af Amer: >60 mL/min (ref >60)
Glucose, Bld: 93 mg/dL (ref 65–99)
POTASSIUM: 3.4 mmol/L — AB (ref 3.5–5.1)
Sodium: 143 mmol/L (ref 135–145)

## 2017-04-11 LAB — BLOOD GAS, ARTERIAL
Acid-Base Excess: 0.9 mmol/L (ref 0.0–2.0)
Bicarbonate: 25.2 mmol/L (ref 20.0–28.0)
DRAWN BY: 317771
O2 CONTENT: 4 L/min
O2 SAT: 97.7 %
pCO2 arterial: 39.7 mmHg (ref 32.0–48.0)
pH, Arterial: 7.415 (ref 7.350–7.450)
pO2, Arterial: 110 mmHg — ABNORMAL HIGH (ref 83.0–108.0)

## 2017-04-11 LAB — URINALYSIS, ROUTINE W REFLEX MICROSCOPIC
BILIRUBIN URINE: NEGATIVE
GLUCOSE, UA: NEGATIVE mg/dL
Hgb urine dipstick: NEGATIVE
KETONES UR: NEGATIVE mg/dL
LEUKOCYTES UA: NEGATIVE
NITRITE: NEGATIVE
PH: 7 (ref 5.0–8.0)
Protein, ur: NEGATIVE mg/dL
Specific Gravity, Urine: 1.005 (ref 1.005–1.030)

## 2017-04-11 LAB — BRAIN NATRIURETIC PEPTIDE: B Natriuretic Peptide: 628 pg/mL — ABNORMAL HIGH (ref 0.0–100.0)

## 2017-04-11 LAB — RAPID URINE DRUG SCREEN, HOSP PERFORMED
Amphetamines: NOT DETECTED
BARBITURATES: NOT DETECTED
Benzodiazepines: NOT DETECTED
COCAINE: NOT DETECTED
Opiates: NOT DETECTED
Tetrahydrocannabinol: NOT DETECTED

## 2017-04-11 LAB — TROPONIN I: TROPONIN I: 0.03 ng/mL — AB (ref <0.03)

## 2017-04-11 MED ORDER — ACETAMINOPHEN 325 MG PO TABS: 650.0000 mg | ORAL_TABLET | ORAL | Status: DC | PRN

## 2017-04-11 MED ORDER — LISINOPRIL 5 MG PO TABS
5.0000 mg | ORAL_TABLET | Freq: Every day | ORAL | Status: DC
  Administered 2017-04-13: 5 mg via ORAL
  Filled 2017-04-11 (×2): qty 1

## 2017-04-11 MED ORDER — ASPIRIN 81 MG PO CHEW
81.0000 mg | CHEWABLE_TABLET | Freq: Every day | ORAL | Status: DC
  Administered 2017-04-11 – 2017-04-13 (×3): 81 mg via ORAL
  Filled 2017-04-11 (×3): qty 1

## 2017-04-11 MED ORDER — DOCUSATE SODIUM 100 MG PO CAPS: 100.0000 mg | ORAL_CAPSULE | Freq: Two times a day (BID) | ORAL | Status: DC | PRN

## 2017-04-11 MED ORDER — PANTOPRAZOLE SODIUM 40 MG PO TBEC
40.0000 mg | DELAYED_RELEASE_TABLET | Freq: Every evening | ORAL | Status: DC
  Administered 2017-04-11 – 2017-04-12 (×2): 40 mg via ORAL
  Filled 2017-04-11 (×2): qty 1

## 2017-04-11 MED ORDER — ALBUTEROL SULFATE (2.5 MG/3ML) 0.083% IN NEBU: 2.5000 mg | INHALATION_SOLUTION | Freq: Four times a day (QID) | RESPIRATORY_TRACT | Status: DC | PRN

## 2017-04-11 MED ORDER — BECLOMETHASONE DIPROPIONATE 80 MCG/ACT IN AERS: 2.0000 | INHALATION_SPRAY | Freq: Two times a day (BID) | RESPIRATORY_TRACT | Status: DC

## 2017-04-11 MED ORDER — CARVEDILOL 12.5 MG PO TABS
25.0000 mg | ORAL_TABLET | Freq: Two times a day (BID) | ORAL | Status: DC
  Administered 2017-04-11 – 2017-04-13 (×5): 25 mg via ORAL
  Filled 2017-04-11 (×5): qty 2

## 2017-04-11 MED ORDER — POTASSIUM CHLORIDE CRYS ER 20 MEQ PO TBCR
20.0000 meq | EXTENDED_RELEASE_TABLET | Freq: Every day | ORAL | Status: DC
  Administered 2017-04-11 – 2017-04-13 (×3): 20 meq via ORAL
  Filled 2017-04-11 (×4): qty 1

## 2017-04-11 MED ORDER — FUROSEMIDE 10 MG/ML IJ SOLN
80.0000 mg | Freq: Once | INTRAMUSCULAR | Status: AC
  Administered 2017-04-11: 80 mg via INTRAVENOUS
  Filled 2017-04-11: qty 8

## 2017-04-11 MED ORDER — BUMETANIDE 1 MG PO TABS
2.0000 mg | ORAL_TABLET | Freq: Two times a day (BID) | ORAL | Status: DC
  Administered 2017-04-11: 2 mg via ORAL
  Filled 2017-04-11 (×3): qty 1

## 2017-04-11 MED ORDER — SODIUM CHLORIDE 0.9 % IV SOLN: 250.0000 mL | INTRAVENOUS | Status: DC | PRN

## 2017-04-11 MED ORDER — SODIUM CHLORIDE 0.9% FLUSH
3.0000 mL | INTRAVENOUS | Status: DC | PRN
  Administered 2017-04-12: 3 mL via INTRAVENOUS
  Filled 2017-04-11: qty 3

## 2017-04-11 MED ORDER — ONDANSETRON HCL 4 MG/2ML IJ SOLN: 4.0000 mg | Freq: Four times a day (QID) | INTRAMUSCULAR | Status: DC | PRN

## 2017-04-11 MED ORDER — BUDESONIDE 0.25 MG/2ML IN SUSP
0.2500 mg | Freq: Two times a day (BID) | RESPIRATORY_TRACT | Status: DC
  Administered 2017-04-11 – 2017-04-13 (×4): 0.25 mg via RESPIRATORY_TRACT
  Filled 2017-04-11 (×4): qty 2

## 2017-04-11 MED ORDER — FUROSEMIDE 10 MG/ML IJ SOLN
60.0000 mg | Freq: Two times a day (BID) | INTRAMUSCULAR | Status: DC
  Administered 2017-04-11 – 2017-04-12 (×3): 60 mg via INTRAVENOUS
  Filled 2017-04-11 (×3): qty 6

## 2017-04-11 MED ORDER — RIVAROXABAN 20 MG PO TABS
20.0000 mg | ORAL_TABLET | Freq: Every day | ORAL | Status: DC
  Administered 2017-04-11 – 2017-04-12 (×2): 20 mg via ORAL
  Filled 2017-04-11 (×2): qty 1

## 2017-04-11 MED ORDER — ATORVASTATIN CALCIUM 40 MG PO TABS
40.0000 mg | ORAL_TABLET | Freq: Every day | ORAL | Status: DC
  Administered 2017-04-11 – 2017-04-12 (×2): 40 mg via ORAL
  Filled 2017-04-11 (×2): qty 1

## 2017-04-11 MED ORDER — SODIUM CHLORIDE 0.9% FLUSH
3.0000 mL | Freq: Two times a day (BID) | INTRAVENOUS | Status: DC
  Administered 2017-04-11 – 2017-04-13 (×3): 3 mL via INTRAVENOUS

## 2017-04-11 MED ORDER — TAMSULOSIN HCL 0.4 MG PO CAPS
0.4000 mg | ORAL_CAPSULE | Freq: Every day | ORAL | Status: DC
  Administered 2017-04-11 – 2017-04-13 (×3): 0.4 mg via ORAL
  Filled 2017-04-11 (×3): qty 1

## 2017-04-11 NOTE — ED Notes (Signed)
Attempted to ambulate with pulse ox but pt was very dizzy and I was not comfortable ambulating due to unsteady balance. Dr. Manus Gunning notified

## 2017-04-11 NOTE — ED Triage Notes (Signed)
Pt c/o sob that has remained constant since being in two days ago,

## 2017-04-11 NOTE — ED Notes (Signed)
CRITICAL VALUE ALERT  Critical Value:  Troponin 0.03 mg/dl  Date & Time Notied:  63/14/97 & 0436 hrs  Provider Notified: Dr. Manus Gunning  Orders Received/Actions taken: N/A

## 2017-04-11 NOTE — ED Provider Notes (Signed)
[facility] EMERGENCY DEPARTMENT Provider Note   CSN: [id number] Arrival date & time: 04/11/17  0205     History   Chief Complaint Chief Complaint  Patient presents with  . Shortness of Breath    HPI [name] is a 51 y.o. male.  Patient well-known to the ED.  History of CHF, noncompliance, A. fib on anticoagulation presenting with worsening shortness of breath over the past 2 days.  He uses 3 L of oxygen at home as needed.  He was seen in the ED 2 days ago and had some IV diuresis and reports compliance with his Bumex tablets.  Endorses worsening shortness of breath and lower extremity edema.  He also believes his weight is up about 2 pounds.  Denies any chest pain, cough or fever.  Patient states this is the same as when he was here 2 days ago and did feel better when he left though he was not able to bend his legs due to swelling.   The history is provided by the patient and the EMS personnel.  Shortness of Breath  Associated symptoms include cough and leg swelling. Pertinent negatives include no fever, no headaches, no chest pain and no abdominal pain.    Past Medical History:  Diagnosis Date  . Allergic rhinitis   . Asthma   . Atrial fibrillation (HCC)   . CHF (congestive heart failure) (HCC)   . Essential hypertension   . Gastroesophageal reflux disease   . Heart attack (HCC)   . History of cardiac catheterization    Normal coronaries December 2016  . Noncompliance    Major problem leading to declining health and recurrent hospitalization  . Nonischemic cardiomyopathy (HCC)    LVEF 20-25%  . On home O2    3L N/C  . OSA (obstructive sleep apnea)   . Osteoarthritis   . Peptic ulcer disease     Patient Active Problem List   Diagnosis Date Noted  . Encounter for hospice care discussion   . Palliative care encounter   . Goals of care, counseling/discussion   . DNR (do not resuscitate)   . DNR (do not resuscitate) discussion   . Chronic congestive heart  failure (HCC) 02/18/2017  . Acute systolic heart failure (HCC) 01/18/2017  . Pedal edema 12/02/2016  . Acute CHF (congestive heart failure) (HCC) 11/09/2016  . Acute on chronic systolic CHF (congestive heart failure) (HCC) 08/19/2016  . CKD (chronic kidney disease), stage II 05/17/2016  . Coronary arteries, normal Dec 2016 01/23/2016  . Chronic anticoagulation-Xarelto 01/23/2016  . NSVT (nonsustained ventricular tachycardia) (HCC) 01/23/2016  . Elevated troponin 12/21/2015  . Nonischemic cardiomyopathy (HCC)   . Morbid obesity due to excess calories (HCC)   . Noncompliance with diet and medication regimen 12/02/2015  . OSA (obstructive sleep apnea) 09/20/2015  . Pulmonary hypertension (HCC) 09/19/2015  . Normocytic anemia 09/19/2015  . Chronic atrial fibrillation (HCC)   . Dyspnea on exertion 05/28/2015  . Acute respiratory failure with hypoxia (HCC) 04/01/2015  . Hypokalemia 04/01/2015  . Obesity hypoventilation syndrome (HCC) 08/08/2014  . GERD (gastroesophageal reflux disease) 08/08/2014  . Edema of right lower extremity 06/21/2014  . Fecal urgency 06/21/2014  . Asthma 02/11/2009  . Hyperlipidemia 07/12/2008  . OBESITY, MORBID 07/12/2008  . Anxiety state 07/12/2008  . DEPRESSION 07/12/2008  . Essential hypertension 07/12/2008  . ALLERGIC RHINITIS 07/12/2008  . Peptic ulcer 07/12/2008  . OSTEOARTHRITIS 07/12/2008    Past Surgical History:  Procedure Laterality Date  . CARDIAC  CATHETERIZATION N/A 06/14/2015   Procedure: Right/Left Heart Cath and Coronary Angiography;  Surgeon: Runell Gess, MD;  Location: [facility] INVASIVE CV LAB;  Service: Cardiovascular;  Laterality: N/A;       Home Medications    Prior to Admission medications   Medication Sig Start Date End Date Taking? Authorizing Provider  albuterol (PROVENTIL) (2.5 MG/3ML) 0.083% nebulizer solution Take 2.5 mg by nebulization every 6 (six) hours as needed for wheezing or shortness of breath.    [provider]  aspirin 81 MG chewable tablet Chew 81 mg by mouth as needed for mild pain or moderate pain.     [provider]  atorvastatin (LIPITOR) 40 MG tablet Take 1 tablet (40 mg total) by mouth daily at 6 PM. 06/25/16   Kathlen Mody, MD  beclomethasone (QVAR) 80 MCG/ACT inhaler Inhale 2 puffs into the lungs 2 (two) times daily.     [provider]  bumetanide (BUMEX) 2 MG tablet Take 1 tablet (2 mg total) by mouth 2 (two) times daily. 11/13/16   Catarina Hartshorn, MD  carvedilol (COREG) 25 MG tablet Take 1 tablet (25 mg total) by mouth 2 (two) times daily with a meal. 11/13/16   Tat, Onalee Hua, MD  docusate sodium (COLACE) 100 MG capsule Take 1 capsule (100 mg total) by mouth 2 (two) times daily. Patient taking differently: Take 100 mg by mouth 2 (two) times daily as needed for mild constipation.  06/25/16   Kathlen Mody, MD  ferrous gluconate (FERGON) 324 MG tablet Take 1 tablet (324 mg total) by mouth 2 (two) times daily with a meal. Patient taking differently: Take 324 mg by mouth daily with breakfast.  06/25/16   Kathlen Mody, MD  metolazone (ZAROXOLYN) 5 MG tablet TAKE ONE TABLET DAILY FOR 3 DAYS THEN STOP 03/25/17   [name], MD  pantoprazole (PROTONIX) 40 MG tablet Take 1 tablet (40 mg total) by mouth daily at 6 (six) AM. Patient taking differently: Take 40 mg by mouth every evening.  06/26/16   Kathlen Mody, MD  potassium chloride (K-DUR,KLOR-CON) 20 MEQ tablet Take 1 tablet (20 mEq total) by mouth daily. 02/20/17   Houston Siren, MD  rivaroxaban (XARELTO) 20 MG TABS tablet Take 1 tablet (20 mg total) by mouth daily with breakfast. Patient taking differently: Take 20 mg by mouth daily with supper.  06/26/16   Kathlen Mody, MD  tamsulosin (FLOMAX) 0.4 MG CAPS capsule Take 1 capsule (0.4 mg total) by mouth daily. 06/25/16   Kathlen Mody, MD    Family History Family History  Problem Relation Age of Onset  . Stroke Father   . Heart attack Father   . Aneurysm Mother         Cerebral aneurysm  . Hypertension Sister   . Colon cancer Neg Hx   . Inflammatory bowel disease Neg Hx   . Liver disease Neg Hx     Social History Social History  Substance Use Topics  . Smoking status: Former Smoker    Packs/day: 0.50    Years: 20.00    Types: Cigarettes    Start date: 04/26/1988    Quit date: 06/23/2007  . Smokeless tobacco: Never Used     Comment: 1 ppd former smoker  . Alcohol use No     Comment: No etoh since 2009     Allergies   Banana; Bee venom; Food; Lipitor [atorvastatin]; Aspirin; Metolazone; Oatmeal; Orange juice [orange oil]; Torsemide; Diltiazem; and Hydralazine   Review of Systems  Review of Systems  Constitutional: Negative for activity change, appetite change and fever.  HENT: Negative for congestion.   Respiratory: Positive for cough, chest tightness and shortness of breath.   Cardiovascular: Positive for leg swelling. Negative for chest pain.  Gastrointestinal: Negative for abdominal pain.  Genitourinary: Negative for dysuria, hematuria and testicular pain.  Neurological: Negative for dizziness, weakness and headaches.    all other systems are negative except as noted in the HPI and PMH.    Physical Exam Updated Vital Signs BP (!) 124/93   Pulse 64   Temp 98.2 F (36.8 C) (Oral)   Resp 20   Ht 6' (1.829 m)   Wt (!) 147.4 kg (325 lb)   SpO2 100%   BMI 44.08 kg/m   Physical Exam  Constitutional: He is oriented to person, place, and time. He appears well-developed and well-nourished. No distress.  Chronically ill appearing. Speaking in short sentences  HENT:  Head: Normocephalic and atraumatic.  Mouth/Throat: Oropharynx is clear and moist. No oropharyngeal exudate.  Eyes: Pupils are equal, round, and reactive to light. Conjunctivae and EOM are normal.  Neck: Normal range of motion. Neck supple.  No meningismus.  Cardiovascular: Normal rate, normal heart sounds and intact distal pulses.   No murmur heard. Irregular rhythm    Pulmonary/Chest: Effort normal and breath sounds normal. No respiratory distress.  Diminished breath sounds at the bases  Abdominal: Soft. There is no tenderness. There is no rebound and no guarding.  Musculoskeletal: Normal range of motion. He exhibits edema. He exhibits no tenderness.  +4 edema to thighs chronically  Neurological: He is alert and oriented to person, place, and time. No cranial nerve deficit. He exhibits normal muscle tone. Coordination normal.   5/5 strength throughout. CN 2-12 intact.Equal grip strength.   Skin: Skin is warm.  Psychiatric: He has a normal mood and affect. His behavior is normal.  Nursing note and vitals reviewed.    ED Treatments / Results  Labs (all labs ordered are listed, but only abnormal results are displayed) Labs Reviewed  CBC WITH DIFFERENTIAL/PLATELET - Abnormal; Notable for the following:       Result Value   Hemoglobin 11.1 (*)    HCT 35.4 (*)    MCH 25.9 (*)    RDW 16.9 (*)    All other components within normal limits  BRAIN NATRIURETIC PEPTIDE - Abnormal; Notable for the following:    B Natriuretic Peptide 628.0 (*)    All other components within normal limits  URINALYSIS, ROUTINE W REFLEX MICROSCOPIC - Abnormal; Notable for the following:    Color, Urine STRAW (*)    All other components within normal limits  BLOOD GAS, ARTERIAL - Abnormal; Notable for the following:    pO2, Arterial 110 (*)    All other components within normal limits  RAPID URINE DRUG SCREEN, HOSP PERFORMED  BASIC METABOLIC PANEL  TROPONIN I    EKG  EKG Interpretation  Date/Time:  Sunday April 11 2017 02:10:43 EDT Ventricular Rate:  96 PR Interval:    QRS Duration: 86 QT Interval:  368 QTC Calculation: 465 R Axis:   57 Text Interpretation:  Atrial fibrillation Ventricular bigeminy Nonspecific T abnormalities, lateral leads No significant change was found Confirmed by Glynn Octave [phone]) on 04/11/2017 2:22:09 AM       Radiology Dg Chest  2 View  Result Date: 04/11/2017 CLINICAL DATA:  Shortness of breath beginning 2 days ago. EXAM: CHEST  2 VIEW COMPARISON:  04/09/2017 FINDINGS: Cardiac  enlargement. Pulmonary vascularity is normal. No airspace disease or consolidation in the lungs. No blunting of costophrenic angles. No pneumothorax. Mediastinal contours appear intact. IMPRESSION: Cardiac enlargement.  No evidence of active pulmonary disease. Electronically Signed   By: Burman Nieves M.D.   On: 04/11/2017 02:45   Dg Chest Port 1 View  Result Date: 04/09/2017 CLINICAL DATA:  51 y/o  M; chest pain and shortness of breath. EXAM: PORTABLE CHEST 1 VIEW COMPARISON:  03/09/2017 chest radiograph FINDINGS: Stable cardiomegaly given projection and technique. Reticular and hazy opacities of the lungs are increased from prior study. No pleural effusion or pneumothorax. Bones are unremarkable. IMPRESSION: Stable cardiomegaly. Interstitial and mild alveolar pulmonary edema. Electronically Signed   By: Mitzi Hansen M.D.   On: 04/09/2017 04:20    Procedures Procedures (including critical care time)  Medications Ordered in ED Medications  furosemide (LASIX) injection 80 mg (80 mg Intravenous Given 04/11/17 0249)     Initial Impression / Assessment and Plan / ED Course  I have reviewed the triage vital signs and the nursing notes.  Pertinent labs & imaging results that were available during my care of the patient were reviewed by me and considered in my medical decision making (see chart for details).    Patient with SOB, well known to the ED with noncompliance with CHF regimen.   IV lasix given. CXR stable cardiomegaly.   ABG without CO2 retention.  UDS negative. Minimal troponin elevation similar to previous. Clean coronaries on [facility] 12/16. Creatinine at baseline.  Patient has urinated. 1.5 L.  He is not dyspneic on his home oxygen.  Patient has diuresed >2L.  Remains dyspneic and feels like he cannot walk at his  baseline level. Will plan admission for further diuresis. D/w Dr. Kerry Hough.   Final Clinical Impressions(s) / ED Diagnoses   Final diagnoses:  Acute on chronic combined systolic and diastolic congestive heart failure [facility])    New Prescriptions New Prescriptions   No medications on file     Glynn Octave, MD 04/11/17 [phone]

## 2017-04-11 NOTE — H&P (Signed)
History and Physical    [name]:[id number] DOB: Nov 11, 1965 DOA: 04/11/2017  PCP: Pearson Grippe, MD  Patient coming from: home  I have personally briefly reviewed patient's old medical records in [facility]  Chief Complaint: shortness of breath  HPI: [name] is a 51 y.o. male with medical history significant of atrial fibrillation, chronic systolic congestive heart failure with an ejection fraction of 35-40%, chronic respiratory failure on 3 L of oxygen and a long history of medication/dietary noncompliance with frequent ER/hospital stays.  Patient presents to the hospital today with 2-3 days of worsening shortness of breath.  He has dyspnea on exertion.  Unable to lie down flat.  He has noted worsening lower extremity edema.  Describes a tightness in his chest.  Worse when laying down.  No wheezing.  No cough or fever.  No vomiting or diarrhea.  ED Course: Patient was noted to be short of breath and somewhat tachypneic.  EKG showed atrial fibrillation.  Cardiac enzymes were unremarkable.  Chemistry panel and CBC were also unrevealing chest x-ray showed cardiomegaly without any clear pulmonary edema.  He did receive a dose of IV Lasix and had approximately 2 L of urine output.  He is still sore breath and is unable to ambulate which is worse than his baseline.  He has been referred for admission for further diuresis.  Review of Systems: As per HPI otherwise 10 point review of systems negative.    Past Medical History:  Diagnosis Date  . Allergic rhinitis   . Asthma   . Atrial fibrillation (HCC)   . CHF (congestive heart failure) (HCC)   . Essential hypertension   . Gastroesophageal reflux disease   . Heart attack (HCC)   . History of cardiac catheterization    Normal coronaries December 2016  . Noncompliance    Major problem leading to declining health and recurrent hospitalization  . Nonischemic cardiomyopathy (HCC)    LVEF 20-25%  . On home O2    3L N/C  . OSA  (obstructive sleep apnea)   . Osteoarthritis   . Peptic ulcer disease     Past Surgical History:  Procedure Laterality Date  . CARDIAC CATHETERIZATION N/A 06/14/2015   Procedure: Right/Left Heart Cath and Coronary Angiography;  Surgeon: Runell Gess, MD;  Location: [facility] INVASIVE CV LAB;  Service: Cardiovascular;  Laterality: N/A;     reports that he quit smoking about 9 years ago. His smoking use included Cigarettes. He started smoking about 28 years ago. He has a 10.00 pack-year smoking history. He has never used smokeless tobacco. He reports that he does not drink alcohol or use drugs.  Allergies  Allergen Reactions  . Banana Shortness Of Breath  . Bee Venom Shortness Of Breath, Swelling and Other (See Comments)    Reaction:  Facial swelling  . Food Shortness Of Breath, Rash and Other (See Comments)    Pt states that he is allergic to strawberries.    . Lipitor [Atorvastatin] Shortness Of Breath and Other (See Comments)    Reaction:  Nose bleeds   . Aspirin Other (See Comments)    Reaction:  GI upset   . Metolazone Other (See Comments)    Pt states that he stopped taking this med due to heart attack like symptoms.   . Oatmeal Nausea And Vomiting  . Orange Juice [Orange Oil] Nausea And Vomiting    All acidic products make him nauseous and upset stomach  . Torsemide Swelling and  Other (See Comments)    Reaction:  Swelling of feet/legs   . Diltiazem Palpitations  . Hydralazine Palpitations    Family History  Problem Relation Age of Onset  . Stroke Father   . Heart attack Father   . Aneurysm Mother        Cerebral aneurysm  . Hypertension Sister   . Colon cancer Neg Hx   . Inflammatory bowel disease Neg Hx   . Liver disease Neg Hx     Prior to Admission medications   Medication Sig Start Date End Date Taking? Authorizing Provider  albuterol (PROVENTIL) (2.5 MG/3ML) 0.083% nebulizer solution Take 2.5 mg by nebulization every 6 (six) hours as needed for wheezing or  shortness of breath.    [provider]  aspirin 81 MG chewable tablet Chew 81 mg by mouth as needed for mild pain or moderate pain.     [provider]  atorvastatin (LIPITOR) 40 MG tablet Take 1 tablet (40 mg total) by mouth daily at 6 PM. 06/25/16   Kathlen Mody, MD  beclomethasone (QVAR) 80 MCG/ACT inhaler Inhale 2 puffs into the lungs 2 (two) times daily.     [provider]  bumetanide (BUMEX) 2 MG tablet Take 1 tablet (2 mg total) by mouth 2 (two) times daily. 11/13/16   Catarina Hartshorn, MD  carvedilol (COREG) 25 MG tablet Take 1 tablet (25 mg total) by mouth 2 (two) times daily with a meal. 11/13/16   Tat, Onalee Hua, MD  docusate sodium (COLACE) 100 MG capsule Take 1 capsule (100 mg total) by mouth 2 (two) times daily. Patient taking differently: Take 100 mg by mouth 2 (two) times daily as needed for mild constipation.  06/25/16   Kathlen Mody, MD  ferrous gluconate (FERGON) 324 MG tablet Take 1 tablet (324 mg total) by mouth 2 (two) times daily with a meal. Patient taking differently: Take 324 mg by mouth daily with breakfast.  06/25/16   Kathlen Mody, MD  metolazone (ZAROXOLYN) 5 MG tablet TAKE ONE TABLET DAILY FOR 3 DAYS THEN STOP 03/25/17   [name], MD  pantoprazole (PROTONIX) 40 MG tablet Take 1 tablet (40 mg total) by mouth daily at 6 (six) AM. Patient taking differently: Take 40 mg by mouth every evening.  06/26/16   Kathlen Mody, MD  potassium chloride (K-DUR,KLOR-CON) 20 MEQ tablet Take 1 tablet (20 mEq total) by mouth daily. 02/20/17   Houston Siren, MD  rivaroxaban (XARELTO) 20 MG TABS tablet Take 1 tablet (20 mg total) by mouth daily with breakfast. Patient taking differently: Take 20 mg by mouth daily with supper.  06/26/16   Kathlen Mody, MD  tamsulosin (FLOMAX) 0.4 MG CAPS capsule Take 1 capsule (0.4 mg total) by mouth daily. 06/25/16   Kathlen Mody, MD    Physical Exam: Vitals:   04/11/17 0400 04/11/17 0430 04/11/17 0630 04/11/17 0745  BP: (!) 159/124 (!)  144/113 (!) 155/127   Pulse: (!) 50 (!) 59 (!) 147   Resp:  (!) 24 (!) 24   Temp:      TempSrc:      SpO2: 100% 100% 100% 100%  Weight:      Height:        Constitutional: NAD, calm, comfortable Vitals:   04/11/17 0400 04/11/17 0430 04/11/17 0630 04/11/17 0745  BP: (!) 159/124 (!) 144/113 (!) 155/127   Pulse: (!) 50 (!) 59 (!) 147   Resp:  (!) 24 (!) 24   Temp:  TempSrc:      SpO2: 100% 100% 100% 100%  Weight:      Height:       Eyes: PERRL, lids and conjunctivae normal ENMT: Mucous membranes are moist. Posterior pharynx clear of any exudate or lesions.Normal dentition.  Neck: normal, supple, no masses, no thyromegaly Respiratory: crackles at bases. Mild increased respiratory effort. No accessory muscle use.  Cardiovascular: irregular, no murmurs / rubs / gallops. 2+ extremity edema. 2+ pedal pulses. No carotid bruits.  Abdomen: no tenderness, no masses palpated. No hepatosplenomegaly. Bowel sounds positive.  Musculoskeletal: no clubbing / cyanosis. No joint deformity upper and lower extremities. Good ROM, no contractures. Normal muscle tone.  Skin: no rashes, lesions, ulcers. No induration Neurologic: CN 2-12 grossly intact. Sensation intact, DTR normal. Strength 5/5 in all 4.  Psychiatric: Normal judgment and insight. Alert and oriented x 3. Normal mood.   Labs on Admission: I have personally reviewed following labs and imaging studies  CBC:  Recent Labs Lab 04/09/17 0344 04/11/17 0312  WBC 3.9* 4.0  NEUTROABS  --  2.6  HGB 11.4* 11.1*  HCT 37.1* 35.4*  MCV 84.3 82.7  PLT 214 235   Basic Metabolic Panel:  Recent Labs Lab 04/09/17 0344 04/11/17 0312  NA 139 143  K 3.2* 3.4*  CL 103 106  CO2 27 28  GLUCOSE 94 93  BUN 17 19  CREATININE 1.18 1.20  CALCIUM 9.0 9.0   GFR: Estimated Creatinine Clearance: 112.3 mL/min (by C-G formula based on SCr of 1.2 mg/dL). Liver Function Tests: No results for input(s): AST, ALT, ALKPHOS, BILITOT, PROT, ALBUMIN in  the last 168 hours. No results for input(s): LIPASE, AMYLASE in the last 168 hours. No results for input(s): AMMONIA in the last 168 hours. Coagulation Profile: No results for input(s): INR, PROTIME in the last 168 hours. Cardiac Enzymes:  Recent Labs Lab 04/11/17 0312  TROPONINI 0.03*   BNP (last 3 results) No results for input(s): PROBNP in the last 8760 hours. HbA1C: No results for input(s): HGBA1C in the last 72 hours. CBG: No results for input(s): GLUCAP in the last 168 hours. Lipid Profile: No results for input(s): CHOL, HDL, LDLCALC, TRIG, CHOLHDL, LDLDIRECT in the last 72 hours. Thyroid Function Tests: No results for input(s): TSH, T4TOTAL, FREET4, T3FREE, THYROIDAB in the last 72 hours. Anemia Panel: No results for input(s): VITAMINB12, FOLATE, FERRITIN, TIBC, IRON, RETICCTPCT in the last 72 hours. Urine analysis:    Component Value Date/Time   COLORURINE STRAW (A) 04/11/2017 0215   APPEARANCEUR CLEAR 04/11/2017 0215   LABSPEC 1.005 04/11/2017 0215   PHURINE 7.0 04/11/2017 0215   GLUCOSEU NEGATIVE 04/11/2017 0215   GLUCOSEU NEG mg/dL 96/29/528408/10/2009 [id number]   HGBUR NEGATIVE 04/11/2017 0215   BILIRUBINUR NEGATIVE 04/11/2017 0215   KETONESUR NEGATIVE 04/11/2017 0215   PROTEINUR NEGATIVE 04/11/2017 0215   UROBILINOGEN 0.2 08/08/2014 1445   NITRITE NEGATIVE 04/11/2017 0215   LEUKOCYTESUR NEGATIVE 04/11/2017 0215    Radiological Exams on Admission: Dg Chest 2 View  Result Date: 04/11/2017 CLINICAL DATA:  Shortness of breath beginning 2 days ago. EXAM: CHEST  2 VIEW COMPARISON:  04/09/2017 FINDINGS: Cardiac enlargement. Pulmonary vascularity is normal. No airspace disease or consolidation in the lungs. No blunting of costophrenic angles. No pneumothorax. Mediastinal contours appear intact. IMPRESSION: Cardiac enlargement.  No evidence of active pulmonary disease. Electronically Signed   By: Burman NievesWilliam  Stevens M.D.   On: 04/11/2017 02:45    EKG: Independently reviewed. Atrial  fibrillation  Assessment/Plan Active Problems:  Hyperlipidemia   Essential hypertension   Asthma   GERD (gastroesophageal reflux disease)   Chronic atrial fibrillation (HCC)   Morbid obesity due to excess calories (HCC)   Acute on chronic systolic CHF (congestive heart failure) (HCC)   CHF exacerbation (HCC)   Chronic respiratory failure with hypoxia (HCC)    1. Acute on chronic systolic congestive heart failure.  Ejection fraction 35-40%.  Start the patient on IV Lasix.  Monitor intake and output.  Continue on Coreg.  Start on lisinopril.  Patient was counseled on the importance of dietary/medication compliance. 2. Chronic atrial fibrillation.  Rate is controlled.  Continue on Coreg.  Anticoagulated with Xarelto. 3. Chronic respiratory failure.  He is chronically on 3 L.  Currently on 4 L at 100%.  Wean down oxygen back to baseline. 4. Hypertension.  Blood pressure is elevated.  Anticipate improving blood pressures with diuresis.  Continue home antihypertensive regimen. 5. GERD.  Continue on PPI 6. Hyperlipidemia.  Continue on statin 7. Asthma.  Continue on inhaled steroids and short acting bronchodilators as needed. 8. Morbid obesity  DVT prophylaxis: xarelto Code Status: DNR Family Communication: no family present Disposition Plan: discharge home once improved Consults called:  Admission status: inpatient, telemetry   MEMON,JEHANZEB MD [facility] Pager [phone]  If 7PM-7AM, please contact night-coverage [email or web address] Password [email or web address]  04/11/2017, 8:45 AM

## 2017-04-12 LAB — BASIC METABOLIC PANEL
ANION GAP: 8 (ref 5–15)
BUN: 20 mg/dL (ref 6–20)
CALCIUM: 8.3 mg/dL — AB (ref 8.9–10.3)
CO2: 29 mmol/L (ref 22–32)
Chloride: 105 mmol/L (ref 101–111)
Creatinine, Ser: 1.15 mg/dL (ref 0.61–1.24)
GLUCOSE: 98 mg/dL (ref 65–99)
POTASSIUM: 3.1 mmol/L — AB (ref 3.5–5.1)
SODIUM: 142 mmol/L (ref 135–145)

## 2017-04-12 MED ORDER — GI COCKTAIL ~~LOC~~
30.0000 mL | Freq: Three times a day (TID) | ORAL | Status: DC | PRN
  Administered 2017-04-12: 30 mL via ORAL
  Filled 2017-04-12: qty 30

## 2017-04-12 MED ORDER — POTASSIUM CHLORIDE CRYS ER 20 MEQ PO TBCR
40.0000 meq | EXTENDED_RELEASE_TABLET | ORAL | Status: AC
  Administered 2017-04-12 (×3): 40 meq via ORAL
  Filled 2017-04-12 (×3): qty 2

## 2017-04-12 NOTE — Progress Notes (Signed)
PROGRESS NOTE    Samuel KlippelMark T Fitzpatrick  ZOX:[id number] DOB: 07/07/1965 DOA: 04/11/2017 PCP: [name], [name], MD    Brief Narrative:  51 year old male with a history of chronic systolic congestive heart failure and noncompliance, who was admitted to the hospital with decompensated CHF.  He has been started on intravenous diuresis.  Anticipate discharge in the next 24 hours.   Assessment & Plan:   Active Problems:   Hyperlipidemia   Essential hypertension   Asthma   GERD (gastroesophageal reflux disease)   Chronic atrial fibrillation (HCC)   Morbid obesity due to excess calories (HCC)   Acute on chronic systolic CHF (congestive heart failure) (HCC)   CHF exacerbation (HCC)   Chronic respiratory failure with hypoxia (HCC)   1. Acute on chronic systolic congestive heart failure.  Ejection fraction 35-40%.    Appears to have good urine output on IV Lasix.  Volume status is -5.1 L since admission.  Monitor intake and output.  Continue on Coreg and lisinopril.  Patient was counseled on the importance of dietary/medication compliance.  He still has some evidence of volume overload.  Continue current treatments. 2. Chronic atrial fibrillation.  Rate is controlled.  Continue on Coreg.  Anticoagulated with Xarelto. 3. Chronic respiratory failure.  He is chronically on 3 L as needed.  Currently appears to be breathing on room air comfortably. 4. Hypertension.    Blood pressures are stable.  Continue home antihypertensive regimen. 5. GERD.  Continue on PPI 6. Hyperlipidemia.  Continue on statin 7. Asthma.  Continue on inhaled steroids and short acting bronchodilators as needed. 8. Morbid obesity   DVT prophylaxis: Xarelto Code Status: DNR Family Communication: Discussed with patient Disposition Plan: Discharge home once improved  Consultants:     Procedures:     Antimicrobials:      Subjective: Starting to feel better.  Shortness of breath improving.  Objective: Vitals:   04/12/17  0646 04/12/17 0650 04/12/17 0902 04/12/17 1318  BP: 112/67   117/82  Pulse: 79   (!) 47  Resp: 17   20  Temp: 98.2 F (36.8 C)   98 F (36.7 C)  TempSrc:    Oral  SpO2: 96%  97% 100%  Weight:  (!) 154.1 kg (339 lb 12.8 oz)    Height:        Intake/Output Summary (Last 24 hours) at 04/12/17 1750 Last data filed at 04/12/17 1600  Gross per 24 hour  Intake             1403 ml  Output             3900 ml  Net            -2497 ml   Filed Weights   04/11/17 0209 04/11/17 1555 04/12/17 0650  Weight: (!) 147.4 kg (325 lb) (!) 156.2 kg (344 lb 6.4 oz) (!) 154.1 kg (339 lb 12.8 oz)    Examination:  General exam: Appears calm and comfortable  Respiratory system: Crackles at bases. Respiratory effort normal. Cardiovascular system: S1 & S2 heard, RRR. No JVD, murmurs, rubs, gallops or clicks. 1+ pedal edema. Gastrointestinal system: Abdomen is nondistended, soft and nontender. No organomegaly or masses felt. Normal bowel sounds heard. Central nervous system: Alert and oriented. No focal neurological deficits. Extremities: Symmetric 5 x 5 power. Skin: No rashes, lesions or ulcers Psychiatry: Judgement and insight appear normal. Mood & affect appropriate.     Data Reviewed: I have personally reviewed following labs and imaging studies  CBC:  Recent Labs Lab 04/09/17 0344 04/11/17 0312  WBC 3.9* 4.0  NEUTROABS  --  2.6  HGB 11.4* 11.1*  HCT 37.1* 35.4*  MCV 84.3 82.7  PLT 214 235   Basic Metabolic Panel:  Recent Labs Lab 04/09/17 0344 04/11/17 0312 04/12/17 0629  NA 139 143 142  K 3.2* 3.4* 3.1*  CL 103 106 105  CO2 27 28 29   GLUCOSE 94 93 98  BUN 17 19 20   CREATININE 1.18 1.20 1.15  CALCIUM 9.0 9.0 8.3*   GFR: Estimated Creatinine Clearance: 120.2 mL/min (by C-G formula based on SCr of 1.15 mg/dL). Liver Function Tests: No results for input(s): AST, ALT, ALKPHOS, BILITOT, PROT, ALBUMIN in the last 168 hours. No results for input(s): LIPASE, AMYLASE in the  last 168 hours. No results for input(s): AMMONIA in the last 168 hours. Coagulation Profile: No results for input(s): INR, PROTIME in the last 168 hours. Cardiac Enzymes:  Recent Labs Lab 04/11/17 0312  TROPONINI 0.03*   BNP (last 3 results) No results for input(s): PROBNP in the last 8760 hours. HbA1C: No results for input(s): HGBA1C in the last 72 hours. CBG: No results for input(s): GLUCAP in the last 168 hours. Lipid Profile: No results for input(s): CHOL, HDL, LDLCALC, TRIG, CHOLHDL, LDLDIRECT in the last 72 hours. Thyroid Function Tests: No results for input(s): TSH, T4TOTAL, FREET4, T3FREE, THYROIDAB in the last 72 hours. Anemia Panel: No results for input(s): VITAMINB12, FOLATE, FERRITIN, TIBC, IRON, RETICCTPCT in the last 72 hours. Sepsis Labs: No results for input(s): PROCALCITON, LATICACIDVEN in the last 168 hours.  No results found for this or any previous visit (from the past 240 hour(s)).       Radiology Studies: Dg Chest 2 View  Result Date: 04/11/2017 CLINICAL DATA:  Shortness of breath beginning 2 days ago. EXAM: CHEST  2 VIEW COMPARISON:  04/09/2017 FINDINGS: Cardiac enlargement. Pulmonary vascularity is normal. No airspace disease or consolidation in the lungs. No blunting of costophrenic angles. No pneumothorax. Mediastinal contours appear intact. IMPRESSION: Cardiac enlargement.  No evidence of active pulmonary disease. Electronically Signed   By: Burman Nieves M.D.   On: 04/11/2017 02:45        Scheduled Meds: . aspirin  81 mg Oral Daily  . atorvastatin  40 mg Oral q1800  . budesonide (PULMICORT) nebulizer solution  0.25 mg Nebulization BID  . carvedilol  25 mg Oral BID WC  . furosemide  60 mg Intravenous BID  . lisinopril  5 mg Oral Daily  . pantoprazole  40 mg Oral QPM  . potassium chloride SA  20 mEq Oral Daily  . rivaroxaban  20 mg Oral Q supper  . sodium chloride flush  3 mL Intravenous Q12H  . tamsulosin  0.4 mg Oral Daily    Continuous Infusions: . sodium chloride       LOS: 1 day    Time spent:    Samuel Amstutz, MD [facility] Pager [phone]  If 7PM-7AM, please contact night-coverage [email or web address] Password [email or web address] 04/12/2017, 5:50 PM

## 2017-04-12 NOTE — Care Management Note (Signed)
Case Management Note  Patient Details  Name: OVED PALMBERG MRN: [id number] Date of Birth: 01/19/1966  Subjective/Objective:                 Admitted with CHF. Pt lives with mother, non compliant with medications and diet, counseled on each admission. Pt has home oxygen, is ind with ADL's. He has transportation to appointments.  Action/Plan: He will return home with self care at DC. HH/community resources have been referred in the past but not an option at this time due to his non-compliance. Cm will follow peripherally to discharge.   Expected Discharge Date:     04/13/2017             Expected Discharge Plan:  Home/Self Care  In-House Referral:  NA  Discharge planning Services  CM Consult  Post Acute Care Choice:  NA Choice offered to:  NA  Status of Service:  Completed, signed off  [name], RN 04/12/2017, 3:29 PM

## 2017-04-13 LAB — BASIC METABOLIC PANEL
Anion gap: 9 (ref 5–15)
BUN: 23 mg/dL — AB (ref 6–20)
CHLORIDE: 104 mmol/L (ref 101–111)
CO2: 27 mmol/L (ref 22–32)
CREATININE: 1.37 mg/dL — AB (ref 0.61–1.24)
Calcium: 8.3 mg/dL — ABNORMAL LOW (ref 8.9–10.3)
GFR calc Af Amer: >60 mL/min (ref >60)
GFR calc non Af Amer: 60 mL/min — ABNORMAL LOW (ref >60)
GLUCOSE: 89 mg/dL (ref 65–99)
Potassium: 3.7 mmol/L (ref 3.5–5.1)
SODIUM: 140 mmol/L (ref 135–145)

## 2017-04-13 MED ORDER — ASPIRIN 81 MG PO CHEW: 81.0000 mg | CHEWABLE_TABLET | Freq: Every day | ORAL | Status: DC

## 2017-04-13 MED ORDER — LISINOPRIL 5 MG PO TABS: 5.0000 mg | ORAL_TABLET | Freq: Every day | ORAL | 0 refills | Status: DC

## 2017-04-13 NOTE — Discharge Summary (Signed)
Physician Discharge Summary  [name]:[id number] DOB: 10-13-1965 DOA: 04/11/2017  PCP: Pearson Grippe, MD  Admit date: 04/11/2017 Discharge date: 04/13/2017  Admitted From: home Disposition:  home  Recommendations for Outpatient Follow-up:  1. Follow up with PCP in 1-2 weeks 2. Please obtain BMP/CBC in one week 3. Please follow up on the following pending results:  Home Health: Equipment/Devices:  Discharge Condition: stable CODE STATUS: DNR Diet recommendation: Heart Healthy   Brief/Interim Summary: 51 year old male with a history of chronic systolic congestive heart failure and noncompliance, who was admitted to the hospital with decompensated CHF.  He was started on intravenous diuresis.  Discharge Diagnoses:  Active Problems:   Hyperlipidemia   Essential hypertension   Asthma   GERD (gastroesophageal reflux disease)   Chronic atrial fibrillation (HCC)   Morbid obesity due to excess calories (HCC)   Acute on chronic systolic CHF (congestive heart failure) (HCC)   CHF exacerbation (HCC)   Chronic respiratory failure with hypoxia (HCC)  1. Acute on chronic systolic congestive heart failure. Ejection fraction 35-40%.  Volume status is -7.2 L since admission. Patient was treated with IV lasix. His creatinine has started to bump up and volume status appears to be improved. Will transition back to bumex. Monitor intake and output. Continue on Coreg and lisinopril. Patient was counseled on the importance of dietary/medication compliance.   2. Chronic atrial fibrillation. Rate is controlled. Continue on Coreg. Anticoagulated with Xarelto. 3. Chronic respiratory failure. He is chronically on 3 L as needed.  Currently appears to be breathing on room air comfortably. 4. Hypertension.   Blood pressures are stable.  Continue home antihypertensive regimen. 5. GERD. Continue on PPI 6. Hyperlipidemia. Continue on statin 7. Asthma. Continue on inhaled steroids and short  acting bronchodilators as needed. 8. Morbid obesity  Discharge Instructions  Discharge Instructions    Diet - low sodium heart healthy    Complete by:  As directed    Increase activity slowly    Complete by:  As directed      Allergies as of 04/13/2017      Reactions   Banana Shortness Of Breath   Bee Venom Shortness Of Breath, Swelling, Other (See Comments)   Reaction:  Facial swelling   Food Shortness Of Breath, Rash, Other (See Comments)   Pt states that he is allergic to strawberries.     Lipitor [atorvastatin] Shortness Of Breath, Other (See Comments)   Reaction:  Nose bleeds    Aspirin Other (See Comments)   Reaction:  GI upset    Metolazone Other (See Comments)   Pt states that he stopped taking this med due to heart attack like symptoms.    Oatmeal Nausea And Vomiting   Orange Juice [orange Oil] Nausea And Vomiting   All acidic products make him nauseous and upset stomach   Torsemide Swelling, Other (See Comments)   Reaction:  Swelling of feet/legs    Diltiazem Palpitations   Hydralazine Palpitations      Medication List    STOP taking these medications   metolazone 5 MG tablet Commonly known as:  ZAROXOLYN     TAKE these medications   albuterol (2.5 MG/3ML) 0.083% nebulizer solution Commonly known as:  PROVENTIL Take 2.5 mg by nebulization every 6 (six) hours as needed for wheezing or shortness of breath.   aspirin 81 MG chewable tablet Chew 1 tablet (81 mg total) by mouth daily. What changed:  when to take this  reasons to take this  atorvastatin 40 MG tablet Commonly known as:  LIPITOR Take 1 tablet (40 mg total) by mouth daily at 6 PM.   beclomethasone 80 MCG/ACT inhaler Commonly known as:  QVAR Inhale 2 puffs into the lungs 2 (two) times daily.   bumetanide 2 MG tablet Commonly known as:  BUMEX Take 1 tablet (2 mg total) by mouth 2 (two) times daily.   carvedilol 25 MG tablet Commonly known as:  COREG Take 1 tablet (25 mg total) by  mouth 2 (two) times daily with a meal.   docusate sodium 100 MG capsule Commonly known as:  COLACE Take 1 capsule (100 mg total) by mouth 2 (two) times daily. What changed:  when to take this  reasons to take this   ferrous gluconate 324 MG tablet Commonly known as:  FERGON Take 1 tablet (324 mg total) by mouth 2 (two) times daily with a meal. What changed:  when to take this   lisinopril 5 MG tablet Commonly known as:  PRINIVIL,ZESTRIL Take 1 tablet (5 mg total) by mouth daily.   pantoprazole 40 MG tablet Commonly known as:  PROTONIX Take 1 tablet (40 mg total) by mouth daily at 6 (six) AM. What changed:  when to take this   potassium chloride SA 20 MEQ tablet Commonly known as:  K-DUR,KLOR-CON Take 1 tablet (20 mEq total) by mouth daily.   rivaroxaban 20 MG Tabs tablet Commonly known as:  XARELTO Take 1 tablet (20 mg total) by mouth daily with breakfast. What changed:  when to take this   tamsulosin 0.4 MG Caps capsule Commonly known as:  FLOMAX Take 1 capsule (0.4 mg total) by mouth daily.       Allergies  Allergen Reactions  . Banana Shortness Of Breath  . Bee Venom Shortness Of Breath, Swelling and Other (See Comments)    Reaction:  Facial swelling  . Food Shortness Of Breath, Rash and Other (See Comments)    Pt states that he is allergic to strawberries.    . Lipitor [Atorvastatin] Shortness Of Breath and Other (See Comments)    Reaction:  Nose bleeds   . Aspirin Other (See Comments)    Reaction:  GI upset   . Metolazone Other (See Comments)    Pt states that he stopped taking this med due to heart attack like symptoms.   . Oatmeal Nausea And Vomiting  . Orange Juice [Orange Oil] Nausea And Vomiting    All acidic products make him nauseous and upset stomach  . Torsemide Swelling and Other (See Comments)    Reaction:  Swelling of feet/legs   . Diltiazem Palpitations  . Hydralazine Palpitations    Consultations:     Procedures/Studies: Dg Chest  2 View  Result Date: 04/11/2017 CLINICAL DATA:  Shortness of breath beginning 2 days ago. EXAM: CHEST  2 VIEW COMPARISON:  04/09/2017 FINDINGS: Cardiac enlargement. Pulmonary vascularity is normal. No airspace disease or consolidation in the lungs. No blunting of costophrenic angles. No pneumothorax. Mediastinal contours appear intact. IMPRESSION: Cardiac enlargement.  No evidence of active pulmonary disease. Electronically Signed   By: Burman Nieves M.D.   On: 04/11/2017 02:45   Dg Chest Port 1 View  Result Date: 04/09/2017 CLINICAL DATA:  51 y/o  M; chest pain and shortness of breath. EXAM: PORTABLE CHEST 1 VIEW COMPARISON:  03/09/2017 chest radiograph FINDINGS: Stable cardiomegaly given projection and technique. Reticular and hazy opacities of the lungs are increased from prior study. No pleural effusion or pneumothorax. Bones are  unremarkable. IMPRESSION: Stable cardiomegaly. Interstitial and mild alveolar pulmonary edema. Electronically Signed   By: Mitzi Hansen M.D.   On: 04/09/2017 04:20       Subjective: Shortness of breath is better  Discharge Exam: Vitals:   04/13/17 0300 04/13/17 1026  BP: 127/86   Pulse: 82   Resp: 16   Temp: 98.1 F (36.7 C)   SpO2: 100% 99%   Vitals:   04/12/17 2257 04/13/17 0300 04/13/17 0500 04/13/17 1026  BP: 123/89 127/86    Pulse: 79 82    Resp: 18 16    Temp: (!) 97.4 F (36.3 C) 98.1 F (36.7 C)    TempSrc: Oral Oral    SpO2: 95% 100%  99%  Weight:  (!) 152.8 kg (336 lb 14.4 oz) (!) 152.8 kg (336 lb 14.4 oz)   Height:        General: Pt is alert, awake, not in acute distress Cardiovascular: RRR, S1/S2 +, no rubs, no gallops Respiratory: CTA bilaterally, no wheezing, no rhonchi Abdominal: Soft, NT, ND, bowel sounds + Extremities: no edema, no cyanosis    The results of significant diagnostics from this hospitalization (including imaging, microbiology, ancillary and laboratory) are listed below for reference.      Microbiology: No results found for this or any previous visit (from the past 240 hour(s)).   Labs: BNP (last 3 results)  Recent Labs  03/01/17 0705 03/02/17 1338 04/11/17 0313  BNP 617.0* 514.0* 628.0*   Basic Metabolic Panel:  Recent Labs Lab 04/09/17 0344 04/11/17 0312 04/12/17 0629 04/13/17 0613  NA 139 143 142 140  K 3.2* 3.4* 3.1* 3.7  CL 103 106 105 104  CO2 27 28 29 27   GLUCOSE 94 93 98 89  BUN 17 19 20  23*  CREATININE 1.18 1.20 1.15 1.37*  CALCIUM 9.0 9.0 8.3* 8.3*   Liver Function Tests: No results for input(s): AST, ALT, ALKPHOS, BILITOT, PROT, ALBUMIN in the last 168 hours. No results for input(s): LIPASE, AMYLASE in the last 168 hours. No results for input(s): AMMONIA in the last 168 hours. CBC:  Recent Labs Lab 04/09/17 0344 04/11/17 0312  WBC 3.9* 4.0  NEUTROABS  --  2.6  HGB 11.4* 11.1*  HCT 37.1* 35.4*  MCV 84.3 82.7  PLT 214 235   Cardiac Enzymes:  Recent Labs Lab 04/11/17 0312  TROPONINI 0.03*   BNP: Invalid input(s): POCBNP CBG: No results for input(s): GLUCAP in the last 168 hours. D-Dimer No results for input(s): DDIMER in the last 72 hours. Hgb A1c No results for input(s): HGBA1C in the last 72 hours. Lipid Profile No results for input(s): CHOL, HDL, LDLCALC, TRIG, CHOLHDL, LDLDIRECT in the last 72 hours. Thyroid function studies No results for input(s): TSH, T4TOTAL, T3FREE, THYROIDAB in the last 72 hours.  Invalid input(s): FREET3 Anemia work up No results for input(s): VITAMINB12, FOLATE, FERRITIN, TIBC, IRON, RETICCTPCT in the last 72 hours. Urinalysis    Component Value Date/Time   COLORURINE STRAW (A) 04/11/2017 0215   APPEARANCEUR CLEAR 04/11/2017 0215   LABSPEC 1.005 04/11/2017 0215   PHURINE 7.0 04/11/2017 0215   GLUCOSEU NEGATIVE 04/11/2017 0215   GLUCOSEU NEG mg/dL 65/79/0383 3383   HGBUR NEGATIVE 04/11/2017 0215   BILIRUBINUR NEGATIVE 04/11/2017 0215   KETONESUR NEGATIVE 04/11/2017 0215    PROTEINUR NEGATIVE 04/11/2017 0215   UROBILINOGEN 0.2 08/08/2014 1445   NITRITE NEGATIVE 04/11/2017 0215   LEUKOCYTESUR NEGATIVE 04/11/2017 0215   Sepsis Labs Invalid input(s): PROCALCITONIN,  WBC,  LACTICIDVEN  Microbiology No results found for this or any previous visit (from the past 240 hour(s)).   Time coordinating discharge: Over 30 minutes  SIGNED:   Erick BlinksMEMON,Krew Hortman, MD  [facility] 04/13/2017, 10:44 AM Pager   If 7PM-7AM, please contact night-coverage [email or web address] Password [email or web address]

## 2017-04-13 NOTE — Progress Notes (Signed)
Discharge instructions and prescription given, verbalized understanding, out in stable condition via w/c with staff. 

## 2017-04-13 NOTE — Care Management Important Message (Signed)
Important Message  Patient Details  Name: [name] MRN: [id number] Date of Birth: 01-18-1966   Medicare Important Message Given:  Yes    [name], RN 04/13/2017, 11:16 AM

## 2017-04-16 DIAGNOSIS — I509 Heart failure, unspecified: Secondary | ICD-10-CM | POA: Diagnosis not present

## 2017-04-16 DIAGNOSIS — J449 Chronic obstructive pulmonary disease, unspecified: Secondary | ICD-10-CM | POA: Diagnosis not present

## 2017-04-16 DIAGNOSIS — R0602 Shortness of breath: Secondary | ICD-10-CM | POA: Diagnosis not present

## 2017-04-16 DIAGNOSIS — Z7951 Long term (current) use of inhaled steroids: Secondary | ICD-10-CM | POA: Diagnosis not present

## 2017-04-16 DIAGNOSIS — R600 Localized edema: Secondary | ICD-10-CM | POA: Diagnosis not present

## 2017-04-16 DIAGNOSIS — I252 Old myocardial infarction: Secondary | ICD-10-CM | POA: Diagnosis not present

## 2017-04-16 DIAGNOSIS — R071 Chest pain on breathing: Secondary | ICD-10-CM | POA: Diagnosis not present

## 2017-04-16 DIAGNOSIS — Z7901 Long term (current) use of anticoagulants: Secondary | ICD-10-CM | POA: Diagnosis not present

## 2017-04-16 DIAGNOSIS — Z87891 Personal history of nicotine dependence: Secondary | ICD-10-CM | POA: Diagnosis not present

## 2017-04-16 DIAGNOSIS — I110 Hypertensive heart disease with heart failure: Secondary | ICD-10-CM | POA: Diagnosis not present

## 2017-04-16 DIAGNOSIS — I4891 Unspecified atrial fibrillation: Secondary | ICD-10-CM | POA: Diagnosis not present

## 2017-04-16 DIAGNOSIS — E7800 Pure hypercholesterolemia, unspecified: Secondary | ICD-10-CM | POA: Diagnosis not present

## 2017-04-16 DIAGNOSIS — Z79899 Other long term (current) drug therapy: Secondary | ICD-10-CM | POA: Diagnosis not present

## 2017-04-16 DIAGNOSIS — I429 Cardiomyopathy, unspecified: Secondary | ICD-10-CM | POA: Diagnosis not present

## 2017-04-19 ENCOUNTER — Encounter (HOSPITAL_COMMUNITY): Payer: Self-pay | Admitting: Emergency Medicine

## 2017-04-19 ENCOUNTER — Inpatient Hospital Stay (HOSPITAL_COMMUNITY)
Admission: EM | Admit: 2017-04-19 | Discharge: 2017-04-23 | DRG: 292 | Disposition: A | Discharge status: Home / Self Care | Payer: Medicare Other | Attending: Internal Medicine | Admitting: Internal Medicine

## 2017-04-19 ENCOUNTER — Emergency Department (HOSPITAL_COMMUNITY): Payer: Medicare Other

## 2017-04-19 DIAGNOSIS — Z8711 Personal history of peptic ulcer disease: Secondary | ICD-10-CM | POA: Diagnosis not present

## 2017-04-19 DIAGNOSIS — Z79899 Other long term (current) drug therapy: Secondary | ICD-10-CM | POA: Diagnosis not present

## 2017-04-19 DIAGNOSIS — G4730 Sleep apnea, unspecified: Secondary | ICD-10-CM | POA: Diagnosis present

## 2017-04-19 DIAGNOSIS — Z6841 Body Mass Index (BMI) 40.0 and over, adult: Secondary | ICD-10-CM

## 2017-04-19 DIAGNOSIS — I252 Old myocardial infarction: Secondary | ICD-10-CM | POA: Diagnosis not present

## 2017-04-19 DIAGNOSIS — D649 Anemia, unspecified: Secondary | ICD-10-CM | POA: Diagnosis not present

## 2017-04-19 DIAGNOSIS — Z91018 Allergy to other foods: Secondary | ICD-10-CM

## 2017-04-19 DIAGNOSIS — I509 Heart failure, unspecified: Secondary | ICD-10-CM

## 2017-04-19 DIAGNOSIS — R000 Tachycardia, unspecified: Secondary | ICD-10-CM | POA: Diagnosis not present

## 2017-04-19 DIAGNOSIS — Z888 Allergy status to other drugs, medicaments and biological substances status: Secondary | ICD-10-CM | POA: Diagnosis not present

## 2017-04-19 DIAGNOSIS — M1990 Unspecified osteoarthritis, unspecified site: Secondary | ICD-10-CM | POA: Diagnosis not present

## 2017-04-19 DIAGNOSIS — J45909 Unspecified asthma, uncomplicated: Secondary | ICD-10-CM | POA: Diagnosis not present

## 2017-04-19 DIAGNOSIS — I248 Other forms of acute ischemic heart disease: Secondary | ICD-10-CM | POA: Diagnosis not present

## 2017-04-19 DIAGNOSIS — Z8249 Family history of ischemic heart disease and other diseases of the circulatory system: Secondary | ICD-10-CM

## 2017-04-19 DIAGNOSIS — Z7901 Long term (current) use of anticoagulants: Secondary | ICD-10-CM

## 2017-04-19 DIAGNOSIS — Z87891 Personal history of nicotine dependence: Secondary | ICD-10-CM

## 2017-04-19 DIAGNOSIS — I5023 Acute on chronic systolic (congestive) heart failure: Secondary | ICD-10-CM | POA: Diagnosis not present

## 2017-04-19 DIAGNOSIS — I482 Chronic atrial fibrillation, unspecified: Secondary | ICD-10-CM | POA: Diagnosis present

## 2017-04-19 DIAGNOSIS — Z7982 Long term (current) use of aspirin: Secondary | ICD-10-CM

## 2017-04-19 DIAGNOSIS — Z9111 Patient's noncompliance with dietary regimen: Secondary | ICD-10-CM | POA: Diagnosis not present

## 2017-04-19 DIAGNOSIS — R531 Weakness: Secondary | ICD-10-CM | POA: Diagnosis not present

## 2017-04-19 DIAGNOSIS — I10 Essential (primary) hypertension: Secondary | ICD-10-CM | POA: Diagnosis present

## 2017-04-19 DIAGNOSIS — E785 Hyperlipidemia, unspecified: Secondary | ICD-10-CM | POA: Diagnosis not present

## 2017-04-19 DIAGNOSIS — R7989 Other specified abnormal findings of blood chemistry: Secondary | ICD-10-CM | POA: Diagnosis present

## 2017-04-19 DIAGNOSIS — L299 Pruritus, unspecified: Secondary | ICD-10-CM | POA: Diagnosis present

## 2017-04-19 DIAGNOSIS — Z9114 Patient's other noncompliance with medication regimen: Secondary | ICD-10-CM

## 2017-04-19 DIAGNOSIS — Z823 Family history of stroke: Secondary | ICD-10-CM | POA: Diagnosis not present

## 2017-04-19 DIAGNOSIS — Z9981 Dependence on supplemental oxygen: Secondary | ICD-10-CM | POA: Diagnosis not present

## 2017-04-19 DIAGNOSIS — J811 Chronic pulmonary edema: Secondary | ICD-10-CM

## 2017-04-19 DIAGNOSIS — G4733 Obstructive sleep apnea (adult) (pediatric): Secondary | ICD-10-CM | POA: Diagnosis not present

## 2017-04-19 DIAGNOSIS — I110 Hypertensive heart disease with heart failure: Principal | ICD-10-CM | POA: Diagnosis present

## 2017-04-19 DIAGNOSIS — I429 Cardiomyopathy, unspecified: Secondary | ICD-10-CM | POA: Diagnosis present

## 2017-04-19 DIAGNOSIS — E7849 Other hyperlipidemia: Secondary | ICD-10-CM | POA: Diagnosis not present

## 2017-04-19 DIAGNOSIS — R404 Transient alteration of awareness: Secondary | ICD-10-CM | POA: Diagnosis not present

## 2017-04-19 DIAGNOSIS — Z9119 Patient's noncompliance with other medical treatment and regimen: Secondary | ICD-10-CM

## 2017-04-19 DIAGNOSIS — R778 Other specified abnormalities of plasma proteins: Secondary | ICD-10-CM | POA: Diagnosis present

## 2017-04-19 DIAGNOSIS — K219 Gastro-esophageal reflux disease without esophagitis: Secondary | ICD-10-CM | POA: Diagnosis present

## 2017-04-19 DIAGNOSIS — E669 Obesity, unspecified: Secondary | ICD-10-CM | POA: Diagnosis present

## 2017-04-19 DIAGNOSIS — R748 Abnormal levels of other serum enzymes: Secondary | ICD-10-CM | POA: Diagnosis not present

## 2017-04-19 DIAGNOSIS — R5381 Other malaise: Secondary | ICD-10-CM | POA: Diagnosis not present

## 2017-04-19 DIAGNOSIS — R0602 Shortness of breath: Secondary | ICD-10-CM | POA: Diagnosis not present

## 2017-04-19 LAB — URINALYSIS, ROUTINE W REFLEX MICROSCOPIC
BACTERIA UA: NONE SEEN
BILIRUBIN URINE: NEGATIVE
Glucose, UA: NEGATIVE mg/dL
Hgb urine dipstick: NEGATIVE
KETONES UR: NEGATIVE mg/dL
LEUKOCYTES UA: NEGATIVE
Nitrite: NEGATIVE
PH: 5 (ref 5.0–8.0)
PROTEIN: 30 mg/dL — AB
SQUAMOUS EPITHELIAL / LPF: NONE SEEN
Specific Gravity, Urine: 1.023 (ref 1.005–1.030)

## 2017-04-19 LAB — COMPREHENSIVE METABOLIC PANEL
ALBUMIN: 3.6 g/dL (ref 3.5–5.0)
ALK PHOS: 94 U/L (ref 38–126)
ALT: 17 U/L (ref 17–63)
ANION GAP: 6 (ref 5–15)
AST: 23 U/L (ref 15–41)
BUN: 18 mg/dL (ref 6–20)
CALCIUM: 8.5 mg/dL — AB (ref 8.9–10.3)
CO2: 28 mmol/L (ref 22–32)
Chloride: 102 mmol/L (ref 101–111)
Creatinine, Ser: 1.2 mg/dL (ref 0.61–1.24)
GFR calc Af Amer: >60 mL/min (ref >60)
GFR calc non Af Amer: >60 mL/min (ref >60)
GLUCOSE: 97 mg/dL (ref 65–99)
Potassium: 3.5 mmol/L (ref 3.5–5.1)
SODIUM: 136 mmol/L (ref 135–145)
Total Bilirubin: 1.2 mg/dL (ref 0.3–1.2)
Total Protein: 6.5 g/dL (ref 6.5–8.1)

## 2017-04-19 LAB — RAPID URINE DRUG SCREEN, HOSP PERFORMED
Amphetamines: NOT DETECTED
Barbiturates: NOT DETECTED
Benzodiazepines: NOT DETECTED
COCAINE: NOT DETECTED
OPIATES: NOT DETECTED
TETRAHYDROCANNABINOL: NOT DETECTED

## 2017-04-19 LAB — MAGNESIUM: Magnesium: 2 mg/dL (ref 1.7–2.4)

## 2017-04-19 LAB — BLOOD GAS, ARTERIAL
ACID-BASE EXCESS: 2 mmol/L (ref 0.0–2.0)
Bicarbonate: 25.4 mmol/L (ref 20.0–28.0)
DRAWN BY: 317771
O2 CONTENT: 3 L/min
O2 SAT: 71.5 %
PCO2 ART: 48 mmHg (ref 32.0–48.0)
pH, Arterial: 7.366 (ref 7.350–7.450)
pO2, Arterial: 44.6 mmHg — ABNORMAL LOW (ref 83.0–108.0)

## 2017-04-19 LAB — TROPONIN I: Troponin I: 0.03 ng/mL (ref <0.03)

## 2017-04-19 LAB — BRAIN NATRIURETIC PEPTIDE: B Natriuretic Peptide: 509 pg/mL — ABNORMAL HIGH (ref 0.0–100.0)

## 2017-04-19 LAB — ETHANOL

## 2017-04-19 MED ORDER — BUDESONIDE 0.25 MG/2ML IN SUSP
0.2500 mg | Freq: Two times a day (BID) | RESPIRATORY_TRACT | Status: DC
  Administered 2017-04-20 (×2): 0.25 mg via RESPIRATORY_TRACT
  Filled 2017-04-19 (×2): qty 2

## 2017-04-19 MED ORDER — POTASSIUM CHLORIDE CRYS ER 20 MEQ PO TBCR
20.0000 meq | EXTENDED_RELEASE_TABLET | Freq: Two times a day (BID) | ORAL | Status: DC
  Administered 2017-04-20 (×2): 20 meq via ORAL
  Filled 2017-04-19 (×2): qty 1

## 2017-04-19 MED ORDER — MAGNESIUM SULFATE 2 GM/50ML IV SOLN
2.0000 g | Freq: Once | INTRAVENOUS | Status: AC
  Administered 2017-04-20: 2 g via INTRAVENOUS
  Filled 2017-04-19: qty 50

## 2017-04-19 MED ORDER — FUROSEMIDE 10 MG/ML IJ SOLN
80.0000 mg | Freq: Once | INTRAMUSCULAR | Status: AC
  Administered 2017-04-19: 80 mg via INTRAVENOUS
  Filled 2017-04-19: qty 8

## 2017-04-19 MED ORDER — DOCUSATE SODIUM 100 MG PO CAPS: 100.0000 mg | ORAL_CAPSULE | Freq: Two times a day (BID) | ORAL | Status: DC | PRN

## 2017-04-19 MED ORDER — ATORVASTATIN CALCIUM 40 MG PO TABS
40.0000 mg | ORAL_TABLET | Freq: Every day | ORAL | Status: DC
  Administered 2017-04-20 – 2017-04-22 (×3): 40 mg via ORAL
  Filled 2017-04-19 (×3): qty 1

## 2017-04-19 MED ORDER — MORPHINE SULFATE (PF) 4 MG/ML IV SOLN
4.0000 mg | Freq: Once | INTRAVENOUS | Status: AC
  Administered 2017-04-19: 4 mg via INTRAVENOUS
  Filled 2017-04-19: qty 1

## 2017-04-19 MED ORDER — IPRATROPIUM BROMIDE 0.02 % IN SOLN
0.5000 mg | Freq: Once | RESPIRATORY_TRACT | Status: AC
  Administered 2017-04-19: 0.5 mg via RESPIRATORY_TRACT
  Filled 2017-04-19: qty 2.5

## 2017-04-19 MED ORDER — FERROUS GLUCONATE 324 (38 FE) MG PO TABS
324.0000 mg | ORAL_TABLET | Freq: Every day | ORAL | Status: DC
  Administered 2017-04-20 – 2017-04-23 (×4): 324 mg via ORAL
  Filled 2017-04-19 (×7): qty 1

## 2017-04-19 MED ORDER — LEVALBUTEROL HCL 1.25 MG/0.5ML IN NEBU
1.2500 mg | INHALATION_SOLUTION | Freq: Four times a day (QID) | RESPIRATORY_TRACT | Status: DC | PRN
  Administered 2017-04-19: 1.25 mg via RESPIRATORY_TRACT
  Filled 2017-04-19: qty 0.5

## 2017-04-19 MED ORDER — ASPIRIN EC 81 MG PO TBEC
81.0000 mg | DELAYED_RELEASE_TABLET | Freq: Every day | ORAL | Status: DC
  Administered 2017-04-20 – 2017-04-23 (×4): 81 mg via ORAL
  Filled 2017-04-19 (×4): qty 1

## 2017-04-19 MED ORDER — RIVAROXABAN 20 MG PO TABS
20.0000 mg | ORAL_TABLET | Freq: Every day | ORAL | Status: DC
  Administered 2017-04-20 – 2017-04-22 (×3): 20 mg via ORAL
  Filled 2017-04-19 (×4): qty 1

## 2017-04-19 MED ORDER — FUROSEMIDE 10 MG/ML IJ SOLN
80.0000 mg | Freq: Two times a day (BID) | INTRAMUSCULAR | Status: DC
  Administered 2017-04-20: 80 mg via INTRAVENOUS
  Filled 2017-04-19: qty 8

## 2017-04-19 MED ORDER — TAMSULOSIN HCL 0.4 MG PO CAPS
0.4000 mg | ORAL_CAPSULE | Freq: Every day | ORAL | Status: DC
  Administered 2017-04-20 – 2017-04-23 (×4): 0.4 mg via ORAL
  Filled 2017-04-19 (×5): qty 1

## 2017-04-19 MED ORDER — PANTOPRAZOLE SODIUM 40 MG PO TBEC
40.0000 mg | DELAYED_RELEASE_TABLET | Freq: Every evening | ORAL | Status: DC
  Administered 2017-04-20 – 2017-04-22 (×3): 40 mg via ORAL
  Filled 2017-04-19 (×3): qty 1

## 2017-04-19 MED ORDER — DIPHENHYDRAMINE HCL 50 MG/ML IJ SOLN
25.0000 mg | Freq: Once | INTRAMUSCULAR | Status: AC
  Administered 2017-04-19: 25 mg via INTRAVENOUS
  Filled 2017-04-19: qty 1

## 2017-04-19 MED ORDER — POTASSIUM CHLORIDE CRYS ER 20 MEQ PO TBCR
40.0000 meq | EXTENDED_RELEASE_TABLET | Freq: Once | ORAL | Status: AC
  Administered 2017-04-19: 40 meq via ORAL
  Filled 2017-04-19: qty 2

## 2017-04-19 MED ORDER — NITROGLYCERIN 2 % TD OINT
1.0000 [in_us] | TOPICAL_OINTMENT | Freq: Once | TRANSDERMAL | Status: AC
  Administered 2017-04-19: 1 [in_us] via TOPICAL
  Filled 2017-04-19: qty 1

## 2017-04-19 MED ORDER — LISINOPRIL 5 MG PO TABS
5.0000 mg | ORAL_TABLET | Freq: Every day | ORAL | Status: DC
  Administered 2017-04-20 – 2017-04-23 (×4): 5 mg via ORAL
  Filled 2017-04-19 (×5): qty 1

## 2017-04-19 MED ORDER — IPRATROPIUM BROMIDE 0.02 % IN SOLN: 0.5000 mg | Freq: Four times a day (QID) | RESPIRATORY_TRACT | Status: DC | PRN

## 2017-04-19 MED ORDER — CARVEDILOL 12.5 MG PO TABS
25.0000 mg | ORAL_TABLET | Freq: Two times a day (BID) | ORAL | Status: DC
  Administered 2017-04-20 – 2017-04-23 (×7): 25 mg via ORAL
  Filled 2017-04-19 (×7): qty 2

## 2017-04-19 NOTE — ED Notes (Signed)
CRITICAL VALUE ALERT  Critical Value:  Troponin 0.03 Date & Time Notied:  04/19/17 @ 2107 Provider Notified: Ranae Palms Orders Received/Actions taken: None yet

## 2017-04-19 NOTE — ED Triage Notes (Signed)
Pt called EMS initially for shortness of breath but that had resolved by the time they got to pt's home and changed to Altered Mental Status.

## 2017-04-19 NOTE — H&P (Signed)
History and Physical    Samuel KlippelMark T Buser ZOX:[id number] DOB: 08/24/1965 DOA: 04/19/2017  PCP: Pearson GrippeKim, [name], MD   Patient coming from: Home.  I have personally briefly reviewed patient's old medical records in [facility]  Chief Complaint: Shortness of breath and AMS  HPI: [name] is a 51 y.o. male with medical history significant of allergic rhinitis, asthma, mitral fibrillation, chronic systolic CHF, essential hypertension, GERD, PUD, history of MI with normal coronaries in December 2016, treatment noncompliance, OSA not on CPAP, osteoarthritis who was brought to the emergency department via EMS due to progressively worse dyspnea, dry cough, sore throat, wheezing, worsening lower extremity edema, PND and orthopnea for the past 2-3 days.   He was recently admitted from 10/21 to 04/13/2017 to this facility due to acute on chronic systolic CHF. After discharge, he states that he was seen at [facility], given IV Lasix and discharged home 2 days ago. The patient states that he has been compliant with fluid/sodium intake and has been using his medications as prescribed by the physicians.  EMS reported that the patient was initially drowsy, but that has resolved after the patient has been given treatment in the emergency department. He denies headache, rhinorrhea, chest pain, dizziness, palpitations, diaphoresis, abdominal pain, nausea, emesis, diarrhea, constipation, melena or hematochezia. He denies dysuria, frequency or hematuria. Denies polyphagia, polydipsia, polyuria or blurred vision. He complains of occasional pruritus of the lower extremities.  ED Course: Initial vital signs temperature 98.37F, pulse 93, respirations 22, blood pressure 165/135 mmHg and O2 sat 92% on room air.  His workup shows an unremarkable CMP, negative urine toxicology, urinary analysis with minimal proteinuria, normal alcohol, mildly elevated troponin of 0.03 ng/mL and a BNP of 509 pg/mL. His EKG shows  atrial fibrillation with RVR with a ventricular rate of 105 BPM and nonspecific T-wave abnormalities. Not significantly changed from previous. ABG showed a pH of 7.36, PCO2 of 48, all other values were unremarkable except for a PCO2 of 44.6 mmHg. His chest radiograph showed cardiomegaly and pulmonary vascular congestion. Please see image and radiology report for further detail.  Medications in ED: He was provided supplemental oxygen and furosemide 80 mg IVP 1 dose. I also ordered nitro paste 2% 1 inch patch, K Dur 40 mEq by mouth 1 dose, Benadryl 25 mg IVP 1 dose for lower extremities pruritus, morphine 4 mg IVP 1 dose for bilateral knee pain and acute CHF and Xopenex 1.25+ Atrovent 0.5 mg twice a day and nebulizer 1. The patient stated that he feels better after this treatment.  Review of Systems: As per HPI otherwise 10 point review of systems negative.    Past Medical History:  Diagnosis Date  . Allergic rhinitis   . Asthma   . Atrial fibrillation (HCC)   . CHF (congestive heart failure) (HCC)   . Essential hypertension   . Gastroesophageal reflux disease   . Heart attack (HCC)   . History of cardiac catheterization    Normal coronaries December 2016  . Noncompliance    Major problem leading to declining health and recurrent hospitalization  . Nonischemic cardiomyopathy (HCC)    LVEF 20-25%  . On home O2    3L N/C  . OSA (obstructive sleep apnea)   . Osteoarthritis   . Peptic ulcer disease     Past Surgical History:  Procedure Laterality Date  . CARDIAC CATHETERIZATION N/A 06/14/2015   Procedure: Right/Left Heart Cath and Coronary Angiography;  Surgeon: Runell GessJonathan J Berry, MD;  Location: [facility] INVASIVE CV LAB;  Service: Cardiovascular;  Laterality: N/A;     reports that he quit smoking about 9 years ago. His smoking use included Cigarettes. He started smoking about 29 years ago. He has a 10.00 pack-year smoking history. He has never used smokeless tobacco. He reports that he  does not drink alcohol or use drugs.  Allergies  Allergen Reactions  . Banana Shortness Of Breath  . Bee Venom Shortness Of Breath, Swelling and Other (See Comments)    Reaction:  Facial swelling  . Food Shortness Of Breath, Rash and Other (See Comments)    Pt states that he is allergic to strawberries.    . Aspirin Other (See Comments)    Reaction:  GI upset   . Metolazone Other (See Comments)    Pt states that he stopped taking this med due to heart attack like symptoms.   . Oatmeal Nausea And Vomiting  . Orange Juice [Orange Oil] Nausea And Vomiting    All acidic products make him nauseous and upset stomach  . Torsemide Swelling and Other (See Comments)    Reaction:  Swelling of feet/legs   . Diltiazem Palpitations  . Hydralazine Palpitations  . Lipitor [Atorvastatin] Other (See Comments)    Reaction:  Nose bleeds     Family History  Problem Relation Age of Onset  . Stroke Father   . Heart attack Father   . Aneurysm Mother        Cerebral aneurysm  . Hypertension Sister   . Colon cancer Neg Hx   . Inflammatory bowel disease Neg Hx   . Liver disease Neg Hx     Prior to Admission medications   Medication Sig Start Date End Date Taking? Authorizing Provider  albuterol (PROVENTIL) (2.5 MG/3ML) 0.083% nebulizer solution Take 2.5 mg by nebulization every 6 (six) hours as needed for wheezing or shortness of breath.   Yes [provider]  aspirin 81 MG chewable tablet Chew 1 tablet (81 mg total) by mouth daily. 04/13/17  Yes Erick Blinks, MD  atorvastatin (LIPITOR) 40 MG tablet Take 1 tablet (40 mg total) by mouth daily at 6 PM. 06/25/16  Yes Kathlen Mody, MD  beclomethasone (QVAR) 80 MCG/ACT inhaler Inhale 2 puffs into the lungs 2 (two) times daily.    Yes [provider]  bumetanide (BUMEX) 2 MG tablet Take 1 tablet (2 mg total) by mouth 2 (two) times daily. 11/13/16  Yes Tat, Onalee Hua, MD  carvedilol (COREG) 25 MG tablet Take 1 tablet (25 mg total) by mouth 2  (two) times daily with a meal. 11/13/16  Yes Tat, Onalee Hua, MD  docusate sodium (COLACE) 100 MG capsule Take 1 capsule (100 mg total) by mouth 2 (two) times daily. Patient taking differently: Take 100 mg by mouth 2 (two) times daily as needed for mild constipation.  06/25/16  Yes Kathlen Mody, MD  ferrous gluconate (FERGON) 324 MG tablet Take 1 tablet (324 mg total) by mouth 2 (two) times daily with a meal. Patient taking differently: Take 324 mg by mouth daily with breakfast.  06/25/16  Yes Kathlen Mody, MD  lisinopril (PRINIVIL,ZESTRIL) 5 MG tablet Take 1 tablet (5 mg total) by mouth daily. 04/13/17  Yes Erick Blinks, MD  OXYGEN Inhale 3.5 L into the lungs daily.   Yes [provider]  pantoprazole (PROTONIX) 40 MG tablet Take 1 tablet (40 mg total) by mouth daily at 6 (six) AM. Patient taking differently: Take 40 mg by mouth every evening.  06/26/16  Yes Kathlen Mody, MD  potassium chloride (K-DUR,KLOR-CON) 20 MEQ tablet Take 1 tablet (20 mEq total) by mouth daily. 02/20/17  Yes Houston Siren, MD  rivaroxaban (XARELTO) 20 MG TABS tablet Take 1 tablet (20 mg total) by mouth daily with breakfast. Patient taking differently: Take 20 mg by mouth daily with supper.  06/26/16  Yes Kathlen Mody, MD  tamsulosin (FLOMAX) 0.4 MG CAPS capsule Take 1 capsule (0.4 mg total) by mouth daily. 06/25/16  Yes Kathlen Mody, MD    Physical Exam: Vitals:   04/19/17 2130 04/19/17 2145 04/19/17 2200 04/19/17 2248  BP: (!) 173/107  (!) 184/114   Pulse: 100 (!) 121 (!) 102   Resp: 20 20    Temp:      TempSrc:      SpO2: 100% 100% 100% 100%  Weight:        Constitutional: Mildly anxious and dyspneic. Eyes: PERRL, lids and conjunctivae normal ENMT: Mucous membranes are moist. Posterior pharynx clear of any exudate or lesions. Neck: normal, supple, no masses, no thyromegaly Respiratory: Decreased breath sounds on lower lobe fields with mild wheezing and bibasilar crackles. Normal respiratory effort. No accessory  muscle use.  Cardiovascular: Tachycardic at 101 BPM, irregularly irregular, no murmurs / rubs / gallops. 3+ lower extremities edema. Stage II lymphedema. 2+ pedal pulses. No carotid bruits.  Abdomen: Obese, soft, no tenderness, no masses palpated. No hepatosplenomegaly. Bowel sounds positive.  Musculoskeletal: no clubbing / cyanosis. Good ROM, no contractures. Normal muscle tone.  Skin: Small ecchymosis areas and extremities. Positive abrassions on lower extremities from scratching due to pruritus. Neurologic: CN 2-12 grossly intact. Sensation intact, DTR normal. Strength 5/5 in all 4.  Psychiatric: Normal judgment and insight. Alert and oriented x 4.    Labs on Admission: I have personally reviewed following labs and imaging studies  CBC: No results for input(s): WBC, NEUTROABS, HGB, HCT, MCV, PLT in the last 168 hours. Basic Metabolic Panel:  Recent Labs Lab 04/13/17 0613 04/19/17 2009  NA 140 136  K 3.7 3.5  CL 104 102  CO2 27 28  GLUCOSE 89 97  BUN 23* 18  CREATININE 1.37* 1.20  CALCIUM 8.3* 8.5*  MG  --  2.0   GFR: Estimated Creatinine Clearance: 114.5 mL/min (by C-G formula based on SCr of 1.2 mg/dL). Liver Function Tests:  Recent Labs Lab 04/19/17 2009  AST 23  ALT 17  ALKPHOS 94  BILITOT 1.2  PROT 6.5  ALBUMIN 3.6   No results for input(s): LIPASE, AMYLASE in the last 168 hours. No results for input(s): AMMONIA in the last 168 hours. Coagulation Profile: No results for input(s): INR, PROTIME in the last 168 hours. Cardiac Enzymes:  Recent Labs Lab 04/19/17 2009  TROPONINI 0.03*   BNP (last 3 results) No results for input(s): PROBNP in the last 8760 hours. HbA1C: No results for input(s): HGBA1C in the last 72 hours. CBG: No results for input(s): GLUCAP in the last 168 hours. Lipid Profile: No results for input(s): CHOL, HDL, LDLCALC, TRIG, CHOLHDL, LDLDIRECT in the last 72 hours. Thyroid Function Tests: No results for input(s): TSH, T4TOTAL,  FREET4, T3FREE, THYROIDAB in the last 72 hours. Anemia Panel: No results for input(s): VITAMINB12, FOLATE, FERRITIN, TIBC, IRON, RETICCTPCT in the last 72 hours. Urine analysis:    Component Value Date/Time   COLORURINE YELLOW 04/19/2017 2030   APPEARANCEUR CLEAR 04/19/2017 2030   LABSPEC 1.023 04/19/2017 2030   PHURINE 5.0 04/19/2017 2030   GLUCOSEU NEGATIVE 04/19/2017 2030  GLUCOSEU NEG mg/dL 16/03/9603 5409   HGBUR NEGATIVE 04/19/2017 2030   BILIRUBINUR NEGATIVE 04/19/2017 2030   KETONESUR NEGATIVE 04/19/2017 2030   PROTEINUR 30 (A) 04/19/2017 2030   UROBILINOGEN 0.2 08/08/2014 1445   NITRITE NEGATIVE 04/19/2017 2030   LEUKOCYTESUR NEGATIVE 04/19/2017 2030    Radiological Exams on Admission: Dg Chest Port 1 View  Result Date: 04/19/2017 CLINICAL DATA:  Shortness of breath, swelling EXAM: PORTABLE CHEST 1 VIEW COMPARISON:  04/16/2017 FINDINGS: Cardiomegaly with pulmonary vascular congestion. Very mild interstitial edema is possible. No definite pleural effusions. No pneumothorax. IMPRESSION: Cardiomegaly with pulmonary vascular congestion and possible mild interstitial edema. Electronically Signed   By: Charline Bills M.D.   On: 04/19/2017 20:26   11/10/2016 echocardiogram ------------------------------------------------------------------- LV EF: 35% -   40%  ------------------------------------------------------------------- Indications:      CHF - 428.0.  ------------------------------------------------------------------- History:   PMH:  Former Smoker. GERD, Elevated Troponin, Noncompliance with Meds, PHTN.  Risk factors:  Hypertension. Dyslipidemia.  ------------------------------------------------------------------- Study Conclusions  - Procedure narrative: Transthoracic echocardiography. Image   quality was adequate. Intravenous contrast (Definity) was   administered. - Left ventricle: The cavity size was mildly dilated. Wall   thickness was increased in  a pattern of mild LVH. Systolic   function was moderately reduced. The estimated ejection fraction   was in the range of 35% to 40%. Diffuse hypokinesis. The study   was not technically sufficient to allow evaluation of LV   diastolic dysfunction due to atrial fibrillation. - Regional wall motion abnormality: Akinesis of the basal-mid   inferior myocardium. - Mitral valve: There was mild regurgitation. - Left atrium: The atrium was mildly dilated. - Right ventricle: The cavity size was mildly dilated. Systolic   function was mildly reduced. - Right atrium: The atrium was mildly dilated. - Atrial septum: There was a patent foramen ovale. - Tricuspid valve: There was mild regurgitation. - Pulmonary arteries: PA peak pressure: 56 mm Hg (S). - Inferior vena cava: The vessel was dilated. The respirophasic   diameter changes were blunted (< 50%), consistent with elevated   central venous pressure.  Impressions:  - LVEF with no marked differences (perhaps slightly improved) from   prior study dated 06/22/16.  EKG: Independently reviewed. Vent. rate 105 BPM PR interval * ms QRS duration 98 ms QT/QTc 385/509 ms P-R-T axes * 55 126 Atrial fibrillation Nonspecific T abnormalities, lateral leads Prolonged QT interval  Assessment/Plan Principal Problem:   Acute on chronic systolic (congestive) heart failure (HCC) Observation/telemetry. Continue supplemental oxygen. Fluid restriction. Check daily weights. Monitor intake and output. Furosemide 80 mg IVP twice a day. Supplement potassium. Follow-up renal function and electrolytes. Nitropaste to anterior chest wall 1 tonight. Hold beta blocker tonight. Morphine 4 mg IVP 1 dose given for knee pain, but also should help with his acute symptoms.   Active Problems:   Chronic atrial fibrillation (HCC) CHA?DS?-VASc Score of at least 3. Continue Xarelto. Resume carvedilol 25 mg by mouth twice a day  in a.m.    Elevated  troponin Minimally elevated, likely due to tachycardia-induced demand ischemia. No chest pain at this time and he is on aspirin and Xarelto. I will recheck level in a.m.    Hyperlipidemia Continue atorvastatin 40 mg by mouth daily. LFTs were normal. Follow-up fasting lipid panel per PCP.    Essential hypertension Hold carvedilol tonight due to acute decompensation. Resume carvedilol 25 mg by mouth twice a day in the morning. Monitor blood pressure and heart rate.    Asthma  Continue supplemental oxygen. Xopenex plus Atrovent every 6 hours when necessary.    GERD (gastroesophageal reflux disease) Protonix 40 mg by mouth daily.    Normocytic anemia No CBC at results are available earlier today. Last hemoglobin level is 11.1 g/dL 8 days ago. Check CBC with morning labs.      DVT prophylaxis: On Xarelto. Code Status: Full code. Family Communication: His mother talked to me on the phone about his symptoms. Disposition Plan: Observation for acute on chronic systolic CHF treament. Consults called:  Admission status: Observation/Telemetry.   Bobette Mo MD [facility] Pager [phone].  If 7PM-7AM, please contact night-coverage [email or web address] Password [facility]  04/19/2017, 10:58 PM   This document was prepared using Dragon voice recognition software and may contain some unintended errors.

## 2017-04-19 NOTE — ED Provider Notes (Signed)
[facility] MEDICAL SURGICAL UNIT Provider Note   CSN: [id number] Arrival date & time: 04/19/17  1938     History   Chief Complaint Chief Complaint  Patient presents with  . Altered Mental Status    HPI [name] is a 51 y.o. male.  HPI Patient presents with with increased edema and shortness of breath.  States that after he was discharged from the hospital on 10/23 he went to [facility] and was admitted for several days.  States he has had a cough productive of clear sputum.  Denies fever or chills.  Patient states his been compliant with Bumex. Past Medical History:  Diagnosis Date  . Allergic rhinitis   . Asthma   . Atrial fibrillation (HCC)   . CHF (congestive heart failure) (HCC)   . Essential hypertension   . Gastroesophageal reflux disease   . Heart attack (HCC)   . History of cardiac catheterization    Normal coronaries December 2016  . Noncompliance    Major problem leading to declining health and recurrent hospitalization  . Nonischemic cardiomyopathy (HCC)    LVEF 20-25%  . On home O2    3L N/C  . OSA (obstructive sleep apnea)   . Osteoarthritis   . Peptic ulcer disease     Patient Active Problem List   Diagnosis Date Noted  . Acute exacerbation of CHF (congestive heart failure) (HCC) 04/20/2017  . Acute on chronic systolic (congestive) heart failure (HCC) 04/19/2017  . CHF exacerbation (HCC) 04/11/2017  . Chronic respiratory failure with hypoxia (HCC) 04/11/2017  . Encounter for hospice care discussion   . Palliative care encounter   . Goals of care, counseling/discussion   . DNR (do not resuscitate)   . DNR (do not resuscitate) discussion   . Chronic congestive heart failure (HCC) 02/18/2017  . Acute systolic heart failure (HCC) 01/18/2017  . Pedal edema 12/02/2016  . Acute CHF (congestive heart failure) (HCC) 11/09/2016  . Acute on chronic systolic CHF (congestive heart failure) (HCC) 08/19/2016  . CKD (chronic kidney disease), stage II  05/17/2016  . Coronary arteries, normal Dec 2016 01/23/2016  . Chronic anticoagulation-Xarelto 01/23/2016  . NSVT (nonsustained ventricular tachycardia) (HCC) 01/23/2016  . Elevated troponin 12/21/2015  . Nonischemic cardiomyopathy (HCC)   . Morbid obesity due to excess calories (HCC)   . Noncompliance with diet and medication regimen 12/02/2015  . OSA (obstructive sleep apnea) 09/20/2015  . Pulmonary hypertension (HCC) 09/19/2015  . Normocytic anemia 09/19/2015  . Chronic atrial fibrillation (HCC)   . Dyspnea on exertion 05/28/2015  . Acute respiratory failure with hypoxia (HCC) 04/01/2015  . Hypokalemia 04/01/2015  . Obesity hypoventilation syndrome (HCC) 08/08/2014  . GERD (gastroesophageal reflux disease) 08/08/2014  . Edema of right lower extremity 06/21/2014  . Fecal urgency 06/21/2014  . Asthma 02/11/2009  . Hyperlipidemia 07/12/2008  . OBESITY, MORBID 07/12/2008  . Anxiety state 07/12/2008  . DEPRESSION 07/12/2008  . Essential hypertension 07/12/2008  . ALLERGIC RHINITIS 07/12/2008  . Peptic ulcer 07/12/2008  . OSTEOARTHRITIS 07/12/2008    Past Surgical History:  Procedure Laterality Date  . CARDIAC CATHETERIZATION N/A 06/14/2015   Procedure: Right/Left Heart Cath and Coronary Angiography;  Surgeon: Runell Gess, MD;  Location: [facility] INVASIVE CV LAB;  Service: Cardiovascular;  Laterality: N/A;       Home Medications    Prior to Admission medications   Medication Sig Start Date End Date Taking? Authorizing Provider  albuterol (PROVENTIL) (2.5 MG/3ML) 0.083% nebulizer solution Take 2.5 mg by  nebulization every 6 (six) hours as needed for wheezing or shortness of breath.   Yes [provider]  aspirin 81 MG chewable tablet Chew 1 tablet (81 mg total) by mouth daily. 04/13/17  Yes Erick Blinks, MD  atorvastatin (LIPITOR) 40 MG tablet Take 1 tablet (40 mg total) by mouth daily at 6 PM. 06/25/16  Yes Kathlen Mody, MD  beclomethasone (QVAR) 80 MCG/ACT  inhaler Inhale 2 puffs into the lungs 2 (two) times daily.    Yes [provider]  bumetanide (BUMEX) 2 MG tablet Take 1 tablet (2 mg total) by mouth 2 (two) times daily. 11/13/16  Yes Tat, Onalee Hua, MD  carvedilol (COREG) 25 MG tablet Take 1 tablet (25 mg total) by mouth 2 (two) times daily with a meal. 11/13/16  Yes Tat, Onalee Hua, MD  docusate sodium (COLACE) 100 MG capsule Take 1 capsule (100 mg total) by mouth 2 (two) times daily. Patient taking differently: Take 100 mg by mouth 2 (two) times daily as needed for mild constipation.  06/25/16  Yes Kathlen Mody, MD  ferrous gluconate (FERGON) 324 MG tablet Take 1 tablet (324 mg total) by mouth 2 (two) times daily with a meal. Patient taking differently: Take 324 mg by mouth daily with breakfast.  06/25/16  Yes Kathlen Mody, MD  lisinopril (PRINIVIL,ZESTRIL) 5 MG tablet Take 1 tablet (5 mg total) by mouth daily. 04/13/17  Yes Erick Blinks, MD  OXYGEN Inhale 3.5 L into the lungs daily.   Yes [provider]  pantoprazole (PROTONIX) 40 MG tablet Take 1 tablet (40 mg total) by mouth daily at 6 (six) AM. Patient taking differently: Take 40 mg by mouth every evening.  06/26/16  Yes Kathlen Mody, MD  potassium chloride (K-DUR,KLOR-CON) 20 MEQ tablet Take 1 tablet (20 mEq total) by mouth daily. 02/20/17  Yes Houston Siren, MD  rivaroxaban (XARELTO) 20 MG TABS tablet Take 1 tablet (20 mg total) by mouth daily with breakfast. Patient taking differently: Take 20 mg by mouth daily with supper.  06/26/16  Yes Kathlen Mody, MD  tamsulosin (FLOMAX) 0.4 MG CAPS capsule Take 1 capsule (0.4 mg total) by mouth daily. 06/25/16  Yes Kathlen Mody, MD    Family History Family History  Problem Relation Age of Onset  . Stroke Father   . Heart attack Father   . Aneurysm Mother        Cerebral aneurysm  . Hypertension Sister   . Colon cancer Neg Hx   . Inflammatory bowel disease Neg Hx   . Liver disease Neg Hx     Social History Social History  Substance Use  Topics  . Smoking status: Former Smoker    Packs/day: 0.50    Years: 20.00    Types: Cigarettes    Start date: 04/26/1988    Quit date: 06/23/2007  . Smokeless tobacco: Never Used     Comment: 1 ppd former smoker  . Alcohol use No     Comment: No etoh since 2009     Allergies   Banana; Bee venom; Food; Aspirin; Metolazone; Oatmeal; Orange juice [orange oil]; Torsemide; Diltiazem; Hydralazine; and Lipitor [atorvastatin]   Review of Systems Review of Systems  Constitutional: Negative for chills and fever.  Respiratory: Positive for cough and shortness of breath.   Cardiovascular: Positive for leg swelling. Negative for chest pain and palpitations.  Gastrointestinal: Negative for abdominal pain, diarrhea, nausea and vomiting.  Musculoskeletal: Negative for back pain, myalgias, neck pain and neck stiffness.  Skin: Negative for rash  and wound.  Neurological: Negative for dizziness, weakness, light-headedness, numbness and headaches.  Psychiatric/Behavioral: Positive for confusion.  All other systems reviewed and are negative.    Physical Exam Updated Vital Signs BP (!) 148/86 (BP Location: Left Arm)   Pulse 80   Temp (!) 97.3 F (36.3 C) (Oral)   Resp 18   Ht 6' (1.829 m)   Wt (!) 151 kg (332 lb 14.3 oz)   SpO2 94%   BMI 45.15 kg/m   Physical Exam  Constitutional: He is oriented to person, place, and time. He appears well-developed and well-nourished. No distress.  HENT:  Head: Normocephalic and atraumatic.  Mouth/Throat: Oropharynx is clear and moist. No oropharyngeal exudate.  Eyes: Pupils are equal, round, and reactive to light. EOM are normal.  Neck: Normal range of motion. Neck supple.  Cardiovascular:  Tachycardia.  Irregularly irregular.  Pulmonary/Chest: He is in respiratory distress. He has rales.  Increased respiratory effort.  Patient has crackles and diminished breath sounds in bilateral bases.  Abdominal: Soft. Bowel sounds are normal. He exhibits  distension. There is tenderness. There is no rebound and no guarding.  Mild diffuse abdominal tenderness and distention.  No focality, rebound or guarding.  Musculoskeletal: Normal range of motion. He exhibits edema. He exhibits no tenderness.  3+ bilateral lower extremity pitting edema.  Neurological: He is alert and oriented to person, place, and time.  Mild slurring of words.  Appears drowsy.  Moving all extremities without focal deficit.  Sensation intact.  Skin: Skin is warm and dry. Capillary refill takes less than 2 seconds. No rash noted. No erythema.  Psychiatric: He has a normal mood and affect. His behavior is normal.  Nursing note and vitals reviewed.    ED Treatments / Results  Labs (all labs ordered are listed, but only abnormal results are displayed) Labs Reviewed  COMPREHENSIVE METABOLIC PANEL - Abnormal; Notable for the following:       Result Value   Calcium 8.5 (*)    All other components within normal limits  BRAIN NATRIURETIC PEPTIDE - Abnormal; Notable for the following:    B Natriuretic Peptide 509.0 (*)    All other components within normal limits  TROPONIN I - Abnormal; Notable for the following:    Troponin I 0.03 (*)    All other components within normal limits  URINALYSIS, ROUTINE W REFLEX MICROSCOPIC - Abnormal; Notable for the following:    Protein, ur 30 (*)    All other components within normal limits  BLOOD GAS, ARTERIAL - Abnormal; Notable for the following:    pO2, Arterial 44.6 (*)    All other components within normal limits  BASIC METABOLIC PANEL - Abnormal; Notable for the following:    Calcium 8.5 (*)    All other components within normal limits  TROPONIN I - Abnormal; Notable for the following:    Troponin I 0.03 (*)    All other components within normal limits  CBC WITH DIFFERENTIAL/PLATELET - Abnormal; Notable for the following:    RBC 4.00 (*)    Hemoglobin 10.6 (*)    HCT 34.0 (*)    RDW 16.8 (*)    All other components within  normal limits  BASIC METABOLIC PANEL - Abnormal; Notable for the following:    Creatinine, Ser 1.28 (*)    Calcium 8.6 (*)    All other components within normal limits  BASIC METABOLIC PANEL - Abnormal; Notable for the following:    BUN 22 (*)  Creatinine, Ser 1.33 (*)    Calcium 8.8 (*)    All other components within normal limits  BASIC METABOLIC PANEL - Abnormal; Notable for the following:    BUN 24 (*)    Creatinine, Ser 1.38 (*)    Calcium 8.7 (*)    GFR calc non Af Amer 59 (*)    All other components within normal limits  ETHANOL  RAPID URINE DRUG SCREEN, HOSP PERFORMED  MAGNESIUM  MAGNESIUM  MAGNESIUM    EKG  EKG Interpretation  Date/Time:  Monday April 19 2017 19:43:03 EDT Ventricular Rate:  105 PR Interval:    QRS Duration: 98 QT Interval:  385 QTC Calculation: 509 R Axis:   55 Text Interpretation:  Atrial fibrillation Nonspecific T abnormalities, lateral leads Prolonged QT interval since last tracing no significant change Confirmed by Mancel Bale [id number]) on 04/20/2017 4:15:58 PM       Radiology No results found.  Procedures Procedures (including critical care time)  Medications Ordered in ED Medications  levalbuterol (XOPENEX) nebulizer solution 1.25 mg (1.25 mg Nebulization Given 04/19/17 2248)  atorvastatin (LIPITOR) tablet 40 mg (40 mg Oral Given 04/22/17 1647)  carvedilol (COREG) tablet 25 mg (25 mg Oral Given 04/23/17 1107)  docusate sodium (COLACE) capsule 100 mg (not administered)  lisinopril (PRINIVIL,ZESTRIL) tablet 5 mg (5 mg Oral Given 04/23/17 1107)  ferrous gluconate (FERGON) tablet 324 mg (324 mg Oral Given 04/23/17 1107)  rivaroxaban (XARELTO) tablet 20 mg (20 mg Oral Given 04/22/17 1647)  pantoprazole (PROTONIX) EC tablet 40 mg (40 mg Oral Given 04/22/17 1647)  tamsulosin (FLOMAX) capsule 0.4 mg (0.4 mg Oral Given 04/23/17 1107)  ipratropium (ATROVENT) nebulizer solution 0.5 mg (not administered)  aspirin EC tablet 81 mg (81 mg Oral  Given 04/23/17 1107)  MEDLINE mouth rinse (15 mLs Mouth Rinse Given 04/23/17 1110)  potassium chloride SA (K-DUR,KLOR-CON) CR tablet 40 mEq (40 mEq Oral Given 04/23/17 1107)  budesonide (PULMICORT) nebulizer solution 0.25 mg (0.25 mg Inhalation Given 04/23/17 0735)  bumetanide (BUMEX) tablet 2 mg (2 mg Oral Given 04/23/17 1106)  furosemide (LASIX) injection 80 mg (80 mg Intravenous Given 04/19/17 2103)  potassium chloride SA (K-DUR,KLOR-CON) CR tablet 40 mEq (40 mEq Oral Given 04/19/17 2242)  morphine 4 MG/ML injection 4 mg (4 mg Intravenous Given 04/19/17 2243)  diphenhydrAMINE (BENADRYL) injection 25 mg (25 mg Intravenous Given 04/19/17 2242)  nitroGLYCERIN (NITROGLYN) 2 % ointment 1 inch (1 inch Topical Given 04/19/17 2242)  ipratropium (ATROVENT) nebulizer solution 0.5 mg (0.5 mg Nebulization Given 04/19/17 2248)  magnesium sulfate IVPB 2 g 50 mL (0 g Intravenous Stopped 04/20/17 0112)     Initial Impression / Assessment and Plan / ED Course  I have reviewed the triage vital signs and the nursing notes.  Pertinent labs & imaging results that were available during my care of the patient were reviewed by me and considered in my medical decision making (see chart for details).    Given dose of IV Lasix.  Discussed with hospitalist regarding admission for diuresis.  Final Clinical Impressions(s) / ED Diagnoses   Final diagnoses:  Chronic pulmonary edema    New Prescriptions Discharge Medication List as of 04/23/2017 12:57 PM       Loren Racer, MD 04/23/17 1322

## 2017-04-19 NOTE — ED Notes (Signed)
Pt O2 sats 78% on room air. Placed pt on O2 at 2l per [place]. Will continue to monitor.

## 2017-04-20 ENCOUNTER — Ambulatory Visit: Payer: Medicare Other | Admitting: Adult Health

## 2017-04-20 DIAGNOSIS — E7849 Other hyperlipidemia: Secondary | ICD-10-CM

## 2017-04-20 DIAGNOSIS — I482 Chronic atrial fibrillation: Secondary | ICD-10-CM | POA: Diagnosis not present

## 2017-04-20 DIAGNOSIS — M1990 Unspecified osteoarthritis, unspecified site: Secondary | ICD-10-CM | POA: Diagnosis present

## 2017-04-20 DIAGNOSIS — I10 Essential (primary) hypertension: Secondary | ICD-10-CM

## 2017-04-20 DIAGNOSIS — J811 Chronic pulmonary edema: Secondary | ICD-10-CM | POA: Diagnosis present

## 2017-04-20 DIAGNOSIS — E669 Obesity, unspecified: Secondary | ICD-10-CM | POA: Diagnosis present

## 2017-04-20 DIAGNOSIS — Z9981 Dependence on supplemental oxygen: Secondary | ICD-10-CM | POA: Diagnosis not present

## 2017-04-20 DIAGNOSIS — Z8711 Personal history of peptic ulcer disease: Secondary | ICD-10-CM | POA: Diagnosis not present

## 2017-04-20 DIAGNOSIS — R000 Tachycardia, unspecified: Secondary | ICD-10-CM | POA: Diagnosis present

## 2017-04-20 DIAGNOSIS — K219 Gastro-esophageal reflux disease without esophagitis: Secondary | ICD-10-CM

## 2017-04-20 DIAGNOSIS — J45909 Unspecified asthma, uncomplicated: Secondary | ICD-10-CM | POA: Diagnosis present

## 2017-04-20 DIAGNOSIS — Z6841 Body Mass Index (BMI) 40.0 and over, adult: Secondary | ICD-10-CM | POA: Diagnosis not present

## 2017-04-20 DIAGNOSIS — I252 Old myocardial infarction: Secondary | ICD-10-CM | POA: Diagnosis not present

## 2017-04-20 DIAGNOSIS — Z7982 Long term (current) use of aspirin: Secondary | ICD-10-CM | POA: Diagnosis not present

## 2017-04-20 DIAGNOSIS — Z87891 Personal history of nicotine dependence: Secondary | ICD-10-CM | POA: Diagnosis not present

## 2017-04-20 DIAGNOSIS — R748 Abnormal levels of other serum enzymes: Secondary | ICD-10-CM | POA: Diagnosis not present

## 2017-04-20 DIAGNOSIS — Z8249 Family history of ischemic heart disease and other diseases of the circulatory system: Secondary | ICD-10-CM | POA: Diagnosis not present

## 2017-04-20 DIAGNOSIS — R0602 Shortness of breath: Secondary | ICD-10-CM | POA: Diagnosis not present

## 2017-04-20 DIAGNOSIS — I248 Other forms of acute ischemic heart disease: Secondary | ICD-10-CM | POA: Diagnosis present

## 2017-04-20 DIAGNOSIS — Z888 Allergy status to other drugs, medicaments and biological substances status: Secondary | ICD-10-CM | POA: Diagnosis not present

## 2017-04-20 DIAGNOSIS — D649 Anemia, unspecified: Secondary | ICD-10-CM | POA: Diagnosis present

## 2017-04-20 DIAGNOSIS — Z7901 Long term (current) use of anticoagulants: Secondary | ICD-10-CM | POA: Diagnosis not present

## 2017-04-20 DIAGNOSIS — Z79899 Other long term (current) drug therapy: Secondary | ICD-10-CM | POA: Diagnosis not present

## 2017-04-20 DIAGNOSIS — Z9111 Patient's noncompliance with dietary regimen: Secondary | ICD-10-CM | POA: Diagnosis not present

## 2017-04-20 DIAGNOSIS — Z823 Family history of stroke: Secondary | ICD-10-CM | POA: Diagnosis not present

## 2017-04-20 DIAGNOSIS — I509 Heart failure, unspecified: Secondary | ICD-10-CM

## 2017-04-20 DIAGNOSIS — I5023 Acute on chronic systolic (congestive) heart failure: Secondary | ICD-10-CM | POA: Diagnosis not present

## 2017-04-20 DIAGNOSIS — G4733 Obstructive sleep apnea (adult) (pediatric): Secondary | ICD-10-CM | POA: Diagnosis present

## 2017-04-20 DIAGNOSIS — E785 Hyperlipidemia, unspecified: Secondary | ICD-10-CM | POA: Diagnosis present

## 2017-04-20 DIAGNOSIS — I110 Hypertensive heart disease with heart failure: Secondary | ICD-10-CM | POA: Diagnosis present

## 2017-04-20 LAB — CBC WITH DIFFERENTIAL/PLATELET
BASOS PCT: 0 %
Basophils Absolute: 0 10*3/uL (ref 0.0–0.1)
EOS ABS: 0.1 10*3/uL (ref 0.0–0.7)
Eosinophils Relative: 3 %
HEMATOCRIT: 34 % — AB (ref 39.0–52.0)
HEMOGLOBIN: 10.6 g/dL — AB (ref 13.0–17.0)
LYMPHS ABS: 1 10*3/uL (ref 0.7–4.0)
Lymphocytes Relative: 25 %
MCH: 26.5 pg (ref 26.0–34.0)
MCHC: 31.2 g/dL (ref 30.0–36.0)
MCV: 85 fL (ref 78.0–100.0)
MONOS PCT: 9 %
Monocytes Absolute: 0.4 10*3/uL (ref 0.1–1.0)
NEUTROS ABS: 2.5 10*3/uL (ref 1.7–7.7)
NEUTROS PCT: 63 %
PLATELETS: 225 10*3/uL (ref 150–400)
RBC: 4 MIL/uL — AB (ref 4.22–5.81)
RDW: 16.8 % — AB (ref 11.5–15.5)
WBC: 4 10*3/uL (ref 4.0–10.5)

## 2017-04-20 LAB — BASIC METABOLIC PANEL
ANION GAP: 5 (ref 5–15)
BUN: 16 mg/dL (ref 6–20)
CO2: 29 mmol/L (ref 22–32)
Calcium: 8.5 mg/dL — ABNORMAL LOW (ref 8.9–10.3)
Chloride: 105 mmol/L (ref 101–111)
Creatinine, Ser: 1.11 mg/dL (ref 0.61–1.24)
GFR calc non Af Amer: >60 mL/min (ref >60)
Glucose, Bld: 89 mg/dL (ref 65–99)
POTASSIUM: 3.8 mmol/L (ref 3.5–5.1)
Sodium: 139 mmol/L (ref 135–145)

## 2017-04-20 LAB — TROPONIN I: Troponin I: 0.03 ng/mL (ref <0.03)

## 2017-04-20 MED ORDER — ORAL CARE MOUTH RINSE
15.0000 mL | Freq: Two times a day (BID) | OROMUCOSAL | Status: DC
  Administered 2017-04-20 – 2017-04-23 (×3): 15 mL via OROMUCOSAL

## 2017-04-20 MED ORDER — POTASSIUM CHLORIDE CRYS ER 20 MEQ PO TBCR
40.0000 meq | EXTENDED_RELEASE_TABLET | Freq: Two times a day (BID) | ORAL | Status: DC
  Administered 2017-04-20 – 2017-04-23 (×6): 40 meq via ORAL
  Filled 2017-04-20 (×6): qty 2

## 2017-04-20 MED ORDER — FUROSEMIDE 10 MG/ML IJ SOLN
40.0000 mg | Freq: Two times a day (BID) | INTRAMUSCULAR | Status: DC
  Administered 2017-04-20 – 2017-04-22 (×4): 40 mg via INTRAVENOUS
  Filled 2017-04-20 (×4): qty 4

## 2017-04-20 MED ORDER — BUDESONIDE 0.25 MG/2ML IN SUSP
0.2500 mg | Freq: Two times a day (BID) | RESPIRATORY_TRACT | Status: DC
  Administered 2017-04-21 – 2017-04-23 (×5): 0.25 mg via RESPIRATORY_TRACT
  Filled 2017-04-20 (×5): qty 2

## 2017-04-20 NOTE — Progress Notes (Signed)
Patient does not want to wear CPAP at night, wants to just wear the nasal cannula. No unit in room.

## 2017-04-20 NOTE — Progress Notes (Addendum)
PROGRESS NOTE    [name]  STM:[id number]  DOB: 1965-11-15  DOA: 04/19/2017 PCP: Pearson Grippe, MD   Brief Admission Hx: [name] is a 51 y.o. male with medical history significant of allergic rhinitis, asthma, mitral fibrillation, chronic systolic CHF, essential hypertension, GERD, PUD, history of MI with normal coronaries in December 2016, treatment noncompliance, OSA not on CPAP, osteoarthritis who was brought to the emergency department via EMS due to progressively worse dyspnea, dry cough, sore throat, wheezing, worsening lower extremity edema, PND and orthopnea for the past 2-3 days.   MDM/Assessment & Plan:   1. Acute exacerbation of chronic systolic CHF - Pt is diuresing well on IV lasix, down 3Liters this morning, will reduce IV lasix to 40 mg, monitor I/O, fluid restrict, monitor electrolytes closely, daily weights.  2. Chronic Atrial Fibrillation - resume Xarelto for anticoagulation, metoprolol for rate control.  3. Dyslipidemia - resume atorvastatin 40 mg daily.  4. Essential Hypertension - resume home BP meds.  Currently controlled.  5. Asthma - stable, controlled, xopenex/atrovent ordered as needed.  6. GERD - protonix ordered for GI protection.  7. Normocytic anemia - following.   8. OSA - nightly CPAP ordered.   DVT prophylaxis: Xarelto Code Status: Full  Family Communication: none present Disposition Plan: anticipating Home in 1-2 days   Subjective: Pt says that he is still having some SOB but may be somewhat improved.   Objective: Vitals:   04/19/17 2336 04/19/17 2342 04/20/17 0500 04/20/17 0625  BP: (!) 157/110   132/89  Pulse: 92   80  Resp: 20   20  Temp: 98.3 F (36.8 C)   98.2 F (36.8 C)  TempSrc: Oral   Oral  SpO2: 100% 100%  100%  Weight: (!) 157.8 kg (347 lb 14.2 oz)  (!) 157.8 kg (347 lb 14.2 oz)   Height: 6' (1.829 m)       Intake/Output Summary (Last 24 hours) at 04/20/17 1007 Last data filed at 04/20/17 0939  Gross per 24 hour    Intake              240 ml  Output             3300 ml  Net            -3060 ml   Filed Weights   04/19/17 1939 04/19/17 2336 04/20/17 0500  Weight: (!) 152.4 kg (336 lb) (!) 157.8 kg (347 lb 14.2 oz) (!) 157.8 kg (347 lb 14.2 oz)   REVIEW OF SYSTEMS  As per history otherwise all reviewed and reported negative  Exam:  General exam: awake, alert, NAD. Cooperative, morbidly obese male  Respiratory system: shallow BS bilateral, no rales or rhonchi heard, no wheezing.  No increased work of breathing. Cardiovascular system: normal S1 & S2 heard.  Gastrointestinal system: Abdomen is nondistended, soft and nontender. Normal bowel sounds heard. Central nervous system: Alert and oriented. No focal neurological deficits. Extremities: 2+ edema bilateral LEs.  Data Reviewed: Basic Metabolic Panel:  Recent Labs Lab 04/19/17 2009 04/20/17 0455  NA 136 139  K 3.5 3.8  CL 102 105  CO2 28 29  GLUCOSE 97 89  BUN 18 16  CREATININE 1.20 1.11  CALCIUM 8.5* 8.5*  MG 2.0  --    Liver Function Tests:  Recent Labs Lab 04/19/17 2009  AST 23  ALT 17  ALKPHOS 94  BILITOT 1.2  PROT 6.5  ALBUMIN 3.6   No results for input(s):  LIPASE, AMYLASE in the last 168 hours. No results for input(s): AMMONIA in the last 168 hours. CBC:  Recent Labs Lab 04/20/17 0455  WBC 4.0  NEUTROABS 2.5  HGB 10.6*  HCT 34.0*  MCV 85.0  PLT 225   Cardiac Enzymes:  Recent Labs Lab 04/19/17 2009 04/20/17 0455  TROPONINI 0.03* 0.03*   CBG (last 3)  No results for input(s): GLUCAP in the last 72 hours. No results found for this or any previous visit (from the past 240 hour(s)).   Studies: Dg Chest Port 1 View  Result Date: 04/19/2017 CLINICAL DATA:  Shortness of breath, swelling EXAM: PORTABLE CHEST 1 VIEW COMPARISON:  04/16/2017 FINDINGS: Cardiomegaly with pulmonary vascular congestion. Very mild interstitial edema is possible. No definite pleural effusions. No pneumothorax. IMPRESSION:  Cardiomegaly with pulmonary vascular congestion and possible mild interstitial edema. Electronically Signed   By: Charline BillsSriyesh  Krishnan M.D.   On: 04/19/2017 20:26   Scheduled Meds: . aspirin EC  81 mg Oral Daily  . atorvastatin  40 mg Oral q1800  . budesonide  0.25 mg Inhalation BID  . carvedilol  25 mg Oral BID WC  . ferrous gluconate  324 mg Oral Q breakfast  . furosemide  40 mg Intravenous BID  . lisinopril  5 mg Oral Daily  . mouth rinse  15 mL Mouth Rinse BID  . pantoprazole  40 mg Oral QPM  . potassium chloride SA  20 mEq Oral BID  . rivaroxaban  20 mg Oral Q supper  . tamsulosin  0.4 mg Oral Daily   Continuous Infusions:  Principal Problem:   Acute on chronic systolic (congestive) heart failure (HCC) Active Problems:   Hyperlipidemia   Essential hypertension   Asthma   GERD (gastroesophageal reflux disease)   Chronic atrial fibrillation (HCC)   Normocytic anemia   Elevated troponin   Acute exacerbation of CHF (congestive heart failure) (HCC)  Time spent:   Standley Dakinslanford Johnson, MD, FAAFP [facility] Pager [phone]  If 7PM-7AM, please contact night-coverage [email or web address] Password [email or web address] 04/20/2017, 10:07 AM    LOS: 0 days

## 2017-04-21 ENCOUNTER — Inpatient Hospital Stay (HOSPITAL_COMMUNITY): Payer: Medicare Other

## 2017-04-21 DIAGNOSIS — I5023 Acute on chronic systolic (congestive) heart failure: Secondary | ICD-10-CM

## 2017-04-21 LAB — BASIC METABOLIC PANEL
ANION GAP: 9 (ref 5–15)
BUN: 20 mg/dL (ref 6–20)
CALCIUM: 8.6 mg/dL — AB (ref 8.9–10.3)
CO2: 28 mmol/L (ref 22–32)
Chloride: 104 mmol/L (ref 101–111)
Creatinine, Ser: 1.28 mg/dL — ABNORMAL HIGH (ref 0.61–1.24)
Glucose, Bld: 88 mg/dL (ref 65–99)
Potassium: 3.7 mmol/L (ref 3.5–5.1)
Sodium: 141 mmol/L (ref 135–145)

## 2017-04-21 LAB — MAGNESIUM: Magnesium: 2 mg/dL (ref 1.7–2.4)

## 2017-04-21 NOTE — Care Management Note (Signed)
Case Management Note  Patient Details  Name: [name] MRN: [id number] Date of Birth: [birth year]  Subjective/Objective:     Admitted with CHF. Pt lives with mother, non compliant with medications and diet.  Pt has home oxygen, is ind with ADL's. He has transportation to appointments. Reports that a nurse is making home visits, but he is unsure who nurse is affiliated with, ? HH or [facility] RN. Discussed patient's recent conversation with [facility] rep, [name]. Patient reports he is interested in following up with [name] after DC. [facility] willing to provide IV lasix at home for patient.          Action/Plan: Anticipate DC home with referral to [facility]. CM following.    Expected Discharge Date:      04/22/2017            Expected Discharge Plan:  Home/Self Care  In-House Referral:  NA  Discharge planning Services  CM Consult  Post Acute Care Choice:  Hospice Choice offered to:  Patient  DME Arranged:    DME Agency:     HH Arranged:    HH Agency:     Status of Service:  In process, will continue to follow  If discussed at Long Length of Stay Meetings, dates discussed:    Additional Comments:  [name], Samuel Oiler, RN 04/21/2017, 2:15 PM

## 2017-04-21 NOTE — Progress Notes (Signed)
Patient refusing CPAP, no unit in room. 

## 2017-04-21 NOTE — Progress Notes (Signed)
PROGRESS NOTE    Samuel DICKER  IEP:[id number] DOB: 1966/02/14 DOA: 04/19/2017 PCP: Pearson Grippe, MD     Brief Narrative:  51 year old man well-known to our service for frequent readmissions for acute on chronic systolic heart failure due to dietary and medication noncompliance.  He was admitted to the hospital on 10/29 with the same.   Assessment & Plan:   Principal Problem:   Acute on chronic systolic (congestive) heart failure (HCC) Active Problems:   Hyperlipidemia   Essential hypertension   Asthma   GERD (gastroesophageal reflux disease)   Chronic atrial fibrillation (HCC)   Normocytic anemia   Elevated troponin   Acute exacerbation of CHF (congestive heart failure) (HCC)   Acute on chronic systolic heart failure -Volume status continues to improve, he is 5.4 L negative since admission, plan to continue Lasix 40 mg IV twice daily for now. -Watch creatinine closely as it is starting to increase but still at his baseline. -Continue beta-blocker, ACE inhibitor.  Chronic atrial fibrillation -Currently rate controlled on metoprolol, continue Xarelto for anticoagulation purposes.  Hyperlipidemia -Continue atorvastatin.  Hypertension -Well-controlled.  GERD -On Protonix.   DVT prophylaxis: Xarelto Code Status: Full code Family Communication: Patient only Disposition Plan: Anticipate discharge home in 48-72 hours once volume status improves  Consultants:   None  Procedures:   None  Antimicrobials:  Anti-infectives    None       Subjective: Feels like his shortness of breath has improved, he tells me that his legs still feel tight  Objective: Vitals:   04/20/17 2009 04/20/17 2100 04/21/17 0500 04/21/17 1036  BP:  108/74 138/88   Pulse:  84 84   Resp:  20 20   Temp:  98.1 F (36.7 C) 98.1 F (36.7 C)   TempSrc:  Oral Oral   SpO2: 93% 94% 98% 100%  Weight:   (!) 154.3 kg (340 lb 1.6 oz)   Height:        Intake/Output Summary (Last 24 hours)  at 04/21/17 1651 Last data filed at 04/21/17 1300  Gross per 24 hour  Intake              720 ml  Output             3650 ml  Net            -2930 ml   Filed Weights   04/19/17 2336 04/20/17 0500 04/21/17 0500  Weight: (!) 157.8 kg (347 lb 14.2 oz) (!) 157.8 kg (347 lb 14.2 oz) (!) 154.3 kg (340 lb 1.6 oz)    Examination:  General exam: Alert, awake, oriented x 3 Respiratory system: Clear to auscultation. Respiratory effort normal. Cardiovascular system:RRR. No murmurs, rubs, gallops. Gastrointestinal system: Abdomen is nondistended, soft and nontender. No organomegaly or masses felt. Normal bowel sounds heard. Central nervous system: Alert and oriented. No focal neurological deficits. Extremities: 3+ pitting edema bilaterally Skin: No rashes, lesions or ulcers Psychiatry: Judgement and insight appear normal. Mood & affect appropriate.     Data Reviewed: I have personally reviewed following labs and imaging studies  CBC:  Recent Labs Lab 04/20/17 0455  WBC 4.0  NEUTROABS 2.5  HGB 10.6*  HCT 34.0*  MCV 85.0  PLT 225   Basic Metabolic Panel:  Recent Labs Lab 04/19/17 2009 04/20/17 0455 04/21/17 0410  NA 136 139 141  K 3.5 3.8 3.7  CL 102 105 104  CO2 28 29 28   GLUCOSE 97 89 88  BUN 18 16 20  CREATININE 1.20 1.11 1.28*  CALCIUM 8.5* 8.5* 8.6*  MG 2.0  --  2.0   GFR: Estimated Creatinine Clearance: 108.1 mL/min (A) (by C-G formula based on SCr of 1.28 mg/dL (H)). Liver Function Tests:  Recent Labs Lab 04/19/17 2009  AST 23  ALT 17  ALKPHOS 94  BILITOT 1.2  PROT 6.5  ALBUMIN 3.6   No results for input(s): LIPASE, AMYLASE in the last 168 hours. No results for input(s): AMMONIA in the last 168 hours. Coagulation Profile: No results for input(s): INR, PROTIME in the last 168 hours. Cardiac Enzymes:  Recent Labs Lab 04/19/17 2009 04/20/17 0455  TROPONINI 0.03* 0.03*   BNP (last 3 results) No results for input(s): PROBNP in the last 8760  hours. HbA1C: No results for input(s): HGBA1C in the last 72 hours. CBG: No results for input(s): GLUCAP in the last 168 hours. Lipid Profile: No results for input(s): CHOL, HDL, LDLCALC, TRIG, CHOLHDL, LDLDIRECT in the last 72 hours. Thyroid Function Tests: No results for input(s): TSH, T4TOTAL, FREET4, T3FREE, THYROIDAB in the last 72 hours. Anemia Panel: No results for input(s): VITAMINB12, FOLATE, FERRITIN, TIBC, IRON, RETICCTPCT in the last 72 hours. Urine analysis:    Component Value Date/Time   COLORURINE YELLOW 04/19/2017 2030   APPEARANCEUR CLEAR 04/19/2017 2030   LABSPEC 1.023 04/19/2017 2030   PHURINE 5.0 04/19/2017 2030   GLUCOSEU NEGATIVE 04/19/2017 2030   GLUCOSEU NEG mg/dL 16/10/960408/10/2009 [id number]   HGBUR NEGATIVE 04/19/2017 2030   BILIRUBINUR NEGATIVE 04/19/2017 2030   KETONESUR NEGATIVE 04/19/2017 2030   PROTEINUR 30 (A) 04/19/2017 2030   UROBILINOGEN 0.2 08/08/2014 1445   NITRITE NEGATIVE 04/19/2017 2030   LEUKOCYTESUR NEGATIVE 04/19/2017 2030   Sepsis Labs: @LABRCNTIP (procalcitonin:4,lacticidven:4)  )No results found for this or any previous visit (from the past 240 hour(s)).       Radiology Studies: Dg Chest Port 1 View  Result Date: 04/21/2017 CLINICAL DATA:  Shortness of Breath EXAM: PORTABLE CHEST 1 VIEW COMPARISON:  April 19, 2017 FINDINGS: There is stable cardiomegaly with pulmonary venous hypertension. There is no frank edema or consolidation. No adenopathy appreciable. No bone lesions. No appreciable pleural effusion. IMPRESSION: Persistent pulmonary vascular congestion without edema or consolidation evident. Electronically Signed   By: Bretta BangWilliam  Woodruff III M.D.   On: 04/21/2017 07:38   Dg Chest Port 1 View  Result Date: 04/19/2017 CLINICAL DATA:  Shortness of breath, swelling EXAM: PORTABLE CHEST 1 VIEW COMPARISON:  04/16/2017 FINDINGS: Cardiomegaly with pulmonary vascular congestion. Very mild interstitial edema is possible. No definite pleural  effusions. No pneumothorax. IMPRESSION: Cardiomegaly with pulmonary vascular congestion and possible mild interstitial edema. Electronically Signed   By: Charline BillsSriyesh  Krishnan M.D.   On: 04/19/2017 20:26        Scheduled Meds: . aspirin EC  81 mg Oral Daily  . atorvastatin  40 mg Oral q1800  . budesonide  0.25 mg Inhalation BID  . carvedilol  25 mg Oral BID WC  . ferrous gluconate  324 mg Oral Q breakfast  . furosemide  40 mg Intravenous BID  . lisinopril  5 mg Oral Daily  . mouth rinse  15 mL Mouth Rinse BID  . pantoprazole  40 mg Oral QPM  . potassium chloride SA  40 mEq Oral BID  . rivaroxaban  20 mg Oral Q supper  . tamsulosin  0.4 mg Oral Daily   Continuous Infusions:   LOS: 1 day    Time spent: 35 minutes. Greater than 50% of this time was  spent in direct contact with the patient coordinating care.     Chaya Jan, MD [facility] Pager [phone]  If 7PM-7AM, please contact night-coverage [email or web address] Password [email or web address] 04/21/2017, 4:51 PM

## 2017-04-22 LAB — BASIC METABOLIC PANEL
ANION GAP: 7 (ref 5–15)
BUN: 22 mg/dL — AB (ref 6–20)
CHLORIDE: 105 mmol/L (ref 101–111)
CO2: 29 mmol/L (ref 22–32)
Calcium: 8.8 mg/dL — ABNORMAL LOW (ref 8.9–10.3)
Creatinine, Ser: 1.33 mg/dL — ABNORMAL HIGH (ref 0.61–1.24)
Glucose, Bld: 94 mg/dL (ref 65–99)
POTASSIUM: 4.2 mmol/L (ref 3.5–5.1)
SODIUM: 141 mmol/L (ref 135–145)

## 2017-04-22 LAB — MAGNESIUM: Magnesium: 2.1 mg/dL (ref 1.7–2.4)

## 2017-04-22 MED ORDER — BUMETANIDE 1 MG PO TABS
2.0000 mg | ORAL_TABLET | Freq: Two times a day (BID) | ORAL | Status: DC
  Administered 2017-04-22 – 2017-04-23 (×2): 2 mg via ORAL
  Filled 2017-04-22 (×2): qty 2

## 2017-04-22 NOTE — Progress Notes (Signed)
PROGRESS NOTE    [name]  ZOX:[id number] DOB: 09/16/1965 DOA: 04/19/2017 PCP: Pearson GrippeKim, [name], MD     Brief Narrative:  51 year old man well-known to our service for frequent readmissions for acute on chronic systolic heart failure due to dietary and medication noncompliance.  He was admitted to the hospital on 10/29 with the same.   Assessment & Plan:   Principal Problem:   Acute on chronic systolic (congestive) heart failure (HCC) Active Problems:   Hyperlipidemia   Essential hypertension   Asthma   GERD (gastroesophageal reflux disease)   Chronic atrial fibrillation (HCC)   Normocytic anemia   Elevated troponin   Acute exacerbation of CHF (congestive heart failure) (HCC)   Acute on chronic systolic heart failure -Volume status continues to improve, he is 10.2 L negative since admission. -Will DC IV lasix and place back on home dose Bumex. -Watch creatinine closely as it is starting to increase but still at his baseline. -Continue beta-blocker, ACE inhibitor. -Likely DC in 24 hours.  Chronic atrial fibrillation -Currently rate controlled on metoprolol, continue Xarelto for anticoagulation purposes.  Hyperlipidemia -Continue atorvastatin.  Hypertension -Well-controlled.  GERD -On Protonix.   DVT prophylaxis: Xarelto Code Status: Full code Family Communication: Patient only Disposition Plan: Anticipate discharge home in 24-48 hours once volume status improves and if Cr remains stable. There is a ? of hospice following on DC.  Consultants:   None  Procedures:   None  Antimicrobials:  Anti-infectives    None       Subjective: Feels better, no issues overnight.  Objective: Vitals:   04/21/17 2024 04/21/17 2039 04/22/17 0650 04/22/17 0900  BP:  130/88 (!) 142/89   Pulse:  67 78   Resp:  20 20   Temp:  (!) 97.5 F (36.4 C) (!) 100.6 F (38.1 C)   TempSrc:  Oral Oral   SpO2: 99% 100% 97% 95%  Weight:   (!) 154.2 kg (339 lb 15.2 oz)     Height:        Intake/Output Summary (Last 24 hours) at 04/22/17 1545 Last data filed at 04/22/17 1255  Gross per 24 hour  Intake              480 ml  Output             3075 ml  Net            -2595 ml   Filed Weights   04/20/17 0500 04/21/17 0500 04/22/17 0650  Weight: (!) 157.8 kg (347 lb 14.2 oz) (!) 154.3 kg (340 lb 1.6 oz) (!) 154.2 kg (339 lb 15.2 oz)    Examination:  General exam: Alert, awake, oriented x 3 Respiratory system: Clear to auscultation. Respiratory effort normal. Cardiovascular system:RRR. No murmurs, rubs, gallops. Gastrointestinal system: Abdomen is nondistended, soft and nontender. No organomegaly or masses felt. Normal bowel sounds heard. Central nervous system: Alert and oriented. No focal neurological deficits. Extremities: 1-2+ edema bilaterally with thickened skin Skin: No rashes, lesions or ulcers Psychiatry:  Mood & affect appropriate.      Data Reviewed: I have personally reviewed following labs and imaging studies  CBC:  Recent Labs Lab 04/20/17 0455  WBC 4.0  NEUTROABS 2.5  HGB 10.6*  HCT 34.0*  MCV 85.0  PLT 225   Basic Metabolic Panel:  Recent Labs Lab 04/19/17 2009 04/20/17 0455 04/21/17 0410 04/22/17 0427  NA 136 139 141 141  K 3.5 3.8 3.7 4.2  CL 102 105 104 105  CO2 28 29 28 29   GLUCOSE 97 89 88 94  BUN 18 16 20  22*  CREATININE 1.20 1.11 1.28* 1.33*  CALCIUM 8.5* 8.5* 8.6* 8.8*  MG 2.0  --  2.0 2.1   GFR: Estimated Creatinine Clearance: 104 mL/min (A) (by C-G formula based on SCr of 1.33 mg/dL (H)). Liver Function Tests:  Recent Labs Lab 04/19/17 2009  AST 23  ALT 17  ALKPHOS 94  BILITOT 1.2  PROT 6.5  ALBUMIN 3.6   No results for input(s): LIPASE, AMYLASE in the last 168 hours. No results for input(s): AMMONIA in the last 168 hours. Coagulation Profile: No results for input(s): INR, PROTIME in the last 168 hours. Cardiac Enzymes:  Recent Labs Lab 04/19/17 2009 04/20/17 0455  TROPONINI 0.03*  0.03*   BNP (last 3 results) No results for input(s): PROBNP in the last 8760 hours. HbA1C: No results for input(s): HGBA1C in the last 72 hours. CBG: No results for input(s): GLUCAP in the last 168 hours. Lipid Profile: No results for input(s): CHOL, HDL, LDLCALC, TRIG, CHOLHDL, LDLDIRECT in the last 72 hours. Thyroid Function Tests: No results for input(s): TSH, T4TOTAL, FREET4, T3FREE, THYROIDAB in the last 72 hours. Anemia Panel: No results for input(s): VITAMINB12, FOLATE, FERRITIN, TIBC, IRON, RETICCTPCT in the last 72 hours. Urine analysis:    Component Value Date/Time   COLORURINE YELLOW 04/19/2017 2030   APPEARANCEUR CLEAR 04/19/2017 2030   LABSPEC 1.023 04/19/2017 2030   PHURINE 5.0 04/19/2017 2030   GLUCOSEU NEGATIVE 04/19/2017 2030   GLUCOSEU NEG mg/dL 16/03/9603 5409   HGBUR NEGATIVE 04/19/2017 2030   BILIRUBINUR NEGATIVE 04/19/2017 2030   KETONESUR NEGATIVE 04/19/2017 2030   PROTEINUR 30 (A) 04/19/2017 2030   UROBILINOGEN 0.2 08/08/2014 1445   NITRITE NEGATIVE 04/19/2017 2030   LEUKOCYTESUR NEGATIVE 04/19/2017 2030   Sepsis Labs: @LABRCNTIP (procalcitonin:4,lacticidven:4)  )No results found for this or any previous visit (from the past 240 hour(s)).       Radiology Studies: Dg Chest Port 1 View  Result Date: 04/21/2017 CLINICAL DATA:  Shortness of Breath EXAM: PORTABLE CHEST 1 VIEW COMPARISON:  April 19, 2017 FINDINGS: There is stable cardiomegaly with pulmonary venous hypertension. There is no frank edema or consolidation. No adenopathy appreciable. No bone lesions. No appreciable pleural effusion. IMPRESSION: Persistent pulmonary vascular congestion without edema or consolidation evident. Electronically Signed   By: Bretta Bang III M.D.   On: 04/21/2017 07:38        Scheduled Meds: . aspirin EC  81 mg Oral Daily  . atorvastatin  40 mg Oral q1800  . budesonide  0.25 mg Inhalation BID  . carvedilol  25 mg Oral BID WC  . ferrous gluconate   324 mg Oral Q breakfast  . furosemide  40 mg Intravenous BID  . lisinopril  5 mg Oral Daily  . mouth rinse  15 mL Mouth Rinse BID  . pantoprazole  40 mg Oral QPM  . potassium chloride SA  40 mEq Oral BID  . rivaroxaban  20 mg Oral Q supper  . tamsulosin  0.4 mg Oral Daily   Continuous Infusions:   LOS: 2 days    Time spent: 35 minutes. Greater than 50% of this time was spent in direct contact with the patient coordinating care.     Chaya Jan, MD [facility] Pager [phone]  If 7PM-7AM, please contact night-coverage [email or web address] Password [email or web address] 04/22/2017, 3:45 PM

## 2017-04-22 NOTE — Progress Notes (Signed)
Patient not using CPAP, no unit in room.

## 2017-04-23 LAB — BASIC METABOLIC PANEL
Anion gap: 8 (ref 5–15)
BUN: 24 mg/dL — ABNORMAL HIGH (ref 6–20)
CO2: 28 mmol/L (ref 22–32)
Calcium: 8.7 mg/dL — ABNORMAL LOW (ref 8.9–10.3)
Chloride: 106 mmol/L (ref 101–111)
Creatinine, Ser: 1.38 mg/dL — ABNORMAL HIGH (ref 0.61–1.24)
GFR calc Af Amer: >60 mL/min (ref >60)
GFR calc non Af Amer: 59 mL/min — ABNORMAL LOW (ref >60)
GLUCOSE: 92 mg/dL (ref 65–99)
Potassium: 3.9 mmol/L (ref 3.5–5.1)
SODIUM: 142 mmol/L (ref 135–145)

## 2017-04-23 NOTE — Discharge Summary (Signed)
Physician Discharge Summary  [name]:[id number] DOB: 13-Jan-1966 DOA: 04/19/2017  PCP: Pearson Grippe, MD  Admit date: 04/19/2017 Discharge date: 04/23/2017  Time spent: 45 minutes  Recommendations for Outpatient Follow-up:  -Will be discharged home today. -Hospice will continue to follow at home.  Discharge Diagnoses:  Principal Problem:   Acute on chronic systolic (congestive) heart failure (HCC) Active Problems:   Hyperlipidemia   Essential hypertension   Asthma   GERD (gastroesophageal reflux disease)   Chronic atrial fibrillation (HCC)   Normocytic anemia   Elevated troponin   Acute exacerbation of CHF (congestive heart failure) (HCC)   Discharge Condition: Stable and improved  Filed Weights   04/21/17 0500 04/22/17 0650 04/23/17 0657  Weight: (!) 154.3 kg (340 lb 1.6 oz) (!) 154.2 kg (339 lb 15.2 oz) (!) 151 kg (332 lb 14.3 oz)    History of present illness:  As per Dr. Robb Matar 10/29: [name] is a 51 y.o. male with medical history significant of allergic rhinitis, asthma, mitral fibrillation, chronic systolic CHF, essential hypertension, GERD, PUD, history of MI with normal coronaries in December 2016, treatment noncompliance, OSA not on CPAP, osteoarthritis who was brought to the emergency department via EMS due to progressively worse dyspnea, dry cough, sore throat, wheezing, worsening lower extremity edema, PND and orthopnea for the past 2-3 days.   He was recently admitted from 10/21 to 04/13/2017 to this facility due to acute on chronic systolic CHF. After discharge, he states that he was seen at [facility], given IV Lasix and discharged home 2 days ago. The patient states that he has been compliant with fluid/sodium intake and has been using his medications as prescribed by the physicians.  EMS reported that the patient was initially drowsy, but that has resolved after the patient has been given treatment in the emergency department. He denies  headache, rhinorrhea, chest pain, dizziness, palpitations, diaphoresis, abdominal pain, nausea, emesis, diarrhea, constipation, melena or hematochezia. He denies dysuria, frequency or hematuria. Denies polyphagia, polydipsia, polyuria or blurred vision. He complains of occasional pruritus of the lower extremities.  ED Course: Initial vital signs temperature 98.82F, pulse 93, respirations 22, blood pressure 165/135 mmHg and O2 sat 92% on room air.  His workup shows an unremarkable CMP, negative urine toxicology, urinary analysis with minimal proteinuria, normal alcohol, mildly elevated troponin of 0.03 ng/mL and a BNP of 509 pg/mL. His EKG shows atrial fibrillation with RVR with a ventricular rate of 105 BPM and nonspecific T-wave abnormalities. Not significantly changed from previous. ABG showed a pH of 7.36, PCO2 of 48, all other values were unremarkable except for a PCO2 of 44.6 mmHg. His chest radiograph showed cardiomegaly and pulmonary vascular congestion. Please see image and radiology report for further detail.  Medications in ED: He was provided supplemental oxygen and furosemide 80 mg IVP 1 dose. I also ordered nitro paste 2% 1 inch patch, K Dur 40 mEq by mouth 1 dose, Benadryl 25 mg IVP 1 dose for lower extremities pruritus, morphine 4 mg IVP 1 dose for bilateral knee pain and acute CHF and Xopenex 1.25+ Atrovent 0.5 mg twice a day and nebulizer 1. The patient stated that he feels better after this treatment.  Hospital Course:   Acute on chronic systolic heart failure -Volume status continues to improve, he is 11.7 L negative since admission. -Diuresed well overnight on home dose of Bumex. -Creatinine remains at baseline postop -Continue beta-blocker, ACE inhibitor.  Chronic atrial fibrillation -Currently rate controlled on  metoprolol, continue Xarelto for anticoagulation purposes.  Hyperlipidemia -Continue atorvastatin.  Hypertension -Well-controlled.  GERD -On  Protonix.   Procedures:  None  Consultations:  None  Discharge Instructions  Discharge Instructions    Diet - low sodium heart healthy    Complete by:  As directed    Increase activity slowly    Complete by:  As directed      Allergies as of 04/23/2017      Reactions   Banana Shortness Of Breath   Bee Venom Shortness Of Breath, Swelling, Other (See Comments)   Reaction:  Facial swelling   Food Shortness Of Breath, Rash, Other (See Comments)   Pt states that he is allergic to strawberries.     Aspirin Other (See Comments)   Reaction:  GI upset    Metolazone Other (See Comments)   Pt states that he stopped taking this med due to heart attack like symptoms.    Oatmeal Nausea And Vomiting   Orange Juice [orange Oil] Nausea And Vomiting   All acidic products make him nauseous and upset stomach   Torsemide Swelling, Other (See Comments)   Reaction:  Swelling of feet/legs    Diltiazem Palpitations   Hydralazine Palpitations   Lipitor [atorvastatin] Other (See Comments)   Reaction:  Nose bleeds       Medication List    TAKE these medications   albuterol (2.5 MG/3ML) 0.083% nebulizer solution Commonly known as:  PROVENTIL Take 2.5 mg by nebulization every 6 (six) hours as needed for wheezing or shortness of breath.   aspirin 81 MG chewable tablet Chew 1 tablet (81 mg total) by mouth daily.   atorvastatin 40 MG tablet Commonly known as:  LIPITOR Take 1 tablet (40 mg total) by mouth daily at 6 PM.   beclomethasone 80 MCG/ACT inhaler Commonly known as:  QVAR Inhale 2 puffs into the lungs 2 (two) times daily.   bumetanide 2 MG tablet Commonly known as:  BUMEX Take 1 tablet (2 mg total) by mouth 2 (two) times daily.   carvedilol 25 MG tablet Commonly known as:  COREG Take 1 tablet (25 mg total) by mouth 2 (two) times daily with a meal.   docusate sodium 100 MG capsule Commonly known as:  COLACE Take 1 capsule (100 mg total) by mouth 2 (two) times daily. What  changed:  when to take this  reasons to take this   ferrous gluconate 324 MG tablet Commonly known as:  FERGON Take 1 tablet (324 mg total) by mouth 2 (two) times daily with a meal. What changed:  when to take this   lisinopril 5 MG tablet Commonly known as:  PRINIVIL,ZESTRIL Take 1 tablet (5 mg total) by mouth daily.   OXYGEN Inhale 3.5 L into the lungs daily.   pantoprazole 40 MG tablet Commonly known as:  PROTONIX Take 1 tablet (40 mg total) by mouth daily at 6 (six) AM. What changed:  when to take this   potassium chloride SA 20 MEQ tablet Commonly known as:  K-DUR,KLOR-CON Take 1 tablet (20 mEq total) by mouth daily.   rivaroxaban 20 MG Tabs tablet Commonly known as:  XARELTO Take 1 tablet (20 mg total) by mouth daily with breakfast. What changed:  when to take this   tamsulosin 0.4 MG Caps capsule Commonly known as:  FLOMAX Take 1 capsule (0.4 mg total) by mouth daily.      Allergies  Allergen Reactions  . Banana Shortness Of Breath  . Bee Venom Shortness  Of Breath, Swelling and Other (See Comments)    Reaction:  Facial swelling  . Food Shortness Of Breath, Rash and Other (See Comments)    Pt states that he is allergic to strawberries.    . Aspirin Other (See Comments)    Reaction:  GI upset   . Metolazone Other (See Comments)    Pt states that he stopped taking this med due to heart attack like symptoms.   . Oatmeal Nausea And Vomiting  . Orange Juice [Orange Oil] Nausea And Vomiting    All acidic products make him nauseous and upset stomach  . Torsemide Swelling and Other (See Comments)    Reaction:  Swelling of feet/legs   . Diltiazem Palpitations  . Hydralazine Palpitations  . Lipitor [Atorvastatin] Other (See Comments)    Reaction:  Nose bleeds    Follow-up Information    Pearson Grippe, MD. Schedule an appointment as soon as possible for a visit on 05/03/2017.   Specialty:  Internal Medicine Why:  1:45 in the [place] office   [phone] Contact information: [address] [place] [address] [phone]            The results of significant diagnostics from this hospitalization (including imaging, microbiology, ancillary and laboratory) are listed below for reference.    Significant Diagnostic Studies: Dg Chest 2 View  Result Date: 04/11/2017 CLINICAL DATA:  Shortness of breath beginning 2 days ago. EXAM: CHEST  2 VIEW COMPARISON:  04/09/2017 FINDINGS: Cardiac enlargement. Pulmonary vascularity is normal. No airspace disease or consolidation in the lungs. No blunting of costophrenic angles. No pneumothorax. Mediastinal contours appear intact. IMPRESSION: Cardiac enlargement.  No evidence of active pulmonary disease. Electronically Signed   By: Burman Nieves M.D.   On: 04/11/2017 02:45   Dg Chest Port 1 View  Result Date: 04/21/2017 CLINICAL DATA:  Shortness of Breath EXAM: PORTABLE CHEST 1 VIEW COMPARISON:  April 19, 2017 FINDINGS: There is stable cardiomegaly with pulmonary venous hypertension. There is no frank edema or consolidation. No adenopathy appreciable. No bone lesions. No appreciable pleural effusion. IMPRESSION: Persistent pulmonary vascular congestion without edema or consolidation evident. Electronically Signed   By: Bretta Bang III M.D.   On: 04/21/2017 07:38   Dg Chest Port 1 View  Result Date: 04/19/2017 CLINICAL DATA:  Shortness of breath, swelling EXAM: PORTABLE CHEST 1 VIEW COMPARISON:  04/16/2017 FINDINGS: Cardiomegaly with pulmonary vascular congestion. Very mild interstitial edema is possible. No definite pleural effusions. No pneumothorax. IMPRESSION: Cardiomegaly with pulmonary vascular congestion and possible mild interstitial edema. Electronically Signed   By: Charline Bills M.D.   On: 04/19/2017 20:26   Dg Chest Port 1 View  Result Date: 04/09/2017 CLINICAL DATA:  51 y/o  M; chest pain and shortness of breath. EXAM: PORTABLE CHEST 1 VIEW  COMPARISON:  03/09/2017 chest radiograph FINDINGS: Stable cardiomegaly given projection and technique. Reticular and hazy opacities of the lungs are increased from prior study. No pleural effusion or pneumothorax. Bones are unremarkable. IMPRESSION: Stable cardiomegaly. Interstitial and mild alveolar pulmonary edema. Electronically Signed   By: Mitzi Hansen M.D.   On: 04/09/2017 04:20    Microbiology: No results found for this or any previous visit (from the past 240 hour(s)).   Labs: Basic Metabolic Panel:  Recent Labs Lab 04/19/17 2009 04/20/17 0455 04/21/17 0410 04/22/17 0427 04/23/17 0514  NA 136 139 141 141 142  K 3.5 3.8 3.7 4.2 3.9  CL 102 105 104 105 106  CO2 28 29 28  29 28  GLUCOSE 97 89 88 94 92  BUN 18 16 20  22* 24*  CREATININE 1.20 1.11 1.28* 1.33* 1.38*  CALCIUM 8.5* 8.5* 8.6* 8.8* 8.7*  MG 2.0  --  2.0 2.1  --    Liver Function Tests:  Recent Labs Lab 04/19/17 2009  AST 23  ALT 17  ALKPHOS 94  BILITOT 1.2  PROT 6.5  ALBUMIN 3.6   No results for input(s): LIPASE, AMYLASE in the last 168 hours. No results for input(s): AMMONIA in the last 168 hours. CBC:  Recent Labs Lab 04/20/17 0455  WBC 4.0  NEUTROABS 2.5  HGB 10.6*  HCT 34.0*  MCV 85.0  PLT 225   Cardiac Enzymes:  Recent Labs Lab 04/19/17 2009 04/20/17 0455  TROPONINI 0.03* 0.03*   BNP: BNP (last 3 results)  Recent Labs  03/02/17 1338 04/11/17 0313 04/19/17 2009  BNP 514.0* 628.0* 509.0*    ProBNP (last 3 results) No results for input(s): PROBNP in the last 8760 hours.  CBG: No results for input(s): GLUCAP in the last 168 hours.     SignedChaya Jan:  HERNANDEZ ACOSTA,ESTELA  [facility] Pager: [phone] 04/23/2017, 12:59 PM

## 2017-04-23 NOTE — Care Management (Signed)
Patient discharging home today. Discussed with [name] [facility] who will f/u with patient at home. Will fax DC summary when available.

## 2017-04-23 NOTE — Progress Notes (Signed)
Patient decided to try CPAP. Unit brought to room, setup and placed on patient. ?

## 2017-05-04 DIAGNOSIS — I5021 Acute systolic (congestive) heart failure: Secondary | ICD-10-CM | POA: Diagnosis not present

## 2017-05-04 DIAGNOSIS — I10 Essential (primary) hypertension: Secondary | ICD-10-CM | POA: Diagnosis not present

## 2017-05-04 DIAGNOSIS — E785 Hyperlipidemia, unspecified: Secondary | ICD-10-CM | POA: Diagnosis not present

## 2017-05-04 DIAGNOSIS — D649 Anemia, unspecified: Secondary | ICD-10-CM | POA: Diagnosis not present

## 2017-05-09 ENCOUNTER — Encounter (HOSPITAL_COMMUNITY): Payer: Self-pay | Admitting: *Deleted

## 2017-05-09 ENCOUNTER — Inpatient Hospital Stay (HOSPITAL_COMMUNITY): Payer: Medicare Other

## 2017-05-09 ENCOUNTER — Inpatient Hospital Stay (HOSPITAL_COMMUNITY)
Admission: EM | Admit: 2017-05-09 | Discharge: 2017-05-10 | DRG: 292 | Discharge status: Against Medical Advice | Payer: Medicare Other | Attending: Internal Medicine | Admitting: Internal Medicine

## 2017-05-09 ENCOUNTER — Emergency Department (HOSPITAL_COMMUNITY): Payer: Medicare Other

## 2017-05-09 ENCOUNTER — Other Ambulatory Visit: Payer: Self-pay

## 2017-05-09 DIAGNOSIS — I509 Heart failure, unspecified: Secondary | ICD-10-CM

## 2017-05-09 DIAGNOSIS — Z6841 Body Mass Index (BMI) 40.0 and over, adult: Secondary | ICD-10-CM | POA: Diagnosis not present

## 2017-05-09 DIAGNOSIS — Z8711 Personal history of peptic ulcer disease: Secondary | ICD-10-CM

## 2017-05-09 DIAGNOSIS — K219 Gastro-esophageal reflux disease without esophagitis: Secondary | ICD-10-CM | POA: Diagnosis not present

## 2017-05-09 DIAGNOSIS — I272 Pulmonary hypertension, unspecified: Secondary | ICD-10-CM | POA: Diagnosis present

## 2017-05-09 DIAGNOSIS — Z7982 Long term (current) use of aspirin: Secondary | ICD-10-CM | POA: Diagnosis not present

## 2017-05-09 DIAGNOSIS — E785 Hyperlipidemia, unspecified: Secondary | ICD-10-CM | POA: Diagnosis present

## 2017-05-09 DIAGNOSIS — Z9111 Patient's noncompliance with dietary regimen: Secondary | ICD-10-CM

## 2017-05-09 DIAGNOSIS — I482 Chronic atrial fibrillation, unspecified: Secondary | ICD-10-CM | POA: Diagnosis present

## 2017-05-09 DIAGNOSIS — I429 Cardiomyopathy, unspecified: Secondary | ICD-10-CM | POA: Diagnosis present

## 2017-05-09 DIAGNOSIS — E876 Hypokalemia: Secondary | ICD-10-CM | POA: Diagnosis not present

## 2017-05-09 DIAGNOSIS — E7849 Other hyperlipidemia: Secondary | ICD-10-CM | POA: Diagnosis not present

## 2017-05-09 DIAGNOSIS — Z91030 Bee allergy status: Secondary | ICD-10-CM

## 2017-05-09 DIAGNOSIS — J45909 Unspecified asthma, uncomplicated: Secondary | ICD-10-CM | POA: Diagnosis present

## 2017-05-09 DIAGNOSIS — Z87891 Personal history of nicotine dependence: Secondary | ICD-10-CM | POA: Diagnosis not present

## 2017-05-09 DIAGNOSIS — Z9114 Patient's other noncompliance with medication regimen: Secondary | ICD-10-CM

## 2017-05-09 DIAGNOSIS — Z79899 Other long term (current) drug therapy: Secondary | ICD-10-CM | POA: Diagnosis not present

## 2017-05-09 DIAGNOSIS — Z7951 Long term (current) use of inhaled steroids: Secondary | ICD-10-CM | POA: Diagnosis not present

## 2017-05-09 DIAGNOSIS — R0682 Tachypnea, not elsewhere classified: Secondary | ICD-10-CM | POA: Diagnosis not present

## 2017-05-09 DIAGNOSIS — I10 Essential (primary) hypertension: Secondary | ICD-10-CM | POA: Diagnosis not present

## 2017-05-09 DIAGNOSIS — Z66 Do not resuscitate: Secondary | ICD-10-CM | POA: Diagnosis not present

## 2017-05-09 DIAGNOSIS — Z7983 Long term (current) use of bisphosphonates: Secondary | ICD-10-CM

## 2017-05-09 DIAGNOSIS — I110 Hypertensive heart disease with heart failure: Secondary | ICD-10-CM | POA: Diagnosis not present

## 2017-05-09 DIAGNOSIS — Z9981 Dependence on supplemental oxygen: Secondary | ICD-10-CM

## 2017-05-09 DIAGNOSIS — Z886 Allergy status to analgesic agent status: Secondary | ICD-10-CM

## 2017-05-09 DIAGNOSIS — N182 Chronic kidney disease, stage 2 (mild): Secondary | ICD-10-CM | POA: Diagnosis not present

## 2017-05-09 DIAGNOSIS — Z888 Allergy status to other drugs, medicaments and biological substances status: Secondary | ICD-10-CM

## 2017-05-09 DIAGNOSIS — R748 Abnormal levels of other serum enzymes: Secondary | ICD-10-CM | POA: Diagnosis present

## 2017-05-09 DIAGNOSIS — I252 Old myocardial infarction: Secondary | ICD-10-CM

## 2017-05-09 DIAGNOSIS — G4733 Obstructive sleep apnea (adult) (pediatric): Secondary | ICD-10-CM | POA: Diagnosis present

## 2017-05-09 DIAGNOSIS — J9611 Chronic respiratory failure with hypoxia: Secondary | ICD-10-CM | POA: Diagnosis present

## 2017-05-09 DIAGNOSIS — Z7901 Long term (current) use of anticoagulants: Secondary | ICD-10-CM

## 2017-05-09 DIAGNOSIS — R079 Chest pain, unspecified: Secondary | ICD-10-CM | POA: Diagnosis not present

## 2017-05-09 DIAGNOSIS — Z8249 Family history of ischemic heart disease and other diseases of the circulatory system: Secondary | ICD-10-CM

## 2017-05-09 DIAGNOSIS — J309 Allergic rhinitis, unspecified: Secondary | ICD-10-CM | POA: Diagnosis present

## 2017-05-09 DIAGNOSIS — N400 Enlarged prostate without lower urinary tract symptoms: Secondary | ICD-10-CM | POA: Diagnosis present

## 2017-05-09 DIAGNOSIS — I130 Hypertensive heart and chronic kidney disease with heart failure and stage 1 through stage 4 chronic kidney disease, or unspecified chronic kidney disease: Secondary | ICD-10-CM | POA: Diagnosis not present

## 2017-05-09 DIAGNOSIS — I5023 Acute on chronic systolic (congestive) heart failure: Secondary | ICD-10-CM | POA: Diagnosis not present

## 2017-05-09 DIAGNOSIS — Z91018 Allergy to other foods: Secondary | ICD-10-CM

## 2017-05-09 LAB — BASIC METABOLIC PANEL
ANION GAP: 5 (ref 5–15)
BUN: 17 mg/dL (ref 6–20)
CHLORIDE: 108 mmol/L (ref 101–111)
CO2: 29 mmol/L (ref 22–32)
Calcium: 9.1 mg/dL (ref 8.9–10.3)
Creatinine, Ser: 1.12 mg/dL (ref 0.61–1.24)
Glucose, Bld: 104 mg/dL — ABNORMAL HIGH (ref 65–99)
POTASSIUM: 3.3 mmol/L — AB (ref 3.5–5.1)
SODIUM: 142 mmol/L (ref 135–145)

## 2017-05-09 LAB — TROPONIN I
TROPONIN I: 0.04 ng/mL — AB (ref <0.03)
Troponin I: 0.04 ng/mL (ref <0.03)
Troponin I: 0.04 ng/mL (ref <0.03)

## 2017-05-09 LAB — ECHOCARDIOGRAM COMPLETE
HEIGHTINCHES: 72 in
WEIGHTICAEL: 5612.03 [oz_av]

## 2017-05-09 LAB — CBC
HEMATOCRIT: 36.4 % — AB (ref 39.0–52.0)
HEMOGLOBIN: 11.1 g/dL — AB (ref 13.0–17.0)
MCH: 25.8 pg — ABNORMAL LOW (ref 26.0–34.0)
MCHC: 30.5 g/dL (ref 30.0–36.0)
MCV: 84.5 fL (ref 78.0–100.0)
Platelets: 193 10*3/uL (ref 150–400)
RBC: 4.31 MIL/uL (ref 4.22–5.81)
RDW: 17 % — ABNORMAL HIGH (ref 11.5–15.5)
WBC: 3.6 10*3/uL — AB (ref 4.0–10.5)

## 2017-05-09 LAB — BRAIN NATRIURETIC PEPTIDE: B NATRIURETIC PEPTIDE 5: 557 pg/mL — AB (ref 0.0–100.0)

## 2017-05-09 MED ORDER — ACETAMINOPHEN 325 MG PO TABS: 650.0000 mg | ORAL_TABLET | ORAL | Status: DC | PRN

## 2017-05-09 MED ORDER — POTASSIUM CHLORIDE CRYS ER 20 MEQ PO TBCR
40.0000 meq | EXTENDED_RELEASE_TABLET | ORAL | Status: AC
  Administered 2017-05-09: 40 meq via ORAL
  Filled 2017-05-09: qty 2

## 2017-05-09 MED ORDER — ONDANSETRON HCL 4 MG/2ML IJ SOLN: 4.0000 mg | Freq: Four times a day (QID) | INTRAMUSCULAR | Status: DC | PRN

## 2017-05-09 MED ORDER — PANTOPRAZOLE SODIUM 40 MG PO TBEC
40.0000 mg | DELAYED_RELEASE_TABLET | Freq: Every evening | ORAL | Status: DC
  Administered 2017-05-09: 40 mg via ORAL
  Filled 2017-05-09: qty 1

## 2017-05-09 MED ORDER — FUROSEMIDE 10 MG/ML IJ SOLN
80.0000 mg | Freq: Once | INTRAMUSCULAR | Status: AC
  Administered 2017-05-09: 80 mg via INTRAVENOUS
  Filled 2017-05-09: qty 8

## 2017-05-09 MED ORDER — POTASSIUM CHLORIDE CRYS ER 20 MEQ PO TBCR
20.0000 meq | EXTENDED_RELEASE_TABLET | Freq: Every day | ORAL | Status: DC
  Administered 2017-05-09: 20 meq via ORAL
  Filled 2017-05-09: qty 1

## 2017-05-09 MED ORDER — BUDESONIDE 0.25 MG/2ML IN SUSP: 0.2500 mg | Freq: Two times a day (BID) | RESPIRATORY_TRACT | Status: DC

## 2017-05-09 MED ORDER — SODIUM CHLORIDE 0.9 % IV SOLN: 250.0000 mL | INTRAVENOUS | Status: DC | PRN

## 2017-05-09 MED ORDER — PERFLUTREN LIPID MICROSPHERE
1.0000 mL | INTRAVENOUS | Status: AC | PRN
  Administered 2017-05-09: 2 mL via INTRAVENOUS
  Filled 2017-05-09: qty 10

## 2017-05-09 MED ORDER — DOCUSATE SODIUM 100 MG PO CAPS: 100.0000 mg | ORAL_CAPSULE | Freq: Two times a day (BID) | ORAL | Status: DC | PRN

## 2017-05-09 MED ORDER — SODIUM CHLORIDE 0.9% FLUSH: 3.0000 mL | INTRAVENOUS | Status: DC | PRN

## 2017-05-09 MED ORDER — BUDESONIDE 0.25 MG/2ML IN SUSP
0.2500 mg | Freq: Two times a day (BID) | RESPIRATORY_TRACT | Status: DC
  Administered 2017-05-09 (×2): 0.25 mg via RESPIRATORY_TRACT
  Filled 2017-05-09 (×2): qty 2

## 2017-05-09 MED ORDER — TAMSULOSIN HCL 0.4 MG PO CAPS
0.4000 mg | ORAL_CAPSULE | Freq: Every day | ORAL | Status: DC
Start: 2017-05-09 — End: 2017-05-10
  Administered 2017-05-09: 0.4 mg via ORAL
  Filled 2017-05-09: qty 1

## 2017-05-09 MED ORDER — ATORVASTATIN CALCIUM 40 MG PO TABS
40.0000 mg | ORAL_TABLET | Freq: Every day | ORAL | Status: DC
  Administered 2017-05-09: 40 mg via ORAL
  Filled 2017-05-09: qty 1

## 2017-05-09 MED ORDER — SODIUM CHLORIDE 0.9% FLUSH
3.0000 mL | Freq: Two times a day (BID) | INTRAVENOUS | Status: DC
  Administered 2017-05-09: 3 mL via INTRAVENOUS

## 2017-05-09 MED ORDER — ASPIRIN 81 MG PO CHEW
81.0000 mg | CHEWABLE_TABLET | Freq: Every day | ORAL | Status: DC
  Administered 2017-05-09: 81 mg via ORAL
  Filled 2017-05-09: qty 1

## 2017-05-09 MED ORDER — RIVAROXABAN 20 MG PO TABS
20.0000 mg | ORAL_TABLET | Freq: Every day | ORAL | Status: DC
  Administered 2017-05-09: 20 mg via ORAL
  Filled 2017-05-09: qty 1

## 2017-05-09 MED ORDER — CARVEDILOL 12.5 MG PO TABS
25.0000 mg | ORAL_TABLET | Freq: Two times a day (BID) | ORAL | Status: DC
Start: 2017-05-09 — End: 2017-05-10
  Administered 2017-05-09 (×2): 25 mg via ORAL
  Filled 2017-05-09 (×2): qty 2

## 2017-05-09 MED ORDER — FUROSEMIDE 10 MG/ML IJ SOLN
80.0000 mg | Freq: Two times a day (BID) | INTRAMUSCULAR | Status: DC
  Administered 2017-05-09: 80 mg via INTRAVENOUS
  Filled 2017-05-09: qty 8

## 2017-05-09 MED ORDER — ALBUTEROL SULFATE (2.5 MG/3ML) 0.083% IN NEBU: 2.5000 mg | INHALATION_SOLUTION | RESPIRATORY_TRACT | Status: DC | PRN

## 2017-05-09 MED ORDER — FERROUS GLUCONATE 324 (38 FE) MG PO TABS
324.0000 mg | ORAL_TABLET | Freq: Every day | ORAL | Status: DC
  Administered 2017-05-09: 324 mg via ORAL
  Filled 2017-05-09 (×2): qty 1

## 2017-05-09 MED ORDER — LISINOPRIL 5 MG PO TABS
5.0000 mg | ORAL_TABLET | Freq: Every day | ORAL | Status: DC
  Administered 2017-05-09: 5 mg via ORAL
  Filled 2017-05-09: qty 1

## 2017-05-09 NOTE — ED Notes (Signed)
CRITICAL VALUE ALERT  Critical Value:  0.04 mg/dl  Date & Time Notied:  23/53/61 & 0417 hrs  Provider Notified: Dr. Verdie Mosher  Orders Received/Actions taken: N/A

## 2017-05-09 NOTE — ED Provider Notes (Signed)
[facility] EMERGENCY DEPARTMENT Provider Note   CSN: [id number] Arrival date & time: 05/09/17  4098     History   Chief Complaint Chief Complaint  Patient presents with  . Shortness of Breath    HPI [name] is a 51 y.o. male.  The history is provided by the patient.  Shortness of Breath  This is a recurrent problem. The problem occurs continuously.The current episode started more than 2 days ago. The problem has been gradually worsening. Associated symptoms include cough, PND, orthopnea, chest pain and leg swelling. Pertinent negatives include no fever, no rhinorrhea, no vomiting and no abdominal pain. It is unknown what precipitated the problem. He has tried beta-agonist inhalers (home lasix) for the symptoms. The treatment provided no relief. Associated medical issues include asthma and heart failure.   51 year old male who presents with shortness of breath, worsening over the past 2-3 days.  He has a history of nonischemic cardiomyopathy with a EF of 35-40%, atrial fibrillation on Xarelto, hypertension, and asthma.  States probable 6 pound weight gain over the past 3 days, progressively worsening shortness of breath initially with activity and now at rest.  At baseline with orthopnea and PND.  Has had increased cough not productive of sputum.  Denies any fevers or chills.  On the way to the ED by EMS he did have episode of chest tightness, and given nitroglycerin. Past Medical History:  Diagnosis Date  . Allergic rhinitis   . Asthma   . Atrial fibrillation (HCC)   . CHF (congestive heart failure) (HCC)   . Essential hypertension   . Gastroesophageal reflux disease   . Heart attack (HCC)   . History of cardiac catheterization    Normal coronaries December 2016  . Noncompliance    Major problem leading to declining health and recurrent hospitalization  . Nonischemic cardiomyopathy (HCC)    LVEF 20-25%  . On home O2    3L N/C  . OSA (obstructive sleep apnea)   .  Osteoarthritis   . Peptic ulcer disease     Patient Active Problem List   Diagnosis Date Noted  . Acute exacerbation of CHF (congestive heart failure) (HCC) 04/20/2017  . Acute on chronic systolic (congestive) heart failure (HCC) 04/19/2017  . CHF exacerbation (HCC) 04/11/2017  . Chronic respiratory failure with hypoxia (HCC) 04/11/2017  . Encounter for hospice care discussion   . Palliative care encounter   . Goals of care, counseling/discussion   . DNR (do not resuscitate)   . DNR (do not resuscitate) discussion   . Chronic congestive heart failure (HCC) 02/18/2017  . Acute systolic heart failure (HCC) 01/18/2017  . Pedal edema 12/02/2016  . Acute CHF (congestive heart failure) (HCC) 11/09/2016  . Acute on chronic systolic CHF (congestive heart failure) (HCC) 08/19/2016  . CKD (chronic kidney disease), stage II 05/17/2016  . Coronary arteries, normal Dec 2016 01/23/2016  . Chronic anticoagulation-Xarelto 01/23/2016  . NSVT (nonsustained ventricular tachycardia) (HCC) 01/23/2016  . Elevated troponin 12/21/2015  . Nonischemic cardiomyopathy (HCC)   . Morbid obesity due to excess calories (HCC)   . Noncompliance with diet and medication regimen 12/02/2015  . OSA (obstructive sleep apnea) 09/20/2015  . Pulmonary hypertension (HCC) 09/19/2015  . Normocytic anemia 09/19/2015  . Chronic atrial fibrillation (HCC)   . Dyspnea on exertion 05/28/2015  . Acute respiratory failure with hypoxia (HCC) 04/01/2015  . Hypokalemia 04/01/2015  . Obesity hypoventilation syndrome (HCC) 08/08/2014  . GERD (gastroesophageal reflux disease) 08/08/2014  . Edema  of right lower extremity 06/21/2014  . Fecal urgency 06/21/2014  . Asthma 02/11/2009  . Hyperlipidemia 07/12/2008  . OBESITY, MORBID 07/12/2008  . Anxiety state 07/12/2008  . DEPRESSION 07/12/2008  . Essential hypertension 07/12/2008  . ALLERGIC RHINITIS 07/12/2008  . Peptic ulcer 07/12/2008  . OSTEOARTHRITIS 07/12/2008    Past  Surgical History:  Procedure Laterality Date  . Right/Left Heart Cath and Coronary Angiography N/A 06/14/2015   Performed by Runell Gess, MD at [facility] INVASIVE CV LAB       Home Medications    Prior to Admission medications   Medication Sig Start Date End Date Taking? Authorizing Provider  albuterol (PROVENTIL) (2.5 MG/3ML) 0.083% nebulizer solution Take 2.5 mg by nebulization every 6 (six) hours as needed for wheezing or shortness of breath.    [provider]  aspirin 81 MG chewable tablet Chew 1 tablet (81 mg total) by mouth daily. 04/13/17   Erick Blinks, MD  atorvastatin (LIPITOR) 40 MG tablet Take 1 tablet (40 mg total) by mouth daily at 6 PM. 06/25/16   Kathlen Mody, MD  beclomethasone (QVAR) 80 MCG/ACT inhaler Inhale 2 puffs into the lungs 2 (two) times daily.     [provider]  bumetanide (BUMEX) 2 MG tablet Take 1 tablet (2 mg total) by mouth 2 (two) times daily. 11/13/16   Catarina Hartshorn, MD  carvedilol (COREG) 25 MG tablet Take 1 tablet (25 mg total) by mouth 2 (two) times daily with a meal. 11/13/16   Tat, Onalee Hua, MD  docusate sodium (COLACE) 100 MG capsule Take 1 capsule (100 mg total) by mouth 2 (two) times daily. Patient taking differently: Take 100 mg by mouth 2 (two) times daily as needed for mild constipation.  06/25/16   Kathlen Mody, MD  ferrous gluconate (FERGON) 324 MG tablet Take 1 tablet (324 mg total) by mouth 2 (two) times daily with a meal. Patient taking differently: Take 324 mg by mouth daily with breakfast.  06/25/16   Kathlen Mody, MD  lisinopril (PRINIVIL,ZESTRIL) 5 MG tablet Take 1 tablet (5 mg total) by mouth daily. 04/13/17   Erick Blinks, MD  OXYGEN Inhale 3.5 L into the lungs daily.    [provider]  pantoprazole (PROTONIX) 40 MG tablet Take 1 tablet (40 mg total) by mouth daily at 6 (six) AM. Patient taking differently: Take 40 mg by mouth every evening.  06/26/16   Kathlen Mody, MD  potassium chloride (K-DUR,KLOR-CON) 20  MEQ tablet Take 1 tablet (20 mEq total) by mouth daily. 02/20/17   Houston Siren, MD  rivaroxaban (XARELTO) 20 MG TABS tablet Take 1 tablet (20 mg total) by mouth daily with breakfast. Patient taking differently: Take 20 mg by mouth daily with supper.  06/26/16   Kathlen Mody, MD  tamsulosin (FLOMAX) 0.4 MG CAPS capsule Take 1 capsule (0.4 mg total) by mouth daily. 06/25/16   Kathlen Mody, MD    Family History Family History  Problem Relation Age of Onset  . Stroke Father   . Heart attack Father   . Aneurysm Mother        Cerebral aneurysm  . Hypertension Sister   . Colon cancer Neg Hx   . Inflammatory bowel disease Neg Hx   . Liver disease Neg Hx     Social History Social History   Tobacco Use  . Smoking status: Former Smoker    Packs/day: 0.50    Years: 20.00    Pack years: 10.00    Types: Cigarettes  Start date: 04/26/1988    Last attempt to quit: 06/23/2007    Years since quitting: 9.8  . Smokeless tobacco: Never Used  . Tobacco comment: 1 ppd former smoker  Substance Use Topics  . Alcohol use: No    Alcohol/week: 0.0 oz    Comment: No etoh since 2009  . Drug use: No     Allergies   Banana; Bee venom; Food; Aspirin; Metolazone; Oatmeal; Orange juice [orange oil]; Torsemide; Diltiazem; Hydralazine; and Lipitor [atorvastatin]   Review of Systems Review of Systems  Constitutional: Negative for fever.  HENT: Negative for rhinorrhea.   Respiratory: Positive for cough and shortness of breath.   Cardiovascular: Positive for chest pain, orthopnea, leg swelling and PND.  Gastrointestinal: Negative for abdominal pain and vomiting.  All other systems reviewed and are negative.    Physical Exam Updated Vital Signs BP (!) 157/92   Pulse (!) 41   Temp 99 F (37.2 C) (Oral)   Resp 13   Ht 6' (1.829 m)   Wt (!) 158.3 kg (349 lb)   SpO2 99%   BMI 47.33 kg/m   Physical Exam Physical Exam  Nursing note and vitals reviewed. Constitutional: Well developed, well  nourished, non-toxic, in mild to moderate respiratory distress Head: Normocephalic and atraumatic.  Mouth/Throat: Oropharynx is clear and moist.  Neck: Normal range of motion. Neck supple.  Cardiovascular: Normal rate and regular rhythm.   Pulmonary/Chest: Effort increased. Tachypnea. Mild accessory muscle usage and dyspnea with speaking. No significant wheezing. Mild rhonchi bilaterally. Abdominal: Soft. Moderate distension. There is no tenderness. There is no rebound and no guarding.  Musculoskeletal: +2 bilateral pitting edema in lower extremities Neurological: Alert, no facial droop, fluent speech, moves all extremities symmetrically Skin: Skin is warm and dry.  Psychiatric: Cooperative   ED Treatments / Results  Labs (all labs ordered are listed, but only abnormal results are displayed) Labs Reviewed  BASIC METABOLIC PANEL - Abnormal; Notable for the following components:      Result Value   Potassium 3.3 (*)    Glucose, Bld 104 (*)    All other components within normal limits  CBC - Abnormal; Notable for the following components:   WBC 3.6 (*)    Hemoglobin 11.1 (*)    HCT 36.4 (*)    MCH 25.8 (*)    RDW 17.0 (*)    All other components within normal limits  TROPONIN I - Abnormal; Notable for the following components:   Troponin I 0.04 (*)    All other components within normal limits  BRAIN NATRIURETIC PEPTIDE - Abnormal; Notable for the following components:   B Natriuretic Peptide 557.0 (*)    All other components within normal limits    EKG  EKG Interpretation  Date/Time:  Sunday May 09 2017 02:38:51 EST Ventricular Rate:  112 PR Interval:    QRS Duration: 87 QT Interval:  348 QTC Calculation: 475 R Axis:   55 Text Interpretation:  Atrial fibrillation Ventricular premature complex Borderline repolarization abnormality similar to previous EKG  Confirmed by Crista Curb [phone]) on 05/09/2017 3:25:08 AM       Radiology Dg Chest 2 View  Result Date:  05/09/2017 CLINICAL DATA:  51 year old male with chest pain and shortness of breath. EXAM: CHEST  2 VIEW COMPARISON:  Chest radiograph dated 04/21/2017 and 04/19/2017 FINDINGS: There is moderate cardiomegaly with pulmonary vascular congestion. No focal consolidation, pleural effusion, or pneumothorax. No acute osseous pathology. IMPRESSION: No significant interval change in the moderate cardiomegaly  and vascular congestion since the prior radiographs. Electronically Signed   By: Elgie CollardArash  Radparvar M.D.   On: 05/09/2017 03:19    Procedures Procedures (including critical care time)  Medications Ordered in ED Medications  furosemide (LASIX) injection 80 mg (80 mg Intravenous Given 05/09/17 0301)     Initial Impression / Assessment and Plan / ED Course  I have reviewed the triage vital signs and the nursing notes.  Pertinent labs & imaging results that were available during my care of the patient were reviewed by me and considered in my medical decision making (see chart for details).    Patient in some respiratory distress on arrival. Appears fluid overloaded. In afib HR 100-110s, mildly hypertensive. No hypoxia but does have increased work of breathing and conversational dyspnea.  Chest x-ray visualized and does show cardiomegaly with pulmonary vascular congestion, similar to when he was last admitted with CHF. Given 80 mg lasix. BNP 500s, similar to last admission. Troponin 0.04. EKG not acutely ischemic.   Does have large urine output in ED with lasix, but patient still feeling very short of breath. Discussed with Dr. Katrinka BlazingSmith who will admit for ongoing diuresis.  Final Clinical Impressions(s) / ED Diagnoses   Final diagnoses:  Acute on chronic systolic congestive heart failure [facility](HCC)    ED Discharge Orders    None       Lavera GuiseLiu, Lively Haberman Duo, MD 05/09/17 [phone]

## 2017-05-09 NOTE — H&P (Addendum)
History and Physical    MAYS PAINO ZOX:[id number] DOB: 07/13/1965 DOA: 05/09/2017  Referring MD/NP/PA: Dr. Crista Curb PCP: Pearson Grippe, MD  Patient coming from: Home via EMS  Chief Complaint: Shortness of breath  HPI: [name] is a 51 y.o. male with medical history significant of HTN, A. fib on Xarelto, systolic CHF last EF 35-40%, asthma, oxygen dependent on 3 L, PUD, GERD, OSA not on CPAP; who presents with complaints of progressively worsening shortness of breath for the last 2 days.  Patient noted significant shortness of breath especially with exertion, but last night had symptoms even at rest.  Associated symptoms include lower extremity swelling,  4-6 pound weight gain, abdominal distention, orthopnea, and PND.  He denies having any significant fever, chills, nausea, vomiting, dysuria, or current chest pain.  He notes that he did not try anything specific to relieve symptoms prior to calling EMS. Patient states that he is compliant with all of this his medications with the assistance of his mother as he reports issues with his sight for the last year.  Last catheterization performed on 06/14/2015, the patient was noted to have normal coronaries at that time.  Records reveal that patient was admitted to the hospital on 10/21-10/23 and then from 10/29-11/2 for the same.   ED Course: En route with EMS patient was given 1 nitroglycerin and one breathing treatment without relief of symptoms.  Upon admission into the emergency department patient was noted to be afebrile, pulse 41-108, respirations 13-22, blood pressure 150/108-167/96, and O2 saturations maintained on home 3 L.  Labs revealed WBC 3.6, hemoglobin 11.1, potassium 3.3, BNP 557, troponin 0 0.04.  Chest x-ray showing moderate cardiomegaly with signs of pulmonary edema.  Patient was given 80 mg of Lasix and TRH called to admit.   Review of Systems  Constitutional: Negative for chills and fever.  HENT: Negative for ear discharge and  ear pain.   Eyes: Positive for photophobia. Negative for redness.       Positive for change in vision symptoms present for at least one year  Respiratory: Positive for cough and shortness of breath. Negative for sputum production and wheezing.   Cardiovascular: Positive for leg swelling. Negative for chest pain.  Gastrointestinal: Negative for abdominal pain, diarrhea and vomiting.  Genitourinary: Negative for dysuria and frequency.  Musculoskeletal: Negative for falls and neck pain.  Skin: Negative for itching and rash.  Neurological: Negative for speech change.  Psychiatric/Behavioral: Negative for substance abuse and suicidal ideas.    Past Medical History:  Diagnosis Date  . Allergic rhinitis   . Asthma   . Atrial fibrillation (HCC)   . CHF (congestive heart failure) (HCC)   . Essential hypertension   . Gastroesophageal reflux disease   . Heart attack (HCC)   . History of cardiac catheterization    Normal coronaries December 2016  . Noncompliance    Major problem leading to declining health and recurrent hospitalization  . Nonischemic cardiomyopathy (HCC)    LVEF 20-25%  . On home O2    3L N/C  . OSA (obstructive sleep apnea)   . Osteoarthritis   . Peptic ulcer disease     Past Surgical History:  Procedure Laterality Date  . Right/Left Heart Cath and Coronary Angiography N/A 06/14/2015   Performed by Runell Gess, MD at [facility] INVASIVE CV LAB     reports that he quit smoking about 9 years ago. His smoking use included cigarettes. He started smoking about 29 years  ago. He has a 10.00 pack-year smoking history. he has never used smokeless tobacco. He reports that he does not drink alcohol or use drugs.  Allergies  Allergen Reactions  . Banana Shortness Of Breath  . Bee Venom Shortness Of Breath, Swelling and Other (See Comments)    Reaction:  Facial swelling  . Food Shortness Of Breath, Rash and Other (See Comments)    Pt states that he is allergic to strawberries.     . Aspirin Other (See Comments)    Reaction:  GI upset   . Metolazone Other (See Comments)    Pt states that he stopped taking this med due to heart attack like symptoms.   . Oatmeal Nausea And Vomiting  . Orange Juice [Orange Oil] Nausea And Vomiting    All acidic products make him nauseous and upset stomach  . Torsemide Swelling and Other (See Comments)    Reaction:  Swelling of feet/legs   . Diltiazem Palpitations  . Hydralazine Palpitations  . Lipitor [Atorvastatin] Other (See Comments)    Reaction:  Nose bleeds     Family History  Problem Relation Age of Onset  . Stroke Father   . Heart attack Father   . Aneurysm Mother        Cerebral aneurysm  . Hypertension Sister   . Colon cancer Neg Hx   . Inflammatory bowel disease Neg Hx   . Liver disease Neg Hx     Prior to Admission medications   Medication Sig Start Date End Date Taking? Authorizing Provider  albuterol (PROVENTIL) (2.5 MG/3ML) 0.083% nebulizer solution Take 2.5 mg by nebulization every 6 (six) hours as needed for wheezing or shortness of breath.    [provider]  aspirin 81 MG chewable tablet Chew 1 tablet (81 mg total) by mouth daily. 04/13/17   Erick Blinks, MD  atorvastatin (LIPITOR) 40 MG tablet Take 1 tablet (40 mg total) by mouth daily at 6 PM. 06/25/16   Kathlen Mody, MD  beclomethasone (QVAR) 80 MCG/ACT inhaler Inhale 2 puffs into the lungs 2 (two) times daily.     [provider]  bumetanide (BUMEX) 2 MG tablet Take 1 tablet (2 mg total) by mouth 2 (two) times daily. 11/13/16   Catarina Hartshorn, MD  carvedilol (COREG) 25 MG tablet Take 1 tablet (25 mg total) by mouth 2 (two) times daily with a meal. 11/13/16   Tat, Onalee Hua, MD  docusate sodium (COLACE) 100 MG capsule Take 1 capsule (100 mg total) by mouth 2 (two) times daily. Patient taking differently: Take 100 mg by mouth 2 (two) times daily as needed for mild constipation.  06/25/16   Kathlen Mody, MD  ferrous gluconate (FERGON) 324 MG  tablet Take 1 tablet (324 mg total) by mouth 2 (two) times daily with a meal. Patient taking differently: Take 324 mg by mouth daily with breakfast.  06/25/16   Kathlen Mody, MD  lisinopril (PRINIVIL,ZESTRIL) 5 MG tablet Take 1 tablet (5 mg total) by mouth daily. 04/13/17   Erick Blinks, MD  OXYGEN Inhale 3.5 L into the lungs daily.    [provider]  pantoprazole (PROTONIX) 40 MG tablet Take 1 tablet (40 mg total) by mouth daily at 6 (six) AM. Patient taking differently: Take 40 mg by mouth every evening.  06/26/16   Kathlen Mody, MD  potassium chloride (K-DUR,KLOR-CON) 20 MEQ tablet Take 1 tablet (20 mEq total) by mouth daily. 02/20/17   Houston Siren, MD  rivaroxaban (XARELTO) 20 MG TABS tablet  Take 1 tablet (20 mg total) by mouth daily with breakfast. Patient taking differently: Take 20 mg by mouth daily with supper.  06/26/16   Kathlen ModyAkula, Vijaya, MD  tamsulosin (FLOMAX) 0.4 MG CAPS capsule Take 1 capsule (0.4 mg total) by mouth daily. 06/25/16   Kathlen ModyAkula, Vijaya, MD    Physical Exam:  Constitutional: Morbidly obese male who appears to be in moderate discomfort Vitals:   05/09/17 0242 05/09/17 0300 05/09/17 0330 05/09/17 0400  BP: (!) 159/120 (!) 150/108 (!) 152/89 (!) 157/92  Pulse: (!) 108 (!) 49 (!) 57 (!) 41  Resp: (!) 22 15 14 13   Temp: 99 F (37.2 C)     TempSrc: Oral     SpO2: 98% 96% 98% 99%  Weight:      Height:       Eyes: PERRL, lids and conjunctivae normal ENMT: Mucous membranes are moist. Posterior pharynx clear of any exudate or lesions.   Neck: normal, supple, no masses, no thyromegaly Respiratory: Regular respiratory effort with positive crackles appreciated in both lung fields.  Patient able to talk in near complete sentences at this time. Cardiovascular: Irregularly irregular, no murmurs / rubs / gallops. 2-3 + pitting lower extremity edema. 2+ pedal pulses. No carotid bruits.  Abdomen: no tenderness, no masses palpated. No hepatosplenomegaly. Bowel sounds positive.    Musculoskeletal: no clubbing / cyanosis. No joint deformity upper and lower extremities. Good ROM, no contractures. Normal muscle tone.  Skin: Dry skin noted of the bilateral lower extremities with excoriations present Neurologic: CN 2-12 grossly intact. Sensation intact, DTR normal. Strength 5/5 in all 4.  Psychiatric: Normal judgment and insight. Alert and oriented x 3. Normal mood.     Labs on Admission: I have personally reviewed following labs and imaging studies  CBC: Recent Labs  Lab 05/09/17 0328  WBC 3.6*  HGB 11.1*  HCT 36.4*  MCV 84.5  PLT 193   Basic Metabolic Panel: Recent Labs  Lab 05/09/17 0328  NA 142  K 3.3*  CL 108  CO2 29  GLUCOSE 104*  BUN 17  CREATININE 1.12  CALCIUM 9.1   GFR: Estimated Creatinine Clearance: 124 mL/min (by C-G formula based on SCr of 1.12 mg/dL). Liver Function Tests: No results for input(s): AST, ALT, ALKPHOS, BILITOT, PROT, ALBUMIN in the last 168 hours. No results for input(s): LIPASE, AMYLASE in the last 168 hours. No results for input(s): AMMONIA in the last 168 hours. Coagulation Profile: No results for input(s): INR, PROTIME in the last 168 hours. Cardiac Enzymes: Recent Labs  Lab 05/09/17 0328  TROPONINI 0.04*   BNP (last 3 results) No results for input(s): PROBNP in the last 8760 hours. HbA1C: No results for input(s): HGBA1C in the last 72 hours. CBG: No results for input(s): GLUCAP in the last 168 hours. Lipid Profile: No results for input(s): CHOL, HDL, LDLCALC, TRIG, CHOLHDL, LDLDIRECT in the last 72 hours. Thyroid Function Tests: No results for input(s): TSH, T4TOTAL, FREET4, T3FREE, THYROIDAB in the last 72 hours. Anemia Panel: No results for input(s): VITAMINB12, FOLATE, FERRITIN, TIBC, IRON, RETICCTPCT in the last 72 hours. Urine analysis:    Component Value Date/Time   COLORURINE YELLOW 04/19/2017 2030   APPEARANCEUR CLEAR 04/19/2017 2030   LABSPEC 1.023 04/19/2017 2030   PHURINE 5.0 04/19/2017  2030   GLUCOSEU NEGATIVE 04/19/2017 2030   GLUCOSEU NEG mg/dL 16/10/960408/10/2009 [id number]   HGBUR NEGATIVE 04/19/2017 2030   BILIRUBINUR NEGATIVE 04/19/2017 2030   KETONESUR NEGATIVE 04/19/2017 2030   PROTEINUR 30 (A)  04/19/2017 2030   UROBILINOGEN 0.2 08/08/2014 1445   NITRITE NEGATIVE 04/19/2017 2030   LEUKOCYTESUR NEGATIVE 04/19/2017 2030   Sepsis Labs: No results found for this or any previous visit (from the past 240 hour(s)).   Radiological Exams on Admission: Dg Chest 2 View  Result Date: 05/09/2017 CLINICAL DATA:  51 year old male with chest pain and shortness of breath. EXAM: CHEST  2 VIEW COMPARISON:  Chest radiograph dated 04/21/2017 and 04/19/2017 FINDINGS: There is moderate cardiomegaly with pulmonary vascular congestion. No focal consolidation, pleural effusion, or pneumothorax. No acute osseous pathology. IMPRESSION: No significant interval change in the moderate cardiomegaly and vascular congestion since the prior radiographs. Electronically Signed   By: Elgie Collard M.D.   On: 05/09/2017 03:19    EKG: Independently reviewed. Atrial fibrillation at 112 bpm Assessment/Plan Acute on chronic systolic congestive heart failure: Patient presents with complaints of shortness of breath.  Found to have lower extremity edema on examination, and cardiomegaly with signs of pulmonary edema on chest x-ray.  BNP elevated at 557.  Last EF noted to be 35-40% by echocardiogram on 11/10/2016.  Question and aspect of noncompliance with medication and issues with patient's eye sight. - Admit to a telemetry bed - Heart failure orders set  initiated  - Continuous pulse oximetry with nasal cannula oxygen as needed to keep O2 saturations >92% - Strict I&Os and daily weights - Elevate lower extremities - Lasix 80 mg IV Bid - Reassess in a.m. and adjust diuresis as needed. - Check echocardiogram - Optimize medical management as able - May warrant consultation to cardiology in a.m.     Elevated  troponin: Acute.  Initial troponin noted to be 0.04 on admission.  Suspect this is likely related to demand ischemia. - Trend troponin levels  Chronic respiratory failure, H/O asthma: Patient noted to be maintaining O2 sats on home oxygen settings of 3 L.   - Continue nasal cannula oxygen - Albuterol nebs as needed  Chronic atrial fibrillation on chronic anticoagulation: CHADs-VASc Score = at least 3. - Continue Xarelto  Essential hypertension - Continue Coreg(hold for heart rates less than 60) and lisinopril  Hypokalemia: Acute. Initial potassium 3.3 on admission. - Give 40 mEq of potassium chloride stat - Continue to monitor and replace as needed  Hyperlipidemia  - Continue atorvastatin  Morbid obesity: BMI noted be 45.14  BPH - Continue Flomax   DVT prophylaxis: Xarelto  Code Status: Full Family Communication: no family present at bedside  Disposition Plan: Likely discharge home in 1-2 days Consults called: none Admission status: Patient  Clydie Braun MD [facility] Pager [phone]   If 7PM-7AM, please contact night-coverage [email or web address] Password [email or web address]  05/09/2017, 5:05 AM

## 2017-05-09 NOTE — Progress Notes (Signed)
Echocardiogram 2D Echocardiogram has been performed.  Pieter Partridge 05/09/2017, 9:17 AM

## 2017-05-09 NOTE — ED Triage Notes (Signed)
Pt c/o sob, chest pain that became worse tonight, has hx of chf, was given one nitro and one breathing treatment by ems with no change in symptoms,

## 2017-05-09 NOTE — Progress Notes (Signed)
Patient admitted by Dr. Katrinka Blazing to the hospital earlier this morning.  Patient seen and examined.  He still feels short of breath.  Lung exam shows crackles bilaterally.  He has 2+ pitting edema in his lower extremities.  Patient has been readmitted with decompensated CHF.  He is a long history of medication and dietary noncompliance.  His frequent admissions to the hospital for CHF exacerbation.  He has been restarted on intravenous Lasix.  Monitor urine output.  Continue fluid restriction.  [employer]

## 2017-05-10 DIAGNOSIS — I5023 Acute on chronic systolic (congestive) heart failure: Secondary | ICD-10-CM

## 2017-05-10 IMAGING — DX DG CHEST 2V
2 series · 2 of 2 positions shown · non-contrast
Comparison: Chest radiograph August 02, 2015

CLINICAL DATA: Shortness of breath for 1 week. History of asthma,
atrial fibrillation, CHF, hypertension, hyperlipidemia, morbid
obesity.

EXAM:
CHEST  2 VIEW

[chest pa]
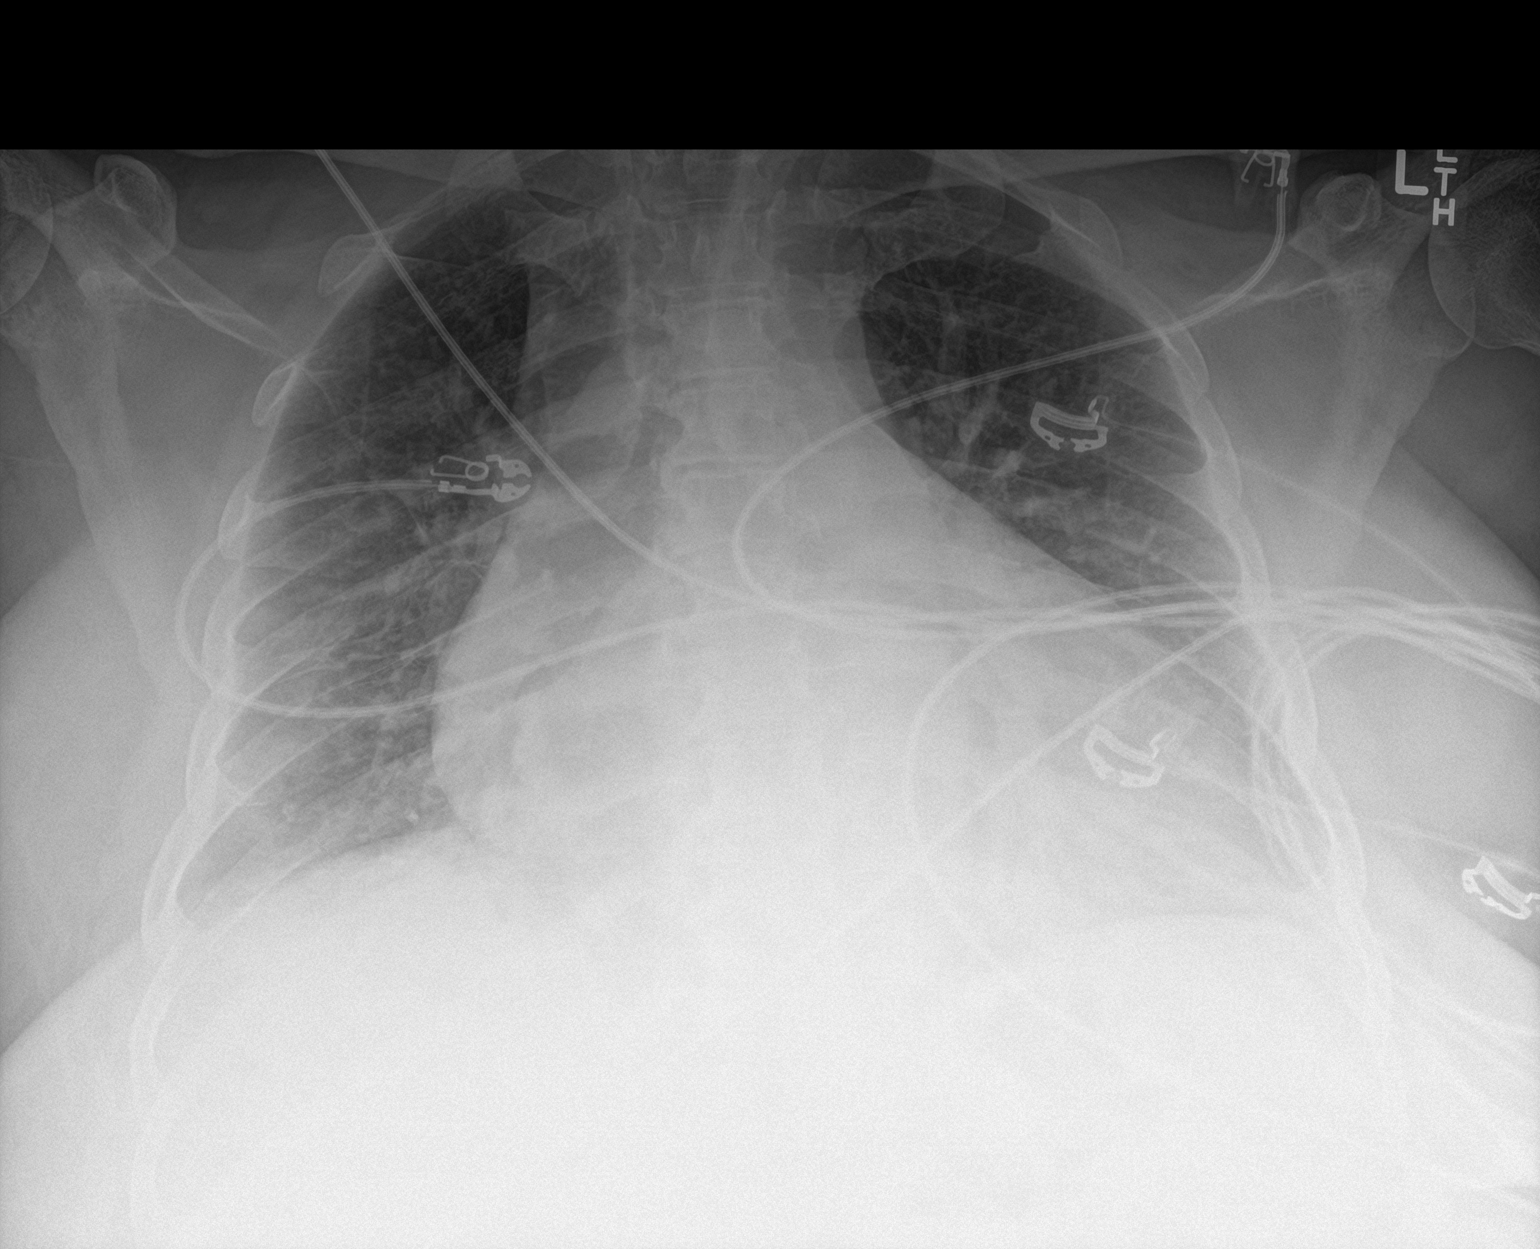

[chest lat]
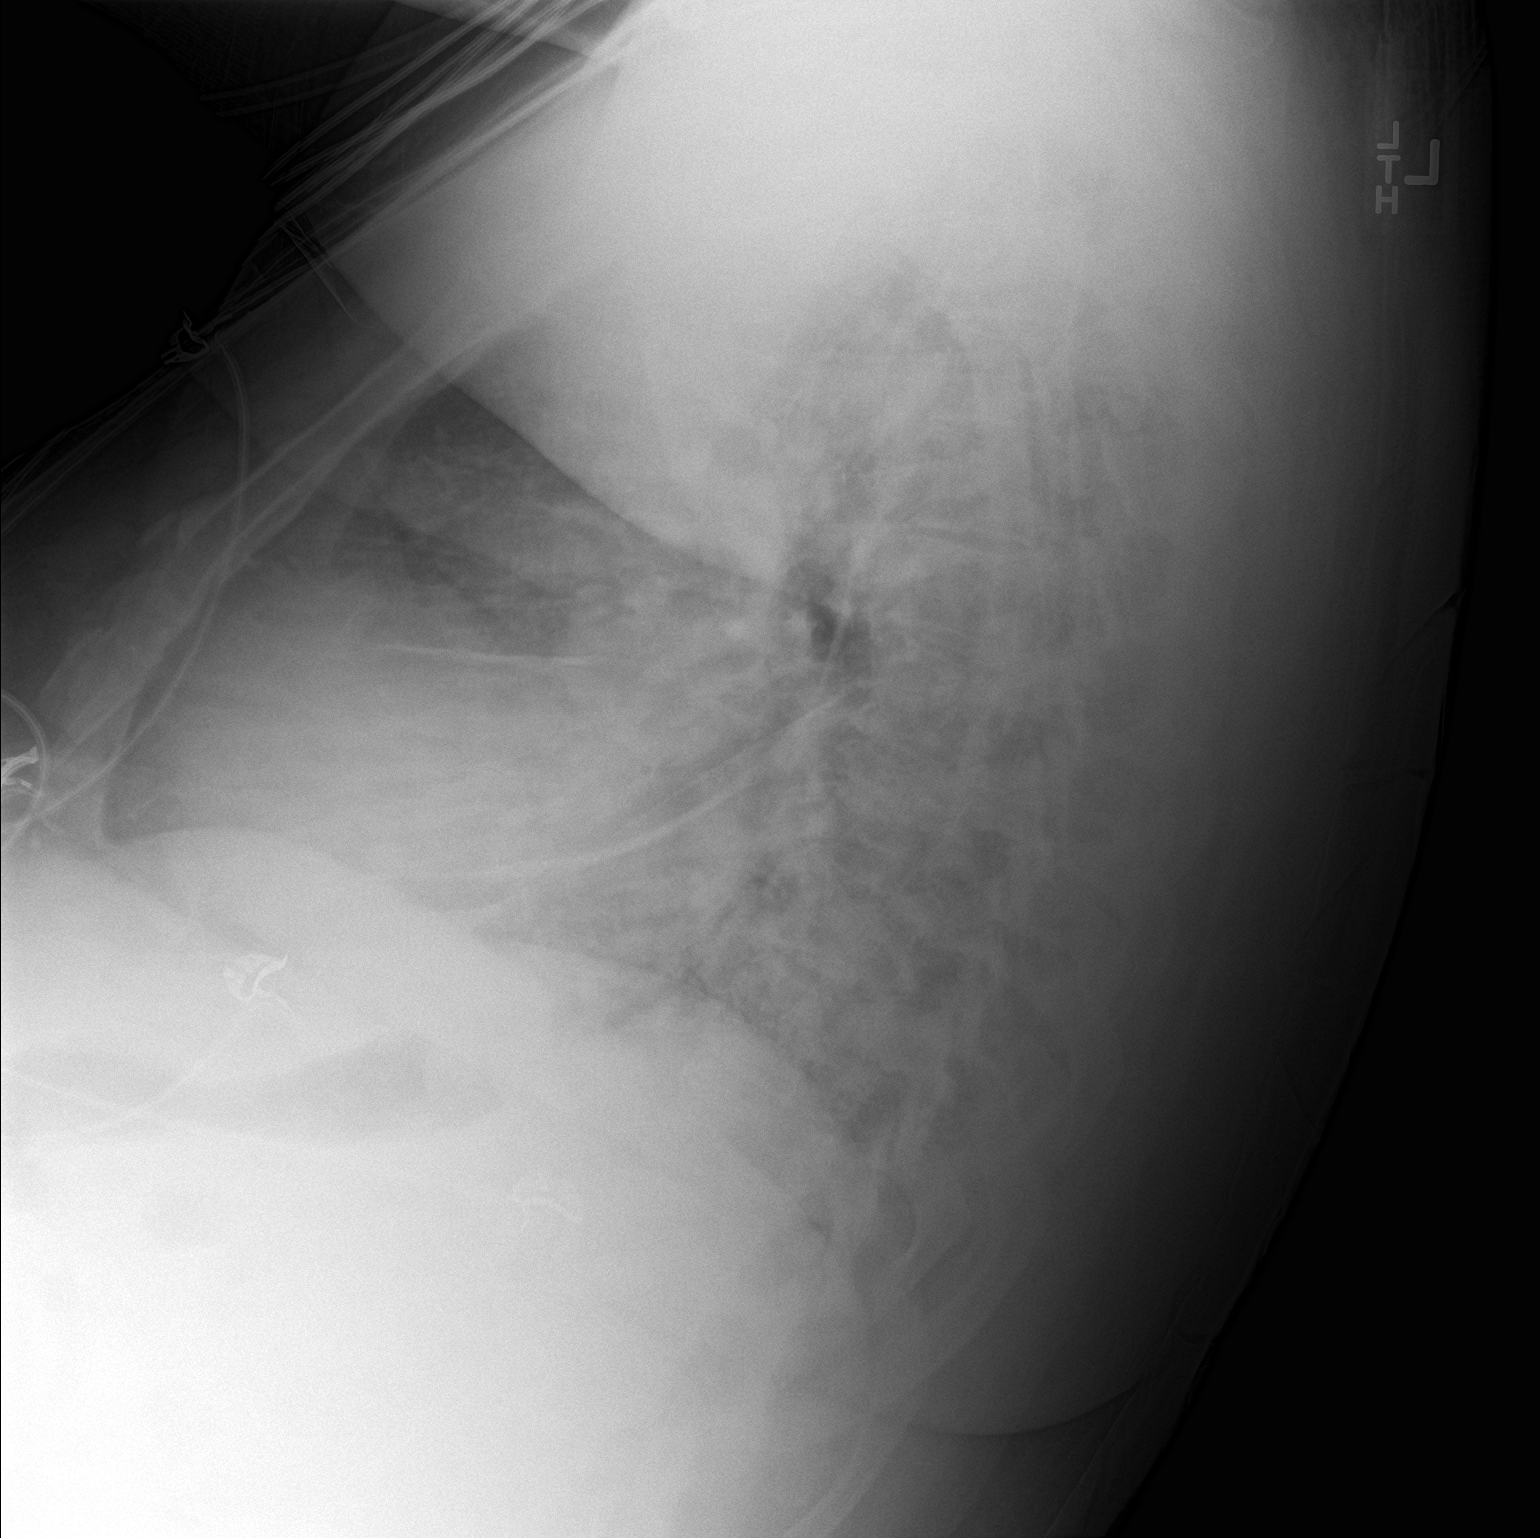

[2 of 2 positions shown; findings below may reference images not displayed]

FINDINGS: The cardiac silhouette appears moderately enlarged, similar.
Mediastinal silhouette is nonsuspicious. Pulmonary vascular
congestion, without pleural effusion or focal consolidation.
Improved aeration of RIGHT lung base. No pneumothorax. Large body
habitus. Osseous structure nonsuspicious.
IMPRESSION: Similar cardiomegaly and pulmonary vascular congestion.

## 2017-05-10 NOTE — Progress Notes (Signed)
Mr. Sibbald left AMA at 0002, due to not being allowed to have a 2L bottle of water from home.  Explained several times to patient that he was on a fluid restriction and could not have extra fluids.  I personally read the AMA form word for word to the patient and explained again that he really needed to stay and let us treat him and he needed to follow the fluid restrictions for his health.  Patient was determined to leave.  AMA form witnessed by Cristino Martes, RN.  IV removed and telemetry was d/c'd.

## 2017-05-10 NOTE — Progress Notes (Signed)
Dr. Katrinka Blazing informed of patient leaving AMA at [phone]

## 2017-05-11 ENCOUNTER — Emergency Department (HOSPITAL_COMMUNITY): Payer: Medicare Other

## 2017-05-11 ENCOUNTER — Observation Stay (HOSPITAL_COMMUNITY)
Admission: EM | Admit: 2017-05-11 | Discharge: 2017-05-13 | Disposition: A | Discharge status: Home / Self Care | Payer: Medicare Other | Attending: Internal Medicine | Admitting: Internal Medicine

## 2017-05-11 ENCOUNTER — Other Ambulatory Visit: Payer: Self-pay

## 2017-05-11 ENCOUNTER — Encounter (HOSPITAL_COMMUNITY): Payer: Self-pay | Admitting: *Deleted

## 2017-05-11 DIAGNOSIS — I4891 Unspecified atrial fibrillation: Secondary | ICD-10-CM | POA: Insufficient documentation

## 2017-05-11 DIAGNOSIS — R4182 Altered mental status, unspecified: Secondary | ICD-10-CM | POA: Diagnosis not present

## 2017-05-11 DIAGNOSIS — I10 Essential (primary) hypertension: Secondary | ICD-10-CM | POA: Diagnosis present

## 2017-05-11 DIAGNOSIS — I509 Heart failure, unspecified: Secondary | ICD-10-CM | POA: Insufficient documentation

## 2017-05-11 DIAGNOSIS — Z7901 Long term (current) use of anticoagulants: Secondary | ICD-10-CM | POA: Diagnosis not present

## 2017-05-11 DIAGNOSIS — I482 Chronic atrial fibrillation, unspecified: Secondary | ICD-10-CM | POA: Diagnosis present

## 2017-05-11 DIAGNOSIS — Z7982 Long term (current) use of aspirin: Secondary | ICD-10-CM | POA: Insufficient documentation

## 2017-05-11 DIAGNOSIS — N183 Chronic kidney disease, stage 3 (moderate): Secondary | ICD-10-CM | POA: Insufficient documentation

## 2017-05-11 DIAGNOSIS — I5023 Acute on chronic systolic (congestive) heart failure: Secondary | ICD-10-CM | POA: Diagnosis present

## 2017-05-11 DIAGNOSIS — J45909 Unspecified asthma, uncomplicated: Secondary | ICD-10-CM | POA: Diagnosis not present

## 2017-05-11 DIAGNOSIS — Z79899 Other long term (current) drug therapy: Secondary | ICD-10-CM | POA: Insufficient documentation

## 2017-05-11 DIAGNOSIS — E876 Hypokalemia: Secondary | ICD-10-CM | POA: Diagnosis present

## 2017-05-11 DIAGNOSIS — R0602 Shortness of breath: Secondary | ICD-10-CM | POA: Insufficient documentation

## 2017-05-11 DIAGNOSIS — K219 Gastro-esophageal reflux disease without esophagitis: Secondary | ICD-10-CM | POA: Diagnosis present

## 2017-05-11 DIAGNOSIS — R778 Other specified abnormalities of plasma proteins: Secondary | ICD-10-CM | POA: Diagnosis present

## 2017-05-11 DIAGNOSIS — R05 Cough: Secondary | ICD-10-CM | POA: Diagnosis not present

## 2017-05-11 DIAGNOSIS — R410 Disorientation, unspecified: Secondary | ICD-10-CM | POA: Diagnosis present

## 2017-05-11 DIAGNOSIS — I5043 Acute on chronic combined systolic (congestive) and diastolic (congestive) heart failure: Secondary | ICD-10-CM | POA: Diagnosis not present

## 2017-05-11 DIAGNOSIS — R0682 Tachypnea, not elsewhere classified: Secondary | ICD-10-CM | POA: Diagnosis not present

## 2017-05-11 DIAGNOSIS — R609 Edema, unspecified: Secondary | ICD-10-CM | POA: Diagnosis not present

## 2017-05-11 DIAGNOSIS — R7989 Other specified abnormal findings of blood chemistry: Secondary | ICD-10-CM

## 2017-05-11 DIAGNOSIS — I130 Hypertensive heart and chronic kidney disease with heart failure and stage 1 through stage 4 chronic kidney disease, or unspecified chronic kidney disease: Secondary | ICD-10-CM | POA: Diagnosis not present

## 2017-05-11 DIAGNOSIS — R531 Weakness: Secondary | ICD-10-CM | POA: Insufficient documentation

## 2017-05-11 DIAGNOSIS — D649 Anemia, unspecified: Secondary | ICD-10-CM | POA: Diagnosis present

## 2017-05-11 DIAGNOSIS — E785 Hyperlipidemia, unspecified: Secondary | ICD-10-CM | POA: Diagnosis present

## 2017-05-11 LAB — BLOOD GAS, ARTERIAL
Acid-Base Excess: 2.2 mmol/L — ABNORMAL HIGH (ref 0.0–2.0)
Bicarbonate: 26.3 mmol/L (ref 20.0–28.0)
DRAWN BY: 28459
FIO2: 21
O2 Saturation: 91.3 %
PATIENT TEMPERATURE: 37
PH ART: 7.436 (ref 7.350–7.450)
pCO2 arterial: 39.3 mmHg (ref 32.0–48.0)
pO2, Arterial: 66.4 mmHg — ABNORMAL LOW (ref 83.0–108.0)

## 2017-05-11 LAB — RAPID URINE DRUG SCREEN, HOSP PERFORMED
Amphetamines: NOT DETECTED
Barbiturates: NOT DETECTED
Benzodiazepines: NOT DETECTED
COCAINE: NOT DETECTED
OPIATES: NOT DETECTED
TETRAHYDROCANNABINOL: NOT DETECTED

## 2017-05-11 LAB — CBC WITH DIFFERENTIAL/PLATELET
BASOS ABS: 0 10*3/uL (ref 0.0–0.1)
Basophils Relative: 0 %
Eosinophils Absolute: 0.1 10*3/uL (ref 0.0–0.7)
Eosinophils Relative: 2 %
HEMATOCRIT: 35.5 % — AB (ref 39.0–52.0)
Hemoglobin: 10.7 g/dL — ABNORMAL LOW (ref 13.0–17.0)
LYMPHS PCT: 20 %
Lymphs Abs: 0.9 10*3/uL (ref 0.7–4.0)
MCH: 25.9 pg — ABNORMAL LOW (ref 26.0–34.0)
MCHC: 30.1 g/dL (ref 30.0–36.0)
MCV: 86 fL (ref 78.0–100.0)
MONO ABS: 0.5 10*3/uL (ref 0.1–1.0)
MONOS PCT: 10 %
NEUTROS ABS: 3 10*3/uL (ref 1.7–7.7)
Neutrophils Relative %: 68 %
Platelets: 182 10*3/uL (ref 150–400)
RBC: 4.13 MIL/uL — ABNORMAL LOW (ref 4.22–5.81)
RDW: 17.1 % — AB (ref 11.5–15.5)
WBC: 4.5 10*3/uL (ref 4.0–10.5)

## 2017-05-11 LAB — BASIC METABOLIC PANEL
ANION GAP: 8 (ref 5–15)
BUN: 20 mg/dL (ref 6–20)
CALCIUM: 8.5 mg/dL — AB (ref 8.9–10.3)
CO2: 27 mmol/L (ref 22–32)
Chloride: 103 mmol/L (ref 101–111)
Creatinine, Ser: 1.27 mg/dL — ABNORMAL HIGH (ref 0.61–1.24)
GFR calc Af Amer: >60 mL/min (ref >60)
GFR calc non Af Amer: >60 mL/min (ref >60)
GLUCOSE: 97 mg/dL (ref 65–99)
Potassium: 3.3 mmol/L — ABNORMAL LOW (ref 3.5–5.1)
Sodium: 138 mmol/L (ref 135–145)

## 2017-05-11 LAB — URINALYSIS, ROUTINE W REFLEX MICROSCOPIC
Bilirubin Urine: NEGATIVE
GLUCOSE, UA: NEGATIVE mg/dL
Hgb urine dipstick: NEGATIVE
KETONES UR: NEGATIVE mg/dL
LEUKOCYTES UA: NEGATIVE
Nitrite: NEGATIVE
PH: 6 (ref 5.0–8.0)
Protein, ur: NEGATIVE mg/dL
SPECIFIC GRAVITY, URINE: 1.006 (ref 1.005–1.030)

## 2017-05-11 LAB — TROPONIN I
TROPONIN I: 0.03 ng/mL — AB (ref <0.03)
Troponin I: 0.03 ng/mL (ref <0.03)
Troponin I: <0.03 ng/mL (ref <0.03)

## 2017-05-11 LAB — BRAIN NATRIURETIC PEPTIDE: B Natriuretic Peptide: 487 pg/mL — ABNORMAL HIGH (ref 0.0–100.0)

## 2017-05-11 LAB — ETHANOL

## 2017-05-11 LAB — MAGNESIUM: MAGNESIUM: 1.9 mg/dL (ref 1.7–2.4)

## 2017-05-11 LAB — SALICYLATE LEVEL: Salicylate Lvl: <7 mg/dL (ref 2.8–30.0)

## 2017-05-11 LAB — ACETAMINOPHEN LEVEL

## 2017-05-11 IMAGING — DX DG CHEST 2V
2 series · 2 of 2 positions shown · non-contrast
Comparison: Chest radiograph performed 09/03/2015

CLINICAL DATA: Acute onset of shortness of breath and bilateral
lower extremity swelling. Initial encounter.

EXAM:
CHEST  2 VIEW

[chest pa]
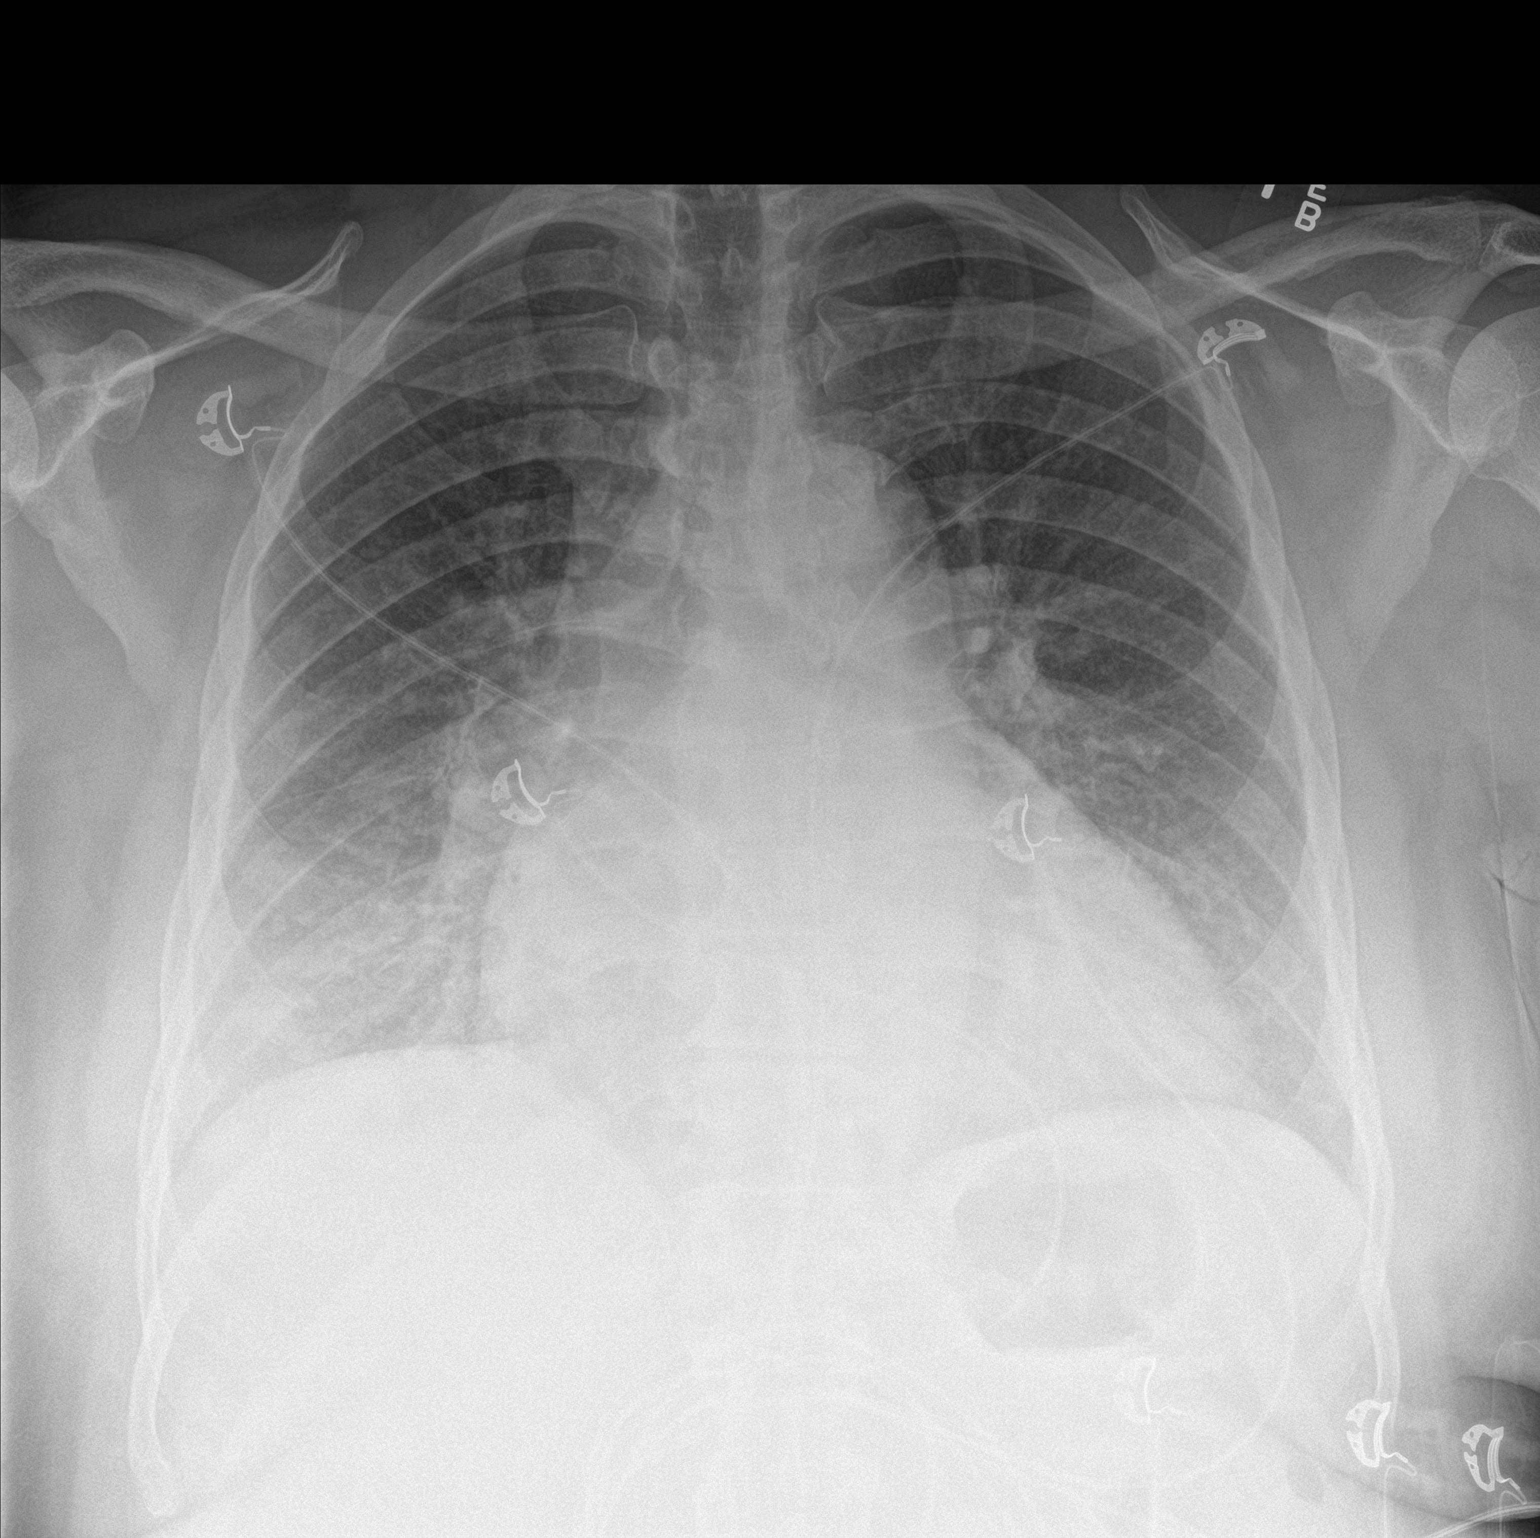

[chest lat]
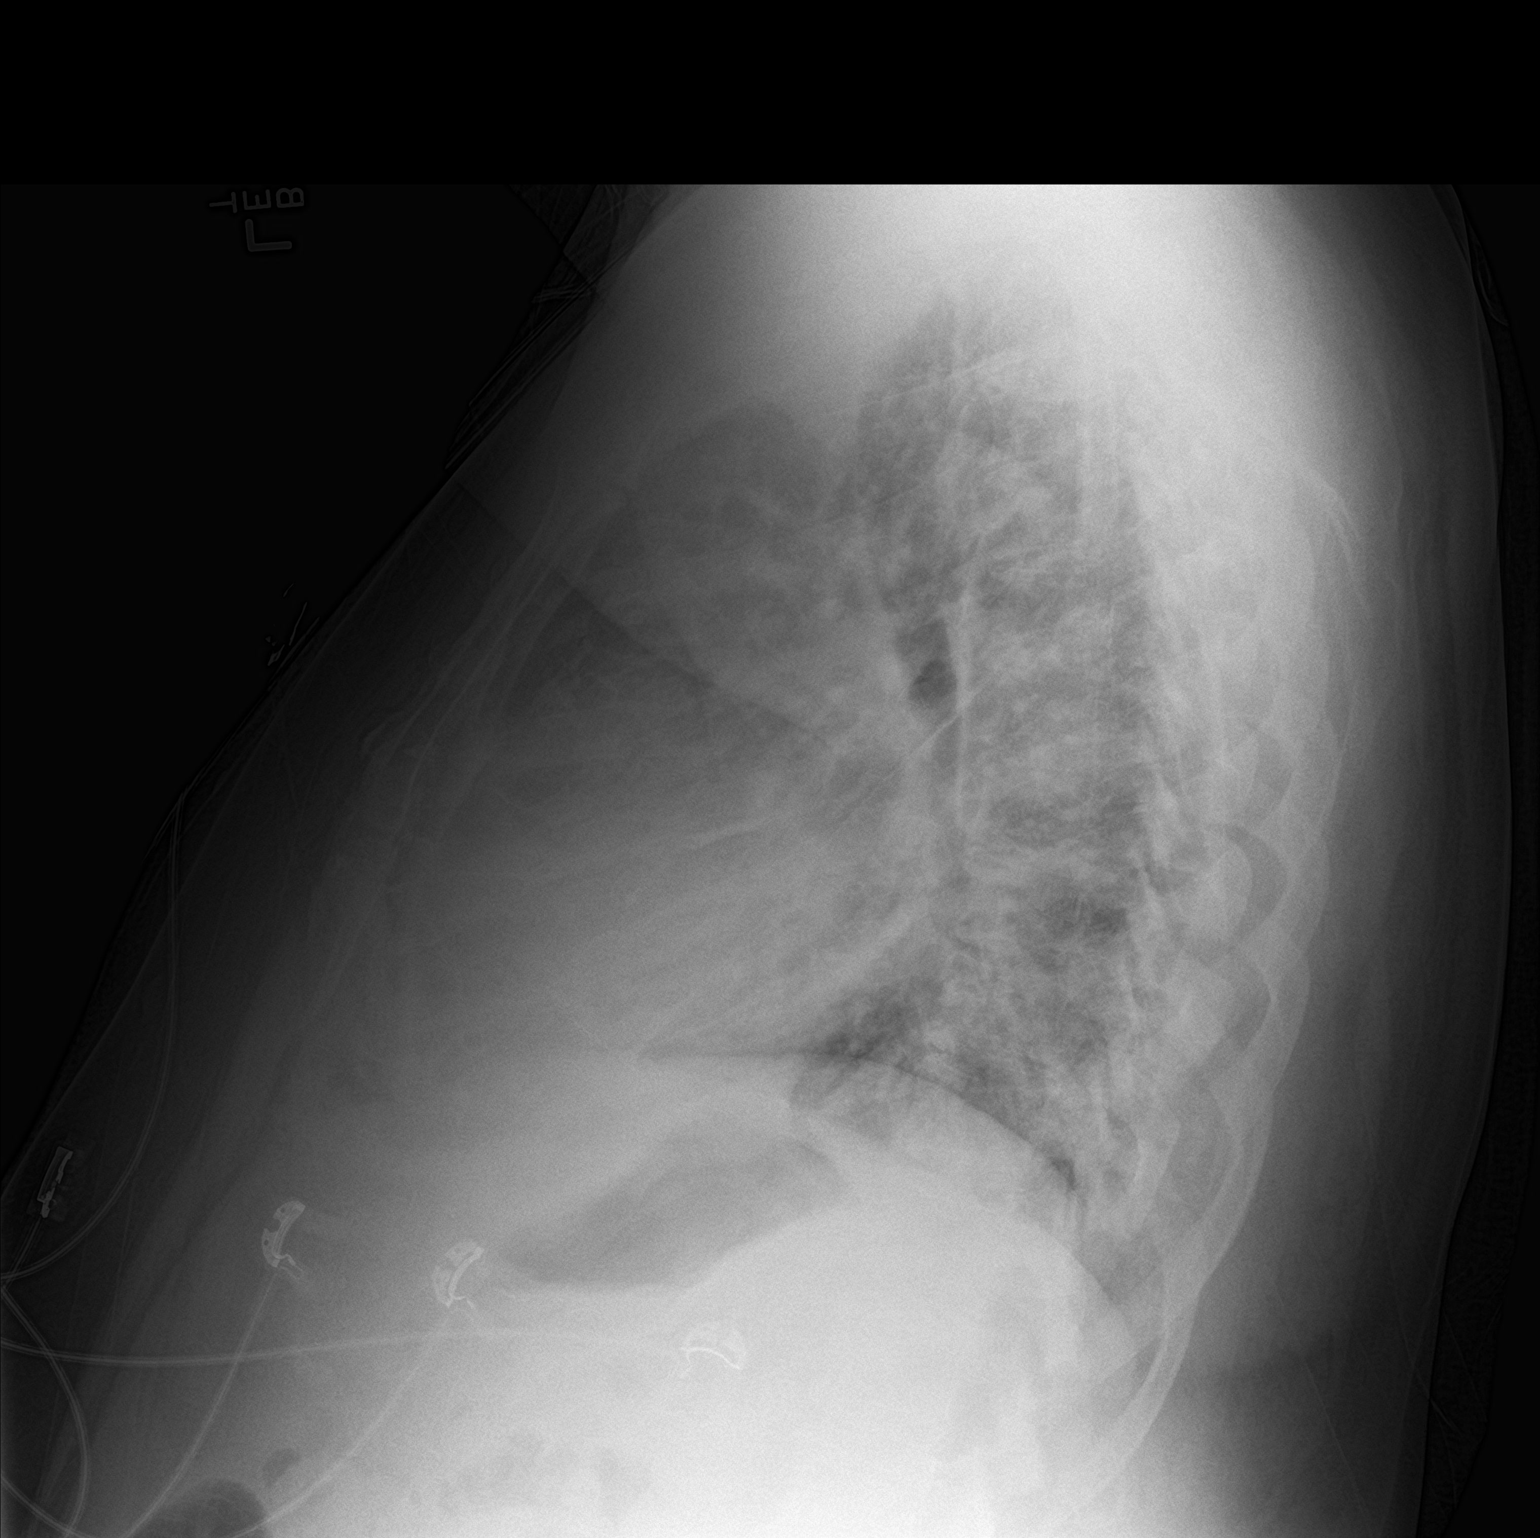

[2 of 2 positions shown; findings below may reference images not displayed]

FINDINGS: The lungs are well-aerated. Vascular congestion is noted. Bibasilar
airspace opacities may reflect mild interstitial edema. There is no
evidence of pleural effusion or pneumothorax.

The heart is enlarged.  No acute osseous abnormalities are seen.
IMPRESSION: Vascular congestion and cardiomegaly. Bibasilar airspace opacities
may reflect mild interstitial edema.

## 2017-05-11 MED ORDER — TAMSULOSIN HCL 0.4 MG PO CAPS
0.4000 mg | ORAL_CAPSULE | Freq: Every day | ORAL | Status: DC
  Administered 2017-05-11 – 2017-05-13 (×3): 0.4 mg via ORAL
  Filled 2017-05-11 (×3): qty 1

## 2017-05-11 MED ORDER — ORAL CARE MOUTH RINSE
15.0000 mL | Freq: Two times a day (BID) | OROMUCOSAL | Status: DC
  Administered 2017-05-11 – 2017-05-12 (×4): 15 mL via OROMUCOSAL

## 2017-05-11 MED ORDER — IPRATROPIUM BROMIDE 0.02 % IN SOLN
0.5000 mg | Freq: Four times a day (QID) | RESPIRATORY_TRACT | Status: DC
  Administered 2017-05-11: 0.5 mg via RESPIRATORY_TRACT
  Filled 2017-05-11: qty 2.5

## 2017-05-11 MED ORDER — IPRATROPIUM BROMIDE 0.02 % IN SOLN: 0.5000 mg | Freq: Four times a day (QID) | RESPIRATORY_TRACT | Status: DC | PRN

## 2017-05-11 MED ORDER — POTASSIUM CHLORIDE CRYS ER 20 MEQ PO TBCR
20.0000 meq | EXTENDED_RELEASE_TABLET | Freq: Every day | ORAL | Status: DC
  Administered 2017-05-11 – 2017-05-13 (×3): 20 meq via ORAL
  Filled 2017-05-11 (×3): qty 1

## 2017-05-11 MED ORDER — ASPIRIN 81 MG PO CHEW
81.0000 mg | CHEWABLE_TABLET | Freq: Every day | ORAL | Status: DC
  Administered 2017-05-11 – 2017-05-13 (×3): 81 mg via ORAL
  Filled 2017-05-11 (×3): qty 1

## 2017-05-11 MED ORDER — ATORVASTATIN CALCIUM 40 MG PO TABS
40.0000 mg | ORAL_TABLET | Freq: Every day | ORAL | Status: DC
  Administered 2017-05-11 – 2017-05-12 (×2): 40 mg via ORAL
  Filled 2017-05-11 (×2): qty 1

## 2017-05-11 MED ORDER — LEVALBUTEROL HCL 1.25 MG/0.5ML IN NEBU
1.2500 mg | INHALATION_SOLUTION | Freq: Four times a day (QID) | RESPIRATORY_TRACT | Status: DC
  Administered 2017-05-11: 1.25 mg via RESPIRATORY_TRACT
  Filled 2017-05-11: qty 0.5

## 2017-05-11 MED ORDER — LEVALBUTEROL HCL 1.25 MG/0.5ML IN NEBU: 1.2500 mg | INHALATION_SOLUTION | Freq: Four times a day (QID) | RESPIRATORY_TRACT | Status: DC | PRN

## 2017-05-11 MED ORDER — PANTOPRAZOLE SODIUM 40 MG PO TBEC
40.0000 mg | DELAYED_RELEASE_TABLET | Freq: Every evening | ORAL | Status: DC
  Administered 2017-05-11 – 2017-05-12 (×2): 40 mg via ORAL
  Filled 2017-05-11 (×2): qty 1

## 2017-05-11 MED ORDER — SPIRONOLACTONE 25 MG PO TABS
50.0000 mg | ORAL_TABLET | Freq: Every day | ORAL | Status: DC
  Administered 2017-05-11 – 2017-05-13 (×3): 50 mg via ORAL
  Filled 2017-05-11 (×3): qty 2

## 2017-05-11 MED ORDER — BUDESONIDE 0.25 MG/2ML IN SUSP
0.2500 mg | Freq: Two times a day (BID) | RESPIRATORY_TRACT | Status: DC
  Administered 2017-05-11 – 2017-05-13 (×5): 0.25 mg via RESPIRATORY_TRACT
  Filled 2017-05-11 (×5): qty 2

## 2017-05-11 MED ORDER — FERROUS GLUCONATE 324 (38 FE) MG PO TABS
324.0000 mg | ORAL_TABLET | Freq: Every day | ORAL | Status: DC
  Administered 2017-05-11 – 2017-05-13 (×3): 324 mg via ORAL
  Filled 2017-05-11 (×4): qty 1

## 2017-05-11 MED ORDER — FUROSEMIDE 10 MG/ML IJ SOLN
60.0000 mg | Freq: Two times a day (BID) | INTRAMUSCULAR | Status: DC
  Administered 2017-05-11 – 2017-05-13 (×4): 60 mg via INTRAVENOUS
  Filled 2017-05-11 (×4): qty 6

## 2017-05-11 MED ORDER — LISINOPRIL 5 MG PO TABS
5.0000 mg | ORAL_TABLET | Freq: Every day | ORAL | Status: DC
  Administered 2017-05-11 – 2017-05-13 (×3): 5 mg via ORAL
  Filled 2017-05-11 (×3): qty 1

## 2017-05-11 MED ORDER — CARVEDILOL 12.5 MG PO TABS
25.0000 mg | ORAL_TABLET | Freq: Two times a day (BID) | ORAL | Status: DC
  Administered 2017-05-11 – 2017-05-13 (×5): 25 mg via ORAL
  Filled 2017-05-11 (×5): qty 2

## 2017-05-11 MED ORDER — DOCUSATE SODIUM 100 MG PO CAPS: 100.0000 mg | ORAL_CAPSULE | Freq: Two times a day (BID) | ORAL | Status: DC | PRN

## 2017-05-11 MED ORDER — RIVAROXABAN 20 MG PO TABS
20.0000 mg | ORAL_TABLET | Freq: Every day | ORAL | Status: DC
  Administered 2017-05-11 – 2017-05-12 (×2): 20 mg via ORAL
  Filled 2017-05-11 (×2): qty 1

## 2017-05-11 MED ORDER — BECLOMETHASONE DIPROPIONATE 80 MCG/ACT IN AERS: 2.0000 | INHALATION_SPRAY | Freq: Two times a day (BID) | RESPIRATORY_TRACT | Status: DC

## 2017-05-11 MED ORDER — FUROSEMIDE 10 MG/ML IJ SOLN
40.0000 mg | Freq: Once | INTRAMUSCULAR | Status: AC
  Administered 2017-05-11: 40 mg via INTRAVENOUS
  Filled 2017-05-11: qty 4

## 2017-05-11 MED ORDER — POTASSIUM CHLORIDE CRYS ER 20 MEQ PO TBCR: 40.0000 meq | EXTENDED_RELEASE_TABLET | Freq: Once | ORAL | Status: AC

## 2017-05-11 NOTE — Progress Notes (Signed)
Patient briefly seen and examined, database reviewed.  Well-known to [place] for frequent admissions is due to acute on chronic systolic heart failure with well-known medication and diet noncompliance.  He was in fact admitted on 11/18 and left AMA early the next morning when the nurse refused to allow him to drink a 1 L bottle of water.  He returns with confusion which rapidly improved with diuresis as well as significant volume overload.  He is already 2.2 L negative, continue IV Lasix.  Peggye Pitt, MD [facility] Pager: [phone]

## 2017-05-11 NOTE — ED Provider Notes (Signed)
[facility] EMERGENCY DEPARTMENT Provider Note   CSN: [id number] Arrival date & time: 05/11/17  7510     History   Chief Complaint Chief Complaint  Patient presents with  . Weakness    HPI [name] is a 51 y.o. male.  Level 5 caveat for altered mental status.  Patient well-known to the ED.  Has history of nonischemic cardiomyopathy with EF of 35-40%, atrial fibrillation on Xarelto, hypertension and asthma and has frequent admissions for CHF exacerbations.  He left AMA from the hospital 24 hours ago.  He returns tonight by EMS with confusion, shortness of breath and altered mental status.  He appears more obtunded than he is at baseline.  He denies taking any pain medication.  He denies any fever.  He states he has mild shortness of breath and coughing.  Denies chest pain, abdominal pain or headache.  Denies any falls or trauma.  He states he feels generally weak all over and is having a hard time walking.  No vomiting or diarrhea.   The history is provided by the patient and the EMS personnel. The history is limited by the condition of the patient.  Weakness     Past Medical History:  Diagnosis Date  . Allergic rhinitis   . Asthma   . Atrial fibrillation (HCC)   . CHF (congestive heart failure) (HCC)   . Essential hypertension   . Gastroesophageal reflux disease   . Heart attack (HCC)   . History of cardiac catheterization    Normal coronaries December 2016  . Noncompliance    Major problem leading to declining health and recurrent hospitalization  . Nonischemic cardiomyopathy (HCC)    LVEF 20-25%  . On home O2    3L N/C  . OSA (obstructive sleep apnea)   . Osteoarthritis   . Peptic ulcer disease     Patient Active Problem List   Diagnosis Date Noted  . Acute exacerbation of CHF (congestive heart failure) (HCC) 04/20/2017  . Acute on chronic systolic (congestive) heart failure (HCC) 04/19/2017  . CHF exacerbation (HCC) 04/11/2017  . Chronic respiratory  failure with hypoxia (HCC) 04/11/2017  . Encounter for hospice care discussion   . Palliative care encounter   . Goals of care, counseling/discussion   . DNR (do not resuscitate)   . DNR (do not resuscitate) discussion   . Chronic congestive heart failure (HCC) 02/18/2017  . Acute systolic heart failure (HCC) 01/18/2017  . Pedal edema 12/02/2016  . Acute CHF (congestive heart failure) (HCC) 11/09/2016  . Acute on chronic systolic CHF (congestive heart failure) (HCC) 08/19/2016  . CKD (chronic kidney disease), stage II 05/17/2016  . Coronary arteries, normal Dec 2016 01/23/2016  . Chronic anticoagulation-Xarelto 01/23/2016  . NSVT (nonsustained ventricular tachycardia) (HCC) 01/23/2016  . Elevated troponin 12/21/2015  . Nonischemic cardiomyopathy (HCC)   . Morbid obesity due to excess calories (HCC)   . Noncompliance with diet and medication regimen 12/02/2015  . OSA (obstructive sleep apnea) 09/20/2015  . Pulmonary hypertension (HCC) 09/19/2015  . Normocytic anemia 09/19/2015  . Atrial fibrillation, chronic (HCC)   . Dyspnea on exertion 05/28/2015  . Acute respiratory failure with hypoxia (HCC) 04/01/2015  . Hypokalemia 04/01/2015  . Obesity hypoventilation syndrome (HCC) 08/08/2014  . GERD (gastroesophageal reflux disease) 08/08/2014  . Edema of right lower extremity 06/21/2014  . Fecal urgency 06/21/2014  . Asthma 02/11/2009  . Hyperlipidemia 07/12/2008  . OBESITY, MORBID 07/12/2008  . Anxiety state 07/12/2008  . DEPRESSION 07/12/2008  .  Essential hypertension 07/12/2008  . ALLERGIC RHINITIS 07/12/2008  . Peptic ulcer 07/12/2008  . OSTEOARTHRITIS 07/12/2008    Past Surgical History:  Procedure Laterality Date  . Right/Left Heart Cath and Coronary Angiography N/A 06/14/2015   Performed by Runell Gess, MD at [facility] INVASIVE CV LAB       Home Medications    Prior to Admission medications   Medication Sig Start Date End Date Taking? Authorizing Provider    albuterol (PROVENTIL) (2.5 MG/3ML) 0.083% nebulizer solution Take 2.5 mg by nebulization every 6 (six) hours as needed for wheezing or shortness of breath.    [provider]  aspirin 81 MG chewable tablet Chew 1 tablet (81 mg total) by mouth daily. 04/13/17   Erick Blinks, MD  atorvastatin (LIPITOR) 40 MG tablet Take 1 tablet (40 mg total) by mouth daily at 6 PM. 06/25/16   Kathlen Mody, MD  beclomethasone (QVAR) 80 MCG/ACT inhaler Inhale 2 puffs into the lungs 2 (two) times daily.     [provider]  bumetanide (BUMEX) 2 MG tablet Take 1 tablet (2 mg total) by mouth 2 (two) times daily. 11/13/16   Catarina Hartshorn, MD  carvedilol (COREG) 25 MG tablet Take 1 tablet (25 mg total) by mouth 2 (two) times daily with a meal. 11/13/16   Tat, Onalee Hua, MD  docusate sodium (COLACE) 100 MG capsule Take 1 capsule (100 mg total) by mouth 2 (two) times daily. Patient taking differently: Take 100 mg by mouth 2 (two) times daily as needed for mild constipation.  06/25/16   Kathlen Mody, MD  ferrous gluconate (FERGON) 324 MG tablet Take 1 tablet (324 mg total) by mouth 2 (two) times daily with a meal. Patient taking differently: Take 324 mg by mouth daily with breakfast.  06/25/16   Kathlen Mody, MD  lisinopril (PRINIVIL,ZESTRIL) 5 MG tablet Take 1 tablet (5 mg total) by mouth daily. 04/13/17   Erick Blinks, MD  OXYGEN Inhale 3.5 L into the lungs daily.    [provider]  pantoprazole (PROTONIX) 40 MG tablet Take 1 tablet (40 mg total) by mouth daily at 6 (six) AM. Patient taking differently: Take 40 mg by mouth every evening.  06/26/16   Kathlen Mody, MD  potassium chloride (K-DUR,KLOR-CON) 20 MEQ tablet Take 1 tablet (20 mEq total) by mouth daily. 02/20/17   Houston Siren, MD  rivaroxaban (XARELTO) 20 MG TABS tablet Take 1 tablet (20 mg total) by mouth daily with breakfast. Patient taking differently: Take 20 mg by mouth daily with supper.  06/26/16   Kathlen Mody, MD  spironolactone (ALDACTONE)  50 MG tablet Take 50 mg daily by mouth. For fluid 03/18/17   [provider]  tamsulosin (FLOMAX) 0.4 MG CAPS capsule Take 1 capsule (0.4 mg total) by mouth daily. 06/25/16   Kathlen Mody, MD    Family History Family History  Problem Relation Age of Onset  . Stroke Father   . Heart attack Father   . Aneurysm Mother        Cerebral aneurysm  . Hypertension Sister   . Colon cancer Neg Hx   . Inflammatory bowel disease Neg Hx   . Liver disease Neg Hx     Social History Social History   Tobacco Use  . Smoking status: Former Smoker    Packs/day: 0.50    Years: 20.00    Pack years: 10.00    Types: Cigarettes    Start date: 04/26/1988    Last attempt to quit: 06/23/2007  Years since quitting: 9.8  . Smokeless tobacco: Never Used  . Tobacco comment: 1 ppd former smoker  Substance Use Topics  . Alcohol use: No    Alcohol/week: 0.0 oz    Comment: No etoh since 2009  . Drug use: No     Allergies   Banana; Bee venom; Food; Aspirin; Metolazone; Oatmeal; Orange juice [orange oil]; Torsemide; Diltiazem; Hydralazine; and Lipitor [atorvastatin]   Review of Systems Review of Systems  Unable to perform ROS: Mental status change  Neurological: Positive for weakness.     Physical Exam Updated Vital Signs BP (!) 138/100 (BP Location: Right Arm)   Pulse 93   Temp 98.4 F (36.9 C) (Oral)   Resp 20   Ht 6' (1.829 m)   Wt (!) 158.8 kg (350 lb)   SpO2 95%   BMI 47.47 kg/m   Physical Exam  Constitutional: He appears well-developed and well-nourished. No distress.  Obtunded. Arouses to voice. Answers some questions appropriately. Oriented x2  HENT:  Head: Normocephalic and atraumatic.  Mouth/Throat: Oropharynx is clear and moist. No oropharyngeal exudate.  Eyes: Conjunctivae and EOM are normal. Pupils are equal, round, and reactive to light.  Neck: Normal range of motion. Neck supple.  No meningismus.  Cardiovascular: Normal rate, regular rhythm, normal heart sounds  and intact distal pulses.  No murmur heard. Pulmonary/Chest: Effort normal. No respiratory distress. He has rales.  Mild tachypnea.   Abdominal: Soft. There is no tenderness. There is no rebound and no guarding.  Musculoskeletal: Normal range of motion. He exhibits edema. He exhibits no tenderness.  +3 edema to knees  Neurological: He is alert. No cranial nerve deficit. He exhibits normal muscle tone. Coordination normal.  No ataxia on finger to nose bilaterally. No pronator drift. 5/5 strength throughout. CN 2-12 intact.Equal grip strength. Sensation intact.   Skin: Skin is warm. Capillary refill takes less than 2 seconds.  Psychiatric: He has a normal mood and affect. His behavior is normal.  Nursing note and vitals reviewed.    ED Treatments / Results  Labs (all labs ordered are listed, but only abnormal results are displayed) Labs Reviewed  CBC WITH DIFFERENTIAL/PLATELET - Abnormal; Notable for the following components:      Result Value   RBC 4.13 (*)    Hemoglobin 10.7 (*)    HCT 35.5 (*)    MCH 25.9 (*)    RDW 17.1 (*)    All other components within normal limits  BASIC METABOLIC PANEL - Abnormal; Notable for the following components:   Potassium 3.3 (*)    Creatinine, Ser 1.27 (*)    Calcium 8.5 (*)    All other components within normal limits  TROPONIN I - Abnormal; Notable for the following components:   Troponin I 0.03 (*)    All other components within normal limits  BRAIN NATRIURETIC PEPTIDE - Abnormal; Notable for the following components:   B Natriuretic Peptide 487.0 (*)    All other components within normal limits  ACETAMINOPHEN LEVEL - Abnormal; Notable for the following components:   Acetaminophen (Tylenol), Serum <10 (*)    All other components within normal limits  BLOOD GAS, ARTERIAL - Abnormal; Notable for the following components:   pO2, Arterial 66.4 (*)    Acid-Base Excess 2.2 (*)    All other components within normal limits  SALICYLATE LEVEL    ETHANOL  RAPID URINE DRUG SCREEN, HOSP PERFORMED  URINALYSIS, ROUTINE W REFLEX MICROSCOPIC    EKG  EKG Interpretation  Date/Time:  Tuesday May 11 2017 02:48:12 EST Ventricular Rate:  90 PR Interval:    QRS Duration: 96 QT Interval:  404 QTC Calculation: 495 R Axis:   74 Text Interpretation:  Atrial fibrillation Ventricular premature complex Nonspecific T abnormalities, lateral leads Borderline prolonged QT interval No significant change was found Confirmed by Glynn Octaveancour, Takashi Korol [phone]([id number]) on 05/11/2017 3:00:56 AM       Radiology Dg Chest 2 View  Result Date: 05/11/2017 CLINICAL DATA:  51 year old male with shortness of breath. EXAM: CHEST  2 VIEW COMPARISON:  Chest radiograph dated 05/09/2017 FINDINGS: There is stable cardiomegaly with mild vascular congestion. No focal consolidation, pleural effusion, or pneumothorax. No acute osseous pathology. IMPRESSION: Cardiomegaly with mild vascular congestion similar to prior radiograph. Clinical correlation and follow-up recommended. Electronically Signed   By: Elgie CollardArash  Radparvar M.D.   On: 05/11/2017 03:50   Ct Head Wo Contrast  Result Date: 05/11/2017 CLINICAL DATA:  Generalize weakness. EXAM: CT HEAD WITHOUT CONTRAST TECHNIQUE: Contiguous axial images were obtained from the base of the skull through the vertex without intravenous contrast. COMPARISON:  01/17/2016 FINDINGS: Brain: No evidence of acute infarction, hemorrhage, hydrocephalus, extra-axial collection or mass lesion/mass effect. Vascular: No hyperdense vessel or unexpected calcification. Skull: Normal. Negative for fracture or focal lesion. Sinuses/Orbits: Retention cysts in the left maxillary antrum. Paranasal sinuses and mastoid air cells are otherwise clear. Other: None. IMPRESSION: No acute intracranial abnormalities. Electronically Signed   By: Burman NievesWilliam  Stevens M.D.   On: 05/11/2017 03:48    Procedures Procedures (including critical care time)  Medications Ordered in  ED Medications - No data to display   Initial Impression / Assessment and Plan / ED Course  I have reviewed the triage vital signs and the nursing notes.  Pertinent labs & imaging results that were available during my care of the patient were reviewed by me and considered in my medical decision making (see chart for details).    Patient well-known to the ED.  Frequent presentations for decompensated heart failure.  He appears more confused and slower to respond than baseline.  We will obtain labs, ABG, CT head given his history of anticoagulation.  CT head is negative.  ABG shows no significant CO2 retention.  Minimal troponin elevation similar to previous Chest x-ray is stable.  Patient given IV Lasix.  His mental status is started to improve.  He denies taking any medications that he was not prescribed.  States he is tired because of his heart failure.  He denies chest pain.  He is now awake and alert and answering questions appropriately.  Patient is dyspneic with conversation and states he continues to feel short of breath.  He denies taking any medications he was not prescribed.  He states he is willing to come back in the hospital and will stay.  Interestingly echocardiogram performed 2 days ago showed improvement in his EF.  Uncertain etiology of patient's altered mental status. UA and UDS pending. He appears improved and closer to his baseline at this time. He still feels short of breath.  Observation admission discussed with Dr. Robb Matarrtiz.  Final Clinical Impressions(s) / ED Diagnoses   Final diagnoses:  Altered mental status, unspecified altered mental status type    ED Discharge Orders    None       Tekeshia Klahr, Jeannett SeniorStephen, MD 05/11/17 [phone]

## 2017-05-11 NOTE — ED Triage Notes (Signed)
Pt brought in by [facility] for c/o generalized weakness;  Pt left AMA from here yesterday because he was upset that the nurses would not give him some water;

## 2017-05-11 NOTE — Care Management Obs Status (Signed)
MEDICARE OBSERVATION STATUS NOTIFICATION   Patient Details  Name: [name] MRN: [id number] Date of Birth: 02/13/1966   Medicare Observation Status Notification Given:  Yes    [name], RN 05/11/2017, 10:44 AM

## 2017-05-11 NOTE — ED Notes (Signed)
Patient transported to CT 

## 2017-05-11 NOTE — H&P (Signed)
History and Physical    [name] ZOX:[id number] DOB: Dec 03, 1965 DOA: 05/11/2017  PCP: Pearson Grippe, MD   Patient coming from: Home.  I have personally briefly reviewed patient's old medical records in [facility]  Chief Complaint: AMS.   HPI: [name] is a 51 y.o. male well-known to the hospitalist service, with medical history significant of allergic rhinitis, asthma, mitral fibrillation, chronic systolic CHF, essential hypertension, GERD, PUD, history of MI with normal coronaries in December 2016, treatment noncompliance, OSA not on CPAP, osteoarthritis who was brought to the emergency department via EMS due to progressively worse dyspnea, dry cough, sore throat, wheezing, worsening lower extremity edema, PND and orthopnea for the past 2-3 days.   He was recently admitted from 10/21 to 04/13/2017 to this facility due to acute on chronic systolic CHF. After discharge, he states that he was seen at [facility], given IV Lasix and discharged home 2 days later.  He was readmitted on 04/19/2017 until 04/23/2017.  Then he was readmitted on 05/09/2017, but signed AMA yesterday 05/10/2017 after getting upset at the nursing staff for not giving him water, despite study was explained to him that he was on fluid restriction.  He otherwise states that he has been compliant with medications and diet.  EMS reported that the patient was initially altered and hypoxic, but that has resolved after the patient has been given treatment in the emergency department.  He is currently awake, alert and oriented x4.  He denies fever, headache, rhinorrhea, chest pain, palpitations, dizziness,  diaphoresis, abdominal pain, nausea, emesis, diarrhea, constipation, melena or hematochezia. He complains of urinating "a lot" after being given Lasix, but denies GU symptoms otherwise. Denies polyphagia, polydipsia, polyuria or blurred vision. The patient states he gets frequent pruritus of the lower  extremities.  ED Course: Initial vital signs temperature 98.24F, pulse 93, respirations 20, blood pressure 138/100 mmHg and O2 sat 95% on room air. He was provided supplemental oxygen and furosemide 40 mg IVP 1 dose. I also ordered K Dur 40 mEq by mouth 1 dose and Xopenex 1.25+ Atrovent 0.5 mg  nebulizer 1.   His workup shows negative urine toxicology, urinalysis, normal alcohol, acetaminophen and aspirin levels.  He has a mildly elevated troponin of 0.03 ng/mL and a BNP of 487 pg/mL. His EKG shows atrial fibrillation with RVR with a ventricular rate of 105 BPM and nonspecific T-wave abnormalities. Not significantly changed from previous. ABG showed a pH of 7.36, PCO2 of 48, all other values were unremarkable except for a PCO2 of 44.6 mmHg. His chest radiograph showed cardiomegaly and pulmonary vascular congestion. Please see image and radiology report for further detail.  Review of Systems: As per HPI otherwise 10 point review of systems negative.   Past Medical History:  Diagnosis Date  . Allergic rhinitis   . Asthma   . Atrial fibrillation (HCC)   . CHF (congestive heart failure) (HCC)   . Essential hypertension   . Gastroesophageal reflux disease   . Heart attack (HCC)   . History of cardiac catheterization    Normal coronaries December 2016  . Noncompliance    Major problem leading to declining health and recurrent hospitalization  . Nonischemic cardiomyopathy (HCC)    LVEF 20-25%  . On home O2    3L N/C  . OSA (obstructive sleep apnea)   . Osteoarthritis   . Peptic ulcer disease     Past Surgical History:  Procedure  Laterality Date  . Right/Left Heart Cath and Coronary Angiography N/A 06/14/2015   Performed by Runell Gess, MD at [facility] INVASIVE CV LAB     reports that he quit smoking about 9 years ago. His smoking use included cigarettes. He started smoking about 29 years ago. He has a 10.00 pack-year smoking history. he has never used smokeless tobacco. He reports  that he does not drink alcohol or use drugs.  Allergies  Allergen Reactions  . Banana Shortness Of Breath  . Bee Venom Shortness Of Breath, Swelling and Other (See Comments)    Reaction:  Facial swelling  . Food Shortness Of Breath, Rash and Other (See Comments)    Pt states that he is allergic to strawberries.    . Aspirin Other (See Comments)    Reaction:  GI upset   . Metolazone Other (See Comments)    Pt states that he stopped taking this med due to heart attack like symptoms.   . Oatmeal Nausea And Vomiting  . Orange Juice [Orange Oil] Nausea And Vomiting    All acidic products make him nauseous and upset stomach  . Torsemide Swelling and Other (See Comments)    Reaction:  Swelling of feet/legs   . Diltiazem Palpitations  . Hydralazine Palpitations  . Lipitor [Atorvastatin] Other (See Comments)    Reaction:  Nose bleeds     Family History  Problem Relation Age of Onset  . Stroke Father   . Heart attack Father   . Aneurysm Mother        Cerebral aneurysm  . Hypertension Sister   . Colon cancer Neg Hx   . Inflammatory bowel disease Neg Hx   . Liver disease Neg Hx     Prior to Admission medications   Medication Sig Start Date End Date Taking? Authorizing Provider  albuterol (PROVENTIL) (2.5 MG/3ML) 0.083% nebulizer solution Take 2.5 mg by nebulization every 6 (six) hours as needed for wheezing or shortness of breath.    [provider]  aspirin 81 MG chewable tablet Chew 1 tablet (81 mg total) by mouth daily. 04/13/17   Erick Blinks, MD  atorvastatin (LIPITOR) 40 MG tablet Take 1 tablet (40 mg total) by mouth daily at 6 PM. 06/25/16   Kathlen Mody, MD  beclomethasone (QVAR) 80 MCG/ACT inhaler Inhale 2 puffs into the lungs 2 (two) times daily.     [provider]  bumetanide (BUMEX) 2 MG tablet Take 1 tablet (2 mg total) by mouth 2 (two) times daily. 11/13/16   Catarina Hartshorn, MD  carvedilol (COREG) 25 MG tablet Take 1 tablet (25 mg total) by mouth 2 (two)  times daily with a meal. 11/13/16   Tat, Onalee Hua, MD  docusate sodium (COLACE) 100 MG capsule Take 1 capsule (100 mg total) by mouth 2 (two) times daily. Patient taking differently: Take 100 mg by mouth 2 (two) times daily as needed for mild constipation.  06/25/16   Kathlen Mody, MD  ferrous gluconate (FERGON) 324 MG tablet Take 1 tablet (324 mg total) by mouth 2 (two) times daily with a meal. Patient taking differently: Take 324 mg by mouth daily with breakfast.  06/25/16   Kathlen Mody, MD  lisinopril (PRINIVIL,ZESTRIL) 5 MG tablet Take 1 tablet (5 mg total) by mouth daily. 04/13/17   Erick Blinks, MD  OXYGEN Inhale 3.5 L into the lungs daily.    [provider]  pantoprazole (PROTONIX) 40 MG tablet Take 1 tablet (40 mg total) by mouth daily at  6 (six) AM. Patient taking differently: Take 40 mg by mouth every evening.  06/26/16   Kathlen Mody, MD  potassium chloride (K-DUR,KLOR-CON) 20 MEQ tablet Take 1 tablet (20 mEq total) by mouth daily. 02/20/17   Houston Siren, MD  rivaroxaban (XARELTO) 20 MG TABS tablet Take 1 tablet (20 mg total) by mouth daily with breakfast. Patient taking differently: Take 20 mg by mouth daily with supper.  06/26/16   Kathlen Mody, MD  spironolactone (ALDACTONE) 50 MG tablet Take 50 mg daily by mouth. For fluid 03/18/17   [provider]  tamsulosin (FLOMAX) 0.4 MG CAPS capsule Take 1 capsule (0.4 mg total) by mouth daily. 06/25/16   Kathlen Mody, MD    Physical Exam: Vitals:   05/11/17 0530 05/11/17 0600 05/11/17 0628 05/11/17 0630  BP: (!) 142/102 (!) 141/104  (!) 133/115  Pulse: 79 69  81  Resp: 19 19  (!) 21  Temp:      TempSrc:      SpO2: 97% 95% 100% 100%  Weight:      Height:        Constitutional: NAD, calm, comfortable Eyes: PERRL, lids and conjunctivae normal ENMT: Mucous membranes are moist. Posterior pharynx clear of any exudate or lesions. Neck: No JVD, supple, no masses, no thyromegaly Respiratory: Decreased breath sounds on bases  with bilateral wheezing and bibasilar rales. Normal respiratory effort. No accessory muscle use.  Cardiovascular: Irregularly irregular, no murmurs / rubs / gallops. 3+ lower extremities edema. Stage II lymphedema. 2+ pedal pulses. No carotid bruits.  Abdomen: Obese, soft, no tenderness, no masses palpated. No hepatosplenomegaly. Bowel sounds positive.  Musculoskeletal: no clubbing / cyanosis.  Good ROM, no contractures. Normal muscle tone.  Skin: Small ecchymosis areas and extremities. Positive abrassions on lower extremities from scratching. Neurologic: CN 2-12 grossly intact. Sensation intact, DTR normal. Strength 5/5 in all 4.  Psychiatric: Normal judgment and insight. Alert and oriented x 4.   Labs on Admission: I have personally reviewed following labs and imaging studies  CBC: Recent Labs  Lab 05/09/17 0328 05/11/17 0308  WBC 3.6* 4.5  NEUTROABS  --  3.0  HGB 11.1* 10.7*  HCT 36.4* 35.5*  MCV 84.5 86.0  PLT 193 182   Basic Metabolic Panel: Recent Labs  Lab 05/09/17 0328 05/11/17 0308  NA 142 138  K 3.3* 3.3*  CL 108 103  CO2 29 27  GLUCOSE 104* 97  BUN 17 20  CREATININE 1.12 1.27*  CALCIUM 9.1 8.5*  MG  --  1.9   GFR: Estimated Creatinine Clearance: 109.6 mL/min (A) (by C-G formula based on SCr of 1.27 mg/dL (H)). Liver Function Tests: No results for input(s): AST, ALT, ALKPHOS, BILITOT, PROT, ALBUMIN in the last 168 hours. No results for input(s): LIPASE, AMYLASE in the last 168 hours. No results for input(s): AMMONIA in the last 168 hours. Coagulation Profile: No results for input(s): INR, PROTIME in the last 168 hours. Cardiac Enzymes: Recent Labs  Lab 05/09/17 0328 05/09/17 0916 05/09/17 1528 05/11/17 0308  TROPONINI 0.04* 0.04* 0.04* 0.03*   BNP (last 3 results) No results for input(s): PROBNP in the last 8760 hours. HbA1C: No results for input(s): HGBA1C in the last 72 hours. CBG: No results for input(s): GLUCAP in the last 168 hours. Lipid  Profile: No results for input(s): CHOL, HDL, LDLCALC, TRIG, CHOLHDL, LDLDIRECT in the last 72 hours. Thyroid Function Tests: No results for input(s): TSH, T4TOTAL, FREET4, T3FREE, THYROIDAB in the last 72 hours. Anemia Panel: No  results for input(s): VITAMINB12, FOLATE, FERRITIN, TIBC, IRON, RETICCTPCT in the last 72 hours. Urine analysis:    Component Value Date/Time   COLORURINE STRAW (A) 05/11/2017 0302   APPEARANCEUR CLEAR 05/11/2017 0302   LABSPEC 1.006 05/11/2017 0302   PHURINE 6.0 05/11/2017 0302   GLUCOSEU NEGATIVE 05/11/2017 0302   GLUCOSEU NEG mg/dL 16/03/9603 5409   HGBUR NEGATIVE 05/11/2017 0302   BILIRUBINUR NEGATIVE 05/11/2017 0302   KETONESUR NEGATIVE 05/11/2017 0302   PROTEINUR NEGATIVE 05/11/2017 0302   UROBILINOGEN 0.2 08/08/2014 1445   NITRITE NEGATIVE 05/11/2017 0302   LEUKOCYTESUR NEGATIVE 05/11/2017 0302    Radiological Exams on Admission: Dg Chest 2 View  Result Date: 05/11/2017 CLINICAL DATA:  51 year old male with shortness of breath. EXAM: CHEST  2 VIEW COMPARISON:  Chest radiograph dated 05/09/2017 FINDINGS: There is stable cardiomegaly with mild vascular congestion. No focal consolidation, pleural effusion, or pneumothorax. No acute osseous pathology. IMPRESSION: Cardiomegaly with mild vascular congestion similar to prior radiograph. Clinical correlation and follow-up recommended. Electronically Signed   By: Elgie Collard M.D.   On: 05/11/2017 03:50   Ct Head Wo Contrast  Result Date: 05/11/2017 CLINICAL DATA:  Generalize weakness. EXAM: CT HEAD WITHOUT CONTRAST TECHNIQUE: Contiguous axial images were obtained from the base of the skull through the vertex without intravenous contrast. COMPARISON:  01/17/2016 FINDINGS: Brain: No evidence of acute infarction, hemorrhage, hydrocephalus, extra-axial collection or mass lesion/mass effect. Vascular: No hyperdense vessel or unexpected calcification. Skull: Normal. Negative for fracture or focal lesion.  Sinuses/Orbits: Retention cysts in the left maxillary antrum. Paranasal sinuses and mastoid air cells are otherwise clear. Other: None. IMPRESSION: No acute intracranial abnormalities. Electronically Signed   By: Burman Nieves M.D.   On: 05/11/2017 03:48    EKG: Independently reviewed. Vent. rate 90 BPM PR interval * ms QRS duration 96 ms QT/QTc 404/495 ms P-R-T axes * 74 106 Atrial fibrillation Ventricular premature complex Nonspecific T abnormalities, lateral leads Borderline prolonged QT interval  Assessment/Plan Principal Problem:   Altered mental status Resolved now. Likely due to medication or transient hypoxia. Continue treatment for CHF and asthma exacerbation.  Active Problems:     Acute on chronic systolic (congestive) heart failure (HCC) Observation/telemetry. Continue supplemental oxygen. Fluid restriction. Check daily weights. Monitor intake and output. Continue ACE inhibitor and beta-blocker. Furosemide 60 mg IVP twice a day. Supplement potassium. Follow-up renal function and electrolytes.    Elevated troponin Minimally elevated, likely due to tachycardia-induced demand ischemia. No chest pain at this time and he is on aspirin and Xarelto. I will recheck level in a.m.    Hyperlipidemia Continue atorvastatin 40 mg by mouth daily. Monitor hepatic function panel as needed.    Essential hypertension Continue carvedilol 25 mg by mouth twice a day in the morning. Continue lisinopril 5 mg p.o. daily. Monitor blood pressure and heart rate.    Asthma Continue supplemental oxygen. Xopenex plus Atrovent every 6 hours when necessary.    GERD (gastroesophageal reflux disease) Protonix 40 mg by mouth daily.    Normocytic anemia Monitor hematocrit and hemoglobin.   Chronic atrial fibrillation (HCC) CHA?DS?-VASc Score of at least 3. Continue Xarelto. Resume carvedilol 25 mg by mouth twice a day  in a.m.     DVT prophylaxis: On Xarelto. Code  Status: Full code for now. Family Communication:  Disposition Plan: Admit for IV diuresis and troponin level trending. Consults called:  Admission status: Observation/telemetry.   Bobette Mo MD [facility] Pager [phone].  If 7PM-7AM, please contact night-coverage [email or web address]  Password [email or web address]  05/11/2017, 7:01 AM

## 2017-05-12 DIAGNOSIS — I482 Chronic atrial fibrillation: Secondary | ICD-10-CM | POA: Diagnosis not present

## 2017-05-12 DIAGNOSIS — I5023 Acute on chronic systolic (congestive) heart failure: Secondary | ICD-10-CM | POA: Diagnosis not present

## 2017-05-12 DIAGNOSIS — R4182 Altered mental status, unspecified: Secondary | ICD-10-CM | POA: Diagnosis not present

## 2017-05-12 LAB — BASIC METABOLIC PANEL
Anion gap: 7 (ref 5–15)
BUN: 20 mg/dL (ref 6–20)
CALCIUM: 8.7 mg/dL — AB (ref 8.9–10.3)
CHLORIDE: 103 mmol/L (ref 101–111)
CO2: 29 mmol/L (ref 22–32)
CREATININE: 1.23 mg/dL (ref 0.61–1.24)
GFR calc non Af Amer: >60 mL/min (ref >60)
Glucose, Bld: 90 mg/dL (ref 65–99)
Potassium: 3.4 mmol/L — ABNORMAL LOW (ref 3.5–5.1)
SODIUM: 139 mmol/L (ref 135–145)

## 2017-05-12 NOTE — Progress Notes (Signed)
PROGRESS NOTE    Tammi KlippelMark T Agramonte  ZOX:[id number] DOB: 01/01/1966 DOA: 05/11/2017 PCP: Pearson GrippeKim, [name], MD     Brief Narrative:  51 year old man admitted to the hospital from home on 11/20.  He is well-known to us for frequent admissions due to acute on chronic systolic heart failure with well-known medication and diet noncompliance.  He was in fact admitted on 11/18 and left AMA early the next morning due to his fluid restriction.  He was admitted with confusion and shortness of breath.  Confusion rapidly resolved.   Assessment & Plan:   Principal Problem:   Altered mental status Active Problems:   Hyperlipidemia   Essential hypertension   Asthma   GERD (gastroesophageal reflux disease)   Hypokalemia   Atrial fibrillation, chronic (HCC)   Normocytic anemia   Elevated troponin   Acute on chronic systolic (congestive) heart failure (HCC)   Acute encephalopathy -Etiology remains unclear but resolved quickly after admission, may be due to hypoxemia due to pulmonary edema.   Acute on chronic systolic heart failure -Echo with known ejection fraction of 55% technically inadequate to evaluate LV diastolic function. -He has diuresed 5.5 L since admission, volume status is improved although still remains markedly volume overloaded. -Continue current Lasix dose of 60 mg IV twice daily -Creatinine is stable to slightly improved today. -On Coreg, lisinopril.  Mildly elevated troponin -This is his baseline, likely due to acute CHF.  Denies chest pain. -No plans for further cardiac workup.  Chronic A. Fib -Rate controlled, anticoagulated on Xarelto.  Essential hypertension -Well-controlled-, continue Coreg and lisinopril.  Hyperlipidemia -Continue atorvastatin.   DVT prophylaxis: Xarelto Code Status: Full code Family Communication: Patient only Disposition Plan: When medically stable, anticipate 48-72 hours  Consultants:   None  Procedures:   None  Antimicrobials:    Anti-infectives (From admission, onward)   None       Subjective: Feels a little improved, less shortness of breath, denies chest pain.  Objective: Vitals:   05/11/17 2049 05/12/17 0551 05/12/17 0751 05/12/17 1507  BP: 112/78 (!) 141/108  127/89  Pulse: 78 85  80  Resp: 16 18  18   Temp: 99 F (37.2 C) 97.7 F (36.5 C)  98.6 F (37 C)  TempSrc: Oral Oral  Oral  SpO2: 100% 100% 96% 99%  Weight:  (!) 152.4 kg (335 lb 14.4 oz)    Height:        Intake/Output Summary (Last 24 hours) at 05/12/2017 1648 Last data filed at 05/12/2017 1200 Gross per 24 hour  Intake 1200 ml  Output 4550 ml  Net -3350 ml   Filed Weights   05/11/17 0249 05/11/17 0746 05/12/17 0551  Weight: (!) 158.8 kg (350 lb) (!) 156.5 kg (345 lb 1.6 oz) (!) 152.4 kg (335 lb 14.4 oz)    Examination:  General exam: Alert, awake, oriented x 3 Respiratory system: Clear to auscultation. Respiratory effort normal. Cardiovascular system:RRR. No murmurs, rubs, gallops. Gastrointestinal system: Abdomen is nondistended, soft and nontender. No organomegaly or masses felt. Normal bowel sounds heard. Central nervous system: Alert and oriented. No focal neurological deficits. Extremities: 3-4+ pitting edema bilaterally Skin: No rashes, lesions or ulcers Psychiatry: Judgement and insight appear normal. Mood & affect appropriate.     Data Reviewed: I have personally reviewed following labs and imaging studies  CBC: Recent Labs  Lab 05/09/17 0328 05/11/17 0308  WBC 3.6* 4.5  NEUTROABS  --  3.0  HGB 11.1* 10.7*  HCT 36.4* 35.5*  MCV 84.5 86.0  PLT 193 182   Basic Metabolic Panel: Recent Labs  Lab 05/09/17 0328 05/11/17 0308 05/12/17 0358  NA 142 138 139  K 3.3* 3.3* 3.4*  CL 108 103 103  CO2 29 27 29   GLUCOSE 104* 97 90  BUN 17 20 20   CREATININE 1.12 1.27* 1.23  CALCIUM 9.1 8.5* 8.7*  MG  --  1.9  --    GFR: Estimated Creatinine Clearance: 110.5 mL/min (by C-G formula based on SCr of 1.23  mg/dL). Liver Function Tests: No results for input(s): AST, ALT, ALKPHOS, BILITOT, PROT, ALBUMIN in the last 168 hours. No results for input(s): LIPASE, AMYLASE in the last 168 hours. No results for input(s): AMMONIA in the last 168 hours. Coagulation Profile: No results for input(s): INR, PROTIME in the last 168 hours. Cardiac Enzymes: Recent Labs  Lab 05/09/17 1528 05/11/17 0308 05/11/17 0859 05/11/17 1547 05/11/17 2106  TROPONINI 0.04* 0.03* 0.03* <0.03 <0.03   BNP (last 3 results) No results for input(s): PROBNP in the last 8760 hours. HbA1C: No results for input(s): HGBA1C in the last 72 hours. CBG: No results for input(s): GLUCAP in the last 168 hours. Lipid Profile: No results for input(s): CHOL, HDL, LDLCALC, TRIG, CHOLHDL, LDLDIRECT in the last 72 hours. Thyroid Function Tests: No results for input(s): TSH, T4TOTAL, FREET4, T3FREE, THYROIDAB in the last 72 hours. Anemia Panel: No results for input(s): VITAMINB12, FOLATE, FERRITIN, TIBC, IRON, RETICCTPCT in the last 72 hours. Urine analysis:    Component Value Date/Time   COLORURINE STRAW (A) 05/11/2017 0302   APPEARANCEUR CLEAR 05/11/2017 0302   LABSPEC 1.006 05/11/2017 0302   PHURINE 6.0 05/11/2017 0302   GLUCOSEU NEGATIVE 05/11/2017 0302   GLUCOSEU NEG mg/dL 09/81/1914 7829   HGBUR NEGATIVE 05/11/2017 0302   BILIRUBINUR NEGATIVE 05/11/2017 0302   KETONESUR NEGATIVE 05/11/2017 0302   PROTEINUR NEGATIVE 05/11/2017 0302   UROBILINOGEN 0.2 08/08/2014 1445   NITRITE NEGATIVE 05/11/2017 0302   LEUKOCYTESUR NEGATIVE 05/11/2017 0302   Sepsis Labs: @LABRCNTIP (procalcitonin:4,lacticidven:4)  )No results found for this or any previous visit (from the past 240 hour(s)).       Radiology Studies: Dg Chest 2 View  Result Date: 05/11/2017 CLINICAL DATA:  51 year old male with shortness of breath. EXAM: CHEST  2 VIEW COMPARISON:  Chest radiograph dated 05/09/2017 FINDINGS: There is stable cardiomegaly with mild  vascular congestion. No focal consolidation, pleural effusion, or pneumothorax. No acute osseous pathology. IMPRESSION: Cardiomegaly with mild vascular congestion similar to prior radiograph. Clinical correlation and follow-up recommended. Electronically Signed   By: Elgie Collard M.D.   On: 05/11/2017 03:50   Ct Head Wo Contrast  Result Date: 05/11/2017 CLINICAL DATA:  Generalize weakness. EXAM: CT HEAD WITHOUT CONTRAST TECHNIQUE: Contiguous axial images were obtained from the base of the skull through the vertex without intravenous contrast. COMPARISON:  01/17/2016 FINDINGS: Brain: No evidence of acute infarction, hemorrhage, hydrocephalus, extra-axial collection or mass lesion/mass effect. Vascular: No hyperdense vessel or unexpected calcification. Skull: Normal. Negative for fracture or focal lesion. Sinuses/Orbits: Retention cysts in the left maxillary antrum. Paranasal sinuses and mastoid air cells are otherwise clear. Other: None. IMPRESSION: No acute intracranial abnormalities. Electronically Signed   By: Burman Nieves M.D.   On: 05/11/2017 03:48        Scheduled Meds: . aspirin  81 mg Oral Daily  . atorvastatin  40 mg Oral q1800  . budesonide (PULMICORT) nebulizer solution  0.25 mg Nebulization BID  . carvedilol  25 mg Oral BID WC  . ferrous gluconate  324 mg Oral Q breakfast  . furosemide  60 mg Intravenous Q12H  . lisinopril  5 mg Oral Daily  . mouth rinse  15 mL Mouth Rinse BID  . pantoprazole  40 mg Oral QPM  . potassium chloride SA  20 mEq Oral Daily  . potassium chloride  40 mEq Oral Once  . rivaroxaban  20 mg Oral Q supper  . spironolactone  50 mg Oral Daily  . tamsulosin  0.4 mg Oral Daily   Continuous Infusions:   LOS: 0 days    Time spent: 35 minutes. Greater than 50% of this time was spent in direct contact with the patient coordinating care.     Chaya Jan, MD [facility] Pager [phone]  If 7PM-7AM, please contact  night-coverage [email or web address] Password [facility] 05/12/2017, 4:48 PM

## 2017-05-13 DIAGNOSIS — R4182 Altered mental status, unspecified: Secondary | ICD-10-CM | POA: Diagnosis not present

## 2017-05-13 DIAGNOSIS — I5023 Acute on chronic systolic (congestive) heart failure: Secondary | ICD-10-CM | POA: Diagnosis not present

## 2017-05-13 DIAGNOSIS — I482 Chronic atrial fibrillation: Secondary | ICD-10-CM | POA: Diagnosis not present

## 2017-05-13 LAB — BASIC METABOLIC PANEL
Anion gap: 7 (ref 5–15)
BUN: 23 mg/dL — AB (ref 6–20)
CALCIUM: 8.8 mg/dL — AB (ref 8.9–10.3)
CO2: 29 mmol/L (ref 22–32)
CREATININE: 1.23 mg/dL (ref 0.61–1.24)
Chloride: 104 mmol/L (ref 101–111)
GFR calc Af Amer: >60 mL/min (ref >60)
GLUCOSE: 87 mg/dL (ref 65–99)
Potassium: 3.3 mmol/L — ABNORMAL LOW (ref 3.5–5.1)
Sodium: 140 mmol/L (ref 135–145)

## 2017-05-13 NOTE — Progress Notes (Signed)
Pt discharged home today per Dr. Hernandez. Pt's IV site D/C'd and WDL. Pt's VSS. Pt provided with home medication list, discharge instructions and prescriptions. Verbalized understanding. Pt left floor via WC in stable condition accompanied by NT. 

## 2017-05-13 NOTE — Discharge Summary (Signed)
Physician Discharge Summary  [name] ZOX:[id number] DOB: July 12, 1965 DOA: 05/11/2017  PCP: Pearson Grippe, MD  Admit date: 05/11/2017 Discharge date: 05/13/2017  Time spent: 45 minutes  Recommendations for Outpatient Follow-up:  -Will be discharged home today. -Advised to continue taking his Bumex and outpatient follow-up, however from experience he will not follow through.  Discharge Diagnoses:  Principal Problem:   Altered mental status Active Problems:   Hyperlipidemia   Essential hypertension   Asthma   GERD (gastroesophageal reflux disease)   Hypokalemia   Atrial fibrillation, chronic (HCC)   Normocytic anemia   Elevated troponin   Acute on chronic systolic (congestive) heart failure [facility])   Discharge Condition: Stable and improved  Filed Weights   05/11/17 0746 05/12/17 0551 05/13/17 0517  Weight: (!) 156.5 kg (345 lb 1.6 oz) (!) 152.4 kg (335 lb 14.4 oz) (!) 149 kg (328 lb 6.4 oz)    History of present illness:  As per Dr. Robb Matar on 11/20Loraine Leriche T Archeris a 51 y.o.male well-known to the hospitalist service, with medical history significant ofallergic rhinitis, asthma, mitral fibrillation, chronic systolic CHF, essential hypertension, GERD, PUD, history of MI with normal coronaries in December 2016, treatment noncompliance, OSA not on CPAP, osteoarthritis who was brought to the emergency department via EMS due to progressively worse dyspnea, dry cough, sore throat, wheezing, worsening lower extremity edema, PND and orthopnea for the past 2-3days.  He was recently admitted from 10/21to10/23/2018 to this facility due to acute on chronic systolic CHF. After discharge, he states that he was seen at [facility], given IV Lasix and discharged home 2 days later.  He was readmitted on 04/19/2017 until 04/23/2017.  Then he was readmitted on 05/09/2017, but signed AMA yesterday 05/10/2017 after getting upset at the nursing staff for not giving him water, despite  study was explained to him that he was on fluid restriction.  He otherwise states that he has been compliant with medications and diet.  EMS reported that the patient was initially altered and hypoxic, but that has resolved after the patient has been given treatment in the emergency department.  He is currently awake, alert and oriented x4.  He denies fever, headache, rhinorrhea, chest pain, palpitations, dizziness,  diaphoresis, abdominal pain, nausea, emesis, diarrhea, constipation, melena or hematochezia. He complains of urinating "a lot" after being given Lasix, but denies GU symptoms otherwise. Denies polyphagia, polydipsia, polyuria or blurred vision. The patient states he gets frequent pruritus of the lower extremities.  ED Course:Initial vital signs temperature 98.57F, pulse 93, respirations 20, blood pressure 138/100 mmHg and O2 sat 95% on room air. He was provided supplemental oxygen and furosemide 40 mg IVP 1 dose. I also ordered K Dur 40 mEq by mouth 1 dose and Xopenex 1.25+ Atrovent 0.5 mg  nebulizer 1.   Hisworkup shows negative urine toxicology, urinalysis, normal alcohol, acetaminophen and aspirin levels.  He has a mildly elevated troponin of 0.03 ng/mL and a BNP of 487 pg/mL. His EKG shows atrial fibrillation with RVR with a ventricular rate of 105 BPM and nonspecific T-wave abnormalities. Not significantly changed from previous. ABG showed a pH of 7.36, PCO2 of 48, all other values were unremarkable except for a PCO2 of 44.6 mmHg. His chest radiograph showed cardiomegaly and pulmonary vascular congestion. Please see imageand radiology report for further detail.     Hospital Course:   Acute encephalopathy -Etiology remains unclear but resolved quickly after admission, may be due to hypoxemia due to pulmonary edema.  Acute on chronic systolic heart failure -Echo with known ejection fraction of 55% technically inadequate to evaluate LV diastolic function. -He has  diuresed 8.1 L since admission, volume status is improved.  Patient anxious for discharge home today. -He has been instructed to continue Bumex that he is supposed to take as an outpatient, however he is well known to be noncompliant and I doubt he will follow through with our recommendations. -Continue Coreg, lisinopril.  Mildly elevated troponin -This is his baseline, likely due to acute CHF.  Denies chest pain. -No plans for further cardiac workup.  Chronic A. Fib -Rate controlled, anticoagulated on Xarelto.  Essential hypertension -Well-controlled-, continue Coreg and lisinopril.  Hyperlipidemia -Continue atorvastatin.    Procedures:  None   Consultations:  None  Discharge Instructions  Discharge Instructions    Diet - low sodium heart healthy   Complete by:  As directed    Increase activity slowly   Complete by:  As directed      Allergies as of 05/13/2017      Reactions   Banana Shortness Of Breath   Bee Venom Shortness Of Breath, Swelling, Other (See Comments)   Reaction:  Facial swelling   Food Shortness Of Breath, Rash, Other (See Comments)   Pt states that he is allergic to strawberries.     Aspirin Other (See Comments)   Reaction:  GI upset    Metolazone Other (See Comments)   Pt states that he stopped taking this med due to heart attack like symptoms.    Oatmeal Nausea And Vomiting   Orange Juice [orange Oil] Nausea And Vomiting   All acidic products make him nauseous and upset stomach   Torsemide Swelling, Other (See Comments)   Reaction:  Swelling of feet/legs    Diltiazem Palpitations   Hydralazine Palpitations   Lipitor [atorvastatin] Other (See Comments)   Reaction:  Nose bleeds       Medication List    TAKE these medications   albuterol (2.5 MG/3ML) 0.083% nebulizer solution Commonly known as:  PROVENTIL Take 2.5 mg by nebulization every 6 (six) hours as needed for wheezing or shortness of breath.   aspirin 81 MG chewable  tablet Chew 1 tablet (81 mg total) by mouth daily.   atorvastatin 40 MG tablet Commonly known as:  LIPITOR Take 1 tablet (40 mg total) by mouth daily at 6 PM.   beclomethasone 80 MCG/ACT inhaler Commonly known as:  QVAR Inhale 2 puffs into the lungs 2 (two) times daily.   bumetanide 2 MG tablet Commonly known as:  BUMEX Take 1 tablet (2 mg total) by mouth 2 (two) times daily.   carvedilol 25 MG tablet Commonly known as:  COREG Take 1 tablet (25 mg total) by mouth 2 (two) times daily with a meal.   docusate sodium 100 MG capsule Commonly known as:  COLACE Take 1 capsule (100 mg total) by mouth 2 (two) times daily. What changed:    when to take this  reasons to take this   ferrous gluconate 324 MG tablet Commonly known as:  FERGON Take 1 tablet (324 mg total) by mouth 2 (two) times daily with a meal. What changed:  when to take this   lisinopril 5 MG tablet Commonly known as:  PRINIVIL,ZESTRIL Take 1 tablet (5 mg total) by mouth daily.   OXYGEN Inhale 3.5 L into the lungs daily.   pantoprazole 40 MG tablet Commonly known as:  PROTONIX Take 1 tablet (40 mg total) by mouth daily  at 6 (six) AM. What changed:  when to take this   potassium chloride SA 20 MEQ tablet Commonly known as:  K-DUR,KLOR-CON Take 1 tablet (20 mEq total) by mouth daily.   rivaroxaban 20 MG Tabs tablet Commonly known as:  XARELTO Take 1 tablet (20 mg total) by mouth daily with breakfast. What changed:  when to take this   spironolactone 50 MG tablet Commonly known as:  ALDACTONE Take 50 mg daily by mouth. For fluid   tamsulosin 0.4 MG Caps capsule Commonly known as:  FLOMAX Take 1 capsule (0.4 mg total) by mouth daily.      Allergies  Allergen Reactions  . Banana Shortness Of Breath  . Bee Venom Shortness Of Breath, Swelling and Other (See Comments)    Reaction:  Facial swelling  . Food Shortness Of Breath, Rash and Other (See Comments)    Pt states that he is allergic to  strawberries.    . Aspirin Other (See Comments)    Reaction:  GI upset   . Metolazone Other (See Comments)    Pt states that he stopped taking this med due to heart attack like symptoms.   . Oatmeal Nausea And Vomiting  . Orange Juice [Orange Oil] Nausea And Vomiting    All acidic products make him nauseous and upset stomach  . Torsemide Swelling and Other (See Comments)    Reaction:  Swelling of feet/legs   . Diltiazem Palpitations  . Hydralazine Palpitations  . Lipitor [Atorvastatin] Other (See Comments)    Reaction:  Nose bleeds    Follow-up Information    Kim, [name], MD Follow up in 2 week(s).   Specialty:  Internal Medicine Why:  Please caPearson Grippell on Monday to set up appointment. Contact information: [address] [place] [address] [phone]            The results of significant diagnostics from this hospitalization (including imaging, microbiology, ancillary and laboratory) are listed below for reference.    Significant Diagnostic Studies: Dg Chest 2 View  Result Date: 05/11/2017 CLINICAL DATA:  51 year old male with shortness of breath. EXAM: CHEST  2 VIEW COMPARISON:  Chest radiograph dated 05/09/2017 FINDINGS: There is stable cardiomegaly with mild vascular congestion. No focal consolidation, pleural effusion, or pneumothorax. No acute osseous pathology. IMPRESSION: Cardiomegaly with mild vascular congestion similar to prior radiograph. Clinical correlation and follow-up recommended. Electronically Signed   By: Elgie CollardArash  Radparvar M.D.   On: 05/11/2017 03:50   Dg Chest 2 View  Result Date: 05/09/2017 CLINICAL DATA:  51 year old male with chest pain and shortness of breath. EXAM: CHEST  2 VIEW COMPARISON:  Chest radiograph dated 04/21/2017 and 04/19/2017 FINDINGS: There is moderate cardiomegaly with pulmonary vascular congestion. No focal consolidation, pleural effusion, or pneumothorax. No acute osseous pathology. IMPRESSION: No significant interval  change in the moderate cardiomegaly and vascular congestion since the prior radiographs. Electronically Signed   By: Elgie CollardArash  Radparvar M.D.   On: 05/09/2017 03:19   Ct Head Wo Contrast  Result Date: 05/11/2017 CLINICAL DATA:  Generalize weakness. EXAM: CT HEAD WITHOUT CONTRAST TECHNIQUE: Contiguous axial images were obtained from the base of the skull through the vertex without intravenous contrast. COMPARISON:  01/17/2016 FINDINGS: Brain: No evidence of acute infarction, hemorrhage, hydrocephalus, extra-axial collection or mass lesion/mass effect. Vascular: No hyperdense vessel or unexpected calcification. Skull: Normal. Negative for fracture or focal lesion. Sinuses/Orbits: Retention cysts in the left maxillary antrum. Paranasal sinuses and mastoid air cells are otherwise clear. Other: None. IMPRESSION:  No acute intracranial abnormalities. Electronically Signed   By: Burman Nieves M.D.   On: 05/11/2017 03:48   Dg Chest Port 1 View  Result Date: 04/21/2017 CLINICAL DATA:  Shortness of Breath EXAM: PORTABLE CHEST 1 VIEW COMPARISON:  April 19, 2017 FINDINGS: There is stable cardiomegaly with pulmonary venous hypertension. There is no frank edema or consolidation. No adenopathy appreciable. No bone lesions. No appreciable pleural effusion. IMPRESSION: Persistent pulmonary vascular congestion without edema or consolidation evident. Electronically Signed   By: Bretta Bang III M.D.   On: 04/21/2017 07:38   Dg Chest Port 1 View  Result Date: 04/19/2017 CLINICAL DATA:  Shortness of breath, swelling EXAM: PORTABLE CHEST 1 VIEW COMPARISON:  04/16/2017 FINDINGS: Cardiomegaly with pulmonary vascular congestion. Very mild interstitial edema is possible. No definite pleural effusions. No pneumothorax. IMPRESSION: Cardiomegaly with pulmonary vascular congestion and possible mild interstitial edema. Electronically Signed   By: Charline Bills M.D.   On: 04/19/2017 20:26    Microbiology: No results  found for this or any previous visit (from the past 240 hour(s)).   Labs: Basic Metabolic Panel: Recent Labs  Lab 05/09/17 0328 05/11/17 0308 05/12/17 0358 05/13/17 0455  NA 142 138 139 140  K 3.3* 3.3* 3.4* 3.3*  CL 108 103 103 104  CO2 29 27 29 29   GLUCOSE 104* 97 90 87  BUN 17 20 20  23*  CREATININE 1.12 1.27* 1.23 1.23  CALCIUM 9.1 8.5* 8.7* 8.8*  MG  --  1.9  --   --    Liver Function Tests: No results for input(s): AST, ALT, ALKPHOS, BILITOT, PROT, ALBUMIN in the last 168 hours. No results for input(s): LIPASE, AMYLASE in the last 168 hours. No results for input(s): AMMONIA in the last 168 hours. CBC: Recent Labs  Lab 05/09/17 0328 05/11/17 0308  WBC 3.6* 4.5  NEUTROABS  --  3.0  HGB 11.1* 10.7*  HCT 36.4* 35.5*  MCV 84.5 86.0  PLT 193 182   Cardiac Enzymes: Recent Labs  Lab 05/09/17 1528 05/11/17 0308 05/11/17 0859 05/11/17 1547 05/11/17 2106  TROPONINI 0.04* 0.03* 0.03* <0.03 <0.03   BNP: BNP (last 3 results) Recent Labs    04/19/17 2009 05/09/17 0329 05/11/17 0308  BNP 509.0* 557.0* 487.0*    ProBNP (last 3 results) No results for input(s): PROBNP in the last 8760 hours.  CBG: No results for input(s): GLUCAP in the last 168 hours.     Signed:  Chaya Jan  [facility] Pager: [phone] 05/13/2017, 5:31 PM

## 2017-05-19 NOTE — Discharge Summary (Signed)
Physician Discharge Summary  [name] HQP:[id number] DOB: [birth year] DOA: 05/09/2017  PCP: Pearson Grippe, MD  Admit date: 05/09/2017 Discharge date: 05/10/2017  PATIENT LEFT THE HOSPITAL AGAINST MEDICAL ADVICE  Brief/Interim Summary: 51 year old male, well-known to our service for recurrent admissions for decompensated CHF was admitted to the hospital for CHF exacerbation.  Started on IV Lasix.  On 11/29 at 00:02, patient decided to leave the hospital AGAINST MEDICAL ADVICE.  He was upset that staff would not let him keep a 2 L bottle of water in his room.  It was explained that he was on a 1500 mL fluid restriction due to volume overload.  Patient decided to leave the hospital AGAINST MEDICAL ADVICE anyways.  It is noted that within a few hours he came back to the hospital for shortness of breath.  Discharge Diagnoses:  Principal Problem:   CHF exacerbation (HCC) Active Problems:   Hyperlipidemia   OBESITY, MORBID   Essential hypertension   Asthma   Hypokalemia   Atrial fibrillation, chronic (HCC)   Chronic respiratory failure with hypoxia (HCC)      The results of significant diagnostics from this hospitalization (including imaging, microbiology, ancillary and laboratory) are listed below for reference.     Microbiology: No results found for this or any previous visit (from the past 240 hour(s)).   Labs: BNP (last 3 results) Recent Labs    04/19/17 2009 05/09/17 0329 05/11/17 0308  BNP 509.0* 557.0* 487.0*   Basic Metabolic Panel: Recent Labs  Lab 05/13/17 0455  NA 140  K 3.3*  CL 104  CO2 29  GLUCOSE 87  BUN 23*  CREATININE 1.23  CALCIUM 8.8*   Liver Function Tests: No results for input(s): AST, ALT, ALKPHOS, BILITOT, PROT, ALBUMIN in the last 168 hours. No results for input(s): LIPASE, AMYLASE in the last 168 hours. No results for input(s): AMMONIA in the last 168 hours. CBC: No results for input(s): WBC, NEUTROABS, HGB, HCT, MCV, PLT in the last 168  hours. Cardiac Enzymes: No results for input(s): CKTOTAL, CKMB, CKMBINDEX, TROPONINI in the last 168 hours. BNP: Invalid input(s): POCBNP CBG: No results for input(s): GLUCAP in the last 168 hours. D-Dimer No results for input(s): DDIMER in the last 72 hours. Hgb A1c No results for input(s): HGBA1C in the last 72 hours. Lipid Profile No results for input(s): CHOL, HDL, LDLCALC, TRIG, CHOLHDL, LDLDIRECT in the last 72 hours. Thyroid function studies No results for input(s): TSH, T4TOTAL, T3FREE, THYROIDAB in the last 72 hours.  Invalid input(s): FREET3 Anemia work up No results for input(s): VITAMINB12, FOLATE, FERRITIN, TIBC, IRON, RETICCTPCT in the last 72 hours. Urinalysis    Component Value Date/Time   COLORURINE STRAW (A) 05/11/2017 0302   APPEARANCEUR CLEAR 05/11/2017 0302   LABSPEC 1.006 05/11/2017 0302   PHURINE 6.0 05/11/2017 0302   GLUCOSEU NEGATIVE 05/11/2017 0302   GLUCOSEU NEG mg/dL 59/93/5701 7793   HGBUR NEGATIVE 05/11/2017 0302   BILIRUBINUR NEGATIVE 05/11/2017 0302   KETONESUR NEGATIVE 05/11/2017 0302   PROTEINUR NEGATIVE 05/11/2017 0302   UROBILINOGEN 0.2 08/08/2014 1445   NITRITE NEGATIVE 05/11/2017 0302   LEUKOCYTESUR NEGATIVE 05/11/2017 0302   Sepsis Labs Invalid input(s): PROCALCITONIN,  WBC,  LACTICIDVEN Microbiology No results found for this or any previous visit (from the past 240 hour(s)).   Time coordinating discharge: Over 30 minutes  SIGNED:   Erick Blinks, MD  [facility] 05/19/2017, 8:35 PM Pager   If 7PM-7AM, please contact night-coverage [email or web address] Password [email or web address]

## 2017-05-21 DIAGNOSIS — D649 Anemia, unspecified: Secondary | ICD-10-CM | POA: Diagnosis not present

## 2017-05-21 DIAGNOSIS — I272 Pulmonary hypertension, unspecified: Secondary | ICD-10-CM | POA: Diagnosis not present

## 2017-05-21 DIAGNOSIS — E785 Hyperlipidemia, unspecified: Secondary | ICD-10-CM | POA: Diagnosis not present

## 2017-05-21 DIAGNOSIS — I5022 Chronic systolic (congestive) heart failure: Secondary | ICD-10-CM | POA: Diagnosis not present

## 2017-05-21 DIAGNOSIS — Z09 Encounter for follow-up examination after completed treatment for conditions other than malignant neoplasm: Secondary | ICD-10-CM | POA: Diagnosis not present

## 2017-05-22 IMAGING — DX DG CHEST 2V
2 series · 2 of 2 positions shown · non-contrast
Comparison: 09/10/2015

CLINICAL DATA: Elevated temperature, swelling in the legs

EXAM:
CHEST  2 VIEW

[chest pa]
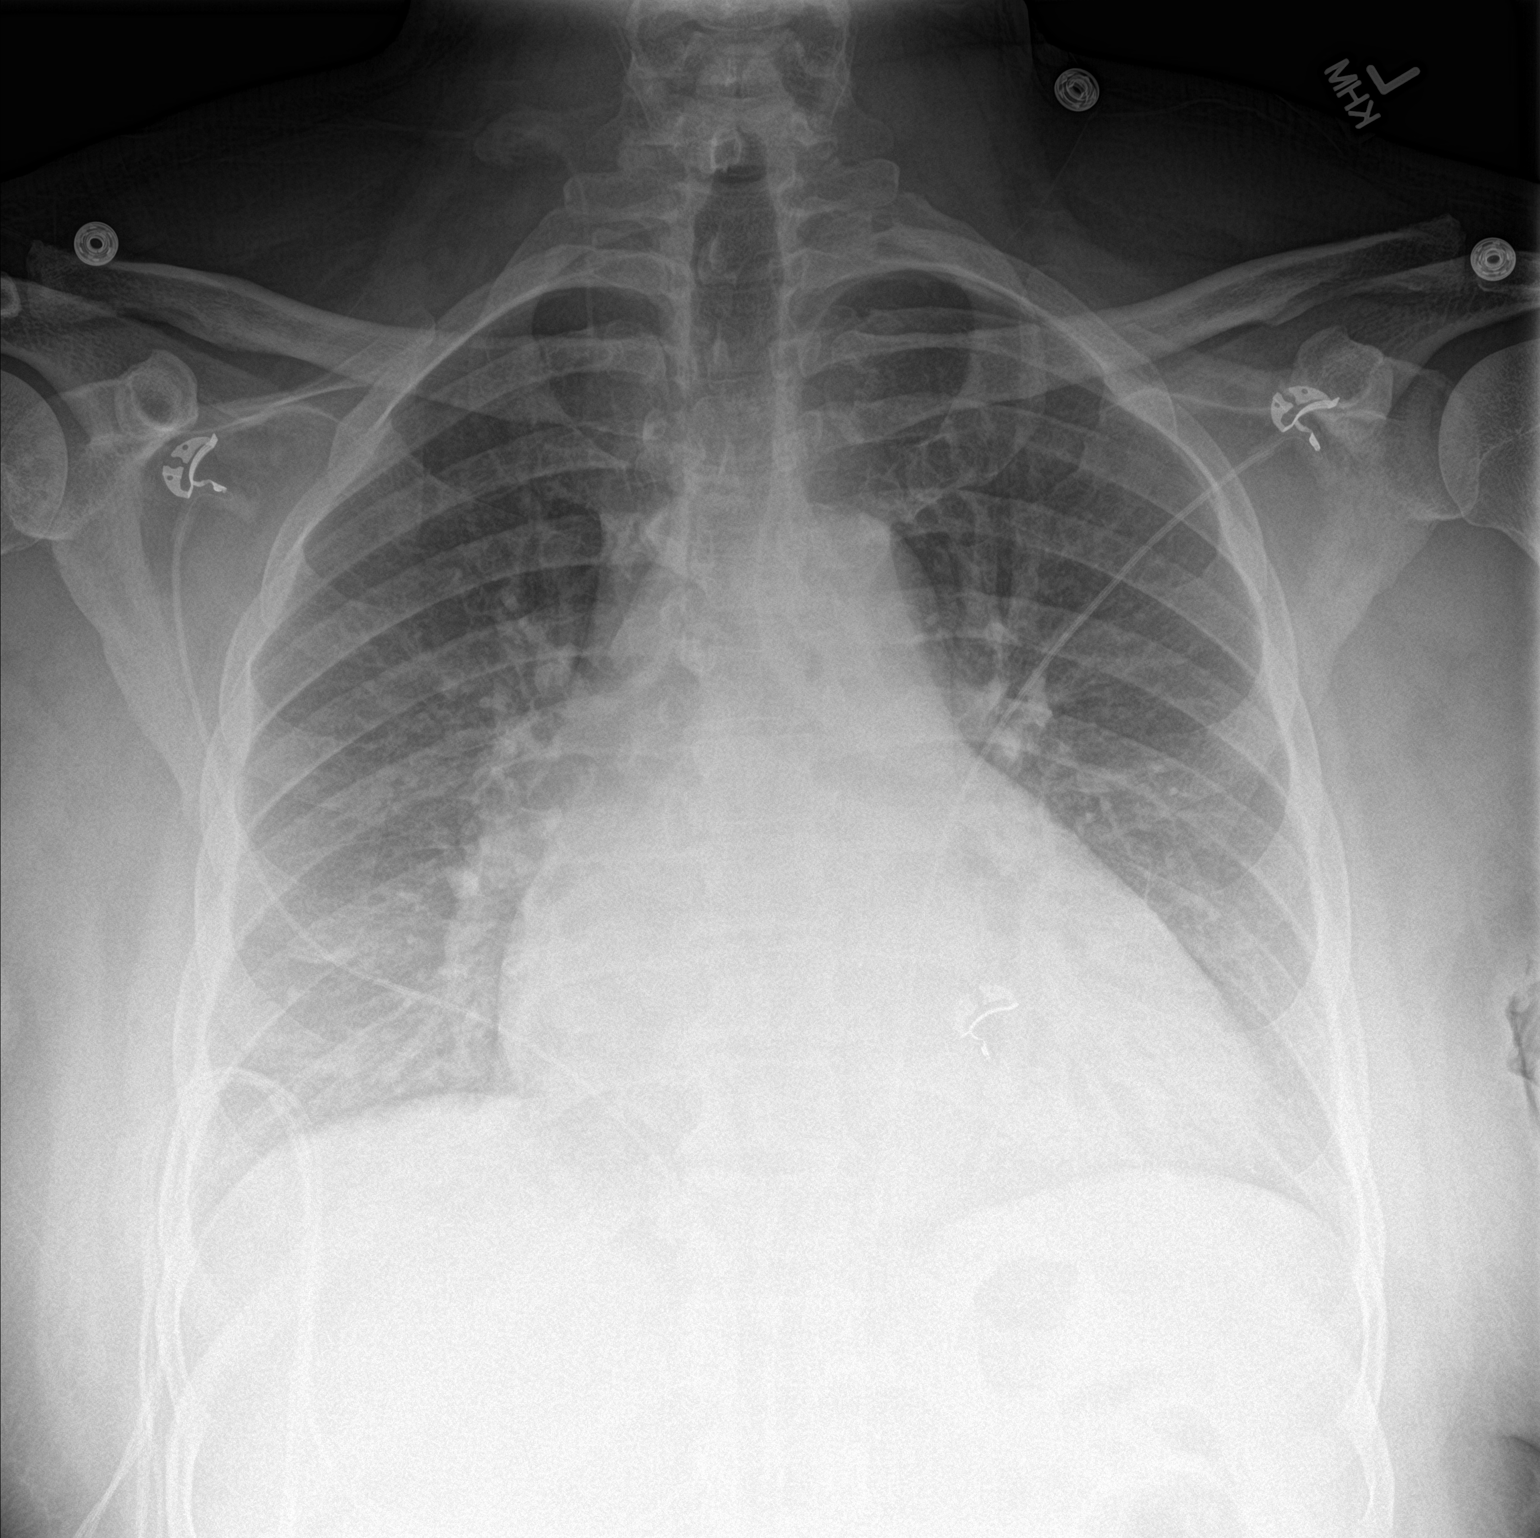

[chest lat]
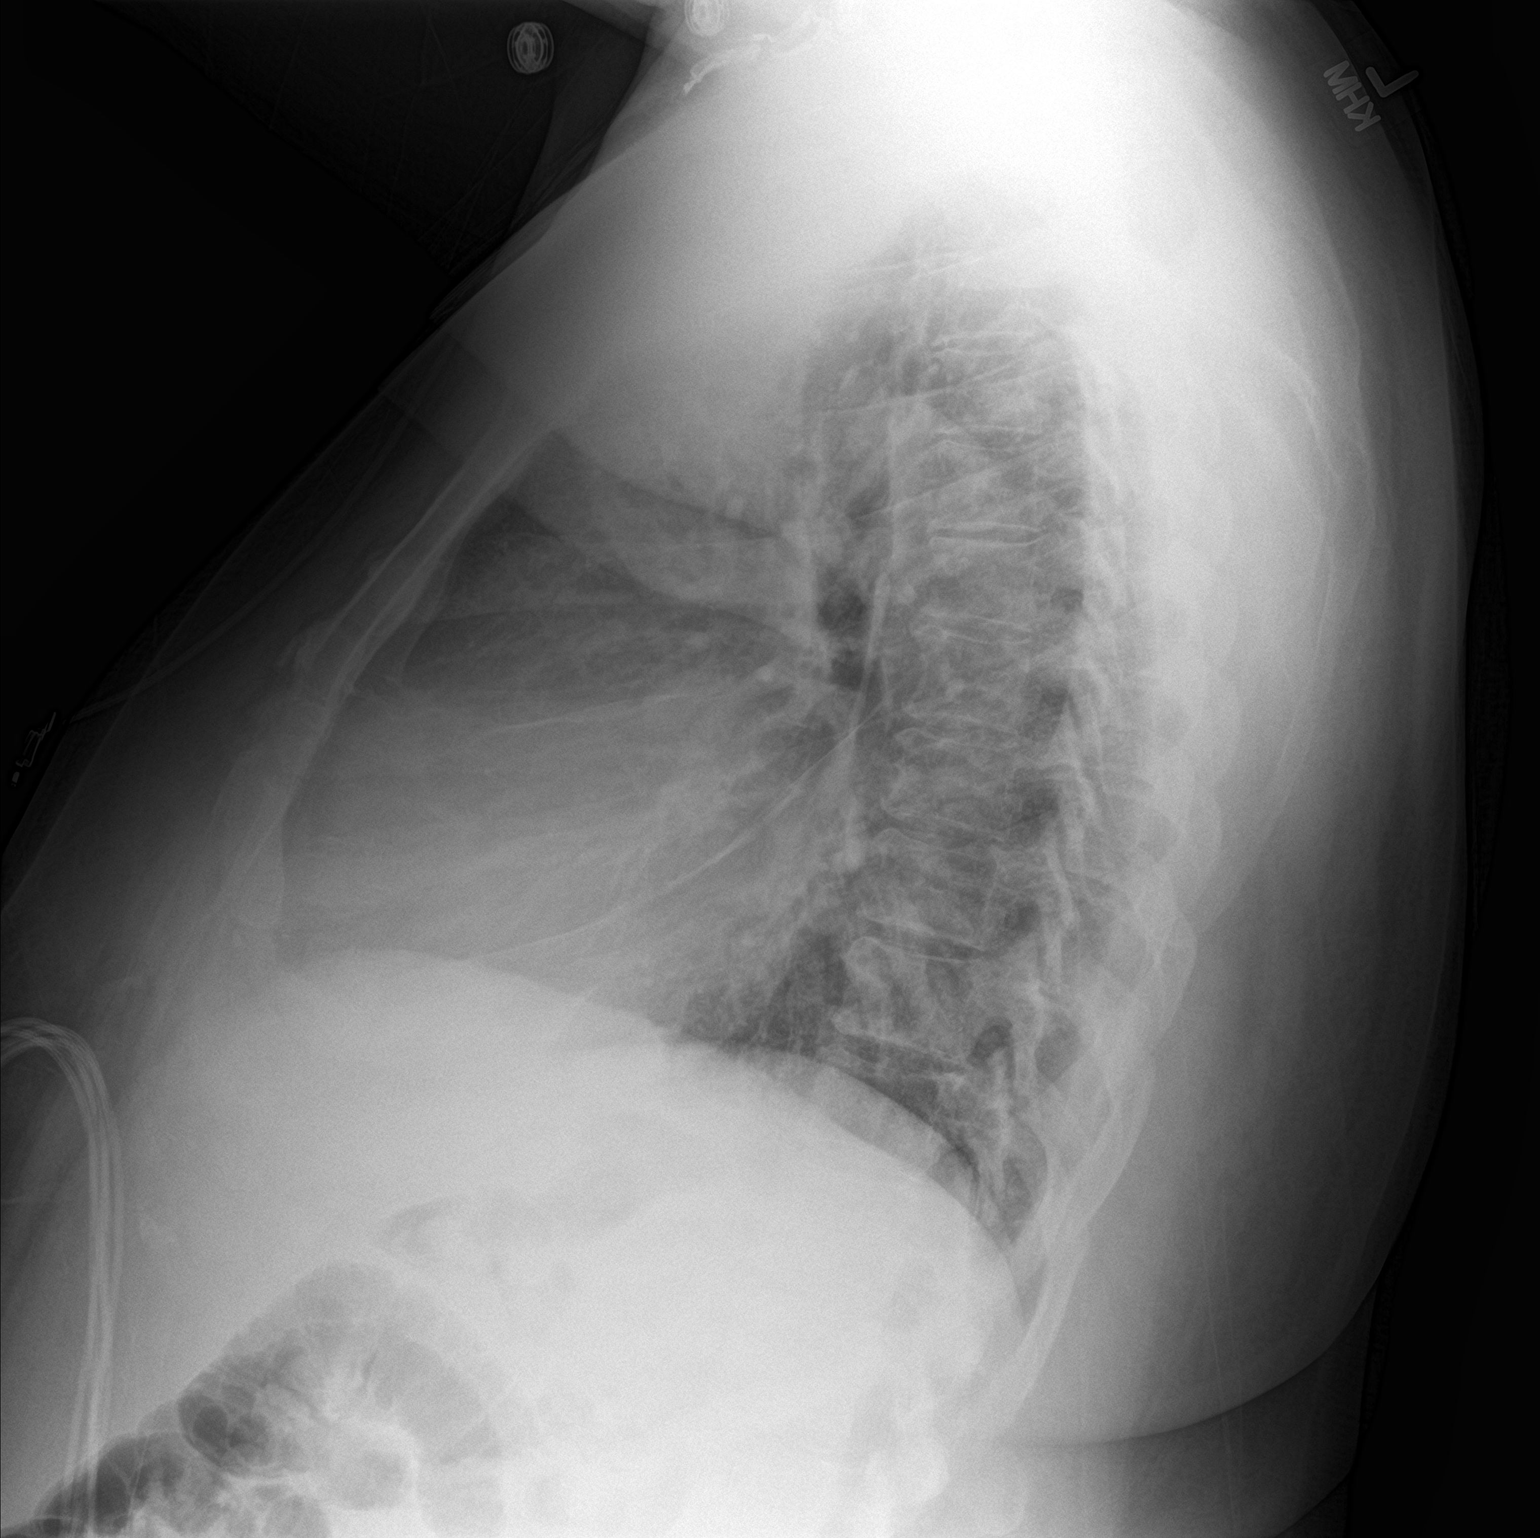

[2 of 2 positions shown; findings below may reference images not displayed]

FINDINGS: Cardiomegaly. Mild interstitial prominence bilateral lower lobe
suspicious for residual mild interstitial edema with improvement
from prior exam. No segmental infiltrate. No pleural effusion.
IMPRESSION: Cardiomegaly. Mild interstitial prominence bilateral lower lobe
suspicious for residual mild interstitial edema with improvement
from prior exam. No segmental infiltrate. No pleural effusion.

## 2017-05-25 IMAGING — DX DG CHEST 2V
2 series · 2 of 2 positions shown · non-contrast
Comparison: September 15, 2015

CLINICAL DATA: Shortness of Breath

EXAM:
CHEST  2 VIEW

[chest pa]
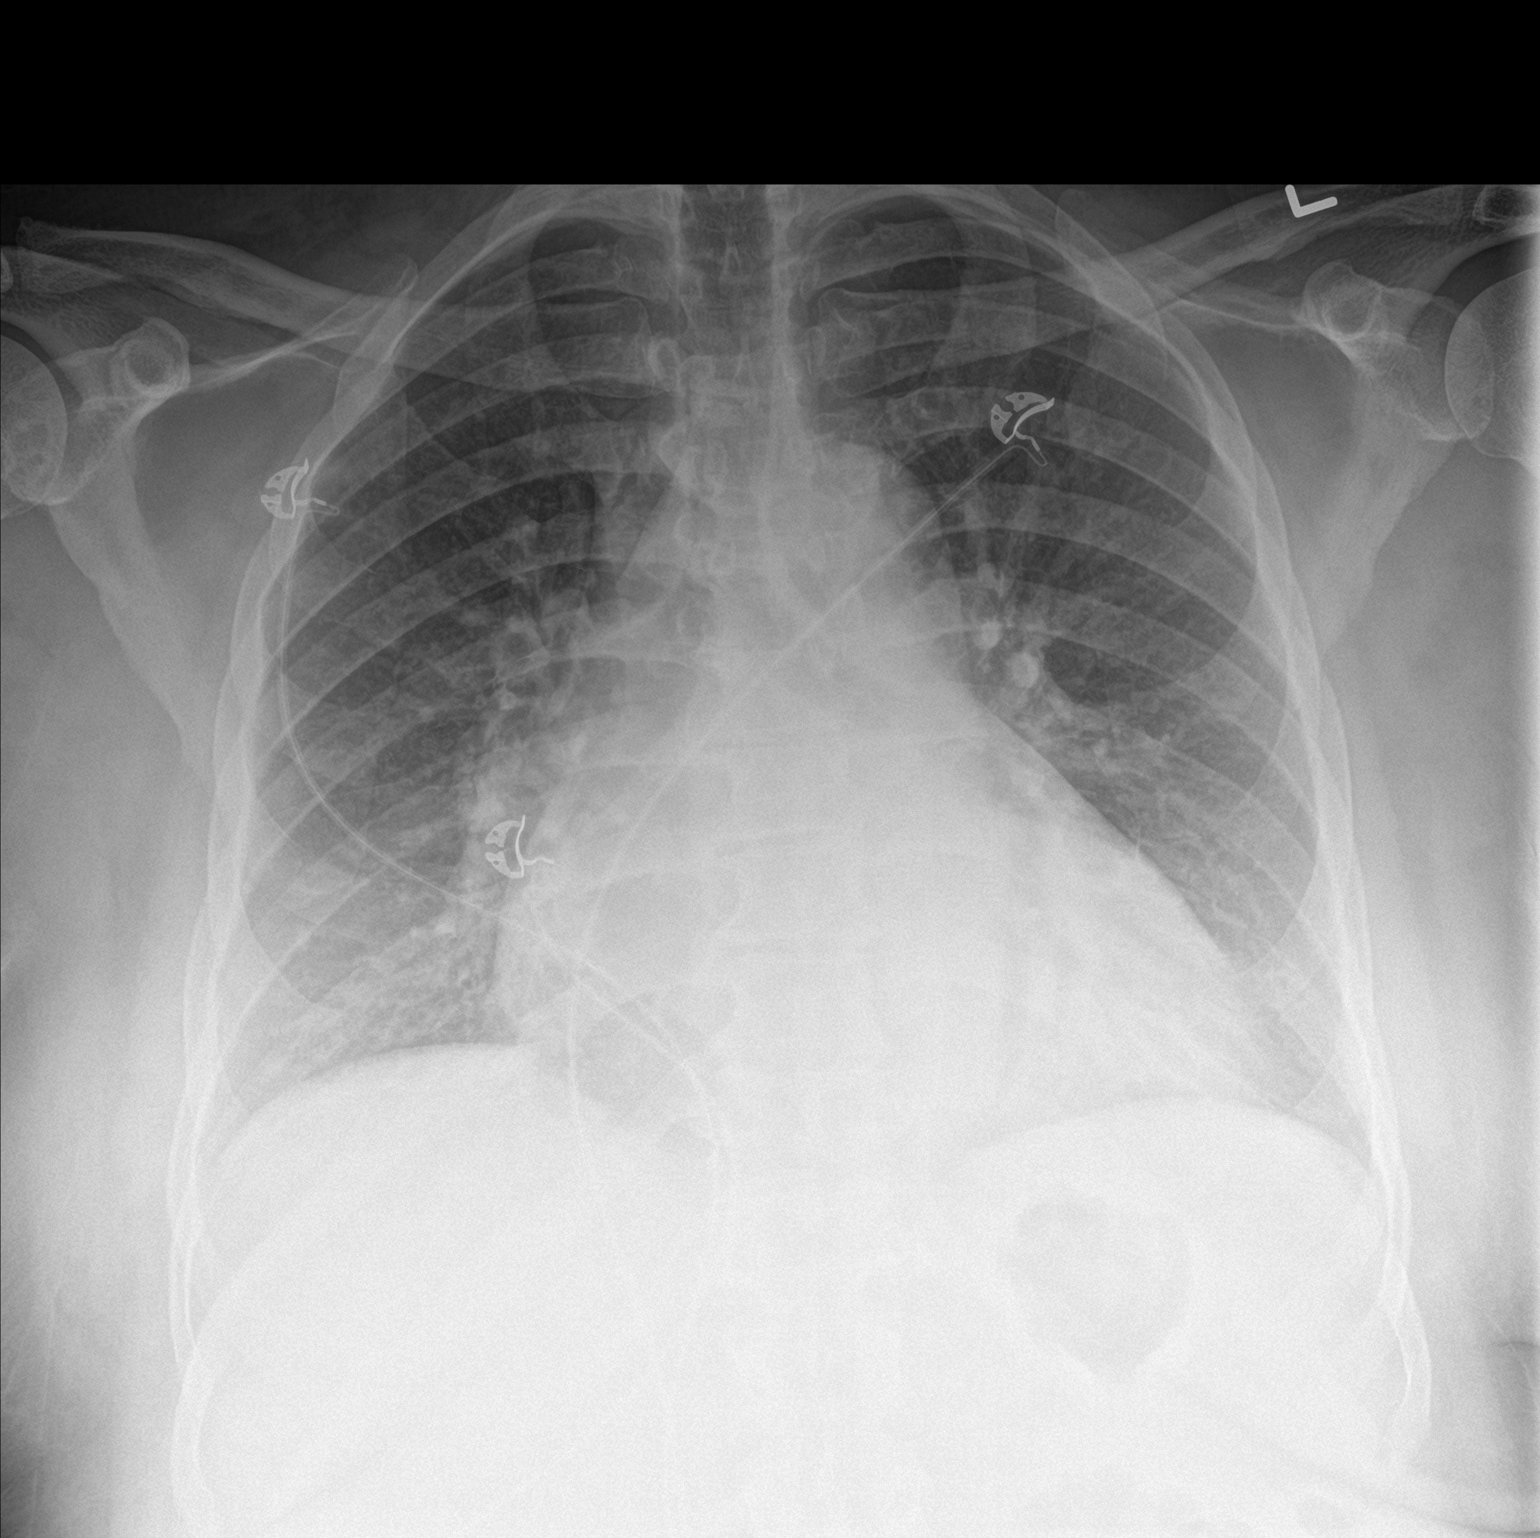

[chest lat]
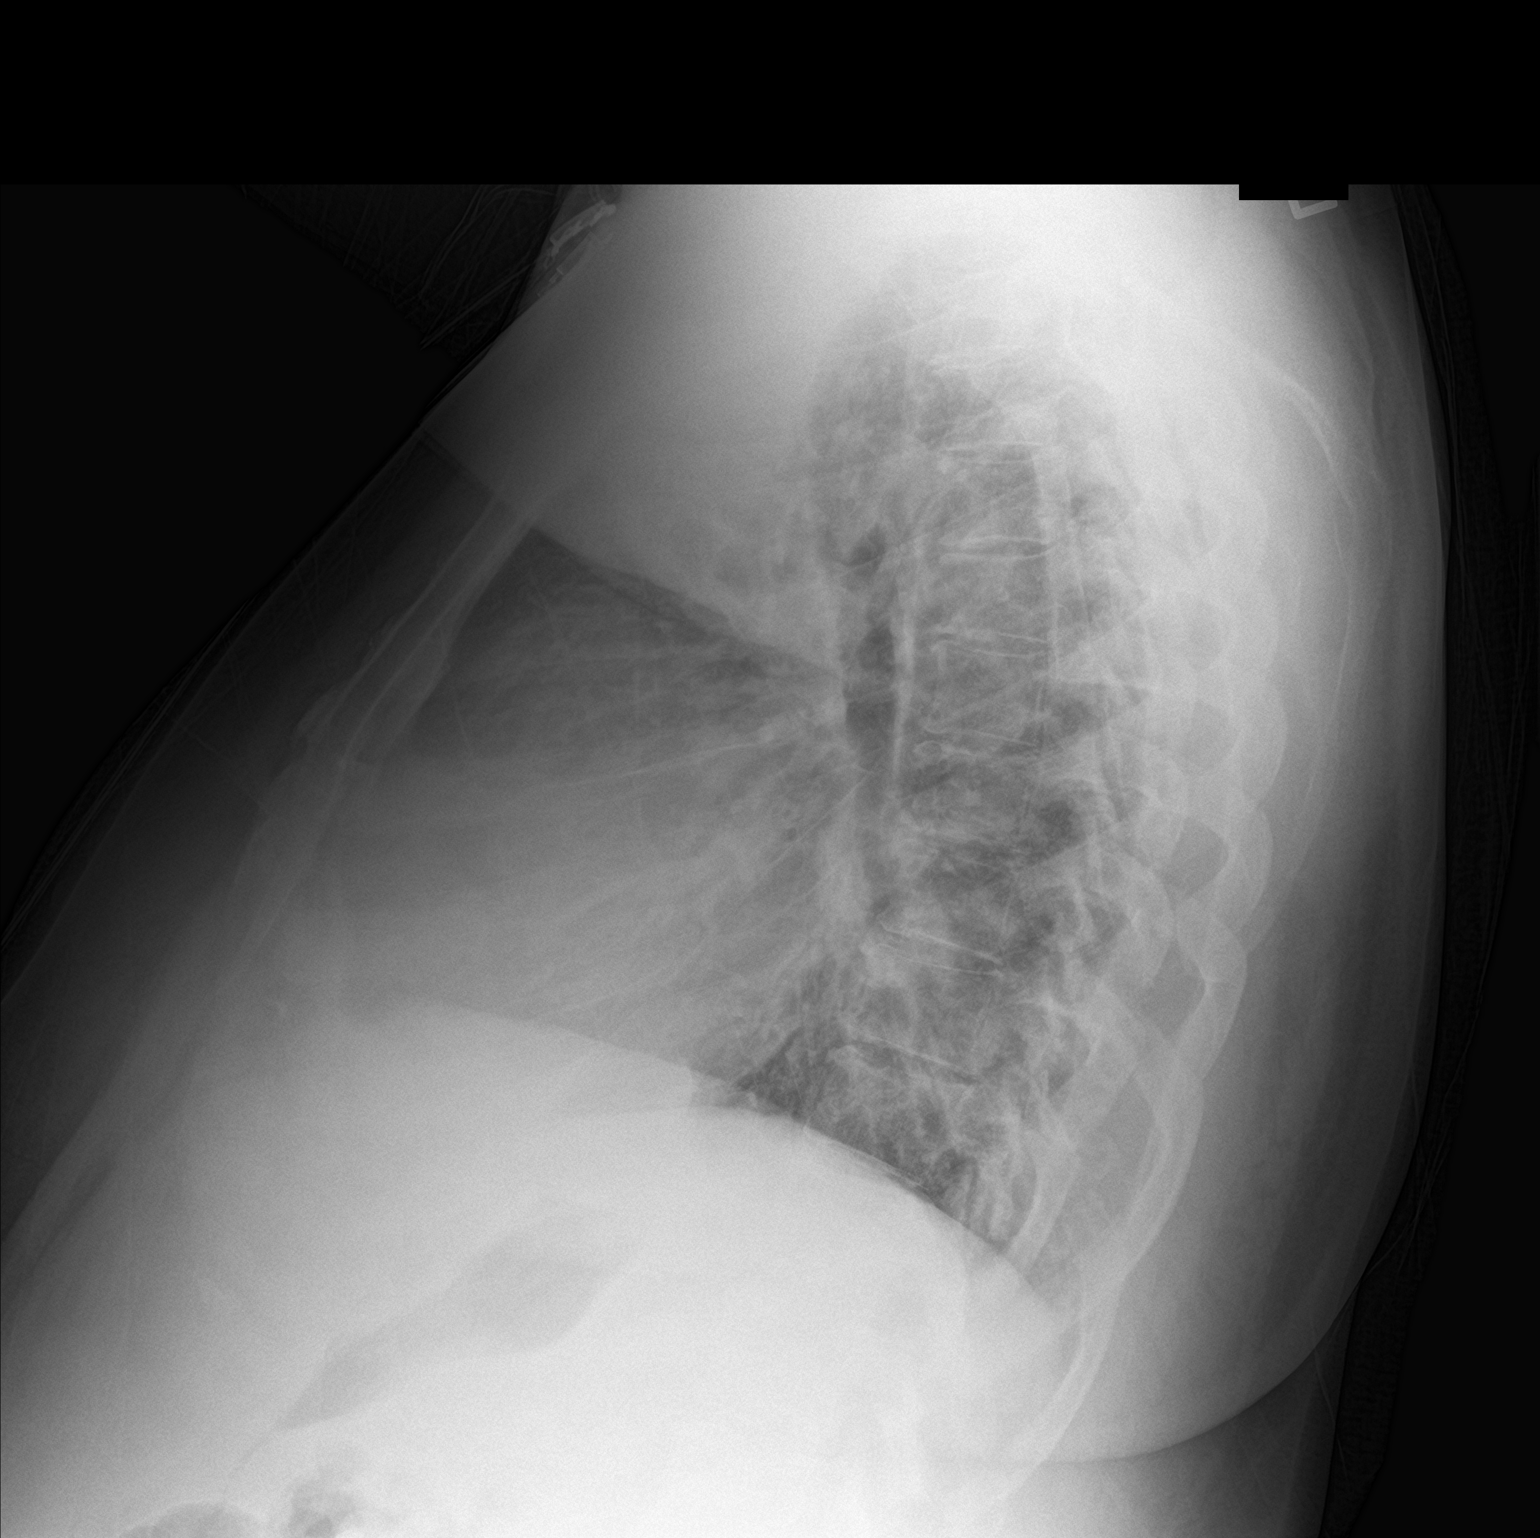

[2 of 2 positions shown; findings below may reference images not displayed]

FINDINGS: There is no edema or consolidation. Heart is enlarged with pulmonary
vascularity within normal limits, stable. No adenopathy. No bone
lesions.
IMPRESSION: Stable cardiac enlargement.  No edema or consolidation.

## 2017-05-26 IMAGING — CT CT ANGIO CHEST
1 of 6 series · 5 of 36 positions shown · IV contrast (Omnipaque 300)
Comparison: PA lateral chest 09/18/2015.

CLINICAL DATA: Shortness of breath. Lower leg swelling and light
headed for 4 days. Initial encounter.

EXAM:
CT ANGIOGRAPHY CHEST WITH CONTRAST
TECHNIQUE: Multidetector CT imaging of the chest was performed using the
standard protocol during bolus administration of intravenous
contrast. Multiplanar CT image reconstructions and MIPs were
obtained to evaluate the vascular anatomy.
CONTRAST:  120 ml Isovue 370.

[Series 5: pe 3.0 b40f · axial · 0.79mm/px · z∈[+14,+206]mm · 5 of 98 slices shown]
[im 17/98  lung]
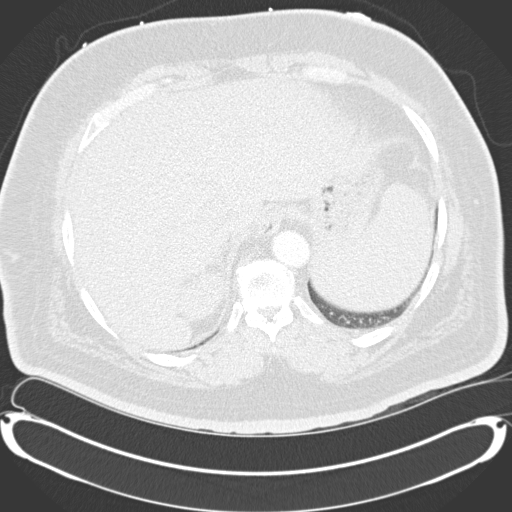
[im 33/98  mediastinal]
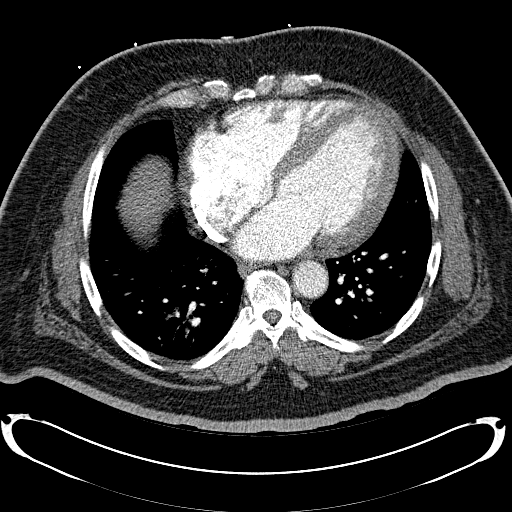
[im 49/98  lung]
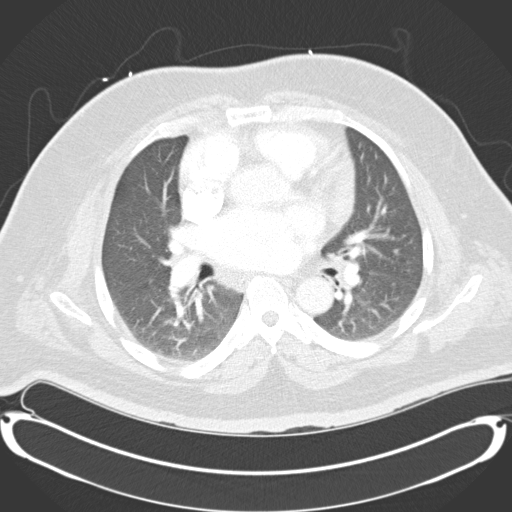
[im 65/98  mediastinal]
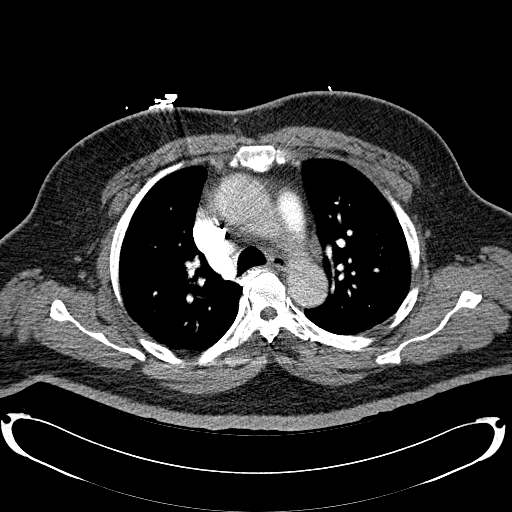
[im 81/98  lung]
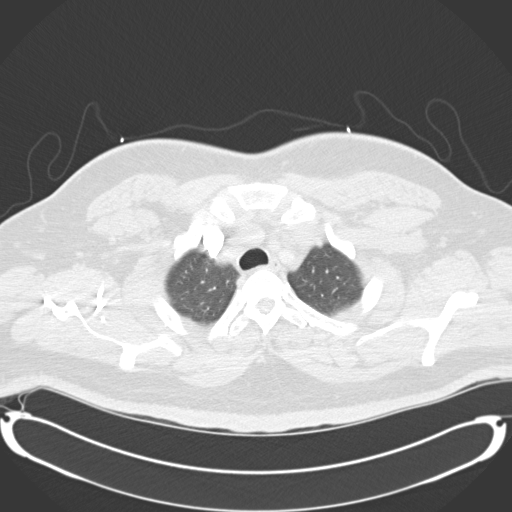

[5 of 36 positions shown; findings below may reference images not displayed]

FINDINGS: No pulmonary embolus is identified. There is marked cardiomegaly. No
pleural or pericardial effusion. No axillary, hilar or mediastinal
lymphadenopathy. The lungs demonstrate minimal dependent
atelectasis. 2-3 small calcified granulomata are seen in the right
lower lobe and right middle lobe.

There is partial visualization of a cyst in the upper pole the right
kidney. Imaged upper abdomen is otherwise unremarkable. Schmorl's
node is noted the superior endplate of a lower thoracic vertebral
body. No worrisome bony lesion is seen.

Review of the MIP images confirms the above findings.
IMPRESSION: Negative for pulmonary embolus or acute disease.

Marked cardiomegaly.

## 2017-05-28 DIAGNOSIS — I10 Essential (primary) hypertension: Secondary | ICD-10-CM | POA: Diagnosis not present

## 2017-06-02 IMAGING — CR DG CHEST 1V PORT
1 series · 1 of 1 positions shown · non-contrast
Comparison: Radiographs and CT 1 week prior.

CLINICAL DATA: Shortness of breath this morning.

EXAM:
PORTABLE CHEST 1 VIEW

[ap]
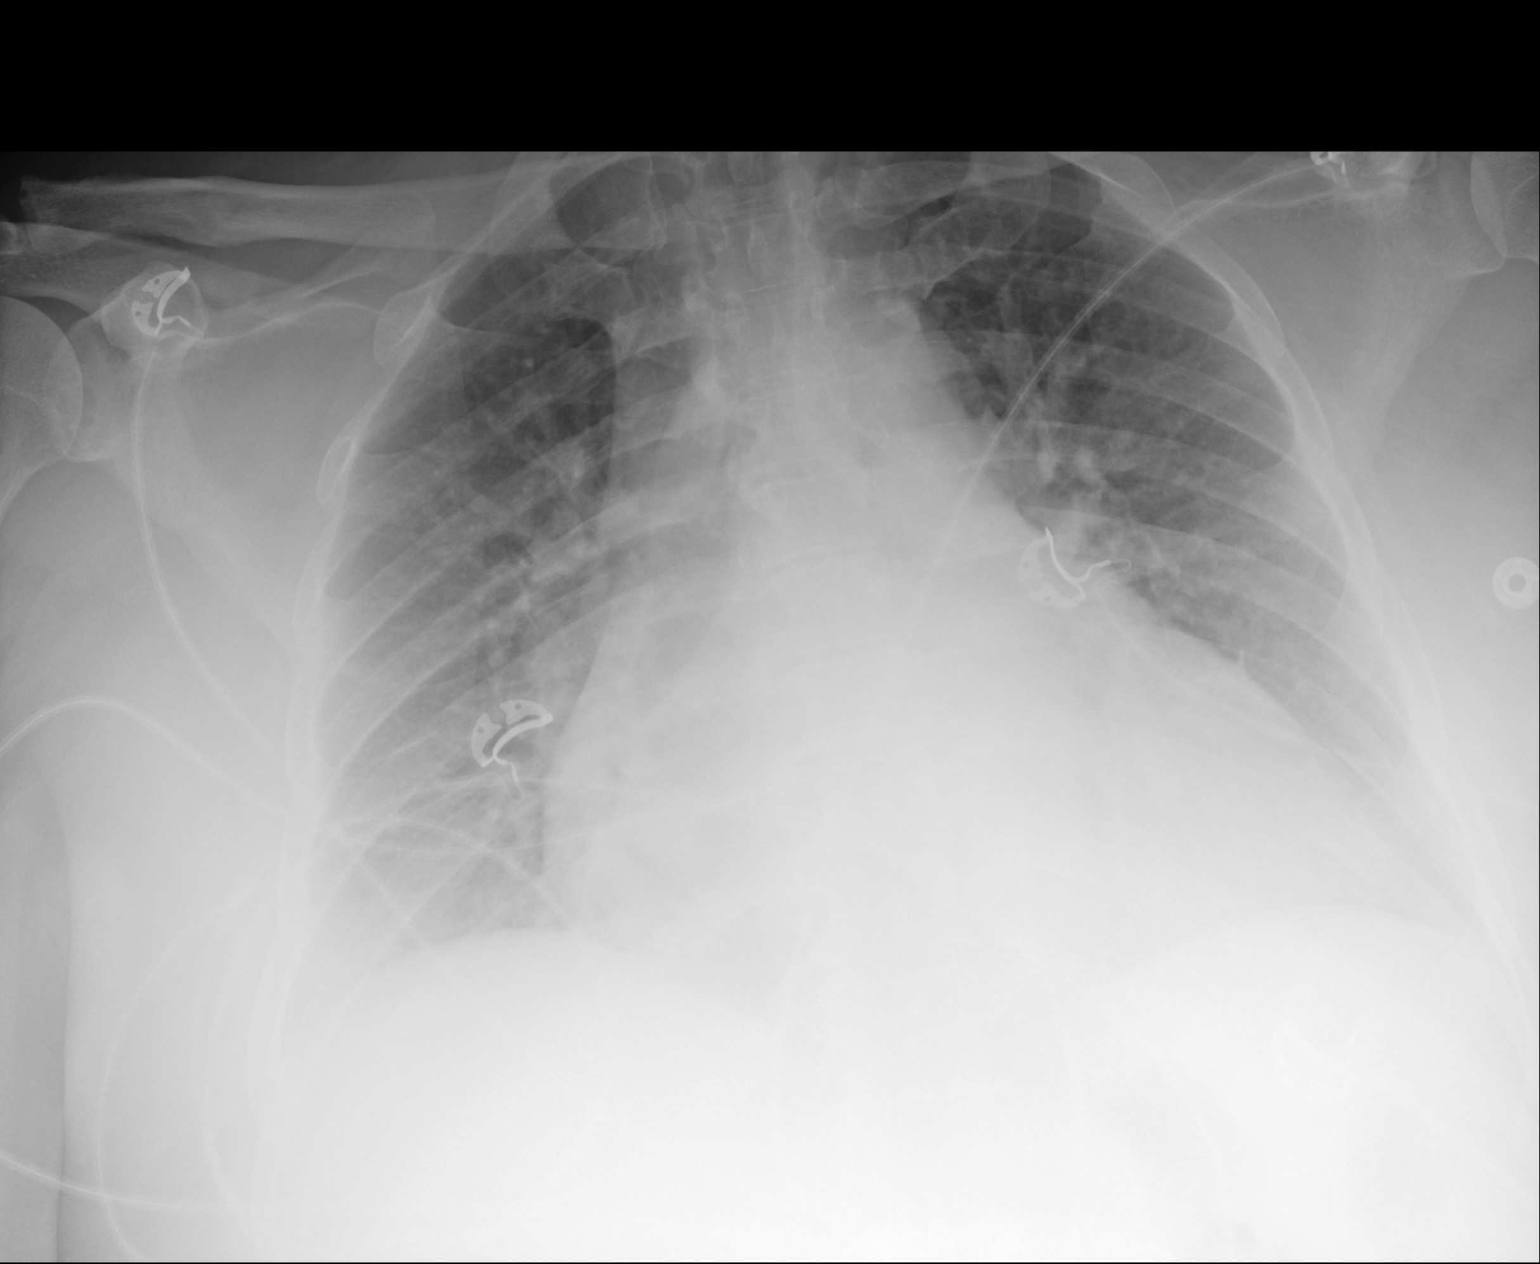

[1 of 1 positions shown; findings below may reference images not displayed]

FINDINGS: Marked cardiomegaly again seen, stable. Questionable vascular
congestion versus differences in technique. No confluent airspace
disease, pleural effusion or pneumothorax.
IMPRESSION: Chronic cardiomegaly.  Questionable vascular congestion.

## 2017-06-04 IMAGING — CR DG CHEST 2V
2 series · 2 of 2 positions shown · non-contrast
Comparison: 09/26/2015 and earlier, including CTA chest 09/19/2015.

CLINICAL DATA: 49-year-old with current history of CHF, presenting
with recurrent shortness of breath, chest pain, and bilateral lower
extremity edema. Recent multiple hospitalizations for control of
fluid status.

EXAM:
CHEST  2 VIEW

[chest ap]
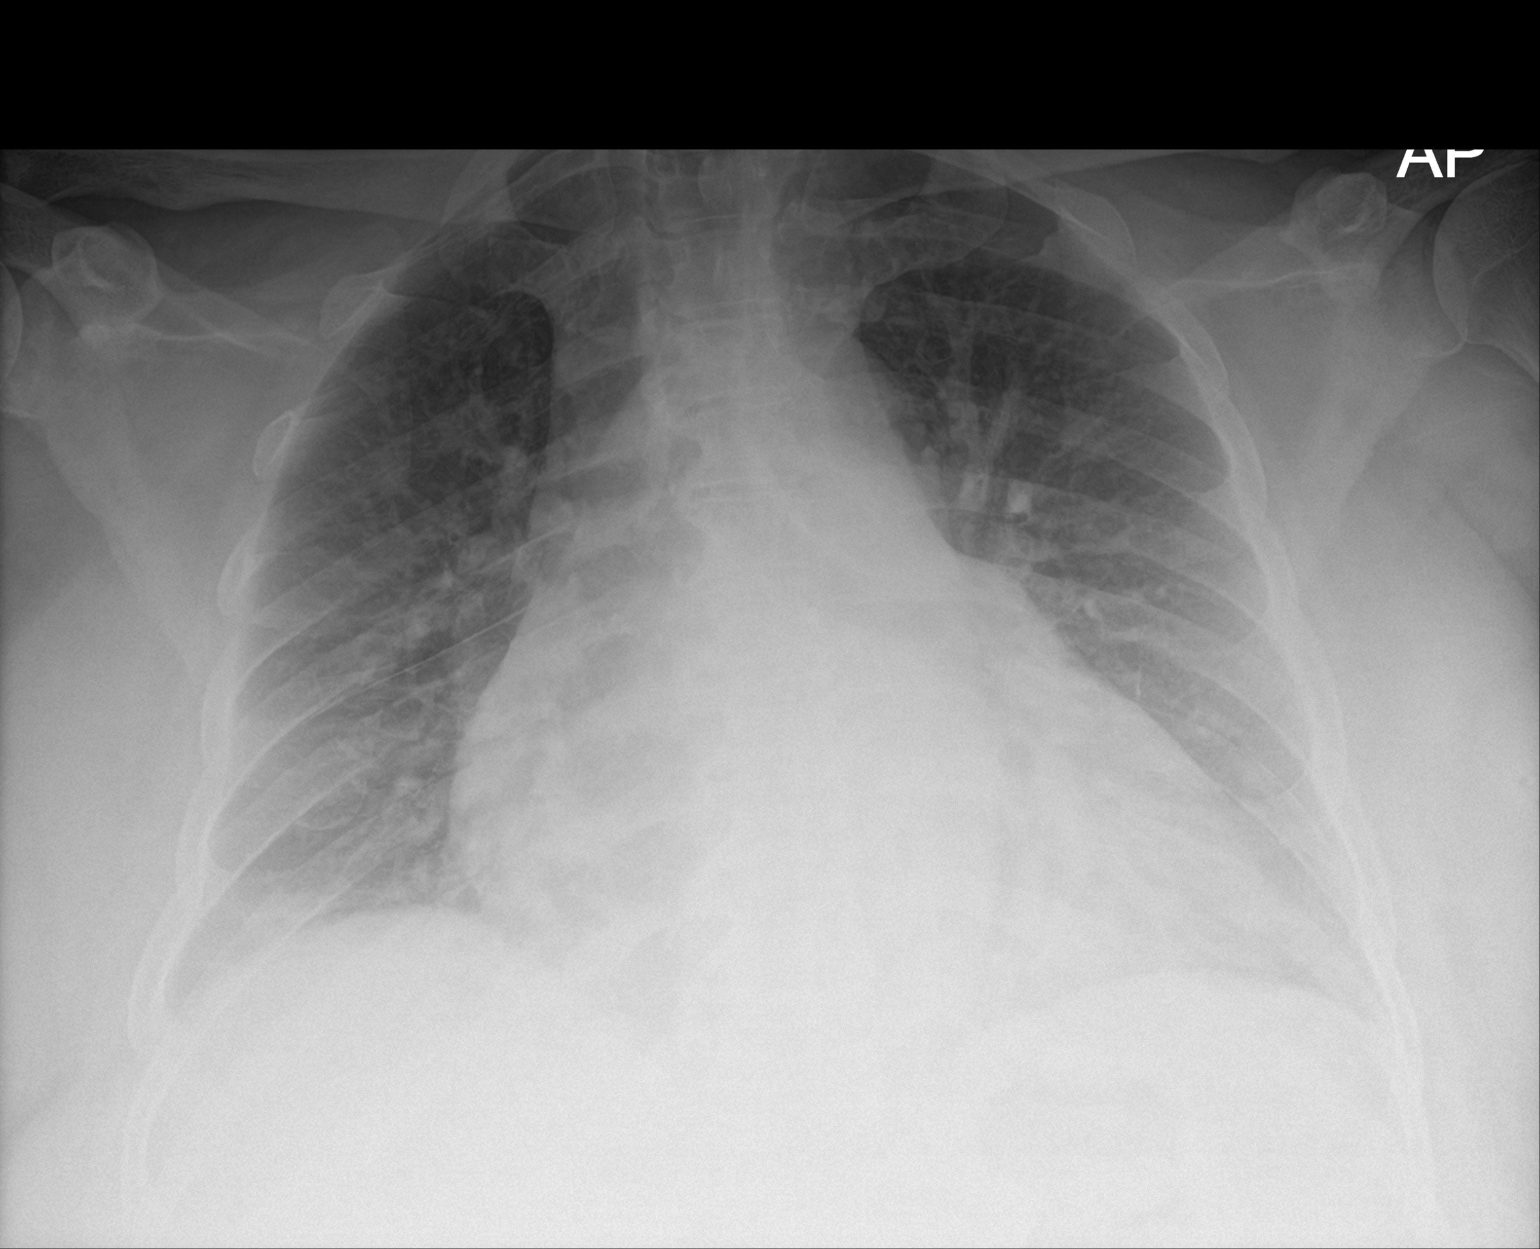

[chest lat]
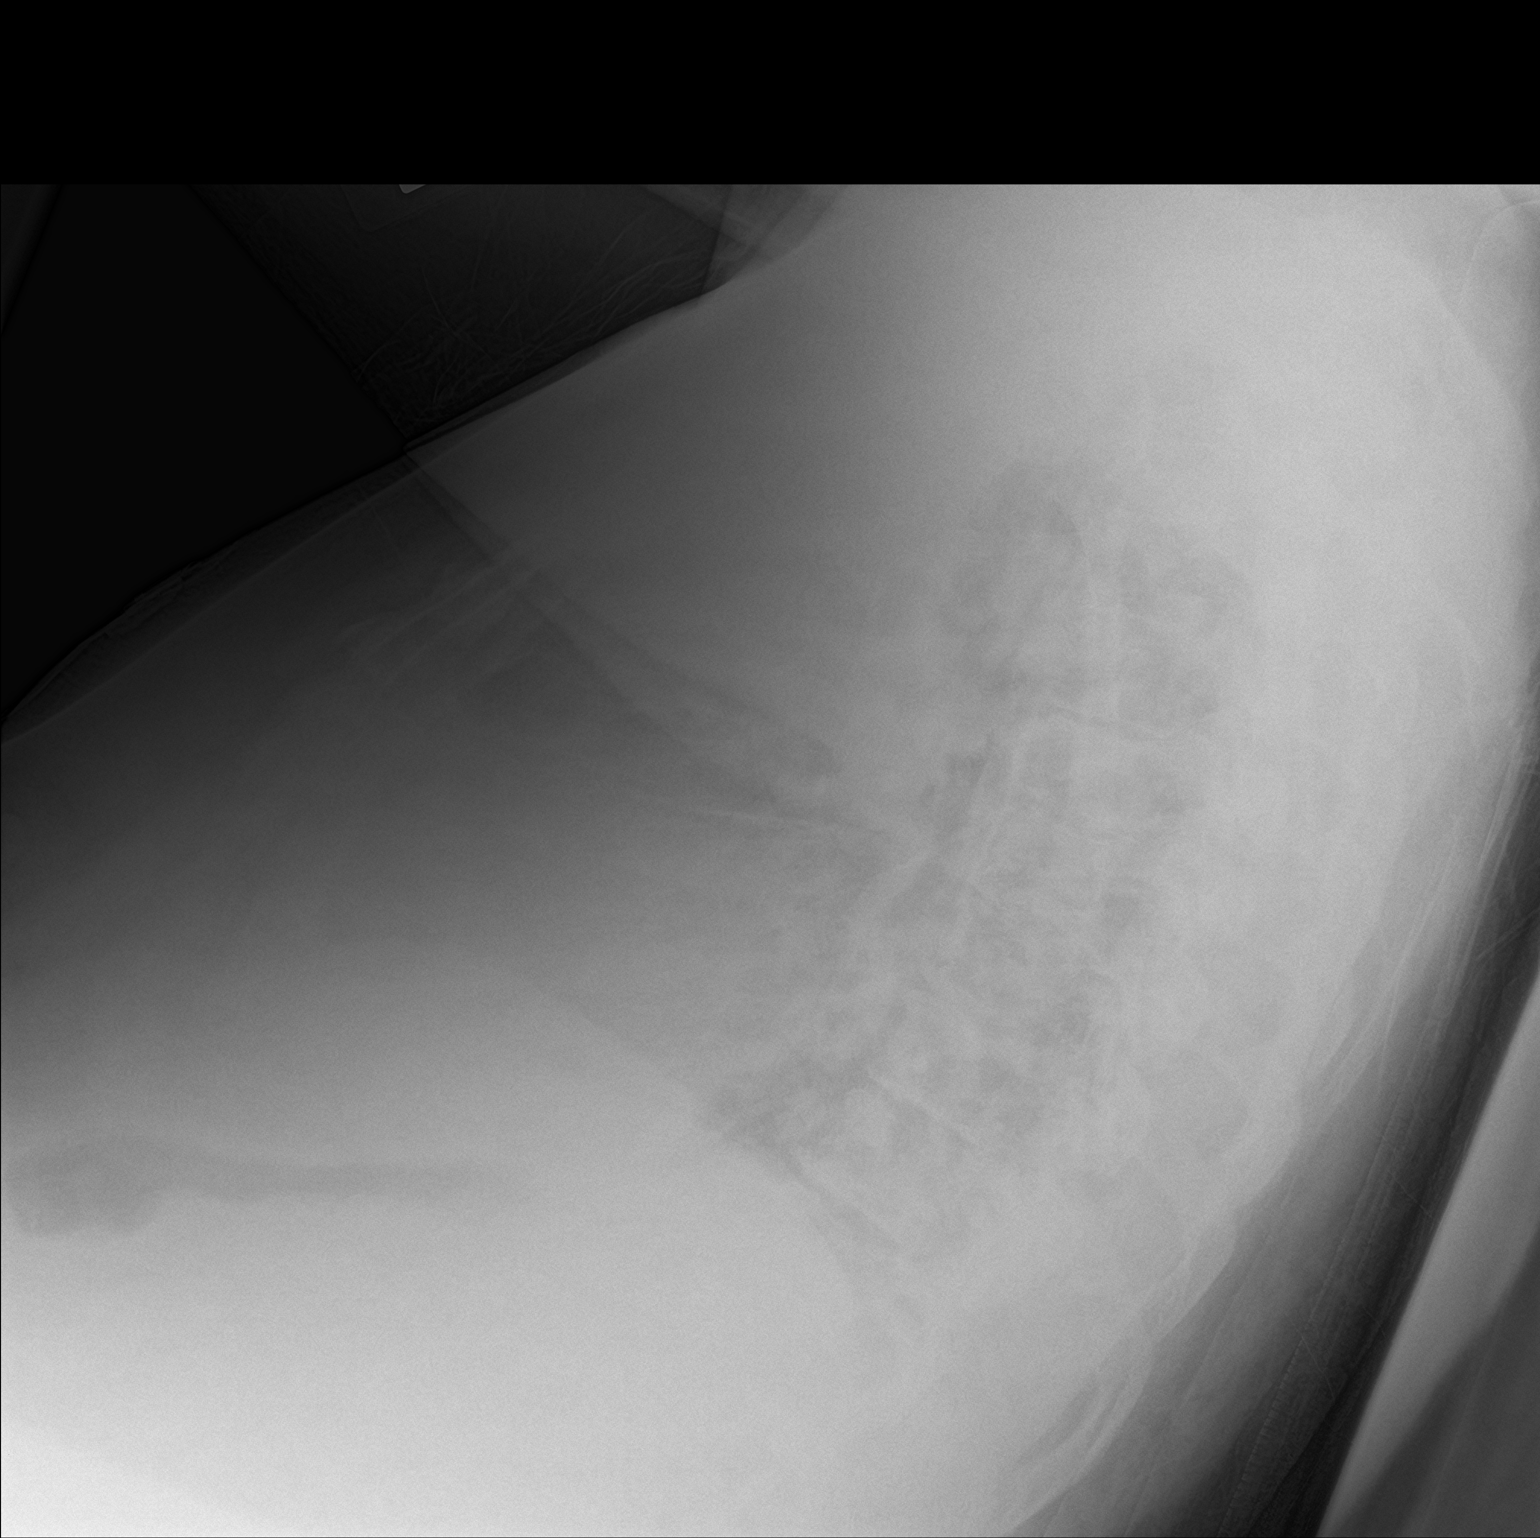

[2 of 2 positions shown; findings below may reference images not displayed]

FINDINGS: Cardiac silhouette markedly enlarged, unchanged. Pulmonary venous
hypertension, improved since examination 2 days ago, without overt
edema currently. Mild atelectasis at the right lung base, unchanged.
No new pulmonary parenchymal abnormalities.
IMPRESSION: 1. Pulmonary venous hypertension, improved since examination 2 days
ago, without overt edema currently.
2. Stable mild right basilar atelectasis. No acute cardiopulmonary
disease otherwise.

## 2017-06-10 DIAGNOSIS — I5043 Acute on chronic combined systolic (congestive) and diastolic (congestive) heart failure: Secondary | ICD-10-CM | POA: Diagnosis not present

## 2017-06-13 IMAGING — CR DG ANKLE 2V *R*
1 series · 2 of 2 positions shown · non-contrast
Comparison: Right tibia and fibula March 26, 2011

CLINICAL DATA: Pain and swelling, chronic

EXAM:
RIGHT ANKLE - 2 VIEW

[Series 2: ap · 0.17mm/px · 2 of 2 slices shown]
[im 1/2]
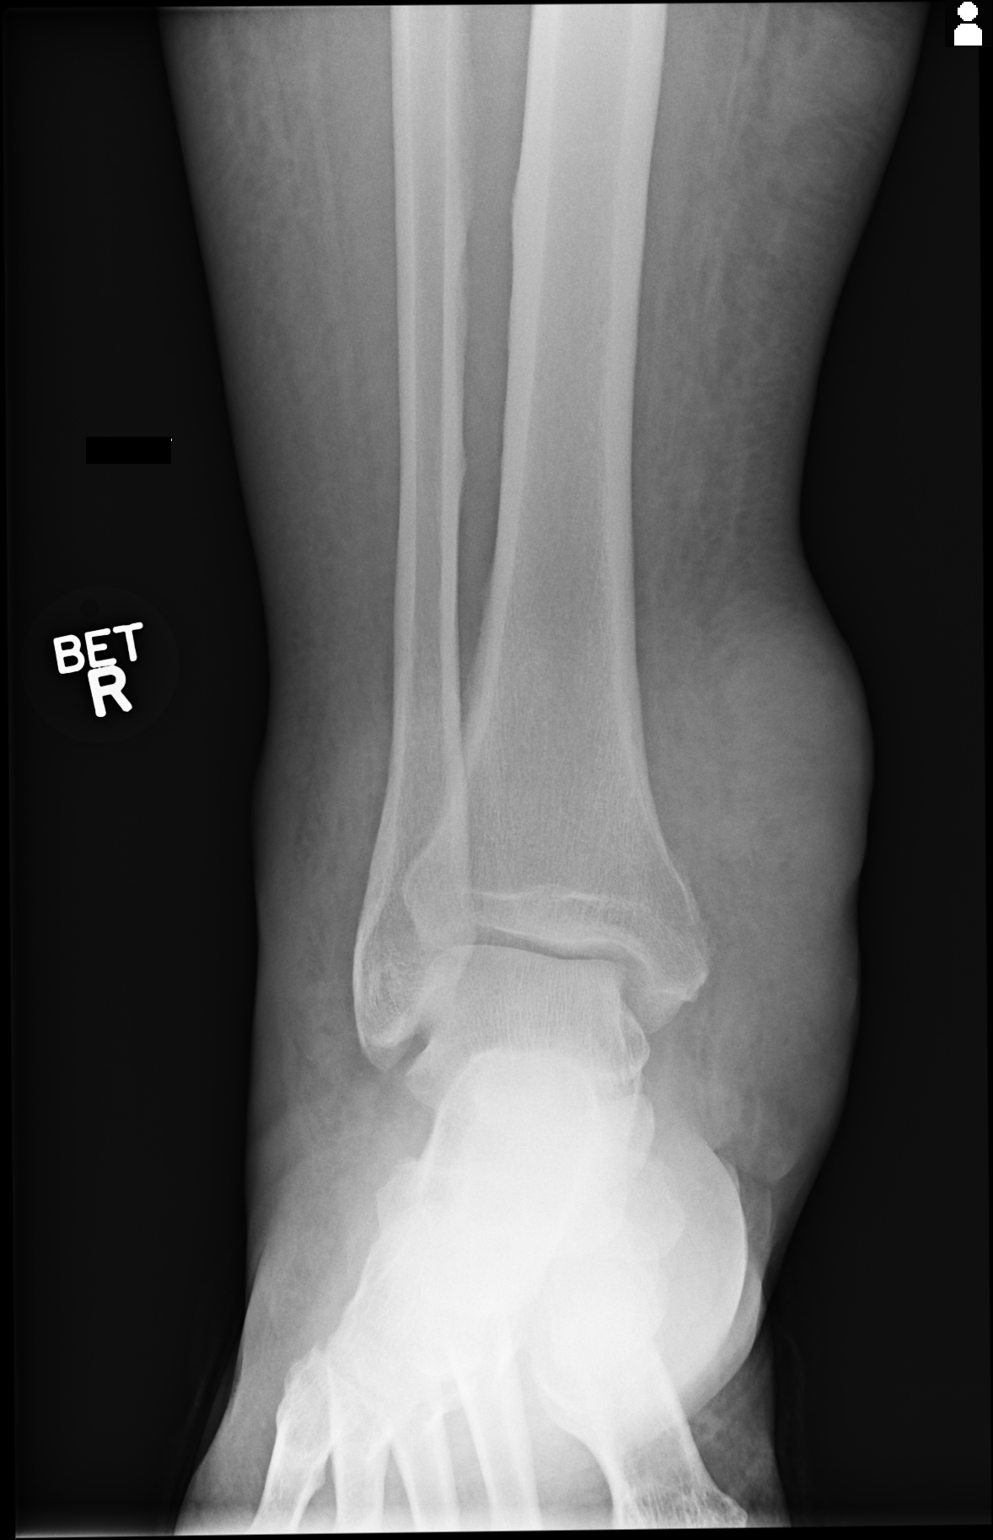
[im 2/2]
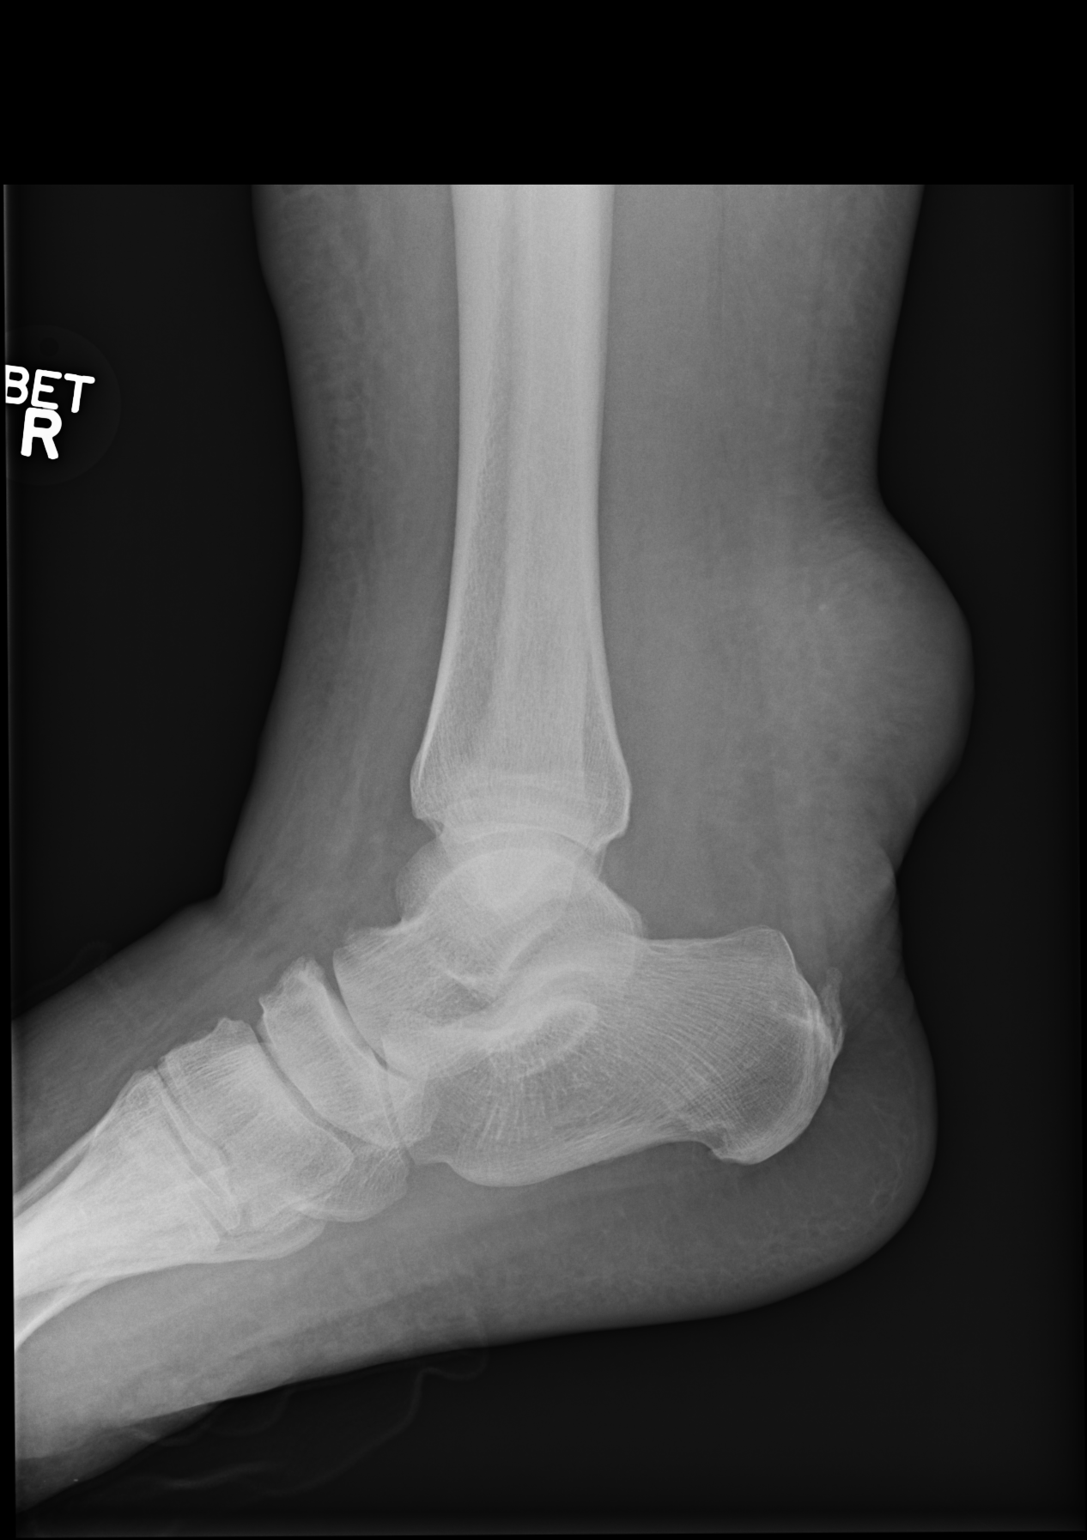

[2 of 2 positions shown; findings below may reference images not displayed]

FINDINGS: Frontal lateral views were obtained. There is marked soft tissue
swelling in the ankle region, most severely medially and
posteriorly, stable from 2872. No fracture or joint effusion. Ankle
mortise appears intact. No appreciable joint space narrowing. There
is a spur arising from the posterior calcaneus.
IMPRESSION: Marked soft tissue swelling, most severe medially and posteriorly,
unchanged from 2872. No fracture or appreciable joint space
narrowing. Ankle mortise appears intact. Stable posterior calcaneal
spur.

## 2017-06-28 IMAGING — CR DG CHEST 1V PORT
1 series · 1 of 1 positions shown · non-contrast
Comparison: October 07, 2015

CLINICAL DATA: Two day history of shortness of breath

EXAM:
PORTABLE CHEST 1 VIEW

[ap portable]
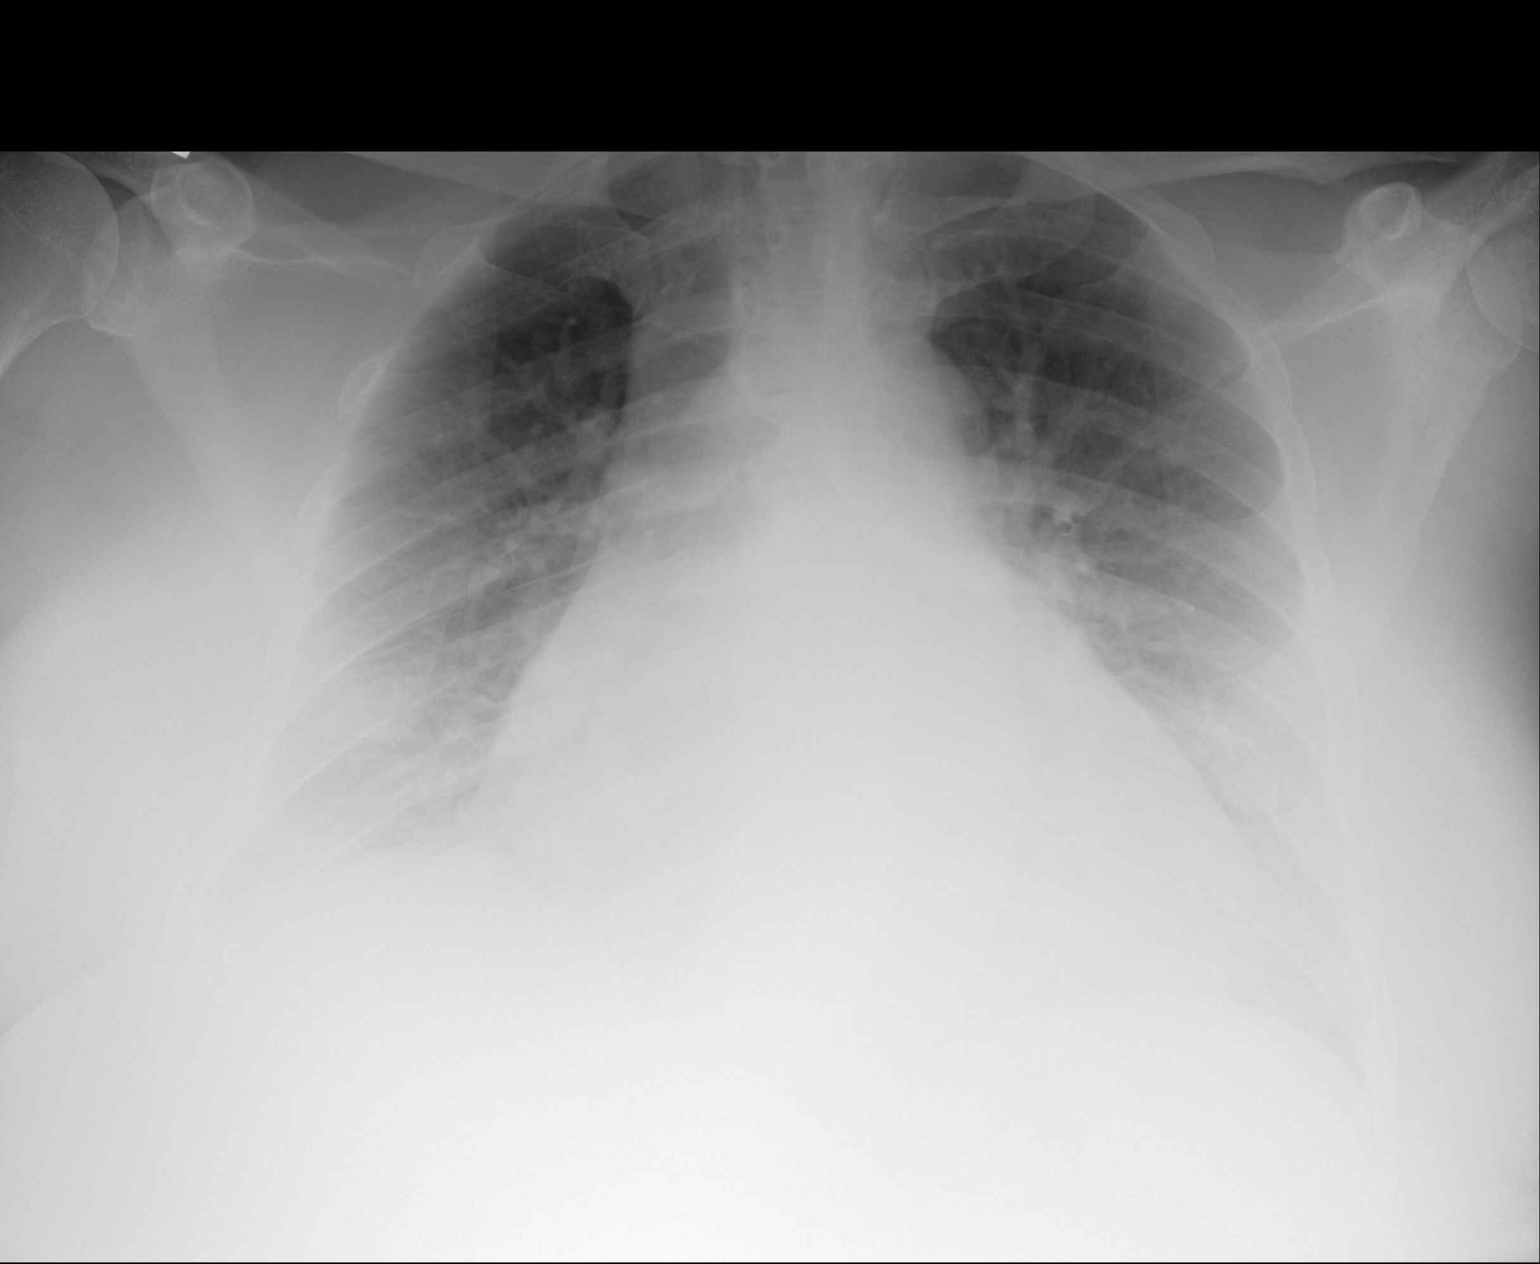

[1 of 1 positions shown; findings below may reference images not displayed]

FINDINGS: There is no edema or consolidation. There is generalized cardiac
enlargement with pulmonary vascularity within normal limits. No
adenopathy. No bone lesions.
IMPRESSION: Stable generalized cardiomegaly.  No edema or consolidation.

## 2017-06-30 IMAGING — DX DG CHEST 2V
2 series · 2 of 2 positions shown · non-contrast
Comparison: 10/22/2015

CLINICAL DATA: Shortness of Breath

EXAM:
CHEST  2 VIEW

[chest pa]
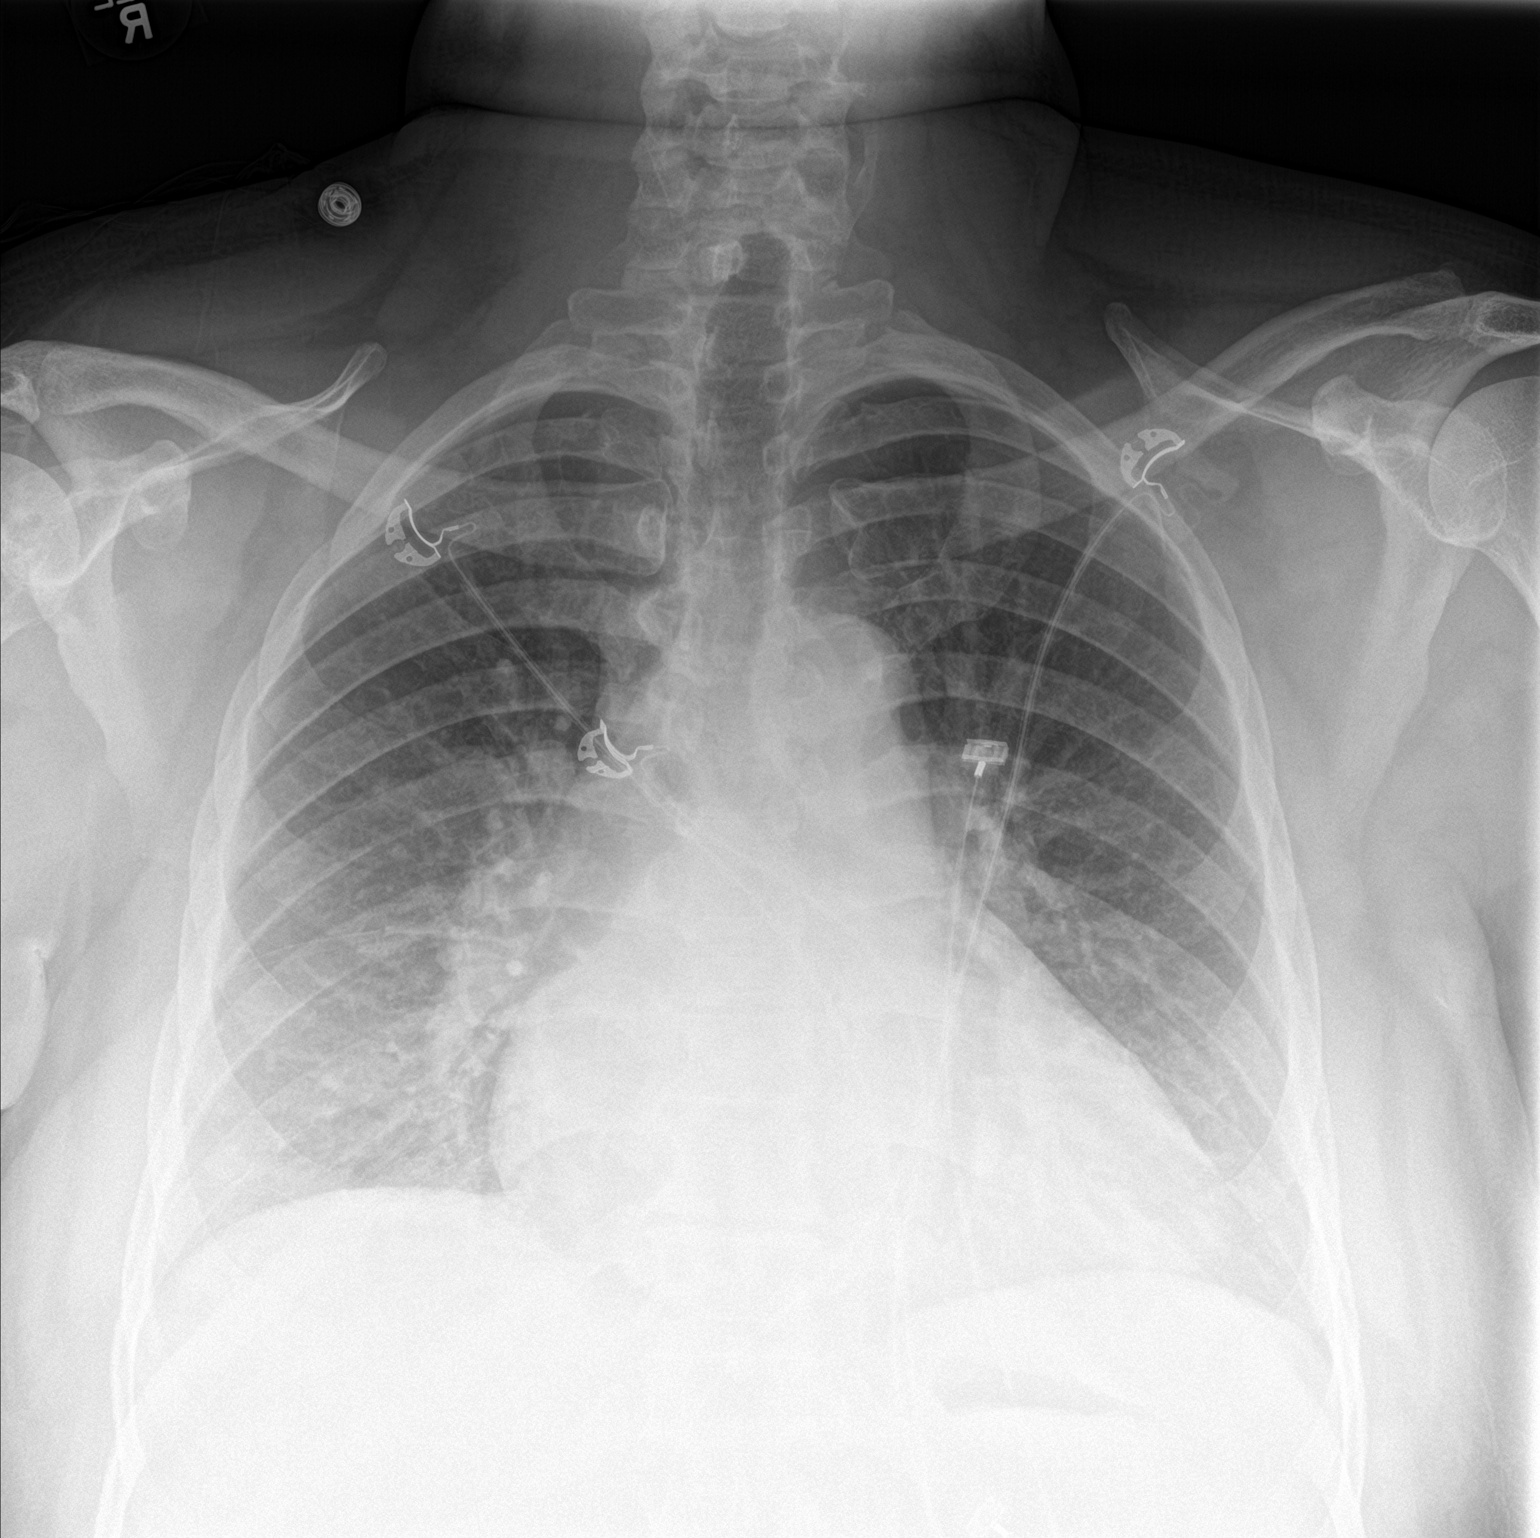

[chest lat]
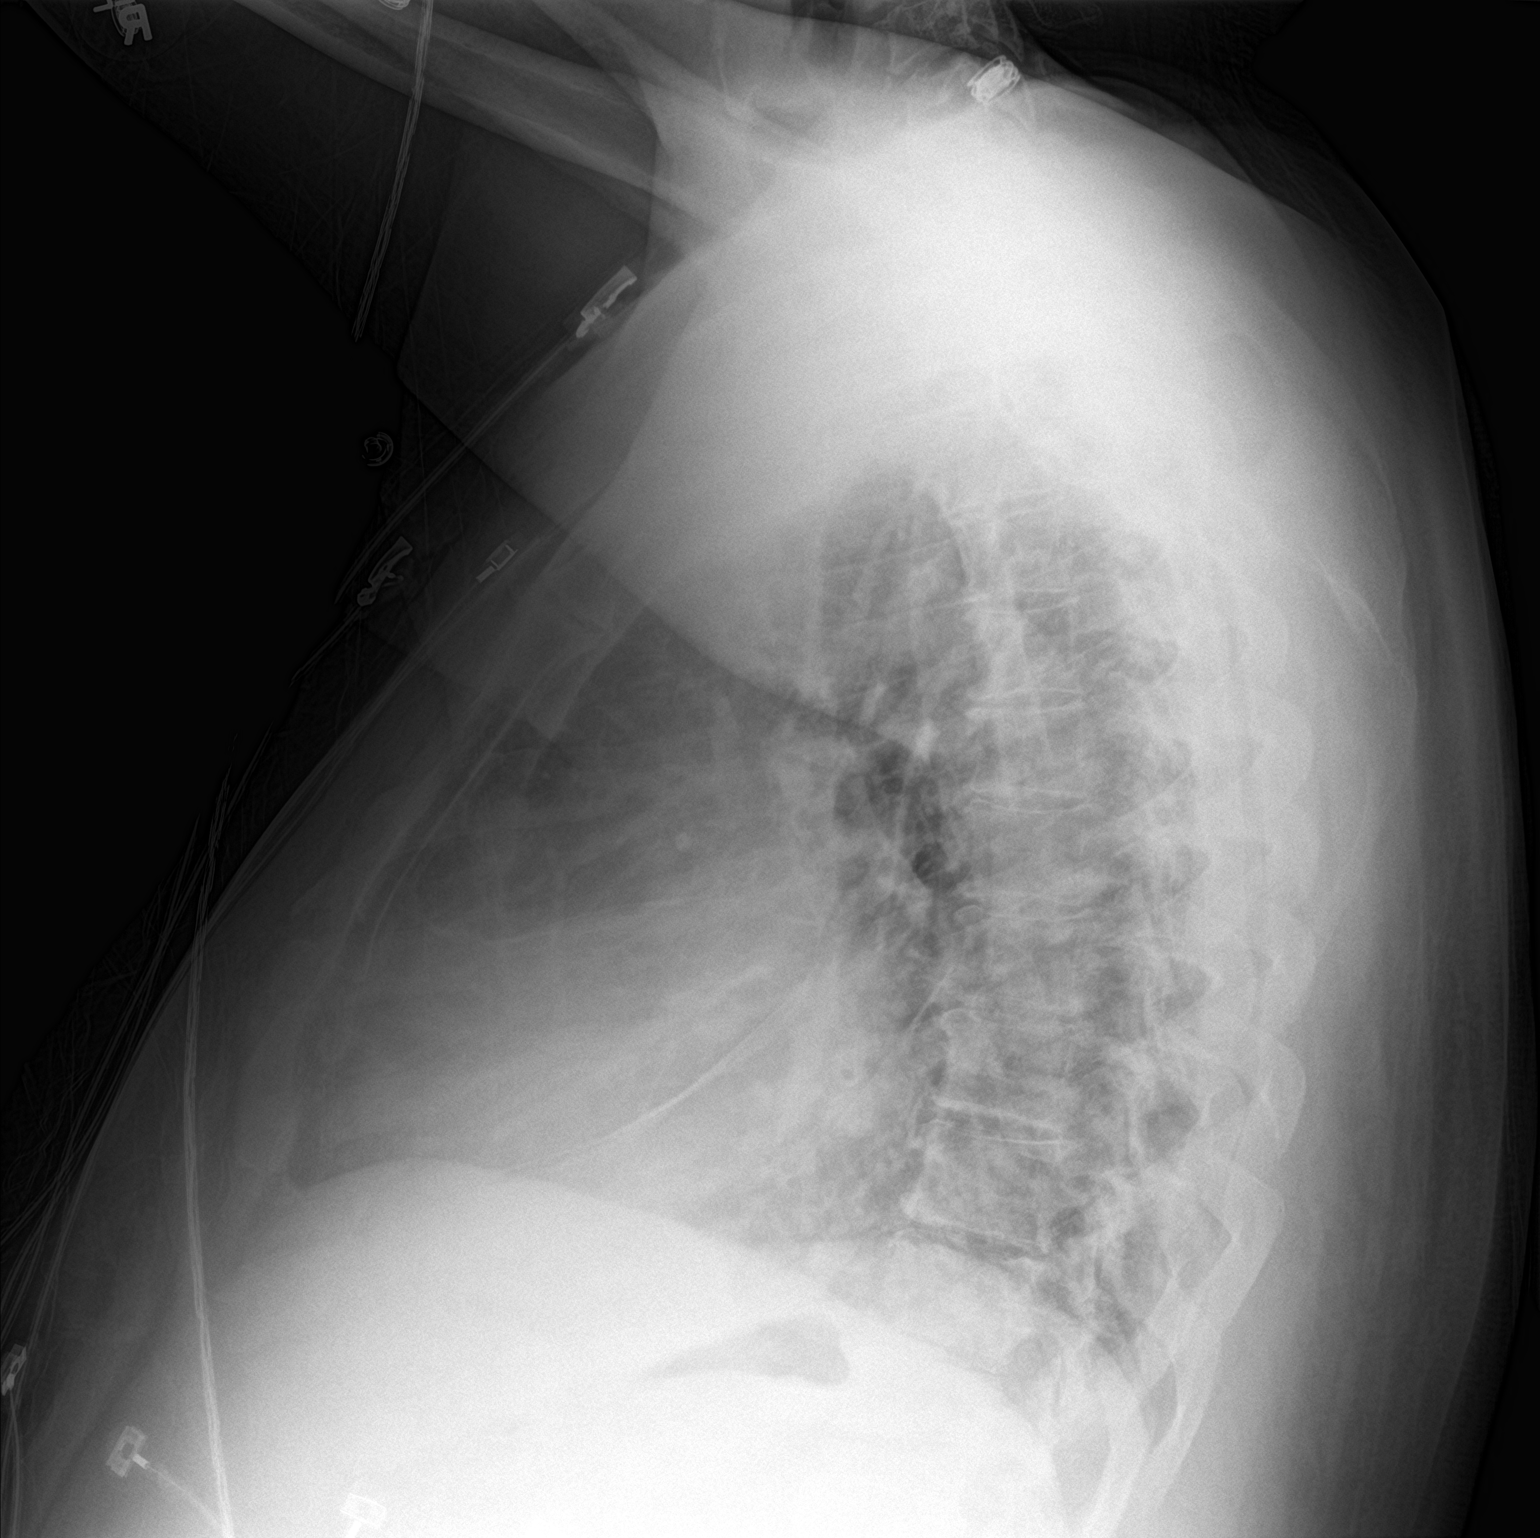

[2 of 2 positions shown; findings below may reference images not displayed]

FINDINGS: The cardiac shadow is enlarged but stable. The lungs are well
aerated bilaterally. No focal infiltrate or sizable effusion is
seen. No bony abnormality is noted.
IMPRESSION: No active cardiopulmonary disease.

## 2017-07-02 IMAGING — DX DG CHEST 2V
2 series · 2 of 2 positions shown · non-contrast
Comparison: Chest radiograph 10/24/2015

CLINICAL DATA: Patient with leg swelling and fluid retention.

EXAM:
CHEST  2 VIEW

[w chest lat]
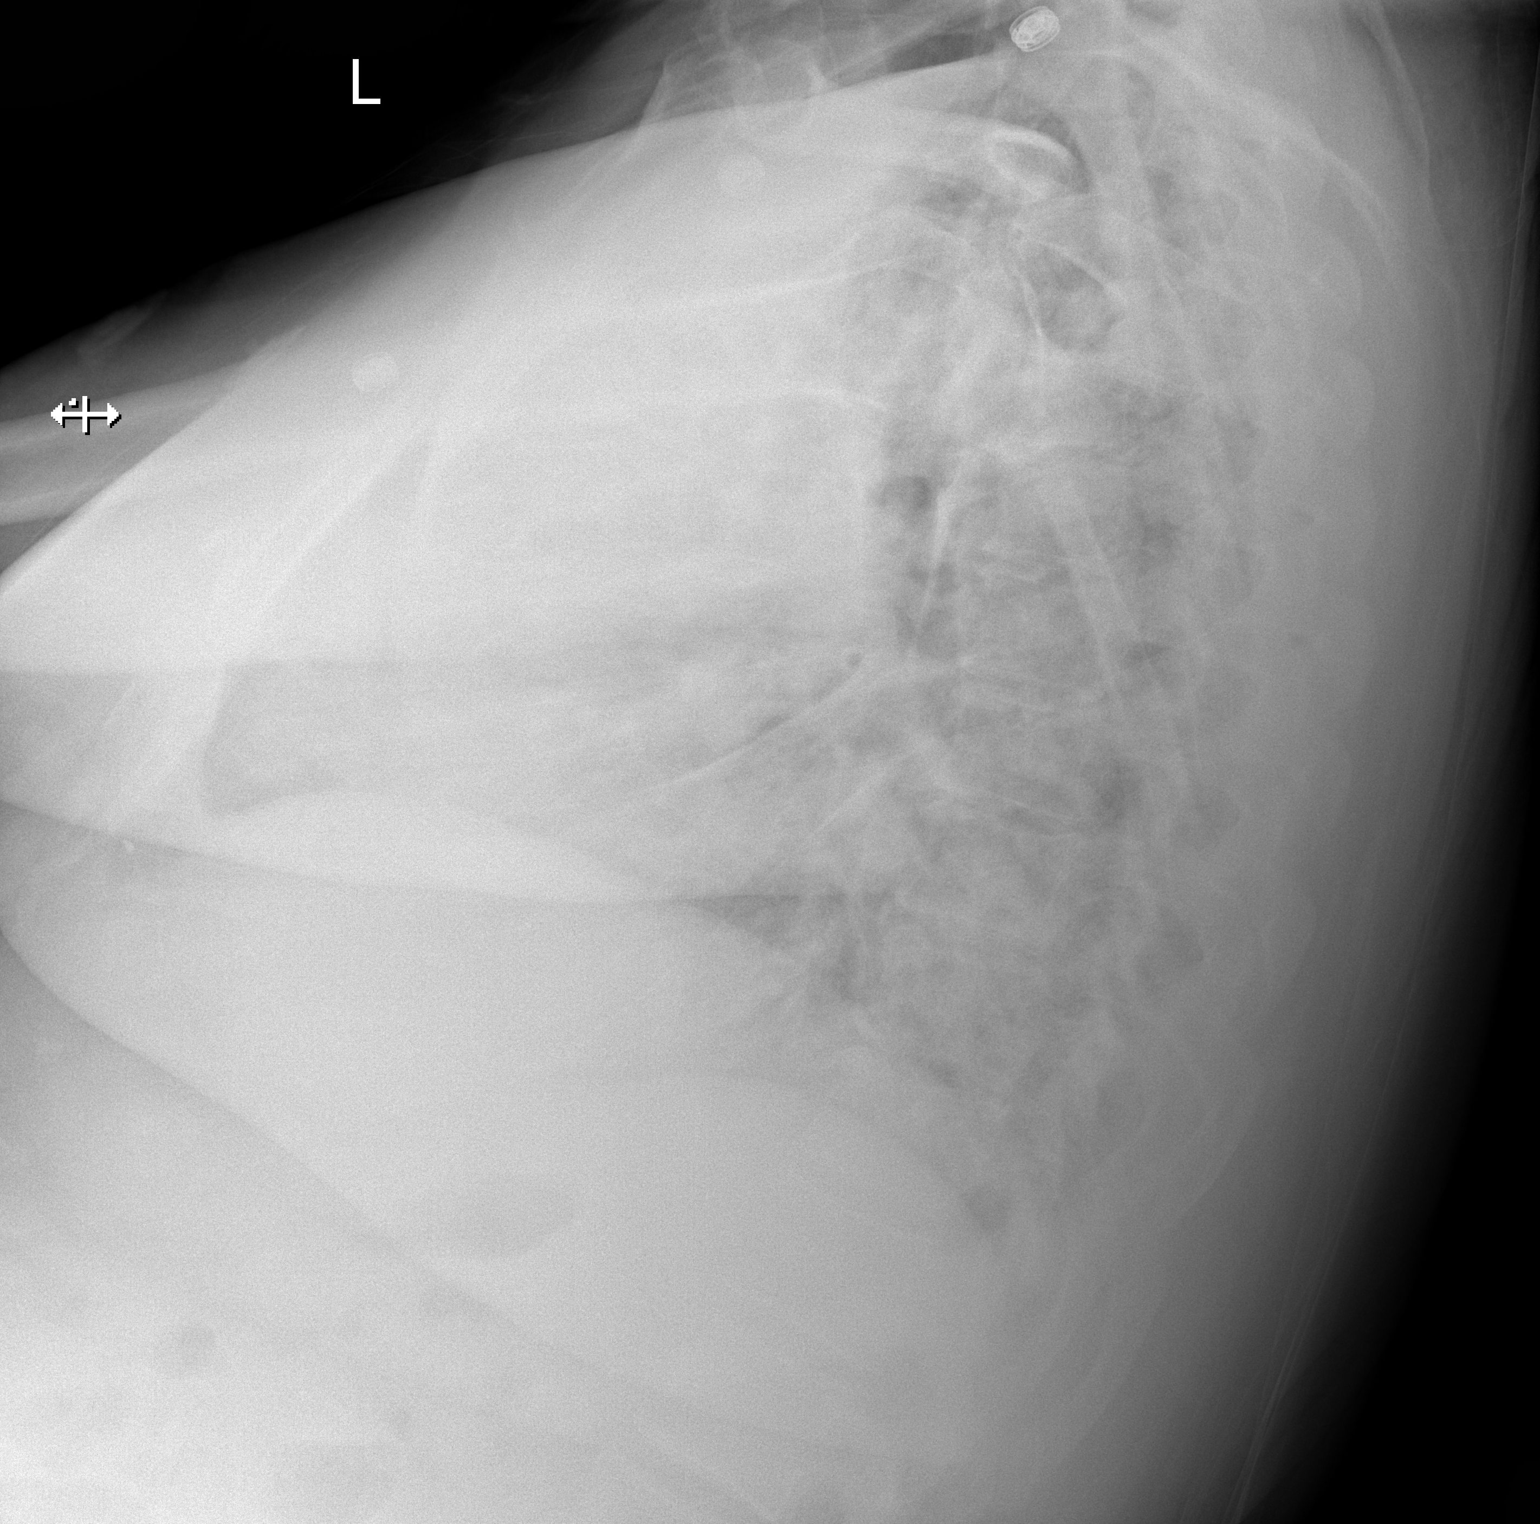

[x chest ap]
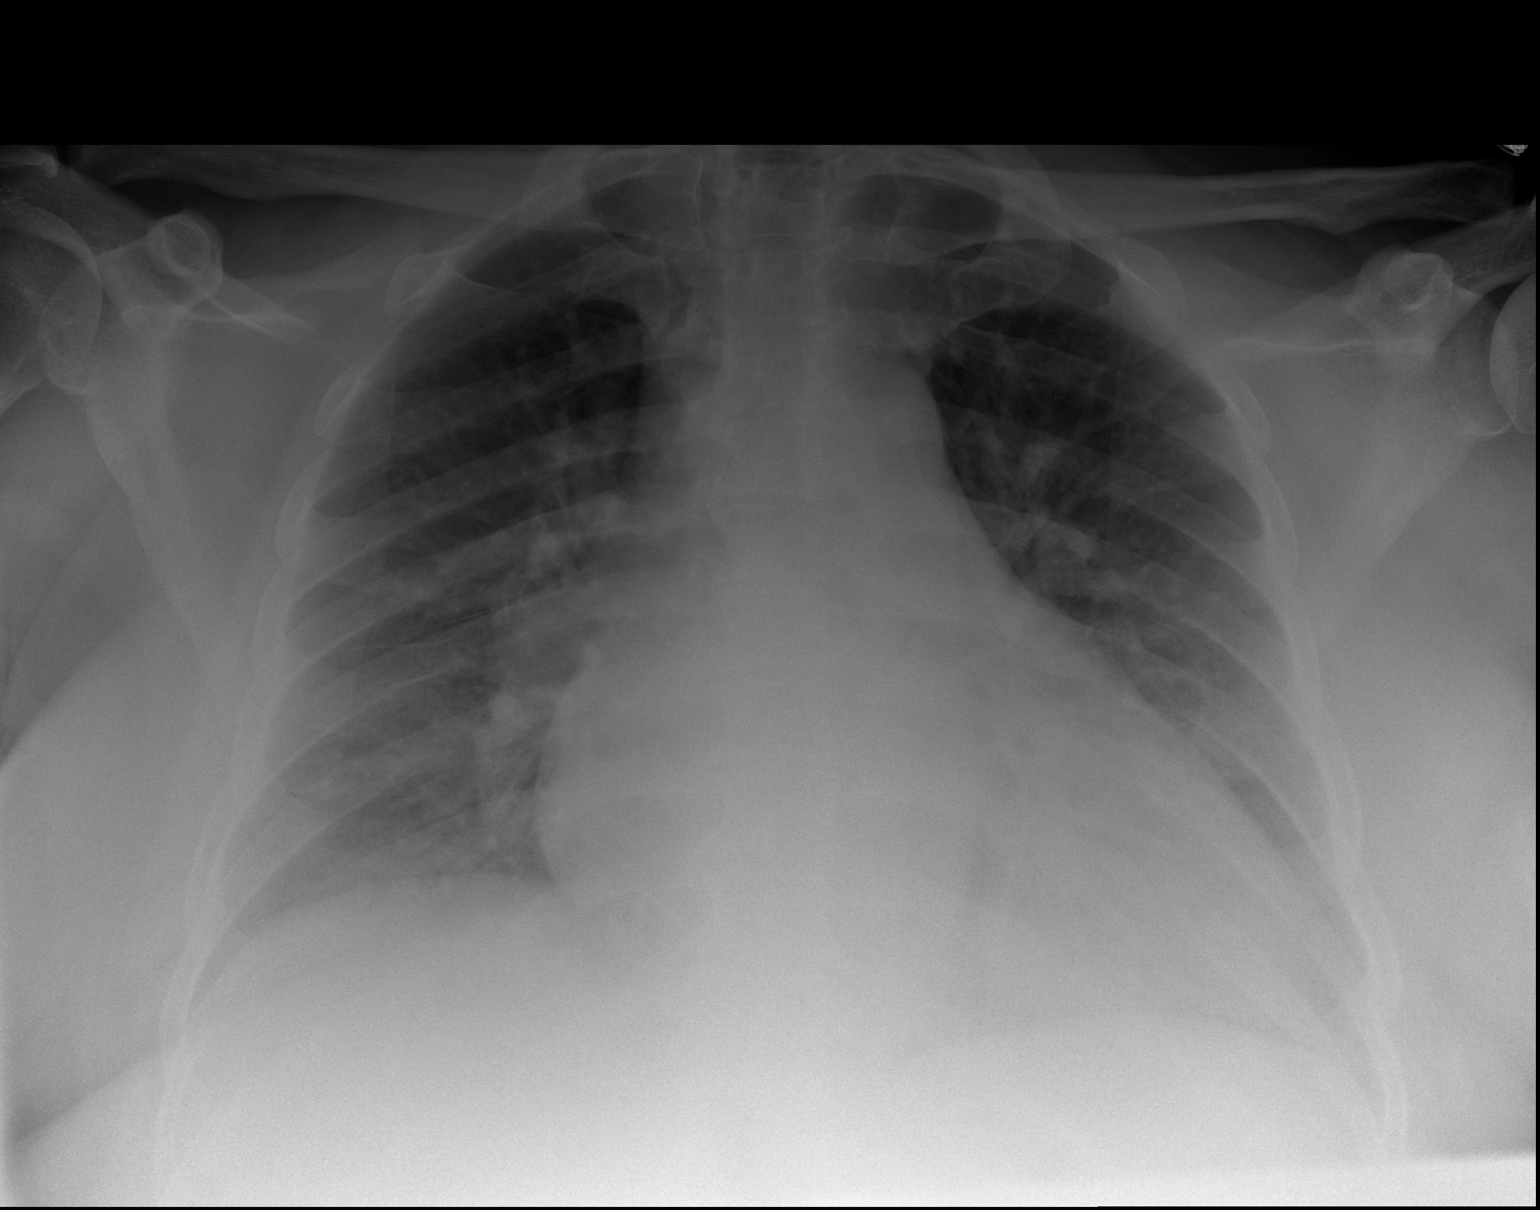

[2 of 2 positions shown; findings below may reference images not displayed]

FINDINGS: Stable cardiomegaly. Pulmonary vascular redistribution and mild
interstitial pulmonary opacities bilaterally. No definite pleural
effusion. Thoracic spine degenerative changes. Lateral view limited
due to overlapping soft tissue.
IMPRESSION: Cardiomegaly, pulmonary vascular redistribution and mild
interstitial edema.

## 2017-07-05 IMAGING — CR DG CHEST 1V PORT
1 series · 1 of 1 positions shown · non-contrast
Comparison: 10/26/2015

CLINICAL DATA: Fever and weakness.  Dyspnea tonight.

EXAM:
PORTABLE CHEST 1 VIEW

[AP]
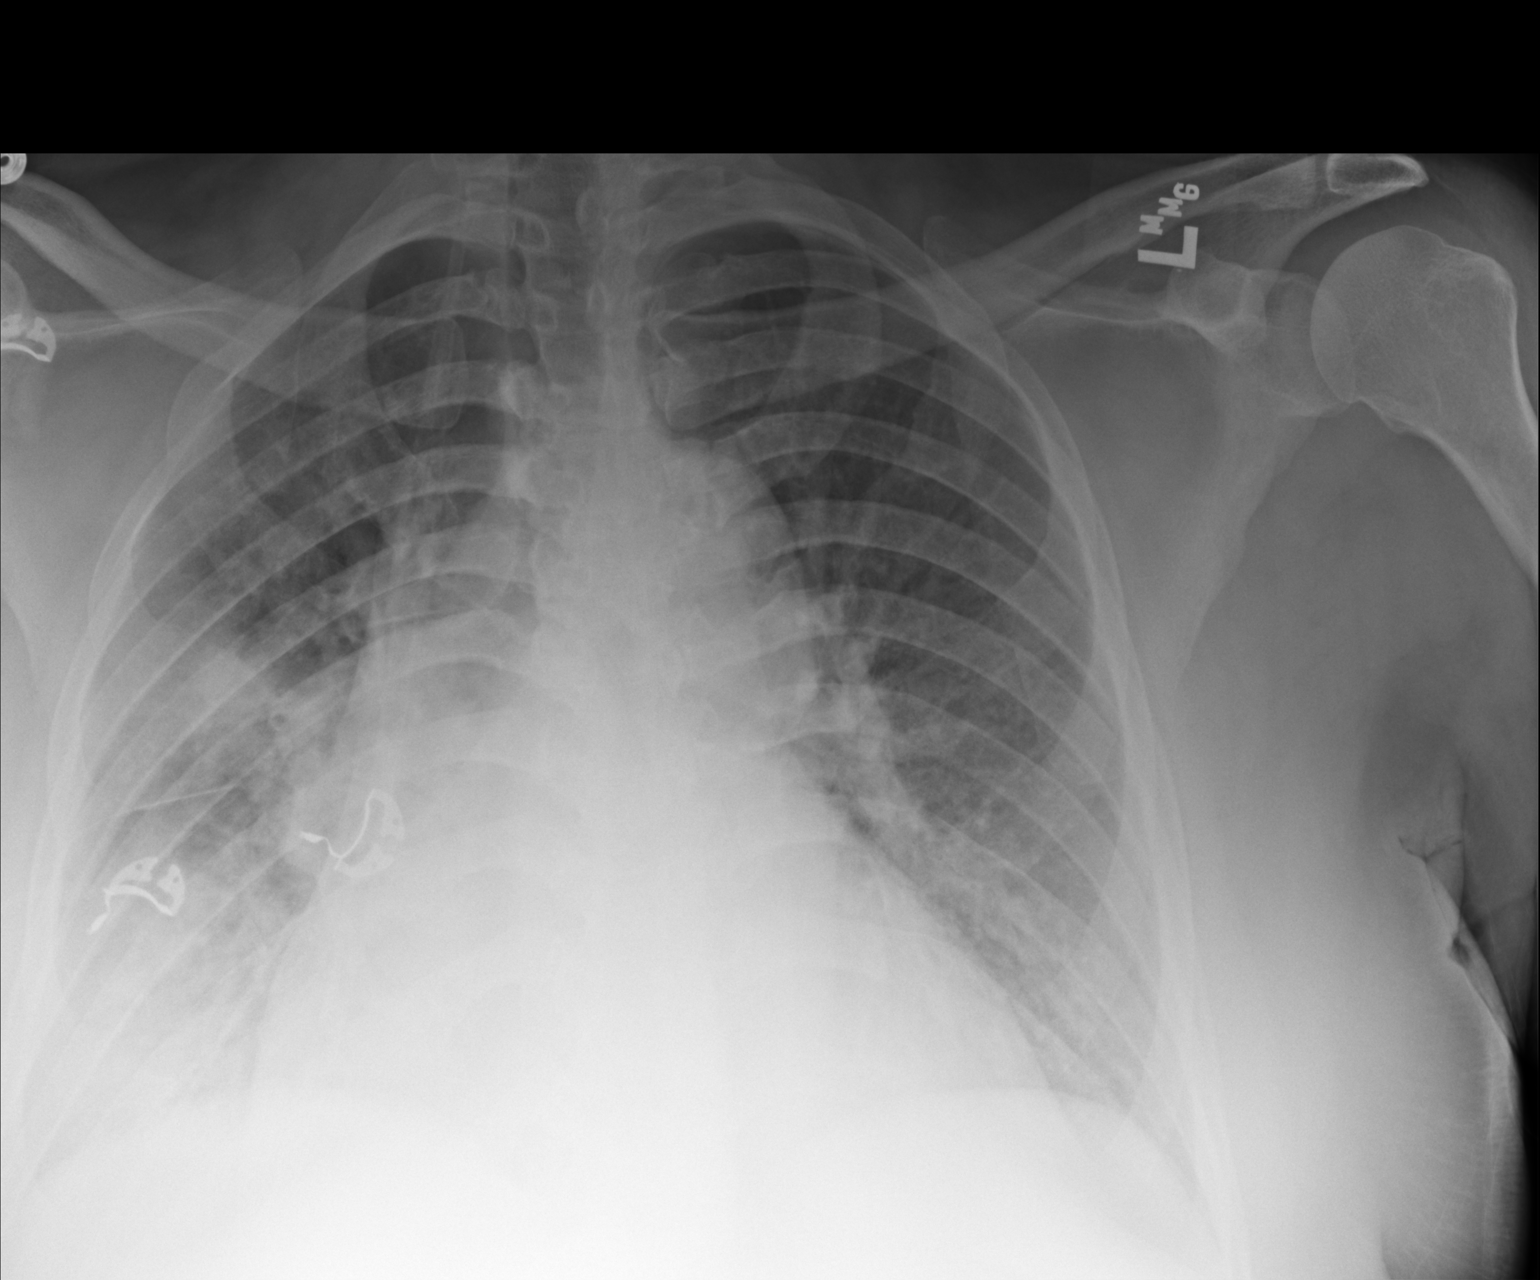

[1 of 1 positions shown; findings below may reference images not displayed]

FINDINGS: There is moderate cardiomegaly, unchanged. Patchy consolidation in
the right upper lobe and right lower lobe may represent pneumonia or
asymmetric alveolar edema. No large effusions.
IMPRESSION: New patchy airspace opacity in the right lung, consistent with
pneumonia or asymmetric alveolar edema.

## 2017-07-07 IMAGING — CR DG CHEST 2V
2 series · 2 of 2 positions shown · non-contrast
Comparison: 10/29/2015

CLINICAL DATA: Lower extremity swelling, recent pneumonia

EXAM:
CHEST  2 VIEW

[chest pa]
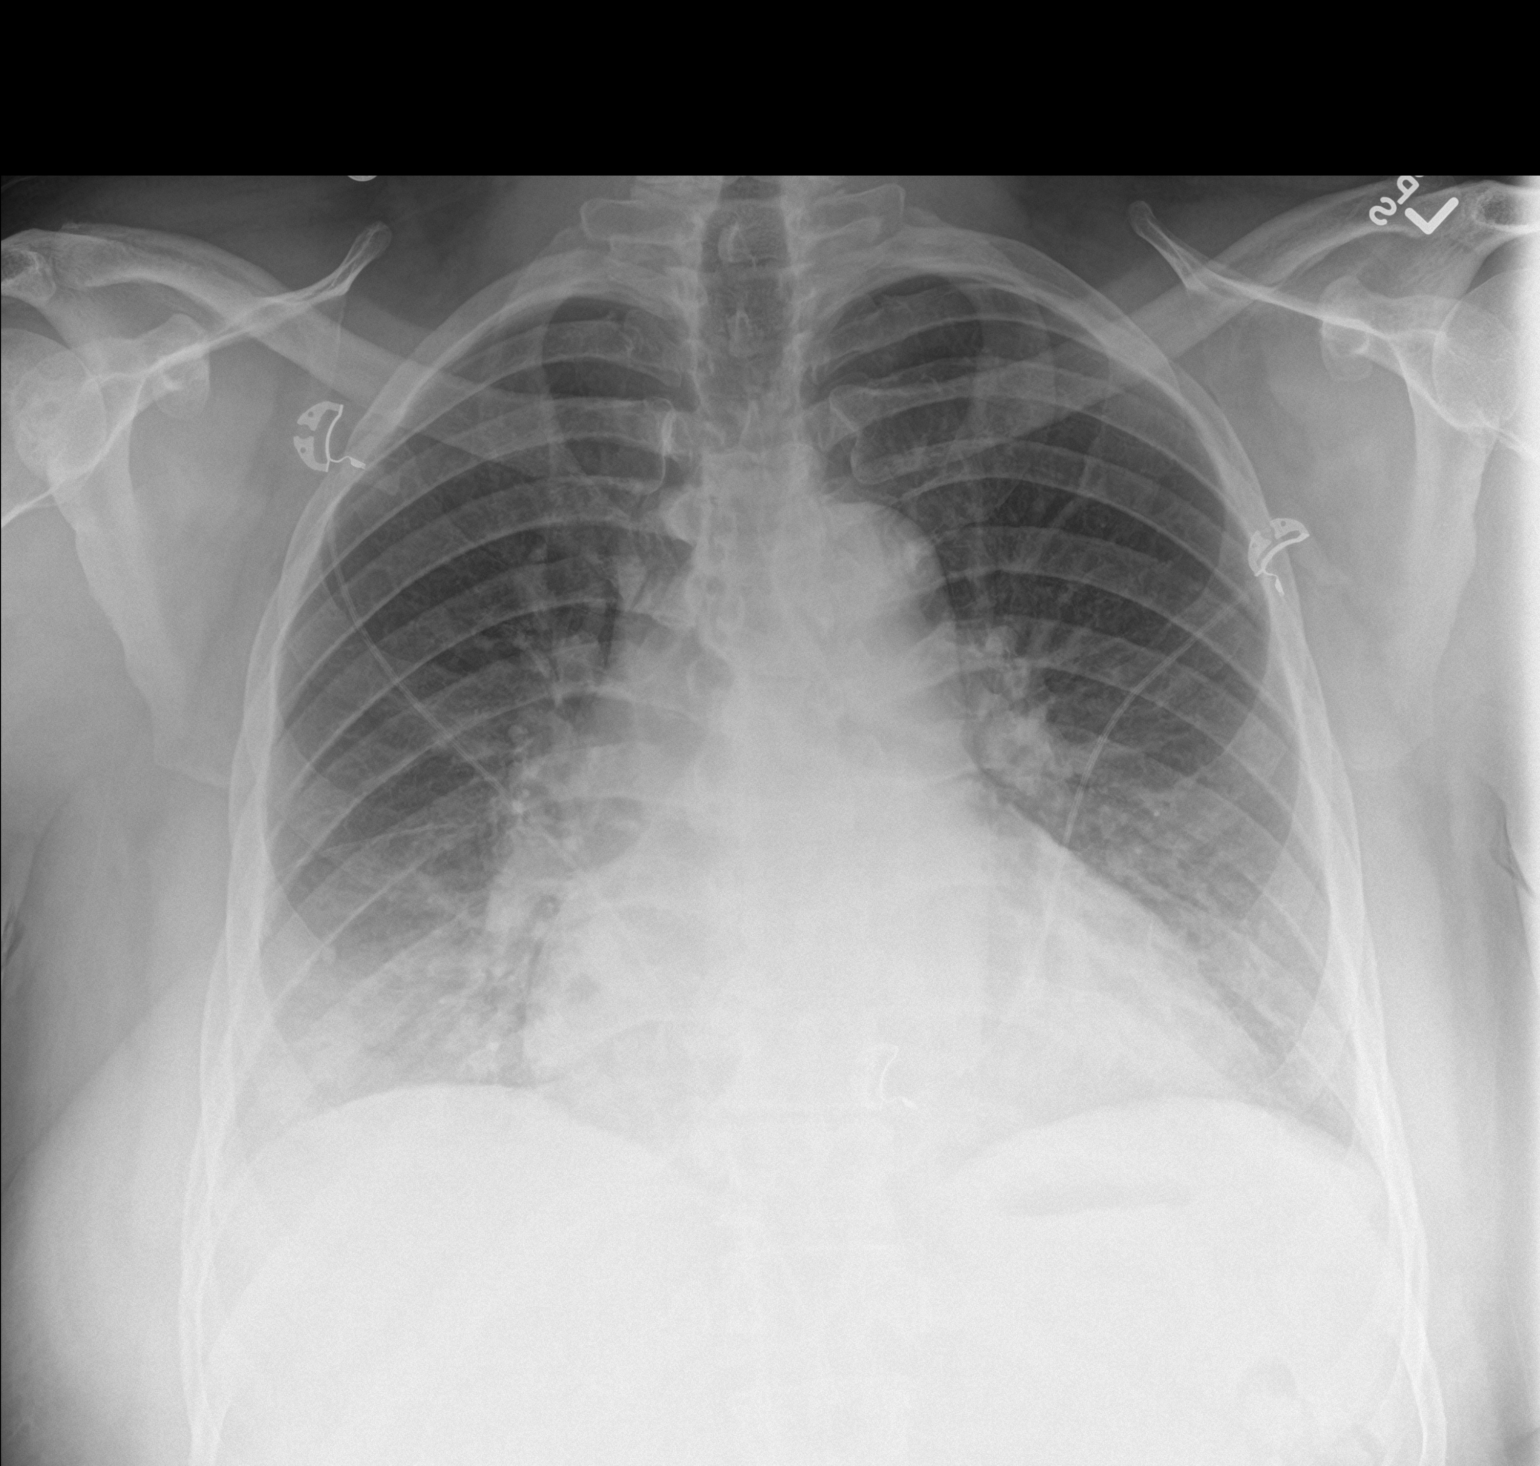

[chest lat]
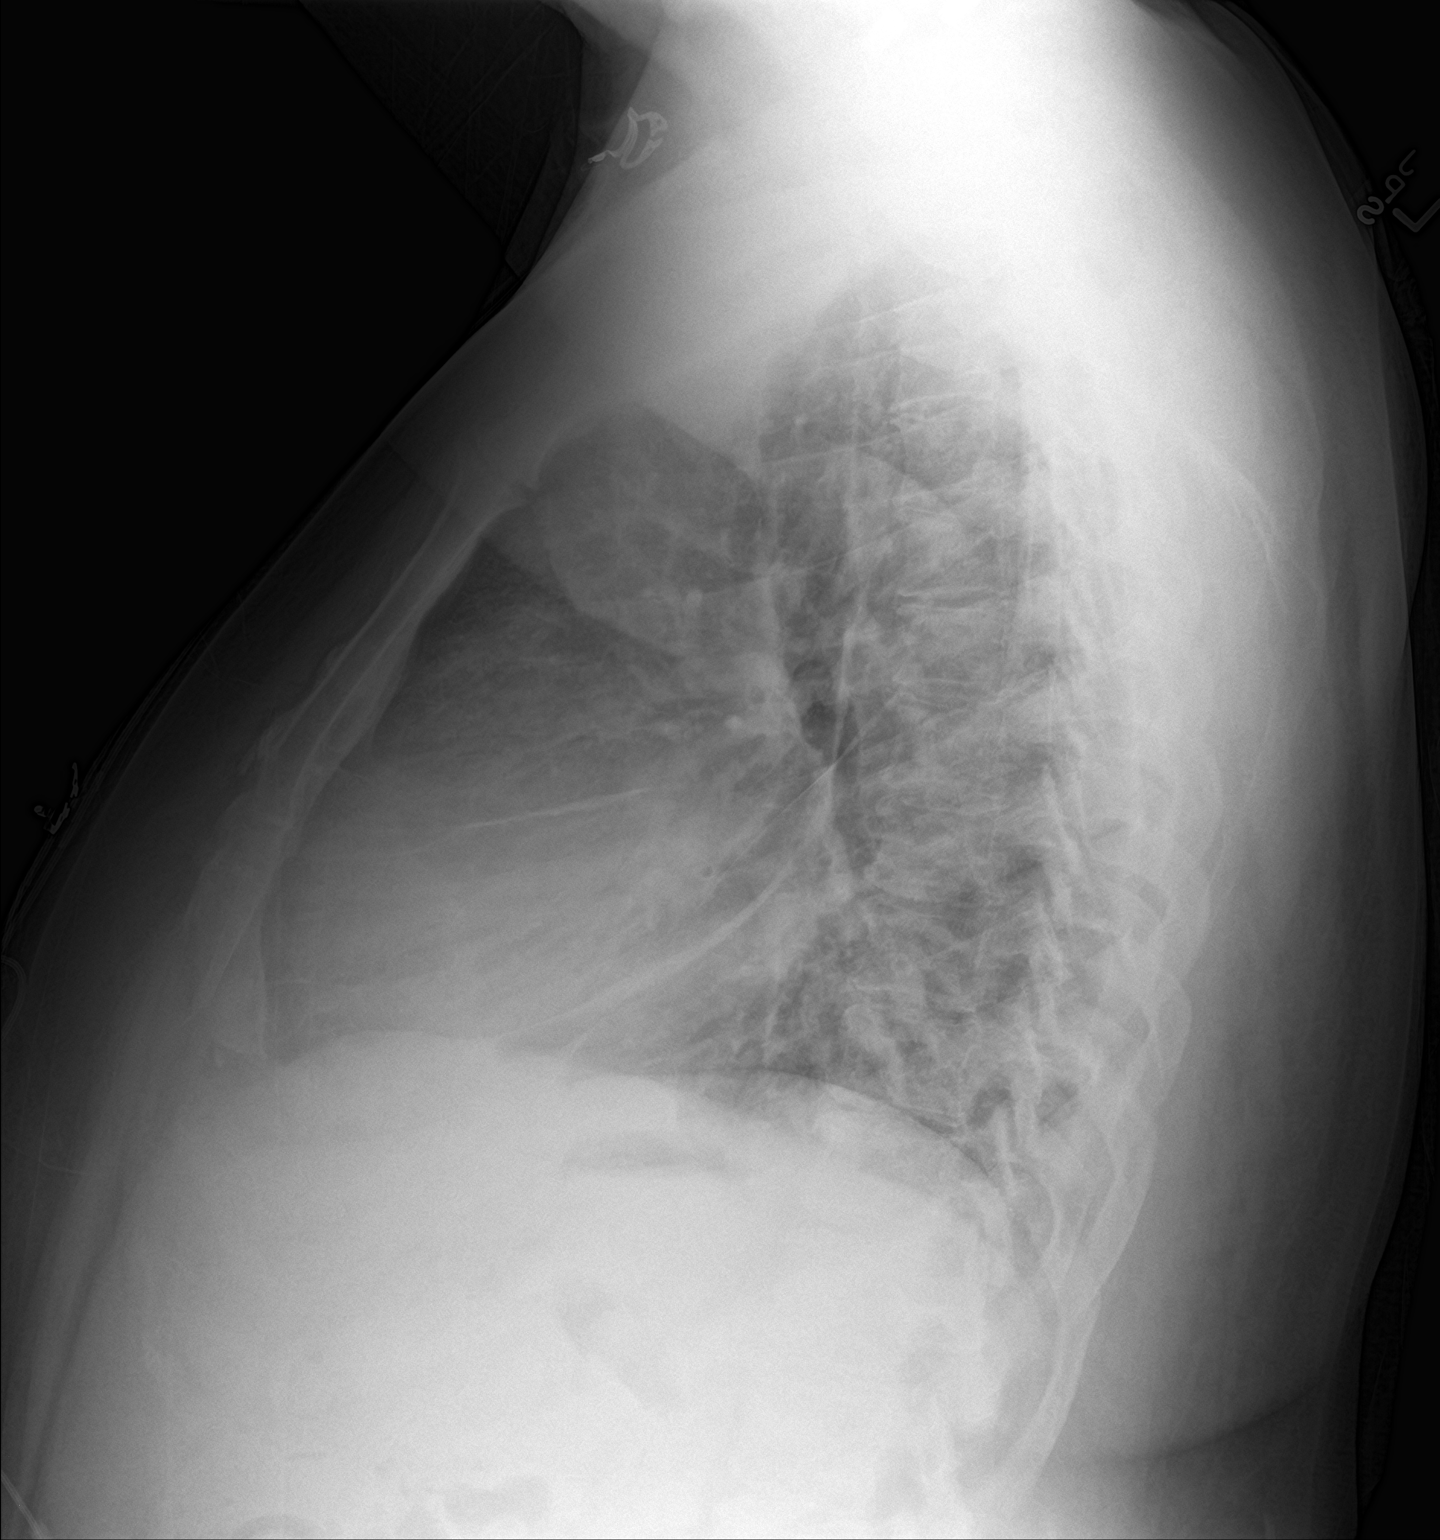

[2 of 2 positions shown; findings below may reference images not displayed]

FINDINGS: Soft tissues overlying the right lower hemithorax.

No frank interstitial edema, improved. No focal consolidation. No
definite pleural effusion on the lateral view.

Cardiomegaly.
IMPRESSION: No frank interstitial edema, improved.

No evidence of acute cardiopulmonary disease.

## 2017-07-11 DIAGNOSIS — I5043 Acute on chronic combined systolic (congestive) and diastolic (congestive) heart failure: Secondary | ICD-10-CM | POA: Diagnosis not present

## 2017-07-12 IMAGING — CT CT CHEST W/O CM
2 of 3 series · 15 of 36 positions shown, 18 images · non-contrast
Comparison: 09/19/2015

CLINICAL DATA: Shortness of breath and chest pain for 3 days,
initial encounter

EXAM:
CT CHEST WITHOUT CONTRAST
TECHNIQUE: Multidetector CT imaging of the chest was performed following the
standard protocol without IV contrast.

[Series 2: chestroutine 2.0 b40f · axial · 0.83mm/px · z∈[-168,+94]mm · 12 of 155 slices shown, 15 images]
[im 12/155  mediastinal]
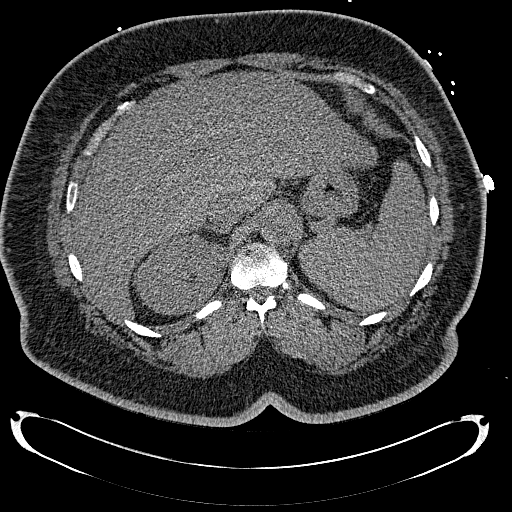
[im 12/155  lung]
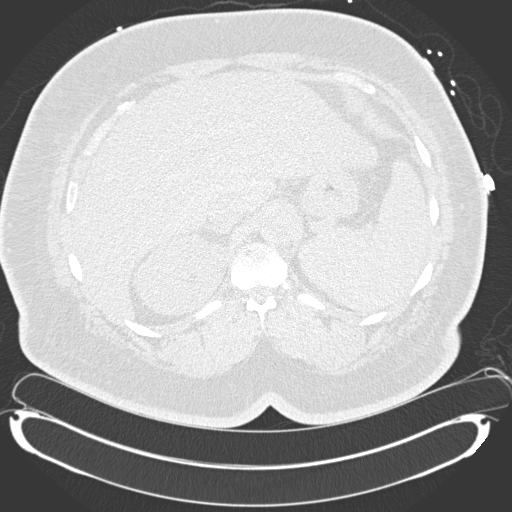
[im 23/155  lung]
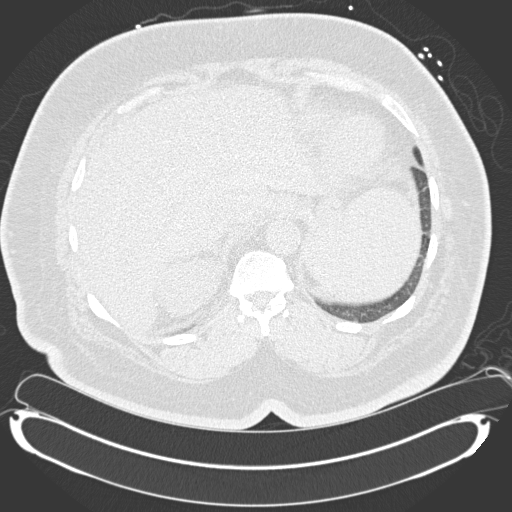
[im 35/155  lung]
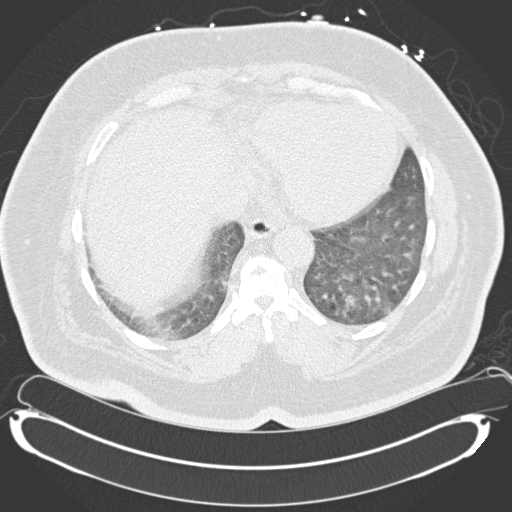
[im 46/155  lung]
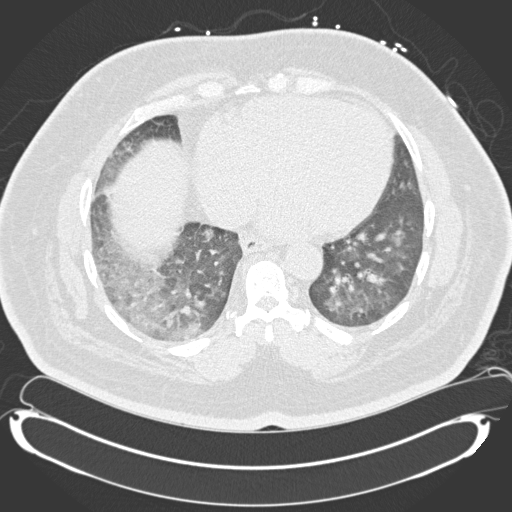
[im 58/155  mediastinal]
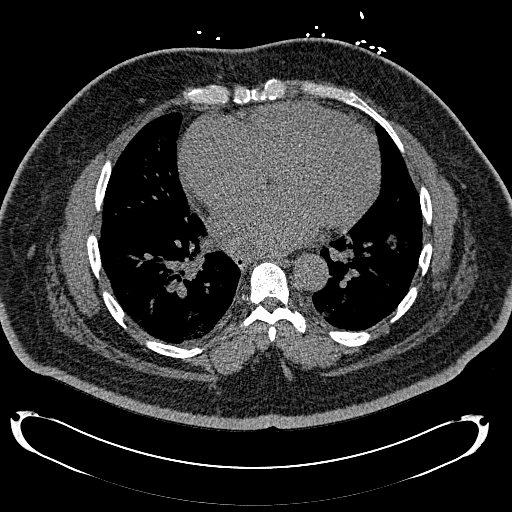
[im 58/155  lung]
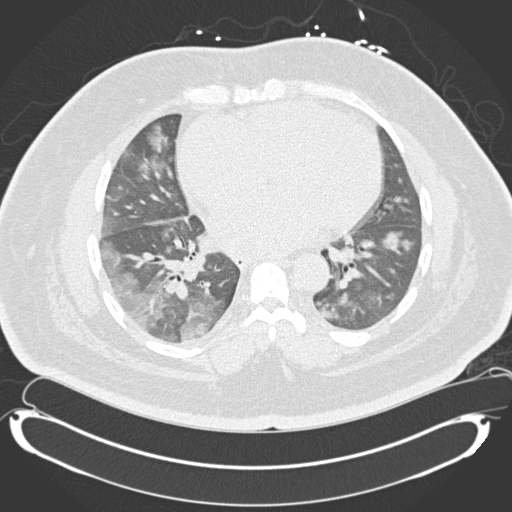
[im 69/155  lung]
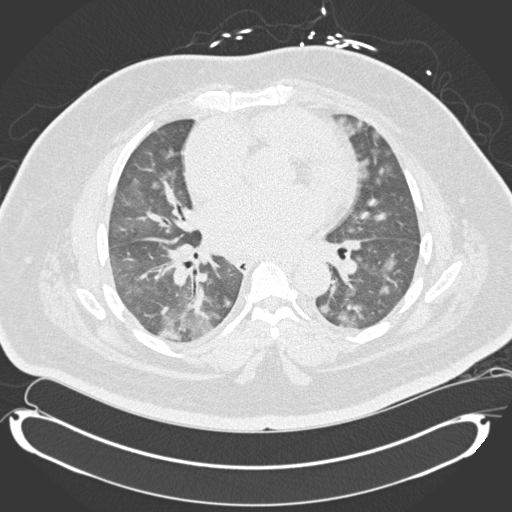
[im 86/155  lung]
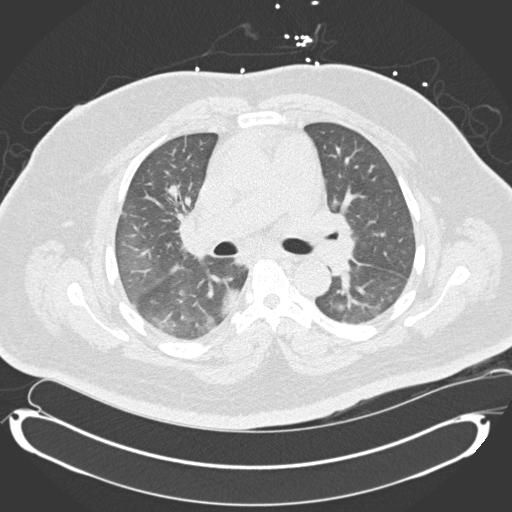
[im 97/155  lung]
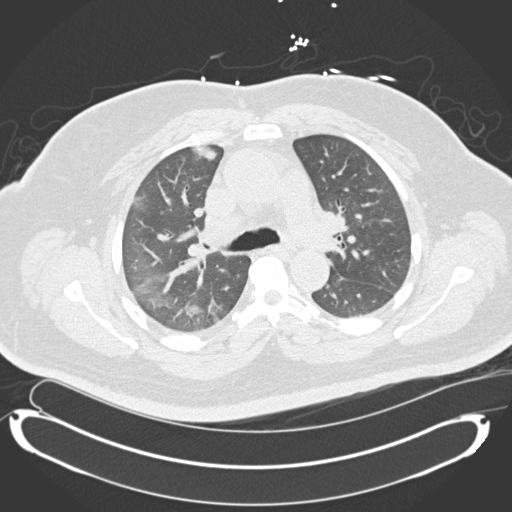
[im 109/155  mediastinal]
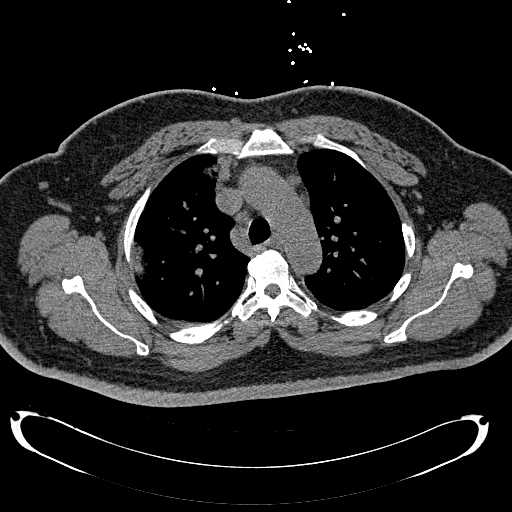
[im 109/155  lung]
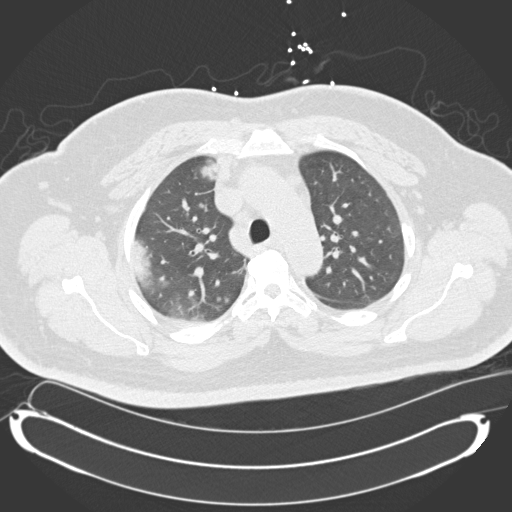
[im 120/155  lung]
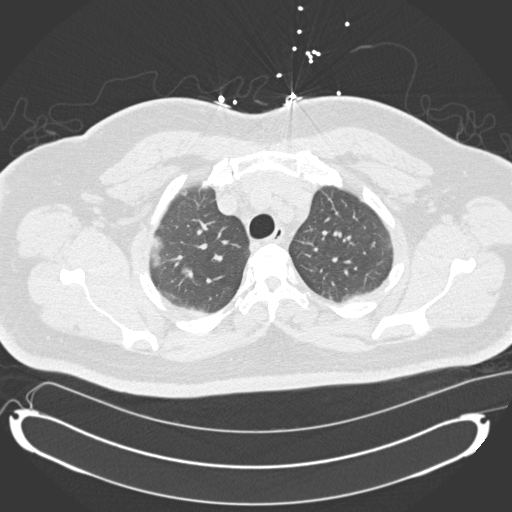
[im 132/155  lung]
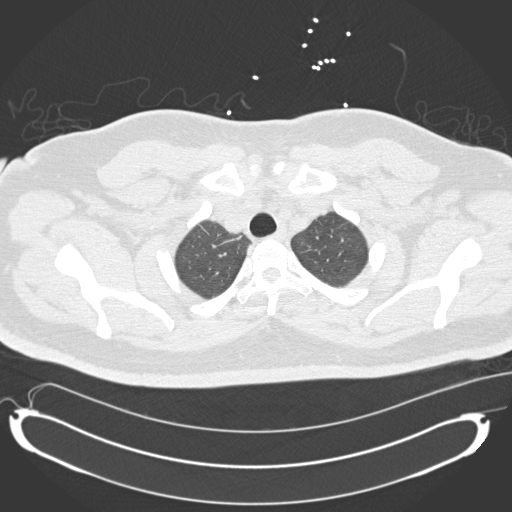
[im 143/155  lung]
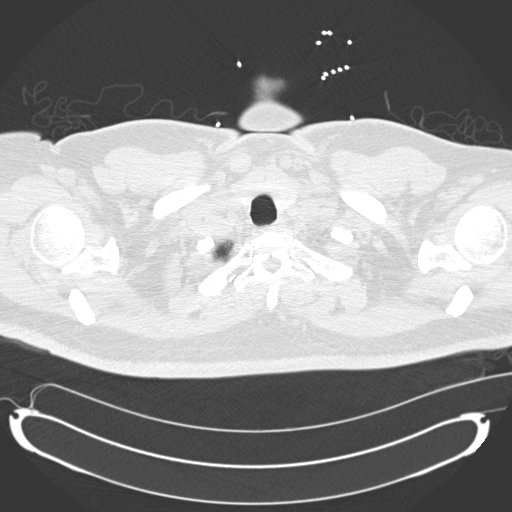

[Series 4: mpr coro 3mm · coronal · 0.66mm/px · 3 of 159 slices shown]
[im 32/159  lung]
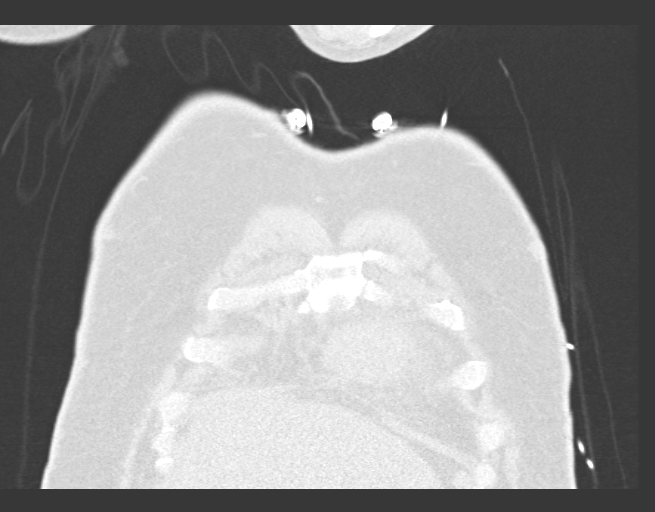
[im 64/159  lung]
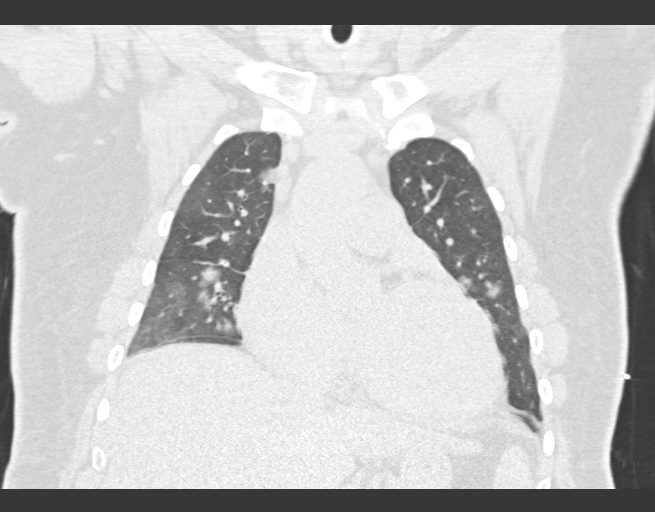
[im 95/159  lung]
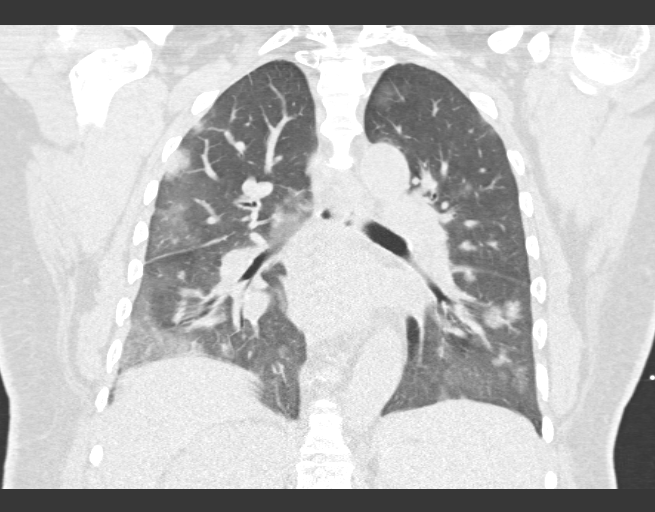

[15 of 36 positions shown; findings below may reference images not displayed]

FINDINGS: The lungs are well aerated bilaterally. Patchy sub solid infiltrates
are noted bilaterally worse on the right than the left. No sizable
effusion is seen.

The thoracic inlet is within normal limits. The thoracic aorta and
pulmonary artery as visualized are within normal limits although no
contrast was administered. No significant hilar or mediastinal
adenopathy is noted at this time. A few small scattered mediastinal
lymph nodes are noted which are not significant by size criteria.
The visualized upper abdomen is within normal limits. The bony
structures show no acute abnormality.
IMPRESSION: Diffuse patchy ground-glass infiltrates throughout both lungs worse
on the right than the left. Given the patient's clinical history of
lower extremity swelling this may represent some acute alveolar
edema although a an acute infectious etiology deserves consideration
as well. Followup examination is recommended following appropriate
therapy.

## 2017-07-12 IMAGING — DX DG CHEST 2V
2 series · 2 of 2 positions shown · non-contrast
Comparison: October 31, 2015

CLINICAL DATA: Shortness of Breath

EXAM:
CHEST  2 VIEW

[chest lat]
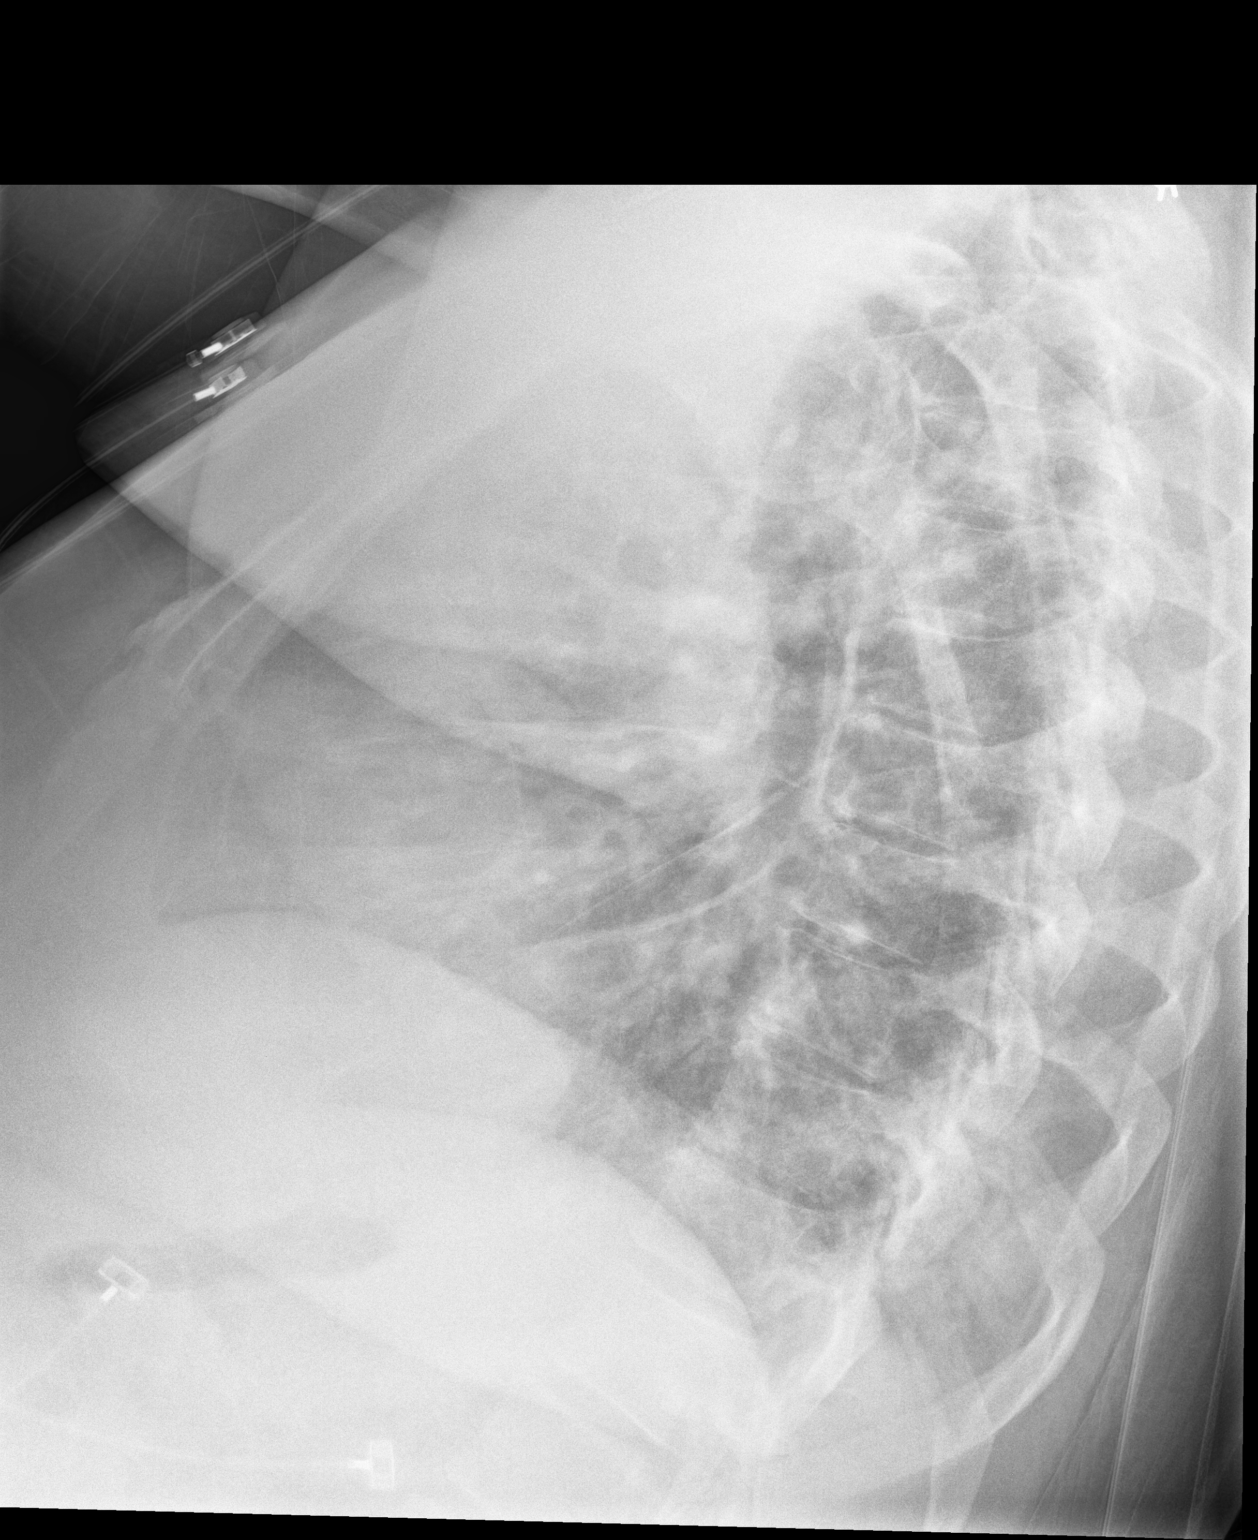

[chest ap]
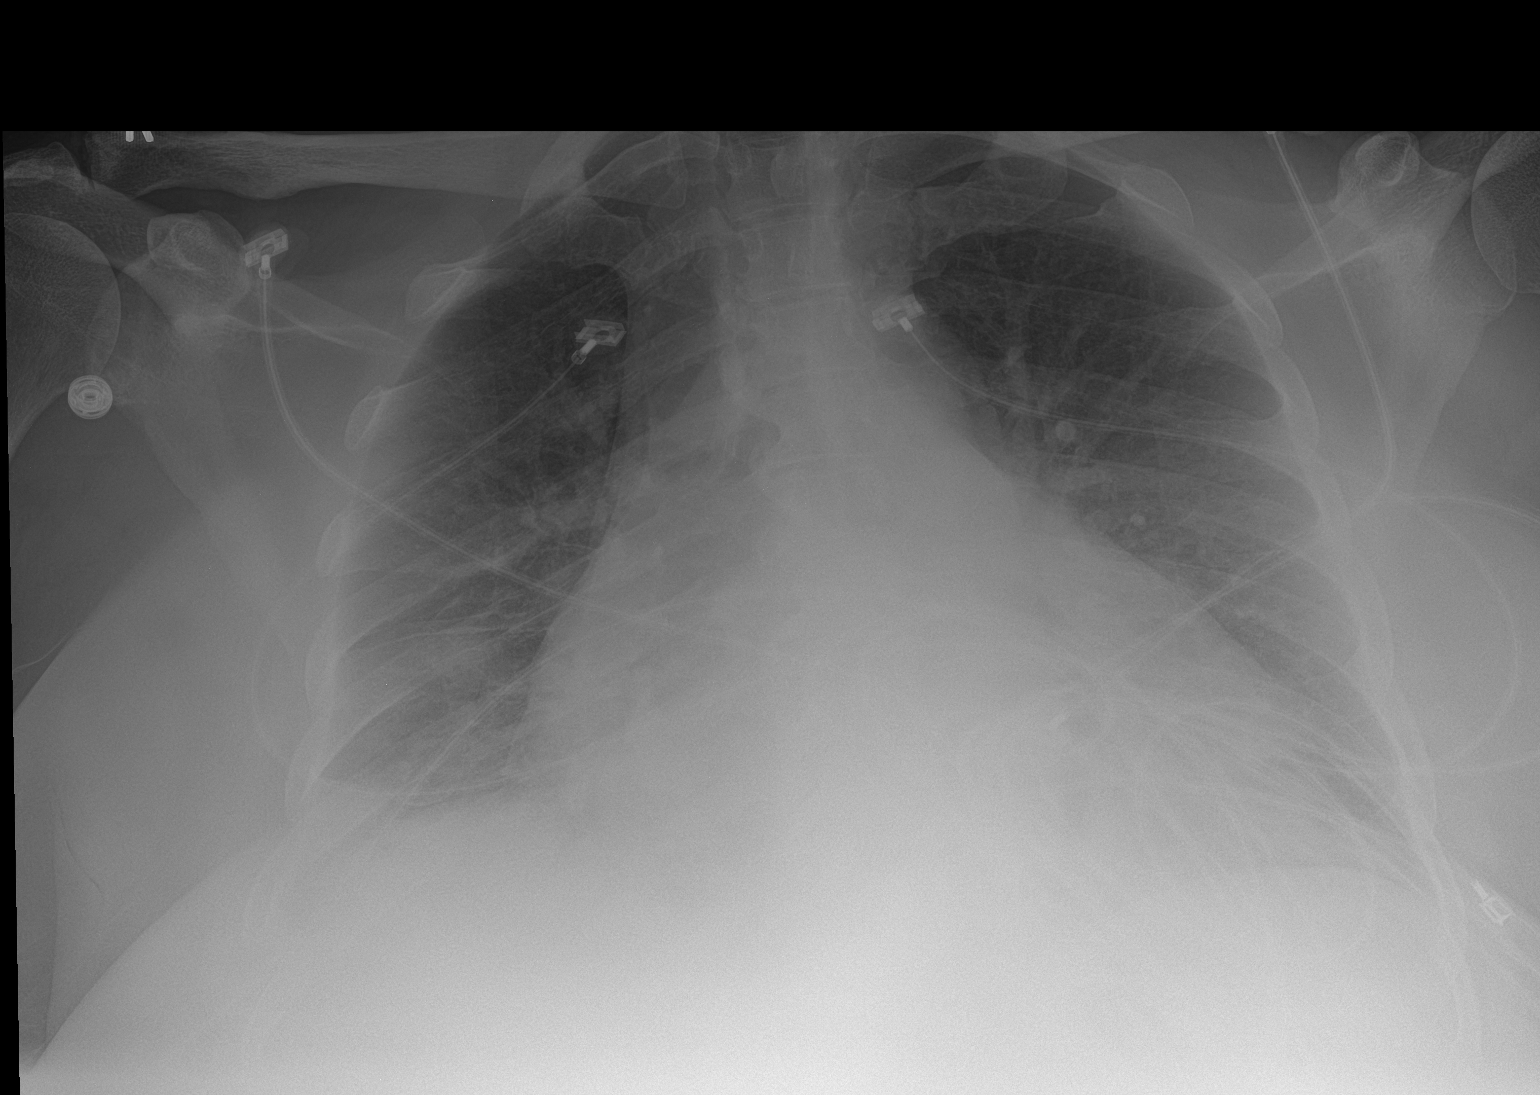

[2 of 2 positions shown; findings below may reference images not displayed]

FINDINGS: There is mild atelectatic change in the right lower lobe region.
Lungs elsewhere clear. There is cardiomegaly with pulmonary
vascularity within normal limits. No adenopathy. There is mild
degenerative change in the thoracic spine.
IMPRESSION: Mild right base atelectasis. Lungs elsewhere clear. Stable
cardiomegaly.

## 2017-07-24 IMAGING — DX DG CHEST 2V
2 series · 2 of 2 positions shown · non-contrast
Comparison: 11/05/2015

CLINICAL DATA: Chest pain and short of breath

EXAM:
CHEST  2 VIEW

[chest pa]
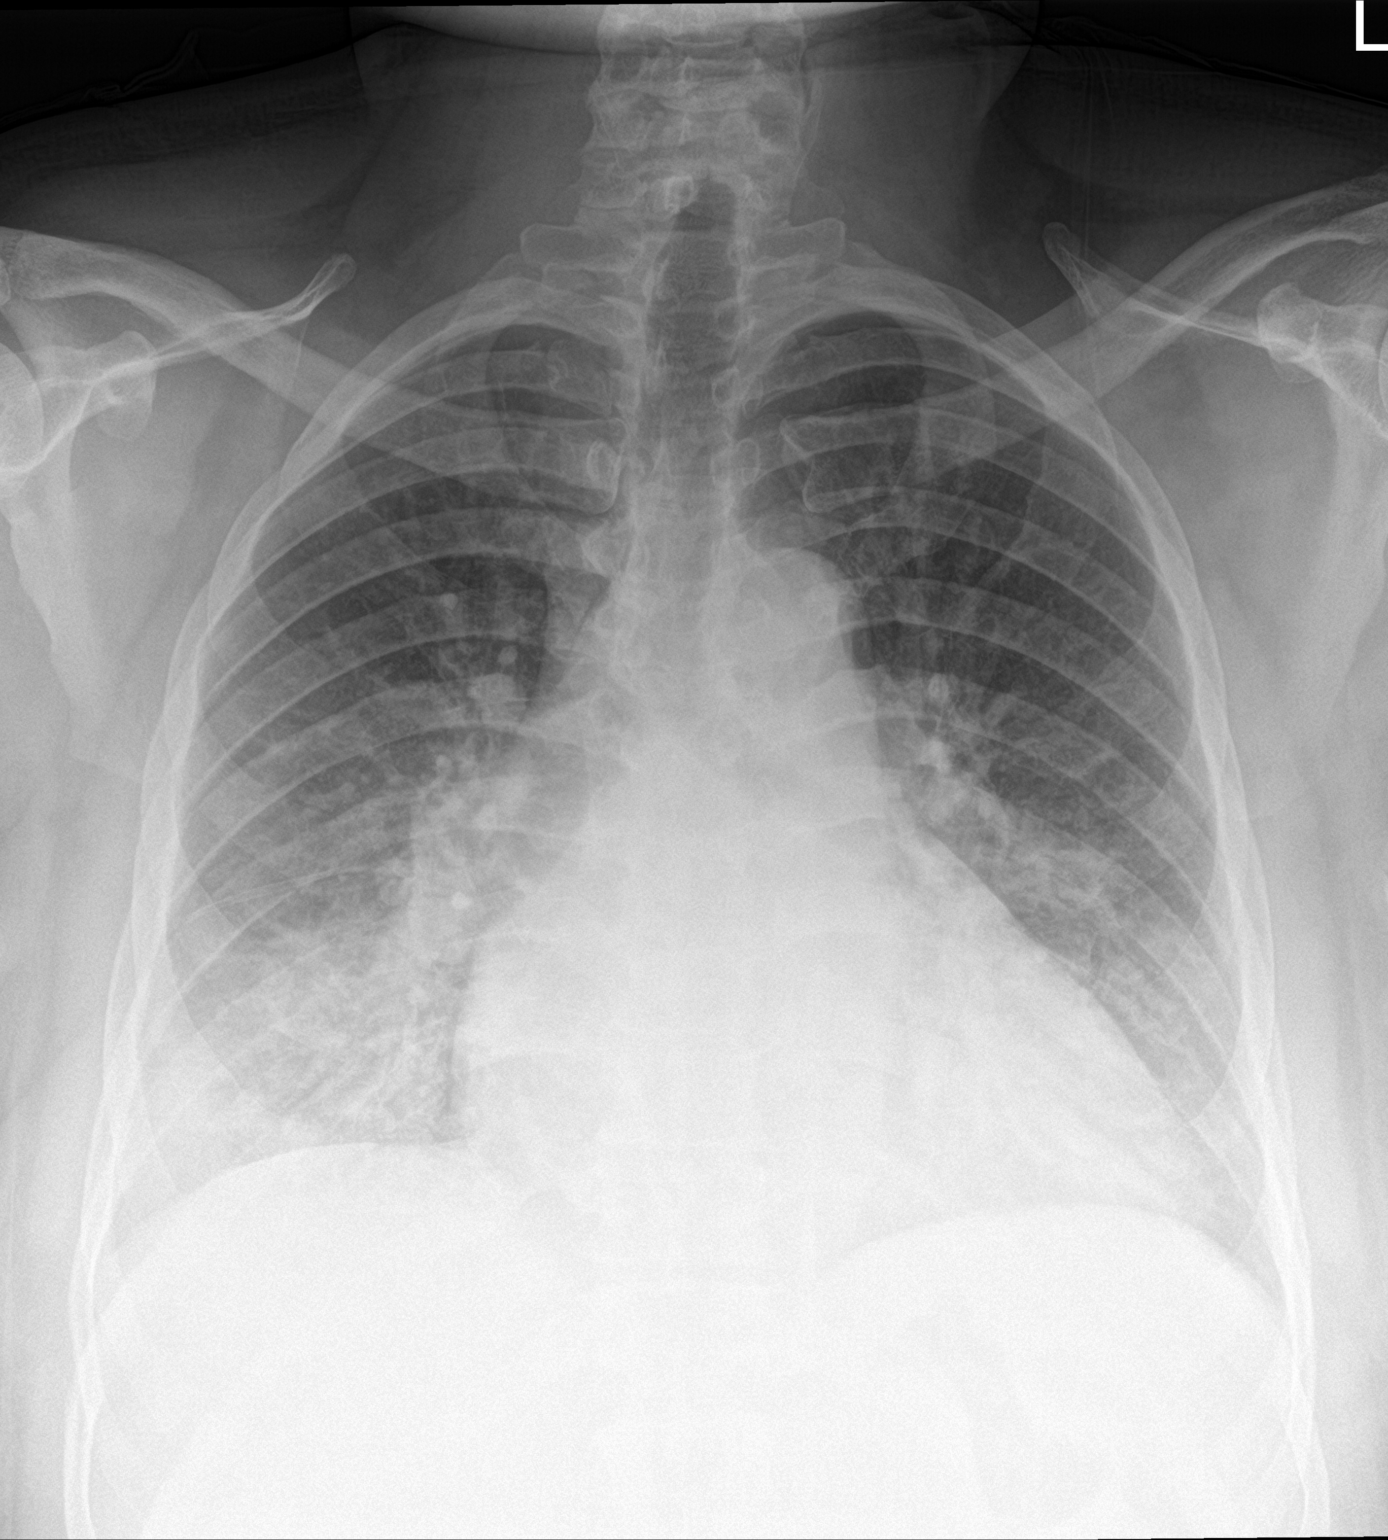

[chest lat]
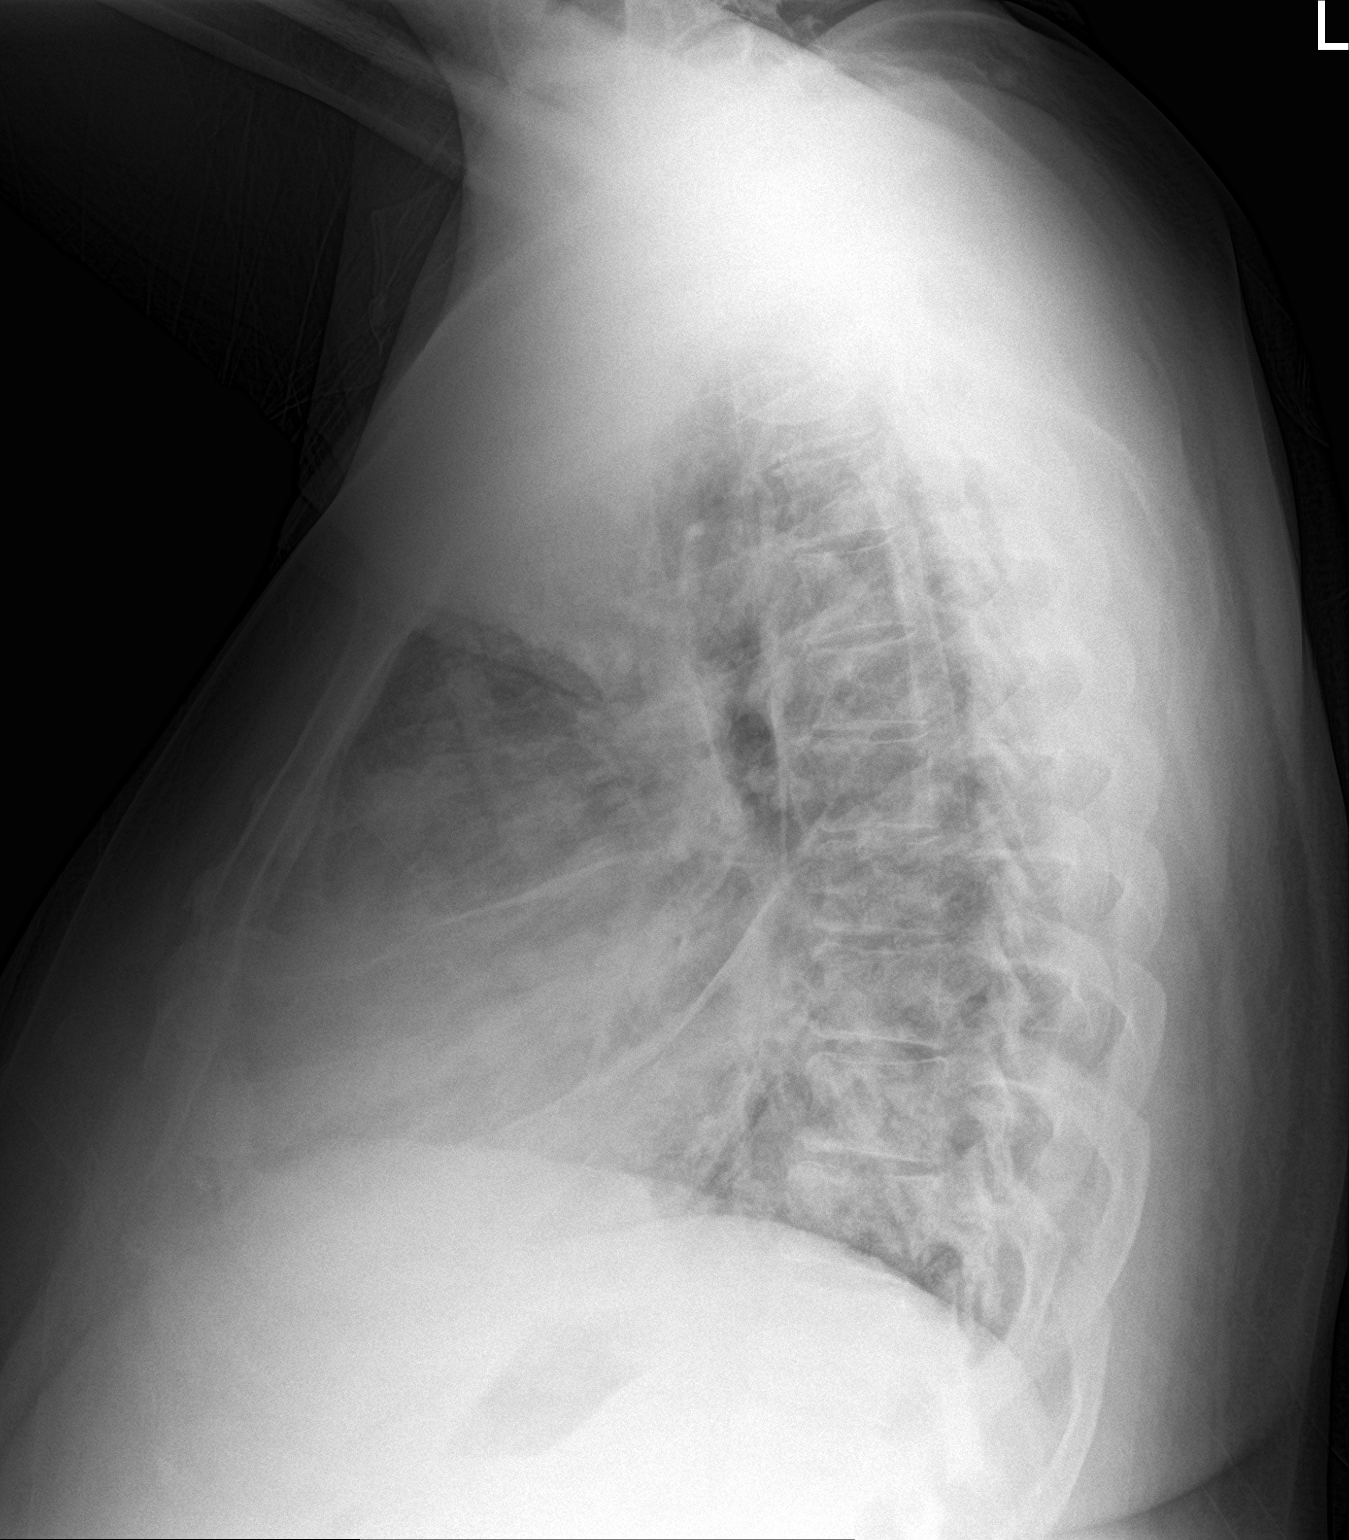

[2 of 2 positions shown; findings below may reference images not displayed]

FINDINGS: Cardiac enlargement with vascular congestion. Progression of
bibasilar airspace disease. Small amount of fluid in the fissure. No
layering pleural effusion.
IMPRESSION: Congestive heart failure with pulmonary edema which has progressed
in the interval. Progression of bibasilar airspace disease
compatible with edema/infiltrate.

## 2017-08-08 IMAGING — DX DG CHEST 2V
2 series · 2 of 2 positions shown · non-contrast
Comparison: 11/25/2015.

CLINICAL DATA: Increasing shortness of breath and lower extremity
swelling over 1 day. Previous smoker. History of CHF and asthma.

EXAM:
CHEST  2 VIEW

[chest pa]
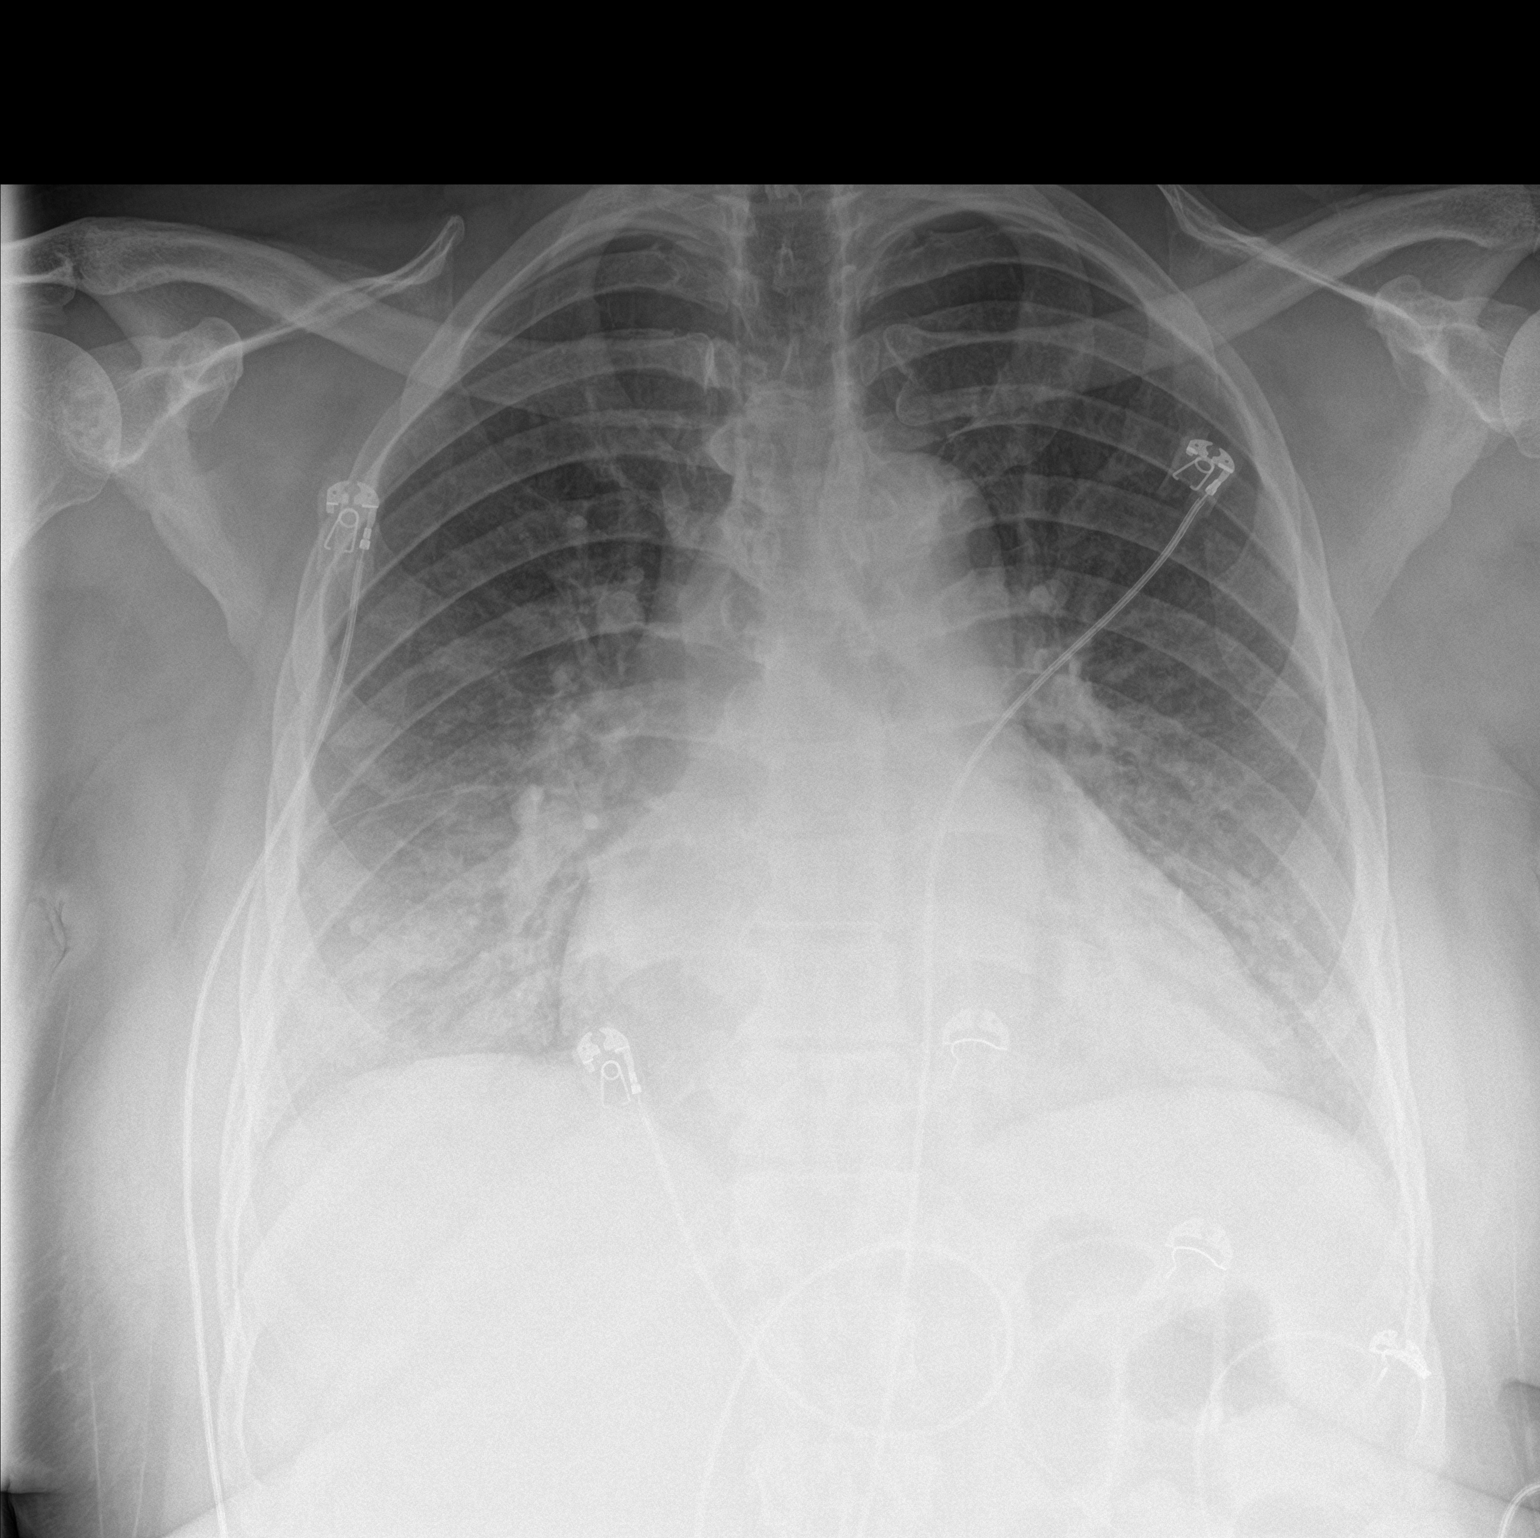

[chest lat]
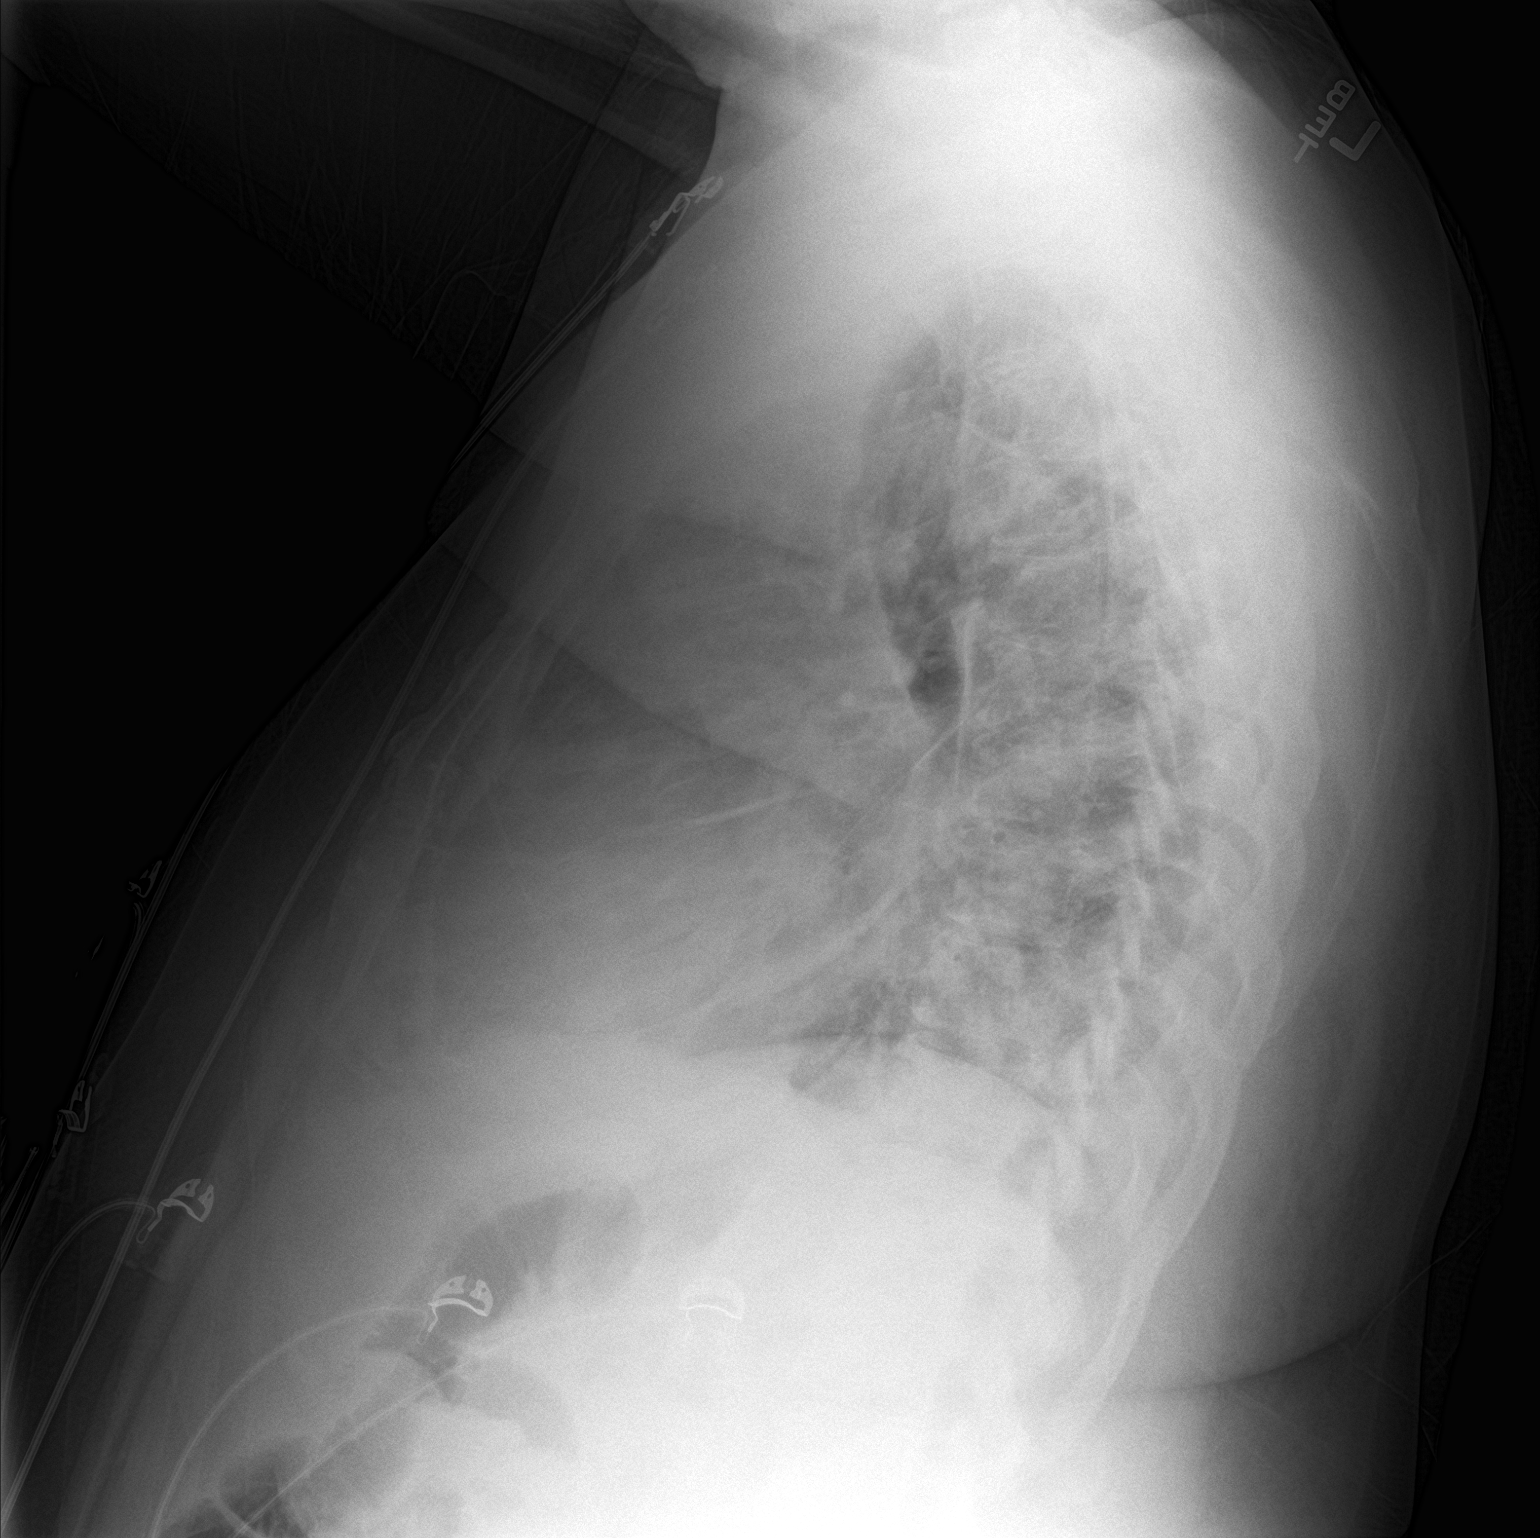

[2 of 2 positions shown; findings below may reference images not displayed]

FINDINGS: Cardiac enlargement. Suggestion of mild central vascular congestion.
Mild perihilar edema. Changes appear to be improving since previous
study. No blunting of costophrenic angles. No pneumothorax.
Mediastinal contours appear intact.
IMPRESSION: Improving congestive changes since previous study. Decreasing
perihilar edema.

## 2017-08-11 DIAGNOSIS — I5043 Acute on chronic combined systolic (congestive) and diastolic (congestive) heart failure: Secondary | ICD-10-CM | POA: Diagnosis not present

## 2017-08-21 IMAGING — CR DG CHEST 1V PORT
1 series · 1 of 1 positions shown · non-contrast
Comparison: December 15, 2015

CLINICAL DATA: Shortness of breath.  Chest pain.

EXAM:
PORTABLE CHEST 1 VIEW

[ap]
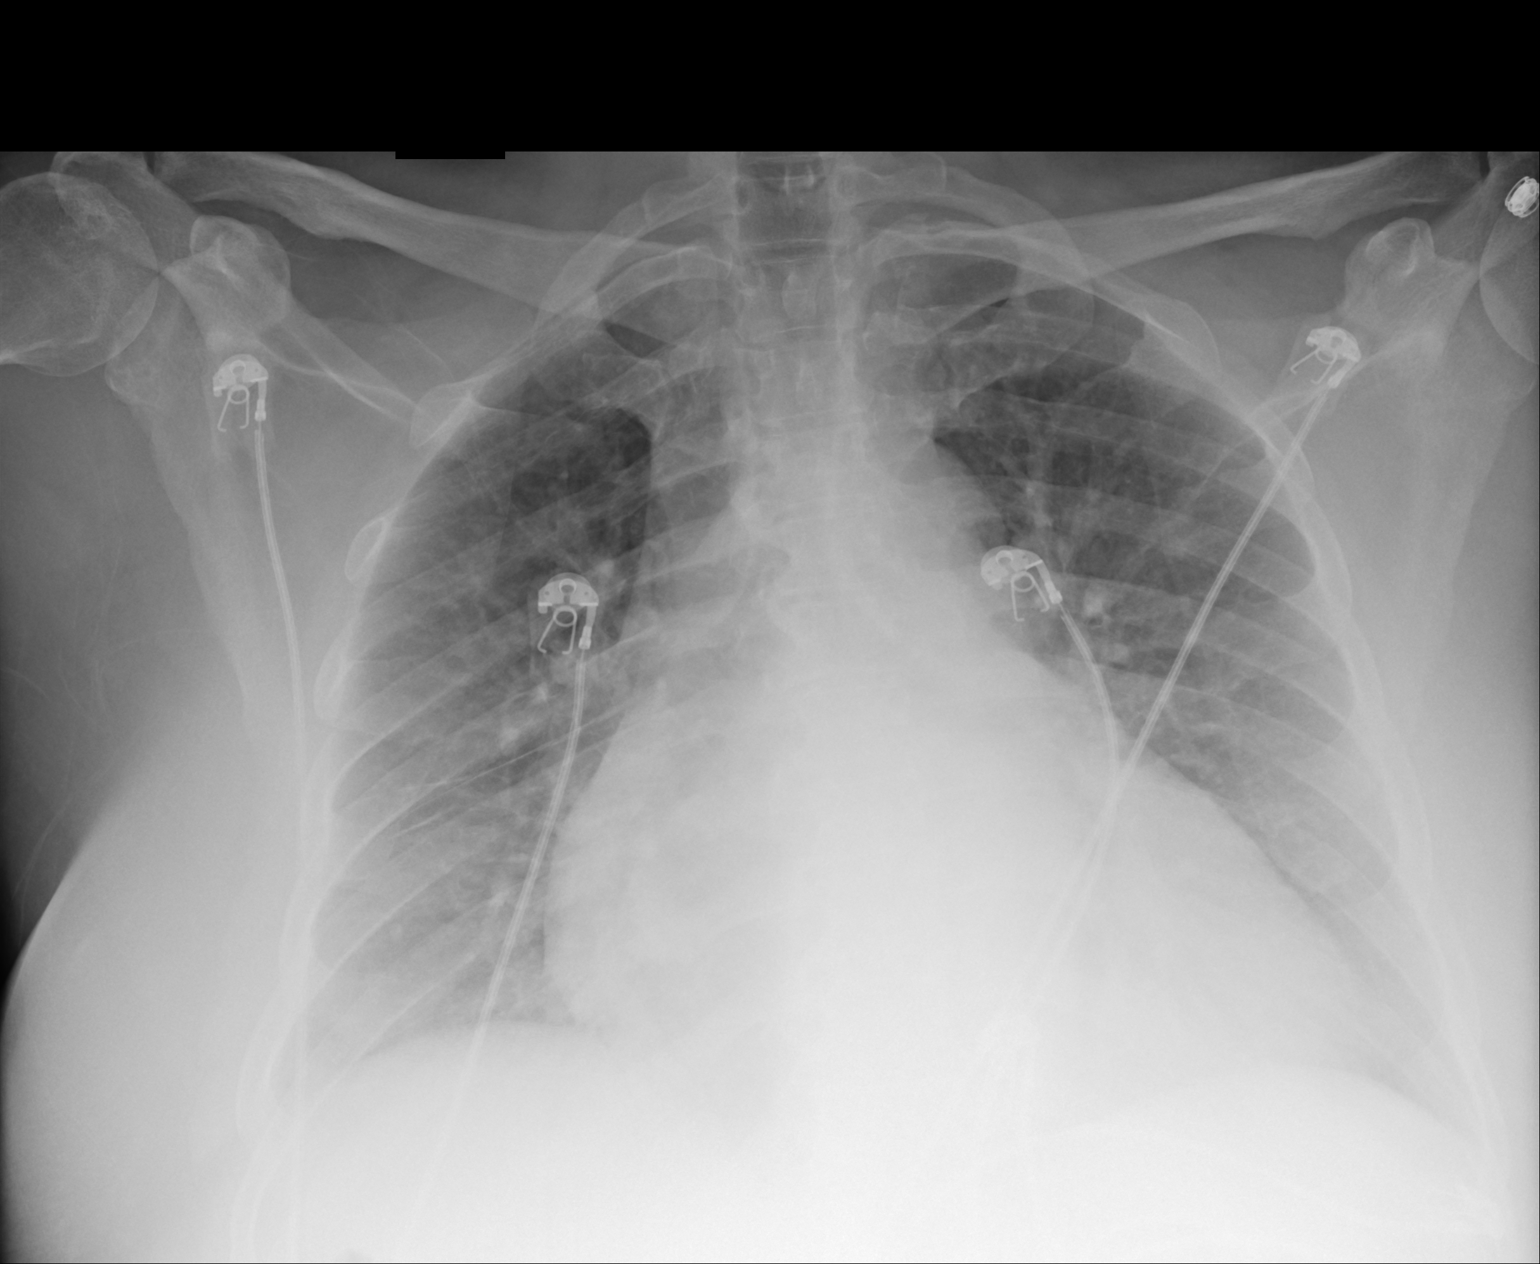

[1 of 1 positions shown; findings below may reference images not displayed]

FINDINGS: Cardiomegaly and pulmonary venous congestion with no overt edema,
nodule, or mass. The hila and mediastinum are unchanged.
IMPRESSION: Cardiomegaly and pulmonary venous congestion.

## 2017-08-21 IMAGING — DX DG CHEST 2V
2 series · 2 of 2 positions shown · non-contrast
Comparison: Chest radiograph from 12/02/2015

CLINICAL DATA: Acute onset of tachycardia and palpitations. Mid to
left-sided chest tightness and productive cough. Bilateral leg
swelling and shortness of breath. Initial encounter.

EXAM:
CHEST  2 VIEW

[chest pa]
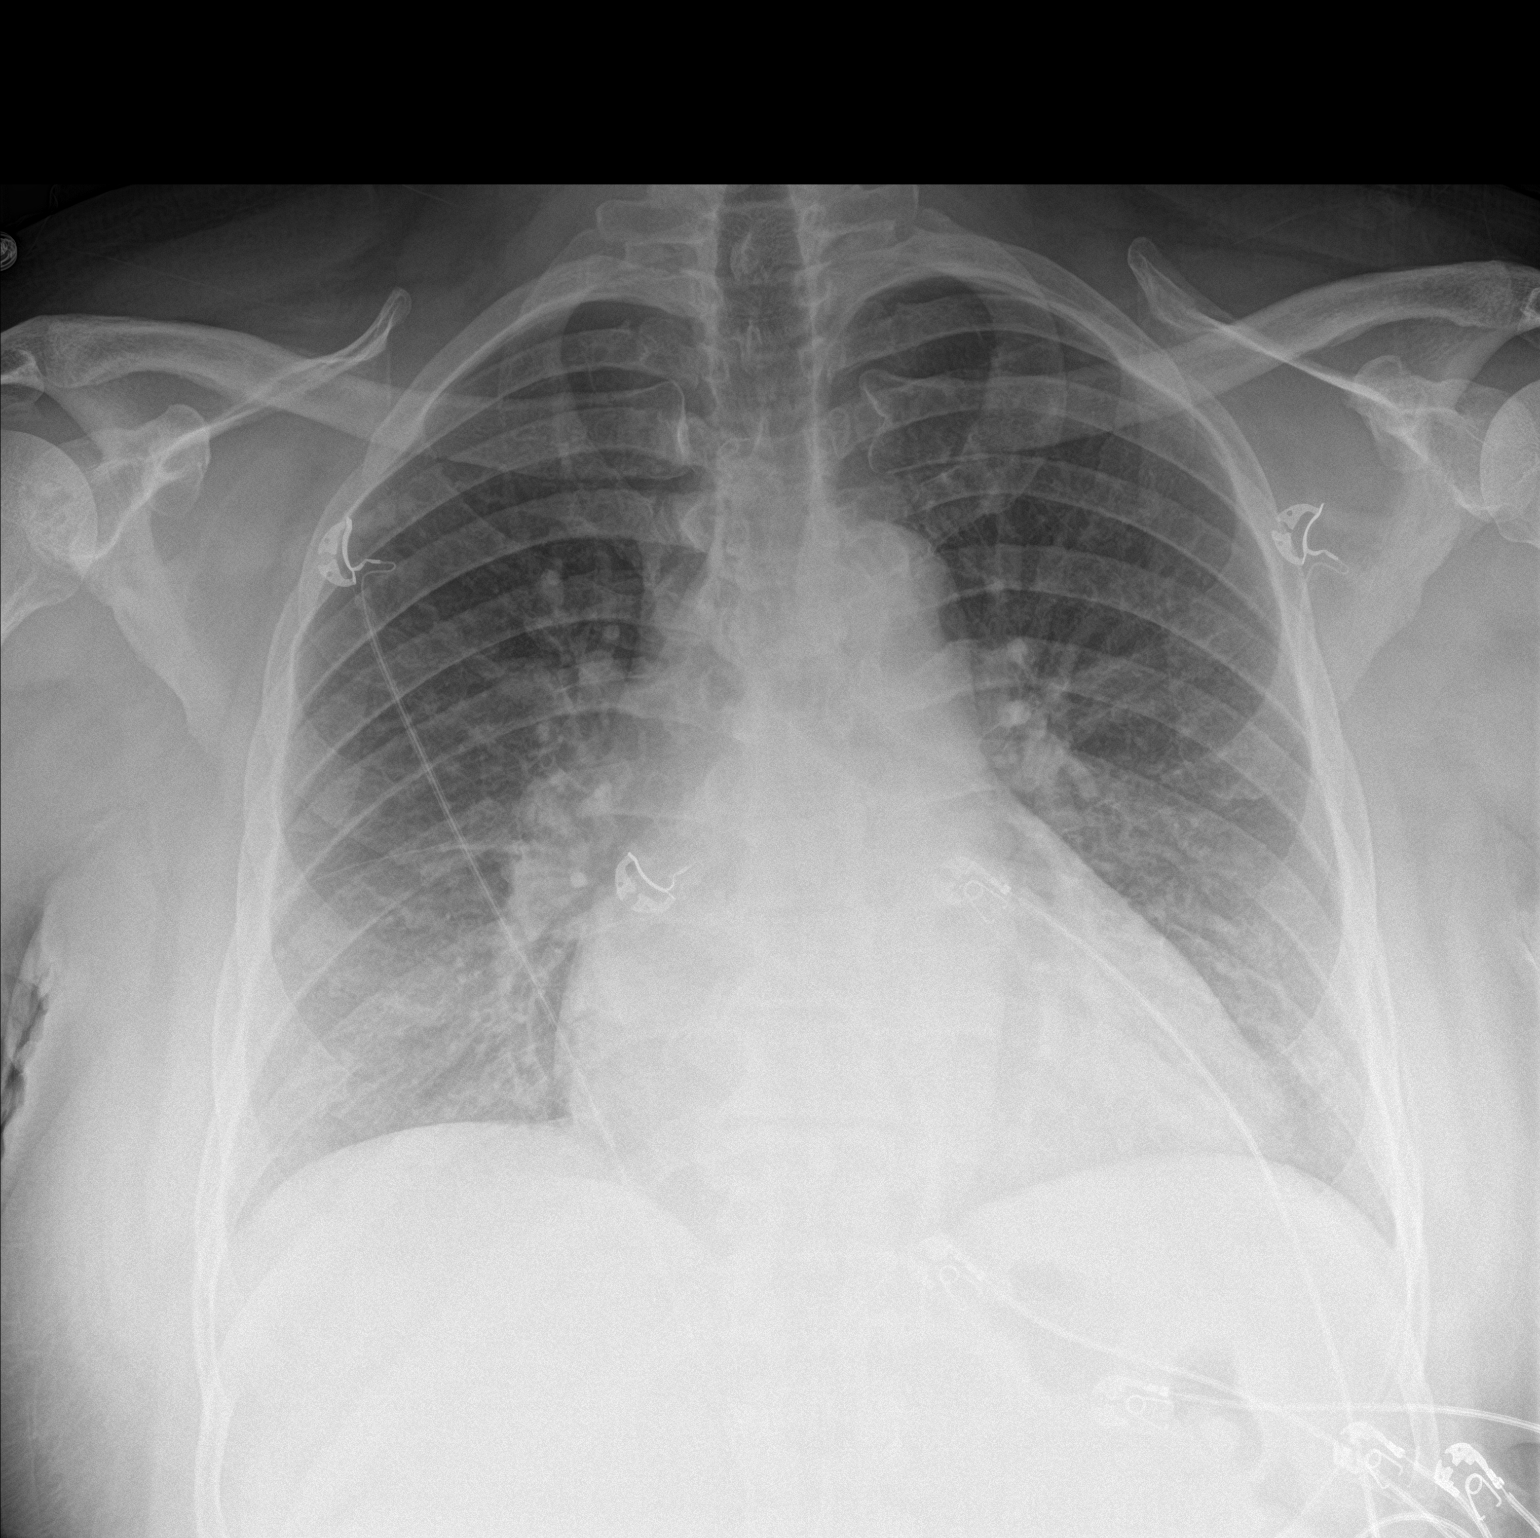

[chest lat]
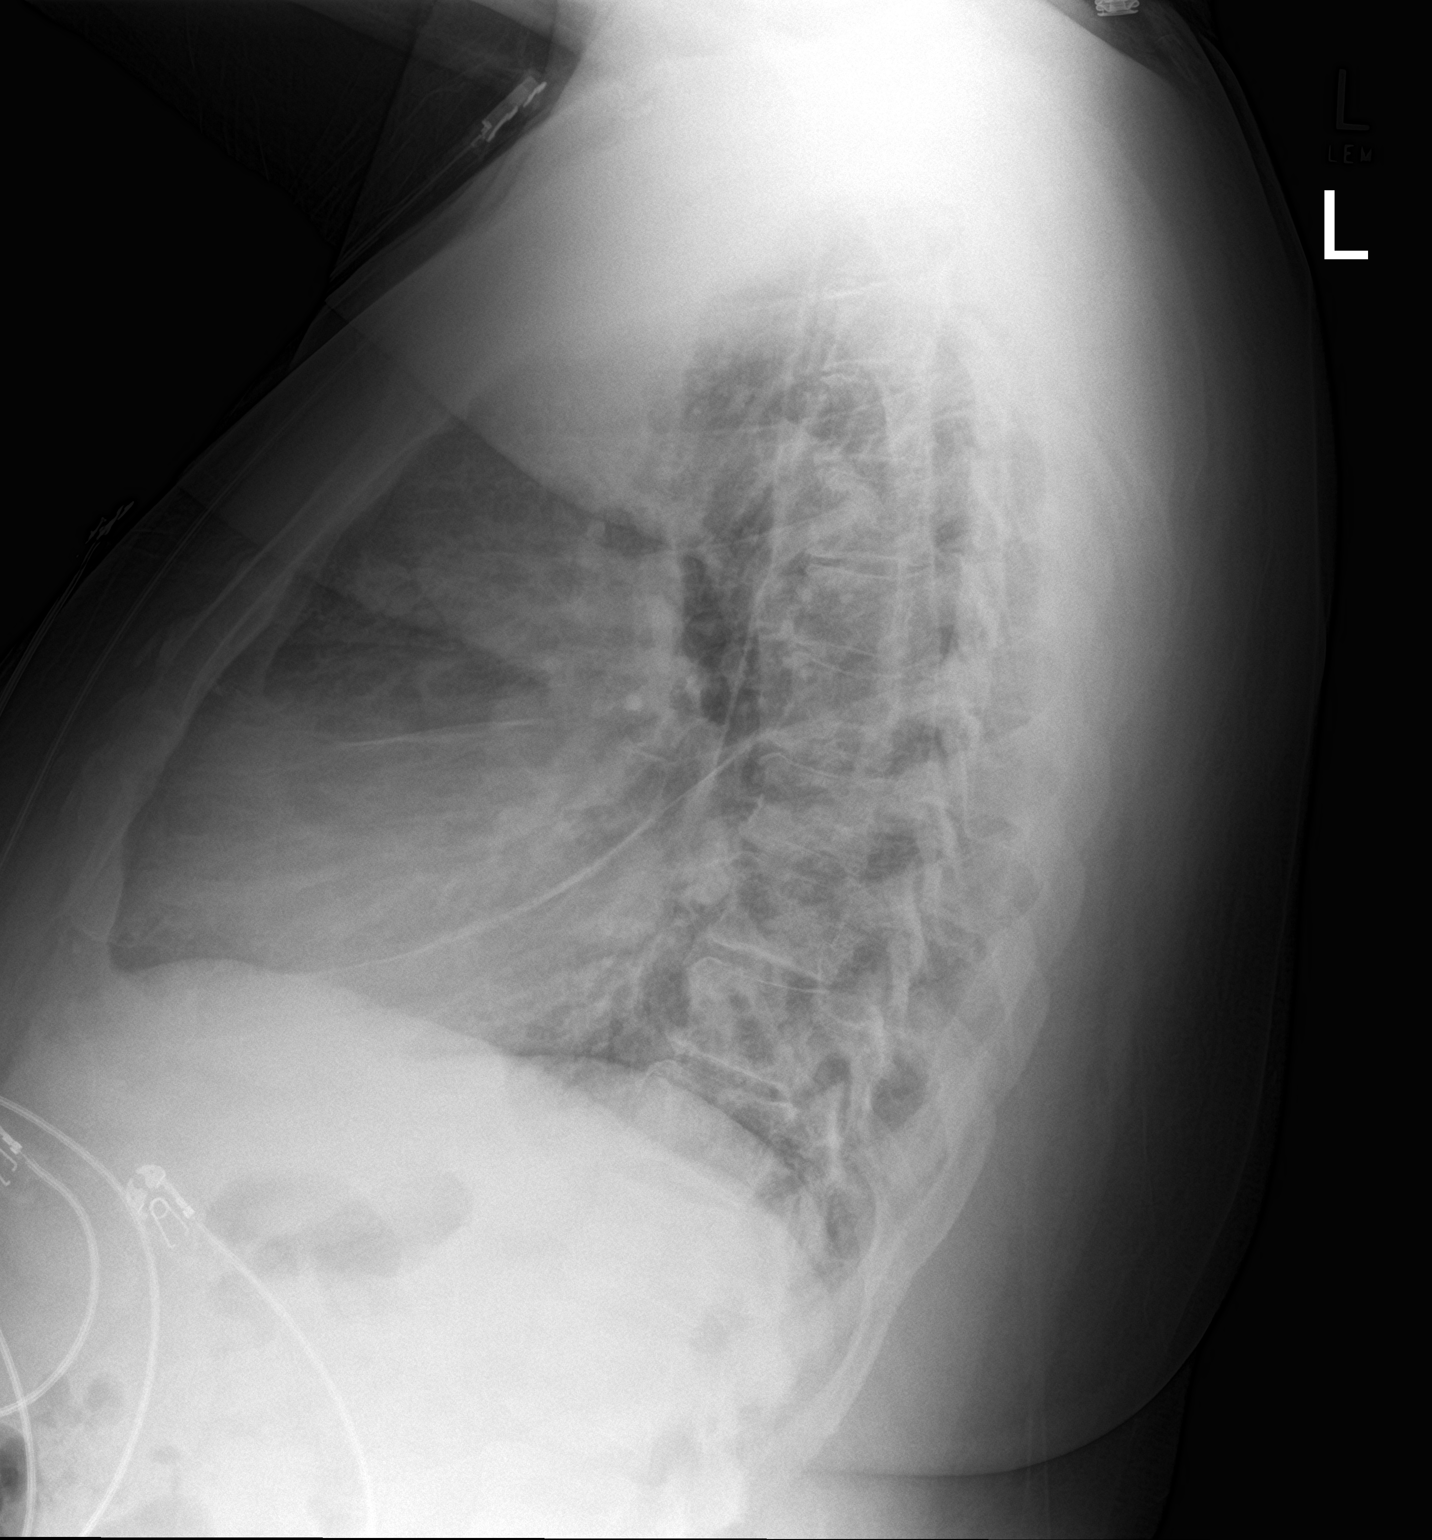

[2 of 2 positions shown; findings below may reference images not displayed]

FINDINGS: The lungs are well-aerated. Vascular congestion is noted. Increased
interstitial markings raise concern for mild pulmonary edema. There
is no evidence of pleural effusion or pneumothorax.

The heart is borderline enlarged. No acute osseous abnormalities are
seen.
IMPRESSION: Vascular congestion and borderline cardiomegaly. Increased
interstitial markings raise concern for mild pulmonary edema.

## 2017-08-27 IMAGING — DX DG CHEST 2V
2 series · 2 of 2 positions shown · non-contrast
Comparison: 12/15/2015

CLINICAL DATA: Shortness of Breath

EXAM:
CHEST  2 VIEW

[chest pa]
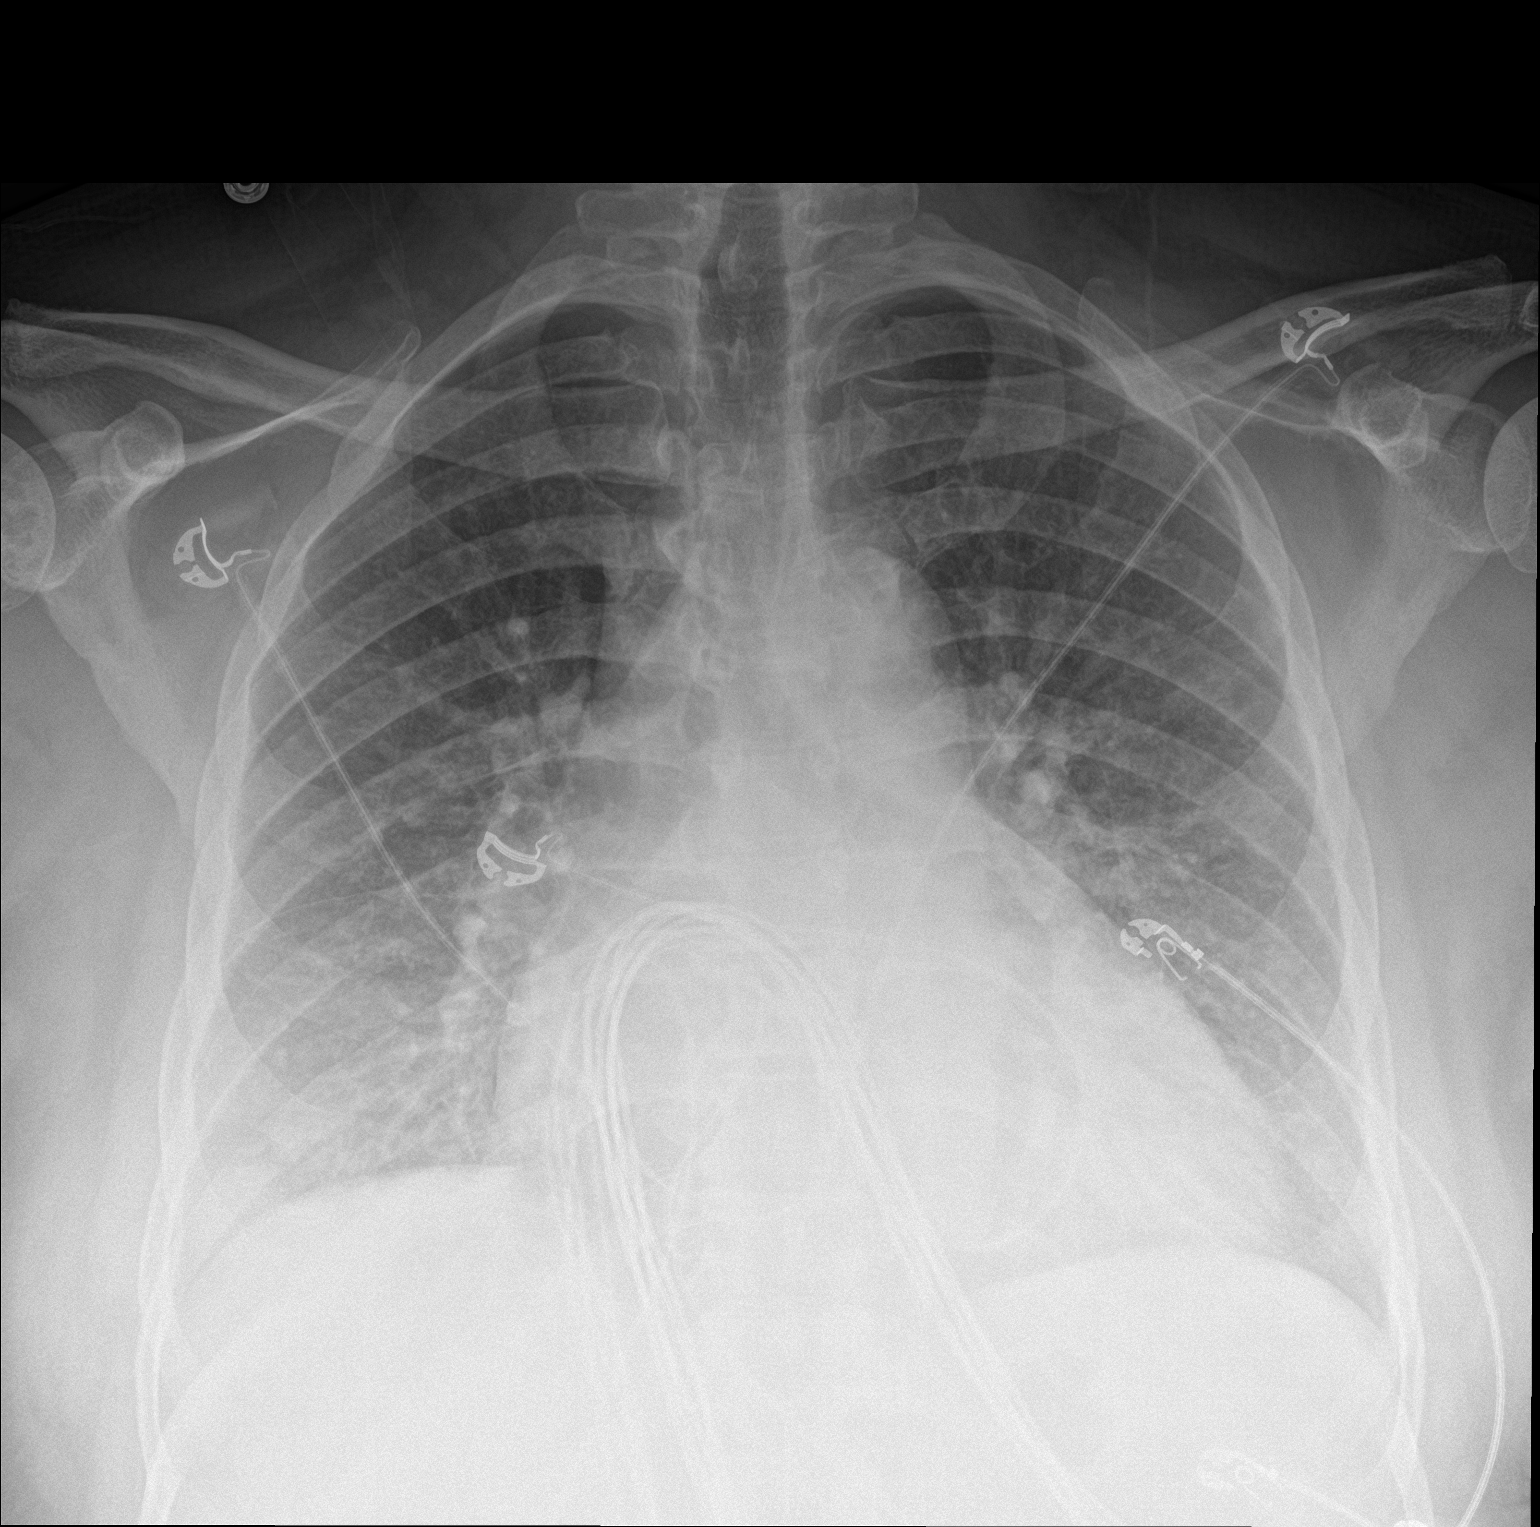

[chest lat]
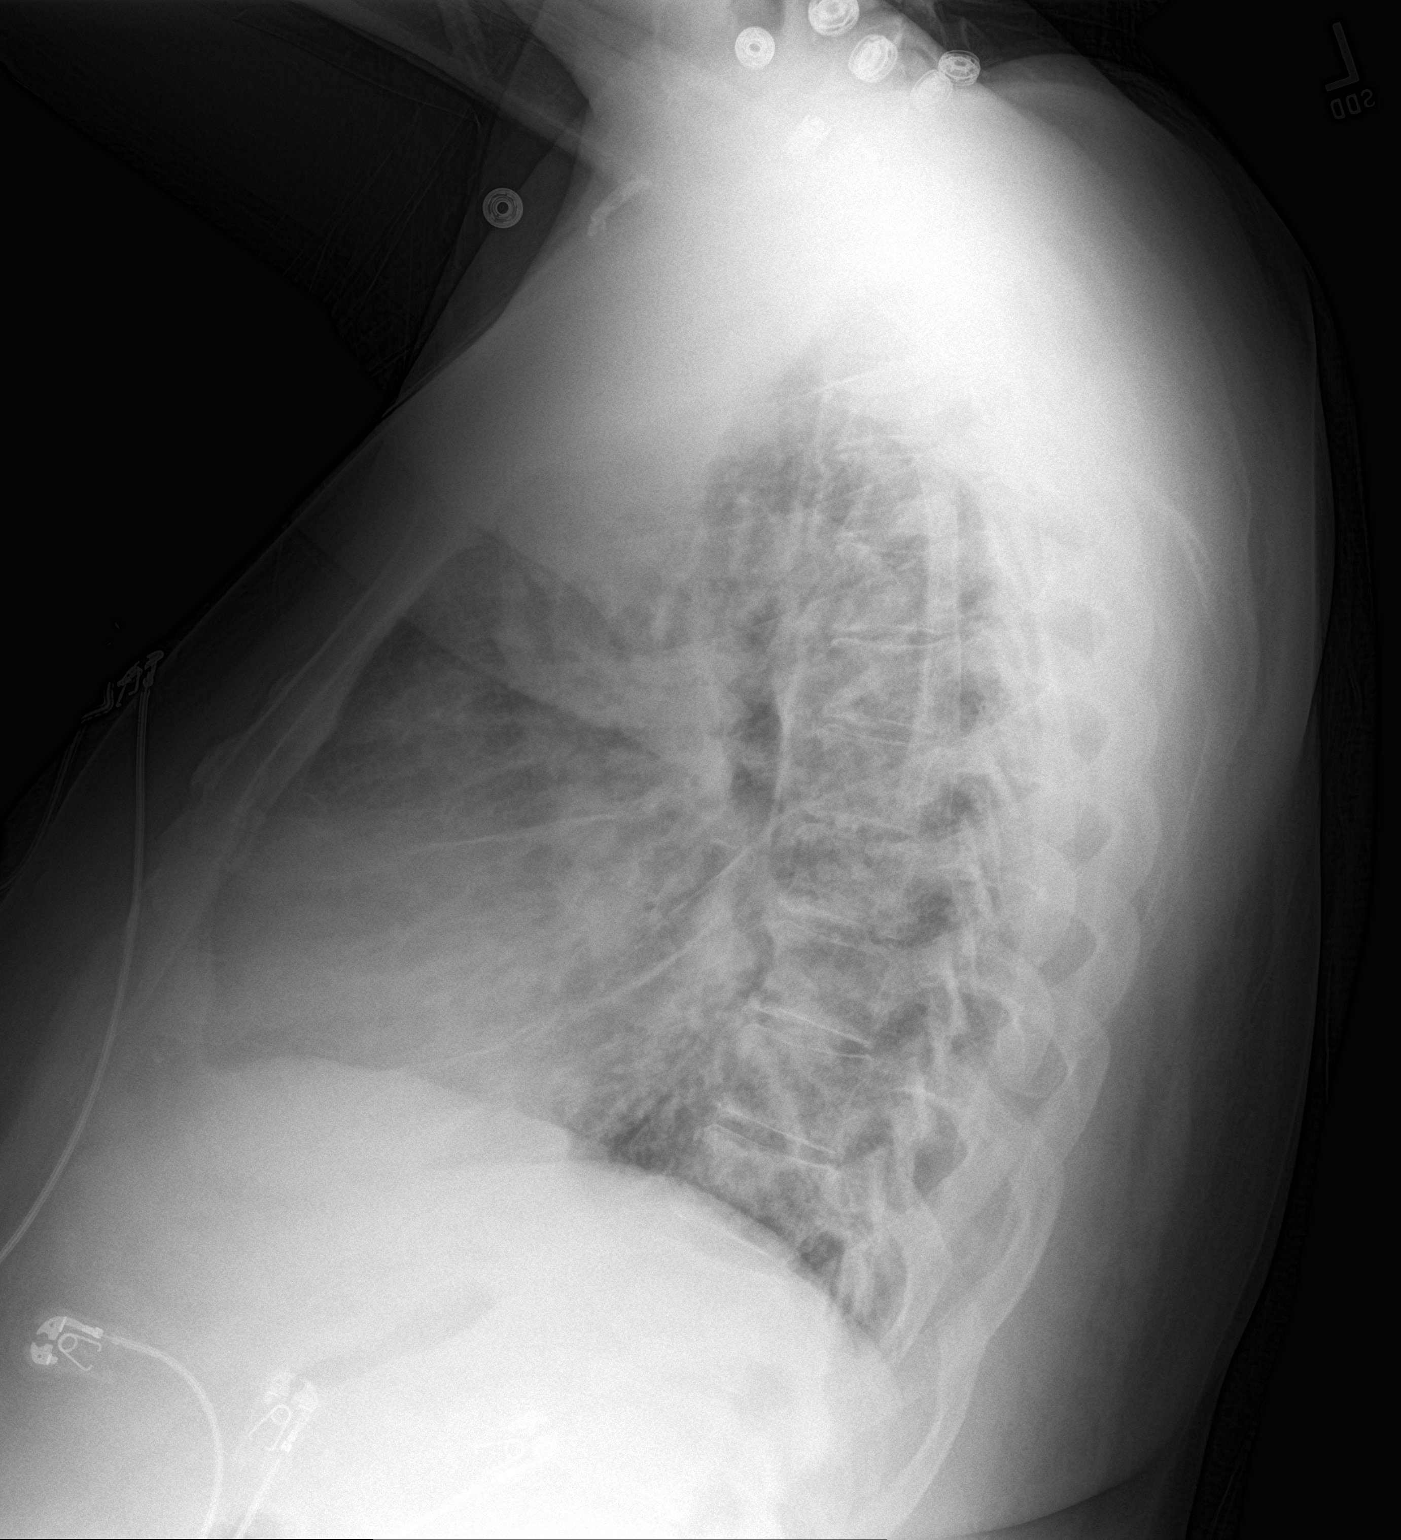

[2 of 2 positions shown; findings below may reference images not displayed]

FINDINGS: Cardiac shadow is enlarged. Mild vascular congestion is noted. No
focal infiltrate or sizable effusion is seen.
IMPRESSION: Mild CHF.

## 2017-09-03 DIAGNOSIS — I252 Old myocardial infarction: Secondary | ICD-10-CM | POA: Diagnosis not present

## 2017-09-03 DIAGNOSIS — I4891 Unspecified atrial fibrillation: Secondary | ICD-10-CM | POA: Diagnosis not present

## 2017-09-03 DIAGNOSIS — Z720 Tobacco use: Secondary | ICD-10-CM | POA: Diagnosis not present

## 2017-09-03 DIAGNOSIS — R0682 Tachypnea, not elsewhere classified: Secondary | ICD-10-CM | POA: Diagnosis not present

## 2017-09-03 DIAGNOSIS — Z87891 Personal history of nicotine dependence: Secondary | ICD-10-CM | POA: Diagnosis not present

## 2017-09-03 DIAGNOSIS — R0602 Shortness of breath: Secondary | ICD-10-CM | POA: Diagnosis not present

## 2017-09-03 DIAGNOSIS — K219 Gastro-esophageal reflux disease without esophagitis: Secondary | ICD-10-CM | POA: Diagnosis not present

## 2017-09-03 DIAGNOSIS — Z7951 Long term (current) use of inhaled steroids: Secondary | ICD-10-CM | POA: Diagnosis not present

## 2017-09-03 DIAGNOSIS — Z7901 Long term (current) use of anticoagulants: Secondary | ICD-10-CM | POA: Diagnosis not present

## 2017-09-03 DIAGNOSIS — Z79899 Other long term (current) drug therapy: Secondary | ICD-10-CM | POA: Diagnosis not present

## 2017-09-03 DIAGNOSIS — I110 Hypertensive heart disease with heart failure: Secondary | ICD-10-CM | POA: Diagnosis not present

## 2017-09-03 DIAGNOSIS — E7800 Pure hypercholesterolemia, unspecified: Secondary | ICD-10-CM | POA: Diagnosis not present

## 2017-09-03 DIAGNOSIS — I509 Heart failure, unspecified: Secondary | ICD-10-CM | POA: Diagnosis not present

## 2017-09-03 DIAGNOSIS — J449 Chronic obstructive pulmonary disease, unspecified: Secondary | ICD-10-CM | POA: Diagnosis not present

## 2017-09-03 DIAGNOSIS — I160 Hypertensive urgency: Secondary | ICD-10-CM | POA: Diagnosis not present

## 2017-09-03 DIAGNOSIS — R0600 Dyspnea, unspecified: Secondary | ICD-10-CM | POA: Diagnosis not present

## 2017-09-04 DIAGNOSIS — R0602 Shortness of breath: Secondary | ICD-10-CM | POA: Diagnosis not present

## 2017-09-07 ENCOUNTER — Other Ambulatory Visit: Payer: Self-pay

## 2017-09-07 ENCOUNTER — Encounter (HOSPITAL_COMMUNITY): Payer: Self-pay | Admitting: Emergency Medicine

## 2017-09-07 DIAGNOSIS — R600 Localized edema: Secondary | ICD-10-CM | POA: Insufficient documentation

## 2017-09-07 DIAGNOSIS — R030 Elevated blood-pressure reading, without diagnosis of hypertension: Secondary | ICD-10-CM | POA: Diagnosis not present

## 2017-09-07 DIAGNOSIS — I130 Hypertensive heart and chronic kidney disease with heart failure and stage 1 through stage 4 chronic kidney disease, or unspecified chronic kidney disease: Secondary | ICD-10-CM | POA: Insufficient documentation

## 2017-09-07 DIAGNOSIS — I5022 Chronic systolic (congestive) heart failure: Secondary | ICD-10-CM

## 2017-09-07 DIAGNOSIS — Z87891 Personal history of nicotine dependence: Secondary | ICD-10-CM | POA: Insufficient documentation

## 2017-09-07 DIAGNOSIS — Z7982 Long term (current) use of aspirin: Secondary | ICD-10-CM

## 2017-09-07 DIAGNOSIS — R609 Edema, unspecified: Secondary | ICD-10-CM | POA: Diagnosis not present

## 2017-09-07 DIAGNOSIS — R0602 Shortness of breath: Secondary | ICD-10-CM | POA: Diagnosis not present

## 2017-09-07 DIAGNOSIS — J45909 Unspecified asthma, uncomplicated: Secondary | ICD-10-CM | POA: Insufficient documentation

## 2017-09-07 DIAGNOSIS — Z7901 Long term (current) use of anticoagulants: Secondary | ICD-10-CM | POA: Insufficient documentation

## 2017-09-07 DIAGNOSIS — Z79899 Other long term (current) drug therapy: Secondary | ICD-10-CM

## 2017-09-07 DIAGNOSIS — I110 Hypertensive heart disease with heart failure: Secondary | ICD-10-CM | POA: Diagnosis not present

## 2017-09-07 DIAGNOSIS — N182 Chronic kidney disease, stage 2 (mild): Secondary | ICD-10-CM

## 2017-09-07 NOTE — ED Triage Notes (Signed)
Pt reports missing two days of fluid pills, took one dose today and has had some urine output. Lower extremity edema noted.

## 2017-09-08 ENCOUNTER — Emergency Department (HOSPITAL_COMMUNITY): Payer: Medicare Other

## 2017-09-08 ENCOUNTER — Other Ambulatory Visit: Payer: Self-pay

## 2017-09-08 ENCOUNTER — Emergency Department (HOSPITAL_COMMUNITY)
Admission: EM | Admit: 2017-09-08 | Discharge: 2017-09-08 | Disposition: A | Discharge status: Home / Self Care | Payer: Medicare Other | Source: Home / Self Care | Attending: Emergency Medicine | Admitting: Emergency Medicine

## 2017-09-08 ENCOUNTER — Inpatient Hospital Stay (HOSPITAL_COMMUNITY)
Admission: EM | Admit: 2017-09-08 | Discharge: 2017-09-11 | DRG: 291 | Disposition: A | Discharge status: Home / Self Care | Payer: Medicare Other | Attending: Internal Medicine | Admitting: Internal Medicine

## 2017-09-08 ENCOUNTER — Encounter (HOSPITAL_COMMUNITY): Payer: Self-pay | Admitting: Emergency Medicine

## 2017-09-08 DIAGNOSIS — I5043 Acute on chronic combined systolic (congestive) and diastolic (congestive) heart failure: Secondary | ICD-10-CM | POA: Diagnosis present

## 2017-09-08 DIAGNOSIS — I252 Old myocardial infarction: Secondary | ICD-10-CM | POA: Diagnosis not present

## 2017-09-08 DIAGNOSIS — R601 Generalized edema: Secondary | ICD-10-CM

## 2017-09-08 DIAGNOSIS — Z91018 Allergy to other foods: Secondary | ICD-10-CM

## 2017-09-08 DIAGNOSIS — E785 Hyperlipidemia, unspecified: Secondary | ICD-10-CM | POA: Diagnosis not present

## 2017-09-08 DIAGNOSIS — G4733 Obstructive sleep apnea (adult) (pediatric): Secondary | ICD-10-CM | POA: Diagnosis not present

## 2017-09-08 DIAGNOSIS — Z886 Allergy status to analgesic agent status: Secondary | ICD-10-CM

## 2017-09-08 DIAGNOSIS — K219 Gastro-esophageal reflux disease without esophagitis: Secondary | ICD-10-CM | POA: Diagnosis not present

## 2017-09-08 DIAGNOSIS — J9601 Acute respiratory failure with hypoxia: Secondary | ICD-10-CM | POA: Diagnosis not present

## 2017-09-08 DIAGNOSIS — I482 Chronic atrial fibrillation, unspecified: Secondary | ICD-10-CM | POA: Diagnosis present

## 2017-09-08 DIAGNOSIS — Z66 Do not resuscitate: Secondary | ICD-10-CM | POA: Diagnosis present

## 2017-09-08 DIAGNOSIS — M1990 Unspecified osteoarthritis, unspecified site: Secondary | ICD-10-CM | POA: Diagnosis not present

## 2017-09-08 DIAGNOSIS — Z7951 Long term (current) use of inhaled steroids: Secondary | ICD-10-CM | POA: Diagnosis not present

## 2017-09-08 DIAGNOSIS — R0602 Shortness of breath: Secondary | ICD-10-CM | POA: Diagnosis not present

## 2017-09-08 DIAGNOSIS — Z9111 Patient's noncompliance with dietary regimen: Secondary | ICD-10-CM

## 2017-09-08 DIAGNOSIS — I5022 Chronic systolic (congestive) heart failure: Secondary | ICD-10-CM | POA: Diagnosis not present

## 2017-09-08 DIAGNOSIS — I4891 Unspecified atrial fibrillation: Secondary | ICD-10-CM | POA: Diagnosis not present

## 2017-09-08 DIAGNOSIS — Z7982 Long term (current) use of aspirin: Secondary | ICD-10-CM | POA: Diagnosis not present

## 2017-09-08 DIAGNOSIS — D509 Iron deficiency anemia, unspecified: Secondary | ICD-10-CM | POA: Diagnosis present

## 2017-09-08 DIAGNOSIS — J45909 Unspecified asthma, uncomplicated: Secondary | ICD-10-CM | POA: Diagnosis not present

## 2017-09-08 DIAGNOSIS — Z87891 Personal history of nicotine dependence: Secondary | ICD-10-CM

## 2017-09-08 DIAGNOSIS — R609 Edema, unspecified: Secondary | ICD-10-CM

## 2017-09-08 DIAGNOSIS — T502X5A Adverse effect of carbonic-anhydrase inhibitors, benzothiadiazides and other diuretics, initial encounter: Secondary | ICD-10-CM | POA: Diagnosis not present

## 2017-09-08 DIAGNOSIS — R778 Other specified abnormalities of plasma proteins: Secondary | ICD-10-CM | POA: Diagnosis present

## 2017-09-08 DIAGNOSIS — R531 Weakness: Secondary | ICD-10-CM | POA: Diagnosis not present

## 2017-09-08 DIAGNOSIS — Z8249 Family history of ischemic heart disease and other diseases of the circulatory system: Secondary | ICD-10-CM | POA: Diagnosis not present

## 2017-09-08 DIAGNOSIS — Z9981 Dependence on supplemental oxygen: Secondary | ICD-10-CM | POA: Diagnosis not present

## 2017-09-08 DIAGNOSIS — I5023 Acute on chronic systolic (congestive) heart failure: Secondary | ICD-10-CM

## 2017-09-08 DIAGNOSIS — R7989 Other specified abnormal findings of blood chemistry: Secondary | ICD-10-CM | POA: Diagnosis present

## 2017-09-08 DIAGNOSIS — R079 Chest pain, unspecified: Secondary | ICD-10-CM | POA: Diagnosis not present

## 2017-09-08 DIAGNOSIS — I110 Hypertensive heart disease with heart failure: Principal | ICD-10-CM | POA: Diagnosis present

## 2017-09-08 DIAGNOSIS — R600 Localized edema: Secondary | ICD-10-CM | POA: Diagnosis not present

## 2017-09-08 DIAGNOSIS — I428 Other cardiomyopathies: Secondary | ICD-10-CM | POA: Diagnosis present

## 2017-09-08 DIAGNOSIS — Z91030 Bee allergy status: Secondary | ICD-10-CM

## 2017-09-08 DIAGNOSIS — E876 Hypokalemia: Secondary | ICD-10-CM | POA: Diagnosis not present

## 2017-09-08 DIAGNOSIS — Z888 Allergy status to other drugs, medicaments and biological substances status: Secondary | ICD-10-CM

## 2017-09-08 DIAGNOSIS — Z7901 Long term (current) use of anticoagulants: Secondary | ICD-10-CM

## 2017-09-08 DIAGNOSIS — I509 Heart failure, unspecified: Secondary | ICD-10-CM

## 2017-09-08 DIAGNOSIS — Z9114 Patient's other noncompliance with medication regimen: Secondary | ICD-10-CM

## 2017-09-08 DIAGNOSIS — Z6841 Body Mass Index (BMI) 40.0 and over, adult: Secondary | ICD-10-CM

## 2017-09-08 DIAGNOSIS — I10 Essential (primary) hypertension: Secondary | ICD-10-CM | POA: Diagnosis present

## 2017-09-08 LAB — BRAIN NATRIURETIC PEPTIDE: B Natriuretic Peptide: 233 pg/mL — ABNORMAL HIGH (ref 0.0–100.0)

## 2017-09-08 LAB — CBC WITH DIFFERENTIAL/PLATELET
BASOS ABS: 0 10*3/uL (ref 0.0–0.1)
BASOS ABS: 0 10*3/uL (ref 0.0–0.1)
Basophils Relative: 0 %
Basophils Relative: 0 %
EOS PCT: 2 %
EOS PCT: 2 %
Eosinophils Absolute: 0.1 10*3/uL (ref 0.0–0.7)
Eosinophils Absolute: 0.1 10*3/uL (ref 0.0–0.7)
HEMATOCRIT: 40.4 % (ref 39.0–52.0)
HEMATOCRIT: 41.9 % (ref 39.0–52.0)
HEMOGLOBIN: 13.2 g/dL (ref 13.0–17.0)
Hemoglobin: 12.9 g/dL — ABNORMAL LOW (ref 13.0–17.0)
LYMPHS ABS: 0.8 10*3/uL (ref 0.7–4.0)
LYMPHS PCT: 17 %
Lymphocytes Relative: 20 %
Lymphs Abs: 1 10*3/uL (ref 0.7–4.0)
MCH: 27.8 pg (ref 26.0–34.0)
MCH: 27.8 pg (ref 26.0–34.0)
MCHC: 31.9 g/dL (ref 30.0–36.0)
MCHC: 31.5 g/dL (ref 30.0–36.0)
MCV: 87.1 fL (ref 78.0–100.0)
MCV: 88.4 fL (ref 78.0–100.0)
MONO ABS: 0.4 10*3/uL (ref 0.1–1.0)
MONOS PCT: 7 %
Monocytes Absolute: 0.6 10*3/uL (ref 0.1–1.0)
Monocytes Relative: 12 %
NEUTROS ABS: 3.7 10*3/uL (ref 1.7–7.7)
NEUTROS ABS: 3.2 10*3/uL (ref 1.7–7.7)
NEUTROS PCT: 69 %
Neutrophils Relative %: 71 %
PLATELETS: 223 10*3/uL (ref 150–400)
PLATELETS: 205 10*3/uL (ref 150–400)
RBC: 4.64 MIL/uL (ref 4.22–5.81)
RBC: 4.74 MIL/uL (ref 4.22–5.81)
RDW: 15.2 % (ref 11.5–15.5)
RDW: 15.2 % (ref 11.5–15.5)
WBC: 5.3 10*3/uL (ref 4.0–10.5)
WBC: 4.7 10*3/uL (ref 4.0–10.5)

## 2017-09-08 LAB — BASIC METABOLIC PANEL
ANION GAP: 12 (ref 5–15)
BUN: 12 mg/dL (ref 6–20)
CO2: 27 mmol/L (ref 22–32)
Calcium: 8.7 mg/dL — ABNORMAL LOW (ref 8.9–10.3)
Chloride: 102 mmol/L (ref 101–111)
Creatinine, Ser: 1.1 mg/dL (ref 0.61–1.24)
GFR calc Af Amer: >60 mL/min (ref >60)
GLUCOSE: 97 mg/dL (ref 65–99)
Potassium: 3.5 mmol/L (ref 3.5–5.1)
Sodium: 141 mmol/L (ref 135–145)

## 2017-09-08 LAB — COMPREHENSIVE METABOLIC PANEL
ALT: 18 U/L (ref 17–63)
ANION GAP: 12 (ref 5–15)
AST: 25 U/L (ref 15–41)
Albumin: 3.7 g/dL (ref 3.5–5.0)
Alkaline Phosphatase: 78 U/L (ref 38–126)
BILIRUBIN TOTAL: 1.3 mg/dL — AB (ref 0.3–1.2)
BUN: 13 mg/dL (ref 6–20)
CO2: 28 mmol/L (ref 22–32)
Calcium: 8.5 mg/dL — ABNORMAL LOW (ref 8.9–10.3)
Chloride: 100 mmol/L — ABNORMAL LOW (ref 101–111)
Creatinine, Ser: 1.1 mg/dL (ref 0.61–1.24)
Glucose, Bld: 90 mg/dL (ref 65–99)
POTASSIUM: 3.1 mmol/L — AB (ref 3.5–5.1)
Sodium: 140 mmol/L (ref 135–145)
TOTAL PROTEIN: 7.2 g/dL (ref 6.5–8.1)

## 2017-09-08 LAB — TROPONIN I
TROPONIN I: 0.04 ng/mL — AB (ref <0.03)
Troponin I: 0.04 ng/mL (ref <0.03)
Troponin I: 0.04 ng/mL (ref <0.03)

## 2017-09-08 IMAGING — DX DG CHEST 2V
2 series · 2 of 2 positions shown · non-contrast
Comparison: 01/02/2016

CLINICAL DATA: Shortness of breath for 2 weeks

EXAM:
CHEST  2 VIEW

[w chest lat]
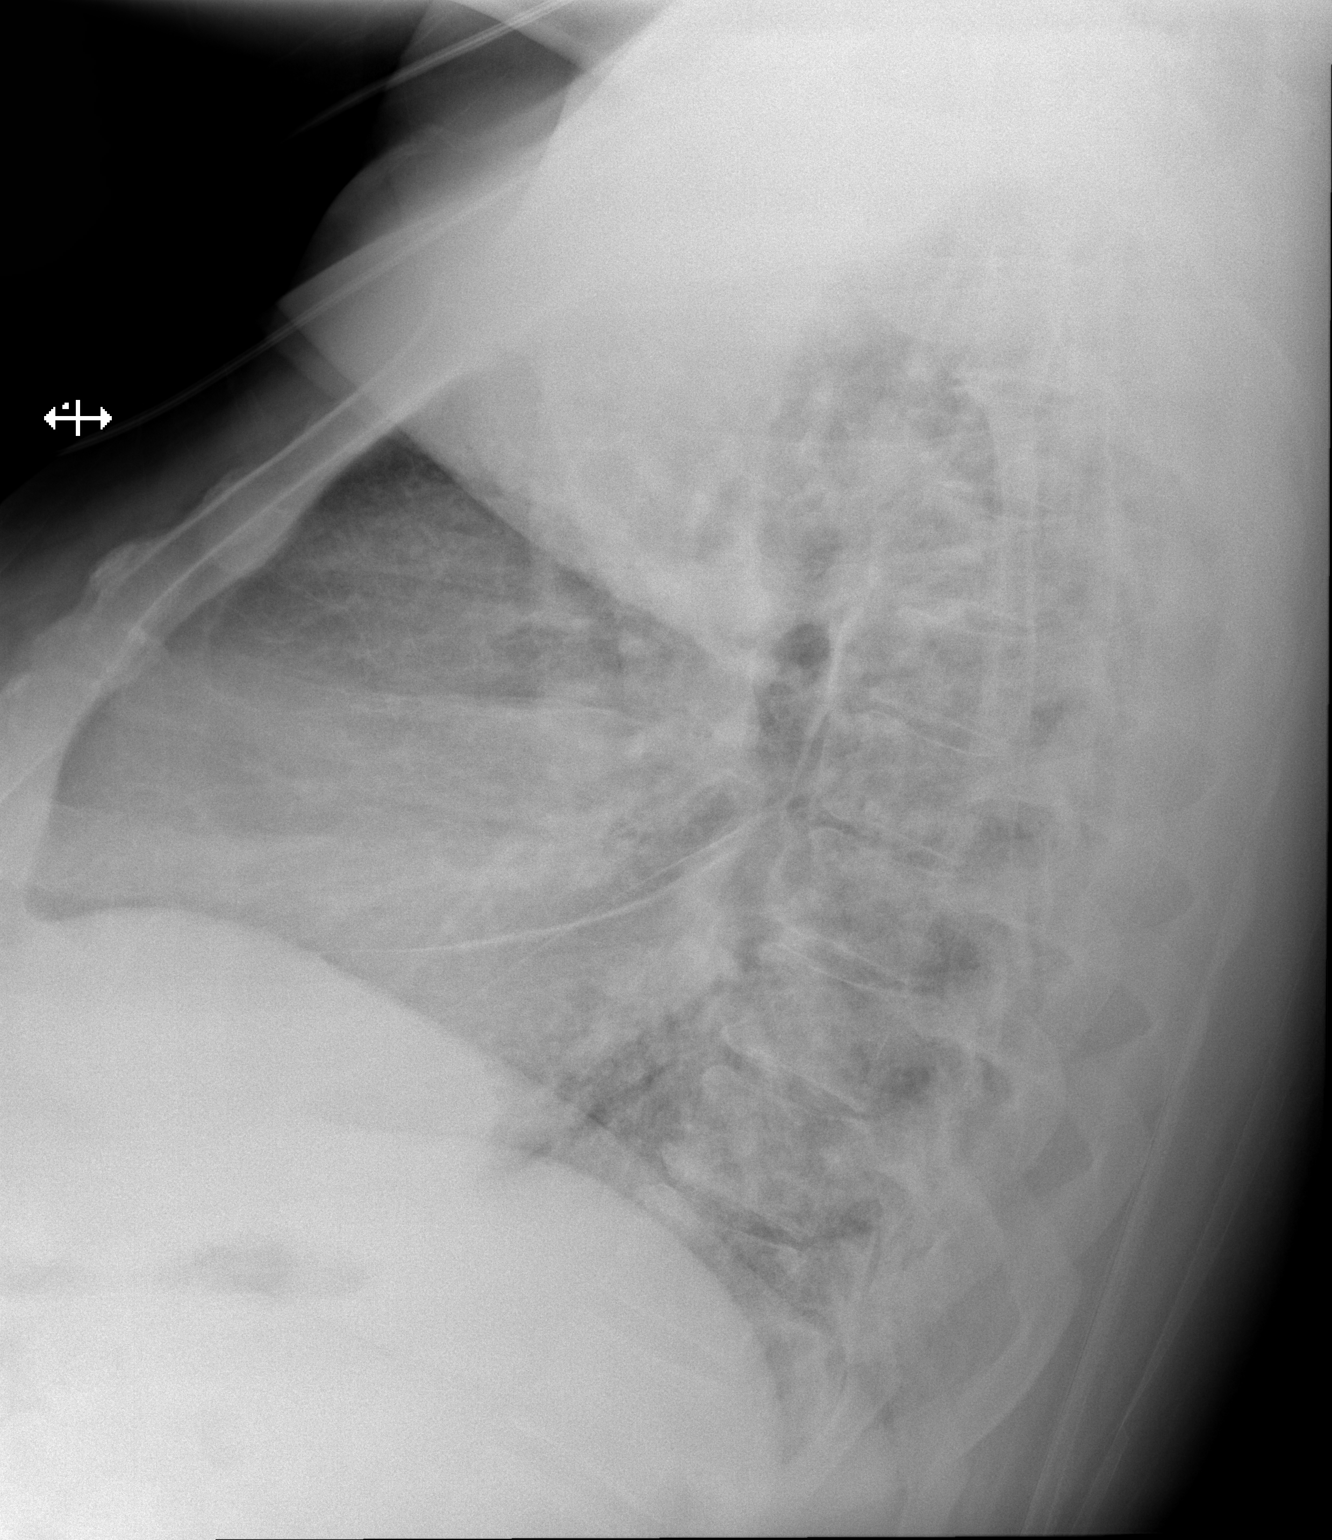

[x chest ap]
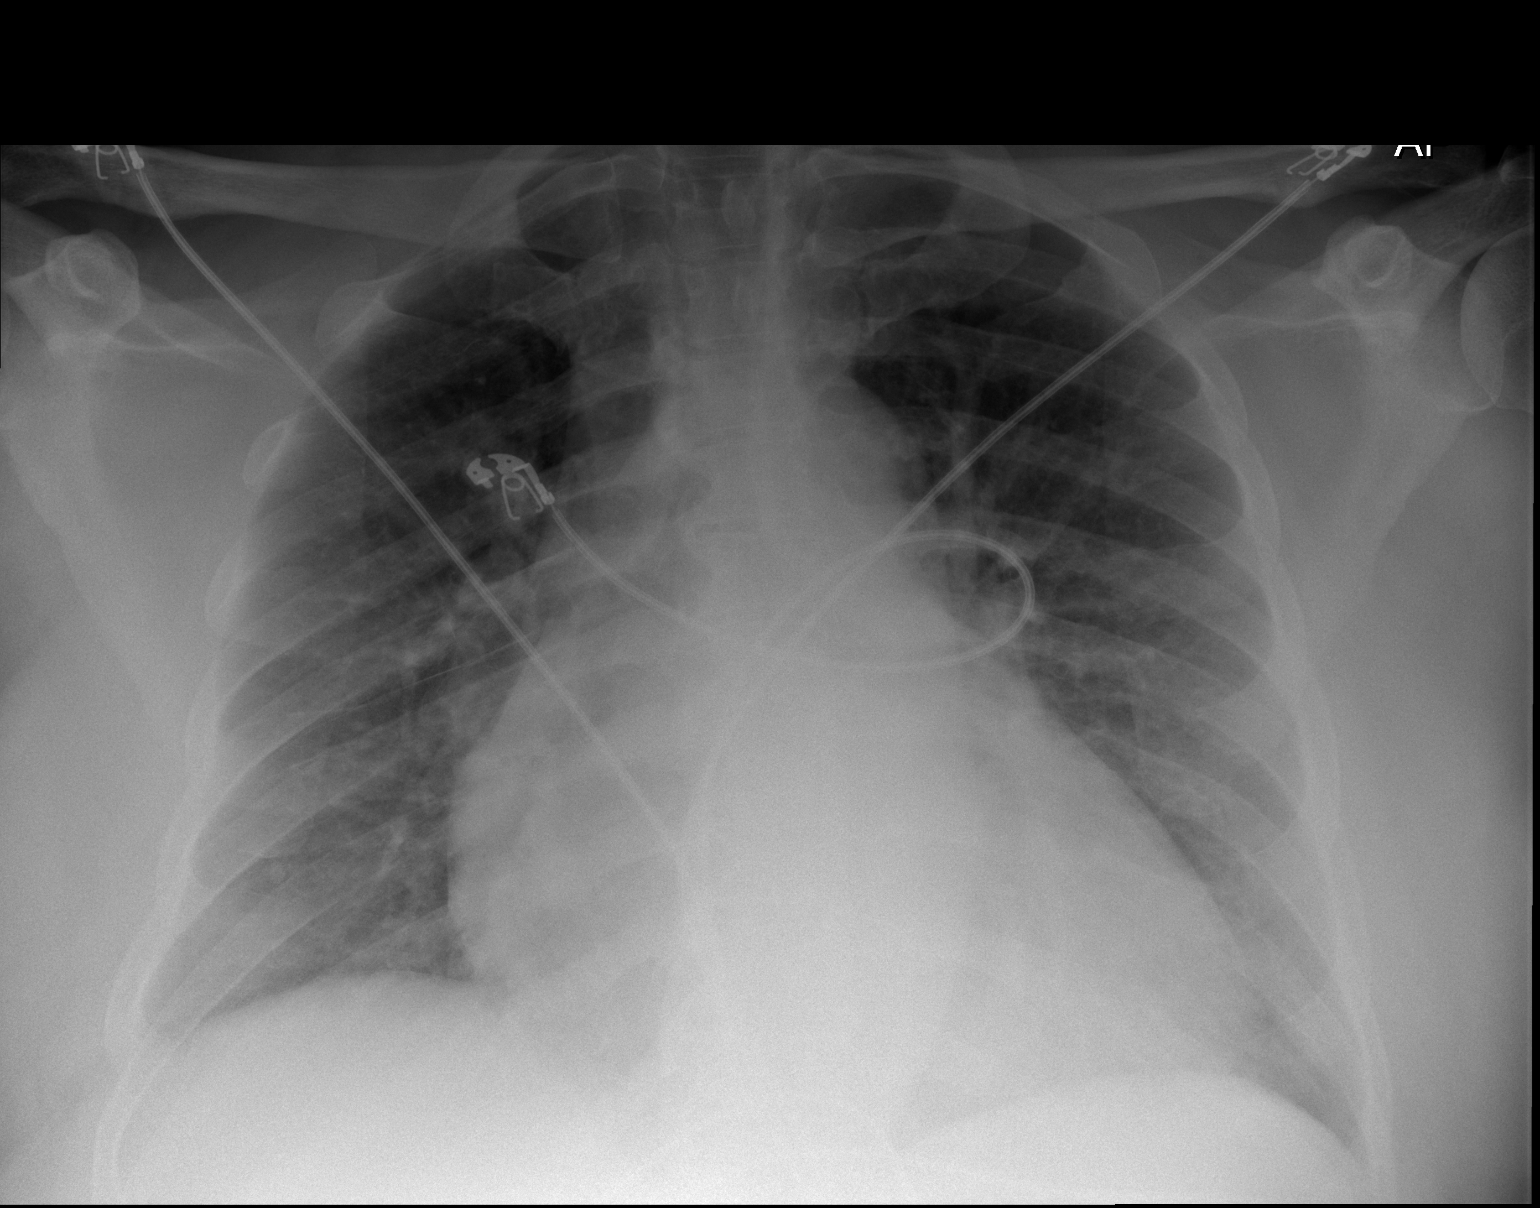

[2 of 2 positions shown; findings below may reference images not displayed]

FINDINGS: Mild bilateral interstitial thickening. There is no focal
parenchymal opacity. There is no pleural effusion or pneumothorax.
There is stable cardiomegaly.

The osseous structures are unremarkable.
IMPRESSION: Cardiomegaly with mild pulmonary vascular congestion.

## 2017-09-08 IMAGING — DX DG CHEST 2V
2 series · 2 of 2 positions shown · non-contrast
Comparison: 12/21/2015.

CLINICAL DATA: Shortness of breath and fluid retention.

EXAM:
CHEST  2 VIEW

[chest pa]
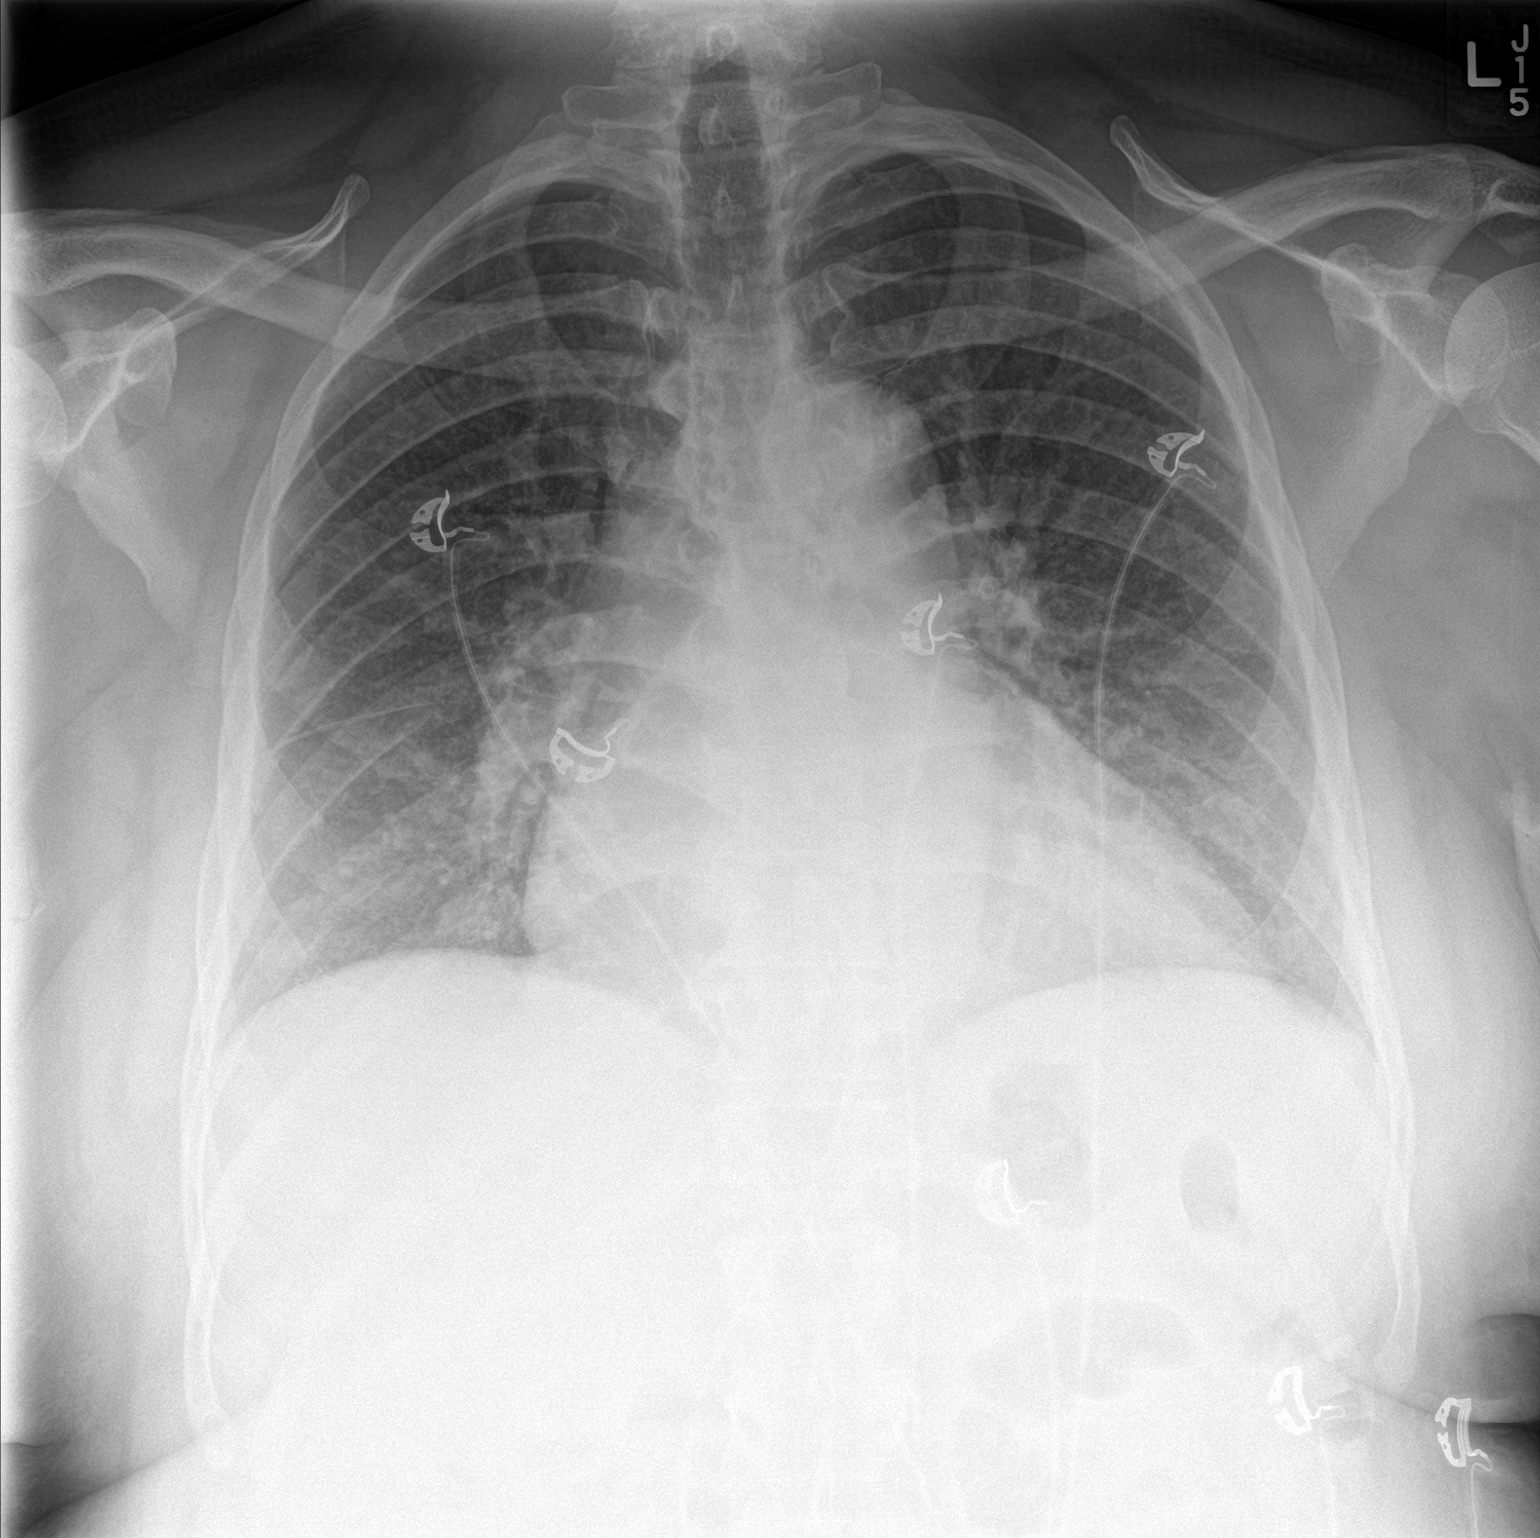

[chest lat]
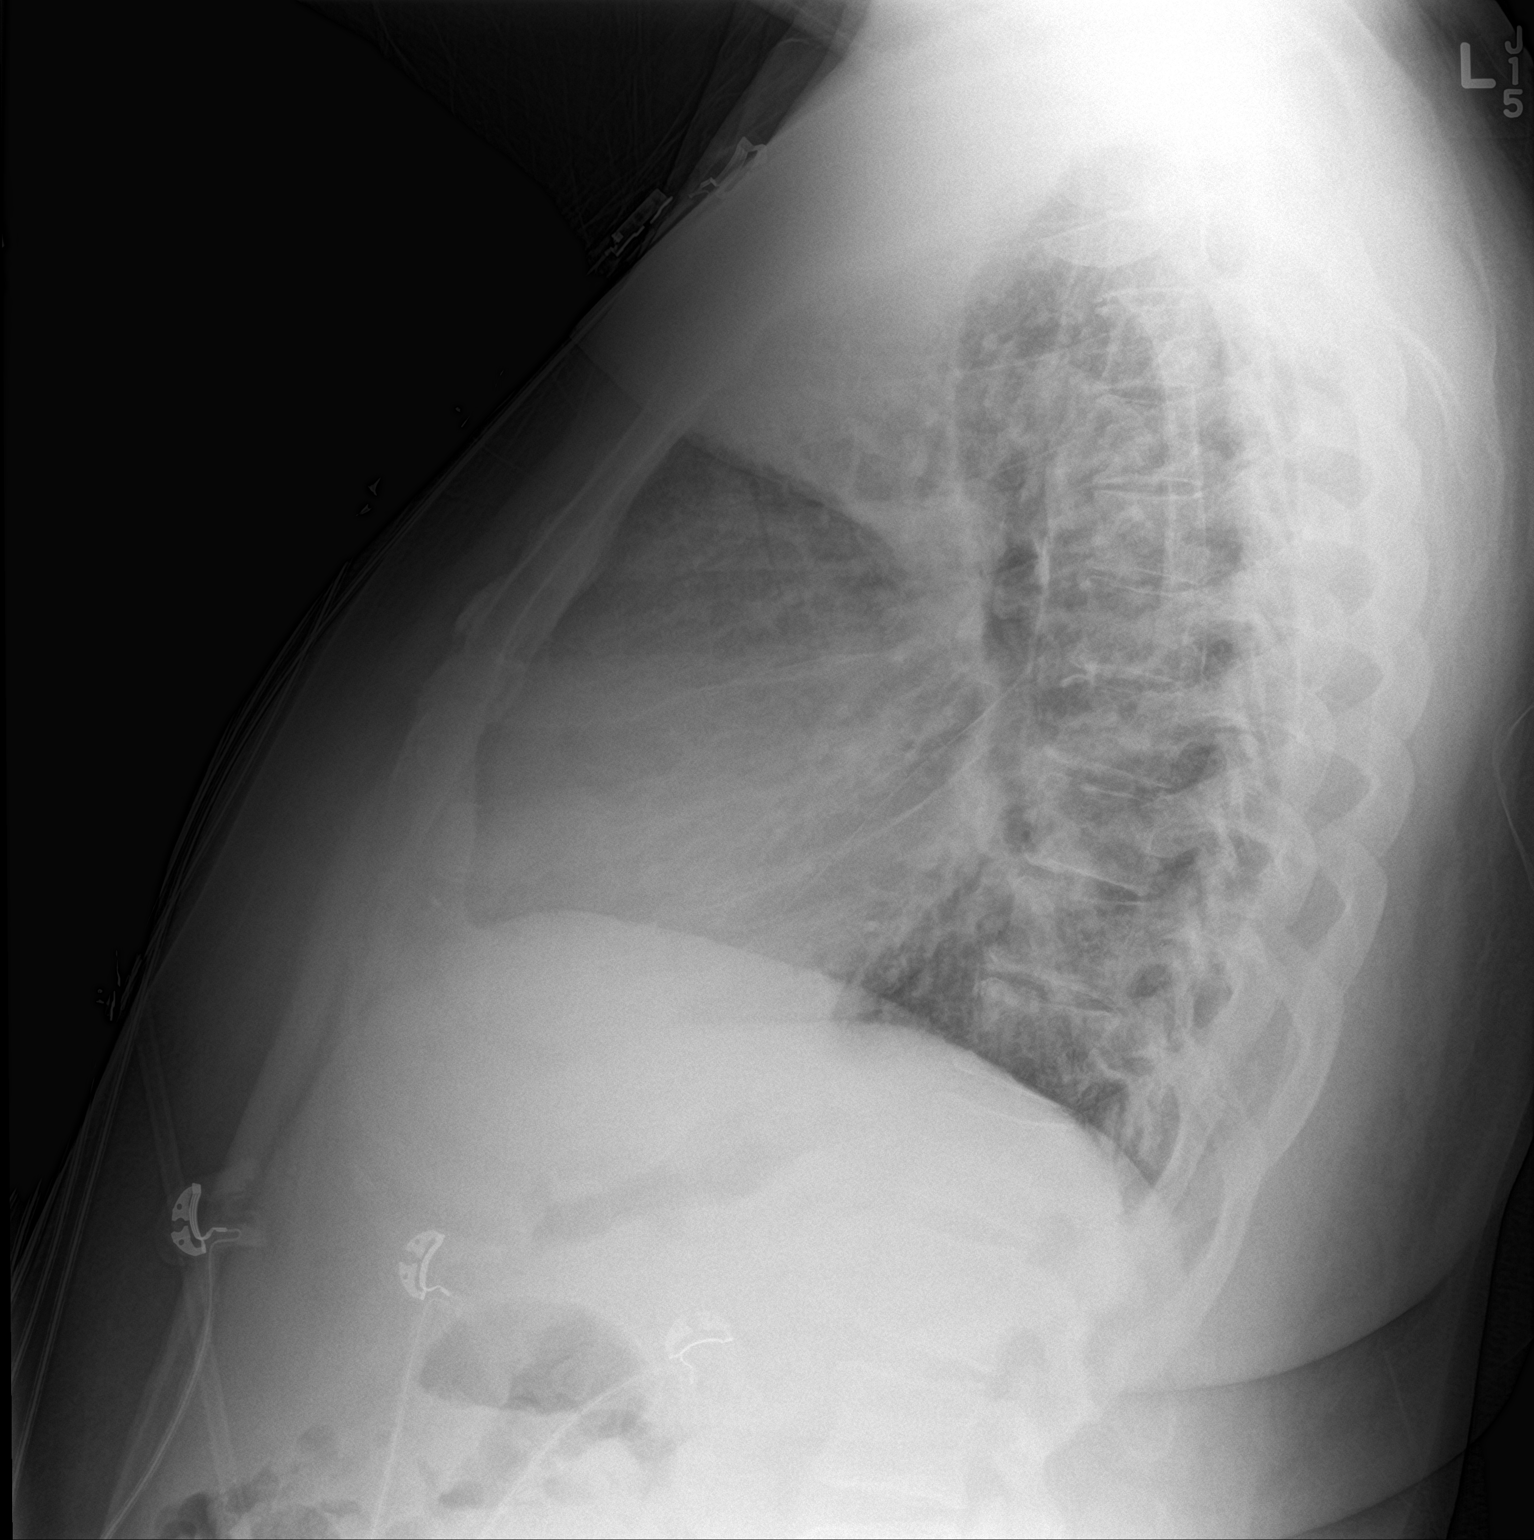

[2 of 2 positions shown; findings below may reference images not displayed]

FINDINGS: Low volumes. The cardio pericardial silhouette is enlarged. There is
pulmonary vascular congestion without overt pulmonary edema. A
component of interstitial edema at the bases is suspected. The
visualized bony structures of the thorax are intact. Telemetry leads
overlie the chest.
IMPRESSION: Low volume film with cardiomegaly and interstitial pulmonary edema.

## 2017-09-08 MED ORDER — IBUPROFEN 400 MG PO TABS
400.0000 mg | ORAL_TABLET | Freq: Once | ORAL | Status: AC
  Administered 2017-09-08: 400 mg via ORAL
  Filled 2017-09-08: qty 1

## 2017-09-08 MED ORDER — HYDRALAZINE HCL 20 MG/ML IJ SOLN
10.0000 mg | INTRAMUSCULAR | Status: DC | PRN
Start: 2017-09-08 — End: 2017-09-11

## 2017-09-08 MED ORDER — TAMSULOSIN HCL 0.4 MG PO CAPS
0.4000 mg | ORAL_CAPSULE | Freq: Every day | ORAL | Status: DC
  Administered 2017-09-09 – 2017-09-11 (×3): 0.4 mg via ORAL
  Filled 2017-09-08 (×3): qty 1

## 2017-09-08 MED ORDER — ONDANSETRON HCL 4 MG PO TABS: 4.0000 mg | ORAL_TABLET | Freq: Four times a day (QID) | ORAL | Status: DC | PRN

## 2017-09-08 MED ORDER — LEVALBUTEROL HCL 0.63 MG/3ML IN NEBU: 0.6300 mg | INHALATION_SOLUTION | Freq: Four times a day (QID) | RESPIRATORY_TRACT | Status: DC | PRN

## 2017-09-08 MED ORDER — ACETAMINOPHEN 650 MG RE SUPP: 650.0000 mg | Freq: Four times a day (QID) | RECTAL | Status: DC | PRN

## 2017-09-08 MED ORDER — SPIRONOLACTONE 25 MG PO TABS
50.0000 mg | ORAL_TABLET | Freq: Every day | ORAL | Status: DC
  Administered 2017-09-09 – 2017-09-11 (×3): 50 mg via ORAL
  Filled 2017-09-08: qty 2
  Filled 2017-09-08 (×2): qty 1
  Filled 2017-09-08 (×2): qty 2

## 2017-09-08 MED ORDER — PANTOPRAZOLE SODIUM 40 MG PO TBEC
40.0000 mg | DELAYED_RELEASE_TABLET | Freq: Every evening | ORAL | Status: DC
  Administered 2017-09-08 – 2017-09-10 (×3): 40 mg via ORAL
  Filled 2017-09-08 (×3): qty 1

## 2017-09-08 MED ORDER — SODIUM CHLORIDE 0.9% FLUSH
3.0000 mL | Freq: Two times a day (BID) | INTRAVENOUS | Status: DC
  Administered 2017-09-09 – 2017-09-11 (×5): 3 mL via INTRAVENOUS

## 2017-09-08 MED ORDER — FUROSEMIDE 10 MG/ML IJ SOLN
40.0000 mg | Freq: Once | INTRAMUSCULAR | Status: AC
  Administered 2017-09-08: 40 mg via INTRAVENOUS
  Filled 2017-09-08: qty 4

## 2017-09-08 MED ORDER — ONDANSETRON HCL 4 MG/2ML IJ SOLN: 4.0000 mg | Freq: Four times a day (QID) | INTRAMUSCULAR | Status: DC | PRN

## 2017-09-08 MED ORDER — FERROUS GLUCONATE 324 (38 FE) MG PO TABS
324.0000 mg | ORAL_TABLET | Freq: Every day | ORAL | Status: DC
  Administered 2017-09-09 – 2017-09-11 (×3): 324 mg via ORAL
  Filled 2017-09-08 (×5): qty 1

## 2017-09-08 MED ORDER — FUROSEMIDE 10 MG/ML IJ SOLN
80.0000 mg | Freq: Once | INTRAMUSCULAR | Status: AC
  Administered 2017-09-08: 80 mg via INTRAVENOUS
  Filled 2017-09-08: qty 8

## 2017-09-08 MED ORDER — DOCUSATE SODIUM 100 MG PO CAPS: 100.0000 mg | ORAL_CAPSULE | Freq: Two times a day (BID) | ORAL | Status: DC | PRN

## 2017-09-08 MED ORDER — BECLOMETHASONE DIPROPIONATE 80 MCG/ACT IN AERS: 2.0000 | INHALATION_SPRAY | Freq: Two times a day (BID) | RESPIRATORY_TRACT | Status: DC

## 2017-09-08 MED ORDER — RIVAROXABAN 20 MG PO TABS
20.0000 mg | ORAL_TABLET | Freq: Every day | ORAL | Status: DC
  Administered 2017-09-08 – 2017-09-10 (×3): 20 mg via ORAL
  Filled 2017-09-08 (×5): qty 1

## 2017-09-08 MED ORDER — POTASSIUM CHLORIDE 10 MEQ/100ML IV SOLN
10.0000 meq | INTRAVENOUS | Status: AC
  Administered 2017-09-08 (×4): 10 meq via INTRAVENOUS
  Filled 2017-09-08 (×4): qty 100

## 2017-09-08 MED ORDER — BUDESONIDE 0.25 MG/2ML IN SUSP
0.2500 mg | Freq: Two times a day (BID) | RESPIRATORY_TRACT | Status: DC
  Administered 2017-09-08 – 2017-09-11 (×6): 0.25 mg via RESPIRATORY_TRACT
  Filled 2017-09-08 (×10): qty 2

## 2017-09-08 MED ORDER — SODIUM CHLORIDE 0.9% FLUSH: 3.0000 mL | INTRAVENOUS | Status: DC | PRN

## 2017-09-08 MED ORDER — CARVEDILOL 12.5 MG PO TABS
25.0000 mg | ORAL_TABLET | Freq: Two times a day (BID) | ORAL | Status: DC
  Administered 2017-09-08 – 2017-09-11 (×6): 25 mg via ORAL
  Filled 2017-09-08 (×6): qty 2

## 2017-09-08 MED ORDER — IPRATROPIUM BROMIDE 0.02 % IN SOLN: 0.5000 mg | Freq: Four times a day (QID) | RESPIRATORY_TRACT | Status: DC | PRN

## 2017-09-08 MED ORDER — ATORVASTATIN CALCIUM 40 MG PO TABS
40.0000 mg | ORAL_TABLET | Freq: Every day | ORAL | Status: DC
  Administered 2017-09-08 – 2017-09-10 (×3): 40 mg via ORAL
  Filled 2017-09-08 (×5): qty 1

## 2017-09-08 MED ORDER — SODIUM CHLORIDE 0.9 % IV SOLN: 250.0000 mL | INTRAVENOUS | Status: DC | PRN

## 2017-09-08 MED ORDER — ACETAMINOPHEN 325 MG PO TABS: 650.0000 mg | ORAL_TABLET | Freq: Four times a day (QID) | ORAL | Status: DC | PRN

## 2017-09-08 MED ORDER — FUROSEMIDE 10 MG/ML IJ SOLN
80.0000 mg | Freq: Three times a day (TID) | INTRAMUSCULAR | Status: DC
  Administered 2017-09-08 – 2017-09-10 (×5): 80 mg via INTRAVENOUS
  Filled 2017-09-08 (×5): qty 8

## 2017-09-08 MED ORDER — POTASSIUM CHLORIDE CRYS ER 20 MEQ PO TBCR
20.0000 meq | EXTENDED_RELEASE_TABLET | Freq: Every day | ORAL | Status: DC
  Administered 2017-09-09 – 2017-09-11 (×3): 20 meq via ORAL
  Filled 2017-09-08 (×3): qty 1

## 2017-09-08 MED ORDER — ASPIRIN 81 MG PO CHEW
81.0000 mg | CHEWABLE_TABLET | Freq: Every day | ORAL | Status: DC
  Administered 2017-09-08 – 2017-09-11 (×4): 81 mg via ORAL
  Filled 2017-09-08 (×4): qty 1

## 2017-09-08 MED ORDER — LISINOPRIL 5 MG PO TABS
5.0000 mg | ORAL_TABLET | Freq: Every day | ORAL | Status: DC
  Administered 2017-09-08 – 2017-09-11 (×4): 5 mg via ORAL
  Filled 2017-09-08 (×4): qty 1

## 2017-09-08 NOTE — ED Provider Notes (Signed)
[facility] EMERGENCY DEPARTMENT Provider Note   CSN: [id number] Arrival date & time: 09/07/17  2142     History   Chief Complaint Chief Complaint  Patient presents with  . Leg Swelling    HPI [name] is a 52 y.o. male.  The history is provided by the patient.  Shortness of Breath  This is a chronic problem. Duration: "A while " The problem occurs frequently.The problem has not changed since onset.Associated symptoms include chest pain and leg swelling. Pertinent negatives include no fever. He has tried nothing (Ran out of his medicines) for the symptoms. Associated medical issues include heart failure.   History of atrial fibrillation, CHF, noncompliance, nonischemic cardiomyopathy presents with increasing lower extremity edema.  Reports he ran out of his medications "fluid pills "and now is having worsening lower extremity edema.  He reports shortness of breath.  He reports chest pain.  No other acute complaints. Past Medical History:  Diagnosis Date  . Allergic rhinitis   . Asthma   . Atrial fibrillation (HCC)   . CHF (congestive heart failure) (HCC)   . Essential hypertension   . Gastroesophageal reflux disease   . Heart attack (HCC)   . History of cardiac catheterization    Normal coronaries December 2016  . Noncompliance    Major problem leading to declining health and recurrent hospitalization  . Nonischemic cardiomyopathy (HCC)    LVEF 20-25%  . On home O2    3L N/C  . OSA (obstructive sleep apnea)   . Osteoarthritis   . Peptic ulcer disease     Patient Active Problem List   Diagnosis Date Noted  . Altered mental status 05/11/2017  . Acute exacerbation of CHF (congestive heart failure) (HCC) 04/20/2017  . Acute on chronic systolic (congestive) heart failure (HCC) 04/19/2017  . CHF exacerbation (HCC) 04/11/2017  . Chronic respiratory failure with hypoxia (HCC) 04/11/2017  . Encounter for hospice care discussion   . Palliative care encounter   .  Goals of care, counseling/discussion   . DNR (do not resuscitate)   . DNR (do not resuscitate) discussion   . Chronic congestive heart failure (HCC) 02/18/2017  . Acute systolic heart failure (HCC) 01/18/2017  . Pedal edema 12/02/2016  . Acute CHF (congestive heart failure) (HCC) 11/09/2016  . Acute on chronic systolic CHF (congestive heart failure) (HCC) 08/19/2016  . CKD (chronic kidney disease), stage II 05/17/2016  . Coronary arteries, normal Dec 2016 01/23/2016  . Chronic anticoagulation-Xarelto 01/23/2016  . NSVT (nonsustained ventricular tachycardia) (HCC) 01/23/2016  . Elevated troponin 12/21/2015  . Nonischemic cardiomyopathy (HCC)   . Morbid obesity due to excess calories (HCC)   . Noncompliance with diet and medication regimen 12/02/2015  . OSA (obstructive sleep apnea) 09/20/2015  . Pulmonary hypertension (HCC) 09/19/2015  . Normocytic anemia 09/19/2015  . Atrial fibrillation, chronic (HCC)   . Dyspnea on exertion 05/28/2015  . Acute respiratory failure with hypoxia (HCC) 04/01/2015  . Hypokalemia 04/01/2015  . Obesity hypoventilation syndrome (HCC) 08/08/2014  . GERD (gastroesophageal reflux disease) 08/08/2014  . Edema of right lower extremity 06/21/2014  . Fecal urgency 06/21/2014  . Asthma 02/11/2009  . Hyperlipidemia 07/12/2008  . OBESITY, MORBID 07/12/2008  . Anxiety state 07/12/2008  . DEPRESSION 07/12/2008  . Essential hypertension 07/12/2008  . ALLERGIC RHINITIS 07/12/2008  . Peptic ulcer 07/12/2008  . OSTEOARTHRITIS 07/12/2008    Past Surgical History:  Procedure Laterality Date  . CARDIAC CATHETERIZATION N/A 06/14/2015   Procedure: Right/Left Heart Cath and  Coronary Angiography;  Surgeon: Runell Gess, MD;  Location: [facility] INVASIVE CV LAB;  Service: Cardiovascular;  Laterality: N/A;       Home Medications    Prior to Admission medications   Medication Sig Start Date End Date Taking? Authorizing Provider  albuterol (PROVENTIL) (2.5 MG/3ML)  0.083% nebulizer solution Take 2.5 mg by nebulization every 6 (six) hours as needed for wheezing or shortness of breath.    [provider]  aspirin 81 MG chewable tablet Chew 1 tablet (81 mg total) by mouth daily. 04/13/17   Erick Blinks, MD  atorvastatin (LIPITOR) 40 MG tablet Take 1 tablet (40 mg total) by mouth daily at 6 PM. 06/25/16   Kathlen Mody, MD  beclomethasone (QVAR) 80 MCG/ACT inhaler Inhale 2 puffs into the lungs 2 (two) times daily.     [provider]  bumetanide (BUMEX) 2 MG tablet Take 1 tablet (2 mg total) by mouth 2 (two) times daily. 11/13/16   Catarina Hartshorn, MD  carvedilol (COREG) 25 MG tablet Take 1 tablet (25 mg total) by mouth 2 (two) times daily with a meal. 11/13/16   Tat, Onalee Hua, MD  docusate sodium (COLACE) 100 MG capsule Take 1 capsule (100 mg total) by mouth 2 (two) times daily. Patient taking differently: Take 100 mg by mouth 2 (two) times daily as needed for mild constipation.  06/25/16   Kathlen Mody, MD  ferrous gluconate (FERGON) 324 MG tablet Take 1 tablet (324 mg total) by mouth 2 (two) times daily with a meal. Patient taking differently: Take 324 mg by mouth daily with breakfast.  06/25/16   Kathlen Mody, MD  lisinopril (PRINIVIL,ZESTRIL) 5 MG tablet Take 1 tablet (5 mg total) by mouth daily. 04/13/17   Erick Blinks, MD  OXYGEN Inhale 3.5 L into the lungs daily.    [provider]  pantoprazole (PROTONIX) 40 MG tablet Take 1 tablet (40 mg total) by mouth daily at 6 (six) AM. Patient taking differently: Take 40 mg by mouth every evening.  06/26/16   Kathlen Mody, MD  potassium chloride (K-DUR,KLOR-CON) 20 MEQ tablet Take 1 tablet (20 mEq total) by mouth daily. 02/20/17   Houston Siren, MD  rivaroxaban (XARELTO) 20 MG TABS tablet Take 1 tablet (20 mg total) by mouth daily with breakfast. Patient taking differently: Take 20 mg by mouth daily with supper.  06/26/16   Kathlen Mody, MD  spironolactone (ALDACTONE) 50 MG tablet Take 50 mg daily by  mouth. For fluid 03/18/17   [provider]  tamsulosin (FLOMAX) 0.4 MG CAPS capsule Take 1 capsule (0.4 mg total) by mouth daily. 06/25/16   Kathlen Mody, MD  potassium chloride 20 MEQ TBCR Take 20 mEq by mouth 2 (two) times daily. 09/03/15 09/03/15  Ward, Layla Maw, DO    Family History Family History  Problem Relation Age of Onset  . Stroke Father   . Heart attack Father   . Aneurysm Mother        Cerebral aneurysm  . Hypertension Sister   . Colon cancer Neg Hx   . Inflammatory bowel disease Neg Hx   . Liver disease Neg Hx     Social History Social History   Tobacco Use  . Smoking status: Former Smoker    Packs/day: 0.50    Years: 20.00    Pack years: 10.00    Types: Cigarettes    Start date: 04/26/1988    Last attempt to quit: 06/23/2007    Years since quitting: 10.2  .  Smokeless tobacco: Never Used  . Tobacco comment: 1 ppd former smoker  Substance Use Topics  . Alcohol use: No    Alcohol/week: 0.0 oz    Comment: No etoh since 2009  . Drug use: No     Allergies   Banana; Bee venom; Food; Aspirin; Metolazone; Oatmeal; Orange juice [orange oil]; Torsemide; Diltiazem; Hydralazine; and Lipitor [atorvastatin]   Review of Systems Review of Systems  Constitutional: Negative for fever.  Respiratory: Positive for shortness of breath.   Cardiovascular: Positive for chest pain and leg swelling.  All other systems reviewed and are negative.    Physical Exam Updated Vital Signs BP (!) 161/115 (BP Location: Left Arm)   Pulse (!) 104   Temp 98.7 F (37.1 C) (Oral)   Resp 20   Ht 1.829 m (6')   Wt (!) 156.5 kg (345 lb)   SpO2 94%   BMI 46.79 kg/m   Physical Exam CONSTITUTIONAL: Chronically ill-appearing, no acute distress HEAD: Normocephalic/atraumatic EYES: Wearing sunglasses ENMT: Mucous membranes moist NECK: supple no meningeal signs CV: IRRegular, no loud murmurs LUNGS: Tachypnea, crackles noted ABDOMEN: soft, obese NEURO: Pt is  awake/alert/appropriate, moves all extremitiesx4.  No facial droop.   EXTREMITIES: pulses normal/equal, chronic appearing lower extremity edema noted SKIN: warm, color normal PSYCH: flat affect   ED Treatments / Results  Labs (all labs ordered are listed, but only abnormal results are displayed) Labs Reviewed  BASIC METABOLIC PANEL - Abnormal; Notable for the following components:      Result Value   Calcium 8.7 (*)    All other components within normal limits  CBC WITH DIFFERENTIAL/PLATELET - Abnormal; Notable for the following components:   Hemoglobin 12.9 (*)    All other components within normal limits    EKG  EKG Interpretation  Date/Time:  Wednesday September 08 2017 00:25:12 EDT Ventricular Rate:  106 PR Interval:    QRS Duration: 86 QT Interval:  385 QTC Calculation: 469 R Axis:   58 Text Interpretation:  Atrial fibrillation Ventricular bigeminy Nonspecific T abnormalities, lateral leads No significant change since last tracing Confirmed by [name] ([id number]) on 09/08/2017 1:59:29 AM       Radiology Dg Chest 2 View  Result Date: 09/08/2017 CLINICAL DATA:  Shortness of breath and mid chest pain. EXAM: CHEST - 2 VIEW COMPARISON:  Radiograph 09/03/2017 at [facility] FINDINGS: Cardiomegaly is unchanged. Stable pulmonary vasculature with vascular congestion. No confluent airspace disease, pleural effusion or pneumothorax. Soft tissue attenuation from habitus limits assessment. IMPRESSION: Unchanged cardiomegaly and vascular congestion. No acute abnormality. Electronically Signed   By: Rubye Oaks M.D.   On: 09/08/2017 02:41    Procedures Procedures (including critical care time)  Medications Ordered in ED Medications  furosemide (LASIX) injection 80 mg (80 mg Intravenous Given 09/08/17 0208)     Initial Impression / Assessment and Plan / ED Course  I have reviewed the triage vital signs and the nursing notes.  Pertinent labs & imaging results that were  available during my care of the patient were reviewed by me and considered in my medical decision making (see chart for details).     Patient chronically ill presents with lower extremity edema.  He reports recent noncompliance.  He now tells me he does not need any prescription refills.  Labs and imaging at baseline.  He is well-known emergency department, and appears at baseline.  Will discharge home.  He requests ibuprofen for his chronic leg pain.  Final Clinical Impressions(s) / ED  Diagnoses   Final diagnoses:  Peripheral edema  Chronic systolic congestive heart failure [facility])    ED Discharge Orders    None       [name], MD 09/08/17 [phone]

## 2017-09-08 NOTE — ED Provider Notes (Addendum)
[facility] EMERGENCY DEPARTMENT Provider Note   CSN: [id number] Arrival date & time: 09/08/17  1251     History   Chief Complaint Chief Complaint  Patient presents with  . Leg Swelling    HPI [name] is a 52 y.o. male.  Patient complains of weakness and severe swelling to the legs and some to his abdomen   The history is provided by the patient.  Weakness  Primary symptoms include no focal weakness. This is a recurrent problem. The current episode started more than 2 days ago. The problem has been gradually worsening. There was no focality noted. Pertinent negatives include no chest pain and no headaches.    Past Medical History:  Diagnosis Date  . Allergic rhinitis   . Asthma   . Atrial fibrillation (HCC)   . CHF (congestive heart failure) (HCC)   . Essential hypertension   . Gastroesophageal reflux disease   . Heart attack (HCC)   . History of cardiac catheterization    Normal coronaries December 2016  . Noncompliance    Major problem leading to declining health and recurrent hospitalization  . Nonischemic cardiomyopathy (HCC)    LVEF 20-25%  . On home O2    3L N/C  . OSA (obstructive sleep apnea)   . Osteoarthritis   . Peptic ulcer disease     Patient Active Problem List   Diagnosis Date Noted  . Altered mental status 05/11/2017  . Acute exacerbation of CHF (congestive heart failure) (HCC) 04/20/2017  . Acute on chronic systolic (congestive) heart failure (HCC) 04/19/2017  . CHF exacerbation (HCC) 04/11/2017  . Chronic respiratory failure with hypoxia (HCC) 04/11/2017  . Encounter for hospice care discussion   . Palliative care encounter   . Goals of care, counseling/discussion   . DNR (do not resuscitate)   . DNR (do not resuscitate) discussion   . Chronic congestive heart failure (HCC) 02/18/2017  . Acute systolic heart failure (HCC) 01/18/2017  . Pedal edema 12/02/2016  . Acute CHF (congestive heart failure) (HCC) 11/09/2016  . Acute on  chronic systolic CHF (congestive heart failure) (HCC) 08/19/2016  . CKD (chronic kidney disease), stage II 05/17/2016  . Coronary arteries, normal Dec 2016 01/23/2016  . Chronic anticoagulation-Xarelto 01/23/2016  . NSVT (nonsustained ventricular tachycardia) (HCC) 01/23/2016  . Elevated troponin 12/21/2015  . Nonischemic cardiomyopathy (HCC)   . Morbid obesity due to excess calories (HCC)   . Noncompliance with diet and medication regimen 12/02/2015  . OSA (obstructive sleep apnea) 09/20/2015  . Pulmonary hypertension (HCC) 09/19/2015  . Normocytic anemia 09/19/2015  . Atrial fibrillation, chronic (HCC)   . Dyspnea on exertion 05/28/2015  . Acute respiratory failure with hypoxia (HCC) 04/01/2015  . Hypokalemia 04/01/2015  . Obesity hypoventilation syndrome (HCC) 08/08/2014  . GERD (gastroesophageal reflux disease) 08/08/2014  . Edema of right lower extremity 06/21/2014  . Fecal urgency 06/21/2014  . Asthma 02/11/2009  . Hyperlipidemia 07/12/2008  . OBESITY, MORBID 07/12/2008  . Anxiety state 07/12/2008  . DEPRESSION 07/12/2008  . Essential hypertension 07/12/2008  . ALLERGIC RHINITIS 07/12/2008  . Peptic ulcer 07/12/2008  . OSTEOARTHRITIS 07/12/2008    Past Surgical History:  Procedure Laterality Date  . CARDIAC CATHETERIZATION N/A 06/14/2015   Procedure: Right/Left Heart Cath and Coronary Angiography;  Surgeon: Runell Gess, MD;  Location: [facility] INVASIVE CV LAB;  Service: Cardiovascular;  Laterality: N/A;       Home Medications    Prior to Admission medications   Medication Sig Start Date  End Date Taking? Authorizing Provider  albuterol (PROVENTIL) (2.5 MG/3ML) 0.083% nebulizer solution Take 2.5 mg by nebulization every 6 (six) hours as needed for wheezing or shortness of breath.   Yes [provider]  aspirin 81 MG chewable tablet Chew 1 tablet (81 mg total) by mouth daily. 04/13/17  Yes Erick Blinks, MD  atorvastatin (LIPITOR) 40 MG tablet Take 1 tablet  (40 mg total) by mouth daily at 6 PM. 06/25/16  Yes Kathlen Mody, MD  beclomethasone (QVAR) 80 MCG/ACT inhaler Inhale 2 puffs into the lungs 2 (two) times daily.    Yes [provider]  bumetanide (BUMEX) 2 MG tablet Take 1 tablet (2 mg total) by mouth 2 (two) times daily. 11/13/16  Yes Tat, Onalee Hua, MD  carvedilol (COREG) 25 MG tablet Take 1 tablet (25 mg total) by mouth 2 (two) times daily with a meal. 11/13/16  Yes Tat, Onalee Hua, MD  docusate sodium (COLACE) 100 MG capsule Take 1 capsule (100 mg total) by mouth 2 (two) times daily. Patient taking differently: Take 100 mg by mouth 2 (two) times daily as needed for mild constipation.  06/25/16  Yes Kathlen Mody, MD  ferrous gluconate (FERGON) 324 MG tablet Take 1 tablet (324 mg total) by mouth 2 (two) times daily with a meal. Patient taking differently: Take 324 mg by mouth daily with breakfast.  06/25/16  Yes Kathlen Mody, MD  OXYGEN Inhale 3.5 L into the lungs daily.   Yes [provider]  pantoprazole (PROTONIX) 40 MG tablet Take 1 tablet (40 mg total) by mouth daily at 6 (six) AM. Patient taking differently: Take 40 mg by mouth every evening.  06/26/16  Yes Kathlen Mody, MD  potassium chloride (K-DUR,KLOR-CON) 20 MEQ tablet Take 1 tablet (20 mEq total) by mouth daily. 02/20/17  Yes Houston Siren, MD  rivaroxaban (XARELTO) 20 MG TABS tablet Take 1 tablet (20 mg total) by mouth daily with breakfast. Patient taking differently: Take 20 mg by mouth daily with supper.  06/26/16  Yes Kathlen Mody, MD  spironolactone (ALDACTONE) 50 MG tablet Take 50 mg daily by mouth. For fluid 03/18/17  Yes [provider]  tamsulosin (FLOMAX) 0.4 MG CAPS capsule Take 1 capsule (0.4 mg total) by mouth daily. 06/25/16  Yes Kathlen Mody, MD  lisinopril (PRINIVIL,ZESTRIL) 5 MG tablet Take 1 tablet (5 mg total) by mouth daily. Patient not taking: Reported on 09/08/2017 04/13/17   Erick Blinks, MD  potassium chloride 20 MEQ TBCR Take 20 mEq by mouth 2 (two)  times daily. 09/03/15 09/03/15  Ward, Layla Maw, DO    Family History Family History  Problem Relation Age of Onset  . Stroke Father   . Heart attack Father   . Aneurysm Mother        Cerebral aneurysm  . Hypertension Sister   . Colon cancer Neg Hx   . Inflammatory bowel disease Neg Hx   . Liver disease Neg Hx     Social History Social History   Tobacco Use  . Smoking status: Former Smoker    Packs/day: 0.50    Years: 20.00    Pack years: 10.00    Types: Cigarettes    Start date: 04/26/1988    Last attempt to quit: 06/23/2007    Years since quitting: 10.2  . Smokeless tobacco: Never Used  . Tobacco comment: 1 ppd former smoker  Substance Use Topics  . Alcohol use: No    Alcohol/week: 0.0 oz    Comment: No etoh since 2009  .  Drug use: No     Allergies   Banana; Bee venom; Food; Aspirin; Metolazone; Oatmeal; Orange juice [orange oil]; Torsemide; Diltiazem; Hydralazine; and Lipitor [atorvastatin]   Review of Systems Review of Systems  Constitutional: Negative for appetite change and fatigue.  HENT: Negative for congestion, ear discharge and sinus pressure.   Eyes: Negative for discharge.  Respiratory: Negative for cough.   Cardiovascular: Negative for chest pain.  Gastrointestinal: Negative for abdominal pain and diarrhea.  Genitourinary: Negative for frequency and hematuria.  Musculoskeletal: Negative for back pain.  Skin: Negative for rash.  Neurological: Positive for weakness. Negative for focal weakness, seizures and headaches.  Psychiatric/Behavioral: Negative for hallucinations.     Physical Exam Updated Vital Signs BP 131/87 (BP Location: Right Arm)   Pulse (!) 104 Comment: pt sitting up at bedside  Temp 97.9 F (36.6 C) (Oral)   Resp 20   Ht 6' (1.829 m)   Wt (!) 156.5 kg (345 lb)   SpO2 98% Comment: on 3L [place]  BMI 46.79 kg/m   Physical Exam  Constitutional: He is oriented to person, place, and time. He appears well-developed.  HENT:  Head:  Normocephalic.  Eyes: Conjunctivae and EOM are normal. No scleral icterus.  Neck: Neck supple. No thyromegaly present.  Cardiovascular: Normal rate and regular rhythm. Exam reveals no gallop and no friction rub.  No murmur heard. Pulmonary/Chest: No stridor. He has no wheezes. He has no rales. He exhibits no tenderness.  Abdominal: He exhibits no distension. There is no tenderness. There is no rebound.  Patient has edema in the lower part of the abdomen  Musculoskeletal: Normal range of motion. He exhibits edema.  Severe edema to both legs  Lymphadenopathy:    He has no cervical adenopathy.  Neurological: He is oriented to person, place, and time. He exhibits normal muscle tone. Coordination normal.  Skin: No rash noted. No erythema.  Psychiatric: He has a normal mood and affect. His behavior is normal.     ED Treatments / Results  Labs (all labs ordered are listed, but only abnormal results are displayed) Labs Reviewed  COMPREHENSIVE METABOLIC PANEL - Abnormal; Notable for the following components:      Result Value   Potassium 3.1 (*)    Chloride 100 (*)    Calcium 8.5 (*)    Total Bilirubin 1.3 (*)    All other components within normal limits  TROPONIN I - Abnormal; Notable for the following components:   Troponin I 0.04 (*)    All other components within normal limits  BRAIN NATRIURETIC PEPTIDE - Abnormal; Notable for the following components:   B Natriuretic Peptide 233.0 (*)    All other components within normal limits  CBC WITH DIFFERENTIAL/PLATELET    EKG Interpretation  Date/Time:    Ventricular Rate:    PR Interval:    QRS Duration:   QT Interval:    QTC Calculation:   R Axis:     Text Interpretation:        ekg, EKG  EKG Interpretation None      Ekg, ventricular rate 96, pr atrial fib,  qrs 117,      Qt 390,  Qtc493,  Axis normal,   Atrial fib with nonspecific st changes Radiology Dg Chest 2 View  Result Date: 09/08/2017 CLINICAL DATA:   Shortness of breath and mid chest pain. EXAM: CHEST - 2 VIEW COMPARISON:  Radiograph 09/03/2017 at [facility] FINDINGS: Cardiomegaly is unchanged. Stable pulmonary vasculature with vascular congestion. No  confluent airspace disease, pleural effusion or pneumothorax. Soft tissue attenuation from habitus limits assessment. IMPRESSION: Unchanged cardiomegaly and vascular congestion. No acute abnormality. Electronically Signed   By: Rubye Oaks M.D.   On: 09/08/2017 02:41   Dg Chest Portable 1 View  Result Date: 09/08/2017 CLINICAL DATA:  Chest pain and shortness of breath EXAM: PORTABLE CHEST 1 VIEW COMPARISON:  September 08, 2017 FINDINGS: There is no edema or consolidation. There is cardiomegaly with pulmonary venous hypertension. No adenopathy. No evident bone lesions. IMPRESSION: Pulmonary vascular congestion.  No edema or consolidation. Electronically Signed   By: Bretta Bang III M.D.   On: 09/08/2017 13:29    Procedures Procedures (including critical care time)  Medications Ordered in ED Medications  furosemide (LASIX) injection 40 mg (not administered)     Initial Impression / Assessment and Plan / ED Course  I have reviewed the triage vital signs and the nursing notes.  Pertinent labs & imaging results that were available during my care of the patient were reviewed by me and considered in my medical decision making (see chart for details).    Patient with anasarca.  We will admit for diuresis  Final Clinical Impressions(s) / ED Diagnoses   Final diagnoses:  Anasarca    ED Discharge Orders    None       Bethann Berkshire, MD 09/08/17 1512    Bethann Berkshire, MD 09/22/17 1053

## 2017-09-08 NOTE — ED Notes (Signed)
Patient returned from XRay

## 2017-09-08 NOTE — H&P (Addendum)
History and Physical    [name] ZOX:[id number] DOB: Oct 03, 1965 DOA: 09/08/2017  PCP: Samuel Grippe, MD   Patient coming from: Home  Chief Complaint: Worsening lower extremity edema and shortness of breath  HPI: [name] is a 52 y.o. male with medical history significant for systolic congestive heart failure with EF 55% on previous echo 04/2017, obesity, asthma, GERD, hypertension, anemia, and chronic atrial fibrillation on Xarelto who presented to the emergency department with severe lower extremity edema noted bilaterally as well as to his abdomen that has persisted since his emergency room visit last night.  He was noted to receive a one-time dose of 80 mg IV Lasix at that time and apparently diuresed, but not well enough and was still quite symptomatic when he was discharged home.  He states that he is watching his fluid intake as well as sodium intake and is very careful with his diet.  He takes Bumex 2 mg 3 times a day and is compliant with his medications. He states that he has worsening shortness of breath with laying flat. He had initial mild substernal chest pain with no radiation, but this is resolved. He has had a mild cough that has been nonproductive.  He denies fever and chills. He denies any nausea vomiting or diaphoresis.   ED Course: Vital signs are noted to be stable and he is noted to be in atrial fibrillation on telemetry.  EKG with no concerning findings.  Chest x-ray notable for pulmonary vascular congestion.  Laboratory data with troponin 0 0.04, potassium 3.1.  And his creatinine appears to be at his baseline and is noted to be 1.1.  He has only received a one-time dose of IV Lasix 40 mg with no significant diuresis noted at this time.  He currently has to sit upright to prevent respiratory distress and is currently on 2 L nasal cannula.  Review of Systems: All others reviewed and otherwise negative.  Past Medical History:  Diagnosis Date  . Allergic rhinitis     . Asthma   . Atrial fibrillation (HCC)   . CHF (congestive heart failure) (HCC)   . Essential hypertension   . Gastroesophageal reflux disease   . Heart attack (HCC)   . History of cardiac catheterization    Normal coronaries December 2016  . Noncompliance    Major problem leading to declining health and recurrent hospitalization  . Nonischemic cardiomyopathy (HCC)    LVEF 20-25%  . On home O2    3L N/C  . OSA (obstructive sleep apnea)   . Osteoarthritis   . Peptic ulcer disease     Past Surgical History:  Procedure Laterality Date  . CARDIAC CATHETERIZATION N/A 06/14/2015   Procedure: Right/Left Heart Cath and Coronary Angiography;  Surgeon: Samuel Gess, MD;  Location: [facility] INVASIVE CV LAB;  Service: Cardiovascular;  Laterality: N/A;     reports that he quit smoking about 10 years ago. His smoking use included cigarettes. He started smoking about 29 years ago. He has a 10.00 pack-year smoking history. he has never used smokeless tobacco. He reports that he does not drink alcohol or use drugs.  Allergies  Allergen Reactions  . Banana Shortness Of Breath  . Bee Venom Shortness Of Breath, Swelling and Other (See Comments)    Reaction:  Facial swelling  . Food Shortness Of Breath, Rash and Other (See Comments)    Pt states that he is allergic to strawberries.    . Aspirin Other (See Comments)  Reaction:  GI upset   . Metolazone Other (See Comments)    Pt states that he stopped taking this med due to heart attack like symptoms.   . Oatmeal Nausea And Vomiting  . Orange Juice [Orange Oil] Nausea And Vomiting    All acidic products make him nauseous and upset stomach  . Torsemide Swelling and Other (See Comments)    Reaction:  Swelling of feet/legs   . Diltiazem Palpitations  . Hydralazine Palpitations  . Lipitor [Atorvastatin] Other (See Comments)    Reaction:  Nose bleeds     Family History  Problem Relation Age of Onset  . Stroke Father   . Heart attack Father    . Aneurysm Mother        Cerebral aneurysm  . Hypertension Sister   . Colon cancer Neg Hx   . Inflammatory bowel disease Neg Hx   . Liver disease Neg Hx     Prior to Admission medications   Medication Sig Start Date End Date Taking? Authorizing Provider  albuterol (PROVENTIL) (2.5 MG/3ML) 0.083% nebulizer solution Take 2.5 mg by nebulization every 6 (six) hours as needed for wheezing or shortness of breath.   Yes [provider]  aspirin 81 MG chewable tablet Chew 1 tablet (81 mg total) by mouth daily. 04/13/17  Yes Samuel Blinks, MD  atorvastatin (LIPITOR) 40 MG tablet Take 1 tablet (40 mg total) by mouth daily at 6 PM. 06/25/16  Yes Samuel Mody, MD  beclomethasone (QVAR) 80 MCG/ACT inhaler Inhale 2 puffs into the lungs 2 (two) times daily.    Yes [provider]  bumetanide (BUMEX) 2 MG tablet Take 1 tablet (2 mg total) by mouth 2 (two) times daily. 11/13/16  Yes Fitzpatrick, Samuel Hua, MD  carvedilol (COREG) 25 MG tablet Take 1 tablet (25 mg total) by mouth 2 (two) times daily with a meal. 11/13/16  Yes Fitzpatrick, Samuel Hua, MD  docusate sodium (COLACE) 100 MG capsule Take 1 capsule (100 mg total) by mouth 2 (two) times daily. Patient taking differently: Take 100 mg by mouth 2 (two) times daily as needed for mild constipation.  06/25/16  Yes Samuel Mody, MD  ferrous gluconate (FERGON) 324 MG tablet Take 1 tablet (324 mg total) by mouth 2 (two) times daily with a meal. Patient taking differently: Take 324 mg by mouth daily with breakfast.  06/25/16  Yes Samuel Mody, MD  OXYGEN Inhale 3.5 L into the lungs daily.   Yes [provider]  pantoprazole (PROTONIX) 40 MG tablet Take 1 tablet (40 mg total) by mouth daily at 6 (six) AM. Patient taking differently: Take 40 mg by mouth every evening.  06/26/16  Yes Samuel Mody, MD  potassium chloride (K-DUR,KLOR-CON) 20 MEQ tablet Take 1 tablet (20 mEq total) by mouth daily. 02/20/17  Yes Samuel Siren, MD  rivaroxaban (XARELTO) 20 MG TABS tablet Take  1 tablet (20 mg total) by mouth daily with breakfast. Patient taking differently: Take 20 mg by mouth daily with supper.  06/26/16  Yes Samuel Mody, MD  spironolactone (ALDACTONE) 50 MG tablet Take 50 mg daily by mouth. For fluid 03/18/17  Yes [provider]  tamsulosin (FLOMAX) 0.4 MG CAPS capsule Take 1 capsule (0.4 mg total) by mouth daily. 06/25/16  Yes Samuel Mody, MD  lisinopril (PRINIVIL,ZESTRIL) 5 MG tablet Take 1 tablet (5 mg total) by mouth daily. Patient not taking: Reported on 09/08/2017 04/13/17   Samuel Blinks, MD  potassium chloride 20 MEQ TBCR Take 20 mEq by mouth  2 (two) times daily. 09/03/15 09/03/15  Ward, Layla Maw, DO    Physical Exam: Vitals:   09/08/17 1259 09/08/17 1301 09/08/17 1415  BP: 131/87    Pulse: (!) 104  70  Resp: 20    Temp: 97.9 F (36.6 C)    TempSrc: Oral    SpO2: 98%  100%  Weight:  (!) 156.5 kg (345 lb)   Height:  6' (1.829 m)     Constitutional: NAD, calm, comfortable; sitting upright, obese. Vitals:   09/08/17 1259 09/08/17 1301 09/08/17 1415  BP: 131/87    Pulse: (!) 104  70  Resp: 20    Temp: 97.9 F (36.6 C)    TempSrc: Oral    SpO2: 98%  100%  Weight:  (!) 156.5 kg (345 lb)   Height:  6' (1.829 m)    Eyes: lids and conjunctivae normal ENMT: Mucous membranes are moist.  Neck: normal, supple Respiratory: clear to auscultation bilaterally. Normal respiratory effort. No accessory muscle use.  On 2 L nasal cannula. Cardiovascular: irregular rate and rhythm, no murmurs.  Significant bilateral lower extremity edema with chronic skin changes noted. Abdomen: no tenderness, no distention. Bowel sounds positive.  Obese. Musculoskeletal:  No joint deformity upper and lower extremities.   Skin: no rashes, lesions, ulcers.  Psychiatric: Normal judgment and insight. Alert and oriented x 3. Normal mood.   Labs on Admission: I have personally reviewed following labs and imaging studies  CBC: Recent Labs  Lab 09/08/17 0141  09/08/17 1340  WBC 5.3 4.7  NEUTROABS 3.7 3.2  HGB 12.9* 13.2  HCT 40.4 41.9  MCV 87.1 88.4  PLT 223 205   Basic Metabolic Panel: Recent Labs  Lab 09/08/17 0141 09/08/17 1340  NA 141 140  K 3.5 3.1*  CL 102 100*  CO2 27 28  GLUCOSE 97 90  BUN 12 13  CREATININE 1.10 1.10  CALCIUM 8.7* 8.5*   GFR: Estimated Creatinine Clearance: 125.5 mL/min (by C-G formula based on SCr of 1.1 mg/dL). Liver Function Tests: Recent Labs  Lab 09/08/17 1340  AST 25  ALT 18  ALKPHOS 78  BILITOT 1.3*  PROT 7.2  ALBUMIN 3.7   No results for input(s): LIPASE, AMYLASE in the last 168 hours. No results for input(s): AMMONIA in the last 168 hours. Coagulation Profile: No results for input(s): INR, PROTIME in the last 168 hours. Cardiac Enzymes: Recent Labs  Lab 09/08/17 1340  TROPONINI 0.04*   BNP (last 3 results) No results for input(s): PROBNP in the last 8760 hours. HbA1C: No results for input(s): HGBA1C in the last 72 hours. CBG: No results for input(s): GLUCAP in the last 168 hours. Lipid Profile: No results for input(s): CHOL, HDL, LDLCALC, TRIG, CHOLHDL, LDLDIRECT in the last 72 hours. Thyroid Function Tests: No results for input(s): TSH, T4TOTAL, FREET4, T3FREE, THYROIDAB in the last 72 hours. Anemia Panel: No results for input(s): VITAMINB12, FOLATE, FERRITIN, TIBC, IRON, RETICCTPCT in the last 72 hours. Urine analysis:    Component Value Date/Time   COLORURINE STRAW (A) 05/11/2017 0302   APPEARANCEUR CLEAR 05/11/2017 0302   LABSPEC 1.006 05/11/2017 0302   PHURINE 6.0 05/11/2017 0302   GLUCOSEU NEGATIVE 05/11/2017 0302   GLUCOSEU NEG mg/dL 16/03/9603 5409   HGBUR NEGATIVE 05/11/2017 0302   BILIRUBINUR NEGATIVE 05/11/2017 0302   KETONESUR NEGATIVE 05/11/2017 0302   PROTEINUR NEGATIVE 05/11/2017 0302   UROBILINOGEN 0.2 08/08/2014 1445   NITRITE NEGATIVE 05/11/2017 0302   LEUKOCYTESUR NEGATIVE 05/11/2017 0302  Radiological Exams on Admission: Dg Chest 2  View  Result Date: 09/08/2017 CLINICAL DATA:  Shortness of breath and mid chest pain. EXAM: CHEST - 2 VIEW COMPARISON:  R[facility]ograph 09/03/2017 at [facility] FINDINGS: Cardiomegaly is unchanged. Stable pulmonary vasculature with vascular congestion. No confluent airspace disease, pleural effusion or pneumothorax. Soft tissue attenuation from habitus limits assessment. IMPRESSION: Unchanged cardiomegaly and vascular congestion. No acute abnormality. Electronically Signed   By: Rubye Oaks M.D.   On: 09/08/2017 02:41   Dg Chest Portable 1 View  Result Date: 09/08/2017 CLINICAL DATA:  Chest pain and shortness of breath EXAM: PORTABLE CHEST 1 VIEW COMPARISON:  September 08, 2017 FINDINGS: There is no edema or consolidation. There is cardiomegaly with pulmonary venous hypertension. No adenopathy. No evident bone lesions. IMPRESSION: Pulmonary vascular congestion.  No edema or consolidation. Electronically Signed   By: Bretta Bang III M.D.   On: 09/08/2017 13:29    EKG: Independently reviewed. Afib 96bpm.  Assessment/Plan Principal Problem:   Acute on chronic systolic CHF (congestive heart failure) (HCC) Active Problems:   Essential hypertension   Asthma   GERD (gastroesophageal reflux disease)   Atrial fibrillation, chronic (HCC)   Elevated troponin    1. Acute hypoxemic respiratory failure secondary to acute on chronic systolic congestive heart failure with volume overload.  Patient will require aggressive diuresis with Lasix 80 mg 3 times daily as he usually takes Bumex 2 mg 3 times daily at home.  Daily weights as well as strict I's and O's.  Wean oxygen as tolerated.  Hold home Bumex and maintain on spironolactone.  Maintain on fluid restriction with cardiac diet.  Wears oxygen intermittently at home, but is needing it constantly here. 2. Elevated troponin.  Likely secondary to above.  Monitor and trend. 3. Mild hypokalemia.  Replete and recheck mag in a.m. 4. Chronic atrial  fibrillation-rate controlled.  Maintain on home Xarelto and monitor on telemetry.  Continue on Coreg. 5. History of asthma.  No acute bronchospasm is currently noted but will place on Xopenex/atrovent as needed. 6. History of OSA.  Does not wear CPAP at night.  He does wear oxygen at night. 7. Hypertension.  Currently elevated.  Maintain an aggressive diuresis as well as hydralazine as needed. 8. Anemia.  Appears to be stable and has component of iron deficiency for which I will continue ferrous gluconate. 9. GERD.  Continue Protonix. 10. Dyslipidemia.  Continue statin.   DVT prophylaxis: Xarelto Code Status: DNR Family Communication: Mother at bedside Disposition Plan: Aggressive diuresis until euvolemic and symptomatically improved Consults called: None Admission status: Observation, tele   Samuel Blinks DO [facility] Pager [phone]  If 7PM-7AM, please contact night-coverage [email or web address] Password [facility]  09/08/2017, 3:36 PM

## 2017-09-08 NOTE — ED Notes (Signed)
Patient transported to X-ray 

## 2017-09-08 NOTE — ED Notes (Signed)
Date and time results received: 09/08/17 1452   Test: Tpn Critical Value: 0.04  Name of Provider Notified: [place]  Orders Received? Or Actions Taken?: N/A

## 2017-09-08 NOTE — ED Triage Notes (Signed)
Pt here yesterday for same, pt reports has SOB and continues to have bilateral ankle and leg edema, pt reports 1L decrease in fluid since last night and reports has been taking Lasix as prescribed, per [facility] PVCs on monitor, 100% on 3L [place], BP 186/107, HR 95

## 2017-09-09 DIAGNOSIS — I482 Chronic atrial fibrillation: Secondary | ICD-10-CM | POA: Diagnosis not present

## 2017-09-09 DIAGNOSIS — Z8249 Family history of ischemic heart disease and other diseases of the circulatory system: Secondary | ICD-10-CM | POA: Diagnosis not present

## 2017-09-09 DIAGNOSIS — E876 Hypokalemia: Secondary | ICD-10-CM | POA: Diagnosis present

## 2017-09-09 DIAGNOSIS — T502X5A Adverse effect of carbonic-anhydrase inhibitors, benzothiadiazides and other diuretics, initial encounter: Secondary | ICD-10-CM | POA: Diagnosis not present

## 2017-09-09 DIAGNOSIS — J9601 Acute respiratory failure with hypoxia: Secondary | ICD-10-CM | POA: Diagnosis present

## 2017-09-09 DIAGNOSIS — I5043 Acute on chronic combined systolic (congestive) and diastolic (congestive) heart failure: Secondary | ICD-10-CM | POA: Diagnosis present

## 2017-09-09 DIAGNOSIS — Z66 Do not resuscitate: Secondary | ICD-10-CM | POA: Diagnosis present

## 2017-09-09 DIAGNOSIS — G4733 Obstructive sleep apnea (adult) (pediatric): Secondary | ICD-10-CM | POA: Diagnosis present

## 2017-09-09 DIAGNOSIS — I252 Old myocardial infarction: Secondary | ICD-10-CM | POA: Diagnosis not present

## 2017-09-09 DIAGNOSIS — Z6841 Body Mass Index (BMI) 40.0 and over, adult: Secondary | ICD-10-CM | POA: Diagnosis not present

## 2017-09-09 DIAGNOSIS — D509 Iron deficiency anemia, unspecified: Secondary | ICD-10-CM | POA: Diagnosis present

## 2017-09-09 DIAGNOSIS — M1990 Unspecified osteoarthritis, unspecified site: Secondary | ICD-10-CM | POA: Diagnosis present

## 2017-09-09 DIAGNOSIS — E785 Hyperlipidemia, unspecified: Secondary | ICD-10-CM | POA: Diagnosis present

## 2017-09-09 DIAGNOSIS — Z7982 Long term (current) use of aspirin: Secondary | ICD-10-CM | POA: Diagnosis not present

## 2017-09-09 DIAGNOSIS — Z7951 Long term (current) use of inhaled steroids: Secondary | ICD-10-CM | POA: Diagnosis not present

## 2017-09-09 DIAGNOSIS — I428 Other cardiomyopathies: Secondary | ICD-10-CM | POA: Diagnosis present

## 2017-09-09 DIAGNOSIS — Z87891 Personal history of nicotine dependence: Secondary | ICD-10-CM | POA: Diagnosis not present

## 2017-09-09 DIAGNOSIS — K219 Gastro-esophageal reflux disease without esophagitis: Secondary | ICD-10-CM | POA: Diagnosis present

## 2017-09-09 DIAGNOSIS — I5023 Acute on chronic systolic (congestive) heart failure: Secondary | ICD-10-CM | POA: Diagnosis not present

## 2017-09-09 DIAGNOSIS — J45909 Unspecified asthma, uncomplicated: Secondary | ICD-10-CM | POA: Diagnosis present

## 2017-09-09 DIAGNOSIS — Z9981 Dependence on supplemental oxygen: Secondary | ICD-10-CM | POA: Diagnosis not present

## 2017-09-09 DIAGNOSIS — I110 Hypertensive heart disease with heart failure: Secondary | ICD-10-CM | POA: Diagnosis present

## 2017-09-09 DIAGNOSIS — R7989 Other specified abnormal findings of blood chemistry: Secondary | ICD-10-CM | POA: Diagnosis present

## 2017-09-09 LAB — BASIC METABOLIC PANEL
ANION GAP: 10 (ref 5–15)
BUN: 13 mg/dL (ref 6–20)
CALCIUM: 8.1 mg/dL — AB (ref 8.9–10.3)
CO2: 28 mmol/L (ref 22–32)
Chloride: 101 mmol/L (ref 101–111)
Creatinine, Ser: 1.14 mg/dL (ref 0.61–1.24)
Glucose, Bld: 93 mg/dL (ref 65–99)
Potassium: 3.3 mmol/L — ABNORMAL LOW (ref 3.5–5.1)
Sodium: 139 mmol/L (ref 135–145)

## 2017-09-09 LAB — CBC
HCT: 38.8 % — ABNORMAL LOW (ref 39.0–52.0)
HEMOGLOBIN: 12.2 g/dL — AB (ref 13.0–17.0)
MCH: 27.6 pg (ref 26.0–34.0)
MCHC: 31.4 g/dL (ref 30.0–36.0)
MCV: 87.8 fL (ref 78.0–100.0)
Platelets: 226 10*3/uL (ref 150–400)
RBC: 4.42 MIL/uL (ref 4.22–5.81)
RDW: 15.2 % (ref 11.5–15.5)
WBC: 5.5 10*3/uL (ref 4.0–10.5)

## 2017-09-09 LAB — MAGNESIUM: MAGNESIUM: 1.6 mg/dL — AB (ref 1.7–2.4)

## 2017-09-09 LAB — TROPONIN I: TROPONIN I: 0.04 ng/mL — AB (ref <0.03)

## 2017-09-09 MED ORDER — POTASSIUM CHLORIDE CRYS ER 20 MEQ PO TBCR
40.0000 meq | EXTENDED_RELEASE_TABLET | Freq: Once | ORAL | Status: AC
  Administered 2017-09-09: 40 meq via ORAL
  Filled 2017-09-09: qty 2

## 2017-09-09 NOTE — Care Management Note (Signed)
Case Management Note  Patient Details  Name: [name] MRN: [id number] Date of Birth: [birth year]                 Subjective/Objective:     Admitted with CHF. Pt lives with mother, non compliant with medications and diet.  Pt has home oxygen, is ind with ADL's. He has transportation to appointments. Patient has had multiple home health agencies in the past.  In October patient had agreed to [facility] and was enrolled, to receive IV lasix at home. Patient eventually revoked Hospice services. He is showing up on [place] registry. Discussed with patient, ordered [facility] case management services.  Patient has had 5 admission in 6 months and 2 ER visits.    Action/Plan: DC home with self care. Not eligible for Home health as he has been so non compliant in the past.   Expected Discharge Date:     unk             Expected Discharge Plan:  Home/Self Care  In-House Referral:     Discharge planning Services  CM Consult  Post Acute Care Choice:  NA Choice offered to:  NA  DME Arranged:    DME Agency:     HH Arranged:    HH Agency:     Status of Service:  Completed, signed off  If discussed at [employer] of Stay Meetings, dates discussed:    Additional Comments:  Georgana Romain, [name], RN 09/09/2017, 1:11 PM

## 2017-09-09 NOTE — Progress Notes (Signed)
PROGRESS NOTE    [name]  OXB:[id number] DOB: 1966-05-28 DOA: 09/08/2017 PCP: Pearson Grippe, MD     Brief Narrative:  52 year old man admitted from home on 3/20 due to shortness of breath and lower extremity edema.  He is well-known to [place] for frequent admissions for acute on chronic systolic heart failure due to known dietary and medication noncompliance.  Admission is requested.   Assessment & Plan:   Principal Problem:   Acute on chronic systolic CHF (congestive heart failure) (HCC) Active Problems:   Essential hypertension   Asthma   GERD (gastroesophageal reflux disease)   Atrial fibrillation, chronic (HCC)   Elevated troponin   Acute on chronic systolic (congestive) heart failure (HCC)   CHF (congestive heart failure) (HCC)   Acute on chronic combined heart failure -2D echo from November 2018 is reviewed: Ejection fraction of 55% with akinesis of the basal inferior myocardium, not technically sufficient to allow for LV diastolic dysfunction although there was moderate concentric hypertrophy.  This ejection fraction is actually improved from his last echo in May 2018 at which time his EF was 35-40% -Continue Lasix at current dose of 80 mg IV every 8 hours, he has diuresed 3.5 L since admission. -Creatinine with slight increase from 1.1-1.14, monitor closely. -Continue Coreg, lisinopril, spironolactone.  Acute hypoxemic respiratory failure -Secondary to acute on chronic combined heart failure. -Suspect will be able to titrate oxygen as we continue to diurese him. -Please see above for details.  Elevated troponin -Flat in the range of 0.04, no chest pain, no acute EKG changes. - likely secondary to acute heart failure exacerbation. -No further cardiac workup planned for at present.  Chronic atrial fibrillation -Rate controlled on Coreg. -Continue Xarelto for anticoagulation.  Obstructive sleep apnea -Refuses nightly CPAP.  GERD -Continue  Protonix  Hyperlipidemia -Continue statin  History of asthma -Well compensated at present, continue Atrovent as needed.  Mild hypokalemia/hypomagnesemia -Potassium today is 3.3, likely secondary to diuresis. -Replace potassium, mag is also low at 1.6, will replace.   DVT prophylaxis: Xarelto Code Status: DNR Family Communication: Patient only Disposition Plan: Home when ready  Consultants:   None  Procedures:   None  Antimicrobials:  Anti-infectives (From admission, onward)   None       Subjective: Feels improved, less short of breath, denies chest pain  Objective: Vitals:   09/09/17 0739 09/09/17 0830 09/09/17 0900 09/09/17 1415  BP:  123/82  116/70  Pulse:  92  86  Resp:  18  (!) 22  Temp:  98.2 F (36.8 C)  97.6 F (36.4 C)  TempSrc:  Oral  Oral  SpO2: 97% 92% 97% 99%  Weight:      Height:        Intake/Output Summary (Last 24 hours) at 09/09/2017 1509 Last data filed at 09/09/2017 1355 Gross per 24 hour  Intake 960 ml  Output 4880 ml  Net -3920 ml   Filed Weights   09/08/17 1301 09/08/17 1902 09/09/17 0500  Weight: (!) 156.5 kg (345 lb) (!) 169 kg (372 lb 8 oz) (!) 169 kg (372 lb 8 oz)    Examination:  General exam: Alert, awake, oriented x 3 Respiratory system: Mild bibasilar crackles Cardiovascular system:RRR. No murmurs, rubs, gallops. Gastrointestinal system: Abdomen is nondistended, soft and nontender. No organomegaly or masses felt. Normal bowel sounds heard. Central nervous system: Alert and oriented. No focal neurological deficits. Extremities: 3+ pitting edema bilaterally up to knees, positive pedal pulses Skin: No rashes, lesions or  ulcers Psychiatry: Judgement and insight appear normal. Mood & affect appropriate.     Data Reviewed: I have personally reviewed following labs and imaging studies  CBC: Recent Labs  Lab 09/08/17 0141 09/08/17 1340 09/09/17 0419  WBC 5.3 4.7 5.5  NEUTROABS 3.7 3.2  --   HGB 12.9* 13.2 12.2*   HCT 40.4 41.9 38.8*  MCV 87.1 88.4 87.8  PLT 223 205 226   Basic Metabolic Panel: Recent Labs  Lab 09/08/17 0141 09/08/17 1340 09/09/17 0419  NA 141 140 139  K 3.5 3.1* 3.3*  CL 102 100* 101  CO2 27 28 28   GLUCOSE 97 90 93  BUN 12 13 13   CREATININE 1.10 1.10 1.14  CALCIUM 8.7* 8.5* 8.1*  MG  --   --  1.6*   GFR: Estimated Creatinine Clearance: 126.6 mL/min (by C-G formula based on SCr of 1.14 mg/dL). Liver Function Tests: Recent Labs  Lab 09/08/17 1340  AST 25  ALT 18  ALKPHOS 78  BILITOT 1.3*  PROT 7.2  ALBUMIN 3.7   No results for input(s): LIPASE, AMYLASE in the last 168 hours. No results for input(s): AMMONIA in the last 168 hours. Coagulation Profile: No results for input(s): INR, PROTIME in the last 168 hours. Cardiac Enzymes: Recent Labs  Lab 09/08/17 1340 09/08/17 1559 09/08/17 2119 09/09/17 0419  TROPONINI 0.04* 0.04* 0.04* 0.04*   BNP (last 3 results) No results for input(s): PROBNP in the last 8760 hours. HbA1C: No results for input(s): HGBA1C in the last 72 hours. CBG: No results for input(s): GLUCAP in the last 168 hours. Lipid Profile: No results for input(s): CHOL, HDL, LDLCALC, TRIG, CHOLHDL, LDLDIRECT in the last 72 hours. Thyroid Function Tests: No results for input(s): TSH, T4TOTAL, FREET4, T3FREE, THYROIDAB in the last 72 hours. Anemia Panel: No results for input(s): VITAMINB12, FOLATE, FERRITIN, TIBC, IRON, RETICCTPCT in the last 72 hours. Urine analysis:    Component Value Date/Time   COLORURINE STRAW (A) 05/11/2017 0302   APPEARANCEUR CLEAR 05/11/2017 0302   LABSPEC 1.006 05/11/2017 0302   PHURINE 6.0 05/11/2017 0302   GLUCOSEU NEGATIVE 05/11/2017 0302   GLUCOSEU NEG mg/dL 16/03/9603 5409   HGBUR NEGATIVE 05/11/2017 0302   BILIRUBINUR NEGATIVE 05/11/2017 0302   KETONESUR NEGATIVE 05/11/2017 0302   PROTEINUR NEGATIVE 05/11/2017 0302   UROBILINOGEN 0.2 08/08/2014 1445   NITRITE NEGATIVE 05/11/2017 0302   LEUKOCYTESUR  NEGATIVE 05/11/2017 0302   Sepsis Labs: @LABRCNTIP (procalcitonin:4,lacticidven:4)  )No results found for this or any previous visit (from the past 240 hour(s)).       Radiology Studies: Dg Chest 2 View  Result Date: 09/08/2017 CLINICAL DATA:  Shortness of breath and mid chest pain. EXAM: CHEST - 2 VIEW COMPARISON:  Radiograph 09/03/2017 at [facility] FINDINGS: Cardiomegaly is unchanged. Stable pulmonary vasculature with vascular congestion. No confluent airspace disease, pleural effusion or pneumothorax. Soft tissue attenuation from habitus limits assessment. IMPRESSION: Unchanged cardiomegaly and vascular congestion. No acute abnormality. Electronically Signed   By: Rubye Oaks M.D.   On: 09/08/2017 02:41   Dg Chest Portable 1 View  Result Date: 09/08/2017 CLINICAL DATA:  Chest pain and shortness of breath EXAM: PORTABLE CHEST 1 VIEW COMPARISON:  September 08, 2017 FINDINGS: There is no edema or consolidation. There is cardiomegaly with pulmonary venous hypertension. No adenopathy. No evident bone lesions. IMPRESSION: Pulmonary vascular congestion.  No edema or consolidation. Electronically Signed   By: Bretta Bang III M.D.   On: 09/08/2017 13:29  Scheduled Meds: . aspirin  81 mg Oral Daily  . atorvastatin  40 mg Oral q1800  . budesonide (PULMICORT) nebulizer solution  0.25 mg Nebulization BID  . carvedilol  25 mg Oral BID WC  . ferrous gluconate  324 mg Oral Q breakfast  . furosemide  80 mg Intravenous Q8H  . lisinopril  5 mg Oral Daily  . pantoprazole  40 mg Oral QPM  . potassium chloride SA  20 mEq Oral Daily  . rivaroxaban  20 mg Oral Q supper  . sodium chloride flush  3 mL Intravenous Q12H  . spironolactone  50 mg Oral Daily  . tamsulosin  0.4 mg Oral Daily   Continuous Infusions: . sodium chloride       LOS: 0 days    Time spent: 35 minutes. Greater than 50% of this time was spent in direct contact with the patient coordinating  care.     Chaya Jan, MD [facility] Pager [phone]  If 7PM-7AM, please contact night-coverage [email or web address] Password [facility] 09/09/2017, 3:09 PM

## 2017-09-10 ENCOUNTER — Other Ambulatory Visit: Payer: Self-pay | Admitting: *Deleted

## 2017-09-10 LAB — BASIC METABOLIC PANEL
ANION GAP: 8 (ref 5–15)
BUN: 19 mg/dL (ref 6–20)
CO2: 28 mmol/L (ref 22–32)
CREATININE: 1.21 mg/dL (ref 0.61–1.24)
Calcium: 8.7 mg/dL — ABNORMAL LOW (ref 8.9–10.3)
Chloride: 105 mmol/L (ref 101–111)
GFR calc Af Amer: >60 mL/min (ref >60)
GFR calc non Af Amer: >60 mL/min (ref >60)
GLUCOSE: 88 mg/dL (ref 65–99)
Potassium: 3.4 mmol/L — ABNORMAL LOW (ref 3.5–5.1)
Sodium: 141 mmol/L (ref 135–145)

## 2017-09-10 LAB — GLUCOSE, CAPILLARY: Glucose-Capillary: 78 mg/dL (ref 65–99)

## 2017-09-10 MED ORDER — MAGNESIUM SULFATE 2 GM/50ML IV SOLN
2.0000 g | Freq: Once | INTRAVENOUS | Status: AC
  Administered 2017-09-10: 2 g via INTRAVENOUS
  Filled 2017-09-10: qty 50

## 2017-09-10 MED ORDER — POTASSIUM CHLORIDE CRYS ER 20 MEQ PO TBCR
40.0000 meq | EXTENDED_RELEASE_TABLET | Freq: Once | ORAL | Status: AC
  Administered 2017-09-10: 40 meq via ORAL
  Filled 2017-09-10: qty 2

## 2017-09-10 MED ORDER — FUROSEMIDE 10 MG/ML IJ SOLN
80.0000 mg | Freq: Two times a day (BID) | INTRAMUSCULAR | Status: DC
  Administered 2017-09-10 – 2017-09-11 (×3): 80 mg via INTRAVENOUS
  Filled 2017-09-10 (×4): qty 8

## 2017-09-10 NOTE — Progress Notes (Signed)
PROGRESS NOTE    [name]  ZOX:[id number] DOB: 1966-05-26 DOA: 09/08/2017 PCP: Pearson Grippe, MD     Brief Narrative:  52 year old man admitted from home on 3/20 due to shortness of breath and lower extremity edema.  He is well-known to [place] for frequent admissions for acute on chronic systolic heart failure due to known dietary and medication noncompliance.  Admission is requested.   Assessment & Plan:   Principal Problem:   Acute on chronic systolic CHF (congestive heart failure) (HCC) Active Problems:   Essential hypertension   Asthma   GERD (gastroesophageal reflux disease)   Atrial fibrillation, chronic (HCC)   Elevated troponin   Acute on chronic systolic (congestive) heart failure (HCC)   CHF (congestive heart failure) (HCC)   Acute on chronic combined heart failure -2D echo from November 2018 is reviewed: Ejection fraction of 55% with akinesis of the basal inferior myocardium, not technically sufficient to allow for LV diastolic dysfunction although there was moderate concentric hypertrophy.  This ejection fraction is actually improved from his last echo in May 2018 at which time his EF was 35-40% -Lasix dose has been decreased from 80 mg every 8 hours to every 12 hours due to rising creatinine, he remains hypervolemic on exam. -He is now 6.8 L negative since admission. -Creatinine with slight increase from 1.1-1.2, monitor closely. -Continue Coreg, lisinopril, spironolactone.  Acute hypoxemic respiratory failure -Secondary to acute on chronic combined heart failure. -Now on room air with oxygen saturations of 100%. -Please see above for details.  Elevated troponin -Flat in the range of 0.04, no chest pain, no acute EKG changes. - likely secondary to acute heart failure exacerbation. -No further cardiac workup planned for at present.  Chronic atrial fibrillation -Rate controlled on Coreg. -Continue Xarelto for anticoagulation.  Obstructive sleep apnea -Refuses  nightly CPAP.  GERD -Continue Protonix  Hyperlipidemia -Continue statin  History of asthma -Well compensated at present, continue Atrovent as needed.  Mild hypokalemia/hypomagnesemia -Potassium today is 3.4, likely secondary to diuresis. -Recheck Mg levels in am. -Give IV mag sulfate.   DVT prophylaxis: Xarelto Code Status: DNR Family Communication: Patient only Disposition Plan: Home when ready anticipate 48-72 hours  Consultants:   None  Procedures:   None  Antimicrobials:  Anti-infectives (From admission, onward)   None       Subjective: Sitting in chair at bedside, feels less short of breath, states his legs still feel tight, denies chest pain.  Objective: Vitals:   09/09/17 1939 09/09/17 2100 09/10/17 0500 09/10/17 0759  BP:  115/75 120/82   Pulse:  85 82   Resp:  20 20   Temp:  97.8 F (36.6 C) 98 F (36.7 C)   TempSrc:  Oral Oral   SpO2: 95% 95% 98% 100%  Weight:   (!) 162 kg (357 lb 3.2 oz)   Height:        Intake/Output Summary (Last 24 hours) at 09/10/2017 1315 Last data filed at 09/10/2017 1258 Gross per 24 hour  Intake 240 ml  Output 3880 ml  Net -3640 ml   Filed Weights   09/08/17 1902 09/09/17 0500 09/10/17 0500  Weight: (!) 169 kg (372 lb 8 oz) (!) 169 kg (372 lb 8 oz) (!) 162 kg (357 lb 3.2 oz)    Examination:  General exam: Alert, awake, oriented x 3 Respiratory system: Minimal bibasilar crackles Cardiovascular system:RRR. No murmurs, rubs, gallops. Gastrointestinal system: Abdomen is nondistended, soft and nontender. No organomegaly or masses felt. Normal bowel  sounds heard. Central nervous system: Alert and oriented. No focal neurological deficits. Extremities: 2-3+ pitting edema bilaterally Skin: No rashes, lesions or ulcers Psychiatry: Judgement and insight appear normal. Mood & affect appropriate.      Data Reviewed: I have personally reviewed following labs and imaging studies  CBC: Recent Labs  Lab  09/08/17 0141 09/08/17 1340 09/09/17 0419  WBC 5.3 4.7 5.5  NEUTROABS 3.7 3.2  --   HGB 12.9* 13.2 12.2*  HCT 40.4 41.9 38.8*  MCV 87.1 88.4 87.8  PLT 223 205 226   Basic Metabolic Panel: Recent Labs  Lab 09/08/17 0141 09/08/17 1340 09/09/17 0419 09/10/17 0614  NA 141 140 139 141  K 3.5 3.1* 3.3* 3.4*  CL 102 100* 101 105  CO2 27 28 28 28   GLUCOSE 97 90 93 88  BUN 12 13 13 19   CREATININE 1.10 1.10 1.14 1.21  CALCIUM 8.7* 8.5* 8.1* 8.7*  MG  --   --  1.6*  --    GFR: Estimated Creatinine Clearance: 116.4 mL/min (by C-G formula based on SCr of 1.21 mg/dL). Liver Function Tests: Recent Labs  Lab 09/08/17 1340  AST 25  ALT 18  ALKPHOS 78  BILITOT 1.3*  PROT 7.2  ALBUMIN 3.7   No results for input(s): LIPASE, AMYLASE in the last 168 hours. No results for input(s): AMMONIA in the last 168 hours. Coagulation Profile: No results for input(s): INR, PROTIME in the last 168 hours. Cardiac Enzymes: Recent Labs  Lab 09/08/17 1340 09/08/17 1559 09/08/17 2119 09/09/17 0419  TROPONINI 0.04* 0.04* 0.04* 0.04*   BNP (last 3 results) No results for input(s): PROBNP in the last 8760 hours. HbA1C: No results for input(s): HGBA1C in the last 72 hours. CBG: Recent Labs  Lab 09/10/17 0744  GLUCAP 78   Lipid Profile: No results for input(s): CHOL, HDL, LDLCALC, TRIG, CHOLHDL, LDLDIRECT in the last 72 hours. Thyroid Function Tests: No results for input(s): TSH, T4TOTAL, FREET4, T3FREE, THYROIDAB in the last 72 hours. Anemia Panel: No results for input(s): VITAMINB12, FOLATE, FERRITIN, TIBC, IRON, RETICCTPCT in the last 72 hours. Urine analysis:    Component Value Date/Time   COLORURINE STRAW (A) 05/11/2017 0302   APPEARANCEUR CLEAR 05/11/2017 0302   LABSPEC 1.006 05/11/2017 0302   PHURINE 6.0 05/11/2017 0302   GLUCOSEU NEGATIVE 05/11/2017 0302   GLUCOSEU NEG mg/dL 57/47/3403 7096   HGBUR NEGATIVE 05/11/2017 0302   BILIRUBINUR NEGATIVE 05/11/2017 0302   KETONESUR  NEGATIVE 05/11/2017 0302   PROTEINUR NEGATIVE 05/11/2017 0302   UROBILINOGEN 0.2 08/08/2014 1445   NITRITE NEGATIVE 05/11/2017 0302   LEUKOCYTESUR NEGATIVE 05/11/2017 0302   Sepsis Labs: @LABRCNTIP (procalcitonin:4,lacticidven:4)  )No results found for this or any previous visit (from the past 240 hour(s)).       Radiology Studies: Dg Chest Portable 1 View  Result Date: 09/08/2017 CLINICAL DATA:  Chest pain and shortness of breath EXAM: PORTABLE CHEST 1 VIEW COMPARISON:  September 08, 2017 FINDINGS: There is no edema or consolidation. There is cardiomegaly with pulmonary venous hypertension. No adenopathy. No evident bone lesions. IMPRESSION: Pulmonary vascular congestion.  No edema or consolidation. Electronically Signed   By: Bretta Bang III M.D.   On: 09/08/2017 13:29        Scheduled Meds: . aspirin  81 mg Oral Daily  . atorvastatin  40 mg Oral q1800  . budesonide (PULMICORT) nebulizer solution  0.25 mg Nebulization BID  . carvedilol  25 mg Oral BID WC  . ferrous gluconate  324  mg Oral Q breakfast  . furosemide  80 mg Intravenous Q12H  . lisinopril  5 mg Oral Daily  . pantoprazole  40 mg Oral QPM  . potassium chloride SA  20 mEq Oral Daily  . rivaroxaban  20 mg Oral Q supper  . sodium chloride flush  3 mL Intravenous Q12H  . spironolactone  50 mg Oral Daily  . tamsulosin  0.4 mg Oral Daily   Continuous Infusions: . sodium chloride       LOS: 1 day    Time spent: 25 minutes. Greater than 50% of this time was spent in direct contact with the patient coordinating care.     Chaya Jan, MD [facility] Pager [phone]  If 7PM-7AM, please contact night-coverage [email or web address] Password [facility] 09/10/2017, 1:15 PM

## 2017-09-10 NOTE — Consult Note (Signed)
   [facility] CM Inpatient Consult   09/10/2017  [name] February 25, 1966 [id number]   Referral received from inpatient case manager for [facility] Care Management services and post hospital discharge follow up related to a diagnosis of HF and 5 admits in 6 months. Patient was evaluated for community based chronic disease management services with [facility] care Management Program as a benefit of patient's [employer]. Called into patient's inpatient room to explain [facility] Care Management services. Patient denies transportation issues. Patient also denies affordability issues with medications but was interested in speaking with a [facility] pharmacist related to questions about his medications. Verbal consent received. Patient gave [phone] as the best number to reach him. Patient will receive post hospital discharge calls and be evaluated for monthly home visits. Patient will also receive a call from [facility] Pharmacist. [facility] Care Management services does not interfere with or replace any services arranged by the inpatient care management team.  Made inpatient RNCM aware  [facility] will be following for care management. For additional questions please contact:   Maire Govan RN, BSN [facility] Liaison  [phone]) Business Mobile [phone]) Toll free office

## 2017-09-10 NOTE — Patient Outreach (Signed)
[facility]) Care Management  09/10/2017  [name] 09-Apr-1966 [id number]   Care coordination  Referral received from referral from [facility] CM for this patient Cm called the contact number for him and a male family member reports he remains hospitalized  [facility] CM sent message to hospital liaison to make attempt to see Mr Marchi while he remains hospitalized  This Cm will make another attempt to contact him once he is discharged   [place] L. Noelle Penner, RN, BSN, CCM [facility] Care Management Care Coordinator [phone] week day mobile

## 2017-09-11 DIAGNOSIS — I482 Chronic atrial fibrillation: Secondary | ICD-10-CM

## 2017-09-11 LAB — BASIC METABOLIC PANEL
ANION GAP: 11 (ref 5–15)
BUN: 22 mg/dL — AB (ref 6–20)
CALCIUM: 9 mg/dL (ref 8.9–10.3)
CO2: 27 mmol/L (ref 22–32)
Chloride: 106 mmol/L (ref 101–111)
Creatinine, Ser: 1.22 mg/dL (ref 0.61–1.24)
GFR calc Af Amer: >60 mL/min (ref >60)
GLUCOSE: 85 mg/dL (ref 65–99)
POTASSIUM: 3.9 mmol/L (ref 3.5–5.1)
Sodium: 144 mmol/L (ref 135–145)

## 2017-09-11 LAB — GLUCOSE, CAPILLARY: Glucose-Capillary: 81 mg/dL (ref 65–99)

## 2017-09-11 LAB — MAGNESIUM: Magnesium: 2.2 mg/dL (ref 1.7–2.4)

## 2017-09-11 IMAGING — CT CT ABD-PELV W/ CM
2 of 5 series · 16 of 46 positions shown, 18 images · IV contrast (iopamidol)
Comparison: None.

CLINICAL DATA: Bilateral lower extremity swelling starting 3 days
ago. Left lower quadrant pain.

EXAM:
CT ABDOMEN AND PELVIS WITH CONTRAST
TECHNIQUE: Multidetector CT imaging of the abdomen and pelvis was performed
using the standard protocol following bolus administration of
intravenous contrast.
CONTRAST:  100mL MYFG3M-TAA IOPAMIDOL (MYFG3M-TAA) INJECTION 61%

[Series 2: routine abd pel with · axial · 0.77mm/px · z∈[+562,+968]mm · 13 of 93 slices shown, 15 images]
[im 6/93  soft-tissue]
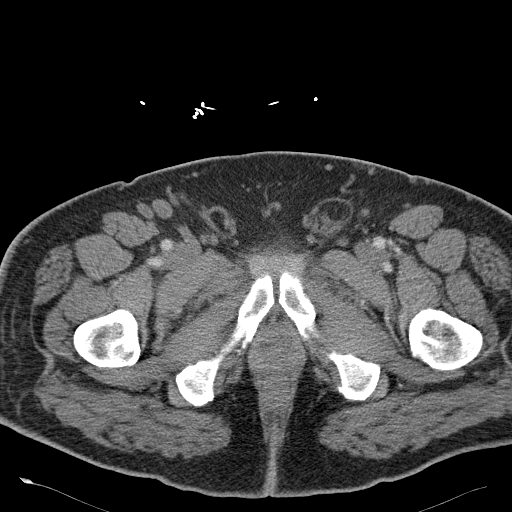
[im 6/93  bone]
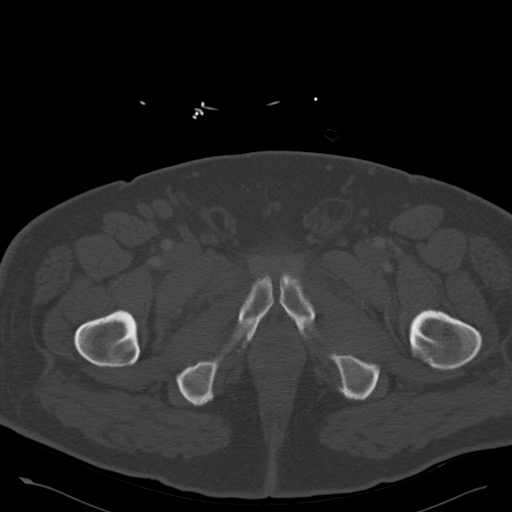
[im 11/93  soft-tissue]
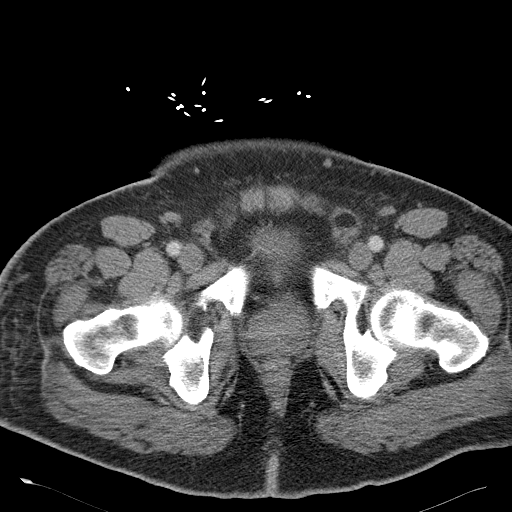
[im 22/93  soft-tissue]
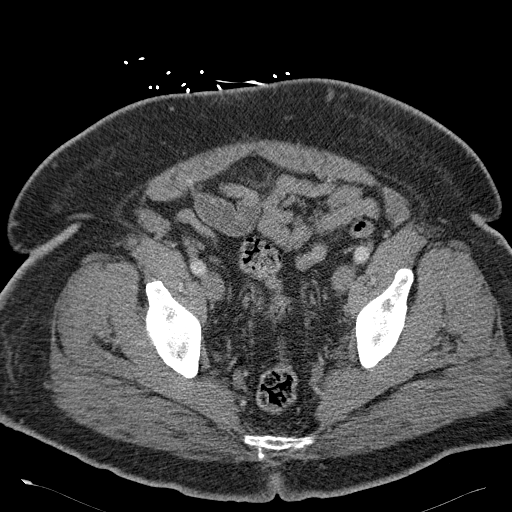
[im 28/93  soft-tissue]
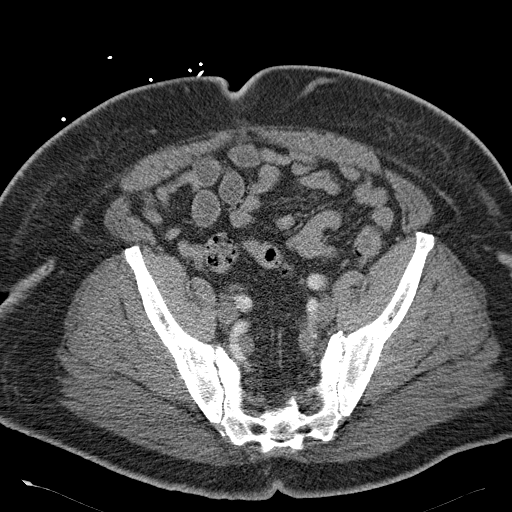
[im 33/93  soft-tissue]
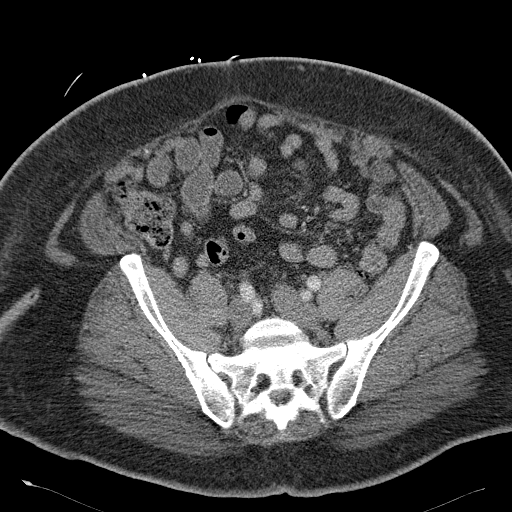
[im 38/93  soft-tissue]
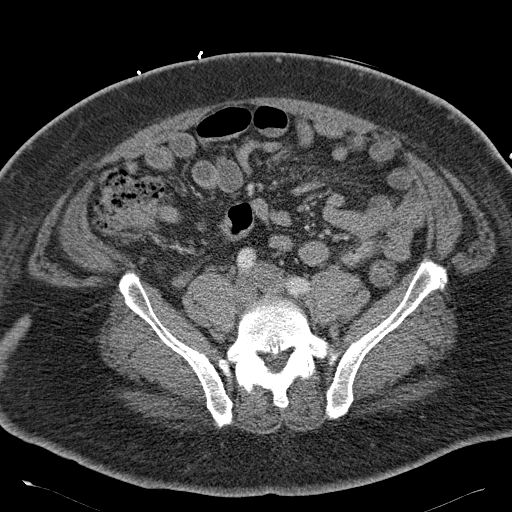
[im 49/93  soft-tissue]
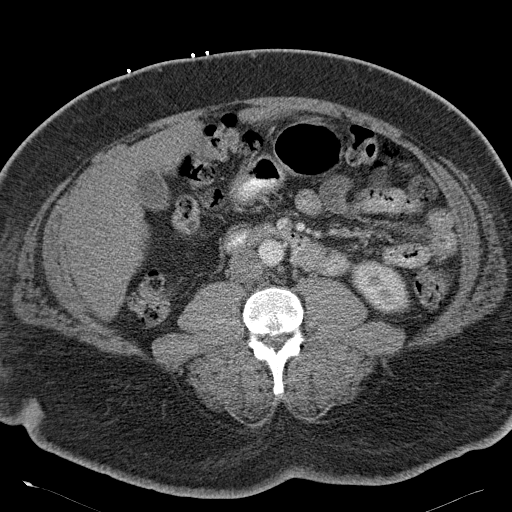
[im 55/93  soft-tissue]
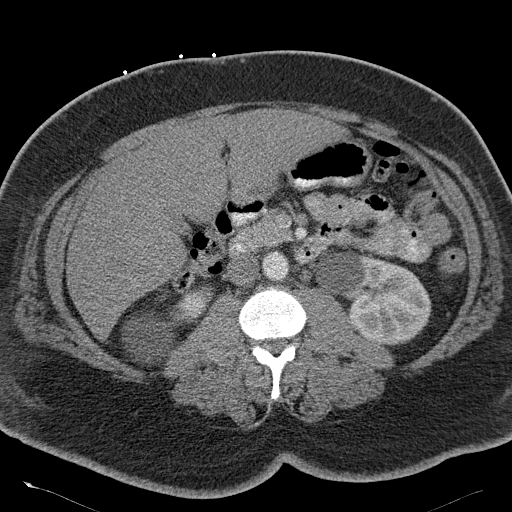
[im 60/93  soft-tissue]
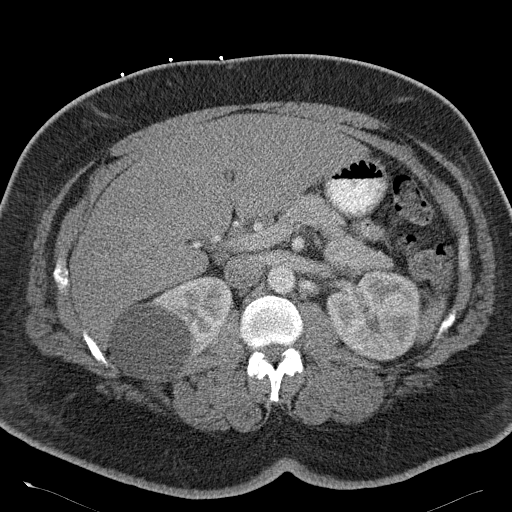
[im 60/93  bone]
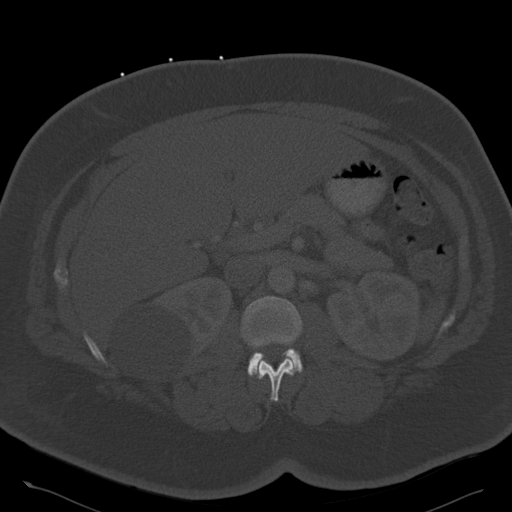
[im 65/93  soft-tissue]
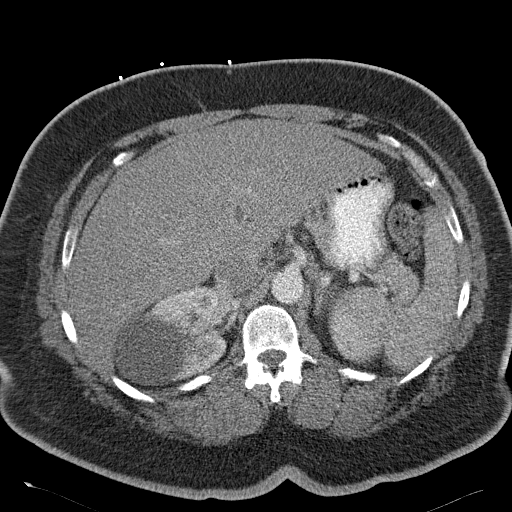
[im 71/93  soft-tissue]
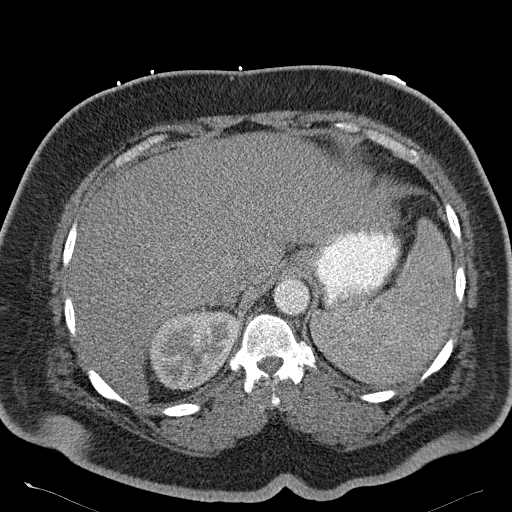
[im 82/93  soft-tissue]
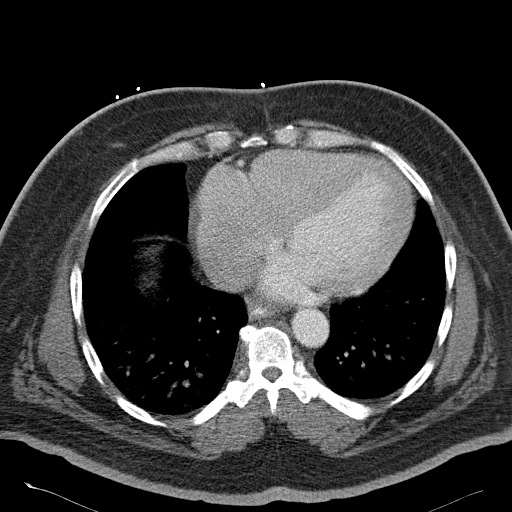
[im 87/93  soft-tissue]
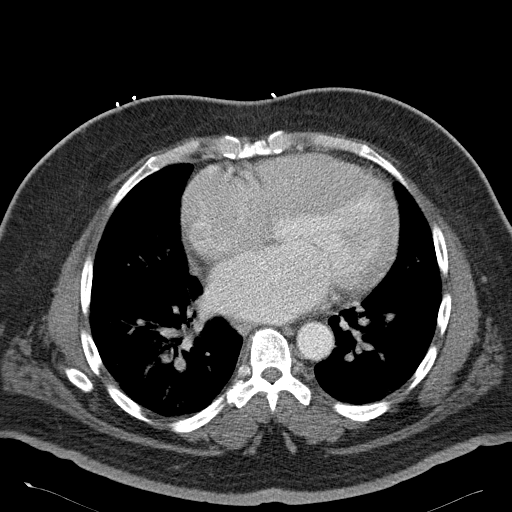

[Series 3: coronal · coronal · 0.78mm/px · 3 of 176 slices shown]
[im 59/176  soft-tissue]
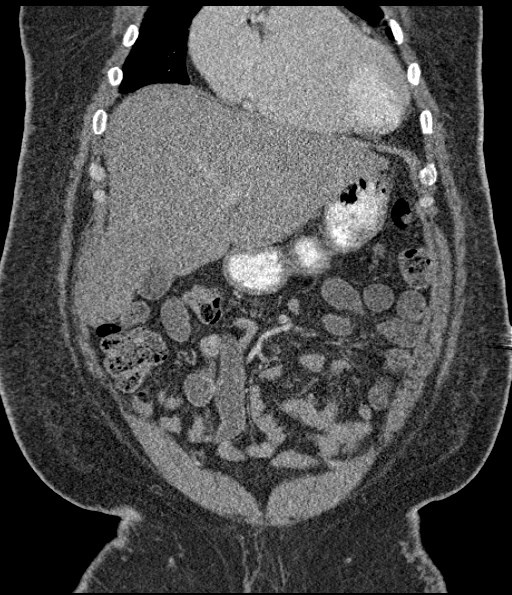
[im 78/176  soft-tissue]
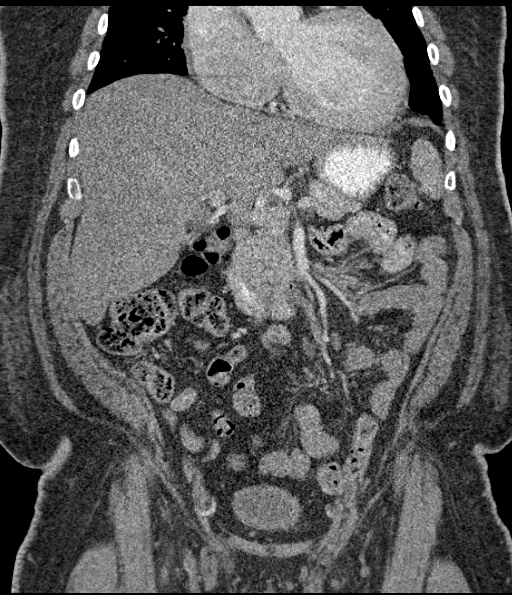
[im 98/176  soft-tissue]
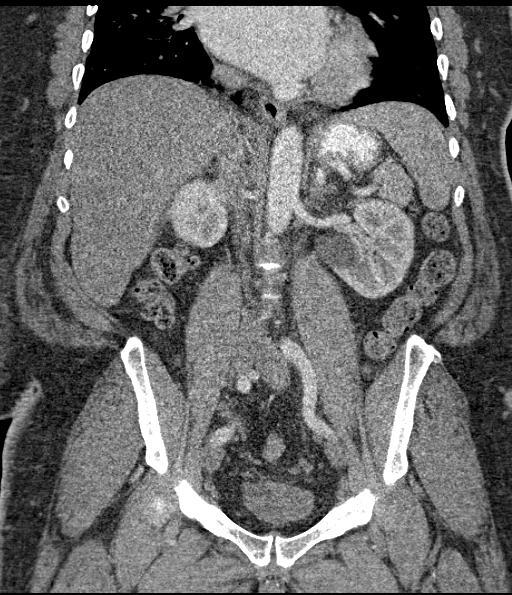

[16 of 46 positions shown; findings below may reference images not displayed]

FINDINGS: Lower chest: Patchy airspace disease in both lung bases suggesting
probable edema. Heart size is enlarged. Residual contrast material
in the esophagus may represent reflux or dysmotility.

Hepatobiliary: No masses or other significant abnormality.

Pancreas: No mass, inflammatory changes, or other significant
abnormality.

Spleen: Within normal limits in size and appearance.

Adrenals/Urinary Tract: No masses identified. No evidence of
hydronephrosis. Bilateral renal cysts.

Stomach/Bowel: No evidence of obstruction, inflammatory process, or
abnormal fluid collections.

Vascular/Lymphatic: No pathologically enlarged lymph nodes. No
evidence of abdominal aortic aneurysm.

Reproductive: No mass or other significant abnormality.

Other: None.

Musculoskeletal: No suspicious bone lesions identified. Schmorl's
node at T11.
IMPRESSION: Cardiac enlargement with airspace disease in both lung bases
suggesting heart failure with edema. No acute process demonstrated
in the abdomen or pelvis.

## 2017-09-11 IMAGING — DX DG CHEST 2V
2 series · 2 of 2 positions shown · non-contrast
Comparison: Chest radiograph dated 01/02/2016

CLINICAL DATA: 49-year-old male with edema and bilateral lower
extremity swelling.

EXAM:
CHEST  2 VIEW

[chest pa]
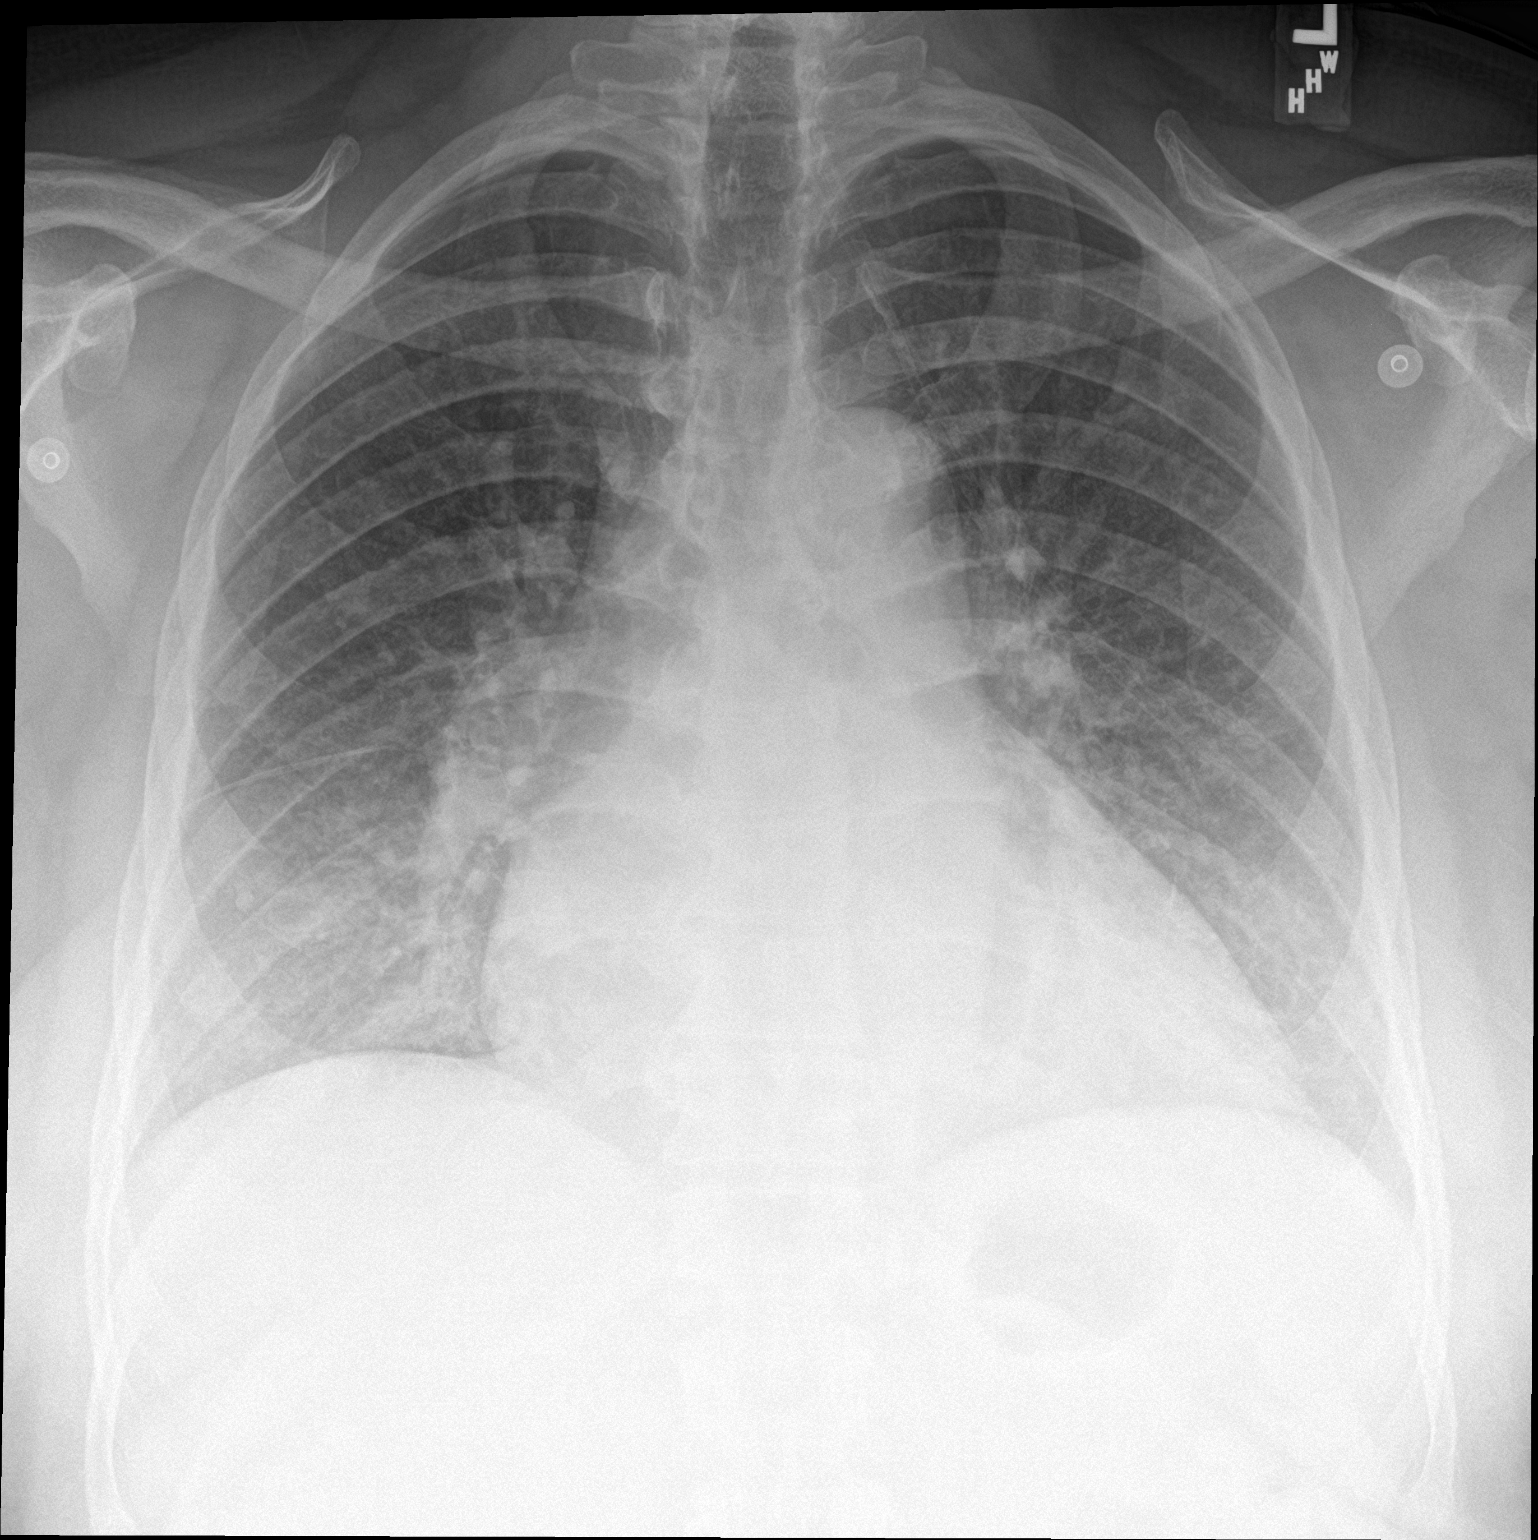

[chest lat]
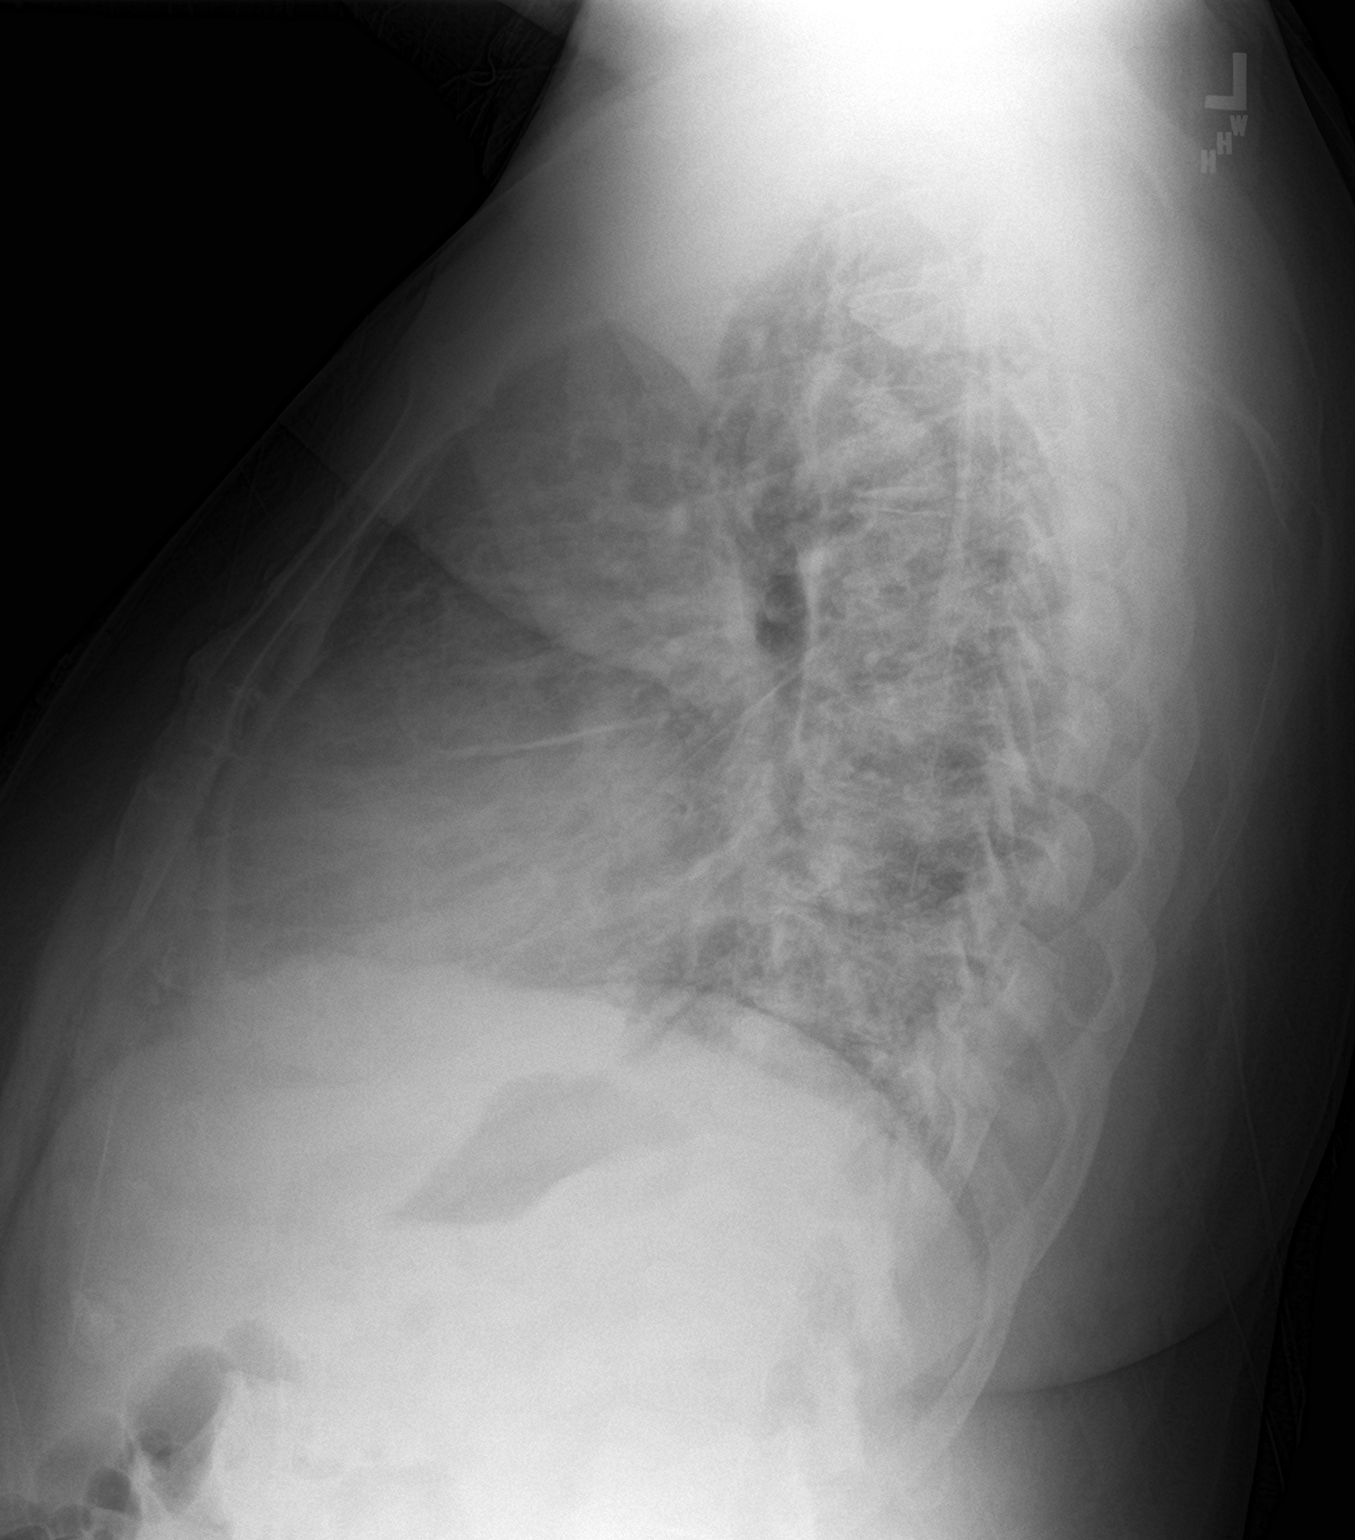

[2 of 2 positions shown; findings below may reference images not displayed]

FINDINGS: There is stable mild cardiomegaly. There is mild pulmonary vascular
congestion without edema. There is no focal consolidation, pleural
effusion, or pneumothorax. No acute osseous pathology.
IMPRESSION: Stable cardiomegaly with mild congestive changes. No focal
consolidation or edema.

## 2017-09-11 MED ORDER — FUROSEMIDE 40 MG PO TABS: 40.0000 mg | ORAL_TABLET | Freq: Two times a day (BID) | ORAL | 3 refills | Status: DC

## 2017-09-11 NOTE — Progress Notes (Signed)
IV discontinued,catheter intact. Discharge instructions given on medications, and follow up visits,patient verbalized understanding.Prescriptions sent with patient. Accompanied by staff to an awaiting vehicle.

## 2017-09-11 NOTE — Discharge Summary (Addendum)
Physician Discharge Summary  [name] ZMC:[id number] DOB: Jul 08, 1965 DOA: 09/08/2017  PCP: Pearson Grippe, MD  Admit date: 09/08/2017 Discharge date: 09/11/2017  Time spent: 45 minutes  Recommendations for Outpatient Follow-up:  -Will be discharged home today. -Advised to follow-up with cardiology in 2 weeks.  Discharge Diagnoses:  Principal Problem:   Acute on chronic systolic CHF (congestive heart failure) (HCC) Active Problems:   Essential hypertension   Asthma   GERD (gastroesophageal reflux disease)   Atrial fibrillation, chronic (HCC)   Elevated troponin   Acute on chronic systolic (congestive) heart failure (HCC)   CHF (congestive heart failure) (HCC)   Discharge Condition: Stable and improved  Filed Weights   09/09/17 0500 09/10/17 0500 09/11/17 0500  Weight: (!) 169 kg (372 lb 8 oz) (!) 162 kg (357 lb 3.2 oz) (!) 163 kg (359 lb 5.6 oz)    History of present illness:  As per Dr. Sherryll Burger on 3/20: [name] is a 52 y.o. male with medical history significant for systolic congestive heart failure with EF 55% on previous echo 04/2017, obesity, asthma, GERD, hypertension, anemia, and chronic atrial fibrillation on Xarelto who presented to the emergency department with severe lower extremity edema noted bilaterally as well as to his abdomen that has persisted since his emergency room visit last night.  He was noted to receive a one-time dose of 80 mg IV Lasix at that time and apparently diuresed, but not well enough and was still quite symptomatic when he was discharged home.  He states that he is watching his fluid intake as well as sodium intake and is very careful with his diet.  He takes Bumex 2 mg 3 times a day and is compliant with his medications. He states that he has worsening shortness of breath with laying flat. He had initial mild substernal chest pain with no radiation, but this is resolved. He has had a mild cough that has been nonproductive.  He denies fever  and chills. He denies any nausea vomiting or diaphoresis.   ED Course: Vital signs are noted to be stable and he is noted to be in atrial fibrillation on telemetry.  EKG with no concerning findings.  Chest x-ray notable for pulmonary vascular congestion.  Laboratory data with troponin 0 0.04, potassium 3.1.  And his creatinine appears to be at his baseline and is noted to be 1.1.  He has only received a one-time dose of IV Lasix 40 mg with no significant diuresis noted at this time.  He currently has to sit upright to prevent respiratory distress and is currently on 2 L nasal cannula.    Hospital Course:   Acute on chronic combined heart failure -2D echo from November 2018 is reviewed: Ejection fraction of 55% with akinesis of the basal inferior myocardium, not technically sufficient to allow for evaluation of LV diastolic function although there was moderate concentric hypertrophy.  This ejection fraction is actually improved from his last echo in May 2018 at which time his EF was 35-40% -He has 9.2 L negative since admission. -Appears euvolemic now. -Will Dc home on lasix 40 mg BID. -Continue Coreg, lisinopril, spironolactone.  Acute hypoxemic respiratory failure -Secondary to acute on chronic combined heart failure. -Now on room air with oxygen saturations of 100%. -Please see above for details.  Elevated troponin -Flat in the range of 0.04, no chest pain, no acute EKG changes. - likely secondary to acute heart failure exacerbation. -No further cardiac workup planned for at  present.  Chronic atrial fibrillation -Rate controlled on Coreg. -Continue Xarelto for anticoagulation.  Obstructive sleep apnea -Refuses nightly CPAP.  GERD -Continue Protonix  Hyperlipidemia -Continue statin  History of asthma -Well compensated at present, continue Atrovent as needed.  Mild hypokalemia/hypomagnesemia -Replaced. -K is 3.9 and Mg is 2.2 on DC.  Morbid  Obesity -noted  Procedures:  None   Consultations:  None  Discharge Instructions  Discharge Instructions    AMB Referral to [facility] Care Management   Complete by:  As directed    Please assign patient for community nurse to engage for transition of care calls and evaluate for monthly home visits. Please also assign [facility] pharmacist related to patient having questions about his medications. For questions please contact:   Janci Minor RN, BSN  [facility] Liaison [phone])   Reason for consult:  Post hospital follow up with [facility] manager and [facility] Pharmacist   Diagnoses of:  Heart Failure   Expected date of contact:  1-3 days (reserved for hospital discharges)   Diet - low sodium heart healthy   Complete by:  As directed    Increase activity slowly   Complete by:  As directed      Allergies as of 09/11/2017      Reactions   Banana Shortness Of Breath   Bee Venom Shortness Of Breath, Swelling, Other (See Comments)   Reaction:  Facial swelling   Food Shortness Of Breath, Rash, Other (See Comments)   Pt states that he is allergic to strawberries.     Aspirin Other (See Comments)   Reaction:  GI upset    Metolazone Other (See Comments)   Pt states that he stopped taking this med due to heart attack like symptoms.    Oatmeal Nausea And Vomiting   Orange Juice [orange Oil] Nausea And Vomiting   All acidic products make him nauseous and upset stomach   Torsemide Swelling, Other (See Comments)   Reaction:  Swelling of feet/legs    Diltiazem Palpitations   Hydralazine Palpitations   Lipitor [atorvastatin] Other (See Comments)   Reaction:  Nose bleeds       Medication List    STOP taking these medications   bumetanide 2 MG tablet Commonly known as:  BUMEX     TAKE these medications   albuterol (2.5 MG/3ML) 0.083% nebulizer solution Commonly known as:  PROVENTIL Take 2.5 mg by nebulization every 6 (six) hours as needed for wheezing or shortness of breath.    aspirin 81 MG chewable tablet Chew 1 tablet (81 mg total) by mouth daily.   atorvastatin 40 MG tablet Commonly known as:  LIPITOR Take 1 tablet (40 mg total) by mouth daily at 6 PM.   beclomethasone 80 MCG/ACT inhaler Commonly known as:  QVAR Inhale 2 puffs into the lungs 2 (two) times daily.   carvedilol 25 MG tablet Commonly known as:  COREG Take 1 tablet (25 mg total) by mouth 2 (two) times daily with a meal.   docusate sodium 100 MG capsule Commonly known as:  COLACE Take 1 capsule (100 mg total) by mouth 2 (two) times daily. What changed:    when to take this  reasons to take this   ferrous gluconate 324 MG tablet Commonly known as:  FERGON Take 1 tablet (324 mg total) by mouth 2 (two) times daily with a meal. What changed:  when to take this   furosemide 40 MG tablet Commonly known as:  LASIX Take 1 tablet (40  mg total) by mouth 2 (two) times daily.   lisinopril 5 MG tablet Commonly known as:  PRINIVIL,ZESTRIL Take 1 tablet (5 mg total) by mouth daily.   OXYGEN Inhale 3.5 L into the lungs daily.   pantoprazole 40 MG tablet Commonly known as:  PROTONIX Take 1 tablet (40 mg total) by mouth daily at 6 (six) AM. What changed:  when to take this   potassium chloride SA 20 MEQ tablet Commonly known as:  K-DUR,KLOR-CON Take 1 tablet (20 mEq total) by mouth daily.   rivaroxaban 20 MG Tabs tablet Commonly known as:  XARELTO Take 1 tablet (20 mg total) by mouth daily with breakfast. What changed:  when to take this   spironolactone 50 MG tablet Commonly known as:  ALDACTONE Take 50 mg daily by mouth. For fluid   tamsulosin 0.4 MG Caps capsule Commonly known as:  FLOMAX Take 1 capsule (0.4 mg total) by mouth daily.      Allergies  Allergen Reactions  . Banana Shortness Of Breath  . Bee Venom Shortness Of Breath, Swelling and Other (See Comments)    Reaction:  Facial swelling  . Food Shortness Of Breath, Rash and Other (See Comments)    Pt states that  he is allergic to strawberries.    . Aspirin Other (See Comments)    Reaction:  GI upset   . Metolazone Other (See Comments)    Pt states that he stopped taking this med due to heart attack like symptoms.   . Oatmeal Nausea And Vomiting  . Orange Juice [Orange Oil] Nausea And Vomiting    All acidic products make him nauseous and upset stomach  . Torsemide Swelling and Other (See Comments)    Reaction:  Swelling of feet/legs   . Diltiazem Palpitations  . Hydralazine Palpitations  . Lipitor [Atorvastatin] Other (See Comments)    Reaction:  Nose bleeds    Follow-up Information    Pearson Grippe, MD. Schedule an appointment as soon as possible for a visit in 2 week(s).   Specialty:  Internal Medicine Contact information: [address] [place] [address] [phone]            The results of significant diagnostics from this hospitalization (including imaging, microbiology, ancillary and laboratory) are listed below for reference.    Significant Diagnostic Studies: Dg Chest 2 View  Result Date: 09/08/2017 CLINICAL DATA:  Shortness of breath and mid chest pain. EXAM: CHEST - 2 VIEW COMPARISON:  Radiograph 09/03/2017 at [facility] FINDINGS: Cardiomegaly is unchanged. Stable pulmonary vasculature with vascular congestion. No confluent airspace disease, pleural effusion or pneumothorax. Soft tissue attenuation from habitus limits assessment. IMPRESSION: Unchanged cardiomegaly and vascular congestion. No acute abnormality. Electronically Signed   By: Rubye Oaks M.D.   On: 09/08/2017 02:41   Dg Chest Portable 1 View  Result Date: 09/08/2017 CLINICAL DATA:  Chest pain and shortness of breath EXAM: PORTABLE CHEST 1 VIEW COMPARISON:  September 08, 2017 FINDINGS: There is no edema or consolidation. There is cardiomegaly with pulmonary venous hypertension. No adenopathy. No evident bone lesions. IMPRESSION: Pulmonary vascular congestion.  No edema or consolidation.  Electronically Signed   By: Bretta Bang III M.D.   On: 09/08/2017 13:29    Microbiology: No results found for this or any previous visit (from the past 240 hour(s)).   Labs: Basic Metabolic Panel: Recent Labs  Lab 09/08/17 0141 09/08/17 1340 09/09/17 0419 09/10/17 0614 09/11/17 0559  NA 141 140 139 141 144  K 3.5 3.1* 3.3* 3.4* 3.9  CL 102 100* 101 105 106  CO2 27 28 28 28 27   GLUCOSE 97 90 93 88 85  BUN 12 13 13 19  22*  CREATININE 1.10 1.10 1.14 1.21 1.22  CALCIUM 8.7* 8.5* 8.1* 8.7* 9.0  MG  --   --  1.6*  --  2.2   Liver Function Tests: Recent Labs  Lab 09/08/17 1340  AST 25  ALT 18  ALKPHOS 78  BILITOT 1.3*  PROT 7.2  ALBUMIN 3.7   No results for input(s): LIPASE, AMYLASE in the last 168 hours. No results for input(s): AMMONIA in the last 168 hours. CBC: Recent Labs  Lab 09/08/17 0141 09/08/17 1340 09/09/17 0419  WBC 5.3 4.7 5.5  NEUTROABS 3.7 3.2  --   HGB 12.9* 13.2 12.2*  HCT 40.4 41.9 38.8*  MCV 87.1 88.4 87.8  PLT 223 205 226   Cardiac Enzymes: Recent Labs  Lab 09/08/17 1340 09/08/17 1559 09/08/17 2119 09/09/17 0419  TROPONINI 0.04* 0.04* 0.04* 0.04*   BNP: BNP (last 3 results) Recent Labs    05/09/17 0329 05/11/17 0308 09/08/17 1340  BNP 557.0* 487.0* 233.0*    ProBNP (last 3 results) No results for input(s): PROBNP in the last 8760 hours.  CBG: Recent Labs  Lab 09/10/17 0744 09/11/17 0736  GLUCAP 78 81       Signed:  Chaya Jan  [facility] Pager: [phone] 09/11/2017, 3:43 PM

## 2017-09-13 ENCOUNTER — Other Ambulatory Visit: Payer: Self-pay | Admitting: Pharmacist

## 2017-09-13 NOTE — Patient Outreach (Signed)
[facility]) Care Management  [facility]   09/13/2017  [name] Nov 19, 1965 [id number]  52 year old male referred to [facility] Care Management for transition of care services and medication reconciliation following recent hospitalization.  PMHx includes, but not limited to, atrial fibrillation on chronic anticoagulation, systolic heart failure (EF 55% 11/'18), HTN, GERD, obesity, and asthma with recent admission for acute decompensated heart failure exacerbation.   Unsuccessful telephone call attempt #1 to [name] today.  No voicemail available to leave message.    Plan: I will follow-up with [name] tomorrow regarding medication reconciliation.   [name], PharmD, [facility] Clinical Pharmacist [facility] [employer] [phone]

## 2017-09-14 ENCOUNTER — Ambulatory Visit: Payer: Self-pay | Admitting: Pharmacist

## 2017-09-14 ENCOUNTER — Other Ambulatory Visit: Payer: Self-pay | Admitting: *Deleted

## 2017-09-14 DIAGNOSIS — I5023 Acute on chronic systolic (congestive) heart failure: Secondary | ICD-10-CM | POA: Diagnosis not present

## 2017-09-14 DIAGNOSIS — I10 Essential (primary) hypertension: Secondary | ICD-10-CM | POA: Diagnosis not present

## 2017-09-14 DIAGNOSIS — I482 Chronic atrial fibrillation: Secondary | ICD-10-CM | POA: Diagnosis not present

## 2017-09-14 DIAGNOSIS — E785 Hyperlipidemia, unspecified: Secondary | ICD-10-CM | POA: Diagnosis not present

## 2017-09-14 DIAGNOSIS — D649 Anemia, unspecified: Secondary | ICD-10-CM | POA: Diagnosis not present

## 2017-09-14 NOTE — Patient Outreach (Signed)
[facility]) Care Management  09/14/2017  [name] 1966/05/17 [id number]   Transition of care week one, call one  Cm consulted by [facility] hospital liaison J Minor to discuss patient  No answer at number listed in EPIC after 10 rings.   [facility] CM unable to leave a left a HIPPA compliant voice message for patient.  Plans Cm to attempt another call to patient this week Consulted [facility] liaison for possible another contact person or number to reach him without success  Dereka Lueras L. Noelle Penner, RN, BSN, CCM [facility] Care Management Care Coordinator [phone] week day mobile

## 2017-09-15 ENCOUNTER — Other Ambulatory Visit: Payer: Self-pay | Admitting: Pharmacist

## 2017-09-15 ENCOUNTER — Other Ambulatory Visit: Payer: Self-pay | Admitting: *Deleted

## 2017-09-15 ENCOUNTER — Ambulatory Visit: Payer: Self-pay | Admitting: Pharmacist

## 2017-09-15 NOTE — Patient Outreach (Addendum)
[facility]) Care Management  [facility]   09/15/2017  [name] [birth year] [id number]  Successful call to [name] today. HIPAA identifiers verified. Patient reports that he is doing fine since discharge but later reports that he has started to gain weight and have swelling again.  He reports being very frustrated that his medications "all make me sicker" and that diuretics cause him to have increased swelling.  He reports that "generics don't work."  He reports not taking medications consistently.  He reports his weight is 273lb but after weighing himself during telephone conversation, he reports weight of 375lb.    Patient initially agreeable to [facility] services verbally but later declines after describing services that would include patient accountability and efforts toward improving his own healthcare.    Unable to perform accurate medication list with patient.  Patient did not have good understanding of indications of medications or how often to take them.  He did not want to review recommended directions from provider or set up home visit to have face-to-face education on medications.    Care communication call placed to [facility] RN to discuss patient.   Home visit with RN scheduled for Friday, March 29th.  RN will communicate with me if patient would like to have pharmacy services after their meeting.   Plan: Message left to discuss patient and increased weight since discharge with Dr. Fayrene Fearing Kim's office.  I will await call back from provider and from [facility] RN.     [name], PharmD, [facility] Clinical Pharmacist [facility] [phone]     I

## 2017-09-15 NOTE — Patient Outreach (Signed)
[facility]) Care Management  09/15/2017  OBALOLUWA GEARS 14-Sep-1965 [id number]   transition of care call day 2   Call attempt #1 no answer unable to leave a message Call from [facility] pharmacist to state patient reached and not complying with services Left message for family member to include cm return mobile number  Left a message with case worker Elana  to include cm return mobille number    Plans Cm to send unable to reach outreach letter Pending return call from patient, family or case worker and pend for possible discharge  Kimberly L. Noelle Penner, RN, BSN, CCM [facility] Care Management Care Coordinator [phone] week day mobile

## 2017-09-15 NOTE — Patient Outreach (Signed)
[facility]) Care Management  09/15/2017  [name] [birth year] [id number]   Transition of care  Call returned form Elana listed as case worker in Epic who reports she see him for [facility] integrated medicine services She has been seeing him for nearly 2 years Reports noncompliance and concerns with visits to various hospitals including [facility] Confirms the number in epic is the only number listed for him She will attempt to call the patient to attempt to make contact with [facility] CM  Reports pt lives in the basement of the home with his mother, son and daughter (jasmine) Wt 397 lbs and gain wt approximately 100 lbs in 2 years  Plan sent unable to outreach letter to patient  Cala Bradford L. Noelle Penner, RN, BSN, CCM [facility] Care Management Care Coordinator [phone] week day mobile

## 2017-09-15 NOTE — Patient Outreach (Signed)
[facility]) Care Management  09/15/2017  [name] July 20, 1965 [id number]  Transition of care   CM received a call from Elana after she called patient  He ws called by CM and answered immediately CM reviewed [facility] CM community and pharmacy program services.  Cm discussed call from [facility] pharmacy earlier Mr Boutte began to discuss not getting the same quality of medications in the ED as at home "I have generics here" CM and Mr Piekarski discussed this.  CM discussed his home treatment can as effective as his tx at ED or hospital.   CM discussed home visit and encouraged him to provide a day and time he would be available to see [facility] staff. Pt agreed to home visit this week \  Plans to see mr [name] for home visit this week as agreed upon during call  CM updated [facility] pharmacist , Ulyess Blossom L. Noelle Penner, RN, BSN, CCM [facility] Care Management Care Coordinator [phone] week day mobile

## 2017-09-17 ENCOUNTER — Other Ambulatory Visit: Payer: Self-pay | Admitting: *Deleted

## 2017-09-17 NOTE — Patient Outreach (Signed)
[facility]) Care Management   09/17/2017  [name] 10/01/1965 [id number]  [name] is an 52 y.o. male followed by [facility] CM for transition of care services after noted admissions x 5 and ED visits x 2 listed per Epic.   Subjective:  "fluids building up even though I am taking my medications" "medications never does right. I puts it back in me." (referring to fluids)  "I need to keep the fluid out of me." " I am afraid of drinking too much"  Objective:   BP (!) 160/80   Pulse 89   Temp 97.8 F (36.6 C) (Oral)   Resp 20   Ht 1.829 m (6')   Wt (!) 370 lb (167.8 kg)   SpO2 97%   BMI 50.18 kg/m  Review of Systems  Constitutional: Negative for chills, diaphoresis, fever, malaise/fatigue and weight loss.  HENT: Negative for congestion, ear discharge, ear pain, hearing loss, nosebleeds, sinus pain, sore throat and tinnitus.   Eyes: Positive for photophobia. Negative for blurred vision, double vision, pain, discharge and redness.       Reports sensitivity to light has on dark glasses  Respiratory: Positive for wheezing. Negative for cough, hemoptysis, sputum production, shortness of breath and stridor.        Noted on left anterior upper lobe  Cardiovascular: Positive for leg swelling. Negative for chest pain, palpitations, orthopnea, claudication and PND.       Bilateral edema of legs with tough darkened skin bilaterally  Gastrointestinal: Negative.  Negative for abdominal pain, blood in stool, constipation, diarrhea, heartburn, melena, nausea and vomiting.  Genitourinary: Negative for dysuria, flank pain, frequency, hematuria and urgency.       C/o not urinating "much" with Dark yellow to orange urine"  Musculoskeletal: Positive for joint pain. Negative for back pain, falls, myalgias and neck pain.  Skin: Negative for itching and rash.    Physical Exam  Constitutional: He is oriented to person, place, and time. He appears well-developed and  well-nourished.  HENT:  Head: Normocephalic and atraumatic.  Neck: Normal range of motion. Neck supple.  Cardiovascular: Normal rate.  Respiratory: He has wheezes.  GI: He exhibits distension.  Musculoskeletal: He exhibits edema.  Neurological: He is alert and oriented to person, place, and time.  Skin: Skin is warm and dry.  Psychiatric: His behavior is normal.    Encounter Medications:   Outpatient Encounter Medications as of 09/17/2017  Medication Sig Note  . albuterol (PROVENTIL) (2.5 MG/3ML) 0.083% nebulizer solution Take 2.5 mg by nebulization every 6 (six) hours as needed for wheezing or shortness of breath.   Marland Kitchen aspirin 81 MG chewable tablet Chew 1 tablet (81 mg total) by mouth daily. 09/17/2017: Confirms not compliant because irritate stomach   . atorvastatin (LIPITOR) 40 MG tablet Take 1 tablet (40 mg total) by mouth daily at 6 PM.   . beclomethasone (QVAR) 80 MCG/ACT inhaler Inhale 2 puffs into the lungs 2 (two) times daily.    . carvedilol (COREG) 25 MG tablet Take 1 tablet (25 mg total) by mouth 2 (two) times daily with a meal.   . docusate sodium (COLACE) 100 MG capsule Take 1 capsule (100 mg total) by mouth 2 (two) times daily. (Patient taking differently: Take 100 mg by mouth 2 (two) times daily as needed for mild constipation. )   . ferrous gluconate (FERGON) 324 MG tablet Take 1 tablet (324 mg total) by mouth 2 (two) times daily with a meal. (Patient  taking differently: Take 324 mg by mouth daily with breakfast. )   . furosemide (LASIX) 40 MG tablet Take 1 tablet (40 mg total) by mouth 2 (two) times daily.   Marland Kitchen lisinopril (PRINIVIL,ZESTRIL) 5 MG tablet Take 1 tablet (5 mg total) by mouth daily.   . OXYGEN Inhale 3.5 L into the lungs daily. 09/17/2017: 3-3.5 L prn   . pantoprazole (PROTONIX) 40 MG tablet Take 1 tablet (40 mg total) by mouth daily at 6 (six) AM. (Patient taking differently: Take 40 mg by mouth every evening. )   . potassium chloride (K-DUR,KLOR-CON) 20 MEQ  tablet Take 1 tablet (20 mEq total) by mouth daily.   . rivaroxaban (XARELTO) 20 MG TABS tablet Take 1 tablet (20 mg total) by mouth daily with breakfast. (Patient taking differently: Take 20 mg by mouth daily with supper. ) 09/17/2017: Taking in evening     . spironolactone (ALDACTONE) 50 MG tablet Take 50 mg daily by mouth. For fluid   . tamsulosin (FLOMAX) 0.4 MG CAPS capsule Take 1 capsule (0.4 mg total) by mouth daily.   . [DISCONTINUED] potassium chloride 20 MEQ TBCR Take 20 mEq by mouth 2 (two) times daily.    No facility-administered encounter medications on file as of 09/17/2017.     Functional Status:   In your present state of health, do you have any difficulty performing the following activities: 09/08/2017 05/11/2017  Hearing? N N  Vision? N N  Difficulty concentrating or making decisions? N N  Walking or climbing stairs? N N  Dressing or bathing? N N  Doing errands, shopping? N N  Some recent data might be hidden    Fall/Depression Screening:    No flowsheet data found. No flowsheet data found.  Assessment:    Initially as [facility] CM entered [name]'s home, he greeted her and took her to his scale to weigh- Weight was 370 lbs with his shoes and clothes on CM provided information on [facility] program, consent completed and answered questions about [facility] services and staff. [name] confirms he followed up with his pcp office on 09/14/17 and was seen by [name]   He and his mother were allowed to ventilate their feelings about believing [name] was not receiving the right type or dose of medications at home to help remove fluids as "in the hospital" CM discussed consistency with taking medication and being on the right medications, right doses at the right time.  They voiced understanding   CHF - [name] confirms he is ordered take in [name] informed CM initially that he only sees staff at Samuel Elmyra Fitzpatrick office and did not have a cardiologist but later in the visit he informed CM  he was previously seen at "[facility] cardiology in [place]" Samuel [place] informed CM he had not been to Cardiac rehab   Plan:  Follow up with Samuel Voth for continued transition of care services  [facility] CM discussed the best way to weigh daily (early in am after voiding with similar clothes on daily) [facility] CM discussed preventative care, cardiac rehab (process to get referral, program activities, etc)  and nutrition [facility] CM demonstrated with pt sitting in his mother's recliner Fitzpatrick to effective keep his legs elevated to decrease edema [facility] answered questions for him and his mother plus discussed the processes for obtaining and differences in home health, hospice and palliative care services [facility] CM called and left a message for Samuel Elmyra Ricks RN and spoke with staff at Samuel Purvis Sheffield  office to obtain a CV f/u appointment on October 07 2017 at 0820 - Cm also asked about a referral to cardiac rehab and was informed Samuel Apodaca must first see the CV [facility] CM received a return call from Samuel Elmyra Ricks RN who explained Samuel Mullarkey's non compliance with taking his medications, RN reports he presently has not picked up his medicines ordered from his pharmacy as of 1430 on 09/17/17    Kimberly L. Noelle Penner, RN, BSN, CCM [facility] Care Management Care Coordinator [phone] week day mobile

## 2017-09-22 ENCOUNTER — Other Ambulatory Visit: Payer: Self-pay | Admitting: *Deleted

## 2017-09-23 IMAGING — DX DG CHEST 2V
2 series · 2 of 2 positions shown · non-contrast
Comparison: January 05, 2016

CLINICAL DATA: Shortness of breath with lower extremity edema

EXAM:
CHEST  2 VIEW

[chest lat]
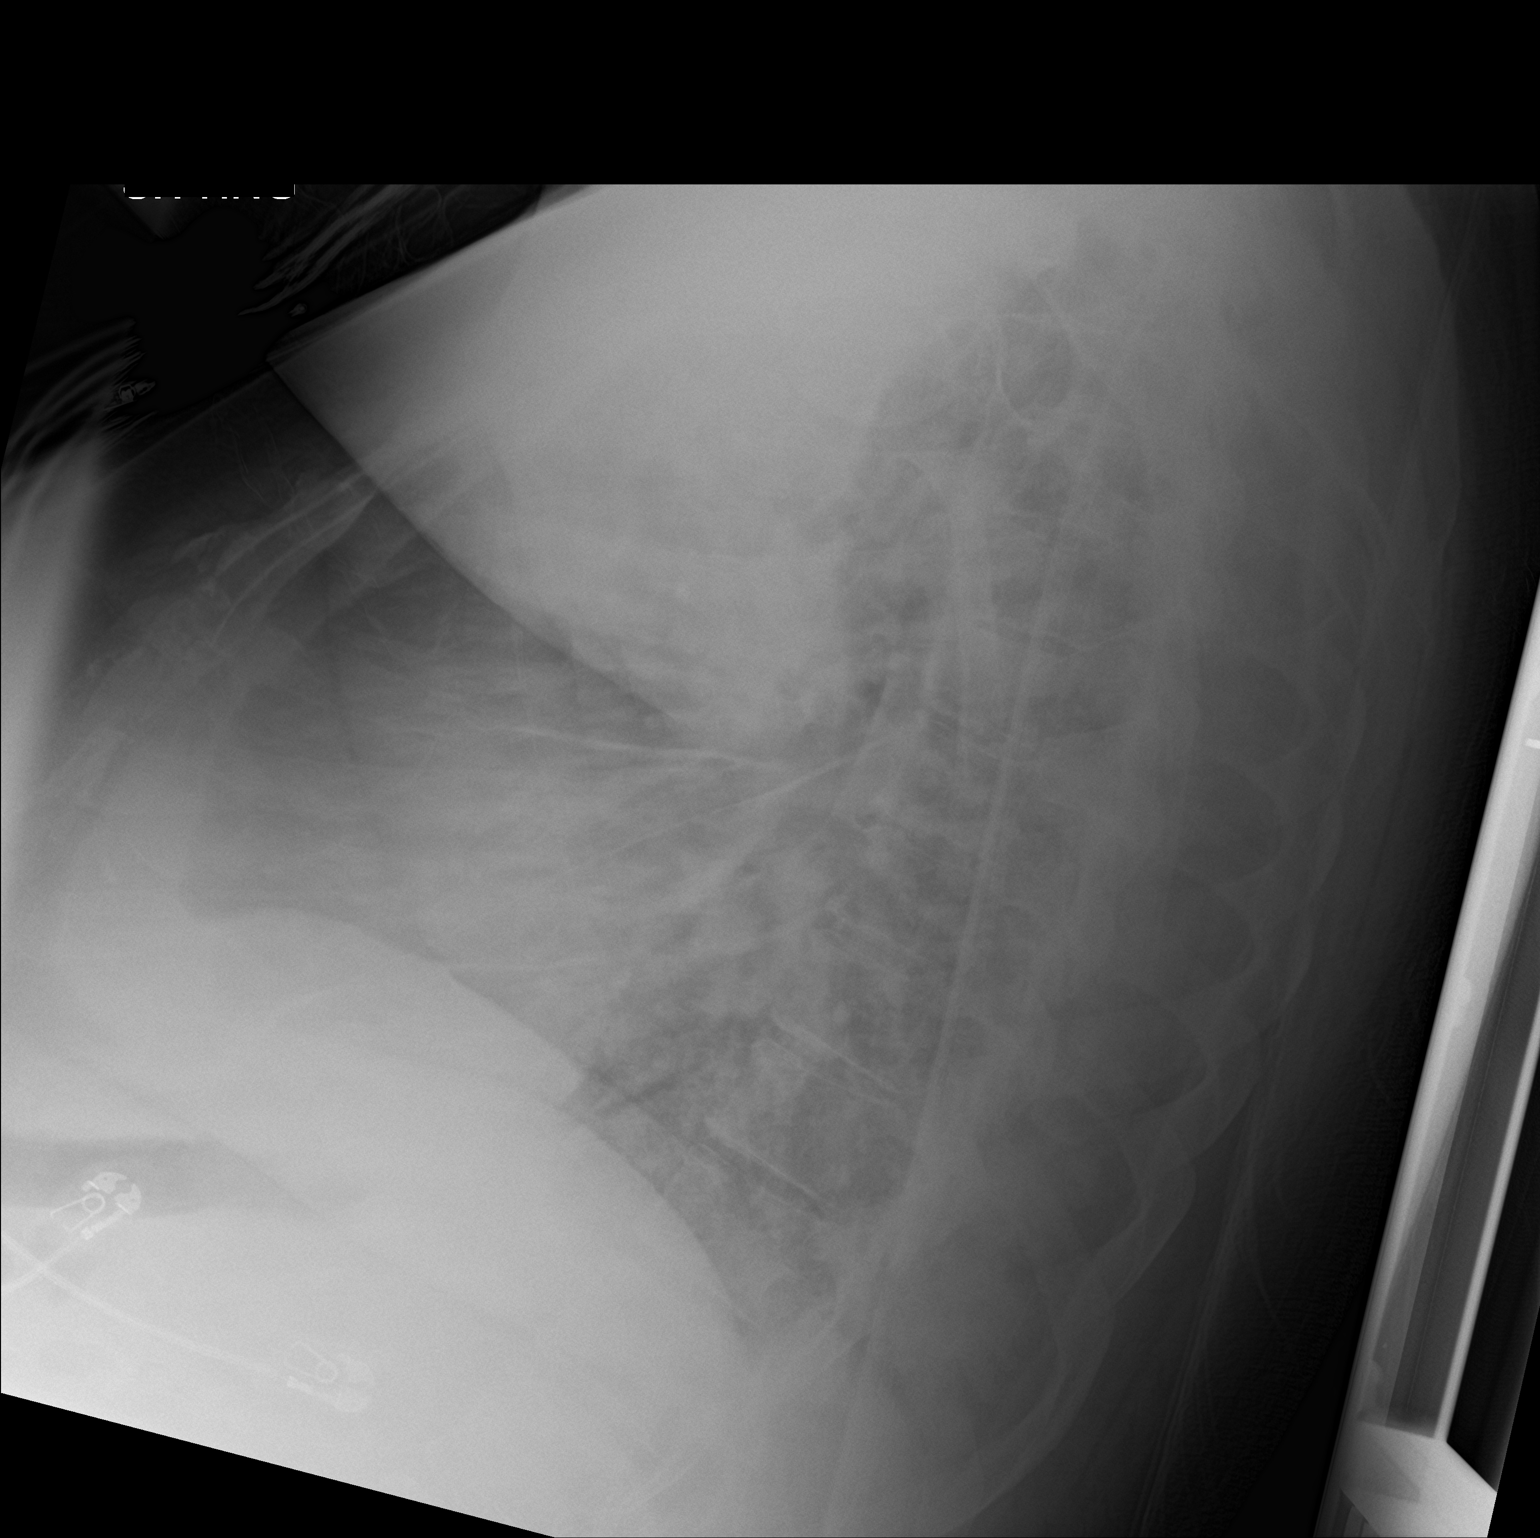

[chest ap strecther]
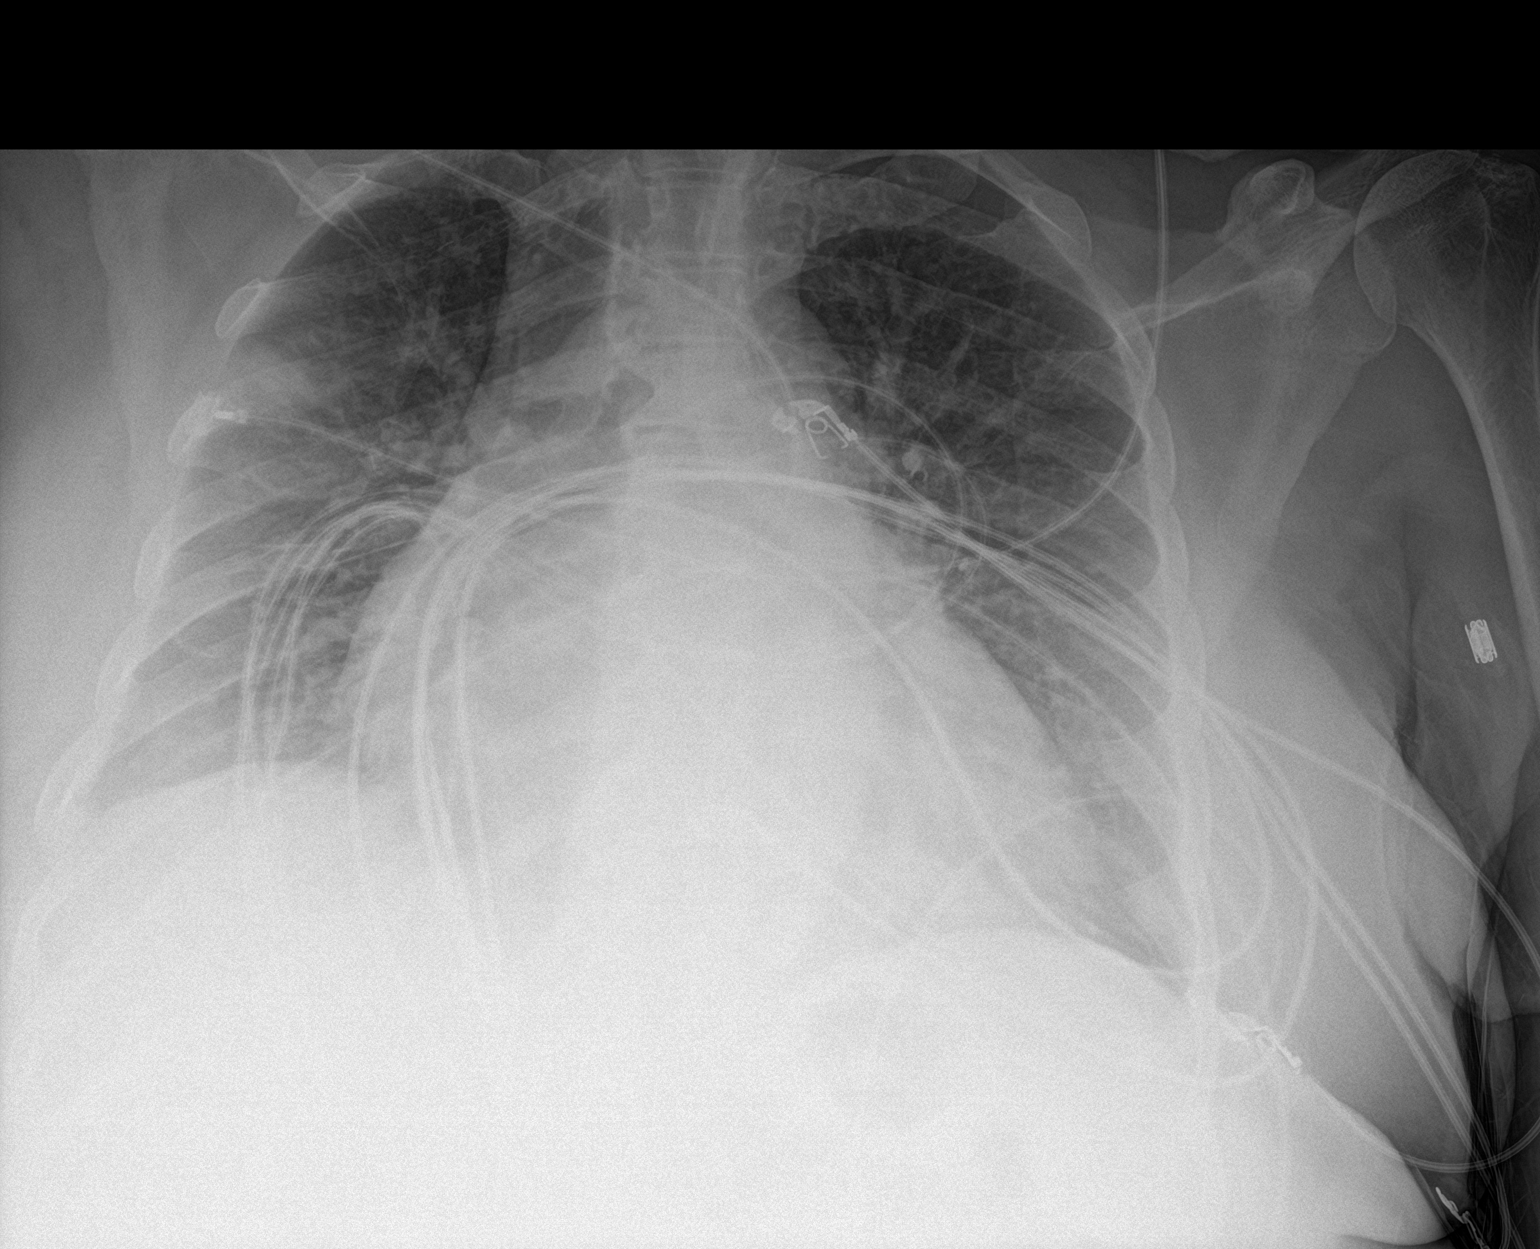

[2 of 2 positions shown; findings below may reference images not displayed]

FINDINGS: There is a small focus of opacity in the right upper lobe near a
monitor lead, concerning for a small focus of pneumonia. There is
persistent cardiomegaly with pulmonary venous hypertension. There is
no appreciable pulmonary edema or pleural effusion. No adenopathy.
There is degenerative change in the thoracic spine.
IMPRESSION: Small focus of increased opacity in the periphery of the right upper
lobe, concerning for a small focus of pneumonia. A monitor lead in
this area potentially may be contributing to the increased opacity
in this area. On subsequent evaluation, monitor lead ideally should
be placed elsewhere to more precisely assess this area.

There is underlying pulmonary vascular congestion without edema or
pleural effusions evident.

## 2017-09-23 IMAGING — CT CT HEAD W/O CM
3 of 4 series · 15 of 47 positions shown, 18 images · non-contrast
Comparison: None.

CLINICAL DATA: Shortness of breath, dizziness and altered mental
status for 3 days. Initial encounter.

EXAM:
CT HEAD WITHOUT CONTRAST
TECHNIQUE: Contiguous axial images were obtained from the base of the skull
through the vertex without intravenous contrast.

[Series 2: head w/o · axial · non-contrast · 0.44mm/px · z∈[+155,+287]mm · 9 of 39 slices shown, 12 images]
[im 3/39  brain]
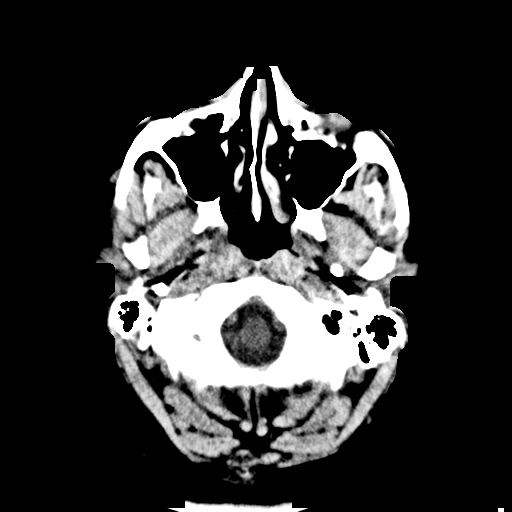
[im 3/39  bone]
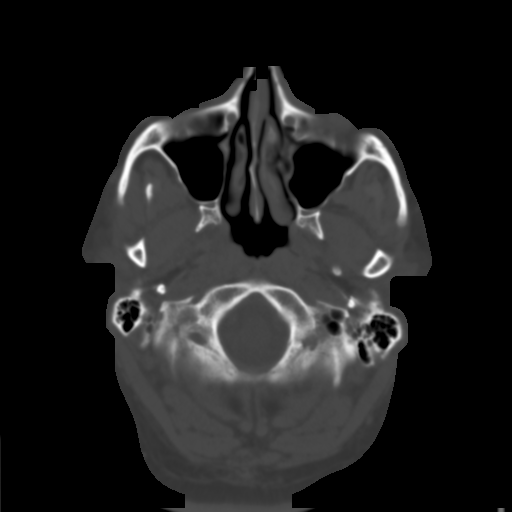
[im 9/39  brain]
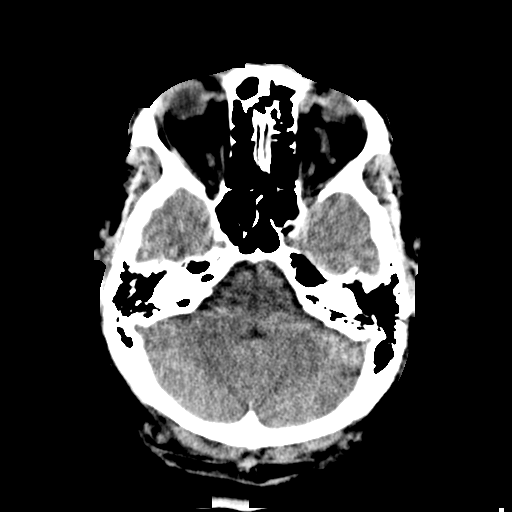
[im 11/39  brain]
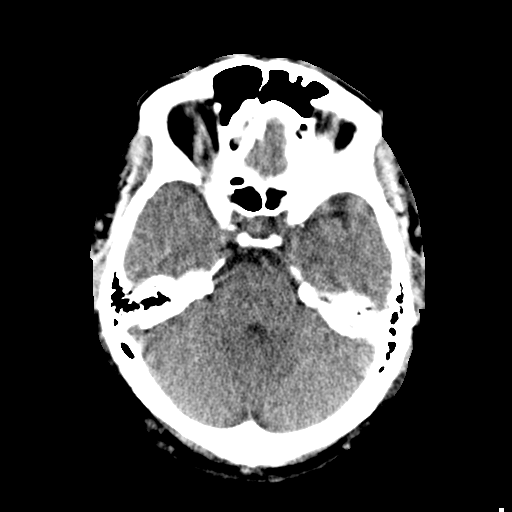
[im 17/39  brain]
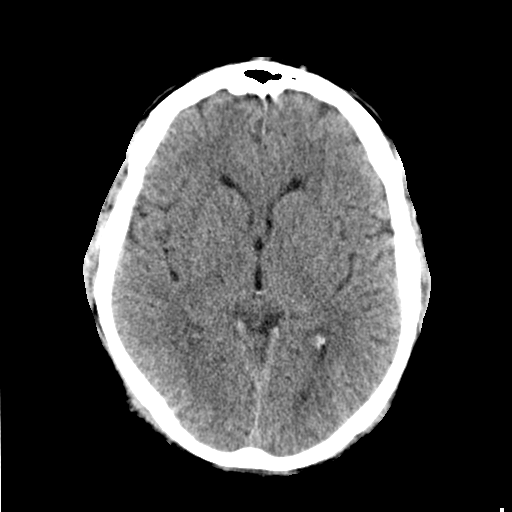
[im 20/39  brain]
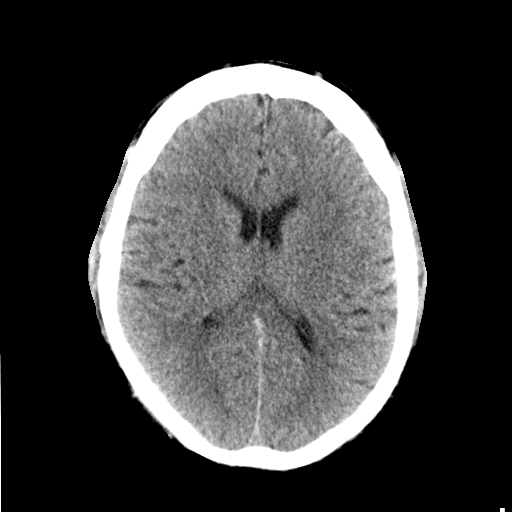
[im 20/39  bone]
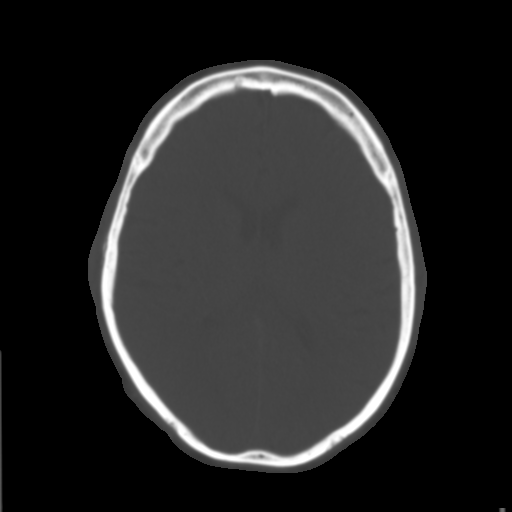
[im 22/39  brain]
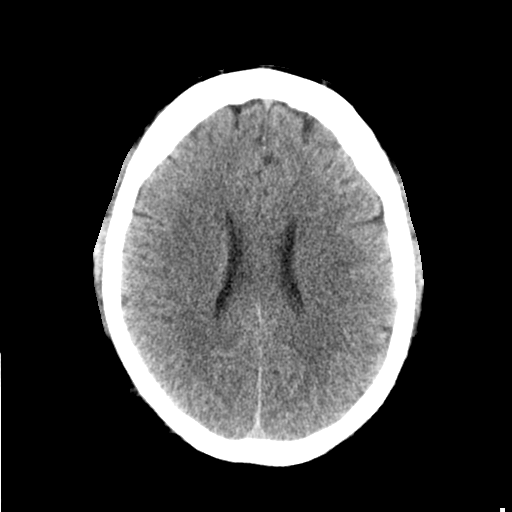
[im 28/39  brain]
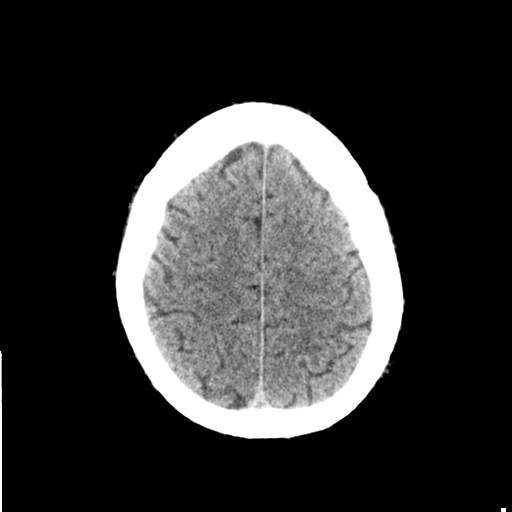
[im 30/39  brain]
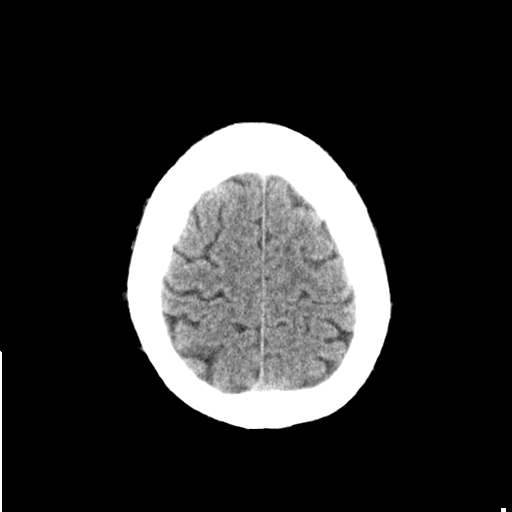
[im 36/39  brain]
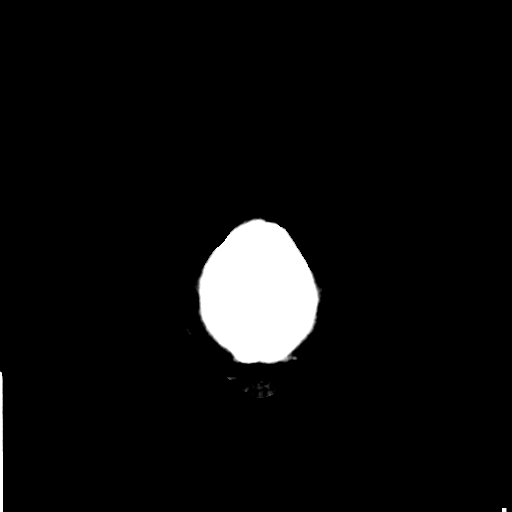
[im 36/39  bone]
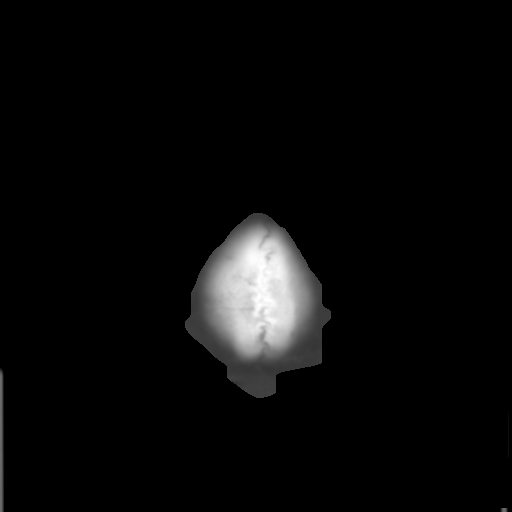

[Series 4: coronal · coronal · 0.29mm/px · 3 of 70 slices shown]
[im 24/70  brain]
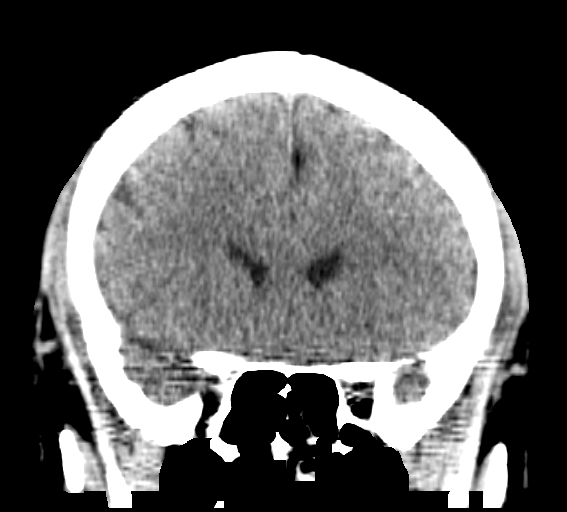
[im 31/70  brain]
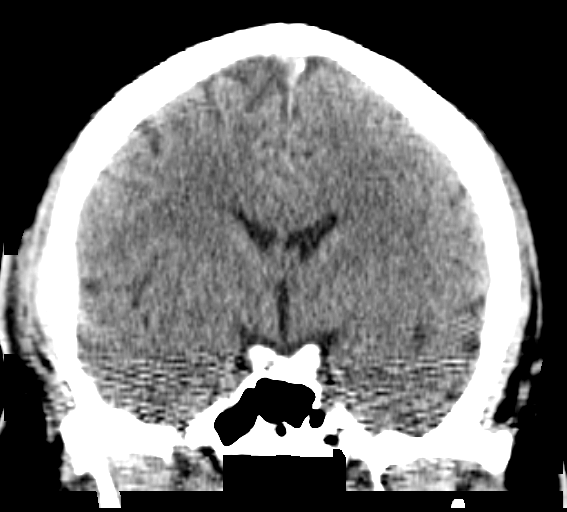
[im 39/70  brain]
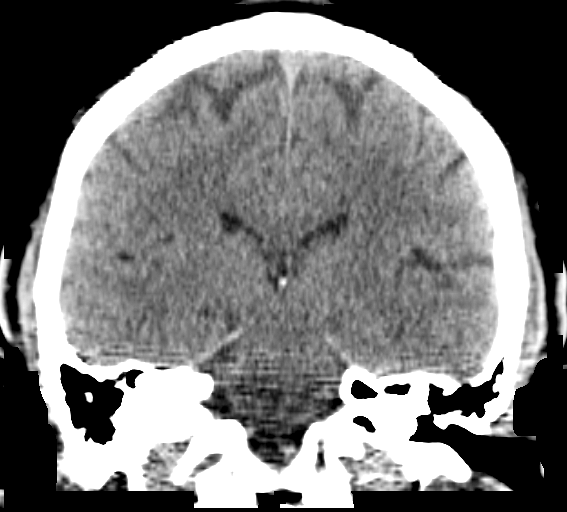

[Series 5: sagittal · sagittal · 0.31mm/px · 3 of 57 slices shown]
[im 19/57  brain]
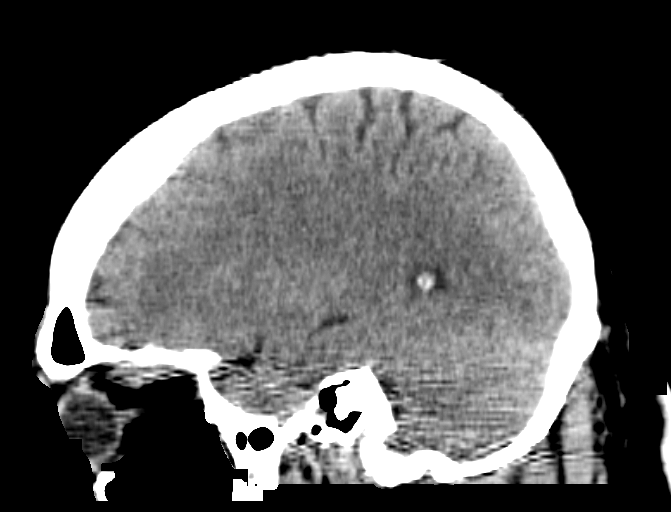
[im 29/57  brain]
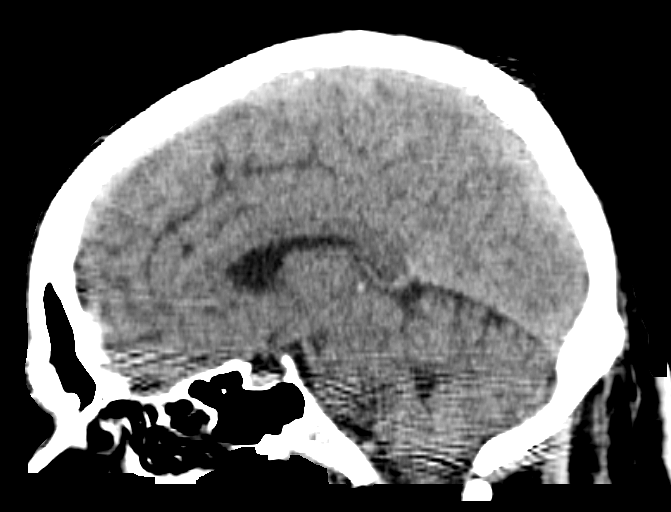
[im 38/57  brain]
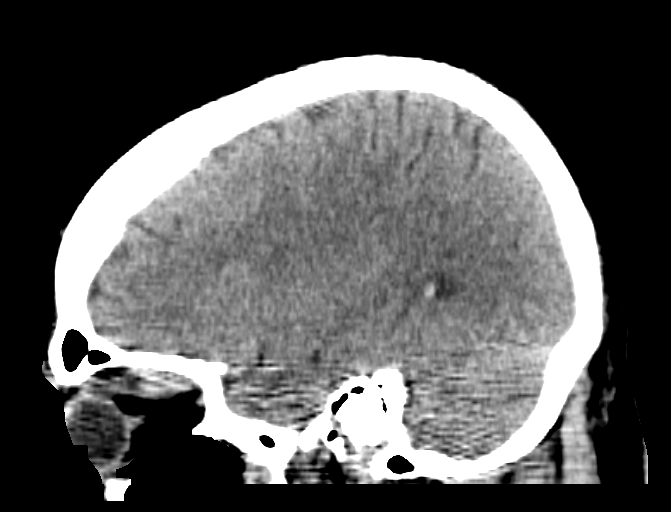

[15 of 47 positions shown; findings below may reference images not displayed]

FINDINGS: The brain appears normal without hemorrhage, infarct, mass lesion,
mass effect, midline shift or abnormal extra-axial fluid collection.
No hydrocephalus or pneumocephalus. The calvarium is intact. Mucous
retention cyst or polyp left maxillary sinus is identified.
IMPRESSION: No acute intracranial abnormality.

Mucous retention cyst or polyp left maxillary sinus.

## 2017-09-26 IMAGING — DX DG CHEST 2V
3 series · 3 of 3 positions shown · non-contrast
Comparison: 01/17/2016 and earlier.

CLINICAL DATA: 49-year-old male with shortness of breath, chest
pain, cough, nausea and fever for 2 days. Initial encounter.

EXAM:
CHEST  2 VIEW

[chest pa]
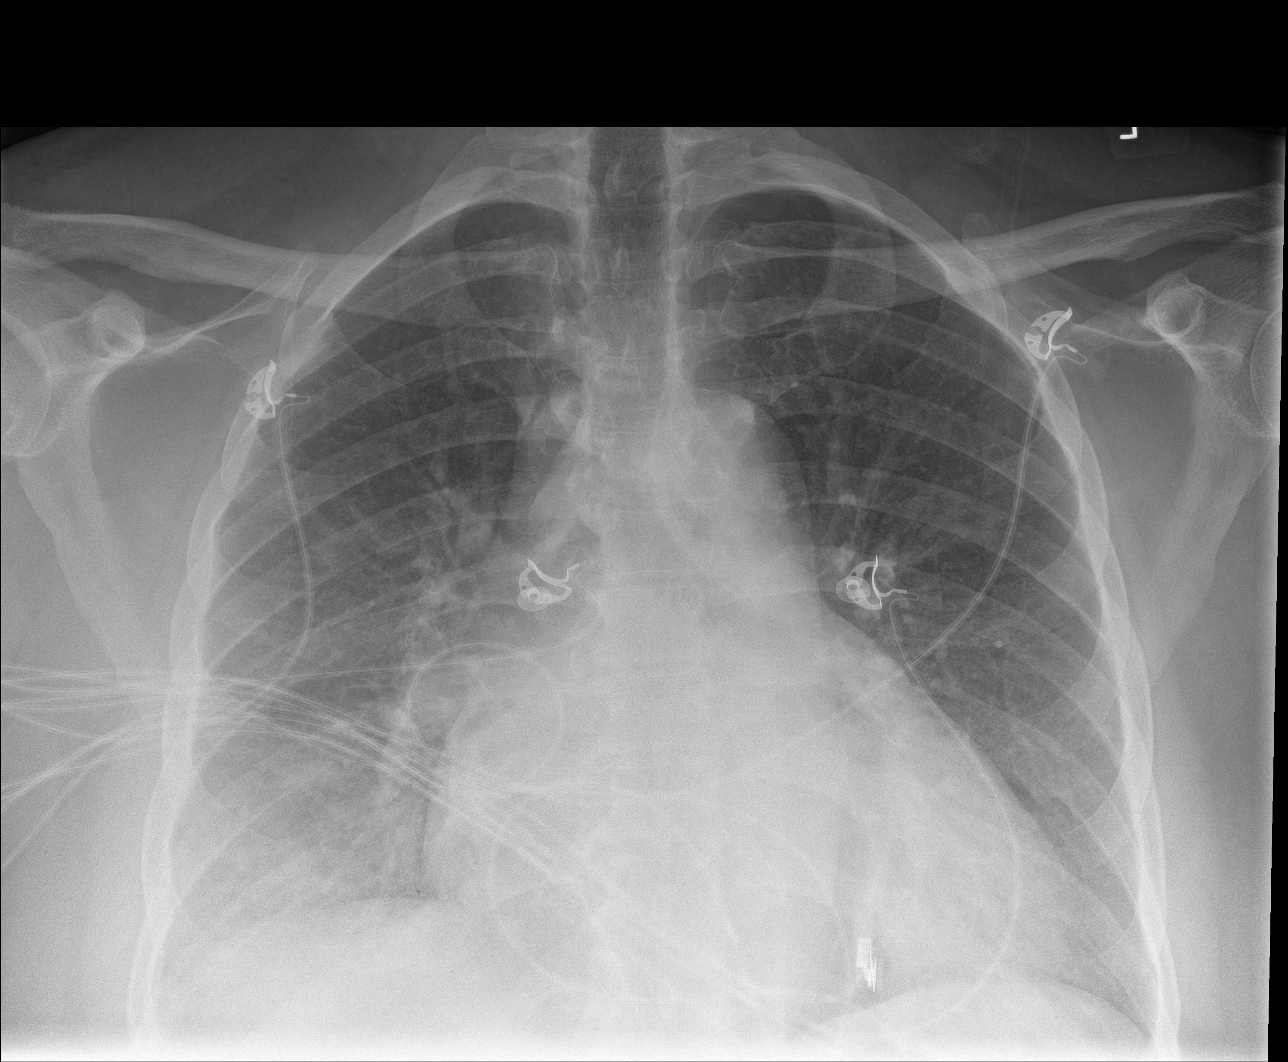

[chest lat (1 of 2)]
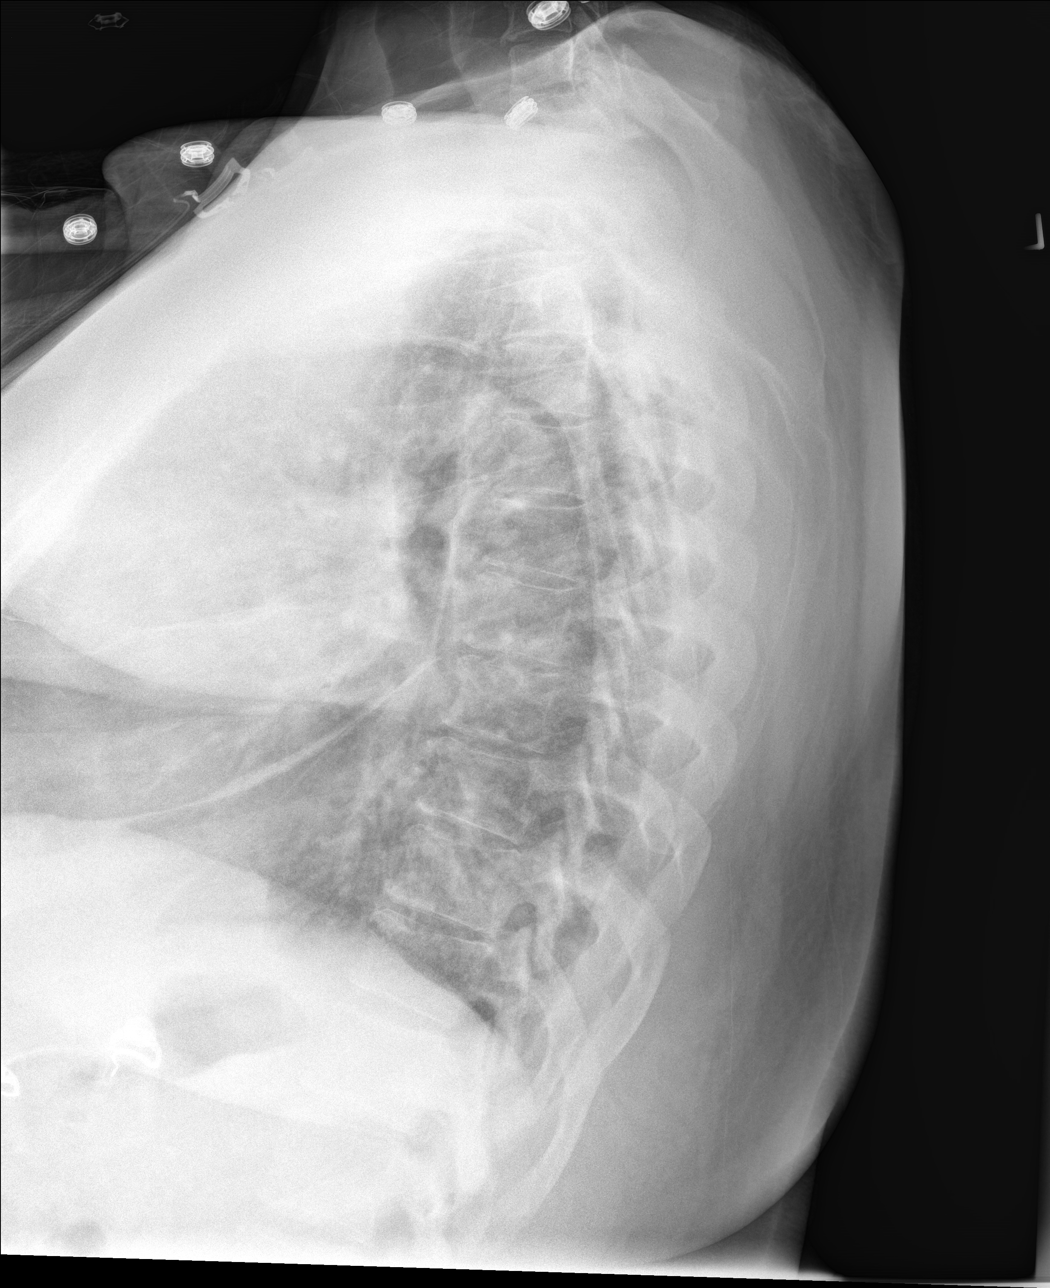

[chest lat (2 of 2)]
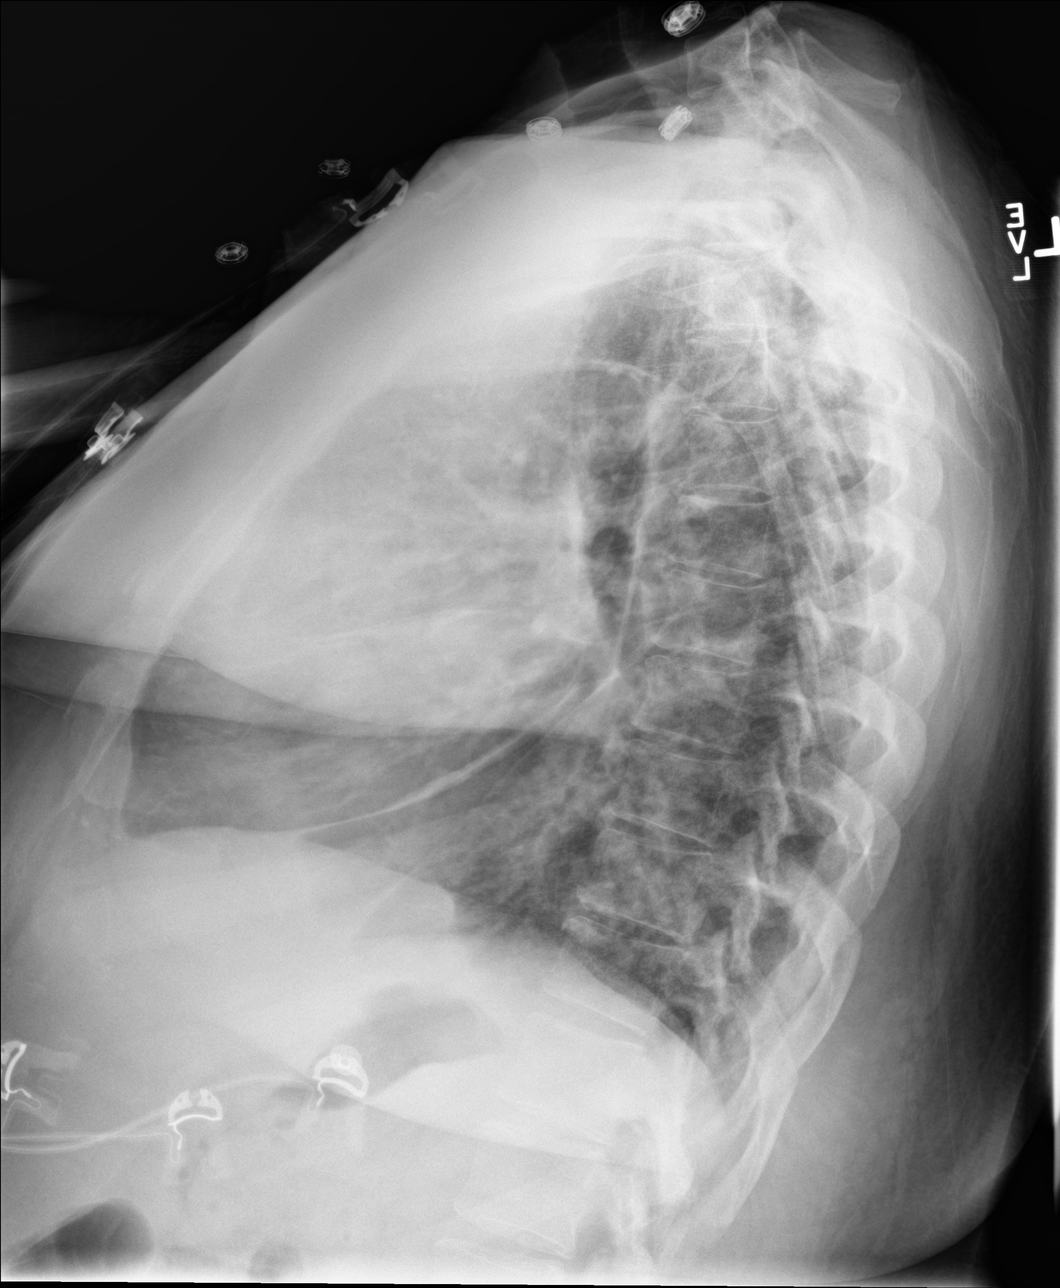

[3 of 3 positions shown; findings below may reference images not displayed]

FINDINGS: Continued indistinct appearance of pulmonary vasculature throughout.
Stable cardiomegaly and mediastinal contours. Small volume fluid
tracking in the major fissures. No other layering pleural effusion.
No pneumothorax. No confluent opacity other than crowding of
markings at both lung bases today - and the previously questioned
right upper lobe opacity has resolved. Visualized tracheal air
column is within normal limits. No acute osseous abnormality
identified.
IMPRESSION: Appearance most compatible with pulmonary edema with small volume
pleural fluid.

## 2017-09-27 ENCOUNTER — Other Ambulatory Visit: Payer: Self-pay | Admitting: *Deleted

## 2017-09-27 NOTE — Patient Outreach (Signed)
[facility]) Care Management  09/22/2017  [name] 11-Sep-1965 [id number]   Transition of care week 2 call  Cm spoke with [name] today to review concerns with CHF. CM updated him again on the return call information from his MD office related to his medications and compliance with treatment plan. Cm inquired about medications that at reported at his pharmacy that hs not been picked up.  [name] informs CM he did not pick up medicines at his pharmacy because he "already has that medication here at my house."  He reports still having issues with orthopnea, swelling of legs, decreased mobility with some sob but denies chest pain. He reports taking Bumex versus Lasix or torsemide.  He reports not getting the any fluids off using these medications at this time His wt is reported at 377 lbs CM discussed the importance of reporting abnormal CHF zone s/s to his MD. [name] initially was resistant to calling his provider but informed CM he would call his MD on 09/23/17 to report s/s and increased wt.   CM inquired about compliance with elevation of his legs and is informed that elevation does not work for him  Plans CM will follow up with [name] next week for continued transition of care services.  Kimberly L. Noelle Penner, RN, BSN, CCM [facility] Care Management Care Coordinator [phone] week day mobile

## 2017-09-27 NOTE — Patient Outreach (Signed)
[facility]) Care Management  09/27/2017  [name] [birth year] [id number]   Care coordination    Collaboration with [facility] pharmacy staff about medication adherence/management/compliance   Plans Cm to follow up with Mr Mcmurry this week for transition of care    Nancy Manuele L. Noelle Penner, RN, BSN, CCM [facility] Care Management Care Coordinator [phone] week day mobile

## 2017-10-01 ENCOUNTER — Other Ambulatory Visit: Payer: Self-pay | Admitting: *Deleted

## 2017-10-01 ENCOUNTER — Other Ambulatory Visit: Payer: Self-pay | Admitting: Pharmacist

## 2017-10-01 ENCOUNTER — Ambulatory Visit: Payer: Self-pay | Admitting: Pharmacist

## 2017-10-01 NOTE — Patient Outreach (Signed)
[facility]) Care Management  10/01/2017  [name] Feb 22, 1966 [id number]  Incoming call received [name] and [facility] RN [name] to discuss medication therapy. HIPAA identifiers verified.    Patient is reluctant to discuss medications as he is frustrated with having to "repeat himself." Patient reports that he is currently taking spironolactone and bumetadine to control his weight.  He reports that he cannot take furosemide as this does not work for him because his fluid "always comes back."   Patient reports that the directions for bumetadine 2mg  are 2 tablets qAM and 1 tablet qPM however he is taking 1 pill TID as he feels "too weak" when he takes 2 tablets of bumetadine at the same time and feels it wears off too early.  He reports also taking spironolactone BID rather than once daily as listed in CHL.   Patient reports his weight today = 376lb  Heart Failure Medication Management:  Per patient report, weight is increased from discharge but has remained stable ~370-375lb over the last week.  Patient states he is adherent with spironolactone and bumatadine although taking differently than prescribed.  He does not wish to resume torsemide or furosemide due to ADEs.   Patient has visit with cardiologist next week 10/07/2017 at 8:20AM.  [facility] RN will accompany patient to visit.     Plan: I will reach out to cardiology pharmacist to discuss medication regimen.   [name], PharmD, [facility] Clinical Pharmacist [facility] [employer] [phone]

## 2017-10-01 NOTE — Patient Outreach (Signed)
[facility]) Care Management  10/01/2017  [name] [birth year] [id number]   Care coordination telephone assessment in collaboration with [facility] pharmacist Transition of care week 3   [facility] CM called Mr [name] today Verified patient's identity Cm discussed conferencing with [facility] pharmacist during this call and initially Mr [name] was resistant but finally agreed to a conference call with him, CM and pharmacist, [name]. Mr [name] stated"yes" when CM clarified his statement to conclude he was attempting to state that it feels hopeless and he gets frustrated when he repeats his medication concerns "over and over again", has to state how he feels about differences in home and hospital medicine treatment plans and he has to explain his preference of taking medicines.  CHF - Mr [name] reports his weight in the last week has been from 372-375 lbs but on today it is 376 lbs "I am certain it will go down later in the day when I take more medicine" Other yellow zone symptoms today includes sob "all the time" chest pain and orthopnea  He has not called to report these yellow zone s/s to his primary nor CV MD  He confirms he elevates his legs but "the swelling returns when I get back up and I can't be off my feet all day" - CM encouraged and he agreed to continue to try to elevate his legs as much as he can He does agree elevation does help decrease the edema in his legs.    Medications reviewed   Still does not feel like he is getting enough or the correct CHF medication tx at home Furosemide Feels generic has a lot of side effects and "does not work" States tried lasix (brand name) "got sicker" and a Dr In [place]  "told me to not take it" [name] discussed that Epic indicated he  received generic, furosemide iv during the last hospitalization Has tried torsemide already "and it does not workEngineer, manufacturing shared information CM helped pt reason through his thought process with medicine  concern "Seems like the IV works better" "Takes two kinds in hospital" He reports spirolactone by itself and lasix does not work and he went back to taking Bumex and it is working better [employer] he has an allergy to metoprolol Lasix "make my legs swell up" Body agreeable to spirolactone bid and Bumex 2 mg tid (2 in am & 1 pm) Taking spirolactone and Bumex 1 mg then Bumex 1 mid day then one at night pt adjust meds to last about 7 hours He states reports that he has tried spirolactone and Bumex   Asa and O2 is being taken prn per Mr [name]  Upcoming appointment   October 07 2017 0820 with Dr [name]  Close to the end of the call Mr [name] informed [facility] staff his time on the phone was ending because his mother has to use the phone. " I have a little time left"  All questions were asked, Cm reminded Mr [name] of the upcoming Office visit next week that CM will be attempting to make with him.   Plans follow up with Mr [name] on next week Consulted with [facility] pharmacist   [place] L. Noelle Penner, RN, BSN, CCM [facility] Care Management Care Coordinator [phone] week day mobile

## 2017-10-04 ENCOUNTER — Telehealth: Payer: Self-pay | Admitting: *Deleted

## 2017-10-04 ENCOUNTER — Telehealth (HOSPITAL_COMMUNITY): Payer: Self-pay | Admitting: Surgery

## 2017-10-04 ENCOUNTER — Encounter: Payer: Self-pay | Admitting: *Deleted

## 2017-10-04 NOTE — Telephone Encounter (Signed)
I called [name] to speak to him about possibly scheduling an appointment with [facility].  He tells me that he lives in [place] [facility] and currently does not have reliable transportation to and from appts- particularly in [place].  He has a scheduled appt this week with Dr. Darl Householder.  If the physician feels it necessary to refer patient to [facility] we can arrange for transportation to and from his appts here.  I will plan to reach back out to [name] to see how he is and inquire about his cardiology appt.

## 2017-10-04 NOTE — Telephone Encounter (Addendum)
Doree Fudge - [employer] case mgr - calling to report patient has been in the hospital again.  Wanted to make [place] aware that he states that he is not going to take the Furosemide.  Keeps having to go to hospital to get fluid off his legs.  Stated that he is taking the Bumex 2mg .  Case mgr did not have exact date of most recent hospitalization.  Last date noted in Epic was 09/08/2017, but she thought was much more recent.  Will send request to [facility] just in case he went there.

## 2017-10-04 NOTE — Telephone Encounter (Signed)
Notes received from [facility] - date of service for this visit was 09/03/2017.  Notes faxed to [place] office for his 10/07/2017 OV with Dr. Purvis Sheffield in [place] office.

## 2017-10-07 ENCOUNTER — Encounter: Payer: Self-pay | Admitting: Cardiovascular Disease

## 2017-10-07 ENCOUNTER — Other Ambulatory Visit: Payer: Self-pay

## 2017-10-07 ENCOUNTER — Ambulatory Visit (INDEPENDENT_AMBULATORY_CARE_PROVIDER_SITE_OTHER): Payer: Medicare Other | Admitting: Cardiovascular Disease

## 2017-10-07 ENCOUNTER — Other Ambulatory Visit: Payer: Self-pay | Admitting: *Deleted

## 2017-10-07 VITALS — BP 166/80 | HR 106 | Ht 72.0 in | Wt 378.0 lb

## 2017-10-07 DIAGNOSIS — Z91148 Patient's other noncompliance with medication regimen for other reason: Secondary | ICD-10-CM

## 2017-10-07 DIAGNOSIS — I10 Essential (primary) hypertension: Secondary | ICD-10-CM | POA: Diagnosis not present

## 2017-10-07 DIAGNOSIS — Z9114 Patient's other noncompliance with medication regimen: Secondary | ICD-10-CM

## 2017-10-07 DIAGNOSIS — Z9289 Personal history of other medical treatment: Secondary | ICD-10-CM | POA: Diagnosis not present

## 2017-10-07 DIAGNOSIS — I482 Chronic atrial fibrillation: Secondary | ICD-10-CM

## 2017-10-07 DIAGNOSIS — I5032 Chronic diastolic (congestive) heart failure: Secondary | ICD-10-CM

## 2017-10-07 DIAGNOSIS — Z9111 Patient's noncompliance with dietary regimen: Secondary | ICD-10-CM | POA: Diagnosis not present

## 2017-10-07 DIAGNOSIS — I428 Other cardiomyopathies: Secondary | ICD-10-CM

## 2017-10-07 DIAGNOSIS — I4821 Permanent atrial fibrillation: Secondary | ICD-10-CM

## 2017-10-07 IMAGING — DX DG CHEST 2V
3 series · 3 of 3 positions shown · non-contrast
Comparison: 01/31/2016

CLINICAL DATA: Shortness of breath, swelling in bilateral legs and
feet for the past few days, but worse today. Hx irregular heartbeat
and CHF

EXAM:
CHEST  2 VIEW

[chest lat (1 of 2)]
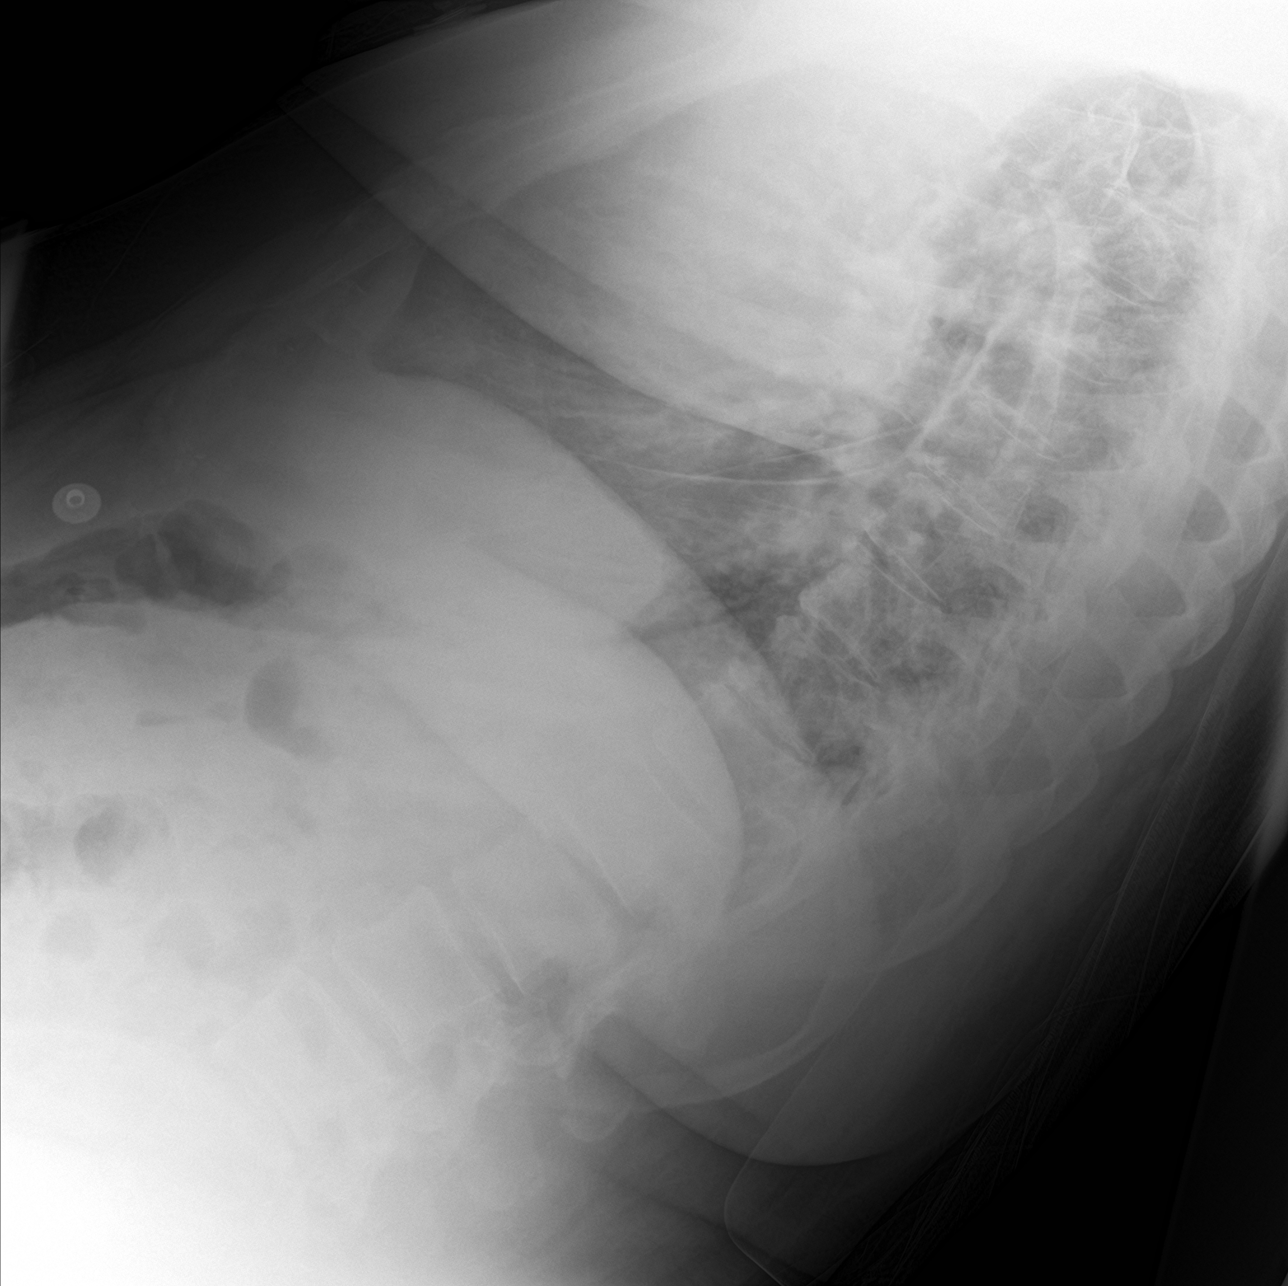

[chest ap]
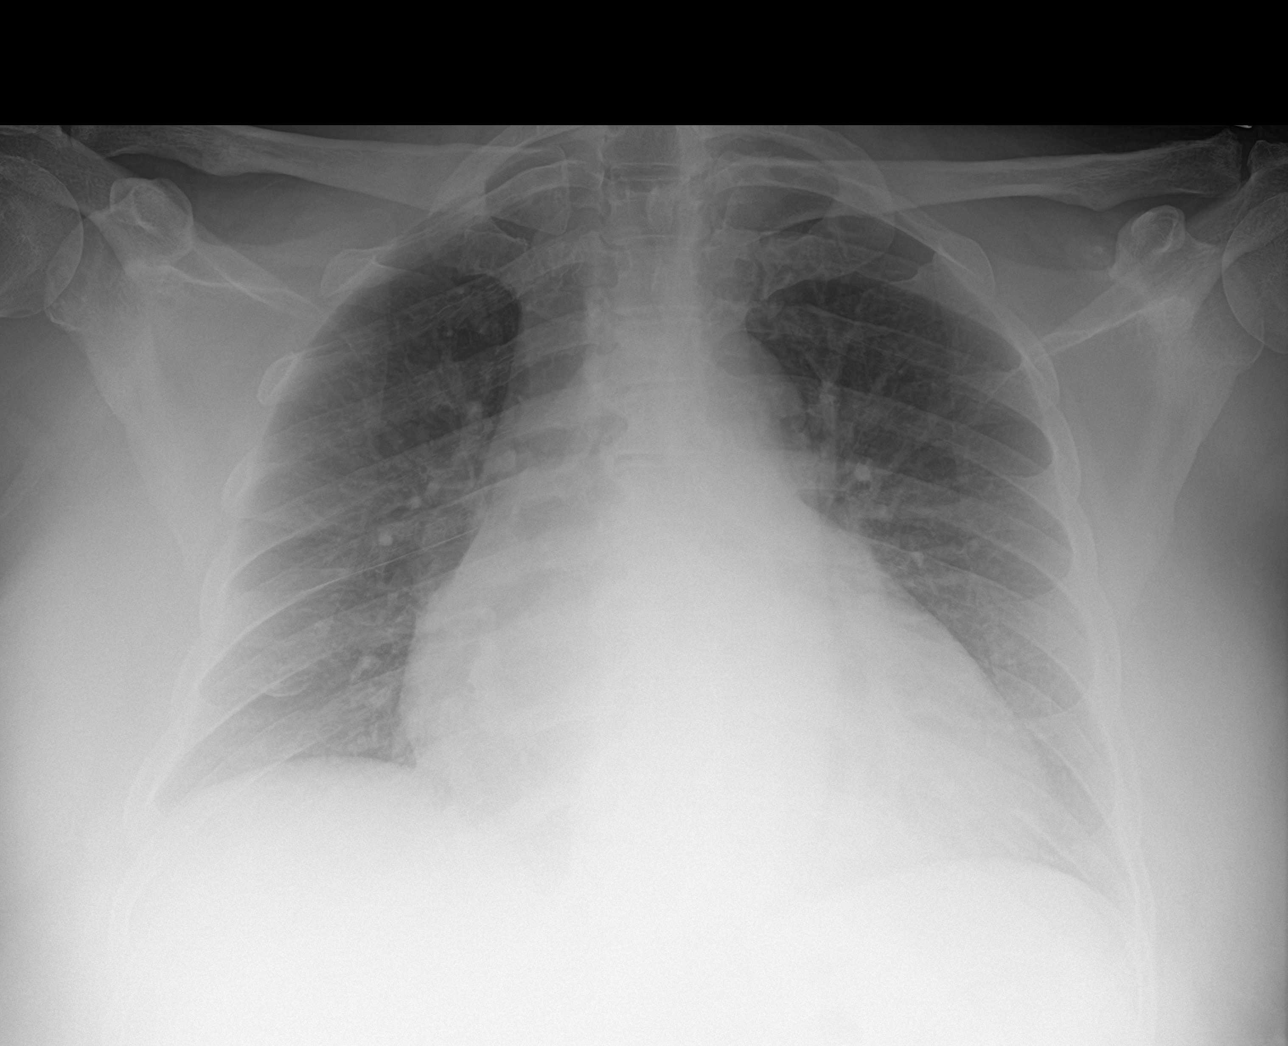

[chest lat (2 of 2)]
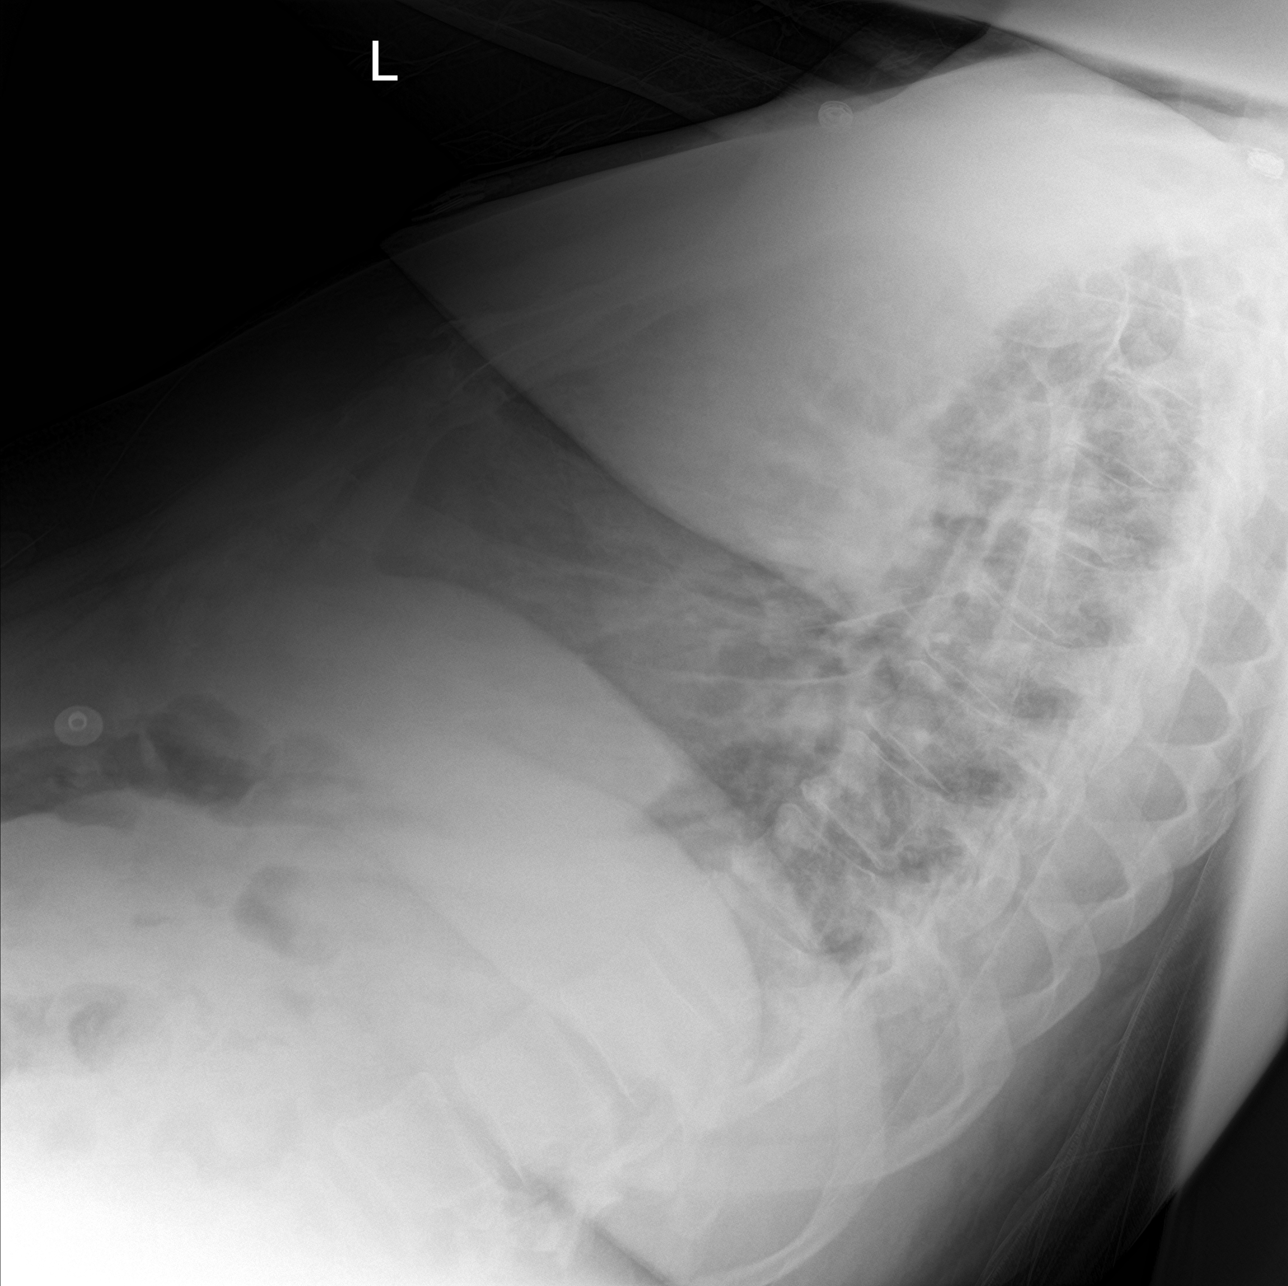

[3 of 3 positions shown; findings below may reference images not displayed]

FINDINGS: The cardiac silhouette is mildly enlarged. Normal mediastinal and
hilar contours.

Clear lungs.  No pleural effusion or pneumothorax.

Bony thorax is intact.
IMPRESSION: 1. No acute cardiopulmonary disease.  Stable mild cardiomegaly.

## 2017-10-07 IMAGING — DX DG CHEST 2V
2 series · 2 of 2 positions shown · non-contrast
Comparison: 01/20/2016

CLINICAL DATA: Shortness of breath for 2-3 days

EXAM:
CHEST  2 VIEW

[chest lat]
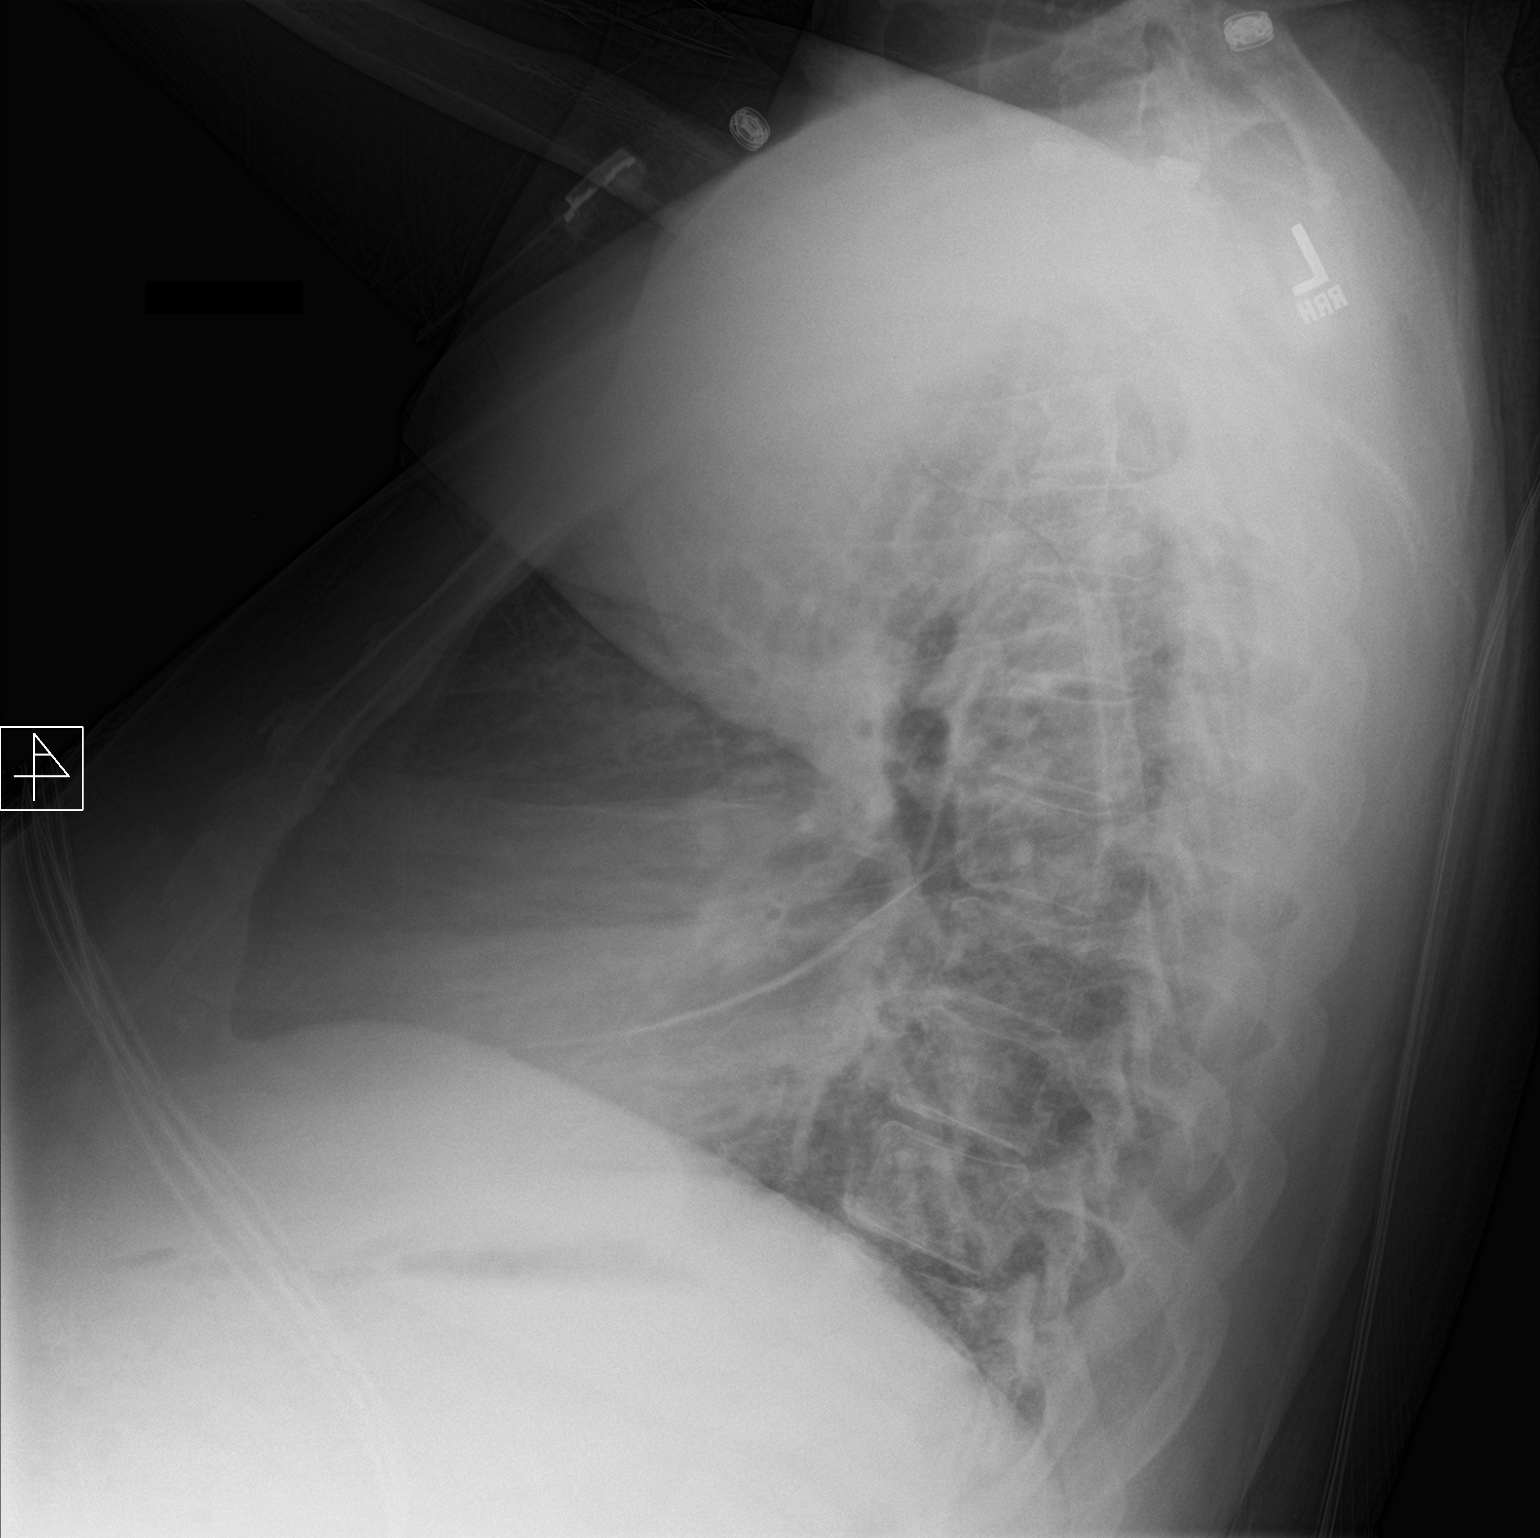

[chest ap]
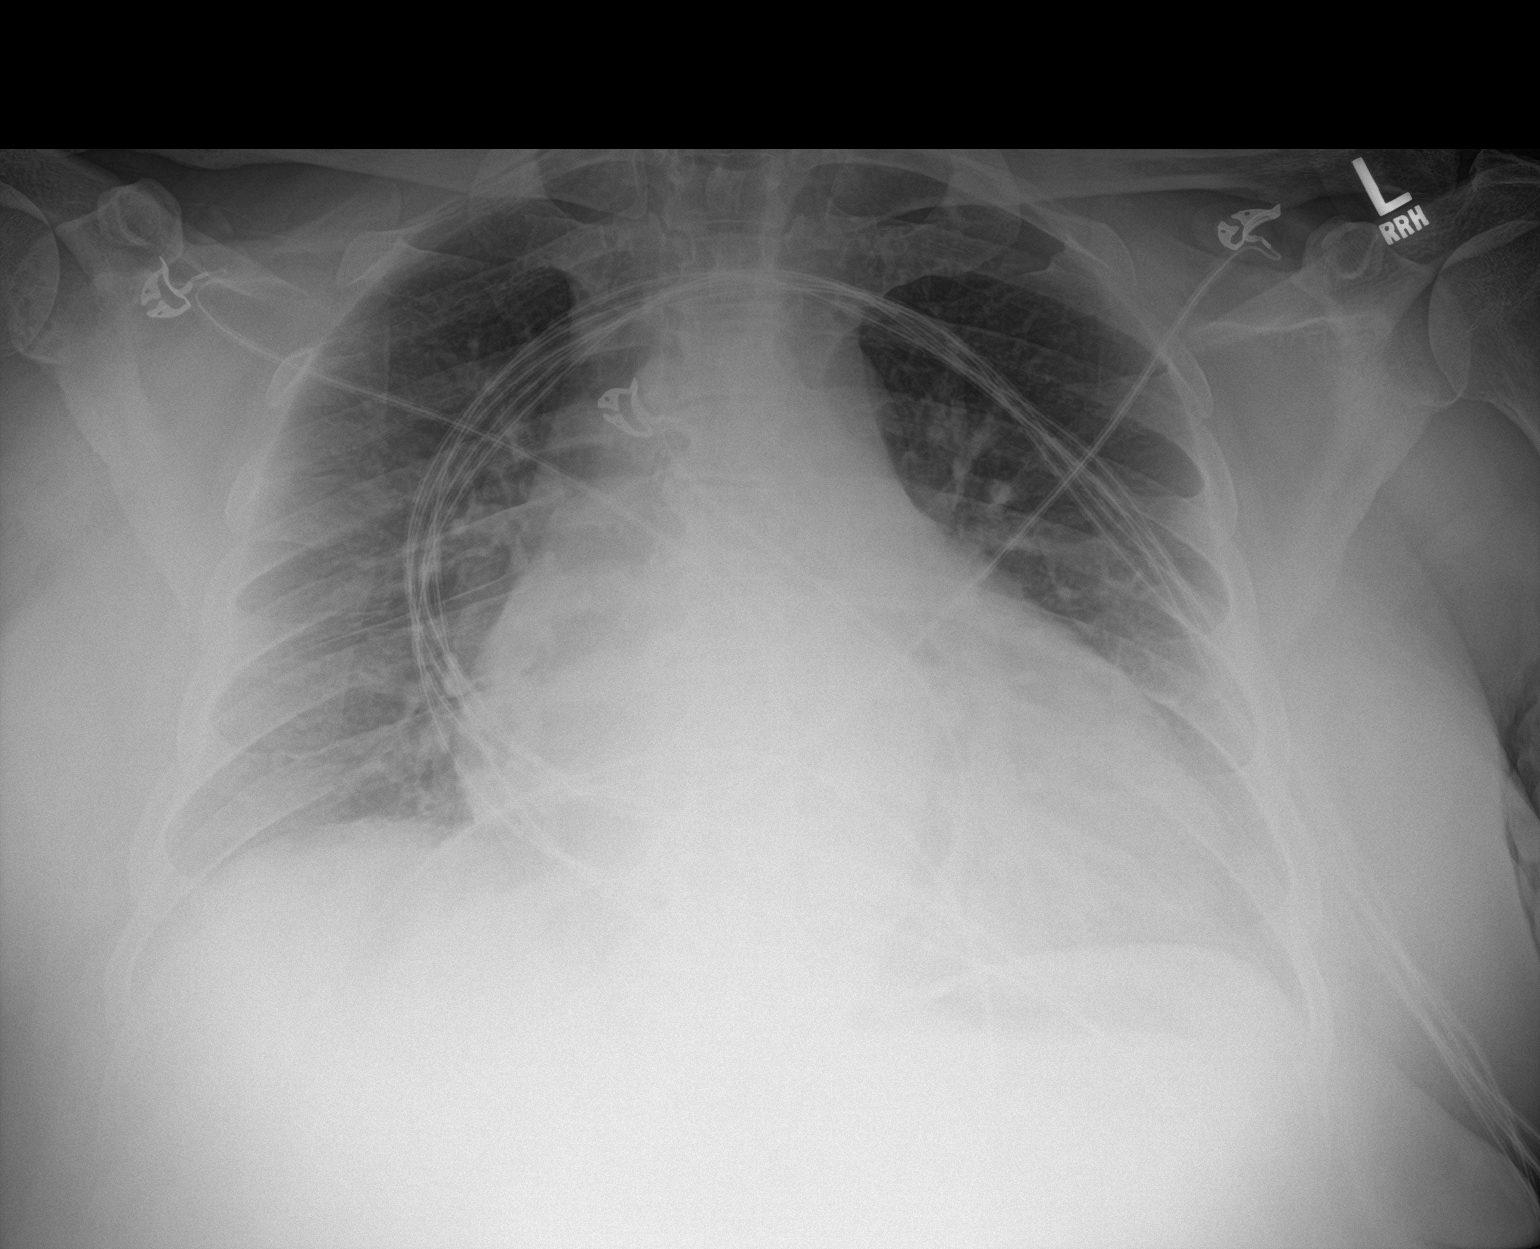

[2 of 2 positions shown; findings below may reference images not displayed]

FINDINGS: Cardiac shadow is enlarged. The lungs are well aerated bilaterally.
No focal infiltrate or sizable effusion is seen. No bony abnormality
is noted.
IMPRESSION: No active cardiopulmonary disease.

## 2017-10-07 MED ORDER — CARVEDILOL 25 MG PO TABS: 37.5000 mg | ORAL_TABLET | Freq: Two times a day (BID) | ORAL | 6 refills | Status: DC

## 2017-10-07 MED ORDER — LISINOPRIL 5 MG PO TABS: 5.0000 mg | ORAL_TABLET | Freq: Every day | ORAL | 9 refills | Status: DC

## 2017-10-07 NOTE — Patient Instructions (Signed)
Your physician wants you to follow-up in: 6 months with [place] PA-C You will receive a reminder letter in the mail two months in advance. If you don't receive a letter, please call our office to schedule the follow-up appointment.     INCREASE Coreg to 37.5 mg (1 1/2 tablets of the 25 mg dose)  twice a day    Please take Lisinopril 5 mg daily   No lab work or tests ordered today   Thank you for choosing [facility] !

## 2017-10-07 NOTE — Patient Outreach (Signed)
[name] [facility]) Care Management  10/07/2017  [name] 05/12/1966 [id number]   CV office visit with patient- completing transition of care services   [facility] CM met Samuel [name] at Dr Court Joy office. Wt today is 378 lbs BP 16/80 pulse 106 Dr Bronson Ing discussed [name]' medication and dietary noncompliance for his CHF, atrial fibrillation, HTN, nonischemic cardiomyopathy LVEF 20-25% and pmh heart attack and cardiac catheterization.  Dr Bronson Ing has worked with [name] for many years. Medications reviewed to include medications he is not presently taking that is being recommended. Cm voiced concern with [name] home management medication usage.  [name] confirms he knows he is not being compliant with Dr Bronson Ing recommendations.  [name] agreed to try CHF clinic but mentioned transportation concerns.  CM and CV RN discussed resources to resolve transportation concerns.  Plans to follow up with [name] in 1 week  Joelene Millin L. Lavina Hamman, RN, BSN, [place] Coordinator [phone] week day mobile

## 2017-10-07 NOTE — Progress Notes (Signed)
SUBJECTIVE: The patient presents for posthospitalization follow-up.  He is notorious for medication and dietary noncompliance.  He was most recently hospitalized at [facility] in March for acute on chronic combined systolic and diastolic heart failure.  Today he is here with his [facility] RN case Production designer, theatre/television/film.  He is not taking lisinopril because he believes it interacts with his potassium.  However, he is not taking potassium either.  He reports taking carvedilol, Bumex, and spironolactone.  Chronic lower extremity edema is stable.  Potassium was 3.9 on 09/11/17.  He has chronic exertional dyspnea.  He says he is trying to avoid salt.  His case manager has questions about the patient's self-reported side effects to several medications.  I informed her that I have known this patient for years now and have treated him both in the outpatient and inpatient settings and his cardiac dysfunction is directly related to his medication and overall management noncompliance.     Review of Systems: As per "subjective", otherwise negative.  Allergies  Allergen Reactions  . Banana Shortness Of Breath  . Bee Venom Shortness Of Breath, Swelling and Other (See Comments)    Reaction:  Facial swelling  . Food Shortness Of Breath, Rash and Other (See Comments)    Pt states that he is allergic to strawberries.    . Aspirin Other (See Comments)    Reaction:  GI upset   . Metolazone Other (See Comments)    Pt states that he stopped taking this med due to heart attack like symptoms.   . Oatmeal Nausea And Vomiting  . Orange Juice [Orange Oil] Nausea And Vomiting    All acidic products make him nauseous and upset stomach  . Torsemide Swelling and Other (See Comments)    Reaction:  Swelling of feet/legs   . Diltiazem Palpitations  . Hydralazine Palpitations  . Lipitor [Atorvastatin] Other (See Comments)    Reaction:  Nose bleeds     Current Outpatient Medications  Medication Sig Dispense Refill  . albuterol  (PROVENTIL) (2.5 MG/3ML) 0.083% nebulizer solution Take 2.5 mg by nebulization every 6 (six) hours as needed for wheezing or shortness of breath.    Marland Kitchen aspirin 81 MG chewable tablet Chew 1 tablet (81 mg total) by mouth daily.    Marland Kitchen atorvastatin (LIPITOR) 40 MG tablet Take 1 tablet (40 mg total) by mouth daily at 6 PM. 30 tablet 0  . beclomethasone (QVAR) 80 MCG/ACT inhaler Inhale 2 puffs into the lungs 2 (two) times daily.     . bumetanide (BUMEX) 2 MG tablet Take 2 mg by mouth 2 (two) times daily.    . carvedilol (COREG) 25 MG tablet Take 1 tablet (25 mg total) by mouth 2 (two) times daily with a meal. 60 tablet 1  . docusate sodium (COLACE) 100 MG capsule Take 1 capsule (100 mg total) by mouth 2 (two) times daily. (Patient taking differently: Take 100 mg by mouth 2 (two) times daily as needed for mild constipation. ) 10 capsule 0  . ferrous gluconate (FERGON) 324 MG tablet Take 1 tablet (324 mg total) by mouth 2 (two) times daily with a meal. (Patient taking differently: Take 324 mg by mouth daily with breakfast. ) 60 tablet 0  . lisinopril (PRINIVIL,ZESTRIL) 5 MG tablet Take 1 tablet (5 mg total) by mouth daily. 30 tablet 0  . OXYGEN Inhale 3.5 L into the lungs daily.    . pantoprazole (PROTONIX) 40 MG tablet Take 1 tablet (40 mg total)  by mouth daily at 6 (six) AM. (Patient taking differently: Take 40 mg by mouth every evening. ) 30 tablet 0  . potassium chloride (K-DUR,KLOR-CON) 20 MEQ tablet Take 1 tablet (20 mEq total) by mouth daily. 30 tablet 1  . rivaroxaban (XARELTO) 20 MG TABS tablet Take 1 tablet (20 mg total) by mouth daily with breakfast. (Patient taking differently: Take 20 mg by mouth daily with supper. ) 30 tablet 0  . spironolactone (ALDACTONE) 50 MG tablet Take 50 mg daily by mouth. For fluid  12  . tamsulosin (FLOMAX) 0.4 MG CAPS capsule Take 1 capsule (0.4 mg total) by mouth daily. 30 capsule 0   No current facility-administered medications for this visit.     Past Medical  History:  Diagnosis Date  . Allergic rhinitis   . Asthma   . Atrial fibrillation (HCC)   . CHF (congestive heart failure) (HCC)   . Essential hypertension   . Gastroesophageal reflux disease   . Heart attack (HCC)   . History of cardiac catheterization    Normal coronaries December 2016  . Noncompliance    Major problem leading to declining health and recurrent hospitalization  . Nonischemic cardiomyopathy (HCC)    LVEF 20-25%  . On home O2    3L N/C  . OSA (obstructive sleep apnea)   . Osteoarthritis   . Peptic ulcer disease     Past Surgical History:  Procedure Laterality Date  . CARDIAC CATHETERIZATION N/A 06/14/2015   Procedure: Right/Left Heart Cath and Coronary Angiography;  Surgeon: Runell Gess, MD;  Location: [facility] INVASIVE CV LAB;  Service: Cardiovascular;  Laterality: N/A;    Social History   Socioeconomic History  . Marital status: Divorced    Spouse name: Not on file  . Number of children: 3  . Years of education: Not on file  . Highest education level: Not on file  Occupational History  . Occupation: disabled  Social Needs  . Financial resource strain: Not on file  . Food insecurity:    Worry: Not on file    Inability: Not on file  . Transportation needs:    Medical: Not on file    Non-medical: Not on file  Tobacco Use  . Smoking status: Former Smoker    Packs/day: 0.50    Years: 20.00    Pack years: 10.00    Types: Cigarettes    Start date: 04/26/1988    Last attempt to quit: 06/23/2007    Years since quitting: 10.2  . Smokeless tobacco: Never Used  . Tobacco comment: 1 ppd former smoker  Substance and Sexual Activity  . Alcohol use: No    Alcohol/week: 0.0 oz    Comment: No etoh since 2009  . Drug use: No  . Sexual activity: Never  Lifestyle  . Physical activity:    Days per week: Not on file    Minutes per session: Not on file  . Stress: Not on file  Relationships  . Social connections:    Talks on phone: Not on file    Gets  together: Not on file    Attends religious service: Not on file    Active member of club or organization: Not on file    Attends meetings of clubs or organizations: Not on file    Relationship status: Not on file  . Intimate partner violence:    Fear of current or ex partner: Not on file    Emotionally abused: Not on file  Physically abused: Not on file    Forced sexual activity: Not on file  Other Topics Concern  . Not on file  Social History Narrative  . Not on file     Vitals:   10/07/17 0813  BP: (!) 166/80  Pulse: (!) 106  SpO2: 94%  Weight: (!) 378 lb (171.5 kg)  Height: 6' (1.829 m)    Wt Readings from Last 3 Encounters:  10/07/17 (!) 378 lb (171.5 kg)  09/17/17 (!) 370 lb (167.8 kg)  09/11/17 (!) 359 lb 5.6 oz (163 kg)     PHYSICAL EXAM General: NAD HEENT: Normal.  Wearing sunglasses. Neck: No JVD, no thyromegaly. Lungs: Clear to auscultation bilaterally with normal respiratory effort. CV: Mildly tachycardic, irregular rhythm, normal S1/S2, no S3, no murmur.  Chronic bilateral lower extremity edema with lipodermatosclerosis. Abdomen: Morbidly obese. Neurologic: Alert and oriented.  Psych: Normal affect. Skin: Bilateral lower extremity lipodermatosclerosis. Musculoskeletal: No gross deformities.    ECG: Most recent ECG reviewed.   Labs: Lab Results  Component Value Date/Time   K 3.9 09/11/2017 05:59 AM   BUN 22 (H) 09/11/2017 05:59 AM   CREATININE 1.22 09/11/2017 05:59 AM   CREATININE 1.02 07/07/2011 09:09 AM   ALT 18 09/08/2017 01:40 PM   TSH 2.954 12/02/2016 08:55 PM   TSH 0.322 (L) 04/02/2014 10:27 AM   HGB 12.2 (L) 09/09/2017 04:19 AM     Lipids: Lab Results  Component Value Date/Time   LDLCALC 99 06/14/2015 03:28 AM   CHOL 139 06/14/2015 03:28 AM   TRIG 58 06/14/2015 03:28 AM   HDL 28 (L) 06/14/2015 03:28 AM       ASSESSMENT AND PLAN: 1.  Chronic diastolic heart failure: Blood pressure is elevated.  Heart rate is elevated as  well.  I will increase carvedilol to 37.5 mg twice daily and encouraged him to start taking lisinopril 5 mg daily as this will help to prevent recurrent heart failure exacerbations.  He has been prescribed Bumex 2 mg twice daily.  He tends to take medications as he wishes.  We will arrange for him to be seen in the advanced heart failure clinic in [place].  2.  Nonischemic cardiomyopathy: Left ventricular systolic function normalized by echocardiogram on 05/09/17, LVEF 55%.  Normal coronaries in December 2016.  3.  Permanent atrial fibrillation: Heart rate is mildly elevated.  I will increase carvedilol to 37.5 mg twice daily.  Anticoagulated with Xarelto.    4.  Chronic hypertension: Blood pressure is markedly elevated due to medication noncompliance. I will increase carvedilol to 37.5 mg twice daily and encouraged him to start taking lisinopril 5 mg daily.  5.  Medication and treatment noncompliance: He was again encouraged to take his medications as prescribed.  He claims he is adhering to a low-sodium diet.  I sincerely have my doubts.     Disposition: Follow up 6 months   Prentice Docker, M.D., F.A.C.C.

## 2017-10-09 DIAGNOSIS — I5043 Acute on chronic combined systolic (congestive) and diastolic (congestive) heart failure: Secondary | ICD-10-CM | POA: Diagnosis not present

## 2017-10-09 IMAGING — DX DG CHEST 2V
2 series · 2 of 2 positions shown · non-contrast
Comparison: 01/31/2016

CLINICAL DATA: Shortness of breath since yesterday evening, history
hypertension, acute on chronic CHF, non ischemic cardiomyopathy,
atrial fibrillation, asthma, former smoker

EXAM:
CHEST  2 VIEW

[chest lat]
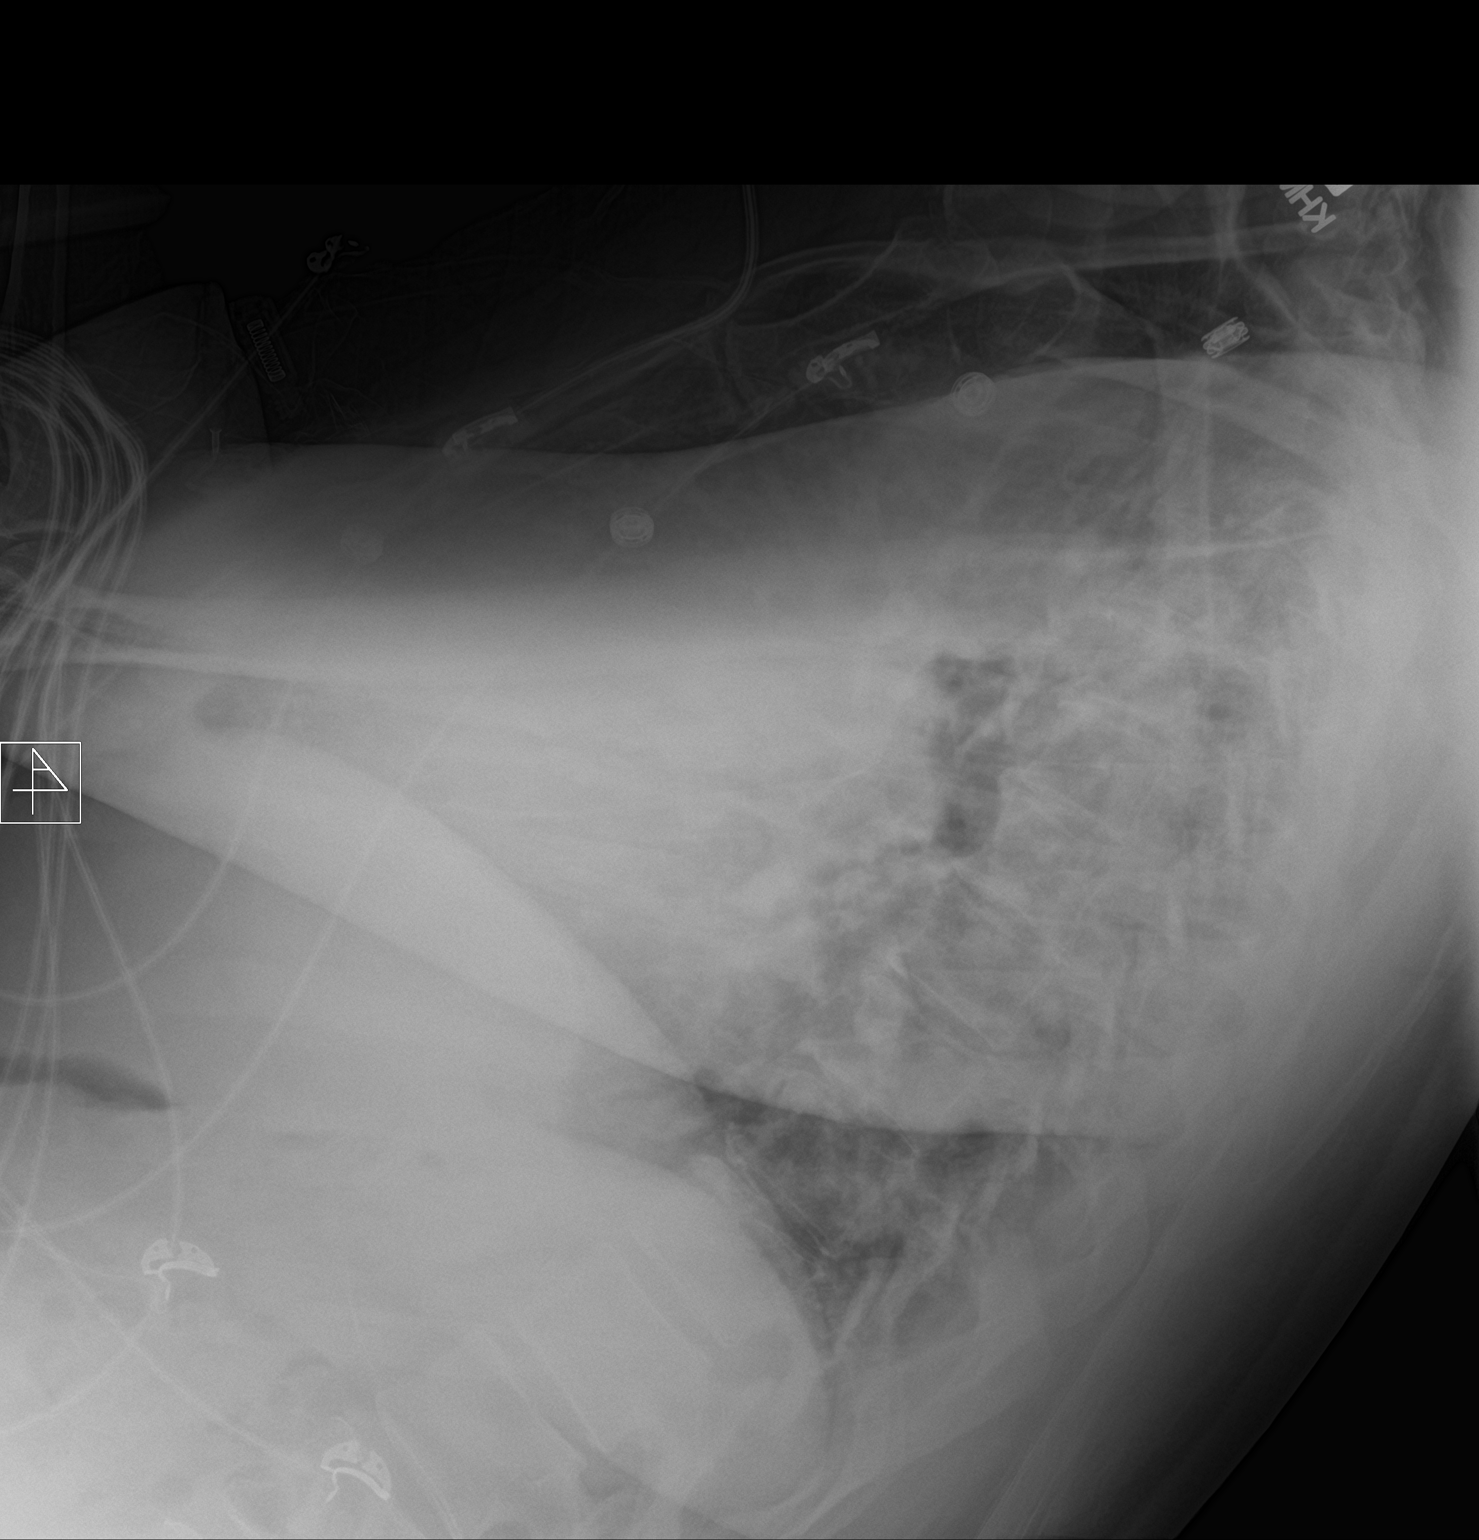

[chest ap]
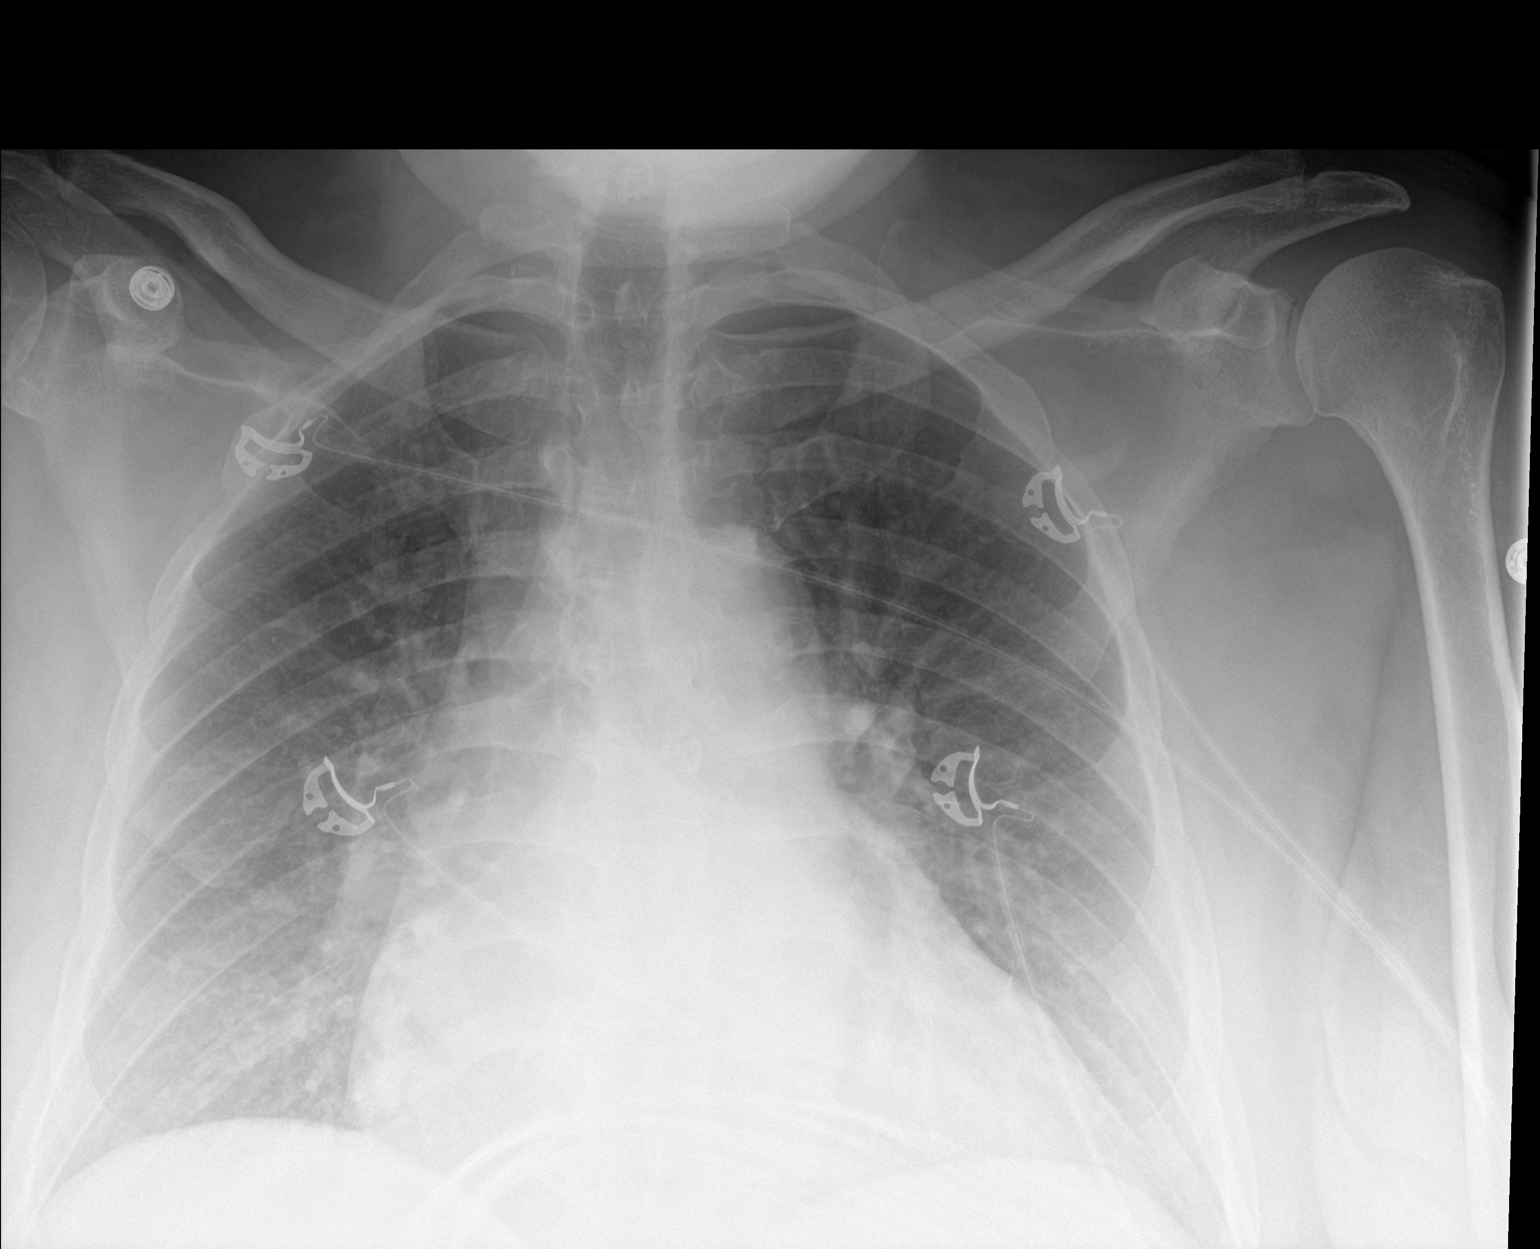

[2 of 2 positions shown; findings below may reference images not displayed]

FINDINGS: Enlargement of cardiac silhouette with pulmonary vascular
congestion.

Mediastinal contours normal.

No definite acute infiltrate, pleural effusion or pneumothorax.

Bones demineralized.
IMPRESSION: Enlargement of cardiac silhouette with pulmonary vascular
congestion.

No acute infiltrate.

## 2017-10-12 ENCOUNTER — Telehealth (HOSPITAL_COMMUNITY): Payer: Self-pay | Admitting: Internal Medicine

## 2017-10-12 NOTE — Telephone Encounter (Signed)
Called patient to give him New CHF appt info.  No answer and no machine.   Will call back to try to reach patient.

## 2017-10-13 DIAGNOSIS — Z79899 Other long term (current) drug therapy: Secondary | ICD-10-CM | POA: Diagnosis not present

## 2017-10-13 DIAGNOSIS — E785 Hyperlipidemia, unspecified: Secondary | ICD-10-CM | POA: Diagnosis not present

## 2017-10-13 DIAGNOSIS — I10 Essential (primary) hypertension: Secondary | ICD-10-CM | POA: Diagnosis not present

## 2017-10-14 ENCOUNTER — Telehealth: Payer: Self-pay | Admitting: Cardiovascular Disease

## 2017-10-14 ENCOUNTER — Other Ambulatory Visit: Payer: Self-pay | Admitting: *Deleted

## 2017-10-14 DIAGNOSIS — E785 Hyperlipidemia, unspecified: Secondary | ICD-10-CM | POA: Diagnosis not present

## 2017-10-14 DIAGNOSIS — I509 Heart failure, unspecified: Secondary | ICD-10-CM | POA: Diagnosis not present

## 2017-10-14 DIAGNOSIS — E876 Hypokalemia: Secondary | ICD-10-CM | POA: Diagnosis not present

## 2017-10-14 DIAGNOSIS — I5020 Unspecified systolic (congestive) heart failure: Secondary | ICD-10-CM | POA: Diagnosis not present

## 2017-10-14 NOTE — Telephone Encounter (Signed)
Patient refuses to give weight to [facility] rep,she is going to drop him from [facility]

## 2017-10-14 NOTE — Patient Outreach (Addendum)
[facility]) Care Management  10/14/2017  LEA WALBERT Mar 20, 1966 [id number]   Care coordination attempt of CHF treatment plan   [facility] CM was contacted by [facility] at CHF clinic in [facility] aftre not being able to reach Mr Gassman to schedule his first CHF clinic appointment on December 09 2017 at 1120.   CM called Mr Craine and conference Dawn in the call after verifying patient's identity and verbal consent provided  Mr Ignasiak discussed reasons he could not go to CHF clinic to include transportation, stating he believes he will be returning to a local hospital for worsening symptoms, risks of having CHF s/s during a ride on "a Zenaida Niece" from his home to CHF clinic and medications not working effectively at home. CM discussed with Mr Belling and Alvis Lemmings that [facility] SW would be wiling to assist with transportation services to CHF and check with his united health care benefit for transportation services availability. Dawn offered to have the CHF clinic SW to start assisting with transportation services to the CHF clinic. CM discussed with Mr Santillo the treatment plan previously discussed and that has been encouraged weekly with CM contacts and visits is for him to contact Dr Purvis Sheffield office with abnormal CHF s/s for adjustments in his treatment plan (medicines, diet, etc) to assist with prevention of crisis CHF episodes and to collaborate with him to get him on the correct home medication treatment plan.  He confirms on the call he has not contacted Dr Junius Argyle office since 10/07/17 appointment to report changes in his CHF s/s.  He reports today an increase in "fluid", "all he will do is tell me to take an extra dose of my medicine and I have and it has not worked."  [employer] encouraged Mr Lever to place a call to Dr Purvis Sheffield office to share this change in CHF s/s so that an opportunity could be provided to assist him.  CM informed Mr Chiaramonte that CM would call the office if he did not call the  office.    CM discussed a ride in any type of vehicle have the same risks   CM asked Mr Zuercher with Alvis Lemmings remaining on the telephone line if he wished to participate in the CHF clinic program and after repeating the question more than 3 times to him, his reply was "No"  Dawn confirmed his CHF clinic appointment would be cancelled and providers would be updated  CM confirmed with Mr Nesmith that CM would contact Dr Purvis Sheffield office to update them on his CHF yellow zone s/s and preference not to participate in the CHF clinic  CM spoke with Dahlia Client at Dr Purvis Sheffield office to discuss the unsuccessful attempts of CM and Dawn of CHF clinic to get Mr Hubbert scheduled and transported to the CHF clinic. Dahlia Client will update Dr Junius Argyle RN and Dr Purvis Sheffield  CM discussed case closure for non compliance with treatment plan and resources  Mr Laswell is noncompliant with services and resources offered to him for CHF management.  He has a history of non compliance with following his Cardiologist treatment plan and recommendations for CHF management as confirmed by the cardiology providers. He denied wanting to change to another cardiology provider during this CM home visit to him. He agreed and preferred to return to see Dr Purvis Sheffield but continues with non compliance and to not follow recommendations offered during the 10/07/17 appointment Mr Wymore has to CM knowledge not been hospitalized at a [place] facility since 09/11/17 (  33 days)  Plans Case Closure  Letters to patient and MD   Cala Bradford L. Noelle Penner, RN, BSN, CCM [facility] Care Management Care Coordinator [phone] week day mobile

## 2017-10-14 NOTE — Telephone Encounter (Signed)
Per [name] w/ [facility] [phone] -- pt has refused to be seen in the heart failure clinic. Dawn from the clinic called and spoke w/ [name], they had a 3 way conversation w/ [name].   Also, pt is gaining more fluid, please give pt a call w/ instructions for med changes.

## 2017-10-18 ENCOUNTER — Other Ambulatory Visit: Payer: Self-pay | Admitting: Pharmacist

## 2017-10-18 ENCOUNTER — Other Ambulatory Visit: Payer: Self-pay | Admitting: *Deleted

## 2017-10-18 NOTE — Patient Outreach (Addendum)
[facility]) Care Management  10/18/2017  PAYSON LAUNER 18-Jun-1966 [id number]   Care coordination   [facility] CM collaborated with [facility] pharmacist, Verda Cumins Pharmacist updated on 10/07/17 CV office visit and 10/14/17 interaction with Mr Novello and CHF clinic staff   Cala Bradford L. Noelle Penner, RN, BSN, CCM [facility] Care Management Care Coordinator [phone] week day mobile

## 2017-10-18 NOTE — Patient Outreach (Signed)
[facility]) Care Management  10/18/2017  [name] 01-12-1966 [id number]   Successful call placed to [name] today regarding medication management with heart failure therapy.  [name] reports he is doing better with his current regimen of bumetadine and spironolactone although he still has swelling.  He reports he cannot use transportation to the heart failure clinic due to having to use the bathroom too frequently.  He reports that he is interested in using the heart failure clinic in the future once his clinical status has improved.  He denies needing any further assistance from [facility] pharmacy.  I provided my phone number to patient if he needs to reach out to me in the future.   Plan: I will sign-off patient case at this time as patient is not interested in [facility] pharmacy services. I am happy to help in the future as needed.    [name], PharmD, [facility] Clinical Pharmacist [employer] [phone]

## 2017-10-19 ENCOUNTER — Ambulatory Visit: Payer: Self-pay | Admitting: Pharmacist

## 2017-11-08 DIAGNOSIS — I5043 Acute on chronic combined systolic (congestive) and diastolic (congestive) heart failure: Secondary | ICD-10-CM | POA: Diagnosis not present

## 2017-11-08 IMAGING — DX DG CHEST 2V
2 series · 2 of 2 positions shown · non-contrast
Comparison: 02/26/2016

CLINICAL DATA: Increased swelling in bilateral lower extremities
hands and abdomen. Shortness of breath

EXAM:
CHEST  2 VIEW

[chest pa]
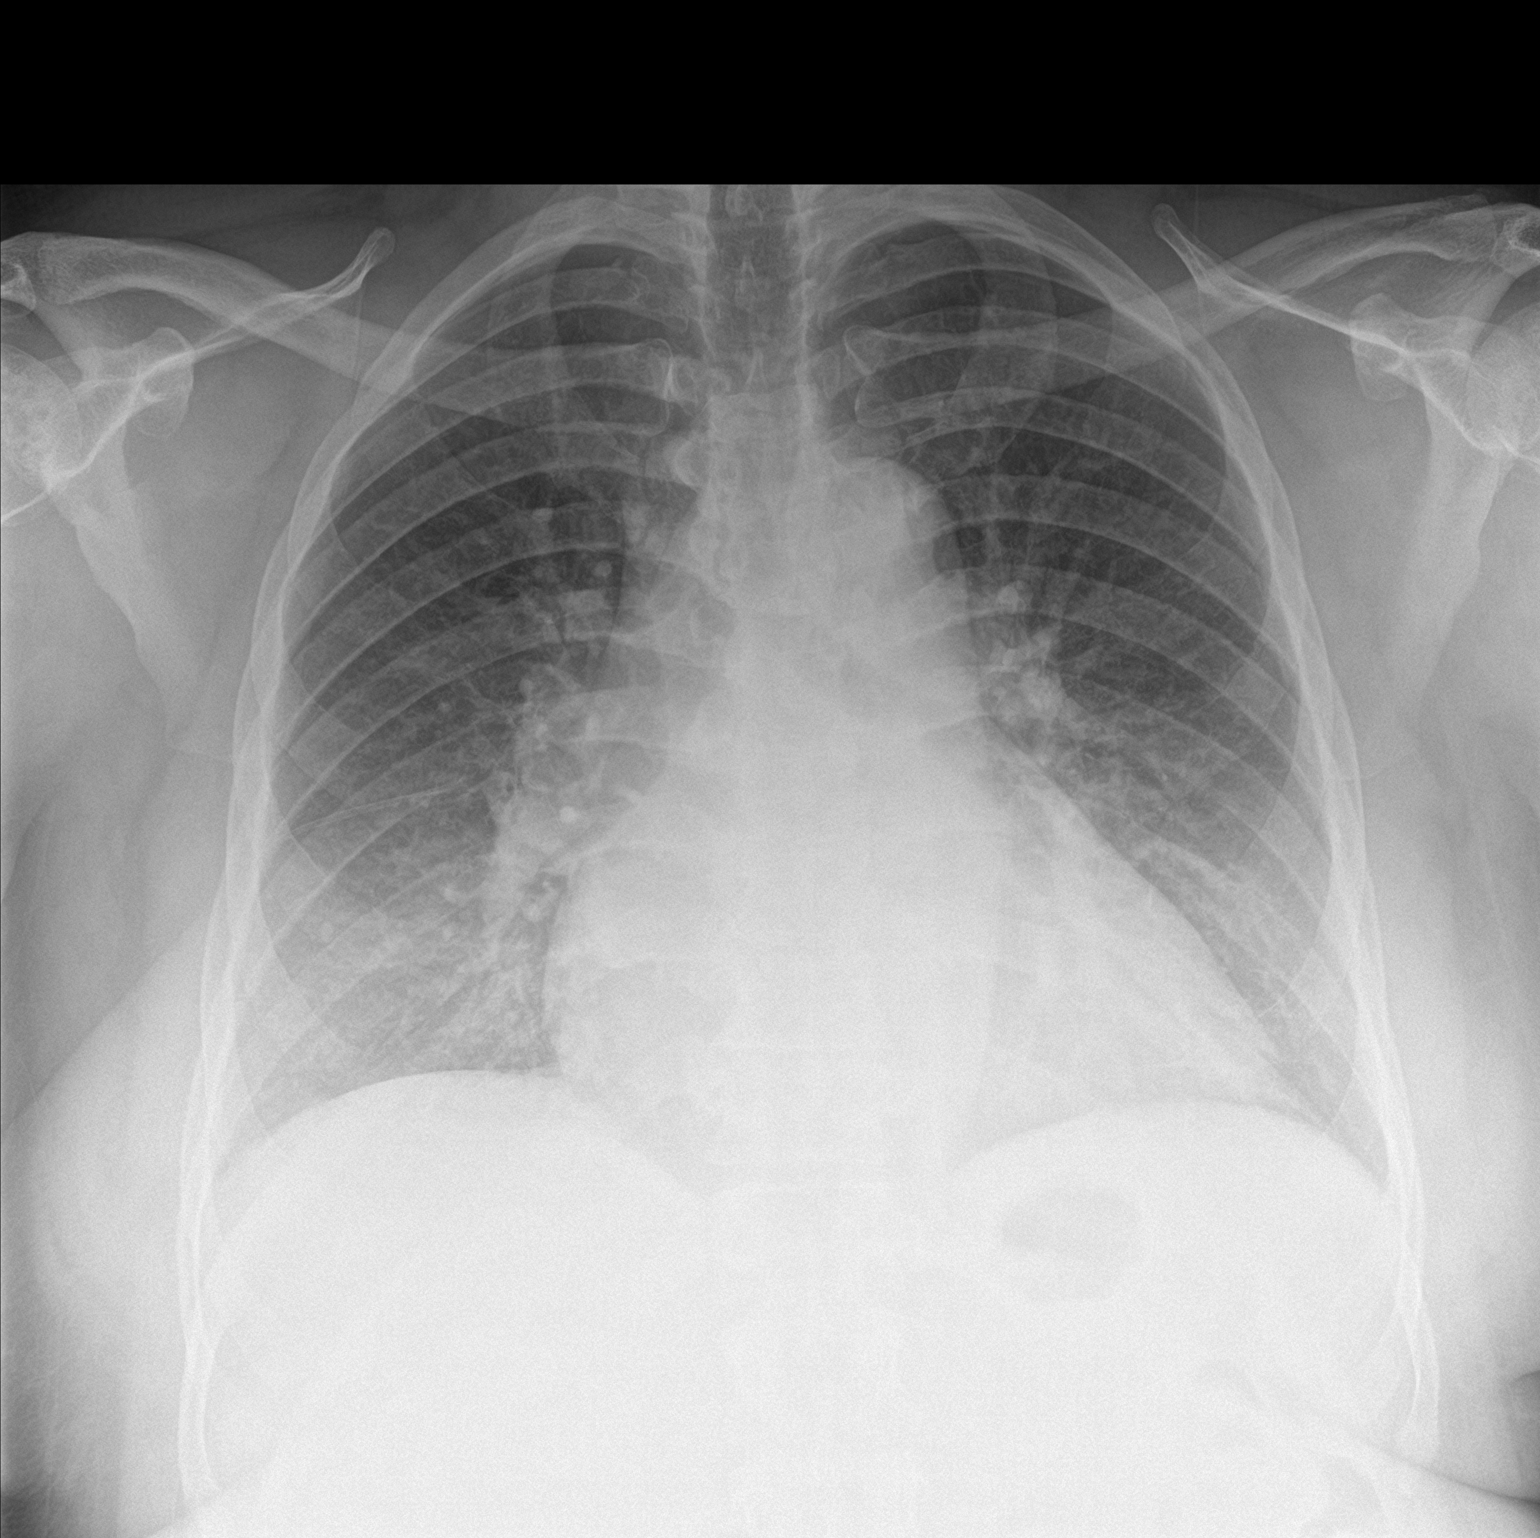

[chest lat]
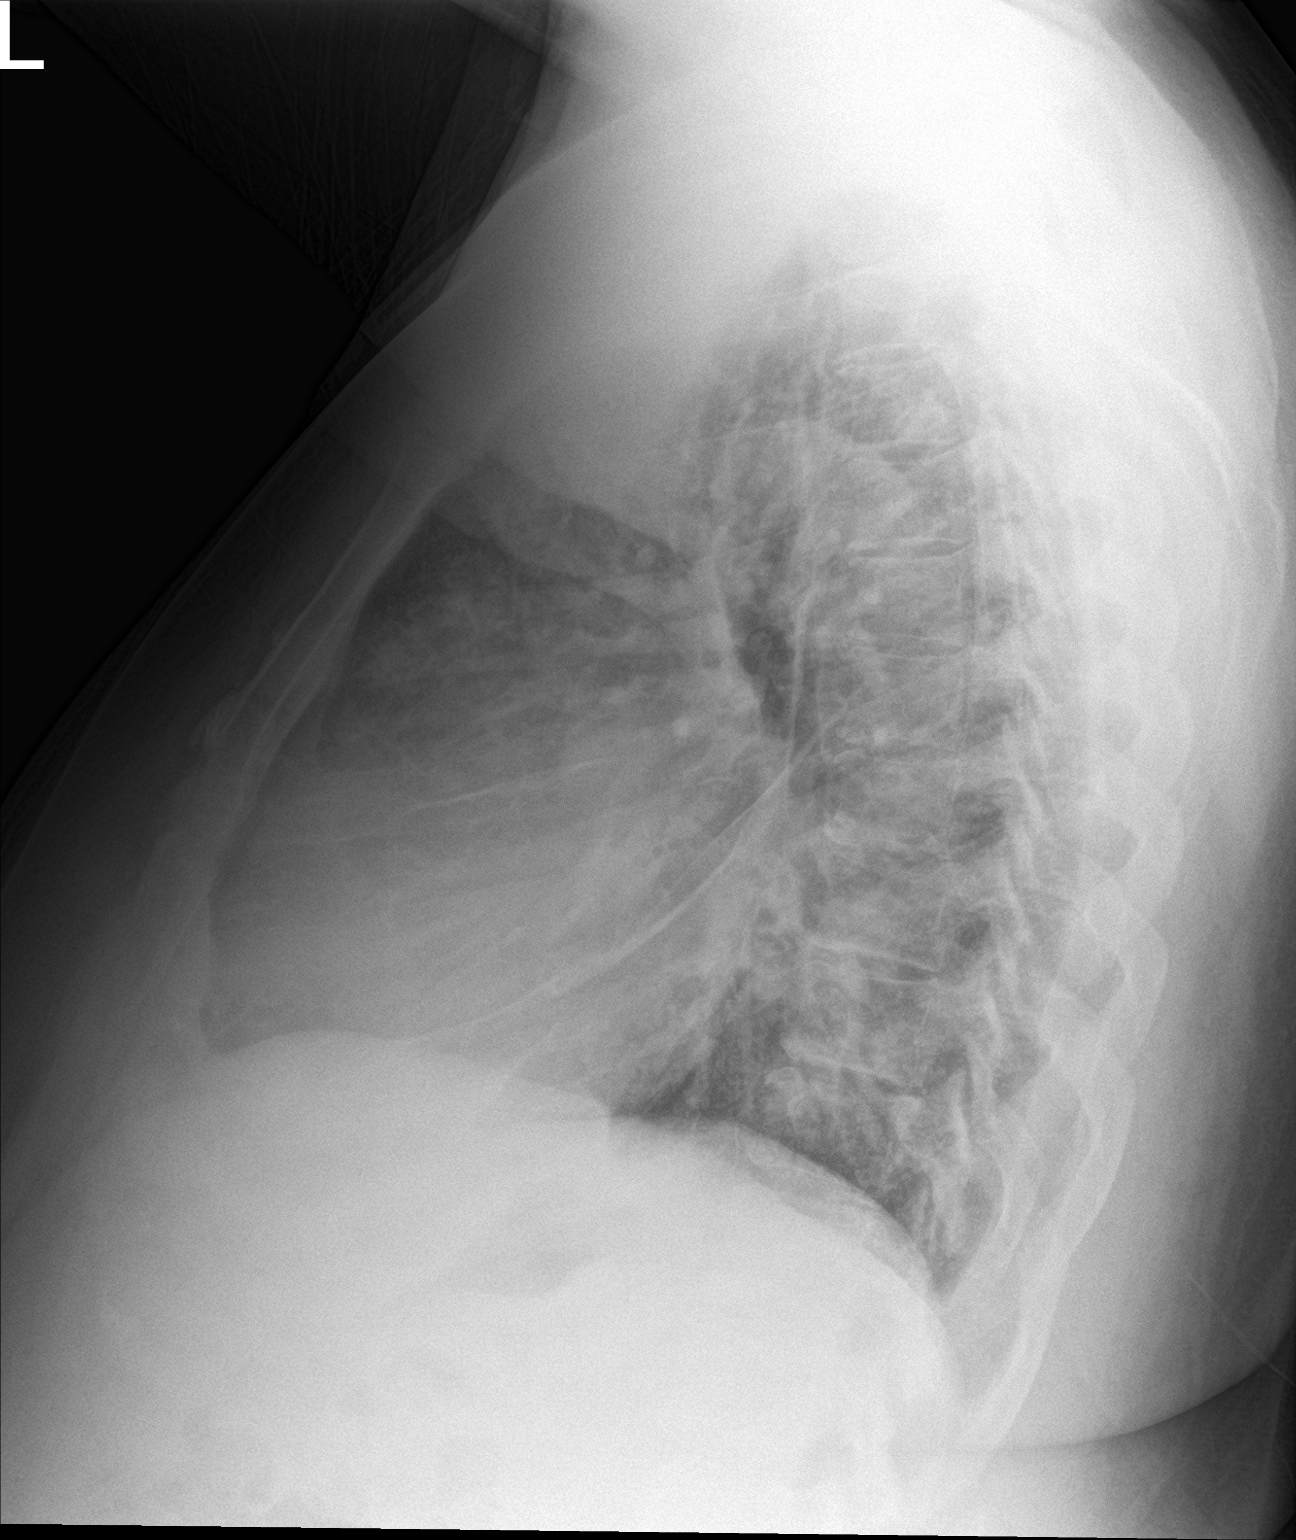

[2 of 2 positions shown; findings below may reference images not displayed]

FINDINGS: PA and lateral views of the chest demonstrate enlarged heart, mildly
decreased compared to prior. There is mild degree of central
vascular congestion with overall decreased pulmonary edema compared
to the prior study. Trace fluid or thickening present along the
fissures on the lateral view. No increasing effusion identified. No
pneumothorax.
IMPRESSION: 1. Mild to moderate cardiomegaly, has slightly decreased compared to
prior; overall improved aeration with decreased pulmonary edema.
Mild central vascular congestion remains.
2. No acute consolidations.

## 2017-11-26 IMAGING — DX DG CHEST 2V
2 series · 2 of 2 positions shown · non-contrast
Comparison: 03/20/2016

CLINICAL DATA: Short of breath.  Leg swelling.

EXAM:
CHEST  2 VIEW

[chest pa]
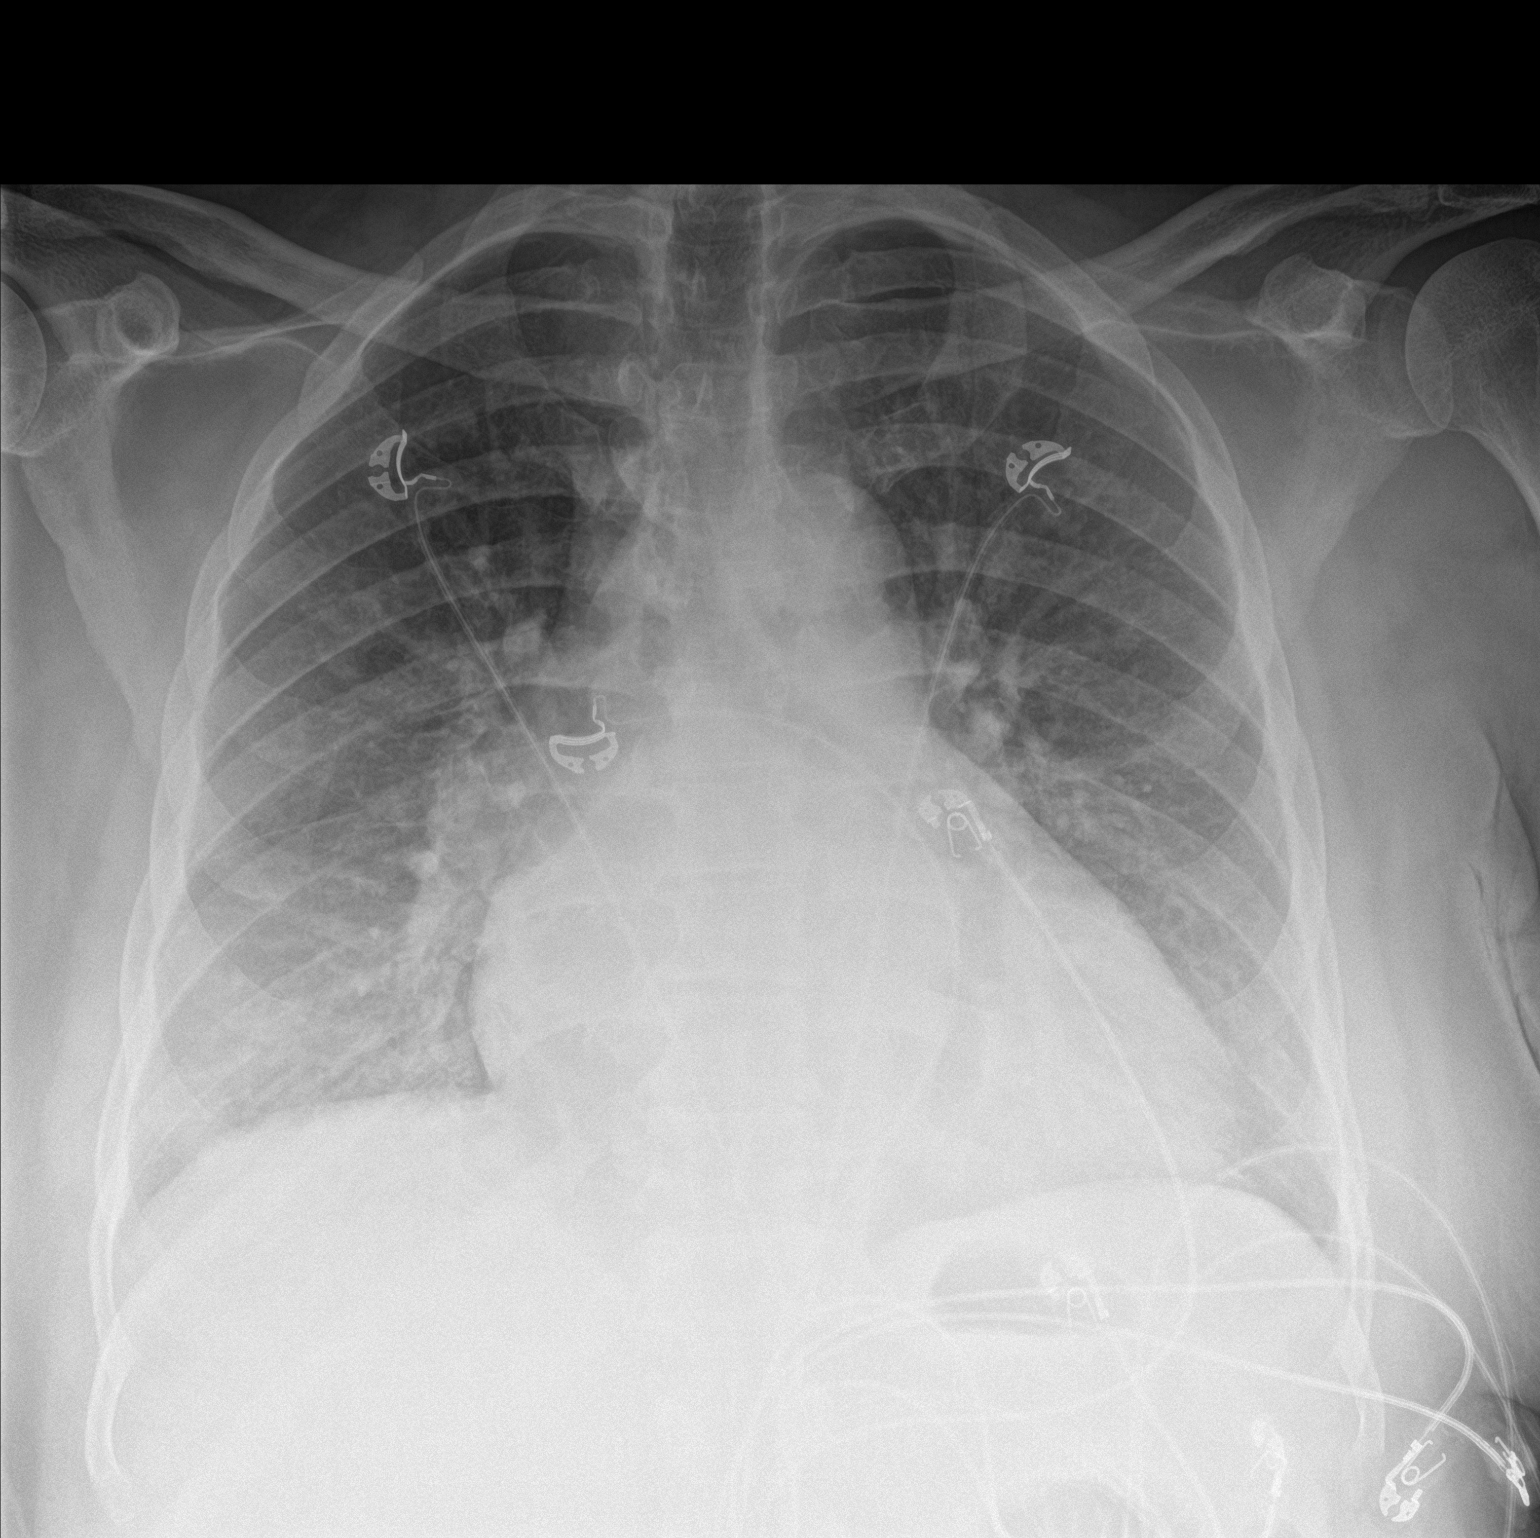

[chest lat]
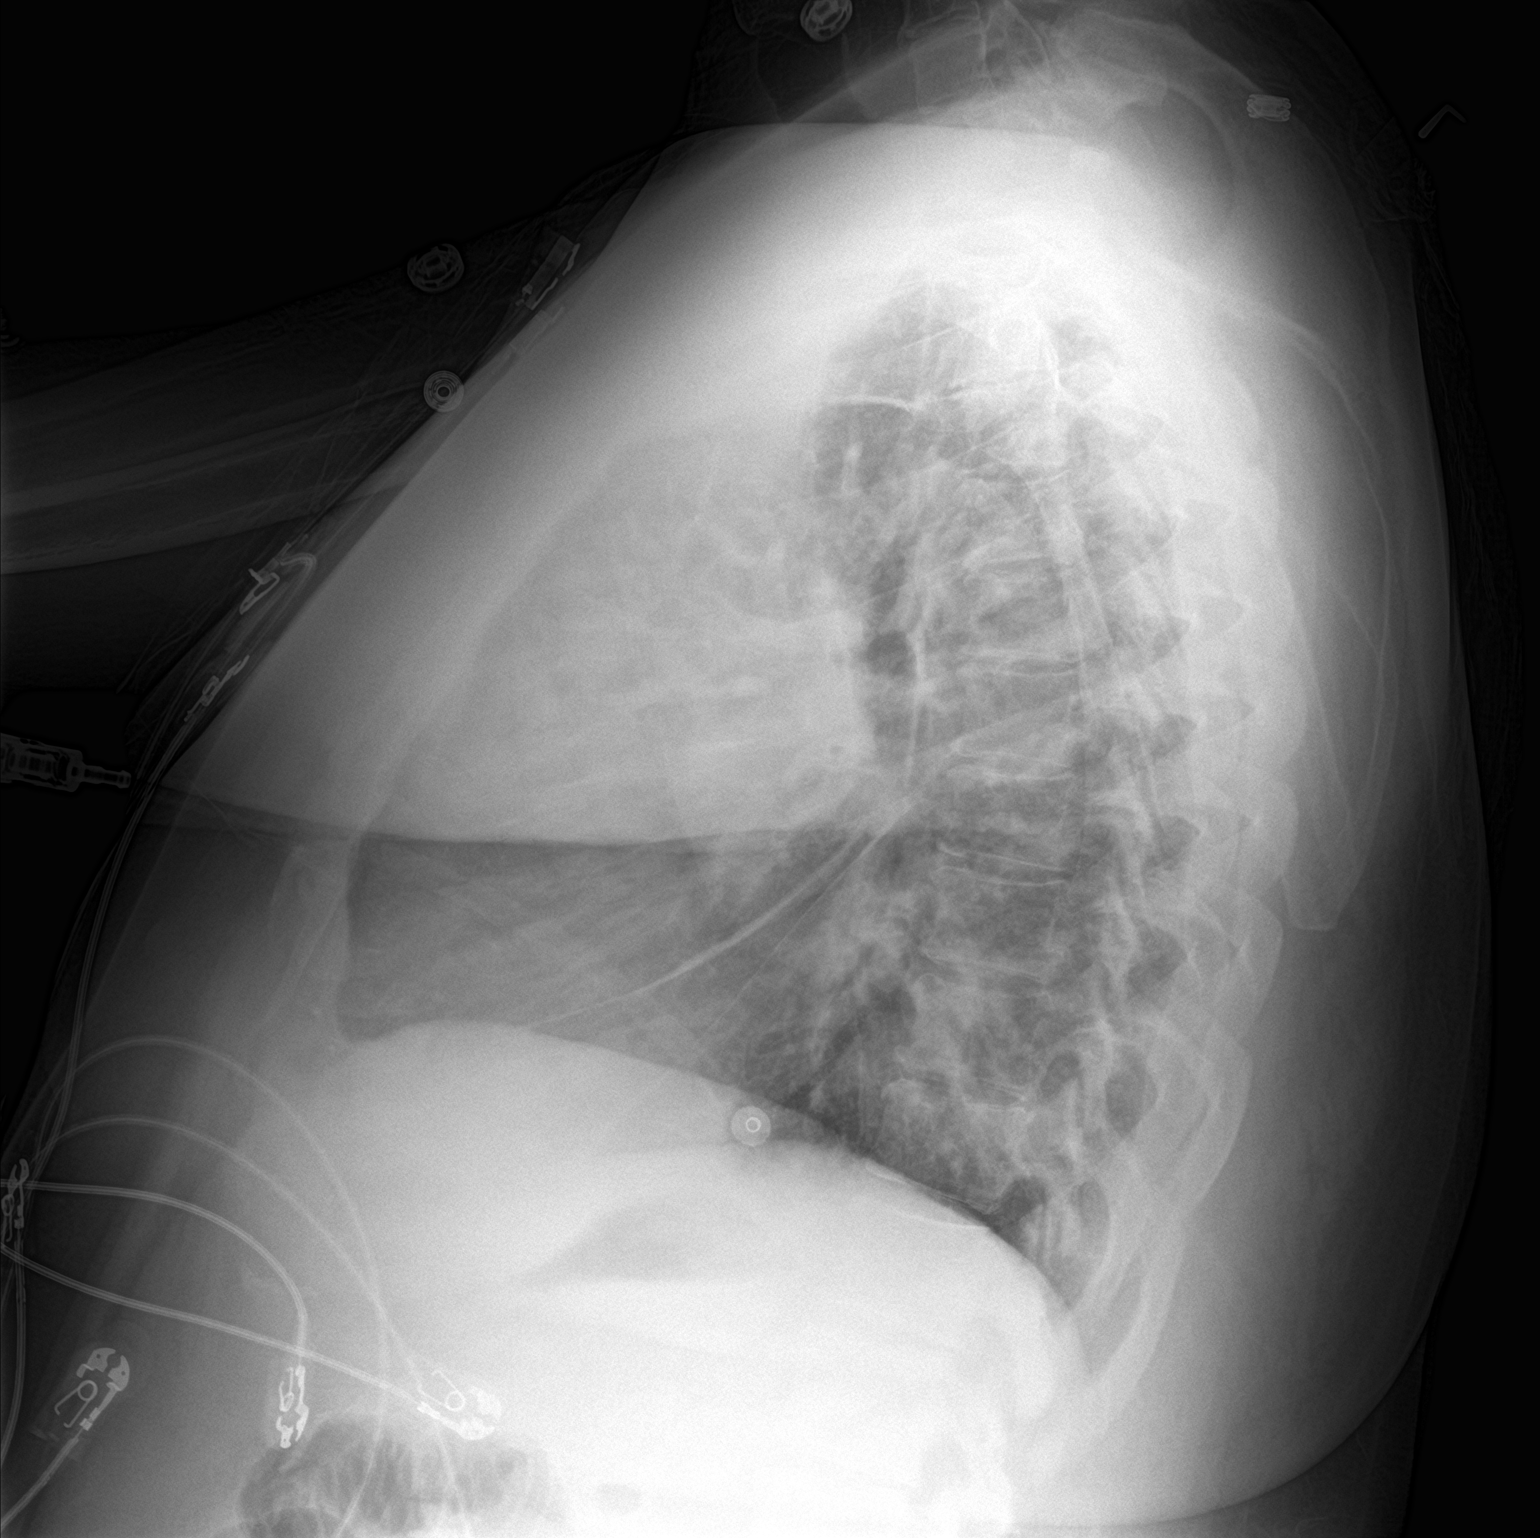

[2 of 2 positions shown; findings below may reference images not displayed]

FINDINGS: Mild enlargement of the cardiac silhouette, stable. No mediastinal
or hilar masses or evidence of adenopathy.

Lungs are clear.  No pleural effusion.  No pneumothorax.

Bony thorax is intact.
IMPRESSION: 1. No acute cardiopulmonary disease.
2. Cardiomegaly.  No convincing congestive heart failure.

## 2017-11-29 IMAGING — CR DG CHEST 2V
2 series · 2 of 2 positions shown · non-contrast
Comparison: 03/21/2016 and 03/20/2016

CLINICAL DATA: Mid chest pressure with shortness of breath.
Bilateral leg swelling.

EXAM:
CHEST  2 VIEW

[chest pa]
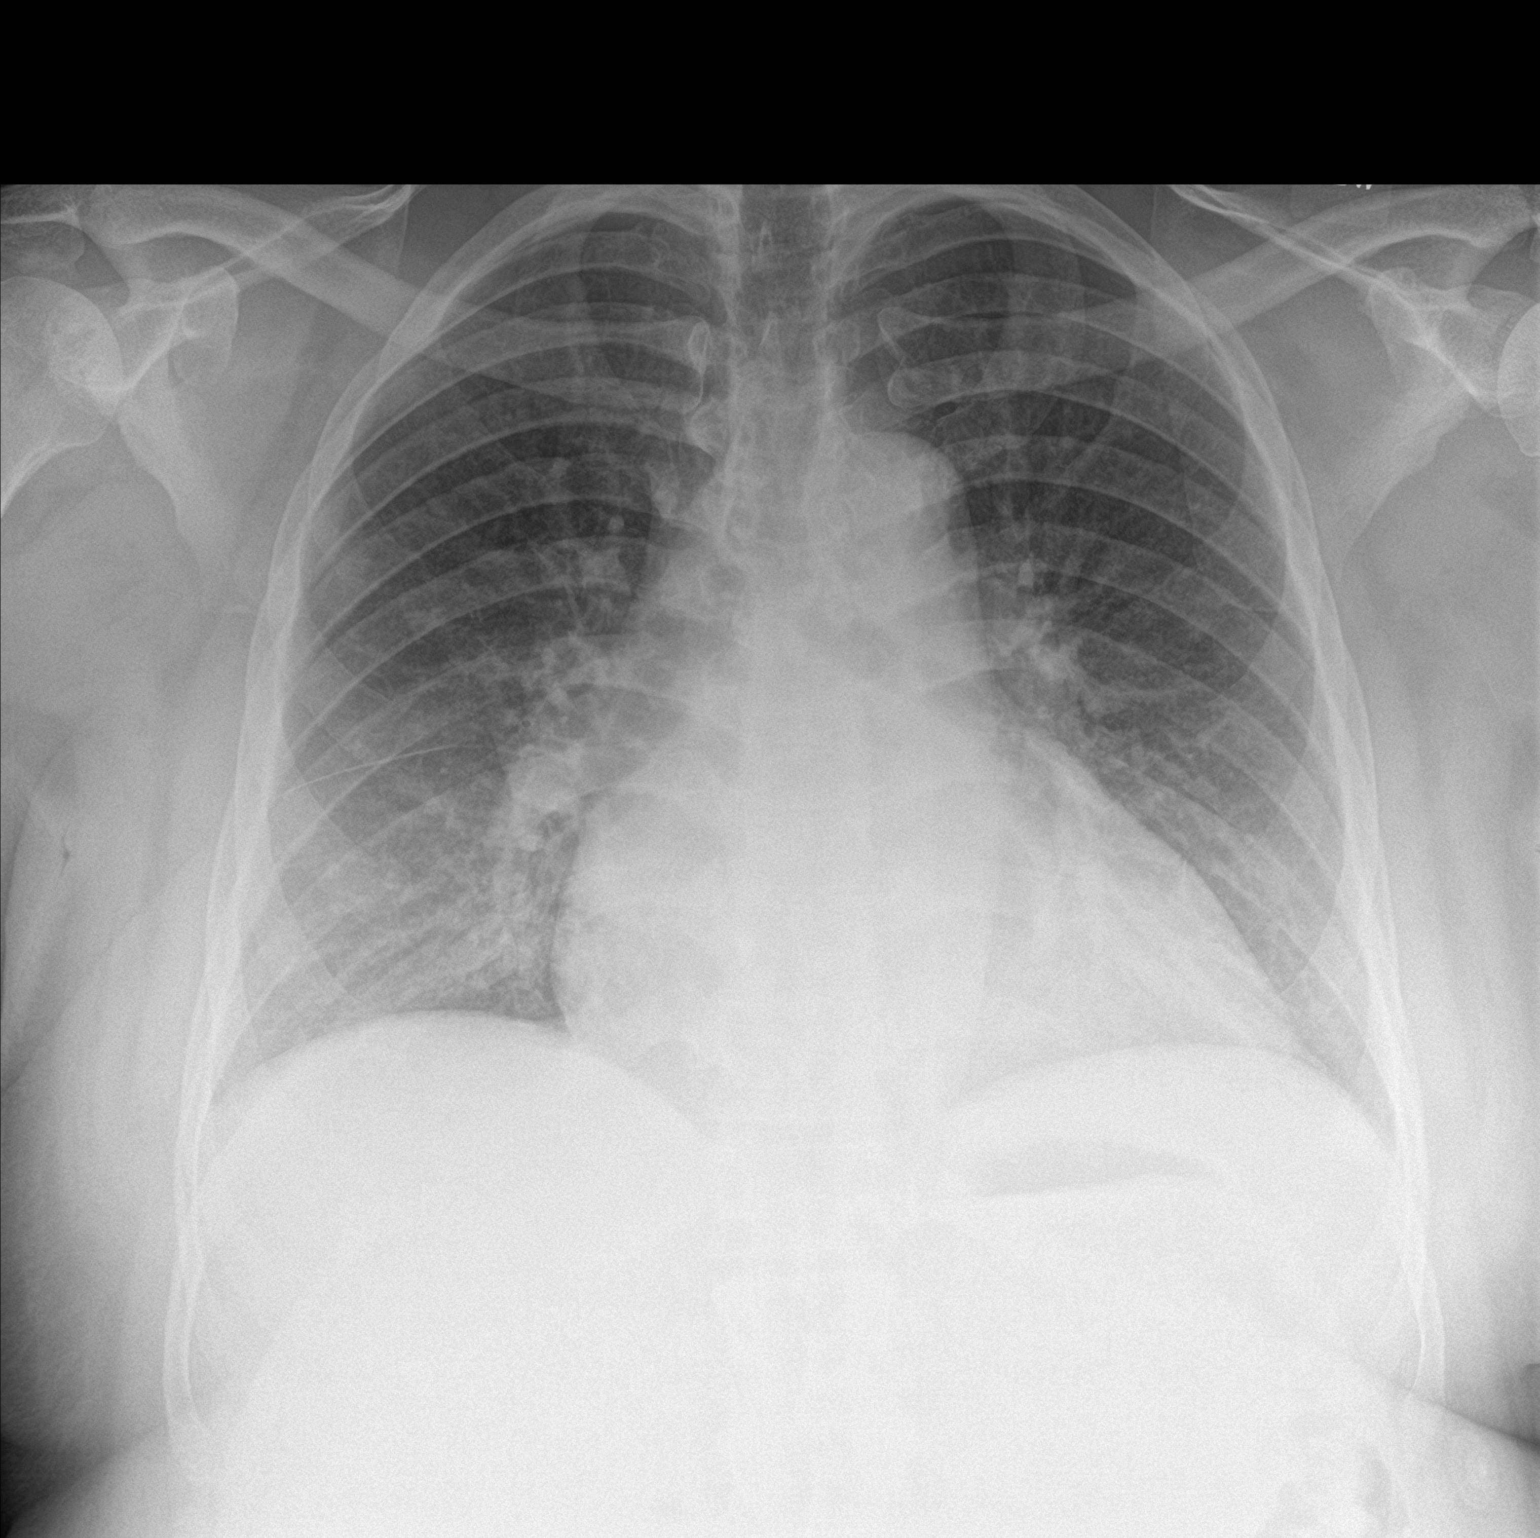

[chest lat]
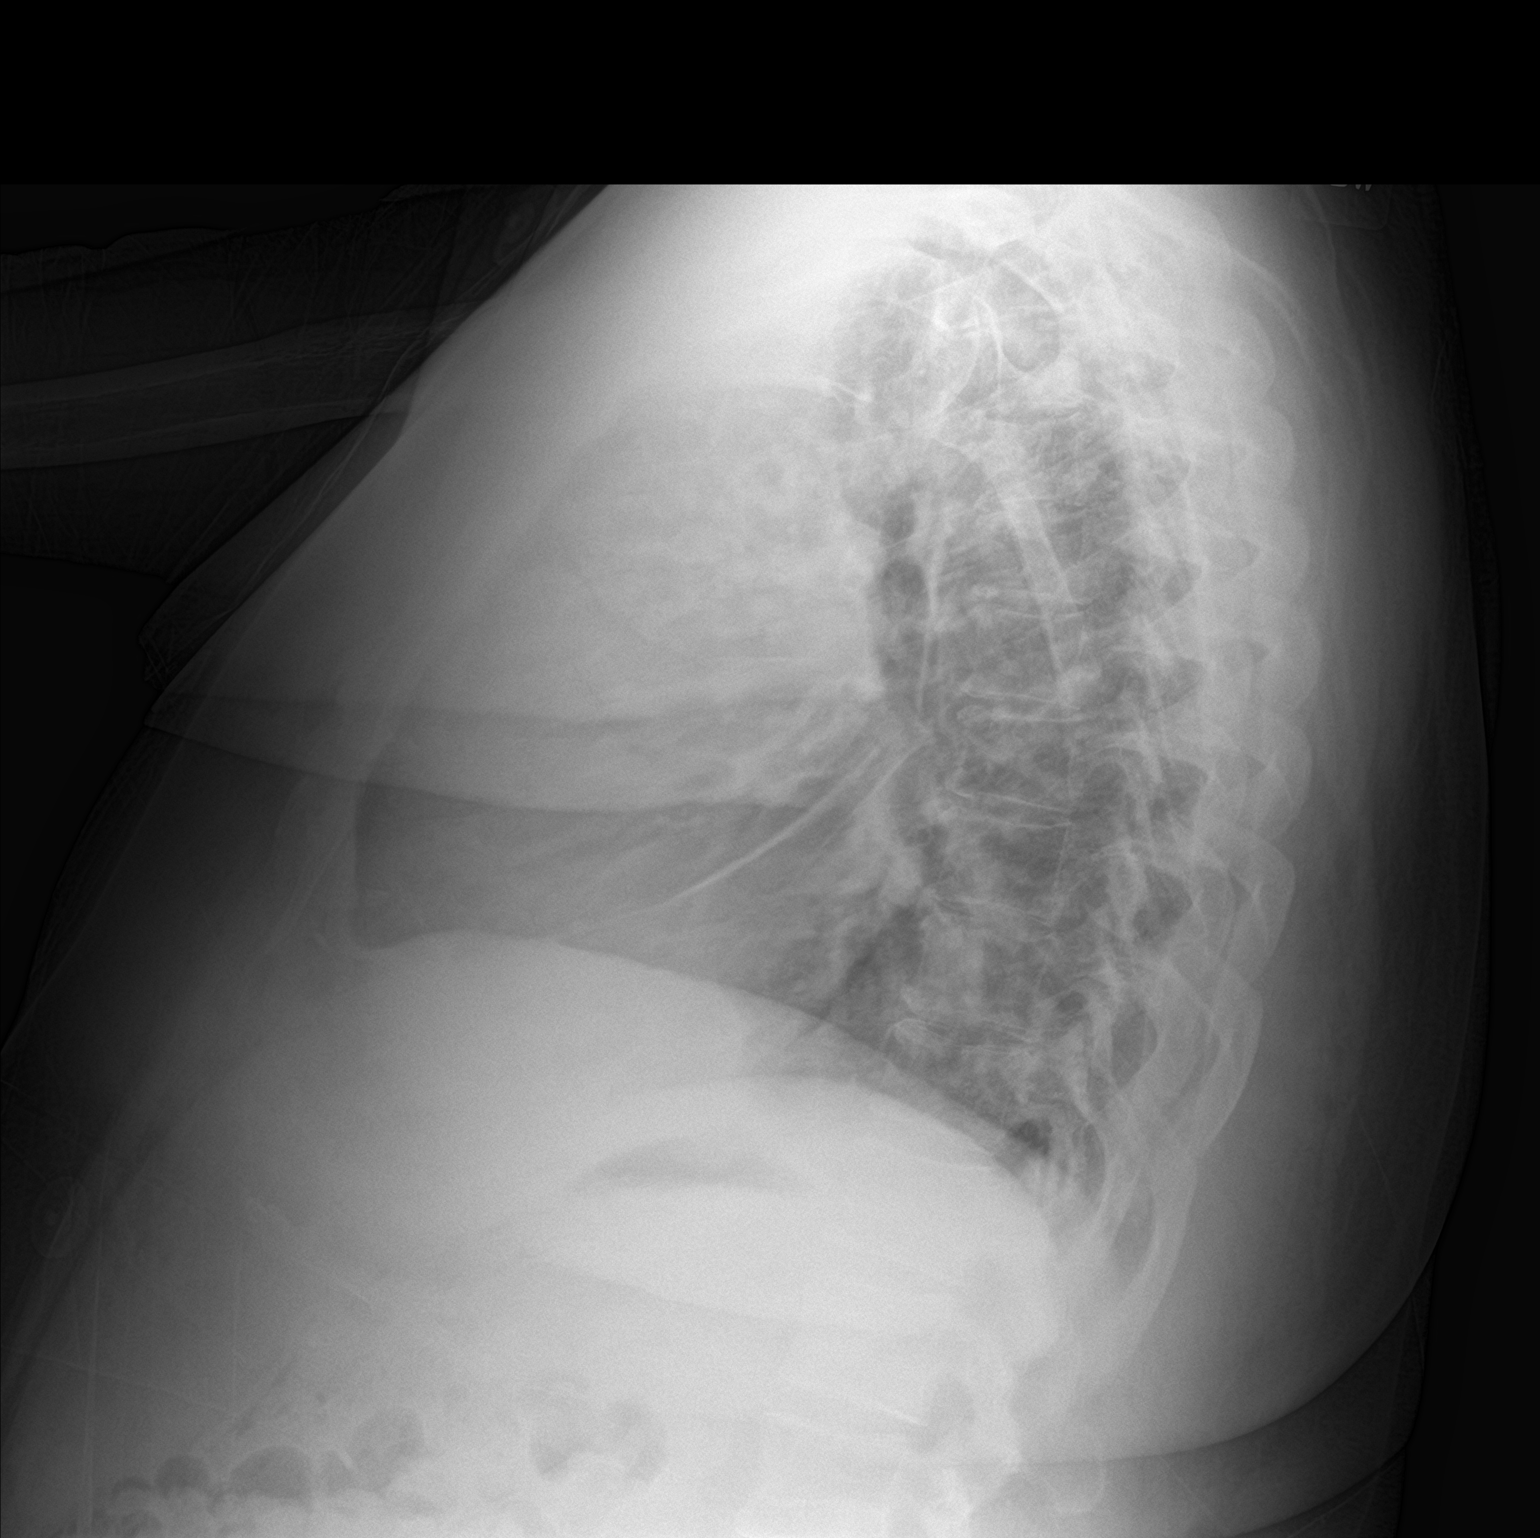

[2 of 2 positions shown; findings below may reference images not displayed]

FINDINGS: Stable cardiomegaly. The trachea is midline. Central vascular
structures are prominent for size but unchanged. Small amount of
fluid or thickening along the fissures. No large pleural effusions.
No focal airspace disease.
IMPRESSION: Stable cardiomegaly with vascular congestion. No overt pulmonary
edema. Findings are similar to the previous examination.

## 2017-12-03 IMAGING — DX DG CHEST 2V
2 series · 2 of 2 positions shown · non-contrast
Comparison: March 26, 2016

CLINICAL DATA: Swelling in hands and lower extremities. History of
CHF. Shortness of breath.

EXAM:
CHEST  2 VIEW

[chest pa]
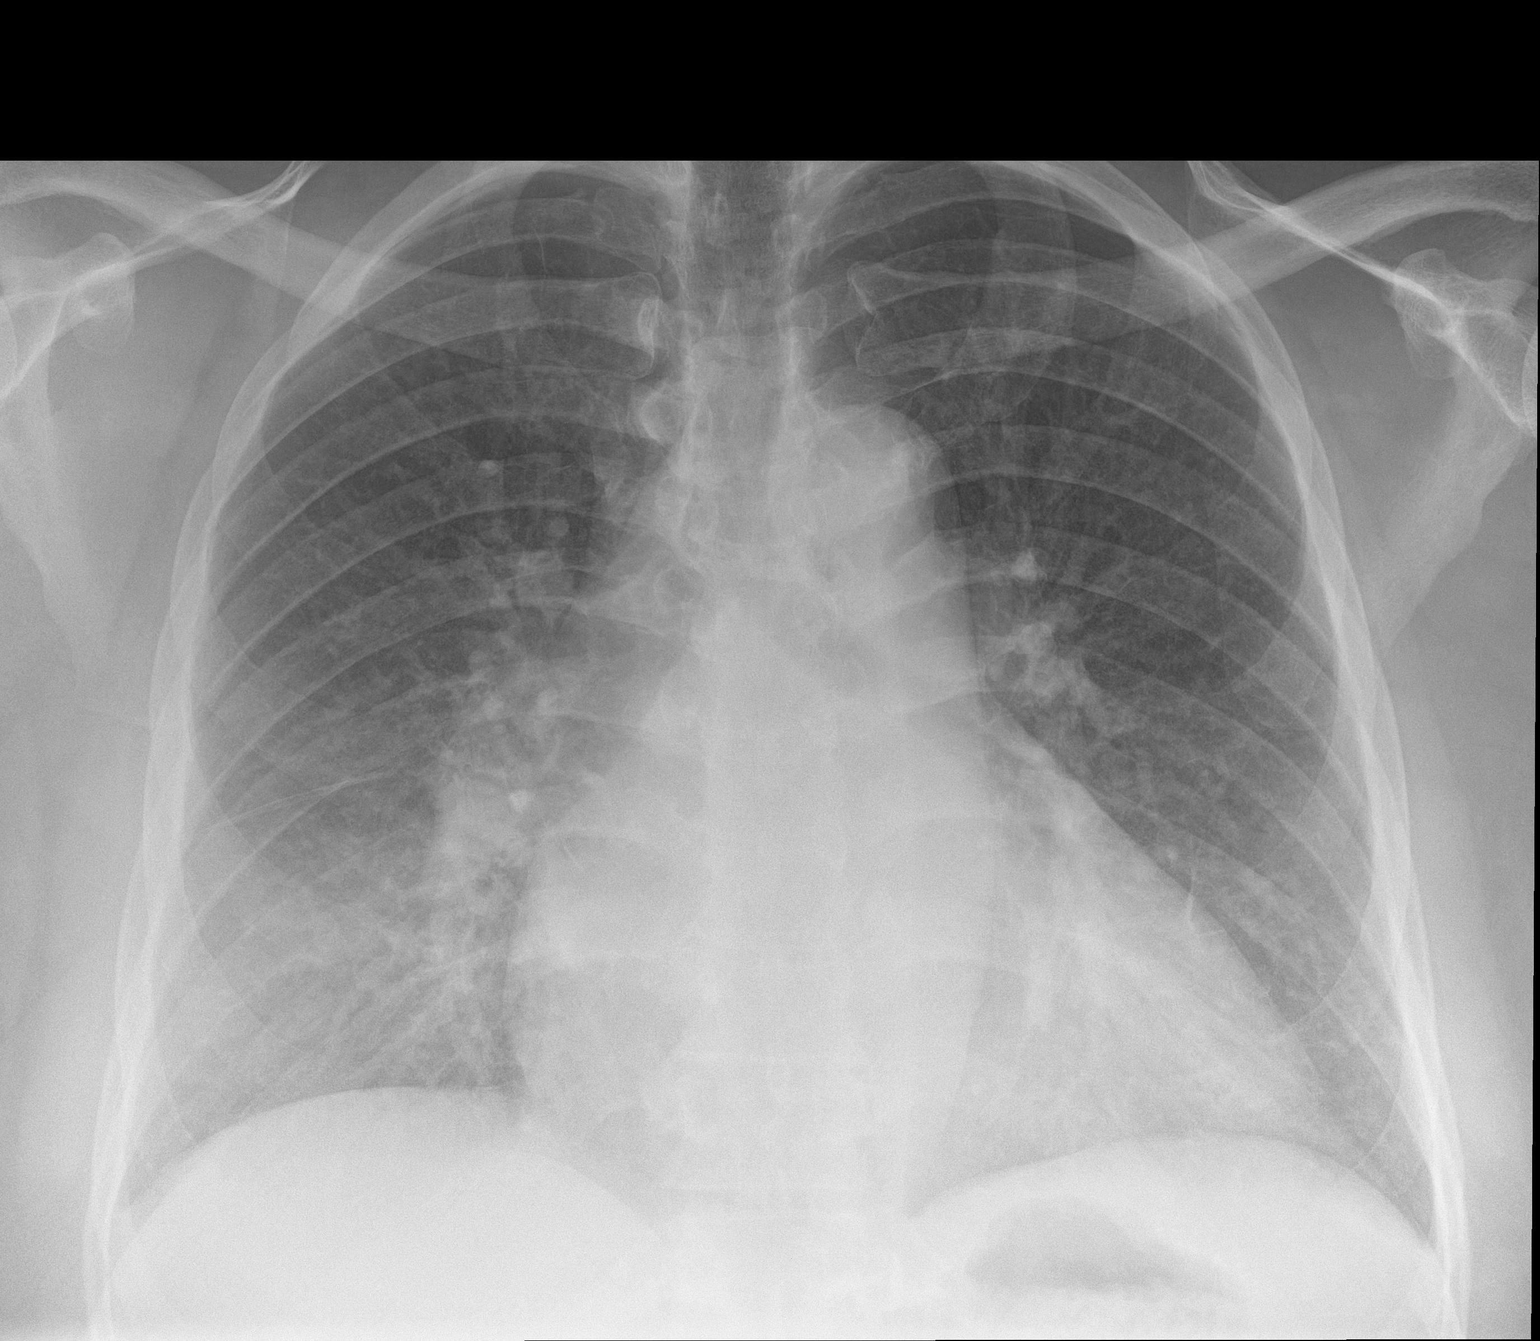

[chest lat]
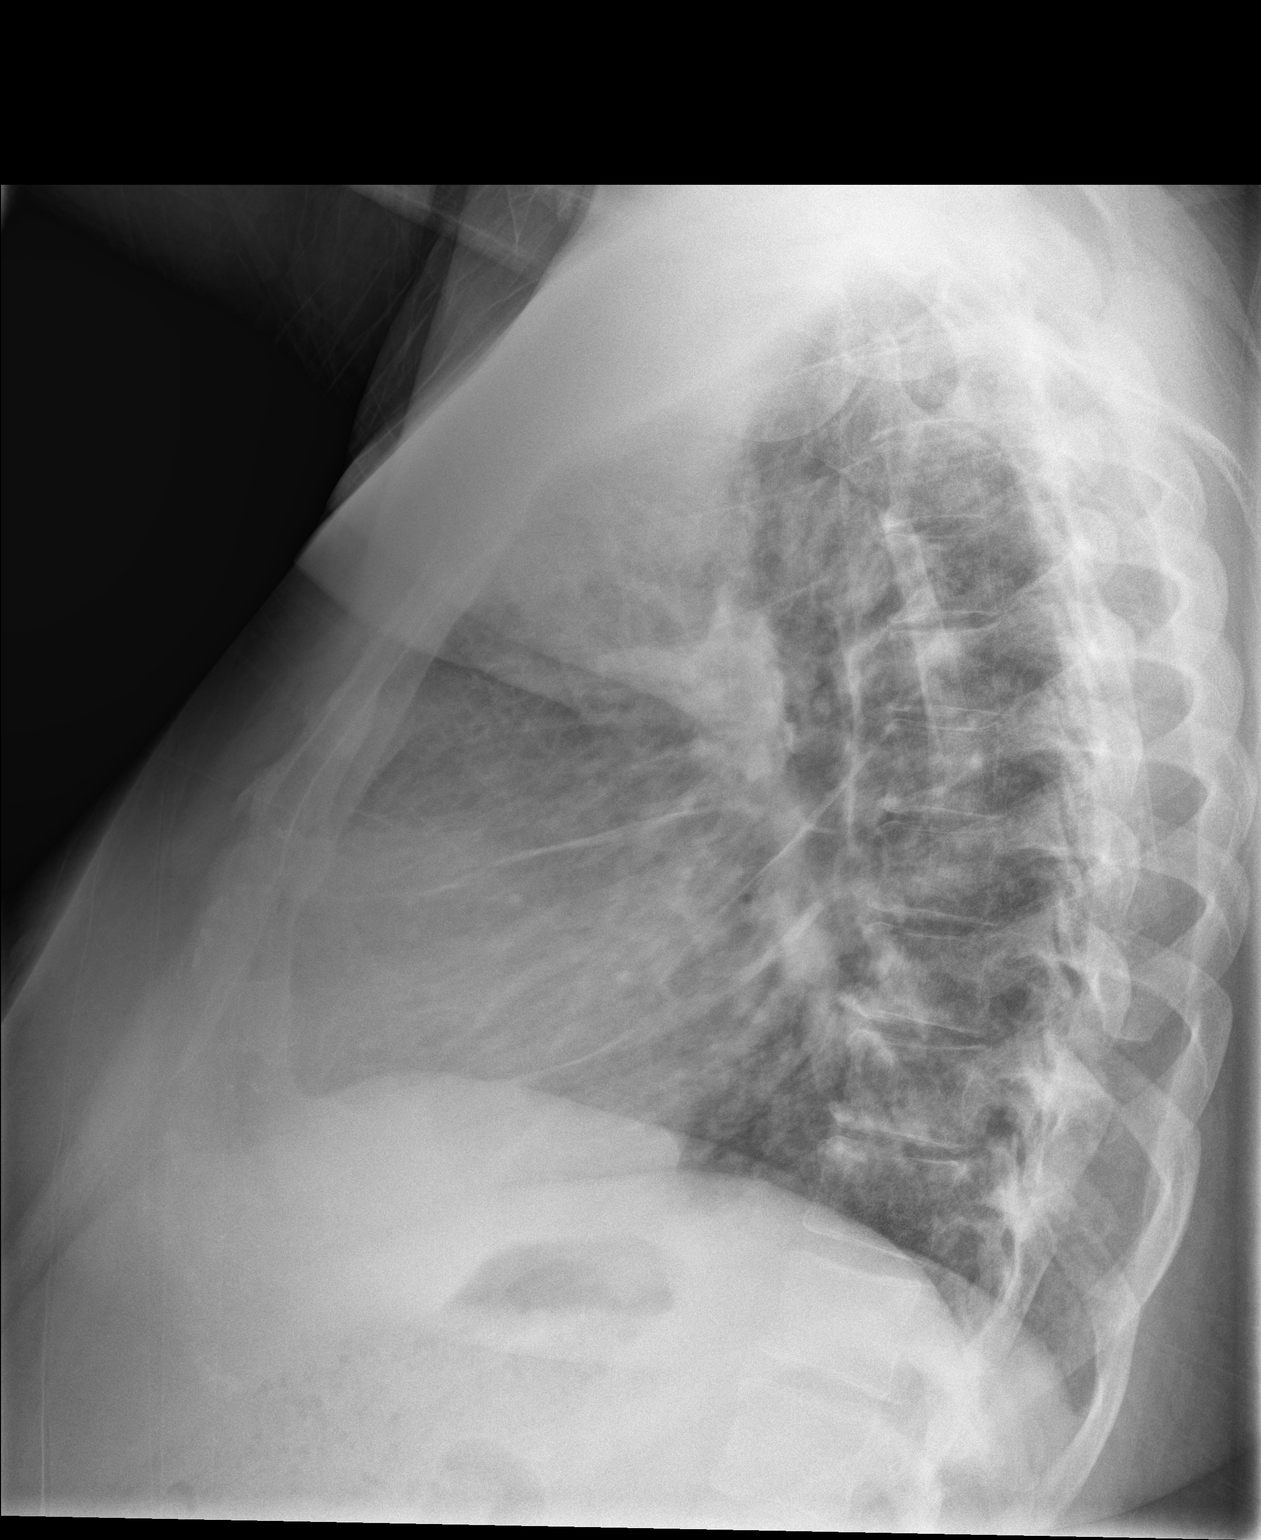

[2 of 2 positions shown; findings below may reference images not displayed]

FINDINGS: Stable cardiomegaly. The hila and mediastinum are normal. No
pneumothorax. No pulmonary nodules or masses. No focal infiltrates.
Mild edema.
IMPRESSION: Mild edema and cardiomegaly.

## 2017-12-05 IMAGING — DX DG CHEST 2V
2 series · 2 of 2 positions shown · non-contrast
Comparison: 03/28/2016

CLINICAL DATA: Short of breath

EXAM:
CHEST  2 VIEW

[chest pa]
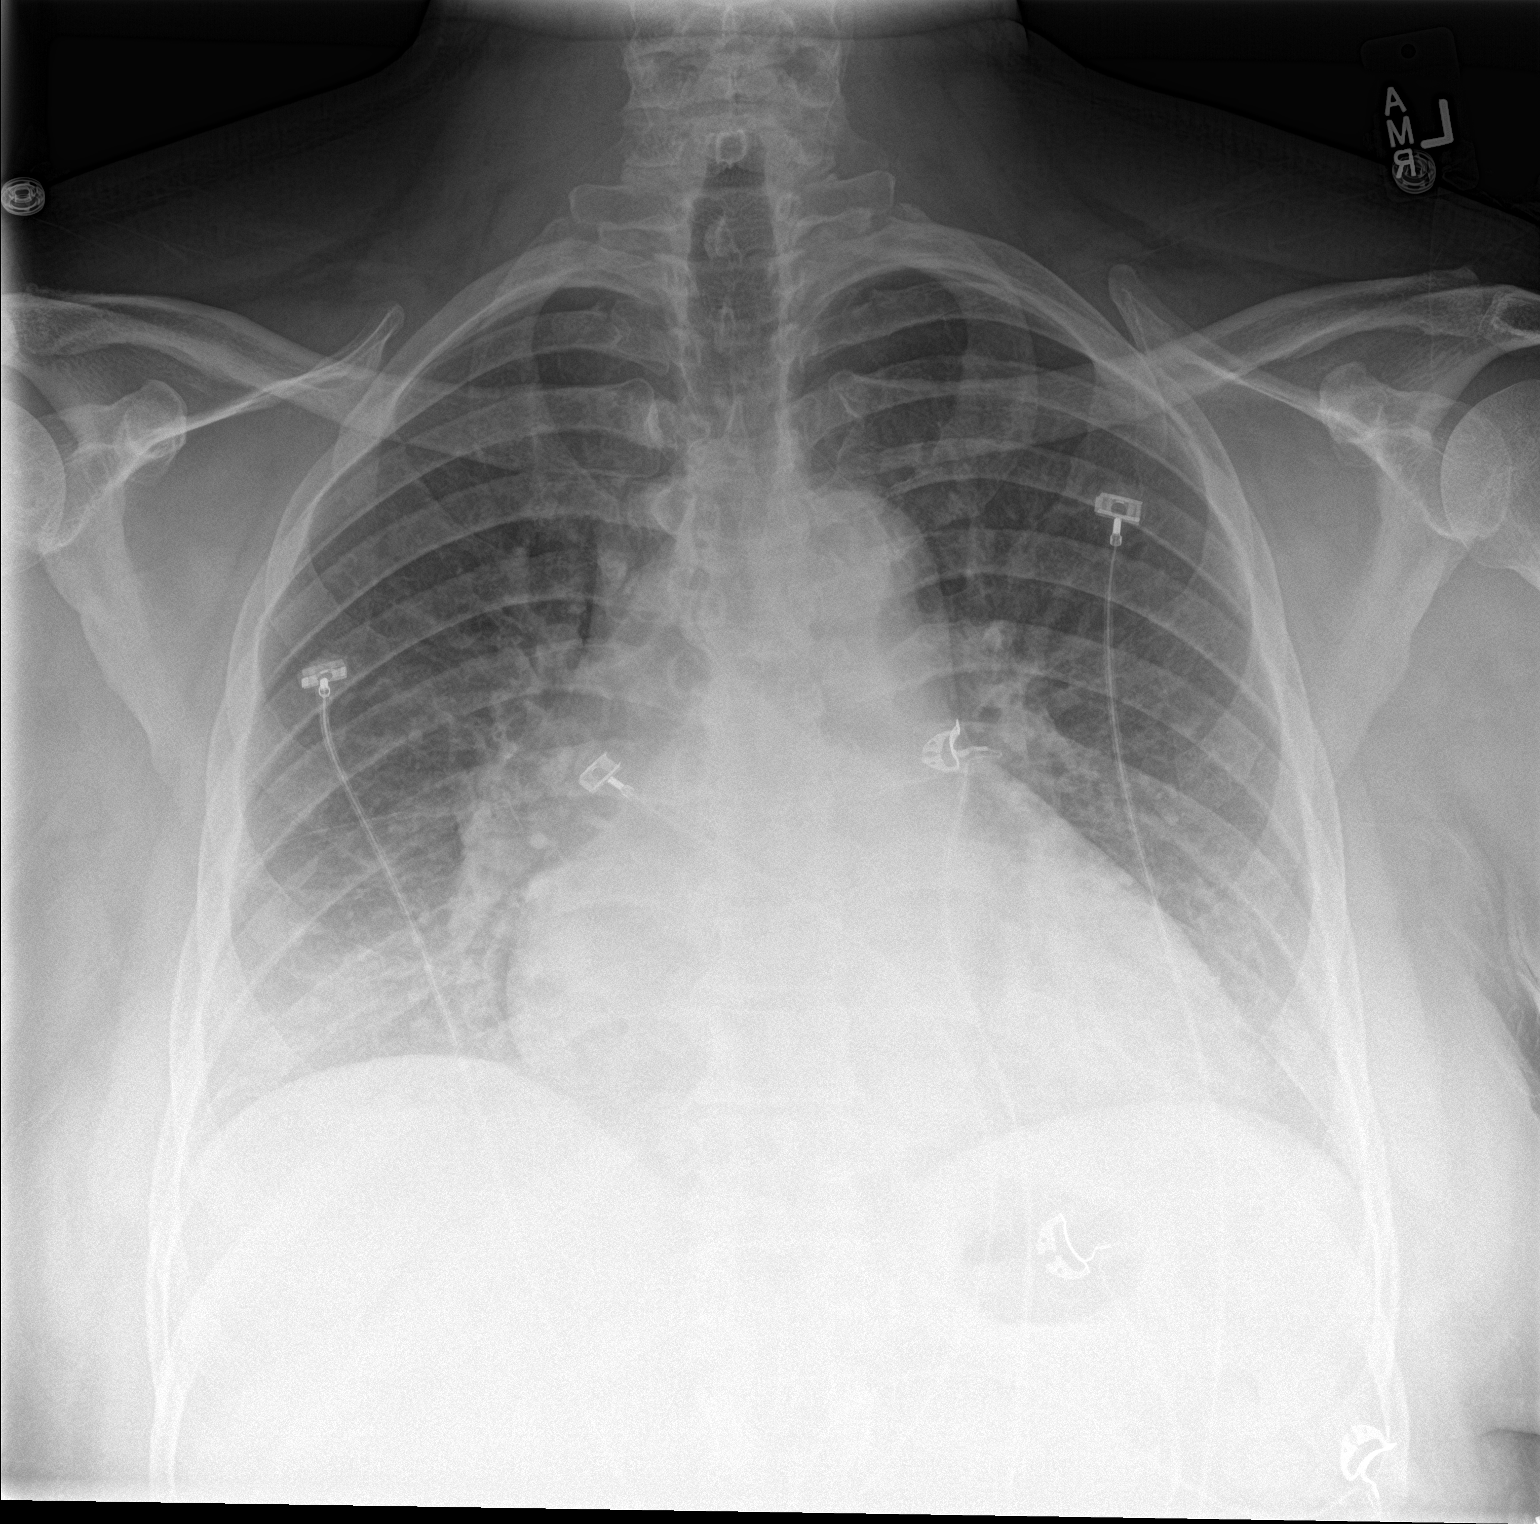

[chest lat]
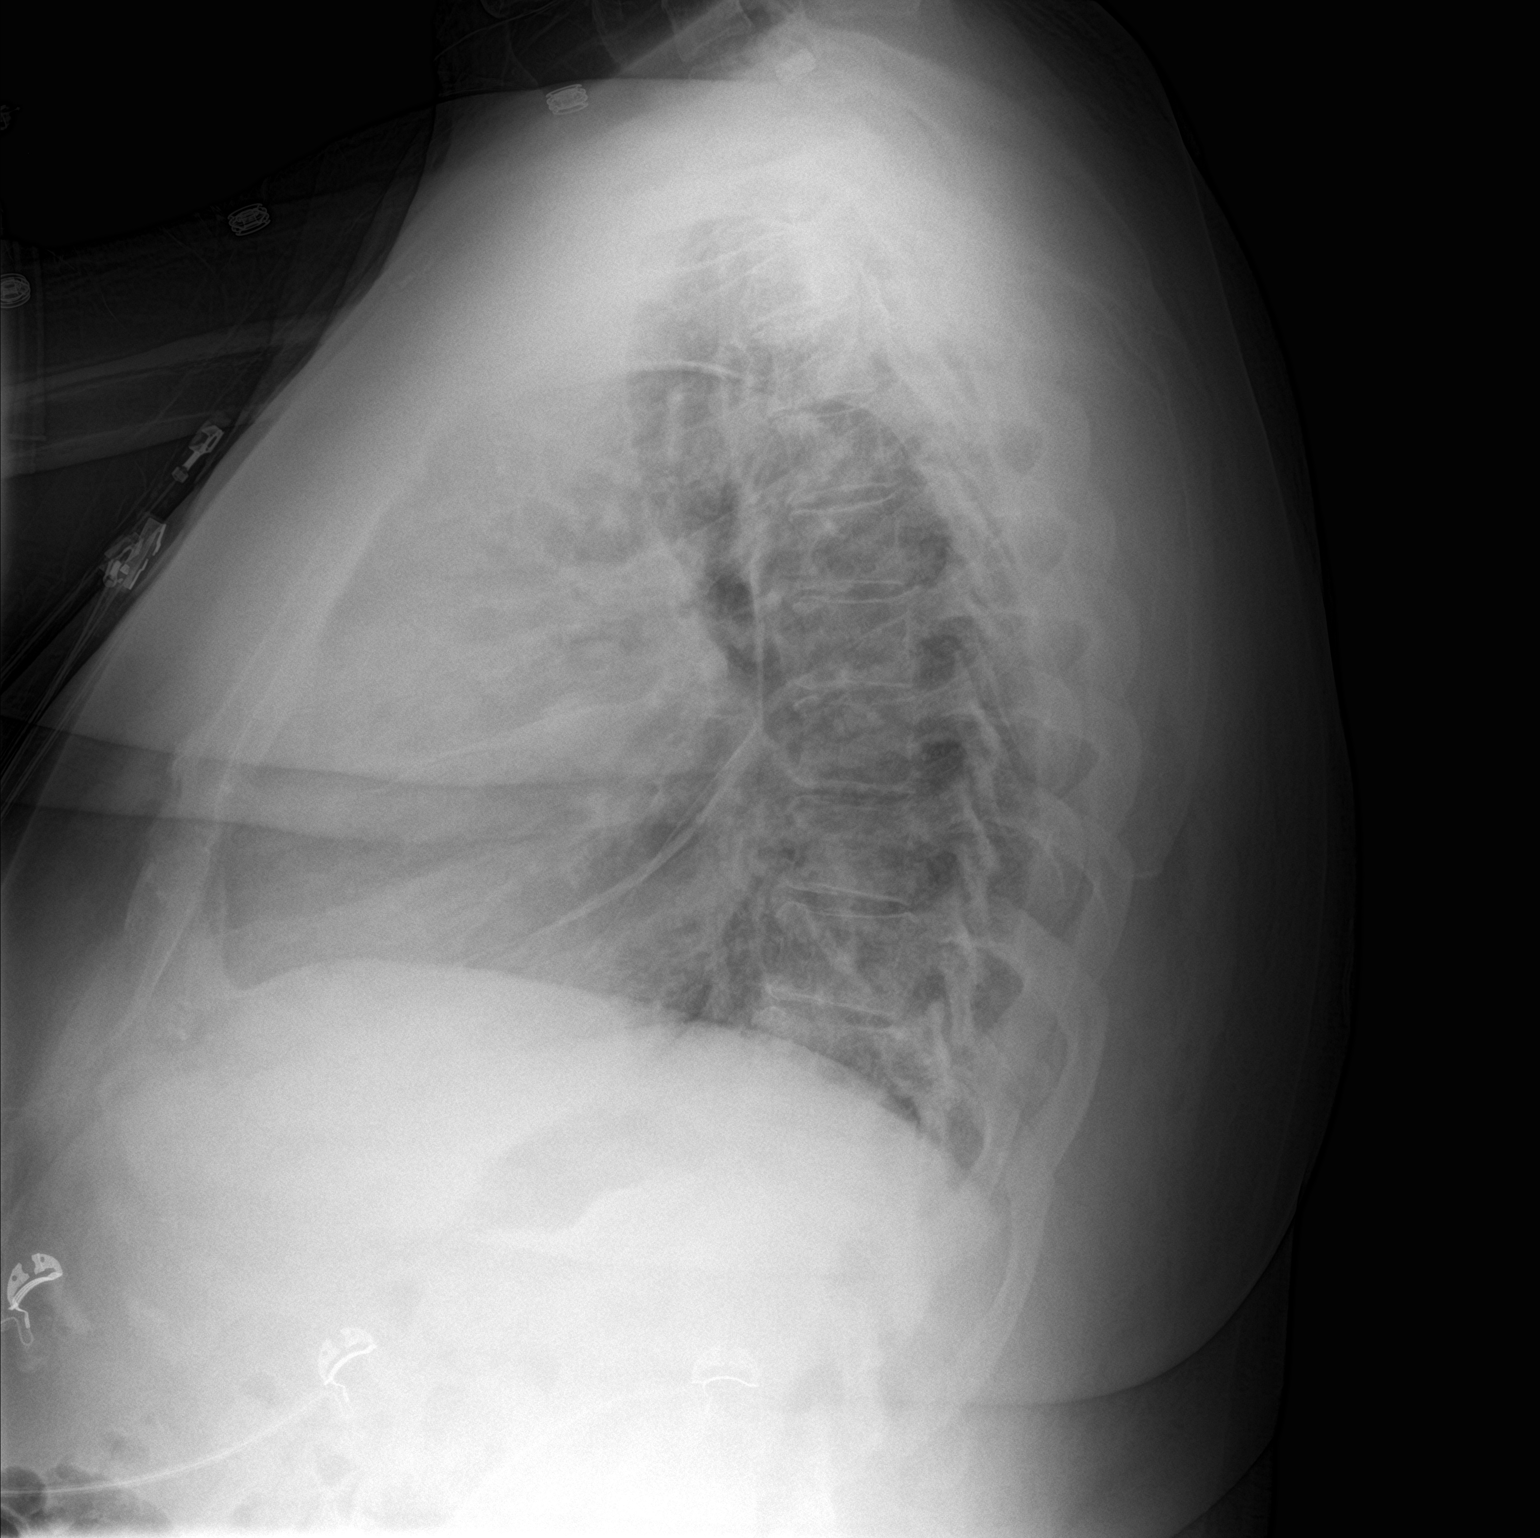

[2 of 2 positions shown; findings below may reference images not displayed]

FINDINGS: Cardiac enlargement. Pulmonary vascular congestion with interstitial
edema unchanged from the prior study. No pleural effusion.
IMPRESSION: Congestive heart failure with interstitial edema unchanged from 2
days ago.

## 2017-12-09 DIAGNOSIS — I5043 Acute on chronic combined systolic (congestive) and diastolic (congestive) heart failure: Secondary | ICD-10-CM | POA: Diagnosis not present

## 2017-12-10 IMAGING — CR DG CHEST 1V PORT
2 series · 2 of 2 positions shown · non-contrast
Comparison: April 02, 2016

CLINICAL DATA: Chest pain and shortness of breath for 2 days

EXAM:
PORTABLE CHEST 1 VIEW

[portable (1 of 2)]
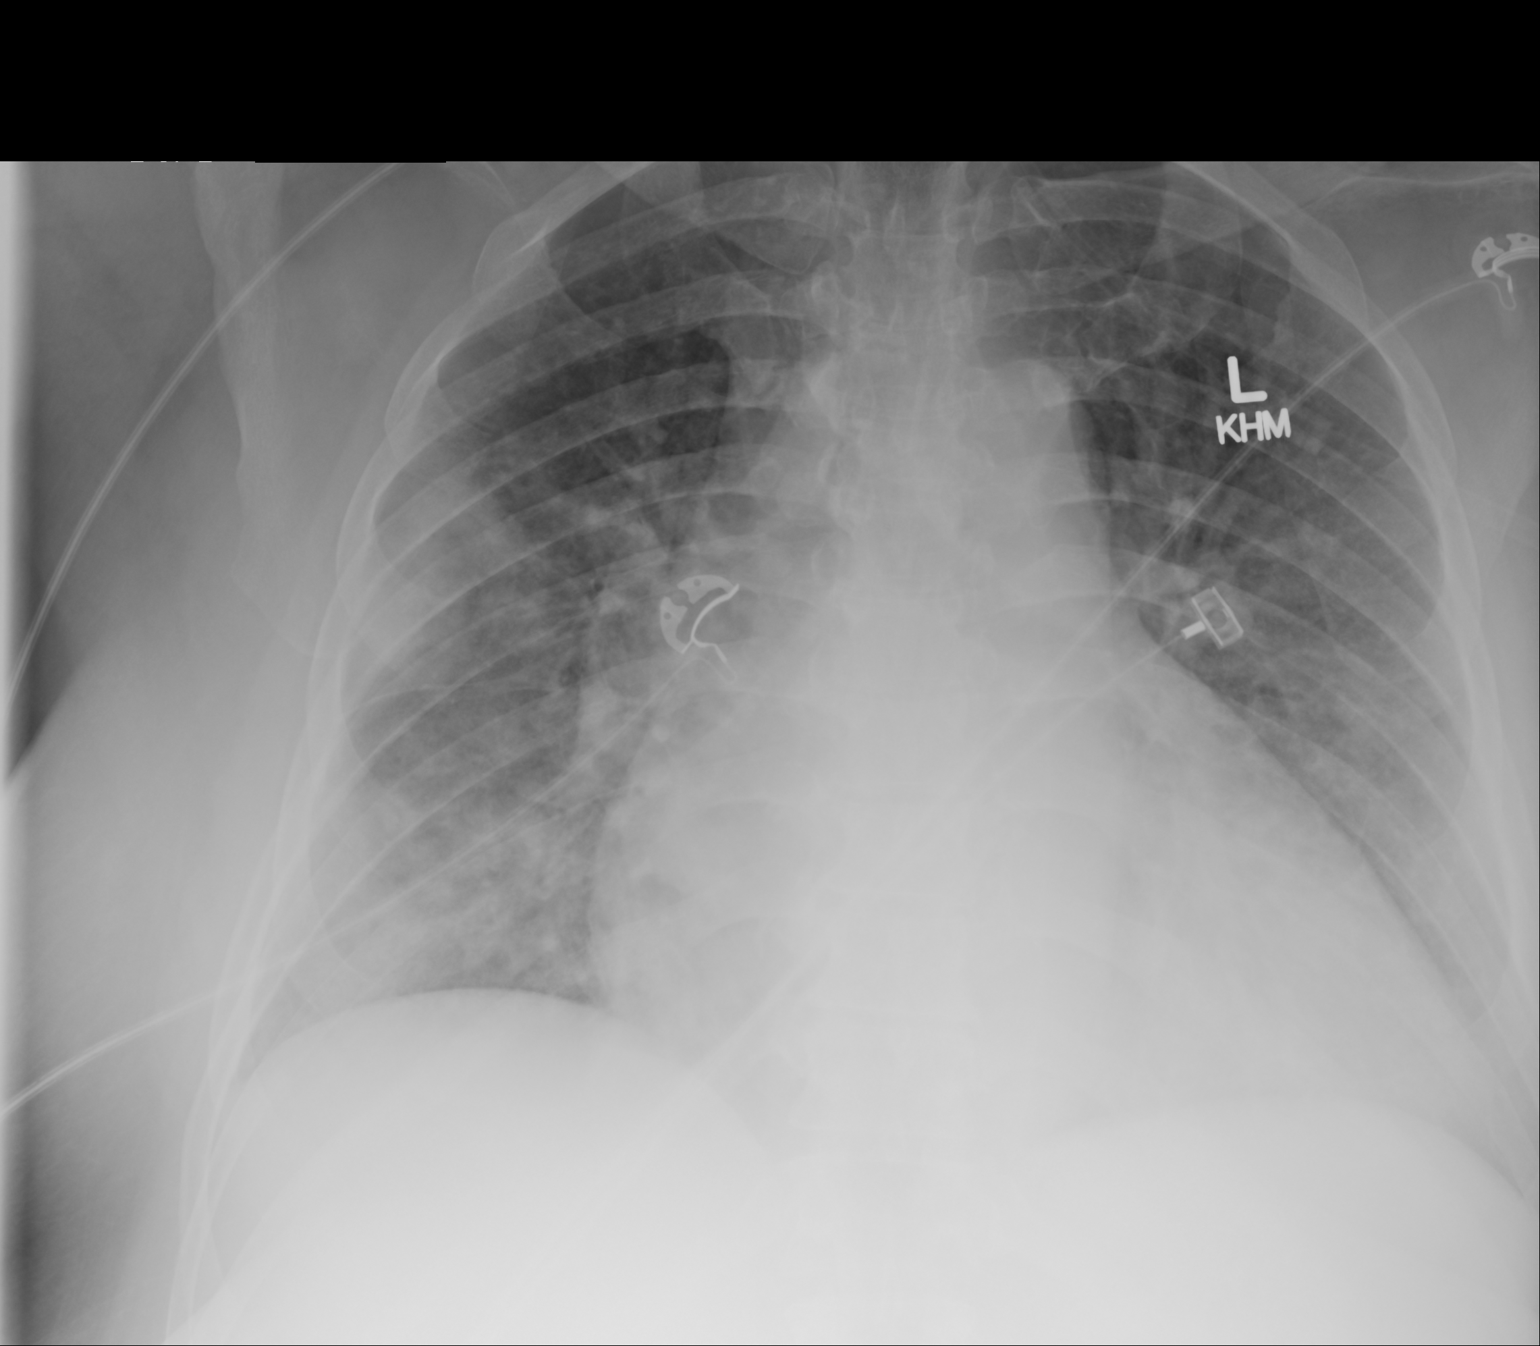

[portable (2 of 2)]
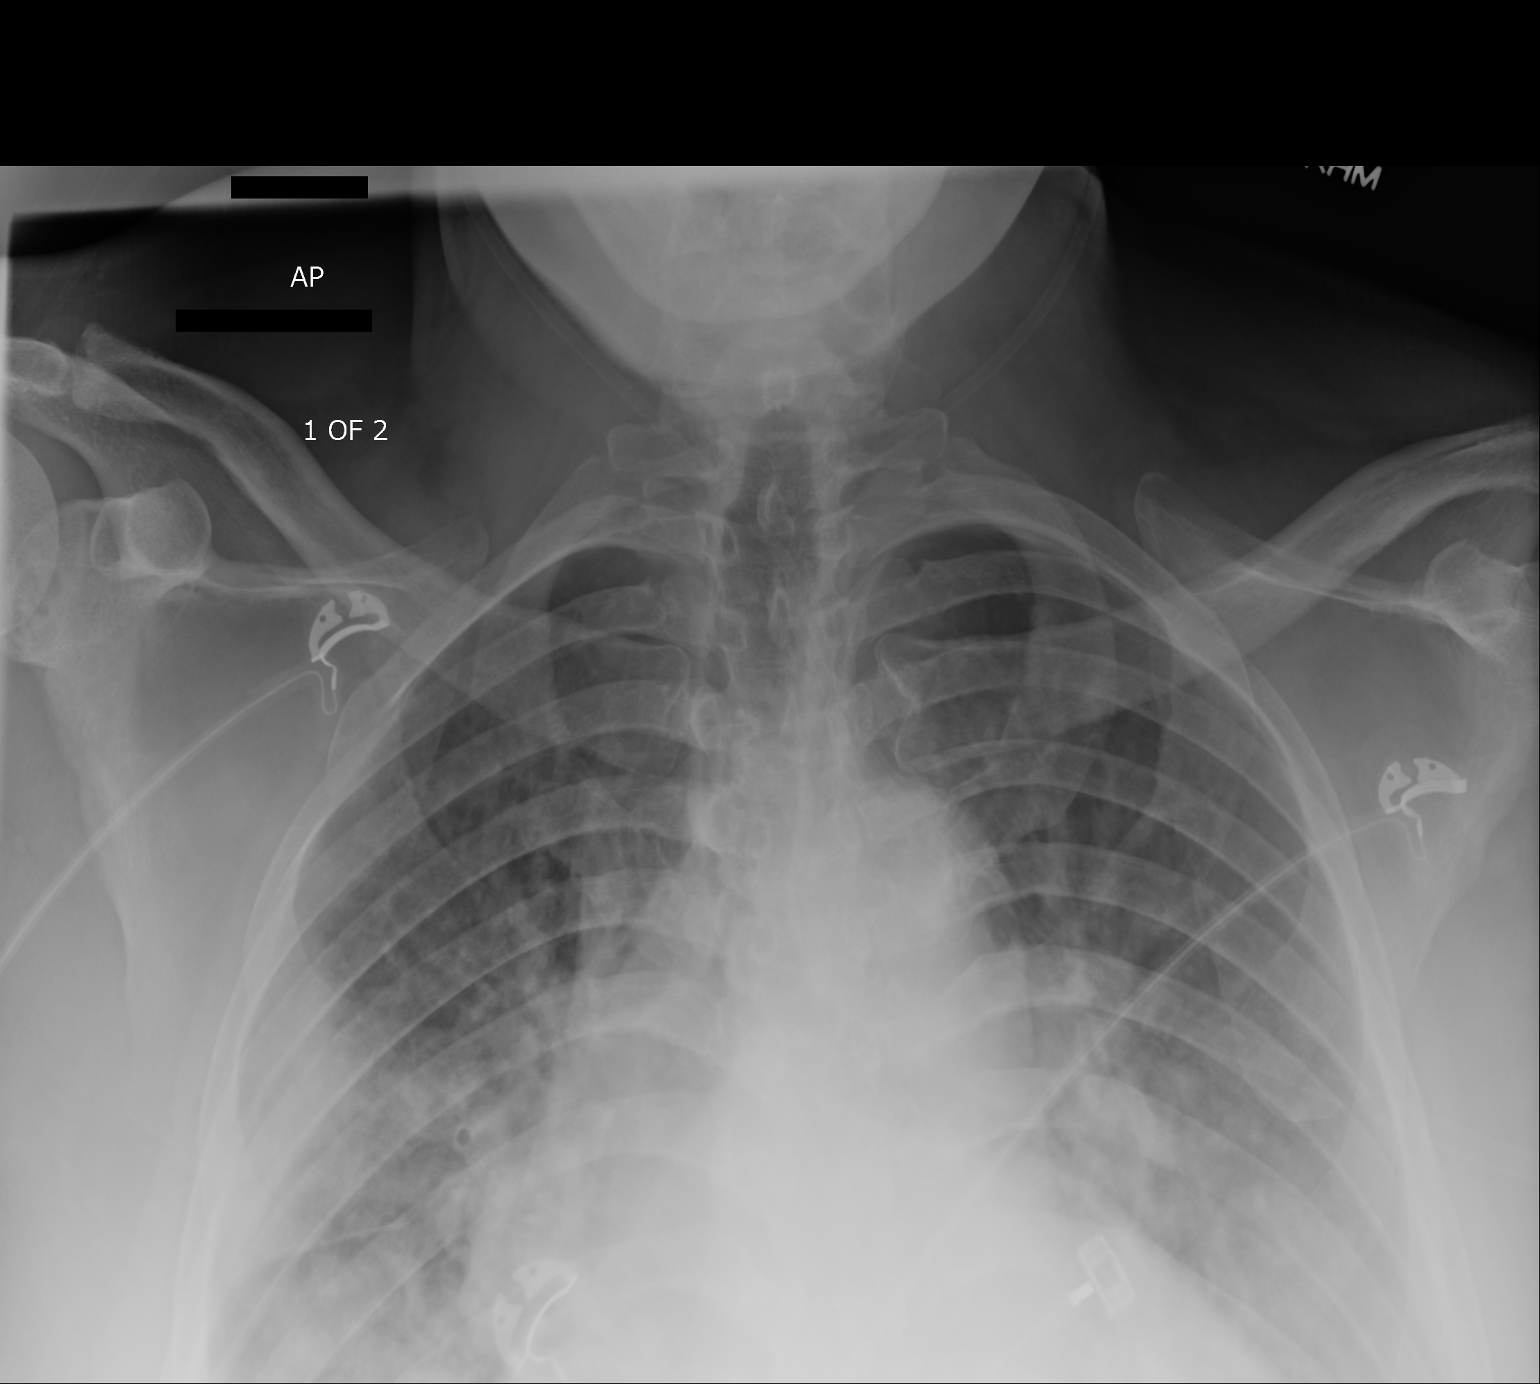

[2 of 2 positions shown; findings below may reference images not displayed]

FINDINGS: Bilateral pulmonary opacities are identified. The opacities are more
focal and prominent in the right mid and lower lung versus the left.
No pneumothorax. Cardiomegaly. The hila and mediastinum are
unremarkable. No other acute abnormalities
IMPRESSION: Cardiomegaly and mild edema. The more focal patchy opacity in the
right lung versus the left could represent asymmetric edema but is
at least moderately worrisome for a superimposed multi focal
pneumonia.

## 2017-12-15 ENCOUNTER — Encounter (HOSPITAL_COMMUNITY): Payer: Medicare Other | Admitting: Internal Medicine

## 2017-12-28 IMAGING — DX DG CHEST 2V
2 series · 2 of 2 positions shown · non-contrast
Comparison: 04/12/2016

CLINICAL DATA: Worsening shortness of breath for 3 days. Congestive
heart failure. Previous myocardial infarct. Nonischemic
cardiomyopathy.

EXAM:
CHEST  2 VIEW

[chest pa]
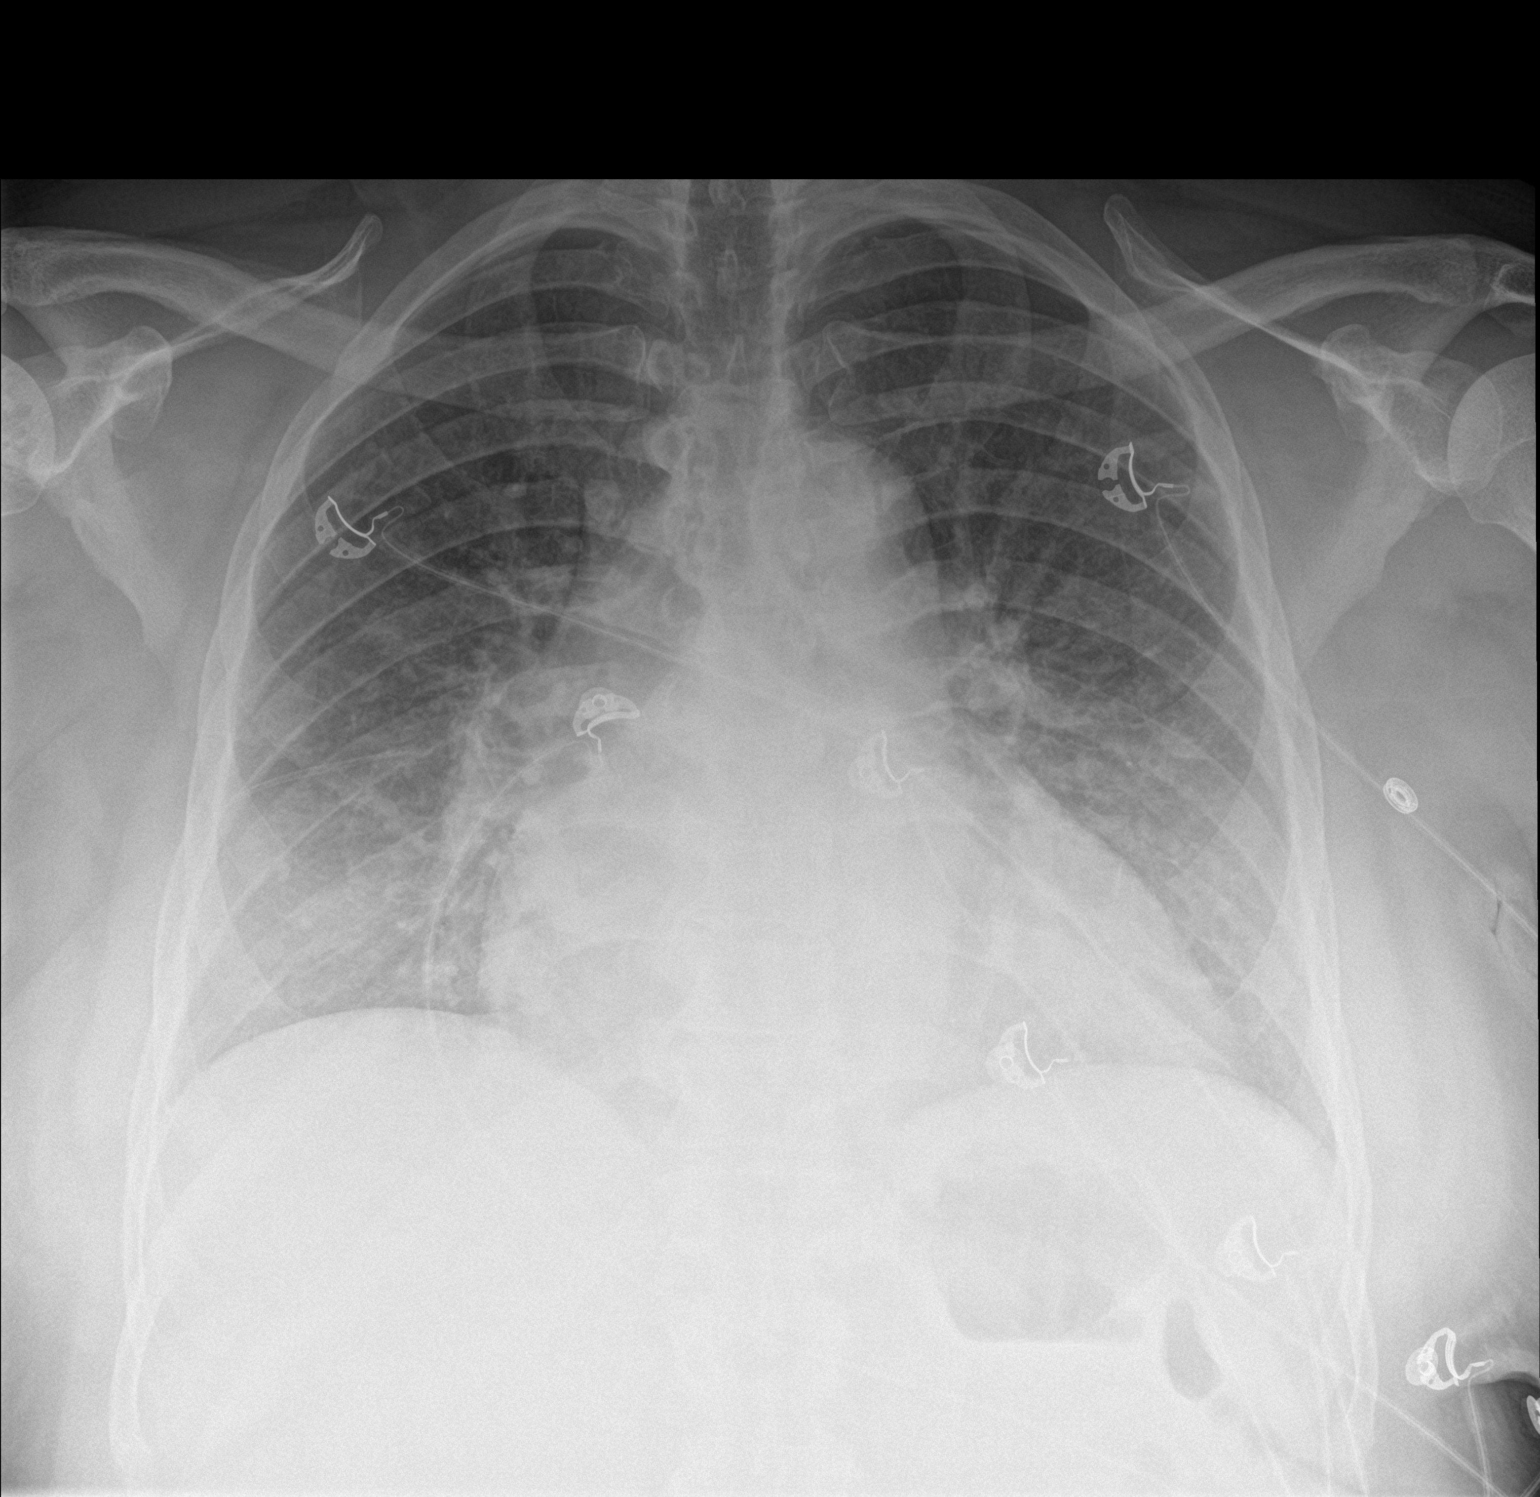

[chest lat]
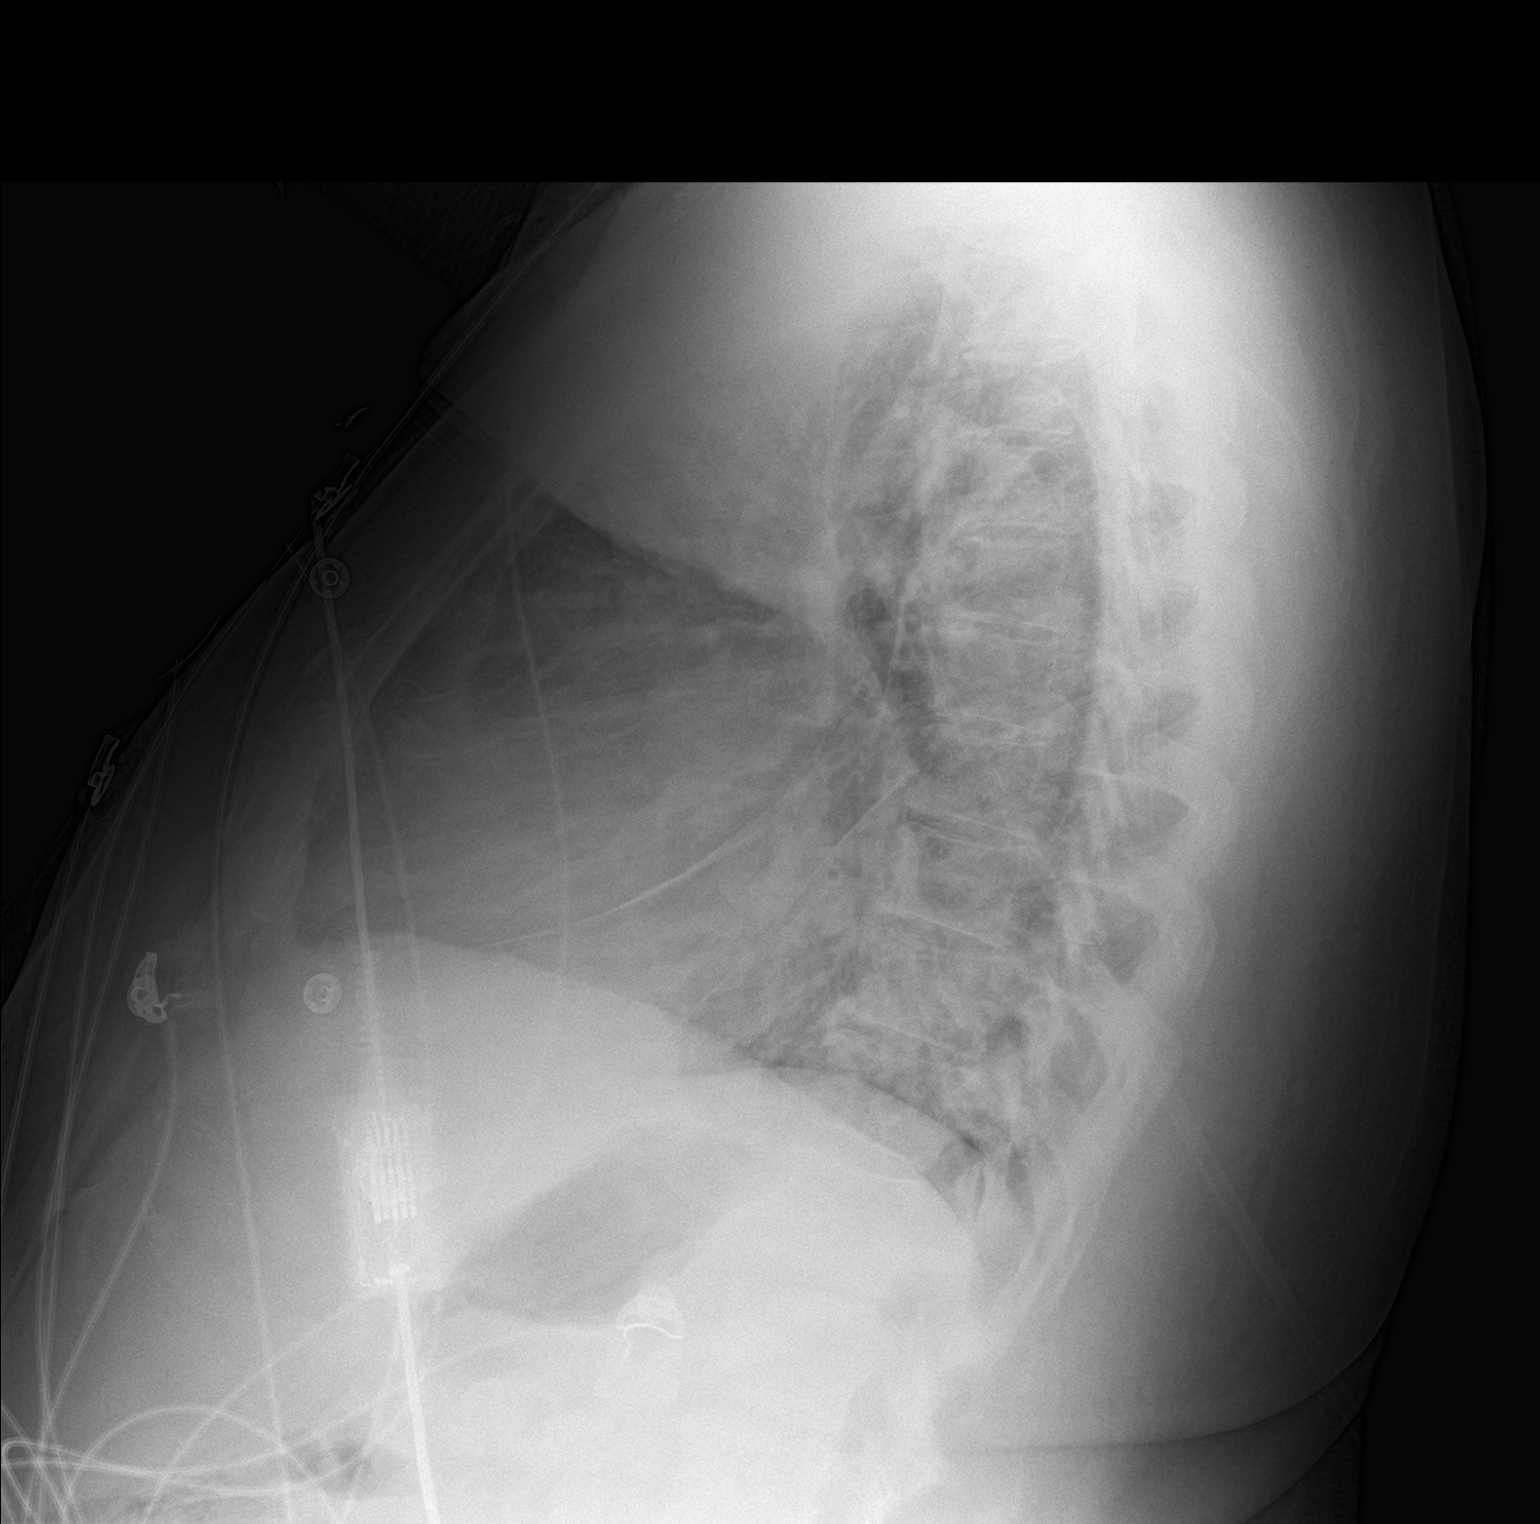

[2 of 2 positions shown; findings below may reference images not displayed]

FINDINGS: Mild cardiomegaly remains stable. Mild diffuse interstitial
infiltrates are seen, consistent with interstitial edema. This
appears decreased since previous study. No evidence of pulmonary
consolidation or pleural effusion.
IMPRESSION: Mild cardiomegaly and diffuse interstitial edema, consistent with
mild congestive heart failure. No evidence of pulmonary
consolidation or pleural effusion.

## 2018-01-06 IMAGING — DX DG CHEST 2V
2 series · 2 of 2 positions shown · non-contrast
Comparison: Chest radiograph 04/26/2016.

CLINICAL DATA: Patient with shortness of breath.

EXAM:
CHEST  2 VIEW

[chest ap]
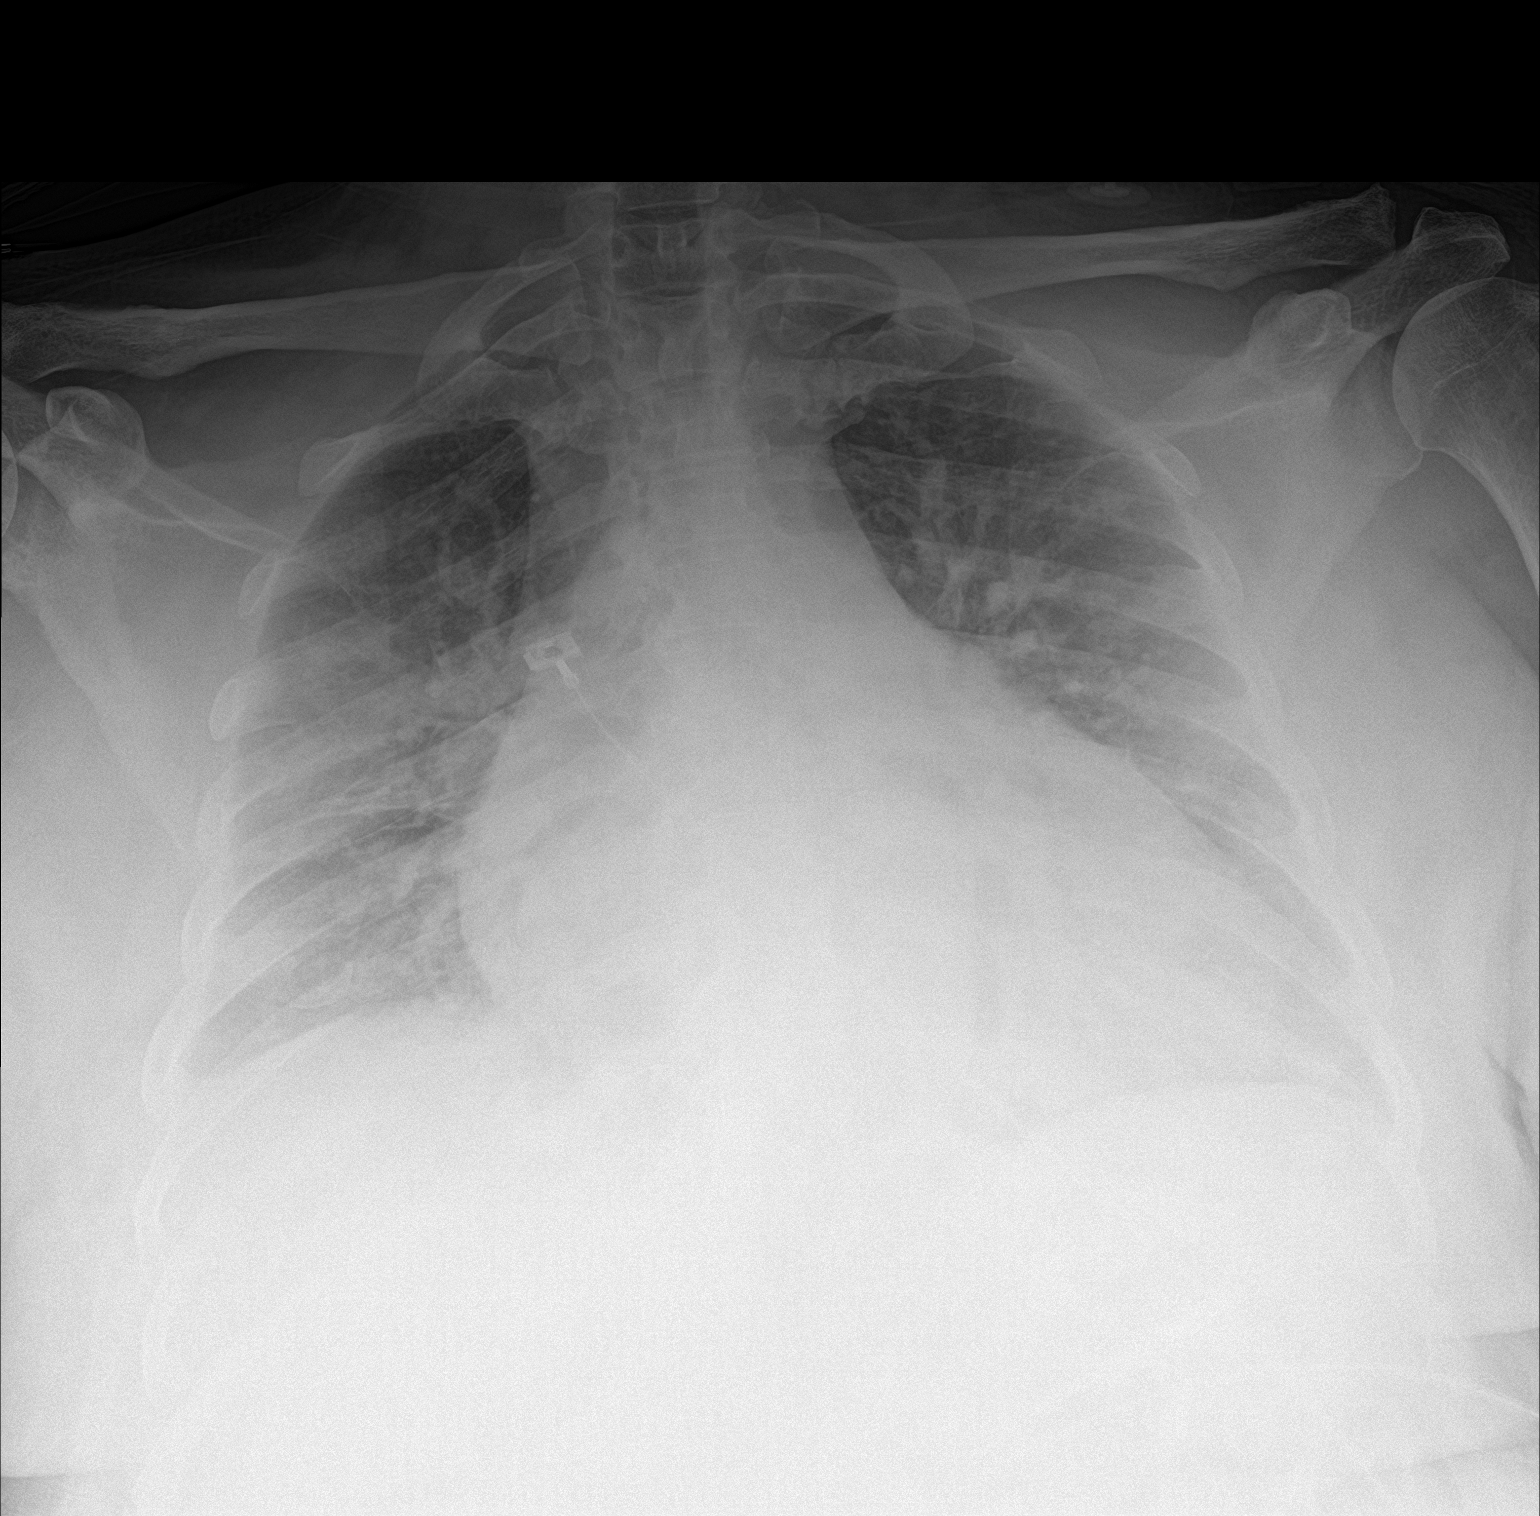

[chest lat]
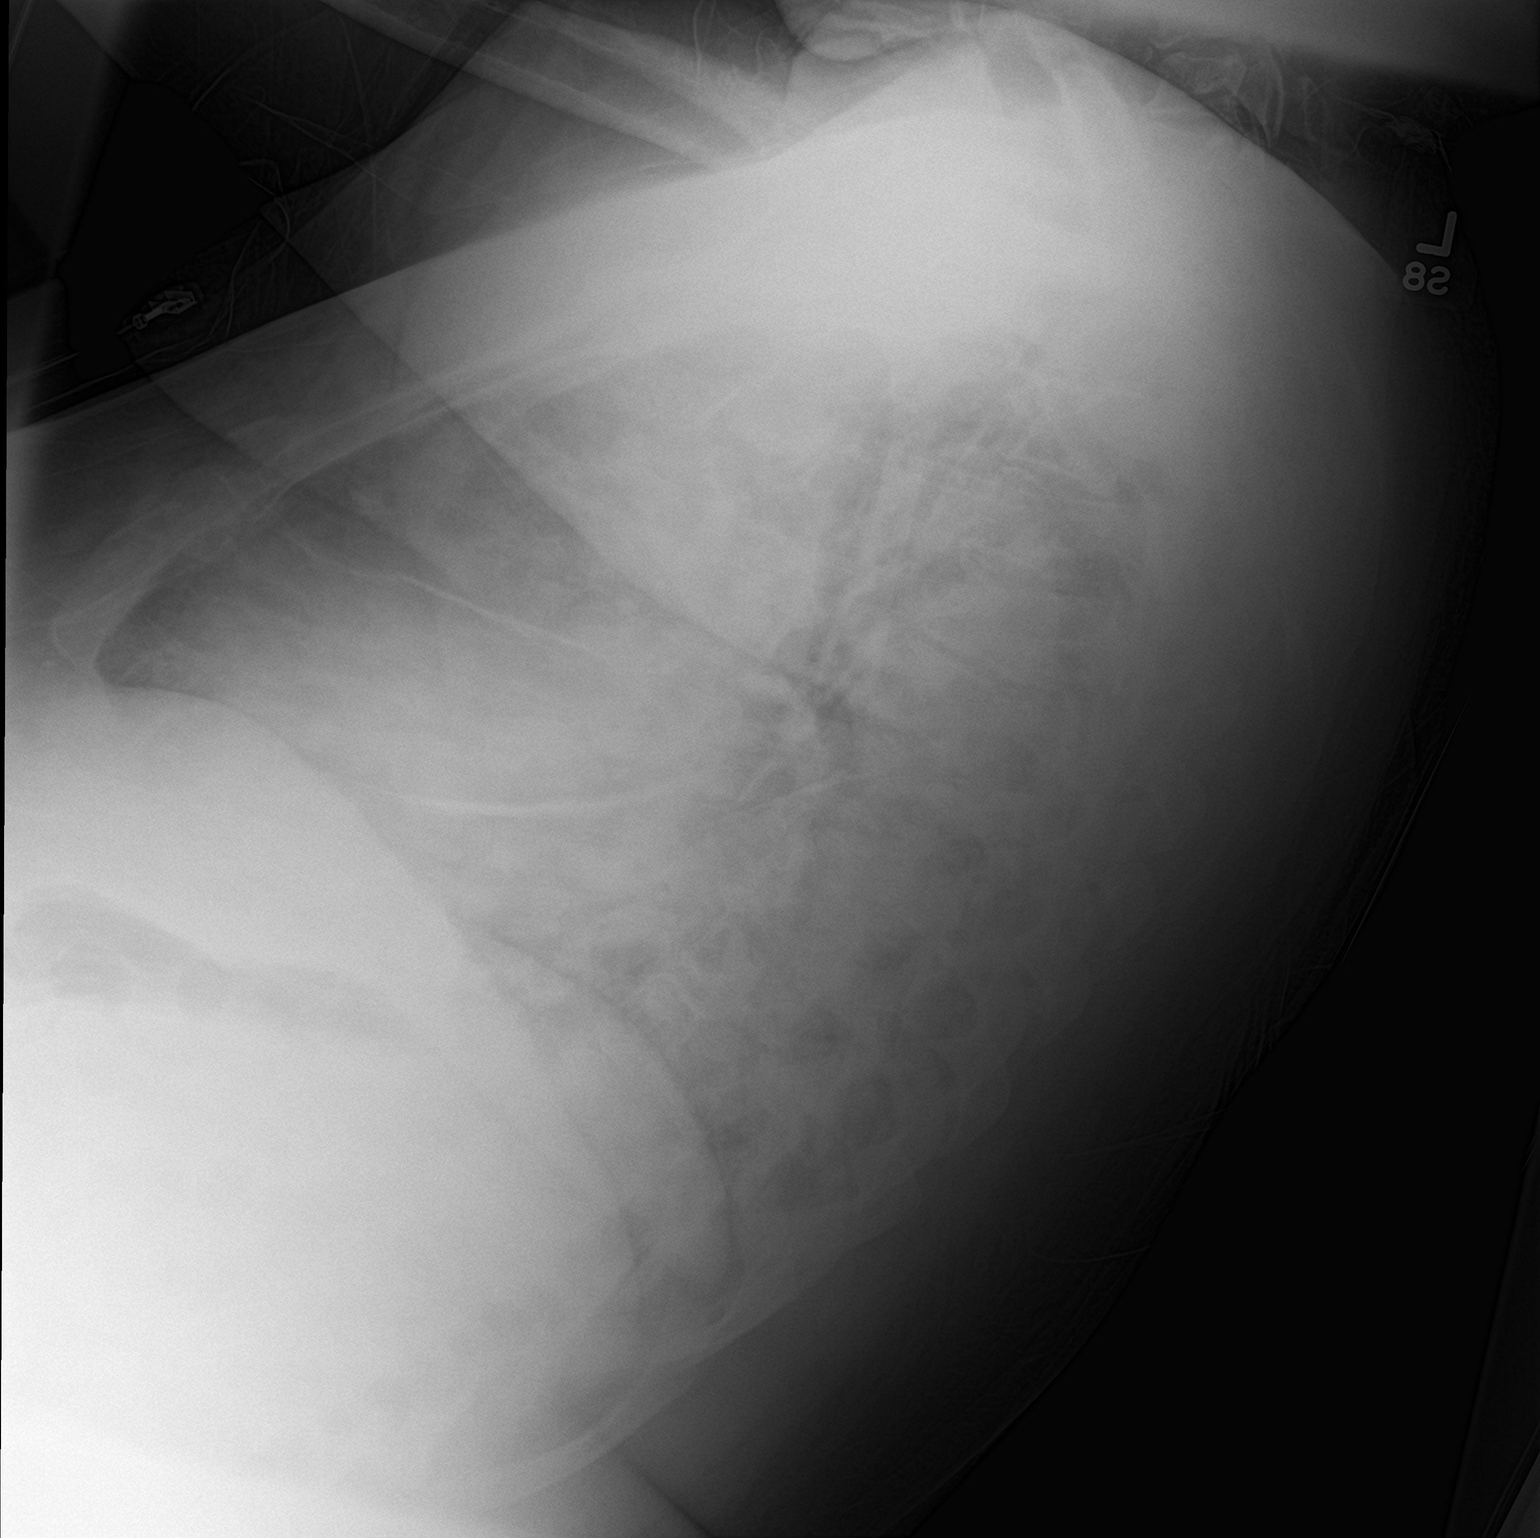

[2 of 2 positions shown; findings below may reference images not displayed]

FINDINGS: Monitoring leads overlie the patient. Marked cardiomegaly. Pulmonary
vascular redistribution and mild interstitial pulmonary edema. No
definite pleural effusion. Thoracic spine degenerative changes.
IMPRESSION: Marked cardiomegaly with pulmonary vascular redistribution and mild
interstitial edema.

## 2018-01-08 DIAGNOSIS — I5043 Acute on chronic combined systolic (congestive) and diastolic (congestive) heart failure: Secondary | ICD-10-CM | POA: Diagnosis not present

## 2018-01-15 DIAGNOSIS — I480 Paroxysmal atrial fibrillation: Secondary | ICD-10-CM | POA: Diagnosis not present

## 2018-01-15 DIAGNOSIS — I10 Essential (primary) hypertension: Secondary | ICD-10-CM | POA: Diagnosis not present

## 2018-01-15 DIAGNOSIS — I509 Heart failure, unspecified: Secondary | ICD-10-CM | POA: Diagnosis not present

## 2018-01-15 DIAGNOSIS — I482 Chronic atrial fibrillation: Secondary | ICD-10-CM | POA: Diagnosis not present

## 2018-01-15 DIAGNOSIS — E785 Hyperlipidemia, unspecified: Secondary | ICD-10-CM | POA: Diagnosis not present

## 2018-01-21 DIAGNOSIS — I110 Hypertensive heart disease with heart failure: Secondary | ICD-10-CM | POA: Diagnosis not present

## 2018-01-21 DIAGNOSIS — E876 Hypokalemia: Secondary | ICD-10-CM | POA: Diagnosis not present

## 2018-01-21 DIAGNOSIS — I10 Essential (primary) hypertension: Secondary | ICD-10-CM | POA: Diagnosis not present

## 2018-01-21 DIAGNOSIS — I480 Paroxysmal atrial fibrillation: Secondary | ICD-10-CM | POA: Diagnosis not present

## 2018-01-21 DIAGNOSIS — I5023 Acute on chronic systolic (congestive) heart failure: Secondary | ICD-10-CM | POA: Diagnosis not present

## 2018-01-22 IMAGING — DX DG CHEST 2V
2 series · 2 of 2 positions shown · non-contrast
Comparison: Two-view chest x-ray 05/08/2016

CLINICAL DATA: Dyspnea. Leg swelling and weakness. Shortness of
breath for 2 days.

EXAM:
CHEST  2 VIEW

[chest lat]
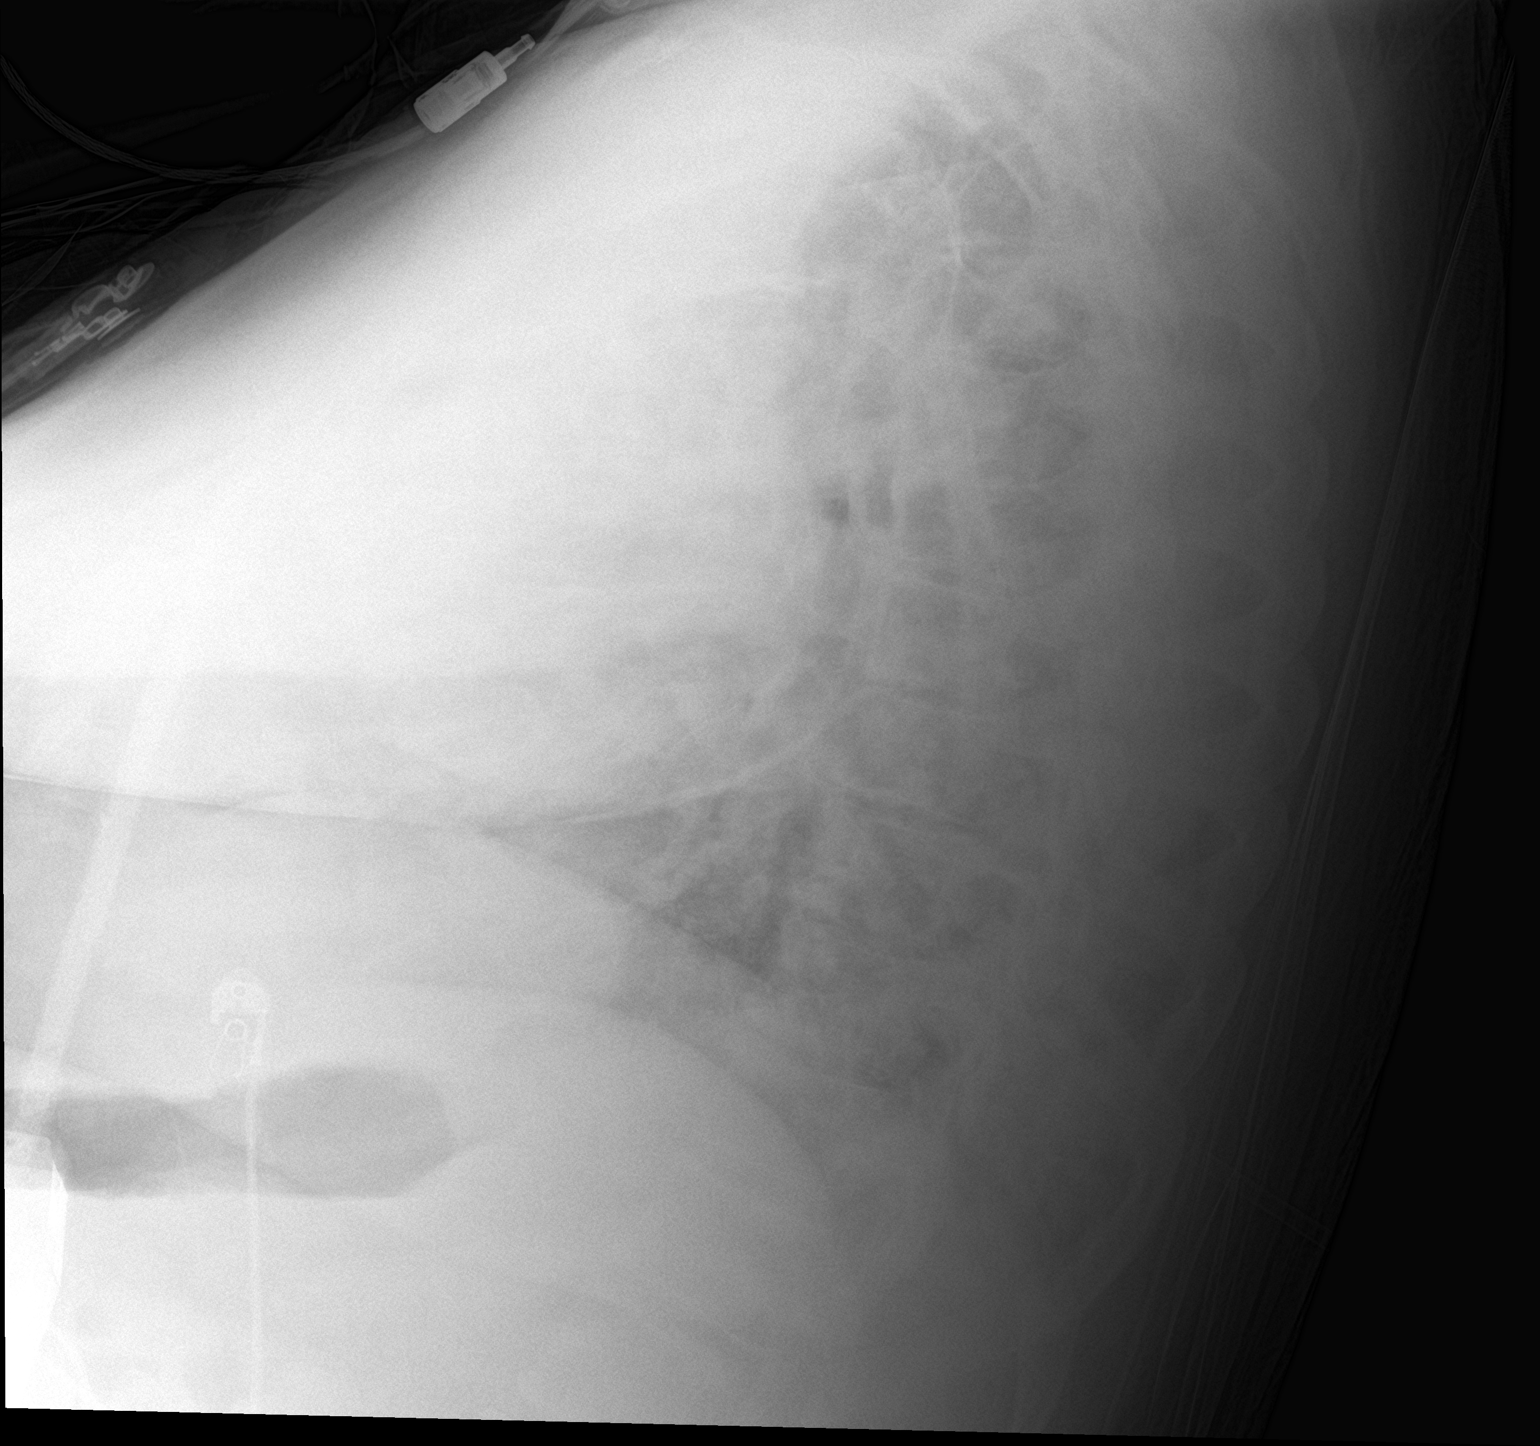

[chest ap]
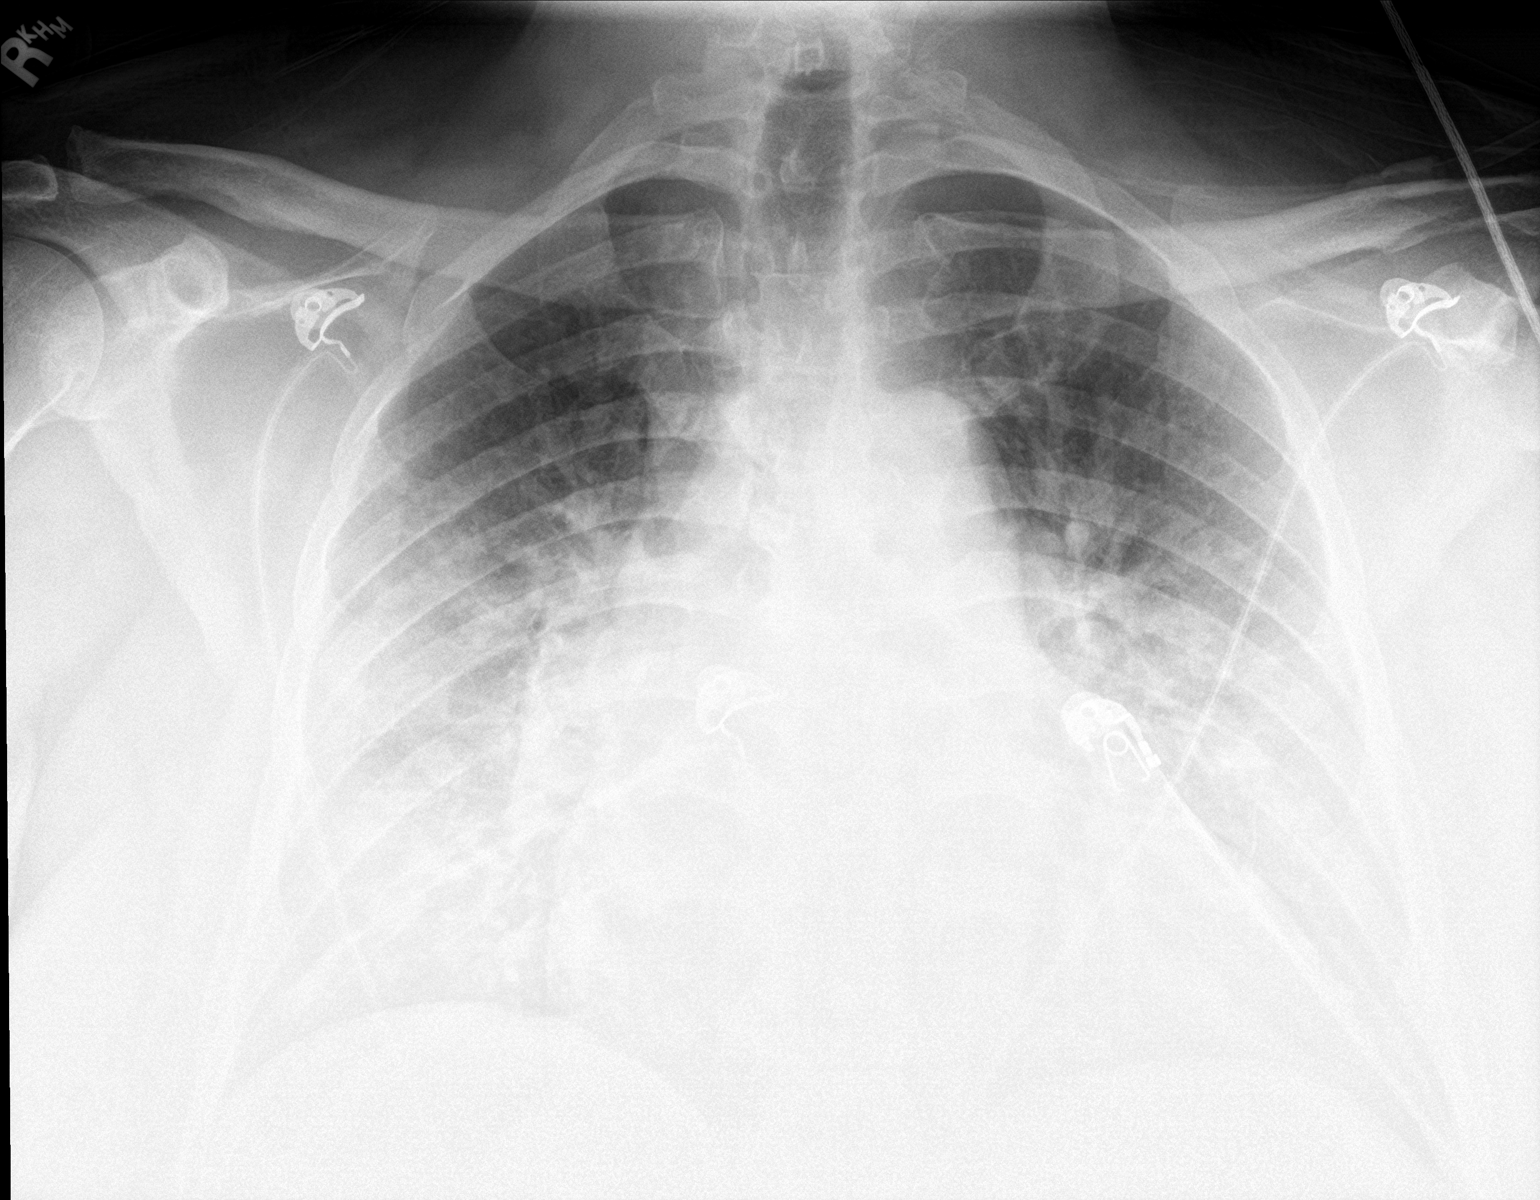

[2 of 2 positions shown; findings below may reference images not displayed]

FINDINGS: The heart is enlarged. Diffuse interstitial edema has increased.
Bilateral effusions are present. Bibasilar airspace disease likely
reflects atelectasis. The visualized soft tissues and bony thorax
are unremarkable.
IMPRESSION: Progressive congestive heart failure.

## 2018-02-08 DIAGNOSIS — I5043 Acute on chronic combined systolic (congestive) and diastolic (congestive) heart failure: Secondary | ICD-10-CM | POA: Diagnosis not present

## 2018-02-25 IMAGING — DX DG CHEST 2V
2 series · 2 of 2 positions shown · non-contrast
Comparison: 06/12/2016 and 06/09/2016

CLINICAL DATA: Shortness of breath for 2 days.

EXAM:
CHEST  2 VIEW

[chest lat]
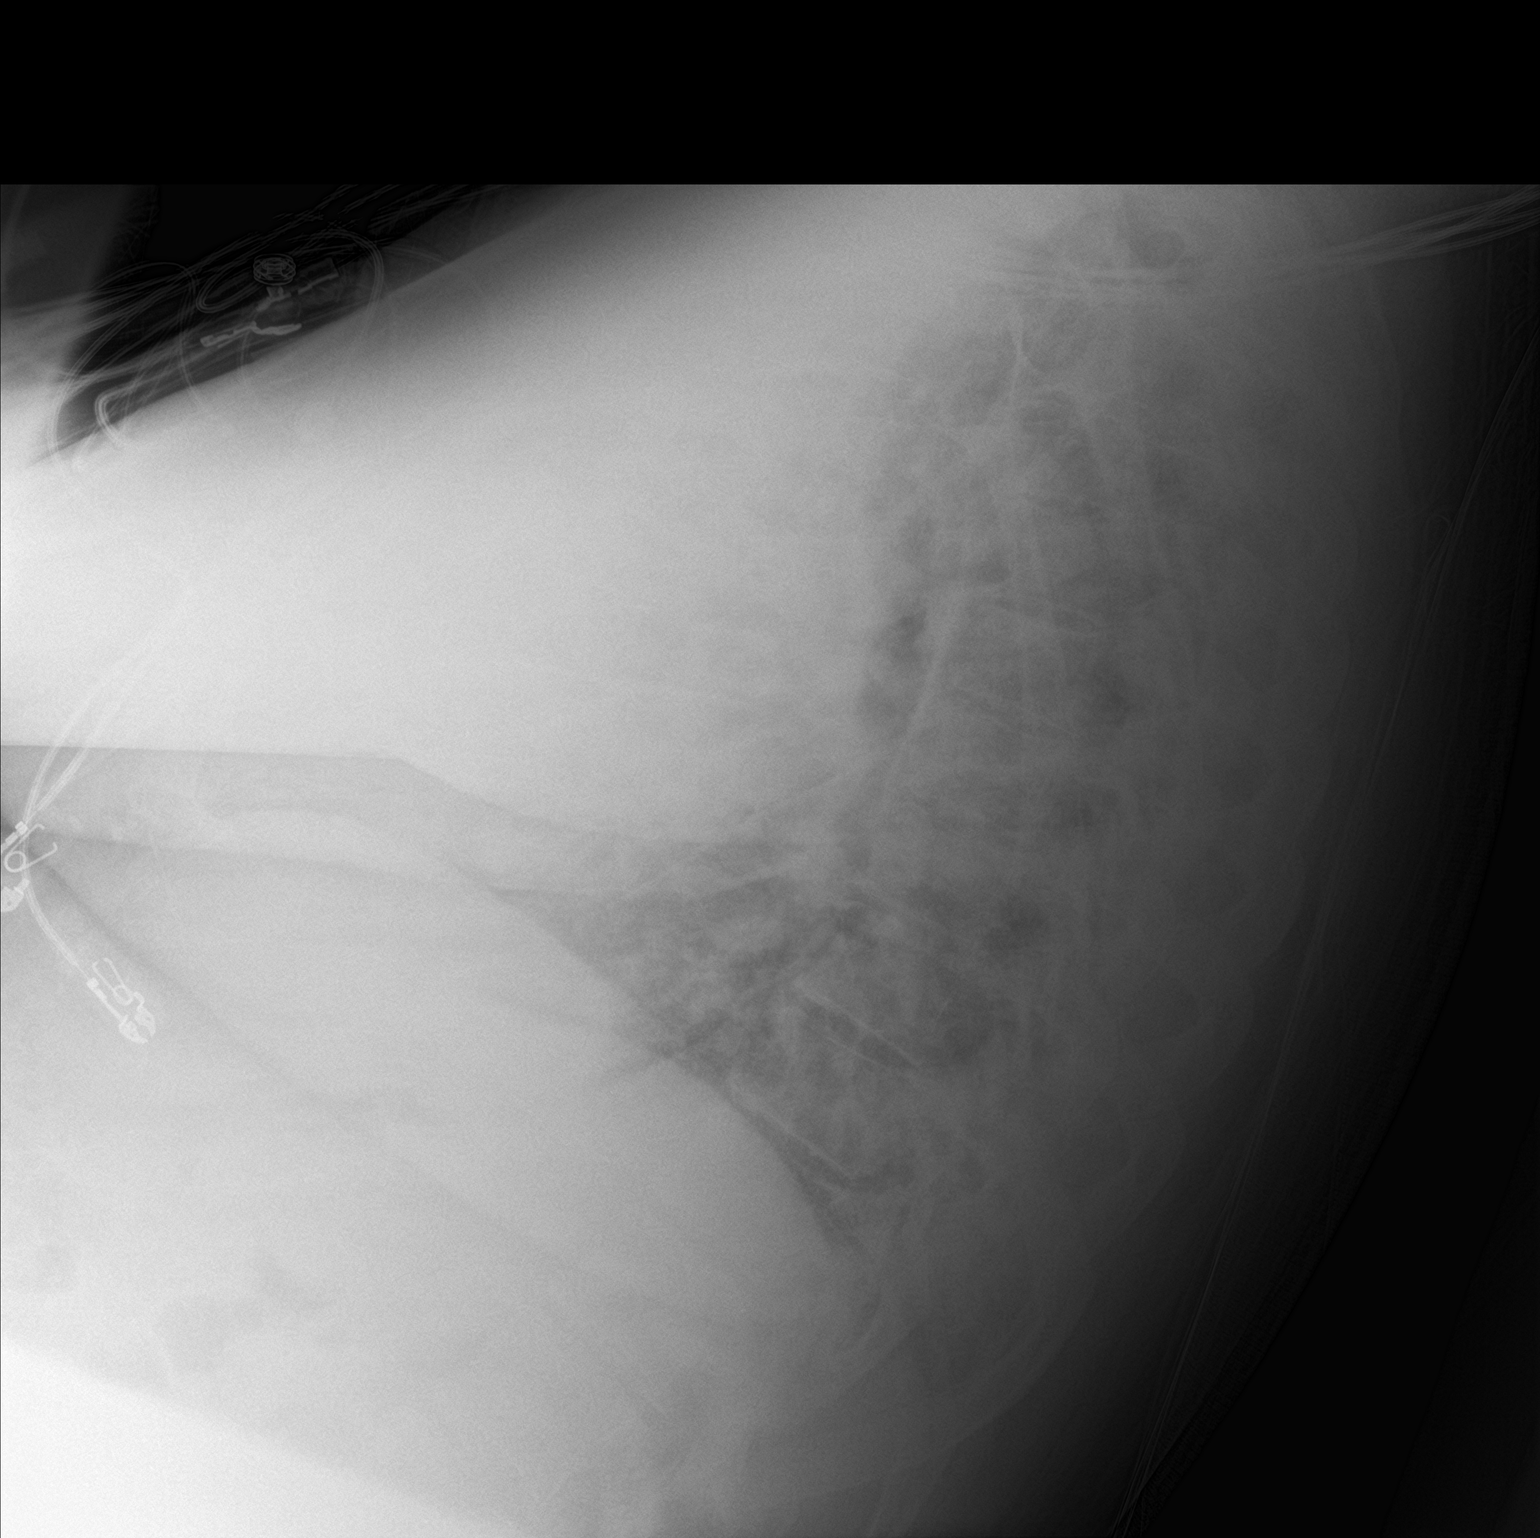

[chest ap]
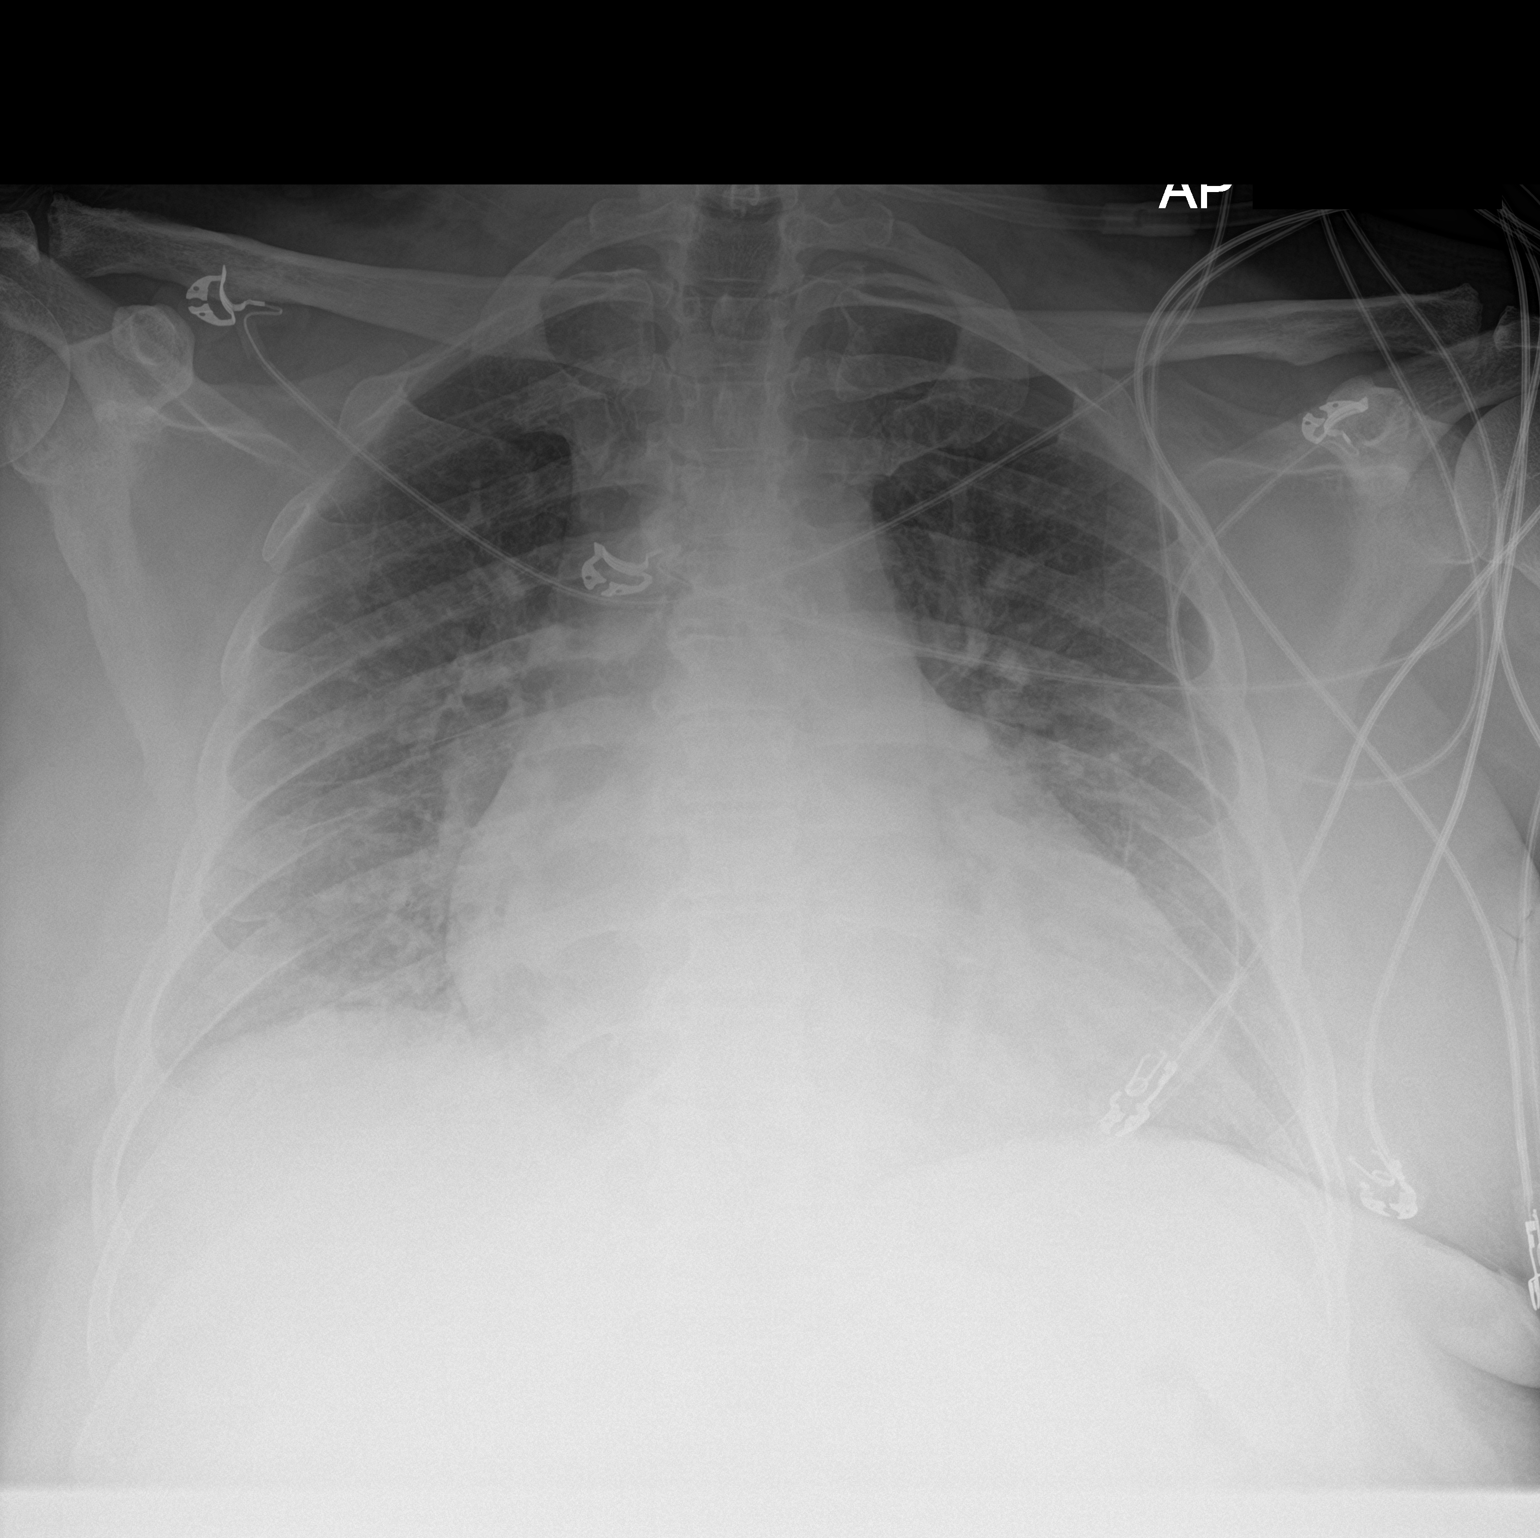

[2 of 2 positions shown; findings below may reference images not displayed]

FINDINGS: There is chronic cardiomegaly. There is slight pulmonary vascular
congestion and slight haziness in both lungs which I suspect
represents mild bilateral pulmonary edema. No discrete effusions.
IMPRESSION: Findings of mild congestive heart failure.

## 2018-02-25 IMAGING — US US EXTREM LOW VENOUS*R*
1 series · 13 of 24 positions shown · non-contrast
Comparison: None.

CLINICAL DATA: Right lower extremity pain and edema for 8 years.
Right leg pain.



[Series 1: us extrem low venous*right* · 0.10mm/px · 13 of 43 slices shown]
[im 1/43]
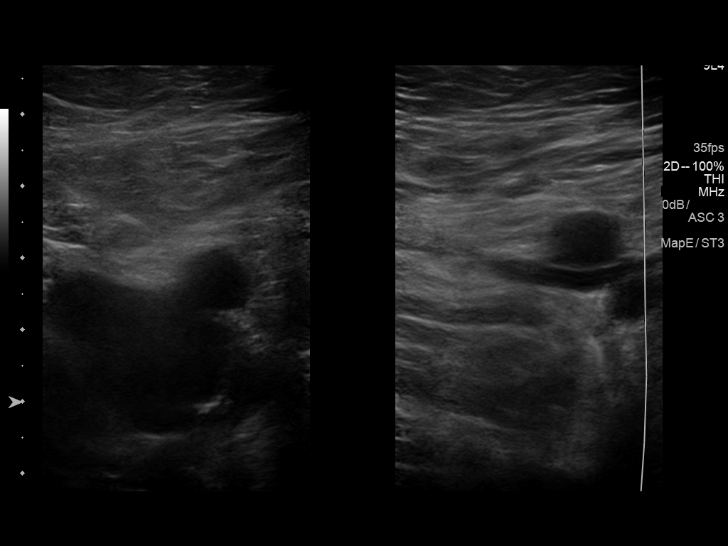
[im 4/43]
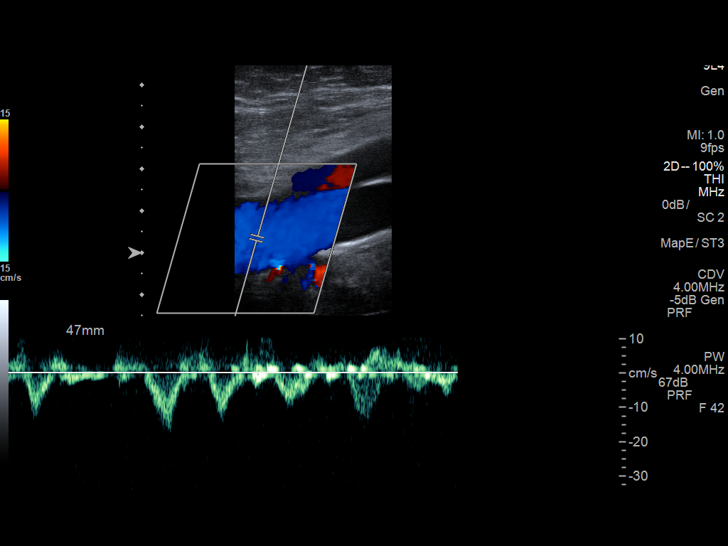
[im 8/43]
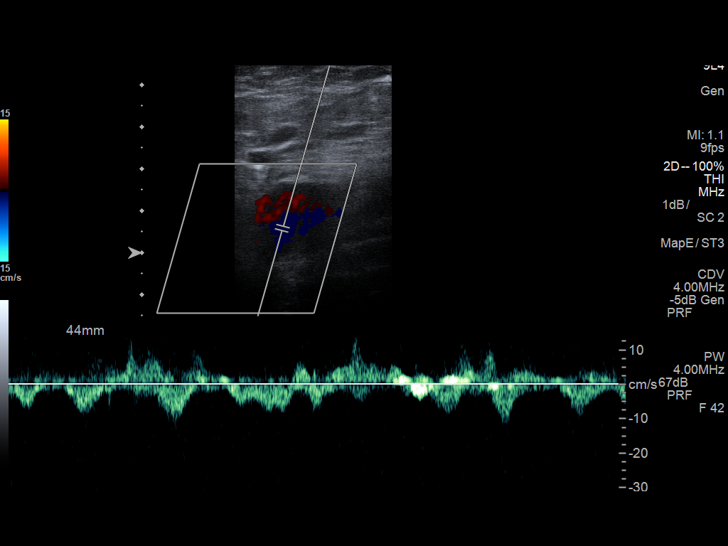
[im 11/43]
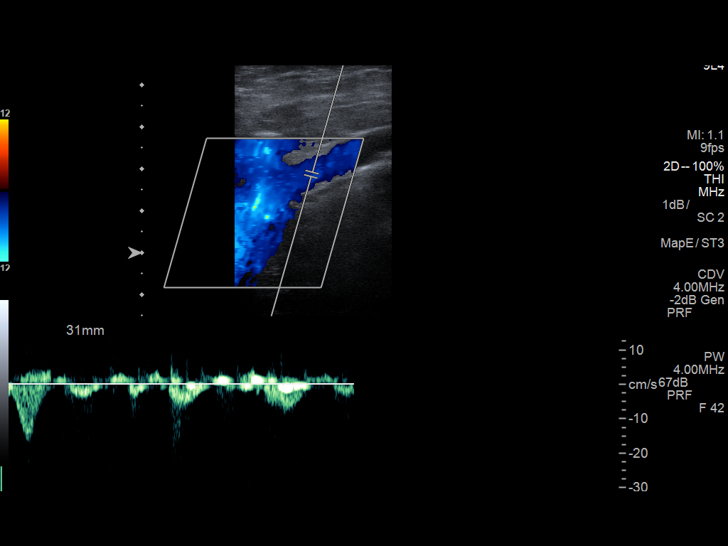
[im 15/43]
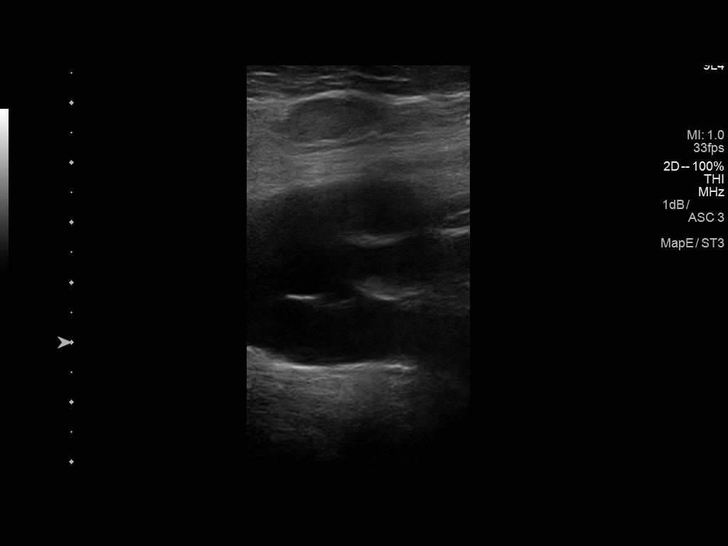
[im 19/43]
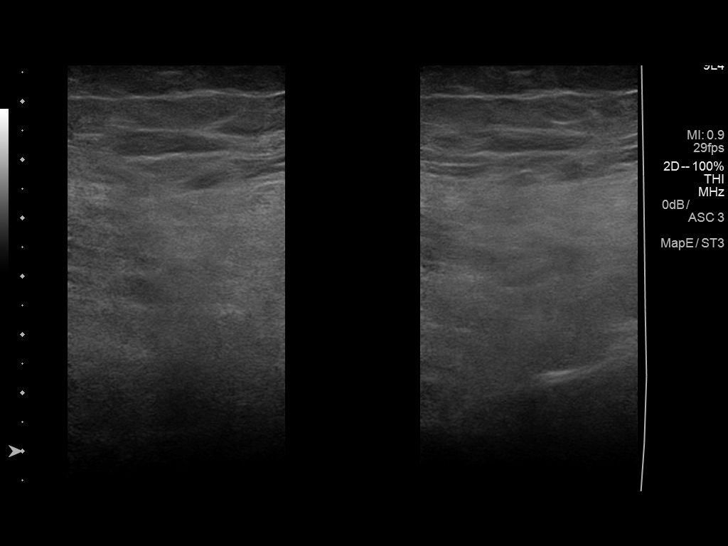
[im 22/43]
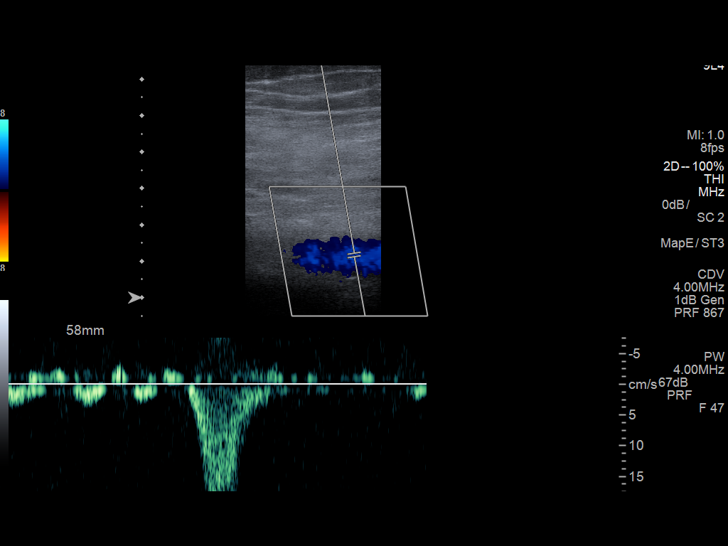
[im 24/43]
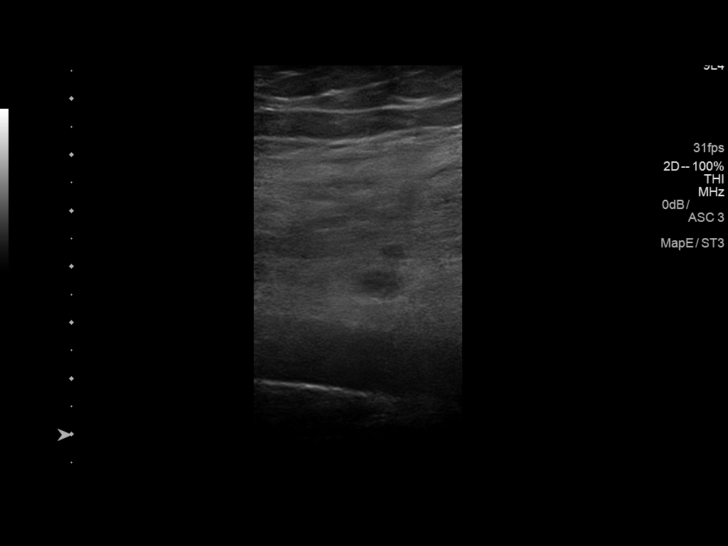
[im 28/43]
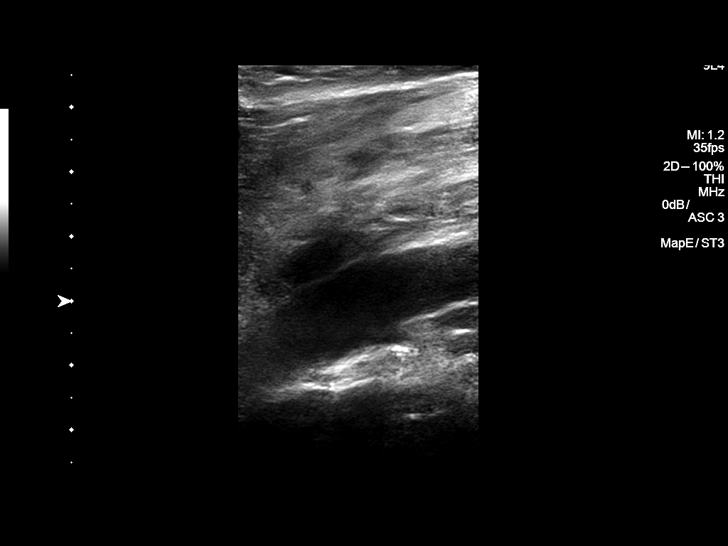
[im 32/43]
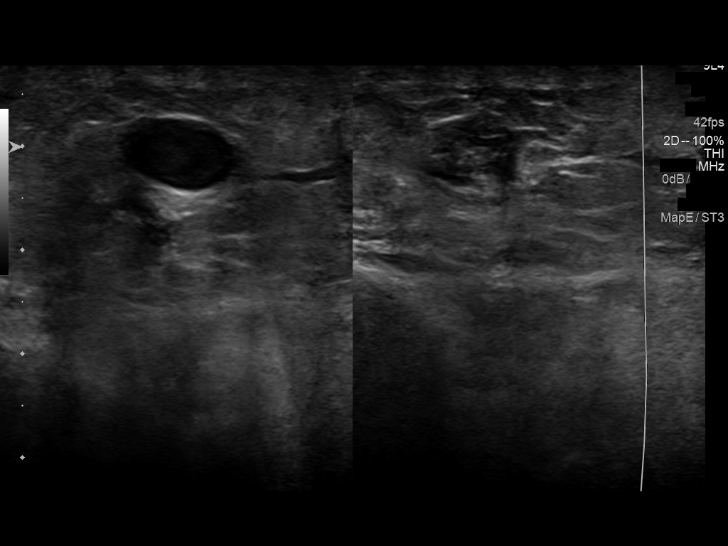
[im 35/43]
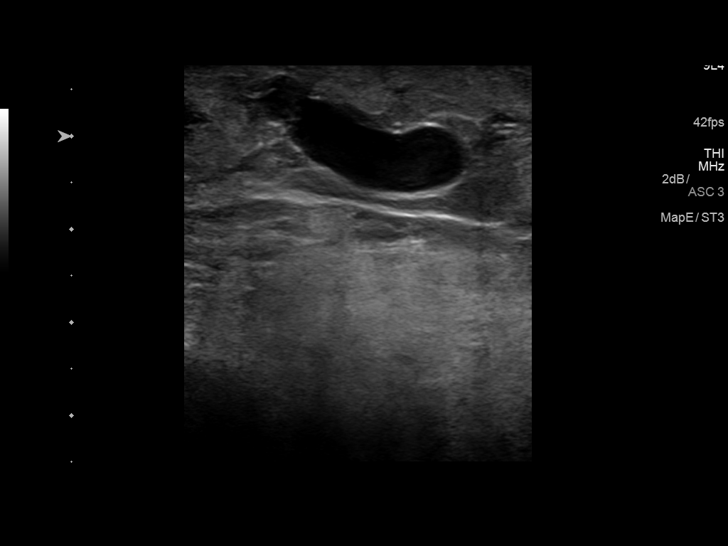
[im 39/43]
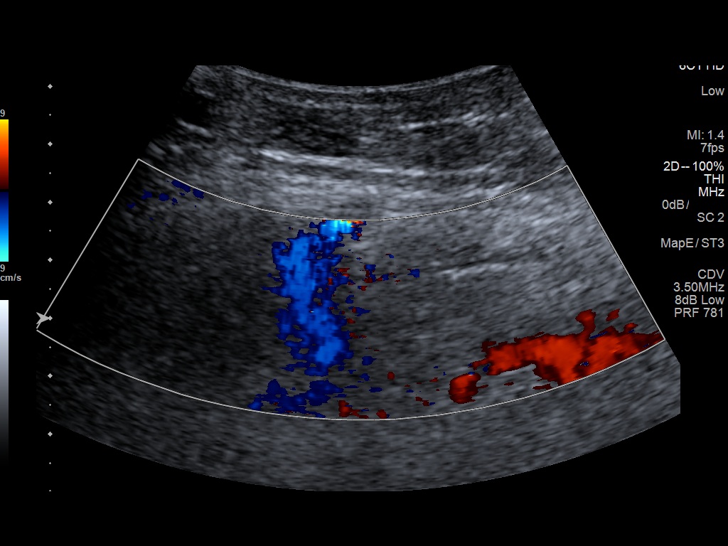
[im 43/43]
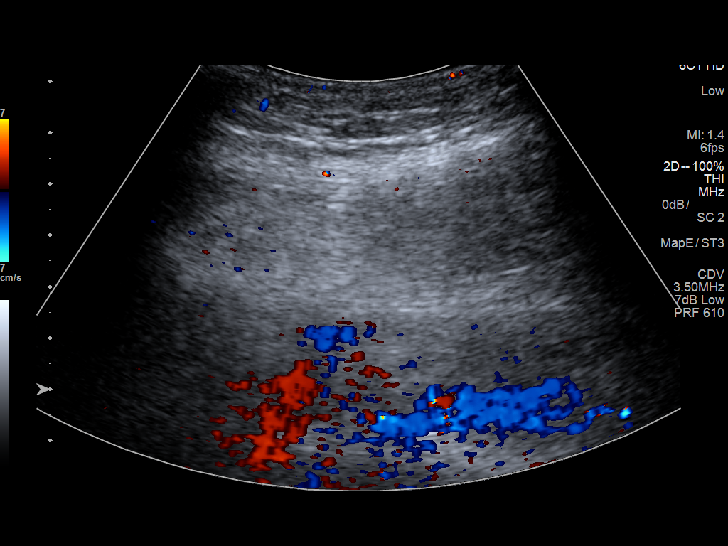

[13 of 24 positions shown; findings below may reference images not displayed]

FINDINGS: Visualization of calf veins is limited due to patient anatomy.

Contralateral Common Femoral Vein: Respiratory phasicity is normal
and symmetric with the symptomatic side. No evidence of thrombus.
Normal compressibility.

Common Femoral Vein: No evidence of thrombus.

Saphenofemoral Junction: No evidence of thrombus.

Profunda Femoral Vein: No evidence of thrombus.

Femoral Vein: No evidence of thrombus.

Popliteal Vein: No evidence of thrombus.

Calf Veins: No evidence of thrombus.

Superficial Great Saphenous Vein: No evidence of thrombus.

Other Findings: There are numerous subcutaneous varicosities in the
medial right calf with some sluggish blood flow but no thrombosis.
IMPRESSION: 1. No evidence of DVT within the right lower extremity.
2. Extensive subcutaneous varicosities in the medial right calf. If
considering varicose vein therapy, could obtain venous mapping in
the IR clinic.

## 2018-02-27 IMAGING — CR DG CHEST 1V PORT
1 series · 1 of 1 positions shown · non-contrast
Comparison: Chest radiograph June 20, 2016

CLINICAL DATA: Shortness of breath for 3 days. History of asthma,
cardiomyopathy.

EXAM:
PORTABLE CHEST 1 VIEW

[portable]
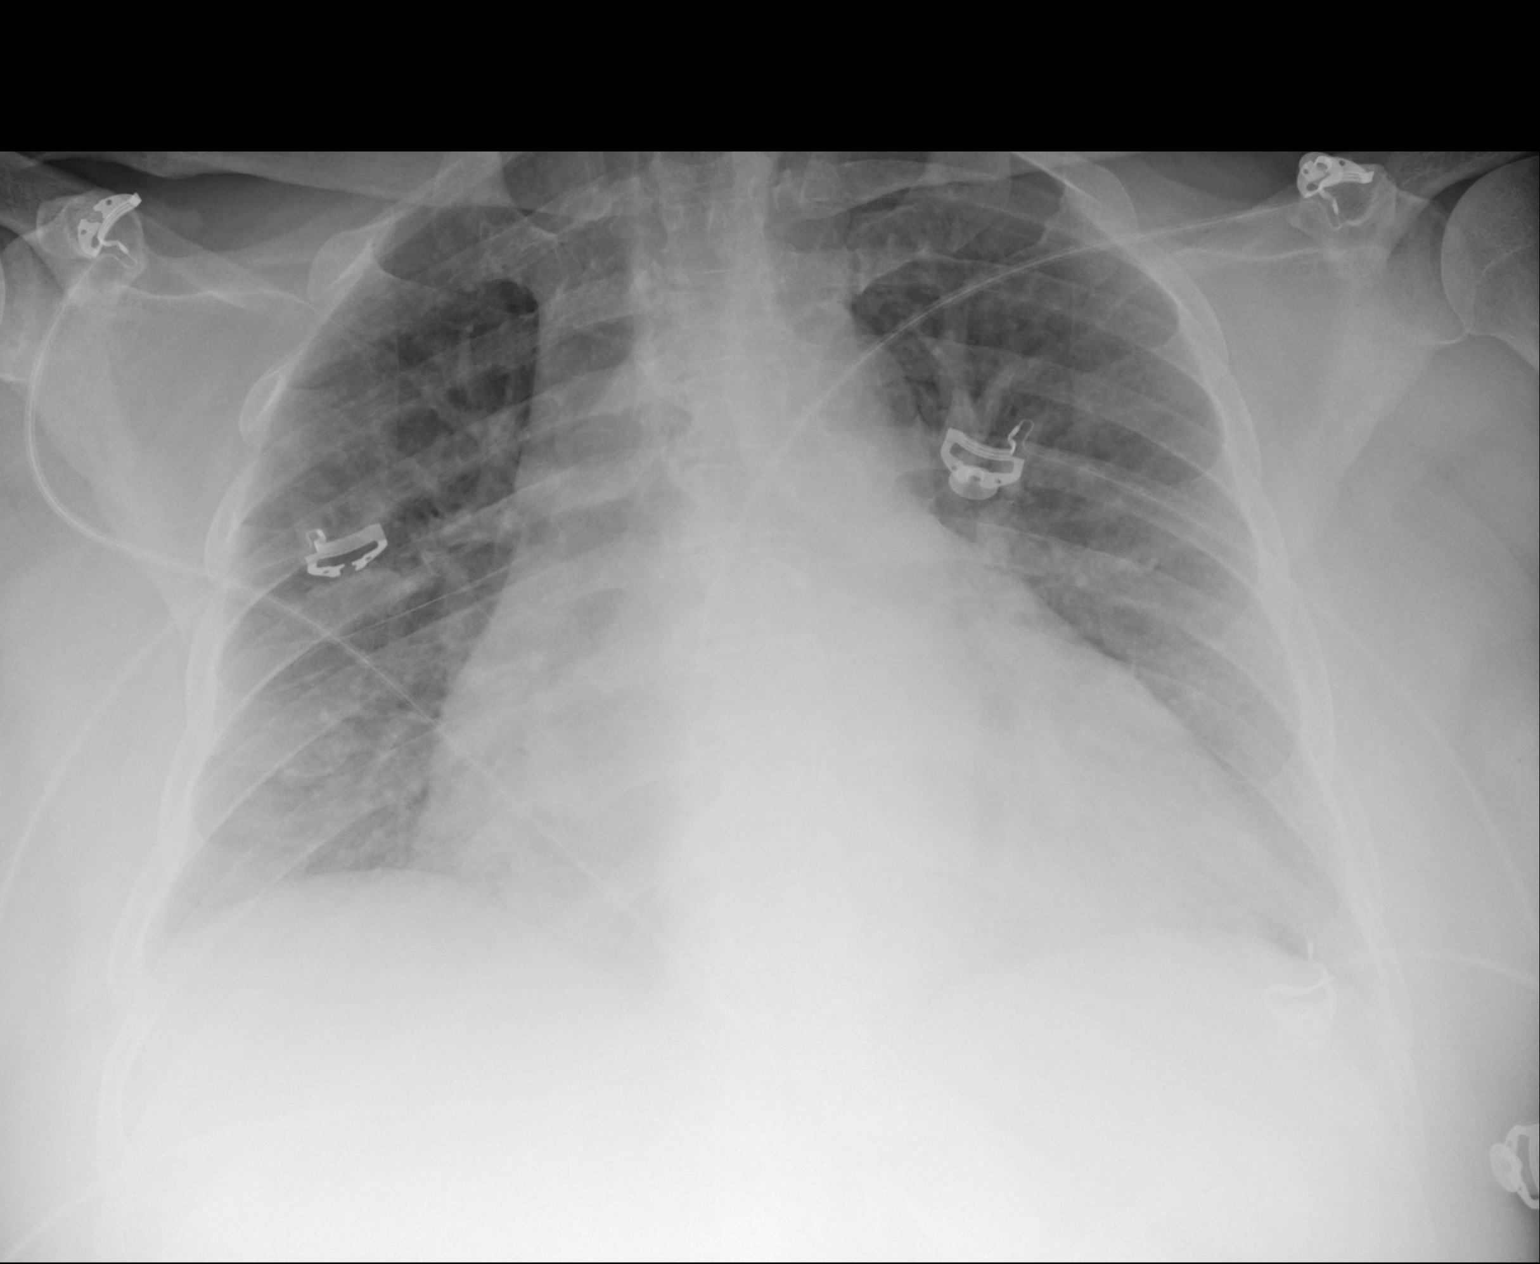

[1 of 1 positions shown; findings below may reference images not displayed]

FINDINGS: The cardiac silhouette is moderate to severely enlarged, unchanged.
Mediastinal silhouette is nonsuspicious. Pulmonary vascular
congestion and interstitial prominence without pleural effusion or
focal consolidation. No pneumothorax. Soft tissue planes and
included osseous structures are nonsuspicious. Large body habitus.
IMPRESSION: Stable cardiomegaly and mild interstitial edema.

## 2018-03-10 ENCOUNTER — Encounter (HOSPITAL_COMMUNITY): Payer: Self-pay | Admitting: *Deleted

## 2018-03-10 ENCOUNTER — Other Ambulatory Visit: Payer: Self-pay

## 2018-03-10 ENCOUNTER — Inpatient Hospital Stay (HOSPITAL_COMMUNITY)
Admission: EM | Admit: 2018-03-10 | Discharge: 2018-03-14 | DRG: 291 | Disposition: A | Discharge status: Home / Self Care | Payer: Medicare Other | Attending: Internal Medicine | Admitting: Internal Medicine

## 2018-03-10 ENCOUNTER — Emergency Department (HOSPITAL_COMMUNITY): Payer: Medicare Other

## 2018-03-10 DIAGNOSIS — Z9981 Dependence on supplemental oxygen: Secondary | ICD-10-CM

## 2018-03-10 DIAGNOSIS — J45909 Unspecified asthma, uncomplicated: Secondary | ICD-10-CM | POA: Diagnosis not present

## 2018-03-10 DIAGNOSIS — E785 Hyperlipidemia, unspecified: Secondary | ICD-10-CM | POA: Diagnosis not present

## 2018-03-10 DIAGNOSIS — N182 Chronic kidney disease, stage 2 (mild): Secondary | ICD-10-CM | POA: Diagnosis present

## 2018-03-10 DIAGNOSIS — Z91030 Bee allergy status: Secondary | ICD-10-CM

## 2018-03-10 DIAGNOSIS — I10 Essential (primary) hypertension: Secondary | ICD-10-CM | POA: Diagnosis not present

## 2018-03-10 DIAGNOSIS — I4891 Unspecified atrial fibrillation: Secondary | ICD-10-CM | POA: Diagnosis present

## 2018-03-10 DIAGNOSIS — Z87891 Personal history of nicotine dependence: Secondary | ICD-10-CM

## 2018-03-10 DIAGNOSIS — Z7982 Long term (current) use of aspirin: Secondary | ICD-10-CM

## 2018-03-10 DIAGNOSIS — R5383 Other fatigue: Secondary | ICD-10-CM | POA: Diagnosis not present

## 2018-03-10 DIAGNOSIS — K219 Gastro-esophageal reflux disease without esophagitis: Secondary | ICD-10-CM | POA: Diagnosis present

## 2018-03-10 DIAGNOSIS — Z8249 Family history of ischemic heart disease and other diseases of the circulatory system: Secondary | ICD-10-CM

## 2018-03-10 DIAGNOSIS — I272 Pulmonary hypertension, unspecified: Secondary | ICD-10-CM | POA: Diagnosis present

## 2018-03-10 DIAGNOSIS — I428 Other cardiomyopathies: Secondary | ICD-10-CM | POA: Diagnosis not present

## 2018-03-10 DIAGNOSIS — I252 Old myocardial infarction: Secondary | ICD-10-CM

## 2018-03-10 DIAGNOSIS — E662 Morbid (severe) obesity with alveolar hypoventilation: Secondary | ICD-10-CM | POA: Diagnosis present

## 2018-03-10 DIAGNOSIS — R531 Weakness: Secondary | ICD-10-CM | POA: Diagnosis not present

## 2018-03-10 DIAGNOSIS — Z888 Allergy status to other drugs, medicaments and biological substances status: Secondary | ICD-10-CM

## 2018-03-10 DIAGNOSIS — Z9111 Patient's noncompliance with dietary regimen: Secondary | ICD-10-CM

## 2018-03-10 DIAGNOSIS — Z6841 Body Mass Index (BMI) 40.0 and over, adult: Secondary | ICD-10-CM

## 2018-03-10 DIAGNOSIS — I5033 Acute on chronic diastolic (congestive) heart failure: Secondary | ICD-10-CM | POA: Diagnosis not present

## 2018-03-10 DIAGNOSIS — I130 Hypertensive heart and chronic kidney disease with heart failure and stage 1 through stage 4 chronic kidney disease, or unspecified chronic kidney disease: Principal | ICD-10-CM | POA: Diagnosis present

## 2018-03-10 DIAGNOSIS — Z7901 Long term (current) use of anticoagulants: Secondary | ICD-10-CM | POA: Diagnosis not present

## 2018-03-10 DIAGNOSIS — I110 Hypertensive heart disease with heart failure: Secondary | ICD-10-CM | POA: Diagnosis not present

## 2018-03-10 DIAGNOSIS — G4733 Obstructive sleep apnea (adult) (pediatric): Secondary | ICD-10-CM | POA: Diagnosis present

## 2018-03-10 DIAGNOSIS — Z9114 Patient's other noncompliance with medication regimen: Secondary | ICD-10-CM

## 2018-03-10 DIAGNOSIS — Z886 Allergy status to analgesic agent status: Secondary | ICD-10-CM | POA: Diagnosis not present

## 2018-03-10 DIAGNOSIS — Z91018 Allergy to other foods: Secondary | ICD-10-CM | POA: Diagnosis not present

## 2018-03-10 DIAGNOSIS — Z9119 Patient's noncompliance with other medical treatment and regimen: Secondary | ICD-10-CM

## 2018-03-10 DIAGNOSIS — Z79899 Other long term (current) drug therapy: Secondary | ICD-10-CM

## 2018-03-10 DIAGNOSIS — L8990 Pressure ulcer of unspecified site, unspecified stage: Secondary | ICD-10-CM | POA: Diagnosis present

## 2018-03-10 DIAGNOSIS — E876 Hypokalemia: Secondary | ICD-10-CM | POA: Diagnosis not present

## 2018-03-10 DIAGNOSIS — I509 Heart failure, unspecified: Secondary | ICD-10-CM | POA: Diagnosis not present

## 2018-03-10 DIAGNOSIS — I5023 Acute on chronic systolic (congestive) heart failure: Secondary | ICD-10-CM | POA: Diagnosis not present

## 2018-03-10 DIAGNOSIS — R000 Tachycardia, unspecified: Secondary | ICD-10-CM | POA: Diagnosis not present

## 2018-03-10 LAB — CBC WITH DIFFERENTIAL/PLATELET
Basophils Absolute: 0 10*3/uL (ref 0.0–0.1)
Basophils Relative: 0 %
Eosinophils Absolute: 0.1 10*3/uL (ref 0.0–0.7)
Eosinophils Relative: 2 %
HEMATOCRIT: 42.4 % (ref 39.0–52.0)
HEMOGLOBIN: 14.2 g/dL (ref 13.0–17.0)
LYMPHS ABS: 1.4 10*3/uL (ref 0.7–4.0)
Lymphocytes Relative: 25 %
MCH: 29.5 pg (ref 26.0–34.0)
MCHC: 33.5 g/dL (ref 30.0–36.0)
MCV: 88.1 fL (ref 78.0–100.0)
MONO ABS: 0.4 10*3/uL (ref 0.1–1.0)
MONOS PCT: 8 %
NEUTROS ABS: 3.5 10*3/uL (ref 1.7–7.7)
NEUTROS PCT: 65 %
PLATELETS: 201 10*3/uL (ref 150–400)
RBC: 4.81 MIL/uL (ref 4.22–5.81)
RDW: 13.8 % (ref 11.5–15.5)
WBC: 5.4 10*3/uL (ref 4.0–10.5)

## 2018-03-10 LAB — BLOOD GAS, ARTERIAL
ACID-BASE EXCESS: 6.8 mmol/L — AB (ref 0.0–2.0)
Bicarbonate: 30.4 mmol/L — ABNORMAL HIGH (ref 20.0–28.0)
Drawn by: 382351
FIO2: 32
O2 SAT: 98.7 %
PCO2 ART: 42.9 mmHg (ref 32.0–48.0)
Patient temperature: 37
pH, Arterial: 7.467 — ABNORMAL HIGH (ref 7.350–7.450)
pO2, Arterial: 121 mmHg — ABNORMAL HIGH (ref 83.0–108.0)

## 2018-03-10 LAB — I-STAT TROPONIN, ED: Troponin i, poc: 0.03 ng/mL (ref 0.00–0.08)

## 2018-03-10 NOTE — ED Notes (Signed)
Patient transported to X-ray 

## 2018-03-10 NOTE — ED Provider Notes (Signed)
[facility] EMERGENCY DEPARTMENT Provider Note   CSN: [id number] Arrival date & time: 03/10/18  2226     History   Chief Complaint Chief Complaint  Patient presents with  . Weakness    HPI [name] is a 52 y.o. male.  Patient presents to the emergency department for evaluation of generalized weakness.  He reports that he has been weak for approximately 1 week's time.  He thinks that he has been experiencing worsening congestive heart failure.  He reports increased fluid retention and shortness of breath.  No associated chest pain.     Past Medical History:  Diagnosis Date  . Allergic rhinitis   . Asthma   . Atrial fibrillation (HCC)   . CHF (congestive heart failure) (HCC)   . Essential hypertension   . Gastroesophageal reflux disease   . Heart attack (HCC)   . History of cardiac catheterization    Normal coronaries December 2016  . Noncompliance    Major problem leading to declining health and recurrent hospitalization  . Nonischemic cardiomyopathy (HCC)    LVEF 20-25%  . On home O2    3L N/C  . OSA (obstructive sleep apnea)   . Osteoarthritis   . Peptic ulcer disease     Patient Active Problem List   Diagnosis Date Noted  . CHF (congestive heart failure) (HCC) 09/08/2017  . Altered mental status 05/11/2017  . Acute exacerbation of CHF (congestive heart failure) (HCC) 04/20/2017  . Acute on chronic systolic (congestive) heart failure (HCC) 04/19/2017  . CHF exacerbation (HCC) 04/11/2017  . Chronic respiratory failure with hypoxia (HCC) 04/11/2017  . Encounter for hospice care discussion   . Palliative care encounter   . Goals of care, counseling/discussion   . DNR (do not resuscitate)   . DNR (do not resuscitate) discussion   . Chronic congestive heart failure (HCC) 02/18/2017  . Acute systolic heart failure (HCC) 01/18/2017  . Pedal edema 12/02/2016  . Acute CHF (congestive heart failure) (HCC) 11/09/2016  . Acute on chronic systolic CHF  (congestive heart failure) (HCC) 08/19/2016  . CKD (chronic kidney disease), stage II 05/17/2016  . Coronary arteries, normal Dec 2016 01/23/2016  . Chronic anticoagulation-Xarelto 01/23/2016  . NSVT (nonsustained ventricular tachycardia) (HCC) 01/23/2016  . Elevated troponin 12/21/2015  . Nonischemic cardiomyopathy (HCC)   . Morbid obesity due to excess calories (HCC)   . Noncompliance with diet and medication regimen 12/02/2015  . OSA (obstructive sleep apnea) 09/20/2015  . Pulmonary hypertension (HCC) 09/19/2015  . Normocytic anemia 09/19/2015  . Atrial fibrillation, chronic (HCC)   . Dyspnea on exertion 05/28/2015  . Acute respiratory failure with hypoxia (HCC) 04/01/2015  . Hypokalemia 04/01/2015  . Obesity hypoventilation syndrome (HCC) 08/08/2014  . GERD (gastroesophageal reflux disease) 08/08/2014  . Edema of right lower extremity 06/21/2014  . Fecal urgency 06/21/2014  . Asthma 02/11/2009  . Hyperlipidemia 07/12/2008  . OBESITY, MORBID 07/12/2008  . Anxiety state 07/12/2008  . DEPRESSION 07/12/2008  . Essential hypertension 07/12/2008  . ALLERGIC RHINITIS 07/12/2008  . Peptic ulcer 07/12/2008  . OSTEOARTHRITIS 07/12/2008    Past Surgical History:  Procedure Laterality Date  . CARDIAC CATHETERIZATION N/A 06/14/2015   Procedure: Right/Left Heart Cath and Coronary Angiography;  Surgeon: Runell Gess, MD;  Location: [facility] INVASIVE CV LAB;  Service: Cardiovascular;  Laterality: N/A;        Home Medications    Prior to Admission medications   Medication Sig Start Date End Date Taking? Authorizing Provider  albuterol (  PROVENTIL) (2.5 MG/3ML) 0.083% nebulizer solution Take 2.5 mg by nebulization every 6 (six) hours as needed for wheezing or shortness of breath.    [provider]  aspirin 81 MG chewable tablet Chew 1 tablet (81 mg total) by mouth daily. 04/13/17   Erick Blinks, MD  atorvastatin (LIPITOR) 40 MG tablet Take 1 tablet (40 mg total) by mouth  daily at 6 PM. 06/25/16   Kathlen Mody, MD  beclomethasone (QVAR) 80 MCG/ACT inhaler Inhale 2 puffs into the lungs 2 (two) times daily.     [provider]  bumetanide (BUMEX) 2 MG tablet Take 2 mg by mouth 2 (two) times daily.    [provider]  carvedilol (COREG) 25 MG tablet Take 1.5 tablets (37.5 mg total) by mouth 2 (two) times daily with a meal. 10/07/17   [name], MD  docusate sodium (COLACE) 100 MG capsule Take 1 capsule (100 mg total) by mouth 2 (two) times daily. Patient taking differently: Take 100 mg by mouth 2 (two) times daily as needed for mild constipation.  06/25/16   Kathlen Mody, MD  ferrous gluconate (FERGON) 324 MG tablet Take 1 tablet (324 mg total) by mouth 2 (two) times daily with a meal. Patient taking differently: Take 324 mg by mouth daily with breakfast.  06/25/16   Kathlen Mody, MD  lisinopril (PRINIVIL,ZESTRIL) 5 MG tablet Take 1 tablet (5 mg total) by mouth daily. 10/07/17   [name], MD  OXYGEN Inhale 3.5 L into the lungs daily.    [provider]  pantoprazole (PROTONIX) 40 MG tablet Take 1 tablet (40 mg total) by mouth daily at 6 (six) AM. Patient taking differently: Take 40 mg by mouth every evening.  06/26/16   Kathlen Mody, MD  potassium chloride (K-DUR,KLOR-CON) 20 MEQ tablet Take 1 tablet (20 mEq total) by mouth daily. 02/20/17   Houston Siren, MD  rivaroxaban (XARELTO) 20 MG TABS tablet Take 1 tablet (20 mg total) by mouth daily with breakfast. Patient taking differently: Take 20 mg by mouth daily with supper.  06/26/16   Kathlen Mody, MD  spironolactone (ALDACTONE) 50 MG tablet Take 50 mg daily by mouth. For fluid 03/18/17   [provider]  tamsulosin (FLOMAX) 0.4 MG CAPS capsule Take 1 capsule (0.4 mg total) by mouth daily. 06/25/16   Kathlen Mody, MD  potassium chloride 20 MEQ TBCR Take 20 mEq by mouth 2 (two) times daily. 09/03/15 09/03/15  Ward, Layla Maw, DO    Family History Family History  Problem  Relation Age of Onset  . Stroke Father   . Heart attack Father   . Aneurysm Mother        Cerebral aneurysm  . Hypertension Sister   . Colon cancer Neg Hx   . Inflammatory bowel disease Neg Hx   . Liver disease Neg Hx     Social History Social History   Tobacco Use  . Smoking status: Former Smoker    Packs/day: 0.50    Years: 20.00    Pack years: 10.00    Types: Cigarettes    Start date: 04/26/1988    Last attempt to quit: 06/23/2007    Years since quitting: 10.7  . Smokeless tobacco: Never Used  . Tobacco comment: 1 ppd former smoker  Substance Use Topics  . Alcohol use: No    Alcohol/week: 0.0 standard drinks    Comment: No etoh since 2009  . Drug use: No     Allergies   Banana;  Bee venom; Food; Aspirin; Metolazone; Oatmeal; Orange juice [orange oil]; Torsemide; Diltiazem; Hydralazine; and Lipitor [atorvastatin]   Review of Systems Review of Systems  Constitutional: Positive for fatigue.  Respiratory: Positive for shortness of breath.   Cardiovascular: Positive for leg swelling.  All other systems reviewed and are negative.    Physical Exam Updated Vital Signs BP (!) 118/59   Pulse 64   Temp 99.2 F (37.3 C) (Oral)   Resp 18   Ht 6' (1.829 m)   Wt (!) 167.8 kg   SpO2 100%   BMI 50.18 kg/m   Physical Exam  Constitutional: He is oriented to person, place, and time. He appears well-developed and well-nourished. He appears listless. No distress.  HENT:  Head: Normocephalic and atraumatic.  Right Ear: Hearing normal.  Left Ear: Hearing normal.  Nose: Nose normal.  Mouth/Throat: Oropharynx is clear and moist and mucous membranes are normal.  Eyes: Pupils are equal, round, and reactive to light. Conjunctivae and EOM are normal.  Neck: Normal range of motion. Neck supple.  Cardiovascular: Regular rhythm, S1 normal and S2 normal. Exam reveals no gallop and no friction rub.  No murmur heard. Pulmonary/Chest: Effort normal and breath sounds normal. No  respiratory distress. He exhibits no tenderness.  Abdominal: Soft. Normal appearance and bowel sounds are normal. There is no hepatosplenomegaly. There is no tenderness. There is no rebound, no guarding, no tenderness at McBurney's point and negative Murphy's sign. No hernia.  Musculoskeletal: Normal range of motion. He exhibits edema.  Neurological: He is oriented to person, place, and time. He has normal strength. He appears listless. No cranial nerve deficit or sensory deficit. Coordination normal. GCS eye subscore is 4. GCS verbal subscore is 5. GCS motor subscore is 6.  Somnolent  Skin: Skin is warm, dry and intact. No rash noted. No cyanosis.  Psychiatric: He has a normal mood and affect. His speech is normal and behavior is normal. Thought content normal.  Nursing note and vitals reviewed.    ED Treatments / Results  Labs (all labs ordered are listed, but only abnormal results are displayed) Labs Reviewed  COMPREHENSIVE METABOLIC PANEL - Abnormal; Notable for the following components:      Result Value   Potassium 2.5 (*)    CO2 33 (*)    Calcium 8.6 (*)    All other components within normal limits  BLOOD GAS, ARTERIAL - Abnormal; Notable for the following components:   pH, Arterial 7.467 (*)    pO2, Arterial 121 (*)    Bicarbonate 30.4 (*)    Acid-Base Excess 6.8 (*)    All other components within normal limits  CBC WITH DIFFERENTIAL/PLATELET  BRAIN NATRIURETIC PEPTIDE  URINALYSIS, ROUTINE W REFLEX MICROSCOPIC  MAGNESIUM  I-STAT TROPONIN, ED  I-STAT TROPONIN, ED    EKG EKG Interpretation  Date/Time:  Thursday March 10 2018 22:28:57 EDT Ventricular Rate:  90 PR Interval:    QRS Duration: 96 QT Interval:  345 QTC Calculation: 423 R Axis:   51 Text Interpretation:  Atrial fibrillation Nonspecific repol abnormality, diffuse leads Confirmed by Gilda Crease [phone]) on 03/10/2018 11:01:18 PM   Radiology Dg Chest 2 View  Result Date: 03/11/2018 CLINICAL  DATA:  Weakness and dyspnea x1 week. EXAM: CHEST - 2 VIEW COMPARISON:  None. FINDINGS: Stable cardiomegaly and mediastinal contours. Mild to moderate central pulmonary vascular congestion. No acute osseous abnormality. IMPRESSION: Cardiomegaly with stigmata of mild CHF. Electronically Signed   By: Tollie Eth M.D.   On:  03/11/2018 00:22    Procedures Procedures (including critical care time)  Medications Ordered in ED Medications  0.9 %  sodium chloride infusion ( Intravenous Stopped 03/11/18 0426)  potassium chloride SA (K-DUR,KLOR-CON) CR tablet 40 mEq (has no administration in time range)  furosemide (LASIX) injection 60 mg (has no administration in time range)  potassium chloride SA (K-DUR,KLOR-CON) CR tablet 40 mEq (40 mEq Oral Given 03/11/18 0052)  potassium chloride 10 mEq in 100 mL IVPB (10 mEq Intravenous New Bag/Given 03/11/18 0428)  0.9 %  sodium chloride infusion ( Intravenous New Bag/Given 03/11/18 0322)     Initial Impression / Assessment and Plan / ED Course  I have reviewed the triage vital signs and the nursing notes.  Pertinent labs & imaging results that were available during my care of the patient were reviewed by me and considered in my medical decision making (see chart for details).     Patient presents to the emergency department for evaluation of weakness and shortness of breath.  He has been feeling very weak for 1 week.  He reports shortness of breath, but his oxygenation is normal here.  He is not experiencing chest pain.  Vital signs are normal.  Patient reports a history of CHF and feels like he might be holding onto some fluid.  He has BNP, however, is unremarkable.  He has changes of chronic stasis of his lower extremities, no evidence of cellulitis.  Chest x-ray shows mild CHF, but he is in no distress.  He did have significant hypokalemia.  He was given oral and IV replacement here in the ER and was monitored.  Patient reports worsening shortness of breath,  inability to walk.  He is requiring oxygen here in the ER, only uses it as needed at home.  Patient states he cannot go home, will require hospitalization.  CRITICAL CARE Performed by: Gilda Crease   Total critical care time: 30 minutes  Critical care time was exclusive of separately billable procedures and treating other patients.  Critical care was necessary to treat or prevent imminent or life-threatening deterioration.  Critical care was time spent personally by me on the following activities: development of treatment plan with patient and/or surrogate as well as nursing, discussions with consultants, evaluation of patient's response to treatment, examination of patient, obtaining history from patient or surrogate, ordering and performing treatments and interventions, ordering and review of laboratory studies, ordering and review of radiographic studies, pulse oximetry and re-evaluation of patient's condition.   Final Clinical Impressions(s) / ED Diagnoses   Final diagnoses:  Hypokalemia  Acute on chronic systolic (congestive) heart failure [facility])    ED Discharge Orders    None       Gilda Crease, MD 03/11/18 [phone]

## 2018-03-10 NOTE — ED Notes (Signed)
I-stat Troponin value 0.03

## 2018-03-10 NOTE — ED Triage Notes (Signed)
Generalized weakness with difficulty breathing for one week.  02 sats  98% 3 L02.  Patient on 02 at home when needed.

## 2018-03-11 ENCOUNTER — Encounter (HOSPITAL_COMMUNITY): Payer: Self-pay | Admitting: *Deleted

## 2018-03-11 ENCOUNTER — Other Ambulatory Visit: Payer: Self-pay

## 2018-03-11 DIAGNOSIS — N182 Chronic kidney disease, stage 2 (mild): Secondary | ICD-10-CM | POA: Diagnosis not present

## 2018-03-11 DIAGNOSIS — E785 Hyperlipidemia, unspecified: Secondary | ICD-10-CM | POA: Diagnosis present

## 2018-03-11 DIAGNOSIS — K219 Gastro-esophageal reflux disease without esophagitis: Secondary | ICD-10-CM | POA: Diagnosis present

## 2018-03-11 DIAGNOSIS — Z888 Allergy status to other drugs, medicaments and biological substances status: Secondary | ICD-10-CM | POA: Diagnosis not present

## 2018-03-11 DIAGNOSIS — I130 Hypertensive heart and chronic kidney disease with heart failure and stage 1 through stage 4 chronic kidney disease, or unspecified chronic kidney disease: Secondary | ICD-10-CM | POA: Diagnosis present

## 2018-03-11 DIAGNOSIS — Z7982 Long term (current) use of aspirin: Secondary | ICD-10-CM | POA: Diagnosis not present

## 2018-03-11 DIAGNOSIS — I5043 Acute on chronic combined systolic (congestive) and diastolic (congestive) heart failure: Secondary | ICD-10-CM | POA: Diagnosis not present

## 2018-03-11 DIAGNOSIS — I509 Heart failure, unspecified: Secondary | ICD-10-CM | POA: Diagnosis not present

## 2018-03-11 DIAGNOSIS — J45909 Unspecified asthma, uncomplicated: Secondary | ICD-10-CM | POA: Diagnosis present

## 2018-03-11 DIAGNOSIS — Z9114 Patient's other noncompliance with medication regimen: Secondary | ICD-10-CM | POA: Diagnosis not present

## 2018-03-11 DIAGNOSIS — Z9111 Patient's noncompliance with dietary regimen: Secondary | ICD-10-CM | POA: Diagnosis not present

## 2018-03-11 DIAGNOSIS — I10 Essential (primary) hypertension: Secondary | ICD-10-CM | POA: Diagnosis not present

## 2018-03-11 DIAGNOSIS — I252 Old myocardial infarction: Secondary | ICD-10-CM | POA: Diagnosis not present

## 2018-03-11 DIAGNOSIS — Z6841 Body Mass Index (BMI) 40.0 and over, adult: Secondary | ICD-10-CM | POA: Diagnosis not present

## 2018-03-11 DIAGNOSIS — I272 Pulmonary hypertension, unspecified: Secondary | ICD-10-CM | POA: Diagnosis present

## 2018-03-11 DIAGNOSIS — Z91030 Bee allergy status: Secondary | ICD-10-CM | POA: Diagnosis not present

## 2018-03-11 DIAGNOSIS — L8990 Pressure ulcer of unspecified site, unspecified stage: Secondary | ICD-10-CM | POA: Diagnosis present

## 2018-03-11 DIAGNOSIS — E662 Morbid (severe) obesity with alveolar hypoventilation: Secondary | ICD-10-CM | POA: Diagnosis present

## 2018-03-11 DIAGNOSIS — I5033 Acute on chronic diastolic (congestive) heart failure: Secondary | ICD-10-CM | POA: Diagnosis not present

## 2018-03-11 DIAGNOSIS — Z91018 Allergy to other foods: Secondary | ICD-10-CM | POA: Diagnosis not present

## 2018-03-11 DIAGNOSIS — Z7901 Long term (current) use of anticoagulants: Secondary | ICD-10-CM | POA: Diagnosis not present

## 2018-03-11 DIAGNOSIS — I428 Other cardiomyopathies: Secondary | ICD-10-CM | POA: Diagnosis present

## 2018-03-11 DIAGNOSIS — Z9981 Dependence on supplemental oxygen: Secondary | ICD-10-CM | POA: Diagnosis not present

## 2018-03-11 DIAGNOSIS — Z87891 Personal history of nicotine dependence: Secondary | ICD-10-CM | POA: Diagnosis not present

## 2018-03-11 DIAGNOSIS — E876 Hypokalemia: Secondary | ICD-10-CM | POA: Diagnosis present

## 2018-03-11 DIAGNOSIS — Z886 Allergy status to analgesic agent status: Secondary | ICD-10-CM | POA: Diagnosis not present

## 2018-03-11 DIAGNOSIS — I4891 Unspecified atrial fibrillation: Secondary | ICD-10-CM | POA: Diagnosis present

## 2018-03-11 LAB — COMPREHENSIVE METABOLIC PANEL
ALK PHOS: 95 U/L (ref 38–126)
ALT: 21 U/L (ref 0–44)
ANION GAP: 8 (ref 5–15)
AST: 30 U/L (ref 15–41)
Albumin: 4.1 g/dL (ref 3.5–5.0)
BUN: 13 mg/dL (ref 6–20)
CALCIUM: 8.6 mg/dL — AB (ref 8.9–10.3)
CO2: 33 mmol/L — AB (ref 22–32)
CREATININE: 1.13 mg/dL (ref 0.61–1.24)
Chloride: 99 mmol/L (ref 98–111)
Glucose, Bld: 97 mg/dL (ref 70–99)
Potassium: 2.5 mmol/L — CL (ref 3.5–5.1)
Sodium: 140 mmol/L (ref 135–145)
Total Bilirubin: 1.1 mg/dL (ref 0.3–1.2)
Total Protein: 7.9 g/dL (ref 6.5–8.1)

## 2018-03-11 LAB — URINALYSIS, ROUTINE W REFLEX MICROSCOPIC
BILIRUBIN URINE: NEGATIVE
Glucose, UA: NEGATIVE mg/dL
HGB URINE DIPSTICK: NEGATIVE
KETONES UR: NEGATIVE mg/dL
Leukocytes, UA: NEGATIVE
NITRITE: NEGATIVE
PROTEIN: NEGATIVE mg/dL
SPECIFIC GRAVITY, URINE: 1.01 (ref 1.005–1.030)
pH: 6 (ref 5.0–8.0)

## 2018-03-11 LAB — GLUCOSE, CAPILLARY
Glucose-Capillary: 95 mg/dL (ref 70–99)
Glucose-Capillary: 92 mg/dL (ref 70–99)

## 2018-03-11 LAB — BRAIN NATRIURETIC PEPTIDE: B NATRIURETIC PEPTIDE 5: 95 pg/mL (ref 0.0–100.0)

## 2018-03-11 LAB — MAGNESIUM: MAGNESIUM: 2.1 mg/dL (ref 1.7–2.4)

## 2018-03-11 LAB — I-STAT TROPONIN, ED: TROPONIN I, POC: 0 ng/mL (ref 0.00–0.08)

## 2018-03-11 MED ORDER — TAMSULOSIN HCL 0.4 MG PO CAPS
0.4000 mg | ORAL_CAPSULE | Freq: Every day | ORAL | Status: DC
  Administered 2018-03-12 – 2018-03-14 (×3): 0.4 mg via ORAL
  Filled 2018-03-11 (×3): qty 1

## 2018-03-11 MED ORDER — ACETAMINOPHEN 325 MG PO TABS: 650.0000 mg | ORAL_TABLET | ORAL | Status: DC | PRN

## 2018-03-11 MED ORDER — SODIUM CHLORIDE 0.9 % IV SOLN
INTRAVENOUS | Status: DC | PRN
  Administered 2018-03-11: 1000 mL via INTRAVENOUS

## 2018-03-11 MED ORDER — SODIUM CHLORIDE 0.9 % IV SOLN
Freq: Once | INTRAVENOUS | Status: AC
  Administered 2018-03-11: 03:00:00 via INTRAVENOUS

## 2018-03-11 MED ORDER — FUROSEMIDE 10 MG/ML IJ SOLN
60.0000 mg | Freq: Once | INTRAMUSCULAR | Status: AC
  Administered 2018-03-11: 60 mg via INTRAVENOUS
  Filled 2018-03-11: qty 6

## 2018-03-11 MED ORDER — ATORVASTATIN CALCIUM 40 MG PO TABS
40.0000 mg | ORAL_TABLET | Freq: Every day | ORAL | Status: DC
  Administered 2018-03-12 – 2018-03-13 (×2): 40 mg via ORAL
  Filled 2018-03-11 (×3): qty 1

## 2018-03-11 MED ORDER — FERROUS GLUCONATE 324 (38 FE) MG PO TABS
324.0000 mg | ORAL_TABLET | Freq: Every day | ORAL | Status: DC
  Administered 2018-03-12 – 2018-03-14 (×3): 324 mg via ORAL
  Filled 2018-03-11 (×5): qty 1

## 2018-03-11 MED ORDER — ASPIRIN 81 MG PO CHEW
81.0000 mg | CHEWABLE_TABLET | Freq: Every day | ORAL | Status: DC
  Administered 2018-03-11 – 2018-03-13 (×3): 81 mg via ORAL
  Filled 2018-03-11 (×4): qty 1

## 2018-03-11 MED ORDER — SODIUM CHLORIDE 0.9 % IV SOLN: 250.0000 mL | INTRAVENOUS | Status: DC | PRN

## 2018-03-11 MED ORDER — POTASSIUM CHLORIDE CRYS ER 20 MEQ PO TBCR
20.0000 meq | EXTENDED_RELEASE_TABLET | Freq: Every day | ORAL | Status: DC
  Administered 2018-03-12 – 2018-03-14 (×3): 20 meq via ORAL
  Filled 2018-03-11 (×3): qty 1

## 2018-03-11 MED ORDER — POTASSIUM CHLORIDE CRYS ER 20 MEQ PO TBCR
40.0000 meq | EXTENDED_RELEASE_TABLET | Freq: Once | ORAL | Status: AC
  Administered 2018-03-11: 40 meq via ORAL
  Filled 2018-03-11: qty 2

## 2018-03-11 MED ORDER — ALBUTEROL SULFATE (2.5 MG/3ML) 0.083% IN NEBU: 2.5000 mg | INHALATION_SOLUTION | Freq: Four times a day (QID) | RESPIRATORY_TRACT | Status: DC | PRN

## 2018-03-11 MED ORDER — SODIUM CHLORIDE 0.9% FLUSH: 3.0000 mL | INTRAVENOUS | Status: DC | PRN

## 2018-03-11 MED ORDER — SPIRONOLACTONE 25 MG PO TABS
50.0000 mg | ORAL_TABLET | Freq: Every day | ORAL | Status: DC
  Administered 2018-03-11 – 2018-03-14 (×4): 50 mg via ORAL
  Filled 2018-03-11 (×4): qty 2

## 2018-03-11 MED ORDER — ONDANSETRON HCL 4 MG/2ML IJ SOLN
4.0000 mg | Freq: Four times a day (QID) | INTRAMUSCULAR | Status: DC | PRN
  Administered 2018-03-11: 4 mg via INTRAVENOUS
  Filled 2018-03-11: qty 2

## 2018-03-11 MED ORDER — LISINOPRIL 5 MG PO TABS
5.0000 mg | ORAL_TABLET | Freq: Every day | ORAL | Status: DC
  Administered 2018-03-12 – 2018-03-13 (×2): 5 mg via ORAL
  Filled 2018-03-11 (×4): qty 1

## 2018-03-11 MED ORDER — POTASSIUM CHLORIDE 10 MEQ/100ML IV SOLN
10.0000 meq | INTRAVENOUS | Status: AC
  Administered 2018-03-11: 10 meq via INTRAVENOUS
  Filled 2018-03-11 (×3): qty 100

## 2018-03-11 MED ORDER — CARVEDILOL 12.5 MG PO TABS
37.5000 mg | ORAL_TABLET | Freq: Two times a day (BID) | ORAL | Status: DC
  Administered 2018-03-11 – 2018-03-14 (×6): 37.5 mg via ORAL
  Filled 2018-03-11 (×6): qty 3

## 2018-03-11 MED ORDER — SODIUM CHLORIDE 0.9% FLUSH
3.0000 mL | Freq: Two times a day (BID) | INTRAVENOUS | Status: DC
  Administered 2018-03-11 – 2018-03-14 (×6): 3 mL via INTRAVENOUS

## 2018-03-11 MED ORDER — POTASSIUM CHLORIDE CRYS ER 20 MEQ PO TBCR
40.0000 meq | EXTENDED_RELEASE_TABLET | ORAL | Status: AC
  Administered 2018-03-11 (×2): 40 meq via ORAL
  Filled 2018-03-11 (×3): qty 2

## 2018-03-11 MED ORDER — RIVAROXABAN 20 MG PO TABS
20.0000 mg | ORAL_TABLET | Freq: Every day | ORAL | Status: DC
  Administered 2018-03-12 – 2018-03-14 (×3): 20 mg via ORAL
  Filled 2018-03-11 (×3): qty 1

## 2018-03-11 MED ORDER — POTASSIUM CHLORIDE 10 MEQ/100ML IV SOLN
10.0000 meq | INTRAVENOUS | Status: AC
  Administered 2018-03-11 (×2): 10 meq via INTRAVENOUS
  Filled 2018-03-11 (×2): qty 100

## 2018-03-11 MED ORDER — PANTOPRAZOLE SODIUM 40 MG PO TBEC
40.0000 mg | DELAYED_RELEASE_TABLET | Freq: Every day | ORAL | Status: DC
  Administered 2018-03-12 – 2018-03-14 (×3): 40 mg via ORAL
  Filled 2018-03-11 (×3): qty 1

## 2018-03-11 MED ORDER — DOCUSATE SODIUM 100 MG PO CAPS
100.0000 mg | ORAL_CAPSULE | Freq: Two times a day (BID) | ORAL | Status: DC
  Administered 2018-03-11 – 2018-03-14 (×6): 100 mg via ORAL
  Filled 2018-03-11 (×6): qty 1

## 2018-03-11 MED ORDER — FUROSEMIDE 10 MG/ML IJ SOLN
40.0000 mg | Freq: Two times a day (BID) | INTRAMUSCULAR | Status: DC
  Administered 2018-03-11 – 2018-03-14 (×6): 40 mg via INTRAVENOUS
  Filled 2018-03-11 (×8): qty 4

## 2018-03-11 MED ORDER — BUDESONIDE 0.25 MG/2ML IN SUSP
0.2500 mg | Freq: Two times a day (BID) | RESPIRATORY_TRACT | Status: DC
  Administered 2018-03-11 – 2018-03-14 (×6): 0.25 mg via RESPIRATORY_TRACT
  Filled 2018-03-11 (×6): qty 2

## 2018-03-11 NOTE — Discharge Instructions (Addendum)
Double your dose of Lasix.  Double your dose of potassium.  Call your doctor today to schedule an appointment for next week for a recheck.

## 2018-03-11 NOTE — Progress Notes (Signed)
At approximately 2100, attempted to provide PO potassium to patient. Patient states that potassium pills make him sick and he cannot take them, midlevel aware, IV potassium ordered. Started patient's IV Potassium and patient complained of IV burning. Rate decreased to 50 ml/hr for comfort. Patient then complained of continued burning and abdominal discomfort. Patient would not elaborate on abdominal discomfort when questioned further and then claimed that the potassium was causing difficulty breathing and not abdominal discomfort. Placed O2 back on patient as he had removed it. Stopped IV potassium. Vitals stable, lungs sounds clear/diminished. Educated patient regarding necessity of replacing potassium and risks associated with refusal. Patient continues to refuse potassium of any sort and states "you gave me too much in one day, I cannot take any more." Midlevel made aware of situation, no new orders placed, will continue to monitor and educate.

## 2018-03-11 NOTE — H&P (Signed)
History and Physical    [name] WUJ:[id number] DOB: 1965/10/16 DOA: 03/10/2018  Referring MD/NP/PA: Dutch Quint, EDP PCP: Pearson Grippe, MD  Patient coming from: Home  Chief Complaint: "Increased fluid in my legs"  HPI: [name] is a 52 y.o. male well-known to [place] from multiple hospitalizations for acute on chronic combined heart failure mainly due to medication and dietary noncompliance.  Per most recent echo, his EF has now recovered.  His main complaint is a progressive 1 week history of increased lower extremity edema, although he also complains of extreme weakness and shortness of breath.  He denies chest pain.  Due to clear chest x-ray and low BNP, EDP try to discharge patient home but patient was adamant that he needed diuresis and his we are called to admit for further evaluation and management.  Past Medical/Surgical History: Past Medical History:  Diagnosis Date  . Allergic rhinitis   . Asthma   . Atrial fibrillation (HCC)   . CHF (congestive heart failure) (HCC)   . Essential hypertension   . Gastroesophageal reflux disease   . Heart attack (HCC)   . History of cardiac catheterization    Normal coronaries December 2016  . Noncompliance    Major problem leading to declining health and recurrent hospitalization  . Nonischemic cardiomyopathy (HCC)    LVEF 20-25%  . On home O2    3L N/C  . OSA (obstructive sleep apnea)   . Osteoarthritis   . Peptic ulcer disease     Past Surgical History:  Procedure Laterality Date  . CARDIAC CATHETERIZATION N/A 06/14/2015   Procedure: Right/Left Heart Cath and Coronary Angiography;  Surgeon: Runell Gess, MD;  Location: [facility] INVASIVE CV LAB;  Service: Cardiovascular;  Laterality: N/A;    Social History:  reports that he quit smoking about 10 years ago. His smoking use included cigarettes. He started smoking about 29 years ago. He has a 10.00 pack-year smoking history. He has never used smokeless tobacco. He reports that he  does not drink alcohol or use drugs.  Allergies: Allergies  Allergen Reactions  . Banana Shortness Of Breath  . Bee Venom Shortness Of Breath, Swelling and Other (See Comments)    Reaction:  Facial swelling  . Food Shortness Of Breath, Rash and Other (See Comments)    Pt states that he is allergic to strawberries.    . Aspirin Other (See Comments)    Reaction:  GI upset   . Metolazone Other (See Comments)    Pt states that he stopped taking this med due to heart attack like symptoms.   . Oatmeal Nausea And Vomiting  . Orange Juice [Orange Oil] Nausea And Vomiting    All acidic products make him nauseous and upset stomach  . Torsemide Swelling and Other (See Comments)    Reaction:  Swelling of feet/legs   . Diltiazem Palpitations  . Hydralazine Palpitations  . Lipitor [Atorvastatin] Other (See Comments)    Reaction:  Nose bleeds     Family History:  Family History  Problem Relation Age of Onset  . Stroke Father   . Heart attack Father   . Aneurysm Mother        Cerebral aneurysm  . Hypertension Sister   . Colon cancer Neg Hx   . Inflammatory bowel disease Neg Hx   . Liver disease Neg Hx     Prior to Admission medications   Medication Sig Start Date End Date Taking? Authorizing Provider  albuterol (PROVENTIL) (2.5  MG/3ML) 0.083% nebulizer solution Take 2.5 mg by nebulization every 6 (six) hours as needed for wheezing or shortness of breath.    [provider]  aspirin 81 MG chewable tablet Chew 1 tablet (81 mg total) by mouth daily. 04/13/17   Erick Blinks, MD  atorvastatin (LIPITOR) 40 MG tablet Take 1 tablet (40 mg total) by mouth daily at 6 PM. 06/25/16   Kathlen Mody, MD  beclomethasone (QVAR) 80 MCG/ACT inhaler Inhale 2 puffs into the lungs 2 (two) times daily.     [provider]  bumetanide (BUMEX) 2 MG tablet Take 2 mg by mouth 2 (two) times daily.    [provider]  carvedilol (COREG) 25 MG tablet Take 1.5 tablets (37.5 mg total) by  mouth 2 (two) times daily with a meal. 10/07/17   [name], MD  docusate sodium (COLACE) 100 MG capsule Take 1 capsule (100 mg total) by mouth 2 (two) times daily. Patient taking differently: Take 100 mg by mouth 2 (two) times daily as needed for mild constipation.  06/25/16   Kathlen Mody, MD  ferrous gluconate (FERGON) 324 MG tablet Take 1 tablet (324 mg total) by mouth 2 (two) times daily with a meal. Patient taking differently: Take 324 mg by mouth daily with breakfast.  06/25/16   Kathlen Mody, MD  lisinopril (PRINIVIL,ZESTRIL) 5 MG tablet Take 1 tablet (5 mg total) by mouth daily. 10/07/17   [name], MD  OXYGEN Inhale 3.5 L into the lungs daily.    [provider]  pantoprazole (PROTONIX) 40 MG tablet Take 1 tablet (40 mg total) by mouth daily at 6 (six) AM. Patient taking differently: Take 40 mg by mouth every evening.  06/26/16   Kathlen Mody, MD  potassium chloride (K-DUR,KLOR-CON) 20 MEQ tablet Take 1 tablet (20 mEq total) by mouth daily. 02/20/17   Houston Siren, MD  rivaroxaban (XARELTO) 20 MG TABS tablet Take 1 tablet (20 mg total) by mouth daily with breakfast. Patient taking differently: Take 20 mg by mouth daily with supper.  06/26/16   Kathlen Mody, MD  spironolactone (ALDACTONE) 50 MG tablet Take 50 mg daily by mouth. For fluid 03/18/17   [provider]  tamsulosin (FLOMAX) 0.4 MG CAPS capsule Take 1 capsule (0.4 mg total) by mouth daily. 06/25/16   Kathlen Mody, MD  potassium chloride 20 MEQ TBCR Take 20 mEq by mouth 2 (two) times daily. 09/03/15 09/03/15  Ward, Layla Maw, DO    Review of Systems:  Constitutional: Denies fever, chills, diaphoresis, appetite change and fatigue.  HEENT: Denies photophobia, eye pain, redness, hearing loss, ear pain, congestion, sore throat, rhinorrhea, sneezing, mouth sores, trouble swallowing, neck pain, neck stiffness and tinnitus.   Respiratory: Denies cough, chest tightness,  and wheezing.   Cardiovascular: Denies  chest pain, palpitations and leg swelling.  Gastrointestinal: Denies nausea, vomiting, abdominal pain, diarrhea, constipation, blood in stool and abdominal distention.  Genitourinary: Denies dysuria, urgency, frequency, hematuria, flank pain and difficulty urinating.  Endocrine: Denies: hot or cold intolerance, sweats, changes in hair or nails, polyuria, polydipsia. Musculoskeletal: Denies myalgias, back pain, joint swelling, arthralgias and gait problem.  Skin: Denies pallor, rash and wound.  Neurological: Denies dizziness, seizures, syncope, weakness, light-headedness, numbness and headaches.  Hematological: Denies adenopathy. Easy bruising, personal or family bleeding history  Psychiatric/Behavioral: Denies suicidal ideation, mood changes, confusion, nervousness, sleep disturbance and agitation    Physical Exam: Vitals:   03/11/18 0818 03/11/18 0909 03/11/18 1153 03/11/18 1328  BP: 132/71  123/89  129/86  Pulse: 74 80  85  Resp: 19 18  20   Temp:  98.5 F (36.9 C)  98.4 F (36.9 C)  TempSrc:  Oral  Oral  SpO2: 100% 100%  (!) 73%  Weight:   (!) 157.9 kg   Height:         Constitutional: NAD, calm, comfortable Eyes: PERRL, lids and conjunctivae normal ENMT: Mucous membranes are moist. Posterior pharynx clear of any exudate or lesions.Normal dentition.  Neck: normal, supple, no masses, no thyromegaly Respiratory: clear to auscultation bilaterally, no wheezing, no crackles. Normal respiratory effort. No accessory muscle use.  Cardiovascular: Regular rate and rhythm, no murmurs / rubs / gallops.  2+ pitting edema bilaterally, patient is also noted to have significant nonpitting edema.  2+ pedal pulses. No carotid bruits.  Abdomen: no tenderness, no masses palpated. No hepatosplenomegaly. Bowel sounds positive.  Musculoskeletal: no clubbing / cyanosis. No joint deformity upper and lower extremities. Good ROM, no contractures. Normal muscle tone.  Skin: no rashes, lesions, ulcers. No  induration Neurologic: CN 2-12 grossly intact. Sensation intact, DTR normal. Strength 5/5 in all 4.  Psychiatric: Normal judgment and insight. Alert and oriented x 3. Normal mood.    Labs on Admission: I have personally reviewed the following labs and imaging studies  CBC: Recent Labs  Lab 03/10/18 2335  WBC 5.4  NEUTROABS 3.5  HGB 14.2  HCT 42.4  MCV 88.1  PLT 201   Basic Metabolic Panel: Recent Labs  Lab 03/10/18 2335  NA 140  K 2.5*  CL 99  CO2 33*  GLUCOSE 97  BUN 13  CREATININE 1.13  CALCIUM 8.6*  MG 2.1   GFR: Estimated Creatinine Clearance: 122.7 mL/min (by C-G formula based on SCr of 1.13 mg/dL). Liver Function Tests: Recent Labs  Lab 03/10/18 2335  AST 30  ALT 21  ALKPHOS 95  BILITOT 1.1  PROT 7.9  ALBUMIN 4.1   No results for input(s): LIPASE, AMYLASE in the last 168 hours. No results for input(s): AMMONIA in the last 168 hours. Coagulation Profile: No results for input(s): INR, PROTIME in the last 168 hours. Cardiac Enzymes: No results for input(s): CKTOTAL, CKMB, CKMBINDEX, TROPONINI in the last 168 hours. BNP (last 3 results) No results for input(s): PROBNP in the last 8760 hours. HbA1C: No results for input(s): HGBA1C in the last 72 hours. CBG: No results for input(s): GLUCAP in the last 168 hours. Lipid Profile: No results for input(s): CHOL, HDL, LDLCALC, TRIG, CHOLHDL, LDLDIRECT in the last 72 hours. Thyroid Function Tests: No results for input(s): TSH, T4TOTAL, FREET4, T3FREE, THYROIDAB in the last 72 hours. Anemia Panel: No results for input(s): VITAMINB12, FOLATE, FERRITIN, TIBC, IRON, RETICCTPCT in the last 72 hours. Urine analysis:    Component Value Date/Time   COLORURINE YELLOW 03/11/2018 0040   APPEARANCEUR CLEAR 03/11/2018 0040   LABSPEC 1.010 03/11/2018 0040   PHURINE 6.0 03/11/2018 0040   GLUCOSEU NEGATIVE 03/11/2018 0040   GLUCOSEU NEG mg/dL 75/64/3329 5188   HGBUR NEGATIVE 03/11/2018 0040   BILIRUBINUR NEGATIVE  03/11/2018 0040   KETONESUR NEGATIVE 03/11/2018 0040   PROTEINUR NEGATIVE 03/11/2018 0040   UROBILINOGEN 0.2 08/08/2014 1445   NITRITE NEGATIVE 03/11/2018 0040   LEUKOCYTESUR NEGATIVE 03/11/2018 0040   Sepsis Labs: @LABRCNTIP (procalcitonin:4,lacticidven:4) )No results found for this or any previous visit (from the past 240 hour(s)).   Radiological Exams on Admission: Dg Chest 2 View  Result Date: 03/11/2018 CLINICAL DATA:  Weakness and dyspnea x1 week. EXAM: CHEST -  2 VIEW COMPARISON:  None. FINDINGS: Stable cardiomegaly and mediastinal contours. Mild to moderate central pulmonary vascular congestion. No acute osseous abnormality. IMPRESSION: Cardiomegaly with stigmata of mild CHF. Electronically Signed   By: Tollie Eth M.D.   On: 03/11/2018 00:22    EKG: Independently reviewed.  Atrial fibrillation at a rate of 90, no acute ischemic changes.  Assessment/Plan Principal Problem:   Acute on chronic diastolic CHF (congestive heart failure) (HCC) Active Problems:   Hyperlipidemia   OBESITY, MORBID   Essential hypertension   Obesity hypoventilation syndrome (HCC)   GERD (gastroesophageal reflux disease)   Pulmonary hypertension (HCC)   OSA (obstructive sleep apnea)   Noncompliance with diet and medication regimen   Morbid obesity due to excess calories (HCC)   Chronic anticoagulation-Xarelto   CKD (chronic kidney disease), stage II    Acute on chronic diastolic heart failure -Per last echo in November 2018 ejection fraction had recovered to 55%. -Continue ACE inhibitor, Coreg. -Place on IV Lasix 40 mg IV twice daily, daily weights, strict intake and output, strive for negative fluid balance. -This exacerbation is due to dietary medication noncompliance.  Hypokalemia -We will replete orally. -Magnesium is normal at 2.1.  Atrial fibrillation -Rate controlled on Coreg, chronically anticoagulated on Xarelto.  OSA/OHS -Refuses CPAP.    Morbid obesity -BMI of 47.2,  counseled on lifestyle modifications.  Chronic kidney disease stage II -Creatinine remains at baseline of around 1.1-1.3.  Hypertension -Currently well controlled, continue current medications.  Hyperlipidemia -Continue statin.   DVT prophylaxis: Xarelto Code Status: Full code Family Communication: Patient only Disposition Plan: Would anticipate discharge home once adequately diuresed and potassium replaced, approximately 48 to 72 hours Consults called: None Admission status: Admit - It is my clinical opinion that admission to INPATIENT is reasonable and necessary because of the expectation that this patient will require hospital care that crosses at least 2 midnights to treat this condition based on the medical complexity of the problems presented.  Given the aforementioned information, the predictability of an adverse outcome is felt to be significant.     Time Spent: 85 minutes  Estela Philip Aspen MD [facility] Pager [phone]  If 7PM-7AM, please contact night-coverage [email or web address] Password [email or web address]  03/11/2018, 2:08 PM

## 2018-03-11 NOTE — Care Management Important Message (Signed)
Important Message  Patient Details  Name: [name] MRN: [id number] Date of Birth: 08-20-1965   Medicare Important Message Given:  Yes    Renie Ora 03/11/2018, 10:39 AM

## 2018-03-11 NOTE — ED Notes (Signed)
Date and time results received: 03/11/18 00:25  Test: Potassium Critical Value: 2.5  Name of Provider Notified:  Dr.Pollina  Orders Received? Or Actions Taken?: Physician notified

## 2018-03-12 DIAGNOSIS — I10 Essential (primary) hypertension: Secondary | ICD-10-CM

## 2018-03-12 DIAGNOSIS — N182 Chronic kidney disease, stage 2 (mild): Secondary | ICD-10-CM

## 2018-03-12 LAB — BASIC METABOLIC PANEL
ANION GAP: 8 (ref 5–15)
BUN: 12 mg/dL (ref 6–20)
CALCIUM: 8.5 mg/dL — AB (ref 8.9–10.3)
CO2: 30 mmol/L (ref 22–32)
CREATININE: 1.18 mg/dL (ref 0.61–1.24)
Chloride: 106 mmol/L (ref 98–111)
Glucose, Bld: 91 mg/dL (ref 70–99)
Potassium: 3.1 mmol/L — ABNORMAL LOW (ref 3.5–5.1)
SODIUM: 144 mmol/L (ref 135–145)

## 2018-03-12 LAB — HIV ANTIBODY (ROUTINE TESTING W REFLEX): HIV SCREEN 4TH GENERATION: NONREACTIVE

## 2018-03-12 IMAGING — DX DG CHEST 2V
2 series · 2 of 2 positions shown · non-contrast
Comparison: 06/22/2016

CLINICAL DATA: Shortness of breath and swelling in the legs.
History of CHF on Lasix.

EXAM:
CHEST  2 VIEW

[chest lat]
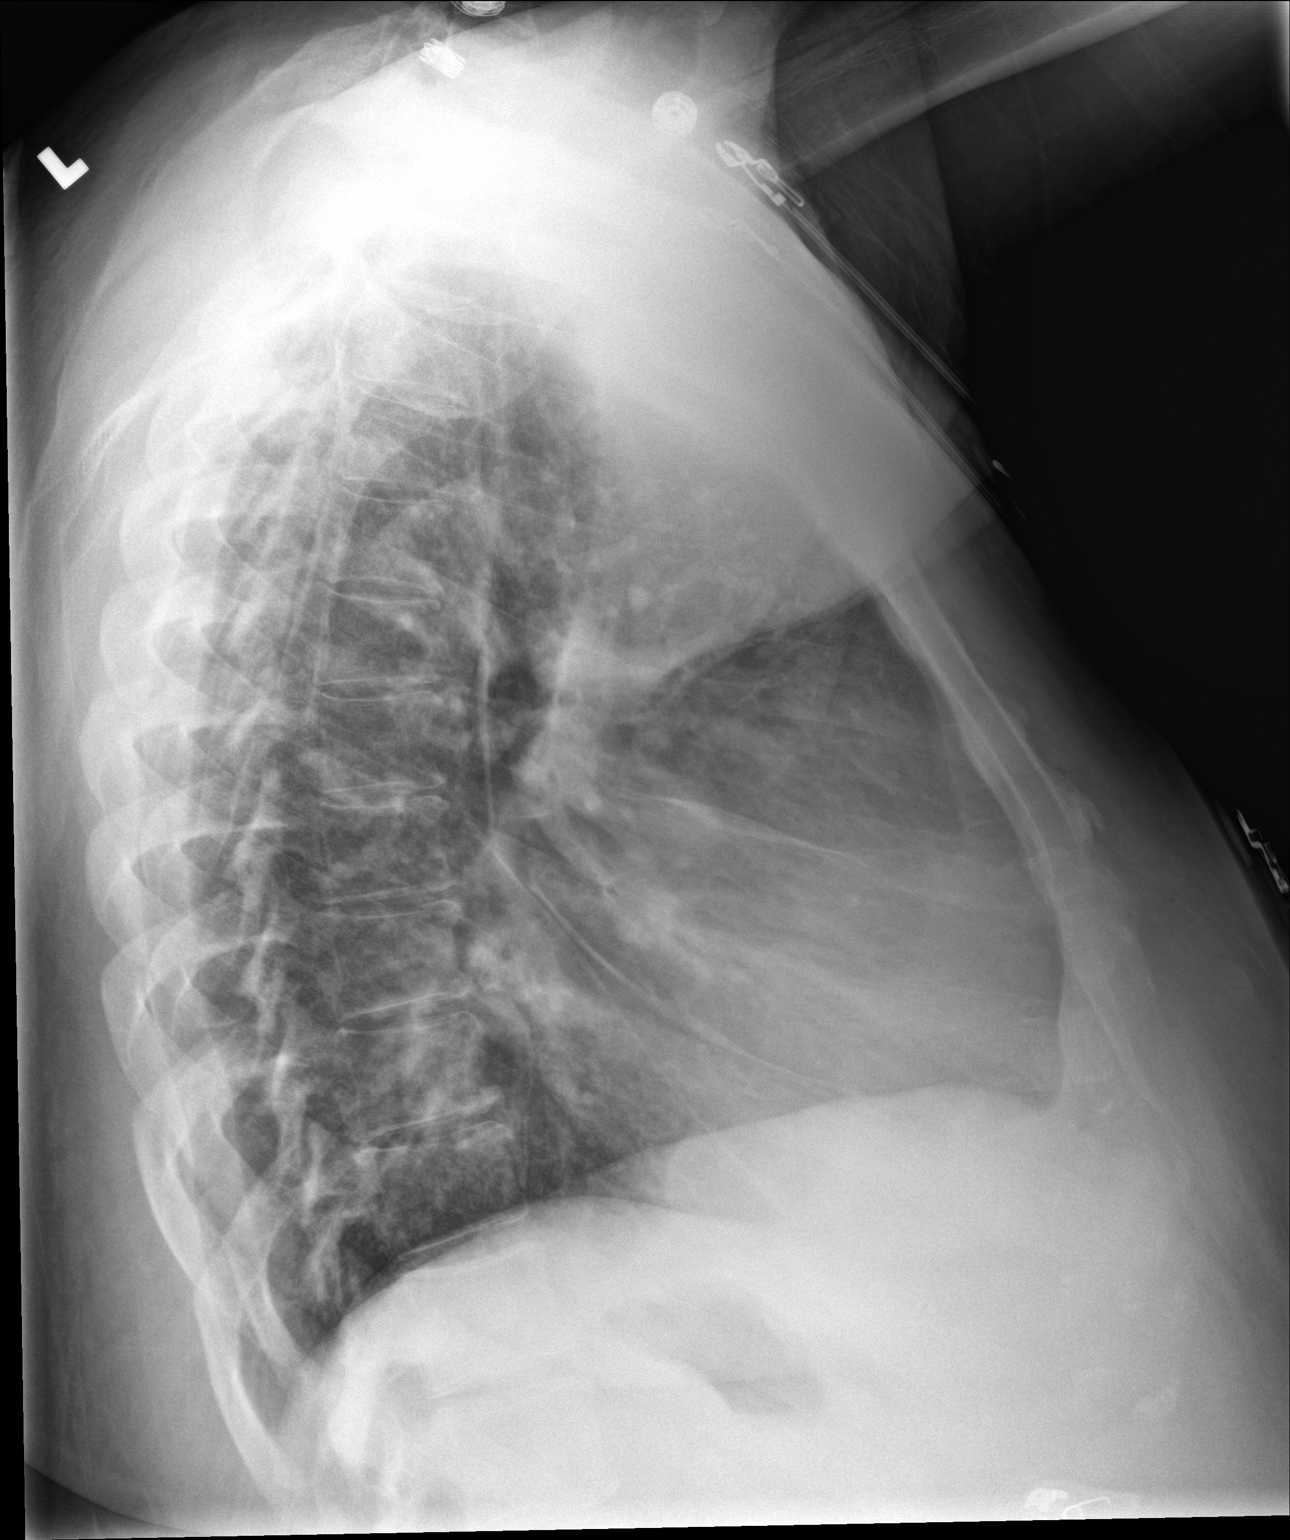

[chest pa]
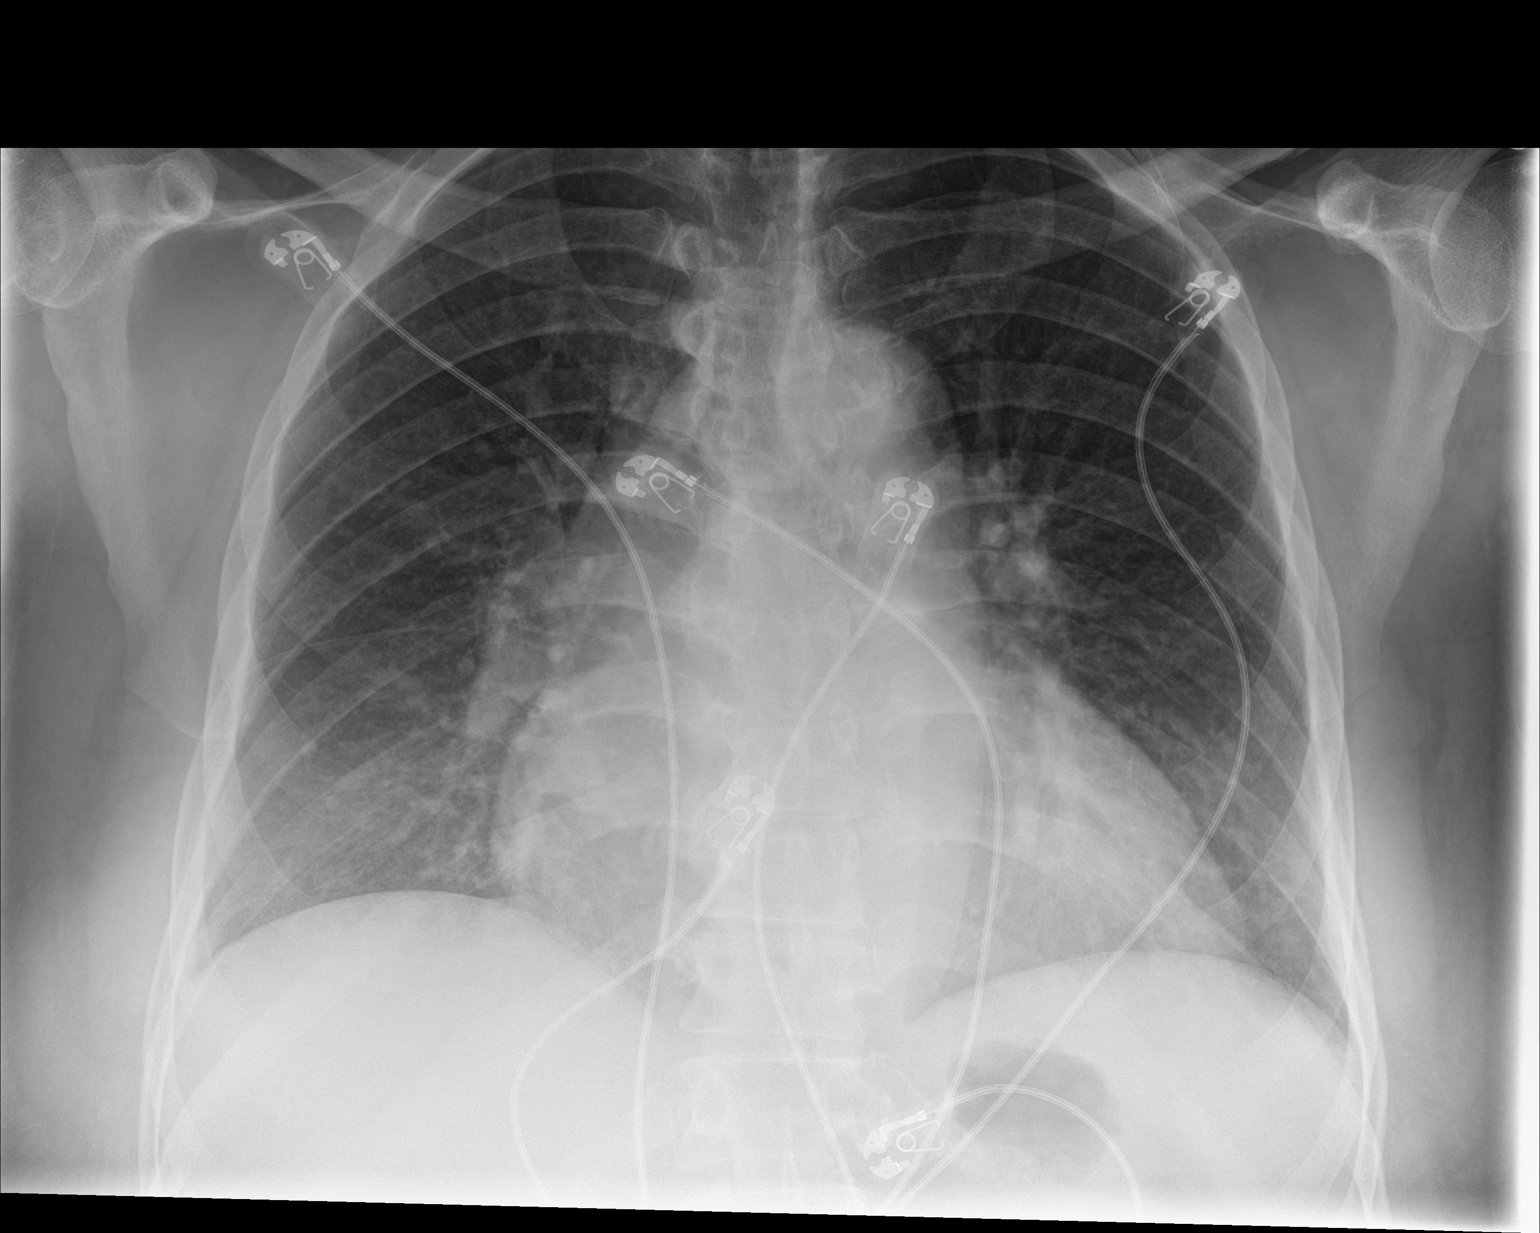

[2 of 2 positions shown; findings below may reference images not displayed]

FINDINGS: Cardiac enlargement. Mild fluid in the fissures. No pulmonary
vascular congestion. No significant edema. No focal airspace disease
or consolidation in the lungs. No blunting of costophrenic angles.
No pneumothorax. Mild degenerative changes in the spine.
IMPRESSION: Cardiac enlargement with fluid in the fissures. No evidence of
significant vascular congestion.

## 2018-03-12 NOTE — Progress Notes (Signed)
PROGRESS NOTE    [name]  AOZ:[id number] DOB: Mar 02, 1966 DOA: 03/10/2018 PCP: Pearson Grippe, MD     Brief Narrative:  Patient is a 52 year old man well-known to [place] for frequent hospitalizations due to acute on chronic diastolic heart failure primarily due to medication and dietary noncompliance.  He is to have systolic dysfunction as well but his EF has recovered as per echocardiogram in November 2018.  Presented with a 1 week progressive history of increased lower extremity edema, weakness and shortness of breath.   Assessment & Plan:   Principal Problem:   Acute on chronic diastolic CHF (congestive heart failure) (HCC) Active Problems:   Hyperlipidemia   OBESITY, MORBID   Essential hypertension   Obesity hypoventilation syndrome (HCC)   GERD (gastroesophageal reflux disease)   Pulmonary hypertension (HCC)   OSA (obstructive sleep apnea)   Noncompliance with diet and medication regimen   Morbid obesity due to excess calories (HCC)   Chronic anticoagulation-Xarelto   CKD (chronic kidney disease), stage II   Pressure injury of skin   Acute on chronic diastolic heart failure -Last echo from November 2018 showed an ejection fraction of 55% with akinesis of the basal inferior myocardium, not technically sufficient to evaluate LV diastolic dysfunction due to atrial fibrillation. -Urine output was 2250 cc overnight. -Of note he refuses potassium supplementation overnight, have discussed this with him and he has agreed to take. -Will continue current Lasix dose of 40 mg IV twice daily, he still demonstrates signs of volume overload clinically with some mild lung crackles on auscultation as well as significant lower extremity edema. -Continue lisinopril, Coreg.  Hypokalemia -Continue to replace, as above refused supplementation overnight. -Magnesium is normal at 2.1.  Atrial fibrillation -He is rate controlled on Coreg, chronically anticoagulated on Xarelto.  Morbid  obesity -BMI of 47.2, counseled on lifestyle modifications.  OSA/OHS -Refuses CPAP  Stage II chronic kidney disease -Creatinine remains at his baseline of around 1.1-1.3.  Hypertension -Well-controlled, continue current medications.  Hyperlipidemia -Continue statin.   DVT prophylaxis: Fully anticoagulated on Xarelto ` Code Status: Full code Family Communication: Patient only Disposition Plan: Home when medically ready anticipate 48 hours  Consultants:   None  Procedures:   None  Antimicrobials:  Anti-infectives (From admission, onward)   None       Subjective: Lying in bed, no complaints, explains why he did not want to take his oral potassium (makes him nauseous) or his IV potassium (was burning)  Objective: Vitals:   03/12/18 0520 03/12/18 0600 03/12/18 0656 03/12/18 0724  BP: 132/67     Pulse: 80     Resp: 20     Temp: 98.4 F (36.9 C)     TempSrc: Oral     SpO2: 96%   95%  Weight:  (!) 171.2 kg (!) 172.2 kg   Height:        Intake/Output Summary (Last 24 hours) at 03/12/2018 1215 Last data filed at 03/12/2018 1001 Gross per 24 hour  Intake 619.71 ml  Output 1500 ml  Net -880.29 ml   Filed Weights   03/11/18 1153 03/12/18 0600 03/12/18 0656  Weight: (!) 157.9 kg (!) 171.2 kg (!) 172.2 kg    Examination:  General exam: Alert, awake, oriented x 3, obese Respiratory system: Mild bibasilar crackles. Respiratory effort normal. Cardiovascular system:RRR. No murmurs, rubs, gallops. Gastrointestinal system: Abdomen is nondistended, soft and nontender. No organomegaly or masses felt. Normal bowel sounds heard. Central nervous system: Alert and oriented. No focal neurological  deficits. Extremities: 2+ pitting edema bilaterally, positive pedal pulses Skin: No rashes, lesions or ulcers Psychiatry: Judgement and insight appear normal. Mood & affect appropriate.     Data Reviewed: I have personally reviewed following labs and imaging  studies  CBC: Recent Labs  Lab 03/10/18 2335  WBC 5.4  NEUTROABS 3.5  HGB 14.2  HCT 42.4  MCV 88.1  PLT 201   Basic Metabolic Panel: Recent Labs  Lab 03/10/18 2335 03/12/18 0640  NA 140 144  K 2.5* 3.1*  CL 99 106  CO2 33* 30  GLUCOSE 97 91  BUN 13 12  CREATININE 1.13 1.18  CALCIUM 8.6* 8.5*  MG 2.1  --    GFR: Estimated Creatinine Clearance: 123.6 mL/min (by C-G formula based on SCr of 1.18 mg/dL). Liver Function Tests: Recent Labs  Lab 03/10/18 2335  AST 30  ALT 21  ALKPHOS 95  BILITOT 1.1  PROT 7.9  ALBUMIN 4.1   No results for input(s): LIPASE, AMYLASE in the last 168 hours. No results for input(s): AMMONIA in the last 168 hours. Coagulation Profile: No results for input(s): INR, PROTIME in the last 168 hours. Cardiac Enzymes: No results for input(s): CKTOTAL, CKMB, CKMBINDEX, TROPONINI in the last 168 hours. BNP (last 3 results) No results for input(s): PROBNP in the last 8760 hours. HbA1C: No results for input(s): HGBA1C in the last 72 hours. CBG: Recent Labs  Lab 03/11/18 1526 03/11/18 2202  GLUCAP 95 92   Lipid Profile: No results for input(s): CHOL, HDL, LDLCALC, TRIG, CHOLHDL, LDLDIRECT in the last 72 hours. Thyroid Function Tests: No results for input(s): TSH, T4TOTAL, FREET4, T3FREE, THYROIDAB in the last 72 hours. Anemia Panel: No results for input(s): VITAMINB12, FOLATE, FERRITIN, TIBC, IRON, RETICCTPCT in the last 72 hours. Urine analysis:    Component Value Date/Time   COLORURINE YELLOW 03/11/2018 0040   APPEARANCEUR CLEAR 03/11/2018 0040   LABSPEC 1.010 03/11/2018 0040   PHURINE 6.0 03/11/2018 0040   GLUCOSEU NEGATIVE 03/11/2018 0040   GLUCOSEU NEG mg/dL 75/17/0017 4944   HGBUR NEGATIVE 03/11/2018 0040   BILIRUBINUR NEGATIVE 03/11/2018 0040   KETONESUR NEGATIVE 03/11/2018 0040   PROTEINUR NEGATIVE 03/11/2018 0040   UROBILINOGEN 0.2 08/08/2014 1445   NITRITE NEGATIVE 03/11/2018 0040   LEUKOCYTESUR NEGATIVE 03/11/2018 0040    Sepsis Labs: @LABRCNTIP (procalcitonin:4,lacticidven:4)  )No results found for this or any previous visit (from the past 240 hour(s)).       Radiology Studies: Dg Chest 2 View  Result Date: 03/11/2018 CLINICAL DATA:  Weakness and dyspnea x1 week. EXAM: CHEST - 2 VIEW COMPARISON:  None. FINDINGS: Stable cardiomegaly and mediastinal contours. Mild to moderate central pulmonary vascular congestion. No acute osseous abnormality. IMPRESSION: Cardiomegaly with stigmata of mild CHF. Electronically Signed   By: Tollie Eth M.D.   On: 03/11/2018 00:22        Scheduled Meds: . aspirin  81 mg Oral Daily  . atorvastatin  40 mg Oral q1800  . budesonide (PULMICORT) nebulizer solution  0.25 mg Nebulization BID  . carvedilol  37.5 mg Oral BID WC  . docusate sodium  100 mg Oral BID  . ferrous gluconate  324 mg Oral Q breakfast  . furosemide  40 mg Intravenous Q12H  . lisinopril  5 mg Oral Daily  . pantoprazole  40 mg Oral Q0600  . potassium chloride SA  20 mEq Oral Daily  . rivaroxaban  20 mg Oral Q breakfast  . sodium chloride flush  3 mL Intravenous Q12H  .  spironolactone  50 mg Oral Daily  . tamsulosin  0.4 mg Oral Daily   Continuous Infusions: . sodium chloride       LOS: 1 day    Time spent: 35 minutes. Greater than 50% of this time was spent in direct contact with the patient, coordinating care and discussing relevant ongoing clinical issues, including plans for continued IV diuretic therapy, need to take potassium supplementation.     Chaya Jan, MD [facility] Pager [phone]  If 7PM-7AM, please contact night-coverage [email or web address] Password [facility] 03/12/2018, 12:15 PM

## 2018-03-12 NOTE — Progress Notes (Signed)
Patient's potassium 2.5 on 9/20, patient continues to refuse IV and PO potassium. Dr. Sharl Ma notified of potassium replacement refusal and potassium level as lasix is due this A.M. Verbal order to hold Lasix 40 mg 0600 dose.

## 2018-03-13 LAB — BASIC METABOLIC PANEL
Anion gap: 10 (ref 5–15)
BUN: 16 mg/dL (ref 6–20)
CALCIUM: 8.6 mg/dL — AB (ref 8.9–10.3)
CO2: 27 mmol/L (ref 22–32)
Chloride: 105 mmol/L (ref 98–111)
Creatinine, Ser: 1.19 mg/dL (ref 0.61–1.24)
GFR calc Af Amer: >60 mL/min (ref >60)
GLUCOSE: 92 mg/dL (ref 70–99)
Potassium: 3 mmol/L — ABNORMAL LOW (ref 3.5–5.1)
Sodium: 142 mmol/L (ref 135–145)

## 2018-03-13 MED ORDER — POTASSIUM CHLORIDE CRYS ER 20 MEQ PO TBCR
40.0000 meq | EXTENDED_RELEASE_TABLET | ORAL | Status: AC
  Administered 2018-03-13 (×2): 40 meq via ORAL
  Filled 2018-03-13 (×2): qty 2

## 2018-03-13 NOTE — Progress Notes (Signed)
PROGRESS NOTE    [name]  WJX:[id number] DOB: May 08, 1966 DOA: 03/10/2018 PCP: Pearson Grippe, MD     Brief Narrative:  Patient is a 52 year old man well-known to [place] for frequent hospitalizations due to acute on chronic diastolic heart failure primarily due to medication and dietary noncompliance.  He used to have systolic dysfunction as well but his EF has recovered as per echocardiogram in November 2018.  Presented with a 1 week progressive history of increased lower extremity edema, weakness and shortness of breath.   Assessment & Plan:   Principal Problem:   Acute on chronic diastolic CHF (congestive heart failure) (HCC) Active Problems:   Hyperlipidemia   OBESITY, MORBID   Essential hypertension   Obesity hypoventilation syndrome (HCC)   GERD (gastroesophageal reflux disease)   Pulmonary hypertension (HCC)   OSA (obstructive sleep apnea)   Noncompliance with diet and medication regimen   Morbid obesity due to excess calories (HCC)   Chronic anticoagulation-Xarelto   CKD (chronic kidney disease), stage II   Pressure injury of skin   Acute on chronic diastolic heart failure -Last echo from November 2018 showed an ejection fraction of 55% with akinesis of the basal inferior myocardium, not technically sufficient to evaluate LV diastolic dysfunction due to atrial fibrillation. -Urine output was 2000 cc overnight. -Will continue current Lasix dose of 40 mg IV twice daily, he still demonstrates signs of volume overload clinically with some mild lung crackles on auscultation as well as significant lower extremity edema. -Continue lisinopril, Coreg.  Hypokalemia -Continue to replace, he had refused supplementation yesterday, agrees to take it today. -Magnesium is normal at 2.1.  Atrial fibrillation -He is rate controlled on Coreg, chronically anticoagulated on Xarelto.  Morbid obesity -BMI of 47.2, counseled on lifestyle modifications.  OSA/OHS -Refuses CPAP  Stage II  chronic kidney disease -Creatinine remains at his baseline of around 1.1-1.3.  Hypertension -Well-controlled, continue current medications.  Hyperlipidemia -Continue statin.   DVT prophylaxis: Fully anticoagulated on Xarelto  Code Status: Full code Family Communication: Patient only Disposition Plan: Home when medically ready anticipate 24-48 hours  Consultants:   None  Procedures:   None  Antimicrobials:  Anti-infectives (From admission, onward)   None       Subjective: In bed, no complaints, states he is urinating a lot and his legs are less swollen.  Objective: Vitals:   03/12/18 2132 03/13/18 0500 03/13/18 0704 03/13/18 0734  BP: 119/64  97/71   Pulse: (!) 50  79   Resp: 18  18   Temp: 98.3 F (36.8 C)  98.5 F (36.9 C)   TempSrc: Oral  Oral   SpO2: 100%  96% 95%  Weight:  (!) 172.8 kg    Height:        Intake/Output Summary (Last 24 hours) at 03/13/2018 1130 Last data filed at 03/13/2018 2130 Gross per 24 hour  Intake 820 ml  Output 2300 ml  Net -1480 ml   Filed Weights   03/12/18 0600 03/12/18 0656 03/13/18 0500  Weight: (!) 171.2 kg (!) 172.2 kg (!) 172.8 kg    Examination:  General exam: Alert, awake, oriented x 3 Respiratory system: Clear to auscultation. Respiratory effort normal. Cardiovascular system: Irregular rhythm, regular rate. No murmurs, rubs, gallops. Gastrointestinal system: Abdomen is obese, nondistended, soft and nontender. No organomegaly or masses felt. Normal bowel sounds heard. Central nervous system: Alert and oriented. No focal neurological deficits. Extremities: 2+ pitting edema bilaterally, chronic venous insufficiency skin changes, lymphedema, positive pedal pulses Skin:  No rashes, lesions or ulcers Psychiatry: Judgement and insight appear normal. Mood & affect appropriate.      Data Reviewed: I have personally reviewed following labs and imaging studies  CBC: Recent Labs  Lab 03/10/18 2335  WBC 5.4    NEUTROABS 3.5  HGB 14.2  HCT 42.4  MCV 88.1  PLT 201   Basic Metabolic Panel: Recent Labs  Lab 03/10/18 2335 03/12/18 0640 03/13/18 0600  NA 140 144 142  K 2.5* 3.1* 3.0*  CL 99 106 105  CO2 33* 30 27  GLUCOSE 97 91 92  BUN 13 12 16   CREATININE 1.13 1.18 1.19  CALCIUM 8.6* 8.5* 8.6*  MG 2.1  --   --    GFR: Estimated Creatinine Clearance: 122.9 mL/min (by C-G formula based on SCr of 1.19 mg/dL). Liver Function Tests: Recent Labs  Lab 03/10/18 2335  AST 30  ALT 21  ALKPHOS 95  BILITOT 1.1  PROT 7.9  ALBUMIN 4.1   No results for input(s): LIPASE, AMYLASE in the last 168 hours. No results for input(s): AMMONIA in the last 168 hours. Coagulation Profile: No results for input(s): INR, PROTIME in the last 168 hours. Cardiac Enzymes: No results for input(s): CKTOTAL, CKMB, CKMBINDEX, TROPONINI in the last 168 hours. BNP (last 3 results) No results for input(s): PROBNP in the last 8760 hours. HbA1C: No results for input(s): HGBA1C in the last 72 hours. CBG: Recent Labs  Lab 03/11/18 1526 03/11/18 2202  GLUCAP 95 92   Lipid Profile: No results for input(s): CHOL, HDL, LDLCALC, TRIG, CHOLHDL, LDLDIRECT in the last 72 hours. Thyroid Function Tests: No results for input(s): TSH, T4TOTAL, FREET4, T3FREE, THYROIDAB in the last 72 hours. Anemia Panel: No results for input(s): VITAMINB12, FOLATE, FERRITIN, TIBC, IRON, RETICCTPCT in the last 72 hours. Urine analysis:    Component Value Date/Time   COLORURINE YELLOW 03/11/2018 0040   APPEARANCEUR CLEAR 03/11/2018 0040   LABSPEC 1.010 03/11/2018 0040   PHURINE 6.0 03/11/2018 0040   GLUCOSEU NEGATIVE 03/11/2018 0040   GLUCOSEU NEG mg/dL 16/03/9603 5409   HGBUR NEGATIVE 03/11/2018 0040   BILIRUBINUR NEGATIVE 03/11/2018 0040   KETONESUR NEGATIVE 03/11/2018 0040   PROTEINUR NEGATIVE 03/11/2018 0040   UROBILINOGEN 0.2 08/08/2014 1445   NITRITE NEGATIVE 03/11/2018 0040   LEUKOCYTESUR NEGATIVE 03/11/2018 0040    Sepsis Labs: @LABRCNTIP (procalcitonin:4,lacticidven:4)  )No results found for this or any previous visit (from the past 240 hour(s)).       Radiology Studies: No results found.      Scheduled Meds: . aspirin  81 mg Oral Daily  . atorvastatin  40 mg Oral q1800  . budesonide (PULMICORT) nebulizer solution  0.25 mg Nebulization BID  . carvedilol  37.5 mg Oral BID WC  . docusate sodium  100 mg Oral BID  . ferrous gluconate  324 mg Oral Q breakfast  . furosemide  40 mg Intravenous Q12H  . lisinopril  5 mg Oral Daily  . pantoprazole  40 mg Oral Q0600  . potassium chloride SA  20 mEq Oral Daily  . potassium chloride  40 mEq Oral Q4H  . rivaroxaban  20 mg Oral Q breakfast  . sodium chloride flush  3 mL Intravenous Q12H  . spironolactone  50 mg Oral Daily  . tamsulosin  0.4 mg Oral Daily   Continuous Infusions: . sodium chloride       LOS: 2 days    Time spent: 25 minutes.    Chaya Jan, MD [facility] Pager  [phone]  If 7PM-7AM, please contact night-coverage [email or web address] Password [email or web address] 03/13/2018, 11:30 AM

## 2018-03-14 LAB — BASIC METABOLIC PANEL WITH GFR
Anion gap: 9 (ref 5–15)
BUN: 17 mg/dL (ref 6–20)
CO2: 29 mmol/L (ref 22–32)
Calcium: 8.8 mg/dL — ABNORMAL LOW (ref 8.9–10.3)
Chloride: 107 mmol/L (ref 98–111)
Creatinine, Ser: 1.19 mg/dL (ref 0.61–1.24)
GFR calc Af Amer: >60 mL/min
GFR calc non Af Amer: >60 mL/min
Glucose, Bld: 89 mg/dL (ref 70–99)
Potassium: 3.3 mmol/L — ABNORMAL LOW (ref 3.5–5.1)
Sodium: 145 mmol/L (ref 135–145)

## 2018-03-14 NOTE — Discharge Summary (Signed)
Physician Discharge Summary  [name]:[id number] DOB: 21-Jun-1966 DOA: 03/10/2018  PCP: Pearson Grippe, MD  Admit date: 03/10/2018 Discharge date: 03/14/2018  Time spent: 45 minutes  Recommendations for Outpatient Follow-up:  -Will be discharged home today. -Advised to follow-up with cardiology within 1 to 2 weeks.  Discharge Diagnoses:  Principal Problem:   Acute on chronic diastolic CHF (congestive heart failure) (HCC) Active Problems:   Hyperlipidemia   OBESITY, MORBID   Essential hypertension   Obesity hypoventilation syndrome (HCC)   GERD (gastroesophageal reflux disease)   Pulmonary hypertension (HCC)   OSA (obstructive sleep apnea)   Noncompliance with diet and medication regimen   Morbid obesity due to excess calories (HCC)   Chronic anticoagulation-Xarelto   CKD (chronic kidney disease), stage II   Pressure injury of skin   Discharge Condition: Stable and improved  Filed Weights   03/12/18 0656 03/13/18 0500 03/14/18 0500  Weight: (!) 172.2 kg (!) 172.8 kg (!) 172.3 kg    History of present illness:   [name] is a 52 y.o. male well-known to [place] from multiple hospitalizations for acute on chronic combined heart failure mainly due to medication and dietary noncompliance.  Per most recent echo, his EF has now recovered.  His main complaint is a progressive 1 week history of increased lower extremity edema, although he also complains of extreme weakness and shortness of breath.  He denies chest pain.  Due to clear chest x-ray and low BNP, EDP try to discharge patient home but patient was adamant that he needed diuresis and his we are called to admit for further evaluation and management.  Hospital Course:   Acute on chronic diastolic heart failure -Last echo from November 2018 showed an ejection fraction of 55% with akinesis of the basal inferior myocardium, not technically sufficient to evaluate LV diastolic dysfunction due to atrial fibrillation. -Urine  output was 2050 cc overnight.  Total of 4760 cc negative since admission. -I believe he would benefit from 1-2 more days of additional IV diuresis prior to discharge home, but patient is adamant that he is leaving today. -Continue lisinopril, Coreg.  Hypokalemia -Continue to replace, he had refused supplementation yesterday, agrees to take it today. -Potassium is 3.3 on day of discharge, he has been given 40 mEq prior to DC. -Magnesium is normal at 2.1.  Atrial fibrillation -He is rate controlled on Coreg, chronically anticoagulated on Xarelto.  Morbid obesity -BMI of 47.2, counseled on lifestyle modifications.  OSA/OHS -Refuses CPAP  Stage II chronic kidney disease -Creatinine remains at his baseline of around 1.1-1.3.  Hypertension -Well-controlled, continue current medications.  Hyperlipidemia -Continue statin.   Procedures:  None   Consultations:  None  Discharge Instructions  Discharge Instructions    Diet - low sodium heart healthy   Complete by:  As directed    Increase activity slowly   Complete by:  As directed      Allergies as of 03/14/2018      Reactions   Banana Shortness Of Breath   Bee Venom Shortness Of Breath, Swelling, Other (See Comments)   Reaction:  Facial swelling   Food Shortness Of Breath, Rash, Other (See Comments)   Pt states that he is allergic to strawberries.     Aspirin Other (See Comments)   Reaction:  GI upset    Metolazone Other (See Comments)   Pt states that he stopped taking this med due to heart attack like symptoms.    Oatmeal Nausea And  Vomiting   Orange Juice [orange Oil] Nausea And Vomiting   All acidic products make him nauseous and upset stomach   Torsemide Swelling, Other (See Comments)   Reaction:  Swelling of feet/legs    Diltiazem Palpitations   Hydralazine Palpitations   Lipitor [atorvastatin] Other (See Comments)   Reaction:  Nose bleeds       Medication List    TAKE these medications     albuterol (2.5 MG/3ML) 0.083% nebulizer solution Commonly known as:  PROVENTIL Take 2.5 mg by nebulization every 6 (six) hours as needed for wheezing or shortness of breath.   aspirin 81 MG chewable tablet Chew 1 tablet (81 mg total) by mouth daily.   atorvastatin 40 MG tablet Commonly known as:  LIPITOR Take 1 tablet (40 mg total) by mouth daily at 6 PM.   beclomethasone 80 MCG/ACT inhaler Commonly known as:  QVAR Inhale 2 puffs into the lungs 2 (two) times daily.   bumetanide 2 MG tablet Commonly known as:  BUMEX Take 2 mg by mouth 2 (two) times daily.   carvedilol 25 MG tablet Commonly known as:  COREG Take 1.5 tablets (37.5 mg total) by mouth 2 (two) times daily with a meal.   docusate sodium 100 MG capsule Commonly known as:  COLACE Take 1 capsule (100 mg total) by mouth 2 (two) times daily. What changed:    when to take this  reasons to take this   ferrous gluconate 324 MG tablet Commonly known as:  FERGON Take 1 tablet (324 mg total) by mouth 2 (two) times daily with a meal. What changed:  when to take this   lisinopril 5 MG tablet Commonly known as:  PRINIVIL,ZESTRIL Take 1 tablet (5 mg total) by mouth daily.   OXYGEN Inhale 3.5 L into the lungs daily.   pantoprazole 40 MG tablet Commonly known as:  PROTONIX Take 1 tablet (40 mg total) by mouth daily at 6 (six) AM. What changed:  when to take this   potassium chloride SA 20 MEQ tablet Commonly known as:  K-DUR,KLOR-CON Take 1 tablet (20 mEq total) by mouth daily.   rivaroxaban 20 MG Tabs tablet Commonly known as:  XARELTO Take 1 tablet (20 mg total) by mouth daily with breakfast. What changed:  when to take this   spironolactone 50 MG tablet Commonly known as:  ALDACTONE Take 50 mg daily by mouth. For fluid   tamsulosin 0.4 MG Caps capsule Commonly known as:  FLOMAX Take 1 capsule (0.4 mg total) by mouth daily.      Allergies  Allergen Reactions  . Banana Shortness Of Breath  . Bee  Venom Shortness Of Breath, Swelling and Other (See Comments)    Reaction:  Facial swelling  . Food Shortness Of Breath, Rash and Other (See Comments)    Pt states that he is allergic to strawberries.    . Aspirin Other (See Comments)    Reaction:  GI upset   . Metolazone Other (See Comments)    Pt states that he stopped taking this med due to heart attack like symptoms.   . Oatmeal Nausea And Vomiting  . Orange Juice [Orange Oil] Nausea And Vomiting    All acidic products make him nauseous and upset stomach  . Torsemide Swelling and Other (See Comments)    Reaction:  Swelling of feet/legs   . Diltiazem Palpitations  . Hydralazine Palpitations  . Lipitor [Atorvastatin] Other (See Comments)    Reaction:  Nose bleeds  Follow-up Information    Pearson Grippe, MD.   Specialty:  Internal Medicine Contact information: [address] [place] [address] [phone]            The results of significant diagnostics from this hospitalization (including imaging, microbiology, ancillary and laboratory) are listed below for reference.    Significant Diagnostic Studies: Dg Chest 2 View  Result Date: 03/11/2018 CLINICAL DATA:  Weakness and dyspnea x1 week. EXAM: CHEST - 2 VIEW COMPARISON:  None. FINDINGS: Stable cardiomegaly and mediastinal contours. Mild to moderate central pulmonary vascular congestion. No acute osseous abnormality. IMPRESSION: Cardiomegaly with stigmata of mild CHF. Electronically Signed   By: Tollie Eth M.D.   On: 03/11/2018 00:22    Microbiology: No results found for this or any previous visit (from the past 240 hour(s)).   Labs: Basic Metabolic Panel: Recent Labs  Lab 03/10/18 2335 03/12/18 0640 03/13/18 0600 03/14/18 0525  NA 140 144 142 145  K 2.5* 3.1* 3.0* 3.3*  CL 99 106 105 107  CO2 33* 30 27 29   GLUCOSE 97 91 92 89  BUN 13 12 16 17   CREATININE 1.13 1.18 1.19 1.19  CALCIUM 8.6* 8.5* 8.6* 8.8*  MG 2.1  --   --   --    Liver  Function Tests: Recent Labs  Lab 03/10/18 2335  AST 30  ALT 21  ALKPHOS 95  BILITOT 1.1  PROT 7.9  ALBUMIN 4.1   No results for input(s): LIPASE, AMYLASE in the last 168 hours. No results for input(s): AMMONIA in the last 168 hours. CBC: Recent Labs  Lab 03/10/18 2335  WBC 5.4  NEUTROABS 3.5  HGB 14.2  HCT 42.4  MCV 88.1  PLT 201   Cardiac Enzymes: No results for input(s): CKTOTAL, CKMB, CKMBINDEX, TROPONINI in the last 168 hours. BNP: BNP (last 3 results) Recent Labs    05/11/17 0308 09/08/17 1340 03/10/18 2335  BNP 487.0* 233.0* 95.0    ProBNP (last 3 results) No results for input(s): PROBNP in the last 8760 hours.  CBG: Recent Labs  Lab 03/11/18 1526 03/11/18 2202  GLUCAP 95 92       Signed:  Chaya Jan  [facility] Pager: [phone] 03/14/2018, 1:02 PM

## 2018-03-14 NOTE — Care Management Note (Signed)
Case Management Note  Patient Details  Name: [name] MRN: [id number] Date of Birth: 23-Aug-1965  Subjective/Objective:  Admitted with CHF. Pt from home, lives with mother. Pt has home O2. Pt has PCP and cardiologist. Pt is noncompliant with diet and medications. Pt has been discharged from multiple [facility] agencies, [facility] and the [facility] program d/t non-compliance.                   Action/Plan: DC home today with self care. Anticipate poor compliance.   Expected Discharge Date:  03/14/18               Expected Discharge Plan:  Home/Self Care  In-House Referral:  NA  Discharge planning Services  CM Consult  Post Acute Care Choice:  NA Choice offered to:  NA  Status of Service:  Completed, signed off   [name], RN 03/14/2018, 1:34 PM

## 2018-03-14 NOTE — Progress Notes (Signed)
Iv removed, 2x2 gauze and paper tape applied to site, patient tolerated well.  Reviewed AVS with patient who verbalized understanding.  Patient transported home by a friend.

## 2018-03-14 NOTE — Care Management Important Message (Signed)
Important Message  Patient Details  Name: [name] MRN: [id number] Date of Birth: 1966/06/22   Medicare Important Message Given:  Yes    Renie Ora 03/14/2018, 11:23 AM

## 2018-03-21 DIAGNOSIS — I10 Essential (primary) hypertension: Secondary | ICD-10-CM | POA: Diagnosis not present

## 2018-03-21 DIAGNOSIS — G4733 Obstructive sleep apnea (adult) (pediatric): Secondary | ICD-10-CM | POA: Diagnosis not present

## 2018-03-21 DIAGNOSIS — E785 Hyperlipidemia, unspecified: Secondary | ICD-10-CM | POA: Diagnosis not present

## 2018-03-21 DIAGNOSIS — I5023 Acute on chronic systolic (congestive) heart failure: Secondary | ICD-10-CM | POA: Diagnosis not present

## 2018-03-21 DIAGNOSIS — I482 Chronic atrial fibrillation: Secondary | ICD-10-CM | POA: Diagnosis not present

## 2018-03-26 IMAGING — DX DG CHEST 2V
2 series · 2 of 2 positions shown · non-contrast
Comparison: 07/07/2016

CLINICAL DATA: Shortness of breath for 2 days

EXAM:
CHEST  2 VIEW

[chest lat]
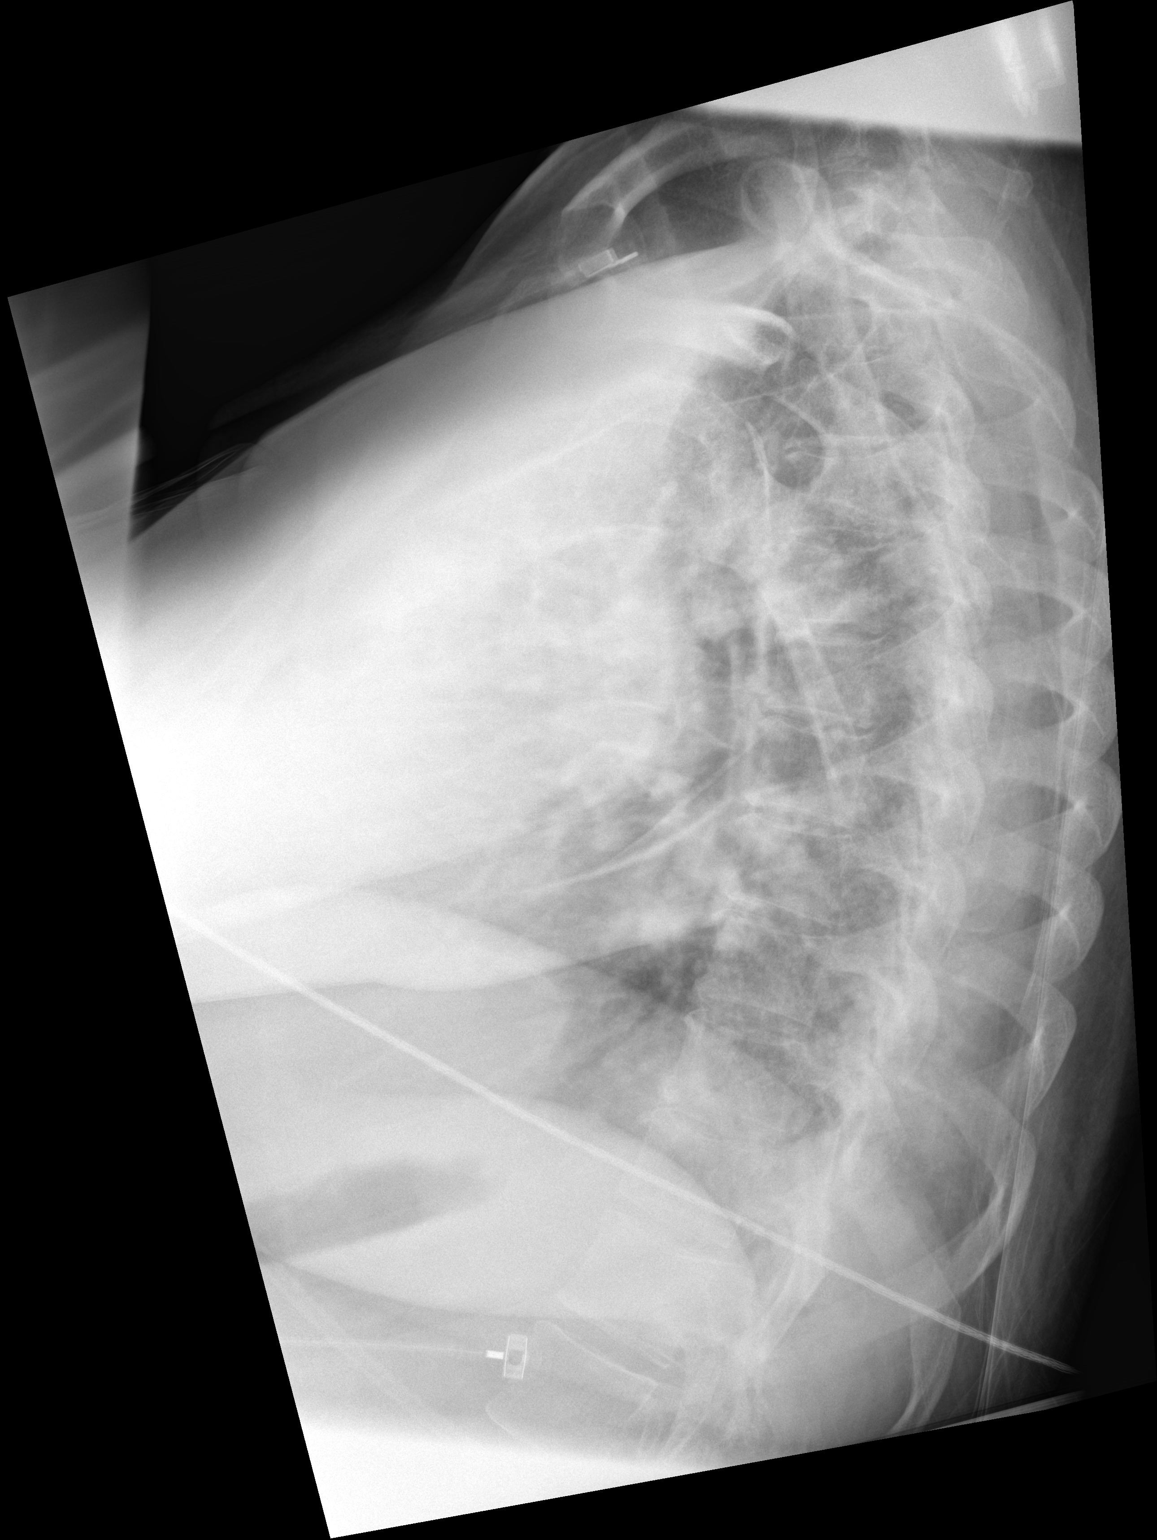

[chest ap]
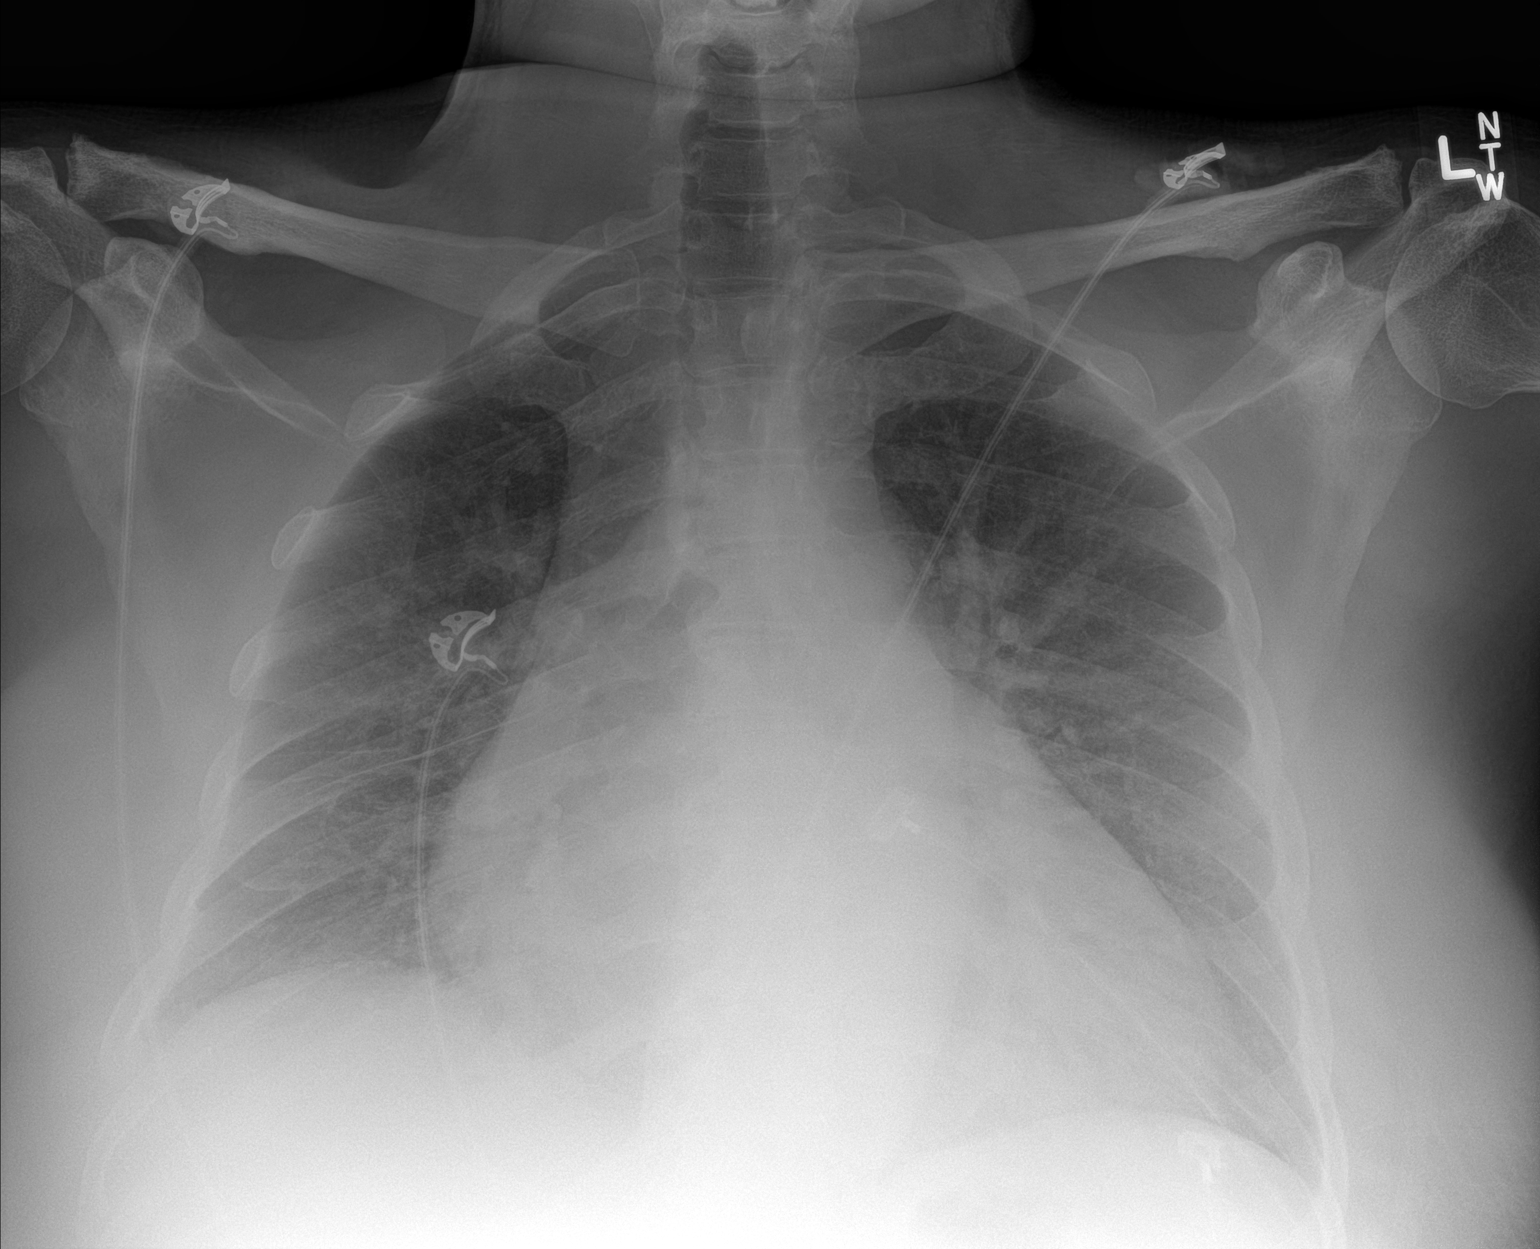

[2 of 2 positions shown; findings below may reference images not displayed]

FINDINGS: Cardiomegaly with vascular congestion. No overt edema. No confluent
opacities or effusions.
IMPRESSION: Cardiomegaly, vascular congestion.

## 2018-03-31 DIAGNOSIS — R05 Cough: Secondary | ICD-10-CM | POA: Diagnosis not present

## 2018-03-31 DIAGNOSIS — R600 Localized edema: Secondary | ICD-10-CM | POA: Diagnosis not present

## 2018-03-31 DIAGNOSIS — R0602 Shortness of breath: Secondary | ICD-10-CM | POA: Diagnosis not present

## 2018-03-31 DIAGNOSIS — I5023 Acute on chronic systolic (congestive) heart failure: Secondary | ICD-10-CM | POA: Diagnosis not present

## 2018-04-01 ENCOUNTER — Other Ambulatory Visit: Payer: Self-pay

## 2018-04-01 ENCOUNTER — Encounter (HOSPITAL_COMMUNITY): Payer: Self-pay

## 2018-04-01 ENCOUNTER — Observation Stay (HOSPITAL_COMMUNITY)
Admission: EM | Admit: 2018-04-01 | Discharge: 2018-04-03 | Disposition: A | Discharge status: Home / Self Care | Payer: Medicare Other | Attending: Internal Medicine | Admitting: Internal Medicine

## 2018-04-01 ENCOUNTER — Observation Stay (HOSPITAL_BASED_OUTPATIENT_CLINIC_OR_DEPARTMENT_OTHER): Payer: Medicare Other

## 2018-04-01 ENCOUNTER — Emergency Department (HOSPITAL_COMMUNITY): Payer: Medicare Other

## 2018-04-01 DIAGNOSIS — I5030 Unspecified diastolic (congestive) heart failure: Secondary | ICD-10-CM

## 2018-04-01 DIAGNOSIS — I5033 Acute on chronic diastolic (congestive) heart failure: Secondary | ICD-10-CM

## 2018-04-01 DIAGNOSIS — E785 Hyperlipidemia, unspecified: Secondary | ICD-10-CM | POA: Diagnosis not present

## 2018-04-01 DIAGNOSIS — I5043 Acute on chronic combined systolic (congestive) and diastolic (congestive) heart failure: Secondary | ICD-10-CM | POA: Diagnosis not present

## 2018-04-01 DIAGNOSIS — Z7982 Long term (current) use of aspirin: Secondary | ICD-10-CM | POA: Insufficient documentation

## 2018-04-01 DIAGNOSIS — E876 Hypokalemia: Secondary | ICD-10-CM

## 2018-04-01 DIAGNOSIS — I5041 Acute combined systolic (congestive) and diastolic (congestive) heart failure: Principal | ICD-10-CM | POA: Insufficient documentation

## 2018-04-01 DIAGNOSIS — Z87891 Personal history of nicotine dependence: Secondary | ICD-10-CM | POA: Insufficient documentation

## 2018-04-01 DIAGNOSIS — I482 Chronic atrial fibrillation, unspecified: Secondary | ICD-10-CM | POA: Diagnosis present

## 2018-04-01 DIAGNOSIS — J80 Acute respiratory distress syndrome: Secondary | ICD-10-CM | POA: Diagnosis not present

## 2018-04-01 DIAGNOSIS — J449 Chronic obstructive pulmonary disease, unspecified: Secondary | ICD-10-CM | POA: Insufficient documentation

## 2018-04-01 DIAGNOSIS — I4891 Unspecified atrial fibrillation: Secondary | ICD-10-CM | POA: Insufficient documentation

## 2018-04-01 DIAGNOSIS — I10 Essential (primary) hypertension: Secondary | ICD-10-CM | POA: Diagnosis present

## 2018-04-01 DIAGNOSIS — N183 Chronic kidney disease, stage 3 (moderate): Secondary | ICD-10-CM | POA: Insufficient documentation

## 2018-04-01 DIAGNOSIS — Z79899 Other long term (current) drug therapy: Secondary | ICD-10-CM | POA: Diagnosis not present

## 2018-04-01 DIAGNOSIS — I110 Hypertensive heart disease with heart failure: Secondary | ICD-10-CM | POA: Diagnosis not present

## 2018-04-01 DIAGNOSIS — G4733 Obstructive sleep apnea (adult) (pediatric): Secondary | ICD-10-CM | POA: Diagnosis not present

## 2018-04-01 DIAGNOSIS — K219 Gastro-esophageal reflux disease without esophagitis: Secondary | ICD-10-CM | POA: Insufficient documentation

## 2018-04-01 DIAGNOSIS — I491 Atrial premature depolarization: Secondary | ICD-10-CM | POA: Diagnosis not present

## 2018-04-01 DIAGNOSIS — R0602 Shortness of breath: Secondary | ICD-10-CM | POA: Diagnosis not present

## 2018-04-01 DIAGNOSIS — I130 Hypertensive heart and chronic kidney disease with heart failure and stage 1 through stage 4 chronic kidney disease, or unspecified chronic kidney disease: Secondary | ICD-10-CM | POA: Insufficient documentation

## 2018-04-01 DIAGNOSIS — I499 Cardiac arrhythmia, unspecified: Secondary | ICD-10-CM | POA: Diagnosis not present

## 2018-04-01 LAB — COMPREHENSIVE METABOLIC PANEL
ALT: 16 U/L (ref 0–44)
AST: 21 U/L (ref 15–41)
Albumin: 3.9 g/dL (ref 3.5–5.0)
Alkaline Phosphatase: 66 U/L (ref 38–126)
Anion gap: 8 (ref 5–15)
BILIRUBIN TOTAL: 1.6 mg/dL — AB (ref 0.3–1.2)
BUN: 13 mg/dL (ref 6–20)
CO2: 31 mmol/L (ref 22–32)
CREATININE: 1.26 mg/dL — AB (ref 0.61–1.24)
Calcium: 8.5 mg/dL — ABNORMAL LOW (ref 8.9–10.3)
Chloride: 100 mmol/L (ref 98–111)
Glucose, Bld: 100 mg/dL — ABNORMAL HIGH (ref 70–99)
Potassium: 2.9 mmol/L — ABNORMAL LOW (ref 3.5–5.1)
Sodium: 139 mmol/L (ref 135–145)
TOTAL PROTEIN: 8 g/dL (ref 6.5–8.1)

## 2018-04-01 LAB — CBC WITH DIFFERENTIAL/PLATELET
Abs Immature Granulocytes: 0.02 10*3/uL (ref 0.00–0.07)
BASOS PCT: 0 %
Basophils Absolute: 0 10*3/uL (ref 0.0–0.1)
EOS ABS: 0.1 10*3/uL (ref 0.0–0.5)
EOS PCT: 2 %
HCT: 42.1 % (ref 39.0–52.0)
Hemoglobin: 13.3 g/dL (ref 13.0–17.0)
IMMATURE GRANULOCYTES: 0 %
Lymphocytes Relative: 15 %
Lymphs Abs: 1 10*3/uL (ref 0.7–4.0)
MCH: 28.1 pg (ref 26.0–34.0)
MCHC: 31.6 g/dL (ref 30.0–36.0)
MCV: 88.8 fL (ref 80.0–100.0)
Monocytes Absolute: 0.7 10*3/uL (ref 0.1–1.0)
Monocytes Relative: 11 %
NEUTROS PCT: 72 %
NRBC: 0 % (ref 0.0–0.2)
Neutro Abs: 4.8 10*3/uL (ref 1.7–7.7)
PLATELETS: 194 10*3/uL (ref 150–400)
RBC: 4.74 MIL/uL (ref 4.22–5.81)
RDW: 13.9 % (ref 11.5–15.5)
WBC: 6.7 10*3/uL (ref 4.0–10.5)

## 2018-04-01 LAB — BASIC METABOLIC PANEL
ANION GAP: 11 (ref 5–15)
BUN: 12 mg/dL (ref 6–20)
CALCIUM: 8.1 mg/dL — AB (ref 8.9–10.3)
CHLORIDE: 101 mmol/L (ref 98–111)
CO2: 26 mmol/L (ref 22–32)
Creatinine, Ser: 1.13 mg/dL (ref 0.61–1.24)
GFR calc Af Amer: >60 mL/min (ref >60)
GFR calc non Af Amer: >60 mL/min (ref >60)
GLUCOSE: 134 mg/dL — AB (ref 70–99)
POTASSIUM: 2.9 mmol/L — AB (ref 3.5–5.1)
Sodium: 138 mmol/L (ref 135–145)

## 2018-04-01 LAB — TROPONIN I
TROPONIN I: 0.03 ng/mL — AB (ref <0.03)
Troponin I: 0.03 ng/mL (ref <0.03)
Troponin I: 0.03 ng/mL (ref <0.03)
Troponin I: <0.03 ng/mL (ref <0.03)

## 2018-04-01 LAB — URINALYSIS, ROUTINE W REFLEX MICROSCOPIC
BILIRUBIN URINE: NEGATIVE
Glucose, UA: NEGATIVE mg/dL
HGB URINE DIPSTICK: NEGATIVE
Ketones, ur: NEGATIVE mg/dL
Leukocytes, UA: NEGATIVE
Nitrite: NEGATIVE
PH: 7 (ref 5.0–8.0)
Protein, ur: NEGATIVE mg/dL
SPECIFIC GRAVITY, URINE: 1.008 (ref 1.005–1.030)

## 2018-04-01 LAB — PHOSPHORUS: PHOSPHORUS: 3.5 mg/dL (ref 2.5–4.6)

## 2018-04-01 LAB — BRAIN NATRIURETIC PEPTIDE: B NATRIURETIC PEPTIDE 5: 115 pg/mL — AB (ref 0.0–100.0)

## 2018-04-01 LAB — ECHOCARDIOGRAM COMPLETE

## 2018-04-01 LAB — MAGNESIUM
Magnesium: 1.6 mg/dL — ABNORMAL LOW (ref 1.7–2.4)
Magnesium: 2.2 mg/dL (ref 1.7–2.4)

## 2018-04-01 MED ORDER — SODIUM CHLORIDE 0.45 % IV SOLN
INTRAVENOUS | Status: DC
  Administered 2018-04-01: 20:00:00 via INTRAVENOUS

## 2018-04-01 MED ORDER — POTASSIUM CHLORIDE 20 MEQ/15ML (10%) PO SOLN
40.0000 meq | Freq: Once | ORAL | Status: AC
  Administered 2018-04-01: 40 meq via ORAL
  Filled 2018-04-01: qty 30

## 2018-04-01 MED ORDER — POTASSIUM CHLORIDE 10 MEQ/100ML IV SOLN
10.0000 meq | INTRAVENOUS | Status: DC
  Filled 2018-04-01: qty 100

## 2018-04-01 MED ORDER — POTASSIUM CHLORIDE CRYS ER 20 MEQ PO TBCR
40.0000 meq | EXTENDED_RELEASE_TABLET | Freq: Two times a day (BID) | ORAL | Status: DC
  Administered 2018-04-01 – 2018-04-03 (×5): 40 meq via ORAL
  Filled 2018-04-01 (×5): qty 2

## 2018-04-01 MED ORDER — MAGNESIUM OXIDE 400 (241.3 MG) MG PO TABS
400.0000 mg | ORAL_TABLET | Freq: Two times a day (BID) | ORAL | Status: DC
  Administered 2018-04-02 – 2018-04-03 (×3): 400 mg via ORAL
  Filled 2018-04-01 (×3): qty 1

## 2018-04-01 MED ORDER — MAGNESIUM SULFATE 4 GM/100ML IV SOLN
4.0000 g | Freq: Once | INTRAVENOUS | Status: AC
  Administered 2018-04-01: 4 g via INTRAVENOUS
  Filled 2018-04-01: qty 100

## 2018-04-01 MED ORDER — PERFLUTREN LIPID MICROSPHERE
1.0000 mL | INTRAVENOUS | Status: AC | PRN
  Administered 2018-04-01: 2 mL via INTRAVENOUS
  Administered 2018-04-01: 1 mL via INTRAVENOUS
  Filled 2018-04-01: qty 10

## 2018-04-01 MED ORDER — ONDANSETRON HCL 4 MG/2ML IJ SOLN: 4.0000 mg | Freq: Four times a day (QID) | INTRAMUSCULAR | Status: DC | PRN

## 2018-04-01 MED ORDER — ACETAMINOPHEN 650 MG RE SUPP: 650.0000 mg | Freq: Four times a day (QID) | RECTAL | Status: DC | PRN

## 2018-04-01 MED ORDER — FAMOTIDINE 20 MG PO TABS
20.0000 mg | ORAL_TABLET | Freq: Two times a day (BID) | ORAL | Status: DC
  Administered 2018-04-01 – 2018-04-03 (×5): 20 mg via ORAL
  Filled 2018-04-01 (×5): qty 1

## 2018-04-01 MED ORDER — ACETAMINOPHEN 325 MG PO TABS: 650.0000 mg | ORAL_TABLET | Freq: Four times a day (QID) | ORAL | Status: DC | PRN

## 2018-04-01 MED ORDER — CARVEDILOL 12.5 MG PO TABS
37.5000 mg | ORAL_TABLET | Freq: Two times a day (BID) | ORAL | Status: DC
  Administered 2018-04-01 – 2018-04-02 (×3): 37.5 mg via ORAL
  Filled 2018-04-01 (×4): qty 3

## 2018-04-01 MED ORDER — FUROSEMIDE 10 MG/ML IJ SOLN
40.0000 mg | Freq: Once | INTRAMUSCULAR | Status: AC
  Administered 2018-04-01: 40 mg via INTRAVENOUS
  Filled 2018-04-01: qty 4

## 2018-04-01 MED ORDER — FUROSEMIDE 10 MG/ML IJ SOLN
40.0000 mg | Freq: Three times a day (TID) | INTRAMUSCULAR | Status: DC
  Administered 2018-04-01 – 2018-04-02 (×4): 40 mg via INTRAVENOUS
  Filled 2018-04-01 (×4): qty 4

## 2018-04-01 MED ORDER — POTASSIUM CHLORIDE CRYS ER 20 MEQ PO TBCR
40.0000 meq | EXTENDED_RELEASE_TABLET | Freq: Once | ORAL | Status: AC
  Administered 2018-04-01: 40 meq via ORAL
  Filled 2018-04-01: qty 2

## 2018-04-01 MED ORDER — ONDANSETRON HCL 4 MG PO TABS: 4.0000 mg | ORAL_TABLET | Freq: Four times a day (QID) | ORAL | Status: DC | PRN

## 2018-04-01 MED ORDER — METOPROLOL TARTRATE 5 MG/5ML IV SOLN
5.0000 mg | Freq: Once | INTRAVENOUS | Status: DC
  Filled 2018-04-01: qty 5

## 2018-04-01 NOTE — ED Notes (Signed)
Date and time results received: 04/01/18 0433  Test: Troponin Critical Value: 0.03  Name of Provider Notified: Rancour, MD

## 2018-04-01 NOTE — Care Management Obs Status (Signed)
MEDICARE OBSERVATION STATUS NOTIFICATION   Patient Details  Name: BAYLEY BEAMESDERFER MRN: [id number] Date of Birth: [birth year]   Medicare Observation Status Notification Given:       Renie Ora 04/01/2018, 2:45 PM

## 2018-04-01 NOTE — Progress Notes (Signed)
Patient refusing IV Potassium. First dose initiated and stopped, patient received approximately 30 ml of IV potassium. Patient stated that it is burning and it always causes his stomach to hurt. Dr. Allena Katz notified. New orders placed.

## 2018-04-01 NOTE — ED Provider Notes (Signed)
[facility] EMERGENCY DEPARTMENT Provider Note   CSN: [id number] Arrival date & time: 04/01/18  0320     History   Chief Complaint Chief Complaint  Patient presents with  . Shortness of Breath    HPI [name] is a 52 y.o. male.  Patient well-known to the ED.  History of CHF, COPD, home oxygen use, hypertension, atrial fibrillation presenting with difficulty breathing or shortness of breath worsening since last evening.  EMS reports he was short of breath on exertion and had a placed on CPAP on their arrival.  His work of breathing has since improved with resting.  He states his difficulty breathing starting in the mid afternoon yesterday.  No cough, chest pain or fever.  Leg swelling is at baseline.  Feels his weight may be slightly increased.  States compliance with his medications and diuretics.  No fevers, chills, nausea or vomiting.  The history is provided by the patient and the EMS personnel. The history is limited by the condition of the patient.  Shortness of Breath  Associated symptoms include leg swelling. Pertinent negatives include no headaches, no rhinorrhea, no cough, no chest pain, no vomiting, no abdominal pain and no rash.    Past Medical History:  Diagnosis Date  . Allergic rhinitis   . Asthma   . Atrial fibrillation (HCC)   . CHF (congestive heart failure) (HCC)   . Essential hypertension   . Gastroesophageal reflux disease   . Heart attack (HCC)   . History of cardiac catheterization    Normal coronaries December 2016  . Noncompliance    Major problem leading to declining health and recurrent hospitalization  . Nonischemic cardiomyopathy (HCC)    LVEF 20-25%  . On home O2    3L N/C  . OSA (obstructive sleep apnea)   . Osteoarthritis   . Peptic ulcer disease     Patient Active Problem List   Diagnosis Date Noted  . Acute on chronic diastolic CHF (congestive heart failure) (HCC) 03/11/2018  . Pressure injury of skin 03/11/2018  . CHF  (congestive heart failure) (HCC) 09/08/2017  . Altered mental status 05/11/2017  . Acute exacerbation of CHF (congestive heart failure) (HCC) 04/20/2017  . Acute on chronic systolic (congestive) heart failure (HCC) 04/19/2017  . CHF exacerbation (HCC) 04/11/2017  . Chronic respiratory failure with hypoxia (HCC) 04/11/2017  . Encounter for hospice care discussion   . Palliative care encounter   . Goals of care, counseling/discussion   . DNR (do not resuscitate)   . DNR (do not resuscitate) discussion   . Chronic congestive heart failure (HCC) 02/18/2017  . Acute systolic heart failure (HCC) 01/18/2017  . Pedal edema 12/02/2016  . Acute CHF (congestive heart failure) (HCC) 11/09/2016  . Acute on chronic systolic CHF (congestive heart failure) (HCC) 08/19/2016  . CKD (chronic kidney disease), stage II 05/17/2016  . Coronary arteries, normal Dec 2016 01/23/2016  . Chronic anticoagulation-Xarelto 01/23/2016  . NSVT (nonsustained ventricular tachycardia) (HCC) 01/23/2016  . Elevated troponin 12/21/2015  . Nonischemic cardiomyopathy (HCC)   . Morbid obesity due to excess calories (HCC)   . Noncompliance with diet and medication regimen 12/02/2015  . OSA (obstructive sleep apnea) 09/20/2015  . Pulmonary hypertension (HCC) 09/19/2015  . Normocytic anemia 09/19/2015  . Atrial fibrillation, chronic   . Dyspnea on exertion 05/28/2015  . Acute respiratory failure with hypoxia (HCC) 04/01/2015  . Hypokalemia 04/01/2015  . Obesity hypoventilation syndrome (HCC) 08/08/2014  . GERD (gastroesophageal reflux disease) 08/08/2014  .  Edema of right lower extremity 06/21/2014  . Fecal urgency 06/21/2014  . Asthma 02/11/2009  . Hyperlipidemia 07/12/2008  . OBESITY, MORBID 07/12/2008  . Anxiety state 07/12/2008  . DEPRESSION 07/12/2008  . Essential hypertension 07/12/2008  . ALLERGIC RHINITIS 07/12/2008  . Peptic ulcer 07/12/2008  . OSTEOARTHRITIS 07/12/2008    Past Surgical History:  Procedure  Laterality Date  . CARDIAC CATHETERIZATION N/A 06/14/2015   Procedure: Right/Left Heart Cath and Coronary Angiography;  Surgeon: Runell Gess, MD;  Location: [facility] INVASIVE CV LAB;  Service: Cardiovascular;  Laterality: N/A;        Home Medications    Prior to Admission medications   Medication Sig Start Date End Date Taking? Authorizing Provider  albuterol (PROVENTIL) (2.5 MG/3ML) 0.083% nebulizer solution Take 2.5 mg by nebulization every 6 (six) hours as needed for wheezing or shortness of breath.    [provider]  aspirin 81 MG chewable tablet Chew 1 tablet (81 mg total) by mouth daily. 04/13/17   Erick Blinks, MD  atorvastatin (LIPITOR) 40 MG tablet Take 1 tablet (40 mg total) by mouth daily at 6 PM. 06/25/16   Kathlen Mody, MD  beclomethasone (QVAR) 80 MCG/ACT inhaler Inhale 2 puffs into the lungs 2 (two) times daily.     [provider]  bumetanide (BUMEX) 2 MG tablet Take 2 mg by mouth 2 (two) times daily.    [provider]  carvedilol (COREG) 25 MG tablet Take 1.5 tablets (37.5 mg total) by mouth 2 (two) times daily with a meal. 10/07/17   [name], MD  docusate sodium (COLACE) 100 MG capsule Take 1 capsule (100 mg total) by mouth 2 (two) times daily. Patient taking differently: Take 100 mg by mouth 2 (two) times daily as needed for mild constipation.  06/25/16   Kathlen Mody, MD  ferrous gluconate (FERGON) 324 MG tablet Take 1 tablet (324 mg total) by mouth 2 (two) times daily with a meal. Patient taking differently: Take 324 mg by mouth daily with breakfast.  06/25/16   Kathlen Mody, MD  lisinopril (PRINIVIL,ZESTRIL) 5 MG tablet Take 1 tablet (5 mg total) by mouth daily. 10/07/17   [name], MD  OXYGEN Inhale 3.5 L into the lungs daily.    [provider]  pantoprazole (PROTONIX) 40 MG tablet Take 1 tablet (40 mg total) by mouth daily at 6 (six) AM. Patient taking differently: Take 40 mg by mouth every evening.  06/26/16    Kathlen Mody, MD  potassium chloride (K-DUR,KLOR-CON) 20 MEQ tablet Take 1 tablet (20 mEq total) by mouth daily. 02/20/17   Houston Siren, MD  rivaroxaban (XARELTO) 20 MG TABS tablet Take 1 tablet (20 mg total) by mouth daily with breakfast. Patient taking differently: Take 20 mg by mouth daily with supper.  06/26/16   Kathlen Mody, MD  spironolactone (ALDACTONE) 50 MG tablet Take 50 mg daily by mouth. For fluid 03/18/17   [provider]  tamsulosin (FLOMAX) 0.4 MG CAPS capsule Take 1 capsule (0.4 mg total) by mouth daily. 06/25/16   Kathlen Mody, MD  potassium chloride 20 MEQ TBCR Take 20 mEq by mouth 2 (two) times daily. 09/03/15 09/03/15  Ward, Layla Maw, DO    Family History Family History  Problem Relation Age of Onset  . Stroke Father   . Heart attack Father   . Aneurysm Mother        Cerebral aneurysm  . Hypertension Sister   . Colon cancer Neg Hx   .  Inflammatory bowel disease Neg Hx   . Liver disease Neg Hx     Social History Social History   Tobacco Use  . Smoking status: Former Smoker    Packs/day: 0.50    Years: 20.00    Pack years: 10.00    Types: Cigarettes    Start date: 04/26/1988    Last attempt to quit: 06/23/2007    Years since quitting: 10.7  . Smokeless tobacco: Never Used  . Tobacco comment: 1 ppd former smoker  Substance Use Topics  . Alcohol use: No    Alcohol/week: 0.0 standard drinks    Comment: No etoh since 2009  . Drug use: No     Allergies   Banana; Bee venom; Food; Aspirin; Metolazone; Oatmeal; Orange juice [orange oil]; Torsemide; Diltiazem; Hydralazine; and Lipitor [atorvastatin]   Review of Systems Review of Systems  Constitutional: Positive for fatigue. Negative for activity change and appetite change.  HENT: Negative for congestion and rhinorrhea.   Eyes: Negative for visual disturbance.  Respiratory: Positive for shortness of breath. Negative for cough and chest tightness.   Cardiovascular: Positive for leg swelling. Negative  for chest pain.  Gastrointestinal: Negative for abdominal pain, nausea and vomiting.  Genitourinary: Negative for dysuria and hematuria.  Musculoskeletal: Negative for arthralgias and myalgias.  Skin: Negative for rash.  Neurological: Negative for dizziness, weakness, light-headedness, numbness and headaches.    all other systems are negative except as noted in the HPI and PMH.    Physical Exam Updated Vital Signs BP (!) 166/93   Pulse (!) 48   Resp 15   SpO2 100%   Physical Exam  Constitutional: He is oriented to person, place, and time. He appears well-developed and well-nourished. No distress.  Mildly somnolent, arouses and answers questions appropriately  HENT:  Head: Normocephalic and atraumatic.  Mouth/Throat: Oropharynx is clear and moist. No oropharyngeal exudate.  Eyes: Pupils are equal, round, and reactive to light. Conjunctivae and EOM are normal.  Neck: Normal range of motion. Neck supple.  No meningismus.  Cardiovascular: Normal rate, regular rhythm, normal heart sounds and intact distal pulses.  No murmur heard. Pulmonary/Chest: He is in respiratory distress. He has rales. He exhibits no tenderness.  Mildly increased work of breathing with bibasilar crackles diminished air exchange  Abdominal: Soft. There is no tenderness. There is no rebound and no guarding.  Musculoskeletal: Normal range of motion. He exhibits edema. He exhibits no tenderness.  +4 edema to knees bilaterally  Neurological: He is alert and oriented to person, place, and time. No cranial nerve deficit. He exhibits normal muscle tone. Coordination normal.  No ataxia on finger to nose bilaterally. No pronator drift. 5/5 strength throughout. CN 2-12 intact.Equal grip strength. Sensation intact.   Skin: Skin is warm.  Psychiatric: He has a normal mood and affect. His behavior is normal.  Nursing note and vitals reviewed.    ED Treatments / Results  Labs (all labs ordered are listed, but only  abnormal results are displayed) Labs Reviewed  COMPREHENSIVE METABOLIC PANEL - Abnormal; Notable for the following components:      Result Value   Potassium 2.9 (*)    Glucose, Bld 100 (*)    Creatinine, Ser 1.26 (*)    Calcium 8.5 (*)    Total Bilirubin 1.6 (*)    All other components within normal limits  BRAIN NATRIURETIC PEPTIDE - Abnormal; Notable for the following components:   B Natriuretic Peptide 115.0 (*)    All other components within normal  limits  TROPONIN I - Abnormal; Notable for the following components:   Troponin I 0.03 (*)    All other components within normal limits  BLOOD GAS, ARTERIAL - Abnormal; Notable for the following components:   pO2, Arterial 120 (*)    Bicarbonate 29.0 (*)    Acid-Base Excess 5.0 (*)    All other components within normal limits  MAGNESIUM - Abnormal; Notable for the following components:   Magnesium 1.6 (*)    All other components within normal limits  CBC WITH DIFFERENTIAL/PLATELET  URINALYSIS, ROUTINE W REFLEX MICROSCOPIC  PHOSPHORUS  TROPONIN I  TROPONIN I  TROPONIN I    EKG EKG Interpretation  Date/Time:  Friday April 01 2018 03:23:30 EDT Ventricular Rate:  104 PR Interval:    QRS Duration: 93 QT Interval:  370 QTC Calculation: 443 R Axis:   61 Text Interpretation:  Atrial fibrillation Ventricular bigeminy Nonspecific repol abnormality, diffuse leads No significant change was found Confirmed by Glynn Octave [phone]) on 04/01/2018 3:33:36 AM   Radiology Dg Chest Portable 1 View  Result Date: 04/01/2018 CLINICAL DATA:  Increasing shortness of breath. History of asthma, congestive heart failure, hypertension, former smoker. EXAM: PORTABLE CHEST 1 VIEW COMPARISON:  03/10/2018 FINDINGS: Cardiac enlargement with mild pulmonary vascular congestion. No definite edema or consolidation. No blunting of costophrenic angles. No pneumothorax. Mediastinal contours appear intact. IMPRESSION: Cardiac enlargement with mild  pulmonary vascular congestion. No edema or consolidation. Electronically Signed   By: Burman Nieves M.D.   On: 04/01/2018 04:04    Procedures Procedures (including critical care time)  Medications Ordered in ED Medications  furosemide (LASIX) injection 40 mg (40 mg Intravenous Given 04/01/18 0341)     Initial Impression / Assessment and Plan / ED Course  I have reviewed the triage vital signs and the nursing notes.  Pertinent labs & imaging results that were available during my care of the patient were reviewed by me and considered in my medical decision making (see chart for details).    Patient with known history of sleep apnea, CHF, COPD presenting with shortness of breath. He is weaned from CPAP to nasal cannula on arrival. No Chest pain. EKG nonischemic.  ABG shows no significant CO2 retention. Vascular congestion noted on chest x-ray.  IV Lasix given.  Creatinine stable with stable minimal troponin elevation.  Patient in no distress.  States he is still too short of breath to attempt walking around and too tired.  He is still mildly tachypneic. He feels he will need admission for IV diuresis as well as supplementation of his potassium.  Discussed with Dr. Robb Matar.  Final Clinical Impressions(s) / ED Diagnoses   Final diagnoses:  None    ED Discharge Orders    None       Mckinley Adelstein, Jeannett Senior, MD 04/01/18 0630

## 2018-04-01 NOTE — ED Notes (Signed)
Hospital Provider at bedside 

## 2018-04-01 NOTE — Progress Notes (Signed)
Per HPI from Dr. Robb Matar on 10/11: [name] is a 52 y.o. male with medical history significant of allergic rhinitis, asthma, chronic atrial fibrillation, chronic combined systolic and diastolic heart failure, hypertension, GERD, history of MI with normal coronaries on cardiac cath in December 2016, nonischemic cardiomyopathy, OSA, osteoarthritis, PUD who is coming to the emergency department due to dyspnea.  EMS describes that the patient was 90% on unspecified oxygen requirement, when they arrived.  He was placed on CPAP ventilation, which improved his symptoms.  Per patient, he started getting progressively worse dyspnea since yesterday afternoon.  He has been getting increased lower extremity edema for the past few days.  He also complains of positional dizziness and palpitations.  No chest pain, nausea, emesis, diaphoresis, PND or orthopnea.  He denies dietary indiscretions.  No fever, chills, sore throat, wheezing, hemoptysis, abdominal pain, diarrhea, constipation, melena or hematochezia.  He denies dysuria, frequency or hematuria.  No polyuria, polydipsia, polyphagia or blurred vision.  No heat or cold intolerance.  Denies skin pruritus.  Patient seen and evaluated this morning.  He has received extensive potassium and magnesium supplementation and will plan to continue Lasix IV as ordered.  Repeat labs ordered for a.m.  No acute complaints or concerns currently noted.  2D echocardiogram performed with results pending.

## 2018-04-01 NOTE — H&P (Signed)
History and Physical    [name]:[id number] DOB: [birth year] DOA: 04/01/2018  PCP: Pearson Grippe, MD  Patient coming from: Home.  I have personally briefly reviewed patient's old medical records in [facility]  Chief Complaint: Shortness of breath.  HPI: [name] is a 52 y.o. male with medical history significant of allergic rhinitis, asthma, chronic atrial fibrillation, chronic combined systolic and diastolic heart failure, hypertension, GERD, history of MI with normal coronaries on cardiac cath in December 2016, nonischemic cardiomyopathy, OSA, osteoarthritis, PUD who is coming to the emergency department due to dyspnea.  EMS describes that the patient was 90% on unspecified oxygen requirement, when they arrived.  He was placed on CPAP ventilation, which improved his symptoms.  Per patient, he started getting progressively worse dyspnea since yesterday afternoon.  He has been getting increased lower extremity edema for the past few days.  He also complains of positional dizziness and palpitations.  No chest pain, nausea, emesis, diaphoresis, PND or orthopnea.  He denies dietary indiscretions.  No fever, chills, sore throat, wheezing, hemoptysis, abdominal pain, diarrhea, constipation, melena or hematochezia.  He denies dysuria, frequency or hematuria.  No polyuria, polydipsia, polyphagia or blurred vision.  No heat or cold intolerance.  Denies skin pruritus.  ED Course: Initial vital signs pulse 94, respiration 22, blood pressure 157/112 mmHg and O2 sat 100% on nasal cannula oxygen.  He received furosemide 40 mg IVP x1 dose and 40 mEq of KCl.  His EKG shows atrial fibrillation at 104 bpm, ventricular bigeminy and diffuse leads nonspecific repolarization abnormalities.  Not significantly changed when compared to previous tracing.  Troponin level was 0.03 ng/mL. BNP was 115.0 pg/mL.  An ABG was drawn still partially pending.  pH results are still not available.  PCO2 was 40.4 n.p.o.  220 mmHg.  Bicarbonate was 29.0 and acid base excess was 5.0 mmol S/L.  O2 sat 98.7%.  Urinalysis was normal.  His CBC was normal.  BMP shows a potassium of 2.9 mmol/L.  BUN of 13, creatinine 1.26, bilirubin 1.6, glucose of 100, phosphorus of 3.5, magnesium 1.6 and calcium of 8.5 mg/dL.  The rest of the CMP results are within normal limits.   Radiology: his chest radiograph showed cardiomegaly with mild pulmonary vascular congestion.  Please see images for further detail.  Review of Systems: As per HPI otherwise 10 point review of systems negative.  Past Medical History:  Diagnosis Date  . Allergic rhinitis   . Asthma   . Atrial fibrillation (HCC)   . CHF (congestive heart failure) (HCC)   . Essential hypertension   . Gastroesophageal reflux disease   . Heart attack (HCC)   . History of cardiac catheterization    Normal coronaries December 2016  . Noncompliance    Major problem leading to declining health and recurrent hospitalization  . Nonischemic cardiomyopathy (HCC)    LVEF 20-25%  . On home O2    3L N/C  . OSA (obstructive sleep apnea)   . Osteoarthritis   . Peptic ulcer disease     Past Surgical History:  Procedure Laterality Date  . CARDIAC CATHETERIZATION N/A 06/14/2015   Procedure: Right/Left Heart Cath and Coronary Angiography;  Surgeon: Runell Gess, MD;  Location: [facility] INVASIVE CV LAB;  Service: Cardiovascular;  Laterality: N/A;     reports that he quit smoking about 10 years ago. His smoking use included cigarettes. He started smoking about 29 years ago. He has a 10.00 pack-year smoking history. He has  never used smokeless tobacco. He reports that he does not drink alcohol or use drugs.  Allergies  Allergen Reactions  . Banana Shortness Of Breath  . Bee Venom Shortness Of Breath, Swelling and Other (See Comments)    Reaction:  Facial swelling  . Food Shortness Of Breath, Rash and Other (See Comments)    Pt states that he is allergic to strawberries.    .  Aspirin Other (See Comments)    Reaction:  GI upset   . Metolazone Other (See Comments)    Pt states that he stopped taking this med due to heart attack like symptoms.   . Oatmeal Nausea And Vomiting  . Orange Juice [Orange Oil] Nausea And Vomiting    All acidic products make him nauseous and upset stomach  . Torsemide Swelling and Other (See Comments)    Reaction:  Swelling of feet/legs   . Diltiazem Palpitations  . Hydralazine Palpitations  . Lipitor [Atorvastatin] Other (See Comments)    Reaction:  Nose bleeds     Family History  Problem Relation Age of Onset  . Stroke Father   . Heart attack Father   . Aneurysm Mother        Cerebral aneurysm  . Hypertension Sister   . Colon cancer Neg Hx   . Inflammatory bowel disease Neg Hx   . Liver disease Neg Hx    Prior to Admission medications   Medication Sig Start Date End Date Taking? Authorizing Provider  albuterol (PROVENTIL) (2.5 MG/3ML) 0.083% nebulizer solution Take 2.5 mg by nebulization every 6 (six) hours as needed for wheezing or shortness of breath.    [provider]  aspirin 81 MG chewable tablet Chew 1 tablet (81 mg total) by mouth daily. 04/13/17   Erick Blinks, MD  atorvastatin (LIPITOR) 40 MG tablet Take 1 tablet (40 mg total) by mouth daily at 6 PM. 06/25/16   Kathlen Mody, MD  beclomethasone (QVAR) 80 MCG/ACT inhaler Inhale 2 puffs into the lungs 2 (two) times daily.     [provider]  bumetanide (BUMEX) 2 MG tablet Take 2 mg by mouth 2 (two) times daily.    [provider]  carvedilol (COREG) 25 MG tablet Take 1.5 tablets (37.5 mg total) by mouth 2 (two) times daily with a meal. 10/07/17   [name], MD  docusate sodium (COLACE) 100 MG capsule Take 1 capsule (100 mg total) by mouth 2 (two) times daily. Patient taking differently: Take 100 mg by mouth 2 (two) times daily as needed for mild constipation.  06/25/16   Kathlen Mody, MD  ferrous gluconate (FERGON) 324 MG tablet  Take 1 tablet (324 mg total) by mouth 2 (two) times daily with a meal. Patient taking differently: Take 324 mg by mouth daily with breakfast.  06/25/16   Kathlen Mody, MD  lisinopril (PRINIVIL,ZESTRIL) 5 MG tablet Take 1 tablet (5 mg total) by mouth daily. 10/07/17   [name], MD  OXYGEN Inhale 3.5 L into the lungs daily.    [provider]  pantoprazole (PROTONIX) 40 MG tablet Take 1 tablet (40 mg total) by mouth daily at 6 (six) AM. Patient taking differently: Take 40 mg by mouth every evening.  06/26/16   Kathlen Mody, MD  potassium chloride (K-DUR,KLOR-CON) 20 MEQ tablet Take 1 tablet (20 mEq total) by mouth daily. 02/20/17   Houston Siren, MD  rivaroxaban (XARELTO) 20 MG TABS tablet Take 1 tablet (20 mg total) by mouth daily with breakfast. Patient  taking differently: Take 20 mg by mouth daily with supper.  06/26/16   Kathlen Mody, MD  spironolactone (ALDACTONE) 50 MG tablet Take 50 mg daily by mouth. For fluid 03/18/17   [provider]  tamsulosin (FLOMAX) 0.4 MG CAPS capsule Take 1 capsule (0.4 mg total) by mouth daily. 06/25/16   Kathlen Mody, MD  potassium chloride 20 MEQ TBCR Take 20 mEq by mouth 2 (two) times daily. 09/03/15 09/03/15  Ward, Layla Maw, DO    Physical Exam: Vitals:   04/01/18 0330 04/01/18 0518  BP: (!) 157/112 (!) 166/93  Pulse: 94 (!) 48  Resp: (!) 22 15  SpO2: 100% 100%    Constitutional: NAD, calm, comfortable Eyes: PERRL, lids and conjunctivae normal ENMT: Mucous membranes are moist. Posterior pharynx clear of any exudate or lesions. Neck: normal, supple, no masses, no thyromegaly, no JVD Respiratory: Bibasilar crackles.  No wheezing.  Normal respiratory effort. No accessory muscle use.  Cardiovascular: Regular rate and rhythm, no murmurs / rubs / gallops.  Stage III lymphedema.  2+ lower extremities pitting edema. 2+ pedal pulses. No carotid bruits.  Abdomen: Obese, soft, no tenderness, no masses palpated. No hepatosplenomegaly. Bowel  sounds positive.  Musculoskeletal: no clubbing / cyanosis. No joint deformity upper and lower extremities. Good ROM, no contractures. Normal muscle tone.  Skin: Ankle skin is showing scales and hyperkeratosis. Neurologic: CN 2-12 grossly intact. Sensation intact, DTR normal. Strength 5/5 in all 4.  Psychiatric: Normal judgment and insight. Alert and oriented x 3. Normal mood.   Labs on Admission: I have personally reviewed following labs and imaging studies  CBC: Recent Labs  Lab 04/01/18 0331  WBC 6.7  NEUTROABS 4.8  HGB 13.3  HCT 42.1  MCV 88.8  PLT 194   Basic Metabolic Panel: Recent Labs  Lab 04/01/18 0331  NA 139  K 2.9*  CL 100  CO2 31  GLUCOSE 100*  BUN 13  CREATININE 1.26*  CALCIUM 8.5*   GFR: CrCl cannot be calculated (Unknown ideal weight.). Liver Function Tests: Recent Labs  Lab 04/01/18 0331  AST 21  ALT 16  ALKPHOS 66  BILITOT 1.6*  PROT 8.0  ALBUMIN 3.9   No results for input(s): LIPASE, AMYLASE in the last 168 hours. No results for input(s): AMMONIA in the last 168 hours. Coagulation Profile: No results for input(s): INR, PROTIME in the last 168 hours. Cardiac Enzymes: Recent Labs  Lab 04/01/18 0331  TROPONINI 0.03*   BNP (last 3 results) No results for input(s): PROBNP in the last 8760 hours. HbA1C: No results for input(s): HGBA1C in the last 72 hours. CBG: No results for input(s): GLUCAP in the last 168 hours. Lipid Profile: No results for input(s): CHOL, HDL, LDLCALC, TRIG, CHOLHDL, LDLDIRECT in the last 72 hours. Thyroid Function Tests: No results for input(s): TSH, T4TOTAL, FREET4, T3FREE, THYROIDAB in the last 72 hours. Anemia Panel: No results for input(s): VITAMINB12, FOLATE, FERRITIN, TIBC, IRON, RETICCTPCT in the last 72 hours. Urine analysis:    Component Value Date/Time   COLORURINE YELLOW 04/01/2018 0332   APPEARANCEUR CLEAR 04/01/2018 0332   LABSPEC 1.008 04/01/2018 0332   PHURINE 7.0 04/01/2018 0332   GLUCOSEU  NEGATIVE 04/01/2018 0332   GLUCOSEU NEG mg/dL 16/03/9603 5409   HGBUR NEGATIVE 04/01/2018 0332   BILIRUBINUR NEGATIVE 04/01/2018 0332   KETONESUR NEGATIVE 04/01/2018 0332   PROTEINUR NEGATIVE 04/01/2018 0332   UROBILINOGEN 0.2 08/08/2014 1445   NITRITE NEGATIVE 04/01/2018 0332   LEUKOCYTESUR NEGATIVE 04/01/2018 8119  Radiological Exams on Admission: Dg Chest Portable 1 View  Result Date: 04/01/2018 CLINICAL DATA:  Increasing shortness of breath. History of asthma, congestive heart failure, hypertension, former smoker. EXAM: PORTABLE CHEST 1 VIEW COMPARISON:  03/10/2018 FINDINGS: Cardiac enlargement with mild pulmonary vascular congestion. No definite edema or consolidation. No blunting of costophrenic angles. No pneumothorax. Mediastinal contours appear intact. IMPRESSION: Cardiac enlargement with mild pulmonary vascular congestion. No edema or consolidation. Electronically Signed   By: Burman Nieves M.D.   On: 04/01/2018 04:04   05/09/2017 echocardiogram ------------------------------------------------------------------- LV EF: 55%  ------------------------------------------------------------------- Indications:      CHF - 428.0.  ------------------------------------------------------------------- History:   PMH:   Dyspnea.  Atrial fibrillation.  Congestive heart failure.  Cardiomyopathy of unknown etiology.  PMH:   Myocardial infarction.  Risk factors:  Hypertension.  ------------------------------------------------------------------- Study Conclusions  - Left ventricle: The cavity size was normal. There was moderate   concentric hypertrophy. Systolic function was normal. The   estimated ejection fraction was 55%. There is akinesis of the   basalinferior myocardium. The study was not technically   sufficient to allow evaluation of LV diastolic dysfunction due to   atrial fibrillation. - Aortic valve: Mildly to moderately calcified annulus. Trileaflet;   mildly  thickened, mildly calcified leaflets. - Mitral valve: Calcified annulus. There was trivial regurgitation. - Tricuspid valve: There was trivial regurgitation.  EKG: Independently reviewed. Vent. rate 104 BPM PR interval * ms QRS duration 93 ms QT/QTc 370/443 ms P-R-T axes * 61 93 Atrial fibrillation Ventricular bigeminy Nonspecific repol abnormality, diffuse leads Not significantly changed when compared to previous.  Assessment/Plan Principal Problem:   Acute on chronic combined systolic and diastolic heart failure (HCC) Observation/telemetry. Continue supplemental oxygen. Furosemide 40 mg IVP every 8 hours. Potassium and magnesium supplementation. Monitor intake and output. Daily weights. Trend troponin levels. Check echocardiogram.  Active Problems:   Hyperlipidemia Continue atorvastatin 40 mg p.o. daily once confirmed by pharmacy.    Essential hypertension Continue lisinopril once dosage confirmed by pharmacy. Resume carvedilol in the evening if the patient is feeling better and does confirmed    GERD (gastroesophageal reflux disease) Famotidine 20 mg p.o. twice daily.    Hypokalemia Replacing. Follow-up potassium level.    Hypomagnesemia Replacing. Follow-up magnesium level as needed.    Atrial fibrillation, chronic CHA?DS?-VASc Score of at least 3. Continue Xarelto. Resume carvedilol later today in the evening.    OSA (obstructive sleep apnea) CPAP nightly.    DVT prophylaxis: On Xarelto. Code Status: Full code. Family Communication:  Disposition Plan: Observation for treatment and further evaluation of acute CHF. Consults called: Admission status: Observation/telemetry.   Bobette Mo MD [facility] Pager [phone].  If 7PM-7AM, please contact night-coverage [email or web address] Password [email or web address]  04/01/2018, 5:46 AM

## 2018-04-01 NOTE — ED Notes (Signed)
Pt refuses to ambulate at this time stating he is "really tired and too weak to walk right now."

## 2018-04-01 NOTE — Progress Notes (Signed)
*  PRELIMINARY RESULTS* Echocardiogram 2D Echocardiogram has been performed with Definity.  Stacey Drain 04/01/2018, 1:15 PM

## 2018-04-01 NOTE — Care Management Note (Signed)
Case Management Note  Patient Details  Name: SHUFORD BYWATERS MRN: [id number] Date of Birth: 1965-12-30  Subjective/Objective:  Admitted with CHF. Pt from home, lives with mother. Pt has home O2. Pt has PCP and cardiologist. Pt is noncompliant with diet and medications. Pt has been discharged from multiple [facility] agencies, [facility] and the [facility] para medicine program d/t non-compliance.                                  Action/Plan: DC home.   Expected Discharge Date:     04/03/2018             Expected Discharge Plan:  Home/Self Care  In-House Referral:     Discharge planning Services  CM Consult  Post Acute Care Choice:  NA Choice offered to:  NA  DME Arranged:    DME Agency:     HH Arranged:    HH Agency:     Status of Service:  Completed, signed off  If discussed at [employer] of Stay Meetings, dates discussed:    Additional Comments:  Rosella Crandell, [name], RN 04/01/2018, 1:45 PM

## 2018-04-01 NOTE — ED Triage Notes (Signed)
Pt in by [facility] for sob, placed on cpap en route with improvement.  Pt was initially 90% on ems arrival,

## 2018-04-01 NOTE — Care Management Important Message (Signed)
Important Message  Patient Details  Name: DIONE DONAIS MRN: [id number] Date of Birth: 09/26/1965   Medicare Important Message Given:       Renie Ora 04/01/2018, 2:43 PM

## 2018-04-02 DIAGNOSIS — I5041 Acute combined systolic (congestive) and diastolic (congestive) heart failure: Secondary | ICD-10-CM | POA: Diagnosis not present

## 2018-04-02 DIAGNOSIS — I5043 Acute on chronic combined systolic (congestive) and diastolic (congestive) heart failure: Secondary | ICD-10-CM | POA: Diagnosis not present

## 2018-04-02 LAB — BASIC METABOLIC PANEL
ANION GAP: 10 (ref 5–15)
BUN: 13 mg/dL (ref 6–20)
CHLORIDE: 104 mmol/L (ref 98–111)
CO2: 27 mmol/L (ref 22–32)
CREATININE: 1.15 mg/dL (ref 0.61–1.24)
Calcium: 8.1 mg/dL — ABNORMAL LOW (ref 8.9–10.3)
GFR calc non Af Amer: >60 mL/min (ref >60)
Glucose, Bld: 99 mg/dL (ref 70–99)
Potassium: 3 mmol/L — ABNORMAL LOW (ref 3.5–5.1)
SODIUM: 141 mmol/L (ref 135–145)

## 2018-04-02 LAB — MAGNESIUM: Magnesium: 2 mg/dL (ref 1.7–2.4)

## 2018-04-02 MED ORDER — FUROSEMIDE 10 MG/ML IJ SOLN
40.0000 mg | Freq: Two times a day (BID) | INTRAMUSCULAR | Status: DC
  Administered 2018-04-02 – 2018-04-03 (×2): 40 mg via INTRAVENOUS
  Filled 2018-04-02 (×2): qty 4

## 2018-04-02 MED ORDER — SPIRONOLACTONE 25 MG PO TABS
100.0000 mg | ORAL_TABLET | Freq: Every day | ORAL | Status: DC
  Administered 2018-04-02 – 2018-04-03 (×2): 100 mg via ORAL
  Filled 2018-04-02 (×2): qty 4

## 2018-04-02 NOTE — Progress Notes (Signed)
PROGRESS NOTE    [name]  ZOX:[id number] DOB: April 29, 1966 DOA: 04/01/2018 PCP: Samuel Grippe, MD   Brief Narrative:  Per HPI from [name] on 10/11[name] a 51 y.o.malewith medical history significant of allergic rhinitis, asthma, chronic atrial fibrillation, chronic combined systolic and diastolic heart failure, hypertension, GERD, history of MI with normal coronaries on cardiac cath in December 2016, nonischemic cardiomyopathy, OSA, osteoarthritis, PUD who is coming to the emergency department due to dyspnea. EMS describes that the patient was 90% on unspecified oxygen requirement, when they arrived. He was placed on CPAP ventilation, which improved his symptoms.  Per patient, he started getting progressively worse dyspnea since yesterday afternoon. He has been getting increased lower extremity edema for the past few days. He also complains of positional dizziness and palpitations. No chest pain, nausea, emesis, diaphoresis, PND or orthopnea. He denies dietary indiscretions. No fever, chills, sore throat, wheezing, hemoptysis, abdominal pain, diarrhea, constipation, melena or hematochezia. He denies dysuria, frequency or hematuria. No polyuria, polydipsia, polyphagia or blurred vision. No heat or cold intolerance. Denies skin pruritus.  He has been admitted with acute on chronic diastolic congestive heart failure and has had some hypokalemia and hypomagnesemia.  Assessment & Plan:   Principal Problem:   Acute on chronic combined systolic and diastolic heart failure (HCC) Active Problems:   Hyperlipidemia   Essential hypertension   GERD (gastroesophageal reflux disease)   Hypokalemia   Atrial fibrillation, chronic   OSA (obstructive sleep apnea)   Hypomagnesemia   1. Acute on chronic combined systolic and diastolic heart failure exacerbation.  Continue Lasix, but decrease frequency to twice daily instead of 3 times daily.  Start spironolactone that he was  using at home.  Continue oral potassium supplementation as otherwise ordered.  Recheck a.m. labs and monitor daily weights as well as input and output. 2. Hypokalemia.  Continue oral replacement and recheck labs in a.m. along with magnesium. 3. Hypomagnesemia.  Currently replaced and will recheck labs in a.m. 4. Dyslipidemia.  Continue atorvastatin 40 mg p.o. Daily. 5. GERD.  Continue famotidine 20 mg p.o. twice daily. 6. Chronic atrial fibrillation.  Continue on Xarelto as well as carvedilol.  Monitor on telemetry due to V. tach episode yesterday. 7. OSA.  Use CPAP at night.   DVT prophylaxis: Xarelto Code Status: Full Family Communication: None at bedside Disposition Plan: Continue IV diuresis and potassium supplementation with anticipated discharge likely in the next 24 hours   Consultants:   None  Procedures:   None  Antimicrobials:   None   Subjective: Patient seen and evaluated today with no new acute complaints or concerns. No acute concerns or events noted overnight. He states he is feeling better this morning.  Objective: Vitals:   04/01/18 2104 04/02/18 0059 04/02/18 0542 04/02/18 0839  BP: 114/81  134/83 (!) 105/54  Pulse: 69  84 70  Resp: 18  20   Temp: 99.2 F (37.3 C)  99 F (37.2 C)   TempSrc: Oral  Oral   SpO2: 100%  99%   Weight:  (!) 170.4 kg (!) 169.9 kg     Intake/Output Summary (Last 24 hours) at 04/02/2018 0929 Last data filed at 04/02/2018 0800 Gross per 24 hour  Intake 744.17 ml  Output 2925 ml  Net -2180.83 ml   Filed Weights   04/02/18 0059 04/02/18 0542  Weight: (!) 170.4 kg (!) 169.9 kg    Examination:  General exam: Appears calm and comfortable  Respiratory system: Clear to  auscultation. Respiratory effort normal. Cardiovascular system: S1 & S2 heard, RRR. No JVD, murmurs, rubs, gallops or clicks. No pedal edema. Gastrointestinal system: Abdomen is nondistended, soft and nontender. No organomegaly or masses felt. Normal bowel  sounds heard. Central nervous system: Alert and oriented. No focal neurological deficits. Extremities: Symmetric 5 x 5 power. Skin: No rashes, lesions or ulcers Psychiatry: Judgement and insight appear normal. Mood & affect appropriate.     Data Reviewed: I have personally reviewed following labs and imaging studies  CBC: Recent Labs  Lab 04/01/18 0331  WBC 6.7  NEUTROABS 4.8  HGB 13.3  HCT 42.1  MCV 88.8  PLT 194   Basic Metabolic Panel: Recent Labs  Lab 04/01/18 0331 04/01/18 0532 04/01/18 1542 04/02/18 0635  NA 139  --  138 141  K 2.9*  --  2.9* 3.0*  CL 100  --  101 104  CO2 31  --  26 27  GLUCOSE 100*  --  134* 99  BUN 13  --  12 13  CREATININE 1.26*  --  1.13 1.15  CALCIUM 8.5*  --  8.1* 8.1*  MG  --  1.6* 2.2 2.0  PHOS  --  3.5  --   --    GFR: Estimated Creatinine Clearance: 125.8 mL/min (by C-G formula based on SCr of 1.15 mg/dL). Liver Function Tests: Recent Labs  Lab 04/01/18 0331  AST 21  ALT 16  ALKPHOS 66  BILITOT 1.6*  PROT 8.0  ALBUMIN 3.9   No results for input(s): LIPASE, AMYLASE in the last 168 hours. No results for input(s): AMMONIA in the last 168 hours. Coagulation Profile: No results for input(s): INR, PROTIME in the last 168 hours. Cardiac Enzymes: Recent Labs  Lab 04/01/18 0331 04/01/18 0956 04/01/18 1542 04/01/18 2135  TROPONINI 0.03* 0.03* 0.03* <0.03   BNP (last 3 results) No results for input(s): PROBNP in the last 8760 hours. HbA1C: No results for input(s): HGBA1C in the last 72 hours. CBG: No results for input(s): GLUCAP in the last 168 hours. Lipid Profile: No results for input(s): CHOL, HDL, LDLCALC, TRIG, CHOLHDL, LDLDIRECT in the last 72 hours. Thyroid Function Tests: No results for input(s): TSH, T4TOTAL, FREET4, T3FREE, THYROIDAB in the last 72 hours. Anemia Panel: No results for input(s): VITAMINB12, FOLATE, FERRITIN, TIBC, IRON, RETICCTPCT in the last 72 hours. Sepsis Labs: No results for input(s):  PROCALCITON, LATICACIDVEN in the last 168 hours.  No results found for this or any previous visit (from the past 240 hour(s)).       Radiology Studies: Dg Chest Portable 1 View  Result Date: 04/01/2018 CLINICAL DATA:  Increasing shortness of breath. History of asthma, congestive heart failure, hypertension, former smoker. EXAM: PORTABLE CHEST 1 VIEW COMPARISON:  03/10/2018 FINDINGS: Cardiac enlargement with mild pulmonary vascular congestion. No definite edema or consolidation. No blunting of costophrenic angles. No pneumothorax. Mediastinal contours appear intact. IMPRESSION: Cardiac enlargement with mild pulmonary vascular congestion. No edema or consolidation. Electronically Signed   By: Burman Nieves M.D.   On: 04/01/2018 04:04        Scheduled Meds: . carvedilol  37.5 mg Oral BID WC  . famotidine  20 mg Oral BID  . furosemide  40 mg Intravenous Q12H  . magnesium oxide  400 mg Oral BID  . metoprolol tartrate  5 mg Intravenous Once  . potassium chloride  40 mEq Oral BID  . spironolactone  100 mg Oral Daily   Continuous Infusions:   LOS: 0 days  Time spent: 30 minutes    Samuel Beaulac Hoover Brunette, DO [facility] Pager [phone]  If 7PM-7AM, please contact night-coverage [email or web address] Password [email or web address] 04/02/2018, 9:29 AM

## 2018-04-03 DIAGNOSIS — I5041 Acute combined systolic (congestive) and diastolic (congestive) heart failure: Secondary | ICD-10-CM | POA: Diagnosis not present

## 2018-04-03 DIAGNOSIS — I5043 Acute on chronic combined systolic (congestive) and diastolic (congestive) heart failure: Secondary | ICD-10-CM | POA: Diagnosis not present

## 2018-04-03 LAB — MAGNESIUM: Magnesium: 2.3 mg/dL (ref 1.7–2.4)

## 2018-04-03 LAB — BASIC METABOLIC PANEL
Anion gap: 8 (ref 5–15)
BUN: 16 mg/dL (ref 6–20)
CALCIUM: 8.8 mg/dL — AB (ref 8.9–10.3)
CHLORIDE: 105 mmol/L (ref 98–111)
CO2: 28 mmol/L (ref 22–32)
Creatinine, Ser: 1.21 mg/dL (ref 0.61–1.24)
GFR calc non Af Amer: >60 mL/min (ref >60)
Glucose, Bld: 94 mg/dL (ref 70–99)
Potassium: 3.6 mmol/L (ref 3.5–5.1)
Sodium: 141 mmol/L (ref 135–145)

## 2018-04-03 NOTE — Progress Notes (Signed)
Patient is to be discharged home and in stable condition. Patient's IV and telemetry removed, WNL. Patient given discharge instructions and verbalized understanding. All questions addressed and answered. Patient to be escorted out by staff via wheelchair upon arrival of transportation.  Quita Skye, RN

## 2018-04-03 NOTE — Progress Notes (Signed)
SATURATION QUALIFICATIONS: (This note is used to comply with regulatory documentation for home oxygen)  Patient Saturations on Room Air at Rest = 97%  Patient Saturations on Room Air while Ambulating = 88%  Patient Saturations on 4 Liters of oxygen while Ambulating = 95%  Please briefly explain why patient needs home oxygen: Patient unable to maintain oxygen saturation without oxygen.  Quita Skye, RN

## 2018-04-03 NOTE — Progress Notes (Signed)
Spoke w patient over the phone, he confirms he has home oxygen. He states that his son will be providing transportation home and will bring his home portable oxygen with him for use at DC. No other CM needs. Signing off

## 2018-04-03 NOTE — Discharge Summary (Signed)
Physician Discharge Summary  [name] EXB:[id number] DOB: 01-15-1966 DOA: 04/01/2018  PCP: Pearson Grippe, MD  Admit date: 04/01/2018  Discharge date: 04/03/2018  Admitted From:Home  Disposition:  Home  Recommendations for Outpatient Follow-up:  1. Follow up with PCP in 1-2 weeks 2. Please obtain BMP in one week  Home Health:N/A  Equipment/Devices:None; has home O2  Discharge Condition:Stable  CODE STATUS: Full  Diet recommendation: Heart Healthy  Brief/Interim Summary: Per HPI from Dr. Robb Matar on 10/11[name] Archeris a 51 y.o.malewith medical history significant of allergic rhinitis, asthma, chronic atrial fibrillation, chronic combined systolic and diastolic heart failure, hypertension, GERD, history of MI with normal coronaries on cardiac cath in December 2016, nonischemic cardiomyopathy, OSA, osteoarthritis, PUD who is coming to the emergency department due to dyspnea. EMS describes that the patient was 90% on unspecified oxygen requirement, when they arrived. He was placed on CPAP ventilation, which improved his symptoms.  Per patient, he started getting progressively worse dyspnea since yesterday afternoon. He has been getting increased lower extremity edema for the past few days. He also complains of positional dizziness and palpitations. No chest pain, nausea, emesis, diaphoresis, PND or orthopnea. He denies dietary indiscretions. No fever, chills, sore throat, wheezing, hemoptysis, abdominal pain, diarrhea, constipation, melena or hematochezia. He denies dysuria, frequency or hematuria. No polyuria, polydipsia, polyphagia or blurred vision. No heat or cold intolerance. Denies skin pruritus.  He had been admitted with acute on chronic diastolic congestive heart failure and has had some hypokalemia and hypomagnesemia.  2D echo was performed while here on 10/11 with LVEF of 55%.  He has diuresed quite well and is feeling back to his usual baseline.  No further  hypomagnesemia or hypokalemia noted.  No further acute events during this hospitalization.  He will require repeat BMP in the outpatient setting in 1 week to ensure that electrolytes are stable with his usual home medications.  Continue use of CPAP at night for OSA.  Discharge Diagnoses:  Principal Problem:   Acute on chronic combined systolic and diastolic heart failure (HCC) Active Problems:   Hyperlipidemia   Essential hypertension   GERD (gastroesophageal reflux disease)   Hypokalemia   Atrial fibrillation, chronic   OSA (obstructive sleep apnea)   Hypomagnesemia    Discharge Instructions  Discharge Instructions    Diet - low sodium heart healthy   Complete by:  As directed    Increase activity slowly   Complete by:  As directed      Allergies as of 04/03/2018      Reactions   Banana Shortness Of Breath   Bee Venom Shortness Of Breath, Swelling, Other (See Comments)   Reaction:  Facial swelling   Food Shortness Of Breath, Rash, Other (See Comments)   Pt states that he is allergic to strawberries.     Aspirin Other (See Comments)   Reaction:  GI upset    Metolazone Other (See Comments)   Pt states that he stopped taking this med due to heart attack like symptoms.    Oatmeal Nausea And Vomiting   Orange Juice [orange Oil] Nausea And Vomiting   All acidic products make him nauseous and upset stomach   Torsemide Swelling, Other (See Comments)   Reaction:  Swelling of feet/legs    Diltiazem Palpitations   Hydralazine Palpitations   Lipitor [atorvastatin] Other (See Comments)   Reaction:  Nose bleeds       Medication List    TAKE these medications   albuterol (2.5 MG/3ML)  0.083% nebulizer solution Commonly known as:  PROVENTIL Take 2.5 mg by nebulization every 6 (six) hours as needed for wheezing or shortness of breath.   aspirin 81 MG chewable tablet Chew 1 tablet (81 mg total) by mouth daily. What changed:  when to take this   atorvastatin 40 MG  tablet Commonly known as:  LIPITOR Take 1 tablet (40 mg total) by mouth daily at 6 PM.   beclomethasone 80 MCG/ACT inhaler Commonly known as:  QVAR Inhale 2 puffs into the lungs 2 (two) times daily.   bumetanide 2 MG tablet Commonly known as:  BUMEX Take 2 mg by mouth 2 (two) times daily.   carvedilol 25 MG tablet Commonly known as:  COREG Take 1.5 tablets (37.5 mg total) by mouth 2 (two) times daily with a meal.   docusate sodium 100 MG capsule Commonly known as:  COLACE Take 1 capsule (100 mg total) by mouth 2 (two) times daily. What changed:    when to take this  reasons to take this   doxycycline 100 MG capsule Commonly known as:  MONODOX Take 1 capsule by mouth 2 (two) times daily.   ferrous gluconate 324 MG tablet Commonly known as:  FERGON Take 1 tablet (324 mg total) by mouth 2 (two) times daily with a meal. What changed:  when to take this   FLOVENT HFA 220 MCG/ACT inhaler Generic drug:  fluticasone Inhale 1-2 puffs into the lungs every 4 (four) hours as needed.   lisinopril 5 MG tablet Commonly known as:  PRINIVIL,ZESTRIL Take 1 tablet (5 mg total) by mouth daily.   OXYGEN Inhale 3.5 L into the lungs daily.   pantoprazole 40 MG tablet Commonly known as:  PROTONIX Take 1 tablet (40 mg total) by mouth daily at 6 (six) AM. What changed:  when to take this   potassium chloride SA 20 MEQ tablet Commonly known as:  K-DUR,KLOR-CON Take 1 tablet (20 mEq total) by mouth daily.   rivaroxaban 20 MG Tabs tablet Commonly known as:  XARELTO Take 1 tablet (20 mg total) by mouth daily with breakfast. What changed:  when to take this   spironolactone 100 MG tablet Commonly known as:  ALDACTONE Take 100 mg by mouth daily. For fluid   tamsulosin 0.4 MG Caps capsule Commonly known as:  FLOMAX Take 1 capsule (0.4 mg total) by mouth daily.      Follow-up Information    Pearson Grippe, MD Follow up.   Specialty:  Internal Medicine Why:  Repeat BMET in 1  week. Contact information: [address] [place] [address] [phone]        [name], MD .   Specialty:  Cardiology Contact information: [address] [place] [address] [phone]          Allergies  Allergen Reactions  . Banana Shortness Of Breath  . Bee Venom Shortness Of Breath, Swelling and Other (See Comments)    Reaction:  Facial swelling  . Food Shortness Of Breath, Rash and Other (See Comments)    Pt states that he is allergic to strawberries.    . Aspirin Other (See Comments)    Reaction:  GI upset   . Metolazone Other (See Comments)    Pt states that he stopped taking this med due to heart attack like symptoms.   . Oatmeal Nausea And Vomiting  . Orange Juice [Orange Oil] Nausea And Vomiting    All acidic products make him nauseous and  upset stomach  . Torsemide Swelling and Other (See Comments)    Reaction:  Swelling of feet/legs   . Diltiazem Palpitations  . Hydralazine Palpitations  . Lipitor [Atorvastatin] Other (See Comments)    Reaction:  Nose bleeds     Consultations:  None   Procedures/Studies: Dg Chest 2 View  Result Date: 03/11/2018 CLINICAL DATA:  Weakness and dyspnea x1 week. EXAM: CHEST - 2 VIEW COMPARISON:  None. FINDINGS: Stable cardiomegaly and mediastinal contours. Mild to moderate central pulmonary vascular congestion. No acute osseous abnormality. IMPRESSION: Cardiomegaly with stigmata of mild CHF. Electronically Signed   By: Tollie Eth M.D.   On: 03/11/2018 00:22   Dg Chest Portable 1 View  Result Date: 04/01/2018 CLINICAL DATA:  Increasing shortness of breath. History of asthma, congestive heart failure, hypertension, former smoker. EXAM: PORTABLE CHEST 1 VIEW COMPARISON:  03/10/2018 FINDINGS: Cardiac enlargement with mild pulmonary vascular congestion. No definite edema or consolidation. No blunting of costophrenic angles. No pneumothorax. Mediastinal contours appear intact. IMPRESSION:  Cardiac enlargement with mild pulmonary vascular congestion. No edema or consolidation. Electronically Signed   By: Burman Nieves M.D.   On: 04/01/2018 04:04     Discharge Exam: Vitals:   04/03/18 0545 04/03/18 0823  BP: (!) 119/93 94/77  Pulse: (!) 107 (!) 55  Resp: 18   Temp: 98.6 F (37 C)   SpO2: 100%    Vitals:   04/02/18 1348 04/02/18 2244 04/03/18 0545 04/03/18 0823  BP: 99/63 (!) 109/97 (!) 119/93 94/77  Pulse: 70 (!) 53 (!) 107 (!) 55  Resp: 20 18 18    Temp: 98.4 F (36.9 C) 98.2 F (36.8 C) 98.6 F (37 C)   TempSrc: Oral Oral Oral   SpO2: 100% 100% 100%   Weight:   (!) 168.3 kg     General: Pt is alert, awake, not in acute distress Cardiovascular: RRR, S1/S2 +, no rubs, no gallops Respiratory: CTA bilaterally, no wheezing, no rhonchi; wears 3.5 to 4 L nasal cannula oxygen chronically Abdominal: Soft, NT, ND, bowel sounds + Extremities: no edema, no cyanosis    The results of significant diagnostics from this hospitalization (including imaging, microbiology, ancillary and laboratory) are listed below for reference.     Microbiology: No results found for this or any previous visit (from the past 240 hour(s)).   Labs: BNP (last 3 results) Recent Labs    09/08/17 1340 03/10/18 2335 04/01/18 0331  BNP 233.0* 95.0 115.0*   Basic Metabolic Panel: Recent Labs  Lab 04/01/18 0331 04/01/18 0532 04/01/18 1542 04/02/18 0635 04/03/18 0612  NA 139  --  138 141 141  K 2.9*  --  2.9* 3.0* 3.6  CL 100  --  101 104 105  CO2 31  --  26 27 28   GLUCOSE 100*  --  134* 99 94  BUN 13  --  12 13 16   CREATININE 1.26*  --  1.13 1.15 1.21  CALCIUM 8.5*  --  8.1* 8.1* 8.8*  MG  --  1.6* 2.2 2.0 2.3  PHOS  --  3.5  --   --   --    Liver Function Tests: Recent Labs  Lab 04/01/18 0331  AST 21  ALT 16  ALKPHOS 66  BILITOT 1.6*  PROT 8.0  ALBUMIN 3.9   No results for input(s): LIPASE, AMYLASE in the last 168 hours. No results for input(s): AMMONIA in the  last 168 hours. CBC: Recent Labs  Lab 04/01/18 0331  WBC 6.7  NEUTROABS 4.8  HGB 13.3  HCT 42.1  MCV 88.8  PLT 194   Cardiac Enzymes: Recent Labs  Lab 04/01/18 0331 04/01/18 0956 04/01/18 1542 04/01/18 2135  TROPONINI 0.03* 0.03* 0.03* <0.03   BNP: Invalid input(s): POCBNP CBG: No results for input(s): GLUCAP in the last 168 hours. D-Dimer No results for input(s): DDIMER in the last 72 hours. Hgb A1c No results for input(s): HGBA1C in the last 72 hours. Lipid Profile No results for input(s): CHOL, HDL, LDLCALC, TRIG, CHOLHDL, LDLDIRECT in the last 72 hours. Thyroid function studies No results for input(s): TSH, T4TOTAL, T3FREE, THYROIDAB in the last 72 hours.  Invalid input(s): FREET3 Anemia work up No results for input(s): VITAMINB12, FOLATE, FERRITIN, TIBC, IRON, RETICCTPCT in the last 72 hours. Urinalysis    Component Value Date/Time   COLORURINE YELLOW 04/01/2018 0332   APPEARANCEUR CLEAR 04/01/2018 0332   LABSPEC 1.008 04/01/2018 0332   PHURINE 7.0 04/01/2018 0332   GLUCOSEU NEGATIVE 04/01/2018 0332   GLUCOSEU NEG mg/dL 56/38/7564 3329   HGBUR NEGATIVE 04/01/2018 0332   BILIRUBINUR NEGATIVE 04/01/2018 0332   KETONESUR NEGATIVE 04/01/2018 0332   PROTEINUR NEGATIVE 04/01/2018 0332   UROBILINOGEN 0.2 08/08/2014 1445   NITRITE NEGATIVE 04/01/2018 0332   LEUKOCYTESUR NEGATIVE 04/01/2018 0332   Sepsis Labs Invalid input(s): PROCALCITONIN,  WBC,  LACTICIDVEN Microbiology No results found for this or any previous visit (from the past 240 hour(s)).   Time coordinating discharge: 35 minutes  SIGNED:   Erick Blinks, DO [facility] 04/03/2018, 10:11 AM Pager [phone]  If 7PM-7AM, please contact night-coverage [email or web address] Password [email or web address]

## 2018-04-04 LAB — BLOOD GAS, ARTERIAL
ACID-BASE EXCESS: 5 mmol/L — AB (ref 0.0–2.0)
BICARBONATE: 29 mmol/L — AB (ref 20.0–28.0)
Drawn by: 2223
O2 Content: 3 L/min
O2 Saturation: 98.7 %
PCO2 ART: 40.4 mmHg (ref 32.0–48.0)
PH ART: 7.466 — AB (ref 7.350–7.450)
pO2, Arterial: 120 mmHg — ABNORMAL HIGH (ref 83.0–108.0)

## 2018-04-04 LAB — GLUCOSE, CAPILLARY: GLUCOSE-CAPILLARY: 108 mg/dL — AB (ref 70–99)

## 2018-04-08 DIAGNOSIS — I482 Chronic atrial fibrillation, unspecified: Secondary | ICD-10-CM | POA: Diagnosis not present

## 2018-04-08 DIAGNOSIS — I5042 Chronic combined systolic (congestive) and diastolic (congestive) heart failure: Secondary | ICD-10-CM | POA: Diagnosis not present

## 2018-04-08 DIAGNOSIS — I110 Hypertensive heart disease with heart failure: Secondary | ICD-10-CM | POA: Diagnosis not present

## 2018-04-08 DIAGNOSIS — E876 Hypokalemia: Secondary | ICD-10-CM | POA: Diagnosis not present

## 2018-04-10 DIAGNOSIS — I5043 Acute on chronic combined systolic (congestive) and diastolic (congestive) heart failure: Secondary | ICD-10-CM | POA: Diagnosis not present

## 2018-04-20 IMAGING — CR DG CHEST 1V PORT
1 series · 1 of 1 positions shown · non-contrast
Comparison: Chest radiograph 08/05/2016

CLINICAL DATA: Shortness of breath

EXAM:
PORTABLE CHEST 1 VIEW

[portable]
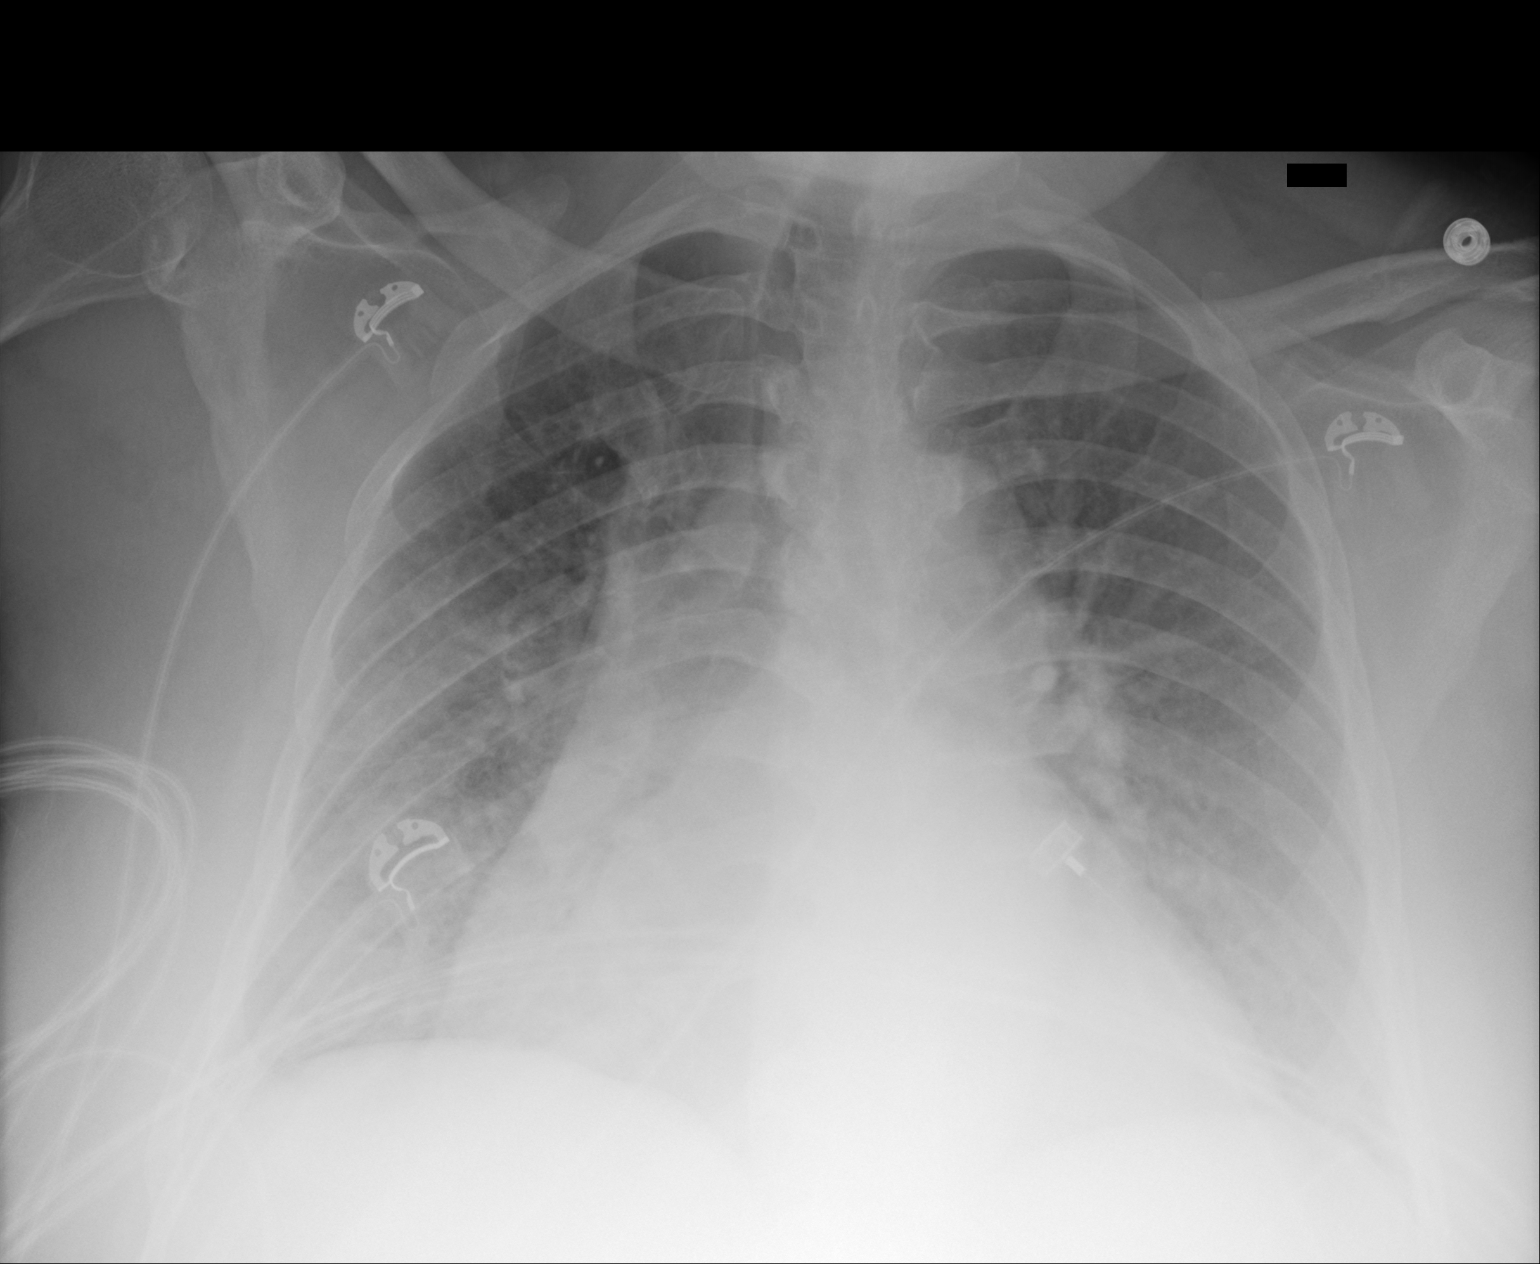

[1 of 1 positions shown; findings below may reference images not displayed]

FINDINGS: Cardiomediastinal silhouette remains massively enlarged. There is
mild pulmonary edema and small bilateral pleural effusions.
IMPRESSION: Congestive heart failure with mild pulmonary edema, small pleural
effusions and cardiomegaly versus pericardial effusion.

## 2018-04-25 IMAGING — CR DG CHEST 1V PORT
1 series · 1 of 1 positions shown · non-contrast
Comparison: Multiple prior exams most recently 08/13/2016

CLINICAL DATA: Shortness of breath.

EXAM:
PORTABLE CHEST 1 VIEW

[portable]
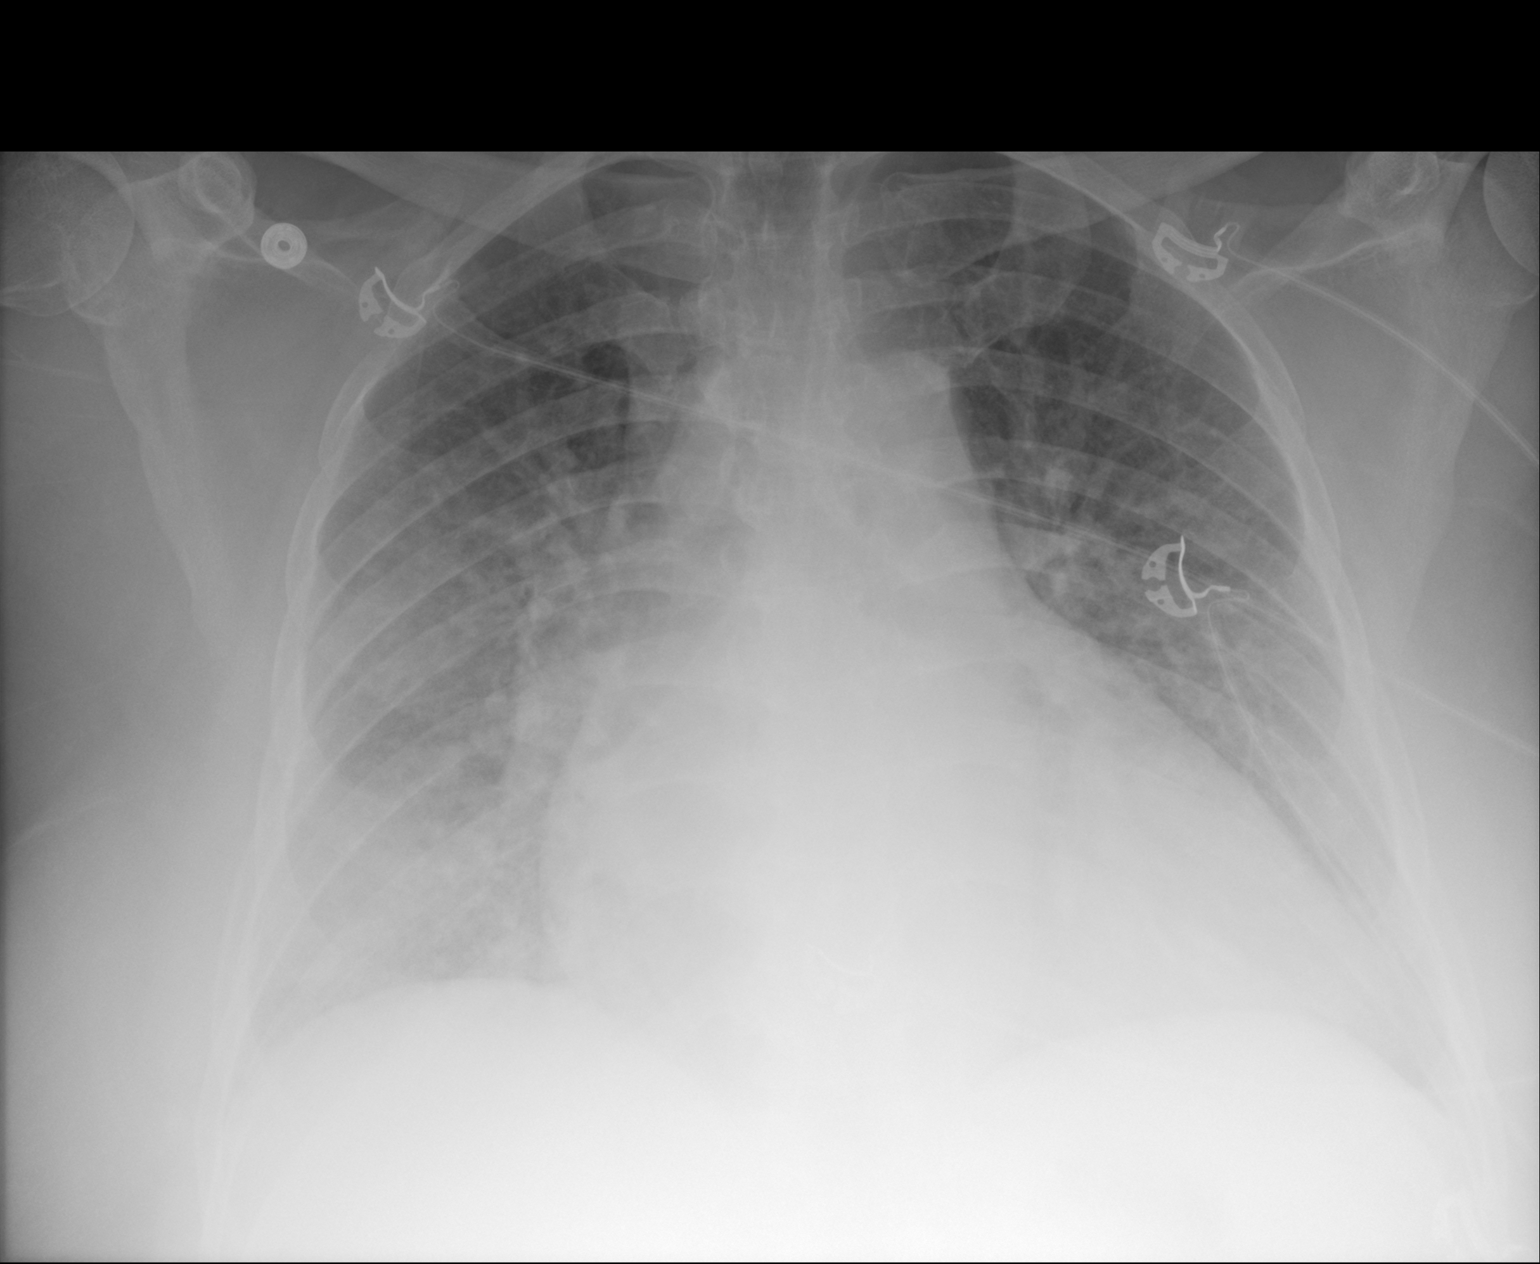

[1 of 1 positions shown; findings below may reference images not displayed]

FINDINGS: Cardiomegaly is again seen. Vascular congestion. There is increased
perihilar edema. Question of small pleural effusions versus habitus.
No confluent airspace disease.
IMPRESSION: CHF with increased perihilar edema from recent prior.

## 2018-05-03 DIAGNOSIS — E876 Hypokalemia: Secondary | ICD-10-CM | POA: Diagnosis not present

## 2018-05-03 DIAGNOSIS — I110 Hypertensive heart disease with heart failure: Secondary | ICD-10-CM | POA: Diagnosis not present

## 2018-05-04 DIAGNOSIS — E876 Hypokalemia: Secondary | ICD-10-CM | POA: Diagnosis not present

## 2018-05-04 DIAGNOSIS — Z0001 Encounter for general adult medical examination with abnormal findings: Secondary | ICD-10-CM | POA: Diagnosis not present

## 2018-05-04 DIAGNOSIS — Z79899 Other long term (current) drug therapy: Secondary | ICD-10-CM | POA: Diagnosis not present

## 2018-05-04 DIAGNOSIS — R600 Localized edema: Secondary | ICD-10-CM | POA: Diagnosis not present

## 2018-05-04 DIAGNOSIS — I5022 Chronic systolic (congestive) heart failure: Secondary | ICD-10-CM | POA: Diagnosis not present

## 2018-05-07 IMAGING — DX DG CHEST 2V
2 series · 2 of 2 positions shown · non-contrast
Comparison: Prior radiograph from 08/18/2016.

CLINICAL DATA: Initial evaluation for acute shortness of breath.

EXAM:
CHEST  2 VIEW

[chest pa]
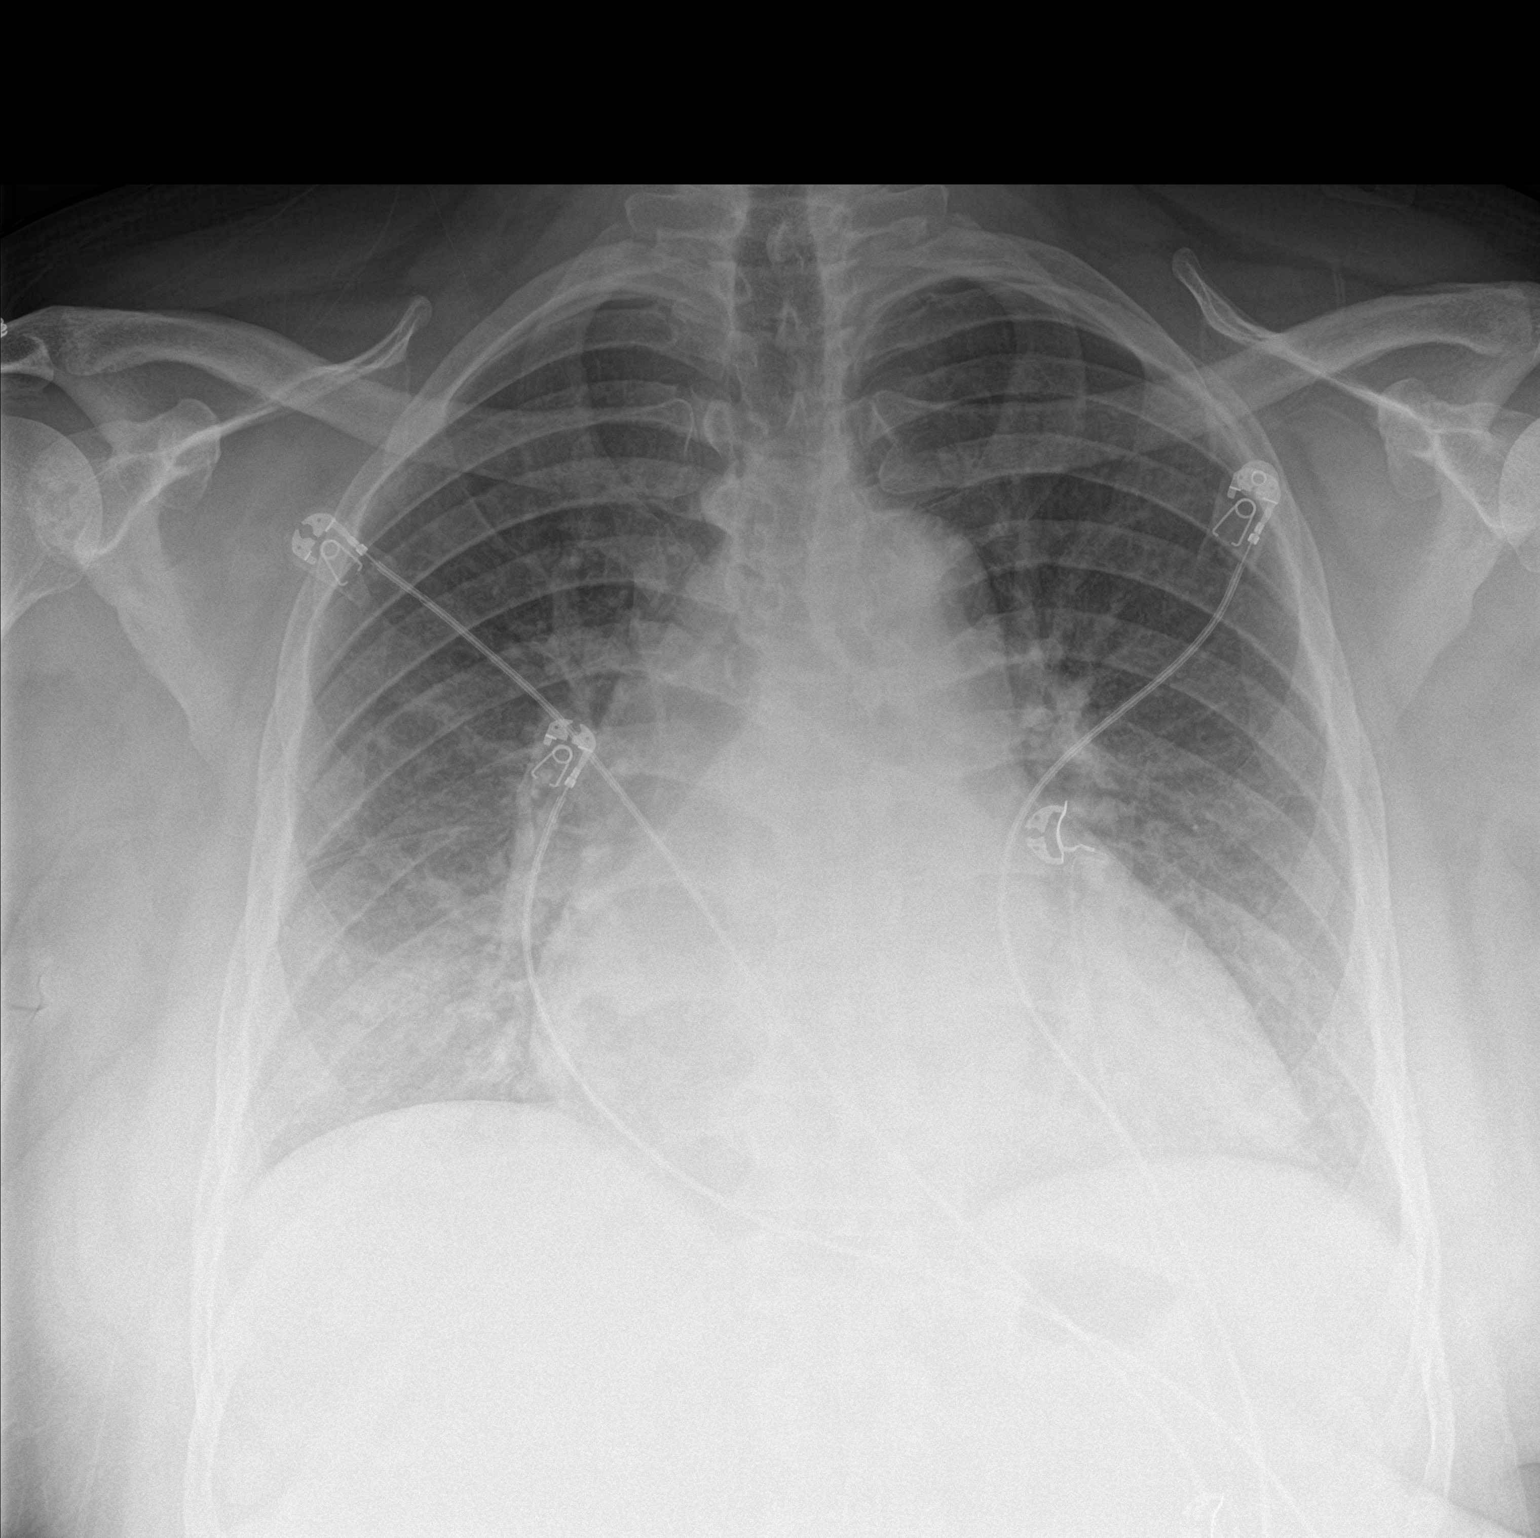

[chest lat]
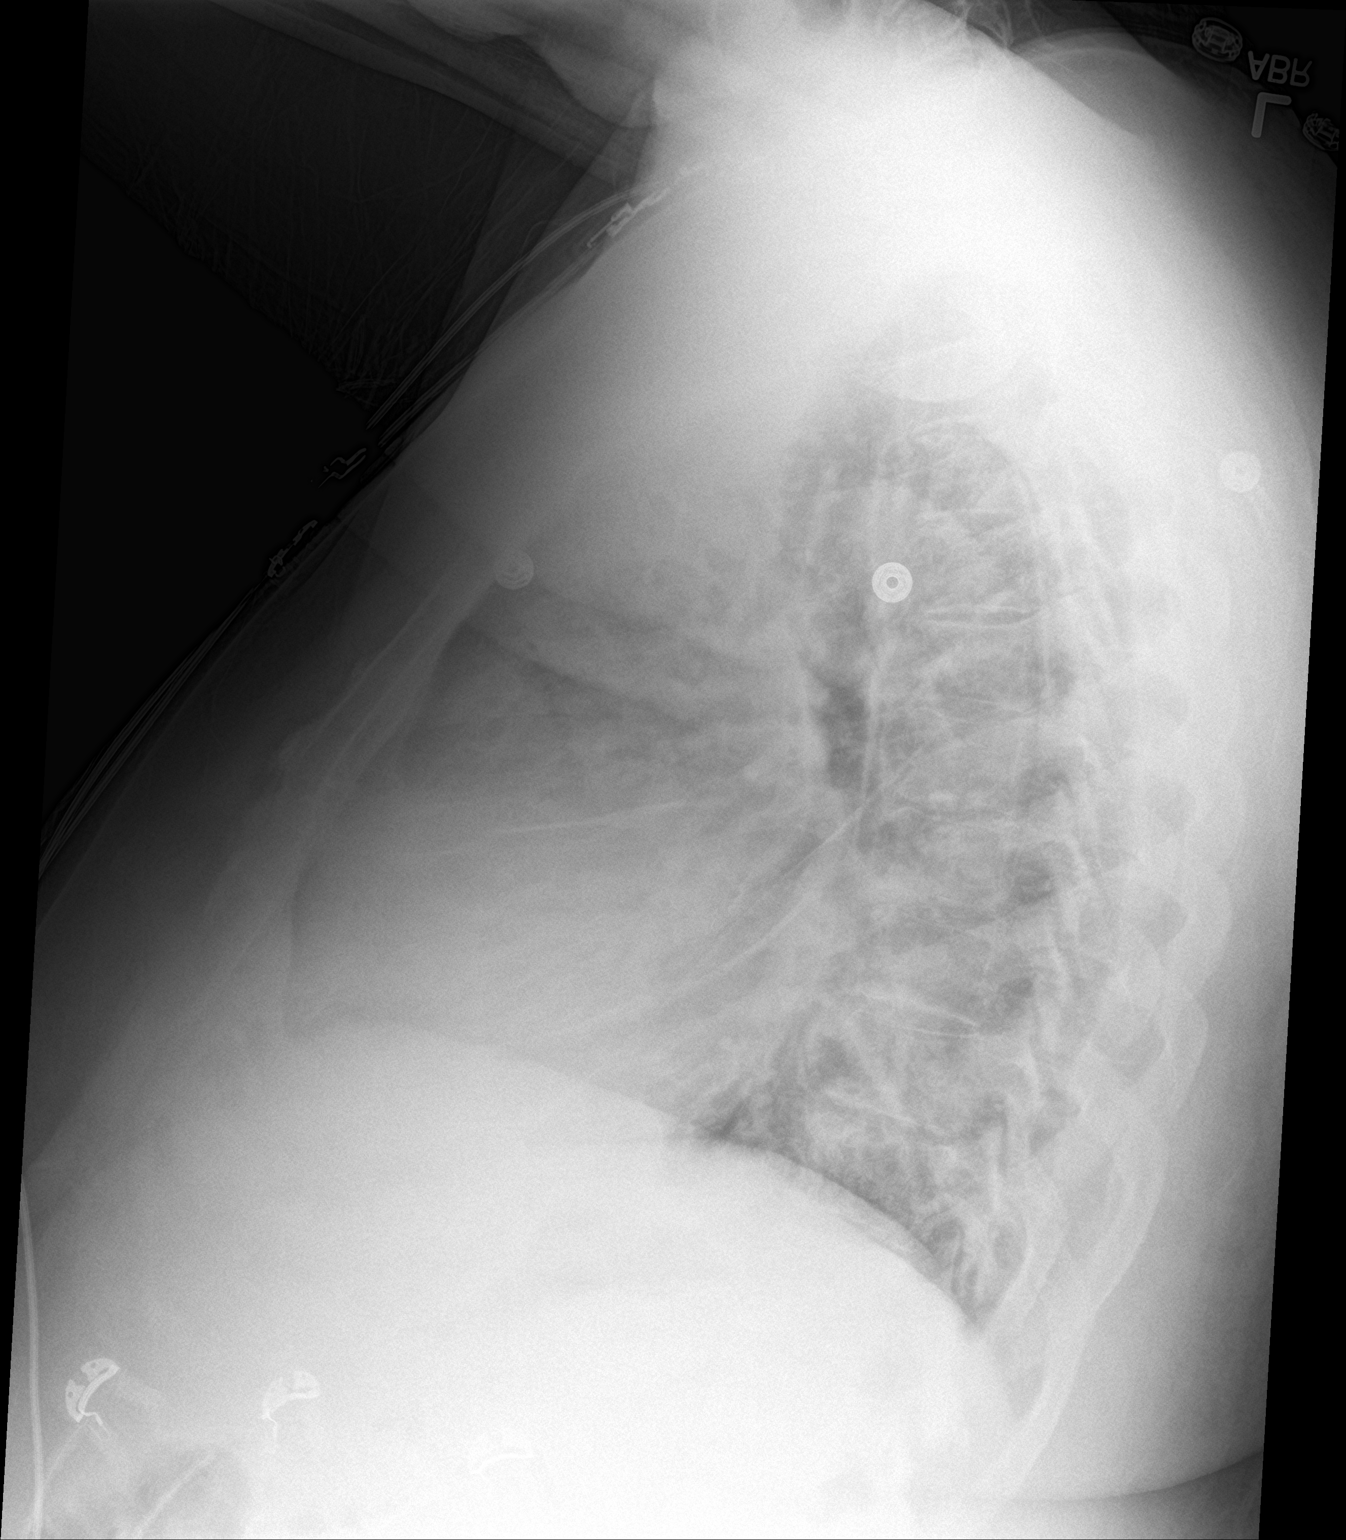

[2 of 2 positions shown; findings below may reference images not displayed]

FINDINGS: Moderate cardiomegaly, stable from previous. Mediastinal silhouette
within normal limits.

Lungs are normally inflated. Diffuse pulmonary vascular congestion
with mild interstitial prominence, suggesting mild pulmonary
interstitial edema. No pleural effusion. No superimposed focal
infiltrates identified. No pneumothorax.

No acute osseus abnormality.
IMPRESSION: Cardiomegaly with mild diffuse pulmonary interstitial edema,
compatible with CHF.

## 2018-05-11 DIAGNOSIS — I5043 Acute on chronic combined systolic (congestive) and diastolic (congestive) heart failure: Secondary | ICD-10-CM | POA: Diagnosis not present

## 2018-05-11 IMAGING — DX DG CHEST 2V
3 series · 3 of 3 positions shown · non-contrast
Comparison: Portable chest x-ray September 01, 2016

CLINICAL DATA: Peripheral edema, shortness of breath for the past
3-4 days. History of cardiomyopathy, asthma, obesity, medication
noncompliance, former smoker.

EXAM:
CHEST  2 VIEW

[chest lat (1 of 2)]
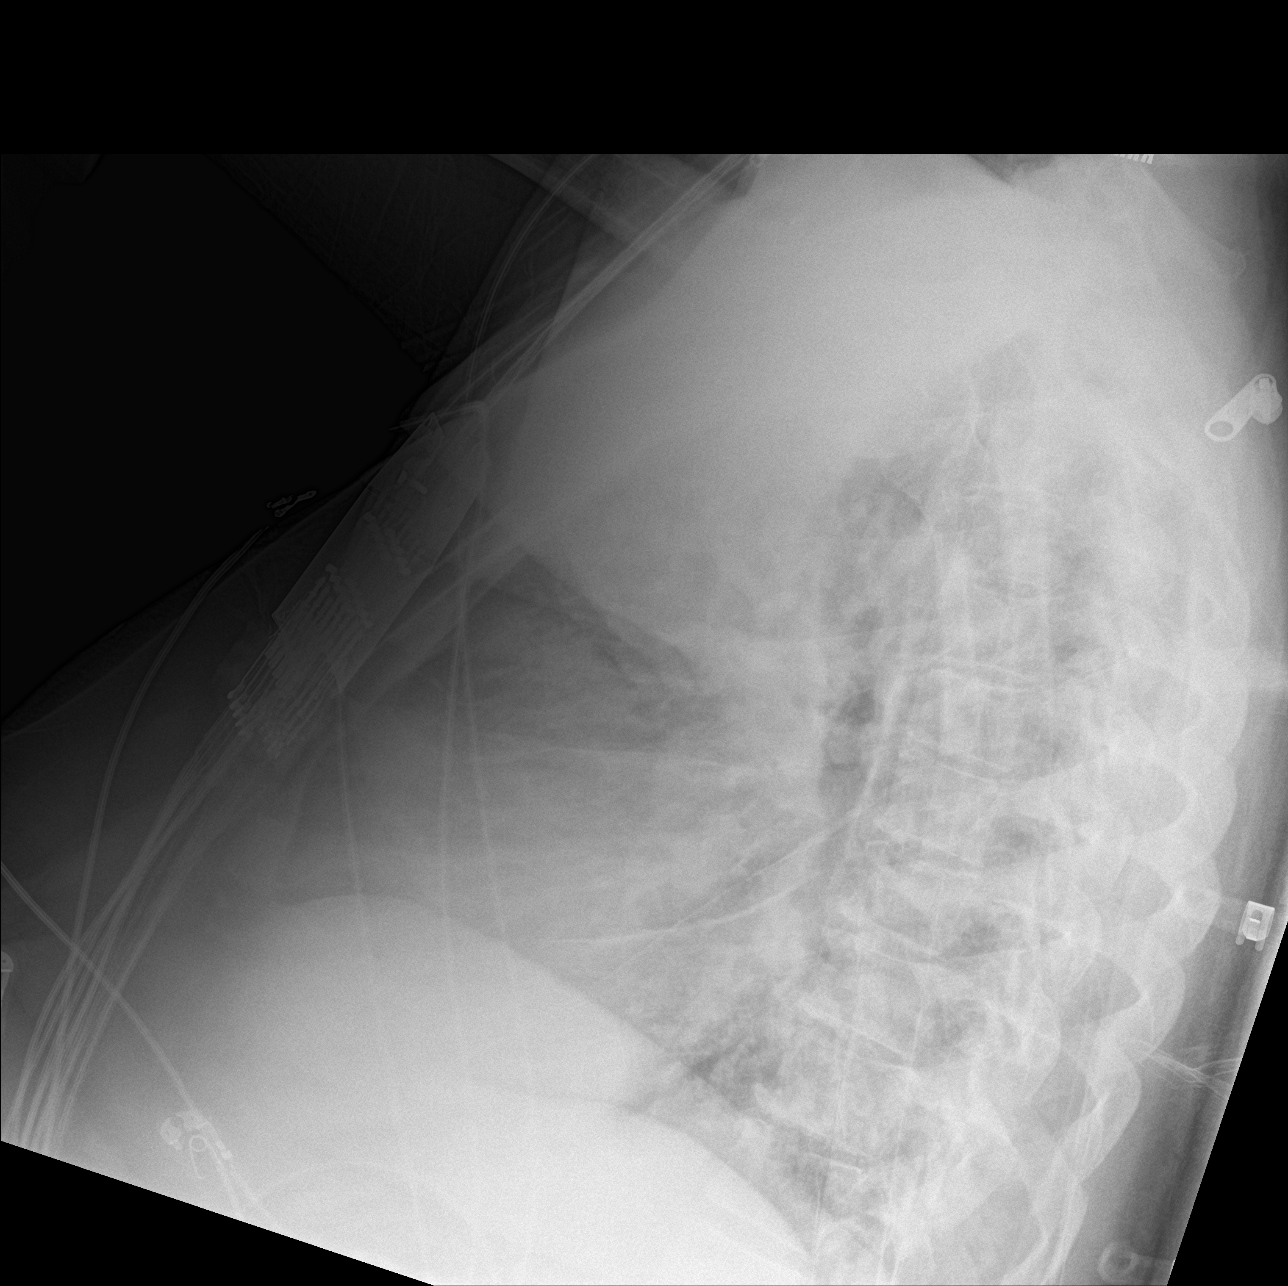

[chest ap]
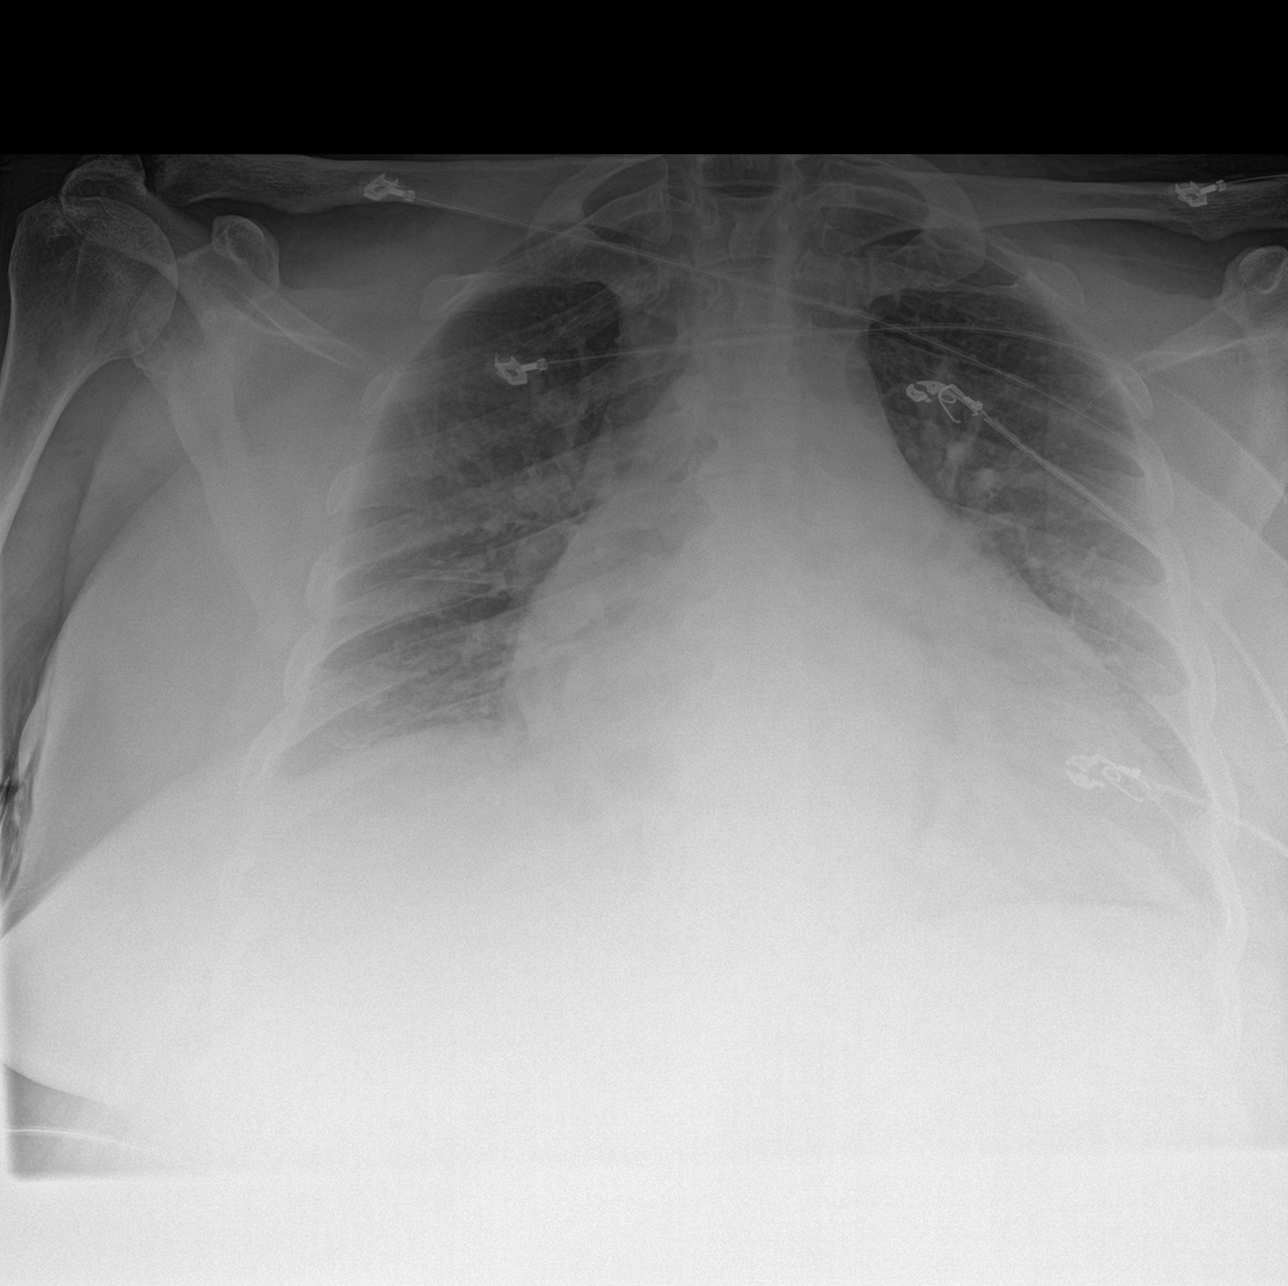

[chest lat (2 of 2)]
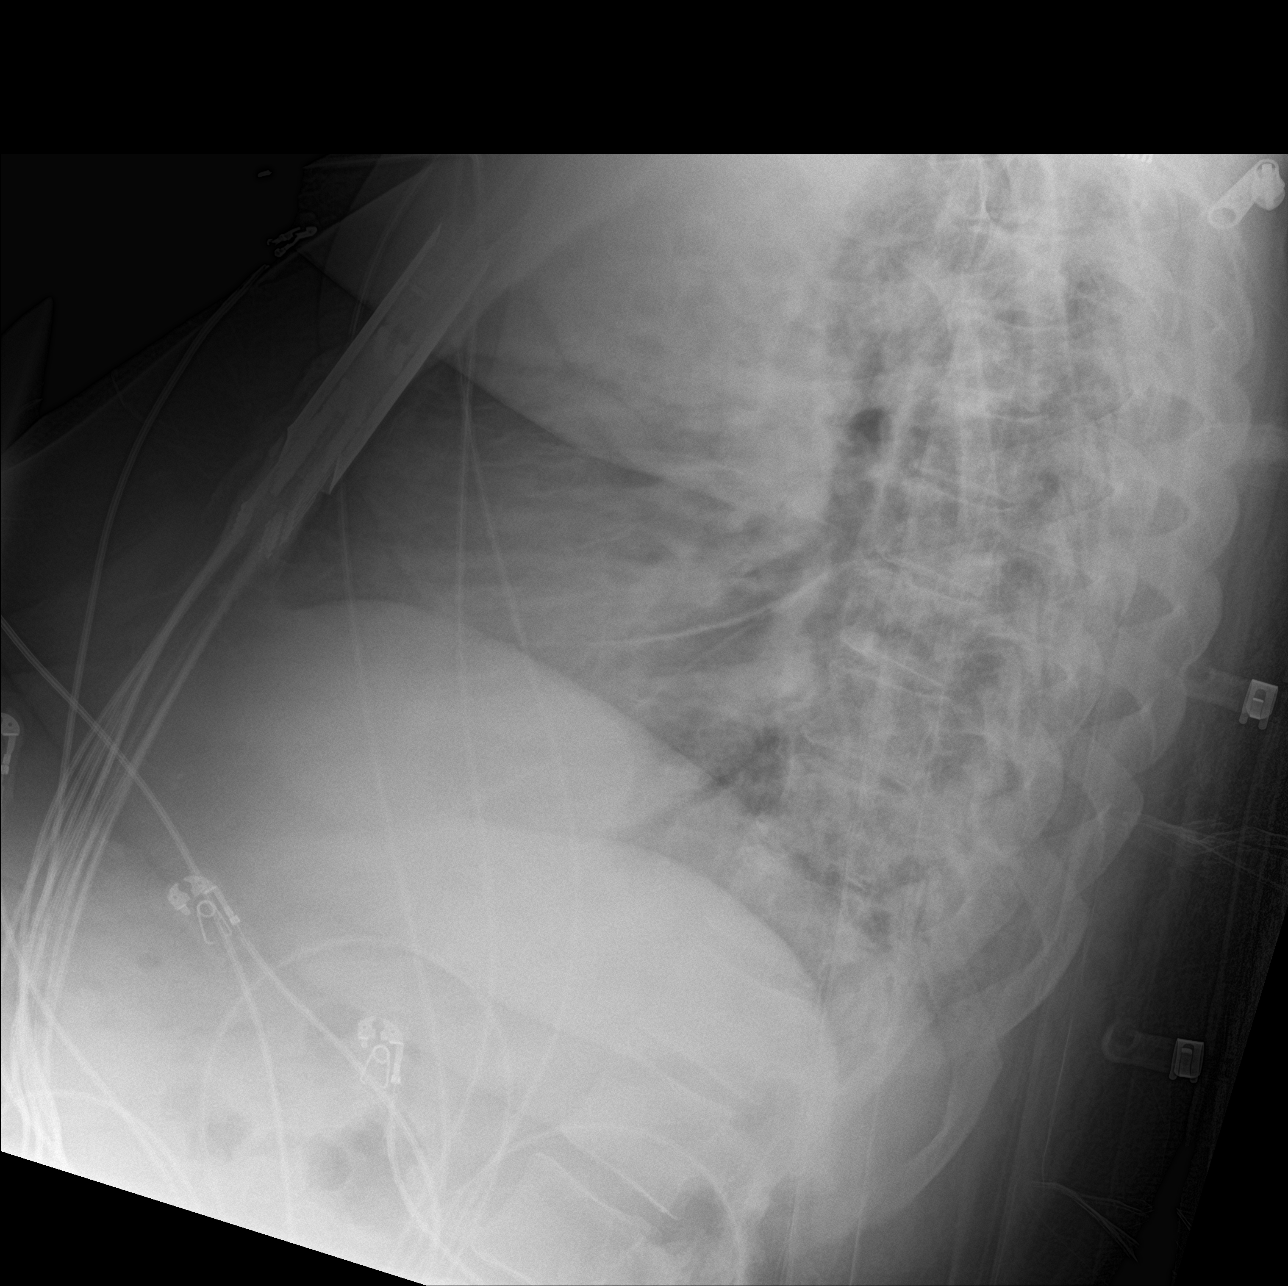

[3 of 3 positions shown; findings below may reference images not displayed]

FINDINGS: The lungs are reasonably well inflated. The interstitial markings
are increased. The pulmonary vascularity is engorged. The cardiac
silhouette is enlarged. The trachea is midline. The bony thorax
exhibits no acute abnormality.
IMPRESSION: CHF with pulmonary interstitial edema. No alveolar infiltrate nor
pleural effusion.

## 2018-05-16 ENCOUNTER — Encounter: Payer: Self-pay | Admitting: Internal Medicine

## 2018-05-18 IMAGING — DX DG CHEST 2V
2 series · 2 of 2 positions shown · non-contrast
Comparison: September 05, 2016

CLINICAL DATA: Hypertension and atrial fibrillation. History of
cardiomyopathy.

EXAM:
CHEST  2 VIEW

[chest lat]
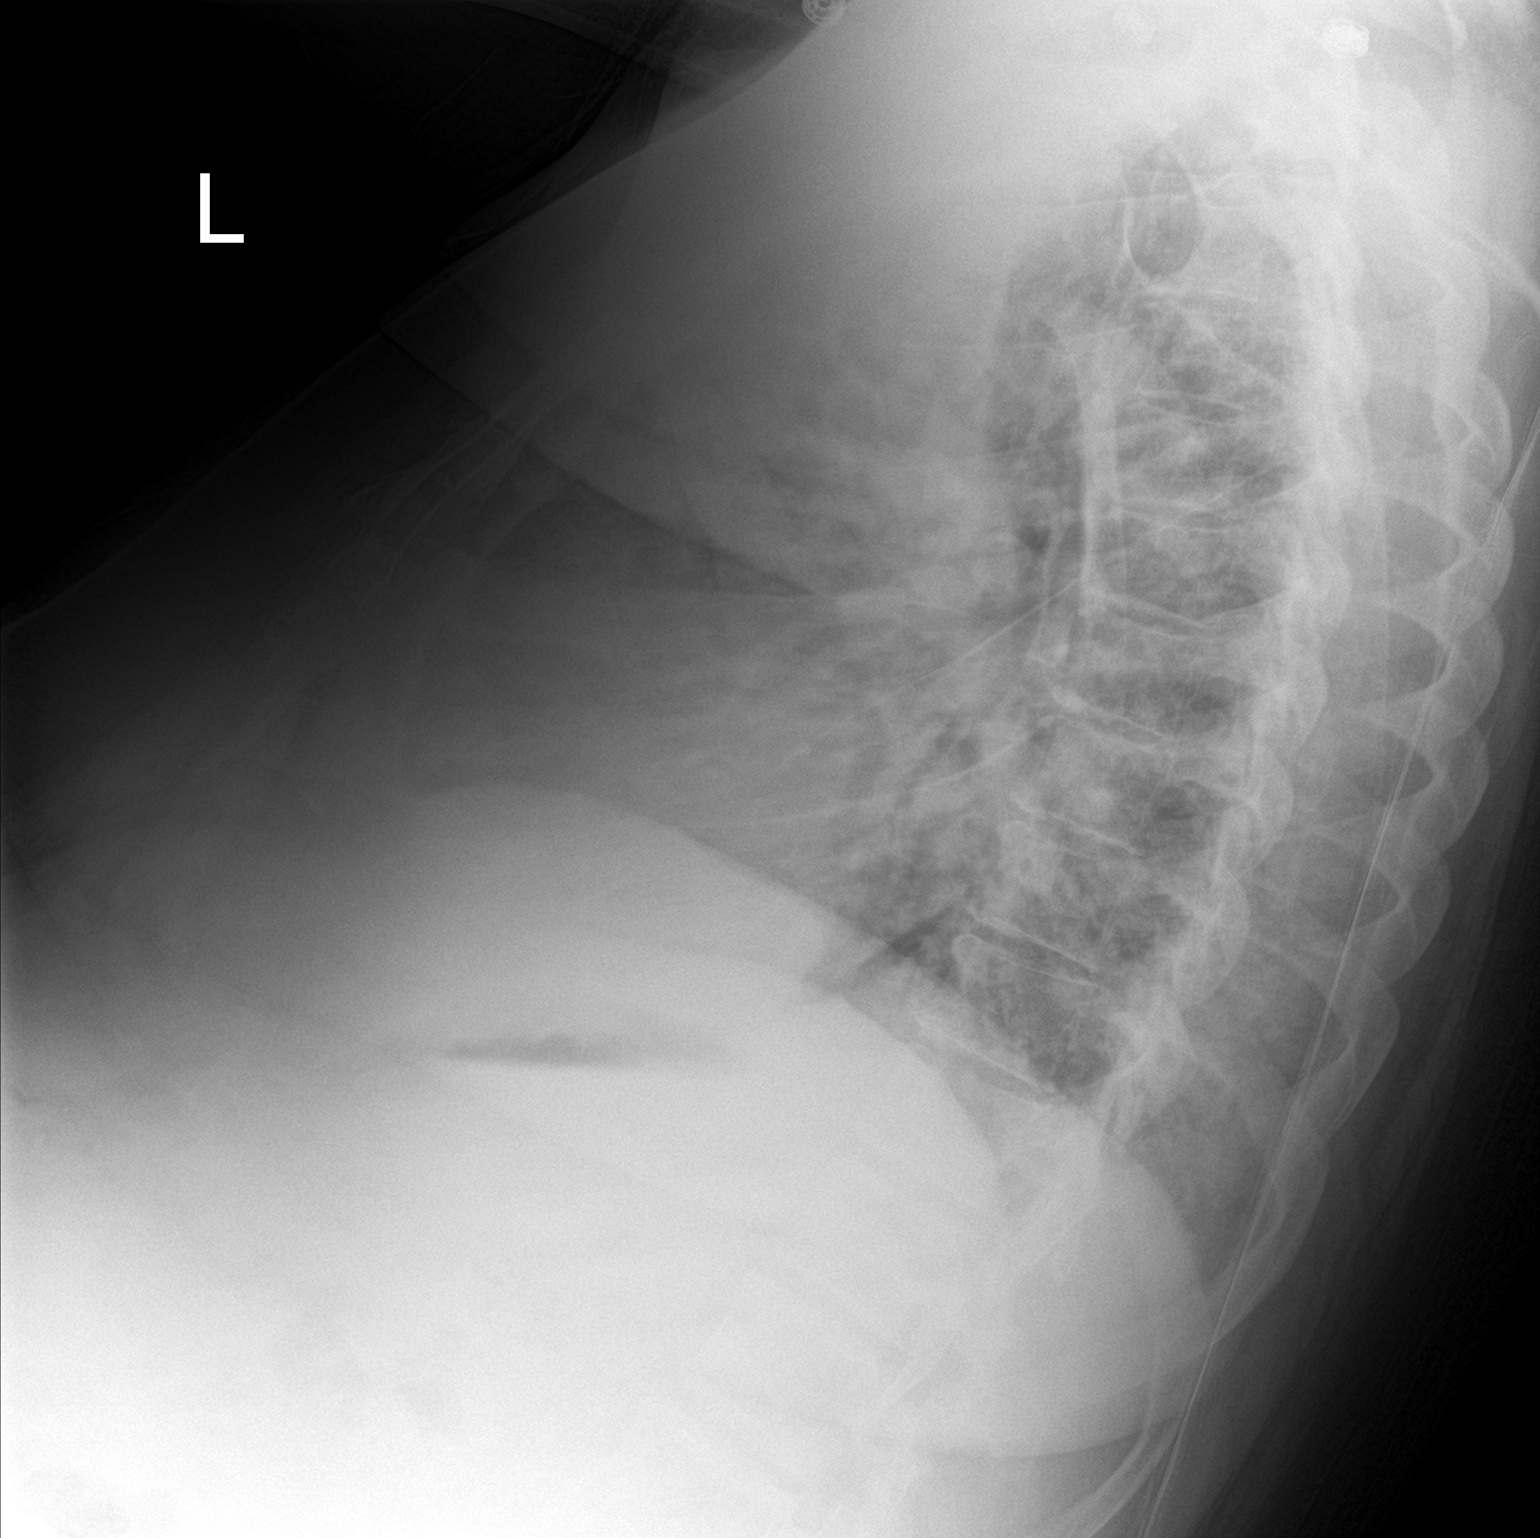

[chest ap]
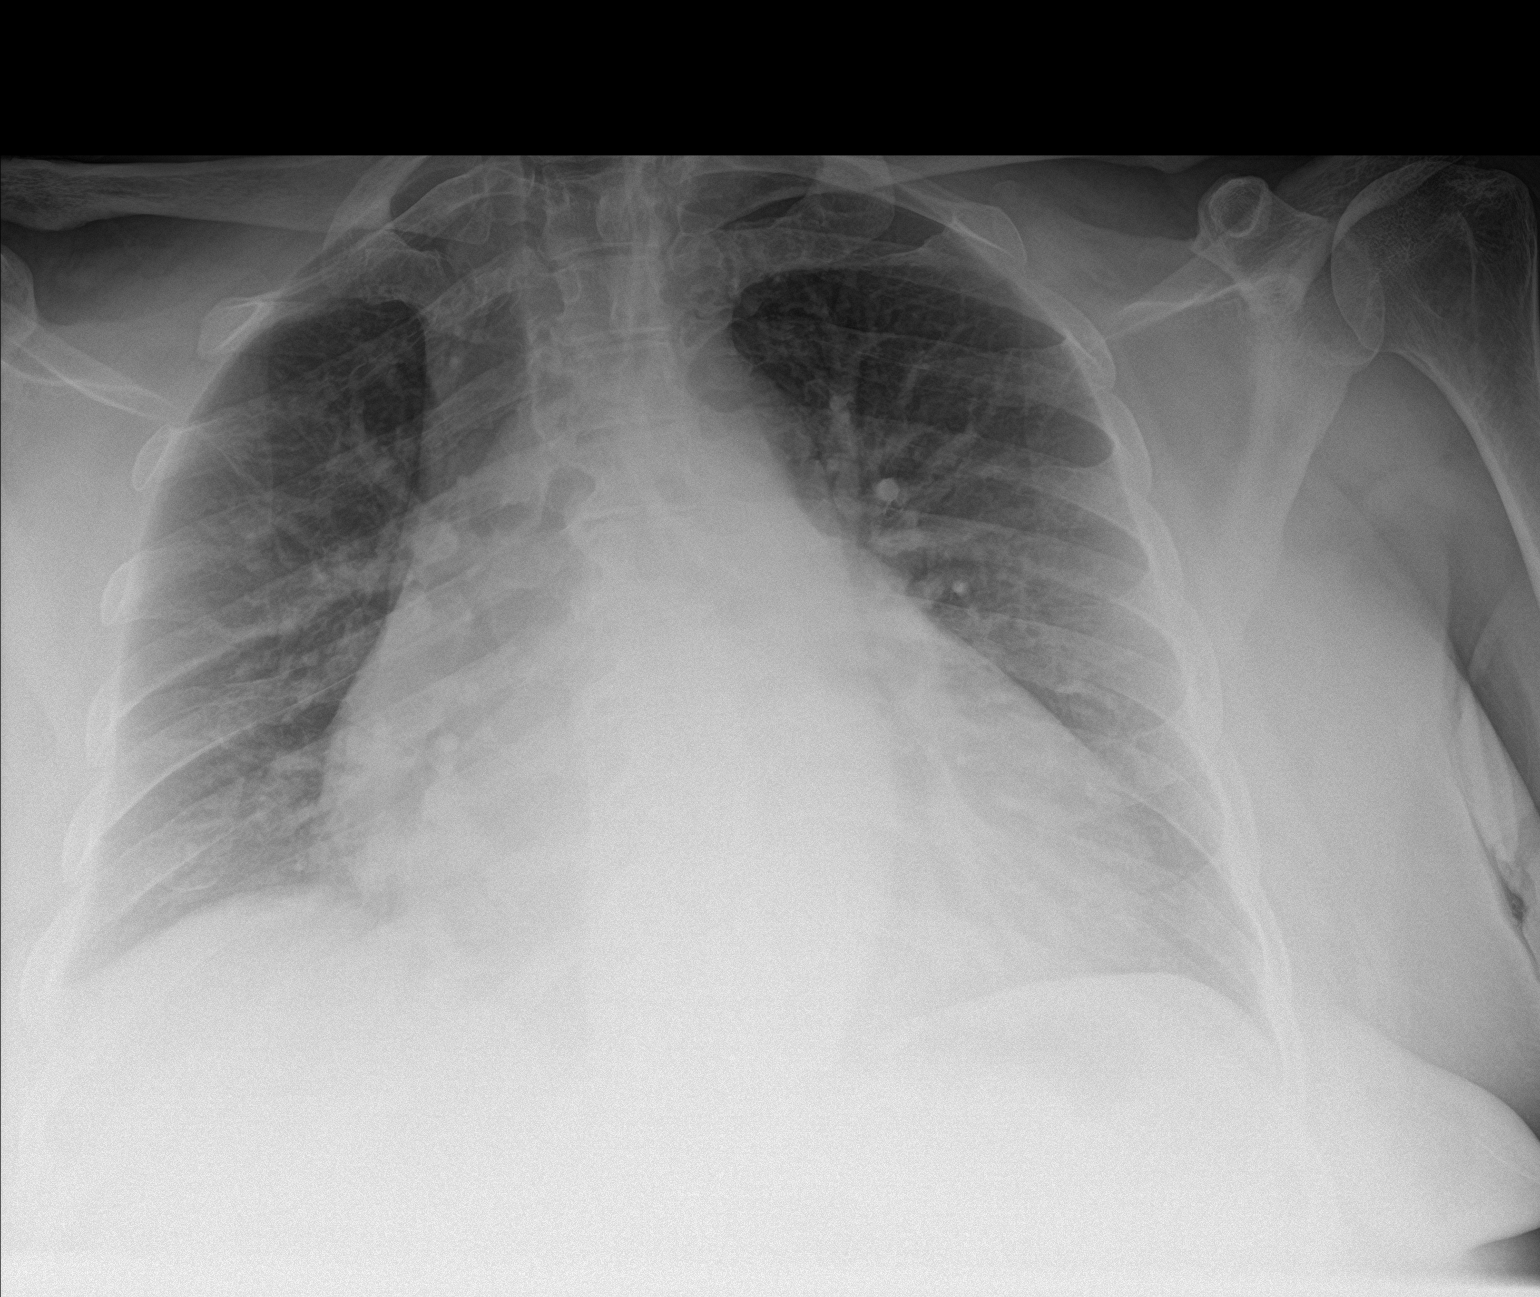

[2 of 2 positions shown; findings below may reference images not displayed]

FINDINGS: There is generalized cardiomegaly, stable. Pulmonary vascularity is
normal. No edema or consolidation. No adenopathy. No bone lesions.
IMPRESSION: Moderate generalized cardiomegaly, stable. No edema or
consolidation.

## 2018-05-18 IMAGING — CT CT ABD-PELV W/ CM
2 of 5 series · 16 of 46 positions shown, 18 images · IV contrast (Isovue)
Comparison: 09/01/2016, 01/05/2016

CLINICAL DATA: Left lower quadrant pain for 1 month

EXAM:
CT ABDOMEN AND PELVIS WITH CONTRAST
TECHNIQUE: Multidetector CT imaging of the abdomen and pelvis was performed
using the standard protocol following bolus administration of
intravenous contrast.
CONTRAST:  30mL O4YB1Q-399 IOPAMIDOL (O4YB1Q-399) INJECTION 61%,
100mL O4YB1Q-399 IOPAMIDOL (O4YB1Q-399) INJECTION 61%

[Series 2: axial st · axial · 0.93mm/px · z∈[-452,-37]mm · 13 of 95 slices shown, 15 images]
[im 6/95  soft-tissue]
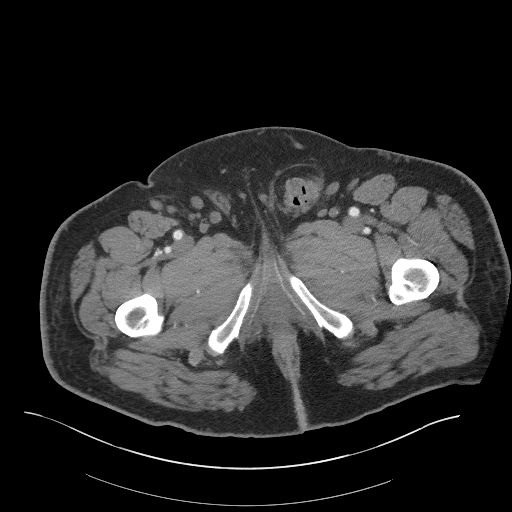
[im 6/95  bone]
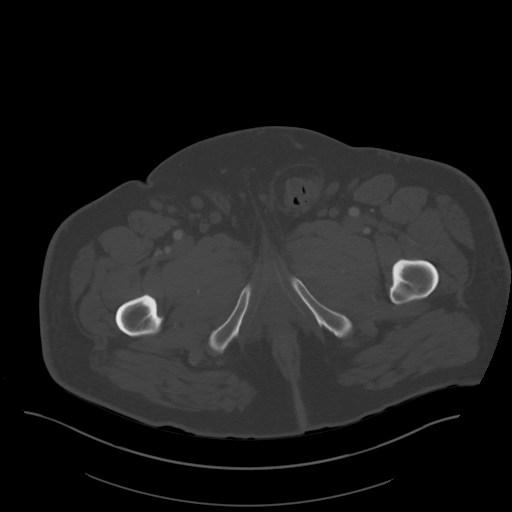
[im 11/95  soft-tissue]
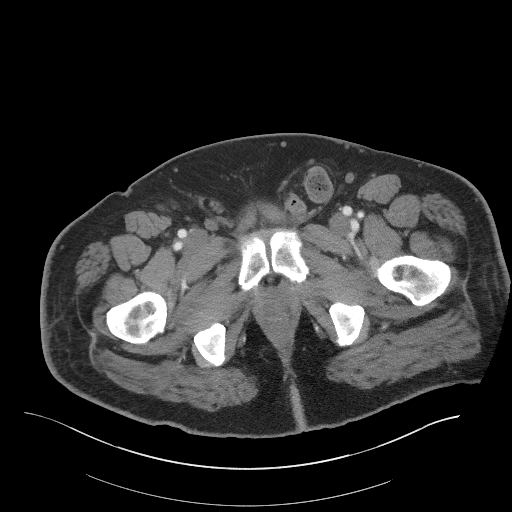
[im 21/95  soft-tissue]
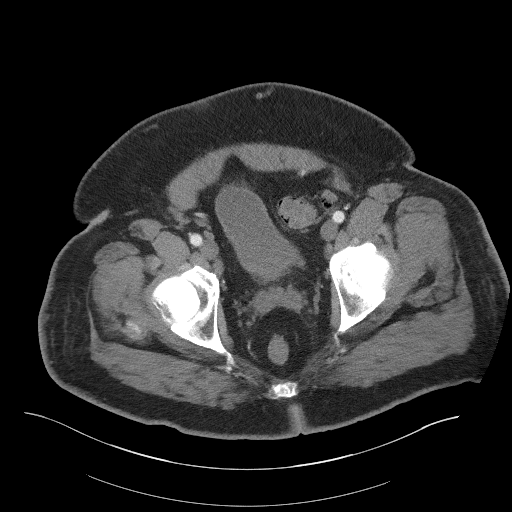
[im 27/95  soft-tissue]
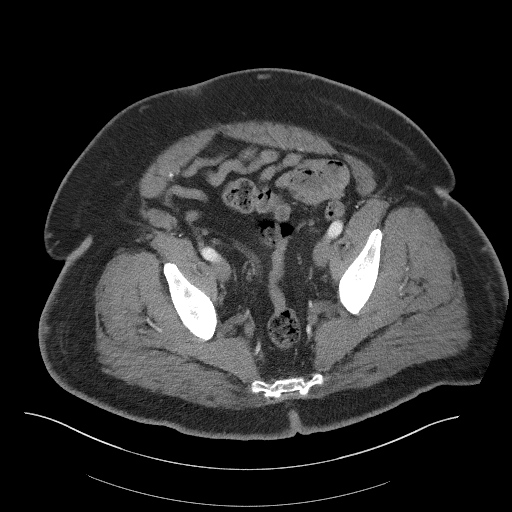
[im 32/95  soft-tissue]
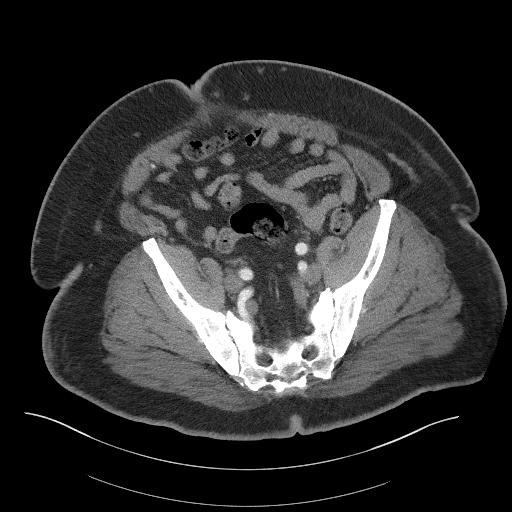
[im 42/95  soft-tissue]
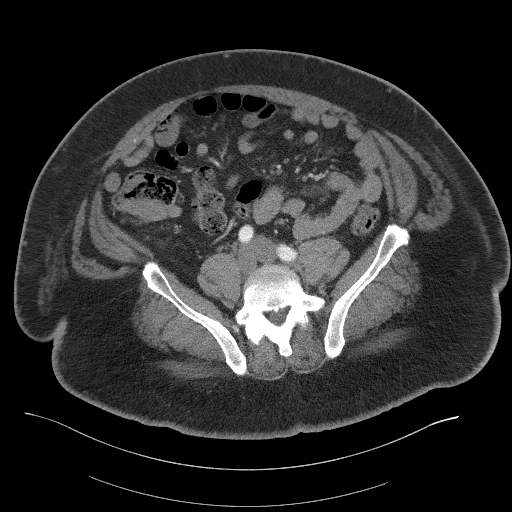
[im 48/95  soft-tissue]
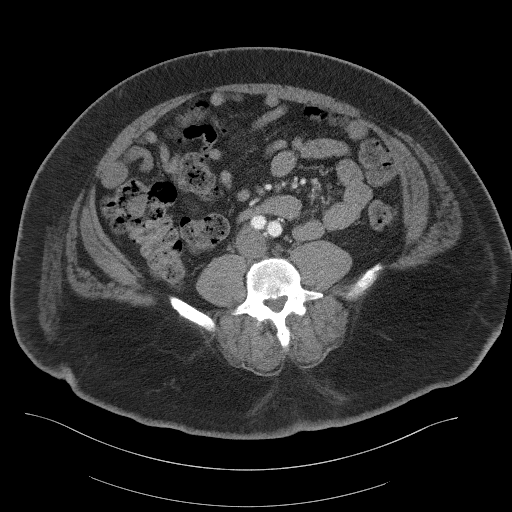
[im 53/95  soft-tissue]
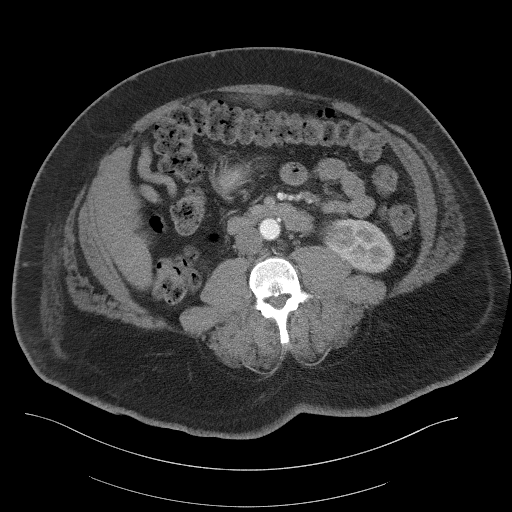
[im 63/95  soft-tissue]
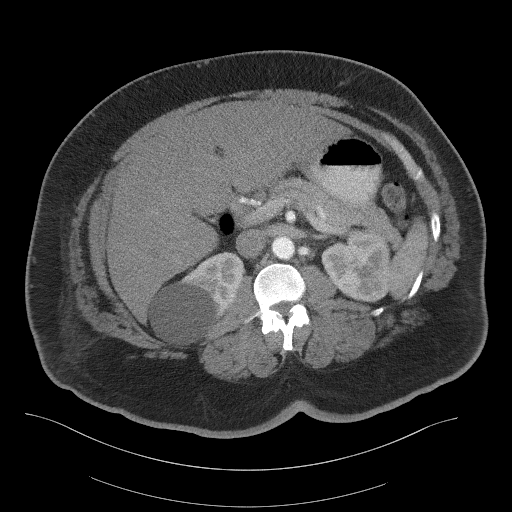
[im 63/95  bone]
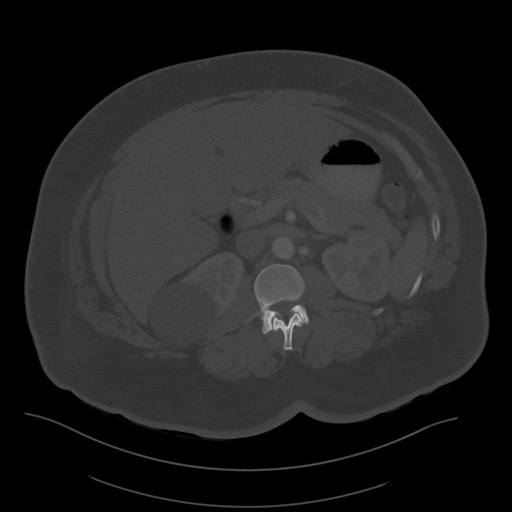
[im 68/95  soft-tissue]
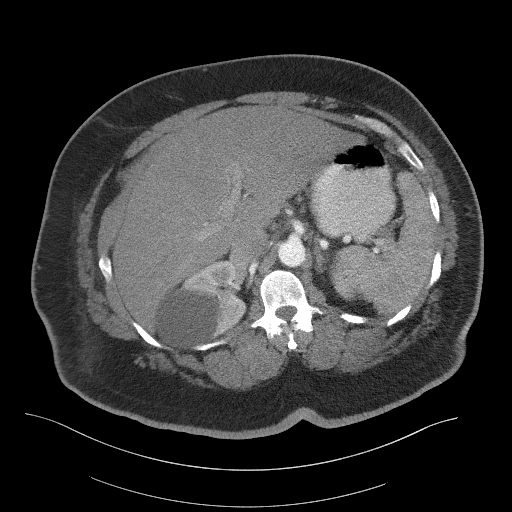
[im 74/95  soft-tissue]
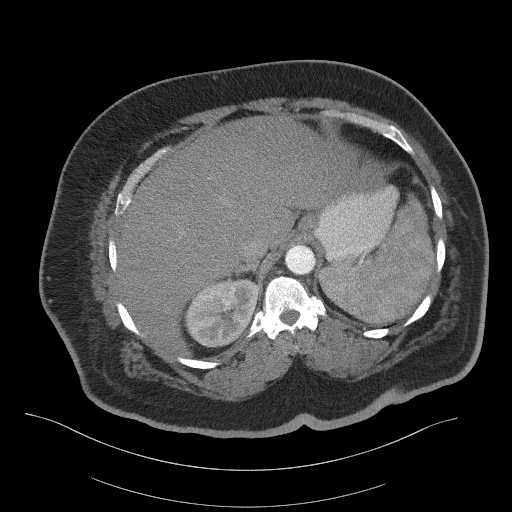
[im 84/95  soft-tissue]
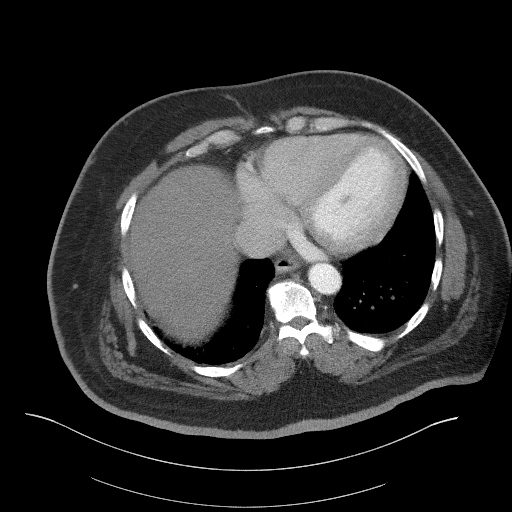
[im 89/95  soft-tissue]
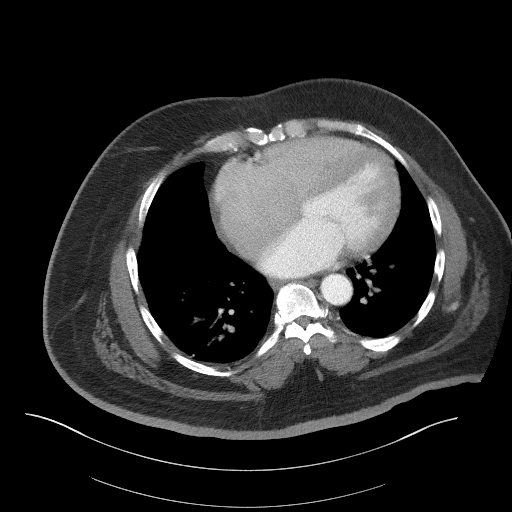

[Series 5: coronal st · coronal · 0.83mm/px · 3 of 118 slices shown]
[im 40/118  soft-tissue]
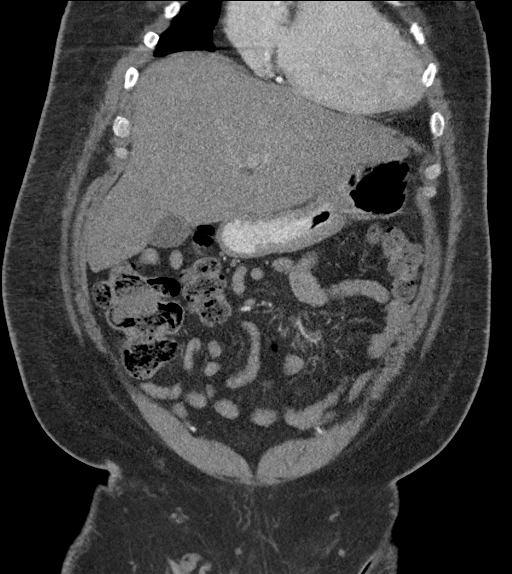
[im 53/118  soft-tissue]
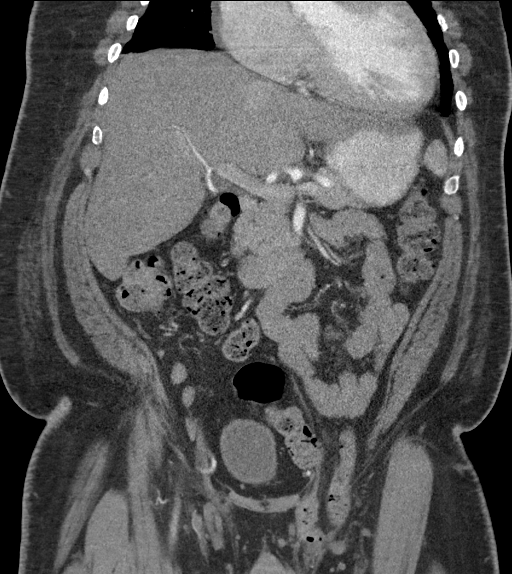
[im 66/118  soft-tissue]
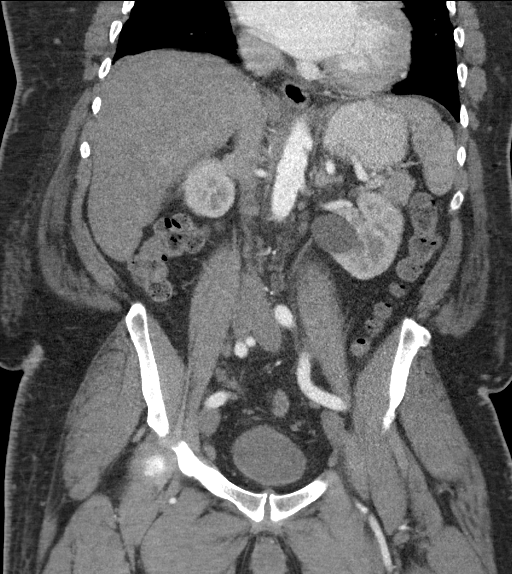

[16 of 46 positions shown; findings below may reference images not displayed]

FINDINGS: Lower chest: Scattered blebs at the lung base. No consolidation or
effusion. Cardiomegaly.

Hepatobiliary: Hepatic steatosis. Small calcified gallstones. No
biliary dilatation

Pancreas: Unremarkable. No pancreatic ductal dilatation or
surrounding inflammatory changes.

Spleen: Normal in size without focal abnormality.

Adrenals/Urinary Tract: Adrenal glands within normal limits. Stable
cysts in the kidneys. No hydronephrosis. Bladder normal.

Stomach/Bowel: The stomach is nonenlarged. No dilated small bowel.
No colon wall thickening. Normal appendix.

Vascular/Lymphatic: No aneurysm. No significantly enlarged lymph
nodes

Reproductive: Prostate is slightly enlarged and has mild mass effect
on posterior bladder.

Other: Again visualized is a left inguinal hernia containing sigmoid
colon. There is no evidence for obstruction. There is a trace amount
of fluid and soft tissue stranding along the lower aspect of the
herniated bowel loop.

Musculoskeletal: Stable Schmorl's node T11. No acute or suspicious
bone lesion.
IMPRESSION: 1. Left inguinal hernia containing sigmoid colon. There is no
evidence for a bowel obstruction. There is however minimal fluid and
edema within the left inguinal hernia sac slightly inferior to the
herniated bowel loop. The herniated bowel loop itself shows no wall
thickening.
2. Hepatic steatosis.  Small gallstones.

## 2018-05-26 IMAGING — DX DG CHEST 2V
2 series · 2 of 2 positions shown · non-contrast
Comparison: Chest radiograph performed 09/10/2016

CLINICAL DATA: Acute onset of generalized chest pain and shortness
of breath. Productive cough. Initial encounter.

EXAM:
CHEST  2 VIEW

[chest pa]
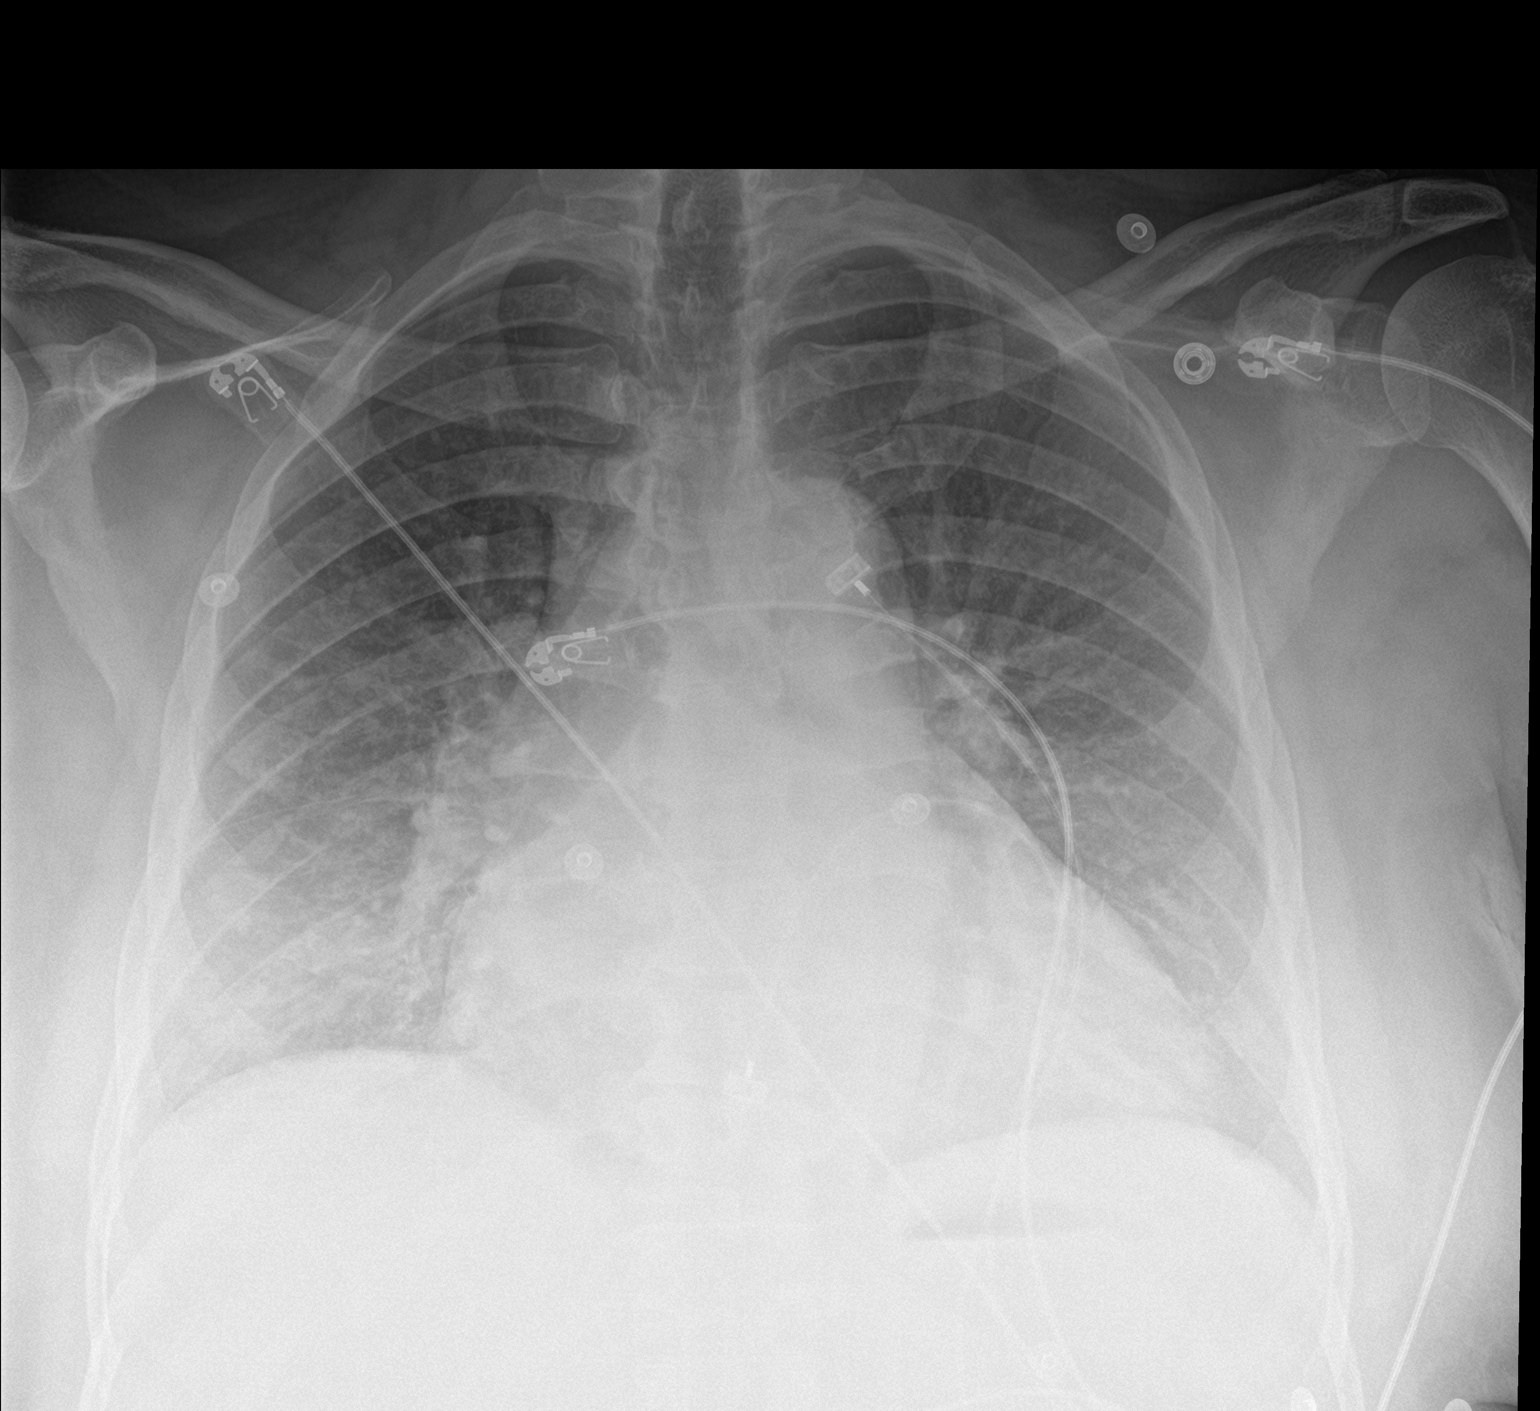

[chest lat]
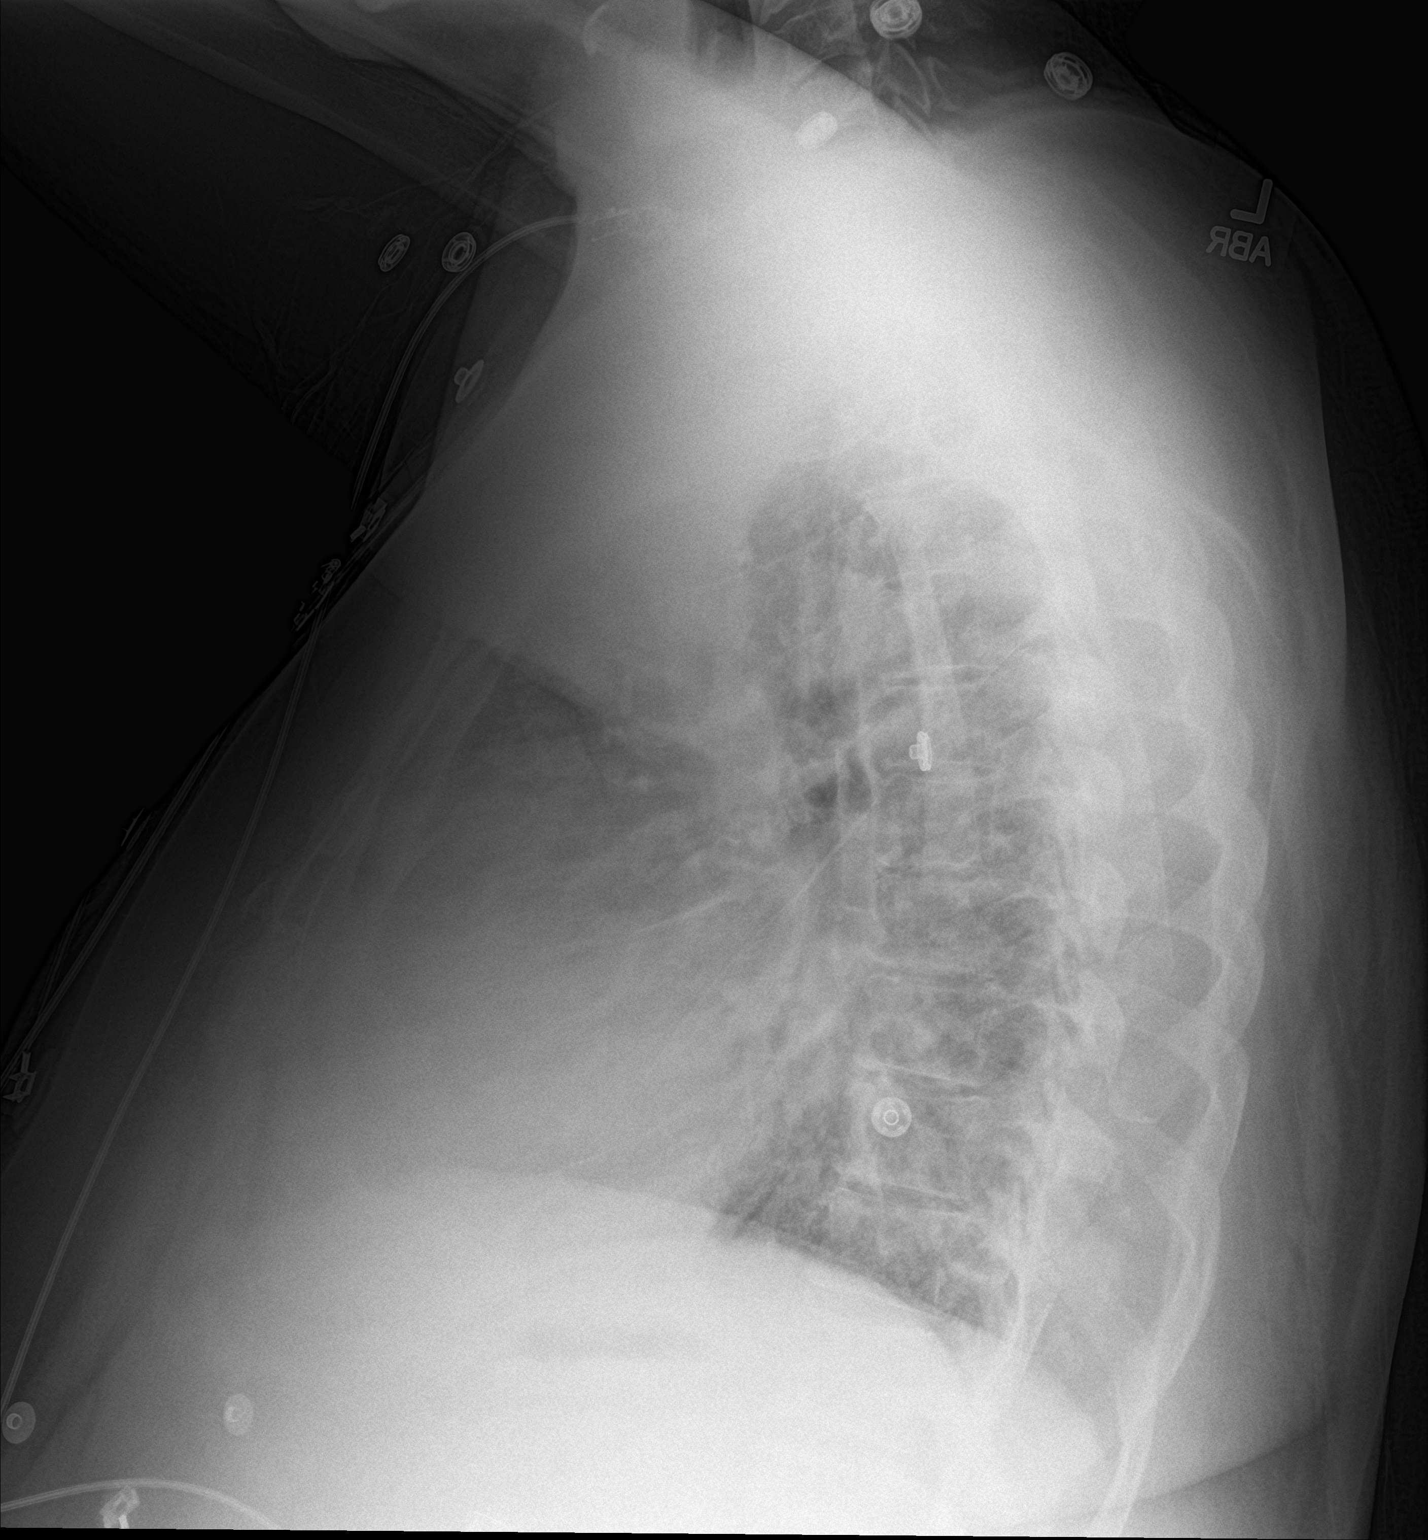

[2 of 2 positions shown; findings below may reference images not displayed]

FINDINGS: The lungs are well-aerated. Vascular congestion is noted. Increased
interstitial markings raise concern for pulmonary edema. There is no
evidence of pleural effusion or pneumothorax.

The heart is mildly enlarged. No acute osseous abnormalities are
seen.
IMPRESSION: Vascular congestion and mild cardiomegaly. Increased interstitial
markings raise concern for pulmonary edema.

## 2018-06-08 IMAGING — CR DG CHEST 1V PORT
1 series · 1 of 1 positions shown · non-contrast
Comparison: 09/19/2016

CLINICAL DATA: Dyspnea, history of asthma

EXAM:
PORTABLE CHEST 1 VIEW

[ap portable]
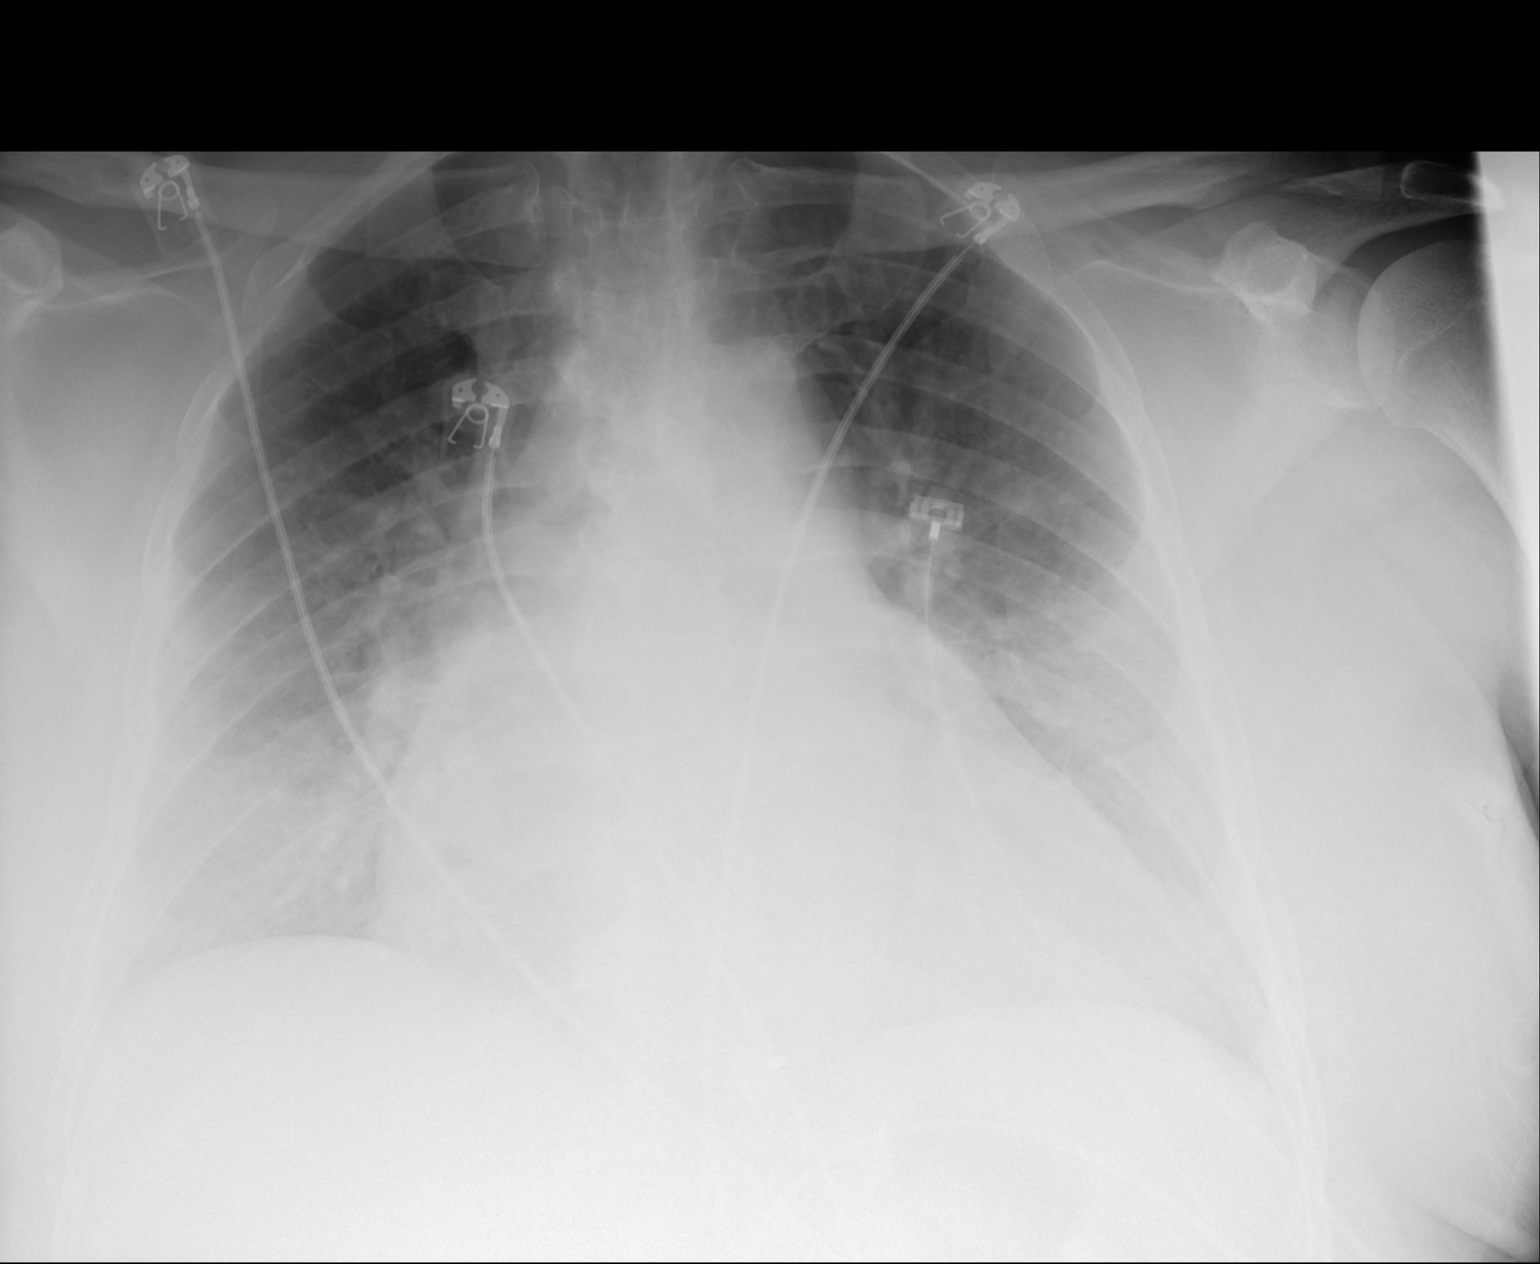

[1 of 1 positions shown; findings below may reference images not displayed]

FINDINGS: Stable cardiomegaly. No aortic aneurysm. Diffuse airspace opacities
consistent with pulmonary edema. No significant effusion. No
pneumothorax identified. No acute nor suspicious osseous
abnormality.
IMPRESSION: Stable cardiomegaly with CHF. Findings are similar to that of prior
exam.

## 2018-06-10 DIAGNOSIS — I5043 Acute on chronic combined systolic (congestive) and diastolic (congestive) heart failure: Secondary | ICD-10-CM | POA: Diagnosis not present

## 2018-06-11 ENCOUNTER — Encounter (HOSPITAL_COMMUNITY): Payer: Self-pay

## 2018-06-11 ENCOUNTER — Other Ambulatory Visit: Payer: Self-pay

## 2018-06-11 ENCOUNTER — Emergency Department (HOSPITAL_COMMUNITY): Payer: Medicare Other

## 2018-06-11 ENCOUNTER — Inpatient Hospital Stay (HOSPITAL_COMMUNITY)
Admission: EM | Admit: 2018-06-11 | Discharge: 2018-06-13 | DRG: 291 | Disposition: A | Discharge status: Home / Self Care | Payer: Medicare Other | Attending: Internal Medicine | Admitting: Internal Medicine

## 2018-06-11 DIAGNOSIS — I130 Hypertensive heart and chronic kidney disease with heart failure and stage 1 through stage 4 chronic kidney disease, or unspecified chronic kidney disease: Principal | ICD-10-CM | POA: Diagnosis present

## 2018-06-11 DIAGNOSIS — T502X5A Adverse effect of carbonic-anhydrase inhibitors, benzothiadiazides and other diuretics, initial encounter: Secondary | ICD-10-CM | POA: Diagnosis not present

## 2018-06-11 DIAGNOSIS — Z8711 Personal history of peptic ulcer disease: Secondary | ICD-10-CM

## 2018-06-11 DIAGNOSIS — N189 Chronic kidney disease, unspecified: Secondary | ICD-10-CM | POA: Diagnosis not present

## 2018-06-11 DIAGNOSIS — I10 Essential (primary) hypertension: Secondary | ICD-10-CM | POA: Diagnosis not present

## 2018-06-11 DIAGNOSIS — I5043 Acute on chronic combined systolic (congestive) and diastolic (congestive) heart failure: Secondary | ICD-10-CM | POA: Diagnosis not present

## 2018-06-11 DIAGNOSIS — I509 Heart failure, unspecified: Secondary | ICD-10-CM | POA: Diagnosis not present

## 2018-06-11 DIAGNOSIS — Z91018 Allergy to other foods: Secondary | ICD-10-CM | POA: Diagnosis not present

## 2018-06-11 DIAGNOSIS — Z9981 Dependence on supplemental oxygen: Secondary | ICD-10-CM

## 2018-06-11 DIAGNOSIS — Z8249 Family history of ischemic heart disease and other diseases of the circulatory system: Secondary | ICD-10-CM | POA: Diagnosis not present

## 2018-06-11 DIAGNOSIS — I428 Other cardiomyopathies: Secondary | ICD-10-CM | POA: Diagnosis present

## 2018-06-11 DIAGNOSIS — Z9119 Patient's noncompliance with other medical treatment and regimen: Secondary | ICD-10-CM

## 2018-06-11 DIAGNOSIS — I252 Old myocardial infarction: Secondary | ICD-10-CM

## 2018-06-11 DIAGNOSIS — Z6841 Body Mass Index (BMI) 40.0 and over, adult: Secondary | ICD-10-CM

## 2018-06-11 DIAGNOSIS — I110 Hypertensive heart disease with heart failure: Secondary | ICD-10-CM | POA: Diagnosis not present

## 2018-06-11 DIAGNOSIS — R0602 Shortness of breath: Secondary | ICD-10-CM | POA: Diagnosis not present

## 2018-06-11 DIAGNOSIS — J45909 Unspecified asthma, uncomplicated: Secondary | ICD-10-CM | POA: Diagnosis present

## 2018-06-11 DIAGNOSIS — I5033 Acute on chronic diastolic (congestive) heart failure: Secondary | ICD-10-CM

## 2018-06-11 DIAGNOSIS — Z823 Family history of stroke: Secondary | ICD-10-CM | POA: Diagnosis not present

## 2018-06-11 DIAGNOSIS — Z79899 Other long term (current) drug therapy: Secondary | ICD-10-CM | POA: Diagnosis not present

## 2018-06-11 DIAGNOSIS — N400 Enlarged prostate without lower urinary tract symptoms: Secondary | ICD-10-CM | POA: Diagnosis present

## 2018-06-11 DIAGNOSIS — E785 Hyperlipidemia, unspecified: Secondary | ICD-10-CM | POA: Diagnosis not present

## 2018-06-11 DIAGNOSIS — Z7901 Long term (current) use of anticoagulants: Secondary | ICD-10-CM | POA: Diagnosis not present

## 2018-06-11 DIAGNOSIS — R609 Edema, unspecified: Secondary | ICD-10-CM | POA: Diagnosis not present

## 2018-06-11 DIAGNOSIS — I482 Chronic atrial fibrillation, unspecified: Secondary | ICD-10-CM | POA: Diagnosis present

## 2018-06-11 DIAGNOSIS — E876 Hypokalemia: Secondary | ICD-10-CM | POA: Diagnosis present

## 2018-06-11 DIAGNOSIS — G4733 Obstructive sleep apnea (adult) (pediatric): Secondary | ICD-10-CM | POA: Diagnosis not present

## 2018-06-11 DIAGNOSIS — K219 Gastro-esophageal reflux disease without esophagitis: Secondary | ICD-10-CM | POA: Diagnosis not present

## 2018-06-11 DIAGNOSIS — I4891 Unspecified atrial fibrillation: Secondary | ICD-10-CM | POA: Diagnosis not present

## 2018-06-11 LAB — BASIC METABOLIC PANEL
Anion gap: 8 (ref 5–15)
BUN: 12 mg/dL (ref 6–20)
CALCIUM: 8.5 mg/dL — AB (ref 8.9–10.3)
CO2: 29 mmol/L (ref 22–32)
Chloride: 104 mmol/L (ref 98–111)
Creatinine, Ser: 1.23 mg/dL (ref 0.61–1.24)
GFR calc Af Amer: >60 mL/min (ref >60)
GFR calc non Af Amer: >60 mL/min (ref >60)
Glucose, Bld: 101 mg/dL — ABNORMAL HIGH (ref 70–99)
Potassium: 2.8 mmol/L — ABNORMAL LOW (ref 3.5–5.1)
Sodium: 141 mmol/L (ref 135–145)

## 2018-06-11 LAB — CBC WITH DIFFERENTIAL/PLATELET
Abs Immature Granulocytes: 0.01 10*3/uL (ref 0.00–0.07)
BASOS ABS: 0 10*3/uL (ref 0.0–0.1)
BASOS PCT: 0 %
EOS ABS: 0.1 10*3/uL (ref 0.0–0.5)
EOS PCT: 2 %
HEMATOCRIT: 41.1 % (ref 39.0–52.0)
Hemoglobin: 13 g/dL (ref 13.0–17.0)
IMMATURE GRANULOCYTES: 0 %
LYMPHS ABS: 1.1 10*3/uL (ref 0.7–4.0)
Lymphocytes Relative: 23 %
MCH: 28.8 pg (ref 26.0–34.0)
MCHC: 31.6 g/dL (ref 30.0–36.0)
MCV: 90.9 fL (ref 80.0–100.0)
Monocytes Absolute: 0.5 10*3/uL (ref 0.1–1.0)
Monocytes Relative: 11 %
NEUTROS PCT: 64 %
Neutro Abs: 2.9 10*3/uL (ref 1.7–7.7)
PLATELETS: 192 10*3/uL (ref 150–400)
RBC: 4.52 MIL/uL (ref 4.22–5.81)
RDW: 13.6 % (ref 11.5–15.5)
WBC: 4.6 10*3/uL (ref 4.0–10.5)
nRBC: 0 % (ref 0.0–0.2)

## 2018-06-11 LAB — I-STAT TROPONIN, ED: TROPONIN I, POC: 0.01 ng/mL (ref 0.00–0.08)

## 2018-06-11 LAB — BRAIN NATRIURETIC PEPTIDE: B NATRIURETIC PEPTIDE 5: 188 pg/mL — AB (ref 0.0–100.0)

## 2018-06-11 LAB — MAGNESIUM: Magnesium: 1.9 mg/dL (ref 1.7–2.4)

## 2018-06-11 MED ORDER — POTASSIUM CHLORIDE CRYS ER 20 MEQ PO TBCR
40.0000 meq | EXTENDED_RELEASE_TABLET | Freq: Once | ORAL | Status: AC
  Administered 2018-06-11: 40 meq via ORAL
  Filled 2018-06-11: qty 2

## 2018-06-11 MED ORDER — IPRATROPIUM-ALBUTEROL 0.5-2.5 (3) MG/3ML IN SOLN
3.0000 mL | Freq: Four times a day (QID) | RESPIRATORY_TRACT | Status: DC | PRN
  Administered 2018-06-11: 3 mL via RESPIRATORY_TRACT
  Filled 2018-06-11: qty 3

## 2018-06-11 MED ORDER — ATORVASTATIN CALCIUM 40 MG PO TABS
40.0000 mg | ORAL_TABLET | Freq: Every day | ORAL | Status: DC
  Administered 2018-06-11 – 2018-06-12 (×2): 40 mg via ORAL
  Filled 2018-06-11 (×2): qty 1

## 2018-06-11 MED ORDER — CARVEDILOL 12.5 MG PO TABS
37.5000 mg | ORAL_TABLET | Freq: Two times a day (BID) | ORAL | Status: DC
  Administered 2018-06-11 – 2018-06-13 (×4): 37.5 mg via ORAL
  Filled 2018-06-11 (×4): qty 3

## 2018-06-11 MED ORDER — RIVAROXABAN 20 MG PO TABS
20.0000 mg | ORAL_TABLET | Freq: Every day | ORAL | Status: DC
  Administered 2018-06-11 – 2018-06-13 (×3): 20 mg via ORAL
  Filled 2018-06-11 (×4): qty 1

## 2018-06-11 MED ORDER — SPIRONOLACTONE 100 MG PO TABS
100.0000 mg | ORAL_TABLET | Freq: Every day | ORAL | Status: DC
  Administered 2018-06-11 – 2018-06-13 (×3): 100 mg via ORAL
  Filled 2018-06-11 (×5): qty 1

## 2018-06-11 MED ORDER — PANTOPRAZOLE SODIUM 40 MG PO TBEC
40.0000 mg | DELAYED_RELEASE_TABLET | Freq: Every day | ORAL | Status: DC
  Administered 2018-06-11 – 2018-06-13 (×3): 40 mg via ORAL
  Filled 2018-06-11 (×3): qty 1

## 2018-06-11 MED ORDER — SODIUM CHLORIDE 0.9 % IV SOLN: 250.0000 mL | INTRAVENOUS | Status: DC | PRN

## 2018-06-11 MED ORDER — BECLOMETHASONE DIPROPIONATE 80 MCG/ACT IN AERS: 2.0000 | INHALATION_SPRAY | Freq: Two times a day (BID) | RESPIRATORY_TRACT | Status: DC

## 2018-06-11 MED ORDER — FUROSEMIDE 10 MG/ML IJ SOLN
80.0000 mg | Freq: Three times a day (TID) | INTRAMUSCULAR | Status: DC
  Administered 2018-06-11 – 2018-06-13 (×6): 80 mg via INTRAVENOUS
  Filled 2018-06-11 (×6): qty 8

## 2018-06-11 MED ORDER — FUROSEMIDE 10 MG/ML IJ SOLN
80.0000 mg | Freq: Once | INTRAMUSCULAR | Status: AC
  Administered 2018-06-11: 80 mg via INTRAVENOUS
  Filled 2018-06-11: qty 8

## 2018-06-11 MED ORDER — DOCUSATE SODIUM 100 MG PO CAPS
100.0000 mg | ORAL_CAPSULE | Freq: Two times a day (BID) | ORAL | Status: DC
  Administered 2018-06-11 – 2018-06-13 (×4): 100 mg via ORAL
  Filled 2018-06-11 (×5): qty 1

## 2018-06-11 MED ORDER — ALBUTEROL SULFATE (2.5 MG/3ML) 0.083% IN NEBU: 2.5000 mg | INHALATION_SOLUTION | Freq: Four times a day (QID) | RESPIRATORY_TRACT | Status: DC | PRN

## 2018-06-11 MED ORDER — BUDESONIDE 0.5 MG/2ML IN SUSP
0.5000 mg | Freq: Two times a day (BID) | RESPIRATORY_TRACT | Status: DC
  Administered 2018-06-11 – 2018-06-13 (×4): 0.5 mg via RESPIRATORY_TRACT
  Filled 2018-06-11 (×9): qty 2

## 2018-06-11 MED ORDER — ASPIRIN 81 MG PO CHEW
324.0000 mg | CHEWABLE_TABLET | Freq: Once | ORAL | Status: AC
  Administered 2018-06-11: 324 mg via ORAL
  Filled 2018-06-11: qty 4

## 2018-06-11 MED ORDER — SODIUM CHLORIDE 0.9% FLUSH
3.0000 mL | INTRAVENOUS | Status: DC | PRN
  Administered 2018-06-13: 3 mL via INTRAVENOUS
  Filled 2018-06-11: qty 3

## 2018-06-11 MED ORDER — POTASSIUM CHLORIDE 10 MEQ/100ML IV SOLN
10.0000 meq | INTRAVENOUS | Status: AC
  Administered 2018-06-11 (×3): 10 meq via INTRAVENOUS
  Filled 2018-06-11 (×3): qty 100

## 2018-06-11 MED ORDER — SODIUM CHLORIDE 0.9% FLUSH
3.0000 mL | Freq: Two times a day (BID) | INTRAVENOUS | Status: DC
  Administered 2018-06-11 – 2018-06-13 (×4): 3 mL via INTRAVENOUS

## 2018-06-11 MED ORDER — ONDANSETRON HCL 4 MG PO TABS: 4.0000 mg | ORAL_TABLET | Freq: Four times a day (QID) | ORAL | Status: DC | PRN

## 2018-06-11 MED ORDER — ALUM & MAG HYDROXIDE-SIMETH 200-200-20 MG/5ML PO SUSP
30.0000 mL | Freq: Once | ORAL | Status: AC
  Administered 2018-06-11: 30 mL via ORAL
  Filled 2018-06-11: qty 30

## 2018-06-11 MED ORDER — HYDRALAZINE HCL 20 MG/ML IJ SOLN: 10.0000 mg | INTRAMUSCULAR | Status: DC | PRN

## 2018-06-11 MED ORDER — ONDANSETRON HCL 4 MG/2ML IJ SOLN: 4.0000 mg | Freq: Four times a day (QID) | INTRAMUSCULAR | Status: DC | PRN

## 2018-06-11 MED ORDER — ASPIRIN 81 MG PO CHEW
81.0000 mg | CHEWABLE_TABLET | Freq: Every day | ORAL | Status: DC
  Administered 2018-06-12 – 2018-06-13 (×2): 81 mg via ORAL
  Filled 2018-06-11 (×2): qty 1

## 2018-06-11 MED ORDER — TAMSULOSIN HCL 0.4 MG PO CAPS
0.4000 mg | ORAL_CAPSULE | Freq: Every day | ORAL | Status: DC
  Administered 2018-06-11 – 2018-06-13 (×3): 0.4 mg via ORAL
  Filled 2018-06-11 (×3): qty 1

## 2018-06-11 MED ORDER — ACETAMINOPHEN 325 MG PO TABS
650.0000 mg | ORAL_TABLET | Freq: Four times a day (QID) | ORAL | Status: DC | PRN
  Administered 2018-06-12: 650 mg via ORAL
  Filled 2018-06-11: qty 2

## 2018-06-11 MED ORDER — ACETAMINOPHEN 650 MG RE SUPP: 650.0000 mg | Freq: Four times a day (QID) | RECTAL | Status: DC | PRN

## 2018-06-11 MED ORDER — FERROUS GLUCONATE 324 (38 FE) MG PO TABS
324.0000 mg | ORAL_TABLET | Freq: Two times a day (BID) | ORAL | Status: DC
  Administered 2018-06-11 – 2018-06-13 (×4): 324 mg via ORAL
  Filled 2018-06-11 (×8): qty 1

## 2018-06-11 NOTE — ED Notes (Signed)
Pt given a HH diet at this time.

## 2018-06-11 NOTE — ED Notes (Signed)
Pt given Heart Healthy breakfast meal tray.

## 2018-06-11 NOTE — ED Triage Notes (Signed)
Pt in by [facility] for chest pain.  Pt took 2 full strength aspirin at home and by the time ems arrived, the pain had resolved.  Pt also c/o fluid retention

## 2018-06-11 NOTE — ED Notes (Signed)
ED Provider at bedside. 

## 2018-06-11 NOTE — H&P (Signed)
History and Physical    [name] XVQ:[id number] DOB: 1966/04/30 DOA: 06/11/2018  PCP: Pearson Grippe, MD   Patient coming from: Home  Chief Complaint: Weight gain and swelling with dyspnea  HPI: [name] is a 52 y.o. male with medical history significant for chronic combined systolic and diastolic heart failure, nonischemic cardiomyopathy, obstructive sleep apnea on CPAP with chronic home oxygen, hypertension, dyslipidemia, and obesity who states that he has been on an inadequate amount of his home Bumex approximately 1 mg twice a day as opposed to 2 mg 3 times daily for the last couple weeks.  He states that this is because his pharmacy was unable to provide him with an adequate dosage and he has noted a slow, insidious weight gain of over 10 pounds.  He states that he has had worsening lower extremity edema as well and now dyspnea that is worse with exertion and laying flat that brought him to the ED.  He actually denies any chest pain, nor does he have any nausea or vomiting or diarrhea.  He denies any cough, fevers, or chills.   ED Course: Vital signs are stable with elevated blood pressure readings of 170 systolic and 100 diastolic.  He has been given 80 mg IV Lasix with minimal urine output noted as of yet and is noted to have potassium of 2.8 which is being supplemented IV and p.o.  His 1 view chest x-ray demonstrates some interstitial edema with cardiomegaly and BNP is 188.  He has had a recent echo performed on 03/2018 with EF 55% and mild LVH.  He states that he has been compliant with other medications.  Review of Systems: All others reviewed and otherwise negative.  Past Medical History:  Diagnosis Date  . Allergic rhinitis   . Asthma   . Atrial fibrillation (HCC)   . CHF (congestive heart failure) (HCC)   . Essential hypertension   . Gastroesophageal reflux disease   . Heart attack (HCC)   . History of cardiac catheterization    Normal coronaries December 2016  .  Noncompliance    Major problem leading to declining health and recurrent hospitalization  . Nonischemic cardiomyopathy (HCC)    LVEF 20-25%  . On home O2    3L N/C  . OSA (obstructive sleep apnea)   . Osteoarthritis   . Peptic ulcer disease     Past Surgical History:  Procedure Laterality Date  . CARDIAC CATHETERIZATION N/A 06/14/2015   Procedure: Right/Left Heart Cath and Coronary Angiography;  Surgeon: Runell Gess, MD;  Location: [facility] INVASIVE CV LAB;  Service: Cardiovascular;  Laterality: N/A;     reports that he quit smoking about 10 years ago. His smoking use included cigarettes. He started smoking about 30 years ago. He has a 10.00 pack-year smoking history. He has never used smokeless tobacco. He reports that he does not drink alcohol or use drugs.  Allergies  Allergen Reactions  . Banana Shortness Of Breath  . Bee Venom Shortness Of Breath, Swelling and Other (See Comments)    Reaction:  Facial swelling  . Food Shortness Of Breath, Rash and Other (See Comments)    Pt states that he is allergic to strawberries.    . Aspirin Other (See Comments)    Reaction:  GI upset   . Metolazone Other (See Comments)    Pt states that he stopped taking this med due to heart attack like symptoms.   . Oatmeal Nausea And Vomiting  . Orange  Juice [Orange Oil] Nausea And Vomiting    All acidic products make him nauseous and upset stomach  . Torsemide Swelling and Other (See Comments)    Reaction:  Swelling of feet/legs   . Diltiazem Palpitations  . Hydralazine Palpitations  . Lipitor [Atorvastatin] Other (See Comments)    Reaction:  Nose bleeds     Family History  Problem Relation Age of Onset  . Stroke Father   . Heart attack Father   . Aneurysm Mother        Cerebral aneurysm  . Hypertension Sister   . Colon cancer Neg Hx   . Inflammatory bowel disease Neg Hx   . Liver disease Neg Hx     Prior to Admission medications   Medication Sig Start Date End Date Taking?  Authorizing Provider  albuterol (PROVENTIL) (2.5 MG/3ML) 0.083% nebulizer solution Take 2.5 mg by nebulization every 6 (six) hours as needed for wheezing or shortness of breath.   Yes [provider]  aspirin 81 MG chewable tablet Chew 1 tablet (81 mg total) by mouth daily. Patient taking differently: Chew 81 mg by mouth every other day.  04/13/17  Yes Erick Blinks, MD  atorvastatin (LIPITOR) 40 MG tablet Take 1 tablet (40 mg total) by mouth daily at 6 PM. 06/25/16  Yes Kathlen Mody, MD  beclomethasone (QVAR) 80 MCG/ACT inhaler Inhale 2 puffs into the lungs 2 (two) times daily.    Yes [provider]  bumetanide (BUMEX) 2 MG tablet Take 2 mg by mouth 2 (two) times daily.   Yes [provider]  carvedilol (COREG) 25 MG tablet Take 1.5 tablets (37.5 mg total) by mouth 2 (two) times daily with a meal. 10/07/17  Yes [name], MD  docusate sodium (COLACE) 100 MG capsule Take 1 capsule (100 mg total) by mouth 2 (two) times daily. Patient taking differently: Take 100 mg by mouth 2 (two) times daily as needed for mild constipation.  06/25/16  Yes Kathlen Mody, MD  ferrous gluconate (FERGON) 324 MG tablet Take 1 tablet (324 mg total) by mouth 2 (two) times daily with a meal. Patient taking differently: Take 324 mg by mouth daily with breakfast.  06/25/16  Yes Kathlen Mody, MD  FLOVENT HFA 220 MCG/ACT inhaler Inhale 1-2 puffs into the lungs every 4 (four) hours as needed. 03/24/18  Yes [provider]  OXYGEN Inhale 3.5 L into the lungs daily.   Yes [provider]  pantoprazole (PROTONIX) 40 MG tablet Take 1 tablet (40 mg total) by mouth daily at 6 (six) AM. Patient taking differently: Take 40 mg by mouth every evening.  06/26/16  Yes Kathlen Mody, MD  potassium chloride (K-DUR,KLOR-CON) 20 MEQ tablet Take 1 tablet (20 mEq total) by mouth daily. 02/20/17  Yes Houston Siren, MD  rivaroxaban (XARELTO) 20 MG TABS tablet Take 1 tablet (20 mg total) by mouth daily  with breakfast. Patient taking differently: Take 20 mg by mouth daily with supper.  06/26/16  Yes Kathlen Mody, MD  spironolactone (ALDACTONE) 100 MG tablet Take 100 mg by mouth daily. For fluid 03/18/17  Yes [provider]  tamsulosin (FLOMAX) 0.4 MG CAPS capsule Take 1 capsule (0.4 mg total) by mouth daily. 06/25/16  Yes Kathlen Mody, MD  doxycycline (MONODOX) 100 MG capsule Take 1 capsule by mouth 2 (two) times daily. 03/31/18   [provider]  potassium chloride 20 MEQ TBCR Take 20 mEq by mouth 2 (two) times daily. 09/03/15 09/03/15  Ward, Layla Maw,  DO    Physical Exam: Vitals:   06/11/18 0730 06/11/18 0800 06/11/18 0900 06/11/18 1000  BP: (!) 145/85 (!) 163/108 (!) 164/98 (!) 164/98  Pulse: (!) 109 (!) 44 89 86  Resp: 18 (!) 22 17 16   Temp:      TempSrc:      SpO2: 99% 99% 100% 100%  Weight:      Height:        Constitutional: NAD, calm, comfortable, obese Vitals:   06/11/18 0730 06/11/18 0800 06/11/18 0900 06/11/18 1000  BP: (!) 145/85 (!) 163/108 (!) 164/98 (!) 164/98  Pulse: (!) 109 (!) 44 89 86  Resp: 18 (!) 22 17 16   Temp:      TempSrc:      SpO2: 99% 99% 100% 100%  Weight:      Height:       Eyes: lids and conjunctivae normal ENMT: Mucous membranes are moist.  Neck: normal, supple Respiratory: clear to auscultation bilaterally. Normal respiratory effort. No accessory muscle use.  Currently on 2.5 L nasal cannula. Cardiovascular: Regular rate and rhythm, no murmurs.  Bilateral pitting edema noted to lower extremities. Abdomen: no tenderness, no distention. Bowel sounds positive.  Musculoskeletal:  No joint deformity upper and lower extremities.   Skin: no rashes, lesions, ulcers.  Psychiatric: Normal judgment and insight. Alert and oriented x 3. Normal mood.   Labs on Admission: I have personally reviewed following labs and imaging studies  CBC: Recent Labs  Lab 06/11/18 0522  WBC 4.6  NEUTROABS 2.9  HGB 13.0  HCT 41.1  MCV 90.9  PLT 192    Basic Metabolic Panel: Recent Labs  Lab 06/11/18 0522  NA 141  K 2.8*  CL 104  CO2 29  GLUCOSE 101*  BUN 12  CREATININE 1.23  CALCIUM 8.5*   GFR: Estimated Creatinine Clearance: 115.5 mL/min (by C-G formula based on SCr of 1.23 mg/dL). Liver Function Tests: No results for input(s): AST, ALT, ALKPHOS, BILITOT, PROT, ALBUMIN in the last 168 hours. No results for input(s): LIPASE, AMYLASE in the last 168 hours. No results for input(s): AMMONIA in the last 168 hours. Coagulation Profile: No results for input(s): INR, PROTIME in the last 168 hours. Cardiac Enzymes: No results for input(s): CKTOTAL, CKMB, CKMBINDEX, TROPONINI in the last 168 hours. BNP (last 3 results) No results for input(s): PROBNP in the last 8760 hours. HbA1C: No results for input(s): HGBA1C in the last 72 hours. CBG: No results for input(s): GLUCAP in the last 168 hours. Lipid Profile: No results for input(s): CHOL, HDL, LDLCALC, TRIG, CHOLHDL, LDLDIRECT in the last 72 hours. Thyroid Function Tests: No results for input(s): TSH, T4TOTAL, FREET4, T3FREE, THYROIDAB in the last 72 hours. Anemia Panel: No results for input(s): VITAMINB12, FOLATE, FERRITIN, TIBC, IRON, RETICCTPCT in the last 72 hours. Urine analysis:    Component Value Date/Time   COLORURINE YELLOW 04/01/2018 0332   APPEARANCEUR CLEAR 04/01/2018 0332   LABSPEC 1.008 04/01/2018 0332   PHURINE 7.0 04/01/2018 0332   GLUCOSEU NEGATIVE 04/01/2018 0332   GLUCOSEU NEG mg/dL 91/47/8295 6213   HGBUR NEGATIVE 04/01/2018 0332   BILIRUBINUR NEGATIVE 04/01/2018 0332   KETONESUR NEGATIVE 04/01/2018 0332   PROTEINUR NEGATIVE 04/01/2018 0332   UROBILINOGEN 0.2 08/08/2014 1445   NITRITE NEGATIVE 04/01/2018 0332   LEUKOCYTESUR NEGATIVE 04/01/2018 0332    Radiological Exams on Admission: Dg Chest Port 1 View  Result Date: 06/11/2018 CLINICAL DATA:  Shortness of breath.  History of cardiomyopathy. EXAM: PORTABLE CHEST 1 VIEW COMPARISON:  Chest  radiograph April 01, 2018 FINDINGS: Cardiac silhouette is similarly enlarged. Pulmonary vascular congestion and interstitial prominence. No pleural effusion or focal consolidation. No pneumothorax. Large body habitus. Osseous structures are non suspicious. IMPRESSION: 1. Similar cardiomegaly.  Increased interstitial edema. Electronically Signed   By: Awilda Metroourtnay  Bloomer M.D.   On: 06/11/2018 05:46    EKG: Independently reviewed.  Atrial fibrillation at 89 bpm.  Assessment/Plan Principal Problem:   Acute on chronic combined systolic and diastolic heart failure (HCC) Active Problems:   Hyperlipidemia   OBESITY, MORBID   Essential hypertension   GERD (gastroesophageal reflux disease)   Hypokalemia   OSA (obstructive sleep apnea)   Nonischemic cardiomyopathy (HCC)   Chronic anticoagulation-Xarelto    1. Acute on chronic combined systolic and diastolic heart failure decompensation.  This is related to inadequate home medication use over the last 2 to 3 weeks which has resulted in weight gain.  We will plan to continue diuresis with IV Lasix 80 mg 3 times daily and monitor daily weights as well as strict inputs and outputs as Bumex is on national back order.  No need for repeat echocardiogram at this time.  Monitor on telemetry and maintain on Coreg.  Maintain on spironolactone. 2. Hypokalemia.  Replete oral and IV and monitor with repeat BMP.  Check magnesium. 3. Uncontrolled hypertension.  Should improve with aggressive diuresis.  Maintain on Coreg and spironolactone as well. 4. Atrial fibrillation.  Currently rate controlled.  Will maintain on carvedilol and monitor on telemetry.  Xarelto for anticoagulation. 5. Dyslipidemia.  Maintain on atorvastatin 40 mg. 6. GERD.  PPI. 7. BPH.  Maintain on tamsulosin.   DVT prophylaxis: On Xarelto Code Status: Full Family Communication: None at bedside Disposition Plan: Admit for diuresis Consults called: None Admission status: Inpatient,  telemetry   Pratik Hoover BrunetteD Shah DO [facility] Pager [phone]  If 7PM-7AM, please contact night-coverage [email or web address] Password [email or web address]  06/11/2018, 10:35 AM

## 2018-06-11 NOTE — ED Provider Notes (Signed)
[facility]Y [facility] Provider Note   CSN: [id number] Arrival date & time: 06/11/18  0505     History   Chief Complaint Chief Complaint  Patient presents with  . Chest Pain    HPI [name] is a 52 y.o. male.  The history is provided by the patient.  He has history of hyperlipidemia, atrial fibrillation, systolic heart failure, chronic kidney disease and comes in with worsening dyspnea and peripheral edema over the last 3 days.  He states that he is gained about 10 pounds over the last 2 months, and probably gained another 3-5 pounds in the last 3 days.  He had run out of his medications, and medications which were supposed to be sent by mail order have not arrived.  Dyspnea is worse with exertion with laying flat.  Chest pain is in the midsternal area without radiation.  He describes it as sharp and dull and heavy and rates it at 5/10.  He denies any cough.  There has been no vomiting or diarrhea.  Past Medical History:  Diagnosis Date  . Allergic rhinitis   . Asthma   . Atrial fibrillation (HCC)   . CHF (congestive heart failure) (HCC)   . Essential hypertension   . Gastroesophageal reflux disease   . Heart attack (HCC)   . History of cardiac catheterization    Normal coronaries December 2016  . Noncompliance    Major problem leading to declining health and recurrent hospitalization  . Nonischemic cardiomyopathy (HCC)    LVEF 20-25%  . On home O2    3L N/C  . OSA (obstructive sleep apnea)   . Osteoarthritis   . Peptic ulcer disease     Patient Active Problem List   Diagnosis Date Noted  . Acute on chronic combined systolic and diastolic heart failure (HCC) 04/01/2018  . Hypomagnesemia 04/01/2018  . Acute on chronic diastolic CHF (congestive heart failure) (HCC) 03/11/2018  . Pressure injury of skin 03/11/2018  . CHF (congestive heart failure) (HCC) 09/08/2017  . Altered mental status 05/11/2017  . Acute exacerbation of CHF (congestive heart  failure) (HCC) 04/20/2017  . Acute on chronic systolic (congestive) heart failure (HCC) 04/19/2017  . CHF exacerbation (HCC) 04/11/2017  . Chronic respiratory failure with hypoxia (HCC) 04/11/2017  . Encounter for hospice care discussion   . Palliative care encounter   . Goals of care, counseling/discussion   . DNR (do not resuscitate)   . DNR (do not resuscitate) discussion   . Chronic congestive heart failure (HCC) 02/18/2017  . Acute systolic heart failure (HCC) 01/18/2017  . Pedal edema 12/02/2016  . Acute CHF (congestive heart failure) (HCC) 11/09/2016  . Acute on chronic systolic CHF (congestive heart failure) (HCC) 08/19/2016  . CKD (chronic kidney disease), stage II 05/17/2016  . Coronary arteries, normal Dec 2016 01/23/2016  . Chronic anticoagulation-Xarelto 01/23/2016  . NSVT (nonsustained ventricular tachycardia) (HCC) 01/23/2016  . Elevated troponin 12/21/2015  . Nonischemic cardiomyopathy (HCC)   . Morbid obesity due to excess calories (HCC)   . Noncompliance with diet and medication regimen 12/02/2015  . OSA (obstructive sleep apnea) 09/20/2015  . Pulmonary hypertension (HCC) 09/19/2015  . Normocytic anemia 09/19/2015  . Atrial fibrillation, chronic   . Dyspnea on exertion 05/28/2015  . Acute respiratory failure with hypoxia (HCC) 04/01/2015  . Hypokalemia 04/01/2015  . Obesity hypoventilation syndrome (HCC) 08/08/2014  . GERD (gastroesophageal reflux disease) 08/08/2014  . Edema of right lower extremity 06/21/2014  . Fecal urgency 06/21/2014  .  Asthma 02/11/2009  . Hyperlipidemia 07/12/2008  . OBESITY, MORBID 07/12/2008  . Anxiety state 07/12/2008  . DEPRESSION 07/12/2008  . Essential hypertension 07/12/2008  . ALLERGIC RHINITIS 07/12/2008  . Peptic ulcer 07/12/2008  . OSTEOARTHRITIS 07/12/2008    Past Surgical History:  Procedure Laterality Date  . CARDIAC CATHETERIZATION N/A 06/14/2015   Procedure: Right/Left Heart Cath and Coronary Angiography;   Surgeon: Runell Gess, MD;  Location: [facility] INVASIVE CV LAB;  Service: Cardiovascular;  Laterality: N/A;        Home Medications    Prior to Admission medications   Medication Sig Start Date End Date Taking? Authorizing Provider  albuterol (PROVENTIL) (2.5 MG/3ML) 0.083% nebulizer solution Take 2.5 mg by nebulization every 6 (six) hours as needed for wheezing or shortness of breath.    [provider]  aspirin 81 MG chewable tablet Chew 1 tablet (81 mg total) by mouth daily. Patient taking differently: Chew 81 mg by mouth every other day.  04/13/17   Erick Blinks, MD  atorvastatin (LIPITOR) 40 MG tablet Take 1 tablet (40 mg total) by mouth daily at 6 PM. 06/25/16   Kathlen Mody, MD  beclomethasone (QVAR) 80 MCG/ACT inhaler Inhale 2 puffs into the lungs 2 (two) times daily.     [provider]  bumetanide (BUMEX) 2 MG tablet Take 2 mg by mouth 2 (two) times daily.    [provider]  carvedilol (COREG) 25 MG tablet Take 1.5 tablets (37.5 mg total) by mouth 2 (two) times daily with a meal. 10/07/17   [name], MD  docusate sodium (COLACE) 100 MG capsule Take 1 capsule (100 mg total) by mouth 2 (two) times daily. Patient taking differently: Take 100 mg by mouth 2 (two) times daily as needed for mild constipation.  06/25/16   Kathlen Mody, MD  doxycycline (MONODOX) 100 MG capsule Take 1 capsule by mouth 2 (two) times daily. 03/31/18   [provider]  ferrous gluconate (FERGON) 324 MG tablet Take 1 tablet (324 mg total) by mouth 2 (two) times daily with a meal. Patient taking differently: Take 324 mg by mouth daily with breakfast.  06/25/16   Kathlen Mody, MD  FLOVENT HFA 220 MCG/ACT inhaler Inhale 1-2 puffs into the lungs every 4 (four) hours as needed. 03/24/18   [provider]  lisinopril (PRINIVIL,ZESTRIL) 5 MG tablet Take 1 tablet (5 mg total) by mouth daily. 10/07/17   [name], MD  OXYGEN Inhale 3.5 L into the lungs daily.     [provider]  pantoprazole (PROTONIX) 40 MG tablet Take 1 tablet (40 mg total) by mouth daily at 6 (six) AM. Patient taking differently: Take 40 mg by mouth every evening.  06/26/16   Kathlen Mody, MD  potassium chloride (K-DUR,KLOR-CON) 20 MEQ tablet Take 1 tablet (20 mEq total) by mouth daily. 02/20/17   Houston Siren, MD  rivaroxaban (XARELTO) 20 MG TABS tablet Take 1 tablet (20 mg total) by mouth daily with breakfast. Patient taking differently: Take 20 mg by mouth daily with supper.  06/26/16   Kathlen Mody, MD  spironolactone (ALDACTONE) 100 MG tablet Take 100 mg by mouth daily. For fluid 03/18/17   [provider]  tamsulosin (FLOMAX) 0.4 MG CAPS capsule Take 1 capsule (0.4 mg total) by mouth daily. 06/25/16   Kathlen Mody, MD  potassium chloride 20 MEQ TBCR Take 20 mEq by mouth 2 (two) times daily. 09/03/15 09/03/15  Ward, Layla Maw, DO    Family History Family  History  Problem Relation Age of Onset  . Stroke Father   . Heart attack Father   . Aneurysm Mother        Cerebral aneurysm  . Hypertension Sister   . Colon cancer Neg Hx   . Inflammatory bowel disease Neg Hx   . Liver disease Neg Hx     Social History Social History   Tobacco Use  . Smoking status: Former Smoker    Packs/day: 0.50    Years: 20.00    Pack years: 10.00    Types: Cigarettes    Start date: 04/26/1988    Last attempt to quit: 06/23/2007    Years since quitting: 10.9  . Smokeless tobacco: Never Used  . Tobacco comment: 1 ppd former smoker  Substance Use Topics  . Alcohol use: No    Alcohol/week: 0.0 standard drinks    Comment: No etoh since 2009  . Drug use: No     Allergies   Banana; Bee venom; Food; Aspirin; Metolazone; Oatmeal; Orange juice [orange oil]; Torsemide; Diltiazem; Hydralazine; and Lipitor [atorvastatin]   Review of Systems Review of Systems  All other systems reviewed and are negative.    Physical Exam Updated Vital Signs BP (!) 197/69 (BP Location: Right  Arm)   Pulse 87   Temp 98.6 F (37 C) (Oral)   Resp (!) 23   Ht 6' (1.829 m)   Wt (!) 167.8 kg   SpO2 100%   BMI 50.18 kg/m   Physical Exam Vitals signs and nursing note reviewed.    52 year old male, resting comfortably and in no acute distress. Vital signs are significant for elevated respiratory rate and systolic blood pressure. Oxygen saturation is 100%, which is normal. Head is normocephalic and atraumatic. PERRLA, EOMI. Oropharynx is clear. Neck is nontender and supple without adenopathy or JVD. Back is nontender and there is no CVA tenderness. Lungs have distant sounds throughout without overt rales, wheezes, or rhonchi. Chest is nontender. Heart has regular rate and rhythm without murmur. Abdomen is soft, flat, nontender without masses or hepatosplenomegaly and peristalsis is normoactive. Extremities have 2+ edema, full range of motion is present. Skin is warm and dry without rash. Neurologic: Mental status is normal, cranial nerves are intact, there are no motor or sensory deficits.  ED Treatments / Results  Labs (all labs ordered are listed, but only abnormal results are displayed) Labs Reviewed  BRAIN NATRIURETIC PEPTIDE - Abnormal; Notable for the following components:      Result Value   B Natriuretic Peptide 188.0 (*)    All other components within normal limits  BASIC METABOLIC PANEL - Abnormal; Notable for the following components:   Potassium 2.8 (*)    Glucose, Bld 101 (*)    Calcium 8.5 (*)    All other components within normal limits  CBC WITH DIFFERENTIAL/PLATELET  I-STAT TROPONIN, ED    EKG EKG Interpretation  Date/Time:  Saturday June 11 2018 05:10:57 EST Ventricular Rate:  89 PR Interval:    QRS Duration: 86 QT Interval:  351 QTC Calculation: 427 R Axis:   57 Text Interpretation:  Atrial fibrillation Nonspecific repol abnormality, diffuse leads When compared with ECG of 04/01/2018, Premature ventricular complexes are no longer present  Confirmed by [name] ([id number]) on 06/11/2018 5:14:10 AM   Radiology Dg Chest Port 1 View  Result Date: 06/11/2018 CLINICAL DATA:  Shortness of breath.  History of cardiomyopathy. EXAM: PORTABLE CHEST 1 VIEW COMPARISON:  Chest radiograph April 01, 2018 FINDINGS:  Cardiac silhouette is similarly enlarged. Pulmonary vascular congestion and interstitial prominence. No pleural effusion or focal consolidation. No pneumothorax. Large body habitus. Osseous structures are non suspicious. IMPRESSION: 1. Similar cardiomegaly.  Increased interstitial edema. Electronically Signed   By: Awilda Metro M.D.   On: 06/11/2018 05:46    Procedures Procedures  CRITICAL CARE Performed by: [name] Total critical care time: 40 minutes Critical care time was exclusive of separately billable procedures and treating other patients. Critical care was necessary to treat or prevent imminent or life-threatening deterioration. Critical care was time spent personally by me on the following activities: development of treatment plan with patient and/or surrogate as well as nursing, discussions with consultants, evaluation of patient's response to treatment, examination of patient, obtaining history from patient or surrogate, ordering and performing treatments and interventions, ordering and review of laboratory studies, ordering and review of radiographic studies, pulse oximetry and re-evaluation of patient's condition.  Medications Ordered in ED Medications  furosemide (LASIX) injection 80 mg (has no administration in time range)  aspirin chewable tablet 324 mg (has no administration in time range)  alum & mag hydroxide-simeth (MAALOX/MYLANTA) 200-200-20 MG/5ML suspension 30 mL (has no administration in time range)     Initial Impression / Assessment and Plan / ED Course  I have reviewed the triage vital signs and the nursing notes.  Pertinent labs & imaging results that were available during my care of the  patient were reviewed by me and considered in my medical decision making (see chart for details).  Exacerbation of heart failure which, at least partly, is secondary to medication noncompliance.  Old records are reviewed, and he does have a recent hospitalization for heart failure.  ECG shows no acute changes, chronic atrial fibrillation.  Will check chest x-ray.  He will be given intravenous furosemide.  Screening labs are obtained as well as chest x-ray.  He had modest diuresis from furosemide.  Chest x-ray does show increased pulmonary edema over baseline.  Labs are significant for mildly elevated BNP, and moderately severe hypokalemia.  This will be worse with additional diuresis in the emergency department.  He is given oral and intravenous potassium.  Case is discussed with Dr. Clelia Croft of [facility], who agrees to admit the patient.  CHA2DS2/VAS Stroke Risk Points  Current as of 4 minutes ago     2 >= 2 Points: High Risk  1 - 1.99 Points: Medium Risk  0 Points: Low Risk    This is the only CHA2DS2/VAS Stroke Risk Points available for the past  year.:  Last Change: N/A     Details    This score determines the patient's risk of having a stroke if the  patient has atrial fibrillation.       Points Metrics  1 Has Congestive Heart Failure:  Yes    Current as of 4 minutes ago  0 Has Vascular Disease:  No    Current as of 4 minutes ago  1 Has Hypertension:  Yes    Current as of 4 minutes ago  0 Age:  27    Current as of 4 minutes ago  0 Has Diabetes:  No    Current as of 4 minutes ago  0 Had Stroke:  No  Had TIA:  No  Had thromboembolism:  No    Current as of 4 minutes ago  0 Male:  No    Current as of 4 minutes ago  Final Clinical Impressions(s) / ED Diagnoses   Final diagnoses:  Acute on chronic diastolic heart failure (HCC)  Diuretic-induced hypokalemia  Chronic atrial fibrillation    ED Discharge Orders    None       [name], MD 06/11/18  [phone]

## 2018-06-12 LAB — CBC
HCT: 41.1 % (ref 39.0–52.0)
Hemoglobin: 12.7 g/dL — ABNORMAL LOW (ref 13.0–17.0)
MCH: 28.1 pg (ref 26.0–34.0)
MCHC: 30.9 g/dL (ref 30.0–36.0)
MCV: 90.9 fL (ref 80.0–100.0)
Platelets: 191 10*3/uL (ref 150–400)
RBC: 4.52 MIL/uL (ref 4.22–5.81)
RDW: 13.9 % (ref 11.5–15.5)
WBC: 5.5 10*3/uL (ref 4.0–10.5)
nRBC: 0 % (ref 0.0–0.2)

## 2018-06-12 LAB — BASIC METABOLIC PANEL
Anion gap: 7 (ref 5–15)
BUN: 15 mg/dL (ref 6–20)
CALCIUM: 8.5 mg/dL — AB (ref 8.9–10.3)
CO2: 29 mmol/L (ref 22–32)
Chloride: 105 mmol/L (ref 98–111)
Creatinine, Ser: 1.11 mg/dL (ref 0.61–1.24)
GFR calc Af Amer: >60 mL/min (ref >60)
GFR calc non Af Amer: >60 mL/min (ref >60)
GLUCOSE: 92 mg/dL (ref 70–99)
Potassium: 3.2 mmol/L — ABNORMAL LOW (ref 3.5–5.1)
Sodium: 141 mmol/L (ref 135–145)

## 2018-06-12 MED ORDER — POTASSIUM CHLORIDE CRYS ER 20 MEQ PO TBCR
40.0000 meq | EXTENDED_RELEASE_TABLET | Freq: Two times a day (BID) | ORAL | Status: DC
  Administered 2018-06-12 (×2): 40 meq via ORAL
  Filled 2018-06-12 (×2): qty 2

## 2018-06-12 NOTE — Progress Notes (Signed)
PROGRESS NOTE    [name]  VWU:[id number] DOB: 27-Oct-1965 DOA: 06/11/2018 PCP: Pearson Grippe, MD   Brief Narrative:  Per HPI: [name] is a 52 y.o. male with medical history significant for chronic combined systolic and diastolic heart failure, nonischemic cardiomyopathy, obstructive sleep apnea on CPAP with chronic home oxygen, hypertension, dyslipidemia, and obesity who states that he has been on an inadequate amount of his home Bumex approximately 1 mg twice a day as opposed to 2 mg 3 times daily for the last couple weeks.  He states that this is because his pharmacy was unable to provide him with an adequate dosage and he has noted a slow, insidious weight gain of over 10 pounds.  He states that he has had worsening lower extremity edema as well and now dyspnea that is worse with exertion and laying flat that brought him to the ED.  He actually denies any chest pain, nor does he have any nausea or vomiting or diarrhea.  He denies any cough, fevers, or chills.  He has been admitted with acute on chronic combined systolic and diastolic heart failure decompensation with adequate diuresis noted overnight.  He has started to feel some better.  Assessment & Plan:   Principal Problem:   Acute on chronic combined systolic and diastolic heart failure (HCC) Active Problems:   Hyperlipidemia   OBESITY, MORBID   Essential hypertension   GERD (gastroesophageal reflux disease)   Hypokalemia   OSA (obstructive sleep apnea)   Nonischemic cardiomyopathy (HCC)   Chronic anticoagulation-Xarelto  1. Acute on chronic combined systolic and diastolic heart failure decompensation.  This is related to inadequate home medication use over the last 2 to 3 weeks which has resulted in weight gain.  We will plan to continue diuresis with IV Lasix 80 mg 3 times daily and monitor daily weights as well as strict inputs and outputs as Bumex is on national back order.  No need for repeat echocardiogram at this  time.  Monitor on telemetry and maintain on Coreg.  Maintain on spironolactone. 2. Hypokalemia.    Continue to replete oral potassium and recheck BMP in a.m.  Check magnesium. 3. Uncontrolled hypertension.  Should improve with aggressive diuresis.  Maintain on Coreg and spironolactone as well. 4. Atrial fibrillation.  Currently rate controlled.  Will maintain on carvedilol and monitor on telemetry.  Xarelto for anticoagulation. 5. Dyslipidemia.  Maintain on atorvastatin 40 mg. 6. GERD.  PPI. 7. BPH.  Maintain on tamsulosin.  DVT prophylaxis: On Xarelto Code Status: Full Family Communication: None Disposition Plan: Continue IV diuresis   Consultants:   None  Procedures:   None  Antimicrobials:   None   Subjective: Patient seen and evaluated today with no new acute complaints or concerns. No acute concerns or events noted overnight.  He states that his swelling is starting to come down and he is feeling less short of breath.  Objective: Vitals:   06/11/18 2005 06/11/18 2125 06/12/18 0532 06/12/18 0852  BP:  111/66 123/82   Pulse:  60 67   Resp:  20 18   Temp:  97.8 F (36.6 C) 98.6 F (37 C)   TempSrc:  Oral Oral   SpO2: 95% 96% 100% 99%  Weight:   (!) 167.8 kg   Height:        Intake/Output Summary (Last 24 hours) at 06/12/2018 0947 Last data filed at 06/12/2018 0700 Gross per 24 hour  Intake 1012.11 ml  Output 3950 ml  Net -  2937.89 ml   Filed Weights   06/11/18 0508 06/11/18 1300 06/12/18 0532  Weight: (!) 167.8 kg (!) 172.2 kg (!) 167.8 kg    Examination:  General exam: Appears calm and comfortable  Respiratory system: Clear to auscultation. Respiratory effort normal. Cardiovascular system: S1 & S2 heard, RRR. No JVD, murmurs, rubs, gallops or clicks. No pedal edema. Gastrointestinal system: Abdomen is nondistended, soft and nontender. No organomegaly or masses felt. Normal bowel sounds heard. Central nervous system: Alert and oriented. No focal  neurological deficits. Extremities: Symmetric 5 x 5 power. Skin: No rashes, lesions or ulcers Psychiatry: Judgement and insight appear normal. Mood & affect appropriate.     Data Reviewed: I have personally reviewed following labs and imaging studies  CBC: Recent Labs  Lab 06/11/18 0522 06/12/18 0601  WBC 4.6 5.5  NEUTROABS 2.9  --   HGB 13.0 12.7*  HCT 41.1 41.1  MCV 90.9 90.9  PLT 192 191   Basic Metabolic Panel: Recent Labs  Lab 06/11/18 0522 06/12/18 0601  NA 141 141  K 2.8* 3.2*  CL 104 105  CO2 29 29  GLUCOSE 101* 92  BUN 12 15  CREATININE 1.23 1.11  CALCIUM 8.5* 8.5*  MG 1.9  --    GFR: Estimated Creatinine Clearance: 128 mL/min (by C-G formula based on SCr of 1.11 mg/dL). Liver Function Tests: No results for input(s): AST, ALT, ALKPHOS, BILITOT, PROT, ALBUMIN in the last 168 hours. No results for input(s): LIPASE, AMYLASE in the last 168 hours. No results for input(s): AMMONIA in the last 168 hours. Coagulation Profile: No results for input(s): INR, PROTIME in the last 168 hours. Cardiac Enzymes: No results for input(s): CKTOTAL, CKMB, CKMBINDEX, TROPONINI in the last 168 hours. BNP (last 3 results) No results for input(s): PROBNP in the last 8760 hours. HbA1C: No results for input(s): HGBA1C in the last 72 hours. CBG: No results for input(s): GLUCAP in the last 168 hours. Lipid Profile: No results for input(s): CHOL, HDL, LDLCALC, TRIG, CHOLHDL, LDLDIRECT in the last 72 hours. Thyroid Function Tests: No results for input(s): TSH, T4TOTAL, FREET4, T3FREE, THYROIDAB in the last 72 hours. Anemia Panel: No results for input(s): VITAMINB12, FOLATE, FERRITIN, TIBC, IRON, RETICCTPCT in the last 72 hours. Sepsis Labs: No results for input(s): PROCALCITON, LATICACIDVEN in the last 168 hours.  No results found for this or any previous visit (from the past 240 hour(s)).       Radiology Studies: Dg Chest Port 1 View  Result Date: 06/11/2018 CLINICAL  DATA:  Shortness of breath.  History of cardiomyopathy. EXAM: PORTABLE CHEST 1 VIEW COMPARISON:  Chest radiograph April 01, 2018 FINDINGS: Cardiac silhouette is similarly enlarged. Pulmonary vascular congestion and interstitial prominence. No pleural effusion or focal consolidation. No pneumothorax. Large body habitus. Osseous structures are non suspicious. IMPRESSION: 1. Similar cardiomegaly.  Increased interstitial edema. Electronically Signed   By: Awilda Metroourtnay  Bloomer M.D.   On: 06/11/2018 05:46        Scheduled Meds: . aspirin  81 mg Oral Daily  . atorvastatin  40 mg Oral q1800  . budesonide (PULMICORT) nebulizer solution  0.5 mg Nebulization BID  . carvedilol  37.5 mg Oral BID WC  . docusate sodium  100 mg Oral BID  . ferrous gluconate  324 mg Oral BID WC  . furosemide  80 mg Intravenous Q8H  . pantoprazole  40 mg Oral Q0600  . potassium chloride  40 mEq Oral BID  . rivaroxaban  20 mg Oral Q breakfast  .  sodium chloride flush  3 mL Intravenous Q12H  . spironolactone  100 mg Oral Daily  . tamsulosin  0.4 mg Oral Daily   Continuous Infusions: . sodium chloride       LOS: 1 day    Time spent: 30 minutes    Marquisa Salih Hoover Brunette, DO [facility] Pager [phone]  If 7PM-7AM, please contact night-coverage [email or web address] Password [facility] 06/12/2018, 9:47 AM

## 2018-06-13 LAB — BASIC METABOLIC PANEL
Anion gap: 8 (ref 5–15)
BUN: 19 mg/dL (ref 6–20)
CO2: 27 mmol/L (ref 22–32)
Calcium: 8.6 mg/dL — ABNORMAL LOW (ref 8.9–10.3)
Chloride: 106 mmol/L (ref 98–111)
Creatinine, Ser: 1.23 mg/dL (ref 0.61–1.24)
Glucose, Bld: 83 mg/dL (ref 70–99)
Potassium: 3.3 mmol/L — ABNORMAL LOW (ref 3.5–5.1)
Sodium: 141 mmol/L (ref 135–145)

## 2018-06-13 MED ORDER — POTASSIUM CHLORIDE CRYS ER 20 MEQ PO TBCR
40.0000 meq | EXTENDED_RELEASE_TABLET | Freq: Three times a day (TID) | ORAL | Status: DC
  Administered 2018-06-13: 40 meq via ORAL
  Filled 2018-06-13: qty 2

## 2018-06-13 MED ORDER — BUMETANIDE 2 MG PO TABS: 2.0000 mg | ORAL_TABLET | Freq: Three times a day (TID) | ORAL | 3 refills | Status: DC

## 2018-06-13 MED ORDER — POTASSIUM CHLORIDE CRYS ER 20 MEQ PO TBCR: 20.0000 meq | EXTENDED_RELEASE_TABLET | Freq: Two times a day (BID) | ORAL | 2 refills | Status: DC

## 2018-06-13 NOTE — Care Management Important Message (Signed)
Important Message  Patient Details  Name: [name] MRN: [id number] Date of Birth: 11/15/1965   Medicare Important Message Given:  Yes    Renie Ora 06/13/2018, 12:00 PM

## 2018-06-13 NOTE — Progress Notes (Signed)
Patient discharged home today per MD orders. Patient vital signs WDL. IV removed and site WDL. Discharge Instructions including follow up appointments, medications, and education reviewed with patient. Patient verbalizes understanding. Patient is transported out via wheelchair.  

## 2018-06-13 NOTE — Discharge Summary (Signed)
Physician Discharge Summary  [name] OZH:[id number] DOB: 20-Apr-1966 DOA: 06/11/2018  PCP: Pearson Grippe, MD  Admit date: 06/11/2018  Discharge date: 06/13/2018  Admitted From:Home  Disposition:  Home  Recommendations for Outpatient Follow-up:  1. Follow up with PCP in 1-2 weeks 2. Please obtain BMP in one week to assess renal function and potassium levels 3. Patient has home oxygen as needed  Home Health: None  Equipment/Devices: None  Discharge Condition: Stable  CODE STATUS: Full  Diet recommendation: Heart Healthy  Brief/Interim Summary: Per HPI: Samuel T Archeris a 52 y.o.malewith medical history significant forchronic combined systolic and diastolic heart failure, nonischemic cardiomyopathy, obstructive sleep apnea on CPAP with chronic home oxygen, hypertension, dyslipidemia, and obesity who states that he has been on an inadequate amount of his home Bumex approximately 1 mg twice a day as opposed to 2 mg 3 times daily for the last couple weeks. He states that this is because his pharmacy was unable to provide him with an adequate dosage and he has noted a slow, insidious weight gain of over 10 pounds. He states that he has had worsening lower extremity edema as well and now dyspnea that is worse with exertion and laying flat that brought him to the ED. He actually denies any chest pain, nor does he have any nausea or vomiting or diarrhea. He denies any cough, fevers, or chills.  He has been admitted with acute on chronic combined systolic and diastolic heart failure decompensation and has responded well to diuresis with Lasix 80 mg 3 times daily.  He will be discharged on Bumex 2 mg 3 times a day from twice a day as he states that he has been taking it 3 times a day anyways to maintain his volume status.  He will have an increased amount of potassium to 20 mEq twice a day as well.  He is to maintain his usual dose of spironolactone.  He will need follow-up with his PCP  in the next 1 to 2 weeks to recheck BMP and to ensure that he is not having any worsening heart failure symptoms.  He is otherwise had no other acute events during this brief admission.   Discharge Diagnoses:  Principal Problem:   Acute on chronic combined systolic and diastolic heart failure (HCC) Active Problems:   Hyperlipidemia   OBESITY, MORBID   Essential hypertension   GERD (gastroesophageal reflux disease)   Hypokalemia   OSA (obstructive sleep apnea)   Nonischemic cardiomyopathy (HCC)   Chronic anticoagulation-Xarelto  Principal discharge diagnosis: Acute on chronic combined systolic and diastolic heart failure.  Discharge Instructions  Discharge Instructions    Diet - low sodium heart healthy   Complete by:  As directed    Increase activity slowly   Complete by:  As directed      Allergies as of 06/13/2018      Reactions   Banana Shortness Of Breath   Bee Venom Shortness Of Breath, Swelling, Other (See Comments)   Reaction:  Facial swelling   Food Shortness Of Breath, Rash, Other (See Comments)   Pt states that he is allergic to strawberries.     Aspirin Other (See Comments)   Reaction:  GI upset    Metolazone Other (See Comments)   Pt states that he stopped taking this med due to heart attack like symptoms.    Oatmeal Nausea And Vomiting   Orange Juice [orange Oil] Nausea And Vomiting   All acidic products make him nauseous and upset  stomach   Torsemide Swelling, Other (See Comments)   Reaction:  Swelling of feet/legs    Diltiazem Palpitations   Hydralazine Palpitations   Lipitor [atorvastatin] Other (See Comments)   Reaction:  Nose bleeds       Medication List    TAKE these medications   albuterol (2.5 MG/3ML) 0.083% nebulizer solution Commonly known as:  PROVENTIL Take 2.5 mg by nebulization every 6 (six) hours as needed for wheezing or shortness of breath.   aspirin 81 MG chewable tablet Chew 1 tablet (81 mg total) by mouth daily. What changed:   when to take this   atorvastatin 40 MG tablet Commonly known as:  LIPITOR Take 1 tablet (40 mg total) by mouth daily at 6 PM.   beclomethasone 80 MCG/ACT inhaler Commonly known as:  QVAR Inhale 2 puffs into the lungs 2 (two) times daily.   bumetanide 2 MG tablet Commonly known as:  BUMEX Take 1 tablet (2 mg total) by mouth 3 (three) times daily. What changed:  when to take this   carvedilol 25 MG tablet Commonly known as:  COREG Take 1.5 tablets (37.5 mg total) by mouth 2 (two) times daily with a meal.   docusate sodium 100 MG capsule Commonly known as:  COLACE Take 1 capsule (100 mg total) by mouth 2 (two) times daily. What changed:    when to take this  reasons to take this   doxycycline 100 MG capsule Commonly known as:  MONODOX Take 1 capsule by mouth 2 (two) times daily.   ferrous gluconate 324 MG tablet Commonly known as:  FERGON Take 1 tablet (324 mg total) by mouth 2 (two) times daily with a meal. What changed:  when to take this   FLOVENT HFA 220 MCG/ACT inhaler Generic drug:  fluticasone Inhale 1-2 puffs into the lungs every 4 (four) hours as needed.   OXYGEN Inhale 3.5 L into the lungs daily.   pantoprazole 40 MG tablet Commonly known as:  PROTONIX Take 1 tablet (40 mg total) by mouth daily at 6 (six) AM. What changed:  when to take this   potassium chloride SA 20 MEQ tablet Commonly known as:  K-DUR,KLOR-CON Take 1 tablet (20 mEq total) by mouth 2 (two) times daily. What changed:  when to take this   rivaroxaban 20 MG Tabs tablet Commonly known as:  XARELTO Take 1 tablet (20 mg total) by mouth daily with breakfast. What changed:  when to take this   spironolactone 100 MG tablet Commonly known as:  ALDACTONE Take 100 mg by mouth daily. For fluid   tamsulosin 0.4 MG Caps capsule Commonly known as:  FLOMAX Take 1 capsule (0.4 mg total) by mouth daily.      Follow-up Information    Pearson Grippe, MD Follow up in 1 week(s).   Specialty:   Internal Medicine Contact information: [address] [place] [address] [phone]        [name], MD .   Specialty:  Cardiology Contact information: [address] [place] [address] [phone]          Allergies  Allergen Reactions  . Banana Shortness Of Breath  . Bee Venom Shortness Of Breath, Swelling and Other (See Comments)    Reaction:  Facial swelling  . Food Shortness Of Breath, Rash and Other (See Comments)    Pt states that he is allergic to strawberries.    . Aspirin Other (See Comments)    Reaction:  GI upset   . Metolazone Other (See Comments)    Pt states that he stopped taking this med due to heart attack like symptoms.   . Oatmeal Nausea And Vomiting  . Orange Juice [Orange Oil] Nausea And Vomiting    All acidic products make him nauseous and upset stomach  . Torsemide Swelling and Other (See Comments)    Reaction:  Swelling of feet/legs   . Diltiazem Palpitations  . Hydralazine Palpitations  . Lipitor [Atorvastatin] Other (See Comments)    Reaction:  Nose bleeds     Consultations:  None   Procedures/Studies: Dg Chest Port 1 View  Result Date: 06/11/2018 CLINICAL DATA:  Shortness of breath.  History of cardiomyopathy. EXAM: PORTABLE CHEST 1 VIEW COMPARISON:  Chest radiograph April 01, 2018 FINDINGS: Cardiac silhouette is similarly enlarged. Pulmonary vascular congestion and interstitial prominence. No pleural effusion or focal consolidation. No pneumothorax. Large body habitus. Osseous structures are non suspicious. IMPRESSION: 1. Similar cardiomegaly.  Increased interstitial edema. Electronically Signed   By: Awilda Metro M.D.   On: 06/11/2018 05:46     Discharge Exam: Vitals:   06/12/18 2338 06/13/18 0612  BP: (!) 119/93 130/86  Pulse: 73 74  Resp:  19  Temp: 97.7 F (36.5 C) 98.3 F (36.8 C)  SpO2: 100% 97%   Vitals:   06/12/18 1949 06/12/18 1950 06/12/18 2338 06/13/18 0612  BP:   (!)  119/93 130/86  Pulse:  73 73 74  Resp:  18  19  Temp:   97.7 F (36.5 C) 98.3 F (36.8 C)  TempSrc:   Oral Oral  SpO2: 94% 94% 100% 97%  Weight:      Height:        General: Pt is alert, awake, not in acute distress, obese Cardiovascular: RRR, S1/S2 +, no rubs, no gallops Respiratory: CTA bilaterally, no wheezing, no rhonchi Abdominal: Soft, NT, ND, bowel sounds + Extremities: no edema, no cyanosis    The results of significant diagnostics from this hospitalization (including imaging, microbiology, ancillary and laboratory) are listed below for reference.     Microbiology: No results found for this or any previous visit (from the past 240 hour(s)).   Labs: BNP (last 3 results) Recent Labs    03/10/18 2335 04/01/18 0331 06/11/18 0522  BNP 95.0 115.0* 188.0*   Basic Metabolic Panel: Recent Labs  Lab 06/11/18 0522 06/12/18 0601 06/13/18 0537  NA 141 141 141  K 2.8* 3.2* 3.3*  CL 104 105 106  CO2 29 29 27   GLUCOSE 101* 92 83  BUN 12 15 19   CREATININE 1.23 1.11 1.23  CALCIUM 8.5* 8.5* 8.6*  MG 1.9  --   --    Liver Function Tests: No results for input(s): AST, ALT, ALKPHOS, BILITOT, PROT, ALBUMIN in the last 168 hours. No results for input(s): LIPASE, AMYLASE in the last 168 hours. No results for input(s): AMMONIA in the last 168 hours. CBC: Recent Labs  Lab 06/11/18 0522 06/12/18 0601  WBC 4.6 5.5  NEUTROABS 2.9  --   HGB 13.0 12.7*  HCT 41.1 41.1  MCV 90.9 90.9  PLT 192 191   Cardiac Enzymes: No results for input(s): CKTOTAL, CKMB, CKMBINDEX, TROPONINI in the last 168 hours. BNP: Invalid input(s): POCBNP CBG: No results for input(s): GLUCAP in the last 168 hours. D-Dimer No results for input(s): DDIMER in the last 72 hours. Hgb A1c No results for input(s): HGBA1C in the last 72 hours. Lipid Profile No results for input(s): CHOL, HDL,  LDLCALC, TRIG, CHOLHDL, LDLDIRECT in the last 72 hours. Thyroid function studies No results for input(s):  TSH, T4TOTAL, T3FREE, THYROIDAB in the last 72 hours.  Invalid input(s): FREET3 Anemia work up No results for input(s): VITAMINB12, FOLATE, FERRITIN, TIBC, IRON, RETICCTPCT in the last 72 hours. Urinalysis    Component Value Date/Time   COLORURINE YELLOW 04/01/2018 0332   APPEARANCEUR CLEAR 04/01/2018 0332   LABSPEC 1.008 04/01/2018 0332   PHURINE 7.0 04/01/2018 0332   GLUCOSEU NEGATIVE 04/01/2018 0332   GLUCOSEU NEG mg/dL 16/10/960408/10/2009 [id number]   HGBUR NEGATIVE 04/01/2018 0332   BILIRUBINUR NEGATIVE 04/01/2018 0332   KETONESUR NEGATIVE 04/01/2018 0332   PROTEINUR NEGATIVE 04/01/2018 0332   UROBILINOGEN 0.2 08/08/2014 1445   NITRITE NEGATIVE 04/01/2018 0332   LEUKOCYTESUR NEGATIVE 04/01/2018 0332   Sepsis Labs Invalid input(s): PROCALCITONIN,  WBC,  LACTICIDVEN Microbiology No results found for this or any previous visit (from the past 240 hour(s)).   Time coordinating discharge: 35 minutes  SIGNED:   Erick BlinksPratik D Kamari Bilek, DO [facility] 06/13/2018, 8:43 AM Pager [phone]  If 7PM-7AM, please contact night-coverage [email or web address] Password [email or web address]

## 2018-06-20 ENCOUNTER — Other Ambulatory Visit: Payer: Self-pay | Admitting: *Deleted

## 2018-06-20 NOTE — Patient Outreach (Signed)
[facility]) Care Management  06/20/2018  [name] 1965/12/01 [id number]   EMMI-general discharge, d/c from [facility]  RED ON EMMI ALERT Day # 4 Date: Saturday 06/18/18 1028 Red Alert Reason:Sad/hopeless/anxious/empty? Yes  Insurance: united health care medicare and medicaid of [place]  [facility] admissions  X 3 ED visits x3 in the last 6 months    Outreach attempt # 1  Patient is able to verify HIPAA [facility] Care Management RN reviewed and addressed red alert with patient  [name] reports he  Is doing well He denies sad/hopeless/anxious/empty or needing resources He reports continuing to have to go to hospital to have fluids removed at intervals  CM reviewed [facility] resources for sad/hopeless/anxious/empty s/s but he denied need of resources He mentioned a hoover around and CM discussed getting an Rx or order from Dr Selena Batten to Send to his choice of DME company  Social: [name] continues to live with his mother and family members He gets transportation assist from family members He is independent with all his care    Conditions:HTN, Afib, DM, pulmonary HTN, NSVT, CHF, allergies, asthma, GERD, OSA, DNR   Medications: denies concerns with taking medications as prescribed, affording medications, side effects of medications and questions about medications   Advance Directives: Denies need for assist with advance directives   Consent: [facility] RN CM reviewed [facility] services with patient. Patient gave verbal consent for services.   Advised patient that there will be further automated EMMI- post discharge calls to assess how the patient is doing following the recent hospitalization Advised the patient that another call may be received from a nurse if any of their responses were abnormal. Patient voiced understanding and was appreciative of f/u call.   Plan: [facility] RN CM will close case at this time as patient has been assessed and no needs identified.    Pt encouraged to return a call  to [facility] RN CM prn    Samuel Iyer L. Noelle Penner, RN, BSN, CCM [facility] Telephonic Care Management Care Coordinator Office number [phone] Mobile number [phone]  Main [facility] number [phone] Fax number [phone]

## 2018-06-21 DIAGNOSIS — I480 Paroxysmal atrial fibrillation: Secondary | ICD-10-CM | POA: Diagnosis not present

## 2018-06-21 DIAGNOSIS — I272 Pulmonary hypertension, unspecified: Secondary | ICD-10-CM | POA: Diagnosis not present

## 2018-06-21 DIAGNOSIS — E876 Hypokalemia: Secondary | ICD-10-CM | POA: Diagnosis not present

## 2018-06-21 DIAGNOSIS — I5042 Chronic combined systolic (congestive) and diastolic (congestive) heart failure: Secondary | ICD-10-CM | POA: Diagnosis not present

## 2018-06-21 DIAGNOSIS — I10 Essential (primary) hypertension: Secondary | ICD-10-CM | POA: Diagnosis not present

## 2018-06-22 DIAGNOSIS — I491 Atrial premature depolarization: Secondary | ICD-10-CM | POA: Diagnosis not present

## 2018-06-22 DIAGNOSIS — K219 Gastro-esophageal reflux disease without esophagitis: Secondary | ICD-10-CM | POA: Diagnosis not present

## 2018-06-22 DIAGNOSIS — I110 Hypertensive heart disease with heart failure: Secondary | ICD-10-CM | POA: Diagnosis not present

## 2018-06-22 DIAGNOSIS — Z886 Allergy status to analgesic agent status: Secondary | ICD-10-CM | POA: Diagnosis not present

## 2018-06-22 DIAGNOSIS — Z7901 Long term (current) use of anticoagulants: Secondary | ICD-10-CM | POA: Diagnosis not present

## 2018-06-22 DIAGNOSIS — Z87891 Personal history of nicotine dependence: Secondary | ICD-10-CM | POA: Diagnosis not present

## 2018-06-22 DIAGNOSIS — M109 Gout, unspecified: Secondary | ICD-10-CM | POA: Diagnosis not present

## 2018-06-22 DIAGNOSIS — J449 Chronic obstructive pulmonary disease, unspecified: Secondary | ICD-10-CM | POA: Diagnosis not present

## 2018-06-22 DIAGNOSIS — R0689 Other abnormalities of breathing: Secondary | ICD-10-CM | POA: Diagnosis not present

## 2018-06-22 DIAGNOSIS — R609 Edema, unspecified: Secondary | ICD-10-CM | POA: Diagnosis not present

## 2018-06-22 DIAGNOSIS — I509 Heart failure, unspecified: Secondary | ICD-10-CM | POA: Diagnosis not present

## 2018-06-22 DIAGNOSIS — Z79899 Other long term (current) drug therapy: Secondary | ICD-10-CM | POA: Diagnosis not present

## 2018-06-22 DIAGNOSIS — Z888 Allergy status to other drugs, medicaments and biological substances status: Secondary | ICD-10-CM | POA: Diagnosis not present

## 2018-06-22 DIAGNOSIS — R0602 Shortness of breath: Secondary | ICD-10-CM | POA: Diagnosis not present

## 2018-06-22 DIAGNOSIS — I4821 Permanent atrial fibrillation: Secondary | ICD-10-CM | POA: Diagnosis not present

## 2018-06-22 DIAGNOSIS — I252 Old myocardial infarction: Secondary | ICD-10-CM | POA: Diagnosis not present

## 2018-06-22 DIAGNOSIS — I10 Essential (primary) hypertension: Secondary | ICD-10-CM | POA: Diagnosis not present

## 2018-06-22 DIAGNOSIS — E7800 Pure hypercholesterolemia, unspecified: Secondary | ICD-10-CM | POA: Diagnosis not present

## 2018-06-27 DIAGNOSIS — I4891 Unspecified atrial fibrillation: Secondary | ICD-10-CM | POA: Diagnosis not present

## 2018-06-27 DIAGNOSIS — I252 Old myocardial infarction: Secondary | ICD-10-CM | POA: Diagnosis not present

## 2018-06-27 DIAGNOSIS — R0602 Shortness of breath: Secondary | ICD-10-CM | POA: Diagnosis not present

## 2018-06-27 DIAGNOSIS — R000 Tachycardia, unspecified: Secondary | ICD-10-CM | POA: Diagnosis not present

## 2018-06-27 DIAGNOSIS — I110 Hypertensive heart disease with heart failure: Secondary | ICD-10-CM | POA: Diagnosis not present

## 2018-06-27 DIAGNOSIS — Z886 Allergy status to analgesic agent status: Secondary | ICD-10-CM | POA: Diagnosis not present

## 2018-06-27 DIAGNOSIS — I509 Heart failure, unspecified: Secondary | ICD-10-CM | POA: Diagnosis not present

## 2018-06-27 DIAGNOSIS — Z79899 Other long term (current) drug therapy: Secondary | ICD-10-CM | POA: Diagnosis not present

## 2018-06-27 DIAGNOSIS — Z7901 Long term (current) use of anticoagulants: Secondary | ICD-10-CM | POA: Diagnosis not present

## 2018-06-27 DIAGNOSIS — Z87891 Personal history of nicotine dependence: Secondary | ICD-10-CM | POA: Diagnosis not present

## 2018-06-27 DIAGNOSIS — J209 Acute bronchitis, unspecified: Secondary | ICD-10-CM | POA: Diagnosis not present

## 2018-06-27 DIAGNOSIS — Z888 Allergy status to other drugs, medicaments and biological substances status: Secondary | ICD-10-CM | POA: Diagnosis not present

## 2018-06-27 DIAGNOSIS — J440 Chronic obstructive pulmonary disease with acute lower respiratory infection: Secondary | ICD-10-CM | POA: Diagnosis not present

## 2018-06-27 DIAGNOSIS — R0689 Other abnormalities of breathing: Secondary | ICD-10-CM | POA: Diagnosis not present

## 2018-06-27 DIAGNOSIS — E7800 Pure hypercholesterolemia, unspecified: Secondary | ICD-10-CM | POA: Diagnosis not present

## 2018-06-27 DIAGNOSIS — I491 Atrial premature depolarization: Secondary | ICD-10-CM | POA: Diagnosis not present

## 2018-06-27 DIAGNOSIS — J441 Chronic obstructive pulmonary disease with (acute) exacerbation: Secondary | ICD-10-CM | POA: Diagnosis not present

## 2018-06-27 DIAGNOSIS — K219 Gastro-esophageal reflux disease without esophagitis: Secondary | ICD-10-CM | POA: Diagnosis not present

## 2018-06-30 ENCOUNTER — Other Ambulatory Visit: Payer: Self-pay | Admitting: *Deleted

## 2018-06-30 NOTE — Patient Outreach (Signed)
[facility]) Care Management  06/30/2018  SCOTTY KRATOVIL 12-30-1965 [id number]   Telephone Screen  Referral Date: 06/29/2018 Referral Source: [facility] referral -[place] [facility]- (medium)  Referral Reason: Member has had 2 ER visit since January 1st due to fluid build up has a dx of CHF. Went over diet and medication management and other interventions to help fluid build up, Member voiced a good understanding and feels that he is doing everything he can to help manage CHF.  Member plans for follow up with pcp this week. Voiced that the Dr is aware and he feels like he does nothing about it.  Member also voiced that his medication is generic medications and feels that when he goes to ER they do not give him the generic and that helps remove the fluid. Please follow up to help further mange CHF dx Insurance: [place] health care medicare and medicaid of [place] [facility] admissions x 3 ED visits x 3 in the last 6 months [facility] admission on 06/27/2018   Outreach attempt # 1 successful at the home number  Patient is able to verify HIPAA Reviewed and addressed referral to [facility] with patient He confirms a call from [facility] staff on 06/29/2018  He reports he was in [facility]  hospital on Monday 06/27/2018  He informs CM he feels he is in poor health  During the call Mr Rindal had to stop to use the restroom related to fluids  Social: Mr Alejandro lives with his mother and other family members He reports he is independent to assist with his care needs He reports difficulty with bathing, standing for long intervals and cooking for himself related to "fluid build up" He reports "I try to do the best I can. That is all I can do." He reports his family assists with transportation to medical appointments He states he is not comfortable with riding the transportation [place] because he has difficulty getting in and out He also reports he does not ride a transportation [place] related to his medicine (having to  void frequently) and not being able tolerate long distances without stopping  Conditions: chronic combined systolic and diastolic heart failure, nonischemic cardiomyopathy, obstructive sleep apnea on CPAP with chronic home oxygen, hypertension, dyslipidemia, atrial fibrillation, GERD, heart attack,  Obesity, noncompliance   Medications:He  denies concerns with affording medications related to having medicaid,  He reports he take the medications as ordered "but it still does not help"  He reports side effects -Few of my medicines" make him "nauseated, and where I can't breathe.  My water pills make my legs swell more." He does not prefer to get flu shots    DME home oxygen at 3.5 L/[place] prn last used O2 on Monday, 06/27/2018  Has nebulizer, glucose meter, He denied having a CPAP/BIPAP during this screen   Appointments: saw Dr Pearson Grippe on 06/21/18    Advance Directives: Denies need for assist with advance directives  Not interested in getting assistance He reports his mother would make decisions for him  Consent: [facility] RN CM reviewed [facility] services with patient. Patient gave verbal consent for services telephonic RN CM, [facility] RN CM and pharmacy .  Plan: [facility] RN CM will refer Mr Frasca to [facility] pharmacy for medication management related to reports side effects from medications and medicines not working  [facility] RN CM will refer Mr Olver to [facility] RN CM for CHF disease management, 2 ER visit since January 1st due to fluid build  up has a dx of CHF [facility] admissions x 3 ED visits x 3 in the last 6 months [facility] admission on 06/27/2018   L. Noelle Penner, RN, BSN, CCM [facility] Telephonic Care Management Care Coordinator Office number [phone] Mobile number [phone]  Main [facility] number [phone] Fax number [phone]

## 2018-07-01 ENCOUNTER — Other Ambulatory Visit: Payer: Self-pay

## 2018-07-01 IMAGING — DX DG CHEST 2V
2 series · 2 of 2 positions shown · non-contrast
Comparison: 10/16/2016 chest radiograph.

CLINICAL DATA: Dyspnea

EXAM:
CHEST  2 VIEW

[chest pa]
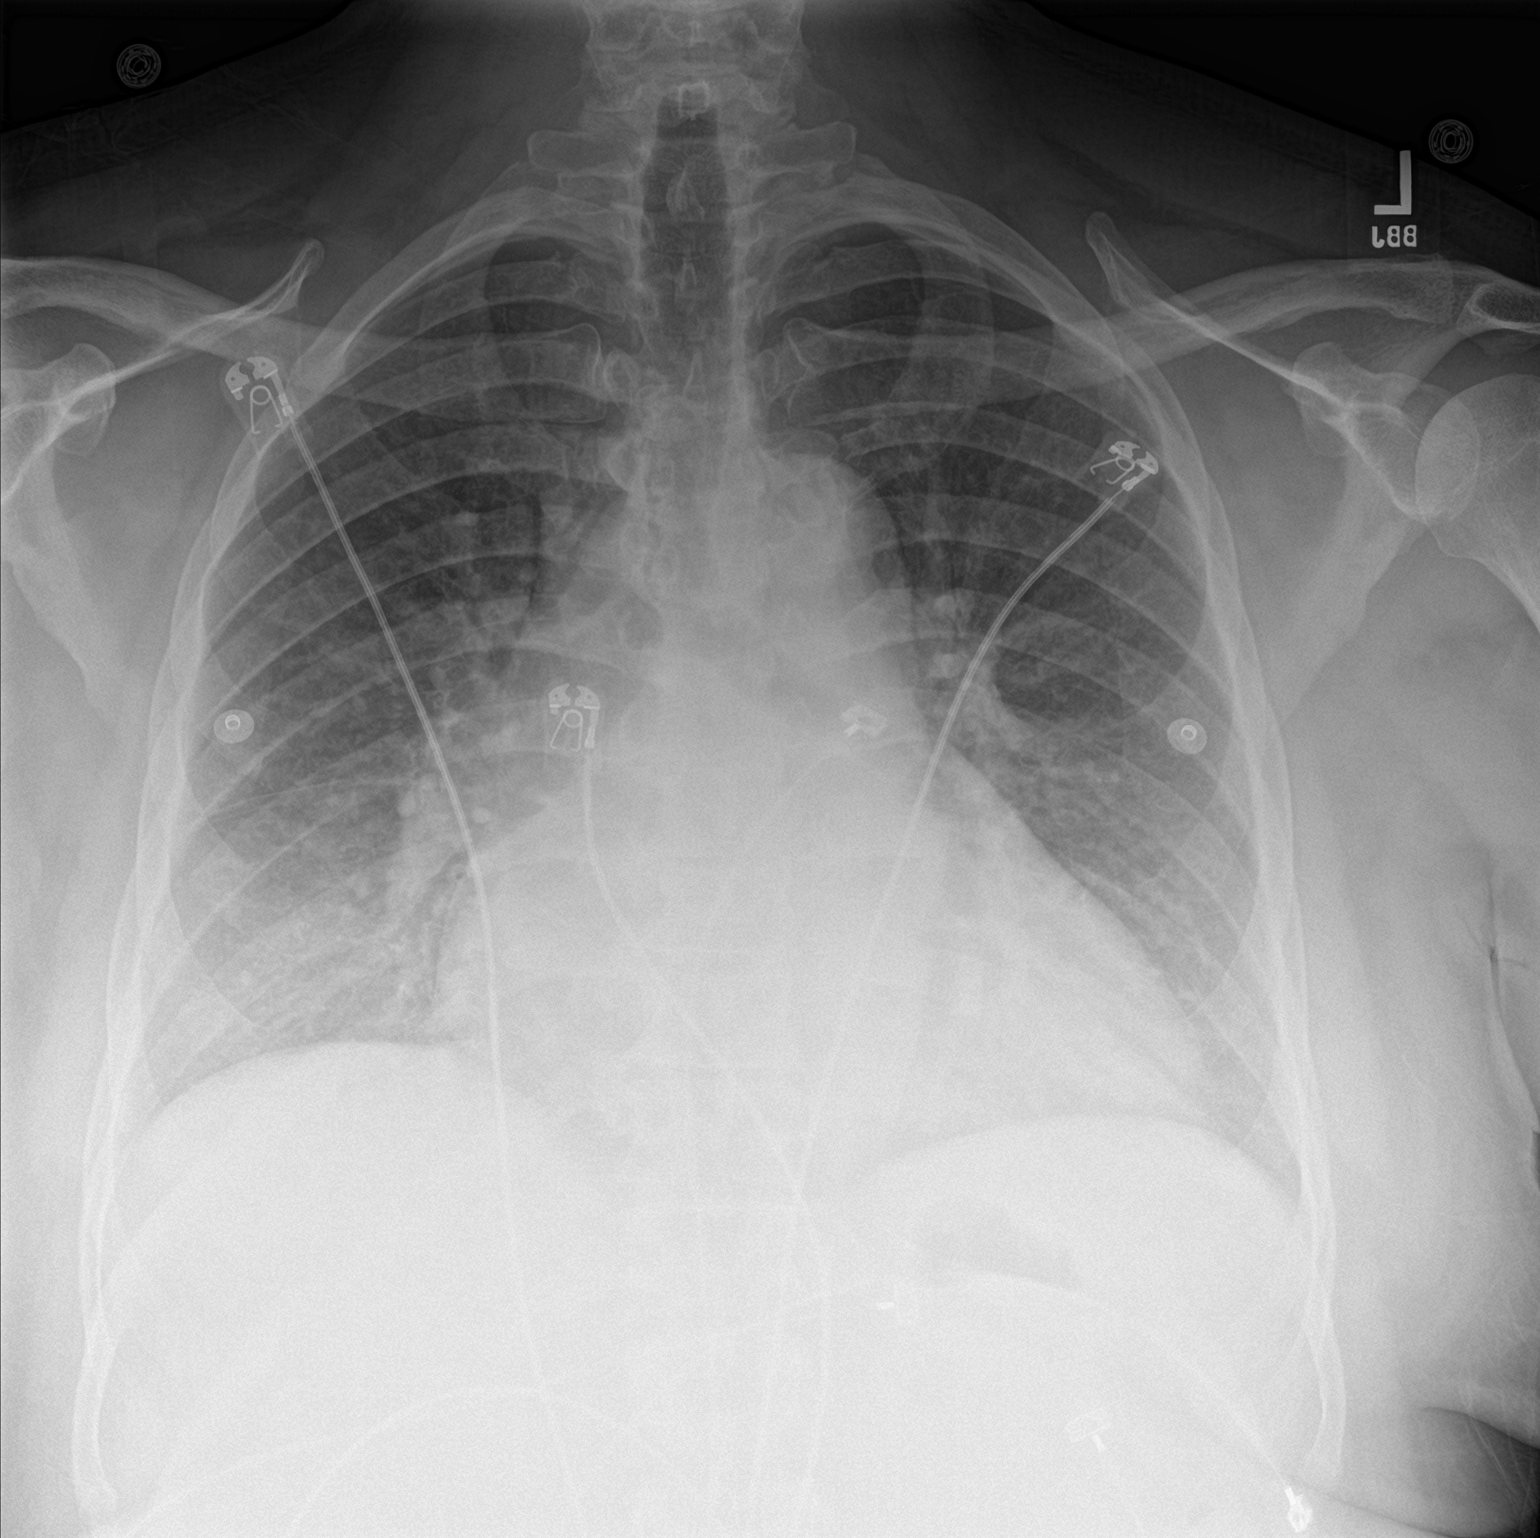

[chest lat]
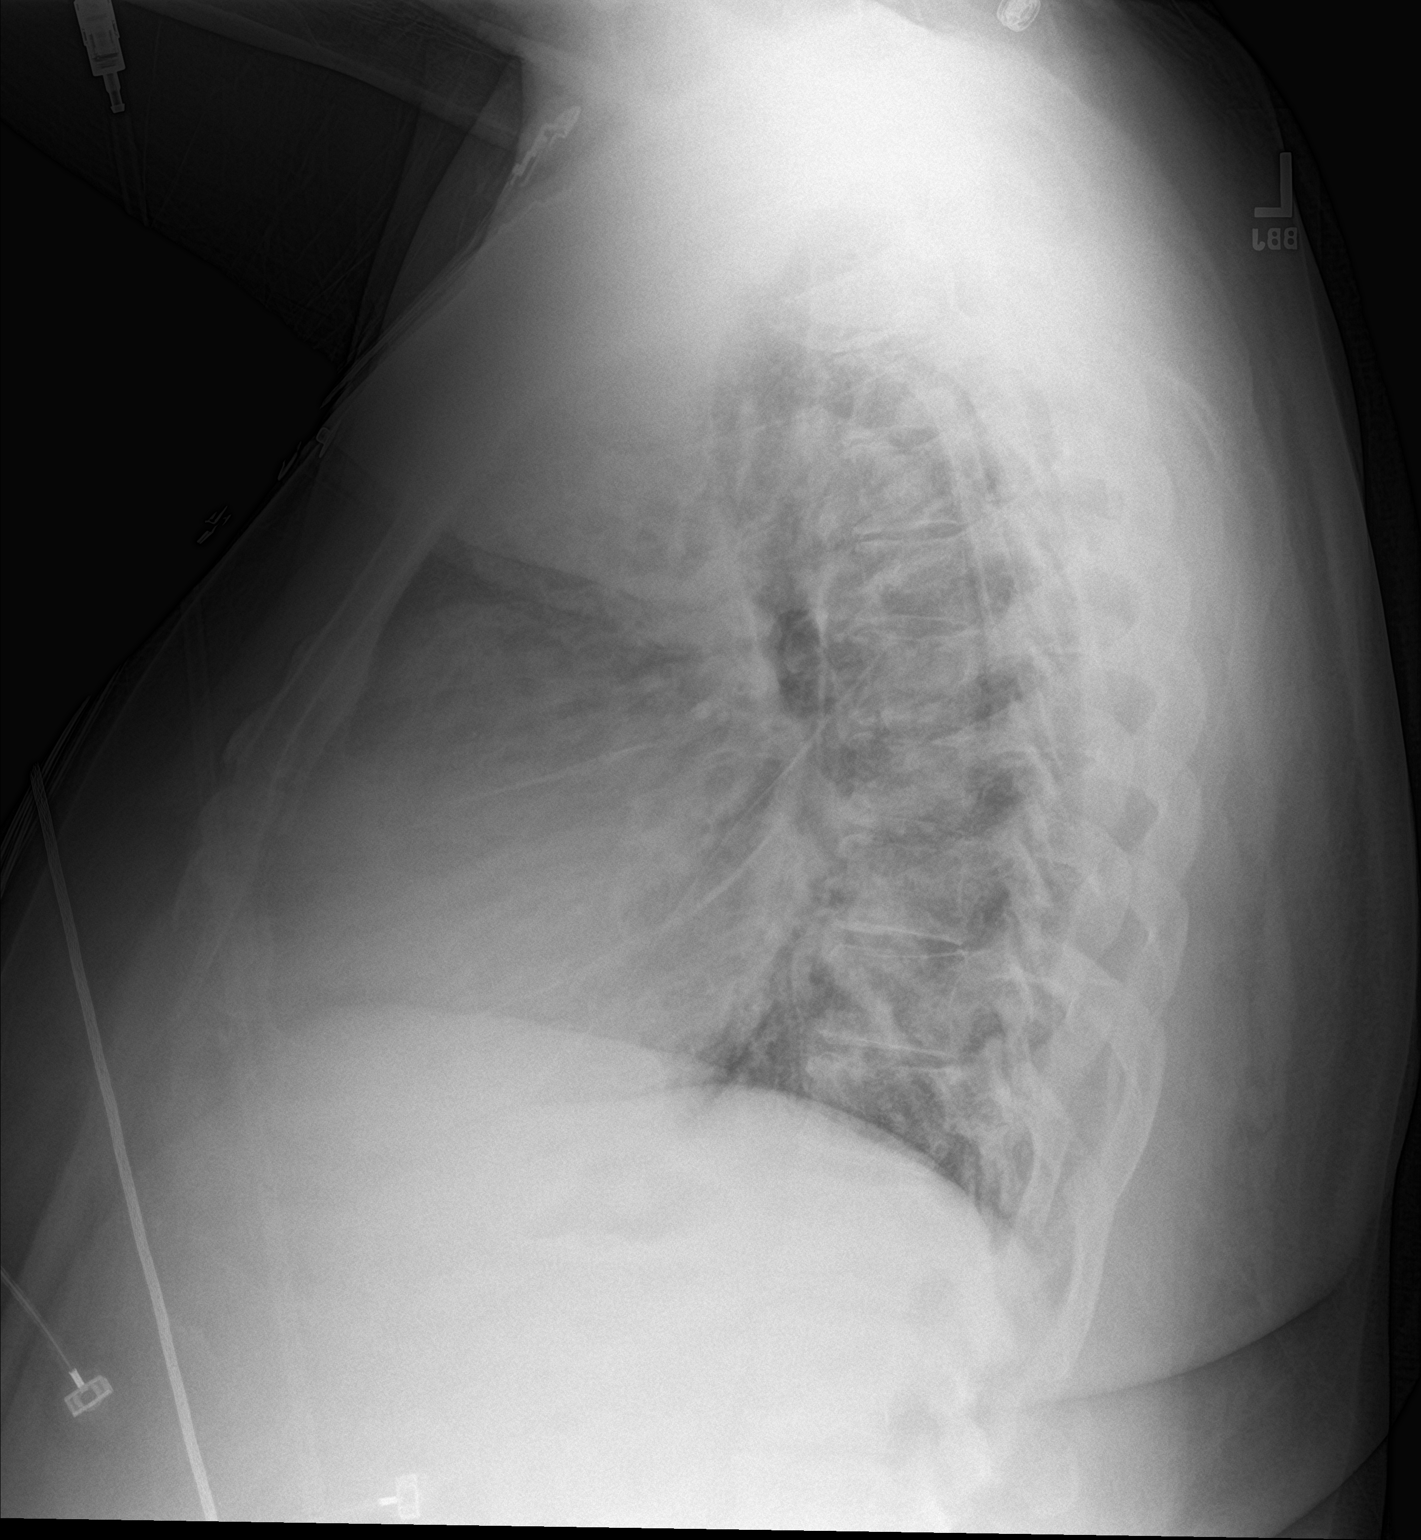

[2 of 2 positions shown; findings below may reference images not displayed]

FINDINGS: Stable cardiomediastinal silhouette with mild cardiomegaly. No
pneumothorax. No pleural effusion. Mild to moderate pulmonary edema.
IMPRESSION: Mild-to-moderate congestive heart failure.

## 2018-07-01 NOTE — Patient Outreach (Signed)
[facility]) Care Management  [facility]   07/01/2018  [name] 01-Dec-1965 [id number]  Reason for referral: Medication Management  Referral source: [facility] RN Current insurance:[employer]  [facility]  PMHx includes but not limited to:  hypertension, atrial fibrillation, pulmonary hypertension, systolic heart failure (04/01/18  LVEF of 55%) asthma, GERD, CKD Stage II, and hyperlipidemia  Outreach:  Successful telephone call with [name].  HIPAA identifiers verified.   Subjective:  [name] reports that his feet are swollen and that the only way to decrease the swelling is to get IV diuretics in the hospital.  He states that furosemide and torsemide oral do not work for him.  He states that he is taking Bumex 2 mg three times daily as prescribed.  He reports the following weights this week, Monday-375 lbs, Tues-376 lbs, Wed- 374 lbs, Thurs-376 lbs and today- 379 lbs.  Patient states that his breathing is his "normal' and he only wears his oxygen prn at bedtime.  He reports that he is only taking 1 (25mg ) tablet of carvedilol twice daily instead of 1.5 (37.5 mg) twice daily.  He states that he sees a NP at the [facility] in [place] along with Dr. Purvis Sheffield, cardiology.  He cannot tell me when he last had an appointment with either.   Objective: Lab Results  Component Value Date   CREATININE 1.23 06/13/2018   CREATININE 1.11 06/12/2018   CREATININE 1.23 06/11/2018    Lab Results  Component Value Date   HGBA1C 5.7 (H) 04/04/2016    Lipid Panel     Component Value Date/Time   CHOL 139 06/14/2015 0328   TRIG 58 06/14/2015 0328   HDL 28 (L) 06/14/2015 0328   CHOLHDL 5.0 06/14/2015 0328   VLDL 12 06/14/2015 0328   LDLCALC 99 06/14/2015 0328    BP Readings from Last 3 Encounters:  06/13/18 130/86  04/03/18 94/77  03/14/18 (!) 109/95    Allergies  Allergen Reactions  . Banana Shortness Of Breath  . Bee Venom Shortness Of Breath,  Swelling and Other (See Comments)    Reaction:  Facial swelling  . Food Shortness Of Breath, Rash and Other (See Comments)    Pt states that he is allergic to strawberries.    . Aspirin Other (See Comments)    Reaction:  GI upset   . Metolazone Other (See Comments)    Pt states that he stopped taking this med due to heart attack like symptoms.   . Oatmeal Nausea And Vomiting  . Orange Juice [Orange Oil] Nausea And Vomiting    All acidic products make him nauseous and upset stomach  . Torsemide Swelling and Other (See Comments)    Reaction:  Swelling of feet/legs   . Diltiazem Palpitations  . Hydralazine Palpitations  . Lipitor [Atorvastatin] Other (See Comments)    Reaction:  Nose bleeds     Medications Reviewed Today    Reviewed by Hurley Cisco, [facility] (Pharmacist) on 07/01/18 at 1306  Med List Status: <None>  Medication Order Taking? Sig Documenting Provider Last Dose Status Informant  albuterol (PROVENTIL) (2.5 MG/3ML) 0.083% nebulizer solution [id number] Yes Take 2.5 mg by nebulization every 6 (six) hours as needed for wheezing or shortness of breath. [provider] Taking Active Self           Med Note Thomasene Lot Mar 04, 2017  8:53 PM)    aspirin 81 MG chewable tablet [id number] Yes Chew  1 tablet (81 mg total) by mouth daily. Erick Blinks, MD Taking Active Self           Med Note (Nihal Marzella, Lynita Lombard   Fri Jul 01, 2018 12:27 PM)    atorvastatin (LIPITOR) 40 MG tablet [id number] Yes Take 1 tablet (40 mg total) by mouth daily at 6 PM. Kathlen Mody, MD Taking Active Self  beclomethasone (QVAR) 80 MCG/ACT inhaler [id number] Yes Inhale 2 puffs into the lungs 2 (two) times daily.  [provider] Taking Active Self           Med Note Allyne Gee, Irven Easterly Mar 04, 2017  8:54 PM)    bumetanide (BUMEX) 2 MG tablet [id number] Yes Take 1 tablet (2 mg total) by mouth 3 (three) times daily. Sherryll Burger, Pratik D, DO Taking Active   carvedilol (COREG) 25 MG tablet  [id number] Yes Take 1.5 tablets (37.5 mg total) by mouth 2 (two) times daily with a meal.  Patient taking differently:  Take 37.5 mg by mouth 2 (two) times daily with a meal. Patient reports he is only taking 1 tablet twice daily- 07/01/17   [name], MD Taking Active Self  docusate sodium (COLACE) 100 MG capsule [id number] Yes Take 1 capsule (100 mg total) by mouth 2 (two) times daily. Kathlen Mody, MD Taking Active Self  ferrous gluconate (FERGON) 324 MG tablet [id number] Yes Take 1 tablet (324 mg total) by mouth 2 (two) times daily with a meal. Kathlen Mody, MD Taking Active Self  FLOVENT HFA 220 MCG/ACT inhaler [id number] Yes Inhale 1-2 puffs into the lungs every 4 (four) hours as needed. [provider] Taking Active Self  lisinopril (PRINIVIL,ZESTRIL) 5 MG tablet [id number] Yes Take 5 mg by mouth daily. [provider] Taking Active   OXYGEN [id number] Yes Inhale 3.5 L into the lungs daily. [provider] Taking Active Self           Med Note Noelle Penner, KIMBERLY L   Fri Sep 17, 2017 11:21 AM)   3-3.5 L prn   pantoprazole (PROTONIX) 40 MG tablet [id number] Yes Take 1 tablet (40 mg total) by mouth daily at 6 (six) AM.  Patient taking differently:  Take 40 mg by mouth every evening.    Kathlen Mody, MD Taking Active Self        Discontinued 09/03/15 [phone] (Entry Error)   potassium chloride SA (K-DUR,KLOR-CON) 20 MEQ tablet [id number] Yes Take 1 tablet (20 mEq total) by mouth 2 (two) times daily. Sherryll Burger, Pratik D, DO Taking Active   rivaroxaban (XARELTO) 20 MG TABS tablet [id number] Yes Take 1 tablet (20 mg total) by mouth daily with breakfast.  Patient taking differently:  Take 20 mg by mouth daily with supper.    Kathlen Mody, MD Taking Active Self           Med Note Noelle Penner, Kathreen Cosier   Fri Sep 17, 2017 11:23 AM) Taking in evening     spironolactone (ALDACTONE) 100 MG tablet [id number] Yes Take 100 mg by mouth daily. For fluid [provider] Taking  Active Self  tamsulosin (FLOMAX) 0.4 MG CAPS capsule [id number] Yes Take 1 capsule (0.4 mg total) by mouth daily. Kathlen Mody, MD Taking Active Self         ASSESSMENT: Date Discharged from Hospital: 06/13/18 Date Medication Reconciliation Performed: 07/01/2018  Adjustments at Discharge: . Bumetanide . Potassium  Medications Discontinued at Discharge: Lisinopril on med list on 12/21 from Dr. [name]. On following H&P note  that same day, it was no longer on the medication list.  Unsure if this was accidental or intentional. Patient reports he is still taking this medication.   Patient was recently discharged from hospital and all medications have been reviewed.  Assessment:  Drugs sorted by system:  Cardiovascular:  Aspirin 81 mg, atorvastatin, carvedilol, lisinopril?, potassium chloride, rivaroxaban, spironolactone  Pulmonary/Allergy: albuterol nebs, beclomethasone, fluticasone inhaler,   Gastrointestinal: docusate sodium, pantoprazole,   Genitourinary: tamsulosin  Vitamins/Minerals/Supplements: ferrous sulfate,  Miscellaneous:  Medication Review Findings:  . Lisinopril- patient reports taking 5 mg daily.  This medication was removed on 12/21 from medication list at some point.  Not sure if intentional or accidental, so it was not on his  discharge AVS and he states he is still taking this medication.  Will clarify with cardiologist.  . Carvedilol- patient reports taking 1 tablet (25mg ) twice dialy instead of 1.5 tablets twice daily.  Call placed to Dr. Junius Argyle office to inform MD of his pedal edema.  Staff reports he did not keep his appointment in October, nor follow up with office after they sent him a letter.  Earliest appointment is 1/15 at 3:20 pm.  Patient verbalized that he will be at this appointment.   Medication Assistance Findings:  No medication assistance needs identified.  Plan: Route note to Dr. Purvis Sheffield.  Clarify if he is to continue Lisinopril.    Follow up with [name] after cardiology visit.   [name], PharmD Clinical Pharmacist [facility] [phone]

## 2018-07-02 ENCOUNTER — Encounter (HOSPITAL_COMMUNITY): Payer: Self-pay

## 2018-07-02 ENCOUNTER — Other Ambulatory Visit: Payer: Self-pay

## 2018-07-02 ENCOUNTER — Inpatient Hospital Stay (HOSPITAL_COMMUNITY)
Admission: EM | Admit: 2018-07-02 | Discharge: 2018-07-06 | DRG: 292 | Disposition: A | Discharge status: Home / Self Care | Payer: Medicare Other | Attending: Family Medicine | Admitting: Family Medicine

## 2018-07-02 ENCOUNTER — Emergency Department (HOSPITAL_COMMUNITY): Payer: Medicare Other

## 2018-07-02 DIAGNOSIS — Z7901 Long term (current) use of anticoagulants: Secondary | ICD-10-CM | POA: Diagnosis not present

## 2018-07-02 DIAGNOSIS — R0789 Other chest pain: Secondary | ICD-10-CM | POA: Diagnosis not present

## 2018-07-02 DIAGNOSIS — Z9119 Patient's noncompliance with other medical treatment and regimen: Secondary | ICD-10-CM | POA: Diagnosis not present

## 2018-07-02 DIAGNOSIS — R0602 Shortness of breath: Secondary | ICD-10-CM

## 2018-07-02 DIAGNOSIS — Z6841 Body Mass Index (BMI) 40.0 and over, adult: Secondary | ICD-10-CM

## 2018-07-02 DIAGNOSIS — Z87891 Personal history of nicotine dependence: Secondary | ICD-10-CM

## 2018-07-02 DIAGNOSIS — Z9981 Dependence on supplemental oxygen: Secondary | ICD-10-CM

## 2018-07-02 DIAGNOSIS — I428 Other cardiomyopathies: Secondary | ICD-10-CM | POA: Diagnosis not present

## 2018-07-02 DIAGNOSIS — Z7982 Long term (current) use of aspirin: Secondary | ICD-10-CM

## 2018-07-02 DIAGNOSIS — Z91018 Allergy to other foods: Secondary | ICD-10-CM | POA: Diagnosis not present

## 2018-07-02 DIAGNOSIS — Z8249 Family history of ischemic heart disease and other diseases of the circulatory system: Secondary | ICD-10-CM | POA: Diagnosis not present

## 2018-07-02 DIAGNOSIS — R079 Chest pain, unspecified: Secondary | ICD-10-CM | POA: Diagnosis not present

## 2018-07-02 DIAGNOSIS — E876 Hypokalemia: Secondary | ICD-10-CM | POA: Diagnosis present

## 2018-07-02 DIAGNOSIS — I509 Heart failure, unspecified: Secondary | ICD-10-CM

## 2018-07-02 DIAGNOSIS — I252 Old myocardial infarction: Secondary | ICD-10-CM | POA: Diagnosis not present

## 2018-07-02 DIAGNOSIS — Z79899 Other long term (current) drug therapy: Secondary | ICD-10-CM

## 2018-07-02 DIAGNOSIS — Z66 Do not resuscitate: Secondary | ICD-10-CM | POA: Diagnosis not present

## 2018-07-02 DIAGNOSIS — N400 Enlarged prostate without lower urinary tract symptoms: Secondary | ICD-10-CM | POA: Diagnosis present

## 2018-07-02 DIAGNOSIS — I4891 Unspecified atrial fibrillation: Secondary | ICD-10-CM | POA: Diagnosis present

## 2018-07-02 DIAGNOSIS — J45909 Unspecified asthma, uncomplicated: Secondary | ICD-10-CM | POA: Diagnosis present

## 2018-07-02 DIAGNOSIS — I110 Hypertensive heart disease with heart failure: Principal | ICD-10-CM | POA: Diagnosis present

## 2018-07-02 DIAGNOSIS — I5043 Acute on chronic combined systolic (congestive) and diastolic (congestive) heart failure: Secondary | ICD-10-CM

## 2018-07-02 DIAGNOSIS — I491 Atrial premature depolarization: Secondary | ICD-10-CM | POA: Diagnosis not present

## 2018-07-02 DIAGNOSIS — I499 Cardiac arrhythmia, unspecified: Secondary | ICD-10-CM | POA: Diagnosis not present

## 2018-07-02 DIAGNOSIS — I5021 Acute systolic (congestive) heart failure: Secondary | ICD-10-CM | POA: Diagnosis present

## 2018-07-02 DIAGNOSIS — I5023 Acute on chronic systolic (congestive) heart failure: Secondary | ICD-10-CM | POA: Diagnosis not present

## 2018-07-02 DIAGNOSIS — I878 Other specified disorders of veins: Secondary | ICD-10-CM | POA: Diagnosis not present

## 2018-07-02 DIAGNOSIS — I10 Essential (primary) hypertension: Secondary | ICD-10-CM | POA: Diagnosis not present

## 2018-07-02 LAB — CBC WITH DIFFERENTIAL/PLATELET
Abs Immature Granulocytes: 0.02 10*3/uL (ref 0.00–0.07)
BASOS PCT: 0 %
Basophils Absolute: 0 10*3/uL (ref 0.0–0.1)
Eosinophils Absolute: 0.1 10*3/uL (ref 0.0–0.5)
Eosinophils Relative: 2 %
HCT: 39.1 % (ref 39.0–52.0)
Hemoglobin: 12.1 g/dL — ABNORMAL LOW (ref 13.0–17.0)
Immature Granulocytes: 0 %
Lymphocytes Relative: 21 %
Lymphs Abs: 1.1 10*3/uL (ref 0.7–4.0)
MCH: 28 pg (ref 26.0–34.0)
MCHC: 30.9 g/dL (ref 30.0–36.0)
MCV: 90.5 fL (ref 80.0–100.0)
MONO ABS: 0.5 10*3/uL (ref 0.1–1.0)
MONOS PCT: 9 %
Neutro Abs: 3.6 10*3/uL (ref 1.7–7.7)
Neutrophils Relative %: 68 %
Platelets: 176 10*3/uL (ref 150–400)
RBC: 4.32 MIL/uL (ref 4.22–5.81)
RDW: 14.3 % (ref 11.5–15.5)
WBC: 5.3 10*3/uL (ref 4.0–10.5)
nRBC: 0 % (ref 0.0–0.2)

## 2018-07-02 LAB — BASIC METABOLIC PANEL
Anion gap: 6 (ref 5–15)
BUN: 12 mg/dL (ref 6–20)
CALCIUM: 8.5 mg/dL — AB (ref 8.9–10.3)
CO2: 27 mmol/L (ref 22–32)
Chloride: 107 mmol/L (ref 98–111)
Creatinine, Ser: 0.9 mg/dL (ref 0.61–1.24)
GFR calc Af Amer: >60 mL/min (ref >60)
Glucose, Bld: 97 mg/dL (ref 70–99)
POTASSIUM: 3.3 mmol/L — AB (ref 3.5–5.1)
Sodium: 140 mmol/L (ref 135–145)

## 2018-07-02 LAB — I-STAT TROPONIN, ED: Troponin i, poc: 0.02 ng/mL (ref 0.00–0.08)

## 2018-07-02 LAB — BRAIN NATRIURETIC PEPTIDE: B Natriuretic Peptide: 190 pg/mL — ABNORMAL HIGH (ref 0.0–100.0)

## 2018-07-02 MED ORDER — FUROSEMIDE 10 MG/ML IJ SOLN
80.0000 mg | Freq: Once | INTRAMUSCULAR | Status: AC
  Administered 2018-07-02: 80 mg via INTRAVENOUS
  Filled 2018-07-02: qty 8

## 2018-07-02 MED ORDER — ACETAMINOPHEN 325 MG PO TABS
650.0000 mg | ORAL_TABLET | Freq: Once | ORAL | Status: AC
  Administered 2018-07-02: 650 mg via ORAL
  Filled 2018-07-02: qty 2

## 2018-07-02 MED ORDER — ALBUTEROL SULFATE (2.5 MG/3ML) 0.083% IN NEBU
5.0000 mg | INHALATION_SOLUTION | Freq: Once | RESPIRATORY_TRACT | Status: AC
  Administered 2018-07-02: 5 mg via RESPIRATORY_TRACT
  Filled 2018-07-02: qty 6

## 2018-07-02 MED ORDER — POTASSIUM CHLORIDE CRYS ER 20 MEQ PO TBCR
20.0000 meq | EXTENDED_RELEASE_TABLET | Freq: Once | ORAL | Status: AC
  Administered 2018-07-02: 20 meq via ORAL
  Filled 2018-07-02: qty 1

## 2018-07-02 NOTE — ED Notes (Signed)
Xray notified to modify Cxray to portable as pt is lethargic.

## 2018-07-02 NOTE — ED Notes (Signed)
[name], RT aware of need for breathing tx

## 2018-07-02 NOTE — ED Triage Notes (Signed)
Pt brought to ED with SOB which started today. Pt recently discharged from [facility]. Pt with history of CHF and COPD. Pt lethargic in triage but arousable to voice and oriented x4. Pt c/o edema in bilateral lower extremities.

## 2018-07-02 NOTE — ED Provider Notes (Signed)
[facility] EMERGENCY DEPARTMENT Provider Note   CSN: [id number] Arrival date & time: 07/02/18  2053     History   Chief Complaint Chief Complaint  Patient presents with  . Shortness of Breath    HPI Tammi KlippelMark T Yaden is a 53 y.o. male with a past medical history significant for asthma, atrial fibrillation, CHF with nonischemic cardiomyopathy, ejection fraction 20 to 25%, MI, obstructive sleep apnea and is on home oxygen PRN presenting with increased shortness of breath which started today.  He [facility] he [facility] for his CHF, is currently taking Bumex 2 mg 3 times daily, but patient states oral medications do not help him diurese and he endorses increased swelling in his legs over the past several days.  He endorses chronic orthopnea stating he can never lie flat without increased shortness of breath.  He believes he may have gained 3 pounds since his discharge home.  He also endorses midsternal tightness/pressure sensation which is been present for approximately 8 hours.   06/14/15 cardiac cath - normal coronary arteries with pulmonary hypertension and elevated LVEDP.   The history is provided by the patient.    Past Medical History:  Diagnosis Date  . Allergic rhinitis   . Asthma   . Atrial fibrillation (HCC)   . CHF (congestive heart failure) (HCC)   . Essential hypertension   . Gastroesophageal reflux disease   . Heart attack (HCC)   . History of cardiac catheterization    Normal coronaries December 2016  . Noncompliance    Major problem leading to declining health and recurrent hospitalization  . Nonischemic cardiomyopathy (HCC)    LVEF 20-25%  . On home O2    3L N/C  . OSA (obstructive sleep apnea)   . Osteoarthritis   . Peptic ulcer disease     Patient Active Problem List   Diagnosis Date Noted  . Acute on chronic combined systolic and diastolic heart failure (HCC) 04/01/2018  . Hypomagnesemia 04/01/2018  . Acute on  chronic diastolic CHF (congestive heart failure) (HCC) 03/11/2018  . Pressure injury of skin 03/11/2018  . CHF (congestive heart failure) (HCC) 09/08/2017  . Altered mental status 05/11/2017  . Acute exacerbation of CHF (congestive heart failure) (HCC) 04/20/2017  . Acute on chronic systolic (congestive) heart failure (HCC) 04/19/2017  . CHF exacerbation (HCC) 04/11/2017  . Chronic respiratory failure with hypoxia (HCC) 04/11/2017  . Encounter for hospice care discussion   . Palliative care encounter   . Goals of care, counseling/discussion   . DNR (do not resuscitate)   . DNR (do not resuscitate) discussion   . Chronic congestive heart failure (HCC) 02/18/2017  . Acute systolic heart failure (HCC) 01/18/2017  . Pedal edema 12/02/2016  . Acute CHF (congestive heart failure) (HCC) 11/09/2016  . Acute on chronic systolic CHF (congestive heart failure) (HCC) 08/19/2016  . CKD (chronic kidney disease), stage II 05/17/2016  . Coronary arteries, normal Dec 2016 01/23/2016  . Chronic anticoagulation-Xarelto 01/23/2016  . NSVT (nonsustained ventricular tachycardia) (HCC) 01/23/2016  . Elevated troponin 12/21/2015  . Nonischemic cardiomyopathy (HCC)   . Morbid obesity due to excess calories (HCC)   . Noncompliance with diet and medication regimen 12/02/2015  . OSA (obstructive sleep apnea) 09/20/2015  . Pulmonary hypertension (HCC) 09/19/2015  . Normocytic anemia 09/19/2015  . Atrial fibrillation, chronic   . Dyspnea on exertion 05/28/2015  . Acute respiratory failure with hypoxia (HCC) 04/01/2015  . Hypokalemia 04/01/2015  . Obesity hypoventilation  syndrome (HCC) 08/08/2014  . GERD (gastroesophageal reflux disease) 08/08/2014  . Edema of right lower extremity 06/21/2014  . Fecal urgency 06/21/2014  . Asthma 02/11/2009  . Hyperlipidemia 07/12/2008  . OBESITY, MORBID 07/12/2008  . Anxiety state 07/12/2008  . DEPRESSION 07/12/2008  . Essential hypertension 07/12/2008  . ALLERGIC  RHINITIS 07/12/2008  . Peptic ulcer 07/12/2008  . OSTEOARTHRITIS 07/12/2008    Past Surgical History:  Procedure Laterality Date  . CARDIAC CATHETERIZATION N/A 06/14/2015   Procedure: Right/Left Heart Cath and Coronary Angiography;  Surgeon: Runell Gess, MD;  Location: [facility] INVASIVE CV LAB;  Service: Cardiovascular;  Laterality: N/A;        Home Medications    Prior to Admission medications   Medication Sig Start Date End Date Taking? Authorizing Provider  albuterol (PROVENTIL) (2.5 MG/3ML) 0.083% nebulizer solution Take 2.5 mg by nebulization every 6 (six) hours as needed for wheezing or shortness of breath.   Yes [provider]  aspirin 81 MG chewable tablet Chew 1 tablet (81 mg total) by mouth daily. 04/13/17  Yes Erick Blinks, MD  atorvastatin (LIPITOR) 40 MG tablet Take 1 tablet (40 mg total) by mouth daily at 6 PM. 06/25/16  Yes Kathlen Mody, MD  beclomethasone (QVAR) 80 MCG/ACT inhaler Inhale 2 puffs into the lungs 2 (two) times daily.    Yes [provider]  bumetanide (BUMEX) 2 MG tablet Take 1 tablet (2 mg total) by mouth 3 (three) times daily. 06/13/18 07/13/18 Yes Shah, Pratik D, DO  carvedilol (COREG) 25 MG tablet Take 1.5 tablets (37.5 mg total) by mouth 2 (two) times daily with a meal. Patient taking differently: Take 37.5 mg by mouth 2 (two) times daily with a meal. Patient reports he is only taking 1 tablet twice daily- 07/01/17 10/07/17  Yes [name], MD  docusate sodium (COLACE) 100 MG capsule Take 1 capsule (100 mg total) by mouth 2 (two) times daily. 06/25/16  Yes Kathlen Mody, MD  ferrous gluconate (FERGON) 324 MG tablet Take 1 tablet (324 mg total) by mouth 2 (two) times daily with a meal. 06/25/16  Yes Kathlen Mody, MD  lisinopril (PRINIVIL,ZESTRIL) 5 MG tablet Take 5 mg by mouth daily.   Yes [provider]  OXYGEN Inhale 3.5 L into the lungs daily.   Yes [provider]  pantoprazole (PROTONIX) 40 MG tablet Take 1  tablet (40 mg total) by mouth daily at 6 (six) AM. Patient taking differently: Take 40 mg by mouth every evening.  06/26/16  Yes Kathlen Mody, MD  potassium chloride SA (K-DUR,KLOR-CON) 20 MEQ tablet Take 1 tablet (20 mEq total) by mouth 2 (two) times daily. 06/13/18  Yes Sherryll Burger, Pratik D, DO  rivaroxaban (XARELTO) 20 MG TABS tablet Take 1 tablet (20 mg total) by mouth daily with breakfast. Patient taking differently: Take 20 mg by mouth daily with supper.  06/26/16  Yes Kathlen Mody, MD  spironolactone (ALDACTONE) 100 MG tablet Take 100 mg by mouth daily. For fluid 03/18/17  Yes [provider]  tamsulosin (FLOMAX) 0.4 MG CAPS capsule Take 1 capsule (0.4 mg total) by mouth daily. 06/25/16  Yes Kathlen Mody, MD  potassium chloride 20 MEQ TBCR Take 20 mEq by mouth 2 (two) times daily. 09/03/15 09/03/15  Ward, Layla Maw, DO    Family History Family History  Problem Relation Age of Onset  . Stroke Father   . Heart attack Father   . Aneurysm Mother        Cerebral aneurysm  .  Hypertension Sister   . Colon cancer Neg Hx   . Inflammatory bowel disease Neg Hx   . Liver disease Neg Hx     Social History Social History   Tobacco Use  . Smoking status: Former Smoker    Packs/day: 0.50    Years: 20.00    Pack years: 10.00    Types: Cigarettes    Start date: 04/26/1988    Last attempt to quit: 06/23/2007    Years since quitting: 11.0  . Smokeless tobacco: Never Used  . Tobacco comment: 1 ppd former smoker  Substance Use Topics  . Alcohol use: No    Alcohol/week: 0.0 standard drinks    Comment: No etoh since 2009  . Drug use: No     Allergies   Banana; Bee venom; Food; Aspirin; Metolazone; Oatmeal; Orange juice [orange oil]; Torsemide; Diltiazem; Hydralazine; and Lipitor [atorvastatin]   Review of Systems Review of Systems  Constitutional: Negative for chills and fever.  HENT: Negative for congestion and sore throat.   Eyes: Negative.   Respiratory: Positive for chest tightness  and shortness of breath.   Cardiovascular: Positive for chest pain and leg swelling.  Gastrointestinal: Negative for abdominal pain and nausea.  Genitourinary: Negative.   Musculoskeletal: Negative for arthralgias, joint swelling and neck pain.  Skin: Negative.  Negative for rash and wound.  Neurological: Negative for dizziness, weakness, light-headedness, numbness and headaches.  Psychiatric/Behavioral: Negative.      Physical Exam Updated Vital Signs BP (!) 163/91   Pulse (!) 104   Temp 98.7 F (37.1 C) (Oral)   Resp 20   Ht 6' (1.829 m)   Wt (!) 169.6 kg   SpO2 98%   BMI 50.72 kg/m   Physical Exam Vitals signs and nursing note reviewed.  Constitutional:      Appearance: He is well-developed. He is obese.     Comments: Somnolent at initial presentation, improved after neb tx.  HENT:     Head: Normocephalic and atraumatic.  Eyes:     Conjunctiva/sclera: Conjunctivae normal.  Neck:     Musculoskeletal: Normal range of motion.  Cardiovascular:     Rate and Rhythm: Normal rate and regular rhythm.     Heart sounds: Normal heart sounds.  Pulmonary:     Effort: Pulmonary effort is normal.     Breath sounds: Decreased breath sounds present. No wheezing or rhonchi.     Comments: No wheezing, however examined after neb treatment. Chest:     Chest wall: No mass or deformity.  Abdominal:     General: Bowel sounds are normal.     Palpations: Abdomen is soft.     Tenderness: There is no abdominal tenderness.  Musculoskeletal: Normal range of motion.     Right lower leg: Edema present.     Left lower leg: Edema present.  Skin:    General: Skin is warm and dry.      ED Treatments / Results  Labs (all labs ordered are listed, but only abnormal results are displayed) Labs Reviewed  BASIC METABOLIC PANEL - Abnormal; Notable for the following components:      Result Value   Potassium 3.3 (*)    Calcium 8.5 (*)    All other components within normal limits  BRAIN  NATRIURETIC PEPTIDE - Abnormal; Notable for the following components:   B Natriuretic Peptide 190.0 (*)    All other components within normal limits  CBC WITH DIFFERENTIAL/PLATELET - Abnormal; Notable for the following components:  Hemoglobin 12.1 (*)    All other components within normal limits  I-STAT TROPONIN, ED    EKG EKG Interpretation  Date/Time:  Saturday July 02 2018 21:02:37 EST Ventricular Rate:  98 PR Interval:    QRS Duration: 96 QT Interval:  372 QTC Calculation: 475 R Axis:   67 Text Interpretation:  Atrial fibrillation Ventricular bigeminy Borderline repolarization abnormality Baseline wander When compared with ECG of 06/11/2018 No significant change was found Confirmed by Samuel Jester [phone]) on 07/02/2018 9:55:13 PM   Radiology Dg Chest Port 1 View  Result Date: 07/02/2018 CLINICAL DATA:  Shortness of breath starting today. Lethargic but arousable. Lower extremity edema. EXAM: PORTABLE CHEST 1 VIEW COMPARISON:  06/27/2018 FINDINGS: Cardiac enlargement. Pulmonary vascular congestion with perihilar infiltration, likely edema. No blunting of costophrenic angles. No pneumothorax. Mediastinal contours appear intact. IMPRESSION: Cardiac enlargement with pulmonary vascular congestion and perihilar edema. Electronically Signed   By: Burman Nieves M.D.   On: 07/02/2018 21:41    Procedures Procedures (including critical care time)  Medications Ordered in ED Medications  albuterol (PROVENTIL) (2.5 MG/3ML) 0.083% nebulizer solution 5 mg (5 mg Nebulization Given 07/02/18 2112)  furosemide (LASIX) injection 80 mg (80 mg Intravenous Given 07/02/18 2234)  acetaminophen (TYLENOL) tablet 650 mg (650 mg Oral Given 07/02/18 2233)  potassium chloride SA (K-DUR,KLOR-CON) CR tablet 20 mEq (20 mEq Oral Given 07/02/18 2328)     Initial Impression / Assessment and Plan / ED Course  I have reviewed the triage vital signs and the nursing notes.  Pertinent labs & imaging  results that were available during my care of the patient were reviewed by me and considered in my medical decision making (see chart for details).     Pt with acute on chronic CHF with increased shortness of breath, elevation in his BNP and also with suggestion of CHF on his chest x-ray this evening.  He was given Lasix 80 mg IV and has started to diurese.  He does not feel comfortable going home with his current fluid load, but does endorse he is starting to feel better.  I have discussed patient with Dr. Sharl Ma who will evaluate the patient in the ED.  Final Clinical Impressions(s) / ED Diagnoses   Final diagnoses:  Acute on chronic systolic congestive heart failure [facility])    ED Discharge Orders    None       Victoriano Lain 07/03/18 0156    Samuel Jester, DO 07/07/18 1105

## 2018-07-02 NOTE — H&P (Addendum)
TRH H&P    Patient Demographics:    [name], is a 53 y.o. male  MRN: [id number]  DOB - 1966/01/12  Admit Date - 07/02/2018  Referring MD/NP/PA: Dr. Clarene Duke  Outpatient Primary MD for the patient is Pearson Grippe, MD  Patient coming from: Home  Chief complaint-shortness of breath   HPI:    [name]  is a 53 y.o. male, with history of asthma, atrial fibrillation on anticoagulation with Xarelto, CHF with nonischemic cardiomyopathy, EF 55% as per echo from October 2019, obstructive sleep apnea on home O2 as needed came with worsening shortness of breath and orthopnea.  Patient was discharged from [facility] where he was treated for CHF and is currently taking Bumex 2 mg 3 times a day.  Patient says that he has noticed increased fluid in his legs and also worsening shortness of breath.  Patient is not requiring oxygen in the ED.  O2 sats 95% on room air. He denies chest pain He denies nausea vomiting or diarrhea. He denies dysuria. He denies syncope  In the ED patient was given 80 mg of Lasix IV x1.  BNP 190    Review of systems:    In addition to the HPI above,    All other systems reviewed and are negative.    Past History of the following :    Past Medical History:  Diagnosis Date  . Allergic rhinitis   . Asthma   . Atrial fibrillation (HCC)   . CHF (congestive heart failure) (HCC)   . Essential hypertension   . Gastroesophageal reflux disease   . Heart attack (HCC)   . History of cardiac catheterization    Normal coronaries December 2016  . Noncompliance    Major problem leading to declining health and recurrent hospitalization  . Nonischemic cardiomyopathy (HCC)    LVEF 20-25%  . On home O2    3L N/C  . OSA (obstructive sleep apnea)   . Osteoarthritis   . Peptic ulcer disease       Past Surgical History:  Procedure Laterality Date  . CARDIAC CATHETERIZATION N/A  06/14/2015   Procedure: Right/Left Heart Cath and Coronary Angiography;  Surgeon: Runell Gess, MD;  Location: [facility] INVASIVE CV LAB;  Service: Cardiovascular;  Laterality: N/A;      Social History:      Social History   Tobacco Use  . Smoking status: Former Smoker    Packs/day: 0.50    Years: 20.00    Pack years: 10.00    Types: Cigarettes    Start date: 04/26/1988    Last attempt to quit: 06/23/2007    Years since quitting: 11.0  . Smokeless tobacco: Never Used  . Tobacco comment: 1 ppd former smoker  Substance Use Topics  . Alcohol use: No    Alcohol/week: 0.0 standard drinks    Comment: No etoh since 2009       Family History :     Family History  Problem Relation Age of Onset  . Stroke Father   . Heart  attack Father   . Aneurysm Mother        Cerebral aneurysm  . Hypertension Sister   . Colon cancer Neg Hx   . Inflammatory bowel disease Neg Hx   . Liver disease Neg Hx       Home Medications:   Prior to Admission medications   Medication Sig Start Date End Date Taking? Authorizing Provider  albuterol (PROVENTIL) (2.5 MG/3ML) 0.083% nebulizer solution Take 2.5 mg by nebulization every 6 (six) hours as needed for wheezing or shortness of breath.   Yes [provider]  aspirin 81 MG chewable tablet Chew 1 tablet (81 mg total) by mouth daily. 04/13/17  Yes Erick Blinks, MD  atorvastatin (LIPITOR) 40 MG tablet Take 1 tablet (40 mg total) by mouth daily at 6 PM. 06/25/16  Yes Kathlen Mody, MD  beclomethasone (QVAR) 80 MCG/ACT inhaler Inhale 2 puffs into the lungs 2 (two) times daily.    Yes [provider]  bumetanide (BUMEX) 2 MG tablet Take 1 tablet (2 mg total) by mouth 3 (three) times daily. 06/13/18 07/13/18 Yes Shah, Pratik D, DO  carvedilol (COREG) 25 MG tablet Take 1.5 tablets (37.5 mg total) by mouth 2 (two) times daily with a meal. Patient taking differently: Take 37.5 mg by mouth 2 (two) times daily with a meal. Patient reports he is  only taking 1 tablet twice daily- 07/01/17 10/07/17  Yes [name], MD  docusate sodium (COLACE) 100 MG capsule Take 1 capsule (100 mg total) by mouth 2 (two) times daily. 06/25/16  Yes Kathlen Mody, MD  ferrous gluconate (FERGON) 324 MG tablet Take 1 tablet (324 mg total) by mouth 2 (two) times daily with a meal. 06/25/16  Yes Kathlen Mody, MD  lisinopril (PRINIVIL,ZESTRIL) 5 MG tablet Take 5 mg by mouth daily.   Yes [provider]  OXYGEN Inhale 3.5 L into the lungs daily.   Yes [provider]  pantoprazole (PROTONIX) 40 MG tablet Take 1 tablet (40 mg total) by mouth daily at 6 (six) AM. Patient taking differently: Take 40 mg by mouth every evening.  06/26/16  Yes Kathlen Mody, MD  potassium chloride SA (K-DUR,KLOR-CON) 20 MEQ tablet Take 1 tablet (20 mEq total) by mouth 2 (two) times daily. 06/13/18  Yes Sherryll Burger, Pratik D, DO  rivaroxaban (XARELTO) 20 MG TABS tablet Take 1 tablet (20 mg total) by mouth daily with breakfast. Patient taking differently: Take 20 mg by mouth daily with supper.  06/26/16  Yes Kathlen Mody, MD  spironolactone (ALDACTONE) 100 MG tablet Take 100 mg by mouth daily. For fluid 03/18/17  Yes [provider]  tamsulosin (FLOMAX) 0.4 MG CAPS capsule Take 1 capsule (0.4 mg total) by mouth daily. 06/25/16  Yes Kathlen Mody, MD  potassium chloride 20 MEQ TBCR Take 20 mEq by mouth 2 (two) times daily. 09/03/15 09/03/15  Ward, Layla Maw, DO     Allergies:     Allergies  Allergen Reactions  . Banana Shortness Of Breath  . Bee Venom Shortness Of Breath, Swelling and Other (See Comments)    Reaction:  Facial swelling  . Food Shortness Of Breath, Rash and Other (See Comments)    Pt states that he is allergic to strawberries.    . Aspirin Other (See Comments)    Reaction:  GI upset   . Metolazone Other (See Comments)    Pt states that he stopped taking this med due to heart attack like symptoms.   Corrie Mckusick Nausea And  Vomiting  . Orange Juice  [Orange Oil] Nausea And Vomiting    All acidic products make him nauseous and upset stomach  . Torsemide Swelling and Other (See Comments)    Reaction:  Swelling of feet/legs   . Diltiazem Palpitations  . Hydralazine Palpitations  . Lipitor [Atorvastatin] Other (See Comments)    Reaction:  Nose bleeds      Physical Exam:   Vitals  Blood pressure (!) 163/91, pulse (!) 104, temperature 98.7 F (37.1 C), temperature source Oral, resp. rate 20, height 6' (1.829 m), weight (!) 169.6 kg, SpO2 98 %.  1.  General: Appears in no acute distress  2. Psychiatric:  Intact judgement and  insight, awake alert, oriented x 3.  3. Neurologic: No focal neurological deficits, all cranial nerves intact.Strength 5/5 all 4 extremities, sensation intact all 4 extremities, plantars down going.  4. Eyes :  anicteric sclerae, moist conjunctivae with no lid lag. PERRLA.  5. ENMT:  Oropharynx clear with moist mucous membranes and good dentition  6. Neck:  supple, no cervical lymphadenopathy appriciated, No thyromegaly  7. Respiratory : Normal respiratory effort, decreased breath sounds at lung bases.  No wheezing or crackles auscultated.  8. Cardiovascular : S1-S2, RRR, no murmurs rubs or gallops.  Chronic lymphedema of the lower extremities  9. Gastrointestinal:  Positive bowel sounds, abdomen soft, non-tender to palpation,no hepatosplenomegaly, no rigidity or guarding       10. Skin:  No cyanosis, normal texture and turgor, no rash, lesions or ulcers  11.Musculoskeletal:  Good muscle tone,  joints appear normal , no effusions,  normal range of motion    Data Review:    CBC Recent Labs  Lab 07/02/18 2131  WBC 5.3  HGB 12.1*  HCT 39.1  PLT 176  MCV 90.5  MCH 28.0  MCHC 30.9  RDW 14.3  LYMPHSABS 1.1  MONOABS 0.5  EOSABS 0.1  BASOSABS 0.0   ------------------------------------------------------------------------------------------------------------------  Results for orders  placed or performed during the hospital encounter of 07/02/18 (from the past 48 hour(s))  Basic metabolic panel     Status: Abnormal   Collection Time: 07/02/18  9:31 PM  Result Value Ref Range   Sodium 140 135 - 145 mmol/L   Potassium 3.3 (L) 3.5 - 5.1 mmol/L   Chloride 107 98 - 111 mmol/L   CO2 27 22 - 32 mmol/L   Glucose, Bld 97 70 - 99 mg/dL   BUN 12 6 - 20 mg/dL   Creatinine, Ser 5.280.90 0.61 - 1.24 mg/dL   Calcium 8.5 (L) 8.9 - 10.3 mg/dL   GFR calc non Af Amer >60 >60 mL/min   GFR calc Af Amer >60 >60 mL/min   Anion gap 6 5 - 15    Comment: Performed at [facility], [address] [place], [place] [address]  CBC with Differential/Platelet     Status: Abnormal   Collection Time: 07/02/18  9:31 PM  Result Value Ref Range   WBC 5.3 4.0 - 10.5 K/uL   RBC 4.32 4.22 - 5.81 MIL/uL   Hemoglobin 12.1 (L) 13.0 - 17.0 g/dL   HCT 40.139.1 02.739.0 - 25.352.0 %   MCV 90.5 80.0 - 100.0 fL   MCH 28.0 26.0 - 34.0 pg   MCHC 30.9 30.0 - 36.0 g/dL   RDW 66.414.3 40.311.5 - 47.415.5 %   Platelets 176 150 - 400 K/uL   nRBC 0.0 0.0 - 0.2 %   Neutrophils Relative % 68 %   Neutro Abs 3.6  1.7 - 7.7 K/uL   Lymphocytes Relative 21 %   Lymphs Abs 1.1 0.7 - 4.0 K/uL   Monocytes Relative 9 %   Monocytes Absolute 0.5 0.1 - 1.0 K/uL   Eosinophils Relative 2 %   Eosinophils Absolute 0.1 0.0 - 0.5 K/uL   Basophils Relative 0 %   Basophils Absolute 0.0 0.0 - 0.1 K/uL   Immature Granulocytes 0 %   Abs Immature Granulocytes 0.02 0.00 - 0.07 K/uL    Comment: Performed at [facility], [address], [place], [place] [address]  Brain natriuretic peptide (order ONLY if patient c/o SOB)     Status: Abnormal   Collection Time: 07/02/18  9:32 PM  Result Value Ref Range   B Natriuretic Peptide 190.0 (H) 0.0 - 100.0 pg/mL    Comment: Performed at [facility], [address] [place], [place] [address]  I-stat troponin, ED     Status: None   Collection Time: 07/02/18  9:41 PM  Result Value Ref Range   Troponin i, poc 0.02 0.00 -  0.08 ng/mL   Comment 3            Comment: Due to the release kinetics of cTnI, a negative result within the first hours of the onset of symptoms does not rule out myocardial infarction with certainty. If myocardial infarction is still suspected, repeat the test at appropriate intervals.     Chemistries  Recent Labs  Lab 07/02/18 2131  NA 140  K 3.3*  CL 107  CO2 27  GLUCOSE 97  BUN 12  CREATININE 0.90  CALCIUM 8.5*   ------------------------------------------------------------------------------------------------------------------  ------------------------------------------------------------------------------------------------------------------ GFR: Estimated Creatinine Clearance: 158.9 mL/min (by C-G formula based on SCr of 0.9 mg/dL). Liver Function Tests: No results for input(s): AST, ALT, ALKPHOS, BILITOT, PROT, ALBUMIN in the last 168 hours. No results for input(s): LIPASE, AMYLASE in the last 168 hours. No results for input(s): AMMONIA in the last 168 hours. Coagulation Profile: No results for input(s): INR, PROTIME in the last 168 hours. Cardiac Enzymes: No results for input(s): CKTOTAL, CKMB, CKMBINDEX, TROPONINI in the last 168 hours. BNP (last 3 results) No results for input(s): PROBNP in the last 8760 hours. HbA1C: No results for input(s): HGBA1C in the last 72 hours. CBG: No results for input(s): GLUCAP in the last 168 hours. Lipid Profile: No results for input(s): CHOL, HDL, LDLCALC, TRIG, CHOLHDL, LDLDIRECT in the last 72 hours. Thyroid Function Tests: No results for input(s): TSH, T4TOTAL, FREET4, T3FREE, THYROIDAB in the last 72 hours. Anemia Panel: No results for input(s): VITAMINB12, FOLATE, FERRITIN, TIBC, IRON, RETICCTPCT in the last 72 hours.  --------------------------------------------------------------------------------------------------------------- Urine analysis:    Component Value Date/Time   COLORURINE YELLOW 04/01/2018 0332    APPEARANCEUR CLEAR 04/01/2018 0332   LABSPEC 1.008 04/01/2018 0332   PHURINE 7.0 04/01/2018 0332   GLUCOSEU NEGATIVE 04/01/2018 0332   GLUCOSEU NEG mg/dL 00/76/2263 3354   HGBUR NEGATIVE 04/01/2018 0332   BILIRUBINUR NEGATIVE 04/01/2018 0332   KETONESUR NEGATIVE 04/01/2018 0332   PROTEINUR NEGATIVE 04/01/2018 0332   UROBILINOGEN 0.2 08/08/2014 1445   NITRITE NEGATIVE 04/01/2018 0332   LEUKOCYTESUR NEGATIVE 04/01/2018 0332      Imaging Results:    Dg Chest Port 1 View  Result Date: 07/02/2018 CLINICAL DATA:  Shortness of breath starting today. Lethargic but arousable. Lower extremity edema. EXAM: PORTABLE CHEST 1 VIEW COMPARISON:  06/27/2018 FINDINGS: Cardiac enlargement. Pulmonary vascular congestion with perihilar infiltration, likely edema. No blunting of costophrenic angles. No pneumothorax. Mediastinal contours  appear intact. IMPRESSION: Cardiac enlargement with pulmonary vascular congestion and perihilar edema. Electronically Signed   By: Burman NievesWilliam  Stevens M.D.   On: 07/02/2018 21:41    My personal review of EKG: Rhythm atrial fibrillation, heart rate controlled   Assessment & Plan:    Active Problems:   Acute exacerbation of CHF (congestive heart failure) (HCC)   1. Acute exacerbation of combined systolic and diastolic CHF-patient has had multiple admissions and gets better with IV diuresis.  Will start with Lasix 80 mg IV every 12 hours.  Check BMP in a.m.  Daily weights.  Will not repeat echocardiogram as his last echo was in October 2019.  2. Hypokalemia-potassium is 3.3, replaced in the ED.  Will check BMP in a.m.  3. Hypertension-continue home medication including Coreg, Aldactone, lisinopril.  4. Atrial fibrillation-heart rate is controlled continue carvedilol for rate control as well as Xarelto for anticoagulation.  5. BPH-continue tamsulosin.  6. History of asthma-continue Qvar    DVT Prophylaxis-   Xarelto  AM Labs Ordered, also please review Full  Orders  Family Communication: Admission, patients condition and plan of care including tests being ordered have been discussed with the patient  who indicate understanding and agree with the plan and Code Status.  Code Status: DNR  Admission status: Observation: Based on patients clinical presentation and evaluation of above clinical data, I have made determination that patient will need less than 2 midnights stay in the hospital.  Time spent in minutes : 60 minutes   Meredeth IdeGagan S Kathlynn Swofford M.D on 07/02/2018 at 11:57 PM  Between 7am to 7pm - Pager - [phone]. After 7pm go to [email or web address] - password [email or web address]   [facility] - Office  [phone]    Patient told to have "everything is happening"

## 2018-07-03 ENCOUNTER — Encounter (HOSPITAL_COMMUNITY): Payer: Self-pay

## 2018-07-03 LAB — COMPREHENSIVE METABOLIC PANEL
ALT: 22 U/L (ref 0–44)
AST: 17 U/L (ref 15–41)
Albumin: 3.5 g/dL (ref 3.5–5.0)
Alkaline Phosphatase: 55 U/L (ref 38–126)
Anion gap: 7 (ref 5–15)
BUN: 11 mg/dL (ref 6–20)
CO2: 26 mmol/L (ref 22–32)
Calcium: 8.2 mg/dL — ABNORMAL LOW (ref 8.9–10.3)
Chloride: 107 mmol/L (ref 98–111)
Creatinine, Ser: 0.93 mg/dL (ref 0.61–1.24)
GFR calc Af Amer: >60 mL/min (ref >60)
GFR calc non Af Amer: >60 mL/min (ref >60)
GLUCOSE: 98 mg/dL (ref 70–99)
POTASSIUM: 3.2 mmol/L — AB (ref 3.5–5.1)
Sodium: 140 mmol/L (ref 135–145)
Total Bilirubin: 1.4 mg/dL — ABNORMAL HIGH (ref 0.3–1.2)
Total Protein: 6.4 g/dL — ABNORMAL LOW (ref 6.5–8.1)

## 2018-07-03 LAB — CBC
HCT: 38.8 % — ABNORMAL LOW (ref 39.0–52.0)
Hemoglobin: 11.9 g/dL — ABNORMAL LOW (ref 13.0–17.0)
MCH: 27.7 pg (ref 26.0–34.0)
MCHC: 30.7 g/dL (ref 30.0–36.0)
MCV: 90.2 fL (ref 80.0–100.0)
NRBC: 0 % (ref 0.0–0.2)
Platelets: 175 10*3/uL (ref 150–400)
RBC: 4.3 MIL/uL (ref 4.22–5.81)
RDW: 14.5 % (ref 11.5–15.5)
WBC: 4.5 10*3/uL (ref 4.0–10.5)

## 2018-07-03 LAB — GLUCOSE, CAPILLARY
Glucose-Capillary: 96 mg/dL (ref 70–99)
Glucose-Capillary: 129 mg/dL — ABNORMAL HIGH (ref 70–99)

## 2018-07-03 LAB — TROPONIN I
Troponin I: 0.03 ng/mL (ref <0.03)
Troponin I: <0.03 ng/mL (ref <0.03)

## 2018-07-03 MED ORDER — SODIUM CHLORIDE 0.9% FLUSH
3.0000 mL | Freq: Two times a day (BID) | INTRAVENOUS | Status: DC
  Administered 2018-07-03 – 2018-07-05 (×7): 3 mL via INTRAVENOUS

## 2018-07-03 MED ORDER — TAMSULOSIN HCL 0.4 MG PO CAPS
0.4000 mg | ORAL_CAPSULE | Freq: Every day | ORAL | Status: DC
  Administered 2018-07-03 – 2018-07-06 (×4): 0.4 mg via ORAL
  Filled 2018-07-03 (×4): qty 1

## 2018-07-03 MED ORDER — BUDESONIDE 0.25 MG/2ML IN SUSP
2.0000 mL | Freq: Two times a day (BID) | RESPIRATORY_TRACT | Status: DC
  Administered 2018-07-03 – 2018-07-06 (×7): 0.25 mg via RESPIRATORY_TRACT
  Filled 2018-07-03 (×7): qty 2

## 2018-07-03 MED ORDER — POTASSIUM CHLORIDE CRYS ER 20 MEQ PO TBCR
20.0000 meq | EXTENDED_RELEASE_TABLET | Freq: Two times a day (BID) | ORAL | Status: DC
  Administered 2018-07-03 (×3): 20 meq via ORAL
  Filled 2018-07-03 (×3): qty 1

## 2018-07-03 MED ORDER — SODIUM CHLORIDE 0.9% FLUSH: 3.0000 mL | INTRAVENOUS | Status: DC | PRN

## 2018-07-03 MED ORDER — BECLOMETHASONE DIPROPIONATE 80 MCG/ACT IN AERS: 2.0000 | INHALATION_SPRAY | Freq: Two times a day (BID) | RESPIRATORY_TRACT | Status: DC

## 2018-07-03 MED ORDER — ATORVASTATIN CALCIUM 40 MG PO TABS
40.0000 mg | ORAL_TABLET | Freq: Every day | ORAL | Status: DC
  Administered 2018-07-03 – 2018-07-05 (×3): 40 mg via ORAL
  Filled 2018-07-03 (×3): qty 1

## 2018-07-03 MED ORDER — DOCUSATE SODIUM 100 MG PO CAPS
100.0000 mg | ORAL_CAPSULE | Freq: Two times a day (BID) | ORAL | Status: DC
  Administered 2018-07-03 – 2018-07-06 (×8): 100 mg via ORAL
  Filled 2018-07-03 (×8): qty 1

## 2018-07-03 MED ORDER — ONDANSETRON HCL 4 MG/2ML IJ SOLN: 4.0000 mg | Freq: Four times a day (QID) | INTRAMUSCULAR | Status: DC | PRN

## 2018-07-03 MED ORDER — LISINOPRIL 5 MG PO TABS
5.0000 mg | ORAL_TABLET | Freq: Every day | ORAL | Status: DC
  Administered 2018-07-03 – 2018-07-06 (×4): 5 mg via ORAL
  Filled 2018-07-03 (×4): qty 1

## 2018-07-03 MED ORDER — ASPIRIN 81 MG PO CHEW
81.0000 mg | CHEWABLE_TABLET | Freq: Every day | ORAL | Status: DC
  Administered 2018-07-03 – 2018-07-06 (×4): 81 mg via ORAL
  Filled 2018-07-03 (×4): qty 1

## 2018-07-03 MED ORDER — SODIUM CHLORIDE 0.9 % IV SOLN: 250.0000 mL | INTRAVENOUS | Status: DC | PRN

## 2018-07-03 MED ORDER — FUROSEMIDE 10 MG/ML IJ SOLN
80.0000 mg | Freq: Two times a day (BID) | INTRAMUSCULAR | Status: DC
  Administered 2018-07-03 – 2018-07-06 (×7): 80 mg via INTRAVENOUS
  Filled 2018-07-03 (×7): qty 8

## 2018-07-03 MED ORDER — RIVAROXABAN 20 MG PO TABS
20.0000 mg | ORAL_TABLET | Freq: Every day | ORAL | Status: DC
  Administered 2018-07-03 – 2018-07-06 (×4): 20 mg via ORAL
  Filled 2018-07-03 (×4): qty 1

## 2018-07-03 MED ORDER — PANTOPRAZOLE SODIUM 40 MG PO TBEC
40.0000 mg | DELAYED_RELEASE_TABLET | Freq: Every evening | ORAL | Status: DC
  Administered 2018-07-03 – 2018-07-05 (×3): 40 mg via ORAL
  Filled 2018-07-03 (×3): qty 1

## 2018-07-03 MED ORDER — SPIRONOLACTONE 25 MG PO TABS
100.0000 mg | ORAL_TABLET | Freq: Every day | ORAL | Status: DC
  Administered 2018-07-03 – 2018-07-06 (×4): 100 mg via ORAL
  Filled 2018-07-03 (×4): qty 4

## 2018-07-03 MED ORDER — FERROUS GLUCONATE 324 (38 FE) MG PO TABS
324.0000 mg | ORAL_TABLET | Freq: Two times a day (BID) | ORAL | Status: DC
  Administered 2018-07-03 – 2018-07-06 (×7): 324 mg via ORAL
  Filled 2018-07-03 (×11): qty 1

## 2018-07-03 MED ORDER — CARVEDILOL 12.5 MG PO TABS
37.5000 mg | ORAL_TABLET | Freq: Two times a day (BID) | ORAL | Status: DC
  Administered 2018-07-03 – 2018-07-06 (×8): 37.5 mg via ORAL
  Filled 2018-07-03 (×8): qty 3

## 2018-07-03 MED ORDER — ONDANSETRON HCL 4 MG PO TABS: 4.0000 mg | ORAL_TABLET | Freq: Four times a day (QID) | ORAL | Status: DC | PRN

## 2018-07-03 NOTE — Progress Notes (Signed)
PROGRESS NOTE    Samuel KlippelMark T Fitzpatrick  WGN:[id number]:[id number]  DOB: 06/17/1966  DOA: 07/02/2018 PCP: Pearson GrippeKim, [name], MD   Brief Admission Hx: 53 y.o. male with long history of nonadherence, with history of asthma, atrial fibrillation on anticoagulation with Xarelto, CHF with nonischemic cardiomyopathy, EF 55% as per echo from October 2019, obstructive sleep apnea on home O2 as needed came with worsening shortness of breath and orthopnea.   MDM/Assessment & Plan:   1. Acute exacerbation of combined systolic and diastolic CHF-patient has had multiple admissions and gets better with IV diuresis.  He has diuresed >5L since admission.  He remains volume overloaded.  He has a long history of non-adherence.  Continue Lasix 80 mg IV every 12 hours.  Follow BMP,  Daily weights.  He had echocardiogram in October 2019 with EF reported at 50%.  2. Hypokalemia-potassium is 3.3, replaced in the ED.  Follow BMP and check magnesium.   3. Hypertension-continue home medication including Coreg, Aldactone, lisinopril.  4. Atrial fibrillation-heart rate is controlled continue carvedilol for rate control as well as Xarelto for anticoagulation.  5. BPH-continue tamsulosin.  6. History of asthma-continue Qvar   DVT Prophylaxis-   Xarelto  AM Labs Ordered, also please review Full Orders  Consultants:    Procedures:  n/a  Antimicrobials:  n/a   Subjective: Pt says that he remains SOB.    Objective: Vitals:   07/03/18 0000 07/03/18 0058 07/03/18 0617 07/03/18 0723  BP: (!) 153/106 (!) 187/111 (!) 150/93   Pulse: 86 81 69   Resp: 17 20 (!) 21   Temp:  98.4 F (36.9 C) 98 F (36.7 C)   TempSrc:  Oral Oral   SpO2: 96% 96% 99% 97%  Weight:  (!) 177 kg (!) 173.8 kg   Height:  6' (1.829 m)      Intake/Output Summary (Last 24 hours) at 07/03/2018 1234 Last data filed at 07/03/2018 [id number] Gross per 24 hour  Intake 240 ml  Output 5750 ml  Net -5510 ml   Filed Weights   07/02/18 2100 07/03/18 0058  07/03/18 0617  Weight: (!) 169.6 kg (!) 177 kg (!) 173.8 kg   REVIEW OF SYSTEMS  As per history otherwise all reviewed and reported negative  Exam:  General exam: morbidly obese with metabolic syndrome, NAD. Lying in bed, propped up on pillows.  Respiratory system: bibasilar crackles. No increased work of breathing. Cardiovascular system: normal S1 & S2 heard. Mild JVD, murmurs, gallops, clicks, 2+ pedal edema. Gastrointestinal system: Abdomen is nondistended, soft and nontender. Normal bowel sounds heard. Central nervous system: Alert and oriented. No focal neurological deficits. Extremities: 2+ pitting edema BLEs.  Data Reviewed: Basic Metabolic Panel: Recent Labs  Lab 07/02/18 2131 07/03/18 0528  NA 140 140  K 3.3* 3.2*  CL 107 107  CO2 27 26  GLUCOSE 97 98  BUN 12 11  CREATININE 0.90 0.93  CALCIUM 8.5* 8.2*   Liver Function Tests: Recent Labs  Lab 07/03/18 0528  AST 17  ALT 22  ALKPHOS 55  BILITOT 1.4*  PROT 6.4*  ALBUMIN 3.5   No results for input(s): LIPASE, AMYLASE in the last 168 hours. No results for input(s): AMMONIA in the last 168 hours. CBC: Recent Labs  Lab 07/02/18 2131 07/03/18 0528  WBC 5.3 4.5  NEUTROABS 3.6  --   HGB 12.1* 11.9*  HCT 39.1 38.8*  MCV 90.5 90.2  PLT 176 175   Cardiac Enzymes: Recent Labs  Lab 07/03/18 0529 07/03/18 0920  TROPONINI 0.03* <0.03   CBG (last 3)  Recent Labs    07/03/18 0739 07/03/18 1119  GLUCAP 96 129*   No results found for this or any previous visit (from the past 240 hour(s)).   Studies: Dg Chest Port 1 View  Result Date: 07/02/2018 CLINICAL DATA:  Shortness of breath starting today. Lethargic but arousable. Lower extremity edema. EXAM: PORTABLE CHEST 1 VIEW COMPARISON:  06/27/2018 FINDINGS: Cardiac enlargement. Pulmonary vascular congestion with perihilar infiltration, likely edema. No blunting of costophrenic angles. No pneumothorax. Mediastinal contours appear intact. IMPRESSION: Cardiac  enlargement with pulmonary vascular congestion and perihilar edema. Electronically Signed   By: Burman Nieves M.D.   On: 07/02/2018 21:41   Scheduled Meds: . aspirin  81 mg Oral Daily  . atorvastatin  40 mg Oral q1800  . budesonide  2 mL Inhalation BID  . carvedilol  37.5 mg Oral BID WC  . docusate sodium  100 mg Oral BID  . ferrous gluconate  324 mg Oral BID WC  . furosemide  80 mg Intravenous Q12H  . lisinopril  5 mg Oral Daily  . pantoprazole  40 mg Oral QPM  . potassium chloride SA  20 mEq Oral BID  . rivaroxaban  20 mg Oral Q breakfast  . sodium chloride flush  3 mL Intravenous Q12H  . spironolactone  100 mg Oral Daily  . tamsulosin  0.4 mg Oral Daily   Continuous Infusions: . sodium chloride      Active Problems:   Acute exacerbation of CHF (congestive heart failure) (HCC)   Time spent:   Standley Dakins, MD, FAAFP [facility]  If 7PM-7AM, please contact night-coverage 07/03/2018, 12:34 PM    LOS: 0 days

## 2018-07-03 NOTE — Progress Notes (Signed)
CRITICAL VALUE ALERT  Critical Value:  Trop 0.03  Date & Time Notied:  07/03/2018 0720  Provider Notified: Laural Benes, MD  Orders Received/Actions taken: awaiting further instructions

## 2018-07-03 NOTE — ED Notes (Signed)
ED TO INPATIENT HANDOFF REPORT  Name/Age/Gender [name] 53 y.o. male  Code Status Code Status History    Date Active Date Inactive Code Status Order ID Comments User Context   06/11/2018 1046 06/13/2018 1610 Full Code [id number]  Erick Blinks, DO ED   04/01/2018 0538 04/03/2018 1426 Full Code [id number]  Bobette Mo, MD ED   03/11/2018 1154 03/14/2018 1638 Full Code [id number]  Henderson Cloud, MD Inpatient   09/08/2017 1613 09/11/2017 1911 DNR [id number]  Maurilio Lovely D, DO ED   09/08/2017 1552 09/08/2017 1613 Full Code [id number]  Maurilio Lovely D, DO ED   05/11/2017 0744 05/13/2017 1619 Full Code [id number]  Bobette Mo, MD Inpatient   05/09/2017 1728 05/10/2017 0341 DNR [id number]  Erick Blinks, MD Inpatient   05/09/2017 0520 05/09/2017 1728 Full Code [id number]  Clydie Braun, MD ED   04/19/2017 2341 04/23/2017 1625 Full Code [id number]  Bobette Mo, MD Inpatient   04/11/2017 1710 04/13/2017 1541 DNR [id number]  Erick Blinks, MD Inpatient   03/01/2017 1134 03/01/2017 1526 DNR [id number]  Katheran Awe, NP ED   02/18/2017 1712 02/19/2017 1444 Full Code [id number]  Houston Siren, MD Inpatient   02/05/2017 1947 02/08/2017 1752 Full Code [id number]  Catarina Hartshorn, MD Inpatient   01/18/2017 1159 01/19/2017 1906 Full Code [id number]  Marinda Elk, MD Inpatient   12/03/2016 0059 12/06/2016 1628 Full Code [id number]  Houston Siren, MD Inpatient   11/09/2016 1512 11/13/2016 1650 Full Code [id number]  Filbert Schilder, MD Inpatient   10/24/2016 1733 10/27/2016 1853 Full Code [id number]  Briscoe Deutscher, MD ED   10/01/2016 0410 10/01/2016 1612 Full Code [id number]  Pearson Grippe, MD Inpatient   08/19/2016 0321 08/21/2016 1720 Full Code [id number]  Haydee Monica, MD Inpatient   07/19/2016 1617 07/25/2016 1503 Full Code [id number]  Henderson Cloud, MD Inpatient   06/22/2016 0620 06/25/2016 1738 Full Code [id number]  Pearson Grippe, MD Inpatient   05/17/2016 2035 05/22/2016 1754 Full Code  [id number]  Briscoe Deutscher, MD ED   04/04/2016 1547 04/07/2016 1426 Full Code [id number]  Clydia Llano, MD Inpatient   01/21/2016 0217 01/25/2016 2217 Full Code [id number]  Meredeth Ide, MD Inpatient   01/05/2016 0744 01/09/2016 1658 Full Code [id number]  Haydee Monica, MD Inpatient   12/21/2015 1647 12/25/2015 1829 Full Code [id number]  Briscoe Deutscher, MD ED   12/03/2015 0351 12/04/2015 1845 Full Code [id number]  Pearson Grippe, MD Inpatient   11/25/2015 1916 11/27/2015 1702 Full Code [id number]  Pearson Grippe, MD Inpatient   11/18/2015 0110 11/21/2015 1844 Full Code [id number]  Houston Siren, MD Inpatient   11/05/2015 1611 11/09/2015 2006 Full Code [id number]  Standley Brooking, MD Inpatient   10/26/2015 1650 10/31/2015 1648 Full Code [id number]  Marcos Eke, PA-C ED   10/26/2015 1554 10/26/2015 1650 Full Code [id number]  Marcos Eke, PA-C ED   10/07/2015 2037 10/15/2015 1700 Full Code [id number]  Meredeth Ide, MD Inpatient   09/28/2015 1646 09/30/2015 1635 Full Code [id number]  John Giovanni, MD Inpatient   09/18/2015 2331 09/22/2015 1449 Full Code [id number]  Houston Siren, MD ED   09/10/2015 1950 09/15/2015 1806 Full Code [id number]  Standley Brooking, MD Inpatient   06/14/2015 1731 06/17/2015 1634 Full Code [id number]  [name], MD Inpatient   06/14/2015 1104 06/14/2015 1731 Full Code [id number]  Runell Gess, MD Inpatient   06/13/2015 1650 06/14/2015 1104 Full Code [id number]  Conley Rolls,  Theron Arista, MD Inpatient   05/28/2015 [phone] 05/29/2015 1718 Full Code [id number]  Haydee Monica, MD Inpatient   04/01/2015 1241 04/02/2015 1609 Full Code [id number]  Erick Blinks, MD Inpatient   08/08/2014 1948 08/10/2014 1628 Full Code [id number]  Ozella Rocks, MD Inpatient   06/28/2014 0525 06/30/2014 1930 Full Code [id number]  Haydee Monica, MD Inpatient   04/02/2014 1436 04/04/2014 1933 Full Code [id number]  Gwenyth Bender, NP Inpatient      Home/SNF/Other Home  Chief Complaint Shortness Of Breath  Level of Care/Admitting Diagnosis ED  Disposition    ED Disposition Condition Comment   Admit  Hospital Area: [facility] [[id number]]  Level of Care: Telemetry [5]  Diagnosis: Acute exacerbation of CHF (congestive heart failure) [facility]) [336122]  Admitting Physician: Lovie Chol  Attending Physician: Meredeth Ide [[id number]]  PT Class (Do Not Modify): Observation [104]  PT Acc Code (Do Not Modify): Observation [10022]       Medical History Past Medical History:  Diagnosis Date  . Allergic rhinitis   . Asthma   . Atrial fibrillation (HCC)   . CHF (congestive heart failure) (HCC)   . Essential hypertension   . Gastroesophageal reflux disease   . Heart attack (HCC)   . History of cardiac catheterization    Normal coronaries December 2016  . Noncompliance    Major problem leading to declining health and recurrent hospitalization  . Nonischemic cardiomyopathy (HCC)    LVEF 20-25%  . On home O2    3L N/C  . OSA (obstructive sleep apnea)   . Osteoarthritis   . Peptic ulcer disease     Allergies Allergies  Allergen Reactions  . Banana Shortness Of Breath  . Bee Venom Shortness Of Breath, Swelling and Other (See Comments)    Reaction:  Facial swelling  . Food Shortness Of Breath, Rash and Other (See Comments)    Pt states that he is allergic to strawberries.    . Aspirin Other (See Comments)    Reaction:  GI upset   . Metolazone Other (See Comments)    Pt states that he stopped taking this med due to heart attack like symptoms.   . Oatmeal Nausea And Vomiting  . Orange Juice [Orange Oil] Nausea And Vomiting    All acidic products make him nauseous and upset stomach  . Torsemide Swelling and Other (See Comments)    Reaction:  Swelling of feet/legs   . Diltiazem Palpitations  . Hydralazine Palpitations  . Lipitor [Atorvastatin] Other (See Comments)    Reaction:  Nose bleeds     IV Location/Drains/Wounds Patient Lines/Drains/Airways Status   Active Line/Drains/Airways    Name:   Placement date:    Placement time:   Site:   Days:   Peripheral IV 07/02/18 Left Hand   07/02/18    2107    Hand   1          Labs/Imaging Results for orders placed or performed during the hospital encounter of 07/02/18 (from the past 48 hour(s))  Basic metabolic panel     Status: Abnormal   Collection Time: 07/02/18  9:31 PM  Result Value Ref Range   Sodium 140 135 - 145 mmol/L   Potassium 3.3 (L) 3.5 - 5.1 mmol/L   Chloride 107 98 - 111 mmol/L   CO2 27 22 - 32 mmol/L   Glucose, Bld 97 70 - 99 mg/dL   BUN 12 6 - 20 mg/dL   Creatinine, Ser  0.90 0.61 - 1.24 mg/dL   Calcium 8.5 (L) 8.9 - 10.3 mg/dL   GFR calc non Af Amer >60 >60 mL/min   GFR calc Af Amer >60 >60 mL/min   Anion gap 6 5 - 15    Comment: Performed at [facility], [address] [place], [place] [address]  CBC with Differential/Platelet     Status: Abnormal   Collection Time: 07/02/18  9:31 PM  Result Value Ref Range   WBC 5.3 4.0 - 10.5 K/uL   RBC 4.32 4.22 - 5.81 MIL/uL   Hemoglobin 12.1 (L) 13.0 - 17.0 g/dL   HCT 40.939.1 81.139.0 - 91.452.0 %   MCV 90.5 80.0 - 100.0 fL   MCH 28.0 26.0 - 34.0 pg   MCHC 30.9 30.0 - 36.0 g/dL   RDW 78.214.3 95.611.5 - 21.315.5 %   Platelets 176 150 - 400 K/uL   nRBC 0.0 0.0 - 0.2 %   Neutrophils Relative % 68 %   Neutro Abs 3.6 1.7 - 7.7 K/uL   Lymphocytes Relative 21 %   Lymphs Abs 1.1 0.7 - 4.0 K/uL   Monocytes Relative 9 %   Monocytes Absolute 0.5 0.1 - 1.0 K/uL   Eosinophils Relative 2 %   Eosinophils Absolute 0.1 0.0 - 0.5 K/uL   Basophils Relative 0 %   Basophils Absolute 0.0 0.0 - 0.1 K/uL   Immature Granulocytes 0 %   Abs Immature Granulocytes 0.02 0.00 - 0.07 K/uL    Comment: Performed at [facility], [address] [place], [place] [address]  Brain natriuretic peptide (order ONLY if patient c/o SOB)     Status: Abnormal   Collection Time: 07/02/18  9:32 PM  Result Value Ref Range   B Natriuretic Peptide 190.0 (H) 0.0 - 100.0 pg/mL    Comment: Performed at [facility], [address]  [place], [place] [address]  I-stat troponin, ED     Status: None   Collection Time: 07/02/18  9:41 PM  Result Value Ref Range   Troponin i, poc 0.02 0.00 - 0.08 ng/mL   Comment 3            Comment: Due to the release kinetics of cTnI, a negative result within the first hours of the onset of symptoms does not rule out myocardial infarction with certainty. If myocardial infarction is still suspected, repeat the test at appropriate intervals.    Dg Chest Port 1 View  Result Date: 07/02/2018 CLINICAL DATA:  Shortness of breath starting today. Lethargic but arousable. Lower extremity edema. EXAM: PORTABLE CHEST 1 VIEW COMPARISON:  06/27/2018 FINDINGS: Cardiac enlargement. Pulmonary vascular congestion with perihilar infiltration, likely edema. No blunting of costophrenic angles. No pneumothorax. Mediastinal contours appear intact. IMPRESSION: Cardiac enlargement with pulmonary vascular congestion and perihilar edema. Electronically Signed   By: Burman NievesWilliam  Stevens M.D.   On: 07/02/2018 21:41    Pending Labs [employer]ed Labs (From admission, onward)    Start     Ordered   Signed and Held  CBC  Tomorrow morning,   R     Signed and Held   Signed and Held  Comprehensive metabolic panel  Tomorrow morning,   R     Signed and Held   Signed and Held  Troponin I - Now Then Q6H  Now then every 6 hours,   R     Signed and Held          Vitals/Pain Today's Vitals   07/02/18 2200 07/02/18 2232 07/02/18 2300  07/02/18 2331  BP: (!) 157/98  (!) 163/91   Pulse: 97  (!) 104   Resp: 20  20   Temp:      TempSrc:      SpO2: 96%  98%   Weight:      Height:      PainSc:  5   3     Isolation Precautions No active isolations  Medications Medications  albuterol (PROVENTIL) (2.5 MG/3ML) 0.083% nebulizer solution 5 mg (5 mg Nebulization Given 07/02/18 2112)  furosemide (LASIX) injection 80 mg (80 mg Intravenous Given 07/02/18 2234)  acetaminophen (TYLENOL) tablet 650 mg (650 mg Oral Given 07/02/18 2233)   potassium chloride SA (K-DUR,KLOR-CON) CR tablet 20 mEq (20 mEq Oral Given 07/02/18 2328)    Mobility walks

## 2018-07-04 ENCOUNTER — Ambulatory Visit: Payer: Medicare Other

## 2018-07-04 DIAGNOSIS — I110 Hypertensive heart disease with heart failure: Secondary | ICD-10-CM | POA: Diagnosis present

## 2018-07-04 DIAGNOSIS — I5043 Acute on chronic combined systolic (congestive) and diastolic (congestive) heart failure: Secondary | ICD-10-CM | POA: Diagnosis not present

## 2018-07-04 DIAGNOSIS — I252 Old myocardial infarction: Secondary | ICD-10-CM | POA: Diagnosis not present

## 2018-07-04 DIAGNOSIS — I5021 Acute systolic (congestive) heart failure: Secondary | ICD-10-CM | POA: Diagnosis present

## 2018-07-04 DIAGNOSIS — Z8249 Family history of ischemic heart disease and other diseases of the circulatory system: Secondary | ICD-10-CM | POA: Diagnosis not present

## 2018-07-04 DIAGNOSIS — I428 Other cardiomyopathies: Secondary | ICD-10-CM | POA: Diagnosis present

## 2018-07-04 DIAGNOSIS — Z7982 Long term (current) use of aspirin: Secondary | ICD-10-CM | POA: Diagnosis not present

## 2018-07-04 DIAGNOSIS — N400 Enlarged prostate without lower urinary tract symptoms: Secondary | ICD-10-CM | POA: Diagnosis present

## 2018-07-04 DIAGNOSIS — Z6841 Body Mass Index (BMI) 40.0 and over, adult: Secondary | ICD-10-CM | POA: Diagnosis not present

## 2018-07-04 DIAGNOSIS — Z9981 Dependence on supplemental oxygen: Secondary | ICD-10-CM | POA: Diagnosis not present

## 2018-07-04 DIAGNOSIS — Z7901 Long term (current) use of anticoagulants: Secondary | ICD-10-CM | POA: Diagnosis not present

## 2018-07-04 DIAGNOSIS — Z66 Do not resuscitate: Secondary | ICD-10-CM | POA: Diagnosis present

## 2018-07-04 DIAGNOSIS — I4891 Unspecified atrial fibrillation: Secondary | ICD-10-CM | POA: Diagnosis present

## 2018-07-04 DIAGNOSIS — Z79899 Other long term (current) drug therapy: Secondary | ICD-10-CM | POA: Diagnosis not present

## 2018-07-04 DIAGNOSIS — Z91018 Allergy to other foods: Secondary | ICD-10-CM | POA: Diagnosis not present

## 2018-07-04 DIAGNOSIS — I10 Essential (primary) hypertension: Secondary | ICD-10-CM | POA: Diagnosis not present

## 2018-07-04 DIAGNOSIS — Z87891 Personal history of nicotine dependence: Secondary | ICD-10-CM | POA: Diagnosis not present

## 2018-07-04 DIAGNOSIS — E876 Hypokalemia: Secondary | ICD-10-CM | POA: Diagnosis present

## 2018-07-04 DIAGNOSIS — R0602 Shortness of breath: Secondary | ICD-10-CM | POA: Diagnosis present

## 2018-07-04 DIAGNOSIS — J45909 Unspecified asthma, uncomplicated: Secondary | ICD-10-CM | POA: Diagnosis present

## 2018-07-04 DIAGNOSIS — I878 Other specified disorders of veins: Secondary | ICD-10-CM | POA: Diagnosis not present

## 2018-07-04 DIAGNOSIS — Z9119 Patient's noncompliance with other medical treatment and regimen: Secondary | ICD-10-CM | POA: Diagnosis not present

## 2018-07-04 LAB — RENAL FUNCTION PANEL
Albumin: 3.4 g/dL — ABNORMAL LOW (ref 3.5–5.0)
Anion gap: 6 (ref 5–15)
BUN: 14 mg/dL (ref 6–20)
CO2: 26 mmol/L (ref 22–32)
Calcium: 8.2 mg/dL — ABNORMAL LOW (ref 8.9–10.3)
Chloride: 107 mmol/L (ref 98–111)
Creatinine, Ser: 1.08 mg/dL (ref 0.61–1.24)
GFR calc Af Amer: >60 mL/min (ref >60)
GFR calc non Af Amer: >60 mL/min (ref >60)
Glucose, Bld: 93 mg/dL (ref 70–99)
POTASSIUM: 3.3 mmol/L — AB (ref 3.5–5.1)
Phosphorus: 3.3 mg/dL (ref 2.5–4.6)
Sodium: 139 mmol/L (ref 135–145)

## 2018-07-04 LAB — CBC
HCT: 38.9 % — ABNORMAL LOW (ref 39.0–52.0)
HEMOGLOBIN: 12.1 g/dL — AB (ref 13.0–17.0)
MCH: 28.3 pg (ref 26.0–34.0)
MCHC: 31.1 g/dL (ref 30.0–36.0)
MCV: 91.1 fL (ref 80.0–100.0)
Platelets: 182 10*3/uL (ref 150–400)
RBC: 4.27 MIL/uL (ref 4.22–5.81)
RDW: 14.4 % (ref 11.5–15.5)
WBC: 5 10*3/uL (ref 4.0–10.5)
nRBC: 0 % (ref 0.0–0.2)

## 2018-07-04 LAB — MAGNESIUM: Magnesium: 2.2 mg/dL (ref 1.7–2.4)

## 2018-07-04 MED ORDER — POTASSIUM CHLORIDE CRYS ER 20 MEQ PO TBCR
40.0000 meq | EXTENDED_RELEASE_TABLET | Freq: Two times a day (BID) | ORAL | Status: DC
  Administered 2018-07-04 – 2018-07-06 (×5): 40 meq via ORAL
  Filled 2018-07-04 (×5): qty 2

## 2018-07-04 MED ORDER — ACETAMINOPHEN 325 MG PO TABS
650.0000 mg | ORAL_TABLET | Freq: Four times a day (QID) | ORAL | Status: DC | PRN
  Administered 2018-07-04: 650 mg via ORAL
  Filled 2018-07-04: qty 2

## 2018-07-04 NOTE — Progress Notes (Signed)
PROGRESS NOTE  [name]  FVC:[id number]  DOB: 02-09-1966  DOA: 07/02/2018 PCP: Pearson Grippe, MD   Brief Admission Hx: 53 y.o. male with long history of nonadherence, with history of asthma, atrial fibrillation on anticoagulation with Xarelto, CHF with nonischemic cardiomyopathy, EF 55% as per echo from October 2019, obstructive sleep apnea on home O2 as needed came with worsening shortness of breath and orthopnea.   MDM/Assessment & Plan:   1. Acute exacerbation of combined systolic and diastolic CHF-patient has had multiple admissions and gets better with IV diuresis.  He has diuresed >8L since admission.  He remains volume overloaded.  He has a long history of non-adherence.  Continue Lasix 80 mg IV every 12 hours.  Follow BMP,  Daily weights.  He had echocardiogram in October 2019 with EF reported at 50%.  2. Hypokalemia-increased potassium supplement.  Follow BMP and magnesium.   3. Hypertension-continue home medication including Coreg, Aldactone, lisinopril.  4. Atrial fibrillation-heart rate is controlled continue carvedilol for rate control as well as Xarelto for anticoagulation.  DC telemetry.   5. BPH-continue tamsulosin.  6. History of asthma-continue Qvar   DVT Prophylaxis-   Xarelto  AM Labs Ordered, also please review Full Orders  Consultants:    Procedures:  n/a  Antimicrobials:  n/a   Subjective: Pt reports that he has less swelling in legs, still with SOB but some better than yesterday.     Objective: Vitals:   07/03/18 1902 07/03/18 1937 07/03/18 2137 07/04/18 0612  BP: 110/70  123/78 127/83  Pulse: 65  63 67  Resp: 20  20 20   Temp: 98.8 F (37.1 C)  98.5 F (36.9 C) 98.2 F (36.8 C)  TempSrc:   Oral Oral  SpO2: 100% 98% 100% 96%  Weight:      Height:        Intake/Output Summary (Last 24 hours) at 07/04/2018 1029 Last data filed at 07/04/2018 0900 Gross per 24 hour  Intake 480 ml  Output 3450 ml  Net -2970 ml   Filed  Weights   07/02/18 2100 07/03/18 0058 07/03/18 0617  Weight: (!) 169.6 kg (!) 177 kg (!) 173.8 kg   REVIEW OF SYSTEMS  As per history otherwise all reviewed and reported negative  Exam:  General exam: morbidly obese, NAD. Lying in bed, propped up on pillows.  Respiratory system: bibasilar crackles. No increased work of breathing. Cardiovascular system: normal S1 & S2 heard. Mild JVD, murmurs, gallops, clicks, 2+ pedal edema. Gastrointestinal system: Abdomen is nondistended, soft and nontender. Normal bowel sounds heard. Central nervous system: Alert and oriented. No focal neurological deficits. Extremities: 2+ pitting edema BLEs. Severe chronic venous stasis BLEs.   Data Reviewed: Basic Metabolic Panel: Recent Labs  Lab 07/02/18 2131 07/03/18 0528 07/04/18 0429  NA 140 140 139  K 3.3* 3.2* 3.3*  CL 107 107 107  CO2 27 26 26   GLUCOSE 97 98 93  BUN 12 11 14   CREATININE 0.90 0.93 1.08  CALCIUM 8.5* 8.2* 8.2*  MG  --   --  2.2  PHOS  --   --  3.3   Liver Function Tests: Recent Labs  Lab 07/03/18 0528 07/04/18 0429  AST 17  --   ALT 22  --   ALKPHOS 55  --   BILITOT 1.4*  --   PROT 6.4*  --   ALBUMIN 3.5 3.4*   No results for input(s): LIPASE, AMYLASE in the last 168 hours. No results for input(s): AMMONIA in  the last 168 hours. CBC: Recent Labs  Lab 07/02/18 2131 07/03/18 0528 07/04/18 0429  WBC 5.3 4.5 5.0  NEUTROABS 3.6  --   --   HGB 12.1* 11.9* 12.1*  HCT 39.1 38.8* 38.9*  MCV 90.5 90.2 91.1  PLT 176 175 182   Cardiac Enzymes: Recent Labs  Lab 07/03/18 0529 07/03/18 0920  TROPONINI 0.03* <0.03   CBG (last 3)  Recent Labs    07/03/18 0739 07/03/18 1119  GLUCAP 96 129*   No results found for this or any previous visit (from the past 240 hour(s)).   Studies: Dg Chest Port 1 View  Result Date: 07/02/2018 CLINICAL DATA:  Shortness of breath starting today. Lethargic but arousable. Lower extremity edema. EXAM: PORTABLE CHEST 1 VIEW  COMPARISON:  06/27/2018 FINDINGS: Cardiac enlargement. Pulmonary vascular congestion with perihilar infiltration, likely edema. No blunting of costophrenic angles. No pneumothorax. Mediastinal contours appear intact. IMPRESSION: Cardiac enlargement with pulmonary vascular congestion and perihilar edema. Electronically Signed   By: Burman Nieves M.D.   On: 07/02/2018 21:41   Scheduled Meds: . aspirin  81 mg Oral Daily  . atorvastatin  40 mg Oral q1800  . budesonide  2 mL Inhalation BID  . carvedilol  37.5 mg Oral BID WC  . docusate sodium  100 mg Oral BID  . ferrous gluconate  324 mg Oral BID WC  . furosemide  80 mg Intravenous Q12H  . lisinopril  5 mg Oral Daily  . pantoprazole  40 mg Oral QPM  . potassium chloride SA  40 mEq Oral BID  . rivaroxaban  20 mg Oral Q breakfast  . sodium chloride flush  3 mL Intravenous Q12H  . spironolactone  100 mg Oral Daily  . tamsulosin  0.4 mg Oral Daily   Continuous Infusions: . sodium chloride     Active Problems:   Acute exacerbation of CHF (congestive heart failure) (HCC)   Acute systolic CHF (congestive heart failure) (HCC)  Time spent:   Standley Dakins, MD [facility]  If 7PM-7AM, please contact night-coverage 07/04/2018, 10:29 AM    LOS: 0 days

## 2018-07-05 LAB — RENAL FUNCTION PANEL
Albumin: 3.5 g/dL (ref 3.5–5.0)
Anion gap: 6 (ref 5–15)
BUN: 15 mg/dL (ref 6–20)
CO2: 29 mmol/L (ref 22–32)
Calcium: 8.4 mg/dL — ABNORMAL LOW (ref 8.9–10.3)
Chloride: 107 mmol/L (ref 98–111)
Creatinine, Ser: 1.19 mg/dL (ref 0.61–1.24)
GFR calc Af Amer: >60 mL/min (ref >60)
GFR calc non Af Amer: >60 mL/min (ref >60)
Glucose, Bld: 89 mg/dL (ref 70–99)
Phosphorus: 3.4 mg/dL (ref 2.5–4.6)
Potassium: 3.9 mmol/L (ref 3.5–5.1)
SODIUM: 142 mmol/L (ref 135–145)

## 2018-07-05 LAB — MAGNESIUM: Magnesium: 2.1 mg/dL (ref 1.7–2.4)

## 2018-07-05 NOTE — Progress Notes (Signed)
PROGRESS NOTE  [name]  PQZ:[id number]  DOB: 01-22-1966  DOA: 07/02/2018 PCP: Pearson Grippe, MD   Brief Admission Hx: 53 y.o. male with long history of nonadherence, with history of asthma, atrial fibrillation on anticoagulation with Xarelto, CHF with nonischemic cardiomyopathy, EF 55% as per echo from October 2019, obstructive sleep apnea on home O2 as needed came with worsening shortness of breath and orthopnea.   MDM/Assessment & Plan:   1. Acute exacerbation of combined systolic and diastolic CHF-patient has had multiple admissions and gets better with IV diuresis.  He has diuresed >10L since admission.  He remains volume overloaded.  He has a long history of non-adherence.  Continue Lasix 80 mg IV every 12 hours as he continues to diurese and not at baseline dry weight.  Follow BMP,  Daily weights.  He had echocardiogram in October 2019 with EF reported at 50%.  2. Hypokalemia-increased potassium supplement.  Follow BMP and magnesium.   3. Hypertension-continue home medication including Coreg, Aldactone, lisinopril.  4. Atrial fibrillation-heart rate is controlled continue carvedilol for rate control as well as Xarelto for anticoagulation.  DC telemetry.   5. BPH-continue tamsulosin.  6. History of asthma-continue Qvar   DVT Prophylaxis-   Xarelto  AM Labs Ordered, also please review Full Orders  Consultants:    Procedures:  n/a  Antimicrobials:  n/a   Subjective: Pt reports that he is still SOB and not at baseline and still diuresing well.  Legs less edematous.     Objective: Vitals:   07/04/18 1922 07/04/18 2121 07/05/18 0500 07/05/18 0605  BP:  106/76  123/89  Pulse:  60  79  Resp:  (!) 24  20  Temp:  99 F (37.2 C)  99 F (37.2 C)  TempSrc:  Oral  Oral  SpO2: 97% 98%  100%  Weight:   (!) 168 kg   Height:        Intake/Output Summary (Last 24 hours) at 07/05/2018 1129 Last data filed at 07/05/2018 0836 Gross per 24 hour  Intake 720 ml    Output 3450 ml  Net -2730 ml   Filed Weights   07/03/18 0058 07/03/18 0617 07/05/18 0500  Weight: (!) 177 kg (!) 173.8 kg (!) 168 kg   REVIEW OF SYSTEMS  As per history otherwise all reviewed and reported negative  Exam:  General exam: morbidly obese, NAD. Lying in bed, propped up on pillows.  Respiratory system: bibasilar crackles. No increased work of breathing. Cardiovascular system: normal S1 & S2 heard. Mild JVD, murmurs, gallops, clicks, 2+ pedal edema. Gastrointestinal system: Abdomen is nondistended, soft and nontender. Normal bowel sounds heard. Central nervous system: Alert and oriented. No focal neurological deficits. Extremities: 2+ pitting edema BLEs. Severe chronic venous stasis BLEs.   Data Reviewed: Basic Metabolic Panel: Recent Labs  Lab 07/02/18 2131 07/03/18 0528 07/04/18 0429 07/05/18 0513  NA 140 140 139 142  K 3.3* 3.2* 3.3* 3.9  CL 107 107 107 107  CO2 27 26 26 29   GLUCOSE 97 98 93 89  BUN 12 11 14 15   CREATININE 0.90 0.93 1.08 1.19  CALCIUM 8.5* 8.2* 8.2* 8.4*  MG  --   --  2.2 2.1  PHOS  --   --  3.3 3.4   Liver Function Tests: Recent Labs  Lab 07/03/18 0528 07/04/18 0429 07/05/18 0513  AST 17  --   --   ALT 22  --   --   ALKPHOS 55  --   --  BILITOT 1.4*  --   --   PROT 6.4*  --   --   ALBUMIN 3.5 3.4* 3.5   No results for input(s): LIPASE, AMYLASE in the last 168 hours. No results for input(s): AMMONIA in the last 168 hours. CBC: Recent Labs  Lab 07/02/18 2131 07/03/18 0528 07/04/18 0429  WBC 5.3 4.5 5.0  NEUTROABS 3.6  --   --   HGB 12.1* 11.9* 12.1*  HCT 39.1 38.8* 38.9*  MCV 90.5 90.2 91.1  PLT 176 175 182   Cardiac Enzymes: Recent Labs  Lab 07/03/18 0529 07/03/18 0920  TROPONINI 0.03* <0.03   CBG (last 3)  Recent Labs    07/03/18 0739 07/03/18 1119  GLUCAP 96 129*   No results found for this or any previous visit (from the past 240 hour(s)).   Studies: No results found. Scheduled Meds: . aspirin   81 mg Oral Daily  . atorvastatin  40 mg Oral q1800  . budesonide  2 mL Inhalation BID  . carvedilol  37.5 mg Oral BID WC  . docusate sodium  100 mg Oral BID  . ferrous gluconate  324 mg Oral BID WC  . furosemide  80 mg Intravenous Q12H  . lisinopril  5 mg Oral Daily  . pantoprazole  40 mg Oral QPM  . potassium chloride SA  40 mEq Oral BID  . rivaroxaban  20 mg Oral Q breakfast  . sodium chloride flush  3 mL Intravenous Q12H  . spironolactone  100 mg Oral Daily  . tamsulosin  0.4 mg Oral Daily   Continuous Infusions: . sodium chloride     Active Problems:   Acute exacerbation of CHF (congestive heart failure) (HCC)   Acute systolic CHF (congestive heart failure) (HCC)  Time spent:   Standley Dakins, MD [facility]  If 7PM-7AM, please contact night-coverage 07/05/2018, 11:29 AM    LOS: 1 day

## 2018-07-06 ENCOUNTER — Ambulatory Visit: Payer: Medicare Other | Admitting: Cardiovascular Disease

## 2018-07-06 DIAGNOSIS — Z9119 Patient's noncompliance with other medical treatment and regimen: Secondary | ICD-10-CM

## 2018-07-06 DIAGNOSIS — I878 Other specified disorders of veins: Secondary | ICD-10-CM

## 2018-07-06 DIAGNOSIS — I10 Essential (primary) hypertension: Secondary | ICD-10-CM

## 2018-07-06 LAB — RENAL FUNCTION PANEL
Albumin: 3.5 g/dL (ref 3.5–5.0)
Anion gap: 10 (ref 5–15)
BUN: 18 mg/dL (ref 6–20)
CHLORIDE: 106 mmol/L (ref 98–111)
CO2: 26 mmol/L (ref 22–32)
Calcium: 8.4 mg/dL — ABNORMAL LOW (ref 8.9–10.3)
Creatinine, Ser: 1.18 mg/dL (ref 0.61–1.24)
GFR calc Af Amer: >60 mL/min (ref >60)
GFR calc non Af Amer: >60 mL/min (ref >60)
Glucose, Bld: 92 mg/dL (ref 70–99)
Phosphorus: 3.5 mg/dL (ref 2.5–4.6)
Potassium: 3.7 mmol/L (ref 3.5–5.1)
Sodium: 142 mmol/L (ref 135–145)

## 2018-07-06 LAB — MAGNESIUM: Magnesium: 2.2 mg/dL (ref 1.7–2.4)

## 2018-07-06 NOTE — Progress Notes (Signed)
Discharge instructions given with med changes and follow-up appointments and CHF education. All questions answered.

## 2018-07-06 NOTE — Discharge Instructions (Signed)
Seek medical care or return to ER if symptoms come back, worsen or new problems develop.   ° ° °

## 2018-07-06 NOTE — Discharge Summary (Signed)
Physician Discharge Summary  [name] OHF:[id number] DOB: 1966/04/25 DOA: 07/02/2018  PCP: Samuel Grippe, MD  Admit date: 07/02/2018 Discharge date: 07/06/2018  Admitted From: Home  Disposition: Home   Recommendations for Outpatient Follow-up:  1. Follow up with PCP in 2 weeks 2. Follow up with cardiology in 1 week  Discharge Condition: STABLE   CODE STATUS: DNR    Brief Hospitalization Summary: Please see all hospital notes, images, labs for full details of the hospitalization. HPI: Dr. Barbra Fitzpatrick [name]  is a 53 y.o. male, with history of asthma, atrial fibrillation on anticoagulation with Xarelto, CHF with nonischemic cardiomyopathy, EF 55% as per echo from October 2019, obstructive sleep apnea on home O2 as needed came with worsening shortness of breath and orthopnea.  Patient was discharged from [facility] where he was treated for CHF and is currently taking Bumex 2 mg 3 times a day.  Patient says that he has noticed increased fluid in his legs and also worsening shortness of breath.  Patient is not requiring oxygen in the ED.  O2 sats 95% on room air. He denies chest pain He denies nausea vomiting or diarrhea. He denies dysuria. He denies syncope.   In the ED patient was given 80 mg of Lasix IV x1.  BNP 190  Brief Admission Hx: 52 y.o.male with long history of nonadherence,with history of asthma, atrial fibrillation on anticoagulation with Xarelto, CHF with nonischemic cardiomyopathy, EF 55% as per echo from October 2019, obstructive sleep apnea on home O2 as needed came with worsening shortness of breath and orthopnea.   MDM/Assessment & Plan:   1. Acute exacerbation of combined systolic and diastolic CHF-patient has had multiple admissions and gets better with IV diuresis. He has diuresed >12L since admission.  He says that he feels like he is at his baseline and he is going to take his medications as prescribed now.  He has a long history of non-adherence.  He  was treated with Lasix 80 mg IV every 12 hours in the hospital and he diuresed well.  He had echocardiogram in October 2019 with EF reported at 50%.  2. Hypokalemia-REPLETED.  Follow up BMP with his cardiology and primary care teams outpatient.   3. Hypertension-continue home medication including Coreg, Aldactone, lisinopril.  4. Atrial fibrillation-heart rate is controlled continue carvedilol for rate control as well as Xarelto for anticoagulation.  DC telemetry.   5. BPH-continue tamsulosin.  6. History of asthma-continue Qvar  DVT Prophylaxis-Xarelto  AM Labs Ordered, also please review Full Orders  Consultants:    Procedures:  n/a  Antimicrobials:  n/a   Discharge Diagnoses:  Active Problems:   Acute exacerbation of CHF (congestive heart failure) (HCC)   Acute systolic CHF (congestive heart failure) (HCC)  Discharge Instructions: Discharge Instructions    (HEART FAILURE PATIENTS) Call MD:  Anytime you have any of the following symptoms: 1) 3 pound weight gain in 24 hours or 5 pounds in 1 week 2) shortness of breath, with or without a dry hacking cough 3) swelling in the hands, feet or stomach 4) if you have to sleep on extra pillows at night in order to breathe.   Complete by:  As directed    Call MD for:  difficulty breathing, headache or visual disturbances   Complete by:  As directed    Call MD for:  extreme fatigue   Complete by:  As directed    Diet - low sodium heart healthy   Complete by:  As directed    Increase activity slowly   Complete by:  As directed      Allergies as of 07/06/2018      Reactions   Banana Shortness Of Breath   Bee Venom Shortness Of Breath, Swelling, Other (See Comments)   Reaction:  Facial swelling   Food Shortness Of Breath, Rash, Other (See Comments)   Pt states that he is allergic to strawberries.     Aspirin Other (See Comments)   Reaction:  GI upset    Metolazone Other (See Comments)   Pt states that he  stopped taking this med due to heart attack like symptoms.    Oatmeal Nausea And Vomiting   Orange Juice [orange Oil] Nausea And Vomiting   All acidic products make him nauseous and upset stomach   Torsemide Swelling, Other (See Comments)   Reaction:  Swelling of feet/legs    Diltiazem Palpitations   Hydralazine Palpitations   Lipitor [atorvastatin] Other (See Comments)   Reaction:  Nose bleeds       Medication List    TAKE these medications   albuterol (2.5 MG/3ML) 0.083% nebulizer solution Commonly known as:  PROVENTIL Take 2.5 mg by nebulization every 6 (six) hours as needed for wheezing or shortness of breath.   aspirin 81 MG chewable tablet Chew 1 tablet (81 mg total) by mouth daily.   atorvastatin 40 MG tablet Commonly known as:  LIPITOR Take 1 tablet (40 mg total) by mouth daily at 6 PM.   beclomethasone 80 MCG/ACT inhaler Commonly known as:  QVAR Inhale 2 puffs into the lungs 2 (two) times daily.   bumetanide 2 MG tablet Commonly known as:  BUMEX Take 1 tablet (2 mg total) by mouth 3 (three) times daily.   carvedilol 25 MG tablet Commonly known as:  COREG Take 1.5 tablets (37.5 mg total) by mouth 2 (two) times daily with a meal. What changed:  additional instructions   docusate sodium 100 MG capsule Commonly known as:  COLACE Take 1 capsule (100 mg total) by mouth 2 (two) times daily.   ferrous gluconate 324 MG tablet Commonly known as:  FERGON Take 1 tablet (324 mg total) by mouth 2 (two) times daily with a meal.   lisinopril 5 MG tablet Commonly known as:  PRINIVIL,ZESTRIL Take 5 mg by mouth daily.   OXYGEN Inhale 3.5 L into the lungs daily.   pantoprazole 40 MG tablet Commonly known as:  PROTONIX Take 1 tablet (40 mg total) by mouth daily at 6 (six) AM. What changed:  when to take this   potassium chloride SA 20 MEQ tablet Commonly known as:  K-DUR,KLOR-CON Take 1 tablet (20 mEq total) by mouth 2 (two) times daily.   rivaroxaban 20 MG Tabs  tablet Commonly known as:  XARELTO Take 1 tablet (20 mg total) by mouth daily with breakfast. What changed:  when to take this   spironolactone 100 MG tablet Commonly known as:  ALDACTONE Take 100 mg by mouth daily. For fluid   tamsulosin 0.4 MG Caps capsule Commonly known as:  FLOMAX Take 1 capsule (0.4 mg total) by mouth daily.      Follow-up Information    Samuel Grippe, MD. Schedule an appointment as soon as possible for a visit in 2 week(s).   Specialty:  Internal Medicine Contact information: [address] [place] [address] [phone]        [name], MD. Schedule an appointment as soon as possible for  a visit in 1 week(s).   Specialty:  Cardiology Why:  Hospital Follow Up  Contact information: [address] [place] [address] [phone]          Allergies  Allergen Reactions  . Banana Shortness Of Breath  . Bee Venom Shortness Of Breath, Swelling and Other (See Comments)    Reaction:  Facial swelling  . Food Shortness Of Breath, Rash and Other (See Comments)    Pt states that he is allergic to strawberries.    . Aspirin Other (See Comments)    Reaction:  GI upset   . Metolazone Other (See Comments)    Pt states that he stopped taking this med due to heart attack like symptoms.   . Oatmeal Nausea And Vomiting  . Orange Juice [Orange Oil] Nausea And Vomiting    All acidic products make him nauseous and upset stomach  . Torsemide Swelling and Other (See Comments)    Reaction:  Swelling of feet/legs   . Diltiazem Palpitations  . Hydralazine Palpitations  . Lipitor [Atorvastatin] Other (See Comments)    Reaction:  Nose bleeds    Allergies as of 07/06/2018      Reactions   Banana Shortness Of Breath   Bee Venom Shortness Of Breath, Swelling, Other (See Comments)   Reaction:  Facial swelling   Food Shortness Of Breath, Rash, Other (See Comments)   Pt states that he is allergic to strawberries.     Aspirin Other (See  Comments)   Reaction:  GI upset    Metolazone Other (See Comments)   Pt states that he stopped taking this med due to heart attack like symptoms.    Oatmeal Nausea And Vomiting   Orange Juice [orange Oil] Nausea And Vomiting   All acidic products make him nauseous and upset stomach   Torsemide Swelling, Other (See Comments)   Reaction:  Swelling of feet/legs    Diltiazem Palpitations   Hydralazine Palpitations   Lipitor [atorvastatin] Other (See Comments)   Reaction:  Nose bleeds       Medication List    TAKE these medications   albuterol (2.5 MG/3ML) 0.083% nebulizer solution Commonly known as:  PROVENTIL Take 2.5 mg by nebulization every 6 (six) hours as needed for wheezing or shortness of breath.   aspirin 81 MG chewable tablet Chew 1 tablet (81 mg total) by mouth daily.   atorvastatin 40 MG tablet Commonly known as:  LIPITOR Take 1 tablet (40 mg total) by mouth daily at 6 PM.   beclomethasone 80 MCG/ACT inhaler Commonly known as:  QVAR Inhale 2 puffs into the lungs 2 (two) times daily.   bumetanide 2 MG tablet Commonly known as:  BUMEX Take 1 tablet (2 mg total) by mouth 3 (three) times daily.   carvedilol 25 MG tablet Commonly known as:  COREG Take 1.5 tablets (37.5 mg total) by mouth 2 (two) times daily with a meal. What changed:  additional instructions   docusate sodium 100 MG capsule Commonly known as:  COLACE Take 1 capsule (100 mg total) by mouth 2 (two) times daily.   ferrous gluconate 324 MG tablet Commonly known as:  FERGON Take 1 tablet (324 mg total) by mouth 2 (two) times daily with a meal.   lisinopril 5 MG tablet Commonly known as:  PRINIVIL,ZESTRIL Take 5 mg by mouth daily.   OXYGEN Inhale 3.5 L into the lungs daily.   pantoprazole 40 MG tablet Commonly known as:  PROTONIX Take 1 tablet (40  mg total) by mouth daily at 6 (six) AM. What changed:  when to take this   potassium chloride SA 20 MEQ tablet Commonly known as:   K-DUR,KLOR-CON Take 1 tablet (20 mEq total) by mouth 2 (two) times daily.   rivaroxaban 20 MG Tabs tablet Commonly known as:  XARELTO Take 1 tablet (20 mg total) by mouth daily with breakfast. What changed:  when to take this   spironolactone 100 MG tablet Commonly known as:  ALDACTONE Take 100 mg by mouth daily. For fluid   tamsulosin 0.4 MG Caps capsule Commonly known as:  FLOMAX Take 1 capsule (0.4 mg total) by mouth daily.       Procedures/Studies: Dg Chest Port 1 View  Result Date: 07/02/2018 CLINICAL DATA:  Shortness of breath starting today. Lethargic but arousable. Lower extremity edema. EXAM: PORTABLE CHEST 1 VIEW COMPARISON:  06/27/2018 FINDINGS: Cardiac enlargement. Pulmonary vascular congestion with perihilar infiltration, likely edema. No blunting of costophrenic angles. No pneumothorax. Mediastinal contours appear intact. IMPRESSION: Cardiac enlargement with pulmonary vascular congestion and perihilar edema. Electronically Signed   By: Burman NievesWilliam  Stevens M.D.   On: 07/02/2018 21:41   Dg Chest Port 1 View  Result Date: 06/11/2018 CLINICAL DATA:  Shortness of breath.  History of cardiomyopathy. EXAM: PORTABLE CHEST 1 VIEW COMPARISON:  Chest radiograph April 01, 2018 FINDINGS: Cardiac silhouette is similarly enlarged. Pulmonary vascular congestion and interstitial prominence. No pleural effusion or focal consolidation. No pneumothorax. Large body habitus. Osseous structures are non suspicious. IMPRESSION: 1. Similar cardiomegaly.  Increased interstitial edema. Electronically Signed   By: Awilda Metroourtnay  Bloomer M.D.   On: 06/11/2018 05:46      Subjective: Pt says he feels well.  He says that he feels like his baseline.  He wants to go home today.    Discharge Exam: Vitals:   07/06/18 0539 07/06/18 1053  BP: 123/80   Pulse: 82   Resp: 20   Temp: 98.6 F (37 C)   SpO2: 100% 98%   Vitals:   07/05/18 2215 07/06/18 0500 07/06/18 0539 07/06/18 1053  BP: (!) 112/99  123/80    Pulse: 63  82   Resp: 20  20   Temp: 98.9 F (37.2 C)  98.6 F (37 C)   TempSrc: Oral  Oral   SpO2: 96%  100% 98%  Weight:  (!) 166.2 kg    Height:       General exam: morbidly obese, NAD. Lying in bed, propped up on pillows.  Respiratory system: BBS clear to auscultation. No increased work of breathing. Cardiovascular system: normal S1 & S2 heard. Mild JVD, murmurs, gallops, clicks, 2+ pedal edema. Gastrointestinal system: Abdomen is nondistended, soft and nontender. Normal bowel sounds heard. Central nervous system: Alert and oriented. No focal neurological deficits. Extremities: 2+ edema BLEs. Severe chronic venous stasis BLEs.    The results of significant diagnostics from this hospitalization (including imaging, microbiology, ancillary and laboratory) are listed below for reference.    Microbiology: No results found for this or any previous visit (from the past 240 hour(s)).   Labs: BNP (last 3 results) Recent Labs    04/01/18 0331 06/11/18 0522 07/02/18 2132  BNP 115.0* 188.0* 190.0*   Basic Metabolic Panel: Recent Labs  Lab 07/02/18 2131 07/03/18 0528 07/04/18 0429 07/05/18 0513 07/06/18 0408  NA 140 140 139 142 142  K 3.3* 3.2* 3.3* 3.9 3.7  CL 107 107 107 107 106  CO2 27 26 26 29 26   GLUCOSE 97 98 93 89  92  BUN 12 11 14 15 18   CREATININE 0.90 0.93 1.08 1.19 1.18  CALCIUM 8.5* 8.2* 8.2* 8.4* 8.4*  MG  --   --  2.2 2.1 2.2  PHOS  --   --  3.3 3.4 3.5   Liver Function Tests: Recent Labs  Lab 07/03/18 0528 07/04/18 0429 07/05/18 0513 07/06/18 0408  AST 17  --   --   --   ALT 22  --   --   --   ALKPHOS 55  --   --   --   BILITOT 1.4*  --   --   --   PROT 6.4*  --   --   --   ALBUMIN 3.5 3.4* 3.5 3.5   No results for input(s): LIPASE, AMYLASE in the last 168 hours. No results for input(s): AMMONIA in the last 168 hours. CBC: Recent Labs  Lab 07/02/18 2131 07/03/18 0528 07/04/18 0429  WBC 5.3 4.5 5.0  NEUTROABS 3.6  --   --   HGB 12.1*  11.9* 12.1*  HCT 39.1 38.8* 38.9*  MCV 90.5 90.2 91.1  PLT 176 175 182   Cardiac Enzymes: Recent Labs  Lab 07/03/18 0529 07/03/18 0920  TROPONINI 0.03* <0.03   BNP: Invalid input(s): POCBNP CBG: Recent Labs  Lab 07/03/18 0739 07/03/18 1119  GLUCAP 96 129*   D-Dimer No results for input(s): DDIMER in the last 72 hours. Hgb A1c No results for input(s): HGBA1C in the last 72 hours. Lipid Profile No results for input(s): CHOL, HDL, LDLCALC, TRIG, CHOLHDL, LDLDIRECT in the last 72 hours. Thyroid function studies No results for input(s): TSH, T4TOTAL, T3FREE, THYROIDAB in the last 72 hours.  Invalid input(s): FREET3 Anemia work up No results for input(s): VITAMINB12, FOLATE, FERRITIN, TIBC, IRON, RETICCTPCT in the last 72 hours. Urinalysis    Component Value Date/Time   COLORURINE YELLOW 04/01/2018 0332   APPEARANCEUR CLEAR 04/01/2018 0332   LABSPEC 1.008 04/01/2018 0332   PHURINE 7.0 04/01/2018 0332   GLUCOSEU NEGATIVE 04/01/2018 0332   GLUCOSEU NEG mg/dL 01/65/5374 8270   HGBUR NEGATIVE 04/01/2018 0332   BILIRUBINUR NEGATIVE 04/01/2018 0332   KETONESUR NEGATIVE 04/01/2018 0332   PROTEINUR NEGATIVE 04/01/2018 0332   UROBILINOGEN 0.2 08/08/2014 1445   NITRITE NEGATIVE 04/01/2018 0332   LEUKOCYTESUR NEGATIVE 04/01/2018 0332   Sepsis Labs Invalid input(s): PROCALCITONIN,  WBC,  LACTICIDVEN Microbiology No results found for this or any previous visit (from the past 240 hour(s)).  Time coordinating discharge: 35 minutes   SIGNED:  Standley Dakins, MD  [facility] 07/06/2018, 12:18 PM

## 2018-07-07 ENCOUNTER — Ambulatory Visit: Payer: Self-pay

## 2018-07-07 ENCOUNTER — Other Ambulatory Visit: Payer: Self-pay

## 2018-07-07 NOTE — Patient Outreach (Signed)
[facility]) Care Management  [facility]  07/07/2018  [name] 04-Sep-1965 [id number]   Reason for call: post discharge medication review and medication management.  I had previously spoken with [name] and he was hospitalized the next day for increasing SOB and fluid.  Unsuccessful telephone call attempt #1  to patient.   Unable to leave message.  VM did not pick up.  Plan:  I will make another outreach attempt to patient within 3-4 business days.  [name], PharmD Clinical Pharmacist [facility] [phone]

## 2018-07-11 DIAGNOSIS — I5043 Acute on chronic combined systolic (congestive) and diastolic (congestive) heart failure: Secondary | ICD-10-CM | POA: Diagnosis not present

## 2018-07-12 ENCOUNTER — Ambulatory Visit: Payer: Self-pay

## 2018-07-12 ENCOUNTER — Other Ambulatory Visit: Payer: Self-pay

## 2018-07-12 NOTE — Patient Outreach (Signed)
[facility]) Care Management  [facility]  07/12/2018  [name] May 23, 1966 [id number]   Reason for call: post discharge medication review  Unsuccessful telephone call attempt # 1 to patient.   HIPAA compliant voicemail left requesting a return call.  Plan:  I will make another outreach attempt to patient within 3-4 business days.  [name], PharmD Clinical Pharmacist [facility] [phone]

## 2018-07-13 ENCOUNTER — Other Ambulatory Visit: Payer: Self-pay

## 2018-07-13 DIAGNOSIS — E785 Hyperlipidemia, unspecified: Secondary | ICD-10-CM | POA: Diagnosis not present

## 2018-07-13 DIAGNOSIS — J449 Chronic obstructive pulmonary disease, unspecified: Secondary | ICD-10-CM | POA: Diagnosis not present

## 2018-07-13 DIAGNOSIS — I5042 Chronic combined systolic (congestive) and diastolic (congestive) heart failure: Secondary | ICD-10-CM | POA: Diagnosis not present

## 2018-07-13 DIAGNOSIS — E876 Hypokalemia: Secondary | ICD-10-CM | POA: Diagnosis not present

## 2018-07-13 DIAGNOSIS — I10 Essential (primary) hypertension: Secondary | ICD-10-CM | POA: Diagnosis not present

## 2018-07-13 NOTE — Patient Outreach (Signed)
[facility]) Care Management  07/13/2018  [name] 30-Dec-1965 [id number]    Attempted outreach with Mr. Mayes. Member reported preparing to leave for an appointment at the time of call. Requested a callback on Friday.  PLAN Will follow up on 07/15/18.   Katha Cabal [facility] Community Care Manager [phone]

## 2018-07-15 ENCOUNTER — Other Ambulatory Visit: Payer: Self-pay

## 2018-07-15 NOTE — Patient Outreach (Signed)
[facility]) Care Management  07/15/2018  [name] [birth year] [id number]    Attempted outreach with [name]. Member was not available. Will send unsuccessful outreach letter and follow up in 3-4 business days.   Katha Cabal [facility] Community Care Manager [phone]

## 2018-07-17 IMAGING — DX DG CHEST 2V
2 series · 2 of 2 positions shown · non-contrast
Comparison: 10/24/2016

CLINICAL DATA: Shortness of breath. Bilateral lower extremity
swelling.

EXAM:
CHEST  2 VIEW

[chest pa]
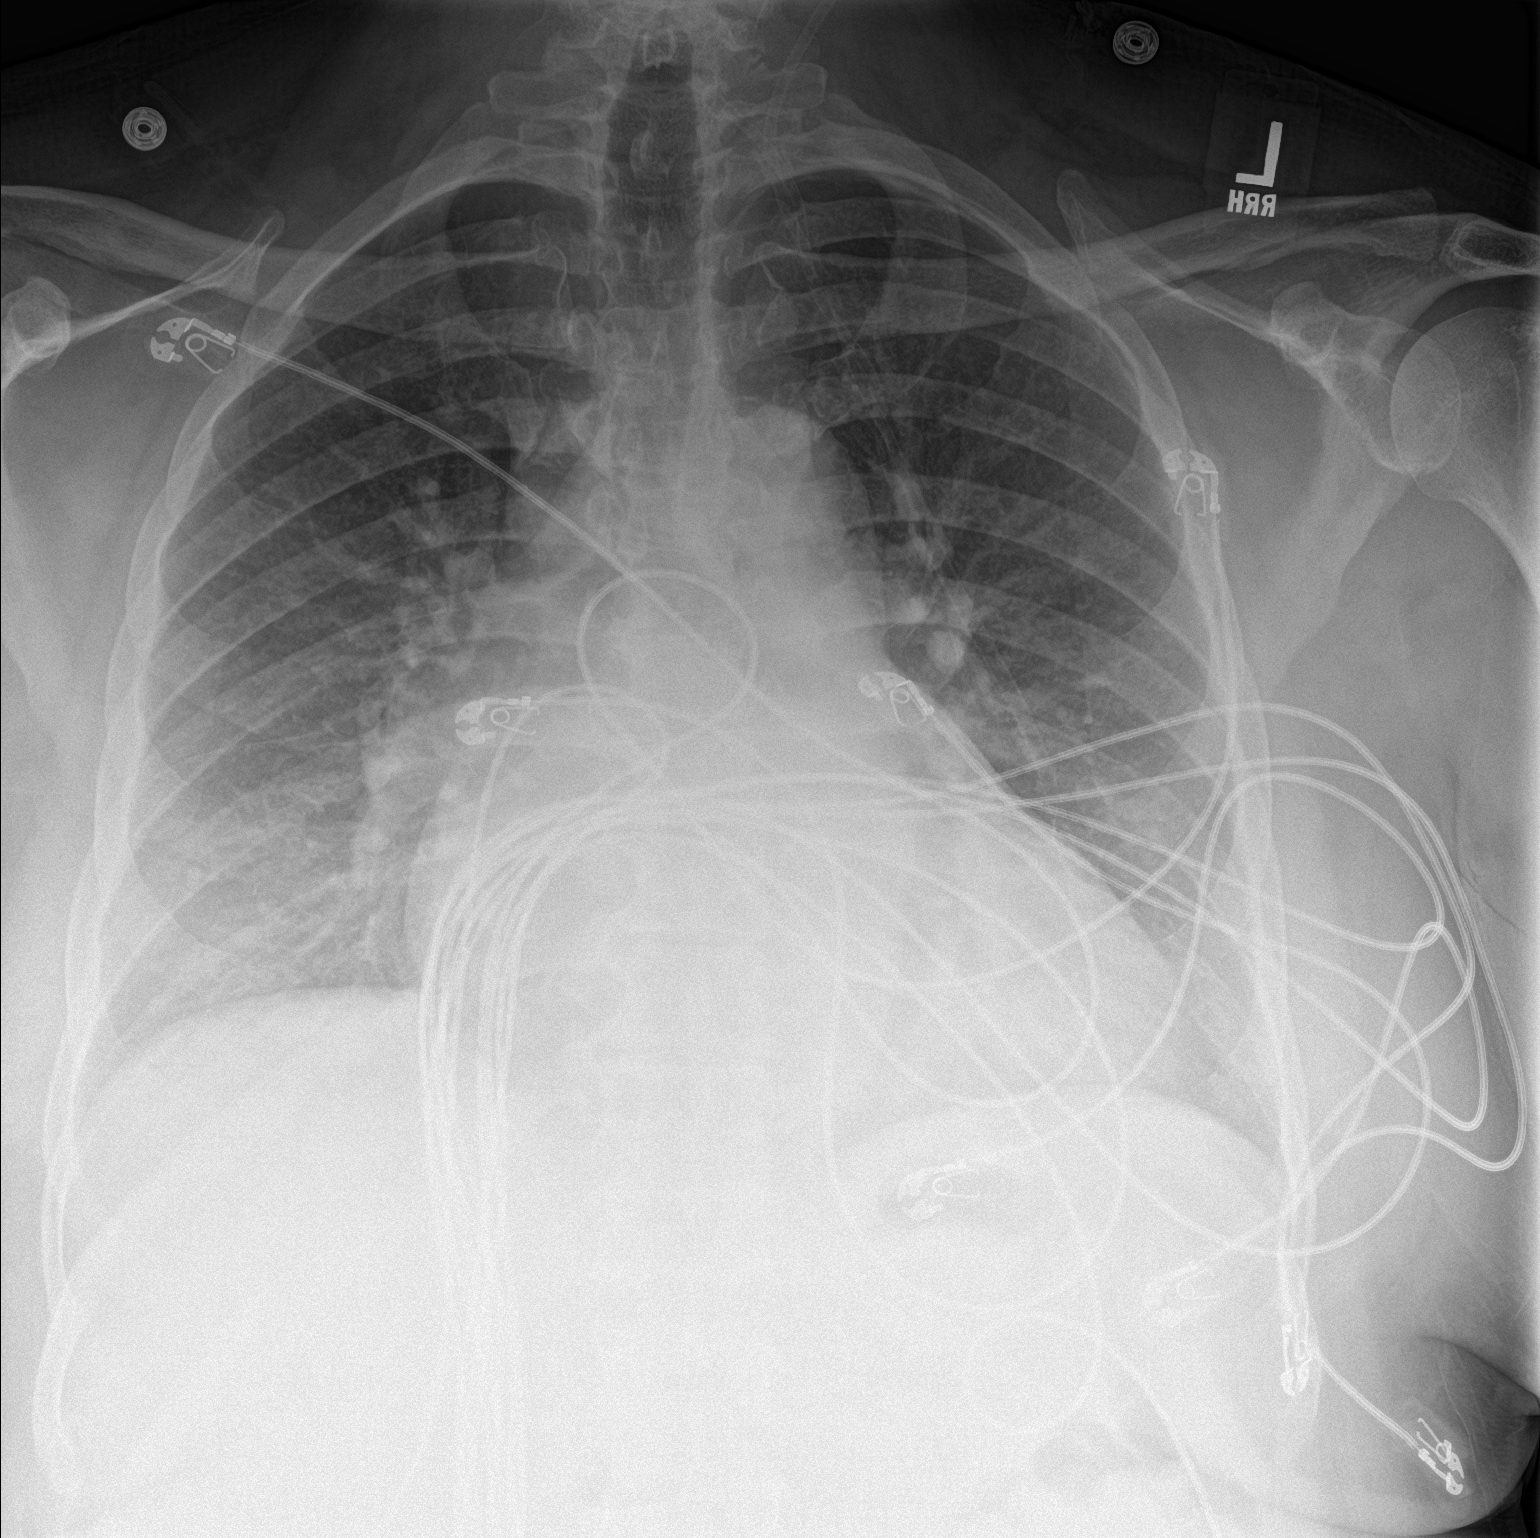

[chest lat]
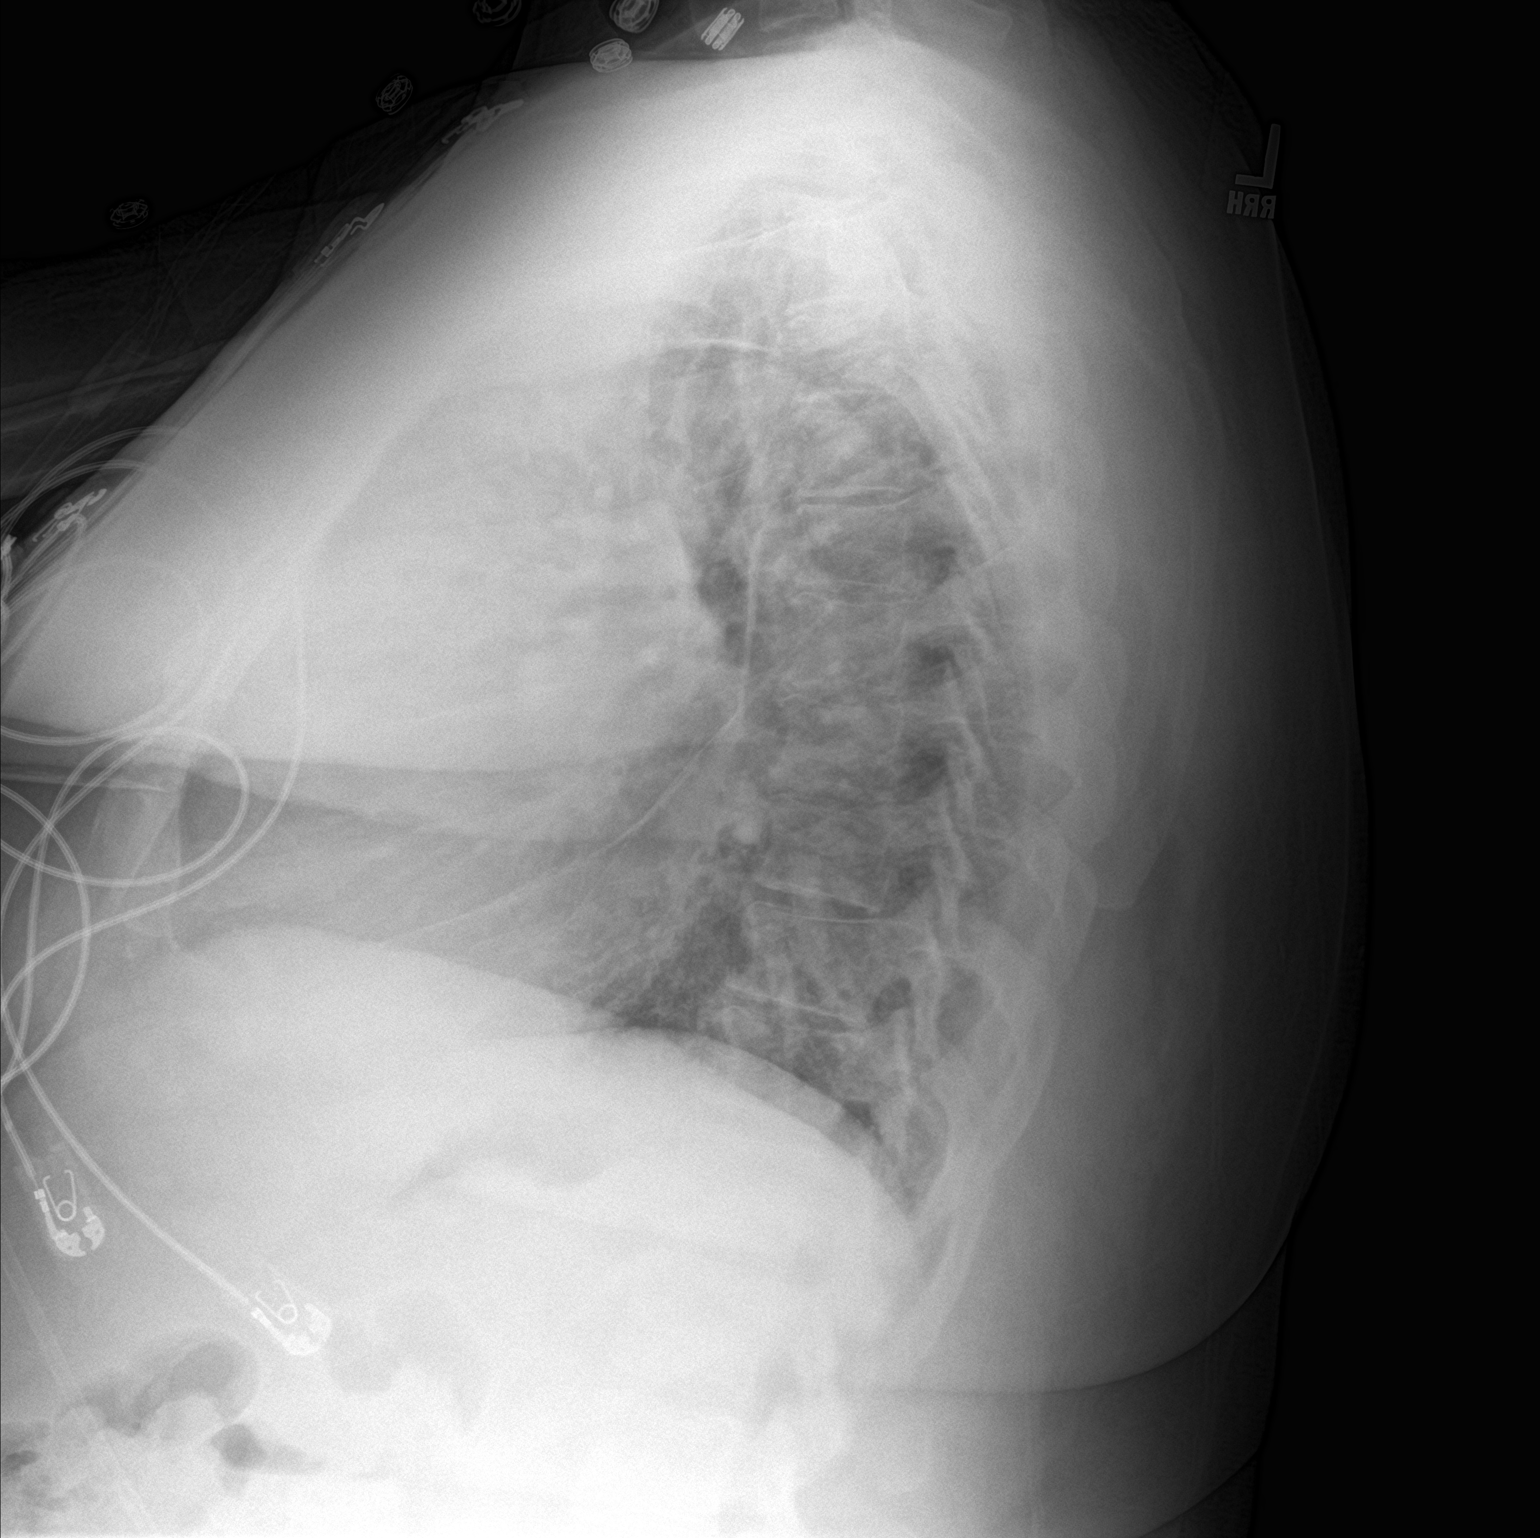

[2 of 2 positions shown; findings below may reference images not displayed]

FINDINGS: There is cardiomegaly with slight pulmonary vascular congestion. No
discrete pulmonary edema or effusions.

The bones are normal.
IMPRESSION: Cardiomegaly with slight pulmonary vascular congestion.

## 2018-07-18 ENCOUNTER — Other Ambulatory Visit: Payer: Self-pay

## 2018-07-18 ENCOUNTER — Telehealth: Payer: Self-pay | Admitting: Cardiovascular Disease

## 2018-07-18 ENCOUNTER — Ambulatory Visit: Payer: Self-pay

## 2018-07-18 NOTE — Telephone Encounter (Signed)
Please call [facility] Nurse @ [phone]

## 2018-07-18 NOTE — Telephone Encounter (Signed)
Please [name] pharmacist w/ [facility] a call @ [phone]  Pt is wanting to go back to the hospital for IV meds to get fluid off  Weight today 275lbs- 2 days ago he weighed 269lbs.

## 2018-07-18 NOTE — Patient Outreach (Addendum)
[facility]) Care Management  [facility]   07/18/2018  [name] Jan 31, 1966 [id number]  Follow up outreach call.   Outreach:  Successful telephone call with Mr. Burki.  HIPAA identifiers verified.   Subjective:  Mr. Heinicke reports that he feels that he is almost at the point of needing to go back to the hospital to "get some fluid off".  He states this weight today is 275 lbs, was 272 lbs yesterday and 269 lbs the day before that.  He reports being compliant with all his medications.  He is adamant that he is not receiving "real medicine because his insurance will only pay for the generics".  He states the only medicine that works for him is the kind he receives while in the hospital.  He reports he was told to follow a low salt diet and to restrict his fluid intake at discharge.  He states he was not given a prn diuretic plan if he notices fluid accumulating.    Objective: Lab Results  Component Value Date   CREATININE 1.18 07/06/2018   CREATININE 1.19 07/05/2018   CREATININE 1.08 07/04/2018    Lab Results  Component Value Date   HGBA1C 5.7 (H) 04/04/2016    Lipid Panel     Component Value Date/Time   CHOL 139 06/14/2015 0328   TRIG 58 06/14/2015 0328   HDL 28 (L) 06/14/2015 0328   CHOLHDL 5.0 06/14/2015 0328   VLDL 12 06/14/2015 0328   LDLCALC 99 06/14/2015 0328    BP Readings from Last 3 Encounters:  07/06/18 123/80  06/13/18 130/86  04/03/18 94/77    Allergies  Allergen Reactions  . Banana Shortness Of Breath  . Bee Venom Shortness Of Breath, Swelling and Other (See Comments)    Reaction:  Facial swelling  . Food Shortness Of Breath, Rash and Other (See Comments)    Pt states that he is allergic to strawberries.    . Aspirin Other (See Comments)    Reaction:  GI upset   . Metolazone Other (See Comments)    Pt states that he stopped taking this med due to heart attack like symptoms.   . Oatmeal Nausea And Vomiting  . Orange  Juice [Orange Oil] Nausea And Vomiting    All acidic products make him nauseous and upset stomach  . Torsemide Swelling and Other (See Comments)    Reaction:  Swelling of feet/legs   . Diltiazem Palpitations  . Hydralazine Palpitations  . Lipitor [Atorvastatin] Other (See Comments)    Reaction:  Nose bleeds    Medications Reviewed Today    Reviewed by Hurley Cisco, [facility] (Pharmacist) on 07/19/18 at 0827  Med List Status: <None>  Medication Order Taking? Sig Documenting Provider Last Dose Status Informant  albuterol (PROVENTIL) (2.5 MG/3ML) 0.083% nebulizer solution [id number] Yes Take 2.5 mg by nebulization every 6 (six) hours as needed for wheezing or shortness of breath. [provider] Taking Active Self           Med Note Allyne Gee, Irven Easterly Mar 04, 2017  8:53 PM)    aspirin 81 MG chewable tablet [id number] Yes Chew 1 tablet (81 mg total) by mouth daily. Erick Blinks, MD Taking Active Self           Med Note (Alyxis Grippi, Lynita Lombard   Fri Jul 01, 2018 12:27 PM)    atorvastatin (LIPITOR) 40 MG tablet [id number] Yes Take 1 tablet (40 mg total) by mouth daily  at 6 PM. Kathlen Mody, MD Taking Active Self  beclomethasone (QVAR) 80 MCG/ACT inhaler [id number] Yes Inhale 2 puffs into the lungs 2 (two) times daily.  [provider] Taking Active Self           Med Note Thomasene Lot Mar 04, 2017  8:54 PM)          Discontinued 07/19/18 0826 (Completed Course)   bumetanide (BUMEX) 2 MG tablet [id number] Yes Take 2 mg by mouth 3 (three) times daily. [provider] Taking Active Self  carvedilol (COREG) 25 MG tablet [id number] Yes Take 1.5 tablets (37.5 mg total) by mouth 2 (two) times daily with a meal.  Patient taking differently:  Take 37.5 mg by mouth 2 (two) times daily with a meal. Patient reports he is only taking 1 tablet twice daily- 07/01/17   [name], MD Taking Active Self  docusate sodium (COLACE) 100 MG capsule [id number] Yes Take 1  capsule (100 mg total) by mouth 2 (two) times daily. Kathlen Mody, MD Taking Active Self  ferrous gluconate (FERGON) 324 MG tablet [id number] Yes Take 1 tablet (324 mg total) by mouth 2 (two) times daily with a meal. Kathlen Mody, MD Taking Active Self  lisinopril (PRINIVIL,ZESTRIL) 5 MG tablet [id number] Yes Take 5 mg by mouth daily. [provider] Taking Active Self  OXYGEN [id number] Yes Inhale 3.5 L into the lungs daily. [provider] Taking Active Self           Med Note Noelle Penner, KIMBERLY L   Fri Sep 17, 2017 11:21 AM)   3-3.5 L prn   pantoprazole (PROTONIX) 40 MG tablet [id number] Yes Take 1 tablet (40 mg total) by mouth daily at 6 (six) AM.  Patient taking differently:  Take 40 mg by mouth every evening.    Kathlen Mody, MD Taking Active Self        Discontinued 09/03/15 [phone] (Entry Error)   potassium chloride SA (K-DUR,KLOR-CON) 20 MEQ tablet [id number] Yes Take 1 tablet (20 mEq total) by mouth 2 (two) times daily. Sherryll Burger, Pratik D, DO Taking Active Self  rivaroxaban (XARELTO) 20 MG TABS tablet [id number] Yes Take 1 tablet (20 mg total) by mouth daily with breakfast.  Patient taking differently:  Take 20 mg by mouth daily with supper.    Kathlen Mody, MD Taking Active Self           Med Note Noelle Penner, Kathreen Cosier   Fri Sep 17, 2017 11:23 AM) Taking in evening     spironolactone (ALDACTONE) 100 MG tablet [id number] Yes Take 100 mg by mouth daily. For fluid [provider] Taking Active Self  tamsulosin (FLOMAX) 0.4 MG CAPS capsule [id number] Yes Take 1 capsule (0.4 mg total) by mouth daily. Kathlen Mody, MD Taking Active Self          ASSESSMENT: Date Discharged from Hospital:  07/06/18 Date Medication Reconciliation Performed: 07/18/2018  Patient was recently discharged from hospital and all medications have been reviewed.  Assessment:  Drugs sorted by system:  Cardiovascular: aspirin 81 mg, atorvastatin, bumetanide, carvedilol, lisinopril, potassium  chloride, rivaroxaban, spironolactone  Pulmonary/Allergy: albuterol neb, beclomethasone  Gastrointestinal: docusate, pantoprazole  Genitourinary: tamsulosin  Vitamins/Minerals/Supplements: ferrous gluconate  Medication Review Findings:  . Carvedilol- patient reports taking 1 tablet (25mg ) twice daily instead of the prescribed amount of 37.5 mg twice daily. . Patient reports being compliant with his regimen.  Counseled that he received generic furosemide in the hospital and that the better diuresis  was due to the increased dose and the route of administration (IV).  The results were not due to receiving brand name medication.   . Of note, patient reports allergies to torsemide (swelling of feet/leg), hydralazine (palpiations) and metolazone (heart attack like symptoms).    Medication Assistance Findings:  No medication assistance needs identified.  Plan: Message left with Dr. Junius ArgyleKoneswaran's office to inform of phone conversation with Mr. Christell Faithrcher about his weight gain.  [facility] RN[facility], Juanell FairlyFelecia McCray about Mr. Christell FaithArcher. She is going to outreach to cardiology as well.  She has an outreach call to Mr. Marchitto scheduled for 07/19/18.    Follow up with patient on 07/20/18  Berlin HunJennifer Andrick Rust, PharmD Clinical Pharmacist [facility] [phone]

## 2018-07-19 ENCOUNTER — Other Ambulatory Visit: Payer: Self-pay

## 2018-07-19 NOTE — Telephone Encounter (Signed)
°  °  °  Please call [facility] Nurse @ [phone]

## 2018-07-19 NOTE — Telephone Encounter (Signed)
Returned call. No answer, left message for pt to return call.  

## 2018-07-19 NOTE — Telephone Encounter (Signed)
Returned call to [facility] no answer, left message for pt to return call.

## 2018-07-19 NOTE — Patient Outreach (Signed)
[facility]) Care Management  07/19/2018  [name] April 29, 1966 [id number]   Unsuccessful outreach attempt. Phone rang several times without option to leave a voice message.  PLAN Will attempt to reach member with 3-4 business days.  Katha Cabal [facility] Community Care Manager [phone]

## 2018-07-20 ENCOUNTER — Other Ambulatory Visit: Payer: Self-pay

## 2018-07-20 NOTE — Patient Outreach (Signed)
[facility]) Care Management  07/20/2018  [name] [birth year] [id number]  Call received from [facility], Cardiology clinic,  regarding pending order for ReDS vest .  Provided fax number and contact information for [facility] Intake Coordinator. Per previous discussion with Intake Coordinator, the team will attempt to complete [name]'s initial assessment on or before 07/23/18.  Attempted to follow-up with [name]. Outreach unsuccessful. Will attempt additional outreach on 07/21/18.  PLAN Will update [facility] Pharmacist. Will follow up with Cardiology team within a week. Will attempt to reach member on 07/21/18.  Katha Cabal [facility] Community Care Manager [phone]

## 2018-07-20 NOTE — Telephone Encounter (Signed)
Returned call to [facility]. She is currently working with pt to try to get some things in place in order to keep him out of the hospital. She stated that he has gained 6 lbs in a 2 day period. He called and advised them that he felt like he was on the verge of having to go to the ED. She asks if she can have a standing order in place as far as the diuretic plan. She also asks if you would allow him to do the [employer] program, that way there is more eyes and ears on the patient in order to try to keep him out of the hospital. Please advise.

## 2018-07-20 NOTE — Telephone Encounter (Signed)
That would be fine. I agree with Sunny Schlein.

## 2018-07-20 NOTE — Patient Outreach (Addendum)
[facility]) Care Management  [facility]  07/20/2018  CHRISTOP RUBEY 09-May-1966 [id number]  Reason for call: Follow-up for care coordination with [employer] Program  Unsuccessful telephone call attempt #1 to patient. Briefly spoke with patient's mother, [name] (DRP on file), who said that patient did not feel like talking on the phone. She reports his feet are "so swollen that he may need to go to the hospital". Mother reports that they only have a land line phone, so she could not take the phone to him. Mother reported that patient is adherent to medications, but "they still don't get the fluid off". Requested that she leave a message with patient to please give [place] a call back.  Informed his mother that he is going to receive a phone call from the [employer] Program to help manage his fluid and encouraged them to answer the telephone.   Plan:  Will communicate with [facility] RN Case Manager, Gae Dry, to give update on patient case.  Katie Milicent Acheampong PharmD Candidate, Class of 2020 [employer]  [name], PharmD Clinical Pharmacist [facility] [phone]

## 2018-07-21 ENCOUNTER — Other Ambulatory Visit: Payer: Self-pay

## 2018-07-21 DIAGNOSIS — K219 Gastro-esophageal reflux disease without esophagitis: Secondary | ICD-10-CM | POA: Diagnosis not present

## 2018-07-21 DIAGNOSIS — Z7982 Long term (current) use of aspirin: Secondary | ICD-10-CM | POA: Diagnosis not present

## 2018-07-21 DIAGNOSIS — I10 Essential (primary) hypertension: Secondary | ICD-10-CM | POA: Diagnosis not present

## 2018-07-21 DIAGNOSIS — R0602 Shortness of breath: Secondary | ICD-10-CM | POA: Diagnosis not present

## 2018-07-21 DIAGNOSIS — Z79899 Other long term (current) drug therapy: Secondary | ICD-10-CM | POA: Diagnosis not present

## 2018-07-21 DIAGNOSIS — R609 Edema, unspecified: Secondary | ICD-10-CM | POA: Diagnosis not present

## 2018-07-21 DIAGNOSIS — I110 Hypertensive heart disease with heart failure: Secondary | ICD-10-CM | POA: Diagnosis not present

## 2018-07-21 DIAGNOSIS — Z87891 Personal history of nicotine dependence: Secondary | ICD-10-CM | POA: Diagnosis not present

## 2018-07-21 DIAGNOSIS — I509 Heart failure, unspecified: Secondary | ICD-10-CM | POA: Diagnosis not present

## 2018-07-21 NOTE — Patient Outreach (Signed)
[facility]) Care Management  07/21/2018  BHUPINDER PLAGENS Jun 27, 1965 [id number]   Attempted outreach with Mr. Maybin. Member's mother reported that he went to the ER.   PLAN Will update [facility] team. Will follow up with inpatient care management team.   Katha Cabal [facility] Community Care Manager [phone]

## 2018-07-22 ENCOUNTER — Inpatient Hospital Stay (HOSPITAL_COMMUNITY)
Admission: EM | Admit: 2018-07-22 | Discharge: 2018-07-25 | DRG: 292 | Disposition: A | Discharge status: Home Health | Payer: Medicare Other | Attending: Family Medicine | Admitting: Family Medicine

## 2018-07-22 ENCOUNTER — Other Ambulatory Visit: Payer: Self-pay

## 2018-07-22 ENCOUNTER — Emergency Department (HOSPITAL_COMMUNITY): Payer: Medicare Other

## 2018-07-22 ENCOUNTER — Encounter (HOSPITAL_COMMUNITY): Payer: Self-pay | Admitting: Emergency Medicine

## 2018-07-22 DIAGNOSIS — R0602 Shortness of breath: Secondary | ICD-10-CM | POA: Diagnosis not present

## 2018-07-22 DIAGNOSIS — K219 Gastro-esophageal reflux disease without esophagitis: Secondary | ICD-10-CM | POA: Diagnosis present

## 2018-07-22 DIAGNOSIS — G4733 Obstructive sleep apnea (adult) (pediatric): Secondary | ICD-10-CM | POA: Diagnosis present

## 2018-07-22 DIAGNOSIS — I252 Old myocardial infarction: Secondary | ICD-10-CM

## 2018-07-22 DIAGNOSIS — I5033 Acute on chronic diastolic (congestive) heart failure: Secondary | ICD-10-CM | POA: Diagnosis not present

## 2018-07-22 DIAGNOSIS — Z79899 Other long term (current) drug therapy: Secondary | ICD-10-CM | POA: Diagnosis not present

## 2018-07-22 DIAGNOSIS — Z9981 Dependence on supplemental oxygen: Secondary | ICD-10-CM

## 2018-07-22 DIAGNOSIS — R778 Other specified abnormalities of plasma proteins: Secondary | ICD-10-CM | POA: Diagnosis present

## 2018-07-22 DIAGNOSIS — I110 Hypertensive heart disease with heart failure: Secondary | ICD-10-CM | POA: Diagnosis not present

## 2018-07-22 DIAGNOSIS — I482 Chronic atrial fibrillation, unspecified: Secondary | ICD-10-CM | POA: Diagnosis not present

## 2018-07-22 DIAGNOSIS — J45909 Unspecified asthma, uncomplicated: Secondary | ICD-10-CM | POA: Diagnosis present

## 2018-07-22 DIAGNOSIS — Z6841 Body Mass Index (BMI) 40.0 and over, adult: Secondary | ICD-10-CM

## 2018-07-22 DIAGNOSIS — Z9111 Patient's noncompliance with dietary regimen: Secondary | ICD-10-CM

## 2018-07-22 DIAGNOSIS — Z7901 Long term (current) use of anticoagulants: Secondary | ICD-10-CM

## 2018-07-22 DIAGNOSIS — I248 Other forms of acute ischemic heart disease: Secondary | ICD-10-CM | POA: Diagnosis present

## 2018-07-22 DIAGNOSIS — R7989 Other specified abnormal findings of blood chemistry: Secondary | ICD-10-CM | POA: Diagnosis not present

## 2018-07-22 DIAGNOSIS — E7849 Other hyperlipidemia: Secondary | ICD-10-CM | POA: Diagnosis not present

## 2018-07-22 DIAGNOSIS — I5021 Acute systolic (congestive) heart failure: Secondary | ICD-10-CM

## 2018-07-22 DIAGNOSIS — E8779 Other fluid overload: Secondary | ICD-10-CM

## 2018-07-22 DIAGNOSIS — I491 Atrial premature depolarization: Secondary | ICD-10-CM | POA: Diagnosis not present

## 2018-07-22 DIAGNOSIS — Z7982 Long term (current) use of aspirin: Secondary | ICD-10-CM | POA: Diagnosis not present

## 2018-07-22 DIAGNOSIS — E876 Hypokalemia: Secondary | ICD-10-CM | POA: Diagnosis present

## 2018-07-22 DIAGNOSIS — I4891 Unspecified atrial fibrillation: Secondary | ICD-10-CM | POA: Diagnosis not present

## 2018-07-22 DIAGNOSIS — D649 Anemia, unspecified: Secondary | ICD-10-CM | POA: Diagnosis not present

## 2018-07-22 DIAGNOSIS — I10 Essential (primary) hypertension: Secondary | ICD-10-CM | POA: Diagnosis present

## 2018-07-22 DIAGNOSIS — I2510 Atherosclerotic heart disease of native coronary artery without angina pectoris: Secondary | ICD-10-CM | POA: Diagnosis not present

## 2018-07-22 DIAGNOSIS — Z7951 Long term (current) use of inhaled steroids: Secondary | ICD-10-CM

## 2018-07-22 DIAGNOSIS — R079 Chest pain, unspecified: Secondary | ICD-10-CM | POA: Diagnosis not present

## 2018-07-22 DIAGNOSIS — E785 Hyperlipidemia, unspecified: Secondary | ICD-10-CM | POA: Diagnosis not present

## 2018-07-22 DIAGNOSIS — I428 Other cardiomyopathies: Secondary | ICD-10-CM | POA: Diagnosis present

## 2018-07-22 DIAGNOSIS — I5043 Acute on chronic combined systolic (congestive) and diastolic (congestive) heart failure: Secondary | ICD-10-CM | POA: Diagnosis present

## 2018-07-22 DIAGNOSIS — E66813 Obesity, class 3: Secondary | ICD-10-CM | POA: Diagnosis present

## 2018-07-22 DIAGNOSIS — I509 Heart failure, unspecified: Secondary | ICD-10-CM | POA: Diagnosis not present

## 2018-07-22 DIAGNOSIS — E8770 Fluid overload, unspecified: Secondary | ICD-10-CM | POA: Diagnosis present

## 2018-07-22 DIAGNOSIS — Z87891 Personal history of nicotine dependence: Secondary | ICD-10-CM

## 2018-07-22 DIAGNOSIS — I499 Cardiac arrhythmia, unspecified: Secondary | ICD-10-CM | POA: Diagnosis not present

## 2018-07-22 DIAGNOSIS — R0689 Other abnormalities of breathing: Secondary | ICD-10-CM | POA: Diagnosis not present

## 2018-07-22 DIAGNOSIS — Z9114 Patient's other noncompliance with medication regimen: Secondary | ICD-10-CM

## 2018-07-22 LAB — COMPREHENSIVE METABOLIC PANEL
ALT: 14 U/L (ref 0–44)
AST: 18 U/L (ref 15–41)
Albumin: 3.6 g/dL (ref 3.5–5.0)
Alkaline Phosphatase: 65 U/L (ref 38–126)
Anion gap: 7 (ref 5–15)
BUN: 13 mg/dL (ref 6–20)
CO2: 28 mmol/L (ref 22–32)
Calcium: 8.4 mg/dL — ABNORMAL LOW (ref 8.9–10.3)
Chloride: 104 mmol/L (ref 98–111)
Creatinine, Ser: 1.11 mg/dL (ref 0.61–1.24)
GFR calc Af Amer: >60 mL/min (ref >60)
Glucose, Bld: 92 mg/dL (ref 70–99)
Potassium: 3.4 mmol/L — ABNORMAL LOW (ref 3.5–5.1)
Sodium: 139 mmol/L (ref 135–145)
Total Bilirubin: 1 mg/dL (ref 0.3–1.2)
Total Protein: 6.6 g/dL (ref 6.5–8.1)

## 2018-07-22 LAB — PHOSPHORUS: Phosphorus: 3.8 mg/dL (ref 2.5–4.6)

## 2018-07-22 LAB — CBC WITH DIFFERENTIAL/PLATELET
Abs Immature Granulocytes: 0.01 10*3/uL (ref 0.00–0.07)
Basophils Absolute: 0 10*3/uL (ref 0.0–0.1)
Basophils Relative: 0 %
EOS ABS: 0.1 10*3/uL (ref 0.0–0.5)
Eosinophils Relative: 2 %
HCT: 40.3 % (ref 39.0–52.0)
Hemoglobin: 12.5 g/dL — ABNORMAL LOW (ref 13.0–17.0)
Immature Granulocytes: 0 %
Lymphocytes Relative: 22 %
Lymphs Abs: 0.9 10*3/uL (ref 0.7–4.0)
MCH: 27.6 pg (ref 26.0–34.0)
MCHC: 31 g/dL (ref 30.0–36.0)
MCV: 89 fL (ref 80.0–100.0)
Monocytes Absolute: 0.6 10*3/uL (ref 0.1–1.0)
Monocytes Relative: 14 %
Neutro Abs: 2.6 10*3/uL (ref 1.7–7.7)
Neutrophils Relative %: 62 %
Platelets: 191 10*3/uL (ref 150–400)
RBC: 4.53 MIL/uL (ref 4.22–5.81)
RDW: 14.2 % (ref 11.5–15.5)
WBC: 4.2 10*3/uL (ref 4.0–10.5)
nRBC: 0 % (ref 0.0–0.2)

## 2018-07-22 LAB — MAGNESIUM: Magnesium: 2.1 mg/dL (ref 1.7–2.4)

## 2018-07-22 LAB — TROPONIN I
TROPONIN I: 0.03 ng/mL — AB (ref <0.03)
Troponin I: 0.04 ng/mL (ref <0.03)

## 2018-07-22 LAB — BRAIN NATRIURETIC PEPTIDE: B Natriuretic Peptide: 166 pg/mL — ABNORMAL HIGH (ref 0.0–100.0)

## 2018-07-22 MED ORDER — DOCUSATE SODIUM 100 MG PO CAPS
100.0000 mg | ORAL_CAPSULE | Freq: Two times a day (BID) | ORAL | Status: DC
  Administered 2018-07-23 – 2018-07-25 (×5): 100 mg via ORAL
  Filled 2018-07-22 (×5): qty 1

## 2018-07-22 MED ORDER — RIVAROXABAN 20 MG PO TABS
20.0000 mg | ORAL_TABLET | Freq: Every day | ORAL | Status: DC
  Administered 2018-07-23 – 2018-07-24 (×2): 20 mg via ORAL
  Filled 2018-07-22 (×2): qty 1

## 2018-07-22 MED ORDER — CARVEDILOL 12.5 MG PO TABS
37.5000 mg | ORAL_TABLET | Freq: Two times a day (BID) | ORAL | Status: DC
  Administered 2018-07-23 – 2018-07-25 (×5): 37.5 mg via ORAL
  Filled 2018-07-22 (×6): qty 3

## 2018-07-22 MED ORDER — ALBUTEROL SULFATE (2.5 MG/3ML) 0.083% IN NEBU: 2.5000 mg | INHALATION_SOLUTION | Freq: Four times a day (QID) | RESPIRATORY_TRACT | Status: DC | PRN

## 2018-07-22 MED ORDER — FUROSEMIDE 10 MG/ML IJ SOLN
80.0000 mg | Freq: Two times a day (BID) | INTRAMUSCULAR | Status: DC
  Administered 2018-07-23 – 2018-07-25 (×5): 80 mg via INTRAVENOUS
  Filled 2018-07-22 (×5): qty 8

## 2018-07-22 MED ORDER — ATORVASTATIN CALCIUM 40 MG PO TABS
40.0000 mg | ORAL_TABLET | Freq: Every day | ORAL | Status: DC
  Administered 2018-07-23 – 2018-07-24 (×2): 40 mg via ORAL
  Filled 2018-07-22 (×2): qty 1

## 2018-07-22 MED ORDER — FUROSEMIDE 10 MG/ML IJ SOLN
80.0000 mg | Freq: Once | INTRAMUSCULAR | Status: AC
  Administered 2018-07-22: 80 mg via INTRAVENOUS
  Filled 2018-07-22: qty 8

## 2018-07-22 MED ORDER — PANTOPRAZOLE SODIUM 40 MG PO TBEC
40.0000 mg | DELAYED_RELEASE_TABLET | Freq: Every evening | ORAL | Status: DC
  Administered 2018-07-23 – 2018-07-24 (×2): 40 mg via ORAL
  Filled 2018-07-22 (×2): qty 1

## 2018-07-22 MED ORDER — TAMSULOSIN HCL 0.4 MG PO CAPS
0.4000 mg | ORAL_CAPSULE | Freq: Every day | ORAL | Status: DC
  Administered 2018-07-23 – 2018-07-25 (×3): 0.4 mg via ORAL
  Filled 2018-07-22 (×3): qty 1

## 2018-07-22 MED ORDER — POTASSIUM CHLORIDE CRYS ER 20 MEQ PO TBCR
20.0000 meq | EXTENDED_RELEASE_TABLET | Freq: Two times a day (BID) | ORAL | Status: DC
  Administered 2018-07-23 – 2018-07-25 (×5): 20 meq via ORAL
  Filled 2018-07-22 (×6): qty 1

## 2018-07-22 MED ORDER — ACETAMINOPHEN 650 MG RE SUPP: 650.0000 mg | Freq: Four times a day (QID) | RECTAL | Status: DC | PRN

## 2018-07-22 MED ORDER — ASPIRIN 81 MG PO CHEW
81.0000 mg | CHEWABLE_TABLET | Freq: Every day | ORAL | Status: DC
  Administered 2018-07-23 – 2018-07-25 (×3): 81 mg via ORAL
  Filled 2018-07-22 (×3): qty 1

## 2018-07-22 MED ORDER — POTASSIUM CHLORIDE CRYS ER 20 MEQ PO TBCR
40.0000 meq | EXTENDED_RELEASE_TABLET | Freq: Once | ORAL | Status: AC
  Administered 2018-07-22: 40 meq via ORAL
  Filled 2018-07-22: qty 2

## 2018-07-22 MED ORDER — SPIRONOLACTONE 25 MG PO TABS
100.0000 mg | ORAL_TABLET | Freq: Every day | ORAL | Status: DC
  Administered 2018-07-23 – 2018-07-25 (×3): 100 mg via ORAL
  Filled 2018-07-22 (×3): qty 4

## 2018-07-22 MED ORDER — ACETAMINOPHEN 325 MG PO TABS: 650.0000 mg | ORAL_TABLET | Freq: Four times a day (QID) | ORAL | Status: DC | PRN

## 2018-07-22 MED ORDER — LISINOPRIL 5 MG PO TABS
5.0000 mg | ORAL_TABLET | Freq: Every day | ORAL | Status: DC
  Administered 2018-07-22 – 2018-07-25 (×4): 5 mg via ORAL
  Filled 2018-07-22 (×4): qty 1

## 2018-07-22 NOTE — H&P (Signed)
History and Physical    [name] MWN:[id number] DOB: May 09, 1966 DOA: 07/22/2018  PCP: Pearson Grippe, MD   Patient coming from: Home.  I have personally briefly reviewed patient's old medical records in [facility]  Chief Complaint: Shortness of breath.  HPI: [name] is a 53 y.o. male with medical history significant of allergic rhinitis, asthma, atrial fibrillation, chronic systolic CHF, hypertension, GERD, CAD, history of MI, with normal coronaries in December 2016, treatment noncompliance, OSA, osteoarthritis, PUD who is coming to the emergency department with complaints of progressively worse dyspnea for the past 2 to 3 days associated with orthopnea, lower extremity edema and a 12 to 13 pound gain over the last 7 days.  He has been sleeping on a recliner.  He denies fever, chills, sore throat, chest pain, dizziness, diaphoresis, abdominal pain, nausea or emesis, diarrhea or constipation, melena or hematochezia.  No dysuria, frequency or hematuria.  Denies polyuria, polydipsia, polyphagia or blurred vision.  ED Course: Initial temperature was 98.3 F, pulse 104, respirations 28, blood pressure 150/90 mmHg and O2 sat 96% on room air.  He received furosemide 80 mg IVP x1 and K-Dur 40 mEq p.o. x1.  CBC shows a hemoglobin of 12.5 g/dL, all other values are within normal limits.  CMP shows a potassium of 3.4 and calcium of 8.4 mg/dL.  All other values within normal limits.  Troponin 0 0.04 ng/mL.  BNP 166.0 pg/mL.  Magnesium was 2.1 and phosphorus 3.8 mg/dL.  Review of Systems: As per HPI otherwise 10 point review of systems negative.   Past Medical History:  Diagnosis Date  . Allergic rhinitis   . Asthma   . Atrial fibrillation (HCC)   . CHF (congestive heart failure) (HCC)   . Essential hypertension   . Gastroesophageal reflux disease   . Heart attack (HCC)   . History of cardiac catheterization    Normal coronaries December 2016  . Noncompliance    Major problem leading  to declining health and recurrent hospitalization  . Nonischemic cardiomyopathy (HCC)    LVEF 20-25%  . On home O2    3L N/C  . OSA (obstructive sleep apnea)   . Osteoarthritis   . Peptic ulcer disease     Past Surgical History:  Procedure Laterality Date  . CARDIAC CATHETERIZATION N/A 06/14/2015   Procedure: Right/Left Heart Cath and Coronary Angiography;  Surgeon: Runell Gess, MD;  Location: [facility] INVASIVE CV LAB;  Service: Cardiovascular;  Laterality: N/A;     reports that he quit smoking about 11 years ago. His smoking use included cigarettes. He started smoking about 30 years ago. He has a 10.00 pack-year smoking history. He has never used smokeless tobacco. He reports that he does not drink alcohol or use drugs.  Allergies  Allergen Reactions  . Banana Shortness Of Breath  . Bee Venom Shortness Of Breath, Swelling and Other (See Comments)    Reaction:  Facial swelling  . Food Shortness Of Breath, Rash and Other (See Comments)    Pt states that he is allergic to strawberries.    . Aspirin Other (See Comments)    Reaction:  GI upset   . Metolazone Other (See Comments)    Pt states that he stopped taking this med due to heart attack like symptoms.   . Oatmeal Nausea And Vomiting  . Orange Juice [Orange Oil] Nausea And Vomiting    All acidic products make him nauseous and upset stomach  . Torsemide Swelling and Other (  See Comments)    Reaction:  Swelling of feet/legs   . Diltiazem Palpitations  . Hydralazine Palpitations  . Lipitor [Atorvastatin] Other (See Comments)    Reaction:  Nose bleeds     Family History  Problem Relation Age of Onset  . Stroke Father   . Heart attack Father   . Aneurysm Mother        Cerebral aneurysm  . Hypertension Sister   . Colon cancer Neg Hx   . Inflammatory bowel disease Neg Hx   . Liver disease Neg Hx    Prior to Admission medications   Medication Sig Start Date End Date Taking? Authorizing Provider  albuterol (PROVENTIL) (2.5  MG/3ML) 0.083% nebulizer solution Take 2.5 mg by nebulization every 6 (six) hours as needed for wheezing or shortness of breath.   Yes [provider]  aspirin 81 MG chewable tablet Chew 1 tablet (81 mg total) by mouth daily. 04/13/17  Yes Erick Blinks, MD  atorvastatin (LIPITOR) 40 MG tablet Take 1 tablet (40 mg total) by mouth daily at 6 PM. 06/25/16  Yes Kathlen Mody, MD  bumetanide (BUMEX) 2 MG tablet Take 2 mg by mouth 3 (three) times daily.   Yes [provider]  carvedilol (COREG) 25 MG tablet Take 1.5 tablets (37.5 mg total) by mouth 2 (two) times daily with a meal. Patient taking differently: Take 37.5 mg by mouth 2 (two) times daily with a meal. Patient reports he is only taking 1 tablet twice daily- 07/01/17 10/07/17  Yes [name], MD  docusate sodium (COLACE) 100 MG capsule Take 1 capsule (100 mg total) by mouth 2 (two) times daily. 06/25/16  Yes Kathlen Mody, MD  ferrous gluconate (FERGON) 324 MG tablet Take 1 tablet (324 mg total) by mouth 2 (two) times daily with a meal. 06/25/16  Yes Kathlen Mody, MD  fluticasone (FLOVENT HFA) 220 MCG/ACT inhaler Inhale 2 puffs into the lungs 2 (two) times daily.   Yes [provider]  indomethacin (INDOCIN) 25 MG capsule Take 25 mg by mouth 3 (three) times daily with meals. Take one capsule by mouth three times a day for acute gout. Stop as soon as gout attack is over.   Yes [provider]  OXYGEN Inhale 3.5 L into the lungs daily.   Yes [provider]  pantoprazole (PROTONIX) 40 MG tablet Take 1 tablet (40 mg total) by mouth daily at 6 (six) AM. Patient taking differently: Take 40 mg by mouth every evening.  06/26/16  Yes Kathlen Mody, MD  potassium chloride SA (K-DUR,KLOR-CON) 20 MEQ tablet Take 1 tablet (20 mEq total) by mouth 2 (two) times daily. 06/13/18  Yes Sherryll Burger, Pratik D, DO  rivaroxaban (XARELTO) 20 MG TABS tablet Take 1 tablet (20 mg total) by mouth daily with breakfast. Patient taking  differently: Take 20 mg by mouth daily with supper.  06/26/16  Yes Kathlen Mody, MD  spironolactone (ALDACTONE) 100 MG tablet Take 100 mg by mouth daily. For fluid 03/18/17  Yes [provider]  tamsulosin (FLOMAX) 0.4 MG CAPS capsule Take 1 capsule (0.4 mg total) by mouth daily. 06/25/16  Yes Kathlen Mody, MD  potassium chloride 20 MEQ TBCR Take 20 mEq by mouth 2 (two) times daily. 09/03/15 09/03/15  Ward, Layla Maw, DO    Physical Exam: Vitals:   07/22/18 1930 07/22/18 1930 07/22/18 2000 07/22/18 2017  BP: (!) 159/103 (!) 151/101 (!) 165/131 (!) 171/92  Pulse: 87 91 88 91  Resp: (!) 21 (!)  21 13 20   Temp:      TempSrc:      SpO2: 100% 98% 100% 99%  Weight:      Height:        Constitutional: NAD, calm, comfortable Eyes: PERRL, lids and conjunctivae normal ENMT: Mucous membranes are moist. Posterior pharynx clear of any exudate or lesions.  Neck: normal, supple, no masses, no thyromegaly Respiratory: Decreased breath sounds on bases with bibasilar rales, no wheezing. Normal respiratory effort. No accessory muscle use.  Cardiovascular: Irregularly irregular, no murmurs / rubs / gallops.  Stage III lymphedema.  2+ lower extremities pitting edema. 2+ pedal pulses. No carotid bruits.  Abdomen: Obese, soft, no tenderness, no masses palpated. No hepatosplenomegaly. Bowel sounds positive.  Musculoskeletal: no clubbing / cyanosis. No joint deformity upper and lower extremities. Good ROM, no contractures. Normal muscle tone.  Skin: A skin on the ankles is showing scales and hyperkeratosis consistent with elephantiasis-like changes. Neurologic: CN 2-12 grossly intact. Sensation intact, DTR normal. Strength 5/5 in all 4.  Psychiatric: Normal judgment and insight. Alert and oriented x 4. Normal mood.   Labs on Admission: I have personally reviewed following labs and imaging studies  CBC: Recent Labs  Lab 07/22/18 1646  WBC 4.2  NEUTROABS 2.6  HGB 12.5*  HCT 40.3  MCV 89.0  PLT 191     Basic Metabolic Panel: Recent Labs  Lab 07/22/18 1646  NA 139  K 3.4*  CL 104  CO2 28  GLUCOSE 92  BUN 13  CREATININE 1.11  CALCIUM 8.4*   GFR: Estimated Creatinine Clearance: 129.1 mL/min (by C-G formula based on SCr of 1.11 mg/dL). Liver Function Tests: Recent Labs  Lab 07/22/18 1646  AST 18  ALT 14  ALKPHOS 65  BILITOT 1.0  PROT 6.6  ALBUMIN 3.6   No results for input(s): LIPASE, AMYLASE in the last 168 hours. No results for input(s): AMMONIA in the last 168 hours. Coagulation Profile: No results for input(s): INR, PROTIME in the last 168 hours. Cardiac Enzymes: Recent Labs  Lab 07/22/18 1646  TROPONINI 0.04*   BNP (last 3 results) No results for input(s): PROBNP in the last 8760 hours. HbA1C: No results for input(s): HGBA1C in the last 72 hours. CBG: No results for input(s): GLUCAP in the last 168 hours. Lipid Profile: No results for input(s): CHOL, HDL, LDLCALC, TRIG, CHOLHDL, LDLDIRECT in the last 72 hours. Thyroid Function Tests: No results for input(s): TSH, T4TOTAL, FREET4, T3FREE, THYROIDAB in the last 72 hours. Anemia Panel: No results for input(s): VITAMINB12, FOLATE, FERRITIN, TIBC, IRON, RETICCTPCT in the last 72 hours. Urine analysis:    Component Value Date/Time   COLORURINE YELLOW 04/01/2018 0332   APPEARANCEUR CLEAR 04/01/2018 0332   LABSPEC 1.008 04/01/2018 0332   PHURINE 7.0 04/01/2018 0332   GLUCOSEU NEGATIVE 04/01/2018 0332   GLUCOSEU NEG mg/dL 27/11/2374 2831   HGBUR NEGATIVE 04/01/2018 0332   BILIRUBINUR NEGATIVE 04/01/2018 0332   KETONESUR NEGATIVE 04/01/2018 0332   PROTEINUR NEGATIVE 04/01/2018 0332   UROBILINOGEN 0.2 08/08/2014 1445   NITRITE NEGATIVE 04/01/2018 0332   LEUKOCYTESUR NEGATIVE 04/01/2018 0332    Radiological Exams on Admission: Dg Chest 2 View  Result Date: 07/22/2018 CLINICAL DATA:  Shortness of breath for 2 days. EXAM: CHEST - 2 VIEW COMPARISON:  PA and lateral chest 07/21/2018 and 05/11/2017.  Single-view of the chest 07/02/2018 and 09/03/2017 FINDINGS: Cardiomegaly and chronic vascular congestion are again seen. No consolidative process, pneumothorax or effusion. No acute or focal bony abnormality. IMPRESSION:  Cardiomegaly and pulmonary vascular congestion. Electronically Signed   By: Drusilla Kannerhomas  Dalessio M.D.   On: 07/22/2018 16:49   Echo 04/01/2018 ------------------------------------------------------------------- LV EF: 55%  ------------------------------------------------------------------- Indications:      CHF - 428.0.  ------------------------------------------------------------------- History:   PMH:  Acquired from the patient and from the patient&'s chart.  Atrial fibrillation.  PMH:  NSVT (nonsustained ventricular tachycardia) Chronic anticoagulation-Xarelto. Nonischemic cardiomyopathy OSA (obstructive sleep apnea), Morbid Obesity.  Risk factors:  Hypertension. Dyslipidemia.  ------------------------------------------------------------------- Study Conclusions  - Left ventricle: The cavity size was normal. Wall thickness was   increased in a pattern of moderate LVH. The estimated ejection   fraction was 55%. Wall motion was normal; there were no regional   wall motion abnormalities. The study was not technically   sufficient to allow evaluation of LV diastolic dysfunction due to   atrial fibrillation. - Aortic valve: Mildly calcified annulus. Trileaflet. - Mitral valve: Mildly thickened leaflets. There was trivial   regurgitation. - Left atrium: The atrium was moderately to severely dilated. - Right atrium: Central venous pressure (est): 3 mm Hg. - Tricuspid valve: There was trivial regurgitation. - Pulmonary arteries: Systolic pressure could not be accurately   estimated. - Pericardium, extracardiac: A trivial pericardial effusion was   identified.  EKG: Independently reviewed. Vent. rate 99 BPM PR interval * ms QRS duration 96 ms QT/QTc 358/460  ms P-R-T axes * 57 164 Atrial fibrillation Abnormal T, consider ischemia, lateral leads  Assessment/Plan Principal Problem:   Volume overload   Acute on chronic combined systolic and diastolic heart failure (HCC) Observation/telemetry. Continue supplemental oxygen. Furosemide 80 mg IVP twice daily. Potassium supplementation. Monitor intake and output. Daily weights. Trend troponin levels.  Active Problems:   Hyperlipidemia Continue atorvastatin 40 mg p.o. daily. Check LFTs as needed.    Essential hypertension Continue carvedilol 37.5 mg p.o. twice daily. Restart lisinopril 5 mg p.o. daily.     GERD (gastroesophageal reflux disease) Pantoprazole 40 mg p.o. daily.    Hypokalemia Replacing. Follow-up potassium level.    Atrial fibrillation, chronic CHA?DS?-VASc Score of at least 3. Continue Xarelto. Continue carvedilol 37.5 mg p.o. twice daily.    Normocytic anemia Monitor hematocrit and hemoglobin.    Morbid obesity due to excess calories (HCC) Needs significant lifestyle modification to avoid further medical conditions worsening    Elevated troponin Trend troponin level.     DVT prophylaxis: On Xarelto. Code Status: Full code. Family Communication: Disposition Plan: Observation for volume overload treatment. Consults called:  Admission status: Observation/telemetry.   Bobette Moavid Manuel Janey Petron MD [facility]  07/22/2018, 9:36 PM   This document was prepared using Dragon voice recognition software and may contain some unintended transcription errors.

## 2018-07-22 NOTE — ED Notes (Signed)
Pt called out stating that he needs to be put on "cpap" machine, that he has been feeling sob, RN asked pt when did the sob become worse, pt stated " all day" Dr Rubin Payor notified, orders given,

## 2018-07-22 NOTE — ED Provider Notes (Addendum)
[facility] EMERGENCY DEPARTMENT Provider Note   CSN: [id number] Arrival date & time: 07/22/18  1511     History   Chief Complaint Chief Complaint  Patient presents with  . Shortness of Breath    HPI [name] is a 53 y.o. male.  HPI Patient presents with shortness of breath.  Worse over the last 2 to 3 days.  Got seen yesterday at [facility] and states that he was given Lasix there.  States is gotten some fluid off and but his legs gone back up again.  States he never is able to sleep flat.  States he is feeling worsening shortness of breath.  Will have some chest tightness at time.  History of CHF and A. fib.  Appears of his ejection fraction is 20 to 25%.  States his weight is about377 pounds.  States that like him around 360.  Weight today in the hospital was 375.  On discharge a couple weeks ago weight was 365. Past Medical History:  Diagnosis Date  . Allergic rhinitis   . Asthma   . Atrial fibrillation (HCC)   . CHF (congestive heart failure) (HCC)   . Essential hypertension   . Gastroesophageal reflux disease   . Heart attack (HCC)   . History of cardiac catheterization    Normal coronaries December 2016  . Noncompliance    Major problem leading to declining health and recurrent hospitalization  . Nonischemic cardiomyopathy (HCC)    LVEF 20-25%  . On home O2    3L N/C  . OSA (obstructive sleep apnea)   . Osteoarthritis   . Peptic ulcer disease     Patient Active Problem List   Diagnosis Date Noted  . Acute on chronic combined systolic and diastolic congestive heart failure (HCC) 08/15/2018  . SOB (shortness of breath) 08/03/2018  . Volume overload 07/22/2018  . Acute systolic CHF (congestive heart failure) (HCC) 07/04/2018  . Acute on chronic combined systolic and diastolic heart failure (HCC) 04/01/2018  . Hypomagnesemia 04/01/2018  . Acute on chronic diastolic CHF (congestive heart failure) (HCC) 03/11/2018  . Pressure injury of skin 03/11/2018  . CHF  (congestive heart failure) (HCC) 09/08/2017  . Altered mental status 05/11/2017  . Acute exacerbation of CHF (congestive heart failure) (HCC) 04/20/2017  . Acute on chronic systolic (congestive) heart failure (HCC) 04/19/2017  . CHF exacerbation (HCC) 04/11/2017  . Chronic respiratory failure with hypoxia (HCC) 04/11/2017  . Encounter for hospice care discussion   . Palliative care encounter   . Goals of care, counseling/discussion   . DNR (do not resuscitate)   . DNR (do not resuscitate) discussion   . Chronic congestive heart failure (HCC) 02/18/2017  . Acute systolic heart failure (HCC) 01/18/2017  . Pedal edema 12/02/2016  . Acute CHF (congestive heart failure) (HCC) 11/09/2016  . Acute on chronic systolic CHF (congestive heart failure) (HCC) 08/19/2016  . CKD (chronic kidney disease), stage II 05/17/2016  . Coronary arteries, normal Dec 2016 01/23/2016  . Chronic anticoagulation-Xarelto 01/23/2016  . NSVT (nonsustained ventricular tachycardia) (HCC) 01/23/2016  . Elevated troponin 12/21/2015  . Nonischemic cardiomyopathy (HCC)   . Morbid obesity due to excess calories (HCC)   . Noncompliance with diet and medication regimen 12/02/2015  . OSA (obstructive sleep apnea) 09/20/2015  . Pulmonary hypertension (HCC) 09/19/2015  . Normocytic anemia 09/19/2015  . Atrial fibrillation, chronic   . Dyspnea on exertion 05/28/2015  . Acute respiratory failure with hypoxia (HCC) 04/01/2015  . Hypokalemia 04/01/2015  .  Obesity hypoventilation syndrome (HCC) 08/08/2014  . GERD (gastroesophageal reflux disease) 08/08/2014  . Edema of right lower extremity 06/21/2014  . Fecal urgency 06/21/2014  . Asthma 02/11/2009  . Hyperlipidemia 07/12/2008  . OBESITY, MORBID 07/12/2008  . Anxiety state 07/12/2008  . DEPRESSION 07/12/2008  . Essential hypertension 07/12/2008  . ALLERGIC RHINITIS 07/12/2008  . Peptic ulcer 07/12/2008  . OSTEOARTHRITIS 07/12/2008    Past Surgical History:  Procedure  Laterality Date  . CARDIAC CATHETERIZATION N/A 06/14/2015   Procedure: Right/Left Heart Cath and Coronary Angiography;  Surgeon: Runell GessJonathan J Berry, MD;  [facility];  Service: Cardiovascular;  Laterality: N/A;        Home Medications    Prior to Admission medications   Medication Sig Start Date End Date Taking? Authorizing Provider  albuterol (PROVENTIL) (2.5 MG/3ML) 0.083% nebulizer solution Take 2.5 mg by nebulization every 6 (six) hours as needed for wheezing or shortness of breath.   Yes [provider]  aspirin 81 MG chewable tablet Chew 1 tablet (81 mg total) by mouth daily. 04/13/17  Yes Erick BlinksMemon, Jehanzeb, MD  atorvastatin (LIPITOR) 40 MG tablet Take 1 tablet (40 mg total) by mouth daily at 6 PM. 06/25/16  Yes Kathlen ModyAkula, Vijaya, MD  bumetanide (BUMEX) 2 MG tablet Take 2 mg by mouth 3 (three) times daily.   Yes [provider]  carvedilol (COREG) 25 MG tablet Take 1.5 tablets (37.5 mg total) by mouth 2 (two) times daily with a meal. Patient taking differently: Take 37.5 mg by mouth 2 (two) times daily with a meal. Patient reports he is only taking 1 tablet twice daily- 07/01/17 10/07/17  Yes Laqueta LindenKoneswaran, Suresh A, MD  docusate sodium (COLACE) 100 MG capsule Take 1 capsule (100 mg total) by mouth 2 (two) times daily. 06/25/16  Yes Kathlen ModyAkula, Vijaya, MD  ferrous gluconate (FERGON) 324 MG tablet Take 1 tablet (324 mg total) by mouth 2 (two) times daily with a meal. 06/25/16  Yes Kathlen ModyAkula, Vijaya, MD  fluticasone (FLOVENT HFA) 220 MCG/ACT inhaler Inhale 2 puffs into the lungs 2 (two) times daily.   Yes [provider]  OXYGEN Inhale 3.5 L into the lungs daily.   Yes [provider]  pantoprazole (PROTONIX) 40 MG tablet Take 1 tablet (40 mg total) by mouth daily at 6 (six) AM. Patient taking differently: Take 40 mg by mouth every evening.  06/26/16  Yes Kathlen ModyAkula, Vijaya, MD  potassium chloride SA (K-DUR,KLOR-CON) 20 MEQ tablet Take 1 tablet (20 mEq total) by mouth 2  (two) times daily. 06/13/18  Yes Sherryll BurgerShah, Pratik D, DO  rivaroxaban (XARELTO) 20 MG TABS tablet Take 1 tablet (20 mg total) by mouth daily with breakfast. Patient taking differently: Take 20 mg by mouth daily with supper.  06/26/16  Yes Kathlen ModyAkula, Vijaya, MD  spironolactone (ALDACTONE) 100 MG tablet Take 100 mg by mouth daily. For fluid 03/18/17  Yes [provider]  tamsulosin (FLOMAX) 0.4 MG CAPS capsule Take 1 capsule (0.4 mg total) by mouth daily. 06/25/16  Yes Kathlen ModyAkula, Vijaya, MD  beclomethasone (QVAR) 40 MCG/ACT inhaler Inhale 2 puffs into the lungs daily as needed (for shortness of breath).    [provider]  potassium chloride 20 MEQ TBCR Take 20 mEq by mouth 2 (two) times daily. 09/03/15 09/03/15  Ward, Layla MawKristen N, DO    Family History Family History  Problem Relation Age of Onset  . Stroke Father   . Heart attack Father   . Aneurysm Mother  Cerebral aneurysm  . Hypertension Sister   . Colon cancer Neg Hx   . Inflammatory bowel disease Neg Hx   . Liver disease Neg Hx     Social History Social History   Tobacco Use  . Smoking status: Former Smoker    Packs/day: 0.50    Years: 20.00    Pack years: 10.00    Types: Cigarettes    Start date: 04/26/1988    Last attempt to quit: 06/23/2007    Years since quitting: 11.1  . Smokeless tobacco: Never Used  . Tobacco comment: 1 ppd former smoker  Substance Use Topics  . Alcohol use: No    Alcohol/week: 0.0 standard drinks    Comment: No etoh since 2009  . Drug use: No     Allergies   Banana; Bee venom; Food; Aspirin; Metolazone; Oatmeal; Orange juice [orange oil]; Torsemide; Diltiazem; Hydralazine; and Lipitor [atorvastatin]   Review of Systems Review of Systems  Constitutional: Negative for appetite change.  HENT: Negative for dental problem.   Respiratory: Positive for shortness of breath.   Cardiovascular: Positive for chest pain and leg swelling.  Gastrointestinal: Negative for abdominal pain.  Endocrine:  Negative for polyuria.  Genitourinary: Negative for flank pain.  Musculoskeletal: Negative for back pain.  Skin: Negative for rash.  Neurological: Negative for weakness.  Psychiatric/Behavioral: Negative for confusion.     Physical Exam Updated Vital Signs BP (!) 143/93 (BP Location: Left Arm)   Pulse 68   Temp 98.3 F (36.8 C) (Oral)   Resp 18   Ht 6' (1.829 m)   Wt (!) 170.9 kg   SpO2 93%   BMI 51.10 kg/m   Physical Exam Constitutional:      Comments: Patient is obese.  HENT:     Head: Normocephalic.  Eyes:     Comments: Patient is wearing sunglasses.  Neck:     Vascular: JVD present.  Cardiovascular:     Rate and Rhythm: Rhythm irregular.  Pulmonary:     Breath sounds: Rales present.     Comments: Rales bilateral lung fields. Chest:     Chest wall: No edema.  Musculoskeletal:     Right lower leg: Edema present.     Left lower leg: Edema present.  Skin:    General: Skin is warm.  Neurological:     Mental Status: He is alert and oriented to person, place, and time.      ED Treatments / Results  Labs (all labs ordered are listed, but only abnormal results are displayed) Labs Reviewed  TROPONIN I - Abnormal; Notable for the following components:      Result Value   Troponin I 0.04 (*)    All other components within normal limits  BRAIN NATRIURETIC PEPTIDE - Abnormal; Notable for the following components:   B Natriuretic Peptide 166.0 (*)    All other components within normal limits  CBC WITH DIFFERENTIAL/PLATELET - Abnormal; Notable for the following components:   Hemoglobin 12.5 (*)    All other components within normal limits  COMPREHENSIVE METABOLIC PANEL - Abnormal; Notable for the following components:   Potassium 3.4 (*)    Calcium 8.4 (*)    All other components within normal limits  TROPONIN I - Abnormal; Notable for the following components:   Troponin I 0.03 (*)    All other components within normal limits  TROPONIN I - Abnormal; Notable  for the following components:   Troponin I 0.03 (*)    All other components  within normal limits  BASIC METABOLIC PANEL - Abnormal; Notable for the following components:   Calcium 8.6 (*)    All other components within normal limits  CBC - Abnormal; Notable for the following components:   Hemoglobin 12.9 (*)    All other components within normal limits  TROPONIN I - Abnormal; Notable for the following components:   Troponin I 0.03 (*)    All other components within normal limits  BASIC METABOLIC PANEL - Abnormal; Notable for the following components:   Glucose, Bld 101 (*)    Calcium 8.4 (*)    All other components within normal limits  BASIC METABOLIC PANEL - Abnormal; Notable for the following components:   Calcium 8.5 (*)    All other components within normal limits  MAGNESIUM  PHOSPHORUS  MAGNESIUM  MAGNESIUM    EKG EKG Interpretation  Date/Time:  Friday July 22 2018 15:15:59 EST Ventricular Rate:  99 PR Interval:    QRS Duration: 96 QT Interval:  358 QTC Calculation: 460 R Axis:   57 Text Interpretation:  Atrial fibrillation Abnormal T, consider ischemia, lateral leads Confirmed by Benjiman Core [phone]) on 07/22/2018 3:55:36 PM   Radiology Dg Chest 2 View  Result Date: 08/15/2018 CLINICAL DATA:  53 y/o M; shortness of breath, history of heart failure. EXAM: CHEST - 2 VIEW COMPARISON:  08/03/2018 chest radiograph FINDINGS: Stable cardiomegaly given projection and technique. Diffuse reticular and hazy basilar opacities of the lungs. Possible small right effusion. No pneumothorax. No acute osseous abnormality is evident. IMPRESSION: Cardiomegaly, interstitial pulmonary edema, and mild alveolar edema. Possible small right effusion. Electronically Signed   By: Mitzi Hansen M.D.   On: 08/15/2018 02:01    Procedures Procedures (including critical care time)  Medications Ordered in ED Medications  furosemide (LASIX) injection 80 mg (80 mg Intravenous  Given 07/22/18 2011)  potassium chloride SA (K-DUR,KLOR-CON) CR tablet 40 mEq (40 mEq Oral Given 07/22/18 2158)     Initial Impression / Assessment and Plan / ED Course  I have reviewed the triage vital signs and the nursing notes.  Pertinent labs & imaging results that were available during my care of the patient were reviewed by me and considered in my medical decision making (see chart for details).    Patient presents with shortness of breath.  Acute on chronic CHF.  BNP is elevated.  X-ray shows volume overload with vascular congestion.  Weight is up at least 10 pounds.  Reviewing records does have a history of noncompliance.  Was at [facility] yesterday and had presumably IV Lasix at that time.  Patient at this point cannot lay down due to dyspnea although she has not hypoxic.  I feel patient benefit from admission to the hospital for further diuresis.  CRITICAL CARE Performed by: Benjiman Core Total critical care time: 30 minutes Critical care time was exclusive of separately billable procedures and treating other patients. Critical care was necessary to treat or prevent imminent or life-threatening deterioration. Critical care was time spent personally by me on the following activities: development of treatment plan with patient and/or surrogate as well as nursing, discussions with consultants, evaluation of patient's response to treatment, examination of patient, obtaining history from patient or surrogate, ordering and performing treatments and interventions, ordering and review of laboratory studies, ordering and review of radiographic studies, pulse oximetry and re-evaluation of patient's condition.   Final Clinical Impressions(s) / ED Diagnoses   Final diagnoses:  Acute on chronic congestive heart failure, unspecified heart failure type (  [facility])    ED Discharge Orders         Ordered    indomethacin (INDOCIN) 25 MG capsule  3 times daily PRN,   Status:  Discontinued      07/25/18 0929    Increase activity slowly     07/25/18 1610    Diet - low sodium heart healthy     07/25/18 0929    (HEART FAILURE PATIENTS) Call MD:  Anytime you have any of the following symptoms: 1) 3 pound weight gain in 24 hours or 5 pounds in 1 week 2) shortness of breath, with or without a dry hacking cough 3) swelling in the hands, feet or stomach 4) if you have to sleep on extra pillows at night in order to breathe.     07/25/18 0929    Call MD for:  extreme fatigue     07/25/18 0929    Call MD for:  persistant dizziness or light-headedness     07/25/18 0929    Call MD for:  difficulty breathing, headache or visual disturbances     07/25/18 0929    Call MD for:  persistant nausea and vomiting     07/25/18 0929           Benjiman Core, MD 07/22/18 Berton Bon    Benjiman Core, MD 08/15/18 [phone]

## 2018-07-22 NOTE — ED Notes (Signed)
CRITICAL VALUE ALERT  Critical Value:  Troponin 0.04  Date & Time Notied:  07/22/2018 1812  Provider Notified: Dr. Rubin Payor   Orders Received/Actions taken: None yet

## 2018-07-22 NOTE — ED Triage Notes (Signed)
Patient brought in by EMS for complaint of shortness of breath x 2 days. States he was seen at [facility] yesterday for same. Denies chest pain.

## 2018-07-22 NOTE — ED Notes (Signed)
Pt updated on plan of care, denies any complaints at present,  

## 2018-07-23 ENCOUNTER — Encounter (HOSPITAL_COMMUNITY): Payer: Self-pay | Admitting: *Deleted

## 2018-07-23 DIAGNOSIS — K219 Gastro-esophageal reflux disease without esophagitis: Secondary | ICD-10-CM

## 2018-07-23 DIAGNOSIS — E876 Hypokalemia: Secondary | ICD-10-CM

## 2018-07-23 DIAGNOSIS — R7989 Other specified abnormal findings of blood chemistry: Secondary | ICD-10-CM

## 2018-07-23 DIAGNOSIS — D649 Anemia, unspecified: Secondary | ICD-10-CM

## 2018-07-23 DIAGNOSIS — I5043 Acute on chronic combined systolic (congestive) and diastolic (congestive) heart failure: Secondary | ICD-10-CM

## 2018-07-23 DIAGNOSIS — I10 Essential (primary) hypertension: Secondary | ICD-10-CM

## 2018-07-23 DIAGNOSIS — I482 Chronic atrial fibrillation, unspecified: Secondary | ICD-10-CM

## 2018-07-23 LAB — BASIC METABOLIC PANEL
Anion gap: 6 (ref 5–15)
BUN: 11 mg/dL (ref 6–20)
CO2: 29 mmol/L (ref 22–32)
Calcium: 8.6 mg/dL — ABNORMAL LOW (ref 8.9–10.3)
Chloride: 105 mmol/L (ref 98–111)
Creatinine, Ser: 0.96 mg/dL (ref 0.61–1.24)
GFR calc Af Amer: >60 mL/min (ref >60)
GFR calc non Af Amer: >60 mL/min (ref >60)
Glucose, Bld: 91 mg/dL (ref 70–99)
Potassium: 3.5 mmol/L (ref 3.5–5.1)
Sodium: 140 mmol/L (ref 135–145)

## 2018-07-23 LAB — TROPONIN I
Troponin I: 0.03 ng/mL (ref <0.03)
Troponin I: 0.03 ng/mL (ref <0.03)

## 2018-07-23 LAB — CBC
HCT: 42.2 % (ref 39.0–52.0)
Hemoglobin: 12.9 g/dL — ABNORMAL LOW (ref 13.0–17.0)
MCH: 27.7 pg (ref 26.0–34.0)
MCHC: 30.6 g/dL (ref 30.0–36.0)
MCV: 90.8 fL (ref 80.0–100.0)
Platelets: 202 10*3/uL (ref 150–400)
RBC: 4.65 MIL/uL (ref 4.22–5.81)
RDW: 13.9 % (ref 11.5–15.5)
WBC: 4.9 10*3/uL (ref 4.0–10.5)
nRBC: 0 % (ref 0.0–0.2)

## 2018-07-23 NOTE — Progress Notes (Signed)
RN paged C. Bodenheimer, NP to inquire if telemetry should be continued as patient had 2.05 second pause in heart rate earlier today, awaiting response.  P.J. Henderson Newcomer, RN

## 2018-07-23 NOTE — Progress Notes (Signed)
Call received back from C. Bodenheimer, NP that it is ok to d/c telemetry.  Telemetry removed per order.  P.J. Henderson Newcomer, RN

## 2018-07-23 NOTE — Progress Notes (Signed)
Notified dr. Laural Benes that patient had a 2.05 second pause in heart rate.

## 2018-07-23 NOTE — Progress Notes (Addendum)
PROGRESS NOTE    Samuel KlippelMark T Fitzpatrick  ZOX:[id number]  DOB: 05/27/1966  DOA: 07/22/2018 PCP: Pearson GrippeKim, [name], MD  Brief Admission Hx: 53 y.o. male with medical history significant of allergic rhinitis, asthma, atrial fibrillation, chronic systolic CHF, hypertension, GERD, CAD, history of MI, with normal coronaries in December 2016, treatment noncompliance, OSA, osteoarthritis, PUD who is coming to the emergency department with complaints of progressively worse dyspnea for the past 2 to 3 days associated with orthopnea, lower extremity edema and a 12 to 13 pound gain over the last 7 days.  MDM/Assessment & Plan:   1. Acute on chronic combined systolic/diastolic heart failure - due to noncompliance with diet and medications, he is diuresing well on IV lasix. Continue daily weights, strict I/Os, monitor electrolytes. 2. Hyperlipidemia - continue atorvastatin 40 mg daily.   3. Essential hypertension -better controlled with diuresis, he has been restarted on his home carvedilol and lisinopril.  4. GERD - pantoprazole 40 mg daily.  5. Chronic atrial fibrillation - continue carvedilol for rate control and xarelto for full anticoagulation.  6. Normocytic anemia - Following.  7. Morbid obesity - outpatient referral for gastric bypass recommended.  8. Elevated troponin - demand ischemia in setting of acute heart failure.  No chest pain symptoms.  9. OSA - nightly CPAP ordered.   DVT prophylaxis: Xarelto Code Status: Full  Family Communication: patient  Disposition Plan: he needs ongoing INP hospital care for IV diuresis   Consultants:  n/a  Procedures:  Echocardiogram pending   Antimicrobials:  n/a   Subjective: Pt says that he is urinating frequently.  He has no chest pain but still has SOB.  He has not been ambulating.   Objective: Vitals:   07/22/18 2127 07/22/18 2154 07/22/18 2253 07/23/18 0526  BP:  (!) 159/109 (!) 188/152 134/86  Pulse:  94 83 69  Resp:  (!) 24 (!) 21 (!) 22  Temp:   97.9 F (36.6 C) (!) 97.5 F (36.4 C) 98.7 F (37.1 C)  TempSrc:  Oral Oral Oral  SpO2: 100% 100% 100% 99%  Weight:      Height:        Intake/Output Summary (Last 24 hours) at 07/23/2018 1549 Last data filed at 07/23/2018 1254 Gross per 24 hour  Intake 240 ml  Output 4150 ml  Net -3910 ml   Filed Weights   07/22/18 1513  Weight: (!) 170.1 kg   REVIEW OF SYSTEMS  As per history otherwise all reviewed and reported negative  Exam:  General exam: morbidly obese male, awake, alert, NAD, cooperative.  Respiratory system: crackles at bilateral bases, distant sounds, hard to auscultate due to body habitus. No increased work of breathing. Cardiovascular system: normal S1 & S2 heard. Moderate JVD, No murmurs. Gastrointestinal system: Abdomen is nondistended, soft and nontender. Normal bowel sounds heard. Central nervous system: Alert and oriented. No focal neurological deficits. Extremities: 2++ pitting edema bilateral LEs.   Data Reviewed: Basic Metabolic Panel: Recent Labs  Lab 07/22/18 1646 07/23/18 0529  NA 139 140  K 3.4* 3.5  CL 104 105  CO2 28 29  GLUCOSE 92 91  BUN 13 11  CREATININE 1.11 0.96  CALCIUM 8.4* 8.6*  MG 2.1  --   PHOS 3.8  --    Liver Function Tests: Recent Labs  Lab 07/22/18 1646  AST 18  ALT 14  ALKPHOS 65  BILITOT 1.0  PROT 6.6  ALBUMIN 3.6   No results for input(s): LIPASE, AMYLASE in the last 168  hours. No results for input(s): AMMONIA in the last 168 hours. CBC: Recent Labs  Lab 07/22/18 1646 07/23/18 0529  WBC 4.2 4.9  NEUTROABS 2.6  --   HGB 12.5* 12.9*  HCT 40.3 42.2  MCV 89.0 90.8  PLT 191 202   Cardiac Enzymes: Recent Labs  Lab 07/22/18 1646 07/22/18 2221 07/23/18 0529 07/23/18 1054  TROPONINI 0.04* 0.03* 0.03* 0.03*   CBG (last 3)  No results for input(s): GLUCAP in the last 72 hours. No results found for this or any previous visit (from the past 240 hour(s)).   Studies: Dg Chest 2 View  Result Date:  07/22/2018 CLINICAL DATA:  Shortness of breath for 2 days. EXAM: CHEST - 2 VIEW COMPARISON:  PA and lateral chest 07/21/2018 and 05/11/2017. Single-view of the chest 07/02/2018 and 09/03/2017 FINDINGS: Cardiomegaly and chronic vascular congestion are again seen. No consolidative process, pneumothorax or effusion. No acute or focal bony abnormality. IMPRESSION: Cardiomegaly and pulmonary vascular congestion. Electronically Signed   By: Drusilla Kanner M.D.   On: 07/22/2018 16:49   Scheduled Meds: . aspirin  81 mg Oral Daily  . atorvastatin  40 mg Oral q1800  . carvedilol  37.5 mg Oral BID WC  . docusate sodium  100 mg Oral BID  . furosemide  80 mg Intravenous BID  . lisinopril  5 mg Oral Daily  . pantoprazole  40 mg Oral QPM  . potassium chloride SA  20 mEq Oral BID  . rivaroxaban  20 mg Oral Q supper  . spironolactone  100 mg Oral Daily  . tamsulosin  0.4 mg Oral Daily   Continuous Infusions:  Principal Problem:   Volume overload Active Problems:   Hyperlipidemia   Essential hypertension   GERD (gastroesophageal reflux disease)   Hypokalemia   Atrial fibrillation, chronic   Normocytic anemia   Morbid obesity due to excess calories (HCC)   Elevated troponin   Acute on chronic combined systolic and diastolic heart failure (HCC)  Time spent:   Standley Dakins, MD [facility] 07/23/2018, 3:49 PM    LOS: 0 days

## 2018-07-24 DIAGNOSIS — D649 Anemia, unspecified: Secondary | ICD-10-CM | POA: Diagnosis present

## 2018-07-24 DIAGNOSIS — I5033 Acute on chronic diastolic (congestive) heart failure: Secondary | ICD-10-CM | POA: Diagnosis present

## 2018-07-24 DIAGNOSIS — Z9111 Patient's noncompliance with dietary regimen: Secondary | ICD-10-CM | POA: Diagnosis not present

## 2018-07-24 DIAGNOSIS — I428 Other cardiomyopathies: Secondary | ICD-10-CM | POA: Diagnosis present

## 2018-07-24 DIAGNOSIS — Z87891 Personal history of nicotine dependence: Secondary | ICD-10-CM | POA: Diagnosis not present

## 2018-07-24 DIAGNOSIS — I2510 Atherosclerotic heart disease of native coronary artery without angina pectoris: Secondary | ICD-10-CM | POA: Diagnosis present

## 2018-07-24 DIAGNOSIS — I5021 Acute systolic (congestive) heart failure: Secondary | ICD-10-CM | POA: Diagnosis not present

## 2018-07-24 DIAGNOSIS — K219 Gastro-esophageal reflux disease without esophagitis: Secondary | ICD-10-CM | POA: Diagnosis present

## 2018-07-24 DIAGNOSIS — R0602 Shortness of breath: Secondary | ICD-10-CM | POA: Diagnosis present

## 2018-07-24 DIAGNOSIS — I482 Chronic atrial fibrillation, unspecified: Secondary | ICD-10-CM | POA: Diagnosis not present

## 2018-07-24 DIAGNOSIS — E785 Hyperlipidemia, unspecified: Secondary | ICD-10-CM | POA: Diagnosis present

## 2018-07-24 DIAGNOSIS — Z7901 Long term (current) use of anticoagulants: Secondary | ICD-10-CM | POA: Diagnosis not present

## 2018-07-24 DIAGNOSIS — G4733 Obstructive sleep apnea (adult) (pediatric): Secondary | ICD-10-CM | POA: Diagnosis present

## 2018-07-24 DIAGNOSIS — Z6841 Body Mass Index (BMI) 40.0 and over, adult: Secondary | ICD-10-CM | POA: Diagnosis not present

## 2018-07-24 DIAGNOSIS — Z7951 Long term (current) use of inhaled steroids: Secondary | ICD-10-CM | POA: Diagnosis not present

## 2018-07-24 DIAGNOSIS — Z9114 Patient's other noncompliance with medication regimen: Secondary | ICD-10-CM | POA: Diagnosis not present

## 2018-07-24 DIAGNOSIS — I252 Old myocardial infarction: Secondary | ICD-10-CM | POA: Diagnosis not present

## 2018-07-24 DIAGNOSIS — Z79899 Other long term (current) drug therapy: Secondary | ICD-10-CM | POA: Diagnosis not present

## 2018-07-24 DIAGNOSIS — I10 Essential (primary) hypertension: Secondary | ICD-10-CM | POA: Diagnosis not present

## 2018-07-24 DIAGNOSIS — I248 Other forms of acute ischemic heart disease: Secondary | ICD-10-CM | POA: Diagnosis present

## 2018-07-24 DIAGNOSIS — J45909 Unspecified asthma, uncomplicated: Secondary | ICD-10-CM | POA: Diagnosis present

## 2018-07-24 DIAGNOSIS — Z7982 Long term (current) use of aspirin: Secondary | ICD-10-CM | POA: Diagnosis not present

## 2018-07-24 DIAGNOSIS — I5043 Acute on chronic combined systolic (congestive) and diastolic (congestive) heart failure: Secondary | ICD-10-CM | POA: Diagnosis not present

## 2018-07-24 DIAGNOSIS — R7989 Other specified abnormal findings of blood chemistry: Secondary | ICD-10-CM | POA: Diagnosis not present

## 2018-07-24 DIAGNOSIS — I110 Hypertensive heart disease with heart failure: Secondary | ICD-10-CM | POA: Diagnosis present

## 2018-07-24 DIAGNOSIS — E8779 Other fluid overload: Secondary | ICD-10-CM | POA: Diagnosis not present

## 2018-07-24 DIAGNOSIS — E876 Hypokalemia: Secondary | ICD-10-CM | POA: Diagnosis present

## 2018-07-24 DIAGNOSIS — Z9981 Dependence on supplemental oxygen: Secondary | ICD-10-CM | POA: Diagnosis not present

## 2018-07-24 LAB — BASIC METABOLIC PANEL
Anion gap: 7 (ref 5–15)
BUN: 16 mg/dL (ref 6–20)
CO2: 28 mmol/L (ref 22–32)
Calcium: 8.4 mg/dL — ABNORMAL LOW (ref 8.9–10.3)
Chloride: 104 mmol/L (ref 98–111)
Creatinine, Ser: 1.16 mg/dL (ref 0.61–1.24)
GFR calc Af Amer: >60 mL/min (ref >60)
Glucose, Bld: 101 mg/dL — ABNORMAL HIGH (ref 70–99)
Potassium: 3.6 mmol/L (ref 3.5–5.1)
Sodium: 139 mmol/L (ref 135–145)

## 2018-07-24 LAB — MAGNESIUM: MAGNESIUM: 2 mg/dL (ref 1.7–2.4)

## 2018-07-24 NOTE — Progress Notes (Addendum)
PROGRESS NOTE    [name]  BWI:[id number]  DOB: Mar 10, 1966  DOA: 07/22/2018 PCP: Pearson Grippe, MD  Brief Admission Hx: 53 y.o. male with medical history significant of allergic rhinitis, asthma, atrial fibrillation, chronic systolic CHF, hypertension, GERD, CAD, history of MI, with normal coronaries in December 2016, treatment noncompliance, OSA, osteoarthritis, PUD who is coming to the emergency department with complaints of progressively worse dyspnea for the past 2 to 3 days associated with orthopnea, lower extremity edema and a 12 to 13 pound gain over the last 7 days.  MDM/Assessment & Plan:   1. Acute on chronic diastolic heart failure - due to noncompliance with diet and medications, he is diuresing well on IV lasix. Continue daily weights, strict I/Os, monitor electrolytes.  He reports feeling better today.  2. Hyperlipidemia - continue atorvastatin 40 mg daily.   3. Essential hypertension -better controlled with diuresis, he has been restarted on his home carvedilol and lisinopril.  4. GERD - pantoprazole 40 mg daily.  5. Chronic atrial fibrillation - continue carvedilol for rate control and xarelto for full anticoagulation.  6. Normocytic anemia - Following.  7. Morbid obesity - outpatient referral for gastric bypass recommended.  8. Elevated troponin - demand ischemia in setting of acute heart failure.  No chest pain symptoms.  9. OSA - nightly CPAP ordered.   DVT prophylaxis: Xarelto Code Status: Full  Family Communication: patient  Disposition Plan: he needs ongoing INP hospital care for IV diuresis   Consultants:  n/a  Procedures:  Echocardiogram 04/01/18 Study Conclusions - Left ventricle: The cavity size was normal. Wall thickness was  increased in a pattern of moderate LVH. The estimated ejection fraction was 55%. Wall motion was normal; there were no regional wall motion abnormalities. The study was not technically  sufficient to allow evaluation of LV  diastolic dysfunction due to atrial fibrillation. - Aortic valve: Mildly calcified annulus. Trileaflet. - Mitral valve: Mildly thickened leaflets. There was trivial regurgitation. - Left atrium: The atrium was moderately to severely dilated. - Right atrium: Central venous pressure (est): 3 mm Hg. - Tricuspid valve: There was trivial regurgitation. - Pulmonary arteries: Systolic pressure could not be accurately estimated. - Pericardium, extracardiac: A trivial pericardial effusion was identified.  Antimicrobials:  n/a   Subjective: Pt says that he is urinating frequently.  He has no chest pain but still has SOB.  He has not been ambulating.   Objective: Vitals:   07/23/18 2138 07/23/18 2315 07/24/18 0556 07/24/18 0655  BP: (!) 107/49  115/68   Pulse: (!) 55 72 67   Resp: (!) 22 18 18    Temp: 97.7 F (36.5 C)  99.4 F (37.4 C)   TempSrc: Oral  Oral   SpO2: 100% 98% 99%   Weight:    (!) 170.9 kg  Height:        Intake/Output Summary (Last 24 hours) at 07/24/2018 0941 Last data filed at 07/24/2018 0410 Gross per 24 hour  Intake 480 ml  Output 3325 ml  Net -2845 ml   Filed Weights   07/22/18 1513 07/24/18 0655  Weight: (!) 170.1 kg (!) 170.9 kg   REVIEW OF SYSTEMS  As per history otherwise all reviewed and reported negative  Exam:  General exam: morbidly obese male, awake, alert, NAD, cooperative.  Respiratory system: persistent but less crackles at bilateral bases, distant sounds, hard to auscultate due to body habitus. No increased work of breathing. Cardiovascular system: normal S1 & S2 heard. Moderate JVD, No murmurs.  Gastrointestinal system: Abdomen is nondistended, soft and nontender. Normal bowel sounds heard. Central nervous system: Alert and oriented. No focal neurological deficits. Extremities: 2+ pitting edema bilateral LEs.   Data Reviewed: Basic Metabolic Panel: Recent Labs  Lab 07/22/18 1646 07/23/18 0529 07/24/18 0512  NA 139 140 139  K 3.4* 3.5  3.6  CL 104 105 104  CO2 28 29 28   GLUCOSE 92 91 101*  BUN 13 11 16   CREATININE 1.11 0.96 1.16  CALCIUM 8.4* 8.6* 8.4*  MG 2.1  --  2.0  PHOS 3.8  --   --    Liver Function Tests: Recent Labs  Lab 07/22/18 1646  AST 18  ALT 14  ALKPHOS 65  BILITOT 1.0  PROT 6.6  ALBUMIN 3.6   No results for input(s): LIPASE, AMYLASE in the last 168 hours. No results for input(s): AMMONIA in the last 168 hours. CBC: Recent Labs  Lab 07/22/18 1646 07/23/18 0529  WBC 4.2 4.9  NEUTROABS 2.6  --   HGB 12.5* 12.9*  HCT 40.3 42.2  MCV 89.0 90.8  PLT 191 202   Cardiac Enzymes: Recent Labs  Lab 07/22/18 1646 07/22/18 2221 07/23/18 0529 07/23/18 1054  TROPONINI 0.04* 0.03* 0.03* 0.03*   CBG (last 3)  No results for input(s): GLUCAP in the last 72 hours. No results found for this or any previous visit (from the past 240 hour(s)).   Studies: Dg Chest 2 View  Result Date: 07/22/2018 CLINICAL DATA:  Shortness of breath for 2 days. EXAM: CHEST - 2 VIEW COMPARISON:  PA and lateral chest 07/21/2018 and 05/11/2017. Single-view of the chest 07/02/2018 and 09/03/2017 FINDINGS: Cardiomegaly and chronic vascular congestion are again seen. No consolidative process, pneumothorax or effusion. No acute or focal bony abnormality. IMPRESSION: Cardiomegaly and pulmonary vascular congestion. Electronically Signed   By: Drusilla Kanner M.D.   On: 07/22/2018 16:49   Scheduled Meds: . aspirin  81 mg Oral Daily  . atorvastatin  40 mg Oral q1800  . carvedilol  37.5 mg Oral BID WC  . docusate sodium  100 mg Oral BID  . furosemide  80 mg Intravenous BID  . lisinopril  5 mg Oral Daily  . pantoprazole  40 mg Oral QPM  . potassium chloride SA  20 mEq Oral BID  . rivaroxaban  20 mg Oral Q supper  . spironolactone  100 mg Oral Daily  . tamsulosin  0.4 mg Oral Daily   Continuous Infusions:  Principal Problem:   Volume overload Active Problems:   Hyperlipidemia   Essential hypertension   GERD  (gastroesophageal reflux disease)   Hypokalemia   Atrial fibrillation, chronic   Normocytic anemia   Morbid obesity due to excess calories (HCC)   Elevated troponin   Acute on chronic combined systolic and diastolic heart failure (HCC)  Time spent:   Standley Dakins, MD [facility] 07/24/2018, 9:41 AM    LOS: 0 days

## 2018-07-25 ENCOUNTER — Inpatient Hospital Stay (HOSPITAL_COMMUNITY): Payer: Medicare Other

## 2018-07-25 DIAGNOSIS — E7849 Other hyperlipidemia: Secondary | ICD-10-CM

## 2018-07-25 LAB — BASIC METABOLIC PANEL
Anion gap: 8 (ref 5–15)
BUN: 18 mg/dL (ref 6–20)
CO2: 28 mmol/L (ref 22–32)
Calcium: 8.5 mg/dL — ABNORMAL LOW (ref 8.9–10.3)
Chloride: 106 mmol/L (ref 98–111)
Creatinine, Ser: 1.21 mg/dL (ref 0.61–1.24)
Glucose, Bld: 89 mg/dL (ref 70–99)
Potassium: 3.5 mmol/L (ref 3.5–5.1)
Sodium: 142 mmol/L (ref 135–145)

## 2018-07-25 LAB — MAGNESIUM: Magnesium: 2 mg/dL (ref 1.7–2.4)

## 2018-07-25 MED ORDER — INDOMETHACIN 25 MG PO CAPS: 25.0000 mg | ORAL_CAPSULE | Freq: Three times a day (TID) | ORAL | Status: DC | PRN

## 2018-07-25 NOTE — Discharge Instructions (Signed)
Heart Failure  Heart failure means your heart has trouble pumping blood. This makes it hard for your body to work well. Heart failure is usually a long-term (chronic) condition. You must take good care of yourself and follow your treatment plan from your doctor. Follow these instructions at home: Medicines  Take over-the-counter and prescription medicines only as told by your doctor. ? Do not stop taking your medicine unless your doctor told you to do that. ? Do not skip any doses. ? Refill your prescriptions before you run out of medicine. You need your medicines every day. Eating and drinking   Eat heart-healthy foods. Talk with a diet and nutrition specialist (dietitian) to make an eating plan.  Choose foods that: ? Have no trans fat. ? Are low in saturated fat and cholesterol.  Choose healthy foods, like: ? Fresh or frozen fruits and vegetables. ? Fish. ? Low-fat (lean) meats. ? Legumes (like beans, peas, and lentils). ? Fat-free or low-fat dairy products. ? Whole-grain foods. ? High-fiber foods.  Limit salt (sodium) if told by your doctor. Ask your nutrition specialist to recommend heart-healthy seasonings.  Cook in healthy ways instead of frying. Healthy ways of cooking include: ? Roasting. ? Grilling. ? Broiling. ? Baking. ? Poaching. ? Steaming. ? Stir-frying.  Limit how much fluid you drink, if told by your doctor. Lifestyle  Do not smoke or use chewing tobacco. Do not use nicotine gum or patches before talking to your doctor.  Limit alcohol intake to no more than 1 drink a day for non-pregnant women and 2 drinks a day for men. One drink equals 12 oz of beer, 5 oz of wine, or 1 oz of hard liquor. ? Tell your doctor if you drink alcohol many times a week. ? Talk with your doctor about whether any alcohol is safe for you. ? You should stop drinking alcohol: ? If your heart has been damaged by alcohol. ? You have very bad heart failure.  Do not use illegal  drugs.  Lose weight if told by your doctor.  Do moderate physical activity if told by your doctor. Ask your doctor what activities are safe for you if: ? You are of older age (elderly). ? You have very bad heart failure. Keep track of important information  Weigh yourself every day. ? Weigh yourself every morning after you pee (urinate) and before breakfast. ? Wear the same amount of clothing each time. ? Write down your daily weight. Give your record to your doctor.  Check and write down your blood pressure as told by your doctor.  Check your pulse as told by your doctor. Dealing with heat and cold  If the weather is very hot: ? Avoid activity that takes a lot of energy. ? Use air conditioning or fans, or find a cooler place. ? Avoid caffeine. ? Avoid alcohol. ? Wear clothing that is loose-fitting, lightweight, and light-colored.  If the weather is very cold: ? Avoid activity that takes a lot of energy. ? Layer your clothes. ? Wear mittens or gloves, a hat, and a scarf when you go outside. ? Avoid alcohol. General instructions  Manage other conditions that you have as told by your doctor.  Learn to manage stress. If you need help, ask your doctor.  Plan rest periods for when you get tired.  Get education and support as needed.  Get rehab (rehabilitation) to help you stay independent and to help with everyday tasks.  Stay up to date with shots (  immunizations), especially pneumococcal and flu (influenza) shots.  Keep all follow-up visits as told by your doctor. This is important. Contact a doctor if:  You gain weight quickly.  You are more short of breath than normal.  You cannot do your normal activities.  You tire easily.  You cough more than normal, especially with activity.  You have any or more puffiness (swelling) in areas such as your hands, feet, ankles, or belly (abdomen).  You cannot sleep because it is hard to breathe.  You feel like your heart  is beating fast (palpitations).  You get dizzy or light-headed when you stand up. Get help right away if:  You have trouble breathing.  You or someone else notices a change in your awareness. This could be trouble staying awake or trouble concentrating.  You have chest pain or discomfort.  You pass out (faint). Summary  Heart failure means your heart has trouble pumping blood.  Make sure you refill your prescriptions before you run out of medicine. You need your medicines every day.  Keep records of your weight and blood pressure to give to your doctor.  Contact a doctor if you gain weight quickly. This information is not intended to replace advice given to you by your health care provider. Make sure you discuss any questions you have with your health care provider. Document Released: 03/17/2008 Document Revised: 03/02/2018 Document Reviewed: 06/30/2016 Elsevier Interactive Patient Education  2019 [place].   Heart Failure Eating Plan Heart failure, also called congestive heart failure, occurs when your heart does not pump blood well enough to meet your body's needs for oxygen-rich blood. Heart failure is a long-term (chronic) condition. Living with heart failure can be challenging. However, following your health care provider's instructions about a healthy lifestyle and working with a diet and nutrition specialist (dietitian) to choose the right foods may help to improve your symptoms. What are tips for following this plan? General guidelines  Do not eat more than 2,300 mg of salt (sodium) a day. The amount of sodium that is recommended for you may be lower, depending on your condition.  Maintain a healthy body weight as directed. Ask your health care provider what a healthy weight is for you. ? Check your weight every day. ? Work with your health care provider and dietitian to make a plan that is right for you to lose weight or maintain your current weight.  Limit how much  fluid you drink. Ask your health care provider or dietitian how much fluid you can have each day.  Limit or avoid alcohol as told by your health care provider or dietitian. Reading food labels  Check food labels for the amount of sodium per serving. Choose foods that have less than 140 mg (milligrams) of sodium in each serving.  Check food labels for the number of calories per serving. This is important if you need to limit your daily calorie intake to lose weight.  Check food labels for the serving size. If you eat more than one serving, you will be eating more sodium and calories than what is listed on the label.  Look for foods that are labeled as "sodium-free," "very low sodium," or "low sodium." ? Foods labeled as "reduced sodium" or "lightly salted" may still have more sodium than what is recommended for you. Cooking  Avoid adding salt when cooking. Ask your health care provider or dietitian before using salt substitutes.  Season food with salt-free seasonings, spices, or herbs. Check the  label of seasoning mixes to make sure they do not contain salt.  Cook with heart-healthy oils, such as olive, canola, soybean, or sunflower oil.  Do not fry foods. Cook foods using low-fat methods, such as baking, boiling, grilling, and broiling.  Limit unhealthy fats when cooking by: ? Removing the skin from poultry, such as chicken. ? Removing all visible fats from meats. ? Skimming the fat off from stews, soups, and gravies before serving them. Meal planning   Limit your intake of: ? Processed, canned, or pre-packaged foods. ? Foods that are high in trans fat, such as fried foods. ? Sweets, desserts, sugary drinks, and other foods with added sugar. ? Full-fat dairy products, such as whole milk.  Eat a balanced diet that includes: ? 4-5 servings of fruit each day and 4-5 servings of vegetables each day. At each meal, try to fill half of your plate with fruits and vegetables. ? Up to 6-8  servings of whole grains each day. ? Up to 2 servings of lean meat, poultry, or fish each day. One serving of meat is equal to 3 oz. This is about the same size as a deck of cards. ? 2 servings of low-fat dairy each day. ? Heart-healthy fats. Healthy fats called omega-3 fatty acids are found in foods such as flaxseed and cold-water fish like sardines, salmon, and mackerel.  Aim to eat 25-35 g (grams) of fiber a day. Foods that are high in fiber include apples, broccoli, carrots, beans, peas, and whole grains.  Do not add salt or condiments that contain salt (such as soy sauce) to foods before eating.  When eating at a restaurant, ask that your food be prepared with less salt or no salt, if possible.  Try to eat 2 or more vegetarian meals each week.  Eat more home-cooked food and eat less restaurant, buffet, and fast food. Recommended foods The items listed may not be a complete list. Talk with your dietitian about what dietary choices are best for you. Grains Bread with less than 80 mg of sodium per slice. Whole-wheat pasta, quinoa, and brown rice. Oats and oatmeal. Barley. [place]. Grits and cream of wheat. Whole-grain and whole-wheat cold cereal. Vegetables All fresh vegetables. Vegetables that are frozen without sauce or added salt. Low-sodium or sodium-free canned vegetables. Fruits All fresh, frozen, and canned fruits. Dried fruits, such as raisins, prunes, and cranberries. Meats and other protein foods Lean cuts of meat. Skinless chicken and [place]. Fish with high omega-3 fatty acids, such as salmon, sardines, and other cold-water fishes. Eggs. Dried beans, peas, and edamame. Unsalted nuts and nut butters. Dairy Low-fat or nonfat (skim) milk and dried milk. Rice milk, soy milk, and almond milk. Low-fat or nonfat yogurt. Small amounts of reduced-sodium block cheese. Low-sodium cottage cheese. Fats and oils Olive, canola, soybean, flaxseed, or sunflower oil. Avocado. Sweets and  desserts Apple sauce. Granola bars. Sugar-free pudding and gelatin. Frozen fruit bars. Seasoning and other foods Fresh and dried herbs. Lemon or lime juice. Vinegar. Low-sodium ketchup. Salt-free marinades, salad dressings, sauces, and seasonings. Foods to avoid The items listed may not be a complete list. Talk with your dietitian about what dietary choices are best for you. Grains Bread with more than 80 mg of sodium per slice. Hot or cold cereal with more than 140 mg sodium per serving. Salted pretzels and crackers. Pre-packaged breadcrumbs. Bagels, croissants, and biscuits. Vegetables Canned vegetables. Frozen vegetables with sauce or seasonings. Creamed vegetables. [place] fries. Onion rings. Pickled vegetables and  sauerkraut. Fruits Fruits that are dried with sodium-containing preservatives. Meats and other protein foods Ribs and chicken wings. Bacon, ham, pepperoni, bologna, salami, and packaged luncheon meats. Hot dogs, bratwurst, and sausage. Canned meat. Smoked meat and fish. Salted nuts and seeds. Dairy Whole milk, half-and-half, and cream. Buttermilk. Processed cheese, cheese spreads, and cheese curds. Regular cottage cheese. Feta cheese. Shredded cheese. String cheese. Fats and oils Butter, lard, shortening, ghee, and bacon fat. Canned and packaged gravies. Seasoning and other foods Onion salt, garlic salt, table salt, and sea salt. Marinades. Regular salad dressings. Relishes, pickles, and olives. Meat flavorings and tenderizers, and bouillon cubes. Horseradish, ketchup, and mustard. Worcestershire sauce. Teriyaki sauce, soy sauce (including reduced sodium). Hot sauce and Tabasco sauce. Steak sauce, fish sauce, oyster sauce, and cocktail sauce. Taco seasonings. Barbecue sauce. Tartar sauce. Summary  A heart failure eating plan includes changes that limit your intake of sodium and unhealthy fat, and it may help you lose weight or maintain a healthy weight. Your health care provider  may also recommend limiting how much fluid you drink.  Most people with heart failure should eat no more than 2,300 mg of salt (sodium) a day. The amount of sodium that is recommended for you may be lower, depending on your condition.  Contact your health care provider or dietitian before making any major changes to your diet. This information is not intended to replace advice given to you by your health care provider. Make sure you discuss any questions you have with your health care provider. Document Released: 10/23/2016 Document Revised: 10/23/2016 Document Reviewed: 10/23/2016 Elsevier Interactive Patient Education  2019 [place] With Heart Failure  Heart failure is a long-term (chronic) condition in which the heart cannot pump enough blood through the body. When this happens, parts of the body do not get the blood and oxygen they need. There is no cure for heart failure at this time, so it is important for you to take good care of yourself and follow the treatment plan set by your health care provider. If you are living with heart failure, there are ways to help you manage the disease. Follow these instructions at home: Living with heart failure requires you to make changes in your life. Your health care team will teach you about the changes you need to make in order to relieve your symptoms and lower your risk of going to the hospital. Follow the treatment plan as set by your health care provider. Medicines Medicines are important in reducing your heart's workload, slowing the progression of heart failure, and improving your symptoms.  Take over-the-counter and prescription medicines only as told by your health care provider.  Do not stop taking your medicine unless your health care provider tells you to do that.  Do not skip any dose of your medicine.  Refill prescriptions before you run out of medicine. You need your medicines every day. Eating and drinking   Eat  heart-healthy foods. Talk with a dietitian to make an eating plan that is right for you. ? If directed by your health care provider: ? Limit salt (sodium). Lowering your sodium intake may reduce symptoms of heart failure. Ask a dietitian to recommend heart-healthy seasonings. ? Limit your fluid intake. Fluid restriction may reduce symptoms of heart failure. ? Use low-fat cooking methods instead of frying. Low-fat methods include roasting, grilling, broiling, baking, poaching, steaming, and stir-frying. ? Choose foods that contain no trans fat and are low in saturated fat and  cholesterol. Healthy choices include fresh or frozen fruits and vegetables, fish, lean meats, legumes, fat-free or low-fat dairy products, and whole-grain or high-fiber foods.  Limit alcohol intake to no more than 1 drink a day for nonpregnant women and 2 drinks a day for men. One drink equals 12 oz of beer, 5 oz of wine, or 1 oz of hard liquor. ? Drinking more than that is harmful to your heart. Tell your health care provider if you drink alcohol several times a week. ? Talk with your health care provider about whether any level of alcohol use is safe for you. Activity   Ask your health care provider about attending cardiac rehabilitation. These programs include aerobic physical activity, which provides many benefits for your heart.  If no cardiac rehabilitation program is available, ask your health care provider what aerobic exercises are safe for you to do. Lifestyle Make the lifestyle changes recommended by your health care provider. In general:  Lose weight if your health care provider tells you to do that. Weight loss may reduce symptoms of heart failure.  Do not use any products that contain nicotine or tobacco, such as cigarettes or e-cigarettes. If you need help quitting, ask your health care provider.  Do not use street (illegal) drugs.  Return to your normal activities as told by your health care provider.  Ask your health care provider what activities are safe for you. General instructions   Make sure you weigh yourself every day to track your weight. Rapid weight gain may indicate an increase in fluid in your body and may increase the workload of your heart. ? Weigh yourself every morning. Do this after you urinate but before you eat breakfast. ? Wear the same type of clothing, without shoes, each time you weigh yourself. ? Weigh yourself on the same scale and in the same spot each time.  Living with chronic heart failure often leads to emotions such as fear, stress, anxiety, and depression. If you feel any of these emotions and need help coping, contact your health care provider. Other ways to get help include: ? Talking to friends and family members about your condition. They can give you support and guidance. Explain your symptoms to them and, if comfortable, invite them to attend appointments or rehabilitation with you. ? Joining a support group for people with chronic heart failure. Talking with other people who have the same symptoms may give you new ways of coping with your disease and your emotions.  Stay up to date with your shots (vaccines). Staying current on pneumococcal and influenza vaccines is especially important in preventing germs from attacking your airways (respiratory infections).  Keep all follow-up visits as told by your health care provider. This is important. How to recognize changes in your condition You and your family members need to know what changes to watch for in your condition. Watch for the following changes and report them to your health care provider:  Sudden weight gain. Ask your health care provider what amount of weight gain to report.  Shortness of breath: ? Feeling short of breath while at rest, with no exercise or activity that required great effort. ? Feeling breathless with activity.  Swelling of your lower legs or ankles.  Difficulty  sleeping: ? You wake up feeling short of breath. ? You have to use more pillows to raise your head in order to sleep.  Frequent, dry, hacking cough.  Loss of appetite.  Feeling more tired all the time.  Depression or feelings of sadness or hopelessness.  Bloating in the stomach. Where to find more information  Local support groups. Ask your health care provider about groups near you.  The American Heart Association: www.heart.org Contact a health care provider if:  You have a rapid weight gain.  You have increasing shortness of breath that is unusual for you.  You are unable to participate in your usual physical activities.  You tire easily.  You cough more than normal, especially with physical activity.  You have any swelling or more swelling in areas such as your hands, feet, ankles, or abdomen.  You feel like your heart is beating quickly (palpitations).  You become dizzy or light-headed when you stand up. Get help right away if:  You have difficulty breathing.  You notice or your family notices a change in your awareness, such as having trouble staying awake or having difficulty with concentration.  You have pain or discomfort in your chest.  You have an episode of fainting (syncope). Summary  There is no cure for heart failure, so it is important for you to take good care of yourself and follow the treatment plan set by your health care provider.  Medicines are important in reducing your heart's workload, slowing the progression of heart failure, and improving your symptoms.  Living with chronic heart failure often leads to emotions such as fear, stress, anxiety, and depression. If you are feeling any of these emotions and need help coping, contact your health care provider. This information is not intended to replace advice given to you by your health care provider. Make sure you discuss any questions you have with your health care provider. Document  Released: 10/21/2016 Document Revised: 10/21/2016 Document Reviewed: 10/21/2016 Elsevier Interactive Patient Education  2019 [employer] Inc[employer]   Heart Failure Action Plan A heart failure action plan helps you understand what to do when you have symptoms of heart failure. Follow the plan that was created by you and your health care provider. Review your plan each time you visit your health care provider. Red zone These signs and symptoms mean you should get medical help right away:  You have trouble breathing when resting.  You have a dry cough that is getting worse.  You have swelling or pain in your legs or abdomen that is getting worse.  You suddenly gain more than 2-3 lb (0.9-1.4 kg) in a day, or more than 5 lb (2.3 kg) in one week. This amount may be more or less depending on your condition.  You have trouble staying awake or you feel confused.  You have chest pain.  You do not have an appetite.  You pass out. If you experience any of these symptoms:  Call your local emergency services (911 in the [place] right away or seek help at the emergency department of the nearest hospital. Yellow zone These signs and symptoms mean your condition may be getting worse and you should make some changes:  You have trouble breathing when you are active or you need to sleep with extra pillows.  You have swelling in your legs or abdomen.  You gain 2-3 lb (0.9-1.4 kg) in one day, or 5 lb (2.3 kg) in one week. This amount may be more or less depending on your condition.  You get tired easily.  You have trouble sleeping.  You have a dry cough. If you experience any of these symptoms:  Contact your health care provider within the next day.  Your health  care provider may adjust your medicines. Green zone These signs mean you are doing well and can continue what you are doing:  You do not have shortness of breath.  You have very little swelling or no new swelling.  Your weight is stable  (no gain or loss).  You have a normal activity level.  You do not have chest pain or any other new symptoms. Follow these instructions at home:  Take over-the-counter and prescription medicines only as told by your health care provider.  Weigh yourself daily. Your target weight is __________ lb (__________ kg). ? Call your health care provider if you gain more than __________ lb (__________ kg) in a day, or more than __________ lb (__________ kg) in one week.  Eat a heart-healthy diet. Work with a diet and nutrition specialist (dietitian) to create an eating plan that is best for you.  Keep all follow-up visits as told by your health care provider. This is important. Where to find more information  American Heart Association: www.heart.org Summary  Follow the action plan that was created by you and your health care provider.  Get help right away if you have any symptoms in the Red zone. This information is not intended to replace advice given to you by your health care provider. Make sure you discuss any questions you have with your health care provider. Document Released: 07/18/2016 Document Revised: 02/10/2017 Document Reviewed: 07/18/2016 Elsevier Interactive Patient Education  2019 [employer] Inc[employer]    IMPORTANT INFORMATION: PAY CLOSE ATTENTION   PHYSICIAN DISCHARGE INSTRUCTIONS  Follow with Primary care provider  Pearson Grippe, MD  and other consultants as instructed your Hospitalist Physician  SEEK MEDICAL CARE OR RETURN TO EMERGENCY ROOM IF SYMPTOMS COME BACK, WORSEN OR NEW PROBLEM DEVELOPS.   Please note: You were cared for by a hospitalist during your hospital stay. Every effort will be made to forward records to your primary care provider.  You can request that your primary care provider send for your hospital records if they have not received them.  Once you are discharged, your primary care physician will handle any further medical issues. Please note that NO REFILLS for  any discharge medications will be authorized once you are discharged, as it is imperative that you return to your primary care physician (or establish a relationship with a primary care physician if you do not have one) for your post hospital discharge needs so that they can reassess your need for medications and monitor your lab values.  Please get a complete blood count and chemistry panel checked by your Primary MD at your next visit, and again as instructed by your Primary MD.  Get Medicines reviewed and adjusted: Please take all your medications with you for your next visit with your Primary MD  Laboratory/radiological data: Please request your Primary MD to go over all hospital tests and procedure/radiological results at the follow up, please ask your primary care provider to get all Hospital records sent to his/her office.  In some cases, they will be blood work, cultures and biopsy results pending at the time of your discharge. Please request that your primary care provider follow up on these results.  If you are diabetic, please bring your blood sugar readings with you to your follow up appointment with primary care.    Please call and make your follow up appointments as soon as possible.    Also Note the following: If you experience worsening of your admission symptoms, develop shortness of breath,  life threatening emergency, suicidal or homicidal thoughts you must seek medical attention immediately by calling 911 or calling your MD immediately  if symptoms less severe.  You must read complete instructions/literature along with all the possible adverse reactions/side effects for all the Medicines you take and that have been prescribed to you. Take any new Medicines after you have completely understood and accpet all the possible adverse reactions/side effects.   Do not drive when taking Pain medications or sleeping medications (Benzodiazepines)  Do not take more than prescribed Pain,  Sleep and Anxiety Medications. It is not advisable to combine anxiety,sleep and pain medications without talking with your primary care practitioner  Special Instructions: If you have smoked or chewed Tobacco  in the last 2 yrs please stop smoking, stop any regular Alcohol  and or any Recreational drug use.  Wear Seat belts while driving.

## 2018-07-25 NOTE — Discharge Summary (Signed)
Physician Discharge Summary  [name] UEA:[id number] DOB: 09-21-1965 DOA: 07/22/2018  PCP: Pearson Grippe, MD Cardiologist: Dr. Purvis Sheffield   Admit date: 07/22/2018 Discharge date: 07/25/2018  Admitted From: Home  Disposition: Home   Recommendations for Outpatient Follow-up:  1. Follow up with PCP in 1 weeks 2. Follow up with cardiologist as scheduled on 08/19/18 3. Please arrange for outpatient sleep study.    Discharge Condition: STABLE   CODE STATUS: FULL    Brief Hospitalization Summary: Please see all hospital notes, images, labs for full details of the hospitalization. HPI: [name] is a 53 y.o. male with medical history significant of allergic rhinitis, asthma, atrial fibrillation, chronic systolic CHF, hypertension, GERD, CAD, history of MI, with normal coronaries in December 2016, treatment noncompliance, OSA, osteoarthritis, PUD who is coming to the emergency department with complaints of progressively worse dyspnea for the past 2 to 3 days associated with orthopnea, lower extremity edema and a 12 to 13 pound gain over the last 7 days.  He has been sleeping on a recliner.  He denies fever, chills, sore throat, chest pain, dizziness, diaphoresis, abdominal pain, nausea or emesis, diarrhea or constipation, melena or hematochezia.  No dysuria, frequency or hematuria.  Denies polyuria, polydipsia, polyphagia or blurred vision.  ED Course: Initial temperature was 98.3 F, pulse 104, respirations 28, blood pressure 150/90 mmHg and O2 sat 96% on room air.  He received furosemide 80 mg IVP x1 and K-Dur 40 mEq p.o. x1.  CBC shows a hemoglobin of 12.5 g/dL, all other values are within normal limits.  CMP shows a potassium of 3.4 and calcium of 8.4 mg/dL.  All other values within normal limits.  Troponin 0 0.04 ng/mL.  BNP 166.0 pg/mL.  Magnesium was 2.1 and phosphorus 3.8 mg/dL.  Brief Admission Hx: 52 y.o.malewith medical history significant ofallergic rhinitis, asthma, atrial  fibrillation, chronic systolic CHF, hypertension, GERD, CAD, history of MI, with normal coronaries in December 2016, treatment noncompliance, OSA, osteoarthritis, PUD who is coming to the emergency department with complaints of progressively worse dyspneafor the past 2 to 3 days associated with orthopnea, lower extremity edema and a 12 to 13 pound gain over the last 7 days.  MDM/Assessment & Plan:   1. Acute on chronic diastolic heart failure - due to noncompliance with diet and medications, he is diuresing well on IV lasix. Continue daily weights, strict I/Os, monitor electrolytes.  He reports feeling better today.  He has diuresed nearly 10 liters and is feeling much better now.  He wants to go home.  He already has an appt scheduled with his cardiologist for 08/19/18.  Resume his home diuretic therapy.  No changes made until he can see his cardiologist.  Resume bumex 2 mg BID plus spironolactone 100 mg daily.   2. Hyperlipidemia - continue atorvastatin 40 mg daily.   3. Essential hypertension -better controlled with diuresis, he has been restarted on his home carvedilol and lisinopril.  4. GERD - pantoprazole 40 mg daily.  5. Chronic atrial fibrillation - continue carvedilol for rate control and xarelto for full anticoagulation.  6. Normocytic anemia - Following.  7. Morbid obesity - outpatient referral for gastric bypass recommended.  8. Elevated troponin - demand ischemia in setting of acute heart failure.  No chest pain symptoms.  9. OSA - nightly CPAP in hospital.  I have asked patient to see his PCP about getting a replacement cpap as he may need to have another sleep study done.    DVT  prophylaxis: Xarelto Code Status: Full  Family Communication: patient  Disposition Plan: home   [facility] Weights   07/22/18 1513 07/24/18 0655  Weight: (!) 170.1 kg (!) 170.9 kg      Consultants:  n/a  Procedures:  Echocardiogram 04/01/18 Study Conclusions - Left ventricle: The cavity size  was normal. Wall thickness wasincreased in a pattern of moderate LVH. The estimated ejectionfraction was 55%. Wall motion was normal; there were no regionalwall motion abnormalities. The study was not technicallysufficient to allow evaluation of LV diastolic dysfunction due toatrial fibrillation. - Aortic valve: Mildly calcified annulus. Trileaflet. - Mitral valve: Mildly thickened leaflets. There was trivialregurgitation. - Left atrium: The atrium was moderately to severely dilated. - Right atrium: Central venous pressure (est): 3 mm Hg. - Tricuspid valve: There was trivial regurgitation. - Pulmonary arteries: Systolic pressure could not be accuratelyestimated. - Pericardium, extracardiac: A trivial pericardial effusion wasidentified.  Antimicrobials:  n/a   Discharge Diagnoses:  Principal Problem:   Volume overload Active Problems:   Hyperlipidemia   Essential hypertension   GERD (gastroesophageal reflux disease)   Hypokalemia   Atrial fibrillation, chronic   Normocytic anemia   Morbid obesity due to excess calories (HCC)   Elevated troponin   Acute on chronic diastolic CHF (congestive heart failure) (HCC)   Acute on chronic combined systolic and diastolic heart failure [facility])   Discharge Instructions: Discharge Instructions    (HEART FAILURE PATIENTS) Call MD:  Anytime you have any of the following symptoms: 1) 3 pound weight gain in 24 hours or 5 pounds in 1 week 2) shortness of breath, with or without a dry hacking cough 3) swelling in the hands, feet or stomach 4) if you have to sleep on extra pillows at night in order to breathe.   Complete by:  As directed    Call MD for:  difficulty breathing, headache or visual disturbances   Complete by:  As directed    Call MD for:  extreme fatigue   Complete by:  As directed    Call MD for:  persistant dizziness or light-headedness   Complete by:  As directed    Call MD for:  persistant nausea and vomiting   Complete  by:  As directed    Diet - low sodium heart healthy   Complete by:  As directed    Increase activity slowly   Complete by:  As directed      Allergies as of 07/25/2018      Reactions   Banana Shortness Of Breath   Bee Venom Shortness Of Breath, Swelling, Other (See Comments)   Reaction:  Facial swelling   Food Shortness Of Breath, Rash, Other (See Comments)   Pt states that he is allergic to strawberries.     Aspirin Other (See Comments)   Reaction:  GI upset    Metolazone Other (See Comments)   Pt states that he stopped taking this med due to heart attack like symptoms.    Oatmeal Nausea And Vomiting   Orange Juice [orange Oil] Nausea And Vomiting   All acidic products make him nauseous and upset stomach   Torsemide Swelling, Other (See Comments)   Reaction:  Swelling of feet/legs    Diltiazem Palpitations   Hydralazine Palpitations   Lipitor [atorvastatin] Other (See Comments)   Reaction:  Nose bleeds       Medication List    TAKE these medications   albuterol (2.5 MG/3ML) 0.083% nebulizer solution Commonly known as:  PROVENTIL Take 2.5 mg  by nebulization every 6 (six) hours as needed for wheezing or shortness of breath.   aspirin 81 MG chewable tablet Chew 1 tablet (81 mg total) by mouth daily.   atorvastatin 40 MG tablet Commonly known as:  LIPITOR Take 1 tablet (40 mg total) by mouth daily at 6 PM.   bumetanide 2 MG tablet Commonly known as:  BUMEX Take 2 mg by mouth 3 (three) times daily.   carvedilol 25 MG tablet Commonly known as:  COREG Take 1.5 tablets (37.5 mg total) by mouth 2 (two) times daily with a meal. What changed:  additional instructions   docusate sodium 100 MG capsule Commonly known as:  COLACE Take 1 capsule (100 mg total) by mouth 2 (two) times daily.   ferrous gluconate 324 MG tablet Commonly known as:  FERGON Take 1 tablet (324 mg total) by mouth 2 (two) times daily with a meal.   fluticasone 220 MCG/ACT inhaler Commonly known as:   FLOVENT HFA Inhale 2 puffs into the lungs 2 (two) times daily.   indomethacin 25 MG capsule Commonly known as:  INDOCIN Take 1 capsule (25 mg total) by mouth 3 (three) times daily as needed (as needed for gout attack). Take one capsule by mouth three times a day for acute gout. Stop as soon as gout attack is over. What changed:    when to take this  reasons to take this   OXYGEN Inhale 3.5 L into the lungs daily.   pantoprazole 40 MG tablet Commonly known as:  PROTONIX Take 1 tablet (40 mg total) by mouth daily at 6 (six) AM. What changed:  when to take this   potassium chloride SA 20 MEQ tablet Commonly known as:  K-DUR,KLOR-CON Take 1 tablet (20 mEq total) by mouth 2 (two) times daily.   rivaroxaban 20 MG Tabs tablet Commonly known as:  XARELTO Take 1 tablet (20 mg total) by mouth daily with breakfast. What changed:  when to take this   spironolactone 100 MG tablet Commonly known as:  ALDACTONE Take 100 mg by mouth daily. For fluid   tamsulosin 0.4 MG Caps capsule Commonly known as:  FLOMAX Take 1 capsule (0.4 mg total) by mouth daily.      Follow-up Information    Pearson GrippeKim, [name], MD. Schedule an appointment as soon as possible for a visit in 1 week(s).   Specialty:  Internal Medicine Why:  Hospital Follow Up Contact information: [address] [place] [address] [phone]        Laqueta LindenKoneswaran, Suresh A, MD. Go on 08/19/2018.   Specialty:  Cardiology Why:  10:40 am as already scheduled  Contact information: [address] [place] [address] [phone]          Allergies  Allergen Reactions  . Banana Shortness Of Breath  . Bee Venom Shortness Of Breath, Swelling and Other (See Comments)    Reaction:  Facial swelling  . Food Shortness Of Breath, Rash and Other (See Comments)    Pt states that he is allergic to strawberries.    . Aspirin Other (See Comments)    Reaction:  GI upset   . Metolazone Other (See Comments)    Pt states  that he stopped taking this med due to heart attack like symptoms.   . Oatmeal Nausea And Vomiting  . Orange Juice [Orange Oil] Nausea And Vomiting    All acidic products make him nauseous and upset stomach  . Torsemide Swelling and Other (See Comments)  Reaction:  Swelling of feet/legs   . Diltiazem Palpitations  . Hydralazine Palpitations  . Lipitor [Atorvastatin] Other (See Comments)    Reaction:  Nose bleeds    Allergies as of 07/25/2018      Reactions   Banana Shortness Of Breath   Bee Venom Shortness Of Breath, Swelling, Other (See Comments)   Reaction:  Facial swelling   Food Shortness Of Breath, Rash, Other (See Comments)   Pt states that he is allergic to strawberries.     Aspirin Other (See Comments)   Reaction:  GI upset    Metolazone Other (See Comments)   Pt states that he stopped taking this med due to heart attack like symptoms.    Oatmeal Nausea And Vomiting   Orange Juice [orange Oil] Nausea And Vomiting   All acidic products make him nauseous and upset stomach   Torsemide Swelling, Other (See Comments)   Reaction:  Swelling of feet/legs    Diltiazem Palpitations   Hydralazine Palpitations   Lipitor [atorvastatin] Other (See Comments)   Reaction:  Nose bleeds       Medication List    TAKE these medications   albuterol (2.5 MG/3ML) 0.083% nebulizer solution Commonly known as:  PROVENTIL Take 2.5 mg by nebulization every 6 (six) hours as needed for wheezing or shortness of breath.   aspirin 81 MG chewable tablet Chew 1 tablet (81 mg total) by mouth daily.   atorvastatin 40 MG tablet Commonly known as:  LIPITOR Take 1 tablet (40 mg total) by mouth daily at 6 PM.   bumetanide 2 MG tablet Commonly known as:  BUMEX Take 2 mg by mouth 3 (three) times daily.   carvedilol 25 MG tablet Commonly known as:  COREG Take 1.5 tablets (37.5 mg total) by mouth 2 (two) times daily with a meal. What changed:  additional instructions   docusate sodium 100 MG  capsule Commonly known as:  COLACE Take 1 capsule (100 mg total) by mouth 2 (two) times daily.   ferrous gluconate 324 MG tablet Commonly known as:  FERGON Take 1 tablet (324 mg total) by mouth 2 (two) times daily with a meal.   fluticasone 220 MCG/ACT inhaler Commonly known as:  FLOVENT HFA Inhale 2 puffs into the lungs 2 (two) times daily.   indomethacin 25 MG capsule Commonly known as:  INDOCIN Take 1 capsule (25 mg total) by mouth 3 (three) times daily as needed (as needed for gout attack). Take one capsule by mouth three times a day for acute gout. Stop as soon as gout attack is over. What changed:    when to take this  reasons to take this   OXYGEN Inhale 3.5 L into the lungs daily.   pantoprazole 40 MG tablet Commonly known as:  PROTONIX Take 1 tablet (40 mg total) by mouth daily at 6 (six) AM. What changed:  when to take this   potassium chloride SA 20 MEQ tablet Commonly known as:  K-DUR,KLOR-CON Take 1 tablet (20 mEq total) by mouth 2 (two) times daily.   rivaroxaban 20 MG Tabs tablet Commonly known as:  XARELTO Take 1 tablet (20 mg total) by mouth daily with breakfast. What changed:  when to take this   spironolactone 100 MG tablet Commonly known as:  ALDACTONE Take 100 mg by mouth daily. For fluid   tamsulosin 0.4 MG Caps capsule Commonly known as:  FLOMAX Take 1 capsule (0.4 mg total) by mouth daily.       Procedures/Studies: Dg  Chest 2 View  Result Date: 07/22/2018 CLINICAL DATA:  Shortness of breath for 2 days. EXAM: CHEST - 2 VIEW COMPARISON:  PA and lateral chest 07/21/2018 and 05/11/2017. Single-view of the chest 07/02/2018 and 09/03/2017 FINDINGS: Cardiomegaly and chronic vascular congestion are again seen. No consolidative process, pneumothorax or effusion. No acute or focal bony abnormality. IMPRESSION: Cardiomegaly and pulmonary vascular congestion. Electronically Signed   By: Drusilla Kanner M.D.   On: 07/22/2018 16:49   Dg Chest Port 1  View  Result Date: 07/25/2018 CLINICAL DATA:  Acute systolic CHF EXAM: PORTABLE CHEST 1 VIEW COMPARISON:  07/22/2018 FINDINGS: Chronic cardiomegaly and vascular pedicle widening. Vascular congestion without Kerley lines or effusion. No pneumothorax IMPRESSION: Cardiomegaly and vascular congestion. Electronically Signed   By: Marnee Spring M.D.   On: 07/25/2018 05:42   Dg Chest Port 1 View  Result Date: 07/02/2018 CLINICAL DATA:  Shortness of breath starting today. Lethargic but arousable. Lower extremity edema. EXAM: PORTABLE CHEST 1 VIEW COMPARISON:  06/27/2018 FINDINGS: Cardiac enlargement. Pulmonary vascular congestion with perihilar infiltration, likely edema. No blunting of costophrenic angles. No pneumothorax. Mediastinal contours appear intact. IMPRESSION: Cardiac enlargement with pulmonary vascular congestion and perihilar edema. Electronically Signed   By: Burman Nieves M.D.   On: 07/02/2018 21:41      Subjective: Pt says that he feels much better and he wants to go home today.    Discharge Exam: Vitals:   07/25/18 0045 07/25/18 0607  BP:  (!) 143/93  Pulse:  68  Resp: 18   Temp:  98.3 F (36.8 C)  SpO2: 96% 93%   Vitals:   07/24/18 1459 07/24/18 2151 07/25/18 0045 07/25/18 0607  BP: 103/72 (!) 94/55  (!) 143/93  Pulse: 62 68  68  Resp: 20  18   Temp: 98 F (36.7 C) 98.3 F (36.8 C)  98.3 F (36.8 C)  TempSrc: Oral Oral  Oral  SpO2: 100% 100% 96% 93%  Weight:      Height:       General exam: morbidly obese male, awake, alert, NAD, cooperative.  Respiratory system: persistent but less crackles at bilateral bases, distant sounds, hard to auscultate due to body habitus. No increased work of breathing. Cardiovascular system: normal S1 & S2 heard. Moderate JVD, No murmurs. Gastrointestinal system: Abdomen is nondistended, soft and nontender. Normal bowel sounds heard. Central nervous system: Alert and oriented. No focal neurological deficits. Extremities: 1+ pitting  edema bilateral LEs.  The results of significant diagnostics from this hospitalization (including imaging, microbiology, ancillary and laboratory) are listed below for reference.     Microbiology: No results found for this or any previous visit (from the past 240 hour(s)).   Labs: BNP (last 3 results) Recent Labs    06/11/18 0522 07/02/18 2132 07/22/18 1646  BNP 188.0* 190.0* 166.0*   Basic Metabolic Panel: Recent Labs  Lab 07/22/18 1646 07/23/18 0529 07/24/18 0512 07/25/18 0547  NA 139 140 139 142  K 3.4* 3.5 3.6 3.5  CL 104 105 104 106  CO2 28 29 28 28   GLUCOSE 92 91 101* 89  BUN 13 11 16 18   CREATININE 1.11 0.96 1.16 1.21  CALCIUM 8.4* 8.6* 8.4* 8.5*  MG 2.1  --  2.0 2.0  PHOS 3.8  --   --   --    Liver Function Tests: Recent Labs  Lab 07/22/18 1646  AST 18  ALT 14  ALKPHOS 65  BILITOT 1.0  PROT 6.6  ALBUMIN 3.6   No  results for input(s): LIPASE, AMYLASE in the last 168 hours. No results for input(s): AMMONIA in the last 168 hours. CBC: Recent Labs  Lab 07/22/18 1646 07/23/18 0529  WBC 4.2 4.9  NEUTROABS 2.6  --   HGB 12.5* 12.9*  HCT 40.3 42.2  MCV 89.0 90.8  PLT 191 202   Cardiac Enzymes: Recent Labs  Lab 07/22/18 1646 07/22/18 2221 07/23/18 0529 07/23/18 1054  TROPONINI 0.04* 0.03* 0.03* 0.03*   BNP: Invalid input(s): POCBNP CBG: No results for input(s): GLUCAP in the last 168 hours. D-Dimer No results for input(s): DDIMER in the last 72 hours. Hgb A1c No results for input(s): HGBA1C in the last 72 hours. Lipid Profile No results for input(s): CHOL, HDL, LDLCALC, TRIG, CHOLHDL, LDLDIRECT in the last 72 hours. Thyroid function studies No results for input(s): TSH, T4TOTAL, T3FREE, THYROIDAB in the last 72 hours.  Invalid input(s): FREET3 Anemia work up No results for input(s): VITAMINB12, FOLATE, FERRITIN, TIBC, IRON, RETICCTPCT in the last 72 hours. Urinalysis    Component Value Date/Time   COLORURINE YELLOW 04/01/2018 0332    APPEARANCEUR CLEAR 04/01/2018 0332   LABSPEC 1.008 04/01/2018 0332   PHURINE 7.0 04/01/2018 0332   GLUCOSEU NEGATIVE 04/01/2018 0332   GLUCOSEU NEG mg/dL 81/77/1165 7903   HGBUR NEGATIVE 04/01/2018 0332   BILIRUBINUR NEGATIVE 04/01/2018 0332   KETONESUR NEGATIVE 04/01/2018 0332   PROTEINUR NEGATIVE 04/01/2018 0332   UROBILINOGEN 0.2 08/08/2014 1445   NITRITE NEGATIVE 04/01/2018 0332   LEUKOCYTESUR NEGATIVE 04/01/2018 0332   Sepsis Labs Invalid input(s): PROCALCITONIN,  WBC,  LACTICIDVEN Microbiology No results found for this or any previous visit (from the past 240 hour(s)).  Time coordinating discharge: 34 minutes   SIGNED:  Standley Dakins, MD  [facility] 07/25/2018, 9:30 AM How to contact the [facility] Attending or Consulting provider 7A - 7P or covering provider during after hours 7P -7A, for this patient?  1. Check the care team in [facility] and look for a) attending/consulting TR[facility] provider listed and b) the [facility] team listed 2. Log into [email or web address] and use [place]'s universal password to access. If you do not have the password, please contact the hospital operator. 3. Locate the [facility] provider you are looking for under [facility] and page to a number that you can be directly reached. 4. If you still have difficulty reaching the provider, please page the [facility] (Director on Call) for the Hospitalists listed on amion for assistance.

## 2018-07-25 NOTE — Progress Notes (Signed)
Discharge instructions reviewed with patient. Given AVS. Verbalized understanding of instructions, heart failure education, states he has scale and monitors daily weights at home. IV site removed, site within normal limits. States he will call to schedule PCP appointment, and cardiology follow-up is already scheduled. Patient to have outpatient sleep study arranged by PCP per discussion with Dr. Laural Benes. Pt in stable condition awaiting family arrival for discharge home. Earnstine Regal, RN

## 2018-07-25 NOTE — Care Management Note (Addendum)
Case Management Note  Patient Details  Name: [name] MRN: [id number] Date of Birth: 28-Nov-1965  Subjective/Objective:       CHF.              Action/Plan: Received call from [facility] community RN, patient has been referred to [employer] program with [facility].  Call to Tim with [facility] to see if they need any new orders .    Per Tim, no orders received for [name]. Order placed. Patient DC'd home.   Expected Discharge Date:  07/25/18               Expected Discharge Plan:  Home/Self Care  In-House Referral:     Discharge planning Services  CM Consult  Post Acute Care Choice:  NA Choice offered to:  NA  DME Arranged:    DME Agency:     HH Arranged:    HH Agency:     Status of Service:  Completed, signed off  If discussed at [employer] of Stay Meetings, dates discussed:    Additional Comments:  Axle Parfait, [name], RN 07/25/2018, 10:31 AM

## 2018-07-26 ENCOUNTER — Other Ambulatory Visit: Payer: Self-pay

## 2018-07-26 NOTE — Patient Outreach (Signed)
[facility]) Care Management  07/26/2018  [name] 1966/05/27 [id number]   Attempted outreach with [name]. Call answered by member's mother. Reported that he was asleep at the time of the call. Left message requesting that he return call to [facility].  PLAN Pending call back from member.  Katha Cabal [facility] Community Care Manager [phone]

## 2018-07-27 DIAGNOSIS — Z9181 History of falling: Secondary | ICD-10-CM | POA: Diagnosis not present

## 2018-07-27 DIAGNOSIS — I2510 Atherosclerotic heart disease of native coronary artery without angina pectoris: Secondary | ICD-10-CM | POA: Diagnosis not present

## 2018-07-27 DIAGNOSIS — I252 Old myocardial infarction: Secondary | ICD-10-CM | POA: Diagnosis not present

## 2018-07-27 DIAGNOSIS — I110 Hypertensive heart disease with heart failure: Secondary | ICD-10-CM | POA: Diagnosis not present

## 2018-07-27 DIAGNOSIS — Z7901 Long term (current) use of anticoagulants: Secondary | ICD-10-CM | POA: Diagnosis not present

## 2018-07-27 DIAGNOSIS — J45909 Unspecified asthma, uncomplicated: Secondary | ICD-10-CM | POA: Diagnosis not present

## 2018-07-27 DIAGNOSIS — G4733 Obstructive sleep apnea (adult) (pediatric): Secondary | ICD-10-CM | POA: Diagnosis not present

## 2018-07-27 DIAGNOSIS — I5043 Acute on chronic combined systolic (congestive) and diastolic (congestive) heart failure: Secondary | ICD-10-CM | POA: Diagnosis not present

## 2018-07-27 DIAGNOSIS — K279 Peptic ulcer, site unspecified, unspecified as acute or chronic, without hemorrhage or perforation: Secondary | ICD-10-CM | POA: Diagnosis not present

## 2018-07-27 DIAGNOSIS — I482 Chronic atrial fibrillation, unspecified: Secondary | ICD-10-CM | POA: Diagnosis not present

## 2018-07-27 DIAGNOSIS — E785 Hyperlipidemia, unspecified: Secondary | ICD-10-CM | POA: Diagnosis not present

## 2018-07-27 DIAGNOSIS — M1991 Primary osteoarthritis, unspecified site: Secondary | ICD-10-CM | POA: Diagnosis not present

## 2018-07-27 DIAGNOSIS — D649 Anemia, unspecified: Secondary | ICD-10-CM | POA: Diagnosis not present

## 2018-07-27 DIAGNOSIS — K219 Gastro-esophageal reflux disease without esophagitis: Secondary | ICD-10-CM | POA: Diagnosis not present

## 2018-07-27 DIAGNOSIS — Z9981 Dependence on supplemental oxygen: Secondary | ICD-10-CM | POA: Diagnosis not present

## 2018-07-27 NOTE — Telephone Encounter (Signed)
I have called several times and left messages for the intake coordinator to call me back concerning the [employer] program through [facility]. I have not heard anything form them thus far. I will let Felicia know that Dr. Purvis Sheffield is okay with the program, however I am unable to get intouch with the coordinator.

## 2018-07-28 ENCOUNTER — Other Ambulatory Visit: Payer: Self-pay

## 2018-07-28 ENCOUNTER — Telehealth: Payer: Self-pay

## 2018-07-28 DIAGNOSIS — G4733 Obstructive sleep apnea (adult) (pediatric): Secondary | ICD-10-CM | POA: Diagnosis not present

## 2018-07-28 DIAGNOSIS — I10 Essential (primary) hypertension: Secondary | ICD-10-CM | POA: Diagnosis not present

## 2018-07-28 DIAGNOSIS — E785 Hyperlipidemia, unspecified: Secondary | ICD-10-CM | POA: Diagnosis not present

## 2018-07-28 DIAGNOSIS — E611 Iron deficiency: Secondary | ICD-10-CM | POA: Diagnosis not present

## 2018-07-28 DIAGNOSIS — D649 Anemia, unspecified: Secondary | ICD-10-CM | POA: Diagnosis not present

## 2018-07-28 NOTE — Telephone Encounter (Signed)
Patient too large for red Vest.CHF program will follow patient and be able to get home IV diuretics, coordinator is Efraim Kaufmann [phone],  Fax [phone]  Will FYI Dr.Koneswaran

## 2018-07-28 NOTE — Patient Outreach (Signed)
[facility]) Care Management  07/28/2018  [name] 1966/05/17 [id number]  Unsuccessful outreach attempt. Phone rang several times without option to leave a voice message.  PLAN Will attempt to reach member within 3-4 business days.   Katha Cabal [facility] Community Care Manager [phone]

## 2018-07-29 ENCOUNTER — Ambulatory Visit: Payer: Self-pay

## 2018-08-01 ENCOUNTER — Ambulatory Visit: Payer: Self-pay

## 2018-08-01 ENCOUNTER — Other Ambulatory Visit: Payer: Self-pay

## 2018-08-01 ENCOUNTER — Telehealth: Payer: Self-pay | Admitting: Cardiovascular Disease

## 2018-08-01 NOTE — Telephone Encounter (Signed)
I would defer to advanced HF clinic for outpatient parameters regarding IV Lasix.

## 2018-08-01 NOTE — Telephone Encounter (Signed)
Please suggest home IV lasix instructions/ parameters for home health nurse

## 2018-08-01 NOTE — Telephone Encounter (Signed)
I will ask AHF clinic.I don't see that he goes there.

## 2018-08-01 NOTE — Patient Outreach (Addendum)
[facility]) Care Management  [facility]   08/01/2018  Samuel KlippelMark T Fitzpatrick 09/02/1965 [id number]  Follow up outreach to Mr. Samuel FaithArcher.   Outreach:  Successful telephone call with [name].  HIPAA identifiers verified.   Subjective:  [name] reports that his edema is getting worse and he states he will probably have to go to the ED this week again. He reports [facility], but "they didn't get enough fluid off".  He states [facility] and "they kept me for four days".  He was discharged on 07/25/18.  He states that he [facility] Friday and will see cardiology on 2/28.  He was seen by the [name] and awaiting their call this week.    Objective: Lab Results  Component Value Date   CREATININE 1.21 07/25/2018   CREATININE 1.16 07/24/2018   CREATININE 0.96 07/23/2018    Lab Results  Component Value Date   HGBA1C 5.7 (H) 04/04/2016    Lipid Panel     Component Value Date/Time   CHOL 139 06/14/2015 0328   TRIG 58 06/14/2015 0328   HDL 28 (Fitzpatrick) 06/14/2015 0328   CHOLHDL 5.0 06/14/2015 0328   VLDL 12 06/14/2015 0328   LDLCALC 99 06/14/2015 0328    BP Readings from Last 3 Encounters:  07/25/18 (!) 143/93  07/06/18 123/80  06/13/18 130/86    Allergies  Allergen Reactions  . Banana Shortness Of Breath  . Bee Venom Shortness Of Breath, Swelling and Other (See Comments)    Reaction:  Facial swelling  . Food Shortness Of Breath, Rash and Other (See Comments)    Pt states that he is allergic to strawberries.    . Aspirin Other (See Comments)    Reaction:  GI upset   . Metolazone Other (See Comments)    Pt states that he stopped taking this med due to heart attack like symptoms.   . Oatmeal Nausea And Vomiting  . Orange Juice [Orange Oil] Nausea And Vomiting    All acidic products make him nauseous and upset stomach  . Torsemide Swelling and Other (See Comments)    Reaction:  Swelling  of feet/legs   . Diltiazem Palpitations  . Hydralazine Palpitations  . Lipitor [Atorvastatin] Other (See Comments)    Reaction:  Nose bleeds     Medications Reviewed Today    Reviewed by [name], [facility] (Pharmacist) on 08/01/18 at [id number]  Med List Status: <None>  Medication Order Taking? Sig Documenting Provider Last Dose Status Informant  albuterol (PROVENTIL) (2.5 MG/3ML) 0.083% nebulizer solution [id number] Yes Take 2.5 mg by nebulization every 6 (six) hours as needed for wheezing or shortness of breath. Provider, Historical, Fitzpatrick Taking Active Pharmacy Records           Med Note Allyne Gee([name]   Thu Mar 04, 2017  8:53 PM)    aspirin 81 MG chewable tablet [id number] Yes Chew 1 tablet (81 mg total) by mouth daily. [name] Taking Active Pharmacy Records           Med Note ([name]   Fri Jul 01, 2018 12:27 PM)    atorvastatin (LIPITOR) 40 MG tablet [id number] Yes Take 1 tablet (40 mg total) by mouth daily at 6 PM. [name] Taking Active Pharmacy Records  bumetanide [facility](BUMEX) 2 MG tablet [id number] Yes Take 2 mg by mouth 3 (three) times daily. Provider, Historical,  Fitzpatrick Taking Active Pharmacy Records  carvedilol (COREG) 25 MG tablet [id number] Yes Take 1.5 tablets (37.5 mg total) by mouth 2 (two) times daily with a meal.  Patient taking differently:  Take 37.5 mg by mouth 2 (two) times daily with a meal. Patient reports he is only taking 1 tablet twice daily- 07/01/17   [name] Taking Active Pharmacy Records  docusate sodium (COLACE) 100 MG capsule [id number] Yes Take 1 capsule (100 mg total) by mouth 2 (two) times daily. [name] Taking Active Pharmacy Records  ferrous gluconate [facility](FERGON) 324 MG tablet [id number] Yes Take 1 tablet (324 mg total) by mouth 2 (two) times daily with a meal. [name] Taking Active Pharmacy Records  fluticasone [facility](FLOVENT [facility]) 220 MCG/ACT inhaler [id number] Yes Inhale 2 puffs into the lungs 2 (two) times  daily. Provider, Historical, Fitzpatrick Taking Active Pharmacy Records  indomethacin (INDOCIN) 25 MG capsule [id number] Yes Take 1 capsule (25 mg total) by mouth 3 (three) times daily as needed (as needed for gout attack). Take one capsule by mouth three times a day for acute gout. Stop as soon as gout attack is over. [name] Taking Active   OXYGEN [id number] Yes Inhale 3.5 Fitzpatrick into the lungs daily. Provider, Historical, Fitzpatrick Taking Active Pharmacy Records           Med Note [name]   Fri Jul 22, 2018  5:23 PM)    pantoprazole (PROTONIX) 40 MG tablet [id number] Yes Take 1 tablet (40 mg total) by mouth daily at 6 (six) AM.  Patient taking differently:  Take 40 mg by mouth every evening.    [name] Taking Active Pharmacy Records        Discontinued 09/03/15 [phone] (Entry Error)   potassium chloride SA (Fitzpatrick-DUR,KLOR-CON) 20 MEQ tablet [id number] Yes Take 1 tablet (20 mEq total) by mouth 2 (two) times daily. Maurilio LovelyShah, Pratik D, DO Taking Active Pharmacy Records  rivaroxaban (XARELTO) 20 MG TABS tablet [id number] Yes Take 1 tablet (20 mg total) by mouth daily with breakfast.  Patient taking differently:  Take 20 mg by mouth daily with supper.    [name] Taking Active Pharmacy Records           Med Note [name]   Fri Sep 17, 2017 11:23 AM) Taking in evening     spironolactone (ALDACTONE) 100 MG tablet [id number] Yes Take 100 mg by mouth daily. For fluid Provider, Historical, Fitzpatrick Taking Active Pharmacy Records  tamsulosin [facility](FLOMAX) 0.4 MG CAPS capsule [id number] Yes Take 1 capsule (0.4 mg total) by mouth daily. [name] Taking Active Pharmacy Records  Med List Note Landis Gandy(Gravley, Morgan Fitzpatrick 07/22/18 1615): Patient also uses [facility]          ASSESSMENT: Date Discharged from Hospital: 07/25/18 Date Medication Reconciliation Performed: 08/01/2018  Medications:  New at Discharge: . Indomethacin  Patient was recently discharged from hospital and all  medications have been reviewed.  Assessment:  Drugs sorted by system:  Cardiovascular: aspirin 81 mg, atorvastatin, bumetanide, carvedilol, potassium chloride, rivaroxaban, spironolactone  Pulmonary/Allergy: albuterol neb, fluticasone  Gastrointestinal: docusate sodium, pantoprazole  Pain: indomethacin  Genitourinary: tamsulosin  Vitamins/Minerals/Supplements: ferrous gluconate  Medication Review Findings:  . Concurrent use of indomethacin and rivaroxaban can increase the risk for bleeding.  . [name] states that he is taking all his medications as prescribed.   Plan: Call placed to [facility] RN, Juanell FairlyFelecia McCray to update her about my conversation with  Mr. Alyea.   In-basket message sent to Dr. Purvis Sheffield regarding indomethacin and rivaroxaban.  Will outreach to Mr. Caso next week.   [name], PharmD Clinical Pharmacist [facility] [phone]

## 2018-08-01 NOTE — Telephone Encounter (Signed)
Please [name] w/ [facility] a call @ [phone], wanting to set perimeters for IV Lasix

## 2018-08-02 ENCOUNTER — Other Ambulatory Visit: Payer: Self-pay

## 2018-08-02 NOTE — Patient Outreach (Signed)
[facility]) Care Management  08/02/2018  [name] 16-Jul-1965 [id number]   Unsuccessful outreach attempt. Phone rang multiple times with no answer. No option to leave voice message.   PLAN Will follow up with [facility] CHF nurse. Will attempt outreach with member later this week.  Katha Cabal [facility] Care Manager [phone]

## 2018-08-03 ENCOUNTER — Other Ambulatory Visit: Payer: Self-pay

## 2018-08-03 ENCOUNTER — Emergency Department (HOSPITAL_COMMUNITY): Payer: Medicare Other

## 2018-08-03 ENCOUNTER — Inpatient Hospital Stay (HOSPITAL_COMMUNITY)
Admission: EM | Admit: 2018-08-03 | Discharge: 2018-08-05 | DRG: 291 | Disposition: A | Discharge status: Home / Self Care | Payer: Medicare Other | Attending: Internal Medicine | Admitting: Internal Medicine

## 2018-08-03 ENCOUNTER — Encounter (HOSPITAL_COMMUNITY): Payer: Self-pay | Admitting: Emergency Medicine

## 2018-08-03 ENCOUNTER — Telehealth: Payer: Self-pay | Admitting: Cardiovascular Disease

## 2018-08-03 DIAGNOSIS — I130 Hypertensive heart and chronic kidney disease with heart failure and stage 1 through stage 4 chronic kidney disease, or unspecified chronic kidney disease: Secondary | ICD-10-CM | POA: Diagnosis not present

## 2018-08-03 DIAGNOSIS — Z8249 Family history of ischemic heart disease and other diseases of the circulatory system: Secondary | ICD-10-CM

## 2018-08-03 DIAGNOSIS — Z9981 Dependence on supplemental oxygen: Secondary | ICD-10-CM

## 2018-08-03 DIAGNOSIS — Z91018 Allergy to other foods: Secondary | ICD-10-CM

## 2018-08-03 DIAGNOSIS — K219 Gastro-esophageal reflux disease without esophagitis: Secondary | ICD-10-CM | POA: Diagnosis not present

## 2018-08-03 DIAGNOSIS — Z7951 Long term (current) use of inhaled steroids: Secondary | ICD-10-CM | POA: Diagnosis not present

## 2018-08-03 DIAGNOSIS — I2510 Atherosclerotic heart disease of native coronary artery without angina pectoris: Secondary | ICD-10-CM | POA: Diagnosis not present

## 2018-08-03 DIAGNOSIS — I482 Chronic atrial fibrillation, unspecified: Secondary | ICD-10-CM | POA: Diagnosis not present

## 2018-08-03 DIAGNOSIS — Z9119 Patient's noncompliance with other medical treatment and regimen: Secondary | ICD-10-CM

## 2018-08-03 DIAGNOSIS — F329 Major depressive disorder, single episode, unspecified: Secondary | ICD-10-CM | POA: Diagnosis present

## 2018-08-03 DIAGNOSIS — I509 Heart failure, unspecified: Secondary | ICD-10-CM

## 2018-08-03 DIAGNOSIS — Z9181 History of falling: Secondary | ICD-10-CM | POA: Diagnosis not present

## 2018-08-03 DIAGNOSIS — Z886 Allergy status to analgesic agent status: Secondary | ICD-10-CM | POA: Diagnosis not present

## 2018-08-03 DIAGNOSIS — I10 Essential (primary) hypertension: Secondary | ICD-10-CM | POA: Diagnosis present

## 2018-08-03 DIAGNOSIS — M1991 Primary osteoarthritis, unspecified site: Secondary | ICD-10-CM | POA: Diagnosis not present

## 2018-08-03 DIAGNOSIS — Z6841 Body Mass Index (BMI) 40.0 and over, adult: Secondary | ICD-10-CM | POA: Diagnosis not present

## 2018-08-03 DIAGNOSIS — Z8711 Personal history of peptic ulcer disease: Secondary | ICD-10-CM | POA: Diagnosis not present

## 2018-08-03 DIAGNOSIS — R0600 Dyspnea, unspecified: Secondary | ICD-10-CM | POA: Diagnosis not present

## 2018-08-03 DIAGNOSIS — J45909 Unspecified asthma, uncomplicated: Secondary | ICD-10-CM | POA: Diagnosis present

## 2018-08-03 DIAGNOSIS — Z87891 Personal history of nicotine dependence: Secondary | ICD-10-CM

## 2018-08-03 DIAGNOSIS — I428 Other cardiomyopathies: Secondary | ICD-10-CM | POA: Diagnosis not present

## 2018-08-03 DIAGNOSIS — Z7982 Long term (current) use of aspirin: Secondary | ICD-10-CM | POA: Diagnosis not present

## 2018-08-03 DIAGNOSIS — K279 Peptic ulcer, site unspecified, unspecified as acute or chronic, without hemorrhage or perforation: Secondary | ICD-10-CM | POA: Diagnosis not present

## 2018-08-03 DIAGNOSIS — I959 Hypotension, unspecified: Secondary | ICD-10-CM | POA: Diagnosis not present

## 2018-08-03 DIAGNOSIS — D649 Anemia, unspecified: Secondary | ICD-10-CM | POA: Diagnosis not present

## 2018-08-03 DIAGNOSIS — E662 Morbid (severe) obesity with alveolar hypoventilation: Secondary | ICD-10-CM | POA: Diagnosis present

## 2018-08-03 DIAGNOSIS — Z7901 Long term (current) use of anticoagulants: Secondary | ICD-10-CM

## 2018-08-03 DIAGNOSIS — I252 Old myocardial infarction: Secondary | ICD-10-CM | POA: Diagnosis not present

## 2018-08-03 DIAGNOSIS — Z888 Allergy status to other drugs, medicaments and biological substances status: Secondary | ICD-10-CM | POA: Diagnosis not present

## 2018-08-03 DIAGNOSIS — Z91030 Bee allergy status: Secondary | ICD-10-CM

## 2018-08-03 DIAGNOSIS — Z79899 Other long term (current) drug therapy: Secondary | ICD-10-CM

## 2018-08-03 DIAGNOSIS — N182 Chronic kidney disease, stage 2 (mild): Secondary | ICD-10-CM | POA: Diagnosis not present

## 2018-08-03 DIAGNOSIS — I110 Hypertensive heart disease with heart failure: Secondary | ICD-10-CM | POA: Diagnosis not present

## 2018-08-03 DIAGNOSIS — M1990 Unspecified osteoarthritis, unspecified site: Secondary | ICD-10-CM | POA: Diagnosis not present

## 2018-08-03 DIAGNOSIS — G4733 Obstructive sleep apnea (adult) (pediatric): Secondary | ICD-10-CM | POA: Diagnosis not present

## 2018-08-03 DIAGNOSIS — I5043 Acute on chronic combined systolic (congestive) and diastolic (congestive) heart failure: Secondary | ICD-10-CM | POA: Diagnosis not present

## 2018-08-03 DIAGNOSIS — R079 Chest pain, unspecified: Secondary | ICD-10-CM | POA: Diagnosis not present

## 2018-08-03 DIAGNOSIS — E785 Hyperlipidemia, unspecified: Secondary | ICD-10-CM | POA: Diagnosis not present

## 2018-08-03 DIAGNOSIS — R0602 Shortness of breath: Secondary | ICD-10-CM | POA: Diagnosis not present

## 2018-08-03 DIAGNOSIS — R609 Edema, unspecified: Secondary | ICD-10-CM | POA: Diagnosis not present

## 2018-08-03 LAB — CBC WITH DIFFERENTIAL/PLATELET
Abs Immature Granulocytes: 0.02 10*3/uL (ref 0.00–0.07)
Basophils Absolute: 0 10*3/uL (ref 0.0–0.1)
Basophils Relative: 0 %
Eosinophils Absolute: 0.1 10*3/uL (ref 0.0–0.5)
Eosinophils Relative: 3 %
HCT: 39.6 % (ref 39.0–52.0)
HEMOGLOBIN: 12.4 g/dL — AB (ref 13.0–17.0)
Immature Granulocytes: 0 %
Lymphocytes Relative: 19 %
Lymphs Abs: 0.9 10*3/uL (ref 0.7–4.0)
MCH: 28.2 pg (ref 26.0–34.0)
MCHC: 31.3 g/dL (ref 30.0–36.0)
MCV: 90.2 fL (ref 80.0–100.0)
MONO ABS: 0.4 10*3/uL (ref 0.1–1.0)
Monocytes Relative: 8 %
Neutro Abs: 3.1 10*3/uL (ref 1.7–7.7)
Neutrophils Relative %: 70 %
Platelets: 169 10*3/uL (ref 150–400)
RBC: 4.39 MIL/uL (ref 4.22–5.81)
RDW: 14 % (ref 11.5–15.5)
WBC: 4.5 10*3/uL (ref 4.0–10.5)
nRBC: 0 % (ref 0.0–0.2)

## 2018-08-03 LAB — COMPREHENSIVE METABOLIC PANEL
ALT: 14 U/L (ref 0–44)
AST: 17 U/L (ref 15–41)
Albumin: 3.8 g/dL (ref 3.5–5.0)
Alkaline Phosphatase: 64 U/L (ref 38–126)
Anion gap: 6 (ref 5–15)
BILIRUBIN TOTAL: 1.4 mg/dL — AB (ref 0.3–1.2)
BUN: 14 mg/dL (ref 6–20)
CO2: 24 mmol/L (ref 22–32)
Calcium: 8.5 mg/dL — ABNORMAL LOW (ref 8.9–10.3)
Chloride: 108 mmol/L (ref 98–111)
Creatinine, Ser: 1.18 mg/dL (ref 0.61–1.24)
GFR calc Af Amer: >60 mL/min (ref >60)
GFR calc non Af Amer: >60 mL/min (ref >60)
Glucose, Bld: 118 mg/dL — ABNORMAL HIGH (ref 70–99)
Potassium: 3.5 mmol/L (ref 3.5–5.1)
Sodium: 138 mmol/L (ref 135–145)
Total Protein: 6.9 g/dL (ref 6.5–8.1)

## 2018-08-03 LAB — TROPONIN I: Troponin I: <0.03 ng/mL (ref <0.03)

## 2018-08-03 LAB — BRAIN NATRIURETIC PEPTIDE: B Natriuretic Peptide: 346 pg/mL — ABNORMAL HIGH (ref 0.0–100.0)

## 2018-08-03 MED ORDER — POTASSIUM CHLORIDE CRYS ER 20 MEQ PO TBCR
40.0000 meq | EXTENDED_RELEASE_TABLET | Freq: Once | ORAL | Status: AC
  Administered 2018-08-03: 40 meq via ORAL
  Filled 2018-08-03: qty 2

## 2018-08-03 MED ORDER — FUROSEMIDE 10 MG/ML IJ SOLN
60.0000 mg | Freq: Two times a day (BID) | INTRAMUSCULAR | Status: DC
  Administered 2018-08-04: 60 mg via INTRAVENOUS
  Filled 2018-08-03: qty 6

## 2018-08-03 MED ORDER — FUROSEMIDE 10 MG/ML IJ SOLN
80.0000 mg | Freq: Once | INTRAMUSCULAR | Status: AC
  Administered 2018-08-03: 80 mg via INTRAVENOUS
  Filled 2018-08-03: qty 8

## 2018-08-03 NOTE — Telephone Encounter (Signed)
Home Health Nurse called and stated patient had gained 5 lbs today, BP 160/98, HR 108. I have not heard anything from [facility] yet.They want to know what he should take as far as home medications tonight.She then said patient wants his son to take him to the ED.

## 2018-08-03 NOTE — ED Triage Notes (Signed)
Bilateral lower leg edema, dyspnea on exertion.

## 2018-08-03 NOTE — Telephone Encounter (Signed)
[facility] calling to report 5 lb weight gain, elevated BP and SOB. Please advise of instructions/tg

## 2018-08-03 NOTE — ED Notes (Signed)
Pt reports not taking all his medications today.

## 2018-08-03 NOTE — H&P (Signed)
History and Physical    [name]:[id number] DOB: 1966-02-12 DOA: 08/03/2018  PCP: Pearson Grippe, MD   Patient coming from: Home  I have personally briefly reviewed patient's old medical records in [facility]  Chief Complaint: SOB  HPI: [name] is a 53 y.o. male with medical history significant for systolic CHF, nonischemic cardiomyopathy, atrial fibrillation on chronic anticoagulation, OHS, HTN, depression, obesity BMI 50, to the ED with complaints of difficulty breathing, and 6 to 7 pound weight gain over the past few days.  Reports some central chest pain today that resolved after 325 mg aspirin given.  Patient endorses compliance with his medication, but he says it is not effective.  At baseline he sleeps in a recliner.  Multiple prior admissions for same, most recent 1/31- 2/3.  Patients home health nurse saw patient today and [facility] that patient had gained 5 pounds, patient decided to come to ED.  ED Course: Initial tachycardia to 109, tachypneic to 28, blood pressure systolic 120s to 245Y, to sats greater than 93% on room air.  BNP elevated 346 compared to prior admissions for same.  Troponin less than 0.03.  Creatinine about baseline 1.1.  Two-view chest x-ray mild pulmonary edema, atypical infection not excluded.  EKG shows atrial fibrillation rate 104, QTc 477.  Unchanged from prior. Patient given 80mg  IV of Lasix.  Hospitalist to admit for decompensated CHF.  Review of Systems: As per HPI all other systems reviewed and negative.  Past Medical History:  Diagnosis Date  . Allergic rhinitis   . Asthma   . Atrial fibrillation (HCC)   . CHF (congestive heart failure) (HCC)   . Essential hypertension   . Gastroesophageal reflux disease   . Heart attack (HCC)   . History of cardiac catheterization    Normal coronaries December 2016  . Noncompliance    Major problem leading to declining health and recurrent hospitalization  . Nonischemic  cardiomyopathy (HCC)    LVEF 20-25%  . On home O2    3L N/C  . OSA (obstructive sleep apnea)   . Osteoarthritis   . Peptic ulcer disease     Past Surgical History:  Procedure Laterality Date  . CARDIAC CATHETERIZATION N/A 06/14/2015   Procedure: Right/Left Heart Cath and Coronary Angiography;  Surgeon: Runell Gess, MD;  Location[facility] INVASIVE CV LAB;  Service: Cardiovascular;  Laterality: N/A;     reports that he quit smoking about 11 years ago. His smoking use included cigarettes. He started smoking about 30 years ago. He has a 10.00 pack-year smoking history. He has never used smokeless tobacco. He reports that he does not drink alcohol or use drugs.  Allergies  Allergen Reactions  . Banana Shortness Of Breath  . Bee Venom Shortness Of Breath, Swelling and Other (See Comments)    Reaction:  Facial swelling  . Food Shortness Of Breath, Rash and Other (See Comments)    Pt states that he is allergic to strawberries.    . Aspirin Other (See Comments)    Reaction:  GI upset   . Metolazone Other (See Comments)    Pt states that he stopped taking this med due to heart attack like symptoms.   . Oatmeal Nausea And Vomiting  . Orange Juice [Orange Oil] Nausea And Vomiting    All acidic products make him nauseous and upset stomach  . Torsemide Swelling and Other (See Comments)    Reaction:  Swelling of feet/legs   .  Diltiazem Palpitations  . Hydralazine Palpitations  . Lipitor [Atorvastatin] Other (See Comments)    Reaction:  Nose bleeds     Family History  Problem Relation Age of Onset  . Stroke Father   . Heart attack Father   . Aneurysm Mother        Cerebral aneurysm  . Hypertension Sister   . Colon cancer Neg Hx   . Inflammatory bowel disease Neg Hx   . Liver disease Neg Hx     Prior to Admission medications   Medication Sig Start Date End Date Taking? Authorizing Provider  albuterol (PROVENTIL) (2.5 MG/3ML) 0.083% nebulizer solution Take 2.5 mg by nebulization  every 6 (six) hours as needed for wheezing or shortness of breath.   Yes [provider]  aspirin 81 MG chewable tablet Chew 1 tablet (81 mg total) by mouth daily. 04/13/17  Yes Erick Blinks, MD  atorvastatin (LIPITOR) 40 MG tablet Take 1 tablet (40 mg total) by mouth daily at 6 PM. 06/25/16  Yes Kathlen Mody, MD  beclomethasone (QVAR) 40 MCG/ACT inhaler Inhale 2 puffs into the lungs daily as needed (for shortness of breath).   Yes [provider]  bumetanide (BUMEX) 2 MG tablet Take 2 mg by mouth 3 (three) times daily.   Yes [provider]  carvedilol (COREG) 25 MG tablet Take 1.5 tablets (37.5 mg total) by mouth 2 (two) times daily with a meal. Patient taking differently: Take 37.5 mg by mouth 2 (two) times daily with a meal. Patient reports he is only taking 1 tablet twice daily- 07/01/17 10/07/17  Yes [name], MD  docusate sodium (COLACE) 100 MG capsule Take 1 capsule (100 mg total) by mouth 2 (two) times daily. 06/25/16  Yes Kathlen Mody, MD  ferrous gluconate (FERGON) 324 MG tablet Take 1 tablet (324 mg total) by mouth 2 (two) times daily with a meal. 06/25/16  Yes Kathlen Mody, MD  fluticasone (FLOVENT HFA) 220 MCG/ACT inhaler Inhale 2 puffs into the lungs 2 (two) times daily.   Yes [provider]  OXYGEN Inhale 3.5 L into the lungs daily.   Yes [provider]  pantoprazole (PROTONIX) 40 MG tablet Take 1 tablet (40 mg total) by mouth daily at 6 (six) AM. Patient taking differently: Take 40 mg by mouth every evening.  06/26/16  Yes Kathlen Mody, MD  potassium chloride SA (K-DUR,KLOR-CON) 20 MEQ tablet Take 1 tablet (20 mEq total) by mouth 2 (two) times daily. 06/13/18  Yes Sherryll Burger, Pratik D, DO  rivaroxaban (XARELTO) 20 MG TABS tablet Take 1 tablet (20 mg total) by mouth daily with breakfast. Patient taking differently: Take 20 mg by mouth daily with supper.  06/26/16  Yes Kathlen Mody, MD  spironolactone (ALDACTONE) 100 MG tablet Take 100  mg by mouth daily. For fluid 03/18/17  Yes [provider]  tamsulosin (FLOMAX) 0.4 MG CAPS capsule Take 1 capsule (0.4 mg total) by mouth daily. 06/25/16  Yes Kathlen Mody, MD  indomethacin (INDOCIN) 25 MG capsule Take 1 capsule (25 mg total) by mouth 3 (three) times daily as needed (as needed for gout attack). Take one capsule by mouth three times a day for acute gout. Stop as soon as gout attack is over. Patient not taking: Reported on 08/03/2018 07/25/18   Cleora Fleet, MD  potassium chloride 20 MEQ TBCR Take 20 mEq by mouth 2 (two) times daily. 09/03/15 09/03/15  Ward, Layla Maw, DO    Physical Exam: Vitals:   08/03/18  1830 08/03/18 1900 08/03/18 1930 08/03/18 2000  BP: (!) 157/96 (!) 156/100 (!) 128/114 (!) 153/110  Pulse: 97 92 (!) 54   Resp: (!) 26 (!) 25 (!) 25 (!) 22  Temp:      TempSrc:      SpO2: 95% 96% 96%   Weight:      Height:        Constitutional: Sitting upright in bed, calm, comfortable Vitals:   08/03/18 1830 08/03/18 1900 08/03/18 1930 08/03/18 2000  BP: (!) 157/96 (!) 156/100 (!) 128/114 (!) 153/110  Pulse: 97 92 (!) 54   Resp: (!) 26 (!) 25 (!) 25 (!) 22  Temp:      TempSrc:      SpO2: 95% 96% 96%   Weight:      Height:       Eyes: PERRL, lids and conjunctivae normal ENMT: Mucous membranes are moist. Posterior pharynx clear of any exudate or lesions.  Neck: normal, supple, no masses, no thyromegaly Respiratory: clear to auscultation bilaterally, no wheezing, no crackles. Normal respiratory effort. No accessory muscle use.  Cardiovascular: Irregular rate and rhythm, no murmurs / rubs / gallops.  Bilateral chronic lower extremity swelling with stasis changs/lymphedema  Abdomen: obese, no tenderness, no masses palpated. No hepatosplenomegaly. Bowel sounds positive.  Musculoskeletal: no clubbing / cyanosis. No joint deformity upper and lower extremities. Good ROM, no contractures. Normal muscle tone.  Skin: no rashes, lesions, ulcers. No  induration Neurologic: CN 2-12 grossly intact. Strength 5/5 in all 4.  Psychiatric: Normal judgment and insight. Alert and oriented x 3. Normal mood.   Labs on Admission: I have personally reviewed following labs and imaging studies  CBC: Recent Labs  Lab 08/03/18 1749  WBC 4.5  NEUTROABS 3.1  HGB 12.4*  HCT 39.6  MCV 90.2  PLT 169   Basic Metabolic Panel: Recent Labs  Lab 08/03/18 1749  NA 138  K 3.5  CL 108  CO2 24  GLUCOSE 118*  BUN 14  CREATININE 1.18  CALCIUM 8.5*    Liver Function Tests: Recent Labs  Lab 08/03/18 1749  AST 17  ALT 14  ALKPHOS 64  BILITOT 1.4*  PROT 6.9  ALBUMIN 3.8   Cardiac Enzymes: Recent Labs  Lab 08/03/18 1749  TROPONINI <0.03    Radiological Exams on Admission: Dg Chest Portable 1 View  Result Date: 08/03/2018 CLINICAL DATA:  53 year old male with a history of leg edema shortness of breath EXAM: PORTABLE CHEST 1 VIEW COMPARISON:  07/25/2018, 07/22/2018, 07/21/2018 FINDINGS: Cardiomediastinal silhouette unchanged with cardiomegaly. Fullness in the central vasculature with interlobular septal thickening. No pneumothorax. No large pleural effusion. No displaced fracture. IMPRESSION: Cardiomegaly with evidence of mild pulmonary edema. Atypical infection not excluded. Electronically Signed   By: Gilmer MorJaime  Wagner D.O.   On: 08/03/2018 19:03    EKG: Independently reviewed.  Atrial fibrillation.  Rate 104.  QTc 474.  No change from prior.  Assessment/Plan Active Problems:   SOB (shortness of breath)   Decompensated systolic CHF-SOB, 5 to 7 pound weight gain.  Chest x-ray shows mild pulmonary edema.  Troponin normal at 0.03.  Last echo 03/2018, EF 55%, could not assessed diastolic dysfunction 2/2 fib.  Home medication Bumetanide 3 times daily. IV lasix 80 x 1 given in ED  - IV lasix 60 BID  - Strict input output, daily weights, fluid restrict - BMP a.m. -Continue aspirin, statins -Continue home K supplements  Essential  hypertension Continue carvedilol 37.5 mg p.o. twice daily, and spironolactone  Atrial fibrillation, chronic-on anticoagulation, rate controlled  - continue Xarelto. - Continue carvedilol 37.5 mg p.o. twice daily.  Morbid obesity, OHS Needs significant lifestyle modification - CPAP QHS  Asthma-stable -Continue home bronchodilators, fluticasone inhaler   DVT prophylaxis: Xarelto Code Status: Full Family Communication: None at bedside Disposition Plan: per rounding team Consults called: None Admission status: Inpt, tele   Onnie Boer MD [facility]  08/03/2018, 8:46 PM

## 2018-08-03 NOTE — ED Provider Notes (Signed)
[facility]ENCY [facility]PARTMENT Provider Note   CSN: [id number] Arrival date & time: 08/03/18  1722     History   Chief Complaint Chief Complaint  Patient presents with  . Shortness of Breath    HPI [name] is a 53 y.o. male.  HPI Patient presents with shortness of breath.  Worse for the last few days.  Home health is seen patient and reportedly is up around 7 or 8 pounds.  Patient states he thinks he is doing worse because the pills he is on is of a different name.  Has some dull chest pain.  Increased swelling in his legs.  Multiple visits for CHF. Past Medical History:  Diagnosis Date  . Allergic rhinitis   . Asthma   . Atrial fibrillation (HCC)   . CHF (congestive heart failure) (HCC)   . Essential hypertension   . Gastroesophageal reflux disease   . Heart attack (HCC)   . History of cardiac catheterization    Normal coronaries December 2016  . Noncompliance    Major problem leading to declining health and recurrent hospitalization  . Nonischemic cardiomyopathy (HCC)    LVEF 20-25%  . On home O2    3L N/C  . OSA (obstructive sleep apnea)   . Osteoarthritis   . Peptic ulcer disease     Patient Active Problem List   Diagnosis Date Noted  . Volume overload 07/22/2018  . Acute systolic CHF (congestive heart failure) (HCC) 07/04/2018  . Acute on chronic combined systolic and diastolic heart failure (HCC) 04/01/2018  . Hypomagnesemia 04/01/2018  . Acute on chronic diastolic CHF (congestive heart failure) (HCC) 03/11/2018  . Pressure injury of skin 03/11/2018  . CHF (congestive heart failure) (HCC) 09/08/2017  . Altered mental status 05/11/2017  . Acute exacerbation of CHF (congestive heart failure) (HCC) 04/20/2017  . Acute on chronic systolic (congestive) heart failure (HCC) 04/19/2017  . CHF exacerbation (HCC) 04/11/2017  . Chronic respiratory failure with hypoxia (HCC) 04/11/2017  . Encounter for hospice care discussion   . Palliative care encounter     . Goals of care, counseling/discussion   . DNR (do not resuscitate)   . DNR (do not resuscitate) discussion   . Chronic congestive heart failure (HCC) 02/18/2017  . Acute systolic heart failure (HCC) 01/18/2017  . Pedal edema 12/02/2016  . Acute CHF (congestive heart failure) (HCC) 11/09/2016  . Acute on chronic systolic CHF (congestive heart failure) (HCC) 08/19/2016  . CKD (chronic kidney disease), stage II 05/17/2016  . Coronary arteries, normal Dec 2016 01/23/2016  . Chronic anticoagulation-Xarelto 01/23/2016  . NSVT (nonsustained ventricular tachycardia) (HCC) 01/23/2016  . Elevated troponin 12/21/2015  . Nonischemic cardiomyopathy (HCC)   . Morbid obesity due to excess calories (HCC)   . Noncompliance with diet and medication regimen 12/02/2015  . OSA (obstructive sleep apnea) 09/20/2015  . Pulmonary hypertension (HCC) 09/19/2015  . Normocytic anemia 09/19/2015  . Atrial fibrillation, chronic   . Dyspnea on exertion 05/28/2015  . Acute respiratory failure with hypoxia (HCC) 04/01/2015  . Hypokalemia 04/01/2015  . Obesity hypoventilation syndrome (HCC) 08/08/2014  . GERD (gastroesophageal reflux disease) 08/08/2014  . Edema of right lower extremity 06/21/2014  . Fecal urgency 06/21/2014  . Asthma 02/11/2009  . Hyperlipidemia 07/12/2008  . OBESITY, MORBID 07/12/2008  . Anxiety state 07/12/2008  . DEPRESSION 07/12/2008  . Essential hypertension 07/12/2008  . ALLERGIC RHINITIS 07/12/2008  . Peptic ulcer 07/12/2008  . OSTEOARTHRITIS 07/12/2008    Past Surgical History:  Procedure  Laterality Date  . CARDIAC CATHETERIZATION N/A 06/14/2015   Procedure: Right/Left Heart Cath and Coronary Angiography;  Surgeon: Runell Gess, MD;  Location: [facility] INVASIVE CV LAB;  Service: Cardiovascular;  Laterality: N/A;        Home Medications    Prior to Admission medications   Medication Sig Start Date End Date Taking? Authorizing Provider  albuterol (PROVENTIL) (2.5 MG/3ML)  0.083% nebulizer solution Take 2.5 mg by nebulization every 6 (six) hours as needed for wheezing or shortness of breath.   Yes [provider]  aspirin 81 MG chewable tablet Chew 1 tablet (81 mg total) by mouth daily. 04/13/17  Yes Erick Blinks, MD  atorvastatin (LIPITOR) 40 MG tablet Take 1 tablet (40 mg total) by mouth daily at 6 PM. 06/25/16  Yes Kathlen Mody, MD  beclomethasone (QVAR) 40 MCG/ACT inhaler Inhale 2 puffs into the lungs daily as needed (for shortness of breath).   Yes [provider]  bumetanide (BUMEX) 2 MG tablet Take 2 mg by mouth 3 (three) times daily.   Yes [provider]  carvedilol (COREG) 25 MG tablet Take 1.5 tablets (37.5 mg total) by mouth 2 (two) times daily with a meal. Patient taking differently: Take 37.5 mg by mouth 2 (two) times daily with a meal. Patient reports he is only taking 1 tablet twice daily- 07/01/17 10/07/17  Yes [name], MD  docusate sodium (COLACE) 100 MG capsule Take 1 capsule (100 mg total) by mouth 2 (two) times daily. 06/25/16  Yes Kathlen Mody, MD  ferrous gluconate (FERGON) 324 MG tablet Take 1 tablet (324 mg total) by mouth 2 (two) times daily with a meal. 06/25/16  Yes Kathlen Mody, MD  fluticasone (FLOVENT HFA) 220 MCG/ACT inhaler Inhale 2 puffs into the lungs 2 (two) times daily.   Yes [provider]  OXYGEN Inhale 3.5 L into the lungs daily.   Yes [provider]  pantoprazole (PROTONIX) 40 MG tablet Take 1 tablet (40 mg total) by mouth daily at 6 (six) AM. Patient taking differently: Take 40 mg by mouth every evening.  06/26/16  Yes Kathlen Mody, MD  potassium chloride SA (K-DUR,KLOR-CON) 20 MEQ tablet Take 1 tablet (20 mEq total) by mouth 2 (two) times daily. 06/13/18  Yes Sherryll Burger, Pratik D, DO  rivaroxaban (XARELTO) 20 MG TABS tablet Take 1 tablet (20 mg total) by mouth daily with breakfast. Patient taking differently: Take 20 mg by mouth daily with supper.  06/26/16  Yes Kathlen Mody, MD   spironolactone (ALDACTONE) 100 MG tablet Take 100 mg by mouth daily. For fluid 03/18/17  Yes [provider]  tamsulosin (FLOMAX) 0.4 MG CAPS capsule Take 1 capsule (0.4 mg total) by mouth daily. 06/25/16  Yes Kathlen Mody, MD  indomethacin (INDOCIN) 25 MG capsule Take 1 capsule (25 mg total) by mouth 3 (three) times daily as needed (as needed for gout attack). Take one capsule by mouth three times a day for acute gout. Stop as soon as gout attack is over. Patient not taking: Reported on 08/03/2018 07/25/18   Cleora Fleet, MD  potassium chloride 20 MEQ TBCR Take 20 mEq by mouth 2 (two) times daily. 09/03/15 09/03/15  Ward, Layla Maw, DO    Family History Family History  Problem Relation Age of Onset  . Stroke Father   . Heart attack Father   . Aneurysm Mother        Cerebral aneurysm  . Hypertension Sister   . Colon cancer Neg Hx   .  Inflammatory bowel disease Neg Hx   . Liver disease Neg Hx     Social History Social History   Tobacco Use  . Smoking status: Former Smoker    Packs/day: 0.50    Years: 20.00    Pack years: 10.00    Types: Cigarettes    Start date: 04/26/1988    Last attempt to quit: 06/23/2007    Years since quitting: 11.1  . Smokeless tobacco: Never Used  . Tobacco comment: 1 ppd former smoker  Substance Use Topics  . Alcohol use: No    Alcohol/week: 0.0 standard drinks    Comment: No etoh since 2009  . Drug use: No     Allergies   Banana; Bee venom; Food; Aspirin; Metolazone; Oatmeal; Orange juice [orange oil]; Torsemide; Diltiazem; Hydralazine; and Lipitor [atorvastatin]   Review of Systems Review of Systems  Constitutional: Negative for appetite change.  HENT: Negative for congestion.   Respiratory: Positive for shortness of breath.   Cardiovascular: Positive for chest pain and leg swelling.  Gastrointestinal: Negative for abdominal pain.  Genitourinary: Negative for flank pain.  Musculoskeletal: Negative for back pain.  Skin: Negative  for rash.  Neurological: Positive for weakness.     Physical Exam Updated Vital Signs BP (!) 157/96   Pulse 97   Temp 98.2 F (36.8 C) (Oral)   Resp (!) 26   Ht 6' (1.829 m)   Wt (!) 170 kg   SpO2 95%   BMI 50.83 kg/m   Physical Exam Vitals signs and nursing note reviewed.  Constitutional:      Appearance: He is well-developed.  HENT:     Head:     Comments: Patient is wearing sunglasses Neck:     Musculoskeletal: Neck supple.  Cardiovascular:     Comments: Irregular rhythm. Pulmonary:     Comments: Rales bilateral bases. Chest:     Chest wall: No tenderness.  Abdominal:     Tenderness: There is no abdominal tenderness.  Musculoskeletal:     Right lower leg: Edema present.     Left lower leg: Edema present.  Skin:    Capillary Refill: Capillary refill takes less than 2 seconds.  Neurological:     General: No focal deficit present.     Comments: Somewhat slow to answer.      ED Treatments / Results  Labs (all labs ordered are listed, but only abnormal results are displayed) Labs Reviewed  COMPREHENSIVE METABOLIC PANEL - Abnormal; Notable for the following components:      Result Value   Glucose, Bld 118 (*)    Calcium 8.5 (*)    Total Bilirubin 1.4 (*)    All other components within normal limits  BRAIN NATRIURETIC PEPTIDE - Abnormal; Notable for the following components:   B Natriuretic Peptide 346.0 (*)    All other components within normal limits  CBC WITH DIFFERENTIAL/PLATELET - Abnormal; Notable for the following components:   Hemoglobin 12.4 (*)    All other components within normal limits  TROPONIN I    EKG EKG Interpretation  Date/Time:  Wednesday August 03 2018 17:34:26 EST Ventricular Rate:  104 PR Interval:    QRS Duration: 97 QT Interval:  360 QTC Calculation: 474 R Axis:   68 Text Interpretation:  Atrial fibrillation Borderline repolarization abnormality Confirmed by Benjiman Core [id number]) on 08/03/2018 5:49:24  PM   Radiology Dg Chest Portable 1 View  Result Date: 08/03/2018 CLINICAL DATA:  53 year old male with a history of leg edema shortness of  breath EXAM: PORTABLE CHEST 1 VIEW COMPARISON:  07/25/2018, 07/22/2018, 07/21/2018 FINDINGS: Cardiomediastinal silhouette unchanged with cardiomegaly. Fullness in the central vasculature with interlobular septal thickening. No pneumothorax. No large pleural effusion. No displaced fracture. IMPRESSION: Cardiomegaly with evidence of mild pulmonary edema. Atypical infection not excluded. Electronically Signed   By: Gilmer Mor D.O.   On: 08/03/2018 19:03    Procedures Procedures (including critical care time)  Medications Ordered in ED Medications - No data to display   Initial Impression / Assessment and Plan / ED Course  I have reviewed the triage vital signs and the nursing notes.  Pertinent labs & imaging results that were available during my care of the patient were reviewed by me and considered in my medical decision making (see chart for details).     Patient with acute on chronic CHF.  Weight up at home.  States more short of breath.  EKG shows atrial fibrillation.  BNP elevated above baseline with x-ray shows vascular congestion/mild CHF.  Will discuss with hospitalist  Final Clinical Impressions(s) / ED Diagnoses   Final diagnoses:  Acute on chronic congestive heart failure, unspecified heart failure type [facility])    ED Discharge Orders    None       Benjiman Core, MD 08/03/18 1933

## 2018-08-04 ENCOUNTER — Encounter (HOSPITAL_COMMUNITY): Payer: Self-pay

## 2018-08-04 DIAGNOSIS — R0602 Shortness of breath: Secondary | ICD-10-CM

## 2018-08-04 LAB — BASIC METABOLIC PANEL
Anion gap: 9 (ref 5–15)
BUN: 11 mg/dL (ref 6–20)
CHLORIDE: 105 mmol/L (ref 98–111)
CO2: 27 mmol/L (ref 22–32)
Calcium: 8.5 mg/dL — ABNORMAL LOW (ref 8.9–10.3)
Creatinine, Ser: 1.12 mg/dL (ref 0.61–1.24)
GFR calc Af Amer: >60 mL/min (ref >60)
GFR calc non Af Amer: >60 mL/min (ref >60)
Glucose, Bld: 95 mg/dL (ref 70–99)
POTASSIUM: 3.5 mmol/L (ref 3.5–5.1)
SODIUM: 141 mmol/L (ref 135–145)

## 2018-08-04 LAB — MAGNESIUM: Magnesium: 1.9 mg/dL (ref 1.7–2.4)

## 2018-08-04 MED ORDER — ALBUTEROL SULFATE (2.5 MG/3ML) 0.083% IN NEBU: 2.5000 mg | INHALATION_SOLUTION | Freq: Four times a day (QID) | RESPIRATORY_TRACT | Status: DC | PRN

## 2018-08-04 MED ORDER — FUROSEMIDE 10 MG/ML IJ SOLN
80.0000 mg | Freq: Three times a day (TID) | INTRAMUSCULAR | Status: DC
  Administered 2018-08-04 – 2018-08-05 (×3): 80 mg via INTRAVENOUS
  Filled 2018-08-04 (×3): qty 8

## 2018-08-04 MED ORDER — DOCUSATE SODIUM 100 MG PO CAPS
100.0000 mg | ORAL_CAPSULE | Freq: Two times a day (BID) | ORAL | Status: DC
  Administered 2018-08-04 – 2018-08-05 (×3): 100 mg via ORAL
  Filled 2018-08-04 (×3): qty 1

## 2018-08-04 MED ORDER — ONDANSETRON HCL 4 MG/2ML IJ SOLN: 4.0000 mg | Freq: Four times a day (QID) | INTRAMUSCULAR | Status: DC | PRN

## 2018-08-04 MED ORDER — POLYETHYLENE GLYCOL 3350 17 G PO PACK: 17.0000 g | PACK | Freq: Every day | ORAL | Status: DC | PRN

## 2018-08-04 MED ORDER — TAMSULOSIN HCL 0.4 MG PO CAPS
0.4000 mg | ORAL_CAPSULE | Freq: Every day | ORAL | Status: DC
  Administered 2018-08-04 – 2018-08-05 (×2): 0.4 mg via ORAL
  Filled 2018-08-04 (×2): qty 1

## 2018-08-04 MED ORDER — PANTOPRAZOLE SODIUM 40 MG PO TBEC
40.0000 mg | DELAYED_RELEASE_TABLET | Freq: Every evening | ORAL | Status: DC
  Administered 2018-08-04: 40 mg via ORAL
  Filled 2018-08-04: qty 1

## 2018-08-04 MED ORDER — ASPIRIN 81 MG PO CHEW
81.0000 mg | CHEWABLE_TABLET | Freq: Every day | ORAL | Status: DC
  Administered 2018-08-04 – 2018-08-05 (×2): 81 mg via ORAL
  Filled 2018-08-04 (×2): qty 1

## 2018-08-04 MED ORDER — CARVEDILOL 12.5 MG PO TABS
37.5000 mg | ORAL_TABLET | Freq: Two times a day (BID) | ORAL | Status: DC
  Administered 2018-08-04 – 2018-08-05 (×3): 37.5 mg via ORAL
  Filled 2018-08-04 (×3): qty 3

## 2018-08-04 MED ORDER — BUDESONIDE 0.5 MG/2ML IN SUSP
1.0000 mg | Freq: Two times a day (BID) | RESPIRATORY_TRACT | Status: DC
  Administered 2018-08-04 – 2018-08-05 (×3): 1 mg via RESPIRATORY_TRACT
  Filled 2018-08-04 (×6): qty 4

## 2018-08-04 MED ORDER — ACETAMINOPHEN 650 MG RE SUPP: 650.0000 mg | Freq: Four times a day (QID) | RECTAL | Status: DC | PRN

## 2018-08-04 MED ORDER — ATORVASTATIN CALCIUM 40 MG PO TABS
40.0000 mg | ORAL_TABLET | Freq: Every day | ORAL | Status: DC
  Administered 2018-08-04: 40 mg via ORAL
  Filled 2018-08-04: qty 1

## 2018-08-04 MED ORDER — RIVAROXABAN 20 MG PO TABS
20.0000 mg | ORAL_TABLET | Freq: Every day | ORAL | Status: DC
  Administered 2018-08-04: 20 mg via ORAL
  Filled 2018-08-04: qty 1

## 2018-08-04 MED ORDER — POTASSIUM CHLORIDE CRYS ER 20 MEQ PO TBCR
20.0000 meq | EXTENDED_RELEASE_TABLET | Freq: Two times a day (BID) | ORAL | Status: DC
  Administered 2018-08-04 – 2018-08-05 (×3): 20 meq via ORAL
  Filled 2018-08-04 (×3): qty 1

## 2018-08-04 MED ORDER — ACETAMINOPHEN 325 MG PO TABS: 650.0000 mg | ORAL_TABLET | Freq: Four times a day (QID) | ORAL | Status: DC | PRN

## 2018-08-04 MED ORDER — SPIRONOLACTONE 25 MG PO TABS
100.0000 mg | ORAL_TABLET | Freq: Every day | ORAL | Status: DC
  Administered 2018-08-04 – 2018-08-05 (×2): 100 mg via ORAL
  Filled 2018-08-04 (×2): qty 4

## 2018-08-04 MED ORDER — ONDANSETRON HCL 4 MG PO TABS: 4.0000 mg | ORAL_TABLET | Freq: Four times a day (QID) | ORAL | Status: DC | PRN

## 2018-08-04 NOTE — Progress Notes (Signed)
PROGRESS NOTE    [name]  FSF:[id number] DOB: 1966-04-28 DOA: 08/03/2018 PCP: Pearson Grippe, MD   Brief Narrative:  53 year old with history of systolic CHF, nonischemic cardiomyopathy, atrial fibrillation on anticoagulation, obesity hypoventilation syndrome, essential hypertension, depression came to the ER with complaints of difficulty breathing and recent weight gain of about 7 pounds.  He was found to be in CHF exacerbation admitted for diuresis.   Assessment & Plan:   Principal Problem:   SOB (shortness of breath) Active Problems:   Essential hypertension   Atrial fibrillation, chronic   Morbid obesity due to excess calories (HCC)   CKD (chronic kidney disease), stage II   Acute CHF (congestive heart failure) (HCC)   Acute on chronic combined systolic and diastolic heart failure (HCC)  Acute congestive heart failure with preserved ejection fraction, 55%, class III - Continue aggressive IV diuresis.  Increase Lasix to 80 mg every 8 hours, monitor input and output, daily weight.  Closely monitor electrolytes. -Fluid restriction 1800 cc added.  Continue Coreg 37.5 mg, Aldactone 100 mg daily  Essential hypertension -On Aldactone and Coreg.  Also getting aggressive diuresis.  Chronic atrial fibrillation -On Xarelto.  Morbid obesity with BMI greater than 50 Obesity hypoventilation syndrome -CPAP at bedtime  Chronic asthma -Appears to be stable.  Continue home bronchodilators.  GERD -PPI  DVT prophylaxis: Xarelto Code Status: Full code Family Communication: None at bedside Disposition Plan: Maintain in hospital stay for at least 24 hours of IV diuresis.  Consultants:   None  Procedures:   None  Antimicrobials:   None   Subjective: Patient still has quite a bit of exertional shortness of breath.  Unable to walk from bed to the bathroom without feeling short of breath.  At rest he feels better for now.  Review of Systems Otherwise negative except as per  HPI, including: General: Denies fever, chills, night sweats or unintended weight loss. Resp: Denies cough, wheezing, shortness of breath. Cardiac: Denies chest pain, palpitations, orthopnea, paroxysmal nocturnal dyspnea. GI: Denies abdominal pain, nausea, vomiting, diarrhea or constipation GU: Denies dysuria, frequency, hesitancy or incontinence MS: Denies muscle aches, joint pain or swelling Neuro: Denies headache, neurologic deficits (focal weakness, numbness, tingling), abnormal gait Psych: Denies anxiety, depression, SI/HI/AVH Skin: Denies new rashes or lesions ID: Denies sick contacts, exotic exposures, travel  Objective: Vitals:   08/04/18 0330 08/04/18 0430 08/04/18 0525 08/04/18 0847  BP: (!) 159/97 (!) 163/110 (!) 148/104   Pulse: 61 86 (!) 105   Resp: (!) 21 20 (!) 24   Temp:   98.4 F (36.9 C)   TempSrc:   Oral   SpO2: 94% 96% 96% 97%  Weight:   (!) 174.5 kg   Height:   6' (1.829 m)     Intake/Output Summary (Last 24 hours) at 08/04/2018 1232 Last data filed at 08/04/2018 1139 Gross per 24 hour  Intake -  Output 4775 ml  Net -4775 ml   Filed Weights   08/03/18 1725 08/04/18 0525  Weight: (!) 170 kg (!) 174.5 kg    Examination:  General exam: Appears calm and comfortable  Respiratory system: Diffuse diminished breath sounds Cardiovascular system: S1 & S2 heard, RRR. No JVD, murmurs, rubs, gallops or clicks.  3+ bilateral lower extremity pitting edema Gastrointestinal system: Abdomen is nondistended, soft and nontender. No organomegaly or masses felt. Normal bowel sounds heard. Central nervous system: Alert and oriented. No focal neurological deficits. Extremities: Symmetric 4 x 5 power. Skin: Chronic bilateral lower extremity skin  changes Psychiatry: Judgement and insight appear normal. Mood & affect appropriate.     Data Reviewed:   CBC: Recent Labs  Lab 08/03/18 1749  WBC 4.5  NEUTROABS 3.1  HGB 12.4*  HCT 39.6  MCV 90.2  PLT 169   Basic  Metabolic Panel: Recent Labs  Lab 08/03/18 1749 08/04/18 0710  NA 138 141  K 3.5 3.5  CL 108 105  CO2 24 27  GLUCOSE 118* 95  BUN 14 11  CREATININE 1.18 1.12  CALCIUM 8.5* 8.5*  MG  --  1.9   GFR: Estimated Creatinine Clearance: 129.9 mL/min (by C-G formula based on SCr of 1.12 mg/dL). Liver Function Tests: Recent Labs  Lab 08/03/18 1749  AST 17  ALT 14  ALKPHOS 64  BILITOT 1.4*  PROT 6.9  ALBUMIN 3.8   No results for input(s): LIPASE, AMYLASE in the last 168 hours. No results for input(s): AMMONIA in the last 168 hours. Coagulation Profile: No results for input(s): INR, PROTIME in the last 168 hours. Cardiac Enzymes: Recent Labs  Lab 08/03/18 1749  TROPONINI <0.03   BNP (last 3 results) No results for input(s): PROBNP in the last 8760 hours. HbA1C: No results for input(s): HGBA1C in the last 72 hours. CBG: No results for input(s): GLUCAP in the last 168 hours. Lipid Profile: No results for input(s): CHOL, HDL, LDLCALC, TRIG, CHOLHDL, LDLDIRECT in the last 72 hours. Thyroid Function Tests: No results for input(s): TSH, T4TOTAL, FREET4, T3FREE, THYROIDAB in the last 72 hours. Anemia Panel: No results for input(s): VITAMINB12, FOLATE, FERRITIN, TIBC, IRON, RETICCTPCT in the last 72 hours. Sepsis Labs: No results for input(s): PROCALCITON, LATICACIDVEN in the last 168 hours.  No results found for this or any previous visit (from the past 240 hour(s)).       Radiology Studies: Dg Chest Portable 1 View  Result Date: 08/03/2018 CLINICAL DATA:  53 year old male with a history of leg edema shortness of breath EXAM: PORTABLE CHEST 1 VIEW COMPARISON:  07/25/2018, 07/22/2018, 07/21/2018 FINDINGS: Cardiomediastinal silhouette unchanged with cardiomegaly. Fullness in the central vasculature with interlobular septal thickening. No pneumothorax. No large pleural effusion. No displaced fracture. IMPRESSION: Cardiomegaly with evidence of mild pulmonary edema. Atypical  infection not excluded. Electronically Signed   By: Gilmer Mor D.O.   On: 08/03/2018 19:03        Scheduled Meds: . aspirin  81 mg Oral Daily  . atorvastatin  40 mg Oral q1800  . budesonide  1 mg Inhalation BID  . carvedilol  37.5 mg Oral BID WC  . docusate sodium  100 mg Oral BID  . furosemide  60 mg Intravenous Q12H  . pantoprazole  40 mg Oral QPM  . potassium chloride SA  20 mEq Oral BID  . rivaroxaban  20 mg Oral Q supper  . spironolactone  100 mg Oral Daily  . tamsulosin  0.4 mg Oral Daily   Continuous Infusions:   LOS: 1 day   Time spent= 35 mins    Jillane Po Joline Maxcy, MD [facility]  If 7PM-7AM, please contact night-coverage [email or web address] 08/04/2018, 12:32 PM

## 2018-08-04 NOTE — ED Notes (Signed)
ED TO INPATIENT HANDOFF REPORT  Name/Age/Gender Samuel KlippelMark T Fitzpatrick 53 y.o. male  Code Status Code Status History    Date Active Date Inactive Code Status Order ID Comments User Context   07/22/2018 2129 07/25/2018 1435 Full Code [id number]  Bobette Mortiz, David Manuel, MD ED   07/03/2018 0125 07/06/2018 1954 DNR [id number]  Meredeth IdeLama, Gagan S, MD Inpatient   06/11/2018 1046 06/13/2018 1610 Full Code [id number]  Maurilio LovelyShah, Pratik D, DO ED   04/01/2018 0538 04/03/2018 1426 Full Code [id number]  Bobette Mortiz, David Manuel, MD ED   03/11/2018 1154 03/14/2018 1638 Full Code [id number]  Henderson CloudHernandez Acosta, Estela Y, MD Inpatient   09/08/2017 1613 09/11/2017 1911 DNR [id number]  Maurilio LovelyShah, Pratik D, DO ED   09/08/2017 1552 09/08/2017 1613 Full Code [id number]  Maurilio LovelyShah, Pratik D, DO ED   05/11/2017 0744 05/13/2017 1619 Full Code [id number]  Bobette Mortiz, David Manuel, MD Inpatient   05/09/2017 1728 05/10/2017 0341 DNR [id number]  Erick BlinksMemon, Jehanzeb, MD Inpatient   05/09/2017 0520 05/09/2017 1728 Full Code [id number]  Clydie BraunSmith, Rondell A, MD ED   04/19/2017 2341 04/23/2017 1625 Full Code [id number]  Bobette Mortiz, David Manuel, MD Inpatient   04/11/2017 1710 04/13/2017 1541 DNR [id number]  Erick BlinksMemon, Jehanzeb, MD Inpatient   03/01/2017 1134 03/01/2017 1526 DNR [id number]  Katheran Aweove, Tasha A, NP ED   02/18/2017 1712 02/19/2017 1444 Full Code [id number]  Houston SirenLe, Peter, MD Inpatient   02/05/2017 1947 02/08/2017 1752 Full Code [id number]  Catarina Hartshornat, David, MD Inpatient   01/18/2017 1159 01/19/2017 1906 Full Code [id number]  Marinda ElkFeliz Ortiz, Abraham, MD Inpatient   12/03/2016 0059 12/06/2016 1628 Full Code [id number]  Houston SirenLe, Peter, MD Inpatient   11/09/2016 1512 11/13/2016 1650 Full Code [id number]  Filbert SchilderKadolph, Alexandria U, MD Inpatient   10/24/2016 1733 10/27/2016 1853 Full Code [id number]  Briscoe Deutscherpyd, Timothy S, MD ED   10/01/2016 0410 10/01/2016 1612 Full Code [id number]  Pearson GrippeKim, [name], MD Inpatient   08/19/2016 0321 08/21/2016 1720 Full Code [id number]  Haydee Monicaavid, Rachal A, MD Inpatient   07/19/2016 1617 07/25/2016 1503 Full Code [id number]  Henderson CloudHernandez  Acosta, Estela Y, MD Inpatient   06/22/2016 0620 06/25/2016 1738 Full Code [id number]  Pearson GrippeKim, [name], MD Inpatient   05/17/2016 2035 05/22/2016 1754 Full Code [id number]  Briscoe Deutscherpyd, Timothy S, MD ED   04/04/2016 1547 04/07/2016 1426 Full Code [id number]  Clydia LlanoElmahi, Mutaz, MD Inpatient   01/21/2016 0217 01/25/2016 2217 Full Code [id number]  Meredeth IdeLama, Gagan S, MD Inpatient   01/05/2016 0744 01/09/2016 1658 Full Code [id number]  Haydee Monicaavid, Rachal A, MD Inpatient   12/21/2015 1647 12/25/2015 1829 Full Code [id number]  Briscoe Deutscherpyd, Timothy S, MD ED   12/03/2015 0351 12/04/2015 1845 Full Code [id number]  Pearson GrippeKim, [name], MD Inpatient   11/25/2015 1916 11/27/2015 1702 Full Code [id number]  Pearson GrippeKim, [name], MD Inpatient   11/18/2015 0110 11/21/2015 1844 Full Code [id number]  Houston SirenLe, Peter, MD Inpatient   11/05/2015 1611 11/09/2015 2006 Full Code [id number]  Standley BrookingGoodrich, Daniel P, MD Inpatient   10/26/2015 1650 10/31/2015 1648 Full Code [id number]  Marcos EkeWertman, Sara E, PA-C ED   10/26/2015 1554 10/26/2015 1650 Full Code [id number]  Marcos EkeWertman, Sara E, PA-C ED   10/07/2015 2037 10/15/2015 1700 Full Code [id number]  Meredeth IdeLama, Gagan S, MD Inpatient   09/28/2015 1646 09/30/2015 1635 Full Code [id number]  John Giovanniathore, Vasundhra, MD Inpatient   09/18/2015 2331 09/22/2015 1449 Full Code [id number]  Houston SirenLe, Peter, MD ED   09/10/2015 1950 09/15/2015 1806 Full Code [id number]  Standley BrookingGoodrich, Daniel P, MD Inpatient   06/14/2015 1731 06/17/2015 1634 Full Code [id number]  Jetty DuhamelMcClung, Jeffrey  T, MD Inpatient   06/14/2015 1104 06/14/2015 1731 Full Code [id number]  Runell Gess, MD Inpatient   06/13/2015 1650 06/14/2015 1104 Full Code [id number]  Houston Siren, MD Inpatient   05/28/2015 [phone] 05/29/2015 1718 Full Code [id number]  Haydee Monica, MD Inpatient   04/01/2015 1241 04/02/2015 1609 Full Code [id number]  Erick Blinks, MD Inpatient   08/08/2014 1948 08/10/2014 1628 Full Code [id number]  Ozella Rocks, MD Inpatient   06/28/2014 0525 06/30/2014 1930 Full Code [id number]  Haydee Monica, MD Inpatient   04/02/2014 1436 04/04/2014 1933 Full Code  [id number]  Gwenyth Bender, NP Inpatient      Home/SNF/Other Home  Chief Complaint Shortness of Breath   Level of Care/Admitting Diagnosis ED Disposition    ED Disposition Condition Comment   Admit  Hospital Area: [facility] [[id number]]  Level of Care: Telemetry [5]  Diagnosis: SOB (shortness of breath) [193790]  Admitting Physician: Onnie Boer [[id number]]  Attending Physician: Onnie Boer [phone]  Estimated length of stay: past midnight tomorrow  Certification:: I certify this patient will need inpatient services for at least 2 midnights  PT Class (Do Not Modify): Inpatient [101]  PT Acc Code (Do Not Modify): Private [1]       Medical History Past Medical History:  Diagnosis Date  . Allergic rhinitis   . Asthma   . Atrial fibrillation (HCC)   . CHF (congestive heart failure) (HCC)   . Essential hypertension   . Gastroesophageal reflux disease   . Heart attack (HCC)   . History of cardiac catheterization    Normal coronaries December 2016  . Noncompliance    Major problem leading to declining health and recurrent hospitalization  . Nonischemic cardiomyopathy (HCC)    LVEF 20-25%  . On home O2    3L N/C  . OSA (obstructive sleep apnea)   . Osteoarthritis   . Peptic ulcer disease     Allergies Allergies  Allergen Reactions  . Banana Shortness Of Breath  . Bee Venom Shortness Of Breath, Swelling and Other (See Comments)    Reaction:  Facial swelling  . Food Shortness Of Breath, Rash and Other (See Comments)    Pt states that he is allergic to strawberries.    . Aspirin Other (See Comments)    Reaction:  GI upset   . Metolazone Other (See Comments)    Pt states that he stopped taking this med due to heart attack like symptoms.   . Oatmeal Nausea And Vomiting  . Orange Juice [Orange Oil] Nausea And Vomiting    All acidic products make him nauseous and upset stomach  . Torsemide Swelling and Other (See Comments)    Reaction:  Swelling of  feet/legs   . Diltiazem Palpitations  . Hydralazine Palpitations  . Lipitor [Atorvastatin] Other (See Comments)    Reaction:  Nose bleeds     IV Location/Drains/Wounds Patient Lines/Drains/Airways Status   Active Line/Drains/Airways    Name:   Placement date:   Placement time:   Site:   Days:   Peripheral IV 08/03/18 Left Antecubital   08/03/18    1749    Antecubital   1   Wound / Incision (Open or Dehisced) 07/03/18 Venous stasis ulcer Leg Right   07/03/18    0150    Leg   32          Labs/Imaging Results for orders placed or performed during the hospital encounter of 08/03/18 (from the past 48 hour(s))  Comprehensive metabolic panel     Status: Abnormal   Collection Time: 08/03/18  5:49 PM  Result Value Ref Range   Sodium 138 135 - 145 mmol/L   Potassium 3.5 3.5 - 5.1 mmol/L   Chloride 108 98 - 111 mmol/L   CO2 24 22 - 32 mmol/L   Glucose, Bld 118 (H) 70 - 99 mg/dL   BUN 14 6 - 20 mg/dL   Creatinine, Ser 9.38 0.61 - 1.24 mg/dL   Calcium 8.5 (L) 8.9 - 10.3 mg/dL   Total Protein 6.9 6.5 - 8.1 g/dL   Albumin 3.8 3.5 - 5.0 g/dL   AST 17 15 - 41 U/L   ALT 14 0 - 44 U/L   Alkaline Phosphatase 64 38 - 126 U/L   Total Bilirubin 1.4 (H) 0.3 - 1.2 mg/dL   GFR calc non Af Amer >60 >60 mL/min   GFR calc Af Amer >60 >60 mL/min   Anion gap 6 5 - 15    Comment: Performed at [facility], [address] [place], [place] [address]  Brain natriuretic peptide     Status: Abnormal   Collection Time: 08/03/18  5:49 PM  Result Value Ref Range   B Natriuretic Peptide 346.0 (H) 0.0 - 100.0 pg/mL    Comment: Performed at [facility], [address], [place], [place] [address]  Troponin I - Once     Status: None   Collection Time: 08/03/18  5:49 PM  Result Value Ref Range   Troponin I <0.03 <0.03 ng/mL    Comment: Performed at [facility], [address], [place], [place] [address]  CBC with Differential     Status: Abnormal   Collection Time: 08/03/18  5:49 PM  Result Value Ref Range    WBC 4.5 4.0 - 10.5 K/uL   RBC 4.39 4.22 - 5.81 MIL/uL   Hemoglobin 12.4 (L) 13.0 - 17.0 g/dL   HCT 24.2 35.3 - 61.4 %   MCV 90.2 80.0 - 100.0 fL   MCH 28.2 26.0 - 34.0 pg   MCHC 31.3 30.0 - 36.0 g/dL   RDW 43.1 54.0 - 08.6 %   Platelets 169 150 - 400 K/uL   nRBC 0.0 0.0 - 0.2 %   Neutrophils Relative % 70 %   Neutro Abs 3.1 1.7 - 7.7 K/uL   Lymphocytes Relative 19 %   Lymphs Abs 0.9 0.7 - 4.0 K/uL   Monocytes Relative 8 %   Monocytes Absolute 0.4 0.1 - 1.0 K/uL   Eosinophils Relative 3 %   Eosinophils Absolute 0.1 0.0 - 0.5 K/uL   Basophils Relative 0 %   Basophils Absolute 0.0 0.0 - 0.1 K/uL   Immature Granulocytes 0 %   Abs Immature Granulocytes 0.02 0.00 - 0.07 K/uL    Comment: Performed at [facility], [address] [place], [place] [address]   Dg Chest Portable 1 View  Result Date: 08/03/2018 CLINICAL DATA:  53 year old male with a history of leg edema shortness of breath EXAM: PORTABLE CHEST 1 VIEW COMPARISON:  07/25/2018, 07/22/2018, 07/21/2018 FINDINGS: Cardiomediastinal silhouette unchanged with cardiomegaly. Fullness in the central vasculature with interlobular septal thickening. No pneumothorax. No large pleural effusion. No displaced fracture. IMPRESSION: Cardiomegaly with evidence of mild pulmonary edema. Atypical infection not excluded. Electronically Signed   By: Gilmer Mor D.O.   On: 08/03/2018 19:03    Pending Labs Unresulted Labs (From admission, onward)    Start     Ordered   08/04/18 0500  Magnesium  Tomorrow morning,   R     08/03/18 2210   Signed and Held  Basic metabolic panel  Tomorrow morning,   R     Signed and Held          Vitals/Pain Today's Vitals   08/03/18 2313 08/04/18 0200 08/04/18 0330 08/04/18 0430  BP:  (!) 159/108 (!) 159/97 (!) 163/110  Pulse:  91 61 86  Resp:  20 (!) 21 20  Temp:      TempSrc:      SpO2:  92% 94% 96%  Weight:      Height:      PainSc: Asleep       Isolation Precautions No active  isolations  Medications Medications  furosemide (LASIX) injection 60 mg (has no administration in time range)  furosemide (LASIX) injection 80 mg (80 mg Intravenous Given 08/03/18 2037)  potassium chloride SA (K-DUR,KLOR-CON) CR tablet 40 mEq (40 mEq Oral Given 08/03/18 2229)    Mobility walks

## 2018-08-04 NOTE — Telephone Encounter (Signed)
He could take an extra 2 mg of Bumex today and tomorrow.

## 2018-08-04 NOTE — Telephone Encounter (Signed)
According to record, pt is admitted to [facility]

## 2018-08-04 NOTE — Telephone Encounter (Signed)
[name] went to ED last night, was admitted to [facility]

## 2018-08-04 NOTE — Care Management Note (Signed)
Case Management Note  Patient Details  Name: CAZ WESTERMEYER MRN: [id number] Date of Birth: January 24, 1966  Subjective/Objective:    Admitted with CHF. Previously referred to [facility] for [name]. Per [facility] rep pt very non-compliance stating his lasix makes him retain water, his BMI disqualifies him for the [employer]. He is rude to nursing staff.                 Action/Plan: DC home, unsure if [facility] will accept pt back onto services. Pt active with [facility].   Expected Discharge Date:     08/06/2018             Expected Discharge Plan:  Home/Self Care  In-House Referral:  NA  Discharge planning Services  CM Consult  Post Acute Care Choice:    Choice offered to:     DME Arranged:    DME Agency:     HH Arranged:    HH Agency:     Status of Service:  In process, will continue to follow  If discussed at Long Length of Stay Meetings, dates discussed:    Additional Comments:  [name], RN 08/04/2018, 11:27 AM

## 2018-08-05 LAB — BASIC METABOLIC PANEL
Anion gap: 9 (ref 5–15)
BUN: 15 mg/dL (ref 6–20)
CO2: 28 mmol/L (ref 22–32)
Calcium: 8.5 mg/dL — ABNORMAL LOW (ref 8.9–10.3)
Chloride: 102 mmol/L (ref 98–111)
Creatinine, Ser: 1.23 mg/dL (ref 0.61–1.24)
GFR calc Af Amer: >60 mL/min (ref >60)
GFR calc non Af Amer: >60 mL/min (ref >60)
Glucose, Bld: 92 mg/dL (ref 70–99)
POTASSIUM: 3.5 mmol/L (ref 3.5–5.1)
Sodium: 139 mmol/L (ref 135–145)

## 2018-08-05 LAB — MAGNESIUM: Magnesium: 2.1 mg/dL (ref 1.7–2.4)

## 2018-08-05 MED ORDER — POTASSIUM CHLORIDE 10 MEQ/100ML IV SOLN
10.0000 meq | INTRAVENOUS | Status: AC
  Administered 2018-08-05 (×2): 10 meq via INTRAVENOUS
  Filled 2018-08-05 (×2): qty 100

## 2018-08-05 MED ORDER — POTASSIUM CHLORIDE CRYS ER 20 MEQ PO TBCR
40.0000 meq | EXTENDED_RELEASE_TABLET | Freq: Once | ORAL | Status: AC
  Administered 2018-08-05: 40 meq via ORAL
  Filled 2018-08-05: qty 2

## 2018-08-05 MED ORDER — MAGNESIUM SULFATE IN D5W 1-5 GM/100ML-% IV SOLN
1.0000 g | Freq: Once | INTRAVENOUS | Status: AC
  Administered 2018-08-05: 1 g via INTRAVENOUS
  Filled 2018-08-05 (×2): qty 100

## 2018-08-05 NOTE — Discharge Summary (Signed)
Physician Discharge Summary  Samuel KlippelMark T Fitzpatrick ZOX:[id number] DOB: 04/27/1966 DOA: 08/03/2018  PCP: [name], [name], MD  Admit date: 08/03/2018 Discharge date: 08/05/2018  Admitted From: Home Disposition: Home  Recommendations for Outpatient Follow-up:  1. Follow up with PCP in 1-2 weeks 2. Please obtain BMP/CBC in one week your next doctors visit.  3. Advised to follow 2 L daily fluid restriction, heart healthy diet.  Advised him to weigh himself daily and if he notices more than 3-5 pound weight gain then he needs to take his extra dose of Bumex.  You will also need to notify his cardiologist. 4. Needs to follow-up outpatient cardiology in about 2 weeks. 5. Benefit from outpatient sleep study.  Referral can be provided by his primary care provider.  Home Health: None Equipment/Devices: None Discharge Condition: Stable CODE STATUS: Full code Diet recommendation: Heart healthy diet with 2 L fluid restriction  Brief/Interim Summary: 53 year old with history of systolic CHF, nonischemic cardiomyopathy, atrial fibrillation on anticoagulation, obesity hypoventilation syndrome, essential hypertension, depression came to the ER with complaints of difficulty breathing and recent weight gain of about 7 pounds.  He was found to be in CHF exacerbation admitted for diuresis.  During the hospitalization patient was aggressively diuresed with IV Lasix and oral Aldactone.  He was about 5 L negative over the course of 36 hours.  He was ambulating in the hallway the following day without any issues saturating greater than 90% on room air.  His lower extremity swelling had gone down as well. At this point I extensively educated patient on remaining compliant with his medication, diet along with fluid intake.  I have also expressed to him it is extremely important to follow-up with his outpatient cardiology and follow-up instructions.  Given his body habitus, he would highly benefit from outpatient sleep study as  well.  Unfortunately patient remains high risk for readmission.   Discharge Diagnoses:  Principal Problem:   SOB (shortness of breath) Active Problems:   Essential hypertension   Atrial fibrillation, chronic   Morbid obesity due to excess calories (HCC)   CKD (chronic kidney disease), stage II   Acute CHF (congestive heart failure) (HCC)   Acute on chronic combined systolic and diastolic heart failure (HCC)  Acute congestive heart failure with preserved ejection fraction, 55%, class II - Patient was aggressively diuresed with Lasix.  Currently appears to be euvolemic.  We will transition him back to his home oral Bumex to be taken as prescribed. -Need to follow 2 L fluid restriction and 2 g salt restriction diet.  Continue Coreg 37.5 mg, Aldactone 100 mg daily -Needs to follow outpatient cardiology very closely -Need to check his weight daily at home as well Patient was able to ambulate in the hallway without any issues saturating greater than 92% on room air.  Essential hypertension -On Aldactone and Coreg.    Will be resuming his home Bumex and Aldactone  Chronic atrial fibrillation -On Xarelto.  Morbid obesity with BMI greater than 50 Obesity hypoventilation syndrome -CPAP at bedtime.  Needs outpatient sleep study if he has not had one.  Chronic asthma -Appears to be stable.  Continue home bronchodilators.  GERD -PPI  Xarelto for DVT prophylaxis during hospitalization Full code Discharge today  Discharge Instructions   Allergies as of 08/05/2018      Reactions   Banana Shortness Of Breath   Bee Venom Shortness Of Breath, Swelling, Other (See Comments)   Reaction:  Facial swelling   Food Shortness Of Breath, Rash,  Other (See Comments)   Pt states that he is allergic to strawberries.     Aspirin Other (See Comments)   Reaction:  GI upset    Metolazone Other (See Comments)   Pt states that he stopped taking this med due to heart attack like symptoms.     Oatmeal Nausea And Vomiting   Orange Juice [orange Oil] Nausea And Vomiting   All acidic products make him nauseous and upset stomach   Torsemide Swelling, Other (See Comments)   Reaction:  Swelling of feet/legs    Diltiazem Palpitations   Hydralazine Palpitations   Lipitor [atorvastatin] Other (See Comments)   Reaction:  Nose bleeds       Medication List    STOP taking these medications   indomethacin 25 MG capsule Commonly known as:  INDOCIN     TAKE these medications   albuterol (2.5 MG/3ML) 0.083% nebulizer solution Commonly known as:  PROVENTIL Take 2.5 mg by nebulization every 6 (six) hours as needed for wheezing or shortness of breath.   aspirin 81 MG chewable tablet Chew 1 tablet (81 mg total) by mouth daily.   atorvastatin 40 MG tablet Commonly known as:  LIPITOR Take 1 tablet (40 mg total) by mouth daily at 6 PM.   beclomethasone 40 MCG/ACT inhaler Commonly known as:  QVAR Inhale 2 puffs into the lungs daily as needed (for shortness of breath).   bumetanide 2 MG tablet Commonly known as:  BUMEX Take 2 mg by mouth 3 (three) times daily.   carvedilol 25 MG tablet Commonly known as:  COREG Take 1.5 tablets (37.5 mg total) by mouth 2 (two) times daily with a meal. What changed:  additional instructions   docusate sodium 100 MG capsule Commonly known as:  COLACE Take 1 capsule (100 mg total) by mouth 2 (two) times daily.   ferrous gluconate 324 MG tablet Commonly known as:  FERGON Take 1 tablet (324 mg total) by mouth 2 (two) times daily with a meal.   fluticasone 220 MCG/ACT inhaler Commonly known as:  FLOVENT HFA Inhale 2 puffs into the lungs 2 (two) times daily.   OXYGEN Inhale 3.5 L into the lungs daily.   pantoprazole 40 MG tablet Commonly known as:  PROTONIX Take 1 tablet (40 mg total) by mouth daily at 6 (six) AM. What changed:  when to take this   potassium chloride SA 20 MEQ tablet Commonly known as:  K-DUR,KLOR-CON Take 1 tablet (20 mEq  total) by mouth 2 (two) times daily.   rivaroxaban 20 MG Tabs tablet Commonly known as:  XARELTO Take 1 tablet (20 mg total) by mouth daily with breakfast. What changed:  when to take this   spironolactone 100 MG tablet Commonly known as:  ALDACTONE Take 100 mg by mouth daily. For fluid   tamsulosin 0.4 MG Caps capsule Commonly known as:  FLOMAX Take 1 capsule (0.4 mg total) by mouth daily.      Follow-up Information    Samuel Grippe, MD. Schedule an appointment as soon as possible for a visit in 1 week(s).   Specialty:  Internal Medicine Why:  appointment for 1 week  Contact information: [address] [place] [address] [phone]        [name], MD On 08/19/2018.   Specialty:  Cardiology Why:  at 10:40 am Contact information: [address] [place] [address] [phone]          Allergies  Allergen Reactions  .  Banana Shortness Of Breath  . Bee Venom Shortness Of Breath, Swelling and Other (See Comments)    Reaction:  Facial swelling  . Food Shortness Of Breath, Rash and Other (See Comments)    Pt states that he is allergic to strawberries.    . Aspirin Other (See Comments)    Reaction:  GI upset   . Metolazone Other (See Comments)    Pt states that he stopped taking this med due to heart attack like symptoms.   . Oatmeal Nausea And Vomiting  . Orange Juice [Orange Oil] Nausea And Vomiting    All acidic products make him nauseous and upset stomach  . Torsemide Swelling and Other (See Comments)    Reaction:  Swelling of feet/legs   . Diltiazem Palpitations  . Hydralazine Palpitations  . Lipitor [Atorvastatin] Other (See Comments)    Reaction:  Nose bleeds     You were cared for by a hospitalist during your hospital stay. If you have any questions about your discharge medications or the care you received while you were in the hospital after you are discharged, you can call the unit and asked to speak with the hospitalist  on call if the hospitalist that took care of you is not available. Once you are discharged, your primary care physician will handle any further medical issues. Please note that no refills for any discharge medications will be authorized once you are discharged, as it is imperative that you return to your primary care physician (or establish a relationship with a primary care physician if you do not have one) for your aftercare needs so that they can reassess your need for medications and monitor your lab values.  Consultations:  None   Procedures/Studies: Dg Chest 2 View  Result Date: 07/22/2018 CLINICAL DATA:  Shortness of breath for 2 days. EXAM: CHEST - 2 VIEW COMPARISON:  PA and lateral chest 07/21/2018 and 05/11/2017. Single-view of the chest 07/02/2018 and 09/03/2017 FINDINGS: Cardiomegaly and chronic vascular congestion are again seen. No consolidative process, pneumothorax or effusion. No acute or focal bony abnormality. IMPRESSION: Cardiomegaly and pulmonary vascular congestion. Electronically Signed   By: Drusilla Kanner M.D.   On: 07/22/2018 16:49   Dg Chest Portable 1 View  Result Date: 08/03/2018 CLINICAL DATA:  53 year old male with a history of leg edema shortness of breath EXAM: PORTABLE CHEST 1 VIEW COMPARISON:  07/25/2018, 07/22/2018, 07/21/2018 FINDINGS: Cardiomediastinal silhouette unchanged with cardiomegaly. Fullness in the central vasculature with interlobular septal thickening. No pneumothorax. No large pleural effusion. No displaced fracture. IMPRESSION: Cardiomegaly with evidence of mild pulmonary edema. Atypical infection not excluded. Electronically Signed   By: Gilmer Mor D.O.   On: 08/03/2018 19:03   Dg Chest Port 1 View  Result Date: 07/25/2018 CLINICAL DATA:  Acute systolic CHF EXAM: PORTABLE CHEST 1 VIEW COMPARISON:  07/22/2018 FINDINGS: Chronic cardiomegaly and vascular pedicle widening. Vascular congestion without Kerley lines or effusion. No pneumothorax  IMPRESSION: Cardiomegaly and vascular congestion. Electronically Signed   By: Marnee Spring M.D.   On: 07/25/2018 05:42      Subjective: Feels better.  Ambulating in the hallway saturating greater than 92% on room air.  States his lower extremity feels a little less swollen.   Discharge Exam: Vitals:   08/05/18 0500 08/05/18 0751  BP: 127/87   Pulse: (!) 101   Resp: (!) 24   Temp: 98.9 F (37.2 C)   SpO2: 96% 92%   Vitals:   08/04/18 2225 08/05/18 0457 08/05/18 0500  08/05/18 0751  BP: (!) 109/56  127/87   Pulse: 73  (!) 101   Resp: 18  (!) 24   Temp: 98.2 F (36.8 C)  98.9 F (37.2 C)   TempSrc: Oral  Oral   SpO2: 100%  96% 92%  Weight:  (!) 166.2 kg    Height:        General: Pt is alert, awake, not in acute distress Cardiovascular: RRR, S1/S2 +, no rubs, no gallops Respiratory: CTA bilaterally, no wheezing, no rhonchi Abdominal: Soft, NT, ND, bowel sounds + Extremities: no edema, no cyanosis Chronic bilateral lower extremity skin changes, chronic venous stasis dermatitis.   The results of significant diagnostics from this hospitalization (including imaging, microbiology, ancillary and laboratory) are listed below for reference.     Microbiology: No results found for this or any previous visit (from the past 240 hour(s)).   Labs: BNP (last 3 results) Recent Labs    07/02/18 2132 07/22/18 1646 08/03/18 1749  BNP 190.0* 166.0* 346.0*   Basic Metabolic Panel: Recent Labs  Lab 08/03/18 1749 08/04/18 0710 08/05/18 0532  NA 138 141 139  K 3.5 3.5 3.5  CL 108 105 102  CO2 24 27 28   GLUCOSE 118* 95 92  BUN 14 11 15   CREATININE 1.18 1.12 1.23  CALCIUM 8.5* 8.5* 8.5*  MG  --  1.9 2.1   Liver Function Tests: Recent Labs  Lab 08/03/18 1749  AST 17  ALT 14  ALKPHOS 64  BILITOT 1.4*  PROT 6.9  ALBUMIN 3.8   No results for input(s): LIPASE, AMYLASE in the last 168 hours. No results for input(s): AMMONIA in the last 168 hours. CBC: Recent Labs   Lab 08/03/18 1749  WBC 4.5  NEUTROABS 3.1  HGB 12.4*  HCT 39.6  MCV 90.2  PLT 169   Cardiac Enzymes: Recent Labs  Lab 08/03/18 1749  TROPONINI <0.03   BNP: Invalid input(s): POCBNP CBG: No results for input(s): GLUCAP in the last 168 hours. D-Dimer No results for input(s): DDIMER in the last 72 hours. Hgb A1c No results for input(s): HGBA1C in the last 72 hours. Lipid Profile No results for input(s): CHOL, HDL, LDLCALC, TRIG, CHOLHDL, LDLDIRECT in the last 72 hours. Thyroid function studies No results for input(s): TSH, T4TOTAL, T3FREE, THYROIDAB in the last 72 hours.  Invalid input(s): FREET3 Anemia work up No results for input(s): VITAMINB12, FOLATE, FERRITIN, TIBC, IRON, RETICCTPCT in the last 72 hours. Urinalysis    Component Value Date/Time   COLORURINE YELLOW 04/01/2018 0332   APPEARANCEUR CLEAR 04/01/2018 0332   LABSPEC 1.008 04/01/2018 0332   PHURINE 7.0 04/01/2018 0332   GLUCOSEU NEGATIVE 04/01/2018 0332   GLUCOSEU NEG mg/dL 41/66/0630 1601   HGBUR NEGATIVE 04/01/2018 0332   BILIRUBINUR NEGATIVE 04/01/2018 0332   KETONESUR NEGATIVE 04/01/2018 0332   PROTEINUR NEGATIVE 04/01/2018 0332   UROBILINOGEN 0.2 08/08/2014 1445   NITRITE NEGATIVE 04/01/2018 0332   LEUKOCYTESUR NEGATIVE 04/01/2018 0332   Sepsis Labs Invalid input(s): PROCALCITONIN,  WBC,  LACTICIDVEN Microbiology No results found for this or any previous visit (from the past 240 hour(s)).   Time coordinating discharge:  I have spent 35 minutes face to face with the patient and on the ward discussing the patients care, assessment, plan and disposition with other care givers. >50% of the time was devoted counseling the patient about the risks and benefits of treatment/Discharge disposition and coordinating care.   SIGNED:   Dimple Nanas, MD  [facility] 08/05/2018, 11:33  AM   If 7PM-7AM, please contact night-coverage [email or web address]

## 2018-08-05 NOTE — Progress Notes (Signed)
SATURATION QUALIFICATIONS: (This note is used to comply with regulatory documentation for home oxygen)  Patient Saturations on Room Air at Rest = 95%  Patient Saturations on Room Air while Ambulating = 92%  Patient Saturations on Liters of oxygen while Ambulating = N/A  Please briefly explain why patient needs home oxygen: 

## 2018-08-05 NOTE — Progress Notes (Signed)
Patient has refused his CPAP tonight and requested just his nasal cannula to use while sleeping.

## 2018-08-05 NOTE — Care Management Important Message (Signed)
Important Message  Patient Details  Name: [name] MRN: [id number] Date of Birth: 1966-03-04   Medicare Important Message Given:  Yes    Corey Harold 08/05/2018, 11:07 AM

## 2018-08-05 NOTE — Progress Notes (Addendum)
Patient's heart rate dropped to 38 non-sustained , aslo had a 2.49 pause in heart rate. He denies cardiac discomfort or distress. Appears in no observed distress.  Vitals are WNL @ BP 110/62  HR56 T97.2 . RN paged provider with above finding. Will continue to monitor patient closely. RN also informed provider Opyd, Lavone Neri, MD of patient having a 23 beat run of V-tach. New orders have been  initiated for management of above status.  Will continue to monitor patient as shift progresses

## 2018-08-07 DIAGNOSIS — I5043 Acute on chronic combined systolic (congestive) and diastolic (congestive) heart failure: Secondary | ICD-10-CM | POA: Diagnosis not present

## 2018-08-07 DIAGNOSIS — Z9181 History of falling: Secondary | ICD-10-CM | POA: Diagnosis not present

## 2018-08-07 DIAGNOSIS — I252 Old myocardial infarction: Secondary | ICD-10-CM | POA: Diagnosis not present

## 2018-08-07 DIAGNOSIS — D649 Anemia, unspecified: Secondary | ICD-10-CM | POA: Diagnosis not present

## 2018-08-07 DIAGNOSIS — Z9981 Dependence on supplemental oxygen: Secondary | ICD-10-CM | POA: Diagnosis not present

## 2018-08-07 DIAGNOSIS — K219 Gastro-esophageal reflux disease without esophagitis: Secondary | ICD-10-CM | POA: Diagnosis not present

## 2018-08-07 DIAGNOSIS — I482 Chronic atrial fibrillation, unspecified: Secondary | ICD-10-CM | POA: Diagnosis not present

## 2018-08-07 DIAGNOSIS — Z7901 Long term (current) use of anticoagulants: Secondary | ICD-10-CM | POA: Diagnosis not present

## 2018-08-07 DIAGNOSIS — E785 Hyperlipidemia, unspecified: Secondary | ICD-10-CM | POA: Diagnosis not present

## 2018-08-07 DIAGNOSIS — M1991 Primary osteoarthritis, unspecified site: Secondary | ICD-10-CM | POA: Diagnosis not present

## 2018-08-07 DIAGNOSIS — J45909 Unspecified asthma, uncomplicated: Secondary | ICD-10-CM | POA: Diagnosis not present

## 2018-08-07 DIAGNOSIS — K279 Peptic ulcer, site unspecified, unspecified as acute or chronic, without hemorrhage or perforation: Secondary | ICD-10-CM | POA: Diagnosis not present

## 2018-08-07 DIAGNOSIS — I2510 Atherosclerotic heart disease of native coronary artery without angina pectoris: Secondary | ICD-10-CM | POA: Diagnosis not present

## 2018-08-07 DIAGNOSIS — G4733 Obstructive sleep apnea (adult) (pediatric): Secondary | ICD-10-CM | POA: Diagnosis not present

## 2018-08-07 DIAGNOSIS — I110 Hypertensive heart disease with heart failure: Secondary | ICD-10-CM | POA: Diagnosis not present

## 2018-08-07 NOTE — Progress Notes (Signed)
IV discontinued,catheter intact. Patient discharged with instructions given on medications and follow up visits,patient verbalized understanding.Accompanied by staff to an awaiting vehicle. 

## 2018-08-08 ENCOUNTER — Other Ambulatory Visit: Payer: Self-pay

## 2018-08-08 ENCOUNTER — Ambulatory Visit: Payer: Medicare Other | Admitting: Gastroenterology

## 2018-08-08 NOTE — Patient Outreach (Signed)
[facility]) Care Management  08/08/2018  [name] 1965/07/19 [id number]   Brief outreach with [name] and his mother. [name] reported feeling     "a lot better" since hospital discharge. Reviewed medications and discharge instructions. Reported taking all medications as prescribed, monitoring sodium intake and monitoring weights. Reported he is attempting to monitor intake and output. He was unable to recall exact weight today but stated his weight was "down two pounds." Denied complaints of chest discomfort, shortness of breath or increased edema.  Outreach with [facility] heart failure team. Confirmed resumption of services but pending approval for IV diuresis. [name] was agreeable to [facility] attending Cardiology appointment on 08/19/18. Will develop plan based on updates and Cardiology recommendations. [name] agreed to attend appointment as scheduled and contact RNCM if needed prior to visit. PLAN Initial assessment pending.  Katha Cabal [facility] Community Care Manager [phone]

## 2018-08-08 NOTE — Patient Outreach (Addendum)
[facility]) Care Management  08/08/2018  TYLYN BLAUSTEIN 1965/12/12 [id number]  Successful outreach call to Mr. [name].  HIPAA identifiers verified.   Mr. Kollin reports that he is feeling "pretty good."  He states that "they got off a lot of fluid" during his last hospitalization.  He reports that he will see his cardiologist on 2/28 and that he does not have any transportation issues getting to this appoinment.   He reports that he has called the [facility] clinic to schedule an appointment with his PCP.  When questioned if he was taking his bumex threes times a day and his aldactone he responded, "yeah, yeah, yeah."   Objective: Lab Results  Component Value Date   CREATININE 1.23 08/05/2018   CREATININE 1.12 08/04/2018   CREATININE 1.18 08/03/2018    Lab Results  Component Value Date   HGBA1C 5.7 (H) 04/04/2016    Lipid Panel     Component Value Date/Time   CHOL 139 06/14/2015 0328   TRIG 58 06/14/2015 0328   HDL 28 (L) 06/14/2015 0328   CHOLHDL 5.0 06/14/2015 0328   VLDL 12 06/14/2015 0328   LDLCALC 99 06/14/2015 0328    BP Readings from Last 3 Encounters:  08/05/18 127/87  07/25/18 (!) 143/93  07/06/18 123/80    Allergies  Allergen Reactions  . Banana Shortness Of Breath  . Bee Venom Shortness Of Breath, Swelling and Other (See Comments)    Reaction:  Facial swelling  . Food Shortness Of Breath, Rash and Other (See Comments)    Pt states that he is allergic to strawberries.    . Aspirin Other (See Comments)    Reaction:  GI upset   . Metolazone Other (See Comments)    Pt states that he stopped taking this med due to heart attack like symptoms.   . Oatmeal Nausea And Vomiting  . Orange Juice [Orange Oil] Nausea And Vomiting    All acidic products make him nauseous and upset stomach  . Torsemide Swelling and Other (See Comments)    Reaction:  Swelling of feet/legs   . Diltiazem Palpitations  . Hydralazine Palpitations  . Lipitor [Atorvastatin]  Other (See Comments)    Reaction:  Nose bleeds     Medications Reviewed Today    Reviewed by Hurley Cisco, [facility] (Pharmacist) on 08/08/18 at 1044  Med List Status: <None>  Medication Order Taking? Sig Documenting Provider Last Dose Status Informant  albuterol (PROVENTIL) (2.5 MG/3ML) 0.083% nebulizer solution [id number] Yes Take 2.5 mg by nebulization every 6 (six) hours as needed for wheezing or shortness of breath. [provider] Taking Active Self           Med Note Allyne Gee, Irven Easterly Mar 04, 2017  8:53 PM)    aspirin 81 MG chewable tablet [id number] Yes Chew 1 tablet (81 mg total) by mouth daily. Erick Blinks, MD Taking Active Self           Med Note (Meganne Rita, Lynita Lombard   Fri Jul 01, 2018 12:27 PM)    atorvastatin (LIPITOR) 40 MG tablet [id number] Yes Take 1 tablet (40 mg total) by mouth daily at 6 PM. Kathlen Mody, MD Taking Active Self  beclomethasone (QVAR) 40 MCG/ACT inhaler [id number] Yes Inhale 2 puffs into the lungs daily as needed (for shortness of breath). [provider] Taking Active            Med Note Allyne Gee, Starling Manns Aug 03, 2018  5:52 PM) Patient reported-pharmacy records do not support this  bumetanide (BUMEX) 2 MG tablet [id number] Yes Take 2 mg by mouth 3 (three) times daily. [provider] Taking Active Self  carvedilol (COREG) 25 MG tablet [id number] Yes Take 1.5 tablets (37.5 mg total) by mouth 2 (two) times daily with a meal.  Patient taking differently:  Take 37.5 mg by mouth 2 (two) times daily with a meal. Patient reports he is only taking 1 tablet twice daily- 07/01/17   [name], MD Taking Active Self  docusate sodium (COLACE) 100 MG capsule [id number] Yes Take 1 capsule (100 mg total) by mouth 2 (two) times daily. Kathlen Mody, MD Taking Active Self  ferrous gluconate (FERGON) 324 MG tablet [id number] Yes Take 1 tablet (324 mg total) by mouth 2 (two) times daily with a meal. Kathlen Mody, MD Taking Active Self   fluticasone (FLOVENT HFA) 220 MCG/ACT inhaler [id number] Yes Inhale 2 puffs into the lungs 2 (two) times daily. [provider] Taking Active Self  OXYGEN [id number] Yes Inhale 3.5 L into the lungs daily. [provider] Taking Active Self           Med Note Reva Bores, Helaine Chess   Fri Jul 22, 2018  5:23 PM)    pantoprazole (PROTONIX) 40 MG tablet [id number] Yes Take 1 tablet (40 mg total) by mouth daily at 6 (six) AM.  Patient taking differently:  Take 40 mg by mouth every evening.    Kathlen Mody, MD Taking Active Self        Discontinued 09/03/15 [phone] (Entry Error)   potassium chloride SA (K-DUR,KLOR-CON) 20 MEQ tablet [id number] Yes Take 1 tablet (20 mEq total) by mouth 2 (two) times daily. Sherryll Burger, Pratik D, DO Taking Active Self  rivaroxaban (XARELTO) 20 MG TABS tablet [id number] Yes Take 1 tablet (20 mg total) by mouth daily with breakfast.  Patient taking differently:  Take 20 mg by mouth daily with supper.    Kathlen Mody, MD Taking Active Self           Med Note Allyne Gee, Starling Manns Aug 03, 2018  5:49 PM)    spironolactone (ALDACTONE) 100 MG tablet [id number] Yes Take 100 mg by mouth daily. For fluid [provider] Taking Active Self  tamsulosin (FLOMAX) 0.4 MG CAPS capsule [id number] Yes Take 1 capsule (0.4 mg total) by mouth daily. Kathlen Mody, MD Taking Active Self  Med List Note Landis Gandy 07/22/18 1615): Patient also uses [facility]          ASSESSMENT: Date Discharged from Hospital: 08/05/18 Date Medication Reconciliation Performed: 08/08/2018  Discontinued at Discharge:   Indomethacin  Patient was recently discharged from hospital and all medications have been reviewed.  Assessment:  Drugs sorted by system:  Cardiovascular: aspirin 81 mg, atorvastatin, bumetanide, carvedilol, potassium chloride, rivaroxaban, spironolactone  Pulmonary/Allergy: albuterol neb, beclomethasone, fluticasone   Gastrointestinal: docusate,  pantoprazole  Genitourinary: tamsulosin  Vitamins/Minerals/Supplements: ferrous gluconate  Medication Review Findings:  . Mr. Calfee still states that he is compliant with his diuretic regimen.  Unsure if this is the case, since he is adamant that oral diuretics don't work for him and continues to be re- hospitalized for CHF exacerbation.  Counseled him on the importance of taking all of his medications, especially the diuretics as prescribed.  He verbalized understanding.   Medication Assistance Findings:  No medication assistance needs identified.  Plan: Will outreach to Mr. Ozawa next week.   [name], PharmD Clinical  Pharmacist [facility] [phone]

## 2018-08-09 IMAGING — CR DG CHEST 1V PORT
1 series · 2 of 2 positions shown · non-contrast
Comparison: 11/25/2016 and prior exams

CLINICAL DATA: Acute shortness of breath and cough.

EXAM:
PORTABLE CHEST 1 VIEW

[Series 1: portable · 0.17mm/px · 2 of 2 slices shown]
[im 1/2]
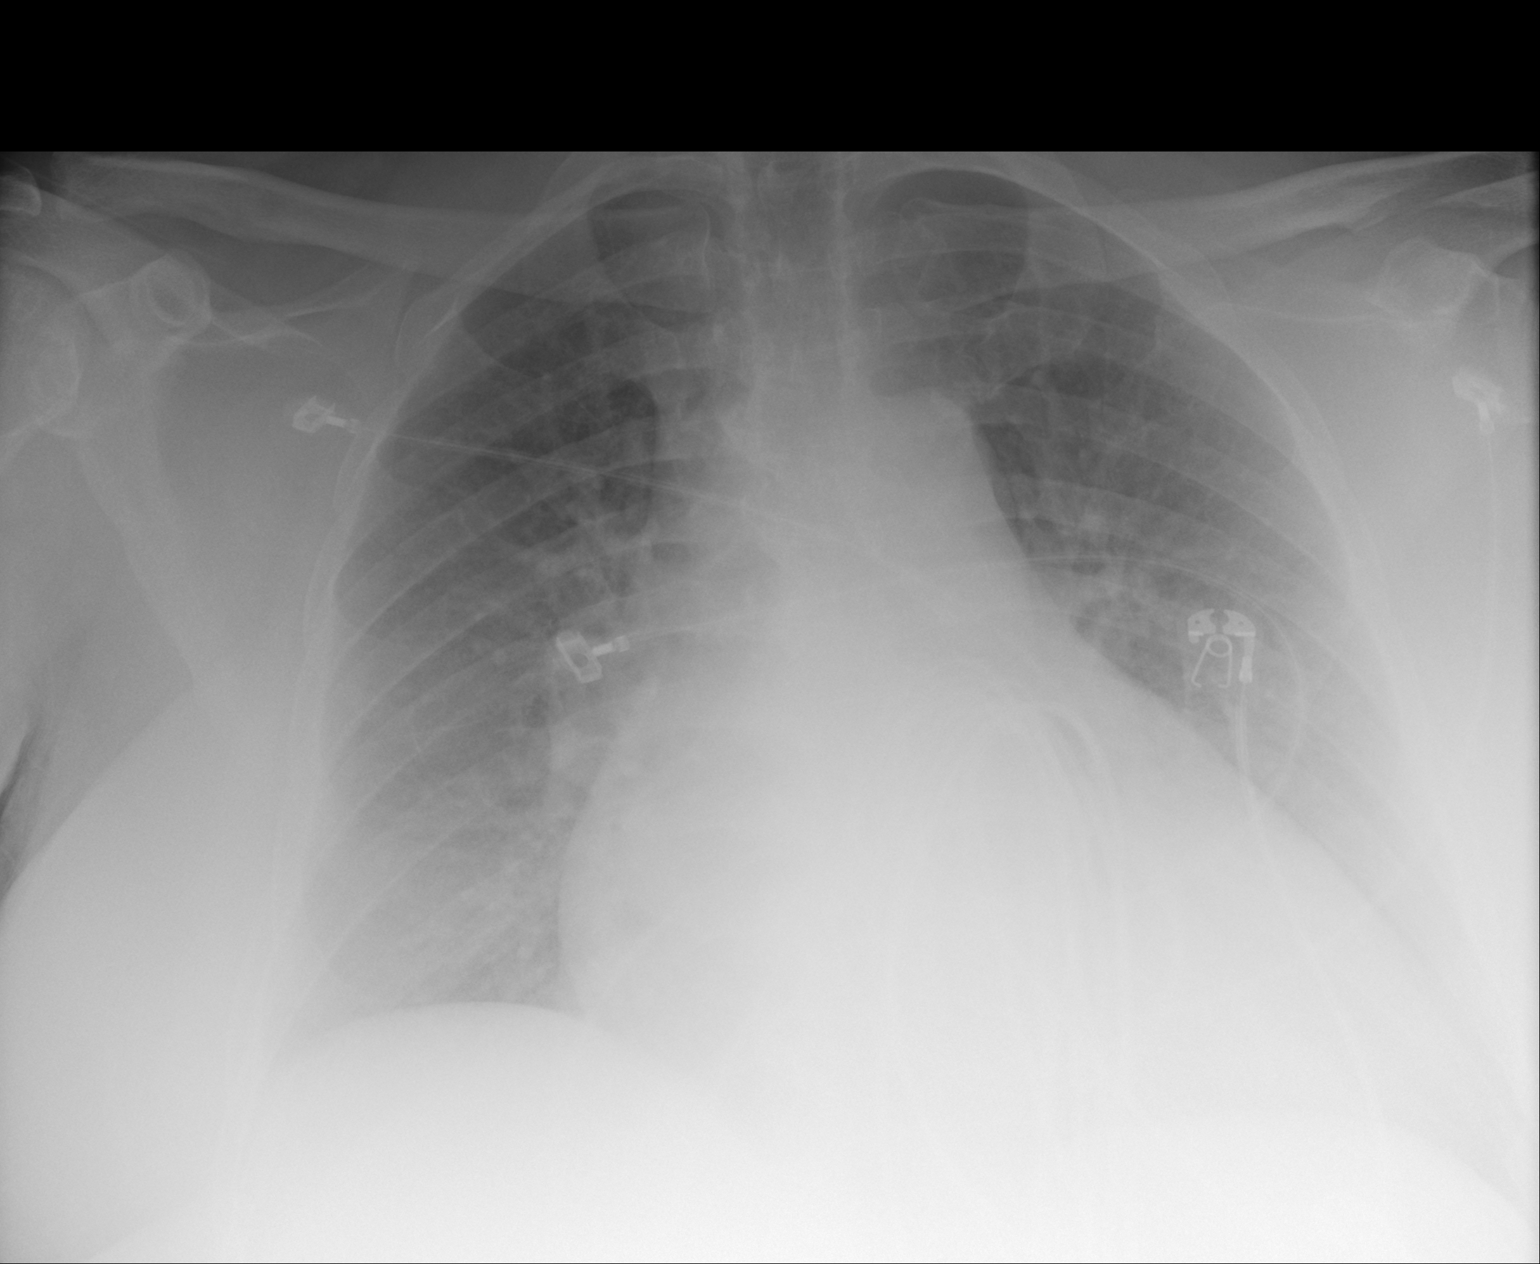
[im 2/2]
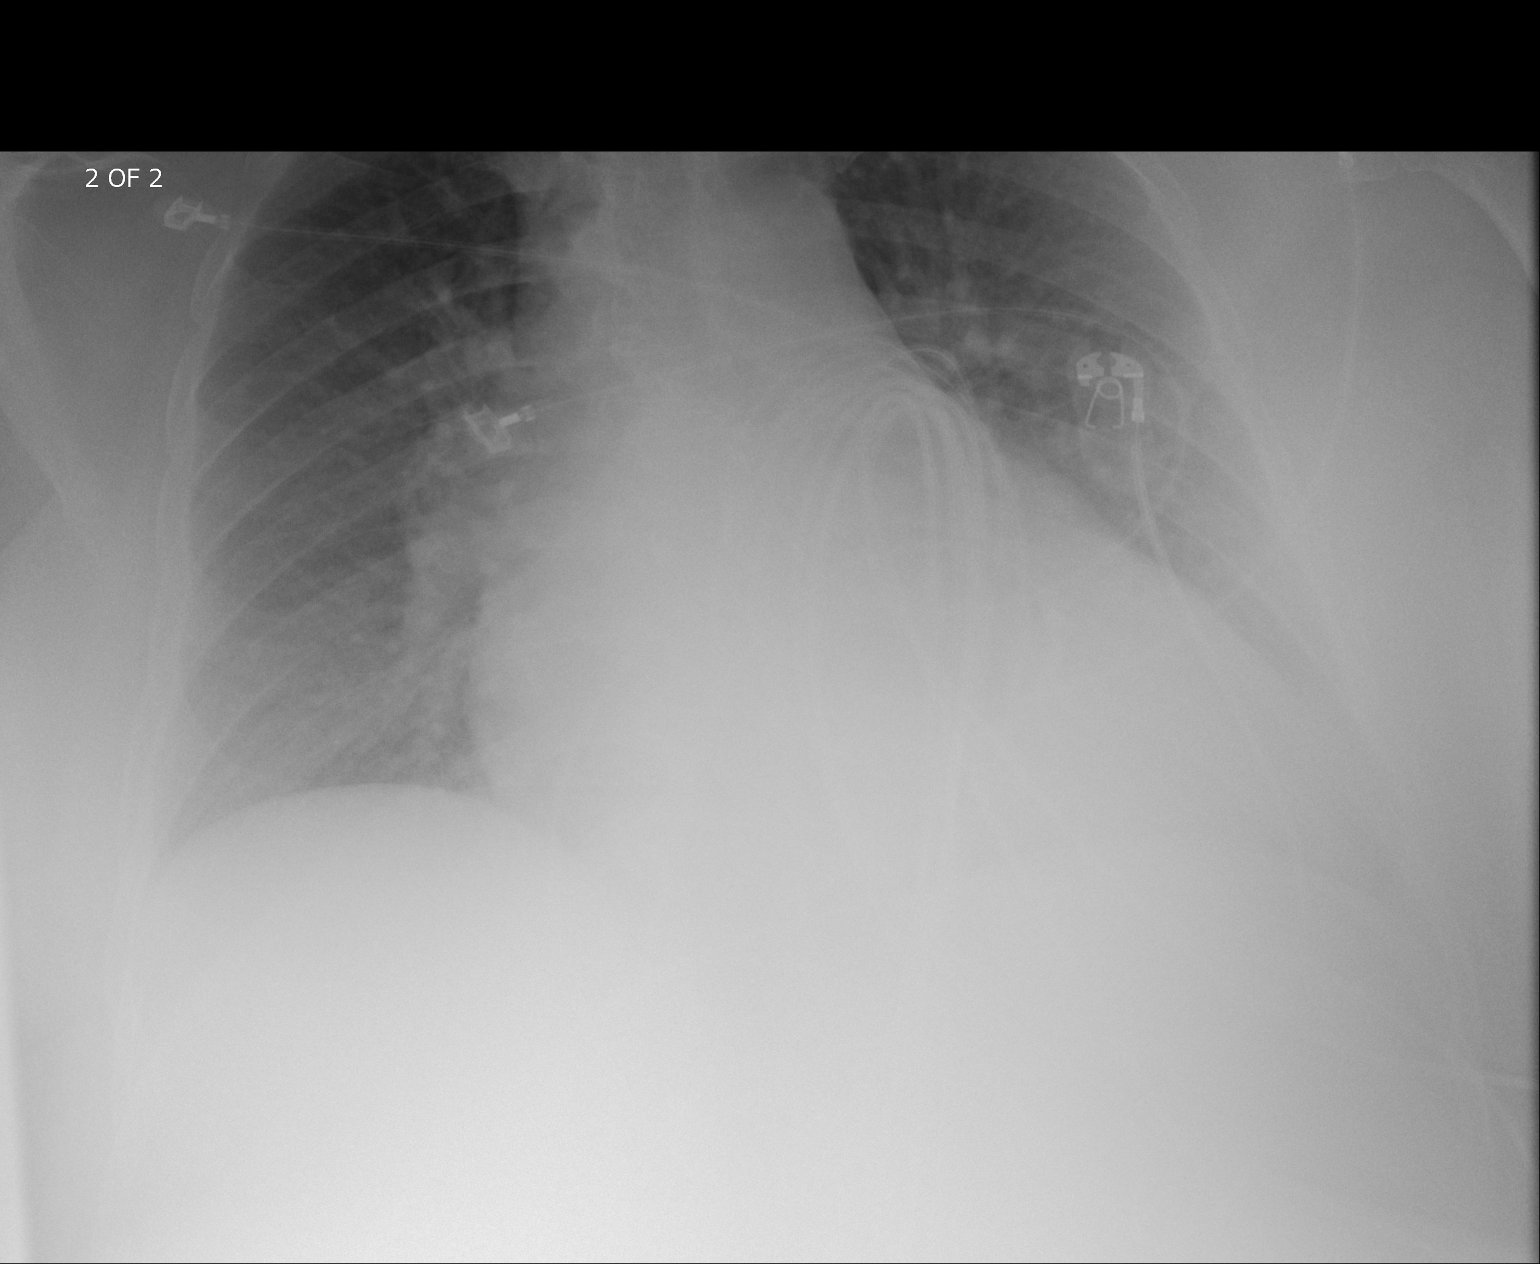

[2 of 2 positions shown; findings below may reference images not displayed]

FINDINGS: Cardiomegaly and pulmonary vascular congestion again noted.

There is no evidence of focal airspace disease, pulmonary edema,
suspicious pulmonary nodule/mass, pleural effusion, or pneumothorax.

No acute bony abnormalities are identified.
IMPRESSION: Cardiomegaly and pulmonary vascular congestion.

## 2018-08-10 DIAGNOSIS — K279 Peptic ulcer, site unspecified, unspecified as acute or chronic, without hemorrhage or perforation: Secondary | ICD-10-CM | POA: Diagnosis not present

## 2018-08-10 DIAGNOSIS — I10 Essential (primary) hypertension: Secondary | ICD-10-CM | POA: Diagnosis not present

## 2018-08-10 DIAGNOSIS — K219 Gastro-esophageal reflux disease without esophagitis: Secondary | ICD-10-CM | POA: Diagnosis not present

## 2018-08-10 DIAGNOSIS — M1991 Primary osteoarthritis, unspecified site: Secondary | ICD-10-CM | POA: Diagnosis not present

## 2018-08-10 DIAGNOSIS — E785 Hyperlipidemia, unspecified: Secondary | ICD-10-CM | POA: Diagnosis not present

## 2018-08-10 DIAGNOSIS — I5043 Acute on chronic combined systolic (congestive) and diastolic (congestive) heart failure: Secondary | ICD-10-CM | POA: Diagnosis not present

## 2018-08-10 DIAGNOSIS — I110 Hypertensive heart disease with heart failure: Secondary | ICD-10-CM | POA: Diagnosis not present

## 2018-08-10 DIAGNOSIS — I252 Old myocardial infarction: Secondary | ICD-10-CM | POA: Diagnosis not present

## 2018-08-10 DIAGNOSIS — I509 Heart failure, unspecified: Secondary | ICD-10-CM | POA: Diagnosis not present

## 2018-08-10 DIAGNOSIS — J45909 Unspecified asthma, uncomplicated: Secondary | ICD-10-CM | POA: Diagnosis not present

## 2018-08-10 DIAGNOSIS — J811 Chronic pulmonary edema: Secondary | ICD-10-CM | POA: Diagnosis not present

## 2018-08-10 DIAGNOSIS — Z7901 Long term (current) use of anticoagulants: Secondary | ICD-10-CM | POA: Diagnosis not present

## 2018-08-10 DIAGNOSIS — I482 Chronic atrial fibrillation, unspecified: Secondary | ICD-10-CM | POA: Diagnosis not present

## 2018-08-10 DIAGNOSIS — Z9981 Dependence on supplemental oxygen: Secondary | ICD-10-CM | POA: Diagnosis not present

## 2018-08-10 DIAGNOSIS — I480 Paroxysmal atrial fibrillation: Secondary | ICD-10-CM | POA: Diagnosis not present

## 2018-08-10 DIAGNOSIS — G4733 Obstructive sleep apnea (adult) (pediatric): Secondary | ICD-10-CM | POA: Diagnosis not present

## 2018-08-10 DIAGNOSIS — Z9181 History of falling: Secondary | ICD-10-CM | POA: Diagnosis not present

## 2018-08-10 DIAGNOSIS — I5042 Chronic combined systolic (congestive) and diastolic (congestive) heart failure: Secondary | ICD-10-CM | POA: Diagnosis not present

## 2018-08-10 DIAGNOSIS — D649 Anemia, unspecified: Secondary | ICD-10-CM | POA: Diagnosis not present

## 2018-08-10 DIAGNOSIS — I2510 Atherosclerotic heart disease of native coronary artery without angina pectoris: Secondary | ICD-10-CM | POA: Diagnosis not present

## 2018-08-11 DIAGNOSIS — I5043 Acute on chronic combined systolic (congestive) and diastolic (congestive) heart failure: Secondary | ICD-10-CM | POA: Diagnosis not present

## 2018-08-12 DIAGNOSIS — E785 Hyperlipidemia, unspecified: Secondary | ICD-10-CM | POA: Diagnosis not present

## 2018-08-12 DIAGNOSIS — J45909 Unspecified asthma, uncomplicated: Secondary | ICD-10-CM | POA: Diagnosis not present

## 2018-08-12 DIAGNOSIS — I2510 Atherosclerotic heart disease of native coronary artery without angina pectoris: Secondary | ICD-10-CM | POA: Diagnosis not present

## 2018-08-12 DIAGNOSIS — Z9181 History of falling: Secondary | ICD-10-CM | POA: Diagnosis not present

## 2018-08-12 DIAGNOSIS — G4733 Obstructive sleep apnea (adult) (pediatric): Secondary | ICD-10-CM | POA: Diagnosis not present

## 2018-08-12 DIAGNOSIS — Z7901 Long term (current) use of anticoagulants: Secondary | ICD-10-CM | POA: Diagnosis not present

## 2018-08-12 DIAGNOSIS — I482 Chronic atrial fibrillation, unspecified: Secondary | ICD-10-CM | POA: Diagnosis not present

## 2018-08-12 DIAGNOSIS — I252 Old myocardial infarction: Secondary | ICD-10-CM | POA: Diagnosis not present

## 2018-08-12 DIAGNOSIS — I110 Hypertensive heart disease with heart failure: Secondary | ICD-10-CM | POA: Diagnosis not present

## 2018-08-12 DIAGNOSIS — K279 Peptic ulcer, site unspecified, unspecified as acute or chronic, without hemorrhage or perforation: Secondary | ICD-10-CM | POA: Diagnosis not present

## 2018-08-12 DIAGNOSIS — D649 Anemia, unspecified: Secondary | ICD-10-CM | POA: Diagnosis not present

## 2018-08-12 DIAGNOSIS — M1991 Primary osteoarthritis, unspecified site: Secondary | ICD-10-CM | POA: Diagnosis not present

## 2018-08-12 DIAGNOSIS — I5043 Acute on chronic combined systolic (congestive) and diastolic (congestive) heart failure: Secondary | ICD-10-CM | POA: Diagnosis not present

## 2018-08-12 DIAGNOSIS — Z9981 Dependence on supplemental oxygen: Secondary | ICD-10-CM | POA: Diagnosis not present

## 2018-08-12 DIAGNOSIS — K219 Gastro-esophageal reflux disease without esophagitis: Secondary | ICD-10-CM | POA: Diagnosis not present

## 2018-08-15 ENCOUNTER — Inpatient Hospital Stay (HOSPITAL_COMMUNITY)
Admission: EM | Admit: 2018-08-15 | Discharge: 2018-08-18 | DRG: 292 | Disposition: A | Discharge status: Home / Self Care | Payer: Medicare Other | Attending: Internal Medicine | Admitting: Internal Medicine

## 2018-08-15 ENCOUNTER — Encounter (HOSPITAL_COMMUNITY): Payer: Self-pay | Admitting: *Deleted

## 2018-08-15 ENCOUNTER — Emergency Department (HOSPITAL_COMMUNITY): Payer: Medicare Other

## 2018-08-15 ENCOUNTER — Other Ambulatory Visit: Payer: Self-pay

## 2018-08-15 DIAGNOSIS — Z7982 Long term (current) use of aspirin: Secondary | ICD-10-CM | POA: Diagnosis not present

## 2018-08-15 DIAGNOSIS — G4733 Obstructive sleep apnea (adult) (pediatric): Secondary | ICD-10-CM | POA: Diagnosis not present

## 2018-08-15 DIAGNOSIS — D649 Anemia, unspecified: Secondary | ICD-10-CM | POA: Diagnosis not present

## 2018-08-15 DIAGNOSIS — Z6841 Body Mass Index (BMI) 40.0 and over, adult: Secondary | ICD-10-CM

## 2018-08-15 DIAGNOSIS — Z823 Family history of stroke: Secondary | ICD-10-CM

## 2018-08-15 DIAGNOSIS — I482 Chronic atrial fibrillation, unspecified: Secondary | ICD-10-CM | POA: Diagnosis not present

## 2018-08-15 DIAGNOSIS — Z7901 Long term (current) use of anticoagulants: Secondary | ICD-10-CM

## 2018-08-15 DIAGNOSIS — I5043 Acute on chronic combined systolic (congestive) and diastolic (congestive) heart failure: Secondary | ICD-10-CM | POA: Diagnosis not present

## 2018-08-15 DIAGNOSIS — Z8249 Family history of ischemic heart disease and other diseases of the circulatory system: Secondary | ICD-10-CM | POA: Diagnosis not present

## 2018-08-15 DIAGNOSIS — Z9981 Dependence on supplemental oxygen: Secondary | ICD-10-CM

## 2018-08-15 DIAGNOSIS — K219 Gastro-esophageal reflux disease without esophagitis: Secondary | ICD-10-CM | POA: Diagnosis present

## 2018-08-15 DIAGNOSIS — E785 Hyperlipidemia, unspecified: Secondary | ICD-10-CM | POA: Diagnosis not present

## 2018-08-15 DIAGNOSIS — I2510 Atherosclerotic heart disease of native coronary artery without angina pectoris: Secondary | ICD-10-CM | POA: Diagnosis not present

## 2018-08-15 DIAGNOSIS — I10 Essential (primary) hypertension: Secondary | ICD-10-CM | POA: Diagnosis not present

## 2018-08-15 DIAGNOSIS — E876 Hypokalemia: Secondary | ICD-10-CM | POA: Diagnosis present

## 2018-08-15 DIAGNOSIS — E7849 Other hyperlipidemia: Secondary | ICD-10-CM | POA: Diagnosis not present

## 2018-08-15 DIAGNOSIS — Z87891 Personal history of nicotine dependence: Secondary | ICD-10-CM | POA: Diagnosis not present

## 2018-08-15 DIAGNOSIS — R601 Generalized edema: Secondary | ICD-10-CM | POA: Diagnosis not present

## 2018-08-15 DIAGNOSIS — I499 Cardiac arrhythmia, unspecified: Secondary | ICD-10-CM | POA: Diagnosis not present

## 2018-08-15 DIAGNOSIS — I110 Hypertensive heart disease with heart failure: Secondary | ICD-10-CM | POA: Diagnosis not present

## 2018-08-15 DIAGNOSIS — I509 Heart failure, unspecified: Secondary | ICD-10-CM

## 2018-08-15 DIAGNOSIS — I491 Atrial premature depolarization: Secondary | ICD-10-CM | POA: Diagnosis not present

## 2018-08-15 DIAGNOSIS — J45909 Unspecified asthma, uncomplicated: Secondary | ICD-10-CM | POA: Diagnosis not present

## 2018-08-15 DIAGNOSIS — I5033 Acute on chronic diastolic (congestive) heart failure: Secondary | ICD-10-CM | POA: Diagnosis not present

## 2018-08-15 DIAGNOSIS — R0689 Other abnormalities of breathing: Secondary | ICD-10-CM | POA: Diagnosis not present

## 2018-08-15 DIAGNOSIS — I252 Old myocardial infarction: Secondary | ICD-10-CM | POA: Diagnosis not present

## 2018-08-15 DIAGNOSIS — I4891 Unspecified atrial fibrillation: Secondary | ICD-10-CM | POA: Diagnosis not present

## 2018-08-15 DIAGNOSIS — R0602 Shortness of breath: Secondary | ICD-10-CM | POA: Diagnosis not present

## 2018-08-15 LAB — CBC WITH DIFFERENTIAL/PLATELET
Abs Immature Granulocytes: 0.01 10*3/uL (ref 0.00–0.07)
Basophils Absolute: 0 10*3/uL (ref 0.0–0.1)
Basophils Relative: 0 %
Eosinophils Absolute: 0.1 10*3/uL (ref 0.0–0.5)
Eosinophils Relative: 3 %
HCT: 40.8 % (ref 39.0–52.0)
Hemoglobin: 12.3 g/dL — ABNORMAL LOW (ref 13.0–17.0)
Immature Granulocytes: 0 %
Lymphocytes Relative: 20 %
Lymphs Abs: 0.8 10*3/uL (ref 0.7–4.0)
MCH: 27.2 pg (ref 26.0–34.0)
MCHC: 30.1 g/dL (ref 30.0–36.0)
MCV: 90.1 fL (ref 80.0–100.0)
MONOS PCT: 9 %
Monocytes Absolute: 0.4 10*3/uL (ref 0.1–1.0)
Neutro Abs: 2.9 10*3/uL (ref 1.7–7.7)
Neutrophils Relative %: 68 %
Platelets: 184 10*3/uL (ref 150–400)
RBC: 4.53 MIL/uL (ref 4.22–5.81)
RDW: 14.2 % (ref 11.5–15.5)
WBC: 4.2 10*3/uL (ref 4.0–10.5)
nRBC: 0 % (ref 0.0–0.2)

## 2018-08-15 LAB — BRAIN NATRIURETIC PEPTIDE: B Natriuretic Peptide: 287 pg/mL — ABNORMAL HIGH (ref 0.0–100.0)

## 2018-08-15 LAB — BASIC METABOLIC PANEL
Anion gap: 6 (ref 5–15)
BUN: 14 mg/dL (ref 6–20)
CO2: 26 mmol/L (ref 22–32)
CREATININE: 1.14 mg/dL (ref 0.61–1.24)
Calcium: 8.5 mg/dL — ABNORMAL LOW (ref 8.9–10.3)
Chloride: 105 mmol/L (ref 98–111)
GFR calc Af Amer: >60 mL/min (ref >60)
GFR calc non Af Amer: >60 mL/min (ref >60)
GLUCOSE: 89 mg/dL (ref 70–99)
Potassium: 3.3 mmol/L — ABNORMAL LOW (ref 3.5–5.1)
Sodium: 137 mmol/L (ref 135–145)

## 2018-08-15 LAB — HEPATIC FUNCTION PANEL
ALT: 15 U/L (ref 0–44)
AST: 18 U/L (ref 15–41)
Albumin: 3.8 g/dL (ref 3.5–5.0)
Alkaline Phosphatase: 65 U/L (ref 38–126)
Bilirubin, Direct: 0.2 mg/dL (ref 0.0–0.2)
Indirect Bilirubin: 0.9 mg/dL (ref 0.3–0.9)
Total Bilirubin: 1.1 mg/dL (ref 0.3–1.2)
Total Protein: 6.9 g/dL (ref 6.5–8.1)

## 2018-08-15 LAB — TROPONIN I: Troponin I: <0.03 ng/mL (ref <0.03)

## 2018-08-15 LAB — PHOSPHORUS: Phosphorus: 4.4 mg/dL (ref 2.5–4.6)

## 2018-08-15 LAB — MAGNESIUM: Magnesium: 2.1 mg/dL (ref 1.7–2.4)

## 2018-08-15 MED ORDER — ACETAMINOPHEN 325 MG PO TABS: 650.0000 mg | ORAL_TABLET | Freq: Four times a day (QID) | ORAL | Status: DC | PRN

## 2018-08-15 MED ORDER — MAGNESIUM SULFATE 2 GM/50ML IV SOLN
2.0000 g | Freq: Once | INTRAVENOUS | Status: AC
  Administered 2018-08-15: 2 g via INTRAVENOUS
  Filled 2018-08-15: qty 50

## 2018-08-15 MED ORDER — BECLOMETHASONE DIPROPIONATE 40 MCG/ACT IN AERS: 2.0000 | INHALATION_SPRAY | Freq: Every day | RESPIRATORY_TRACT | Status: DC | PRN

## 2018-08-15 MED ORDER — FUROSEMIDE 10 MG/ML IJ SOLN
80.0000 mg | Freq: Two times a day (BID) | INTRAMUSCULAR | Status: DC
  Administered 2018-08-15 – 2018-08-18 (×7): 80 mg via INTRAVENOUS
  Filled 2018-08-15 (×7): qty 8

## 2018-08-15 MED ORDER — LISINOPRIL 5 MG PO TABS
2.5000 mg | ORAL_TABLET | Freq: Every day | ORAL | Status: DC
  Administered 2018-08-15 – 2018-08-18 (×4): 2.5 mg via ORAL
  Filled 2018-08-15 (×4): qty 1

## 2018-08-15 MED ORDER — BUDESONIDE 0.25 MG/2ML IN SUSP: 0.2500 mg | Freq: Every day | RESPIRATORY_TRACT | Status: DC | PRN

## 2018-08-15 MED ORDER — PANTOPRAZOLE SODIUM 40 MG PO TBEC
40.0000 mg | DELAYED_RELEASE_TABLET | Freq: Every evening | ORAL | Status: DC
  Administered 2018-08-15 – 2018-08-17 (×3): 40 mg via ORAL
  Filled 2018-08-15 (×3): qty 1

## 2018-08-15 MED ORDER — POTASSIUM CHLORIDE CRYS ER 20 MEQ PO TBCR
40.0000 meq | EXTENDED_RELEASE_TABLET | Freq: Once | ORAL | Status: AC
  Administered 2018-08-15: 40 meq via ORAL
  Filled 2018-08-15: qty 2

## 2018-08-15 MED ORDER — SPIRONOLACTONE 25 MG PO TABS
100.0000 mg | ORAL_TABLET | Freq: Every day | ORAL | Status: DC
  Administered 2018-08-15 – 2018-08-18 (×4): 100 mg via ORAL
  Filled 2018-08-15 (×4): qty 4

## 2018-08-15 MED ORDER — CARVEDILOL 12.5 MG PO TABS
25.0000 mg | ORAL_TABLET | Freq: Two times a day (BID) | ORAL | Status: DC
  Administered 2018-08-15 – 2018-08-18 (×6): 25 mg via ORAL
  Filled 2018-08-15 (×6): qty 2

## 2018-08-15 MED ORDER — ALBUTEROL SULFATE (2.5 MG/3ML) 0.083% IN NEBU
5.0000 mg | INHALATION_SOLUTION | Freq: Once | RESPIRATORY_TRACT | Status: AC
  Administered 2018-08-15: 5 mg via RESPIRATORY_TRACT
  Filled 2018-08-15: qty 6

## 2018-08-15 MED ORDER — RIVAROXABAN 20 MG PO TABS
20.0000 mg | ORAL_TABLET | Freq: Every day | ORAL | Status: DC
  Administered 2018-08-15 – 2018-08-17 (×3): 20 mg via ORAL
  Filled 2018-08-15 (×3): qty 1

## 2018-08-15 MED ORDER — NITROGLYCERIN 2 % TD OINT
1.0000 [in_us] | TOPICAL_OINTMENT | Freq: Once | TRANSDERMAL | Status: AC
  Administered 2018-08-15: 1 [in_us] via TOPICAL
  Filled 2018-08-15: qty 1

## 2018-08-15 MED ORDER — POTASSIUM CHLORIDE CRYS ER 20 MEQ PO TBCR
40.0000 meq | EXTENDED_RELEASE_TABLET | Freq: Two times a day (BID) | ORAL | Status: DC
  Administered 2018-08-15 – 2018-08-18 (×7): 40 meq via ORAL
  Filled 2018-08-15 (×7): qty 2

## 2018-08-15 MED ORDER — ALBUTEROL SULFATE (2.5 MG/3ML) 0.083% IN NEBU: 2.5000 mg | INHALATION_SOLUTION | RESPIRATORY_TRACT | Status: DC | PRN

## 2018-08-15 MED ORDER — ACETAMINOPHEN 650 MG RE SUPP: 650.0000 mg | Freq: Four times a day (QID) | RECTAL | Status: DC | PRN

## 2018-08-15 MED ORDER — FUROSEMIDE 10 MG/ML IJ SOLN
60.0000 mg | Freq: Once | INTRAMUSCULAR | Status: AC
  Administered 2018-08-15: 60 mg via INTRAVENOUS
  Filled 2018-08-15: qty 6

## 2018-08-15 NOTE — H&P (Signed)
History and Physical    ADAR BILLARD YPP:[id number] DOB: 07-Feb-1966 DOA: 08/15/2018  PCP: Pearson Grippe, MD   Patient coming from: Home.  I have personally briefly reviewed patient's old medical records in [facility]  Chief Complaint: Dyspnea.  HPI: [name] is a 53 y.o. male with medical history significant of allergy rhinitis, asthma, chronic atrial fibrillation, chronic combined systolic and diastolic heart failure, hypertension, GERD, CAD, history of MI, history of medication noncompliance, OSA, osteoarthritis, PUD who is coming to the emergency department with complaints of progressively worse dyspnea for the past 2 days.  Associated symptoms are orthopnea, lower extremity edema and fatigue.  He has gained about 9 pounds in the last few days.  He denies chest pain, palpitations, dizziness, diaphoresis, fever, chills, sore throat, rhinorrhea, hemoptysis, abdominal pain, nausea, emesis, diarrhea, constipation, hematochezia or melena.  He denies dysuria frequency or hematuria.  No blurred vision, polyuria, polydipsia or polyphagia.  ED Course: Initial vital signs pulse 102, respirations 17, blood pressure 156/121 mmHg and O2 sat 100% on nasal cannula oxygen.  He did receive supplemental oxygen, albuterol 5 mg via neb, furosemide 60 mg IVP x1 and a 1 inch 2% NTG ointment patch was placed on ACW.  His white count is 4.2 with a normal differential, hemoglobin 12.3 g/dL and platelets 458.  Troponin was normal.  BMP was 287.0 pg/mL.  EKG was atrial fibrillation with nonspecific T wave abnormalities and no significant change from previous tracing.  BMP shows a potassium of 3.3 mmol/L and calcium level of 8.5 mg/dL. His chest radiograph shows cardiomegaly with interstitial pulmonary and mild alveolar edema.  There is questionable right pleural effusion.  Please see images and full radiology report for further detail.  Review of Systems: As per HPI otherwise 10 point review of systems negative.     Past Medical History:  Diagnosis Date  . Allergic rhinitis   . Asthma   . Atrial fibrillation (HCC)   . CHF (congestive heart failure) (HCC)   . Essential hypertension   . Gastroesophageal reflux disease   . Heart attack (HCC)   . History of cardiac catheterization    Normal coronaries December 2016  . Noncompliance    Major problem leading to declining health and recurrent hospitalization  . Nonischemic cardiomyopathy (HCC)    LVEF 20-25%  . On home O2    3L N/C  . OSA (obstructive sleep apnea)   . Osteoarthritis   . Peptic ulcer disease     Past Surgical History:  Procedure Laterality Date  . CARDIAC CATHETERIZATION N/A 06/14/2015   Procedure: Right/Left Heart Cath and Coronary Angiography;  Surgeon: Runell Gess, MD;  Location: [facility] INVASIVE CV LAB;  Service: Cardiovascular;  Laterality: N/A;     reports that he quit smoking about 11 years ago. His smoking use included cigarettes. He started smoking about 30 years ago. He has a 10.00 pack-year smoking history. He has never used smokeless tobacco. He reports that he does not drink alcohol or use drugs.  Allergies  Allergen Reactions  . Banana Shortness Of Breath  . Bee Venom Shortness Of Breath, Swelling and Other (See Comments)    Reaction:  Facial swelling  . Food Shortness Of Breath, Rash and Other (See Comments)    Pt states that he is allergic to strawberries.    . Aspirin Other (See Comments)    Reaction:  GI upset   . Metolazone Other (See Comments)    Pt states that he  stopped taking this med due to heart attack like symptoms.   . Oatmeal Nausea And Vomiting  . Orange Juice [Orange Oil] Nausea And Vomiting    All acidic products make him nauseous and upset stomach  . Torsemide Swelling and Other (See Comments)    Reaction:  Swelling of feet/legs   . Diltiazem Palpitations  . Hydralazine Palpitations  . Lipitor [Atorvastatin] Other (See Comments)    Reaction:  Nose bleeds     Family History  Problem  Relation Age of Onset  . Stroke Father   . Heart attack Father   . Aneurysm Mother        Cerebral aneurysm  . Hypertension Sister   . Colon cancer Neg Hx   . Inflammatory bowel disease Neg Hx   . Liver disease Neg Hx    Prior to Admission medications   Medication Sig Start Date End Date Taking? Authorizing Provider  albuterol (PROVENTIL) (2.5 MG/3ML) 0.083% nebulizer solution Take 2.5 mg by nebulization every 6 (six) hours as needed for wheezing or shortness of breath.    [provider]  aspirin 81 MG chewable tablet Chew 1 tablet (81 mg total) by mouth daily. 04/13/17   Erick Blinks, MD  atorvastatin (LIPITOR) 40 MG tablet Take 1 tablet (40 mg total) by mouth daily at 6 PM. 06/25/16   Kathlen Mody, MD  beclomethasone (QVAR) 40 MCG/ACT inhaler Inhale 2 puffs into the lungs daily as needed (for shortness of breath).    [provider]  bumetanide (BUMEX) 2 MG tablet Take 2 mg by mouth 3 (three) times daily.    [provider]  carvedilol (COREG) 25 MG tablet Take 1.5 tablets (37.5 mg total) by mouth 2 (two) times daily with a meal. Patient taking differently: Take 37.5 mg by mouth 2 (two) times daily with a meal. Patient reports he is only taking 1 tablet twice daily- 07/01/17 10/07/17   [name], MD  docusate sodium (COLACE) 100 MG capsule Take 1 capsule (100 mg total) by mouth 2 (two) times daily. 06/25/16   Kathlen Mody, MD  ferrous gluconate (FERGON) 324 MG tablet Take 1 tablet (324 mg total) by mouth 2 (two) times daily with a meal. 06/25/16   Kathlen Mody, MD  fluticasone (FLOVENT HFA) 220 MCG/ACT inhaler Inhale 2 puffs into the lungs 2 (two) times daily.    [provider]  OXYGEN Inhale 3.5 L into the lungs daily.    [provider]  pantoprazole (PROTONIX) 40 MG tablet Take 1 tablet (40 mg total) by mouth daily at 6 (six) AM. Patient taking differently: Take 40 mg by mouth every evening.  06/26/16   Kathlen Mody, MD  potassium  chloride SA (K-DUR,KLOR-CON) 20 MEQ tablet Take 1 tablet (20 mEq total) by mouth 2 (two) times daily. 06/13/18   Sherryll Burger, Pratik D, DO  rivaroxaban (XARELTO) 20 MG TABS tablet Take 1 tablet (20 mg total) by mouth daily with breakfast. Patient taking differently: Take 20 mg by mouth daily with supper.  06/26/16   Kathlen Mody, MD  spironolactone (ALDACTONE) 100 MG tablet Take 100 mg by mouth daily. For fluid 03/18/17   [provider]  tamsulosin (FLOMAX) 0.4 MG CAPS capsule Take 1 capsule (0.4 mg total) by mouth daily. 06/25/16   Kathlen Mody, MD  potassium chloride 20 MEQ TBCR Take 20 mEq by mouth 2 (two) times daily. 09/03/15 09/03/15  Ward, Layla Maw, DO    Physical Exam: Vitals:   08/15/18 1610 08/15/18  0400 08/15/18 0430 08/15/18 0500  BP: (!) 157/121 (!) 140/112 (!) 172/127 (!) 149/87  Pulse: (!) 102 84 (!) 101 (!) 44  Resp: 18 (!) 23 16 (!) 25  SpO2: 100% (!) 89% 99% 100%  Weight:      Height:        Constitutional: NAD, calm, comfortable Eyes: PERRL, lids and conjunctivae normal ENMT: Mucous membranes are moist. Posterior pharynx clear of any exudate or lesions. Neck: normal, supple, no masses, no thyromegaly Respiratory: Decreased breath sounds with rales in both bases, no wheezing, no crackles. Normal respiratory effort. No accessory muscle use.  Cardiovascular: Irregularly irregular, no murmurs / rubs / gallops.  Stage III lymphedema.  2+ lower extremities pitting edema. 2+ pedal pulses. No carotid bruits.  Abdomen: Morbidly obese, soft, no tenderness, no masses palpated. No hepatosplenomegaly. Bowel sounds positive.  Musculoskeletal: no clubbing / cyanosis. Good ROM, no contractures. Normal muscle tone.  Skin: Hyperkeratosis and scaling of ankles and mid lower pretibial area.  Neurologic: CN 2-12 grossly intact. Sensation intact, DTR normal. Strength 5/5 in all 4.  Psychiatric: Normal judgment and insight. Alert and oriented x 3. Normal mood.   Labs on Admission: I have  personally reviewed following labs and imaging studies  CBC: Recent Labs  Lab 08/15/18 0100  WBC 4.2  NEUTROABS 2.9  HGB 12.3*  HCT 40.8  MCV 90.1  PLT 184   Basic Metabolic Panel: Recent Labs  Lab 08/15/18 0100  NA 137  K 3.3*  CL 105  CO2 26  GLUCOSE 89  BUN 14  CREATININE 1.14  CALCIUM 8.5*   GFR: Estimated Creatinine Clearance: 127.1 mL/min (by C-G formula based on SCr of 1.14 mg/dL). Liver Function Tests: No results for input(s): AST, ALT, ALKPHOS, BILITOT, PROT, ALBUMIN in the last 168 hours. No results for input(s): LIPASE, AMYLASE in the last 168 hours. No results for input(s): AMMONIA in the last 168 hours. Coagulation Profile: No results for input(s): INR, PROTIME in the last 168 hours. Cardiac Enzymes: Recent Labs  Lab 08/15/18 0100  TROPONINI <0.03   BNP (last 3 results) No results for input(s): PROBNP in the last 8760 hours. HbA1C: No results for input(s): HGBA1C in the last 72 hours. CBG: No results for input(s): GLUCAP in the last 168 hours. Lipid Profile: No results for input(s): CHOL, HDL, LDLCALC, TRIG, CHOLHDL, LDLDIRECT in the last 72 hours. Thyroid Function Tests: No results for input(s): TSH, T4TOTAL, FREET4, T3FREE, THYROIDAB in the last 72 hours. Anemia Panel: No results for input(s): VITAMINB12, FOLATE, FERRITIN, TIBC, IRON, RETICCTPCT in the last 72 hours. Urine analysis:    Component Value Date/Time   COLORURINE YELLOW 04/01/2018 0332   APPEARANCEUR CLEAR 04/01/2018 0332   LABSPEC 1.008 04/01/2018 0332   PHURINE 7.0 04/01/2018 0332   GLUCOSEU NEGATIVE 04/01/2018 0332   GLUCOSEU NEG mg/dL 02/02/4817 5631   HGBUR NEGATIVE 04/01/2018 0332   BILIRUBINUR NEGATIVE 04/01/2018 0332   KETONESUR NEGATIVE 04/01/2018 0332   PROTEINUR NEGATIVE 04/01/2018 0332   UROBILINOGEN 0.2 08/08/2014 1445   NITRITE NEGATIVE 04/01/2018 0332   LEUKOCYTESUR NEGATIVE 04/01/2018 0332    Radiological Exams on Admission: Dg Chest 2 View  Result  Date: 08/15/2018 CLINICAL DATA:  53 y/o M; shortness of breath, history of heart failure. EXAM: CHEST - 2 VIEW COMPARISON:  08/03/2018 chest radiograph FINDINGS: Stable cardiomegaly given projection and technique. Diffuse reticular and hazy basilar opacities of the lungs. Possible small right effusion. No pneumothorax. No acute osseous abnormality is evident. IMPRESSION: Cardiomegaly, interstitial  pulmonary edema, and mild alveolar edema. Possible small right effusion. Electronically Signed   By: Mitzi Hansen M.D.   On: 08/15/2018 02:01   Echo 04/01/2018 ------------------------------------------------------------------- LV EF: 55%  ------------------------------------------------------------------- Indications: CHF - 428.0.  ------------------------------------------------------------------- History: PMH: Acquired from the patient and from the patient&'s chart. Atrial fibrillation. PMH: NSVT (nonsustained ventricular tachycardia) Chronic anticoagulation-Xarelto. Nonischemic cardiomyopathy OSA (obstructive sleep apnea), Morbid Obesity. Risk factors: Hypertension. Dyslipidemia.  ------------------------------------------------------------------- Study Conclusions  - Left ventricle: The cavity size was normal. Wall thickness was increased in a pattern of moderate LVH. The estimated ejection fraction was 55%. Wall motion was normal; there were no regional wall motion abnormalities. The study was not technically sufficient to allow evaluation of LV diastolic dysfunction due to atrial fibrillation. - Aortic valve: Mildly calcified annulus. Trileaflet. - Mitral valve: Mildly thickened leaflets. There was trivial regurgitation. - Left atrium: The atrium was moderately to severely dilated. - Right atrium: Central venous pressure (est): 3 mm Hg. - Tricuspid valve: There was trivial regurgitation. - Pulmonary arteries: Systolic pressure could not be  accurately estimated. - Pericardium, extracardiac: A trivial pericardial effusion was identified.   EKG: Independently reviewed. Vent. rate 94 BPM PR interval * ms QRS duration 91 ms QT/QTc 377/472 ms P-R-T axes * 57 142 Atrial fibrillation Nonspecific T abnormalities, lateral leads No significant changes since previous EKG 9 days ago.  Assessment/Plan Principal Problem:   Acute on chronic combined systolic and diastolic congestive heart failure (HCC) Observation/telemetry. Continue supplemental oxygen. Continue furosemide 80 mg IVP twice a day. Supplement potassium. Restart lisinopril 5 mg p.o. daily. Continue spironolactone 100 mg p.o. daily. Resume beta-blocker at a lower dose later today. Monitor daily weights, intake and output. Given multiple hospitalizations, non-compliance with treatment and comorbidities his  Long term prognosis is poor.  Active Problems:   Hyperlipidemia Continue atorvastatin if tolerated. Monitor LFTs as needed.    Essential hypertension Continue carvedilol 25 mg p.o. twice daily. Restart lisinopril 5 mg p.o. daily. Continue spironolactone 100 mg p.o. daily. Monitor BP, HR, BUN, creatinine and electrolytes.    Asthma Supplemental oxygen as needed. Continue Qvar 1 inhalation daily. Albuterol neb every 4 hours as needed.    GERD (gastroesophageal reflux disease) Protonix 40 mg p.o. daily.    Hypokalemia Replacing. Magnesium level ordered. Magnesium supplemented with mag sulfate. Follow-up potassium level.    Atrial fibrillation, chronic CHA?DS?-VASc Score of at least 3. Continue Xarelto. Continue carvedilol 25 mg p.o. twice daily.    Normocytic anemia  Monitor H&H.    OSA (obstructive sleep apnea) Not on CPAP.    Hypocalcemia Likely due to hypoalbuminemia. Check hepatic function panel.    Morbid obesity I re-emphasized the need for significant life style modifications.    DVT prophylaxis: On Xarelto. Code  Status: Full code. Family Communication: Disposition Plan: Observation for acute combined CHF treatment. Consults called:  Admission status: Observation/telemetry.   Bobette Mo MD [facility]  08/15/2018, 5:41 AM

## 2018-08-15 NOTE — ED Notes (Signed)
ED Provider at bedside. 

## 2018-08-15 NOTE — ED Notes (Signed)
Hospitalist at bedside 

## 2018-08-15 NOTE — ED Triage Notes (Signed)
Pt arrived to er for sob, has hx of chf,

## 2018-08-15 NOTE — ED Notes (Signed)
Pt states he was at home, stood up from chair after watching TV to do some dishes and became extremely SOB. Pt reports being on a fluid restriction of <2L/day but believes he has gained 7lbs in fluid retention over the past few days.

## 2018-08-15 NOTE — Care Management Obs Status (Signed)
MEDICARE OBSERVATION STATUS NOTIFICATION   Patient Details  Name: [name] MRN: [id number] Date of Birth: [birth year]   Medicare Observation Status Notification Given:  Yes    Corey Harold 08/15/2018, 3:44 PM

## 2018-08-15 NOTE — Progress Notes (Signed)
Patient seen and examined.  Admitted after midnight secondary to acute on chronic heart failure exacerbation.  Patient well-known to practice secondary to medication noncompliance, diet indiscretion and frequent admissions due to heart failure exacerbation.  Hemodynamically stable, denies chest pain, no palpitations.  Please refer to H&P written by Dr. Robb Matar for further info/details on admission.  Plan: -Continue IV diuresis -Follow daily weights, low-sodium diet and strict intake and output -Education regarding low-sodium diet and importance about medication compliance provided.  Vassie Loll MD [phone]

## 2018-08-15 NOTE — ED Provider Notes (Signed)
[facility] EMERGENCY DEPARTMENT Provider Note   CSN: [id number] Arrival date & time: 08/15/18  0036  Time seen 1:42 AM  History   Chief Complaint Chief Complaint  Patient presents with  . Shortness of Breath    HPI [name] is a 53 y.o. male.     HPI patient has a history of congestive heart failure and atrial fibrillation who has had frequent ED visits for the same.  He reports he started getting short of breath 2 days ago.  He denies coughing but states he has been wheezing.  He feels like he has swelling of his legs and abdomen.  He complains that when he takes his medications he feels like that makes him swell more about an hour later, he then urinates an hour later after that.  He states he is on oxygen at home 3.5 L/min nasal cannula.  He states he is taking his medications.  Patient was just admitted in January for same.  PCP Pearson Grippe, MD   Past Medical History:  Diagnosis Date  . Allergic rhinitis   . Asthma   . Atrial fibrillation (HCC)   . CHF (congestive heart failure) (HCC)   . Essential hypertension   . Gastroesophageal reflux disease   . Heart attack (HCC)   . History of cardiac catheterization    Normal coronaries December 2016  . Noncompliance    Major problem leading to declining health and recurrent hospitalization  . Nonischemic cardiomyopathy (HCC)    LVEF 20-25%  . On home O2    3L N/C  . OSA (obstructive sleep apnea)   . Osteoarthritis   . Peptic ulcer disease     Patient Active Problem List   Diagnosis Date Noted  . Acute on chronic combined systolic and diastolic congestive heart failure (HCC) 08/15/2018  . SOB (shortness of breath) 08/03/2018  . Volume overload 07/22/2018  . Acute systolic CHF (congestive heart failure) (HCC) 07/04/2018  . Acute on chronic combined systolic and diastolic heart failure (HCC) 04/01/2018  . Hypomagnesemia 04/01/2018  . Acute on chronic diastolic CHF (congestive heart failure) (HCC) 03/11/2018  .  Pressure injury of skin 03/11/2018  . CHF (congestive heart failure) (HCC) 09/08/2017  . Altered mental status 05/11/2017  . Acute exacerbation of CHF (congestive heart failure) (HCC) 04/20/2017  . Acute on chronic systolic (congestive) heart failure (HCC) 04/19/2017  . CHF exacerbation (HCC) 04/11/2017  . Chronic respiratory failure with hypoxia (HCC) 04/11/2017  . Encounter for hospice care discussion   . Palliative care encounter   . Goals of care, counseling/discussion   . DNR (do not resuscitate)   . DNR (do not resuscitate) discussion   . Chronic congestive heart failure (HCC) 02/18/2017  . Acute systolic heart failure (HCC) 01/18/2017  . Pedal edema 12/02/2016  . Acute CHF (congestive heart failure) (HCC) 11/09/2016  . Acute on chronic systolic CHF (congestive heart failure) (HCC) 08/19/2016  . CKD (chronic kidney disease), stage II 05/17/2016  . Coronary arteries, normal Dec 2016 01/23/2016  . Chronic anticoagulation-Xarelto 01/23/2016  . NSVT (nonsustained ventricular tachycardia) (HCC) 01/23/2016  . Elevated troponin 12/21/2015  . Nonischemic cardiomyopathy (HCC)   . Morbid obesity due to excess calories (HCC)   . Noncompliance with diet and medication regimen 12/02/2015  . OSA (obstructive sleep apnea) 09/20/2015  . Pulmonary hypertension (HCC) 09/19/2015  . Normocytic anemia 09/19/2015  . Atrial fibrillation, chronic   . Dyspnea on exertion 05/28/2015  . Acute respiratory failure with hypoxia (HCC) 04/01/2015  .  Hypokalemia 04/01/2015  . Obesity hypoventilation syndrome (HCC) 08/08/2014  . GERD (gastroesophageal reflux disease) 08/08/2014  . Edema of right lower extremity 06/21/2014  . Fecal urgency 06/21/2014  . Asthma 02/11/2009  . Hyperlipidemia 07/12/2008  . OBESITY, MORBID 07/12/2008  . Anxiety state 07/12/2008  . DEPRESSION 07/12/2008  . Essential hypertension 07/12/2008  . ALLERGIC RHINITIS 07/12/2008  . Peptic ulcer 07/12/2008  . OSTEOARTHRITIS  07/12/2008    Past Surgical History:  Procedure Laterality Date  . CARDIAC CATHETERIZATION N/A 06/14/2015   Procedure: Right/Left Heart Cath and Coronary Angiography;  Surgeon: Runell Gess, MD;  Location: [facility] INVASIVE CV LAB;  Service: Cardiovascular;  Laterality: N/A;        Home Medications    Prior to Admission medications   Medication Sig Start Date End Date Taking? Authorizing Provider  albuterol (PROVENTIL) (2.5 MG/3ML) 0.083% nebulizer solution Take 2.5 mg by nebulization every 6 (six) hours as needed for wheezing or shortness of breath.    [provider]  aspirin 81 MG chewable tablet Chew 1 tablet (81 mg total) by mouth daily. 04/13/17   Erick Blinks, MD  atorvastatin (LIPITOR) 40 MG tablet Take 1 tablet (40 mg total) by mouth daily at 6 PM. 06/25/16   Kathlen Mody, MD  beclomethasone (QVAR) 40 MCG/ACT inhaler Inhale 2 puffs into the lungs daily as needed (for shortness of breath).    [provider]  bumetanide (BUMEX) 2 MG tablet Take 2 mg by mouth 3 (three) times daily.    [provider]  carvedilol (COREG) 25 MG tablet Take 1.5 tablets (37.5 mg total) by mouth 2 (two) times daily with a meal. Patient taking differently: Take 37.5 mg by mouth 2 (two) times daily with a meal. Patient reports he is only taking 1 tablet twice daily- 07/01/17 10/07/17   [name], MD  docusate sodium (COLACE) 100 MG capsule Take 1 capsule (100 mg total) by mouth 2 (two) times daily. 06/25/16   Kathlen Mody, MD  ferrous gluconate (FERGON) 324 MG tablet Take 1 tablet (324 mg total) by mouth 2 (two) times daily with a meal. 06/25/16   Kathlen Mody, MD  fluticasone (FLOVENT HFA) 220 MCG/ACT inhaler Inhale 2 puffs into the lungs 2 (two) times daily.    [provider]  OXYGEN Inhale 3.5 L into the lungs daily.    [provider]  pantoprazole (PROTONIX) 40 MG tablet Take 1 tablet (40 mg total) by mouth daily at 6 (six) AM. Patient taking  differently: Take 40 mg by mouth every evening.  06/26/16   Kathlen Mody, MD  potassium chloride SA (K-DUR,KLOR-CON) 20 MEQ tablet Take 1 tablet (20 mEq total) by mouth 2 (two) times daily. 06/13/18   Sherryll Burger, Pratik D, DO  rivaroxaban (XARELTO) 20 MG TABS tablet Take 1 tablet (20 mg total) by mouth daily with breakfast. Patient taking differently: Take 20 mg by mouth daily with supper.  06/26/16   Kathlen Mody, MD  spironolactone (ALDACTONE) 100 MG tablet Take 100 mg by mouth daily. For fluid 03/18/17   [provider]  tamsulosin (FLOMAX) 0.4 MG CAPS capsule Take 1 capsule (0.4 mg total) by mouth daily. 06/25/16   Kathlen Mody, MD  potassium chloride 20 MEQ TBCR Take 20 mEq by mouth 2 (two) times daily. 09/03/15 09/03/15  Ward, Layla Maw, DO    Family History Family History  Problem Relation Age of Onset  . Stroke Father   . Heart attack Father   . Aneurysm Mother  Cerebral aneurysm  . Hypertension Sister   . Colon cancer Neg Hx   . Inflammatory bowel disease Neg Hx   . Liver disease Neg Hx     Social History Social History   Tobacco Use  . Smoking status: Former Smoker    Packs/day: 0.50    Years: 20.00    Pack years: 10.00    Types: Cigarettes    Start date: 04/26/1988    Last attempt to quit: 06/23/2007    Years since quitting: 11.1  . Smokeless tobacco: Never Used  . Tobacco comment: 1 ppd former smoker  Substance Use Topics  . Alcohol use: No    Alcohol/week: 0.0 standard drinks    Comment: No etoh since 2009  . Drug use: No  On home oxygen Lives with mother and children   Allergies   Banana; Bee venom; Food; Aspirin; Metolazone; Oatmeal; Orange juice [orange oil]; Torsemide; Diltiazem; Hydralazine; and Lipitor [atorvastatin]   Review of Systems Review of Systems  All other systems reviewed and are negative.    Physical Exam Updated Vital Signs BP (!) 149/87   Pulse (!) 44   Resp (!) 25   Ht 6' (1.829 m)   Wt (!) 173.3 kg   SpO2 100%   BMI  51.81 kg/m   Physical Exam Vitals signs and nursing note reviewed.  Constitutional:      Appearance: He is well-developed. He is obese.  HENT:     Head: Normocephalic and atraumatic.     Right Ear: External ear normal.     Left Ear: External ear normal.     Nose: Nose normal.     Mouth/Throat:     Mouth: Mucous membranes are moist.     Pharynx: No oropharyngeal exudate or posterior oropharyngeal erythema.  Eyes:     Extraocular Movements: Extraocular movements intact.     Conjunctiva/sclera: Conjunctivae normal.     Pupils: Pupils are equal, round, and reactive to light.  Neck:     Musculoskeletal: Normal range of motion.  Cardiovascular:     Rate and Rhythm: Normal rate. Rhythm irregular.  Pulmonary:     Effort: No tachypnea, accessory muscle usage, prolonged expiration or respiratory distress.     Breath sounds: Decreased air movement present. Decreased breath sounds present. No wheezing, rhonchi or rales.  Abdominal:     General: There is distension.     Tenderness: There is no guarding or rebound.     Comments: Abdomen feels tight to palpation  Musculoskeletal:        General: Swelling present.     Comments: Patient has chronic thickening of his skin of his lower extremities with edema of his dorsum of his feet and his lower extremities.  Skin:    General: Skin is warm and dry.  Neurological:     General: No focal deficit present.     Mental Status: He is alert and oriented to person, place, and time.     Cranial Nerves: No cranial nerve deficit.  Psychiatric:        Mood and Affect: Mood normal.        Behavior: Behavior normal.        Thought Content: Thought content normal.      ED Treatments / Results  Labs (all labs ordered are listed, but only abnormal results are displayed) Results for orders placed or performed during the hospital encounter of 08/15/18  CBC with Differential  Result Value Ref Range   WBC 4.2 4.0 -  10.5 K/uL   RBC 4.53 4.22 - 5.81  MIL/uL   Hemoglobin 12.3 (L) 13.0 - 17.0 g/dL   HCT 73.4 03.7 - 09.6 %   MCV 90.1 80.0 - 100.0 fL   MCH 27.2 26.0 - 34.0 pg   MCHC 30.1 30.0 - 36.0 g/dL   RDW 43.8 38.1 - 84.0 %   Platelets 184 150 - 400 K/uL   nRBC 0.0 0.0 - 0.2 %   Neutrophils Relative % 68 %   Neutro Abs 2.9 1.7 - 7.7 K/uL   Lymphocytes Relative 20 %   Lymphs Abs 0.8 0.7 - 4.0 K/uL   Monocytes Relative 9 %   Monocytes Absolute 0.4 0.1 - 1.0 K/uL   Eosinophils Relative 3 %   Eosinophils Absolute 0.1 0.0 - 0.5 K/uL   Basophils Relative 0 %   Basophils Absolute 0.0 0.0 - 0.1 K/uL   Immature Granulocytes 0 %   Abs Immature Granulocytes 0.01 0.00 - 0.07 K/uL  Basic metabolic panel  Result Value Ref Range   Sodium 137 135 - 145 mmol/L   Potassium 3.3 (L) 3.5 - 5.1 mmol/L   Chloride 105 98 - 111 mmol/L   CO2 26 22 - 32 mmol/L   Glucose, Bld 89 70 - 99 mg/dL   BUN 14 6 - 20 mg/dL   Creatinine, Ser 3.75 0.61 - 1.24 mg/dL   Calcium 8.5 (L) 8.9 - 10.3 mg/dL   GFR calc non Af Amer >60 >60 mL/min   GFR calc Af Amer >60 >60 mL/min   Anion gap 6 5 - 15  Brain natriuretic peptide  Result Value Ref Range   B Natriuretic Peptide 287.0 (H) 0.0 - 100.0 pg/mL  Troponin I - ONCE - STAT  Result Value Ref Range   Troponin I <0.03 <0.03 ng/mL   Laboratory interpretation all normal except mild hypokalemia, stable elevation of BNP    EKG EKG Interpretation  Date/Time:  Monday August 15 2018 00:47:47 EST Ventricular Rate:  94 PR Interval:    QRS Duration: 91 QT Interval:  377 QTC Calculation: 472 R Axis:   57 Text Interpretation:  Atrial fibrillation Nonspecific T abnormalities, lateral leads No significant change since last tracing 05 Aug 2018 Confirmed by Devoria Albe ([id number]) on 08/15/2018 1:19:43 AM   Radiology Dg Chest 2 View  Result Date: 08/15/2018 CLINICAL DATA:  53 y/o M; shortness of breath, history of heart failure. EXAM: CHEST - 2 VIEW COMPARISON:  08/03/2018 chest radiograph FINDINGS: Stable  cardiomegaly given projection and technique. Diffuse reticular and hazy basilar opacities of the lungs. Possible small right effusion. No pneumothorax. No acute osseous abnormality is evident. IMPRESSION: Cardiomegaly, interstitial pulmonary edema, and mild alveolar edema. Possible small right effusion. Electronically Signed   By: Mitzi Hansen M.D.   On: 08/15/2018 02:01    Procedures Procedures (including critical care time)  Medications Ordered in ED Medications  potassium chloride SA (K-DUR,KLOR-CON) CR tablet 40 mEq (40 mEq Oral Given 08/15/18 0159)  furosemide (LASIX) injection 60 mg (60 mg Intravenous Given 08/15/18 0159)  nitroGLYCERIN (NITROGLYN) 2 % ointment 1 inch (1 inch Topical Given 08/15/18 0159)  albuterol (PROVENTIL) (2.5 MG/3ML) 0.083% nebulizer solution 5 mg (5 mg Nebulization Given 08/15/18 0207)     Initial Impression / Assessment and Plan / ED Course  I have reviewed the triage vital signs and the nursing notes.  Pertinent labs & imaging results that were available during my care of the patient were reviewed by me and considered in  my medical decision making (see chart for details).        Patient was given IV Lasix, he also had nitroglycerin paste applied for his mild hypertension.  He was given an albuterol nebulizer which he states usually helps.  From the way the patient describes how he feels after he takes his medications I seriously doubt if he has been compliant with his medications.  Recheck 3:55 AM patient is resting comfortably.  He states he has had urinary output.  Nurse reports he is put out 1900 cc of clear urine.  Patient states he feels like he needs to be admitted to get more fluid removed.  I will talk to the hospitalist.  5:21 AM Dr. Robb Matar, hospitalist will admit.  Final Clinical Impressions(s) / ED Diagnoses   Final diagnoses:  Acute on chronic congestive heart failure, unspecified heart failure type [facility])  Anasarca    Plan  admission  Devoria Albe, MD, Concha Pyo, MD 08/15/18 [phone]

## 2018-08-16 ENCOUNTER — Ambulatory Visit: Payer: Self-pay

## 2018-08-16 DIAGNOSIS — I10 Essential (primary) hypertension: Secondary | ICD-10-CM

## 2018-08-16 DIAGNOSIS — G4733 Obstructive sleep apnea (adult) (pediatric): Secondary | ICD-10-CM

## 2018-08-16 DIAGNOSIS — E7849 Other hyperlipidemia: Secondary | ICD-10-CM

## 2018-08-16 DIAGNOSIS — K219 Gastro-esophageal reflux disease without esophagitis: Secondary | ICD-10-CM

## 2018-08-16 LAB — BASIC METABOLIC PANEL
Anion gap: 8 (ref 5–15)
BUN: 14 mg/dL (ref 6–20)
CO2: 29 mmol/L (ref 22–32)
CREATININE: 1.1 mg/dL (ref 0.61–1.24)
Calcium: 8.7 mg/dL — ABNORMAL LOW (ref 8.9–10.3)
Chloride: 105 mmol/L (ref 98–111)
GFR calc non Af Amer: >60 mL/min (ref >60)
Glucose, Bld: 101 mg/dL — ABNORMAL HIGH (ref 70–99)
Potassium: 3.9 mmol/L (ref 3.5–5.1)
SODIUM: 142 mmol/L (ref 135–145)

## 2018-08-16 MED ORDER — ATORVASTATIN CALCIUM 20 MG PO TABS
20.0000 mg | ORAL_TABLET | Freq: Every day | ORAL | Status: DC
  Administered 2018-08-16 – 2018-08-17 (×2): 20 mg via ORAL
  Filled 2018-08-16 (×2): qty 1

## 2018-08-16 NOTE — Care Management (Signed)
Pt admitted with CHF. Pt from home, ind with ADL's. Pt's mother is main support system. She has historically prepared his meals. CM very knowledgeable about pt's history. Pt has recently been accepted onto H services by [facility] and due to non-compliance and pt not being respectful of [facility] staff will be discharged from services. Pt active with [facility]. Pt will DC home with continuation fo [facility] services. Pt has been referred to Para medicine program in the past and was DC'd from their services also due to non-compliance and for not being respectful to staff. MD plans to change diuretic regimen at DC and add lasix back into plan. Patient has had a long history of refusing lasix OP because he feels "it makes his legs swell up". Per chart review pt was last prescripbed home lasix in 2018. MD confident pt will be compliant after providing eduction.  CM will cont to follow peripherally.

## 2018-08-16 NOTE — Progress Notes (Signed)
PROGRESS NOTE    [name]  YME:[id number] DOB: 12-Jun-1966 DOA: 08/15/2018 PCP: Pearson Grippe, MD   Brief Narrative:  53 yo male who came to the ED due to progressively worsening dyspnea for the past few days. Complains of orthopnea, lower extremity edema, fatigue and 9 pounds weight gain. Patient is known to medical practice due to medication noncompliance, diet indiscretion and frequent admissions due to heart failure exacerbation. Denies chest pain and palpitations.  Assessment & Plan:  Acute on chronic combined systolic and diastolic congestive heart failure (HCC) -Continue monitoring on telemetry -Continue IV diuresis -Continue lisinopril, beta-blocker and spironolactone  -Continue monitoring daily weights, intake and output. -Still with signs of fluid overload -Patient will benefit of transition treatment to oral regimen hopefully tomorrow using equivalent dose of Lasix while he is receiving IV currently.  Then reassessment on day oral Lasix for good urine output and volume control prior to being discharged.  Hyperlipidemia -Continue atorvastatin  -Monitor LFTs as needed.  Essential hypertension -Continue carvedilol, lisinopril, spironolactone and lasix -BP stable and well controlled  Asthma -Stable and well-controlled; no requiring oxygen supplementation. -Continue the use of budesonide and albuterol as needed  GERD (gastroesophageal reflux disease) -Continue protonix  Hypokalemia -Potassium levels at 3.9 at this time -Continue following potassium level. -Replete as needed in the setting of acute diuresis.  Atrial fibrillation, chronic -Continue Xarelto. -Continue to blocker for rate control  Normocytic anemia  -Continue monitoring H&H. -No signs of acute bleeding.  OSA (obstructive sleep apnea) -Patient currently not on CPAP.  Morbid obesity Body mass index is 51.28 kg/m. Low calorie diet, portion control and increase physical activity discussed with  patient.  DVT prophylaxis: on Xarelto Code Status: FULL Family Communication: Significant other at bedside Disposition Plan: Remains inpatient, continue IV diuresis, follow urine output, daily weights, strict I's and O's and provide further adjustment to his regimen to achieve heart failure stabilization.  Consultants:   None  Procedures:   None  Antimicrobials:  Anti-infectives (From admission, onward)   None      Subjective: Patient states feeling better and with decreased swelling on his lower extremities. Denies chest pain, palpitations or major SOB.  Still reporting some days and exertion and orthopnea.  Objective: Vitals:   08/15/18 1303 08/15/18 2037 08/15/18 2135 08/16/18 0554  BP: (!) 145/98  124/71 123/84  Pulse: 87  88 79  Resp: 18   (!) 21  Temp: 98 F (36.7 C)  99 F (37.2 C) 98.9 F (37.2 C)  TempSrc: Oral  Oral Oral  SpO2: 100% 99% 100% 96%  Weight:    (!) 171.5 kg  Height:        Intake/Output Summary (Last 24 hours) at 08/16/2018 1444 Last data filed at 08/16/2018 1258 Gross per 24 hour  Intake 680 ml  Output 7750 ml  Net -7070 ml   Filed Weights   08/15/18 0955 08/15/18 1003 08/16/18 0554  Weight: (!) 176.2 kg (!) 174 kg (!) 171.5 kg    Examination: General exam: Alert, awake, oriented x 3, no signs of fluid overload on exam.  No chest pain. Respiratory system: Fine bibasilar crackles present, no wheezing. Respiratory effort normal. Cardiovascular system: rate controlled. No murmurs, rubs, gallops. Gastrointestinal system: obese abdomen, soft and nontender. No organomegaly or masses felt. Normal bowel sounds heard. Central nervous system: Alert and oriented. No focal neurological deficits. Extremities: No cyanosis, no clubbing. 2++ edema bilaterally.  Skin: Hyperkeratosis and scaling of ankles and mid lower pretibial area. Psychiatry:  Judgement and insight appear normal. Mood & affect appropriate.   Data Reviewed: I have personally  reviewed following labs and imaging studies  CBC: Recent Labs  Lab 08/15/18 0100  WBC 4.2  NEUTROABS 2.9  HGB 12.3*  HCT 40.8  MCV 90.1  PLT 184   Basic Metabolic Panel: Recent Labs  Lab 08/15/18 0100 08/16/18 0539  NA 137 142  K 3.3* 3.9  CL 105 105  CO2 26 29  GLUCOSE 89 101*  BUN 14 14  CREATININE 1.14 1.10  CALCIUM 8.5* 8.7*  MG 2.1  --   PHOS 4.4  --    GFR: Estimated Creatinine Clearance: 130.9 mL/min (by C-G formula based on SCr of 1.1 mg/dL).   Liver Function Tests: Recent Labs  Lab 08/15/18 0100  AST 18  ALT 15  ALKPHOS 65  BILITOT 1.1  PROT 6.9  ALBUMIN 3.8   Cardiac Enzymes: Recent Labs  Lab 08/15/18 0100  TROPONINI <0.03   Urine analysis:    Component Value Date/Time   COLORURINE YELLOW 04/01/2018 0332   APPEARANCEUR CLEAR 04/01/2018 0332   LABSPEC 1.008 04/01/2018 0332   PHURINE 7.0 04/01/2018 0332   GLUCOSEU NEGATIVE 04/01/2018 0332   GLUCOSEU NEG mg/dL 43/83/8184 0375   HGBUR NEGATIVE 04/01/2018 0332   BILIRUBINUR NEGATIVE 04/01/2018 0332   KETONESUR NEGATIVE 04/01/2018 0332   PROTEINUR NEGATIVE 04/01/2018 0332   UROBILINOGEN 0.2 08/08/2014 1445   NITRITE NEGATIVE 04/01/2018 0332   LEUKOCYTESUR NEGATIVE 04/01/2018 0332    Radiology Studies: Dg Chest 2 View  Result Date: 08/15/2018 CLINICAL DATA:  53 y/o M; shortness of breath, history of heart failure. EXAM: CHEST - 2 VIEW COMPARISON:  08/03/2018 chest radiograph FINDINGS: Stable cardiomegaly given projection and technique. Diffuse reticular and hazy basilar opacities of the lungs. Possible small right effusion. No pneumothorax. No acute osseous abnormality is evident. IMPRESSION: Cardiomegaly, interstitial pulmonary edema, and mild alveolar edema. Possible small right effusion. Electronically Signed   By: Mitzi Hansen M.D.   On: 08/15/2018 02:01   Scheduled Meds: . carvedilol  25 mg Oral BID WC  . furosemide  80 mg Intravenous BID  . lisinopril  2.5 mg Oral Daily    . pantoprazole  40 mg Oral QPM  . potassium chloride  40 mEq Oral BID  . rivaroxaban  20 mg Oral Q supper  . spironolactone  100 mg Oral Daily   Continuous Infusions:   LOS: 0 days    Time spent: 30 minutes   Vassie Loll, MD [facility] Pager [phone]   08/16/2018, 2:44 PM

## 2018-08-17 DIAGNOSIS — Z6841 Body Mass Index (BMI) 40.0 and over, adult: Secondary | ICD-10-CM | POA: Diagnosis not present

## 2018-08-17 DIAGNOSIS — Z9981 Dependence on supplemental oxygen: Secondary | ICD-10-CM | POA: Diagnosis not present

## 2018-08-17 DIAGNOSIS — E876 Hypokalemia: Secondary | ICD-10-CM | POA: Diagnosis present

## 2018-08-17 DIAGNOSIS — I110 Hypertensive heart disease with heart failure: Secondary | ICD-10-CM | POA: Diagnosis present

## 2018-08-17 DIAGNOSIS — Z8249 Family history of ischemic heart disease and other diseases of the circulatory system: Secondary | ICD-10-CM | POA: Diagnosis not present

## 2018-08-17 DIAGNOSIS — E785 Hyperlipidemia, unspecified: Secondary | ICD-10-CM | POA: Diagnosis present

## 2018-08-17 DIAGNOSIS — I5043 Acute on chronic combined systolic (congestive) and diastolic (congestive) heart failure: Secondary | ICD-10-CM | POA: Diagnosis not present

## 2018-08-17 DIAGNOSIS — Z823 Family history of stroke: Secondary | ICD-10-CM | POA: Diagnosis not present

## 2018-08-17 DIAGNOSIS — I482 Chronic atrial fibrillation, unspecified: Secondary | ICD-10-CM | POA: Diagnosis present

## 2018-08-17 DIAGNOSIS — I2510 Atherosclerotic heart disease of native coronary artery without angina pectoris: Secondary | ICD-10-CM | POA: Diagnosis present

## 2018-08-17 DIAGNOSIS — D649 Anemia, unspecified: Secondary | ICD-10-CM | POA: Diagnosis present

## 2018-08-17 DIAGNOSIS — Z7982 Long term (current) use of aspirin: Secondary | ICD-10-CM | POA: Diagnosis not present

## 2018-08-17 DIAGNOSIS — R601 Generalized edema: Secondary | ICD-10-CM | POA: Diagnosis present

## 2018-08-17 DIAGNOSIS — J45909 Unspecified asthma, uncomplicated: Secondary | ICD-10-CM | POA: Diagnosis present

## 2018-08-17 DIAGNOSIS — K219 Gastro-esophageal reflux disease without esophagitis: Secondary | ICD-10-CM | POA: Diagnosis present

## 2018-08-17 DIAGNOSIS — Z87891 Personal history of nicotine dependence: Secondary | ICD-10-CM | POA: Diagnosis not present

## 2018-08-17 DIAGNOSIS — Z7901 Long term (current) use of anticoagulants: Secondary | ICD-10-CM | POA: Diagnosis not present

## 2018-08-17 DIAGNOSIS — G4733 Obstructive sleep apnea (adult) (pediatric): Secondary | ICD-10-CM | POA: Diagnosis present

## 2018-08-17 DIAGNOSIS — I252 Old myocardial infarction: Secondary | ICD-10-CM | POA: Diagnosis not present

## 2018-08-17 LAB — BASIC METABOLIC PANEL
Anion gap: 8 (ref 5–15)
BUN: 16 mg/dL (ref 6–20)
CO2: 27 mmol/L (ref 22–32)
CREATININE: 1.17 mg/dL (ref 0.61–1.24)
Calcium: 8.6 mg/dL — ABNORMAL LOW (ref 8.9–10.3)
Chloride: 105 mmol/L (ref 98–111)
GFR calc Af Amer: >60 mL/min (ref >60)
Glucose, Bld: 117 mg/dL — ABNORMAL HIGH (ref 70–99)
Potassium: 3.6 mmol/L (ref 3.5–5.1)
Sodium: 140 mmol/L (ref 135–145)

## 2018-08-17 NOTE — Progress Notes (Signed)
PROGRESS NOTE    [name]  BSJ:[id number] DOB: 1966/03/15 DOA: 08/15/2018 PCP: Pearson Grippe, MD   Brief Narrative:  Per HPI: 53 yo male who came to the ED due to progressively worsening dyspnea for the past few days. Complains of orthopnea, lower extremity edema, fatigue and 9 pounds weight gain. Patient is known to medical practice due to medication noncompliance, diet indiscretion and frequent admissions due to heart failure exacerbation. Denies chest pain and palpitations.  He has been admitted with acute on chronic combined systolic and diastolic CHF.  Assessment & Plan:   Principal Problem:   Acute on chronic combined systolic and diastolic congestive heart failure (HCC) Active Problems:   Hyperlipidemia   Essential hypertension   Asthma   GERD (gastroesophageal reflux disease)   Hypokalemia   Atrial fibrillation, chronic   Normocytic anemia   OSA (obstructive sleep apnea)  Acute on chronic combined systolic and diastolic congestive heart failure (HCC) -Continue monitoring on telemetry -Continue IV diuresis -Continue lisinopril, beta-blocker and spironolactone  -Continue monitoring daily weights, intake and output. -Still with signs of fluid overload -Patient will benefit of transition treatment to oral regimen hopefully tomorrow using equivalent dose of Lasix while he is receiving IV currently.  Then reassessment on day oral Lasix for good urine output and volume control prior to being discharged.  Hyperlipidemia -Continue atorvastatin  -Monitor LFTs as needed.  Essential hypertension -Continue carvedilol, lisinopril, spironolactone and lasix -BP stable and well controlled  Asthma -Stable and well-controlled; no requiring oxygen supplementation. -Continue the use of budesonide and albuterol as needed  GERD (gastroesophageal reflux disease) -Continue protonix  Hypokalemia -Potassium levels at 3.9 at this time -Continue following potassium  level. -Replete as needed in the setting of acute diuresis.  Atrial fibrillation, chronic -Continue Xarelto. -Continue to blocker for rate control  Normocytic anemia  -Continue monitoring H&H. -No signs of acute bleeding.  OSA (obstructive sleep apnea) -Patient currently not on CPAP.  Morbid obesity Body mass index is 51.28 kg/m. Low calorie diet, portion control and increase physical activity discussed with patient.  DVT prophylaxis: on Xarelto Code Status: FULL Family Communication:  None at bedside. Disposition Plan: Remains inpatient, continue IV diuresis, follow urine output, daily weights, strict I's and O's and provide further adjustment to his regimen to achieve heart failure stabilization.  We will plan for discharge in the next 24 to 48 hours discussed with cardiology need for heart failure management in the outpatient setting.  Consultants:   None  Procedures:   None  Antimicrobials:   None   Subjective: Patient seen and evaluated today with no new acute complaints or concerns. No acute concerns or events noted overnight.  He is overall starting to feel better with less swelling to his lower extremities, but still continues to have some.  He denies any significant shortness of breath or chest pain.  Objective: Vitals:   08/16/18 2040 08/17/18 0500 08/17/18 0633 08/17/18 0736  BP:   120/86 (!) 147/98  Pulse:   75 88  Resp:   16 20  Temp:   98.3 F (36.8 C) 98.2 F (36.8 C)  TempSrc:   Oral Oral  SpO2: 92%  95% 100%  Weight:  (!) 168.3 kg    Height:        Intake/Output Summary (Last 24 hours) at 08/17/2018 1222 Last data filed at 08/17/2018 1018 Gross per 24 hour  Intake 1320 ml  Output 3200 ml  Net -1880 ml   [employer]  08/15/18 1003 08/16/18 0554 08/17/18 0500  Weight: (!) 174 kg (!) 171.5 kg (!) 168.3 kg    Examination:  General exam: Appears calm and comfortable  Respiratory system: Clear to auscultation. Respiratory effort  normal. Cardiovascular system: S1 & S2 heard, RRR. No JVD, murmurs, rubs, gallops or clicks. No pedal edema. Gastrointestinal system: Abdomen is nondistended, soft and nontender. No organomegaly or masses felt. Normal bowel sounds heard. Central nervous system: Alert and oriented. No focal neurological deficits. Extremities: Symmetric 5 x 5 power. Skin: No rashes, lesions or ulcers Psychiatry: Judgement and insight appear normal. Mood & affect appropriate.     Data Reviewed: I have personally reviewed following labs and imaging studies  CBC: Recent Labs  Lab 08/15/18 0100  WBC 4.2  NEUTROABS 2.9  HGB 12.3*  HCT 40.8  MCV 90.1  PLT 184   Basic Metabolic Panel: Recent Labs  Lab 08/15/18 0100 08/16/18 0539 08/17/18 0513  NA 137 142 140  K 3.3* 3.9 3.6  CL 105 105 105  CO2 26 29 27   GLUCOSE 89 101* 117*  BUN 14 14 16   CREATININE 1.14 1.10 1.17  CALCIUM 8.5* 8.7* 8.6*  MG 2.1  --   --   PHOS 4.4  --   --    GFR: Estimated Creatinine Clearance: 121.7 mL/min (by C-G formula based on SCr of 1.17 mg/dL). Liver Function Tests: Recent Labs  Lab 08/15/18 0100  AST 18  ALT 15  ALKPHOS 65  BILITOT 1.1  PROT 6.9  ALBUMIN 3.8   No results for input(s): LIPASE, AMYLASE in the last 168 hours. No results for input(s): AMMONIA in the last 168 hours. Coagulation Profile: No results for input(s): INR, PROTIME in the last 168 hours. Cardiac Enzymes: Recent Labs  Lab 08/15/18 0100  TROPONINI <0.03   BNP (last 3 results) No results for input(s): PROBNP in the last 8760 hours. HbA1C: No results for input(s): HGBA1C in the last 72 hours. CBG: No results for input(s): GLUCAP in the last 168 hours. Lipid Profile: No results for input(s): CHOL, HDL, LDLCALC, TRIG, CHOLHDL, LDLDIRECT in the last 72 hours. Thyroid Function Tests: No results for input(s): TSH, T4TOTAL, FREET4, T3FREE, THYROIDAB in the last 72 hours. Anemia Panel: No results for input(s): VITAMINB12, FOLATE,  FERRITIN, TIBC, IRON, RETICCTPCT in the last 72 hours. Sepsis Labs: No results for input(s): PROCALCITON, LATICACIDVEN in the last 168 hours.  No results found for this or any previous visit (from the past 240 hour(s)).       Radiology Studies: No results found.      Scheduled Meds: . atorvastatin  20 mg Oral q1800  . carvedilol  25 mg Oral BID WC  . furosemide  80 mg Intravenous BID  . lisinopril  2.5 mg Oral Daily  . pantoprazole  40 mg Oral QPM  . potassium chloride  40 mEq Oral BID  . rivaroxaban  20 mg Oral Q supper  . spironolactone  100 mg Oral Daily   Continuous Infusions:   LOS: 0 days    Time spent: 30 minutes    Samuel Buckwalter Hoover Brunette, DO [facility] Pager [phone]  If 7PM-7AM, please contact night-coverage [email or web address] Password [facility] 08/17/2018, 12:22 PM

## 2018-08-18 LAB — BASIC METABOLIC PANEL
Anion gap: 7 (ref 5–15)
BUN: 18 mg/dL (ref 6–20)
CO2: 27 mmol/L (ref 22–32)
Calcium: 8.7 mg/dL — ABNORMAL LOW (ref 8.9–10.3)
Chloride: 107 mmol/L (ref 98–111)
Creatinine, Ser: 1.27 mg/dL — ABNORMAL HIGH (ref 0.61–1.24)
GFR calc Af Amer: >60 mL/min (ref >60)
GFR calc non Af Amer: >60 mL/min (ref >60)
Glucose, Bld: 96 mg/dL (ref 70–99)
Potassium: 4 mmol/L (ref 3.5–5.1)
Sodium: 141 mmol/L (ref 135–145)

## 2018-08-18 LAB — MAGNESIUM: MAGNESIUM: 2 mg/dL (ref 1.7–2.4)

## 2018-08-18 MED ORDER — CARVEDILOL 25 MG PO TABS: 25.0000 mg | ORAL_TABLET | Freq: Two times a day (BID) | ORAL | 2 refills | Status: AC

## 2018-08-18 MED ORDER — LISINOPRIL 2.5 MG PO TABS: 2.5000 mg | ORAL_TABLET | Freq: Every day | ORAL | 0 refills | Status: DC

## 2018-08-18 MED ORDER — ATORVASTATIN CALCIUM 20 MG PO TABS: 20.0000 mg | ORAL_TABLET | Freq: Every day | ORAL | 2 refills | Status: DC

## 2018-08-18 NOTE — Discharge Summary (Signed)
Physician Discharge Summary  [name] EBR:[id number] DOB: Oct 02, 1965 DOA: 08/15/2018  PCP: Pearson Grippe, MD  Admit date: 08/15/2018  Discharge date: 08/18/2018  Admitted From:Home  Disposition:  Home  Recommendations for Outpatient Follow-up:  1. Follow up with PCP in 1-2 weeks 2. Please follow-up with Dr. Purvis Sheffield as scheduled for tomorrow 3. Continue medications as prescribed  Home Health: None  Equipment/Devices: None  Discharge Condition: Stable  CODE STATUS: Full  Diet recommendation: Heart Healthy  Brief/Interim Summary: Per HPI: 53 yo male who came to the ED due to progressively worsening dyspnea for the past few days. Complains of orthopnea, lower extremity edema, fatigue and 9 pounds weight gain. Patient is known to medical practice due to medication noncompliance, diet indiscretion and frequent admissions due to heart failure exacerbation. Denies chest pain and palpitations.  He has been admitted with acute on chronic combined systolic and diastolic CHF.  He has a net negative fluid balance of close to 6 L during this admission and his weight has come down from 171.5 kg to 165.2 kg.  His lower extremity edema, shortness of breath and fatigue have improved.  He appears to be somewhat compliant with his medications and claims that he takes them on a daily basis, but the timing may be off.  He has not been very compliant with follow-up appointments as well and has been encouraged to follow-up with Dr. Purvis Sheffield tomorrow to ensure that his medications are adjusted properly.  No other acute events have taken place during this brief admission.  Discharge Diagnoses:  Principal Problem:   Acute on chronic combined systolic and diastolic congestive heart failure (HCC) Active Problems:   Hyperlipidemia   Essential hypertension   Asthma   GERD (gastroesophageal reflux disease)   Hypokalemia   Atrial fibrillation, chronic   Normocytic anemia   OSA (obstructive sleep  apnea)  Principal discharge diagnosis: Acute on chronic combined systolic and diastolic CHF decompensation.  Discharge Instructions  Discharge Instructions    Diet - low sodium heart healthy   Complete by:  As directed    Increase activity slowly   Complete by:  As directed      Allergies as of 08/18/2018      Reactions   Banana Shortness Of Breath   Bee Venom Shortness Of Breath, Swelling, Other (See Comments)   Reaction:  Facial swelling   Food Shortness Of Breath, Rash, Other (See Comments)   Pt states that he is allergic to strawberries.     Aspirin Other (See Comments)   Reaction:  GI upset    Metolazone Other (See Comments)   Pt states that he stopped taking this med due to heart attack like symptoms.    Oatmeal Nausea And Vomiting   Orange Juice [orange Oil] Nausea And Vomiting   All acidic products make him nauseous and upset stomach   Torsemide Swelling, Other (See Comments)   Reaction:  Swelling of feet/legs    Diltiazem Palpitations   Hydralazine Palpitations   Lipitor [atorvastatin] Other (See Comments)   Reaction:  Nose bleeds       Medication List    TAKE these medications   albuterol (2.5 MG/3ML) 0.083% nebulizer solution Commonly known as:  PROVENTIL Take 2.5 mg by nebulization every 6 (six) hours as needed for wheezing or shortness of breath.   aspirin 81 MG chewable tablet Chew 1 tablet (81 mg total) by mouth daily.   atorvastatin 20 MG tablet Commonly known as:  LIPITOR Take 1 tablet (20  mg total) by mouth daily at 6 PM for 30 days. What changed:    medication strength  how much to take   beclomethasone 40 MCG/ACT inhaler Commonly known as:  QVAR Inhale 2 puffs into the lungs daily as needed (for shortness of breath).   bumetanide 2 MG tablet Commonly known as:  BUMEX Take 2 mg by mouth 3 (three) times daily.   carvedilol 25 MG tablet Commonly known as:  COREG Take 1 tablet (25 mg total) by mouth 2 (two) times daily with a meal for 30  days. What changed:  how much to take   docusate sodium 100 MG capsule Commonly known as:  COLACE Take 1 capsule (100 mg total) by mouth 2 (two) times daily.   ferrous gluconate 324 MG tablet Commonly known as:  FERGON Take 1 tablet (324 mg total) by mouth 2 (two) times daily with a meal.   fluticasone 220 MCG/ACT inhaler Commonly known as:  FLOVENT HFA Inhale 2 puffs into the lungs 2 (two) times daily as needed (for shortness of breath).   lisinopril 2.5 MG tablet Commonly known as:  PRINIVIL,ZESTRIL Take 1 tablet (2.5 mg total) by mouth daily for 30 days. Start taking on:  August 19, 2018   OXYGEN Inhale 3.5 L into the lungs daily.   pantoprazole 40 MG tablet Commonly known as:  PROTONIX Take 1 tablet (40 mg total) by mouth daily at 6 (six) AM. What changed:  when to take this   potassium chloride SA 20 MEQ tablet Commonly known as:  K-DUR,KLOR-CON Take 1 tablet (20 mEq total) by mouth 2 (two) times daily.   rivaroxaban 20 MG Tabs tablet Commonly known as:  XARELTO Take 1 tablet (20 mg total) by mouth daily with breakfast. What changed:  when to take this   spironolactone 100 MG tablet Commonly known as:  ALDACTONE Take 100 mg by mouth daily. For fluid   tamsulosin 0.4 MG Caps capsule Commonly known as:  FLOMAX Take 1 capsule (0.4 mg total) by mouth daily.      Follow-up Information    Pearson Grippe, MD Follow up in 1 week(s).   Specialty:  Internal Medicine Contact information: [address] [place] [address] [phone]        [name], MD Follow up in 1 day(s).   Specialty:  Cardiology Why:  Please follow up with your appointment as scheduled tomorrow. Contact information: [address] [place] [address] [phone]          Allergies  Allergen Reactions  . Banana Shortness Of Breath  . Bee Venom Shortness Of Breath, Swelling and Other (See Comments)    Reaction:  Facial swelling  . Food Shortness Of  Breath, Rash and Other (See Comments)    Pt states that he is allergic to strawberries.    . Aspirin Other (See Comments)    Reaction:  GI upset   . Metolazone Other (See Comments)    Pt states that he stopped taking this med due to heart attack like symptoms.   . Oatmeal Nausea And Vomiting  . Orange Juice [Orange Oil] Nausea And Vomiting    All acidic products make him nauseous and upset stomach  . Torsemide Swelling and Other (See Comments)    Reaction:  Swelling of feet/legs   . Diltiazem Palpitations  . Hydralazine Palpitations  . Lipitor [Atorvastatin] Other (See Comments)    Reaction:  Nose bleeds     Consultations:  None   Procedures/Studies: Dg Chest 2 View  Result Date: 08/15/2018 CLINICAL DATA:  53 y/o M; shortness of breath, history of heart failure. EXAM: CHEST - 2 VIEW COMPARISON:  08/03/2018 chest radiograph FINDINGS: Stable cardiomegaly given projection and technique. Diffuse reticular and hazy basilar opacities of the lungs. Possible small right effusion. No pneumothorax. No acute osseous abnormality is evident. IMPRESSION: Cardiomegaly, interstitial pulmonary edema, and mild alveolar edema. Possible small right effusion. Electronically Signed   By: Mitzi Hansen M.D.   On: 08/15/2018 02:01   Dg Chest 2 View  Result Date: 07/22/2018 CLINICAL DATA:  Shortness of breath for 2 days. EXAM: CHEST - 2 VIEW COMPARISON:  PA and lateral chest 07/21/2018 and 05/11/2017. Single-view of the chest 07/02/2018 and 09/03/2017 FINDINGS: Cardiomegaly and chronic vascular congestion are again seen. No consolidative process, pneumothorax or effusion. No acute or focal bony abnormality. IMPRESSION: Cardiomegaly and pulmonary vascular congestion. Electronically Signed   By: Drusilla Kanner M.D.   On: 07/22/2018 16:49   Dg Chest Portable 1 View  Result Date: 08/03/2018 CLINICAL DATA:  53 year old male with a history of leg edema shortness of breath EXAM: PORTABLE CHEST 1 VIEW  COMPARISON:  07/25/2018, 07/22/2018, 07/21/2018 FINDINGS: Cardiomediastinal silhouette unchanged with cardiomegaly. Fullness in the central vasculature with interlobular septal thickening. No pneumothorax. No large pleural effusion. No displaced fracture. IMPRESSION: Cardiomegaly with evidence of mild pulmonary edema. Atypical infection not excluded. Electronically Signed   By: Gilmer Mor D.O.   On: 08/03/2018 19:03   Dg Chest Port 1 View  Result Date: 07/25/2018 CLINICAL DATA:  Acute systolic CHF EXAM: PORTABLE CHEST 1 VIEW COMPARISON:  07/22/2018 FINDINGS: Chronic cardiomegaly and vascular pedicle widening. Vascular congestion without Kerley lines or effusion. No pneumothorax IMPRESSION: Cardiomegaly and vascular congestion. Electronically Signed   By: Marnee Spring M.D.   On: 07/25/2018 05:42     Discharge Exam: Vitals:   08/17/18 2328 08/18/18 0512  BP: 116/90 (!) 129/57  Pulse: 82 70  Resp: 20 20  Temp: 98.3 F (36.8 C) 98 F (36.7 C)  SpO2: 98% 98%   Vitals:   08/17/18 0736 08/17/18 1342 08/17/18 2328 08/18/18 0512  BP: (!) 147/98 117/79 116/90 (!) 129/57  Pulse: 88 69 82 70  Resp: 20 (!) 22 20 20   Temp: 98.2 F (36.8 C) 98.5 F (36.9 C) 98.3 F (36.8 C) 98 F (36.7 C)  TempSrc: Oral Oral Oral Oral  SpO2: 100% 98% 98% 98%  Weight:    (!) 165.2 kg  Height:        General: Pt is alert, awake, not in acute distress Cardiovascular: RRR, S1/S2 +, no rubs, no gallops Respiratory: CTA bilaterally, no wheezing, no rhonchi Abdominal: Soft, NT, ND, bowel sounds + Extremities: no edema, no cyanosis    The results of significant diagnostics from this hospitalization (including imaging, microbiology, ancillary and laboratory) are listed below for reference.     Microbiology: No results found for this or any previous visit (from the past 240 hour(s)).   Labs: BNP (last 3 results) Recent Labs    07/22/18 1646 08/03/18 1749 08/15/18 0100  BNP 166.0* 346.0* 287.0*    Basic Metabolic Panel: Recent Labs  Lab 08/15/18 0100 08/16/18 0539 08/17/18 0513 08/18/18 0655  NA 137 142 140 141  K 3.3* 3.9 3.6 4.0  CL 105 105 105 107  CO2 26 29 27 27   GLUCOSE 89 101* 117* 96  BUN 14 14 16 18   CREATININE 1.14 1.10 1.17 1.27*  CALCIUM  8.5* 8.7* 8.6* 8.7*  MG 2.1  --   --  2.0  PHOS 4.4  --   --   --    Liver Function Tests: Recent Labs  Lab 08/15/18 0100  AST 18  ALT 15  ALKPHOS 65  BILITOT 1.1  PROT 6.9  ALBUMIN 3.8   No results for input(s): LIPASE, AMYLASE in the last 168 hours. No results for input(s): AMMONIA in the last 168 hours. CBC: Recent Labs  Lab 08/15/18 0100  WBC 4.2  NEUTROABS 2.9  HGB 12.3*  HCT 40.8  MCV 90.1  PLT 184   Cardiac Enzymes: Recent Labs  Lab 08/15/18 0100  TROPONINI <0.03   BNP: Invalid input(s): POCBNP CBG: No results for input(s): GLUCAP in the last 168 hours. D-Dimer No results for input(s): DDIMER in the last 72 hours. Hgb A1c No results for input(s): HGBA1C in the last 72 hours. Lipid Profile No results for input(s): CHOL, HDL, LDLCALC, TRIG, CHOLHDL, LDLDIRECT in the last 72 hours. Thyroid function studies No results for input(s): TSH, T4TOTAL, T3FREE, THYROIDAB in the last 72 hours.  Invalid input(s): FREET3 Anemia work up No results for input(s): VITAMINB12, FOLATE, FERRITIN, TIBC, IRON, RETICCTPCT in the last 72 hours. Urinalysis    Component Value Date/Time   COLORURINE YELLOW 04/01/2018 0332   APPEARANCEUR CLEAR 04/01/2018 0332   LABSPEC 1.008 04/01/2018 0332   PHURINE 7.0 04/01/2018 0332   GLUCOSEU NEGATIVE 04/01/2018 0332   GLUCOSEU NEG mg/dL 52/84/1324 4010   HGBUR NEGATIVE 04/01/2018 0332   BILIRUBINUR NEGATIVE 04/01/2018 0332   KETONESUR NEGATIVE 04/01/2018 0332   PROTEINUR NEGATIVE 04/01/2018 0332   UROBILINOGEN 0.2 08/08/2014 1445   NITRITE NEGATIVE 04/01/2018 0332   LEUKOCYTESUR NEGATIVE 04/01/2018 0332   Sepsis Labs Invalid input(s): PROCALCITONIN,  WBC,   LACTICIDVEN Microbiology No results found for this or any previous visit (from the past 240 hour(s)).   Time coordinating discharge: 35 minutes  SIGNED:   Erick Blinks, DO [facility] 08/18/2018, 10:45 AM  If 7PM-7AM, please contact night-coverage [email or web address] Password [email or web address]

## 2018-08-18 NOTE — Progress Notes (Signed)
Discharge instructions reviewed with patient. Patient verbalized understanding of instructions. Patient discharged home in stable condition.  

## 2018-08-19 ENCOUNTER — Encounter: Payer: Self-pay | Admitting: Cardiovascular Disease

## 2018-08-19 ENCOUNTER — Other Ambulatory Visit: Payer: Self-pay

## 2018-08-19 ENCOUNTER — Ambulatory Visit (INDEPENDENT_AMBULATORY_CARE_PROVIDER_SITE_OTHER): Payer: Medicare Other | Admitting: Cardiovascular Disease

## 2018-08-19 ENCOUNTER — Ambulatory Visit: Payer: Medicare Other | Admitting: Cardiovascular Disease

## 2018-08-19 VITALS — BP 128/86 | HR 72 | Ht 72.0 in | Wt 371.0 lb

## 2018-08-19 DIAGNOSIS — Z9289 Personal history of other medical treatment: Secondary | ICD-10-CM

## 2018-08-19 DIAGNOSIS — Z91148 Patient's other noncompliance with medication regimen for other reason: Secondary | ICD-10-CM

## 2018-08-19 DIAGNOSIS — I10 Essential (primary) hypertension: Secondary | ICD-10-CM | POA: Diagnosis not present

## 2018-08-19 DIAGNOSIS — I4821 Permanent atrial fibrillation: Secondary | ICD-10-CM | POA: Diagnosis not present

## 2018-08-19 DIAGNOSIS — Z9111 Patient's noncompliance with dietary regimen: Secondary | ICD-10-CM

## 2018-08-19 DIAGNOSIS — Z91119 Patient's noncompliance with dietary regimen due to unspecified reason: Secondary | ICD-10-CM

## 2018-08-19 DIAGNOSIS — Z9114 Patient's other noncompliance with medication regimen: Secondary | ICD-10-CM

## 2018-08-19 DIAGNOSIS — I5032 Chronic diastolic (congestive) heart failure: Secondary | ICD-10-CM | POA: Diagnosis not present

## 2018-08-19 DIAGNOSIS — I428 Other cardiomyopathies: Secondary | ICD-10-CM

## 2018-08-19 NOTE — Patient Instructions (Signed)
Medication Instructions: Your physician recommends that you continue on your current medications as directed. Please refer to the Current Medication list given to you today.   Labwork: None today  Procedures/Testing: None today  Follow-Up: 3 months with Physician Assistant  Any Additional Special Instructions Will Be Listed Below (If Applicable).     If you need a refill on your cardiac medications before your next appointment, please call your pharmacy.

## 2018-08-19 NOTE — Progress Notes (Signed)
SUBJECTIVE: The patient presents for posthospitalization follow-up.  He was discharged yesterday for acute on chronic diastolic heart failure.  He lost roughly 6 L of fluid with a discharge weight of 165.2 kg. He was discharged on Bumex 2 mg 3 times daily and spironolactone 100 mg.  He was hospitalized for the same earlier this month.  Echocardiogram on 04/01/2018 demonstrated normalization of left ventricular systolic function, LVEF 55%, with moderate LVH.  There was moderate to severe left atrial dilatation.  He is here with his [facility], [name], RN.  She plans to go out to his house in the near future to assess medication compliance and dietary regimen.  We also talked about the importance of I/O balance and daily weights.  He does eat foods prepared with sodium.  He does not have much of an appetite but said his breathing has improved.  Chronic lower extremity edema is stable.  Creatinine 1.27 on 08/18/2018.   Review of Systems: As per "subjective", otherwise negative.  Allergies  Allergen Reactions  . Banana Shortness Of Breath  . Bee Venom Shortness Of Breath, Swelling and Other (See Comments)    Reaction:  Facial swelling  . Food Shortness Of Breath, Rash and Other (See Comments)    Pt states that he is allergic to strawberries.    . Aspirin Other (See Comments)    Reaction:  GI upset   . Metolazone Other (See Comments)    Pt states that he stopped taking this med due to heart attack like symptoms.   . Oatmeal Nausea And Vomiting  . Orange Juice [Orange Oil] Nausea And Vomiting    All acidic products make him nauseous and upset stomach  . Torsemide Swelling and Other (See Comments)    Reaction:  Swelling of feet/legs   . Diltiazem Palpitations  . Hydralazine Palpitations  . Lipitor [Atorvastatin] Other (See Comments)    Reaction:  Nose bleeds     Current Outpatient Medications  Medication Sig Dispense Refill  . albuterol (PROVENTIL)  (2.5 MG/3ML) 0.083% nebulizer solution Take 2.5 mg by nebulization every 6 (six) hours as needed for wheezing or shortness of breath.    Marland Kitchen aspirin 81 MG chewable tablet Chew 1 tablet (81 mg total) by mouth daily.    Marland Kitchen atorvastatin (LIPITOR) 20 MG tablet Take 1 tablet (20 mg total) by mouth daily at 6 PM for 30 days. 30 tablet 2  . beclomethasone (QVAR) 40 MCG/ACT inhaler Inhale 2 puffs into the lungs daily as needed (for shortness of breath).    . bumetanide (BUMEX) 2 MG tablet Take 2 mg by mouth 3 (three) times daily.    . carvedilol (COREG) 25 MG tablet Take 1 tablet (25 mg total) by mouth 2 (two) times daily with a meal for 30 days. 60 tablet 2  . docusate sodium (COLACE) 100 MG capsule Take 1 capsule (100 mg total) by mouth 2 (two) times daily. 10 capsule 0  . ferrous gluconate (FERGON) 324 MG tablet Take 1 tablet (324 mg total) by mouth 2 (two) times daily with a meal. 60 tablet 0  . fluticasone (FLOVENT HFA) 220 MCG/ACT inhaler Inhale 2 puffs into the lungs 2 (two) times daily as needed (for shortness of breath).     Marland Kitchen lisinopril (PRINIVIL,ZESTRIL) 2.5 MG tablet Take 1 tablet (2.5 mg total) by mouth daily for 30 days. 30 tablet 0  . OXYGEN Inhale 3.5 L into the lungs daily.    Marland Kitchen  pantoprazole (PROTONIX) 40 MG tablet Take 1 tablet (40 mg total) by mouth daily at 6 (six) AM. (Patient taking differently: Take 40 mg by mouth every evening. ) 30 tablet 0  . potassium chloride SA (K-DUR,KLOR-CON) 20 MEQ tablet Take 1 tablet (20 mEq total) by mouth 2 (two) times daily. 30 tablet 2  . rivaroxaban (XARELTO) 20 MG TABS tablet Take 1 tablet (20 mg total) by mouth daily with breakfast. (Patient taking differently: Take 20 mg by mouth daily with supper. ) 30 tablet 0  . spironolactone (ALDACTONE) 100 MG tablet Take 100 mg by mouth daily. For fluid  12  . tamsulosin (FLOMAX) 0.4 MG CAPS capsule Take 1 capsule (0.4 mg total) by mouth daily. 30 capsule 0   No current facility-administered medications for this  visit.     Past Medical History:  Diagnosis Date  . Allergic rhinitis   . Asthma   . Atrial fibrillation (HCC)   . CHF (congestive heart failure) (HCC)   . Essential hypertension   . Gastroesophageal reflux disease   . Heart attack (HCC)   . History of cardiac catheterization    Normal coronaries December 2016  . Noncompliance    Major problem leading to declining health and recurrent hospitalization  . Nonischemic cardiomyopathy (HCC)    LVEF 20-25%  . On home O2    3L N/C  . OSA (obstructive sleep apnea)   . Osteoarthritis   . Peptic ulcer disease     Past Surgical History:  Procedure Laterality Date  . CARDIAC CATHETERIZATION N/A 06/14/2015   Procedure: Right/Left Heart Cath and Coronary Angiography;  Surgeon: Runell Gess, MD;  Location: [facility] INVASIVE CV LAB;  Service: Cardiovascular;  Laterality: N/A;    Social History   Socioeconomic History  . Marital status: Divorced    Spouse name: Not on file  . Number of children: 3  . Years of education: Not on file  . Highest education level: Not on file  Occupational History  . Occupation: disabled  Social Needs  . Financial resource strain: Patient refused  . Food insecurity:    Worry: Patient refused    Inability: Patient refused  . Transportation needs:    Medical: Patient refused    Non-medical: Patient refused  Tobacco Use  . Smoking status: Former Smoker    Packs/day: 0.50    Years: 20.00    Pack years: 10.00    Types: Cigarettes    Start date: 04/26/1988    Last attempt to quit: 06/23/2007    Years since quitting: 11.1  . Smokeless tobacco: Never Used  . Tobacco comment: 1 ppd former smoker  Substance and Sexual Activity  . Alcohol use: No    Alcohol/week: 0.0 standard drinks    Comment: No etoh since 2009  . Drug use: No  . Sexual activity: Never  Lifestyle  . Physical activity:    Days per week: Patient refused    Minutes per session: Patient refused  . Stress: Patient refused  Relationships   . Social connections:    Talks on phone: Patient refused    Gets together: Patient refused    Attends religious service: Patient refused    Active member of club or organization: Patient refused    Attends meetings of clubs or organizations: Patient refused    Relationship status: Patient refused  . Intimate partner violence:    Fear of current or ex partner: Patient refused    Emotionally abused: Patient refused  Physically abused: Patient refused    Forced sexual activity: Patient refused  Other Topics Concern  . Not on file  Social History Narrative  . Not on file     Vitals:   08/19/18 1039  BP: 128/86  Pulse: 72  SpO2: 96%  Weight: (!) 371 lb (168.3 kg)  Height: 6' (1.829 m)    Wt Readings from Last 3 Encounters:  08/19/18 (!) 371 lb (168.3 kg)  08/18/18 (!) 364 lb 3.2 oz (165.2 kg)  08/05/18 (!) 366 lb 4.8 oz (166.2 kg)     PHYSICAL EXAM General: NAD HEENT: Wearing sunglasses. Neck: No JVD, no thyromegaly. Lungs: Clear to auscultation bilaterally with normal respiratory effort. CV: Regular rate and irregular rhythm, normal S1/S2, no S3, no murmur. Chronic bilateral lower extremity edema with lipodermatosclerosis.  Abdomen: Soft, nontender, obese.  Neurologic: Alert and oriented.  Psych: Normal affect. Skin: Bilateral lower extremity lipodermatosclerosis. Musculoskeletal: No gross deformities.    ECG: Reviewed above under Subjective   Labs: Lab Results  Component Value Date/Time   K 4.0 08/18/2018 06:55 AM   BUN 18 08/18/2018 06:55 AM   CREATININE 1.27 (H) 08/18/2018 06:55 AM   CREATININE 1.02 07/07/2011 09:09 AM   ALT 15 08/15/2018 01:00 AM   TSH 2.954 12/02/2016 08:55 PM   TSH 0.322 (L) 04/02/2014 10:27 AM   HGB 12.3 (L) 08/15/2018 01:00 AM     Lipids: Lab Results  Component Value Date/Time   LDLCALC 99 06/14/2015 03:28 AM   CHOL 139 06/14/2015 03:28 AM   TRIG 58 06/14/2015 03:28 AM   HDL 28 (L) 06/14/2015 03:28 AM       ASSESSMENT  AND PLAN:  1.  Chronic diastolic heart failure: He was discharged yesterday.  He takes Bumex 2 mg 3 times daily and spironolactone 100 mg daily.  Blood pressure is normal.  Continue carvedilol at present dose. He is here with his [facility], [name], RN. She plans to go out to his house in the near future to assess medication compliance and dietary regimen.  We also talked about the importance of I/O balance and daily weights.  He does eat foods prepared with sodium.  2.  Nonischemic cardiomyopathy: Left ventricular systolic function is normal by most recent echocardiogram in October 2019, LVEF 55%.  Normal coronaries in December 2016.  3.  Permanent atrial fibrillation: Heart rate is controlled on carvedilol.  Anticoagulated with Xarelto.  I will discontinue aspirin.  4.  Hypertension: Blood pressure is normal.  No changes to therapy.   Disposition: Follow up 3 months   Prentice Docker, M.D., F.A.C.C.

## 2018-08-22 ENCOUNTER — Ambulatory Visit: Payer: Self-pay

## 2018-08-22 ENCOUNTER — Other Ambulatory Visit: Payer: Self-pay

## 2018-08-22 NOTE — Patient Outreach (Signed)
[facility]) Care Management  [facility]   08/22/2018  [name] April 16, 1966 [id number]  Reason for referral: Medication Reconciliation Post Discharge  PMHx includes but not limited to:  Hypertension, combined systolic and diastolic heart failure, atrial fibrillation, GERD, CKD Stage II and hyperlipidemia.  Outreach:  Successful telephone call with [name].  HIPAA identifiers verified.   Subjective:  [name] states that he is "doing fine."  He reports that he is compliant with his medications.  He reports that he manages his medications himself.  He states that he has been weighing himself, but when asked what his weights were over the weekend, he states that the paper with his recorded weights is in the other room and he did not want to go get it.   Objective: Lab Results  Component Value Date   CREATININE 1.27 (H) 08/18/2018   CREATININE 1.17 08/17/2018   CREATININE 1.10 08/16/2018    Lab Results  Component Value Date   HGBA1C 5.7 (H) 04/04/2016    Lipid Panel     Component Value Date/Time   CHOL 139 06/14/2015 0328   TRIG 58 06/14/2015 0328   HDL 28 (L) 06/14/2015 0328   CHOLHDL 5.0 06/14/2015 0328   VLDL 12 06/14/2015 0328   LDLCALC 99 06/14/2015 0328    BP Readings from Last 3 Encounters:  08/19/18 128/86  08/18/18 (!) 129/57  08/05/18 127/87    Allergies  Allergen Reactions  . Banana Shortness Of Breath  . Bee Venom Shortness Of Breath, Swelling and Other (See Comments)    Reaction:  Facial swelling  . Food Shortness Of Breath, Rash and Other (See Comments)    Pt states that he is allergic to strawberries.    . Aspirin Other (See Comments)    Reaction:  GI upset   . Metolazone Other (See Comments)    Pt states that he stopped taking this med due to heart attack like symptoms.   . Oatmeal Nausea And Vomiting  . Orange Juice [Orange Oil] Nausea And Vomiting    All acidic products make him nauseous and upset stomach  .  Torsemide Swelling and Other (See Comments)    Reaction:  Swelling of feet/legs   . Diltiazem Palpitations  . Hydralazine Palpitations  . Lipitor [Atorvastatin] Other (See Comments)    Reaction:  Nose bleeds     Medications Reviewed Today    Reviewed by Hurley Cisco, [facility] (Pharmacist) on 08/22/18 at 0912  Med List Status: <None>  Medication Order Taking? Sig Documenting Provider Last Dose Status Informant  albuterol (PROVENTIL) (2.5 MG/3ML) 0.083% nebulizer solution [id number] Yes Take 2.5 mg by nebulization every 6 (six) hours as needed for wheezing or shortness of breath. [provider] Taking Active Multiple Informants           Med Note Thomasene Lot Mar 04, 2017  8:53 PM)         Patient not taking:       Discontinued 08/22/18 0909 (Discontinued by provider)            Med Note (Lowana Hable, [name] D   Fri Jul 01, 2018 12:27 PM)    atorvastatin (LIPITOR) 20 MG tablet [id number] Yes Take 1 tablet (20 mg total) by mouth daily at 6 PM for 30 days. Sherryll Burger, Pratik D, DO Taking Active   beclomethasone (QVAR) 40 MCG/ACT inhaler [id number] Yes Inhale 2 puffs into the lungs daily as needed (for shortness of breath).  [provider] Taking Active Multiple Informants           Med Note Allyne Gee, Kristen Loader   Wed Aug 03, 2018  5:52 PM) Patient reported-pharmacy records do not support this  bumetanide (BUMEX) 2 MG tablet [id number] Yes Take 2 mg by mouth 3 (three) times daily. [provider] Taking Active Multiple Informants  carvedilol (COREG) 25 MG tablet [id number] Yes Take 1 tablet (25 mg total) by mouth 2 (two) times daily with a meal for 30 days. Sherryll Burger, Pratik D, DO Taking Active   docusate sodium (COLACE) 100 MG capsule [id number] Yes Take 1 capsule (100 mg total) by mouth 2 (two) times daily. Kathlen Mody, MD Taking Active Multiple Informants  ferrous gluconate (FERGON) 324 MG tablet [id number] Yes Take 1 tablet (324 mg total) by mouth 2 (two) times daily with a  meal. Kathlen Mody, MD Taking Active Multiple Informants  fluticasone (FLOVENT HFA) 220 MCG/ACT inhaler [id number] No Inhale 2 puffs into the lungs 2 (two) times daily as needed (for shortness of breath).  [provider] Not Taking Active Multiple Informants  lisinopril (PRINIVIL,ZESTRIL) 2.5 MG tablet [id number] Yes Take 1 tablet (2.5 mg total) by mouth daily for 30 days. Maurilio Lovely D, DO Taking Active   OXYGEN [id number] Yes Inhale 3.5 L into the lungs daily. [provider] Taking Active Multiple Informants           Med Note Landis Gandy   Fri Jul 22, 2018  5:23 PM)    pantoprazole (PROTONIX) 40 MG tablet [id number] Yes Take 1 tablet (40 mg total) by mouth daily at 6 (six) AM.  Patient taking differently:  Take 40 mg by mouth every evening.    Kathlen Mody, MD Taking Active Multiple Informants        Discontinued 09/03/15 [phone] (Entry Error)   potassium chloride SA (K-DUR,KLOR-CON) 20 MEQ tablet [id number] Yes Take 1 tablet (20 mEq total) by mouth 2 (two) times daily. Maurilio Lovely D, DO Taking Active Multiple Informants  rivaroxaban (XARELTO) 20 MG TABS tablet [id number] Yes Take 1 tablet (20 mg total) by mouth daily with breakfast.  Patient taking differently:  Take 20 mg by mouth daily with supper.    Kathlen Mody, MD Taking Active Multiple Informants           Med Note Allyne Gee, Nolon Stalls Aug 15, 2018  3:41 PM) Pharmacy records indicate that this medication has not been filled in over 90 days. Patient states that he has vials leftover from samples and he has been taking those daily. Patient is confirming taking this medication nightly at 20mg  despite no pharmacy records for the medication.  spironolactone (ALDACTONE) 100 MG tablet [id number] Yes Take 100 mg by mouth daily. For fluid [provider] Taking Active Multiple Informants  tamsulosin (FLOMAX) 0.4 MG CAPS capsule [id number] Yes Take 1 capsule (0.4 mg total) by mouth daily. Kathlen Mody, MD Taking Active  Multiple Informants  Med List Note Rolene Course 08/15/18 1541): Patient has been verified via pharmacy records ([facility], [place], [place])         ASSESSMENT: Date Discharged from Hospital: 08/18/18 Date Medication Reconciliation Performed: 08/22/2018   Patient was recently discharged from hospital and all medications have been reviewed.  Assessment:  Drugs sorted by system:  Neurologic/Psychologic:   Cardiovascular: atorvastatin, bumetanide, carvedilol, lisinopril, potassium chloride, rivaroxaban, spironolactone  Pulmonary/Allergy: albuterol neb, beclomethasone, fluticasone  Gastrointestinal: docusate sodium, ferrous gluconate, pantoprazole  Genitourinary: tamsulosin  Medication Review  Findings:  Rivaroxaban- patient reports that he IS taking 20 mg daily, despite the note on his medication list that states' "his pharmacy records indicate that has not filled this prescription in over 90 days."  Call placed to [facility] and his last fill was in April 2019 for a 90 day supply.  [name] reports that he uses this pharmacy and that he has a "good bit left," even though it was last filled almost a year ago.  He states that he "had extra."  Counseled him on the importance of taking this medication to prevent a stroke, since he has atrial fibrillation.  He states that he is aware.   Aspirin-disontinued at cardiology visit on 08/19/18.  Plan: Outreach to [name] next week to monitorcompliance.   [name], PharmD Clinical Pharmacist [facility] [phone]

## 2018-08-24 ENCOUNTER — Other Ambulatory Visit: Payer: Self-pay

## 2018-08-24 DIAGNOSIS — I5042 Chronic combined systolic (congestive) and diastolic (congestive) heart failure: Secondary | ICD-10-CM | POA: Diagnosis not present

## 2018-08-24 DIAGNOSIS — I482 Chronic atrial fibrillation, unspecified: Secondary | ICD-10-CM | POA: Diagnosis not present

## 2018-08-24 DIAGNOSIS — I10 Essential (primary) hypertension: Secondary | ICD-10-CM | POA: Diagnosis not present

## 2018-08-24 DIAGNOSIS — I110 Hypertensive heart disease with heart failure: Secondary | ICD-10-CM | POA: Diagnosis not present

## 2018-08-24 DIAGNOSIS — I429 Cardiomyopathy, unspecified: Secondary | ICD-10-CM | POA: Diagnosis not present

## 2018-08-24 DIAGNOSIS — L853 Xerosis cutis: Secondary | ICD-10-CM | POA: Diagnosis not present

## 2018-08-24 NOTE — Patient Outreach (Signed)
[facility]) Care Management  08/24/2018  SEVAK CHENNAULT May 23, 1966 [id number]  Outreach with Mr. Chapla regarding pending home visit. Reported he will be evaluated at [facility] clinic today. Will not be available for home visit as scheduled. Agreeable to visit on 08/26/18.  PLAN Will follow up on 08/26/18.   Katha Cabal [facility] Community Care Manager [phone]

## 2018-08-26 ENCOUNTER — Other Ambulatory Visit: Payer: Self-pay

## 2018-08-26 NOTE — Patient Outreach (Addendum)
[facility]) Care Management   08/26/2018  OLDEN WROE 08-Sep-1965 [id number]  LASHAD PALLANES is an 53 y.o. male  Subjective:  RNCM in for initial home visit. Member alert and oriented x 3. In no acute distress.  Objective:   Review of Systems  Constitutional: Negative.   HENT: Negative.   Eyes: Negative.   Respiratory: Positive for cough.   Cardiovascular: Positive for leg swelling.       Edema and notable scarring to both lower extremities.  Gastrointestinal: Negative.   Genitourinary: Positive for frequency.  Musculoskeletal: Negative.   Skin: Positive for itching.  Neurological: Negative for dizziness, tingling, tremors, speech change, weakness and headaches.    Physical Exam  Constitutional: He is oriented to person, place, and time. He appears well-developed.  Cardiovascular: Normal rate.  Respiratory: Effort normal.  GI: Soft. Bowel sounds are normal.  Musculoskeletal:        General: Edema present.  Neurological: He is alert and oriented to person, place, and time.  Skin: Skin is warm and dry.  Notable scarring to both lower extremities.  Psychiatric: He has a normal mood and affect. His behavior is normal. Judgment and thought content normal.    Encounter Medications:   Outpatient Encounter Medications as of 08/26/2018  Medication Sig Note  . bumetanide (BUMEX) 2 MG tablet Take 2 mg by mouth 3 (three) times daily.   . carvedilol (COREG) 25 MG tablet Take 1 tablet (25 mg total) by mouth 2 (two) times daily with a meal for 30 days.   . potassium chloride SA (K-DUR,KLOR-CON) 20 MEQ tablet Take 1 tablet (20 mEq total) by mouth 2 (two) times daily.   Marland Kitchen spironolactone (ALDACTONE) 100 MG tablet Take 100 mg by mouth daily. For fluid   . tamsulosin (FLOMAX) 0.4 MG CAPS capsule Take 1 capsule (0.4 mg total) by mouth daily.   Marland Kitchen albuterol (PROVENTIL) (2.5 MG/3ML) 0.083% nebulizer solution Take 2.5 mg by nebulization every 6 (six) hours as needed for wheezing  or shortness of breath.   Marland Kitchen atorvastatin (LIPITOR) 20 MG tablet Take 1 tablet (20 mg total) by mouth daily at 6 PM for 30 days. 08/26/2018: Patient taking 40mg  daily.  . beclomethasone (QVAR) 40 MCG/ACT inhaler Inhale 2 puffs into the lungs daily as needed (for shortness of breath). 08/03/2018: Patient reported-pharmacy records do not support this  . docusate sodium (COLACE) 100 MG capsule Take 1 capsule (100 mg total) by mouth 2 (two) times daily.   . ferrous gluconate (FERGON) 324 MG tablet Take 1 tablet (324 mg total) by mouth 2 (two) times daily with a meal.   . fluticasone (FLOVENT HFA) 220 MCG/ACT inhaler Inhale 2 puffs into the lungs 2 (two) times daily as needed (for shortness of breath).    Marland Kitchen lisinopril (PRINIVIL,ZESTRIL) 2.5 MG tablet Take 1 tablet (2.5 mg total) by mouth daily for 30 days.   . OXYGEN Inhale 3.5 L into the lungs daily.   . pantoprazole (PROTONIX) 40 MG tablet Take 1 tablet (40 mg total) by mouth daily at 6 (six) AM. (Patient taking differently: Take 40 mg by mouth every evening. )   . rivaroxaban (XARELTO) 20 MG TABS tablet Take 1 tablet (20 mg total) by mouth daily with breakfast. (Patient taking differently: Take 20 mg by mouth daily with supper. ) 08/15/2018: Pharmacy records indicate that this medication has not been filled in over 90 days. Patient states that he has vials leftover from samples and he has been taking  those daily. Patient is confirming taking this medication nightly at 20mg  despite no pharmacy records for the medication.  . [DISCONTINUED] potassium chloride 20 MEQ TBCR Take 20 mEq by mouth 2 (two) times daily.    No facility-administered encounter medications on file as of 08/26/2018.     Functional Status:   In your present state of health, do you have any difficulty performing the following activities: 08/26/2018 08/15/2018  Hearing? N N  Vision? N N  Difficulty concentrating or making decisions? N N  Walking or climbing stairs? Y N  Comment Due to heart  condition. -  Dressing or bathing? N N  Doing errands, shopping? Y N  Comment Due to heart condition. -  Preparing Food and eating ? N -  Using the Toilet? N -  In the past six months, have you accidently leaked urine? N -  Do you have problems with loss of bowel control? N -  Managing your Medications? N -  Managing your Finances? N -  Housekeeping or managing your Housekeeping? Y -  Some recent data might be hidden    Fall/Depression Screening:    Fall Risk  08/26/2018  Falls in the past year? 0  Risk for fall due to : Other (Comment)  Risk for fall due to: Comment Discussed precaution  Follow up Falls prevention discussed   PHQ 2/9 Scores 08/26/2018 06/30/2018 06/20/2018  PHQ - 2 Score 1 2 0  PHQ- 9 Score - 6 -   ASSESSMENT  Initial home visit complete. Mr. Pinuelas reported feeling well but complained of "usual tightness" in his lower extremities. Denied complaints of pain, shortness of breath or chest discomfort. Reported weighing earlier today but unable to recall reading. Reported ambulating well and denied changes in activity tolerance. Continues to perform ADLs independently. During medication review, noted that Mr. Raveling had several bottles of expired medications and less than a 10-day supply available. Reported receiving a 90-day supply from [facility] but unable to determine when medications were filled. Member admits to not taking medications properly and reported keeping bottles in multiple locations throughout the home. Agreed to obtain refills later today. Agreeable to [facility] preparing pillbox at next visit. During nutritional assessment Mr. Haubner reported that he "doesn't eat as much" and doesn't add salt to his meals but continues to consume several high sodium foods. Meals frequently consist of hot dogs, hamburgers, fast food, and canned meats. Discussed importance of increasing intake of fruits and vegetables and decreasing sodium intake.  Agreeable to further discussions regarding  healthy options and meal planning. Will assist with community outreach and food pantry applications if needed. Overall Mr. Hoiland was very receptive but had difficulties understanding how his dietary habits and medication noncompliance directly contribute to CHF complications. Stated he would "try harder" and agreed to ongoing outreach and home visits. [facility] CM Care Plan Problem One     Most Recent Value  Care Plan Problem One  Knowledge Deficit related to CHF self management.  Role Documenting the Problem One  Care Management Coordinator  Care Plan for Problem One  Active  [facility] Long Term Goal   Over the next 60 days patient will implement at least three CHF self management goals.  [facility] Long Term Goal Start Date  08/26/18  Interventions for Problem One Long Term Goal  Provided education regarding CHF zones, importance of weighing daily, taking medications as prescribed and maintaining a low sodium diet.  [facility] CM Short Term Goal #1   Patient will take  all medications as prescribed over the next 30 days.  [facility] CM Short Term Goal #1 Start Date  08/26/18  Interventions for Short Term Goal #1  Discussed importance of medications compliance. Reviewed medications and indications for use.  [Expired meds discarded. Pt to obtain refills today.]  [facility] CM Short Term Goal #2   Over the next 30 days patient will weigh daily.  [facility] CM Short Term Goal #2 Start Date  08/26/18  Interventions for Short Term Goal #2  Discussed parameters for weight. Provided instructions for weighing and recording readings. [facility]N log provided.  [facility] CM Short Term Goal #3  Over the next 30 days patient will attend all scheduled MD visits.  [facility] CM Short Term Goal #3 Start Date  08/26/18  Interventions for Short Tern Goal #3  Discussed importance of attending appointments as scheduled. Discussed transportation needs.  [facility] CM Short Term Goal #4  Over the next 30 days patient will decrease intake of high sodium foods.  [facility] CM Short Term Goal #4  Start Date  08/26/18  Interventions for Short Term Goal #4  Provided education regarding nutrition labels and diet.   [facility] CM Short Term Goal #5   Over the next 30 days patient will monitor fluid intake and output.  [facility] CM Short Term Goal #5 Start Date  08/26/18  Interventions for Short Term Goal #5  Discussed importance of monitoring/measuring intake and output. Provided education regarding volume overload.     PLAN -Will update [facility] Pharmacist regarding medication concerns and fill 30-day pillbox at next visit. -Will contact [employer] regarding CHF remote monitoring program. -Will follow up with Mr. Willmann next week.   Katha Cabal [facility] Community Care Manager [phone]

## 2018-08-29 ENCOUNTER — Ambulatory Visit: Payer: Self-pay

## 2018-08-29 ENCOUNTER — Other Ambulatory Visit: Payer: Self-pay

## 2018-08-29 NOTE — Patient Outreach (Signed)
[facility]) Care Management  08/29/2018  LIAHM TREMONTI 11/18/1965 [id number]   Spoke with [facility] RN, [name] about her home visit with Mr. Hacke last week.  She reports that he had several expired prescriptions that needed to be discarded and refills called into his pharmacy.   She had requested that Mr. Poulsen call [facility] and order refills.     Successful outreach to Mr. [name].  HIPAA identifiers verified.   Medication Management: Mr. Koby reports that his legs are "about like usual, full of fluid."  He reports that he weighted himself "early this morning" and wrote it in his log, but "can't remember" how much he weighed.   He states that his log is downstairs.  Stressed the importance of daily weighing and recording the values.  Questioned if he had called [facility] for ref[facility] as his nurse had recommended.  He states that the pharmacy usually states it it "too early" and "wont' give me any" or that "I don't have refills."  [facility] way call [facility] with [facility] Chapla.  [facility]:  Xarelto- last filled in April 2019 #90.  Refilling 08/29/18  Bumex 2mg  -last filled in 01/04/18 #90  Refilling 08/29/18  Spironolactone - last filled 10/19 Refilling 08/29/18  Potassium Chloride - most recent scipts on profile 11/19 and 1/20 never filled, but have different directions.  Requested they contact Dr. Purvis Sheffield for correct directions.   Carvedilol - can be filled on 09/03/18 Pantoprazole- refilling 08/29/18 Tamsulosin - pharmacy calling [facility] for refills Atorvastatin- last filled 08/18/18  Questioned Mr. Craker how he reports that he is taking his medications as prescribed when he should have ran out of some months ago, based on the dispensed history.  Again discussed the importance of taking his medications as prescribed.  Mr. Fattore said that he "can't take them, if they won't let me have them,"  In reference to his pharmacy.   He is able to tell me that Xarelto is to  prevent a stroke and states that he understands why this medication is important to take everyday as prescribed. When questioned, he states that he has a "few pills left" and doesn't report being out of any of his prescriptions.  Plan: Will route note to [facility] RN Felecia McCray.   [name], PharmD Clinical Pharmacist [facility] [phone]

## 2018-08-31 ENCOUNTER — Other Ambulatory Visit: Payer: Self-pay

## 2018-08-31 ENCOUNTER — Emergency Department (HOSPITAL_COMMUNITY): Payer: Medicare Other

## 2018-08-31 ENCOUNTER — Encounter (HOSPITAL_COMMUNITY): Payer: Self-pay | Admitting: Emergency Medicine

## 2018-08-31 ENCOUNTER — Inpatient Hospital Stay (HOSPITAL_COMMUNITY)
Admission: EM | Admit: 2018-08-31 | Discharge: 2018-09-02 | DRG: 291 | Disposition: A | Discharge status: Home / Self Care | Payer: Medicare Other | Attending: Internal Medicine | Admitting: Internal Medicine

## 2018-08-31 DIAGNOSIS — N182 Chronic kidney disease, stage 2 (mild): Secondary | ICD-10-CM | POA: Diagnosis not present

## 2018-08-31 DIAGNOSIS — I472 Ventricular tachycardia: Secondary | ICD-10-CM | POA: Diagnosis not present

## 2018-08-31 DIAGNOSIS — I4891 Unspecified atrial fibrillation: Secondary | ICD-10-CM | POA: Diagnosis not present

## 2018-08-31 DIAGNOSIS — Z8711 Personal history of peptic ulcer disease: Secondary | ICD-10-CM

## 2018-08-31 DIAGNOSIS — Z7901 Long term (current) use of anticoagulants: Secondary | ICD-10-CM

## 2018-08-31 DIAGNOSIS — Z6841 Body Mass Index (BMI) 40.0 and over, adult: Secondary | ICD-10-CM

## 2018-08-31 DIAGNOSIS — Z91128 Patient's intentional underdosing of medication regimen for other reason: Secondary | ICD-10-CM | POA: Diagnosis not present

## 2018-08-31 DIAGNOSIS — I10 Essential (primary) hypertension: Secondary | ICD-10-CM

## 2018-08-31 DIAGNOSIS — I428 Other cardiomyopathies: Secondary | ICD-10-CM

## 2018-08-31 DIAGNOSIS — E662 Morbid (severe) obesity with alveolar hypoventilation: Secondary | ICD-10-CM | POA: Diagnosis present

## 2018-08-31 DIAGNOSIS — I491 Atrial premature depolarization: Secondary | ICD-10-CM | POA: Diagnosis not present

## 2018-08-31 DIAGNOSIS — E785 Hyperlipidemia, unspecified: Secondary | ICD-10-CM

## 2018-08-31 DIAGNOSIS — I110 Hypertensive heart disease with heart failure: Secondary | ICD-10-CM | POA: Diagnosis not present

## 2018-08-31 DIAGNOSIS — E876 Hypokalemia: Secondary | ICD-10-CM | POA: Diagnosis not present

## 2018-08-31 DIAGNOSIS — I4821 Permanent atrial fibrillation: Secondary | ICD-10-CM | POA: Diagnosis not present

## 2018-08-31 DIAGNOSIS — J45909 Unspecified asthma, uncomplicated: Secondary | ICD-10-CM | POA: Diagnosis not present

## 2018-08-31 DIAGNOSIS — I5043 Acute on chronic combined systolic (congestive) and diastolic (congestive) heart failure: Secondary | ICD-10-CM

## 2018-08-31 DIAGNOSIS — Z9111 Patient's noncompliance with dietary regimen: Secondary | ICD-10-CM | POA: Diagnosis not present

## 2018-08-31 DIAGNOSIS — I509 Heart failure, unspecified: Secondary | ICD-10-CM | POA: Diagnosis not present

## 2018-08-31 DIAGNOSIS — Z8249 Family history of ischemic heart disease and other diseases of the circulatory system: Secondary | ICD-10-CM

## 2018-08-31 DIAGNOSIS — Z9114 Patient's other noncompliance with medication regimen: Secondary | ICD-10-CM

## 2018-08-31 DIAGNOSIS — I130 Hypertensive heart and chronic kidney disease with heart failure and stage 1 through stage 4 chronic kidney disease, or unspecified chronic kidney disease: Secondary | ICD-10-CM | POA: Diagnosis not present

## 2018-08-31 DIAGNOSIS — I272 Pulmonary hypertension, unspecified: Secondary | ICD-10-CM | POA: Diagnosis not present

## 2018-08-31 DIAGNOSIS — I493 Ventricular premature depolarization: Secondary | ICD-10-CM | POA: Diagnosis present

## 2018-08-31 DIAGNOSIS — K219 Gastro-esophageal reflux disease without esophagitis: Secondary | ICD-10-CM | POA: Diagnosis present

## 2018-08-31 DIAGNOSIS — R0689 Other abnormalities of breathing: Secondary | ICD-10-CM | POA: Diagnosis not present

## 2018-08-31 DIAGNOSIS — I890 Lymphedema, not elsewhere classified: Secondary | ICD-10-CM | POA: Diagnosis present

## 2018-08-31 DIAGNOSIS — I252 Old myocardial infarction: Secondary | ICD-10-CM | POA: Diagnosis not present

## 2018-08-31 DIAGNOSIS — J9611 Chronic respiratory failure with hypoxia: Secondary | ICD-10-CM | POA: Diagnosis not present

## 2018-08-31 DIAGNOSIS — Z9981 Dependence on supplemental oxygen: Secondary | ICD-10-CM | POA: Diagnosis not present

## 2018-08-31 DIAGNOSIS — E66813 Obesity, class 3: Secondary | ICD-10-CM | POA: Diagnosis present

## 2018-08-31 DIAGNOSIS — T501X6A Underdosing of loop [high-ceiling] diuretics, initial encounter: Secondary | ICD-10-CM | POA: Diagnosis present

## 2018-08-31 DIAGNOSIS — Z9119 Patient's noncompliance with other medical treatment and regimen: Secondary | ICD-10-CM | POA: Diagnosis not present

## 2018-08-31 DIAGNOSIS — R0602 Shortness of breath: Secondary | ICD-10-CM | POA: Diagnosis not present

## 2018-08-31 DIAGNOSIS — I5033 Acute on chronic diastolic (congestive) heart failure: Secondary | ICD-10-CM | POA: Diagnosis not present

## 2018-08-31 DIAGNOSIS — Z87891 Personal history of nicotine dependence: Secondary | ICD-10-CM

## 2018-08-31 DIAGNOSIS — I482 Chronic atrial fibrillation, unspecified: Secondary | ICD-10-CM | POA: Diagnosis not present

## 2018-08-31 DIAGNOSIS — G4733 Obstructive sleep apnea (adult) (pediatric): Secondary | ICD-10-CM | POA: Diagnosis present

## 2018-08-31 DIAGNOSIS — Z823 Family history of stroke: Secondary | ICD-10-CM

## 2018-08-31 LAB — COMPREHENSIVE METABOLIC PANEL
ALT: 15 U/L (ref 0–44)
AST: 19 U/L (ref 15–41)
Albumin: 3.5 g/dL (ref 3.5–5.0)
Alkaline Phosphatase: 61 U/L (ref 38–126)
Anion gap: 6 (ref 5–15)
BILIRUBIN TOTAL: 0.8 mg/dL (ref 0.3–1.2)
BUN: 13 mg/dL (ref 6–20)
CO2: 24 mmol/L (ref 22–32)
Calcium: 8.1 mg/dL — ABNORMAL LOW (ref 8.9–10.3)
Chloride: 109 mmol/L (ref 98–111)
Creatinine, Ser: 1 mg/dL (ref 0.61–1.24)
GFR calc non Af Amer: >60 mL/min (ref >60)
Glucose, Bld: 97 mg/dL (ref 70–99)
Potassium: 3.4 mmol/L — ABNORMAL LOW (ref 3.5–5.1)
Sodium: 139 mmol/L (ref 135–145)
Total Protein: 6.4 g/dL — ABNORMAL LOW (ref 6.5–8.1)

## 2018-08-31 LAB — CBC WITH DIFFERENTIAL/PLATELET
Abs Immature Granulocytes: 0.01 10*3/uL (ref 0.00–0.07)
BASOS ABS: 0 10*3/uL (ref 0.0–0.1)
Basophils Relative: 0 %
EOS PCT: 2 %
Eosinophils Absolute: 0.1 10*3/uL (ref 0.0–0.5)
HCT: 37.3 % — ABNORMAL LOW (ref 39.0–52.0)
Hemoglobin: 11.8 g/dL — ABNORMAL LOW (ref 13.0–17.0)
Immature Granulocytes: 0 %
Lymphocytes Relative: 21 %
Lymphs Abs: 0.9 10*3/uL (ref 0.7–4.0)
MCH: 28.2 pg (ref 26.0–34.0)
MCHC: 31.6 g/dL (ref 30.0–36.0)
MCV: 89.2 fL (ref 80.0–100.0)
Monocytes Absolute: 0.4 10*3/uL (ref 0.1–1.0)
Monocytes Relative: 10 %
Neutro Abs: 2.9 10*3/uL (ref 1.7–7.7)
Neutrophils Relative %: 67 %
Platelets: 171 10*3/uL (ref 150–400)
RBC: 4.18 MIL/uL — ABNORMAL LOW (ref 4.22–5.81)
RDW: 14 % (ref 11.5–15.5)
WBC: 4.3 10*3/uL (ref 4.0–10.5)
nRBC: 0 % (ref 0.0–0.2)

## 2018-08-31 LAB — BLOOD GAS, VENOUS
Acid-Base Excess: 1.3 mmol/L (ref 0.0–2.0)
Bicarbonate: 24.6 mmol/L (ref 20.0–28.0)
FIO2: 32
O2 Content: 100 L/min
O2 Saturation: 71.9 %
PH VEN: 7.359 (ref 7.250–7.430)
Patient temperature: 37
pCO2, Ven: 47.6 mmHg (ref 44.0–60.0)
pO2, Ven: 43.2 mmHg (ref 32.0–45.0)

## 2018-08-31 LAB — TROPONIN I: Troponin I: 0.03 ng/mL (ref <0.03)

## 2018-08-31 LAB — MRSA PCR SCREENING: MRSA by PCR: NEGATIVE

## 2018-08-31 LAB — BRAIN NATRIURETIC PEPTIDE: B Natriuretic Peptide: 341 pg/mL — ABNORMAL HIGH (ref 0.0–100.0)

## 2018-08-31 MED ORDER — POTASSIUM CHLORIDE CRYS ER 20 MEQ PO TBCR
40.0000 meq | EXTENDED_RELEASE_TABLET | Freq: Once | ORAL | Status: AC
  Administered 2018-08-31: 40 meq via ORAL
  Filled 2018-08-31: qty 2

## 2018-08-31 MED ORDER — FERROUS GLUCONATE 324 (38 FE) MG PO TABS
324.0000 mg | ORAL_TABLET | Freq: Two times a day (BID) | ORAL | Status: DC
  Administered 2018-08-31 – 2018-09-02 (×4): 324 mg via ORAL
  Filled 2018-08-31 (×11): qty 1

## 2018-08-31 MED ORDER — DOCUSATE SODIUM 100 MG PO CAPS
100.0000 mg | ORAL_CAPSULE | Freq: Two times a day (BID) | ORAL | Status: DC
  Administered 2018-08-31 – 2018-09-02 (×5): 100 mg via ORAL
  Filled 2018-08-31 (×5): qty 1

## 2018-08-31 MED ORDER — SODIUM CHLORIDE 0.9% FLUSH: 3.0000 mL | INTRAVENOUS | Status: DC | PRN

## 2018-08-31 MED ORDER — SODIUM CHLORIDE 0.9 % IV SOLN: 250.0000 mL | INTRAVENOUS | Status: DC | PRN

## 2018-08-31 MED ORDER — ATORVASTATIN CALCIUM 20 MG PO TABS
20.0000 mg | ORAL_TABLET | Freq: Every day | ORAL | Status: DC
  Administered 2018-08-31 – 2018-09-01 (×2): 20 mg via ORAL
  Filled 2018-08-31 (×2): qty 1

## 2018-08-31 MED ORDER — LISINOPRIL 5 MG PO TABS
2.5000 mg | ORAL_TABLET | Freq: Every day | ORAL | Status: DC
  Administered 2018-08-31: 2.5 mg via ORAL
  Filled 2018-08-31: qty 1

## 2018-08-31 MED ORDER — SODIUM CHLORIDE 0.9% FLUSH
3.0000 mL | Freq: Two times a day (BID) | INTRAVENOUS | Status: DC
  Administered 2018-08-31 – 2018-09-02 (×5): 3 mL via INTRAVENOUS

## 2018-08-31 MED ORDER — POTASSIUM CHLORIDE CRYS ER 20 MEQ PO TBCR
20.0000 meq | EXTENDED_RELEASE_TABLET | Freq: Two times a day (BID) | ORAL | Status: DC
  Administered 2018-08-31 – 2018-09-02 (×5): 20 meq via ORAL
  Filled 2018-08-31 (×5): qty 1

## 2018-08-31 MED ORDER — CARVEDILOL 12.5 MG PO TABS
25.0000 mg | ORAL_TABLET | Freq: Two times a day (BID) | ORAL | Status: DC
  Administered 2018-08-31 – 2018-09-02 (×5): 25 mg via ORAL
  Filled 2018-08-31 (×5): qty 2

## 2018-08-31 MED ORDER — TAMSULOSIN HCL 0.4 MG PO CAPS
0.4000 mg | ORAL_CAPSULE | Freq: Every day | ORAL | Status: DC
  Administered 2018-08-31 – 2018-09-02 (×3): 0.4 mg via ORAL
  Filled 2018-08-31 (×3): qty 1

## 2018-08-31 MED ORDER — POTASSIUM CHLORIDE CRYS ER 20 MEQ PO TBCR
20.0000 meq | EXTENDED_RELEASE_TABLET | Freq: Once | ORAL | Status: AC
  Administered 2018-08-31: 20 meq via ORAL
  Filled 2018-08-31: qty 1

## 2018-08-31 MED ORDER — ACETAMINOPHEN 325 MG PO TABS: 650.0000 mg | ORAL_TABLET | Freq: Four times a day (QID) | ORAL | Status: DC | PRN

## 2018-08-31 MED ORDER — SPIRONOLACTONE 100 MG PO TABS
100.0000 mg | ORAL_TABLET | Freq: Every day | ORAL | Status: DC
  Administered 2018-08-31 – 2018-09-02 (×3): 100 mg via ORAL
  Filled 2018-08-31 (×4): qty 1

## 2018-08-31 MED ORDER — RIVAROXABAN 20 MG PO TABS
20.0000 mg | ORAL_TABLET | Freq: Every day | ORAL | Status: DC
  Administered 2018-08-31 – 2018-09-01 (×2): 20 mg via ORAL
  Filled 2018-08-31 (×2): qty 1

## 2018-08-31 MED ORDER — ORAL CARE MOUTH RINSE
15.0000 mL | Freq: Two times a day (BID) | OROMUCOSAL | Status: DC
  Administered 2018-08-31 – 2018-09-02 (×4): 15 mL via OROMUCOSAL

## 2018-08-31 MED ORDER — PANTOPRAZOLE SODIUM 40 MG PO TBEC
40.0000 mg | DELAYED_RELEASE_TABLET | Freq: Every evening | ORAL | Status: DC
  Administered 2018-08-31 – 2018-09-01 (×2): 40 mg via ORAL
  Filled 2018-08-31 (×2): qty 1

## 2018-08-31 MED ORDER — ALBUTEROL SULFATE (2.5 MG/3ML) 0.083% IN NEBU: 2.5000 mg | INHALATION_SOLUTION | Freq: Four times a day (QID) | RESPIRATORY_TRACT | Status: DC | PRN

## 2018-08-31 MED ORDER — NITROGLYCERIN 2 % TD OINT
1.0000 [in_us] | TOPICAL_OINTMENT | Freq: Once | TRANSDERMAL | Status: AC
  Administered 2018-08-31: 1 [in_us] via TOPICAL
  Filled 2018-08-31: qty 1

## 2018-08-31 MED ORDER — ACETAMINOPHEN 650 MG RE SUPP: 650.0000 mg | Freq: Four times a day (QID) | RECTAL | Status: DC | PRN

## 2018-08-31 MED ORDER — BUDESONIDE 0.25 MG/2ML IN SUSP
0.2500 mg | Freq: Two times a day (BID) | RESPIRATORY_TRACT | Status: DC
  Administered 2018-08-31 – 2018-09-02 (×5): 0.25 mg via RESPIRATORY_TRACT
  Filled 2018-08-31 (×5): qty 2

## 2018-08-31 MED ORDER — FUROSEMIDE 10 MG/ML IJ SOLN
60.0000 mg | Freq: Two times a day (BID) | INTRAMUSCULAR | Status: AC
  Administered 2018-08-31 – 2018-09-02 (×6): 60 mg via INTRAVENOUS
  Filled 2018-08-31 (×6): qty 6

## 2018-08-31 MED ORDER — ALBUTEROL (5 MG/ML) CONTINUOUS INHALATION SOLN
10.0000 mg/h | INHALATION_SOLUTION | Freq: Once | RESPIRATORY_TRACT | Status: AC
  Administered 2018-08-31: 10 mg/h via RESPIRATORY_TRACT
  Filled 2018-08-31: qty 20

## 2018-08-31 MED ORDER — FUROSEMIDE 10 MG/ML IJ SOLN
80.0000 mg | Freq: Once | INTRAMUSCULAR | Status: AC
  Administered 2018-08-31: 80 mg via INTRAVENOUS
  Filled 2018-08-31: qty 8

## 2018-08-31 NOTE — ED Triage Notes (Signed)
Pt brought from home via [facility]. Pt C/O increased Sob and leg swelling that started 2 days ago. Pt denies pain.

## 2018-08-31 NOTE — Consult Note (Addendum)
Cardiology Consult    Patient ID: [name]; [id number]; 1966-04-11   Admit date: 08/31/2018 Date of Consult: 08/31/2018  Primary Care Provider: Pearson Grippe, MD Primary Cardiologist: Prentice Docker, MD   Patient Profile    [name] is a 53 y.o. male with past medical history of chronic diastolic CHF, permanent atrial fibrillation (on Xarelto), HTN, HLD, and medication noncompliance who is being seen today for the evaluation of CHF at the request of Dr. Sharl Ma.   History of Present Illness    [name] has been admitted multiple times over the past several months for recurrent CHF exacerbations thought to be secondary to medication noncompliance and dietary indiscretion. The most recent was from 2/24 - 08/18/2018 during which BNP was elevated to 287 and diuresed well with IV Lasix  BID. He had a net output of greater than 6L during admission and weight declined from 171.5 kg to 165.2 kg. Was discharged on ASA  daily, Atorvastatin  daily, Bumex  TID, Coreg  BID, Lisinopril 2.5mg  daily, Xarelto  daily, and Spironolactone  daily. He did follow-up with Dr. Purvis Sheffield the day after discharge and weight was recorded at 371 lbs on the office scales. ASA was discontinued given the need for anticoagulation but no changes were made to his diuretic regimen.   By review of notes, he was been followed by [facility] since discharge and the call placed on 08/29/2018 showed he had last filled Xarelto in 09/2017, Bumex in 12/2017, and Spironolactone in 03/2018.  He presented to [facility] ED earlier this morning for progressive dyspnea and lower extremity edema for the past 2 days. He reports not taking Bumex 3 times daily as previously prescribed due to frequent urination. As outlined above, there is concern that he did not even have this at home up until a few days ago when refills were sent in. He says that he was previously intolerant to Lasix and Torsemide as they caused  "worsening swelling along his feet throughout the day". He does not elevate his lower extremities during the day and does not utilize compression stockings. He denies any specific chest pain, orthopnea, or PND.  Does report intermittent palpitations. Says that weight had increased to over 380 lbs on his home scales prior to admission.  Labs show WBC 4.3, Hgb 11.8, platelets 171, Na+ 139, K+ 3.4, and creatinine 1.00. BNP elevated to 341. Initial troponin 0.03.  CXR shows cardiac enlargement with pulmonary vascular congestion and perihilar edema.  EKG showed atrial fibrillation, heart rate 98, with frequent PVCs.  He has been followed on telemetry and rates have been variable in the 90's to 120's.  He has been having frequent PVC's, sometimes occurring in a bigeminal pattern.  He received IV Lasix 80 mg at 0226 and has been started on IV Lasix 60 mg twice daily with a recorded net output of -4.3 L thus far.   Past Medical History:  Diagnosis Date   Allergic rhinitis    Asthma    Atrial fibrillation (HCC)    CHF (congestive heart failure) (HCC)    Essential hypertension    Gastroesophageal reflux disease    Heart attack (HCC)    History of cardiac catheterization    Normal coronaries December 2016   Noncompliance    Major problem leading to declining health and recurrent hospitalization   Nonischemic cardiomyopathy (HCC)    a. LVEF 20-25% in 2017 with cath in 2016 showing normal cors. b. EF 55% by echo in  03/2018.   On home O2    3L N/C   OSA (obstructive sleep apnea)    Osteoarthritis    Peptic ulcer disease     Past Surgical History:  Procedure Laterality Date   CARDIAC CATHETERIZATION N/A 06/14/2015   Procedure: Right/Left Heart Cath and Coronary Angiography;  Surgeon: Runell Gess, MD;  Location: [facility] INVASIVE CV LAB;  Service: Cardiovascular;  Laterality: N/A;     Home Medications:  Prior to Admission medications   Medication Sig Start Date End Date Taking?  Authorizing Provider  albuterol (PROVENTIL) (2.5 MG/3ML) 0.083% nebulizer solution Take 2.5 mg by nebulization every 6 (six) hours as needed for wheezing or shortness of breath.    [provider]  atorvastatin (LIPITOR) 20 MG tablet Take 1 tablet (20 mg total) by mouth daily at 6 PM for 30 days. 08/18/18 09/17/18  Sherryll Burger, Pratik D, DO  beclomethasone (QVAR) 40 MCG/ACT inhaler Inhale 2 puffs into the lungs daily as needed (for shortness of breath).    [provider]  bumetanide (BUMEX) 2 MG tablet Take 2 mg by mouth 3 (three) times daily.    [provider]  carvedilol (COREG) 25 MG tablet Take 1 tablet (25 mg total) by mouth 2 (two) times daily with a meal for 30 days. 08/18/18 09/17/18  Sherryll Burger, Pratik D, DO  docusate sodium (COLACE) 100 MG capsule Take 1 capsule (100 mg total) by mouth 2 (two) times daily. 06/25/16   Kathlen Mody, MD  ferrous gluconate (FERGON) 324 MG tablet Take 1 tablet (324 mg total) by mouth 2 (two) times daily with a meal. 06/25/16   Kathlen Mody, MD  fluticasone (FLOVENT HFA) 220 MCG/ACT inhaler Inhale 2 puffs into the lungs 2 (two) times daily as needed (for shortness of breath).     [provider]  lisinopril (PRINIVIL,ZESTRIL) 2.5 MG tablet Take 1 tablet (2.5 mg total) by mouth daily for 30 days. 08/19/18 09/18/18  Sherryll Burger, Pratik D, DO  OXYGEN Inhale 3.5 L into the lungs daily.    [provider]  pantoprazole (PROTONIX) 40 MG tablet Take 1 tablet (40 mg total) by mouth daily at 6 (six) AM. Patient taking differently: Take 40 mg by mouth every evening.  06/26/16   Kathlen Mody, MD  potassium chloride SA (K-DUR,KLOR-CON) 20 MEQ tablet Take 1 tablet (20 mEq total) by mouth 2 (two) times daily. 06/13/18   Sherryll Burger, Pratik D, DO  rivaroxaban (XARELTO) 20 MG TABS tablet Take 1 tablet (20 mg total) by mouth daily with breakfast. Patient taking differently: Take 20 mg by mouth daily with supper.  06/26/16   Kathlen Mody, MD  spironolactone (ALDACTONE)  100 MG tablet Take 100 mg by mouth daily. For fluid 03/18/17   [provider]  tamsulosin (FLOMAX) 0.4 MG CAPS capsule Take 1 capsule (0.4 mg total) by mouth daily. 06/25/16   Kathlen Mody, MD  potassium chloride 20 MEQ TBCR Take 20 mEq by mouth 2 (two) times daily. 09/03/15 09/03/15  Ward, Layla Maw, DO    Inpatient Medications: Scheduled Meds:  atorvastatin  20 mg Oral q1800   budesonide  0.25 mg Inhalation BID   carvedilol  25 mg Oral BID WC   docusate sodium  100 mg Oral BID   ferrous gluconate  324 mg Oral BID WC   furosemide  60 mg Intravenous BID   lisinopril  2.5 mg Oral Daily   mouth rinse  15 mL Mouth Rinse BID   pantoprazole  40 mg Oral  QPM   potassium chloride SA  20 mEq Oral BID   rivaroxaban  20 mg Oral Q supper   sodium chloride flush  3 mL Intravenous Q12H   spironolactone  100 mg Oral Daily   tamsulosin  0.4 mg Oral Daily   Continuous Infusions:  sodium chloride     PRN Meds: sodium chloride, acetaminophen **OR** acetaminophen, albuterol, sodium chloride flush  Allergies:    Allergies  Allergen Reactions   Banana Shortness Of Breath   Bee Venom Shortness Of Breath, Swelling and Other (See Comments)    Reaction:  Facial swelling   Food Shortness Of Breath, Rash and Other (See Comments)    Pt states that he is allergic to strawberries.     Aspirin Other (See Comments)    Reaction:  GI upset    Metolazone Other (See Comments)    Pt states that he stopped taking this med due to heart attack like symptoms.    Oatmeal Nausea And Vomiting   Orange Juice [Orange Oil] Nausea And Vomiting    All acidic products make him nauseous and upset stomach   Torsemide Swelling and Other (See Comments)    Reaction:  Swelling of feet/legs    Diltiazem Palpitations   Hydralazine Palpitations   Lipitor [Atorvastatin] Other (See Comments)    Reaction:  Nose bleeds     Social History:   Social History   Socioeconomic History   Marital  status: Divorced    Spouse name: Not on file   Number of children: 3   Years of education: Not on file   Highest education level: Not on file  Occupational History   Occupation: disabled  Social Network engineer strain: Patient refused   Food insecurity:    Worry: Patient refused    Inability: Patient refused   Transportation needs:    Medical: Patient refused    Non-medical: Patient refused  Tobacco Use   Smoking status: Former Smoker    Packs/day: 0.50    Years: 20.00    Pack years: 10.00    Types: Cigarettes    Start date: 04/26/1988    Last attempt to quit: 06/23/2007    Years since quitting: 11.1   Smokeless tobacco: Never Used   Tobacco comment: 1 ppd former smoker  Substance and Sexual Activity   Alcohol use: No    Alcohol/week: 0.0 standard drinks    Comment: No etoh since 2009   Drug use: No   Sexual activity: Never  Lifestyle   Physical activity:    Days per week: Patient refused    Minutes per session: Patient refused   Stress: Patient refused  Relationships   Social connections:    Talks on phone: Patient refused    Gets together: Patient refused    Attends religious service: Patient refused    Active member of club or organization: Patient refused    Attends meetings of clubs or organizations: Patient refused    Relationship status: Patient refused   Intimate partner violence:    Fear of current or ex partner: Patient refused    Emotionally abused: Patient refused    Physically abused: Patient refused    Forced sexual activity: Patient refused  Other Topics Concern   Not on file  Social History Narrative   Not on file     Family History:    Family History  Problem Relation Age of Onset   Stroke Father    Heart attack Father  Aneurysm Mother        Cerebral aneurysm   Hypertension Sister    Colon cancer Neg Hx    Inflammatory bowel disease Neg Hx    Liver disease Neg Hx       Review of Systems      General:  No chills, fever, night sweats or weight changes.  Cardiovascular:  No chest pain, orthopnea, paroxysmal nocturnal dyspnea. Positive for dyspnea on exertion and edema.  Dermatological: No rash, lesions/masses Respiratory: No cough, dyspnea Urologic: No hematuria, dysuria Abdominal:   No nausea, vomiting, diarrhea, bright red blood per rectum, melena, or hematemesis Neurologic:  No visual changes, wkns, changes in mental status. All other systems reviewed and are otherwise negative except as noted above.  Physical Exam/Data    Vitals:   08/31/18 0639 08/31/18 0641 08/31/18 0700 08/31/18 0800  BP: (!) 158/96  (!) 153/99 (!) 168/109  Pulse: (!) 108   (!) 108  Resp: 17   14  Temp:  98.6 F (37 C)    TempSrc:  Oral    SpO2: 98%   98%  Weight:  (!) 174.1 kg    Height:  6' (1.829 m)      Intake/Output Summary (Last 24 hours) at 08/31/2018 0859 Last data filed at 08/31/2018 0800 Gross per 24 hour  Intake 50 ml  Output 4410 ml  Net -4360 ml   Filed Weights   08/31/18 0641  Weight: (!) 174.1 kg   Body mass index is 52.06 kg/m.   General: Pleasant, obese African American male appearing in NAD Psych: Normal affect. Neuro: Alert and oriented X 3. Moves all extremities spontaneously. HEENT: Normal  Neck: Supple without bruits. JVD difficult to assess secondary to body habitus but appears elevated. Lungs:  Resp regular and unlabored, decreased along bases bilaterally. Heart: Irregularly irregular. No s3, s4, or murmurs. Abdomen: Soft, non-tender, non-distended, BS + x 4.  Extremities: No clubbing or cyanosis. Chronic 2+ pitting edema bilaterally. DP/PT/Radials 2+ and equal bilaterally.   EKG:  The EKG was personally reviewed and demonstrates: Atrial fibrillation, heart rate 98, with frequent PVC's.  Telemetry:  Telemetry was personally reviewed and demonstrates: Atrial fibrillation, rates in 90's to 120's. Frequent PVC's, sometimes occurring in a bigeminal pattern.     Labs/Studies     Relevant CV Studies:   Cardiac Catheterization: 2016 IMPRESSION:Mr. Suver has normal coronary arteries with pulmonary hypertension and elevated LVEDP. He has diastolic heart failure and hypertensive heart disease. Sheath removed and pressure held. He will treated medically including optimization of his diuretics and vasodilators.  Echocardiogram: 03/2018 Study Conclusions  - Left ventricle: The cavity size was normal. Wall thickness was   increased in a pattern of moderate LVH. The estimated ejection   fraction was 55%. Wall motion was normal; there were no regional   wall motion abnormalities. The study was not technically   sufficient to allow evaluation of LV diastolic dysfunction due to   atrial fibrillation. - Aortic valve: Mildly calcified annulus. Trileaflet. - Mitral valve: Mildly thickened leaflets. There was trivial   regurgitation. - Left atrium: The atrium was moderately to severely dilated. - Right atrium: Central venous pressure (est): 3 mm Hg. - Tricuspid valve: There was trivial regurgitation. - Pulmonary arteries: Systolic pressure could not be accurately   estimated. - Pericardium, extracardiac: A trivial pericardial effusion was   identified.  Laboratory Data:  Chemistry Recent Labs  Lab 08/31/18 0134  NA 139  K 3.4*  CL 109  CO2 24  GLUCOSE 97  BUN 13  CREATININE 1.00  CALCIUM 8.1*  GFRNONAA >60  GFRAA >60  ANIONGAP 6    Recent Labs  Lab 08/31/18 0134  PROT 6.4*  ALBUMIN 3.5  AST 19  ALT 15  ALKPHOS 61  BILITOT 0.8   Hematology Recent Labs  Lab 08/31/18 0134  WBC 4.3  RBC 4.18*  HGB 11.8*  HCT 37.3*  MCV 89.2  MCH 28.2  MCHC 31.6  RDW 14.0  PLT 171   Cardiac Enzymes Recent Labs  Lab 08/31/18 0134  TROPONINI 0.03*   No results for input(s): TROPIPOC in the last 168 hours.  BNP Recent Labs  Lab 08/31/18 0134  BNP 341.0*    DDimer No results for input(s): DDIMER in the last 168  hours.  Radiology/Studies:  Dg Chest Port 1 View  Result Date: 08/31/2018 CLINICAL DATA:  Shortness of breath and lower leg swelling for 2 days. Worsening. History of CHF, hypertension, asthma, atrial fibrillation, former smoker. EXAM: PORTABLE CHEST 1 VIEW COMPARISON:  08/15/2018 FINDINGS: Cardiac enlargement with pulmonary vascular congestion. Perihilar infiltration suggesting edema. No blunting of costophrenic angles. No pneumothorax. Mediastinal contours appear intact. IMPRESSION: Cardiac enlargement with pulmonary vascular congestion and perihilar edema. Electronically Signed   By: Burman Nieves M.D.   On: 08/31/2018 01:41     Assessment & Plan    1. Acute on Chronic Diastolic CHF - He has experienced multiple admissions for recurrent CHF exacerbations and in talking with the patient he is noncompliant with TID Bumex dosing and also consumes a high sodium diet. He reports prior intolerances to Lasix and Torsemide but I suspect that his worsening edema throughout the day was secondary to him not elevating his lower extremities and was dependent. -  BNP elevated to 341 and CXR shows cardiac enlargement with pulmonary vascular congestion and perihilar edema. Received IV Lasix 80 mg at 0226 and has been started on IV Lasix 60 mg twice daily with a recorded net output of -4.3 L thus far. Would continue on current dosing for now and reassess volume status and kidney function tomorrow. May need further titration of Lasix to 80 mg twice daily but would hold off for now given his frequent doses earlier today. May consider re-challenging with Torsemide at the time of discharge for improved bioavailability. Continue to follow I&O's along with daily weights.   2. Permanent Atrial Fibrillation - Rates have been variable in the 90's to 120's by review of telemetry and he is having frequent PVC's, sometimes occurring in a bigeminal pattern. Continue Coreg 25 mg twice daily for rate control. Can be further  titrated if additional rate control is needed given that his body habitus is greater than 85 kg. - He denies any evidence of active bleeding. Continue Xarelto for anticoagulation.  3. HTN - BP has been variable at 131/65 -168/109 since admission. As outlined above, there is concern he has been noncompliant with his medications as an outpatient. He has been restarted on PTA Lisinopril 2.5 mg daily, Spironolactone 100 mg daily, and Coreg 25 mg twice daily. Will continue to follow BP with his current medication regimen and Lisinopril can be further titrated if BP remains above goal.  4. HLD - continue PTA Atorvastatin 20mg  daily.   5. Hypokalemia - K+ 3.4 on admission. Supplementation has been ordered by the admitting team. Repeat BMET in AM for reassessment given significant diuresis.    For questions or updates, please contact [facility] Please consult [email or web address] for contact info  under Cardiology/STEMI.  Signed, Ellsworth LennoxBrittany M Strader, PA-C 08/31/2018, 8:59 AM Pager: [phone]  The patient was seen and examined, and I agree with the history, physical exam, assessment and plan as documented above, with modifications as noted below. I have also personally reviewed all relevant documentation, old records, labs, and both radiographic and cardiovascular studies. I have also independently interpreted old and new ECG's.  Briefly, this is an unfortunate 53 year old gentleman who has had multiple hospitalizations over the last several years for decompensated heart failure due to dietary and medication noncompliance.  I recently saw him in the office on 08/19/2018 with his [facility] Care [facility], Juanell FairlyFelecia McCray RN. He had just been discharged the day prior with a discharge weight of 165.2 kg and was placed on Bumex 2 mg 3 times daily and spironolactone 100 mg.  She had planned on visiting with him at his house in the near future to assess medication compliance and dietary regimen.  He  admitted to eating a lot of food prepared with salt.  Echocardiogram on 04/01/2018 demonstrated normalization of left ventricular systolic function, LVEF 55%, with moderate LVH.  There was moderate to severe left atrial dilatation.  I reviewed the patient outreach phone call by the clinical pharmacist Berlin HunJennifer Mendenhall PharmD on 08/29/2018.  It appears Xarelto was last filled in April 2019, Bumex was last filled in July 2019, spironolactone was last filled in October 2019.  He was given prescriptions for potassium in November 2019 in January 2020 but they were never filled.  These have been ongoing issues and he claims that diuretics make his leg swollen in spite of numerous attempts by myself and other providers that this is just not the case.  Chest x-ray showed CHF and is currently on IV Lasix 60 mg twice daily after having received 80 mg IV earlier this morning.  He is put out over 4.3 L of recorded output and there is a full urinal at the side of the bed which has yet to be recorded.  Continue carvedilol, spironolactone, lisinopril, and Xarelto.  If blood pressure remains elevated, the dose of lisinopril can be increased. Prentice DockerSuresh Verna Hamon, MD, [facility]  08/31/2018 9:33 AM

## 2018-08-31 NOTE — H&P (Addendum)
TRH H&P    Patient Demographics:    [name], is a 53 y.o. male  MRN: [id number]  DOB - July 07, 1965  Admit Date - 08/31/2018  Referring MD/NP/PA: Dr. Lynelle Doctor  Outpatient Primary MD for the patient is Pearson Grippe, MD  Patient coming from: Home  Chief complaint-shortness of breath   HPI:    [name]  is a 53 y.o. male, with a history of chronic diastolic CHF, nonischemic cardiomyopathy, last EF 55% with moderate LVH, atrial fibrillation on anticoagulation on Xarelto, asthma, Hypertension came to hospital with shortness of breath and swelling of the legs.  Had one episode of chest pain while he was in the ambulance.  Denies chest pain at this time.  Patient is on oxygen at home 3 L/min via nasal cannula.  In the EDLab work showed BNP of 341, chest x-ray showed cardiac enlargement with pulmonary vascular congestion and perihilar edema patient was given IV Lasix, diuresed well but still short of breath. Denies nausea vomiting or diarrhea. Denies abdominal pain. Denies dysuria No fever or chills.    Review of systems:    In addition to the HPI above,    All other systems reviewed and are negative.    Past History of the following :    Past Medical History:  Diagnosis Date  . Allergic rhinitis   . Asthma   . Atrial fibrillation (HCC)   . CHF (congestive heart failure) (HCC)   . Essential hypertension   . Gastroesophageal reflux disease   . Heart attack (HCC)   . History of cardiac catheterization    Normal coronaries December 2016  . Noncompliance    Major problem leading to declining health and recurrent hospitalization  . Nonischemic cardiomyopathy (HCC)    LVEF 20-25%  . On home O2    3L N/C  . OSA (obstructive sleep apnea)   . Osteoarthritis   . Peptic ulcer disease       Past Surgical History:  Procedure Laterality Date  . CARDIAC CATHETERIZATION N/A 06/14/2015   Procedure:  Right/Left Heart Cath and Coronary Angiography;  Surgeon: Runell Gess, MD;  Location: [facility] INVASIVE CV LAB;  Service: Cardiovascular;  Laterality: N/A;      Social History:      Social History   Tobacco Use  . Smoking status: Former Smoker    Packs/day: 0.50    Years: 20.00    Pack years: 10.00    Types: Cigarettes    Start date: 04/26/1988    Last attempt to quit: 06/23/2007    Years since quitting: 11.1  . Smokeless tobacco: Never Used  . Tobacco comment: 1 ppd former smoker  Substance Use Topics  . Alcohol use: No    Alcohol/week: 0.0 standard drinks    Comment: No etoh since 2009       Family History :     Family History  Problem Relation Age of Onset  . Stroke Father   . Heart attack Father   . Aneurysm Mother  Cerebral aneurysm  . Hypertension Sister   . Colon cancer Neg Hx   . Inflammatory bowel disease Neg Hx   . Liver disease Neg Hx       Home Medications:   Prior to Admission medications   Medication Sig Start Date End Date Taking? Authorizing Provider  albuterol (PROVENTIL) (2.5 MG/3ML) 0.083% nebulizer solution Take 2.5 mg by nebulization every 6 (six) hours as needed for wheezing or shortness of breath.    [provider]  atorvastatin (LIPITOR) 20 MG tablet Take 1 tablet (20 mg total) by mouth daily at 6 PM for 30 days. 08/18/18 09/17/18  Sherryll Burger, Pratik D, DO  beclomethasone (QVAR) 40 MCG/ACT inhaler Inhale 2 puffs into the lungs daily as needed (for shortness of breath).    [provider]  bumetanide (BUMEX) 2 MG tablet Take 2 mg by mouth 3 (three) times daily.    [provider]  carvedilol (COREG) 25 MG tablet Take 1 tablet (25 mg total) by mouth 2 (two) times daily with a meal for 30 days. 08/18/18 09/17/18  Sherryll Burger, Pratik D, DO  docusate sodium (COLACE) 100 MG capsule Take 1 capsule (100 mg total) by mouth 2 (two) times daily. 06/25/16   Kathlen Mody, MD  ferrous gluconate (FERGON) 324 MG tablet Take 1 tablet (324 mg  total) by mouth 2 (two) times daily with a meal. 06/25/16   Kathlen Mody, MD  fluticasone (FLOVENT HFA) 220 MCG/ACT inhaler Inhale 2 puffs into the lungs 2 (two) times daily as needed (for shortness of breath).     [provider]  lisinopril (PRINIVIL,ZESTRIL) 2.5 MG tablet Take 1 tablet (2.5 mg total) by mouth daily for 30 days. 08/19/18 09/18/18  Sherryll Burger, Pratik D, DO  OXYGEN Inhale 3.5 L into the lungs daily.    [provider]  pantoprazole (PROTONIX) 40 MG tablet Take 1 tablet (40 mg total) by mouth daily at 6 (six) AM. Patient taking differently: Take 40 mg by mouth every evening.  06/26/16   Kathlen Mody, MD  potassium chloride SA (K-DUR,KLOR-CON) 20 MEQ tablet Take 1 tablet (20 mEq total) by mouth 2 (two) times daily. 06/13/18   Sherryll Burger, Pratik D, DO  rivaroxaban (XARELTO) 20 MG TABS tablet Take 1 tablet (20 mg total) by mouth daily with breakfast. Patient taking differently: Take 20 mg by mouth daily with supper.  06/26/16   Kathlen Mody, MD  spironolactone (ALDACTONE) 100 MG tablet Take 100 mg by mouth daily. For fluid 03/18/17   [provider]  tamsulosin (FLOMAX) 0.4 MG CAPS capsule Take 1 capsule (0.4 mg total) by mouth daily. 06/25/16   Kathlen Mody, MD  potassium chloride 20 MEQ TBCR Take 20 mEq by mouth 2 (two) times daily. 09/03/15 09/03/15  Ward, Layla Maw, DO     Allergies:     Allergies  Allergen Reactions  . Banana Shortness Of Breath  . Bee Venom Shortness Of Breath, Swelling and Other (See Comments)    Reaction:  Facial swelling  . Food Shortness Of Breath, Rash and Other (See Comments)    Pt states that he is allergic to strawberries.    . Aspirin Other (See Comments)    Reaction:  GI upset   . Metolazone Other (See Comments)    Pt states that he stopped taking this med due to heart attack like symptoms.   . Oatmeal Nausea And Vomiting  . Orange Juice [Orange Oil] Nausea And Vomiting    All acidic products make him nauseous  and upset stomach  .  Torsemide Swelling and Other (See Comments)    Reaction:  Swelling of feet/legs   . Diltiazem Palpitations  . Hydralazine Palpitations  . Lipitor [Atorvastatin] Other (See Comments)    Reaction:  Nose bleeds      Physical Exam:   Vitals  Blood pressure (!) 141/84, pulse (!) 53, temperature 98.8 F (37.1 C), temperature source Oral, resp. rate (!) 24, SpO2 100 %.  1.  General: Morbidly obese male in no acute distress  2. Psychiatric: Alert, oriented x3, intact insight and judgment  3. Neurologic: Cranial nerves II through XII grossly intact, no focal deficit noted  4. HEENMT:  Atraumatic normocephalic, extraocular muscles intact  5. Respiratory : Decreased breath sounds bilaterally  6. Cardiovascular : S1-S2, regular, no murmur auscultated, bilateral 2+ pitting edema of the lower extremities  7. Gastrointestinal:  Abdomen is soft, nontender, no organomegaly      Data Review:    CBC Recent Labs  Lab 08/31/18 0134  WBC 4.3  HGB 11.8*  HCT 37.3*  PLT 171  MCV 89.2  MCH 28.2  MCHC 31.6  RDW 14.0  LYMPHSABS 0.9  MONOABS 0.4  EOSABS 0.1  BASOSABS 0.0   ------------------------------------------------------------------------------------------------------------------  Results for orders placed or performed during the hospital encounter of 08/31/18 (from the past 48 hour(s))  Comprehensive metabolic panel     Status: Abnormal   Collection Time: 08/31/18  1:34 AM  Result Value Ref Range   Sodium 139 135 - 145 mmol/L   Potassium 3.4 (L) 3.5 - 5.1 mmol/L   Chloride 109 98 - 111 mmol/L   CO2 24 22 - 32 mmol/L   Glucose, Bld 97 70 - 99 mg/dL   BUN 13 6 - 20 mg/dL   Creatinine, Ser 1.19 0.61 - 1.24 mg/dL   Calcium 8.1 (L) 8.9 - 10.3 mg/dL   Total Protein 6.4 (L) 6.5 - 8.1 g/dL   Albumin 3.5 3.5 - 5.0 g/dL   AST 19 15 - 41 U/L   ALT 15 0 - 44 U/L   Alkaline Phosphatase 61 38 - 126 U/L   Total Bilirubin 0.8 0.3 - 1.2 mg/dL   GFR calc non Af Amer >60 >60  mL/min   GFR calc Af Amer >60 >60 mL/min   Anion gap 6 5 - 15    Comment: Performed at [facility], [address], [place], [place] [address]  Troponin I - Once     Status: Abnormal   Collection Time: 08/31/18  1:34 AM  Result Value Ref Range   Troponin I 0.03 (HH) <0.03 ng/mL    Comment: CRITICAL RESULT CALLED TO, READ BACK BY AND VERIFIED WITH: B MYRICK,RN  08/31/18 MKELLY Performed at [facility], [address], [place], [place] [address]   Brain natriuretic peptide     Status: Abnormal   Collection Time: 08/31/18  1:34 AM  Result Value Ref Range   B Natriuretic Peptide 341.0 (H) 0.0 - 100.0 pg/mL    Comment: Performed at [facility], [address], [place], [place] [address]  CBC with Differential     Status: Abnormal   Collection Time: 08/31/18  1:34 AM  Result Value Ref Range   WBC 4.3 4.0 - 10.5 K/uL   RBC 4.18 (L) 4.22 - 5.81 MIL/uL   Hemoglobin 11.8 (L) 13.0 - 17.0 g/dL   HCT 78.4 (L) 69.6 - 29.5 %   MCV 89.2 80.0 - 100.0 fL   MCH 28.2 26.0 - 34.0  pg   MCHC 31.6 30.0 - 36.0 g/dL   RDW 64.6 80.3 - 21.2 %   Platelets 171 150 - 400 K/uL   nRBC 0.0 0.0 - 0.2 %   Neutrophils Relative % 67 %   Neutro Abs 2.9 1.7 - 7.7 K/uL   Lymphocytes Relative 21 %   Lymphs Abs 0.9 0.7 - 4.0 K/uL   Monocytes Relative 10 %   Monocytes Absolute 0.4 0.1 - 1.0 K/uL   Eosinophils Relative 2 %   Eosinophils Absolute 0.1 0.0 - 0.5 K/uL   Basophils Relative 0 %   Basophils Absolute 0.0 0.0 - 0.1 K/uL   Immature Granulocytes 0 %   Abs Immature Granulocytes 0.01 0.00 - 0.07 K/uL    Comment: Performed at [facility], [address] [place], [place] [address]  Blood gas, venous     Status: None   Collection Time: 08/31/18  1:34 AM  Result Value Ref Range   FIO2 32.00    O2 Content 100.0 L/min   pH, Ven 7.359 7.250 - 7.430   pCO2, Ven 47.6 44.0 - 60.0 mmHg   pO2, Ven 43.2 32.0 - 45.0 mmHg   Bicarbonate 24.6 20.0 - 28.0 mmol/L   Acid-Base Excess 1.3 0.0 - 2.0 mmol/L   O2  Saturation 71.9 %   Patient temperature 37.0     Comment: Performed at [facility], [address], [place], [place] [address]    Chemistries  Recent Labs  Lab 08/31/18 0134  NA 139  K 3.4*  CL 109  CO2 24  GLUCOSE 97  BUN 13  CREATININE 1.00  CALCIUM 8.1*  AST 19  ALT 15  ALKPHOS 61  BILITOT 0.8   ------------------------------------------------------------------------------------------------------------------  ------------------------------------------------------------------------------------------------------------------ GFR: Estimated Creatinine Clearance: 142.4 mL/min (by C-G formula based on SCr of 1 mg/dL). Liver Function Tests: Recent Labs  Lab 08/31/18 0134  AST 19  ALT 15  ALKPHOS 61  BILITOT 0.8  PROT 6.4*  ALBUMIN 3.5   No results for input(s): LIPASE, AMYLASE in the last 168 hours. No results for input(s): AMMONIA in the last 168 hours. Coagulation Profile: No results for input(s): INR, PROTIME in the last 168 hours. Cardiac Enzymes: Recent Labs  Lab 08/31/18 0134  TROPONINI 0.03*   BNP (last 3 results) No results for input(s): PROBNP in the last 8760 hours. HbA1C: No results for input(s): HGBA1C in the last 72 hours. CBG: No results for input(s): GLUCAP in the last 168 hours. Lipid Profile: No results for input(s): CHOL, HDL, LDLCALC, TRIG, CHOLHDL, LDLDIRECT in the last 72 hours. Thyroid Function Tests: No results for input(s): TSH, T4TOTAL, FREET4, T3FREE, THYROIDAB in the last 72 hours. Anemia Panel: No results for input(s): VITAMINB12, FOLATE, FERRITIN, TIBC, IRON, RETICCTPCT in the last 72 hours.  --------------------------------------------------------------------------------------------------------------- Urine analysis:    Component Value Date/Time   COLORURINE YELLOW 04/01/2018 0332   APPEARANCEUR CLEAR 04/01/2018 0332   LABSPEC 1.008 04/01/2018 0332   PHURINE 7.0 04/01/2018 0332   GLUCOSEU NEGATIVE 04/01/2018 0332    GLUCOSEU NEG mg/dL 48/88/9169 4503   HGBUR NEGATIVE 04/01/2018 0332   BILIRUBINUR NEGATIVE 04/01/2018 0332   KETONESUR NEGATIVE 04/01/2018 0332   PROTEINUR NEGATIVE 04/01/2018 0332   UROBILINOGEN 0.2 08/08/2014 1445   NITRITE NEGATIVE 04/01/2018 0332   LEUKOCYTESUR NEGATIVE 04/01/2018 0332      Imaging Results:    Dg Chest Port 1 View  Result Date: 08/31/2018 CLINICAL DATA:  Shortness of breath and lower leg swelling for 2 days. Worsening. History of  CHF, hypertension, asthma, atrial fibrillation, former smoker. EXAM: PORTABLE CHEST 1 VIEW COMPARISON:  08/15/2018 FINDINGS: Cardiac enlargement with pulmonary vascular congestion. Perihilar infiltration suggesting edema. No blunting of costophrenic angles. No pneumothorax. Mediastinal contours appear intact. IMPRESSION: Cardiac enlargement with pulmonary vascular congestion and perihilar edema. Electronically Signed   By: Burman Nieves M.D.   On: 08/31/2018 01:41    My personal review of EKG: Rhythm atrial fibrillation   Assessment & Plan:    Active Problems:   Acute exacerbation of CHF (congestive heart failure) (HCC)   1. Acute on chronic diastolic CHF-patient presenting with worsening shortness of breath, received 80 mg of Lasix IV in the ED.  Will start Lasix 60 mg IV every 12 hours.  Strict intake and output.  Daily weights.  Check renal function in a.m.Consult cardiology for further recommendations.  2. Permanent atrial fibrillation-heart rate is controlled, continue Coreg. CHA2DS2VASc score of 3.  Patient is on anticoagulation with Xarelto.  3. Hypertension-blood pressures controlled, continue lisinopril, Coreg  4. Hyperlipidemia-continue Lipitor  5. History of asthma-continue Qvar 1 inhalation twice a day , albuterol as needed.   DVT Prophylaxis-Xarelto  AM Labs Ordered, also please review Full Orders  Family Communication: Admission, patients condition and plan of care including tests being ordered have been  discussed with the patient  who indicate understanding and agree with the plan and Code Status.  Code Status: Full code  Admission status: Inpatient: Based on patients clinical presentation and evaluation of above clinical data, I have made determination that patient meets Inpatient criteria at this time.  Time spent in minutes : 60 minutes   Meredeth Ide M.D on 08/31/2018 at 5:24 AM

## 2018-08-31 NOTE — Progress Notes (Signed)
PROGRESS NOTE  [name] XNT:[id number] DOB: January 31, 1966 DOA: 08/31/2018 PCP: Pearson Grippe, MD  Brief History:  53 y/o male with with medical history of history significant of sCHF (numerous admissions for uncompensated CHF but patient continues to be noncompliant with diet and medications outpatient and has been fired from numerous cardiology clinics secondary to no showing for appointments), atrial fibrillation (on xarelto), HTN, MI, asthma, OSA (no on CPAP or BiPap) presents with progressive leg swelling and sob since d/c from his last admission 08/15/18-08/18/18. The patient presents again with 2 days of shortness of breath and worsening lower extremity edema as well as increasing abdominal girth.  He denies any fevers, chills, headache, nausea, vomiting, diarrhea.  He has a nonproductive cough.  He is chronically on 3 L nasal cannula.  He endorses compliance with his medications, but he has had a documented outpatient visit with our clinical pharmacist who documented that the patient has not had his Bumex filled since July 2019.  Xarelto last refilled on 09/2017 and spiro last filled 03/2018.  In addition, the patient has had indiscretion with his dietary intake.  On the day of admission, the patient ate a few hotdogs, fried fish, and hash puppies.  Assessment/Plan: Acute on chronic systolic and diastolic CHF -His prior dry weight 312 lbs on 02/08/17 -2/27 discharge weight 364 lbs -2/28 office weight 371 lbs - continuelasix to 60mg  IV BID - 11/10/16 Echo--EF 35-40 percent, diffuse HK, PSP 56, mild MR, +PFO -04/01/18 Echo EF 55%, no WMA - fluid restriction -reset dry weight--likely be weighing heavier now without fluid retention due to eating habits and sedentary lifestyle. - pt states he eats "french fries" regularly -continue spiro, coreg, IV lasix  Permanent atrial fibrillation - Continue xarelto - continuecoreg  Chronic respiratory Failure with hypoxia -chronically  on 3L at home  HTN - continue coreg, spiro, lasix -improving with diuresis  GERD - continue protoninx  Hyperlipidemia -continue statin  Chronic lymphedema -pt noncompliant with therapies  CKD stage 2 -baseline creatinine 1.0-1.3 -monitor with diuresis  OSA -noncompliant with CPAP  Hypokalemia -replete -check mag  Morbid Obesity -BMI 52.06 -lifestyle modification    Disposition Plan:   Home in 2-3 days  Family Communication:  No Family at bedside  Consultants:  cardiology  Code Status:  FULL  DVT Prophylaxis:  Xarelto   Procedures: As Listed in Progress Note Above    Total time spent 35 minutes.  Greater than 50% spent face to face counseling and coordinating care. 0740 to 0815     Subjective: Patient denies fevers, chills, headache, chest pain, dyspnea, nausea, vomiting, diarrhea, abdominal pain, dysuria, hematuria, hematochezia, and melena.  Objective: Vitals:   08/31/18 0600 08/31/18 0639 08/31/18 0641 08/31/18 0700  BP: (!) 155/96 (!) 158/96  (!) 153/99  Pulse:  (!) 108    Resp: (!) 22 17    Temp:   98.6 F (37 C)   TempSrc:   Oral   SpO2:  98%    Weight:   (!) 174.1 kg   Height:   6' (1.829 m)     Intake/Output Summary (Last 24 hours) at 08/31/2018 0754 Last data filed at 08/31/2018 0700 Gross per 24 hour  Intake -  Output 3910 ml  Net -3910 ml   Weight change:  Exam:   General:  Pt is alert, follows commands appropriately, not in acute distress  HEENT: No icterus, No thrush, No neck mass, [place]/AT  Cardiovascular: IRRR, S1/S2, no rubs, no gallops  Respiratory: bibasilar crackles, no wheeze  Abdomen: Soft/+BS, non tender, non distended, no guarding  Extremities: 3+LE edema, No lymphangitis, No petechiae, No rashes, no synovitis   Data Reviewed: I have personally reviewed following labs and imaging studies Basic Metabolic Panel: Recent Labs  Lab 08/31/18 0134  NA 139  K 3.4*  CL 109  CO2 24  GLUCOSE 97  BUN 13   CREATININE 1.00  CALCIUM 8.1*   Liver Function Tests: Recent Labs  Lab 08/31/18 0134  AST 19  ALT 15  ALKPHOS 61  BILITOT 0.8  PROT 6.4*  ALBUMIN 3.5   No results for input(s): LIPASE, AMYLASE in the last 168 hours. No results for input(s): AMMONIA in the last 168 hours. Coagulation Profile: No results for input(s): INR, PROTIME in the last 168 hours. CBC: Recent Labs  Lab 08/31/18 0134  WBC 4.3  NEUTROABS 2.9  HGB 11.8*  HCT 37.3*  MCV 89.2  PLT 171   Cardiac Enzymes: Recent Labs  Lab 08/31/18 0134  TROPONINI 0.03*   BNP: Invalid input(s): POCBNP CBG: No results for input(s): GLUCAP in the last 168 hours. HbA1C: No results for input(s): HGBA1C in the last 72 hours. Urine analysis:    Component Value Date/Time   COLORURINE YELLOW 04/01/2018 0332   APPEARANCEUR CLEAR 04/01/2018 0332   LABSPEC 1.008 04/01/2018 0332   PHURINE 7.0 04/01/2018 0332   GLUCOSEU NEGATIVE 04/01/2018 0332   GLUCOSEU NEG mg/dL 58/52/7782 4235   HGBUR NEGATIVE 04/01/2018 0332   BILIRUBINUR NEGATIVE 04/01/2018 0332   KETONESUR NEGATIVE 04/01/2018 0332   PROTEINUR NEGATIVE 04/01/2018 0332   UROBILINOGEN 0.2 08/08/2014 1445   NITRITE NEGATIVE 04/01/2018 0332   LEUKOCYTESUR NEGATIVE 04/01/2018 0332   Sepsis Labs: @LABRCNTIP (procalcitonin:4,lacticidven:4) )No results found for this or any previous visit (from the past 240 hour(s)).   Scheduled Meds: . atorvastatin  20 mg Oral q1800  . budesonide  0.25 mg Inhalation BID  . carvedilol  25 mg Oral BID WC  . docusate sodium  100 mg Oral BID  . ferrous gluconate  324 mg Oral BID WC  . furosemide  60 mg Intravenous BID  . lisinopril  2.5 mg Oral Daily  . mouth rinse  15 mL Mouth Rinse BID  . pantoprazole  40 mg Oral QPM  . potassium chloride SA  20 mEq Oral BID  . rivaroxaban  20 mg Oral Q supper  . sodium chloride flush  3 mL Intravenous Q12H  . spironolactone  100 mg Oral Daily  . tamsulosin  0.4 mg Oral Daily   Continuous  Infusions: . sodium chloride      Procedures/Studies: Dg Chest 2 View  Result Date: 08/15/2018 CLINICAL DATA:  53 y/o M; shortness of breath, history of heart failure. EXAM: CHEST - 2 VIEW COMPARISON:  08/03/2018 chest radiograph FINDINGS: Stable cardiomegaly given projection and technique. Diffuse reticular and hazy basilar opacities of the lungs. Possible small right effusion. No pneumothorax. No acute osseous abnormality is evident. IMPRESSION: Cardiomegaly, interstitial pulmonary edema, and mild alveolar edema. Possible small right effusion. Electronically Signed   By: Mitzi Hansen M.D.   On: 08/15/2018 02:01   Dg Chest Port 1 View  Result Date: 08/31/2018 CLINICAL DATA:  Shortness of breath and lower leg swelling for 2 days. Worsening. History of CHF, hypertension, asthma, atrial fibrillation, former smoker. EXAM: PORTABLE CHEST 1 VIEW COMPARISON:  08/15/2018 FINDINGS: Cardiac enlargement with pulmonary vascular congestion. Perihilar infiltration suggesting edema. No blunting of  costophrenic angles. No pneumothorax. Mediastinal contours appear intact. IMPRESSION: Cardiac enlargement with pulmonary vascular congestion and perihilar edema. Electronically Signed   By: Burman Nieves M.D.   On: 08/31/2018 01:41   Dg Chest Portable 1 View  Result Date: 08/03/2018 CLINICAL DATA:  53 year old male with a history of leg edema shortness of breath EXAM: PORTABLE CHEST 1 VIEW COMPARISON:  07/25/2018, 07/22/2018, 07/21/2018 FINDINGS: Cardiomediastinal silhouette unchanged with cardiomegaly. Fullness in the central vasculature with interlobular septal thickening. No pneumothorax. No large pleural effusion. No displaced fracture. IMPRESSION: Cardiomegaly with evidence of mild pulmonary edema. Atypical infection not excluded. Electronically Signed   By: Gilmer Mor D.O.   On: 08/03/2018 19:03    Catarina Hartshorn, DO  [facility] Pager [phone]  If 7PM-7AM, please contact  night-coverage [email or web address] Password [facility] 08/31/2018, 7:54 AM   LOS: 0 days

## 2018-08-31 NOTE — ED Provider Notes (Signed)
[facility] EMERGENCY DEPARTMENT Provider Note   CSN: [id number] Arrival date & time: 08/31/18  0102  Time seen 1:07 AM  History   Chief Complaint Chief Complaint  Patient presents with  . Shortness of Breath    HPI [name] is a 53 y.o. male.     HPI patient has a history of congestive heart failure and chronic atrial fibrillation with frequent ED visits for fluid overload.  He states 2 days ago he started getting short of breath and having swelling of his legs and his abdomen.  He states he had "a little" chest pain in the center of his chest while he was in the ambulance that he describes as dull.  He states he has had a little dry cough.  He denies sore throat but has had mild rhinorrhea that is clear.  He states he is taking his medications.  He is on oxygen at home at 3 L/min nasal cannula.  He states today he ate hotdogs that were boiled, fried fish from a fast food restaurant, [place], hush puppies and slaw.  PCP Pearson Grippe, MD Cardiology Dr Darl Householder  Past Medical History:  Diagnosis Date  . Allergic rhinitis   . Asthma   . Atrial fibrillation (HCC)   . CHF (congestive heart failure) (HCC)   . Essential hypertension   . Gastroesophageal reflux disease   . Heart attack (HCC)   . History of cardiac catheterization    Normal coronaries December 2016  . Noncompliance    Major problem leading to declining health and recurrent hospitalization  . Nonischemic cardiomyopathy (HCC)    LVEF 20-25%  . On home O2    3L N/C  . OSA (obstructive sleep apnea)   . Osteoarthritis   . Peptic ulcer disease     Patient Active Problem List   Diagnosis Date Noted  . Acute on chronic combined systolic and diastolic congestive heart failure (HCC) 08/15/2018  . SOB (shortness of breath) 08/03/2018  . Volume overload 07/22/2018  . Acute systolic CHF (congestive heart failure) (HCC) 07/04/2018  . Acute on chronic combined systolic and diastolic heart failure (HCC) 04/01/2018  .  Hypomagnesemia 04/01/2018  . Acute on chronic diastolic CHF (congestive heart failure) (HCC) 03/11/2018  . Pressure injury of skin 03/11/2018  . CHF (congestive heart failure) (HCC) 09/08/2017  . Altered mental status 05/11/2017  . Acute exacerbation of CHF (congestive heart failure) (HCC) 04/20/2017  . Acute on chronic systolic (congestive) heart failure (HCC) 04/19/2017  . CHF exacerbation (HCC) 04/11/2017  . Chronic respiratory failure with hypoxia (HCC) 04/11/2017  . Encounter for hospice care discussion   . Palliative care encounter   . Goals of care, counseling/discussion   . DNR (do not resuscitate)   . DNR (do not resuscitate) discussion   . Chronic congestive heart failure (HCC) 02/18/2017  . Acute systolic heart failure (HCC) 01/18/2017  . Pedal edema 12/02/2016  . Acute CHF (congestive heart failure) (HCC) 11/09/2016  . Acute on chronic systolic CHF (congestive heart failure) (HCC) 08/19/2016  . CKD (chronic kidney disease), stage II 05/17/2016  . Coronary arteries, normal Dec 2016 01/23/2016  . Chronic anticoagulation-Xarelto 01/23/2016  . NSVT (nonsustained ventricular tachycardia) (HCC) 01/23/2016  . Elevated troponin 12/21/2015  . Nonischemic cardiomyopathy (HCC)   . Morbid obesity due to excess calories (HCC)   . Noncompliance with diet and medication regimen 12/02/2015  . OSA (obstructive sleep apnea) 09/20/2015  . Pulmonary hypertension (HCC) 09/19/2015  . Normocytic anemia 09/19/2015  .  Atrial fibrillation, chronic   . Dyspnea on exertion 05/28/2015  . Acute respiratory failure with hypoxia (HCC) 04/01/2015  . Hypokalemia 04/01/2015  . Obesity hypoventilation syndrome (HCC) 08/08/2014  . GERD (gastroesophageal reflux disease) 08/08/2014  . Edema of right lower extremity 06/21/2014  . Fecal urgency 06/21/2014  . Asthma 02/11/2009  . Hyperlipidemia 07/12/2008  . OBESITY, MORBID 07/12/2008  . Anxiety state 07/12/2008  . DEPRESSION 07/12/2008  . Essential  hypertension 07/12/2008  . ALLERGIC RHINITIS 07/12/2008  . Peptic ulcer 07/12/2008  . OSTEOARTHRITIS 07/12/2008    Past Surgical History:  Procedure Laterality Date  . CARDIAC CATHETERIZATION N/A 06/14/2015   Procedure: Right/Left Heart Cath and Coronary Angiography;  Surgeon: Runell Gess, MD;  Location: [facility] INVASIVE CV LAB;  Service: Cardiovascular;  Laterality: N/A;        Home Medications    Prior to Admission medications   Medication Sig Start Date End Date Taking? Authorizing Provider  albuterol (PROVENTIL) (2.5 MG/3ML) 0.083% nebulizer solution Take 2.5 mg by nebulization every 6 (six) hours as needed for wheezing or shortness of breath.    [provider]  atorvastatin (LIPITOR) 20 MG tablet Take 1 tablet (20 mg total) by mouth daily at 6 PM for 30 days. 08/18/18 09/17/18  Sherryll Burger, Pratik D, DO  beclomethasone (QVAR) 40 MCG/ACT inhaler Inhale 2 puffs into the lungs daily as needed (for shortness of breath).    [provider]  bumetanide (BUMEX) 2 MG tablet Take 2 mg by mouth 3 (three) times daily.    [provider]  carvedilol (COREG) 25 MG tablet Take 1 tablet (25 mg total) by mouth 2 (two) times daily with a meal for 30 days. 08/18/18 09/17/18  Sherryll Burger, Pratik D, DO  docusate sodium (COLACE) 100 MG capsule Take 1 capsule (100 mg total) by mouth 2 (two) times daily. 06/25/16   Kathlen Mody, MD  ferrous gluconate (FERGON) 324 MG tablet Take 1 tablet (324 mg total) by mouth 2 (two) times daily with a meal. 06/25/16   Kathlen Mody, MD  fluticasone (FLOVENT HFA) 220 MCG/ACT inhaler Inhale 2 puffs into the lungs 2 (two) times daily as needed (for shortness of breath).     [provider]  lisinopril (PRINIVIL,ZESTRIL) 2.5 MG tablet Take 1 tablet (2.5 mg total) by mouth daily for 30 days. 08/19/18 09/18/18  Sherryll Burger, Pratik D, DO  OXYGEN Inhale 3.5 L into the lungs daily.    [provider]  pantoprazole (PROTONIX) 40 MG tablet Take 1 tablet (40 mg  total) by mouth daily at 6 (six) AM. Patient taking differently: Take 40 mg by mouth every evening.  06/26/16   Kathlen Mody, MD  potassium chloride SA (K-DUR,KLOR-CON) 20 MEQ tablet Take 1 tablet (20 mEq total) by mouth 2 (two) times daily. 06/13/18   Sherryll Burger, Pratik D, DO  rivaroxaban (XARELTO) 20 MG TABS tablet Take 1 tablet (20 mg total) by mouth daily with breakfast. Patient taking differently: Take 20 mg by mouth daily with supper.  06/26/16   Kathlen Mody, MD  spironolactone (ALDACTONE) 100 MG tablet Take 100 mg by mouth daily. For fluid 03/18/17   [provider]  tamsulosin (FLOMAX) 0.4 MG CAPS capsule Take 1 capsule (0.4 mg total) by mouth daily. 06/25/16   Kathlen Mody, MD  potassium chloride 20 MEQ TBCR Take 20 mEq by mouth 2 (two) times daily. 09/03/15 09/03/15  Ward, Layla Maw, DO    Family History Family History  Problem Relation Age of Onset  .  Stroke Father   . Heart attack Father   . Aneurysm Mother        Cerebral aneurysm  . Hypertension Sister   . Colon cancer Neg Hx   . Inflammatory bowel disease Neg Hx   . Liver disease Neg Hx     Social History Social History   Tobacco Use  . Smoking status: Former Smoker    Packs/day: 0.50    Years: 20.00    Pack years: 10.00    Types: Cigarettes    Start date: 04/26/1988    Last attempt to quit: 06/23/2007    Years since quitting: 11.1  . Smokeless tobacco: Never Used  . Tobacco comment: 1 ppd former smoker  Substance Use Topics  . Alcohol use: No    Alcohol/week: 0.0 standard drinks    Comment: No etoh since 2009  . Drug use: No  lives at home Lives with mother   Allergies   Banana; Bee venom; Food; Aspirin; Metolazone; Oatmeal; Orange juice [orange oil]; Torsemide; Diltiazem; Hydralazine; and Lipitor [atorvastatin]   Review of Systems Review of Systems  All other systems reviewed and are negative.    Physical Exam Updated Vital Signs BP (!) 141/84   Pulse (!) 53   Temp 98.8 F (37.1 C) (Oral)    Resp (!) 24   SpO2 100%   Physical Exam Vitals signs and nursing note reviewed.  Constitutional:      Appearance: He is well-developed. He is obese.  HENT:     Head: Normocephalic and atraumatic.     Right Ear: External ear normal.     Left Ear: External ear normal.     Nose: Nose normal.     Mouth/Throat:     Mouth: Mucous membranes are moist.  Eyes:     Extraocular Movements: Extraocular movements intact.     Conjunctiva/sclera: Conjunctivae normal.     Pupils: Pupils are equal, round, and reactive to light.  Neck:     Musculoskeletal: Normal range of motion.  Cardiovascular:     Rate and Rhythm: Normal rate and regular rhythm.     Pulses: Normal pulses.  Pulmonary:     Effort: Tachypnea and prolonged expiration present.     Breath sounds: Decreased breath sounds and wheezing present.  Abdominal:     General: There is distension.     Tenderness: There is no abdominal tenderness.  Musculoskeletal:        General: Swelling present.  Skin:    General: Skin is warm and dry.     Findings: No erythema.  Neurological:     General: No focal deficit present.     Mental Status: He is oriented to person, place, and time. Mental status is at baseline.  Psychiatric:        Mood and Affect: Mood normal.        Behavior: Behavior normal.        Thought Content: Thought content normal.      ED Treatments / Results  Labs (all labs ordered are listed, but only abnormal results are displayed) Results for orders placed or performed during the hospital encounter of 08/31/18  Comprehensive metabolic panel  Result Value Ref Range   Sodium 139 135 - 145 mmol/L   Potassium 3.4 (L) 3.5 - 5.1 mmol/L   Chloride 109 98 - 111 mmol/L   CO2 24 22 - 32 mmol/L   Glucose, Bld 97 70 - 99 mg/dL   BUN 13 6 - 20 mg/dL  Creatinine, Ser 1.00 0.61 - 1.24 mg/dL   Calcium 8.1 (L) 8.9 - 10.3 mg/dL   Total Protein 6.4 (L) 6.5 - 8.1 g/dL   Albumin 3.5 3.5 - 5.0 g/dL   AST 19 15 - 41 U/L   ALT 15 0  - 44 U/L   Alkaline Phosphatase 61 38 - 126 U/L   Total Bilirubin 0.8 0.3 - 1.2 mg/dL   GFR calc non Af Amer >60 >60 mL/min   GFR calc Af Amer >60 >60 mL/min   Anion gap 6 5 - 15  Troponin I - Once  Result Value Ref Range   Troponin I 0.03 (HH) <0.03 ng/mL  Brain natriuretic peptide  Result Value Ref Range   B Natriuretic Peptide 341.0 (H) 0.0 - 100.0 pg/mL  CBC with Differential  Result Value Ref Range   WBC 4.3 4.0 - 10.5 K/uL   RBC 4.18 (L) 4.22 - 5.81 MIL/uL   Hemoglobin 11.8 (L) 13.0 - 17.0 g/dL   HCT 16.1 (L) 09.6 - 04.5 %   MCV 89.2 80.0 - 100.0 fL   MCH 28.2 26.0 - 34.0 pg   MCHC 31.6 30.0 - 36.0 g/dL   RDW 40.9 81.1 - 91.4 %   Platelets 171 150 - 400 K/uL   nRBC 0.0 0.0 - 0.2 %   Neutrophils Relative % 67 %   Neutro Abs 2.9 1.7 - 7.7 K/uL   Lymphocytes Relative 21 %   Lymphs Abs 0.9 0.7 - 4.0 K/uL   Monocytes Relative 10 %   Monocytes Absolute 0.4 0.1 - 1.0 K/uL   Eosinophils Relative 2 %   Eosinophils Absolute 0.1 0.0 - 0.5 K/uL   Basophils Relative 0 %   Basophils Absolute 0.0 0.0 - 0.1 K/uL   Immature Granulocytes 0 %   Abs Immature Granulocytes 0.01 0.00 - 0.07 K/uL  Blood gas, venous  Result Value Ref Range   FIO2 32.00    O2 Content 100.0 L/min   pH, Ven 7.359 7.250 - 7.430   pCO2, Ven 47.6 44.0 - 60.0 mmHg   pO2, Ven 43.2 32.0 - 45.0 mmHg   Bicarbonate 24.6 20.0 - 28.0 mmol/L   Acid-Base Excess 1.3 0.0 - 2.0 mmol/L   O2 Saturation 71.9 %   Patient temperature 37.0    Laboratory interpretation all normal except stable mild anemia, mild hypokalemia, stable mild elevation of BNP, borderline positive troponin which she has had in the past.  VBG is normal    EKG EKG Interpretation  Date/Time:  Wednesday August 31 2018 01:02:56 EDT Ventricular Rate:  98 PR Interval:    QRS Duration: 86 QT Interval:  376 QTC Calculation: 481 R Axis:   62 Text Interpretation:  Atrial fibrillation Multiple ventricular premature complexes Nonspecific T abnormalities,  lateral leads Borderline prolonged QT interval No significant change since last tracing 15 Aug 2018 Confirmed by Devoria Albe ([id number]) on 08/31/2018 1:05:43 AM   Radiology Dg Chest Port 1 View  Result Date: 08/31/2018 CLINICAL DATA:  Shortness of breath and lower leg swelling for 2 days. Worsening. History of CHF, hypertension, asthma, atrial fibrillation, former smoker. EXAM: PORTABLE CHEST 1 VIEW COMPARISON:  08/15/2018 FINDINGS: Cardiac enlargement with pulmonary vascular congestion. Perihilar infiltration suggesting edema. No blunting of costophrenic angles. No pneumothorax. Mediastinal contours appear intact. IMPRESSION: Cardiac enlargement with pulmonary vascular congestion and perihilar edema. Electronically Signed   By: Burman Nieves M.D.   On: 08/31/2018 01:41    Procedures .Critical Care Performed by: Devoria Albe, MD  Authorized by: Devoria Albe, MD   Critical care provider statement:    Critical care time (minutes):  31   Critical care was necessary to treat or prevent imminent or life-threatening deterioration of the following conditions:  Cardiac failure and respiratory failure   Critical care was time spent personally by me on the following activities:  Discussions with primary provider, examination of patient, obtaining history from patient or surrogate, ordering and review of laboratory studies, ordering and review of radiographic studies, pulse oximetry, re-evaluation of patient's condition and review of old charts   (including critical care time)  Medications Ordered in ED Medications  potassium chloride SA (K-DUR,KLOR-CON) CR tablet 40 mEq (has no administration in time range)  nitroGLYCERIN (NITROGLYN) 2 % ointment 1 inch (1 inch Topical Given 08/31/18 0226)  furosemide (LASIX) injection 80 mg (80 mg Intravenous Given 08/31/18 0226)  albuterol (PROVENTIL,VENTOLIN) solution continuous neb (10 mg/hr Nebulization Given 08/31/18 0154)     Initial Impression / Assessment and Plan  / ED Course  I have reviewed the triage vital signs and the nursing notes.  Pertinent labs & imaging results that were available during my care of the patient were reviewed by me and considered in my medical decision making (see chart for details).        Patient was given nitroglycerin paste and Lasix IV.  He was given an albuterol continuous nebulizer.  Laboratory testing and chest x-ray were ordered.  Recheck at 3 AM patient is sitting on the side of the bed.  He has a urinal with 700 cc of urine in it, he states that is his first urinal.  He states his breathing is getting better although he still appears to have some shortness of breath.  After reviewing patient's labs he was given oral potassium for his mild hypokalemia.  Recheck at 4:20 AM patient has had 1800 cc of urinary output.  He states he is breathing better.  However he states his legs still feel tight and he feels like he is having leg cramps.  He feels like he needs to be admitted.  I will talk to the hospitalist.  4:44 AM Dr. Sharl Ma hospitalist will admit  Final Clinical Impressions(s) / ED Diagnoses   Final diagnoses:  Acute on chronic congestive heart failure, unspecified heart failure type [facility])    Plan admission  Devoria Albe, MD, Concha Pyo, MD 08/31/18 [phone]

## 2018-08-31 NOTE — TOC Initial Note (Signed)
Transition of Care [facility]) - Initial/Assessment Note    Patient Details  Name: [name] MRN: [id number] Date of Birth: Sep 10, 1965  [facility]) CM/SW Contact:    Elliot Gault, LCSW Phone Number: 08/31/2018, 11:25 AM  Clinical Narrative: Notified [facility] RN of pt's admission as they are working with pt at home.                 Expected Discharge Plan: Home/Self Care Barriers to Discharge: Continued Medical Work up   Patient Goals and CMS Choice        Expected Discharge Plan and Services Expected Discharge Plan: Home/Self Care     Living arrangements for the past 2 months: Single Family Home                          Prior Living Arrangements/Services Living arrangements for the past 2 months: Single Family Home Lives with:: Parents Patient language and need for interpreter reviewed:: Yes Do you feel safe going back to the place where you live?: Yes      Need for Family Participation in Patient Care: No (Comment) Care giver support system in place?: Yes (comment) Current home services: Other (comment)([facility] ) Criminal Activity/Legal Involvement Pertinent to Current Situation/Hospitalization: No - Comment as needed  Activities of Daily Living Home Assistive Devices/Equipment: None ADL Screening (condition at time of admission) Patient's cognitive ability adequate to safely complete daily activities?: Yes Is the patient deaf or have difficulty hearing?: No Does the patient have difficulty seeing, even when wearing glasses/contacts?: No Does the patient have difficulty concentrating, remembering, or making decisions?: No Patient able to express need for assistance with ADLs?: Yes Does the patient have difficulty dressing or bathing?: No Independently performs ADLs?: Yes (appropriate for developmental age) Does the patient have difficulty walking or climbing stairs?: Yes Weakness of Legs: Both Weakness of Arms/Hands: None  Permission Sought/Granted                   Emotional Assessment Appearance:: Appears stated age Attitude/Demeanor/Rapport: Lethargic Affect (typically observed): Calm Orientation: : Oriented to Self, Oriented to Place, Oriented to  Time, Oriented to Situation Alcohol / Substance Use: Not Applicable Psych Involvement: No (comment)  Admission diagnosis:  Acute on chronic congestive heart failure, unspecified heart failure type [facility]) [I50.9] Patient Active Problem List   Diagnosis Date Noted  . Acute on chronic combined systolic and diastolic congestive heart failure (HCC) 08/15/2018  . SOB (shortness of breath) 08/03/2018  . Volume overload 07/22/2018  . Acute systolic CHF (congestive heart failure) (HCC) 07/04/2018  . Acute on chronic combined systolic and diastolic heart failure (HCC) 04/01/2018  . Hypomagnesemia 04/01/2018  . Acute on chronic diastolic CHF (congestive heart failure) (HCC) 03/11/2018  . Pressure injury of skin 03/11/2018  . CHF (congestive heart failure) (HCC) 09/08/2017  . Altered mental status 05/11/2017  . Acute exacerbation of CHF (congestive heart failure) (HCC) 04/20/2017  . Acute on chronic systolic (congestive) heart failure (HCC) 04/19/2017  . CHF exacerbation (HCC) 04/11/2017  . Chronic respiratory failure with hypoxia (HCC) 04/11/2017  . Encounter for hospice care discussion   . Palliative care encounter   . Goals of care, counseling/discussion   . DNR (do not resuscitate)   . DNR (do not resuscitate) discussion   . Chronic congestive heart failure (HCC) 02/18/2017  . Acute systolic heart failure (HCC) 01/18/2017  . Pedal edema 12/02/2016  . Acute CHF (congestive heart failure) (HCC) 11/09/2016  .  Acute on chronic systolic CHF (congestive heart failure) (HCC) 08/19/2016  . CKD (chronic kidney disease), stage II 05/17/2016  . Coronary arteries, normal Dec 2016 01/23/2016  . Chronic anticoagulation-Xarelto 01/23/2016  . NSVT (nonsustained ventricular tachycardia) (HCC)  01/23/2016  . Elevated troponin 12/21/2015  . Nonischemic cardiomyopathy (HCC)   . Morbid obesity due to excess calories (HCC)   . Noncompliance with diet and medication regimen 12/02/2015  . OSA (obstructive sleep apnea) 09/20/2015  . Pulmonary hypertension (HCC) 09/19/2015  . Normocytic anemia 09/19/2015  . Atrial fibrillation, chronic   . Dyspnea on exertion 05/28/2015  . Acute respiratory failure with hypoxia (HCC) 04/01/2015  . Hypokalemia 04/01/2015  . Obesity hypoventilation syndrome (HCC) 08/08/2014  . GERD (gastroesophageal reflux disease) 08/08/2014  . Edema of right lower extremity 06/21/2014  . Fecal urgency 06/21/2014  . Asthma 02/11/2009  . Hyperlipidemia 07/12/2008  . OBESITY, MORBID 07/12/2008  . Anxiety state 07/12/2008  . DEPRESSION 07/12/2008  . Essential hypertension 07/12/2008  . ALLERGIC RHINITIS 07/12/2008  . Peptic ulcer 07/12/2008  . OSTEOARTHRITIS 07/12/2008   PCP:  Pearson Grippe, MD Pharmacy:   [facility] [phone] - [place], [place] - [address] [place] [address] Phone: [phone] Fax: [phone]     Social Determinants of Health (SDOH) Interventions    Readmission Risk Interventions 30 Day Unplanned Readmission Risk Score     ED to Hosp-Admission (Current) from 08/31/2018 in [facility] INTENSIVE CARE UNIT  30 Day Unplanned Readmission Risk Score (%)  41 Filed at 08/31/2018 0801     This score is the patient's risk of an unplanned readmission within 30 days of being discharged (0 -100%). The score is based on dignosis, age, lab data, medications, orders, and past utilization.   Low:  0-14.9   Medium: 15-21.9   High: 22-29.9   Extreme: 30 and above       No flowsheet data found.

## 2018-09-01 DIAGNOSIS — N182 Chronic kidney disease, stage 2 (mild): Secondary | ICD-10-CM

## 2018-09-01 DIAGNOSIS — I482 Chronic atrial fibrillation, unspecified: Secondary | ICD-10-CM

## 2018-09-01 DIAGNOSIS — E662 Morbid (severe) obesity with alveolar hypoventilation: Secondary | ICD-10-CM

## 2018-09-01 LAB — CBC
HCT: 38.8 % — ABNORMAL LOW (ref 39.0–52.0)
Hemoglobin: 12.1 g/dL — ABNORMAL LOW (ref 13.0–17.0)
MCH: 27.9 pg (ref 26.0–34.0)
MCHC: 31.2 g/dL (ref 30.0–36.0)
MCV: 89.6 fL (ref 80.0–100.0)
PLATELETS: 179 10*3/uL (ref 150–400)
RBC: 4.33 MIL/uL (ref 4.22–5.81)
RDW: 14 % (ref 11.5–15.5)
WBC: 4 10*3/uL (ref 4.0–10.5)
nRBC: 0 % (ref 0.0–0.2)

## 2018-09-01 LAB — COMPREHENSIVE METABOLIC PANEL
ALBUMIN: 3.5 g/dL (ref 3.5–5.0)
ALT: 14 U/L (ref 0–44)
AST: 15 U/L (ref 15–41)
Alkaline Phosphatase: 55 U/L (ref 38–126)
Anion gap: 6 (ref 5–15)
BUN: 15 mg/dL (ref 6–20)
CHLORIDE: 108 mmol/L (ref 98–111)
CO2: 28 mmol/L (ref 22–32)
Calcium: 8.8 mg/dL — ABNORMAL LOW (ref 8.9–10.3)
Creatinine, Ser: 1.05 mg/dL (ref 0.61–1.24)
GFR calc Af Amer: >60 mL/min (ref >60)
GFR calc non Af Amer: >60 mL/min (ref >60)
Glucose, Bld: 90 mg/dL (ref 70–99)
Potassium: 3.6 mmol/L (ref 3.5–5.1)
SODIUM: 142 mmol/L (ref 135–145)
TOTAL PROTEIN: 6.5 g/dL (ref 6.5–8.1)
Total Bilirubin: 1.5 mg/dL — ABNORMAL HIGH (ref 0.3–1.2)

## 2018-09-01 MED ORDER — LISINOPRIL 5 MG PO TABS
5.0000 mg | ORAL_TABLET | Freq: Every day | ORAL | Status: DC
  Administered 2018-09-01 – 2018-09-02 (×2): 5 mg via ORAL
  Filled 2018-09-01 (×2): qty 1

## 2018-09-01 NOTE — Progress Notes (Signed)
PROGRESS NOTE  [name] VZS:[id number] DOB: 1966-03-23 DOA: 08/31/2018 PCP: Pearson Grippe, MD  Brief History:  53 y/o male with with medical history of history significant of sCHF (numerous admissions for uncompensated CHF but patient continues to be noncompliant with diet and medications outpatient and has been fired from numerous cardiology clinics secondary to no showing for appointments), atrial fibrillation (on xarelto), HTN, MI, asthma, OSA (no on CPAP or BiPap) presents with progressive leg swelling and sob since d/c from his last admission 08/15/18-08/18/18. The patient presents again with 2 days of shortness of breath and worsening lower extremity edema as well as increasing abdominal girth.  He denies any fevers, chills, headache, nausea, vomiting, diarrhea.  He has a nonproductive cough.  He is chronically on 3 L nasal cannula.  He endorses compliance with his medications, but he has had a documented outpatient visit with our clinical pharmacist who documented that the patient has not had his Bumex filled since July 2019.  Xarelto last refilled on 09/2017 and spiro last filled 03/2018.  In addition, the patient has had indiscretion with his dietary intake.  On the day of admission, the patient ate a few hotdogs, fried fish, and hash puppies.  Assessment/Plan: Acute on chronic systolic and diastolic CHF -Hispriordry weight 312 lbs on 02/08/17 -2/27 discharge weight 364 lbs -2/28 office weight 371 lbs -NEG 8.8 lbs in last 24 hours - continuelasix to 60mg  IV BID-->>8L urine output last 24 hr -5/22/18Echo--EF 35-40 percent, diffuse HK, PSP 56, mild MR, +PFO -04/01/18 Echo EF 55%, no WMA - fluid restriction -reset dry weight--likely be weighing heavier now without fluid retention due to eating habits and sedentary lifestyle. - pt states he eats "french fries" regularly -continue spiro, coreg, IV lasix -remains clinically fluid overloaded  Permanent atrial  fibrillation - Continue xarelto - continuecoreg  Chronic respiratory Failure with hypoxia -chronically on 3L at home  HTN - continue coreg, spiro, lasix -improving with diuresis -lisinopril 5 mg added  GERD - continue protoninx  Hyperlipidemia -continue statin  Chronic lymphedema -pt noncompliant with therapies  CKD stage 2 -baseline creatinine 1.0-1.3 -monitor with diuresis  OSA -noncompliant with CPAP  Hypokalemia -replete -check mag  Morbid Obesity -BMI 52.06 -lifestyle modification    Disposition Plan:   Home in 2-3 days  Family Communication:  No Family at bedside  Consultants:  cardiology  Code Status:  FULL  DVT Prophylaxis:  Xarelto   Procedures: As Listed in Progress Note Above      Subjective: Patient denies fevers, chills, headache, chest pain, dyspnea, nausea, vomiting, diarrhea, abdominal pain, dysuria, hematuria, hematochezia, and melena.   Objective: Vitals:   09/01/18 1200 09/01/18 1300 09/01/18 1400 09/01/18 1702  BP: (!) 151/94  133/66   Pulse: (!) 42 77 88   Resp: (!) 25 18 (!) 28   Temp:    97.9 F (36.6 C)  TempSrc:    Oral  SpO2: 96% 91% 98%   Weight:      Height:        Intake/Output Summary (Last 24 hours) at 09/01/2018 1859 Last data filed at 09/01/2018 1312 Gross per 24 hour  Intake -  Output 2900 ml  Net -2900 ml   Weight change: -4 kg Exam:   General:  Pt is alert, follows commands appropriately, not in acute distress  HEENT: No icterus, No thrush, No neck mass, Theodore/AT  Cardiovascular: RRR, S1/S2, no rubs, no gallops  Respiratory:  bibasilar crackles  Abdomen: Soft/+BS, non tender, non distended, no guarding  Extremities: 2 + LE edema, No lymphangitis, No petechiae, No rashes, no synovitis   Data Reviewed: I have personally reviewed following labs and imaging studies Basic Metabolic Panel: Recent Labs  Lab 08/31/18 0134 09/01/18 0506  NA 139 142  K 3.4* 3.6  CL 109 108   CO2 24 28  GLUCOSE 97 90  BUN 13 15  CREATININE 1.00 1.05  CALCIUM 8.1* 8.8*   Liver Function Tests: Recent Labs  Lab 08/31/18 0134 09/01/18 0506  AST 19 15  ALT 15 14  ALKPHOS 61 55  BILITOT 0.8 1.5*  PROT 6.4* 6.5  ALBUMIN 3.5 3.5   No results for input(s): LIPASE, AMYLASE in the last 168 hours. No results for input(s): AMMONIA in the last 168 hours. Coagulation Profile: No results for input(s): INR, PROTIME in the last 168 hours. CBC: Recent Labs  Lab 08/31/18 0134 09/01/18 0506  WBC 4.3 4.0  NEUTROABS 2.9  --   HGB 11.8* 12.1*  HCT 37.3* 38.8*  MCV 89.2 89.6  PLT 171 179   Cardiac Enzymes: Recent Labs  Lab 08/31/18 0134  TROPONINI 0.03*   BNP: Invalid input(s): POCBNP CBG: No results for input(s): GLUCAP in the last 168 hours. HbA1C: No results for input(s): HGBA1C in the last 72 hours. Urine analysis:    Component Value Date/Time   COLORURINE YELLOW 04/01/2018 0332   APPEARANCEUR CLEAR 04/01/2018 0332   LABSPEC 1.008 04/01/2018 0332   PHURINE 7.0 04/01/2018 0332   GLUCOSEU NEGATIVE 04/01/2018 0332   GLUCOSEU NEG mg/dL 86/76/7209 4709   HGBUR NEGATIVE 04/01/2018 0332   BILIRUBINUR NEGATIVE 04/01/2018 0332   KETONESUR NEGATIVE 04/01/2018 0332   PROTEINUR NEGATIVE 04/01/2018 0332   UROBILINOGEN 0.2 08/08/2014 1445   NITRITE NEGATIVE 04/01/2018 0332   LEUKOCYTESUR NEGATIVE 04/01/2018 0332   Sepsis Labs: @LABRCNTIP (procalcitonin:4,lacticidven:4) ) Recent Results (from the past 240 hour(s))  MRSA PCR Screening     Status: None   Collection Time: 08/31/18  6:26 AM  Result Value Ref Range Status   MRSA by PCR NEGATIVE NEGATIVE Final    Comment:        The GeneXpert MRSA Assay (FDA approved for NASAL specimens only), is one component of a comprehensive MRSA colonization surveillance program. It is not intended to diagnose MRSA infection nor to guide or monitor treatment for MRSA infections. Performed at [facility], [address],  [place], [place] [address]      Scheduled Meds: . atorvastatin  20 mg Oral q1800  . budesonide  0.25 mg Inhalation BID  . carvedilol  25 mg Oral BID WC  . docusate sodium  100 mg Oral BID  . ferrous gluconate  324 mg Oral BID WC  . furosemide  60 mg Intravenous BID  . lisinopril  5 mg Oral Daily  . mouth rinse  15 mL Mouth Rinse BID  . pantoprazole  40 mg Oral QPM  . potassium chloride SA  20 mEq Oral BID  . rivaroxaban  20 mg Oral Q supper  . sodium chloride flush  3 mL Intravenous Q12H  . spironolactone  100 mg Oral Daily  . tamsulosin  0.4 mg Oral Daily   Continuous Infusions: . sodium chloride      Procedures/Studies: Dg Chest 2 View  Result Date: 08/15/2018 CLINICAL DATA:  53 y/o M; shortness of breath, history of heart failure. EXAM: CHEST - 2 VIEW COMPARISON:  08/03/2018 chest radiograph FINDINGS: Stable cardiomegaly given  projection and technique. Diffuse reticular and hazy basilar opacities of the lungs. Possible small right effusion. No pneumothorax. No acute osseous abnormality is evident. IMPRESSION: Cardiomegaly, interstitial pulmonary edema, and mild alveolar edema. Possible small right effusion. Electronically Signed   By: Mitzi Hansen M.D.   On: 08/15/2018 02:01   Dg Chest Port 1 View  Result Date: 08/31/2018 CLINICAL DATA:  Shortness of breath and lower leg swelling for 2 days. Worsening. History of CHF, hypertension, asthma, atrial fibrillation, former smoker. EXAM: PORTABLE CHEST 1 VIEW COMPARISON:  08/15/2018 FINDINGS: Cardiac enlargement with pulmonary vascular congestion. Perihilar infiltration suggesting edema. No blunting of costophrenic angles. No pneumothorax. Mediastinal contours appear intact. IMPRESSION: Cardiac enlargement with pulmonary vascular congestion and perihilar edema. Electronically Signed   By: Burman Nieves M.D.   On: 08/31/2018 01:41   Dg Chest Portable 1 View  Result Date: 08/03/2018 CLINICAL DATA:  53 year old male with a  history of leg edema shortness of breath EXAM: PORTABLE CHEST 1 VIEW COMPARISON:  07/25/2018, 07/22/2018, 07/21/2018 FINDINGS: Cardiomediastinal silhouette unchanged with cardiomegaly. Fullness in the central vasculature with interlobular septal thickening. No pneumothorax. No large pleural effusion. No displaced fracture. IMPRESSION: Cardiomegaly with evidence of mild pulmonary edema. Atypical infection not excluded. Electronically Signed   By: Gilmer Mor D.O.   On: 08/03/2018 19:03    Catarina Hartshorn, DO  [facility] Pager [phone]  If 7PM-7AM, please contact night-coverage [email or web address] Password [email or web address] 09/01/2018, 6:59 PM   LOS: 1 day

## 2018-09-01 NOTE — Progress Notes (Addendum)
Progress Note  Patient Name: [name] Date of Encounter: 09/01/2018  Primary Cardiologist: Prentice Docker, MD   Subjective   Says that his breathing has significantly improved since admission. Also noticed improvement in his lower extremity edema. No chest pain or palpitations.   Inpatient Medications    Scheduled Meds:  atorvastatin  20 mg Oral q1800   budesonide  0.25 mg Inhalation BID   carvedilol  25 mg Oral BID WC   docusate sodium  100 mg Oral BID   ferrous gluconate  324 mg Oral BID WC   furosemide  60 mg Intravenous BID   lisinopril  2.5 mg Oral Daily   mouth rinse  15 mL Mouth Rinse BID   pantoprazole  40 mg Oral QPM   potassium chloride SA  20 mEq Oral BID   rivaroxaban  20 mg Oral Q supper   sodium chloride flush  3 mL Intravenous Q12H   spironolactone  100 mg Oral Daily   tamsulosin  0.4 mg Oral Daily   Continuous Infusions:  sodium chloride     PRN Meds: sodium chloride, acetaminophen **OR** acetaminophen, albuterol, sodium chloride flush   Vital Signs    Vitals:   09/01/18 0000 09/01/18 0200 09/01/18 0400 09/01/18 0600  BP: 127/82 (!) 127/91 116/76 (!) 147/104  Pulse: 81 90 61 81  Resp: 19 (!) 27  (!) 26  Temp:    99.6 F (37.6 C)  TempSrc:    Oral  SpO2: 93% 100% 95% 96%  Weight:    (!) 170.1 kg  Height:        Intake/Output Summary (Last 24 hours) at 09/01/2018 0753 Last data filed at 09/01/2018 0415 Gross per 24 hour  Intake 410 ml  Output 5100 ml  Net -4690 ml    Last 3 Weights 09/01/2018 08/31/2018 08/19/2018  Weight (lbs) 375 lb 383 lb 13.1 oz 371 lb  Weight (kg) 170.1 kg 174.1 kg 168.284 kg      Telemetry    Atrial fibrillation, HR in 60's to 80's with occasional pauses up to 2.2 seconds. Frequent PVC's and occasional couplets. Longest being 4 beats run of NSVT  - Personally Reviewed  ECG    No new tracings.   Physical Exam   General: Well developed, well nourished obese African American male appearing  in no acute distress. Head: Normocephalic, atraumatic.  Neck: Supple without bruits, JVD at 8cm. Lungs:  Resp regular and unlabored, decreased along bases bilaterally. Heart: Irregularly irregular, S1, S2, no S3, S4, or murmur; no rub. Abdomen: Soft, non-tender, non-distended with normoactive bowel sounds. No hepatomegaly. No rebound/guarding. No obvious abdominal masses. Extremities: No clubbing or cyanosis, 1+ chronic edema bilaterally. Distal pedal pulses are 2+ bilaterally. Neuro: Alert and oriented X 3. Moves all extremities spontaneously. Psych: Normal affect.  Labs    Chemistry Recent Labs  Lab 08/31/18 0134 09/01/18 0506  NA 139 142  K 3.4* 3.6  CL 109 108  CO2 24 28  GLUCOSE 97 90  BUN 13 15  CREATININE 1.00 1.05  CALCIUM 8.1* 8.8*  PROT 6.4* 6.5  ALBUMIN 3.5 3.5  AST 19 15  ALT 15 14  ALKPHOS 61 55  BILITOT 0.8 1.5*  GFRNONAA >60 >60  GFRAA >60 >60  ANIONGAP 6 6     Hematology Recent Labs  Lab 08/31/18 0134 09/01/18 0506  WBC 4.3 4.0  RBC 4.18* 4.33  HGB 11.8* 12.1*  HCT 37.3* 38.8*  MCV 89.2 89.6  MCH 28.2 27.9  MCHC 31.6 31.2  RDW 14.0 14.0  PLT 171 179    Cardiac Enzymes Recent Labs  Lab 08/31/18 0134  TROPONINI 0.03*   No results for input(s): TROPIPOC in the last 168 hours.   BNP Recent Labs  Lab 08/31/18 0134  BNP 341.0*     DDimer No results for input(s): DDIMER in the last 168 hours.   Radiology    Dg Chest Port 1 View  Result Date: 08/31/2018 CLINICAL DATA:  Shortness of breath and lower leg swelling for 2 days. Worsening. History of CHF, hypertension, asthma, atrial fibrillation, former smoker. EXAM: PORTABLE CHEST 1 VIEW COMPARISON:  08/15/2018 FINDINGS: Cardiac enlargement with pulmonary vascular congestion. Perihilar infiltration suggesting edema. No blunting of costophrenic angles. No pneumothorax. Mediastinal contours appear intact. IMPRESSION: Cardiac enlargement with pulmonary vascular congestion and perihilar edema.  Electronically Signed   By: Burman Nieves M.D.   On: 08/31/2018 01:41    Cardiac Studies   Echocardiogram: 03/2018 Study Conclusions  - Left ventricle: The cavity size was normal. Wall thickness was   increased in a pattern of moderate LVH. The estimated ejection   fraction was 55%. Wall motion was normal; there were no regional   wall motion abnormalities. The study was not technically   sufficient to allow evaluation of LV diastolic dysfunction due to   atrial fibrillation. - Aortic valve: Mildly calcified annulus. Trileaflet. - Mitral valve: Mildly thickened leaflets. There was trivial   regurgitation. - Left atrium: The atrium was moderately to severely dilated. - Right atrium: Central venous pressure (est): 3 mm Hg. - Tricuspid valve: There was trivial regurgitation. - Pulmonary arteries: Systolic pressure could not be accurately   estimated. - Pericardium, extracardiac: A trivial pericardial effusion was   identified.  Patient Profile     53 y.o. male with past medical history of chronic diastolic CHF, permanent atrial fibrillation (on Xarelto), HTN, HLD, and medication noncompliance who presented to [facility] ED on 3/11 for worsening dyspnea. Currently admitted for CHF exacerbation.    Assessment & Plan    1. Acute on Chronic Diastolic CHF - He has experienced multiple admissions for recurrent CHF exacerbations and in talking with the patient he is noncompliant with TID Bumex dosing and also consumes a high sodium diet. He reports prior intolerances to Lasix and Torsemide. He tells me today that he consumes SPAM multiple times per week but does not think this contributes to his fluid accumulation and he believes the diuretics are not working properly. I offered to place a Nutrition consult to help further assist with education surrounding a low sodium diet but he declined as he said he cannot continue to consume foods like are served in the hospital.  - he has been  receiving IV Lasix 60 mg twice daily with a recorded net output of -8.6 L thus far and weight having declined by 8 lbs. Continue with current dosing for now as renal function has remained stable and he is still volume overloaded by examination.   2. Permanent Atrial Fibrillation - Rates have improved into the 60's to 80's by review of telemetry. Continue Coreg at 25mg  BID for rate-control.  - He denies any evidence of active bleeding. Continue Xarelto for anticoagulation.  3. HTN - BP has been variable at 113/79 - 168/109 within the past 24 hours. He is currently taking Lisinopril 2.5 mg daily, Spironolactone 100 mg daily, and Coreg 25 mg twice daily. Will further titrate Lisinopril to 5mg  daily.   4. HLD -  continue PTA Atorvastatin 20mg  daily.   5. Hypokalemia - resolved. K+ at 3.6 this AM.    For questions or updates, please contact [facility] Please consult [email or web address] for contact info under Cardiology/STEMI.   Signed, Ellsworth Lennox , PA-C 7:54 AM 09/01/2018 Pager: [phone]  The patient was seen and examined, and I agree with the history, physical exam, assessment and plan as documented above.  His breathing has improved.  He admits to dietary noncompliance.  He is diuresing well.  He is mildly hypokalemic and this will need repletion.  Otherwise, continue current diuretic regimen.  Lisinopril will be increased to 5 mg daily for better blood pressure control.  Renal function is stable.  Prentice Docker, MD, [facility]  09/01/2018 10:31 AM

## 2018-09-02 ENCOUNTER — Ambulatory Visit: Payer: Self-pay

## 2018-09-02 LAB — BASIC METABOLIC PANEL
Anion gap: 7 (ref 5–15)
BUN: 19 mg/dL (ref 6–20)
CALCIUM: 8.6 mg/dL — AB (ref 8.9–10.3)
CO2: 28 mmol/L (ref 22–32)
Chloride: 105 mmol/L (ref 98–111)
Creatinine, Ser: 1.2 mg/dL (ref 0.61–1.24)
GFR calc Af Amer: >60 mL/min (ref >60)
GFR calc non Af Amer: >60 mL/min (ref >60)
Glucose, Bld: 96 mg/dL (ref 70–99)
Potassium: 3.4 mmol/L — ABNORMAL LOW (ref 3.5–5.1)
Sodium: 140 mmol/L (ref 135–145)

## 2018-09-02 LAB — MAGNESIUM: Magnesium: 1.9 mg/dL (ref 1.7–2.4)

## 2018-09-02 MED ORDER — MAGNESIUM OXIDE 400 (241.3 MG) MG PO TABS
400.0000 mg | ORAL_TABLET | Freq: Once | ORAL | Status: AC
  Administered 2018-09-02: 400 mg via ORAL
  Filled 2018-09-02: qty 1

## 2018-09-02 MED ORDER — LISINOPRIL 5 MG PO TABS: 5.0000 mg | ORAL_TABLET | Freq: Every day | ORAL | 1 refills | Status: DC

## 2018-09-02 MED ORDER — BUMETANIDE 1 MG PO TABS: 2.0000 mg | ORAL_TABLET | Freq: Two times a day (BID) | ORAL | Status: DC

## 2018-09-02 MED ORDER — BUMETANIDE 2 MG PO TABS: 2.0000 mg | ORAL_TABLET | Freq: Two times a day (BID) | ORAL | 1 refills | Status: DC

## 2018-09-02 MED ORDER — POTASSIUM CHLORIDE CRYS ER 20 MEQ PO TBCR
60.0000 meq | EXTENDED_RELEASE_TABLET | Freq: Once | ORAL | Status: AC
  Administered 2018-09-02: 60 meq via ORAL
  Filled 2018-09-02: qty 3

## 2018-09-02 NOTE — Progress Notes (Signed)
Patient eager to be discharged to home. Dr. Arbutus Leas gave verbal order to go ahead and give night time dose of lasix 60mg  before patient left due to noncompliance at home. Patient received lasix and discharge orders. He verbalized understanding. One peripheral IV was removed prior to discharge. Patient was taken down via wheelchair to be discharged.

## 2018-09-02 NOTE — Progress Notes (Signed)
Progress Note  Patient Name: [name] Date of Encounter: 09/02/2018  Primary Cardiologist: Prentice Docker, MD   Subjective   He is in good spirits and eager to go home.  He denies chest pain and palpitations and said his breathing and leg swelling have improved.  Inpatient Medications    Scheduled Meds: . atorvastatin  20 mg Oral q1800  . budesonide  0.25 mg Inhalation BID  . carvedilol  25 mg Oral BID WC  . docusate sodium  100 mg Oral BID  . ferrous gluconate  324 mg Oral BID WC  . furosemide  60 mg Intravenous BID  . lisinopril  5 mg Oral Daily  . magnesium oxide  400 mg Oral Once  . mouth rinse  15 mL Mouth Rinse BID  . pantoprazole  40 mg Oral QPM  . potassium chloride SA  20 mEq Oral BID  . potassium chloride  60 mEq Oral Once  . rivaroxaban  20 mg Oral Q supper  . sodium chloride flush  3 mL Intravenous Q12H  . spironolactone  100 mg Oral Daily  . tamsulosin  0.4 mg Oral Daily   Continuous Infusions: . sodium chloride     PRN Meds: sodium chloride, acetaminophen **OR** acetaminophen, albuterol, sodium chloride flush   Vital Signs    Vitals:   09/02/18 0552 09/02/18 0557 09/02/18 0750 09/02/18 0800  BP: (!) 115/98   106/90  Pulse: 84   69  Resp: 12   (!) 25  Temp:  98.2 F (36.8 C) 98.9 F (37.2 C)   TempSrc:  Oral Oral   SpO2: 92%   93%  Weight:      Height:        Intake/Output Summary (Last 24 hours) at 09/02/2018 1008 Last data filed at 09/02/2018 0830 Gross per 24 hour  Intake 460 ml  Output 2300 ml  Net -1840 ml   Filed Weights   08/31/18 0641 09/01/18 0600 09/02/18 0500  Weight: (!) 174.1 kg (!) 170.1 kg (!) 167.5 kg    Telemetry    Sinus rhythm with PVCs and runs of nonsustained ventricular tachycardia, the longest of which was 7 beats- Personally Reviewed  ECG    No new tracings- Personally Reviewed  Physical Exam   GEN: No acute distress.  Wearing sunglasses. Neck: No JVD Cardiac:  Irregular rhythm, normal heart  rate, no murmurs, rubs, or gallops.  Respiratory: Clear to auscultation bilaterally. GI: Soft, nontender, non-distended  MS:  Trace chronic bilateral lower extremity edema; No deformity. Neuro:  Nonfocal  Psych: Normal affect   Labs    Chemistry Recent Labs  Lab 08/31/18 0134 09/01/18 0506 09/02/18 0423  NA 139 142 140  K 3.4* 3.6 3.4*  CL 109 108 105  CO2 24 28 28   GLUCOSE 97 90 96  BUN 13 15 19   CREATININE 1.00 1.05 1.20  CALCIUM 8.1* 8.8* 8.6*  PROT 6.4* 6.5  --   ALBUMIN 3.5 3.5  --   AST 19 15  --   ALT 15 14  --   ALKPHOS 61 55  --   BILITOT 0.8 1.5*  --   GFRNONAA >60 >60 >60  GFRAA >60 >60 >60  ANIONGAP 6 6 7      Hematology Recent Labs  Lab 08/31/18 0134 09/01/18 0506  WBC 4.3 4.0  RBC 4.18* 4.33  HGB 11.8* 12.1*  HCT 37.3* 38.8*  MCV 89.2 89.6  MCH 28.2 27.9  MCHC 31.6 31.2  RDW 14.0  14.0  PLT 171 179    Cardiac Enzymes Recent Labs  Lab 08/31/18 0134  TROPONINI 0.03*   No results for input(s): TROPIPOC in the last 168 hours.   BNP Recent Labs  Lab 08/31/18 0134  BNP 341.0*     DDimer No results for input(s): DDIMER in the last 168 hours.   Radiology    No results found.  Cardiac Studies   Echocardiogram: 03/2018 Study Conclusions  - Left ventricle: The cavity size was normal. Wall thickness was increased in a pattern of moderate LVH. The estimated ejection fraction was 55%. Wall motion was normal; there were no regional wall motion abnormalities. The study was not technically sufficient to allow evaluation of LV diastolic dysfunction due to atrial fibrillation. - Aortic valve: Mildly calcified annulus. Trileaflet. - Mitral valve: Mildly thickened leaflets. There was trivial regurgitation. - Left atrium: The atrium was moderately to severely dilated. - Right atrium: Central venous pressure (est): 3 mm Hg. - Tricuspid valve: There was trivial regurgitation. - Pulmonary arteries: Systolic pressure could not be  accurately estimated. - Pericardium, extracardiac: A trivial pericardial effusion was identified.  Patient Profile     53 y.o. male with past medical history of chronic diastolic CHF, permanent atrial fibrillation (on Xarelto), HTN, HLD, and medication noncompliance who presented to [facility] ED on 3/11 for worsening dyspnea. Currently admitted for CHF exacerbation.    Assessment & Plan    1.  Acute on chronic diastolic heart failure: Nearly 2.9 L of output in last 24 hours.  He can be transitioned from IV diuretics to oral Bumex.  Even if he is compliant with twice daily dosing this would be considered a success.  Emphasized the importance of medication and dietary compliance.  We had a long talk about sodium restriction.  I reached out to his [facility] community care Production designer, theatre/television/film and she plans to follow-up with him.  He is mildly hypokalemic with runs of nonsustained VT so I will give additional potassium chloride as well as magnesium oxide.  2.  Permanent atrial fibrillation: Heart rate is controlled on carvedilol 25 mg twice daily.  Continue Xarelto for systemic anticoagulation.  3.  Hypertension: Blood pressure is now normal with increase of lisinopril to 5 mg daily.  Continue spironolactone 100 mg daily and carvedilol 25 mg twice daily.  4.  Hyperlipidemia: Continue atorvastatin 20 mg daily.  5.  Hypokalemia: 3.4 today.  I will give additional potassium chloride 60 meq in addition to bid dosing.  6. NSVT:  Asymptomatic. He is mildly hypokalemic with runs of nonsustained VT so I will give additional potassium chloride as well as magnesium oxide.   [facility] will sign off.   Medication Recommendations:  As above Other recommendations (labs, testing, etc): BMET in 1 week. Follow up as an outpatient: We will arrange  For questions or updates, please contact [facility] Please consult [email or web address] for contact info under Cardiology/STEMI.      Signed, Prentice Docker, MD   09/02/2018, 10:08 AM

## 2018-09-02 NOTE — Care Management Important Message (Signed)
Important Message  Patient Details  Name: [name] MRN: [id number] Date of Birth: 08/16/1965   Medicare Important Message Given:  Yes    Corey Harold 09/02/2018, 11:27 AM

## 2018-09-02 NOTE — Discharge Summary (Signed)
Physician Discharge Summary  [name]:[id number] DOB: 1966/06/10 DOA: 08/31/2018  PCP: Pearson Grippe, MD  Admit date: 08/31/2018 Discharge date: 09/02/2018  Admitted From: Home Disposition:  Home  Recommendations for Outpatient Follow-up:  1. Follow up with PCP in 1-2 weeks 2. Please obtain BMP/CBC in one week     Discharge Condition: Stable CODE STATUS: FULL Diet recommendation: Heart Healthy   Brief/Interim Summary: 53 y/o male withwith medical history of history significant of sCHF (numerous admissions for uncompensated CHF but patient continues to be noncompliant with diet and medications outpatient and has been fired from numerous cardiology clinics secondary to no showing for appointments), atrial fibrillation (on xarelto), HTN, MI, asthma, OSA (no on CPAP or BiPap) presents with progressive leg swelling and sob since d/c from his last admission 08/15/18-08/18/18. The patient presents again with 2 days of shortness of breath and worsening lower extremity edema as well as increasing abdominal girth. He denies any fevers, chills, headache, nausea, vomiting, diarrhea. He has a nonproductive cough. He is chronically on 3 L nasal cannula. He endorses compliance with his medications, but he has had a documented outpatient visit with our clinical pharmacist who documented that the patient has not had his Bumexfilled since July 2019. Xarelto last refilled on 09/2017 and spiro last filled 03/2018.In addition, the patient has had indiscretion with his dietary intake. On the day of admission, the patient ate a few hotdogs, fried fish, and hash puppies.  Discharge Diagnoses:  Acute on chronic systolicand diastolicCHF -Hispriordry [id number] lbs on 02/08/17 -2/27 discharge weight 364 lbs -2/28 office weight 371 lbs -3/13 discharge weight 369.2 -NEG 16.6 lbs for the admission - continuelasix to  IV BID-->>8L urine output last 24 hr -5/22/18Echo--EF 35-40 percent,  diffuse HK, PSP 56, mild MR, +PFO -04/01/18 Echo EF 55%, no WMA - fluid restriction -reset dry weight--likely be weighing heavier now without fluid retention due to eating habits and sedentary lifestyle. - pt states he eats "french fries" regularly -continue spiro, coreg, IV lasix -remains clinically fluid overloaded  Permanentatrial fibrillation - Continue xarelto - continuecoreg -rate controlled -pharmacy records show pt has not picked up xarelto in >90 days  Noncompliance with medical therapy -09/02/18 pt wanted to go home--did not want to stay for further diuresis--"I gotta get out of here to get some real food" -he negotiated with cardiology to allow him to take Bumex bid rather than tid (which he was not taking anyway) -prior to d/c pt stated he has no Bumex at home and requested a new Rx  Chronic respiratory Failure with hypoxia -chronically on 3L at home  HTN -continue coreg, spiro, lasix -improving with diuresis -lisinopril 5 mg added  GERD - continue protoninx  Hyperlipidemia -continue statin  Chronic lymphedema -pt noncompliant with therapies  CKD stage 2 -baseline creatinine 1.0-1.3 -monitor with diuresis  OSA -noncompliant with CPAP  Hypokalemia -replete -check mag  Morbid Obesity -BMI 52.06 -lifestyle modification   Discharge Instructions   Allergies as of 09/02/2018      Reactions   Banana Shortness Of Breath   Bee Venom Shortness Of Breath, Swelling, Other (See Comments)   Reaction:  Facial swelling   Food Shortness Of Breath, Rash, Other (See Comments)   Pt states that he is allergic to strawberries.     Aspirin Other (See Comments)   Reaction:  GI upset    Metolazone Other (See Comments)   Pt states that he stopped taking this med due to heart attack like symptoms.  Oatmeal Nausea And Vomiting   Orange Juice [orange Oil] Nausea And Vomiting   All acidic products make him nauseous and upset stomach   Torsemide  Swelling, Other (See Comments)   Reaction:  Swelling of feet/legs    Diltiazem Palpitations   Hydralazine Palpitations   Lipitor [atorvastatin] Other (See Comments)   Reaction:  Nose bleeds       Medication List    STOP taking these medications   beclomethasone 40 MCG/ACT inhaler Commonly known as:  QVAR     TAKE these medications   albuterol (2.5 MG/3ML) 0.083% nebulizer solution Commonly known as:  PROVENTIL Take 2.5 mg by nebulization every 6 (six) hours as needed for wheezing or shortness of breath.   atorvastatin 20 MG tablet Commonly known as:  LIPITOR Take 1 tablet (20 mg total) by mouth daily at 6 PM for 30 days.   bumetanide 2 MG tablet Commonly known as:  BUMEX Take 1 tablet (2 mg total) by mouth 2 (two) times daily. Start taking on:  September 03, 2018 What changed:  when to take this   carvedilol 25 MG tablet Commonly known as:  COREG Take 1 tablet (25 mg total) by mouth 2 (two) times daily with a meal for 30 days.   docusate sodium 100 MG capsule Commonly known as:  COLACE Take 1 capsule (100 mg total) by mouth 2 (two) times daily.   ferrous gluconate 324 MG tablet Commonly known as:  FERGON Take 1 tablet (324 mg total) by mouth 2 (two) times daily with a meal.   fluticasone 220 MCG/ACT inhaler Commonly known as:  FLOVENT HFA Inhale 2 puffs into the lungs 2 (two) times daily as needed (for shortness of breath).   lisinopril 5 MG tablet Commonly known as:  PRINIVIL,ZESTRIL Take 1 tablet (5 mg total) by mouth daily. Start taking on:  September 03, 2018 What changed:    medication strength  how much to take   OXYGEN Inhale 3.5 L into the lungs daily.   pantoprazole 40 MG tablet Commonly known as:  PROTONIX Take 1 tablet (40 mg total) by mouth daily at 6 (six) AM. What changed:  when to take this   potassium chloride SA 20 MEQ tablet Commonly known as:  K-DUR,KLOR-CON Take 1 tablet (20 mEq total) by mouth 2 (two) times daily.   rivaroxaban 20 MG  Tabs tablet Commonly known as:  XARELTO Take 1 tablet (20 mg total) by mouth daily with breakfast. What changed:  when to take this   spironolactone 100 MG tablet Commonly known as:  ALDACTONE Take 100 mg by mouth daily. For fluid   tamsulosin 0.4 MG Caps capsule Commonly known as:  FLOMAX Take 1 capsule (0.4 mg total) by mouth daily.      Follow-up Information    Ellsworth Lennox, PA-C Follow up on 09/15/2018.   Specialties:  Physician Assistant, Cardiology Why:  Cardiology Hospital Follow-Up on 09/15/2018 at 2:00 PM.  Contact information: [address] [place] [address] [phone]          Allergies  Allergen Reactions   Banana Shortness Of Breath   Bee Venom Shortness Of Breath, Swelling and Other (See Comments)    Reaction:  Facial swelling   Food Shortness Of Breath, Rash and Other (See Comments)    Pt states that he is allergic to strawberries.     Aspirin Other (See Comments)    Reaction:  GI upset    Metolazone Other (See Comments)  Pt states that he stopped taking this med due to heart attack like symptoms.    Oatmeal Nausea And Vomiting   Orange Juice [Orange Oil] Nausea And Vomiting    All acidic products make him nauseous and upset stomach   Torsemide Swelling and Other (See Comments)    Reaction:  Swelling of feet/legs    Diltiazem Palpitations   Hydralazine Palpitations   Lipitor [Atorvastatin] Other (See Comments)    Reaction:  Nose bleeds     Consultations:  cardiology   Procedures/Studies: Dg Chest 2 View  Result Date: 08/15/2018 CLINICAL DATA:  53 y/o M; shortness of breath, history of heart failure. EXAM: CHEST - 2 VIEW COMPARISON:  08/03/2018 chest radiograph FINDINGS: Stable cardiomegaly given projection and technique. Diffuse reticular and hazy basilar opacities of the lungs. Possible small right effusion. No pneumothorax. No acute osseous abnormality is evident. IMPRESSION: Cardiomegaly, interstitial pulmonary edema,  and mild alveolar edema. Possible small right effusion. Electronically Signed   By: Mitzi Hansen M.D.   On: 08/15/2018 02:01   Dg Chest Port 1 View  Result Date: 08/31/2018 CLINICAL DATA:  Shortness of breath and lower leg swelling for 2 days. Worsening. History of CHF, hypertension, asthma, atrial fibrillation, former smoker. EXAM: PORTABLE CHEST 1 VIEW COMPARISON:  08/15/2018 FINDINGS: Cardiac enlargement with pulmonary vascular congestion. Perihilar infiltration suggesting edema. No blunting of costophrenic angles. No pneumothorax. Mediastinal contours appear intact. IMPRESSION: Cardiac enlargement with pulmonary vascular congestion and perihilar edema. Electronically Signed   By: Burman Nieves M.D.   On: 08/31/2018 01:41   Dg Chest Portable 1 View  Result Date: 08/03/2018 CLINICAL DATA:  53 year old male with a history of leg edema shortness of breath EXAM: PORTABLE CHEST 1 VIEW COMPARISON:  07/25/2018, 07/22/2018, 07/21/2018 FINDINGS: Cardiomediastinal silhouette unchanged with cardiomegaly. Fullness in the central vasculature with interlobular septal thickening. No pneumothorax. No large pleural effusion. No displaced fracture. IMPRESSION: Cardiomegaly with evidence of mild pulmonary edema. Atypical infection not excluded. Electronically Signed   By: Gilmer Mor D.O.   On: 08/03/2018 19:03        Discharge Exam: Vitals:   09/02/18 1000 09/02/18 1140  BP: 117/87   Pulse: 65   Resp: (!) 26   Temp:  (!) 97.3 F (36.3 C)  SpO2: 99%    Vitals:   09/02/18 0750 09/02/18 0800 09/02/18 1000 09/02/18 1140  BP:  106/90 117/87   Pulse:  69 65   Resp:  (!) 25 (!) 26   Temp: 98.9 F (37.2 C)   (!) 97.3 F (36.3 C)  TempSrc: Oral   Oral  SpO2:  93% 99%   Weight:      Height:        General: Pt is alert, awake, not in acute distress Cardiovascular: IRRR, S1/S2 +, no rubs, no gallops Respiratory: CTA bilaterally, no wheezing, no rhonchi Abdominal: Soft, NT, ND, bowel  sounds + Extremities: 1+ +LE edema, no cyanosis   The results of significant diagnostics from this hospitalization (including imaging, microbiology, ancillary and laboratory) are listed below for reference.    Significant Diagnostic Studies: Dg Chest 2 View  Result Date: 08/15/2018 CLINICAL DATA:  54 y/o M; shortness of breath, history of heart failure. EXAM: CHEST - 2 VIEW COMPARISON:  08/03/2018 chest radiograph FINDINGS: Stable cardiomegaly given projection and technique. Diffuse reticular and hazy basilar opacities of the lungs. Possible small right effusion. No pneumothorax. No acute osseous abnormality is evident. IMPRESSION: Cardiomegaly, interstitial pulmonary edema, and mild alveolar edema. Possible small  right effusion. Electronically Signed   By: Mitzi Hansen M.D.   On: 08/15/2018 02:01   Dg Chest Port 1 View  Result Date: 08/31/2018 CLINICAL DATA:  Shortness of breath and lower leg swelling for 2 days. Worsening. History of CHF, hypertension, asthma, atrial fibrillation, former smoker. EXAM: PORTABLE CHEST 1 VIEW COMPARISON:  08/15/2018 FINDINGS: Cardiac enlargement with pulmonary vascular congestion. Perihilar infiltration suggesting edema. No blunting of costophrenic angles. No pneumothorax. Mediastinal contours appear intact. IMPRESSION: Cardiac enlargement with pulmonary vascular congestion and perihilar edema. Electronically Signed   By: Burman Nieves M.D.   On: 08/31/2018 01:41   Dg Chest Portable 1 View  Result Date: 08/03/2018 CLINICAL DATA:  53 year old male with a history of leg edema shortness of breath EXAM: PORTABLE CHEST 1 VIEW COMPARISON:  07/25/2018, 07/22/2018, 07/21/2018 FINDINGS: Cardiomediastinal silhouette unchanged with cardiomegaly. Fullness in the central vasculature with interlobular septal thickening. No pneumothorax. No large pleural effusion. No displaced fracture. IMPRESSION: Cardiomegaly with evidence of mild pulmonary edema. Atypical  infection not excluded. Electronically Signed   By: Gilmer Mor D.O.   On: 08/03/2018 19:03     Microbiology: Recent Results (from the past 240 hour(s))  MRSA PCR Screening     Status: None   Collection Time: 08/31/18  6:26 AM  Result Value Ref Range Status   MRSA by PCR NEGATIVE NEGATIVE Final    Comment:        The GeneXpert MRSA Assay (FDA approved for NASAL specimens only), is one component of a comprehensive MRSA colonization surveillance program. It is not intended to diagnose MRSA infection nor to guide or monitor treatment for MRSA infections. Performed at [facility], [place], [address], [place], [place] [address]      Labs: Basic Metabolic Panel: Recent Labs  Lab 08/31/18 0134 09/01/18 0506 09/02/18 0423  NA 139 142 140  K 3.4* 3.6 3.4*  CL 109 108 105  CO2 24 28 28   GLUCOSE 97 90 96  BUN 13 15 19   CREATININE 1.00 1.05 1.20  CALCIUM 8.1* 8.8* 8.6*  MG  --   --  1.9   Liver Function Tests: Recent Labs  Lab 08/31/18 0134 09/01/18 0506  AST 19 15  ALT 15 14  ALKPHOS 61 55  BILITOT 0.8 1.5*  PROT 6.4* 6.5  ALBUMIN 3.5 3.5   No results for input(s): LIPASE, AMYLASE in the last 168 hours. No results for input(s): AMMONIA in the last 168 hours. CBC: Recent Labs  Lab 08/31/18 0134 09/01/18 0506  WBC 4.3 4.0  NEUTROABS 2.9  --   HGB 11.8* 12.1*  HCT 37.3* 38.8*  MCV 89.2 89.6  PLT 171 179   Cardiac Enzymes: Recent Labs  Lab 08/31/18 0134  TROPONINI 0.03*   BNP: Invalid input(s): POCBNP CBG: No results for input(s): GLUCAP in the last 168 hours.  Time coordinating discharge:  36 minutes  Signed:  Catarina Hartshorn, DO [facility] Pager: [phone] 09/02/2018, 2:15 PM

## 2018-09-05 ENCOUNTER — Other Ambulatory Visit: Payer: Self-pay

## 2018-09-05 NOTE — Patient Outreach (Signed)
[facility]) Care Management  09/05/2018  [name] Feb 18, 1966 [id number]  Successful outreach with Mr. Edmison. He reports feeling very well today and denied experiencing shortness of breath, chest discomfort or increased edema since discharge. Reports ambulating well. No complaints of tightness or discomfort to lower extremities.  Discussed medications and diet recommendations. He was quite excited about the frequency of Bumex. States he can "handle" taking Bumex twice a day and reports taking his medications as prescribed. He admits to eating fast food after being discharged. Reports an increased intake of boiled vegetables and attempting to decrease sodium intake.  Weight = 374.6lbs  PLAN Will continue frequent outreach.  Katha Cabal [facility] Community Care Manager [phone]

## 2018-09-06 ENCOUNTER — Other Ambulatory Visit: Payer: Self-pay

## 2018-09-06 NOTE — Patient Outreach (Addendum)
[facility]) Care Management  09/06/2018  [name] [birth year] [id number]   [name] reported taking his medications at noon but did not weigh immediately after waking and voiding this morning. Agreed to weigh at time of call. Reported a weight of 378lbs fully clothed. Reviewed instructions regarding daily weights and importance of consistent monitoring. Denied changes or worsening symptoms since outreach on yesterday.  PLAN Will follow up on tomorrow.  Katha Cabal [facility] Community Care Manager [phone]

## 2018-09-07 ENCOUNTER — Other Ambulatory Visit: Payer: Self-pay

## 2018-09-07 NOTE — Patient Outreach (Signed)
[facility]) Care Management  09/07/2018  [name] 1966/05/08 [id number]    Daily Weight Check [name] returned call to report daily weight. Weight=383.2 lbs. Reports forgetting to check weight prior to eating and getting dressed. Denied changes in symptoms and insists that he is taking his medications at noon and 6pm.   Reminded of pending appointments: -Labs due 09/09/18. -Follow-up with Dr. Selena Batten on 09/13/18. -Follow-up with Cardiology on 09/15/18. Reports having transportation but will notify RNCM if assistance is needed.   PLAN -Unable to verify accuracy of fluid accumulation due to [name] not weighing correctly. -Will continue daily outreach for the remainder of the week and update Cardiology.  Katha Cabal [facility] Community Care Manager [phone]

## 2018-09-08 ENCOUNTER — Other Ambulatory Visit: Payer: Self-pay

## 2018-09-08 NOTE — Patient Outreach (Addendum)
[facility]) Care Management  09/08/2018  [name] [birth year] [id number]     Daily Weight Check Unsuccessful outreach attempt. Left message for [name] to return call.   PLAN Pending call back.    Katha Cabal [facility] Community Care Manager [phone]

## 2018-09-08 NOTE — Patient Outreach (Signed)
[facility]) Care Management  09/08/2018  LATIMER JODON Sep 02, 1965 [id number]  Mr. Seeling returned call to report daily weight. Reports a weight of 386.6lbs. States he forgot to weigh after waking up but checked just prior to calling. Reports taking prescribed medications and feeling "very good" today. Denied complaints of shortness of breath or chest discomfort. Reports ambulating well and states his legs feel less swollen. Agreeable to follow up weight check on tomorrow. PLAN Will follow up with Mr. Damer tomorrow and update Cardiology.  Katha Cabal [facility] Community Care Manager [phone]

## 2018-09-09 ENCOUNTER — Other Ambulatory Visit: Payer: Self-pay

## 2018-09-09 DIAGNOSIS — I5043 Acute on chronic combined systolic (congestive) and diastolic (congestive) heart failure: Secondary | ICD-10-CM | POA: Diagnosis not present

## 2018-09-09 DIAGNOSIS — J449 Chronic obstructive pulmonary disease, unspecified: Secondary | ICD-10-CM | POA: Diagnosis not present

## 2018-09-09 NOTE — Patient Outreach (Signed)
[facility]) Care Management  09/09/2018  SHANA SALBERG 1965-11-11 [id number]  Daily Weight Check Attempted outreach for daily weight check. Spoke with Mr. France's mother. Reported he was not home but agreed to have him call when he returns.   PLAN Pending return call.   Katha Cabal [facility] Community Care Manager [phone]

## 2018-09-09 NOTE — Patient Outreach (Signed)
[facility]) Care Management  09/09/2018  [name] 1966/06/14 [id number]  [name] returned call to report daily weight. Reports not checking weight earlier today but weighed at time of call. Reading of 379.8 lbs. States he still feels well and denied worsening symptoms since discharge.   Reminded of pending appointments with PCP and Cardiology. Reports not having labs drawn today but will complete during follow up with [name]. I will follow up with [facility] clinic to confirm.   PLAN Will update Cardiology and continue frequent outreach with [name].   Katha Cabal [facility] Community Care Manager [phone]

## 2018-09-12 ENCOUNTER — Other Ambulatory Visit: Payer: Self-pay | Admitting: Pharmacist

## 2018-09-12 ENCOUNTER — Other Ambulatory Visit: Payer: Self-pay

## 2018-09-12 NOTE — Patient Outreach (Signed)
[facility]) Care Management  09/12/2018  [name] May 27, 1966 [id number]   Patient was called regarding medication management. HIPAA identifiers were obtained. Patient was previously followed by [facility] Pharmacist, [name]. [name] is no longer with [facility]. Patient was called today to establish care and follow up. Patient said he could not review medications with me today because they were "upstairs".    Patient said he did not weigh this morning. He weighed while we were on the phone but said he had already eaten breakfast at the time of our call. The patient said he weighted 381 pounds today.  Plan: Made an appointment to call the patient tomorrow to review medications.   [name], PharmD, BCACP [facility] Clinical Pharmacist [phone]

## 2018-09-12 NOTE — Patient Outreach (Signed)
  [facility]) Care Management  09/12/2018  [name] September 29, 1965 [id number]   Successful outreach with [name]. Weight of 381 lbs. Reports forgetting to check weight prior to eating and getting dressed. Experienced episodes of shortness of breath and decreased activity tolerance over the weekend, but reports feeling well today. Denied increased edema to abdomen or lower extremities.  PLAN Will follow up on 09/14/18.  Katha Cabal [facility] Community Care Manager [phone]

## 2018-09-13 ENCOUNTER — Other Ambulatory Visit: Payer: Self-pay | Admitting: Pharmacist

## 2018-09-13 ENCOUNTER — Ambulatory Visit: Payer: Self-pay | Admitting: Pharmacist

## 2018-09-13 DIAGNOSIS — Z7901 Long term (current) use of anticoagulants: Secondary | ICD-10-CM | POA: Diagnosis not present

## 2018-09-13 DIAGNOSIS — I5023 Acute on chronic systolic (congestive) heart failure: Secondary | ICD-10-CM | POA: Diagnosis not present

## 2018-09-13 DIAGNOSIS — Z79899 Other long term (current) drug therapy: Secondary | ICD-10-CM | POA: Diagnosis not present

## 2018-09-13 DIAGNOSIS — I482 Chronic atrial fibrillation, unspecified: Secondary | ICD-10-CM | POA: Diagnosis not present

## 2018-09-13 DIAGNOSIS — I10 Essential (primary) hypertension: Secondary | ICD-10-CM | POA: Diagnosis not present

## 2018-09-13 NOTE — Patient Outreach (Signed)
[facility]) Care Management  09/13/2018  [name] Dec 04, 1965 [id number]   Patient was called today regarding medication management and review. I was late calling the patient due to our department's COVID-19 safety call.  Patient identified HIPAA and give me his weight today: 278 pounds. However, he said he was not able to talk at the time of my call.  Plan: Speak with the [facility] Nurse that has been following the patient to see how we can coordinate care.  Follow up with the patient directly in 1-2 weeks due to the referral backlog.   [name], PharmD, BCACP [facility] Clinical Pharmacist [phone]

## 2018-09-14 ENCOUNTER — Other Ambulatory Visit: Payer: Self-pay

## 2018-09-14 ENCOUNTER — Telehealth: Payer: Self-pay | Admitting: Student

## 2018-09-14 IMAGING — DX DG CHEST 2V
2 series · 2 of 2 positions shown · non-contrast
Comparison: Four days ago

CLINICAL DATA: Chest pain

EXAM:
CHEST  2 VIEW

[chest pa]
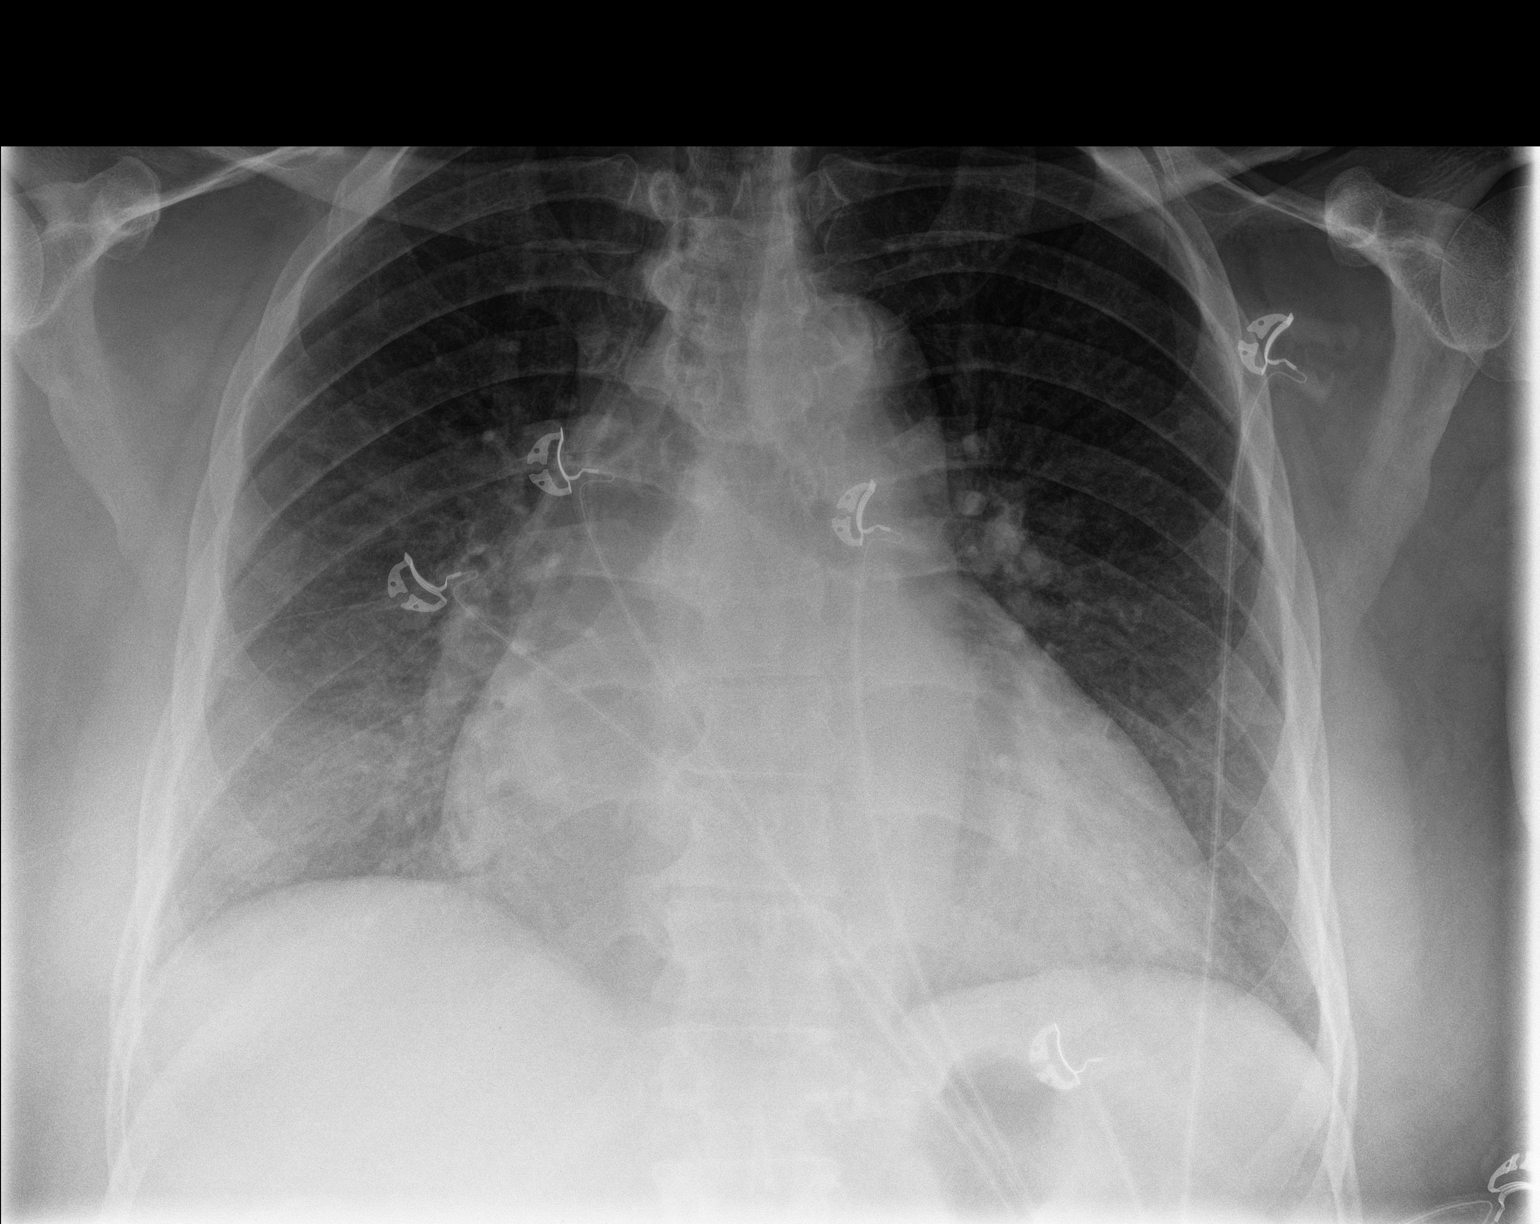

[chest lat]
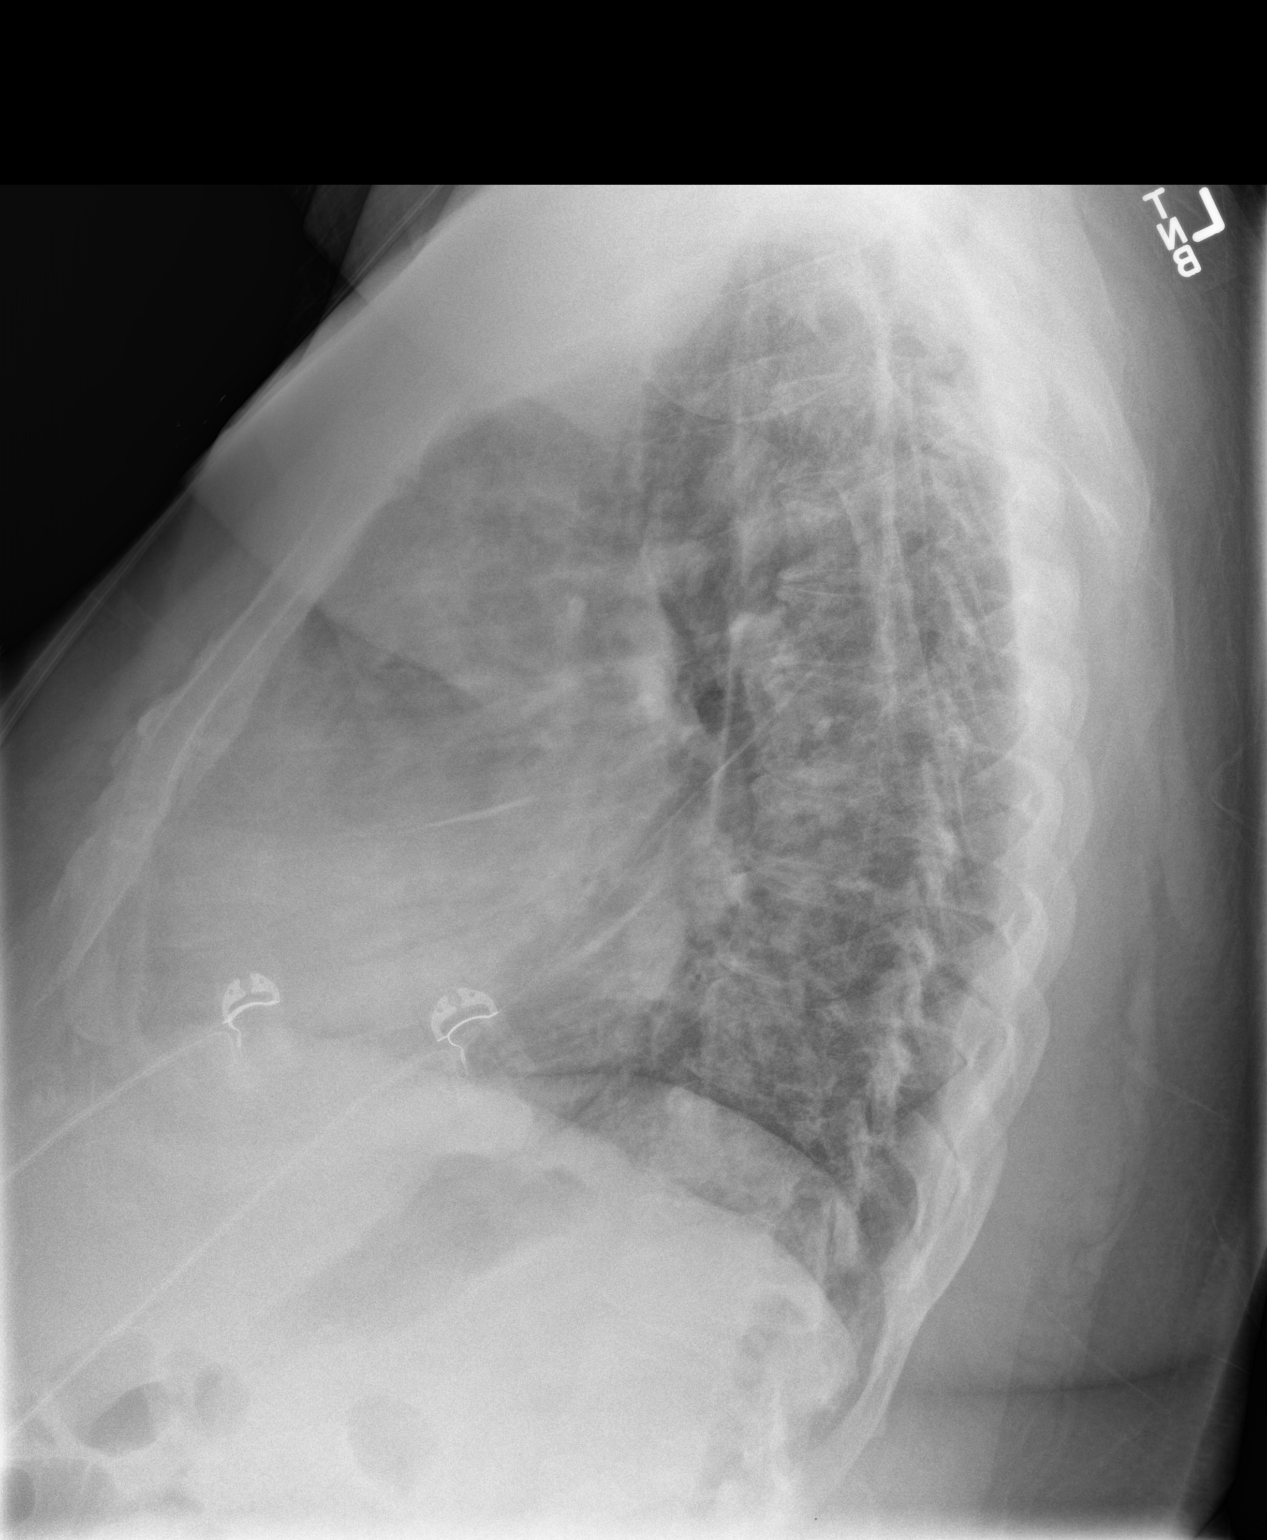

[2 of 2 positions shown; findings below may reference images not displayed]

FINDINGS: Cardiomegaly and vascular congestion. No Kerley lines or air
bronchogram. Mild haziness of lower lung zones, chronic and
accentuated by patient size. No effusion or pneumothorax.
IMPRESSION: Baseline appearance of the chest. Cardiomegaly and vascular
congestion.

## 2018-09-14 NOTE — Telephone Encounter (Signed)
   Cardiac Questionnaire:    Since your last visit or hospitalization:    1. Have you been having new or worsening chest pain? No   2. Have you been having new or worsening shortness of breath? No - At baseline 3. Have you been having new or worsening leg swelling, wt gain, or increase in abdominal girth (pants fitting more tightly)? No   4. Have you had any passing out spells? No    *A YES to any of these questions would result in the appointment being kept. *If all the answers to these questions are NO, we should indicate that given the current situation regarding the worldwide coronarvirus pandemic, at the recommendation of the CDC, we are looking to limit gatherings in our waiting area, and thus will reschedule their appointment beyond four weeks from today.   _____________   Discussed with the patient today and he reports overall doing well since his hospitalization. Has been weighing himself daily (at 378 lbs today) and denies any acute changes in this. Breathing at baseline. Says he has been alternating between taking his Bumex once daily vs. twice daily due to frequent urination. Compliance with BID dosing was strongly encouraged. Will cancel in-person visit for 3/26 and reschedule in 4-6 weeks. Patient was informed to call us in the interim if any new questions or concerns arise.   Signed, Ellsworth Lennox, PA-C 09/14/2018, 9:41 AM Pager: [phone]

## 2018-09-14 NOTE — Patient Outreach (Addendum)
[facility]) Care Management  09/14/2018  MOX YERBY 1966/01/06 [id number]   Successful outreach with Mr. Siva. Reports feeling well today. No complications since Monday. Admits to skipping medication doses and not weighing consistently. Reports improvement with diet and attempts to monitor fluid intake. Weight = 378lbs.  PLAN -Will continue frequent outreach to encourage compliance with medications and daily weights. -Will follow up with [facility] Pharmacist regarding medication adherence packages. -Will follow up on 09/16/18 to update care plan.   Katha Cabal [facility] Community Care Manager [phone]

## 2018-09-15 ENCOUNTER — Ambulatory Visit: Payer: Medicare Other | Admitting: Student

## 2018-09-16 ENCOUNTER — Other Ambulatory Visit: Payer: Self-pay

## 2018-09-16 NOTE — Patient Outreach (Signed)
[facility]) Care Management  09/16/2018  RICCI WEINZAPFEL 03-Nov-1965 [id number]   Successful outreach with Mr. Horita. Reports feeling well today. Denies worsening symptoms or increased edema. Reports taking medications at noon and will take next dose at 6pm. Weight checked at time of call. Reports a reading of 380lbs. Denies urgent concerns. Agreed to contact RNCM if needed prior to next outreach.   PLAN Will follow up on 09/19/18.   Katha Cabal [facility] Community Care Manager [phone]

## 2018-09-18 ENCOUNTER — Emergency Department (HOSPITAL_COMMUNITY): Payer: Medicare Other

## 2018-09-18 ENCOUNTER — Inpatient Hospital Stay (HOSPITAL_COMMUNITY)
Admission: EM | Admit: 2018-09-18 | Discharge: 2018-09-20 | DRG: 291 | Disposition: A | Discharge status: Home / Self Care | Payer: Medicare Other | Attending: Internal Medicine | Admitting: Internal Medicine

## 2018-09-18 ENCOUNTER — Encounter (HOSPITAL_COMMUNITY): Payer: Self-pay | Admitting: *Deleted

## 2018-09-18 ENCOUNTER — Other Ambulatory Visit: Payer: Self-pay

## 2018-09-18 DIAGNOSIS — I252 Old myocardial infarction: Secondary | ICD-10-CM

## 2018-09-18 DIAGNOSIS — I272 Pulmonary hypertension, unspecified: Secondary | ICD-10-CM | POA: Diagnosis present

## 2018-09-18 DIAGNOSIS — Z7901 Long term (current) use of anticoagulants: Secondary | ICD-10-CM | POA: Diagnosis not present

## 2018-09-18 DIAGNOSIS — R7989 Other specified abnormal findings of blood chemistry: Secondary | ICD-10-CM

## 2018-09-18 DIAGNOSIS — N189 Chronic kidney disease, unspecified: Secondary | ICD-10-CM | POA: Diagnosis not present

## 2018-09-18 DIAGNOSIS — N179 Acute kidney failure, unspecified: Secondary | ICD-10-CM | POA: Diagnosis not present

## 2018-09-18 DIAGNOSIS — I2510 Atherosclerotic heart disease of native coronary artery without angina pectoris: Secondary | ICD-10-CM | POA: Diagnosis not present

## 2018-09-18 DIAGNOSIS — I10 Essential (primary) hypertension: Secondary | ICD-10-CM | POA: Diagnosis not present

## 2018-09-18 DIAGNOSIS — Z9111 Patient's noncompliance with dietary regimen: Secondary | ICD-10-CM

## 2018-09-18 DIAGNOSIS — Z87891 Personal history of nicotine dependence: Secondary | ICD-10-CM | POA: Diagnosis not present

## 2018-09-18 DIAGNOSIS — D649 Anemia, unspecified: Secondary | ICD-10-CM

## 2018-09-18 DIAGNOSIS — I491 Atrial premature depolarization: Secondary | ICD-10-CM | POA: Diagnosis not present

## 2018-09-18 DIAGNOSIS — I499 Cardiac arrhythmia, unspecified: Secondary | ICD-10-CM | POA: Diagnosis not present

## 2018-09-18 DIAGNOSIS — I480 Paroxysmal atrial fibrillation: Secondary | ICD-10-CM | POA: Diagnosis present

## 2018-09-18 DIAGNOSIS — I130 Hypertensive heart and chronic kidney disease with heart failure and stage 1 through stage 4 chronic kidney disease, or unspecified chronic kidney disease: Principal | ICD-10-CM | POA: Diagnosis present

## 2018-09-18 DIAGNOSIS — I482 Chronic atrial fibrillation, unspecified: Secondary | ICD-10-CM

## 2018-09-18 DIAGNOSIS — I110 Hypertensive heart disease with heart failure: Secondary | ICD-10-CM | POA: Diagnosis not present

## 2018-09-18 DIAGNOSIS — I509 Heart failure, unspecified: Secondary | ICD-10-CM

## 2018-09-18 DIAGNOSIS — I4891 Unspecified atrial fibrillation: Secondary | ICD-10-CM | POA: Diagnosis not present

## 2018-09-18 DIAGNOSIS — R778 Other specified abnormalities of plasma proteins: Secondary | ICD-10-CM

## 2018-09-18 DIAGNOSIS — R0602 Shortness of breath: Secondary | ICD-10-CM | POA: Diagnosis not present

## 2018-09-18 DIAGNOSIS — Z9119 Patient's noncompliance with other medical treatment and regimen: Secondary | ICD-10-CM | POA: Diagnosis not present

## 2018-09-18 DIAGNOSIS — I5043 Acute on chronic combined systolic (congestive) and diastolic (congestive) heart failure: Secondary | ICD-10-CM | POA: Diagnosis not present

## 2018-09-18 DIAGNOSIS — N182 Chronic kidney disease, stage 2 (mild): Secondary | ICD-10-CM | POA: Diagnosis not present

## 2018-09-18 LAB — CBC WITH DIFFERENTIAL/PLATELET
Abs Immature Granulocytes: 0.01 10*3/uL (ref 0.00–0.07)
Basophils Absolute: 0 10*3/uL (ref 0.0–0.1)
Basophils Relative: 1 %
Eosinophils Absolute: 0.1 10*3/uL (ref 0.0–0.5)
Eosinophils Relative: 2 %
HEMATOCRIT: 39.9 % (ref 39.0–52.0)
Hemoglobin: 12.8 g/dL — ABNORMAL LOW (ref 13.0–17.0)
Immature Granulocytes: 0 %
Lymphocytes Relative: 26 %
Lymphs Abs: 1.1 10*3/uL (ref 0.7–4.0)
MCH: 28.2 pg (ref 26.0–34.0)
MCHC: 32.1 g/dL (ref 30.0–36.0)
MCV: 87.9 fL (ref 80.0–100.0)
MONOS PCT: 11 %
Monocytes Absolute: 0.5 10*3/uL (ref 0.1–1.0)
Neutro Abs: 2.6 10*3/uL (ref 1.7–7.7)
Neutrophils Relative %: 60 %
Platelets: 200 10*3/uL (ref 150–400)
RBC: 4.54 MIL/uL (ref 4.22–5.81)
RDW: 14 % (ref 11.5–15.5)
WBC: 4.4 10*3/uL (ref 4.0–10.5)
nRBC: 0 % (ref 0.0–0.2)

## 2018-09-18 LAB — BASIC METABOLIC PANEL
Anion gap: 7 (ref 5–15)
BUN: 18 mg/dL (ref 6–20)
CO2: 22 mmol/L (ref 22–32)
CREATININE: 1.51 mg/dL — AB (ref 0.61–1.24)
Calcium: 8.6 mg/dL — ABNORMAL LOW (ref 8.9–10.3)
Chloride: 109 mmol/L (ref 98–111)
GFR calc Af Amer: >60 mL/min (ref >60)
GFR calc non Af Amer: 53 mL/min — ABNORMAL LOW (ref >60)
Glucose, Bld: 107 mg/dL — ABNORMAL HIGH (ref 70–99)
Potassium: 3.9 mmol/L (ref 3.5–5.1)
Sodium: 138 mmol/L (ref 135–145)

## 2018-09-18 LAB — BRAIN NATRIURETIC PEPTIDE: B Natriuretic Peptide: 256 pg/mL — ABNORMAL HIGH (ref 0.0–100.0)

## 2018-09-18 LAB — TROPONIN I: Troponin I: 0.03 ng/mL (ref <0.03)

## 2018-09-18 MED ORDER — FUROSEMIDE 10 MG/ML IJ SOLN
60.0000 mg | Freq: Two times a day (BID) | INTRAMUSCULAR | Status: DC
  Administered 2018-09-18 – 2018-09-20 (×5): 60 mg via INTRAVENOUS
  Filled 2018-09-18 (×5): qty 6

## 2018-09-18 MED ORDER — ATORVASTATIN CALCIUM 20 MG PO TABS
20.0000 mg | ORAL_TABLET | Freq: Every day | ORAL | Status: DC
  Administered 2018-09-18 – 2018-09-19 (×2): 20 mg via ORAL
  Filled 2018-09-18 (×2): qty 1

## 2018-09-18 MED ORDER — RIVAROXABAN 20 MG PO TABS
20.0000 mg | ORAL_TABLET | Freq: Every day | ORAL | Status: DC
  Administered 2018-09-18 – 2018-09-20 (×3): 20 mg via ORAL
  Filled 2018-09-18 (×3): qty 1

## 2018-09-18 MED ORDER — SODIUM CHLORIDE 0.9% FLUSH
3.0000 mL | Freq: Two times a day (BID) | INTRAVENOUS | Status: DC
  Administered 2018-09-18 – 2018-09-20 (×5): 3 mL via INTRAVENOUS

## 2018-09-18 MED ORDER — CARVEDILOL 12.5 MG PO TABS
25.0000 mg | ORAL_TABLET | Freq: Two times a day (BID) | ORAL | Status: DC
  Administered 2018-09-18 (×2): 25 mg via ORAL
  Filled 2018-09-18 (×3): qty 2

## 2018-09-18 MED ORDER — TAMSULOSIN HCL 0.4 MG PO CAPS
0.4000 mg | ORAL_CAPSULE | Freq: Every day | ORAL | Status: DC
  Administered 2018-09-18 – 2018-09-19 (×2): 0.4 mg via ORAL
  Filled 2018-09-18 (×2): qty 1

## 2018-09-18 MED ORDER — SPIRONOLACTONE 25 MG PO TABS
100.0000 mg | ORAL_TABLET | Freq: Every day | ORAL | Status: DC
  Administered 2018-09-18 – 2018-09-20 (×3): 100 mg via ORAL
  Filled 2018-09-18 (×3): qty 4

## 2018-09-18 MED ORDER — ACETAMINOPHEN 325 MG PO TABS
650.0000 mg | ORAL_TABLET | ORAL | Status: DC | PRN
  Administered 2018-09-18: 650 mg via ORAL
  Filled 2018-09-18: qty 2

## 2018-09-18 MED ORDER — SODIUM CHLORIDE 0.9% FLUSH: 3.0000 mL | INTRAVENOUS | Status: DC | PRN

## 2018-09-18 MED ORDER — ONDANSETRON HCL 4 MG/2ML IJ SOLN: 4.0000 mg | Freq: Four times a day (QID) | INTRAMUSCULAR | Status: DC | PRN

## 2018-09-18 MED ORDER — PANTOPRAZOLE SODIUM 40 MG PO TBEC
40.0000 mg | DELAYED_RELEASE_TABLET | Freq: Every evening | ORAL | Status: DC
  Administered 2018-09-18 – 2018-09-19 (×2): 40 mg via ORAL
  Filled 2018-09-18 (×2): qty 1

## 2018-09-18 MED ORDER — FUROSEMIDE 10 MG/ML IJ SOLN
80.0000 mg | Freq: Once | INTRAMUSCULAR | Status: AC
  Administered 2018-09-18: 80 mg via INTRAVENOUS
  Filled 2018-09-18: qty 8

## 2018-09-18 MED ORDER — SODIUM CHLORIDE 0.9 % IV SOLN: 250.0000 mL | INTRAVENOUS | Status: DC | PRN

## 2018-09-18 NOTE — ED Notes (Signed)
CRITICAL VALUE ALERT  Critical Value:  Trop 0.03  Date & Time Notied:  09/18/2018 0259  Provider Notified: Dr. [name]  Orders Received/Actions taken: see chart

## 2018-09-18 NOTE — ED Provider Notes (Signed)
[facility] EMERGENCY DEPARTMENT Provider Note   CSN: [id number] Arrival date & time: 09/18/18  0150    History   Chief Complaint Chief Complaint  Patient presents with  . Shortness of Breath    HPI [name] is a 53 y.o. male.  The history is provided by the patient.  Shortness of Breath  He has history of hypertension, hyperlipidemia, coronary artery disease, atrial fibrillation, combined systolic and diastolic heart failure, chronic kidney disease and comes in with increasing difficulty breathing over the last 2 days.  Dyspnea is worse with exertion and with laying flat.  He denies chest pain, heaviness, tightness, pressure.  He denies cough.  He denies fever or chills.  There has been no nausea, vomiting.  He does admit to about a 5 pound weight gain over the last week.  He denies excess sodium ingestion.  Past Medical History:  Diagnosis Date  . Allergic rhinitis   . Asthma   . Atrial fibrillation (HCC)   . CHF (congestive heart failure) (HCC)   . Essential hypertension   . Gastroesophageal reflux disease   . Heart attack (HCC)   . History of cardiac catheterization    Normal coronaries December 2016  . Noncompliance    Major problem leading to declining health and recurrent hospitalization  . Nonischemic cardiomyopathy (HCC)    a. LVEF 20-25% in 2017 with cath in 2016 showing normal cors. b. EF 55% by echo in 03/2018.  . On home O2    3L N/C  . OSA (obstructive sleep apnea)   . Osteoarthritis   . Peptic ulcer disease     Patient Active Problem List   Diagnosis Date Noted  . Acute on chronic combined systolic and diastolic congestive heart failure (HCC) 08/15/2018  . SOB (shortness of breath) 08/03/2018  . Volume overload 07/22/2018  . Acute systolic CHF (congestive heart failure) (HCC) 07/04/2018  . Acute on chronic combined systolic and diastolic heart failure (HCC) 04/01/2018  . Hypomagnesemia 04/01/2018  . Acute on chronic diastolic CHF (congestive  heart failure) (HCC) 03/11/2018  . Pressure injury of skin 03/11/2018  . CHF (congestive heart failure) (HCC) 09/08/2017  . Altered mental status 05/11/2017  . Acute exacerbation of CHF (congestive heart failure) (HCC) 04/20/2017  . Acute on chronic systolic (congestive) heart failure (HCC) 04/19/2017  . CHF exacerbation (HCC) 04/11/2017  . Chronic respiratory failure with hypoxia (HCC) 04/11/2017  . Encounter for hospice care discussion   . Palliative care encounter   . Goals of care, counseling/discussion   . DNR (do not resuscitate)   . DNR (do not resuscitate) discussion   . Chronic congestive heart failure (HCC) 02/18/2017  . Acute systolic heart failure (HCC) 01/18/2017  . Pedal edema 12/02/2016  . Acute CHF (congestive heart failure) (HCC) 11/09/2016  . Acute on chronic systolic CHF (congestive heart failure) (HCC) 08/19/2016  . CKD (chronic kidney disease), stage II 05/17/2016  . Coronary arteries, normal Dec 2016 01/23/2016  . Chronic anticoagulation-Xarelto 01/23/2016  . NSVT (nonsustained ventricular tachycardia) (HCC) 01/23/2016  . Elevated troponin 12/21/2015  . Nonischemic cardiomyopathy (HCC)   . Morbid obesity due to excess calories (HCC)   . Noncompliance with diet and medication regimen 12/02/2015  . OSA (obstructive sleep apnea) 09/20/2015  . Pulmonary hypertension (HCC) 09/19/2015  . Normocytic anemia 09/19/2015  . Atrial fibrillation, chronic   . Dyspnea on exertion 05/28/2015  . Acute respiratory failure with hypoxia (HCC) 04/01/2015  . Hypokalemia 04/01/2015  . Obesity hypoventilation syndrome (  HCC) 08/08/2014  . GERD (gastroesophageal reflux disease) 08/08/2014  . Edema of right lower extremity 06/21/2014  . Fecal urgency 06/21/2014  . Asthma 02/11/2009  . Hyperlipidemia 07/12/2008  . OBESITY, MORBID 07/12/2008  . Anxiety state 07/12/2008  . DEPRESSION 07/12/2008  . Essential hypertension 07/12/2008  . ALLERGIC RHINITIS 07/12/2008  . Peptic ulcer  07/12/2008  . OSTEOARTHRITIS 07/12/2008    Past Surgical History:  Procedure Laterality Date  . CARDIAC CATHETERIZATION N/A 06/14/2015   Procedure: Right/Left Heart Cath and Coronary Angiography;  Surgeon: Runell Gess, MD;  Location: [facility] INVASIVE CV LAB;  Service: Cardiovascular;  Laterality: N/A;        Home Medications    Prior to Admission medications   Medication Sig Start Date End Date Taking? Authorizing Provider  albuterol (PROVENTIL) (2.5 MG/3ML) 0.083% nebulizer solution Take 2.5 mg by nebulization every 6 (six) hours as needed for wheezing or shortness of breath.    [provider]  atorvastatin (LIPITOR) 20 MG tablet Take 1 tablet (20 mg total) by mouth daily at 6 PM for 30 days. 08/18/18 09/17/18  Sherryll Burger, Pratik D, DO  bumetanide (BUMEX) 2 MG tablet Take 1 tablet (2 mg total) by mouth 2 (two) times daily. 09/03/18   Catarina Hartshorn, MD  carvedilol (COREG) 25 MG tablet Take 1 tablet (25 mg total) by mouth 2 (two) times daily with a meal for 30 days. 08/18/18 09/17/18  Sherryll Burger, Pratik D, DO  docusate sodium (COLACE) 100 MG capsule Take 1 capsule (100 mg total) by mouth 2 (two) times daily. 06/25/16   Kathlen Mody, MD  ferrous gluconate (FERGON) 324 MG tablet Take 1 tablet (324 mg total) by mouth 2 (two) times daily with a meal. 06/25/16   Kathlen Mody, MD  fluticasone (FLOVENT HFA) 220 MCG/ACT inhaler Inhale 2 puffs into the lungs 2 (two) times daily as needed (for shortness of breath).     [provider]  lisinopril (PRINIVIL,ZESTRIL) 5 MG tablet Take 1 tablet (5 mg total) by mouth daily. 09/03/18   Catarina Hartshorn, MD  OXYGEN Inhale 3.5 L into the lungs daily.    [provider]  pantoprazole (PROTONIX) 40 MG tablet Take 1 tablet (40 mg total) by mouth daily at 6 (six) AM. Patient taking differently: Take 40 mg by mouth every evening.  06/26/16   Kathlen Mody, MD  potassium chloride SA (K-DUR,KLOR-CON) 20 MEQ tablet Take 1 tablet (20 mEq total) by mouth 2 (two) times  daily. 06/13/18   Sherryll Burger, Pratik D, DO  rivaroxaban (XARELTO) 20 MG TABS tablet Take 1 tablet (20 mg total) by mouth daily with breakfast. Patient taking differently: Take 20 mg by mouth daily with supper.  06/26/16   Kathlen Mody, MD  spironolactone (ALDACTONE) 100 MG tablet Take 100 mg by mouth daily. For fluid 03/18/17   [provider]  tamsulosin (FLOMAX) 0.4 MG CAPS capsule Take 1 capsule (0.4 mg total) by mouth daily. 06/25/16   Kathlen Mody, MD  potassium chloride 20 MEQ TBCR Take 20 mEq by mouth 2 (two) times daily. 09/03/15 09/03/15  Ward, Layla Maw, DO    Family History Family History  Problem Relation Age of Onset  . Stroke Father   . Heart attack Father   . Aneurysm Mother        Cerebral aneurysm  . Hypertension Sister   . Colon cancer Neg Hx   . Inflammatory bowel disease Neg Hx   . Liver disease Neg Hx     Social History  Social History   Tobacco Use  . Smoking status: Former Smoker    Packs/day: 0.50    Years: 20.00    Pack years: 10.00    Types: Cigarettes    Start date: 04/26/1988    Last attempt to quit: 06/23/2007    Years since quitting: 11.2  . Smokeless tobacco: Never Used  . Tobacco comment: 1 ppd former smoker  Substance Use Topics  . Alcohol use: No    Alcohol/week: 0.0 standard drinks    Comment: No etoh since 2009  . Drug use: No     Allergies   Banana; Bee venom; Food; Aspirin; Metolazone; Oatmeal; Orange juice [orange oil]; Torsemide; Diltiazem; Hydralazine; and Lipitor [atorvastatin]   Review of Systems Review of Systems  Respiratory: Positive for shortness of breath.   All other systems reviewed and are negative.    Physical Exam Updated Vital Signs BP (!) 158/86   Pulse 93   Temp 98.9 F (37.2 C) (Oral)   Resp 19   Ht 6' (1.829 m)   Wt (!) 172.8 kg   SpO2 100%   BMI 51.67 kg/m   Physical Exam Vitals signs and nursing note reviewed.    Morbidly obese 53 year old male, resting comfortably and in no acute distress.  Vital signs are significant for elevated blood pressure. Oxygen saturation is 100%, which is normal. Head is normocephalic and atraumatic. PERRLA, EOMI. Oropharynx is clear. Neck is nontender and supple without adenopathy or JVD. Back is nontender and there is no CVA tenderness. Lungs have distant breath sounds without rales, wheezes, or rhonchi. Chest is nontender. Heart has regular rate and rhythm without murmur. Abdomen is soft, flat, nontender without masses or hepatosplenomegaly and peristalsis is normoactive. Extremities have 2+ edema, full range of motion is present. Skin is warm and dry without rash. Neurologic: Mental status is normal, cranial nerves are intact, there are no motor or sensory deficits.  ED Treatments / Results  Labs (all labs ordered are listed, but only abnormal results are displayed) Labs Reviewed  BASIC METABOLIC PANEL - Abnormal; Notable for the following components:      Result Value   Glucose, Bld 107 (*)    Creatinine, Ser 1.51 (*)    Calcium 8.6 (*)    GFR calc non Af Amer 53 (*)    All other components within normal limits  BRAIN NATRIURETIC PEPTIDE - Abnormal; Notable for the following components:   B Natriuretic Peptide 256.0 (*)    All other components within normal limits  TROPONIN I - Abnormal; Notable for the following components:   Troponin I 0.03 (*)    All other components within normal limits  CBC WITH DIFFERENTIAL/PLATELET - Abnormal; Notable for the following components:   Hemoglobin 12.8 (*)    All other components within normal limits    EKG EKG Interpretation  Date/Time:  Sunday September 18 2018 01:56:09 EDT Ventricular Rate:  87 PR Interval:    QRS Duration: 91 QT Interval:  351 QTC Calculation: 423 R Axis:   54 Text Interpretation:  Atrial fibrillation Premature ventricular complexes Borderline T abnormalities, diffuse leads When compared with ECG of 08/31/2018, QT has shortened Confirmed by [name] ([id number]) on 09/18/2018  2:17:08 AM   Radiology Dg Chest Port 1 View  Result Date: 09/18/2018 CLINICAL DATA:  Shortness of breath EXAM: PORTABLE CHEST 1 VIEW COMPARISON:  08/31/2018 FINDINGS: Underpenetrated AP portable examination. Gross cardiomegaly similar prior examination. Pulmonary vascular prominence without overt edema or obvious pleural effusions.  No focal airspace opacity. IMPRESSION: Underpenetrated AP portable examination. Gross cardiomegaly similar prior examination. Pulmonary vascular prominence without overt edema or obvious pleural effusions. No focal airspace opacity. Electronically Signed   By: Lauralyn Primes M.D.   On: 09/18/2018 02:43   Procedures Procedures  Medications Ordered in ED Medications  furosemide (LASIX) injection 80 mg (80 mg Intravenous Given 09/18/18 0310)   Initial Impression / Assessment and Plan / ED Course  I have reviewed the triage vital signs and the nursing notes.  Pertinent labs & imaging results that were available during my care of the patient were reviewed by me and considered in my medical decision making (see chart for details).  Difficulty breathing and patient with known history of heart failure.  In setting of weight gain, this most likely is exacerbation of known heart failure.  He is anticoagulated, so pulmonary embolism is very unlikely.  No cough or fever to suggest respiratory tract infection.  Old records are reviewed, and he has numerous ED visits and hospitalizations for heart failure.  He was discharged from the hospital about 2 weeks ago following a hospitalization.  He has had 4 hospitalizations over the last 2 months, all for heart failure.  Chest x-ray is essentially unchanged from most recent x-ray.  Labs show slight increase in creatinine over baseline.  BNP is essentially unchanged from recent values.  Mild anemia is present which is actually improved over baseline.  Troponin is minimally elevated, but this is chronic for the patient.  He was given a dose  of furosemide and had good diuresis.  However, he states that he is feeling very weak and this is unusual for him.  He was felt to be too unstable to be discharged.  Case is discussed with Dr. Onalee Hua of [facility], who agrees to admit the patient.  Final Clinical Impressions(s) / ED Diagnoses   Final diagnoses:  Acute on chronic combined systolic and diastolic heart failure (HCC)  Acute kidney injury (nontraumatic) (HCC)  Chronic atrial fibrillation  Chronic anticoagulation  Normochromic normocytic anemia  Elevated troponin I level    ED Discharge Orders    None       [name], MD 09/18/18 [phone]

## 2018-09-18 NOTE — Progress Notes (Addendum)
Per HPI: [name] is a 53 y.o. male with medical history significant of chf ef 50% with freq admissions for chf exac, also h/o obesity, afib, noncompliance comes in with a week of wt gain and 2 days of sob.  No chest pain.  No fevers.  No cough.  On 3.5 liters [place] at home.  Pt referred for admission with report from dr [name] pt was too weak to ambulate.  Pt currently ambulating in his room on floor.  Patient was frequently noted to be noncompliant with his diet as well as medications and was recently discharged on 3/13 with discharge weight at that time of 369 pounds.  He had an office telephone encounter on 3/25 and was noted to weigh 378 pounds at that time and stated that he was only taking his Bumex once a day as opposed to twice a day due to frequent urination and was encouraged to take it twice daily.  His current weight is 381 pounds.  Will maintain on Lasix 60 mg IV twice daily as ordered and anticipate several days of admission to bring him back to his dry weight.  No need to repeat echocardiogram at this time.  He is currently on his home usual 3.5 L nasal cannula.  Will repeat labs in a.m.  Restart home Xarelto for paroxysmal atrial fibrillation, which unfortunately was not started on admission along with Coreg.  Discontinue SCDs.

## 2018-09-18 NOTE — H&P (Signed)
History and Physical    [name] VZD:[id number] DOB: 1965/11/11 DOA: 09/18/2018  PCP: Pearson Grippe, MD  Patient coming from:  home  Chief Complaint:  swelling  HPI: [name] is a 53 y.o. male with medical history significant of chf ef 20% with freq admissions for chf exac, also h/o obesity, afib, noncompliance comes in with a week of wt gain and 2 days of sob.  No chest pain.  No fevers.  No cough.  On 3.5 liters [place] at home.  Pt referred for admission with report from dr [name] pt was too weak to ambulate.  Pt currently ambulating in his room on floor.  Review of Systems: As per HPI otherwise 10 point review of systems negative.   Past Medical History:  Diagnosis Date  . Allergic rhinitis   . Asthma   . Atrial fibrillation (HCC)   . CHF (congestive heart failure) (HCC)   . Essential hypertension   . Gastroesophageal reflux disease   . Heart attack (HCC)   . History of cardiac catheterization    Normal coronaries December 2016  . Noncompliance    Major problem leading to declining health and recurrent hospitalization  . Nonischemic cardiomyopathy (HCC)    a. LVEF 20-25% in 2017 with cath in 2016 showing normal cors. b. EF 55% by echo in 03/2018.  . On home O2    3L N/C  . OSA (obstructive sleep apnea)   . Osteoarthritis   . Peptic ulcer disease     Past Surgical History:  Procedure Laterality Date  . CARDIAC CATHETERIZATION N/A 06/14/2015   Procedure: Right/Left Heart Cath and Coronary Angiography;  Surgeon: Runell Gess, MD;  Location: [facility] INVASIVE CV LAB;  Service: Cardiovascular;  Laterality: N/A;     reports that he quit smoking about 11 years ago. His smoking use included cigarettes. He started smoking about 30 years ago. He has a 10.00 pack-year smoking history. He has never used smokeless tobacco. He reports that he does not drink alcohol or use drugs.  Allergies  Allergen Reactions  . Banana Shortness Of Breath  . Bee Venom Shortness Of Breath, Swelling  and Other (See Comments)    Reaction:  Facial swelling  . Food Shortness Of Breath, Rash and Other (See Comments)    Pt states that he is allergic to strawberries.    . Aspirin Other (See Comments)    Reaction:  GI upset   . Metolazone Other (See Comments)    Pt states that he stopped taking this med due to heart attack like symptoms.   . Oatmeal Nausea And Vomiting  . Orange Juice [Orange Oil] Nausea And Vomiting    All acidic products make him nauseous and upset stomach  . Torsemide Swelling and Other (See Comments)    Reaction:  Swelling of feet/legs   . Diltiazem Palpitations  . Hydralazine Palpitations  . Lipitor [Atorvastatin] Other (See Comments)    Reaction:  Nose bleeds     Family History  Problem Relation Age of Onset  . Stroke Father   . Heart attack Father   . Aneurysm Mother        Cerebral aneurysm  . Hypertension Sister   . Colon cancer Neg Hx   . Inflammatory bowel disease Neg Hx   . Liver disease Neg Hx     Prior to Admission medications   Medication Sig Start Date End Date Taking? Authorizing Provider  albuterol (PROVENTIL) (2.5 MG/3ML) 0.083% nebulizer solution Take 2.5  mg by nebulization every 6 (six) hours as needed for wheezing or shortness of breath.    [provider]  atorvastatin (LIPITOR) 20 MG tablet Take 1 tablet (20 mg total) by mouth daily at 6 PM for 30 days. 08/18/18 09/17/18  Sherryll Burger, Pratik D, DO  bumetanide (BUMEX) 2 MG tablet Take 1 tablet (2 mg total) by mouth 2 (two) times daily. 09/03/18   Catarina Hartshorn, MD  carvedilol (COREG) 25 MG tablet Take 1 tablet (25 mg total) by mouth 2 (two) times daily with a meal for 30 days. 08/18/18 09/17/18  Sherryll Burger, Pratik D, DO  docusate sodium (COLACE) 100 MG capsule Take 1 capsule (100 mg total) by mouth 2 (two) times daily. 06/25/16   Kathlen Mody, MD  ferrous gluconate (FERGON) 324 MG tablet Take 1 tablet (324 mg total) by mouth 2 (two) times daily with a meal. 06/25/16   Kathlen Mody, MD  fluticasone  (FLOVENT HFA) 220 MCG/ACT inhaler Inhale 2 puffs into the lungs 2 (two) times daily as needed (for shortness of breath).     [provider]  lisinopril (PRINIVIL,ZESTRIL) 5 MG tablet Take 1 tablet (5 mg total) by mouth daily. 09/03/18   Catarina Hartshorn, MD  OXYGEN Inhale 3.5 L into the lungs daily.    [provider]  pantoprazole (PROTONIX) 40 MG tablet Take 1 tablet (40 mg total) by mouth daily at 6 (six) AM. Patient taking differently: Take 40 mg by mouth every evening.  06/26/16   Kathlen Mody, MD  potassium chloride SA (K-DUR,KLOR-CON) 20 MEQ tablet Take 1 tablet (20 mEq total) by mouth 2 (two) times daily. 06/13/18   Sherryll Burger, Pratik D, DO  rivaroxaban (XARELTO) 20 MG TABS tablet Take 1 tablet (20 mg total) by mouth daily with breakfast. Patient taking differently: Take 20 mg by mouth daily with supper.  06/26/16   Kathlen Mody, MD  spironolactone (ALDACTONE) 100 MG tablet Take 100 mg by mouth daily. For fluid 03/18/17   [provider]  tamsulosin (FLOMAX) 0.4 MG CAPS capsule Take 1 capsule (0.4 mg total) by mouth daily. 06/25/16   Kathlen Mody, MD  potassium chloride 20 MEQ TBCR Take 20 mEq by mouth 2 (two) times daily. 09/03/15 09/03/15  Ward, Layla Maw, DO    Physical Exam: Vitals:   09/18/18 0207 09/18/18 0300 09/18/18 0330 09/18/18 0400  BP:  136/84 (!) 158/114   Pulse:  92 92 (!) 108  Resp:  19    Temp:      TempSrc:      SpO2: 100% 98% 97% 100%  Weight:      Height:          Constitutional: NAD, calm, comfortable Vitals:   09/18/18 0207 09/18/18 0300 09/18/18 0330 09/18/18 0400  BP:  136/84 (!) 158/114   Pulse:  92 92 (!) 108  Resp:  19    Temp:      TempSrc:      SpO2: 100% 98% 97% 100%  Weight:      Height:       Eyes: PERRL, lids and conjunctivae normal ENMT: Mucous membranes are moist. Posterior pharynx clear of any exudate or lesions.Normal dentition.  Neck: normal, supple, no masses, no thyromegaly Respiratory: clear to auscultation  bilaterally, no wheezing, no crackles. Normal respiratory effort. No accessory muscle use.  Cardiovascular: Regular rate and rhythm, no murmurs / rubs / gallops. 3+ extremity edema. 2+ pedal pulses. No carotid bruits.  Abdomen: no tenderness, no masses palpated. No hepatosplenomegaly.  Bowel sounds positive.  Musculoskeletal: no clubbing / cyanosis. No joint deformity upper and lower extremities. Good ROM, no contractures. Normal muscle tone.  Skin: no rashes, lesions, ulcers. No induration Neurologic: CN 2-12 grossly intact. Sensation intact, DTR normal. Strength 5/5 in all 4.  Psychiatric: Normal judgment and insight. Alert and oriented x 3. Normal mood.    Labs on Admission: I have personally reviewed following labs and imaging studies  CBC: Recent Labs  Lab 09/18/18 0222  WBC 4.4  NEUTROABS 2.6  HGB 12.8*  HCT 39.9  MCV 87.9  PLT 200   Basic Metabolic Panel: Recent Labs  Lab 09/18/18 0222  NA 138  K 3.9  CL 109  CO2 22  GLUCOSE 107*  BUN 18  CREATININE 1.51*  CALCIUM 8.6*   GFR: Estimated Creatinine Clearance: 95.8 mL/min (A) (by C-G formula based on SCr of 1.51 mg/dL (H)). Liver Function Tests: No results for input(s): AST, ALT, ALKPHOS, BILITOT, PROT, ALBUMIN in the last 168 hours. No results for input(s): LIPASE, AMYLASE in the last 168 hours. No results for input(s): AMMONIA in the last 168 hours. Coagulation Profile: No results for input(s): INR, PROTIME in the last 168 hours. Cardiac Enzymes: Recent Labs  Lab 09/18/18 0222  TROPONINI 0.03*   BNP (last 3 results) No results for input(s): PROBNP in the last 8760 hours. HbA1C: No results for input(s): HGBA1C in the last 72 hours. CBG: No results for input(s): GLUCAP in the last 168 hours. Lipid Profile: No results for input(s): CHOL, HDL, LDLCALC, TRIG, CHOLHDL, LDLDIRECT in the last 72 hours. Thyroid Function Tests: No results for input(s): TSH, T4TOTAL, FREET4, T3FREE, THYROIDAB in the last 72 hours.  Anemia Panel: No results for input(s): VITAMINB12, FOLATE, FERRITIN, TIBC, IRON, RETICCTPCT in the last 72 hours. Urine analysis:    Component Value Date/Time   COLORURINE YELLOW 04/01/2018 0332   APPEARANCEUR CLEAR 04/01/2018 0332   LABSPEC 1.008 04/01/2018 0332   PHURINE 7.0 04/01/2018 0332   GLUCOSEU NEGATIVE 04/01/2018 0332   GLUCOSEU NEG mg/dL 86/57/8469 6295   HGBUR NEGATIVE 04/01/2018 0332   BILIRUBINUR NEGATIVE 04/01/2018 0332   KETONESUR NEGATIVE 04/01/2018 0332   PROTEINUR NEGATIVE 04/01/2018 0332   UROBILINOGEN 0.2 08/08/2014 1445   NITRITE NEGATIVE 04/01/2018 0332   LEUKOCYTESUR NEGATIVE 04/01/2018 0332   Sepsis Labs: !!!!!!!!!!!!!!!!!!!!!!!!!!!!!!!!!!!!!!!!!!!! (procalcitonin:4,lacticidven:4) )No results found for this or any previous visit (from the past 240 hour(s)).   Radiological Exams on Admission: Dg Chest Port 1 View  Result Date: 09/18/2018 CLINICAL DATA:  Shortness of breath EXAM: PORTABLE CHEST 1 VIEW COMPARISON:  08/31/2018 FINDINGS: Underpenetrated AP portable examination. Gross cardiomegaly similar prior examination. Pulmonary vascular prominence without overt edema or obvious pleural effusions. No focal airspace opacity. IMPRESSION: Underpenetrated AP portable examination. Gross cardiomegaly similar prior examination. Pulmonary vascular prominence without overt edema or obvious pleural effusions. No focal airspace opacity. Electronically Signed   By: Lauralyn Primes M.D.   On: 09/18/2018 02:43   Old chart reviewed Case discussed with dr [name] in the ed   Assessment/Plan 53 yo male with acute on chronic chf exac  Principal Problem:   Acute on chronic combined systolic and diastolic heart failure (HCC)- lasix  iv q 12 hours.  Will not repeat echo.  10lb wt gain last week per pt report.  o2 sats 100% on his 3 liters [place].  Active Problems:   Essential hypertension- clarify and resume home meds    Pulmonary hypertension (HCC)- stable    CKD  (chronic kidney disease),  stage II- baseline cr 1    AKI (acute kidney injury) (HCC)- bump up to 1.5.  Hold ace.  Diurese.  Watch closely with diuresis     DVT prophylaxis: scds  Code Status: full Family Communication: none Disposition Plan: 1-2 days Consults called: none Admission status: obs   Kenneth Cuaresma A MD [facility]  If 7PM-7AM, please contact night-coverage [email or web address] Password [facility]  09/18/2018, 4:44 AM

## 2018-09-19 ENCOUNTER — Ambulatory Visit: Payer: Self-pay

## 2018-09-19 DIAGNOSIS — I5043 Acute on chronic combined systolic (congestive) and diastolic (congestive) heart failure: Secondary | ICD-10-CM | POA: Diagnosis not present

## 2018-09-19 DIAGNOSIS — Z87891 Personal history of nicotine dependence: Secondary | ICD-10-CM | POA: Diagnosis not present

## 2018-09-19 DIAGNOSIS — I130 Hypertensive heart and chronic kidney disease with heart failure and stage 1 through stage 4 chronic kidney disease, or unspecified chronic kidney disease: Secondary | ICD-10-CM | POA: Diagnosis present

## 2018-09-19 DIAGNOSIS — I480 Paroxysmal atrial fibrillation: Secondary | ICD-10-CM | POA: Diagnosis present

## 2018-09-19 DIAGNOSIS — Z9111 Patient's noncompliance with dietary regimen: Secondary | ICD-10-CM | POA: Diagnosis not present

## 2018-09-19 DIAGNOSIS — Z7901 Long term (current) use of anticoagulants: Secondary | ICD-10-CM | POA: Diagnosis not present

## 2018-09-19 DIAGNOSIS — N182 Chronic kidney disease, stage 2 (mild): Secondary | ICD-10-CM | POA: Diagnosis present

## 2018-09-19 DIAGNOSIS — I252 Old myocardial infarction: Secondary | ICD-10-CM | POA: Diagnosis not present

## 2018-09-19 DIAGNOSIS — N179 Acute kidney failure, unspecified: Secondary | ICD-10-CM | POA: Diagnosis present

## 2018-09-19 DIAGNOSIS — I2510 Atherosclerotic heart disease of native coronary artery without angina pectoris: Secondary | ICD-10-CM | POA: Diagnosis present

## 2018-09-19 DIAGNOSIS — I272 Pulmonary hypertension, unspecified: Secondary | ICD-10-CM | POA: Diagnosis present

## 2018-09-19 DIAGNOSIS — Z9119 Patient's noncompliance with other medical treatment and regimen: Secondary | ICD-10-CM | POA: Diagnosis not present

## 2018-09-19 DIAGNOSIS — N189 Chronic kidney disease, unspecified: Secondary | ICD-10-CM | POA: Diagnosis present

## 2018-09-19 LAB — MAGNESIUM: MAGNESIUM: 1.9 mg/dL (ref 1.7–2.4)

## 2018-09-19 LAB — BASIC METABOLIC PANEL
Anion gap: 9 (ref 5–15)
BUN: 19 mg/dL (ref 6–20)
CO2: 27 mmol/L (ref 22–32)
Calcium: 8.6 mg/dL — ABNORMAL LOW (ref 8.9–10.3)
Chloride: 104 mmol/L (ref 98–111)
Creatinine, Ser: 1.18 mg/dL (ref 0.61–1.24)
GFR calc Af Amer: >60 mL/min (ref >60)
GFR calc non Af Amer: >60 mL/min (ref >60)
Glucose, Bld: 87 mg/dL (ref 70–99)
Potassium: 3.6 mmol/L (ref 3.5–5.1)
Sodium: 140 mmol/L (ref 135–145)

## 2018-09-19 NOTE — Progress Notes (Addendum)
PROGRESS NOTE    [name]  TJQ:[id number] DOB: 1965-11-04 DOA: 09/18/2018 PCP: Pearson Grippe, MD   Brief Narrative:  Per HPI: Samuel Bruins Archeris a 53 y.o.malewith medical history significant ofchf ef 50% with freq admissions for chf exac, also h/o obesity, afib, noncompliance comes in with a week of wt gain and 2 days of sob. No chest pain. No fevers. No cough. On 3.5 liters [place] at home. Pt referred for admission with report from dr [name] pt was too weak to ambulate. Pt currently ambulating in his room on floor.  Patient was frequently noted to be noncompliant with his diet as well as medications and was recently discharged on 3/13 with discharge weight at that time of 369 pounds.  He had an office telephone encounter on 3/25 and was noted to weigh 378 pounds at that time and stated that he was only taking his Bumex once a day as opposed to twice a day due to frequent urination and was encouraged to take it twice daily.  His current weight is 381 pounds.  Will maintain on Lasix 60 mg IV twice daily as ordered and anticipate several days of admission to bring him back to his dry weight.   Assessment & Plan:   Principal Problem:   Acute on chronic combined systolic and diastolic heart failure (HCC) Active Problems:   Essential hypertension   Pulmonary hypertension (HCC)   CKD (chronic kidney disease), stage II   AKI (acute kidney injury) (HCC)   1. Acute on chronic combined systolic and diastolic heart failure exacerbation.  Continue Lasix IV 60 mg twice daily.  His weight on admission was noted to be 381 which appears to be accurate as on recent telephone encounter on 3/25 his weight was noted to be 378 per history.  His weight appears to have come down to 367 pounds, but based on the intake and output measurement of -4 L he should be at approximately 372.  We will continue further diuresis through today as he does not appear to be quite dry.  Emphasized the importance of taking  Bumex twice daily and being compliant with his diet upon discharge. 2. Essential hypertension.  Continue current medications as ordered, but hold Coreg dose based on some bradycardia noted on vitals. 3. Pulmonary hypertension-stable. 4. CKD stage II.  Renal tolerance within normal limits.  Recheck labs in a.m.   DVT prophylaxis: Xarelto Code Status: Full Family Communication: None at bedside Disposition Plan: Continue IV diuresis and monitor weights and intake and output, anticipate discharge in a.m.   Consultants:   None  Procedures:   None  Antimicrobials:   None   Subjective: Patient seen and evaluated today with no new acute complaints or concerns. No acute concerns or events noted overnight.  He states that he seems to be feeling some better and has less edema to his lower extremities.  He is almost back to his baseline and would like to try and go home soon.  Objective: Vitals:   09/18/18 0448 09/18/18 1353 09/18/18 2108 09/19/18 0524  BP: (!) 141/124 127/62 115/73 122/79  Pulse: 92 67 79 (!) 45  Resp: 20 (!) 21 20 20   Temp: 98.2 F (36.8 C) 98.4 F (36.9 C) 98 F (36.7 C) 98.6 F (37 C)  TempSrc: Oral  Oral Oral  SpO2: 98% 98% 99% 100%  Weight:    (!) 166.9 kg  Height:        Intake/Output Summary (Last 24 hours) at 09/19/2018  0740 Last data filed at 09/19/2018 5009 Gross per 24 hour  Intake 840 ml  Output 3550 ml  Net -2710 ml   Filed Weights   09/18/18 0153 09/19/18 0524  Weight: (!) 172.8 kg (!) 166.9 kg    Examination:  General exam: Appears calm and comfortable  Respiratory system: Clear to auscultation. Respiratory effort normal. Cardiovascular system: S1 & S2 heard, RRR. No JVD, murmurs, rubs, gallops or clicks. No pedal edema. Gastrointestinal system: Abdomen is nondistended, soft and nontender. No organomegaly or masses felt. Normal bowel sounds heard. Central nervous system: Alert and oriented. No focal neurological deficits. Extremities:  Symmetric 5 x 5 power. Skin: No rashes, lesions or ulcers Psychiatry: Judgement and insight appear normal. Mood & affect appropriate.     Data Reviewed: I have personally reviewed following labs and imaging studies  CBC: Recent Labs  Lab 09/18/18 0222  WBC 4.4  NEUTROABS 2.6  HGB 12.8*  HCT 39.9  MCV 87.9  PLT 200   Basic Metabolic Panel: Recent Labs  Lab 09/18/18 0222 09/19/18 0412  NA 138 140  K 3.9 3.6  CL 109 104  CO2 22 27  GLUCOSE 107* 87  BUN 18 19  CREATININE 1.51* 1.18  CALCIUM 8.6* 8.6*  MG  --  1.9   GFR: Estimated Creatinine Clearance: 120 mL/min (by C-G formula based on SCr of 1.18 mg/dL). Liver Function Tests: No results for input(s): AST, ALT, ALKPHOS, BILITOT, PROT, ALBUMIN in the last 168 hours. No results for input(s): LIPASE, AMYLASE in the last 168 hours. No results for input(s): AMMONIA in the last 168 hours. Coagulation Profile: No results for input(s): INR, PROTIME in the last 168 hours. Cardiac Enzymes: Recent Labs  Lab 09/18/18 0222  TROPONINI 0.03*   BNP (last 3 results) No results for input(s): PROBNP in the last 8760 hours. HbA1C: No results for input(s): HGBA1C in the last 72 hours. CBG: No results for input(s): GLUCAP in the last 168 hours. Lipid Profile: No results for input(s): CHOL, HDL, LDLCALC, TRIG, CHOLHDL, LDLDIRECT in the last 72 hours. Thyroid Function Tests: No results for input(s): TSH, T4TOTAL, FREET4, T3FREE, THYROIDAB in the last 72 hours. Anemia Panel: No results for input(s): VITAMINB12, FOLATE, FERRITIN, TIBC, IRON, RETICCTPCT in the last 72 hours. Sepsis Labs: No results for input(s): PROCALCITON, LATICACIDVEN in the last 168 hours.  No results found for this or any previous visit (from the past 240 hour(s)).       Radiology Studies: Dg Chest Port 1 View  Result Date: 09/18/2018 CLINICAL DATA:  Shortness of breath EXAM: PORTABLE CHEST 1 VIEW COMPARISON:  08/31/2018 FINDINGS: Underpenetrated AP  portable examination. Gross cardiomegaly similar prior examination. Pulmonary vascular prominence without overt edema or obvious pleural effusions. No focal airspace opacity. IMPRESSION: Underpenetrated AP portable examination. Gross cardiomegaly similar prior examination. Pulmonary vascular prominence without overt edema or obvious pleural effusions. No focal airspace opacity. Electronically Signed   By: Lauralyn Primes M.D.   On: 09/18/2018 02:43        Scheduled Meds: . atorvastatin  20 mg Oral q1800  . carvedilol  25 mg Oral BID WC  . furosemide  60 mg Intravenous Q12H  . pantoprazole  40 mg Oral QPM  . rivaroxaban  20 mg Oral Q breakfast  . sodium chloride flush  3 mL Intravenous Q12H  . spironolactone  100 mg Oral Daily  . tamsulosin  0.4 mg Oral Daily   Continuous Infusions: . sodium chloride  LOS: 0 days    Time spent: 30 minutes    Emojean Gertz Hoover Brunette, DO [facility] Pager [phone]  If 7PM-7AM, please contact night-coverage [email or web address] Password [email or web address] 09/19/2018, 7:40 AM

## 2018-09-19 NOTE — Care Management Obs Status (Signed)
MEDICARE OBSERVATION STATUS NOTIFICATION   Patient Details  Name: DEVAL MCFARREN MRN: [id number] Date of Birth: Jan 31, 1966   Medicare Observation Status Notification Given:  Yes    Corey Harold 09/19/2018, 1:21 PM

## 2018-09-20 LAB — BASIC METABOLIC PANEL
Anion gap: 8 (ref 5–15)
BUN: 20 mg/dL (ref 6–20)
CHLORIDE: 104 mmol/L (ref 98–111)
CO2: 27 mmol/L (ref 22–32)
Calcium: 8.7 mg/dL — ABNORMAL LOW (ref 8.9–10.3)
Creatinine, Ser: 1.21 mg/dL (ref 0.61–1.24)
GFR calc Af Amer: >60 mL/min (ref >60)
GFR calc non Af Amer: >60 mL/min (ref >60)
Glucose, Bld: 89 mg/dL (ref 70–99)
POTASSIUM: 3.3 mmol/L — AB (ref 3.5–5.1)
Sodium: 139 mmol/L (ref 135–145)

## 2018-09-20 LAB — MAGNESIUM: MAGNESIUM: 2.1 mg/dL (ref 1.7–2.4)

## 2018-09-20 MED ORDER — POTASSIUM CHLORIDE CRYS ER 20 MEQ PO TBCR
40.0000 meq | EXTENDED_RELEASE_TABLET | Freq: Once | ORAL | Status: AC
  Administered 2018-09-20: 40 meq via ORAL
  Filled 2018-09-20: qty 2

## 2018-09-20 NOTE — Discharge Summary (Signed)
Physician Discharge Summary  GERMAIN VANKOOTEN EVO:[id number] DOB: Mar 16, 1966 DOA: 09/18/2018  PCP: Pearson Grippe, MD  Admit date: 09/18/2018  Discharge date: 09/20/2018  Admitted From:Home  Disposition:  Home  Recommendations for Outpatient Follow-up:  1. Follow up with PCP in 1-2 weeks 2. Encouraged patient to remain compliant with his home medications, namely Bumex twice daily 3. Repeat BMP in one week  Home Health:None  Equipment/Devices:None  Discharge Condition:  CODE STATUS: Full  Diet recommendation: Heart Healthy  Brief/Interim Summary: Per HPI: Samuel MOLYNEAUX is a 53 y.o. male with medical history significant of chf ef 20% with freq admissions for chf exac, also h/o obesity, afib, noncompliance comes in with a week of wt gain and 2 days of sob.  No chest pain.  No fevers.  No cough.  On 3.5 liters [place] at home.  Pt referred for admission with report from dr [name] pt was too weak to ambulate.  Pt currently ambulating in his room on floor.  Patient was admitted for recurrent acute on chronic combined systolic and diastolic heart failure and has diuresed a total of 6.7 L during this hospitalization.  His discharge weight has come down to 359 pounds and was noted to be 381 pounds on admission.  His baseline weight appears to be somewhere near 360-365 pounds.  He is very noncompliant with his diet and does not take his Bumex twice a day as prescribed as he frequently urinates and feels that he has too much medication.  He will need close follow-up in 1 week with repeat BMP and weight check as well as follow-up with his cardiologist as scheduled.  No other acute events noted during this brief course of admission.  Discharge Diagnoses:  Principal Problem:   Acute on chronic combined systolic and diastolic heart failure (HCC) Active Problems:   Essential hypertension   Pulmonary hypertension (HCC)   CKD (chronic kidney disease), stage II   Acute exacerbation of CHF (congestive heart  failure) (HCC)   AKI (acute kidney injury) (HCC)  Principle discharge diagnosis: Acute on chronic combined systolic and diastolic heart failure.  Discharge Instructions  Discharge Instructions    Diet - low sodium heart healthy   Complete by:  As directed    Increase activity slowly   Complete by:  As directed      Allergies as of 09/20/2018      Reactions   Banana Shortness Of Breath   Bee Venom Shortness Of Breath, Swelling, Other (See Comments)   Reaction:  Facial swelling   Food Shortness Of Breath, Rash, Other (See Comments)   Pt states that he is allergic to strawberries.     Aspirin Other (See Comments)   Reaction:  GI upset    Metolazone Other (See Comments)   Pt states that he stopped taking this med due to heart attack like symptoms.    Oatmeal Nausea And Vomiting   Orange Juice [orange Oil] Nausea And Vomiting   All acidic products make him nauseous and upset stomach   Torsemide Swelling, Other (See Comments)   Reaction:  Swelling of feet/legs    Diltiazem Palpitations   Hydralazine Palpitations   Lipitor [atorvastatin] Other (See Comments)   Reaction:  Nose bleeds       Medication List    TAKE these medications   albuterol (2.5 MG/3ML) 0.083% nebulizer solution Commonly known as:  PROVENTIL Take 2.5 mg by nebulization every 6 (six) hours as needed for wheezing or shortness of breath.  atorvastatin 40 MG tablet Commonly known as:  LIPITOR Take 1 tablet by mouth daily.   bumetanide 2 MG tablet Commonly known as:  BUMEX Take 1 tablet (2 mg total) by mouth 2 (two) times daily.   carvedilol 25 MG tablet Commonly known as:  COREG Take 1 tablet (25 mg total) by mouth 2 (two) times daily with a meal for 30 days.   docusate sodium 100 MG capsule Commonly known as:  COLACE Take 1 capsule (100 mg total) by mouth 2 (two) times daily.   ferrous gluconate 324 MG tablet Commonly known as:  FERGON Take 1 tablet (324 mg total) by mouth 2 (two) times daily with  a meal.   fluticasone 220 MCG/ACT inhaler Commonly known as:  FLOVENT HFA Inhale 2 puffs into the lungs 2 (two) times daily as needed (for shortness of breath).   lisinopril 5 MG tablet Commonly known as:  PRINIVIL,ZESTRIL Take 1 tablet (5 mg total) by mouth daily.   OXYGEN Inhale 3.5 L into the lungs daily.   pantoprazole 40 MG tablet Commonly known as:  PROTONIX Take 1 tablet (40 mg total) by mouth daily at 6 (six) AM. What changed:  when to take this   potassium chloride SA 20 MEQ tablet Commonly known as:  K-DUR,KLOR-CON Take 1 tablet (20 mEq total) by mouth 2 (two) times daily.   rivaroxaban 20 MG Tabs tablet Commonly known as:  XARELTO Take 1 tablet (20 mg total) by mouth daily with breakfast. What changed:  when to take this   spironolactone 100 MG tablet Commonly known as:  ALDACTONE Take 100 mg by mouth daily. For fluid   tamsulosin 0.4 MG Caps capsule Commonly known as:  FLOMAX Take 1 capsule (0.4 mg total) by mouth daily.      Follow-up Information    Pearson Grippe, MD Follow up in 1 week(s).   Specialty:  Internal Medicine Contact information: [address] [place] [address] [phone]        [name], MD .   Specialty:  Cardiology Contact information: [address] [place] [address] [phone]          Allergies  Allergen Reactions  . Banana Shortness Of Breath  . Bee Venom Shortness Of Breath, Swelling and Other (See Comments)    Reaction:  Facial swelling  . Food Shortness Of Breath, Rash and Other (See Comments)    Pt states that he is allergic to strawberries.    . Aspirin Other (See Comments)    Reaction:  GI upset   . Metolazone Other (See Comments)    Pt states that he stopped taking this med due to heart attack like symptoms.   . Oatmeal Nausea And Vomiting  . Orange Juice [Orange Oil] Nausea And Vomiting    All acidic products make him nauseous and upset stomach  . Torsemide Swelling and  Other (See Comments)    Reaction:  Swelling of feet/legs   . Diltiazem Palpitations  . Hydralazine Palpitations  . Lipitor [Atorvastatin] Other (See Comments)    Reaction:  Nose bleeds     Consultations:  None   Procedures/Studies: Dg Chest Port 1 View  Result Date: 09/18/2018 CLINICAL DATA:  Shortness of breath EXAM: PORTABLE CHEST 1 VIEW COMPARISON:  08/31/2018 FINDINGS: Underpenetrated AP portable examination. Gross cardiomegaly similar prior examination. Pulmonary vascular prominence without overt edema or obvious pleural effusions. No focal airspace opacity. IMPRESSION: Underpenetrated AP portable examination. Gross cardiomegaly similar prior examination.  Pulmonary vascular prominence without overt edema or obvious pleural effusions. No focal airspace opacity. Electronically Signed   By: Lauralyn Primes M.D.   On: 09/18/2018 02:43   Dg Chest Port 1 View  Result Date: 08/31/2018 CLINICAL DATA:  Shortness of breath and lower leg swelling for 2 days. Worsening. History of CHF, hypertension, asthma, atrial fibrillation, former smoker. EXAM: PORTABLE CHEST 1 VIEW COMPARISON:  08/15/2018 FINDINGS: Cardiac enlargement with pulmonary vascular congestion. Perihilar infiltration suggesting edema. No blunting of costophrenic angles. No pneumothorax. Mediastinal contours appear intact. IMPRESSION: Cardiac enlargement with pulmonary vascular congestion and perihilar edema. Electronically Signed   By: Burman Nieves M.D.   On: 08/31/2018 01:41     Discharge Exam: Vitals:   09/19/18 2158 09/20/18 0609  BP: (!) 143/98 109/80  Pulse: 88 88  Resp: 20 20  Temp: 97.9 F (36.6 C) 98 F (36.7 C)  SpO2: 100% 97%   Vitals:   09/19/18 1924 09/19/18 2158 09/20/18 0609 09/20/18 0654  BP:  (!) 143/98 109/80   Pulse:  88 88   Resp:  20 20   Temp:  97.9 F (36.6 C) 98 F (36.7 C)   TempSrc:  Oral Oral   SpO2: 93% 100% 97%   Weight:    (!) 162.8 kg  Height:        General: Pt is alert, awake,  not in acute distress Cardiovascular: RRR, S1/S2 +, no rubs, no gallops Respiratory: CTA bilaterally, no wheezing, no rhonchi, on 3.5 L nasal cannula Abdominal: Soft, NT, ND, bowel sounds + Extremities: no edema, no cyanosis    The results of significant diagnostics from this hospitalization (including imaging, microbiology, ancillary and laboratory) are listed below for reference.     Microbiology: No results found for this or any previous visit (from the past 240 hour(s)).   Labs: BNP (last 3 results) Recent Labs    08/15/18 0100 08/31/18 0134 09/18/18 0222  BNP 287.0* 341.0* 256.0*   Basic Metabolic Panel: Recent Labs  Lab 09/18/18 0222 09/19/18 0412 09/20/18 0450  NA 138 140 139  K 3.9 3.6 3.3*  CL 109 104 104  CO2 GLUCOSE 107* 87 89  BUN CREATININE 1.51* 1.18 1.21  CALCIUM 8.6* 8.6* 8.7*  MG  --  1.9 2.1   Liver Function Tests: No results for input(s): AST, ALT, ALKPHOS, BILITOT, PROT, ALBUMIN in the last 168 hours. No results for input(s): LIPASE, AMYLASE in the last 168 hours. No results for input(s): AMMONIA in the last 168 hours. CBC: Recent Labs  Lab 09/18/18 0222  WBC 4.4  NEUTROABS 2.6  HGB 12.8*  HCT 39.9  MCV 87.9  PLT 200   Cardiac Enzymes: Recent Labs  Lab 09/18/18 0222  TROPONINI 0.03*   BNP: Invalid input(s): POCBNP CBG: No results for input(s): GLUCAP in the last 168 hours. D-Dimer No results for input(s): DDIMER in the last 72 hours. Hgb A1c No results for input(s): HGBA1C in the last 72 hours. Lipid Profile No results for input(s): CHOL, HDL, LDLCALC, TRIG, CHOLHDL, LDLDIRECT in the last 72 hours. Thyroid function studies No results for input(s): TSH, T4TOTAL, T3FREE, THYROIDAB in the last 72 hours.  Invalid input(s): FREET3 Anemia work up No results for input(s): VITAMINB12, FOLATE, FERRITIN, TIBC, IRON, RETICCTPCT in the last 72 hours. Urinalysis    Component Value Date/Time   COLORURINE YELLOW  04/01/2018 0332   APPEARANCEUR CLEAR 04/01/2018 0332   LABSPEC 1.008 04/01/2018 0332   PHURINE  7.0 04/01/2018 0332   GLUCOSEU NEGATIVE 04/01/2018 0332   GLUCOSEU NEG mg/dL 56/70/1410 3013   HGBUR NEGATIVE 04/01/2018 0332   BILIRUBINUR NEGATIVE 04/01/2018 0332   KETONESUR NEGATIVE 04/01/2018 0332   PROTEINUR NEGATIVE 04/01/2018 0332   UROBILINOGEN 0.2 08/08/2014 1445   NITRITE NEGATIVE 04/01/2018 0332   LEUKOCYTESUR NEGATIVE 04/01/2018 0332   Sepsis Labs Invalid input(s): PROCALCITONIN,  WBC,  LACTICIDVEN Microbiology No results found for this or any previous visit (from the past 240 hour(s)).   Time coordinating discharge: 35 minutes  SIGNED:   Erick Blinks, DO [facility] 09/20/2018, 7:46 AM  If 7PM-7AM, please contact night-coverage [email or web address] Password [email or web address]

## 2018-09-21 ENCOUNTER — Other Ambulatory Visit: Payer: Self-pay

## 2018-09-21 NOTE — Patient Outreach (Signed)
[facility]) Care Management  09/21/2018  [name] 15-Mar-1966 [id number]     Successful outreach with [name]. Reports feeling well since brief hospitalization over the weekend. Reports experiencing worsening shortness of breath after not taking Bumex on Saturday. Today he denies complaints of shortness of breath or increased edema. States he is ambulating well. Reports taking Bumex at 8am and intends to take next dose at 6pm. Confirmed receipt of new scale. Reports a weight of 362lbs. Discussed medications and diet. States he was noncompliant due to frequent urination. He was strongly encouraged to take diuretics as ordered and consider taking doses earlier in the day if needed. [name] denies urgent concerns. Pending outreach with [facility] clinic within the next week.   PLAN Will follow up on 09/23/18.   Katha Cabal [facility] Community Care Manager [phone]

## 2018-09-23 ENCOUNTER — Other Ambulatory Visit: Payer: Self-pay

## 2018-09-25 IMAGING — DX DG CHEST 2V
2 series · 2 of 2 positions shown · non-contrast
Comparison: 01/09/2017 and 01/07/2017 and 09/05/2016

CLINICAL DATA: Increasing shortness of breath.

EXAM:
CHEST  2 VIEW

[chest pa]
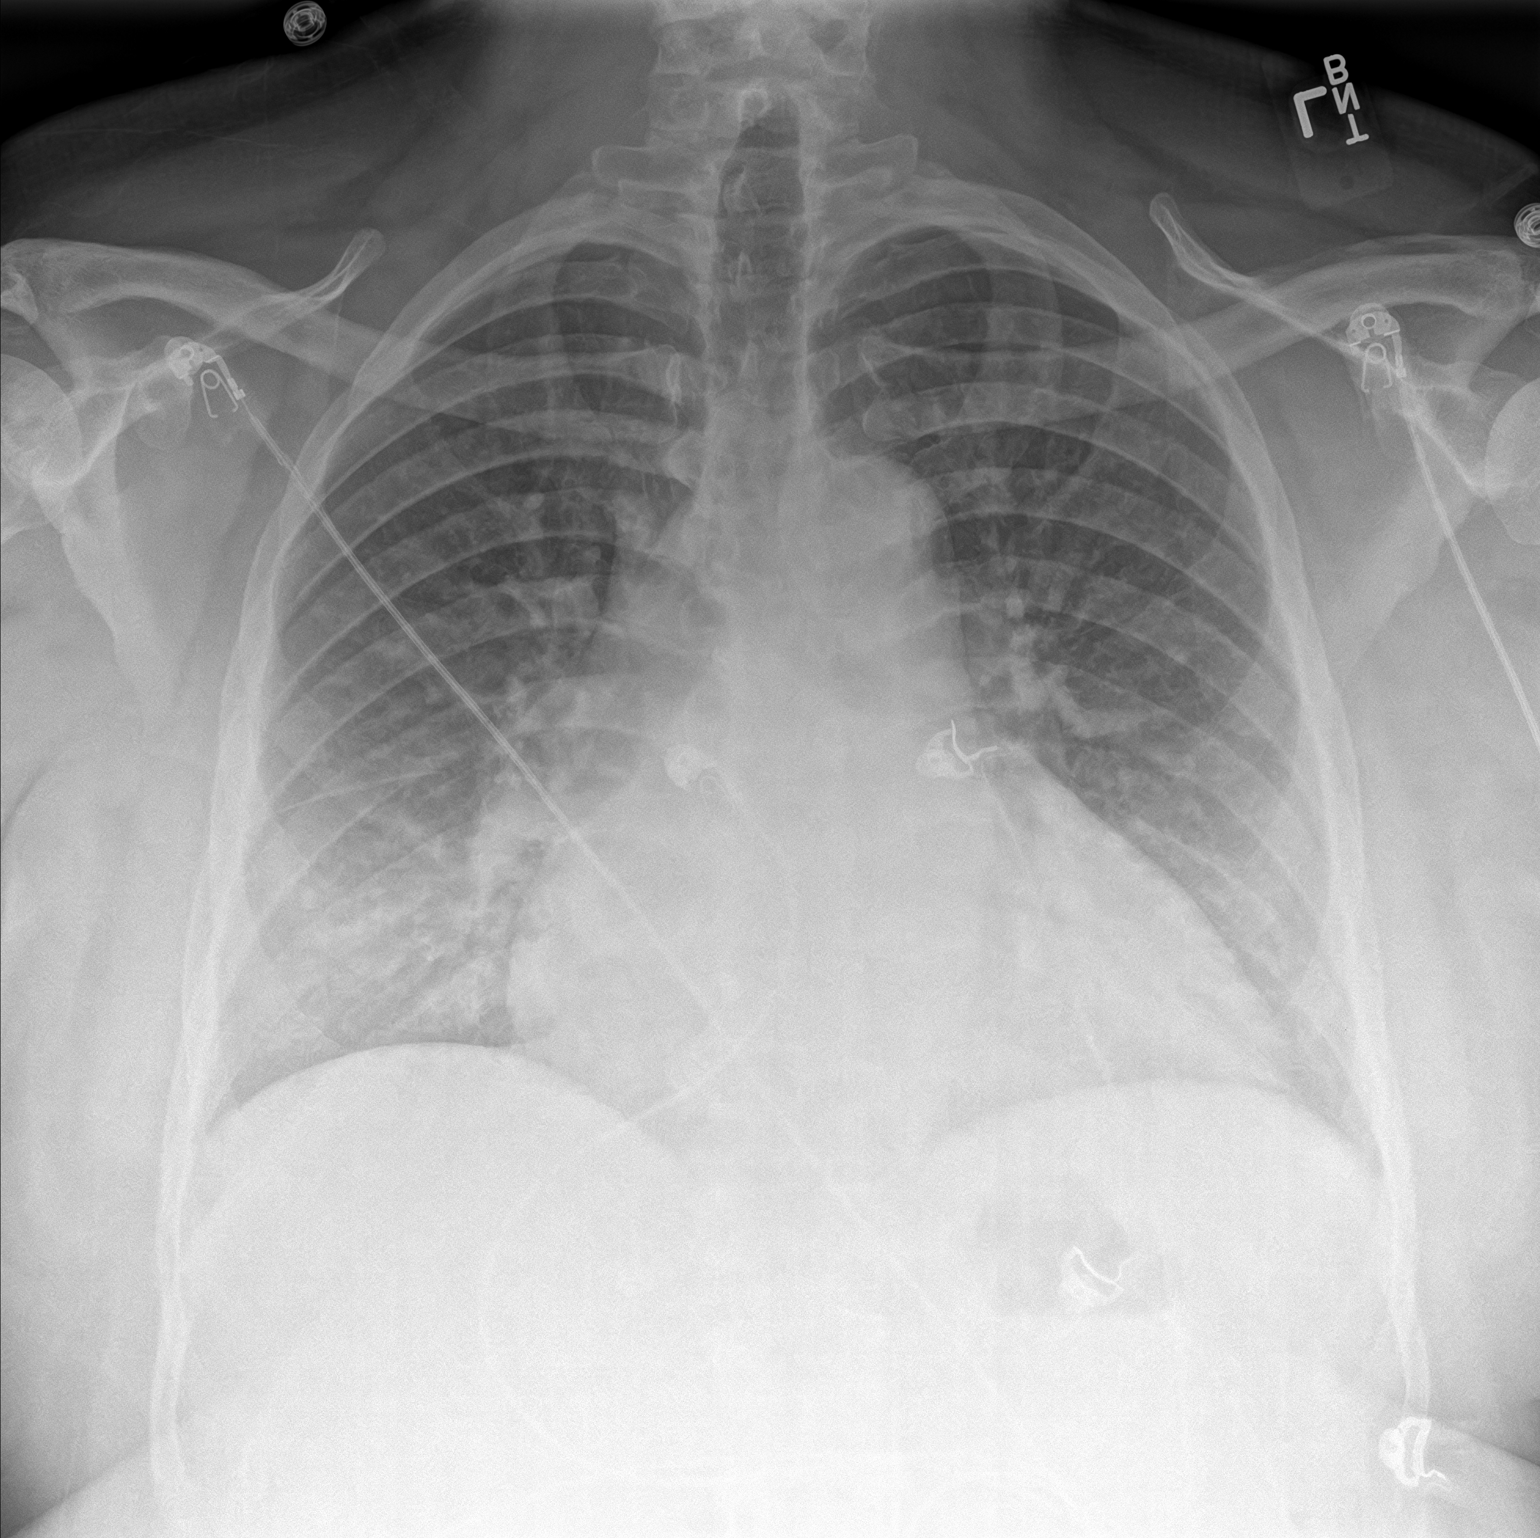

[chest lat]
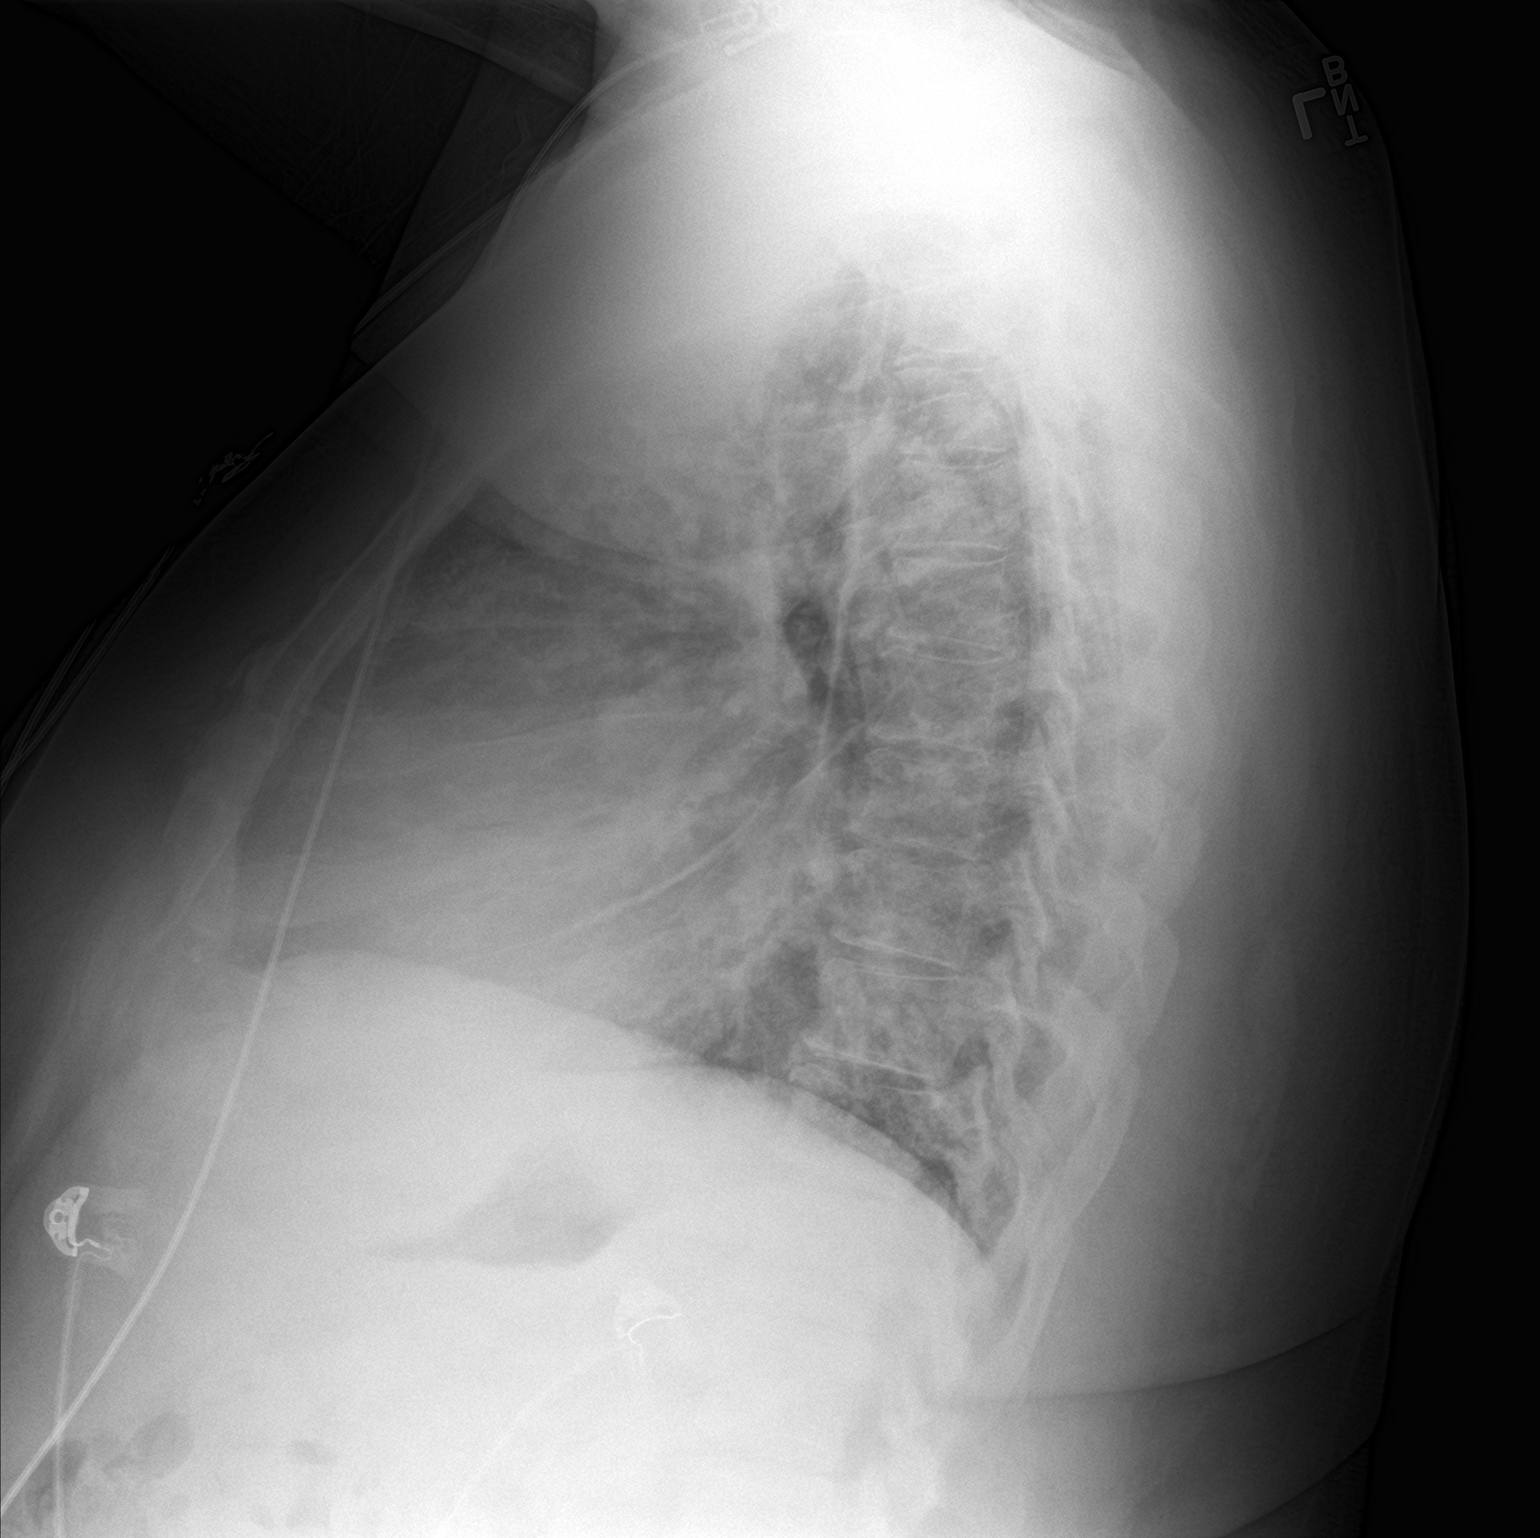

[2 of 2 positions shown; findings below may reference images not displayed]

FINDINGS: Chronic cardiomegaly. Increased pulmonary vascularity since the
prior study. No discrete pulmonary edema or effusions. Bones appear
normal.
IMPRESSION: Increasing pulmonary vascular congestion.  Chronic cardiomegaly.

## 2018-09-26 ENCOUNTER — Telehealth (INDEPENDENT_AMBULATORY_CARE_PROVIDER_SITE_OTHER): Payer: Medicare Other | Admitting: Physician Assistant

## 2018-09-26 ENCOUNTER — Other Ambulatory Visit: Payer: Self-pay

## 2018-09-26 VITALS — BP 145/95 | Ht 72.0 in | Wt 376.0 lb

## 2018-09-26 DIAGNOSIS — N182 Chronic kidney disease, stage 2 (mild): Secondary | ICD-10-CM | POA: Diagnosis not present

## 2018-09-26 DIAGNOSIS — Z87891 Personal history of nicotine dependence: Secondary | ICD-10-CM | POA: Diagnosis not present

## 2018-09-26 DIAGNOSIS — I5042 Chronic combined systolic (congestive) and diastolic (congestive) heart failure: Secondary | ICD-10-CM | POA: Diagnosis not present

## 2018-09-26 DIAGNOSIS — I130 Hypertensive heart and chronic kidney disease with heart failure and stage 1 through stage 4 chronic kidney disease, or unspecified chronic kidney disease: Secondary | ICD-10-CM | POA: Diagnosis not present

## 2018-09-26 DIAGNOSIS — Z7901 Long term (current) use of anticoagulants: Secondary | ICD-10-CM

## 2018-09-26 DIAGNOSIS — I5033 Acute on chronic diastolic (congestive) heart failure: Secondary | ICD-10-CM

## 2018-09-26 DIAGNOSIS — I272 Pulmonary hypertension, unspecified: Secondary | ICD-10-CM | POA: Diagnosis not present

## 2018-09-26 DIAGNOSIS — I4821 Permanent atrial fibrillation: Secondary | ICD-10-CM

## 2018-09-26 DIAGNOSIS — G4733 Obstructive sleep apnea (adult) (pediatric): Secondary | ICD-10-CM | POA: Diagnosis not present

## 2018-09-26 DIAGNOSIS — I10 Essential (primary) hypertension: Secondary | ICD-10-CM | POA: Diagnosis not present

## 2018-09-26 DIAGNOSIS — I482 Chronic atrial fibrillation, unspecified: Secondary | ICD-10-CM | POA: Diagnosis not present

## 2018-09-26 DIAGNOSIS — Z79899 Other long term (current) drug therapy: Secondary | ICD-10-CM

## 2018-09-26 NOTE — Patient Outreach (Signed)
[facility]) Care Management  09/26/2018  [name] [birth year] [id number]    Successful outreach with [name]. Reports feeling well. Reports not checking his weight at home but recalls a reading of 376 lbs during visit at [facility] earlier today. No complaints of shortness of breath. States he has been ambulating well. Reports taking medications earlier today. Highly encouraged to take evening doses as prescribed.  PLAN Will continue to encourage compliance with medications. Will follow up on 09/28/18.   Katha Cabal [facility] Community Care Manager [phone]

## 2018-09-26 NOTE — Patient Instructions (Signed)
Medication Instructions:  Your physician has recommended you make the following change in your medication:   Increase Bumex to mg 3 times a day for 3 days Then resume back to Bumex 2 mg 2 Times daily.   Increase Potassium to 20 meq 3 Times a day for 3 days. Then resume back to Potassium 20 meq 2 times daily    Labwork: Your physician recommends that you return for lab work in: 1 Week just before your next visit    Testing/Procedures: NONE  Follow-Up: Your physician recommends that you schedule a follow-up appointment in: 1 Week   Any Other Special Instructions Will Be Listed Below (If Applicable).  Call office on Wednesday with current weight.   If you need a refill on your cardiac medications before your next appointment, please call your pharmacy.  Thank you for choosing [facility]!

## 2018-09-26 NOTE — Progress Notes (Signed)
Virtual Visit via Telephone Note   This visit type was conducted due to national recommendations for restrictions regarding the COVID-19 Pandemic (e.g. social distancing) in an effort to limit this patient's exposure and mitigate transmission in our community.  Due to his co-morbid illnesses, this patient is at least at moderate risk for complications without adequate follow up.  This format is felt to be most appropriate for this patient at this time.  The patient did not have access to video technology/had technical difficulties with video requiring transitioning to audio format only (telephone).  All issues noted in this document were discussed and addressed.  No physical exam could be performed with this format.  Please refer to the patient's chart for his  consent to telehealth for [facility].  Evaluation Performed:  Follow-up visit  This visit type was conducted due to national recommendations for restrictions regarding the COVID-19 Pandemic (e.g. social distancing).  This format is felt to be most appropriate for this patient at this time.  All issues noted in this document were discussed and addressed.  Due to his comorbid illnesses, this patient is felt to be at least at moderate risk without adequate follow up.  No physical exam was performed (except for noted visual exam findings with Video Visits).  Please refer to the patient's chart (MyChart message for video visits and phone note for telephone visits) for the patient's consent to telehealth for [facility].  Date:  09/26/2018   ID:  Beulah Gandy, DOB 1965/07/25, MRN [id number]  Patient Location: Home  Provider Location: Home  PCP:  Jani Gravel, MD  Cardiologist:  Kate Sable, MD  Electrophysiologist:  None   Chief Complaint:  Post hospital follow up  History of Present Illness:    [name] is a 53 y.o. male who presents via audio/video conferencing for a telehealth visit today.    Patient has a history of  chronic diastolic CHF, permanent atrial fibrillation on Xarelto, hypertension, HLD, long history of medical noncompliance.  The echo 03/2018 LVEF 55% with moderate late to severely dilated left atrium.  Patient had 2 admissions in March with acute on chronic diastolic CHF due to noncompliance.  Discharge weight on 09/02/2018 was 369 pounds.  He received a call from our office to check on him 09/14/2018 and admitted to not taking his Bumex twice a day.  He was encouraged to be compliant but was readmitted.  Discharge weight on 09/20/2018 was 359 pounds.  Creatinine was 2.1.  [facility] is following him as an outpatient.  Patient says his weight is up to 376 lbs today. His scales showed he was 361 lbs when he came home.Legs are a little swollen and short of breath when he goes up stairs or walks any distance. He's been getting take out at [facility] and [employer].He says the medicine never works like it does in the hospital.  The patient does not have symptoms concerning for COVID-19 infection (fever, chills, cough, or new shortness of breath).    Past Medical History:  Diagnosis Date  . Allergic rhinitis   . Asthma   . Atrial fibrillation ([place])   . CHF (congestive heart failure) ([place])   . Essential hypertension   . Gastroesophageal reflux disease   . Heart attack ([place])   . History of cardiac catheterization    Normal coronaries December 2016  . Noncompliance    Major problem leading to declining health and recurrent hospitalization  . Nonischemic cardiomyopathy ([place])  a. LVEF 20-25% in 2017 with cath in 2016 showing normal cors. b. EF 55% by echo in 03/2018.  . On home O2    3L N/C  . OSA (obstructive sleep apnea)   . Osteoarthritis   . Peptic ulcer disease    Past Surgical History:  Procedure Laterality Date  . CARDIAC CATHETERIZATION N/A 06/14/2015   Procedure: Right/Left Heart Cath and Coronary Angiography;  Surgeon: Lorretta Harp, MD;  Location: [place] CV LAB;  Service:  Cardiovascular;  Laterality: N/A;     No outpatient medications have been marked as taking for the 09/26/18 encounter (Telemedicine) with Imogene Burn, PA-C.     Allergies:   Banana; Bee venom; Food; Aspirin; Metolazone; Oatmeal; Orange juice [orange oil]; Torsemide; Diltiazem; Hydralazine; and Lipitor [atorvastatin]   Social History   Tobacco Use  . Smoking status: Former Smoker    Packs/day: 0.50    Years: 20.00    Pack years: 10.00    Types: Cigarettes    Start date: 04/26/1988    Last attempt to quit: 06/23/2007    Years since quitting: 11.2  . Smokeless tobacco: Never Used  . Tobacco comment: 1 ppd former smoker  Substance Use Topics  . Alcohol use: No    Alcohol/week: 0.0 standard drinks    Comment: No etoh since 2009  . Drug use: No     Family Hx: The patient's family history includes Aneurysm in his mother; Heart attack in his father; Hypertension in his sister; Stroke in his father. There is no history of Colon cancer, Inflammatory bowel disease, or Liver disease.  ROS:   Please see the history of present illness.    Review of Systems  Constitution: Positive for weight gain.  HENT: Negative.   Cardiovascular: Positive for dyspnea on exertion and leg swelling.  Respiratory: Negative.   Endocrine: Negative.   Hematologic/Lymphatic: Negative.   Musculoskeletal: Negative.   Gastrointestinal: Negative.   Genitourinary: Negative.   Neurological: Negative.     All other systems reviewed and are negative.   Prior CV studies:   The following studies were reviewed today: Echo 10/2019Study Conclusions   - Left ventricle: The cavity size was normal. Wall thickness was   increased in a pattern of moderate LVH. The estimated ejection   fraction was 55%. Wall motion was normal; there were no regional   wall motion abnormalities. The study was not technically   sufficient to allow evaluation of LV diastolic dysfunction due to   atrial fibrillation. - Aortic valve:  Mildly calcified annulus. Trileaflet. - Mitral valve: Mildly thickened leaflets. There was trivial   regurgitation. - Left atrium: The atrium was moderately to severely dilated. - Right atrium: Central venous pressure (est): 3 mm Hg. - Tricuspid valve: There was trivial regurgitation. - Pulmonary arteries: Systolic pressure could not be accurately   estimated. - Pericardium, extracardiac: A trivial pericardial effusion was   identified.      Labs/Other Tests and Data Reviewed:    EKG:  An ECG dated 09/18/28 was personally reviewed today and demonstrated:  Atrial fibrillation with PVC's  Recent Labs: 09/01/2018: ALT 14 09/18/2018: B Natriuretic Peptide 256.0; Hemoglobin 12.8; Platelets 200 09/20/2018: BUN 20; Creatinine, Ser 1.21; Magnesium 2.1; Potassium 3.3; Sodium 139   Recent Lipid Panel Lab Results  Component Value Date/Time   CHOL 139 06/14/2015 03:28 AM   TRIG 58 06/14/2015 03:28 AM   HDL 28 (L) 06/14/2015 03:28 AM   CHOLHDL 5.0 06/14/2015 03:28 AM   LDLCALC  99 06/14/2015 03:28 AM    Wt Readings from Last 3 Encounters:  09/26/18 (!) 376 lb (170.6 kg)  09/20/18 (!) 359 lb (162.8 kg)  09/02/18 (!) 369 lb 4.3 oz (167.5 kg)     Objective:    Vital Signs:  BP (!) 145/95   Ht 6' (1.829 m)   Wt (!) 376 lb (170.6 kg)   BMI 50.99 kg/m      ASSESSMENT & PLAN:    1. Acute on chronic diastolic CHF.  Discharge weight 09/20/2018 was 359 pounds.  Patient has been noncompliant with his diuretics due to frequent urination causing recurrent admissions. K 3.3 at discharge.  Sent home on potassium patient's weight is up 17 pounds since discharge from the hospital 6 days ago.  He says the medications just do not work.  He claims to be taking them and says he is following a low-salt diet although he is getting takeout.  Increase Bumex to 2 mg 3 times a day for 3 days then back to 2 twice daily.  Increase K-Dur to 20 mEq 3 times a day for 3 days then twice daily.  Continue to weigh  daily.  We will contact him on Wednesday to see how he is doing.  If his weight does not come down he may need to come in for DOD visit.  We will also need to get a  be met next week. 2. Permanent atrial fibrillation on Xarelto- no problem with bleeding 3. Essential hypertension BP running high. Hopefully will come down with diuresis 4. CKD stage II creatinine on 09/20/2018.  Will need to repeat next week   COVID-19 Education: The signs and symptoms of COVID-19 were discussed with the patient and how to seek care for testing (follow up with PCP or arrange E-visit).   The importance of social distancing was discussed today.  Time:   Today, I have spent 19 minutes with the patient with telehealth technology discussing the above problems.     Medication Adjustments/Labs and Tests Ordered: Current medicines are reviewed at length with the patient today.  Concerns regarding medicines are outlined above.  Tests Ordered: Orders Placed This Encounter  Procedures  . Basic Metabolic Panel (BMET)   Medication Changes: No orders of the defined types were placed in this encounter.   Disposition:  Follow up in 1 week(s) either televisit with myself or dod visit if no improvement with increase in diuretics.  Signed, Ermalinda Barrios, PA-C  09/26/2018 3:20 PM     [facility]

## 2018-09-28 ENCOUNTER — Other Ambulatory Visit: Payer: Self-pay

## 2018-09-28 ENCOUNTER — Telehealth: Payer: Self-pay | Admitting: *Deleted

## 2018-09-28 NOTE — Telephone Encounter (Signed)
Spoke with pt who states that swelling has decreased slowly and that today's current wt is 382lbs.

## 2018-09-28 NOTE — Progress Notes (Deleted)
{Choose 1 Note Type (Telehealth Visit or Telephone Visit):[phone]}   Evaluation Performed:  Follow-up visit  Date:  09/28/2018   ID:  [name], DOB 17-May-1966, MRN [id number]  {Patient Location:[phone]::"Home"}  {Provider [phone]}  PCP:  Pearson Grippe, MD  Cardiologist:  Prentice Docker, MD  Electrophysiologist:  None   Chief Complaint:  ***  History of Present Illness:    [name] is a 53 y.o. male who presents via audio/video conferencing for a telehealth visit today.    Patient has a history of chronic diastolic CHF, permanent atrial fibrillation on Xarelto, hypertension, HLD, long history of medical noncompliance.  The echo 03/2018 LVEF 55% with moderate late to severely dilated left atrium.   Patient had 2 admissions in March with acute on chronic diastolic CHF due to noncompliance.  Discharge weight on 09/02/2018 was 369 pounds.  He received a call from our office to check on him 09/14/2018 and admitted to not taking his Bumex twice a day.  He was encouraged to be compliant but was readmitted.  Discharge weight on 09/20/2018 was 359 pounds.  Creatinine was 2.1.  [facility] is following him as an outpatient.   I had a televisit with the patient 09/26/18 and weight was up15 lbs and short of breath and edema. Was eating fast food.  The patient {does/does not:200015} have symptoms concerning for COVID-19 infection (fever, chills, cough, or new shortness of breath).    Past Medical History:  Diagnosis Date  . Allergic rhinitis   . Asthma   . Atrial fibrillation ([facility])   . CHF (congestive heart failure) ([facility])   . Essential hypertension   . Gastroesophageal reflux disease   . Heart attack ([facility])   . History of cardiac catheterization    Normal coronaries December 2016  . Noncompliance    Major problem leading to declining health and recurrent hospitalization  . Nonischemic cardiomyopathy ([facility])    a. LVEF 20-25% in 2017 with cath in 2016 showing normal cors. b. EF  55% by echo in 03/2018.  . On home O2    3L N/C  . OSA (obstructive sleep apnea)   . Osteoarthritis   . Peptic ulcer disease    Past Surgical History:  Procedure Laterality Date  . CARDIAC CATHETERIZATION N/A 06/14/2015   Procedure: Right/Left Heart Cath and Coronary Angiography;  Surgeon: Runell Gess, MD;  Location: [facility] INVASIVE CV LAB;  Service: Cardiovascular;  Laterality: N/A;     No outpatient medications have been marked as taking for the 10/03/18 encounter (Appointment) with Dyann Kief, PA-C.     Allergies:   Banana; Bee venom; Food; Aspirin; Metolazone; Oatmeal; Orange juice [orange oil]; Torsemide; Diltiazem; Hydralazine; and Lipitor [atorvastatin]   Social History   Tobacco Use  . Smoking status: Former Smoker    Packs/day: 0.50    Years: 20.00    Pack years: 10.00    Types: Cigarettes    Start date: 04/26/1988    Last attempt to quit: 06/23/2007    Years since quitting: 11.2  . Smokeless tobacco: Never Used  . Tobacco comment: 1 ppd former smoker  Substance Use Topics  . Alcohol use: No    Alcohol/week: 0.0 standard drinks    Comment: No etoh since 2009  . Drug use: No     Family Hx: The patient's family history includes Aneurysm in his mother; Heart attack in his father; Hypertension in his sister; Stroke in his father. There is no history of Colon cancer, Inflammatory bowel  disease, or Liver disease.  ROS:   Please see the history of present illness.    *** All other systems reviewed and are negative.   Prior CV studies:   The following studies were reviewed today:   Echo 10/2019Study Conclusions   - Left ventricle: The cavity size was normal. Wall thickness was   increased in a pattern of moderate LVH. The estimated ejection   fraction was 55%. Wall motion was normal; there were no regional   wall motion abnormalities. The study was not technically   sufficient to allow evaluation of LV diastolic dysfunction due to   atrial fibrillation. -  Aortic valve: Mildly calcified annulus. Trileaflet. - Mitral valve: Mildly thickened leaflets. There was trivial   regurgitation. - Left atrium: The atrium was moderately to severely dilated. - Right atrium: Central venous pressure (est): 3 mm Hg. - Tricuspid valve: There was trivial regurgitation. - Pulmonary arteries: Systolic pressure could not be accurately   estimated. - Pericardium, extracardiac: A trivial pericardial effusion was   identified.       Labs/Other Tests and Data Reviewed:    EKG:  {ZOX:[id number]}  Recent Labs: 09/01/2018: ALT 14 09/18/2018: B Natriuretic Peptide 256.0; Hemoglobin 12.8; Platelets 200 09/20/2018: BUN 20; Creatinine, Ser 1.21; Magnesium 2.1; Potassium 3.3; Sodium 139   Recent Lipid Panel Lab Results  Component Value Date/Time   CHOL 139 06/14/2015 03:28 AM   TRIG 58 06/14/2015 03:28 AM   HDL 28 (L) 06/14/2015 03:28 AM   CHOLHDL 5.0 06/14/2015 03:28 AM   LDLCALC 99 06/14/2015 03:28 AM    Wt Readings from Last 3 Encounters:  09/26/18 (!) 376 lb (170.6 kg)  09/20/18 (!) 359 lb (162.8 kg)  09/02/18 (!) 369 lb 4.3 oz (167.5 kg)     Objective:    Vital Signs:  There were no vitals taken for this visit.   Well nourished, well developed male in no*** acute distress. ***  ASSESSMENT & PLAN:    1. Chronic diastolic CHF discharge weight 119 lbs on 09/20/18 and weight up 15-17 lbs 6 days after discharge. I increased bumex 2 mg 3 times a dayfor 3 days then back to twice daily.  2. Permanent atrial fibrillation on Xarelto 3. Essential HTN 4. CKD stage 2 Crt 1.21 ln 09/20/18  COVID-19 Education: The signs and symptoms of COVID-19 were discussed with the patient and how to seek care for testing (follow up with PCP or arrange E-visit).  ***The importance of social distancing was discussed today.  Time:   Today, I have spent *** minutes with the patient with telehealth technology discussing the above problems.     Medication Adjustments/Labs and  Tests Ordered: Current medicines are reviewed at length with the patient today.  Concerns regarding medicines are outlined above.  Tests Ordered: No orders of the defined types were placed in this encounter.  Medication Changes: No orders of the defined types were placed in this encounter.   Disposition:  Follow up {follow up:15908}  Signed, Jacolyn Reedy, PA-C  09/28/2018 12:31 PM    [facility]

## 2018-09-28 NOTE — Patient Outreach (Signed)
[facility]) Care Management  09/28/2018  Samuel SCHWINGHAMMER March 12, 1966 [id number]    Successful outreach with Mr. Shadbolt. Reports being tired due to being out earlier today. He did not weigh or take medications this morning. Agreed to take medications immediately after the call. No complaints of shortness of breath or increased swelling to lower extremities. Reports ambulating well. Mr. Kruzan was strongly encouraged to take medications as prescribed. Reports attempting to take Bumex every twelve hours but skips doses due to waking up late. Discussed recommendations related to COVID-19. Encouraged to avoid unnecessary outings due to his high risk for complications.  He denied immediate care management needs and agreed to call if needed prior to next outreach.  PLAN Will follow up next week.   Katha Cabal [facility] Community Care Manager [phone]

## 2018-09-28 NOTE — Telephone Encounter (Signed)
Called pt to reassess swelling and wt. No answer will try again later.

## 2018-09-29 ENCOUNTER — Other Ambulatory Visit: Payer: Self-pay | Admitting: Pharmacist

## 2018-09-29 ENCOUNTER — Other Ambulatory Visit: Payer: Self-pay

## 2018-09-29 ENCOUNTER — Telehealth: Payer: Self-pay | Admitting: Cardiovascular Disease

## 2018-09-29 NOTE — Patient Outreach (Signed)
[name]    [name] 26-Aug-1965 [id number]  Successful outreach with [name]. Reports episodes of shortness of breath while ambulating earlier today but reports feeling well during the call. States his lower extremities are "a little swollen." Reports taking first dose of medications around noon. Reports a weight of 373 lbs.  PLAN Will follow up on 09/26/18.   [name] [phone]

## 2018-09-29 NOTE — Telephone Encounter (Signed)

## 2018-09-29 NOTE — Patient Outreach (Signed)
[facility]) Care Management  09/29/2018  [name] May 01, 1966 [id number]  Follow up outreach with [name]. He denied complaints during the call but did not increase frequency of Bumex and K-Dur as instructed on 09/26/18. Informed Cardiology. Dates adjusted. Instructed to take Bumex and K-Dur three times a day starting today through Saturday. [name] verbalized understanding of plan. Agreed to follow-up outreach this weekend. PLAN Will follow up this weekend.   Katha Cabal [facility] Community Care Manager [phone]

## 2018-09-29 NOTE — Patient Outreach (Signed)
[facility]) Care Management  09/29/2018  [name] Mar 05, 1966 [id number]   Patient was called to complete a medication review and to follow up. A male answered the phone and said I would need to call the patient back because he was outside.  Plan: Call patient back at previously scheduled call back time on Monday 10/03/2018.   [name], PharmD, BCACP [facility] Clinical Pharmacist [phone]

## 2018-09-30 ENCOUNTER — Ambulatory Visit: Payer: Self-pay

## 2018-10-01 ENCOUNTER — Other Ambulatory Visit: Payer: Self-pay

## 2018-10-01 NOTE — Patient Outreach (Signed)
[facility]) Care Management  10/01/2018  [name] 05/24/1966 [id number]    Medication Follow-Up Successful outreach with Mr. Cupit. Reports taking three doses of Bumex and K-Dur on Thursday and Friday. States he may still go to the ED to "get some of the fluid off." Reports he did not weigh today but states weight on yesterday was around 390lbs. Denies complaints of shortness of breath or chest discomfort at time of call but reports increased edema to his lower extremities. Claims to have taken medications earlier today, and reports that he will take 3rd dose of Bumex and K-Dur along with evening meds at 8pm.  Reviewed symptoms requiring immediate medical attention. Reviewed recommendations and respiratory restrictions related to COVID-19. He is aware of pending reassessment/outreach with Cardiology on Monday.    PLAN Will follow up next week.   Katha Cabal [facility] Community Care Manager [phone]

## 2018-10-02 ENCOUNTER — Emergency Department (HOSPITAL_COMMUNITY): Payer: Medicare Other

## 2018-10-02 ENCOUNTER — Inpatient Hospital Stay (HOSPITAL_COMMUNITY)
Admission: EM | Admit: 2018-10-02 | Discharge: 2018-10-07 | DRG: 291 | Disposition: A | Discharge status: Home / Self Care | Payer: Medicare Other | Attending: Internal Medicine | Admitting: Internal Medicine

## 2018-10-02 ENCOUNTER — Other Ambulatory Visit: Payer: Self-pay

## 2018-10-02 ENCOUNTER — Encounter (HOSPITAL_COMMUNITY): Payer: Self-pay | Admitting: Emergency Medicine

## 2018-10-02 DIAGNOSIS — I509 Heart failure, unspecified: Secondary | ICD-10-CM | POA: Diagnosis not present

## 2018-10-02 DIAGNOSIS — I5043 Acute on chronic combined systolic (congestive) and diastolic (congestive) heart failure: Secondary | ICD-10-CM

## 2018-10-02 DIAGNOSIS — I10 Essential (primary) hypertension: Secondary | ICD-10-CM | POA: Diagnosis present

## 2018-10-02 DIAGNOSIS — J449 Chronic obstructive pulmonary disease, unspecified: Secondary | ICD-10-CM | POA: Diagnosis present

## 2018-10-02 DIAGNOSIS — I110 Hypertensive heart disease with heart failure: Secondary | ICD-10-CM | POA: Diagnosis not present

## 2018-10-02 DIAGNOSIS — Z8249 Family history of ischemic heart disease and other diseases of the circulatory system: Secondary | ICD-10-CM

## 2018-10-02 DIAGNOSIS — R9431 Abnormal electrocardiogram [ECG] [EKG]: Secondary | ICD-10-CM | POA: Diagnosis not present

## 2018-10-02 DIAGNOSIS — J309 Allergic rhinitis, unspecified: Secondary | ICD-10-CM | POA: Diagnosis present

## 2018-10-02 DIAGNOSIS — I482 Chronic atrial fibrillation, unspecified: Secondary | ICD-10-CM | POA: Diagnosis not present

## 2018-10-02 DIAGNOSIS — I130 Hypertensive heart and chronic kidney disease with heart failure and stage 1 through stage 4 chronic kidney disease, or unspecified chronic kidney disease: Secondary | ICD-10-CM | POA: Diagnosis not present

## 2018-10-02 DIAGNOSIS — Z9981 Dependence on supplemental oxygen: Secondary | ICD-10-CM | POA: Diagnosis not present

## 2018-10-02 DIAGNOSIS — Z823 Family history of stroke: Secondary | ICD-10-CM

## 2018-10-02 DIAGNOSIS — Z9114 Patient's other noncompliance with medication regimen: Secondary | ICD-10-CM

## 2018-10-02 DIAGNOSIS — Z91018 Allergy to other foods: Secondary | ICD-10-CM | POA: Diagnosis not present

## 2018-10-02 DIAGNOSIS — Z7901 Long term (current) use of anticoagulants: Secondary | ICD-10-CM

## 2018-10-02 DIAGNOSIS — I252 Old myocardial infarction: Secondary | ICD-10-CM | POA: Diagnosis not present

## 2018-10-02 DIAGNOSIS — R0602 Shortness of breath: Secondary | ICD-10-CM | POA: Diagnosis not present

## 2018-10-02 DIAGNOSIS — K219 Gastro-esophageal reflux disease without esophagitis: Secondary | ICD-10-CM | POA: Diagnosis not present

## 2018-10-02 DIAGNOSIS — J9610 Chronic respiratory failure, unspecified whether with hypoxia or hypercapnia: Secondary | ICD-10-CM | POA: Diagnosis not present

## 2018-10-02 DIAGNOSIS — R0689 Other abnormalities of breathing: Secondary | ICD-10-CM | POA: Diagnosis not present

## 2018-10-02 DIAGNOSIS — N400 Enlarged prostate without lower urinary tract symptoms: Secondary | ICD-10-CM | POA: Diagnosis present

## 2018-10-02 DIAGNOSIS — E785 Hyperlipidemia, unspecified: Secondary | ICD-10-CM | POA: Diagnosis present

## 2018-10-02 DIAGNOSIS — Z79899 Other long term (current) drug therapy: Secondary | ICD-10-CM

## 2018-10-02 DIAGNOSIS — Z9119 Patient's noncompliance with other medical treatment and regimen: Secondary | ICD-10-CM

## 2018-10-02 DIAGNOSIS — F17200 Nicotine dependence, unspecified, uncomplicated: Secondary | ICD-10-CM | POA: Diagnosis present

## 2018-10-02 DIAGNOSIS — Z91030 Bee allergy status: Secondary | ICD-10-CM

## 2018-10-02 DIAGNOSIS — N182 Chronic kidney disease, stage 2 (mild): Secondary | ICD-10-CM | POA: Diagnosis present

## 2018-10-02 DIAGNOSIS — E662 Morbid (severe) obesity with alveolar hypoventilation: Secondary | ICD-10-CM | POA: Diagnosis present

## 2018-10-02 DIAGNOSIS — J9611 Chronic respiratory failure with hypoxia: Secondary | ICD-10-CM | POA: Diagnosis not present

## 2018-10-02 DIAGNOSIS — I428 Other cardiomyopathies: Secondary | ICD-10-CM | POA: Diagnosis present

## 2018-10-02 DIAGNOSIS — Z7983 Long term (current) use of bisphosphonates: Secondary | ICD-10-CM | POA: Diagnosis not present

## 2018-10-02 DIAGNOSIS — R062 Wheezing: Secondary | ICD-10-CM | POA: Diagnosis not present

## 2018-10-02 DIAGNOSIS — Z6841 Body Mass Index (BMI) 40.0 and over, adult: Secondary | ICD-10-CM | POA: Diagnosis not present

## 2018-10-02 DIAGNOSIS — R609 Edema, unspecified: Secondary | ICD-10-CM | POA: Diagnosis not present

## 2018-10-02 DIAGNOSIS — Z9111 Patient's noncompliance with dietary regimen: Secondary | ICD-10-CM

## 2018-10-02 DIAGNOSIS — Z888 Allergy status to other drugs, medicaments and biological substances status: Secondary | ICD-10-CM

## 2018-10-02 DIAGNOSIS — Z886 Allergy status to analgesic agent status: Secondary | ICD-10-CM

## 2018-10-02 LAB — BASIC METABOLIC PANEL
Anion gap: 8 (ref 5–15)
BUN: 16 mg/dL (ref 6–20)
CO2: 25 mmol/L (ref 22–32)
Calcium: 8.5 mg/dL — ABNORMAL LOW (ref 8.9–10.3)
Chloride: 108 mmol/L (ref 98–111)
Creatinine, Ser: 1.16 mg/dL (ref 0.61–1.24)
GFR calc Af Amer: >60 mL/min (ref >60)
GFR calc non Af Amer: >60 mL/min (ref >60)
Glucose, Bld: 110 mg/dL — ABNORMAL HIGH (ref 70–99)
Potassium: 3.5 mmol/L (ref 3.5–5.1)
Sodium: 141 mmol/L (ref 135–145)

## 2018-10-02 LAB — CBC
HCT: 39 % (ref 39.0–52.0)
Hemoglobin: 12.3 g/dL — ABNORMAL LOW (ref 13.0–17.0)
MCH: 28 pg (ref 26.0–34.0)
MCHC: 31.5 g/dL (ref 30.0–36.0)
MCV: 88.8 fL (ref 80.0–100.0)
Platelets: 147 10*3/uL — ABNORMAL LOW (ref 150–400)
RBC: 4.39 MIL/uL (ref 4.22–5.81)
RDW: 13.9 % (ref 11.5–15.5)
WBC: 4.2 10*3/uL (ref 4.0–10.5)
nRBC: 0 % (ref 0.0–0.2)

## 2018-10-02 LAB — MAGNESIUM: Magnesium: 2 mg/dL (ref 1.7–2.4)

## 2018-10-02 LAB — BRAIN NATRIURETIC PEPTIDE: B Natriuretic Peptide: 328 pg/mL — ABNORMAL HIGH (ref 0.0–100.0)

## 2018-10-02 MED ORDER — TAMSULOSIN HCL 0.4 MG PO CAPS
0.4000 mg | ORAL_CAPSULE | Freq: Every day | ORAL | Status: DC
  Administered 2018-10-03 – 2018-10-07 (×5): 0.4 mg via ORAL
  Filled 2018-10-02 (×5): qty 1

## 2018-10-02 MED ORDER — ACETAMINOPHEN 650 MG RE SUPP: 650.0000 mg | Freq: Four times a day (QID) | RECTAL | Status: DC | PRN

## 2018-10-02 MED ORDER — BUMETANIDE 1 MG PO TABS
2.0000 mg | ORAL_TABLET | Freq: Once | ORAL | Status: AC
  Administered 2018-10-02: 2 mg via ORAL
  Filled 2018-10-02: qty 2

## 2018-10-02 MED ORDER — CARVEDILOL 12.5 MG PO TABS
25.0000 mg | ORAL_TABLET | Freq: Two times a day (BID) | ORAL | Status: DC
  Administered 2018-10-02 – 2018-10-07 (×11): 25 mg via ORAL
  Filled 2018-10-02 (×11): qty 2

## 2018-10-02 MED ORDER — FERROUS GLUCONATE 324 (38 FE) MG PO TABS
324.0000 mg | ORAL_TABLET | Freq: Two times a day (BID) | ORAL | Status: DC
  Administered 2018-10-02 – 2018-10-07 (×11): 324 mg via ORAL
  Filled 2018-10-02 (×16): qty 1

## 2018-10-02 MED ORDER — ONDANSETRON HCL 4 MG PO TABS: 4.0000 mg | ORAL_TABLET | Freq: Four times a day (QID) | ORAL | Status: DC | PRN

## 2018-10-02 MED ORDER — SODIUM CHLORIDE 0.9% FLUSH
3.0000 mL | Freq: Two times a day (BID) | INTRAVENOUS | Status: DC
  Administered 2018-10-03 – 2018-10-07 (×9): 3 mL via INTRAVENOUS

## 2018-10-02 MED ORDER — RIVAROXABAN 20 MG PO TABS
20.0000 mg | ORAL_TABLET | Freq: Every day | ORAL | Status: DC
  Administered 2018-10-02 – 2018-10-07 (×6): 20 mg via ORAL
  Filled 2018-10-02 (×7): qty 1

## 2018-10-02 MED ORDER — POTASSIUM CHLORIDE CRYS ER 20 MEQ PO TBCR
20.0000 meq | EXTENDED_RELEASE_TABLET | Freq: Two times a day (BID) | ORAL | Status: DC
  Administered 2018-10-02 (×2): 20 meq via ORAL
  Filled 2018-10-02 (×3): qty 1

## 2018-10-02 MED ORDER — ACETAMINOPHEN 325 MG PO TABS
650.0000 mg | ORAL_TABLET | Freq: Four times a day (QID) | ORAL | Status: DC | PRN
  Administered 2018-10-02: 650 mg via ORAL
  Filled 2018-10-02: qty 2

## 2018-10-02 MED ORDER — SODIUM CHLORIDE 0.9 % IV SOLN: 250.0000 mL | INTRAVENOUS | Status: DC | PRN

## 2018-10-02 MED ORDER — PANTOPRAZOLE SODIUM 40 MG PO TBEC
40.0000 mg | DELAYED_RELEASE_TABLET | Freq: Every day | ORAL | Status: DC
  Administered 2018-10-03 – 2018-10-07 (×5): 40 mg via ORAL
  Filled 2018-10-02 (×5): qty 1

## 2018-10-02 MED ORDER — CLONIDINE HCL 0.1 MG PO TABS: 0.1000 mg | ORAL_TABLET | Freq: Three times a day (TID) | ORAL | Status: DC

## 2018-10-02 MED ORDER — FUROSEMIDE 10 MG/ML IJ SOLN
80.0000 mg | Freq: Once | INTRAMUSCULAR | Status: AC
  Administered 2018-10-02: 80 mg via INTRAVENOUS
  Filled 2018-10-02: qty 8

## 2018-10-02 MED ORDER — LISINOPRIL 5 MG PO TABS
5.0000 mg | ORAL_TABLET | Freq: Every day | ORAL | Status: DC
  Administered 2018-10-02 – 2018-10-06 (×5): 5 mg via ORAL
  Filled 2018-10-02 (×5): qty 1

## 2018-10-02 MED ORDER — CLONIDINE HCL 0.1 MG PO TABS
0.1000 mg | ORAL_TABLET | Freq: Two times a day (BID) | ORAL | Status: DC
  Administered 2018-10-02: 0.1 mg via ORAL
  Filled 2018-10-02 (×2): qty 1

## 2018-10-02 MED ORDER — SODIUM CHLORIDE 0.9% FLUSH: 3.0000 mL | INTRAVENOUS | Status: DC | PRN

## 2018-10-02 MED ORDER — POTASSIUM CHLORIDE CRYS ER 20 MEQ PO TBCR
20.0000 meq | EXTENDED_RELEASE_TABLET | Freq: Once | ORAL | Status: AC
  Administered 2018-10-02: 20 meq via ORAL
  Filled 2018-10-02: qty 1

## 2018-10-02 MED ORDER — DOCUSATE SODIUM 100 MG PO CAPS
100.0000 mg | ORAL_CAPSULE | Freq: Two times a day (BID) | ORAL | Status: DC
  Administered 2018-10-02 – 2018-10-07 (×10): 100 mg via ORAL
  Filled 2018-10-02 (×10): qty 1

## 2018-10-02 MED ORDER — FUROSEMIDE 10 MG/ML IJ SOLN
80.0000 mg | Freq: Two times a day (BID) | INTRAMUSCULAR | Status: DC
  Administered 2018-10-02 – 2018-10-05 (×6): 80 mg via INTRAVENOUS
  Filled 2018-10-02 (×6): qty 8

## 2018-10-02 MED ORDER — ALBUTEROL SULFATE (2.5 MG/3ML) 0.083% IN NEBU: 2.5000 mg | INHALATION_SOLUTION | Freq: Four times a day (QID) | RESPIRATORY_TRACT | Status: DC | PRN

## 2018-10-02 MED ORDER — ONDANSETRON HCL 4 MG/2ML IJ SOLN: 4.0000 mg | Freq: Four times a day (QID) | INTRAMUSCULAR | Status: DC | PRN

## 2018-10-02 MED ORDER — SPIRONOLACTONE 100 MG PO TABS
100.0000 mg | ORAL_TABLET | Freq: Every day | ORAL | Status: DC
  Administered 2018-10-03 – 2018-10-07 (×5): 100 mg via ORAL
  Filled 2018-10-02 (×5): qty 1

## 2018-10-02 MED ORDER — ATORVASTATIN CALCIUM 40 MG PO TABS
40.0000 mg | ORAL_TABLET | Freq: Every day | ORAL | Status: DC
  Administered 2018-10-03 – 2018-10-07 (×5): 40 mg via ORAL
  Filled 2018-10-02 (×5): qty 1

## 2018-10-02 MED ORDER — FLUTICASONE PROPIONATE HFA 220 MCG/ACT IN AERO: 2.0000 | INHALATION_SPRAY | Freq: Every day | RESPIRATORY_TRACT | Status: DC

## 2018-10-02 MED ORDER — BUDESONIDE 0.5 MG/2ML IN SUSP
0.5000 mg | Freq: Two times a day (BID) | RESPIRATORY_TRACT | Status: DC
  Administered 2018-10-02 – 2018-10-07 (×10): 0.5 mg via RESPIRATORY_TRACT
  Filled 2018-10-02 (×10): qty 2

## 2018-10-02 MED ORDER — ACETAMINOPHEN 325 MG PO TABS
650.0000 mg | ORAL_TABLET | Freq: Once | ORAL | Status: AC
  Administered 2018-10-02: 650 mg via ORAL
  Filled 2018-10-02: qty 2

## 2018-10-02 NOTE — ED Provider Notes (Signed)
[facility] EMERGENCY DEPARTMENT Provider Note   CSN: [id number] Arrival date & time: 10/02/18  1005    History   Chief Complaint Chief Complaint  Patient presents with  . Shortness of Breath    Samuel EMONTAE Fitzpatrick is a 53 y.o. male.     Samuel Patient presented to the emergency room with complaints of shortness of breath.  Patient states the symptoms started a couple days ago.  He called EMS today to come into the emergency room for evaluation.  Patient has a history of congestive heart failure.  He complains of swelling in his lower extremities as well as abdominal distention.  He has been coughing and feels short of breath.  Patient denies any fevers or chills.  No chest pain.  Patient was in the hospital earlier this month for CHF exacerbation.  At that time he was admitted for recurrent acute on chronic CHF.  He was diuresed 6.7 L.  Patient does have a history of medical noncompliance and frequent exacerbations of CHF.  Discharge weight was 359 pounds. Past Medical History:  Diagnosis Date  . Allergic rhinitis   . Asthma   . Atrial fibrillation (HCC)   . CHF (congestive heart failure) (HCC)   . Essential hypertension   . Gastroesophageal reflux disease   . Heart attack (HCC)   . History of cardiac catheterization    Normal coronaries December 2016  . Noncompliance    Major problem leading to declining health and recurrent hospitalization  . Nonischemic cardiomyopathy (HCC)    a. LVEF 20-25% in 2017 with cath in 2016 showing normal cors. b. EF 55% by echo in 03/2018.  . On home O2    3L N/C  . OSA (obstructive sleep apnea)   . Osteoarthritis   . Peptic ulcer disease     Patient Active Problem List   Diagnosis Date Noted  . AKI (acute kidney injury) (HCC) 09/18/2018  . Acute on chronic combined systolic and diastolic congestive heart failure (HCC) 08/15/2018  . SOB (shortness of breath) 08/03/2018  . Volume overload 07/22/2018  . Acute systolic CHF (congestive heart  failure) (HCC) 07/04/2018  . Acute on chronic combined systolic and diastolic heart failure (HCC) 04/01/2018  . Hypomagnesemia 04/01/2018  . Acute on chronic diastolic CHF (congestive heart failure) (HCC) 03/11/2018  . Pressure injury of skin 03/11/2018  . CHF (congestive heart failure) (HCC) 09/08/2017  . Altered mental status 05/11/2017  . Acute exacerbation of CHF (congestive heart failure) (HCC) 04/20/2017  . Acute on chronic systolic (congestive) heart failure (HCC) 04/19/2017  . CHF exacerbation (HCC) 04/11/2017  . Chronic respiratory failure with hypoxia (HCC) 04/11/2017  . Encounter for hospice care discussion   . Palliative care encounter   . Goals of care, counseling/discussion   . DNR (do not resuscitate)   . DNR (do not resuscitate) discussion   . Chronic congestive heart failure (HCC) 02/18/2017  . Acute systolic heart failure (HCC) 01/18/2017  . Pedal edema 12/02/2016  . Acute CHF (congestive heart failure) (HCC) 11/09/2016  . Acute on chronic systolic CHF (congestive heart failure) (HCC) 08/19/2016  . CKD (chronic kidney disease), stage II 05/17/2016  . Coronary arteries, normal Dec 2016 01/23/2016  . Chronic anticoagulation-Xarelto 01/23/2016  . NSVT (nonsustained ventricular tachycardia) (HCC) 01/23/2016  . Elevated troponin 12/21/2015  . Nonischemic cardiomyopathy (HCC)   . Morbid obesity due to excess calories (HCC)   . Noncompliance with diet and medication regimen 12/02/2015  . OSA (obstructive sleep apnea) 09/20/2015  .  Pulmonary hypertension (HCC) 09/19/2015  . Normocytic anemia 09/19/2015  . Atrial fibrillation, chronic   . Dyspnea on exertion 05/28/2015  . Acute respiratory failure with hypoxia (HCC) 04/01/2015  . Hypokalemia 04/01/2015  . Obesity hypoventilation syndrome (HCC) 08/08/2014  . GERD (gastroesophageal reflux disease) 08/08/2014  . Edema of right lower extremity 06/21/2014  . Fecal urgency 06/21/2014  . Asthma 02/11/2009  . Hyperlipidemia  07/12/2008  . OBESITY, MORBID 07/12/2008  . Anxiety state 07/12/2008  . DEPRESSION 07/12/2008  . Essential hypertension 07/12/2008  . ALLERGIC RHINITIS 07/12/2008  . Peptic ulcer 07/12/2008  . OSTEOARTHRITIS 07/12/2008    Past Surgical History:  Procedure Laterality Date  . CARDIAC CATHETERIZATION N/A 06/14/2015   Procedure: Right/Left Heart Cath and Coronary Angiography;  Surgeon: Runell Gess, MD;  Location: [facility] INVASIVE CV LAB;  Service: Cardiovascular;  Laterality: N/A;        Home Medications    Prior to Admission medications   Medication Sig Start Date End Date Taking? Authorizing Provider  albuterol (PROVENTIL) (2.5 MG/3ML) 0.083% nebulizer solution Take 2.5 mg by nebulization every 6 (six) hours as needed for wheezing or shortness of breath.   Yes [provider]  atorvastatin (LIPITOR) 40 MG tablet Take 40 mg by mouth daily.  09/05/18  Yes [provider]  bumetanide (BUMEX) 2 MG tablet Take 1 tablet (2 mg total) by mouth 2 (two) times daily. 09/03/18  Yes Tat, Onalee Hua, MD  carvedilol (COREG) 25 MG tablet Take 1 tablet (25 mg total) by mouth 2 (two) times daily with a meal for 30 days. Patient taking differently: Take 25 mg by mouth daily.  08/18/18 10/02/18 Yes Shah, Pratik D, DO  docusate sodium (COLACE) 100 MG capsule Take 1 capsule (100 mg total) by mouth 2 (two) times daily. 06/25/16  Yes Kathlen Mody, MD  ferrous gluconate (FERGON) 324 MG tablet Take 1 tablet (324 mg total) by mouth 2 (two) times daily with a meal. 06/25/16  Yes Kathlen Mody, MD  fluticasone (FLOVENT HFA) 220 MCG/ACT inhaler Inhale 2 puffs into the lungs daily.    Yes [provider]  lisinopril (PRINIVIL,ZESTRIL) 5 MG tablet Take 1 tablet (5 mg total) by mouth daily. 09/03/18  Yes Tat, Onalee Hua, MD  pantoprazole (PROTONIX) 40 MG tablet Take 1 tablet (40 mg total) by mouth daily at 6 (six) AM. Patient taking differently: Take 40 mg by mouth every evening.  06/26/16  Yes Kathlen Mody,  MD  potassium chloride SA (K-DUR,KLOR-CON) 20 MEQ tablet Take 1 tablet (20 mEq total) by mouth 2 (two) times daily. 06/13/18  Yes Sherryll Burger, Pratik D, DO  rivaroxaban (XARELTO) 20 MG TABS tablet Take 1 tablet (20 mg total) by mouth daily with breakfast. Patient taking differently: Take 20 mg by mouth daily with supper.  06/26/16  Yes Kathlen Mody, MD  spironolactone (ALDACTONE) 100 MG tablet Take 100 mg by mouth daily. For fluid 03/18/17  Yes [provider]  tamsulosin (FLOMAX) 0.4 MG CAPS capsule Take 1 capsule (0.4 mg total) by mouth daily. 06/25/16  Yes Kathlen Mody, MD  OXYGEN Inhale 3.5 L into the lungs daily.    [provider]  potassium chloride 20 MEQ TBCR Take 20 mEq by mouth 2 (two) times daily. 09/03/15 09/03/15  Ward, Layla Maw, DO    Family History Family History  Problem Relation Age of Onset  . Stroke Father   . Heart attack Father   . Aneurysm Mother        Cerebral aneurysm  .  Hypertension Sister   . Colon cancer Neg Hx   . Inflammatory bowel disease Neg Hx   . Liver disease Neg Hx     Social History Social History   Tobacco Use  . Smoking status: Former Smoker    Packs/day: 0.50    Years: 20.00    Pack years: 10.00    Types: Cigarettes    Start date: 04/26/1988    Last attempt to quit: 06/23/2007    Years since quitting: 11.2  . Smokeless tobacco: Never Used  . Tobacco comment: 1 ppd former smoker  Substance Use Topics  . Alcohol use: No    Alcohol/week: 0.0 standard drinks    Comment: No etoh since 2009  . Drug use: No     Allergies   Banana; Bee venom; Food; Aspirin; Metolazone; Oatmeal; Orange juice [orange oil]; Torsemide; Diltiazem; Hydralazine; and Lipitor [atorvastatin]   Review of Systems Review of Systems  All other systems reviewed and are negative.    Physical Exam Updated Vital Signs BP (!) 139/100   Pulse 97   Temp 98.2 F (36.8 C) (Oral)   Resp 19   Ht 1.829 m (6')   Wt (!) 178 kg   SpO2 96%   BMI 53.22 kg/m    Physical Exam Vitals signs and nursing note reviewed.  Constitutional:      General: He is not in acute distress.    Appearance: He is obese.  HENT:     Head: Normocephalic and atraumatic.     Right Ear: External ear normal.     Left Ear: External ear normal.  Eyes:     General: No scleral icterus.       Right eye: No discharge.        Left eye: No discharge.     Conjunctiva/sclera: Conjunctivae normal.  Neck:     Musculoskeletal: Neck supple.     Trachea: No tracheal deviation.  Cardiovascular:     Rate and Rhythm: Normal rate and regular rhythm.  Pulmonary:     Effort: Pulmonary effort is normal. No respiratory distress.     Breath sounds: Normal breath sounds. No stridor. No wheezing or rales.  Abdominal:     General: Bowel sounds are normal. There is no distension.     Palpations: Abdomen is soft.     Tenderness: There is no abdominal tenderness. There is no guarding or rebound.  Musculoskeletal:        General: No tenderness.     Right lower leg: Edema present.     Left lower leg: Edema present.     Comments: Severe edema bilateral lower extremities  Skin:    General: Skin is warm and dry.     Findings: No rash.  Neurological:     Cranial Nerves: No cranial nerve deficit (no facial droop, extraocular movements intact, no slurred speech).     Sensory: No sensory deficit.     Motor: No abnormal muscle tone or seizure activity.     Coordination: Coordination normal.      ED Treatments / Results  Labs (all labs ordered are listed, but only abnormal results are displayed) Labs Reviewed  BASIC METABOLIC PANEL - Abnormal; Notable for the following components:      Result Value   Glucose, Bld 110 (*)    Calcium 8.5 (*)    All other components within normal limits  BRAIN NATRIURETIC PEPTIDE - Abnormal; Notable for the following components:   B Natriuretic Peptide 328.0 (*)  All other components within normal limits  CBC - Abnormal; Notable for the following  components:   Hemoglobin 12.3 (*)    Platelets 147 (*)    All other components within normal limits  MAGNESIUM    EKG EKG Interpretation  Date/Time:  Sunday October 02 2018 10:10:14 EDT Ventricular Rate:  87 PR Interval:    QRS Duration: 95 QT Interval:  376 QTC Calculation: 453 R Axis:   57 Text Interpretation:  Atrial fibrillation Ventricular premature complex Nonspecific T abnormalities, lateral leads No significant change since last tracing Confirmed by Linwood Dibbles [phone]) on 10/02/2018 10:29:51 AM   Radiology Dg Chest Portable 1 View  Result Date: 10/02/2018 CLINICAL DATA:  Dyspnea, lower extremity edema EXAM: PORTABLE CHEST 1 VIEW COMPARISON:  09/18/2018 chest radiograph. FINDINGS: Stable cardiomediastinal silhouette with moderate cardiomegaly. No pneumothorax. No pleural effusion. Mild pulmonary edema. IMPRESSION: Mild congestive heart failure. Electronically Signed   By: Delbert Phenix M.D.   On: 10/02/2018 11:30    Procedures Procedures (including critical care time)  Medications Ordered in ED Medications  furosemide (LASIX) injection 80 mg (has no administration in time range)  potassium chloride SA (K-DUR,KLOR-CON) CR tablet 20 mEq (20 mEq Oral Given 10/02/18 1104)  bumetanide (BUMEX) tablet 2 mg (2 mg Oral Given 10/02/18 1105)     Initial Impression / Assessment and Plan / ED Course  I have reviewed the triage vital signs and the nursing notes.  Pertinent labs & imaging results that were available during my care of the patient were reviewed by me and considered in my medical decision making (see chart for details).  Clinical Course as of Oct 01 1149  Sun Oct 02, 2018  1148 Labs reviewed   [JK]  1148 BNP elevated but similar to baseline   [JK]  1148 CHF noted on chest x-ray.   [JK]  1150 The patient's weight today is 178 kg.  His discharge weight was 163 kg from his previous admission.  Patient has been   [JK]    Clinical Course User Index [JK] Linwood Dibbles, MD      Patient presented to the emergency room for evaluation of shortness of breath.  Patient has a history of CHF and noncompliance.  Patient presented for worsening shortness of breath and edema.  Patient does have mild CHF on x-ray.  Notably however he has significant gaining weight.  I have ordered IV and oral diuretics.  I think he would benefit from admission for diuresis and to get him back to his baseline weight. Final Clinical Impressions(s) / ED Diagnoses   Final diagnoses:  Acute on chronic combined systolic and diastolic CHF (congestive heart failure) (HCC)      Linwood Dibbles, MD 10/02/18 1153

## 2018-10-02 NOTE — H&P (Signed)
History and Physical    [name] STM:[id number] DOB: 12-11-1965 DOA: 10/02/2018  PCP: Pearson Grippe, MD   Patient coming from: Home  Chief Complaint: Dyspnea/LE edema  HPI: [name] is a 53 y.o. male with medical history significant for chronic combined systolic and diastolic CHF with last echo on 03/2018 with LVEF 55%, atrial fibrillation on Xarelto, hypertension, morbid obesity, GERD, and dyslipidemia who presents back to the ED after recent discharge on 3/31 for hospitalization of heart failure exacerbation at that time.  His discharge weight at that time was 359 pounds and he is noted to be 392 pounds today.  He was noted to have a significant history of medical noncompliance and also dietary noncompliance.  He states that his Bumex does not work for him and he was increased to 3 times daily dosing on 4/6 on account of worsening edema and weight gain and states that he was taking his medications 3 times a day, but did not have much in the way of results.  He states in the last 2 days he has had worsening dyspnea and edema which prompted the visit.  He denies any significant coughing, fevers, chills, nausea, vomiting, or other symptomatology.   ED Course: Vital signs are stable, but with elevated blood pressure readings which are typical for him with volume overload.  EKG with atrial fibrillation at 87 bpm and chest x-ray with mild CHF noted.  Lab work is otherwise stable and BNP is at 328.  He has been started on Lasix 80 mg at this time.  Review of Systems: All others reviewed and otherwise negative.  Past Medical History:  Diagnosis Date  . Allergic rhinitis   . Asthma   . Atrial fibrillation (HCC)   . CHF (congestive heart failure) (HCC)   . Essential hypertension   . Gastroesophageal reflux disease   . Heart attack (HCC)   . History of cardiac catheterization    Normal coronaries December 2016  . Noncompliance    Major problem leading to declining health and recurrent  hospitalization  . Nonischemic cardiomyopathy (HCC)    a. LVEF 20-25% in 2017 with cath in 2016 showing normal cors. b. EF 55% by echo in 03/2018.  . On home O2    3L N/C  . OSA (obstructive sleep apnea)   . Osteoarthritis   . Peptic ulcer disease     Past Surgical History:  Procedure Laterality Date  . CARDIAC CATHETERIZATION N/A 06/14/2015   Procedure: Right/Left Heart Cath and Coronary Angiography;  Surgeon: Runell Gess, MD;  Location: [facility] INVASIVE CV LAB;  Service: Cardiovascular;  Laterality: N/A;     reports that he quit smoking about 11 years ago. His smoking use included cigarettes. He started smoking about 30 years ago. He has a 10.00 pack-year smoking history. He has never used smokeless tobacco. He reports that he does not drink alcohol or use drugs.  Allergies  Allergen Reactions  . Banana Shortness Of Breath  . Bee Venom Shortness Of Breath, Swelling and Other (See Comments)    Reaction:  Facial swelling  . Food Shortness Of Breath, Rash and Other (See Comments)    Pt states that he is allergic to strawberries.    . Aspirin Other (See Comments)    Reaction:  GI upset   . Metolazone Other (See Comments)    Pt states that he stopped taking this med due to heart attack like symptoms.   . Oatmeal Nausea And Vomiting  .  Orange Juice [Orange Oil] Nausea And Vomiting    All acidic products make him nauseous and upset stomach  . Torsemide Swelling and Other (See Comments)    Reaction:  Swelling of feet/legs   . Diltiazem Palpitations  . Hydralazine Palpitations  . Lipitor [Atorvastatin] Other (See Comments)    Reaction:  Nose bleeds     Family History  Problem Relation Age of Onset  . Stroke Father   . Heart attack Father   . Aneurysm Mother        Cerebral aneurysm  . Hypertension Sister   . Colon cancer Neg Hx   . Inflammatory bowel disease Neg Hx   . Liver disease Neg Hx     Prior to Admission medications   Medication Sig Start Date End Date Taking?  Authorizing Provider  albuterol (PROVENTIL) (2.5 MG/3ML) 0.083% nebulizer solution Take 2.5 mg by nebulization every 6 (six) hours as needed for wheezing or shortness of breath.   Yes [provider]  atorvastatin (LIPITOR) 40 MG tablet Take 40 mg by mouth daily.  09/05/18  Yes [provider]  bumetanide (BUMEX) 2 MG tablet Take 1 tablet (2 mg total) by mouth 2 (two) times daily. 09/03/18  Yes Tat, Onalee Hua, MD  carvedilol (COREG) 25 MG tablet Take 1 tablet (25 mg total) by mouth 2 (two) times daily with a meal for 30 days. Patient taking differently: Take 25 mg by mouth daily.  08/18/18 10/02/18 Yes Juandiego Kolenovic D, DO  docusate sodium (COLACE) 100 MG capsule Take 1 capsule (100 mg total) by mouth 2 (two) times daily. 06/25/16  Yes Kathlen Mody, MD  ferrous gluconate (FERGON) 324 MG tablet Take 1 tablet (324 mg total) by mouth 2 (two) times daily with a meal. 06/25/16  Yes Kathlen Mody, MD  fluticasone (FLOVENT HFA) 220 MCG/ACT inhaler Inhale 2 puffs into the lungs daily.    Yes [provider]  lisinopril (PRINIVIL,ZESTRIL) 5 MG tablet Take 1 tablet (5 mg total) by mouth daily. 09/03/18  Yes Tat, Onalee Hua, MD  pantoprazole (PROTONIX) 40 MG tablet Take 1 tablet (40 mg total) by mouth daily at 6 (six) AM. Patient taking differently: Take 40 mg by mouth every evening.  06/26/16  Yes Kathlen Mody, MD  potassium chloride SA (K-DUR,KLOR-CON) 20 MEQ tablet Take 1 tablet (20 mEq total) by mouth 2 (two) times daily. 06/13/18  Yes Sherryll Burger, Josilynn Losh D, DO  rivaroxaban (XARELTO) 20 MG TABS tablet Take 1 tablet (20 mg total) by mouth daily with breakfast. Patient taking differently: Take 20 mg by mouth daily with supper.  06/26/16  Yes Kathlen Mody, MD  spironolactone (ALDACTONE) 100 MG tablet Take 100 mg by mouth daily. For fluid 03/18/17  Yes [provider]  tamsulosin (FLOMAX) 0.4 MG CAPS capsule Take 1 capsule (0.4 mg total) by mouth daily. 06/25/16  Yes Kathlen Mody, MD  OXYGEN Inhale 3.5 L  into the lungs daily.    [provider]  potassium chloride 20 MEQ TBCR Take 20 mEq by mouth 2 (two) times daily. 09/03/15 09/03/15  Ward, Layla Maw, DO    Physical Exam: Vitals:   10/02/18 1115 10/02/18 1130 10/02/18 1140 10/02/18 1200  BP:    (!) 148/114  Pulse: 91 97  93  Resp: 18 19  (!) 22  Temp:      TempSrc:      SpO2: 96% 96%  96%  Weight:   (!) 178 kg   Height:  Constitutional: NAD, calm, comfortable, morbidly obese Vitals:   10/02/18 1115 10/02/18 1130 10/02/18 1140 10/02/18 1200  BP:    (!) 148/114  Pulse: 91 97  93  Resp: 18 19  (!) 22  Temp:      TempSrc:      SpO2: 96% 96%  96%  Weight:   (!) 178 kg   Height:       Eyes: lids and conjunctivae normal ENMT: Mucous membranes are moist.  Neck: normal, supple Respiratory: clear to auscultation bilaterally. Normal respiratory effort. No accessory muscle use.  Currently on room air Cardiovascular: Regular rate and rhythm, no murmurs.  Bilateral 1-2+ pitting edema noted.  Atrial fibrillation rate controlled. Abdomen: no tenderness, no distention. Bowel sounds positive.  Musculoskeletal:  No joint deformity upper and lower extremities.   Skin: no rashes, lesions, ulcers.  Psychiatric: Normal judgment and insight. Alert and oriented x 3. Normal mood.   Labs on Admission: I have personally reviewed following labs and imaging studies  CBC: Recent Labs  Lab 10/02/18 1034  WBC 4.2  HGB 12.3*  HCT 39.0  MCV 88.8  PLT 147*   Basic Metabolic Panel: Recent Labs  Lab 10/02/18 1034  NA 141  K 3.5  CL 108  CO2 25  GLUCOSE 110*  BUN 16  CREATININE 1.16  CALCIUM 8.5*  MG 2.0   GFR: Estimated Creatinine Clearance: 126.9 mL/min (by C-G formula based on SCr of 1.16 mg/dL). Liver Function Tests: No results for input(s): AST, ALT, ALKPHOS, BILITOT, PROT, ALBUMIN in the last 168 hours. No results for input(s): LIPASE, AMYLASE in the last 168 hours. No results for input(s): AMMONIA in the last 168  hours. Coagulation Profile: No results for input(s): INR, PROTIME in the last 168 hours. Cardiac Enzymes: No results for input(s): CKTOTAL, CKMB, CKMBINDEX, TROPONINI in the last 168 hours. BNP (last 3 results) No results for input(s): PROBNP in the last 8760 hours. HbA1C: No results for input(s): HGBA1C in the last 72 hours. CBG: No results for input(s): GLUCAP in the last 168 hours. Lipid Profile: No results for input(s): CHOL, HDL, LDLCALC, TRIG, CHOLHDL, LDLDIRECT in the last 72 hours. Thyroid Function Tests: No results for input(s): TSH, T4TOTAL, FREET4, T3FREE, THYROIDAB in the last 72 hours. Anemia Panel: No results for input(s): VITAMINB12, FOLATE, FERRITIN, TIBC, IRON, RETICCTPCT in the last 72 hours. Urine analysis:    Component Value Date/Time   COLORURINE YELLOW 04/01/2018 0332   APPEARANCEUR CLEAR 04/01/2018 0332   LABSPEC 1.008 04/01/2018 0332   PHURINE 7.0 04/01/2018 0332   GLUCOSEU NEGATIVE 04/01/2018 0332   GLUCOSEU NEG mg/dL 93/04/2161 4469   HGBUR NEGATIVE 04/01/2018 0332   BILIRUBINUR NEGATIVE 04/01/2018 0332   KETONESUR NEGATIVE 04/01/2018 0332   PROTEINUR NEGATIVE 04/01/2018 0332   UROBILINOGEN 0.2 08/08/2014 1445   NITRITE NEGATIVE 04/01/2018 0332   LEUKOCYTESUR NEGATIVE 04/01/2018 0332    Radiological Exams on Admission: Dg Chest Portable 1 View  Result Date: 10/02/2018 CLINICAL DATA:  Dyspnea, lower extremity edema EXAM: PORTABLE CHEST 1 VIEW COMPARISON:  09/18/2018 chest radiograph. FINDINGS: Stable cardiomediastinal silhouette with moderate cardiomegaly. No pneumothorax. No pleural effusion. Mild pulmonary edema. IMPRESSION: Mild congestive heart failure. Electronically Signed   By: Delbert Phenix M.D.   On: 10/02/2018 11:30    EKG: Independently reviewed.  Atrial fibrillation 87 bpm.  Assessment/Plan Principal Problem:   Acute on chronic combined systolic and diastolic CHF (congestive heart failure) (HCC) Active Problems:   OBESITY, MORBID    Essential hypertension  Atrial fibrillation, chronic    1. Acute on chronic combined systolic and diastolic heart failure exacerbation-recurrent secondary to medication and dietary noncompliance.  Placed on Lasix 80 mg IV twice daily which has already been started in the ED.  Monitor strict I's and O's as well as daily weights.  Monitor daily labs. 2. Elevated blood pressure in the setting of hypertension.  Aggressive IV diuresis with hydralazine ordered as needed for severe elevations. 3. Dyslipidemia.  Continue statin. 4. Atrial fibrillation.  Continue on Xarelto for anticoagulation and monitor on telemetry. 5. GERD.  PPI. 6. CKD stage II.  Monitor on repeat lab work.  Currently stable.   DVT prophylaxis: Xarelto Code Status: Full Family Communication: None at bedside Disposition Plan: Admit for IV diuresis Consults called: None Admission status: Inpatient, telemetry   Martina Brodbeck Hoover BrunetteD Jasmin Trumbull DO [facility] Pager [phone]  If 7PM-7AM, please contact night-coverage [email or web address] Password [facility] [email or web address]  10/02/2018, 12:20 PM

## 2018-10-02 NOTE — ED Triage Notes (Signed)
Per EMS, pt called out for SOB x 2 days. Pt noted to have bilateral lower extremity edema and distended abdomen. Sats 92% on RA, with 3L supplemental O2, sats increased to 97%. Dyspnea at rest with audible wheezing. Hx of CHF and COPD. Denies CP. EMS reports intermittent periods of trigeminy on EKG strip.

## 2018-10-02 NOTE — ED Notes (Signed)
Lab at bedside

## 2018-10-02 NOTE — ED Notes (Signed)
X-ray at bedside

## 2018-10-03 ENCOUNTER — Ambulatory Visit: Payer: Self-pay

## 2018-10-03 ENCOUNTER — Telehealth: Payer: Medicare Other | Admitting: Cardiovascular Disease

## 2018-10-03 ENCOUNTER — Telehealth: Payer: Medicare Other | Admitting: Physician Assistant

## 2018-10-03 LAB — BASIC METABOLIC PANEL
Anion gap: 8 (ref 5–15)
BUN: 15 mg/dL (ref 6–20)
CO2: 27 mmol/L (ref 22–32)
Calcium: 8.3 mg/dL — ABNORMAL LOW (ref 8.9–10.3)
Chloride: 105 mmol/L (ref 98–111)
Creatinine, Ser: 1.07 mg/dL (ref 0.61–1.24)
GFR calc Af Amer: >60 mL/min (ref >60)
GFR calc non Af Amer: >60 mL/min (ref >60)
Glucose, Bld: 93 mg/dL (ref 70–99)
Potassium: 3.4 mmol/L — ABNORMAL LOW (ref 3.5–5.1)
Sodium: 140 mmol/L (ref 135–145)

## 2018-10-03 LAB — CBC
HCT: 40.2 % (ref 39.0–52.0)
Hemoglobin: 12.5 g/dL — ABNORMAL LOW (ref 13.0–17.0)
MCH: 27.7 pg (ref 26.0–34.0)
MCHC: 31.1 g/dL (ref 30.0–36.0)
MCV: 89.1 fL (ref 80.0–100.0)
Platelets: 178 10*3/uL (ref 150–400)
RBC: 4.51 MIL/uL (ref 4.22–5.81)
RDW: 13.9 % (ref 11.5–15.5)
WBC: 4.3 10*3/uL (ref 4.0–10.5)
nRBC: 0 % (ref 0.0–0.2)

## 2018-10-03 LAB — MAGNESIUM: Magnesium: 2 mg/dL (ref 1.7–2.4)

## 2018-10-03 MED ORDER — POTASSIUM CHLORIDE CRYS ER 20 MEQ PO TBCR: 40.0000 meq | EXTENDED_RELEASE_TABLET | Freq: Three times a day (TID) | ORAL | Status: DC

## 2018-10-03 MED ORDER — POTASSIUM CHLORIDE CRYS ER 20 MEQ PO TBCR
40.0000 meq | EXTENDED_RELEASE_TABLET | Freq: Two times a day (BID) | ORAL | Status: DC
  Administered 2018-10-03 – 2018-10-07 (×9): 40 meq via ORAL
  Filled 2018-10-03 (×9): qty 2

## 2018-10-03 NOTE — TOC Initial Note (Signed)
Transition of Care [facility]) - Initial/Assessment Note    Patient Details  Name: [name] MRN: [id number] Date of Birth: 02-27-1966  Transition of Care [facility] CM/SW Contact:    Ida Rogue, LCSW Phone Number: 10/03/2018, 12:12 PM  Clinical Narrative:    Larone lives at home with his mother, who does the grocery shopping, and his 2 off spring.  He states he has a scales and reports his weight regularly to [facility] nurse.  He also states that he tries to adhere to a low sodium diet, though also states that when he tried to follow it religiously in the past, "it didn't make any difference."  He also states that he takes his meds as prescribed, but they don't work as well as coming to the hospital and taking our meds.  "It's just not the same."  Unable to identify anything that he will do differently when he returns home.  Denies any needs other than continuing with [facility].   CSW to contact nurse.                 Expected Discharge Plan: Home/Self Care Barriers to Discharge: No Barriers Identified   Patient Goals and CMS Choice Patient states their goals for this hospitalization and ongoing recovery are:: Get this good medication that works to get off the fluid      Expected Discharge Plan and Services Expected Discharge Plan: Home/Self Care   Discharge Planning Services: Other - See comment([facility] patient)   Living arrangements for the past 2 months: Single Family Home Expected Discharge Date: 10/05/18                        Prior Living Arrangements/Services Living arrangements for the past 2 months: Single Family Home Lives with:: Adult Children, Parents Patient language and need for interpreter reviewed:: Yes Do you feel safe going back to the place where you live?: Yes      Need for Family Participation in Patient Care: No (Comment) Care giver support system in place?: No (comment)   Criminal Activity/Legal Involvement Pertinent to Current Situation/Hospitalization: No - Comment as  needed  Activities of Daily Living Home Assistive Devices/Equipment: Oxygen, Hospital bed ADL Screening (condition at time of admission) Patient's cognitive ability adequate to safely complete daily activities?: Yes Is the patient deaf or have difficulty hearing?: No Does the patient have difficulty seeing, even when wearing glasses/contacts?: No Does the patient have difficulty concentrating, remembering, or making decisions?: No Patient able to express need for assistance with ADLs?: Yes Does the patient have difficulty dressing or bathing?: No Independently performs ADLs?: Yes (appropriate for developmental age) Does the patient have difficulty walking or climbing stairs?: Yes Weakness of Legs: Both Weakness of Arms/Hands: None  Permission Sought/Granted                  Emotional Assessment Appearance:: Appears stated age Attitude/Demeanor/Rapport: Engaged Affect (typically observed): Pleasant Orientation: : Oriented to Self, Oriented to Situation, Oriented to Place, Oriented to  Time Alcohol / Substance Use: Not Applicable Psych Involvement: No (comment)  Admission diagnosis:  Acute on chronic combined systolic and diastolic CHF (congestive heart failure) (HCC) [I50.43] Patient Active Problem List   Diagnosis Date Noted  . Acute on chronic combined systolic and diastolic CHF (congestive heart failure) (HCC) 10/02/2018  . AKI (acute kidney injury) (HCC) 09/18/2018  . Acute on chronic combined systolic and diastolic congestive heart failure (HCC) 08/15/2018  . SOB (shortness of breath)  08/03/2018  . Volume overload 07/22/2018  . Acute systolic CHF (congestive heart failure) (HCC) 07/04/2018  . Acute on chronic combined systolic and diastolic heart failure (HCC) 04/01/2018  . Hypomagnesemia 04/01/2018  . Acute on chronic diastolic CHF (congestive heart failure) (HCC) 03/11/2018  . Pressure injury of skin 03/11/2018  . CHF (congestive heart failure) (HCC) 09/08/2017  .  Altered mental status 05/11/2017  . Acute exacerbation of CHF (congestive heart failure) (HCC) 04/20/2017  . Acute on chronic systolic (congestive) heart failure (HCC) 04/19/2017  . CHF exacerbation (HCC) 04/11/2017  . Chronic respiratory failure with hypoxia (HCC) 04/11/2017  . Encounter for hospice care discussion   . Palliative care encounter   . Goals of care, counseling/discussion   . DNR (do not resuscitate)   . DNR (do not resuscitate) discussion   . Chronic congestive heart failure (HCC) 02/18/2017  . Acute systolic heart failure (HCC) 01/18/2017  . Pedal edema 12/02/2016  . Acute CHF (congestive heart failure) (HCC) 11/09/2016  . Acute on chronic systolic CHF (congestive heart failure) (HCC) 08/19/2016  . CKD (chronic kidney disease), stage II 05/17/2016  . Coronary arteries, normal Dec 2016 01/23/2016  . Chronic anticoagulation-Xarelto 01/23/2016  . NSVT (nonsustained ventricular tachycardia) (HCC) 01/23/2016  . Elevated troponin 12/21/2015  . Nonischemic cardiomyopathy (HCC)   . Morbid obesity due to excess calories (HCC)   . Noncompliance with diet and medication regimen 12/02/2015  . OSA (obstructive sleep apnea) 09/20/2015  . Pulmonary hypertension (HCC) 09/19/2015  . Normocytic anemia 09/19/2015  . Atrial fibrillation, chronic   . Dyspnea on exertion 05/28/2015  . Acute respiratory failure with hypoxia (HCC) 04/01/2015  . Hypokalemia 04/01/2015  . Obesity hypoventilation syndrome (HCC) 08/08/2014  . GERD (gastroesophageal reflux disease) 08/08/2014  . Edema of right lower extremity 06/21/2014  . Fecal urgency 06/21/2014  . Asthma 02/11/2009  . Hyperlipidemia 07/12/2008  . OBESITY, MORBID 07/12/2008  . Anxiety state 07/12/2008  . DEPRESSION 07/12/2008  . Essential hypertension 07/12/2008  . ALLERGIC RHINITIS 07/12/2008  . Peptic ulcer 07/12/2008  . OSTEOARTHRITIS 07/12/2008   PCP:  Pearson Grippe, MD Pharmacy:   [facility] [phone] - [place], [place] - [address] [place] [address] Phone: [phone] Fax: [phone]     Social Determinants of Health (SDOH) Interventions    Readmission Risk Interventions No flowsheet data found.

## 2018-10-03 NOTE — Progress Notes (Signed)
PROGRESS NOTE    [name]  WSF:[id number] DOB: March 09, 1966 DOA: 10/02/2018 PCP: Pearson Grippe, MD   Brief Narrative:  Per HPI: [name] is a 53 y.o. male with medical history significant for chronic combined systolic and diastolic CHF with last echo on 03/2018 with LVEF 55%, atrial fibrillation on Xarelto, hypertension, morbid obesity, GERD, and dyslipidemia who presents back to the ED after recent discharge on 3/31 for hospitalization of heart failure exacerbation at that time.  His discharge weight at that time was 359 pounds and he is noted to be 392 pounds today.  He was noted to have a significant history of medical noncompliance and also dietary noncompliance.  He states that his Bumex does not work for him and he was increased to 3 times daily dosing on 4/6 on account of worsening edema and weight gain and states that he was taking his medications 3 times a day, but did not have much in the way of results.  He states in the last 2 days he has had worsening dyspnea and edema which prompted the visit.  He denies any significant coughing, fevers, chills, nausea, vomiting, or other symptomatology.  Patient admitted for acute on chronic combined systolic and diastolic heart failure exacerbation secondary to medication and dietary noncompliance.  Assessment & Plan:   Principal Problem:   Acute on chronic combined systolic and diastolic CHF (congestive heart failure) (HCC) Active Problems:   OBESITY, MORBID   Essential hypertension   Atrial fibrillation, chronic  1. Acute on chronic combined systolic and diastolic heart failure exacerbation-recurrent secondary to medication and dietary noncompliance-improving.  Continue IV Lasix 80 mg twice daily along with spironolactone and continue to monitor I's and O's as well as daily weights.  Weight today down to 374 pounds with baseline noted to be near 360.  5 L net negative so far. 2. Elevated blood pressure in the setting of hypertension.   Improved this morning after diuresis.  Continue to monitor closely and maintain on home medications. 3. Dyslipidemia.  Continue statin. 4. Atrial fibrillation.  Continue on Xarelto for anticoagulation and monitor on telemetry. 5. GERD.  PPI. 6. CKD stage II.  Monitor on repeat lab work.  Currently stable.   DVT prophylaxis: Xarelto Code Status: Full Family Communication: None at bedside Disposition Plan: Continue IV diuresis with good response noted.   Consultants:   None  Procedures:   None  Antimicrobials:   None   Subjective: Patient seen and evaluated today with no new acute complaints or concerns. No acute concerns or events noted overnight.  He has diuresed quite well overnight with approximately 5 L of output noted.  He started to feel much better.  Objective: Vitals:   10/02/18 2200 10/03/18 0426 10/03/18 0557 10/03/18 0722  BP: (!) 144/97  (!) 152/88   Pulse: 88  67   Resp: (!) 24  (!) 21   Temp: 98.8 F (37.1 C)  98.2 F (36.8 C)   TempSrc: Oral  Oral   SpO2: 100%  96% 93%  Weight:  (!) 170 kg    Height:        Intake/Output Summary (Last 24 hours) at 10/03/2018 0816 Last data filed at 10/03/2018 0558 Gross per 24 hour  Intake 960 ml  Output 6025 ml  Net -5065 ml   Filed Weights   10/02/18 1140 10/02/18 1345 10/03/18 0426  Weight: (!) 178 kg (!) 190.3 kg (!) 170 kg    Examination:  General exam: Appears calm  and comfortable  Respiratory system: Clear to auscultation. Respiratory effort normal. Cardiovascular system: S1 & S2 heard, RRR. No JVD, murmurs, rubs, gallops or clicks.  1+ pedal edema. Gastrointestinal system: Abdomen is nondistended, soft and nontender. No organomegaly or masses felt. Normal bowel sounds heard. Central nervous system: Alert and oriented. No focal neurological deficits. Extremities: Symmetric 5 x 5 power. Skin: No rashes, lesions or ulcers Psychiatry: Judgement and insight appear normal. Mood & affect appropriate.      Data Reviewed: I have personally reviewed following labs and imaging studies  CBC: Recent Labs  Lab 10/02/18 1034 10/03/18 0511  WBC 4.2 4.3  HGB 12.3* 12.5*  HCT 39.0 40.2  MCV 88.8 89.1  PLT 147* 178   Basic Metabolic Panel: Recent Labs  Lab 10/02/18 1034 10/03/18 0511  NA 141 140  K 3.5 3.4*  CL 108 105  CO2 25 27  GLUCOSE 110* 93  BUN 16 15  CREATININE 1.16 1.07  CALCIUM 8.5* 8.3*  MG 2.0 2.0   GFR: Estimated Creatinine Clearance: 133.9 mL/min (by C-G formula based on SCr of 1.07 mg/dL). Liver Function Tests: No results for input(s): AST, ALT, ALKPHOS, BILITOT, PROT, ALBUMIN in the last 168 hours. No results for input(s): LIPASE, AMYLASE in the last 168 hours. No results for input(s): AMMONIA in the last 168 hours. Coagulation Profile: No results for input(s): INR, PROTIME in the last 168 hours. Cardiac Enzymes: No results for input(s): CKTOTAL, CKMB, CKMBINDEX, TROPONINI in the last 168 hours. BNP (last 3 results) No results for input(s): PROBNP in the last 8760 hours. HbA1C: No results for input(s): HGBA1C in the last 72 hours. CBG: No results for input(s): GLUCAP in the last 168 hours. Lipid Profile: No results for input(s): CHOL, HDL, LDLCALC, TRIG, CHOLHDL, LDLDIRECT in the last 72 hours. Thyroid Function Tests: No results for input(s): TSH, T4TOTAL, FREET4, T3FREE, THYROIDAB in the last 72 hours. Anemia Panel: No results for input(s): VITAMINB12, FOLATE, FERRITIN, TIBC, IRON, RETICCTPCT in the last 72 hours. Sepsis Labs: No results for input(s): PROCALCITON, LATICACIDVEN in the last 168 hours.  No results found for this or any previous visit (from the past 240 hour(s)).       Radiology Studies: Dg Chest Portable 1 View  Result Date: 10/02/2018 CLINICAL DATA:  Dyspnea, lower extremity edema EXAM: PORTABLE CHEST 1 VIEW COMPARISON:  09/18/2018 chest radiograph. FINDINGS: Stable cardiomediastinal silhouette with moderate cardiomegaly. No  pneumothorax. No pleural effusion. Mild pulmonary edema. IMPRESSION: Mild congestive heart failure. Electronically Signed   By: Delbert PhenixJason A Poff M.D.   On: 10/02/2018 11:30        Scheduled Meds: . atorvastatin  40 mg Oral Daily  . budesonide (PULMICORT) nebulizer solution  0.5 mg Nebulization BID  . carvedilol  25 mg Oral BID WC  . docusate sodium  100 mg Oral BID  . ferrous gluconate  324 mg Oral BID WC  . furosemide  80 mg Intravenous Q12H  . lisinopril  5 mg Oral Daily  . pantoprazole  40 mg Oral Q0600  . potassium chloride SA  40 mEq Oral TID  . rivaroxaban  20 mg Oral Q supper  . sodium chloride flush  3 mL Intravenous Q12H  . spironolactone  100 mg Oral Daily  . tamsulosin  0.4 mg Oral Daily   Continuous Infusions: . sodium chloride       LOS: 1 day    Time spent: 30 minutes    Mykel Sponaugle Hoover Brunette Janisha Bueso, DO [facility] Pager [phone]  If 7PM-7AM, please contact night-coverage [email or web address] Password [email or web address] 10/03/2018, 8:16 AM

## 2018-10-04 ENCOUNTER — Other Ambulatory Visit: Payer: Self-pay | Admitting: Pharmacist

## 2018-10-04 ENCOUNTER — Ambulatory Visit: Payer: Self-pay | Admitting: Pharmacist

## 2018-10-04 LAB — BASIC METABOLIC PANEL
Anion gap: 9 (ref 5–15)
BUN: 18 mg/dL (ref 6–20)
CO2: 26 mmol/L (ref 22–32)
Calcium: 8.4 mg/dL — ABNORMAL LOW (ref 8.9–10.3)
Chloride: 103 mmol/L (ref 98–111)
Creatinine, Ser: 1.18 mg/dL (ref 0.61–1.24)
GFR calc Af Amer: >60 mL/min (ref >60)
GFR calc non Af Amer: >60 mL/min (ref >60)
Glucose, Bld: 87 mg/dL (ref 70–99)
Potassium: 3.7 mmol/L (ref 3.5–5.1)
Sodium: 138 mmol/L (ref 135–145)

## 2018-10-04 LAB — MAGNESIUM: Magnesium: 2.1 mg/dL (ref 1.7–2.4)

## 2018-10-04 NOTE — Progress Notes (Signed)
PROGRESS NOTE    [name]  ZDG:[id number] DOB: 1966/03/27 DOA: 10/02/2018 PCP: Pearson Grippe, MD   Brief Narrative:  Per HPI: [name] a 53 y.o.malewith medical history significant forchronic combined systolic and diastolic CHF with last echo on 03/2018 with LVEF 55%, atrial fibrillation on Xarelto, hypertension, morbid obesity, GERD, and dyslipidemia who presents back to the ED after recent discharge on 3/31 for hospitalization of heart failure exacerbation at that time. His discharge weight at that time was 359 pounds and he is noted to be 392 pounds today. He was noted to have a significant history of medical noncompliance and also dietary noncompliance. He states that his Bumex does not work for him and he was increased to 3 times daily dosing on 4/6 on account of worsening edema and weight gain and states that he was taking his medications 3 times a day, but did not have much in the way of results. He states in the last 2 days he has had worsening dyspnea and edema which prompted the visit. He denies any significant coughing, fevers, chills, nausea, vomiting, or other symptomatology.  Patient admitted for acute on chronic combined systolic and diastolic heart failure exacerbation secondary to medication and dietary noncompliance.  Assessment & Plan:   Principal Problem:   Acute on chronic combined systolic and diastolic CHF (congestive heart failure) (HCC) Active Problems:   OBESITY, MORBID   Essential hypertension   Atrial fibrillation, chronic  1. Acute on chronic combined systolic and diastolic heart failure exacerbation-recurrent secondary to medication and dietary noncompliance-improving.  Continue IV Lasix 80 mg twice daily along with spironolactone and continue to monitor I's and O's as well as daily weights.  Weight today down to 368 pounds with baseline noted to be 359.  8.7 L net negative so far.  Patient eager to go home soon and discussed that likely will be  able to discharge by tomorrow if he meets goal. 2. Elevated blood pressure in the setting of hypertension.    Blood pressures are now stable.  Continue current medications as ordered. 3. Dyslipidemia. Continue statin. 4. Atrial fibrillation. Continue on Xarelto for anticoagulation and monitor on telemetry. 5. GERD. PPI. 6. CKD stage II. Monitor on repeat lab work. Currently stable.   DVT prophylaxis: Xarelto Code Status: Full Family Communication: None at bedside Disposition Plan: Continue IV diuresis with good response noted. Possible DC in next 24 hours. Current weight 368lbs and baseline weight on last DC was 359lbs.   Consultants:   None  Procedures:   None  Antimicrobials:   None  Subjective: Patient seen and evaluated today with no new acute complaints or concerns. No acute concerns or events noted overnight.  He continues to have lower extremity edema that is slowly improving, but he is not back to baseline and continues to diurese quite well.  Approximately 3.7 L of net negative fluid balance over the last 24 hours.  Objective: Vitals:   10/03/18 1936 10/03/18 2153 10/04/18 0507 10/04/18 0743  BP:  128/84 (!) 141/93   Pulse: 64 70 78   Resp: 18 20 20    Temp:  98 F (36.7 C) 98.1 F (36.7 C)   TempSrc:  Oral Oral   SpO2: 98% 100% 97% 95%  Weight:   (!) 167 kg   Height:        Intake/Output Summary (Last 24 hours) at 10/04/2018 0834 Last data filed at 10/04/2018 0507 Gross per 24 hour  Intake 960 ml  Output 4700 ml  Net -3740 ml   Filed Weights   10/02/18 1345 10/03/18 0426 10/04/18 0507  Weight: (!) 190.3 kg (!) 170 kg (!) 167 kg    Examination:  General exam: Appears calm and comfortable  Respiratory system: Clear to auscultation. Respiratory effort normal. Cardiovascular system: S1 & S2 heard, RRR. No JVD, murmurs, rubs, gallops or clicks.  1+ pedal edema noted bilaterally to mid shin. Gastrointestinal system: Abdomen is nondistended,  soft and nontender. No organomegaly or masses felt. Normal bowel sounds heard. Central nervous system: Alert and oriented. No focal neurological deficits. Extremities: Symmetric 5 x 5 power. Skin: No rashes, lesions or ulcers Psychiatry: Judgement and insight appear normal. Mood & affect appropriate.     Data Reviewed: I have personally reviewed following labs and imaging studies  CBC: Recent Labs  Lab 10/02/18 1034 10/03/18 0511  WBC 4.2 4.3  HGB 12.3* 12.5*  HCT 39.0 40.2  MCV 88.8 89.1  PLT 147* 178   Basic Metabolic Panel: Recent Labs  Lab 10/02/18 1034 10/03/18 0511 10/04/18 0555  NA 141 140 138  K 3.5 3.4* 3.7  CL 108 105 103  CO2 25 27 26   GLUCOSE 110* 93 87  BUN 16 15 18   CREATININE 1.16 1.07 1.18  CALCIUM 8.5* 8.3* 8.4*  MG 2.0 2.0 2.1   GFR: Estimated Creatinine Clearance: 120.1 mL/min (by C-G formula based on SCr of 1.18 mg/dL). Liver Function Tests: No results for input(s): AST, ALT, ALKPHOS, BILITOT, PROT, ALBUMIN in the last 168 hours. No results for input(s): LIPASE, AMYLASE in the last 168 hours. No results for input(s): AMMONIA in the last 168 hours. Coagulation Profile: No results for input(s): INR, PROTIME in the last 168 hours. Cardiac Enzymes: No results for input(s): CKTOTAL, CKMB, CKMBINDEX, TROPONINI in the last 168 hours. BNP (last 3 results) No results for input(s): PROBNP in the last 8760 hours. HbA1C: No results for input(s): HGBA1C in the last 72 hours. CBG: No results for input(s): GLUCAP in the last 168 hours. Lipid Profile: No results for input(s): CHOL, HDL, LDLCALC, TRIG, CHOLHDL, LDLDIRECT in the last 72 hours. Thyroid Function Tests: No results for input(s): TSH, T4TOTAL, FREET4, T3FREE, THYROIDAB in the last 72 hours. Anemia Panel: No results for input(s): VITAMINB12, FOLATE, FERRITIN, TIBC, IRON, RETICCTPCT in the last 72 hours. Sepsis Labs: No results for input(s): PROCALCITON, LATICACIDVEN in the last 168 hours.  No  results found for this or any previous visit (from the past 240 hour(s)).       Radiology Studies: Dg Chest Portable 1 View  Result Date: 10/02/2018 CLINICAL DATA:  Dyspnea, lower extremity edema EXAM: PORTABLE CHEST 1 VIEW COMPARISON:  09/18/2018 chest radiograph. FINDINGS: Stable cardiomediastinal silhouette with moderate cardiomegaly. No pneumothorax. No pleural effusion. Mild pulmonary edema. IMPRESSION: Mild congestive heart failure. Electronically Signed   By: Delbert Phenix M.D.   On: 10/02/2018 11:30        Scheduled Meds: . atorvastatin  40 mg Oral Daily  . budesonide (PULMICORT) nebulizer solution  0.5 mg Nebulization BID  . carvedilol  25 mg Oral BID WC  . docusate sodium  100 mg Oral BID  . ferrous gluconate  324 mg Oral BID WC  . furosemide  80 mg Intravenous Q12H  . lisinopril  5 mg Oral Daily  . pantoprazole  40 mg Oral Q0600  . potassium chloride  40 mEq Oral BID  . rivaroxaban  20 mg Oral Q supper  . sodium chloride flush  3 mL Intravenous Q12H  .  spironolactone  100 mg Oral Daily  . tamsulosin  0.4 mg Oral Daily   Continuous Infusions: . sodium chloride       LOS: 2 days    Time spent: 30 minutes    Creed Kail Hoover Brunette, DO [facility] Pager [phone]  If 7PM-7AM, please contact night-coverage [email or web address] Password [facility] 10/04/2018, 8:34 AM

## 2018-10-04 NOTE — Care Management Utilization Note (Signed)
Left VM message for [name], [facility] nurse CM, updating her on patient's progress.

## 2018-10-04 NOTE — Patient Outreach (Signed)
[facility]) Care Management  10/04/2018  [name] August 20, 1965 [id number]   Patient was scheduled to call for medication review and follow up. Unfortunately, he is currently hospitalized for CHF exacerbation.  Per hospital progress notes, patient's weight was up to 392 pounds from 359 pounds at the 09/20/2018 discharge.  He was given IV Furosemide and spironolactone.  Weight today was 368 pounds with normal blood pressures.  Plan: Follow up with patient upon discharge.   [name], PharmD, BCACP [facility] Clinical Pharmacist [phone]

## 2018-10-05 ENCOUNTER — Ambulatory Visit: Payer: Self-pay

## 2018-10-05 DIAGNOSIS — E785 Hyperlipidemia, unspecified: Secondary | ICD-10-CM

## 2018-10-05 DIAGNOSIS — N182 Chronic kidney disease, stage 2 (mild): Secondary | ICD-10-CM

## 2018-10-05 DIAGNOSIS — I482 Chronic atrial fibrillation, unspecified: Secondary | ICD-10-CM

## 2018-10-05 DIAGNOSIS — I10 Essential (primary) hypertension: Secondary | ICD-10-CM

## 2018-10-05 DIAGNOSIS — K219 Gastro-esophageal reflux disease without esophagitis: Secondary | ICD-10-CM

## 2018-10-05 LAB — BASIC METABOLIC PANEL
Anion gap: 9 (ref 5–15)
BUN: 20 mg/dL (ref 6–20)
CO2: 27 mmol/L (ref 22–32)
Calcium: 8.8 mg/dL — ABNORMAL LOW (ref 8.9–10.3)
Chloride: 104 mmol/L (ref 98–111)
Creatinine, Ser: 1.23 mg/dL (ref 0.61–1.24)
GFR calc Af Amer: >60 mL/min (ref >60)
GFR calc non Af Amer: >60 mL/min (ref >60)
Glucose, Bld: 89 mg/dL (ref 70–99)
Potassium: 3.9 mmol/L (ref 3.5–5.1)
Sodium: 140 mmol/L (ref 135–145)

## 2018-10-05 LAB — MAGNESIUM: Magnesium: 2.2 mg/dL (ref 1.7–2.4)

## 2018-10-05 MED ORDER — FUROSEMIDE 10 MG/ML IJ SOLN
80.0000 mg | Freq: Three times a day (TID) | INTRAMUSCULAR | Status: DC
  Administered 2018-10-05 (×2): 80 mg via INTRAVENOUS
  Filled 2018-10-05 (×3): qty 8

## 2018-10-05 NOTE — Progress Notes (Addendum)
PROGRESS NOTE    [name]  OVF:[id number] DOB: 1965-07-07 DOA: 10/02/2018 PCP: Pearson Grippe, MD   Brief Narrative:  Per HPI: Samuel Bruins Archeris a 53 y.o.malewith medical history significant forchronic combined systolic and diastolic CHF with last echo on 03/2018 with LVEF 55%, atrial fibrillation on Xarelto, hypertension, morbid obesity, GERD, and dyslipidemia who presents back to the ED after recent discharge on 3/31 for hospitalization of heart failure exacerbation at that time. His discharge weight at that time was 359 pounds and he is noted to be 392 pounds today. He was noted to have a significant history of medical noncompliance and also dietary noncompliance. He states that his Bumex does not work for him and he was increased to 3 times daily dosing on 4/6 on account of worsening edema and weight gain and states that he was taking his medications 3 times a day, but did not have much in the way of results. He states in the last 2 days he has had worsening dyspnea and edema which prompted the visit. He denies any significant coughing, fevers, chills, nausea, vomiting, or other symptomatology.  Patient admitted for acute on chronic combined systolic and diastolic heart failure exacerbation secondary to medication and dietary noncompliance.  Assessment & Plan:   Principal Problem:   Acute on chronic combined systolic and diastolic CHF (congestive heart failure) (HCC) Active Problems:   OBESITY, MORBID   Essential hypertension   Atrial fibrillation, chronic  1. Acute on chronic combined systolic and diastolic heart failure exacerbation-recurrent secondary to medication and dietary noncompliance-improving.  Continue IV Lasix 80 mg, but change to TID.  Continue to follow daily weights, strict intake and output.  Close monitoring of renal function and electrolytes.  Education about low-sodium and medication compliance importance provided. Ultimate plan (once baseline weight achieved)  is to transition to oral Lasix and reevaluate urine output prior to discharge. 2. Elevated blood pressure in the setting of hypertension.    Blood pressures are now stable.  Continue current medications as ordered.  Patient educated about low-sodium diet. 3. Dyslipidemia. Continue statin. 4. Atrial fibrillation. Continue on Xarelto for anticoagulation and monitor on telemetry.  Rate controlled. 5. GERD. Continue PPI. 6. CKD stage II. Monitor on repeat lab work. Currently stable and at baseline.. 7. Morbid obesity: Body mass index is 49.2 kg/m. low calorie diet, portion control and increase physical activity discussed with patient.    DVT prophylaxis: Xarelto Code Status: Full Family Communication: None at bedside Disposition Plan: Continue IV diuresis with good response noted. Possible DC in next 24-48 hours. Current weight 363 lbs and baseline weight on last DC was 359lbs.   Consultants:   None  Procedures:   None  Antimicrobials:   None  Subjective: Afebrile, no chest pain, no nausea, no vomiting.  Continue reporting good urine output.  Express swelling on his lower extremity and some orthopnea.  Objective: Vitals:   10/04/18 2123 10/05/18 0504 10/05/18 0748 10/05/18 1452  BP: (!) 117/92 (!) 134/93  (!) 115/94  Pulse: 68 71  60  Resp: (!) 24 20    Temp: 97.9 F (36.6 C) 98.5 F (36.9 C)  97.7 F (36.5 C)  TempSrc: Oral Oral  Oral  SpO2: 98% 97% 97% 98%  Weight:  (!) 164.6 kg    Height:        Intake/Output Summary (Last 24 hours) at 10/05/2018 1640 Last data filed at 10/05/2018 0900 Gross per 24 hour  Intake 1320 ml  Output 1700 ml  Net -380 ml   Filed Weights   10/03/18 0426 10/04/18 0507 10/05/18 0504  Weight: (!) 170 kg (!) 167 kg (!) 164.6 kg    Examination: General exam: Alert, awake, oriented x 3; reports no chest pain, breathing easier, afebrile.  Still with some swelling on his legs and reporting orthopnea. Respiratory system: Fine  crackles at the bases bilaterally, no wheezing, no using accessory muscles.   Cardiovascular system:Rate controlled. No murmurs, rubs, gallops. Gastrointestinal system: Abdomen is nondistended, soft and nontender. No organomegaly or masses felt. Normal bowel sounds heard. Central nervous system: Alert and oriented. No focal neurological deficits. Extremities/skin: 2+ edema bilaterally, no cyanosis; chronic stasis dermatitis changes appreciated bilaterally. Psychiatry: Judgement and insight appear normal. Mood & affect appropriate.      Data Reviewed: I have personally reviewed following labs and imaging studies  CBC: Recent Labs  Lab 10/02/18 1034 10/03/18 0511  WBC 4.2 4.3  HGB 12.3* 12.5*  HCT 39.0 40.2  MCV 88.8 89.1  PLT 147* 178   Basic Metabolic Panel: Recent Labs  Lab 10/02/18 1034 10/03/18 0511 10/04/18 0555 10/05/18 0521  NA 141 140 138 140  K 3.5 3.4* 3.7 3.9  CL 108 105 103 104  CO2 25 27 26 27   GLUCOSE 110* 93 87 89  BUN 16 15 18 20   CREATININE 1.16 1.07 1.18 1.23  CALCIUM 8.5* 8.3* 8.4* 8.8*  MG 2.0 2.0 2.1 2.2   GFR: Estimated Creatinine Clearance: 114.2 mL/min (by C-G formula based on SCr of 1.23 mg/dL).  Scheduled Meds:  atorvastatin  40 mg Oral Daily   budesonide (PULMICORT) nebulizer solution  0.5 mg Nebulization BID   carvedilol  25 mg Oral BID WC   docusate sodium  100 mg Oral BID   ferrous gluconate  324 mg Oral BID WC   furosemide  80 mg Intravenous Q12H   lisinopril  5 mg Oral Daily   pantoprazole  40 mg Oral Q0600   potassium chloride  40 mEq Oral BID   rivaroxaban  20 mg Oral Q supper   sodium chloride flush  3 mL Intravenous Q12H   spironolactone  100 mg Oral Daily   tamsulosin  0.4 mg Oral Daily   Continuous Infusions:  sodium chloride       LOS: 3 days    Time spent: 30 minutes    Vassie Loll, MD [facility] Pager [phone]  10/05/2018, 4:39 PM

## 2018-10-06 LAB — CBC
HCT: 48.5 % (ref 39.0–52.0)
Hemoglobin: 15.4 g/dL (ref 13.0–17.0)
MCH: 28.1 pg (ref 26.0–34.0)
MCHC: 31.8 g/dL (ref 30.0–36.0)
MCV: 88.3 fL (ref 80.0–100.0)
Platelets: 252 10*3/uL (ref 150–400)
RBC: 5.49 MIL/uL (ref 4.22–5.81)
RDW: 14.1 % (ref 11.5–15.5)
WBC: 6.2 10*3/uL (ref 4.0–10.5)
nRBC: 0 % (ref 0.0–0.2)

## 2018-10-06 LAB — GLUCOSE, CAPILLARY: Glucose-Capillary: 73 mg/dL (ref 70–99)

## 2018-10-06 LAB — BASIC METABOLIC PANEL
Anion gap: 11 (ref 5–15)
BUN: 23 mg/dL — ABNORMAL HIGH (ref 6–20)
CO2: 27 mmol/L (ref 22–32)
Calcium: 9.3 mg/dL (ref 8.9–10.3)
Chloride: 102 mmol/L (ref 98–111)
Creatinine, Ser: 1.46 mg/dL — ABNORMAL HIGH (ref 0.61–1.24)
GFR calc Af Amer: >60 mL/min (ref >60)
GFR calc non Af Amer: 55 mL/min — ABNORMAL LOW (ref >60)
Glucose, Bld: 89 mg/dL (ref 70–99)
Potassium: 4 mmol/L (ref 3.5–5.1)
Sodium: 140 mmol/L (ref 135–145)

## 2018-10-06 MED ORDER — DEXTROSE 50 % IV SOLN
INTRAVENOUS | Status: AC
  Filled 2018-10-06: qty 50

## 2018-10-06 MED ORDER — FUROSEMIDE 80 MG PO TABS
120.0000 mg | ORAL_TABLET | Freq: Two times a day (BID) | ORAL | Status: DC
  Administered 2018-10-06 – 2018-10-07 (×3): 120 mg via ORAL
  Filled 2018-10-06 (×3): qty 1

## 2018-10-06 NOTE — Progress Notes (Signed)
PROGRESS NOTE    [name]  YBO:[id number] DOB: [birth year] DOA: 10/02/2018 PCP: Pearson Grippe, MD   Brief Narrative:  Per HPI: [name] a 53 y.o.malewith medical history significant forchronic combined systolic and diastolic CHF with last echo on 03/2018 with LVEF 55%, atrial fibrillation on Xarelto, hypertension, morbid obesity, GERD, and dyslipidemia who presents back to the ED after recent discharge on 3/31 for hospitalization of heart failure exacerbation at that time. His discharge weight at that time was 359 pounds and he is noted to be 392 pounds today. He was noted to have a significant history of medical noncompliance and also dietary noncompliance. He states that his Bumex does not work for him and he was increased to 3 times daily dosing on 4/6 on account of worsening edema and weight gain and states that he was taking his medications 3 times a day, but did not have much in the way of results. He states in the last 2 days he has had worsening dyspnea and edema which prompted the visit. He denies any significant coughing, fevers, chills, nausea, vomiting, or other symptomatology.  Patient admitted for acute on chronic combined systolic and diastolic heart failure exacerbation secondary to medication and dietary noncompliance.  Assessment & Plan:   Principal Problem:   Acute on chronic combined systolic and diastolic CHF (congestive heart failure) (HCC) Active Problems:   OBESITY, MORBID   Essential hypertension   Atrial fibrillation, chronic  1. Acute on chronic combined systolic and diastolic heart failure exacerbation-recurrent secondary to medication and dietary noncompliance-improving.    And station Lasix to follow for out using 120 mg twice a day; patient current weight 358.6 pounds; which is essentially at his baseline.  Creatinine has also bumped up a little during acute diuresis suggesting intravascular euvolemic state.  Continue to follow daily weights,  strict I's and O's and urine output response to oral regimen. 2. Elevated blood pressure in the setting of hypertension.  Blood pressures are now stable.  Continue current medications as ordered.  Patient educated about low-sodium diet. 3. Dyslipidemia. Continue statin. 4. Atrial fibrillation. Continue on Xarelto for anticoagulation and monitor on telemetry.  Rate controlled. 5. GERD. Continue PPI. 6. CKD stage II. Monitor on repeat lab work.  Slight increase in his creatinine with aggressive diuresis.  Repeat basic metabolic panel in a.m. to follow renal function trend. 7. Morbid obesity: Body mass index is 48.98 kg/m. low calorie diet, portion control and increase physical activity discussed with patient.    DVT prophylaxis: Xarelto Code Status: Full Family Communication: None at bedside Disposition Plan: Continue diuresis; now transition to oral regimen. Possible DC in next 24 hours. Current weight 358.6 lbs and baseline weight on last DC was 359lbs.   Consultants:   None  Procedures:   None  Antimicrobials:   None  Subjective: Afebrile, no chest pain, no nausea, no vomiting, no orthopnea.  Patient with good oxygen saturation on room air.  Overnight experienced transient episode of lightheadedness and decreased loss of consciousness while attempting to use the bathroom.  He reported significant urine output overnight.  Objective: Vitals:   10/06/18 0517 10/06/18 0800 10/06/18 1059 10/06/18 1433  BP: 128/90  108/80 131/82  Pulse: (!) 114  80 75  Resp: (!) 22  20   Temp: 99.3 F (37.4 C)  98.5 F (36.9 C) 98.2 F (36.8 C)  TempSrc: Oral  Oral Oral  SpO2: 100% 91% 98% 95%  Weight:      Height:  Intake/Output Summary (Last 24 hours) at 10/06/2018 1451 Last data filed at 10/06/2018 0636 Gross per 24 hour  Intake 720 ml  Output 3000 ml  Net -2280 ml   Filed Weights   10/04/18 0507 10/05/18 0504 10/06/18 0415  Weight: (!) 167 kg (!) 164.6 kg (!)  163.8 kg    Examination: General exam: Alert, awake, oriented x 3; afebrile, no chest pain, no orthopnea, good oxygen saturation on room air. Respiratory system: No frank crackles, no wheezing, normal respiratory effort.   Cardiovascular system:RRR. No murmurs, rubs, gallops. Gastrointestinal system: Abdomen is obese, nondistended, soft and nontender. No organomegaly or masses felt. Normal bowel sounds heard. Central nervous system: Alert and oriented. No focal neurological deficits. Extremities/skin: 1+ edema bilaterally, no cyanosis, no clubbing; chronic stasis dermatitis changes appreciated on exam.   Psychiatry: Judgement and insight appear normal. Mood & affect appropriate.    Data Reviewed: I have personally reviewed following labs and imaging studies  CBC: Recent Labs  Lab 10/02/18 1034 10/03/18 0511 10/06/18 0149  WBC 4.2 4.3 6.2  HGB 12.3* 12.5* 15.4  HCT 39.0 40.2 48.5  MCV 88.8 89.1 88.3  PLT 147* 178 252   Basic Metabolic Panel: Recent Labs  Lab 10/02/18 1034 10/03/18 0511 10/04/18 0555 10/05/18 0521 10/06/18 0149  NA 141 140 138 140 140  K 3.5 3.4* 3.7 3.9 4.0  CL 108 105 103 104 102  CO2 25 27 26 27 27   GLUCOSE 110* 93 87 89 89  BUN 16 15 18 20  23*  CREATININE 1.16 1.07 1.18 1.23 1.46*  CALCIUM 8.5* 8.3* 8.4* 8.8* 9.3  MG 2.0 2.0 2.1 2.2  --    GFR: Estimated Creatinine Clearance: 96 mL/min (A) (by C-G formula based on SCr of 1.46 mg/dL (H)).  Scheduled Meds: . atorvastatin  40 mg Oral Daily  . budesonide (PULMICORT) nebulizer solution  0.5 mg Nebulization BID  . carvedilol  25 mg Oral BID WC  . docusate sodium  100 mg Oral BID  . ferrous gluconate  324 mg Oral BID WC  . furosemide  120 mg Oral BID  . lisinopril  5 mg Oral Daily  . pantoprazole  40 mg Oral Q0600  . potassium chloride  40 mEq Oral BID  . rivaroxaban  20 mg Oral Q supper  . sodium chloride flush  3 mL Intravenous Q12H  . spironolactone  100 mg Oral Daily  . tamsulosin  0.4 mg  Oral Daily   Continuous Infusions: . sodium chloride       LOS: 4 days    Time spent: 30 minutes   Vassie Loll, MD [facility] Pager [phone]  10/06/2018, 2:51 PM

## 2018-10-06 NOTE — Progress Notes (Signed)
Lightheaded when standing. Orthostatic B/P Lying 158/132, sitting 107/59, and standing 73/59. Bed alarm on. Verbalized understanding not to get OOB without assistance.

## 2018-10-06 NOTE — Plan of Care (Signed)
Episode of lightheadedness and decrease LOC. Stable and LOC returned to baseline. To call for assistance. Bed alarm on.

## 2018-10-06 NOTE — Progress Notes (Signed)
Pt up to the BR. Became lighted headed. Assisted back to bed. VSS. See flowsheet. LOC decreased. Rapid Response call made. Dr. Antionette Char at bedside. LOC returning to baseline; remained Ox3.. States he feels "weak and tired". Orders obtained. VSS. See flowsheet. Continue to monitor. Pt verbalized understanding of  geting up with assistance only. Bed alarm on.

## 2018-10-07 ENCOUNTER — Ambulatory Visit: Payer: Self-pay

## 2018-10-07 DIAGNOSIS — J449 Chronic obstructive pulmonary disease, unspecified: Secondary | ICD-10-CM

## 2018-10-07 DIAGNOSIS — N400 Enlarged prostate without lower urinary tract symptoms: Secondary | ICD-10-CM

## 2018-10-07 DIAGNOSIS — J9611 Chronic respiratory failure with hypoxia: Secondary | ICD-10-CM

## 2018-10-07 LAB — BASIC METABOLIC PANEL
Anion gap: 8 (ref 5–15)
BUN: 30 mg/dL — ABNORMAL HIGH (ref 6–20)
CO2: 23 mmol/L (ref 22–32)
Calcium: 8.9 mg/dL (ref 8.9–10.3)
Chloride: 106 mmol/L (ref 98–111)
Creatinine, Ser: 1.42 mg/dL — ABNORMAL HIGH (ref 0.61–1.24)
GFR calc Af Amer: >60 mL/min (ref >60)
GFR calc non Af Amer: 57 mL/min — ABNORMAL LOW (ref >60)
Glucose, Bld: 107 mg/dL — ABNORMAL HIGH (ref 70–99)
Potassium: 3.8 mmol/L (ref 3.5–5.1)
Sodium: 137 mmol/L (ref 135–145)

## 2018-10-07 MED ORDER — FUROSEMIDE 80 MG PO TABS: 120.0000 mg | ORAL_TABLET | Freq: Two times a day (BID) | ORAL | 2 refills | Status: DC

## 2018-10-07 MED ORDER — POTASSIUM CHLORIDE CRYS ER 20 MEQ PO TBCR: 40.0000 meq | EXTENDED_RELEASE_TABLET | Freq: Two times a day (BID) | ORAL | 2 refills | Status: DC

## 2018-10-07 NOTE — Discharge Summary (Signed)
Physician Discharge Summary  [name] MVH:[id number] DOB: [birth year] DOA: 10/02/2018  PCP: Pearson Grippe, MD  Admit date: 10/02/2018 Discharge date: 10/07/2018  Time spent: 35 minutes  Recommendations for Outpatient Follow-up:  1. Repeat basic metabolic panel to follow electrolytes and renal function 2. Close follow-up to patient's volume status with further adjustment to his diuretic regimen 3. Follow-up with cardiology service to further assist with the control of his underlying heart failure condition.   Discharge Diagnoses:  Principal Problem:   Acute on chronic combined systolic and diastolic CHF (congestive heart failure) (HCC) Active Problems:   OBESITY, MORBID   Essential hypertension   Atrial fibrillation, chronic   Chronic obstructive pulmonary disease (HCC)   Benign prostatic hyperplasia without lower urinary tract symptoms Obesity hypoventilation syndrome Chronic respiratory failure GERD  Discharge Condition: Stable and improved.  Patient discharged home with instruction to follow-up with PCP and cardiology service as an outpatient.  Diet recommendation: Heart healthy and low calorie diet.  Filed Weights   10/05/18 0504 10/06/18 0415 10/07/18 0500  Weight: (!) 164.6 kg (!) 163.8 kg (!) 162.8 kg    Brief history of present illness:  Samuel T Archeris a 52 y.o.malewith medical history significant forchronic combined systolic and diastolic CHF with last echo on 03/2018 with LVEF 55%, atrial fibrillation on Xarelto, hypertension, morbid obesity, GERD, and dyslipidemia who presents back to the ED after recent discharge on 3/31 for hospitalization of heart failure exacerbation at that time. His discharge weight at that time was 359 pounds and he is noted to be 392 pounds today. He was noted to have a significant history of medical noncompliance and also dietary noncompliance. He states that his Bumex does not work for him and he was increased to 3 times daily dosing on  4/6 on account of worsening edema and weight gain and states that he was taking his medications 3 times a day, but did not have much in the way of results. He states in the last 2 days he has had worsening dyspnea and edema which prompted the visit. He denies any significant coughing, fevers, chills, nausea, vomiting, or other symptomatology.  Patient admitted for acute on chronic combined systolic and diastolic heart failure exacerbation secondary to medication and dietary noncompliance.  Hospital Course:  1. Acute on chronic combined systolic and diastolic heart failure exacerbation-recurrent secondary to medication and dietary noncompliance-improved and stable for discharge.  Patient will be discharged on Lasix 120 mg twice a day (days has been tested inside the hospital with good maintenance of urine output and good tolerability). Patient current weight 356 pounds; which is essentially at his baseline. Creatinine aso bumped up a little during acute diuresis suggesting intravascular euvolemic state.  Continue to follow daily weights and low sodium diet.  Patient will continue using beta-blocker and also spironolactone.  Outpatient follow-up with cardiology already arranged. 2. Elevated blood pressure in the setting of hypertension.Blood pressures are stable.  Continue current medications as ordered.  Patient educated about low-sodium diet. 3. Dyslipidemia. Continue statin. 4. Chronic atrial fibrillation. Continue on Xarelto for anticoagulation. Rate controlled with the use of carvedilol. 5. GERD. Continue PPI. 6. CKD stage II. Will recommend repeat basic metabolic panel at follow-up visit to reassess electrolytes and renal function.  Creatinine at discharge 1.4.   7. Morbid obesity: Body mass index is 48.68 kg/m. Low calorie diet, portion control and increase physical activity discussed with patient.  8. BPH: No complaints of urinary retention.  Continue Flomax. 9. Chronic respiratory  failure/COPD: Appears stable and in no acute exacerbation.  Continue as needed albuterol and the use of Flovent.  Continue PRN usage of oxygen supplementation.  There is definitely a component of obesity hypoventilation syndrome in his intermittent need for oxygen.  Procedures:  See below for x-ray reports.  Consultations:  None  Discharge Exam: Vitals:   10/07/18 0520 10/07/18 0759  BP: 117/86   Pulse: (!) 59   Resp: 20   Temp: 98.2 F (36.8 C)   SpO2: 96% 92%   General exam: Alert, awake, oriented x 3; afebrile, no chest pain, no orthopnea, good oxygen saturation on room air.  Patient denies any side effects or further episodes of lightheadedness overnight.  Continue to demonstrate good urine output. Respiratory system: No frank crackles, no wheezing, normal respiratory effort.   Cardiovascular system:RRR. No murmurs, rubs, gallops. Gastrointestinal system: Abdomen is obese, nondistended, soft and nontender. No organomegaly or masses felt. Normal bowel sounds heard. Central nervous system: Alert and oriented. No focal neurological deficits. Extremities/skin: 1+ edema bilaterally, no cyanosis, no clubbing; chronic stasis dermatitis changes appreciated on exam.   Psychiatry: Judgement and insight appear normal. Mood & affect appropriate.   Discharge Instructions   Discharge Instructions    (HEART FAILURE PATIENTS) Call MD:  Anytime you have any of the following symptoms: 1) 3 pound weight gain in 24 hours or 5 pounds in 1 week 2) shortness of breath, with or without a dry hacking cough 3) swelling in the hands, feet or stomach 4) if you have to sleep on extra pillows at night in order to breathe.   Complete by:  As directed    Diet - low sodium heart healthy   Complete by:  As directed    Discharge instructions   Complete by:  As directed    Be compliant and take medications as prescribed Follow low-sodium diet (less than 2.5 g of sodium daily) Check your weight on daily  basis Outpatient follow-up in 1 week with PCP Follow-up with cardiology service as previously instructed     Allergies as of 10/07/2018      Reactions   Banana Shortness Of Breath   Bee Venom Shortness Of Breath, Swelling, Other (See Comments)   Reaction:  Facial swelling   Food Shortness Of Breath, Rash, Other (See Comments)   Pt states that he is allergic to strawberries.     Aspirin Other (See Comments)   Reaction:  GI upset    Metolazone Other (See Comments)   Pt states that he stopped taking this med due to heart attack like symptoms.    Oatmeal Nausea And Vomiting   Orange Juice [orange Oil] Nausea And Vomiting   All acidic products make him nauseous and upset stomach   Torsemide Swelling, Other (See Comments)   Reaction:  Swelling of feet/legs    Diltiazem Palpitations   Hydralazine Palpitations   Lipitor [atorvastatin] Other (See Comments)   Reaction:  Nose bleeds       Medication List    STOP taking these medications   bumetanide 2 MG tablet Commonly known as:  BUMEX     TAKE these medications   albuterol (2.5 MG/3ML) 0.083% nebulizer solution Commonly known as:  PROVENTIL Take 2.5 mg by nebulization every 6 (six) hours as needed for wheezing or shortness of breath.   atorvastatin 40 MG tablet Commonly known as:  LIPITOR Take 40 mg by mouth daily.   carvedilol 25 MG tablet Commonly known as:  COREG Take 1 tablet (  25 mg total) by mouth 2 (two) times daily with a meal for 30 days. What changed:  when to take this   docusate sodium 100 MG capsule Commonly known as:  COLACE Take 1 capsule (100 mg total) by mouth 2 (two) times daily.   ferrous gluconate 324 MG tablet Commonly known as:  FERGON Take 1 tablet (324 mg total) by mouth 2 (two) times daily with a meal.   fluticasone 220 MCG/ACT inhaler Commonly known as:  FLOVENT HFA Inhale 2 puffs into the lungs daily.   furosemide 80 MG tablet Commonly known as:  LASIX Take 1.5 tablets (120 mg total) by  mouth 2 (two) times daily.   lisinopril 5 MG tablet Commonly known as:  ZESTRIL Take 1 tablet (5 mg total) by mouth daily.   OXYGEN Inhale 3.5 L into the lungs daily.   pantoprazole 40 MG tablet Commonly known as:  PROTONIX Take 1 tablet (40 mg total) by mouth daily at 6 (six) AM. What changed:  when to take this   potassium chloride SA 20 MEQ tablet Commonly known as:  K-DUR Take 2 tablets (40 mEq total) by mouth 2 (two) times daily. What changed:  how much to take   rivaroxaban 20 MG Tabs tablet Commonly known as:  XARELTO Take 1 tablet (20 mg total) by mouth daily with breakfast. What changed:  when to take this   spironolactone 100 MG tablet Commonly known as:  ALDACTONE Take 100 mg by mouth daily. For fluid   tamsulosin 0.4 MG Caps capsule Commonly known as:  FLOMAX Take 1 capsule (0.4 mg total) by mouth daily.      Allergies  Allergen Reactions  . Banana Shortness Of Breath  . Bee Venom Shortness Of Breath, Swelling and Other (See Comments)    Reaction:  Facial swelling  . Food Shortness Of Breath, Rash and Other (See Comments)    Pt states that he is allergic to strawberries.    . Aspirin Other (See Comments)    Reaction:  GI upset   . Metolazone Other (See Comments)    Pt states that he stopped taking this med due to heart attack like symptoms.   . Oatmeal Nausea And Vomiting  . Orange Juice [Orange Oil] Nausea And Vomiting    All acidic products make him nauseous and upset stomach  . Torsemide Swelling and Other (See Comments)    Reaction:  Swelling of feet/legs   . Diltiazem Palpitations  . Hydralazine Palpitations  . Lipitor [Atorvastatin] Other (See Comments)    Reaction:  Nose bleeds    Follow-up Information    Pearson Grippe, MD. Schedule an appointment as soon as possible for a visit in 10 day(s).   Specialty:  Internal Medicine Contact information: [address] [place] [address] [phone]        [name], MD .   Specialty:  Cardiology Contact information: [address] [place] [address] [phone]           The results of significant diagnostics from this hospitalization (including imaging, microbiology, ancillary and laboratory) are listed below for reference.    Significant Diagnostic Studies: Dg Chest Portable 1 View  Result Date: 10/02/2018 CLINICAL DATA:  Dyspnea, lower extremity edema EXAM: PORTABLE CHEST 1 VIEW COMPARISON:  09/18/2018 chest radiograph. FINDINGS: Stable cardiomediastinal silhouette with moderate cardiomegaly. No pneumothorax. No pleural effusion. Mild pulmonary edema. IMPRESSION: Mild congestive heart failure. Electronically Signed   By: Delbert Phenix  M.D.   On: 10/02/2018 11:30   Dg Chest Port 1 View  Result Date: 09/18/2018 CLINICAL DATA:  Shortness of breath EXAM: PORTABLE CHEST 1 VIEW COMPARISON:  08/31/2018 FINDINGS: Underpenetrated AP portable examination. Gross cardiomegaly similar prior examination. Pulmonary vascular prominence without overt edema or obvious pleural effusions. No focal airspace opacity. IMPRESSION: Underpenetrated AP portable examination. Gross cardiomegaly similar prior examination. Pulmonary vascular prominence without overt edema or obvious pleural effusions. No focal airspace opacity. Electronically Signed   By: Lauralyn Primes M.D.   On: 09/18/2018 02:43   Labs: Basic Metabolic Panel: Recent Labs  Lab 10/02/18 1034 10/03/18 0511 10/04/18 0555 10/05/18 0521 10/06/18 0149 10/07/18 0518  NA 141 140 138 140 140 137  K 3.5 3.4* 3.7 3.9 4.0 3.8  CL 108 105 103 104 102 106  CO2 25 27 26 27 27 23   GLUCOSE 110* 93 87 89 89 107*  BUN 16 15 18 20  23* 30*  CREATININE 1.16 1.07 1.18 1.23 1.46* 1.42*  CALCIUM 8.5* 8.3* 8.4* 8.8* 9.3 8.9  MG 2.0 2.0 2.1 2.2  --   --    CBC: Recent Labs  Lab 10/02/18 1034 10/03/18 0511 10/06/18 0149  WBC 4.2 4.3 6.2  HGB 12.3* 12.5* 15.4  HCT 39.0 40.2 48.5  MCV 88.8 89.1 88.3  PLT 147* 178  252    BNP (last 3 results) Recent Labs    08/31/18 0134 09/18/18 0222 10/02/18 1034  BNP 341.0* 256.0* 328.0*   CBG: Recent Labs  Lab 10/06/18 0137  GLUCAP 73    Signed:  Vassie Loll MD.  [facility] 10/07/2018, 12:37 PM

## 2018-10-07 NOTE — Care Management Important Message (Signed)
Important Message  Patient Details  Name: [name] MRN: [id number] Date of Birth: 06-21-1966   Medicare Important Message Given:  Yes    Corey Harold 10/07/2018, 4:04 PM

## 2018-10-07 NOTE — Discharge Planning (Signed)
Discharge instructions and prescription reviewed with patient, he verbalized understanding, PIV removed, await wheelchair, CNA will transport patient to short day entrance,family will pick up via private vehicle.

## 2018-10-10 ENCOUNTER — Other Ambulatory Visit: Payer: Self-pay

## 2018-10-10 ENCOUNTER — Other Ambulatory Visit: Payer: Self-pay | Admitting: Pharmacist

## 2018-10-10 ENCOUNTER — Ambulatory Visit: Payer: Self-pay | Admitting: Pharmacist

## 2018-10-10 DIAGNOSIS — I5043 Acute on chronic combined systolic (congestive) and diastolic (congestive) heart failure: Secondary | ICD-10-CM | POA: Diagnosis not present

## 2018-10-10 DIAGNOSIS — J449 Chronic obstructive pulmonary disease, unspecified: Secondary | ICD-10-CM | POA: Diagnosis not present

## 2018-10-10 NOTE — Patient Outreach (Signed)
[facility]) Care Management  10/10/2018  [name] 09-Apr-1966 [id number]   Called patient. A gentleman answered the phone and said the patient could not come to the phone. He took a message and said the patient would call me back.  Patient has been extremely difficult to reach. Purpose of today's call was to complete a medication review post discharge.   Plan: Call patient back in 3-5 business days. Leave message for [facility] Nurse working with the patient.  [name], PharmD, BCACP [facility] Clinical Pharmacist [phone]

## 2018-10-10 NOTE — Patient Outreach (Signed)
[facility]) Care Management  10/10/2018  FREDIRICK BUTTON 1966/05/17 [id number]   Attempted outreach post hospital discharge. Phone rang multiple times without option to leave a voice message.   PLAN Will attempt outreach on 10/12/18.   Katha Cabal [facility] Community Care Manager [phone]

## 2018-10-11 DIAGNOSIS — I5023 Acute on chronic systolic (congestive) heart failure: Secondary | ICD-10-CM | POA: Diagnosis not present

## 2018-10-11 DIAGNOSIS — Z79899 Other long term (current) drug therapy: Secondary | ICD-10-CM | POA: Diagnosis not present

## 2018-10-11 DIAGNOSIS — I429 Cardiomyopathy, unspecified: Secondary | ICD-10-CM | POA: Diagnosis not present

## 2018-10-11 DIAGNOSIS — I10 Essential (primary) hypertension: Secondary | ICD-10-CM | POA: Diagnosis not present

## 2018-10-11 DIAGNOSIS — I5043 Acute on chronic combined systolic (congestive) and diastolic (congestive) heart failure: Secondary | ICD-10-CM | POA: Diagnosis not present

## 2018-10-12 ENCOUNTER — Other Ambulatory Visit: Payer: Self-pay

## 2018-10-12 ENCOUNTER — Ambulatory Visit: Payer: Medicare Other | Admitting: Gastroenterology

## 2018-10-12 NOTE — Patient Outreach (Signed)
[facility]) Care Management  10/12/2018  [name] October 02, 1965 [id number]   Attempted outreach with [name]. Per his son, he was not available at the time of call. Left message to return call when he returns.   PLAN Pending return call.   Katha Cabal [facility] Community Care Manager [phone]

## 2018-10-13 ENCOUNTER — Other Ambulatory Visit: Payer: Self-pay | Admitting: Pharmacist

## 2018-10-13 IMAGING — CR DG CHEST 1V PORT
1 series · 1 of 1 positions shown · non-contrast
Comparison: 01/28/2017 .

CLINICAL DATA: Shortness of breath.  Lower extremity edema.

EXAM:
PORTABLE CHEST 1 VIEW

[portable]
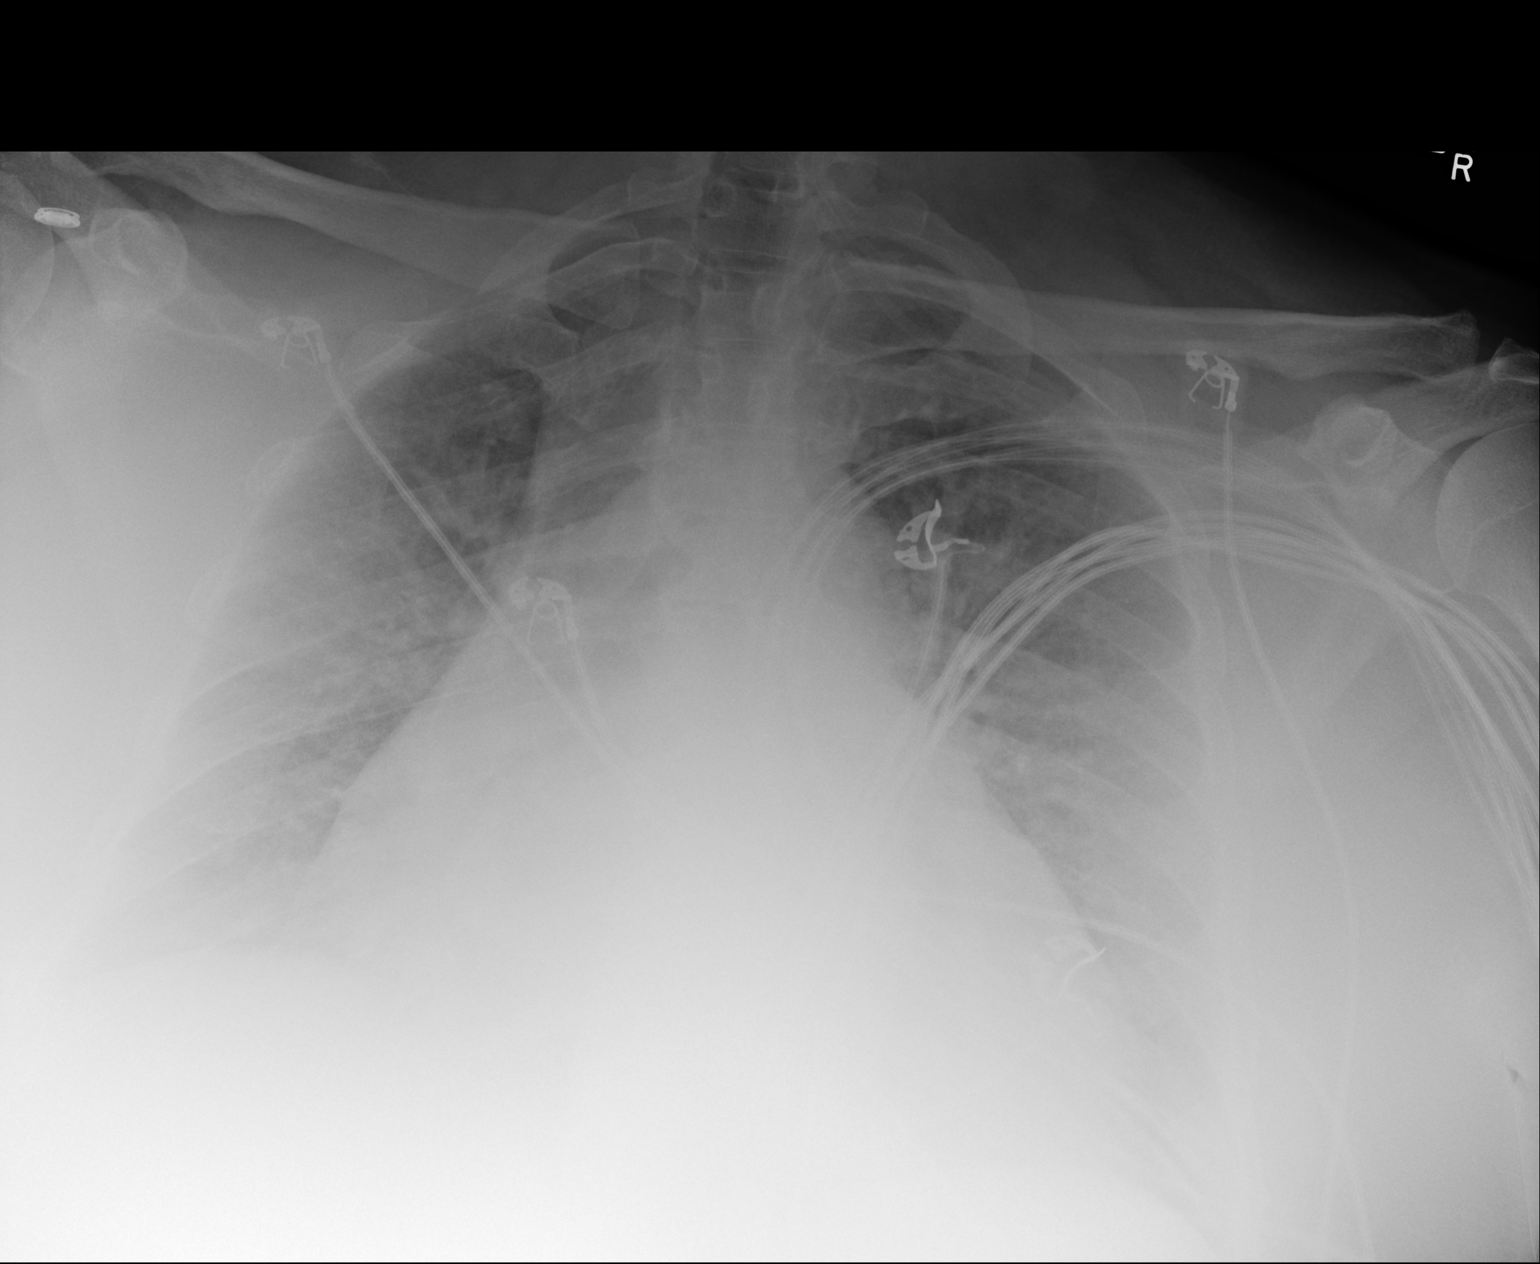

[1 of 1 positions shown; findings below may reference images not displayed]

FINDINGS: Cardiomegaly with diffuse bilateral pulmonary interstitial
prominence and bilateral pleural effusions consistent with
congestive heart failure.
IMPRESSION: Congestive heart failure with bilateral pulmonary interstitial edema
and bilateral pleural effusions.

## 2018-10-13 NOTE — Patient Outreach (Signed)
[facility]) Care Management  [facility]acy   10/13/2018  [name] [birth year] [id number]  Reason for referral: Medication Review, Medication Reconciliation Post Discharge  Referral source: patient was previously followed by [name], PharmD and she is no longer with [facility] Current insurance: [facility]  PMHx includes but not limited to:   Combined systolic and diastolic CHF, a fib, hypertension, morbid obesity , GERD, hyperlipidemia, and multiple hospitalizations for CHF exacerbation.  Patient was recently discharged for CHF exacerbation.  Outreach:  Successful telephone call with patient.  HIPAA identifiers verified.   Subjective:  Patient reports using family members as an adherence strategy.  Does the patient ever forget to take medication?  yes Does the patient have problems obtaining medications due to transportation?   no Does the patient have problems obtaining medications due to cost?  no  Does the patient feel that medications prescribed are effective?  No (He prefers IV Furosemide) Does the patient ever experience any side effects to the medications prescribed?  no  Does the patient measure his/her own blood glucose at home?  No Does the patient measure his/her own blood pressure at home? Yes   Objective: Weight:  375 pounds  Lab Results  Component Value Date   CREATININE 1.42 (H) 10/07/2018   CREATININE 1.46 (H) 10/06/2018   CREATININE 1.23 10/05/2018    Lab Results  Component Value Date   HGBA1C 5.7 (H) 04/04/2016    Lipid Panel     Component Value Date/Time   CHOL 139 06/14/2015 0328   TRIG 58 06/14/2015 0328   HDL 28 (L) 06/14/2015 0328   CHOLHDL 5.0 06/14/2015 0328   VLDL 12 06/14/2015 0328   LDLCALC 99 06/14/2015 0328    BP Readings from Last 3 Encounters:  10/07/18 117/86  09/26/18 (!) 145/95  09/20/18 109/80    Allergies  Allergen Reactions  . Banana Shortness Of Breath  . Bee Venom  Shortness Of Breath, Swelling and Other (See Comments)    Reaction:  Facial swelling  . Food Shortness Of Breath, Rash and Other (See Comments)    Pt states that he is allergic to strawberries.    . Aspirin Other (See Comments)    Reaction:  GI upset   . Metolazone Other (See Comments)    Pt states that he stopped taking this med due to heart attack like symptoms.   . Oatmeal Nausea And Vomiting  . Orange Juice [Orange Oil] Nausea And Vomiting    All acidic products make him nauseous and upset stomach  . Torsemide Swelling and Other (See Comments)    Reaction:  Swelling of feet/legs   . Diltiazem Palpitations  . Hydralazine Palpitations  . Lipitor [Atorvastatin] Other (See Comments)    Reaction:  Nose bleeds     Medications Reviewed Today    Reviewed by [name], [facility] (Pharmacist) on 10/13/18 at 0855  Med List Status: <None>  Medication Order Taking? Sig Documenting Provider Last Dose Status Informant  albuterol (PROVENTIL) (2.5 MG/3ML) 0.083% nebulizer solution [id number] Yes Take 2.5 mg by nebulization every 6 (six) hours as needed for wheezing or shortness of breath. [provider] Taking Active Self           Med Note Allyne Gee, Irven Easterly Mar 04, 2017  8:53 PM)    atorvastatin (LIPITOR) 40 MG tablet [id number] Yes Take 40 mg by mouth daily.  [provider] Taking Active Self  carvedilol (COREG) 25  MG tablet [id number]  Take 1 tablet (25 mg total) by mouth 2 (two) times daily with a meal for 30 days.  Patient taking differently:  Take 25 mg by mouth daily.    Sherryll Burger, Pratik D, DO  Expired 10/02/18 2359 Self  docusate sodium (COLACE) 100 MG capsule [id number] Yes Take 1 capsule (100 mg total) by mouth 2 (two) times daily. Kathlen Mody, MD Taking Active Self  ferrous gluconate (FERGON) 324 MG tablet [id number] Yes Take 1 tablet (324 mg total) by mouth 2 (two) times daily with a meal. Kathlen Mody, MD Taking Active Self  fluticasone (FLOVENT HFA) 220 MCG/ACT inhaler  [id number] Yes Inhale 2 puffs into the lungs daily.  [provider] Taking Active Self  furosemide (LASIX) 80 MG tablet [id number] Yes Take 1.5 tablets (120 mg total) by mouth 2 (two) times daily. Vassie Loll, MD Taking Active   lisinopril (PRINIVIL,ZESTRIL) 5 MG tablet [id number] Yes Take 1 tablet (5 mg total) by mouth daily. Catarina Hartshorn, MD Taking Active Self  OXYGEN [id number] Yes Inhale 3.5 L into the lungs daily. [provider] Taking Active Self           Med Note Reva Bores, Helaine Chess   Fri Jul 22, 2018  5:23 PM)    pantoprazole (PROTONIX) 40 MG tablet [id number] Yes Take 1 tablet (40 mg total) by mouth daily at 6 (six) AM.  Patient taking differently:  Take 40 mg by mouth every evening.    Kathlen Mody, MD Taking Active Self        Discontinued 09/03/15 [phone] (Entry Error)   potassium chloride SA (K-DUR) 20 MEQ tablet [id number] Yes Take 2 tablets (40 mEq total) by mouth 2 (two) times daily. Vassie Loll, MD Taking Active   rivaroxaban (XARELTO) 20 MG TABS tablet [id number] Yes Take 1 tablet (20 mg total) by mouth daily with breakfast.  Patient taking differently:  Take 20 mg by mouth daily with supper.    Kathlen Mody, MD Taking Active Self           Med Note Dorothy Puffer Sep 18, 2018  9:10 AM)    spironolactone (ALDACTONE) 100 MG tablet [id number] Yes Take 100 mg by mouth daily. For fluid [provider] Taking Active Self  tamsulosin (FLOMAX) 0.4 MG CAPS capsule [id number] Yes Take 1 capsule (0.4 mg total) by mouth daily. Kathlen Mody, MD Taking Active Self  Med List Note Rolene Course 08/15/18 1541): Patient has been verified via pharmacy records ([facility], [place], [place])          Assessment:  ASSESSMENT: Date Discharged from Hospital: 10/07/2018 Date Medication Reconciliation Performed: 10/13/2018  Medications:  New at Discharge: . Furosemide 80 mg 1.5 tablets twice daily  Adjustments at Discharge: . Potassium Chloride 20 MEq 2 tablets  twice daily  Discontinued at Discharge:   Bumetanide   Patient was recently discharged from hospital and all medications have been reviewed.  Drugs sorted by system:  Cardiovascular: Carvedilol, Furosemide, Lisinopril, Xarelto, Spironolactone  Pulmonary/Allergy: Albuterol Nebulizer, Fluticasone, Oxygen  Gastrointestinal: Docusate, Pantoprazole  Genitourinary: Tamsulosin  Vitamins/Minerals/Supplements: Ferrous Gluconate, Potassium,    Medication Review Findings:  . Furosemide dosing-patient reported taking a furosemide dose at noon and the second dose at midnight. Patient was encouraged to take his furosemide doses closer together to help with increased diuresis. Unfortunately, patient said he prefers to take his furosemide when he gets up at noon and then at midnight because that is the best way for  him not to forget the second dose.  . All of the patient's medications were filled at his pharmacy in March with a 3 month supply. (Furosemide is a new script).  . Lifestyle modifications-patient is not following a low sodium diet nor his is limiting his fluid intake. He reported his weight today as 375 pounds. When he was discharged from the hospital he was 356 pounds.  His HgA1c is trending towards pre-diabetes.  Marland Kitchen Spoke with [facility] Nurse, Gae Dry, who confirmed the patient has been part of the paramedicine program in the past and was dismissed due to non-compliance. (Nutrition is a large part of the paramedicine program and they even went out to actually cook in the patient's kitchen. .  He admitted to consuming processed lunch meat, chips, french fries, and hotdogs yesterday:  Lunch Two Turkey/Bologna sandwich [place] Donzetta Sprung [employer] Coffee of Sweet Tea Orange  Snacks: Oatmeal Raisin Cookie Low Sodium Potato Chips Diet Soda Sweet Tea  Dinner 2 Boiled Hot Dogs  Oatmeal Raisin Cookie [place] Donzetta Sprung Class of Sweet Tea (half in half)   Medication Adherence  Findings: Adherence Review   Excellent (no doses missed/week)      Good (no more than 1 dose missed/week)      Partial (2-3 doses missed/week)  Poor (>3 doses missed/week)  Patient with fair understanding of regimen and fair understanding of indications.    Potential of compliance: fair  It may be a good idea for the patient to go to compliance packing to help with adherence. Since he is in [place], the closet pharmacy is [facility].  [facility] does an excellent job with their compliance packs and has staff on hand to assist patients with any issues with their packs.  Medication Assistance Findings:  No medication assistance needs identified  Extra Help:  Already receiving Full Extra Help Low Income Subsidy  Plan: . Will route note to PCP..   . Will follow up with the patient in 1 week to attempt to establish an account with [facility]. . Review Sodium Restrictions . Conference with [facility] Nurse, Gae Dry.  [name], PharmD, BCACP [facility] Clinical Pharmacist [phone]

## 2018-10-14 ENCOUNTER — Other Ambulatory Visit: Payer: Self-pay

## 2018-10-14 NOTE — Patient Outreach (Signed)
[facility]) Care Management  10/14/2018  [name] [birth year] [id number]   Successful outreach with [name].  Reports a morning weight of 379lbs. Denies worsening shortness of breath or chest discomfort since discharge. Reports a brief episode of exertional dyspnea two days ago. States it was relieved with rest and home oxygen. Reports minimal swelling to his lower extremities. Denies changes in activity tolerance.  [name] reports some improvement with medication compliance, but his dietary habits are concerning. His caloric intake remains rather high, and he continues to consume meals high in saturated fats and sodium. He continues to decline referrals for nutritional counseling. Today he reports eating a "light" meal, but still has difficulty understanding how his dietary habits contribute to CHF complications and his overall health.  PLAN -Update [facility] Pharmacist and discuss plan for medication adherence packages. -Mail additional information regarding nutrition and available outreach centers. -Continue frequent outreach.   Katha Cabal [facility] Community Care Manager [phone]

## 2018-10-17 ENCOUNTER — Encounter: Payer: Self-pay | Admitting: Cardiovascular Disease

## 2018-10-17 ENCOUNTER — Other Ambulatory Visit: Payer: Self-pay

## 2018-10-17 ENCOUNTER — Telehealth (INDEPENDENT_AMBULATORY_CARE_PROVIDER_SITE_OTHER): Payer: Medicare Other | Admitting: Cardiovascular Disease

## 2018-10-17 VITALS — Ht 72.0 in | Wt 380.0 lb

## 2018-10-17 DIAGNOSIS — I509 Heart failure, unspecified: Secondary | ICD-10-CM

## 2018-10-17 DIAGNOSIS — Z91119 Patient's noncompliance with dietary regimen due to unspecified reason: Secondary | ICD-10-CM

## 2018-10-17 DIAGNOSIS — I5032 Chronic diastolic (congestive) heart failure: Secondary | ICD-10-CM | POA: Diagnosis not present

## 2018-10-17 DIAGNOSIS — Z9289 Personal history of other medical treatment: Secondary | ICD-10-CM

## 2018-10-17 DIAGNOSIS — I4821 Permanent atrial fibrillation: Secondary | ICD-10-CM

## 2018-10-17 DIAGNOSIS — I10 Essential (primary) hypertension: Secondary | ICD-10-CM

## 2018-10-17 DIAGNOSIS — Z9111 Patient's noncompliance with dietary regimen: Secondary | ICD-10-CM

## 2018-10-17 NOTE — Patient Instructions (Addendum)
Medication Instructions:  Continue all current medications.  Labwork: none  Testing/Procedures: none  Follow-Up: 4 months   Any Other Special Instructions Will Be Listed Below (If Applicable).  If you need a refill on your cardiac medications before your next appointment, please call your pharmacy.\ 

## 2018-10-17 NOTE — Patient Outreach (Signed)
[facility]) Care Management  10/17/2018  [name] Mar 30, 1966 [id number]  Successful outreach with Mr. [place]. Reports a weight of 380lbs. States he feels well. He completed a virtual visit with Dr. Purvis Sheffield earlier today. Reports taking medications as prescribed and feels that lasix is working well. Denies urgent concerns. Will follow up later this week.   PLAN Will continue frequent outreach.    Katha Cabal [facility] Community Care Manager [phone]

## 2018-10-17 NOTE — Progress Notes (Signed)
Virtual Visit via Telephone Note   This visit type was conducted due to national recommendations for restrictions regarding the COVID-19 Pandemic (e.g. social distancing) in an effort to limit this patient's exposure and mitigate transmission in our community.  Due to his co-morbid illnesses, this patient is at least at moderate risk for complications without adequate follow up.  This format is felt to be most appropriate for this patient at this time.  The patient did not have access to video technology/had technical difficulties with video requiring transitioning to audio format only (telephone).  All issues noted in this document were discussed and addressed.  No physical exam could be performed with this format.  Please refer to the patient's chart for his  consent to telehealth for [facility].   Evaluation Performed:  Follow-up visit  Date:  10/17/2018   ID:  [name], DOB 1966/03/17, MRN [id number]  Patient Location: Home Provider Location: Home  PCP:  Pearson Grippe, MD  Cardiologist:  Prentice Docker, MD  Electrophysiologist:  None   Chief Complaint:  CHF  History of Present Illness:    [name] is a 53 y.o. male with a long h/o medication and treatment noncompliance and diastolic heart failure.  He was recently hospitalized for the same.  He spoke with [name] RN, his [facility]. He appeared to be stable at that time. It was noted that "his dietary habits are concerning" given high caloric intake and he continues to consume meals high in saturated fats and sodium.  He has poor insight.  He was discharged on Lasix 120 mg bid.  Says wt 375-377 lbs. Denies any shortness of breath. Chest pain relieved with inhaler. Says leg swelling resolves with Lasix. Likes that better than Bumex.  The patient does not have symptoms concerning for COVID-19 infection (fever, chills, cough, or new shortness of breath).    Past Medical History:  Diagnosis  Date  . Allergic rhinitis   . Asthma   . Atrial fibrillation (HCC)   . CHF (congestive heart failure) (HCC)   . Essential hypertension   . Gastroesophageal reflux disease   . Heart attack (HCC)   . History of cardiac catheterization    Normal coronaries December 2016  . Noncompliance    Major problem leading to declining health and recurrent hospitalization  . Nonischemic cardiomyopathy (HCC)    a. LVEF 20-25% in 2017 with cath in 2016 showing normal cors. b. EF 55% by echo in 03/2018.  . On home O2    3L N/C  . OSA (obstructive sleep apnea)   . Osteoarthritis   . Peptic ulcer disease    Past Surgical History:  Procedure Laterality Date  . CARDIAC CATHETERIZATION N/A 06/14/2015   Procedure: Right/Left Heart Cath and Coronary Angiography;  Surgeon: Runell Gess, MD;  Location: [facility] INVASIVE CV LAB;  Service: Cardiovascular;  Laterality: N/A;     No outpatient medications have been marked as taking for the 10/17/18 encounter (Telemedicine) with [name], MD.     Allergies:   Banana; Bee venom; Food; Aspirin; Metolazone; Oatmeal; Orange juice [orange oil]; Torsemide; Diltiazem; Hydralazine; and Lipitor [atorvastatin]   Social History   Tobacco Use  . Smoking status: Former Smoker    Packs/day: 0.50    Years: 20.00    Pack years: 10.00    Types: Cigarettes    Start date: 04/26/1988    Last attempt to quit: 06/23/2007    Years since quitting: 11.3  .  Smokeless tobacco: Never Used  . Tobacco comment: 1 ppd former smoker  Substance Use Topics  . Alcohol use: No    Alcohol/week: 0.0 standard drinks    Comment: No etoh since 2009  . Drug use: No     Family Hx: The patient's family history includes Aneurysm in his mother; Heart attack in his father; Hypertension in his sister; Stroke in his father. There is no history of Colon cancer, Inflammatory bowel disease, or Liver disease.  ROS:   Please see the history of present illness.     All other systems reviewed  and are negative.   Prior CV studies:   The following studies were reviewed today:  See above  Labs/Other Tests and Data Reviewed:    EKG:  No ECG reviewed.  Recent Labs: 09/01/2018: ALT 14 10/02/2018: B Natriuretic Peptide 328.0 10/05/2018: Magnesium 2.2 10/06/2018: Hemoglobin 15.4; Platelets 252 10/07/2018: BUN 30; Creatinine, Ser 1.42; Potassium 3.8; Sodium 137   Recent Lipid Panel Lab Results  Component Value Date/Time   CHOL 139 06/14/2015 03:28 AM   TRIG 58 06/14/2015 03:28 AM   HDL 28 (L) 06/14/2015 03:28 AM   CHOLHDL 5.0 06/14/2015 03:28 AM   LDLCALC 99 06/14/2015 03:28 AM    Wt Readings from Last 3 Encounters:  10/17/18 (!) 380 lb (172.4 kg)  10/07/18 (!) 358 lb 14.5 oz (162.8 kg)  09/26/18 (!) 376 lb (170.6 kg)     Objective:    Vital Signs:  Ht 6' (1.829 m)   Wt (!) 380 lb (172.4 kg)   BMI 51.54 kg/m    VITAL SIGNS:  reviewed  ASSESSMENT & PLAN:    1.  Chronic diastolic heart failure: He was discharged on Lasix 120 mg bid. He is also on spironolactone 100 mg daily.  Continue carvedilol at present dose. Continue follow up with his [facility], [name], RN.  2.  Nonischemic cardiomyopathy: Left ventricular systolic function is normal by most recent echocardiogram in October 2019, LVEF 55%.  Normal coronaries in December 2016.  3.  Permanent atrial fibrillation: Continue carvedilol for HR control.  Anticoagulated with Xarelto.   4.  Hypertension: No changes to therapy.   COVID-19 Education: The signs and symptoms of COVID-19 were discussed with the patient and how to seek care for testing (follow up with PCP or arrange E-visit).  The importance of social distancing was discussed today.  Time:   Today, I have spent 15 minutes with the patient with telehealth technology discussing the above problems.     Medication Adjustments/Labs and Tests Ordered: Current medicines are reviewed at length with the patient today.  Concerns  regarding medicines are outlined above.   Tests Ordered: No orders of the defined types were placed in this encounter.   Medication Changes: No orders of the defined types were placed in this encounter.   Disposition:  Follow up in 4 month(s)  Signed, Prentice Docker, MD  10/17/2018 1:10 PM    [facility]

## 2018-10-19 ENCOUNTER — Other Ambulatory Visit: Payer: Self-pay

## 2018-10-19 NOTE — Patient Outreach (Signed)
[facility]) Care Management  10/19/2018  ERRETT GOLDSCHMIDT July 19, 1965 [id number]  Weight Check Successful outreach with Mr. [place]. Weight=378lbs. No complaints of shortness of breath. Minimal edema to lower extremities. No changes in activity tolerance. He has not required supplemental oxygen for several days. Overall, reports feeling very well today.  PLAN Will follow up on Friday.   Katha Cabal [facility] Community Care Manager [phone]

## 2018-10-20 ENCOUNTER — Other Ambulatory Visit: Payer: Self-pay | Admitting: Pharmacist

## 2018-10-20 ENCOUNTER — Ambulatory Visit: Payer: Medicare Other | Admitting: Student

## 2018-10-20 DIAGNOSIS — I429 Cardiomyopathy, unspecified: Secondary | ICD-10-CM | POA: Diagnosis not present

## 2018-10-20 DIAGNOSIS — I5023 Acute on chronic systolic (congestive) heart failure: Secondary | ICD-10-CM | POA: Diagnosis not present

## 2018-10-20 DIAGNOSIS — I110 Hypertensive heart disease with heart failure: Secondary | ICD-10-CM | POA: Diagnosis not present

## 2018-10-20 DIAGNOSIS — I482 Chronic atrial fibrillation, unspecified: Secondary | ICD-10-CM | POA: Diagnosis not present

## 2018-10-20 DIAGNOSIS — J811 Chronic pulmonary edema: Secondary | ICD-10-CM | POA: Diagnosis not present

## 2018-10-20 NOTE — Patient Outreach (Signed)
[facility]) Care Management  10/20/2018  [name] Jun 30, 1965 [id number]  Patient was called to follow-up and to discuss the possibility to pill packing. HIPAA identifiers were obtained.   Patient reported that he was feeling "ok".  He stated that he was taking his medications as prescribed.    The idea of pill packing was discussed.  Patient was VERY hesitant. He did not want to switch pharmacies and was concerned about getting 90 day supplies of his medications filled.  Since the patient has LIS/Extra Help, he can receive a 3 month supply of his medications for the same copay as 1 month.   [facility] provides the multi-dose service. However, they will not dispense the pill packs in 90 day supplies. This would mean the patient would have to pay a copay for each month vs 1 copay every 3 months. This would be cost prohibitive for the patient.  [employer] is also in the area of the patient's home. They will deliver and provide excellent pill packing services. Unfortunately, the patient did not want to switch his pharmacy. In addition, [name] prefers to do pill packs in 30 day supplies due to patient safety with medication changes.  Patient was not in agreement with paying multiple copays.  Patient reported his weight today as 384 pounds.   He reported eating [employer], breaded chicken tenders, and drinking a soda today.   Consulted with [facility] Nurse, [name].  Plan: Call patient back in 7-10 business days.   [name], PharmD, BCACP [facility] Clinical Pharmacist [phone]

## 2018-10-21 ENCOUNTER — Other Ambulatory Visit: Payer: Self-pay

## 2018-10-21 NOTE — Patient Outreach (Signed)
[facility]) Care Management  10/21/2018  [name] 04/16/1966 [id number]   Attempted routine outreach. No answer. No option to leave a voice message.   PLAN Will continue frequent outreach.    Katha Cabal [facility] Community Care Manager [phone]

## 2018-10-21 NOTE — Patient Outreach (Signed)
[facility]) Care Management  10/21/2018  SELMER HERRE 1965-10-09 [id number]    Voice message received from Mr. [name]. Reported a weight of 387 lbs.   PLAN Will follow up as scheduled next week.   Katha Cabal [facility] Community Care Manager [phone]

## 2018-10-24 ENCOUNTER — Emergency Department (HOSPITAL_COMMUNITY): Payer: Medicare Other

## 2018-10-24 ENCOUNTER — Other Ambulatory Visit: Payer: Self-pay

## 2018-10-24 ENCOUNTER — Encounter (HOSPITAL_COMMUNITY): Payer: Self-pay

## 2018-10-24 ENCOUNTER — Ambulatory Visit: Payer: Self-pay

## 2018-10-24 ENCOUNTER — Emergency Department (HOSPITAL_COMMUNITY)
Admission: EM | Admit: 2018-10-24 | Discharge: 2018-10-24 | Disposition: A | Discharge status: Home / Self Care | Payer: Medicare Other | Attending: Emergency Medicine | Admitting: Emergency Medicine

## 2018-10-24 DIAGNOSIS — I130 Hypertensive heart and chronic kidney disease with heart failure and stage 1 through stage 4 chronic kidney disease, or unspecified chronic kidney disease: Secondary | ICD-10-CM | POA: Diagnosis not present

## 2018-10-24 DIAGNOSIS — I5042 Chronic combined systolic (congestive) and diastolic (congestive) heart failure: Secondary | ICD-10-CM | POA: Diagnosis not present

## 2018-10-24 DIAGNOSIS — I491 Atrial premature depolarization: Secondary | ICD-10-CM | POA: Diagnosis not present

## 2018-10-24 DIAGNOSIS — R600 Localized edema: Secondary | ICD-10-CM | POA: Diagnosis not present

## 2018-10-24 DIAGNOSIS — Z87891 Personal history of nicotine dependence: Secondary | ICD-10-CM | POA: Insufficient documentation

## 2018-10-24 DIAGNOSIS — J45909 Unspecified asthma, uncomplicated: Secondary | ICD-10-CM | POA: Diagnosis not present

## 2018-10-24 DIAGNOSIS — M79604 Pain in right leg: Secondary | ICD-10-CM | POA: Diagnosis not present

## 2018-10-24 DIAGNOSIS — M7989 Other specified soft tissue disorders: Secondary | ICD-10-CM | POA: Diagnosis not present

## 2018-10-24 DIAGNOSIS — R0602 Shortness of breath: Secondary | ICD-10-CM | POA: Diagnosis not present

## 2018-10-24 DIAGNOSIS — Z79899 Other long term (current) drug therapy: Secondary | ICD-10-CM | POA: Diagnosis not present

## 2018-10-24 DIAGNOSIS — R609 Edema, unspecified: Secondary | ICD-10-CM

## 2018-10-24 DIAGNOSIS — I499 Cardiac arrhythmia, unspecified: Secondary | ICD-10-CM | POA: Diagnosis not present

## 2018-10-24 DIAGNOSIS — R2243 Localized swelling, mass and lump, lower limb, bilateral: Secondary | ICD-10-CM | POA: Diagnosis present

## 2018-10-24 DIAGNOSIS — I5033 Acute on chronic diastolic (congestive) heart failure: Secondary | ICD-10-CM | POA: Diagnosis not present

## 2018-10-24 DIAGNOSIS — I4891 Unspecified atrial fibrillation: Secondary | ICD-10-CM | POA: Diagnosis not present

## 2018-10-24 DIAGNOSIS — N182 Chronic kidney disease, stage 2 (mild): Secondary | ICD-10-CM | POA: Diagnosis not present

## 2018-10-24 LAB — CBC WITH DIFFERENTIAL/PLATELET
Abs Immature Granulocytes: 0.01 10*3/uL (ref 0.00–0.07)
Basophils Absolute: 0 10*3/uL (ref 0.0–0.1)
Basophils Relative: 0 %
Eosinophils Absolute: 0.1 10*3/uL (ref 0.0–0.5)
Eosinophils Relative: 2 %
HCT: 40 % (ref 39.0–52.0)
Hemoglobin: 12.5 g/dL — ABNORMAL LOW (ref 13.0–17.0)
Immature Granulocytes: 0 %
Lymphocytes Relative: 21 %
Lymphs Abs: 1 10*3/uL (ref 0.7–4.0)
MCH: 27.7 pg (ref 26.0–34.0)
MCHC: 31.3 g/dL (ref 30.0–36.0)
MCV: 88.7 fL (ref 80.0–100.0)
Monocytes Absolute: 0.5 10*3/uL (ref 0.1–1.0)
Monocytes Relative: 10 %
Neutro Abs: 3.2 10*3/uL (ref 1.7–7.7)
Neutrophils Relative %: 67 %
Platelets: 166 10*3/uL (ref 150–400)
RBC: 4.51 MIL/uL (ref 4.22–5.81)
RDW: 14.2 % (ref 11.5–15.5)
WBC: 4.8 10*3/uL (ref 4.0–10.5)
nRBC: 0 % (ref 0.0–0.2)

## 2018-10-24 LAB — BASIC METABOLIC PANEL
Anion gap: 8 (ref 5–15)
BUN: 13 mg/dL (ref 6–20)
CO2: 26 mmol/L (ref 22–32)
Calcium: 8.5 mg/dL — ABNORMAL LOW (ref 8.9–10.3)
Chloride: 107 mmol/L (ref 98–111)
Creatinine, Ser: 1.1 mg/dL (ref 0.61–1.24)
GFR calc Af Amer: >60 mL/min (ref >60)
GFR calc non Af Amer: >60 mL/min (ref >60)
Glucose, Bld: 107 mg/dL — ABNORMAL HIGH (ref 70–99)
Potassium: 3.2 mmol/L — ABNORMAL LOW (ref 3.5–5.1)
Sodium: 141 mmol/L (ref 135–145)

## 2018-10-24 LAB — BRAIN NATRIURETIC PEPTIDE: B Natriuretic Peptide: 275 pg/mL — ABNORMAL HIGH (ref 0.0–100.0)

## 2018-10-24 LAB — TROPONIN I: Troponin I: <0.03 ng/mL (ref <0.03)

## 2018-10-24 MED ORDER — ACETAMINOPHEN 500 MG PO TABS
1000.0000 mg | ORAL_TABLET | Freq: Once | ORAL | Status: AC
  Administered 2018-10-24: 1000 mg via ORAL
  Filled 2018-10-24: qty 2

## 2018-10-24 MED ORDER — FUROSEMIDE 10 MG/ML IJ SOLN
INTRAMUSCULAR | Status: AC
  Filled 2018-10-24: qty 20

## 2018-10-24 MED ORDER — FUROSEMIDE 10 MG/ML IJ SOLN
120.0000 mg | Freq: Once | INTRAVENOUS | Status: AC
  Administered 2018-10-24: 120 mg via INTRAVENOUS
  Filled 2018-10-24: qty 12

## 2018-10-24 NOTE — ED Provider Notes (Addendum)
[facility] EMERGENCY DEPARTMENT Provider Note   CSN: [id number] Arrival date & time: 10/24/18  1202    History   Chief Complaint Chief Complaint  Patient presents with  . Leg Swelling    HPI [name] is a 53 y.o. male.     HPI  He presents for evaluation of "burning pain in legs."  He also complains of increasing weight to 380 pounds, leg swelling, and shortness of breath.  Similar problem previously.  Discharged from hospital, about 2 weeks ago after diuresis of nearly 30 pounds.  He is followed closely by home health services, and they have noted his weight has steadily increased.  He states the "new medicine Lasix," is making her legs "burn."  He denies fever, chills, cough, nausea, vomiting, focal weakness or paresthesia.  He states that he is taking his medicine as directed.  There are no other known modifying factors.   Past Medical History:  Diagnosis Date  . Allergic rhinitis   . Asthma   . Atrial fibrillation (HCC)   . CHF (congestive heart failure) (HCC)   . Essential hypertension   . Gastroesophageal reflux disease   . Heart attack (HCC)   . History of cardiac catheterization    Normal coronaries December 2016  . Noncompliance    Major problem leading to declining health and recurrent hospitalization  . Nonischemic cardiomyopathy (HCC)    a. LVEF 20-25% in 2017 with cath in 2016 showing normal cors. b. EF 55% by echo in 03/2018.  . On home O2    3L N/C  . OSA (obstructive sleep apnea)   . Osteoarthritis   . Peptic ulcer disease     Patient Active Problem List   Diagnosis Date Noted  . Chronic obstructive pulmonary disease (HCC)   . Benign prostatic hyperplasia without lower urinary tract symptoms   . Acute on chronic combined systolic and diastolic CHF (congestive heart failure) (HCC) 10/02/2018  . AKI (acute kidney injury) (HCC) 09/18/2018  . Acute on chronic combined systolic and diastolic congestive heart failure (HCC) 08/15/2018  . SOB  (shortness of breath) 08/03/2018  . Volume overload 07/22/2018  . Acute systolic CHF (congestive heart failure) (HCC) 07/04/2018  . Acute on chronic combined systolic and diastolic heart failure (HCC) 04/01/2018  . Hypomagnesemia 04/01/2018  . Acute on chronic diastolic CHF (congestive heart failure) (HCC) 03/11/2018  . Pressure injury of skin 03/11/2018  . CHF (congestive heart failure) (HCC) 09/08/2017  . Altered mental status 05/11/2017  . Acute exacerbation of CHF (congestive heart failure) (HCC) 04/20/2017  . Acute on chronic systolic (congestive) heart failure (HCC) 04/19/2017  . CHF exacerbation (HCC) 04/11/2017  . Chronic respiratory failure with hypoxia (HCC) 04/11/2017  . Encounter for hospice care discussion   . Palliative care encounter   . Goals of care, counseling/discussion   . DNR (do not resuscitate)   . DNR (do not resuscitate) discussion   . Chronic congestive heart failure (HCC) 02/18/2017  . Acute systolic heart failure (HCC) 01/18/2017  . Pedal edema 12/02/2016  . Acute CHF (congestive heart failure) (HCC) 11/09/2016  . Acute on chronic systolic CHF (congestive heart failure) (HCC) 08/19/2016  . CKD (chronic kidney disease), stage II 05/17/2016  . Coronary arteries, normal Dec 2016 01/23/2016  . Chronic anticoagulation-Xarelto 01/23/2016  . NSVT (nonsustained ventricular tachycardia) (HCC) 01/23/2016  . Elevated troponin 12/21/2015  . Nonischemic cardiomyopathy (HCC)   . Morbid obesity due to excess calories (HCC)   . Noncompliance with diet and  medication regimen 12/02/2015  . OSA (obstructive sleep apnea) 09/20/2015  . Pulmonary hypertension (HCC) 09/19/2015  . Normocytic anemia 09/19/2015  . Atrial fibrillation, chronic   . Dyspnea on exertion 05/28/2015  . Acute respiratory failure with hypoxia (HCC) 04/01/2015  . Hypokalemia 04/01/2015  . Obesity hypoventilation syndrome (HCC) 08/08/2014  . GERD (gastroesophageal reflux disease) 08/08/2014  . Edema  of right lower extremity 06/21/2014  . Fecal urgency 06/21/2014  . Asthma 02/11/2009  . Hyperlipidemia 07/12/2008  . OBESITY, MORBID 07/12/2008  . Anxiety state 07/12/2008  . DEPRESSION 07/12/2008  . Essential hypertension 07/12/2008  . ALLERGIC RHINITIS 07/12/2008  . Peptic ulcer 07/12/2008  . OSTEOARTHRITIS 07/12/2008    Past Surgical History:  Procedure Laterality Date  . CARDIAC CATHETERIZATION N/A 06/14/2015   Procedure: Right/Left Heart Cath and Coronary Angiography;  Surgeon: Runell Gess, MD;  Location: [facility] INVASIVE CV LAB;  Service: Cardiovascular;  Laterality: N/A;        Home Medications    Prior to Admission medications   Medication Sig Start Date End Date Taking? Authorizing Provider  albuterol (PROVENTIL) (2.5 MG/3ML) 0.083% nebulizer solution Take 2.5 mg by nebulization every 6 (six) hours as needed for wheezing or shortness of breath.   Yes [provider]  atorvastatin (LIPITOR) 40 MG tablet Take 40 mg by mouth daily.  09/05/18  Yes [provider]  carvedilol (COREG) 25 MG tablet Take 1 tablet (25 mg total) by mouth 2 (two) times daily with a meal for 30 days. Patient taking differently: Take 25 mg by mouth 2 (two) times a day.  08/18/18 10/24/18 Yes Shah, Pratik D, DO  docusate sodium (COLACE) 100 MG capsule Take 1 capsule (100 mg total) by mouth 2 (two) times daily. Patient taking differently: Take 100 mg by mouth daily as needed for mild constipation or moderate constipation.  06/25/16  Yes Kathlen Mody, MD  ferrous gluconate (FERGON) 324 MG tablet Take 1 tablet (324 mg total) by mouth 2 (two) times daily with a meal. Patient taking differently: Take 324 mg by mouth daily.  06/25/16  Yes Kathlen Mody, MD  fluticasone (FLOVENT HFA) 220 MCG/ACT inhaler Inhale 2 puffs into the lungs daily as needed (for shortness of breath).    Yes [provider]  furosemide (LASIX) 40 MG tablet Take 40 mg by mouth 2 (two) times a day. Takes 80mg  with 40mg   for a total of 120mg  twice daily 10/11/18  Yes [provider]  furosemide (LASIX) 80 MG tablet Take 1.5 tablets (120 mg total) by mouth 2 (two) times daily. Patient taking differently: Take 80 mg by mouth 2 (two) times daily.  10/07/18 01/05/19 Yes Vassie Loll, MD  lisinopril (PRINIVIL,ZESTRIL) 5 MG tablet Take 1 tablet (5 mg total) by mouth daily. 09/03/18  Yes Tat, Onalee Hua, MD  OXYGEN Inhale 3.5 L into the lungs continuous.    Yes [provider]  pantoprazole (PROTONIX) 40 MG tablet Take 1 tablet (40 mg total) by mouth daily at 6 (six) AM. Patient taking differently: Take 40 mg by mouth daily.  06/26/16  Yes Kathlen Mody, MD  potassium chloride SA (K-DUR) 20 MEQ tablet Take 2 tablets (40 mEq total) by mouth 2 (two) times daily. 10/07/18  Yes Vassie Loll, MD  rivaroxaban (XARELTO) 20 MG TABS tablet Take 1 tablet (20 mg total) by mouth daily with breakfast. Patient taking differently: Take 20 mg by mouth daily with supper.  06/26/16  Yes Kathlen Mody, MD  spironolactone (ALDACTONE) 100 MG tablet Take  100 mg by mouth daily. For fluid 03/18/17  Yes [provider]  tamsulosin (FLOMAX) 0.4 MG CAPS capsule Take 1 capsule (0.4 mg total) by mouth daily. 06/25/16  Yes Kathlen Mody, MD  potassium chloride 20 MEQ TBCR Take 20 mEq by mouth 2 (two) times daily. 09/03/15 09/03/15  Ward, Layla Maw, DO    Family History Family History  Problem Relation Age of Onset  . Stroke Father   . Heart attack Father   . Aneurysm Mother        Cerebral aneurysm  . Hypertension Sister   . Colon cancer Neg Hx   . Inflammatory bowel disease Neg Hx   . Liver disease Neg Hx     Social History Social History   Tobacco Use  . Smoking status: Former Smoker    Packs/day: 0.50    Years: 20.00    Pack years: 10.00    Types: Cigarettes    Start date: 04/26/1988    Last attempt to quit: 06/23/2007    Years since quitting: 11.3  . Smokeless tobacco: Never Used  . Tobacco comment: 1 ppd former  smoker  Substance Use Topics  . Alcohol use: No    Alcohol/week: 0.0 standard drinks    Comment: No etoh since 2009  . Drug use: No     Allergies   Banana; Bee venom; Strawberry flavor; Aspirin; Metolazone; Oatmeal; Orange juice [orange oil]; Torsemide; Diltiazem; Hydralazine; and Lipitor [atorvastatin]   Review of Systems Review of Systems  All other systems reviewed and are negative.    Physical Exam Updated Vital Signs BP (!) 176/111   Pulse 90   Resp (!) 23   Wt (!) 176.1 kg   SpO2 96%   BMI 52.64 kg/m   Physical Exam Vitals signs and nursing note reviewed.  Constitutional:      Appearance: He is well-developed. He is obese.     Comments: Morbid obesity  HENT:     Head: Normocephalic and atraumatic.     Right Ear: External ear normal.     Left Ear: External ear normal.     Mouth/Throat:     Mouth: Mucous membranes are moist.  Eyes:     Conjunctiva/sclera: Conjunctivae normal.     Pupils: Pupils are equal, round, and reactive to light.  Neck:     Musculoskeletal: Normal range of motion and neck supple.     Trachea: Phonation normal.  Cardiovascular:     Rate and Rhythm: Normal rate and regular rhythm.     Heart sounds: Normal heart sounds.  Pulmonary:     Effort: Pulmonary effort is normal. No respiratory distress.     Breath sounds: Normal breath sounds. No stridor.  Abdominal:     General: There is no distension.     Palpations: Abdomen is soft.     Tenderness: There is no abdominal tenderness. There is no guarding.  Musculoskeletal: Normal range of motion.     Comments: Bilateral lower leg chronic edema, right greater than left.  Legs mildly tender to palpation.  No erythema, drainage or redness of the legs.  Skin:    General: Skin is warm and dry.  Neurological:     Mental Status: He is alert and oriented to person, place, and time.     Cranial Nerves: No cranial nerve deficit.     Sensory: No sensory deficit.     Motor: No abnormal muscle tone.      Coordination: Coordination normal.  Psychiatric:  Mood and Affect: Mood normal.        Behavior: Behavior normal.        Thought Content: Thought content normal.        Judgment: Judgment normal.      ED Treatments / Results  Labs (all labs ordered are listed, but only abnormal results are displayed) Labs Reviewed  BASIC METABOLIC PANEL - Abnormal; Notable for the following components:      Result Value   Potassium 3.2 (*)    Glucose, Bld 107 (*)    Calcium 8.5 (*)    All other components within normal limits  BRAIN NATRIURETIC PEPTIDE - Abnormal; Notable for the following components:   B Natriuretic Peptide 275.0 (*)    All other components within normal limits  CBC WITH DIFFERENTIAL/PLATELET - Abnormal; Notable for the following components:   Hemoglobin 12.5 (*)    All other components within normal limits  TROPONIN I    EKG EKG Interpretation  Date/Time:  Monday Oct 24 2018 12:12:25 EDT Ventricular Rate:  97 PR Interval:    QRS Duration: 83 QT Interval:  367 QTC Calculation: 467 R Axis:   77 Text Interpretation:  Atrial fibrillation Borderline repolarization abnormality Premature ventricular complexes since last tracing no significant change Confirmed by Mancel Bale [id number]) on 10/24/2018 12:18:58 PM   Radiology Dg Chest 2 View  Result Date: 10/24/2018 CLINICAL DATA:  Shortness of breath. EXAM: CHEST - 2 VIEW COMPARISON:  Radiograph of October 02, 2018. FINDINGS: Stable cardiomegaly with central pulmonary vascular congestion. No pneumothorax or pleural effusion is noted. No acute pulmonary disease is noted. Bony thorax is unremarkable. IMPRESSION: Stable cardiomegaly with central pulmonary vascular congestion Electronically Signed   By: Lupita Raider M.D.   On: 10/24/2018 13:15    Procedures .Critical Care Performed by: Mancel Bale, MD Authorized by: Mancel Bale, MD   Critical care provider statement:    Critical care time (minutes):  35    Critical care start time:  10/24/2018 12:15 PM   Critical care end time:  10/24/2018 7:44 PM   Critical care time was exclusive of:  Separately billable procedures and treating other patients   Critical care was necessary to treat or prevent imminent or life-threatening deterioration of the following conditions:  Cardiac failure   Critical care was time spent personally by me on the following activities:  Blood draw for specimens, development of treatment plan with patient or surrogate, discussions with consultants, evaluation of patient's response to treatment, examination of patient, obtaining history from patient or surrogate, ordering and performing treatments and interventions, ordering and review of laboratory studies, pulse oximetry, re-evaluation of patient's condition, review of old charts and ordering and review of radiographic studies   (including critical care time)  Medications Ordered in ED Medications  furosemide (LASIX) 120 mg in dextrose 5 % 50 mL IVPB (0 mg Intravenous Stopped 10/24/18 1423)  acetaminophen (TYLENOL) tablet 1,000 mg (1,000 mg Oral Given 10/24/18 1328)  furosemide (LASIX) 120 mg in dextrose 5 % 50 mL IVPB (0 mg Intravenous Stopped 10/24/18 1936)     Initial Impression / Assessment and Plan / ED Course  I have reviewed the triage vital signs and the nursing notes.  Pertinent labs & imaging results that were available during my care of the patient were reviewed by me and considered in my medical decision making (see chart for details).  Clinical Course as of Oct 23 1941  [facility] Oct 24, 2018  1352 Mild elevation  Brain  natriuretic peptide(!) [EW]  1353 Normal except potassium low, calcium low  Basic metabolic panel(!) [EW]  1353 Normal  Troponin I - Once [EW]  1353 Normal except hemoglobin low  CBC with Differential(!) [EW]    Clinical Course User Index [EW] Mancel Bale, MD        Patient Vitals for the past 24 hrs:  BP Pulse Resp SpO2 Weight  10/24/18  1930 (!) 176/111 90 (!) 23 96 % -  10/24/18 1900 (!) 158/110 - 13 - -  10/24/18 1830 (!) 159/111 83 16 100 % -  10/24/18 1800 (!) 153/106 96 (!) 22 100 % -  10/24/18 1730 (!) 169/89 92 (!) 25 94 % -  10/24/18 1700 (!) 157/100 66 13 98 % -  10/24/18 1600 (!) 160/92 93 17 95 % -  10/24/18 1530 (!) 175/106 92 (!) 22 100 % -  10/24/18 1500 (!) 156/122 96 (!) 23 98 % -  10/24/18 1430 (!) 172/117 78 (!) 21 98 % -  10/24/18 1417 (!) 150/102 88 18 98 % -  10/24/18 1330 (!) 136/118 97 18 96 % -  10/24/18 1300 (!) 156/106 98 12 94 % -  10/24/18 1242 - - - - (!) 176.1 kg  10/24/18 1230 (!) 163/106 91 18 94 % -   2:01 PM-Consult complete with both, Dr. Gwenlyn Perking. Patient case explained and discussed.  He knows the patient well.  He discharged him on 10/07/2018.  He recommends 2 doses of Lasix in the ED and admonishment for medication and dietary compliance, with outpatient follow-up. Call ended at 2:28 PM.   6:04 PM Reevaluation with update and discussion. After initial assessment and treatment, an updated evaluation reveals he is comfortable now and has diuresed greater than 3 L of urine.  Findings discussed with the patient, he agrees with treatment plan.  We will discharge after second dose of Lasix.  All questions answered. Mancel Bale   Medical Decision Making: Patient presenting for peripheral edema with marked weight gain.  At hospital discharge, weight was 163 kg (358 pounds), today weight is 176 kg, (387 pounds).  This is a dramatic weight gain of 13 kg's, or 29 pounds, in 17 days.  Renal function is normal.  He appears to have mild central edema with pulmonary vascular congestion on imaging, and mildly elevated BNP.  I strongly suspect noncompliance with dietary measures as the major etiology of his significant fluid retention with weight gain.  Note that previously, under observation, he had exceptional diuresis, of the large volume fluid during a relatively short hospitalization.  He was seen by  his cardiologist, 1 week ago, since that time he has gained 4 kg.  At that visit no changes were made in his treatment plan.  He also takes spironolactone.  Doubt ACS, PE or pneumonia.  Currently has stable chronic atrial fibrillation, and is on Xarelto.  Continues to have significant weight gain, despite frequent evaluation as an outpatient by telephone outreach.   CRITICAL CARE- yes Performed by: Mancel Bale  Nursing Notes Reviewed/ Care Coordinated Applicable Imaging Reviewed Interpretation of Laboratory Data incorporated into ED treatment  The patient appears reasonably screened and/or stabilized for discharge and I doubt any other medical condition or other [facility] requiring further screening, evaluation, or treatment in the ED at this time prior to discharge.  Plan: Home Medications-continue usual; Home Treatments-low-salt diet regular medications; return here if the recommended treatment, does not improve the symptoms; Recommended follow up-PCP follow-up 1 week and as  needed    Final Clinical Impressions(s) / ED Diagnoses   Final diagnoses:  Acute on chronic diastolic heart failure [facility](HCC)  Peripheral edema    ED Discharge Orders    None       Mancel BaleWentz, Chike Farrington, MD 10/24/18 1944    Mancel BaleWentz, Blanche Gallien, MD 10/24/18 1944

## 2018-10-24 NOTE — ED Notes (Signed)
Pt called me in room stating he feels sob and need O2; pt states he is on 3.5L of O2 all the time at home

## 2018-10-24 NOTE — ED Notes (Signed)
Have paged [facility] for Lasix drip

## 2018-10-24 NOTE — ED Notes (Signed)
Have notified pharmacy about Lasix drip

## 2018-10-24 NOTE — ED Triage Notes (Signed)
Pt is having SOB and right leg pain from edema for the last day. Pt has a history of COPD and CHF. CBG 132. 96% on RA. Clear lung sounds bilaterally.

## 2018-10-24 NOTE — Discharge Instructions (Addendum)
Continue taking all of your medicines as prescribed.  Stay on a low-salt diet, as on the attached information.  Call your doctor for a follow-up appointment in 2 to 3 days.

## 2018-10-25 ENCOUNTER — Other Ambulatory Visit: Payer: Self-pay

## 2018-10-25 ENCOUNTER — Other Ambulatory Visit: Payer: Self-pay | Admitting: Pharmacist

## 2018-10-25 NOTE — Patient Outreach (Signed)
[facility]) Care Management  10/25/2018  [name] 1966/01/06 [id number]   Successful outreach with [name]. He was evaluated in the ED on yesterday. Per chart, he complained of shortness of breath and edema to his lower extremities. He presented with a weight of 387 lbs.  Today he reports feeling well, but his current weight is 384 lbs. Denies complaints of shortness of breath. Denies increased edema or "burning" sensation to his lower extremities. Reports taking medications as prescribed.  [name]'s sodium intake remains very high despite multiple discussions regarding his diet. He admits to eating several hot dogs and fried foods after being discharged from the ED. He was strongly encouraged to decrease his sodium consumption, as this is likely contributing to his rapid weight gain. PLAN Will follow up this week.   Katha Cabal [facility] Community Care Manager [phone]

## 2018-10-25 NOTE — Patient Outreach (Signed)
[facility]) Care Management  10/25/2018  [name] 1966/06/06 [id number]   Patient was called for follow up. A gentleman answered the phone and said the patient was not there. HIPAA compliant message was left for the patient. From chart review, the patient visited the ED yesterday with a chief complaint of leg swelling and buring.  From the notes, the patient reported that the "new furosemide" (torsemide) was causing his legs to swell.  Patient was discharged from an inpatient stay on 10/07/2018. At discharge, his weight was 356. When patient presented to the ED his weight was 387.  Patient continues to eat foods that are very high in sodium and does not limit his fluid intake.    The evaluation by the provider (Dr.Elliott Effie Shy) at the ED visit on 10/24/2018 stated the following:    "I strongly suspect noncompliance with dietary measures as the major etiology of his significant fluid retention with weight gain.  Note that previously, under observation, he had exceptional diuresis, of the large volume fluid during a relatively short hospitalization.  He was seen by his cardiologist, 1 week ago, since that time he has gained 4 kg.  At that visit no changes were made in his treatment plan.  He also takes spironolactone.  Doubt ACS, PE or pneumonia.  Currently has stable chronic atrial fibrillation, and is on Xarelto.  Continues to have significant weight gain, despite frequent evaluation as an outpatient by telephone outreach."  Plan: Call patient back in 3-5 business days.   [name], PharmD, BCACP [facility] Clinical Pharmacist [phone]

## 2018-10-26 ENCOUNTER — Ambulatory Visit: Payer: Self-pay

## 2018-10-26 IMAGING — CR DG CHEST 1V PORT
1 series · 1 of 1 positions shown · non-contrast
Comparison: 02/05/2017, 01/28/2017 and earlier.

CLINICAL DATA: Two-day history of shortness of breath and bilateral
lower extremity edema. Current history of nonischemic cardiomyopathy
and CHF. Prior MI.

EXAM:
PORTABLE CHEST 1 VIEW

[portable]
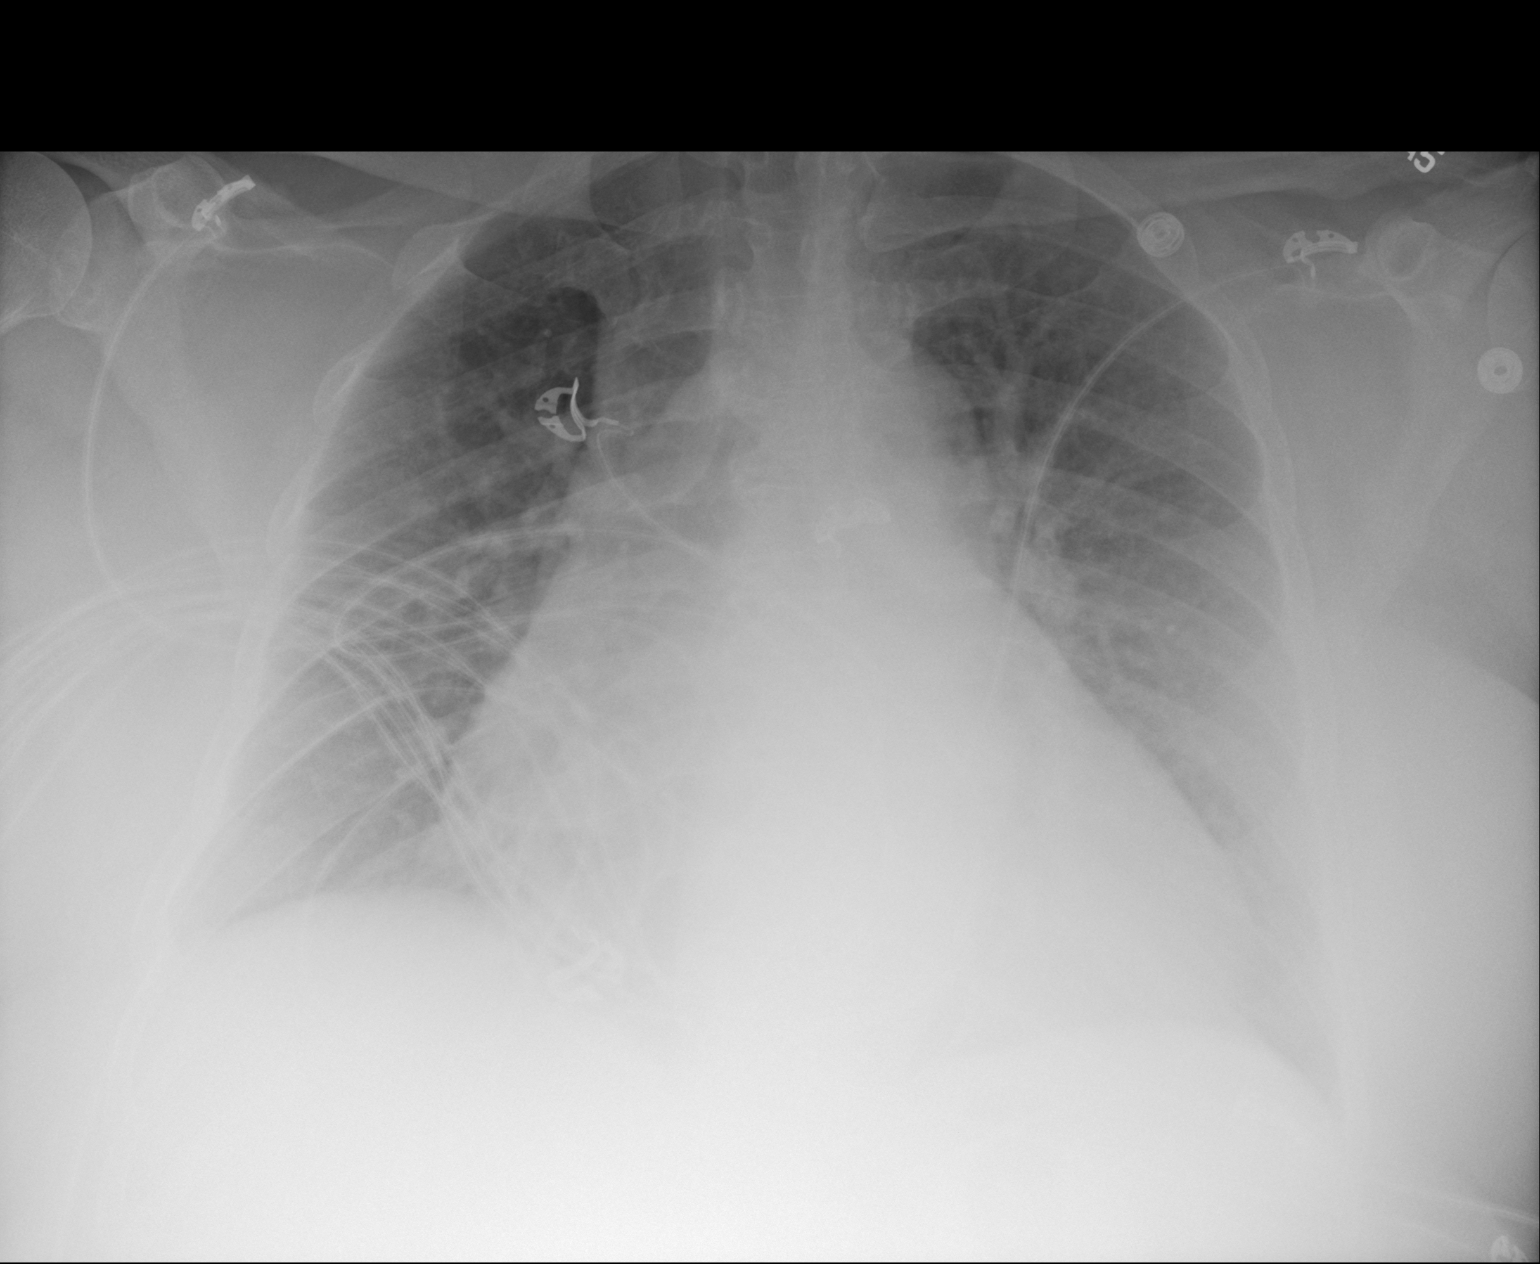

[1 of 1 positions shown; findings below may reference images not displayed]

FINDINGS: Cardiac silhouette markedly enlarged, unchanged. Pulmonary venous
hypertension and minimal to mild interstitial pulmonary edema,
similar to the most recent examination 2 weeks ago. Small bilateral
pleural effusions suspected. No confluent airspace consolidation.
IMPRESSION: Minimal to mild CHF, with stable marked cardiomegaly and minimal to
mild interstitial pulmonary edema. Likely small bilateral pleural
effusions.

## 2018-10-27 ENCOUNTER — Ambulatory Visit: Payer: Self-pay | Admitting: Pharmacist

## 2018-10-27 DIAGNOSIS — E785 Hyperlipidemia, unspecified: Secondary | ICD-10-CM | POA: Diagnosis not present

## 2018-10-27 DIAGNOSIS — I482 Chronic atrial fibrillation, unspecified: Secondary | ICD-10-CM | POA: Diagnosis not present

## 2018-10-27 DIAGNOSIS — I10 Essential (primary) hypertension: Secondary | ICD-10-CM | POA: Diagnosis not present

## 2018-10-27 DIAGNOSIS — E876 Hypokalemia: Secondary | ICD-10-CM | POA: Diagnosis not present

## 2018-10-27 DIAGNOSIS — I110 Hypertensive heart disease with heart failure: Secondary | ICD-10-CM | POA: Diagnosis not present

## 2018-10-28 ENCOUNTER — Other Ambulatory Visit: Payer: Self-pay

## 2018-10-28 NOTE — Patient Outreach (Signed)
[facility]) Care Management  10/28/2018  [name] Aug 26, 1965 [id number]   Outreach with [name]. Reports a morning weight of 382lbs. Weight was 381lbs on yesterday. Reports shortness of breath with exertion and  swelling to his lower extremities. Discussed diet and medications. He reports eating and taking his morning medications between 2am and 3am today. His meal consisted of a bologna sandwich and chicken.  [name] admits to continuously consuming high sodium foods. He continues to decline referrals for nutritional counseling. Based on our conversation today, he does not intend to change his diet. He states, "it's not the food, it's the medicine." He prefers IV diuretics. He was previously enrolled in the [facility] program. Discussed reenrollment. This would allow him the option of receiving IV therapy in the home. He declined. He was also previously referred to [facility]. Declined offer for a new referral. Reports he does not want to participate.  PLAN Will continue outreach.    Katha Cabal [facility] Community Care Manager [phone]

## 2018-10-30 ENCOUNTER — Other Ambulatory Visit: Payer: Self-pay

## 2018-10-30 ENCOUNTER — Encounter (HOSPITAL_COMMUNITY): Payer: Self-pay | Admitting: Emergency Medicine

## 2018-10-30 ENCOUNTER — Emergency Department (HOSPITAL_COMMUNITY): Payer: Medicare Other

## 2018-10-30 ENCOUNTER — Inpatient Hospital Stay (HOSPITAL_COMMUNITY)
Admission: EM | Admit: 2018-10-30 | Discharge: 2018-11-02 | DRG: 291 | Disposition: A | Discharge status: Home Health | Payer: Medicare Other | Attending: Internal Medicine | Admitting: Internal Medicine

## 2018-10-30 DIAGNOSIS — Z9114 Patient's other noncompliance with medication regimen: Secondary | ICD-10-CM | POA: Diagnosis not present

## 2018-10-30 DIAGNOSIS — I428 Other cardiomyopathies: Secondary | ICD-10-CM | POA: Diagnosis not present

## 2018-10-30 DIAGNOSIS — M1990 Unspecified osteoarthritis, unspecified site: Secondary | ICD-10-CM | POA: Diagnosis present

## 2018-10-30 DIAGNOSIS — Z9111 Patient's noncompliance with dietary regimen: Secondary | ICD-10-CM

## 2018-10-30 DIAGNOSIS — I5043 Acute on chronic combined systolic (congestive) and diastolic (congestive) heart failure: Secondary | ICD-10-CM | POA: Diagnosis present

## 2018-10-30 DIAGNOSIS — I4821 Permanent atrial fibrillation: Secondary | ICD-10-CM | POA: Diagnosis present

## 2018-10-30 DIAGNOSIS — I252 Old myocardial infarction: Secondary | ICD-10-CM | POA: Diagnosis not present

## 2018-10-30 DIAGNOSIS — K219 Gastro-esophageal reflux disease without esophagitis: Secondary | ICD-10-CM | POA: Diagnosis not present

## 2018-10-30 DIAGNOSIS — Z9981 Dependence on supplemental oxygen: Secondary | ICD-10-CM

## 2018-10-30 DIAGNOSIS — Z20828 Contact with and (suspected) exposure to other viral communicable diseases: Secondary | ICD-10-CM | POA: Diagnosis present

## 2018-10-30 DIAGNOSIS — Z8249 Family history of ischemic heart disease and other diseases of the circulatory system: Secondary | ICD-10-CM

## 2018-10-30 DIAGNOSIS — J9611 Chronic respiratory failure with hypoxia: Secondary | ICD-10-CM | POA: Diagnosis not present

## 2018-10-30 DIAGNOSIS — Z8711 Personal history of peptic ulcer disease: Secondary | ICD-10-CM

## 2018-10-30 DIAGNOSIS — Z6841 Body Mass Index (BMI) 40.0 and over, adult: Secondary | ICD-10-CM

## 2018-10-30 DIAGNOSIS — Z886 Allergy status to analgesic agent status: Secondary | ICD-10-CM

## 2018-10-30 DIAGNOSIS — Z823 Family history of stroke: Secondary | ICD-10-CM | POA: Diagnosis not present

## 2018-10-30 DIAGNOSIS — Z888 Allergy status to other drugs, medicaments and biological substances status: Secondary | ICD-10-CM

## 2018-10-30 DIAGNOSIS — J449 Chronic obstructive pulmonary disease, unspecified: Secondary | ICD-10-CM | POA: Diagnosis present

## 2018-10-30 DIAGNOSIS — Z87891 Personal history of nicotine dependence: Secondary | ICD-10-CM

## 2018-10-30 DIAGNOSIS — M5489 Other dorsalgia: Secondary | ICD-10-CM | POA: Diagnosis not present

## 2018-10-30 DIAGNOSIS — N182 Chronic kidney disease, stage 2 (mild): Secondary | ICD-10-CM | POA: Diagnosis not present

## 2018-10-30 DIAGNOSIS — Z91030 Bee allergy status: Secondary | ICD-10-CM

## 2018-10-30 DIAGNOSIS — E876 Hypokalemia: Secondary | ICD-10-CM | POA: Diagnosis present

## 2018-10-30 DIAGNOSIS — E785 Hyperlipidemia, unspecified: Secondary | ICD-10-CM | POA: Diagnosis not present

## 2018-10-30 DIAGNOSIS — I130 Hypertensive heart and chronic kidney disease with heart failure and stage 1 through stage 4 chronic kidney disease, or unspecified chronic kidney disease: Secondary | ICD-10-CM | POA: Diagnosis not present

## 2018-10-30 DIAGNOSIS — I10 Essential (primary) hypertension: Secondary | ICD-10-CM | POA: Diagnosis not present

## 2018-10-30 DIAGNOSIS — E662 Morbid (severe) obesity with alveolar hypoventilation: Secondary | ICD-10-CM | POA: Diagnosis present

## 2018-10-30 DIAGNOSIS — R0602 Shortness of breath: Secondary | ICD-10-CM | POA: Diagnosis not present

## 2018-10-30 DIAGNOSIS — Z9119 Patient's noncompliance with other medical treatment and regimen: Secondary | ICD-10-CM | POA: Diagnosis not present

## 2018-10-30 DIAGNOSIS — I272 Pulmonary hypertension, unspecified: Secondary | ICD-10-CM | POA: Diagnosis not present

## 2018-10-30 DIAGNOSIS — R609 Edema, unspecified: Secondary | ICD-10-CM

## 2018-10-30 DIAGNOSIS — Z03818 Encounter for observation for suspected exposure to other biological agents ruled out: Secondary | ICD-10-CM | POA: Diagnosis not present

## 2018-10-30 DIAGNOSIS — Z91018 Allergy to other foods: Secondary | ICD-10-CM

## 2018-10-30 DIAGNOSIS — R069 Unspecified abnormalities of breathing: Secondary | ICD-10-CM | POA: Diagnosis not present

## 2018-10-30 DIAGNOSIS — E8770 Fluid overload, unspecified: Secondary | ICD-10-CM | POA: Diagnosis not present

## 2018-10-30 DIAGNOSIS — Z7901 Long term (current) use of anticoagulants: Secondary | ICD-10-CM | POA: Diagnosis not present

## 2018-10-30 DIAGNOSIS — I110 Hypertensive heart disease with heart failure: Secondary | ICD-10-CM | POA: Diagnosis not present

## 2018-10-30 DIAGNOSIS — Z91119 Patient's noncompliance with dietary regimen due to unspecified reason: Secondary | ICD-10-CM

## 2018-10-30 DIAGNOSIS — I890 Lymphedema, not elsewhere classified: Secondary | ICD-10-CM | POA: Diagnosis present

## 2018-10-30 LAB — COMPREHENSIVE METABOLIC PANEL
ALT: 15 U/L (ref 0–44)
AST: 18 U/L (ref 15–41)
Albumin: 3.5 g/dL (ref 3.5–5.0)
Alkaline Phosphatase: 61 U/L (ref 38–126)
Anion gap: 9 (ref 5–15)
BUN: 13 mg/dL (ref 6–20)
CO2: 29 mmol/L (ref 22–32)
Calcium: 8.5 mg/dL — ABNORMAL LOW (ref 8.9–10.3)
Chloride: 103 mmol/L (ref 98–111)
Creatinine, Ser: 1.04 mg/dL (ref 0.61–1.24)
GFR calc Af Amer: >60 mL/min (ref >60)
GFR calc non Af Amer: >60 mL/min (ref >60)
Glucose, Bld: 97 mg/dL (ref 70–99)
Potassium: 3.5 mmol/L (ref 3.5–5.1)
Sodium: 141 mmol/L (ref 135–145)
Total Bilirubin: 0.9 mg/dL (ref 0.3–1.2)
Total Protein: 7 g/dL (ref 6.5–8.1)

## 2018-10-30 LAB — TROPONIN I
Troponin I: 0.03 ng/mL (ref <0.03)
Troponin I: 0.03 ng/mL (ref <0.03)
Troponin I: 0.03 ng/mL (ref <0.03)

## 2018-10-30 LAB — CBC WITH DIFFERENTIAL/PLATELET
Abs Immature Granulocytes: 0.01 10*3/uL (ref 0.00–0.07)
Basophils Absolute: 0 10*3/uL (ref 0.0–0.1)
Basophils Relative: 0 %
Eosinophils Absolute: 0.1 10*3/uL (ref 0.0–0.5)
Eosinophils Relative: 2 %
HCT: 40.3 % (ref 39.0–52.0)
Hemoglobin: 12.8 g/dL — ABNORMAL LOW (ref 13.0–17.0)
Immature Granulocytes: 0 %
Lymphocytes Relative: 24 %
Lymphs Abs: 1 10*3/uL (ref 0.7–4.0)
MCH: 28 pg (ref 26.0–34.0)
MCHC: 31.8 g/dL (ref 30.0–36.0)
MCV: 88.2 fL (ref 80.0–100.0)
Monocytes Absolute: 0.5 10*3/uL (ref 0.1–1.0)
Monocytes Relative: 11 %
Neutro Abs: 2.7 10*3/uL (ref 1.7–7.7)
Neutrophils Relative %: 63 %
Platelets: 167 10*3/uL (ref 150–400)
RBC: 4.57 MIL/uL (ref 4.22–5.81)
RDW: 14.1 % (ref 11.5–15.5)
WBC: 4.2 10*3/uL (ref 4.0–10.5)
nRBC: 0 % (ref 0.0–0.2)

## 2018-10-30 LAB — BRAIN NATRIURETIC PEPTIDE: B Natriuretic Peptide: 263 pg/mL — ABNORMAL HIGH (ref 0.0–100.0)

## 2018-10-30 LAB — SARS CORONAVIRUS 2 BY RT PCR (HOSPITAL ORDER, PERFORMED IN ~~LOC~~ HOSPITAL LAB): SARS Coronavirus 2: NEGATIVE

## 2018-10-30 MED ORDER — SODIUM CHLORIDE 0.9% FLUSH: 3.0000 mL | INTRAVENOUS | Status: DC | PRN

## 2018-10-30 MED ORDER — SPIRONOLACTONE 25 MG PO TABS
25.0000 mg | ORAL_TABLET | Freq: Every day | ORAL | Status: DC
  Administered 2018-10-30 – 2018-11-02 (×4): 25 mg via ORAL
  Filled 2018-10-30 (×4): qty 1

## 2018-10-30 MED ORDER — ENSURE ENLIVE PO LIQD: 237.0000 mL | Freq: Two times a day (BID) | ORAL | Status: DC

## 2018-10-30 MED ORDER — TAMSULOSIN HCL 0.4 MG PO CAPS
0.4000 mg | ORAL_CAPSULE | Freq: Every day | ORAL | Status: DC
  Administered 2018-10-30 – 2018-11-02 (×4): 0.4 mg via ORAL
  Filled 2018-10-30 (×4): qty 1

## 2018-10-30 MED ORDER — CARVEDILOL 12.5 MG PO TABS
12.5000 mg | ORAL_TABLET | Freq: Two times a day (BID) | ORAL | Status: DC
  Administered 2018-10-30 – 2018-11-02 (×6): 12.5 mg via ORAL
  Filled 2018-10-30 (×6): qty 1

## 2018-10-30 MED ORDER — FERROUS GLUCONATE 324 (38 FE) MG PO TABS
324.0000 mg | ORAL_TABLET | Freq: Two times a day (BID) | ORAL | Status: DC
  Administered 2018-10-30 – 2018-11-02 (×6): 324 mg via ORAL
  Filled 2018-10-30 (×10): qty 1

## 2018-10-30 MED ORDER — ACETAMINOPHEN 325 MG PO TABS
650.0000 mg | ORAL_TABLET | ORAL | Status: DC | PRN
  Administered 2018-10-30: 650 mg via ORAL
  Filled 2018-10-30: qty 2

## 2018-10-30 MED ORDER — FUROSEMIDE 10 MG/ML IJ SOLN
80.0000 mg | Freq: Two times a day (BID) | INTRAMUSCULAR | Status: DC
  Administered 2018-10-30 – 2018-11-02 (×6): 80 mg via INTRAVENOUS
  Filled 2018-10-30 (×6): qty 8

## 2018-10-30 MED ORDER — SODIUM CHLORIDE 0.9% FLUSH
3.0000 mL | Freq: Two times a day (BID) | INTRAVENOUS | Status: DC
  Administered 2018-10-30 – 2018-11-02 (×7): 3 mL via INTRAVENOUS

## 2018-10-30 MED ORDER — SODIUM CHLORIDE 0.9 % IV SOLN: 250.0000 mL | INTRAVENOUS | Status: DC | PRN

## 2018-10-30 MED ORDER — LISINOPRIL 5 MG PO TABS
5.0000 mg | ORAL_TABLET | Freq: Every day | ORAL | Status: DC
  Administered 2018-10-30 – 2018-11-02 (×4): 5 mg via ORAL
  Filled 2018-10-30 (×4): qty 1

## 2018-10-30 MED ORDER — FUROSEMIDE 10 MG/ML IJ SOLN
80.0000 mg | Freq: Once | INTRAMUSCULAR | Status: AC
  Administered 2018-10-30: 80 mg via INTRAVENOUS
  Filled 2018-10-30: qty 8

## 2018-10-30 MED ORDER — ONDANSETRON HCL 4 MG/2ML IJ SOLN: 4.0000 mg | Freq: Four times a day (QID) | INTRAMUSCULAR | Status: DC | PRN

## 2018-10-30 MED ORDER — RIVAROXABAN 20 MG PO TABS
20.0000 mg | ORAL_TABLET | Freq: Every day | ORAL | Status: DC
  Administered 2018-10-30 – 2018-11-01 (×3): 20 mg via ORAL
  Filled 2018-10-30 (×3): qty 1

## 2018-10-30 MED ORDER — TRAMADOL HCL 50 MG PO TABS
50.0000 mg | ORAL_TABLET | Freq: Four times a day (QID) | ORAL | Status: DC | PRN
  Administered 2018-10-30: 50 mg via ORAL
  Filled 2018-10-30: qty 1

## 2018-10-30 MED ORDER — ATORVASTATIN CALCIUM 40 MG PO TABS
40.0000 mg | ORAL_TABLET | Freq: Every day | ORAL | Status: DC
  Administered 2018-10-30 – 2018-11-02 (×4): 40 mg via ORAL
  Filled 2018-10-30 (×4): qty 1

## 2018-10-30 MED ORDER — POTASSIUM CHLORIDE CRYS ER 20 MEQ PO TBCR
40.0000 meq | EXTENDED_RELEASE_TABLET | Freq: Two times a day (BID) | ORAL | Status: DC
  Administered 2018-10-30 – 2018-11-02 (×7): 40 meq via ORAL
  Filled 2018-10-30 (×7): qty 2

## 2018-10-30 NOTE — ED Provider Notes (Signed)
Will contact hospitalist for admission for fluid retention and acute on chronic CHF.  Patient's weight today is 176 kg.  When he was last admitted in April his discharge weight was 162 kg.  Patient is also starting to use oxygen more frequently.  He feels short of breath.  Chest x-ray with some mild evidence of CHF.  Patient received 80 of Lasix here with some diuresis.  Patient states he is taking the Lasix as the way supposed to.  But he said he is taking it 3 times a day.  He supposed to take 120 mg twice a day.  So not sure if he is actually compliant on the medicine or not.  Will discuss with the hospitalist.   Vanetta Mulders, MD 10/30/18 [phone]

## 2018-10-30 NOTE — ED Provider Notes (Signed)
[facility] EMERGENCY DEPARTMENT Provider Note   CSN: [id number] Arrival date & time: 10/30/18  0548    History   Chief Complaint Chief Complaint  Patient presents with  . Shortness of Breath    HPI [name] is a 53 y.o. male.     Patient complains of shortness of breath.  Patient has a history of congestive heart failure.  He has been having more swelling  The history is provided by the patient. No language interpreter was used.  Shortness of Breath  Severity:  Moderate Onset quality:  Sudden Timing:  Constant Progression:  Worsening Chronicity:  Recurrent Context: activity   Relieved by:  Nothing Worsened by:  Nothing Ineffective treatments:  None tried Associated symptoms: no abdominal pain, no chest pain, no cough, no headaches and no rash     Past Medical History:  Diagnosis Date  . Allergic rhinitis   . Asthma   . Atrial fibrillation (HCC)   . CHF (congestive heart failure) (HCC)   . Essential hypertension   . Gastroesophageal reflux disease   . Heart attack (HCC)   . History of cardiac catheterization    Normal coronaries December 2016  . Noncompliance    Major problem leading to declining health and recurrent hospitalization  . Nonischemic cardiomyopathy (HCC)    a. LVEF 20-25% in 2017 with cath in 2016 showing normal cors. b. EF 55% by echo in 03/2018.  . On home O2    3L N/C  . OSA (obstructive sleep apnea)   . Osteoarthritis   . Peptic ulcer disease     Patient Active Problem List   Diagnosis Date Noted  . Chronic obstructive pulmonary disease (HCC)   . Benign prostatic hyperplasia without lower urinary tract symptoms   . Acute on chronic combined systolic and diastolic CHF (congestive heart failure) (HCC) 10/02/2018  . AKI (acute kidney injury) (HCC) 09/18/2018  . Acute on chronic combined systolic and diastolic congestive heart failure (HCC) 08/15/2018  . SOB (shortness of breath) 08/03/2018  . Volume overload 07/22/2018  . Acute  systolic CHF (congestive heart failure) (HCC) 07/04/2018  . Acute on chronic combined systolic and diastolic heart failure (HCC) 04/01/2018  . Hypomagnesemia 04/01/2018  . Acute on chronic diastolic CHF (congestive heart failure) (HCC) 03/11/2018  . Pressure injury of skin 03/11/2018  . CHF (congestive heart failure) (HCC) 09/08/2017  . Altered mental status 05/11/2017  . Acute exacerbation of CHF (congestive heart failure) (HCC) 04/20/2017  . Acute on chronic systolic (congestive) heart failure (HCC) 04/19/2017  . CHF exacerbation (HCC) 04/11/2017  . Chronic respiratory failure with hypoxia (HCC) 04/11/2017  . Encounter for hospice care discussion   . Palliative care encounter   . Goals of care, counseling/discussion   . DNR (do not resuscitate)   . DNR (do not resuscitate) discussion   . Chronic congestive heart failure (HCC) 02/18/2017  . Acute systolic heart failure (HCC) 01/18/2017  . Pedal edema 12/02/2016  . Acute CHF (congestive heart failure) (HCC) 11/09/2016  . Acute on chronic systolic CHF (congestive heart failure) (HCC) 08/19/2016  . CKD (chronic kidney disease), stage II 05/17/2016  . Coronary arteries, normal Dec 2016 01/23/2016  . Chronic anticoagulation-Xarelto 01/23/2016  . NSVT (nonsustained ventricular tachycardia) (HCC) 01/23/2016  . Elevated troponin 12/21/2015  . Nonischemic cardiomyopathy (HCC)   . Morbid obesity due to excess calories (HCC)   . Noncompliance with diet and medication regimen 12/02/2015  . OSA (obstructive sleep apnea) 09/20/2015  . Pulmonary hypertension (HCC)  09/19/2015  . Normocytic anemia 09/19/2015  . Atrial fibrillation, chronic   . Dyspnea on exertion 05/28/2015  . Acute respiratory failure with hypoxia (HCC) 04/01/2015  . Hypokalemia 04/01/2015  . Obesity hypoventilation syndrome (HCC) 08/08/2014  . GERD (gastroesophageal reflux disease) 08/08/2014  . Edema of right lower extremity 06/21/2014  . Fecal urgency 06/21/2014  . Asthma  02/11/2009  . Hyperlipidemia 07/12/2008  . OBESITY, MORBID 07/12/2008  . Anxiety state 07/12/2008  . DEPRESSION 07/12/2008  . Essential hypertension 07/12/2008  . ALLERGIC RHINITIS 07/12/2008  . Peptic ulcer 07/12/2008  . OSTEOARTHRITIS 07/12/2008    Past Surgical History:  Procedure Laterality Date  . CARDIAC CATHETERIZATION N/A 06/14/2015   Procedure: Right/Left Heart Cath and Coronary Angiography;  Surgeon: Runell Gess, MD;  Location: [facility] INVASIVE CV LAB;  Service: Cardiovascular;  Laterality: N/A;        Home Medications    Prior to Admission medications   Medication Sig Start Date End Date Taking? Authorizing Provider  albuterol (PROVENTIL) (2.5 MG/3ML) 0.083% nebulizer solution Take 2.5 mg by nebulization every 6 (six) hours as needed for wheezing or shortness of breath.    [provider]  atorvastatin (LIPITOR) 40 MG tablet Take 40 mg by mouth daily.  09/05/18   [provider]  carvedilol (COREG) 25 MG tablet Take 1 tablet (25 mg total) by mouth 2 (two) times daily with a meal for 30 days. Patient taking differently: Take 25 mg by mouth 2 (two) times a day.  08/18/18 10/24/18  Sherryll Burger, Pratik D, DO  docusate sodium (COLACE) 100 MG capsule Take 1 capsule (100 mg total) by mouth 2 (two) times daily. Patient taking differently: Take 100 mg by mouth daily as needed for mild constipation or moderate constipation.  06/25/16   Kathlen Mody, MD  ferrous gluconate (FERGON) 324 MG tablet Take 1 tablet (324 mg total) by mouth 2 (two) times daily with a meal. Patient taking differently: Take 324 mg by mouth daily.  06/25/16   Kathlen Mody, MD  fluticasone (FLOVENT HFA) 220 MCG/ACT inhaler Inhale 2 puffs into the lungs daily as needed (for shortness of breath).     [provider]  furosemide (LASIX) 40 MG tablet Take 40 mg by mouth 2 (two) times a day. Takes 80mg  with 40mg  for a total of 120mg  twice daily 10/11/18   [provider]  furosemide (LASIX) 80  MG tablet Take 1.5 tablets (120 mg total) by mouth 2 (two) times daily. Patient taking differently: Take 80 mg by mouth 2 (two) times daily.  10/07/18 01/05/19  Vassie Loll, MD  lisinopril (PRINIVIL,ZESTRIL) 5 MG tablet Take 1 tablet (5 mg total) by mouth daily. 09/03/18   Catarina Hartshorn, MD  OXYGEN Inhale 3.5 L into the lungs continuous.     [provider]  pantoprazole (PROTONIX) 40 MG tablet Take 1 tablet (40 mg total) by mouth daily at 6 (six) AM. Patient taking differently: Take 40 mg by mouth daily.  06/26/16   Kathlen Mody, MD  potassium chloride SA (K-DUR) 20 MEQ tablet Take 2 tablets (40 mEq total) by mouth 2 (two) times daily. 10/07/18   Vassie Loll, MD  rivaroxaban (XARELTO) 20 MG TABS tablet Take 1 tablet (20 mg total) by mouth daily with breakfast. Patient taking differently: Take 20 mg by mouth daily with supper.  06/26/16   Kathlen Mody, MD  spironolactone (ALDACTONE) 100 MG tablet Take 100 mg by mouth daily. For fluid 03/18/17   [provider]  tamsulosin (  FLOMAX) 0.4 MG CAPS capsule Take 1 capsule (0.4 mg total) by mouth daily. 06/25/16   Kathlen Mody, MD  potassium chloride 20 MEQ TBCR Take 20 mEq by mouth 2 (two) times daily. 09/03/15 09/03/15  Ward, Layla Maw, DO    Family History Family History  Problem Relation Age of Onset  . Stroke Father   . Heart attack Father   . Aneurysm Mother        Cerebral aneurysm  . Hypertension Sister   . Colon cancer Neg Hx   . Inflammatory bowel disease Neg Hx   . Liver disease Neg Hx     Social History Social History   Tobacco Use  . Smoking status: Former Smoker    Packs/day: 0.50    Years: 20.00    Pack years: 10.00    Types: Cigarettes    Start date: 04/26/1988    Last attempt to quit: 06/23/2007    Years since quitting: 11.3  . Smokeless tobacco: Never Used  . Tobacco comment: 1 ppd former smoker  Substance Use Topics  . Alcohol use: No    Alcohol/week: 0.0 standard drinks    Comment: No etoh since 2009   . Drug use: No     Allergies   Banana; Bee venom; Strawberry flavor; Aspirin; Metolazone; Oatmeal; Orange juice [orange oil]; Torsemide; Diltiazem; Hydralazine; and Lipitor [atorvastatin]   Review of Systems Review of Systems  Constitutional: Negative for appetite change and fatigue.  HENT: Negative for congestion, ear discharge and sinus pressure.   Eyes: Negative for discharge.  Respiratory: Positive for shortness of breath. Negative for cough.   Cardiovascular: Negative for chest pain.  Gastrointestinal: Negative for abdominal pain and diarrhea.  Genitourinary: Negative for frequency and hematuria.  Musculoskeletal: Negative for back pain.  Skin: Negative for rash.  Neurological: Negative for seizures and headaches.  Psychiatric/Behavioral: Negative for hallucinations.     Physical Exam Updated Vital Signs BP (!) 146/111 (BP Location: Left Arm)   Pulse 95   Temp 97.9 F (36.6 C) (Oral)   Resp 20   Ht 6' (1.829 m)   Wt (!) 176.1 kg   SpO2 100%   BMI 52.65 kg/m   Physical Exam Vitals signs and nursing note reviewed.  Constitutional:      Appearance: He is well-developed.  HENT:     Head: Normocephalic.     Nose: Nose normal.  Eyes:     General: No scleral icterus.    Conjunctiva/sclera: Conjunctivae normal.  Neck:     Musculoskeletal: Neck supple.     Thyroid: No thyromegaly.  Cardiovascular:     Rate and Rhythm: Normal rate and regular rhythm.     Heart sounds: No murmur. No friction rub. No gallop.   Pulmonary:     Breath sounds: No stridor. No wheezing or rales.  Chest:     Chest wall: No tenderness.  Abdominal:     General: There is no distension.     Tenderness: There is no abdominal tenderness. There is no rebound.  Musculoskeletal: Normal range of motion.     Comments: Significant edema all of his legs and in his abdomen  Lymphadenopathy:     Cervical: No cervical adenopathy.  Skin:    Findings: No erythema or rash.  Neurological:     Mental  Status: He is oriented to person, place, and time.     Motor: No abnormal muscle tone.     Coordination: Coordination normal.  Psychiatric:  Behavior: Behavior normal.      ED Treatments / Results  Labs (all labs ordered are listed, but only abnormal results are displayed) Labs Reviewed  CBC WITH DIFFERENTIAL/PLATELET - Abnormal; Notable for the following components:      Result Value   Hemoglobin 12.8 (*)    All other components within normal limits  SARS CORONAVIRUS 2 (HOSPITAL ORDER, PERFORMED IN [facility])  COMPREHENSIVE METABOLIC PANEL  BRAIN NATRIURETIC PEPTIDE  TROPONIN I    EKG None  Radiology Dg Chest Portable 1 View  Result Date: 10/30/2018 CLINICAL DATA:  Short of breath. EXAM: PORTABLE CHEST 1 VIEW COMPARISON:  10/24/2018 FINDINGS: Stable enlarged cardiac silhouette. Mild central venous congestion. No overt pulmonary edema. No pneumothorax. IMPRESSION: Cardiomegaly and central venous congestion. Electronically Signed   By: Genevive Bi M.D.   On: 10/30/2018 06:34    Procedures Procedures (including critical care time)  Medications Ordered in ED Medications  furosemide (LASIX) injection 80 mg (80 mg Intravenous Given 10/30/18 0620)     Initial Impression / Assessment and Plan / ED Course  I have reviewed the triage vital signs and the nursing notes.  Pertinent labs & imaging results that were available during my care of the patient were reviewed by me and considered in my medical decision making (see chart for details).        X-ray consistent with congestive heart failure..  Patient has anasarca and will be admitted to medicine  Final Clinical Impressions(s) / ED Diagnoses   Final diagnoses:  None    ED Discharge Orders    None       Bethann Berkshire, MD 10/30/18 [phone]

## 2018-10-30 NOTE — H&P (Signed)
History and Physical  [name] ZOX:[id number] DOB: 18-Jul-1965 DOA: 10/30/2018   PCP: Samuel Grippe, MD   Patient coming from: Home  Chief Complaint: dyspnea  HPI:  53 y/o male withwith medical history of history significant of sCHF (numerous admissions for uncompensated CHF but patient continues to be noncompliant with diet and medications outpatient and has been fired from numerous cardiology clinics secondary to no showing for appointments), atrial fibrillation (on xarelto), HTN, MI, asthma, OSA (no on CPAP or BiPap) presents with progressive leg swelling and sob since Fitzpatrick/c from his last admission 10/02/18-10/07/18. The patient presents again with 2 days of shortness of breath and worsening lower extremity edema as well as increasing abdominal girth and orthopnea. He denies any fevers, chills, headache, nausea, vomiting, diarrhea. He has a nonproductive cough which is chronic. He is chronically on 3 L nasal cannula.  He endorses compliance with his medications, but it is clear with [name] outreach documentation that he is noncompliant with meds and diet.  According to 10/28/18 [name] visit, "[name] admits to continuously consuming high sodium foods. He continues to decline referrals for nutritional counseling. Based on our conversation today, he does not intend to change his diet. He states, "it's not the food, it's the medicine." He prefers IV diuretics." In fact, the patient tell me he ate "2 spam sandwiches and french fries yesterday".  In addition, he frequently has told different providers different dosing of his home furosemide.  In fact, he recounts 2 different dosing regimens to me during the same conversation.  In the emergency department, the patient was afebrile and hemodynamically stable.  His oxygen saturation was 96% on 3 L.  BMP, CBC, and LFTs were unremarkable.  BNP was 263.  Chest x-ray showed mild venous congestion.  Patient was given furosemide 80 mg IV x1.    Assessment/Plan: Acute on chronic systolicand diastolicCHF -Hispriordry weight312 lbs on 02/08/17 -weight gain in part due to excess caloric intake -most recent "dry weight" approx 360 -2/27 discharge weight 364 lbs -10/07/18 discharge weight 358 lbs - start lasix to 80mg  IV BID -5/22/18Echo--EF 35-40 percent, diffuse HK, PSP 56, mild MR, +PFO -04/01/18 Echo EF 55%, no WMA - fluid restriction -reset dry weight--likely be weighing heavier now without fluid retention due to eating habits and sedentary lifestyle. - pt states he eats "french fries" regularly -continue spiro, coreg, IV lasix  Permanentatrial fibrillation - Continue xarelto - continuecoreg  Chronic respiratory Failure with hypoxia -chronically on 3-3.5L at home  HTN -continue coreg, spiro, lasix -improving with diuresis -lisinopril 5 mg added  GERD - continue protoninx  Hyperlipidemia -continue statin  Chronic lymphedema -pt noncompliant with therapies  CKD stage 2 -baseline creatinine 1.0-1.3 -monitor with diuresis  OSA -noncompliant with CPAP  Hypokalemia -replete   Morbid Obesity -BMI 52.65 -lifestyle modification         Past Medical History:  Diagnosis Date  . Allergic rhinitis   . Asthma   . Atrial fibrillation (HCC)   . CHF (congestive heart failure) (HCC)   . Essential hypertension   . Gastroesophageal reflux disease   . Heart attack (HCC)   . History of cardiac catheterization    Normal coronaries December 2016  . Noncompliance    Major problem leading to declining health and recurrent hospitalization  . Nonischemic cardiomyopathy (HCC)    a. LVEF 20-25% in 2017 with cath in 2016 showing normal cors. b. EF 55% by echo in 03/2018.  . On home  O2    3L N/C  . OSA (obstructive sleep apnea)   . Osteoarthritis   . Peptic ulcer disease    Past Surgical History:  Procedure Laterality Date  . CARDIAC CATHETERIZATION N/A 06/14/2015   Procedure:  Right/Left Heart Cath and Coronary Angiography;  Surgeon: Runell GessJonathan J Berry, MD;  [facility];  Service: Cardiovascular;  Laterality: N/A;   Social History:  reports that he quit smoking about 11 years ago. His smoking use included cigarettes. He started smoking about 30 years ago. He has a 10.00 pack-year smoking history. He has never used smokeless tobacco. He reports that he does not drink alcohol or use drugs.   Family History  Problem Relation Age of Onset  . Stroke Father   . Heart attack Father   . Aneurysm Mother        Cerebral aneurysm  . Hypertension Sister   . Colon cancer Neg Hx   . Inflammatory bowel disease Neg Hx   . Liver disease Neg Hx      Allergies  Allergen Reactions  . Banana Shortness Of Breath  . Bee Venom Shortness Of Breath, Swelling and Other (See Comments)    Reaction:  Facial swelling  . Strawberry Flavor Shortness Of Breath and Rash  . Aspirin Other (See Comments)    Reaction:  GI upset   . Metolazone Other (See Comments)    Pt states that he stopped taking this med due to heart attack like symptoms.   . Oatmeal Nausea And Vomiting  . Orange Juice [Orange Oil] Nausea And Vomiting    All acidic products make him nauseous and upset stomach  . Torsemide Swelling and Other (See Comments)    Reaction:  Swelling of feet/legs   . Diltiazem Palpitations  . Hydralazine Palpitations  . Lipitor [Atorvastatin] Other (See Comments)    Reaction:  Nose bleeds      Prior to Admission medications   Medication Sig Start Date End Date Taking? Authorizing Provider  albuterol (PROVENTIL) (2.5 MG/3ML) 0.083% nebulizer solution Take 2.5 mg by nebulization every 6 (six) hours as needed for wheezing or shortness of breath.    [provider]  atorvastatin (LIPITOR) 40 MG tablet Take 40 mg by mouth daily.  09/05/18   [provider]  carvedilol (COREG) 25 MG tablet Take 1 tablet (25 mg total) by mouth 2 (two) times daily with a meal for  30 days. Patient taking differently: Take 25 mg by mouth 2 (two) times a day.  08/18/18 10/24/18  Samuel BurgerShah, Samuel D, DO  docusate sodium (COLACE) 100 MG capsule Take 1 capsule (100 mg total) by mouth 2 (two) times daily. Patient taking differently: Take 100 mg by mouth daily as needed for mild constipation or moderate constipation.  06/25/16   Kathlen ModyAkula, Vijaya, MD  ferrous gluconate (FERGON) 324 MG tablet Take 1 tablet (324 mg total) by mouth 2 (two) times daily with a meal. Patient taking differently: Take 324 mg by mouth daily.  06/25/16   Kathlen ModyAkula, Vijaya, MD  fluticasone (FLOVENT HFA) 220 MCG/ACT inhaler Inhale 2 puffs into the lungs daily as needed (for shortness of breath).     [provider]  furosemide (LASIX) 40 MG tablet Take 40 mg by mouth 2 (two) times a day. Takes 80mg  with 40mg  for a total of 120mg  twice daily 10/11/18   [provider]  furosemide (LASIX) 80 MG tablet Take 1.5 tablets (120 mg total) by mouth 2 (two) times daily. Patient taking  differently: Take 80 mg by mouth 2 (two) times daily.  10/07/18 01/05/19  Vassie Loll, MD  lisinopril (PRINIVIL,ZESTRIL) 5 MG tablet Take 1 tablet (5 mg total) by mouth daily. 09/03/18   Catarina Hartshorn, MD  OXYGEN Inhale 3.5 L into the lungs continuous.     [provider]  pantoprazole (PROTONIX) 40 MG tablet Take 1 tablet (40 mg total) by mouth daily at 6 (six) AM. Patient taking differently: Take 40 mg by mouth daily.  06/26/16   Kathlen Mody, MD  potassium chloride SA (K-DUR) 20 MEQ tablet Take 2 tablets (40 mEq total) by mouth 2 (two) times daily. 10/07/18   Vassie Loll, MD  rivaroxaban (XARELTO) 20 MG TABS tablet Take 1 tablet (20 mg total) by mouth daily with breakfast. Patient taking differently: Take 20 mg by mouth daily with supper.  06/26/16   Kathlen Mody, MD  spironolactone (ALDACTONE) 100 MG tablet Take 100 mg by mouth daily. For fluid 03/18/17   [provider]  tamsulosin (FLOMAX) 0.4 MG CAPS capsule Take 1  capsule (0.4 mg total) by mouth daily. 06/25/16   Kathlen Mody, MD  potassium chloride 20 MEQ TBCR Take 20 mEq by mouth 2 (two) times daily. 09/03/15 09/03/15  Ward, Layla Maw, DO    Review of Systems:  Constitutional:  No weight loss, night sweats, Fevers, chills, fatigue.  Head&Eyes: No headache.  No vision loss.  No eye pain or scotoma ENT:  No Difficulty swallowing,Tooth/dental problems,Sore throat,  No ear ache, post nasal drip,  Cardio-vascular:  No dizziness, palpitations  GI:  No  abdominal pain, nausea, vomiting, diarrhea, loss of appetite, hematochezia, melena, heartburn, indigestion, Resp:   No coughing up of blood .No wheezing.No chest wall deformity  Skin:  no rash or lesions.  GU:  no dysuria, change in color of urine, no urgency or frequency. No flank pain.  Musculoskeletal:  No joint pain or swelling. No decreased range of motion. No back pain.  Psych:  No change in mood or affect. No depression or anxiety. Neurologic: No headache, no dysesthesia, no focal weakness, no vision loss. No syncope  Physical Exam: Vitals:   10/30/18 0645 10/30/18 0700 10/30/18 0730 10/30/18 0800  BP:  (!) 142/111 (!) 161/103 (!) 147/100  Pulse: (!) 107 66 (!) 48 (!) 47  Resp:  (!) Temp:      TempSrc:      SpO2: 95% 99% 97% 91%  Weight:      Height:       General:  A&O x 3, NAD, nontoxic, pleasant/cooperative Head/Eye: No conjunctival hemorrhage, no icterus, [place]/AT, No nystagmus ENT:  No icterus,  No thrush, good dentition, no pharyngeal exudate Neck:  No masses, no lymphadenpathy, no bruits CV:  IRRR, no rub, no gallop, no S3 Lung:  Bilateral crackles, no wheeze Abdomen: soft/NT, +BS, nondistended, no peritoneal signs Ext: No cyanosis, No rashes, No petechiae, No lymphangitis, 4+LE edema Neuro: CNII-XII intact, strength 4/5 in bilateral upper and lower extremities, no dysmetria  Labs on Admission:  Basic Metabolic Panel: Recent Labs  Lab 10/24/18 1217 10/30/18  0605  NA 141 141  K 3.2* 3.5  CL 107 103  CO2 26 29  GLUCOSE 107* 97  BUN 13 13  CREATININE 1.10 1.04  CALCIUM 8.5* 8.5*   Liver Function Tests: Recent Labs  Lab 10/30/18 0605  AST 18  ALT 15  ALKPHOS 61  BILITOT 0.9  PROT 7.0  ALBUMIN 3.5   No results for  input(s): LIPASE, AMYLASE in the last 168 hours. No results for input(s): AMMONIA in the last 168 hours. CBC: Recent Labs  Lab 10/24/18 1217 10/30/18 0605  WBC 4.8 4.2  NEUTROABS 3.2 2.7  HGB 12.5* 12.8*  HCT 40.0 40.3  MCV 88.7 88.2  PLT 166 167   Coagulation Profile: No results for input(s): INR, PROTIME in the last 168 hours. Cardiac Enzymes: Recent Labs  Lab 10/24/18 1217 10/30/18 0605  TROPONINI <0.03 0.03*   BNP: Invalid input(s): POCBNP CBG: No results for input(s): GLUCAP in the last 168 hours. Urine analysis:    Component Value Date/Time   COLORURINE YELLOW 04/01/2018 0332   APPEARANCEUR CLEAR 04/01/2018 0332   LABSPEC 1.008 04/01/2018 0332   PHURINE 7.0 04/01/2018 0332   GLUCOSEU NEGATIVE 04/01/2018 0332   GLUCOSEU NEG mg/dL 88/91/6945 0388   HGBUR NEGATIVE 04/01/2018 0332   BILIRUBINUR NEGATIVE 04/01/2018 0332   KETONESUR NEGATIVE 04/01/2018 0332   PROTEINUR NEGATIVE 04/01/2018 0332   UROBILINOGEN 0.2 08/08/2014 1445   NITRITE NEGATIVE 04/01/2018 0332   LEUKOCYTESUR NEGATIVE 04/01/2018 0332   Sepsis Labs: @LABRCNTIP (procalcitonin:4,lacticidven:4) ) Recent Results (from the past 240 hour(s))  SARS Coronavirus 2 (CEPHEID- Performed in [facility] lab), Hosp Order     Status: None   Collection Time: 10/30/18  6:05 AM  Result Value Ref Range Status   SARS Coronavirus 2 NEGATIVE NEGATIVE Final    Comment: (NOTE) If result is NEGATIVE SARS-CoV-2 target nucleic acids are NOT DETECTED. The SARS-CoV-2 RNA is generally detectable in upper and lower  respiratory specimens during the acute phase of infection. The lowest  concentration of SARS-CoV-2 viral copies this assay can  detect is 250  copies / mL. A negative result does not preclude SARS-CoV-2 infection  and should not be used as the sole basis for treatment or other  patient management decisions.  A negative result may occur with  improper specimen collection / handling, submission of specimen other  than nasopharyngeal swab, presence of viral mutation(s) within the  areas targeted by this assay, and inadequate number of viral copies  (<250 copies / mL). A negative result must be combined with clinical  observations, patient history, and epidemiological information. If result is POSITIVE SARS-CoV-2 target nucleic acids are DETECTED. The SARS-CoV-2 RNA is generally detectable in upper and lower  respiratory specimens dur ing the acute phase of infection.  Positive  results are indicative of active infection with SARS-CoV-2.  Clinical  correlation with patient history and other diagnostic information is  necessary to determine patient infection status.  Positive results do  not rule out bacterial infection or co-infection with other viruses. If result is PRESUMPTIVE POSTIVE SARS-CoV-2 nucleic acids MAY BE PRESENT.   A presumptive positive result was obtained on the submitted specimen  and confirmed on repeat testing.  While 2019 novel coronavirus  (SARS-CoV-2) nucleic acids may be present in the submitted sample  additional confirmatory testing may be necessary for epidemiological  and / or clinical management purposes  to differentiate between  SARS-CoV-2 and other Sarbecovirus currently known to infect humans.  If clinically indicated additional testing with an alternate test  methodology [id number]) is advised. The SARS-CoV-2 RNA is generally  detectable in upper and lower respiratory sp ecimens during the acute  phase of infection. The expected result is Negative. Fact Sheet for Patients:  BoilerBrush.com.cy Fact Sheet for Healthcare Providers:  https://pope.com/ This test is not yet approved or cleared by the [place] FDA and has been authorized for detection  and/or diagnosis of SARS-CoV-2 by FDA under an Emergency Use Authorization (EUA).  This EUA will remain in effect (meaning this test can be used) for the duration of the COVID-19 declaration under Section 564(b)(1) of the Act, 21 U.S.C. section 360bbb-3(b)(1), unless the authorization is terminated or revoked sooner. Performed at [facility], [address] [place], [place] [address]      Radiological Exams on Admission: Dg Chest Portable 1 View  Result Date: 10/30/2018 CLINICAL DATA:  Short of breath. EXAM: PORTABLE CHEST 1 VIEW COMPARISON:  10/24/2018 FINDINGS: Stable enlarged cardiac silhouette. Mild central venous congestion. No overt pulmonary edema. No pneumothorax. IMPRESSION: Cardiomegaly and central venous congestion. Electronically Signed   By: Genevive Bi M.Fitzpatrick.   On: 10/30/2018 06:34    EKG: Independently reviewed. Afib, nonspecific T wave changes    Time spent:60 minutes Code Status:   FULL Family Communication:  No Family at bedside Disposition Plan: expect 2-3 day hospitalization Consults called: none DVT Prophylaxis: xarelto  Catarina Hartshorn, DO  [facility] Pager [phone]  If 7PM-7AM, please contact night-coverage [email or web address] Password [facility] 10/30/2018, 8:31 AM

## 2018-10-30 NOTE — ED Triage Notes (Signed)
Pt C/O SOB that started days ago. Pt also C/O leg swelling and pain. Pt with Hx of CHF.

## 2018-10-31 ENCOUNTER — Other Ambulatory Visit: Payer: Self-pay | Admitting: Pharmacist

## 2018-10-31 ENCOUNTER — Ambulatory Visit: Payer: Self-pay | Admitting: Pharmacist

## 2018-10-31 LAB — BASIC METABOLIC PANEL
Anion gap: 7 (ref 5–15)
BUN: 14 mg/dL (ref 6–20)
CO2: 27 mmol/L (ref 22–32)
Calcium: 8.6 mg/dL — ABNORMAL LOW (ref 8.9–10.3)
Chloride: 108 mmol/L (ref 98–111)
Creatinine, Ser: 1.04 mg/dL (ref 0.61–1.24)
GFR calc Af Amer: >60 mL/min (ref >60)
GFR calc non Af Amer: >60 mL/min (ref >60)
Glucose, Bld: 89 mg/dL (ref 70–99)
Potassium: 3.7 mmol/L (ref 3.5–5.1)
Sodium: 142 mmol/L (ref 135–145)

## 2018-10-31 NOTE — Patient Outreach (Signed)
[facility]) Care Management  10/31/2018  LOPEZ OSTEN [birth year] [id number]   Patient's follow up call will be rescheduled as he is currently hospitalized.  From review of chart, patient was 387 pounds on 10/30/2018 when he presented to the ED. He was hospitalized and is currently being treated with IV diuretics.  Plan: Follow up with patient in 10-14 business days as he is being closely monitored by [facility] Community Nurse, Carloyn Manner, RN.  [name], PharmD, BCACP [facility] Clinical Pharmacist [phone]

## 2018-10-31 NOTE — Progress Notes (Signed)
PROGRESS NOTE  [name]:[id number] DOB: 1965/09/22 DOA: 10/30/2018 PCP: Pearson Grippe, MD  Brief History:  53 y/o male withwith medical history of history significant of sCHF (numerous admissions for uncompensated CHF but patient continues to be noncompliant with diet and medications outpatient and has been fired from numerous cardiology clinics secondary to no showing for appointments), atrial fibrillation (on xarelto), HTN, MI, asthma, OSA (no on CPAP or BiPap) presents with progressive leg swelling and sob since d/c from his last admission 10/02/18-10/07/18. The patient presents again with 2 days of shortness of breath and worsening lower extremity edema as well as increasing abdominal girth and orthopnea. He denies any fevers, chills, headache, nausea, vomiting, diarrhea. He has a nonproductive cough which is chronic. He is chronically on 3 L nasal cannula.  He endorses compliance with his medications, but it is clear with [facility] outreach documentation that he is noncompliant with meds and diet.  According to 10/28/18 [facility] visit, "Mr. Abruzzo admits to continuously consuming high sodium foods. He continues to decline referrals for nutritional counseling. Based on our conversation today, he does not intend to change his diet. He states, "it's not the food, it's the medicine." He prefers IV diuretics." In fact, the patient tell me he ate "2 spam sandwiches and french fries yesterday".  In addition, he frequently has told different providers different dosing of his home furosemide.  In fact, he recounts 2 different dosing regimens to me during the same conversation.  Assessment/Plan: Acute on chronic systolicand diastolicCHF -Hispriordry weight312 lbs on 02/08/17 -weight gain in part due to excess caloric intake -most recent "dry weight" approx 360 -2/27 discharge weight 364 lbs -10/07/18 discharge weight 358 lbs -10/31/18 weight 369 -continue lasix to  IV BID  -5/22/18Echo--EF 35-40 percent, diffuse HK, PSP 56, mild MR, +PFO -04/01/18 Echo EF 55%, no WMA - fluid restriction -reset dry weight--likely be weighing heavier now due to eating habits and sedentary lifestyle. - pt states he eats "french fries" regularly -continue spiro, coreg, IV lasix  Permanentatrial fibrillation - Continue xarelto - continuecoreg  Chronic respiratory Failure with hypoxia -chronically on 3-3.5L at home  HTN -continue coreg, spiro, lasix -improving with diuresis -lisinopril 5 mg added  GERD - continue protoninx  Hyperlipidemia -continue statin  Chronic lymphedema -pt noncompliant with therapies  CKD stage 2 -baseline creatinine 1.0-1.3 -monitor with diuresis  OSA -noncompliant with CPAP  Hypokalemia -replete  Morbid Obesity -BMI 52.65 -lifestyle modification      Disposition Plan:   Home in 1-2 days  Family Communication:   Family at bedside  Consultants:  none  Code Status:  FULL   DVT Prophylaxis:  xarelto   Procedures: As Listed in Progress Note Above  Antibiotics: None       Subjective: Pt is walking around making his bed without sob.  Denies cp, f/c, n/v/d, abd pain  Objective: Vitals:   10/31/18 0451 10/31/18 0510 10/31/18 0944 10/31/18 1404  BP:  123/76 120/81 103/77  Pulse:  62 84 69  Resp:  Temp:  98.6 F (37 C)  98 F (36.7 C)  TempSrc:  Oral  Oral  SpO2:  96% 95% 98%  Weight: (!) 168.6 kg     Height:        Intake/Output Summary (Last 24 hours) at 10/31/2018 1724 Last data filed at 10/31/2018 1400 Gross per 24 hour  Intake 480 ml  Output 4575 ml  Net -  4095 ml   Weight change: -7.5 kg Exam:   General:  Pt is alert, follows commands appropriately, not in acute distress  HEENT: No icterus, No thrush, No neck mass, /AT  Cardiovascular: RRR, S1/S2, no rubs, no gallops  Respiratory: bibasilar crackles  Abdomen: Soft/+BS, non tender, non distended, no  guarding  Extremities: 3 + LE edema, No lymphangitis, No petechiae, No rashes, no synovitis   Data Reviewed: I have personally reviewed following labs and imaging studies Basic Metabolic Panel: Recent Labs  Lab 10/30/18 0605 10/31/18 0608  NA 141 142  K 3.5 3.7  CL 103 108  CO2 29 27  GLUCOSE 97 89  BUN 13 14  CREATININE 1.04 1.04  CALCIUM 8.5* 8.6*   Liver Function Tests: Recent Labs  Lab 10/30/18 0605  AST 18  ALT 15  ALKPHOS 61  BILITOT 0.9  PROT 7.0  ALBUMIN 3.5   No results for input(s): LIPASE, AMYLASE in the last 168 hours. No results for input(s): AMMONIA in the last 168 hours. Coagulation Profile: No results for input(s): INR, PROTIME in the last 168 hours. CBC: Recent Labs  Lab 10/30/18 0605  WBC 4.2  NEUTROABS 2.7  HGB 12.8*  HCT 40.3  MCV 88.2  PLT 167   Cardiac Enzymes: Recent Labs  Lab 10/30/18 0605 10/30/18 1036 10/30/18 1546  TROPONINI 0.03* 0.03* 0.03*   BNP: Invalid input(s): POCBNP CBG: No results for input(s): GLUCAP in the last 168 hours. HbA1C: No results for input(s): HGBA1C in the last 72 hours. Urine analysis:    Component Value Date/Time   COLORURINE YELLOW 04/01/2018 0332   APPEARANCEUR CLEAR 04/01/2018 0332   LABSPEC 1.008 04/01/2018 0332   PHURINE 7.0 04/01/2018 0332   GLUCOSEU NEGATIVE 04/01/2018 0332   GLUCOSEU NEG mg/dL 87/86/7672 0947   HGBUR NEGATIVE 04/01/2018 0332   BILIRUBINUR NEGATIVE 04/01/2018 0332   KETONESUR NEGATIVE 04/01/2018 0332   PROTEINUR NEGATIVE 04/01/2018 0332   UROBILINOGEN 0.2 08/08/2014 1445   NITRITE NEGATIVE 04/01/2018 0332   LEUKOCYTESUR NEGATIVE 04/01/2018 0332   Sepsis Labs: @LABRCNTIP (procalcitonin:4,lacticidven:4) ) Recent Results (from the past 240 hour(s))  SARS Coronavirus 2 (CEPHEID- Performed in [facility] hospital lab), Hosp Order     Status: None   Collection Time: 10/30/18  6:05 AM  Result Value Ref Range Status   SARS Coronavirus 2 NEGATIVE NEGATIVE Final     Comment: (NOTE) If result is NEGATIVE SARS-CoV-2 target nucleic acids are NOT DETECTED. The SARS-CoV-2 RNA is generally detectable in upper and lower  respiratory specimens during the acute phase of infection. The lowest  concentration of SARS-CoV-2 viral copies this assay can detect is 250  copies / mL. A negative result does not preclude SARS-CoV-2 infection  and should not be used as the sole basis for treatment or other  patient management decisions.  A negative result may occur with  improper specimen collection / handling, submission of specimen other  than nasopharyngeal swab, presence of viral mutation(s) within the  areas targeted by this assay, and inadequate number of viral copies  (<250 copies / mL). A negative result must be combined with clinical  observations, patient history, and epidemiological information. If result is POSITIVE SARS-CoV-2 target nucleic acids are DETECTED. The SARS-CoV-2 RNA is generally detectable in upper and lower  respiratory specimens dur ing the acute phase of infection.  Positive  results are indicative of active infection with SARS-CoV-2.  Clinical  correlation with patient history and other diagnostic information is  necessary to determine patient  infection status.  Positive results do  not rule out bacterial infection or co-infection with other viruses. If result is PRESUMPTIVE POSTIVE SARS-CoV-2 nucleic acids MAY BE PRESENT.   A presumptive positive result was obtained on the submitted specimen  and confirmed on repeat testing.  While 2019 novel coronavirus  (SARS-CoV-2) nucleic acids may be present in the submitted sample  additional confirmatory testing may be necessary for epidemiological  and / or clinical management purposes  to differentiate between  SARS-CoV-2 and other Sarbecovirus currently known to infect humans.  If clinically indicated additional testing with an alternate test  methodology [phone]) is advised. The SARS-CoV-2  RNA is generally  detectable in upper and lower respiratory sp ecimens during the acute  phase of infection. The expected result is Negative. Fact Sheet for Patients:  BoilerBrush.com.cy Fact Sheet for Healthcare Providers: https://pope.com/ This test is not yet approved or cleared by the [place] FDA and has been authorized for detection and/or diagnosis of SARS-CoV-2 by FDA under an Emergency Use Authorization (EUA).  This EUA will remain in effect (meaning this test can be used) for the duration of the COVID-19 declaration under Section 564(b)(1) of the Act, 21 U.S.C. section 360bbb-3(b)(1), unless the authorization is terminated or revoked sooner. Performed at [facility], [address] [place], [place] [address]      Scheduled Meds: . atorvastatin  40 mg Oral Daily  . carvedilol  12.5 mg Oral BID WC  . feeding supplement (ENSURE ENLIVE)  237 mL Oral BID BM  . ferrous gluconate  324 mg Oral BID WC  . furosemide  80 mg Intravenous BID  . lisinopril  5 mg Oral Daily  . potassium chloride SA  40 mEq Oral BID  . rivaroxaban  20 mg Oral Q supper  . sodium chloride flush  3 mL Intravenous Q12H  . spironolactone  25 mg Oral Daily  . tamsulosin  0.4 mg Oral Daily   Continuous Infusions: . sodium chloride      Procedures/Studies: Dg Chest 2 View  Result Date: 10/24/2018 CLINICAL DATA:  Shortness of breath. EXAM: CHEST - 2 VIEW COMPARISON:  Radiograph of October 02, 2018. FINDINGS: Stable cardiomegaly with central pulmonary vascular congestion. No pneumothorax or pleural effusion is noted. No acute pulmonary disease is noted. Bony thorax is unremarkable. IMPRESSION: Stable cardiomegaly with central pulmonary vascular congestion Electronically Signed   By: Lupita Raider M.D.   On: 10/24/2018 13:15   Dg Chest Portable 1 View  Result Date: 10/30/2018 CLINICAL DATA:  Short of breath. EXAM: PORTABLE CHEST 1 VIEW COMPARISON:   10/24/2018 FINDINGS: Stable enlarged cardiac silhouette. Mild central venous congestion. No overt pulmonary edema. No pneumothorax. IMPRESSION: Cardiomegaly and central venous congestion. Electronically Signed   By: Genevive Bi M.D.   On: 10/30/2018 06:34   Dg Chest Portable 1 View  Result Date: 10/02/2018 CLINICAL DATA:  Dyspnea, lower extremity edema EXAM: PORTABLE CHEST 1 VIEW COMPARISON:  09/18/2018 chest radiograph. FINDINGS: Stable cardiomediastinal silhouette with moderate cardiomegaly. No pneumothorax. No pleural effusion. Mild pulmonary edema. IMPRESSION: Mild congestive heart failure. Electronically Signed   By: Delbert Phenix M.D.   On: 10/02/2018 11:30    Catarina Hartshorn, DO  [facility] Pager [phone]  If 7PM-7AM, please contact night-coverage [email or web address] Password [email or web address] 10/31/2018, 5:24 PM   LOS: 1 day

## 2018-10-31 NOTE — Progress Notes (Signed)
Initial Nutrition Assessment  DOCUMENTATION CODES:   Morbid obesity  INTERVENTION:  Nutrition education provided: as describe below  No additional follow up recommended until pt is ready to make lifestyle changes  NUTRITION DIAGNOSIS:   Limited adherence to nutrition-related recommendations related to (pt willfully disregards recommendations of low sodium diet as part of managment of CHF) as evidenced by per patient/family report and return visits to hospital due to acute on chronic heart failure.   GOAL:  (Patient will begin to adapt a low sodium diet by complying during hosopitaliztion)  MONITOR:   Weight trends, PO intake, I & O's  REASON FOR ASSESSMENT:   Consult Diet education (CHF)- hx of dietary non compliance   ASSESSMENT: Patient is a morbidly obese 53 yo male with multiple admissions related to his chronic chf and documented dietary non compliance. He present with acute on chronic CHF exacerbation and RD has been consulted to provide education review.   RD met with patient to review Nutrition Therapy guidelines/recommendations related to his acute on chronic chf. He readily reports usual intake of a number of very high sodium foods including pizza, vienna sausages, french fries etc. Attempted to discuss label reading and offered alternate low sodium food choices.  The patient is adamant that his meds are wrong and no one will listen. He is ruminating on this one thing and either refuses or is unable to embrace the truth that HIS FOOD choices are impacting the exacerbation of this disease. Handouts provided in addition to verbal education "Nutrition Therapy for Heart Failure and Weight Loss Tips".   Expect poor compliance - a very important barrier for this patient is his refusal to accept responsibility for altering nutrition intake despite a number of attempts to educate him regarding dietary, management strategies.   He appears unwilling to embrace potential ongoing  poor health outcomes related to his lack of willingness to alter his eating pattern or food choices.  Medications reviewed and include: lasix, potassium, lipitor, zofran   Labs: BMP Latest Ref Rng & Units 10/31/2018 10/30/2018 10/24/2018  Glucose 70 - 99 mg/dL 89 97 107(H)  BUN 6 - 20 mg/dL 14 13 13   Creatinine 0.61 - 1.24 mg/dL 1.04 1.04 1.10  Sodium 135 - 145 mmol/L 142 141 141  Potassium 3.5 - 5.1 mmol/L 3.7 3.5 3.2(L)  Chloride 98 - 111 mmol/L 108 103 107  CO2 22 - 32 mmol/L 27 29 26   Calcium 8.9 - 10.3 mg/dL 8.6(L) 8.5(L) 8.5(L)      Intake/Output Summary (Last 24 hours) at 10/31/2018 1511 Last data filed at 10/31/2018 1400 Gross per 24 hour  Intake 720 ml  Output 6075 ml  Net -5355 ml      Diet Order:   Diet Order            Diet Heart Room service appropriate? Yes; Fluid consistency: Thin  Diet effective now              EDUCATION NEEDS:   Education needs have been addressed Skin:  Skin Assessment: Reviewed RN Assessment  Last BM:  unknown  Height:   Ht Readings from Last 1 Encounters:  10/30/18 6' (1.829 m)    Weight:   Wt Readings from Last 1 Encounters:  10/31/18 (!) 168.6 kg    Ideal Body Weight:  81 kg  BMI:  Body mass index is 50.41 kg/m.  Estimated Nutritional Needs:   Kcal:  1900-2100 to promote wt loss  Protein:  120-130 gr/kg/bw  Fluid:  <  2 liters daily   Colman Cater MS,RD,CSG,LDN Office: [phone] Pager: [phone]

## 2018-10-31 NOTE — TOC Initial Note (Signed)
Transition of Care [facility]) - Initial/Assessment Note    Patient Details  Name: [name] MRN: [id number] Date of Birth: September 18, 1965  Transition of Care [facility]) CM/SW Contact:    Ida Rogue, LCSW Phone Number: 10/31/2018, 12:12 PM  Clinical Narrative:    [name] lives at home with his mother, who does the grocery shopping, and his 2 off spring.  He states he has a scales and reports his weight regularly to [facility] nurse.  He also states that he tries to adhere to a low sodium diet, though also states that when he tried to follow it religiously in the past, "it didn't make any difference."  He also states that he takes his meds as prescribed, but they don't work as well as coming to the hospital and taking our meds.  "It's just not the same."  Unable to identify anything that he will do differently when he returns home.  Denies any needs other than continuing with [facility].   CSW to contact nurse.                 Expected Discharge Plan: Home/Self Care Barriers to Discharge: No Barriers Identified   Patient Goals and CMS Choice Patient states their goals for this hospitalization and ongoing recovery are:: Get this good medication that works to get off the fluid  Expected Discharge Plan and Services Expected Discharge Plan: Home/Self Care Discharge Planning Services: Other - See comment([facility] patient) Living arrangements for the past 2 months: Single Family Home Expected Discharge Date: 10/05/18                         Prior Living Arrangements/Services Living arrangements for the past 2 months: Single Family Home Lives with:: Adult Children, Parents Patient language and need for interpreter reviewed:: Yes Do you feel safe going back to the place where you live?: Yes      Need for Family Participation in Patient Care: No (Comment) Care giver support system in place?: No (comment) Criminal Activity/Legal Involvement Pertinent to Current Situation/Hospitalization: No - Comment as  needed  Activities of Daily Living Home Assistive Devices/Equipment: Oxygen, Hospital bed ADL Screening (condition at time of admission) Patient's cognitive ability adequate to safely complete daily activities?: Yes Is the patient deaf or have difficulty hearing?: No Does the patient have difficulty seeing, even when wearing glasses/contacts?: No Does the patient have difficulty concentrating, remembering, or making decisions?: No Patient able to express need for assistance with ADLs?: Yes Does the patient have difficulty dressing or bathing?: No Independently performs ADLs?: Yes (appropriate for developmental age) Does the patient have difficulty walking or climbing stairs?: Yes Weakness of Legs: Both Weakness of Arms/Hands: None  Permission Sought/Granted                                                          Emotional Assessment Appearance:: Appears stated age Attitude/Demeanor/Rapport: Engaged Affect (typically observed): Pleasant Orientation: : Oriented to Self, Oriented to Situation, Oriented to Place, Oriented to  Time Alcohol / Substance Use: Not Applicable Psych Involvement: No (comment)  Admission diagnosis:  Acute on chronic combined systolic and diastolic CHF (congestive heart failure) (HCC) [I50.43]     Patient Active Problem List   Diagnosis Date Noted  . Acute on chronic combined systolic and  diastolic CHF (congestive heart failure) (HCC) 10/02/2018  . AKI (acute kidney injury) (HCC) 09/18/2018  . Acute on chronic combined systolic and diastolic congestive heart failure (HCC) 08/15/2018  . SOB (shortness of breath) 08/03/2018  . Volume overload 07/22/2018  . Acute systolic CHF (congestive heart failure) (HCC) 07/04/2018  . Acute on chronic combined systolic and diastolic heart failure (HCC) 04/01/2018  . Hypomagnesemia 04/01/2018  . Acute on chronic diastolic CHF (congestive heart failure) (HCC) 03/11/2018  . Pressure injury of skin 03/11/2018  . CHF  (congestive heart failure) (HCC) 09/08/2017  . Altered mental status 05/11/2017  . Acute exacerbation of CHF (congestive heart failure) (HCC) 04/20/2017  . Acute on chronic systolic (congestive) heart failure (HCC) 04/19/2017  . CHF exacerbation (HCC) 04/11/2017  . Chronic respiratory failure with hypoxia (HCC) 04/11/2017  . Encounter for hospice care discussion   . Palliative care encounter   . Goals of care, counseling/discussion   . DNR (do not resuscitate)   . DNR (do not resuscitate) discussion   . Chronic congestive heart failure (HCC) 02/18/2017  . Acute systolic heart failure (HCC) 01/18/2017  . Pedal edema 12/02/2016  . Acute CHF (congestive heart failure) (HCC) 11/09/2016  . Acute on chronic systolic CHF (congestive heart failure) (HCC) 08/19/2016  . CKD (chronic kidney disease), stage II 05/17/2016  . Coronary arteries, normal Dec 2016 01/23/2016  . Chronic anticoagulation-Xarelto 01/23/2016  . NSVT (nonsustained ventricular tachycardia) (HCC) 01/23/2016  . Elevated troponin 12/21/2015  . Nonischemic cardiomyopathy (HCC)   . Morbid obesity due to excess calories (HCC)   . Noncompliance with diet and medication regimen 12/02/2015  . OSA (obstructive sleep apnea) 09/20/2015  . Pulmonary hypertension (HCC) 09/19/2015  . Normocytic anemia 09/19/2015  . Atrial fibrillation, chronic   . Dyspnea on exertion 05/28/2015  . Acute respiratory failure with hypoxia (HCC) 04/01/2015  . Hypokalemia 04/01/2015  . Obesity hypoventilation syndrome (HCC) 08/08/2014  . GERD (gastroesophageal reflux disease) 08/08/2014  . Edema of right lower extremity 06/21/2014  . Fecal urgency 06/21/2014  . Asthma 02/11/2009  . Hyperlipidemia 07/12/2008  . OBESITY, MORBID 07/12/2008  . Anxiety state 07/12/2008  . DEPRESSION 07/12/2008  . Essential hypertension 07/12/2008  . ALLERGIC RHINITIS 07/12/2008  . Peptic ulcer 07/12/2008  . OSTEOARTHRITIS 07/12/2008   PCP:  Pearson Grippe,  MD Pharmacy:   [facility] [phone] - [place], [place] - [address] [facility] [place] [address] Phone: [phone] Fax: [phone]     Social Determinants of Health (SDOH) Interventions  Readmission Risk Interventions No flowsheet data found.

## 2018-11-01 ENCOUNTER — Ambulatory Visit: Payer: Self-pay | Admitting: Pharmacist

## 2018-11-01 ENCOUNTER — Ambulatory Visit: Payer: Self-pay

## 2018-11-01 LAB — BASIC METABOLIC PANEL
Anion gap: 9 (ref 5–15)
BUN: 18 mg/dL (ref 6–20)
CO2: 26 mmol/L (ref 22–32)
Calcium: 8.6 mg/dL — ABNORMAL LOW (ref 8.9–10.3)
Chloride: 105 mmol/L (ref 98–111)
Creatinine, Ser: 1.14 mg/dL (ref 0.61–1.24)
GFR calc Af Amer: >60 mL/min (ref >60)
GFR calc non Af Amer: >60 mL/min (ref >60)
Glucose, Bld: 88 mg/dL (ref 70–99)
Potassium: 3.7 mmol/L (ref 3.5–5.1)
Sodium: 140 mmol/L (ref 135–145)

## 2018-11-01 NOTE — Plan of Care (Signed)
Patient is progressing with care plan. 

## 2018-11-01 NOTE — Progress Notes (Signed)
PROGRESS NOTE  [name] GKK:[id number] DOB: [birth year] DOA: 10/30/2018 PCP: Pearson Grippe, MD  Brief History:  53 y/o male withwith medical history of history significant of sCHF (numerous admissions for uncompensated CHF but patient continues to be noncompliant with diet and medications outpatient and has been fired from numerous cardiology clinics secondary to no showing for appointments), atrial fibrillation (on xarelto), HTN, MI, asthma, OSA (no on CPAP or BiPap) presents with progressive leg swelling and sob since d/c from his last admission 10/02/18-10/07/18. The patient presents again with 2 days of shortness of breath and worsening lower extremity edema as well as increasing abdominal girthand orthopnea. He denies any fevers, chills, headache, nausea, vomiting, diarrhea. He has a nonproductive cough which is chronic. He is chronically on 3 L nasal cannula. He endorses compliance with his medications, but it is clear with [facility] outreach documentation that he is noncompliant with meds and diet. According to 10/28/18 [facility] visit, "Mr. Eshbach admits to continuously consuming high sodium foods. He continues to decline referrals for nutritional counseling. Based on our conversation today, he does not intend to change his diet. He states, "it's not the food, it's the medicine."He prefers IV diuretics." In fact, the patient tell me he ate "2 spam sandwiches and french fries yesterday". In addition, he frequently has told different providers different dosing of his home furosemide. In fact, he recounts 2 different dosing regimens to me during the same conversation.  Assessment/Plan: Acute on chronic systolicand diastolicCHF -Hispriordry weight312 lbs on 02/08/17 -weight gain in part due to excess caloric intake -most recent "dry weight" approx 360 -2/27 discharge weight 364 lbs -10/07/18 discharge weight 358 lbs -11/01/18 weight 365 (NEG 11 lbs) -continuelasix to80mg  IV BID  -5/22/18Echo--EF 35-40 percent, diffuse HK, PSP 56, mild MR, +PFO -04/01/18 Echo EF 55%, no WMA - fluid restriction -reset dry weight--likely be weighing heavier now due to eating habits and sedentary lifestyle. - pt states he eats "french fries" regularly -continue spiro, coreg, IV lasix  Permanentatrial fibrillation - Continue xarelto - continuecoreg  Chronic respiratory Failure with hypoxia -chronically on 3-3.5L at home  HTN -continue coreg, spiro, lasix -improving with diuresis -lisinopril 5 mg added  GERD - continue protoninx  Hyperlipidemia -continue statin  Chronic lymphedema -pt noncompliant with therapies  CKD stage 2 -baseline creatinine 1.0-1.3 -monitor with diuresis  OSA -noncompliant with CPAP  Hypokalemia -replete  Morbid Obesity -BMI 52.65 -lifestyle modification      Disposition Plan:   Home 5/13 or 5/14 Family Communication:  No Family at bedside  Consultants:  none  Code Status:  FULL   DVT Prophylaxis:  xarelto   Procedures: As Listed in Progress Note Above  Antibiotics: None     Subjective: Patient denies fevers, chills, headache, chest pain, dyspnea, nausea, vomiting, diarrhea, abdominal pain, dysuria, hematuria, hematochezia, and melena.   Objective: Vitals:   11/01/18 0521 11/01/18 0524 11/01/18 0805 11/01/18 1553  BP: (!) 157/102 (!) 142/97  110/74  Pulse: 85 77  79  Resp: (!) 24   20  Temp: 98.5 F (36.9 C)   97.7 F (36.5 C)  TempSrc: Oral   Oral  SpO2: 94%  94% 95%  Weight: (!) 165.7 kg     Height:        Intake/Output Summary (Last 24 hours) at 11/01/2018 1802 Last data filed at 11/01/2018 1554 Gross per 24 hour  Intake 820 ml  Output 4650 ml  Net -3830 ml  Weight change: -2.901 kg Exam:   General:  Pt is alert, follows commands appropriately, not in acute distress  HEENT: No icterus, No thrush, No neck mass, [place]/AT  Cardiovascular: RRR, S1/S2, no rubs, no  gallops  Respiratory: CTA bilaterally, no wheezing, no crackles, no rhonchi  Abdomen: Soft/+BS, non tender, non distended, no guarding  Extremities: 2 + LE edema, No lymphangitis, No petechiae, No rashes, no synovitis   Data Reviewed: I have personally reviewed following labs and imaging studies Basic Metabolic Panel: Recent Labs  Lab 10/30/18 0605 10/31/18 0608 11/01/18 0600  NA 141 142 140  K 3.5 3.7 3.7  CL 103 108 105  CO2 29 27 26   GLUCOSE 97 89 88  BUN 13 14 18   CREATININE 1.04 1.04 1.14  CALCIUM 8.5* 8.6* 8.6*   Liver Function Tests: Recent Labs  Lab 10/30/18 0605  AST 18  ALT 15  ALKPHOS 61  BILITOT 0.9  PROT 7.0  ALBUMIN 3.5   No results for input(s): LIPASE, AMYLASE in the last 168 hours. No results for input(s): AMMONIA in the last 168 hours. Coagulation Profile: No results for input(s): INR, PROTIME in the last 168 hours. CBC: Recent Labs  Lab 10/30/18 0605  WBC 4.2  NEUTROABS 2.7  HGB 12.8*  HCT 40.3  MCV 88.2  PLT 167   Cardiac Enzymes: Recent Labs  Lab 10/30/18 0605 10/30/18 1036 10/30/18 1546  TROPONINI 0.03* 0.03* 0.03*   BNP: Invalid input(s): POCBNP CBG: No results for input(s): GLUCAP in the last 168 hours. HbA1C: No results for input(s): HGBA1C in the last 72 hours. Urine analysis:    Component Value Date/Time   COLORURINE YELLOW 04/01/2018 0332   APPEARANCEUR CLEAR 04/01/2018 0332   LABSPEC 1.008 04/01/2018 0332   PHURINE 7.0 04/01/2018 0332   GLUCOSEU NEGATIVE 04/01/2018 0332   GLUCOSEU NEG mg/dL 96/04/540908/10/2009 81192254   HGBUR NEGATIVE 04/01/2018 0332   BILIRUBINUR NEGATIVE 04/01/2018 0332   KETONESUR NEGATIVE 04/01/2018 0332   PROTEINUR NEGATIVE 04/01/2018 0332   UROBILINOGEN 0.2 08/08/2014 1445   NITRITE NEGATIVE 04/01/2018 0332   LEUKOCYTESUR NEGATIVE 04/01/2018 0332   Sepsis Labs: @LABRCNTIP (procalcitonin:4,lacticidven:4) ) Recent Results (from the past 240 hour(s))  SARS Coronavirus 2 (CEPHEID- [facility]formed [facility]  [facility]), Hosp Order     Status: None   Collection Time: 10/30/18  6:05 AM  Result Value Ref Range Status   SARS Coronavirus 2 NEGATIVE NEGATIVE Final    Comment: (NOTE) If result is NEGATIVE SARS-CoV-2 target nucleic acids are NOT DETECTED. The SARS-CoV-2 RNA is generally detectable in upper and lower  respiratory specimens during the acute phase of infection. The lowest  concentration of SARS-CoV-2 viral copies this assay can detect is 250  copies / mL. A negative result does not preclude SARS-CoV-2 infection  and should not be used as the sole basis for treatment or other  patient management decisions.  A negative result may occur with  improper specimen collection / handling, submission of specimen other  than nasopharyngeal swab, presence of viral mutation(s) within the  areas targeted by this assay, and inadequate number of viral copies  (<250 copies / mL). A negative result must be combined with clinical  observations, patient history, and epidemiological information. If result is POSITIVE SARS-CoV-2 target nucleic acids are DETECTED. The SARS-CoV-2 RNA is generally detectable in upper and lower  respiratory specimens dur ing the acute phase of infection.  Positive  results are indicative of active infection with SARS-CoV-2.  Clinical  correlation with  patient history and other diagnostic information is  necessary to determine patient infection status.  Positive results do  not rule out bacterial infection or co-infection with other viruses. If result is PRESUMPTIVE POSTIVE SARS-CoV-2 nucleic acids MAY BE PRESENT.   A presumptive positive result was obtained on the submitted specimen  and confirmed on repeat testing.  While 2019 novel coronavirus  (SARS-CoV-2) nucleic acids may be present in the submitted sample  additional confirmatory testing may be necessary for epidemiological  and / or clinical management purposes  to differentiate between  SARS-CoV-2  and other Sarbecovirus currently known to infect humans.  If clinically indicated additional testing with an alternate test  methodology [phone]) is advised. The SARS-CoV-2 RNA is generally  detectable in upper and lower respiratory sp ecimens during the acute  phase of infection. The expected result is Negative. Fact Sheet for Patients:  BoilerBrush.com.cy Fact Sheet for Healthcare Providers: https://pope.com/ This test is not yet approved or cleared by the [place] FDA and has been authorized for detection and/or diagnosis of SARS-CoV-2 by FDA under an Emergency Use Authorization (EUA).  This EUA will remain in effect (meaning this test can be used) for the duration of the COVID-19 declaration under Section 564(b)(1) of the Act, 21 U.S.C. section 360bbb-3(b)(1), unless the authorization is terminated or revoked sooner. Performed at [facility], [address], [place], [place] [address]      Scheduled Meds: . atorvastatin  40 mg Oral Daily  . carvedilol  12.5 mg Oral BID WC  . feeding supplement (ENSURE ENLIVE)  237 mL Oral BID BM  . ferrous gluconate  324 mg Oral BID WC  . furosemide  80 mg Intravenous BID  . lisinopril  5 mg Oral Daily  . potassium chloride SA  40 mEq Oral BID  . rivaroxaban  20 mg Oral Q supper  . sodium chloride flush  3 mL Intravenous Q12H  . spironolactone  25 mg Oral Daily  . tamsulosin  0.4 mg Oral Daily   Continuous Infusions: . sodium chloride      Procedures/Studies: Dg Chest 2 View  Result Date: 10/24/2018 CLINICAL DATA:  Shortness of breath. EXAM: CHEST - 2 VIEW COMPARISON:  Radiograph of October 02, 2018. FINDINGS: Stable cardiomegaly with central pulmonary vascular congestion. No pneumothorax or pleural effusion is noted. No acute pulmonary disease is noted. Bony thorax is unremarkable. IMPRESSION: Stable cardiomegaly with central pulmonary vascular congestion Electronically Signed   By: Lupita Raider M.D.   On: 10/24/2018 13:15   Dg Chest Portable 1 View  Result Date: 10/30/2018 CLINICAL DATA:  Short of breath. EXAM: PORTABLE CHEST 1 VIEW COMPARISON:  10/24/2018 FINDINGS: Stable enlarged cardiac silhouette. Mild central venous congestion. No overt pulmonary edema. No pneumothorax. IMPRESSION: Cardiomegaly and central venous congestion. Electronically Signed   By: Genevive Bi M.D.   On: 10/30/2018 06:34    Catarina Hartshorn, DO  [facility] Pager [phone]  If 7PM-7AM, please contact night-coverage [email or web address] Password [email or web address] 11/01/2018, 6:02 PM   LOS: 2 days

## 2018-11-02 DIAGNOSIS — E876 Hypokalemia: Secondary | ICD-10-CM

## 2018-11-02 LAB — BASIC METABOLIC PANEL
Anion gap: 11 (ref 5–15)
BUN: 21 mg/dL — ABNORMAL HIGH (ref 6–20)
CO2: 24 mmol/L (ref 22–32)
Calcium: 8.9 mg/dL (ref 8.9–10.3)
Chloride: 106 mmol/L (ref 98–111)
Creatinine, Ser: 1.12 mg/dL (ref 0.61–1.24)
GFR calc Af Amer: >60 mL/min (ref >60)
GFR calc non Af Amer: >60 mL/min (ref >60)
Glucose, Bld: 85 mg/dL (ref 70–99)
Potassium: 3.9 mmol/L (ref 3.5–5.1)
Sodium: 141 mmol/L (ref 135–145)

## 2018-11-02 MED ORDER — ACETAMINOPHEN 325 MG PO TABS: 650.0000 mg | ORAL_TABLET | ORAL | Status: AC | PRN

## 2018-11-02 MED ORDER — LISINOPRIL 5 MG PO TABS: 10.0000 mg | ORAL_TABLET | Freq: Every day | ORAL | 0 refills | Status: DC

## 2018-11-02 NOTE — TOC Transition Note (Signed)
Transition of Care [facility]) - CM/SW Discharge Note   Patient Details  Name: CHEZ CHANNER MRN: [id number] Date of Birth: 07-05-1965  Transition of Care [facility]) CM/SW Contact:  Annice Needy, LCSW Phone Number: 11/02/2018, 1:42 PM   Clinical Narrative:    Patient will discharge home. He has a history of being medically noncompliant with foods and medications.  Patient will likely be readmitted with the same situation as this is his history.    Final next level of care: Home w Home Health Services Barriers to Discharge: No Barriers Identified   Patient Goals and CMS Choice Patient states their goals for this hospitalization and ongoing recovery are:: To return home.       Discharge Placement                       Discharge Plan and Services                                     Social Determinants of Health (SDOH) Interventions     Readmission Risk Interventions Readmission Risk Prevention Plan 10/07/2018  Transportation Screening Complete  Medication Review (RN Care Manager) Complete  PCP or Specialist appointment within 3-5 days of discharge Complete  SW Recovery Care/Counseling Consult Complete  Palliative Care Screening Not Applicable  Skilled Nursing Facility Not Applicable  Some recent data might be hidden

## 2018-11-02 NOTE — Discharge Summary (Addendum)
Physician Discharge Summary  Samuel KlippelMark T Fitzpatrick ZOX:[id number] DOB: 04/04/1966 DOA: 10/30/2018  PCP: Pearson GrippeKim, [name], MD  Admit date: 10/30/2018 Discharge date: 11/02/2018  Admitted From: home  Disposition:  home   Recommendations for Outpatient Follow-up:  1. Continue to encourage compliance with treatment       Discharge Condition:  stable   CODE STATUS:  Full code   Diet recommendation:  Heart healthy, low sodium Consultations:  none    Discharge Diagnoses:  Principal Problem:   Acute on chronic combined systolic and diastolic congestive heart failure (HCC) Active Problems: Chronic respiratory failure-  Obesity hypoventilation syndrome   OBESITY, MORBID   Essential hypertension   Hypokalemia   Pulmonary hypertension (HCC)   Noncompliance with diet and medication regimen   CKD (chronic kidney disease), stage II     HPI (Dr Tat) 53 y/o male withwith medical history of history significant of sCHF (numerous admissions for uncompensated CHF but patient continues to be noncompliant with diet and medications outpatient and has been fired from numerous cardiology clinics secondary to no showing for appointments), atrial fibrillation (on xarelto), HTN, MI, asthma, OSA (no on CPAP or BiPap) presents with progressive leg swelling and sob since d/c from his last admission 10/02/18-10/07/18. The patient presents again with 2 days of shortness of breath and worsening lower extremity edema as well as increasing abdominal girthand orthopnea. He denies any fevers, chills, headache, nausea, vomiting, diarrhea. He has a nonproductive cough which is chronic. He is chronically on 3 L nasal cannula. He endorses compliance with his medications, but it is [facility] that he is noncompliant with meds and diet. [facility], "Mr. Christell Faithrcher admits to continuously consuming high sodium foods. He continues to decline referrals for nutritional counseling. Based on our  conversation today, he does not intend to change his diet. He states, "it's not the food, it's the medicine."He prefers IV diuretics." In fact, the patient tell me he ate "2 spam sandwiches and french fries yesterday". In addition, he frequently has told different providers different dosing of his home furosemide. In fact, he recounts 2 different dosing regimens to me during the same conversation.   Hospital Course:  Acute on Chronic CHF with HTN - suspected to be recurrent issue due to noncompliance with diet - he has been diuresed down to a weight of 163.7 lab - Lisinopril added  Mildly elevated Troponin - Troponin 0.03 x 3 sets- likely due to above cardiac strain  Chronic A-fib - cont Coreg and Xarelto  Chronic respiratory failure/ OHS - on 3- 3.5 L at baseline  OSA  - not compliant with CPAP  Chronic lymphedema - as mentioned, non- compliant with treatment  Morbid Obesity -BMI 52.65 -lifestyle modification  Discharge Exam: Vitals:   11/01/18 2106 11/02/18 0516  BP: 124/62 (!) 148/91  Pulse: 60 82  Resp: (!) 24 20  Temp: 98.4 F (36.9 C) 97.8 F (36.6 C)  SpO2: 97% 100%   Vitals:   11/01/18 1553 11/01/18 1950 11/01/18 2106 11/02/18 0516  BP: 110/74  124/62 (!) 148/91  Pulse: 79  60 82  Resp: 20  (!) 24 20  Temp: 97.7 F (36.5 C)  98.4 F (36.9 C) 97.8 F (36.6 C)  TempSrc: Oral  Oral Oral  SpO2: 95% 93% 97% 100%  Weight:    (!) 163.7 kg  Height:        General: Pt is alert, awake, not in acute distress Cardiovascular: RRR, S1/S2 +, no  rubs, no gallops Respiratory: CTA bilaterally, no wheezing, no rhonchi Abdominal: Soft, NT, ND, bowel sounds + Extremities: no edema, no cyanosis   Discharge Instructions  Discharge Instructions    Diet - low sodium heart healthy   Complete by:  As directed    Increase activity slowly   Complete by:  As directed      Allergies as of 11/02/2018      Reactions   Banana Shortness Of Breath   Bee Venom  Shortness Of Breath, Swelling, Other (See Comments)   Reaction:  Facial swelling   Strawberry Flavor Shortness Of Breath, Rash   Aspirin Other (See Comments)   Reaction:  GI upset    Metolazone Other (See Comments)   Pt states that he stopped taking this med due to heart attack like symptoms.    Oatmeal Nausea And Vomiting   Orange Juice [orange Oil] Nausea And Vomiting   All acidic products make him nauseous and upset stomach   Torsemide Swelling, Other (See Comments)   Reaction:  Swelling of feet/legs    Diltiazem Palpitations   Hydralazine Palpitations   Lipitor [atorvastatin] Other (See Comments)   Reaction:  Nose bleeds       Medication List    TAKE these medications   acetaminophen 325 MG tablet Commonly known as:  TYLENOL Take 2 tablets (650 mg total) by mouth every 4 (four) hours as needed for headache or mild pain.   albuterol (2.5 MG/3ML) 0.083% nebulizer solution Commonly known as:  PROVENTIL Take 2.5 mg by nebulization every 6 (six) hours as needed for wheezing or shortness of breath.   atorvastatin 40 MG tablet Commonly known as:  LIPITOR Take 40 mg by mouth daily.   bumetanide 2 MG tablet Commonly known as:  BUMEX Take 2 mg by mouth 2 (two) times daily.   carvedilol 25 MG tablet Commonly known as:  COREG Take 1 tablet (25 mg total) by mouth 2 (two) times daily with a meal for 30 days. What changed:  when to take this   docusate sodium 100 MG capsule Commonly known as:  COLACE Take 1 capsule (100 mg total) by mouth 2 (two) times daily. What changed:    when to take this  reasons to take this   ferrous gluconate 324 MG tablet Commonly known as:  FERGON Take 1 tablet (324 mg total) by mouth 2 (two) times daily with a meal. What changed:  when to take this   fluticasone 220 MCG/ACT inhaler Commonly known as:  FLOVENT HFA Inhale 2 puffs into the lungs daily as needed (for shortness of breath).   furosemide 40 MG tablet Commonly known as:   LASIX Take 40 mg by mouth 2 (two) times daily.   furosemide 80 MG tablet Commonly known as:  LASIX Take 1.5 tablets (120 mg total) by mouth 2 (two) times daily.   lisinopril 5 MG tablet Commonly known as:  ZESTRIL Take 2 tablets (10 mg total) by mouth daily. What changed:    how much to take  Another medication with the same name was removed. Continue taking this medication, and follow the directions you see here.   OXYGEN Inhale 3.5 L into the lungs continuous.   pantoprazole 40 MG tablet Commonly known as:  PROTONIX Take 1 tablet (40 mg total) by mouth daily at 6 (six) AM. What changed:  when to take this   potassium chloride SA 20 MEQ tablet Commonly known as:  K-DUR Take 2 tablets (40 mEq total)  by mouth 2 (two) times daily.   rivaroxaban 20 MG Tabs tablet Commonly known as:  XARELTO Take 1 tablet (20 mg total) by mouth daily with breakfast. What changed:  when to take this   spironolactone 100 MG tablet Commonly known as:  ALDACTONE Take 100 mg by mouth daily. For fluid   tamsulosin 0.4 MG Caps capsule Commonly known as:  FLOMAX Take 1 capsule (0.4 mg total) by mouth daily.      Follow-up Information    Pearson Grippe, MD Follow up in 6 day(s).   Specialty:  Internal Medicine Why:  Tuesday at 10:30 a.m. for in-office appointment.  Contact information: [address] [place] [address] [phone]        [name], MD .   Specialty:  Cardiology Contact information: [address] [place] [address] [phone]          Allergies  Allergen Reactions  . Banana Shortness Of Breath  . Bee Venom Shortness Of Breath, Swelling and Other (See Comments)    Reaction:  Facial swelling  . Strawberry Flavor Shortness Of Breath and Rash  . Aspirin Other (See Comments)    Reaction:  GI upset   . Metolazone Other (See Comments)    Pt states that he stopped taking this med due to heart attack like symptoms.   . Oatmeal Nausea  And Vomiting  . Orange Juice [Orange Oil] Nausea And Vomiting    All acidic products make him nauseous and upset stomach  . Torsemide Swelling and Other (See Comments)    Reaction:  Swelling of feet/legs   . Diltiazem Palpitations  . Hydralazine Palpitations  . Lipitor [Atorvastatin] Other (See Comments)    Reaction:  Nose bleeds      Procedures/Studies:    Dg Chest 2 View  Result Date: 10/24/2018 CLINICAL DATA:  Shortness of breath. EXAM: CHEST - 2 VIEW COMPARISON:  Radiograph of October 02, 2018. FINDINGS: Stable cardiomegaly with central pulmonary vascular congestion. No pneumothorax or pleural effusion is noted. No acute pulmonary disease is noted. Bony thorax is unremarkable. IMPRESSION: Stable cardiomegaly with central pulmonary vascular congestion Electronically Signed   By: Lupita Raider M.D.   On: 10/24/2018 13:15   Dg Chest Portable 1 View  Result Date: 10/30/2018 CLINICAL DATA:  Short of breath. EXAM: PORTABLE CHEST 1 VIEW COMPARISON:  10/24/2018 FINDINGS: Stable enlarged cardiac silhouette. Mild central venous congestion. No overt pulmonary edema. No pneumothorax. IMPRESSION: Cardiomegaly and central venous congestion. Electronically Signed   By: Genevive Bi M.D.   On: 10/30/2018 06:34     The results of significant diagnostics from this hospitalization (including imaging, microbiology, ancillary and laboratory) are listed below for reference.     Microbiology: Recent Results (from the past 240 hour(s))  SARS Coronavirus 2 (CEPHEID- Performed in [facility] hospital lab), Hosp Order     Status: None   Collection Time: 10/30/18  6:05 AM  Result Value Ref Range Status   SARS Coronavirus 2 NEGATIVE NEGATIVE Final    Comment: (NOTE) If result is NEGATIVE SARS-CoV-2 target nucleic acids are NOT DETECTED. The SARS-CoV-2 RNA is generally detectable in upper and lower  respiratory specimens during the acute phase of infection. The lowest  concentration of SARS-CoV-2  viral copies this assay can detect is 250  copies / mL. A negative result does not preclude SARS-CoV-2 infection  and should not be used as the sole basis for treatment or other  patient management decisions.  A negative result may occur with  improper specimen collection / handling, submission of specimen other  than nasopharyngeal swab, presence of viral mutation(s) within the  areas targeted by this assay, and inadequate number of viral copies  (<250 copies / mL). A negative result must be combined with clinical  observations, patient history, and epidemiological information. If result is POSITIVE SARS-CoV-2 target nucleic acids are DETECTED. The SARS-CoV-2 RNA is generally detectable in upper and lower  respiratory specimens dur ing the acute phase of infection.  Positive  results are indicative of active infection with SARS-CoV-2.  Clinical  correlation with patient history and other diagnostic information is  necessary to determine patient infection status.  Positive results do  not rule out bacterial infection or co-infection with other viruses. If result is PRESUMPTIVE POSTIVE SARS-CoV-2 nucleic acids MAY BE PRESENT.   A presumptive positive result was obtained on the submitted specimen  and confirmed on repeat testing.  While 2019 novel coronavirus  (SARS-CoV-2) nucleic acids may be present in the submitted sample  additional confirmatory testing may be necessary for epidemiological  and / or clinical management purposes  to differentiate between  SARS-CoV-2 and other Sarbecovirus currently known to infect humans.  If clinically indicated additional testing with an alternate test  methodology [phone]) is advised. The SARS-CoV-2 RNA is generally  detectable in upper and lower respiratory sp ecimens during the acute  phase of infection. The expected result is Negative. Fact Sheet for Patients:  BoilerBrush.com.cy Fact Sheet for Healthcare  Providers: https://pope.com/ This test is not yet approved or cleared by the [place] FDA and has been authorized for detection and/or diagnosis of SARS-CoV-2 by FDA under an Emergency Use Authorization (EUA).  This EUA will remain in effect (meaning this test can be used) for the duration of the COVID-19 declaration under Section 564(b)(1) of the Act, 21 U.S.C. section 360bbb-3(b)(1), unless the authorization is terminated or revoked sooner. Performed at [facility], [address] [place], [place] [address]      Labs: BNP (last 3 results) Recent Labs    10/02/18 1034 10/24/18 1217 10/30/18 0605  BNP 328.0* 275.0* 263.0*   Basic Metabolic Panel: Recent Labs  Lab 10/30/18 0605 10/31/18 0608 11/01/18 0600 11/02/18 0455  NA 141 142 140 141  K 3.5 3.7 3.7 3.9  CL 103 108 105 106  CO2 GLUCOSE 97 89 88 85  BUN 21*  CREATININE 1.04 1.04 1.14 1.12  CALCIUM 8.5* 8.6* 8.6* 8.9   Liver Function Tests: Recent Labs  Lab 10/30/18 0605  AST 18  ALT 15  ALKPHOS 61  BILITOT 0.9  PROT 7.0  ALBUMIN 3.5   No results for input(s): LIPASE, AMYLASE in the last 168 hours. No results for input(s): AMMONIA in the last 168 hours. CBC: Recent Labs  Lab 10/30/18 0605  WBC 4.2  NEUTROABS 2.7  HGB 12.8*  HCT 40.3  MCV 88.2  PLT 167   Cardiac Enzymes: Recent Labs  Lab 10/30/18 0605 10/30/18 1036 10/30/18 1546  TROPONINI 0.03* 0.03* 0.03*   BNP: Invalid input(s): POCBNP CBG: No results for input(s): GLUCAP in the last 168 hours. D-Dimer No results for input(s): DDIMER in the last 72 hours. Hgb A1c No results for input(s): HGBA1C in the last 72 hours. Lipid Profile No results for input(s): CHOL, HDL, LDLCALC, TRIG, CHOLHDL, LDLDIRECT in the last 72 hours. Thyroid function studies No results for input(s): TSH, T4TOTAL, T3FREE, THYROIDAB in the last  72 hours.  Invalid input(s): FREET3 Anemia work up No results for  input(s): VITAMINB12, FOLATE, FERRITIN, TIBC, IRON, RETICCTPCT in the last 72 hours. Urinalysis    Component Value Date/Time   COLORURINE YELLOW 04/01/2018 0332   APPEARANCEUR CLEAR 04/01/2018 0332   LABSPEC 1.008 04/01/2018 0332   PHURINE 7.0 04/01/2018 0332   GLUCOSEU NEGATIVE 04/01/2018 0332   GLUCOSEU NEG mg/dL 56/21/3086 5784   HGBUR NEGATIVE 04/01/2018 0332   BILIRUBINUR NEGATIVE 04/01/2018 0332   KETONESUR NEGATIVE 04/01/2018 0332   PROTEINUR NEGATIVE 04/01/2018 0332   UROBILINOGEN 0.2 08/08/2014 1445   NITRITE NEGATIVE 04/01/2018 0332   LEUKOCYTESUR NEGATIVE 04/01/2018 0332   Sepsis Labs Invalid input(s): PROCALCITONIN,  WBC,  LACTICIDVEN Microbiology Recent Results (from the past 240 hour(s))  SARS Coronavirus 2 (CEPHEID- Performed in [facility] lab), Hosp Order     Status: None   Collection Time: 10/30/18  6:05 AM  Result Value Ref Range Status   SARS Coronavirus 2 NEGATIVE NEGATIVE Final    Comment: (NOTE) If result is NEGATIVE SARS-CoV-2 target nucleic acids are NOT DETECTED. The SARS-CoV-2 RNA is generally detectable in upper and lower  respiratory specimens during the acute phase of infection. The lowest  concentration of SARS-CoV-2 viral copies this assay can detect is 250  copies / mL. A negative result does not preclude SARS-CoV-2 infection  and should not be used as the sole basis for treatment or other  patient management decisions.  A negative result may occur with  improper specimen collection / handling, submission of specimen other  than nasopharyngeal swab, presence of viral mutation(s) within the  areas targeted by this assay, and inadequate number of viral copies  (<250 copies / mL). A negative result must be combined with clinical  observations, patient history, and epidemiological information. If result is POSITIVE SARS-CoV-2 target nucleic acids are DETECTED. The SARS-CoV-2 RNA is generally detectable in upper and lower  respiratory  specimens dur ing the acute phase of infection.  Positive  results are indicative of active infection with SARS-CoV-2.  Clinical  correlation with patient history and other diagnostic information is  necessary to determine patient infection status.  Positive results do  not rule out bacterial infection or co-infection with other viruses. If result is PRESUMPTIVE POSTIVE SARS-CoV-2 nucleic acids MAY BE PRESENT.   A presumptive positive result was obtained on the submitted specimen  and confirmed on repeat testing.  While 2019 novel coronavirus  (SARS-CoV-2) nucleic acids may be present in the submitted sample  additional confirmatory testing may be necessary for epidemiological  and / or clinical management purposes  to differentiate between  SARS-CoV-2 and other Sarbecovirus currently known to infect humans.  If clinically indicated additional testing with an alternate test  methodology [phone]) is advised. The SARS-CoV-2 RNA is generally  detectable in upper and lower respiratory sp ecimens during the acute  phase of infection. The expected result is Negative. Fact Sheet for Patients:  BoilerBrush.com.cy Fact Sheet for Healthcare Providers: https://pope.com/ This test is not yet approved or cleared by the [place] FDA and has been authorized for detection and/or diagnosis of SARS-CoV-2 by FDA under an Emergency Use Authorization (EUA).  This EUA will remain in effect (meaning this test can be used) for the duration of the COVID-19 declaration under Section 564(b)(1) of the Act, 21 U.S.C. section 360bbb-3(b)(1), unless the authorization is terminated or revoked sooner. Performed at [facility], [address] [place], [place] [address]      Time coordinating  discharge in minutes: 65  SIGNED:   Calvert Cantor, MD  [facility] 11/02/2018, 1:56 PM Pager   If 7PM-7AM, please contact  night-coverage [email or web address] Password [email or web address]

## 2018-11-02 NOTE — Plan of Care (Signed)
Patient is progressing with care plan. Expected to discharge today.

## 2018-11-04 ENCOUNTER — Encounter: Payer: Self-pay | Admitting: Physician Assistant

## 2018-11-04 ENCOUNTER — Telehealth (INDEPENDENT_AMBULATORY_CARE_PROVIDER_SITE_OTHER): Payer: Medicare Other | Admitting: Physician Assistant

## 2018-11-04 ENCOUNTER — Ambulatory Visit: Payer: Self-pay

## 2018-11-04 ENCOUNTER — Other Ambulatory Visit: Payer: Self-pay

## 2018-11-04 VITALS — BP 149/95 | Ht 72.0 in | Wt 379.0 lb

## 2018-11-04 DIAGNOSIS — I5033 Acute on chronic diastolic (congestive) heart failure: Secondary | ICD-10-CM | POA: Diagnosis not present

## 2018-11-04 DIAGNOSIS — Z91119 Patient's noncompliance with dietary regimen due to unspecified reason: Secondary | ICD-10-CM

## 2018-11-04 DIAGNOSIS — Z9111 Patient's noncompliance with dietary regimen: Secondary | ICD-10-CM

## 2018-11-04 NOTE — Progress Notes (Signed)
Virtual Visit via Telephone Note   This visit type was conducted due to national recommendations for restrictions regarding the COVID-19 Pandemic (e.g. social distancing) in an effort to limit this patient's exposure and mitigate transmission in our community.  Due to his co-morbid illnesses, this patient is at least at moderate risk for complications without adequate follow up.  This format is felt to be most appropriate for this patient at this time.  The patient did not have access to video technology/had technical difficulties with video requiring transitioning to audio format only (telephone).  All issues noted in this document were discussed and addressed.  No physical exam could be performed with this format.  Please refer to the patient's chart for his  consent to telehealth for [facility].  Evaluation Performed:  Follow-up visit  This visit type was conducted due to national recommendations for restrictions regarding the COVID-19 Pandemic (e.g. social distancing).  This format is felt to be most appropriate for this patient at this time.  All issues noted in this document were discussed and addressed.  No physical exam was performed (except for noted visual exam findings with Video Visits).  Please refer to the patient's chart (MyChart message for video visits and phone note for telephone visits) for the patient's consent to telehealth [facility].  Date:  11/04/2018   ID:  [name], DOB 02/24/1966, MRN [id number]  Patient Location:  Home  Provider location:   Off site  PCP:  Pearson GrippeKim, [name], MD  Cardiologist:  Prentice DockerSuresh Koneswaran, MD 10/17/2018 Electrophysiologist:  None   Chief Complaint:  Hospital follow up  History of Present Illness:    [name] is a 53 y.o. male who presents via audio/video conferencing for a telehealth visit today.    53 y.o. yo male who has a hx of S-D-CHF w/ EF 25% 2016>>55% 03/2018, non-compliance, Afib, HTN, OSA, OA, chronic  hypoxia on home O2  04/27 Phone visit, wt 380 (up 22 lbs), continue Lasix 120 mg bid, spiro 100 mg 05/04 ER visit for weight gain, SOB, got 2 doses of IV Lasix and discharged home, wt 388 lbs Admit 5/10- 11/02/2018 for CHF exacerbation, d/c wt 360 pounds, down 28 lbs  His weight today was 379 lbs.   He is taking Lasix 80 mg bid and taking 40 mg in the middle of the day.   His most recent D/C summary listed Bumex 2 mg bid, Lasix 40 mg bid, Lasix 80 mg 1.5 tabs bid and spironolactone 100 mg qd.   He does not know the sodium in the foods he eats and does not know how much liquids he drinks in a day.  Extensive discussion regarding the effectiveness the medications.  He believes that he gains weight because the medications he is taking are generic.  He feels that even if he was compliant with a low-sodium diet and even if he was compliant with fluid restrictions, he would still gain weight very rapidly.  The patient does not have symptoms concerning for COVID-19 infection (fever, chills, cough, or new shortness of breath).    Prior CV studies:   The following studies were reviewed today:  ECHO: 04/01/2018 - Left ventricle: The cavity size was normal. Wall thickness was   increased in a pattern of moderate LVH. The estimated ejection   fraction was 55%. Wall motion was normal; there were no regional   wall motion abnormalities. The study was not technically  sufficient to allow evaluation of LV diastolic dysfunction due to   atrial fibrillation. - Aortic valve: Mildly calcified annulus. Trileaflet. - Mitral valve: Mildly thickened leaflets. There was trivial   regurgitation. - Left atrium: The atrium was moderately to severely dilated. - Right atrium: Central venous pressure (est): 3 mm Hg. - Tricuspid valve: There was trivial regurgitation. - Pulmonary arteries: Systolic pressure could not be accurately   estimated. - Pericardium, extracardiac: A trivial pericardial effusion was    identified.  Past Medical History:  Diagnosis Date  . Allergic rhinitis   . Asthma   . Atrial fibrillation (HCC)   . CHF (congestive heart failure) (HCC)   . Elevated troponin level not due myocardial infarction 06/13/2015   cath w/ nl cors, felt 2nd HTN, CHF, peat trop 2.73  . Essential hypertension   . Gastroesophageal reflux disease   . History of cardiac catheterization    Normal coronaries December 2016  . Noncompliance    Major problem leading to declining health and recurrent hospitalization  . Nonischemic cardiomyopathy (HCC)    a. LVEF 20-25% in 2017 with cath in 2016 showing normal cors. b. EF 55% by echo in 03/2018.  . On home O2    3L N/C  . OSA (obstructive sleep apnea)   . Osteoarthritis   . Peptic ulcer disease    Past Surgical History:  Procedure Laterality Date  . CARDIAC CATHETERIZATION N/A 06/14/2015   Procedure: Right/Left Heart Cath and Coronary Angiography;  Surgeon: Runell Gess, MD;  Location: [facility] INVASIVE CV LAB;  Service: Cardiovascular;  Laterality: N/A;     Current Meds  Medication Sig  . acetaminophen (TYLENOL) 325 MG tablet Take 2 tablets (650 mg total) by mouth every 4 (four) hours as needed for headache or mild pain.  Marland Kitchen albuterol (PROVENTIL) (2.5 MG/3ML) 0.083% nebulizer solution Take 2.5 mg by nebulization every 6 (six) hours as needed for wheezing or shortness of breath.  Marland Kitchen atorvastatin (LIPITOR) 40 MG tablet Take 40 mg by mouth daily.   . bumetanide (BUMEX) 2 MG tablet Take 2 mg by mouth 2 (two) times daily.  . carvedilol (COREG) 25 MG tablet Take 1 tablet (25 mg total) by mouth 2 (two) times daily with a meal for 30 days. (Patient taking differently: Take 25 mg by mouth 2 (two) times a day. )  . docusate sodium (COLACE) 100 MG capsule Take 1 capsule (100 mg total) by mouth 2 (two) times daily. (Patient taking differently: Take 100 mg by mouth daily as needed for mild constipation or moderate constipation. )  . ferrous gluconate (FERGON) 324  MG tablet Take 1 tablet (324 mg total) by mouth 2 (two) times daily with a meal. (Patient taking differently: Take 324 mg by mouth daily. )  . fluticasone (FLOVENT HFA) 220 MCG/ACT inhaler Inhale 2 puffs into the lungs daily as needed (for shortness of breath).   . furosemide (LASIX) 40 MG tablet Take 40 mg by mouth 2 (two) times daily.  . furosemide (LASIX) 80 MG tablet Take 1.5 tablets (120 mg total) by mouth 2 (two) times daily.  Marland Kitchen lisinopril (ZESTRIL) 5 MG tablet Take 2 tablets (10 mg total) by mouth daily.  . OXYGEN Inhale 3.5 L into the lungs continuous.   . pantoprazole (PROTONIX) 40 MG tablet Take 1 tablet (40 mg total) by mouth daily at 6 (six) AM. (Patient taking differently: Take 40 mg by mouth daily. )  . potassium chloride SA (K-DUR) 20 MEQ tablet Take 2 tablets (  40 mEq total) by mouth 2 (two) times daily.  . rivaroxaban (XARELTO) 20 MG TABS tablet Take 1 tablet (20 mg total) by mouth daily with breakfast. (Patient taking differently: Take 20 mg by mouth daily with supper. )  . spironolactone (ALDACTONE) 100 MG tablet Take 100 mg by mouth daily. For fluid  . tamsulosin (FLOMAX) 0.4 MG CAPS capsule Take 1 capsule (0.4 mg total) by mouth daily.  . [DISCONTINUED] potassium chloride 20 MEQ TBCR Take 20 mEq by mouth 2 (two) times daily.     Allergies:   Banana; Bee venom; Strawberry flavor; Aspirin; Metolazone; Oatmeal; Orange juice [orange oil]; Torsemide; Diltiazem; Hydralazine; and Lipitor [atorvastatin]   Social History   Tobacco Use  . Smoking status: Former Smoker    Packs/day: 0.50    Years: 20.00    Pack years: 10.00    Types: Cigarettes    Start date: 04/26/1988    Last attempt to quit: 06/23/2007    Years since quitting: 11.3  . Smokeless tobacco: Never Used  . Tobacco comment: 1 ppd former smoker  Substance Use Topics  . Alcohol use: No    Alcohol/week: 0.0 standard drinks    Comment: No etoh since 2009  . Drug use: No     Family Hx: The patient's family history  includes Aneurysm in his mother; Heart attack in his father; Hypertension in his sister; Stroke in his father. There is no history of Colon cancer, Inflammatory bowel disease, or Liver disease.  ROS:   Please see the history of present illness.    All other systems reviewed and are negative.   Labs/Other Tests and Data Reviewed:    Recent Labs: 10/05/2018: Magnesium 2.2 10/30/2018: ALT 15; B Natriuretic Peptide 263.0; Hemoglobin 12.8; Platelets 167 11/02/2018: BUN 21; Creatinine, Ser 1.12; Potassium 3.9; Sodium 141   Recent Lipid Panel Lab Results  Component Value Date/Time   CHOL 139 06/14/2015 03:28 AM   TRIG 58 06/14/2015 03:28 AM   HDL 28 (L) 06/14/2015 03:28 AM   CHOLHDL 5.0 06/14/2015 03:28 AM   LDLCALC 99 06/14/2015 03:28 AM    Wt Readings from Last 3 Encounters:  11/04/18 (!) 379 lb (171.9 kg)  11/02/18 (!) 360 lb 14.3 oz (163.7 kg)  10/24/18 (!) 388 lb 2 oz (176.1 kg)     Objective:    Vital Signs:  BP (!) 149/95   Ht 6' (1.829 m)   Wt (!) 379 lb (171.9 kg)   BMI 51.40 kg/m    53 y.o. male in no acute distress. However, he gets very SOB w/ minimal exertion.   ASSESSMENT & PLAN:    1.  Chronic Diastolic CHF:  -Patient firmly believes that the weight gain is not related to dietary intake, but because the meds are generic. - Extensive discussion with him explaining that when he is in the hospital, he is getting generic furosemide, but it is IV and because it does not go through the stomach, it works better. - This explanation did not make any difference in his viewpoint. - Fortunately, he is not taking all of the diuretics prescribed on his last discharge summary - Lasix 80 mg twice daily with a middle dose of 40 mg is not enough. - On his H&P from his most recent admission, he was supposed to be taking 1-1/2 tablets of the 80 mg Lasix and 1 tablet of the 40 mg Lasix twice a day.  That was not adequate to keep him from gaining weight. - Reviewed  the medications  with Dr. Purvis Sheffield.  Will try Bumex 2 mg 3 times daily.  Continue spironolactone and potassium as he is - He is at extremely high risk for readmission very soon.  COVID-19 Education: The signs and symptoms of COVID-19 were discussed with the patient and how to seek care for testing (follow up with PCP or arrange E-visit).  The importance of social distancing was discussed today.  Patient Risk:   After full review of this patient's clinical status, I feel that they are at least moderate risk at this time.  Time:   Today, I have spent 50 minutes with the patient with telehealth technology discussing cardiac and other health issues.     Medication Adjustments/Labs and Tests Ordered: Current medicines are reviewed at length with the patient today.  Concerns regarding medicines are outlined above.  Tests Ordered: No orders of the defined types were placed in this encounter.  Medication Changes: No orders of the defined types were placed in this encounter.   Disposition:  Follow up with Dr Purvis Sheffield  Signed, Theodore Demark, PA-C  11/04/2018 4:43 PM    [facility]

## 2018-11-04 NOTE — Patient Outreach (Signed)
[facility]) Care Management  11/04/2018  [name] 08/17/1965 [id number]   Brief outreach. Spoke with [name] mother, [name]. Reports that he's doing well. He left a message reporting a weight of 379 lbs. Per recent Cardiology outreach, his medications were adjusted. Will follow up next week to discuss compliance and dosing schedule.  PLAN Will update [facility] Pharmacist. Will follow up with [name] next week.   Katha Cabal [facility] Community Care Manager [phone]

## 2018-11-06 IMAGING — DX DG CHEST 2V
2 series · 2 of 2 positions shown · non-contrast
Comparison: 02/18/2017

CLINICAL DATA: 50-year-old male with a history of lower extremity
edema

EXAM:
CHEST  2 VIEW

[chest pa]
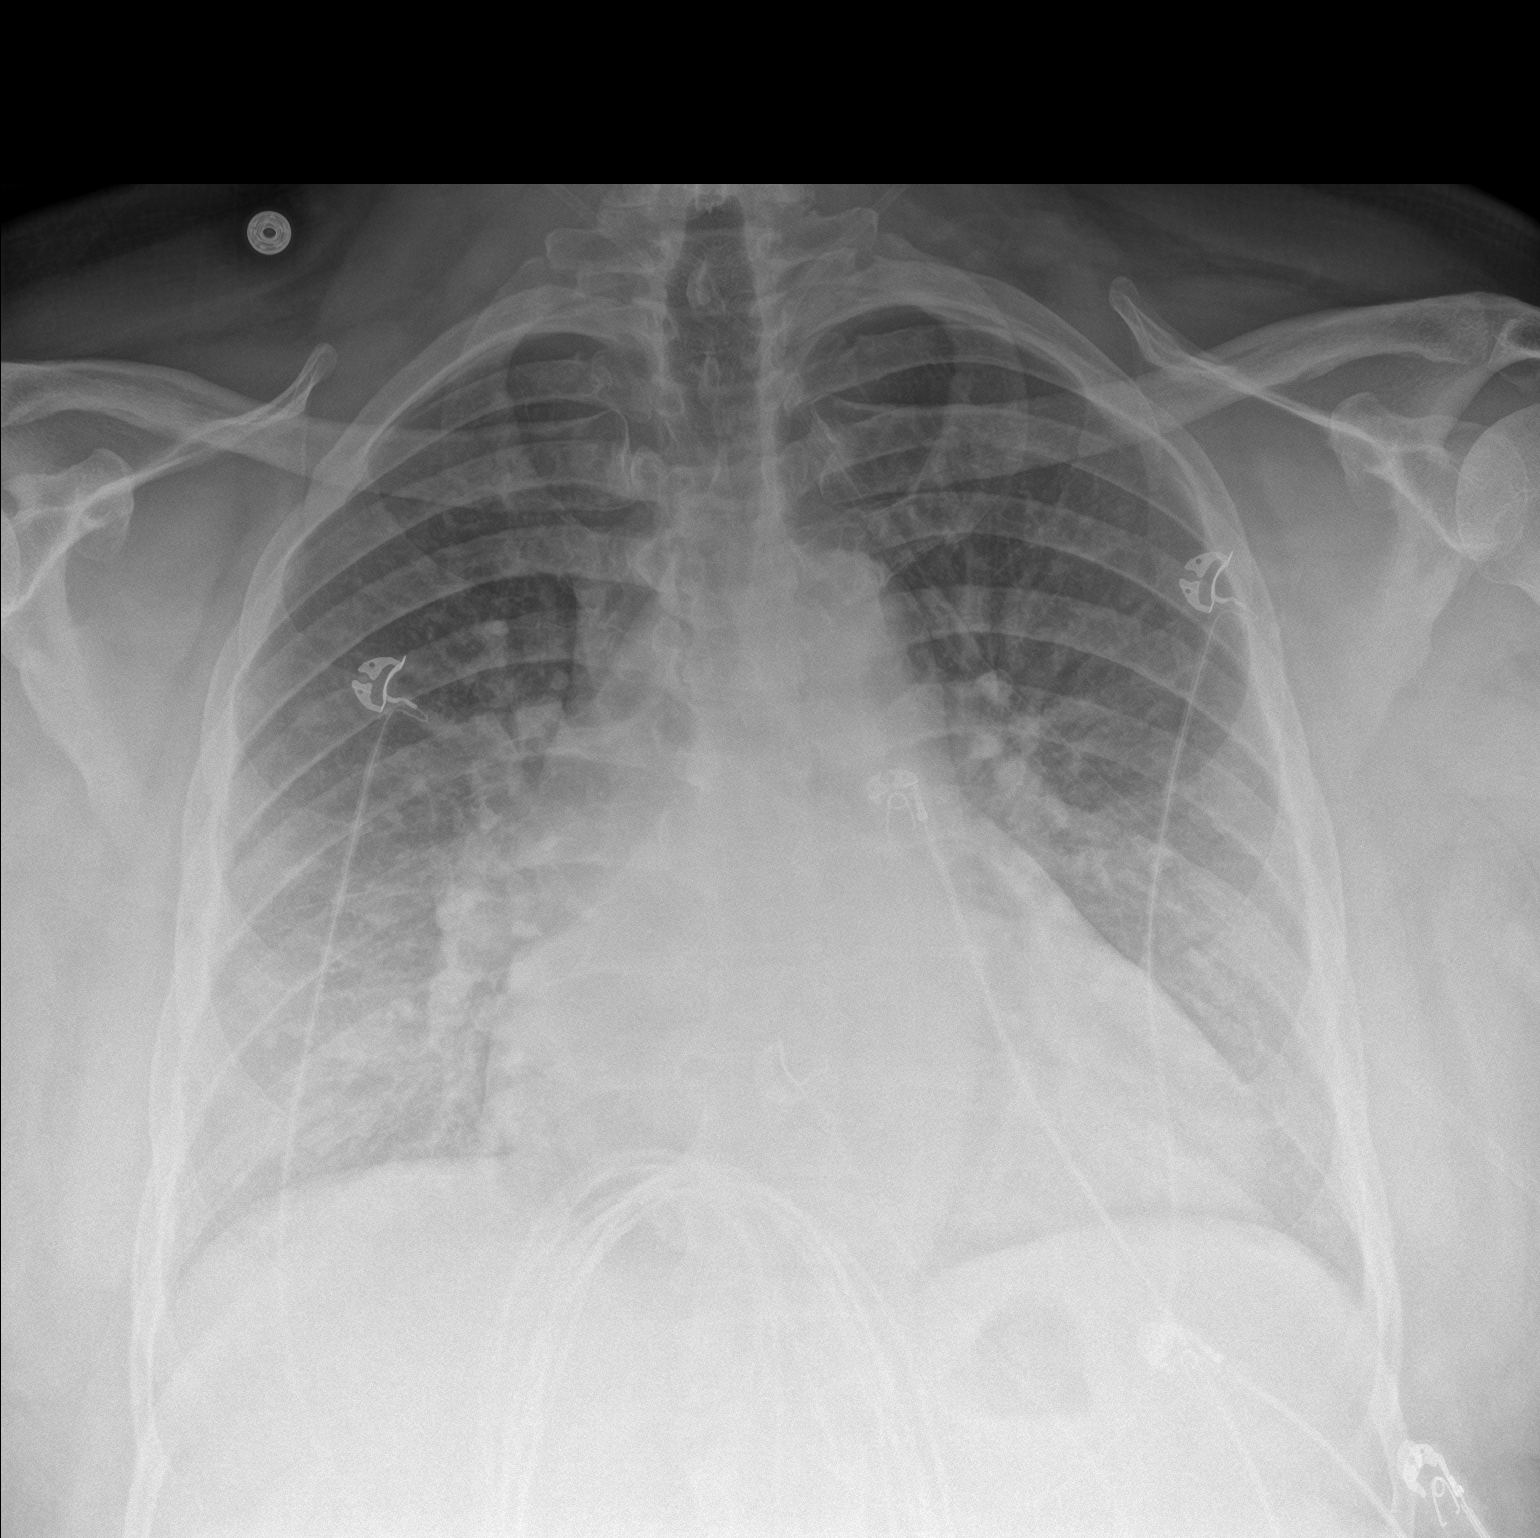

[chest lat]
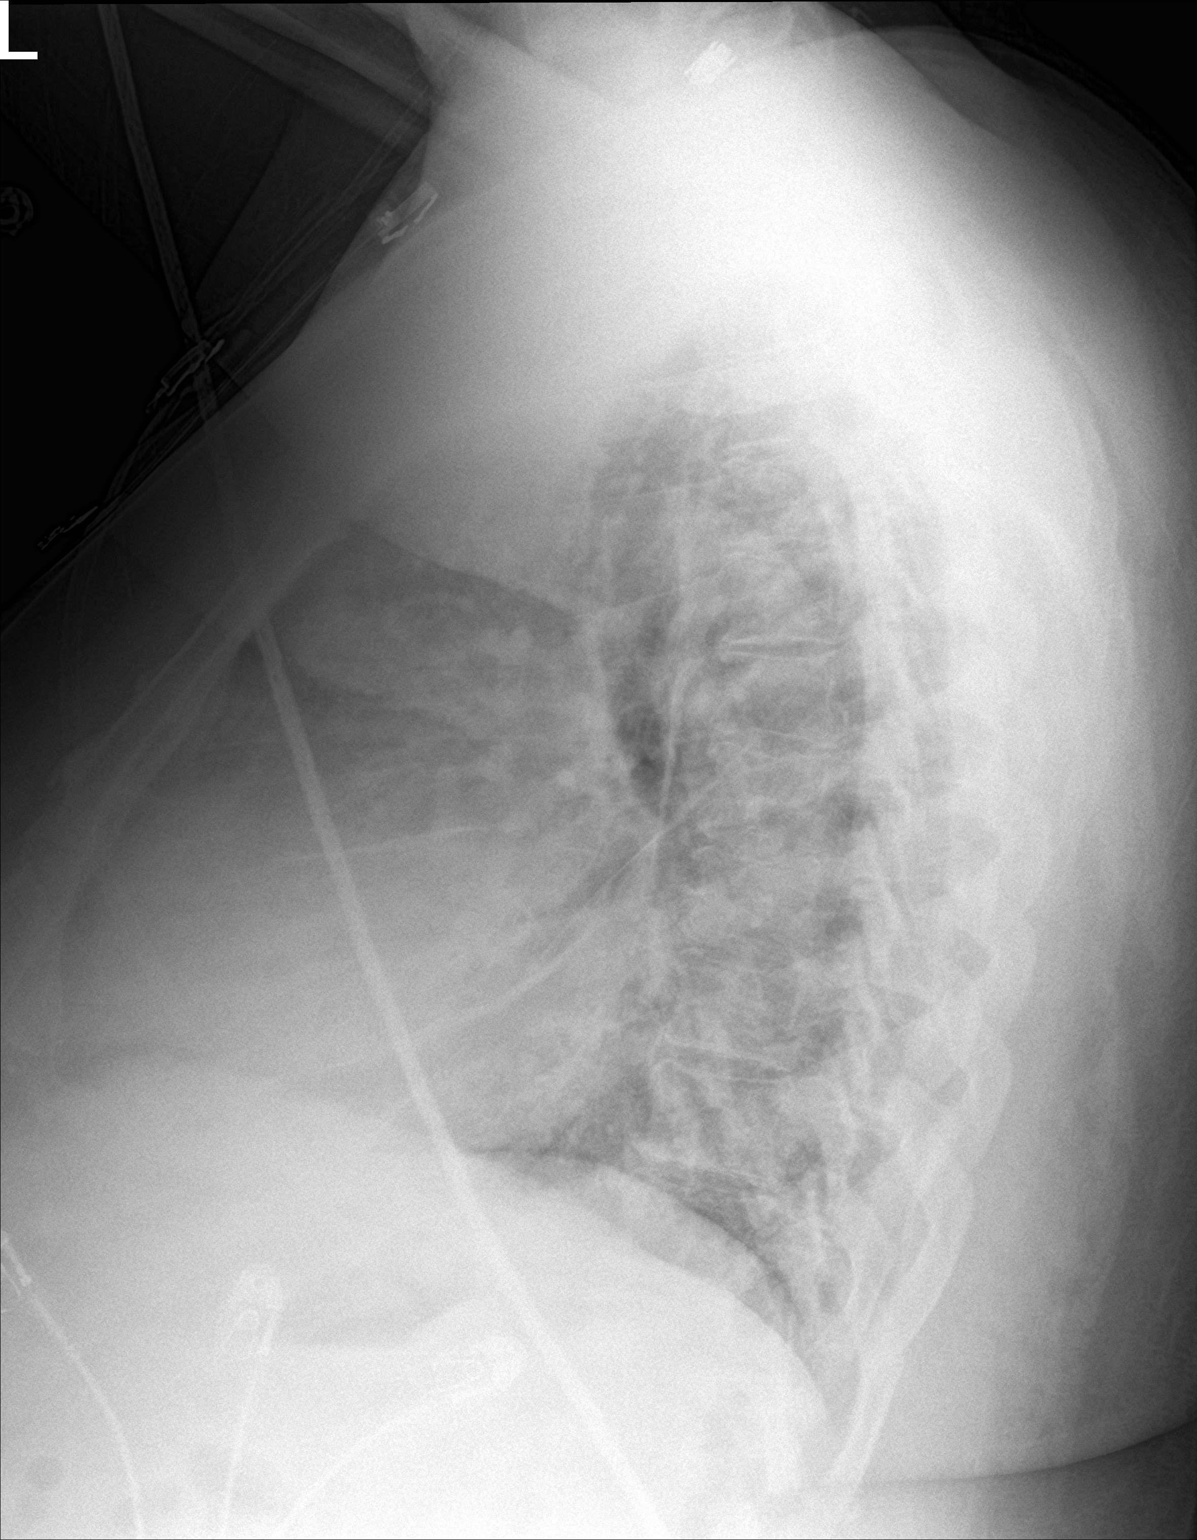

[2 of 2 positions shown; findings below may reference images not displayed]

FINDINGS: Cardiomediastinal silhouette unchanged. Fullness in the central
vasculature. Interlobular septal thickening. No pneumothorax. No
large pleural effusion.
IMPRESSION: Persisting mild pulmonary edema with no large pleural effusion.

## 2018-11-07 IMAGING — DX DG CHEST 2V
2 series · 2 of 2 positions shown · non-contrast
Comparison: PA and lateral chest x-ray March 01, 2017

CLINICAL DATA: Shortness of breath and edema. History of CHF, COPD,
and diabetes. Former smoker.

EXAM:
CHEST  2 VIEW

[chest pa]
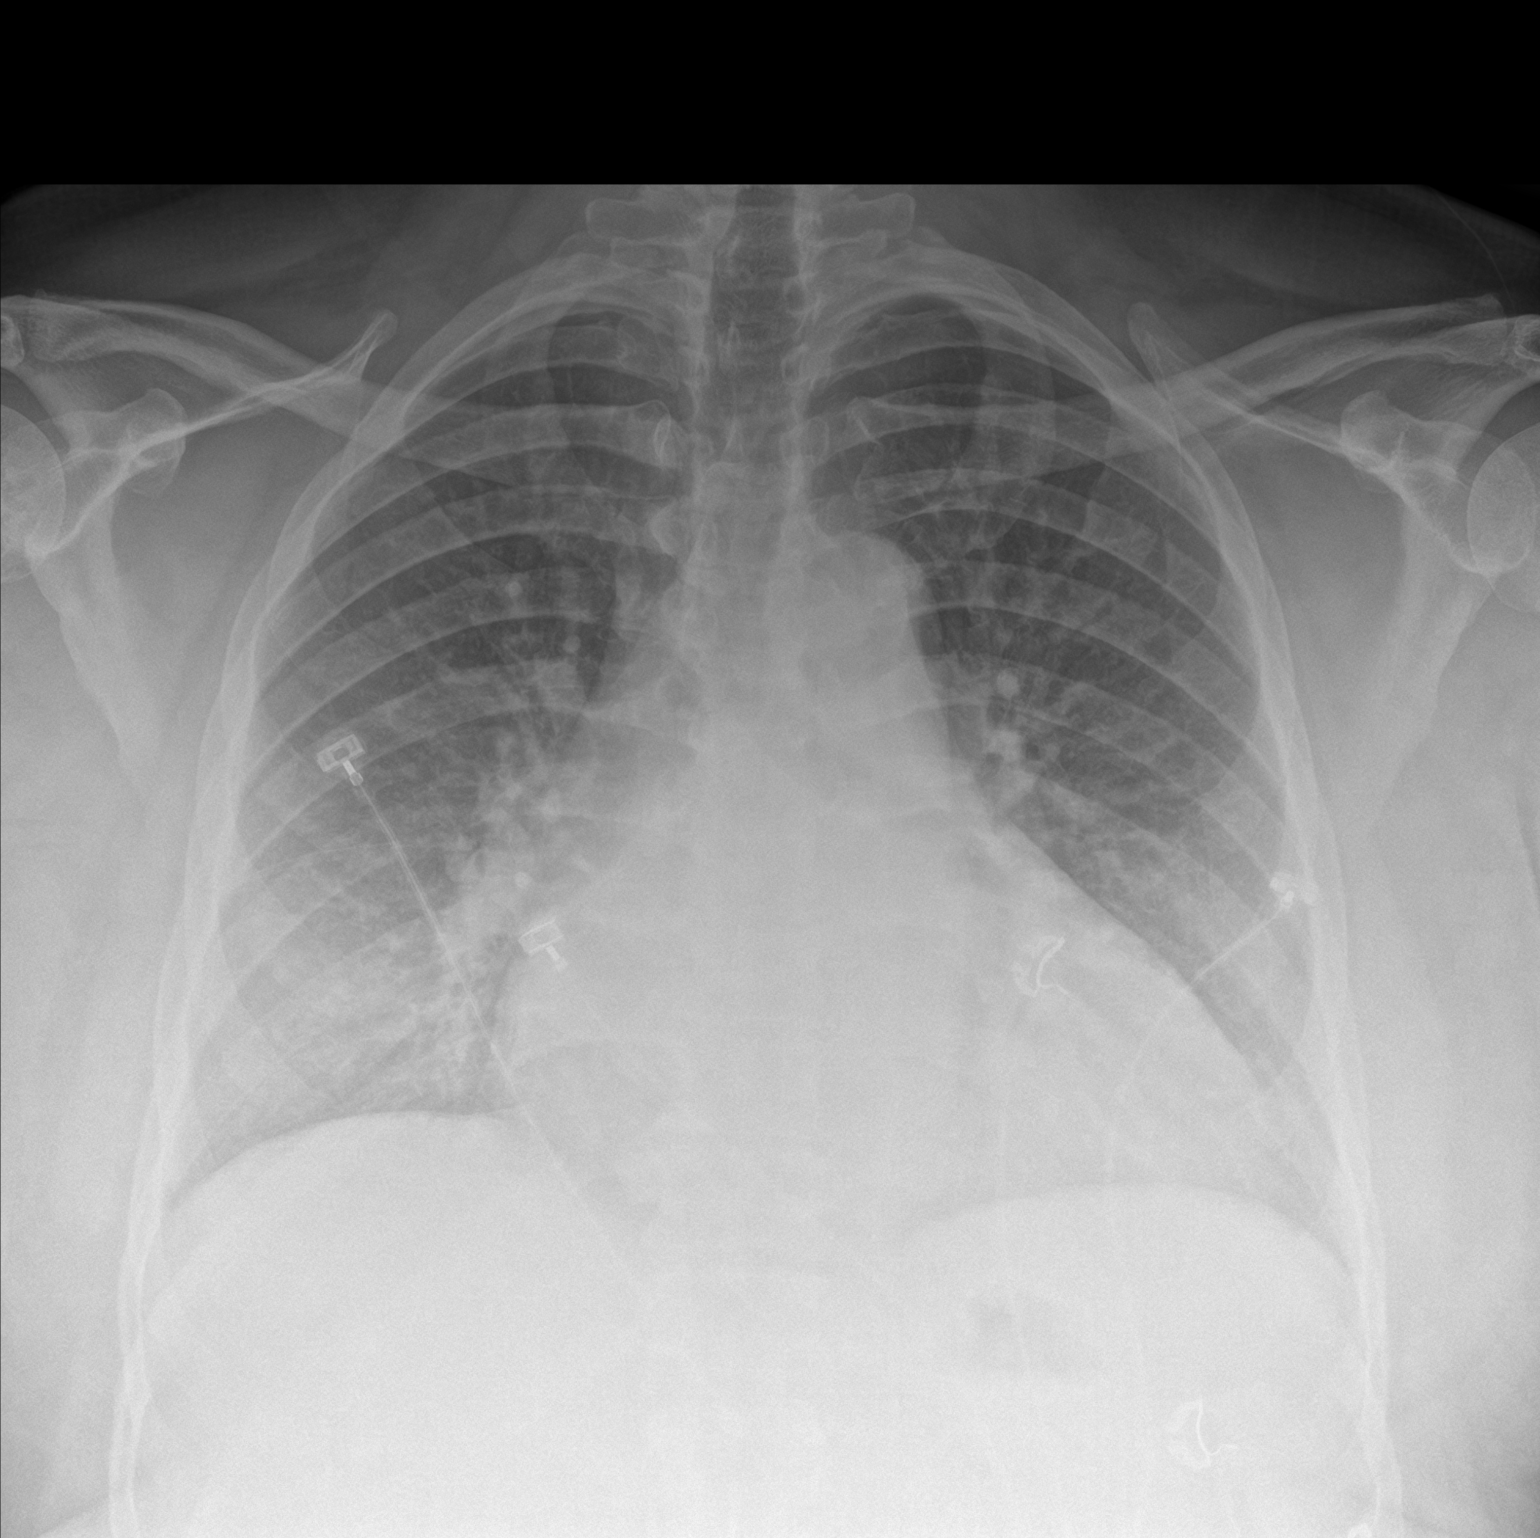

[chest lat]
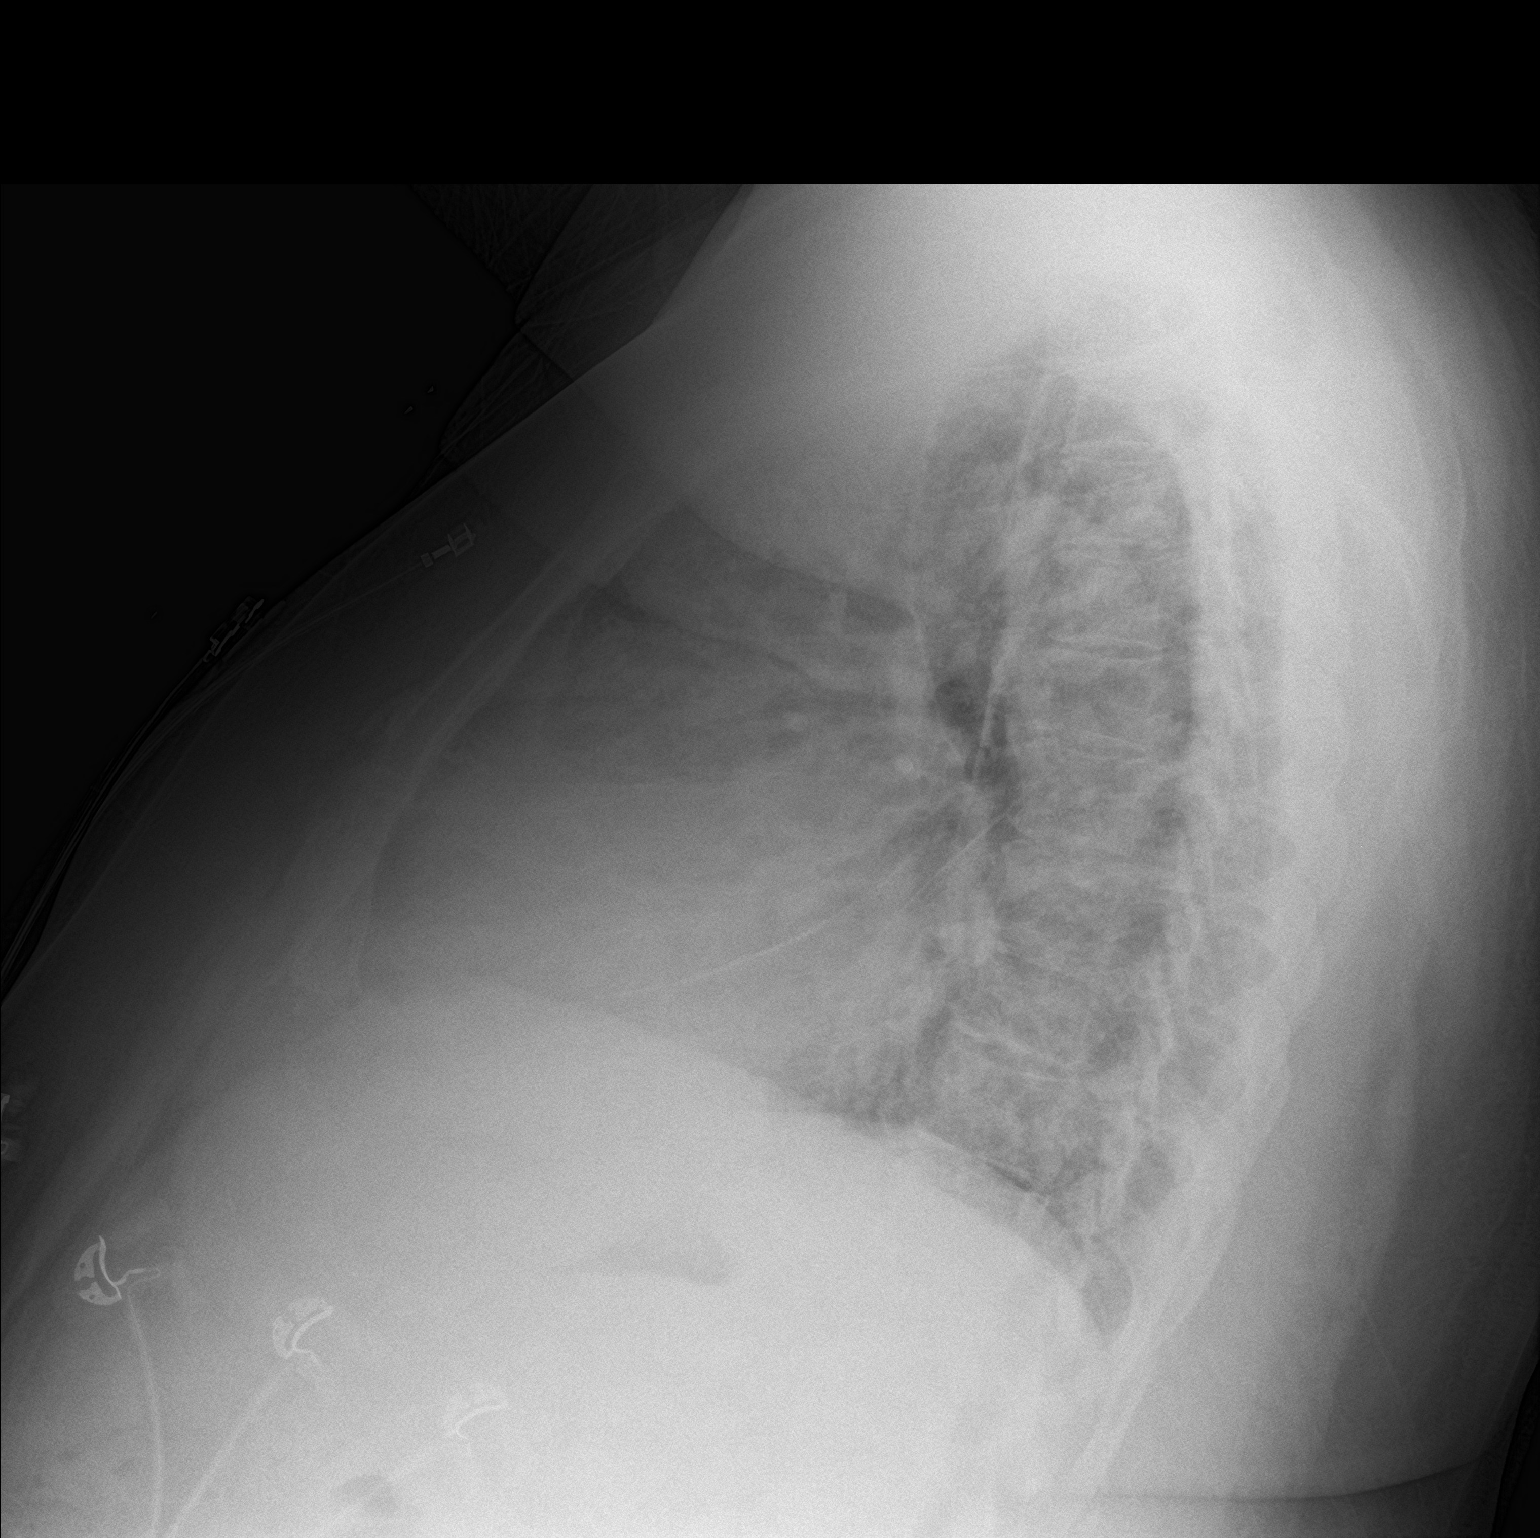

[2 of 2 positions shown; findings below may reference images not displayed]

FINDINGS: The lungs are adequately inflated. The interstitial markings are
increased. The pulmonary vascularity is engorged. The cardiac
silhouette is enlarged. There is no pleural effusion. There is
calcification in the wall of the aortic arch.
IMPRESSION: CHF with mild pulmonary interstitial edema slightly less conspicuous
than on yesterday's study.

Thoracic aortic atherosclerosis.

## 2018-11-07 NOTE — Patient Instructions (Signed)
Medication Instructions:   Stop Lasix.  Begin Bumex 2mg  three times per day.  Continue all other medications.    Labwork: none  Testing/Procedures: none  Follow-Up:   Any Other Special Instructions Will Be Listed Below (If Applicable).  If you need a refill on your cardiac medications before your next appointment, please call your pharmacy.

## 2018-11-08 ENCOUNTER — Other Ambulatory Visit: Payer: Self-pay

## 2018-11-08 DIAGNOSIS — E876 Hypokalemia: Secondary | ICD-10-CM | POA: Diagnosis not present

## 2018-11-08 DIAGNOSIS — I5043 Acute on chronic combined systolic (congestive) and diastolic (congestive) heart failure: Secondary | ICD-10-CM | POA: Diagnosis not present

## 2018-11-08 DIAGNOSIS — Z79899 Other long term (current) drug therapy: Secondary | ICD-10-CM | POA: Diagnosis not present

## 2018-11-08 DIAGNOSIS — Z9119 Patient's noncompliance with other medical treatment and regimen: Secondary | ICD-10-CM | POA: Diagnosis not present

## 2018-11-08 DIAGNOSIS — I482 Chronic atrial fibrillation, unspecified: Secondary | ICD-10-CM | POA: Diagnosis not present

## 2018-11-08 NOTE — Patient Outreach (Signed)
[facility]) Care Management  11/08/2018  KARRAR BALBONI [birth year] [id number]   Attempted outreach with Mr. Pugliano. He was preparing for a follow-up appointment at the time of the call. Will follow up with RNCM when he returns.   Katha Cabal [facility] Community Care Manager [phone]

## 2018-11-09 ENCOUNTER — Encounter (HOSPITAL_COMMUNITY): Payer: Self-pay | Admitting: Emergency Medicine

## 2018-11-09 ENCOUNTER — Ambulatory Visit: Payer: Self-pay | Admitting: Pharmacist

## 2018-11-09 ENCOUNTER — Encounter: Payer: Self-pay | Admitting: Pharmacist

## 2018-11-09 ENCOUNTER — Other Ambulatory Visit: Payer: Self-pay

## 2018-11-09 ENCOUNTER — Inpatient Hospital Stay (HOSPITAL_COMMUNITY)
Admission: EM | Admit: 2018-11-09 | Discharge: 2018-11-12 | DRG: 291 | Disposition: A | Discharge status: Home / Self Care | Payer: Medicare Other | Attending: Internal Medicine | Admitting: Internal Medicine

## 2018-11-09 ENCOUNTER — Emergency Department (HOSPITAL_COMMUNITY): Payer: Medicare Other

## 2018-11-09 DIAGNOSIS — Z7951 Long term (current) use of inhaled steroids: Secondary | ICD-10-CM

## 2018-11-09 DIAGNOSIS — I5043 Acute on chronic combined systolic (congestive) and diastolic (congestive) heart failure: Secondary | ICD-10-CM | POA: Diagnosis not present

## 2018-11-09 DIAGNOSIS — I509 Heart failure, unspecified: Secondary | ICD-10-CM | POA: Diagnosis not present

## 2018-11-09 DIAGNOSIS — J9611 Chronic respiratory failure with hypoxia: Secondary | ICD-10-CM | POA: Diagnosis not present

## 2018-11-09 DIAGNOSIS — I130 Hypertensive heart and chronic kidney disease with heart failure and stage 1 through stage 4 chronic kidney disease, or unspecified chronic kidney disease: Secondary | ICD-10-CM | POA: Diagnosis not present

## 2018-11-09 DIAGNOSIS — I499 Cardiac arrhythmia, unspecified: Secondary | ICD-10-CM | POA: Diagnosis not present

## 2018-11-09 DIAGNOSIS — J45909 Unspecified asthma, uncomplicated: Secondary | ICD-10-CM | POA: Diagnosis present

## 2018-11-09 DIAGNOSIS — Z9114 Patient's other noncompliance with medication regimen: Secondary | ICD-10-CM | POA: Diagnosis not present

## 2018-11-09 DIAGNOSIS — Z823 Family history of stroke: Secondary | ICD-10-CM | POA: Diagnosis not present

## 2018-11-09 DIAGNOSIS — I428 Other cardiomyopathies: Secondary | ICD-10-CM | POA: Diagnosis present

## 2018-11-09 DIAGNOSIS — K219 Gastro-esophageal reflux disease without esophagitis: Secondary | ICD-10-CM | POA: Diagnosis present

## 2018-11-09 DIAGNOSIS — R069 Unspecified abnormalities of breathing: Secondary | ICD-10-CM | POA: Diagnosis not present

## 2018-11-09 DIAGNOSIS — I482 Chronic atrial fibrillation, unspecified: Secondary | ICD-10-CM | POA: Diagnosis present

## 2018-11-09 DIAGNOSIS — Z6841 Body Mass Index (BMI) 40.0 and over, adult: Secondary | ICD-10-CM

## 2018-11-09 DIAGNOSIS — E662 Morbid (severe) obesity with alveolar hypoventilation: Secondary | ICD-10-CM | POA: Diagnosis present

## 2018-11-09 DIAGNOSIS — R0602 Shortness of breath: Secondary | ICD-10-CM

## 2018-11-09 DIAGNOSIS — I272 Pulmonary hypertension, unspecified: Secondary | ICD-10-CM | POA: Diagnosis not present

## 2018-11-09 DIAGNOSIS — Z79899 Other long term (current) drug therapy: Secondary | ICD-10-CM

## 2018-11-09 DIAGNOSIS — Z20828 Contact with and (suspected) exposure to other viral communicable diseases: Secondary | ICD-10-CM | POA: Diagnosis present

## 2018-11-09 DIAGNOSIS — Z8249 Family history of ischemic heart disease and other diseases of the circulatory system: Secondary | ICD-10-CM | POA: Diagnosis not present

## 2018-11-09 DIAGNOSIS — Z9111 Patient's noncompliance with dietary regimen: Secondary | ICD-10-CM | POA: Diagnosis not present

## 2018-11-09 DIAGNOSIS — I252 Old myocardial infarction: Secondary | ICD-10-CM

## 2018-11-09 DIAGNOSIS — I110 Hypertensive heart disease with heart failure: Secondary | ICD-10-CM | POA: Diagnosis not present

## 2018-11-09 DIAGNOSIS — Z87891 Personal history of nicotine dependence: Secondary | ICD-10-CM

## 2018-11-09 DIAGNOSIS — N400 Enlarged prostate without lower urinary tract symptoms: Secondary | ICD-10-CM | POA: Diagnosis present

## 2018-11-09 DIAGNOSIS — Z91199 Patient's noncompliance with other medical treatment and regimen due to unspecified reason: Secondary | ICD-10-CM

## 2018-11-09 DIAGNOSIS — E785 Hyperlipidemia, unspecified: Secondary | ICD-10-CM | POA: Diagnosis present

## 2018-11-09 DIAGNOSIS — J189 Pneumonia, unspecified organism: Secondary | ICD-10-CM

## 2018-11-09 DIAGNOSIS — I4891 Unspecified atrial fibrillation: Secondary | ICD-10-CM | POA: Diagnosis not present

## 2018-11-09 DIAGNOSIS — G4733 Obstructive sleep apnea (adult) (pediatric): Secondary | ICD-10-CM | POA: Diagnosis present

## 2018-11-09 DIAGNOSIS — I890 Lymphedema, not elsewhere classified: Secondary | ICD-10-CM | POA: Diagnosis present

## 2018-11-09 DIAGNOSIS — Z886 Allergy status to analgesic agent status: Secondary | ICD-10-CM

## 2018-11-09 DIAGNOSIS — N182 Chronic kidney disease, stage 2 (mild): Secondary | ICD-10-CM | POA: Diagnosis present

## 2018-11-09 DIAGNOSIS — Z7901 Long term (current) use of anticoagulants: Secondary | ICD-10-CM

## 2018-11-09 DIAGNOSIS — R0689 Other abnormalities of breathing: Secondary | ICD-10-CM | POA: Diagnosis not present

## 2018-11-09 DIAGNOSIS — Z9981 Dependence on supplemental oxygen: Secondary | ICD-10-CM | POA: Diagnosis not present

## 2018-11-09 DIAGNOSIS — Z9119 Patient's noncompliance with other medical treatment and regimen: Secondary | ICD-10-CM

## 2018-11-09 DIAGNOSIS — Z888 Allergy status to other drugs, medicaments and biological substances status: Secondary | ICD-10-CM

## 2018-11-09 DIAGNOSIS — J449 Chronic obstructive pulmonary disease, unspecified: Secondary | ICD-10-CM | POA: Diagnosis not present

## 2018-11-09 LAB — COMPREHENSIVE METABOLIC PANEL
ALT: 16 U/L (ref 0–44)
AST: 19 U/L (ref 15–41)
Albumin: 3.8 g/dL (ref 3.5–5.0)
Alkaline Phosphatase: 66 U/L (ref 38–126)
Anion gap: 5 (ref 5–15)
BUN: 14 mg/dL (ref 6–20)
CO2: 24 mmol/L (ref 22–32)
Calcium: 8.7 mg/dL — ABNORMAL LOW (ref 8.9–10.3)
Chloride: 110 mmol/L (ref 98–111)
Creatinine, Ser: 1.04 mg/dL (ref 0.61–1.24)
GFR calc Af Amer: >60 mL/min (ref >60)
GFR calc non Af Amer: >60 mL/min (ref >60)
Glucose, Bld: 98 mg/dL (ref 70–99)
Potassium: 3.8 mmol/L (ref 3.5–5.1)
Sodium: 139 mmol/L (ref 135–145)
Total Bilirubin: 0.7 mg/dL (ref 0.3–1.2)
Total Protein: 6.9 g/dL (ref 6.5–8.1)

## 2018-11-09 LAB — CBC WITH DIFFERENTIAL/PLATELET
Abs Immature Granulocytes: 0.01 10*3/uL (ref 0.00–0.07)
Basophils Absolute: 0 10*3/uL (ref 0.0–0.1)
Basophils Relative: 0 %
Eosinophils Absolute: 0.1 10*3/uL (ref 0.0–0.5)
Eosinophils Relative: 2 %
HCT: 40.1 % (ref 39.0–52.0)
Hemoglobin: 12.7 g/dL — ABNORMAL LOW (ref 13.0–17.0)
Immature Granulocytes: 0 %
Lymphocytes Relative: 23 %
Lymphs Abs: 1.2 10*3/uL (ref 0.7–4.0)
MCH: 27.9 pg (ref 26.0–34.0)
MCHC: 31.7 g/dL (ref 30.0–36.0)
MCV: 88.1 fL (ref 80.0–100.0)
Monocytes Absolute: 0.5 10*3/uL (ref 0.1–1.0)
Monocytes Relative: 9 %
Neutro Abs: 3.3 10*3/uL (ref 1.7–7.7)
Neutrophils Relative %: 66 %
Platelets: 199 10*3/uL (ref 150–400)
RBC: 4.55 MIL/uL (ref 4.22–5.81)
RDW: 14.4 % (ref 11.5–15.5)
WBC: 5.1 10*3/uL (ref 4.0–10.5)
nRBC: 0 % (ref 0.0–0.2)

## 2018-11-09 LAB — GLUCOSE, CAPILLARY
Glucose-Capillary: 77 mg/dL (ref 70–99)
Glucose-Capillary: 91 mg/dL (ref 70–99)

## 2018-11-09 LAB — SARS CORONAVIRUS 2 BY RT PCR (HOSPITAL ORDER, PERFORMED IN ~~LOC~~ HOSPITAL LAB): SARS Coronavirus 2: NEGATIVE

## 2018-11-09 LAB — STREP PNEUMONIAE URINARY ANTIGEN: Strep Pneumo Urinary Antigen: NEGATIVE

## 2018-11-09 LAB — MRSA PCR SCREENING: MRSA by PCR: NEGATIVE

## 2018-11-09 LAB — TROPONIN I: Troponin I: 0.03 ng/mL (ref <0.03)

## 2018-11-09 LAB — PROCALCITONIN: Procalcitonin: <0.1 ng/mL

## 2018-11-09 LAB — BRAIN NATRIURETIC PEPTIDE: B Natriuretic Peptide: 357 pg/mL — ABNORMAL HIGH (ref 0.0–100.0)

## 2018-11-09 MED ORDER — FERROUS GLUCONATE 324 (38 FE) MG PO TABS
324.0000 mg | ORAL_TABLET | Freq: Two times a day (BID) | ORAL | Status: DC
  Administered 2018-11-09 – 2018-11-12 (×6): 324 mg via ORAL
  Filled 2018-11-09 (×10): qty 1

## 2018-11-09 MED ORDER — PANTOPRAZOLE SODIUM 40 MG PO TBEC
40.0000 mg | DELAYED_RELEASE_TABLET | Freq: Every day | ORAL | Status: DC
  Administered 2018-11-09 – 2018-11-12 (×4): 40 mg via ORAL
  Filled 2018-11-09 (×4): qty 1

## 2018-11-09 MED ORDER — SPIRONOLACTONE 25 MG PO TABS
100.0000 mg | ORAL_TABLET | Freq: Every day | ORAL | Status: DC
  Administered 2018-11-09: 13:00:00 50 mg via ORAL
  Administered 2018-11-09 – 2018-11-12 (×4): 100 mg via ORAL
  Filled 2018-11-09 (×4): qty 4

## 2018-11-09 MED ORDER — FUROSEMIDE 10 MG/ML IJ SOLN
80.0000 mg | Freq: Two times a day (BID) | INTRAMUSCULAR | Status: DC
  Administered 2018-11-09 – 2018-11-12 (×6): 80 mg via INTRAVENOUS
  Filled 2018-11-09 (×6): qty 8

## 2018-11-09 MED ORDER — ONDANSETRON HCL 4 MG/2ML IJ SOLN: 4.0000 mg | Freq: Four times a day (QID) | INTRAMUSCULAR | Status: DC | PRN

## 2018-11-09 MED ORDER — ATORVASTATIN CALCIUM 40 MG PO TABS
40.0000 mg | ORAL_TABLET | Freq: Every day | ORAL | Status: DC
  Administered 2018-11-09 – 2018-11-12 (×4): 40 mg via ORAL
  Filled 2018-11-09 (×4): qty 1

## 2018-11-09 MED ORDER — SODIUM CHLORIDE 0.9 % IV SOLN
1.0000 g | Freq: Three times a day (TID) | INTRAVENOUS | Status: DC
  Administered 2018-11-09: 1 g via INTRAVENOUS
  Filled 2018-11-09: qty 1

## 2018-11-09 MED ORDER — SODIUM CHLORIDE 0.9% FLUSH
3.0000 mL | Freq: Two times a day (BID) | INTRAVENOUS | Status: DC
  Administered 2018-11-09 – 2018-11-12 (×6): 3 mL via INTRAVENOUS

## 2018-11-09 MED ORDER — ONDANSETRON HCL 4 MG PO TABS: 4.0000 mg | ORAL_TABLET | Freq: Four times a day (QID) | ORAL | Status: DC | PRN

## 2018-11-09 MED ORDER — ACETAMINOPHEN 325 MG PO TABS: 650.0000 mg | ORAL_TABLET | Freq: Four times a day (QID) | ORAL | Status: DC | PRN

## 2018-11-09 MED ORDER — FUROSEMIDE 10 MG/ML IJ SOLN
80.0000 mg | Freq: Once | INTRAMUSCULAR | Status: AC
  Administered 2018-11-09: 07:00:00 80 mg via INTRAVENOUS
  Filled 2018-11-09: qty 8

## 2018-11-09 MED ORDER — VANCOMYCIN HCL 1.5 G IV SOLR
1500.0000 mg | Freq: Two times a day (BID) | INTRAVENOUS | Status: DC
  Filled 2018-11-09 (×2): qty 1500

## 2018-11-09 MED ORDER — POTASSIUM CHLORIDE CRYS ER 20 MEQ PO TBCR
40.0000 meq | EXTENDED_RELEASE_TABLET | Freq: Two times a day (BID) | ORAL | Status: DC
  Administered 2018-11-09 – 2018-11-12 (×7): 40 meq via ORAL
  Filled 2018-11-09 (×7): qty 2

## 2018-11-09 MED ORDER — HYDRALAZINE HCL 20 MG/ML IJ SOLN
5.0000 mg | Freq: Once | INTRAMUSCULAR | Status: AC
  Administered 2018-11-09: 5 mg via INTRAVENOUS
  Filled 2018-11-09: qty 1

## 2018-11-09 MED ORDER — DOCUSATE SODIUM 100 MG PO CAPS: 100.0000 mg | ORAL_CAPSULE | Freq: Every day | ORAL | Status: DC | PRN

## 2018-11-09 MED ORDER — ALBUTEROL SULFATE (2.5 MG/3ML) 0.083% IN NEBU: 2.5000 mg | INHALATION_SOLUTION | Freq: Four times a day (QID) | RESPIRATORY_TRACT | Status: DC | PRN

## 2018-11-09 MED ORDER — ONDANSETRON HCL 4 MG/2ML IJ SOLN
4.0000 mg | Freq: Once | INTRAMUSCULAR | Status: AC
  Administered 2018-11-09: 07:00:00 4 mg via INTRAVENOUS
  Filled 2018-11-09: qty 2

## 2018-11-09 MED ORDER — SODIUM CHLORIDE 0.9% FLUSH: 3.0000 mL | INTRAVENOUS | Status: DC | PRN

## 2018-11-09 MED ORDER — VANCOMYCIN HCL IN DEXTROSE 1-5 GM/200ML-% IV SOLN
1000.0000 mg | INTRAVENOUS | Status: AC
  Administered 2018-11-09: 09:00:00 1000 mg via INTRAVENOUS
  Filled 2018-11-09: qty 200

## 2018-11-09 MED ORDER — SODIUM CHLORIDE 0.9 % IV SOLN
2.0000 g | Freq: Three times a day (TID) | INTRAVENOUS | Status: DC
  Administered 2018-11-09 – 2018-11-10 (×3): 2 g via INTRAVENOUS
  Filled 2018-11-09 (×3): qty 2

## 2018-11-09 MED ORDER — SODIUM CHLORIDE 0.9 % IV SOLN
INTRAVENOUS | Status: DC | PRN
  Administered 2018-11-09: 500 mL via INTRAVENOUS

## 2018-11-09 MED ORDER — VANCOMYCIN HCL IN DEXTROSE 1-5 GM/200ML-% IV SOLN
1000.0000 mg | Freq: Once | INTRAVENOUS | Status: AC
  Administered 2018-11-09: 1000 mg via INTRAVENOUS

## 2018-11-09 MED ORDER — ACETAMINOPHEN 650 MG RE SUPP: 650.0000 mg | Freq: Four times a day (QID) | RECTAL | Status: DC | PRN

## 2018-11-09 MED ORDER — SODIUM CHLORIDE 0.9 % IV SOLN: 250.0000 mL | INTRAVENOUS | Status: DC | PRN

## 2018-11-09 MED ORDER — RIVAROXABAN 20 MG PO TABS
20.0000 mg | ORAL_TABLET | Freq: Every day | ORAL | Status: DC
  Administered 2018-11-10 – 2018-11-12 (×3): 20 mg via ORAL
  Filled 2018-11-09 (×3): qty 1

## 2018-11-09 MED ORDER — CARVEDILOL 12.5 MG PO TABS
25.0000 mg | ORAL_TABLET | Freq: Two times a day (BID) | ORAL | Status: DC
  Administered 2018-11-09 – 2018-11-12 (×7): 25 mg via ORAL
  Filled 2018-11-09 (×7): qty 2

## 2018-11-09 NOTE — ED Provider Notes (Signed)
[facility]GENCY DEPARTMENT Provider Note   CSN: [id number] Arrival date & time: 11/09/18  [id number]    History   Chief Complaint Chief Complaint  Patient presents with  . Shortness of Breath    HPI [name] is a 53 y.o. male.     Patient with severe congestive heart failure and poorly controlled blood pressure.  Patient states that he is short of breath and has been gaining weight again.  Patient states he has been taking his medicines  The history is provided by the patient.  Shortness of Breath  Severity:  Moderate Onset quality:  Gradual Timing:  Constant Progression:  Worsening Chronicity:  Recurrent Context: activity   Relieved by:  Nothing Worsened by:  Activity Ineffective treatments:  Position changes Associated symptoms: no abdominal pain, no chest pain, no cough, no headaches and no rash     Past Medical History:  Diagnosis Date  . Allergic rhinitis   . Asthma   . Atrial fibrillation (HCC)   . CHF (congestive heart failure) (HCC)   . Elevated troponin level not due myocardial infarction 06/13/2015   cath w/ nl cors, felt 2nd HTN, CHF, peat trop 2.73  . Essential hypertension   . Gastroesophageal reflux disease   . History of cardiac catheterization    Normal coronaries December 2016  . Noncompliance    Major problem leading to declining health and recurrent hospitalization  . Nonischemic cardiomyopathy (HCC)    a. LVEF 20-25% in 2017 with cath in 2016 showing normal cors. b. EF 55% by echo in 03/2018.  . On home O2    3L N/C  . OSA (obstructive sleep apnea)   . Osteoarthritis   . Peptic ulcer disease     Patient Active Problem List   Diagnosis Date Noted  . Chronic obstructive pulmonary disease (HCC)   . Benign prostatic hyperplasia without lower urinary tract symptoms   . Acute on chronic combined systolic and diastolic CHF (congestive heart failure) (HCC) 10/02/2018  . AKI (acute kidney injury) (HCC) 09/18/2018  . Acute on chronic  combined systolic and diastolic congestive heart failure (HCC) 08/15/2018  . SOB (shortness of breath) 08/03/2018  . Volume overload 07/22/2018  . Acute systolic CHF (congestive heart failure) (HCC) 07/04/2018  . Acute on chronic combined systolic and diastolic heart failure (HCC) 04/01/2018  . Hypomagnesemia 04/01/2018  . Acute on chronic diastolic CHF (congestive heart failure) (HCC) 03/11/2018  . Pressure injury of skin 03/11/2018  . CHF (congestive heart failure) (HCC) 09/08/2017  . Altered mental status 05/11/2017  . Acute exacerbation of CHF (congestive heart failure) (HCC) 04/20/2017  . Acute on chronic systolic (congestive) heart failure (HCC) 04/19/2017  . CHF exacerbation (HCC) 04/11/2017  . Chronic respiratory failure with hypoxia (HCC) 04/11/2017  . Encounter for hospice care discussion   . Palliative care encounter   . Goals of care, counseling/discussion   . DNR (do not resuscitate)   . DNR (do not resuscitate) discussion   . Chronic congestive heart failure (HCC) 02/18/2017  . Acute systolic heart failure (HCC) 01/18/2017  . Pedal edema 12/02/2016  . Acute CHF (congestive heart failure) (HCC) 11/09/2016  . Acute on chronic systolic CHF (congestive heart failure) (HCC) 08/19/2016  . CKD (chronic kidney disease), stage II 05/17/2016  . Coronary arteries, normal Dec 2016 01/23/2016  . Chronic anticoagulation-Xarelto 01/23/2016  . NSVT (nonsustained ventricular tachycardia) (HCC) 01/23/2016  . Elevated troponin 12/21/2015  . Nonischemic cardiomyopathy (HCC)   . Morbid obesity due to  excess calories (HCC)   . Noncompliance with diet and medication regimen 12/02/2015  . OSA (obstructive sleep apnea) 09/20/2015  . Pulmonary hypertension (HCC) 09/19/2015  . Normocytic anemia 09/19/2015  . Atrial fibrillation, chronic   . Dyspnea on exertion 05/28/2015  . Acute respiratory failure with hypoxia (HCC) 04/01/2015  . Hypokalemia 04/01/2015  . Obesity hypoventilation syndrome  (HCC) 08/08/2014  . GERD (gastroesophageal reflux disease) 08/08/2014  . Edema of right lower extremity 06/21/2014  . Fecal urgency 06/21/2014  . Asthma 02/11/2009  . Hyperlipidemia 07/12/2008  . OBESITY, MORBID 07/12/2008  . Anxiety state 07/12/2008  . DEPRESSION 07/12/2008  . Essential hypertension 07/12/2008  . ALLERGIC RHINITIS 07/12/2008  . Peptic ulcer 07/12/2008  . OSTEOARTHRITIS 07/12/2008    Past Surgical History:  Procedure Laterality Date  . CARDIAC CATHETERIZATION N/A 06/14/2015   Procedure: Right/Left Heart Cath and Coronary Angiography;  Surgeon: Runell Gess, MD;  Location: [facility] INVASIVE CV LAB;  Service: Cardiovascular;  Laterality: N/A;        Home Medications    Prior to Admission medications   Medication Sig Start Date End Date Taking? Authorizing Provider  acetaminophen (TYLENOL) 325 MG tablet Take 2 tablets (650 mg total) by mouth every 4 (four) hours as needed for headache or mild pain. 11/02/18   Calvert Cantor, MD  albuterol (PROVENTIL) (2.5 MG/3ML) 0.083% nebulizer solution Take 2.5 mg by nebulization every 6 (six) hours as needed for wheezing or shortness of breath.    [provider]  atorvastatin (LIPITOR) 40 MG tablet Take 40 mg by mouth daily.  09/05/18   [provider]  bumetanide (BUMEX) 2 MG tablet Take 2 mg by mouth 2 (two) times daily.    [provider]  carvedilol (COREG) 25 MG tablet Take 1 tablet (25 mg total) by mouth 2 (two) times daily with a meal for 30 days. Patient taking differently: Take 25 mg by mouth 2 (two) times a day.  08/18/18 11/04/19  Sherryll Burger, Pratik D, DO  docusate sodium (COLACE) 100 MG capsule Take 1 capsule (100 mg total) by mouth 2 (two) times daily. Patient taking differently: Take 100 mg by mouth daily as needed for mild constipation or moderate constipation.  06/25/16   Kathlen Mody, MD  ferrous gluconate (FERGON) 324 MG tablet Take 1 tablet (324 mg total) by mouth 2 (two) times daily with a meal.  Patient taking differently: Take 324 mg by mouth daily.  06/25/16   Kathlen Mody, MD  fluticasone (FLOVENT HFA) 220 MCG/ACT inhaler Inhale 2 puffs into the lungs daily as needed (for shortness of breath).     [provider]  furosemide (LASIX) 40 MG tablet Take 40 mg by mouth 2 (two) times daily.    [provider]  furosemide (LASIX) 80 MG tablet Take 1.5 tablets (120 mg total) by mouth 2 (two) times daily. 10/07/18 01/05/19  Vassie Loll, MD  lisinopril (ZESTRIL) 5 MG tablet Take 2 tablets (10 mg total) by mouth daily. 11/02/18   Calvert Cantor, MD  OXYGEN Inhale 3.5 L into the lungs continuous.     [provider]  pantoprazole (PROTONIX) 40 MG tablet Take 1 tablet (40 mg total) by mouth daily at 6 (six) AM. Patient taking differently: Take 40 mg by mouth daily.  06/26/16   Kathlen Mody, MD  potassium chloride SA (K-DUR) 20 MEQ tablet Take 2 tablets (40 mEq total) by mouth 2 (two) times daily. 10/07/18   Vassie Loll, MD  rivaroxaban (XARELTO) 20 MG TABS  tablet Take 1 tablet (20 mg total) by mouth daily with breakfast. Patient taking differently: Take 20 mg by mouth daily with supper.  06/26/16   Kathlen Mody, MD  spironolactone (ALDACTONE) 100 MG tablet Take 100 mg by mouth daily. For fluid 03/18/17   [provider]  tamsulosin (FLOMAX) 0.4 MG CAPS capsule Take 1 capsule (0.4 mg total) by mouth daily. 06/25/16   Kathlen Mody, MD  potassium chloride 20 MEQ TBCR Take 20 mEq by mouth 2 (two) times daily. 09/03/15 11/04/18  Ward, Layla Maw, DO    Family History Family History  Problem Relation Age of Onset  . Stroke Father   . Heart attack Father   . Aneurysm Mother        Cerebral aneurysm  . Hypertension Sister   . Colon cancer Neg Hx   . Inflammatory bowel disease Neg Hx   . Liver disease Neg Hx     Social History Social History   Tobacco Use  . Smoking status: Former Smoker    Packs/day: 0.50    Years: 20.00    Pack years: 10.00    Types:  Cigarettes    Start date: 04/26/1988    Last attempt to quit: 06/23/2007    Years since quitting: 11.3  . Smokeless tobacco: Never Used  . Tobacco comment: 1 ppd former smoker  Substance Use Topics  . Alcohol use: No    Alcohol/week: 0.0 standard drinks    Comment: No etoh since 2009  . Drug use: No     Allergies   Banana; Bee venom; Strawberry flavor; Aspirin; Metolazone; Oatmeal; Orange juice [orange oil]; Torsemide; Diltiazem; Hydralazine; and Lipitor [atorvastatin]   Review of Systems Review of Systems  Constitutional: Negative for appetite change and fatigue.  HENT: Negative for congestion, ear discharge and sinus pressure.   Eyes: Negative for discharge.  Respiratory: Positive for shortness of breath. Negative for cough.   Cardiovascular: Positive for leg swelling. Negative for chest pain.  Gastrointestinal: Negative for abdominal pain and diarrhea.  Genitourinary: Negative for frequency and hematuria.  Musculoskeletal: Negative for back pain.  Skin: Negative for rash.  Neurological: Negative for seizures and headaches.  Psychiatric/Behavioral: Negative for hallucinations.     Physical Exam Updated Vital Signs BP (!) 175/116 (BP Location: Right Arm)   Pulse 87   Temp 99.2 F (37.3 C) (Oral)   Resp (!) 22   Ht 6' (1.829 m)   Wt (!) 171.9 kg   SpO2 96%   BMI 51.40 kg/m   Physical Exam Vitals signs and nursing note reviewed.  Constitutional:      Appearance: He is well-developed.  HENT:     Head: Normocephalic.     Nose: Nose normal.  Eyes:     General: No scleral icterus.    Conjunctiva/sclera: Conjunctivae normal.  Neck:     Musculoskeletal: Neck supple.     Thyroid: No thyromegaly.  Cardiovascular:     Rate and Rhythm: Normal rate and regular rhythm.     Heart sounds: No murmur. No friction rub. No gallop.   Pulmonary:     Breath sounds: No stridor. No wheezing or rales.  Chest:     Chest wall: No tenderness.  Abdominal:     General: There is no  distension.     Tenderness: There is no abdominal tenderness. There is no rebound.  Musculoskeletal: Normal range of motion.     Comments: 3+ edema all the way up both legs  Lymphadenopathy:  Cervical: No cervical adenopathy.  Skin:    Findings: No erythema or rash.  Neurological:     Mental Status: He is oriented to person, place, and time.     Motor: No abnormal muscle tone.     Coordination: Coordination normal.  Psychiatric:        Behavior: Behavior normal.      ED Treatments / Results  Labs (all labs ordered are listed, but only abnormal results are displayed) Labs Reviewed  CBC WITH DIFFERENTIAL/PLATELET  COMPREHENSIVE METABOLIC PANEL  TROPONIN I  BRAIN NATRIURETIC PEPTIDE    EKG None  Radiology No results found.  Procedures Procedures (including critical care time)  Medications Ordered in ED Medications  furosemide (LASIX) injection 80 mg (has no administration in time range)  hydrALAZINE (APRESOLINE) injection 5 mg (has no administration in time range)     Initial Impression / Assessment and Plan / ED Course  I have reviewed the triage vital signs and the nursing notes.  Pertinent labs & imaging results that were available during my care of the patient were reviewed by me and considered in my medical decision making (see chart for details).        Patient with congestive heart failure and poorly controlled blood pressure.  He will get some Lasix for diuresis and hydralazine for his blood pressure.  Patient also will be weighed to see if his weight has increased.  Patient's weight at discharge from the hospital on May 13 was 164 kg.  When he was admitted he was 176 kg  Final Clinical Impressions(s) / ED Diagnoses   Final diagnoses:  None    ED Discharge Orders    None       Bethann Berkshire, MD 11/09/18 [phone]

## 2018-11-09 NOTE — H&P (Signed)
History and Physical    [name] WNU:[id number] DOB: 08/16/1965 DOA: 11/09/2018  PCP: Pearson Grippe, MD   Patient coming from: Home  Chief Complaint: Dyspnea  HPI: [name] is a 53 y.o. male with medical history significant for chronic combined systolic and diastolic CHF with recurrent readmissions due to dietary medication noncompliance, atrial fibrillation on Xarelto, hypertension, MI, asthma, OSA on CPAP, and morbid obesity who presents back to the emergency department after recent discharge on 5/13 for acute heart failure exacerbation at that time.  Patient has had worsening shortness of breath along with nonproductive cough and potentially some fevers at home.  His weight at the time of discharge on 5/13 was noted to be 360 pounds and he was noted to be up to 379 pounds on 5/15 during a cardiology telehealth visit.  Today he is noted to be at 388 pounds and continues to have worsening lower extremity swelling and some shortness of breath.  He denies any specific chest pain.  He continuously is consuming high sodium foods to include lots of fish at home and declines taking his usual home medications.  He continues to state that "the medications do not work very well."  This sentiment was well noted on his recent telehealth visit with cardiology on 5/15 and at that time he was recommended to continue spironolactone and try Bumex 2 mg 3 times daily and Lasix was held at that time.  Patient appears to believe that his home generic furosemide does not work and that the IV medications are the only thing that helps him.   ED Course: Vital signs are noted to be stable with no acute findings.  Two-view chest x-ray reveals some left-sided infiltrate.  Laboratory data without any significant acute findings, aside from BNP that is noted to be 357.  He was given 80 mg of IV Lasix in the ED as well as some hydralazine for initial blood pressure elevations.  He has had blood cultures drawn and has been  started on cefepime and vancomycin for suspected H CAP.  Review of Systems: All others reviewed and otherwise negative.  Past Medical History:  Diagnosis Date  . Allergic rhinitis   . Asthma   . Atrial fibrillation (HCC)   . CHF (congestive heart failure) (HCC)   . Elevated troponin level not due myocardial infarction 06/13/2015   cath w/ nl cors, felt 2nd HTN, CHF, peat trop 2.73  . Essential hypertension   . Gastroesophageal reflux disease   . History of cardiac catheterization    Normal coronaries December 2016  . Noncompliance    Major problem leading to declining health and recurrent hospitalization  . Nonischemic cardiomyopathy (HCC)    a. LVEF 20-25% in 2017 with cath in 2016 showing normal cors. b. EF 55% by echo in 03/2018.  . On home O2    3L N/C  . OSA (obstructive sleep apnea)   . Osteoarthritis   . Peptic ulcer disease     Past Surgical History:  Procedure Laterality Date  . CARDIAC CATHETERIZATION N/A 06/14/2015   Procedure: Right/Left Heart Cath and Coronary Angiography;  Surgeon: Runell Gess, MD;  Location: [facility] INVASIVE CV LAB;  Service: Cardiovascular;  Laterality: N/A;     reports that he quit smoking about 11 years ago. His smoking use included cigarettes. He started smoking about 30 years ago. He has a 10.00 pack-year smoking history. He has never used smokeless tobacco. He reports that he does not drink alcohol or  use drugs.  Allergies  Allergen Reactions  . Banana Shortness Of Breath  . Bee Venom Shortness Of Breath, Swelling and Other (See Comments)    Reaction:  Facial swelling  . Strawberry Flavor Shortness Of Breath and Rash  . Aspirin Other (See Comments)    Reaction:  GI upset   . Metolazone Other (See Comments)    Pt states that he stopped taking this med due to heart attack like symptoms.   . Oatmeal Nausea And Vomiting  . Orange Juice [Orange Oil] Nausea And Vomiting    All acidic products make him nauseous and upset stomach  .  Torsemide Swelling and Other (See Comments)    Reaction:  Swelling of feet/legs   . Diltiazem Palpitations  . Hydralazine Palpitations  . Lipitor [Atorvastatin] Other (See Comments)    Reaction:  Nose bleeds     Family History  Problem Relation Age of Onset  . Stroke Father   . Heart attack Father   . Aneurysm Mother        Cerebral aneurysm  . Hypertension Sister   . Colon cancer Neg Hx   . Inflammatory bowel disease Neg Hx   . Liver disease Neg Hx     Prior to Admission medications   Medication Sig Start Date End Date Taking? Authorizing Provider  acetaminophen (TYLENOL) 325 MG tablet Take 2 tablets (650 mg total) by mouth every 4 (four) hours as needed for headache or mild pain. 11/02/18   Calvert Cantor, MD  albuterol (PROVENTIL) (2.5 MG/3ML) 0.083% nebulizer solution Take 2.5 mg by nebulization every 6 (six) hours as needed for wheezing or shortness of breath.    [provider]  atorvastatin (LIPITOR) 40 MG tablet Take 40 mg by mouth daily.  09/05/18   [provider]  bumetanide (BUMEX) 2 MG tablet Take 2 mg by mouth 2 (two) times daily.    [provider]  carvedilol (COREG) 25 MG tablet Take 1 tablet (25 mg total) by mouth 2 (two) times daily with a meal for 30 days. Patient taking differently: Take 25 mg by mouth 2 (two) times a day.  08/18/18 11/04/19  Sherryll Burger, Fransisca Shawn D, DO  docusate sodium (COLACE) 100 MG capsule Take 1 capsule (100 mg total) by mouth 2 (two) times daily. Patient taking differently: Take 100 mg by mouth daily as needed for mild constipation or moderate constipation.  06/25/16   Kathlen Mody, MD  ferrous gluconate (FERGON) 324 MG tablet Take 1 tablet (324 mg total) by mouth 2 (two) times daily with a meal. Patient taking differently: Take 324 mg by mouth daily.  06/25/16   Kathlen Mody, MD  fluticasone (FLOVENT HFA) 220 MCG/ACT inhaler Inhale 2 puffs into the lungs daily as needed (for shortness of breath).     [provider]   furosemide (LASIX) 40 MG tablet Take 40 mg by mouth 2 (two) times daily.    [provider]  furosemide (LASIX) 80 MG tablet Take 1.5 tablets (120 mg total) by mouth 2 (two) times daily. 10/07/18 01/05/19  Vassie Loll, MD  lisinopril (ZESTRIL) 5 MG tablet Take 2 tablets (10 mg total) by mouth daily. 11/02/18   Calvert Cantor, MD  OXYGEN Inhale 3.5 L into the lungs continuous.     [provider]  pantoprazole (PROTONIX) 40 MG tablet Take 1 tablet (40 mg total) by mouth daily at 6 (six) AM. Patient taking differently: Take 40 mg by mouth daily.  06/26/16   Kathlen Mody, MD  potassium chloride SA (K-DUR) 20 MEQ tablet Take 2 tablets (40 mEq total) by mouth 2 (two) times daily. 10/07/18   Vassie LollMadera, Carlos, MD  rivaroxaban (XARELTO) 20 MG TABS tablet Take 1 tablet (20 mg total) by mouth daily with breakfast. Patient taking differently: Take 20 mg by mouth daily with supper.  06/26/16   Kathlen ModyAkula, Vijaya, MD  spironolactone (ALDACTONE) 100 MG tablet Take 100 mg by mouth daily. For fluid 03/18/17   [provider]  tamsulosin (FLOMAX) 0.4 MG CAPS capsule Take 1 capsule (0.4 mg total) by mouth daily. 06/25/16   Kathlen ModyAkula, Vijaya, MD  potassium chloride 20 MEQ TBCR Take 20 mEq by mouth 2 (two) times daily. 09/03/15 11/04/18  Ward, Layla MawKristen N, DO    Physical Exam: Vitals:   11/09/18 0830 11/09/18 0845 11/09/18 0900 11/09/18 0939  BP: (!) 149/105 127/89 134/87 103/60  Pulse: (!) 127 (!) 43 (!) 133 (!) 52  Resp: (!) 23 19 (!) 27 18  Temp:    98.4 F (36.9 C)  TempSrc:    Oral  SpO2: 95% 91% 93% 94%  Weight:      Height:        Constitutional: NAD, calm, comfortable, obese Vitals:   11/09/18 0830 11/09/18 0845 11/09/18 0900 11/09/18 0939  BP: (!) 149/105 127/89 134/87 103/60  Pulse: (!) 127 (!) 43 (!) 133 (!) 52  Resp: (!) 23 19 (!) 27 18  Temp:    98.4 F (36.9 C)  TempSrc:    Oral  SpO2: 95% 91% 93% 94%  Weight:      Height:       Eyes: lids and conjunctivae normal ENMT: Mucous  membranes are moist.  Neck: normal, supple Respiratory: clear to auscultation bilaterally. Normal respiratory effort. No accessory muscle use.  Cardiovascular: Regular rate and rhythm, no murmurs.  1-2+ pitting lower extremity edema Abdomen: no tenderness, no distention. Bowel sounds positive.  Musculoskeletal:  No joint deformity upper and lower extremities.   Skin: no rashes, lesions, ulcers.  Psychiatric: Normal judgment and insight. Alert and oriented x 3. Normal mood.   Labs on Admission: I have personally reviewed following labs and imaging studies  CBC: Recent Labs  Lab 11/09/18 0634  WBC 5.1  NEUTROABS 3.3  HGB 12.7*  HCT 40.1  MCV 88.1  PLT 199   Basic Metabolic Panel: Recent Labs  Lab 11/09/18 0634  NA 139  K 3.8  CL 110  CO2 24  GLUCOSE 98  BUN 14  CREATININE 1.04  CALCIUM 8.7*   GFR: Estimated Creatinine Clearance: 140.6 mL/min (by C-G formula based on SCr of 1.04 mg/dL). Liver Function Tests: Recent Labs  Lab 11/09/18 0634  AST 19  ALT 16  ALKPHOS 66  BILITOT 0.7  PROT 6.9  ALBUMIN 3.8   No results for input(s): LIPASE, AMYLASE in the last 168 hours. No results for input(s): AMMONIA in the last 168 hours. Coagulation Profile: No results for input(s): INR, PROTIME in the last 168 hours. Cardiac Enzymes: Recent Labs  Lab 11/09/18 0634  TROPONINI 0.03*   BNP (last 3 results) No results for input(s): PROBNP in the last 8760 hours. HbA1C: No results for input(s): HGBA1C in the last 72 hours. CBG: No results for input(s): GLUCAP in the last 168 hours. Lipid Profile: No results for input(s): CHOL, HDL, LDLCALC, TRIG, CHOLHDL, LDLDIRECT in the last 72 hours. Thyroid Function Tests: No results for input(s): TSH, T4TOTAL, FREET4, T3FREE, THYROIDAB in the last 72 hours. Anemia Panel: No results for  input(s): VITAMINB12, FOLATE, FERRITIN, TIBC, IRON, RETICCTPCT in the last 72 hours. Urine analysis:    Component Value Date/Time   COLORURINE  YELLOW 04/01/2018 0332   APPEARANCEUR CLEAR 04/01/2018 0332   LABSPEC 1.008 04/01/2018 0332   PHURINE 7.0 04/01/2018 0332   GLUCOSEU NEGATIVE 04/01/2018 0332   GLUCOSEU NEG mg/dL 54/27/0623 7628   HGBUR NEGATIVE 04/01/2018 0332   BILIRUBINUR NEGATIVE 04/01/2018 0332   KETONESUR NEGATIVE 04/01/2018 0332   PROTEINUR NEGATIVE 04/01/2018 0332   UROBILINOGEN 0.2 08/08/2014 1445   NITRITE NEGATIVE 04/01/2018 0332   LEUKOCYTESUR NEGATIVE 04/01/2018 0332    Radiological Exams on Admission: Dg Chest 2 View  Result Date: 11/09/2018 CLINICAL DATA:  Shortness of breath for 2 days EXAM: CHEST - 2 VIEW COMPARISON:  10/30/2018 FINDINGS: Cardiac shadow is enlarged. Vascular congestion is noted as well as patchy infiltrate over the mid and lower lung on the right consistent with lower lobe infiltrate. No sizable effusion is noted. No bony abnormality is noted. IMPRESSION: Right lower lobe infiltrate. Mild vascular congestion remains. Electronically Signed   By: Alcide Clever M.D.   On: 11/09/2018 07:26    EKG: Independently reviewed.  Atrial fibrillation 97 bpm.  Assessment/Plan Principal Problem:   Acute on chronic combined systolic and diastolic CHF (congestive heart failure) (HCC) Active Problems:   OBESITY, MORBID   Atrial fibrillation, chronic   OSA (obstructive sleep apnea)   CKD (chronic kidney disease), stage II    Acute on chronic combined CHF with hypertension -Recurrent issue secondary to noncompliance with diet and medications. -Continue on Lasix 80 mg twice daily as well as home spironolactone and monitor daily weights and I's and O's -Goal weight of 360 pounds prior to discharge -Prior echocardiogram on 03/2018 with LVEF 55% and no need to repeat at this time  Possible H CAP -We will check procalcitonin -Maintain on cefepime and vancomycin for now and monitor blood cultures -Strep pneumonia and Legionella pending  Mildly elevated troponin -Likely secondary to above will not  trend at this time with no chest pain  Chronic atrial fibrillation -Monitor on telemetry and maintain on Coreg and Xarelto  OHS/OSA with chronic respiratory failure -Continue on 3 to 3.5 L nasal cannula at baseline and CPAP at night -Patient noncompliant with CPAP  Chronic lymphedema -Noncompliant with treatment  Morbid obesity -Lifestyle modification  CKD stage II -Continue to monitor with diuresis and repeat labs in a.m.   DVT prophylaxis: Xarelto Code Status: Full Family Communication: None available Disposition Plan: Admit for IV diuresis Consults called: None Admission status: Inpatient, telemetry   Emelee Rodocker Hoover Brunette DO [facility] Pager [phone]  If 7PM-7AM, please contact night-coverage [email or web address] Password [email or web address]  11/09/2018, 11:25 AM

## 2018-11-09 NOTE — Progress Notes (Signed)
Pharmacy Antibiotic Note  [name] is a 53 y.o. male admitted on 11/09/2018 with pneumonia.  Pharmacy has been consulted for Vancomycin and Cefepime dosing.  Plan: Vancomycin 2000 mg IV x 1 dose Vancomycin 1500 mg IV every 12 hours.  Goal trough 15-20 mcg/mL.  Cefepime 2000 mg IV every 8 hours. Monitor labs, c/s, and vanco levels as indicated.  Height: 6' (182.9 cm) Weight: (!) 388 lb 4.8 oz (176.1 kg) IBW/kg (Calculated) : 77.6  Temp (24hrs), Avg:98.8 F (37.1 C), Min:98.4 F (36.9 C), Max:99.2 F (37.3 C)  Recent Labs  Lab 11/09/18 0634  WBC 5.1  CREATININE 1.04    Estimated Creatinine Clearance: 140.6 mL/min (by C-G formula based on SCr of 1.04 mg/dL).    Allergies  Allergen Reactions  . Banana Shortness Of Breath  . Bee Venom Shortness Of Breath, Swelling and Other (See Comments)    Reaction:  Facial swelling  . Strawberry Flavor Shortness Of Breath and Rash  . Aspirin Other (See Comments)    Reaction:  GI upset   . Metolazone Other (See Comments)    Pt states that he stopped taking this med due to heart attack like symptoms.   . Oatmeal Nausea And Vomiting  . Orange Juice [Orange Oil] Nausea And Vomiting    All acidic products make him nauseous and upset stomach  . Torsemide Swelling and Other (See Comments)    Reaction:  Swelling of feet/legs   . Diltiazem Palpitations  . Hydralazine Palpitations  . Lipitor [Atorvastatin] Other (See Comments)    Reaction:  Nose bleeds     Antimicrobials this admission: Vanco 5/20 >>  Cefepime 5/20 >>   Dose adjustments this admission: N/A  Microbiology results: 5/20 BCx: pending   5/20 MRSA PCR: pending  Thank you for allowing pharmacy to be a part of this patient's care.  Tad Moore 11/09/2018 10:08 AM

## 2018-11-09 NOTE — ED Triage Notes (Signed)
[facility] - pt c/o SOB that started x 2 days ago. Lung sounds clear. Pt also c/o leg swelling.

## 2018-11-09 NOTE — ED Provider Notes (Signed)
Pt received at sign out with weight and I&O pending. See previous EDP note for full HPI/H&P/MDM. Pt is 52yo M, well known to ED for recurrent visits for SOB d/t noncompliance with HTN/CHF meds and diet. IV lasix and IV hydralazine given, with output 621ml+ urine.  Pt's weight today up 9# since T/C note in Epic dated 11/04/18. BNP elevated from previous. Troponin chronically elevated. CXR today with new infiltrate; IV abx started after [facility] obtained. 1448:  T/C returned from [facility] Sherryll Burger, case discussed, including:  HPI, pertinent PM/SHx, VS/PE, dx testing, ED course and treatment:  Agreeable to admit.   [name] was evaluated in Emergency Department on 11/09/2018 for the symptoms described in the history of present illness. He was evaluated in the context of the global COVID-19 pandemic, which necessitated consideration that the patient might be at risk for infection with the SARS-CoV-2 virus that causes COVID-19. Institutional protocols and algorithms that pertain to the evaluation of patients at risk for COVID-19 are in a state of rapid change based on information released by regulatory bodies including the CDC and federal and state organizations. These policies and algorithms were followed during the patient's care in the ED.   Patient Vitals for the past 24 hrs:  BP Temp Temp src Pulse Resp SpO2 Height Weight  11/09/18 0700 (!) 146/96 - - 60 (!) 27 100 % - -  11/09/18 0640 - - - - - - - (!) 176.1 kg  11/09/18 0625 (!) 175/116 99.2 F (37.3 C) Oral 87 (!) 22 96 % - -  11/09/18 1856 - - - - - - 6' (1.829 m) (!) 171.9 kg     Results for orders placed or performed during the hospital encounter of 11/09/18  CBC with Differential/Platelet  Result Value Ref Range   WBC 5.1 4.0 - 10.5 K/uL   RBC 4.55 4.22 - 5.81 MIL/uL   Hemoglobin 12.7 (L) 13.0 - 17.0 g/dL   HCT 31.4 97.0 - 26.3 %   MCV 88.1 80.0 - 100.0 fL   MCH 27.9 26.0 - 34.0 pg   MCHC 31.7 30.0 - 36.0 g/dL   RDW 78.5 88.5 - 02.7 %   Platelets 199 150 - 400 K/uL   nRBC 0.0 0.0 - 0.2 %   Neutrophils Relative % 66 %   Neutro Abs 3.3 1.7 - 7.7 K/uL   Lymphocytes Relative 23 %   Lymphs Abs 1.2 0.7 - 4.0 K/uL   Monocytes Relative 9 %   Monocytes Absolute 0.5 0.1 - 1.0 K/uL   Eosinophils Relative 2 %   Eosinophils Absolute 0.1 0.0 - 0.5 K/uL   Basophils Relative 0 %   Basophils Absolute 0.0 0.0 - 0.1 K/uL   Immature Granulocytes 0 %   Abs Immature Granulocytes 0.01 0.00 - 0.07 K/uL  Comprehensive metabolic panel  Result Value Ref Range   Sodium 139 135 - 145 mmol/L   Potassium 3.8 3.5 - 5.1 mmol/L   Chloride 110 98 - 111 mmol/L   CO2 24 22 - 32 mmol/L   Glucose, Bld 98 70 - 99 mg/dL   BUN 14 6 - 20 mg/dL   Creatinine, Ser 7.41 0.61 - 1.24 mg/dL   Calcium 8.7 (L) 8.9 - 10.3 mg/dL   Total Protein 6.9 6.5 - 8.1 g/dL   Albumin 3.8 3.5 - 5.0 g/dL   AST 19 15 - 41 U/L   ALT 16 0 - 44 U/L   Alkaline Phosphatase 66 38 - 126 U/L  Total Bilirubin 0.7 0.3 - 1.2 mg/dL   GFR calc non Af Amer >60 >60 mL/min   GFR calc Af Amer >60 >60 mL/min   Anion gap 5 5 - 15  Troponin I - ONCE - STAT  Result Value Ref Range   Troponin I 0.03 (HH) <0.03 ng/mL  Brain natriuretic peptide  Result Value Ref Range   B Natriuretic Peptide 357.0 (H) 0.0 - 100.0 pg/mL   Dg Chest 2 View Result Date: 11/09/2018 CLINICAL DATA:  Shortness of breath for 2 days EXAM: CHEST - 2 VIEW COMPARISON:  10/30/2018 FINDINGS: Cardiac shadow is enlarged. Vascular congestion is noted as well as patchy infiltrate over the mid and lower lung on the right consistent with lower lobe infiltrate. No sizable effusion is noted. No bony abnormality is noted. IMPRESSION: Right lower lobe infiltrate. Mild vascular congestion remains. Electronically Signed   By: Alcide Clever M.D.   On: 11/09/2018 07:26        Samuel Jester, DO 11/09/18 [phone]

## 2018-11-10 ENCOUNTER — Ambulatory Visit: Payer: Self-pay

## 2018-11-10 LAB — CBC
HCT: 40.1 % (ref 39.0–52.0)
Hemoglobin: 12.7 g/dL — ABNORMAL LOW (ref 13.0–17.0)
MCH: 28.2 pg (ref 26.0–34.0)
MCHC: 31.7 g/dL (ref 30.0–36.0)
MCV: 89.1 fL (ref 80.0–100.0)
Platelets: 227 10*3/uL (ref 150–400)
RBC: 4.5 MIL/uL (ref 4.22–5.81)
RDW: 14.4 % (ref 11.5–15.5)
WBC: 4.4 10*3/uL (ref 4.0–10.5)
nRBC: 0 % (ref 0.0–0.2)

## 2018-11-10 LAB — MAGNESIUM: Magnesium: 1.9 mg/dL (ref 1.7–2.4)

## 2018-11-10 LAB — BASIC METABOLIC PANEL
Anion gap: 10 (ref 5–15)
BUN: 16 mg/dL (ref 6–20)
CO2: 26 mmol/L (ref 22–32)
Calcium: 8.5 mg/dL — ABNORMAL LOW (ref 8.9–10.3)
Chloride: 105 mmol/L (ref 98–111)
Creatinine, Ser: 1.19 mg/dL (ref 0.61–1.24)
GFR calc Af Amer: >60 mL/min (ref >60)
GFR calc non Af Amer: >60 mL/min (ref >60)
Glucose, Bld: 89 mg/dL (ref 70–99)
Potassium: 4 mmol/L (ref 3.5–5.1)
Sodium: 141 mmol/L (ref 135–145)

## 2018-11-10 NOTE — Progress Notes (Signed)
Patient had 6 beat run vtach. Patient asymptomatic and denies any chest pain or discomfort. Vital signs are as follows   11/10/18 1907  Vitals  Temp 97.7 F (36.5 C)  Temp Source Oral  BP 103/72  BP Location Left Arm  BP Method Automatic  Patient Position (if appropriate) Lying  Pulse Rate 99  Pulse Rate Source Dinamap  Resp 16  Oxygen Therapy  SpO2 96 %  O2 Device Room Air  Pain Assessment  Pain Scale 0-10  Pain Score 0  MEWS Score  MEWS RR 0  MEWS Pulse 0  MEWS Systolic 0  MEWS LOC 0  MEWS Temp 0  MEWS Score 0  MEWS Score Color Delane Ginger MD E-paged

## 2018-11-10 NOTE — Progress Notes (Signed)
PROGRESS NOTE    [name]  FXJ:[id number] DOB: 1965/08/09 DOA: 11/09/2018 PCP: Pearson Grippe, MD   Brief Narrative:  Per HPI: [name] is a 53 y.o. male with medical history significant for chronic combined systolic and diastolic CHF with recurrent readmissions due to dietary medication noncompliance, atrial fibrillation on Xarelto, hypertension, MI, asthma, OSA on CPAP, and morbid obesity who presents back to the emergency department after recent discharge on 5/13 for acute heart failure exacerbation at that time.  Patient has had worsening shortness of breath along with nonproductive cough and potentially some fevers at home.  His weight at the time of discharge on 5/13 was noted to be 360 pounds and he was noted to be up to 379 pounds on 5/15 during a cardiology telehealth visit.  Today he is noted to be at 388 pounds and continues to have worsening lower extremity swelling and some shortness of breath.  He denies any specific chest pain.  He continuously is consuming high sodium foods to include lots of fish at home and declines taking his usual home medications.  He continues to state that "the medications do not work very well."  This sentiment was well noted on his recent telehealth visit with cardiology on 5/15 and at that time he was recommended to continue spironolactone and try Bumex 2 mg 3 times daily and Lasix was held at that time.  Patient appears to believe that his home generic furosemide does not work and that the IV medications are the only thing that helps him.  Patient was started on IV Lasix twice daily and has been diuresing quite well and doing better on the morning of 5/21.  Assessment & Plan:   Principal Problem:   Acute on chronic combined systolic and diastolic CHF (congestive heart failure) (HCC) Active Problems:   OBESITY, MORBID   Atrial fibrillation, chronic   OSA (obstructive sleep apnea)   CKD (chronic kidney disease), stage II   Acute on chronic  combined CHF with hypertension -Recurrent issue secondary to noncompliance with diet and medications. -Continue on Lasix 80 mg twice daily as well as home spironolactone and monitor daily weights and I's and O's -Goal weight of 360 or 163 kg pounds prior to discharge -Prior echocardiogram on 03/2018 with LVEF 55% and no need to repeat at this time -Current weights appear to be highly inaccurate demonstrating increased weight despite almost 6.8 L fluid diuresis.  I would estimate that his current weight is near 165 kg.  We will continue diuresing for now and anticipate discharge by a.m. when he should be at goal weight.  Possible H CAP -Procalcitonin low -Monitor blood cultures, but will discontinue cefepime today as it does not appear the patient has pneumonia. -Strep pneumonia negative with Legionella pending.  Mildly elevated troponin -Likely secondary to above will not trend at this time with no chest pain  Chronic atrial fibrillation -Monitor on telemetry and maintain on Coreg and Xarelto  OHS/OSA with chronic respiratory failure -Continue on 3 to 3.5 L nasal cannula at baseline and CPAP at night -Patient noncompliant with CPAP  Chronic lymphedema -Noncompliant with treatment  Morbid obesity -Lifestyle modification  CKD stage II -Continue to monitor with diuresis and repeat labs in a.m.   DVT prophylaxis:Xarelto Code Status: Full Family Communication: None at bedside Disposition Plan: Continue IV diuresis and anticipate discharge in the next 24 hours if further improved.   Consultants:   None  Procedures:   None  Antimicrobials:  Vancomycin 5/20-5/20  Cefepime 5/20-5/21   Subjective: Patient seen and evaluated today with no new acute complaints or concerns. No acute concerns or events noted overnight.  His swelling and shortness of breath have improved with diuresis of almost 7 L of fluid overnight.  Objective: Vitals:   11/09/18 2106 11/10/18  0433 11/10/18 0555 11/10/18 0835  BP: 108/71  119/84 131/78  Pulse: (!) 46  73 76  Resp: (!) 24  20 20   Temp: 98 F (36.7 C)  98.9 F (37.2 C)   TempSrc: Oral  Oral   SpO2: 95%  95%   Weight:  (!) 177.1 kg    Height:        Intake/Output Summary (Last 24 hours) at 11/10/2018 0959 Last data filed at 11/10/2018 0841 Gross per 24 hour  Intake 874.74 ml  Output 5750 ml  Net -4875.26 ml   Filed Weights   11/09/18 0621 11/09/18 0640 11/10/18 0433  Weight: (!) 171.9 kg (!) 176.1 kg (!) 177.1 kg    Examination:  General exam: Appears calm and comfortable, obese Respiratory system: Clear to auscultation. Respiratory effort normal. Cardiovascular system: S1 & S2 heard, RRR. No JVD, murmurs, rubs, gallops or clicks.  Scant pedal edema. Gastrointestinal system: Abdomen is nondistended, soft and nontender. No organomegaly or masses felt. Normal bowel sounds heard. Central nervous system: Alert and oriented. No focal neurological deficits. Extremities: Symmetric 5 x 5 power. Skin: No rashes, lesions or ulcers Psychiatry: Judgement and insight appear normal. Mood & affect appropriate.     Data Reviewed: I have personally reviewed following labs and imaging studies  CBC: Recent Labs  Lab 11/09/18 0634 11/10/18 0651  WBC 5.1 4.4  NEUTROABS 3.3  --   HGB 12.7* 12.7*  HCT 40.1 40.1  MCV 88.1 89.1  PLT 199 227   Basic Metabolic Panel: Recent Labs  Lab 11/09/18 0634 11/10/18 0651  NA 139 141  K 3.8 4.0  CL 110 105  CO2 24 26  GLUCOSE 98 89  BUN 14 16  CREATININE 1.04 1.19  CALCIUM 8.7* 8.5*  MG  --  1.9   GFR: Estimated Creatinine Clearance: 123.3 mL/min (by C-G formula based on SCr of 1.19 mg/dL). Liver Function Tests: Recent Labs  Lab 11/09/18 0634  AST 19  ALT 16  ALKPHOS 66  BILITOT 0.7  PROT 6.9  ALBUMIN 3.8   No results for input(s): LIPASE, AMYLASE in the last 168 hours. No results for input(s): AMMONIA in the last 168 hours. Coagulation Profile: No  results for input(s): INR, PROTIME in the last 168 hours. Cardiac Enzymes: Recent Labs  Lab 11/09/18 0634  TROPONINI 0.03*   BNP (last 3 results) No results for input(s): PROBNP in the last 8760 hours. HbA1C: No results for input(s): HGBA1C in the last 72 hours. CBG: Recent Labs  Lab 11/09/18 1105 11/09/18 1601  GLUCAP 77 91   Lipid Profile: No results for input(s): CHOL, HDL, LDLCALC, TRIG, CHOLHDL, LDLDIRECT in the last 72 hours. Thyroid Function Tests: No results for input(s): TSH, T4TOTAL, FREET4, T3FREE, THYROIDAB in the last 72 hours. Anemia Panel: No results for input(s): VITAMINB12, FOLATE, FERRITIN, TIBC, IRON, RETICCTPCT in the last 72 hours. Sepsis Labs: Recent Labs  Lab 11/09/18 0635  PROCALCITON <0.10    Recent Results (from the past 240 hour(s))  SARS Coronavirus 2 (CEPHEID - Performed in [facility] hospital lab), Hosp Order     Status: None   Collection Time: 11/09/18  7:33 AM  Result Value Ref  Range Status   SARS Coronavirus 2 NEGATIVE NEGATIVE Final    Comment: (NOTE) If result is NEGATIVE SARS-CoV-2 target nucleic acids are NOT DETECTED. The SARS-CoV-2 RNA is generally detectable in upper and lower  respiratory specimens during the acute phase of infection. The lowest  concentration of SARS-CoV-2 viral copies this assay can detect is 250  copies / mL. A negative result does not preclude SARS-CoV-2 infection  and should not be used as the sole basis for treatment or other  patient management decisions.  A negative result may occur with  improper specimen collection / handling, submission of specimen other  than nasopharyngeal swab, presence of viral mutation(s) within the  areas targeted by this assay, and inadequate number of viral copies  (<250 copies / mL). A negative result must be combined with clinical  observations, patient history, and epidemiological information. If result is POSITIVE SARS-CoV-2 target nucleic acids are DETECTED. The  SARS-CoV-2 RNA is generally detectable in upper and lower  respiratory specimens dur ing the acute phase of infection.  Positive  results are indicative of active infection with SARS-CoV-2.  Clinical  correlation with patient history and other diagnostic information is  necessary to determine patient infection status.  Positive results do  not rule out bacterial infection or co-infection with other viruses. If result is PRESUMPTIVE POSTIVE SARS-CoV-2 nucleic acids MAY BE PRESENT.   A presumptive positive result was obtained on the submitted specimen  and confirmed on repeat testing.  While 2019 novel coronavirus  (SARS-CoV-2) nucleic acids may be present in the submitted sample  additional confirmatory testing may be necessary for epidemiological  and / or clinical management purposes  to differentiate between  SARS-CoV-2 and other Sarbecovirus currently known to infect humans.  If clinically indicated additional testing with an alternate test  methodology [phone]) is advised. The SARS-CoV-2 RNA is generally  detectable in upper and lower respiratory sp ecimens during the acute  phase of infection. The expected result is Negative. Fact Sheet for Patients:  BoilerBrush.com.cy Fact Sheet for Healthcare Providers: https://pope.com/ This test is not yet approved or cleared by the [place] FDA and has been authorized for detection and/or diagnosis of SARS-CoV-2 by FDA under an Emergency Use Authorization (EUA).  This EUA will remain in effect (meaning this test can be used) for the duration of the COVID-19 declaration under Section 564(b)(1) of the Act, 21 U.S.C. section 360bbb-3(b)(1), unless the authorization is terminated or revoked sooner. Performed at [facility], [address], [place], [place] [address]   Culture, blood (routine x 2)     Status: None (Preliminary result)   Collection Time: 11/09/18  7:51 AM  Result Value Ref  Range Status   Specimen Description BLOOD LEFT ARM  Final   Special Requests   Final    BOTTLES DRAWN AEROBIC AND ANAEROBIC Blood Culture adequate volume   Culture   Final    NO GROWTH < 24 HOURS Performed at [facility], [address] [place], [place] [address]    Report Status PENDING  Incomplete  Culture, blood (routine x 2)     Status: None (Preliminary result)   Collection Time: 11/09/18  7:59 AM  Result Value Ref Range Status   Specimen Description BLOOD RIGHT HAND  Final   Special Requests   Final    BOTTLES DRAWN AEROBIC AND ANAEROBIC Blood Culture adequate volume   Culture   Final    NO GROWTH < 24 HOURS Performed at [facility], [address] [place], [place] [address]    Report Status PENDING  Incomplete  MRSA PCR Screening     Status: None   Collection Time: 11/09/18  8:55 AM  Result Value Ref Range Status   MRSA by PCR NEGATIVE NEGATIVE Final    Comment:        The GeneXpert MRSA Assay (FDA approved for NASAL specimens only), is one component of a comprehensive MRSA colonization surveillance program. It is not intended to diagnose MRSA infection nor to guide or monitor treatment for MRSA infections. Performed at [facility], [address], [place], [place] [address]          Radiology Studies: Dg Chest 2 View  Result Date: 11/09/2018 CLINICAL DATA:  Shortness of breath for 2 days EXAM: CHEST - 2 VIEW COMPARISON:  10/30/2018 FINDINGS: Cardiac shadow is enlarged. Vascular congestion is noted as well as patchy infiltrate over the mid and lower lung on the right consistent with lower lobe infiltrate. No sizable effusion is noted. No bony abnormality is noted. IMPRESSION: Right lower lobe infiltrate. Mild vascular congestion remains. Electronically Signed   By: Alcide Clever M.D.   On: 11/09/2018 07:26        Scheduled Meds: . atorvastatin  40 mg Oral Daily  . carvedilol  25 mg Oral BID  . ferrous gluconate  324 mg Oral BID WC  . furosemide  80 mg  Intravenous Q12H  . pantoprazole  40 mg Oral Daily  . potassium chloride SA  40 mEq Oral BID  . rivaroxaban  20 mg Oral Q breakfast  . sodium chloride flush  3 mL Intravenous Q12H  . spironolactone  100 mg Oral Daily   Continuous Infusions: . sodium chloride       LOS: 1 day    Time spent: 30 minutes    Courtland Reas Hoover Brunette, DO [facility] Pager [phone]  If 7PM-7AM, please contact night-coverage [email or web address] Password [email or web address] 11/10/2018, 9:59 AM

## 2018-11-11 ENCOUNTER — Ambulatory Visit: Payer: Self-pay

## 2018-11-11 LAB — BASIC METABOLIC PANEL
Anion gap: 9 (ref 5–15)
BUN: 18 mg/dL (ref 6–20)
CO2: 30 mmol/L (ref 22–32)
Calcium: 8.7 mg/dL — ABNORMAL LOW (ref 8.9–10.3)
Chloride: 103 mmol/L (ref 98–111)
Creatinine, Ser: 1.17 mg/dL (ref 0.61–1.24)
GFR calc Af Amer: >60 mL/min (ref >60)
GFR calc non Af Amer: >60 mL/min (ref >60)
Glucose, Bld: 87 mg/dL (ref 70–99)
Potassium: 4 mmol/L (ref 3.5–5.1)
Sodium: 142 mmol/L (ref 135–145)

## 2018-11-11 LAB — CBC
HCT: 43.8 % (ref 39.0–52.0)
Hemoglobin: 13.6 g/dL (ref 13.0–17.0)
MCH: 27.6 pg (ref 26.0–34.0)
MCHC: 31.1 g/dL (ref 30.0–36.0)
MCV: 89 fL (ref 80.0–100.0)
Platelets: 229 10*3/uL (ref 150–400)
RBC: 4.92 MIL/uL (ref 4.22–5.81)
RDW: 14.3 % (ref 11.5–15.5)
WBC: 4.2 10*3/uL (ref 4.0–10.5)
nRBC: 0 % (ref 0.0–0.2)

## 2018-11-11 LAB — LEGIONELLA PNEUMOPHILA SEROGP 1 UR AG: L. pneumophila Serogp 1 Ur Ag: NEGATIVE

## 2018-11-11 NOTE — Progress Notes (Signed)
PROGRESS NOTE    [name]  AYO:[id number] DOB: 22-Nov-1965 DOA: 11/09/2018 PCP: Samuel Grippe, MD   Brief Narrative:  Per HPI: Samuel Bruins Archeris a 53 y.o.malewith medical history significant forchronic combined systolic and diastolic CHF with recurrent readmissions due to dietary medication noncompliance, atrial fibrillation on Xarelto, hypertension, MI, asthma, OSA on CPAP, and morbid obesity who presents back to the emergency department after recent discharge on 5/13 for acute heart failure exacerbation at that time. Patient has had worsening shortness of breath along with nonproductive cough and potentially some fevers at home. His weight at the time of discharge on 5/13 was noted to be 360 pounds and he was noted to be up to 379 pounds on 5/15 during a cardiology telehealth visit. Today he is noted to be at 388 pounds and continues to have worsening lower extremity swelling and some shortness of breath. He denies any specific chest pain. He continuously is consuming high sodium foods to include lots of fish at home and declines taking his usual home medications. He continues to state that "the medications do not work very well." This sentiment was well noted on his recent telehealth visit with cardiology on 5/15 and at that time he was recommended to continue spironolactone and try Bumex 2 mg 3 times daily and Lasix was held at that time. Patient appears to believe that his home generic furosemide does not work and that the IV medications are the only thing that helps him.  Patient was started on IV Lasix twice daily and has been diuresing quite well and doing better on the morning of 5/21.  He has continued to diurese with near 5 L of further diuresis noted overnight on 5/22.   Assessment & Plan:   Principal Problem:   Acute on chronic combined systolic and diastolic CHF (congestive heart failure) (HCC) Active Problems:   OBESITY, MORBID   Atrial fibrillation, chronic   OSA  (obstructive sleep apnea)   CKD (chronic kidney disease), stage II  Acute on chronic combined CHF with hypertension-improving -Recurrent issue secondary to noncompliance with diet and medications. -Continue on Lasix 80 mg twice daily as well as home spironolactone and monitor daily weights and I's and O's -Goal weight uncertain as he was 163kg on recent discharge and is near 160kg today based on calculations of fluid balance. He is with total over 11.6 L of net negative fluid balance noted thus far. -Prior echocardiogram on 03/2018 with LVEF 55% and no need to repeat at this time -Current weights appear to be highly inaccurate demonstrating increased weight despite almost 11 L fluid diuresis.  I would estimate that his current weight is near 160 kg.  We will continue diuresing for now and anticipate discharge by a.m. once he is fully diuresed  Possible H CAP -Procalcitonin low -Monitor blood cultures, but discontinued cefepime 5/21 as it does not appear the patient has pneumonia. -Strep pneumonia negative.  Mildly elevated troponin -Likely secondary to above will not trend at this time with no chest pain  Chronic atrial fibrillation -Monitor on telemetry and maintain on Coreg and Xarelto  OHS/OSA with chronic respiratory failure -Continue on 3 to 3.5 L nasal cannula at baseline and CPAP at night -Patient noncompliant with CPAP  Chronic lymphedema -Noncompliant with treatment  Morbid obesity -Lifestyle modification  CKD stage II -Continue to monitor with diuresis and repeat labs in a.m.   DVT prophylaxis:Xarelto Code Status: Full Family Communication: None at bedside Disposition Plan: Continue IV diuresis and anticipate  discharge in the next 24 hours if further improved.   Consultants:   None  Procedures:   None  Antimicrobials:   Vancomycin 5/20-5/20  Cefepime 5/20-5/21  Subjective: Patient seen and evaluated today with no new acute complaints or  concerns. No acute concerns or events noted overnight.  He has less swelling and shortness of breath and is starting to feel better day by day.  He is still not quite fully diuresed or at baseline.  Objective: Vitals:   11/10/18 1907 11/10/18 2216 11/11/18 0500 11/11/18 0600  BP: 103/72 (!) 134/92  120/89  Pulse: 99 66  91  Resp: 16 (!) 24  20  Temp: 97.7 F (36.5 C) 98 F (36.7 C)  98.7 F (37.1 C)  TempSrc: Oral Oral  Oral  SpO2: 96% 95%  94%  Weight:   (!) 165.6 kg   Height:        Intake/Output Summary (Last 24 hours) at 11/11/2018 0905 Last data filed at 11/11/2018 0841 Gross per 24 hour  Intake 480 ml  Output 5680 ml  Net -5200 ml   Filed Weights   11/09/18 0640 11/10/18 0433 11/11/18 0500  Weight: (!) 176.1 kg (!) 177.1 kg (!) 165.6 kg    Examination:  General exam: Appears calm and comfortable, obese Respiratory system: Clear to auscultation. Respiratory effort normal. Cardiovascular system: S1 & S2 heard, RRR. No JVD, murmurs, rubs, gallops or clicks.  Scant pedal edema. Gastrointestinal system: Abdomen is nondistended, soft and nontender. No organomegaly or masses felt. Normal bowel sounds heard. Central nervous system: Alert and oriented. No focal neurological deficits. Extremities: Symmetric 5 x 5 power. Skin: No rashes, lesions or ulcers Psychiatry: Judgement and insight appear normal. Mood & affect appropriate.     Data Reviewed: I have personally reviewed following labs and imaging studies  CBC: Recent Labs  Lab 11/09/18 0634 11/10/18 0651 11/11/18 0457  WBC 5.1 4.4 4.2  NEUTROABS 3.3  --   --   HGB 12.7* 12.7* 13.6  HCT 40.1 40.1 43.8  MCV 88.1 89.1 89.0  PLT 199 227 229   Basic Metabolic Panel: Recent Labs  Lab 11/09/18 0634 11/10/18 0651 11/11/18 0457  NA 139 141 142  K 3.8 4.0 4.0  CL 110 105 103  CO2 GLUCOSE 98 89 87  BUN CREATININE 1.04 1.19 1.17  CALCIUM 8.7* 8.5* 8.7*  MG  --  1.9  --    GFR: Estimated  Creatinine Clearance: 120.5 mL/min (by C-G formula based on SCr of 1.17 mg/dL). Liver Function Tests: Recent Labs  Lab 11/09/18 0634  AST 19  ALT 16  ALKPHOS 66  BILITOT 0.7  PROT 6.9  ALBUMIN 3.8   No results for input(s): LIPASE, AMYLASE in the last 168 hours. No results for input(s): AMMONIA in the last 168 hours. Coagulation Profile: No results for input(s): INR, PROTIME in the last 168 hours. Cardiac Enzymes: Recent Labs  Lab 11/09/18 0634  TROPONINI 0.03*   BNP (last 3 results) No results for input(s): PROBNP in the last 8760 hours. HbA1C: No results for input(s): HGBA1C in the last 72 hours. CBG: Recent Labs  Lab 11/09/18 1105 11/09/18 1601  GLUCAP 77 91   Lipid Profile: No results for input(s): CHOL, HDL, LDLCALC, TRIG, CHOLHDL, LDLDIRECT in the last 72 hours. Thyroid Function Tests: No results for input(s): TSH, T4TOTAL, FREET4, T3FREE, THYROIDAB in the last 72 hours. Anemia Panel: No results for input(s): VITAMINB12, FOLATE, FERRITIN, TIBC,  IRON, RETICCTPCT in the last 72 hours. Sepsis Labs: Recent Labs  Lab 11/09/18 0635  PROCALCITON <0.10    Recent Results (from the past 240 hour(s))  SARS Coronavirus 2 (CEPHEID - Performed in [facility] hospital lab), Hosp Order     Status: None   Collection Time: 11/09/18  7:33 AM  Result Value Ref Range Status   SARS Coronavirus 2 NEGATIVE NEGATIVE Final    Comment: (NOTE) If result is NEGATIVE SARS-CoV-2 target nucleic acids are NOT DETECTED. The SARS-CoV-2 RNA is generally detectable in upper and lower  respiratory specimens during the acute phase of infection. The lowest  concentration of SARS-CoV-2 viral copies this assay can detect is 250  copies / mL. A negative result does not preclude SARS-CoV-2 infection  and should not be used as the sole basis for treatment or other  patient management decisions.  A negative result may occur with  improper specimen collection / handling, submission of specimen  other  than nasopharyngeal swab, presence of viral mutation(s) within the  areas targeted by this assay, and inadequate number of viral copies  (<250 copies / mL). A negative result must be combined with clinical  observations, patient history, and epidemiological information. If result is POSITIVE SARS-CoV-2 target nucleic acids are DETECTED. The SARS-CoV-2 RNA is generally detectable in upper and lower  respiratory specimens dur ing the acute phase of infection.  Positive  results are indicative of active infection with SARS-CoV-2.  Clinical  correlation with patient history and other diagnostic information is  necessary to determine patient infection status.  Positive results do  not rule out bacterial infection or co-infection with other viruses. If result is PRESUMPTIVE POSTIVE SARS-CoV-2 nucleic acids MAY BE PRESENT.   A presumptive positive result was obtained on the submitted specimen  and confirmed on repeat testing.  While 2019 novel coronavirus  (SARS-CoV-2) nucleic acids may be present in the submitted sample  additional confirmatory testing may be necessary for epidemiological  and / or clinical management purposes  to differentiate between  SARS-CoV-2 and other Sarbecovirus currently known to infect humans.  If clinically indicated additional testing with an alternate test  methodology [phone]) is advised. The SARS-CoV-2 RNA is generally  detectable in upper and lower respiratory sp ecimens during the acute  phase of infection. The expected result is Negative. Fact Sheet for Patients:  BoilerBrush.com.cy Fact Sheet for Healthcare Providers: https://pope.com/ This test is not yet approved or cleared by the [place] FDA and has been authorized for detection and/or diagnosis of SARS-CoV-2 by FDA under an Emergency Use Authorization (EUA).  This EUA will remain in effect (meaning this test can be used) for the duration  of the COVID-19 declaration under Section 564(b)(1) of the Act, 21 U.S.C. section 360bbb-3(b)(1), unless the authorization is terminated or revoked sooner. Performed at [facility], [address], [place], [place] [address]   Culture, blood (routine x 2)     Status: None (Preliminary result)   Collection Time: 11/09/18  7:51 AM  Result Value Ref Range Status   Specimen Description BLOOD LEFT ARM  Final   Special Requests   Final    BOTTLES DRAWN AEROBIC AND ANAEROBIC Blood Culture adequate volume   Culture   Final    NO GROWTH 2 DAYS Performed at [facility], [address] [place], [place] [address]    Report Status PENDING  Incomplete  Culture, blood (routine x 2)     Status: None (Preliminary result)   Collection Time:  11/09/18  7:59 AM  Result Value Ref Range Status   Specimen Description BLOOD RIGHT HAND  Final   Special Requests   Final    BOTTLES DRAWN AEROBIC AND ANAEROBIC Blood Culture adequate volume   Culture   Final    NO GROWTH 2 DAYS Performed at [facility], [address], [place], [place] [address]    Report Status PENDING  Incomplete  MRSA PCR Screening     Status: None   Collection Time: 11/09/18  8:55 AM  Result Value Ref Range Status   MRSA by PCR NEGATIVE NEGATIVE Final    Comment:        The GeneXpert MRSA Assay (FDA approved for NASAL specimens only), is one component of a comprehensive MRSA colonization surveillance program. It is not intended to diagnose MRSA infection nor to guide or monitor treatment for MRSA infections. Performed at [facility], [address] [place], [place] [address]          Radiology Studies: No results found.      Scheduled Meds: . atorvastatin  40 mg Oral Daily  . carvedilol  25 mg Oral BID  . ferrous gluconate  324 mg Oral BID WC  . furosemide  80 mg Intravenous Q12H  . pantoprazole  40 mg Oral Daily  . potassium chloride SA  40 mEq Oral BID  . rivaroxaban  20 mg Oral Q breakfast  . sodium  chloride flush  3 mL Intravenous Q12H  . spironolactone  100 mg Oral Daily   Continuous Infusions: . sodium chloride       LOS: 2 days    Time spent: 30 minutes    Pratik Hoover Brunette, DO [facility] Pager [phone]  If 7PM-7AM, please contact night-coverage [email or web address] Password [email or web address] 11/11/2018, 9:05 AM

## 2018-11-11 NOTE — Care Management Important Message (Signed)
Important Message  Patient Details  Name: [name] MRN: [id number] Date of Birth: 08-25-1965   Medicare Important Message Given:  Yes    Corey Harold 11/11/2018, 4:15 PM

## 2018-11-11 NOTE — TOC Initial Note (Signed)
Transition of Care [facility]) - Initial/Assessment Note    Patient Details  Name: [name] MRN: [id number] Date of Birth: March 22, 1966  Transition of Care [facility]) CM/SW Contact:    [name], RN Phone Number: 11/11/2018, 9:39 AM  Clinical Narrative:   CHF exacerbation. Pt well known to this TOC team. Pt active with [facility] services in the community and followed closely. In pt TOC team feel pt needs referral for OP psych f/u for depression and anxiety. Will communicate recommendation to [facility] rep.                Expected Discharge Plan: Home/Self Care      Expected Discharge Plan and Services Expected Discharge Plan: Home/Self Care         Prior Living Arrangements/Services   Lives with:: Parents Patient language and need for interpreter reviewed:: Yes Do you feel safe going back to the place where you live?: Yes      Need for Family Participation in Patient Care: Yes (Comment) Care giver support system in place?: Yes (comment)   Criminal Activity/Legal Involvement Pertinent to Current Situation/Hospitalization: No - Comment as needed  Activities of Daily Living Home Assistive Devices/Equipment: None ADL Screening (condition at time of admission) Patient's cognitive ability adequate to safely complete daily activities?: Yes Is the patient deaf or have difficulty hearing?: No Does the patient have difficulty seeing, even when wearing glasses/contacts?: No Does the patient have difficulty concentrating, remembering, or making decisions?: No Patient able to express need for assistance with ADLs?: Yes Does the patient have difficulty dressing or bathing?: No Independently performs ADLs?: Yes (appropriate for developmental age) Does the patient have difficulty walking or climbing stairs?: Yes Weakness of Legs: Both Weakness of Arms/Hands: None     Emotional Assessment       Orientation: : Oriented to Self, Oriented to  Time, Oriented to Place, Oriented to  Situation Alcohol / Substance Use: Not Applicable    Admission diagnosis:  Shortness of breath [R06.02] Noncompliance [Z91.19] HCAP (healthcare-associated pneumonia) [J18.9] Acute on chronic congestive heart failure, unspecified heart failure type [facility]) [I50.9] Patient Active Problem List   Diagnosis Date Noted  . Chronic obstructive pulmonary disease (HCC)   . Benign prostatic hyperplasia without lower urinary tract symptoms   . Acute on chronic combined systolic and diastolic CHF (congestive heart failure) (HCC) 10/02/2018  . AKI (acute kidney injury) (HCC) 09/18/2018  . Acute on chronic combined systolic and diastolic congestive heart failure (HCC) 08/15/2018  . SOB (shortness of breath) 08/03/2018  . Volume overload 07/22/2018  . Acute systolic CHF (congestive heart failure) (HCC) 07/04/2018  . Acute on chronic combined systolic and diastolic heart failure (HCC) 04/01/2018  . Hypomagnesemia 04/01/2018  . Acute on chronic diastolic CHF (congestive heart failure) (HCC) 03/11/2018  . Pressure injury of skin 03/11/2018  . CHF (congestive heart failure) (HCC) 09/08/2017  . Altered mental status 05/11/2017  . Acute exacerbation of CHF (congestive heart failure) (HCC) 04/20/2017  . Acute on chronic systolic (congestive) heart failure (HCC) 04/19/2017  . CHF exacerbation (HCC) 04/11/2017  . Chronic respiratory failure with hypoxia (HCC) 04/11/2017  . Encounter for hospice care discussion   . Palliative care encounter   . Goals of care, counseling/discussion   . DNR (do not resuscitate)   . DNR (do not resuscitate) discussion   . Chronic congestive heart failure (HCC) 02/18/2017  . Acute systolic heart failure (HCC) 01/18/2017  . Pedal edema 12/02/2016  . Acute CHF (congestive heart failure) (HCC)  11/09/2016  . Acute on chronic systolic CHF (congestive heart failure) (HCC) 08/19/2016  . CKD (chronic kidney disease), stage II 05/17/2016  . Coronary arteries, normal Dec 2016  01/23/2016  . Chronic anticoagulation-Xarelto 01/23/2016  . NSVT (nonsustained ventricular tachycardia) (HCC) 01/23/2016  . Elevated troponin 12/21/2015  . Nonischemic cardiomyopathy (HCC)   . Morbid obesity due to excess calories (HCC)   . Noncompliance with diet and medication regimen 12/02/2015  . OSA (obstructive sleep apnea) 09/20/2015  . Pulmonary hypertension (HCC) 09/19/2015  . Normocytic anemia 09/19/2015  . Atrial fibrillation, chronic   . Dyspnea on exertion 05/28/2015  . Acute respiratory failure with hypoxia (HCC) 04/01/2015  . Hypokalemia 04/01/2015  . Obesity hypoventilation syndrome (HCC) 08/08/2014  . GERD (gastroesophageal reflux disease) 08/08/2014  . Edema of right lower extremity 06/21/2014  . Fecal urgency 06/21/2014  . Asthma 02/11/2009  . Hyperlipidemia 07/12/2008  . OBESITY, MORBID 07/12/2008  . Anxiety state 07/12/2008  . DEPRESSION 07/12/2008  . Essential hypertension 07/12/2008  . ALLERGIC RHINITIS 07/12/2008  . Peptic ulcer 07/12/2008  . OSTEOARTHRITIS 07/12/2008   PCP:  Pearson Grippe, MD Pharmacy:   [facility] [phone] - [place], [place] - [address] [place] [address] Phone: [phone] Fax: [phone]     Social Determinants of Health (SDOH) Interventions    Readmission Risk Interventions Readmission Risk Prevention Plan 11/11/2018 11/02/2018 10/07/2018  Transportation Screening Complete Complete Complete  Medication Review (RN Care Manager) Complete Complete Complete  PCP or Specialist appointment within 3-5 days of discharge - Complete Complete  HRI or Home Care Consult Not Complete Not Complete -  HRI or Home Care Consult Pt Refusal Comments not ordered, no HH company will accept pt any longer not ordered nor recommended -  SW Recovery Care/Counseling Consult Complete Complete Complete  Palliative Care Screening Not Applicable Not Applicable Not Applicable  Skilled Nursing Facility  Not Applicable Not Applicable Not Applicable  Some recent data might be hidden

## 2018-11-12 LAB — BASIC METABOLIC PANEL
Anion gap: 12 (ref 5–15)
BUN: 19 mg/dL (ref 6–20)
CO2: 27 mmol/L (ref 22–32)
Calcium: 9 mg/dL (ref 8.9–10.3)
Chloride: 102 mmol/L (ref 98–111)
Creatinine, Ser: 1.3 mg/dL — ABNORMAL HIGH (ref 0.61–1.24)
GFR calc Af Amer: >60 mL/min (ref >60)
GFR calc non Af Amer: >60 mL/min (ref >60)
Glucose, Bld: 97 mg/dL (ref 70–99)
Potassium: 3.9 mmol/L (ref 3.5–5.1)
Sodium: 141 mmol/L (ref 135–145)

## 2018-11-12 LAB — MAGNESIUM: Magnesium: 2 mg/dL (ref 1.7–2.4)

## 2018-11-12 MED ORDER — BUMETANIDE 2 MG PO TABS: 2.0000 mg | ORAL_TABLET | Freq: Three times a day (TID) | ORAL | 3 refills | Status: DC

## 2018-11-12 NOTE — Discharge Summary (Signed)
Physician Discharge Summary  [name] ZOX:[id number] DOB: 08/03/1965 DOA: 11/09/2018  PCP: Pearson Grippe, MD  Admit date: 11/09/2018  Discharge date: 11/12/2018  Admitted From:Home  Disposition:  Home  Recommendations for Outpatient Follow-up:  1. Follow up with PCP in 1-2 weeks 2. Follow-up with cardiology outpatient as scheduled 3. Remain on low-sodium diet with fluid restriction as recommended 4. Remain on medications as prescribed below with Bumex now 3 times daily and no further Lasix as recommended by last cardiology encounter on 5/15 5. Patient discharge weight 163 kg with total approximately 12.6 L diuresis during this admission with use of Lasix 80 mg IV twice daily  Home Health: None  Equipment/Devices: None  Discharge Condition: Stable  CODE STATUS: Full  Diet recommendation: Heart Healthy  Brief/Interim Summary: Per HPI: Samuel T Archeris a 52 y.o.malewith medical history significant forchronic combined systolic and diastolic CHF with recurrent readmissions due to dietary medication noncompliance, atrial fibrillation on Xarelto, hypertension, MI, asthma, OSA on CPAP, and morbid obesity who presents back to the emergency department after recent discharge on 5/13 for acute heart failure exacerbation at that time. Patient has had worsening shortness of breath along with nonproductive cough and potentially some fevers at home. His weight at the time of discharge on 5/13 was noted to be 360 pounds and he was noted to be up to 379 pounds on 5/15 during a cardiology telehealth visit. Today he is noted to be at 388 pounds and continues to have worsening lower extremity swelling and some shortness of breath. He denies any specific chest pain. He continuously is consuming high sodium foods to include lots of fish at home and declines taking his usual home medications. He continues to state that "the medications do not work very well." This sentiment was well noted on his  recent telehealth visit with cardiology on 5/15 and at that time he was recommended to continue spironolactone and try Bumex 2 mg 3 times daily and Lasix was held at that time. Patient appears to believe that his home generic furosemide does not work and that the IV medications are the only thing that helps him.  Patient was started on IV Lasix twice daily and has diuresed approximately 12.5 L of fluid with discharge weight down to 163 kg.  His creatinine is started to elevate up to 1.3 today and he will be resumed on home medications to include spironolactone and Bumex 3 times daily as recommended by cardiology encounter on 5/15.  He states that he will have low-sodium meals delivered to him at home and will watch his fluid intake and monitor daily weights.  Hopefully, he will become more compliant with his home medications and diet.  He is a high risk for readmission and continues to have frequent readmissions.  No other acute events noted during the course of this admission.  Discharge Diagnoses:  Principal Problem:   Acute on chronic combined systolic and diastolic CHF (congestive heart failure) (HCC) Active Problems:   OBESITY, MORBID   Atrial fibrillation, chronic   OSA (obstructive sleep apnea)   CKD (chronic kidney disease), stage II  Principal discharge diagnosis: Acute on chronic combined systolic and diastolic CHF secondary to medication and dietary noncompliance.  Discharge Instructions  Discharge Instructions    Diet - low sodium heart healthy   Complete by:  As directed    Increase activity slowly   Complete by:  As directed      Allergies as of 11/12/2018  Reactions   Banana Shortness Of Breath   Bee Venom Shortness Of Breath, Swelling, Other (See Comments)   Reaction:  Facial swelling   Strawberry Flavor Shortness Of Breath, Rash   Aspirin Other (See Comments)   Reaction:  GI upset    Metolazone Other (See Comments)   Pt states that he stopped taking this med due  to heart attack like symptoms.    Oatmeal Nausea And Vomiting   Orange Juice [orange Oil] Nausea And Vomiting   All acidic products make him nauseous and upset stomach   Torsemide Swelling, Other (See Comments)   Reaction:  Swelling of feet/legs    Diltiazem Palpitations   Hydralazine Palpitations   Lipitor [atorvastatin] Other (See Comments)   Reaction:  Nose bleeds       Medication List    STOP taking these medications   furosemide 40 MG tablet Commonly known as:  LASIX   furosemide 80 MG tablet Commonly known as:  LASIX   lisinopril 5 MG tablet Commonly known as:  ZESTRIL     TAKE these medications   acetaminophen 325 MG tablet Commonly known as:  TYLENOL Take 2 tablets (650 mg total) by mouth every 4 (four) hours as needed for headache or mild pain.   albuterol (2.5 MG/3ML) 0.083% nebulizer solution Commonly known as:  PROVENTIL Take 2.5 mg by nebulization every 6 (six) hours as needed for wheezing or shortness of breath.   atorvastatin 40 MG tablet Commonly known as:  LIPITOR Take 40 mg by mouth daily.   bumetanide 2 MG tablet Commonly known as:  BUMEX Take 1 tablet (2 mg total) by mouth 3 (three) times daily for 30 days. What changed:  when to take this   carvedilol 25 MG tablet Commonly known as:  COREG Take 1 tablet (25 mg total) by mouth 2 (two) times daily with a meal for 30 days. What changed:  when to take this   docusate sodium 100 MG capsule Commonly known as:  COLACE Take 1 capsule (100 mg total) by mouth 2 (two) times daily. What changed:    when to take this  reasons to take this   ferrous gluconate 324 MG tablet Commonly known as:  FERGON Take 1 tablet (324 mg total) by mouth 2 (two) times daily with a meal. What changed:  when to take this   fluticasone 220 MCG/ACT inhaler Commonly known as:  FLOVENT HFA Inhale 2 puffs into the lungs daily as needed (for shortness of breath).   OXYGEN Inhale 3.5 L into the lungs continuous.    pantoprazole 40 MG tablet Commonly known as:  PROTONIX Take 1 tablet (40 mg total) by mouth daily at 6 (six) AM. What changed:  when to take this   potassium chloride SA 20 MEQ tablet Commonly known as:  K-DUR Take 2 tablets (40 mEq total) by mouth 2 (two) times daily.   rivaroxaban 20 MG Tabs tablet Commonly known as:  XARELTO Take 1 tablet (20 mg total) by mouth daily with breakfast. What changed:  when to take this   spironolactone 100 MG tablet Commonly known as:  ALDACTONE Take 100 mg by mouth daily. For fluid   tamsulosin 0.4 MG Caps capsule Commonly known as:  FLOMAX Take 1 capsule (0.4 mg total) by mouth daily.      Follow-up Information    Pearson Grippe, MD Follow up in 1 week(s).   Specialty:  Internal Medicine Contact information: [address] [place]  [address] [phone]        [name], MD .   Specialty:  Cardiology Contact information: [phone]  [place] [address] [phone]          Allergies  Allergen Reactions  . Banana Shortness Of Breath  . Bee Venom Shortness Of Breath, Swelling and Other (See Comments)    Reaction:  Facial swelling  . Strawberry Flavor Shortness Of Breath and Rash  . Aspirin Other (See Comments)    Reaction:  GI upset   . Metolazone Other (See Comments)    Pt states that he stopped taking this med due to heart attack like symptoms.   . Oatmeal Nausea And Vomiting  . Orange Juice [Orange Oil] Nausea And Vomiting    All acidic products make him nauseous and upset stomach  . Torsemide Swelling and Other (See Comments)    Reaction:  Swelling of feet/legs   . Diltiazem Palpitations  . Hydralazine Palpitations  . Lipitor [Atorvastatin] Other (See Comments)    Reaction:  Nose bleeds     Consultations:  None   Procedures/Studies: Dg Chest 2 View  Result Date: 11/09/2018 CLINICAL DATA:  Shortness of breath for 2 days EXAM: CHEST - 2 VIEW COMPARISON:  10/30/2018 FINDINGS:  Cardiac shadow is enlarged. Vascular congestion is noted as well as patchy infiltrate over the mid and lower lung on the right consistent with lower lobe infiltrate. No sizable effusion is noted. No bony abnormality is noted. IMPRESSION: Right lower lobe infiltrate. Mild vascular congestion remains. Electronically Signed   By: Alcide Clever M.D.   On: 11/09/2018 07:26   Dg Chest 2 View  Result Date: 10/24/2018 CLINICAL DATA:  Shortness of breath. EXAM: CHEST - 2 VIEW COMPARISON:  Radiograph of October 02, 2018. FINDINGS: Stable cardiomegaly with central pulmonary vascular congestion. No pneumothorax or pleural effusion is noted. No acute pulmonary disease is noted. Bony thorax is unremarkable. IMPRESSION: Stable cardiomegaly with central pulmonary vascular congestion Electronically Signed   By: Lupita Raider M.D.   On: 10/24/2018 13:15   Dg Chest Portable 1 View  Result Date: 10/30/2018 CLINICAL DATA:  Short of breath. EXAM: PORTABLE CHEST 1 VIEW COMPARISON:  10/24/2018 FINDINGS: Stable enlarged cardiac silhouette. Mild central venous congestion. No overt pulmonary edema. No pneumothorax. IMPRESSION: Cardiomegaly and central venous congestion. Electronically Signed   By: Genevive Bi M.D.   On: 10/30/2018 06:34    Discharge Exam: Vitals:   11/11/18 2202 11/12/18 0506  BP: 125/84 (!) 125/93  Pulse: (!) 51 70  Resp: 16 16  Temp: 98.1 F (36.7 C) 98.5 F (36.9 C)  SpO2: 95% 92%   Vitals:   11/11/18 1445 11/11/18 2042 11/11/18 2202 11/12/18 0506  BP: 117/79  125/84 (!) 125/93  Pulse: 74 (!) 54 (!) 51 70  Resp: Temp:   98.1 F (36.7 C) 98.5 F (36.9 C)  TempSrc:   Oral Oral  SpO2:  94% 95% 92%  Weight:    (!) 162.8 kg  Height:        General: Pt is alert, awake, not in acute distress, obese Cardiovascular: RRR, S1/S2 +, no rubs, no gallops Respiratory: CTA bilaterally, no wheezing, no rhonchi Abdominal: Soft, NT, ND, bowel sounds + Extremities: no edema, no  cyanosis    The results of significant diagnostics from this hospitalization (including imaging, microbiology, ancillary and laboratory) are listed below for reference.     Microbiology: Recent Results (from the past 240 hour(s))  SARS Coronavirus 2 (CEPHEID - Performed in [facility] hospital lab), Hosp Order     Status: None   Collection Time: 11/09/18  7:33 AM  Result Value Ref Range Status   SARS Coronavirus 2 NEGATIVE NEGATIVE Final    Comment: (NOTE) If result is NEGATIVE SARS-CoV-2 target nucleic acids are NOT DETECTED. The SARS-CoV-2 RNA is generally detectable in upper and lower  respiratory specimens during the acute phase of infection. The lowest  concentration of SARS-CoV-2 viral copies this assay can detect is 250  copies / mL. A negative result does not preclude SARS-CoV-2 infection  and should not be used as the sole basis for treatment or other  patient management decisions.  A negative result may occur with  improper specimen collection / handling, submission of specimen other  than nasopharyngeal swab, presence of viral mutation(s) within the  areas targeted by this assay, and inadequate number of viral copies  (<250 copies / mL). A negative result must be combined with clinical  observations, patient history, and epidemiological information. If result is POSITIVE SARS-CoV-2 target nucleic acids are DETECTED. The SARS-CoV-2 RNA is generally detectable in upper and lower  respiratory specimens dur ing the acute phase of infection.  Positive  results are indicative of active infection with SARS-CoV-2.  Clinical  correlation with patient history and other diagnostic information is  necessary to determine patient infection status.  Positive results do  not rule out bacterial infection or co-infection with other viruses. If result is PRESUMPTIVE POSTIVE SARS-CoV-2 nucleic acids MAY BE PRESENT.   A presumptive positive result was obtained on the submitted specimen   and confirmed on repeat testing.  While 2019 novel coronavirus  (SARS-CoV-2) nucleic acids may be present in the submitted sample  additional confirmatory testing may be necessary for epidemiological  and / or clinical management purposes  to differentiate between  SARS-CoV-2 and other Sarbecovirus currently known to infect humans.  If clinically indicated additional testing with an alternate test  methodology [phone]) is advised. The SARS-CoV-2 RNA is generally  detectable in upper and lower respiratory sp ecimens during the acute  phase of infection. The expected result is Negative. Fact Sheet for Patients:  BoilerBrush.com.cy Fact Sheet for Healthcare Providers: https://pope.com/ This test is not yet approved or cleared by the [place] FDA and has been authorized for detection and/or diagnosis of SARS-CoV-2 by FDA under an Emergency Use Authorization (EUA).  This EUA will remain in effect (meaning this test can be used) for the duration of the COVID-19 declaration under Section 564(b)(1) of the Act, 21 U.S.C. section 360bbb-3(b)(1), unless the authorization is terminated or revoked sooner. Performed at [facility], [address] [place], [place] [address]   Culture, blood (routine x 2)     Status: None (Preliminary result)   Collection Time: 11/09/18  7:51 AM  Result Value Ref Range Status   Specimen Description BLOOD LEFT ARM  Final   Special Requests   Final    BOTTLES DRAWN AEROBIC AND ANAEROBIC Blood Culture adequate volume   Culture   Final    NO GROWTH 2 DAYS Performed at [facility], [address], [place], [place] [address]    Report Status PENDING  Incomplete  Culture, blood (routine x 2)     Status: None (Preliminary result)   Collection Time: 11/09/18  7:59 AM  Result Value Ref Range Status   Specimen Description BLOOD RIGHT HAND  Final   Special Requests   Final    BOTTLES  DRAWN AEROBIC AND ANAEROBIC Blood  Culture adequate volume   Culture   Final    NO GROWTH 2 DAYS Performed at [facility], [address] [place], [place] [address]    Report Status PENDING  Incomplete  MRSA PCR Screening     Status: None   Collection Time: 11/09/18  8:55 AM  Result Value Ref Range Status   MRSA by PCR NEGATIVE NEGATIVE Final    Comment:        The GeneXpert MRSA Assay (FDA approved for NASAL specimens only), is one component of a comprehensive MRSA colonization surveillance program. It is not intended to diagnose MRSA infection nor to guide or monitor treatment for MRSA infections. Performed at [facility], [address] [place], [place] [address]      Labs: BNP (last 3 results) Recent Labs    10/24/18 1217 10/30/18 0605 11/09/18 0635  BNP 275.0* 263.0* 357.0*   Basic Metabolic Panel: Recent Labs  Lab 11/09/18 0634 11/10/18 0651 11/11/18 0457 11/12/18 0650  NA 139 141 142 141  K 3.8 4.0 4.0 3.9  CL 110 105 103 102  CO2 24 26 30 27   GLUCOSE 98 89 87 97  BUN 14 16 18 19   CREATININE 1.04 1.19 1.17 1.30*  CALCIUM 8.7* 8.5* 8.7* 9.0  MG  --  1.9  --  2.0   Liver Function Tests: Recent Labs  Lab 11/09/18 0634  AST 19  ALT 16  ALKPHOS 66  BILITOT 0.7  PROT 6.9  ALBUMIN 3.8   No results for input(s): LIPASE, AMYLASE in the last 168 hours. No results for input(s): AMMONIA in the last 168 hours. CBC: Recent Labs  Lab 11/09/18 0634 11/10/18 0651 11/11/18 0457  WBC 5.1 4.4 4.2  NEUTROABS 3.3  --   --   HGB 12.7* 12.7* 13.6  HCT 40.1 40.1 43.8  MCV 88.1 89.1 89.0  PLT 199 227 229   Cardiac Enzymes: Recent Labs  Lab 11/09/18 0634  TROPONINI 0.03*   BNP: Invalid input(s): POCBNP CBG: Recent Labs  Lab 11/09/18 1105 11/09/18 1601  GLUCAP 77 91   D-Dimer No results for input(s): DDIMER in the last 72 hours. Hgb A1c No results for input(s): HGBA1C in the last 72 hours. Lipid Profile No results for input(s): CHOL, HDL, LDLCALC, TRIG, CHOLHDL, LDLDIRECT in  the last 72 hours. Thyroid function studies No results for input(s): TSH, T4TOTAL, T3FREE, THYROIDAB in the last 72 hours.  Invalid input(s): FREET3 Anemia work up No results for input(s): VITAMINB12, FOLATE, FERRITIN, TIBC, IRON, RETICCTPCT in the last 72 hours. Urinalysis    Component Value Date/Time   COLORURINE YELLOW 04/01/2018 0332   APPEARANCEUR CLEAR 04/01/2018 0332   LABSPEC 1.008 04/01/2018 0332   PHURINE 7.0 04/01/2018 0332   GLUCOSEU NEGATIVE 04/01/2018 0332   GLUCOSEU NEG mg/dL 69/62/952808/10/2009 [id number]   HGBUR NEGATIVE 04/01/2018 0332   BILIRUBINUR NEGATIVE 04/01/2018 0332   KETONESUR NEGATIVE 04/01/2018 0332   PROTEINUR NEGATIVE 04/01/2018 0332   UROBILINOGEN 0.2 08/08/2014 1445   NITRITE NEGATIVE 04/01/2018 0332   LEUKOCYTESUR NEGATIVE 04/01/2018 0332   Sepsis Labs Invalid input(s): PROCALCITONIN,  WBC,  LACTICIDVEN Microbiology Recent Results (from the past 240 hour(s))  SARS Coronavirus 2 (CEPHEID - Performed in [facility] hospital lab), Hosp Order     Status: None   Collection Time: 11/09/18  7:33 AM  Result Value Ref Range Status   SARS Coronavirus 2 NEGATIVE NEGATIVE Final    Comment: (NOTE) If result is NEGATIVE SARS-CoV-2  target nucleic acids are NOT DETECTED. The SARS-CoV-2 RNA is generally detectable in upper and lower  respiratory specimens during the acute phase of infection. The lowest  concentration of SARS-CoV-2 viral copies this assay can detect is 250  copies / mL. A negative result does not preclude SARS-CoV-2 infection  and should not be used as the sole basis for treatment or other  patient management decisions.  A negative result may occur with  improper specimen collection / handling, submission of specimen other  than nasopharyngeal swab, presence of viral mutation(s) within the  areas targeted by this assay, and inadequate number of viral copies  (<250 copies / mL). A negative result must be combined with clinical  observations, patient  history, and epidemiological information. If result is POSITIVE SARS-CoV-2 target nucleic acids are DETECTED. The SARS-CoV-2 RNA is generally detectable in upper and lower  respiratory specimens dur ing the acute phase of infection.  Positive  results are indicative of active infection with SARS-CoV-2.  Clinical  correlation with patient history and other diagnostic information is  necessary to determine patient infection status.  Positive results do  not rule out bacterial infection or co-infection with other viruses. If result is PRESUMPTIVE POSTIVE SARS-CoV-2 nucleic acids MAY BE PRESENT.   A presumptive positive result was obtained on the submitted specimen  and confirmed on repeat testing.  While 2019 novel coronavirus  (SARS-CoV-2) nucleic acids may be present in the submitted sample  additional confirmatory testing may be necessary for epidemiological  and / or clinical management purposes  to differentiate between  SARS-CoV-2 and other Sarbecovirus currently known to infect humans.  If clinically indicated additional testing with an alternate test  methodology [phone]) is advised. The SARS-CoV-2 RNA is generally  detectable in upper and lower respiratory sp ecimens during the acute  phase of infection. The expected result is Negative. Fact Sheet for Patients:  BoilerBrush.com.cy Fact Sheet for Healthcare Providers: https://pope.com/ This test is not yet approved or cleared by the [place] FDA and has been authorized for detection and/or diagnosis of SARS-CoV-2 by FDA under an Emergency Use Authorization (EUA).  This EUA will remain in effect (meaning this test can be used) for the duration of the COVID-19 declaration under Section 564(b)(1) of the Act, 21 U.S.C. section 360bbb-3(b)(1), unless the authorization is terminated or revoked sooner. Performed at [facility], [address], [place], [place] [address]   Culture,  blood (routine x 2)     Status: None (Preliminary result)   Collection Time: 11/09/18  7:51 AM  Result Value Ref Range Status   Specimen Description BLOOD LEFT ARM  Final   Special Requests   Final    BOTTLES DRAWN AEROBIC AND ANAEROBIC Blood Culture adequate volume   Culture   Final    NO GROWTH 2 DAYS Performed at [facility], [address] [place], [place] [address]    Report Status PENDING  Incomplete  Culture, blood (routine x 2)     Status: None (Preliminary result)   Collection Time: 11/09/18  7:59 AM  Result Value Ref Range Status   Specimen Description BLOOD RIGHT HAND  Final   Special Requests   Final    BOTTLES DRAWN AEROBIC AND ANAEROBIC Blood Culture adequate volume   Culture   Final    NO GROWTH 2 DAYS Performed at [facility], [address], [place], [place] [address]    Report Status PENDING  Incomplete  MRSA PCR Screening     Status: None  Collection Time: 11/09/18  8:55 AM  Result Value Ref Range Status   MRSA by PCR NEGATIVE NEGATIVE Final    Comment:        The GeneXpert MRSA Assay (FDA approved for NASAL specimens only), is one component of a comprehensive MRSA colonization surveillance program. It is not intended to diagnose MRSA infection nor to guide or monitor treatment for MRSA infections. Performed at [facility], [address] [place], [place] [address]      Time coordinating discharge: 35 minutes  SIGNED:   Erick Blinks, DO [facility] 11/12/2018, 9:50 AM  If 7PM-7AM, please contact night-coverage [email or web address] Password [email or web address]

## 2018-11-14 LAB — CULTURE, BLOOD (ROUTINE X 2)
Culture: NO GROWTH
Culture: NO GROWTH
Special Requests: ADEQUATE
Special Requests: ADEQUATE

## 2018-11-15 ENCOUNTER — Other Ambulatory Visit: Payer: Self-pay

## 2018-11-15 NOTE — Patient Outreach (Signed)
[facility]) Care Management  11/15/2018  RENEL Fitzpatrick 12-27-1965 [id number]   Attempted outreach with [name]. Spoke with his mother, [name]. [name] was asleep at the time. His mother agreed to have him return the call.   PLAN Pending return call.   Katha Cabal [facility] Community Care Manager [phone]

## 2018-11-15 NOTE — Patient Outreach (Signed)
  Message received from [facility] Admnistrative Assistant. Per the email, [name] was attempting to return my call.   PLAN Will follow up with [name] tomorrow.   [name] [facility] Community Care Manager [phone]

## 2018-11-16 ENCOUNTER — Ambulatory Visit: Payer: Self-pay | Admitting: Pharmacist

## 2018-11-16 ENCOUNTER — Other Ambulatory Visit: Payer: Self-pay

## 2018-11-16 ENCOUNTER — Other Ambulatory Visit: Payer: Self-pay | Admitting: Pharmacist

## 2018-11-16 NOTE — Patient Outreach (Signed)
[facility]) Care Management  11/16/2018  [name] 1966-05-16 [id number]    Follow-up outreach with [name]. He was recently hospitalized for complications related to CHF. He was discharged on 11/12/18. Discharge weight=163kgs.  He denies worsening symptoms since returning home. Reports that he has not been short of breath and has only used supplemental oxygen a few times since discharge. Denies increased edema to his lower extremities. Reports ambulating well and states his activity tolerance is "pretty good."  Current weight-375 lbs.  Discussed medications. [name] reports attempting to take medications as prescribed. He reports taking Bumex around 8am, noon, and again at night. He was informed of attempted outreach from [facility] Pharmacist. He agreed to follow up to complete medication review.  Discussed nutrition. He did not recall his meals since discharge but acknowledges that his sodium intake is too high. Reports he will attempt to decrease his intake of hot dogs and canned meats. He will also attempt to limit his fluid intake. He is agreeable to meal delivery via the [facility] program. He is aware that the program is temporary. PLAN Will notify [facility] SW regarding meal delivery program. Will update [facility] Pharmacist. Will follow up with [name] next week.   Katha Cabal [facility] Community Care Manager [phone]

## 2018-11-16 NOTE — Patient Outreach (Signed)
[facility]) Care Management  11/16/2018  [name] 1965/12/14 [id number]  Patient was called regarding medication review post discharge. Unfortunately, patient did not answer the phone. The phone rang more than 30 times so a HIPAA compliant message could not be left.  Plan: Call patient back within 3 business days.   [name], PharmD, BCACP [facility] Clinical Pharmacist [phone]

## 2018-11-17 ENCOUNTER — Other Ambulatory Visit: Payer: Self-pay

## 2018-11-17 ENCOUNTER — Ambulatory Visit: Payer: Medicare Other | Admitting: Student

## 2018-11-17 DIAGNOSIS — Z79899 Other long term (current) drug therapy: Secondary | ICD-10-CM | POA: Diagnosis not present

## 2018-11-17 DIAGNOSIS — Z9119 Patient's noncompliance with other medical treatment and regimen: Secondary | ICD-10-CM | POA: Diagnosis not present

## 2018-11-17 DIAGNOSIS — I5043 Acute on chronic combined systolic (congestive) and diastolic (congestive) heart failure: Secondary | ICD-10-CM | POA: Diagnosis not present

## 2018-11-17 DIAGNOSIS — J449 Chronic obstructive pulmonary disease, unspecified: Secondary | ICD-10-CM | POA: Diagnosis not present

## 2018-11-17 DIAGNOSIS — G4733 Obstructive sleep apnea (adult) (pediatric): Secondary | ICD-10-CM | POA: Diagnosis not present

## 2018-11-17 NOTE — Patient Outreach (Signed)
[facility]) Care Management  11/17/2018  [name] August 17, 1965 [id number]   Request received from [facility], [name], to refer patient to [facility] which will allow him to receive seven delivered meals per week until end of program.  Referral submitted.  BSW attempted to contact patient to inform him that his first delivery will be on Monday, 11/21/18, however he did not answer and there was no option to leave voicemail message. Will attempt to reach him again tomorrow.  Malachy Chamber, BSW Social Worker [phone]

## 2018-11-18 ENCOUNTER — Ambulatory Visit: Payer: Self-pay

## 2018-11-18 ENCOUNTER — Ambulatory Visit: Payer: Self-pay | Admitting: Pharmacist

## 2018-11-18 ENCOUNTER — Other Ambulatory Visit: Payer: Self-pay

## 2018-11-18 ENCOUNTER — Other Ambulatory Visit: Payer: Self-pay | Admitting: Pharmacist

## 2018-11-18 NOTE — Patient Outreach (Signed)
[facility]) Care Management  11/18/2018  [name] 11/22/1965 [id number]   BSW attempted to contact patient multiple times today to inform him that meal delivery through [facility] is scheduled for Monday, 11/21/18.  No answer or option to leave voicemail message.  Malachy Chamber, BSW Social Worker [phone]

## 2018-11-18 NOTE — Patient Outreach (Signed)
[facility]) Care Management  11/18/2018  [name] May 21, 1966 [id number]   Patient was called regarding medication review post discharge. Unfortunately, he did not answer the phone. HIPAA compliant message could not be left because the phone rang several times without an answer or voicemail.  Consulted with patient's [facility] Community Nurse, Gae Dry.  Patient's weight was back up to 375 pounds a few days ago. Patient's diuretics were changed. He is currently supposed to be taking Bumetanide 2mg  three times daily.  Plan: Attempt to reach the patient next week.   [name], PharmD, BCACP [facility] Clinical Pharmacist [phone]

## 2018-11-21 ENCOUNTER — Other Ambulatory Visit: Payer: Self-pay | Admitting: Pharmacist

## 2018-11-21 ENCOUNTER — Telehealth: Payer: Self-pay | Admitting: Cardiovascular Disease

## 2018-11-21 ENCOUNTER — Other Ambulatory Visit: Payer: Self-pay

## 2018-11-21 NOTE — Telephone Encounter (Signed)
Patient has refused to take Bumex upon d/c. Currently taking Lasix 80mg  BID and 40mg  mid day per patient. Per [facility] just calling to advise that patient is non compliant once again. / tg

## 2018-11-21 NOTE — Patient Outreach (Signed)
[facility]) Care Management  11/21/2018  [name] 10-24-1965 [id number]  Brief outreach with [name]. He was busy at the time of my call. He briefly reports that he is doing "ok" and denies complaints of shortness of breath. He was unable to recall his exact weight but states it was "about 375 pounds." He is agreeable to a follow-up call later this week.  PLAN -Pending meal delivery via [facility] program. -Will follow up on 11/25/18.   Katha Cabal [facility] Community Care Manager [phone]

## 2018-11-21 NOTE — Telephone Encounter (Signed)
I will FYI Dr.Koneswaran 

## 2018-11-21 NOTE — Telephone Encounter (Signed)
Not at all surprised.

## 2018-11-21 NOTE — Patient Outreach (Signed)
[facility]) Care Management  [facility]   11/21/2018  TORRI HIRT 11/10/1965 [id number]  Reason for referral: Medication Reconciliation Post Discharge  Referral source: [facility] RN Current insurance: [facility]  PMHx includes but not limited to: chronic combined systolic and diastolic CHF, a fib,  Pulmonary hypertension, OSA, morbid obesity, obesity hyperventilation syndrome, GERD and hypertension. Patient has had recurrent readmissions due to dietary medication noncompliance and volume overload. Patient was discharged most recently 11/12/2018.  Outreach:  Successful telephone call with patient.  HIPAA identifiers verified.   Subjective:  Patient reports feeling better and taking all medications as prescribed.  Objective: The ASCVD Risk score Denman George DC Jr., et al., 2013) failed to calculate for the following reasons:   Cannot find a previous HDL lab   Cannot find a previous total cholesterol lab  Lab Results  Component Value Date   CREATININE 1.30 (H) 11/12/2018   CREATININE 1.17 11/11/2018   CREATININE 1.19 11/10/2018    Lab Results  Component Value Date   HGBA1C 5.7 (H) 04/04/2016    Lipid Panel     Component Value Date/Time   CHOL 139 06/14/2015 0328   TRIG 58 06/14/2015 0328   HDL 28 (L) 06/14/2015 0328   CHOLHDL 5.0 06/14/2015 0328   VLDL 12 06/14/2015 0328   LDLCALC 99 06/14/2015 0328    BP Readings from Last 3 Encounters:  11/12/18 (!) 125/93  11/04/18 (!) 149/95  11/02/18 (!) 148/91    Allergies  Allergen Reactions  . Banana Shortness Of Breath  . Bee Venom Shortness Of Breath, Swelling and Other (See Comments)    Reaction:  Facial swelling  . Strawberry Flavor Shortness Of Breath and Rash  . Aspirin Other (See Comments)    Reaction:  GI upset   . Metolazone Other (See Comments)    Pt states that he stopped taking this med due to heart attack like symptoms.   . Oatmeal Nausea And Vomiting  . Orange Juice [Orange  Oil] Nausea And Vomiting    All acidic products make him nauseous and upset stomach  . Torsemide Swelling and Other (See Comments)    Reaction:  Swelling of feet/legs   . Diltiazem Palpitations  . Hydralazine Palpitations  . Lipitor [Atorvastatin] Other (See Comments)    Reaction:  Nose bleeds     Medications Reviewed Today    Reviewed by Peter Minium, CPhT (Pharmacy Technician) on 11/09/18 at 1212  Med List Status: Complete  Medication Order Taking? Sig Documenting Provider Last Dose Status Informant  acetaminophen (TYLENOL) 325 MG tablet [id number] Yes Take 2 tablets (650 mg total) by mouth every 4 (four) hours as needed for headache or mild pain. Calvert Cantor, MD  Active Self  albuterol (PROVENTIL) (2.5 MG/3ML) 0.083% nebulizer solution [id number] Yes Take 2.5 mg by nebulization every 6 (six) hours as needed for wheezing or shortness of breath. [provider]  Active Self           Med Note Allyne Gee, Irven Easterly Mar 04, 2017  8:53 PM)    atorvastatin (LIPITOR) 40 MG tablet [id number] Yes Take 40 mg by mouth daily.  [provider] 11/08/2018 Unknown time Active Self  bumetanide (BUMEX) 2 MG tablet [id number] Yes Take 2 mg by mouth 2 (two) times daily. [provider] 11/08/2018 Unknown time Active Self  carvedilol (COREG) 25 MG tablet [id number] Yes Take 1 tablet (25 mg total) by mouth 2 (two) times daily with a  meal for 30 days.  Patient taking differently:  Take 25 mg by mouth 2 (two) times a day.    Sherryll Burger, Pratik D, DO 11/08/2018 Unknown time Active Self  docusate sodium (COLACE) 100 MG capsule [id number] Yes Take 1 capsule (100 mg total) by mouth 2 (two) times daily.  Patient taking differently:  Take 100 mg by mouth daily as needed for mild constipation or moderate constipation.    Kathlen Mody, MD  Active Self  ferrous gluconate (FERGON) 324 MG tablet [id number] Yes Take 1 tablet (324 mg total) by mouth 2 (two) times daily with a meal.  Patient taking  differently:  Take 324 mg by mouth daily.    Kathlen Mody, MD 11/08/2018 Unknown time Active Self  fluticasone (FLOVENT HFA) 220 MCG/ACT inhaler [id number] Yes Inhale 2 puffs into the lungs daily as needed (for shortness of breath).  [provider] 11/08/2018 Unknown time Active Self  furosemide (LASIX) 40 MG tablet [id number] Yes Take 40 mg by mouth 2 (two) times daily. [provider] 11/08/2018 Unknown time Active Self           Med Note Fredric Mare, Hershal Coria Oct 30, 2018 10:21 AM) Patient takes the  twice a day, in addition to the  twice a day  furosemide (LASIX) 80 MG tablet [id number] Yes Take 1.5 tablets (120 mg total) by mouth 2 (two) times daily. Vassie Loll, MD 11/08/2018 Unknown time Active Self           Med Note Fredric Mare, Hershal Coria Oct 30, 2018 10:20 AM) Patient takes  twice a day, in addition to the  twice a day  lisinopril (ZESTRIL) 5 MG tablet [id number] Yes Take 2 tablets (10 mg total) by mouth daily. Calvert Cantor, MD 11/08/2018 Unknown time Active Self  OXYGEN [id number] Yes Inhale 3.5 L into the lungs continuous.  [provider]  Active Self           Med Note Reva Bores, Helaine Chess   Fri Jul 22, 2018  5:23 PM)    pantoprazole (PROTONIX) 40 MG tablet [id number] Yes Take 1 tablet (40 mg total) by mouth daily at 6 (six) AM.  Patient taking differently:  Take 40 mg by mouth daily.    Kathlen Mody, MD 11/08/2018 Unknown time Active Self  potassium chloride SA (K-DUR) 20 MEQ tablet [id number] Yes Take 2 tablets (40 mEq total) by mouth 2 (two) times daily. Vassie Loll, MD 11/08/2018 Unknown time Active Self  rivaroxaban (XARELTO) 20 MG TABS tablet [id number] Yes Take 1 tablet (20 mg total) by mouth daily with breakfast.  Patient taking differently:  Take 20 mg by mouth daily with supper.    Kathlen Mody, MD 11/08/2018 8 pm Active Self           Med Note Fredric Mare, Hershal Coria Sep 18, 2018  9:10 AM)    spironolactone (ALDACTONE)  100 MG tablet [id number] Yes Take 100 mg by mouth daily. For fluid [provider] 11/08/2018 Unknown time Active Self  tamsulosin (FLOMAX) 0.4 MG CAPS capsule [id number] Yes Take 1 capsule (0.4 mg total) by mouth daily. Kathlen Mody, MD 11/08/2018 Unknown time Active Self          Assessment: Patient's medications were reviewed via telephone.    Drugs sorted by system:   Cardiovascular: Atorvastatin, Bumetanide, Carvedilol,  Xarelto, Spironolactone,   Pulmonary/Allergy: Albuterol Nebulizer Solution, Fluticasone HFA, Oxygen,   Gastrointestinal: Docusate Sodium, Pantoprazole,   Pain: Acetaminophen  Genitourinary: Tamsulosin  Vitamins/Minerals/Supplements: Ferrous Gluconate, Potassium Chloride,   Miscellaneous:  Medication Review Findings:   Non-compliance: Patient reported to me that he was taking his bumetanide three times daily as prescribed. When I completed the documentation of our telephone encounter later in the day, I found the following  documented by Raechel Ache in a telephone note in the patient's chart (11/21/2018):     "Patient has refused to take Bumex upon d/c. Currently taking Lasix 80mg  BID and 40mg  mid day per patient. Per [facility] just calling to advise that patient is non compliant once again."   Patient reported his weight today as 374 pounds. He was discharged from the hospital on 11/12/2018 at 358 pounds. Patient said he  was in the middle of eating lunch at the time of my call and reported eating a "boiled hamburger".    He commented that he was expecting a food delivery.  [facility] Social Worker, Hospital doctor Chrismon documented on 11/18/2018 that the patient was scheduled to receive meal delivery today through the [facility] COVID-19 [employer].    Medication Assistance Findings:  No medication assistance needs identified  Plan: . Will route note to [facility] Community Nurse, Carloyn Manner, RN and patient's PCP. Marland Kitchen Will follow up with the patient  in 5-7 business days   [name], PharmD, [facility] Clinical Pharmacist [phone]

## 2018-11-24 ENCOUNTER — Emergency Department (HOSPITAL_COMMUNITY): Payer: Medicare Other

## 2018-11-24 ENCOUNTER — Inpatient Hospital Stay (HOSPITAL_COMMUNITY)
Admission: EM | Admit: 2018-11-24 | Discharge: 2018-11-26 | DRG: 291 | Disposition: A | Discharge status: Home Health | Payer: Medicare Other | Attending: Internal Medicine | Admitting: Internal Medicine

## 2018-11-24 ENCOUNTER — Encounter (HOSPITAL_COMMUNITY): Payer: Self-pay | Admitting: Emergency Medicine

## 2018-11-24 ENCOUNTER — Other Ambulatory Visit: Payer: Self-pay

## 2018-11-24 DIAGNOSIS — Z79899 Other long term (current) drug therapy: Secondary | ICD-10-CM | POA: Diagnosis not present

## 2018-11-24 DIAGNOSIS — K219 Gastro-esophageal reflux disease without esophagitis: Secondary | ICD-10-CM | POA: Diagnosis not present

## 2018-11-24 DIAGNOSIS — Z6841 Body Mass Index (BMI) 40.0 and over, adult: Secondary | ICD-10-CM

## 2018-11-24 DIAGNOSIS — Z7901 Long term (current) use of anticoagulants: Secondary | ICD-10-CM

## 2018-11-24 DIAGNOSIS — I272 Pulmonary hypertension, unspecified: Secondary | ICD-10-CM | POA: Diagnosis present

## 2018-11-24 DIAGNOSIS — Z20828 Contact with and (suspected) exposure to other viral communicable diseases: Secondary | ICD-10-CM | POA: Diagnosis present

## 2018-11-24 DIAGNOSIS — E876 Hypokalemia: Secondary | ICD-10-CM | POA: Diagnosis present

## 2018-11-24 DIAGNOSIS — R0602 Shortness of breath: Secondary | ICD-10-CM | POA: Diagnosis not present

## 2018-11-24 DIAGNOSIS — I890 Lymphedema, not elsewhere classified: Secondary | ICD-10-CM | POA: Diagnosis not present

## 2018-11-24 DIAGNOSIS — E662 Morbid (severe) obesity with alveolar hypoventilation: Secondary | ICD-10-CM | POA: Diagnosis present

## 2018-11-24 DIAGNOSIS — R2241 Localized swelling, mass and lump, right lower limb: Secondary | ICD-10-CM | POA: Diagnosis present

## 2018-11-24 DIAGNOSIS — I130 Hypertensive heart and chronic kidney disease with heart failure and stage 1 through stage 4 chronic kidney disease, or unspecified chronic kidney disease: Secondary | ICD-10-CM | POA: Diagnosis not present

## 2018-11-24 DIAGNOSIS — Z87891 Personal history of nicotine dependence: Secondary | ICD-10-CM

## 2018-11-24 DIAGNOSIS — I482 Chronic atrial fibrillation, unspecified: Secondary | ICD-10-CM | POA: Diagnosis not present

## 2018-11-24 DIAGNOSIS — Z9114 Patient's other noncompliance with medication regimen: Secondary | ICD-10-CM

## 2018-11-24 DIAGNOSIS — R601 Generalized edema: Secondary | ICD-10-CM | POA: Diagnosis not present

## 2018-11-24 DIAGNOSIS — Z9119 Patient's noncompliance with other medical treatment and regimen: Secondary | ICD-10-CM | POA: Diagnosis not present

## 2018-11-24 DIAGNOSIS — I5043 Acute on chronic combined systolic (congestive) and diastolic (congestive) heart failure: Secondary | ICD-10-CM | POA: Diagnosis not present

## 2018-11-24 DIAGNOSIS — Z8249 Family history of ischemic heart disease and other diseases of the circulatory system: Secondary | ICD-10-CM

## 2018-11-24 DIAGNOSIS — Z9111 Patient's noncompliance with dietary regimen: Secondary | ICD-10-CM

## 2018-11-24 DIAGNOSIS — N182 Chronic kidney disease, stage 2 (mild): Secondary | ICD-10-CM | POA: Diagnosis present

## 2018-11-24 DIAGNOSIS — Z9981 Dependence on supplemental oxygen: Secondary | ICD-10-CM | POA: Diagnosis not present

## 2018-11-24 DIAGNOSIS — G4733 Obstructive sleep apnea (adult) (pediatric): Secondary | ICD-10-CM | POA: Diagnosis present

## 2018-11-24 DIAGNOSIS — Z823 Family history of stroke: Secondary | ICD-10-CM

## 2018-11-24 DIAGNOSIS — R5381 Other malaise: Secondary | ICD-10-CM | POA: Diagnosis not present

## 2018-11-24 DIAGNOSIS — I4891 Unspecified atrial fibrillation: Secondary | ICD-10-CM | POA: Diagnosis not present

## 2018-11-24 DIAGNOSIS — R069 Unspecified abnormalities of breathing: Secondary | ICD-10-CM | POA: Diagnosis not present

## 2018-11-24 DIAGNOSIS — I499 Cardiac arrhythmia, unspecified: Secondary | ICD-10-CM | POA: Diagnosis not present

## 2018-11-24 DIAGNOSIS — I5042 Chronic combined systolic (congestive) and diastolic (congestive) heart failure: Secondary | ICD-10-CM | POA: Diagnosis not present

## 2018-11-24 DIAGNOSIS — R609 Edema, unspecified: Secondary | ICD-10-CM | POA: Diagnosis not present

## 2018-11-24 DIAGNOSIS — I491 Atrial premature depolarization: Secondary | ICD-10-CM | POA: Diagnosis not present

## 2018-11-24 DIAGNOSIS — J449 Chronic obstructive pulmonary disease, unspecified: Secondary | ICD-10-CM | POA: Diagnosis not present

## 2018-11-24 LAB — BASIC METABOLIC PANEL
Anion gap: 10 (ref 5–15)
BUN: 14 mg/dL (ref 6–20)
CO2: 24 mmol/L (ref 22–32)
Calcium: 8.8 mg/dL — ABNORMAL LOW (ref 8.9–10.3)
Chloride: 104 mmol/L (ref 98–111)
Creatinine, Ser: 1.05 mg/dL (ref 0.61–1.24)
GFR calc Af Amer: >60 mL/min (ref >60)
GFR calc non Af Amer: >60 mL/min (ref >60)
Glucose, Bld: 108 mg/dL — ABNORMAL HIGH (ref 70–99)
Potassium: 3.4 mmol/L — ABNORMAL LOW (ref 3.5–5.1)
Sodium: 138 mmol/L (ref 135–145)

## 2018-11-24 LAB — CBC WITH DIFFERENTIAL/PLATELET
Abs Immature Granulocytes: 0.01 10*3/uL (ref 0.00–0.07)
Basophils Absolute: 0 10*3/uL (ref 0.0–0.1)
Basophils Relative: 1 %
Eosinophils Absolute: 0.2 10*3/uL (ref 0.0–0.5)
Eosinophils Relative: 4 %
HCT: 40.7 % (ref 39.0–52.0)
Hemoglobin: 13.1 g/dL (ref 13.0–17.0)
Immature Granulocytes: 0 %
Lymphocytes Relative: 19 %
Lymphs Abs: 0.8 10*3/uL (ref 0.7–4.0)
MCH: 28.3 pg (ref 26.0–34.0)
MCHC: 32.2 g/dL (ref 30.0–36.0)
MCV: 87.9 fL (ref 80.0–100.0)
Monocytes Absolute: 0.3 10*3/uL (ref 0.1–1.0)
Monocytes Relative: 8 %
Neutro Abs: 2.9 10*3/uL (ref 1.7–7.7)
Neutrophils Relative %: 68 %
Platelets: 181 10*3/uL (ref 150–400)
RBC: 4.63 MIL/uL (ref 4.22–5.81)
RDW: 14.6 % (ref 11.5–15.5)
WBC: 4.3 10*3/uL (ref 4.0–10.5)
nRBC: 0 % (ref 0.0–0.2)

## 2018-11-24 LAB — TROPONIN I: Troponin I: <0.03 ng/mL (ref <0.03)

## 2018-11-24 LAB — SARS CORONAVIRUS 2 BY RT PCR (HOSPITAL ORDER, PERFORMED IN ~~LOC~~ HOSPITAL LAB): SARS Coronavirus 2: NEGATIVE

## 2018-11-24 LAB — BRAIN NATRIURETIC PEPTIDE: B Natriuretic Peptide: 281 pg/mL — ABNORMAL HIGH (ref 0.0–100.0)

## 2018-11-24 MED ORDER — FUROSEMIDE 10 MG/ML IJ SOLN
80.0000 mg | Freq: Once | INTRAMUSCULAR | Status: AC
  Administered 2018-11-24: 80 mg via INTRAVENOUS
  Filled 2018-11-24: qty 8

## 2018-11-24 MED ORDER — SODIUM CHLORIDE 0.9% FLUSH
3.0000 mL | INTRAVENOUS | Status: DC | PRN
  Administered 2018-11-24: 3 mL via INTRAVENOUS
  Filled 2018-11-24: qty 3

## 2018-11-24 MED ORDER — SPIRONOLACTONE 100 MG PO TABS
100.0000 mg | ORAL_TABLET | Freq: Every day | ORAL | Status: DC
  Administered 2018-11-24 – 2018-11-26 (×3): 100 mg via ORAL
  Filled 2018-11-24: qty 4
  Filled 2018-11-24 (×2): qty 1

## 2018-11-24 MED ORDER — POTASSIUM CHLORIDE CRYS ER 20 MEQ PO TBCR
40.0000 meq | EXTENDED_RELEASE_TABLET | Freq: Two times a day (BID) | ORAL | Status: DC
  Administered 2018-11-24 – 2018-11-26 (×5): 40 meq via ORAL
  Filled 2018-11-24 (×5): qty 2

## 2018-11-24 MED ORDER — FLUTICASONE PROPIONATE HFA 220 MCG/ACT IN AERO: 2.0000 | INHALATION_SPRAY | Freq: Every day | RESPIRATORY_TRACT | Status: DC | PRN

## 2018-11-24 MED ORDER — DOCUSATE SODIUM 100 MG PO CAPS: 100.0000 mg | ORAL_CAPSULE | Freq: Every day | ORAL | Status: DC | PRN

## 2018-11-24 MED ORDER — ACETAMINOPHEN 325 MG PO TABS
650.0000 mg | ORAL_TABLET | Freq: Once | ORAL | Status: AC
  Administered 2018-11-24: 650 mg via ORAL
  Filled 2018-11-24: qty 2

## 2018-11-24 MED ORDER — SODIUM CHLORIDE 0.9 % IV SOLN: 250.0000 mL | INTRAVENOUS | Status: DC | PRN

## 2018-11-24 MED ORDER — FUROSEMIDE 10 MG/ML IJ SOLN
80.0000 mg | Freq: Two times a day (BID) | INTRAMUSCULAR | Status: DC
  Administered 2018-11-24 – 2018-11-26 (×4): 80 mg via INTRAVENOUS
  Filled 2018-11-24 (×4): qty 8

## 2018-11-24 MED ORDER — ALBUTEROL SULFATE (2.5 MG/3ML) 0.083% IN NEBU: 2.5000 mg | INHALATION_SOLUTION | Freq: Four times a day (QID) | RESPIRATORY_TRACT | Status: DC | PRN

## 2018-11-24 MED ORDER — ONDANSETRON HCL 4 MG/2ML IJ SOLN: 4.0000 mg | Freq: Four times a day (QID) | INTRAMUSCULAR | Status: DC | PRN

## 2018-11-24 MED ORDER — PANTOPRAZOLE SODIUM 40 MG PO TBEC
40.0000 mg | DELAYED_RELEASE_TABLET | Freq: Every day | ORAL | Status: DC
  Administered 2018-11-24 – 2018-11-26 (×3): 40 mg via ORAL
  Filled 2018-11-24 (×3): qty 1

## 2018-11-24 MED ORDER — FERROUS GLUCONATE 324 (38 FE) MG PO TABS
324.0000 mg | ORAL_TABLET | Freq: Two times a day (BID) | ORAL | Status: DC
  Administered 2018-11-25 – 2018-11-26 (×3): 324 mg via ORAL
  Filled 2018-11-24 (×9): qty 1

## 2018-11-24 MED ORDER — ONDANSETRON HCL 4 MG PO TABS: 4.0000 mg | ORAL_TABLET | Freq: Four times a day (QID) | ORAL | Status: DC | PRN

## 2018-11-24 MED ORDER — RIVAROXABAN 20 MG PO TABS
20.0000 mg | ORAL_TABLET | Freq: Every day | ORAL | Status: DC
  Administered 2018-11-24 – 2018-11-25 (×2): 20 mg via ORAL
  Filled 2018-11-24 (×2): qty 1

## 2018-11-24 MED ORDER — SODIUM CHLORIDE 0.9% FLUSH
3.0000 mL | Freq: Two times a day (BID) | INTRAVENOUS | Status: DC
  Administered 2018-11-24 – 2018-11-25 (×3): 3 mL via INTRAVENOUS

## 2018-11-24 MED ORDER — ACETAMINOPHEN 325 MG PO TABS: 650.0000 mg | ORAL_TABLET | Freq: Four times a day (QID) | ORAL | Status: DC | PRN

## 2018-11-24 MED ORDER — ACETAMINOPHEN 650 MG RE SUPP: 650.0000 mg | Freq: Four times a day (QID) | RECTAL | Status: DC | PRN

## 2018-11-24 MED ORDER — TAMSULOSIN HCL 0.4 MG PO CAPS
0.4000 mg | ORAL_CAPSULE | Freq: Every day | ORAL | Status: DC
  Administered 2018-11-24 – 2018-11-26 (×3): 0.4 mg via ORAL
  Filled 2018-11-24 (×3): qty 1

## 2018-11-24 MED ORDER — CARVEDILOL 12.5 MG PO TABS
25.0000 mg | ORAL_TABLET | Freq: Two times a day (BID) | ORAL | Status: DC
  Administered 2018-11-24 – 2018-11-26 (×4): 25 mg via ORAL
  Filled 2018-11-24 (×4): qty 2

## 2018-11-24 MED ORDER — ATORVASTATIN CALCIUM 40 MG PO TABS
40.0000 mg | ORAL_TABLET | Freq: Every day | ORAL | Status: DC
  Administered 2018-11-24 – 2018-11-26 (×3): 40 mg via ORAL
  Filled 2018-11-24 (×3): qty 1

## 2018-11-24 MED ORDER — BUDESONIDE 0.5 MG/2ML IN SUSP
0.5000 mg | Freq: Two times a day (BID) | RESPIRATORY_TRACT | Status: DC
  Administered 2018-11-24 – 2018-11-25 (×3): 0.5 mg via RESPIRATORY_TRACT
  Filled 2018-11-24 (×4): qty 2

## 2018-11-24 NOTE — H&P (Signed)
History and Physical    [name] ANV:[id number] DOB: May 28, 1966 DOA: 11/24/2018  PCP: Pearson Grippe, MD   Patient coming from: Home  Chief Complaint: Edema and shortness of breath  HPI: [name] is a 53 y.o. male with medical history significant for chronic combined systolic and diastolic CHF with recurrent readmissions due to dietary medication noncompliance, atrial fibrillation on Xarelto, hypertension, MI, asthma, OSA on CPAP, and morbid obesity who presents back to the emergency department after recent discharge on 5/23 for acute heart failure exacerbation at that time.  His discharge weight recently was 163 EKG and he is currently at 176 kg.  Per outpatient notes, patient was discharged on Bumex 3 times daily as recommended by his cardiologist, but he went back to taking Lasix 80 mg twice a day with an additional dose of 40 mg midday.  He states that his medications are ineffective and he was also noted to be having a hamburger at the time of one of the outreach calls.  Patient is chronically noncompliant with medications and diet.  He has had worsening lower extremity edema and shortness of breath today which prompted the ED visit.  Patient denies any fevers, chills, or cough.   ED Course: Vital signs are stable with some elevated blood pressure readings noted.  He has been given 1 dose of IV Lasix 80 mg in ED.  Laboratory data with potassium 3.4 no other significant abnormalities noted.  1 view chest x-ray with mild vascular congestion noted.  BNP at 281 and troponin less than 0.03.  EKG with atrial fibrillation at 101 bpm.  Review of Systems: All others reviewed and otherwise negative.  Past Medical History:  Diagnosis Date  . Allergic rhinitis   . Asthma   . Atrial fibrillation (HCC)   . CHF (congestive heart failure) (HCC)   . Elevated troponin level not due myocardial infarction 06/13/2015   cath w/ nl cors, felt 2nd HTN, CHF, peat trop 2.73  . Essential hypertension   .  Gastroesophageal reflux disease   . History of cardiac catheterization    Normal coronaries December 2016  . Noncompliance    Major problem leading to declining health and recurrent hospitalization  . Nonischemic cardiomyopathy (HCC)    a. LVEF 20-25% in 2017 with cath in 2016 showing normal cors. b. EF 55% by echo in 03/2018.  . On home O2    3L N/C  . OSA (obstructive sleep apnea)   . Osteoarthritis   . Peptic ulcer disease     Past Surgical History:  Procedure Laterality Date  . CARDIAC CATHETERIZATION N/A 06/14/2015   Procedure: Right/Left Heart Cath and Coronary Angiography;  Surgeon: Runell Gess, MD;  Location: [facility] INVASIVE CV LAB;  Service: Cardiovascular;  Laterality: N/A;     reports that he quit smoking about 11 years ago. His smoking use included cigarettes. He started smoking about 30 years ago. He has a 10.00 pack-year smoking history. He has never used smokeless tobacco. He reports that he does not drink alcohol or use drugs.  Allergies  Allergen Reactions  . Banana Shortness Of Breath  . Bee Venom Shortness Of Breath, Swelling and Other (See Comments)    Reaction:  Facial swelling  . Strawberry Flavor Shortness Of Breath and Rash  . Aspirin Other (See Comments)    Reaction:  GI upset   . Metolazone Other (See Comments)    Pt states that he stopped taking this med due to heart attack like  symptoms.   . Oatmeal Nausea And Vomiting  . Orange Juice [Orange Oil] Nausea And Vomiting    All acidic products make him nauseous and upset stomach  . Torsemide Swelling and Other (See Comments)    Reaction:  Swelling of feet/legs   . Diltiazem Palpitations  . Hydralazine Palpitations  . Lipitor [Atorvastatin] Other (See Comments)    Reaction:  Nose bleeds     Family History  Problem Relation Age of Onset  . Stroke Father   . Heart attack Father   . Aneurysm Mother        Cerebral aneurysm  . Hypertension Sister   . Colon cancer Neg Hx   . Inflammatory bowel  disease Neg Hx   . Liver disease Neg Hx     Prior to Admission medications   Medication Sig Start Date End Date Taking? Authorizing Provider  acetaminophen (TYLENOL) 325 MG tablet Take 2 tablets (650 mg total) by mouth every 4 (four) hours as needed for headache or mild pain. 11/02/18   Calvert Cantor, MD  albuterol (PROVENTIL) (2.5 MG/3ML) 0.083% nebulizer solution Take 2.5 mg by nebulization every 6 (six) hours as needed for wheezing or shortness of breath.    [provider]  atorvastatin (LIPITOR) 40 MG tablet Take 40 mg by mouth daily.  09/05/18   [provider]  bumetanide (BUMEX) 2 MG tablet Take 1 tablet (2 mg total) by mouth 3 (three) times daily for 30 days. 11/12/18 12/12/18  Sherryll Burger, Broughton Eppinger D, DO  carvedilol (COREG) 25 MG tablet Take 1 tablet (25 mg total) by mouth 2 (two) times daily with a meal for 30 days. 08/18/18 11/04/19  Sherryll Burger, Gwynneth Fabio D, DO  docusate sodium (COLACE) 100 MG capsule Take 1 capsule (100 mg total) by mouth 2 (two) times daily. Patient taking differently: Take 100 mg by mouth daily as needed for mild constipation or moderate constipation.  06/25/16   Kathlen Mody, MD  ferrous gluconate (FERGON) 324 MG tablet Take 1 tablet (324 mg total) by mouth 2 (two) times daily with a meal. Patient taking differently: Take 324 mg by mouth daily.  06/25/16   Kathlen Mody, MD  fluticasone (FLOVENT HFA) 220 MCG/ACT inhaler Inhale 2 puffs into the lungs daily as needed (for shortness of breath).     [provider]  OXYGEN Inhale 3.5 L into the lungs continuous.     [provider]  pantoprazole (PROTONIX) 40 MG tablet Take 1 tablet (40 mg total) by mouth daily at 6 (six) AM. 06/26/16   Kathlen Mody, MD  potassium chloride SA (K-DUR) 20 MEQ tablet Take 2 tablets (40 mEq total) by mouth 2 (two) times daily. 10/07/18   Vassie Loll, MD  rivaroxaban (XARELTO) 20 MG TABS tablet Take 1 tablet (20 mg total) by mouth daily with breakfast. Patient taking differently:  Take 20 mg by mouth daily with supper.  06/26/16   Kathlen Mody, MD  spironolactone (ALDACTONE) 100 MG tablet Take 100 mg by mouth daily. For fluid 03/18/17   [provider]  tamsulosin (FLOMAX) 0.4 MG CAPS capsule Take 1 capsule (0.4 mg total) by mouth daily. 06/25/16   Kathlen Mody, MD  potassium chloride 20 MEQ TBCR Take 20 mEq by mouth 2 (two) times daily. 09/03/15 11/04/18  Ward, Layla Maw, DO    Physical Exam: Vitals:   11/24/18 0800 11/24/18 0830 11/24/18 0831 11/24/18 0845  BP: (!) 167/63 (!) 144/95  (!) 152/99  Pulse: 94   65  Resp: 18 (!)  27  (!) 22  Temp:    98.1 F (36.7 C)  TempSrc:    Oral  SpO2: 100%   96%  Weight:   (!) 176.2 kg   Height:        Constitutional: NAD, calm, comfortable, obese Vitals:   11/24/18 0800 11/24/18 0830 11/24/18 0831 11/24/18 0845  BP: (!) 167/63 (!) 144/95  (!) 152/99  Pulse: 94   65  Resp: 18 (!) 27  (!) 22  Temp:    98.1 F (36.7 C)  TempSrc:    Oral  SpO2: 100%   96%  Weight:   (!) 176.2 kg   Height:       Eyes: lids and conjunctivae normal ENMT: Mucous membranes are moist.  Neck: normal, supple Respiratory: clear to auscultation bilaterally. Normal respiratory effort. No accessory muscle use.  On nasal cannula oxygen Cardiovascular: Regular rate and rhythm, no murmurs. No extremity edema. Abdomen: no tenderness, no distention. Bowel sounds positive.  Musculoskeletal:  No joint deformity upper and lower extremities.   Skin: no rashes, lesions, ulcers.  Psychiatric: Difficult to assess  Labs on Admission: I have personally reviewed following labs and imaging studies  CBC: Recent Labs  Lab 11/24/18 0634  WBC 4.3  NEUTROABS 2.9  HGB 13.1  HCT 40.7  MCV 87.9  PLT 181   Basic Metabolic Panel: Recent Labs  Lab 11/24/18 0634  NA 138  K 3.4*  CL 104  CO2 24  GLUCOSE 108*  BUN 14  CREATININE 1.05  CALCIUM 8.8*   GFR: Estimated Creatinine Clearance: 139.3 mL/min (by C-G formula based on SCr of 1.05 mg/dL).  Liver Function Tests: No results for input(s): AST, ALT, ALKPHOS, BILITOT, PROT, ALBUMIN in the last 168 hours. No results for input(s): LIPASE, AMYLASE in the last 168 hours. No results for input(s): AMMONIA in the last 168 hours. Coagulation Profile: No results for input(s): INR, PROTIME in the last 168 hours. Cardiac Enzymes: Recent Labs  Lab 11/24/18 0634  TROPONINI <0.03   BNP (last 3 results) No results for input(s): PROBNP in the last 8760 hours. HbA1C: No results for input(s): HGBA1C in the last 72 hours. CBG: No results for input(s): GLUCAP in the last 168 hours. Lipid Profile: No results for input(s): CHOL, HDL, LDLCALC, TRIG, CHOLHDL, LDLDIRECT in the last 72 hours. Thyroid Function Tests: No results for input(s): TSH, T4TOTAL, FREET4, T3FREE, THYROIDAB in the last 72 hours. Anemia Panel: No results for input(s): VITAMINB12, FOLATE, FERRITIN, TIBC, IRON, RETICCTPCT in the last 72 hours. Urine analysis:    Component Value Date/Time   COLORURINE YELLOW 04/01/2018 0332   APPEARANCEUR CLEAR 04/01/2018 0332   LABSPEC 1.008 04/01/2018 0332   PHURINE 7.0 04/01/2018 0332   GLUCOSEU NEGATIVE 04/01/2018 0332   GLUCOSEU NEG mg/dL 40/98/1191 4782   HGBUR NEGATIVE 04/01/2018 0332   BILIRUBINUR NEGATIVE 04/01/2018 0332   KETONESUR NEGATIVE 04/01/2018 0332   PROTEINUR NEGATIVE 04/01/2018 0332   UROBILINOGEN 0.2 08/08/2014 1445   NITRITE NEGATIVE 04/01/2018 0332   LEUKOCYTESUR NEGATIVE 04/01/2018 0332    Radiological Exams on Admission: Dg Chest Portable 1 View  Result Date: 11/24/2018 CLINICAL DATA:  Increasing shortness of breath EXAM: PORTABLE CHEST 1 VIEW COMPARISON:  11/09/2018 FINDINGS: Cardiac shadow remains enlarged but stable. The lungs are well aerated bilaterally. Vascular congestion is again seen. No focal infiltrate is noted. No bony abnormality is seen. IMPRESSION: Mild vascular congestion.  No focal infiltrate is noted Electronically Signed   By: Eulah Pont.D.  On: 11/24/2018 07:46    EKG: Independently reviewed.  Atrial fibrillation 101 bpm.  Assessment/Plan Principal Problem:   Acute on chronic combined systolic and diastolic CHF (congestive heart failure) (HCC) Active Problems:   Atrial fibrillation, chronic   Pulmonary hypertension (HCC)   OSA (obstructive sleep apnea)   CKD (chronic kidney disease), stage II    Acute on chronic combined CHF exacerbation -Secondary to medication and dietary noncompliance -Lasix 80 mg IV twice daily with home spironolactone and monitor daily weights and I's and O's -Goal weight on discharge 163 EKG -Prior 2D echocardiogram 03/2018 with LVEF 55% with no need for repeat at this time -Wean off oxygen  Mild hypokalemia -Replete orally and recheck in a.m. along with magnesium  Chronic atrial fibrillation -Monitor on telemetry maintain on Coreg and Xarelto  CKD stage II -Monitor labs while on diuresis  OHS/OSA -Continue on nasal cannula at baseline with CPAP at night -Patient noted to be noncompliant with home CPAP  Chronic lymphedema -Noncompliant with treatment at home  Morbid obesity -Lifestyle modification   DVT prophylaxis: Xarelto Code Status: Full Family Communication: None at bedside Disposition Plan: Admit for IV diuresis Consults called: Spoke with his cardiologist Dr. Purvis SheffieldKoneswaran Admission status: Inpatient, telemetry   Kishawn Pickar D Sherryll BurgerShah DO [facility] Pager [phone]  If 7PM-7AM, please contact night-coverage [email or web address] Password [email or web address]  11/24/2018, 11:16 AM

## 2018-11-24 NOTE — ED Triage Notes (Signed)
Pt called ems because he states he can hardly breath or walk. Pt states he has a lot of fluid. Pt states he has been taking fluid pills but they do not work like IV lasix.

## 2018-11-24 NOTE — ED Notes (Signed)
Attempt report 

## 2018-11-24 NOTE — ED Provider Notes (Signed)
[facility] EMERGENCY DEPARTMENT Provider Note   CSN: [id number] Arrival date & time: 11/24/18  4098    History   Chief Complaint Chief Complaint  Patient presents with  . Shortness of Breath    HPI [name] is a 53 y.o. male.     Patient complains of swelling and shortness of breath.  Patient has severe congestive heart failure and is noncompliant with his medicines.  His weight now is 176 kg which was the same weight he was when he was admitted a couple weeks ago.  His discharge  weight was 163  The history is provided by the patient. No language interpreter was used.  Shortness of Breath  Severity:  Moderate Onset quality:  Gradual Timing:  Constant Progression:  Worsening Chronicity:  Recurrent Context: activity   Relieved by:  Nothing Worsened by:  Nothing Ineffective treatments:  None tried Associated symptoms: no abdominal pain, no chest pain, no cough, no headaches and no rash   Risk factors: no recent alcohol use     Past Medical History:  Diagnosis Date  . Allergic rhinitis   . Asthma   . Atrial fibrillation (HCC)   . CHF (congestive heart failure) (HCC)   . Elevated troponin level not due myocardial infarction 06/13/2015   cath w/ nl cors, felt 2nd HTN, CHF, peat trop 2.73  . Essential hypertension   . Gastroesophageal reflux disease   . History of cardiac catheterization    Normal coronaries December 2016  . Noncompliance    Major problem leading to declining health and recurrent hospitalization  . Nonischemic cardiomyopathy (HCC)    a. LVEF 20-25% in 2017 with cath in 2016 showing normal cors. b. EF 55% by echo in 03/2018.  . On home O2    3L N/C  . OSA (obstructive sleep apnea)   . Osteoarthritis   . Peptic ulcer disease     Patient Active Problem List   Diagnosis Date Noted  . Chronic obstructive pulmonary disease (HCC)   . Benign prostatic hyperplasia without lower urinary tract symptoms   . Acute on chronic combined systolic and  diastolic CHF (congestive heart failure) (HCC) 10/02/2018  . AKI (acute kidney injury) (HCC) 09/18/2018  . Acute on chronic combined systolic and diastolic congestive heart failure (HCC) 08/15/2018  . SOB (shortness of breath) 08/03/2018  . Volume overload 07/22/2018  . Acute systolic CHF (congestive heart failure) (HCC) 07/04/2018  . Acute on chronic combined systolic and diastolic heart failure (HCC) 04/01/2018  . Hypomagnesemia 04/01/2018  . Acute on chronic diastolic CHF (congestive heart failure) (HCC) 03/11/2018  . Pressure injury of skin 03/11/2018  . CHF (congestive heart failure) (HCC) 09/08/2017  . Altered mental status 05/11/2017  . Acute exacerbation of CHF (congestive heart failure) (HCC) 04/20/2017  . Acute on chronic systolic (congestive) heart failure (HCC) 04/19/2017  . CHF exacerbation (HCC) 04/11/2017  . Chronic respiratory failure with hypoxia (HCC) 04/11/2017  . Encounter for hospice care discussion   . Palliative care encounter   . Goals of care, counseling/discussion   . DNR (do not resuscitate)   . DNR (do not resuscitate) discussion   . Chronic congestive heart failure (HCC) 02/18/2017  . Acute systolic heart failure (HCC) 01/18/2017  . Pedal edema 12/02/2016  . Acute CHF (congestive heart failure) (HCC) 11/09/2016  . Acute on chronic systolic CHF (congestive heart failure) (HCC) 08/19/2016  . CKD (chronic kidney disease), stage II 05/17/2016  . Coronary arteries, normal Dec 2016 01/23/2016  .  Chronic anticoagulation-Xarelto 01/23/2016  . NSVT (nonsustained ventricular tachycardia) (HCC) 01/23/2016  . Elevated troponin 12/21/2015  . Nonischemic cardiomyopathy (HCC)   . Morbid obesity due to excess calories (HCC)   . Noncompliance with diet and medication regimen 12/02/2015  . OSA (obstructive sleep apnea) 09/20/2015  . Pulmonary hypertension (HCC) 09/19/2015  . Normocytic anemia 09/19/2015  . Atrial fibrillation, chronic   . Dyspnea on exertion  05/28/2015  . Acute respiratory failure with hypoxia (HCC) 04/01/2015  . Hypokalemia 04/01/2015  . Obesity hypoventilation syndrome (HCC) 08/08/2014  . GERD (gastroesophageal reflux disease) 08/08/2014  . Edema of right lower extremity 06/21/2014  . Fecal urgency 06/21/2014  . Asthma 02/11/2009  . Hyperlipidemia 07/12/2008  . OBESITY, MORBID 07/12/2008  . Anxiety state 07/12/2008  . DEPRESSION 07/12/2008  . Essential hypertension 07/12/2008  . ALLERGIC RHINITIS 07/12/2008  . Peptic ulcer 07/12/2008  . OSTEOARTHRITIS 07/12/2008    Past Surgical History:  Procedure Laterality Date  . CARDIAC CATHETERIZATION N/A 06/14/2015   Procedure: Right/Left Heart Cath and Coronary Angiography;  Surgeon: Runell Gess, MD;  Location: [facility] INVASIVE CV LAB;  Service: Cardiovascular;  Laterality: N/A;        Home Medications    Prior to Admission medications   Medication Sig Start Date End Date Taking? Authorizing Provider  acetaminophen (TYLENOL) 325 MG tablet Take 2 tablets (650 mg total) by mouth every 4 (four) hours as needed for headache or mild pain. 11/02/18   Calvert Cantor, MD  albuterol (PROVENTIL) (2.5 MG/3ML) 0.083% nebulizer solution Take 2.5 mg by nebulization every 6 (six) hours as needed for wheezing or shortness of breath.    [provider]  atorvastatin (LIPITOR) 40 MG tablet Take 40 mg by mouth daily.  09/05/18   [provider]  bumetanide (BUMEX) 2 MG tablet Take 1 tablet (2 mg total) by mouth 3 (three) times daily for 30 days. 11/12/18 12/12/18  Sherryll Burger, Pratik D, DO  carvedilol (COREG) 25 MG tablet Take 1 tablet (25 mg total) by mouth 2 (two) times daily with a meal for 30 days. 08/18/18 11/04/19  Sherryll Burger, Pratik D, DO  docusate sodium (COLACE) 100 MG capsule Take 1 capsule (100 mg total) by mouth 2 (two) times daily. Patient taking differently: Take 100 mg by mouth daily as needed for mild constipation or moderate constipation.  06/25/16   Kathlen Mody, MD  ferrous  gluconate (FERGON) 324 MG tablet Take 1 tablet (324 mg total) by mouth 2 (two) times daily with a meal. Patient taking differently: Take 324 mg by mouth daily.  06/25/16   Kathlen Mody, MD  fluticasone (FLOVENT HFA) 220 MCG/ACT inhaler Inhale 2 puffs into the lungs daily as needed (for shortness of breath).     [provider]  OXYGEN Inhale 3.5 L into the lungs continuous.     [provider]  pantoprazole (PROTONIX) 40 MG tablet Take 1 tablet (40 mg total) by mouth daily at 6 (six) AM. 06/26/16   Kathlen Mody, MD  potassium chloride SA (K-DUR) 20 MEQ tablet Take 2 tablets (40 mEq total) by mouth 2 (two) times daily. 10/07/18   Vassie Loll, MD  rivaroxaban (XARELTO) 20 MG TABS tablet Take 1 tablet (20 mg total) by mouth daily with breakfast. Patient taking differently: Take 20 mg by mouth daily with supper.  06/26/16   Kathlen Mody, MD  spironolactone (ALDACTONE) 100 MG tablet Take 100 mg by mouth daily. For fluid 03/18/17   [provider]  tamsulosin (FLOMAX) 0.4 MG  CAPS capsule Take 1 capsule (0.4 mg total) by mouth daily. 06/25/16   Kathlen Mody, MD  potassium chloride 20 MEQ TBCR Take 20 mEq by mouth 2 (two) times daily. 09/03/15 11/04/18  Ward, Layla Maw, DO    Family History Family History  Problem Relation Age of Onset  . Stroke Father   . Heart attack Father   . Aneurysm Mother        Cerebral aneurysm  . Hypertension Sister   . Colon cancer Neg Hx   . Inflammatory bowel disease Neg Hx   . Liver disease Neg Hx     Social History Social History   Tobacco Use  . Smoking status: Former Smoker    Packs/day: 0.50    Years: 20.00    Pack years: 10.00    Types: Cigarettes    Start date: 04/26/1988    Last attempt to quit: 06/23/2007    Years since quitting: 11.4  . Smokeless tobacco: Never Used  . Tobacco comment: 1 ppd former smoker  Substance Use Topics  . Alcohol use: No    Alcohol/week: 0.0 standard drinks    Comment: No etoh since 2009  . Drug  use: No     Allergies   Banana; Bee venom; Strawberry flavor; Aspirin; Metolazone; Oatmeal; Orange juice [orange oil]; Torsemide; Diltiazem; Hydralazine; and Lipitor [atorvastatin]   Review of Systems Review of Systems  Constitutional: Negative for appetite change and fatigue.  HENT: Negative for congestion, ear discharge and sinus pressure.   Eyes: Negative for discharge.  Respiratory: Positive for shortness of breath. Negative for cough.   Cardiovascular: Negative for chest pain.  Gastrointestinal: Negative for abdominal pain and diarrhea.  Genitourinary: Negative for frequency and hematuria.  Musculoskeletal: Negative for back pain.       Severe edema in legs  Skin: Negative for rash.  Neurological: Negative for seizures and headaches.  Psychiatric/Behavioral: Negative for hallucinations.     Physical Exam Updated Vital Signs BP (!) 152/99 (BP Location: Right Arm)   Pulse 65   Temp 98.1 F (36.7 C) (Oral)   Resp (!) 22   Ht 6' (1.829 m)   Wt (!) 176.2 kg   SpO2 96%   BMI 52.69 kg/m   Physical Exam Vitals signs and nursing note reviewed.  Constitutional:      Appearance: He is well-developed.  HENT:     Head: Normocephalic.     Nose: Nose normal.  Eyes:     General: No scleral icterus.    Conjunctiva/sclera: Conjunctivae normal.  Neck:     Musculoskeletal: Neck supple.     Thyroid: No thyromegaly.  Cardiovascular:     Rate and Rhythm: Normal rate and regular rhythm.     Heart sounds: No murmur. No friction rub. No gallop.   Pulmonary:     Breath sounds: No stridor. No wheezing or rales.  Chest:     Chest wall: No tenderness.  Abdominal:     General: There is no distension.     Tenderness: There is no abdominal tenderness. There is no rebound.  Musculoskeletal: Normal range of motion.        General: Swelling present.  Lymphadenopathy:     Cervical: No cervical adenopathy.  Skin:    Findings: No erythema or rash.  Neurological:     Mental Status:  He is alert and oriented to person, place, and time.     Motor: No abnormal muscle tone.     Coordination: Coordination normal.  Psychiatric:  Behavior: Behavior normal.      ED Treatments / Results  Labs (all labs ordered are listed, but only abnormal results are displayed) Labs Reviewed  BASIC METABOLIC PANEL - Abnormal; Notable for the following components:      Result Value   Potassium 3.4 (*)    Glucose, Bld 108 (*)    Calcium 8.8 (*)    All other components within normal limits  BRAIN NATRIURETIC PEPTIDE - Abnormal; Notable for the following components:   B Natriuretic Peptide 281.0 (*)    All other components within normal limits  SARS CORONAVIRUS 2 (HOSPITAL ORDER, PERFORMED IN [facility])  CBC WITH DIFFERENTIAL/PLATELET  TROPONIN I    EKG EKG Interpretation  Date/Time:  Thursday November 24 2018 06:13:44 EDT Ventricular Rate:  101 PR Interval:    QRS Duration: 98 QT Interval:  376 QTC Calculation: 488 R Axis:   79 Text Interpretation:  Atrial fibrillation Borderline prolonged QT interval When compared with ECG of 11/09/2018, No significant change was found Confirmed by [name] ([id number]) on 11/24/2018 6:28:50 AM Also confirmed by Bethann Berkshire [phone])  on 11/24/2018 8:54:24 AM   Radiology Dg Chest Portable 1 View  Result Date: 11/24/2018 CLINICAL DATA:  Increasing shortness of breath EXAM: PORTABLE CHEST 1 VIEW COMPARISON:  11/09/2018 FINDINGS: Cardiac shadow remains enlarged but stable. The lungs are well aerated bilaterally. Vascular congestion is again seen. No focal infiltrate is noted. No bony abnormality is seen. IMPRESSION: Mild vascular congestion.  No focal infiltrate is noted Electronically Signed   By: Alcide Clever M.D.   On: 11/24/2018 07:46    Procedures Procedures (including critical care time)  Medications Ordered in ED Medications  acetaminophen (TYLENOL) tablet 650 mg (650 mg Oral Given 11/24/18 0759)  furosemide (LASIX)  injection 80 mg (80 mg Intravenous Given 11/24/18 0844)     Initial Impression / Assessment and Plan / ED Course  I have reviewed the triage vital signs and the nursing notes.  Pertinent labs & imaging results that were available during my care of the patient were reviewed by me and considered in my medical decision making (see chart for details).        Patient will be admitted for anasarca and congestive heart failure for diuresis  Final Clinical Impressions(s) / ED Diagnoses   Final diagnoses:  Anasarca    ED Discharge Orders    None       Bethann Berkshire, MD 11/24/18 [phone]

## 2018-11-25 ENCOUNTER — Ambulatory Visit: Payer: Self-pay

## 2018-11-25 LAB — BASIC METABOLIC PANEL
Anion gap: 13 (ref 5–15)
BUN: 14 mg/dL (ref 6–20)
CO2: 28 mmol/L (ref 22–32)
Calcium: 8.8 mg/dL — ABNORMAL LOW (ref 8.9–10.3)
Chloride: 101 mmol/L (ref 98–111)
Creatinine, Ser: 1.11 mg/dL (ref 0.61–1.24)
GFR calc Af Amer: >60 mL/min (ref >60)
GFR calc non Af Amer: >60 mL/min (ref >60)
Glucose, Bld: 90 mg/dL (ref 70–99)
Potassium: 3.9 mmol/L (ref 3.5–5.1)
Sodium: 142 mmol/L (ref 135–145)

## 2018-11-25 LAB — CBC
HCT: 43.6 % (ref 39.0–52.0)
Hemoglobin: 13.5 g/dL (ref 13.0–17.0)
MCH: 27.6 pg (ref 26.0–34.0)
MCHC: 31 g/dL (ref 30.0–36.0)
MCV: 89.2 fL (ref 80.0–100.0)
Platelets: 187 10*3/uL (ref 150–400)
RBC: 4.89 MIL/uL (ref 4.22–5.81)
RDW: 14.4 % (ref 11.5–15.5)
WBC: 4.6 10*3/uL (ref 4.0–10.5)
nRBC: 0 % (ref 0.0–0.2)

## 2018-11-25 LAB — MAGNESIUM: Magnesium: 2.1 mg/dL (ref 1.7–2.4)

## 2018-11-25 NOTE — Progress Notes (Signed)
Notified by telemetry patient had seven beat run of Vtach. Vital signs obtained and within normal limits. Denies chest pain. Mid level notified. Will continue to monitor.

## 2018-11-25 NOTE — TOC Initial Note (Signed)
Transition of Care [facility]) - Initial/Assessment Note    Patient Details  Name: [name] MRN: [id number] Date of Birth: Sep 11, 1965  Transition of Care [facility] CM/SW Contact:    Annice Needy, LCSW Phone Number: 11/25/2018, 4:10 PM  Clinical Narrative:                 Patient is well known to TOC due to repeated admissions. Patient is active with [facility] and reports that a nurse calls every other day to check his weight.  Discussed frequent admissions. Patient is adamant that his at home medications are not the same as the medications that he receives in the hospital. LCSW discussed with patient notes in the system that he reported that he has not been taking his medication as prescribed and that he has been eating unhealthy foods. Patient denied that he eats an abundance of unhealthy foods.  He stated that he medications do not work at home and there is "no way to take then like I do here." Patient states that his at home medications make him have swelling in his lower legs. LCSW questioned if patient's food choices and failure to take medications as prescribed could be more of the issue causing his swelling rather than the actual medications causing him to swell.  Patient stated "It's impossible." Patient stated that the medications do not work likely because they are generic (patient has been given brand name medications in the past and had the same issues).  Patient stated that his blood pressure increases his blood pressure, his cholesterol medications cause his nose to bleed and his diuretics cause him to swell. Patient also mentioned another medication caused him to go blind for about 25-30 minutes.  LCSW discussed counseling/psychiatry. Patient stated "so you think I need counseling because you don't think I'm taking my medication." LCSW discussed notes in the chart that indicated that patient has a history of non compliance and that there may be underlying issues creating these issues. Patient is  not interested in counseling/psychiatry at this time.  Patient is interested in [facility]. Referral made.   Expected Discharge Plan: Home w Home Health Services Barriers to Discharge: No Barriers Identified   Patient Goals and CMS Choice   CMS Medicare.gov Compare Post Acute Care list provided to:: Patient Choice offered to / list presented to : Patient  Expected Discharge Plan and Services Expected Discharge Plan: Home w Home Health Services       Living arrangements for the past 2 months: Single Family Home                           HH Arranged: RN [facility] Agency: [facility] Date [facility] Agency Contacted: 11/25/18 Time HH Agency Contacted: 1609 Representative spoke with at [facility] Agency: Eugenio Hoes  Prior Living Arrangements/Services Living arrangements for the past 2 months: Single Family Home Lives with:: Parents Patient language and need for interpreter reviewed:: Yes Do you feel safe going back to the place where you live?: Yes      Need for Family Participation in Patient Care: Yes (Comment) Care giver support system in place?: Yes (comment)   Criminal Activity/Legal Involvement Pertinent to Current Situation/Hospitalization: No - Comment as needed  Activities of Daily Living Home Assistive Devices/Equipment: None ADL Screening (condition at time of admission) Patient's cognitive ability adequate to safely complete daily activities?: Yes Is the patient deaf or have difficulty hearing?: No Does the patient have difficulty seeing,  even when wearing glasses/contacts?: No Does the patient have difficulty concentrating, remembering, or making decisions?: No Patient able to express need for assistance with ADLs?: Yes Does the patient have difficulty dressing or bathing?: No Independently performs ADLs?: Yes (appropriate for developmental age) Does the patient have difficulty walking or climbing stairs?: Yes Weakness of Legs: Both Weakness of Arms/Hands: None  Permission  Sought/Granted Permission sought to share information with : PCP                Emotional Assessment Appearance:: Appears stated age   Affect (typically observed): Appropriate Orientation: : Oriented to Self, Oriented to Place, Oriented to  Time, Oriented to Situation Alcohol / Substance Use: Not Applicable Psych Involvement: No (comment)  Admission diagnosis:  Anasarca [R60.1] Patient Active Problem List   Diagnosis Date Noted  . Chronic obstructive pulmonary disease (HCC)   . Benign prostatic hyperplasia without lower urinary tract symptoms   . Acute on chronic combined systolic and diastolic CHF (congestive heart failure) (HCC) 10/02/2018  . AKI (acute kidney injury) (HCC) 09/18/2018  . Acute on chronic combined systolic and diastolic congestive heart failure (HCC) 08/15/2018  . SOB (shortness of breath) 08/03/2018  . Volume overload 07/22/2018  . Acute systolic CHF (congestive heart failure) (HCC) 07/04/2018  . Acute on chronic combined systolic and diastolic heart failure (HCC) 04/01/2018  . Hypomagnesemia 04/01/2018  . Acute on chronic diastolic CHF (congestive heart failure) (HCC) 03/11/2018  . Pressure injury of skin 03/11/2018  . CHF (congestive heart failure) (HCC) 09/08/2017  . Altered mental status 05/11/2017  . Acute exacerbation of CHF (congestive heart failure) (HCC) 04/20/2017  . Acute on chronic systolic (congestive) heart failure (HCC) 04/19/2017  . CHF exacerbation (HCC) 04/11/2017  . Chronic respiratory failure with hypoxia (HCC) 04/11/2017  . Encounter for hospice care discussion   . Palliative care encounter   . Goals of care, counseling/discussion   . DNR (do not resuscitate)   . DNR (do not resuscitate) discussion   . Chronic congestive heart failure (HCC) 02/18/2017  . Acute systolic heart failure (HCC) 01/18/2017  . Pedal edema 12/02/2016  . Acute CHF (congestive heart failure) (HCC) 11/09/2016  . Acute on chronic systolic CHF (congestive heart  failure) (HCC) 08/19/2016  . CKD (chronic kidney disease), stage II 05/17/2016  . Coronary arteries, normal Dec 2016 01/23/2016  . Chronic anticoagulation-Xarelto 01/23/2016  . NSVT (nonsustained ventricular tachycardia) (HCC) 01/23/2016  . Elevated troponin 12/21/2015  . Nonischemic cardiomyopathy (HCC)   . Morbid obesity due to excess calories (HCC)   . Noncompliance with diet and medication regimen 12/02/2015  . OSA (obstructive sleep apnea) 09/20/2015  . Pulmonary hypertension (HCC) 09/19/2015  . Normocytic anemia 09/19/2015  . Atrial fibrillation, chronic   . Dyspnea on exertion 05/28/2015  . Acute respiratory failure with hypoxia (HCC) 04/01/2015  . Hypokalemia 04/01/2015  . Obesity hypoventilation syndrome (HCC) 08/08/2014  . GERD (gastroesophageal reflux disease) 08/08/2014  . Edema of right lower extremity 06/21/2014  . Fecal urgency 06/21/2014  . Asthma 02/11/2009  . Hyperlipidemia 07/12/2008  . OBESITY, MORBID 07/12/2008  . Anxiety state 07/12/2008  . DEPRESSION 07/12/2008  . Essential hypertension 07/12/2008  . ALLERGIC RHINITIS 07/12/2008  . Peptic ulcer 07/12/2008  . OSTEOARTHRITIS 07/12/2008   PCP:  Pearson Grippe, MD Pharmacy:   [phone] - [place], [place] - [address] [place] [address] Phone: [phone] Fax: [phone]     Social Determinants of  Health (SDOH) Interventions    Readmission Risk Interventions Readmission Risk Prevention Plan 11/11/2018 11/02/2018 10/07/2018  Transportation Screening Complete Complete Complete  Medication Review Oceanographer) Complete Complete Complete  PCP or Specialist appointment within 3-5 days of discharge - Complete Complete  HRI or Home Care Consult Not Complete Not Complete -  HRI or Home Care Consult Pt Refusal Comments not ordered, no HH company will accept pt any longer not ordered nor recommended -  SW Recovery Care/Counseling Consult Complete  Complete Complete  Palliative Care Screening Not Applicable Not Applicable Not Applicable  Skilled Nursing Facility Not Applicable Not Applicable Not Applicable  Some recent data might be hidden

## 2018-11-25 NOTE — Progress Notes (Signed)
PROGRESS NOTE    [name]  TMH:[id number] DOB: 1965/12/19 DOA: 11/24/2018 PCP: Samuel Grippe, MD   Brief Narrative:  Per HPI: [name] is a 53 y.o. male with medical history significant for chronic combined systolic and diastolic CHF with recurrent readmissions due to dietary medication noncompliance, atrial fibrillation on Xarelto, hypertension, MI, asthma, OSA on CPAP, and morbid obesity who presents back to the emergency department after recent discharge on 5/23 for acute heart failure exacerbation at that time.  His discharge weight recently was 163 EKG and he is currently at 176 kg.  Per outpatient notes, patient was discharged on Bumex 3 times daily as recommended by his cardiologist, but he went back to taking Lasix 80 mg twice a day with an additional dose of 40 mg midday.  He states that his medications are ineffective and he was also noted to be having a hamburger at the time of one of the outreach calls.  Patient is chronically noncompliant with medications and diet.  He has had worsening lower extremity edema and shortness of breath today which prompted the ED visit.  Patient denies any fevers, chills, or cough.   Patient was admitted for recurrent acute on chronic combined CHF exacerbation and has been started on Lasix 80 mg IV twice daily.  He was admitted with initial weight of 176 kg and is currently at approximately 167 kg this morning with approximately 8 L of net negative fluid balance noted.  He continues to improve on 6/5.  Assessment & Plan:   Principal Problem:   Acute on chronic combined systolic and diastolic CHF (congestive heart failure) (HCC) Active Problems:   Atrial fibrillation, chronic   Pulmonary hypertension (HCC)   OSA (obstructive sleep apnea)   CKD (chronic kidney disease), stage II   Acute on chronic combined CHF exacerbation-improving -Secondary to medication and dietary noncompliance -Lasix 80 mg IV twice daily with home spironolactone and  monitor daily weights and I's and O's -Goal weight on discharge 163 KG -Prior 2D echocardiogram 03/2018 with LVEF 55% with no need for repeat at this time -Wean off oxygen, but does wear at baseline  Mild hypokalemia-resolved -recheck in a.m. along with magnesium  Chronic atrial fibrillation -Monitor on telemetry maintain on Coreg and Xarelto  CKD stage II -Monitor labs while on diuresis  OHS/OSA -Continue on nasal cannula at baseline with CPAP at night -Patient noted to be noncompliant with home CPAP  Chronic lymphedema -Noncompliant with treatment at home  Morbid obesity -Lifestyle modification   DVT prophylaxis: Xarelto Code Status: Full Family Communication: None at bedside Disposition Plan: Continue IV diuresis to weight of 163 KG.  Anticipate discharge by a.m. if stable.  Appreciate CSW help.   Consultants:   None  Procedures:   None  Antimicrobials:   None   Subjective: Patient seen and evaluated today with no new acute complaints or concerns. No acute concerns or events noted overnight.  Continues to have mild dyspnea this morning.  Objective: Vitals:   11/24/18 2100 11/25/18 0500 11/25/18 0642 11/25/18 0811  BP: 105/73  130/80   Pulse: 83  88   Resp: 18  17   Temp: 98.2 F (36.8 C)  98.2 F (36.8 C)   TempSrc: Oral  Oral   SpO2: 100%  100% 94%  Weight:  (!) 166.4 kg    Height:        Intake/Output Summary (Last 24 hours) at 11/25/2018 0934 Last data filed at 11/25/2018 0500 Gross per 24  hour  Intake 243 ml  Output 7750 ml  Net -7507 ml   Filed Weights   11/24/18 0831 11/24/18 1500 11/25/18 0500  Weight: (!) 176.2 kg (!) 172.2 kg (!) 166.4 kg    Examination:  General exam: Appears calm and comfortable, obese Respiratory system: Clear to auscultation. Respiratory effort normal. Cardiovascular system: S1 & S2 heard, RRR. No JVD, murmurs, rubs, gallops or clicks.  Scant pedal edema. Gastrointestinal system: Abdomen is nondistended,  soft and nontender. No organomegaly or masses felt. Normal bowel sounds heard. Central nervous system: Alert and oriented. No focal neurological deficits. Extremities: Symmetric 5 x 5 power. Skin: No rashes, lesions or ulcers Psychiatry: Judgement and insight appear normal. Mood & affect appropriate.     Data Reviewed: I have personally reviewed following labs and imaging studies  CBC: Recent Labs  Lab 11/24/18 0634 11/25/18 0505  WBC 4.3 4.6  NEUTROABS 2.9  --   HGB 13.1 13.5  HCT 40.7 43.6  MCV 87.9 89.2  PLT 181 187   Basic Metabolic Panel: Recent Labs  Lab 11/24/18 0634 11/25/18 0505  NA 138 142  K 3.4* 3.9  CL 104 101  CO2 24 28  GLUCOSE 108* 90  BUN 14 14  CREATININE 1.05 1.11  CALCIUM 8.8* 8.8*  MG  --  2.1   GFR: Estimated Creatinine Clearance: 127.4 mL/min (by C-G formula based on SCr of 1.11 mg/dL). Liver Function Tests: No results for input(s): AST, ALT, ALKPHOS, BILITOT, PROT, ALBUMIN in the last 168 hours. No results for input(s): LIPASE, AMYLASE in the last 168 hours. No results for input(s): AMMONIA in the last 168 hours. Coagulation Profile: No results for input(s): INR, PROTIME in the last 168 hours. Cardiac Enzymes: Recent Labs  Lab 11/24/18 0634  TROPONINI <0.03   BNP (last 3 results) No results for input(s): PROBNP in the last 8760 hours. HbA1C: No results for input(s): HGBA1C in the last 72 hours. CBG: No results for input(s): GLUCAP in the last 168 hours. Lipid Profile: No results for input(s): CHOL, HDL, LDLCALC, TRIG, CHOLHDL, LDLDIRECT in the last 72 hours. Thyroid Function Tests: No results for input(s): TSH, T4TOTAL, FREET4, T3FREE, THYROIDAB in the last 72 hours. Anemia Panel: No results for input(s): VITAMINB12, FOLATE, FERRITIN, TIBC, IRON, RETICCTPCT in the last 72 hours. Sepsis Labs: No results for input(s): PROCALCITON, LATICACIDVEN in the last 168 hours.  Recent Results (from the past 240 hour(s))  SARS Coronavirus 2  (CEPHEID- Performed in [facility] hospital lab), Hosp Order     Status: None   Collection Time: 11/24/18  7:17 AM  Result Value Ref Range Status   SARS Coronavirus 2 NEGATIVE NEGATIVE Final    Comment: (NOTE) If result is NEGATIVE SARS-CoV-2 target nucleic acids are NOT DETECTED. The SARS-CoV-2 RNA is generally detectable in upper and lower  respiratory specimens during the acute phase of infection. The lowest  concentration of SARS-CoV-2 viral copies this assay can detect is 250  copies / mL. A negative result does not preclude SARS-CoV-2 infection  and should not be used as the sole basis for treatment or other  patient management decisions.  A negative result may occur with  improper specimen collection / handling, submission of specimen other  than nasopharyngeal swab, presence of viral mutation(s) within the  areas targeted by this assay, and inadequate number of viral copies  (<250 copies / mL). A negative result must be combined with clinical  observations, patient history, and epidemiological information. If result is POSITIVE  SARS-CoV-2 target nucleic acids are DETECTED. The SARS-CoV-2 RNA is generally detectable in upper and lower  respiratory specimens dur ing the acute phase of infection.  Positive  results are indicative of active infection with SARS-CoV-2.  Clinical  correlation with patient history and other diagnostic information is  necessary to determine patient infection status.  Positive results do  not rule out bacterial infection or co-infection with other viruses. If result is PRESUMPTIVE POSTIVE SARS-CoV-2 nucleic acids MAY BE PRESENT.   A presumptive positive result was obtained on the submitted specimen  and confirmed on repeat testing.  While 2019 novel coronavirus  (SARS-CoV-2) nucleic acids may be present in the submitted sample  additional confirmatory testing may be necessary for epidemiological  and / or clinical management purposes  to differentiate  between  SARS-CoV-2 and other Sarbecovirus currently known to infect humans.  If clinically indicated additional testing with an alternate test  methodology [phone]) is advised. The SARS-CoV-2 RNA is generally  detectable in upper and lower respiratory sp ecimens during the acute  phase of infection. The expected result is Negative. Fact Sheet for Patients:  BoilerBrush.com.cy Fact Sheet for Healthcare Providers: https://pope.com/ This test is not yet approved or cleared by the [place] FDA and has been authorized for detection and/or diagnosis of SARS-CoV-2 by FDA under an Emergency Use Authorization (EUA).  This EUA will remain in effect (meaning this test can be used) for the duration of the COVID-19 declaration under Section 564(b)(1) of the Act, 21 U.S.C. section 360bbb-3(b)(1), unless the authorization is terminated or revoked sooner. Performed at [facility], [address], [place], [place] [address]          Radiology Studies: Dg Chest Portable 1 View  Result Date: 11/24/2018 CLINICAL DATA:  Increasing shortness of breath EXAM: PORTABLE CHEST 1 VIEW COMPARISON:  11/09/2018 FINDINGS: Cardiac shadow remains enlarged but stable. The lungs are well aerated bilaterally. Vascular congestion is again seen. No focal infiltrate is noted. No bony abnormality is seen. IMPRESSION: Mild vascular congestion.  No focal infiltrate is noted Electronically Signed   By: Alcide Clever M.D.   On: 11/24/2018 07:46        Scheduled Meds: . atorvastatin  40 mg Oral Daily  . budesonide (PULMICORT) nebulizer solution  0.5 mg Nebulization BID  . carvedilol  25 mg Oral BID WC  . ferrous gluconate  324 mg Oral BID WC  . furosemide  80 mg Intravenous Q12H  . pantoprazole  40 mg Oral Q0600  . potassium chloride SA  40 mEq Oral BID  . rivaroxaban  20 mg Oral Q supper  . sodium chloride flush  3 mL Intravenous Q12H  . spironolactone  100 mg  Oral Daily  . tamsulosin  0.4 mg Oral Daily   Continuous Infusions: . sodium chloride       LOS: 1 day    Time spent: 30 minutes    Harvest Stanco Hoover Brunette, DO [facility] Pager [phone]  If 7PM-7AM, please contact night-coverage [email or web address] Password [facility] 11/25/2018, 9:34 AM

## 2018-11-25 NOTE — Care Management Important Message (Signed)
Important Message  Patient Details  Name: [name] MRN: [id number] Date of Birth: [birth year]   Medicare Important Message Given:  Yes    Corey Harold 11/25/2018, 1:18 PM

## 2018-11-26 ENCOUNTER — Other Ambulatory Visit: Payer: Self-pay

## 2018-11-26 ENCOUNTER — Encounter (HOSPITAL_COMMUNITY): Payer: Self-pay | Admitting: Emergency Medicine

## 2018-11-26 ENCOUNTER — Emergency Department (HOSPITAL_COMMUNITY)
Admission: EM | Admit: 2018-11-26 | Discharge: 2018-11-26 | Disposition: A | Discharge status: Home / Self Care | Payer: Medicare Other | Attending: Emergency Medicine | Admitting: Emergency Medicine

## 2018-11-26 DIAGNOSIS — Z79899 Other long term (current) drug therapy: Secondary | ICD-10-CM | POA: Insufficient documentation

## 2018-11-26 DIAGNOSIS — I5042 Chronic combined systolic (congestive) and diastolic (congestive) heart failure: Secondary | ICD-10-CM | POA: Diagnosis not present

## 2018-11-26 DIAGNOSIS — Z87891 Personal history of nicotine dependence: Secondary | ICD-10-CM | POA: Insufficient documentation

## 2018-11-26 DIAGNOSIS — I890 Lymphedema, not elsewhere classified: Secondary | ICD-10-CM | POA: Diagnosis not present

## 2018-11-26 DIAGNOSIS — N182 Chronic kidney disease, stage 2 (mild): Secondary | ICD-10-CM | POA: Diagnosis not present

## 2018-11-26 DIAGNOSIS — I4891 Unspecified atrial fibrillation: Secondary | ICD-10-CM | POA: Insufficient documentation

## 2018-11-26 DIAGNOSIS — Z7901 Long term (current) use of anticoagulants: Secondary | ICD-10-CM | POA: Diagnosis not present

## 2018-11-26 DIAGNOSIS — J449 Chronic obstructive pulmonary disease, unspecified: Secondary | ICD-10-CM | POA: Insufficient documentation

## 2018-11-26 DIAGNOSIS — I130 Hypertensive heart and chronic kidney disease with heart failure and stage 1 through stage 4 chronic kidney disease, or unspecified chronic kidney disease: Secondary | ICD-10-CM | POA: Insufficient documentation

## 2018-11-26 LAB — BASIC METABOLIC PANEL
Anion gap: 10 (ref 5–15)
BUN: 18 mg/dL (ref 6–20)
CO2: 29 mmol/L (ref 22–32)
Calcium: 9 mg/dL (ref 8.9–10.3)
Chloride: 100 mmol/L (ref 98–111)
Creatinine, Ser: 1.23 mg/dL (ref 0.61–1.24)
GFR calc Af Amer: >60 mL/min (ref >60)
GFR calc non Af Amer: >60 mL/min (ref >60)
Glucose, Bld: 96 mg/dL (ref 70–99)
Potassium: 3.9 mmol/L (ref 3.5–5.1)
Sodium: 139 mmol/L (ref 135–145)

## 2018-11-26 LAB — MAGNESIUM: Magnesium: 2.1 mg/dL (ref 1.7–2.4)

## 2018-11-26 MED ORDER — SILVER SULFADIAZINE 1 % EX CREA
TOPICAL_CREAM | Freq: Once | CUTANEOUS | Status: AC
  Administered 2018-11-26: 1 via TOPICAL
  Filled 2018-11-26: qty 50

## 2018-11-26 MED ORDER — SILVER SULFADIAZINE 1 % EX CREA: 1.0000 "application " | TOPICAL_CREAM | Freq: Every day | CUTANEOUS | 0 refills | Status: DC

## 2018-11-26 NOTE — Discharge Instructions (Addendum)
You may apply a thin layer of the silvadene cream daily to your areas of skin breakdown from your chronic leg swelling.  Elevation and gentle pressure dressings can also be helpful.  As discussed, plan to see your doctor this week as you are planning - make sure to discuss your skin concerns - you may benefit from a referral to a wound care specialist for other treatment options.

## 2018-11-26 NOTE — ED Triage Notes (Addendum)
Patient brought in via EMS from home. Alert and oriented. Airway patent. Patient c/o swelling and serosanguinous drainage to legs bilaterally (perdiomently right leg). Patient discharged from our facility this morning after being admitted for 3 days for CHF. Patient taking diuretic. Patient states had dressing removed this morning but not replaced, now legs painful to touch and drainage again. Patient reports pain with ambulating. Patient denies chest pain but reports shortness of breath.

## 2018-11-26 NOTE — Discharge Summary (Signed)
Physician Discharge Summary  Samuel KlippelMark T Fitzpatrick ZOX:[id number] DOB: 07/22/1965 DOA: 11/24/2018  PCP: Pearson GrippeKim, [name], MD  Admit date: 11/24/2018  Discharge date: 11/26/2018  Admitted From:Home  Disposition:  Home  Recommendations for Outpatient Follow-up:  1. Follow up with PCP in 1-2 weeks 2. Follow-up with cardiology as scheduled with tele-visits 3. Patient has agreed to home health nursing and they will check in on him regularly 4. Continue on Bumex 2 mg 3 times daily as prior as well as spironolactone and monitor daily weights 5. Reinforced diet and fluid restriction as well  Home Health: Yes with RN  Equipment/Devices: None, patient has home nasal cannula-but appears noncompliant with using it  Discharge Condition: Stable  CODE STATUS: Full  Diet recommendation: Heart Healthy, 1500 mL fluid restriction  Brief/Interim Summary: Per HPI: Samuel T Archeris a 53 y.o.malewith medical history significant forchronic combined systolic and diastolic CHF with recurrent readmissions due to dietary medication noncompliance, atrial fibrillation on Xarelto, hypertension, MI, asthma, OSA on CPAP, and morbid obesity who presents back to the emergency department after recent discharge on5/23 for acute heart failure exacerbation at that time. His discharge weight recently was 163 EKG and he is currently at 176 kg. Per outpatient notes, patient was discharged on Bumex 3 times daily as recommended by his cardiologist, but he went back to taking Lasix 80 mg twice a day with an additional dose of 40 mg midday. He states that his medications are ineffective and he was also noted to be having a hamburger at the time of oneof the outreach calls. Patient is chronically noncompliant with medications and diet. He has had worsening lower extremity edema and shortness of breath today which prompted the ED visit. Patient denies any fevers, chills, or cough.  Patient was admitted for recurrent acute on chronic  combined CHF exacerbation and has been started on Lasix 80 mg IV twice daily.  He was admitted with initial weight of 176 kg he has diuresed back to his baseline of 163.2 kg today with total net fluid balance of approximately -13.2 L while on Lasix 80 mg IV twice daily along with spironolactone.  He was seen by CSW and offered psychiatric assessment as well as behavioral health outpatient, but patient had refused.  He is agreeable to home health services with RN to assist with medications and monitoring.  His last echocardiogram was in 03/2018 with LVEF 55%.  He is noted to have some chronic lymphedema and drainage to his right leg which will need dressing changes per RN home health visits.  He remains a high risk for readmission on a frequent basis due to his dietary and medication noncompliance.  Discharge Diagnoses:  Principal Problem:   Acute on chronic combined systolic and diastolic CHF (congestive heart failure) (HCC) Active Problems:   Atrial fibrillation, chronic   Pulmonary hypertension (HCC)   OSA (obstructive sleep apnea)   CKD (chronic kidney disease), stage II  Principal discharge diagnosis: Recurrent, acute on chronic combined CHF decompensation secondary to dietary and medication noncompliance.  Discharge Instructions  Discharge Instructions    Diet - low sodium heart healthy   Complete by:  As directed    Increase activity slowly   Complete by:  As directed      Allergies as of 11/26/2018      Reactions   Banana Shortness Of Breath   Bee Venom Shortness Of Breath, Swelling, Other (See Comments)   Reaction:  Facial swelling   Strawberry Flavor Shortness Of Breath, Rash  Aspirin Other (See Comments)   Reaction:  GI upset    Metolazone Other (See Comments)   Pt states that he stopped taking this med due to heart attack like symptoms.    Oatmeal Nausea And Vomiting   Orange Juice [orange Oil] Nausea And Vomiting   All acidic products make him nauseous and upset stomach    Torsemide Swelling, Other (See Comments)   Reaction:  Swelling of feet/legs    Diltiazem Palpitations   Hydralazine Palpitations   Lipitor [atorvastatin] Other (See Comments)   Reaction:  Nose bleeds : Patient is currently taking this medication      Medication List    TAKE these medications   acetaminophen 325 MG tablet Commonly known as:  TYLENOL Take 2 tablets (650 mg total) by mouth every 4 (four) hours as needed for headache or mild pain.   albuterol (2.5 MG/3ML) 0.083% nebulizer solution Commonly known as:  PROVENTIL Take 2.5 mg by nebulization every 6 (six) hours as needed for wheezing or shortness of breath.   atorvastatin 40 MG tablet Commonly known as:  LIPITOR Take 40 mg by mouth daily.   bumetanide 2 MG tablet Commonly known as:  BUMEX Take 1 tablet (2 mg total) by mouth 3 (three) times daily for 30 days.   carvedilol 25 MG tablet Commonly known as:  COREG Take 1 tablet (25 mg total) by mouth 2 (two) times daily with a meal for 30 days.   docusate sodium 100 MG capsule Commonly known as:  COLACE Take 1 capsule (100 mg total) by mouth 2 (two) times daily. What changed:  when to take this   ferrous gluconate 324 MG tablet Commonly known as:  FERGON Take 1 tablet (324 mg total) by mouth 2 (two) times daily with a meal. What changed:  when to take this   fluticasone 220 MCG/ACT inhaler Commonly known as:  FLOVENT HFA Inhale 2 puffs into the lungs daily as needed (for shortness of breath).   lisinopril 10 MG tablet Commonly known as:  ZESTRIL Take 10 mg by mouth daily.   OXYGEN Inhale 3.5 L into the lungs continuous.   pantoprazole 40 MG tablet Commonly known as:  PROTONIX Take 1 tablet (40 mg total) by mouth daily at 6 (six) AM.   potassium chloride SA 20 MEQ tablet Commonly known as:  K-DUR Take 2 tablets (40 mEq total) by mouth 2 (two) times daily.   rivaroxaban 20 MG Tabs tablet Commonly known as:  XARELTO Take 1 tablet (20 mg total) by  mouth daily with breakfast. What changed:  when to take this   spironolactone 100 MG tablet Commonly known as:  ALDACTONE Take 100 mg by mouth daily. For fluid   tamsulosin 0.4 MG Caps capsule Commonly known as:  FLOMAX Take 1 capsule (0.4 mg total) by mouth daily.      Follow-up Information    Pearson Grippe, MD Follow up.   Specialty:  Internal Medicine Contact information: [address] [place] [address] [phone]        [name], MD .   Specialty:  Cardiology Contact information: [address] [place] [address] [phone]          Allergies  Allergen Reactions  . Banana Shortness Of Breath  . Bee Venom Shortness Of Breath, Swelling and Other (See Comments)    Reaction:  Facial swelling  . Strawberry Flavor Shortness Of Breath and Rash  . Aspirin Other (See Comments)  Reaction:  GI upset   . Metolazone Other (See Comments)    Pt states that he stopped taking this med due to heart attack like symptoms.   . Oatmeal Nausea And Vomiting  . Orange Juice [Orange Oil] Nausea And Vomiting    All acidic products make him nauseous and upset stomach  . Torsemide Swelling and Other (See Comments)    Reaction:  Swelling of feet/legs   . Diltiazem Palpitations  . Hydralazine Palpitations  . Lipitor [Atorvastatin] Other (See Comments)    Reaction:  Nose bleeds : Patient is currently taking this medication    Consultations:  None   Procedures/Studies: Dg Chest 2 View  Result Date: 11/09/2018 CLINICAL DATA:  Shortness of breath for 2 days EXAM: CHEST - 2 VIEW COMPARISON:  10/30/2018 FINDINGS: Cardiac shadow is enlarged. Vascular congestion is noted as well as patchy infiltrate over the mid and lower lung on the right consistent with lower lobe infiltrate. No sizable effusion is noted. No bony abnormality is noted. IMPRESSION: Right lower lobe infiltrate. Mild vascular congestion remains. Electronically Signed   By: Alcide CleverMark  Lukens  M.D.   On: 11/09/2018 07:26   Dg Chest Portable 1 View  Result Date: 11/24/2018 CLINICAL DATA:  Increasing shortness of breath EXAM: PORTABLE CHEST 1 VIEW COMPARISON:  11/09/2018 FINDINGS: Cardiac shadow remains enlarged but stable. The lungs are well aerated bilaterally. Vascular congestion is again seen. No focal infiltrate is noted. No bony abnormality is seen. IMPRESSION: Mild vascular congestion.  No focal infiltrate is noted Electronically Signed   By: Alcide CleverMark  Lukens M.D.   On: 11/24/2018 07:46   Dg Chest Portable 1 View  Result Date: 10/30/2018 CLINICAL DATA:  Short of breath. EXAM: PORTABLE CHEST 1 VIEW COMPARISON:  10/24/2018 FINDINGS: Stable enlarged cardiac silhouette. Mild central venous congestion. No overt pulmonary edema. No pneumothorax. IMPRESSION: Cardiomegaly and central venous congestion. Electronically Signed   By: Genevive BiStewart  Edmunds M.D.   On: 10/30/2018 06:34     Discharge Exam: Vitals:   11/25/18 2107 11/26/18 0520  BP:  135/88  Pulse: 73 61  Resp: 20 20  Temp:  98.2 F (36.8 C)  SpO2:  99%   Vitals:   11/25/18 1927 11/25/18 2103 11/25/18 2107 11/26/18 0520  BP:  111/81  135/88  Pulse:  (!) 39 73 61  Resp:  20 20 20   Temp:  97.8 F (36.6 C)  98.2 F (36.8 C)  TempSrc:  Oral  Oral  SpO2: 99% 98%  99%  Weight:    (!) 163.2 kg  Height:        General: Pt is alert, awake, not in acute distress, morbid obesity Cardiovascular: RRR, S1/S2 +, no rubs, no gallops Respiratory: CTA bilaterally, no wheezing, no rhonchi Abdominal: Soft, NT, ND, bowel sounds + Extremities: Scant edema with chronic skin changes and lymphedema, no cyanosis    The results of significant diagnostics from this hospitalization (including imaging, microbiology, ancillary and laboratory) are listed below for reference.     Microbiology: Recent Results (from the past 240 hour(s))  SARS Coronavirus 2 (CEPHEID- Performed in [facility] lab), Hosp Order     Status: None    Collection Time: 11/24/18  7:17 AM  Result Value Ref Range Status   SARS Coronavirus 2 NEGATIVE NEGATIVE Final    Comment: (NOTE) If result is NEGATIVE SARS-CoV-2 target nucleic acids are NOT DETECTED. The SARS-CoV-2 RNA is generally detectable in upper and lower  respiratory specimens during the acute phase of infection.  The lowest  concentration of SARS-CoV-2 viral copies this assay can detect is 250  copies / mL. A negative result does not preclude SARS-CoV-2 infection  and should not be used as the sole basis for treatment or other  patient management decisions.  A negative result may occur with  improper specimen collection / handling, submission of specimen other  than nasopharyngeal swab, presence of viral mutation(s) within the  areas targeted by this assay, and inadequate number of viral copies  (<250 copies / mL). A negative result must be combined with clinical  observations, patient history, and epidemiological information. If result is POSITIVE SARS-CoV-2 target nucleic acids are DETECTED. The SARS-CoV-2 RNA is generally detectable in upper and lower  respiratory specimens dur ing the acute phase of infection.  Positive  results are indicative of active infection with SARS-CoV-2.  Clinical  correlation with patient history and other diagnostic information is  necessary to determine patient infection status.  Positive results do  not rule out bacterial infection or co-infection with other viruses. If result is PRESUMPTIVE POSTIVE SARS-CoV-2 nucleic acids MAY BE PRESENT.   A presumptive positive result was obtained on the submitted specimen  and confirmed on repeat testing.  While 2019 novel coronavirus  (SARS-CoV-2) nucleic acids may be present in the submitted sample  additional confirmatory testing may be necessary for epidemiological  and / or clinical management purposes  to differentiate between  SARS-CoV-2 and other Sarbecovirus currently known to infect humans.   If clinically indicated additional testing with an alternate test  methodology [phone]) is advised. The SARS-CoV-2 RNA is generally  detectable in upper and lower respiratory sp ecimens during the acute  phase of infection. The expected result is Negative. Fact Sheet for Patients:  BoilerBrush.com.cyhttps://www.fda.gov/media/136312/download Fact Sheet for Healthcare Providers: https://pope.com/https://www.fda.gov/media/136313/download This test is not yet approved or cleared by the [place] FDA and has been authorized for detection and/or diagnosis of SARS-CoV-2 by FDA under an Emergency Use Authorization (EUA).  This EUA will remain in effect (meaning this test can be used) for the duration of the COVID-19 declaration under Section 564(b)(1) of the Act, 21 U.S.C. section 360bbb-3(b)(1), unless the authorization is terminated or revoked sooner. Performed at [facility], [address] [place], [place] [id number]      Labs: BNP (last 3 results) Recent Labs    10/30/18 0605 11/09/18 0635 11/24/18 0634  BNP 263.0* 357.0* 281.0*   Basic Metabolic Panel: Recent Labs  Lab 11/24/18 0634 11/25/18 0505 11/26/18 0618  NA 138 142 139  K 3.4* 3.9 3.9  CL 104 101 100  CO2 24 28 29   GLUCOSE 108* 90 96  BUN 14 14 18   CREATININE 1.05 1.11 1.23  CALCIUM 8.8* 8.8* 9.0  MG  --  2.1 2.1   Liver Function Tests: No results for input(s): AST, ALT, ALKPHOS, BILITOT, PROT, ALBUMIN in the last 168 hours. No results for input(s): LIPASE, AMYLASE in the last 168 hours. No results for input(s): AMMONIA in the last 168 hours. CBC: Recent Labs  Lab 11/24/18 0634 11/25/18 0505  WBC 4.3 4.6  NEUTROABS 2.9  --   HGB 13.1 13.5  HCT 40.7 43.6  MCV 87.9 89.2  PLT 181 187   Cardiac Enzymes: Recent Labs  Lab 11/24/18 0634  TROPONINI <0.03   BNP: Invalid input(s): POCBNP CBG: No results for input(s): GLUCAP in the last 168 hours. D-Dimer No results for input(s): DDIMER in the last 72 hours. Hgb A1c No results  for input(s): HGBA1C  in the last 72 hours. Lipid Profile No results for input(s): CHOL, HDL, LDLCALC, TRIG, CHOLHDL, LDLDIRECT in the last 72 hours. Thyroid function studies No results for input(s): TSH, T4TOTAL, T3FREE, THYROIDAB in the last 72 hours.  Invalid input(s): FREET3 Anemia work up No results for input(s): VITAMINB12, FOLATE, FERRITIN, TIBC, IRON, RETICCTPCT in the last 72 hours. Urinalysis    Component Value Date/Time   COLORURINE YELLOW 04/01/2018 0332   APPEARANCEUR CLEAR 04/01/2018 0332   LABSPEC 1.008 04/01/2018 0332   PHURINE 7.0 04/01/2018 0332   GLUCOSEU NEGATIVE 04/01/2018 0332   GLUCOSEU NEG mg/dL 01/24/2010 2254   HGBUR NEGATIVE 04/01/2018 0332   BILIRUBINUR NEGATIVE 04/01/2018 0332   KETONESUR NEGATIVE 04/01/2018 0332   PROTEINUR NEGATIVE 04/01/2018 0332   UROBILINOGEN 0.2 08/08/2014 1445   NITRITE NEGATIVE 04/01/2018 0332   LEUKOCYTESUR NEGATIVE 04/01/2018 0332   Sepsis Labs Invalid input(s): PROCALCITONIN,  WBC,  LACTICIDVEN Microbiology Recent Results (from the past 240 hour(s))  SARS Coronavirus 2 (CEPHEID- Performed in [facility] hospital lab), Hosp Order     Status: None   Collection Time: 11/24/18  7:17 AM  Result Value Ref Range Status   SARS Coronavirus 2 NEGATIVE NEGATIVE Final    Comment: (NOTE) If result is NEGATIVE SARS-CoV-2 target nucleic acids are NOT DETECTED. The SARS-CoV-2 RNA is generally detectable in upper and lower  respiratory specimens during the acute phase of infection. The lowest  concentration of SARS-CoV-2 viral copies this assay can detect is 250  copies / mL. A negative result does not preclude SARS-CoV-2 infection  and should not be used as the sole basis for treatment or other  patient management decisions.  A negative result may occur with  improper specimen collection / handling, submission of specimen other  than nasopharyngeal swab, presence of viral mutation(s) within the  areas targeted by this assay, and  inadequate number of viral copies  (<250 copies / mL). A negative result must be combined with clinical  observations, patient history, and epidemiological information. If result is POSITIVE SARS-CoV-2 target nucleic acids are DETECTED. The SARS-CoV-2 RNA is generally detectable in upper and lower  respiratory specimens dur ing the acute phase of infection.  Positive  results are indicative of active infection with SARS-CoV-2.  Clinical  correlation with patient history and other diagnostic information is  necessary to determine patient infection status.  Positive results do  not rule out bacterial infection or co-infection with other viruses. If result is PRESUMPTIVE POSTIVE SARS-CoV-2 nucleic acids MAY BE PRESENT.   A presumptive positive result was obtained on the submitted specimen  and confirmed on repeat testing.  While 2019 novel coronavirus  (SARS-CoV-2) nucleic acids may be present in the submitted sample  additional confirmatory testing may be necessary for epidemiological  and / or clinical management purposes  to differentiate between  SARS-CoV-2 and other Sarbecovirus currently known to infect humans.  If clinically indicated additional testing with an alternate test  methodology [phone]) is advised. The SARS-CoV-2 RNA is generally  detectable in upper and lower respiratory sp ecimens during the acute  phase of infection. The expected result is Negative. Fact Sheet for Patients:  StrictlyIdeas.no Fact Sheet for Healthcare Providers: BankingDealers.co.za This test is not yet approved or cleared by the [place] FDA and has been authorized for detection and/or diagnosis of SARS-CoV-2 by FDA under an Emergency Use Authorization (EUA).  This EUA will remain in effect (meaning this test can be used) for the duration of the COVID-19 declaration under  Section 564(b)(1) of the Act, 21 U.S.C. section 360bbb-3(b)(1), unless the  authorization is terminated or revoked sooner. Performed at [facility], [address] [place], [place] [address]      Time coordinating discharge: 35 minutes  SIGNED:   Erick Blinks, DO [facility] 11/26/2018, 8:17 AM  If 7PM-7AM, please contact night-coverage [email or web address] Password [email or web address]

## 2018-11-26 NOTE — Progress Notes (Signed)
Removed IV-clean, dry, intact. Reviewed d/c paperwork with patient. Answered all questions. Kayla wheeled stable patient to short stay entrance where he was picked up by his friend.

## 2018-11-26 NOTE — ED Notes (Signed)
Awaiting DC instruct 

## 2018-11-26 NOTE — ED Notes (Signed)
Pt reports he cannot wrap his leg

## 2018-11-26 NOTE — ED Notes (Signed)
Pt reports that he was discharged this morning   Came here tonight because his r leg was burning and to get his leg rewrapped because he comes here for his leg to be wrapped  Reports he lives with his mother and 2 children and when asked if they could not help wrap his legs, replies maybe

## 2018-11-26 NOTE — ED Notes (Signed)
JI in to re assess 

## 2018-11-26 NOTE — ED Notes (Signed)
Pt discharged today from [facility]  Chronic swelling to R leg with weeping Home health has been arranged by SW

## 2018-11-26 NOTE — TOC Transition Note (Signed)
Transition of Care [facility]) - CM/SW Discharge Note   Patient Details  Name: [name] MRN: [id number] Date of Birth: May 17, 1966  [facility] CM/SW Contact:  Latanya Maudlin, RN Phone Number: 11/26/2018, 8:35 AM   Clinical Narrative:  Patient to be discharged per MD order. Orders in place for home health services. Previous TOC member had set up home health via [facility]. Notified [place] from [facility] of pending discharge. No DME needs.      Final next level of care: Home w Home Health Services Barriers to Discharge: No Barriers Identified   Patient Goals and CMS Choice   CMS Medicare.gov Compare Post Acute Care list provided to:: Patient Choice offered to / list presented to : Patient  Discharge Placement                       Discharge Plan and Services                          HH Arranged: RN [facility] Agency: [facility] Date [place]: 11/26/18 Time [place]: [phone] Representative spoke with at [place]: [place]) Interventions     Readmission Risk Interventions Readmission Risk Prevention Plan 11/26/2018 11/25/2018 11/11/2018  Transportation Screening Complete Complete Complete  Medication Review Press photographer) Complete Complete Complete  PCP or Specialist appointment within 3-5 days of discharge Not Complete Not Complete -  [place] or Home Care Consult Complete Complete Not Complete  HRI or Home Care Consult Pt Refusal Comments - - not ordered, no [facility] will accept pt any longer  SW Recovery Care/Counseling Consult Complete Complete Complete  Palliative Care Screening Not Applicable Not Applicable Not [place] Not Applicable Not Applicable Not Applicable  Some recent data might be hidden

## 2018-11-26 NOTE — ED Notes (Signed)
Pt reports that he has adult children who are close by   He is encouraged to ask home health to assist in teaching children to apply dressings to his legs  Pt toenails are long and beginning to curl back He reports he usually cuts his nails but has not in awhile

## 2018-11-27 NOTE — ED Provider Notes (Signed)
[facility] EMERGENCY DEPARTMENT Provider Note   CSN: [id number] Arrival date & time: 11/26/18  2015    History   Chief Complaint Chief Complaint  Patient presents with  . Leg Swelling    HPI [name] is a 53 y.o. male with a history of CHF with frequent admissions due to medication and diet noncompliance, atrial fibrillation, HTN, asthma and chronic persistent lower extremity skin changes right>left secondary to lymphedema, was discharged this am during which time he was actively diuresed of 13.2 L of fluid.  He returns here secondary to pain along his right medial and posterior ankle where he has chronic inflammation secondary to skin changes which started draining today.  He describes a burning sensation which is sometimes improved with application of vaseline or lotion.  He has had no treatment of this prior to arrival.      The history is provided by the patient.    Past Medical History:  Diagnosis Date  . Allergic rhinitis   . Asthma   . Atrial fibrillation ([place])   . CHF (congestive heart failure) ([place])   . Elevated troponin level not due myocardial infarction 06/13/2015   cath w/ nl cors, felt 2nd HTN, CHF, peat trop 2.73  . Essential hypertension   . Gastroesophageal reflux disease   . History of cardiac catheterization    Normal coronaries December 2016  . Noncompliance    Major problem leading to declining health and recurrent hospitalization  . Nonischemic cardiomyopathy ([place])    a. LVEF 20-25% in 2017 with cath in 2016 showing normal cors. b. EF 55% by echo in 03/2018.  . On home O2    3L N/C  . OSA (obstructive sleep apnea)   . Osteoarthritis   . Peptic ulcer disease     Patient Active Problem List   Diagnosis Date Noted  . Chronic obstructive pulmonary disease ([place])   . Benign prostatic hyperplasia without lower urinary tract symptoms   . Acute on chronic combined systolic and diastolic CHF (congestive heart failure) ([place]) 10/02/2018  . AKI (acute  kidney injury) ([place]) 09/18/2018  . Acute on chronic combined systolic and diastolic congestive heart failure ([place]) 08/15/2018  . SOB (shortness of breath) 08/03/2018  . Volume overload 07/22/2018  . Acute systolic CHF (congestive heart failure) ([place]) 07/04/2018  . Acute on chronic combined systolic and diastolic heart failure ([place]) 04/01/2018  . Hypomagnesemia 04/01/2018  . Acute on chronic diastolic CHF (congestive heart failure) ([place]) 03/11/2018  . Pressure injury of skin 03/11/2018  . CHF (congestive heart failure) ([place]) 09/08/2017  . Altered mental status 05/11/2017  . Acute exacerbation of CHF (congestive heart failure) ([place]) 04/20/2017  . Acute on chronic systolic (congestive) heart failure ([place]) 04/19/2017  . CHF exacerbation ([place]) 04/11/2017  . Chronic respiratory failure with hypoxia ([place]) 04/11/2017  . Encounter for hospice care discussion   . Palliative care encounter   . Goals of care, counseling/discussion   . DNR (do not resuscitate)   . DNR (do not resuscitate) discussion   . Chronic congestive heart failure ([place]) 02/18/2017  . Acute systolic heart failure ([place]) 01/18/2017  . Pedal edema 12/02/2016  . Acute CHF (congestive heart failure) ([place]) 11/09/2016  . Acute on chronic systolic CHF (congestive heart failure) ([place]) 08/19/2016  . CKD (chronic kidney disease), stage II 05/17/2016  . Coronary arteries, normal Dec 2016 01/23/2016  . Chronic anticoagulation-Xarelto 01/23/2016  . NSVT (nonsustained ventricular tachycardia) ([place]) 01/23/2016  . Elevated troponin 12/21/2015  . Nonischemic  cardiomyopathy (HCC)   . Morbid obesity due to excess calories (HCC)   . Noncompliance with diet and medication regimen 12/02/2015  . OSA (obstructive sleep apnea) 09/20/2015  . Pulmonary hypertension (HCC) 09/19/2015  . Normocytic anemia 09/19/2015  . Atrial fibrillation, chronic   . Dyspnea on exertion 05/28/2015  . Acute respiratory failure with hypoxia (HCC) 04/01/2015  .  Hypokalemia 04/01/2015  . Obesity hypoventilation syndrome (HCC) 08/08/2014  . GERD (gastroesophageal reflux disease) 08/08/2014  . Edema of right lower extremity 06/21/2014  . Fecal urgency 06/21/2014  . Asthma 02/11/2009  . Hyperlipidemia 07/12/2008  . OBESITY, MORBID 07/12/2008  . Anxiety state 07/12/2008  . DEPRESSION 07/12/2008  . Essential hypertension 07/12/2008  . ALLERGIC RHINITIS 07/12/2008  . Peptic ulcer 07/12/2008  . OSTEOARTHRITIS 07/12/2008    Past Surgical History:  Procedure Laterality Date  . CARDIAC CATHETERIZATION N/A 06/14/2015   Procedure: Right/Left Heart Cath and Coronary Angiography;  Surgeon: Runell Gess, MD;  Location: [facility] INVASIVE CV LAB;  Service: Cardiovascular;  Laterality: N/A;        Home Medications    Prior to Admission medications   Medication Sig Start Date End Date Taking? Authorizing Provider  acetaminophen (TYLENOL) 325 MG tablet Take 2 tablets (650 mg total) by mouth every 4 (four) hours as needed for headache or mild pain. 11/02/18  Yes Calvert Cantor, MD  albuterol (PROVENTIL) (2.5 MG/3ML) 0.083% nebulizer solution Take 2.5 mg by nebulization every 6 (six) hours as needed for wheezing or shortness of breath.   Yes [provider]  atorvastatin (LIPITOR) 40 MG tablet Take 40 mg by mouth daily.  09/05/18  Yes [provider]  bumetanide (BUMEX) 2 MG tablet Take 1 tablet (2 mg total) by mouth 3 (three) times daily for 30 days. 11/12/18 12/12/18 Yes Shah, Pratik D, DO  carvedilol (COREG) 25 MG tablet Take 1 tablet (25 mg total) by mouth 2 (two) times daily with a meal for 30 days. 08/18/18 11/04/19 Yes Shah, Pratik D, DO  docusate sodium (COLACE) 100 MG capsule Take 1 capsule (100 mg total) by mouth 2 (two) times daily. Patient taking differently: Take 100 mg by mouth every morning.  06/25/16  Yes Kathlen Mody, MD  ferrous gluconate (FERGON) 324 MG tablet Take 1 tablet (324 mg total) by mouth 2 (two) times daily with a meal.  Patient taking differently: Take 324 mg by mouth daily.  06/25/16  Yes Kathlen Mody, MD  fluticasone (FLOVENT HFA) 220 MCG/ACT inhaler Inhale 2 puffs into the lungs daily as needed (for shortness of breath).    Yes [provider]  lisinopril (ZESTRIL) 10 MG tablet Take 10 mg by mouth daily. 11/22/18  Yes [provider]  OXYGEN Inhale 3.5 L into the lungs continuous.    Yes [provider]  pantoprazole (PROTONIX) 40 MG tablet Take 1 tablet (40 mg total) by mouth daily at 6 (six) AM. 06/26/16  Yes Kathlen Mody, MD  potassium chloride SA (K-DUR) 20 MEQ tablet Take 2 tablets (40 mEq total) by mouth 2 (two) times daily. 10/07/18  Yes Vassie Loll, MD  rivaroxaban (XARELTO) 20 MG TABS tablet Take 1 tablet (20 mg total) by mouth daily with breakfast. Patient taking differently: Take 20 mg by mouth daily with supper.  06/26/16  Yes Kathlen Mody, MD  spironolactone (ALDACTONE) 100 MG tablet Take 100 mg by mouth daily. For fluid 03/18/17  Yes [provider]  tamsulosin (FLOMAX) 0.4 MG CAPS capsule Take 1 capsule (0.4 mg total)  by mouth daily. 06/25/16  Yes Kathlen ModyAkula, Vijaya, MD  silver sulfADIAZINE (SILVADENE) 1 % cream Apply 1 application topically daily. 11/26/18   Burgess AmorIdol, Danyah Guastella, PA-C  potassium chloride 20 MEQ TBCR Take 20 mEq by mouth 2 (two) times daily. 09/03/15 11/04/18  Ward, Layla MawKristen N, DO    Family History Family History  Problem Relation Age of Onset  . Stroke Father   . Heart attack Father   . Aneurysm Mother        Cerebral aneurysm  . Hypertension Sister   . Colon cancer Neg Hx   . Inflammatory bowel disease Neg Hx   . Liver disease Neg Hx     Social History Social History   Tobacco Use  . Smoking status: Former Smoker    Packs/day: 0.50    Years: 20.00    Pack years: 10.00    Types: Cigarettes    Start date: 04/26/1988    Last attempt to quit: 06/23/2007    Years since quitting: 11.4  . Smokeless tobacco: Never Used  . Tobacco comment: 1 ppd former  smoker  Substance Use Topics  . Alcohol use: Not Currently    Alcohol/week: 0.0 standard drinks    Comment: No etoh since 2009  . Drug use: No     Allergies   Banana; Bee venom; Strawberry flavor; Aspirin; Metolazone; Oatmeal; Orange juice [orange oil]; Torsemide; Diltiazem; Hydralazine; and Lipitor [atorvastatin]   Review of Systems Review of Systems  Constitutional: Negative for fever.  HENT: Negative for congestion and sore throat.   Eyes: Negative.   Respiratory: Positive for shortness of breath. Negative for chest tightness.        Sob chronic finding, not worsened this evening.  Cardiovascular: Negative for chest pain.  Gastrointestinal: Negative for abdominal pain and nausea.  Genitourinary: Negative.   Musculoskeletal: Positive for arthralgias. Negative for joint swelling and neck pain.  Skin: Positive for color change and wound. Negative for rash.  Neurological: Negative for dizziness, weakness, light-headedness, numbness and headaches.  Psychiatric/Behavioral: Negative.      Physical Exam Updated Vital Signs BP 119/83   Pulse (!) 49   Temp 98 F (36.7 C) (Oral)   Resp (!) 23   Ht 6' (1.829 m)   Wt (!) 163.2 kg   SpO2 99%   BMI 48.80 kg/m   Physical Exam Constitutional:      General: He is not in acute distress.    Appearance: He is well-developed.  HENT:     Head: Normocephalic.  Neck:     Musculoskeletal: Neck supple.  Cardiovascular:     Rate and Rhythm: Normal rate.  Pulmonary:     Effort: Pulmonary effort is normal.     Breath sounds: No wheezing.  Musculoskeletal: Normal range of motion.  Skin:    Findings: Rash present.     Comments: Bilateral lower extremity edema with chronic skin changes, right leg much>left.  Thick hyperkeratosis of  foot, ankle and calf with an abraded appearing location medial ankle and posterior heel. No open wounds or ulcers. No increased warmth.  Dorsalis pedal pulse present.        ED Treatments / Results   Labs (all labs ordered are listed, but only abnormal results are displayed) Labs Reviewed - No data to display  EKG None  Radiology No results found.  Procedures Procedures (including critical care time)  Medications Ordered in ED Medications  silver sulfADIAZINE (SILVADENE) 1 % cream (1 application Topical Given 11/26/18 2057)  Initial Impression / Assessment and Plan / ED Course  I have reviewed the triage vital signs and the nursing notes.  Pertinent labs & imaging results that were available during my care of the patient were reviewed by me and considered in my medical decision making (see chart for details).        Pt with chronic skin changes associated with severe chronic lymphedema right>left.  We applied silvadene cream to the abraded burning sections with improvement in sx. Dressing applied. He was encouraged to discuss this sx with his pcp (has appt this week).  He may benefit from wound care consultation/ ? Dermatology to help with this chronic condition.     Final Clinical Impressions(s) / ED Diagnoses   Final diagnoses:  Lymphedema of right lower extremity    ED Discharge Orders         Ordered    silver sulfADIAZINE (SILVADENE) 1 % cream  Daily     11/26/18 2146           Burgess Amordol, Shawnta Schlegel, PA-C 11/27/18 1255    Bethann BerkshireZammit, Joseph, MD 11/27/18 2004

## 2018-11-28 ENCOUNTER — Emergency Department (HOSPITAL_COMMUNITY)
Admission: EM | Admit: 2018-11-28 | Discharge: 2018-11-28 | Disposition: A | Discharge status: Home / Self Care | Payer: Medicare Other | Attending: Emergency Medicine | Admitting: Emergency Medicine

## 2018-11-28 ENCOUNTER — Ambulatory Visit: Payer: Self-pay

## 2018-11-28 ENCOUNTER — Other Ambulatory Visit: Payer: Self-pay

## 2018-11-28 ENCOUNTER — Ambulatory Visit: Payer: Self-pay | Admitting: Pharmacist

## 2018-11-28 ENCOUNTER — Other Ambulatory Visit: Payer: Self-pay | Admitting: Pharmacist

## 2018-11-28 ENCOUNTER — Encounter (HOSPITAL_COMMUNITY): Payer: Self-pay | Admitting: Emergency Medicine

## 2018-11-28 DIAGNOSIS — Z79899 Other long term (current) drug therapy: Secondary | ICD-10-CM | POA: Diagnosis not present

## 2018-11-28 DIAGNOSIS — I890 Lymphedema, not elsewhere classified: Secondary | ICD-10-CM | POA: Insufficient documentation

## 2018-11-28 DIAGNOSIS — Z7901 Long term (current) use of anticoagulants: Secondary | ICD-10-CM | POA: Diagnosis not present

## 2018-11-28 DIAGNOSIS — N182 Chronic kidney disease, stage 2 (mild): Secondary | ICD-10-CM | POA: Insufficient documentation

## 2018-11-28 DIAGNOSIS — I130 Hypertensive heart and chronic kidney disease with heart failure and stage 1 through stage 4 chronic kidney disease, or unspecified chronic kidney disease: Secondary | ICD-10-CM | POA: Insufficient documentation

## 2018-11-28 DIAGNOSIS — Z87891 Personal history of nicotine dependence: Secondary | ICD-10-CM | POA: Diagnosis not present

## 2018-11-28 DIAGNOSIS — I5043 Acute on chronic combined systolic (congestive) and diastolic (congestive) heart failure: Secondary | ICD-10-CM | POA: Diagnosis not present

## 2018-11-28 DIAGNOSIS — M25571 Pain in right ankle and joints of right foot: Secondary | ICD-10-CM | POA: Diagnosis not present

## 2018-11-28 DIAGNOSIS — J45909 Unspecified asthma, uncomplicated: Secondary | ICD-10-CM | POA: Insufficient documentation

## 2018-11-28 DIAGNOSIS — R52 Pain, unspecified: Secondary | ICD-10-CM | POA: Diagnosis not present

## 2018-11-28 MED ORDER — SILVER SULFADIAZINE 1 % EX CREA: 1.0000 "application " | TOPICAL_CREAM | Freq: Every day | CUTANEOUS | 0 refills | Status: DC

## 2018-11-28 NOTE — ED Triage Notes (Signed)
Pt C/O right lower leg weeping wound. Pt stating he needs the wound rewrapped and something for pain. Pt here for same on Saturday 11/26/2018.

## 2018-11-28 NOTE — ED Provider Notes (Signed)
[facility] EMERGENCY DEPARTMENT Provider Note   CSN: [id number] Arrival date & time: 11/28/18  1459    History   Chief Complaint Chief Complaint  Patient presents with  . Leg Pain    HPI [name] is a 53 y.o. male past with history of CHF, A. fib, asthma, hypertension, chronic persistent lower extremity skin changes, right greater than left secondary to lymphedema who presents today for evaluation of wound check to right lower extremity.  Patient was seen in the ED on 11/26/2018 for evaluation of same symptoms.  At that time, he had wound care and legs were wrapped and protective dressing.  He states that he had to have the dressing removed and noted that it was draining at certain points.  He states that sometimes he applies Vaseline to the legs which has helped.  He was also prescribed Silvadene which he states he has been using.  He states he came to get the legs rewrapped.  He states that they have not had any overlying warmth, erythema.  He has not noted any fevers.  Denies any numbness/weakness.  Patient denies any chest pain, difficulty breathing.  He has an appointment with his primary care doctor later this week.     The history is provided by the patient.    Past Medical History:  Diagnosis Date  . Allergic rhinitis   . Asthma   . Atrial fibrillation (HCC)   . CHF (congestive heart failure) (HCC)   . Elevated troponin level not due myocardial infarction 06/13/2015   cath w/ nl cors, felt 2nd HTN, CHF, peat trop 2.73  . Essential hypertension   . Gastroesophageal reflux disease   . History of cardiac catheterization    Normal coronaries December 2016  . Noncompliance    Major problem leading to declining health and recurrent hospitalization  . Nonischemic cardiomyopathy (HCC)    a. LVEF 20-25% in 2017 with cath in 2016 showing normal cors. b. EF 55% by echo in 03/2018.  . On home O2    3L N/C  . OSA (obstructive sleep apnea)   . Osteoarthritis   . Peptic ulcer  disease     Patient Active Problem List   Diagnosis Date Noted  . Chronic obstructive pulmonary disease (HCC)   . Benign prostatic hyperplasia without lower urinary tract symptoms   . Acute on chronic combined systolic and diastolic CHF (congestive heart failure) (HCC) 10/02/2018  . AKI (acute kidney injury) (HCC) 09/18/2018  . Acute on chronic combined systolic and diastolic congestive heart failure (HCC) 08/15/2018  . SOB (shortness of breath) 08/03/2018  . Volume overload 07/22/2018  . Acute systolic CHF (congestive heart failure) (HCC) 07/04/2018  . Acute on chronic combined systolic and diastolic heart failure (HCC) 04/01/2018  . Hypomagnesemia 04/01/2018  . Acute on chronic diastolic CHF (congestive heart failure) (HCC) 03/11/2018  . Pressure injury of skin 03/11/2018  . CHF (congestive heart failure) (HCC) 09/08/2017  . Altered mental status 05/11/2017  . Acute exacerbation of CHF (congestive heart failure) (HCC) 04/20/2017  . Acute on chronic systolic (congestive) heart failure (HCC) 04/19/2017  . CHF exacerbation (HCC) 04/11/2017  . Chronic respiratory failure with hypoxia (HCC) 04/11/2017  . Encounter for hospice care discussion   . Palliative care encounter   . Goals of care, counseling/discussion   . DNR (do not resuscitate)   . DNR (do not resuscitate) discussion   . Chronic congestive heart failure (HCC) 02/18/2017  . Acute systolic heart failure (HCC) 01/18/2017  .  Pedal edema 12/02/2016  . Acute CHF (congestive heart failure) ([place]) 11/09/2016  . Acute on chronic systolic CHF (congestive heart failure) ([place]) 08/19/2016  . CKD (chronic kidney disease), stage II 05/17/2016  . Coronary arteries, normal Dec 2016 01/23/2016  . Chronic anticoagulation-Xarelto 01/23/2016  . NSVT (nonsustained ventricular tachycardia) ([place]) 01/23/2016  . Elevated troponin 12/21/2015  . Nonischemic cardiomyopathy ([place])   . Morbid obesity due to excess calories ([place])   . Noncompliance  with diet and medication regimen 12/02/2015  . OSA (obstructive sleep apnea) 09/20/2015  . Pulmonary hypertension ([place]) 09/19/2015  . Normocytic anemia 09/19/2015  . Atrial fibrillation, chronic   . Dyspnea on exertion 05/28/2015  . Acute respiratory failure with hypoxia ([place]) 04/01/2015  . Hypokalemia 04/01/2015  . Obesity hypoventilation syndrome ([place]) 08/08/2014  . GERD (gastroesophageal reflux disease) 08/08/2014  . Edema of right lower extremity 06/21/2014  . Fecal urgency 06/21/2014  . Asthma 02/11/2009  . Hyperlipidemia 07/12/2008  . OBESITY, MORBID 07/12/2008  . Anxiety state 07/12/2008  . DEPRESSION 07/12/2008  . Essential hypertension 07/12/2008  . ALLERGIC RHINITIS 07/12/2008  . Peptic ulcer 07/12/2008  . OSTEOARTHRITIS 07/12/2008    Past Surgical History:  Procedure Laterality Date  . CARDIAC CATHETERIZATION N/A 06/14/2015   Procedure: Right/Left Heart Cath and Coronary Angiography;  Surgeon: Lorretta Harp, MD;  Location: [facility] CV LAB;  Service: Cardiovascular;  Laterality: N/A;        Home Medications    Prior to Admission medications   Medication Sig Start Date End Date Taking? Authorizing Provider  acetaminophen (TYLENOL) 325 MG tablet Take 2 tablets (650 mg total) by mouth every 4 (four) hours as needed for headache or mild pain. 11/02/18   Debbe Odea, MD  albuterol (PROVENTIL) (2.5 MG/3ML) 0.083% nebulizer solution Take 2.5 mg by nebulization every 6 (six) hours as needed for wheezing or shortness of breath.    [provider]  atorvastatin (LIPITOR) 40 MG tablet Take 40 mg by mouth daily.  09/05/18   [provider]  bumetanide (BUMEX) 2 MG tablet Take 1 tablet (2 mg total) by mouth 3 (three) times daily for 30 days. 11/12/18 12/12/18  Manuella Ghazi, Pratik D, DO  carvedilol (COREG) 25 MG tablet Take 1 tablet (25 mg total) by mouth 2 (two) times daily with a meal for 30 days. 08/18/18 11/04/19  Manuella Ghazi, Pratik D, DO  docusate sodium (COLACE) 100  MG capsule Take 1 capsule (100 mg total) by mouth 2 (two) times daily. Patient taking differently: Take 100 mg by mouth every morning.  06/25/16   Hosie Poisson, MD  ferrous gluconate (FERGON) 324 MG tablet Take 1 tablet (324 mg total) by mouth 2 (two) times daily with a meal. Patient taking differently: Take 324 mg by mouth daily.  06/25/16   Hosie Poisson, MD  fluticasone (FLOVENT HFA) 220 MCG/ACT inhaler Inhale 2 puffs into the lungs daily as needed (for shortness of breath).     [provider]  lisinopril (ZESTRIL) 10 MG tablet Take 10 mg by mouth daily. 11/22/18   [provider]  OXYGEN Inhale 3.5 L into the lungs continuous.     [provider]  pantoprazole (PROTONIX) 40 MG tablet Take 1 tablet (40 mg total) by mouth daily at 6 (six) AM. 06/26/16   Hosie Poisson, MD  potassium chloride SA (K-DUR) 20 MEQ tablet Take 2 tablets (40 mEq total) by mouth 2 (two) times daily. 10/07/18   Barton Dubois, MD  rivaroxaban (XARELTO) 20 MG TABS tablet  Take 1 tablet (20 mg total) by mouth daily with breakfast. Patient taking differently: Take 20 mg by mouth daily with supper.  06/26/16   Kathlen ModyAkula, Vijaya, MD  silver sulfADIAZINE (SILVADENE) 1 % cream Apply 1 application topically daily. 11/28/18   Maxwell CaulLayden, Lindsey A, PA-C  spironolactone (ALDACTONE) 100 MG tablet Take 100 mg by mouth daily. For fluid 03/18/17   [provider]  tamsulosin (FLOMAX) 0.4 MG CAPS capsule Take 1 capsule (0.4 mg total) by mouth daily. 06/25/16   Kathlen ModyAkula, Vijaya, MD  potassium chloride 20 MEQ TBCR Take 20 mEq by mouth 2 (two) times daily. 09/03/15 11/04/18  Ward, Layla MawKristen N, DO    Family History Family History  Problem Relation Age of Onset  . Stroke Father   . Heart attack Father   . Aneurysm Mother        Cerebral aneurysm  . Hypertension Sister   . Colon cancer Neg Hx   . Inflammatory bowel disease Neg Hx   . Liver disease Neg Hx     Social History Social History   Tobacco Use  . Smoking status:  Former Smoker    Packs/day: 0.50    Years: 20.00    Pack years: 10.00    Types: Cigarettes    Start date: 04/26/1988    Last attempt to quit: 06/23/2007    Years since quitting: 11.4  . Smokeless tobacco: Never Used  . Tobacco comment: 1 ppd former smoker  Substance Use Topics  . Alcohol use: Not Currently    Alcohol/week: 0.0 standard drinks    Comment: No etoh since 2009  . Drug use: No     Allergies   Banana; Bee venom; Strawberry flavor; Aspirin; Metolazone; Oatmeal; Orange juice [orange oil]; Torsemide; Diltiazem; Hydralazine; and Lipitor [atorvastatin]   Review of Systems Review of Systems  Constitutional: Negative for fever.  Respiratory: Negative for shortness of breath.   Cardiovascular: Negative for chest pain.  Gastrointestinal: Negative for abdominal pain.  Skin: Positive for wound. Negative for color change.  Neurological: Negative for weakness and numbness.  All other systems reviewed and are negative.    Physical Exam Updated Vital Signs BP (!) 154/112 (BP Location: Right Wrist)   Pulse 76   Temp 98.7 F (37.1 C) (Oral)   Resp 18   SpO2 100%   Physical Exam Vitals signs and nursing note reviewed.  Constitutional:      Appearance: He is well-developed.  HENT:     Head: Normocephalic and atraumatic.  Eyes:     General: No scleral icterus.       Right eye: No discharge.        Left eye: No discharge.     Conjunctiva/sclera: Conjunctivae normal.  Cardiovascular:     Pulses:          Dorsalis pedis pulses are 2+ on the right side and 2+ on the left side.  Pulmonary:     Effort: Pulmonary effort is normal.     Comments: Lungs clear to auscultation bilaterally.  Symmetric chest rise.  No wheezing, rales, rhonchi. Skin:    General: Skin is warm and dry.     Capillary Refill: Capillary refill takes less than 2 seconds.     Comments: Good distal cap refill. RLE is not dusky in appearance or cool to touch.  Chronic skin changes noted to bilateral lower  extremities with surrounding edema.  Right slightly greater than left.  He has thick hyperkeratosis surrounding the foot, ankle and tib-fib area.  No evidence of open wounds, ulcers, abrasions.  No active drainage.  No overlying warmth, erythema.  Neurological:     Mental Status: He is alert.  Psychiatric:        Speech: Speech normal.        Behavior: Behavior normal.     ED Treatments / Results  Labs (all labs ordered are listed, but only abnormal results are displayed) Labs Reviewed - No data to display  EKG None  Radiology No results found.  Procedures Procedures (including critical care time)  Medications Ordered in ED Medications - No data to display   Initial Impression / Assessment and Plan / ED Course  I have reviewed the triage vital signs and the nursing notes.  Pertinent labs & imaging results that were available during my care of the patient were reviewed by me and considered in my medical decision making (see chart for details).        53 year old male who presents for evaluation of wound check.  Seen here 2 days ago for evaluation of chronic leg edema with some skin changes.  Prescribed Silvadene which he states he has been using but ran out.  Comes today because he has a legs wrapped and states he had removed the wrapping would like them rewrapped.  He has an appointment with his primary care doctor later this week.  No fevers, overlying warmth, erythema.  No chest pain, difficulty breathing. Patient is afebrile, non-toxic appearing, sitting comfortably on examination table. Vital signs reviewed and stable.  Lateral lower extremities have chronic skin changes including edema and hyperkeratosis of right lower extremity.  No overlying warmth, erythema.  History/physical exam not concerning for DVT, acute arterial embolism.  Plan for wound care, repeat dressing.  Will give additional prescription for Silvadene.  Encourage follow-up with primary care doctor. At this  time, patient exhibits no emergent life-threatening condition that require further evaluation in ED or admission. Patient had ample opportunity for questions and discussion. All patient's questions were answered with full understanding.   Portions of this note were generated with Scientist, clinical (histocompatibility and immunogenetics)Dragon dictation software. Dictation errors may occur despite best attempts at proofreading.   Final Clinical Impressions(s) / ED Diagnoses   Final diagnoses:  Lymphedema of right lower extremity    ED Discharge Orders         Ordered    silver sulfADIAZINE (SILVADENE) 1 % cream  Daily     11/28/18 2003           Rosana HoesLayden, Lindsey A, PA-C 11/28/18 2057    Jacalyn LefevreHaviland, Julie, MD 11/28/18 2059

## 2018-11-28 NOTE — Patient Outreach (Signed)
[facility]) Care Management  11/28/2018  ALEN MATHESON Mar 22, 1966 [id number]   Reviewed patient's chart prior to calling him and discovered that he went to the ED today for leg pain.  Patient was hospitalized 11/24/2018 for CHF exacerbation and visited the ED 11/26/2018 for lymphedema of right lower extremity.  Plan: Call patient after discharge.  Elayne Guerin, PharmD, [place] Clinical Pharmacist [phone]

## 2018-11-28 NOTE — Discharge Instructions (Signed)
Keep wounds clean and dry.  Apply Silvadene as directed.  Follow-up with your primary care doctor.  Return to emergency department for any worsening pain, redness or swelling, fevers or any other worsening or concerning symptoms.

## 2018-11-29 ENCOUNTER — Other Ambulatory Visit: Payer: Self-pay

## 2018-11-29 ENCOUNTER — Other Ambulatory Visit: Payer: Self-pay | Admitting: Pharmacist

## 2018-11-29 NOTE — Patient Outreach (Signed)
[facility]) Care Management  11/29/2018  [name] Apr 14, 1966 [id number]   Patient was called to follow up after his ED visit yesterday. Unfortunately, he did not answer the phone. His phone rang >30 times without an answer or voice mail pick up. A HIPAA compliant message could not be left.  Plan: Call patient back in 1 week. From chart review, patient spoke with [place] Nurse, Donald Siva today.   Elayne Guerin, PharmD, [place] Clinical Pharmacist [phone]

## 2018-11-29 NOTE — Patient Outreach (Signed)
[facility]) Care Management  11/29/2018  [name] 12/21/1965 [id number]    [name] was readmitted on June 4th for CHF exacerbation and discharged on June 8th. He returned to the ED that evening due to drainage from his right lower extremity. Per chart review, referral was placed for home health nursing services.   Reports a weight of 371 lbs.  Today he reports feeling well. Denies complaints of shortness of breath. No complaints of chest discomfort of palpitations. Denies increased edema to his lower extremities but reports the right leg continues to drain. He is scheduled for a follow-up visit at [facility] on tomorrow.  PLAN -Will follow-up after his appointment tomorrow.  [place] [phone]

## 2018-11-30 ENCOUNTER — Encounter (HOSPITAL_COMMUNITY): Payer: Self-pay

## 2018-11-30 ENCOUNTER — Emergency Department (HOSPITAL_COMMUNITY)
Admission: EM | Admit: 2018-11-30 | Discharge: 2018-11-30 | Disposition: A | Discharge status: Home / Self Care | Payer: Medicare Other | Attending: Emergency Medicine | Admitting: Emergency Medicine

## 2018-11-30 ENCOUNTER — Ambulatory Visit: Payer: Self-pay | Admitting: Pharmacist

## 2018-11-30 ENCOUNTER — Other Ambulatory Visit: Payer: Self-pay

## 2018-11-30 DIAGNOSIS — I499 Cardiac arrhythmia, unspecified: Secondary | ICD-10-CM | POA: Diagnosis not present

## 2018-11-30 DIAGNOSIS — I429 Cardiomyopathy, unspecified: Secondary | ICD-10-CM | POA: Diagnosis not present

## 2018-11-30 DIAGNOSIS — I480 Paroxysmal atrial fibrillation: Secondary | ICD-10-CM | POA: Diagnosis not present

## 2018-11-30 DIAGNOSIS — N182 Chronic kidney disease, stage 2 (mild): Secondary | ICD-10-CM | POA: Insufficient documentation

## 2018-11-30 DIAGNOSIS — I130 Hypertensive heart and chronic kidney disease with heart failure and stage 1 through stage 4 chronic kidney disease, or unspecified chronic kidney disease: Secondary | ICD-10-CM | POA: Diagnosis not present

## 2018-11-30 DIAGNOSIS — J449 Chronic obstructive pulmonary disease, unspecified: Secondary | ICD-10-CM | POA: Insufficient documentation

## 2018-11-30 DIAGNOSIS — M79671 Pain in right foot: Secondary | ICD-10-CM | POA: Diagnosis not present

## 2018-11-30 DIAGNOSIS — I5042 Chronic combined systolic (congestive) and diastolic (congestive) heart failure: Secondary | ICD-10-CM | POA: Insufficient documentation

## 2018-11-30 DIAGNOSIS — I491 Atrial premature depolarization: Secondary | ICD-10-CM | POA: Diagnosis not present

## 2018-11-30 DIAGNOSIS — I890 Lymphedema, not elsewhere classified: Secondary | ICD-10-CM | POA: Diagnosis not present

## 2018-11-30 DIAGNOSIS — Z87891 Personal history of nicotine dependence: Secondary | ICD-10-CM | POA: Diagnosis not present

## 2018-11-30 DIAGNOSIS — I10 Essential (primary) hypertension: Secondary | ICD-10-CM | POA: Diagnosis not present

## 2018-11-30 DIAGNOSIS — Z79899 Other long term (current) drug therapy: Secondary | ICD-10-CM | POA: Insufficient documentation

## 2018-11-30 DIAGNOSIS — M79604 Pain in right leg: Secondary | ICD-10-CM | POA: Diagnosis not present

## 2018-11-30 DIAGNOSIS — Z7901 Long term (current) use of anticoagulants: Secondary | ICD-10-CM | POA: Diagnosis not present

## 2018-11-30 DIAGNOSIS — I4891 Unspecified atrial fibrillation: Secondary | ICD-10-CM | POA: Diagnosis not present

## 2018-11-30 DIAGNOSIS — M25571 Pain in right ankle and joints of right foot: Secondary | ICD-10-CM | POA: Diagnosis not present

## 2018-11-30 MED ORDER — OXYCODONE-ACETAMINOPHEN 5-325 MG PO TABS: 1.0000 | ORAL_TABLET | Freq: Four times a day (QID) | ORAL | 0 refills | Status: DC | PRN

## 2018-11-30 MED ORDER — OXYCODONE-ACETAMINOPHEN 5-325 MG PO TABS
1.0000 | ORAL_TABLET | Freq: Once | ORAL | Status: AC
  Administered 2018-11-30: 1 via ORAL
  Filled 2018-11-30: qty 1

## 2018-11-30 NOTE — Discharge Instructions (Addendum)
Follow up as needed with your md and get your pain medicine tomorrow

## 2018-11-30 NOTE — ED Triage Notes (Addendum)
Pt presents with right lower leg pain and bilateral lower extremity edema. Pt was seen at PCP for this issue today and sent home with pain medication. Pt states pain is worse now and he is unable to walk due to pain. Pt denies injury to leg. Negative Homan's sign per pt.

## 2018-11-30 NOTE — ED Provider Notes (Addendum)
[facility] EMERGENCY DEPARTMENT Provider Note   CSN: [id number] Arrival date & time: 11/30/18  2134    History   Chief Complaint Chief Complaint  Patient presents with  . Leg Swelling    HPI [name] is a 53 y.o. male.     Pt with right foot pain and he saw his md who gave him a prescription for pain meds.  The pharmacy will not have the medicine until tomorrow  The history is provided by the patient. No language interpreter was used.  Foot Pain  This is a new problem. The current episode started more than 2 days ago. The problem occurs constantly. The problem has not changed since onset.Pertinent negatives include no chest pain, no abdominal pain and no headaches. Nothing aggravates the symptoms. Nothing relieves the symptoms. He has tried nothing for the symptoms. The treatment provided no relief.    Past Medical History:  Diagnosis Date  . Allergic rhinitis   . Asthma   . Atrial fibrillation ([place])   . CHF (congestive heart failure) ([place])   . Elevated troponin level not due myocardial infarction 06/13/2015   cath w/ nl cors, felt 2nd HTN, CHF, peat trop 2.73  . Essential hypertension   . Gastroesophageal reflux disease   . History of cardiac catheterization    Normal coronaries December 2016  . Noncompliance    Major problem leading to declining health and recurrent hospitalization  . Nonischemic cardiomyopathy ([place])    a. LVEF 20-25% in 2017 with cath in 2016 showing normal cors. b. EF 55% by echo in 03/2018.  . On home O2    3L N/C  . OSA (obstructive sleep apnea)   . Osteoarthritis   . Peptic ulcer disease     Patient Active Problem List   Diagnosis Date Noted  . Chronic obstructive pulmonary disease ([place])   . Benign prostatic hyperplasia without lower urinary tract symptoms   . Acute on chronic combined systolic and diastolic CHF (congestive heart failure) ([place]) 10/02/2018  . AKI (acute kidney injury) ([place]) 09/18/2018  . Acute on chronic combined  systolic and diastolic congestive heart failure ([place]) 08/15/2018  . SOB (shortness of breath) 08/03/2018  . Volume overload 07/22/2018  . Acute systolic CHF (congestive heart failure) ([place]) 07/04/2018  . Acute on chronic combined systolic and diastolic heart failure ([place]) 04/01/2018  . Hypomagnesemia 04/01/2018  . Acute on chronic diastolic CHF (congestive heart failure) ([place]) 03/11/2018  . Pressure injury of skin 03/11/2018  . CHF (congestive heart failure) ([place]) 09/08/2017  . Altered mental status 05/11/2017  . Acute exacerbation of CHF (congestive heart failure) ([place]) 04/20/2017  . Acute on chronic systolic (congestive) heart failure ([place]) 04/19/2017  . CHF exacerbation ([place]) 04/11/2017  . Chronic respiratory failure with hypoxia ([place]) 04/11/2017  . Encounter for hospice care discussion   . Palliative care encounter   . Goals of care, counseling/discussion   . DNR (do not resuscitate)   . DNR (do not resuscitate) discussion   . Chronic congestive heart failure ([place]) 02/18/2017  . Acute systolic heart failure ([place]) 01/18/2017  . Pedal edema 12/02/2016  . Acute CHF (congestive heart failure) ([place]) 11/09/2016  . Acute on chronic systolic CHF (congestive heart failure) ([place]) 08/19/2016  . CKD (chronic kidney disease), stage II 05/17/2016  . Coronary arteries, normal Dec 2016 01/23/2016  . Chronic anticoagulation-Xarelto 01/23/2016  . NSVT (nonsustained ventricular tachycardia) ([place]) 01/23/2016  . Elevated troponin 12/21/2015  . Nonischemic cardiomyopathy ([place])   . Morbid  obesity due to excess calories (HCC)   . Noncompliance with diet and medication regimen 12/02/2015  . OSA (obstructive sleep apnea) 09/20/2015  . Pulmonary hypertension (HCC) 09/19/2015  . Normocytic anemia 09/19/2015  . Atrial fibrillation, chronic   . Dyspnea on exertion 05/28/2015  . Acute respiratory failure with hypoxia (HCC) 04/01/2015  . Hypokalemia 04/01/2015  . Obesity hypoventilation syndrome (HCC)  08/08/2014  . GERD (gastroesophageal reflux disease) 08/08/2014  . Edema of right lower extremity 06/21/2014  . Fecal urgency 06/21/2014  . Asthma 02/11/2009  . Hyperlipidemia 07/12/2008  . OBESITY, MORBID 07/12/2008  . Anxiety state 07/12/2008  . DEPRESSION 07/12/2008  . Essential hypertension 07/12/2008  . ALLERGIC RHINITIS 07/12/2008  . Peptic ulcer 07/12/2008  . OSTEOARTHRITIS 07/12/2008    Past Surgical History:  Procedure Laterality Date  . CARDIAC CATHETERIZATION N/A 06/14/2015   Procedure: Right/Left Heart Cath and Coronary Angiography;  Surgeon: Runell Gess, MD;  Location: [facility] INVASIVE CV LAB;  Service: Cardiovascular;  Laterality: N/A;        Home Medications    Prior to Admission medications   Medication Sig Start Date End Date Taking? Authorizing Provider  acetaminophen (TYLENOL) 325 MG tablet Take 2 tablets (650 mg total) by mouth every 4 (four) hours as needed for headache or mild pain. 11/02/18   Calvert Cantor, MD  albuterol (PROVENTIL) (2.5 MG/3ML) 0.083% nebulizer solution Take 2.5 mg by nebulization every 6 (six) hours as needed for wheezing or shortness of breath.    [provider]  atorvastatin (LIPITOR) 40 MG tablet Take 40 mg by mouth daily.  09/05/18   [provider]  bumetanide (BUMEX) 2 MG tablet Take 1 tablet (2 mg total) by mouth 3 (three) times daily for 30 days. 11/12/18 12/12/18  Sherryll Burger, Pratik D, DO  carvedilol (COREG) 25 MG tablet Take 1 tablet (25 mg total) by mouth 2 (two) times daily with a meal for 30 days. 08/18/18 11/04/19  Sherryll Burger, Pratik D, DO  docusate sodium (COLACE) 100 MG capsule Take 1 capsule (100 mg total) by mouth 2 (two) times daily. Patient taking differently: Take 100 mg by mouth every morning.  06/25/16   Kathlen Mody, MD  ferrous gluconate (FERGON) 324 MG tablet Take 1 tablet (324 mg total) by mouth 2 (two) times daily with a meal. Patient taking differently: Take 324 mg by mouth daily.  06/25/16   Kathlen Mody, MD   fluticasone (FLOVENT HFA) 220 MCG/ACT inhaler Inhale 2 puffs into the lungs daily as needed (for shortness of breath).     [provider]  lisinopril (ZESTRIL) 10 MG tablet Take 10 mg by mouth daily. 11/22/18   [provider]  oxyCODONE-acetaminophen (PERCOCET/ROXICET) 5-325 MG tablet Take 1 tablet by mouth every 6 (six) hours as needed. 11/30/18   Bethann Berkshire, MD  OXYGEN Inhale 3.5 L into the lungs continuous.     [provider]  pantoprazole (PROTONIX) 40 MG tablet Take 1 tablet (40 mg total) by mouth daily at 6 (six) AM. 06/26/16   Kathlen Mody, MD  potassium chloride SA (K-DUR) 20 MEQ tablet Take 2 tablets (40 mEq total) by mouth 2 (two) times daily. 10/07/18   Vassie Loll, MD  rivaroxaban (XARELTO) 20 MG TABS tablet Take 1 tablet (20 mg total) by mouth daily with breakfast. Patient taking differently: Take 20 mg by mouth daily with supper.  06/26/16   Kathlen Mody, MD  silver sulfADIAZINE (SILVADENE) 1 % cream Apply 1 application topically daily. 11/28/18   Layden,  Early CharsLindsey A, PA-C  spironolactone (ALDACTONE) 100 MG tablet Take 100 mg by mouth daily. For fluid 03/18/17   [provider]  tamsulosin (FLOMAX) 0.4 MG CAPS capsule Take 1 capsule (0.4 mg total) by mouth daily. 06/25/16   Kathlen ModyAkula, Vijaya, MD  potassium chloride 20 MEQ TBCR Take 20 mEq by mouth 2 (two) times daily. 09/03/15 11/04/18  Ward, Layla MawKristen N, DO    Family History Family History  Problem Relation Age of Onset  . Stroke Father   . Heart attack Father   . Aneurysm Mother        Cerebral aneurysm  . Hypertension Sister   . Colon cancer Neg Hx   . Inflammatory bowel disease Neg Hx   . Liver disease Neg Hx     Social History Social History   Tobacco Use  . Smoking status: Former Smoker    Packs/day: 0.50    Years: 20.00    Pack years: 10.00    Types: Cigarettes    Start date: 04/26/1988    Last attempt to quit: 06/23/2007    Years since quitting: 11.4  . Smokeless tobacco: Never Used   . Tobacco comment: 1 ppd former smoker  Substance Use Topics  . Alcohol use: Not Currently    Alcohol/week: 0.0 standard drinks    Comment: No etoh since 2009  . Drug use: No     Allergies   Banana; Bee venom; Strawberry flavor; Aspirin; Metolazone; Oatmeal; Orange juice [orange oil]; Torsemide; Diltiazem; Hydralazine; and Lipitor [atorvastatin]   Review of Systems Review of Systems  Constitutional: Negative for appetite change and fatigue.  HENT: Negative for congestion, ear discharge and sinus pressure.   Eyes: Negative for discharge.  Respiratory: Negative for cough.   Cardiovascular: Negative for chest pain.  Gastrointestinal: Negative for abdominal pain and diarrhea.  Genitourinary: Negative for frequency and hematuria.  Musculoskeletal: Negative for back pain.       Right foot pain  Skin: Negative for rash.  Neurological: Negative for seizures and headaches.  Psychiatric/Behavioral: Negative for hallucinations.     Physical Exam Updated Vital Signs BP (!) 156/108 (BP Location: Left Arm)   Pulse (!) 50   Temp 98.2 F (36.8 C) (Oral)   Resp (!) 22   Ht 6' (1.829 m)   Wt (!) 170.8 kg   SpO2 99%   BMI 51.06 kg/m   Physical Exam Vitals signs reviewed.  Constitutional:      Appearance: Normal appearance. He is well-developed.  HENT:     Head: Normocephalic.     Nose: Nose normal.  Eyes:     Conjunctiva/sclera: Conjunctivae normal.  Neck:     Trachea: No tracheal deviation.  Cardiovascular:     Heart sounds: No murmur.  Musculoskeletal: Normal range of motion.     Comments: 3plus edema in legs  Skin:    General: Skin is warm.  Neurological:     Mental Status: He is alert and oriented to person, place, and time.      ED Treatments / Results  Labs (all labs ordered are listed, but only abnormal results are displayed) Labs Reviewed - No data to display  EKG None  Radiology No results found.  Procedures Procedures (including critical care  time)  Medications Ordered in ED Medications  oxyCODONE-acetaminophen (PERCOCET/ROXICET) 5-325 MG per tablet 1 tablet (1 tablet Oral Given 11/30/18 2225)     Initial Impression / Assessment and Plan / ED Course  I have reviewed the triage vital signs and  the nursing notes.  Pertinent labs & imaging results that were available during my care of the patient were reviewed by me and considered in my medical decision making (see chart for details).   pt with pain in right foot from edema    Pt given percocet,  Six tablets and will follow up with his md  Final Clinical Impressions(s) / ED Diagnoses   Final diagnoses:  Foot pain, right    ED Discharge Orders         Ordered    oxyCODONE-acetaminophen (PERCOCET/ROXICET) 5-325 MG tablet  Every 6 hours PRN     11/30/18 2303           Bethann BerkshireZammit, Wilgus Deyton, MD 12/01/18 2036    Bethann BerkshireZammit, Starlynn Klinkner, MD 12/08/18 1700

## 2018-11-30 NOTE — Patient Outreach (Signed)
[facility] [facility]) Care Management  11/30/2018  SILVIA MARKUSON Nov 08, 1965 [id number]   Follow up to confirm that [name] attended his scheduled follow-up at [facility] today. He required an office visit prior to a wound consult being placed. Mr. Brandl reports attending the appointment but being told the referral would not be submitted. Reports he was instructed to monitor the drainage to his right leg and take pain medications. States prescription was ordered today. He is unable to recall the name of the medication. Call placed to [facility] Left voice message for assigned nurse, Magda Paganini.   PLAN -Pending call back from [place] Pharmacist. -Will follow up with Mr. Virts this week.   [place] [phone]

## 2018-12-01 MED FILL — Oxycodone w/ Acetaminophen Tab 5-325 MG: ORAL | Qty: 6 | Status: AC

## 2018-12-02 ENCOUNTER — Other Ambulatory Visit: Payer: Self-pay

## 2018-12-02 NOTE — Patient Outreach (Signed)
[facility]) Care Management  12/02/2018  [name] Aug 19, 1965 [id number]  Follow-up outreach with [name]. Since our last conversation, he was evaluated in the ED for pain in his right foot. He has a history of chronic lymphedema. Describes the lower extremity edema as "the same as usual." Reports being tired but states "I feel ok today." Denies complaints of chest discomfort, palpitations or shortness of breath.  [name] did not weigh today. Reports weight on yesterday was 377lbs.  Reviewed medications and diet. He is not taking Bumex as prescribed. He is aware that it is to be taken three times a day. He continues to skip doses. When reviewing his diet, he recalls eating eggs and cereal for breakfast. Reports lunch consisted of chicken and hushpuppies. He admits to consuming [place] fries, hot dogs and hamburgers this week.  Discussed concerns regarding [name]'s continued noncompliance with treatment recommendations. He has been offered several services to include In-home IV furosemide, the Home Health Heart Failure program, medication compliance packages, Nutritional Counseling, participation in the Advanced Heart Failure clinical program and transportation assistance. He continues to decline these services. During his most recent hospitalization, he agreed to standard home health services. Today he reports not wanting to engage. He was also recently referred for temporary meal delivery. This offers heart healthy meal options. He admits to using these meals as "back-ups" and continues to consume foods that he prefers. He states, "maintaining a low sodium diet is impossible." Today, he blames his insurance company for his ongoing noncompliance. He reports speaking with a representative regarding coverage for oral Lasix. He is adamant that the generic brand is not "real medicine." He states, "I told them we can do this the easy way or the hard way." He goes on to state,  "They chose the hard way, so I'll just keep going to the Emergency Room."    [name] is very adamant that his choices are not contributing to his chronic complications. He is not receptive to additional referrals but agreeable to ongoing [facility] outreach. I will attempt to have another discussion with him next week.  PLAN -Will follow-up next week.   [place] [phone]

## 2018-12-05 DIAGNOSIS — R7989 Other specified abnormal findings of blood chemistry: Secondary | ICD-10-CM | POA: Diagnosis not present

## 2018-12-05 DIAGNOSIS — Z886 Allergy status to analgesic agent status: Secondary | ICD-10-CM | POA: Diagnosis not present

## 2018-12-05 DIAGNOSIS — Z888 Allergy status to other drugs, medicaments and biological substances status: Secondary | ICD-10-CM | POA: Diagnosis not present

## 2018-12-05 DIAGNOSIS — L03115 Cellulitis of right lower limb: Secondary | ICD-10-CM | POA: Diagnosis not present

## 2018-12-05 DIAGNOSIS — I4891 Unspecified atrial fibrillation: Secondary | ICD-10-CM | POA: Diagnosis not present

## 2018-12-05 DIAGNOSIS — Z79899 Other long term (current) drug therapy: Secondary | ICD-10-CM | POA: Diagnosis not present

## 2018-12-05 DIAGNOSIS — E7800 Pure hypercholesterolemia, unspecified: Secondary | ICD-10-CM | POA: Diagnosis not present

## 2018-12-05 DIAGNOSIS — I10 Essential (primary) hypertension: Secondary | ICD-10-CM | POA: Diagnosis not present

## 2018-12-05 DIAGNOSIS — Z87891 Personal history of nicotine dependence: Secondary | ICD-10-CM | POA: Diagnosis not present

## 2018-12-05 DIAGNOSIS — I509 Heart failure, unspecified: Secondary | ICD-10-CM | POA: Diagnosis not present

## 2018-12-05 DIAGNOSIS — Z7901 Long term (current) use of anticoagulants: Secondary | ICD-10-CM | POA: Diagnosis not present

## 2018-12-05 DIAGNOSIS — J449 Chronic obstructive pulmonary disease, unspecified: Secondary | ICD-10-CM | POA: Diagnosis not present

## 2018-12-05 DIAGNOSIS — R609 Edema, unspecified: Secondary | ICD-10-CM | POA: Diagnosis not present

## 2018-12-05 DIAGNOSIS — R0602 Shortness of breath: Secondary | ICD-10-CM | POA: Diagnosis not present

## 2018-12-05 DIAGNOSIS — M7989 Other specified soft tissue disorders: Secondary | ICD-10-CM | POA: Diagnosis not present

## 2018-12-05 DIAGNOSIS — Z91030 Bee allergy status: Secondary | ICD-10-CM | POA: Diagnosis not present

## 2018-12-05 DIAGNOSIS — Z9102 Food additives allergy status: Secondary | ICD-10-CM | POA: Diagnosis not present

## 2018-12-05 DIAGNOSIS — Z91018 Allergy to other foods: Secondary | ICD-10-CM | POA: Diagnosis not present

## 2018-12-05 DIAGNOSIS — I110 Hypertensive heart disease with heart failure: Secondary | ICD-10-CM | POA: Diagnosis not present

## 2018-12-05 DIAGNOSIS — K219 Gastro-esophageal reflux disease without esophagitis: Secondary | ICD-10-CM | POA: Diagnosis not present

## 2018-12-05 DIAGNOSIS — I252 Old myocardial infarction: Secondary | ICD-10-CM | POA: Diagnosis not present

## 2018-12-06 DIAGNOSIS — I482 Chronic atrial fibrillation, unspecified: Secondary | ICD-10-CM | POA: Diagnosis not present

## 2018-12-06 DIAGNOSIS — I429 Cardiomyopathy, unspecified: Secondary | ICD-10-CM | POA: Diagnosis not present

## 2018-12-06 DIAGNOSIS — E785 Hyperlipidemia, unspecified: Secondary | ICD-10-CM | POA: Diagnosis not present

## 2018-12-06 DIAGNOSIS — I890 Lymphedema, not elsewhere classified: Secondary | ICD-10-CM | POA: Diagnosis not present

## 2018-12-06 DIAGNOSIS — I252 Old myocardial infarction: Secondary | ICD-10-CM | POA: Diagnosis not present

## 2018-12-06 DIAGNOSIS — J449 Chronic obstructive pulmonary disease, unspecified: Secondary | ICD-10-CM | POA: Diagnosis not present

## 2018-12-06 DIAGNOSIS — R238 Other skin changes: Secondary | ICD-10-CM | POA: Diagnosis not present

## 2018-12-06 DIAGNOSIS — K219 Gastro-esophageal reflux disease without esophagitis: Secondary | ICD-10-CM | POA: Diagnosis not present

## 2018-12-06 DIAGNOSIS — I5042 Chronic combined systolic (congestive) and diastolic (congestive) heart failure: Secondary | ICD-10-CM | POA: Diagnosis not present

## 2018-12-06 DIAGNOSIS — I272 Pulmonary hypertension, unspecified: Secondary | ICD-10-CM | POA: Diagnosis not present

## 2018-12-06 DIAGNOSIS — I130 Hypertensive heart and chronic kidney disease with heart failure and stage 1 through stage 4 chronic kidney disease, or unspecified chronic kidney disease: Secondary | ICD-10-CM | POA: Diagnosis not present

## 2018-12-06 DIAGNOSIS — N182 Chronic kidney disease, stage 2 (mild): Secondary | ICD-10-CM | POA: Diagnosis not present

## 2018-12-06 DIAGNOSIS — G4733 Obstructive sleep apnea (adult) (pediatric): Secondary | ICD-10-CM | POA: Diagnosis not present

## 2018-12-06 DIAGNOSIS — L97811 Non-pressure chronic ulcer of other part of right lower leg limited to breakdown of skin: Secondary | ICD-10-CM | POA: Diagnosis not present

## 2018-12-06 DIAGNOSIS — J9611 Chronic respiratory failure with hypoxia: Secondary | ICD-10-CM | POA: Diagnosis not present

## 2018-12-06 DIAGNOSIS — I472 Ventricular tachycardia: Secondary | ICD-10-CM | POA: Diagnosis not present

## 2018-12-06 DIAGNOSIS — I739 Peripheral vascular disease, unspecified: Secondary | ICD-10-CM | POA: Diagnosis not present

## 2018-12-06 DIAGNOSIS — D631 Anemia in chronic kidney disease: Secondary | ICD-10-CM | POA: Diagnosis not present

## 2018-12-06 DIAGNOSIS — I872 Venous insufficiency (chronic) (peripheral): Secondary | ICD-10-CM | POA: Diagnosis not present

## 2018-12-06 DIAGNOSIS — K279 Peptic ulcer, site unspecified, unspecified as acute or chronic, without hemorrhage or perforation: Secondary | ICD-10-CM | POA: Diagnosis not present

## 2018-12-06 DIAGNOSIS — M1990 Unspecified osteoarthritis, unspecified site: Secondary | ICD-10-CM | POA: Diagnosis not present

## 2018-12-07 ENCOUNTER — Other Ambulatory Visit: Payer: Self-pay

## 2018-12-07 ENCOUNTER — Observation Stay (HOSPITAL_COMMUNITY)
Admission: EM | Admit: 2018-12-07 | Discharge: 2018-12-09 | Disposition: A | Discharge status: Home / Self Care | Payer: Medicare Other | Attending: Internal Medicine | Admitting: Internal Medicine

## 2018-12-07 DIAGNOSIS — I509 Heart failure, unspecified: Secondary | ICD-10-CM

## 2018-12-07 DIAGNOSIS — I110 Hypertensive heart disease with heart failure: Secondary | ICD-10-CM | POA: Diagnosis not present

## 2018-12-07 DIAGNOSIS — R0602 Shortness of breath: Secondary | ICD-10-CM | POA: Diagnosis not present

## 2018-12-07 DIAGNOSIS — Y999 Unspecified external cause status: Secondary | ICD-10-CM | POA: Diagnosis not present

## 2018-12-07 DIAGNOSIS — M79661 Pain in right lower leg: Secondary | ICD-10-CM | POA: Diagnosis not present

## 2018-12-07 DIAGNOSIS — K219 Gastro-esophageal reflux disease without esophagitis: Secondary | ICD-10-CM | POA: Diagnosis not present

## 2018-12-07 DIAGNOSIS — I10 Essential (primary) hypertension: Secondary | ICD-10-CM | POA: Diagnosis present

## 2018-12-07 DIAGNOSIS — E785 Hyperlipidemia, unspecified: Secondary | ICD-10-CM | POA: Insufficient documentation

## 2018-12-07 DIAGNOSIS — I480 Paroxysmal atrial fibrillation: Secondary | ICD-10-CM | POA: Diagnosis not present

## 2018-12-07 DIAGNOSIS — L0390 Cellulitis, unspecified: Secondary | ICD-10-CM | POA: Diagnosis present

## 2018-12-07 DIAGNOSIS — I4891 Unspecified atrial fibrillation: Secondary | ICD-10-CM | POA: Diagnosis not present

## 2018-12-07 DIAGNOSIS — Z20828 Contact with and (suspected) exposure to other viral communicable diseases: Secondary | ICD-10-CM | POA: Insufficient documentation

## 2018-12-07 DIAGNOSIS — R601 Generalized edema: Secondary | ICD-10-CM | POA: Diagnosis not present

## 2018-12-07 DIAGNOSIS — I499 Cardiac arrhythmia, unspecified: Secondary | ICD-10-CM | POA: Diagnosis not present

## 2018-12-07 DIAGNOSIS — Z7901 Long term (current) use of anticoagulants: Secondary | ICD-10-CM | POA: Diagnosis not present

## 2018-12-07 DIAGNOSIS — R140 Abdominal distension (gaseous): Secondary | ICD-10-CM | POA: Diagnosis not present

## 2018-12-07 DIAGNOSIS — L03115 Cellulitis of right lower limb: Principal | ICD-10-CM | POA: Insufficient documentation

## 2018-12-07 DIAGNOSIS — Z79899 Other long term (current) drug therapy: Secondary | ICD-10-CM | POA: Insufficient documentation

## 2018-12-07 DIAGNOSIS — I5043 Acute on chronic combined systolic (congestive) and diastolic (congestive) heart failure: Secondary | ICD-10-CM | POA: Insufficient documentation

## 2018-12-07 NOTE — Patient Outreach (Signed)
[facility]) Care Management  12/07/2018  LAURICE KIMMONS May 25, 1966 [id number]   Follow-up outreach with Mr. Broz. He was evaluated at [facility] earlier this week due to leg pain. Reports being told that his leg was infected. Per [facility] pharmacy records, Cephalexin 500mg  was prescribed. He reports "doing ok" during the call. Denied complaints of shortness of breath. Reports that his legs aren't as swollen, but the right leg continues to drain fluid. Reports a weight of 377lbs.  Medications-During our conversation last week Mr. Nole indicated that he was not taking his medications as prescribed. Today he reports taking one dose earlier today. He is aware that bumetanide is to be taken three times a day. His primary care NP recently ordered furosemide 40mg  due to his noncompliance with bumetanide. He continues to insist that he is not receiving "real medicine." Reports he would take his medication as prescribed if he could receive brand Lasix.   Home Health-Readdressed concerns regarding home health. As of last week, he was not agreeable, and care was not established. He reports receiving several calls. Unsure if it was the agency attempting to establish care. Discussed benefits of nursing visits and wound care. Today he says he will consider services.   Call placed to [facility] and [facility]. [facility] coordinator confirmed receipt of his referral. They will attempt to establish care within the next week if staffing permits. Discussed home health options with clinic nurse, Magda Paganini. [facility] and [facility] were not able to take his case. [facility] declined the referral. Will consider services with [facility] if [facility] is unable to staff the case. Per Magda Paganini, a referral was placed for wound care via [facility]. Approval pending.  PLAN -Will follow-up on 12/09/18.   [place] [phone]

## 2018-12-08 ENCOUNTER — Emergency Department (HOSPITAL_COMMUNITY): Payer: Medicare Other

## 2018-12-08 ENCOUNTER — Encounter (HOSPITAL_COMMUNITY): Payer: Self-pay

## 2018-12-08 ENCOUNTER — Other Ambulatory Visit: Payer: Self-pay

## 2018-12-08 ENCOUNTER — Observation Stay (HOSPITAL_BASED_OUTPATIENT_CLINIC_OR_DEPARTMENT_OTHER): Payer: Medicare Other

## 2018-12-08 DIAGNOSIS — I5041 Acute combined systolic (congestive) and diastolic (congestive) heart failure: Secondary | ICD-10-CM

## 2018-12-08 DIAGNOSIS — I509 Heart failure, unspecified: Secondary | ICD-10-CM | POA: Diagnosis not present

## 2018-12-08 DIAGNOSIS — L03115 Cellulitis of right lower limb: Secondary | ICD-10-CM | POA: Diagnosis not present

## 2018-12-08 DIAGNOSIS — R0602 Shortness of breath: Secondary | ICD-10-CM | POA: Diagnosis not present

## 2018-12-08 DIAGNOSIS — L0390 Cellulitis, unspecified: Secondary | ICD-10-CM | POA: Diagnosis present

## 2018-12-08 DIAGNOSIS — R601 Generalized edema: Secondary | ICD-10-CM | POA: Diagnosis not present

## 2018-12-08 DIAGNOSIS — M79661 Pain in right lower leg: Secondary | ICD-10-CM | POA: Diagnosis not present

## 2018-12-08 LAB — COMPREHENSIVE METABOLIC PANEL
ALT: 16 U/L (ref 0–44)
AST: 18 U/L (ref 15–41)
Albumin: 3.6 g/dL (ref 3.5–5.0)
Alkaline Phosphatase: 75 U/L (ref 38–126)
Anion gap: 10 (ref 5–15)
BUN: 15 mg/dL (ref 6–20)
CO2: 24 mmol/L (ref 22–32)
Calcium: 8.7 mg/dL — ABNORMAL LOW (ref 8.9–10.3)
Chloride: 106 mmol/L (ref 98–111)
Creatinine, Ser: 0.98 mg/dL (ref 0.61–1.24)
GFR calc Af Amer: >60 mL/min (ref >60)
GFR calc non Af Amer: >60 mL/min (ref >60)
Glucose, Bld: 97 mg/dL (ref 70–99)
Potassium: 4 mmol/L (ref 3.5–5.1)
Sodium: 140 mmol/L (ref 135–145)
Total Bilirubin: 0.7 mg/dL (ref 0.3–1.2)
Total Protein: 7.4 g/dL (ref 6.5–8.1)

## 2018-12-08 LAB — TROPONIN I
Troponin I: 0.03 ng/mL (ref <0.03)
Troponin I: <0.03 ng/mL (ref <0.03)
Troponin I: <0.03 ng/mL (ref <0.03)
Troponin I: <0.03 ng/mL (ref <0.03)

## 2018-12-08 LAB — CBC WITH DIFFERENTIAL/PLATELET
Abs Immature Granulocytes: 0.01 10*3/uL (ref 0.00–0.07)
Basophils Absolute: 0 10*3/uL (ref 0.0–0.1)
Basophils Relative: 1 %
Eosinophils Absolute: 0.2 10*3/uL (ref 0.0–0.5)
Eosinophils Relative: 4 %
HCT: 39.4 % (ref 39.0–52.0)
Hemoglobin: 12.6 g/dL — ABNORMAL LOW (ref 13.0–17.0)
Immature Granulocytes: 0 %
Lymphocytes Relative: 22 %
Lymphs Abs: 0.9 10*3/uL (ref 0.7–4.0)
MCH: 28.2 pg (ref 26.0–34.0)
MCHC: 32 g/dL (ref 30.0–36.0)
MCV: 88.1 fL (ref 80.0–100.0)
Monocytes Absolute: 0.5 10*3/uL (ref 0.1–1.0)
Monocytes Relative: 12 %
Neutro Abs: 2.7 10*3/uL (ref 1.7–7.7)
Neutrophils Relative %: 61 %
Platelets: 223 10*3/uL (ref 150–400)
RBC: 4.47 MIL/uL (ref 4.22–5.81)
RDW: 14.6 % (ref 11.5–15.5)
WBC: 4.3 10*3/uL (ref 4.0–10.5)
nRBC: 0 % (ref 0.0–0.2)

## 2018-12-08 LAB — SARS CORONAVIRUS 2 BY RT PCR (HOSPITAL ORDER, PERFORMED IN ~~LOC~~ HOSPITAL LAB): SARS Coronavirus 2: NEGATIVE

## 2018-12-08 LAB — LACTIC ACID, PLASMA
Lactic Acid, Venous: 0.7 mmol/L (ref 0.5–1.9)
Lactic Acid, Venous: 0.8 mmol/L (ref 0.5–1.9)

## 2018-12-08 LAB — BRAIN NATRIURETIC PEPTIDE: B Natriuretic Peptide: 224 pg/mL — ABNORMAL HIGH (ref 0.0–100.0)

## 2018-12-08 MED ORDER — DOCUSATE SODIUM 100 MG PO CAPS
100.0000 mg | ORAL_CAPSULE | Freq: Every morning | ORAL | Status: DC
  Administered 2018-12-08 – 2018-12-09 (×2): 100 mg via ORAL
  Filled 2018-12-08 (×2): qty 1

## 2018-12-08 MED ORDER — POTASSIUM CHLORIDE CRYS ER 20 MEQ PO TBCR
40.0000 meq | EXTENDED_RELEASE_TABLET | Freq: Two times a day (BID) | ORAL | Status: DC
  Administered 2018-12-08 – 2018-12-09 (×3): 40 meq via ORAL
  Filled 2018-12-08 (×3): qty 2

## 2018-12-08 MED ORDER — SODIUM CHLORIDE 0.9 % IV SOLN
2.0000 g | INTRAVENOUS | Status: DC
  Administered 2018-12-08 – 2018-12-09 (×2): 2 g via INTRAVENOUS
  Filled 2018-12-08 (×2): qty 20

## 2018-12-08 MED ORDER — ATORVASTATIN CALCIUM 40 MG PO TABS
40.0000 mg | ORAL_TABLET | Freq: Every day | ORAL | Status: DC
  Administered 2018-12-08 – 2018-12-09 (×2): 40 mg via ORAL
  Filled 2018-12-08 (×2): qty 1

## 2018-12-08 MED ORDER — FERROUS SULFATE 325 (65 FE) MG PO TABS
325.0000 mg | ORAL_TABLET | Freq: Every day | ORAL | Status: DC
  Administered 2018-12-08 – 2018-12-09 (×2): 325 mg via ORAL
  Filled 2018-12-08 (×2): qty 1

## 2018-12-08 MED ORDER — RIVAROXABAN 20 MG PO TABS
20.0000 mg | ORAL_TABLET | Freq: Every day | ORAL | Status: DC
  Administered 2018-12-08 – 2018-12-09 (×2): 20 mg via ORAL
  Filled 2018-12-08 (×2): qty 1

## 2018-12-08 MED ORDER — SODIUM CHLORIDE 0.9 % IV SOLN: 250.0000 mL | INTRAVENOUS | Status: DC | PRN

## 2018-12-08 MED ORDER — SPIRONOLACTONE 25 MG PO TABS
100.0000 mg | ORAL_TABLET | Freq: Every day | ORAL | Status: DC
  Administered 2018-12-08 – 2018-12-09 (×2): 100 mg via ORAL
  Filled 2018-12-08 (×2): qty 4

## 2018-12-08 MED ORDER — VANCOMYCIN HCL IN DEXTROSE 1-5 GM/200ML-% IV SOLN
1000.0000 mg | Freq: Once | INTRAVENOUS | Status: AC
  Administered 2018-12-08: 1000 mg via INTRAVENOUS
  Filled 2018-12-08: qty 200

## 2018-12-08 MED ORDER — CARVEDILOL 12.5 MG PO TABS
25.0000 mg | ORAL_TABLET | Freq: Two times a day (BID) | ORAL | Status: DC
  Administered 2018-12-08 – 2018-12-09 (×4): 25 mg via ORAL
  Filled 2018-12-08 (×4): qty 2

## 2018-12-08 MED ORDER — SILVER SULFADIAZINE 1 % EX CREA
1.0000 "application " | TOPICAL_CREAM | Freq: Every day | CUTANEOUS | Status: DC
  Administered 2018-12-09: 1 via TOPICAL
  Filled 2018-12-08: qty 85

## 2018-12-08 MED ORDER — PANTOPRAZOLE SODIUM 40 MG PO TBEC
40.0000 mg | DELAYED_RELEASE_TABLET | Freq: Every day | ORAL | Status: DC
  Administered 2018-12-08 – 2018-12-09 (×2): 40 mg via ORAL
  Filled 2018-12-08: qty 1

## 2018-12-08 MED ORDER — ACETAMINOPHEN 650 MG RE SUPP: 650.0000 mg | Freq: Four times a day (QID) | RECTAL | Status: DC | PRN

## 2018-12-08 MED ORDER — FUROSEMIDE 10 MG/ML IJ SOLN
40.0000 mg | Freq: Two times a day (BID) | INTRAMUSCULAR | Status: DC
  Administered 2018-12-08 – 2018-12-09 (×3): 40 mg via INTRAVENOUS
  Filled 2018-12-08 (×3): qty 4

## 2018-12-08 MED ORDER — VANCOMYCIN HCL IN DEXTROSE 1-5 GM/200ML-% IV SOLN
1000.0000 mg | INTRAVENOUS | Status: AC
  Administered 2018-12-08: 1000 mg via INTRAVENOUS
  Filled 2018-12-08: qty 200

## 2018-12-08 MED ORDER — SODIUM CHLORIDE 0.9% FLUSH
3.0000 mL | Freq: Two times a day (BID) | INTRAVENOUS | Status: DC
  Administered 2018-12-08 – 2018-12-09 (×3): 3 mL via INTRAVENOUS

## 2018-12-08 MED ORDER — PERFLUTREN LIPID MICROSPHERE
1.0000 mL | INTRAVENOUS | Status: AC | PRN
  Administered 2018-12-08: 2 mL via INTRAVENOUS
  Administered 2018-12-08 (×2): 1 mL via INTRAVENOUS
  Filled 2018-12-08: qty 10

## 2018-12-08 MED ORDER — LISINOPRIL 10 MG PO TABS
10.0000 mg | ORAL_TABLET | Freq: Every day | ORAL | Status: DC
  Administered 2018-12-08 – 2018-12-09 (×2): 10 mg via ORAL
  Filled 2018-12-08 (×3): qty 1

## 2018-12-08 MED ORDER — ACETAMINOPHEN 325 MG PO TABS: 650.0000 mg | ORAL_TABLET | Freq: Four times a day (QID) | ORAL | Status: DC | PRN

## 2018-12-08 MED ORDER — OXYCODONE-ACETAMINOPHEN 5-325 MG PO TABS: 1.0000 | ORAL_TABLET | Freq: Four times a day (QID) | ORAL | Status: DC | PRN

## 2018-12-08 MED ORDER — SODIUM CHLORIDE 0.9% FLUSH: 3.0000 mL | INTRAVENOUS | Status: DC | PRN

## 2018-12-08 MED ORDER — IOHEXOL 300 MG/ML  SOLN
100.0000 mL | Freq: Once | INTRAMUSCULAR | Status: AC | PRN
  Administered 2018-12-08: 100 mL via INTRAVENOUS

## 2018-12-08 MED ORDER — VANCOMYCIN HCL IN DEXTROSE 1-5 GM/200ML-% IV SOLN: 1000.0000 mg | Freq: Once | INTRAVENOUS | Status: DC

## 2018-12-08 MED ORDER — VANCOMYCIN HCL 10 G IV SOLR: 1500.0000 mg | Freq: Once | INTRAVENOUS | Status: DC

## 2018-12-08 MED ORDER — VANCOMYCIN HCL 10 G IV SOLR
1750.0000 mg | Freq: Two times a day (BID) | INTRAVENOUS | Status: DC
  Administered 2018-12-08 – 2018-12-09 (×2): 1750 mg via INTRAVENOUS
  Filled 2018-12-08 (×8): qty 1750

## 2018-12-08 MED ORDER — FUROSEMIDE 10 MG/ML IJ SOLN
40.0000 mg | Freq: Once | INTRAMUSCULAR | Status: AC
  Administered 2018-12-08: 40 mg via INTRAVENOUS
  Filled 2018-12-08: qty 4

## 2018-12-08 MED ORDER — TAMSULOSIN HCL 0.4 MG PO CAPS
0.4000 mg | ORAL_CAPSULE | Freq: Every day | ORAL | Status: DC
  Administered 2018-12-08 – 2018-12-09 (×2): 0.4 mg via ORAL
  Filled 2018-12-08 (×2): qty 1

## 2018-12-08 MED ORDER — ALBUTEROL SULFATE (2.5 MG/3ML) 0.083% IN NEBU: 2.5000 mg | INHALATION_SOLUTION | Freq: Four times a day (QID) | RESPIRATORY_TRACT | Status: DC | PRN

## 2018-12-08 NOTE — ED Provider Notes (Signed)
[facility] EMERGENCY DEPARTMENT Provider Note   CSN: [id number] Arrival date & time: 12/07/18  2351     History   Chief Complaint Chief Complaint  Patient presents with  . Shortness of Breath    HPI CHRISHUN KIMURA is a 53 y.o. male.     is a 53 y.o. male with medical history significant for chronic combined systolic and diastolic CHF with recurrent readmissions due to dietary medication noncompliance, atrial fibrillation on Xarelto, hypertension, MI, asthma, OSA on CPAP, and morbid obesity  Presents with a several day history of shortness of breath and worsening lower extremity edema right more than left.  Patient discharged from the hospital last week after CHF exacerbation.  States compliance with his medications.  States he is having increased shortness of breath with fleeting central chest pain coming and going lasting for several minutes to hours at a time.  Has had increased swelling and pain in his right leg.  Believes he is still taking antibiotics for the swelling but he is not certain.  Denies any fevers.  Has had increased clear drainage from the right leg.  States compliance with his home medications including his oxygen, diuretics and Xarelto.  The history is provided by the EMS personnel and the patient.  Shortness of Breath Associated symptoms: chest pain and cough   Associated symptoms: no headaches and no vomiting     Past Medical History:  Diagnosis Date  . Allergic rhinitis   . Asthma   . Atrial fibrillation (HCC)   . CHF (congestive heart failure) (HCC)   . Elevated troponin level not due myocardial infarction 06/13/2015   cath w/ nl cors, felt 2nd HTN, CHF, peat trop 2.73  . Essential hypertension   . Gastroesophageal reflux disease   . History of cardiac catheterization    Normal coronaries December 2016  . Noncompliance    Major problem leading to declining health and recurrent hospitalization  . Nonischemic cardiomyopathy (HCC)    a. LVEF 20-25% in  2017 with cath in 2016 showing normal cors. b. EF 55% by echo in 03/2018.  . On home O2    3L N/C  . OSA (obstructive sleep apnea)   . Osteoarthritis   . Peptic ulcer disease     Patient Active Problem List   Diagnosis Date Noted  . Chronic obstructive pulmonary disease (HCC)   . Benign prostatic hyperplasia without lower urinary tract symptoms   . Acute on chronic combined systolic and diastolic CHF (congestive heart failure) (HCC) 10/02/2018  . AKI (acute kidney injury) (HCC) 09/18/2018  . Acute on chronic combined systolic and diastolic congestive heart failure (HCC) 08/15/2018  . SOB (shortness of breath) 08/03/2018  . Volume overload 07/22/2018  . Acute systolic CHF (congestive heart failure) (HCC) 07/04/2018  . Acute on chronic combined systolic and diastolic heart failure (HCC) 04/01/2018  . Hypomagnesemia 04/01/2018  . Acute on chronic diastolic CHF (congestive heart failure) (HCC) 03/11/2018  . Pressure injury of skin 03/11/2018  . CHF (congestive heart failure) (HCC) 09/08/2017  . Altered mental status 05/11/2017  . Acute exacerbation of CHF (congestive heart failure) (HCC) 04/20/2017  . Acute on chronic systolic (congestive) heart failure (HCC) 04/19/2017  . CHF exacerbation (HCC) 04/11/2017  . Chronic respiratory failure with hypoxia (HCC) 04/11/2017  . Encounter for hospice care discussion   . Palliative care encounter   . Goals of care, counseling/discussion   . DNR (do not resuscitate)   . DNR (do not resuscitate) discussion   .  Chronic congestive heart failure (HCC) 02/18/2017  . Acute systolic heart failure (HCC) 01/18/2017  . Pedal edema 12/02/2016  . Acute CHF (congestive heart failure) (HCC) 11/09/2016  . Acute on chronic systolic CHF (congestive heart failure) (HCC) 08/19/2016  . CKD (chronic kidney disease), stage II 05/17/2016  . Coronary arteries, normal Dec 2016 01/23/2016  . Chronic anticoagulation-Xarelto 01/23/2016  . NSVT (nonsustained ventricular  tachycardia) (HCC) 01/23/2016  . Elevated troponin 12/21/2015  . Nonischemic cardiomyopathy (HCC)   . Morbid obesity due to excess calories (HCC)   . Noncompliance with diet and medication regimen 12/02/2015  . OSA (obstructive sleep apnea) 09/20/2015  . Pulmonary hypertension (HCC) 09/19/2015  . Normocytic anemia 09/19/2015  . Atrial fibrillation, chronic   . Dyspnea on exertion 05/28/2015  . Acute respiratory failure with hypoxia (HCC) 04/01/2015  . Hypokalemia 04/01/2015  . Obesity hypoventilation syndrome (HCC) 08/08/2014  . GERD (gastroesophageal reflux disease) 08/08/2014  . Edema of right lower extremity 06/21/2014  . Fecal urgency 06/21/2014  . Asthma 02/11/2009  . Hyperlipidemia 07/12/2008  . OBESITY, MORBID 07/12/2008  . Anxiety state 07/12/2008  . DEPRESSION 07/12/2008  . Essential hypertension 07/12/2008  . ALLERGIC RHINITIS 07/12/2008  . Peptic ulcer 07/12/2008  . OSTEOARTHRITIS 07/12/2008    Past Surgical History:  Procedure Laterality Date  . CARDIAC CATHETERIZATION N/A 06/14/2015   Procedure: Right/Left Heart Cath and Coronary Angiography;  Surgeon: Runell GessJonathan J Berry, MD;  [facility];  Service: Cardiovascular;  Laterality: N/A;        Home Medications    Prior to Admission medications   Medication Sig Start Date End Date Taking? Authorizing Provider  acetaminophen (TYLENOL) 325 MG tablet Take 2 tablets (650 mg total) by mouth every 4 (four) hours as needed for headache or mild pain. 11/02/18   Calvert Cantorizwan, Saima, MD  albuterol (PROVENTIL) (2.5 MG/3ML) 0.083% nebulizer solution Take 2.5 mg by nebulization every 6 (six) hours as needed for wheezing or shortness of breath.    [provider]  atorvastatin (LIPITOR) 40 MG tablet Take 40 mg by mouth daily.  09/05/18   [provider]  bumetanide (BUMEX) 2 MG tablet Take 1 tablet (2 mg total) by mouth 3 (three) times daily for 30 days. 11/12/18 12/12/18  Sherryll BurgerShah, Pratik D, DO  carvedilol  (COREG) 25 MG tablet Take 1 tablet (25 mg total) by mouth 2 (two) times daily with a meal for 30 days. 08/18/18 11/04/19  Sherryll BurgerShah, Pratik D, DO  docusate sodium (COLACE) 100 MG capsule Take 1 capsule (100 mg total) by mouth 2 (two) times daily. Patient taking differently: Take 100 mg by mouth every morning.  06/25/16   Kathlen ModyAkula, Vijaya, MD  ferrous gluconate (FERGON) 324 MG tablet Take 1 tablet (324 mg total) by mouth 2 (two) times daily with a meal. Patient taking differently: Take 324 mg by mouth daily.  06/25/16   Kathlen ModyAkula, Vijaya, MD  fluticasone (FLOVENT HFA) 220 MCG/ACT inhaler Inhale 2 puffs into the lungs daily as needed (for shortness of breath).     [provider]  lisinopril (ZESTRIL) 10 MG tablet Take 10 mg by mouth daily. 11/22/18   [provider]  oxyCODONE-acetaminophen (PERCOCET/ROXICET) 5-325 MG tablet Take 1 tablet by mouth every 6 (six) hours as needed. 11/30/18   Bethann BerkshireZammit, Joseph, MD  OXYGEN Inhale 3.5 L into the lungs continuous.     [provider]  pantoprazole (PROTONIX) 40 MG tablet Take 1 tablet (40 mg total) by mouth daily at 6 (six) AM.  06/26/16   Hosie Poisson, MD  potassium chloride SA (K-DUR) 20 MEQ tablet Take 2 tablets (40 mEq total) by mouth 2 (two) times daily. 10/07/18   Barton Dubois, MD  rivaroxaban (XARELTO) 20 MG TABS tablet Take 1 tablet (20 mg total) by mouth daily with breakfast. Patient taking differently: Take 20 mg by mouth daily with supper.  06/26/16   Hosie Poisson, MD  silver sulfADIAZINE (SILVADENE) 1 % cream Apply 1 application topically daily. 11/28/18   Volanda Napoleon, PA-C  spironolactone (ALDACTONE) 100 MG tablet Take 100 mg by mouth daily. For fluid 03/18/17   [provider]  tamsulosin (FLOMAX) 0.4 MG CAPS capsule Take 1 capsule (0.4 mg total) by mouth daily. 06/25/16   Hosie Poisson, MD  potassium chloride 20 MEQ TBCR Take 20 mEq by mouth 2 (two) times daily. 09/03/15 11/04/18  Ward, Delice Bison, DO    Family History Family  History  Problem Relation Age of Onset  . Stroke Father   . Heart attack Father   . Aneurysm Mother        Cerebral aneurysm  . Hypertension Sister   . Colon cancer Neg Hx   . Inflammatory bowel disease Neg Hx   . Liver disease Neg Hx     Social History Social History   Tobacco Use  . Smoking status: Former Smoker    Packs/day: 0.50    Years: 20.00    Pack years: 10.00    Types: Cigarettes    Start date: 04/26/1988    Quit date: 06/23/2007    Years since quitting: 11.4  . Smokeless tobacco: Never Used  . Tobacco comment: 1 ppd former smoker  Substance Use Topics  . Alcohol use: Not Currently    Alcohol/week: 0.0 standard drinks    Comment: No etoh since 2009  . Drug use: No     Allergies   Banana, Bee venom, Strawberry flavor, Aspirin, Metolazone, Oatmeal, Orange juice [orange oil], Torsemide, Diltiazem, Hydralazine, and Lipitor [atorvastatin]   Review of Systems Review of Systems  HENT: Negative for congestion.   Respiratory: Positive for cough, chest tightness and shortness of breath.   Cardiovascular: Positive for chest pain and leg swelling.  Gastrointestinal: Negative for vomiting.  Genitourinary: Negative for dysuria and hematuria.  Musculoskeletal: Negative for myalgias.  Skin: Positive for wound.  Neurological: Negative for weakness and headaches.    all other systems are negative except as noted in the HPI and PMH.    Physical Exam Updated Vital Signs BP (!) 145/94   Pulse 82   Temp 97.8 F (36.6 C) (Oral)   Resp (!) 25   Ht 6' (1.829 m)   Wt (!) 171.9 kg   SpO2 95%   BMI 51.40 kg/m   Physical Exam Vitals signs and nursing note reviewed.  Constitutional:      General: He is not in acute distress.    Appearance: He is well-developed. He is obese.     Comments: Dyspneic with conversation,  HENT:     Head: Normocephalic and atraumatic.     Mouth/Throat:     Pharynx: No oropharyngeal exudate.  Eyes:     Conjunctiva/sclera: Conjunctivae  normal.     Pupils: Pupils are equal, round, and reactive to light.  Neck:     Musculoskeletal: Normal range of motion and neck supple.     Comments: No meningismus. Cardiovascular:     Rate and Rhythm: Normal rate and regular rhythm.     Heart sounds: Normal  heart sounds. No murmur.  Pulmonary:     Effort: Pulmonary effort is normal. No respiratory distress.     Breath sounds: Rales present.  Abdominal:     Palpations: Abdomen is soft.     Tenderness: There is no abdominal tenderness. There is no guarding or rebound.  Musculoskeletal: Normal range of motion.        General: No tenderness.     Right lower leg: Edema present.     Left lower leg: Edema present.     Comments: Brawny edema of legs bilaterally, right greater than left.  Intact DP and PT pulses.  Hyperkeratotic skin changes on right with clear drainage  Skin:    General: Skin is warm.     Capillary Refill: Capillary refill takes less than 2 seconds.  Neurological:     General: No focal deficit present.     Mental Status: He is alert and oriented to person, place, and time. Mental status is at baseline.     Cranial Nerves: No cranial nerve deficit.     Motor: No abnormal muscle tone.     Coordination: Coordination normal.     Comments:  5/5 strength throughout. CN 2-12 intact.Equal grip strength.   Psychiatric:        Behavior: Behavior normal.      ED Treatments / Results  Labs (all labs ordered are listed, but only abnormal results are displayed) Labs Reviewed  CBC WITH DIFFERENTIAL/PLATELET - Abnormal; Notable for the following components:      Result Value   Hemoglobin 12.6 (*)    All other components within normal limits  COMPREHENSIVE METABOLIC PANEL - Abnormal; Notable for the following components:   Calcium 8.7 (*)    All other components within normal limits  BRAIN NATRIURETIC PEPTIDE - Abnormal; Notable for the following components:   B Natriuretic Peptide 224.0 (*)    All other components within  normal limits  CULTURE, BLOOD (ROUTINE X 2)  CULTURE, BLOOD (ROUTINE X 2)  NOVEL CORONAVIRUS, NAA (HOSPITAL ORDER, SEND-OUT TO REF LAB)  TROPONIN I  LACTIC ACID, PLASMA  LACTIC ACID, PLASMA    EKG EKG Interpretation  Date/Time:  Thursday December 08 2018 00:06:21 EDT Ventricular Rate:  91 PR Interval:    QRS Duration: 89 QT Interval:  390 QTC Calculation: 480 R Axis:   48 Text Interpretation:  Atrial fibrillation Ventricular bigeminy Borderline prolonged QT interval No significant change was found Confirmed by Glynn Octaveancour, Koki Buxton [phone]([id number]) on 12/08/2018 3:18:20 AM   Radiology Dg Chest Portable 1 View  Result Date: 12/08/2018 CLINICAL DATA:  Shortness of breath. EXAM: PORTABLE CHEST 1 VIEW COMPARISON:  Multiple prior exams most recently 3 days prior 12/05/2018. FINDINGS: Cardiomegaly is unchanged. Unchanged mediastinal contours. Vascular congestion. No focal airspace disease. No large pleural effusion. No pneumothorax. Soft tissue attenuation from habitus limits assessment. IMPRESSION: Unchanged cardiomegaly with vascular congestion. Electronically Signed   By: Narda RutherfordMelanie  Sanford M.D.   On: 12/08/2018 01:02    Procedures Procedures (including critical care time)  Medications Ordered in ED Medications  furosemide (LASIX) injection 40 mg (has no administration in time range)     Initial Impression / Assessment and Plan / ED Course  I have reviewed the triage vital signs and the nursing notes.  Pertinent labs & imaging results that were available during my care of the patient were reviewed by me and considered in my medical decision making (see chart for details).       Patient well-known to the  ED presenting with difficulty breathing and leg swelling.  History of CHF and noncompliance.  Tachypneic but not hypoxic on home oxygen.  Weeping draining leg wound.  Patient given IV Lasix.  Chest x-ray shows edema.  Labs are reassuring.  No fever.  Empiric antibiotics given for possible  cellulitis of right leg.  CT scan obtained given the degree of swelling to rule out deep space infection.  This is negative.  Patient still with tachypnea and would benefit from increased diuresis.  Admission for continued IV diuresis discussed with Dr. Selena BattenKim.  Patient in agreement. Final Clinical Impressions(s) / ED Diagnoses   Final diagnoses:  Acute on chronic combined systolic and diastolic congestive heart failure [facility](HCC)    ED Discharge Orders    None       Glynn Octaveancour, Brynlyn Dade, MD 12/08/18 0502

## 2018-12-08 NOTE — Progress Notes (Signed)
[name] OF CARE NOTE Patient: [name] EHU:[id number]   PCP: Samuel Gravel, MD DOB: [birth year]   DOA: 12/07/2018   DOS: 12/08/2018    Patient was admitted by my colleague Dr. Maudie Fitzpatrick earlier on 12/08/2018. I have reviewed the H&P as well as assessment and plan and agree with the same. Important changes in the plan are listed below.  Plan of care: Principal Problem:   Cellulitis Active Problems:   Essential hypertension   Acute CHF (congestive heart failure) (HCC) Continue diuresis. Suspect the cellulitis is actually secondary to skin breakdown from severe edema. No open areas no purulent discharge. Monitor overnight and can likely be discharged tomorrow home on oral antibiotics.  Author: Berle Mull, MD [facility] 12/08/2018 5:52 PM   If 7PM-7AM, please contact night-coverage at [email or web address]

## 2018-12-08 NOTE — ED Notes (Signed)
Hospitalist, MD, Kim at bedside

## 2018-12-08 NOTE — ED Triage Notes (Signed)
Pt arrived from home via [facility] c/o SOB since yesterday and right lower leg pain. Pt presents with visible weeping from lower extremities.

## 2018-12-08 NOTE — Progress Notes (Signed)
*  PRELIMINARY RESULTS* Echocardiogram 2D Echocardiogram has been performed with [facility].  Samuel Germany 12/08/2018, 4:49 PM

## 2018-12-08 NOTE — Progress Notes (Signed)
ANTIBIOTIC CONSULT NOTE-Preliminary  Pharmacy Consult for vancomycin Indication: cellulitis  Allergies  Allergen Reactions  . Banana Shortness Of Breath  . Bee Venom Shortness Of Breath, Swelling and Other (See Comments)    Reaction:  Facial swelling  . Strawberry Flavor Shortness Of Breath and Rash  . Aspirin Other (See Comments)    Reaction:  GI upset   . Metolazone Other (See Comments)    Pt states that he stopped taking this med due to heart attack like symptoms.   . Oatmeal Nausea And Vomiting  . Orange Juice [Orange Oil] Nausea And Vomiting    All acidic products make him nauseous and upset stomach  . Torsemide Swelling and Other (See Comments)    Reaction:  Swelling of feet/legs   . Diltiazem Palpitations  . Hydralazine Palpitations  . Lipitor [Atorvastatin] Other (See Comments)    Reaction:  Nose bleeds : Patient is currently taking this medication    Patient Measurements: Height: 6' (182.9 cm) Weight: (!) 379 lb (171.9 kg) IBW/kg (Calculated) : 77.6 Adjusted Body Weight:   Vital Signs: Temp: 97.8 F (36.6 C) (06/18 0010) Temp Source: Oral (06/18 0010) BP: 143/95 (06/18 0320) Pulse Rate: 77 (06/18 0320)  Labs: Recent Labs    12/08/18 0022  WBC 4.3  HGB 12.6*  PLT 223  CREATININE 0.98    Estimated Creatinine Clearance: 147.1 mL/min (by C-G formula based on SCr of 0.98 mg/dL).  No results for input(s): VANCOTROUGH, VANCOPEAK, VANCORANDOM, GENTTROUGH, GENTPEAK, GENTRANDOM, TOBRATROUGH, TOBRAPEAK, TOBRARND, AMIKACINPEAK, AMIKACINTROU, AMIKACIN in the last 72 hours.   Microbiology: Recent Results (from the past 720 hour(s))  SARS Coronavirus 2 (CEPHEID - Performed in [place] hospital lab), Hosp Order     Status: None   Collection Time: 11/09/18  7:33 AM   Specimen: Nasopharyngeal Swab  Result Value Ref Range Status   SARS Coronavirus 2 NEGATIVE NEGATIVE Final    Comment: (NOTE) If result is NEGATIVE SARS-CoV-2 target nucleic acids are NOT  DETECTED. The SARS-CoV-2 RNA is generally detectable in upper and lower  respiratory specimens during the acute phase of infection. The lowest  concentration of SARS-CoV-2 viral copies this assay can detect is 250  copies / mL. A negative result does not preclude SARS-CoV-2 infection  and should not be used as the sole basis for treatment or other  patient management decisions.  A negative result may occur with  improper specimen collection / handling, submission of specimen other  than nasopharyngeal swab, presence of viral mutation(s) within the  areas targeted by this assay, and inadequate number of viral copies  (<250 copies / mL). A negative result must be combined with clinical  observations, patient history, and epidemiological information. If result is POSITIVE SARS-CoV-2 target nucleic acids are DETECTED. The SARS-CoV-2 RNA is generally detectable in upper and lower  respiratory specimens dur ing the acute phase of infection.  Positive  results are indicative of active infection with SARS-CoV-2.  Clinical  correlation with patient history and other diagnostic information is  necessary to determine patient infection status.  Positive results do  not rule out bacterial infection or co-infection with other viruses. If result is PRESUMPTIVE POSTIVE SARS-CoV-2 nucleic acids MAY BE PRESENT.   A presumptive positive result was obtained on the submitted specimen  and confirmed on repeat testing.  While 2019 novel coronavirus  (SARS-CoV-2) nucleic acids may be present in the submitted sample  additional confirmatory testing may be necessary for epidemiological  and / or clinical management purposes  to differentiate between  SARS-CoV-2 and other Sarbecovirus currently known to infect humans.  If clinically indicated additional testing with an alternate test  methodology [phone]) is advised. The SARS-CoV-2 RNA is generally  detectable in upper and lower respiratory sp ecimens during  the acute  phase of infection. The expected result is Negative. Fact Sheet for Patients:  BoilerBrush.com.cy Fact Sheet for Healthcare Providers: https://pope.com/ This test is not yet approved or cleared by the [place] FDA and has been authorized for detection and/or diagnosis of SARS-CoV-2 by FDA under an Emergency Use Authorization (EUA).  This EUA will remain in effect (meaning this test can be used) for the duration of the COVID-19 declaration under Section 564(b)(1) of the Act, 21 U.S.C. section 360bbb-3(b)(1), unless the authorization is terminated or revoked sooner. Performed at [facility], [address], [place], [place] [address]   Culture, blood (routine x 2)     Status: None   Collection Time: 11/09/18  7:51 AM   Specimen: BLOOD  Result Value Ref Range Status   Specimen Description BLOOD LEFT ARM  Final   Special Requests   Final    BOTTLES DRAWN AEROBIC AND ANAEROBIC Blood Culture adequate volume   Culture   Final    NO GROWTH 5 DAYS Performed at [facility], [address] [place], [place] [address]    Report Status 11/14/2018 FINAL  Final  Culture, blood (routine x 2)     Status: None   Collection Time: 11/09/18  7:59 AM   Specimen: BLOOD  Result Value Ref Range Status   Specimen Description BLOOD RIGHT HAND  Final   Special Requests   Final    BOTTLES DRAWN AEROBIC AND ANAEROBIC Blood Culture adequate volume   Culture   Final    NO GROWTH 5 DAYS Performed at [facility], [address], [place], [place] [address]    Report Status 11/14/2018 FINAL  Final  MRSA PCR Screening     Status: None   Collection Time: 11/09/18  8:55 AM   Specimen: Nasopharyngeal  Result Value Ref Range Status   MRSA by PCR NEGATIVE NEGATIVE Final    Comment:        The GeneXpert MRSA Assay (FDA approved for NASAL specimens only), is one component of a comprehensive MRSA colonization surveillance program. It is not intended  to diagnose MRSA infection nor to guide or monitor treatment for MRSA infections. Performed at [facility], [address] [place], [place] [address]   SARS Coronavirus 2 (CEPHEID- Performed in [facility] hospital lab), Hosp Order     Status: None   Collection Time: 11/24/18  7:17 AM   Specimen: Nasopharyngeal Swab  Result Value Ref Range Status   SARS Coronavirus 2 NEGATIVE NEGATIVE Final    Comment: (NOTE) If result is NEGATIVE SARS-CoV-2 target nucleic acids are NOT DETECTED. The SARS-CoV-2 RNA is generally detectable in upper and lower  respiratory specimens during the acute phase of infection. The lowest  concentration of SARS-CoV-2 viral copies this assay can detect is 250  copies / mL. A negative result does not preclude SARS-CoV-2 infection  and should not be used as the sole basis for treatment or other  patient management decisions.  A negative result may occur with  improper specimen collection / handling, submission of specimen other  than nasopharyngeal swab, presence of viral mutation(s) within the  areas targeted by this assay, and inadequate number of viral copies  (<250 copies / mL). A negative result must  be combined with clinical  observations, patient history, and epidemiological information. If result is POSITIVE SARS-CoV-2 target nucleic acids are DETECTED. The SARS-CoV-2 RNA is generally detectable in upper and lower  respiratory specimens dur ing the acute phase of infection.  Positive  results are indicative of active infection with SARS-CoV-2.  Clinical  correlation with patient history and other diagnostic information is  necessary to determine patient infection status.  Positive results do  not rule out bacterial infection or co-infection with other viruses. If result is PRESUMPTIVE POSTIVE SARS-CoV-2 nucleic acids MAY BE PRESENT.   A presumptive positive result was obtained on the submitted specimen  and confirmed on repeat testing.  While 2019  novel coronavirus  (SARS-CoV-2) nucleic acids may be present in the submitted sample  additional confirmatory testing may be necessary for epidemiological  and / or clinical management purposes  to differentiate between  SARS-CoV-2 and other Sarbecovirus currently known to infect humans.  If clinically indicated additional testing with an alternate test  methodology [phone]([id number]) is advised. The SARS-CoV-2 RNA is generally  detectable in upper and lower respiratory sp ecimens during the acute  phase of infection. The expected result is Negative. Fact Sheet for Patients:  BoilerBrush.com.cyhttps://www.fda.gov/media/136312/download Fact Sheet for Healthcare Providers: https://pope.com/https://www.fda.gov/media/136313/download This test is not yet approved or cleared by the [place] FDA and has been authorized for detection and/or diagnosis of SARS-CoV-2 by FDA under an Emergency Use Authorization (EUA).  This EUA will remain in effect (meaning this test can be used) for the duration of the COVID-19 declaration under Section 564(b)(1) of the Act, 21 U.S.C. section 360bbb-3(b)(1), unless the authorization is terminated or revoked sooner. Performed at [facility], [address] [place], [place] [id number]     Medical History: Past Medical History:  Diagnosis Date  . Allergic rhinitis   . Asthma   . Atrial fibrillation (HCC)   . CHF (congestive heart failure) (HCC)   . Elevated troponin level not due myocardial infarction 06/13/2015   cath w/ nl cors, felt 2nd HTN, CHF, peat trop 2.73  . Essential hypertension   . Gastroesophageal reflux disease   . History of cardiac catheterization    Normal coronaries December 2016  . Noncompliance    Major problem leading to declining health and recurrent hospitalization  . Nonischemic cardiomyopathy (HCC)    a. LVEF 20-25% in 2017 with cath in 2016 showing normal cors. b. EF 55% by echo in 03/2018.  . On home O2    3L N/C  . OSA (obstructive sleep apnea)   .  Osteoarthritis   . Peptic ulcer disease     Medications:  Anti-infectives (From admission, onward)   Start     Dose/Rate Route Frequency Ordered Stop   12/08/18 0500  vancomycin (VANCOCIN) 1,500 mg in sodium chloride 0.9 % 500 mL IVPB  Status:  Discontinued     1,500 mg 250 mL/hr over 120 Minutes Intravenous  Once 12/08/18 0451 12/08/18 0455   12/08/18 0500  vancomycin (VANCOCIN) IVPB 1000 mg/200 mL premix  Status:  Discontinued     1,000 mg 200 mL/hr over 60 Minutes Intravenous  Once 12/08/18 0451 12/08/18 0455   12/08/18 0500  vancomycin (VANCOCIN) IVPB 1000 mg/200 mL premix     1,000 mg 200 mL/hr over 60 Minutes Intravenous Every 1 hr x 2 12/08/18 0455 12/08/18 0659      Assessment: 53 yo male presented with right lower leg pain with weeping from lower extremities.  Pharmacy has been consulted for  vancomycin dosing for cellulitis.   Goal of Therapy:  Vancomycin trough level 10-15 mcg/ml  Plan:  Preliminary review of pertinent patient information completed.  Protocol will be initiated with dose(s) of vancomycin 2 gram loading dose   Jeani HawkingAnnie Penn clinical pharmacist will complete review during morning rounds to assess patient and finalize treatment regimen if needed.  Jamare Vanatta, Berneice Heinrichiffany Scarlett, RPH 12/08/2018,4:56 AM

## 2018-12-08 NOTE — Progress Notes (Signed)
Pharmacy Antibiotic Note  [name] is a 53 y.o. male admitted on 12/07/2018 with cellulitis.  Pharmacy has been consulted for Vancomycin dosing.  Plan: Vancomycin 1750 mg IV every 12 hours.  Goal trough 10-15 mcg/mL. Ceftriaxone 2000 mg IV every 24 hours. Monitor labs, c/s, and vanco levels as indicated.  Height: 6' (182.9 cm) Weight: (!) 371 lb 4.1 oz (168.4 kg) IBW/kg (Calculated) : 77.6  Temp (24hrs), Avg:97.8 F (36.6 C), Min:97.8 F (36.6 C), Max:97.8 F (36.6 C)  Recent Labs  Lab 12/08/18 0022 12/08/18 0307 12/08/18 0559  WBC 4.3  --   --   CREATININE 0.98  --   --   LATICACIDVEN  --  0.7 0.8    Estimated Creatinine Clearance: 145.3 mL/min (by C-G formula based on SCr of 0.98 mg/dL).    Allergies  Allergen Reactions  . Banana Shortness Of Breath  . Bee Venom Shortness Of Breath, Swelling and Other (See Comments)    Reaction:  Facial swelling  . Strawberry Flavor Shortness Of Breath and Rash  . Aspirin Other (See Comments)    Reaction:  GI upset   . Metolazone Other (See Comments)    Pt states that he stopped taking this med due to heart attack like symptoms.   . Oatmeal Nausea And Vomiting  . Orange Juice [Orange Oil] Nausea And Vomiting    All acidic products make him nauseous and upset stomach  . Torsemide Swelling and Other (See Comments)    Reaction:  Swelling of feet/legs   . Diltiazem Palpitations  . Hydralazine Palpitations  . Lipitor [Atorvastatin] Other (See Comments)    Reaction:  Nose bleeds : Patient is currently taking this medication    Antimicrobials this admission: Vacno 6/18 >>  Rocephin 6/18 >>  Dose adjustments this admission: N/A  Microbiology results: 6/18 BCx: ngtd   Thank you for allowing pharmacy to be a part of this patient's care.  Ramond Craver 12/08/2018 11:49 AM

## 2018-12-08 NOTE — H&P (Signed)
TRH H&P    Patient Demographics:    [name], is a 53 y.o. male  MRN: [id number]  DOB - 04-Jul-1965  Admit Date - 12/07/2018  Referring MD/NP/PA:  Ezequiel Essex  Outpatient Primary MD for the patient is Jani Gravel, MD  Patient coming from:  home  Chief complaint-  Drainage from right leg   HPI:    [name]  is a 53 y.o. male, w chronic combined systolic and diastolic CHF with recurrent readmissions due to dietary medication noncompliance, atrial fibrillation on Xarelto, hypertension, MI, asthma, OSA noncompliant with CPAP and morbid obesity who presents due to yellow drainage from the back of his right mid calf area as well as near ankle.  Pt states this has been going on for about 1 week.  Pt notes that his right leg has been slightly tender where the drainage is coming from .  Pt notes his dyspnea might be slightly worse than normal .  Pt denies fever, chills, cough, cp, palp, n/v, abd pain, diarrhea, brbpr.    In ED,  T 97.8, P 106  R 24 Bp 181/116  Pox 93%  Wt 171.9kg  Wbc 4.3, Hgb 12.6, Plt 223,  Na 140, K 4.0 Bun 15, Creatinine 0.98  Trop <0.03 Glucose 97 Lactic acid 0.8  CT tibia fibula IMPRESSION: No acute bony abnormality.  Diffuse generalized edema throughout the right lower extremity. Prominent varicose veins most noted medially within the right calf.   CXR IMPRESSION: Unchanged cardiomegaly with vascular congestion.  Pt tx with vanco iv in ED, and given lasix 40mg  iv x1 in ED.   Pt will be admitted for drainage from right leg, ? Cellulitis.  As well as mild acute on chronic CHF.     Review of systems:    In addition to the HPI above,  No Fever-chills, No Headache, No changes with Vision or hearing, No problems swallowing food or Liquids, No Chest pain, Cough  No Abdominal pain, No Nausea or Vomiting, bowel movements are regular, No Blood in stool or Urine, No  dysuria,  No new joints pains-aches,  No new weakness, tingling, numbness in any extremity, No recent weight gain or loss, No polyuria, polydypsia or polyphagia, No significant Mental Stressors.  All other systems reviewed and are negative.    Past History of the following :    Past Medical History:  Diagnosis Date  . Allergic rhinitis   . Asthma   . Atrial fibrillation ([place])   . CHF (congestive heart failure) ([place])   . Elevated troponin level not due myocardial infarction 06/13/2015   cath w/ nl cors, felt 2nd HTN, CHF, peat trop 2.73  . Essential hypertension   . Gastroesophageal reflux disease   . History of cardiac catheterization    Normal coronaries December 2016  . Noncompliance    Major problem leading to declining health and recurrent hospitalization  . Nonischemic cardiomyopathy ([place])    a. LVEF 20-25% in 2017 with cath in 2016 showing normal cors. b. EF 55% by echo in 03/2018.  Marland Kitchen  On home O2    3L N/C  . OSA (obstructive sleep apnea)   . Osteoarthritis   . Peptic ulcer disease       Past Surgical History:  Procedure Laterality Date  . CARDIAC CATHETERIZATION N/A 06/14/2015   Procedure: Right/Left Heart Cath and Coronary Angiography;  Surgeon: Runell GessJonathan J Berry, MD;  [facility];  Service: Cardiovascular;  Laterality: N/A;      Social History:      Social History   Tobacco Use  . Smoking status: Former Smoker    Packs/day: 0.50    Years: 20.00    Pack years: 10.00    Types: Cigarettes    Start date: 04/26/1988    Quit date: 06/23/2007    Years since quitting: 11.4  . Smokeless tobacco: Never Used  . Tobacco comment: 1 ppd former smoker  Substance Use Topics  . Alcohol use: Not Currently    Alcohol/week: 0.0 standard drinks    Comment: No etoh since 2009       Family History :     Family History  Problem Relation Age of Onset  . Stroke Father   . Heart attack Father   . Aneurysm Mother        Cerebral aneurysm  .  Hypertension Sister   . Colon cancer Neg Hx   . Inflammatory bowel disease Neg Hx   . Liver disease Neg Hx        Home Medications:   Prior to Admission medications   Medication Sig Start Date End Date Taking? Authorizing Provider  acetaminophen (TYLENOL) 325 MG tablet Take 2 tablets (650 mg total) by mouth every 4 (four) hours as needed for headache or mild pain. 11/02/18   Calvert Cantorizwan, Saima, MD  albuterol (PROVENTIL) (2.5 MG/3ML) 0.083% nebulizer solution Take 2.5 mg by nebulization every 6 (six) hours as needed for wheezing or shortness of breath.    [provider]  atorvastatin (LIPITOR) 40 MG tablet Take 40 mg by mouth daily.  09/05/18   [provider]  bumetanide (BUMEX) 2 MG tablet Take 1 tablet (2 mg total) by mouth 3 (three) times daily for 30 days. 11/12/18 12/12/18  Sherryll BurgerShah, Pratik D, DO  carvedilol (COREG) 25 MG tablet Take 1 tablet (25 mg total) by mouth 2 (two) times daily with a meal for 30 days. 08/18/18 11/04/19  Sherryll BurgerShah, Pratik D, DO  docusate sodium (COLACE) 100 MG capsule Take 1 capsule (100 mg total) by mouth 2 (two) times daily. Patient taking differently: Take 100 mg by mouth every morning.  06/25/16   Kathlen ModyAkula, Vijaya, MD  ferrous gluconate (FERGON) 324 MG tablet Take 1 tablet (324 mg total) by mouth 2 (two) times daily with a meal. Patient taking differently: Take 324 mg by mouth daily.  06/25/16   Kathlen ModyAkula, Vijaya, MD  fluticasone (FLOVENT HFA) 220 MCG/ACT inhaler Inhale 2 puffs into the lungs daily as needed (for shortness of breath).     [provider]  lisinopril (ZESTRIL) 10 MG tablet Take 10 mg by mouth daily. 11/22/18   [provider]  oxyCODONE-acetaminophen (PERCOCET/ROXICET) 5-325 MG tablet Take 1 tablet by mouth every 6 (six) hours as needed. 11/30/18   Bethann BerkshireZammit, Joseph, MD  OXYGEN Inhale 3.5 L into the lungs continuous.     [provider]  pantoprazole (PROTONIX) 40 MG tablet Take 1 tablet (40 mg total) by mouth daily at 6 (six) AM.  06/26/16   Kathlen ModyAkula, Vijaya, MD  potassium chloride SA (K-DUR)  20 MEQ tablet Take 2 tablets (40 mEq total) by mouth 2 (two) times daily. 10/07/18   Vassie LollMadera, Carlos, MD  rivaroxaban (XARELTO) 20 MG TABS tablet Take 1 tablet (20 mg total) by mouth daily with breakfast. Patient taking differently: Take 20 mg by mouth daily with supper.  06/26/16   Kathlen ModyAkula, Vijaya, MD  silver sulfADIAZINE (SILVADENE) 1 % cream Apply 1 application topically daily. 11/28/18   Maxwell CaulLayden, Lindsey A, PA-C  spironolactone (ALDACTONE) 100 MG tablet Take 100 mg by mouth daily. For fluid 03/18/17   [provider]  tamsulosin (FLOMAX) 0.4 MG CAPS capsule Take 1 capsule (0.4 mg total) by mouth daily. 06/25/16   Kathlen ModyAkula, Vijaya, MD  potassium chloride 20 MEQ TBCR Take 20 mEq by mouth 2 (two) times daily. 09/03/15 11/04/18  Ward, Layla MawKristen N, DO     Allergies:     Allergies  Allergen Reactions  . Banana Shortness Of Breath  . Bee Venom Shortness Of Breath, Swelling and Other (See Comments)    Reaction:  Facial swelling  . Strawberry Flavor Shortness Of Breath and Rash  . Aspirin Other (See Comments)    Reaction:  GI upset   . Metolazone Other (See Comments)    Pt states that he stopped taking this med due to heart attack like symptoms.   . Oatmeal Nausea And Vomiting  . Orange Juice [Orange Oil] Nausea And Vomiting    All acidic products make him nauseous and upset stomach  . Torsemide Swelling and Other (See Comments)    Reaction:  Swelling of feet/legs   . Diltiazem Palpitations  . Hydralazine Palpitations  . Lipitor [Atorvastatin] Other (See Comments)    Reaction:  Nose bleeds : Patient is currently taking this medication     Physical Exam:   Vitals  Blood pressure (!) 129/93, pulse (!) 106, temperature 97.8 F (36.6 C), temperature source Oral, resp. rate (!) 24, height 6' (1.829 m), weight (!) 171.9 kg, SpO2 93 %.  1.  General: axox3  2. Psychiatric: euthymic  3. Neurologic: cn2-12 intact, reflexes 2+ symmetric,  diffuse with no clonus, motor 5/5 in all 4 ext  4. HEENMT:  Anicteric, pupils 1.305mm symmetric, direct, consensual intact Neck: slight jvd  5. Respiratory : Slight bibasilar crackles, no wheezing  6. Cardiovascular : Irr, irr, s1, s2,   7. Gastrointestinal:  Abd: soft, nt, +bs  8. Skin:  Ext: no c/c,  1+ edema  + lymphedema right distal lower ext  9.Musculoskeletal:  Good ROM  No adenoapthy    Data Review:    CBC Recent Labs  Lab 12/08/18 0022  WBC 4.3  HGB 12.6*  HCT 39.4  PLT 223  MCV 88.1  MCH 28.2  MCHC 32.0  RDW 14.6  LYMPHSABS 0.9  MONOABS 0.5  EOSABS 0.2  BASOSABS 0.0   ------------------------------------------------------------------------------------------------------------------  Results for orders placed or performed during the hospital encounter of 12/07/18 (from the past 48 hour(s))  CBC with Differential/Platelet     Status: Abnormal   Collection Time: 12/08/18 12:22 AM  Result Value Ref Range   WBC 4.3 4.0 - 10.5 K/uL   RBC 4.47 4.22 - 5.81 MIL/uL   Hemoglobin 12.6 (L) 13.0 - 17.0 g/dL   HCT 16.139.4 09.639.0 - 04.552.0 %   MCV 88.1 80.0 - 100.0 fL   MCH 28.2 26.0 - 34.0 pg   MCHC 32.0 30.0 - 36.0 g/dL   RDW 40.914.6 81.111.5 - 91.415.5 %   Platelets 223 150 - 400 K/uL  nRBC 0.0 0.0 - 0.2 %   Neutrophils Relative % 61 %   Neutro Abs 2.7 1.7 - 7.7 K/uL   Lymphocytes Relative 22 %   Lymphs Abs 0.9 0.7 - 4.0 K/uL   Monocytes Relative 12 %   Monocytes Absolute 0.5 0.1 - 1.0 K/uL   Eosinophils Relative 4 %   Eosinophils Absolute 0.2 0.0 - 0.5 K/uL   Basophils Relative 1 %   Basophils Absolute 0.0 0.0 - 0.1 K/uL   Immature Granulocytes 0 %   Abs Immature Granulocytes 0.01 0.00 - 0.07 K/uL    Comment: Performed at [facility], [address] [place], [place] [address]  Comprehensive metabolic panel     Status: Abnormal   Collection Time: 12/08/18 12:22 AM  Result Value Ref Range   Sodium 140 135 - 145 mmol/L   Potassium 4.0 3.5 - 5.1 mmol/L   Chloride  106 98 - 111 mmol/L   CO2 24 22 - 32 mmol/L   Glucose, Bld 97 70 - 99 mg/dL   BUN 15 6 - 20 mg/dL   Creatinine, Ser 3.73 0.61 - 1.24 mg/dL   Calcium 8.7 (L) 8.9 - 10.3 mg/dL   Total Protein 7.4 6.5 - 8.1 g/dL   Albumin 3.6 3.5 - 5.0 g/dL   AST 18 15 - 41 U/L   ALT 16 0 - 44 U/L   Alkaline Phosphatase 75 38 - 126 U/L   Total Bilirubin 0.7 0.3 - 1.2 mg/dL   GFR calc non Af Amer >60 >60 mL/min   GFR calc Af Amer >60 >60 mL/min   Anion gap 10 5 - 15    Comment: Performed at [facility], [address] [place], [place] [address]  Troponin I - ONCE - STAT     Status: None   Collection Time: 12/08/18 12:22 AM  Result Value Ref Range   Troponin I <0.03 <0.03 ng/mL    Comment: Performed at [facility], [address] [place], [place] [address]  Brain natriuretic peptide     Status: Abnormal   Collection Time: 12/08/18 12:22 AM  Result Value Ref Range   B Natriuretic Peptide 224.0 (H) 0.0 - 100.0 pg/mL    Comment: Performed at [facility], [address], [place], [place] [address]  Blood culture (routine x 2)     Status: None (Preliminary result)   Collection Time: 12/08/18  3:07 AM   Specimen: Right Antecubital; Blood  Result Value Ref Range   Specimen Description RIGHT ANTECUBITAL    Special Requests      BOTTLES DRAWN AEROBIC AND ANAEROBIC Blood Culture adequate volume Performed at [facility], [address], [place], [place] [address]    Culture PENDING    Report Status PENDING   Lactic acid, plasma     Status: None   Collection Time: 12/08/18  3:07 AM  Result Value Ref Range   Lactic Acid, Venous 0.7 0.5 - 1.9 mmol/L    Comment: Performed at [facility], [address], [place], [place] [address]  Blood culture (routine x 2)     Status: None (Preliminary result)   Collection Time: 12/08/18  3:15 AM   Specimen: Left Antecubital; Blood  Result Value Ref Range   Specimen Description LEFT ANTECUBITAL    Special Requests      BOTTLES DRAWN AEROBIC AND ANAEROBIC Blood Culture  adequate volume Performed at [facility], [address], [place], [place] [address]    Culture PENDING    Report Status  PENDING   Lactic acid, plasma     Status: None   Collection Time: 12/08/18  5:59 AM  Result Value Ref Range   Lactic Acid, Venous 0.8 0.5 - 1.9 mmol/L    Comment: Performed at [facility], [address], [place], [place] [address]  Troponin I - Now Then Q6H     Status: None   Collection Time: 12/08/18  5:59 AM  Result Value Ref Range   Troponin I <0.03 <0.03 ng/mL    Comment: Performed at [facility], [address] [place], [place] [address]    Chemistries  Recent Labs  Lab 12/08/18 0022  NA 140  K 4.0  CL 106  CO2 24  GLUCOSE 97  BUN 15  CREATININE 0.98  CALCIUM 8.7*  AST 18  ALT 16  ALKPHOS 75  BILITOT 0.7   ------------------------------------------------------------------------------------------------------------------  ------------------------------------------------------------------------------------------------------------------ GFR: Estimated Creatinine Clearance: 147.1 mL/min (by C-G formula based on SCr of 0.98 mg/dL). Liver Function Tests: Recent Labs  Lab 12/08/18 0022  AST 18  ALT 16  ALKPHOS 75  BILITOT 0.7  PROT 7.4  ALBUMIN 3.6   No results for input(s): LIPASE, AMYLASE in the last 168 hours. No results for input(s): AMMONIA in the last 168 hours. Coagulation Profile: No results for input(s): INR, PROTIME in the last 168 hours. Cardiac Enzymes: Recent Labs  Lab 12/08/18 0022 12/08/18 0559  TROPONINI <0.03 <0.03   BNP (last 3 results) No results for input(s): PROBNP in the last 8760 hours. HbA1C: No results for input(s): HGBA1C in the last 72 hours. CBG: No results for input(s): GLUCAP in the last 168 hours. Lipid Profile: No results for input(s): CHOL, HDL, LDLCALC, TRIG, CHOLHDL, LDLDIRECT in the last 72 hours. Thyroid Function Tests: No results for input(s): TSH, T4TOTAL, FREET4, T3FREE, THYROIDAB in the  last 72 hours. Anemia Panel: No results for input(s): VITAMINB12, FOLATE, FERRITIN, TIBC, IRON, RETICCTPCT in the last 72 hours.  --------------------------------------------------------------------------------------------------------------- Urine analysis:    Component Value Date/Time   COLORURINE YELLOW 04/01/2018 0332   APPEARANCEUR CLEAR 04/01/2018 0332   LABSPEC 1.008 04/01/2018 0332   PHURINE 7.0 04/01/2018 0332   GLUCOSEU NEGATIVE 04/01/2018 0332   GLUCOSEU NEG mg/dL 82/95/6213 0865   HGBUR NEGATIVE 04/01/2018 0332   BILIRUBINUR NEGATIVE 04/01/2018 0332   KETONESUR NEGATIVE 04/01/2018 0332   PROTEINUR NEGATIVE 04/01/2018 0332   UROBILINOGEN 0.2 08/08/2014 1445   NITRITE NEGATIVE 04/01/2018 0332   LEUKOCYTESUR NEGATIVE 04/01/2018 0332      Imaging Results:    Ct Tibia Fibula Right W Contrast  Result Date: 12/08/2018 CLINICAL DATA:  Right lower leg pain, generalized edema EXAM: CT OF THE LOWER RIGHT EXTREMITY WITH CONTRAST TECHNIQUE: Multidetector CT imaging of the lower right extremity was performed according to the standard protocol following intravenous contrast administration. COMPARISON:  Plain films 12/05/2018 CONTRAST:  OMNIPAQUE IOHEXOL 300 MG/ML  SOLN FINDINGS: Diffuse generalized edema throughout the right lower extremity. Dilated varicose veins noted throughout the right lower extremity. No focal fluid collection. No acute bony abnormality. No bone destruction to suggest osteomyelitis. No fracture, subluxation or dislocation. IMPRESSION: No acute bony abnormality. Diffuse generalized edema throughout the right lower extremity. Prominent varicose veins most noted medially within the right calf. Electronically Signed   By: Charlett Nose M.D.   On: 12/08/2018 03:52   Dg Chest Portable 1 View  Result Date: 12/08/2018 CLINICAL DATA:  Shortness of breath. EXAM: PORTABLE CHEST 1 VIEW COMPARISON:  Multiple prior exams most recently 3 days prior 12/05/2018.  FINDINGS:  Cardiomegaly is unchanged. Unchanged mediastinal contours. Vascular congestion. No focal airspace disease. No large pleural effusion. No pneumothorax. Soft tissue attenuation from habitus limits assessment. IMPRESSION: Unchanged cardiomegaly with vascular congestion. Electronically Signed   By: Narda RutherfordMelanie  Sanford M.D.   On: 12/08/2018 01:02       Assessment & Plan:    Principal Problem:   Cellulitis Active Problems:   Essential hypertension   Acute CHF (congestive heart failure) (HCC)  Cellulitis Blood culture x2 vanco iv x1 given in ED,  rocephin 2gm iv qday Wound care consult, appreciate input  Mild Acute on Chronic CHF (systolic and diastolic) Tele Trop I q6h x3 Check cardiac echo Lasix 40mg  iv x1 in ED Start Lasix 40mg  iv bid (in placed of bumex) Cont Kdur 40meq po qday Cont Spironolactone 100mg  po qday Cont Lisinopril 10mg  po qday Cont carvedilol 25mg  po bid Check cmp in am  Pafib Cont carvedilol as above Cont Xarelto  Hyperlipidemia Cont Lipitor 40mg  po qhs  Gerd Cont PPI  Anemia Check cbc in am  Bph Cont flomax 0.4mg  po qhs   DVT Prophylaxis-   Xarelto- SCDs   AM Labs Ordered, also please review Full Orders  Family Communication: Admission, patients condition and plan of care including tests being ordered have been discussed with the patient  who indicate understanding and agree with the plan and Code Status.  Code Status:  FULL CODE  Admission status: Observation: Based on patients clinical presentation and evaluation of above clinical data, I have made determination that patient meets observation criteria at this time. Pt will require iv abx for celluitis, w purulent drainage from the right distal lower ext.  Pt will also require iv lasix for diuresis for mild acute on chronic chf.  Depending upon how the patient progresses might require change to inpatient admission   Time spent in minutes : 70   Pearson GrippeJames Delbert Darley M.D on 12/08/2018 at 6:32 AM

## 2018-12-08 NOTE — Consult Note (Addendum)
[place] Nurse wound consult note WOC consult performed remotely after review of the progress notes.  Pt is reported to have cellulitis with mod amt yellow drainage to right posterior leg and ankle.  Reason for Consult: Consult requested for topical treatment recommendations. Pt is noted to have generalized lymphedema, no open wound is reported.  Dressing procedure/placement/frequency: Silvadene has already been ordered by a physician. Bedside nurse to apply Silvadene daily to promote drying and healing, with  ABD pads and kerlex to absorb drainage. Ace wrap to provide light compressions.  Please re-consult if further assistance is needed.  Thank-you,  Julien Girt MSN, [place], [place], [place], [place]

## 2018-12-09 ENCOUNTER — Ambulatory Visit: Payer: Self-pay | Admitting: Pharmacist

## 2018-12-09 ENCOUNTER — Ambulatory Visit: Payer: Self-pay

## 2018-12-09 DIAGNOSIS — L03115 Cellulitis of right lower limb: Secondary | ICD-10-CM | POA: Diagnosis not present

## 2018-12-09 LAB — CBC
HCT: 38.8 % — ABNORMAL LOW (ref 39.0–52.0)
Hemoglobin: 12 g/dL — ABNORMAL LOW (ref 13.0–17.0)
MCH: 27.5 pg (ref 26.0–34.0)
MCHC: 30.9 g/dL (ref 30.0–36.0)
MCV: 88.8 fL (ref 80.0–100.0)
Platelets: 241 10*3/uL (ref 150–400)
RBC: 4.37 MIL/uL (ref 4.22–5.81)
RDW: 14.6 % (ref 11.5–15.5)
WBC: 4.4 10*3/uL (ref 4.0–10.5)
nRBC: 0 % (ref 0.0–0.2)

## 2018-12-09 LAB — COMPREHENSIVE METABOLIC PANEL
ALT: 13 U/L (ref 0–44)
AST: 17 U/L (ref 15–41)
Albumin: 3.3 g/dL — ABNORMAL LOW (ref 3.5–5.0)
Alkaline Phosphatase: 46 U/L (ref 38–126)
Anion gap: 8 (ref 5–15)
BUN: 14 mg/dL (ref 6–20)
CO2: 26 mmol/L (ref 22–32)
Calcium: 8.5 mg/dL — ABNORMAL LOW (ref 8.9–10.3)
Chloride: 107 mmol/L (ref 98–111)
Creatinine, Ser: 1.06 mg/dL (ref 0.61–1.24)
GFR calc Af Amer: >60 mL/min (ref >60)
GFR calc non Af Amer: >60 mL/min (ref >60)
Glucose, Bld: 90 mg/dL (ref 70–99)
Potassium: 3.9 mmol/L (ref 3.5–5.1)
Sodium: 141 mmol/L (ref 135–145)
Total Bilirubin: 1.1 mg/dL (ref 0.3–1.2)
Total Protein: 6.7 g/dL (ref 6.5–8.1)

## 2018-12-09 LAB — ECHOCARDIOGRAM COMPLETE
Height: 72 in
Weight: 5940.07 oz

## 2018-12-09 MED ORDER — BUMETANIDE 2 MG PO TABS: 2.0000 mg | ORAL_TABLET | Freq: Three times a day (TID) | ORAL | 0 refills | Status: DC

## 2018-12-09 MED ORDER — DOXYCYCLINE HYCLATE 100 MG PO TABS: 100.0000 mg | ORAL_TABLET | Freq: Two times a day (BID) | ORAL | 0 refills | Status: AC

## 2018-12-09 MED ORDER — DOXYCYCLINE HYCLATE 100 MG PO TABS: 100.0000 mg | ORAL_TABLET | Freq: Two times a day (BID) | ORAL | Status: DC

## 2018-12-09 NOTE — TOC Transition Note (Signed)
Transition of Care [facility]) - CM/SW Discharge Note   Patient Details  Name: [name] MRN: [id number] Date of Birth: 17-Feb-1966  [facility] CM/SW Contact:  Ramirez Fullbright Dimitri Ped, LCSW Phone Number: 12/09/2018, 4:27 PM   Clinical Narrative:   CSW [facility] contact with [facility] to arrange resumption of services. Pt is to discharge on today 12/09/2018.    Final next level of care: [place] Barriers to Discharge: No Barriers Identified   Patient Goals and CMS Choice Patient states their goals for this hospitalization and ongoing recovery are:: to return home CMS Medicare.gov Compare Post Acute Care list provided to:: Patient Choice offered to / list presented to : Patient  Discharge Placement                       Discharge Plan and Services                DME Arranged: N/A DME Agency: NA       HH Arranged: RN Media Agency: [facility] [place] Date [place]: 12/09/18 Time [place]: 1627 Representative spoke with at [place]: [place] ([place]) Interventions     Readmission Risk Interventions Readmission Risk Prevention Plan 11/26/2018 11/25/2018 11/11/2018  Transportation Screening Complete Complete Complete  Medication Review Press photographer) Complete Complete Complete  PCP or Specialist appointment within 3-5 days of discharge Not Complete Not Complete -  [place] or Home Care Consult Complete Complete Not Complete  HRI or Home Care Consult Pt Refusal Comments - - not ordered, no [facility] will accept pt any longer  SW Recovery Care/Counseling Consult Complete Complete Complete  Palliative Care Screening Not Applicable Not Applicable Not [place] Not Applicable Not Applicable Not Applicable  Some recent data might be hidden

## 2018-12-09 NOTE — Plan of Care (Signed)
°  Problem: Education: °Goal: Ability to demonstrate management of disease process will improve °Outcome: Adequate for Discharge °Goal: Ability to verbalize understanding of medication therapies will improve °Outcome: Adequate for Discharge °Goal: Individualized Educational Video(s) °Outcome: Adequate for Discharge °  °Problem: Activity: °Goal: Capacity to carry out activities will improve °Outcome: Adequate for Discharge °  °Problem: Cardiac: °Goal: Ability to achieve and maintain adequate cardiopulmonary perfusion will improve °Outcome: Adequate for Discharge °  °

## 2018-12-09 NOTE — Care Management Obs Status (Signed)
[place] NOTIFICATION   Patient Details  Name: [name] MRN: [id number] Date of Birth: 19-Oct-1965   Medicare Observation Status Notification Given:  Yes    Tommy Medal 12/09/2018, 12:19 PM

## 2018-12-09 NOTE — Care Management Important Message (Signed)
Important Message  Patient Details  Name: [name] MRN: [id number] Date of Birth: 10/10/1965   Medicare Important Message Given:  Yes     Tommy Medal 12/09/2018, 1:29 PM

## 2018-12-10 DIAGNOSIS — I5043 Acute on chronic combined systolic (congestive) and diastolic (congestive) heart failure: Secondary | ICD-10-CM | POA: Diagnosis not present

## 2018-12-10 DIAGNOSIS — J449 Chronic obstructive pulmonary disease, unspecified: Secondary | ICD-10-CM | POA: Diagnosis not present

## 2018-12-11 NOTE — Discharge Summary (Signed)
[facility] Discharge Summary   Patient: Samuel KlippelMark T Fitzpatrick ZOX:[id number]   PCP: Pearson GrippeKim, [name], MD DOB: 10/07/1965   Date of admission: 12/07/2018   Date of discharge: 12/09/2018     Discharge Diagnoses:   Principal Problem:   Cellulitis Active Problems:   Essential hypertension   Acute CHF (congestive heart failure) (HCC)   Admitted From: home Disposition:  Home   Recommendations for Outpatient Follow-up:  1. Please follow-up with PCP in 1 week. 2. Please remain compliant with medical regimen.  Follow-up Information    Pearson GrippeKim, [name], MD. Schedule an appointment as soon as possible for a visit in 1 week(s).   Specialty: Internal Medicine Contact information: [address] [place] [address] [phone]        Laqueta LindenKoneswaran, Suresh A, MD. Schedule an appointment as soon as possible for a visit in 1 month(s).   Specialty: Cardiology Contact information: [address] [place] [address] [phone]          Diet recommendation: Cardiac diet  Activity: The patient is advised to gradually reintroduce usual activities,as tolerated .  Discharge Condition: good  Code Status: Full code  History of present illness: As per the H and P dictated on admission, " [name]  is a 53 y.o. male, w chronic combined systolic and diastolic CHF with recurrent readmissions due to dietary medication noncompliance, atrial fibrillation on Xarelto, hypertension, MI, asthma, OSA noncompliant with CPAP and morbid obesity who presents due to yellow drainage from the back of his right mid calf area as well as near ankle.  Pt states this has been going on for about 1 week.  Pt notes that his right leg has been slightly tender where the drainage is coming from .  Pt notes his dyspnea might be slightly worse than normal .  Pt denies fever, chills, cough, cp, palp, n/v, abd pain, diarrhea, brbpr.  "  Hospital Course:  Summary of his active problems in the hospital is as following.  Cellulitis Blood culture x2 negative. We will check treat with doxycycline on discharge.  Mild Acute on Chronic CHF (systolic and diastolic) Treated with Lasix 40mg  iv bid (in placed of bumex) resume Bumex on discharge. Cont Kdur 40meq po qday Cont Spironolactone 100mg  po qday Cont Lisinopril 10mg  po qday Cont carvedilol 25mg  po bid  Pafib Cont carvedilol as above Cont Xarelto  Hyperlipidemia Cont Lipitor 40mg  po qhs  Gerd Cont PPI  Anemia Check cbc in am  Bph Cont flomax 0.4mg  po qhs  Patient was ambulatory without any assistance. On the day of the discharge the patient's vitals were stable , and no other acute medical condition were reported by patient. the patient was felt safe to be discharge at Home with .  Consultants: none Procedures: nne  DISCHARGE MEDICATION: Allergies as of 12/09/2018      Reactions   Banana Shortness Of Breath   Bee Venom Shortness Of Breath, Swelling, Other (See Comments)   Reaction:  Facial swelling   Strawberry Flavor Shortness Of Breath, Rash   Aspirin Other (See Comments)   Reaction:  GI upset    Metolazone Other (See Comments)   Pt states that he stopped taking this med due to heart attack like symptoms.    Oatmeal Nausea And Vomiting   Orange Juice [orange Oil] Nausea And Vomiting   All acidic products make him nauseous and upset stomach   Torsemide Swelling, Other (See Comments)   Reaction:  Swelling of feet/legs  Diltiazem Palpitations   Hydralazine Palpitations   Lipitor [atorvastatin] Other (See Comments)   Reaction:  Nose bleeds : Patient is currently taking this medication      Medication List    STOP taking these medications   diclofenac 50 MG tablet Commonly known as: CATAFLAM   furosemide 40 MG tablet Commonly known as: LASIX     TAKE these medications   acetaminophen 325 MG tablet Commonly known as: TYLENOL Take 2 tablets (650 mg total) by mouth every 4 (four) hours as needed for headache or mild  pain.   albuterol (2.5 MG/3ML) 0.083% nebulizer solution Commonly known as: PROVENTIL Take 2.5 mg by nebulization every 6 (six) hours as needed for wheezing or shortness of breath.   atorvastatin 40 MG tablet Commonly known as: LIPITOR Take 40 mg by mouth daily.   bumetanide 2 MG tablet Commonly known as: BUMEX Take 1 tablet (2 mg total) by mouth 3 (three) times daily.   carvedilol 25 MG tablet Commonly known as: COREG Take 1 tablet (25 mg total) by mouth 2 (two) times daily with a meal for 30 days.   docusate sodium 100 MG capsule Commonly known as: COLACE Take 1 capsule (100 mg total) by mouth 2 (two) times daily. What changed: when to take this   doxycycline 100 MG tablet Commonly known as: VIBRA-TABS Take 1 tablet (100 mg total) by mouth 2 (two) times daily for 3 days.   ferrous gluconate 324 MG tablet Commonly known as: FERGON Take 1 tablet (324 mg total) by mouth 2 (two) times daily with a meal. What changed: when to take this   fluticasone 220 MCG/ACT inhaler Commonly known as: FLOVENT HFA Inhale 2 puffs into the lungs daily as needed (for shortness of breath).   lisinopril 10 MG tablet Commonly known as: ZESTRIL Take 10 mg by mouth daily.   oxyCODONE-acetaminophen 5-325 MG tablet Commonly known as: PERCOCET/ROXICET Take 1 tablet by mouth every 6 (six) hours as needed.   pantoprazole 40 MG tablet Commonly known as: PROTONIX Take 1 tablet (40 mg total) by mouth daily at 6 (six) AM.   potassium chloride SA 20 MEQ tablet Commonly known as: K-DUR Take 2 tablets (40 mEq total) by mouth 2 (two) times daily.   rivaroxaban 20 MG Tabs tablet Commonly known as: XARELTO Take 1 tablet (20 mg total) by mouth daily with breakfast. What changed: when to take this   silver sulfADIAZINE 1 % cream Commonly known as: SILVADENE Apply 1 application topically daily.   spironolactone 100 MG tablet Commonly known as: ALDACTONE Take 100 mg by mouth daily. For fluid    tamsulosin 0.4 MG Caps capsule Commonly known as: FLOMAX Take 1 capsule (0.4 mg total) by mouth daily.      Allergies  Allergen Reactions  . Banana Shortness Of Breath  . Bee Venom Shortness Of Breath, Swelling and Other (See Comments)    Reaction:  Facial swelling  . Strawberry Flavor Shortness Of Breath and Rash  . Aspirin Other (See Comments)    Reaction:  GI upset   . Metolazone Other (See Comments)    Pt states that he stopped taking this med due to heart attack like symptoms.   . Oatmeal Nausea And Vomiting  . Orange Juice [Orange Oil] Nausea And Vomiting    All acidic products make him nauseous and upset stomach  . Torsemide Swelling and Other (See Comments)    Reaction:  Swelling of feet/legs   . Diltiazem Palpitations  .  Hydralazine Palpitations  . Lipitor [Atorvastatin] Other (See Comments)    Reaction:  Nose bleeds : Patient is currently taking this medication   Discharge Instructions    Diet - low sodium heart healthy   Complete by: As directed    Increase activity slowly   Complete by: As directed      Discharge Exam: Filed Weights   12/08/18 0011 12/08/18 0648 12/09/18 0545  Weight: (!) 171.9 kg (!) 168.4 kg (!) 165.9 kg   Vitals:   12/09/18 0545 12/09/18 1600  BP: 122/84 114/73  Pulse: 80 94  Resp: 20   Temp: 98.4 F (36.9 C)   SpO2: 96%    General: Appear in no distress, no Rash; Oral Mucosa Clear, moist. no Abnormal Mass Or lumps Cardiovascular: S1 and S2 Present, no Murmur, Respiratory: normal respiratory effort, Bilateral Air entry present and Clear to Auscultation, no Crackles, no wheezes Abdomen: Bowel Sound present, Soft and no tenderness, no hernia Extremities: Bilateral lymphedema Pedal edema, no calf tenderness Neurology: alert and oriented to time, place, and person affect appropriate. normal without focal findings, mental status, speech normal, alert and oriented x3, PERLA, Motor strength 5/5 and symmetric and sensation grossly normal  to light touch   The results of significant diagnostics from this hospitalization (including imaging, microbiology, ancillary and laboratory) are listed below for reference.    Significant Diagnostic Studies: Ct Tibia Fibula Right W Contrast  Result Date: 12/08/2018 CLINICAL DATA:  Right lower leg pain, generalized edema EXAM: CT OF THE LOWER RIGHT EXTREMITY WITH CONTRAST TECHNIQUE: Multidetector CT imaging of the lower right extremity was performed according to the standard protocol following intravenous contrast administration. COMPARISON:  Plain films 12/05/2018 CONTRAST:  100mL OMNIPAQUE IOHEXOL 300 MG/ML  SOLN FINDINGS: Diffuse generalized edema throughout the right lower extremity. Dilated varicose veins noted throughout the right lower extremity. No focal fluid collection. No acute bony abnormality. No bone destruction to suggest osteomyelitis. No fracture, subluxation or dislocation. IMPRESSION: No acute bony abnormality. Diffuse generalized edema throughout the right lower extremity. Prominent varicose veins most noted medially within the right calf. Electronically Signed   By: Charlett NoseKevin  Dover M.D.   On: 12/08/2018 03:52   Dg Chest Portable 1 View  Result Date: 12/08/2018 CLINICAL DATA:  Shortness of breath. EXAM: PORTABLE CHEST 1 VIEW COMPARISON:  Multiple prior exams most recently 3 days prior 12/05/2018. FINDINGS: Cardiomegaly is unchanged. Unchanged mediastinal contours. Vascular congestion. No focal airspace disease. No large pleural effusion. No pneumothorax. Soft tissue attenuation from habitus limits assessment. IMPRESSION: Unchanged cardiomegaly with vascular congestion. Electronically Signed   By: Narda RutherfordMelanie  Sanford M.D.   On: 12/08/2018 01:02   Dg Chest Portable 1 View  Result Date: 11/24/2018 CLINICAL DATA:  Increasing shortness of breath EXAM: PORTABLE CHEST 1 VIEW COMPARISON:  11/09/2018 FINDINGS: Cardiac shadow remains enlarged but stable. The lungs are well aerated bilaterally.  Vascular congestion is again seen. No focal infiltrate is noted. No bony abnormality is seen. IMPRESSION: Mild vascular congestion.  No focal infiltrate is noted Electronically Signed   By: Alcide CleverMark  Lukens M.D.   On: 11/24/2018 07:46    Microbiology: Recent Results (from the past 240 hour(s))  Blood culture (routine x 2)     Status: None (Preliminary result)   Collection Time: 12/08/18  3:07 AM   Specimen: Right Antecubital; Blood  Result Value Ref Range Status   Specimen Description RIGHT ANTECUBITAL  Final   Special Requests   Final    BOTTLES DRAWN AEROBIC AND ANAEROBIC Blood  Culture adequate volume   Culture   Final    NO GROWTH 3 DAYS Performed at [facility], [address], [place], [place] [address]    Report Status PENDING  Incomplete  Blood culture (routine x 2)     Status: None (Preliminary result)   Collection Time: 12/08/18  3:15 AM   Specimen: Left Antecubital; Blood  Result Value Ref Range Status   Specimen Description LEFT ANTECUBITAL  Final   Special Requests   Final    BOTTLES DRAWN AEROBIC AND ANAEROBIC Blood Culture adequate volume   Culture   Final    NO GROWTH 3 DAYS Performed at [facility], [address] [place], [place] [address]    Report Status PENDING  Incomplete  SARS Coronavirus 2 [facility] order, Performed in [place] hospital lab)     Status: None   Collection Time: 12/08/18  4:48 AM  Result Value Ref Range Status   SARS Coronavirus 2 NEGATIVE NEGATIVE Final    Comment: (NOTE) If result is NEGATIVE SARS-CoV-2 target nucleic acids are NOT DETECTED. The SARS-CoV-2 RNA is generally detectable in upper and lower  respiratory specimens during the acute phase of infection. The lowest  concentration of SARS-CoV-2 viral copies this assay can detect is 250  copies / mL. A negative result does not preclude SARS-CoV-2 infection  and should not be used as the sole basis for treatment or other  patient management decisions.  A negative result may occur  with  improper specimen collection / handling, submission of specimen other  than nasopharyngeal swab, presence of viral mutation(s) within the  areas targeted by this assay, and inadequate number of viral copies  (<250 copies / mL). A negative result must be combined with clinical  observations, patient history, and epidemiological information. If result is POSITIVE SARS-CoV-2 target nucleic acids are DETECTED. The SARS-CoV-2 RNA is generally detectable in upper and lower  respiratory specimens dur ing the acute phase of infection.  Positive  results are indicative of active infection with SARS-CoV-2.  Clinical  correlation with patient history and other diagnostic information is  necessary to determine patient infection status.  Positive results do  not rule out bacterial infection or co-infection with other viruses. If result is PRESUMPTIVE POSTIVE SARS-CoV-2 nucleic acids MAY BE PRESENT.   A presumptive positive result was obtained on the submitted specimen  and confirmed on repeat testing.  While 2019 novel coronavirus  (SARS-CoV-2) nucleic acids may be present in the submitted sample  additional confirmatory testing may be necessary for epidemiological  and / or clinical management purposes  to differentiate between  SARS-CoV-2 and other Sarbecovirus currently known to infect humans.  If clinically indicated additional testing with an alternate test  methodology [phone]) is advised. The SARS-CoV-2 RNA is generally  detectable in upper and lower respiratory sp ecimens during the acute  phase of infection. The expected result is Negative. Fact Sheet for Patients:  StrictlyIdeas.no Fact Sheet for Healthcare Providers: BankingDealers.co.za This test is not yet approved or cleared by the [place] FDA and has been authorized for detection and/or diagnosis of SARS-CoV-2 by FDA under an Emergency Use Authorization (EUA).  This EUA  will remain in effect (meaning this test can be used) for the duration of the COVID-19 declaration under Section 564(b)(1) of the Act, 21 U.S.C. section 360bbb-3(b)(1), unless the authorization is terminated or revoked sooner. Performed at [facility], [address], [place], [place] [address]      Labs: CBC: Recent Labs  Lab 12/08/18 0022 12/09/18 0548  WBC 4.3 4.4  NEUTROABS 2.7  --   HGB 12.6* 12.0*  HCT 39.4 38.8*  MCV 88.1 88.8  PLT 223 241   Basic Metabolic Panel: Recent Labs  Lab 12/08/18 0022 12/09/18 0548  NA 140 141  K 4.0 3.9  CL 106 107  CO2 24 26  GLUCOSE 97 90  BUN 15 14  CREATININE 0.98 1.06  CALCIUM 8.7* 8.5*   Liver Function Tests: Recent Labs  Lab 12/08/18 0022 12/09/18 0548  AST 18 17  ALT 16 13  ALKPHOS 75 46  BILITOT 0.7 1.1  PROT 7.4 6.7  ALBUMIN 3.6 3.3*   No results for input(s): LIPASE, AMYLASE in the last 168 hours. No results for input(s): AMMONIA in the last 168 hours. Cardiac Enzymes: Recent Labs  Lab 12/08/18 0022 12/08/18 0559 12/08/18 1217 12/08/18 1830  TROPONINI <0.03 <0.03 <0.03 0.03*   BNP (last 3 results) Recent Labs    11/09/18 0635 11/24/18 0634 12/08/18 0022  BNP 357.0* 281.0* 224.0*   CBG: No results for input(s): GLUCAP in the last 168 hours. Time spent: 35 minutes  Signed:  Lynden Oxfordranav   [facility] 12/09/2018

## 2018-12-12 ENCOUNTER — Other Ambulatory Visit: Payer: Self-pay | Admitting: Pharmacist

## 2018-12-12 NOTE — Patient Outreach (Addendum)
[facility]) Care Management  [place]   12/12/2018  [name] 08-21-1965 [id number]  Reason for referral: Medication Assistance, Medication Reconciliation Post Discharge, Medication Management  Referral source: [facility] RN Current insurance: [facility]  PMHx includes but not limited to:   chronic combined systolic and diastolic CHF, A fib,  Pulmonary hypertension, OSA, morbid obesity, obesity hyperventilation syndrome, GERD and hypertension. Patient has had recurrent readmissions due to dietary medication noncompliance and volume overload. Patient was hospitalized most recently for leg swelling and drainage. He was treated for cellulitis.   Outreach:  Successful telephone call with patient.  HIPAA identifiers verified.   Subjective:  Patient reports feeling ok but was very upset about his medications. He said they "don't work". He expressed anger about not being given brand name medications.    Objective: The ASCVD Risk score [name], et al., 2013) failed to calculate for the following reasons:   Cannot find a previous HDL lab   Cannot find a previous total cholesterol lab  Lab Results  Component Value Date   CREATININE 1.06 12/09/2018   CREATININE 0.98 12/08/2018   CREATININE 1.23 11/26/2018    Lab Results  Component Value Date   HGBA1C 5.7 (H) 04/04/2016    Lipid Panel     Component Value Date/Time   CHOL 139 06/14/2015 0328   TRIG 58 06/14/2015 0328   HDL 28 (L) 06/14/2015 0328   CHOLHDL 5.0 06/14/2015 0328   VLDL 12 06/14/2015 0328   LDLCALC 99 06/14/2015 0328    BP Readings from Last 3 Encounters:  12/09/18 114/73  11/30/18 (!) 156/108  11/28/18 (!) 154/112    Allergies  Allergen Reactions  . Banana Shortness Of Breath  . Bee Venom Shortness Of Breath, Swelling and Other (See Comments)    Reaction:  Facial swelling  . Strawberry Flavor Shortness Of Breath and Rash  . Aspirin Other (See Comments)     Reaction:  GI upset   . Metolazone Other (See Comments)    Pt states that he stopped taking this med due to heart attack like symptoms.   . Oatmeal Nausea And Vomiting  . Orange Juice [Orange Oil] Nausea And Vomiting    All acidic products make him nauseous and upset stomach  . Torsemide Swelling and Other (See Comments)    Reaction:  Swelling of feet/legs   . Diltiazem Palpitations  . Hydralazine Palpitations  . Lipitor [Atorvastatin] Other (See Comments)    Reaction:  Nose bleeds : Patient is currently taking this medication    Medications Reviewed Today    Reviewed by Everett Graff, Oneita Hurt, CPhT (Pharmacy Technician) on 12/08/18 at [place] List Status: Complete  Medication Order Taking? Sig Documenting Provider Last Dose Status Informant  acetaminophen (TYLENOL) 325 MG tablet [id number] No Take 2 tablets (650 mg total) by mouth every 4 (four) hours as needed for headache or mild pain. Debbe Odea, MD 12/07/2018 Active Self  albuterol (PROVENTIL) (2.5 MG/3ML) 0.083% nebulizer solution [id number] Yes Take 2.5 mg by nebulization every 6 (six) hours as needed for wheezing or shortness of breath. [provider] 12/06/2018 Active Self           Med Note Trinidad Curet, DANA K   Thu Mar 04, 2017  8:53 PM)    atorvastatin (LIPITOR) 40 MG tablet [id number] Yes Take 40 mg by mouth daily.  [provider] 12/06/2018 Active Self  bumetanide (BUMEX) 2 MG tablet [id number] Yes Take 1  tablet (2 mg total) by mouth 3 (three) times daily for 30 days. Sherryll Burger, Pratik D, DO 12/07/2018 Active Self  carvedilol (COREG) 25 MG tablet [id number] Yes Take 1 tablet (25 mg total) by mouth 2 (two) times daily with a meal for 30 days. Sherryll Burger, Pratik D, DO 12/07/2018 Unknown time Active Self  diclofenac (CATAFLAM) 50 MG tablet [id number] Yes Take 50 mg by mouth 3 (three) times daily.  [provider] 12/07/2018 Unknown time Active Self  docusate sodium (COLACE) 100 MG capsule [id number] Yes Take 1 capsule (100 mg  total) by mouth 2 (two) times daily.  Patient taking differently: Take 100 mg by mouth every morning.    Kathlen Mody, MD 12/07/2018 Unknown time Active Self  ferrous gluconate (FERGON) 324 MG tablet [id number] Yes Take 1 tablet (324 mg total) by mouth 2 (two) times daily with a meal.  Patient taking differently: Take 324 mg by mouth daily.    Kathlen Mody, MD 12/06/2018 Active Self  fluticasone (FLOVENT HFA) 220 MCG/ACT inhaler [id number] Yes Inhale 2 puffs into the lungs daily as needed (for shortness of breath).  [provider] 11/29/2018 Active Self  furosemide (LASIX) 40 MG tablet [id number] Yes Take 40 mg by mouth daily.  [provider] 12/05/2018 Active Self  lisinopril (ZESTRIL) 10 MG tablet [id number] Yes Take 10 mg by mouth daily. [provider] 12/06/2018 Active Self  oxyCODONE-acetaminophen (PERCOCET/ROXICET) 5-325 MG tablet [id number] Yes Take 1 tablet by mouth every 6 (six) hours as needed. Bethann Berkshire, MD 12/07/2018 Unknown time Active Self  pantoprazole (PROTONIX) 40 MG tablet [id number] Yes Take 1 tablet (40 mg total) by mouth daily at 6 (six) AM. Kathlen Mody, MD 12/07/2018 Unknown time Active Self  potassium chloride SA (K-DUR) 20 MEQ tablet [id number] Yes Take 2 tablets (40 mEq total) by mouth 2 (two) times daily. Vassie Loll, MD 12/07/2018 Unknown time Active Self  rivaroxaban (XARELTO) 20 MG TABS tablet [id number] Yes Take 1 tablet (20 mg total) by mouth daily with breakfast.  Patient taking differently: Take 20 mg by mouth daily with supper.    Kathlen Mody, MD 12/07/2018 0500pm Active Self           Med Note Fredric Mare, Hershal Coria Sep 18, 2018  9:10 AM)    silver sulfADIAZINE (SILVADENE) 1 % cream [id number] Yes Apply 1 application topically daily. Maxwell Caul, PA-C 12/07/2018 Unknown time Active Self  spironolactone (ALDACTONE) 100 MG tablet [id number] Yes Take 100 mg by mouth daily. For fluid [provider] 12/06/2018 Active Self   tamsulosin (FLOMAX) 0.4 MG CAPS capsule [id number] Yes Take 1 capsule (0.4 mg total) by mouth daily. Kathlen Mody, MD 12/06/2018 Active Self          ASSESSMENT: Date Discharged from Hospital: 12/09/2018 Date Medication Reconciliation Performed: 12/12/2018  Medications:  New at Discharge: . Doxycycline   Discontinued at Discharge:   Diclofenac  Acetaminophen  Patient was recently discharged from hospital and all medications have been reviewed.  Drugs sorted by system:  Assessment: Patient's medications were reviewed via telephone.    Drugs sorted by system:   Cardiovascular: Atorvastatin, Bumetanide, Carvedilol,  Lisinopril, Xarelto, Spironolactone,   Pulmonary/Allergy: Albuterol Nebulizer Solution, Fluticasone HFA, Oxygen,   Gastrointestinal: Docusate Sodium, Pantoprazole,   Pain: Acetaminophen, Oxycodone/APAP  Genitourinary: Tamsulosin  Vitamins/Minerals/Supplements: Ferrous Gluconate, Potassium Chloride,   Topical: Silver Sulfadiazine Cream  Infectious Disease Doxycycline  Medication Review Findings:   Non-compliance: Bumetanide-patient would not confirm if he was/is taking Bumetanide as prescribed.  He seemed very upset about his medications because he believes he is not being given "real medications" . Patient thinks brand name bumetanide would be more effective.  Patient reported his weight today as "370'something pounds". Weight at discharge [phone] pounds.  He was having lunch during our conversation and reported having a Malaw[place]urkey breast (lunch meat) sandwich, potato chips and soda as his meal.  Patient said he did not think what he eats has any thing to do with his continued swelling.  He blamed his condition on not having "real medicine".    Medication Assistance Findings:  No medication assistance needs identified   Medication Adherence Findings: Adherence Review  []  Excellent (no doses missed/week)     []  Good (no more than 1  dose missed/week)     [x]  Partial (2-3 doses missed/week) []  Poor (>3 doses missed/week)  Patient with good understanding of regimen and poor understanding of indications.    Medication Assistance Findings:  No medication assistance needs identified  Extra Help:  Already receiving Full Extra Help Low Income Subsidy  Dr. Elmyra RicksKim's office was called and they were asked to send in a new prescription for brand name Bumex to begin the process of starting a prior authorization.  Dr. Selena BattenKim would have to sign on the DAW "dispense as written" line. Once the prior authorization form has been provided to the physician, he will have to provide a clinical reason for the brand name product over the generic.  Psychological reasons can be used.  Nurse at Dr. Elmyra RicksKim's office said she would speak with Dr. Selena BattenKim and get back to me. Patient has a hospital follow up on 12/15/2018.  Patient communicated understanding that the prior authorization process was beginning and that he needed to answer his phone as much as possible just in case the insurance company needed him to answer any questions.  Plan: . Will follow-up in 3-5 business days.  . Await a call back from Dr. Selena BattenKim to begin the prior authorization process.  Beecher McardleKatina J. Keaja Reaume, PharmD, BCACP [facility] Clinical Pharmacist [phone]

## 2018-12-13 ENCOUNTER — Other Ambulatory Visit: Payer: Self-pay

## 2018-12-13 LAB — CULTURE, BLOOD (ROUTINE X 2)
Culture: NO GROWTH
Culture: NO GROWTH
Special Requests: ADEQUATE
Special Requests: ADEQUATE

## 2018-12-13 NOTE — Patient Outreach (Signed)
[facility]) Care Management  12/13/2018  [name] 20-Nov-1965 [id number]   Successful outreach with [name]. He was recently hospitalized for Cellulitis and Mild Acute on Chronic CHF.  He reports feeling well today. Reports a nonproductive cough but denies complaints of shortness of breath. No complaints of chest pain or palpitations. Denies fevers. He reports drainage and increased edema to his right leg. Per chart review, [facility] was assigned to provide wound care and home health services. He reports not receiving outreach from [facility] since discharge on 12/09/18. Call placed to [facility].  Left voice message requesting a return call.  He was unable to recall his exact weight. Reports a reading of "around 372lbs."   Discussed medications. He is not taking bumetanide three times a day as prescribed. Continues to state, "it's generic and not the real medicine." He is aware that the [facility] Pharmacist is assisting with the prior authorization request for Bumex. Today he reports also needing "real blood pressure medication."  [name] is scheduled for follow-up with [facility] on 12/15/18. Denies need for transportation assistance.   PLAN -Pending return call from [facility]. -Will follow-up with [facility] regarding possible need for new [facility] referral. -Will update [facility] Pharmacist. -Will attempt outreach with [name] later this week.   [place] [phone]

## 2018-12-14 ENCOUNTER — Inpatient Hospital Stay (HOSPITAL_COMMUNITY)
Admission: EM | Admit: 2018-12-14 | Discharge: 2018-12-18 | DRG: 291 | Disposition: A | Discharge status: Home Health | Payer: Medicare Other | Attending: Internal Medicine | Admitting: Internal Medicine

## 2018-12-14 ENCOUNTER — Encounter (HOSPITAL_COMMUNITY): Payer: Self-pay | Admitting: Emergency Medicine

## 2018-12-14 ENCOUNTER — Other Ambulatory Visit: Payer: Self-pay

## 2018-12-14 ENCOUNTER — Emergency Department (HOSPITAL_COMMUNITY): Payer: Medicare Other

## 2018-12-14 DIAGNOSIS — R0602 Shortness of breath: Secondary | ICD-10-CM | POA: Diagnosis not present

## 2018-12-14 DIAGNOSIS — I5043 Acute on chronic combined systolic (congestive) and diastolic (congestive) heart failure: Secondary | ICD-10-CM

## 2018-12-14 DIAGNOSIS — I480 Paroxysmal atrial fibrillation: Secondary | ICD-10-CM | POA: Diagnosis present

## 2018-12-14 DIAGNOSIS — I110 Hypertensive heart disease with heart failure: Secondary | ICD-10-CM | POA: Diagnosis not present

## 2018-12-14 DIAGNOSIS — N400 Enlarged prostate without lower urinary tract symptoms: Secondary | ICD-10-CM | POA: Diagnosis present

## 2018-12-14 DIAGNOSIS — I5031 Acute diastolic (congestive) heart failure: Secondary | ICD-10-CM | POA: Diagnosis present

## 2018-12-14 DIAGNOSIS — L03115 Cellulitis of right lower limb: Secondary | ICD-10-CM | POA: Diagnosis not present

## 2018-12-14 DIAGNOSIS — I10 Essential (primary) hypertension: Secondary | ICD-10-CM | POA: Diagnosis not present

## 2018-12-14 DIAGNOSIS — R609 Edema, unspecified: Secondary | ICD-10-CM | POA: Diagnosis not present

## 2018-12-14 DIAGNOSIS — Z6841 Body Mass Index (BMI) 40.0 and over, adult: Secondary | ICD-10-CM

## 2018-12-14 DIAGNOSIS — E785 Hyperlipidemia, unspecified: Secondary | ICD-10-CM | POA: Diagnosis present

## 2018-12-14 DIAGNOSIS — L03116 Cellulitis of left lower limb: Secondary | ICD-10-CM | POA: Diagnosis not present

## 2018-12-14 DIAGNOSIS — I252 Old myocardial infarction: Secondary | ICD-10-CM | POA: Diagnosis not present

## 2018-12-14 DIAGNOSIS — Z7901 Long term (current) use of anticoagulants: Secondary | ICD-10-CM | POA: Diagnosis not present

## 2018-12-14 DIAGNOSIS — I130 Hypertensive heart and chronic kidney disease with heart failure and stage 1 through stage 4 chronic kidney disease, or unspecified chronic kidney disease: Secondary | ICD-10-CM | POA: Diagnosis not present

## 2018-12-14 DIAGNOSIS — Z9119 Patient's noncompliance with other medical treatment and regimen: Secondary | ICD-10-CM | POA: Diagnosis not present

## 2018-12-14 DIAGNOSIS — Z20828 Contact with and (suspected) exposure to other viral communicable diseases: Secondary | ICD-10-CM | POA: Diagnosis present

## 2018-12-14 DIAGNOSIS — Z87891 Personal history of nicotine dependence: Secondary | ICD-10-CM

## 2018-12-14 DIAGNOSIS — K219 Gastro-esophageal reflux disease without esophagitis: Secondary | ICD-10-CM | POA: Diagnosis not present

## 2018-12-14 DIAGNOSIS — Z823 Family history of stroke: Secondary | ICD-10-CM | POA: Diagnosis not present

## 2018-12-14 DIAGNOSIS — Z8249 Family history of ischemic heart disease and other diseases of the circulatory system: Secondary | ICD-10-CM

## 2018-12-14 DIAGNOSIS — J9611 Chronic respiratory failure with hypoxia: Secondary | ICD-10-CM | POA: Diagnosis not present

## 2018-12-14 DIAGNOSIS — E662 Morbid (severe) obesity with alveolar hypoventilation: Secondary | ICD-10-CM | POA: Diagnosis present

## 2018-12-14 DIAGNOSIS — Z9111 Patient's noncompliance with dietary regimen: Secondary | ICD-10-CM | POA: Diagnosis not present

## 2018-12-14 DIAGNOSIS — R52 Pain, unspecified: Secondary | ICD-10-CM | POA: Diagnosis not present

## 2018-12-14 DIAGNOSIS — E66813 Obesity, class 3: Secondary | ICD-10-CM

## 2018-12-14 DIAGNOSIS — N182 Chronic kidney disease, stage 2 (mild): Secondary | ICD-10-CM

## 2018-12-14 DIAGNOSIS — J449 Chronic obstructive pulmonary disease, unspecified: Secondary | ICD-10-CM | POA: Diagnosis not present

## 2018-12-14 LAB — COMPREHENSIVE METABOLIC PANEL
ALT: 16 U/L (ref 0–44)
AST: 19 U/L (ref 15–41)
Albumin: 3.8 g/dL (ref 3.5–5.0)
Alkaline Phosphatase: 64 U/L (ref 38–126)
Anion gap: 9 (ref 5–15)
BUN: 13 mg/dL (ref 6–20)
CO2: 23 mmol/L (ref 22–32)
Calcium: 8.4 mg/dL — ABNORMAL LOW (ref 8.9–10.3)
Chloride: 104 mmol/L (ref 98–111)
Creatinine, Ser: 0.98 mg/dL (ref 0.61–1.24)
GFR calc Af Amer: >60 mL/min (ref >60)
GFR calc non Af Amer: >60 mL/min (ref >60)
Glucose, Bld: 100 mg/dL — ABNORMAL HIGH (ref 70–99)
Potassium: 3.9 mmol/L (ref 3.5–5.1)
Sodium: 136 mmol/L (ref 135–145)
Total Bilirubin: 0.7 mg/dL (ref 0.3–1.2)
Total Protein: 7.3 g/dL (ref 6.5–8.1)

## 2018-12-14 LAB — CBC WITH DIFFERENTIAL/PLATELET
Abs Immature Granulocytes: 0.01 10*3/uL (ref 0.00–0.07)
Basophils Absolute: 0 10*3/uL (ref 0.0–0.1)
Basophils Relative: 0 %
Eosinophils Absolute: 0.1 10*3/uL (ref 0.0–0.5)
Eosinophils Relative: 1 %
HCT: 41.2 % (ref 39.0–52.0)
Hemoglobin: 12.7 g/dL — ABNORMAL LOW (ref 13.0–17.0)
Immature Granulocytes: 0 %
Lymphocytes Relative: 24 %
Lymphs Abs: 1.1 10*3/uL (ref 0.7–4.0)
MCH: 27.4 pg (ref 26.0–34.0)
MCHC: 30.8 g/dL (ref 30.0–36.0)
MCV: 89 fL (ref 80.0–100.0)
Monocytes Absolute: 0.5 10*3/uL (ref 0.1–1.0)
Monocytes Relative: 11 %
Neutro Abs: 2.9 10*3/uL (ref 1.7–7.7)
Neutrophils Relative %: 64 %
Platelets: 224 10*3/uL (ref 150–400)
RBC: 4.63 MIL/uL (ref 4.22–5.81)
RDW: 14.7 % (ref 11.5–15.5)
WBC: 4.6 10*3/uL (ref 4.0–10.5)
nRBC: 0 % (ref 0.0–0.2)

## 2018-12-14 LAB — BRAIN NATRIURETIC PEPTIDE: B Natriuretic Peptide: 303 pg/mL — ABNORMAL HIGH (ref 0.0–100.0)

## 2018-12-14 LAB — D-DIMER, QUANTITATIVE: D-Dimer, Quant: 0.37 ug/mL-FEU (ref 0.00–0.50)

## 2018-12-14 LAB — TROPONIN I (HIGH SENSITIVITY)
Troponin I (High Sensitivity): 22 ng/L — ABNORMAL HIGH (ref <18)
Troponin I (High Sensitivity): 98 ng/L — ABNORMAL HIGH (ref <18)

## 2018-12-14 LAB — LACTIC ACID, PLASMA: Lactic Acid, Venous: 0.9 mmol/L (ref 0.5–1.9)

## 2018-12-14 LAB — SARS CORONAVIRUS 2 BY RT PCR (HOSPITAL ORDER, PERFORMED IN ~~LOC~~ HOSPITAL LAB): SARS Coronavirus 2: NEGATIVE

## 2018-12-14 MED ORDER — FUROSEMIDE 10 MG/ML IJ SOLN
40.0000 mg | Freq: Once | INTRAMUSCULAR | Status: AC
  Administered 2018-12-14: 40 mg via INTRAVENOUS
  Filled 2018-12-14: qty 4

## 2018-12-14 MED ORDER — VANCOMYCIN HCL IN DEXTROSE 1-5 GM/200ML-% IV SOLN
1000.0000 mg | Freq: Once | INTRAVENOUS | Status: AC
  Administered 2018-12-14: 22:00:00 1000 mg via INTRAVENOUS
  Filled 2018-12-14: qty 200

## 2018-12-14 MED ORDER — SODIUM CHLORIDE 0.9 % IV SOLN
1.0000 g | Freq: Once | INTRAVENOUS | Status: AC
  Administered 2018-12-14: 1 g via INTRAVENOUS
  Filled 2018-12-14: qty 10

## 2018-12-14 NOTE — ED Triage Notes (Signed)
Pt c/o bilateral lower leg edema, RT greater that left. Pt also states he thinks he has an infection in RLE. Pt noted to have drainage from leg. Hx of CHF. Denies worsening SOB than normal.

## 2018-12-14 NOTE — Progress Notes (Signed)
This encounter was created in error - please disregard.

## 2018-12-14 NOTE — ED Provider Notes (Addendum)
[facility] EMERGENCY DEPARTMENT Provider Note   CSN: [id number] Arrival date & time: 12/14/18  1750     History   Chief Complaint Chief Complaint  Patient presents with  . Leg Swelling    HPI [name] is a 53 y.o. male.     Patient is a 53 year old male who presents to the emergency department with a complaint of worsening of leg swelling.  Patient has a history of obesity, swelling of the lower extremities, right greater than left, drainage from the right leg, chronic combined diastolic and systolic congestive heart failure, atrial fibrillation on Xarelto, hypertension, sleep apnea on CPAP, and history of myocardial infarction.  Patient presents to the emergency department today because he says the swelling of his legs is getting worse.  He says that this is been getting particularly worse over the last 2 days.  He feels like that he is been gaining 1 to 2 pounds daily via check of his scales at home.  He is on Bumex, but he says he still seems to be gaining weight.  He says that he is more short of breath than usual.  He says that he has to sleep and sit up in a chair because he cannot lay flat and breathe.  The patient is very concerned about infection in his leg, particularly his right leg.  He says that the drainage at times has color to it and is more profuse than usual.  He has not had any fever or chills to be reported.  Patient does complain of pain of his legs.  There is been no nausea or vomiting reported.  No loss of consciousness reported.  The patient uses home oxygen, but only on an as needed basis.  The patient states that he is been compliant with his medications, with his CPAP, and with his home oxygen.  The history is provided by the patient.    Past Medical History:  Diagnosis Date  . Allergic rhinitis   . Asthma   . Atrial fibrillation ([place])   . CHF (congestive heart failure) ([place])   . Elevated troponin level not due myocardial infarction 06/13/2015   cath w/ nl cors, felt 2nd HTN, CHF, peat trop 2.73  . Essential hypertension   . Gastroesophageal reflux disease   . History of cardiac catheterization    Normal coronaries December 2016  . Noncompliance    Major problem leading to declining health and recurrent hospitalization  . Nonischemic cardiomyopathy ([place])    a. LVEF 20-25% in 2017 with cath in 2016 showing normal cors. b. EF 55% by echo in 03/2018.  . On home O2    3L N/C  . OSA (obstructive sleep apnea)   . Osteoarthritis   . Peptic ulcer disease     Patient Active Problem List   Diagnosis Date Noted  . Cellulitis 12/08/2018  . Chronic obstructive pulmonary disease ([place])   . Benign prostatic hyperplasia without lower urinary tract symptoms   . Acute on chronic combined systolic and diastolic CHF (congestive heart failure) ([place]) 10/02/2018  . AKI (acute kidney injury) ([place]) 09/18/2018  . Acute on chronic combined systolic and diastolic congestive heart failure ([place]) 08/15/2018  . SOB (shortness of breath) 08/03/2018  . Volume overload 07/22/2018  . Acute systolic CHF (congestive heart failure) ([place]) 07/04/2018  . Acute on chronic combined systolic and diastolic heart failure ([place]) 04/01/2018  . Hypomagnesemia 04/01/2018  . Acute on chronic diastolic CHF (congestive heart failure) ([place]) 03/11/2018  .  Pressure injury of skin 03/11/2018  . CHF (congestive heart failure) (HCC) 09/08/2017  . Altered mental status 05/11/2017  . Acute exacerbation of CHF (congestive heart failure) (HCC) 04/20/2017  . Acute on chronic systolic (congestive) heart failure (HCC) 04/19/2017  . CHF exacerbation (HCC) 04/11/2017  . Chronic respiratory failure with hypoxia (HCC) 04/11/2017  . Encounter for hospice care discussion   . Palliative care encounter   . Goals of care, counseling/discussion   . DNR (do not resuscitate)   . DNR (do not resuscitate) discussion   . Chronic congestive heart failure (HCC) 02/18/2017  . Acute systolic heart  failure (HCC) 47/82/956207/30/2018  . Pedal edema 12/02/2016  . Acute CHF (congestive heart failure) (HCC) 11/09/2016  . Acute on chronic systolic CHF (congestive heart failure) (HCC) 08/19/2016  . CKD (chronic kidney disease), stage II 05/17/2016  . Coronary arteries, normal Dec 2016 01/23/2016  . Chronic anticoagulation-Xarelto 01/23/2016  . NSVT (nonsustained ventricular tachycardia) (HCC) 01/23/2016  . Elevated troponin 12/21/2015  . Nonischemic cardiomyopathy (HCC)   . Morbid obesity due to excess calories (HCC)   . Noncompliance with diet and medication regimen 12/02/2015  . OSA (obstructive sleep apnea) 09/20/2015  . Pulmonary hypertension (HCC) 09/19/2015  . Normocytic anemia 09/19/2015  . Atrial fibrillation, chronic   . Dyspnea on exertion 05/28/2015  . Acute respiratory failure with hypoxia (HCC) 04/01/2015  . Hypokalemia 04/01/2015  . Obesity hypoventilation syndrome (HCC) 08/08/2014  . GERD (gastroesophageal reflux disease) 08/08/2014  . Edema of right lower extremity 06/21/2014  . Fecal urgency 06/21/2014  . Asthma 02/11/2009  . Hyperlipidemia 07/12/2008  . OBESITY, MORBID 07/12/2008  . Anxiety state 07/12/2008  . DEPRESSION 07/12/2008  . Essential hypertension 07/12/2008  . ALLERGIC RHINITIS 07/12/2008  . Peptic ulcer 07/12/2008  . OSTEOARTHRITIS 07/12/2008    Past Surgical History:  Procedure Laterality Date  . CARDIAC CATHETERIZATION N/A 06/14/2015   Procedure: Right/Left Heart Cath and Coronary Angiography;  Surgeon: Runell GessJonathan J Berry, MD;  [facility];  Service: Cardiovascular;  Laterality: N/A;        Home Medications    Prior to Admission medications   Medication Sig Start Date End Date Taking? Authorizing Provider  acetaminophen (TYLENOL) 325 MG tablet Take 2 tablets (650 mg total) by mouth every 4 (four) hours as needed for headache or mild pain. 11/02/18   Calvert Cantorizwan, Saima, MD  albuterol (PROVENTIL) (2.5 MG/3ML) 0.083% nebulizer solution Take  2.5 mg by nebulization every 6 (six) hours as needed for wheezing or shortness of breath.    [provider]  atorvastatin (LIPITOR) 40 MG tablet Take 40 mg by mouth daily.  09/05/18   [provider]  bumetanide (BUMEX) 2 MG tablet Take 1 tablet (2 mg total) by mouth 3 (three) times daily. 12/09/18   Rolly SalterPatel, Pranav M, MD  carvedilol (COREG) 25 MG tablet Take 1 tablet (25 mg total) by mouth 2 (two) times daily with a meal for 30 days. 08/18/18 11/04/19  Sherryll BurgerShah, Pratik D, DO  docusate sodium (COLACE) 100 MG capsule Take 1 capsule (100 mg total) by mouth 2 (two) times daily. Patient taking differently: Take 100 mg by mouth every morning.  06/25/16   Kathlen ModyAkula, Vijaya, MD  ferrous gluconate (FERGON) 324 MG tablet Take 1 tablet (324 mg total) by mouth 2 (two) times daily with a meal. Patient taking differently: Take 324 mg by mouth daily.  06/25/16   Kathlen ModyAkula, Vijaya, MD  fluticasone (FLOVENT HFA) 220 MCG/ACT inhaler Inhale 2 puffs into the  lungs daily as needed (for shortness of breath).     [provider]  lisinopril (ZESTRIL) 10 MG tablet Take 10 mg by mouth daily. 11/22/18   [provider]  oxyCODONE-acetaminophen (PERCOCET/ROXICET) 5-325 MG tablet Take 1 tablet by mouth every 6 (six) hours as needed. 11/30/18   Bethann Berkshire, MD  pantoprazole (PROTONIX) 40 MG tablet Take 1 tablet (40 mg total) by mouth daily at 6 (six) AM. 06/26/16   Kathlen Mody, MD  potassium chloride SA (K-DUR) 20 MEQ tablet Take 2 tablets (40 mEq total) by mouth 2 (two) times daily. 10/07/18   Vassie Loll, MD  rivaroxaban (XARELTO) 20 MG TABS tablet Take 1 tablet (20 mg total) by mouth daily with breakfast. Patient taking differently: Take 20 mg by mouth daily with supper.  06/26/16   Kathlen Mody, MD  silver sulfADIAZINE (SILVADENE) 1 % cream Apply 1 application topically daily. 11/28/18   Maxwell Caul, PA-C  spironolactone (ALDACTONE) 100 MG tablet Take 100 mg by mouth daily. For fluid 03/18/17   [provider]  tamsulosin (FLOMAX) 0.4 MG CAPS capsule Take 1 capsule (0.4 mg total) by mouth daily. 06/25/16   Kathlen Mody, MD  potassium chloride 20 MEQ TBCR Take 20 mEq by mouth 2 (two) times daily. 09/03/15 11/04/18  Ward, Layla Maw, DO    Family History Family History  Problem Relation Age of Onset  . Stroke Father   . Heart attack Father   . Aneurysm Mother        Cerebral aneurysm  . Hypertension Sister   . Colon cancer Neg Hx   . Inflammatory bowel disease Neg Hx   . Liver disease Neg Hx     Social History Social History   Tobacco Use  . Smoking status: Former Smoker    Packs/day: 0.50    Years: 20.00    Pack years: 10.00    Types: Cigarettes    Start date: 04/26/1988    Quit date: 06/23/2007    Years since quitting: 11.4  . Smokeless tobacco: Never Used  . Tobacco comment: 1 ppd former smoker  Substance Use Topics  . Alcohol use: Not Currently    Alcohol/week: 0.0 standard drinks    Comment: No etoh since 2009  . Drug use: No     Allergies   Banana, Bee venom, Strawberry flavor, Aspirin, Metolazone, Oatmeal, Orange juice [orange oil], Torsemide, Diltiazem, Hydralazine, and Lipitor [atorvastatin]   Review of Systems Review of Systems  Constitutional: Positive for activity change and fatigue. Negative for chills and fever.       All ROS Neg except as noted in HPI  HENT: Negative for nosebleeds.   Eyes: Negative for photophobia and discharge.  Respiratory: Positive for shortness of breath. Negative for cough, wheezing and stridor.   Cardiovascular: Positive for leg swelling. Negative for chest pain and palpitations.  Gastrointestinal: Negative for abdominal pain and blood in stool.  Genitourinary: Negative for dysuria, frequency and hematuria.  Musculoskeletal: Positive for arthralgias. Negative for back pain and neck pain.  Skin: Positive for wound.       Right leg wound  Neurological: Positive for numbness. Negative for dizziness, seizures and speech  difficulty.       Upper extremity numbness at times.  Psychiatric/Behavioral: Negative for confusion and hallucinations.     Physical Exam Updated Vital Signs BP (!) 172/103   Pulse (!) 103   Temp 98.8 F (37.1 C) (Oral)   Resp (!) 26   Ht 6' (  1.829 m)   Wt (!) 170.1 kg   SpO2 96%   BMI 50.86 kg/m   Physical Exam Constitutional:      Appearance: He is well-developed. He is obese. He is ill-appearing.  HENT:     Head: Normocephalic and atraumatic.     Right Ear: Tympanic membrane, ear canal and external ear normal.     Left Ear: Tympanic membrane, ear canal and external ear normal.     Nose: Nose normal.     Mouth/Throat:     Pharynx: Uvula midline.  Eyes:     General: Lids are normal.     Conjunctiva/sclera: Conjunctivae normal.     Pupils: Pupils are equal, round, and reactive to light.  Neck:     Musculoskeletal: Normal range of motion and neck supple. No neck rigidity.     Vascular: No carotid bruit.     Trachea: Trachea and phonation normal.  Cardiovascular:     Rate and Rhythm: Normal rate. Rhythm irregularly irregular. Frequent extrasystoles are present.    Pulses: Normal pulses.          Radial pulses are 2+ on the right side and 2+ on the left side.       Dorsalis pedis pulses are 2+ on the right side and 2+ on the left side.     Heart sounds: No friction rub.  Pulmonary:     Eff[place]: Tachypnea present.     Comments: Pt speaks in short sentences. There is symmetrical rise and fall of the chest. Abdominal:     General: Bowel sounds are normal.     Palpations: Abdomen is soft.  Musculoskeletal:     Right lower leg: 3+ Pitting Edema present.     Left lower leg: 1+ Edema present.  Lymphadenopathy:     Head:     Right side of head: No submental, preauricular or posterior auricular adenopathy.     Left side of head: No submental, preauricular or posterior auricular adenopathy.     Cervical: No cervical adenopathy.  Skin:    General: Skin is warm and dry.   Neurological:     Mental Status: He is alert.     GCS: GCS eye subscore is 4. GCS verbal subscore is 5. GCS motor subscore is 6.     Cranial Nerves: No cranial nerve deficit.     Sensory: No sensory deficit.  Psychiatric:        Speech: Speech normal.      ED Treatments / Results  Labs (all labs ordered are listed, but only abnormal results are displayed) Labs Reviewed  CULTURE, BLOOD (ROUTINE X 2)  CULTURE, BLOOD (ROUTINE X 2)  COMPREHENSIVE METABOLIC PANEL  BRAIN NATRIURETIC PEPTIDE  TROPONIN I (HIGH SENSITIVITY)  TROPONIN I (HIGH SENSITIVITY)  CBC WITH DIFFERENTIAL/PLATELET  LACTIC ACID, PLASMA    EKG    Radiology No results found.  Procedures Procedures (including critical care time) CRITICAL CARE Performed by: Ivery QualeHobson Regenia Erck Total critical care time: **30* minutes Critical care time was exclusive of separately billable procedures and treating other patients. Critical care was necessary to treat or prevent imminent or life-threatening deterioration. Critical care was time spent personally by me on the following activities: development of treatment plan with patient and/or surrogate as well as nursing, discussions with consultants, evaluation of patient's response to treatment, examination of patient, obtaining history from patient or surrogate, ordering and performing treatments and interventions, ordering and review of laboratory studies, ordering and review of radiographic studies, pulse  oximetry and re-evaluation of patient's condition.  Medications Ordered in ED Medications - No data to display   Initial Impression / Assessment and Plan / ED Course  I have reviewed the triage vital signs and the nursing notes.  Pertinent labs & imaging results that were available during my care of the patient were reviewed by me and considered in my medical decision making (see chart for details).          Final Clinical Impressions(s) / ED Diagnoses MDM  Blood  pressure is elevated at 172/103, respiratory rate is elevated at 26.  Heart rate is 94-1 06 and irregular.  Pulse oximetry is 96 to 97% on room air.  Patient complains of increased swelling of his legs.  He feels that his abdomen is also more swollen than usual.  He says that his shortness of breath is worse than what it has been being, he has to sit up in a chair.  Patient also complains that his leg is doing more drainage, and at times the drainage has color.  Initial troponin is elevated at 22.  The complete blood count is within normal limits.  No shift to the left.  Chest x-ray shows cardiomegaly with persistent vascular congestion.  B natruretic peptide is elevated at 303.  This is near the patient's usual range.  Case discussed with Dr. Adriana Simasook.  Patient seen with me by Dr. Adriana Simasook.  IV Lasix given. Was given IV antibiotics prophylactically because of the wound to the right leg. A second troponin was obtained and found to be elevated at 98.  We will discuss case with cardiology.  Review of previous records indicates the patient has had elevated troponins without acute myocardial infarctions in the past.  Case discussed with Dr Jenne CampusMcQueen. Pt can be admitted to this facility and treated for CHF and leg infection.  Case discussed with [facility]. Pt to be admitted.   Final diagnoses:  Acute on chronic combined systolic and diastolic congestive heart failure (HCC)  Cellulitis of right leg    ED Discharge Orders    None       Ivery QualeBryant, Sinda Leedom, Cordelia Poche-C 12/15/18 0016    Donnetta Hutchingook, Brian, MD 12/15/18 1707    Ivery QualeBryant, Shannel Zahm, PA-C 12/26/18 1650    Donnetta Hutchingook, Brian, MD 12/27/18 [phone]

## 2018-12-14 NOTE — ED Notes (Signed)
EDP at bedside. Informed EDP second troponin resulted and is 98.00.

## 2018-12-15 ENCOUNTER — Other Ambulatory Visit: Payer: Self-pay

## 2018-12-15 DIAGNOSIS — Z823 Family history of stroke: Secondary | ICD-10-CM | POA: Diagnosis not present

## 2018-12-15 DIAGNOSIS — Z9119 Patient's noncompliance with other medical treatment and regimen: Secondary | ICD-10-CM | POA: Diagnosis not present

## 2018-12-15 DIAGNOSIS — J449 Chronic obstructive pulmonary disease, unspecified: Secondary | ICD-10-CM | POA: Diagnosis not present

## 2018-12-15 DIAGNOSIS — E662 Morbid (severe) obesity with alveolar hypoventilation: Secondary | ICD-10-CM | POA: Diagnosis present

## 2018-12-15 DIAGNOSIS — Z87891 Personal history of nicotine dependence: Secondary | ICD-10-CM | POA: Diagnosis not present

## 2018-12-15 DIAGNOSIS — K219 Gastro-esophageal reflux disease without esophagitis: Secondary | ICD-10-CM | POA: Diagnosis present

## 2018-12-15 DIAGNOSIS — I5043 Acute on chronic combined systolic (congestive) and diastolic (congestive) heart failure: Secondary | ICD-10-CM | POA: Diagnosis not present

## 2018-12-15 DIAGNOSIS — E785 Hyperlipidemia, unspecified: Secondary | ICD-10-CM | POA: Diagnosis present

## 2018-12-15 DIAGNOSIS — Z8249 Family history of ischemic heart disease and other diseases of the circulatory system: Secondary | ICD-10-CM | POA: Diagnosis not present

## 2018-12-15 DIAGNOSIS — I5031 Acute diastolic (congestive) heart failure: Secondary | ICD-10-CM | POA: Diagnosis present

## 2018-12-15 DIAGNOSIS — Z6841 Body Mass Index (BMI) 40.0 and over, adult: Secondary | ICD-10-CM | POA: Diagnosis not present

## 2018-12-15 DIAGNOSIS — I252 Old myocardial infarction: Secondary | ICD-10-CM | POA: Diagnosis not present

## 2018-12-15 DIAGNOSIS — I130 Hypertensive heart and chronic kidney disease with heart failure and stage 1 through stage 4 chronic kidney disease, or unspecified chronic kidney disease: Secondary | ICD-10-CM | POA: Diagnosis present

## 2018-12-15 DIAGNOSIS — N400 Enlarged prostate without lower urinary tract symptoms: Secondary | ICD-10-CM | POA: Diagnosis present

## 2018-12-15 DIAGNOSIS — Z7901 Long term (current) use of anticoagulants: Secondary | ICD-10-CM | POA: Diagnosis not present

## 2018-12-15 DIAGNOSIS — Z9111 Patient's noncompliance with dietary regimen: Secondary | ICD-10-CM | POA: Diagnosis not present

## 2018-12-15 DIAGNOSIS — N182 Chronic kidney disease, stage 2 (mild): Secondary | ICD-10-CM | POA: Diagnosis present

## 2018-12-15 DIAGNOSIS — J9611 Chronic respiratory failure with hypoxia: Secondary | ICD-10-CM | POA: Diagnosis present

## 2018-12-15 DIAGNOSIS — L03115 Cellulitis of right lower limb: Secondary | ICD-10-CM | POA: Diagnosis not present

## 2018-12-15 DIAGNOSIS — Z20828 Contact with and (suspected) exposure to other viral communicable diseases: Secondary | ICD-10-CM | POA: Diagnosis present

## 2018-12-15 DIAGNOSIS — I480 Paroxysmal atrial fibrillation: Secondary | ICD-10-CM | POA: Diagnosis present

## 2018-12-15 LAB — CBC
HCT: 40.7 % (ref 39.0–52.0)
Hemoglobin: 12.6 g/dL — ABNORMAL LOW (ref 13.0–17.0)
MCH: 27.5 pg (ref 26.0–34.0)
MCHC: 31 g/dL (ref 30.0–36.0)
MCV: 88.7 fL (ref 80.0–100.0)
Platelets: 207 10*3/uL (ref 150–400)
RBC: 4.59 MIL/uL (ref 4.22–5.81)
RDW: 14.6 % (ref 11.5–15.5)
WBC: 3.6 10*3/uL — ABNORMAL LOW (ref 4.0–10.5)
nRBC: 0 % (ref 0.0–0.2)

## 2018-12-15 LAB — COMPREHENSIVE METABOLIC PANEL
ALT: 15 U/L (ref 0–44)
AST: 15 U/L (ref 15–41)
Albumin: 3.6 g/dL (ref 3.5–5.0)
Alkaline Phosphatase: 52 U/L (ref 38–126)
Anion gap: 9 (ref 5–15)
BUN: 11 mg/dL (ref 6–20)
CO2: 26 mmol/L (ref 22–32)
Calcium: 8.6 mg/dL — ABNORMAL LOW (ref 8.9–10.3)
Chloride: 105 mmol/L (ref 98–111)
Creatinine, Ser: 0.85 mg/dL (ref 0.61–1.24)
GFR calc Af Amer: >60 mL/min (ref >60)
GFR calc non Af Amer: >60 mL/min (ref >60)
Glucose, Bld: 94 mg/dL (ref 70–99)
Potassium: 3.4 mmol/L — ABNORMAL LOW (ref 3.5–5.1)
Sodium: 140 mmol/L (ref 135–145)
Total Bilirubin: 1 mg/dL (ref 0.3–1.2)
Total Protein: 6.9 g/dL (ref 6.5–8.1)

## 2018-12-15 LAB — TROPONIN I (HIGH SENSITIVITY)
Troponin I (High Sensitivity): 22 ng/L — ABNORMAL HIGH (ref <18)
Troponin I (High Sensitivity): 22 ng/L — ABNORMAL HIGH (ref <18)

## 2018-12-15 LAB — MRSA PCR SCREENING: MRSA by PCR: POSITIVE — AB

## 2018-12-15 IMAGING — CR DG CHEST 1V PORT
1 series · 1 of 1 positions shown · non-contrast
Comparison: 03/09/2017 chest radiograph

CLINICAL DATA: 50 y/o  M; chest pain and shortness of breath.

EXAM:
PORTABLE CHEST 1 VIEW

[ap]
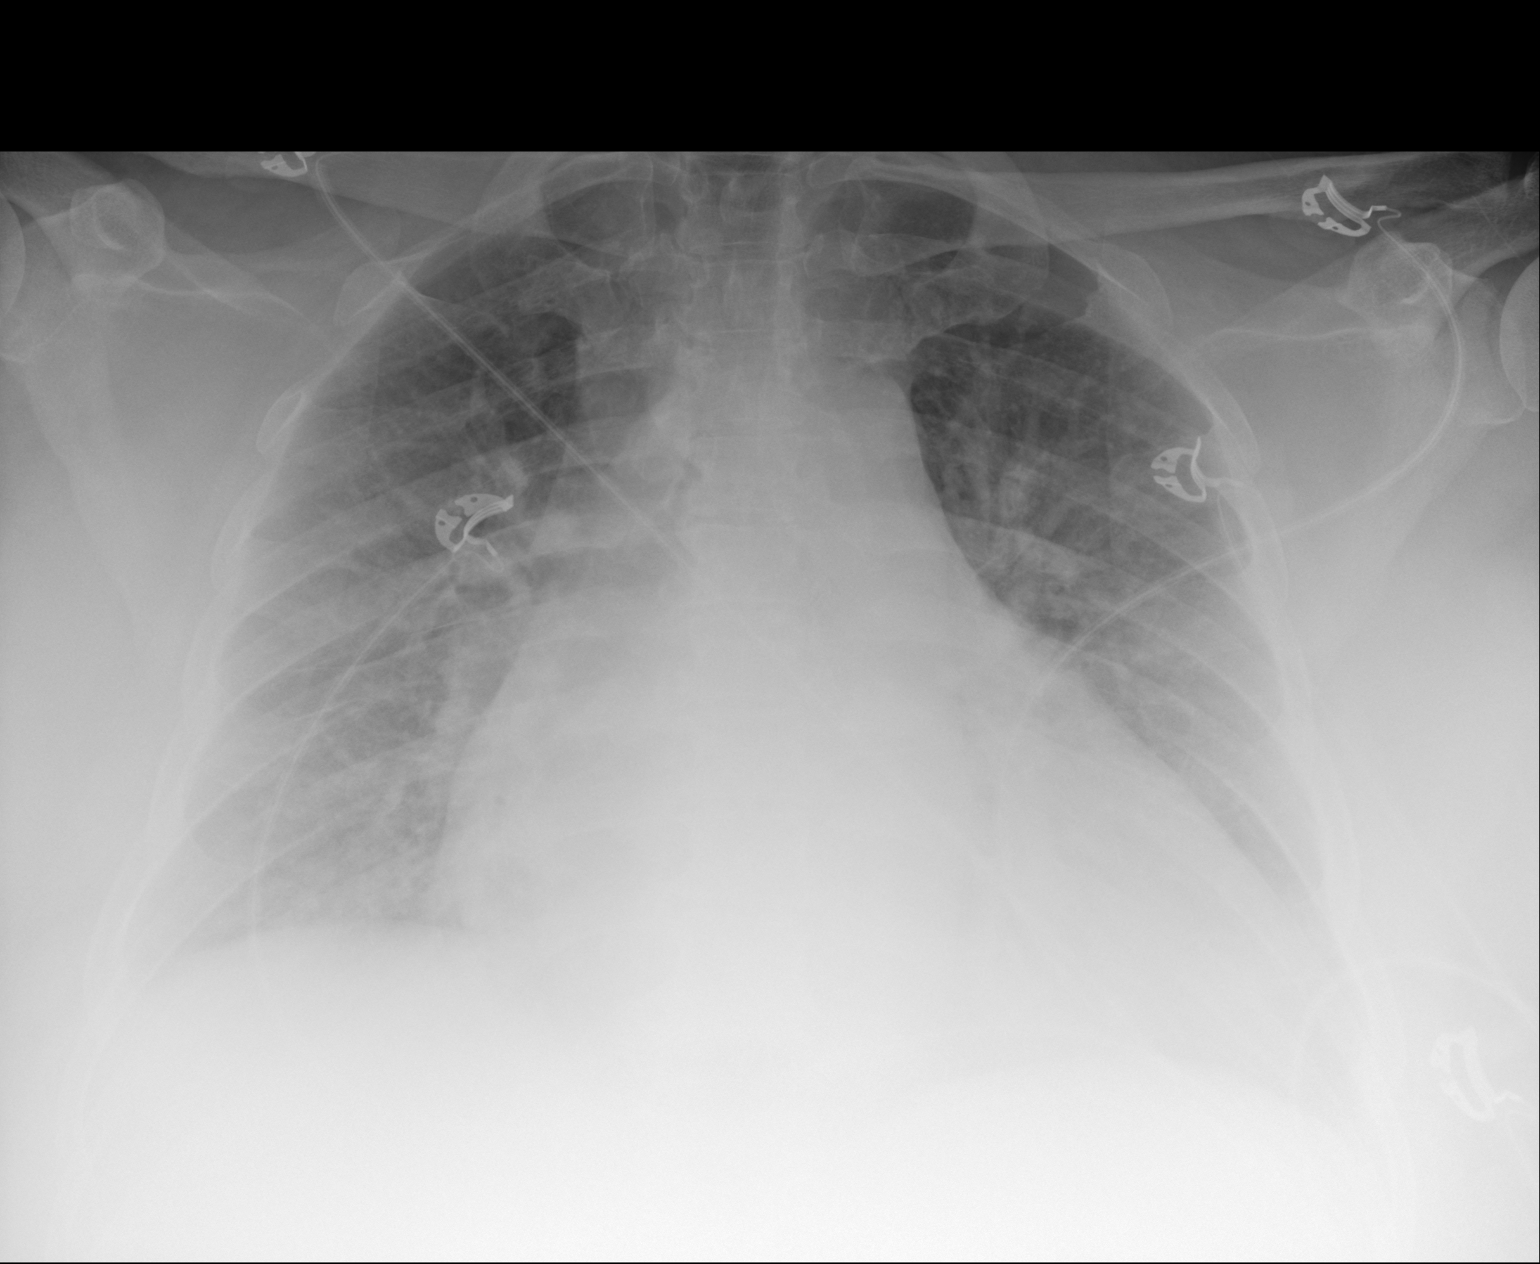

[1 of 1 positions shown; findings below may reference images not displayed]

FINDINGS: Stable cardiomegaly given projection and technique. Reticular and
hazy opacities of the lungs are increased from prior study. No
pleural effusion or pneumothorax. Bones are unremarkable.
IMPRESSION: Stable cardiomegaly. Interstitial and mild alveolar pulmonary edema.

By: French Ferrante M.D.

## 2018-12-15 MED ORDER — SODIUM CHLORIDE 0.9% FLUSH
3.0000 mL | Freq: Two times a day (BID) | INTRAVENOUS | Status: DC
  Administered 2018-12-15 – 2018-12-18 (×8): 3 mL via INTRAVENOUS

## 2018-12-15 MED ORDER — ATORVASTATIN CALCIUM 40 MG PO TABS
40.0000 mg | ORAL_TABLET | Freq: Every day | ORAL | Status: DC
  Administered 2018-12-15 – 2018-12-18 (×4): 40 mg via ORAL
  Filled 2018-12-15 (×4): qty 1

## 2018-12-15 MED ORDER — ALBUTEROL SULFATE (2.5 MG/3ML) 0.083% IN NEBU: 2.5000 mg | INHALATION_SOLUTION | Freq: Four times a day (QID) | RESPIRATORY_TRACT | Status: DC | PRN

## 2018-12-15 MED ORDER — VANCOMYCIN HCL 10 G IV SOLR
1750.0000 mg | Freq: Two times a day (BID) | INTRAVENOUS | Status: DC
  Filled 2018-12-15 (×3): qty 1750

## 2018-12-15 MED ORDER — CARVEDILOL 12.5 MG PO TABS
25.0000 mg | ORAL_TABLET | Freq: Two times a day (BID) | ORAL | Status: DC
  Administered 2018-12-15 – 2018-12-18 (×7): 25 mg via ORAL
  Filled 2018-12-15 (×7): qty 2

## 2018-12-15 MED ORDER — MUPIROCIN 2 % EX OINT
1.0000 "application " | TOPICAL_OINTMENT | Freq: Two times a day (BID) | CUTANEOUS | Status: DC
  Administered 2018-12-15 – 2018-12-18 (×5): 1 via NASAL
  Filled 2018-12-15 (×2): qty 22

## 2018-12-15 MED ORDER — ONDANSETRON HCL 4 MG/2ML IJ SOLN: 4.0000 mg | Freq: Four times a day (QID) | INTRAMUSCULAR | Status: DC | PRN

## 2018-12-15 MED ORDER — DOCUSATE SODIUM 100 MG PO CAPS
100.0000 mg | ORAL_CAPSULE | Freq: Every morning | ORAL | Status: DC
  Administered 2018-12-15 – 2018-12-18 (×4): 100 mg via ORAL
  Filled 2018-12-15 (×4): qty 1

## 2018-12-15 MED ORDER — RIVAROXABAN 20 MG PO TABS
20.0000 mg | ORAL_TABLET | Freq: Every day | ORAL | Status: DC
  Administered 2018-12-15 – 2018-12-17 (×3): 20 mg via ORAL
  Filled 2018-12-15 (×3): qty 1

## 2018-12-15 MED ORDER — LISINOPRIL 10 MG PO TABS
10.0000 mg | ORAL_TABLET | Freq: Every day | ORAL | Status: DC
  Administered 2018-12-15 – 2018-12-18 (×4): 10 mg via ORAL
  Filled 2018-12-15 (×4): qty 1

## 2018-12-15 MED ORDER — ORAL CARE MOUTH RINSE
15.0000 mL | Freq: Two times a day (BID) | OROMUCOSAL | Status: DC
  Administered 2018-12-15 – 2018-12-18 (×5): 15 mL via OROMUCOSAL

## 2018-12-15 MED ORDER — CHLORHEXIDINE GLUCONATE CLOTH 2 % EX PADS
6.0000 | MEDICATED_PAD | Freq: Every day | CUTANEOUS | Status: DC
  Administered 2018-12-17 – 2018-12-18 (×2): 6 via TOPICAL

## 2018-12-15 MED ORDER — ONDANSETRON HCL 4 MG PO TABS: 4.0000 mg | ORAL_TABLET | Freq: Four times a day (QID) | ORAL | Status: DC | PRN

## 2018-12-15 MED ORDER — TAMSULOSIN HCL 0.4 MG PO CAPS
0.4000 mg | ORAL_CAPSULE | Freq: Every day | ORAL | Status: DC
  Administered 2018-12-15 – 2018-12-18 (×4): 0.4 mg via ORAL
  Filled 2018-12-15 (×4): qty 1

## 2018-12-15 MED ORDER — VANCOMYCIN HCL IN DEXTROSE 750-5 MG/150ML-% IV SOLN
750.0000 mg | INTRAVENOUS | Status: AC
  Administered 2018-12-15 (×2): 750 mg via INTRAVENOUS
  Filled 2018-12-15 (×2): qty 150

## 2018-12-15 MED ORDER — SPIRONOLACTONE 25 MG PO TABS
100.0000 mg | ORAL_TABLET | Freq: Every day | ORAL | Status: DC
  Administered 2018-12-15 – 2018-12-18 (×4): 100 mg via ORAL
  Filled 2018-12-15 (×2): qty 4
  Filled 2018-12-15: qty 1
  Filled 2018-12-15: qty 4

## 2018-12-15 MED ORDER — PANTOPRAZOLE SODIUM 40 MG PO TBEC
40.0000 mg | DELAYED_RELEASE_TABLET | Freq: Every day | ORAL | Status: DC
  Administered 2018-12-15 – 2018-12-18 (×4): 40 mg via ORAL
  Filled 2018-12-15 (×4): qty 1

## 2018-12-15 MED ORDER — POTASSIUM CHLORIDE CRYS ER 20 MEQ PO TBCR
40.0000 meq | EXTENDED_RELEASE_TABLET | Freq: Two times a day (BID) | ORAL | Status: DC
  Administered 2018-12-15 – 2018-12-18 (×8): 40 meq via ORAL
  Filled 2018-12-15 (×8): qty 2

## 2018-12-15 MED ORDER — FUROSEMIDE 10 MG/ML IJ SOLN
60.0000 mg | Freq: Two times a day (BID) | INTRAMUSCULAR | Status: DC
  Administered 2018-12-15 – 2018-12-17 (×5): 60 mg via INTRAVENOUS
  Filled 2018-12-15 (×5): qty 6

## 2018-12-15 MED ORDER — FLUTICASONE PROPIONATE HFA 220 MCG/ACT IN AERO: 2.0000 | INHALATION_SPRAY | Freq: Every day | RESPIRATORY_TRACT | Status: DC | PRN

## 2018-12-15 MED ORDER — DOXYCYCLINE HYCLATE 100 MG PO TABS
100.0000 mg | ORAL_TABLET | Freq: Two times a day (BID) | ORAL | Status: DC
  Administered 2018-12-15 – 2018-12-18 (×7): 100 mg via ORAL
  Filled 2018-12-15 (×7): qty 1

## 2018-12-15 MED ORDER — FERROUS GLUCONATE 324 (38 FE) MG PO TABS
324.0000 mg | ORAL_TABLET | Freq: Two times a day (BID) | ORAL | Status: DC
  Administered 2018-12-15 – 2018-12-18 (×7): 324 mg via ORAL
  Filled 2018-12-15 (×9): qty 1

## 2018-12-15 MED ORDER — SODIUM CHLORIDE 0.9% FLUSH: 3.0000 mL | INTRAVENOUS | Status: DC | PRN

## 2018-12-15 MED ORDER — OXYCODONE-ACETAMINOPHEN 5-325 MG PO TABS
1.0000 | ORAL_TABLET | Freq: Four times a day (QID) | ORAL | Status: DC | PRN
  Administered 2018-12-16: 1 via ORAL
  Filled 2018-12-15: qty 1

## 2018-12-15 MED ORDER — BUDESONIDE 0.5 MG/2ML IN SUSP: 0.5000 mg | Freq: Two times a day (BID) | RESPIRATORY_TRACT | Status: DC | PRN

## 2018-12-15 MED ORDER — SODIUM CHLORIDE 0.9 % IV SOLN
250.0000 mL | INTRAVENOUS | Status: DC | PRN
  Administered 2018-12-15: 03:00:00 250 mL via INTRAVENOUS

## 2018-12-15 MED ORDER — CHLORHEXIDINE GLUCONATE CLOTH 2 % EX PADS
6.0000 | MEDICATED_PAD | Freq: Every day | CUTANEOUS | Status: DC
  Administered 2018-12-15 – 2018-12-18 (×4): 6 via TOPICAL

## 2018-12-15 NOTE — Progress Notes (Signed)
Pharmacy Antibiotic Note  [name] is a 53 y.o. male admitted on 12/14/2018 with cellulitis.  Pharmacy has been consulted for vancomycin dosing.  Plan: Vancomycin 750 mg IV every hour x 2 doses to be given in addition to previously administered 1000 mg dose to complete a total loading dose of 2500 mg.  AP pharmacist to follow up scheduled dosing.  Monitor clinical progress, cultures/sensitivities, renal function, abx plan, Vancomycin levels as indicated   Height: 6' (182.9 cm) Weight: (!) 369 lb 14.9 oz (167.8 kg) IBW/kg (Calculated) : 77.6  Temp (24hrs), Avg:98.5 F (36.9 C), Min:98.1 F (36.7 C), Max:98.8 F (37.1 C)  Recent Labs  Lab 12/08/18 0307 12/08/18 0559 12/09/18 0548 12/14/18 1840 12/14/18 1911  WBC  --   --  4.4 4.6  --   CREATININE  --   --  1.06 0.98  --   LATICACIDVEN 0.7 0.8  --   --  0.9    Estimated Creatinine Clearance: 145 mL/min (by C-G formula based on SCr of 0.98 mg/dL).    Allergies  Allergen Reactions  . Banana Shortness Of Breath  . Bee Venom Shortness Of Breath, Swelling and Other (See Comments)    Reaction:  Facial swelling  . Strawberry Flavor Shortness Of Breath and Rash  . Aspirin Other (See Comments)    Reaction:  GI upset   . Metolazone Other (See Comments)    Pt states that he stopped taking this med due to heart attack like symptoms.   . Oatmeal Nausea And Vomiting  . Orange Juice [Orange Oil] Nausea And Vomiting    All acidic products make him nauseous and upset stomach  . Torsemide Swelling and Other (See Comments)    Reaction:  Swelling of feet/legs   . Diltiazem Palpitations  . Hydralazine Palpitations  . Lipitor [Atorvastatin] Other (See Comments)    Reaction:  Nose bleeds : Patient is currently taking this medication    Antimicrobials this admission: 6/24 vancomycin >>  6/24 ceftriaxone x 1  Dose adjustments this admission:  Microbiology results: 6/24 BCx: pending 6/24 COVID: negative 6/25 MRSA PCR:  sent   Thank you for allowing [place] to participate in this patients care.   Jens Som, PharmD 12/15/2018 2:23 AM

## 2018-12-15 NOTE — Progress Notes (Addendum)
[name] has been admitted again with decompensated heart failure due to medication and dietary noncompliance.  This is a repetitive issue for him.  Medications have been explained in great deal to him by myself, additional cardiac providers, as well as [name].  Unfortunately, he continues to do what he wants.  Please refer to her detailed patient outreach note on 12/07/2018.  Troponin elevation is secondary to decompensated heart failure.  He had normal coronary arteries by coronary angiography in December 2016.   I have no additional recommendations.

## 2018-12-15 NOTE — H&P (Addendum)
TRH H&P    Patient Demographics:    [name], is a 53 y.o. male  MRN: [id number]  DOB - 06/16/1966  Admit Date - 12/14/2018  Referring MD/NP/PA: Ivery QualeHobson Bryant  Outpatient Primary MD for the patient is Pearson GrippeKim, [name], MD  Patient coming from: Home  Chief complaint-leg swelling   HPI:    Samuel HewsMark Fitzpatrick  is a 53 y.o. male, with history of morbid obesity, hypertension, chronic leg edema, chronic combined diastolic and systolic CHF, atrial fibrillation on anticoagulation with Xarelto, sleep apnea on CPAP who was recently discharged from the hospital on 12/09/2018 after he was admitted for acute on chronic combined systolic and diastolic CHF and lower extremity cellulitis.  Patient was discharged doxycycline. Patient says that the leg pain and swelling has not improved with antibiotics.  He continued to complain of right lower extremity swelling with watery discharge from the right leg. He denies fever or chills. Patient also complains of intermittent chest pain along with shortness of breath for past 2 days.  Patient does have chronic combined Solik and diastolic CHF and takes Bumex 2 mg 3 times a day. He denies nausea vomiting or diarrhea.  EKG showed nonspecific T wave abnormalities in lateral leads. Denies fever or chills. Denies contact with COVID-19 patients. SARS-CoV-2 is negative in the ED.    Review of systems:    In addition to the HPI above,    All other systems reviewed and are negative.    Past History of the following :    Past Medical History:  Diagnosis Date  . Allergic rhinitis   . Asthma   . Atrial fibrillation (HCC)   . CHF (congestive heart failure) (HCC)   . Elevated troponin level not due myocardial infarction 06/13/2015   cath w/ nl cors, felt 2nd HTN, CHF, peat trop 2.73  . Essential hypertension   . Gastroesophageal reflux disease   . History of cardiac catheterization    Normal coronaries December 2016  . Noncompliance    Major problem leading to declining health and recurrent hospitalization  . Nonischemic cardiomyopathy (HCC)    a. LVEF 20-25% in 2017 with cath in 2016 showing normal cors. b. EF 55% by echo in 03/2018.  . On home O2    3L N/C  . OSA (obstructive sleep apnea)   . Osteoarthritis   . Peptic ulcer disease       Past Surgical History:  Procedure Laterality Date  . CARDIAC CATHETERIZATION N/A 06/14/2015   Procedure: Right/Left Heart Cath and Coronary Angiography;  Surgeon: Runell GessJonathan J Berry, MD;  [facility];  Service: Cardiovascular;  Laterality: N/A;      Social History:      Social History   Tobacco Use  . Smoking status: Former Smoker    Packs/day: 0.50    Years: 20.00    Pack years: 10.00    Types: Cigarettes    Start date: 04/26/1988    Quit date: 06/23/2007    Years since quitting: 11.4  . Smokeless tobacco: Never Used  .  Tobacco comment: 1 ppd former smoker  Substance Use Topics  . Alcohol use: Not Currently    Alcohol/week: 0.0 standard drinks    Comment: No etoh since 2009       Family History :     Family History  Problem Relation Age of Onset  . Stroke Father   . Heart attack Father   . Aneurysm Mother        Cerebral aneurysm  . Hypertension Sister   . Colon cancer Neg Hx   . Inflammatory bowel disease Neg Hx   . Liver disease Neg Hx       Home Medications:   Prior to Admission medications   Medication Sig Start Date End Date Taking? Authorizing Provider  acetaminophen (TYLENOL) 325 MG tablet Take 2 tablets (650 mg total) by mouth every 4 (four) hours as needed for headache or mild pain. 11/02/18  Yes Calvert Cantor, MD  albuterol (PROVENTIL) (2.5 MG/3ML) 0.083% nebulizer solution Take 2.5 mg by nebulization every 6 (six) hours as needed for wheezing or shortness of breath.   Yes [provider]  atorvastatin (LIPITOR) 40 MG tablet Take 40 mg by mouth daily.  09/05/18  Yes  [provider]  bumetanide (BUMEX) 2 MG tablet Take 1 tablet (2 mg total) by mouth 3 (three) times daily. 12/09/18  Yes Rolly Salter, MD  carvedilol (COREG) 25 MG tablet Take 1 tablet (25 mg total) by mouth 2 (two) times daily with a meal for 30 days. 08/18/18 11/04/19 Yes Shah, Pratik D, DO  docusate sodium (COLACE) 100 MG capsule Take 1 capsule (100 mg total) by mouth 2 (two) times daily. Patient taking differently: Take 100 mg by mouth every morning.  06/25/16  Yes Kathlen Mody, MD  ferrous gluconate (FERGON) 324 MG tablet Take 1 tablet (324 mg total) by mouth 2 (two) times daily with a meal. Patient taking differently: Take 324 mg by mouth daily.  06/25/16  Yes Kathlen Mody, MD  fluticasone (FLOVENT HFA) 220 MCG/ACT inhaler Inhale 2 puffs into the lungs daily as needed (for shortness of breath).    Yes [provider]  lisinopril (ZESTRIL) 10 MG tablet Take 10 mg by mouth daily. 11/22/18  Yes [provider]  oxyCODONE-acetaminophen (PERCOCET/ROXICET) 5-325 MG tablet Take 1 tablet by mouth every 6 (six) hours as needed. 11/30/18  Yes Bethann Berkshire, MD  pantoprazole (PROTONIX) 40 MG tablet Take 1 tablet (40 mg total) by mouth daily at 6 (six) AM. 06/26/16  Yes Kathlen Mody, MD  potassium chloride SA (K-DUR) 20 MEQ tablet Take 2 tablets (40 mEq total) by mouth 2 (two) times daily. 10/07/18  Yes Vassie Loll, MD  rivaroxaban (XARELTO) 20 MG TABS tablet Take 1 tablet (20 mg total) by mouth daily with breakfast. Patient taking differently: Take 20 mg by mouth daily with supper.  06/26/16  Yes Kathlen Mody, MD  silver sulfADIAZINE (SILVADENE) 1 % cream Apply 1 application topically daily. 11/28/18  Yes Maxwell Caul, PA-C  spironolactone (ALDACTONE) 100 MG tablet Take 100 mg by mouth daily. For fluid 03/18/17  Yes [provider]  tamsulosin (FLOMAX) 0.4 MG CAPS capsule Take 1 capsule (0.4 mg total) by mouth daily. 06/25/16  Yes Kathlen Mody, MD  potassium chloride 20  MEQ TBCR Take 20 mEq by mouth 2 (two) times daily. 09/03/15 11/04/18  Ward, Layla Maw, DO     Allergies:     Allergies  Allergen Reactions  . Banana Shortness Of Breath  . Bee  Venom Shortness Of Breath, Swelling and Other (See Comments)    Reaction:  Facial swelling  . Strawberry Flavor Shortness Of Breath and Rash  . Aspirin Other (See Comments)    Reaction:  GI upset   . Metolazone Other (See Comments)    Pt states that he stopped taking this med due to heart attack like symptoms.   . Oatmeal Nausea And Vomiting  . Orange Juice [Orange Oil] Nausea And Vomiting    All acidic products make him nauseous and upset stomach  . Torsemide Swelling and Other (See Comments)    Reaction:  Swelling of feet/legs   . Diltiazem Palpitations  . Hydralazine Palpitations  . Lipitor [Atorvastatin] Other (See Comments)    Reaction:  Nose bleeds : Patient is currently taking this medication     Physical Exam:   Vitals  Blood pressure 135/85, pulse (!) 47, temperature 98.8 F (37.1 C), temperature source Oral, resp. rate (!) 26, height 6' (1.829 m), weight (!) 170.1 kg, SpO2 94 %.  1.  General: Appears in no acute distress  2. Psychiatric: Alert, oriented x3, intact insight and judgment  3. Neurologic: Cranial nerves II through XII grossly intact, motor strength 5/5 in all extremities  4. HEENMT:  Atraumatic normocephalic, extraocular muscles intact, oral mucosa is pink and moist  5. Respiratory : Decreased breath sounds at lung bases  6. Cardiovascular : S1-S2, regular, no murmur auscultated  7. Gastrointestinal:  Abdomen is soft, nontender, no organomegaly  8. Skin:  Brawny induration of right lower extremity with weeping wounds noted, warm to touch, erythema could not be appreciated      Data Review:    CBC Recent Labs  Lab 12/09/18 0548 12/14/18 1840  WBC 4.4 4.6  HGB 12.0* 12.7*  HCT 38.8* 41.2  PLT 241 224  MCV 88.8 89.0  MCH 27.5 27.4  MCHC 30.9 30.8  RDW  14.6 14.7  LYMPHSABS  --  1.1  MONOABS  --  0.5  EOSABS  --  0.1  BASOSABS  --  0.0   ------------------------------------------------------------------------------------------------------------------  Results for orders placed or performed during the hospital encounter of 12/14/18 (from the past 48 hour(s))  Comprehensive metabolic panel     Status: Abnormal   Collection Time: 12/14/18  6:40 PM  Result Value Ref Range   Sodium 136 135 - 145 mmol/L   Potassium 3.9 3.5 - 5.1 mmol/L   Chloride 104 98 - 111 mmol/L   CO2 23 22 - 32 mmol/L   Glucose, Bld 100 (H) 70 - 99 mg/dL   BUN 13 6 - 20 mg/dL   Creatinine, Ser 1.610.98 0.61 - 1.24 mg/dL   Calcium 8.4 (L) 8.9 - 10.3 mg/dL   Total Protein 7.3 6.5 - 8.1 g/dL   Albumin 3.8 3.5 - 5.0 g/dL   AST 19 15 - 41 U/L   ALT 16 0 - 44 U/L   Alkaline Phosphatase 64 38 - 126 U/L   Total Bilirubin 0.7 0.3 - 1.2 mg/dL   GFR calc non Af Amer >60 >60 mL/min   GFR calc Af Amer >60 >60 mL/min   Anion gap 9 5 - 15    Comment: Performed at [facility], [address] [place], [place] [address]  Brain natriuretic peptide     Status: Abnormal   Collection Time: 12/14/18  6:40 PM  Result Value Ref Range   B Natriuretic Peptide 303.0 (H) 0.0 - 100.0 pg/mL    Comment: Performed at [facility],  [address] [place], [place] [address]  Troponin I (High Sensitivity)     Status: Abnormal   Collection Time: 12/14/18  6:40 PM  Result Value Ref Range   Troponin I (High Sensitivity) 22.00 (H) <18 ng/L    Comment: (NOTE) Elevated high sensitivity troponin I (hsTnI) values and significant  changes across serial measurements may suggest ACS but many other  chronic and acute conditions are known to elevate hsTnI results.  Refer to the "Links" section for chest pain algorithms and additional  guidance. Performed at [facility], [address] [place], [place] [address]   CBC with Differential     Status: Abnormal   Collection Time: 12/14/18  6:40 PM  Result  Value Ref Range   WBC 4.6 4.0 - 10.5 K/uL   RBC 4.63 4.22 - 5.81 MIL/uL   Hemoglobin 12.7 (L) 13.0 - 17.0 g/dL   HCT 09.841.2 11.939.0 - 14.752.0 %   MCV 89.0 80.0 - 100.0 fL   MCH 27.4 26.0 - 34.0 pg   MCHC 30.8 30.0 - 36.0 g/dL   RDW 82.914.7 56.211.5 - 13.015.5 %   Platelets 224 150 - 400 K/uL   nRBC 0.0 0.0 - 0.2 %   Neutrophils Relative % 64 %   Neutro Abs 2.9 1.7 - 7.7 K/uL   Lymphocytes Relative 24 %   Lymphs Abs 1.1 0.7 - 4.0 K/uL   Monocytes Relative 11 %   Monocytes Absolute 0.5 0.1 - 1.0 K/uL   Eosinophils Relative 1 %   Eosinophils Absolute 0.1 0.0 - 0.5 K/uL   Basophils Relative 0 %   Basophils Absolute 0.0 0.0 - 0.1 K/uL   Immature Granulocytes 0 %   Abs Immature Granulocytes 0.01 0.00 - 0.07 K/uL    Comment: Performed at [facility], [address] [place], [place] [address]  D-dimer, quantitative (not at [facility])     Status: None   Collection Time: 12/14/18  6:40 PM  Result Value Ref Range   D-Dimer, Quant 0.37 0.00 - 0.50 ug/mL-FEU    Comment: (NOTE) At the manufacturer cut-off of 0.50 ug/mL FEU, this assay has been documented to exclude PE with a sensitivity and negative predictive value of 97 to 99%.  At this time, this assay has not been approved by the FDA to exclude DVT/VTE. Results should be correlated with clinical presentation. Performed at [facility], [address] [place], [place] [address]   Blood culture (routine x 2)     Status: None (Preliminary result)   Collection Time: 12/14/18  7:11 PM   Specimen: Right Antecubital; Blood  Result Value Ref Range   Specimen Description RIGHT ANTECUBITAL    Special Requests      BOTTLES DRAWN AEROBIC AND ANAEROBIC Blood Culture adequate volume Performed at [facility], [address] [place], [place] [address]    Culture PENDING    Report Status PENDING   Blood culture (routine x 2)     Status: None (Preliminary result)   Collection Time: 12/14/18  7:11 PM   Specimen: Right Antecubital; Blood  Result Value Ref Range    Specimen Description BLOOD LEFT HAND    Special Requests      BOTTLES DRAWN AEROBIC AND ANAEROBIC Blood Culture adequate volume Performed at [facility], [address] [place], [place] [address]    Culture PENDING    Report Status PENDING   Lactic acid, plasma     Status: None   Collection Time: 12/14/18  7:11 PM  Result Value  Ref Range   Lactic Acid, Venous 0.9 0.5 - 1.9 mmol/L    Comment: Performed at [facility], [address], [place], [place] [address]  Troponin I (High Sensitivity)     Status: Abnormal   Collection Time: 12/14/18  8:10 PM  Result Value Ref Range   Troponin I (High Sensitivity) 98.00 (H) <18 ng/L    Comment: DELTA CHECK NOTED (NOTE) Elevated high sensitivity troponin I (hsTnI) values and significant  changes across serial measurements may suggest ACS but many other  chronic and acute conditions are known to elevate hsTnI results.  Refer to the Links section for chest pain algorithms and additional  guidance. Performed at [facility], [address], [place], [place] [address]   SARS Coronavirus 2 (CEPHEID - Performed in [facility] hospital lab), Hosp Order     Status: None   Collection Time: 12/14/18  9:34 PM   Specimen: Nasopharyngeal Swab  Result Value Ref Range   SARS Coronavirus 2 NEGATIVE NEGATIVE    Comment: (NOTE) If result is NEGATIVE SARS-CoV-2 target nucleic acids are NOT DETECTED. The SARS-CoV-2 RNA is generally detectable in upper and lower  respiratory specimens during the acute phase of infection. The lowest  concentration of SARS-CoV-2 viral copies this assay can detect is 250  copies / mL. A negative result does not preclude SARS-CoV-2 infection  and should not be used as the sole basis for treatment or other  patient management decisions.  A negative result may occur with  improper specimen collection / handling, submission of specimen other  than nasopharyngeal swab, presence of viral mutation(s) within the  areas targeted by this  assay, and inadequate number of viral copies  (<250 copies / mL). A negative result must be combined with clinical  observations, patient history, and epidemiological information. If result is POSITIVE SARS-CoV-2 target nucleic acids are DETECTED. The SARS-CoV-2 RNA is generally detectable in upper and lower  respiratory specimens dur ing the acute phase of infection.  Positive  results are indicative of active infection with SARS-CoV-2.  Clinical  correlation with patient history and other diagnostic information is  necessary to determine patient infection status.  Positive results do  not rule out bacterial infection or co-infection with other viruses. If result is PRESUMPTIVE POSTIVE SARS-CoV-2 nucleic acids MAY BE PRESENT.   A presumptive positive result was obtained on the submitted specimen  and confirmed on repeat testing.  While 2019 novel coronavirus  (SARS-CoV-2) nucleic acids may be present in the submitted sample  additional confirmatory testing may be necessary for epidemiological  and / or clinical management purposes  to differentiate between  SARS-CoV-2 and other Sarbecovirus currently known to infect humans.  If clinically indicated additional testing with an alternate test  methodology [phone]) is advised. The SARS-CoV-2 RNA is generally  detectable in upper and lower respiratory sp ecimens during the acute  phase of infection. The expected result is Negative. Fact Sheet for Patients:  BoilerBrush.com.cy Fact Sheet for Healthcare Providers: https://pope.com/ This test is not yet approved or cleared by the [place] FDA and has been authorized for detection and/or diagnosis of SARS-CoV-2 by FDA under an Emergency Use Authorization (EUA).  This EUA will remain in effect (meaning this test can be used) for the duration of the COVID-19 declaration under Section 564(b)(1) of the Act, 21 U.S.C. section 360bbb-3(b)(1),  unless the authorization is terminated or revoked sooner. Performed at [facility], [address], [place], [place] [address]     Chemistries  Recent Labs  Lab 12/09/18 0548 12/14/18 1840  NA 141 136  K 3.9 3.9  CL 107 104  CO2 26 23  GLUCOSE 90 100*  BUN 14 13  CREATININE 1.06 0.98  CALCIUM 8.5* 8.4*  AST 17 19  ALT 13 16  ALKPHOS 46 64  BILITOT 1.1 0.7   ------------------------------------------------------------------------------------------------------------------  ------------------------------------------------------------------------------------------------------------------ GFR: Estimated Creatinine Clearance: 146.2 mL/min (by C-G formula based on SCr of 0.98 mg/dL). Liver Function Tests: Recent Labs  Lab 12/09/18 0548 12/14/18 1840  AST 17 19  ALT 13 16  ALKPHOS 46 64  BILITOT 1.1 0.7  PROT 6.7 7.3  ALBUMIN 3.3* 3.8   No results for input(s): LIPASE, AMYLASE in the last 168 hours. No results for input(s): AMMONIA in the last 168 hours. Coagulation Profile: No results for input(s): INR, PROTIME in the last 168 hours. Cardiac Enzymes: Recent Labs  Lab 12/08/18 0559 12/08/18 1217 12/08/18 1830  TROPONINI <0.03 <0.03 0.03*   BNP (last 3 results) No results for input(s): PROBNP in the last 8760 hours. HbA1C: No results for input(s): HGBA1C in the last 72 hours. CBG: No results for input(s): GLUCAP in the last 168 hours. Lipid Profile: No results for input(s): CHOL, HDL, LDLCALC, TRIG, CHOLHDL, LDLDIRECT in the last 72 hours. Thyroid Function Tests: No results for input(s): TSH, T4TOTAL, FREET4, T3FREE, THYROIDAB in the last 72 hours. Anemia Panel: No results for input(s): VITAMINB12, FOLATE, FERRITIN, TIBC, IRON, RETICCTPCT in the last 72 hours.  --------------------------------------------------------------------------------------------------------------- Urine analysis:    Component Value Date/Time   COLORURINE YELLOW 04/01/2018 0332    APPEARANCEUR CLEAR 04/01/2018 0332   LABSPEC 1.008 04/01/2018 0332   PHURINE 7.0 04/01/2018 0332   GLUCOSEU NEGATIVE 04/01/2018 0332   GLUCOSEU NEG mg/dL 74/25/9563 8756   HGBUR NEGATIVE 04/01/2018 0332   BILIRUBINUR NEGATIVE 04/01/2018 0332   KETONESUR NEGATIVE 04/01/2018 0332   PROTEINUR NEGATIVE 04/01/2018 0332   UROBILINOGEN 0.2 08/08/2014 1445   NITRITE NEGATIVE 04/01/2018 0332   LEUKOCYTESUR NEGATIVE 04/01/2018 0332      Imaging Results:    Dg Chest Portable 1 View  Result Date: 12/14/2018 CLINICAL DATA:  Shortness of breath EXAM: PORTABLE CHEST 1 VIEW COMPARISON:  December 08, 2018 FINDINGS: The heart is significantly enlarged. There is evidence of vascular congestion and pulmonary edema. No pneumothorax. There may be small bilateral pleural effusions. Evaluation is limited by patient body habitus. IMPRESSION: Cardiomegaly with persistent vascular congestion. Electronically Signed   By: Katherine Mantle M.D.   On: 12/14/2018 19:31      Assessment & Plan:    Active Problems:   Acute diastolic CHF (congestive heart failure) (HCC)   1. Acute on chronic combined systolic and diastolic CHF-we will start Lasix 60 mg IV every 12 hours.  Patient received 40 mg Lasix in the ED.  Check BMP in a.m.  Daily weights.  Intake and output.  2. Right leg cellulitis-patient has failed outpatient treatment with doxycycline.  Started on vancomycin in the ED.  We will continue vancomycin per pharmacy consultation.  3. Elevated troponin-troponin went up from 22.0 -  98.0.  Called and discussed with cardiology Dr. Shirlee Latch, who recommends to continue with Xarelto. Consult cardiology in a.m.  Cycle troponin.  This likely from demand ischemia from CHF exacerbation as above.  4. Hypertension-continue Coreg 25 mg p.o. twice daily, Aldactone 100 mg p.o. daily.  5. Paroxysmal atrial fibrillation-heart rate is controlled, continue Coreg, continue anticoagulation with Xarelto.   DVT Prophylaxis-Xarelto   AM Labs Ordered, also please  review Full Orders  Family Communication: Admission, patients condition and plan of care including tests being ordered have been discussed with the patient  who indicate understanding and agree with the plan and Code Status.  Code Status: Full code  Admission status: Inpatient: Based on patients clinical presentation and evaluation of above clinical data, I have made determination that patient meets Inpatient criteria at this time.  Time spent in minutes : 60 minutes   Oswald Hillock M.D on 12/15/2018 at 1:24 AM

## 2018-12-15 NOTE — Progress Notes (Signed)
Patient seen and examined. Admitted secondary to acute on chronic combined HF exacerbation. Patient well known for medication and diet non-compliance. Currently denying CP. Goo urine output appreciated with use of IV lasix. No pain, warm sensation or open wounds on his RLE, oozing appreciated from fluid overload and chronic stasis dermatitis. Please refer to H&P written by Dr. Darrick Meigs for further info/details on admission.   Plan: -continue IV diuresis -follow daily weight and low sodium diet -strict I's and O's -follow clinical response. -stop vancomycin and complete treatment of superficial skin infection with doxycycline. -follow renal function trend   Barton Dubois MD [phone]

## 2018-12-15 NOTE — Progress Notes (Signed)
Patient refused CPAP. No unit in room at this time. 

## 2018-12-15 NOTE — TOC Initial Note (Signed)
Transition of Care [facility]) - Initial/Assessment Note    Patient Details  Name: [name] MRN: [id number] Date of Birth: [birth year]  Transition of Care [facility]) CM/SW Contact:    Ihor Gully, LCSW Phone Number: 12/15/2018, 3:22 PM  Clinical Narrative:                  Patient is well known to TOC due to repeated admissions. Patient is active with [facility] and [facility].  Patient has history of low compliance with medications, diet, and overall functioning. TOC will follow and address needs as they arise.    Expected Discharge Plan: [place] Barriers to Discharge: No Barriers Identified   Patient Goals and CMS Choice Patient states their goals for this hospitalization and ongoing recovery are:: To return home CMS Medicare.gov Compare Post Acute Care list provided to:: Patient Choice offered to / list presented to : Patient  Expected Discharge Plan and Services Expected Discharge Plan: [place]       Living arrangements for the past 2 months: Single Family Home                                      Prior Living Arrangements/Services Living arrangements for the past 2 months: Single Family Home Lives with:: Parents Patient language and need for interpreter reviewed:: Yes        Need for Family Participation in Patient Care: Yes (Comment) Care giver support system in place?: Yes (comment) Current home services: Home RN Criminal Activity/Legal Involvement Pertinent to Current Situation/Hospitalization: No - Comment as needed  Activities of Daily Living Home Assistive Devices/Equipment: Oxygen, Hospital bed ADL Screening (condition at time of admission) Patient's cognitive ability adequate to safely complete daily activities?: Yes Is the patient deaf or have difficulty hearing?: No Does the patient have difficulty seeing, even when wearing glasses/contacts?: Yes Does the patient have difficulty concentrating, remembering,  or making decisions?: No Patient able to express need for assistance with ADLs?: Yes Does the patient have difficulty dressing or bathing?: No Independently performs ADLs?: Yes (appropriate for developmental age) Does the patient have difficulty walking or climbing stairs?: Yes Weakness of Legs: Right Weakness of Arms/Hands: None  Permission Sought/Granted                  Emotional Assessment Appearance:: Appears stated age   Affect (typically observed): Calm Orientation: : Oriented to Self, Oriented to Place, Oriented to  Time, Oriented to Situation Alcohol / Substance Use: Not Applicable Psych Involvement: No (comment)  Admission diagnosis:  Acute on chronic combined systolic and diastolic congestive heart failure (HCC) [I50.43] Cellulitis of right leg [L03.115] Patient Active Problem List   Diagnosis Date Noted  . Acute diastolic CHF (congestive heart failure) ([place]) 12/15/2018  . Cellulitis 12/08/2018  . Chronic obstructive pulmonary disease ([place])   . Benign prostatic hyperplasia without lower urinary tract symptoms   . Acute on chronic combined systolic and diastolic CHF (congestive heart failure) ([place]) 10/02/2018  . AKI (acute kidney injury) ([place]) 09/18/2018  . Acute on chronic combined systolic and diastolic congestive heart failure ([place]) 08/15/2018  . SOB (shortness of breath) 08/03/2018  . Volume overload 07/22/2018  . Acute systolic CHF (congestive heart failure) ([place]) 07/04/2018  . Acute on chronic combined systolic and diastolic heart failure ([place]) 04/01/2018  . Hypomagnesemia 04/01/2018  . Acute on chronic diastolic CHF (congestive  heart failure) (HCC) 03/11/2018  . Pressure injury of skin 03/11/2018  . CHF (congestive heart failure) (HCC) 09/08/2017  . Altered mental status 05/11/2017  . Acute exacerbation of CHF (congestive heart failure) (HCC) 04/20/2017  . Acute on chronic systolic (congestive) heart failure (HCC) 04/19/2017  . CHF exacerbation (HCC)  04/11/2017  . Chronic respiratory failure with hypoxia (HCC) 04/11/2017  . Encounter for hospice care discussion   . Palliative care encounter   . Goals of care, counseling/discussion   . DNR (do not resuscitate)   . DNR (do not resuscitate) discussion   . Chronic congestive heart failure (HCC) 02/18/2017  . Acute systolic heart failure (HCC) 01/18/2017  . Pedal edema 12/02/2016  . Acute CHF (congestive heart failure) (HCC) 11/09/2016  . Acute on chronic systolic CHF (congestive heart failure) (HCC) 08/19/2016  . CKD (chronic kidney disease), stage II 05/17/2016  . Coronary arteries, normal Dec 2016 01/23/2016  . Chronic anticoagulation-Xarelto 01/23/2016  . NSVT (nonsustained ventricular tachycardia) (HCC) 01/23/2016  . Elevated troponin 12/21/2015  . Nonischemic cardiomyopathy (HCC)   . Morbid obesity due to excess calories (HCC)   . Noncompliance with diet and medication regimen 12/02/2015  . OSA (obstructive sleep apnea) 09/20/2015  . Pulmonary hypertension (HCC) 09/19/2015  . Normocytic anemia 09/19/2015  . Atrial fibrillation, chronic   . Dyspnea on exertion 05/28/2015  . Acute respiratory failure with hypoxia (HCC) 04/01/2015  . Hypokalemia 04/01/2015  . Obesity hypoventilation syndrome (HCC) 08/08/2014  . GERD (gastroesophageal reflux disease) 08/08/2014  . Edema of right lower extremity 06/21/2014  . Fecal urgency 06/21/2014  . Asthma 02/11/2009  . Hyperlipidemia 07/12/2008  . OBESITY, MORBID 07/12/2008  . Anxiety state 07/12/2008  . DEPRESSION 07/12/2008  . Essential hypertension 07/12/2008  . ALLERGIC RHINITIS 07/12/2008  . Peptic ulcer 07/12/2008  . OSTEOARTHRITIS 07/12/2008   PCP:  Pearson Grippe, MD Pharmacy:   [facility] [phone] - [place], [place] - [address] [place] [address] Phone: [phone] Fax: [phone]     Social Determinants of Health (SDOH) Interventions    Readmission Risk  Interventions Readmission Risk Prevention Plan 11/26/2018 11/25/2018 11/11/2018  Transportation Screening Complete Complete Complete  Medication Review (RN Care Manager) Complete Complete Complete  PCP or Specialist appointment within 3-5 days of discharge Not Complete Not Complete -  HRI or Home Care Consult Complete Complete Not Complete  HRI or Home Care Consult Pt Refusal Comments - - not ordered, no HH company will accept pt any longer  SW Recovery Care/Counseling Consult Complete Complete Complete  Palliative Care Screening Not Applicable Not Applicable Not Applicable  Skilled Nursing Facility Not Applicable Not Applicable Not Applicable  Some recent data might be hidden

## 2018-12-15 NOTE — Progress Notes (Signed)
Pharmacy Antibiotic Note  [name] is a 53 y.o. male admitted on 12/14/2018 with cellulitis.  Pharmacy has been consulted for vancomycin dosing.  Plan: Vancomycin 2500 mg IV x 1 dose. Vancomycin 1750 mg IV every 12 hours. Monitor labs, c/s, and vanco levels as indicated.   Height: 6' (182.9 cm) Weight: (!) 369 lb 14.9 oz (167.8 kg) IBW/kg (Calculated) : 77.6  Temp (24hrs), Avg:98.5 F (36.9 C), Min:98.1 F (36.7 C), Max:98.8 F (37.1 C)  Recent Labs  Lab 12/09/18 0548 12/14/18 1840 12/14/18 1911 12/15/18 0403  WBC 4.4 4.6  --  3.6*  CREATININE 1.06 0.98  --  0.85  LATICACIDVEN  --   --  0.9  --     Estimated Creatinine Clearance: 167.2 mL/min (by C-G formula based on SCr of 0.85 mg/dL).    Allergies  Allergen Reactions  . Banana Shortness Of Breath  . Bee Venom Shortness Of Breath, Swelling and Other (See Comments)    Reaction:  Facial swelling  . Strawberry Flavor Shortness Of Breath and Rash  . Aspirin Other (See Comments)    Reaction:  GI upset   . Metolazone Other (See Comments)    Pt states that he stopped taking this med due to heart attack like symptoms.   . Oatmeal Nausea And Vomiting  . Orange Juice [Orange Oil] Nausea And Vomiting    All acidic products make him nauseous and upset stomach  . Torsemide Swelling and Other (See Comments)    Reaction:  Swelling of feet/legs   . Diltiazem Palpitations  . Hydralazine Palpitations  . Lipitor [Atorvastatin] Other (See Comments)    Reaction:  Nose bleeds : Patient is currently taking this medication    Antimicrobials this admission: 6/24 vancomycin >>  6/24 ceftriaxone x 1  Dose adjustments this admission:  Microbiology results: 6/24 BCx: pending 6/24 COVID: negative 6/25 MRSA PCR: pending   Thank you for allowing [place] to participate in this patients care.   Margot Ables, PharmD Clinical Pharmacist 12/15/2018 7:56 AM

## 2018-12-16 ENCOUNTER — Ambulatory Visit: Payer: Self-pay | Admitting: Pharmacist

## 2018-12-16 ENCOUNTER — Ambulatory Visit: Payer: Self-pay

## 2018-12-16 DIAGNOSIS — L03115 Cellulitis of right lower limb: Secondary | ICD-10-CM

## 2018-12-16 DIAGNOSIS — I10 Essential (primary) hypertension: Secondary | ICD-10-CM

## 2018-12-16 DIAGNOSIS — N400 Enlarged prostate without lower urinary tract symptoms: Secondary | ICD-10-CM

## 2018-12-16 DIAGNOSIS — J449 Chronic obstructive pulmonary disease, unspecified: Secondary | ICD-10-CM

## 2018-12-16 LAB — BASIC METABOLIC PANEL
Anion gap: 11 (ref 5–15)
BUN: 13 mg/dL (ref 6–20)
CO2: 26 mmol/L (ref 22–32)
Calcium: 8.9 mg/dL (ref 8.9–10.3)
Chloride: 103 mmol/L (ref 98–111)
Creatinine, Ser: 1.05 mg/dL (ref 0.61–1.24)
GFR calc Af Amer: >60 mL/min (ref >60)
GFR calc non Af Amer: >60 mL/min (ref >60)
Glucose, Bld: 87 mg/dL (ref 70–99)
Potassium: 3.8 mmol/L (ref 3.5–5.1)
Sodium: 140 mmol/L (ref 135–145)

## 2018-12-16 NOTE — Progress Notes (Addendum)
PROGRESS NOTE    Tammi KlippelMark T Pelto  ZOX:[id number] DOB: 04/03/1966 DOA: 12/14/2018 PCP: Pearson GrippeKim, [name], MD     Brief Narrative:  53 y.o. male, with history of morbid obesity, hypertension, chronic leg edema, chronic combined diastolic and systolic CHF, atrial fibrillation on anticoagulation with Xarelto, sleep apnea on CPAP who was recently discharged from the hospital on 12/09/2018 after he was admitted for acute on chronic combined systolic and diastolic CHF and lower extremity cellulitis.  Patient was discharged doxycycline. Patient says that the leg pain and swelling has not improved with antibiotics.  He continued to complain of right lower extremity swelling with watery discharge from the right leg. He denies fever or chills. Patient also complains of intermittent chest pain along with shortness of breath for past 2 days.  Patient does have chronic combined Solik and diastolic CHF and takes Bumex 2 mg 3 times a day. He denies nausea vomiting or diarrhea.  EKG showed nonspecific T wave abnormalities in lateral leads. Denies fever or chills. Denies contact with COVID-19 patients. SARS-CoV-2 is negative in the ED.  Assessment & Plan: 1-acute on chronic combined systolic and diastolic heart failure -Appears to be secondary to medication noncompliance and diet indiscretion -Still demonstrating signs of fluid overload on exam -No orthopnea and good oxygen saturation on room air -Continue daily weights, low-sodium diet and strict I's and O's -Continue IV Lasix. -Most recent ejection fraction demonstrating 35-40%.  2-Elevated blood pressure in the setting of hypertension -Blood pressure stable and well-controlled currently -Continue to follow vital signs.  3-chronic atrial fibrillation -continue beta-blocker -Rate controlled -Continue Xarelto for secondary prevention.  4-dyslipidemia  -continue statin  5-gastroesophageal reflux disease -Continue PPI  6-chronic kidney disease stage II  Creatinine at baseline -Continue to monitor renal function while pursuing active diuresis.  7-BPH -No complaints of urinary retention currently -Continue Flomax.  8-chronic respiratory failure/COPD -Appears stable and in no acute exacerbation. -Continue Flovent, continue as needed oxygen supplementation and continue as needed oxygen supplementation. -Patient most likely with component of obesity hypoventilation syndrome. -Currently with good oxygen saturation on room air.  10-right lower extremity bruising/cellulitis -Continue oral doxycycline to complete antibiotic therapy. -No warmth sensation, no fever, currently denies any pain. -oozing stopped after fluid overload/leg swelling has improved.  11-morbid obesity -Body mass index is 48.74 kg/m. -low calorie diet, increase physical activity and portion control discussed with ps   DVT prophylaxis: Xarelto Code Status: Full code Family Communication: No family bedside. Disposition Plan: Remains inpatient, continue IV Lasix, follow daily weights, low-sodium diet and strict I's and O's.  Extensive education provided regarding his condition and plan of care.  Hopefully discharge home in the next 24-48 hours.  Consultants:   Cardiology (no further recommendations provided at this time; continue treatment for heart failure).  Procedures:   See below for x-ray reports.  Antimicrobials:  Anti-infectives (From admission, onward)   Start     Dose/Rate Route Frequency Ordered Stop   12/15/18 1400  vancomycin (VANCOCIN) 1,750 mg in sodium chloride 0.9 % 500 mL IVPB  Status:  Discontinued     1,750 mg 250 mL/hr over 120 Minutes Intravenous Every 12 hours 12/15/18 0755 12/15/18 0944   12/15/18 1215  doxycycline (VIBRA-TABS) tablet 100 mg     100 mg Oral Every 12 hours 12/15/18 1209     12/15/18 0230  vancomycin (VANCOCIN) IVPB 750 mg/150 ml premix     750 mg 150 mL/hr over 60 Minutes Intravenous Every hour 12/15/18 0219 12/16/18 0015  12/14/18 2100  cefTRIAXone (ROCEPHIN) 1 g in sodium chloride 0.9 % 100 mL IVPB     1 g 200 mL/hr over 30 Minutes Intravenous  Once 12/14/18 2055 12/14/18 2153   12/14/18 2100  vancomycin (VANCOCIN) IVPB 1000 mg/200 mL premix     1,000 mg 200 mL/hr over 60 Minutes Intravenous  Once 12/14/18 2055 12/14/18 2258     Subjective: Still with signs of fluid overload on exam, currently not complaining of orthopnea, no nausea, no vomiting, no chest pain.  Patient is afebrile.  Objective: Vitals:   12/15/18 2031 12/15/18 2100 12/15/18 2222 12/16/18 0513  BP:  (!) 138/97 129/89 128/76  Pulse:   68 82  Resp:   17 17  Temp: 98.7 F (37.1 C)  98.3 F (36.8 C) 98.2 F (36.8 C)  TempSrc:      SpO2:   99% 95%  Weight:    (!) 163 kg  Height:        Intake/Output Summary (Last 24 hours) at 12/16/2018 1255 Last data filed at 12/16/2018 0824 Gross per 24 hour  Intake 0 ml  Output 2350 ml  Net -2350 ml   Filed Weights   12/14/18 1754 12/15/18 0200 12/16/18 0513  Weight: (!) 170.1 kg (!) 167.8 kg (!) 163 kg    Examination: General exam: Alert, awake, oriented x 3, afebrile, no chest pain, reports no orthopnea currently and overall improved swelling.  Still with signs of fluid overload on exam. Respiratory system: Decreased breath sounds at the bases, no using accessory muscles, normal respiratory effort.  Good oxygen saturation on room air at this time. Cardiovascular system:Rate controlled. No murmurs, rubs, gallops.  Unable to properly assess for JVD due to body habitus. Gastrointestinal system: Abdomen is obese, nondistended, soft and nontender. No organomegaly or masses felt. Normal bowel sounds heard. Central nervous system: Alert and oriented. No focal neurological deficits. Extremities/skin: No cyanosis or clubbing, 2-3+ edema bilaterally, chronic stasis dermatitis appreciated, no warmth sensation, no open wounds, no erythematous changes. Psychiatry: Judgement and insight appear normal.  Mood & affect appropriate.    Data Reviewed: I have personally reviewed following labs and imaging studies  CBC: Recent Labs  Lab 12/14/18 1840 12/15/18 0403  WBC 4.6 3.6*  NEUTROABS 2.9  --   HGB 12.7* 12.6*  HCT 41.2 40.7  MCV 89.0 88.7  PLT 224 207   Basic Metabolic Panel: Recent Labs  Lab 12/14/18 1840 12/15/18 0403 12/16/18 0501  NA 136 140 140  K 3.9 3.4* 3.8  CL 104 105 103  CO2 23 26 26   GLUCOSE 100* 94 87  BUN 13 11 13   CREATININE 0.98 0.85 1.05  CALCIUM 8.4* 8.6* 8.9   GFR: Estimated Creatinine Clearance: 133.1 mL/min (by C-G formula based on SCr of 1.05 mg/dL).   Liver Function Tests: Recent Labs  Lab 12/14/18 1840 12/15/18 0403  AST 19 15  ALT 16 15  ALKPHOS 64 52  BILITOT 0.7 1.0  PROT 7.3 6.9  ALBUMIN 3.8 3.6   Urine analysis:    Component Value Date/Time   COLORURINE YELLOW 04/01/2018 0332   APPEARANCEUR CLEAR 04/01/2018 0332   LABSPEC 1.008 04/01/2018 0332   PHURINE 7.0 04/01/2018 0332   GLUCOSEU NEGATIVE 04/01/2018 0332   GLUCOSEU NEG mg/dL 81/19/147808/10/2009 [id number]   HGBUR NEGATIVE 04/01/2018 0332   BILIRUBINUR NEGATIVE 04/01/2018 0332   KETONESUR NEGATIVE 04/01/2018 0332   PROTEINUR NEGATIVE 04/01/2018 0332   UROBILINOGEN 0.2 08/08/2014 1445   NITRITE NEGATIVE 04/01/2018 [id number]  LEUKOCYTESUR NEGATIVE 04/01/2018 0332    Recent Results (from the past 240 hour(s))  Blood culture (routine x 2)     Status: None   Collection Time: 12/08/18  3:07 AM   Specimen: Right Antecubital; Blood  Result Value Ref Range Status   Specimen Description RIGHT ANTECUBITAL  Final   Special Requests   Final    BOTTLES DRAWN AEROBIC AND ANAEROBIC Blood Culture adequate volume   Culture   Final    NO GROWTH 5 DAYS Performed at [facility], [address] [place], [place] [address]    Report Status 12/13/2018 FINAL  Final  Blood culture (routine x 2)     Status: None   Collection Time: 12/08/18  3:15 AM   Specimen: Left Antecubital; Blood  Result Value  Ref Range Status   Specimen Description LEFT ANTECUBITAL  Final   Special Requests   Final    BOTTLES DRAWN AEROBIC AND ANAEROBIC Blood Culture adequate volume   Culture   Final    NO GROWTH 5 DAYS Performed at [facility], [address] [place], [place] [address]    Report Status 12/13/2018 FINAL  Final  SARS Coronavirus 2 [facility] order, Performed in [place] hospital lab)     Status: None   Collection Time: 12/08/18  4:48 AM  Result Value Ref Range Status   SARS Coronavirus 2 NEGATIVE NEGATIVE Final    Comment: (NOTE) If result is NEGATIVE SARS-CoV-2 target nucleic acids are NOT DETECTED. The SARS-CoV-2 RNA is generally detectable in upper and lower  respiratory specimens during the acute phase of infection. The lowest  concentration of SARS-CoV-2 viral copies this assay can detect is 250  copies / mL. A negative result does not preclude SARS-CoV-2 infection  and should not be used as the sole basis for treatment or other  patient management decisions.  A negative result may occur with  improper specimen collection / handling, submission of specimen other  than nasopharyngeal swab, presence of viral mutation(s) within the  areas targeted by this assay, and inadequate number of viral copies  (<250 copies / mL). A negative result must be combined with clinical  observations, patient history, and epidemiological information. If result is POSITIVE SARS-CoV-2 target nucleic acids are DETECTED. The SARS-CoV-2 RNA is generally detectable in upper and lower  respiratory specimens dur ing the acute phase of infection.  Positive  results are indicative of active infection with SARS-CoV-2.  Clinical  correlation with patient history and other diagnostic information is  necessary to determine patient infection status.  Positive results do  not rule out bacterial infection or co-infection with other viruses. If result is PRESUMPTIVE POSTIVE SARS-CoV-2 nucleic acids MAY BE PRESENT.    A presumptive positive result was obtained on the submitted specimen  and confirmed on repeat testing.  While 2019 novel coronavirus  (SARS-CoV-2) nucleic acids may be present in the submitted sample  additional confirmatory testing may be necessary for epidemiological  and / or clinical management purposes  to differentiate between  SARS-CoV-2 and other Sarbecovirus currently known to infect humans.  If clinically indicated additional testing with an alternate test  methodology [phone]) is advised. The SARS-CoV-2 RNA is generally  detectable in upper and lower respiratory sp ecimens during the acute  phase of infection. The expected result is Negative. Fact Sheet for Patients:  StrictlyIdeas.no Fact Sheet for Healthcare Providers: BankingDealers.co.za This test is not yet approved or cleared by the [place] FDA and has been authorized for detection  and/or diagnosis of SARS-CoV-2 by FDA under an Emergency Use Authorization (EUA).  This EUA will remain in effect (meaning this test can be used) for the duration of the COVID-19 declaration under Section 564(b)(1) of the Act, 21 U.S.C. section 360bbb-3(b)(1), unless the authorization is terminated or revoked sooner. Performed at [facility], [address] [place], [place] [address]   Blood culture (routine x 2)     Status: None (Preliminary result)   Collection Time: 12/14/18  7:11 PM   Specimen: Right Antecubital; Blood  Result Value Ref Range Status   Specimen Description RIGHT ANTECUBITAL  Final   Special Requests   Final    BOTTLES DRAWN AEROBIC AND ANAEROBIC Blood Culture adequate volume   Culture   Final    NO GROWTH 2 DAYS Performed at [facility], [address] [place], [place] [address]    Report Status PENDING  Incomplete  Blood culture (routine x 2)     Status: None (Preliminary result)   Collection Time: 12/14/18  7:11 PM   Specimen: BLOOD LEFT HAND  Result Value  Ref Range Status   Specimen Description BLOOD LEFT HAND  Final   Special Requests   Final    BOTTLES DRAWN AEROBIC AND ANAEROBIC Blood Culture adequate volume   Culture   Final    NO GROWTH 2 DAYS Performed at [facility], [address], [place], [place] [address]    Report Status PENDING  Incomplete  SARS Coronavirus 2 ([facility] - Performed in [facility] hospital lab), Hosp Order     Status: None   Collection Time: 12/14/18  9:34 PM   Specimen: Nasopharyngeal Swab  Result Value Ref Range Status   SARS Coronavirus 2 NEGATIVE NEGATIVE Final    Comment: (NOTE) If result is NEGATIVE SARS-CoV-2 target nucleic acids are NOT DETECTED. The SARS-CoV-2 RNA is generally detectable in upper and lower  respiratory specimens during the acute phase of infection. The lowest  concentration of SARS-CoV-2 viral copies this assay can detect is 250  copies / mL. A negative result does not preclude SARS-CoV-2 infection  and should not be used as the sole basis for treatment or other  patient management decisions.  A negative result may occur with  improper specimen collection / handling, submission of specimen other  than nasopharyngeal swab, presence of viral mutation(s) within the  areas targeted by this assay, and inadequate number of viral copies  (<250 copies / mL). A negative result must be combined with clinical  observations, patient history, and epidemiological information. If result is POSITIVE SARS-CoV-2 target nucleic acids are DETECTED. The SARS-CoV-2 RNA is generally detectable in upper and lower  respiratory specimens dur ing the acute phase of infection.  Positive  results are indicative of active infection with SARS-CoV-2.  Clinical  correlation with patient history and other diagnostic information is  necessary to determine patient infection status.  Positive results do  not rule out bacterial infection or co-infection with other viruses. If result is PRESUMPTIVE POSTIVE  SARS-CoV-2 nucleic acids MAY BE PRESENT.   A presumptive positive result was obtained on the submitted specimen  and confirmed on repeat testing.  While 2019 novel coronavirus  (SARS-CoV-2) nucleic acids may be present in the submitted sample  additional confirmatory testing may be necessary for epidemiological  and / or clinical management purposes  to differentiate between  SARS-CoV-2 and other Sarbecovirus currently known to infect humans.  If clinically indicated additional testing with an alternate test  methodology [phone]) is  advised. The SARS-CoV-2 RNA is generally  detectable in upper and lower respiratory sp ecimens during the acute  phase of infection. The expected result is Negative. Fact Sheet for Patients:  BoilerBrush.com.cyhttps://www.fda.gov/media/136312/download Fact Sheet for Healthcare Providers: https://pope.com/https://www.fda.gov/media/136313/download This test is not yet approved or cleared by the [place] FDA and has been authorized for detection and/or diagnosis of SARS-CoV-2 by FDA under an Emergency Use Authorization (EUA).  This EUA will remain in effect (meaning this test can be used) for the duration of the COVID-19 declaration under Section 564(b)(1) of the Act, 21 U.S.C. section 360bbb-3(b)(1), unless the authorization is terminated or revoked sooner. Performed at [facility], [address] [place], [place] [address]   MRSA PCR Screening     Status: Abnormal   Collection Time: 12/15/18  1:57 AM   Specimen: Nasal Mucosa; Nasopharyngeal  Result Value Ref Range Status   MRSA by PCR POSITIVE (A) NEGATIVE Final    Comment:        The GeneXpert MRSA Assay (FDA approved for NASAL specimens only), is one component of a comprehensive MRSA colonization surveillance program. It is not intended to diagnose MRSA infection nor to guide or monitor treatment for MRSA infections. RESULT CALLED TO, READ BACK BY AND VERIFIED WITH: DISHMON,M ON 12/15/18 AT 1530 BY LOY,C Performed at [facility], [address] [place], [place] [address]      Radiology Studies: Dg Chest Portable 1 View  Result Date: 12/14/2018 CLINICAL DATA:  Shortness of breath EXAM: PORTABLE CHEST 1 VIEW COMPARISON:  December 08, 2018 FINDINGS: The heart is significantly enlarged. There is evidence of vascular congestion and pulmonary edema. No pneumothorax. There may be small bilateral pleural effusions. Evaluation is limited by patient body habitus. IMPRESSION: Cardiomegaly with persistent vascular congestion. Electronically Signed   By: Katherine Mantlehristopher  Green M.D.   On: 12/14/2018 19:31    Scheduled Meds: . atorvastatin  40 mg Oral Daily  . carvedilol  25 mg Oral BID WC  . Chlorhexidine Gluconate Cloth  6 each Topical Daily  . Chlorhexidine Gluconate Cloth  6 each Topical Q0600  . docusate sodium  100 mg Oral q morning - 10a  . doxycycline  100 mg Oral Q12H  . ferrous gluconate  324 mg Oral BID WC  . furosemide  60 mg Intravenous Q12H  . lisinopril  10 mg Oral Daily  . mouth rinse  15 mL Mouth Rinse BID  . mupirocin ointment  1 application Nasal BID  . pantoprazole  40 mg Oral Q0600  . potassium chloride SA  40 mEq Oral BID  . rivaroxaban  20 mg Oral Q supper  . sodium chloride flush  3 mL Intravenous Q12H  . spironolactone  100 mg Oral Daily  . tamsulosin  0.4 mg Oral Daily   Continuous Infusions: . sodium chloride 250 mL (12/15/18 0303)     LOS: 1 day    Time spent: 30 minutes.    Vassie Lollarlos Jamiere Gulas, MD [facility] Pager [phone]  12/16/2018, 12:55 PM

## 2018-12-16 NOTE — Care Management Important Message (Signed)
Important Message  Patient Details  Name: [name] MRN: [id number] Date of Birth: May 28, 1966   Medicare Important Message Given:  Yes     Ekansh Sherk, Chauncey Reading, RN 12/16/2018, 1:06 PM

## 2018-12-16 NOTE — Plan of Care (Signed)

## 2018-12-17 LAB — BASIC METABOLIC PANEL
Anion gap: 8 (ref 5–15)
BUN: 17 mg/dL (ref 6–20)
CO2: 30 mmol/L (ref 22–32)
Calcium: 9.2 mg/dL (ref 8.9–10.3)
Chloride: 103 mmol/L (ref 98–111)
Creatinine, Ser: 1.21 mg/dL (ref 0.61–1.24)
GFR calc Af Amer: >60 mL/min (ref >60)
GFR calc non Af Amer: >60 mL/min (ref >60)
Glucose, Bld: 91 mg/dL (ref 70–99)
Potassium: 4.1 mmol/L (ref 3.5–5.1)
Sodium: 141 mmol/L (ref 135–145)

## 2018-12-17 IMAGING — DX DG CHEST 2V
2 series · 2 of 2 positions shown · non-contrast
Comparison: 04/09/2017

CLINICAL DATA: Shortness of breath beginning 2 days ago.

EXAM:
CHEST  2 VIEW

[chest ap]
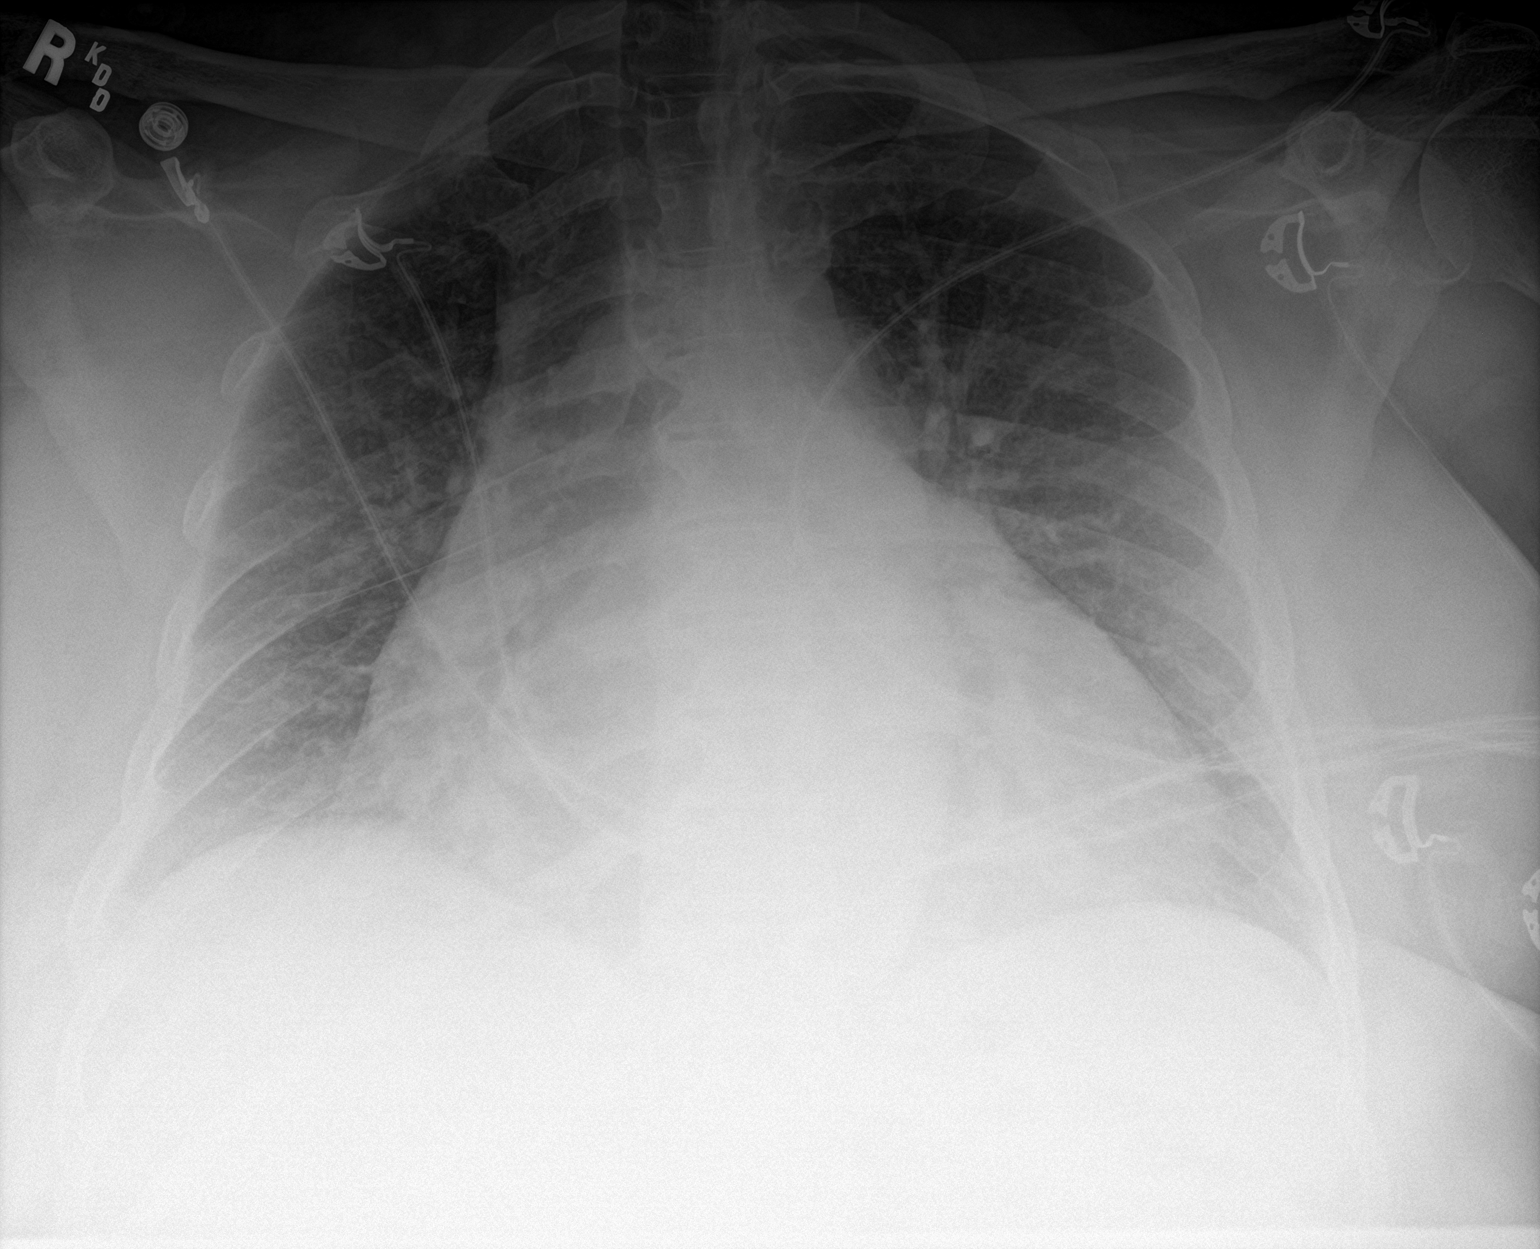

[chest lat]
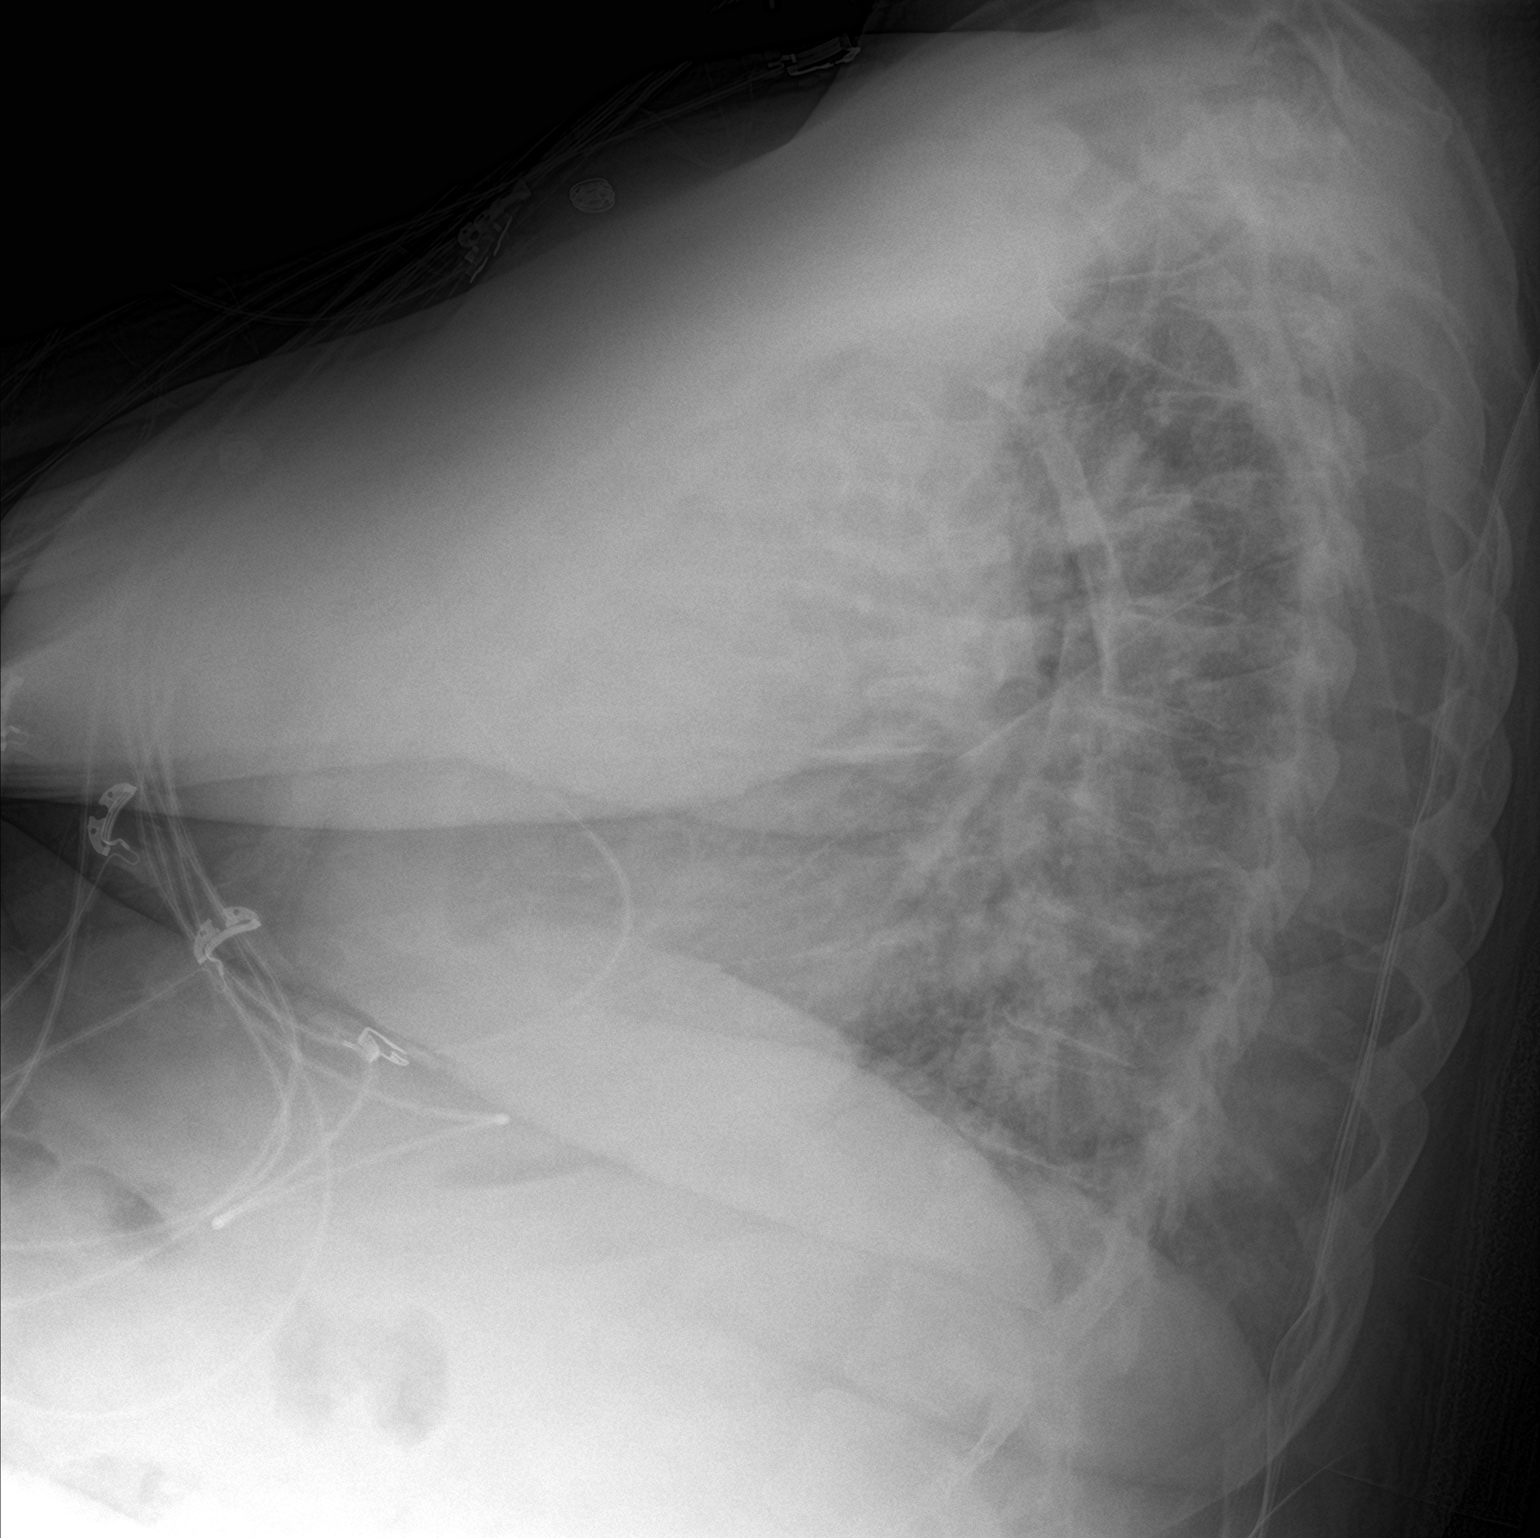

[2 of 2 positions shown; findings below may reference images not displayed]

FINDINGS: Cardiac enlargement. Pulmonary vascularity is normal. No airspace
disease or consolidation in the lungs. No blunting of costophrenic
angles. No pneumothorax. Mediastinal contours appear intact.
IMPRESSION: Cardiac enlargement.  No evidence of active pulmonary disease.

## 2018-12-17 MED ORDER — FUROSEMIDE 80 MG PO TABS
120.0000 mg | ORAL_TABLET | Freq: Two times a day (BID) | ORAL | Status: DC
  Administered 2018-12-17 – 2018-12-18 (×2): 120 mg via ORAL
  Filled 2018-12-17 (×3): qty 1

## 2018-12-17 NOTE — Progress Notes (Signed)
PROGRESS NOTE    [name]  DPO:[id number] DOB: [birth year] DOA: 12/14/2018 PCP: Jani Gravel, MD     Brief Narrative:  53 y.o. male, with history of morbid obesity, hypertension, chronic leg edema, chronic combined diastolic and systolic CHF, atrial fibrillation on anticoagulation with Xarelto, sleep apnea on CPAP who was recently discharged from the hospital on 12/09/2018 after he was admitted for acute on chronic combined systolic and diastolic CHF and lower extremity cellulitis.  Patient was discharged doxycycline. Patient says that the leg pain and swelling has not improved with antibiotics.  He continued to complain of right lower extremity swelling with watery discharge from the right leg. He denies fever or chills. Patient also complains of intermittent chest pain along with shortness of breath for past 2 days.  Patient does have chronic combined Solik and diastolic CHF and takes Bumex 2 mg 3 times a day. He denies nausea vomiting or diarrhea.  EKG showed nonspecific T wave abnormalities in lateral leads. Denies fever or chills. Denies contact with COVID-19 patients. SARS-CoV-2 is negative in the ED.  Assessment & Plan: 1-acute on chronic combined systolic and diastolic heart failure -Appears to be secondary to medication noncompliance and diet indiscretion -Still demonstrating signs of fluid overload on exam -No orthopnea and good oxygen saturation on room air -Continue daily weights, low-sodium diet and strict I's and O's -Continue Lasix, but will transition to oral regimen and assess response.  -continue b-blocker, lisinopril and spironolactone  -Most recent ejection fraction demonstrating 35-40%.  2-Elevated blood pressure in the setting of hypertension -Blood pressure stable and well-controlled currently -Continue to follow vital signs.  3-chronic atrial fibrillation -continue beta-blocker -Rate controlled -Continue Xarelto for secondary prevention.  4-dyslipidemia   -continue statin  5-gastroesophageal reflux disease -Continue PPI  6-chronic kidney disease stage II Creatinine at baseline -Continue to monitor renal function while pursuing active diuresis.  7-BPH -No complaints of urinary retention currently -Continue Flomax.  8-chronic respiratory failure/COPD -Appears stable and in no acute exacerbation. -Continue Flovent, continue as needed oxygen supplementation and continue as needed oxygen supplementation. -Patient most likely with component of obesity hypoventilation syndrome. -Currently with good oxygen saturation on room air.  10-right lower extremity bruising/cellulitis -Continue oral doxycycline to complete antibiotic therapy. -No warmth sensation, no fever, currently denies any pain. -oozing stopped after fluid overload/leg swelling has improved.  11-morbid obesity -Body mass index is 48.11 kg/m. -low calorie diet, increase physical activity and portion control discussed with ps   DVT prophylaxis: Xarelto Code Status: Full code Family Communication: No family bedside. Disposition Plan: Remains inpatient, continue Lasix, but will transition to oral regimen, follow daily weights, low-sodium diet and strict I's and O's.  Extensive education provided regarding his condition and plan of care.  Hopefully discharge home in the next 24 hours.  Consultants:   Cardiology (no further recommendations provided at this time; continue treatment for heart failure).  Procedures:   See below for x-ray reports.  Antimicrobials:  Anti-infectives (From admission, onward)   Start     Dose/Rate Route Frequency Ordered Stop   12/15/18 1400  vancomycin (VANCOCIN) 1,750 mg in sodium chloride 0.9 % 500 mL IVPB  Status:  Discontinued     1,750 mg 250 mL/hr over 120 Minutes Intravenous Every 12 hours 12/15/18 0755 12/15/18 0944   12/15/18 1215  doxycycline (VIBRA-TABS) tablet 100 mg     100 mg Oral Every 12 hours 12/15/18 1209 12/20/18 0959    12/15/18 0230  vancomycin (VANCOCIN) IVPB 750 mg/150 ml  premix     750 mg 150 mL/hr over 60 Minutes Intravenous Every hour 12/15/18 0219 12/16/18 0015   12/14/18 2100  cefTRIAXone (ROCEPHIN) 1 g in sodium chloride 0.9 % 100 mL IVPB     1 g 200 mL/hr over 30 Minutes Intravenous  Once 12/14/18 2055 12/14/18 2153   12/14/18 2100  vancomycin (VANCOCIN) IVPB 1000 mg/200 mL premix     1,000 mg 200 mL/hr over 60 Minutes Intravenous  Once 12/14/18 2055 12/14/18 2258     Subjective: No fever, no CP, no nausea, no vomiting, no orthopnea. Improved overall swelling and continue to have good urine output.   Objective: Vitals:   12/17/18 0641 12/17/18 0824 12/17/18 1034 12/17/18 1400  BP: 102/64  116/69 102/71  Pulse: (!) 55  60 70  Resp: 18   18  Temp: 98 F (36.7 C)  97.8 F (36.6 C) 98.7 F (37.1 C)  TempSrc: Oral  Oral Oral  SpO2: 97% 98% 95% 96%  Weight: (!) 160.9 kg     Height:        Intake/Output Summary (Last 24 hours) at 12/17/2018 1732 Last data filed at 12/17/2018 1200 Gross per 24 hour  Intake 480 ml  Output 2000 ml  Net -1520 ml   Filed Weights   12/15/18 0200 12/16/18 0513 12/17/18 0641  Weight: (!) 167.8 kg (!) 163 kg (!) 160.9 kg    Examination: General exam: Alert, awake, oriented x 3, no CP, no nausea, no vomiting, reports no orthopnea and good urine output.  Respiratory system: Improved air movement bilaterally; no frank crackles, no wheezing.  Respiratory effort normal. Cardiovascular system:Rate controlled. No murmurs, rubs, gallops. Gastrointestinal system: Abdomen is obese, nondistended, soft and nontender. No organomegaly or masses felt. Normal bowel sounds heard. Central nervous system: Alert and oriented. No focal neurological deficits. Extremities/skin: No cyanosis, 2++ edema bilaterally, no open wounds, no oozing or drainage. Chronic stasis dermitis appreciated. Patient reported no pain.  Skin: No rashes, lesions or ulcers Psychiatry: Judgement and  insight appear normal. Mood & affect appropriate.     Data Reviewed: I have personally reviewed following labs and imaging studies  CBC: Recent Labs  Lab 12/14/18 1840 12/15/18 0403  WBC 4.6 3.6*  NEUTROABS 2.9  --   HGB 12.7* 12.6*  HCT 41.2 40.7  MCV 89.0 88.7  PLT 224 207   Basic Metabolic Panel: Recent Labs  Lab 12/14/18 1840 12/15/18 0403 12/16/18 0501 12/17/18 0650  NA 136 140 140 141  K 3.9 3.4* 3.8 4.1  CL 104 105 103 103  CO2 23 26 26 30   GLUCOSE 100* 94 87 91  BUN 13 11 13 17   CREATININE 0.98 0.85 1.05 1.21  CALCIUM 8.4* 8.6* 8.9 9.2   GFR: Estimated Creatinine Clearance: 114.6 mL/min (by C-G formula based on SCr of 1.21 mg/dL).   Liver Function Tests: Recent Labs  Lab 12/14/18 1840 12/15/18 0403  AST 19 15  ALT 16 15  ALKPHOS 64 52  BILITOT 0.7 1.0  PROT 7.3 6.9  ALBUMIN 3.8 3.6   Urine analysis:    Component Value Date/Time   COLORURINE YELLOW 04/01/2018 0332   APPEARANCEUR CLEAR 04/01/2018 0332   LABSPEC 1.008 04/01/2018 0332   PHURINE 7.0 04/01/2018 0332   GLUCOSEU NEGATIVE 04/01/2018 0332   GLUCOSEU NEG mg/dL 78/29/562108/10/2009 30862254   HGBUR NEGATIVE 04/01/2018 0332   BILIRUBINUR NEGATIVE 04/01/2018 0332   KETONESUR NEGATIVE 04/01/2018 0332   PROTEINUR NEGATIVE 04/01/2018 0332   UROBILINOGEN 0.2 08/08/2014 1445  NITRITE NEGATIVE 04/01/2018 0332   LEUKOCYTESUR NEGATIVE 04/01/2018 0332    Recent Results (from the past 240 hour(s))  Blood culture (routine x 2)     Status: None   Collection Time: 12/08/18  3:07 AM   Specimen: Right Antecubital; Blood  Result Value Ref Range Status   Specimen Description RIGHT ANTECUBITAL  Final   Special Requests   Final    BOTTLES DRAWN AEROBIC AND ANAEROBIC Blood Culture adequate volume   Culture   Final    NO GROWTH 5 DAYS Performed at [facility], [address] [place], [place] [address]    Report Status 12/13/2018 FINAL  Final  Blood culture (routine x 2)     Status: None   Collection Time:  12/08/18  3:15 AM   Specimen: Left Antecubital; Blood  Result Value Ref Range Status   Specimen Description LEFT ANTECUBITAL  Final   Special Requests   Final    BOTTLES DRAWN AEROBIC AND ANAEROBIC Blood Culture adequate volume   Culture   Final    NO GROWTH 5 DAYS Performed at [facility], [address] [place], [place] [address]    Report Status 12/13/2018 FINAL  Final  SARS Coronavirus 2 [facility](Hospital order, Performed in [facility] hospital lab)     Status: None   Collection Time: 12/08/18  4:48 AM  Result Value Ref Range Status   SARS Coronavirus 2 NEGATIVE NEGATIVE Final    Comment: (NOTE) If result is NEGATIVE SARS-CoV-2 target nucleic acids are NOT DETECTED. The SARS-CoV-2 RNA is generally detectable in upper and lower  respiratory specimens during the acute phase of infection. The lowest  concentration of SARS-CoV-2 viral copies this assay can detect is 250  copies / mL. A negative result does not preclude SARS-CoV-2 infection  and should not be used as the sole basis for treatment or other  patient management decisions.  A negative result may occur with  improper specimen collection / handling, submission of specimen other  than nasopharyngeal swab, presence of viral mutation(s) within the  areas targeted by this assay, and inadequate number of viral copies  (<250 copies / mL). A negative result must be combined with clinical  observations, patient history, and epidemiological information. If result is POSITIVE SARS-CoV-2 target nucleic acids are DETECTED. The SARS-CoV-2 RNA is generally detectable in upper and lower  respiratory specimens dur ing the acute phase of infection.  Positive  results are indicative of active infection with SARS-CoV-2.  Clinical  correlation with patient history and other diagnostic information is  necessary to determine patient infection status.  Positive results do  not rule out bacterial infection or co-infection with other viruses. If  result is PRESUMPTIVE POSTIVE SARS-CoV-2 nucleic acids MAY BE PRESENT.   A presumptive positive result was obtained on the submitted specimen  and confirmed on repeat testing.  While 2019 novel coronavirus  (SARS-CoV-2) nucleic acids may be present in the submitted sample  additional confirmatory testing may be necessary for epidemiological  and / or clinical management purposes  to differentiate between  SARS-CoV-2 and other Sarbecovirus currently known to infect humans.  If clinically indicated additional testing with an alternate test  methodology [phone]([id number]) is advised. The SARS-CoV-2 RNA is generally  detectable in upper and lower respiratory sp ecimens during the acute  phase of infection. The expected result is Negative. Fact Sheet for Patients:  BoilerBrush.com.cyhttps://www.fda.gov/media/136312/download Fact Sheet for Healthcare Providers: https://pope.com/https://www.fda.gov/media/136313/download This test is not yet approved or cleared by the [place] FDA  and has been authorized for detection and/or diagnosis of SARS-CoV-2 by FDA under an Emergency Use Authorization (EUA).  This EUA will remain in effect (meaning this test can be used) for the duration of the COVID-19 declaration under Section 564(b)(1) of the Act, 21 U.S.C. section 360bbb-3(b)(1), unless the authorization is terminated or revoked sooner. Performed at [facility], [address] [place], [place] [address]   Blood culture (routine x 2)     Status: None (Preliminary result)   Collection Time: 12/14/18  7:11 PM   Specimen: Right Antecubital; Blood  Result Value Ref Range Status   Specimen Description RIGHT ANTECUBITAL  Final   Special Requests   Final    BOTTLES DRAWN AEROBIC AND ANAEROBIC Blood Culture adequate volume   Culture   Final    NO GROWTH 3 DAYS Performed at [facility], [address] [place], [place] [address]    Report Status PENDING  Incomplete  Blood culture (routine x 2)     Status: None (Preliminary result)    Collection Time: 12/14/18  7:11 PM   Specimen: BLOOD LEFT HAND  Result Value Ref Range Status   Specimen Description BLOOD LEFT HAND  Final   Special Requests   Final    BOTTLES DRAWN AEROBIC AND ANAEROBIC Blood Culture adequate volume   Culture   Final    NO GROWTH 3 DAYS Performed at [facility], [address] [place], [place] [address]    Report Status PENDING  Incomplete  SARS Coronavirus 2 (CEPHEID - Performed in [facility] hospital lab), Hosp Order     Status: None   Collection Time: 12/14/18  9:34 PM   Specimen: Nasopharyngeal Swab  Result Value Ref Range Status   SARS Coronavirus 2 NEGATIVE NEGATIVE Final    Comment: (NOTE) If result is NEGATIVE SARS-CoV-2 target nucleic acids are NOT DETECTED. The SARS-CoV-2 RNA is generally detectable in upper and lower  respiratory specimens during the acute phase of infection. The lowest  concentration of SARS-CoV-2 viral copies this assay can detect is 250  copies / mL. A negative result does not preclude SARS-CoV-2 infection  and should not be used as the sole basis for treatment or other  patient management decisions.  A negative result may occur with  improper specimen collection / handling, submission of specimen other  than nasopharyngeal swab, presence of viral mutation(s) within the  areas targeted by this assay, and inadequate number of viral copies  (<250 copies / mL). A negative result must be combined with clinical  observations, patient history, and epidemiological information. If result is POSITIVE SARS-CoV-2 target nucleic acids are DETECTED. The SARS-CoV-2 RNA is generally detectable in upper and lower  respiratory specimens dur ing the acute phase of infection.  Positive  results are indicative of active infection with SARS-CoV-2.  Clinical  correlation with patient history and other diagnostic information is  necessary to determine patient infection status.  Positive results do  not rule out bacterial infection  or co-infection with other viruses. If result is PRESUMPTIVE POSTIVE SARS-CoV-2 nucleic acids MAY BE PRESENT.   A presumptive positive result was obtained on the submitted specimen  and confirmed on repeat testing.  While 2019 novel coronavirus  (SARS-CoV-2) nucleic acids may be present in the submitted sample  additional confirmatory testing may be necessary for epidemiological  and / or clinical management purposes  to differentiate between  SARS-CoV-2 and other Sarbecovirus currently known to infect humans.  If clinically indicated additional testing with an  alternate test  methodology [phone]) is advised. The SARS-CoV-2 RNA is generally  detectable in upper and lower respiratory sp ecimens during the acute  phase of infection. The expected result is Negative. Fact Sheet for Patients:  BoilerBrush.com.cy Fact Sheet for Healthcare Providers: https://pope.com/ This test is not yet approved or cleared by the [place] FDA and has been authorized for detection and/or diagnosis of SARS-CoV-2 by FDA under an Emergency Use Authorization (EUA).  This EUA will remain in effect (meaning this test can be used) for the duration of the COVID-19 declaration under Section 564(b)(1) of the Act, 21 U.S.C. section 360bbb-3(b)(1), unless the authorization is terminated or revoked sooner. Performed at [facility], [address], [place], [place] [address]   MRSA PCR Screening     Status: Abnormal   Collection Time: 12/15/18  1:57 AM   Specimen: Nasal Mucosa; Nasopharyngeal  Result Value Ref Range Status   MRSA by PCR POSITIVE (A) NEGATIVE Final    Comment:        The GeneXpert MRSA Assay (FDA approved for NASAL specimens only), is one component of a comprehensive MRSA colonization surveillance program. It is not intended to diagnose MRSA infection nor to guide or monitor treatment for MRSA infections. RESULT CALLED TO, READ BACK BY AND  VERIFIED WITH: DISHMON,M ON 12/15/18 AT 1530 BY LOY,C Performed at [facility], [address] [place], [place] [address]      Radiology Studies: No results found.  Scheduled Meds: . atorvastatin  40 mg Oral Daily  . carvedilol  25 mg Oral BID WC  . Chlorhexidine Gluconate Cloth  6 each Topical Daily  . Chlorhexidine Gluconate Cloth  6 each Topical Q0600  . docusate sodium  100 mg Oral q morning - 10a  . doxycycline  100 mg Oral Q12H  . ferrous gluconate  324 mg Oral BID WC  . furosemide  120 mg Oral BID  . lisinopril  10 mg Oral Daily  . mouth rinse  15 mL Mouth Rinse BID  . mupirocin ointment  1 application Nasal BID  . pantoprazole  40 mg Oral Q0600  . potassium chloride SA  40 mEq Oral BID  . rivaroxaban  20 mg Oral Q supper  . sodium chloride flush  3 mL Intravenous Q12H  . spironolactone  100 mg Oral Daily  . tamsulosin  0.4 mg Oral Daily   Continuous Infusions: . sodium chloride 250 mL (12/15/18 0303)     LOS: 2 days    Time spent: 30 minutes.   Vassie Loll, MD [facility] Pager [phone]  12/17/2018, 5:32 PM

## 2018-12-17 NOTE — Progress Notes (Signed)
Patient not wearing CPAP.  NO unit

## 2018-12-18 DIAGNOSIS — N182 Chronic kidney disease, stage 2 (mild): Secondary | ICD-10-CM

## 2018-12-18 DIAGNOSIS — L03115 Cellulitis of right lower limb: Secondary | ICD-10-CM

## 2018-12-18 LAB — BASIC METABOLIC PANEL
Anion gap: 11 (ref 5–15)
BUN: 21 mg/dL — ABNORMAL HIGH (ref 6–20)
CO2: 25 mmol/L (ref 22–32)
Calcium: 9 mg/dL (ref 8.9–10.3)
Chloride: 103 mmol/L (ref 98–111)
Creatinine, Ser: 1.23 mg/dL (ref 0.61–1.24)
GFR calc Af Amer: >60 mL/min (ref >60)
GFR calc non Af Amer: >60 mL/min (ref >60)
Glucose, Bld: 82 mg/dL (ref 70–99)
Potassium: 4.1 mmol/L (ref 3.5–5.1)
Sodium: 139 mmol/L (ref 135–145)

## 2018-12-18 MED ORDER — FUROSEMIDE 80 MG PO TABS: 120.0000 mg | ORAL_TABLET | Freq: Two times a day (BID) | ORAL | 3 refills | Status: AC

## 2018-12-18 MED ORDER — DOXYCYCLINE HYCLATE 100 MG PO TABS: 100.0000 mg | ORAL_TABLET | Freq: Two times a day (BID) | ORAL | 0 refills | Status: AC

## 2018-12-18 MED ORDER — POTASSIUM CHLORIDE CRYS ER 20 MEQ PO TBCR: 40.0000 meq | EXTENDED_RELEASE_TABLET | Freq: Every day | ORAL | Status: DC

## 2018-12-18 MED ORDER — SPIRONOLACTONE 100 MG PO TABS: 50.0000 mg | ORAL_TABLET | Freq: Every day | ORAL | 3 refills | Status: AC

## 2018-12-18 NOTE — Discharge Summary (Signed)
Physician Discharge Summary  Samuel KlippelMark T Fitzpatrick ZOX:[id number] DOB: 03/28/1966 DOA: 12/14/2018  PCP: Samuel GrippeKim, [name], MD  Admit date: 12/14/2018 Discharge date: 12/18/2018  Time spent: 35 minutes  Recommendations for Outpatient Follow-up:  1. Reassess blood pressure and volume status with further adjustment to diuretics and antihypertensive regimen. 2. Repeat basic metabolic panel to follow electrolytes and renal function 3. Continue closely monitoring and assisting patient with medication compliance and weight loss. 4. Repeat CBC to follow hemoglobin stability.   Discharge Diagnoses:  Active Problems:   Obesity, Class III, BMI 40-49.9 (morbid obesity) (HCC)   Chronic renal failure, stage 2 (mild)   Acute diastolic CHF (congestive heart failure) (HCC)   Cellulitis of right leg Essential hypertension Gastroesophageal reflux disease Presumed component of obesity hypoventilation syndrome  Discharge Condition: Stable and improved.  Patient discharged home with instruction to follow-up with PCP in 10 days.  Diet recommendation: Heart healthy and low calorie diet  Filed Weights   12/15/18 0200 12/16/18 0513 12/17/18 0641  Weight: (!) 167.8 kg (!) 163 kg (!) 160.9 kg    History of present illness:  As per H&P written by Dr. Sharl Fitzpatrick on 12/14/2018 53 y.o.male,with history of morbid obesity, hypertension, chronic leg edema, chronic combined diastolic and systolic CHF, atrial fibrillation on anticoagulation with Xarelto, sleep apnea on CPAP who was recently discharged from the hospital on 12/09/2018 after he was admitted for acute on chronic combined systolic and diastolic CHF and lower extremity cellulitis. Patient was discharged doxycycline. Patient says that the leg pain and swelling has not improved with antibiotics. He continued to complain of right lower extremity swelling with watery discharge from the right leg. He denies fever or chills. Patient also complains of intermittent chest pain along  with shortness of breath for past 2 days. Patient does have chronic combined [name] and diastolic CHF and takes Bumex 2 mg 3 times a day. He denies nausea vomiting or diarrhea.EKG showed nonspecific T wave abnormalities in lateral leads. Denies fever or chills. Denies contact with COVID-19 patients. SARS-CoV-2 is negative in the ED.  Hospital Course:  1-acute on chronic combined systolic and diastolic heart failure -Appears to be secondary to medication noncompliance and diet indiscretion -Still demonstrating some lower extremity edema but overall significantly improved.  No crackles on exam. -No orthopnea and good oxygen saturation on room air -Continue daily weights and low-sodium diet. -Continue to have good urine output on oral Lasix regimen; patient will be discharged 120 mg twice a day. -continue b-blocker, lisinopril and spironolactone  (dose has been adjusted to minimize chances of hypotension while taking higher dose of Lasix at discharge). -Most recent ejection fraction demonstrating 35-40%.  2-Elevated blood pressure in the setting of hypertension -Blood pressure stable and well-controlled currently -Continue to follow vital signs.  3-chronic atrial fibrillation -continue beta-blocker -Rate controlled -Continue Xarelto for secondary prevention.  4-dyslipidemia  -continue statin  5-gastroesophageal reflux disease -Continue PPI  6-chronic kidney disease stage II -Creatinine 1.2 at discharge and at baseline -Repeat basic metabolic panel follow-up visit to reassess electrolytes and renal function trend.  7-BPH -No complaints of urinary retention currently -Continue Flomax.  8-chronic respiratory failure/COPD -Appears stable and in no acute exacerbation. -Continue Flovent, continue as needed oxygen supplementation and continue as needed oxygen supplementation (patient reported not using it lately). -Patient most likely with component of obesity  hypoventilation syndrome. -Currently with good oxygen saturation on room air.  10-right lower extremity bruising/cellulitis -Continue oral doxycycline to complete antibiotic therapy. -No warmth sensation, no fever, currently  denies any pain. -oozing stopped after fluid overload/leg swelling improved.  11-morbid obesity -Body mass index is 48.11 kg/m. -low calorie diet, increase physical activity and portion control discussed with patient.  Procedures:  See below for x-ray reports.  Consultations:  Cardiology  Discharge Exam: Vitals:   12/18/18 0555 12/18/18 0730  BP: 112/84   Pulse: 76   Resp: 18   Temp: 98.5 F (36.9 C)   SpO2: 98% 97%    General: Afebrile, no chest pain, no nausea, no vomiting, no palpitations, reports no shortness of breath and no orthopnea.  Patient continued to describe good urine output and significant improvement in his lower extremity swelling.  No pain on his right lower leg. Cardiovascular: Rate control, no rubs, no gallops, no murmurs.  Unable to properly assess JVD due to body habitus. Respiratory: Decreased breath sounds at the bases, no frank crackles, no wheezing, good air movement bilaterally.  No using accessory muscles, normal respiratory efforts. Abdomen: Obese, nontender, nondistended, positive bowel sounds. Extremities: No cyanosis, no clubbing, no warm sensation, no oozing or drainage appreciated on his legs bilaterally.  2+ edema appreciated on both legs.  Discharge Instructions   Discharge Instructions    (HEART FAILURE PATIENTS) Call MD:  Anytime you have any of the following symptoms: 1) 3 pound weight gain in 24 hours or 5 pounds in 1 week 2) shortness of breath, with or without a dry hacking cough 3) swelling in the hands, feet or stomach 4) if you have to sleep on extra pillows at night in order to breathe.   Complete by: As directed    Diet - low sodium heart healthy   Complete by: As directed    Discharge instructions    Complete by: As directed    Take medications as prescribed Be compliant with medications and low-sodium diet as instructed (no more than 2 g of sodium daily) Maintain adequate hydration Check your weight on daily basis Arrange follow-up with PCP in 10 days.     Allergies as of 12/18/2018      Reactions   Banana Shortness Of Breath   Bee Venom Shortness Of Breath, Swelling, Other (See Comments)   Reaction:  Facial swelling   Strawberry Flavor Shortness Of Breath, Rash   Aspirin Other (See Comments)   Reaction:  GI upset    Metolazone Other (See Comments)   Pt states that he stopped taking this med due to heart attack like symptoms.    Oatmeal Nausea And Vomiting   Orange Juice [orange Oil] Nausea And Vomiting   All acidic products make him nauseous and upset stomach   Torsemide Swelling, Other (See Comments)   Reaction:  Swelling of feet/legs    Diltiazem Palpitations   Hydralazine Palpitations   Lipitor [atorvastatin] Other (See Comments)   Reaction:  Nose bleeds : Patient is currently taking this medication      Medication List    STOP taking these medications   bumetanide 2 MG tablet Commonly known as: BUMEX     TAKE these medications   acetaminophen 325 MG tablet Commonly known as: TYLENOL Take 2 tablets (650 mg total) by mouth every 4 (four) hours as needed for headache or mild pain.   albuterol (2.5 MG/3ML) 0.083% nebulizer solution Commonly known as: PROVENTIL Take 2.5 mg by nebulization every 6 (six) hours as needed for wheezing or shortness of breath.   atorvastatin 40 MG tablet Commonly known as: LIPITOR Take 40 mg by mouth daily.   carvedilol 25  MG tablet Commonly known as: COREG Take 1 tablet (25 mg total) by mouth 2 (two) times daily with a meal for 30 days.   docusate sodium 100 MG capsule Commonly known as: COLACE Take 1 capsule (100 mg total) by mouth 2 (two) times daily. What changed: when to take this   doxycycline 100 MG tablet Commonly  known as: VIBRA-TABS Take 1 tablet (100 mg total) by mouth every 12 (twelve) hours for 2 days.   ferrous gluconate 324 MG tablet Commonly known as: FERGON Take 1 tablet (324 mg total) by mouth 2 (two) times daily with a meal. What changed: when to take this   fluticasone 220 MCG/ACT inhaler Commonly known as: FLOVENT HFA Inhale 2 puffs into the lungs daily as needed (for shortness of breath).   furosemide 80 MG tablet Commonly known as: LASIX Take 1.5 tablets (120 mg total) by mouth 2 (two) times daily.   lisinopril 10 MG tablet Commonly known as: ZESTRIL Take 10 mg by mouth daily.   oxyCODONE-acetaminophen 5-325 MG tablet Commonly known as: PERCOCET/ROXICET Take 1 tablet by mouth every 6 (six) hours as needed.   pantoprazole 40 MG tablet Commonly known as: PROTONIX Take 1 tablet (40 mg total) by mouth daily at 6 (six) AM.   potassium chloride SA 20 MEQ tablet Commonly known as: K-DUR Take 2 tablets (40 mEq total) by mouth daily. What changed: when to take this   rivaroxaban 20 MG Tabs tablet Commonly known as: XARELTO Take 1 tablet (20 mg total) by mouth daily with breakfast. What changed: when to take this   silver sulfADIAZINE 1 % cream Commonly known as: SILVADENE Apply 1 application topically daily.   spironolactone 100 MG tablet Commonly known as: ALDACTONE Take 0.5 tablets (50 mg total) by mouth daily. For fluid What changed: how much to take   tamsulosin 0.4 MG Caps capsule Commonly known as: FLOMAX Take 1 capsule (0.4 mg total) by mouth daily.      Allergies  Allergen Reactions  . Banana Shortness Of Breath  . Bee Venom Shortness Of Breath, Swelling and Other (See Comments)    Reaction:  Facial swelling  . Strawberry Flavor Shortness Of Breath and Rash  . Aspirin Other (See Comments)    Reaction:  GI upset   . Metolazone Other (See Comments)    Pt states that he stopped taking this med due to heart attack like symptoms.   . Oatmeal Nausea And  Vomiting  . Orange Juice [Orange Oil] Nausea And Vomiting    All acidic products make him nauseous and upset stomach  . Torsemide Swelling and Other (See Comments)    Reaction:  Swelling of feet/legs   . Diltiazem Palpitations  . Hydralazine Palpitations  . Lipitor [Atorvastatin] Other (See Comments)    Reaction:  Nose bleeds : Patient is currently taking this medication   Follow-up Information    Samuel Grippe, MD. Schedule an appointment as soon as possible for a visit in 10 day(s).   Specialty: Internal Medicine Contact information: [address] [place] [address] [phone]        [name], MD .   Specialty: Cardiology Contact information: [address] [place] [address] [phone]           The results of significant diagnostics from this hospitalization (including imaging, microbiology, ancillary and laboratory) are listed below for reference.    Significant Diagnostic Studies: Ct Tibia Fibula Right W Contrast  Result Date:  12/08/2018 CLINICAL DATA:  Right lower leg pain, generalized edema EXAM: CT OF THE LOWER RIGHT EXTREMITY WITH CONTRAST TECHNIQUE: Multidetector CT imaging of the lower right extremity was performed according to the standard protocol following intravenous contrast administration. COMPARISON:  Plain films 12/05/2018 CONTRAST:  100mL OMNIPAQUE IOHEXOL 300 MG/ML  SOLN FINDINGS: Diffuse generalized edema throughout the right lower extremity. Dilated varicose veins noted throughout the right lower extremity. No focal fluid collection. No acute bony abnormality. No bone destruction to suggest osteomyelitis. No fracture, subluxation or dislocation. IMPRESSION: No acute bony abnormality. Diffuse generalized edema throughout the right lower extremity. Prominent varicose veins most noted medially within the right calf. Electronically Signed   By: Charlett NoseKevin  Dover M.D.   On: 12/08/2018 03:52   Dg Chest Portable 1 View  Result  Date: 12/14/2018 CLINICAL DATA:  Shortness of breath EXAM: PORTABLE CHEST 1 VIEW COMPARISON:  December 08, 2018 FINDINGS: The heart is significantly enlarged. There is evidence of vascular congestion and pulmonary edema. No pneumothorax. There may be small bilateral pleural effusions. Evaluation is limited by patient body habitus. IMPRESSION: Cardiomegaly with persistent vascular congestion. Electronically Signed   By: Katherine Mantlehristopher  Green M.D.   On: 12/14/2018 19:31   Dg Chest Portable 1 View  Result Date: 12/08/2018 CLINICAL DATA:  Shortness of breath. EXAM: PORTABLE CHEST 1 VIEW COMPARISON:  Multiple prior exams most recently 3 days prior 12/05/2018. FINDINGS: Cardiomegaly is unchanged. Unchanged mediastinal contours. Vascular congestion. No focal airspace disease. No large pleural effusion. No pneumothorax. Soft tissue attenuation from habitus limits assessment. IMPRESSION: Unchanged cardiomegaly with vascular congestion. Electronically Signed   By: Narda RutherfordMelanie  [place] M.D.   On: 12/08/2018 01:02   Dg Chest Portable 1 View  Result Date: 11/24/2018 CLINICAL DATA:  Increasing shortness of breath EXAM: PORTABLE CHEST 1 VIEW COMPARISON:  11/09/2018 FINDINGS: Cardiac shadow remains enlarged but stable. The lungs are well aerated bilaterally. Vascular congestion is again seen. No focal infiltrate is noted. No bony abnormality is seen. IMPRESSION: Mild vascular congestion.  No focal infiltrate is noted Electronically Signed   By: Alcide CleverMark  Lukens M.D.   On: 11/24/2018 07:46    Microbiology: Recent Results (from the past 240 hour(s))  Blood culture (routine x 2)     Status: None (Preliminary result)   Collection Time: 12/14/18  7:11 PM   Specimen: Right Antecubital; Blood  Result Value Ref Range Status   Specimen Description RIGHT ANTECUBITAL  Final   Special Requests   Final    BOTTLES DRAWN AEROBIC AND ANAEROBIC Blood Culture adequate volume   Culture   Final    NO GROWTH 4 DAYS Performed at [facility], [address] [place], [place] [address]    Report Status PENDING  Incomplete  Blood culture (routine x 2)     Status: None (Preliminary result)   Collection Time: 12/14/18  7:11 PM   Specimen: BLOOD LEFT HAND  Result Value Ref Range Status   Specimen Description BLOOD LEFT HAND  Final   Special Requests   Final    BOTTLES DRAWN AEROBIC AND ANAEROBIC Blood Culture adequate volume   Culture   Final    NO GROWTH 4 DAYS Performed at [facility], [address] [place], [place] [address]    Report Status PENDING  Incomplete  SARS Coronavirus 2 (CEPHEID - Performed in [facility] lab), Hosp Order     Status: None   Collection Time: 12/14/18  9:34 PM   Specimen: Nasopharyngeal Swab  Result Value Ref Range  Status   SARS Coronavirus 2 NEGATIVE NEGATIVE Final    Comment: (NOTE) If result is NEGATIVE SARS-CoV-2 target nucleic acids are NOT DETECTED. The SARS-CoV-2 RNA is generally detectable in upper and lower  respiratory specimens during the acute phase of infection. The lowest  concentration of SARS-CoV-2 viral copies this assay can detect is 250  copies / mL. A negative result does not preclude SARS-CoV-2 infection  and should not be used as the sole basis for treatment or other  patient management decisions.  A negative result may occur with  improper specimen collection / handling, submission of specimen other  than nasopharyngeal swab, presence of viral mutation(s) within the  areas targeted by this assay, and inadequate number of viral copies  (<250 copies / mL). A negative result must be combined with clinical  observations, patient history, and epidemiological information. If result is POSITIVE SARS-CoV-2 target nucleic acids are DETECTED. The SARS-CoV-2 RNA is generally detectable in upper and lower  respiratory specimens dur ing the acute phase of infection.  Positive  results are indicative of active infection with SARS-CoV-2.  Clinical  correlation with  patient history and other diagnostic information is  necessary to determine patient infection status.  Positive results do  not rule out bacterial infection or co-infection with other viruses. If result is PRESUMPTIVE POSTIVE SARS-CoV-2 nucleic acids MAY BE PRESENT.   A presumptive positive result was obtained on the submitted specimen  and confirmed on repeat testing.  While 2019 novel coronavirus  (SARS-CoV-2) nucleic acids may be present in the submitted sample  additional confirmatory testing may be necessary for epidemiological  and / or clinical management purposes  to differentiate between  SARS-CoV-2 and other Sarbecovirus currently known to infect humans.  If clinically indicated additional testing with an alternate test  methodology [phone]) is advised. The SARS-CoV-2 RNA is generally  detectable in upper and lower respiratory sp ecimens during the acute  phase of infection. The expected result is Negative. Fact Sheet for Patients:  StrictlyIdeas.no Fact Sheet for Healthcare Providers: BankingDealers.co.za This test is not yet approved or cleared by the [place] FDA and has been authorized for detection and/or diagnosis of SARS-CoV-2 by FDA under an Emergency Use Authorization (EUA).  This EUA will remain in effect (meaning this test can be used) for the duration of the COVID-19 declaration under Section 564(b)(1) of the Act, 21 U.S.C. section 360bbb-3(b)(1), unless the authorization is terminated or revoked sooner. Performed at [facility], [address] [place], [place] [address]   MRSA PCR Screening     Status: Abnormal   Collection Time: 12/15/18  1:57 AM   Specimen: Nasal Mucosa; Nasopharyngeal  Result Value Ref Range Status   MRSA by PCR POSITIVE (A) NEGATIVE Final    Comment:        The GeneXpert MRSA Assay (FDA approved for NASAL specimens only), is one component of a comprehensive MRSA  colonization surveillance program. It is not intended to diagnose MRSA infection nor to guide or monitor treatment for MRSA infections. RESULT CALLED TO, READ BACK BY AND VERIFIED WITH: DISHMON,M ON 12/15/18 AT 1530 BY LOY,C Performed at [facility], [address], [place], [place] [address]      Labs: Basic Metabolic Panel: Recent Labs  Lab 12/14/18 1840 12/15/18 0403 12/16/18 0501 12/17/18 0650 12/18/18 0550  NA 136 140 140 141 139  K 3.9 3.4* 3.8 4.1 4.1  CL 104 105 103 103 103  CO2 23 26 26 30 25   GLUCOSE  100* 94 87 91 82  BUN 21*  CREATININE 0.98 0.85 1.05 1.21 1.23  CALCIUM 8.4* 8.6* 8.9 9.2 9.0   Liver Function Tests: Recent Labs  Lab 12/14/18 1840 12/15/18 0403  AST 19 15  ALT 16 15  ALKPHOS 64 52  BILITOT 0.7 1.0  PROT 7.3 6.9  ALBUMIN 3.8 3.6   CBC: Recent Labs  Lab 12/14/18 1840 12/15/18 0403  WBC 4.6 3.6*  NEUTROABS 2.9  --   HGB 12.7* 12.6*  HCT 41.2 40.7  MCV 89.0 88.7  PLT 224 207    BNP (last 3 results) Recent Labs    11/24/18 0634 12/08/18 0022 12/14/18 1840  BNP 281.0* 224.0* 303.0*    Signed:  Vassie Loll MD.  [facility] 12/18/2018, 11:30 AM

## 2018-12-18 NOTE — Plan of Care (Signed)
  Problem: Health Behavior/Discharge Planning: Goal: Ability to manage health-related needs will improve Outcome: Progressing  Patient resistive to dietary measures to reduce fluid overload. (CSR)   Problem: Education: Goal: Knowledge of General Education information will improve Description: Including pain rating scale, medication(s)/side effects and non-pharmacologic comfort measures Outcome: Adequate for Discharge   Problem: Clinical Measurements: Goal: Ability to maintain clinical measurements within normal limits will improve Outcome: Adequate for Discharge Goal: Will remain free from infection Outcome: Adequate for Discharge Goal: Diagnostic test results will improve Outcome: Adequate for Discharge Goal: Respiratory complications will improve Outcome: Adequate for Discharge Goal: Cardiovascular complication will be avoided Outcome: Adequate for Discharge   Problem: Activity: Goal: Risk for activity intolerance will decrease Outcome: Adequate for Discharge   Problem: Nutrition: Goal: Adequate nutrition will be maintained Outcome: Adequate for Discharge   Problem: Coping: Goal: Level of anxiety will decrease Outcome: Adequate for Discharge   Problem: Elimination: Goal: Will not experience complications related to bowel motility Outcome: Adequate for Discharge Goal: Will not experience complications related to urinary retention Outcome: Adequate for Discharge   Problem: Pain Managment: Goal: General experience of comfort will improve Outcome: Adequate for Discharge   Problem: Safety: Goal: Ability to remain free from injury will improve Outcome: Adequate for Discharge   Problem: Skin Integrity: Goal: Risk for impaired skin integrity will decrease Outcome: Adequate for Discharge   Problem: Education: Goal: Ability to verbalize understanding of medication therapies will improve Outcome: Adequate for Discharge Goal: Individualized Educational  Video(s) Outcome: Adequate for Discharge   Problem: Activity: Goal: Capacity to carry out activities will improve Outcome: Adequate for Discharge   Problem: Cardiac: Goal: Ability to achieve and maintain adequate cardiopulmonary perfusion will improve Outcome: Adequate for Discharge

## 2018-12-18 NOTE — Progress Notes (Signed)
Patient readied for discharge. Bathed and dressed self. Verbalized understanding of discharge instructions. Has called for his ride home. Belongings gathered at with patient. Denies complaints

## 2018-12-18 NOTE — TOC Transition Note (Signed)
Transition of Care [facility]) - CM/SW Discharge Note   Patient Details  Name: [name] MRN: [id number] Date of Birth: 11/28/1965  Transition of Care [facility]) CM/SW Contact:  Latanya Maudlin, RN Phone Number: 12/18/2018, 12:18 PM   Clinical Narrative:  Patient to be discharged per MD order. Orders in place for home health services. Previous RNCM had began workup for home health via [facility]. Notified [place] of discharge. No DME needs      Final next level of care: Home w Home Health Services Barriers to Discharge: No Barriers Identified   Patient Goals and CMS Choice Patient states their goals for this hospitalization and ongoing recovery are:: To return home CMS Medicare.gov Compare Post Acute Care list provided to:: Patient Choice offered to / list presented to : Patient  Discharge Placement                       Discharge Plan and Services                          HH Arranged: RN, PT [facility] Agency: [facility] [place] Date [facility] Agency Contacted: 12/18/18 Time [place]: 1218 Representative spoke with at [place]: [place]) Interventions     Readmission Risk Interventions Readmission Risk Prevention Plan 12/16/2018 11/26/2018 11/25/2018  Transportation Screening Complete Complete Complete  Medication Review Press photographer) Complete Complete Complete  PCP or Specialist appointment within 3-5 days of discharge - Not Complete Not Complete  HRI or [place] Complete Complete Complete  HRI or Home Care Consult Pt Refusal Comments - - -  SW Recovery Care/Counseling Consult Complete Complete Complete  Palliative Care Screening Not Applicable Not Applicable Not [place] Not Applicable Not Applicable Not Applicable  Some recent data might be hidden

## 2018-12-19 LAB — CULTURE, BLOOD (ROUTINE X 2)
Culture: NO GROWTH
Culture: NO GROWTH
Special Requests: ADEQUATE
Special Requests: ADEQUATE

## 2018-12-20 ENCOUNTER — Other Ambulatory Visit: Payer: Self-pay | Admitting: Pharmacist

## 2018-12-20 NOTE — Patient Outreach (Signed)
[facility]) Care Management  [facility]   12/20/2018  [name] January 15, 1966 [id number]  Reason for referral: Medication Reconciliation Post Discharge  Referral source: [facility] RN Current insurance: [facility]  PMHx includes but not limited to:   morbid obesity, hypertension, chronic leg edema, chronic combined diastolic and systolic CHF, atrial fibrillation on anticoagulation with Xarelto, sleep apnea on CPAP with multiple hospitalizations for CHF exacerbation and lower extremity cellulitis.   Outreach:  Successful telephone call with patient. HIPAA identifiers verified.   Subjective:  Does the patient ever forget to take medication?  yes Does the patient have problems obtaining medications due to transportation?   no Does the patient have problems obtaining medications due to cost?  no  Does the patient feel that medications prescribed are effective?  no Does the patient ever experience any side effects to the medications prescribed?  no  Does the patient measure his/her own blood glucose at home?  No Does the patient measure his/her own blood pressure at home? No   Objective: The ASCVD Risk score Denman George DC Jr., et al., 2013) failed to calculate for the following reasons:   Cannot find a previous HDL lab   Cannot find a previous total cholesterol lab  Lab Results  Component Value Date   CREATININE 1.23 12/18/2018   CREATININE 1.21 12/17/2018   CREATININE 1.05 12/16/2018    Lab Results  Component Value Date   HGBA1C 5.7 (H) 04/04/2016    Lipid Panel     Component Value Date/Time   CHOL 139 06/14/2015 0328   TRIG 58 06/14/2015 0328   HDL 28 (L) 06/14/2015 0328   CHOLHDL 5.0 06/14/2015 0328   VLDL 12 06/14/2015 0328   LDLCALC 99 06/14/2015 0328    BP Readings from Last 3 Encounters:  12/18/18 112/84  12/09/18 114/73  11/30/18 (!) 156/108    Allergies  Allergen Reactions  . Banana Shortness Of Breath  . Bee Venom  Shortness Of Breath, Swelling and Other (See Comments)    Reaction:  Facial swelling  . Strawberry Flavor Shortness Of Breath and Rash  . Aspirin Other (See Comments)    Reaction:  GI upset   . Metolazone Other (See Comments)    Pt states that he stopped taking this med due to heart attack like symptoms.   . Oatmeal Nausea And Vomiting  . Orange Juice [Orange Oil] Nausea And Vomiting    All acidic products make him nauseous and upset stomach  . Torsemide Swelling and Other (See Comments)    Reaction:  Swelling of feet/legs   . Diltiazem Palpitations  . Hydralazine Palpitations  . Lipitor [Atorvastatin] Other (See Comments)    Reaction:  Nose bleeds : Patient is currently taking this medication    Medications Reviewed Today    Reviewed by [name], [facility] (Pharmacist) on 12/20/18 at 1250  Med List Status: <None>  Medication Order Taking? Sig Documenting Provider Last Dose Status Informant  acetaminophen (TYLENOL) 325 MG tablet [id number] Yes Take 2 tablets (650 mg total) by mouth every 4 (four) hours as needed for headache or mild pain. Calvert Cantor, MD Taking Active Self  albuterol (PROVENTIL) (2.5 MG/3ML) 0.083% nebulizer solution [id number] Yes Take 2.5 mg by nebulization every 6 (six) hours as needed for wheezing or shortness of breath. [provider] Taking Active Self           Med Note Allyne Gee, Irven Easterly Mar 04, 2017  8:53  PM)    atorvastatin (LIPITOR) 40 MG tablet [id number] Yes Take 40 mg by mouth daily.  [provider] Taking Active Self  carvedilol (COREG) 25 MG tablet [id number] Yes Take 1 tablet (25 mg total) by mouth 2 (two) times daily with a meal for 30 days. Sherryll BurgerShah, Pratik D, DO Taking Active Self  docusate sodium (COLACE) 100 MG capsule [id number] Yes Take 1 capsule (100 mg total) by mouth 2 (two) times daily.  Patient taking differently: Take 100 mg by mouth every morning.    Kathlen ModyAkula, Vijaya, MD Taking Active Self  doxycycline (VIBRA-TABS) 100 MG tablet  [id number] Yes Take 1 tablet (100 mg total) by mouth every 12 (twelve) hours for 2 days. Vassie LollMadera, Carlos, MD Taking Active   ferrous gluconate [facility](FERGON) 324 MG tablet [id number] Yes Take 1 tablet (324 mg total) by mouth 2 (two) times daily with a meal.  Patient taking differently: Take 324 mg by mouth daily.    Kathlen ModyAkula, Vijaya, MD Taking Active Self  fluticasone (FLOVENT HFA) 220 MCG/ACT inhaler [id number] Yes Inhale 2 puffs into the lungs daily as needed (for shortness of breath).  [provider] Taking Active Self  furosemide (LASIX) 80 MG tablet [id number] Yes Take 1.5 tablets (120 mg total) by mouth 2 (two) times daily. Vassie LollMadera, Carlos, MD Taking Active   lisinopril (ZESTRIL) 10 MG tablet [id number] Yes Take 10 mg by mouth daily. [provider] Taking Active Self  oxyCODONE-acetaminophen (PERCOCET/ROXICET) 5-325 MG tablet [id number] Yes Take 1 tablet by mouth every 6 (six) hours as needed. Bethann BerkshireZammit, Joseph, MD Taking Active Self  pantoprazole (PROTONIX) 40 MG tablet [id number] Yes Take 1 tablet (40 mg total) by mouth daily at 6 (six) AM. Kathlen ModyAkula, Vijaya, MD Taking Active Self        Discontinued 09/03/15 0754 (Entry Error)   potassium chloride SA (K-DUR) 20 MEQ tablet [id number] Yes Take 2 tablets (40 mEq total) by mouth daily. Vassie LollMadera, Carlos, MD Taking Active   rivaroxaban (XARELTO) 20 MG TABS tablet [id number] Yes Take 1 tablet (20 mg total) by mouth daily with breakfast.  Patient taking differently: Take 20 mg by mouth daily with supper.    Kathlen ModyAkula, Vijaya, MD Taking Active Self           Med Note Dorothy Puffer(BAILEY, KATHERINE E   Sun Sep 18, 2018  9:10 AM)    silver sulfADIAZINE (SILVADENE) 1 % cream [id number] Yes Apply 1 application topically daily. Maxwell CaulLayden, Lindsey A, PA-C Taking Active Self  spironolactone (ALDACTONE) 100 MG tablet [id number] Yes Take 0.5 tablets (50 mg total) by mouth daily. For fluid Vassie LollMadera, Carlos, MD Taking Active   tamsulosin [facility](FLOMAX) 0.4 MG CAPS capsule [id number] Yes Take 1  capsule (0.4 mg total) by mouth daily. Kathlen ModyAkula, Vijaya, MD Taking Active Self          ASSESSMENT: Date Discharged from Hospital: 12/18/2018 Date Medication Reconciliation Performed: 12/20/2018  Medications:  New at Discharge: . Furosemide 120 mg BID . Doxycycline 100 mg BID  Adjustments at Discharge: . Potassium Chloride (now 40 Meq daily) . Spironolactone (decreased to 50 mg daily)  Discontinued at Discharge:   Bumetanide 2 mg   Patient was recently discharged from hospital and all medications have been reviewed.  Drugs sorted by system:  Cardiovascular: Atorvastatin, Furosemide, Carvedilol,  Lisinopril, Xarelto, Spironolactone,   Pulmonary/Allergy: Albuterol Nebulizer Solution, Fluticasone HFA, Oxygen,   Gastrointestinal: Docusate Sodium, Pantoprazole,   Pain: Acetaminophen, Oxycodone/APAP  Genitourinary: Tamsulosin  Topical: Silver Sulfadiazine Cream  Vitamins/Minerals/Supplements: Ferrous Gluconate, Potassium Chloride  Medication Adherence Findings: Adherence Review  []  Excellent (no doses missed/week)     []  Good (no more than 1 dose missed/week)     [x]  Partial (2-3 doses missed/week) []  Poor (>3 doses missed/week)  Patient with good understanding of regimen and poor understanding of indications.    Patient reported taking Furosemide 80mg  1&1/2 tablets twice daily as prescribed.  He reported his weight as 360 pounds today. He was 353 pounds on 09/16/2018 (while hospitalized).  Patient said he was trying to watch what he eats. He reported eating "Progresso" soup instead of his normal meals and trying to stay away from bread.    Although the soup still has quite a bit of sodium, patient was encouraged to continue with positive lifestyle changes.  When asked about the effectiveness of his medication regimen. Patient said "time will tell but I am going to do what I need to do:".  Brand name medications were discussed but the patient said he no  longer wanted to pursue an override because he would have a higher copay and he was unwilling to pay any extra.  Plan: Consulted via telephone with Alexander Nurse, Marykay Lex. Call patient back in 1 week.   Elayne Guerin, PharmD, [place] Clinical Pharmacist [phone]

## 2018-12-21 DIAGNOSIS — Z7901 Long term (current) use of anticoagulants: Secondary | ICD-10-CM | POA: Diagnosis not present

## 2018-12-21 DIAGNOSIS — I482 Chronic atrial fibrillation, unspecified: Secondary | ICD-10-CM | POA: Diagnosis not present

## 2018-12-21 DIAGNOSIS — I890 Lymphedema, not elsewhere classified: Secondary | ICD-10-CM | POA: Diagnosis not present

## 2018-12-21 DIAGNOSIS — I5042 Chronic combined systolic (congestive) and diastolic (congestive) heart failure: Secondary | ICD-10-CM | POA: Diagnosis not present

## 2018-12-21 DIAGNOSIS — I10 Essential (primary) hypertension: Secondary | ICD-10-CM | POA: Diagnosis not present

## 2018-12-22 ENCOUNTER — Other Ambulatory Visit: Payer: Self-pay

## 2018-12-22 NOTE — Patient Outreach (Signed)
[name]) Care Management  12/22/2018  VITO BEG 02/20/1966 [id number]    Unsuccessful outreach attempt.Phone rang without option to leave a voice message. Will attempt outreach within the next 3-4 business days.    [place] [phone]

## 2018-12-25 IMAGING — CR DG CHEST 1V PORT
1 series · 1 of 1 positions shown · non-contrast
Comparison: 04/16/2017

CLINICAL DATA: Shortness of breath, swelling

EXAM:
PORTABLE CHEST 1 VIEW

[portable]
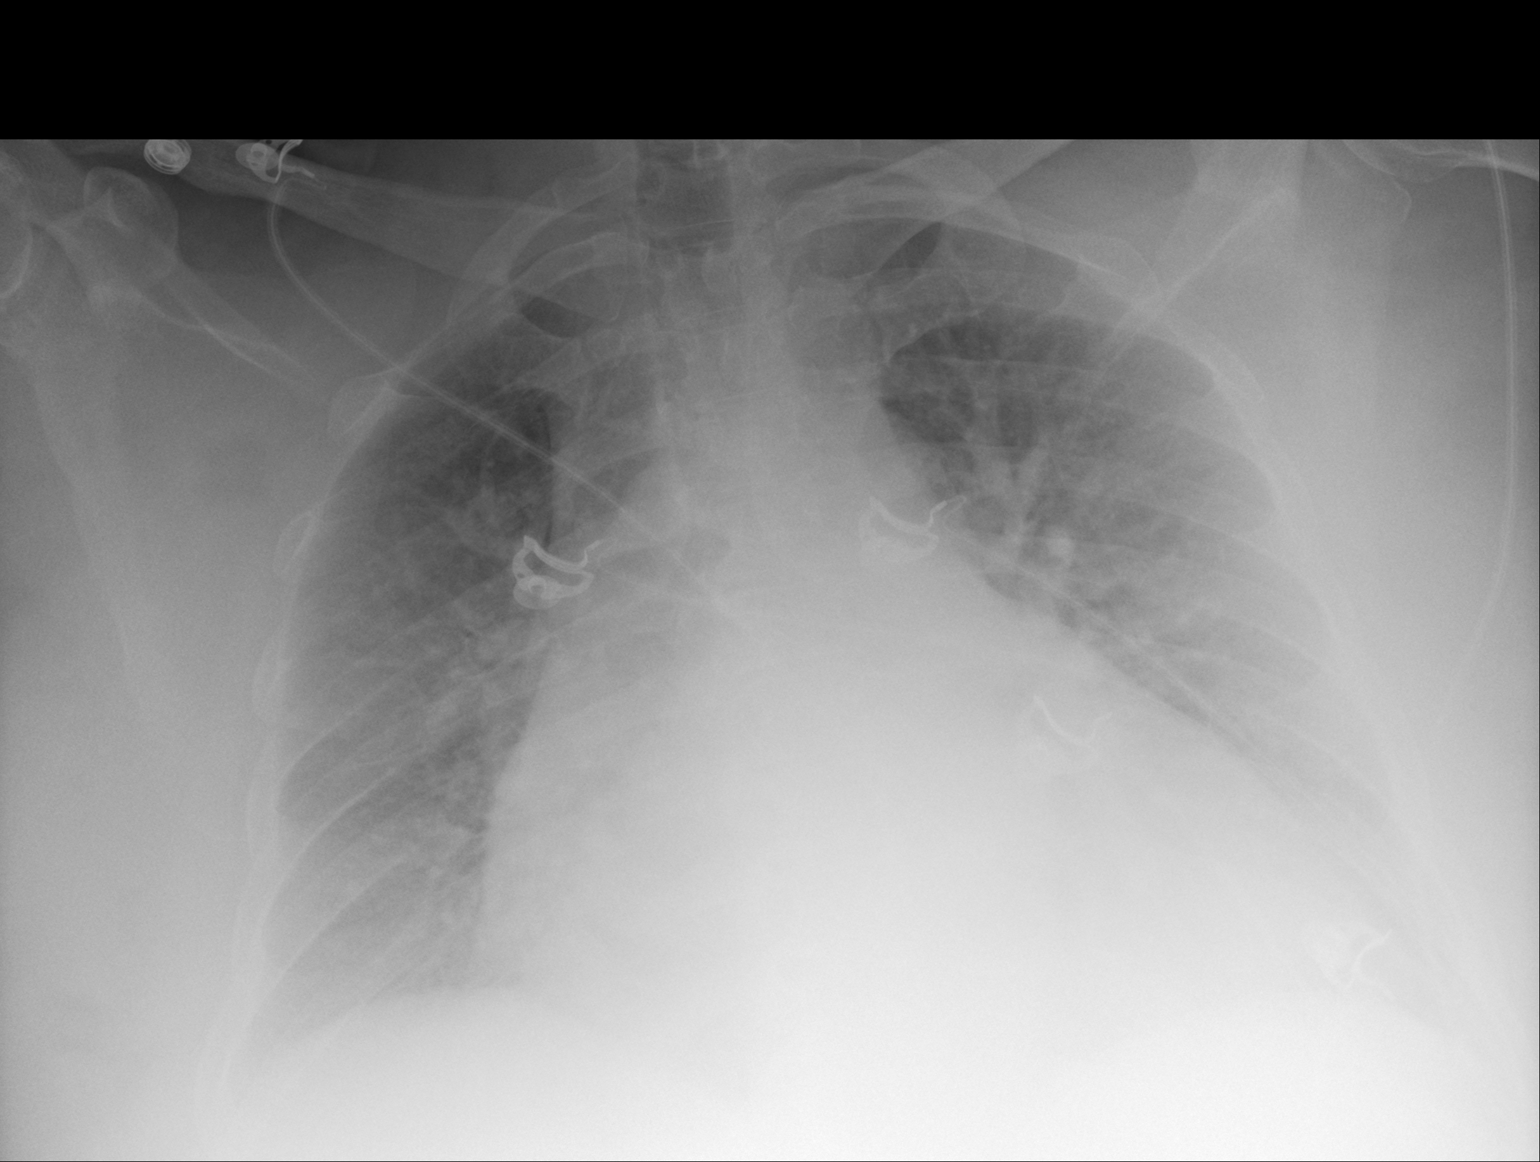

[1 of 1 positions shown; findings below may reference images not displayed]

FINDINGS: Cardiomegaly with pulmonary vascular congestion. Very mild
interstitial edema is possible. No definite pleural effusions. No
pneumothorax.
IMPRESSION: Cardiomegaly with pulmonary vascular congestion and possible mild
interstitial edema.

## 2018-12-26 ENCOUNTER — Ambulatory Visit: Payer: Self-pay | Admitting: Pharmacist

## 2018-12-26 ENCOUNTER — Other Ambulatory Visit: Payer: Self-pay | Admitting: Pharmacist

## 2018-12-26 NOTE — Patient Outreach (Signed)
[facility]) Care Management  12/26/2018  [name] 02-16-1966 [id number]   Patient was called for follow up. Unfortunately, he did not answer his phone and it rang more than 20 times without voicemail pick up.     Plan: Call patient back in 7-10 business days.   Elayne Guerin, PharmD, [place] Clinical Pharmacist [phone]

## 2018-12-27 ENCOUNTER — Other Ambulatory Visit: Payer: Self-pay

## 2018-12-27 IMAGING — CR DG CHEST 1V PORT
1 series · 1 of 1 positions shown · non-contrast
Comparison: April 19, 2017

CLINICAL DATA: Shortness of Breath

EXAM:
PORTABLE CHEST 1 VIEW

[portable]
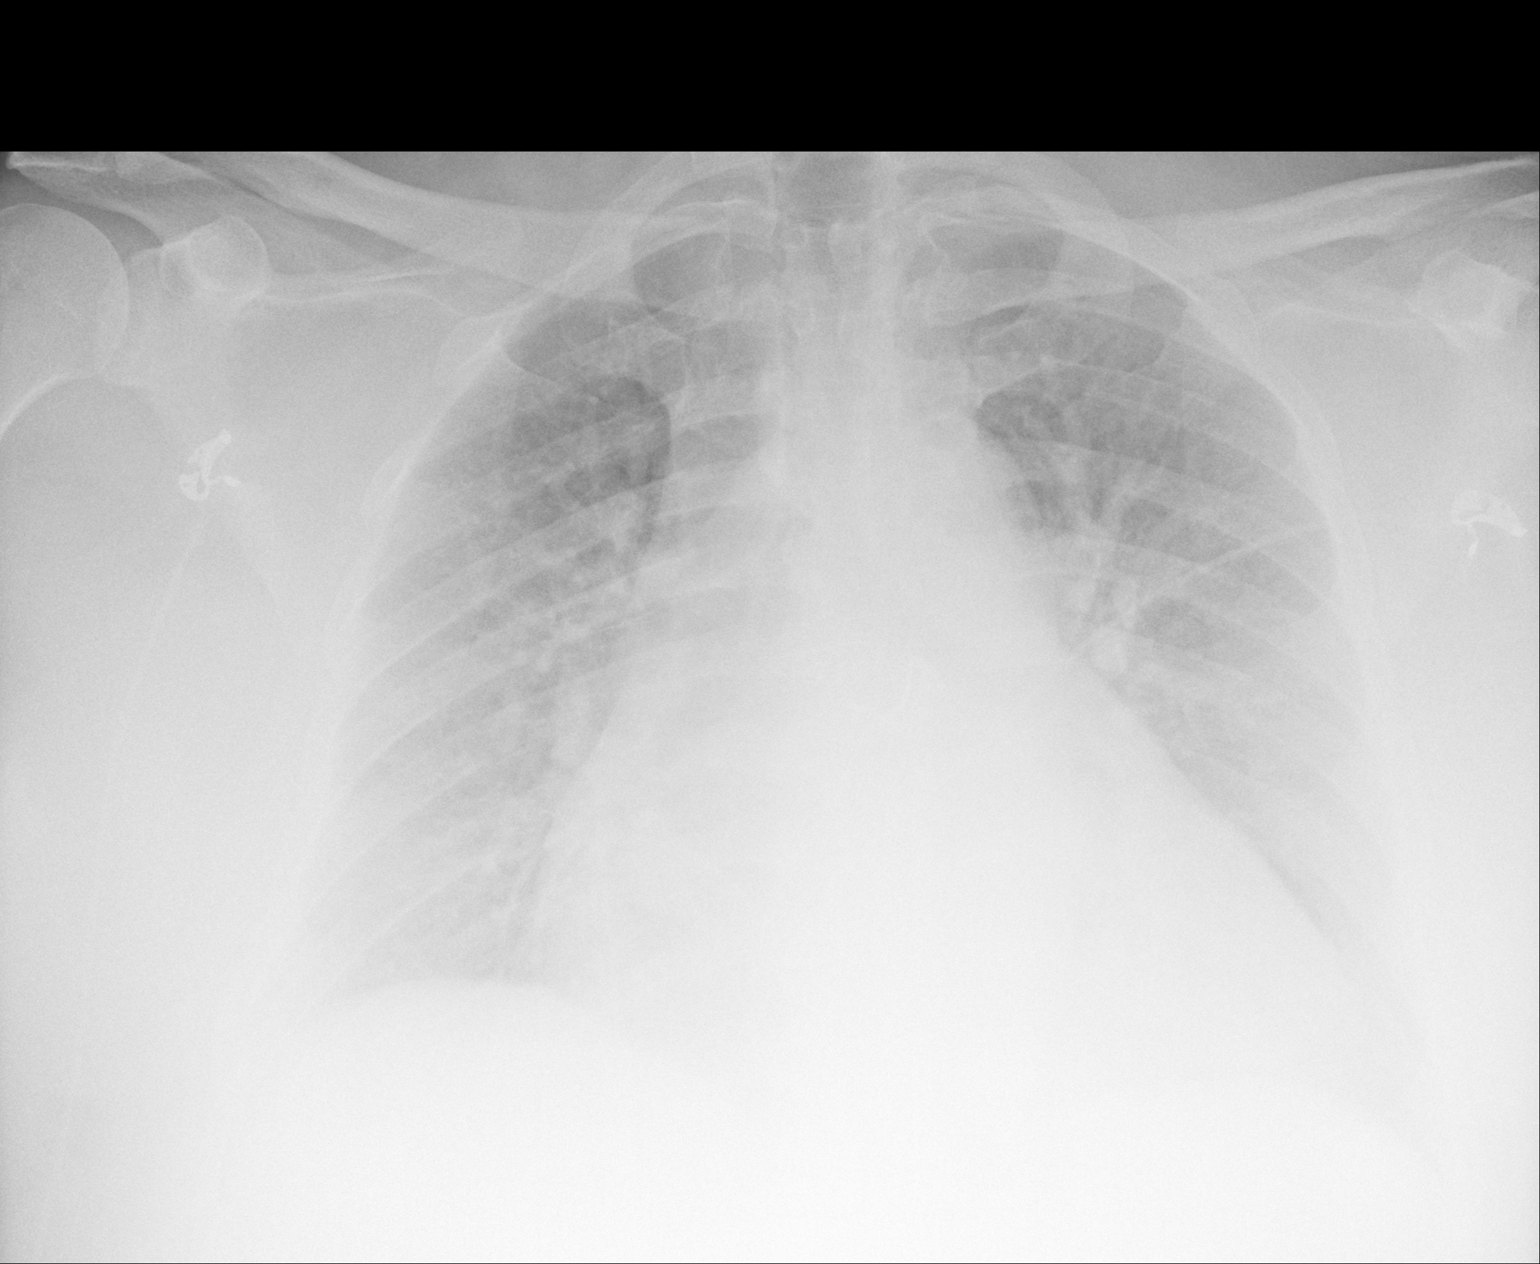

[1 of 1 positions shown; findings below may reference images not displayed]

FINDINGS: There is stable cardiomegaly with pulmonary venous hypertension.
There is no frank edema or consolidation. No adenopathy appreciable.
No bone lesions. No appreciable pleural effusion.
IMPRESSION: Persistent pulmonary vascular congestion without edema or
consolidation evident.

## 2018-12-27 NOTE — Patient Outreach (Signed)
[facility]) Care Management  12/27/2018  [name] 06-11-1966 [id number]    Unsuccessful outreach attempt. Phone rang without option to leave a voice message.    PLAN -Will send unsuccessful outreach letter. -Will attempt outreach later this week.    [place] [phone]

## 2018-12-28 DIAGNOSIS — I480 Paroxysmal atrial fibrillation: Secondary | ICD-10-CM | POA: Diagnosis not present

## 2018-12-28 DIAGNOSIS — I10 Essential (primary) hypertension: Secondary | ICD-10-CM | POA: Diagnosis not present

## 2018-12-28 DIAGNOSIS — I5042 Chronic combined systolic (congestive) and diastolic (congestive) heart failure: Secondary | ICD-10-CM | POA: Diagnosis not present

## 2018-12-28 DIAGNOSIS — I509 Heart failure, unspecified: Secondary | ICD-10-CM | POA: Diagnosis not present

## 2018-12-28 DIAGNOSIS — I890 Lymphedema, not elsewhere classified: Secondary | ICD-10-CM | POA: Diagnosis not present

## 2018-12-29 DIAGNOSIS — E785 Hyperlipidemia, unspecified: Secondary | ICD-10-CM | POA: Diagnosis not present

## 2018-12-29 DIAGNOSIS — I130 Hypertensive heart and chronic kidney disease with heart failure and stage 1 through stage 4 chronic kidney disease, or unspecified chronic kidney disease: Secondary | ICD-10-CM | POA: Diagnosis not present

## 2018-12-29 DIAGNOSIS — I5042 Chronic combined systolic (congestive) and diastolic (congestive) heart failure: Secondary | ICD-10-CM | POA: Diagnosis not present

## 2018-12-29 DIAGNOSIS — I482 Chronic atrial fibrillation, unspecified: Secondary | ICD-10-CM | POA: Diagnosis not present

## 2018-12-29 DIAGNOSIS — K219 Gastro-esophageal reflux disease without esophagitis: Secondary | ICD-10-CM | POA: Diagnosis not present

## 2018-12-29 DIAGNOSIS — I252 Old myocardial infarction: Secondary | ICD-10-CM | POA: Diagnosis not present

## 2018-12-29 DIAGNOSIS — I872 Venous insufficiency (chronic) (peripheral): Secondary | ICD-10-CM | POA: Diagnosis not present

## 2018-12-29 DIAGNOSIS — R238 Other skin changes: Secondary | ICD-10-CM | POA: Diagnosis not present

## 2018-12-29 DIAGNOSIS — J9611 Chronic respiratory failure with hypoxia: Secondary | ICD-10-CM | POA: Diagnosis not present

## 2018-12-29 DIAGNOSIS — I890 Lymphedema, not elsewhere classified: Secondary | ICD-10-CM | POA: Diagnosis not present

## 2018-12-29 DIAGNOSIS — G4733 Obstructive sleep apnea (adult) (pediatric): Secondary | ICD-10-CM | POA: Diagnosis not present

## 2018-12-29 DIAGNOSIS — M1990 Unspecified osteoarthritis, unspecified site: Secondary | ICD-10-CM | POA: Diagnosis not present

## 2018-12-29 DIAGNOSIS — L97811 Non-pressure chronic ulcer of other part of right lower leg limited to breakdown of skin: Secondary | ICD-10-CM | POA: Diagnosis not present

## 2018-12-29 DIAGNOSIS — I472 Ventricular tachycardia: Secondary | ICD-10-CM | POA: Diagnosis not present

## 2018-12-29 DIAGNOSIS — N182 Chronic kidney disease, stage 2 (mild): Secondary | ICD-10-CM | POA: Diagnosis not present

## 2018-12-29 DIAGNOSIS — I429 Cardiomyopathy, unspecified: Secondary | ICD-10-CM | POA: Diagnosis not present

## 2018-12-29 DIAGNOSIS — D631 Anemia in chronic kidney disease: Secondary | ICD-10-CM | POA: Diagnosis not present

## 2018-12-29 DIAGNOSIS — J449 Chronic obstructive pulmonary disease, unspecified: Secondary | ICD-10-CM | POA: Diagnosis not present

## 2018-12-29 DIAGNOSIS — K279 Peptic ulcer, site unspecified, unspecified as acute or chronic, without hemorrhage or perforation: Secondary | ICD-10-CM | POA: Diagnosis not present

## 2018-12-29 DIAGNOSIS — I272 Pulmonary hypertension, unspecified: Secondary | ICD-10-CM | POA: Diagnosis not present

## 2018-12-29 DIAGNOSIS — I739 Peripheral vascular disease, unspecified: Secondary | ICD-10-CM | POA: Diagnosis not present

## 2018-12-30 ENCOUNTER — Emergency Department (HOSPITAL_COMMUNITY)
Admission: EM | Admit: 2018-12-30 | Discharge: 2018-12-30 | Disposition: A | Discharge status: Home / Self Care | Payer: Medicare Other | Attending: Emergency Medicine | Admitting: Emergency Medicine

## 2018-12-30 ENCOUNTER — Other Ambulatory Visit: Payer: Self-pay | Admitting: Pharmacist

## 2018-12-30 ENCOUNTER — Other Ambulatory Visit: Payer: Self-pay

## 2018-12-30 ENCOUNTER — Emergency Department (HOSPITAL_COMMUNITY)
Admission: EM | Admit: 2018-12-30 | Discharge: 2018-12-31 | Disposition: A | Discharge status: Home / Self Care | Payer: Medicare Other | Source: Home / Self Care | Attending: Emergency Medicine | Admitting: Emergency Medicine

## 2018-12-30 ENCOUNTER — Emergency Department (HOSPITAL_COMMUNITY): Payer: Medicare Other

## 2018-12-30 ENCOUNTER — Encounter (HOSPITAL_COMMUNITY): Payer: Self-pay | Admitting: Emergency Medicine

## 2018-12-30 ENCOUNTER — Encounter (HOSPITAL_COMMUNITY): Payer: Self-pay

## 2018-12-30 DIAGNOSIS — T887XXA Unspecified adverse effect of drug or medicament, initial encounter: Secondary | ICD-10-CM | POA: Insufficient documentation

## 2018-12-30 DIAGNOSIS — Z79899 Other long term (current) drug therapy: Secondary | ICD-10-CM | POA: Insufficient documentation

## 2018-12-30 DIAGNOSIS — R0602 Shortness of breath: Secondary | ICD-10-CM | POA: Insufficient documentation

## 2018-12-30 DIAGNOSIS — I4891 Unspecified atrial fibrillation: Secondary | ICD-10-CM | POA: Diagnosis not present

## 2018-12-30 DIAGNOSIS — R069 Unspecified abnormalities of breathing: Secondary | ICD-10-CM | POA: Diagnosis not present

## 2018-12-30 DIAGNOSIS — Z7901 Long term (current) use of anticoagulants: Secondary | ICD-10-CM | POA: Insufficient documentation

## 2018-12-30 DIAGNOSIS — T501X5A Adverse effect of loop [high-ceiling] diuretics, initial encounter: Secondary | ICD-10-CM | POA: Diagnosis not present

## 2018-12-30 DIAGNOSIS — I509 Heart failure, unspecified: Secondary | ICD-10-CM | POA: Diagnosis not present

## 2018-12-30 DIAGNOSIS — R2241 Localized swelling, mass and lump, right lower limb: Secondary | ICD-10-CM | POA: Diagnosis present

## 2018-12-30 DIAGNOSIS — Z87891 Personal history of nicotine dependence: Secondary | ICD-10-CM | POA: Insufficient documentation

## 2018-12-30 DIAGNOSIS — J45909 Unspecified asthma, uncomplicated: Secondary | ICD-10-CM | POA: Insufficient documentation

## 2018-12-30 DIAGNOSIS — I10 Essential (primary) hypertension: Secondary | ICD-10-CM | POA: Diagnosis not present

## 2018-12-30 DIAGNOSIS — R600 Localized edema: Secondary | ICD-10-CM | POA: Diagnosis not present

## 2018-12-30 DIAGNOSIS — R000 Tachycardia, unspecified: Secondary | ICD-10-CM | POA: Diagnosis not present

## 2018-12-30 DIAGNOSIS — R4182 Altered mental status, unspecified: Secondary | ICD-10-CM | POA: Insufficient documentation

## 2018-12-30 DIAGNOSIS — I110 Hypertensive heart disease with heart failure: Secondary | ICD-10-CM | POA: Insufficient documentation

## 2018-12-30 DIAGNOSIS — Y658 Other specified misadventures during surgical and medical care: Secondary | ICD-10-CM | POA: Insufficient documentation

## 2018-12-30 DIAGNOSIS — R609 Edema, unspecified: Secondary | ICD-10-CM

## 2018-12-30 DIAGNOSIS — M7989 Other specified soft tissue disorders: Secondary | ICD-10-CM | POA: Diagnosis not present

## 2018-12-30 DIAGNOSIS — I959 Hypotension, unspecified: Secondary | ICD-10-CM | POA: Diagnosis not present

## 2018-12-30 DIAGNOSIS — T402X5A Adverse effect of other opioids, initial encounter: Secondary | ICD-10-CM | POA: Diagnosis not present

## 2018-12-30 DIAGNOSIS — I5042 Chronic combined systolic (congestive) and diastolic (congestive) heart failure: Secondary | ICD-10-CM | POA: Insufficient documentation

## 2018-12-30 LAB — I-STAT CHEM 8, ED
BUN: 12 mg/dL (ref 6–20)
Calcium, Ion: 1.14 mmol/L — ABNORMAL LOW (ref 1.15–1.40)
Chloride: 105 mmol/L (ref 98–111)
Creatinine, Ser: 1 mg/dL (ref 0.61–1.24)
Glucose, Bld: 97 mg/dL (ref 70–99)
HCT: 38 % — ABNORMAL LOW (ref 39.0–52.0)
Hemoglobin: 12.9 g/dL — ABNORMAL LOW (ref 13.0–17.0)
Potassium: 3.9 mmol/L (ref 3.5–5.1)
Sodium: 140 mmol/L (ref 135–145)
TCO2: 26 mmol/L (ref 22–32)

## 2018-12-30 MED ORDER — NALOXONE HCL 2 MG/2ML IJ SOSY
1.0000 mg | PREFILLED_SYRINGE | Freq: Once | INTRAMUSCULAR | Status: AC
  Administered 2018-12-30: 1 mg via INTRAVENOUS

## 2018-12-30 MED ORDER — NALOXONE HCL 2 MG/2ML IJ SOSY
PREFILLED_SYRINGE | INTRAMUSCULAR | Status: AC
  Filled 2018-12-30: qty 2

## 2018-12-30 MED ORDER — FUROSEMIDE 40 MG PO TABS
80.0000 mg | ORAL_TABLET | Freq: Once | ORAL | Status: AC
  Administered 2018-12-30: 80 mg via ORAL
  Filled 2018-12-30: qty 2

## 2018-12-30 MED ORDER — OXYCODONE-ACETAMINOPHEN 5-325 MG PO TABS
2.0000 | ORAL_TABLET | Freq: Once | ORAL | Status: AC
  Administered 2018-12-30: 20:00:00 2 via ORAL
  Filled 2018-12-30: qty 2

## 2018-12-30 MED ORDER — OXYCODONE-ACETAMINOPHEN 5-325 MG PO TABS: 1.0000 | ORAL_TABLET | Freq: Four times a day (QID) | ORAL | 0 refills | Status: DC | PRN

## 2018-12-30 NOTE — ED Provider Notes (Signed)
[facility] EMERGENCY DEPARTMENT Provider Note   CSN: [id number] Arrival date & time: 12/30/18  2141     History   Chief Complaint Chief Complaint  Patient presents with  . Shortness of Breath    HPI WINSLOW ARTIAGA is a 53 y.o. male.     Patient was seen earlier today with pain in his right leg.  He was given 2 Percocets.  The patient was brought here after being at home for a short period time because he became sleepy.  The patient states that sometimes he takes Percocet and Lasix together and makes him lethargic.  He did take his Lasix.  The history is provided by the patient. No language interpreter was used.  Altered Mental Status Presenting symptoms: behavior changes   Severity:  Moderate Most recent episode:  Today Episode history:  Single Timing:  Constant Progression:  Waxing and waning Chronicity:  New Context: not alcohol use   Associated symptoms: no abdominal pain, no hallucinations, no headaches, no rash and no seizures     Past Medical History:  Diagnosis Date  . Allergic rhinitis   . Asthma   . Atrial fibrillation (HCC)   . CHF (congestive heart failure) (HCC)   . Elevated troponin level not due myocardial infarction 06/13/2015   cath w/ nl cors, felt 2nd HTN, CHF, peat trop 2.73  . Essential hypertension   . Gastroesophageal reflux disease   . History of cardiac catheterization    Normal coronaries December 2016  . Noncompliance    Major problem leading to declining health and recurrent hospitalization  . Nonischemic cardiomyopathy (HCC)    a. LVEF 20-25% in 2017 with cath in 2016 showing normal cors. b. EF 55% by echo in 03/2018.  . On home O2    3L N/C  . OSA (obstructive sleep apnea)   . Osteoarthritis   . Peptic ulcer disease     Patient Active Problem List   Diagnosis Date Noted  . Cellulitis of right leg   . Acute diastolic CHF (congestive heart failure) (HCC) 12/15/2018  . Cellulitis 12/08/2018  . Chronic obstructive pulmonary  disease (HCC)   . Benign prostatic hyperplasia without lower urinary tract symptoms   . Acute on chronic combined systolic and diastolic CHF (congestive heart failure) (HCC) 10/02/2018  . AKI (acute kidney injury) (HCC) 09/18/2018  . Acute on chronic combined systolic and diastolic congestive heart failure (HCC) 08/15/2018  . SOB (shortness of breath) 08/03/2018  . Volume overload 07/22/2018  . Acute systolic CHF (congestive heart failure) (HCC) 07/04/2018  . Acute on chronic combined systolic and diastolic heart failure (HCC) 04/01/2018  . Hypomagnesemia 04/01/2018  . Acute on chronic diastolic CHF (congestive heart failure) (HCC) 03/11/2018  . Pressure injury of skin 03/11/2018  . CHF (congestive heart failure) (HCC) 09/08/2017  . Altered mental status 05/11/2017  . Acute exacerbation of CHF (congestive heart failure) (HCC) 04/20/2017  . Acute on chronic systolic (congestive) heart failure (HCC) 04/19/2017  . CHF exacerbation (HCC) 04/11/2017  . Chronic respiratory failure with hypoxia (HCC) 04/11/2017  . Encounter for hospice care discussion   . Palliative care encounter   . Goals of care, counseling/discussion   . DNR (do not resuscitate)   . DNR (do not resuscitate) discussion   . Chronic congestive heart failure (HCC) 02/18/2017  . Acute systolic heart failure (HCC) 01/18/2017  . Pedal edema 12/02/2016  . Acute CHF (congestive heart failure) (HCC) 11/09/2016  . Acute on chronic systolic CHF (congestive heart  failure) ([place]) 08/19/2016  . Chronic renal failure, stage 2 (mild) 05/17/2016  . Coronary arteries, normal Dec 2016 01/23/2016  . Chronic anticoagulation-Xarelto 01/23/2016  . NSVT (nonsustained ventricular tachycardia) ([place]) 01/23/2016  . Elevated troponin 12/21/2015  . Nonischemic cardiomyopathy ([place])   . Obesity, Class III, BMI 40-49.9 (morbid obesity) ([place])   . Noncompliance with diet and medication regimen 12/02/2015  . OSA (obstructive sleep apnea) 09/20/2015  .  Pulmonary hypertension ([place]) 09/19/2015  . Normocytic anemia 09/19/2015  . Atrial fibrillation, chronic   . Dyspnea on exertion 05/28/2015  . Acute respiratory failure with hypoxia ([place]) 04/01/2015  . Hypokalemia 04/01/2015  . Obesity hypoventilation syndrome ([place]) 08/08/2014  . GERD (gastroesophageal reflux disease) 08/08/2014  . Edema of right lower extremity 06/21/2014  . Fecal urgency 06/21/2014  . Asthma 02/11/2009  . Hyperlipidemia 07/12/2008  . OBESITY, MORBID 07/12/2008  . Anxiety state 07/12/2008  . DEPRESSION 07/12/2008  . Essential hypertension 07/12/2008  . ALLERGIC RHINITIS 07/12/2008  . Peptic ulcer 07/12/2008  . OSTEOARTHRITIS 07/12/2008    Past Surgical History:  Procedure Laterality Date  . CARDIAC CATHETERIZATION N/A 06/14/2015   Procedure: Right/Left Heart Cath and Coronary Angiography;  Surgeon: Lorretta Harp, MD;  Location: [place] CV LAB;  Service: Cardiovascular;  Laterality: N/A;        Home Medications    Prior to Admission medications   Medication Sig Start Date End Date Taking? Authorizing Provider  acetaminophen (TYLENOL) 325 MG tablet Take 2 tablets (650 mg total) by mouth every 4 (four) hours as needed for headache or mild pain. 11/02/18  Yes Debbe Odea, MD  albuterol (PROVENTIL) (2.5 MG/3ML) 0.083% nebulizer solution Take 2.5 mg by nebulization every 6 (six) hours as needed for wheezing or shortness of breath.   Yes [provider]  atorvastatin (LIPITOR) 40 MG tablet Take 40 mg by mouth daily.  09/05/18  Yes [provider]  carvedilol (COREG) 25 MG tablet Take 1 tablet (25 mg total) by mouth 2 (two) times daily with a meal for 30 days. 08/18/18 11/04/19 Yes Shah, Pratik D, DO  docusate sodium (COLACE) 100 MG capsule Take 1 capsule (100 mg total) by mouth 2 (two) times daily. Patient taking differently: Take 100 mg by mouth every morning.  06/25/16  Yes Hosie Poisson, MD  ferrous gluconate (FERGON) 324 MG tablet Take 1  tablet (324 mg total) by mouth 2 (two) times daily with a meal. Patient taking differently: Take 324 mg by mouth daily.  06/25/16  Yes Hosie Poisson, MD  fluticasone (FLOVENT HFA) 220 MCG/ACT inhaler Inhale 2 puffs into the lungs daily as needed (for shortness of breath).    Yes [provider]  furosemide (LASIX) 80 MG tablet Take 1.5 tablets (120 mg total) by mouth 2 (two) times daily. 12/18/18  Yes Barton Dubois, MD  spironolactone (ALDACTONE) 100 MG tablet Take 0.5 tablets (50 mg total) by mouth daily. For fluid 12/18/18  Yes Barton Dubois, MD  lisinopril (ZESTRIL) 10 MG tablet Take 10 mg by mouth daily. 11/22/18   [provider]  oxyCODONE-acetaminophen (PERCOCET/ROXICET) 5-325 MG tablet Take 1 tablet by mouth every 6 (six) hours as needed. 12/30/18   Milton Ferguson, MD  pantoprazole (PROTONIX) 40 MG tablet Take 1 tablet (40 mg total) by mouth daily at 6 (six) AM. 06/26/16   Hosie Poisson, MD  potassium chloride SA (K-DUR) 20 MEQ tablet Take 2 tablets (40 mEq total) by mouth daily. 12/18/18   Barton Dubois, MD  rivaroxaban Alveda Reasons) 20  MG TABS tablet Take 1 tablet (20 mg total) by mouth daily with breakfast. Patient taking differently: Take 20 mg by mouth daily with supper.  06/26/16   Kathlen ModyAkula, Vijaya, MD  silver sulfADIAZINE (SILVADENE) 1 % cream Apply 1 application topically daily. 11/28/18   Maxwell CaulLayden, Lindsey A, PA-C  tamsulosin (FLOMAX) 0.4 MG CAPS capsule Take 1 capsule (0.4 mg total) by mouth daily. 06/25/16   Kathlen ModyAkula, Vijaya, MD  potassium chloride 20 MEQ TBCR Take 20 mEq by mouth 2 (two) times daily. 09/03/15 11/04/18  Ward, Layla MawKristen N, DO    Family History Family History  Problem Relation Age of Onset  . Stroke Father   . Heart attack Father   . Aneurysm Mother        Cerebral aneurysm  . Hypertension Sister   . Colon cancer Neg Hx   . Inflammatory bowel disease Neg Hx   . Liver disease Neg Hx     Social History Social History   Tobacco Use  . Smoking status: Former Smoker     Packs/day: 0.50    Years: 20.00    Pack years: 10.00    Types: Cigarettes    Start date: 04/26/1988    Quit date: 06/23/2007    Years since quitting: 11.5  . Smokeless tobacco: Never Used  . Tobacco comment: 1 ppd former smoker  Substance Use Topics  . Alcohol use: Not Currently    Alcohol/week: 0.0 standard drinks    Comment: No etoh since 2009  . Drug use: No     Allergies   Banana, Bee venom, Strawberry flavor, Aspirin, Metolazone, Oatmeal, Orange juice [orange oil], Torsemide, Diltiazem, Hydralazine, and Lipitor [atorvastatin]   Review of Systems Review of Systems  Constitutional: Negative for appetite change and fatigue.  HENT: Negative for congestion, ear discharge and sinus pressure.   Eyes: Negative for discharge.  Respiratory: Negative for cough.   Cardiovascular: Negative for chest pain.  Gastrointestinal: Negative for abdominal pain and diarrhea.  Genitourinary: Negative for frequency and hematuria.  Musculoskeletal: Negative for back pain.       Leg swelling  Skin: Negative for rash.  Neurological: Negative for seizures and headaches.       Sleepy  Psychiatric/Behavioral: Negative for hallucinations.     Physical Exam Updated Vital Signs BP (!) 140/91   Pulse 68   Temp 98.2 F (36.8 C)   Resp 20   Ht 6\' 2"  (1.88 m)   Wt 77.1 kg   SpO2 100%   BMI 21.83 kg/m   Physical Exam Vitals signs and nursing note reviewed.  Constitutional:      Appearance: He is well-developed.  HENT:     Head: Normocephalic.     Comments: Pulse pinpoint    Nose: Nose normal.  Eyes:     General: No scleral icterus.    Conjunctiva/sclera: Conjunctivae normal.  Neck:     Musculoskeletal: Neck supple.     Thyroid: No thyromegaly.  Cardiovascular:     Rate and Rhythm: Normal rate and regular rhythm.     Heart sounds: No murmur. No friction rub. No gallop.   Pulmonary:     Breath sounds: No stridor. No wheezing or rales.  Chest:     Chest wall: No tenderness.   Abdominal:     General: There is no distension.     Tenderness: There is no abdominal tenderness. There is no rebound.  Musculoskeletal: Normal range of motion.     Comments: 3+ edema in both legs  Lymphadenopathy:     Cervical: No cervical adenopathy.  Skin:    Findings: No erythema or rash.  Neurological:     Mental Status: He is oriented to person, place, and time.     Motor: No abnormal muscle tone.     Coordination: Coordination normal.     Comments: Lethargic but oriented  Psychiatric:        Behavior: Behavior normal.      ED Treatments / Results  Labs (all labs ordered are listed, but only abnormal results are displayed) Labs Reviewed  I-STAT CHEM 8, ED - Abnormal; Notable for the following components:      Result Value   Calcium, Ion 1.14 (*)    Hemoglobin 12.9 (*)    HCT 38.0 (*)    All other components within normal limits    EKG None  Radiology No results found.  Procedures Procedures (including critical care time)  Medications Ordered in ED Medications  naloxone [facility](NARCAN) injection 1 mg ( Intravenous Not Given 12/30/18 2231)  naloxone [facility](NARCAN) injection 1 mg (1 mg Intravenous Given 12/30/18 2211)     Initial Impression / Assessment and Plan / ED Course  I have reviewed the triage vital signs and the nursing notes.  Pertinent labs & imaging results that were available during my care of the patient were reviewed by me and considered in my medical decision making (see chart for details).        Patient was given some Narcan and this helped some.  Suspect lethargy is from Percocets and possibly Lasix he took.  We will get a CT scan of his head and monitor him for a little while Final Clinical Impressions(s) / ED Diagnoses   Final diagnoses:  None    ED Discharge Orders    None       Bethann BerkshireZammit, Jayelle Page, MD 12/31/18 1149

## 2018-12-30 NOTE — ED Notes (Signed)
Patient transported to CT 

## 2018-12-30 NOTE — ED Triage Notes (Signed)
Patient came in by [facility]. Chief complaint shortness of breath. Patient is disoriented and hard to arouse/posible effect of medication. Patient states he is sleepy. Narcan 1 mg ordered by Dr. Roderic Palau

## 2018-12-30 NOTE — Discharge Instructions (Addendum)
Keep taking your medicines and follow-up with your doctor next week

## 2018-12-30 NOTE — ED Notes (Signed)
Pt calling family for ride home.

## 2018-12-30 NOTE — ED Notes (Signed)
Pt refusing to wear oxygen when being rolled out to lobby-

## 2018-12-30 NOTE — ED Provider Notes (Signed)
Patient was left at change of shift to check the results of his head CT and to wait for patient to become more awake.  He had been seen earlier in the evening and given Percocet for pain and returned somnolent.  Ct Head Wo Contrast  Result Date: 12/30/2018 CLINICAL DATA:  Altered level consciousness EXAM: CT HEAD WITHOUT CONTRAST TECHNIQUE: Contiguous axial images were obtained from the base of the skull through the vertex without intravenous contrast. COMPARISON:  05/11/2017 FINDINGS: Brain: No evidence of acute infarction, hemorrhage, hydrocephalus, extra-axial collection or mass lesion/mass effect. Vascular: No hyperdense vessel or unexpected calcification. Skull: Normal. Negative for fracture or focal lesion. Sinuses/Orbits: Mucosal retention cyst is noted in the maxillary antra bilaterally. Orbits and their contents are within normal limits. Other: None IMPRESSION: No acute abnormality noted. Electronically Signed   By: Inez Catalina M.D.   On: 12/30/2018 23:42    12:05 AM when I enter the room patient sitting on the side of the bed.  He states he had a hard time getting into his house when he left the ED before because he was so sleepy.  He states he feels like his breathing is little better.  He has had urinary output of over 800 cc.  He states he is feeling better.  His speech is still a little bit slurred and he still seems little mentally slow.  I will observe him a little bit longer.  We discussed being very careful taking pain medications with his underlying heart and lung disease.  12:55 Pt now laying in bed, still has some slurred speech. States he feels light headed, nurse reports he is still wobbly when he walked to the bathroom.  Will observe longer   Recheck at 2 AM patient states he started to feel better.  He states he still a little bit shaky and has to hold on to a chair if he stands up to urinate.  We will observe him a little bit longer.  Recheck at 5 AM patient has been  sleeping, he states he still needs a chair to steady himself when he gets up.  7 AM patient states he feels ready to be discharged.    Rolland Porter, MD 12/31/18 [id number]

## 2018-12-30 NOTE — ED Notes (Signed)
Pt e-signed but signature did not take

## 2018-12-30 NOTE — Patient Outreach (Addendum)
[facility]) Care Management  12/30/2018  [name] [birth year] [id number]  Patient was called to follow up on his edema. HIPAA identifiers were obtained.   Patient said his medications are not working. He reported his weight as 380 pounds today.  He said he is not eating much or drinking much because he has so much fluid.   Patient seemed very frustrated.  He continued to say his medications are not working and the only medications that work for him are the ones that he receives at the hospital.    We talked about trying to get "real medications" for him (Brand name) but he was very upset and said he would just keep going to the hospital.  Patient said he saw his PCP earlier this week and told him that he was fluid overloaded and was told "we are not going to focus on that today".    He said everyone (including me) keeps focusing on what he is eating and not the real issue--him not being given "real medications"/medications that actually work.  Patient's PCP was contacted a few weeks ago about writing his prescriptions "dispense as written" for the brand name but they did not respond.    Patient's chart was reviewed before signing the note and he went to the ED earlier today.  His chief complaint was right leg pain and edema. Patient was given a prescription for Oxycodone/APAP and was discharged home.  Plan: Call patient back next week.   Elayne Guerin, PharmD, [place] Clinical Pharmacist [phone]

## 2018-12-30 NOTE — ED Provider Notes (Signed)
[facility] EMERGENCY DEPARTMENT Provider Note   CSN: [id number] Arrival date & time: 12/30/18  1941     History   Chief Complaint Chief Complaint  Patient presents with  . Leg Swelling    HPI [name] is a 53 y.o. male.     Patient has a history of severe congestive heart failure with significant edema in his legs.  He is on 2 L of oxygen at home and his O2 sats are 100% here.  Patient complains of pain in his right leg.  Patient has been taking Percocets but ran out of them.  The history is provided by the patient. No language interpreter was used.  Leg Pain Lower extremity pain location: Right leg. Injury: no   Pain details:    Quality:  Aching   Radiates to:  Does not radiate   Severity:  Moderate   Onset quality:  Gradual   Timing:  Constant   Progression:  Worsening Chronicity:  Recurrent Dislocation: no   Associated symptoms: no back pain and no fatigue     Past Medical History:  Diagnosis Date  . Allergic rhinitis   . Asthma   . Atrial fibrillation ([place])   . CHF (congestive heart failure) ([place])   . Elevated troponin level not due myocardial infarction 06/13/2015   cath w/ nl cors, felt 2nd HTN, CHF, peat trop 2.73  . Essential hypertension   . Gastroesophageal reflux disease   . History of cardiac catheterization    Normal coronaries December 2016  . Noncompliance    Major problem leading to declining health and recurrent hospitalization  . Nonischemic cardiomyopathy ([place])    a. LVEF 20-25% in 2017 with cath in 2016 showing normal cors. b. EF 55% by echo in 03/2018.  . On home O2    3L N/C  . OSA (obstructive sleep apnea)   . Osteoarthritis   . Peptic ulcer disease     Patient Active Problem List   Diagnosis Date Noted  . Cellulitis of right leg   . Acute diastolic CHF (congestive heart failure) ([place]) 12/15/2018  . Cellulitis 12/08/2018  . Chronic obstructive pulmonary disease ([place])   . Benign prostatic hyperplasia without lower urinary  tract symptoms   . Acute on chronic combined systolic and diastolic CHF (congestive heart failure) ([place]) 10/02/2018  . AKI (acute kidney injury) ([place]) 09/18/2018  . Acute on chronic combined systolic and diastolic congestive heart failure ([place]) 08/15/2018  . SOB (shortness of breath) 08/03/2018  . Volume overload 07/22/2018  . Acute systolic CHF (congestive heart failure) ([place]) 07/04/2018  . Acute on chronic combined systolic and diastolic heart failure ([place]) 04/01/2018  . Hypomagnesemia 04/01/2018  . Acute on chronic diastolic CHF (congestive heart failure) ([place]) 03/11/2018  . Pressure injury of skin 03/11/2018  . CHF (congestive heart failure) ([place]) 09/08/2017  . Altered mental status 05/11/2017  . Acute exacerbation of CHF (congestive heart failure) ([place]) 04/20/2017  . Acute on chronic systolic (congestive) heart failure ([place]) 04/19/2017  . CHF exacerbation ([place]) 04/11/2017  . Chronic respiratory failure with hypoxia ([place]) 04/11/2017  . Encounter for hospice care discussion   . Palliative care encounter   . Goals of care, counseling/discussion   . DNR (do not resuscitate)   . DNR (do not resuscitate) discussion   . Chronic congestive heart failure ([place]) 02/18/2017  . Acute systolic heart failure ([place]) 01/18/2017  . Pedal edema 12/02/2016  . Acute CHF (congestive heart failure) ([place]) 11/09/2016  . Acute on  chronic systolic CHF (congestive heart failure) (H[facility]) 08/19/2016  . Chronic renal failure, stage 2 (mild) 05/17/2016  . Coronary arteries, normal Dec 2016 01/23/2016  . Chronic anticoagulation-Xarelto 01/23/2016  . NSVT (nonsustained ventricular tachycardia) (H[facility]) 01/23/2016  . Elevated troponin 12/21/2015  . Nonischemic cardiomyopathy ([facility])   . Obesity, Class III, BMI 40-49.9 (morbid obesity) ([facility])   . Noncompliance with diet and medication regimen 12/02/2015  . OSA (obstructive sleep apnea) 09/20/2015  . Pulmonary hypertension (H[facility]) 09/19/2015  . Normocytic anemia  09/19/2015  . Atrial fibrillation, chronic   . Dyspnea on exertion 05/28/2015  . Acute respiratory failure with hypoxia (H[facility]) 04/01/2015  . Hypokalemia 04/01/2015  . Obesity hypoventilation syndrome (H[facility]) 08/08/2014  . GERD (gastroesophageal reflux disease) 08/08/2014  . Edema of right lower extremity 06/21/2014  . Fecal urgency 06/21/2014  . Asthma 02/11/2009  . Hyperlipidemia 07/12/2008  . OBESITY, MORBID 07/12/2008  . Anxiety state 07/12/2008  . DEPRESSION 07/12/2008  . Essential hypertension 07/12/2008  . ALLERGIC RHINITIS 07/12/2008  . Peptic ulcer 07/12/2008  . OSTEOARTHRITIS 07/12/2008    Past Surgical History:  Procedure Laterality Date  . CARDIAC CATHETERIZATION N/A 06/14/2015   Procedure: Right/Left Heart Cath and Coronary Angiography;  Surgeon: Runell GessJonathan J Berry, MD;  [facility];  Service: Cardiovascular;  Laterality: N/A;        Home Medications    Prior to Admission medications   Medication Sig Start Date End Date Taking? Authorizing Provider  acetaminophen (TYLENOL) 325 MG tablet Take 2 tablets (650 mg total) by mouth every 4 (four) hours as needed for headache or mild pain. 11/02/18   Calvert Cantorizwan, Saima, MD  albuterol (PROVENTIL) (2.5 MG/3ML) 0.083% nebulizer solution Take 2.5 mg by nebulization every 6 (six) hours as needed for wheezing or shortness of breath.    [provider]  atorvastatin (LIPITOR) 40 MG tablet Take 40 mg by mouth daily.  09/05/18   [provider]  carvedilol (COREG) 25 MG tablet Take 1 tablet (25 mg total) by mouth 2 (two) times daily with a meal for 30 days. 08/18/18 11/04/19  Sherryll BurgerShah, Pratik D, DO  docusate sodium (COLACE) 100 MG capsule Take 1 capsule (100 mg total) by mouth 2 (two) times daily. Patient taking differently: Take 100 mg by mouth every morning.  06/25/16   Kathlen ModyAkula, Vijaya, MD  ferrous gluconate (FERGON) 324 MG tablet Take 1 tablet (324 mg total) by mouth 2 (two) times daily with a meal. Patient taking  differently: Take 324 mg by mouth daily.  06/25/16   Kathlen ModyAkula, Vijaya, MD  fluticasone (FLOVENT HFA) 220 MCG/ACT inhaler Inhale 2 puffs into the lungs daily as needed (for shortness of breath).     [provider]  furosemide (LASIX) 80 MG tablet Take 1.5 tablets (120 mg total) by mouth 2 (two) times daily. 12/18/18   Vassie LollMadera, Carlos, MD  lisinopril (ZESTRIL) 10 MG tablet Take 10 mg by mouth daily. 11/22/18   [provider]  oxyCODONE-acetaminophen (PERCOCET/ROXICET) 5-325 MG tablet Take 1 tablet by mouth every 6 (six) hours as needed. 12/30/18   Bethann BerkshireZammit, Charl Wellen, MD  pantoprazole (PROTONIX) 40 MG tablet Take 1 tablet (40 mg total) by mouth daily at 6 (six) AM. 06/26/16   Kathlen ModyAkula, Vijaya, MD  potassium chloride SA (K-DUR) 20 MEQ tablet Take 2 tablets (40 mEq total) by mouth daily. 12/18/18   Vassie LollMadera, Carlos, MD  rivaroxaban (XARELTO) 20 MG TABS tablet Take 1 tablet (20 mg total) by mouth daily with breakfast. Patient taking differently: Take  20 mg by mouth daily with supper.  06/26/16   Kathlen ModyAkula, Vijaya, MD  silver sulfADIAZINE (SILVADENE) 1 % cream Apply 1 application topically daily. 11/28/18   Maxwell CaulLayden, Lindsey A, PA-C  spironolactone (ALDACTONE) 100 MG tablet Take 0.5 tablets (50 mg total) by mouth daily. For fluid 12/18/18   Vassie LollMadera, Carlos, MD  tamsulosin (FLOMAX) 0.4 MG CAPS capsule Take 1 capsule (0.4 mg total) by mouth daily. 06/25/16   Kathlen ModyAkula, Vijaya, MD  potassium chloride 20 MEQ TBCR Take 20 mEq by mouth 2 (two) times daily. 09/03/15 11/04/18  Ward, Layla MawKristen N, DO    Family History Family History  Problem Relation Age of Onset  . Stroke Father   . Heart attack Father   . Aneurysm Mother        Cerebral aneurysm  . Hypertension Sister   . Colon cancer Neg Hx   . Inflammatory bowel disease Neg Hx   . Liver disease Neg Hx     Social History Social History   Tobacco Use  . Smoking status: Former Smoker    Packs/day: 0.50    Years: 20.00    Pack years: 10.00    Types: Cigarettes    Start  date: 04/26/1988    Quit date: 06/23/2007    Years since quitting: 11.5  . Smokeless tobacco: Never Used  . Tobacco comment: 1 ppd former smoker  Substance Use Topics  . Alcohol use: Not Currently    Alcohol/week: 0.0 standard drinks    Comment: No etoh since 2009  . Drug use: No     Allergies   Banana, Bee venom, Strawberry flavor, Aspirin, Metolazone, Oatmeal, Orange juice [orange oil], Torsemide, Diltiazem, Hydralazine, and Lipitor [atorvastatin]   Review of Systems Review of Systems  Constitutional: Negative for appetite change and fatigue.  HENT: Negative for congestion, ear discharge and sinus pressure.   Eyes: Negative for discharge.  Respiratory: Negative for cough.   Cardiovascular: Negative for chest pain.  Gastrointestinal: Negative for abdominal pain and diarrhea.  Genitourinary: Negative for frequency and hematuria.  Musculoskeletal: Negative for back pain.       Leg pain  Skin: Negative for rash.  Neurological: Negative for seizures and headaches.  Psychiatric/Behavioral: Negative for hallucinations.     Physical Exam Updated Vital Signs BP (!) 179/86 (BP Location: Left Arm)   Pulse 93   Temp 98.5 F (36.9 C) (Oral)   Ht 6' (1.829 m)   Wt (!) 170.1 kg   SpO2 97%   BMI 50.86 kg/m   Physical Exam Vitals signs and nursing note reviewed.  Constitutional:      Appearance: He is well-developed.  HENT:     Head: Normocephalic.     Nose: Nose normal.  Eyes:     General: No scleral icterus.    Conjunctiva/sclera: Conjunctivae normal.  Neck:     Musculoskeletal: Neck supple.     Thyroid: No thyromegaly.  Cardiovascular:     Rate and Rhythm: Normal rate and regular rhythm.     Heart sounds: No murmur. No friction rub. No gallop.   Pulmonary:     Breath sounds: No stridor. No wheezing or rales.  Chest:     Chest wall: No tenderness.  Abdominal:     General: There is no distension.     Tenderness: There is no abdominal tenderness. There is no rebound.   Musculoskeletal: Normal range of motion.     Comments: 3+ edema in legs.  Lymphadenopathy:     Cervical: No cervical  adenopathy.  Skin:    Findings: No erythema or rash.  Neurological:     Mental Status: He is oriented to person, place, and time.     Motor: No abnormal muscle tone.     Coordination: Coordination normal.  Psychiatric:        Behavior: Behavior normal.      ED Treatments / Results  Labs (all labs ordered are listed, but only abnormal results are displayed) Labs Reviewed - No data to display  EKG None  Radiology No results found.  Procedures Procedures (including critical care time)  Medications Ordered in ED Medications  oxyCODONE-acetaminophen (PERCOCET/ROXICET) 5-325 MG per tablet 2 tablet (has no administration in time range)     Initial Impression / Assessment and Plan / ED Course  I have reviewed the triage vital signs and the nursing notes.  Pertinent labs & imaging results that were available during my care of the patient were reviewed by me and considered in my medical decision making (see chart for details).        Patient with chronic edema in his legs from congestive heart failure.  He is at his normal when he comes to his breathing and his blood pressure and his edema.  I will give him some Percocet and send him home  Final Clinical Impressions(s) / ED Diagnoses   Final diagnoses:  Peripheral edema    ED Discharge Orders         Ordered    oxyCODONE-acetaminophen (PERCOCET/ROXICET) 5-325 MG tablet  Every 6 hours PRN     12/30/18 1952           Bethann Berkshire, MD 12/30/18 1954

## 2018-12-30 NOTE — Patient Outreach (Signed)
[facility]) Care Management  12/30/2018  [name] 01/30/1966 [id number]   Attempted outreach with [name]. Phone rang several times without option to leave a voice message.  Per chart review, [facility] Pharmacist, Denyse Amass, was able to reach him earlier today and discuss his medications.   PLAN -Will attempt outreach with [name] next week.   [place] [phone]

## 2018-12-30 NOTE — ED Triage Notes (Signed)
Pt in by [facility] for leg pain, chronic, worse over the past couple of days.  Pt c/o pain.

## 2018-12-31 NOTE — Discharge Instructions (Addendum)
You should be very careful if you take that pain medication that was prescribed to you earlier.  You should never take 2 at one time.

## 2019-01-02 ENCOUNTER — Emergency Department (HOSPITAL_COMMUNITY): Payer: Medicare Other

## 2019-01-02 ENCOUNTER — Emergency Department (HOSPITAL_COMMUNITY)
Admission: EM | Admit: 2019-01-02 | Discharge: 2019-01-02 | Disposition: A | Discharge status: Home / Self Care | Payer: Medicare Other | Attending: Emergency Medicine | Admitting: Emergency Medicine

## 2019-01-02 ENCOUNTER — Other Ambulatory Visit: Payer: Self-pay

## 2019-01-02 ENCOUNTER — Encounter (HOSPITAL_COMMUNITY): Payer: Self-pay | Admitting: *Deleted

## 2019-01-02 DIAGNOSIS — N182 Chronic kidney disease, stage 2 (mild): Secondary | ICD-10-CM | POA: Diagnosis not present

## 2019-01-02 DIAGNOSIS — I482 Chronic atrial fibrillation, unspecified: Secondary | ICD-10-CM | POA: Diagnosis not present

## 2019-01-02 DIAGNOSIS — R2243 Localized swelling, mass and lump, lower limb, bilateral: Secondary | ICD-10-CM | POA: Diagnosis not present

## 2019-01-02 DIAGNOSIS — Z87891 Personal history of nicotine dependence: Secondary | ICD-10-CM | POA: Insufficient documentation

## 2019-01-02 DIAGNOSIS — I130 Hypertensive heart and chronic kidney disease with heart failure and stage 1 through stage 4 chronic kidney disease, or unspecified chronic kidney disease: Secondary | ICD-10-CM | POA: Insufficient documentation

## 2019-01-02 DIAGNOSIS — I5042 Chronic combined systolic (congestive) and diastolic (congestive) heart failure: Secondary | ICD-10-CM | POA: Diagnosis not present

## 2019-01-02 DIAGNOSIS — R0602 Shortness of breath: Secondary | ICD-10-CM | POA: Diagnosis not present

## 2019-01-02 DIAGNOSIS — J449 Chronic obstructive pulmonary disease, unspecified: Secondary | ICD-10-CM | POA: Diagnosis not present

## 2019-01-02 DIAGNOSIS — Z7901 Long term (current) use of anticoagulants: Secondary | ICD-10-CM | POA: Diagnosis not present

## 2019-01-02 DIAGNOSIS — M7989 Other specified soft tissue disorders: Secondary | ICD-10-CM

## 2019-01-02 DIAGNOSIS — Z79899 Other long term (current) drug therapy: Secondary | ICD-10-CM | POA: Diagnosis not present

## 2019-01-02 DIAGNOSIS — I10 Essential (primary) hypertension: Secondary | ICD-10-CM | POA: Diagnosis not present

## 2019-01-02 DIAGNOSIS — I4891 Unspecified atrial fibrillation: Secondary | ICD-10-CM | POA: Diagnosis not present

## 2019-01-02 DIAGNOSIS — R0789 Other chest pain: Secondary | ICD-10-CM | POA: Diagnosis not present

## 2019-01-02 LAB — CBC WITH DIFFERENTIAL/PLATELET
Abs Immature Granulocytes: 0.01 10*3/uL (ref 0.00–0.07)
Basophils Absolute: 0 10*3/uL (ref 0.0–0.1)
Basophils Relative: 0 %
Eosinophils Absolute: 0.1 10*3/uL (ref 0.0–0.5)
Eosinophils Relative: 3 %
HCT: 40.8 % (ref 39.0–52.0)
Hemoglobin: 13.1 g/dL (ref 13.0–17.0)
Immature Granulocytes: 0 %
Lymphocytes Relative: 23 %
Lymphs Abs: 1.1 10*3/uL (ref 0.7–4.0)
MCH: 27.9 pg (ref 26.0–34.0)
MCHC: 32.1 g/dL (ref 30.0–36.0)
MCV: 86.8 fL (ref 80.0–100.0)
Monocytes Absolute: 0.5 10*3/uL (ref 0.1–1.0)
Monocytes Relative: 11 %
Neutro Abs: 3.1 10*3/uL (ref 1.7–7.7)
Neutrophils Relative %: 63 %
Platelets: 173 10*3/uL (ref 150–400)
RBC: 4.7 MIL/uL (ref 4.22–5.81)
RDW: 14.4 % (ref 11.5–15.5)
WBC: 4.9 10*3/uL (ref 4.0–10.5)
nRBC: 0 % (ref 0.0–0.2)

## 2019-01-02 LAB — COMPREHENSIVE METABOLIC PANEL
ALT: 15 U/L (ref 0–44)
AST: 19 U/L (ref 15–41)
Albumin: 3.6 g/dL (ref 3.5–5.0)
Alkaline Phosphatase: 61 U/L (ref 38–126)
Anion gap: 11 (ref 5–15)
BUN: 15 mg/dL (ref 6–20)
CO2: 25 mmol/L (ref 22–32)
Calcium: 8.8 mg/dL — ABNORMAL LOW (ref 8.9–10.3)
Chloride: 105 mmol/L (ref 98–111)
Creatinine, Ser: 1.03 mg/dL (ref 0.61–1.24)
GFR calc Af Amer: >60 mL/min (ref >60)
GFR calc non Af Amer: >60 mL/min (ref >60)
Glucose, Bld: 97 mg/dL (ref 70–99)
Potassium: 3.4 mmol/L — ABNORMAL LOW (ref 3.5–5.1)
Sodium: 141 mmol/L (ref 135–145)
Total Bilirubin: 0.9 mg/dL (ref 0.3–1.2)
Total Protein: 7.1 g/dL (ref 6.5–8.1)

## 2019-01-02 LAB — BRAIN NATRIURETIC PEPTIDE: B Natriuretic Peptide: 182 pg/mL — ABNORMAL HIGH (ref 0.0–100.0)

## 2019-01-02 LAB — CBG MONITORING, ED: Glucose-Capillary: 95 mg/dL (ref 70–99)

## 2019-01-02 LAB — TROPONIN I (HIGH SENSITIVITY)
Troponin I (High Sensitivity): 20 ng/L — ABNORMAL HIGH (ref <18)
Troponin I (High Sensitivity): 21 ng/L — ABNORMAL HIGH (ref <18)

## 2019-01-02 MED ORDER — FUROSEMIDE 40 MG PO TABS
120.0000 mg | ORAL_TABLET | Freq: Once | ORAL | Status: AC
  Administered 2019-01-02: 120 mg via ORAL
  Filled 2019-01-02: qty 3

## 2019-01-02 MED ORDER — DOXYCYCLINE HYCLATE 100 MG PO CAPS: 100.0000 mg | ORAL_CAPSULE | Freq: Two times a day (BID) | ORAL | 0 refills | Status: DC

## 2019-01-02 MED ORDER — SILVER SULFADIAZINE 1 % EX CREA: 1.0000 "application " | TOPICAL_CREAM | Freq: Every day | CUTANEOUS | 0 refills | Status: DC

## 2019-01-02 MED ORDER — ACETAMINOPHEN 500 MG PO TABS
1000.0000 mg | ORAL_TABLET | Freq: Once | ORAL | Status: AC
  Administered 2019-01-02: 1000 mg via ORAL
  Filled 2019-01-02: qty 2

## 2019-01-02 NOTE — ED Notes (Signed)
Attempted to ambulate pt, unable to do so due to patient appearing to be unsteady at this time. Pt speech is slurred as well. Pt denies taking anything at home PTA except for ASA. MD notified and in to re-evaluate pt at this time.

## 2019-01-02 NOTE — ED Provider Notes (Signed)
[facility] EMERGENCY DEPARTMENT Provider Note   CSN: [id number] Arrival date & time: 01/02/19  0423     History   Chief Complaint Chief Complaint  Patient presents with  . Leg Swelling    HPI [name] is a 53 y.o. male.      is a 53 y.o. male with medical history significant for chronic combined systolic and diastolic CHF with recurrent readmissions due to dietary medication noncompliance, atrial fibrillation on Xarelto, hypertension, MI, asthma, OSA on CPAP, and morbid obesity presents via EMS with increased pain and swelling to his legs, right greater than left.  States he is not had his blood pressure medication since Friday when he was here in the ED x2.  Reports he is having increased pain in his leg tonight despite taking Percocet he had at home.  Also has clear drainage from his leg progressing over the past several days.  He was prescribed doxycycline for cellulitis when he was in the hospital on June 28 and this should be done but he states he is still has some left.  He denies any fevers, chills, nausea or vomiting. Shortness of breath is at baseline.  He states he did take his Lasix yesterday.  He has had a few fleeting episodes of central chest pain lasting for a few seconds at a time but none currently.  Chart review shows patient return to the ED somnolent after receiving Percocet.  The history is provided by the patient and the EMS personnel.    Past Medical History:  Diagnosis Date  . Allergic rhinitis   . Asthma   . Atrial fibrillation (HCC)   . CHF (congestive heart failure) (HCC)   . Elevated troponin level not due myocardial infarction 06/13/2015   cath w/ nl cors, felt 2nd HTN, CHF, peat trop 2.73  . Essential hypertension   . Gastroesophageal reflux disease   . History of cardiac catheterization    Normal coronaries December 2016  . Noncompliance    Major problem leading to declining health and recurrent hospitalization  . Nonischemic  cardiomyopathy (HCC)    a. LVEF 20-25% in 2017 with cath in 2016 showing normal cors. b. EF 55% by echo in 03/2018.  . On home O2    3L N/C  . OSA (obstructive sleep apnea)   . Osteoarthritis   . Peptic ulcer disease     Patient Active Problem List   Diagnosis Date Noted  . Cellulitis of right leg   . Acute diastolic CHF (congestive heart failure) (HCC) 12/15/2018  . Cellulitis 12/08/2018  . Chronic obstructive pulmonary disease (HCC)   . Benign prostatic hyperplasia without lower urinary tract symptoms   . Acute on chronic combined systolic and diastolic CHF (congestive heart failure) (HCC) 10/02/2018  . AKI (acute kidney injury) (HCC) 09/18/2018  . Acute on chronic combined systolic and diastolic congestive heart failure (HCC) 08/15/2018  . SOB (shortness of breath) 08/03/2018  . Volume overload 07/22/2018  . Acute systolic CHF (congestive heart failure) (HCC) 07/04/2018  . Acute on chronic combined systolic and diastolic heart failure (HCC) 04/01/2018  . Hypomagnesemia 04/01/2018  . Acute on chronic diastolic CHF (congestive heart failure) (HCC) 03/11/2018  . Pressure injury of skin 03/11/2018  . CHF (congestive heart failure) (HCC) 09/08/2017  . Altered mental status 05/11/2017  . Acute exacerbation of CHF (congestive heart failure) (HCC) 04/20/2017  . Acute on chronic systolic (congestive) heart failure (HCC) 04/19/2017  . CHF exacerbation (HCC) 04/11/2017  . Chronic  respiratory failure with hypoxia (HCC) 04/11/2017  . Encounter for hospice care discussion   . Palliative care encounter   . Goals of care, counseling/discussion   . DNR (do not resuscitate)   . DNR (do not resuscitate) discussion   . Chronic congestive heart failure (HCC) 02/18/2017  . Acute systolic heart failure (HCC) 01/18/2017  . Pedal edema 12/02/2016  . Acute CHF (congestive heart failure) (HCC) 11/09/2016  . Acute on chronic systolic CHF (congestive heart failure) (HCC) 08/19/2016  . Chronic renal  failure, stage 2 (mild) 05/17/2016  . Coronary arteries, normal Dec 2016 01/23/2016  . Chronic anticoagulation-Xarelto 01/23/2016  . NSVT (nonsustained ventricular tachycardia) (HCC) 01/23/2016  . Elevated troponin 12/21/2015  . Nonischemic cardiomyopathy (HCC)   . Obesity, Class III, BMI 40-49.9 (morbid obesity) (HCC)   . Noncompliance with diet and medication regimen 12/02/2015  . OSA (obstructive sleep apnea) 09/20/2015  . Pulmonary hypertension (HCC) 09/19/2015  . Normocytic anemia 09/19/2015  . Atrial fibrillation, chronic   . Dyspnea on exertion 05/28/2015  . Acute respiratory failure with hypoxia (HCC) 04/01/2015  . Hypokalemia 04/01/2015  . Obesity hypoventilation syndrome (HCC) 08/08/2014  . GERD (gastroesophageal reflux disease) 08/08/2014  . Edema of right lower extremity 06/21/2014  . Fecal urgency 06/21/2014  . Asthma 02/11/2009  . Hyperlipidemia 07/12/2008  . OBESITY, MORBID 07/12/2008  . Anxiety state 07/12/2008  . DEPRESSION 07/12/2008  . Essential hypertension 07/12/2008  . ALLERGIC RHINITIS 07/12/2008  . Peptic ulcer 07/12/2008  . OSTEOARTHRITIS 07/12/2008    Past Surgical History:  Procedure Laterality Date  . CARDIAC CATHETERIZATION N/A 06/14/2015   Procedure: Right/Left Heart Cath and Coronary Angiography;  Surgeon: Runell Gess, MD;  Location: [facility] INVASIVE CV LAB;  Service: Cardiovascular;  Laterality: N/A;        Home Medications    Prior to Admission medications   Medication Sig Start Date End Date Taking? Authorizing Provider  acetaminophen (TYLENOL) 325 MG tablet Take 2 tablets (650 mg total) by mouth every 4 (four) hours as needed for headache or mild pain. 11/02/18   Calvert Cantor, MD  albuterol (PROVENTIL) (2.5 MG/3ML) 0.083% nebulizer solution Take 2.5 mg by nebulization every 6 (six) hours as needed for wheezing or shortness of breath.    [provider]  atorvastatin (LIPITOR) 40 MG tablet Take 40 mg by mouth daily.  09/05/18    [provider]  carvedilol (COREG) 25 MG tablet Take 1 tablet (25 mg total) by mouth 2 (two) times daily with a meal for 30 days. 08/18/18 11/04/19  Sherryll Burger, Pratik D, DO  docusate sodium (COLACE) 100 MG capsule Take 1 capsule (100 mg total) by mouth 2 (two) times daily. Patient taking differently: Take 100 mg by mouth every morning.  06/25/16   Kathlen Mody, MD  ferrous gluconate (FERGON) 324 MG tablet Take 1 tablet (324 mg total) by mouth 2 (two) times daily with a meal. Patient taking differently: Take 324 mg by mouth daily.  06/25/16   Kathlen Mody, MD  fluticasone (FLOVENT HFA) 220 MCG/ACT inhaler Inhale 2 puffs into the lungs daily as needed (for shortness of breath).     [provider]  furosemide (LASIX) 80 MG tablet Take 1.5 tablets (120 mg total) by mouth 2 (two) times daily. 12/18/18   Vassie Loll, MD  lisinopril (ZESTRIL) 10 MG tablet Take 10 mg by mouth daily. 11/22/18   [provider]  oxyCODONE-acetaminophen (PERCOCET/ROXICET) 5-325 MG tablet Take 1 tablet by mouth every 6 (six) hours as needed.  12/30/18   Bethann BerkshireZammit, Joseph, MD  pantoprazole (PROTONIX) 40 MG tablet Take 1 tablet (40 mg total) by mouth daily at 6 (six) AM. 06/26/16   Kathlen ModyAkula, Vijaya, MD  potassium chloride SA (K-DUR) 20 MEQ tablet Take 2 tablets (40 mEq total) by mouth daily. 12/18/18   Vassie LollMadera, Carlos, MD  rivaroxaban (XARELTO) 20 MG TABS tablet Take 1 tablet (20 mg total) by mouth daily with breakfast. Patient taking differently: Take 20 mg by mouth daily with supper.  06/26/16   Kathlen ModyAkula, Vijaya, MD  silver sulfADIAZINE (SILVADENE) 1 % cream Apply 1 application topically daily. 11/28/18   Maxwell CaulLayden, Lindsey A, PA-C  spironolactone (ALDACTONE) 100 MG tablet Take 0.5 tablets (50 mg total) by mouth daily. For fluid 12/18/18   Vassie LollMadera, Carlos, MD  tamsulosin (FLOMAX) 0.4 MG CAPS capsule Take 1 capsule (0.4 mg total) by mouth daily. 06/25/16   Kathlen ModyAkula, Vijaya, MD  potassium chloride 20 MEQ TBCR Take 20 mEq by mouth 2 (two)  times daily. 09/03/15 11/04/18  Ward, Layla MawKristen N, DO    Family History Family History  Problem Relation Age of Onset  . Stroke Father   . Heart attack Father   . Aneurysm Mother        Cerebral aneurysm  . Hypertension Sister   . Colon cancer Neg Hx   . Inflammatory bowel disease Neg Hx   . Liver disease Neg Hx     Social History Social History   Tobacco Use  . Smoking status: Former Smoker    Packs/day: 0.50    Years: 20.00    Pack years: 10.00    Types: Cigarettes    Start date: 04/26/1988    Quit date: 06/23/2007    Years since quitting: 11.5  . Smokeless tobacco: Never Used  . Tobacco comment: 1 ppd former smoker  Substance Use Topics  . Alcohol use: Not Currently    Alcohol/week: 0.0 standard drinks    Comment: No etoh since 2009  . Drug use: No     Allergies   Banana, Bee venom, Strawberry flavor, Aspirin, Metolazone, Oatmeal, Orange juice [orange oil], Torsemide, Diltiazem, Hydralazine, and Lipitor [atorvastatin]   Review of Systems Review of Systems  Constitutional: Positive for fatigue. Negative for activity change and appetite change.  HENT: Negative for congestion and rhinorrhea.   Respiratory: Positive for chest tightness and shortness of breath.   Cardiovascular: Positive for chest pain and leg swelling.  Gastrointestinal: Negative for abdominal pain, nausea and vomiting.  Genitourinary: Negative for dysuria and hematuria.  Musculoskeletal: Negative for arthralgias and myalgias.  Skin: Positive for wound.  Neurological: Negative for dizziness, weakness and headaches.     all other systems are negative except as noted in the HPI and PMH.   Physical Exam Updated Vital Signs BP (!) 140/101 (BP Location: Left Arm)   Pulse 96   Temp 98.6 F (37 C) (Oral)   Resp 20   Ht 6\' 2"  (1.88 m)   Wt (!) 170.1 kg   SpO2 97%   BMI 48.15 kg/m   Physical Exam Vitals signs and nursing note reviewed.  Constitutional:      General: He is not in acute distress.     Appearance: He is well-developed. He is obese. He is ill-appearing.     Comments: Chronically ill-appearing  No respiratory distress, speaking full sentences  HENT:     Head: Normocephalic and atraumatic.     Mouth/Throat:     Pharynx: No oropharyngeal exudate.  Eyes:  Conjunctiva/sclera: Conjunctivae normal.     Pupils: Pupils are equal, round, and reactive to light.  Neck:     Musculoskeletal: Normal range of motion and neck supple.     Comments: No meningismus. Cardiovascular:     Rate and Rhythm: Normal rate and regular rhythm.     Heart sounds: Normal heart sounds. No murmur.  Pulmonary:     Effort: Pulmonary effort is normal. No respiratory distress.     Breath sounds: Rales present.     Comments: Diminished breath sounds, bibasilar crackles Abdominal:     Palpations: Abdomen is soft.     Tenderness: There is no abdominal tenderness. There is no guarding or rebound.  Musculoskeletal: Normal range of motion.        General: Swelling present. No tenderness.     Right lower leg: Edema present.     Left lower leg: Edema present.     Comments: Brawny edema lower legs bilaterally, regular to the left.  Intact DP and PT pulses bilaterally.  Hyperkeratotic changes overlying the right lower leg and calf with areas of induration.  No discrete areas of erythema, fluctuance or abscess. There is clear drainage with areas of skin breakdown on anterior shin.  Skin:    General: Skin is warm.     Capillary Refill: Capillary refill takes less than 2 seconds.  Neurological:     General: No focal deficit present.     Mental Status: He is alert and oriented to person, place, and time. Mental status is at baseline.     Cranial Nerves: No cranial nerve deficit.     Motor: No abnormal muscle tone.     Coordination: Coordination normal.     Comments: No ataxia on finger to nose bilaterally. No pronator drift. 5/5 strength throughout. CN 2-12 intact.Equal grip strength. Sensation intact.    Psychiatric:        Behavior: Behavior normal.      ED Treatments / Results  Labs (all labs ordered are listed, but only abnormal results are displayed) Labs Reviewed  COMPREHENSIVE METABOLIC PANEL - Abnormal; Notable for the following components:      Result Value   Potassium 3.4 (*)    Calcium 8.8 (*)    All other components within normal limits  BRAIN NATRIURETIC PEPTIDE - Abnormal; Notable for the following components:   B Natriuretic Peptide 182.0 (*)    All other components within normal limits  TROPONIN I (HIGH SENSITIVITY) - Abnormal; Notable for the following components:   Troponin I (High Sensitivity) 20.00 (*)    All other components within normal limits  CBC WITH DIFFERENTIAL/PLATELET  CBG MONITORING, ED  TROPONIN I (HIGH SENSITIVITY)    EKG EKG Interpretation  Date/Time:  Monday January 02 2019 04:47:56 EDT Ventricular Rate:  104 PR Interval:    QRS Duration: 96 QT Interval:  371 QTC Calculation: 488 R Axis:   70 Text Interpretation:  Atrial fibrillation Multiple ventricular premature complexes Nonspecific T abnormalities, lateral leads Borderline prolonged QT interval No significant change was found Confirmed by Glynn Octaveancour, Jeyla Bulger [phone]([id number]) on 01/02/2019 4:52:24 AM   Radiology Dg Chest Portable 1 View  Result Date: 01/02/2019 CLINICAL DATA:  Shortness of breath and leg swelling EXAM: PORTABLE CHEST 1 VIEW COMPARISON:  12/14/2018 FINDINGS: Cardiomegaly with vascular pedicle widening and cephalized blood flow. No visible Kerley lines but limited by soft tissue attenuation. No effusion or pneumothorax IMPRESSION: Cardiomegaly and vascular congestion. Electronically Signed   By: Kathrynn DuckingJonathon  Watts M.D.  On: 01/02/2019 05:25    Procedures Procedures (including critical care time)  Medications Ordered in ED Medications - No data to display   Initial Impression / Assessment and Plan / ED Course  I have reviewed the triage vital signs and the nursing notes.   Pertinent labs & imaging results that were available during my care of the patient were reviewed by me and considered in my medical decision making (see chart for details).       Patient well-known to the ED presenting with acute on chronic leg swelling.  History of poor compliance with medications. States he is breathing well today.  He appears to have no shortness of breath or dyspnea which is an improvement from his usual presentation.  Patient known to be noncompliant with medications.  He should have completed antibiotics by now.  There is no erythema on exam or fever to suggest cellulitis.  Patient did have CT imaging of his leg last month which showed no deep space infection. DVT considered but thought to be less likely given patient's use of Xarelto though he admits to missing doses occasionally including yesterday.  Work-up is reassuring.  Patient with no increased work of breathing and no hypoxia.  Chest x-ray shows stable vascular congestion. Creatinine is at baseline.  Patient given p.o. Lasix.  Will arrange for outpatient Doppler ultrasound to assess for DVT as patient has been noncompliant with his Xarelto.  It is not available at this time of the morning.  Patient will be prescribed Silvadene cream for his draining lymphedema as well as given doxycycline for possible superficial infection.  On attempted ambulation, patient suddenly having somnolence with slurred speech and difficulty walking. He was speaking normally several minutes before. No facial droop, no weakness in arms or legs.  Denies taking any additional pain medication other than aspirin before he came to the hospital.  CBG was checked and 95.  CT head from 3 days ago was negative.  Patient slurred speech rapidly improved and he appears back to baseline. No focal neuro deficits. he is able to ambulate and is calling for a ride.  Patient states this has happened to him before when he takes pain medication at home.   Mental status appears to be at baseline he is able to ambulate without assistance. He knows to followup for his DVT study later today.  Stressed compliance with medications and diet. He states he has all of his medications at home.  Return precautions discussed.  Final Clinical Impressions(s) / ED Diagnoses   Final diagnoses:  Leg swelling    ED Discharge Orders    None       Masayo Fera, Annie Main, MD 01/02/19 [phone]

## 2019-01-02 NOTE — Discharge Instructions (Signed)
Take your medications as prescribed and follow a low-salt diet.  Do not miss doses of your diuretic or blood thinner. Follow-up later today for an ultrasound of your leg to rule out blood clot.  Use the cream as prescribed and take the antibiotics.  Return to the ED with chest pain, shortness of breath or other concerns.

## 2019-01-02 NOTE — ED Triage Notes (Signed)
Pt c/o leg swelling, elevated blood pressure, reports that he has not taken his blood pressure since Friday, was seen in er on Friday twice for same results.

## 2019-01-02 NOTE — ED Notes (Signed)
Pt says that he has taken his abx for the leg infection but is still having pain in RIGHT leg. Requesting pain medication.

## 2019-01-03 ENCOUNTER — Emergency Department (HOSPITAL_COMMUNITY): Payer: Medicare Other

## 2019-01-03 ENCOUNTER — Encounter (HOSPITAL_COMMUNITY): Payer: Self-pay

## 2019-01-03 ENCOUNTER — Emergency Department (HOSPITAL_COMMUNITY)
Admission: EM | Admit: 2019-01-03 | Discharge: 2019-01-03 | Disposition: A | Discharge status: Home / Self Care | Payer: Medicare Other | Attending: Emergency Medicine | Admitting: Emergency Medicine

## 2019-01-03 DIAGNOSIS — N182 Chronic kidney disease, stage 2 (mild): Secondary | ICD-10-CM | POA: Insufficient documentation

## 2019-01-03 DIAGNOSIS — I130 Hypertensive heart and chronic kidney disease with heart failure and stage 1 through stage 4 chronic kidney disease, or unspecified chronic kidney disease: Secondary | ICD-10-CM | POA: Diagnosis not present

## 2019-01-03 DIAGNOSIS — Z6841 Body Mass Index (BMI) 40.0 and over, adult: Secondary | ICD-10-CM | POA: Diagnosis not present

## 2019-01-03 DIAGNOSIS — Z79899 Other long term (current) drug therapy: Secondary | ICD-10-CM | POA: Insufficient documentation

## 2019-01-03 DIAGNOSIS — J45909 Unspecified asthma, uncomplicated: Secondary | ICD-10-CM | POA: Diagnosis not present

## 2019-01-03 DIAGNOSIS — I5023 Acute on chronic systolic (congestive) heart failure: Secondary | ICD-10-CM | POA: Insufficient documentation

## 2019-01-03 DIAGNOSIS — J449 Chronic obstructive pulmonary disease, unspecified: Secondary | ICD-10-CM | POA: Diagnosis not present

## 2019-01-03 DIAGNOSIS — Z87891 Personal history of nicotine dependence: Secondary | ICD-10-CM | POA: Diagnosis not present

## 2019-01-03 DIAGNOSIS — I110 Hypertensive heart disease with heart failure: Secondary | ICD-10-CM | POA: Diagnosis not present

## 2019-01-03 DIAGNOSIS — R0602 Shortness of breath: Secondary | ICD-10-CM

## 2019-01-03 DIAGNOSIS — Z7901 Long term (current) use of anticoagulants: Secondary | ICD-10-CM | POA: Diagnosis not present

## 2019-01-03 DIAGNOSIS — I491 Atrial premature depolarization: Secondary | ICD-10-CM | POA: Diagnosis not present

## 2019-01-03 DIAGNOSIS — E669 Obesity, unspecified: Secondary | ICD-10-CM | POA: Insufficient documentation

## 2019-01-03 DIAGNOSIS — R0689 Other abnormalities of breathing: Secondary | ICD-10-CM | POA: Diagnosis not present

## 2019-01-03 DIAGNOSIS — I959 Hypotension, unspecified: Secondary | ICD-10-CM | POA: Diagnosis not present

## 2019-01-03 LAB — CBC WITH DIFFERENTIAL/PLATELET
Abs Immature Granulocytes: 0.01 10*3/uL (ref 0.00–0.07)
Basophils Absolute: 0 10*3/uL (ref 0.0–0.1)
Basophils Relative: 0 %
Eosinophils Absolute: 0.1 10*3/uL (ref 0.0–0.5)
Eosinophils Relative: 2 %
HCT: 41.7 % (ref 39.0–52.0)
Hemoglobin: 13.1 g/dL (ref 13.0–17.0)
Immature Granulocytes: 0 %
Lymphocytes Relative: 16 %
Lymphs Abs: 0.6 10*3/uL — ABNORMAL LOW (ref 0.7–4.0)
MCH: 27.8 pg (ref 26.0–34.0)
MCHC: 31.4 g/dL (ref 30.0–36.0)
MCV: 88.3 fL (ref 80.0–100.0)
Monocytes Absolute: 0.4 10*3/uL (ref 0.1–1.0)
Monocytes Relative: 11 %
Neutro Abs: 2.8 10*3/uL (ref 1.7–7.7)
Neutrophils Relative %: 71 %
Platelets: 183 10*3/uL (ref 150–400)
RBC: 4.72 MIL/uL (ref 4.22–5.81)
RDW: 14.5 % (ref 11.5–15.5)
WBC: 3.9 10*3/uL — ABNORMAL LOW (ref 4.0–10.5)
nRBC: 0 % (ref 0.0–0.2)

## 2019-01-03 LAB — BASIC METABOLIC PANEL
Anion gap: 9 (ref 5–15)
BUN: 14 mg/dL (ref 6–20)
CO2: 28 mmol/L (ref 22–32)
Calcium: 8.5 mg/dL — ABNORMAL LOW (ref 8.9–10.3)
Chloride: 104 mmol/L (ref 98–111)
Creatinine, Ser: 1.03 mg/dL (ref 0.61–1.24)
GFR calc Af Amer: >60 mL/min (ref >60)
GFR calc non Af Amer: >60 mL/min (ref >60)
Glucose, Bld: 104 mg/dL — ABNORMAL HIGH (ref 70–99)
Potassium: 3.3 mmol/L — ABNORMAL LOW (ref 3.5–5.1)
Sodium: 141 mmol/L (ref 135–145)

## 2019-01-03 LAB — BRAIN NATRIURETIC PEPTIDE: B Natriuretic Peptide: 120 pg/mL — ABNORMAL HIGH (ref 0.0–100.0)

## 2019-01-03 MED ORDER — POTASSIUM CHLORIDE CRYS ER 20 MEQ PO TBCR
40.0000 meq | EXTENDED_RELEASE_TABLET | Freq: Once | ORAL | Status: AC
  Administered 2019-01-03: 40 meq via ORAL
  Filled 2019-01-03: qty 2

## 2019-01-03 MED ORDER — FUROSEMIDE 10 MG/ML IJ SOLN
80.0000 mg | Freq: Once | INTRAMUSCULAR | Status: AC
  Administered 2019-01-03: 80 mg via INTRAMUSCULAR
  Filled 2019-01-03: qty 8

## 2019-01-03 NOTE — ED Notes (Signed)
While ambulating patient [name] was 96% however patient reported dizziness and stated he felt tired.

## 2019-01-03 NOTE — ED Triage Notes (Addendum)
Pt c/o SOB. Pt seen here multiple times over the weekend. VSS with ems. Pt ambulated to stretcher. 18 g R ac. By ems.

## 2019-01-03 NOTE — Discharge Instructions (Addendum)
You were seen today for shortness of breath.  This is likely related to your heart failure.  It is very important that you continue your Lasix at home as directed.  Follow-up with Dr. Maudie Mercury and your cardiologist for adjustments in your medications.  You also need to continue your potassium supplementation.

## 2019-01-03 NOTE — ED Provider Notes (Addendum)
[facility] EMERGENCY DEPARTMENT Provider Note   CSN: [id number] Arrival date & time: 01/03/19  0217    History   Chief Complaint Chief Complaint  Patient presents with  . Shortness of Breath    HPI [name] is a 53 y.o. male.     HPI  This is a 53 year old male with a history of atrial fibrillation, chronic heart failure, hypertension who presents with shortness of breath.  Patient reports worsening shortness of breath over the last 2 to 3 days.  He was seen yesterday for leg swelling.  He is currently on antibiotics for possible infection.  He did not get his ultrasound that was scheduled for today.  Patient states that he has had worsening shortness of breath.  No cough or fevers.  Shortness of breath is worse with laying flat.  He denies chest pain.  Patient with history of medical noncompliance.  He reports that he has been taking his Lasix at home.  Past Medical History:  Diagnosis Date  . Allergic rhinitis   . Asthma   . Atrial fibrillation ([place])   . CHF (congestive heart failure) ([place])   . Elevated troponin level not due myocardial infarction 06/13/2015   cath w/ nl cors, felt 2nd HTN, CHF, peat trop 2.73  . Essential hypertension   . Gastroesophageal reflux disease   . History of cardiac catheterization    Normal coronaries December 2016  . Noncompliance    Major problem leading to declining health and recurrent hospitalization  . Nonischemic cardiomyopathy ([place])    a. LVEF 20-25% in 2017 with cath in 2016 showing normal cors. b. EF 55% by echo in 03/2018.  . On home O2    3L N/C  . OSA (obstructive sleep apnea)   . Osteoarthritis   . Peptic ulcer disease     Patient Active Problem List   Diagnosis Date Noted  . Cellulitis of right leg   . Acute diastolic CHF (congestive heart failure) ([place]) 12/15/2018  . Cellulitis 12/08/2018  . Chronic obstructive pulmonary disease ([place])   . Benign prostatic hyperplasia without lower urinary tract symptoms   .  Acute on chronic combined systolic and diastolic CHF (congestive heart failure) ([place]) 10/02/2018  . AKI (acute kidney injury) ([place]) 09/18/2018  . Acute on chronic combined systolic and diastolic congestive heart failure ([place]) 08/15/2018  . SOB (shortness of breath) 08/03/2018  . Volume overload 07/22/2018  . Acute systolic CHF (congestive heart failure) ([place]) 07/04/2018  . Acute on chronic combined systolic and diastolic heart failure ([place]) 04/01/2018  . Hypomagnesemia 04/01/2018  . Acute on chronic diastolic CHF (congestive heart failure) ([place]) 03/11/2018  . Pressure injury of skin 03/11/2018  . CHF (congestive heart failure) ([place]) 09/08/2017  . Altered mental status 05/11/2017  . Acute exacerbation of CHF (congestive heart failure) ([place]) 04/20/2017  . Acute on chronic systolic (congestive) heart failure ([place]) 04/19/2017  . CHF exacerbation ([place]) 04/11/2017  . Chronic respiratory failure with hypoxia ([place]) 04/11/2017  . Encounter for hospice care discussion   . Palliative care encounter   . Goals of care, counseling/discussion   . DNR (do not resuscitate)   . DNR (do not resuscitate) discussion   . Chronic congestive heart failure ([place]) 02/18/2017  . Acute systolic heart failure ([place]) 01/18/2017  . Pedal edema 12/02/2016  . Acute CHF (congestive heart failure) ([place]) 11/09/2016  . Acute on chronic systolic CHF (congestive heart failure) ([place]) 08/19/2016  . Chronic renal failure, stage 2 (mild) 05/17/2016  .  Coronary arteries, normal Dec 2016 01/23/2016  . Chronic anticoagulation-Xarelto 01/23/2016  . NSVT (nonsustained ventricular tachycardia) (HCC) 01/23/2016  . Elevated troponin 12/21/2015  . Nonischemic cardiomyopathy (HCC)   . Obesity, Class III, BMI 40-49.9 (morbid obesity) (HCC)   . Noncompliance with diet and medication regimen 12/02/2015  . OSA (obstructive sleep apnea) 09/20/2015  . Pulmonary hypertension (HCC) 09/19/2015  . Normocytic anemia 09/19/2015  . Atrial  fibrillation, chronic   . Dyspnea on exertion 05/28/2015  . Acute respiratory failure with hypoxia (HCC) 04/01/2015  . Hypokalemia 04/01/2015  . Obesity hypoventilation syndrome (HCC) 08/08/2014  . GERD (gastroesophageal reflux disease) 08/08/2014  . Edema of right lower extremity 06/21/2014  . Fecal urgency 06/21/2014  . Asthma 02/11/2009  . Hyperlipidemia 07/12/2008  . OBESITY, MORBID 07/12/2008  . Anxiety state 07/12/2008  . DEPRESSION 07/12/2008  . Essential hypertension 07/12/2008  . ALLERGIC RHINITIS 07/12/2008  . Peptic ulcer 07/12/2008  . OSTEOARTHRITIS 07/12/2008    Past Surgical History:  Procedure Laterality Date  . CARDIAC CATHETERIZATION N/A 06/14/2015   Procedure: Right/Left Heart Cath and Coronary Angiography;  Surgeon: Runell GessJonathan J Berry, MD;  [facility];  Service: Cardiovascular;  Laterality: N/A;        Home Medications    Prior to Admission medications   Medication Sig Start Date End Date Taking? Authorizing Provider  acetaminophen (TYLENOL) 325 MG tablet Take 2 tablets (650 mg total) by mouth every 4 (four) hours as needed for headache or mild pain. 11/02/18   Calvert Cantorizwan, Saima, MD  albuterol (PROVENTIL) (2.5 MG/3ML) 0.083% nebulizer solution Take 2.5 mg by nebulization every 6 (six) hours as needed for wheezing or shortness of breath.    [provider]  atorvastatin (LIPITOR) 40 MG tablet Take 40 mg by mouth daily.  09/05/18   [provider]  carvedilol (COREG) 25 MG tablet Take 1 tablet (25 mg total) by mouth 2 (two) times daily with a meal for 30 days. 08/18/18 11/04/19  Sherryll BurgerShah, Pratik D, DO  docusate sodium (COLACE) 100 MG capsule Take 1 capsule (100 mg total) by mouth 2 (two) times daily. Patient taking differently: Take 100 mg by mouth every morning.  06/25/16   Kathlen ModyAkula, Vijaya, MD  doxycycline (VIBRAMYCIN) 100 MG capsule Take 1 capsule (100 mg total) by mouth 2 (two) times daily. 01/02/19   Rancour, Jeannett SeniorStephen, MD  ferrous gluconate  (FERGON) 324 MG tablet Take 1 tablet (324 mg total) by mouth 2 (two) times daily with a meal. Patient taking differently: Take 324 mg by mouth daily.  06/25/16   Kathlen ModyAkula, Vijaya, MD  fluticasone (FLOVENT HFA) 220 MCG/ACT inhaler Inhale 2 puffs into the lungs daily as needed (for shortness of breath).     [provider]  furosemide (LASIX) 80 MG tablet Take 1.5 tablets (120 mg total) by mouth 2 (two) times daily. 12/18/18   Vassie LollMadera, Carlos, MD  lisinopril (ZESTRIL) 10 MG tablet Take 10 mg by mouth daily. 11/22/18   [provider]  oxyCODONE-acetaminophen (PERCOCET/ROXICET) 5-325 MG tablet Take 1 tablet by mouth every 6 (six) hours as needed. 12/30/18   Bethann BerkshireZammit, Joseph, MD  pantoprazole (PROTONIX) 40 MG tablet Take 1 tablet (40 mg total) by mouth daily at 6 (six) AM. 06/26/16   Kathlen ModyAkula, Vijaya, MD  potassium chloride SA (K-DUR) 20 MEQ tablet Take 2 tablets (40 mEq total) by mouth daily. 12/18/18   Vassie LollMadera, Carlos, MD  rivaroxaban (XARELTO) 20 MG TABS tablet Take 1 tablet (20 mg total) by mouth daily with  breakfast. Patient taking differently: Take 20 mg by mouth daily with supper.  06/26/16   Kathlen ModyAkula, Vijaya, MD  silver sulfADIAZINE (SILVADENE) 1 % cream Apply 1 application topically daily. 01/02/19   Rancour, Jeannett SeniorStephen, MD  spironolactone (ALDACTONE) 100 MG tablet Take 0.5 tablets (50 mg total) by mouth daily. For fluid 12/18/18   Vassie LollMadera, Carlos, MD  tamsulosin (FLOMAX) 0.4 MG CAPS capsule Take 1 capsule (0.4 mg total) by mouth daily. 06/25/16   Kathlen ModyAkula, Vijaya, MD  potassium chloride 20 MEQ TBCR Take 20 mEq by mouth 2 (two) times daily. 09/03/15 11/04/18  Ward, Layla MawKristen N, DO    Family History Family History  Problem Relation Age of Onset  . Stroke Father   . Heart attack Father   . Aneurysm Mother        Cerebral aneurysm  . Hypertension Sister   . Colon cancer Neg Hx   . Inflammatory bowel disease Neg Hx   . Liver disease Neg Hx     Social History Social History   Tobacco Use  . Smoking  status: Former Smoker    Packs/day: 0.50    Years: 20.00    Pack years: 10.00    Types: Cigarettes    Start date: 04/26/1988    Quit date: 06/23/2007    Years since quitting: 11.5  . Smokeless tobacco: Never Used  . Tobacco comment: 1 ppd former smoker  Substance Use Topics  . Alcohol use: Not Currently    Alcohol/week: 0.0 standard drinks    Comment: No etoh since 2009  . Drug use: No     Allergies   Banana, Bee venom, Strawberry flavor, Aspirin, Metolazone, Oatmeal, Orange juice [orange oil], Torsemide, Diltiazem, Hydralazine, and Lipitor [atorvastatin]   Review of Systems Review of Systems  Constitutional: Negative for fever.  Respiratory: Positive for shortness of breath. Negative for cough.   Cardiovascular: Positive for leg swelling. Negative for chest pain.  Gastrointestinal: Negative for abdominal pain, nausea and vomiting.  Skin:       Skin changes right lower extremity  Neurological: Positive for dizziness.  All other systems reviewed and are negative.    Physical Exam Updated Vital Signs BP (!) 136/106   Pulse 99   Temp 98.1 F (36.7 C) (Oral)   Resp 17   Wt (!) 170.1 kg   SpO2 96%   BMI 48.15 kg/m   Physical Exam Vitals signs and nursing note reviewed.  Constitutional:      Appearance: He is well-developed. He is obese. He is not ill-appearing or toxic-appearing.  HENT:     Head: Normocephalic and atraumatic.  Eyes:     Pupils: Pupils are equal, round, and reactive to light.  Neck:     Musculoskeletal: Neck supple.  Cardiovascular:     Rate and Rhythm: Normal rate. Rhythm irregular.     Heart sounds: Normal heart sounds. No murmur.  Pulmonary:     Effort: Pulmonary effort is normal. No respiratory distress.     Breath sounds: Normal breath sounds. No wheezing.     Comments: Exam is limited by body habitus, no significant tachypnea or respiratory distress, diminished breath sounds in all lung fields Abdominal:     General: Bowel sounds are  normal.     Palpations: Abdomen is soft.     Tenderness: There is no abdominal tenderness. There is no rebound.  Musculoskeletal:     Right lower leg: Edema present.     Left lower leg: Edema present.  Comments: Right greater than left lower extremity edema, chronic venous changes right lower extremity with weeping  Skin:    General: Skin is warm and dry.  Neurological:     Mental Status: He is alert and oriented to person, place, and time.  Psychiatric:        Mood and Affect: Mood normal.      ED Treatments / Results  Labs (all labs ordered are listed, but only abnormal results are displayed) Labs Reviewed  BASIC METABOLIC PANEL - Abnormal; Notable for the following components:      Result Value   Potassium 3.3 (*)    Glucose, Bld 104 (*)    Calcium 8.5 (*)    All other components within normal limits  CBC WITH DIFFERENTIAL/PLATELET - Abnormal; Notable for the following components:   WBC 3.9 (*)    Lymphs Abs 0.6 (*)    All other components within normal limits  BRAIN NATRIURETIC PEPTIDE - Abnormal; Notable for the following components:   B Natriuretic Peptide 120.0 (*)    All other components within normal limits    EKG EKG Interpretation  Date/Time:  Tuesday January 03 2019 03:02:43 EDT Ventricular Rate:  103 PR Interval:    QRS Duration: 104 QT Interval:  404 QTC Calculation: 478 R Axis:   42 Text Interpretation:  Atrial fibrillation Ventricular bigeminy Nonspecific T abnormalities, lateral leads Borderline prolonged QT interval Confirmed by Ross MarcusHorton, Lourine Alberico ([id number]) on 01/03/2019 3:07:47 AM   Radiology Dg Chest Portable 1 View  Result Date: 01/03/2019 CLINICAL DATA:  Shortness of breath EXAM: PORTABLE CHEST 1 VIEW COMPARISON:  01/02/2019, 12/14/2018, 12/08/2018 FINDINGS: Cardiomegaly with vascular congestion. No large effusion. Hazy opacity left base, likely attributable to overlying soft tissues. No pneumothorax. IMPRESSION: Limited by body habitus.   Cardiomegaly with vascular congestion. Electronically Signed   By: Jasmine PangKim  Fujinaga M.D.   On: 01/03/2019 03:02   Dg Chest Portable 1 View  Result Date: 01/02/2019 CLINICAL DATA:  Shortness of breath and leg swelling EXAM: PORTABLE CHEST 1 VIEW COMPARISON:  12/14/2018 FINDINGS: Cardiomegaly with vascular pedicle widening and cephalized blood flow. No visible Kerley lines but limited by soft tissue attenuation. No effusion or pneumothorax IMPRESSION: Cardiomegaly and vascular congestion. Electronically Signed   By: Marnee SpringJonathon  Watts M.D.   On: 01/02/2019 05:25    Procedures Procedures (including critical care time)  CRITICAL CARE Performed by: Shon Batonourtney F Tessah Patchen   Total critical care time: 40 minutes  Critical care time was exclusive of separately billable procedures and treating other patients.  Critical care was necessary to treat or prevent imminent or life-threatening deterioration.  Critical care was time spent personally by me on the following activities: development of treatment plan with patient and/or surrogate as well as nursing, discussions with consultants, evaluation of patient's response to treatment, examination of patient, obtaining history from patient or surrogate, ordering and performing treatments and interventions, ordering and review of laboratory studies, ordering and review of radiographic studies, pulse oximetry and re-evaluation of patient's condition.   Medications Ordered in ED Medications  furosemide (LASIX) injection 80 mg (80 mg Intramuscular Given 01/03/19 0401)  potassium chloride SA (K-DUR) CR tablet 40 mEq (40 mEq Oral Given 01/03/19 0419)     Initial Impression / Assessment and Plan / ED Course  I have reviewed the triage vital signs and the nursing notes.  Pertinent labs & imaging results that were available during my care of the patient were reviewed by me and considered in my medical decision making (  see chart for details).  Clinical Course as of Jan 02 545  Tue Jan 03, 2019  7062 Patient ambulated and was able to maintain pulse ox.  Reported some dizziness with ambulation.  He received 80 mg of IM Lasix.  He is requesting admission.  However, his chest x-ray appears to be at baseline.  Will reassess.   [CH]    Clinical Course User Index [CH] Luccas Towell, Mayer Masker, MD       Patient presents with shortness of breath.  History of heart failure and noncompliance.  Multiple admissions for diuresis.  He is overall nontoxic-appearing and in no respiratory distress.  Vital signs are reassuring.  His pulmonary exam is limited by body habitus.  He denies any infectious symptoms.  EKG shows no evidence of ischemia.  He is in persistent atrial fibrillation but is rate controlled.  He is not having any active chest pain.  Given reassuring EKG, will not pursue troponin.  Lab work including BMP and BNP is at the patient's baseline.  Patient is on anticoagulation for his atrial fibrillation and I would have low suspicion for DVT or PE.  He was given 80 mg of IM Lasix in addition to potassium supplementation.  On initial recheck he reports that he feels "off and dizzy."  However, he is able to ambulate and maintain his pulse ox.  I suspect he is very close to his baseline.  Patient was allowed to rest and on a second recheck, he has begun to diurese well.  He denies any significant shortness of breath at this time.  Will discharge and I have encouraged him to follow-up closely with his primary physician and cardiologist.  Continue his Lasix at home.  After history, exam, and medical workup I feel the patient has been appropriately medically screened and is safe for discharge home. Pertinent diagnoses were discussed with the patient. Patient was given return precautions.   Final Clinical Impressions(s) / ED Diagnoses   Final diagnoses:  Acute on chronic systolic heart failure (HCC)  SOB (shortness of breath)    ED Discharge Orders    None       Elleanor Guyett,  Mayer Masker, MD 01/03/19 3762    Shon Baton, MD 01/03/19 [phone]

## 2019-01-03 NOTE — ED Notes (Signed)
Patient was able to ambulate however patient stated he felt dizzy and tired. Patient also had take several breaks while ambulating due to dizziness.

## 2019-01-04 ENCOUNTER — Other Ambulatory Visit: Payer: Self-pay | Admitting: Pharmacist

## 2019-01-04 DIAGNOSIS — I5042 Chronic combined systolic (congestive) and diastolic (congestive) heart failure: Secondary | ICD-10-CM | POA: Diagnosis not present

## 2019-01-04 DIAGNOSIS — I480 Paroxysmal atrial fibrillation: Secondary | ICD-10-CM | POA: Diagnosis not present

## 2019-01-04 DIAGNOSIS — I890 Lymphedema, not elsewhere classified: Secondary | ICD-10-CM | POA: Diagnosis not present

## 2019-01-04 DIAGNOSIS — E876 Hypokalemia: Secondary | ICD-10-CM | POA: Diagnosis not present

## 2019-01-04 DIAGNOSIS — I10 Essential (primary) hypertension: Secondary | ICD-10-CM | POA: Diagnosis not present

## 2019-01-04 NOTE — Patient Outreach (Signed)
[facility]) Care Management  01/04/2019  [name] [birth year] [id number]   Patient was called for follow up. HIPAA identifiers were obtained.   Patient is a 53 year old male with multiple medical conditions including but not limited to:  morbid obesity, hypertension, chronic leg edema, chronic combined diastolic and systolic CHF, atrial fibrillation on anticoagulation with Xarelto, sleep apnea on CPAP with multiple hospitalizations for CHF exacerbation and lower extremity cellulitis.  Since my last conversation with the patient on 12/30/2018, he visited the ED four times:  01/03/2019- CHF-given IV furosemide and potassium then discharged.  01/02/2019-Leg Swelling- given Silvadene cream  12/30/2018 A-Peripheral Edema given oxycodone/APAP  12/30/2018-somnolence from Oxycodone-monitored then discharged.   Patient reported a random weight of 365 pounds during our conversation.  He reported taking all of his medications as prescribed but there was documentation on one of the ED notes from the weekend that patient is not compliant with Xarelto.  Patient also reported feeling better today and said that he felt like his medications were working for him (today).  Spoke with [place] Nurse, Donald Siva for consultation.  Plan: Call patient back in 2 weeks for follow up. Continue to provide support for medication adherence.   Elayne Guerin, PharmD, [place] Clinical Pharmacist [phone]

## 2019-01-05 ENCOUNTER — Other Ambulatory Visit: Payer: Self-pay

## 2019-01-06 NOTE — Patient Outreach (Signed)
[facility]) Care Management   [name] [birth year] [id number]   Routine outreach with [name]. He had several ED visits within the past week. He denied shortness of breath, chest discomfort or palpitations at the time of the call. He denied increased edema to his lower extremities. He did report drainage to his right leg and indicated that it is still larger than the left. Denied difficulty ambulating. Reported a weight of 372 lbs.  Strongly encouraged medication compliance. He reported taking antibiotics and pain medications but not taking the other medications as prescribed. He described oral furosemide as "fake Lasix" that makes his legs swell. He does not intend to take the prescribed dose.  Discussed nutrition. He was previously referred for free meal deliveries. This service provided healthier meal options. He cancelled the service. He does not follow diet recommendations.  Discussed community resources and available CHF programs. Also offered to resubmit a referral for him to receive IV diuretics in his home. Unfortunately, he declined the services again.  [name] was very clear about his intention to continue utilizing the [facility] ED and [facility] ED for non-emergent concerns. He denied any worsening or emergent symptoms during the call. Indicated that he plans to return to the ED in a few days.  PLAN -Will attempt outreach next week. -Will continue to encourage compliance with medications and treatment recommendations.  [place] [phone]

## 2019-01-09 ENCOUNTER — Emergency Department (HOSPITAL_COMMUNITY): Payer: Medicare Other

## 2019-01-09 ENCOUNTER — Inpatient Hospital Stay (HOSPITAL_COMMUNITY)
Admission: EM | Admit: 2019-01-09 | Discharge: 2019-01-11 | DRG: 291 | Disposition: A | Discharge status: Home / Self Care | Payer: Medicare Other | Attending: Internal Medicine | Admitting: Internal Medicine

## 2019-01-09 ENCOUNTER — Encounter (HOSPITAL_COMMUNITY): Payer: Self-pay

## 2019-01-09 ENCOUNTER — Other Ambulatory Visit: Payer: Self-pay

## 2019-01-09 DIAGNOSIS — I890 Lymphedema, not elsewhere classified: Secondary | ICD-10-CM | POA: Diagnosis not present

## 2019-01-09 DIAGNOSIS — E785 Hyperlipidemia, unspecified: Secondary | ICD-10-CM | POA: Diagnosis present

## 2019-01-09 DIAGNOSIS — J309 Allergic rhinitis, unspecified: Secondary | ICD-10-CM | POA: Diagnosis present

## 2019-01-09 DIAGNOSIS — Z91030 Bee allergy status: Secondary | ICD-10-CM | POA: Diagnosis not present

## 2019-01-09 DIAGNOSIS — Z8249 Family history of ischemic heart disease and other diseases of the circulatory system: Secondary | ICD-10-CM | POA: Diagnosis not present

## 2019-01-09 DIAGNOSIS — Z9111 Patient's noncompliance with dietary regimen: Secondary | ICD-10-CM

## 2019-01-09 DIAGNOSIS — Z7901 Long term (current) use of anticoagulants: Secondary | ICD-10-CM

## 2019-01-09 DIAGNOSIS — Z1159 Encounter for screening for other viral diseases: Secondary | ICD-10-CM | POA: Diagnosis not present

## 2019-01-09 DIAGNOSIS — J449 Chronic obstructive pulmonary disease, unspecified: Secondary | ICD-10-CM | POA: Diagnosis present

## 2019-01-09 DIAGNOSIS — I10 Essential (primary) hypertension: Secondary | ICD-10-CM | POA: Diagnosis not present

## 2019-01-09 DIAGNOSIS — I130 Hypertensive heart and chronic kidney disease with heart failure and stage 1 through stage 4 chronic kidney disease, or unspecified chronic kidney disease: Principal | ICD-10-CM | POA: Diagnosis present

## 2019-01-09 DIAGNOSIS — E662 Morbid (severe) obesity with alveolar hypoventilation: Secondary | ICD-10-CM

## 2019-01-09 DIAGNOSIS — E876 Hypokalemia: Secondary | ICD-10-CM | POA: Diagnosis present

## 2019-01-09 DIAGNOSIS — Z91018 Allergy to other foods: Secondary | ICD-10-CM

## 2019-01-09 DIAGNOSIS — N182 Chronic kidney disease, stage 2 (mild): Secondary | ICD-10-CM | POA: Diagnosis not present

## 2019-01-09 DIAGNOSIS — Z6841 Body Mass Index (BMI) 40.0 and over, adult: Secondary | ICD-10-CM | POA: Diagnosis not present

## 2019-01-09 DIAGNOSIS — Z79899 Other long term (current) drug therapy: Secondary | ICD-10-CM | POA: Diagnosis not present

## 2019-01-09 DIAGNOSIS — I509 Heart failure, unspecified: Secondary | ICD-10-CM | POA: Diagnosis not present

## 2019-01-09 DIAGNOSIS — I4821 Permanent atrial fibrillation: Secondary | ICD-10-CM | POA: Diagnosis present

## 2019-01-09 DIAGNOSIS — I472 Ventricular tachycardia: Secondary | ICD-10-CM | POA: Diagnosis not present

## 2019-01-09 DIAGNOSIS — J9611 Chronic respiratory failure with hypoxia: Secondary | ICD-10-CM | POA: Diagnosis present

## 2019-01-09 DIAGNOSIS — D649 Anemia, unspecified: Secondary | ICD-10-CM | POA: Diagnosis present

## 2019-01-09 DIAGNOSIS — Z888 Allergy status to other drugs, medicaments and biological substances status: Secondary | ICD-10-CM

## 2019-01-09 DIAGNOSIS — K219 Gastro-esophageal reflux disease without esophagitis: Secondary | ICD-10-CM | POA: Diagnosis not present

## 2019-01-09 DIAGNOSIS — I5043 Acute on chronic combined systolic (congestive) and diastolic (congestive) heart failure: Secondary | ICD-10-CM | POA: Diagnosis not present

## 2019-01-09 DIAGNOSIS — Z87891 Personal history of nicotine dependence: Secondary | ICD-10-CM

## 2019-01-09 DIAGNOSIS — R069 Unspecified abnormalities of breathing: Secondary | ICD-10-CM | POA: Diagnosis not present

## 2019-01-09 DIAGNOSIS — I110 Hypertensive heart disease with heart failure: Secondary | ICD-10-CM | POA: Diagnosis not present

## 2019-01-09 DIAGNOSIS — R0602 Shortness of breath: Secondary | ICD-10-CM | POA: Diagnosis not present

## 2019-01-09 DIAGNOSIS — Z91119 Patient's noncompliance with dietary regimen due to unspecified reason: Secondary | ICD-10-CM

## 2019-01-09 DIAGNOSIS — Z9119 Patient's noncompliance with other medical treatment and regimen: Secondary | ICD-10-CM

## 2019-01-09 DIAGNOSIS — Z9114 Patient's other noncompliance with medication regimen: Secondary | ICD-10-CM

## 2019-01-09 DIAGNOSIS — Z66 Do not resuscitate: Secondary | ICD-10-CM | POA: Diagnosis not present

## 2019-01-09 DIAGNOSIS — Z8711 Personal history of peptic ulcer disease: Secondary | ICD-10-CM | POA: Diagnosis not present

## 2019-01-09 DIAGNOSIS — I482 Chronic atrial fibrillation, unspecified: Secondary | ICD-10-CM | POA: Diagnosis not present

## 2019-01-09 DIAGNOSIS — I4891 Unspecified atrial fibrillation: Secondary | ICD-10-CM | POA: Diagnosis not present

## 2019-01-09 LAB — CBC
HCT: 37.3 % — ABNORMAL LOW (ref 39.0–52.0)
Hemoglobin: 11.7 g/dL — ABNORMAL LOW (ref 13.0–17.0)
MCH: 28 pg (ref 26.0–34.0)
MCHC: 31.4 g/dL (ref 30.0–36.0)
MCV: 89.2 fL (ref 80.0–100.0)
Platelets: 212 10*3/uL (ref 150–400)
RBC: 4.18 MIL/uL — ABNORMAL LOW (ref 4.22–5.81)
RDW: 14.4 % (ref 11.5–15.5)
WBC: 4.3 10*3/uL (ref 4.0–10.5)
nRBC: 0 % (ref 0.0–0.2)

## 2019-01-09 LAB — BASIC METABOLIC PANEL
Anion gap: 8 (ref 5–15)
BUN: 13 mg/dL (ref 6–20)
CO2: 25 mmol/L (ref 22–32)
Calcium: 8.3 mg/dL — ABNORMAL LOW (ref 8.9–10.3)
Chloride: 107 mmol/L (ref 98–111)
Creatinine, Ser: 0.98 mg/dL (ref 0.61–1.24)
GFR calc Af Amer: >60 mL/min (ref >60)
GFR calc non Af Amer: >60 mL/min (ref >60)
Glucose, Bld: 96 mg/dL (ref 70–99)
Potassium: 3.5 mmol/L (ref 3.5–5.1)
Sodium: 140 mmol/L (ref 135–145)

## 2019-01-09 LAB — TROPONIN I (HIGH SENSITIVITY)
Troponin I (High Sensitivity): 20 ng/L — ABNORMAL HIGH (ref <18)
Troponin I (High Sensitivity): 21 ng/L — ABNORMAL HIGH (ref <18)

## 2019-01-09 LAB — FOLATE: Folate: 13.2 ng/mL (ref >5.9)

## 2019-01-09 LAB — VITAMIN B12: Vitamin B-12: 242 pg/mL (ref 180–914)

## 2019-01-09 LAB — SARS CORONAVIRUS 2 BY RT PCR (HOSPITAL ORDER, PERFORMED IN ~~LOC~~ HOSPITAL LAB): SARS Coronavirus 2: NEGATIVE

## 2019-01-09 LAB — FERRITIN: Ferritin: 51 ng/mL (ref 24–336)

## 2019-01-09 LAB — POC OCCULT BLOOD, ED: Fecal Occult Bld: NEGATIVE

## 2019-01-09 LAB — IRON AND TIBC
Iron: 45 ug/dL (ref 45–182)
Saturation Ratios: 14 % — ABNORMAL LOW (ref 17.9–39.5)
TIBC: 316 ug/dL (ref 250–450)
UIBC: 271 ug/dL

## 2019-01-09 LAB — BRAIN NATRIURETIC PEPTIDE: B Natriuretic Peptide: 408 pg/mL — ABNORMAL HIGH (ref 0.0–100.0)

## 2019-01-09 MED ORDER — CARVEDILOL 12.5 MG PO TABS
25.0000 mg | ORAL_TABLET | Freq: Two times a day (BID) | ORAL | Status: DC
  Administered 2019-01-09 – 2019-01-11 (×4): 25 mg via ORAL
  Filled 2019-01-09 (×4): qty 2

## 2019-01-09 MED ORDER — FUROSEMIDE 10 MG/ML IJ SOLN
80.0000 mg | Freq: Once | INTRAMUSCULAR | Status: AC
  Administered 2019-01-09: 80 mg via INTRAVENOUS
  Filled 2019-01-09: qty 8

## 2019-01-09 MED ORDER — POTASSIUM CHLORIDE CRYS ER 20 MEQ PO TBCR
40.0000 meq | EXTENDED_RELEASE_TABLET | Freq: Every day | ORAL | Status: DC
  Administered 2019-01-10 – 2019-01-11 (×2): 40 meq via ORAL
  Filled 2019-01-09 (×2): qty 2

## 2019-01-09 MED ORDER — LISINOPRIL 5 MG PO TABS
5.0000 mg | ORAL_TABLET | Freq: Every day | ORAL | Status: DC
  Administered 2019-01-09 – 2019-01-11 (×3): 5 mg via ORAL
  Filled 2019-01-09 (×3): qty 1

## 2019-01-09 MED ORDER — SODIUM CHLORIDE 0.9 % IV SOLN: 250.0000 mL | INTRAVENOUS | Status: DC | PRN

## 2019-01-09 MED ORDER — RIVAROXABAN 20 MG PO TABS
20.0000 mg | ORAL_TABLET | Freq: Every day | ORAL | Status: DC
  Administered 2019-01-10 – 2019-01-11 (×2): 20 mg via ORAL
  Filled 2019-01-09 (×2): qty 1

## 2019-01-09 MED ORDER — ACETAMINOPHEN 325 MG PO TABS: 650.0000 mg | ORAL_TABLET | ORAL | Status: DC | PRN

## 2019-01-09 MED ORDER — SODIUM CHLORIDE 0.9% FLUSH
3.0000 mL | Freq: Two times a day (BID) | INTRAVENOUS | Status: DC
  Administered 2019-01-09 – 2019-01-11 (×4): 3 mL via INTRAVENOUS

## 2019-01-09 MED ORDER — TAMSULOSIN HCL 0.4 MG PO CAPS
0.4000 mg | ORAL_CAPSULE | Freq: Every day | ORAL | Status: DC
  Administered 2019-01-09 – 2019-01-11 (×3): 0.4 mg via ORAL
  Filled 2019-01-09 (×3): qty 1

## 2019-01-09 MED ORDER — ONDANSETRON HCL 4 MG/2ML IJ SOLN: 4.0000 mg | Freq: Four times a day (QID) | INTRAMUSCULAR | Status: DC | PRN

## 2019-01-09 MED ORDER — PANTOPRAZOLE SODIUM 40 MG PO TBEC
40.0000 mg | DELAYED_RELEASE_TABLET | Freq: Every day | ORAL | Status: DC
  Administered 2019-01-10 – 2019-01-11 (×2): 40 mg via ORAL
  Filled 2019-01-09 (×2): qty 1

## 2019-01-09 MED ORDER — SPIRONOLACTONE 25 MG PO TABS
50.0000 mg | ORAL_TABLET | Freq: Every day | ORAL | Status: DC
  Administered 2019-01-10 – 2019-01-11 (×2): 50 mg via ORAL
  Filled 2019-01-09 (×2): qty 2

## 2019-01-09 MED ORDER — FERROUS GLUCONATE 324 (38 FE) MG PO TABS
324.0000 mg | ORAL_TABLET | Freq: Two times a day (BID) | ORAL | Status: DC
  Administered 2019-01-09 – 2019-01-11 (×4): 324 mg via ORAL
  Filled 2019-01-09 (×8): qty 1

## 2019-01-09 MED ORDER — SODIUM CHLORIDE 0.9% FLUSH: 3.0000 mL | INTRAVENOUS | Status: DC | PRN

## 2019-01-09 MED ORDER — FUROSEMIDE 10 MG/ML IJ SOLN
80.0000 mg | Freq: Two times a day (BID) | INTRAMUSCULAR | Status: DC
  Administered 2019-01-09 – 2019-01-11 (×4): 80 mg via INTRAVENOUS
  Filled 2019-01-09 (×4): qty 8

## 2019-01-09 MED ORDER — ATORVASTATIN CALCIUM 40 MG PO TABS
40.0000 mg | ORAL_TABLET | Freq: Every day | ORAL | Status: DC
  Administered 2019-01-10 – 2019-01-11 (×2): 40 mg via ORAL
  Filled 2019-01-09 (×2): qty 1

## 2019-01-09 NOTE — ED Provider Notes (Addendum)
[facility] Provider Note   CSN: [id number] Arrival date & time: 01/09/19  0741    History   Chief Complaint Chief Complaint  Patient presents with  . Shortness of Breath    HPI [name] is a 53 y.o. male.     HPI Patient presents with shortness of breath.  Had for last couple days.  Worse with lying flat.  States he has some dull chest pain at time.  History of CHF.  Also history of noncompliance.  Reviewed records and was seen by care management 3 days ago over the phone.  He stated to her that he had not been taking the "fake Lasix" because it does not work.  States he feels as if his weight is doing well now.  Seen in the ER a week ago given Lasix and discharged home.  He has 20 visits to the ER in the last 6 months and is been admitted 11 times.  No fevers.  No cough.  Somewhat difficult to get history from. Past Medical History:  Diagnosis Date  . Allergic rhinitis   . Asthma   . Atrial fibrillation (HCC)   . CHF (congestive heart failure) (HCC)   . Elevated troponin level not due myocardial infarction 06/13/2015   cath w/ nl cors, felt 2nd HTN, CHF, peat trop 2.73  . Essential hypertension   . Gastroesophageal reflux disease   . History of cardiac catheterization    Normal coronaries December 2016  . Noncompliance    Major problem leading to declining health and recurrent hospitalization  . Nonischemic cardiomyopathy (HCC)    a. LVEF 20-25% in 2017 with cath in 2016 showing normal cors. b. EF 55% by echo in 03/2018.  . On home O2    3L N/C  . OSA (obstructive sleep apnea)   . Osteoarthritis   . Peptic ulcer disease     Patient Active Problem List   Diagnosis Date Noted  . Cellulitis of right leg   . Acute diastolic CHF (congestive heart failure) (HCC) 12/15/2018  . Cellulitis 12/08/2018  . Chronic obstructive pulmonary disease (HCC)   . Benign prostatic hyperplasia without lower urinary tract symptoms   . Acute on chronic combined  systolic and diastolic CHF (congestive heart failure) (HCC) 10/02/2018  . AKI (acute kidney injury) (HCC) 09/18/2018  . Acute on chronic combined systolic and diastolic congestive heart failure (HCC) 08/15/2018  . SOB (shortness of breath) 08/03/2018  . Volume overload 07/22/2018  . Acute systolic CHF (congestive heart failure) (HCC) 07/04/2018  . Acute on chronic combined systolic and diastolic heart failure (HCC) 04/01/2018  . Hypomagnesemia 04/01/2018  . Acute on chronic diastolic CHF (congestive heart failure) (HCC) 03/11/2018  . Pressure injury of skin 03/11/2018  . CHF (congestive heart failure) (HCC) 09/08/2017  . Altered mental status 05/11/2017  . Acute exacerbation of CHF (congestive heart failure) (HCC) 04/20/2017  . Acute on chronic systolic (congestive) heart failure (HCC) 04/19/2017  . CHF exacerbation (HCC) 04/11/2017  . Chronic respiratory failure with hypoxia (HCC) 04/11/2017  . Encounter for hospice care discussion   . Palliative care encounter   . Goals of care, counseling/discussion   . DNR (do not resuscitate)   . DNR (do not resuscitate) discussion   . Chronic congestive heart failure (HCC) 02/18/2017  . Acute systolic heart failure (HCC) 01/18/2017  . Pedal edema 12/02/2016  . Acute CHF (congestive heart failure) (HCC) 11/09/2016  . Acute on chronic systolic CHF (congestive heart failure) (  HCC) 08/19/2016  . Chronic renal failure, stage 2 (mild) 05/17/2016  . Coronary arteries, normal Dec 2016 01/23/2016  . Chronic anticoagulation-Xarelto 01/23/2016  . NSVT (nonsustained ventricular tachycardia) (HCC) 01/23/2016  . Elevated troponin 12/21/2015  . Nonischemic cardiomyopathy (HCC)   . Obesity, Class III, BMI 40-49.9 (morbid obesity) (HCC)   . Noncompliance with diet and medication regimen 12/02/2015  . OSA (obstructive sleep apnea) 09/20/2015  . Pulmonary hypertension (HCC) 09/19/2015  . Normocytic anemia 09/19/2015  . Atrial fibrillation, chronic   .  Dyspnea on exertion 05/28/2015  . Acute respiratory failure with hypoxia (HCC) 04/01/2015  . Hypokalemia 04/01/2015  . Obesity hypoventilation syndrome (HCC) 08/08/2014  . GERD (gastroesophageal reflux disease) 08/08/2014  . Edema of right lower extremity 06/21/2014  . Fecal urgency 06/21/2014  . Asthma 02/11/2009  . Hyperlipidemia 07/12/2008  . OBESITY, MORBID 07/12/2008  . Anxiety state 07/12/2008  . DEPRESSION 07/12/2008  . Essential hypertension 07/12/2008  . ALLERGIC RHINITIS 07/12/2008  . Peptic ulcer 07/12/2008  . OSTEOARTHRITIS 07/12/2008    Past Surgical History:  Procedure Laterality Date  . CARDIAC CATHETERIZATION N/A 06/14/2015   Procedure: Right/Left Heart Cath and Coronary Angiography;  Surgeon: Runell Gess, MD;  Location: [facility] INVASIVE CV LAB;  Service: Cardiovascular;  Laterality: N/A;        Home Medications    Prior to Admission medications   Medication Sig Start Date End Date Taking? Authorizing Provider  acetaminophen (TYLENOL) 325 MG tablet Take 2 tablets (650 mg total) by mouth every 4 (four) hours as needed for headache or mild pain. 11/02/18  Yes Calvert Cantor, MD  albuterol (PROVENTIL) (2.5 MG/3ML) 0.083% nebulizer solution Take 2.5 mg by nebulization every 6 (six) hours as needed for wheezing or shortness of breath.   Yes [provider]  atorvastatin (LIPITOR) 40 MG tablet Take 40 mg by mouth daily.  09/05/18  Yes [provider]  carvedilol (COREG) 25 MG tablet Take 1 tablet (25 mg total) by mouth 2 (two) times daily with a meal for 30 days. 08/18/18 11/04/19 Yes Shah, Pratik D, DO  docusate sodium (COLACE) 100 MG capsule Take 1 capsule (100 mg total) by mouth 2 (two) times daily. Patient taking differently: Take 100 mg by mouth every morning.  06/25/16  Yes Kathlen Mody, MD  doxycycline (VIBRAMYCIN) 100 MG capsule Take 1 capsule (100 mg total) by mouth 2 (two) times daily. 01/02/19  Yes Rancour, Jeannett Senior, MD  ferrous gluconate (FERGON)  324 MG tablet Take 1 tablet (324 mg total) by mouth 2 (two) times daily with a meal. Patient taking differently: Take 324 mg by mouth daily.  06/25/16  Yes Kathlen Mody, MD  fluticasone (FLOVENT HFA) 220 MCG/ACT inhaler Inhale 2 puffs into the lungs daily as needed (for shortness of breath).    Yes [provider]  furosemide (LASIX) 80 MG tablet Take 1.5 tablets (120 mg total) by mouth 2 (two) times daily. 12/18/18  Yes Vassie Loll, MD  lisinopril (ZESTRIL) 10 MG tablet Take 20 mg by mouth daily.  11/22/18  Yes [provider]  oxyCODONE-acetaminophen (PERCOCET/ROXICET) 5-325 MG tablet Take 1 tablet by mouth every 6 (six) hours as needed. 12/30/18  Yes Bethann Berkshire, MD  pantoprazole (PROTONIX) 40 MG tablet Take 1 tablet (40 mg total) by mouth daily at 6 (six) AM. 06/26/16  Yes Kathlen Mody, MD  potassium chloride SA (K-DUR) 20 MEQ tablet Take 2 tablets (40 mEq total) by mouth daily. 12/18/18  Yes Vassie Loll, MD  rivaroxaban Carlena Hurl)  20 MG TABS tablet Take 1 tablet (20 mg total) by mouth daily with breakfast. Patient taking differently: Take 20 mg by mouth daily with supper.  06/26/16  Yes Kathlen ModyAkula, Vijaya, MD  silver sulfADIAZINE (SILVADENE) 1 % cream Apply 1 application topically daily. 01/02/19  Yes Rancour, Jeannett SeniorStephen, MD  spironolactone (ALDACTONE) 100 MG tablet Take 0.5 tablets (50 mg total) by mouth daily. For fluid 12/18/18  Yes Vassie LollMadera, Carlos, MD  tamsulosin (FLOMAX) 0.4 MG CAPS capsule Take 1 capsule (0.4 mg total) by mouth daily. 06/25/16  Yes Kathlen ModyAkula, Vijaya, MD  potassium chloride 20 MEQ TBCR Take 20 mEq by mouth 2 (two) times daily. 09/03/15 11/04/18  Ward, Layla MawKristen N, DO    Family History Family History  Problem Relation Age of Onset  . Stroke Father   . Heart attack Father   . Aneurysm Mother        Cerebral aneurysm  . Hypertension Sister   . Colon cancer Neg Hx   . Inflammatory bowel disease Neg Hx   . Liver disease Neg Hx     Social History Social History    Tobacco Use  . Smoking status: Former Smoker    Packs/day: 0.50    Years: 20.00    Pack years: 10.00    Types: Cigarettes    Start date: 04/26/1988    Quit date: 06/23/2007    Years since quitting: 11.5  . Smokeless tobacco: Never Used  . Tobacco comment: 1 ppd former smoker  Substance Use Topics  . Alcohol use: Not Currently    Alcohol/week: 0.0 standard drinks    Comment: No etoh since 2009  . Drug use: No     Allergies   Banana, Bee venom, Strawberry flavor, Aspirin, Metolazone, Oatmeal, Orange juice [orange oil], Torsemide, Diltiazem, Hydralazine, and Lipitor [atorvastatin]   Review of Systems Review of Systems  Constitutional: Negative for appetite change and fever.  HENT: Negative for congestion.   Respiratory: Positive for shortness of breath.   Cardiovascular: Positive for chest pain and leg swelling.  Gastrointestinal: Negative for abdominal pain.  Genitourinary: Negative for flank pain.  Musculoskeletal: Negative for back pain.  Skin: Negative for rash.  Neurological: Negative for weakness.  Psychiatric/Behavioral: The patient is not hyperactive.      Physical Exam Updated Vital Signs BP (!) 175/152   Pulse (!) 102   Temp 98.6 F (37 C) (Oral)   Resp (!) 32   Ht 6\' 2"  (1.88 m)   Wt (!) 170.1 kg   SpO2 97%   BMI 48.15 kg/m   Physical Exam Constitutional:      Comments: Patient is wearing sunglasses sitting in bed.  Patient is obese.  HENT:     Head: Atraumatic.  Cardiovascular:     Rate and Rhythm: Rhythm irregular.  Pulmonary:     Comments: No respiratory distress, but somewhat difficult lung exam due to body habitus. Chest:     Chest wall: No tenderness.  Musculoskeletal:     Right lower leg: Edema present.     Left lower leg: Edema present.     Comments: Chronic venous changes bilateral lower extremities.  Skin:    General: Skin is warm.      ED Treatments / Results  Labs (all labs ordered are listed, but only abnormal results are  displayed) Labs Reviewed  BASIC METABOLIC PANEL - Abnormal; Notable for the following components:      Result Value   Calcium 8.3 (*)    All other components within  normal limits  BRAIN NATRIURETIC PEPTIDE - Abnormal; Notable for the following components:   B Natriuretic Peptide 408.0 (*)    All other components within normal limits  CBC - Abnormal; Notable for the following components:   RBC 4.18 (*)    Hemoglobin 11.7 (*)    HCT 37.3 (*)    All other components within normal limits  TROPONIN I (HIGH SENSITIVITY) - Abnormal; Notable for the following components:   Troponin I (High Sensitivity) 20.00 (*)    All other components within normal limits  POC OCCULT BLOOD, ED  TROPONIN I (HIGH SENSITIVITY)    EKG EKG Interpretation  Date/Time:  Monday January 09 2019 07:47:17 EDT Ventricular Rate:  91 PR Interval:    QRS Duration: 92 QT Interval:  396 QTC Calculation: 488 R Axis:   55 Text Interpretation:  Atrial fibrillation Borderline prolonged QT interval pvcs have resolved Confirmed by Davonna Belling [phone]) on 01/09/2019 8:09:11 AM   Radiology Dg Chest Portable 1 View  Result Date: 01/09/2019 CLINICAL DATA:  Increased shortness of breath over the past 2 days. EXAM: PORTABLE CHEST 1 VIEW COMPARISON:  01/03/2019. FINDINGS: Stable enlarged cardiac silhouette. Mild increase in prominence of the pulmonary vasculature and interstitial markings. Possible small bilateral pleural effusions. Thoracic spine degenerative changes. IMPRESSION: Mildly progressive changes of congestive heart failure with stable cardiomegaly. Electronically Signed   By: Claudie Revering M.D.   On: 01/09/2019 08:29    Procedures Procedures (including critical care time)  Medications Ordered in ED Medications - No data to display   Initial Impression / Assessment and Plan / ED Course  I have reviewed the triage vital signs and the nursing notes.  Pertinent labs & imaging results that were available during my  care of the patient were reviewed by me and considered in my medical decision making (see chart for details).        Patient presents with shortness of breath.  Has had reported noncompliance with his blood pressure medications.  BNP is elevated above his baseline.  It is lower than the level he had with recent admission to the hospital.  Also has a mild worsening of anemia.  Guaiac negative.  With elevated BNP and anemia and worsening shortness of breath feel patient benefit from [place] to the hospital.  Will discuss with hospitalist. 6 days ago patient had ER visit where he was given Lasix and discharged home.  Has not managed at home.  Final Clinical Impressions(s) / ED Diagnoses   Final diagnoses:  Acute on chronic congestive heart failure, unspecified heart failure type ([place])  Anemia, unspecified type    ED Discharge Orders    None       Davonna Belling, MD 01/09/19 1108    Davonna Belling, MD 01/09/19 1109

## 2019-01-09 NOTE — ED Notes (Signed)
EDP cut O2 off

## 2019-01-09 NOTE — H&P (Signed)
History and Physical  Samuel KlippelMark T Dunavan OZH:[id number] DOB: 08/24/1965 DOA: 01/09/2019   PCP: Pearson GrippeKim, [name], MD   Patient coming from: Home  Chief Complaint: sob  HPI:  [name] is a 53 y.o. male with medical history of [age] y/o male withwith medical history of history significant of sCHF (numerous admissions for uncompensated CHF but patient continues to be noncompliant with diet and medications outpatient and has been fired from numerous cardiology clinics secondary to no showing for appointments), atrial fibrillation (on xarelto), HTN, MI, asthma, OSA (no on CPAP or BiPap) presents with progressive leg swelling and sob since d/c from his last admission in the hospital from 12/14/2018 through 12/18/2018.  During that hospital admission, the patient was diagnosed with cellulitis of his right lower extremity.  He was sent home with doxycycline.  He states that he has only been taking his furosemide intermittently because he felt like it was causing him to be dizzy.  He said that he weighed himself at home a few days prior to this admission and it was 372 pounds.  The patient presents again with 2 days of shortness of breath and worsening lower extremity edema as well as increasing abdominal girthand orthopnea. He denies any fevers, chills, headache, nausea, vomiting, diarrhea. He has a nonproductive cough which is chronic. He is chronically on 3 L nasal cannula. He endorses compliance with his medications, but it is [facility] that he is noncompliant with meds and diet. According [facility], "Mr. Christell Faithrcher admits to continuously consuming high sodium foods. He continues to decline referrals for nutritional counseling. Based on our conversation today, he does not intend to change his diet. He states, "it's not the food, it's the medicine."He prefers IV diuretics." In fact, the patient tell me he ate "french fries yesterday". In addition, he frequently has told  different providers different dosing of his home furosemide. In fact, he recounts 2 different dosing regimens to me during the same conversation.  In the emergency department, the patient was afebrile hemodynamically stable saturating 97-100% on 3 L.  BMP and CBC were essentially unremarkable.  Troponin was 20>>21.  Chest x-ray showed increasing vascular congestion.  FOBT was negative.  EKG showed atrial fibrillation with no ST-T wave changes.  Assessment/Plan: Acute on chronic systolicand diastolicCHF -Hispriordry weight312 lbs on 02/08/17 -weight gain in part due to excess caloric intake -most recent "dry weight" approx 354 -2/27 discharge weight 364 lbs -10/07/18 discharge weight 358 lbs -12/18/18 discharge weight 354lbs Start lasix to80mg  IV BID -5/22/18Echo--EF 35-40 percent, diffuse HK, PSP 56, mild MR, +PFO -04/01/18 Echo EF 55%, no WMA - 1500ml fluid restriction -reset dry weight--likely be weighing heavier now due to eating habits and sedentary lifestyle. - pt states he eats "french fries" regularly -continue spiro, coreg, IV lasix  Permanentatrial fibrillation - Continue xarelto - continuecoreg  Chronic respiratory Failure with hypoxia -chronically on 3-3.5L at home  HTN -continue coreg, spiro, lasix -improving with diuresis -lisinopril 5 mg--continue  GERD - continue protoninx  Hyperlipidemia -continue statin  Chronic lymphedema -pt noncompliant with therapies  CKD stage 2 -baseline creatinine 1.0-1.3 -monitor with diuresis  OSA -noncompliant with CPAP  Hypokalemia -replete  Morbid Obesity -BMI 48.15 -lifestyle modification        Past Medical History:  Diagnosis Date  . Allergic rhinitis   . Asthma   . Atrial fibrillation (HCC)   . CHF (congestive heart failure) (HCC)   . Elevated troponin level  not due myocardial infarction 06/13/2015   cath w/ nl cors, felt 2nd HTN, CHF, peat trop 2.73  . Essential hypertension   .  Gastroesophageal reflux disease   . History of cardiac catheterization    Normal coronaries December 2016  . Noncompliance    Major problem leading to declining health and recurrent hospitalization  . Nonischemic cardiomyopathy (HCC)    a. LVEF 20-25% in 2017 with cath in 2016 showing normal cors. b. EF 55% by echo in 03/2018.  . On home O2    3L N/C  . OSA (obstructive sleep apnea)   . Osteoarthritis   . Peptic ulcer disease    Past Surgical History:  Procedure Laterality Date  . CARDIAC CATHETERIZATION N/A 06/14/2015   Procedure: Right/Left Heart Cath and Coronary Angiography;  Surgeon: Runell GessJonathan J Berry, MD;  [facility];  Service: Cardiovascular;  Laterality: N/A;   Social History:  reports that he quit smoking about 11 years ago. His smoking use included cigarettes. He started smoking about 30 years ago. He has a 10.00 pack-year smoking history. He has never used smokeless tobacco. He reports previous alcohol use. He reports that he does not use drugs.   Family History  Problem Relation Age of Onset  . Stroke Father   . Heart attack Father   . Aneurysm Mother        Cerebral aneurysm  . Hypertension Sister   . Colon cancer Neg Hx   . Inflammatory bowel disease Neg Hx   . Liver disease Neg Hx      Allergies  Allergen Reactions  . Banana Shortness Of Breath  . Bee Venom Shortness Of Breath, Swelling and Other (See Comments)    Reaction:  Facial swelling  . Strawberry Flavor Shortness Of Breath and Rash  . Aspirin Other (See Comments)    Reaction:  GI upset   . Metolazone Other (See Comments)    Pt states that he stopped taking this med due to heart attack like symptoms.   . Oatmeal Nausea And Vomiting  . Orange Juice [Orange Oil] Nausea And Vomiting    All acidic products make him nauseous and upset stomach  . Torsemide Swelling and Other (See Comments)    Reaction:  Swelling of feet/legs   . Diltiazem Palpitations  . Hydralazine Palpitations  .  Lipitor [Atorvastatin] Other (See Comments)    Reaction:  Nose bleeds : Patient is currently taking this medication     Prior to Admission medications   Medication Sig Start Date End Date Taking? Authorizing Provider  acetaminophen (TYLENOL) 325 MG tablet Take 2 tablets (650 mg total) by mouth every 4 (four) hours as needed for headache or mild pain. 11/02/18  Yes Calvert Cantorizwan, Saima, MD  albuterol (PROVENTIL) (2.5 MG/3ML) 0.083% nebulizer solution Take 2.5 mg by nebulization every 6 (six) hours as needed for wheezing or shortness of breath.   Yes [provider]  atorvastatin (LIPITOR) 40 MG tablet Take 40 mg by mouth daily.  09/05/18  Yes [provider]  carvedilol (COREG) 25 MG tablet Take 1 tablet (25 mg total) by mouth 2 (two) times daily with a meal for 30 days. 08/18/18 11/04/19 Yes Shah, Pratik D, DO  docusate sodium (COLACE) 100 MG capsule Take 1 capsule (100 mg total) by mouth 2 (two) times daily. Patient taking differently: Take 100 mg by mouth every morning.  06/25/16  Yes Kathlen ModyAkula, Vijaya, MD  doxycycline (VIBRAMYCIN) 100 MG capsule Take 1 capsule (100 mg total)  by mouth 2 (two) times daily. 01/02/19  Yes Rancour, Jeannett Senior, MD  ferrous gluconate (FERGON) 324 MG tablet Take 1 tablet (324 mg total) by mouth 2 (two) times daily with a meal. Patient taking differently: Take 324 mg by mouth daily.  06/25/16  Yes Kathlen Mody, MD  fluticasone (FLOVENT HFA) 220 MCG/ACT inhaler Inhale 2 puffs into the lungs daily as needed (for shortness of breath).    Yes [provider]  furosemide (LASIX) 80 MG tablet Take 1.5 tablets (120 mg total) by mouth 2 (two) times daily. 12/18/18  Yes Vassie Loll, MD  lisinopril (ZESTRIL) 10 MG tablet Take 20 mg by mouth daily.  11/22/18  Yes [provider]  oxyCODONE-acetaminophen (PERCOCET/ROXICET) 5-325 MG tablet Take 1 tablet by mouth every 6 (six) hours as needed. 12/30/18  Yes Bethann Berkshire, MD  pantoprazole (PROTONIX) 40 MG tablet Take 1  tablet (40 mg total) by mouth daily at 6 (six) AM. 06/26/16  Yes Kathlen Mody, MD  potassium chloride SA (K-DUR) 20 MEQ tablet Take 2 tablets (40 mEq total) by mouth daily. 12/18/18  Yes Vassie Loll, MD  rivaroxaban (XARELTO) 20 MG TABS tablet Take 1 tablet (20 mg total) by mouth daily with breakfast. Patient taking differently: Take 20 mg by mouth daily with supper.  06/26/16  Yes Kathlen Mody, MD  silver sulfADIAZINE (SILVADENE) 1 % cream Apply 1 application topically daily. 01/02/19  Yes Rancour, Jeannett Senior, MD  spironolactone (ALDACTONE) 100 MG tablet Take 0.5 tablets (50 mg total) by mouth daily. For fluid 12/18/18  Yes Vassie Loll, MD  tamsulosin (FLOMAX) 0.4 MG CAPS capsule Take 1 capsule (0.4 mg total) by mouth daily. 06/25/16  Yes Kathlen Mody, MD  potassium chloride 20 MEQ TBCR Take 20 mEq by mouth 2 (two) times daily. 09/03/15 11/04/18  Ward, Layla Maw, DO    Review of Systems:  Constitutional:  No weight loss, night sweats, Fevers, chills, fatigue.  Head&Eyes: No headache.  No vision loss.  No eye pain or scotoma ENT:  No Difficulty swallowing,Tooth/dental problems,Sore throat,  No ear ache, post nasal drip,  Cardio-vascular:  No chest pain, Orthopnea, PND, swelling in lower extremities,  dizziness, palpitations  GI:  No  abdominal pain, vomiting, diarrhea, loss of appetite, hematochezia, melena, heartburn, indigestion, Resp:  No shortness of breath with exertion or at rest. No cough. No coughing up of blood .No wheezing.No chest wall deformity  Skin:  no rash or lesions.  GU:  no dysuria, change in color of urine, no urgency or frequency. No flank pain.  Musculoskeletal:  No decreased range of motion. No back pain.  Psych:  No change in mood or affect. No depression or anxiety. Neurologic: No headache, no dysesthesia, no focal weakness, no vision loss. No syncope  Physical Exam: Vitals:   01/09/19 0830 01/09/19 0900 01/09/19 1000 01/09/19 1044  BP: (!) 169/114 (!)  153/115 (!) 175/152   Pulse: (!) 52 90 (!) 102   Resp: (!) 22 17 (!) 32   Temp:      TempSrc:      SpO2: 100% 94% 90% 97%  Weight:      Height:       General:  A&O x 3, NAD, nontoxic, pleasant/cooperative Head/Eye: No conjunctival hemorrhage, no icterus, [place]/AT, No nystagmus ENT:  No icterus,  No thrush, good dentition, no pharyngeal exudate Neck:  No masses, no lymphadenpathy, no bruits CV:  RRR, no rub, no gallop, no S3 Lung:  CTAB, good air movement, no wheeze, no  rhonchi Abdomen: soft/NT, +BS, nondistended, no peritoneal signs Ext: No cyanosis, No rashes, No petechiae, No lymphangitis, No edema Neuro: CNII-XII intact, strength 4/5 in bilateral upper and lower extremities, no dysmetria  Labs on Admission:  Basic Metabolic Panel: Recent Labs  Lab 01/03/19 0315 01/09/19 0830  NA 141 140  K 3.3* 3.5  CL 104 107  CO2 28 25  GLUCOSE 104* 96  BUN 14 13  CREATININE 1.03 0.98  CALCIUM 8.5* 8.3*   Liver Function Tests: No results for input(s): AST, ALT, ALKPHOS, BILITOT, PROT, ALBUMIN in the last 168 hours. No results for input(s): LIPASE, AMYLASE in the last 168 hours. No results for input(s): AMMONIA in the last 168 hours. CBC: Recent Labs  Lab 01/03/19 0315 01/09/19 0830  WBC 3.9* 4.3  NEUTROABS 2.8  --   HGB 13.1 11.7*  HCT 41.7 37.3*  MCV 88.3 89.2  PLT 183 212   Coagulation Profile: No results for input(s): INR, PROTIME in the last 168 hours. Cardiac Enzymes: No results for input(s): CKTOTAL, CKMB, CKMBINDEX, TROPONINI in the last 168 hours. BNP: Invalid input(s): POCBNP CBG: No results for input(s): GLUCAP in the last 168 hours. Urine analysis:    Component Value Date/Time   COLORURINE YELLOW 04/01/2018 0332   APPEARANCEUR CLEAR 04/01/2018 0332   LABSPEC 1.008 04/01/2018 0332   PHURINE 7.0 04/01/2018 0332   GLUCOSEU NEGATIVE 04/01/2018 0332   GLUCOSEU NEG mg/dL 01/24/2010 2254   HGBUR NEGATIVE 04/01/2018 0332   BILIRUBINUR NEGATIVE 04/01/2018 0332    KETONESUR NEGATIVE 04/01/2018 0332   PROTEINUR NEGATIVE 04/01/2018 0332   UROBILINOGEN 0.2 08/08/2014 1445   NITRITE NEGATIVE 04/01/2018 0332   LEUKOCYTESUR NEGATIVE 04/01/2018 0332   Sepsis Labs: @LABRCNTIP (procalcitonin:4,lacticidven:4) )No results found for this or any previous visit (from the past 240 hour(s)).   Radiological Exams on Admission: Dg Chest Portable 1 View  Result Date: 01/09/2019 CLINICAL DATA:  Increased shortness of breath over the past 2 days. EXAM: PORTABLE CHEST 1 VIEW COMPARISON:  01/03/2019. FINDINGS: Stable enlarged cardiac silhouette. Mild increase in prominence of the pulmonary vasculature and interstitial markings. Possible small bilateral pleural effusions. Thoracic spine degenerative changes. IMPRESSION: Mildly progressive changes of congestive heart failure with stable cardiomegaly. Electronically Signed   By: Claudie Revering M.D.   On: 01/09/2019 08:29    EKG: Independently reviewed. Afib, no sTT changes    Time spent:60 minutes Code Status:   FULL Family Communication:  No Family at bedside Disposition Plan: expect 2-3 day hospitalization Consults called: none DVT Prophylaxis: Xarelto  Orson Eva, DO  [facility] Pager [phone]  If 7PM-7AM, please contact night-coverage [email or web address] Password [email or web address] 01/09/2019, 11:44 AM

## 2019-01-09 NOTE — ED Triage Notes (Signed)
Pt brought in by EMS due to increase SOB for 2 days. NO cough Sating 95% on room air EMS placed 3 L.. BP 172/88

## 2019-01-10 LAB — BASIC METABOLIC PANEL
Anion gap: 8 (ref 5–15)
BUN: 13 mg/dL (ref 6–20)
CO2: 28 mmol/L (ref 22–32)
Calcium: 8.2 mg/dL — ABNORMAL LOW (ref 8.9–10.3)
Chloride: 105 mmol/L (ref 98–111)
Creatinine, Ser: 1.04 mg/dL (ref 0.61–1.24)
GFR calc Af Amer: >60 mL/min (ref >60)
GFR calc non Af Amer: >60 mL/min (ref >60)
Glucose, Bld: 98 mg/dL (ref 70–99)
Potassium: 3.4 mmol/L — ABNORMAL LOW (ref 3.5–5.1)
Sodium: 141 mmol/L (ref 135–145)

## 2019-01-10 MED ORDER — POTASSIUM CHLORIDE CRYS ER 20 MEQ PO TBCR
40.0000 meq | EXTENDED_RELEASE_TABLET | Freq: Once | ORAL | Status: AC
  Administered 2019-01-10: 40 meq via ORAL
  Filled 2019-01-10: qty 2

## 2019-01-10 NOTE — Progress Notes (Signed)
Late entry for 1510. Patient had 2.54 second pause and 8 beat run of VT per central tele report. Patient asymptomatic on assessment. Vitals stable, see flowsheet. Notified Dr. Carles Collet via text-page. Donavan Foil, RN

## 2019-01-10 NOTE — Progress Notes (Addendum)
PROGRESS NOTE  Tammi KlippelMark T Dahan WJX:[id number] DOB: 12/28/1965 DOA: 01/09/2019 PCP: Pearson GrippeKim, [name], MD  Tammi KlippelMark T Bur is a 53 y.o. male with medical history of 53 y/o male withwith medical history of history significant of sCHF (numerous admissions for uncompensated CHF but patient continues to be noncompliant with diet and medications outpatient and has been fired from numerous cardiology clinics secondary to no showing for appointments), atrial fibrillation (on xarelto), HTN, MI, asthma, OSA (no on CPAP or BiPap) presents with progressive leg swelling and sob since d/c from his last admission in the hospital from 12/14/2018 through 12/18/2018.  During that hospital admission, the patient was diagnosed with cellulitis of his right lower extremity.  He was sent home with doxycycline.  He states that he has only been taking his furosemide intermittently because he felt like it was causing him to be dizzy.  He said that he weighed himself at home a few days prior to this admission and it was 372 pounds.  The patient presents again with 2 days of shortness of breath and worsening lower extremity edema as well as increasing abdominal girthand orthopnea. He denies any fevers, chills, headache, nausea, vomiting, diarrhea. He has a nonproductive cough which is chronic. He is chronically on 3 L nasal cannula. He endorses compliance with his medications, but [facility] that he is noncompliant with meds and diet. [facility], "Mr. Christell Faithrcher admits to continuously consuming high sodium foods. He continues to decline referrals for nutritional counseling. Based on our conversation today, he does not intend to change his diet. He states, "it's not the food, it's the medicine."He prefers IV diuretics." In fact, the patient tell me he ate "french fries yesterday". In addition, he frequently has told different providers different dosing of his home furosemide. In  fact, he recounts 2 different dosing regimens to me during the same conversation.  In the emergency department, the patient was afebrile hemodynamically stable saturating 97-100% on 3 L.  BMP and CBC were essentially unremarkable.  Troponin was 20>>21.  Chest x-ray showed increasing vascular congestion.  FOBT was negative.  EKG showed atrial fibrillation with no ST-T wave changes.  Assessment/Plan: Acute on chronic systolicand diastolicCHF -Hispriordry weight312 lbs on 02/08/17 -weight gain in part due to excess caloric intake -2/27 discharge weight 364 lbs -10/07/18 discharge weight 358 lbs -12/18/18 discharge weight 354lbs -01/10/19 weight 354 lbs -Continue lasix to80mg  IV BID -5/22/18Echo--EF 35-40 percent, diffuse HK, PSP 56, mild MR, +PFO -04/01/18 Echo EF 55%, no WMA - 1500ml fluid restriction -reset dry weight--likely be weighing heavier now due to eating habits and sedentary lifestyle. -NEG 5.6L - pt states he eats "french fries" regularly -continue spiro, coreg, IV lasix  Permanentatrial fibrillation - Continue xarelto - continuecoreg  Chronic respiratory Failure with hypoxia -chronically on 3-3.5L at home  HTN -continue coreg, spiro, lasix -improving with diuresis -lisinopril 5 mg--continue  GERD - continue protoninx  Hyperlipidemia -continue statin  Chronic lymphedema -pt noncompliant with therapies  CKD stage 2 -baseline creatinine 1.0-1.3 -monitor with diuresis  OSA -noncompliant with CPAP  Hypokalemia -replete  Morbid Obesity -BMI 48.74 -lifestyle modification  Nonsustained Vtach/Sinus pause -pt asymptomatic -had episode 01/10/19 pm -optimize electrolytes   Disposition Plan:   Home 7/22 or 7/23 Family Communication:  No Family at bedside  Consultants:  none  Code Status:  FULL DVT Prophylaxis:  Xarelto   Subjective: Pt breathing better, but still has  some dyspnea with exertion.  Denies cp. N/v/d, abd pain, f/c,  headache.  Objective: Vitals:   01/09/19 1951 01/09/19 2137 01/10/19 0517 01/10/19 1506  BP:  131/85 119/89 129/88  Pulse:  87 88 70  Resp:  (!) 24 (!) 24 18  Temp:  98.8 F (37.1 C) 98.9 F (37.2 C)   TempSrc:  Oral Oral   SpO2: (!) 86% 100% 95% 98%  Weight:   (!) 163 kg   Height:        Intake/Output Summary (Last 24 hours) at 01/10/2019 1733 Last data filed at 01/10/2019 1507 Gross per 24 hour  Intake 603 ml  Output 3825 ml  Net -3222 ml   Weight change:  Exam:   General:  Pt is alert, follows commands appropriately, not in acute distress  HEENT: No icterus, No thrush, No neck mass, [place]/AT  Cardiovascular: IRRR, S1/S2, no rubs, no gallops  Respiratory: bibasilar crackles  Abdomen: Soft/+BS, non tender, non distended, no guarding  Extremities: 3 +LE edema, No lymphangitis, No petechiae, No rashes, no synovitis   Data Reviewed: I have personally reviewed following labs and imaging studies Basic Metabolic Panel: Recent Labs  Lab 01/09/19 0830 01/10/19 0610  NA 140 141  K 3.5 3.4*  CL 107 105  CO2 25 28  GLUCOSE 96 98  BUN 13 13  CREATININE 0.98 1.04  CALCIUM 8.3* 8.2*   Liver Function Tests: No results for input(s): AST, ALT, ALKPHOS, BILITOT, PROT, ALBUMIN in the last 168 hours. No results for input(s): LIPASE, AMYLASE in the last 168 hours. No results for input(s): AMMONIA in the last 168 hours. Coagulation Profile: No results for input(s): INR, PROTIME in the last 168 hours. CBC: Recent Labs  Lab 01/09/19 0830  WBC 4.3  HGB 11.7*  HCT 37.3*  MCV 89.2  PLT 212   Cardiac Enzymes: No results for input(s): CKTOTAL, CKMB, CKMBINDEX, TROPONINI in the last 168 hours. BNP: Invalid input(s): POCBNP CBG: No results for input(s): GLUCAP in the last 168 hours. HbA1C: No results for input(s): HGBA1C in the last 72 hours. Urine analysis:    Component Value Date/Time   COLORURINE YELLOW 04/01/2018 0332   APPEARANCEUR CLEAR 04/01/2018 0332    LABSPEC 1.008 04/01/2018 0332   PHURINE 7.0 04/01/2018 0332   GLUCOSEU NEGATIVE 04/01/2018 0332   GLUCOSEU NEG mg/dL 20/35/5974 1638   HGBUR NEGATIVE 04/01/2018 0332   BILIRUBINUR NEGATIVE 04/01/2018 0332   KETONESUR NEGATIVE 04/01/2018 0332   PROTEINUR NEGATIVE 04/01/2018 0332   UROBILINOGEN 0.2 08/08/2014 1445   NITRITE NEGATIVE 04/01/2018 0332   LEUKOCYTESUR NEGATIVE 04/01/2018 0332   Sepsis Labs: @LABRCNTIP (procalcitonin:4,lacticidven:4) ) Recent Results (from the past 240 hour(s))  SARS Coronavirus 2 (CEPHEID - Performed in [facility] hospital lab), Hosp Order     Status: None   Collection Time: 01/09/19 11:19 AM   Specimen: Nasopharyngeal Swab  Result Value Ref Range Status   SARS Coronavirus 2 NEGATIVE NEGATIVE Final    Comment: (NOTE) If result is NEGATIVE SARS-CoV-2 target nucleic acids are NOT DETECTED. The SARS-CoV-2 RNA is generally detectable in upper and lower  respiratory specimens during the acute phase of infection. The lowest  concentration of SARS-CoV-2 viral copies this assay can detect is 250  copies / mL. A negative result does not preclude SARS-CoV-2 infection  and should not be used as the sole basis for treatment or other  patient management decisions.  A negative result may occur with  improper specimen collection / handling, submission of specimen other  than nasopharyngeal swab, presence of viral mutation(s) within the  areas targeted by this assay, and inadequate number of viral copies  (<250 copies / mL). A negative result must be combined with clinical  observations, patient history, and epidemiological information. If result is POSITIVE SARS-CoV-2 target nucleic acids are DETECTED. The SARS-CoV-2 RNA is generally detectable in upper and lower  respiratory specimens dur ing the acute phase of infection.  Positive  results are indicative of active infection with SARS-CoV-2.  Clinical  correlation with patient history and other diagnostic  information is  necessary to determine patient infection status.  Positive results do  not rule out bacterial infection or co-infection with other viruses. If result is PRESUMPTIVE POSTIVE SARS-CoV-2 nucleic acids MAY BE PRESENT.   A presumptive positive result was obtained on the submitted specimen  and confirmed on repeat testing.  While 2019 novel coronavirus  (SARS-CoV-2) nucleic acids may be present in the submitted sample  additional confirmatory testing may be necessary for epidemiological  and / or clinical management purposes  to differentiate between  SARS-CoV-2 and other Sarbecovirus currently known to infect humans.  If clinically indicated additional testing with an alternate test  methodology [phone]([id number]) is advised. The SARS-CoV-2 RNA is generally  detectable in upper and lower respiratory sp ecimens during the acute  phase of infection. The expected result is Negative. Fact Sheet for Patients:  BoilerBrush.com.cyhttps://www.fda.gov/media/136312/download Fact Sheet for Healthcare Providers: https://pope.com/https://www.fda.gov/media/136313/download This test is not yet approved or cleared by the [place] FDA and has been authorized for detection and/or diagnosis of SARS-CoV-2 by FDA under an Emergency Use Authorization (EUA).  This EUA will remain in effect (meaning this test can be used) for the duration of the COVID-19 declaration under Section 564(b)(1) of the Act, 21 U.S.C. section 360bbb-3(b)(1), unless the authorization is terminated or revoked sooner. Performed at [facility], [address] [place], [place] [address]      Scheduled Meds: . atorvastatin  40 mg Oral Daily  . carvedilol  25 mg Oral BID WC  . ferrous gluconate  324 mg Oral BID WC  . furosemide  80 mg Intravenous BID  . lisinopril  5 mg Oral Daily  . pantoprazole  40 mg Oral Q0600  . potassium chloride SA  40 mEq Oral Daily  . potassium chloride  40 mEq Oral Once  . rivaroxaban  20 mg Oral Q breakfast  . sodium chloride  flush  3 mL Intravenous Q12H  . spironolactone  50 mg Oral Daily  . tamsulosin  0.4 mg Oral Daily   Continuous Infusions: . sodium chloride      Procedures/Studies: Ct Head Wo Contrast  Result Date: 12/30/2018 CLINICAL DATA:  Altered level consciousness EXAM: CT HEAD WITHOUT CONTRAST TECHNIQUE: Contiguous axial images were obtained from the base of the skull through the vertex without intravenous contrast. COMPARISON:  05/11/2017 FINDINGS: Brain: No evidence of acute infarction, hemorrhage, hydrocephalus, extra-axial collection or mass lesion/mass effect. Vascular: No hyperdense vessel or unexpected calcification. Skull: Normal. Negative for fracture or focal lesion. Sinuses/Orbits: Mucosal retention cyst is noted in the maxillary antra bilaterally. Orbits and their contents are within normal limits. Other: None IMPRESSION: No acute abnormality noted. Electronically Signed   By: Alcide CleverMark  Lukens M.D.   On: 12/30/2018 23:42   Dg Chest Portable 1 View  Result Date: 01/09/2019 CLINICAL DATA:  Increased shortness of breath over the past 2 days. EXAM: PORTABLE CHEST 1 VIEW COMPARISON:  01/03/2019. FINDINGS: Stable enlarged cardiac silhouette. Mild increase in prominence  of the pulmonary vasculature and interstitial markings. Possible small bilateral pleural effusions. Thoracic spine degenerative changes. IMPRESSION: Mildly progressive changes of congestive heart failure with stable cardiomegaly. Electronically Signed   By: Claudie Revering M.D.   On: 01/09/2019 08:29   Dg Chest Portable 1 View  Result Date: 01/03/2019 CLINICAL DATA:  Shortness of breath EXAM: PORTABLE CHEST 1 VIEW COMPARISON:  01/02/2019, 12/14/2018, 12/08/2018 FINDINGS: Cardiomegaly with vascular congestion. No large effusion. Hazy opacity left base, likely attributable to overlying soft tissues. No pneumothorax. IMPRESSION: Limited by body habitus.  Cardiomegaly with vascular congestion. Electronically Signed   By: Donavan Foil M.D.   On:  01/03/2019 03:02   Dg Chest Portable 1 View  Result Date: 01/02/2019 CLINICAL DATA:  Shortness of breath and leg swelling EXAM: PORTABLE CHEST 1 VIEW COMPARISON:  12/14/2018 FINDINGS: Cardiomegaly with vascular pedicle widening and cephalized blood flow. No visible Kerley lines but limited by soft tissue attenuation. No effusion or pneumothorax IMPRESSION: Cardiomegaly and vascular congestion. Electronically Signed   By: Monte Fantasia M.D.   On: 01/02/2019 05:25   Dg Chest Portable 1 View  Result Date: 12/14/2018 CLINICAL DATA:  Shortness of breath EXAM: PORTABLE CHEST 1 VIEW COMPARISON:  December 08, 2018 FINDINGS: The heart is significantly enlarged. There is evidence of vascular congestion and pulmonary edema. No pneumothorax. There may be small bilateral pleural effusions. Evaluation is limited by patient body habitus. IMPRESSION: Cardiomegaly with persistent vascular congestion. Electronically Signed   By: Constance Holster M.D.   On: 12/14/2018 19:31    Orson Eva, DO  [facility] Pager [phone]  If 7PM-7AM, please contact night-coverage [email or web address] Password [facility] 01/10/2019, 5:33 PM   LOS: 1 day

## 2019-01-11 ENCOUNTER — Other Ambulatory Visit: Payer: Self-pay | Admitting: Pharmacist

## 2019-01-11 ENCOUNTER — Ambulatory Visit: Payer: Self-pay | Admitting: Pharmacist

## 2019-01-11 DIAGNOSIS — I482 Chronic atrial fibrillation, unspecified: Secondary | ICD-10-CM

## 2019-01-11 DIAGNOSIS — N182 Chronic kidney disease, stage 2 (mild): Secondary | ICD-10-CM

## 2019-01-11 DIAGNOSIS — I5043 Acute on chronic combined systolic (congestive) and diastolic (congestive) heart failure: Secondary | ICD-10-CM

## 2019-01-11 DIAGNOSIS — I10 Essential (primary) hypertension: Secondary | ICD-10-CM

## 2019-01-11 LAB — CBC
HCT: 44.4 % (ref 39.0–52.0)
Hemoglobin: 13.7 g/dL (ref 13.0–17.0)
MCH: 27.5 pg (ref 26.0–34.0)
MCHC: 30.9 g/dL (ref 30.0–36.0)
MCV: 89.2 fL (ref 80.0–100.0)
Platelets: 249 10*3/uL (ref 150–400)
RBC: 4.98 MIL/uL (ref 4.22–5.81)
RDW: 14.3 % (ref 11.5–15.5)
WBC: 3.9 10*3/uL — ABNORMAL LOW (ref 4.0–10.5)
nRBC: 0 % (ref 0.0–0.2)

## 2019-01-11 LAB — BASIC METABOLIC PANEL
Anion gap: 11 (ref 5–15)
BUN: 16 mg/dL (ref 6–20)
CO2: 28 mmol/L (ref 22–32)
Calcium: 8.9 mg/dL (ref 8.9–10.3)
Chloride: 103 mmol/L (ref 98–111)
Creatinine, Ser: 1.14 mg/dL (ref 0.61–1.24)
GFR calc Af Amer: >60 mL/min (ref >60)
GFR calc non Af Amer: >60 mL/min (ref >60)
Glucose, Bld: 91 mg/dL (ref 70–99)
Potassium: 3.7 mmol/L (ref 3.5–5.1)
Sodium: 142 mmol/L (ref 135–145)

## 2019-01-11 LAB — MAGNESIUM: Magnesium: 2.1 mg/dL (ref 1.7–2.4)

## 2019-01-11 NOTE — Discharge Summary (Signed)
Physician Discharge Summary  GAL LEFTON ALP:[id number] DOB: 1966/04/19 DOA: 01/09/2019  PCP: Pearson Grippe, MD  Admit date: 01/09/2019 Discharge date: 01/11/2019  Time spent: 35 minutes  Recommendations for Outpatient Follow-up:  1. Reassess patient's volume status and further adjust diuretic regimen as needed 2. Repeat basic metabolic panel to follow electrolytes and renal function.   Discharge Diagnoses:  Active Problems:   OBESITY, MORBID   Essential hypertension   Obesity hypoventilation syndrome (HCC)   Atrial fibrillation, chronic   Noncompliance with diet and medication regimen   Obesity, Class III, BMI 40-49.9 (morbid obesity) (HCC)   Chronic renal failure, stage 2 (mild)   Chronic respiratory failure with hypoxia (HCC)   Acute on chronic combined systolic and diastolic congestive heart failure (HCC)   Discharge Condition: Stable and improved.  Patient discharged home with instructions to follow-up with PCP in 10 days.  Diet recommendation: Low-sodium and low calorie diet recommended.  Filed Weights   01/09/19 1425 01/10/19 0517 01/11/19 0509  Weight: (!) 166.7 kg (!) 163 kg (!) 160.4 kg    History of present illness:  As per H&P written by Dr. Arbutus Leas on 01/09/2019 53 y.o. male with medical history of 53 y/o male withwith medical history of history significant of sCHF (numerous admissions for uncompensated CHF but patient continues to be noncompliant with diet and medications outpatient and has been fired from numerous cardiology clinics secondary to no showing for appointments), atrial fibrillation (on xarelto), HTN, MI, asthma, OSA (no on CPAP or BiPap) presents with progressive leg swelling and sob since d/c from his last admission in the hospital from 12/14/2018 through 12/18/2018.  During that hospital admission, the patient was diagnosed with cellulitis of his right lower extremity.  He was sent home with doxycycline.  He states that he has only been taking his furosemide  intermittently because he felt like it was causing him to be dizzy.  He said that he weighed himself at home a few days prior to this admission and it was 372 pounds.  The patient presents again with 2 days of shortness of breath and worsening lower extremity edema as well as increasing abdominal girthand orthopnea. He denies any fevers, chills, headache, nausea, vomiting, diarrhea. He has a nonproductive cough which is chronic. He is chronically on 3 L nasal cannula. He endorses compliance with his medications, but it is clear with [facility] outreach documentation that he is noncompliant with meds and diet. According to 10/28/18 [facility] visit, "Mr. Stolte admits to continuously consuming high sodium foods. He continues to decline referrals for nutritional counseling. Based on our conversation today, he does not intend to change his diet. He states, "it's not the food, it's the medicine."He prefers IV diuretics." In fact, the patient tell me he ate "french fries yesterday". In addition, he frequently has told different providers different dosing of his home furosemide. In fact, he recounts 2 different dosing regimens to me during the same conversation.  In the emergency department, the patient was afebrile hemodynamically stable saturating 97-100% on 3 L.  BMP and CBC were essentially unremarkable.  Troponin was 20>>21.  Chest x-ray showed increasing vascular congestion.  FOBT was negative.  EKG showed atrial fibrillation with no ST-T wave changes.  Hospital Course:  1-acute on chronic combined systolic and diastolic heart failure -Appears to be secondary to medication noncompliance and diet indiscretion -Still demonstrating some lower extremity edema but overall significantly improved.  No crackles on exam. -No orthopnea and good oxygen saturation on room air -  Encouraged to follow daily weights and low-sodium diet. -Continue to have good urine output  -Discharge on Lasix 120 mg 2 times a  day. -continue b-blocker, lisinopril and spironolactone.. -Most recent ejection fraction demonstrated 35-40%.  2-Elevated blood pressure in the setting of hypertension -Blood pressure stable and well-controlled -Advised to follow low-sodium diet -Resume home antihypertensive regimen.  3-chronic atrial fibrillation -continue beta-blocker -Rate remains controlled -Continue Xarelto for secondary prevention.  4-dyslipidemia  -continue statin  5-gastroesophageal reflux disease -Continue PPI  6-chronic kidney disease stage II -Creatinine 1.14 at discharge  -Repeat basic metabolic panel follow-up visit to reassess electrolytes and renal function trend.  7-BPH -No complaints of urinary retention currently -Continue Flomax.  8-chronic respiratory failure/COPD -Appears stable and in no acute exacerbation. -Continue Flovent and continue as needed oxygen supplementation (patient reported not using it lately). -Patient most likely with component of obesity hypoventilation syndrome. -Currently with good oxygen saturation on room air.  9-morbid obesity/obstructive sleep apnea -Body mass index is 48.11 kg/m. -low calorie diet, increase physical activity and portion control discussed with patient. -Noncompliant with CPAP   Procedures:  See below for x-ray reports.  Consultations:  None  Discharge Exam: Vitals:   01/10/19 2125 01/11/19 0509  BP: (!) 119/102 120/71  Pulse: 77 (!) 57  Resp: (!) 24 20  Temp: 98.5 F (36.9 C) 99.1 F (37.3 C)  SpO2: 95%     General: No chest pain, no nausea, no vomiting; patient denies orthopnea and demonstrated good O2 sat on room air. Cardiovascular: S1 and S2, no rubs, no gallops, no JVD Respiratory: Decreased breath sounds at the bases; no frank crackles, no wheezing, no using accessory muscles.  Normal respiratory effort.   Abdomen: Soft, nontender, Positive bowel sounds; obese on exam. Extremities: Chronic stasis dermatitis  appreciated; no open wounds, 1-2+ edema  Discharge Instructions   Discharge Instructions    Diet - low sodium heart healthy   Complete by: As directed    Discharge instructions   Complete by: As directed    Be compliant with medications Check weight on daily basis Follow low sodium diet (less than 2.5 gram daily)     Allergies as of 01/11/2019      Reactions   Banana Shortness Of Breath   Bee Venom Shortness Of Breath, Swelling, Other (See Comments)   Reaction:  Facial swelling   Strawberry Flavor Shortness Of Breath, Rash   Aspirin Other (See Comments)   Reaction:  GI upset    Metolazone Other (See Comments)   Pt states that he stopped taking this med due to heart attack like symptoms.    Oatmeal Nausea And Vomiting   Orange Juice [orange Oil] Nausea And Vomiting   All acidic products make him nauseous and upset stomach   Torsemide Swelling, Other (See Comments)   Reaction:  Swelling of feet/legs    Diltiazem Palpitations   Hydralazine Palpitations   Lipitor [atorvastatin] Other (See Comments)   Reaction:  Nose bleeds : Patient is currently taking this medication      Medication List    STOP taking these medications   doxycycline 100 MG capsule Commonly known as: VIBRAMYCIN     TAKE these medications   acetaminophen 325 MG tablet Commonly known as: TYLENOL Take 2 tablets (650 mg total) by mouth every 4 (four) hours as needed for headache or mild pain.   albuterol (2.5 MG/3ML) 0.083% nebulizer solution Commonly known as: PROVENTIL Take 2.5 mg by nebulization every 6 (six) hours as needed for  wheezing or shortness of breath.   atorvastatin 40 MG tablet Commonly known as: LIPITOR Take 40 mg by mouth daily.   carvedilol 25 MG tablet Commonly known as: COREG Take 1 tablet (25 mg total) by mouth 2 (two) times daily with a meal for 30 days.   docusate sodium 100 MG capsule Commonly known as: COLACE Take 1 capsule (100 mg total) by mouth 2 (two) times  daily. What changed: when to take this   ferrous gluconate 324 MG tablet Commonly known as: FERGON Take 1 tablet (324 mg total) by mouth 2 (two) times daily with a meal. What changed: when to take this   fluticasone 220 MCG/ACT inhaler Commonly known as: FLOVENT HFA Inhale 2 puffs into the lungs daily as needed (for shortness of breath).   furosemide 80 MG tablet Commonly known as: LASIX Take 1.5 tablets (120 mg total) by mouth 2 (two) times daily.   lisinopril 10 MG tablet Commonly known as: ZESTRIL Take 20 mg by mouth daily.   oxyCODONE-acetaminophen 5-325 MG tablet Commonly known as: PERCOCET/ROXICET Take 1 tablet by mouth every 6 (six) hours as needed.   pantoprazole 40 MG tablet Commonly known as: PROTONIX Take 1 tablet (40 mg total) by mouth daily at 6 (six) AM.   potassium chloride SA 20 MEQ tablet Commonly known as: K-DUR Take 2 tablets (40 mEq total) by mouth daily.   rivaroxaban 20 MG Tabs tablet Commonly known as: XARELTO Take 1 tablet (20 mg total) by mouth daily with breakfast. What changed: when to take this   silver sulfADIAZINE 1 % cream Commonly known as: SILVADENE Apply 1 application topically daily.   spironolactone 100 MG tablet Commonly known as: ALDACTONE Take 0.5 tablets (50 mg total) by mouth daily. For fluid   tamsulosin 0.4 MG Caps capsule Commonly known as: FLOMAX Take 1 capsule (0.4 mg total) by mouth daily.      Allergies  Allergen Reactions  . Banana Shortness Of Breath  . Bee Venom Shortness Of Breath, Swelling and Other (See Comments)    Reaction:  Facial swelling  . Strawberry Flavor Shortness Of Breath and Rash  . Aspirin Other (See Comments)    Reaction:  GI upset   . Metolazone Other (See Comments)    Pt states that he stopped taking this med due to heart attack like symptoms.   . Oatmeal Nausea And Vomiting  . Orange Juice [Orange Oil] Nausea And Vomiting    All acidic products make him nauseous and upset stomach  .  Torsemide Swelling and Other (See Comments)    Reaction:  Swelling of feet/legs   . Diltiazem Palpitations  . Hydralazine Palpitations  . Lipitor [Atorvastatin] Other (See Comments)    Reaction:  Nose bleeds : Patient is currently taking this medication   Follow-up Information    Pearson GrippeKim, [name], MD. Schedule an appointment as soon as possible for a visit in 10 day(s).   Specialty: Internal Medicine Contact information: [address] [place] [address] [phone]        Laqueta LindenKoneswaran, Suresh A, MD .   Specialty: Cardiology Contact information: [address] [place] [address] [phone]           The results of significant diagnostics from this hospitalization (including imaging, microbiology, ancillary and laboratory) are listed below for reference.    Significant Diagnostic Studies: Ct Head Wo Contrast  Result Date: 12/30/2018 CLINICAL DATA:  Altered level consciousness EXAM: CT HEAD WITHOUT CONTRAST TECHNIQUE: Contiguous  axial images were obtained from the base of the skull through the vertex without intravenous contrast. COMPARISON:  05/11/2017 FINDINGS: Brain: No evidence of acute infarction, hemorrhage, hydrocephalus, extra-axial collection or mass lesion/mass effect. Vascular: No hyperdense vessel or unexpected calcification. Skull: Normal. Negative for fracture or focal lesion. Sinuses/Orbits: Mucosal retention cyst is noted in the maxillary antra bilaterally. Orbits and their contents are within normal limits. Other: None IMPRESSION: No acute abnormality noted. Electronically Signed   By: Inez Catalina M.D.   On: 12/30/2018 23:42   Dg Chest Portable 1 View  Result Date: 01/09/2019 CLINICAL DATA:  Increased shortness of breath over the past 2 days. EXAM: PORTABLE CHEST 1 VIEW COMPARISON:  01/03/2019. FINDINGS: Stable enlarged cardiac silhouette. Mild increase in prominence of the pulmonary vasculature and interstitial markings. Possible small bilateral  pleural effusions. Thoracic spine degenerative changes. IMPRESSION: Mildly progressive changes of congestive heart failure with stable cardiomegaly. Electronically Signed   By: Claudie Revering M.D.   On: 01/09/2019 08:29   Dg Chest Portable 1 View  Result Date: 01/03/2019 CLINICAL DATA:  Shortness of breath EXAM: PORTABLE CHEST 1 VIEW COMPARISON:  01/02/2019, 12/14/2018, 12/08/2018 FINDINGS: Cardiomegaly with vascular congestion. No large effusion. Hazy opacity left base, likely attributable to overlying soft tissues. No pneumothorax. IMPRESSION: Limited by body habitus.  Cardiomegaly with vascular congestion. Electronically Signed   By: Donavan Foil M.D.   On: 01/03/2019 03:02   Dg Chest Portable 1 View  Result Date: 01/02/2019 CLINICAL DATA:  Shortness of breath and leg swelling EXAM: PORTABLE CHEST 1 VIEW COMPARISON:  12/14/2018 FINDINGS: Cardiomegaly with vascular pedicle widening and cephalized blood flow. No visible Kerley lines but limited by soft tissue attenuation. No effusion or pneumothorax IMPRESSION: Cardiomegaly and vascular congestion. Electronically Signed   By: Monte Fantasia M.D.   On: 01/02/2019 05:25   Dg Chest Portable 1 View  Result Date: 12/14/2018 CLINICAL DATA:  Shortness of breath EXAM: PORTABLE CHEST 1 VIEW COMPARISON:  December 08, 2018 FINDINGS: The heart is significantly enlarged. There is evidence of vascular congestion and pulmonary edema. No pneumothorax. There may be small bilateral pleural effusions. Evaluation is limited by patient body habitus. IMPRESSION: Cardiomegaly with persistent vascular congestion. Electronically Signed   By: Constance Holster M.D.   On: 12/14/2018 19:31    Microbiology: Recent Results (from the past 240 hour(s))  SARS Coronavirus 2 (CEPHEID - Performed in [facility] [facility] hospital lab), Hosp Order     Status: None   Collection Time: 01/09/19 11:19 AM   Specimen: Nasopharyngeal Swab  Result Value Ref Range Status   SARS Coronavirus 2 NEGATIVE  NEGATIVE Final    Comment: (NOTE) If result is NEGATIVE SARS-CoV-2 target nucleic acids are NOT DETECTED. The SARS-CoV-2 RNA is generally detectable in upper and lower  respiratory specimens during the acute phase of infection. The lowest  concentration of SARS-CoV-2 viral copies this assay can detect is 250  copies / mL. A negative result does not preclude SARS-CoV-2 infection  and should not be used as the sole basis for treatment or other  patient management decisions.  A negative result may occur with  improper specimen collection / handling, submission of specimen other  than nasopharyngeal swab, presence of viral mutation(s) within the  areas targeted by this assay, and inadequate number of viral copies  (<250 copies / mL). A negative result must be combined with clinical  observations, patient history, and epidemiological information. If result is POSITIVE SARS-CoV-2 target nucleic acids are DETECTED. The SARS-CoV-2 RNA  is generally detectable in upper and lower  respiratory specimens dur ing the acute phase of infection.  Positive  results are indicative of active infection with SARS-CoV-2.  Clinical  correlation with patient history and other diagnostic information is  necessary to determine patient infection status.  Positive results do  not rule out bacterial infection or co-infection with other viruses. If result is PRESUMPTIVE POSTIVE SARS-CoV-2 nucleic acids MAY BE PRESENT.   A presumptive positive result was obtained on the submitted specimen  and confirmed on repeat testing.  While 2019 novel coronavirus  (SARS-CoV-2) nucleic acids may be present in the submitted sample  additional confirmatory testing may be necessary for epidemiological  and / or clinical management purposes  to differentiate between  SARS-CoV-2 and other Sarbecovirus currently known to infect humans.  If clinically indicated additional testing with an alternate test  methodology [phone]([id number]) is  advised. The SARS-CoV-2 RNA is generally  detectable in upper and lower respiratory sp ecimens during the acute  phase of infection. The expected result is Negative. Fact Sheet for Patients:  BoilerBrush.com.cyhttps://www.fda.gov/media/136312/download Fact Sheet for Healthcare Providers: https://pope.com/https://www.fda.gov/media/136313/download This test is not yet approved or cleared by the [place] FDA and has been authorized for detection and/or diagnosis of SARS-CoV-2 by FDA under an Emergency Use Authorization (EUA).  This EUA will remain in effect (meaning this test can be used) for the duration of the COVID-19 declaration under Section 564(b)(1) of the Act, 21 U.S.C. section 360bbb-3(b)(1), unless the authorization is terminated or revoked sooner. Performed at [facility], [address] [place], [place] [address]      Labs: Basic Metabolic Panel: Recent Labs  Lab 01/09/19 0830 01/10/19 0610 01/11/19 0558  NA 140 141 142  K 3.5 3.4* 3.7  CL 107 105 103  CO2 25 28 28   GLUCOSE 96 98 91  BUN 13 13 16   CREATININE 0.98 1.04 1.14  CALCIUM 8.3* 8.2* 8.9  MG  --   --  2.1   CBC: Recent Labs  Lab 01/09/19 0830 01/11/19 0558  WBC 4.3 3.9*  HGB 11.7* 13.7  HCT 37.3* 44.4  MCV 89.2 89.2  PLT 212 249    BNP (last 3 results) Recent Labs    01/02/19 0459 01/03/19 0315 01/09/19 0830  BNP 182.0* 120.0* 408.0*    Signed:  Vassie Lollarlos Berton Butrick MD.  [facility] 01/11/2019, 3:35 PM

## 2019-01-11 NOTE — Care Management Important Message (Signed)
Important Message  Patient Details  Name: [name] MRN: [id number] Date of Birth: Oct 08, 1965   Medicare Important Message Given:  Yes     Tommy Medal 01/11/2019, 1:43 PM

## 2019-01-11 NOTE — Patient Outreach (Signed)
[facility]) Care Management  01/11/2019  [name] July 18, 1965 [id number]   Attempted to call patient. Reviewed his chart and discovered he is currently hospitalized for SOB and lower leg edema.   Plan: Call patient post discharge.   Elayne Guerin, PharmD, [place] Clinical Pharmacist [phone]

## 2019-01-11 NOTE — TOC Initial Note (Signed)
Transition of Care [facility]) - Initial/Assessment Note    Patient Details  Name: [name] MRN: [id number] Date of Birth: [birth year]  Transition of Care [facility]) CM/SW Contact:    Annice Needy, LCSW Phone Number: 01/11/2019, 12:00 PM  Clinical Narrative:                 Patient is well known to TOC due to repeated admissions. Patient is active with [facility] and [facility].  Patient has history of low compliance with medications, diet, and overall functioning. TOC will follow and address needs as they arise.  Expected Discharge Plan: Home/Self Care Barriers to Discharge: No Barriers Identified   Patient Goals and CMS Choice Patient states their goals for this hospitalization and ongoing recovery are:: To return home      Expected Discharge Plan and Services Expected Discharge Plan: Home/Self Care         Expected Discharge Date: 01/11/19                                    Prior Living Arrangements/Services   Lives with:: Parents Patient language and need for interpreter reviewed:: Yes Do you feel safe going back to the place where you live?: Yes      Need for Family Participation in Patient Care: Yes (Comment) Care giver support system in place?: Yes (comment) Current home services: DME Criminal Activity/Legal Involvement Pertinent to Current Situation/Hospitalization: No - Comment as needed  Activities of Daily Living Home Assistive Devices/Equipment: None ADL Screening (condition at time of admission) Patient's cognitive ability adequate to safely complete daily activities?: Yes Is the patient deaf or have difficulty hearing?: No Does the patient have difficulty seeing, even when wearing glasses/contacts?: No Does the patient have difficulty concentrating, remembering, or making decisions?: No Patient able to express need for assistance with ADLs?: Yes Does the patient have difficulty dressing or bathing?: No Independently performs ADLs?: Yes (appropriate  for developmental age) Does the patient have difficulty walking or climbing stairs?: No Weakness of Legs: None Weakness of Arms/Hands: None  Permission Sought/Granted Permission sought to share information with : PCP                Emotional Assessment Appearance:: Appears stated age     Orientation: : Oriented to Self, Oriented to Place, Oriented to  Time, Oriented to Situation Alcohol / Substance Use: Not Applicable Psych Involvement: No (comment)  Admission diagnosis:  Anemia, unspecified type [D64.9] Acute on chronic congestive heart failure, unspecified heart failure type [facility]) [I50.9] Patient Active Problem List   Diagnosis Date Noted  . Cellulitis of right leg   . Acute diastolic CHF (congestive heart failure) (HCC) 12/15/2018  . Cellulitis 12/08/2018  . Chronic obstructive pulmonary disease (HCC)   . Benign prostatic hyperplasia without lower urinary tract symptoms   . Acute on chronic combined systolic and diastolic CHF (congestive heart failure) (HCC) 10/02/2018  . AKI (acute kidney injury) (HCC) 09/18/2018  . Acute on chronic combined systolic and diastolic congestive heart failure (HCC) 08/15/2018  . SOB (shortness of breath) 08/03/2018  . Volume overload 07/22/2018  . Acute systolic CHF (congestive heart failure) (HCC) 07/04/2018  . Acute on chronic combined systolic and diastolic heart failure (HCC) 04/01/2018  . Hypomagnesemia 04/01/2018  . Acute on chronic diastolic CHF (congestive heart failure) (HCC) 03/11/2018  . Pressure injury of skin 03/11/2018  . CHF (congestive heart failure) (HCC) 09/08/2017  .  Altered mental status 05/11/2017  . Acute exacerbation of CHF (congestive heart failure) ([place]) 04/20/2017  . Acute on chronic systolic (congestive) heart failure ([place]) 04/19/2017  . CHF exacerbation ([place]) 04/11/2017  . Chronic respiratory failure with hypoxia ([place]) 04/11/2017  . Encounter for hospice care discussion   . Palliative care encounter   .  Goals of care, counseling/discussion   . DNR (do not resuscitate)   . DNR (do not resuscitate) discussion   . Chronic congestive heart failure ([place]) 02/18/2017  . Acute systolic heart failure ([place]) 01/18/2017  . Pedal edema 12/02/2016  . Acute CHF (congestive heart failure) ([place]) 11/09/2016  . Acute on chronic systolic CHF (congestive heart failure) ([place]) 08/19/2016  . Chronic renal failure, stage 2 (mild) 05/17/2016  . Coronary arteries, normal Dec 2016 01/23/2016  . Chronic anticoagulation-Xarelto 01/23/2016  . NSVT (nonsustained ventricular tachycardia) ([place]) 01/23/2016  . Elevated troponin 12/21/2015  . Nonischemic cardiomyopathy ([place])   . Obesity, Class III, BMI 40-49.9 (morbid obesity) ([place])   . Noncompliance with diet and medication regimen 12/02/2015  . OSA (obstructive sleep apnea) 09/20/2015  . Pulmonary hypertension ([place]) 09/19/2015  . Normocytic anemia 09/19/2015  . Atrial fibrillation, chronic   . Dyspnea on exertion 05/28/2015  . Acute respiratory failure with hypoxia ([place]) 04/01/2015  . Hypokalemia 04/01/2015  . Obesity hypoventilation syndrome ([place]) 08/08/2014  . GERD (gastroesophageal reflux disease) 08/08/2014  . Edema of right lower extremity 06/21/2014  . Fecal urgency 06/21/2014  . Asthma 02/11/2009  . Hyperlipidemia 07/12/2008  . OBESITY, MORBID 07/12/2008  . Anxiety state 07/12/2008  . DEPRESSION 07/12/2008  . Essential hypertension 07/12/2008  . ALLERGIC RHINITIS 07/12/2008  . Peptic ulcer 07/12/2008  . OSTEOARTHRITIS 07/12/2008   PCP:  Jani Gravel, MD Pharmacy:   [id number] [place], [place] [address] [place] [address] Phone: [phone] Fax: [phone]     Social Determinants of Health (SDOH) Interventions    Readmission Risk Interventions Readmission Risk Prevention Plan 12/16/2018 11/26/2018 11/25/2018  Transportation Screening Complete Complete Complete  Medication Review  (RN Care Manager) Complete Complete Complete  PCP or Specialist appointment within 3-5 days of discharge - Not Complete Not Complete  HRI or Home Care Consult Complete Complete Complete  [place] or Home Care Consult Pt Refusal Comments - - -  SW Recovery Care/Counseling Consult Complete Complete Complete  Palliative Care Screening Not Applicable Not Applicable Not [place] Not Applicable Not Applicable Not Applicable  Some recent data might be hidden

## 2019-01-12 ENCOUNTER — Other Ambulatory Visit: Payer: Self-pay

## 2019-01-12 NOTE — Patient Outreach (Signed)
[facility]) Care Management  01/12/2019  [name] 1965/11/25 [id number]    Follow-up outreach with [name]. He was recently hospitalized for CHF complications. He was discharged on yesterday. Today, he denies complaints of shortness of breath. Denies chest pain or palpitations. Reports minimal edema to his lower extremities and small amounts of drainage to his right leg. He reports applying topical antibiotic as prescribed. Pending follow-up with [facility].  Weight-352lbs  During our conversation last week, [name] stated that he did not intend to take oral furosemide. He felt that it didn't work as well, and made his legs swell and drain. Per the discharge instructions on 01/11/19, he is to take furosemide 120mg  twice a day. Encouraged [name] to take the full dose twice as day as prescribed. MD can be notified if he prefers to switch back to bumetanide. He took one dose of furosemide since discharge.  He agreed to attempt to take his medications as prescribed for the next week.  Discussed his plan for CHF self-management. During our conversation last week, he was very clear about his intentions to utilize the ED until he was admitted. Indicated that he was upset with his insurance company's decision to not cover brand medications. Strongly encouraged [name] to consider utilizing the available resources. There are numerous programs and services available. He agreed to consider some of the services.  Current Outpatient Medications on File Prior to Visit  Medication Sig Dispense Refill  . acetaminophen (TYLENOL) 325 MG tablet Take 2 tablets (650 mg total) by mouth every 4 (four) hours as needed for headache or mild pain.    Marland Kitchen albuterol (PROVENTIL) (2.5 MG/3ML) 0.083% nebulizer solution Take 2.5 mg by nebulization every 6 (six) hours as needed for wheezing or shortness of breath.    Marland Kitchen atorvastatin (LIPITOR) 40 MG tablet Take 40 mg by mouth daily.     .  carvedilol (COREG) 25 MG tablet Take 1 tablet (25 mg total) by mouth 2 (two) times daily with a meal for 30 days. 60 tablet 2  . docusate sodium (COLACE) 100 MG capsule Take 1 capsule (100 mg total) by mouth 2 (two) times daily. (Patient taking differently: Take 100 mg by mouth every morning. ) 10 capsule 0  . ferrous gluconate (FERGON) 324 MG tablet Take 1 tablet (324 mg total) by mouth 2 (two) times daily with a meal. (Patient taking differently: Take 324 mg by mouth daily. ) 60 tablet 0  . fluticasone (FLOVENT HFA) 220 MCG/ACT inhaler Inhale 2 puffs into the lungs daily as needed (for shortness of breath).     . furosemide (LASIX) 80 MG tablet Take 1.5 tablets (120 mg total) by mouth 2 (two) times daily. 90 tablet 3  . lisinopril (ZESTRIL) 10 MG tablet Take 20 mg by mouth daily.     Marland Kitchen oxyCODONE-acetaminophen (PERCOCET/ROXICET) 5-325 MG tablet Take 1 tablet by mouth every 6 (six) hours as needed. 20 tablet 0  . pantoprazole (PROTONIX) 40 MG tablet Take 1 tablet (40 mg total) by mouth daily at 6 (six) AM. 30 tablet 0  . potassium chloride SA (K-DUR) 20 MEQ tablet Take 2 tablets (40 mEq total) by mouth daily.    . rivaroxaban (XARELTO) 20 MG TABS tablet Take 1 tablet (20 mg total) by mouth daily with breakfast. (Patient taking differently: Take 20 mg by mouth daily with supper. ) 30 tablet 0  . silver sulfADIAZINE (SILVADENE) 1 % cream Apply 1 application topically daily. 400 g  0  . spironolactone (ALDACTONE) 100 MG tablet Take 0.5 tablets (50 mg total) by mouth daily. For fluid 60 tablet 3  . tamsulosin (FLOMAX) 0.4 MG CAPS capsule Take 1 capsule (0.4 mg total) by mouth daily. 30 capsule 0  . [DISCONTINUED] potassium chloride 20 MEQ TBCR Take 20 mEq by mouth 2 (two) times daily. 10 tablet 0   No current facility-administered medications on file prior to visit.     PLAN -Will follow-up next week.   Katha Cabal [facility] Community Care Manager [phone]

## 2019-01-14 IMAGING — DX DG CHEST 2V
2 series · 2 of 2 positions shown · non-contrast
Comparison: Chest radiograph dated 04/21/2017 and 04/19/2017

CLINICAL DATA: 51-year-old male with chest pain and shortness of
breath.

EXAM:
CHEST  2 VIEW

[chest pa]
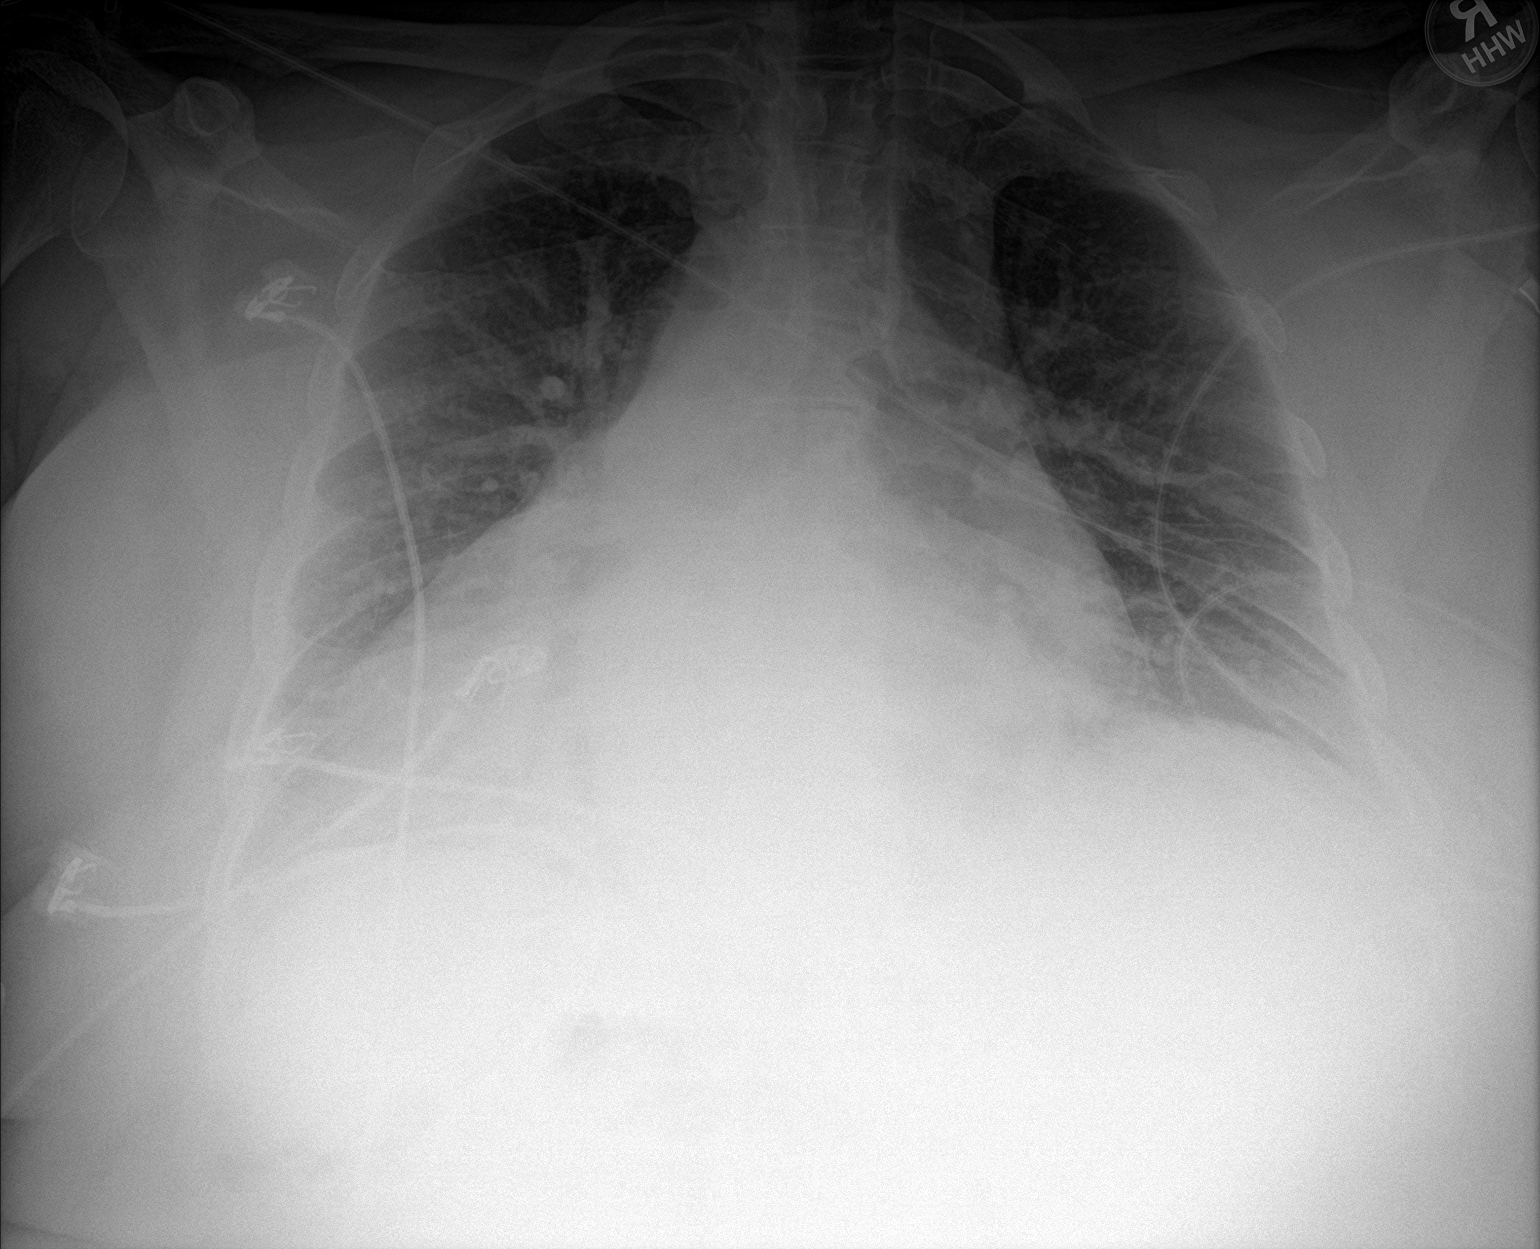

[chest lat]
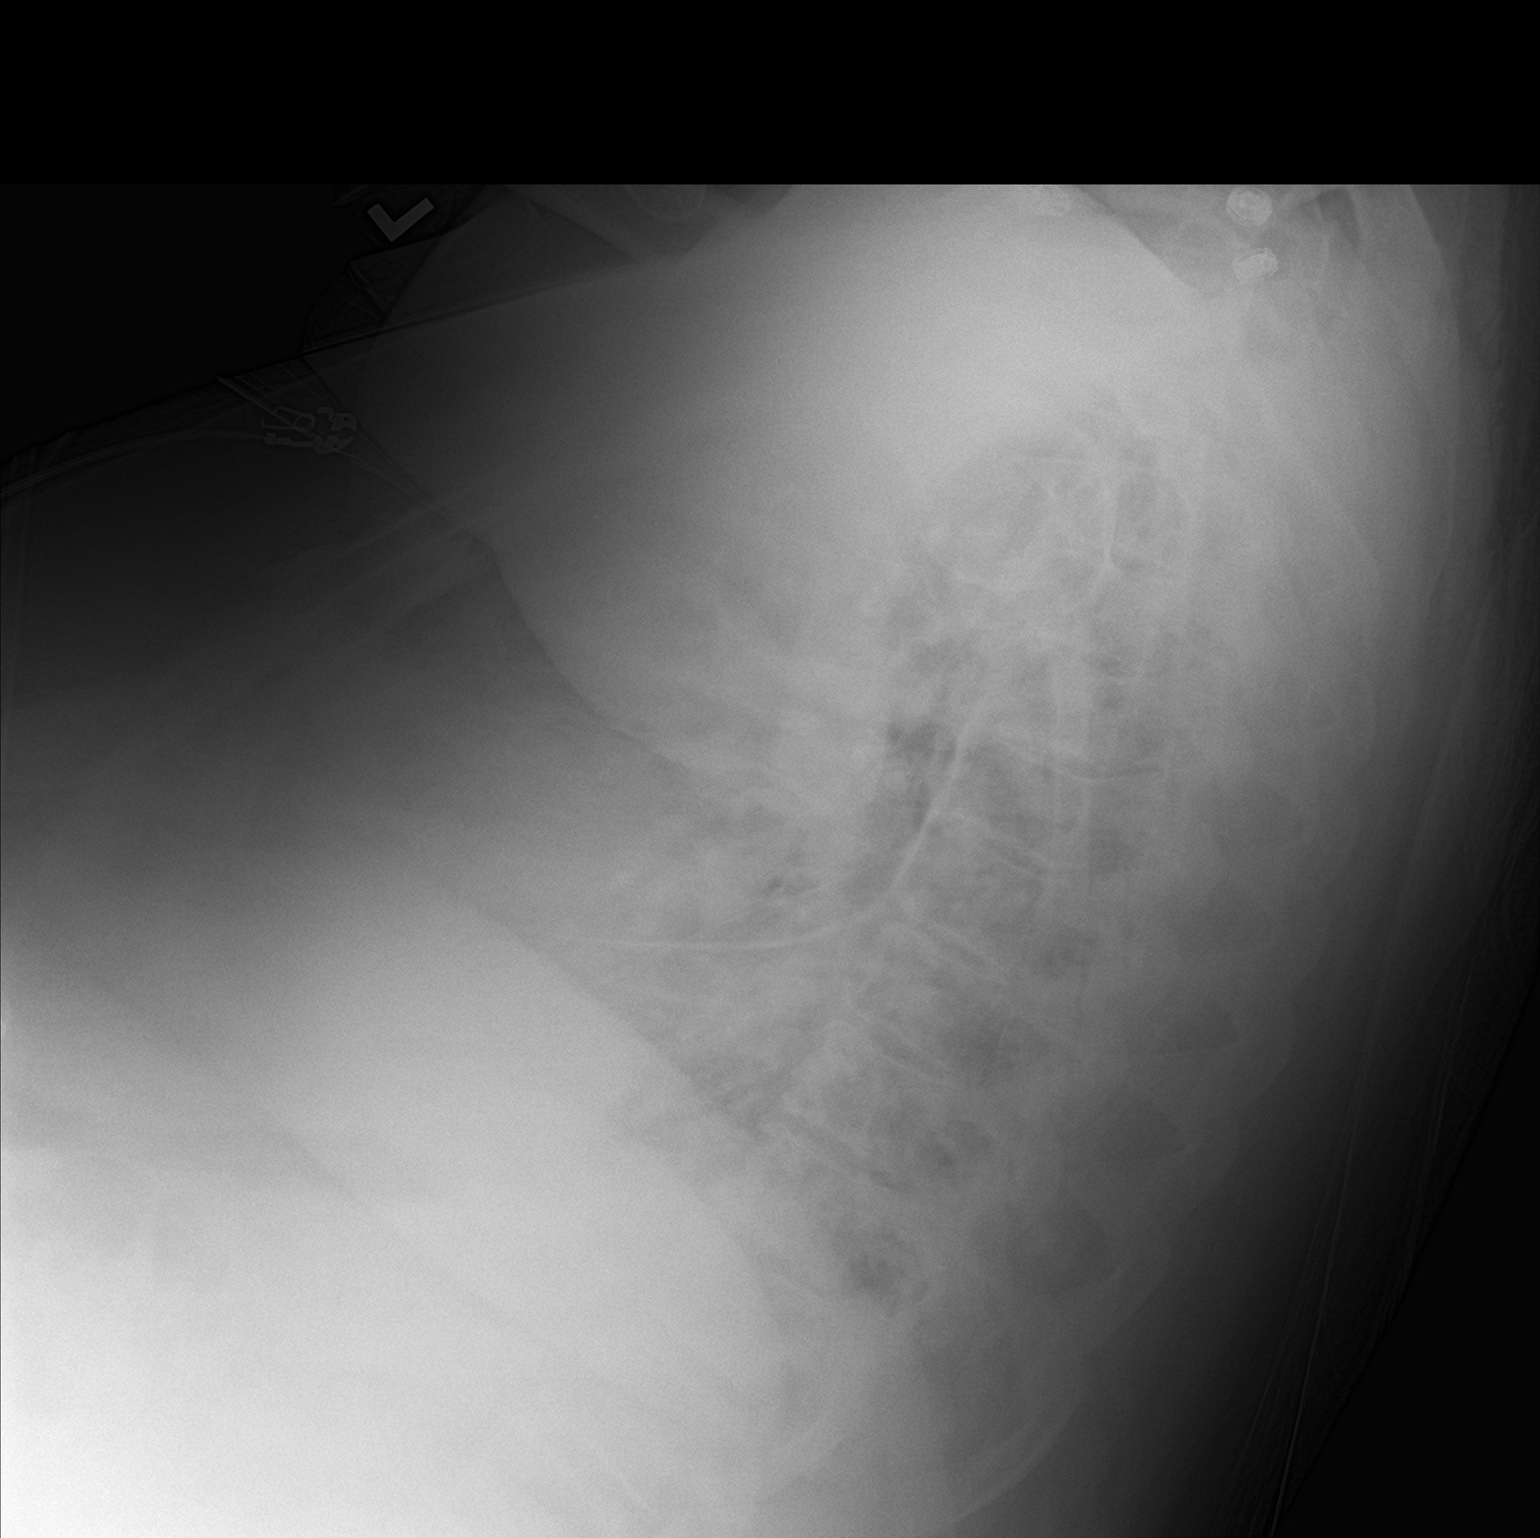

[2 of 2 positions shown; findings below may reference images not displayed]

FINDINGS: There is moderate cardiomegaly with pulmonary vascular congestion.
No focal consolidation, pleural effusion, or pneumothorax. No acute
osseous pathology.
IMPRESSION: No significant interval change in the moderate cardiomegaly and
vascular congestion since the prior radiographs.

## 2019-01-16 ENCOUNTER — Other Ambulatory Visit: Payer: Self-pay | Admitting: Pharmacist

## 2019-01-16 ENCOUNTER — Ambulatory Visit: Payer: Self-pay | Admitting: Pharmacist

## 2019-01-16 IMAGING — DX DG CHEST 2V
2 series · 2 of 2 positions shown · non-contrast
Comparison: Chest radiograph dated 05/09/2017

CLINICAL DATA: 51-year-old male with shortness of breath.

EXAM:
CHEST  2 VIEW

[chest lat]
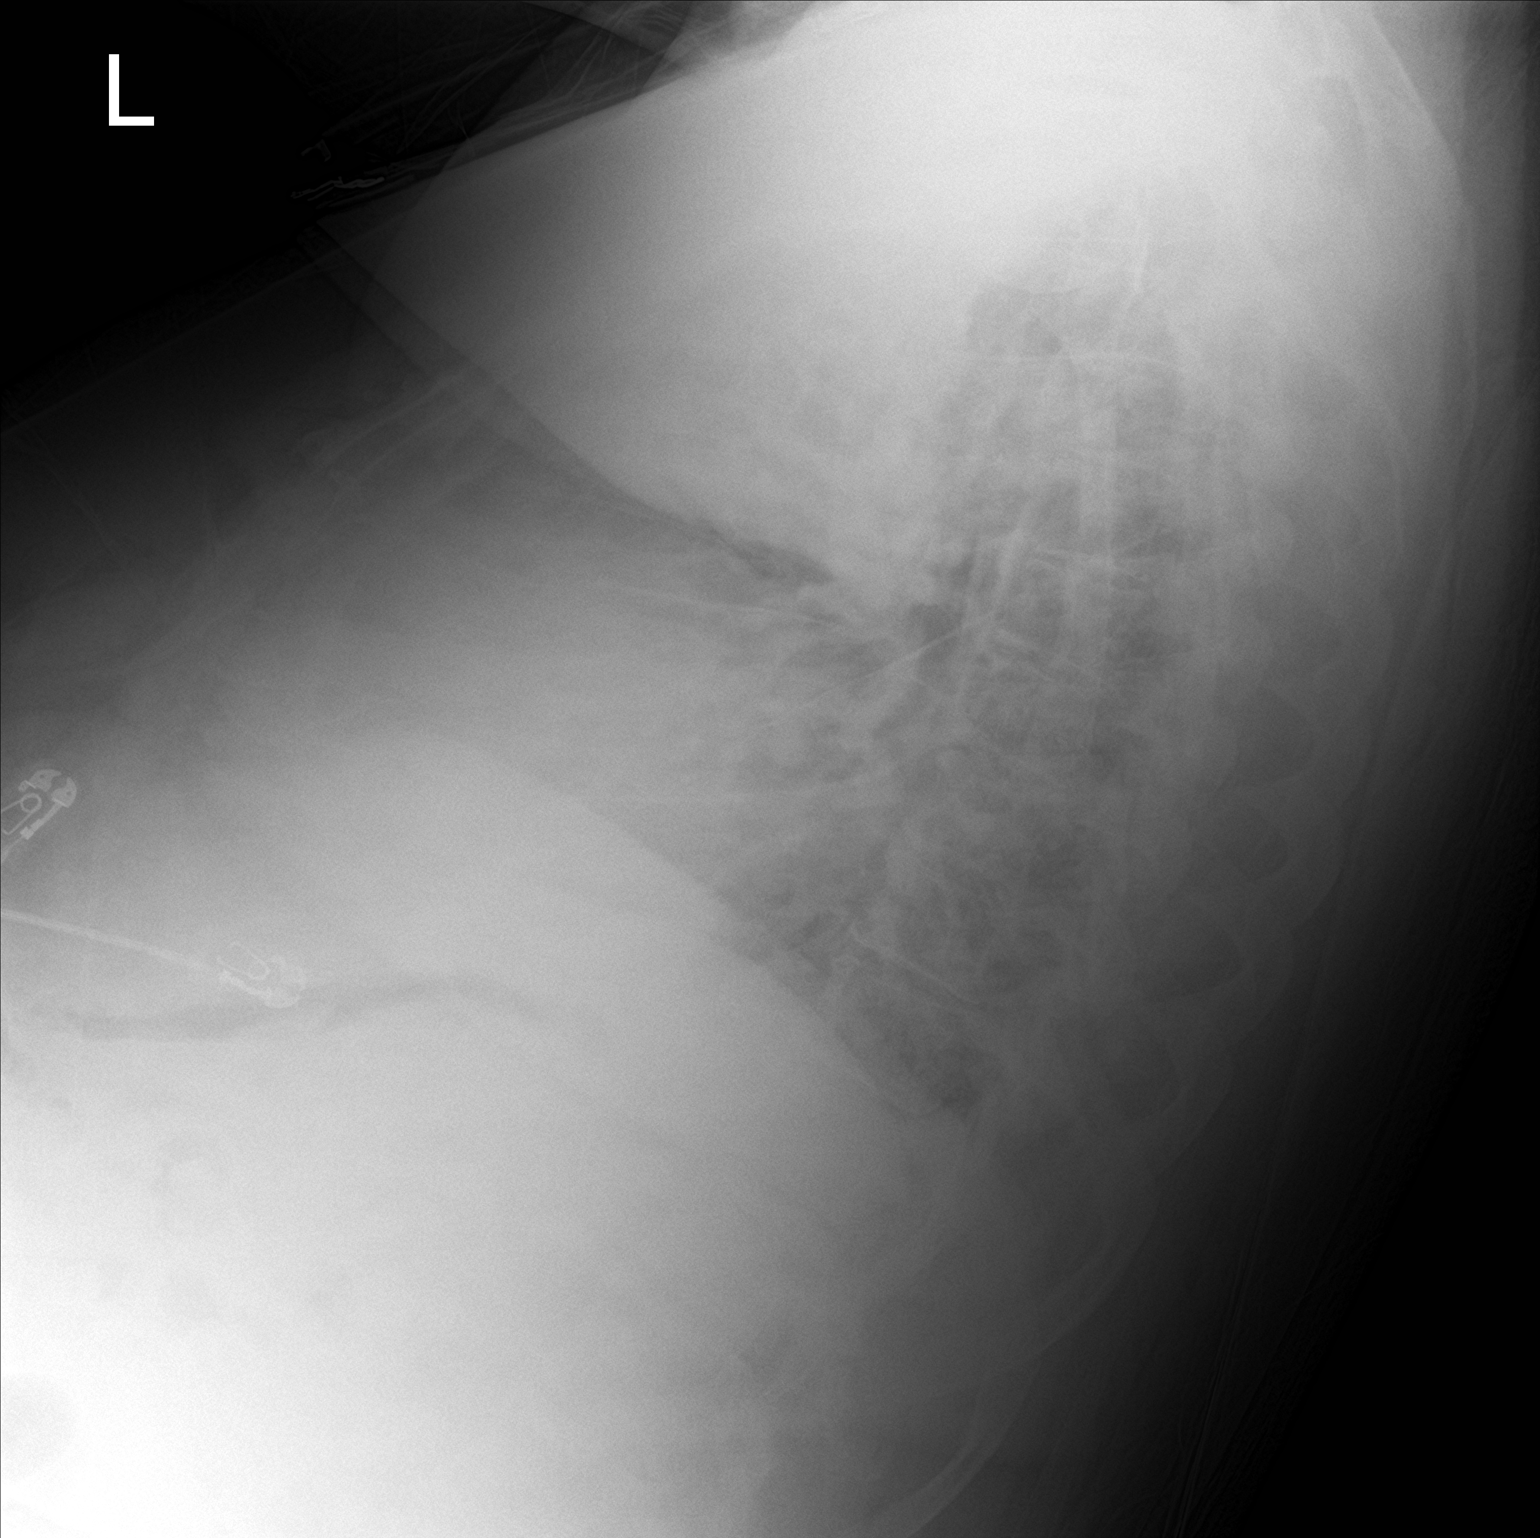

[chest ap]
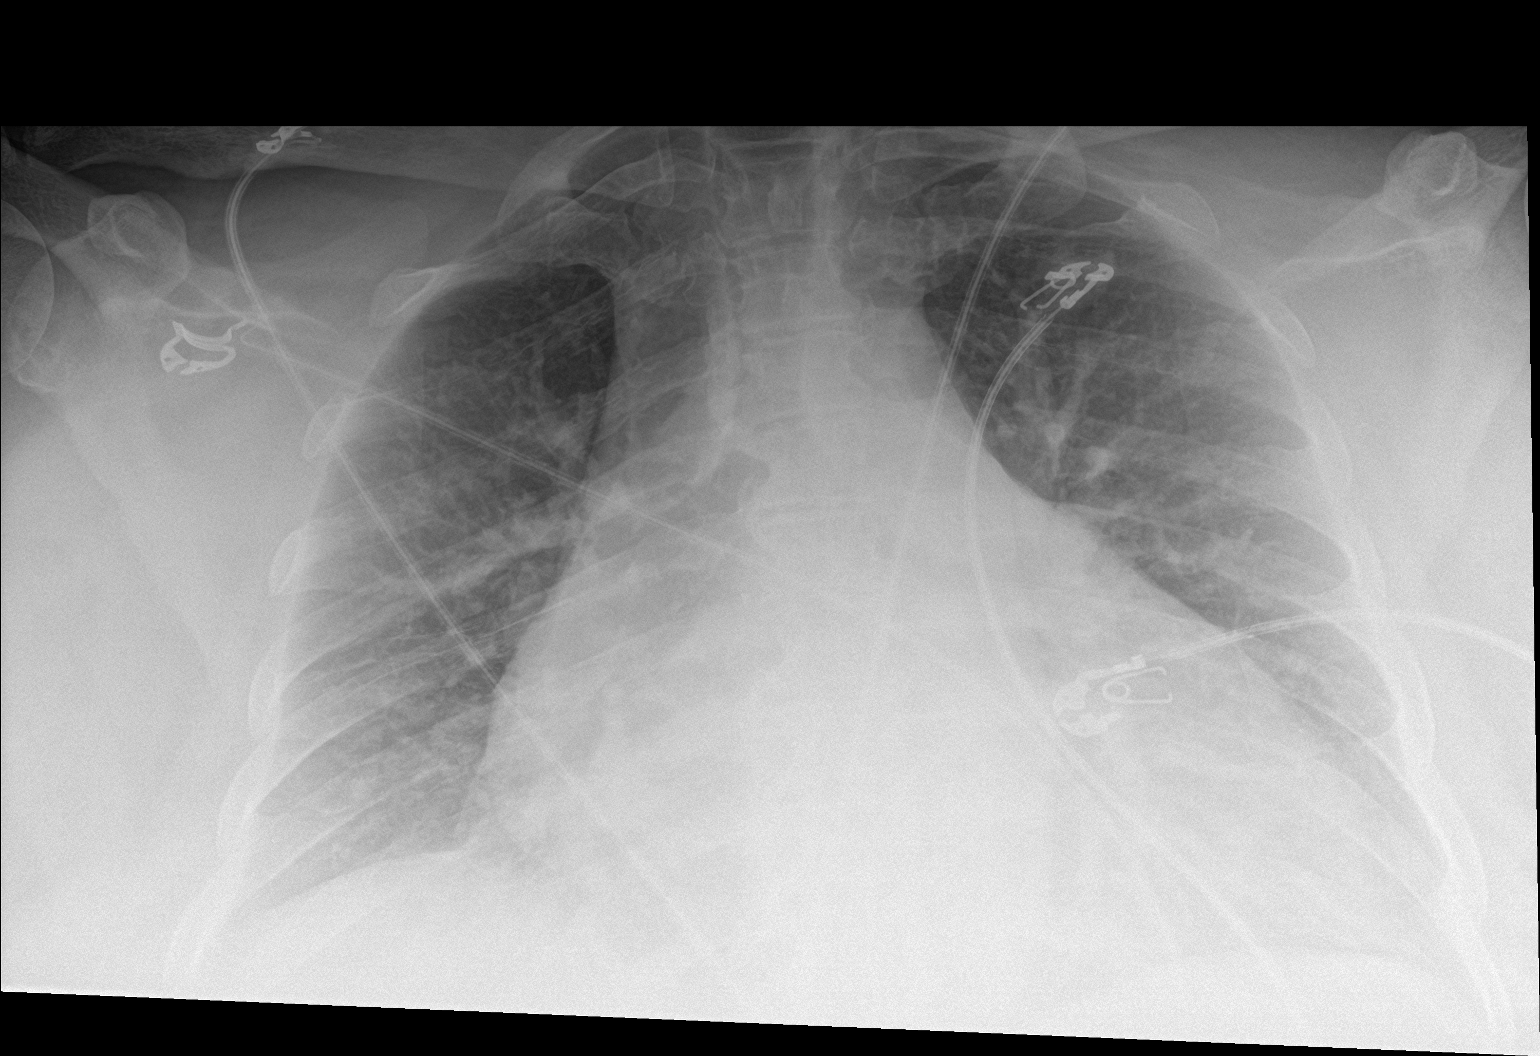

[2 of 2 positions shown; findings below may reference images not displayed]

FINDINGS: There is stable cardiomegaly with mild vascular congestion. No focal
consolidation, pleural effusion, or pneumothorax. No acute osseous
pathology.
IMPRESSION: Cardiomegaly with mild vascular congestion similar to prior
radiograph. Clinical correlation and follow-up recommended.

## 2019-01-16 IMAGING — CT CT HEAD W/O CM
3 series · 15 of 47 positions shown, 18 images · non-contrast
Comparison: 01/17/2016

CLINICAL DATA: Generalize weakness.

EXAM:
CT HEAD WITHOUT CONTRAST
TECHNIQUE: Contiguous axial images were obtained from the base of the skull
through the vertex without intravenous contrast.

[Series 2: head trauma wo · axial · 0.46mm/px · z∈[+1359,+1484]mm · 9 of 31 slices shown, 12 images]
[im 3/31  brain]
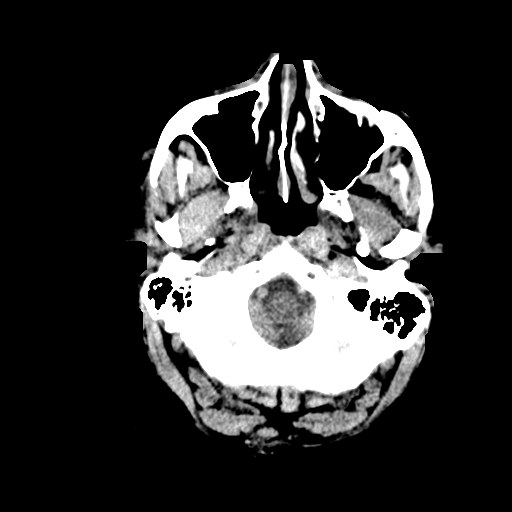
[im 3/31  bone]
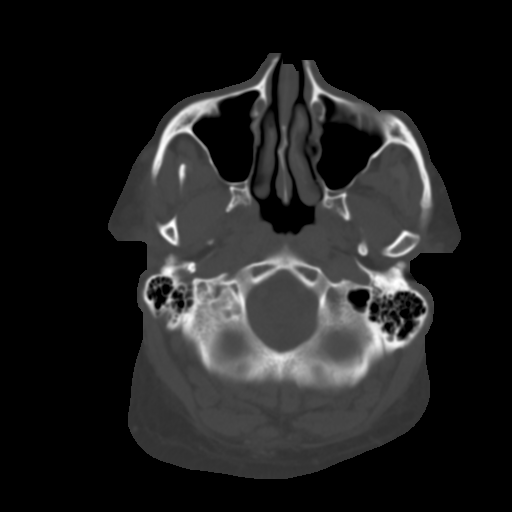
[im 6/31  brain]
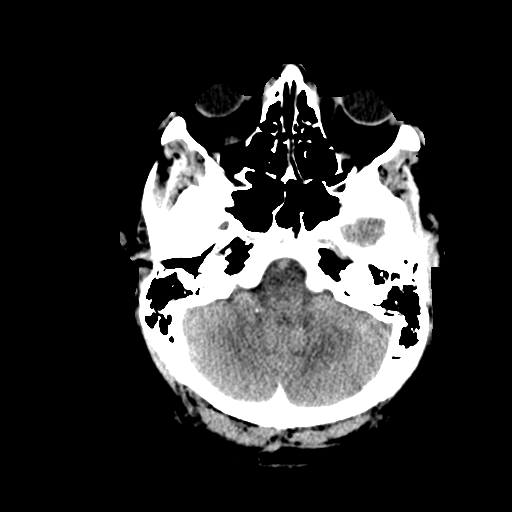
[im 9/31  brain]
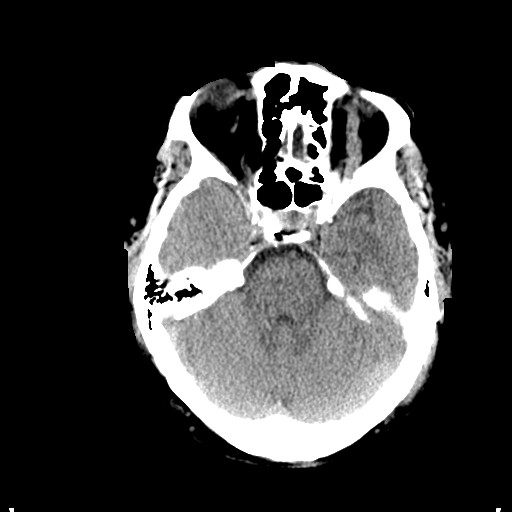
[im 12/31  brain]
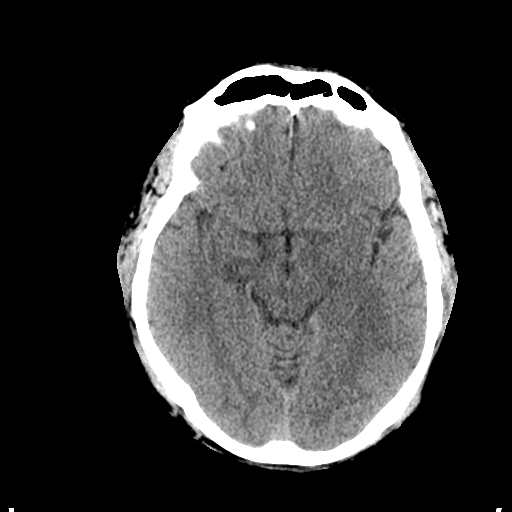
[im 16/31  brain]
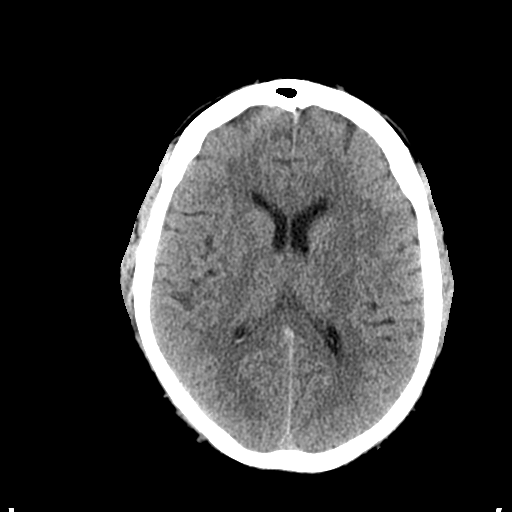
[im 16/31  bone]
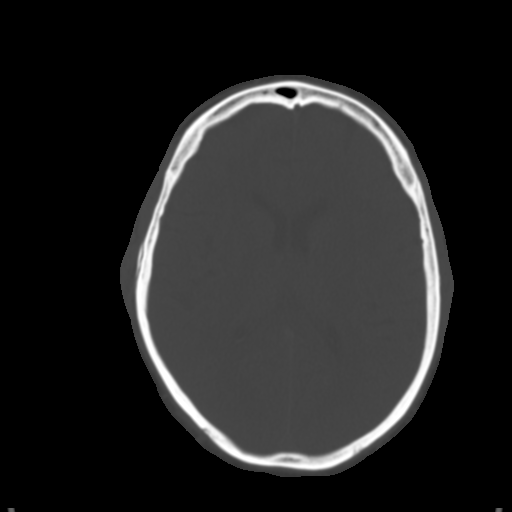
[im 19/31  brain]
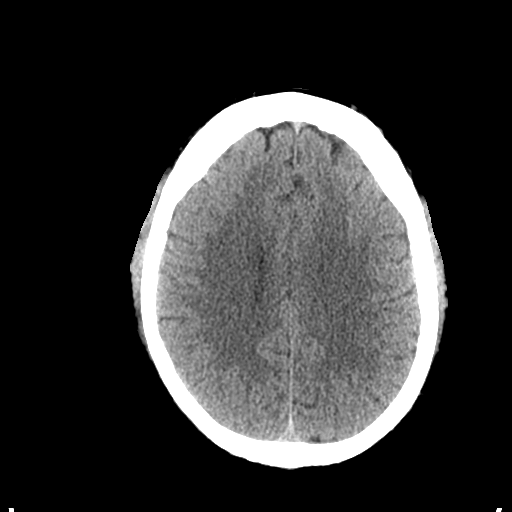
[im 22/31  brain]
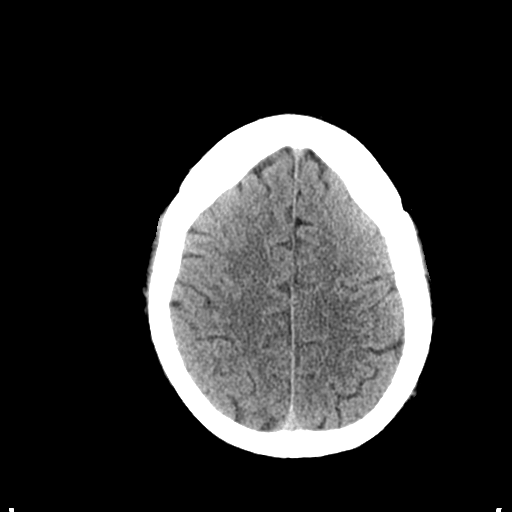
[im 25/31  brain]
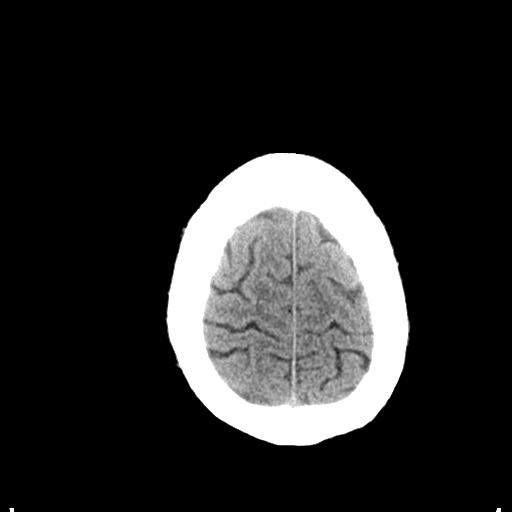
[im 28/31  brain]
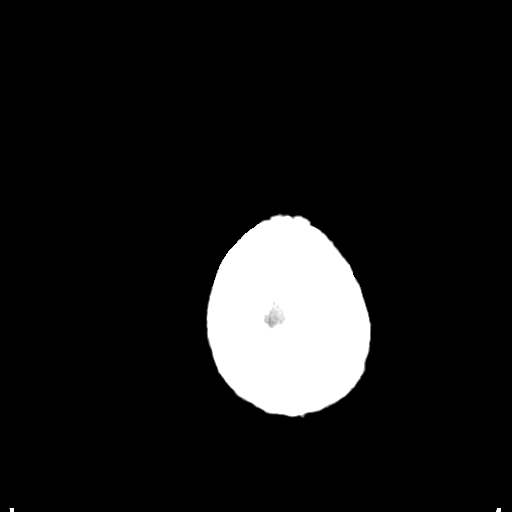
[im 28/31  bone]
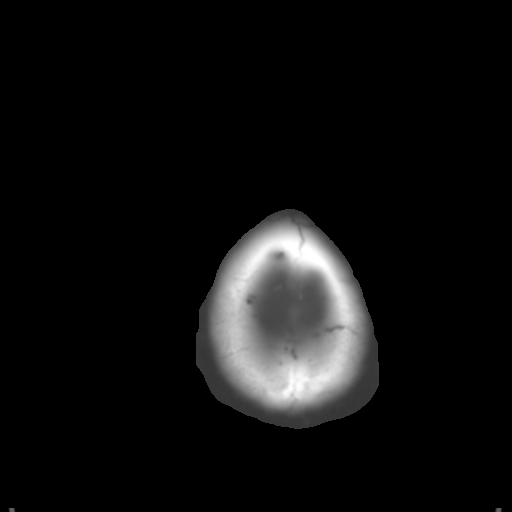

[Series 4: coronal soft tissue · coronal · 0.31mm/px · 3 of 70 slices shown]
[im 24/70  brain]
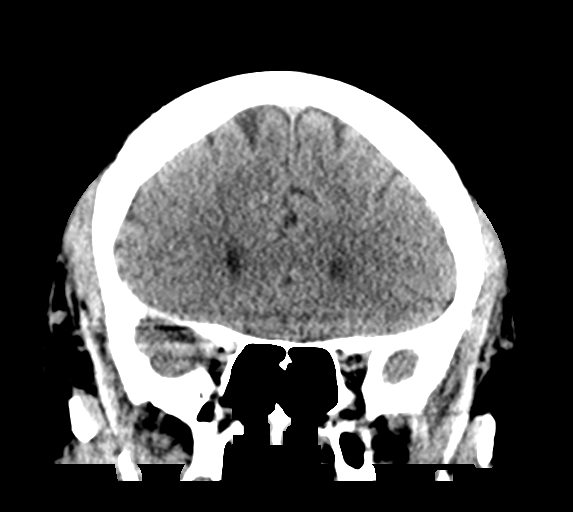
[im 31/70  brain]
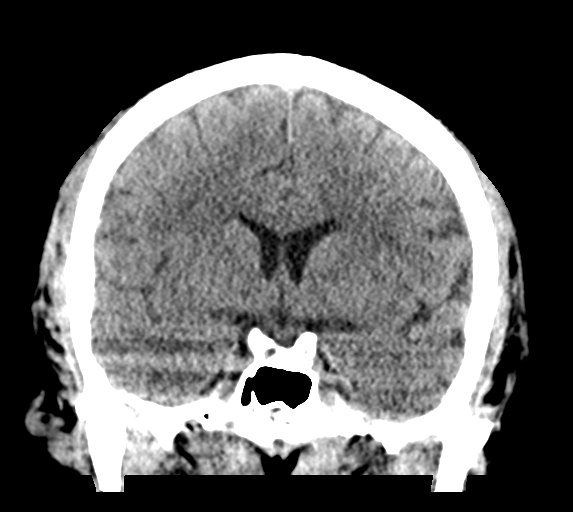
[im 39/70  brain]
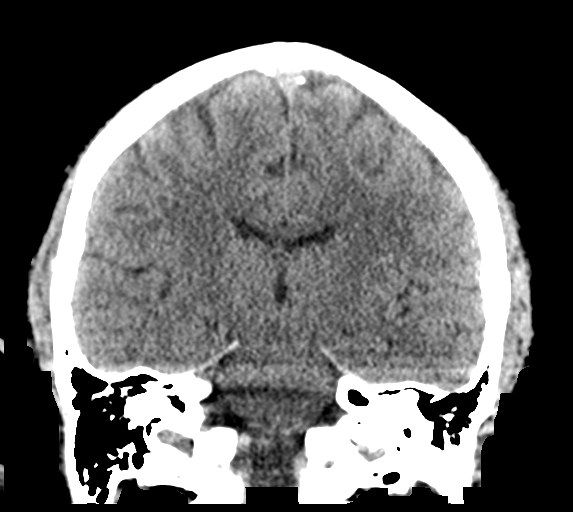

[Series 5: sagittal soft tissue · sagittal · 0.30mm/px · 3 of 57 slices shown]
[im 19/57  brain]
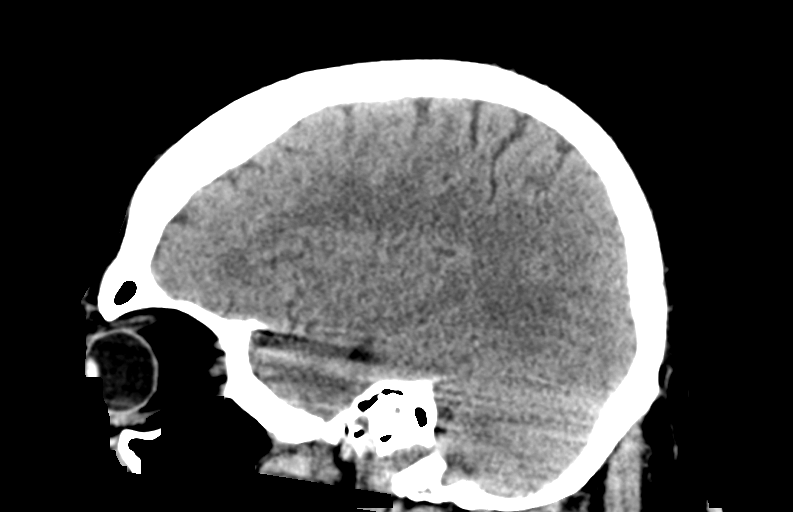
[im 29/57  brain]
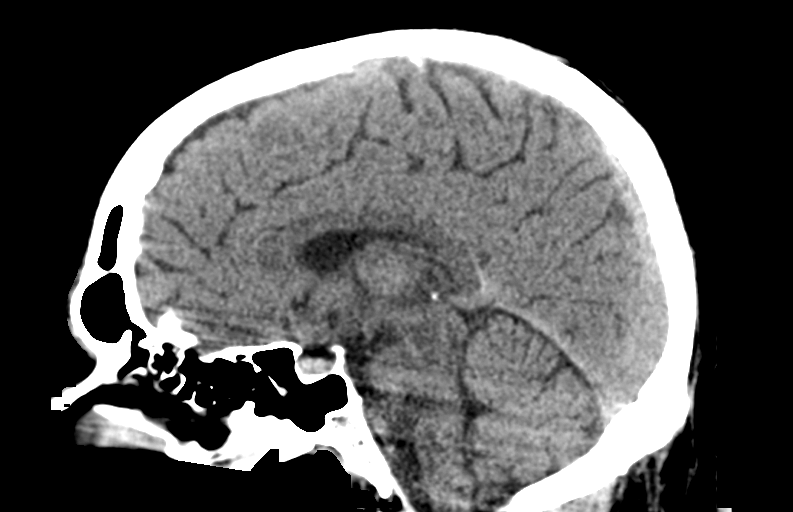
[im 38/57  brain]
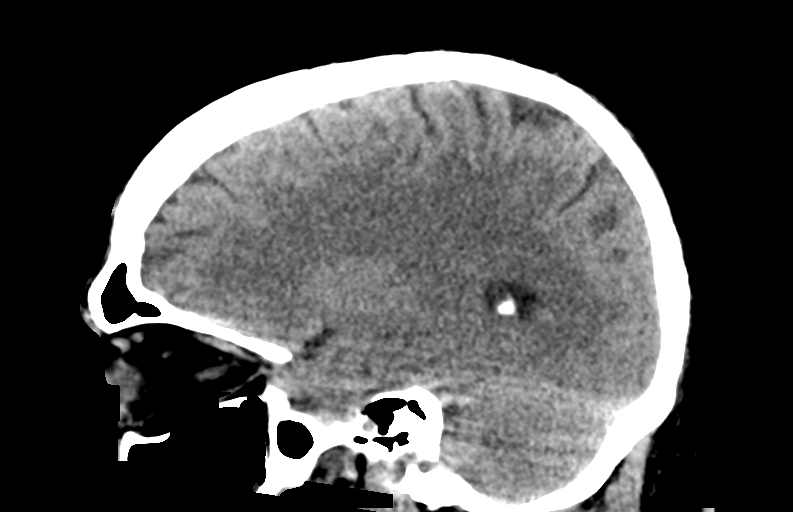

[15 of 47 positions shown; findings below may reference images not displayed]

FINDINGS: Brain: No evidence of acute infarction, hemorrhage, hydrocephalus,
extra-axial collection or mass lesion/mass effect.

Vascular: No hyperdense vessel or unexpected calcification.

Skull: Normal. Negative for fracture or focal lesion.

Sinuses/Orbits: Retention cysts in the left maxillary antrum.
Paranasal sinuses and mastoid air cells are otherwise clear.

Other: None.
IMPRESSION: No acute intracranial abnormalities.

## 2019-01-16 NOTE — Patient Outreach (Signed)
[facility]) Care Management  01/16/2019  [name] 01-30-1966 [id number]   Patient was called to follow up post discharge. Unfortunately, he did not answer the phone and a message could not be left as a voicemail did not pick up. The phone rang >20 times.  Plan: Call patient back in 3-4 business days.   Elayne Guerin, PharmD, [place] Clinical Pharmacist [phone]

## 2019-01-18 DIAGNOSIS — Z9119 Patient's noncompliance with other medical treatment and regimen: Secondary | ICD-10-CM | POA: Diagnosis not present

## 2019-01-18 DIAGNOSIS — Z7901 Long term (current) use of anticoagulants: Secondary | ICD-10-CM | POA: Diagnosis not present

## 2019-01-18 DIAGNOSIS — I5042 Chronic combined systolic (congestive) and diastolic (congestive) heart failure: Secondary | ICD-10-CM | POA: Diagnosis not present

## 2019-01-18 DIAGNOSIS — I10 Essential (primary) hypertension: Secondary | ICD-10-CM | POA: Diagnosis not present

## 2019-01-18 DIAGNOSIS — I480 Paroxysmal atrial fibrillation: Secondary | ICD-10-CM | POA: Diagnosis not present

## 2019-01-19 ENCOUNTER — Ambulatory Visit: Payer: Self-pay

## 2019-01-20 ENCOUNTER — Other Ambulatory Visit: Payer: Self-pay

## 2019-01-20 ENCOUNTER — Other Ambulatory Visit: Payer: Self-pay | Admitting: Pharmacist

## 2019-01-20 NOTE — Patient Outreach (Signed)
[name]) Care Management  01/20/2019  GAJE TENNYSON 05-05-1966 [id number]    Unsuccessful outreach attempt. Will attempt outreach with Mr. Teters next week.    [place] [phone]

## 2019-01-21 NOTE — Patient Outreach (Signed)
[place] [facility]) Care Management  [place]   01/21/2019  [name] 10-Oct-1965 [id number]  Reason for referral: Medication Reconciliation Post Discharge  Referral source: [facility] RN Current insurance: [facility]  Outreach:  Successful telephone call with patient.  HIPAA identifiers verified.   Subjective:  53 y.o. male with medical history significant of CHF (numerous admissions for uncompensated CHF due to medication non-compliance and dietary indiscretions,  atrial fibrillation, HTN, MI, asthma, and OSA. He was hospitalized last week after presenting with progressive leg swelling and SOB.  Does the patient ever forget to take medication?  yes Does the patient have problems obtaining medications due to transportation?   no Does the patient have problems obtaining medications due to cost?  no  Does the patient feel that medications prescribed are effective?  no Does the patient ever experience any side effects to the medications prescribed?  no  Does the patient measure his/her own blood glucose at home?  No Does the patient measure his/her own blood pressure at home? No   Objective: The ASCVD Risk score [name], et al., 2013) failed to calculate for the following reasons:   Cannot find a previous HDL lab   Cannot find a previous total cholesterol lab  Lab Results  Component Value Date   CREATININE 1.14 01/11/2019   CREATININE 1.04 01/10/2019   CREATININE 0.98 01/09/2019    Lab Results  Component Value Date   HGBA1C 5.7 (H) 04/04/2016    Lipid Panel     Component Value Date/Time   CHOL 139 06/14/2015 0328   TRIG 58 06/14/2015 0328   HDL 28 (L) 06/14/2015 0328   CHOLHDL 5.0 06/14/2015 0328   VLDL 12 06/14/2015 0328   LDLCALC 99 06/14/2015 0328    BP Readings from Last 3 Encounters:  01/11/19 120/71  01/03/19 (!) 148/84  01/02/19 (!) 155/104    Allergies  Allergen Reactions  . Banana Shortness Of Breath  . Bee Venom  Shortness Of Breath, Swelling and Other (See Comments)    Reaction:  Facial swelling  . Strawberry Flavor Shortness Of Breath and Rash  . Aspirin Other (See Comments)    Reaction:  GI upset   . Metolazone Other (See Comments)    Pt states that he stopped taking this med due to heart attack like symptoms.   . Oatmeal Nausea And Vomiting  . Orange Juice [Orange Oil] Nausea And Vomiting    All acidic products make him nauseous and upset stomach  . Torsemide Swelling and Other (See Comments)    Reaction:  Swelling of feet/legs   . Diltiazem Palpitations  . Hydralazine Palpitations  . Lipitor [Atorvastatin] Other (See Comments)    Reaction:  Nose bleeds : Patient is currently taking this medication    Medications Reviewed Today    Reviewed by Elayne Guerin, [facility] (Pharmacist) on 01/21/19 at [place] List Status: <None>  Medication Order Taking? Sig Documenting Provider Last Dose Status Informant  acetaminophen (TYLENOL) 325 MG tablet [id number] Yes Take 2 tablets (650 mg total) by mouth every 4 (four) hours as needed for headache or mild pain. Debbe Odea, MD Taking Active Self  albuterol (PROVENTIL) (2.5 MG/3ML) 0.083% nebulizer solution [id number] Yes Take 2.5 mg by nebulization every 6 (six) hours as needed for wheezing or shortness of breath. [provider] Taking Active Self           Med Note Trinidad Curet, DANA K   Thu Mar 04, 2017  8:53 PM)    atorvastatin (LIPITOR) 40 MG tablet [id number] Yes Take 40 mg by mouth daily.  [provider] Taking Active Self  carvedilol (COREG) 25 MG tablet [id number] Yes Take 1 tablet (25 mg total) by mouth 2 (two) times daily with a meal for 30 days. Sherryll BurgerShah, Pratik D, DO Taking Active Self  docusate sodium (COLACE) 100 MG capsule [id number] Yes Take 1 capsule (100 mg total) by mouth 2 (two) times daily.  Patient taking differently: Take 100 mg by mouth every morning.    Kathlen ModyAkula, Vijaya, MD Taking Active Self  ferrous gluconate (FERGON) 324 MG  tablet [id number] Yes Take 1 tablet (324 mg total) by mouth 2 (two) times daily with a meal.  Patient taking differently: Take 324 mg by mouth daily.    Kathlen ModyAkula, Vijaya, MD Taking Active Self  fluticasone (FLOVENT HFA) 220 MCG/ACT inhaler [id number] Yes Inhale 2 puffs into the lungs daily as needed (for shortness of breath).  [provider] Taking Active Self  furosemide (LASIX) 80 MG tablet [id number] Yes Take 1.5 tablets (120 mg total) by mouth 2 (two) times daily. Vassie LollMadera, Carlos, MD Taking Active Self  lisinopril (ZESTRIL) 10 MG tablet [id number] Yes Take 20 mg by mouth daily.  [provider] Taking Active Self  oxyCODONE-acetaminophen (PERCOCET/ROXICET) 5-325 MG tablet [id number] Yes Take 1 tablet by mouth every 6 (six) hours as needed. Bethann BerkshireZammit, Joseph, MD Taking Active Self           Med Note Sharlene Dory(MARTIN, NICHOLE M   Mon Jan 09, 2019 10:56 AM)    pantoprazole (PROTONIX) 40 MG tablet [id number] Yes Take 1 tablet (40 mg total) by mouth daily at 6 (six) AM. Kathlen ModyAkula, Vijaya, MD Taking Active Self        Discontinued 09/03/15 0754 (Entry Error)   potassium chloride SA (K-DUR) 20 MEQ tablet [id number] Yes Take 2 tablets (40 mEq total) by mouth daily. Vassie LollMadera, Carlos, MD Taking Active Self  rivaroxaban (XARELTO) 20 MG TABS tablet [id number] Yes Take 1 tablet (20 mg total) by mouth daily with breakfast.  Patient taking differently: Take 20 mg by mouth daily with supper.    Kathlen ModyAkula, Vijaya, MD Taking Active Self           Med Note Dorothy Puffer(BAILEY, KATHERINE E   Sun Sep 18, 2018  9:10 AM)    silver sulfADIAZINE (SILVADENE) 1 % cream [id number] Yes Apply 1 application topically daily. Glynn Octaveancour, Stephen, MD Taking Active Self  spironolactone (ALDACTONE) 100 MG tablet [id number] Yes Take 0.5 tablets (50 mg total) by mouth daily. For fluid Vassie LollMadera, Carlos, MD Taking Active Self  tamsulosin [facility](FLOMAX) 0.4 MG CAPS capsule [id number] Yes Take 1 capsule (0.4 mg total) by mouth daily. Kathlen ModyAkula, Vijaya, MD Taking Active Self           Assessment:Drugs sorted by system:  Cardiovascular: Atorvastatin, Furosemide, Carvedilol, Lisinopril, Xarelto, Spironolactone,   Pulmonary/Allergy: Albuterol Nebulizer Solution, Fluticasone HFA, Oxygen,  Gastrointestinal: Docusate Sodium, Pantoprazole,  Pain: Acetaminophen, Oxycodone/APAP  Genitourinary: Tamsulosin  Topical: Silver Sulfadiazine Cream  Vitamins/Minerals/Supplements: Ferrous Gluconate, Potassium Chloride  ASSESSMENT: Date Discharged from Hospital: 01/11/2019 Date Medication Reconciliation Performed: 01/21/2019  Medications:  New at Discharge: . none  Adjustments at Discharge: . none  Discontinued at Discharge:   Doxycycline  Patient was recently discharged from hospital and all medications have been reviewed.  Medication Review Findings:  . Adherence- patient reported he was taking all his medications as prescribed. We reviewed his diuretic therapy and he said he was taking  1.5 Furosemide 80mg  tablets twice daily. Marland Kitchen He reported his weight as 362 pounds. . Patient reported eating spaghetti for lunch  Patient with good understanding of regimen and poor understanding of indications.    Medication Assistance Findings:  Medication assistance needs identified: none  Extra Help:  Already receiving Full Extra Help Low Income Subsidy  Plan: . Will follow-up in 5-7 business days.    [name], PharmD, BCACP [facility] Clinical Pharmacist [phone]

## 2019-01-27 ENCOUNTER — Other Ambulatory Visit: Payer: Self-pay

## 2019-01-27 NOTE — Patient Outreach (Signed)
[place] [facility]) Care Management  01/27/2019  TREYVEON MOCHIZUKI [birth year] [id number]    Unsuccessful outreach attempt. Phone rang without option to leave a voice message. Will attempt outreach with Mr. Deupree next week.    [place] [phone]

## 2019-01-30 ENCOUNTER — Other Ambulatory Visit: Payer: Self-pay | Admitting: Pharmacist

## 2019-01-30 NOTE — Patient Outreach (Signed)
[name]) Care Management  01/30/2019  [name] 16-Feb-1966 [id number]   Patient was called for follow up . HIPAA idenfiers were obtained.    Patient is a 53 year old malewith medical history significant of CHF with numerous admissions for uncompensated CHF due to medication non-compliance and dietary indiscretions,  atrial fibrillation, HTN, MI, asthma, and OSA.  Patient did not go to the ED or hospital over the weekend (per his report).  He stated he felt "pretty good" and that his legs were "ok".  He reported his weight today as 371 pounds but that was not a dry weight as he went to weigh while we were on the phone.    Patient reported taking all of his medications. He did report taking Furosemide 80mg  2 tablets twice daily.  (The discharge instructions from 01/11/2019 stated Furosemide 120mg  twice daily or 1.5 tablets daily).    Not sure if patient increased the dose on his own. He will most likely run out of medication and it be too early to refill if he continues to take the furosemide this way.   A note will be sent to his PCP.  K- 3.7 two weeks ago (patient may need a new potassium level since he is taking more Furosemide than was listed on the discharge summary)  Plan: Follow up in 1 week. Route note to patient's PCP.  Elayne Guerin, PharmD, [place] Clinical Pharmacist [phone]

## 2019-02-02 ENCOUNTER — Ambulatory Visit: Payer: Self-pay

## 2019-02-03 ENCOUNTER — Other Ambulatory Visit: Payer: Self-pay

## 2019-02-03 NOTE — Patient Outreach (Signed)
[place] [facility]) Care Management  02/03/2019  TRINO HIGINBOTHAM 1966/04/19 [id number]    Attempted weekly outreach. Phone rang multiple times without option to leave a voice message.Will send unsuccessful outreach letter and continue weekly calls.    [place] [phone]

## 2019-02-07 ENCOUNTER — Encounter (HOSPITAL_COMMUNITY): Payer: Self-pay

## 2019-02-07 ENCOUNTER — Inpatient Hospital Stay (HOSPITAL_COMMUNITY)
Admission: EM | Admit: 2019-02-07 | Discharge: 2019-02-10 | DRG: 291 | Disposition: A | Discharge status: Home / Self Care | Payer: Medicare Other | Attending: Family Medicine | Admitting: Family Medicine

## 2019-02-07 ENCOUNTER — Emergency Department (HOSPITAL_COMMUNITY): Payer: Medicare Other

## 2019-02-07 ENCOUNTER — Ambulatory Visit: Payer: Self-pay | Admitting: Pharmacist

## 2019-02-07 ENCOUNTER — Other Ambulatory Visit: Payer: Self-pay

## 2019-02-07 ENCOUNTER — Other Ambulatory Visit: Payer: Self-pay | Admitting: Pharmacist

## 2019-02-07 DIAGNOSIS — Z9114 Patient's other noncompliance with medication regimen: Secondary | ICD-10-CM

## 2019-02-07 DIAGNOSIS — R0602 Shortness of breath: Secondary | ICD-10-CM | POA: Diagnosis not present

## 2019-02-07 DIAGNOSIS — R600 Localized edema: Secondary | ICD-10-CM | POA: Diagnosis not present

## 2019-02-07 DIAGNOSIS — K219 Gastro-esophageal reflux disease without esophagitis: Secondary | ICD-10-CM | POA: Diagnosis not present

## 2019-02-07 DIAGNOSIS — R609 Edema, unspecified: Secondary | ICD-10-CM

## 2019-02-07 DIAGNOSIS — I482 Chronic atrial fibrillation, unspecified: Secondary | ICD-10-CM | POA: Diagnosis not present

## 2019-02-07 DIAGNOSIS — Z8711 Personal history of peptic ulcer disease: Secondary | ICD-10-CM

## 2019-02-07 DIAGNOSIS — I5033 Acute on chronic diastolic (congestive) heart failure: Secondary | ICD-10-CM | POA: Diagnosis not present

## 2019-02-07 DIAGNOSIS — I130 Hypertensive heart and chronic kidney disease with heart failure and stage 1 through stage 4 chronic kidney disease, or unspecified chronic kidney disease: Secondary | ICD-10-CM | POA: Diagnosis not present

## 2019-02-07 DIAGNOSIS — J45909 Unspecified asthma, uncomplicated: Secondary | ICD-10-CM | POA: Diagnosis present

## 2019-02-07 DIAGNOSIS — E8770 Fluid overload, unspecified: Secondary | ICD-10-CM | POA: Diagnosis not present

## 2019-02-07 DIAGNOSIS — N182 Chronic kidney disease, stage 2 (mild): Secondary | ICD-10-CM | POA: Diagnosis not present

## 2019-02-07 DIAGNOSIS — Z8249 Family history of ischemic heart disease and other diseases of the circulatory system: Secondary | ICD-10-CM | POA: Diagnosis not present

## 2019-02-07 DIAGNOSIS — I2729 Other secondary pulmonary hypertension: Secondary | ICD-10-CM | POA: Diagnosis not present

## 2019-02-07 DIAGNOSIS — Z886 Allergy status to analgesic agent status: Secondary | ICD-10-CM | POA: Diagnosis not present

## 2019-02-07 DIAGNOSIS — I10 Essential (primary) hypertension: Secondary | ICD-10-CM | POA: Diagnosis not present

## 2019-02-07 DIAGNOSIS — Z7901 Long term (current) use of anticoagulants: Secondary | ICD-10-CM | POA: Diagnosis not present

## 2019-02-07 DIAGNOSIS — R0989 Other specified symptoms and signs involving the circulatory and respiratory systems: Secondary | ICD-10-CM | POA: Diagnosis not present

## 2019-02-07 DIAGNOSIS — M1990 Unspecified osteoarthritis, unspecified site: Secondary | ICD-10-CM | POA: Diagnosis not present

## 2019-02-07 DIAGNOSIS — E1122 Type 2 diabetes mellitus with diabetic chronic kidney disease: Secondary | ICD-10-CM | POA: Diagnosis not present

## 2019-02-07 DIAGNOSIS — Z91018 Allergy to other foods: Secondary | ICD-10-CM | POA: Diagnosis not present

## 2019-02-07 DIAGNOSIS — R635 Abnormal weight gain: Secondary | ICD-10-CM

## 2019-02-07 DIAGNOSIS — Z79899 Other long term (current) drug therapy: Secondary | ICD-10-CM

## 2019-02-07 DIAGNOSIS — R05 Cough: Secondary | ICD-10-CM | POA: Diagnosis not present

## 2019-02-07 DIAGNOSIS — I5082 Biventricular heart failure: Secondary | ICD-10-CM | POA: Diagnosis not present

## 2019-02-07 DIAGNOSIS — G4733 Obstructive sleep apnea (adult) (pediatric): Secondary | ICD-10-CM | POA: Diagnosis not present

## 2019-02-07 DIAGNOSIS — I428 Other cardiomyopathies: Secondary | ICD-10-CM | POA: Diagnosis present

## 2019-02-07 DIAGNOSIS — Z20828 Contact with and (suspected) exposure to other viral communicable diseases: Secondary | ICD-10-CM | POA: Diagnosis present

## 2019-02-07 DIAGNOSIS — F329 Major depressive disorder, single episode, unspecified: Secondary | ICD-10-CM | POA: Diagnosis present

## 2019-02-07 DIAGNOSIS — Z6841 Body Mass Index (BMI) 40.0 and over, adult: Secondary | ICD-10-CM

## 2019-02-07 DIAGNOSIS — Z888 Allergy status to other drugs, medicaments and biological substances status: Secondary | ICD-10-CM

## 2019-02-07 DIAGNOSIS — Z7951 Long term (current) use of inhaled steroids: Secondary | ICD-10-CM

## 2019-02-07 DIAGNOSIS — Z9111 Patient's noncompliance with dietary regimen: Secondary | ICD-10-CM | POA: Diagnosis not present

## 2019-02-07 DIAGNOSIS — I499 Cardiac arrhythmia, unspecified: Secondary | ICD-10-CM | POA: Diagnosis not present

## 2019-02-07 LAB — RAPID URINE DRUG SCREEN, HOSP PERFORMED
Amphetamines: NOT DETECTED
Barbiturates: NOT DETECTED
Benzodiazepines: NOT DETECTED
Cocaine: NOT DETECTED
Opiates: NOT DETECTED
Tetrahydrocannabinol: NOT DETECTED

## 2019-02-07 LAB — COMPREHENSIVE METABOLIC PANEL
ALT: 15 U/L (ref 0–44)
AST: 18 U/L (ref 15–41)
Albumin: 3.4 g/dL — ABNORMAL LOW (ref 3.5–5.0)
Alkaline Phosphatase: 50 U/L (ref 38–126)
Anion gap: 7 (ref 5–15)
BUN: 13 mg/dL (ref 6–20)
CO2: 24 mmol/L (ref 22–32)
Calcium: 8.1 mg/dL — ABNORMAL LOW (ref 8.9–10.3)
Chloride: 107 mmol/L (ref 98–111)
Creatinine, Ser: 1.02 mg/dL (ref 0.61–1.24)
GFR calc Af Amer: >60 mL/min (ref >60)
GFR calc non Af Amer: >60 mL/min (ref >60)
Glucose, Bld: 99 mg/dL (ref 70–99)
Potassium: 3.8 mmol/L (ref 3.5–5.1)
Sodium: 138 mmol/L (ref 135–145)
Total Bilirubin: 1.3 mg/dL — ABNORMAL HIGH (ref 0.3–1.2)
Total Protein: 6.9 g/dL (ref 6.5–8.1)

## 2019-02-07 LAB — CBC WITH DIFFERENTIAL/PLATELET
Abs Immature Granulocytes: 0.02 10*3/uL (ref 0.00–0.07)
Basophils Absolute: 0 10*3/uL (ref 0.0–0.1)
Basophils Relative: 0 %
Eosinophils Absolute: 0.1 10*3/uL (ref 0.0–0.5)
Eosinophils Relative: 2 %
HCT: 39.7 % (ref 39.0–52.0)
Hemoglobin: 12.2 g/dL — ABNORMAL LOW (ref 13.0–17.0)
Immature Granulocytes: 0 %
Lymphocytes Relative: 15 %
Lymphs Abs: 0.8 10*3/uL (ref 0.7–4.0)
MCH: 28.1 pg (ref 26.0–34.0)
MCHC: 30.7 g/dL (ref 30.0–36.0)
MCV: 91.5 fL (ref 80.0–100.0)
Monocytes Absolute: 0.4 10*3/uL (ref 0.1–1.0)
Monocytes Relative: 7 %
Neutro Abs: 4.1 10*3/uL (ref 1.7–7.7)
Neutrophils Relative %: 76 %
Platelets: 224 10*3/uL (ref 150–400)
RBC: 4.34 MIL/uL (ref 4.22–5.81)
RDW: 14 % (ref 11.5–15.5)
WBC: 5.4 10*3/uL (ref 4.0–10.5)
nRBC: 0 % (ref 0.0–0.2)

## 2019-02-07 LAB — URINALYSIS, ROUTINE W REFLEX MICROSCOPIC
Bacteria, UA: NONE SEEN
Bilirubin Urine: NEGATIVE
Glucose, UA: NEGATIVE mg/dL
Hgb urine dipstick: NEGATIVE
Ketones, ur: NEGATIVE mg/dL
Leukocytes,Ua: NEGATIVE
Nitrite: NEGATIVE
Protein, ur: 100 mg/dL — AB
Specific Gravity, Urine: 1.025 (ref 1.005–1.030)
pH: 5 (ref 5.0–8.0)

## 2019-02-07 LAB — SARS CORONAVIRUS 2 (TAT 6-24 HRS)

## 2019-02-07 LAB — BRAIN NATRIURETIC PEPTIDE: B Natriuretic Peptide: 288 pg/mL — ABNORMAL HIGH (ref 0.0–100.0)

## 2019-02-07 MED ORDER — RIVAROXABAN 20 MG PO TABS
20.0000 mg | ORAL_TABLET | Freq: Every day | ORAL | Status: DC
  Administered 2019-02-08 – 2019-02-09 (×2): 20 mg via ORAL
  Filled 2019-02-07 (×2): qty 1

## 2019-02-07 MED ORDER — FUROSEMIDE 10 MG/ML IJ SOLN
INTRAMUSCULAR | Status: AC
  Filled 2019-02-07: qty 20

## 2019-02-07 MED ORDER — POTASSIUM CHLORIDE CRYS ER 20 MEQ PO TBCR
40.0000 meq | EXTENDED_RELEASE_TABLET | Freq: Every day | ORAL | Status: DC
  Administered 2019-02-08 – 2019-02-10 (×3): 40 meq via ORAL
  Filled 2019-02-07 (×3): qty 2

## 2019-02-07 MED ORDER — CARVEDILOL 12.5 MG PO TABS
25.0000 mg | ORAL_TABLET | Freq: Two times a day (BID) | ORAL | Status: DC
  Administered 2019-02-08 – 2019-02-10 (×5): 25 mg via ORAL
  Filled 2019-02-07 (×5): qty 2

## 2019-02-07 MED ORDER — ONDANSETRON HCL 4 MG/2ML IJ SOLN: 4.0000 mg | Freq: Four times a day (QID) | INTRAMUSCULAR | Status: DC | PRN

## 2019-02-07 MED ORDER — ATORVASTATIN CALCIUM 40 MG PO TABS
40.0000 mg | ORAL_TABLET | Freq: Every day | ORAL | Status: DC
  Administered 2019-02-08 – 2019-02-10 (×3): 40 mg via ORAL
  Filled 2019-02-07 (×3): qty 1

## 2019-02-07 MED ORDER — INSULIN ASPART 100 UNIT/ML ~~LOC~~ SOLN: 0.0000 [IU] | Freq: Three times a day (TID) | SUBCUTANEOUS | Status: DC

## 2019-02-07 MED ORDER — ONDANSETRON HCL 4 MG PO TABS: 4.0000 mg | ORAL_TABLET | Freq: Four times a day (QID) | ORAL | Status: DC | PRN

## 2019-02-07 MED ORDER — POLYETHYLENE GLYCOL 3350 17 G PO PACK: 17.0000 g | PACK | Freq: Every day | ORAL | Status: DC | PRN

## 2019-02-07 MED ORDER — TAMSULOSIN HCL 0.4 MG PO CAPS
0.4000 mg | ORAL_CAPSULE | Freq: Every day | ORAL | Status: DC
  Administered 2019-02-08 – 2019-02-10 (×3): 0.4 mg via ORAL
  Filled 2019-02-07 (×3): qty 1

## 2019-02-07 MED ORDER — PANTOPRAZOLE SODIUM 40 MG PO TBEC
40.0000 mg | DELAYED_RELEASE_TABLET | Freq: Every day | ORAL | Status: DC
  Administered 2019-02-08 – 2019-02-10 (×2): 40 mg via ORAL
  Filled 2019-02-07 (×2): qty 1

## 2019-02-07 MED ORDER — ACETAMINOPHEN 325 MG PO TABS: 650.0000 mg | ORAL_TABLET | Freq: Four times a day (QID) | ORAL | Status: DC | PRN

## 2019-02-07 MED ORDER — FUROSEMIDE 10 MG/ML IJ SOLN
120.0000 mg | Freq: Once | INTRAVENOUS | Status: AC
  Administered 2019-02-07: 120 mg via INTRAVENOUS
  Filled 2019-02-07: qty 12

## 2019-02-07 MED ORDER — BUDESONIDE 0.25 MG/2ML IN SUSP
0.2500 mg | Freq: Two times a day (BID) | RESPIRATORY_TRACT | Status: DC
  Administered 2019-02-08 – 2019-02-10 (×5): 0.25 mg via RESPIRATORY_TRACT
  Filled 2019-02-07 (×5): qty 2

## 2019-02-07 MED ORDER — LABETALOL HCL 5 MG/ML IV SOLN
10.0000 mg | INTRAVENOUS | Status: DC | PRN
  Administered 2019-02-07 – 2019-02-08 (×2): 10 mg via INTRAVENOUS
  Filled 2019-02-07 (×2): qty 4

## 2019-02-07 MED ORDER — ACETAMINOPHEN 650 MG RE SUPP: 650.0000 mg | Freq: Four times a day (QID) | RECTAL | Status: DC | PRN

## 2019-02-07 MED ORDER — ALBUTEROL SULFATE (2.5 MG/3ML) 0.083% IN NEBU: 2.5000 mg | INHALATION_SOLUTION | Freq: Four times a day (QID) | RESPIRATORY_TRACT | Status: DC | PRN

## 2019-02-07 NOTE — H&P (Signed)
History and Physical    Samuel KlippelMark T Fitzpatrick ZOX:[id number] DOB: 08/17/1965 DOA: 02/07/2019  PCP: [name], [name]   Patient coming from: Home  I have personally briefly reviewed patient's [facility]  Chief Complaint: Shortness of breath, weight gain  HPI: [name] is a 53 y.o. male with medical history significant for morbid obesity BMI 50, atrial fibrillation, pulmonary hypertension, diastolic CHF, depression, presented to the ED with complaints of increasing difficulty breathing over the past week.  Reports cough productive of whitish sputum.  He denies chest pain.  No fever, but subjective chills.  Patient reports over the past week he is gained about 10 pounds-with leg swelling and abdominal bloating.  Tells me his stable weight is about 360 pounds, he weighs 374 pounds today.  Reports compliance with his Lasix 120mg  every 12hrs, he tells me that despite increased urination his legs continue to swell.  He is not compliant with a low-salt diet.  Hospitalization 7/20- 7/22-for decompensated CHF.  Patient discharged home on 120 mg Lasix twice daily, discharge weight was 352 pounds.  ED Course: Blood pressure systolic 150s to 811B180s, O2 sats greater than 91% on room air.  Creatinine 1.02.  Portable chest x-ray shows mild chronic failure.  SARS-CoV-2 test was invalid and recollection was requested.  Patient was given 120 mg of IV Lasix in the ED.  Hospitalist to admit for decompensated CHF.  Review of Systems: As per HPI all other systems reviewed and negative.  Past Medical History:  Diagnosis Date   Allergic rhinitis    Asthma    Atrial fibrillation (HCC)    CHF (congestive heart failure) (HCC)    Elevated troponin level not due myocardial infarction 06/13/2015   cath w/ nl cors, felt 2nd HTN, CHF, peat trop 2.73   Essential hypertension    Gastroesophageal reflux disease    History of cardiac catheterization    Normal coronaries December 2016    Noncompliance    Major problem leading to declining health and recurrent hospitalization   Nonischemic cardiomyopathy (HCC)    a. LVEF 20-25% in 2017 with cath in 2016 showing normal cors. b. EF 55% by echo in 03/2018.   On home O2    3L N/C   OSA (obstructive sleep apnea)    Osteoarthritis    Peptic ulcer disease     Past Surgical History:  Procedure Laterality Date   CARDIAC CATHETERIZATION N/A 06/14/2015   Procedure: Right/Left Heart Cath and Coronary Angiography;  Surgeon: Runell GessJonathan J Berry, Fitzpatrick;  [facility];  Service: Cardiovascular;  Laterality: N/A;     reports that he quit smoking about 11 years ago. His smoking use included cigarettes. He started smoking about 30 years ago. He has a 10.00 pack-year smoking history. He has never used smokeless tobacco. He reports previous alcohol use. He reports that he does not use drugs.  Allergies  Allergen Reactions   Banana Shortness Of Breath   Bee Venom Shortness Of Breath, Swelling and Other (See Comments)    Reaction:  Facial swelling   Strawberry Flavor Shortness Of Breath and Rash   Aspirin Other (See Comments)    Reaction:  GI upset    Metolazone Other (See Comments)    Pt states that he stopped taking this med due to heart attack like symptoms.    Oatmeal Nausea And Vomiting   Orange Juice [Orange Oil] Nausea And Vomiting    All acidic products make him nauseous  and upset stomach   Torsemide Swelling and Other (See Comments)    Reaction:  Swelling of feet/legs    Diltiazem Palpitations   Hydralazine Palpitations   Lipitor [Atorvastatin] Other (See Comments)    Reaction:  Nose bleeds : Patient is currently taking this medication    Family History  Problem Relation Age of Onset   Stroke Father    Heart attack Father    Aneurysm Mother        Cerebral aneurysm   Hypertension Sister    Colon cancer Neg Hx    Inflammatory bowel disease Neg Hx    Liver disease Neg Hx     Prior  to Admission medications   Medication Sig Start Date End Date Taking? Authorizing Provider  acetaminophen (TYLENOL) 325 MG tablet Take 2 tablets (650 mg total) by mouth every 4 (four) hours as needed for headache or mild pain. 11/02/18  Yes Calvert Cantor, Fitzpatrick  albuterol (PROVENTIL) (2.5 MG/3ML) 0.083% nebulizer solution Take 2.5 mg by nebulization every 6 (six) hours as needed for wheezing or shortness of breath.   Yes Provider, Historical, Fitzpatrick  atorvastatin (LIPITOR) 40 MG tablet Take 40 mg by mouth daily.  09/05/18  Yes Provider, Historical, Fitzpatrick  carvedilol (COREG) 25 MG tablet Take 1 tablet (25 mg total) by mouth 2 (two) times daily with a meal for 30 days. 08/18/18 11/04/19 Yes Shah, Pratik D, DO  docusate sodium (COLACE) 100 MG capsule Take 1 capsule (100 mg total) by mouth 2 (two) times daily. Patient taking differently: Take 100 mg by mouth every morning.  06/25/16  Yes Kathlen Mody, Fitzpatrick  ferrous gluconate (FERGON) 324 MG tablet Take 1 tablet (324 mg total) by mouth 2 (two) times daily with a meal. Patient taking differently: Take 324 mg by mouth daily.  06/25/16  Yes Kathlen Mody, Fitzpatrick  fluticasone (FLOVENT HFA) 220 MCG/ACT inhaler Inhale 2 puffs into the lungs every evening.    Yes Provider, Historical, Fitzpatrick  furosemide (LASIX) 80 MG tablet Take 1.5 tablets (120 mg total) by mouth 2 (two) times daily. 12/18/18  Yes Vassie Loll, Fitzpatrick  lisinopril (ZESTRIL) 20 MG tablet Take 20 mg by mouth daily. 12/28/18  Yes Provider, Historical, Fitzpatrick  pantoprazole (PROTONIX) 40 MG tablet Take 1 tablet (40 mg total) by mouth daily at 6 (six) AM. 06/26/16  Yes Kathlen Mody, Fitzpatrick  potassium chloride SA (K-DUR) 20 MEQ tablet Take 2 tablets (40 mEq total) by mouth daily. Patient taking differently: Take 20 mEq by mouth 2 (two) times daily.  12/18/18  Yes Vassie Loll, Fitzpatrick  rivaroxaban (XARELTO) 20 MG TABS tablet Take 1 tablet (20 mg total) by mouth daily with breakfast. Patient taking differently: Take 20 mg by mouth daily with  supper.  06/26/16  Yes Kathlen Mody, Fitzpatrick  silver sulfADIAZINE (SILVADENE) 1 % cream Apply 1 application topically daily. 01/02/19  Yes Rancour, Jeannett Senior, Fitzpatrick  spironolactone (ALDACTONE) 100 MG tablet Take 0.5 tablets (50 mg total) by mouth daily. For fluid 12/18/18  Yes Vassie Loll, Fitzpatrick  tamsulosin (FLOMAX) 0.4 MG CAPS capsule Take 1 capsule (0.4 mg total) by mouth daily. 06/25/16  Yes Kathlen Mody, Fitzpatrick  oxyCODONE-acetaminophen (PERCOCET/ROXICET) 5-325 MG tablet Take 1 tablet by mouth every 6 (six) hours as needed. Patient not taking: Reported on 02/07/2019 12/30/18   Bethann Berkshire, Fitzpatrick  potassium chloride 20 MEQ TBCR Take 20 mEq by mouth 2 (two) times daily. 09/03/15 11/04/18  Ward, Layla Maw, DO    Physical Exam: Vitals:  02/07/19 2130 02/07/19 2200 02/07/19 2235 02/07/19 2245  BP: (!) 158/121 (!) 188/123 (!) 179/114   Pulse: (!) 51 78 (!) 109   Resp:   (!) 24   Temp:   99.6 F (37.6 C)   TempSrc:   Oral   SpO2: 95% 95% 95%   Weight:      Height:    6' (1.829 m)    Constitutional: NAD, calm, comfortable, morbidly obese Vitals:   02/07/19 2130 02/07/19 2200 02/07/19 2235 02/07/19 2245  BP: (!) 158/121 (!) 188/123 (!) 179/114   Pulse: (!) 51 78 (!) 109   Resp:   (!) 24   Temp:   99.6 F (37.6 C)   TempSrc:   Oral   SpO2: 95% 95% 95%   Weight:      Height:    6' (1.829 m)   Eyes: PERRL, lids and conjunctivae normal ENMT: Mucous membranes are moist. Posterior pharynx clear of any exudate or lesions. Neck: normal, supple, no masses, no thyromegaly Respiratory: clear to auscultation bilaterally, no wheezing, no crackles. Normal respiratory effort. No accessory muscle use.  Cardiovascular: Irregular rate and rhythm, no murmurs / rubs / gallops.  Chronic lower extremity woody induration/lymphedema bilaterally, right much worse than left.  2+ pedal pulses.   Abdomen: Obese no tenderness, no masses palpated. No hepatosplenomegaly. Bowel sounds positive.  Musculoskeletal: no clubbing /  cyanosis. No joint deformity upper and lower extremities. Good ROM, no contractures. Normal muscle tone.  Skin: Woody induration with current lower extremity Neurologic: CN 2-12 grossly intact.  Strength 5/5 in all 4.  Psychiatric: Normal judgment and insight. Alert and oriented x 3. Normal mood.   Labs on Admission: I have personally reviewed following labs and imaging studies  CBC: Recent Labs  Lab 02/07/19 1650  WBC 5.4  NEUTROABS 4.1  HGB 12.2*  HCT 39.7  MCV 91.5  PLT 720   Basic Metabolic Panel: Recent Labs  Lab 02/07/19 1650  NA 138  K 3.8  CL 107  CO2 24  GLUCOSE 99  BUN 13  CREATININE 1.02  CALCIUM 8.1*   Liver Function Tests: Recent Labs  Lab 02/07/19 1650  AST 18  ALT 15  ALKPHOS 50  BILITOT 1.3*  PROT 6.9  ALBUMIN 3.4*   Urine analysis:    Component Value Date/Time   COLORURINE YELLOW 02/07/2019 1811   APPEARANCEUR CLEAR 02/07/2019 1811   LABSPEC 1.025 02/07/2019 1811   PHURINE 5.0 02/07/2019 1811   GLUCOSEU NEGATIVE 02/07/2019 1811   GLUCOSEU NEG mg/dL 01/24/2010 2254   HGBUR NEGATIVE 02/07/2019 1811   BILIRUBINUR NEGATIVE 02/07/2019 1811   KETONESUR NEGATIVE 02/07/2019 1811   PROTEINUR 100 (A) 02/07/2019 1811   UROBILINOGEN 0.2 08/08/2014 1445   NITRITE NEGATIVE 02/07/2019 1811   LEUKOCYTESUR NEGATIVE 02/07/2019 1811    Radiological Exams on Admission: Dg Chest Port 1 View  Result Date: 02/07/2019 CLINICAL DATA:  Shortness of breath, nonproductive cough for 1 week, history CHF, asthma, hypertension, atrial fibrillation, non ischemic cardiomyopathy EXAM: PORTABLE CHEST 1 VIEW COMPARISON:  Portable exam 1624 hours compared to 01/09/2019 FINDINGS: Enlargement of cardiac silhouette with pulmonary vascular congestion. Mediastinal contour stable. Minimal pulmonary edema slightly greater on RIGHT. No pleural effusion or pneumothorax. Bones unremarkable. IMPRESSION: Mild chronic failure. Electronically Signed   By: Lavonia Dana M.D.   On: 02/07/2019  16:39    EKG: Independently reviewed.  Atrial fibrillation rate 127.  QTC 466.  Assessment/Plan Active Problems:   Fluid overload   Decompensated diastolic  CHF- per patient weight gain of about 10 pounds, per charts he is gained at least 20 pounds, since 7/22.  On room air. Portable chest x-ray shows mild chronic failure.  Likely more of right heart failure considering history of pulmonary hypertension.  History of medication noncompliance documented in chart, also not compliant with low-salt diet- but he reports compliance to Lasix 120 mg twice daily.  Last echo 12/08/2018 -EF 50 to 55%, indeterminate diastolic function.  120 mg Lasix given in ED. -Hold further Lasix dosing pending creatinine in a.m, with amount of Lasix given in ED -BMP a.m. -High-sensitivity troponin -Strict input output, daily weights -Fluid restriction -Hold home Lasix 120 mg twice daily, lisinopril and spironolactone -Indeterminate COVID 19 test, negative prior test, repeat requested and ordered.  Atrial fibrillation-rate controlled, anticoagulated. -Resume home Xarelto, carvedilol.  Morbid obesity, depression, OSA-complicates overall care.  Not on medication. - CPAP QHS  Diabetes mellitus-random glucose 99.  Not on medication -Fasting CBG  Hypertension-elevated. -Resume home carvedilol -PRN labetalol   DVT prophylaxis: Xarelto Code Status: Full-confirmed with patient at bedside Family Communication: None at bedside Disposition Plan: Per rounding team Consults called: None Admission status: Perseveration, telemetry  Onnie BoerEjiroghene E Fawne Hughley Fitzpatrick [facility]  02/07/2019, 11:12 PM

## 2019-02-07 NOTE — Patient Outreach (Signed)
[facility]) Care Management  02/07/2019  [name] 1966/02/09 [id number]   Patient was called regarding medication management and follow up. Unfortunately, he did not answer the phone. His phone rang >20 times without voicemail pick up.   From chart review, patient does not appear to have gone to the emergency room since 01/09/2019!  Plan: Call patient back in 10-14 business days. Neldon Labella, RN sent unsuccessful outreach letter on 02/03/2019.   Elayne Guerin, PharmD, [employer] Clinical Pharmacist [phone]

## 2019-02-07 NOTE — ED Triage Notes (Addendum)
Pt reports SOB for several days. Sats 96 % on RA. when EMS arrived. Pt reports cough. Pt BP 172/111. Pt has not been taking BP meds

## 2019-02-07 NOTE — Plan of Care (Signed)
  Problem: Education: Goal: Knowledge of General Education information will improve Description: Including pain rating scale, medication(s)/side effects and non-pharmacologic comfort measures Outcome: Not Progressing   Problem: Activity: Goal: Risk for activity intolerance will decrease Outcome: Not Progressing   Problem: Pain Managment: Goal: General experience of comfort will improve Outcome: Not Progressing   Problem: Safety: Goal: Ability to remain free from injury will improve Outcome: Not Progressing

## 2019-02-07 NOTE — ED Provider Notes (Signed)
[facility] EMERGENCY DEPARTMENT Provider Note   CSN: [id number] Arrival date & time: 02/07/19  1551    History   Chief Complaint Chief Complaint  Patient presents with  . Shortness of Breath    HPI [name] is a 53 y.o. male.     HPI   The patient presents for evaluation of leg swelling, shortness of breath, and general fatigue for several days.  He states that his current weight is 372 pounds.  He states that he is taking his usual medications, as prescribed.  He states that when he takes the furosemide he feels like it causes his legs to swell but sometimes he is able to urinate and that helps the swelling.  He denies fever, chills, nausea or vomiting.  He has a cough which is occasionally productive of white-colored sputum.  He denies fever or chills.  He denies chest pain.  He feels like his abdomen is swollen but it does not hurt.  He presents for evaluation, by EMS.  Notes in chart indicates that he has frequent contacts by healthcare coordinators, and they state that he has medication noncompliance and sometimes they are unable to contact him.  There are no other known modifying factors.  Past Medical History:  Diagnosis Date  . Allergic rhinitis   . Asthma   . Atrial fibrillation (HCC)   . CHF (congestive heart failure) (HCC)   . Elevated troponin level not due myocardial infarction 06/13/2015   cath w/ nl cors, felt 2nd HTN, CHF, peat trop 2.73  . Essential hypertension   . Gastroesophageal reflux disease   . History of cardiac catheterization    Normal coronaries December 2016  . Noncompliance    Major problem leading to declining health and recurrent hospitalization  . Nonischemic cardiomyopathy (HCC)    a. LVEF 20-25% in 2017 with cath in 2016 showing normal cors. b. EF 55% by echo in 03/2018.  . On home O2    3L N/C  . OSA (obstructive sleep apnea)   . Osteoarthritis   . Peptic ulcer disease     Patient Active Problem List   Diagnosis Date Noted  .  Cellulitis of right leg   . Acute diastolic CHF (congestive heart failure) (HCC) 12/15/2018  . Cellulitis 12/08/2018  . Chronic obstructive pulmonary disease (HCC)   . Benign prostatic hyperplasia without lower urinary tract symptoms   . Acute on chronic combined systolic and diastolic CHF (congestive heart failure) (HCC) 10/02/2018  . AKI (acute kidney injury) (HCC) 09/18/2018  . Acute on chronic combined systolic and diastolic congestive heart failure (HCC) 08/15/2018  . SOB (shortness of breath) 08/03/2018  . Volume overload 07/22/2018  . Acute systolic CHF (congestive heart failure) (HCC) 07/04/2018  . Acute on chronic combined systolic and diastolic heart failure (HCC) 04/01/2018  . Hypomagnesemia 04/01/2018  . Acute on chronic diastolic CHF (congestive heart failure) (HCC) 03/11/2018  . Pressure injury of skin 03/11/2018  . CHF (congestive heart failure) (HCC) 09/08/2017  . Altered mental status 05/11/2017  . Acute exacerbation of CHF (congestive heart failure) (HCC) 04/20/2017  . Acute on chronic systolic (congestive) heart failure (HCC) 04/19/2017  . CHF exacerbation (HCC) 04/11/2017  . Chronic respiratory failure with hypoxia (HCC) 04/11/2017  . Encounter for hospice care discussion   . Palliative care encounter   . Goals of care, counseling/discussion   . DNR (do not resuscitate)   . DNR (do not resuscitate) discussion   . Chronic congestive heart failure (HCC)  02/18/2017  . Acute systolic heart failure (HCC) 01/18/2017  . Pedal edema 12/02/2016  . Acute CHF (congestive heart failure) (HCC) 11/09/2016  . Acute on chronic systolic CHF (congestive heart failure) (HCC) 08/19/2016  . Chronic renal failure, stage 2 (mild) 05/17/2016  . Coronary arteries, normal Dec 2016 01/23/2016  . Chronic anticoagulation-Xarelto 01/23/2016  . NSVT (nonsustained ventricular tachycardia) (HCC) 01/23/2016  . Elevated troponin 12/21/2015  . Nonischemic cardiomyopathy (HCC)   . Obesity, Class  III, BMI 40-49.9 (morbid obesity) (HCC)   . Noncompliance with diet and medication regimen 12/02/2015  . OSA (obstructive sleep apnea) 09/20/2015  . Pulmonary hypertension (HCC) 09/19/2015  . Normocytic anemia 09/19/2015  . Atrial fibrillation, chronic   . Dyspnea on exertion 05/28/2015  . Acute respiratory failure with hypoxia (HCC) 04/01/2015  . Hypokalemia 04/01/2015  . Obesity hypoventilation syndrome (HCC) 08/08/2014  . GERD (gastroesophageal reflux disease) 08/08/2014  . Edema of right lower extremity 06/21/2014  . Fecal urgency 06/21/2014  . Asthma 02/11/2009  . Hyperlipidemia 07/12/2008  . OBESITY, MORBID 07/12/2008  . Anxiety state 07/12/2008  . DEPRESSION 07/12/2008  . Essential hypertension 07/12/2008  . ALLERGIC RHINITIS 07/12/2008  . Peptic ulcer 07/12/2008  . OSTEOARTHRITIS 07/12/2008    Past Surgical History:  Procedure Laterality Date  . CARDIAC CATHETERIZATION N/A 06/14/2015   Procedure: Right/Left Heart Cath and Coronary Angiography;  Surgeon: Runell GessJonathan J Berry, MD;  [facility];  Service: Cardiovascular;  Laterality: N/A;        Home Medications    Prior to Admission medications   Medication Sig Start Date End Date Taking? Authorizing Provider  acetaminophen (TYLENOL) 325 MG tablet Take 2 tablets (650 mg total) by mouth every 4 (four) hours as needed for headache or mild pain. 11/02/18  Yes Calvert Cantorizwan, Saima, MD  albuterol (PROVENTIL) (2.5 MG/3ML) 0.083% nebulizer solution Take 2.5 mg by nebulization every 6 (six) hours as needed for wheezing or shortness of breath.   Yes [provider]  atorvastatin (LIPITOR) 40 MG tablet Take 40 mg by mouth daily.  09/05/18  Yes [provider]  carvedilol (COREG) 25 MG tablet Take 1 tablet (25 mg total) by mouth 2 (two) times daily with a meal for 30 days. 08/18/18 11/04/19 Yes Shah, Pratik D, DO  docusate sodium (COLACE) 100 MG capsule Take 1 capsule (100 mg total) by mouth 2 (two) times  daily. Patient taking differently: Take 100 mg by mouth every morning.  06/25/16  Yes Kathlen ModyAkula, Vijaya, MD  ferrous gluconate (FERGON) 324 MG tablet Take 1 tablet (324 mg total) by mouth 2 (two) times daily with a meal. Patient taking differently: Take 324 mg by mouth daily.  06/25/16  Yes Kathlen ModyAkula, Vijaya, MD  fluticasone (FLOVENT HFA) 220 MCG/ACT inhaler Inhale 2 puffs into the lungs every evening.    Yes [provider]  furosemide (LASIX) 80 MG tablet Take 1.5 tablets (120 mg total) by mouth 2 (two) times daily. 12/18/18  Yes Vassie LollMadera, Carlos, MD  lisinopril (ZESTRIL) 20 MG tablet Take 20 mg by mouth daily. 12/28/18  Yes [provider]  pantoprazole (PROTONIX) 40 MG tablet Take 1 tablet (40 mg total) by mouth daily at 6 (six) AM. 06/26/16  Yes Kathlen ModyAkula, Vijaya, MD  potassium chloride SA (K-DUR) 20 MEQ tablet Take 2 tablets (40 mEq total) by mouth daily. Patient taking differently: Take 20 mEq by mouth 2 (two) times daily.  12/18/18  Yes Vassie LollMadera, Carlos, MD  rivaroxaban (XARELTO) 20 MG TABS tablet Take 1 tablet (  20 mg total) by mouth daily with breakfast. Patient taking differently: Take 20 mg by mouth daily with supper.  06/26/16  Yes Hosie Poisson, MD  silver sulfADIAZINE (SILVADENE) 1 % cream Apply 1 application topically daily. 01/02/19  Yes Rancour, Annie Main, MD  spironolactone (ALDACTONE) 100 MG tablet Take 0.5 tablets (50 mg total) by mouth daily. For fluid 12/18/18  Yes Barton Dubois, MD  tamsulosin (FLOMAX) 0.4 MG CAPS capsule Take 1 capsule (0.4 mg total) by mouth daily. 06/25/16  Yes Hosie Poisson, MD  oxyCODONE-acetaminophen (PERCOCET/ROXICET) 5-325 MG tablet Take 1 tablet by mouth every 6 (six) hours as needed. Patient not taking: Reported on 02/07/2019 12/30/18   Milton Ferguson, MD  potassium chloride 20 MEQ TBCR Take 20 mEq by mouth 2 (two) times daily. 09/03/15 11/04/18  Ward, Delice Bison, DO    Family History Family History  Problem Relation Age of Onset  . Stroke Father   . Heart attack  Father   . Aneurysm Mother        Cerebral aneurysm  . Hypertension Sister   . Colon cancer Neg Hx   . Inflammatory bowel disease Neg Hx   . Liver disease Neg Hx     Social History Social History   Tobacco Use  . Smoking status: Former Smoker    Packs/day: 0.50    Years: 20.00    Pack years: 10.00    Types: Cigarettes    Start date: 04/26/1988    Quit date: 06/23/2007    Years since quitting: 11.6  . Smokeless tobacco: Never Used  . Tobacco comment: 1 ppd former smoker  Substance Use Topics  . Alcohol use: Not Currently    Alcohol/week: 0.0 standard drinks    Comment: No etoh since 2009  . Drug use: No     Allergies   Banana, Bee venom, Strawberry flavor, Aspirin, Metolazone, Oatmeal, Orange juice [orange oil], Torsemide, Diltiazem, Hydralazine, and Lipitor [atorvastatin]   Review of Systems Review of Systems  All other systems reviewed and are negative.    Physical Exam Updated Vital Signs BP (!) 153/88   Pulse 95   Temp 98.6 F (37 C) (Oral)   Resp (!) 22   Wt (!) 170.6 kg   SpO2 94%   BMI 50.99 kg/m   Physical Exam Vitals signs and nursing note reviewed.  Constitutional:      General: He is not in acute distress.    Appearance: He is well-developed. He is obese. He is ill-appearing. He is not toxic-appearing or diaphoretic.  HENT:     Head: Normocephalic and atraumatic.     Right Ear: External ear normal.     Left Ear: External ear normal.     Mouth/Throat:     Pharynx: No oropharyngeal exudate.  Eyes:     Conjunctiva/sclera: Conjunctivae normal.     Pupils: Pupils are equal, round, and reactive to light.  Neck:     Musculoskeletal: Normal range of motion and neck supple.     Trachea: Phonation normal.  Cardiovascular:     Rate and Rhythm: Tachycardia present. Rhythm irregular.     Heart sounds: Normal heart sounds.  Pulmonary:     Effort: Pulmonary effort is normal.     Comments: Decreased air movement, bilaterally, without rhonchi or  wheezes. Abdominal:     General: There is distension.     Palpations: Abdomen is soft.     Tenderness: There is no abdominal tenderness.  Musculoskeletal:     Comments: 4+ bilateral  lower leg swelling, without significant tenderness or deformity.  Skin:    General: Skin is warm and dry.     Coloration: Skin is not jaundiced or pale.  Neurological:     Mental Status: He is alert and oriented to person, place, and time.     Cranial Nerves: No cranial nerve deficit.     Sensory: No sensory deficit.     Motor: No abnormal muscle tone.     Coordination: Coordination normal.  Psychiatric:        Mood and Affect: Mood normal.        Behavior: Behavior normal.        Thought Content: Thought content normal.        Judgment: Judgment normal.      ED Treatments / Results  Labs (all labs ordered are listed, but only abnormal results are displayed) Labs Reviewed  COMPREHENSIVE METABOLIC PANEL - Abnormal; Notable for the following components:      Result Value   Calcium 8.1 (*)    Albumin 3.4 (*)    Total Bilirubin 1.3 (*)    All other components within normal limits  CBC WITH DIFFERENTIAL/PLATELET - Abnormal; Notable for the following components:   Hemoglobin 12.2 (*)    All other components within normal limits  BRAIN NATRIURETIC PEPTIDE - Abnormal; Notable for the following components:   B Natriuretic Peptide 288.0 (*)    All other components within normal limits  URINALYSIS, ROUTINE W REFLEX MICROSCOPIC - Abnormal; Notable for the following components:   Protein, ur 100 (*)    All other components within normal limits  SARS CORONAVIRUS 2  RAPID URINE DRUG SCREEN, HOSP PERFORMED    EKG EKG Interpretation  Date/Time:  Tuesday February 07 2019 15:58:51 EDT Ventricular Rate:  127 PR Interval:    QRS Duration: 86 QT Interval:  335 QTC Calculation: 466 R Axis:   52 Text Interpretation:  Atrial fibrillation Abberant conduction of single QRS Nonspecific T abnormalities, lateral  leads since last tracing no significant change Confirmed by Mancel Bale [phone]) on 02/07/2019 4:30:31 PM   Radiology Dg Chest Port 1 View  Result Date: 02/07/2019 CLINICAL DATA:  Shortness of breath, nonproductive cough for 1 week, history CHF, asthma, hypertension, atrial fibrillation, non ischemic cardiomyopathy EXAM: PORTABLE CHEST 1 VIEW COMPARISON:  Portable exam 1624 hours compared to 01/09/2019 FINDINGS: Enlargement of cardiac silhouette with pulmonary vascular congestion. Mediastinal contour stable. Minimal pulmonary edema slightly greater on RIGHT. No pleural effusion or pneumothorax. Bones unremarkable. IMPRESSION: Mild chronic failure. Electronically Signed   By: Ulyses Southward M.D.   On: 02/07/2019 16:39    Procedures .Critical Care Performed by: Mancel Bale, MD Authorized by: Mancel Bale, MD   Critical care provider statement:    Critical care time (minutes):  35   Critical care start time:  02/07/2019 3:52 PM   Critical care end time:  02/07/2019 7:52 PM   Critical care time was exclusive of:  Separately billable procedures and treating other patients   Critical care was necessary to treat or prevent imminent or life-threatening deterioration of the following conditions:  Circulatory failure   Critical care was time spent personally by me on the following activities:  Blood draw for specimens, development of treatment plan with patient or surrogate, discussions with consultants, evaluation of patient's response to treatment, examination of patient, obtaining history from patient or surrogate, ordering and performing treatments and interventions, ordering and review of laboratory studies, pulse oximetry, re-evaluation of patient's condition, review  of old charts and ordering and review of radiographic studies   (including critical care time)  Medications Ordered in ED Medications  furosemide (LASIX) 120 mg in dextrose 5 % 50 mL IVPB (120 mg Intravenous New Bag/Given 02/07/19  2034)  furosemide (LASIX) 10 MG/ML injection (has no administration in time range)     Initial Impression / Assessment and Plan / ED Course  I have reviewed the triage vital signs and the nursing notes.  Pertinent labs & imaging results that were available during my care of the patient were reviewed by me and considered in my medical decision making (see chart for details).  Clinical Course as of Feb 07 2120  Tue Feb 07, 2019  1941 Normal  Urine rapid drug screen (hosp performed) [EW]  1942 Mild elevation  Brain natriuretic peptide(!) [EW]  1942 Normal except calcium low, bilirubin low  Comprehensive metabolic panel(!) [EW]  1942 Normal except protein present  Urinalysis, Routine w reflex microscopic(!) [EW]  1942 Normal except hemoglobin slightly low  CBC with Differential(!) [EW]  1943 Cardiomegaly with pulmonary vascular congestion, no pulmonary edema.  Image reviewed by me.  DG Chest Port 1 View [EW]    Clinical Course User Index [EW] Mancel Bale, MD        Patient Vitals for the past 24 hrs:  BP Temp Temp src Pulse Resp SpO2 Weight  02/07/19 2000 (!) 153/88 - - 95 - 94 % -  02/07/19 1930 (!) 167/127 - - 66 (!) 22 92 % -  02/07/19 1900 (!) 153/113 - - - (!) 26 - -  02/07/19 1830 (!) 157/100 - - 78 (!) 24 95 % -  02/07/19 1800 (!) 161/118 - - (!) 103 (!) 33 96 % -  02/07/19 1730 (!) 155/89 - - 92 (!) 27 96 % -  02/07/19 1700 (!) 169/110 - - 65 (!) 28 (!) 80 % -  02/07/19 1601 (!) 179/109 98.6 F (37 C) Oral (!) 110 (!) 29 93 % -  02/07/19 1559 - - - - - - (!) 170.6 kg    9:15  PM Reevaluation with update and discussion. After initial assessment and treatment, an updated evaluation reveals he has urinated about 800 cc after treatment with Lasix.  He feels uncomfortable at this time while sitting in bed.  Findings discussed with the patient and questions were answered. Mancel Bale   Medical Decision Making: Painful swelling legs, with history of congestive heart  failure.  Patient's weight is currently 10 kg higher than at hospital discharge, 1 month ago.  Renal function is normal.  Chest x-ray indicates mild pulmonary vascular congestion without overt edema.  EKG is reassuring.  Doubt ACS, acute congestive heart failure, serious bacterial infection or metabolic instability.  The patient may or may not be taking his usual dose of Lasix, and has a significant weight gain of 10 kg over the last month.  I think would be reasonable to hospitalize him for parenteral diuretic therapy, since his oral Lasix absorption may be compromised by significant fluid overload.   [name] was evaluated in Emergency Department on 02/07/2019 for the symptoms described in the history of present illness. He was evaluated in the context of the global COVID-19 pandemic, which necessitated consideration that the patient might be at risk for infection with the SARS-CoV-2 virus that causes COVID-19. Institutional protocols and algorithms that pertain to the evaluation of patients at risk for COVID-19 are in a state of rapid change based on  information released by regulatory bodies including the CDC and federal and state organizations. These policies and algorithms were followed during the patient's care in the ED.  CRITICAL CARE-yes Performed by: Mancel Bale  Nursing Notes Reviewed/ Care Coordinated Applicable Imaging Reviewed Interpretation of Laboratory Data incorporated into ED treatment  Nursing Notes Reviewed/ Care Coordinated Applicable Imaging Reviewed Interpretation of Laboratory Data incorporated into ED treatment   9:21 PM-Consult complete with hospitalist. Patient case explained and discussed.  She agrees to admit patient for further evaluation and treatment. Call ended at 9:52 PM  Plan: Admit   Final Clinical Impressions(s) / ED Diagnoses   Final diagnoses:  Pulmonary vascular congestion  Peripheral edema  Weight gain    ED Discharge Orders    None        Mancel Bale, MD 02/07/19 2159

## 2019-02-08 DIAGNOSIS — Z6841 Body Mass Index (BMI) 40.0 and over, adult: Secondary | ICD-10-CM | POA: Diagnosis not present

## 2019-02-08 DIAGNOSIS — I130 Hypertensive heart and chronic kidney disease with heart failure and stage 1 through stage 4 chronic kidney disease, or unspecified chronic kidney disease: Secondary | ICD-10-CM | POA: Diagnosis present

## 2019-02-08 DIAGNOSIS — Z888 Allergy status to other drugs, medicaments and biological substances status: Secondary | ICD-10-CM | POA: Diagnosis not present

## 2019-02-08 DIAGNOSIS — E8770 Fluid overload, unspecified: Secondary | ICD-10-CM | POA: Diagnosis not present

## 2019-02-08 DIAGNOSIS — J449 Chronic obstructive pulmonary disease, unspecified: Secondary | ICD-10-CM | POA: Diagnosis not present

## 2019-02-08 DIAGNOSIS — Z886 Allergy status to analgesic agent status: Secondary | ICD-10-CM | POA: Diagnosis not present

## 2019-02-08 DIAGNOSIS — Z9111 Patient's noncompliance with dietary regimen: Secondary | ICD-10-CM | POA: Diagnosis not present

## 2019-02-08 DIAGNOSIS — Z20828 Contact with and (suspected) exposure to other viral communicable diseases: Secondary | ICD-10-CM | POA: Diagnosis present

## 2019-02-08 DIAGNOSIS — J45909 Unspecified asthma, uncomplicated: Secondary | ICD-10-CM | POA: Diagnosis present

## 2019-02-08 DIAGNOSIS — F329 Major depressive disorder, single episode, unspecified: Secondary | ICD-10-CM | POA: Diagnosis present

## 2019-02-08 DIAGNOSIS — Z8711 Personal history of peptic ulcer disease: Secondary | ICD-10-CM | POA: Diagnosis not present

## 2019-02-08 DIAGNOSIS — N182 Chronic kidney disease, stage 2 (mild): Secondary | ICD-10-CM | POA: Diagnosis present

## 2019-02-08 DIAGNOSIS — K219 Gastro-esophageal reflux disease without esophagitis: Secondary | ICD-10-CM | POA: Diagnosis present

## 2019-02-08 DIAGNOSIS — I5082 Biventricular heart failure: Secondary | ICD-10-CM | POA: Diagnosis present

## 2019-02-08 DIAGNOSIS — Z7901 Long term (current) use of anticoagulants: Secondary | ICD-10-CM | POA: Diagnosis not present

## 2019-02-08 DIAGNOSIS — M1990 Unspecified osteoarthritis, unspecified site: Secondary | ICD-10-CM | POA: Diagnosis present

## 2019-02-08 DIAGNOSIS — Z8249 Family history of ischemic heart disease and other diseases of the circulatory system: Secondary | ICD-10-CM | POA: Diagnosis not present

## 2019-02-08 DIAGNOSIS — I428 Other cardiomyopathies: Secondary | ICD-10-CM | POA: Diagnosis present

## 2019-02-08 DIAGNOSIS — I5033 Acute on chronic diastolic (congestive) heart failure: Secondary | ICD-10-CM | POA: Diagnosis not present

## 2019-02-08 DIAGNOSIS — Z9114 Patient's other noncompliance with medication regimen: Secondary | ICD-10-CM | POA: Diagnosis not present

## 2019-02-08 DIAGNOSIS — G4733 Obstructive sleep apnea (adult) (pediatric): Secondary | ICD-10-CM | POA: Diagnosis not present

## 2019-02-08 DIAGNOSIS — I2729 Other secondary pulmonary hypertension: Secondary | ICD-10-CM | POA: Diagnosis present

## 2019-02-08 DIAGNOSIS — Z91018 Allergy to other foods: Secondary | ICD-10-CM | POA: Diagnosis not present

## 2019-02-08 DIAGNOSIS — E1122 Type 2 diabetes mellitus with diabetic chronic kidney disease: Secondary | ICD-10-CM | POA: Diagnosis present

## 2019-02-08 DIAGNOSIS — Z7951 Long term (current) use of inhaled steroids: Secondary | ICD-10-CM | POA: Diagnosis not present

## 2019-02-08 DIAGNOSIS — R0602 Shortness of breath: Secondary | ICD-10-CM | POA: Diagnosis present

## 2019-02-08 DIAGNOSIS — I482 Chronic atrial fibrillation, unspecified: Secondary | ICD-10-CM | POA: Diagnosis present

## 2019-02-08 LAB — BASIC METABOLIC PANEL
Anion gap: 9 (ref 5–15)
BUN: 12 mg/dL (ref 6–20)
CO2: 28 mmol/L (ref 22–32)
Calcium: 8.4 mg/dL — ABNORMAL LOW (ref 8.9–10.3)
Chloride: 104 mmol/L (ref 98–111)
Creatinine, Ser: 0.98 mg/dL (ref 0.61–1.24)
GFR calc Af Amer: >60 mL/min (ref >60)
GFR calc non Af Amer: >60 mL/min (ref >60)
Glucose, Bld: 95 mg/dL (ref 70–99)
Potassium: 3.6 mmol/L (ref 3.5–5.1)
Sodium: 141 mmol/L (ref 135–145)

## 2019-02-08 LAB — GLUCOSE, CAPILLARY
Glucose-Capillary: 83 mg/dL (ref 70–99)
Glucose-Capillary: 92 mg/dL (ref 70–99)
Glucose-Capillary: 94 mg/dL (ref 70–99)
Glucose-Capillary: 91 mg/dL (ref 70–99)

## 2019-02-08 LAB — TROPONIN I (HIGH SENSITIVITY)
Troponin I (High Sensitivity): 26 ng/L — ABNORMAL HIGH (ref <18)
Troponin I (High Sensitivity): 30 ng/L — ABNORMAL HIGH (ref <18)
Troponin I (High Sensitivity): 31 ng/L — ABNORMAL HIGH (ref <18)

## 2019-02-08 LAB — MAGNESIUM: Magnesium: 2.1 mg/dL (ref 1.7–2.4)

## 2019-02-08 LAB — SARS CORONAVIRUS 2 BY RT PCR (HOSPITAL ORDER, PERFORMED IN ~~LOC~~ HOSPITAL LAB): SARS Coronavirus 2: NEGATIVE

## 2019-02-08 MED ORDER — FUROSEMIDE 10 MG/ML IJ SOLN
80.0000 mg | Freq: Two times a day (BID) | INTRAMUSCULAR | Status: DC
  Administered 2019-02-08 – 2019-02-10 (×5): 80 mg via INTRAVENOUS
  Filled 2019-02-08 (×7): qty 8

## 2019-02-08 NOTE — Progress Notes (Signed)
PROGRESS NOTE    [name]  ZOX:[id number]  DOB: [birth year]  DOA: 02/07/2019 PCP: Jani Gravel, MD   Brief Admission Hx: 53 year old male with chronic diastolic CHF, pulmonary hypertension, atrial fibrillation and morbid obesity, poor compliance with multiple recent hospitalizations for CHF exacerbation presented with weight gain and shortness of breath.  MDM/Assessment & Plan:   1. Acute on chronic diastolic CHF exacerbation-patient reports a 10 pound weight gain and shortness of breath.  He was just discharged 01/11/2019 and admits to poor compliance with taking medications.  He refuses to take Lasix regularly at home.  Continue IV diuresis with IV Lasix at this time.  Monitor electrolytes and weights and clinical course. 2. Chronic atrial fibrillation- he is rate controlled and anticoagulated fully with Xarelto.  Continue carvedilol for rate control. 3. OSA- continue CPAP treatments. 4. Type 2 diabetes mellitus-monitoring CBG and sliding scale coverage. 5. Essential hypertension-resume home medications and follow and adjust and titrate medicines as needed. 6. Morbid obesity-dietitian consult requested.   DVT prophylaxis: xarelto Code Status: Full  Family Communication: patient updated bedside  Disposition Plan: inpatient for IV diuresis with lasix   Consultants:    Procedures:    Antimicrobials:     Subjective: Pt reports increased swelling in legs and reports that he is urinating now s/p IV lasix doses given in ED. No chest pain today.  Mild SOB.   Objective: Vitals:   02/08/19 0152 02/08/19 0402 02/08/19 0740 02/08/19 0815  BP: (!) 177/83 133/86  (!) 161/100  Pulse:  97  90  Resp:  (!) 28    Temp:      TempSrc:      SpO2:  92% 94%   Weight:      Height:        Intake/Output Summary (Last 24 hours) at 02/08/2019 1154 Last data filed at 02/08/2019 0900 Gross per 24 hour  Intake 240 ml  Output 2750 ml  Net -2510 ml   Filed Weights   02/07/19 1559  02/07/19 2235  Weight: (!) 170.6 kg (!) 166.8 kg     REVIEW OF SYSTEMS  As per history otherwise all reviewed and reported negative  Exam:  General exam: morbidly obese male lying in the bed he is awake and alert does not appear to be in distress.  He is cooperative for the exam. Respiratory system: Bibasilar crackles heard posteriorly. No increased work of breathing. Cardiovascular system: S1 & S2 heard. No JVD, murmurs, gallops, clicks or pedal edema. Gastrointestinal system: Abdomen is nondistended, soft and nontender. Normal bowel sounds heard. Central nervous system: Alert and oriented. No focal neurological deficits. Extremities: 3+ edema bilateral lower extremities taut to palpation.  Data Reviewed: Basic Metabolic Panel: Recent Labs  Lab 02/07/19 1650 02/07/19 2334 02/08/19 0137  NA 138  --  141  K 3.8  --  3.6  CL 107  --  104  CO2 24  --  28  GLUCOSE 99  --  95  BUN 13  --  12  CREATININE 1.02  --  0.98  CALCIUM 8.1*  --  8.4*  MG  --  2.1  --    Liver Function Tests: Recent Labs  Lab 02/07/19 1650  AST 18  ALT 15  ALKPHOS 50  BILITOT 1.3*  PROT 6.9  ALBUMIN 3.4*   No results for input(s): LIPASE, AMYLASE in the last 168 hours. No results for input(s): AMMONIA in the last 168 hours. CBC: Recent Labs  Lab 02/07/19 1650  WBC 5.4  NEUTROABS 4.1  HGB 12.2*  HCT 39.7  MCV 91.5  PLT 224   Cardiac Enzymes: No results for input(s): CKTOTAL, CKMB, CKMBINDEX, TROPONINI in the last 168 hours. CBG (last 3)  Recent Labs    02/08/19 0734 02/08/19 1131  GLUCAP 83 92   Recent Results (from the past 240 hour(s))  SARS CORONAVIRUS 2 Nasal Swab Aptima Multi Swab     Status: Abnormal   Collection Time: 02/07/19  4:09 PM   Specimen: Aptima Multi Swab; Nasal Swab  Result Value Ref Range Status   SARS Coronavirus 2 (A) NEGATIVE Final    INVALID, UNABLE TO DETERMINE THE PRESENCE OF TARGET DUE TO SPECIMEN INTEGRITY. RECOLLECTION REQUESTED.    Comment:  (NOTE) Presence or absence of SARS-CoV-2 nucleic acids cannot be determined. Repeat testing was performed on the submitted specimen and repeated Invalid results were obtained. If clinically indicated, additional testing on a new specimen with an alternate test methodology [id number]) is advised. The SARS-CoV-2 RNA is generally detectable in upper and lower respiratory specimens during the acute phase of infection. The expected result is Negative. Fact Sheet for Patients: HairSlick.nohttps://www.fda.gov/media/138098/download Fact Sheet for Healthcare Providers: quierodirigir.comhttps://www.fda.gov/media/138095/download This test is not yet approved or cleared by the [place] FDA and  has been authorized for detection and/or diagnosis of SARS-CoV-2 by FDA under an Emergency Use Authorization (EUA). This EUA will remain  in effect (meaning this test can be used) for the duration of the COVID-19 declaration under Section 564(b)(1) of the Act, 21 U.S.C. section 360bbb-3(b)(1), unless the authorizati on is terminated or revoked sooner. Performed at [facility], [address] [place], [place] [address]   SARS Coronavirus 2 [facility](Hospital order, Performed in [facility] lab) Nasopharyngeal Nasopharyngeal Swab     Status: None   Collection Time: 02/07/19 10:54 PM   Specimen: Nasopharyngeal Swab  Result Value Ref Range Status   SARS Coronavirus 2 NEGATIVE NEGATIVE Final    Comment: (NOTE) If result is NEGATIVE SARS-CoV-2 target nucleic acids are NOT DETECTED. The SARS-CoV-2 RNA is generally detectable in upper and lower  respiratory specimens during the acute phase of infection. The lowest  concentration of SARS-CoV-2 viral copies this assay can detect is 250  copies / mL. A negative result does not preclude SARS-CoV-2 infection  and should not be used as the sole basis for treatment or other  patient management decisions.  A negative result may occur with  improper specimen collection / handling,  submission of specimen other  than nasopharyngeal swab, presence of viral mutation(s) within the  areas targeted by this assay, and inadequate number of viral copies  (<250 copies / mL). A negative result must be combined with clinical  observations, patient history, and epidemiological information. If result is POSITIVE SARS-CoV-2 target nucleic acids are DETECTED. The SARS-CoV-2 RNA is generally detectable in upper and lower  respiratory specimens dur ing the acute phase of infection.  Positive  results are indicative of active infection with SARS-CoV-2.  Clinical  correlation with patient history and other diagnostic information is  necessary to determine patient infection status.  Positive results do  not rule out bacterial infection or co-infection with other viruses. If result is PRESUMPTIVE POSTIVE SARS-CoV-2 nucleic acids MAY BE PRESENT.   A presumptive positive result was obtained on the submitted specimen  and confirmed on repeat testing.  While 2019 novel coronavirus  (SARS-CoV-2) nucleic acids may be present in the submitted sample  additional confirmatory testing may be  necessary for epidemiological  and / or clinical management purposes  to differentiate between  SARS-CoV-2 and other Sarbecovirus currently known to infect humans.  If clinically indicated additional testing with an alternate test  methodology [phone]) is advised. The SARS-CoV-2 RNA is generally  detectable in upper and lower respiratory sp ecimens during the acute  phase of infection. The expected result is Negative. Fact Sheet for Patients:  BoilerBrush.com.cy Fact Sheet for Healthcare Providers: https://pope.com/ This test is not yet approved or cleared by the [place] FDA and has been authorized for detection and/or diagnosis of SARS-CoV-2 by FDA under an Emergency Use Authorization (EUA).  This EUA will remain in effect (meaning this test can be  used) for the duration of the COVID-19 declaration under Section 564(b)(1) of the Act, 21 U.S.C. section 360bbb-3(b)(1), unless the authorization is terminated or revoked sooner. Performed at [facility], [address] [place], [place] [address]      Studies: Dg Chest [facility] 1 View  Result Date: 02/07/2019 CLINICAL DATA:  Shortness of breath, nonproductive cough for 1 week, history CHF, asthma, hypertension, atrial fibrillation, non ischemic cardiomyopathy EXAM: PORTABLE CHEST 1 VIEW COMPARISON:  Portable exam 1624 hours compared to 01/09/2019 FINDINGS: Enlargement of cardiac silhouette with pulmonary vascular congestion. Mediastinal contour stable. Minimal pulmonary edema slightly greater on RIGHT. No pleural effusion or pneumothorax. Bones unremarkable. IMPRESSION: Mild chronic failure. Electronically Signed   By: Ulyses Southward M.D.   On: 02/07/2019 16:39     Scheduled Meds: . atorvastatin  40 mg Oral Daily  . budesonide  0.25 mg Nebulization BID  . carvedilol  25 mg Oral BID WC  . furosemide  80 mg Intravenous Q12H  . pantoprazole  40 mg Oral Q0600  . potassium chloride SA  40 mEq Oral Daily  . rivaroxaban  20 mg Oral Q supper  . tamsulosin  0.4 mg Oral Daily   Continuous Infusions:  Active Problems:   Acute on chronic diastolic CHF (congestive heart failure) (HCC)   Fluid overload   Time spent:   Standley Dakins, MD [facility] 02/08/2019, 11:54 AM    LOS: 0 days  How to contact the [facility] Attending or Consulting provider 7A - 7P or covering provider during after hours 7P -7A, for this patient?  1. Check the care team in [facility] and look for a) attending/consulting TRH provider listed and b) the [facility] team listed 2. Log into [email or web address] and use Yoe's universal password to access. If you do not have the password, please contact the hospital operator. 3. Locate the [facility] provider you are looking for under [facility] and page to a number that you can be directly  reached. 4. If you still have difficulty reaching the provider, please page the [facility] (Director on Call) for the Hospitalists listed on amion for assistance.

## 2019-02-08 NOTE — TOC Initial Note (Signed)
Transition of Care [facility]) - Initial/Assessment Note    Patient Details  Name: [name] MRN: [id number] Date of Birth: [birth year]  [facility]) CM/SW Contact:    Elliot Gault, LCSW Phone Number: 02/08/2019, 2:44 PM  Clinical Narrative:                  Pt admitted from home. Pt is high risk for readmission and is well known to [facility] team. Pt is frequently admitted to the hospital. He does not appear to follow treatment recommendations at home. Pt reports that the medicine he gets in the hospital is better than the medicine that he takes at home and that is the reason he ends up back in the hospital. [facility] CM very involved with outreach to patient though patient often does not take their calls.   Pt has been offered community services multiple times. So far, no interventions have been successful in helping pt reduce hospital admission. In fact, in a recent call with the [facility] CM, pt stated he was going to keep coming to the ED until they admitted him. Pt has been offered referral for outpatient behavioral health services by [facility] staff and he has refused.   TOC will follow and make referrals as deemed appropriate for pt prior to dc. Updated [facility] CM that pt was in the hospital again.   Expected Discharge Plan: Home/Self Care Barriers to Discharge: Continued Medical Work up   Patient Goals and CMS Choice        Expected Discharge Plan and Services Expected Discharge Plan: Home/Self Care       Living arrangements for the past 2 months: Single Family Home Expected Discharge Date: 02/11/19                                    Prior Living Arrangements/Services Living arrangements for the past 2 months: Single Family Home Lives with:: Parents Patient language and need for interpreter reviewed:: Yes Do you feel safe going back to the place where you live?: Yes      Need for Family Participation in Patient Care: No (Comment) Care giver support system in place?: Yes  (comment)   Criminal Activity/Legal Involvement Pertinent to Current Situation/Hospitalization: No - Comment as needed  Activities of Daily Living Home Assistive Devices/Equipment: None ADL Screening (condition at time of admission) Patient's cognitive ability adequate to safely complete daily activities?: Yes Is the patient deaf or have difficulty hearing?: No Does the patient have difficulty seeing, even when wearing glasses/contacts?: No Does the patient have difficulty concentrating, remembering, or making decisions?: No Patient able to express need for assistance with ADLs?: Yes Does the patient have difficulty dressing or bathing?: No Independently performs ADLs?: Yes (appropriate for developmental age) Does the patient have difficulty walking or climbing stairs?: No Weakness of Legs: None Weakness of Arms/Hands: None  Permission Sought/Granted                  Emotional Assessment Appearance:: Appears stated age Attitude/Demeanor/Rapport: Irrational Affect (typically observed): Flat Orientation: : Oriented to Self, Oriented to Place, Oriented to  Time, Oriented to Situation Alcohol / Substance Use: Not Applicable Psych Involvement: No (comment)  Admission diagnosis:  Weight gain [R63.5] Peripheral edema [R60.9] SOB (shortness of breath) [R06.02] Pulmonary vascular congestion [R09.89] Patient Active Problem List   Diagnosis Date Noted  . Fluid overload 02/07/2019  . Cellulitis of right leg   .  Acute diastolic CHF (congestive heart failure) ([place]) 12/15/2018  . Cellulitis 12/08/2018  . Chronic obstructive pulmonary disease ([place])   . Benign prostatic hyperplasia without lower urinary tract symptoms   . Acute on chronic combined systolic and diastolic CHF (congestive heart failure) ([place]) 10/02/2018  . AKI (acute kidney injury) ([place]) 09/18/2018  . Acute on chronic combined systolic and diastolic congestive heart failure ([place]) 08/15/2018  . SOB (shortness of breath)  08/03/2018  . Volume overload 07/22/2018  . Acute systolic CHF (congestive heart failure) ([place]) 07/04/2018  . Acute on chronic combined systolic and diastolic heart failure ([place]) 04/01/2018  . Hypomagnesemia 04/01/2018  . Acute on chronic diastolic CHF (congestive heart failure) ([place]) 03/11/2018  . Pressure injury of skin 03/11/2018  . CHF (congestive heart failure) ([place]) 09/08/2017  . Altered mental status 05/11/2017  . Acute exacerbation of CHF (congestive heart failure) ([place]) 04/20/2017  . Acute on chronic systolic (congestive) heart failure ([place]) 04/19/2017  . CHF exacerbation ([place]) 04/11/2017  . Chronic respiratory failure with hypoxia ([place]) 04/11/2017  . Encounter for hospice care discussion   . Palliative care encounter   . Goals of care, counseling/discussion   . DNR (do not resuscitate)   . DNR (do not resuscitate) discussion   . Chronic congestive heart failure ([place]) 02/18/2017  . Acute systolic heart failure ([place]) 01/18/2017  . Pedal edema 12/02/2016  . Acute CHF (congestive heart failure) ([place]) 11/09/2016  . Acute on chronic systolic CHF (congestive heart failure) ([place]) 08/19/2016  . Chronic renal failure, stage 2 (mild) 05/17/2016  . Coronary arteries, normal Dec 2016 01/23/2016  . Chronic anticoagulation-Xarelto 01/23/2016  . NSVT (nonsustained ventricular tachycardia) ([place]) 01/23/2016  . Elevated troponin 12/21/2015  . Nonischemic cardiomyopathy ([place])   . Obesity, Class III, BMI 40-49.9 (morbid obesity) ([place])   . Noncompliance with diet and medication regimen 12/02/2015  . OSA (obstructive sleep apnea) 09/20/2015  . Pulmonary hypertension ([place]) 09/19/2015  . Normocytic anemia 09/19/2015  . Atrial fibrillation, chronic   . Dyspnea on exertion 05/28/2015  . Acute respiratory failure with hypoxia ([place]) 04/01/2015  . Hypokalemia 04/01/2015  . Obesity hypoventilation syndrome ([place]) 08/08/2014  . GERD (gastroesophageal reflux disease) 08/08/2014  . Edema of right  lower extremity 06/21/2014  . Fecal urgency 06/21/2014  . Asthma 02/11/2009  . Hyperlipidemia 07/12/2008  . OBESITY, MORBID 07/12/2008  . Anxiety state 07/12/2008  . DEPRESSION 07/12/2008  . Essential hypertension 07/12/2008  . ALLERGIC RHINITIS 07/12/2008  . Peptic ulcer 07/12/2008  . OSTEOARTHRITIS 07/12/2008   PCP:  Jani Gravel, MD Pharmacy:   [id number] [place] [place] [address] [place] [address] Phone: [phone] Fax: [phone]     Social Determinants of Health (SDOH) Interventions    Readmission Risk Interventions Readmission Risk Prevention Plan 02/08/2019 12/16/2018 11/26/2018  Transportation Screening Complete Complete Complete  Medication Review Press photographer) Complete Complete Complete  PCP or Specialist appointment within 3-5 days of discharge - - Not Complete  HRI or [place] - Complete Complete  HRI or Home Care Consult Pt Refusal Comments - - -  SW Recovery Care/Counseling Consult - Complete Complete  Palliative Care Screening Not Applicable Not Applicable Not [place] Not Applicable Not Applicable Not Applicable  Some recent data might be hidden

## 2019-02-09 ENCOUNTER — Ambulatory Visit: Payer: Self-pay

## 2019-02-09 NOTE — TOC Progression Note (Signed)
Transition of Care [facility]) - Progression Note    Patient Details  Name: [name] MRN: [id number] Date of Birth: 09-10-1965  Transition of Care [facility]) CM/SW Contact  Nury Nebergall, Chauncey Reading, RN Phone Number: 02/09/2019, 2:22 PM  Clinical Narrative:   Patient reportedly interested CPAP at home. After chart review, it is noted that patient has a CPAP, discussed with patient, he reports only having oxygen at home. Reports he had a sleep study but it was "several years" ago, he is unsure where and exactly when he had the sleep study. He reports he is pretty sure he is supposed to have a CPAP but didn't follow up after sleep study. No sleep study results can be found in Epic. Patient will need to be referred for a sleep study post DC if CPAP is needed.     Expected Discharge Plan: Home/Self Care Barriers to Discharge: Continued Medical Work up  Expected Discharge Plan and Services Expected Discharge Plan: Home/Self Care       Living arrangements for the past 2 months: Single Family Home Expected Discharge Date: 02/11/19                                     Social Determinants of Health (SDOH) Interventions    Readmission Risk Interventions Readmission Risk Prevention Plan 02/08/2019 12/16/2018 11/26/2018  Transportation Screening Complete Complete Complete  Medication Review Press photographer) Complete Complete Complete  PCP or Specialist appointment within 3-5 days of discharge - - Not Complete  HRI or [place] - Complete Complete  HRI or Home Care Consult Pt Refusal Comments - - -  SW Recovery Care/Counseling Consult - Complete Complete  Palliative Care Screening Not Applicable Not Applicable Not [place] Not Applicable Not Applicable Not Applicable  Some recent data might be hidden

## 2019-02-09 NOTE — Progress Notes (Signed)
PROGRESS NOTE  [name]  PQD:[id number]  DOB: Oct 06, 1965  DOA: 02/07/2019 PCP: Pearson Grippe, MD   Brief Admission Hx: 53 year old male with chronic diastolic CHF, pulmonary hypertension, atrial fibrillation and morbid obesity, poor compliance with multiple recent hospitalizations for CHF exacerbation presented with weight gain and shortness of breath.  MDM/Assessment & Plan:   1. Acute on chronic diastolic CHF exacerbation-patient reported a 10 pound weight gain and shortness of breath.  He was just discharged 01/11/2019 and admits to poor compliance with taking medications.  He refuses to take Lasix regularly at home.  Continue IV diuresis with IV Lasix at this time.  Monitor electrolytes and weights and clinical course.  Following lytes and renal function closely.  2. Chronic atrial fibrillation- he is rate controlled and anticoagulated fully with Xarelto.  Continue carvedilol for rate control. 3. OSA- continue CPAP treatments. 4. Type 2 diabetes mellitus-monitoring CBG and sliding scale coverage. 5. Essential hypertension-resume home medications and follow and adjust and titrate medicines as needed. 6. Morbid obesity-dietitian consult requested.   DVT prophylaxis: xarelto Code Status: Full  Family Communication: patient updated bedside  Disposition Plan: inpatient for IV diuresis with lasix  Consultants:    Procedures:    Antimicrobials:     Subjective: Pt has SOB and reports he has not been ambulating that much.  Less edema in legs. Says CPAP helps him a lot and would wear it all day long if possible.   Objective: Vitals:   02/08/19 2053 02/08/19 2216 02/09/19 0500 02/09/19 0802  BP: 112/81  (!) 149/84   Pulse: 80  84   Resp: 20  (!) 22   Temp: 98.7 F (37.1 C)  98.3 F (36.8 C)   TempSrc: Oral  Oral   SpO2: 98% 98% 100% 94%  Weight:   (!) 160.7 kg   Height:        Intake/Output Summary (Last 24 hours) at 02/09/2019 1236 Last data filed at 02/09/2019 0900  Gross per 24 hour  Intake 770 ml  Output 2950 ml  Net -2180 ml   Filed Weights   02/07/19 1559 02/07/19 2235 02/09/19 0500  Weight: (!) 170.6 kg (!) 166.8 kg (!) 160.7 kg     REVIEW OF SYSTEMS  As per history otherwise all reviewed and reported negative  Exam:  General exam: morbidly obese male lying in the bed he is awake and alert does not appear to be in distress.  He is cooperative for the exam. Respiratory system: Bibasilar crackles heard posteriorly. No increased work of breathing. Cardiovascular system: S1 & S2 heard. No JVD, murmurs, gallops, clicks or pedal edema. Gastrointestinal system: Abdomen is nondistended, soft and nontender. Normal bowel sounds heard. Central nervous system: Alert and oriented. No focal neurological deficits. Extremities: 2+ edema bilateral lower extremities less taut to palpation.  Data Reviewed: Basic Metabolic Panel: Recent Labs  Lab 02/07/19 1650 02/07/19 2334 02/08/19 0137  NA 138  --  141  K 3.8  --  3.6  CL 107  --  104  CO2 24  --  28  GLUCOSE 99  --  95  BUN 13  --  12  CREATININE 1.02  --  0.98  CALCIUM 8.1*  --  8.4*  MG  --  2.1  --    Liver Function Tests: Recent Labs  Lab 02/07/19 1650  AST 18  ALT 15  ALKPHOS 50  BILITOT 1.3*  PROT 6.9  ALBUMIN 3.4*   No results for input(s): LIPASE, AMYLASE  in the last 168 hours. No results for input(s): AMMONIA in the last 168 hours. CBC: Recent Labs  Lab 02/07/19 1650  WBC 5.4  NEUTROABS 4.1  HGB 12.2*  HCT 39.7  MCV 91.5  PLT 224   Cardiac Enzymes: No results for input(s): CKTOTAL, CKMB, CKMBINDEX, TROPONINI in the last 168 hours. CBG (last 3)  Recent Labs    02/08/19 1131 02/08/19 1613 02/08/19 2057  GLUCAP 92 94 91   Recent Results (from the past 240 hour(s))  SARS CORONAVIRUS 2 Nasal Swab Aptima Multi Swab     Status: Abnormal   Collection Time: 02/07/19  4:09 PM   Specimen: Aptima Multi Swab; Nasal Swab  Result Value Ref Range Status   SARS  Coronavirus 2 (A) NEGATIVE Final    INVALID, UNABLE TO DETERMINE THE PRESENCE OF TARGET DUE TO SPECIMEN INTEGRITY. RECOLLECTION REQUESTED.    Comment: (NOTE) Presence or absence of SARS-CoV-2 nucleic acids cannot be determined. Repeat testing was performed on the submitted specimen and repeated Invalid results were obtained. If clinically indicated, additional testing on a new specimen with an alternate test methodology [id number]) is advised. The SARS-CoV-2 RNA is generally detectable in upper and lower respiratory specimens during the acute phase of infection. The expected result is Negative. Fact Sheet for Patients: HairSlick.nohttps://www.fda.gov/media/138098/download Fact Sheet for Healthcare Providers: quierodirigir.comhttps://www.fda.gov/media/138095/download This test is not yet approved or cleared by the [place] FDA and  has been authorized for detection and/or diagnosis of SARS-CoV-2 by FDA under an Emergency Use Authorization (EUA). This EUA will remain  in effect (meaning this test can be used) for the duration of the COVID-19 declaration under Section 564(b)(1) of the Act, 21 U.S.C. section 360bbb-3(b)(1), unless the authorizati on is terminated or revoked sooner. Performed at [facility], [address] [place], [place] [address]   SARS Coronavirus 2 [facility](Hospital order, Performed [facility]) Nasopharyngeal Nasopharyngeal Swab     Status: None   Collection Time: 02/07/19 10:54 PM   Specimen: Nasopharyngeal Swab  Result Value Ref Range Status   SARS Coronavirus 2 NEGATIVE NEGATIVE Final    Comment: (NOTE) If result is NEGATIVE SARS-CoV-2 target nucleic acids are NOT DETECTED. The SARS-CoV-2 RNA is generally detectable in upper and lower  respiratory specimens during the acute phase of infection. The lowest  concentration of SARS-CoV-2 viral copies this assay can detect is 250  copies / mL. A negative result does not preclude SARS-CoV-2 infection  and should not be used  as the sole basis for treatment or other  patient management decisions.  A negative result may occur with  improper specimen collection / handling, submission of specimen other  than nasopharyngeal swab, presence of viral mutation(s) within the  areas targeted by this assay, and inadequate number of viral copies  (<250 copies / mL). A negative result must be combined with clinical  observations, patient history, and epidemiological information. If result is POSITIVE SARS-CoV-2 target nucleic acids are DETECTED. The SARS-CoV-2 RNA is generally detectable in upper and lower  respiratory specimens dur ing the acute phase of infection.  Positive  results are indicative of active infection with SARS-CoV-2.  Clinical  correlation with patient history and other diagnostic information is  necessary to determine patient infection status.  Positive results do  not rule out bacterial infection or co-infection with other viruses. If result is PRESUMPTIVE POSTIVE SARS-CoV-2 nucleic acids MAY BE PRESENT.   A presumptive positive result was obtained on the submitted specimen  and  confirmed on repeat testing.  While 2019 novel coronavirus  (SARS-CoV-2) nucleic acids may be present in the submitted sample  additional confirmatory testing may be necessary for epidemiological  and / or clinical management purposes  to differentiate between  SARS-CoV-2 and other Sarbecovirus currently known to infect humans.  If clinically indicated additional testing with an alternate test  methodology [phone]) is advised. The SARS-CoV-2 RNA is generally  detectable in upper and lower respiratory sp ecimens during the acute  phase of infection. The expected result is Negative. Fact Sheet for Patients:  StrictlyIdeas.no Fact Sheet for Healthcare Providers: BankingDealers.co.za This test is not yet approved or cleared by the [place] FDA and has been authorized for  detection and/or diagnosis of SARS-CoV-2 by FDA under an Emergency Use Authorization (EUA).  This EUA will remain in effect (meaning this test can be used) for the duration of the COVID-19 declaration under Section 564(b)(1) of the Act, 21 U.S.C. section 360bbb-3(b)(1), unless the authorization is terminated or revoked sooner. Performed at [facility], [address] [place],  [address]      Studies: Dg Chest [facility] [address] 1 View  Result Date: 02/07/2019 CLINICAL DATA:  Shortness of breath, nonproductive cough for 1 week, history CHF, asthma, hypertension, atrial fibrillation, non ischemic cardiomyopathy EXAM: PORTABLE CHEST 1 VIEW COMPARISON:  Portable exam 1624 hours compared to 01/09/2019 FINDINGS: Enlargement of cardiac silhouette with pulmonary vascular congestion. Mediastinal contour stable. Minimal pulmonary edema slightly greater on RIGHT. No pleural effusion or pneumothorax. Bones unremarkable. IMPRESSION: Mild chronic failure. Electronically Signed   By: Lavonia Dana M.D.   On: 02/07/2019 16:39   Scheduled Meds: . atorvastatin  40 mg Oral Daily  . budesonide  0.25 mg Nebulization BID  . carvedilol  25 mg Oral BID WC  . furosemide  80 mg Intravenous Q12H  . pantoprazole  40 mg Oral Q0600  . potassium chloride SA  40 mEq Oral Daily  . rivaroxaban  20 mg Oral Q supper  . tamsulosin  0.4 mg Oral Daily   Continuous Infusions:  Active Problems:   Acute on chronic diastolic CHF (congestive heart failure) (HCC)   Fluid overload   Time spent:   Irwin Brakeman, MD [facility] 02/09/2019, 12:36 PM    LOS: 1 day  How to contact the [facility] Attending or Consulting provider [place] or covering provider during after hours [place], for this patient?  1. Check the care team in [facility] and look for a) attending/consulting [facility] provider listed and b) the [facility] team listed 2. Log into [email or web address] and use [place]'s universal password to access. If you do not have the password, please  contact the hospital operator. 3. Locate the [facility] provider you are looking for under [facility] and page to a number that you can be directly reached. 4. If you still have difficulty reaching the provider, please page the [facility] (Director on Call) for the Hospitalists listed on amion for assistance.

## 2019-02-09 NOTE — Progress Notes (Signed)
Patient states he does not need help with CPAP he states he is able to place self on when ready. Water chamber filled with sterile water. Patient aware to call if he has any trouble.

## 2019-02-10 DIAGNOSIS — G4733 Obstructive sleep apnea (adult) (pediatric): Secondary | ICD-10-CM

## 2019-02-10 LAB — GLUCOSE, CAPILLARY: Glucose-Capillary: 94 mg/dL (ref 70–99)

## 2019-02-10 MED ORDER — FERROUS GLUCONATE 324 (38 FE) MG PO TABS: 324.0000 mg | ORAL_TABLET | Freq: Every day | ORAL | Status: AC

## 2019-02-10 MED ORDER — DOCUSATE SODIUM 100 MG PO CAPS: 100.0000 mg | ORAL_CAPSULE | Freq: Every morning | ORAL | Status: AC

## 2019-02-10 MED ORDER — RIVAROXABAN 20 MG PO TABS: 20.0000 mg | ORAL_TABLET | Freq: Every day | ORAL | Status: AC

## 2019-02-10 MED ORDER — POTASSIUM CHLORIDE CRYS ER 20 MEQ PO TBCR: 20.0000 meq | EXTENDED_RELEASE_TABLET | Freq: Two times a day (BID) | ORAL | Status: AC

## 2019-02-10 NOTE — Discharge Summary (Signed)
Physician Discharge Summary  Samuel KlippelMark T Dineen ZOX:[id number] DOB: 07/29/1965 DOA: 02/07/2019  PCP: Pearson GrippeKim, [name], MD  Admit date: 02/07/2019 Discharge date: 02/10/2019  Admitted From:  HOME  Disposition: HOME   Recommendations for Outpatient Follow-up:  1. Follow up with PCP in 1 weeks 2. Follow up with cardiology in 1 month 3. Establish care with Dr. Gerilyn Pilgrimoonquah to arrange outpatient sleep study for home CPAP 4. Please obtain BMP/CBC in 2-4 weeks to follow lytes  Discharge Condition: STABLE   CODE STATUS: FULL    Brief Hospitalization Summary: Please see all hospital notes, images, labs for full details of the hospitalization. DISCHARGE WEIGHTS Filed Weights   02/07/19 2235 02/09/19 0500 02/10/19 0500  Weight: (!) 166.8 kg (!) 160.7 kg (!) 159.1 kg   HPI DR EMOKPAE:  HPI: [name] is a 53 y.o. male with medical history significant for morbid obesity BMI 50, atrial fibrillation, pulmonary hypertension, diastolic CHF, depression, presented to the ED with complaints of increasing difficulty breathing over the past week.  Reports cough productive of whitish sputum.  He denies chest pain.  No fever, but subjective chills.  Patient reports over the past week he is gained about 10 pounds-with leg swelling and abdominal bloating.  Tells me his stable weight is about 360 pounds, he weighs 374 pounds today.  Reports compliance with his Lasix 120mg  every 12hrs, he tells me that despite increased urination his legs continue to swell.  He is not compliant with a low-salt diet.  Hospitalization 7/20- 7/22-for decompensated CHF.  Patient discharged home on 120 mg Lasix twice daily, discharge weight was 352 pounds.  ED Course: Blood pressure systolic 150s to 811B180s, O2 sats greater than 91% on room air.  Creatinine 1.02.  Portable chest x-ray shows mild chronic failure.  SARS-CoV-2 test was invalid and recollection was requested.  Patient was given 120 mg of IV Lasix in the ED.  Hospitalist to admit for  decompensated CHF.  Brief Admission Hx: 53 year old male with chronic diastolic CHF, pulmonary hypertension, atrial fibrillation and morbid obesity, poor compliance with multiple recent hospitalizations for CHF exacerbation presented with weight gain and shortness of breath.  MDM/Assessment & Plan:   1. Acute on chronic diastolic CHF exacerbation-patient reported a 10 pound weight gain and shortness of breath.  He was just discharged 01/11/2019 and admits to poor compliance with taking medications.  He refuses to take Lasix regularly at home.  He was treated IV diuresis with IV Lasix 80 mg BID with very good results and stable electrolytes.  Weights trending down.  Now down to 159 Kg.  Pt says he feels much better and back to his baseline and wants to go home.  Actually he felt much better with CPAP usage.   2. Chronic atrial fibrillation- he is rate controlled and anticoagulated fully with Xarelto.  Continue carvedilol for rate control. 3. OSA- Pt did well with inpatient CPAP treatments.  He does not have one at home.  He needs a home CPAP. We were unable to get in hospital as he needs a sleep study done.  I referred him for outpatient sleep study and follow up with Dr. Gerilyn Pilgrimoonquah.  I think he would have less hospitalizations if he had home CPAP.  He would use it frequently and regularly at home as it makes him feel better.   4. Type 2 diabetes mellitus-monitoring CBG and sliding scale coverage. 5. Essential hypertension-resume home medications and follow and adjust and titrate medicines as needed. 6. Morbid obesity-weight loss  and treating OSA strongly counseled.   DVT prophylaxis: xarelto Code Status: Full  Family Communication: patient updated bedside  Disposition Plan: home   Discharge Diagnoses:  Active Problems:   Acute on chronic diastolic CHF (congestive heart failure) (HCC)   Fluid overload   Discharge Instructions: Discharge Instructions    (HEART FAILURE PATIENTS) Call MD:   Anytime you have any of the following symptoms: 1) 3 pound weight gain in 24 hours or 5 pounds in 1 week 2) shortness of breath, with or without a dry hacking cough 3) swelling in the hands, feet or stomach 4) if you have to sleep on extra pillows at night in order to breathe.   Complete by: As directed    Ambulatory referral to Sleep Studies   Complete by: As directed    Diet - low sodium heart healthy   Complete by: As directed    Increase activity slowly   Complete by: As directed      Allergies as of 02/10/2019      Reactions   Banana Shortness Of Breath   Bee Venom Shortness Of Breath, Swelling, Other (See Comments)   Reaction:  Facial swelling   Strawberry Flavor Shortness Of Breath, Rash   Aspirin Other (See Comments)   Reaction:  GI upset    Metolazone Other (See Comments)   Pt states that he stopped taking this med due to heart attack like symptoms.    Oatmeal Nausea And Vomiting   Orange Juice [orange Oil] Nausea And Vomiting   All acidic products make him nauseous and upset stomach   Torsemide Swelling, Other (See Comments)   Reaction:  Swelling of feet/legs    Diltiazem Palpitations   Hydralazine Palpitations   Lipitor [atorvastatin] Other (See Comments)   Reaction:  Nose bleeds : Patient is currently taking this medication      Medication List    STOP taking these medications   silver sulfADIAZINE 1 % cream Commonly known as: SILVADENE     TAKE these medications   acetaminophen 325 MG tablet Commonly known as: TYLENOL Take 2 tablets (650 mg total) by mouth every 4 (four) hours as needed for headache or mild pain.   albuterol (2.5 MG/3ML) 0.083% nebulizer solution Commonly known as: PROVENTIL Take 2.5 mg by nebulization every 6 (six) hours as needed for wheezing or shortness of breath.   atorvastatin 40 MG tablet Commonly known as: LIPITOR Take 40 mg by mouth daily.   carvedilol 25 MG tablet Commonly known as: COREG Take 1 tablet (25 mg total) by mouth  2 (two) times daily with a meal for 30 days.   docusate sodium 100 MG capsule Commonly known as: COLACE Take 1 capsule (100 mg total) by mouth every morning.   ferrous gluconate 324 MG tablet Commonly known as: FERGON Take 1 tablet (324 mg total) by mouth daily.   fluticasone 220 MCG/ACT inhaler Commonly known as: FLOVENT HFA Inhale 2 puffs into the lungs every evening.   furosemide 80 MG tablet Commonly known as: LASIX Take 1.5 tablets (120 mg total) by mouth 2 (two) times daily.   lisinopril 20 MG tablet Commonly known as: ZESTRIL Take 20 mg by mouth daily.   pantoprazole 40 MG tablet Commonly known as: PROTONIX Take 1 tablet (40 mg total) by mouth daily at 6 (six) AM.   potassium chloride SA 20 MEQ tablet Commonly known as: K-DUR Take 1 tablet (20 mEq total) by mouth 2 (two) times daily.   rivaroxaban 20 MG  Tabs tablet Commonly known as: XARELTO Take 1 tablet (20 mg total) by mouth daily with supper.   spironolactone 100 MG tablet Commonly known as: ALDACTONE Take 0.5 tablets (50 mg total) by mouth daily. For fluid   tamsulosin 0.4 MG Caps capsule Commonly known as: FLOMAX Take 1 capsule (0.4 mg total) by mouth daily.      Follow-up Information    Jani Gravel, MD. Schedule an appointment as soon as possible for a visit in 5 day(s).   Specialty: Internal Medicine Contact information: [address] [place] [address] [phone]        Herminio Commons, MD. Schedule an appointment as soon as possible for a visit in 2 week(s).   Specialty: Cardiology Contact information: [place] [address] [phone]        Phillips Odor, MD. Schedule an appointment as soon as possible for a visit in 1 week(s).   Specialty: Neurology Why: Arrange for outpatient sleep study Contact information: [address] [place] [address] [phone]          Allergies  Allergen Reactions  . Banana Shortness Of Breath   . Bee Venom Shortness Of Breath, Swelling and Other (See Comments)    Reaction:  Facial swelling  . Strawberry Flavor Shortness Of Breath and Rash  . Aspirin Other (See Comments)    Reaction:  GI upset   . Metolazone Other (See Comments)    Pt states that he stopped taking this med due to heart attack like symptoms.   . Oatmeal Nausea And Vomiting  . Orange Juice [Orange Oil] Nausea And Vomiting    All acidic products make him nauseous and upset stomach  . Torsemide Swelling and Other (See Comments)    Reaction:  Swelling of feet/legs   . Diltiazem Palpitations  . Hydralazine Palpitations  . Lipitor [Atorvastatin] Other (See Comments)    Reaction:  Nose bleeds : Patient is currently taking this medication   Allergies as of 02/10/2019      Reactions   Banana Shortness Of Breath   Bee Venom Shortness Of Breath, Swelling, Other (See Comments)   Reaction:  Facial swelling   Strawberry Flavor Shortness Of Breath, Rash   Aspirin Other (See Comments)   Reaction:  GI upset    Metolazone Other (See Comments)   Pt states that he stopped taking this med due to heart attack like symptoms.    Oatmeal Nausea And Vomiting   Orange Juice [orange Oil] Nausea And Vomiting   All acidic products make him nauseous and upset stomach   Torsemide Swelling, Other (See Comments)   Reaction:  Swelling of feet/legs    Diltiazem Palpitations   Hydralazine Palpitations   Lipitor [atorvastatin] Other (See Comments)   Reaction:  Nose bleeds : Patient is currently taking this medication      Medication List    STOP taking these medications   silver sulfADIAZINE 1 % cream Commonly known as: SILVADENE     TAKE these medications   acetaminophen 325 MG tablet Commonly known as: TYLENOL Take 2 tablets (650 mg total) by mouth every 4 (four) hours as needed for headache or mild pain.   albuterol (2.5 MG/3ML) 0.083% nebulizer solution Commonly known as: PROVENTIL Take 2.5 mg by nebulization every 6 (six)  hours as needed for wheezing or shortness of breath.   atorvastatin 40 MG tablet Commonly known as: LIPITOR Take 40 mg by mouth daily.   carvedilol 25  MG tablet Commonly known as: COREG Take 1 tablet (25 mg total) by mouth 2 (two) times daily with a meal for 30 days.   docusate sodium 100 MG capsule Commonly known as: COLACE Take 1 capsule (100 mg total) by mouth every morning.   ferrous gluconate 324 MG tablet Commonly known as: FERGON Take 1 tablet (324 mg total) by mouth daily.   fluticasone 220 MCG/ACT inhaler Commonly known as: FLOVENT HFA Inhale 2 puffs into the lungs every evening.   furosemide 80 MG tablet Commonly known as: LASIX Take 1.5 tablets (120 mg total) by mouth 2 (two) times daily.   lisinopril 20 MG tablet Commonly known as: ZESTRIL Take 20 mg by mouth daily.   pantoprazole 40 MG tablet Commonly known as: PROTONIX Take 1 tablet (40 mg total) by mouth daily at 6 (six) AM.   potassium chloride SA 20 MEQ tablet Commonly known as: K-DUR Take 1 tablet (20 mEq total) by mouth 2 (two) times daily.   rivaroxaban 20 MG Tabs tablet Commonly known as: XARELTO Take 1 tablet (20 mg total) by mouth daily with supper.   spironolactone 100 MG tablet Commonly known as: ALDACTONE Take 0.5 tablets (50 mg total) by mouth daily. For fluid   tamsulosin 0.4 MG Caps capsule Commonly known as: FLOMAX Take 1 capsule (0.4 mg total) by mouth daily.      Procedures/Studies: Dg Chest Port 1 View  Result Date: 02/07/2019 CLINICAL DATA:  Shortness of breath, nonproductive cough for 1 week, history CHF, asthma, hypertension, atrial fibrillation, non ischemic cardiomyopathy EXAM: PORTABLE CHEST 1 VIEW COMPARISON:  Portable exam 1624 hours compared to 01/09/2019 FINDINGS: Enlargement of cardiac silhouette with pulmonary vascular congestion. Mediastinal contour stable. Minimal pulmonary edema slightly greater on RIGHT. No pleural effusion or pneumothorax. Bones unremarkable.  IMPRESSION: Mild chronic failure. Electronically Signed   By: Ulyses Southward M.D.   On: 02/07/2019 16:39     Subjective: Pt says that he is feeling back to his baseline and really feels better using the CPAP.  He says he would wear it all day if he could.  He would welcome and be grateful to have a home CPAP.  He feels well to go home.   Discharge Exam: Vitals:   02/10/19 0501 02/10/19 0802  BP: (!) 122/98   Pulse: 63   Resp: 18   Temp: 98.2 F (36.8 C)   SpO2: 97% 99%   Vitals:   02/09/19 2145 02/10/19 0500 02/10/19 0501 02/10/19 0802  BP: 123/84  (!) 122/98   Pulse: 62  63   Resp: 17  18   Temp: 98.5 F (36.9 C)  98.2 F (36.8 C)   TempSrc:   Oral   SpO2: 96%  97% 99%  Weight:  (!) 159.1 kg    Height:       General exam: morbidly obese male lying in the bed he is awake and alert does not appear to be in distress.  He is cooperative for the exam. Respiratory system: Bibasilar crackles heard posteriorly. No increased work of breathing. Cardiovascular system: S1 & S2 heard. No JVD, murmurs, gallops, clicks or pedal edema. Gastrointestinal system: Abdomen is nondistended, soft and nontender. Normal bowel sounds heard. Central nervous system: Alert and oriented. No focal neurological deficits. Extremities: 1+ edema bilateral lower extremities, much improved from prior exams.   The results of significant diagnostics from this hospitalization (including imaging, microbiology, ancillary and laboratory) are listed below for reference.     Microbiology: Recent Results (  from the past 240 hour(s))  SARS CORONAVIRUS 2 Nasal Swab Aptima Multi Swab     Status: Abnormal   Collection Time: 02/07/19  4:09 PM   Specimen: Aptima Multi Swab; Nasal Swab  Result Value Ref Range Status   SARS Coronavirus 2 (A) NEGATIVE Final    INVALID, UNABLE TO DETERMINE THE PRESENCE OF TARGET DUE TO SPECIMEN INTEGRITY. RECOLLECTION REQUESTED.    Comment: (NOTE) Presence or absence of SARS-CoV-2 nucleic  acids cannot be determined. Repeat testing was performed on the submitted specimen and repeated Invalid results were obtained. If clinically indicated, additional testing on a new specimen with an alternate test methodology [phone]) is advised. The SARS-CoV-2 RNA is generally detectable in upper and lower respiratory specimens during the acute phase of infection. The expected result is Negative. Fact Sheet for Patients: HairSlick.no Fact Sheet for Healthcare Providers: quierodirigir.com This test is not yet approved or cleared by the [place] FDA and  has been authorized for detection and/or diagnosis of SARS-CoV-2 by FDA under an Emergency Use Authorization (EUA). This EUA will remain  in effect (meaning this test can be used) for the duration of the COVID-19 declaration under Section 564(b)(1) of the Act, 21 U.S.C. section 360bbb-3(b)(1), unless the authorizati on is terminated or revoked sooner. Performed at [facility], [address], [place], [place] [address]   SARS Coronavirus 2 [facility] order, Performed in [facility] hospital lab) Nasopharyngeal Nasopharyngeal Swab     Status: None   Collection Time: 02/07/19 10:54 PM   Specimen: Nasopharyngeal Swab  Result Value Ref Range Status   SARS Coronavirus 2 NEGATIVE NEGATIVE Final    Comment: (NOTE) If result is NEGATIVE SARS-CoV-2 target nucleic acids are NOT DETECTED. The SARS-CoV-2 RNA is generally detectable in upper and lower  respiratory specimens during the acute phase of infection. The lowest  concentration of SARS-CoV-2 viral copies this assay can detect is 250  copies / mL. A negative result does not preclude SARS-CoV-2 infection  and should not be used as the sole basis for treatment or other  patient management decisions.  A negative result may occur with  improper specimen collection / handling, submission of specimen other  than nasopharyngeal  swab, presence of viral mutation(s) within the  areas targeted by this assay, and inadequate number of viral copies  (<250 copies / mL). A negative result must be combined with clinical  observations, patient history, and epidemiological information. If result is POSITIVE SARS-CoV-2 target nucleic acids are DETECTED. The SARS-CoV-2 RNA is generally detectable in upper and lower  respiratory specimens dur ing the acute phase of infection.  Positive  results are indicative of active infection with SARS-CoV-2.  Clinical  correlation with patient history and other diagnostic information is  necessary to determine patient infection status.  Positive results do  not rule out bacterial infection or co-infection with other viruses. If result is PRESUMPTIVE POSTIVE SARS-CoV-2 nucleic acids MAY BE PRESENT.   A presumptive positive result was obtained on the submitted specimen  and confirmed on repeat testing.  While 2019 novel coronavirus  (SARS-CoV-2) nucleic acids may be present in the submitted sample  additional confirmatory testing may be necessary for epidemiological  and / or clinical management purposes  to differentiate between  SARS-CoV-2 and other Sarbecovirus currently known to infect humans.  If clinically indicated additional testing with an alternate test  methodology [phone]) is advised. The SARS-CoV-2 RNA is generally  detectable in upper and lower respiratory sp ecimens during the  acute  phase of infection. The expected result is Negative. Fact Sheet for Patients:  BoilerBrush.com.cy Fact Sheet for Healthcare Providers: https://pope.com/ This test is not yet approved or cleared by the [place] FDA and has been authorized for detection and/or diagnosis of SARS-CoV-2 by FDA under an Emergency Use Authorization (EUA).  This EUA will remain in effect (meaning this test can be used) for the duration of the COVID-19 declaration  under Section 564(b)(1) of the Act, 21 U.S.C. section 360bbb-3(b)(1), unless the authorization is terminated or revoked sooner. Performed at [facility], [address] [place], [place] [address]      Labs: BNP (last 3 results) Recent Labs    01/03/19 0315 01/09/19 0830 02/07/19 1650  BNP 120.0* 408.0* 288.0*   Basic Metabolic Panel: Recent Labs  Lab 02/07/19 1650 02/07/19 2334 02/08/19 0137  NA 138  --  141  K 3.8  --  3.6  CL 107  --  104  CO2 24  --  28  GLUCOSE 99  --  95  BUN 13  --  12  CREATININE 1.02  --  0.98  CALCIUM 8.1*  --  8.4*  MG  --  2.1  --    Liver Function Tests: Recent Labs  Lab 02/07/19 1650  AST 18  ALT 15  ALKPHOS 50  BILITOT 1.3*  PROT 6.9  ALBUMIN 3.4*   No results for input(s): LIPASE, AMYLASE in the last 168 hours. No results for input(s): AMMONIA in the last 168 hours. CBC: Recent Labs  Lab 02/07/19 1650  WBC 5.4  NEUTROABS 4.1  HGB 12.2*  HCT 39.7  MCV 91.5  PLT 224   Cardiac Enzymes: No results for input(s): CKTOTAL, CKMB, CKMBINDEX, TROPONINI in the last 168 hours. BNP: Invalid input(s): POCBNP CBG: Recent Labs  Lab 02/08/19 0734 02/08/19 1131 02/08/19 1613 02/08/19 2057 02/10/19 0459  GLUCAP 83 92 94 91 94   D-Dimer No results for input(s): DDIMER in the last 72 hours. Hgb A1c No results for input(s): HGBA1C in the last 72 hours. Lipid Profile No results for input(s): CHOL, HDL, LDLCALC, TRIG, CHOLHDL, LDLDIRECT in the last 72 hours. Thyroid function studies No results for input(s): TSH, T4TOTAL, T3FREE, THYROIDAB in the last 72 hours.  Invalid input(s): FREET3 Anemia work up No results for input(s): VITAMINB12, FOLATE, FERRITIN, TIBC, IRON, RETICCTPCT in the last 72 hours. Urinalysis    Component Value Date/Time   COLORURINE YELLOW 02/07/2019 1811   APPEARANCEUR CLEAR 02/07/2019 1811   LABSPEC 1.025 02/07/2019 1811   PHURINE 5.0 02/07/2019 1811   GLUCOSEU NEGATIVE 02/07/2019 1811   GLUCOSEU  NEG mg/dL 60/45/4098 1191   HGBUR NEGATIVE 02/07/2019 1811   BILIRUBINUR NEGATIVE 02/07/2019 1811   KETONESUR NEGATIVE 02/07/2019 1811   PROTEINUR 100 (A) 02/07/2019 1811   UROBILINOGEN 0.2 08/08/2014 1445   NITRITE NEGATIVE 02/07/2019 1811   LEUKOCYTESUR NEGATIVE 02/07/2019 1811   Sepsis Labs Invalid input(s): PROCALCITONIN,  WBC,  LACTICIDVEN Microbiology Recent Results (from the past 240 hour(s))  SARS CORONAVIRUS 2 Nasal Swab Aptima Multi Swab     Status: Abnormal   Collection Time: 02/07/19  4:09 PM   Specimen: Aptima Multi Swab; Nasal Swab  Result Value Ref Range Status   SARS Coronavirus 2 (A) NEGATIVE Final    INVALID, UNABLE TO DETERMINE THE PRESENCE OF TARGET DUE TO SPECIMEN INTEGRITY. RECOLLECTION REQUESTED.    Comment: (NOTE) Presence or absence of SARS-CoV-2 nucleic acids cannot be determined. Repeat testing was performed on the submitted  specimen and repeated Invalid results were obtained. If clinically indicated, additional testing on a new specimen with an alternate test methodology [phone]) is advised. The SARS-CoV-2 RNA is generally detectable in upper and lower respiratory specimens during the acute phase of infection. The expected result is Negative. Fact Sheet for Patients: HairSlick.no Fact Sheet for Healthcare Providers: quierodirigir.com This test is not yet approved or cleared by the [place] FDA and  has been authorized for detection and/or diagnosis of SARS-CoV-2 by FDA under an Emergency Use Authorization (EUA). This EUA will remain  in effect (meaning this test can be used) for the duration of the COVID-19 declaration under Section 564(b)(1) of the Act, 21 U.S.C. section 360bbb-3(b)(1), unless the authorizati on is terminated or revoked sooner. Performed at [facility], [address], [place], [place] [address]   SARS Coronavirus 2 [facility] order, Performed in [facility]  hospital lab) Nasopharyngeal Nasopharyngeal Swab     Status: None   Collection Time: 02/07/19 10:54 PM   Specimen: Nasopharyngeal Swab  Result Value Ref Range Status   SARS Coronavirus 2 NEGATIVE NEGATIVE Final    Comment: (NOTE) If result is NEGATIVE SARS-CoV-2 target nucleic acids are NOT DETECTED. The SARS-CoV-2 RNA is generally detectable in upper and lower  respiratory specimens during the acute phase of infection. The lowest  concentration of SARS-CoV-2 viral copies this assay can detect is 250  copies / mL. A negative result does not preclude SARS-CoV-2 infection  and should not be used as the sole basis for treatment or other  patient management decisions.  A negative result may occur with  improper specimen collection / handling, submission of specimen other  than nasopharyngeal swab, presence of viral mutation(s) within the  areas targeted by this assay, and inadequate number of viral copies  (<250 copies / mL). A negative result must be combined with clinical  observations, patient history, and epidemiological information. If result is POSITIVE SARS-CoV-2 target nucleic acids are DETECTED. The SARS-CoV-2 RNA is generally detectable in upper and lower  respiratory specimens dur ing the acute phase of infection.  Positive  results are indicative of active infection with SARS-CoV-2.  Clinical  correlation with patient history and other diagnostic information is  necessary to determine patient infection status.  Positive results do  not rule out bacterial infection or co-infection with other viruses. If result is PRESUMPTIVE POSTIVE SARS-CoV-2 nucleic acids MAY BE PRESENT.   A presumptive positive result was obtained on the submitted specimen  and confirmed on repeat testing.  While 2019 novel coronavirus  (SARS-CoV-2) nucleic acids may be present in the submitted sample  additional confirmatory testing may be necessary for epidemiological  and / or clinical management  purposes  to differentiate between  SARS-CoV-2 and other Sarbecovirus currently known to infect humans.  If clinically indicated additional testing with an alternate test  methodology [phone]) is advised. The SARS-CoV-2 RNA is generally  detectable in upper and lower respiratory sp ecimens during the acute  phase of infection. The expected result is Negative. Fact Sheet for Patients:  BoilerBrush.com.cy Fact Sheet for Healthcare Providers: https://pope.com/ This test is not yet approved or cleared by the [place] FDA and has been authorized for detection and/or diagnosis of SARS-CoV-2 by FDA under an Emergency Use Authorization (EUA).  This EUA will remain in effect (meaning this test can be used) for the duration of the COVID-19 declaration under Section 564(b)(1) of the Act, 21 U.S.C. section 360bbb-3(b)(1), unless the authorization is terminated or  revoked sooner. Performed at [facility], [address] [place], [place] [id number]    Time coordinating discharge: 33 mins  SIGNED:  Standley Dakinslanford Tavonte Seybold, MD  [facility] 02/10/2019, 10:56 AM How to contact the [facility] Attending or Consulting provider 7A - 7P or covering provider during after hours 7P -7A, for this patient?  1. Check the care [facility] in [facility] and look for a) attending/consulting [facility] provider listed and b) the [facility] team listed 2. Log into [email or web address] and use [place]'s universal password to access. If you do not have the password, please contact the hospital operator. 3. Locate the [facility] provider you are looking for under [facility] and page to a number that you can be directly reached. 4. If you still have difficulty reaching the provider, please page the [facility] (Director on Call) for the Hospitalists listed on amion for assistance.

## 2019-02-10 NOTE — Discharge Instructions (Signed)
PLEASE FOLLOW THROUGH WITH GETTING SLEEP STUDY SO YOU CAN GET A HOME CPAP MACHINE ARRANGED.  A REFERRAL WAS MADE FOR YOU AND YOU SHOULD CALL DR. DOONQUAH'S OFFICE FOR APPT FOR SLEEP STUDY IF  YOU HAVE NOT HEARD SOMETHING IN NEXT WEEK.    Sleep Studies A sleep study (polysomnogram) is a series of tests done while you are sleeping. A sleep study records your brain waves, heart rate, breathing rate, oxygen level, and eye and leg movements. A sleep study helps your health care provider:  See how well you sleep.  Diagnose a sleep disorder.  Determine how severe your sleep disorder is.  Create a plan to treat your sleep disorder. Your health care provider may recommend a sleep study if you:  Feel sleepy on most days.  Snore loudly while sleeping.  Have unusual behaviors while you sleep, such as walking.  Have brief periods in which you stop breathing during sleep (sleepapnea).  Fall asleep suddenly during the day (narcolepsy).  Have trouble falling asleep or staying asleep (insomnia).  Feel like you need to move your legs when trying to fall asleep (restless legs syndrome).  Move your legs by flexing and extending them regularly while asleep (periodic limb movement disorder).  Act out your dreams while you sleep (sleep behavior disorder).  Feel like you cannot move when you first wake up (sleep paralysis). What tests are part of a sleep study? Most sleep studies record the following during sleep:  Brain activity.  Eye movements.  Heart rate and rhythm.  Breathing rate and rhythm.  Blood-oxygen level.  Blood pressure.  Chest and belly movement as you breathe.  Arm and leg movements.  Snoring or other noises.  Body position. Where are sleep studies done? Sleep studies are done at sleep centers. A sleep center may be inside a hospital, office, or clinic. The room where you have the study may look like a hospital room or a hotel room. The health care providers doing  the study may come in and out of the room during the study. Most of the time, they will be in another room monitoring your test as you sleep. How are sleep studies done? Most sleep studies are done during a normal period of time for a full night of sleep. You will arrive at the study center in the evening and go home in the morning. Before the test  Bring your pajamas and toothbrush with you to the sleep study.  Do not have caffeine on the day of your sleep study.  Do not drink alcohol on the day of your sleep study.  Your health care provider will let you know if you should stop taking any of your regular medicines before the test. During the test      Round, sticky patches with sensors attached to recording wires (electrodes) are placed on your scalp, face, chest, and limbs.  Wires from all the electrodes and sensors run from your bed to a computer. The wires can be taken off and put back on if you need to get out of bed to go to the bathroom.  A sensor is placed over your nose to measure airflow.  A finger clip is put on your finger or ear to measure your blood oxygen level (pulse oximetry).  A belt is placed around your belly and a belt is placed around your chest to measure breathing movements.  If you have signs of the sleep disorder called sleep apnea during your test, you  may get a treatment mask to wear for the second half of the night. ? The mask provides positive airway pressure (PAP) to help you breathe better during sleep. This may greatly improve your sleep apnea. ? You will then have all tests done again with the mask in place to see if your measurements and recordings change. After the test  A medical doctor who specializes in sleep will evaluate the results of your sleep study and share them with you and your primary health care provider.  Based on your results, your medical history, and a physical exam, you may be diagnosed with a sleep disorder, such as: ? Sleep  apnea. ? Restless legs syndrome. ? Sleep-related behavior disorder. ? Sleep-related movement disorders. ? Sleep-related seizure disorders.  Your health care team will help determine your treatment options based on your diagnosis. This may include: ? Improving your sleep habits (sleep hygiene). ? Wearing a continuous positive airway pressure (CPAP) or bi-level positive airway pressure (BPAP) mask. ? Wearing an oral device at night to improve breathing and reduce snoring. ? Taking medicines. Follow these instructions at home:  Take over-the-counter and prescription medicines only as told by your health care provider.  If you are instructed to use a CPAP or BPAP mask, make sure you use it nightly as directed.  Make any lifestyle changes that your health care provider recommends.  If you were given a device to open your airway while you sleep, use it only as told by your health care provider.  Do not use any tobacco products, such as cigarettes, chewing tobacco, and e-cigarettes. If you need help quitting, ask your health care provider.  Keep all follow-up visits as told by your health care provider. This is important. Summary  A sleep study (polysomnogram) is a series of tests done while you are sleeping. It shows how well you sleep.  Most sleep studies are done over one full night of sleep. You will arrive at the study center in the evening and go home in the morning.  If you have signs of the sleep disorder called sleep apnea during your test, you may get a treatment mask to wear for the second half of the night.  A medical doctor who specializes in sleep will evaluate the results of your sleep study and share them with your primary health care provider. This information is not intended to replace advice given to you by your health care provider. Make sure you discuss any questions you have with your health care provider.     Screening for Sleep Apnea  Sleep apnea is a  condition in which breathing pauses or becomes shallow during sleep. Sleep apnea screening is a test to determine if you are at risk for sleep apnea. The test is easy and only takes a few minutes. Your health care provider may ask you to have this test in preparation for surgery or as part of a physical exam. What are the symptoms of sleep apnea? Common symptoms of sleep apnea include:  Snoring.  Restless sleep.  Daytime sleepiness.  Pauses in breathing.  Choking during sleep.  Irritability.  Forgetfulness.  Trouble thinking clearly.  Depression.  Personality changes. Most people with sleep apnea are not aware that they have it. Why should I get screened? Getting screened for sleep apnea can help:  Ensure your safety. It is important for your health care providers to know whether or not you have sleep apnea, especially if you are having surgery or have  other long-term (chronic) health conditions.  Improve your health and allow you to get a better night's rest. Restful sleep can help you: ? Have more energy. ? Lose weight. ? Improve high blood pressure. ? Improve diabetes management. ? Prevent stroke. ? Prevent car accidents. How is screening done? Screening usually includes being asked a list of questions about your sleep quality. Some questions you may be asked include:  Do you snore?  Is your sleep restless?  Do you have daytime sleepiness?  Has a partner or spouse told you that you stop breathing during sleep?  Have you had trouble concentrating or memory loss? If your screening test is positive, you are at risk for the condition. Further testing may be needed to confirm a diagnosis of sleep apnea. Where to find more information You can find screening tools online or at your health care clinic. For more information about sleep apnea screening and healthy sleep, visit these websites:  Centers for Disease Control and Prevention:  DetailSports.iswww.cdc.gov/sleep/index.html  American Sleep Apnea Association: www.sleepapnea.org Contact a health care provider if:  You think that you may have sleep apnea. Summary  Sleep apnea screening can help determine if you are at risk for sleep apnea.  It is important for your health care providers to know whether or not you have sleep apnea, especially if you are having surgery or have other chronic health conditions.  You may be asked to take a screening test for sleep apnea in preparation for surgery or as part of a physical exam. This information is not intended to replace advice given to you by your health care provider. Make sure you discuss any questions you have with your health care provider. Document Released: 09/18/2016 Document Revised: 03/25/2018 Document Reviewed: 09/18/2016 Elsevier Patient Education  2020 Elsevier Inc.  Document Released: 12/13/2002 Document Revised: 03/25/2018 Document Reviewed: 07/06/2017 Elsevier Patient Education  2020 [employer] Inc.   Sleep Apnea Sleep apnea affects breathing during sleep. It causes breathing to stop for a short time or to become shallow. It can also increase the risk of:  Heart attack.  Stroke.  Being very overweight (obese).  Diabetes.  Heart failure.  Irregular heartbeat. The goal of treatment is to help you breathe normally again. What are the causes? There are three kinds of sleep apnea:  Obstructive sleep apnea. This is caused by a blocked or collapsed airway.  Central sleep apnea. This happens when the brain does not send the right signals to the muscles that control breathing.  Mixed sleep apnea. This is a combination of obstructive and central sleep apnea. The most common cause of this condition is a collapsed or blocked airway. This can happen if:  Your throat muscles are too relaxed.  Your tongue and tonsils are too large.  You are overweight.  Your airway is too small. What increases the risk?  Being  overweight.  Smoking.  Having a small airway.  Being older.  Being male.  Drinking alcohol.  Taking medicines to calm yourself (sedatives or tranquilizers).  Having family members with the condition. What are the signs or symptoms?  Trouble staying asleep.  Being sleepy or tired during the day.  Getting angry a lot.  Loud snoring.  Headaches in the morning.  Not being able to focus your mind (concentrate).  Forgetting things.  Less interest in sex.  Mood swings.  Personality changes.  Feelings of sadness (depression).  Waking up a lot during the night to pee (urinate).  Dry mouth.  Sore  throat. How is this diagnosed?  Your medical history.  A physical exam.  A test that is done when you are sleeping (sleep study). The test is most often done in a sleep lab but may also be done at home. How is this treated?   Sleeping on your side.  Using a medicine to get rid of mucus in your nose (decongestant).  Avoiding the use of alcohol, medicines to help you relax, or certain pain medicines (narcotics).  Losing weight, if needed.  Changing your diet.  Not smoking.  Using a machine to open your airway while you sleep, such as: ? An oral appliance. This is a mouthpiece that shifts your lower jaw forward. ? A CPAP device. This device blows air through a mask when you breathe out (exhale). ? An EPAP device. This has valves that you put in each nostril. ? A BPAP device. This device blows air through a mask when you breathe in (inhale) and breathe out.  Having surgery if other treatments do not work. It is important to get treatment for sleep apnea. Without treatment, it can lead to:  High blood pressure.  Coronary artery disease.  In men, not being able to have an erection (impotence).  Reduced thinking ability. Follow these instructions at home: Lifestyle  Make changes that your doctor recommends.  Eat a healthy diet.  Lose weight if  needed.  Avoid alcohol, medicines to help you relax, and some pain medicines.  Do not use any products that contain nicotine or tobacco, such as cigarettes, e-cigarettes, and chewing tobacco. If you need help quitting, ask your doctor. General instructions  Take over-the-counter and prescription medicines only as told by your doctor.  If you were given a machine to use while you sleep, use it only as told by your doctor.  If you are having surgery, make sure to tell your doctor you have sleep apnea. You may need to bring your device with you.  Keep all follow-up visits as told by your doctor. This is important. Contact a doctor if:  The machine that you were given to use during sleep bothers you or does not seem to be working.  You do not get better.  You get worse. Get help right away if:  Your chest hurts.  You have trouble breathing in enough air.  You have an uncomfortable feeling in your back, arms, or stomach.  You have trouble talking.  One side of your body feels weak.  A part of your face is hanging down. These symptoms may be an emergency. Do not wait to see if the symptoms will go away. Get medical help right away. Call your local emergency services (911 in the [place] Do not drive yourself to the hospital. Summary  This condition affects breathing during sleep.  The most common cause is a collapsed or blocked airway.  The goal of treatment is to help you breathe normally while you sleep. This information is not intended to replace advice given to you by your health care provider. Make sure you discuss any questions you have with your health care provider. Document Released: 03/17/2008 Document Revised: 03/25/2018 Document Reviewed: 02/01/2018 Elsevier Patient Education  2020 [employer].     How the Heart Works The heart is a Programmer, systems. The heart's job is to pump blood through the entire body. This job is important because blood carries  oxygen and nutrients from the foods you eat to all the cells of your body. Blood  also carries waste products away from your cells. What does the heart look like?  The heart is made up of four chambers. The upper chambers are called the right atrium and left atrium, and the lower chambers are called the right ventricle and left ventricle. The heart has several valves that separate the upper and lower chambers from each other and that separate the lower chambers of the heart from pathways that lead away from the heart. The valves include:  The tricuspid valve. This valve separates the right atrium from the right ventricle.  The mitral valve. This valve separates the left atrium from the left ventricle.  The pulmonary valve. This valve separates the right ventricle from a pathway that leads to the lungs.  The aortic valve. This valve separates the left ventricle from a pathway that leads to the rest of the body. How does blood travel through the heart? Blood that has traveled through the body enters the heart at the right atrium. Then the blood travels in this sequence: 1. Blood is pumped from the right atrium into the right ventricle. 2. Blood is pumped out of the heart into the lungs, where it picks up oxygen. 3. Blood re-enters the heart at the left atrium. 4. Blood is pumped into the left ventricle. 5. Blood is pumped out of the heart to the rest of the body. How does the heart beat? The heart beats when all the chambers of the heart squeeze (contract). The process starts when blood collects in the upper chambers of the heart. Once the chambers are full, a group of cells called the sinoatrial node sends out an electrical signal that makes the upper chambers contract. When the chambers contract, they push the blood through the tricuspid and mitral valves into the lower chambers of the heart. Once the lower chambers have filled with blood, an electrical signal causes these chambers to contract.  This pushes blood through the pulmonary and aortic valves and out of the heart. How does activity affect the heart? The heart beats faster and works harder when you are active. It beats more slowly and works less hard when you are resting. Your brain sends signals to the heart to meet the needs of your body. This information is not intended to replace advice given to you by your health care provider. Make sure you discuss any questions you have with your health care provider. Document Released: 05/13/2004 Document Revised: 10/03/2018 Document Reviewed: 05/13/2016 Elsevier Patient Education  2020 Elsevier Inc.    Heart Failure Eating Plan Heart failure, also called congestive heart failure, occurs when your heart does not pump blood well enough to meet your body's needs for oxygen-rich blood. Heart failure is a long-term (chronic) condition. Living with heart failure can be challenging. However, following your health care provider's instructions about a healthy lifestyle and working with a diet and nutrition specialist (dietitian) to choose the right foods may help to improve your symptoms. What are tips for following this plan? Reading food labels  Check food labels for the amount of sodium per serving. Choose foods that have less than 140 mg (milligrams) of sodium in each serving.  Check food labels for the number of calories per serving. This is important if you need to limit your daily calorie intake to lose weight.  Check food labels for the serving size. If you eat more than one serving, you will be eating more sodium and calories than what is listed on the label.  Look for  foods that are labeled as "sodium-free," "very low sodium," or "low sodium." ? Foods labeled as "reduced sodium" or "lightly salted" may still have more sodium than what is recommended for you. Cooking  Avoid adding salt when cooking. Ask your health care provider or dietitian before using salt substitutes.  Season  food with salt-free seasonings, spices, or herbs. Check the label of seasoning mixes to make sure they do not contain salt.  Cook with heart-healthy oils, such as olive, canola, soybean, or sunflower oil.  Do not fry foods. Cook foods using low-fat methods, such as baking, boiling, grilling, and broiling.  Limit unhealthy fats when cooking by: ? Removing the skin from poultry, such as chicken. ? Removing all visible fats from meats. ? Skimming the fat off from stews, soups, and gravies before serving them. Meal planning   Limit your intake of: ? Processed, canned, or pre-packaged foods. ? Foods that are high in trans fat, such as fried foods. ? Sweets, desserts, sugary drinks, and other foods with added sugar. ? Full-fat dairy products, such as whole milk.  Eat a balanced diet that includes: ? 4-5 servings of fruit each day and 4-5 servings of vegetables each day. At each meal, try to fill half of your plate with fruits and vegetables. ? Up to 6-8 servings of whole grains each day. ? Up to 2 servings of lean meat, poultry, or fish each day. One serving of meat is equal to 3 oz. This is about the same size as a deck of cards. ? 2 servings of low-fat dairy each day. ? Heart-healthy fats. Healthy fats called omega-3 fatty acids are found in foods such as flaxseed and cold-water fish like sardines, salmon, and mackerel.  Aim to eat 25-35 g (grams) of fiber a day. Foods that are high in fiber include apples, broccoli, carrots, beans, peas, and whole grains.  Do not add salt or condiments that contain salt (such as soy sauce) to foods before eating.  When eating at a restaurant, ask that your food be prepared with less salt or no salt, if possible.  Try to eat 2 or more vegetarian meals each week.  Eat more home-cooked food and eat less restaurant, buffet, and fast food. General information  Do not eat more than 2,300 mg of salt (sodium) a day. The amount of sodium that is recommended  for you may be lower, depending on your condition.  Maintain a healthy body weight as directed. Ask your health care provider what a healthy weight is for you. ? Check your weight every day. ? Work with your health care provider and dietitian to make a plan that is right for you to lose weight or maintain your current weight.  Limit how much fluid you drink. Ask your health care provider or dietitian how much fluid you can have each day.  Limit or avoid alcohol as told by your health care provider or dietitian. Recommended foods The items listed may not be a complete list. Talk with your dietitian about what dietary choices are best for you. Fruits All fresh, frozen, and canned fruits. Dried fruits, such as raisins, prunes, and cranberries. Vegetables All fresh vegetables. Vegetables that are frozen without sauce or added salt. Low-sodium or sodium-free canned vegetables. Grains Bread with less than 80 mg of sodium per slice. Whole-wheat pasta, quinoa, and brown rice. Oats and oatmeal. Barley. Millet. Grits and cream of wheat. Whole-grain and whole-wheat cold cereal. Meats and other protein foods Lean cuts of meat.  Skinless chicken and [place]. Fish with high omega-3 fatty acids, such as salmon, sardines, and other cold-water fishes. Eggs. Dried beans, peas, and edamame. Unsalted nuts and nut butters. Dairy Low-fat or nonfat (skim) milk and dried milk. Rice milk, soy milk, and almond milk. Low-fat or nonfat yogurt. Small amounts of reduced-sodium block cheese. Low-sodium cottage cheese. Fats and oils Olive, canola, soybean, flaxseed, or sunflower oil. Avocado. Sweets and desserts Apple sauce. Granola bars. Sugar-free pudding and gelatin. Frozen fruit bars. Seasoning and other foods Fresh and dried herbs. Lemon or lime juice. Vinegar. Low-sodium ketchup. Salt-free marinades, salad dressings, sauces, and seasonings. The items listed above may not be a complete list of foods and beverages you  can eat. Contact a dietitian for more information. Foods to avoid The items listed may not be a complete list. Talk with your dietitian about what dietary choices are best for you. Fruits Fruits that are dried with sodium-containing preservatives. Vegetables Canned vegetables. Frozen vegetables with sauce or seasonings. Creamed vegetables. [place] fries. Onion rings. Pickled vegetables and sauerkraut. Grains Bread with more than 80 mg of sodium per slice. Hot or cold cereal with more than 140 mg sodium per serving. Salted pretzels and crackers. Pre-packaged breadcrumbs. Bagels, croissants, and biscuits. Meats and other protein foods Ribs and chicken wings. Bacon, ham, pepperoni, bologna, salami, and packaged luncheon meats. Hot dogs, bratwurst, and sausage. Canned meat. Smoked meat and fish. Salted nuts and seeds. Dairy Whole milk, half-and-half, and cream. Buttermilk. Processed cheese, cheese spreads, and cheese curds. Regular cottage cheese. Feta cheese. Shredded cheese. String cheese. Fats and oils Butter, lard, shortening, ghee, and bacon fat. Canned and packaged gravies. Seasoning and other foods Onion salt, garlic salt, table salt, and sea salt. Marinades. Regular salad dressings. Relishes, pickles, and olives. Meat flavorings and tenderizers, and bouillon cubes. Horseradish, ketchup, and mustard. Worcestershire sauce. Teriyaki sauce, soy sauce (including reduced sodium). Hot sauce and Tabasco sauce. Steak sauce, fish sauce, oyster sauce, and cocktail sauce. Taco seasonings. Barbecue sauce. Tartar sauce. The items listed above may not be a complete list of foods and beverages you should avoid. Contact a dietitian for more information. Summary  A heart failure eating plan includes changes that limit your intake of sodium and unhealthy fat, and it may help you lose weight or maintain a healthy weight. Your health care provider may also recommend limiting how much fluid you drink.  Most  people with heart failure should eat no more than 2,300 mg of salt (sodium) a day. The amount of sodium that is recommended for you may be lower, depending on your condition.  Contact your health care provider or dietitian before making any major changes to your diet. This information is not intended to replace advice given to you by your health care provider. Make sure you discuss any questions you have with your health care provider. Document Released: 10/23/2016 Document Revised: 08/04/2018 Document Reviewed: 10/23/2016 Elsevier Patient Education  2020 Elsevier Inc.    Heart Failure Action Plan A heart failure action plan helps you understand what to do when you have symptoms of heart failure. Follow the plan that was created by you and your health care provider. Review your plan each time you visit your health care provider. Red zone These signs and symptoms mean you should get medical help right away:  You have trouble breathing when resting.  You have a dry cough that is getting worse.  You have swelling or pain in your legs or abdomen that is getting  worse.  You suddenly gain more than 2-3 lb (0.9-1.4 kg) in a day, or more than 5 lb (2.3 kg) in one week. This amount may be more or less depending on your condition.  You have trouble staying awake or you feel confused.  You have chest pain.  You do not have an appetite.  You pass out. If you experience any of these symptoms:  Call your local emergency services (911 in the [place] right away or seek help at the emergency department of the nearest hospital. Yellow zone These signs and symptoms mean your condition may be getting worse and you should make some changes:  You have trouble breathing when you are active or you need to sleep with extra pillows.  You have swelling in your legs or abdomen.  You gain 2-3 lb (0.9-1.4 kg) in one day, or 5 lb (2.3 kg) in one week. This amount may be more or less depending on your  condition.  You get tired easily.  You have trouble sleeping.  You have a dry cough. If you experience any of these symptoms:  Contact your health care provider within the next day.  Your health care provider may adjust your medicines. Green zone These signs mean you are doing well and can continue what you are doing:  You do not have shortness of breath.  You have very little swelling or no new swelling.  Your weight is stable (no gain or loss).  You have a normal activity level.  You do not have chest pain or any other new symptoms. Follow these instructions at home:  Take over-the-counter and prescription medicines only as told by your health care provider.  Weigh yourself daily. Your target weight is __________ lb (__________ kg). ? Call your health care provider if you gain more than __________ lb (__________ kg) in a day, or more than __________ lb (__________ kg) in one week.  Eat a heart-healthy diet. Work with a diet and nutrition specialist (dietitian) to create an eating plan that is best for you.  Keep all follow-up visits as told by your health care provider. This is important. Where to find more information  American Heart Association: www.heart.org Summary  Follow the action plan that was created by you and your health care provider.  Get help right away if you have any symptoms in the Red zone. This information is not intended to replace advice given to you by your health care provider. Make sure you discuss any questions you have with your health care provider. Document Released: 07/18/2016 Document Revised: 05/21/2017 Document Reviewed: 07/18/2016 Elsevier Patient Education  2020 Elsevier Inc.   IMPORTANT INFORMATION: PAY CLOSE ATTENTION   PHYSICIAN DISCHARGE INSTRUCTIONS  Follow with Primary care provider  Pearson Grippe, MD  and other consultants as instructed by your Hospitalist Physician  SEEK MEDICAL CARE OR RETURN TO EMERGENCY ROOM IF SYMPTOMS  COME BACK, WORSEN OR NEW PROBLEM DEVELOPS   Please note: You were cared for by a hospitalist during your hospital stay. Every effort will be made to forward records to your primary care provider.  You can request that your primary care provider send for your hospital records if they have not received them.  Once you are discharged, your primary care physician will handle any further medical issues. Please note that NO REFILLS for any discharge medications will be authorized once you are discharged, as it is imperative that you return to your primary care physician (or establish a relationship with a  primary care physician if you do not have one) for your post hospital discharge needs so that they can reassess your need for medications and monitor your lab values.  Please get a complete blood count and chemistry panel checked by your Primary MD at your next visit, and again as instructed by your Primary MD.  Get Medicines reviewed and adjusted: Please take all your medications with you for your next visit with your Primary MD  Laboratory/radiological data: Please request your Primary MD to go over all hospital tests and procedure/radiological results at the follow up, please ask your primary care provider to get all Hospital records sent to his/her office.  In some cases, they will be blood work, cultures and biopsy results pending at the time of your discharge. Please request that your primary care provider follow up on these results.  If you are diabetic, please bring your blood sugar readings with you to your follow up appointment with primary care.    Please call and make your follow up appointments as soon as possible.    Also Note the following: If you experience worsening of your admission symptoms, develop shortness of breath, life threatening emergency, suicidal or homicidal thoughts you must seek medical attention immediately by calling 911 or calling your MD immediately  if symptoms  less severe.  You must read complete instructions/literature along with all the possible adverse reactions/side effects for all the Medicines you take and that have been prescribed to you. Take any new Medicines after you have completely understood and accpet all the possible adverse reactions/side effects.   Do not drive when taking Pain medications or sleeping medications (Benzodiazepines)  Do not take more than prescribed Pain, Sleep and Anxiety Medications. It is not advisable to combine anxiety,sleep and pain medications without talking with your primary care practitioner  Special Instructions: If you have smoked or chewed Tobacco  in the last 2 yrs please stop smoking, stop any regular Alcohol  and or any Recreational drug use.  Wear Seat belts while driving.  Do not drive if taking any narcotic, mind altering or controlled substances or recreational drugs or alcohol.

## 2019-02-10 NOTE — TOC Transition Note (Signed)
Transition of Care [facility]) - CM/SW Discharge Note   Patient Details  Name: [name] MRN: [id number] Date of Birth: [birth year]  Transition of Care [facility]) CM/SW Contact:  Shanzay Hepworth, Chauncey Reading, RN Phone Number: 02/10/2019, 11:15 AM   Clinical Narrative:   Patient discharging today. CM has called PCP to schedule appt., PCP to call back. Patient to plans to follow up as OP for sleep study to obtain a CPAP.  He remains active with [facility].     Final next level of care: Home/Self Care Barriers to Discharge: No Barriers Identified   Patient Goals and CMS Choice Patient states their goals for this hospitalization and ongoing recovery are:: plans to return home and will come back to ED when SOB                                        Social Determinants of Health (SDOH) Interventions     Readmission Risk Interventions Readmission Risk Prevention Plan 02/10/2019 02/08/2019 12/16/2018  Transportation Screening - Complete Complete  Medication Review Press photographer) - Complete Complete  PCP or Specialist appointment within 3-5 days of discharge Complete - -  HRI or [place] - - Complete  HRI or Home Care Consult Pt Refusal Comments - - -  SW Recovery Care/Counseling Consult - - Complete  Palliative Care Screening - Not Applicable Not [place] - Not Applicable Not Applicable  Some recent data might be hidden

## 2019-02-10 NOTE — Plan of Care (Signed)

## 2019-02-14 ENCOUNTER — Other Ambulatory Visit: Payer: Self-pay

## 2019-02-14 NOTE — Patient Outreach (Signed)
[facility]) Care Management  02/14/2019  [name] 12-31-1965 [id number]    [name] was readmitted on 02/07/19 for Acute on chronic diastolic CHF. He was discharged on 02/10/19. Attempted post discharge outreach. Outreach unsuccessful. Phone rang multiple times without option to leave a voice message. Will attempt outreach later this week.  Per discharge summary, the following appointments were required: Neurology: He is to establish care with Dr. Merlene Laughter to arrange outpatient sleep study for home CPAP. Call placed to [facility] Neurology. Referral Manager Raquel Sarna confirmed receipt of the request. However, she indicated that a referral was needed from the primary care provider prior to scheduling the visit. Referral pending. Primary Care. Call placed to [facility]. Staff confirmed pending follow-up appointment on 02/15/19. Staff agreed to submit the referral to [facility] Neurology after the clinic visit. Cardiology. [name] is scheduled for outreach with Dr. Bronson Ing on 02/16/19.  PLAN -Will continue weekly outreach attempts.  [facility] [phone]

## 2019-02-15 DIAGNOSIS — I482 Chronic atrial fibrillation, unspecified: Secondary | ICD-10-CM | POA: Diagnosis not present

## 2019-02-15 DIAGNOSIS — I5042 Chronic combined systolic (congestive) and diastolic (congestive) heart failure: Secondary | ICD-10-CM | POA: Diagnosis not present

## 2019-02-15 DIAGNOSIS — I890 Lymphedema, not elsewhere classified: Secondary | ICD-10-CM | POA: Diagnosis not present

## 2019-02-15 DIAGNOSIS — R600 Localized edema: Secondary | ICD-10-CM | POA: Diagnosis not present

## 2019-02-15 DIAGNOSIS — I10 Essential (primary) hypertension: Secondary | ICD-10-CM | POA: Diagnosis not present

## 2019-02-16 ENCOUNTER — Telehealth (INDEPENDENT_AMBULATORY_CARE_PROVIDER_SITE_OTHER): Payer: Medicare Other | Admitting: Cardiovascular Disease

## 2019-02-16 ENCOUNTER — Encounter: Payer: Self-pay | Admitting: Cardiovascular Disease

## 2019-02-16 VITALS — BP 165/110 | HR 92 | Ht 72.0 in | Wt 372.0 lb

## 2019-02-16 DIAGNOSIS — I5033 Acute on chronic diastolic (congestive) heart failure: Secondary | ICD-10-CM | POA: Diagnosis not present

## 2019-02-16 DIAGNOSIS — I10 Essential (primary) hypertension: Secondary | ICD-10-CM

## 2019-02-16 DIAGNOSIS — G4733 Obstructive sleep apnea (adult) (pediatric): Secondary | ICD-10-CM

## 2019-02-16 DIAGNOSIS — I4821 Permanent atrial fibrillation: Secondary | ICD-10-CM

## 2019-02-16 DIAGNOSIS — I428 Other cardiomyopathies: Secondary | ICD-10-CM

## 2019-02-16 NOTE — Patient Instructions (Signed)
Medication Instructions:  Continue all current medications.  Labwork: none  Testing/Procedures: none  Follow-Up: 6 weeks   Any Other Special Instructions Will Be Listed Below (If Applicable).  If you need a refill on your cardiac medications before your next appointment, please call your pharmacy.  

## 2019-02-16 NOTE — Progress Notes (Signed)
Virtual Visit via Telephone Note   This visit type was conducted due to national recommendations for restrictions regarding the COVID-19 Pandemic (e.g. social distancing) in an effort to limit this patient's exposure and mitigate transmission in our community.  Due to his co-morbid illnesses, this patient is at least at moderate risk for complications without adequate follow up.  This format is felt to be most appropriate for this patient at this time.  The patient did not have access to video technology/had technical difficulties with video requiring transitioning to audio format only (telephone).  All issues noted in this document were discussed and addressed.  No physical exam could be performed with this format.  Please refer to the patient's chart for his  consent to telehealth [facility].   Date:  02/16/2019   ID:  [name], DOB 01/31/1966, MRN [id number]  Patient Location: Home Provider Location: Office  PCP:  Pearson GrippeKim, [name], MD  Cardiologist:  Prentice DockerSuresh Zafirah Vanzee, MD  Electrophysiologist:  None   Evaluation Performed:  Follow-Up Visit  Chief Complaint:  Diastolic HF  History of Present Illness:    [name] is a 53 y.o. male with recurrent diastolic heart failure due to medication and dietary noncompliance.    He was again recently hospitalized for decompensated diastolic heart failure and discharged on 02/10/2019.  He has been referred to neurology for an outpatient sleep study.  Discharge weight was 159.1 kg.  He was discharged on Lasix 120 mg twice daily.  [facility], Juanell FairlyFelecia McCray, RN.  His weight is up 13 pounds since discharge.   He said his "legs are swollen a little bit". He was going to try and take some additional Lasix.  He's been eating canned foods but tries to "watch the calories". He eats a lots of fish, sometimes fried and sometimes boiled.  He denies palpitations.   Past Medical History:   Diagnosis Date  . Allergic rhinitis   . Asthma   . Atrial fibrillation (HCC)   . CHF (congestive heart failure) (HCC)   . Elevated troponin level not due myocardial infarction 06/13/2015   cath w/ nl cors, felt 2nd HTN, CHF, peat trop 2.73  . Essential hypertension   . Gastroesophageal reflux disease   . History of cardiac catheterization    Normal coronaries December 2016  . Noncompliance    Major problem leading to declining health and recurrent hospitalization  . Nonischemic cardiomyopathy (HCC)    a. LVEF 20-25% in 2017 with cath in 2016 showing normal cors. b. EF 55% by echo in 03/2018.  . On home O2    3L N/C  . OSA (obstructive sleep apnea)   . Osteoarthritis   . Peptic ulcer disease    Past Surgical History:  Procedure Laterality Date  . CARDIAC CATHETERIZATION N/A 06/14/2015   Procedure: Right/Left Heart Cath and Coronary Angiography;  Surgeon: Runell GessJonathan J Berry, MD;  [facility];  Service: Cardiovascular;  Laterality: N/A;     Current Meds  Medication Sig  . acetaminophen (TYLENOL) 325 MG tablet Take 2 tablets (650 mg total) by mouth every 4 (four) hours as needed for headache or mild pain.  Marland Kitchen. albuterol (PROVENTIL) (2.5 MG/3ML) 0.083% nebulizer solution Take 2.5 mg by nebulization every 6 (six) hours as needed for wheezing or shortness of breath.  Marland Kitchen. atorvastatin (LIPITOR) 40 MG tablet Take 40 mg by mouth daily.   . carvedilol (COREG) 25 MG tablet  Take 1 tablet (25 mg total) by mouth 2 (two) times daily with a meal for 30 days.  Marland Kitchen docusate sodium (COLACE) 100 MG capsule Take 1 capsule (100 mg total) by mouth every morning.  . ferrous gluconate (FERGON) 324 MG tablet Take 1 tablet (324 mg total) by mouth daily.  . fluticasone (FLOVENT HFA) 220 MCG/ACT inhaler Inhale 2 puffs into the lungs every evening.   . furosemide (LASIX) 80 MG tablet Take 1.5 tablets (120 mg total) by mouth 2 (two) times daily.  Marland Kitchen lisinopril (ZESTRIL) 20 MG tablet Take 20 mg by mouth  daily.  . pantoprazole (PROTONIX) 40 MG tablet Take 1 tablet (40 mg total) by mouth daily at 6 (six) AM.  . potassium chloride SA (K-DUR) 20 MEQ tablet Take 1 tablet (20 mEq total) by mouth 2 (two) times daily.  . rivaroxaban (XARELTO) 20 MG TABS tablet Take 1 tablet (20 mg total) by mouth daily with supper.  Marland Kitchen spironolactone (ALDACTONE) 100 MG tablet Take 0.5 tablets (50 mg total) by mouth daily. For fluid  . tamsulosin (FLOMAX) 0.4 MG CAPS capsule Take 1 capsule (0.4 mg total) by mouth daily.     Allergies:   Banana, Bee venom, Strawberry flavor, Aspirin, Metolazone, Oatmeal, Orange juice [orange oil], Torsemide, Diltiazem, Hydralazine, and Lipitor [atorvastatin]   Social History   Tobacco Use  . Smoking status: Former Smoker    Packs/day: 0.50    Years: 20.00    Pack years: 10.00    Types: Cigarettes    Start date: 04/26/1988    Quit date: 06/23/2007    Years since quitting: 11.6  . Smokeless tobacco: Never Used  . Tobacco comment: 1 ppd former smoker  Substance Use Topics  . Alcohol use: Not Currently    Alcohol/week: 0.0 standard drinks    Comment: No etoh since 2009  . Drug use: No     Family Hx: The patient's family history includes Aneurysm in his mother; Heart attack in his father; Hypertension in his sister; Stroke in his father. There is no history of Colon cancer, Inflammatory bowel disease, or Liver disease.  ROS:   Please see the history of present illness.     All other systems reviewed and are negative.   Prior CV studies:   The following studies were reviewed today:  Echocardiogram 12/08/2018:  1. The left ventricle has low normal systolic function, with an ejection fraction of 50-55%. The cavity size was normal. There is moderately increased left ventricular wall thickness. Left ventricular diastolic Doppler parameters are indeterminate.  2. Inferior basal hypokinesis.  3. The right ventricle has normal systolic function. The cavity was normal. There is no  increase in right ventricular wall thickness.  4. Left atrial size was severely dilated.  5. Right atrial size was moderately dilated.  6. Mild thickening of the mitral valve leaflet. Mild calcification of the mitral valve leaflet. There is mild mitral annular calcification present.  7. The tricuspid valve is grossly normal.  8. The aortic valve is tricuspid. Moderate thickening of the aortic valve. Sclerosis without any evidence of stenosis of the aortic valve. Aortic valve regurgitation was not assessed by color flow Doppler.  Labs/Other Tests and Data Reviewed:    EKG:  No ECG reviewed.  Recent Labs: 02/07/2019: ALT 15; B Natriuretic Peptide 288.0; Hemoglobin 12.2; Magnesium 2.1; Platelets 224 02/08/2019: BUN 12; Creatinine, Ser 0.98; Potassium 3.6; Sodium 141   Recent Lipid Panel Lab Results  Component Value Date/Time   CHOL 139 06/14/2015  03:28 AM   TRIG 58 06/14/2015 03:28 AM   HDL 28 (L) 06/14/2015 03:28 AM   CHOLHDL 5.0 06/14/2015 03:28 AM   LDLCALC 99 06/14/2015 03:28 AM    Wt Readings from Last 3 Encounters:  02/16/19 (!) 372 lb (168.7 kg)  02/10/19 (!) 350 lb 12 oz (159.1 kg)  01/11/19 (!) 353 lb 9.9 oz (160.4 kg)     Objective:    Vital Signs:  BP (!) 165/110   Pulse 92   Ht 6' (1.829 m)   Wt (!) 372 lb (168.7 kg)   BMI 50.45 kg/m    VITAL SIGNS:  reviewed  ASSESSMENT & PLAN:    1.  Acute on chronic diastolic heart failure: His weight is up 13 pounds since discharge.  He has been prescribed Lasix 120 mg twice daily as he did not want to take torsemide.  Blood pressure is elevated. He plans to take some extra Lasix (80 mg) in addition to what he normally takes and I told him it would be ok to do that for the next several days. We have previously talked considerably about the importance of a low-sodium diet and medication compliance.  2.  Nonischemic cardiomyopathy: Left ventricular systolic function is normal by most recent echocardiogram in June 2020, LVEF 50  to 55%.  Normal coronaries in December 2016.  3.  Permanent atrial fibrillation: Heart rate is controlled on carvedilol.  Anticoagulated with Xarelto.    4.  Hypertension: Blood pressure is markedly elevated.  He needs diuresis. He said he was busy right before it was checked and says if he rests 30 minutes in improves considerably.   5. OSA: Needs home CPAP. Scheduled for sleep study evaluation with neurology in [place].    COVID-19 Education: The signs and symptoms of COVID-19 were discussed with the patient and how to seek care for testing (follow up with PCP or arrange E-visit).  The importance of social distancing was discussed today.  Time:   Today, I have spent 10 minutes with the patient with telehealth technology discussing the above problems.     Medication Adjustments/Labs and Tests Ordered: Current medicines are reviewed at length with the patient today.  Concerns regarding medicines are outlined above.   Tests Ordered: No orders of the defined types were placed in this encounter.   Medication Changes: No orders of the defined types were placed in this encounter.   Follow Up:  Virtual Visit in 6 week(s)  Signed, Prentice Docker, MD  02/16/2019 1:17 PM    [facility]

## 2019-02-17 ENCOUNTER — Emergency Department (HOSPITAL_COMMUNITY): Payer: Medicare Other

## 2019-02-17 ENCOUNTER — Encounter (HOSPITAL_COMMUNITY): Payer: Self-pay

## 2019-02-17 ENCOUNTER — Other Ambulatory Visit: Payer: Self-pay

## 2019-02-17 ENCOUNTER — Emergency Department (HOSPITAL_COMMUNITY)
Admission: EM | Admit: 2019-02-17 | Discharge: 2019-02-17 | Disposition: A | Discharge status: Home / Self Care | Payer: Medicare Other | Attending: Emergency Medicine | Admitting: Emergency Medicine

## 2019-02-17 DIAGNOSIS — M7989 Other specified soft tissue disorders: Secondary | ICD-10-CM | POA: Diagnosis not present

## 2019-02-17 DIAGNOSIS — M79604 Pain in right leg: Secondary | ICD-10-CM

## 2019-02-17 DIAGNOSIS — R0682 Tachypnea, not elsewhere classified: Secondary | ICD-10-CM | POA: Diagnosis not present

## 2019-02-17 DIAGNOSIS — Z87891 Personal history of nicotine dependence: Secondary | ICD-10-CM | POA: Diagnosis not present

## 2019-02-17 DIAGNOSIS — I5043 Acute on chronic combined systolic (congestive) and diastolic (congestive) heart failure: Secondary | ICD-10-CM | POA: Insufficient documentation

## 2019-02-17 DIAGNOSIS — R072 Precordial pain: Secondary | ICD-10-CM | POA: Diagnosis not present

## 2019-02-17 DIAGNOSIS — I110 Hypertensive heart disease with heart failure: Secondary | ICD-10-CM | POA: Insufficient documentation

## 2019-02-17 DIAGNOSIS — Z20828 Contact with and (suspected) exposure to other viral communicable diseases: Secondary | ICD-10-CM | POA: Insufficient documentation

## 2019-02-17 DIAGNOSIS — I10 Essential (primary) hypertension: Secondary | ICD-10-CM | POA: Diagnosis not present

## 2019-02-17 DIAGNOSIS — I499 Cardiac arrhythmia, unspecified: Secondary | ICD-10-CM | POA: Diagnosis not present

## 2019-02-17 DIAGNOSIS — J45909 Unspecified asthma, uncomplicated: Secondary | ICD-10-CM | POA: Insufficient documentation

## 2019-02-17 DIAGNOSIS — I4891 Unspecified atrial fibrillation: Secondary | ICD-10-CM | POA: Insufficient documentation

## 2019-02-17 DIAGNOSIS — Z7901 Long term (current) use of anticoagulants: Secondary | ICD-10-CM | POA: Insufficient documentation

## 2019-02-17 DIAGNOSIS — R600 Localized edema: Secondary | ICD-10-CM | POA: Insufficient documentation

## 2019-02-17 DIAGNOSIS — R079 Chest pain, unspecified: Secondary | ICD-10-CM | POA: Diagnosis not present

## 2019-02-17 LAB — CBC WITH DIFFERENTIAL/PLATELET
Abs Immature Granulocytes: 0.01 10*3/uL (ref 0.00–0.07)
Basophils Absolute: 0 10*3/uL (ref 0.0–0.1)
Basophils Relative: 0 %
Eosinophils Absolute: 0.1 10*3/uL (ref 0.0–0.5)
Eosinophils Relative: 2 %
HCT: 41.3 % (ref 39.0–52.0)
Hemoglobin: 12.9 g/dL — ABNORMAL LOW (ref 13.0–17.0)
Immature Granulocytes: 0 %
Lymphocytes Relative: 20 %
Lymphs Abs: 1 10*3/uL (ref 0.7–4.0)
MCH: 27.9 pg (ref 26.0–34.0)
MCHC: 31.2 g/dL (ref 30.0–36.0)
MCV: 89.4 fL (ref 80.0–100.0)
Monocytes Absolute: 0.4 10*3/uL (ref 0.1–1.0)
Monocytes Relative: 9 %
Neutro Abs: 3.3 10*3/uL (ref 1.7–7.7)
Neutrophils Relative %: 69 %
Platelets: 260 10*3/uL (ref 150–400)
RBC: 4.62 MIL/uL (ref 4.22–5.81)
RDW: 14.4 % (ref 11.5–15.5)
WBC: 4.8 10*3/uL (ref 4.0–10.5)
nRBC: 0 % (ref 0.0–0.2)

## 2019-02-17 LAB — COMPREHENSIVE METABOLIC PANEL
ALT: 16 U/L (ref 0–44)
AST: 20 U/L (ref 15–41)
Albumin: 3.8 g/dL (ref 3.5–5.0)
Alkaline Phosphatase: 54 U/L (ref 38–126)
Anion gap: 9 (ref 5–15)
BUN: 12 mg/dL (ref 6–20)
CO2: 26 mmol/L (ref 22–32)
Calcium: 8.5 mg/dL — ABNORMAL LOW (ref 8.9–10.3)
Chloride: 105 mmol/L (ref 98–111)
Creatinine, Ser: 1.05 mg/dL (ref 0.61–1.24)
GFR calc Af Amer: >60 mL/min (ref >60)
GFR calc non Af Amer: >60 mL/min (ref >60)
Glucose, Bld: 115 mg/dL — ABNORMAL HIGH (ref 70–99)
Potassium: 3.3 mmol/L — ABNORMAL LOW (ref 3.5–5.1)
Sodium: 140 mmol/L (ref 135–145)
Total Bilirubin: 1.3 mg/dL — ABNORMAL HIGH (ref 0.3–1.2)
Total Protein: 7.3 g/dL (ref 6.5–8.1)

## 2019-02-17 LAB — BRAIN NATRIURETIC PEPTIDE: B Natriuretic Peptide: 382 pg/mL — ABNORMAL HIGH (ref 0.0–100.0)

## 2019-02-17 LAB — SARS CORONAVIRUS 2 BY RT PCR (HOSPITAL ORDER, PERFORMED IN ~~LOC~~ HOSPITAL LAB): SARS Coronavirus 2: NEGATIVE

## 2019-02-17 LAB — TROPONIN I (HIGH SENSITIVITY)
Troponin I (High Sensitivity): 26 ng/L — ABNORMAL HIGH (ref <18)
Troponin I (High Sensitivity): 29 ng/L — ABNORMAL HIGH (ref <18)

## 2019-02-17 LAB — LIPASE, BLOOD: Lipase: 22 U/L (ref 11–51)

## 2019-02-17 MED ORDER — LIDOCAINE 5 % EX OINT: 1.0000 "application " | TOPICAL_OINTMENT | Freq: Three times a day (TID) | CUTANEOUS | 0 refills | Status: AC | PRN

## 2019-02-17 MED ORDER — MORPHINE SULFATE (PF) 4 MG/ML IV SOLN
4.0000 mg | Freq: Once | INTRAVENOUS | Status: AC
  Administered 2019-02-17: 08:00:00 4 mg via INTRAVENOUS
  Filled 2019-02-17: qty 1

## 2019-02-17 MED ORDER — IOHEXOL 350 MG/ML SOLN
75.0000 mL | Freq: Once | INTRAVENOUS | Status: AC | PRN
  Administered 2019-02-17: 10:00:00 75 mL via INTRAVENOUS

## 2019-02-17 NOTE — Patient Outreach (Signed)
[facility]) Care Management  02/17/2019  [name] December 14, 1965 [id number]    Attempted post discharge outreach with [name]. Per chart review, he returned to the Emergency Department today due to leg pain.   Will monitor discharge disposition. Will attempt outreach next week if he is not readmitted.  [place] Care Management [phone]

## 2019-02-17 NOTE — Discharge Instructions (Signed)
You were seen in the emergency department today with right leg pain with swelling and chest discomfort.  Your work-up today showed no acute findings.  You do have some dilated veins in the leg which could be causing some discomfort.  Please take the lidocaine ointment and apply it to your leg as needed for pain.  Call both your primary care doctor and your cardiologist regarding your symptoms to schedule a close follow-up appointment.  Return to the emergency department immediately if you develop sudden worsening chest pain, shortness of breath, or other severe symptoms.

## 2019-02-17 NOTE — ED Provider Notes (Signed)
Emergency Department Provider Note   I have reviewed the triage vital signs and the nursing notes.   HISTORY  Chief Complaint Leg Pain   HPI [name] is a 53 y.o. male with past medical history of congestive heart failure, nonischemic cardiomyopathy, A. fib on Xarelto, presents to the emergency department for evaluation of right leg pain/swelling with associated chest pain and dyspnea.  Patient states that he has had 1 to 2 weeks of primarily right leg swelling and pain.  He has been taking his Lasix including some extra Lasix after his virtual appointment with his cardiologist yesterday.  His cardiology note states that he is up 13 pounds since discharge from CHF admit on 8/21.  Patient states that 2 days ago he developed a central chest pressure/tightness.  Pain is mostly constant and sharp at times.  He reports associated shortness of breath which is worse with standing or moving around.  Improved with rest.  No fevers or chills.  Patient states that he has been compliant with his Xarelto but does miss doses "occasionally."   Past Medical History:  Diagnosis Date   Allergic rhinitis    Asthma    Atrial fibrillation (HCC)    CHF (congestive heart failure) (HCC)    Elevated troponin level not due myocardial infarction 06/13/2015   cath w/ nl cors, felt 2nd HTN, CHF, peat trop 2.73   Essential hypertension    Gastroesophageal reflux disease    History of cardiac catheterization    Normal coronaries December 2016   Noncompliance    Major problem leading to declining health and recurrent hospitalization   Nonischemic cardiomyopathy (HCC)    a. LVEF 20-25% in 2017 with cath in 2016 showing normal cors. b. EF 55% by echo in 03/2018.   On home O2    3L N/C   OSA (obstructive sleep apnea)    Osteoarthritis    Peptic ulcer disease     Patient Active Problem List   Diagnosis Date Noted   Fluid overload 02/07/2019   Cellulitis of right leg    Acute diastolic  CHF (congestive heart failure) (HCC) 12/15/2018   Cellulitis 12/08/2018   Chronic obstructive pulmonary disease (HCC)    Benign prostatic hyperplasia without lower urinary tract symptoms    Acute on chronic combined systolic and diastolic CHF (congestive heart failure) (HCC) 10/02/2018   AKI (acute kidney injury) (HCC) 09/18/2018   Acute on chronic combined systolic and diastolic congestive heart failure (HCC) 08/15/2018   SOB (shortness of breath) 08/03/2018   Volume overload 07/22/2018   Acute systolic CHF (congestive heart failure) (HCC) 07/04/2018   Acute on chronic combined systolic and diastolic heart failure (HCC) 04/01/2018   Hypomagnesemia 04/01/2018   Acute on chronic diastolic CHF (congestive heart failure) (HCC) 03/11/2018   Pressure injury of skin 03/11/2018   CHF (congestive heart failure) (HCC) 09/08/2017   Altered mental status 05/11/2017   Acute exacerbation of CHF (congestive heart failure) (HCC) 04/20/2017   Acute on chronic systolic (congestive) heart failure (HCC) 04/19/2017   CHF exacerbation (HCC) 04/11/2017   Chronic respiratory failure with hypoxia (HCC) 04/11/2017   Encounter for hospice care discussion    Palliative care encounter    Goals of care, counseling/discussion    DNR (do not resuscitate)    DNR (do not resuscitate) discussion    Chronic congestive heart failure (HCC) 02/18/2017   Acute systolic heart failure (HCC) 01/18/2017   Pedal edema 12/02/2016   Acute CHF (congestive heart failure) (  HCC) 11/09/2016   Acute on chronic systolic CHF (congestive heart failure) (HCC) 08/19/2016   Chronic renal failure, stage 2 (mild) 05/17/2016   Coronary arteries, normal Dec 2016 01/23/2016   Chronic anticoagulation-Xarelto 01/23/2016   NSVT (nonsustained ventricular tachycardia) (HCC) 01/23/2016   Elevated troponin 12/21/2015   Nonischemic cardiomyopathy (HCC)    Obesity, Class III, BMI 40-49.9 (morbid obesity) (HCC)     Noncompliance with diet and medication regimen 12/02/2015   OSA (obstructive sleep apnea) 09/20/2015   Pulmonary hypertension (HCC) 09/19/2015   Normocytic anemia 09/19/2015   Atrial fibrillation, chronic    Dyspnea on exertion 05/28/2015   Acute respiratory failure with hypoxia (HCC) 04/01/2015   Hypokalemia 04/01/2015   Obesity hypoventilation syndrome (HCC) 08/08/2014   GERD (gastroesophageal reflux disease) 08/08/2014   Edema of right lower extremity 06/21/2014   Fecal urgency 06/21/2014   Asthma 02/11/2009   Hyperlipidemia 07/12/2008   OBESITY, MORBID 07/12/2008   Anxiety state 07/12/2008   DEPRESSION 07/12/2008   Essential hypertension 07/12/2008   ALLERGIC RHINITIS 07/12/2008   Peptic ulcer 07/12/2008   OSTEOARTHRITIS 07/12/2008    Past Surgical History:  Procedure Laterality Date   CARDIAC CATHETERIZATION N/A 06/14/2015   Procedure: Right/Left Heart Cath and Coronary Angiography;  Surgeon: Runell GessJonathan J Berry, MD;  [facility];  Service: Cardiovascular;  Laterality: N/A;    Allergies Banana, Bee venom, Strawberry flavor, Aspirin, Metolazone, Oatmeal, Orange juice [orange oil], Torsemide, Diltiazem, Hydralazine, and Lipitor [atorvastatin]  Family History  Problem Relation Age of Onset   Stroke Father    Heart attack Father    Aneurysm Mother        Cerebral aneurysm   Hypertension Sister    Colon cancer Neg Hx    Inflammatory bowel disease Neg Hx    Liver disease Neg Hx     Social History Social History   Tobacco Use   Smoking status: Former Smoker    Packs/day: 0.50    Years: 20.00    Pack years: 10.00    Types: Cigarettes    Start date: 04/26/1988    Quit date: 06/23/2007    Years since quitting: 11.6   Smokeless tobacco: Never Used   Tobacco comment: 1 ppd former smoker  Substance Use Topics   Alcohol use: Not Currently    Alcohol/week: 0.0 standard drinks    Comment: No etoh since 2009   Drug use: No      Review of Systems  Constitutional: No fever/chills Eyes: No visual changes. ENT: No sore throat. Cardiovascular: Positive chest pain. Respiratory: Positive shortness of breath. Gastrointestinal: No abdominal pain.  No nausea, no vomiting.  No diarrhea.  No constipation. Genitourinary: Negative for dysuria. Musculoskeletal: Negative for back pain. Positive right leg pain and swelling.  Skin: Negative for rash. Neurological: Negative for headaches, focal weakness or numbness.  10-point ROS otherwise negative.  ____________________________________________   PHYSICAL EXAM:  VITAL SIGNS: ED Triage Vitals  Enc Vitals Group     BP 02/17/19 0634 (!) 154/99     Pulse Rate 02/17/19 0634 (!) 111     Resp 02/17/19 0634 (!) 24     Temp 02/17/19 0634 98 F (36.7 C)     Temp Source 02/17/19 0634 Oral     SpO2 02/17/19 0634 97 %     Weight 02/17/19 0635 (!) 372 lb (168.7 kg)     Height 02/17/19 0635 6' (1.829 m)   Constitutional: Alert and oriented. Well appearing and in no acute distress. Eyes: Conjunctivae  are normal.  Head: Atraumatic. Nose: No congestion/rhinnorhea. Mouth/Throat: Mucous membranes are moist.  Neck: No stridor.   Cardiovascular: Tachycardia. Good peripheral circulation. Grossly normal heart sounds.   Respiratory: Increased respiratory effort.  No retractions. Lungs CTAB. Gastrointestinal: Soft and nontender. No distention.  Musculoskeletal: Chronic skin changes in the right lower extremity related to edema.  No warmth, redness, drainage.  There is significant right leg swelling compared to the left to the knee.  Neurologic:  Normal speech and language.  Skin:  Skin is warm, dry and intact. No rash noted.  ____________________________________________   LABS (all labs ordered are listed, but only abnormal results are displayed)  Labs Reviewed  COMPREHENSIVE METABOLIC PANEL - Abnormal; Notable for the following components:      Result Value   Potassium 3.3  (*)    Glucose, Bld 115 (*)    Calcium 8.5 (*)    Total Bilirubin 1.3 (*)    All other components within normal limits  CBC WITH DIFFERENTIAL/PLATELET - Abnormal; Notable for the following components:   Hemoglobin 12.9 (*)    All other components within normal limits  BRAIN NATRIURETIC PEPTIDE - Abnormal; Notable for the following components:   B Natriuretic Peptide 382.0 (*)    All other components within normal limits  TROPONIN I (HIGH SENSITIVITY) - Abnormal; Notable for the following components:   Troponin I (High Sensitivity) 26 (*)    All other components within normal limits  TROPONIN I (HIGH SENSITIVITY) - Abnormal; Notable for the following components:   Troponin I (High Sensitivity) 29 (*)    All other components within normal limits  SARS CORONAVIRUS 2 (HOSPITAL ORDER[facility])  LIPASE, BLOOD   ____________________________________________  EKG   EKG Interpretation  Date/Time:  Friday February 17 2019 07:46:03 EDT Ventricular Rate:  90 PR Interval:    QRS Duration: 96 QT Interval:  373 QTC Calculation: 457 R Axis:   53 Text Interpretation:  Atrial fibrillation No significant change from prior.  No STEMI  Confirmed by Alona BeneLong, Erika Hussar [phone]) on 02/17/2019 8:07:54 AM       ____________________________________________  RADIOLOGY  Ct Angio Chest Pe W And/or Wo Contrast  Result Date: 02/17/2019 CLINICAL DATA:  Right lower extremity pain, now with tachypnea and chest pain. Evaluate for pulmonary embolism. EXAM: CT ANGIOGRAPHY CHEST WITH CONTRAST TECHNIQUE: Multidetector CT imaging of the chest was performed using the standard protocol during bolus administration of intravenous contrast. Multiplanar CT image reconstructions and MIPs were obtained to evaluate the vascular anatomy. CONTRAST:  75mL OMNIPAQUE IOHEXOL 350 MG/ML SOLN COMPARISON:  Chest CT-11/05/2015 FINDINGS: Vascular Findings: There is adequate opacification of the pulmonary arterial  system with the main pulmonary artery measuring 220 Hounsfield units. There are no discrete filling defects within the central pulmonary arterial tree to suggest pulmonary embolism. Evaluation of the distal most bilateral subsegmental pulmonary arteries is degraded secondary to a combination of suboptimal vessel opacification as well as quantum mottle artifact due to patient body habitus. Enlarged caliber of the main pulmonary artery measuring 37 mm in diameter, similar to the 2017 examination. Cardiomegaly. Trace amount of fluid seen with the pericardial recess. No pericardial effusion. No evidence of thoracic aortic aneurysm or dissection on this nongated examination. Conventional gray shin of the aortic arch. Minimal amount of atherosclerotic plaque within the aortic arch. Review of the MIP images confirms the above findings. ---------------------------------------------------------------------------------- Nonvascular Findings: Mediastinum/Lymph Nodes: No bulky mediastinal, hilar or axillary lymphadenopathy. Lungs/Pleura: Evaluation of the pulmonary parenchyma  is minimally degraded due to patient motion artifact as well as quantum mottle artifact due to patient body habitus. Overall marked improved aeration of the lungs compared to remote chest CT performed 11/05/2015. Minimal dependent subpleural ground-glass atelectasis. No discrete focal airspace opacities or air bronchograms. No pleural effusion or pneumothorax. The central pulmonary airways appear widely patent. No new pulmonary nodules. Punctate granuloma are again seen within the right upper (image 69, series 6) and lower (images 92 and 101, series 6) lobes, the sequela of prior granulomatous infection. Ill-defined punctate (approximately 4 mm) ground-glass nodule within the left lung apex (image 37, series 6), unchanged compared to the 10/2015 examination. Upper abdomen: Limited early arterial phase evaluation of the upper abdomen demonstrates mild  hepatomegaly with reflux of contrast into the hepatic veins and intrahepatic IVC. Borderline splenomegaly with the spleen measuring 12.7 cm in length. Musculoskeletal: No acute or aggressive osseous abnormalities. Stigmata of DISH within the thoracic spine. Degenerative change of the sternomanubrial joint. Symmetric bilateral gynecomastia. Normal appearance of the imaged portions of the thyroid gland. IMPRESSION: 1. No acute cardiopulmonary disease. Specifically, no evidence of pulmonary embolism to the level the bilateral subsegmental pulmonary arteries. 2. Cardiomegaly with reflux of contrast into the hepatic venous system and intrahepatic IVC, nonspecific though could be seen in the setting of right-sided heart failure. Additionally, there is enlargement of the caliber of the main pulmonary artery which could be seen in the setting of pulmonary arterial hypertension. Further evaluation cardiac echo could be performed as indicated. 3.  Aortic Atherosclerosis (ICD10-I70.0). Electronically Signed   By: Sandi Mariscal M.D.   On: 02/17/2019 10:06   US Venous Img Lower Right (dvt Study)  Result Date: 02/17/2019 CLINICAL DATA:  Right lower extremity edema, pain EXAM: RIGHT LOWER EXTREMITY VENOUS DOPPLER ULTRASOUND TECHNIQUE: Gray-scale sonography with graded compression, as well as color Doppler and duplex ultrasound were performed to evaluate the lower extremity deep venous systems from the level of the common femoral vein and including the common femoral, femoral, profunda femoral, popliteal and calf veins including the posterior tibial, peroneal and gastrocnemius veins when visible. The superficial great saphenous vein was also interrogated. Spectral Doppler was utilized to evaluate flow at rest and with distal augmentation maneuvers in the common femoral, femoral and popliteal veins. COMPARISON:  None. FINDINGS: Contralateral Common Femoral Vein: Respiratory phasicity is normal and symmetric with the symptomatic  side. No evidence of thrombus. Normal compressibility. Common Femoral Vein: No evidence of thrombus. Normal compressibility, respiratory phasicity and response to augmentation. Saphenofemoral Junction: No evidence of thrombus. Normal compressibility and flow on color Doppler imaging. Profunda Femoral Vein: No evidence of thrombus. Normal compressibility and flow on color Doppler imaging. Femoral Vein: No evidence of thrombus. Normal compressibility, respiratory phasicity and response to augmentation. Popliteal Vein: No evidence of thrombus. Normal compressibility, respiratory phasicity and response to augmentation. Calf Veins: No evidence of thrombus. Normal compressibility and flow on color Doppler imaging. Superficial Great Saphenous Vein: No evidence of thrombus. Normal compressibility. Venous Reflux:  Not assessed Other Findings: Large right medial calf sub surface superficial varicosities noted. IMPRESSION: Negative for right lower extremity DVT. Large medial calf sub surface varicosities. Electronically Signed   By: Jerilynn Mages.  Shick M.D.   On: 02/17/2019 09:39    ____________________________________________   PROCEDURES  Procedure(s) performed:   Procedures  None ____________________________________________   INITIAL IMPRESSION / ASSESSMENT AND PLAN / ED COURSE  Pertinent labs & imaging results that were available during my care of the patient were reviewed by me  and considered in my medical decision making (see chart for details).   Patient presents to the emergency department for evaluation of right leg pain/swelling with associated chest discomfort and shortness of breath.  Patient is on Xarelto but missing doses occasionally.  Suspicion for DVT/PE is somewhat elevated.  Patient also with history of nonischemic cardiomyopathy and could be asymmetric edema with new pulmonary component.  The cardiology note from yesterday describes a 13 pound weight gain since his admission earlier this month.   Will obtain right lower extremity ultrasound, chest x-ray, labs.  Patient took extra Lasix immediately prior to ED presentation and has had almost 500 mL's of urine output since arrival so we will hold on additional Lasix for now.   Labs, [place], and CT imaging reviewed. No acute findings. Patient will continue to diurese at home. Large vol urine output in the ED. No dyspnea. Will f/u with PCP and Cardiologist.  ____________________________________________  FINAL CLINICAL IMPRESSION(S) / ED DIAGNOSES  Final diagnoses:  Precordial chest pain  Right leg pain  Right leg swelling     MEDICATIONS GIVEN DURING THIS VISIT:  Medications  morphine 4 MG/ML injection 4 mg (4 mg Intravenous Given 02/17/19 0747)  iohexol (OMNIPAQUE) 350 MG/ML injection 75 mL (75 mLs Intravenous Contrast Given 02/17/19 0942)     NEW OUTPATIENT MEDICATIONS STARTED DURING THIS VISIT:  Discharge Medication List as of 02/17/2019 10:29 AM    START taking these medications   Details  lidocaine (XYLOCAINE) 5 % ointment Apply 1 application topically 3 (three) times daily as needed., Starting Fri 02/17/2019, Normal        Note:  This document was prepared using Dragon voice recognition software and may include unintentional dictation errors.  Alona Bene, MD Emergency Medicine    [facility], Arlyss Repress, MD 02/18/19 [phone]

## 2019-02-17 NOTE — ED Triage Notes (Signed)
Pt arrived from home via [facility] c/o right lower leg pain. Pt presents with tachypnea and also states he is having some chest pain but his leg pain is worse. Pt +OX4 but is having difficulty ambulating due to his pain.

## 2019-02-17 NOTE — ED Notes (Signed)
Pt states he took 80mg  of lasix at home prior to arrival

## 2019-02-23 ENCOUNTER — Other Ambulatory Visit: Payer: Self-pay

## 2019-02-23 NOTE — Patient Outreach (Signed)
[facility]) Care Management  02/23/2019  KEEFER SOULLIERE 10/09/1965 [id number]   Attempted outreach with Mr. Frasier. Member in the home indicated that he was out at the time of the call.  Per chart review, he has not been evaluated in the ED since 02/17/19. Will follow-up to confirm appointment with Neurology.   PLAN -Will continue routine outreach.   [place] [facility] Management [phone]

## 2019-02-25 DIAGNOSIS — R0902 Hypoxemia: Secondary | ICD-10-CM | POA: Diagnosis not present

## 2019-02-25 DIAGNOSIS — R404 Transient alteration of awareness: Secondary | ICD-10-CM | POA: Diagnosis not present

## 2019-02-25 DIAGNOSIS — I499 Cardiac arrhythmia, unspecified: Secondary | ICD-10-CM | POA: Diagnosis not present

## 2019-02-25 DIAGNOSIS — R079 Chest pain, unspecified: Secondary | ICD-10-CM | POA: Diagnosis not present

## 2019-03-02 ENCOUNTER — Ambulatory Visit: Payer: Self-pay | Admitting: Pharmacist

## 2019-03-02 ENCOUNTER — Other Ambulatory Visit: Payer: Self-pay | Admitting: Pharmacist

## 2019-03-02 NOTE — Patient Outreach (Signed)
[facility]) Care Management  03/02/2019  [name] December 15, 1965 [id number]  Called patient for follow up today. His mother answered the phone and said the patient passed away on Oct 31, 2022 night. She said he had a heart attack.  Before marking the patient deceased in the chart, patient's PCP's office (Dr. Maudie Mercury) was called. Spoke with Dr. Julianne Rice nurse, Magda Paganini who confirmed the patient expired and that they were notified by the funeral home.  Plan: Close patient's pharmacy case. Route note to [facility] Nurse, Darryl Nestle, RN.

## 2019-03-03 ENCOUNTER — Ambulatory Visit: Payer: Self-pay

## 2019-03-12 DIAGNOSIS — J449 Chronic obstructive pulmonary disease, unspecified: Secondary | ICD-10-CM | POA: Diagnosis not present

## 2019-03-23 DEATH — deceased

## 2019-04-26 ENCOUNTER — Ambulatory Visit: Payer: Medicare Other | Admitting: Student

## 2019-05-16 IMAGING — DX DG CHEST 2V
2 series · 2 of 2 positions shown · non-contrast
Comparison: Radiograph 09/03/2017 at [HOSPITAL] [place]

CLINICAL DATA: Shortness of breath and mid chest pain.

EXAM:
CHEST - 2 VIEW

[chest lat]
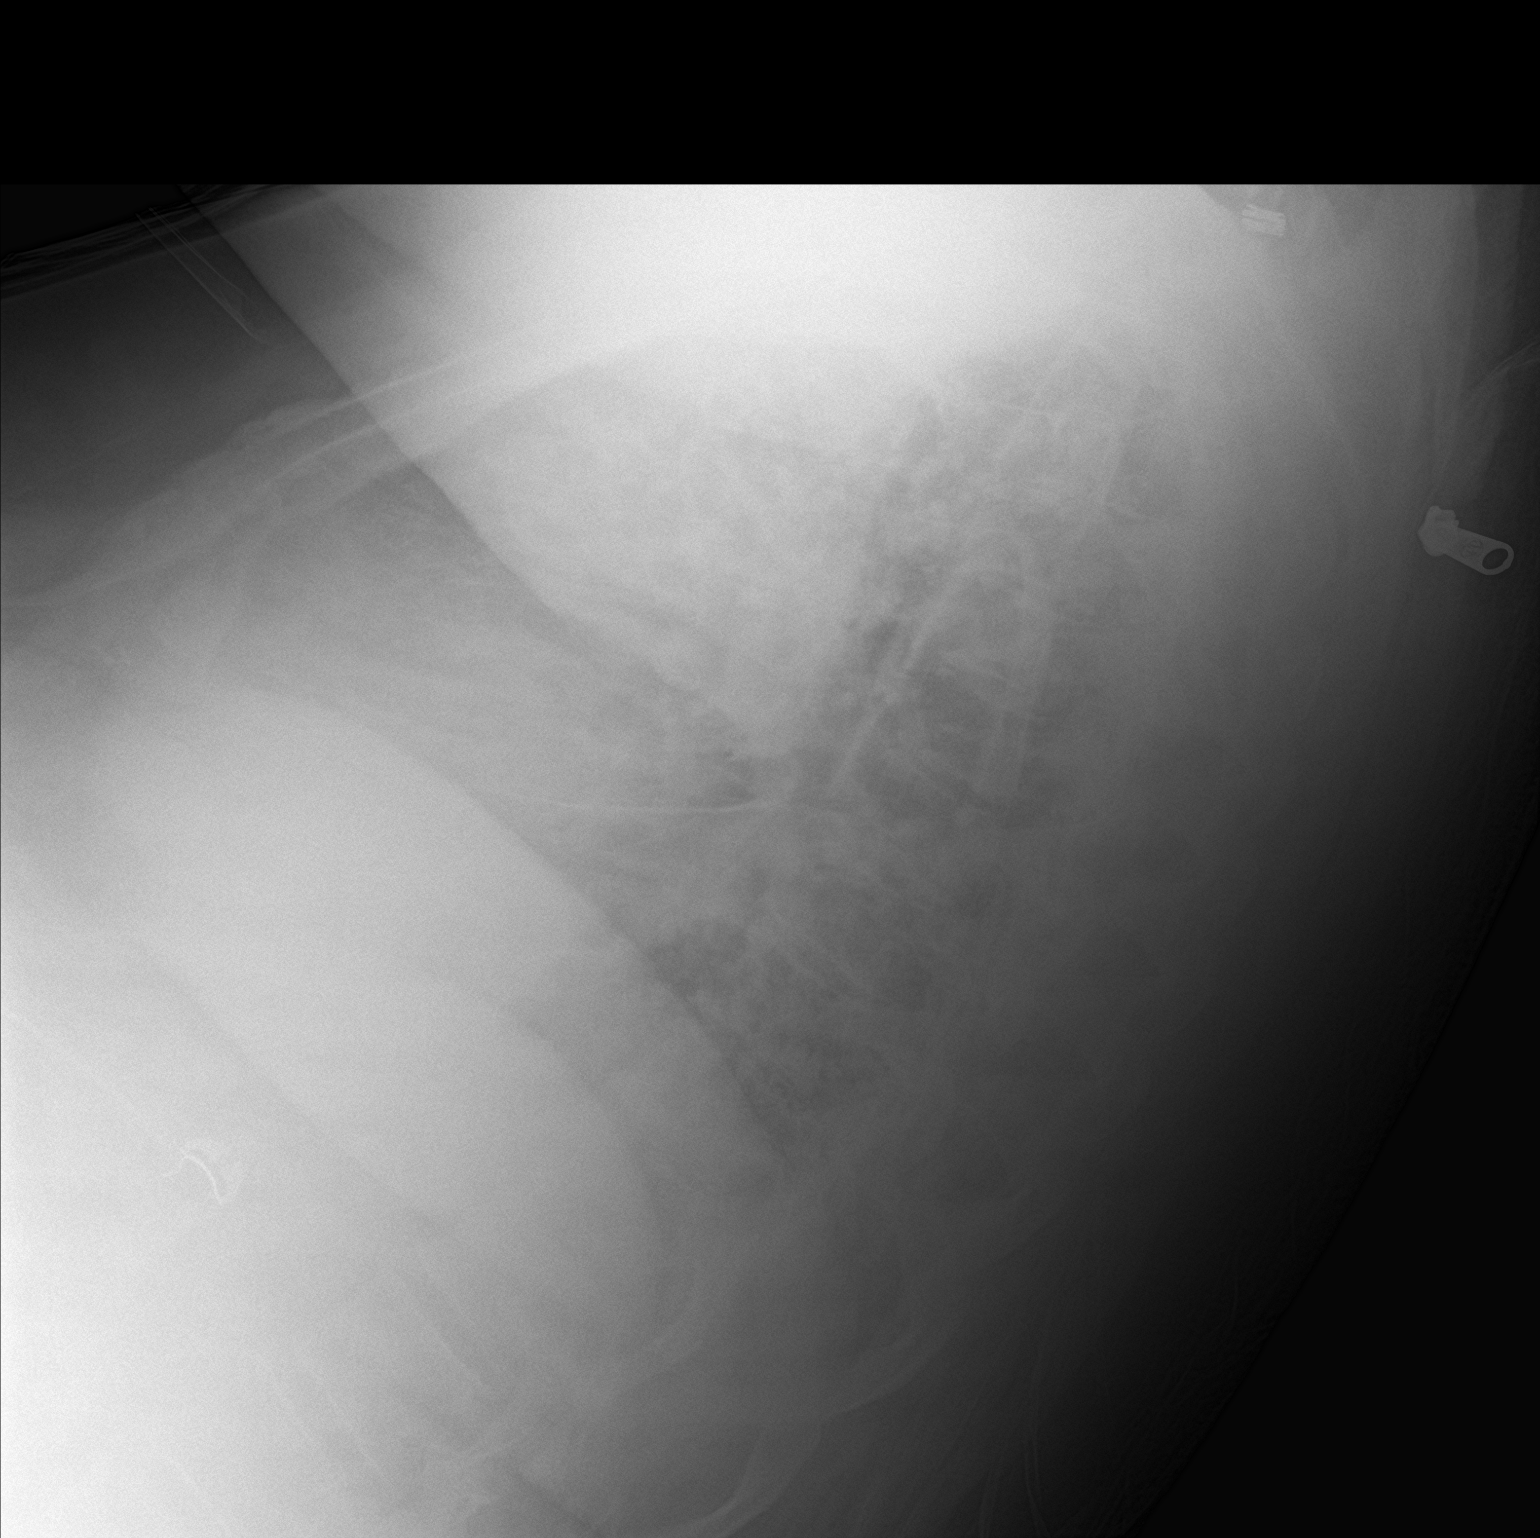

[chest ap]
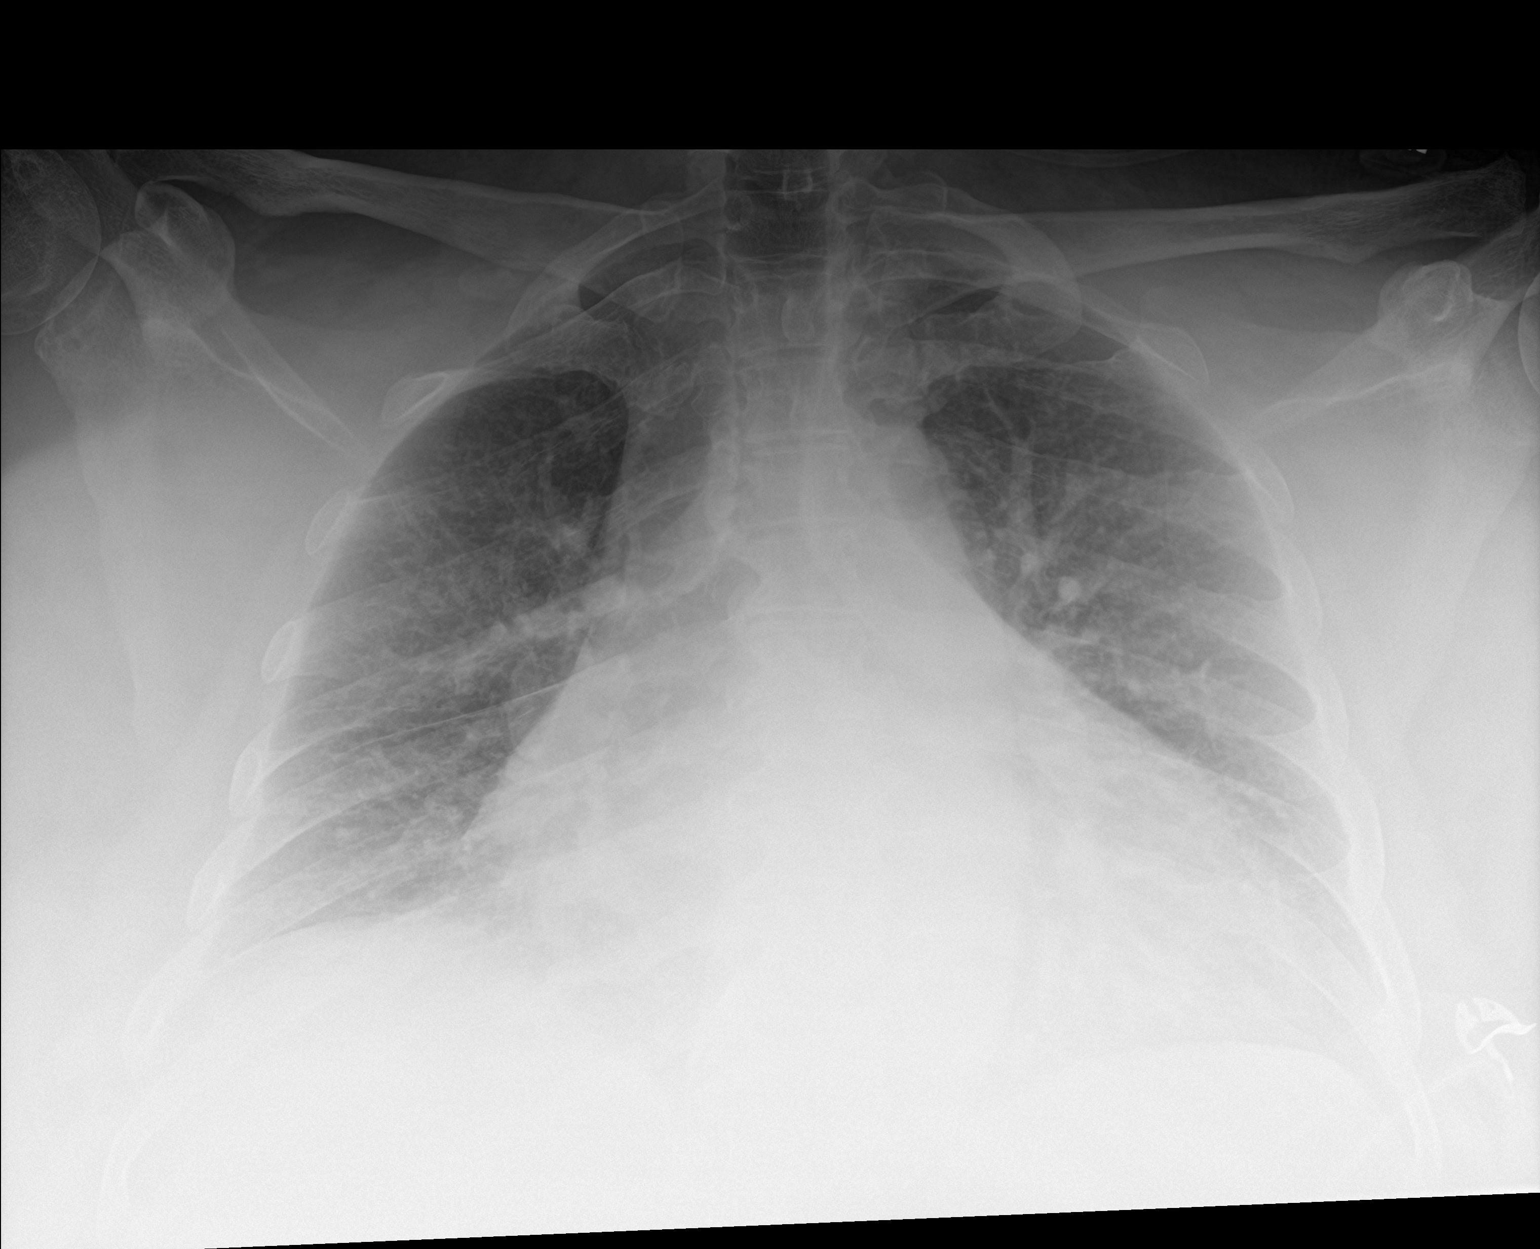

[2 of 2 positions shown; findings below may reference images not displayed]

FINDINGS: Cardiomegaly is unchanged. Stable pulmonary vasculature with
vascular congestion. No confluent airspace disease, pleural effusion
or pneumothorax. Soft tissue attenuation from habitus limits
assessment.
IMPRESSION: Unchanged cardiomegaly and vascular congestion. No acute
abnormality.

## 2019-11-15 IMAGING — DX DG CHEST 2V
2 series · 2 of 2 positions shown · non-contrast
Comparison: None.

CLINICAL DATA: Weakness and dyspnea x1 week.

EXAM:
CHEST - 2 VIEW

[chest pa]
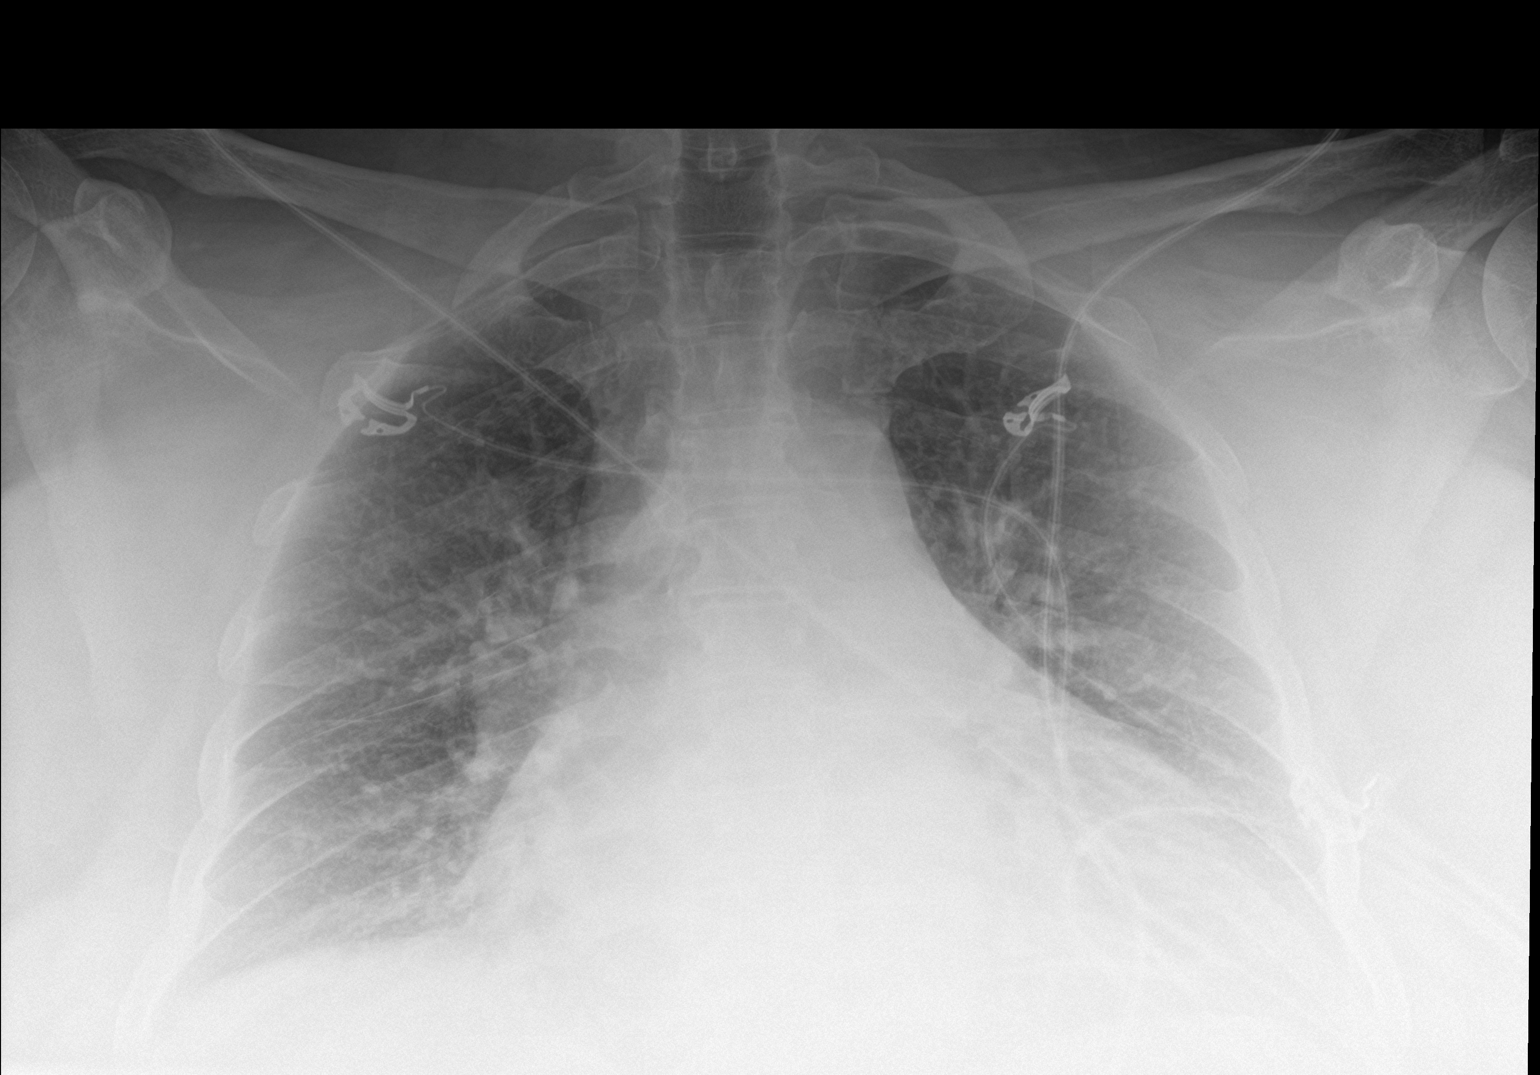

[chest lat]
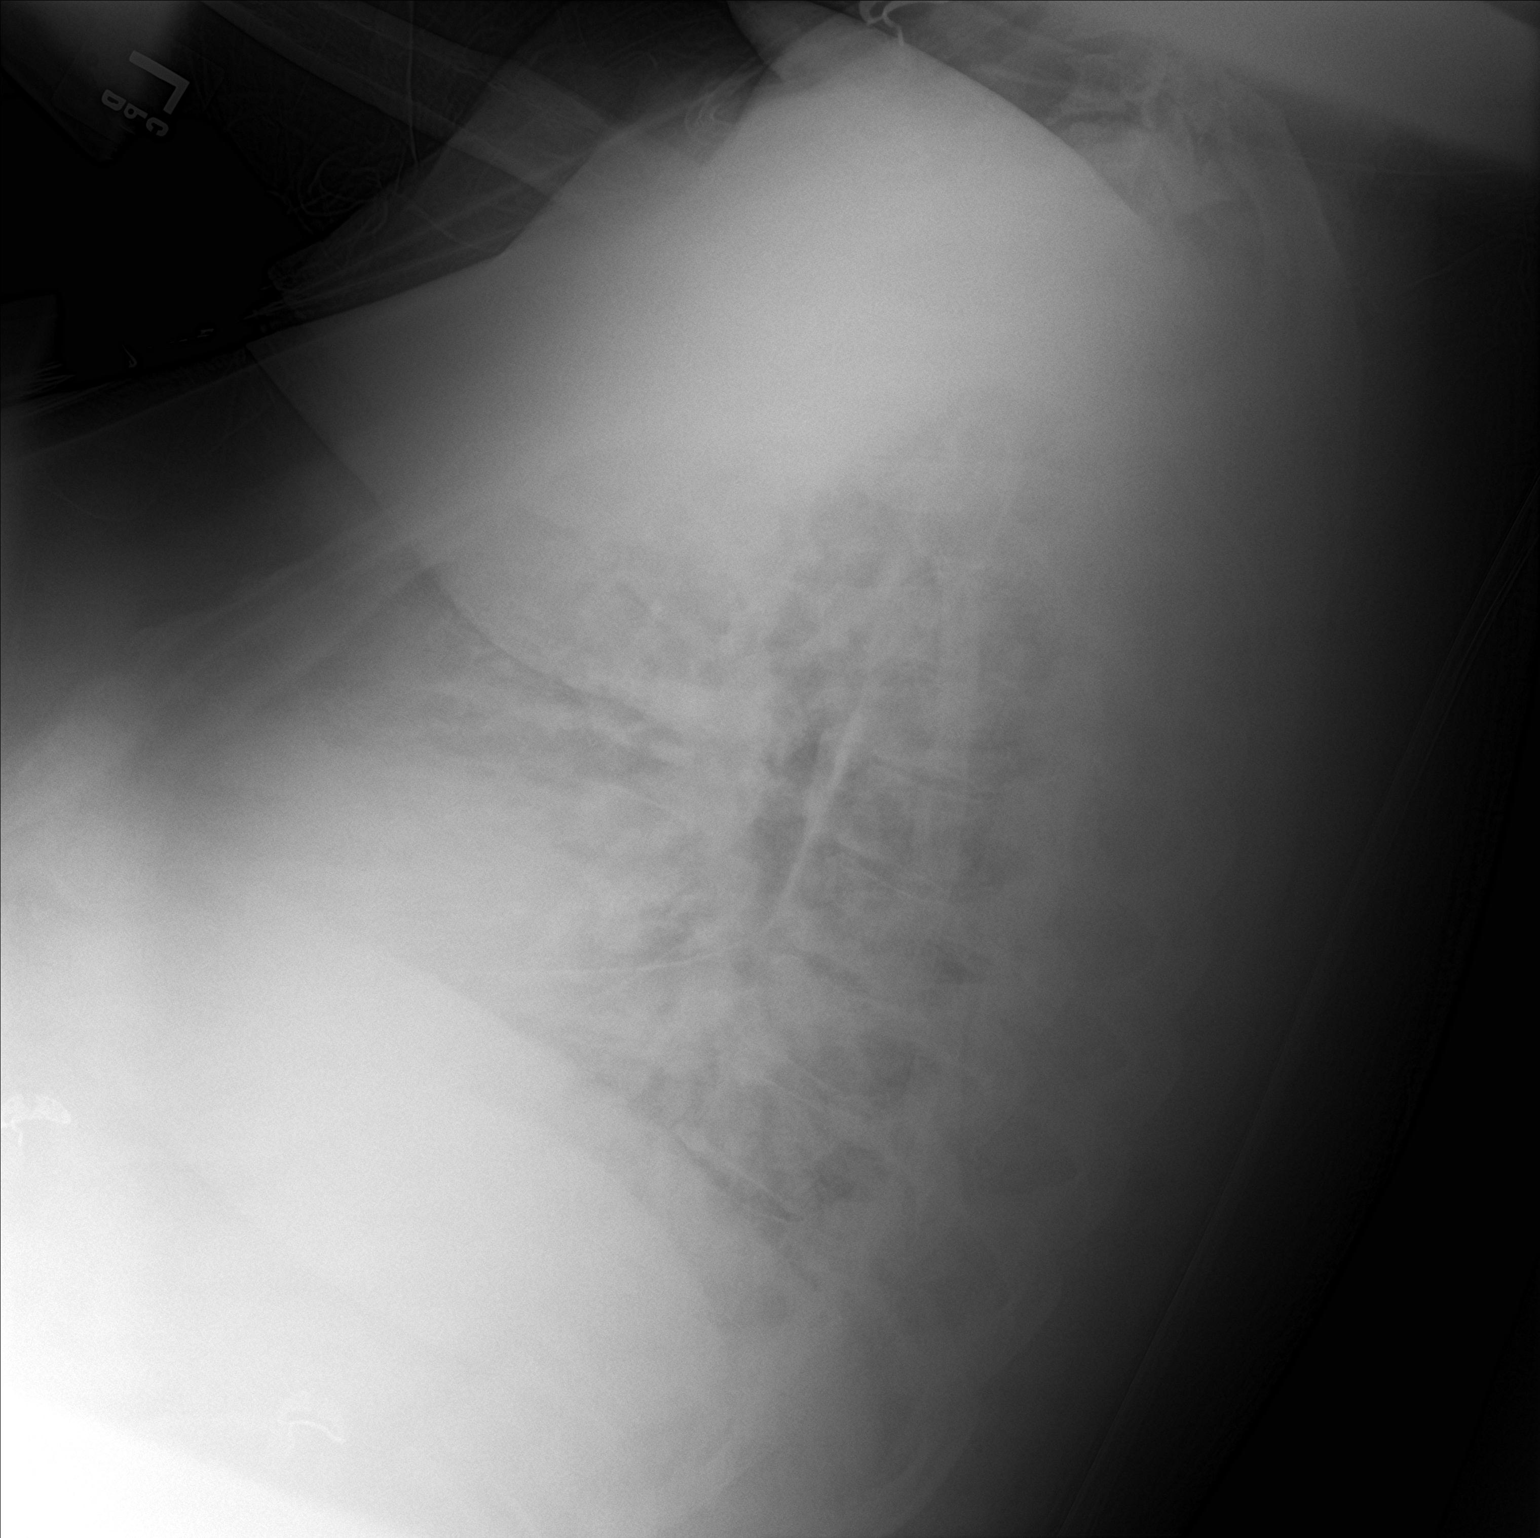

[2 of 2 positions shown; findings below may reference images not displayed]

FINDINGS: Stable cardiomegaly and mediastinal contours. Mild to moderate
central pulmonary vascular congestion. No acute osseous
abnormality..
IMPRESSION: Cardiomegaly with stigmata of mild CHF.

## 2019-12-07 IMAGING — CR DG CHEST 1V PORT
1 series · 1 of 1 positions shown · non-contrast
Comparison: 03/10/2018

CLINICAL DATA: Increasing shortness of breath. History of asthma,
congestive heart failure, hypertension, former smoker.

EXAM:
PORTABLE CHEST 1 VIEW

[ap]
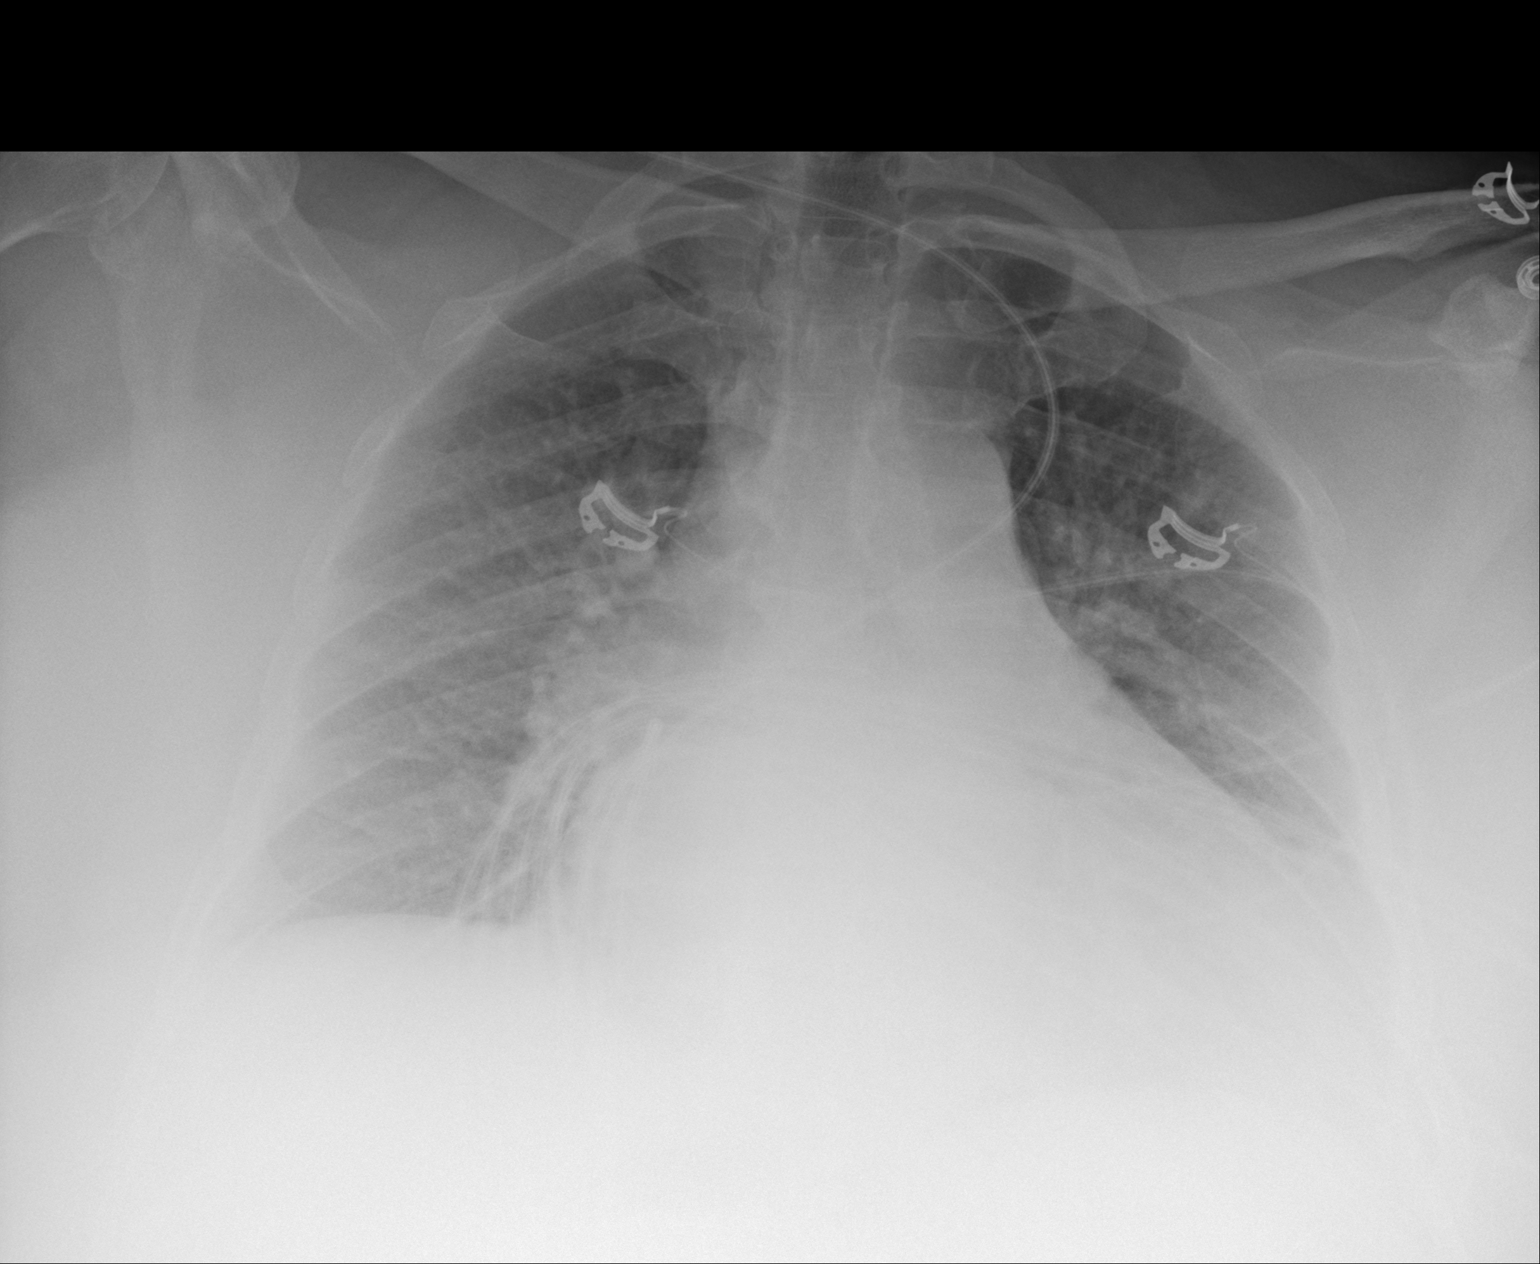

[1 of 1 positions shown; findings below may reference images not displayed]

FINDINGS: Cardiac enlargement with mild pulmonary vascular congestion. No
definite edema or consolidation. No blunting of costophrenic angles.
No pneumothorax. Mediastinal contours appear intact.
IMPRESSION: Cardiac enlargement with mild pulmonary vascular congestion. No
edema or consolidation.

## 2020-02-16 IMAGING — CR DG CHEST 1V PORT
1 series · 1 of 1 positions shown · non-contrast
Comparison: Chest radiograph April 01, 2018

CLINICAL DATA: Shortness of breath.  History of cardiomyopathy.

EXAM:
PORTABLE CHEST 1 VIEW

[portable]
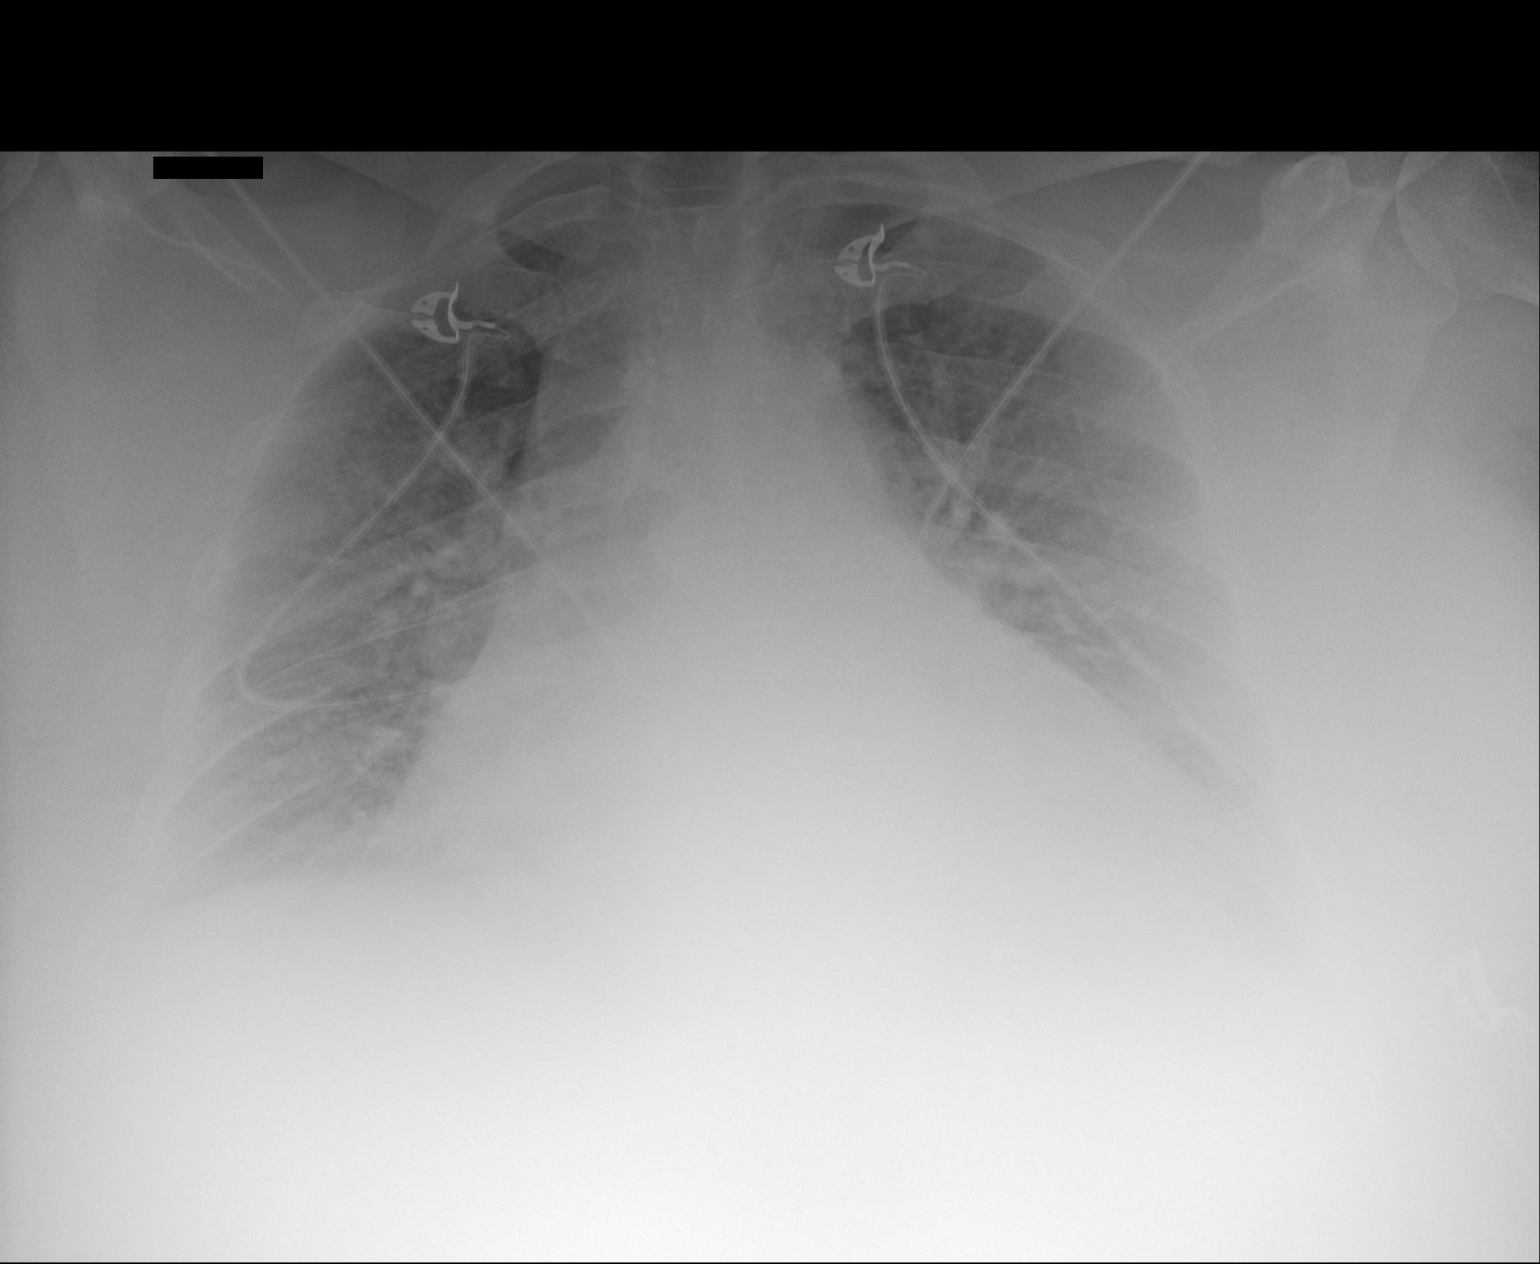

[1 of 1 positions shown; findings below may reference images not displayed]

FINDINGS: Cardiac silhouette is similarly enlarged. Pulmonary vascular
congestion and interstitial prominence. No pleural effusion or focal
consolidation. No pneumothorax. Large body habitus. Osseous
structures are non suspicious.
IMPRESSION: 1. Similar cardiomegaly.  Increased interstitial edema.

## 2020-03-08 IMAGING — CR DG CHEST 1V PORT
1 series · 2 of 2 positions shown · non-contrast
Comparison: 06/27/2018

CLINICAL DATA: Shortness of breath starting today. Lethargic but
arousable. Lower extremity edema.

EXAM:
PORTABLE CHEST 1 VIEW

[Series 1: portable · 0.17mm/px · 2 of 2 slices shown]
[im 1/2]
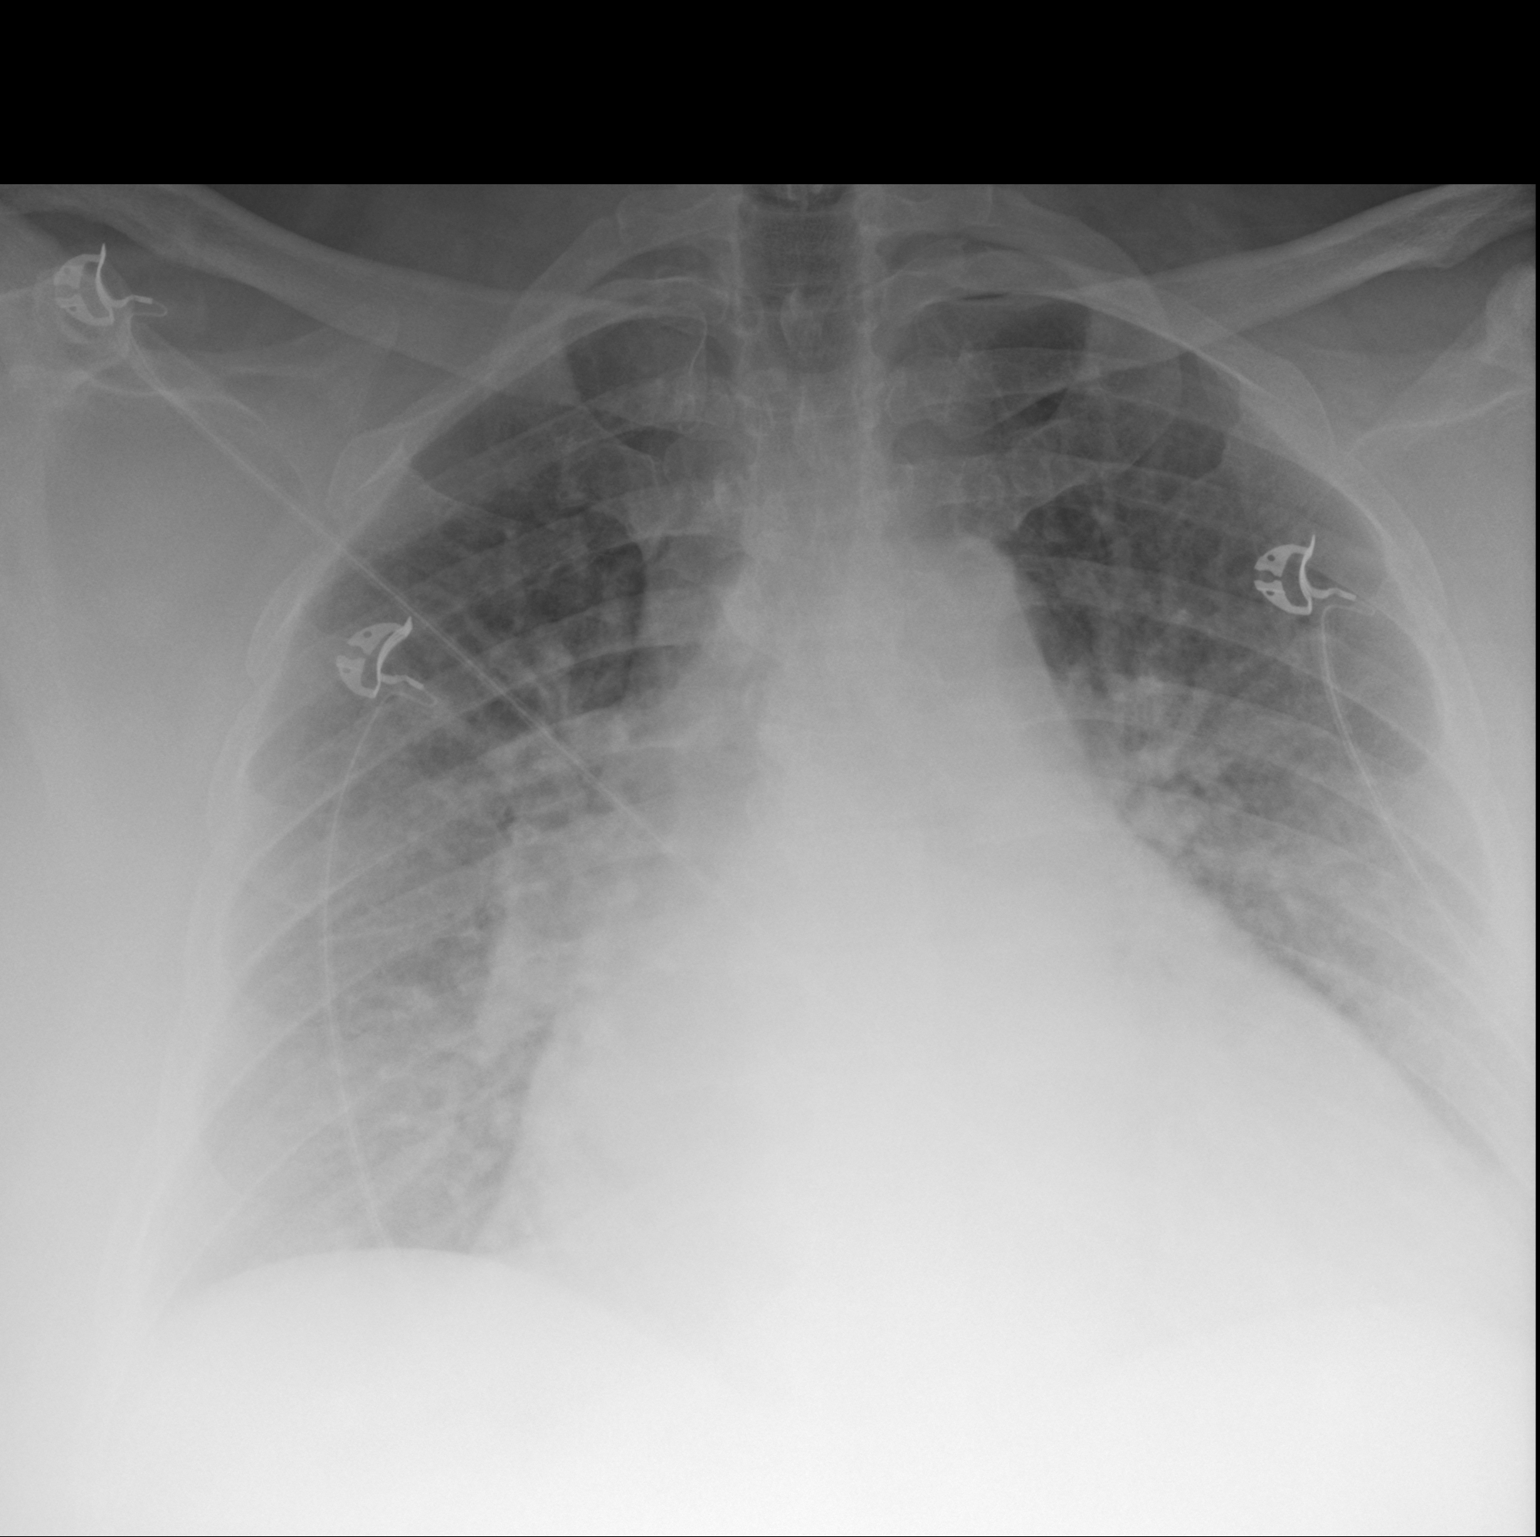
[im 2/2]
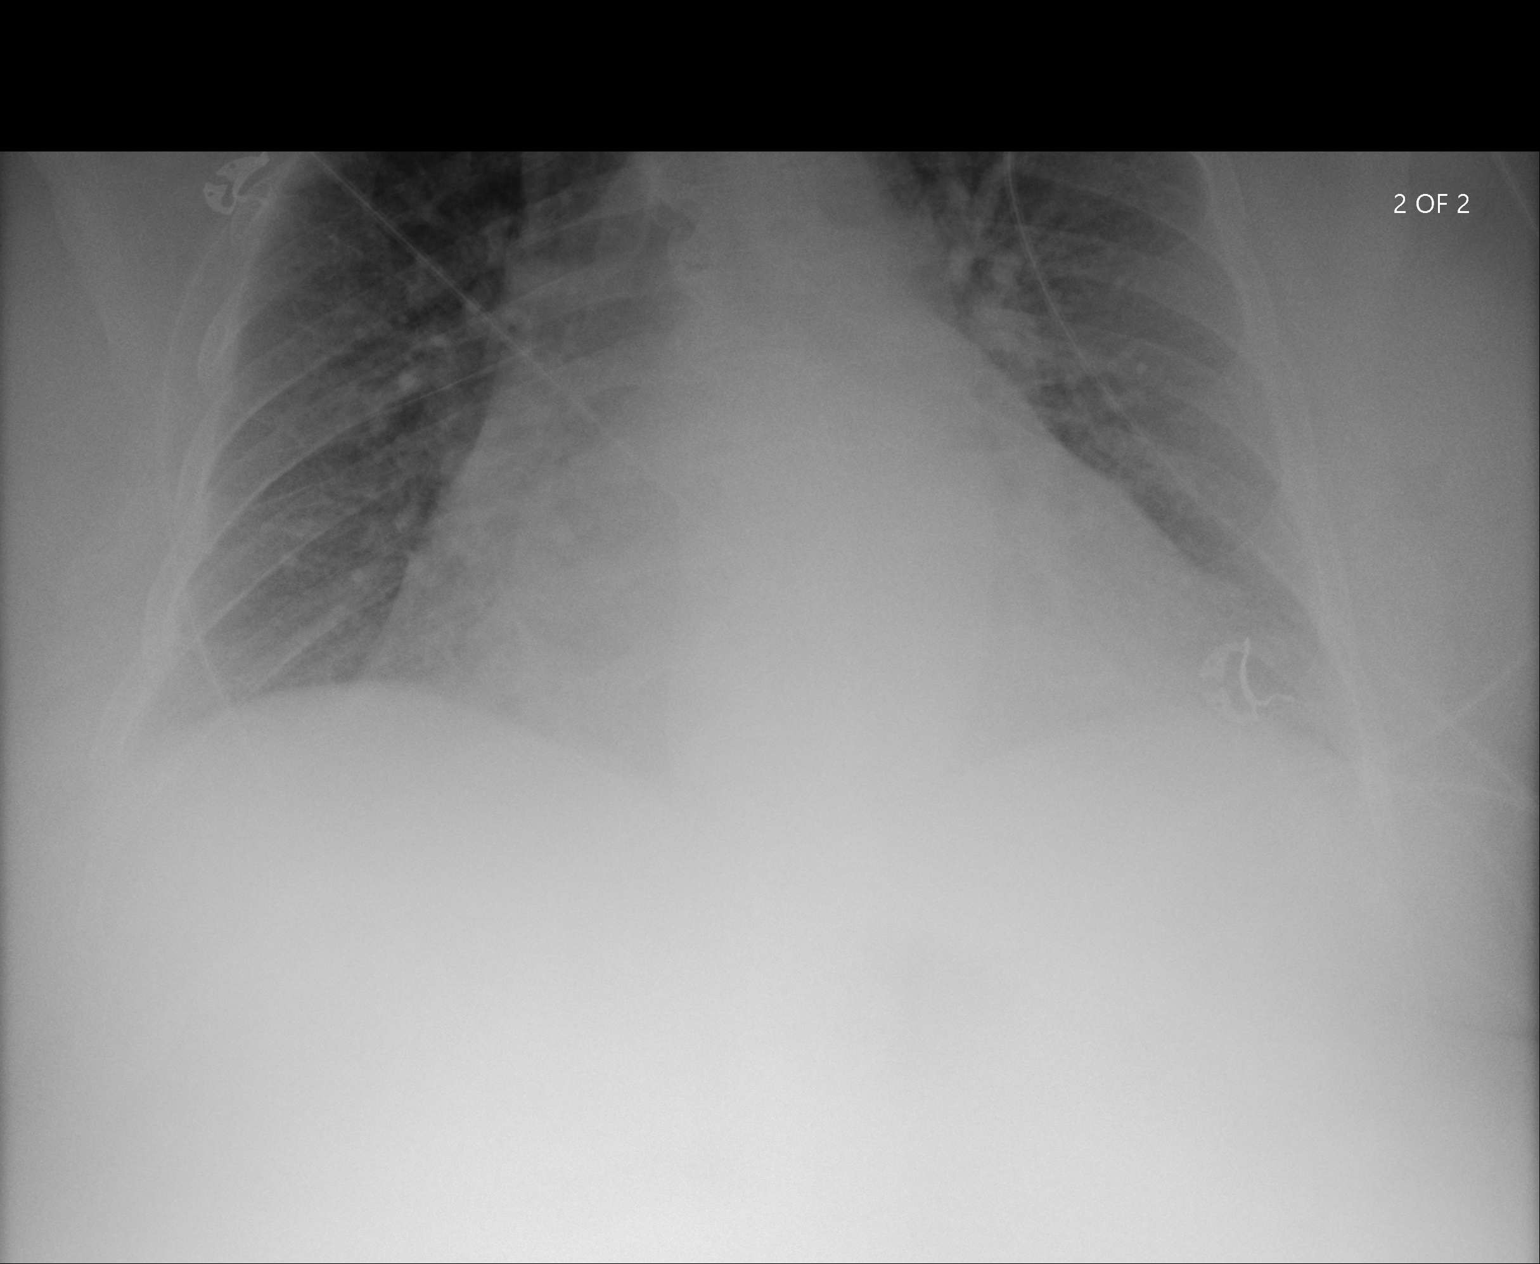

[2 of 2 positions shown; findings below may reference images not displayed]

FINDINGS: Cardiac enlargement. Pulmonary vascular congestion with perihilar
infiltration, likely edema. No blunting of costophrenic angles. No
pneumothorax. Mediastinal contours appear intact.
IMPRESSION: Cardiac enlargement with pulmonary vascular congestion and perihilar
edema.

## 2020-03-31 IMAGING — CR DG CHEST 1V PORT
1 series · 1 of 1 positions shown · non-contrast
Comparison: 07/22/2018

CLINICAL DATA: Acute systolic CHF

EXAM:
PORTABLE CHEST 1 VIEW

[portable]
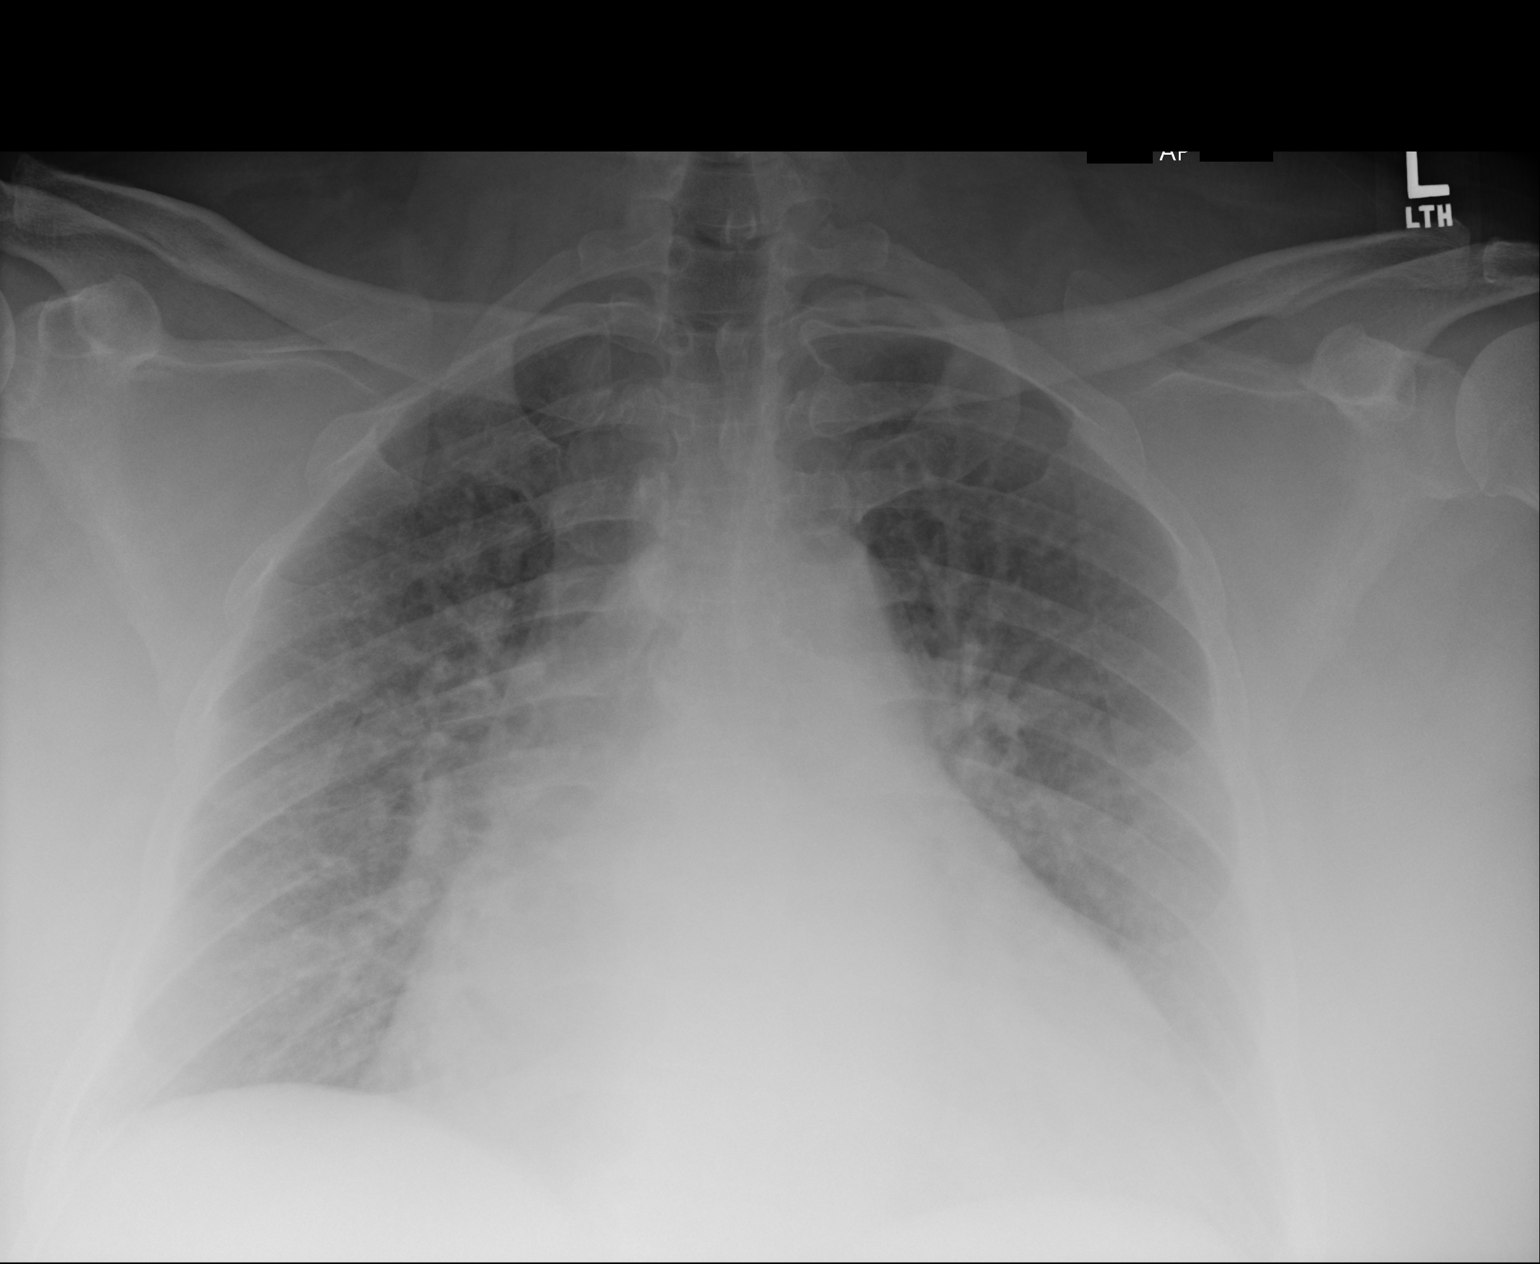

[1 of 1 positions shown; findings below may reference images not displayed]

FINDINGS: Chronic cardiomegaly and vascular pedicle widening. Vascular
congestion without Kerley lines or effusion. No pneumothorax
IMPRESSION: Cardiomegaly and vascular congestion.

## 2020-04-09 IMAGING — CR DG CHEST 1V PORT
1 series · 1 of 1 positions shown · non-contrast
Comparison: 07/25/2018, 07/22/2018, 07/21/2018

CLINICAL DATA: 52-year-old male with a history of leg edema
shortness of breath

EXAM:
PORTABLE CHEST 1 VIEW

[ap]
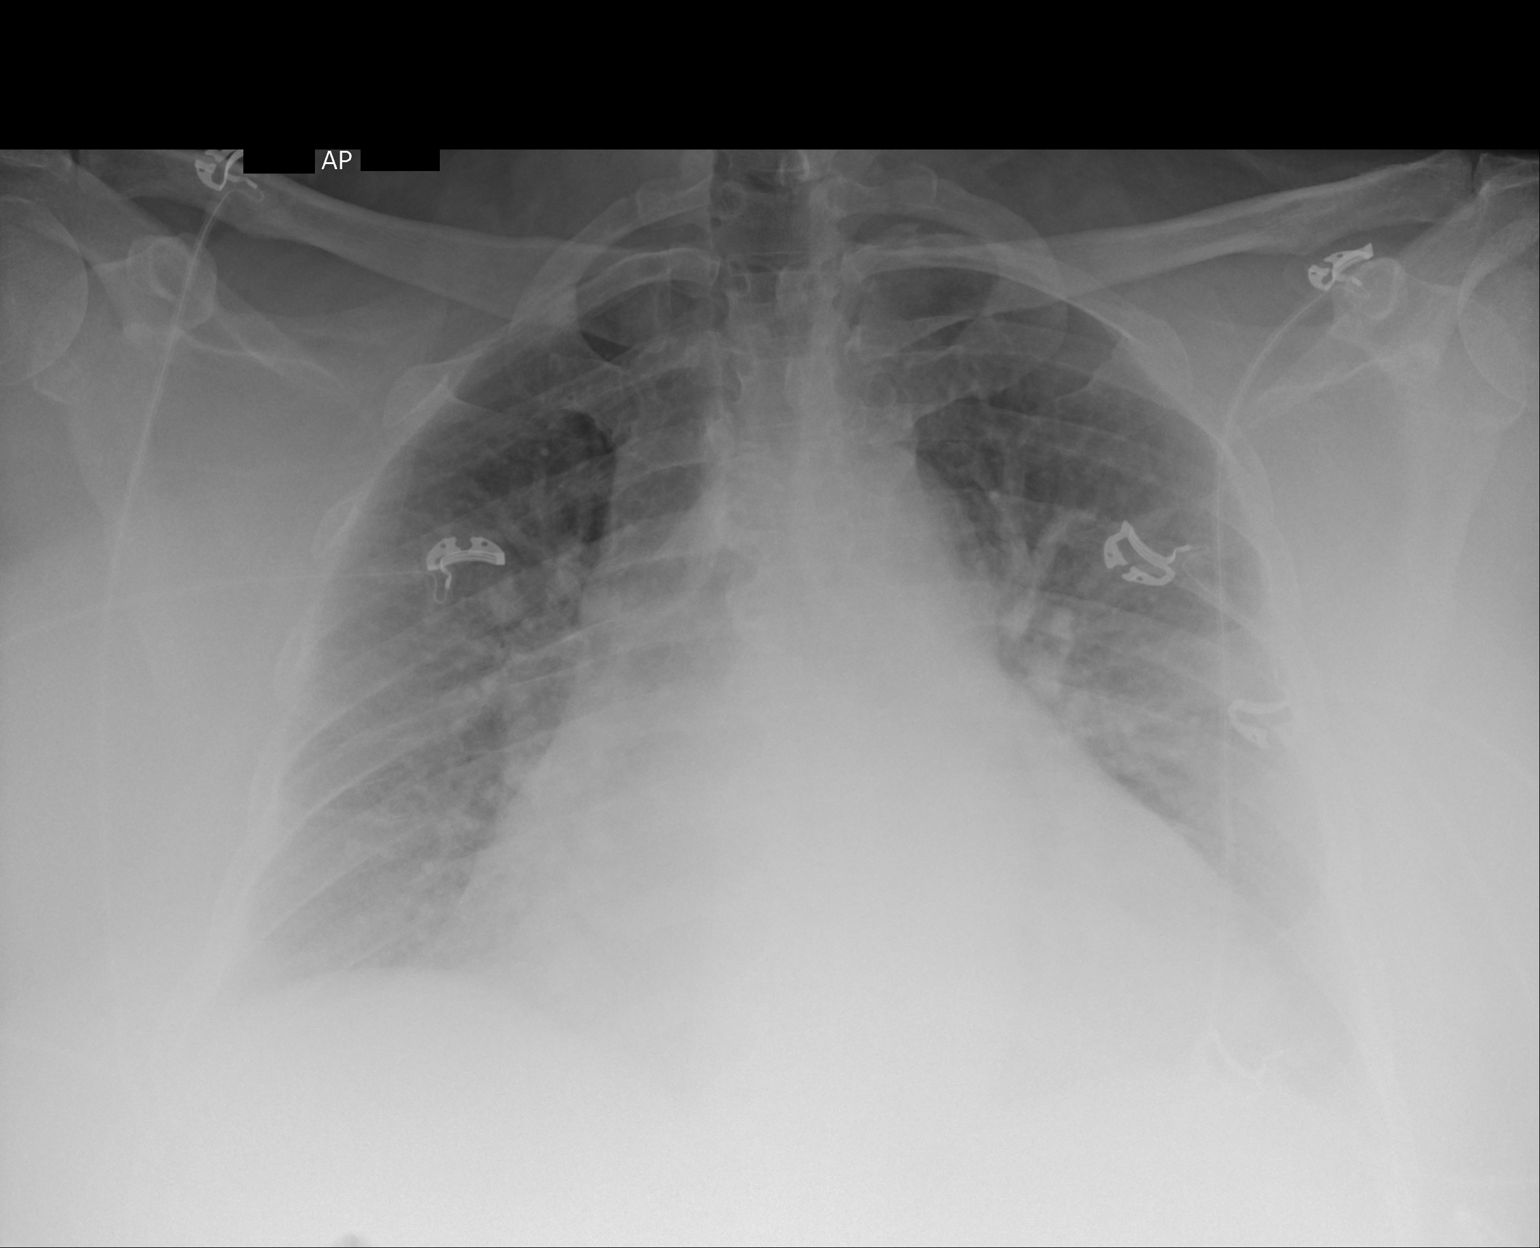

[1 of 1 positions shown; findings below may reference images not displayed]

FINDINGS: Cardiomediastinal silhouette unchanged with cardiomegaly. Fullness
in the central vasculature with interlobular septal thickening. No
pneumothorax. No large pleural effusion.

No displaced fracture.
IMPRESSION: Cardiomegaly with evidence of mild pulmonary edema. Atypical
infection not excluded.

## 2020-04-21 IMAGING — DX DG CHEST 2V
2 series · 2 of 2 positions shown · non-contrast
Comparison: 08/03/2018 chest radiograph

CLINICAL DATA: 52 y/o M; shortness of breath, history of heart
failure.

EXAM:
CHEST - 2 VIEW

[chest lat]
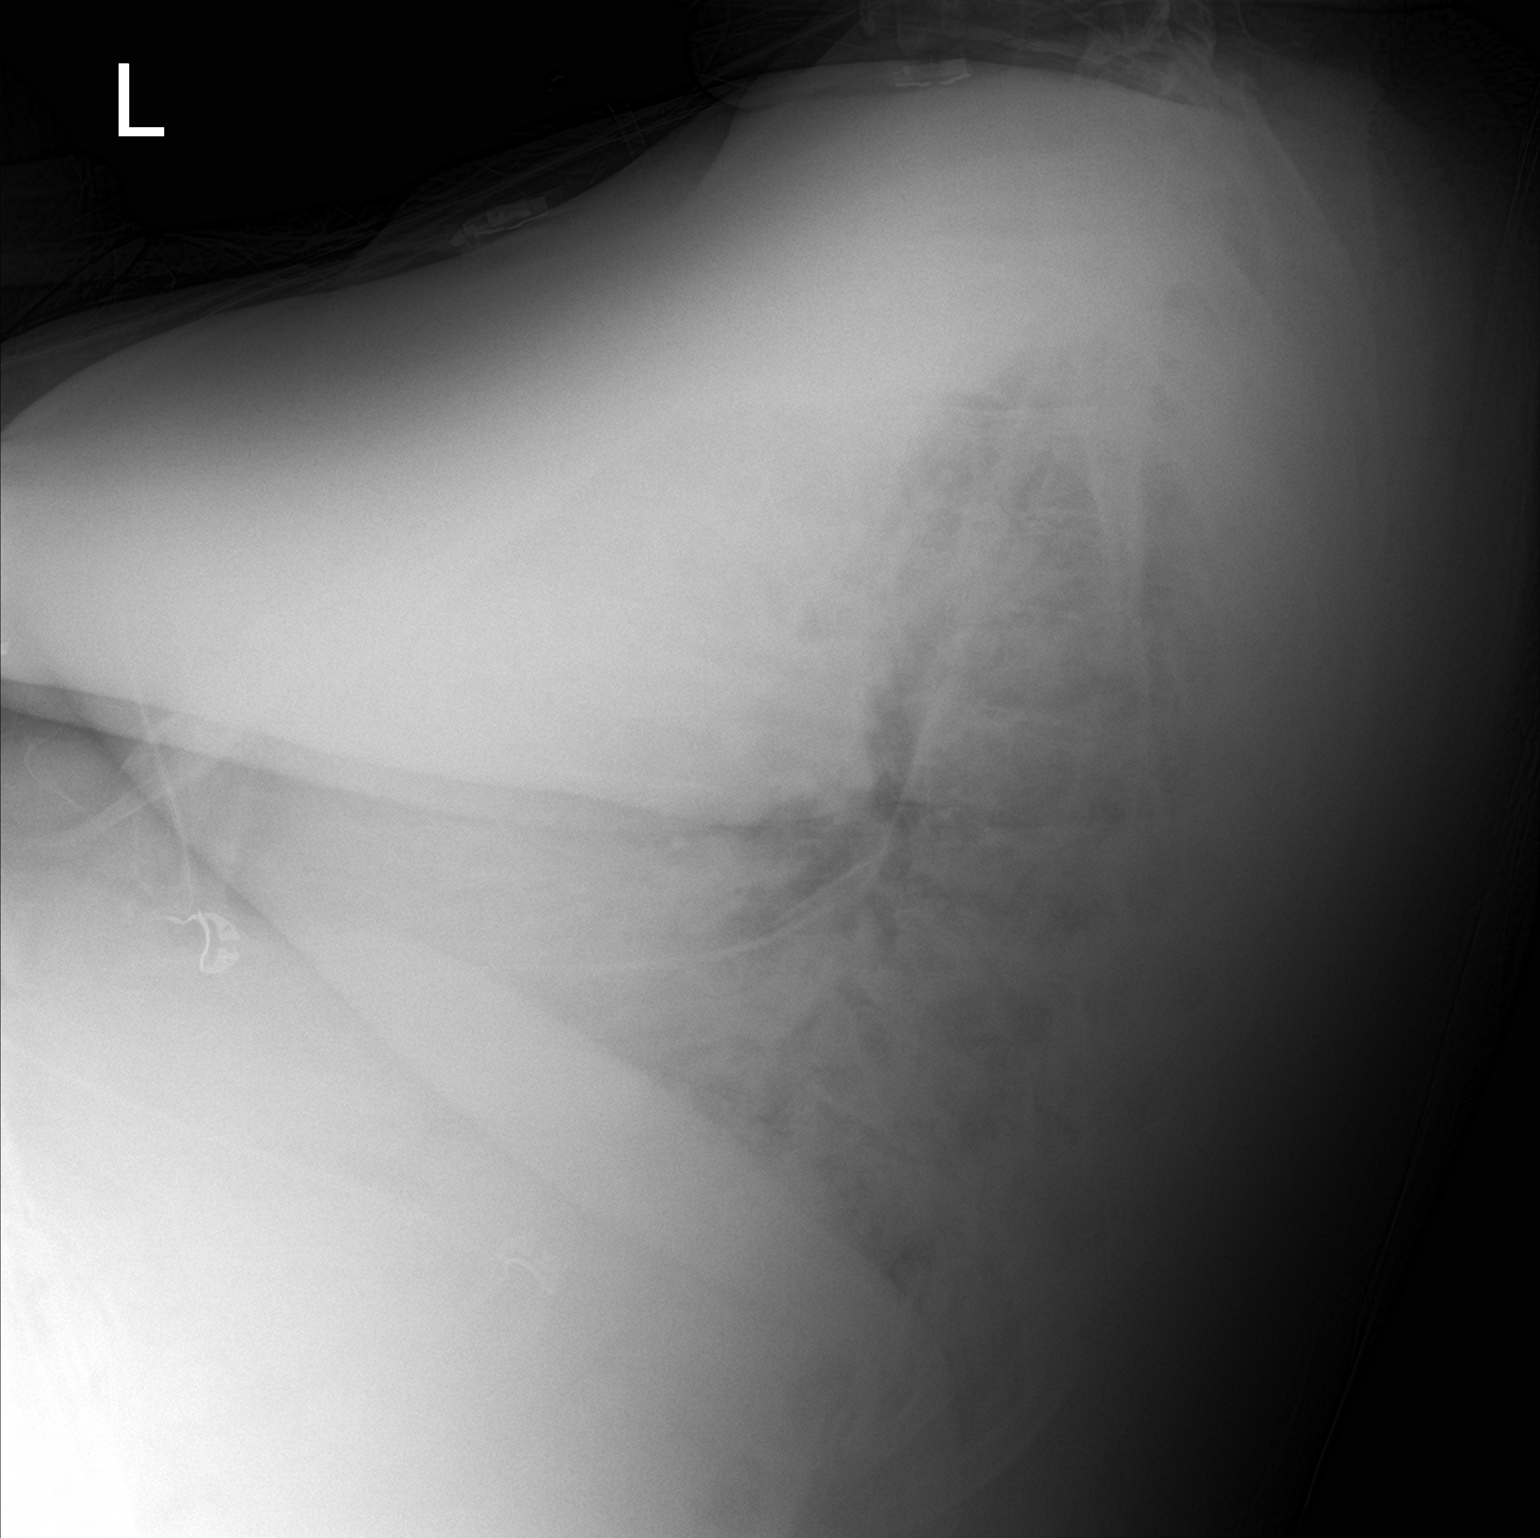

[chest ap]
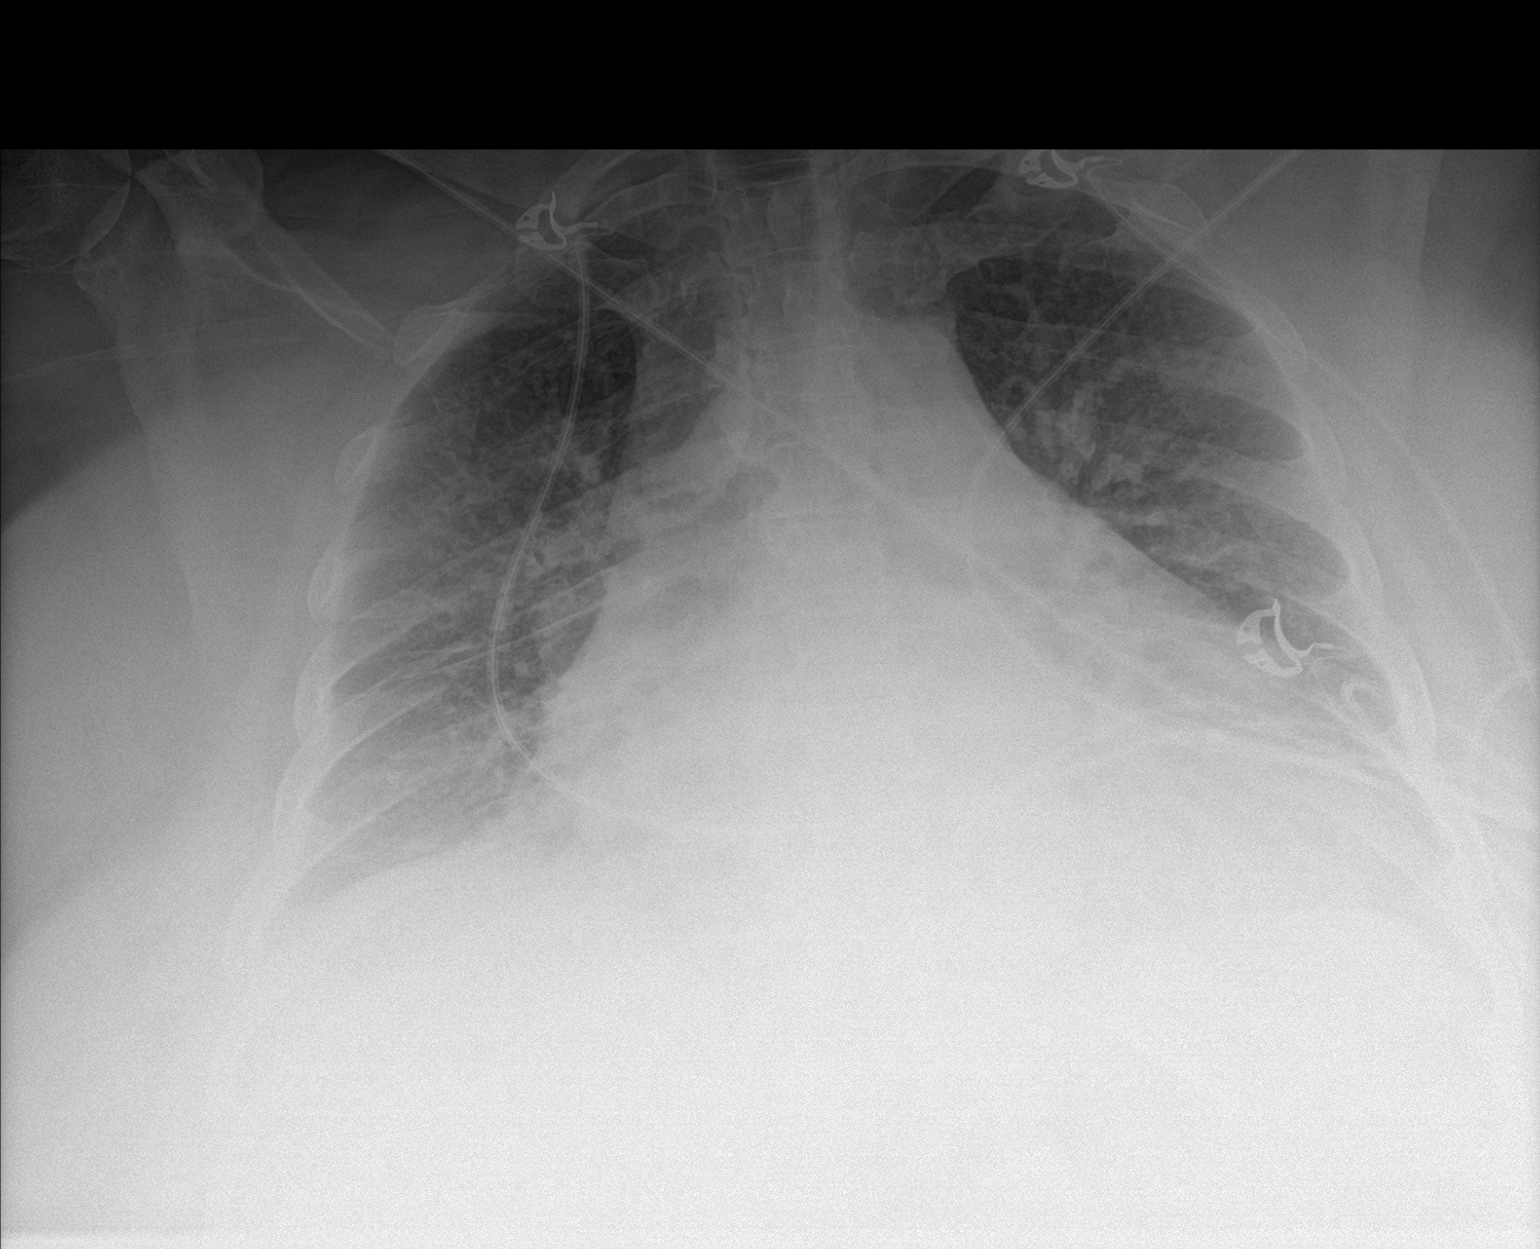

[2 of 2 positions shown; findings below may reference images not displayed]

FINDINGS: Stable cardiomegaly given projection and technique. Diffuse
reticular and hazy basilar opacities of the lungs. Possible small
right effusion. No pneumothorax. No acute osseous abnormality is
evident.
IMPRESSION: Cardiomegaly, interstitial pulmonary edema, and mild alveolar edema.
Possible small right effusion.

## 2020-05-07 IMAGING — CR PORTABLE CHEST - 1 VIEW
1 series · 1 of 1 positions shown · non-contrast
Comparison: 08/15/2018

CLINICAL DATA: Shortness of breath and lower leg swelling for 2
days. Worsening. History of CHF, hypertension, asthma, atrial
fibrillation, former smoker.

EXAM:
PORTABLE CHEST 1 VIEW

[ap]
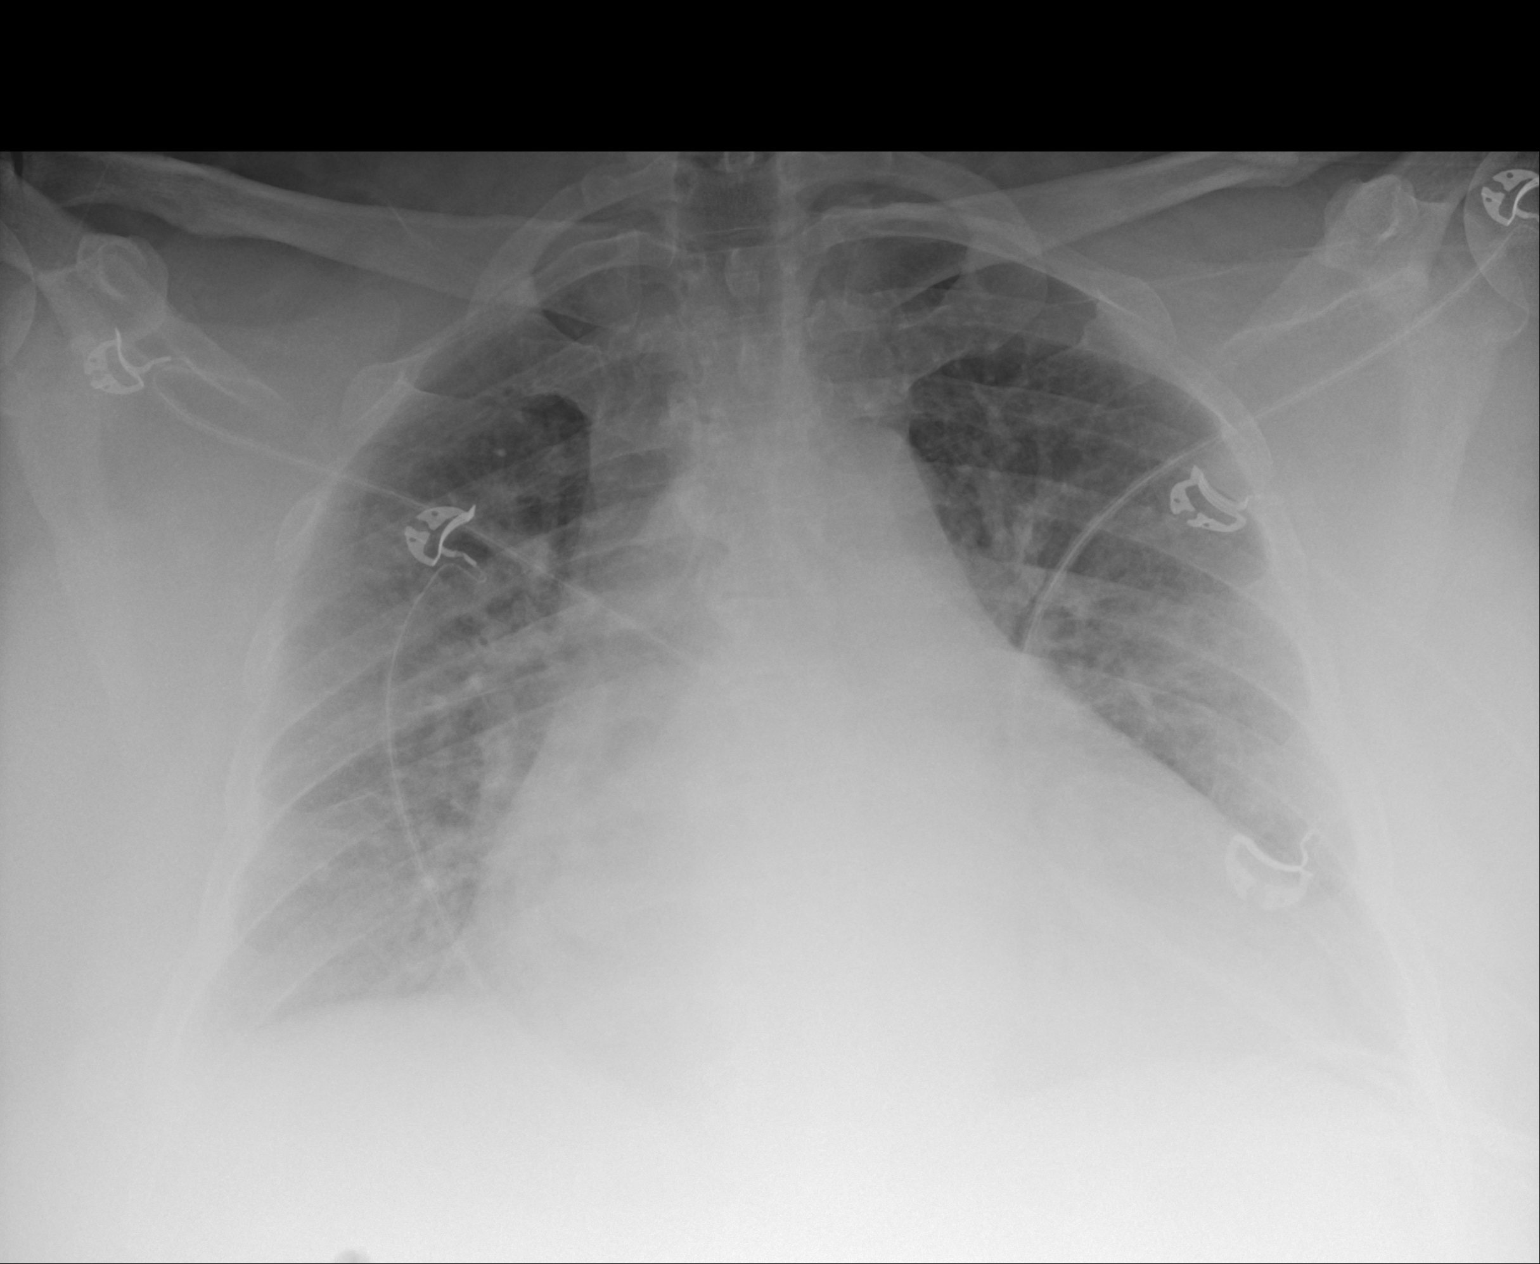

[1 of 1 positions shown; findings below may reference images not displayed]

FINDINGS: Cardiac enlargement with pulmonary vascular congestion. Perihilar
infiltration suggesting edema. No blunting of costophrenic angles.
No pneumothorax. Mediastinal contours appear intact.
IMPRESSION: Cardiac enlargement with pulmonary vascular congestion and perihilar
edema.

## 2020-05-25 IMAGING — CR PORTABLE CHEST - 1 VIEW
1 series · 1 of 1 positions shown · non-contrast
Comparison: 08/31/2018

CLINICAL DATA: Shortness of breath

EXAM:
PORTABLE CHEST 1 VIEW

[portable]
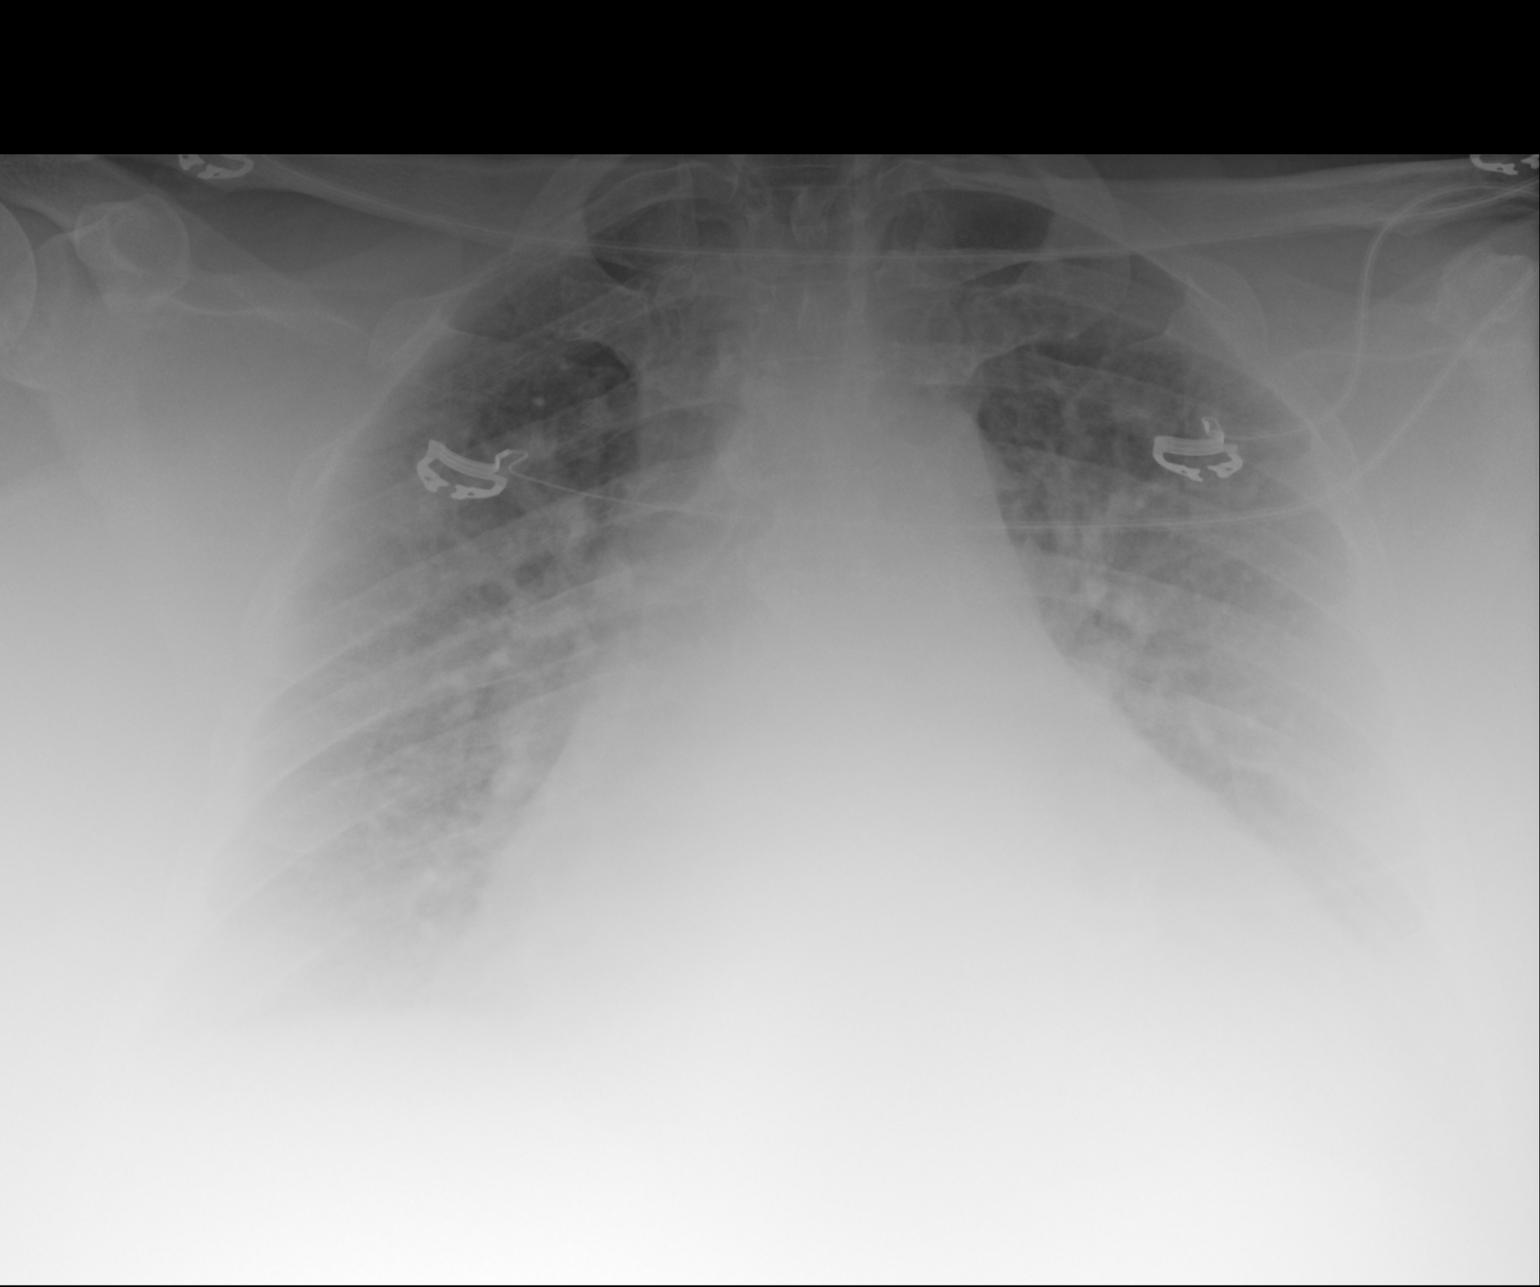

[1 of 1 positions shown; findings below may reference images not displayed]

FINDINGS: Underpenetrated AP portable examination. Gross cardiomegaly similar
prior examination. Pulmonary vascular prominence without overt edema
or obvious pleural effusions. No focal airspace opacity.
IMPRESSION: Underpenetrated AP portable examination. Gross cardiomegaly similar
prior examination. Pulmonary vascular prominence without overt edema
or obvious pleural effusions. No focal airspace opacity.

## 2020-06-08 IMAGING — CR PORTABLE CHEST - 1 VIEW
1 series · 1 of 1 positions shown · non-contrast
Comparison: 09/18/2018 chest radiograph.

CLINICAL DATA: Dyspnea, lower extremity edema

EXAM:
PORTABLE CHEST 1 VIEW

[portable]
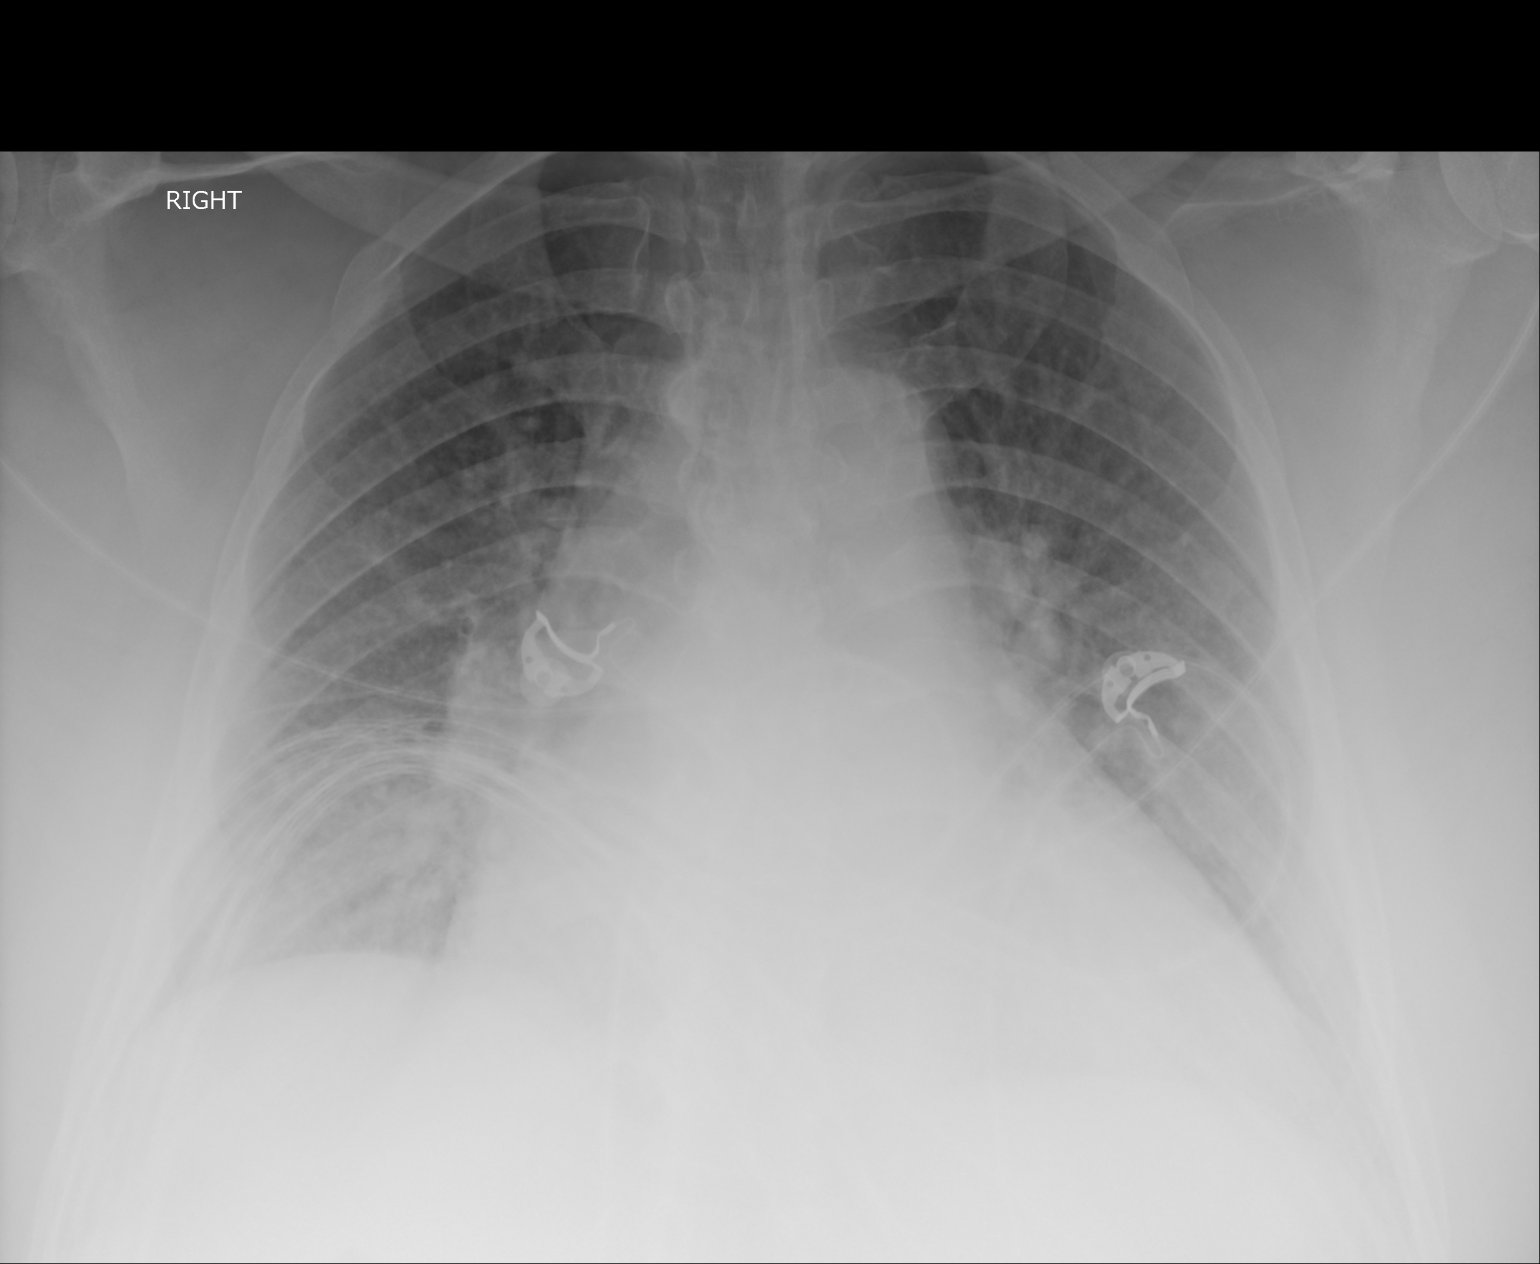

[1 of 1 positions shown; findings below may reference images not displayed]

FINDINGS: Stable cardiomediastinal silhouette with moderate cardiomegaly. No
pneumothorax. No pleural effusion. Mild pulmonary edema.
IMPRESSION: Mild congestive heart failure.

## 2020-06-30 IMAGING — DX CHEST - 2 VIEW
2 series · 2 of 2 positions shown · non-contrast
Comparison: Radiograph October 02, 2018.

CLINICAL DATA: Shortness of breath.

EXAM:
CHEST - 2 VIEW

[chest pa]
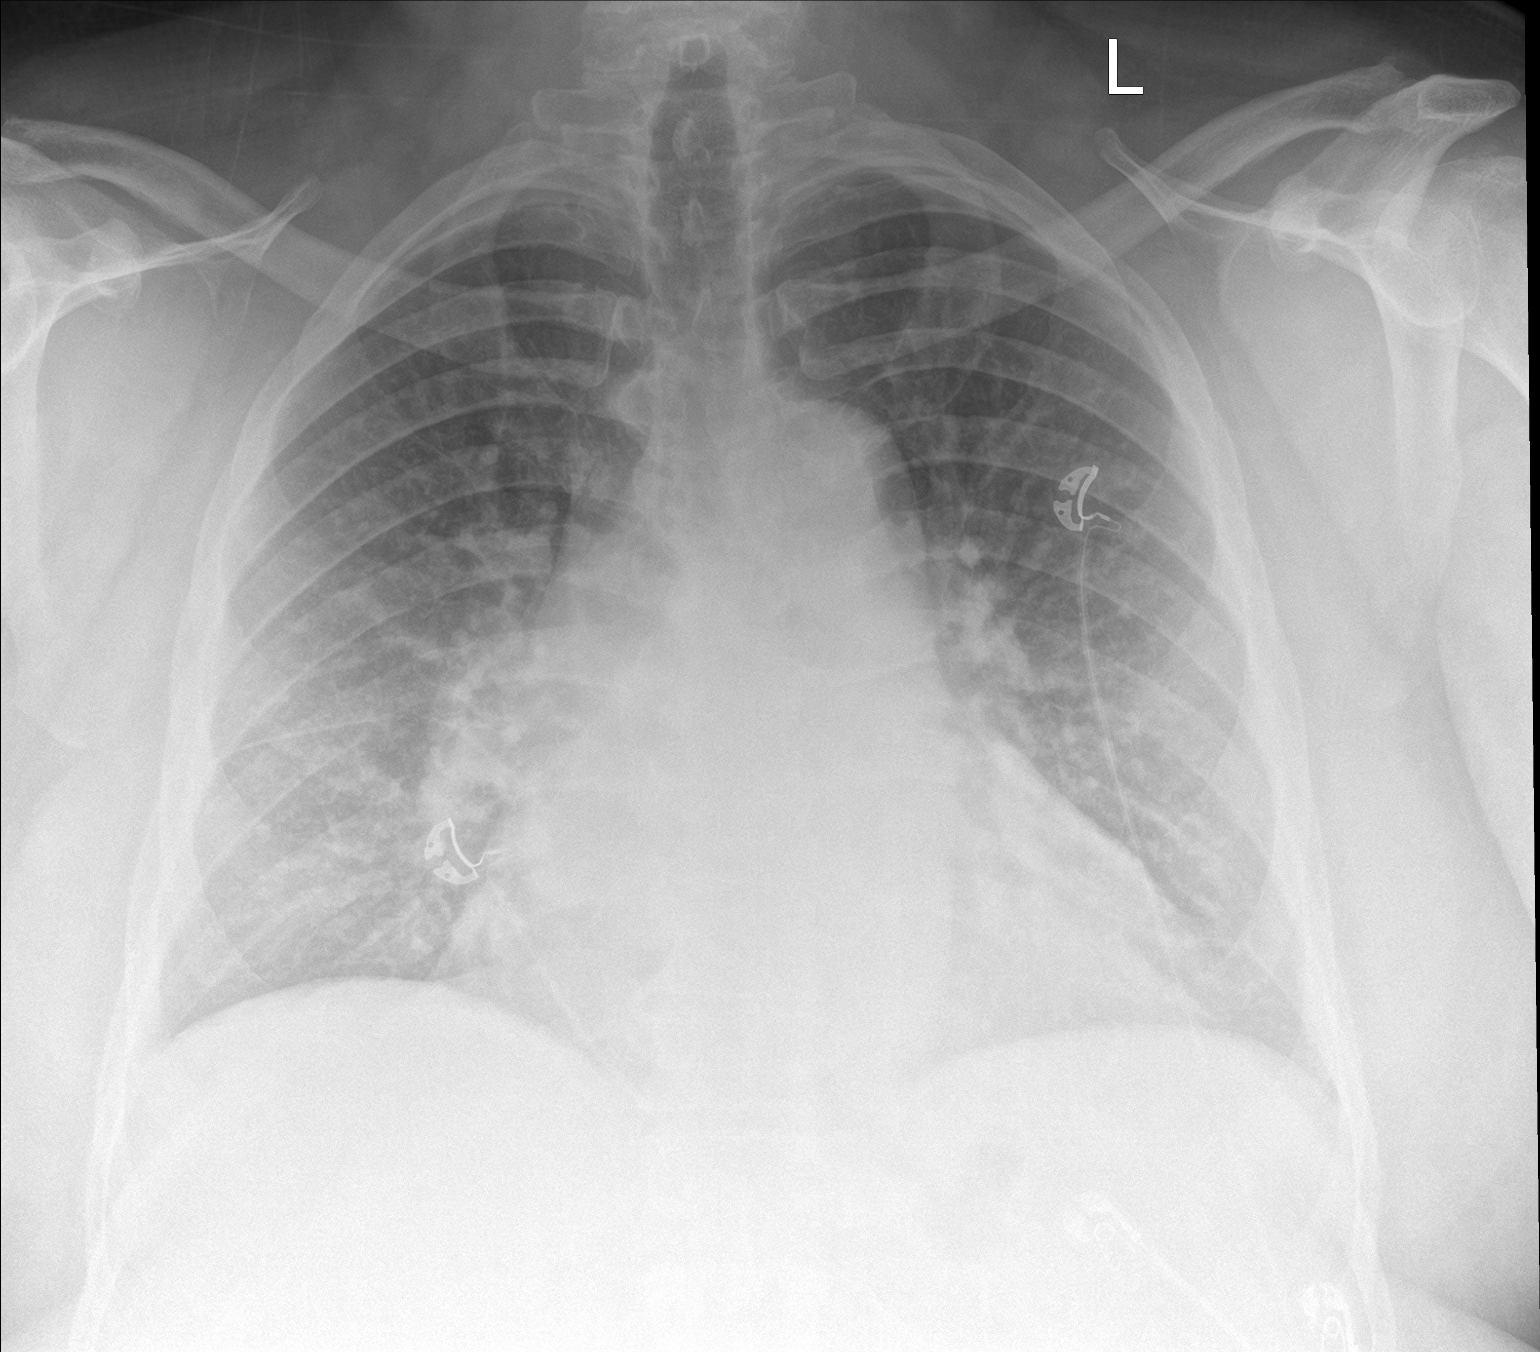

[chest lat]
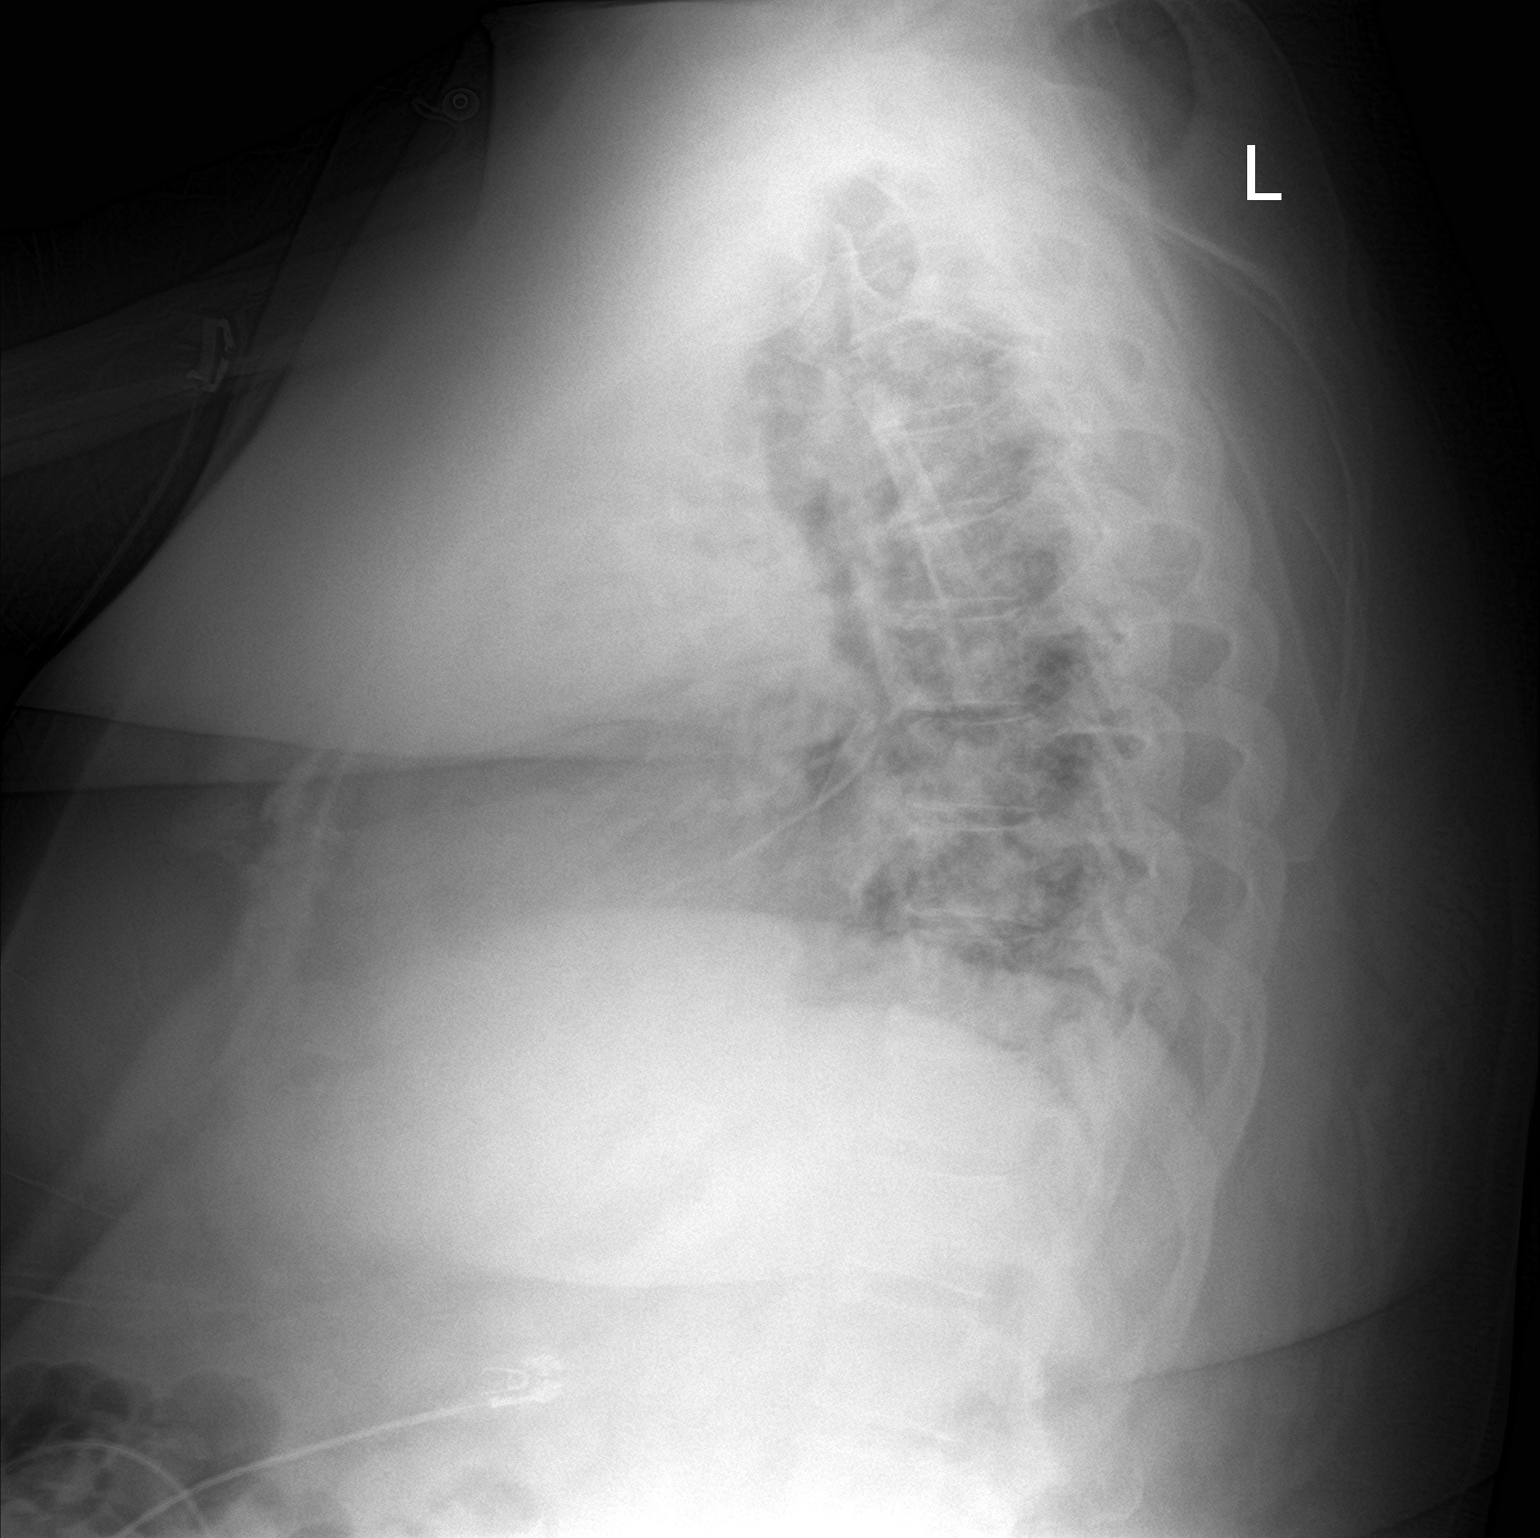

[2 of 2 positions shown; findings below may reference images not displayed]

FINDINGS: Stable cardiomegaly with central pulmonary vascular congestion. No
pneumothorax or pleural effusion is noted. No acute pulmonary
disease is noted. Bony thorax is unremarkable.
IMPRESSION: Stable cardiomegaly with central pulmonary vascular congestion

## 2020-07-06 IMAGING — CR PORTABLE CHEST - 1 VIEW
1 series · 1 of 1 positions shown · non-contrast
Comparison: 10/24/2018

CLINICAL DATA: Short of breath.

EXAM:
PORTABLE CHEST 1 VIEW

[portable]
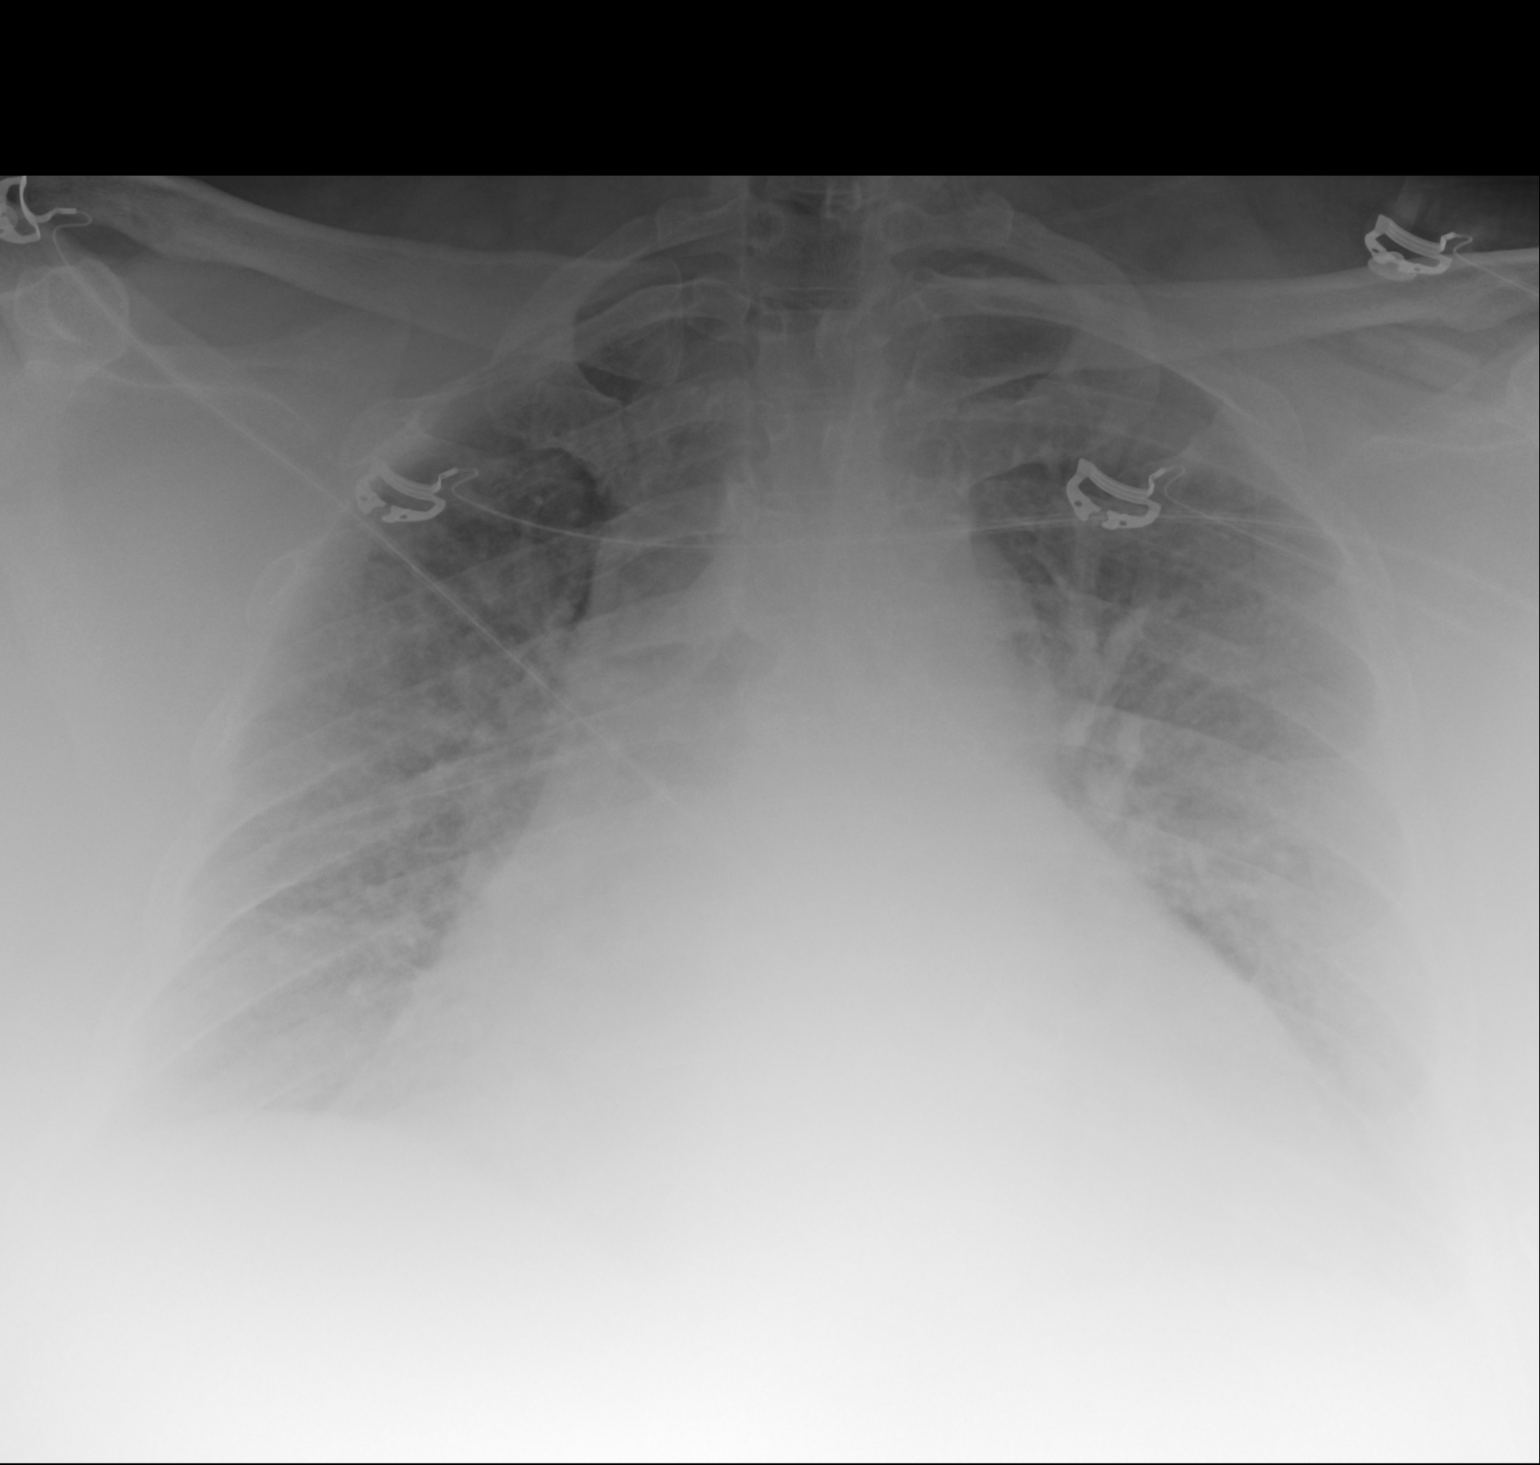

[1 of 1 positions shown; findings below may reference images not displayed]

FINDINGS: Stable enlarged cardiac silhouette. Mild central venous congestion.
No overt pulmonary edema. No pneumothorax.
IMPRESSION: Cardiomegaly and central venous congestion.

## 2020-07-16 IMAGING — DX CHEST - 2 VIEW
2 series · 2 of 2 positions shown · non-contrast
Comparison: 10/30/2018

CLINICAL DATA: Shortness of breath for 2 days

EXAM:
CHEST - 2 VIEW

[chest pa]
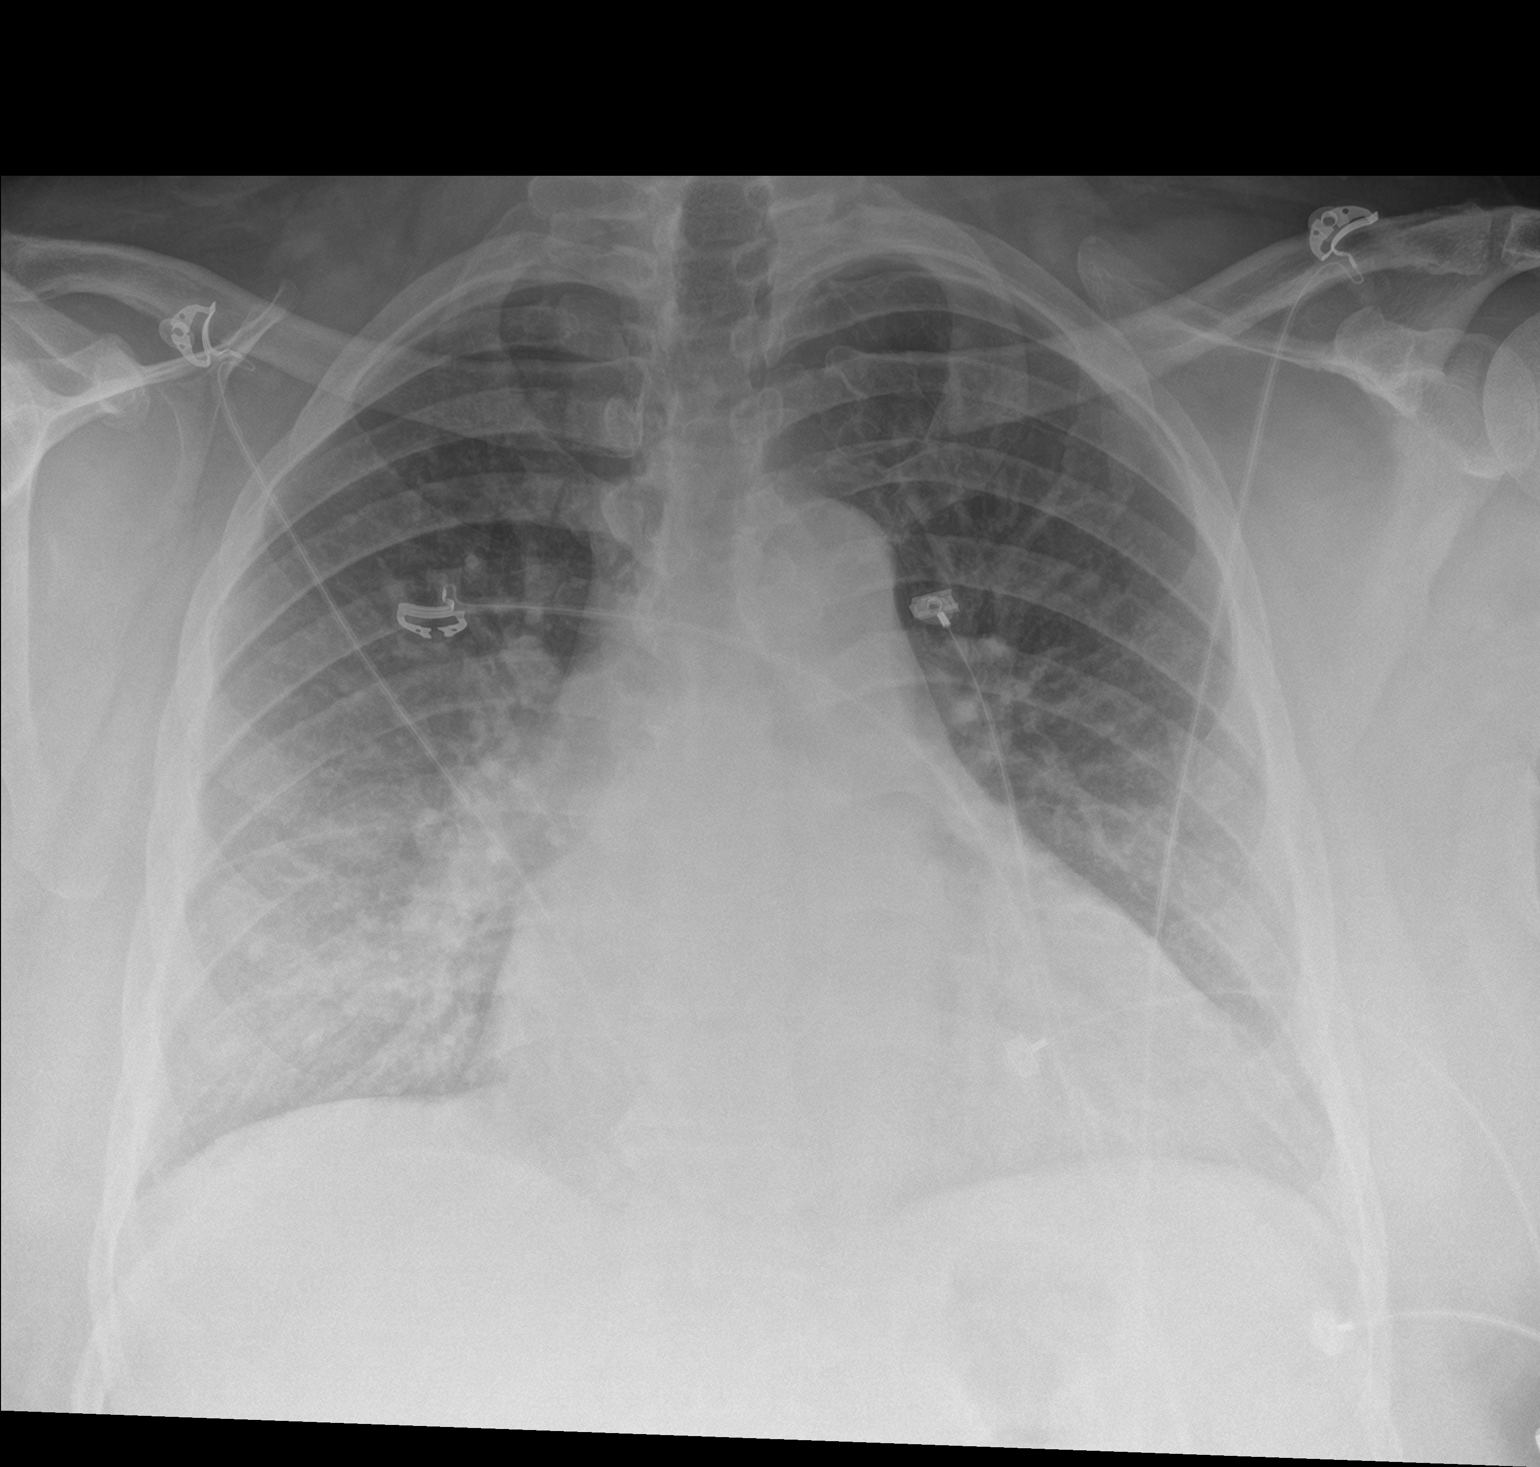

[chest lat]
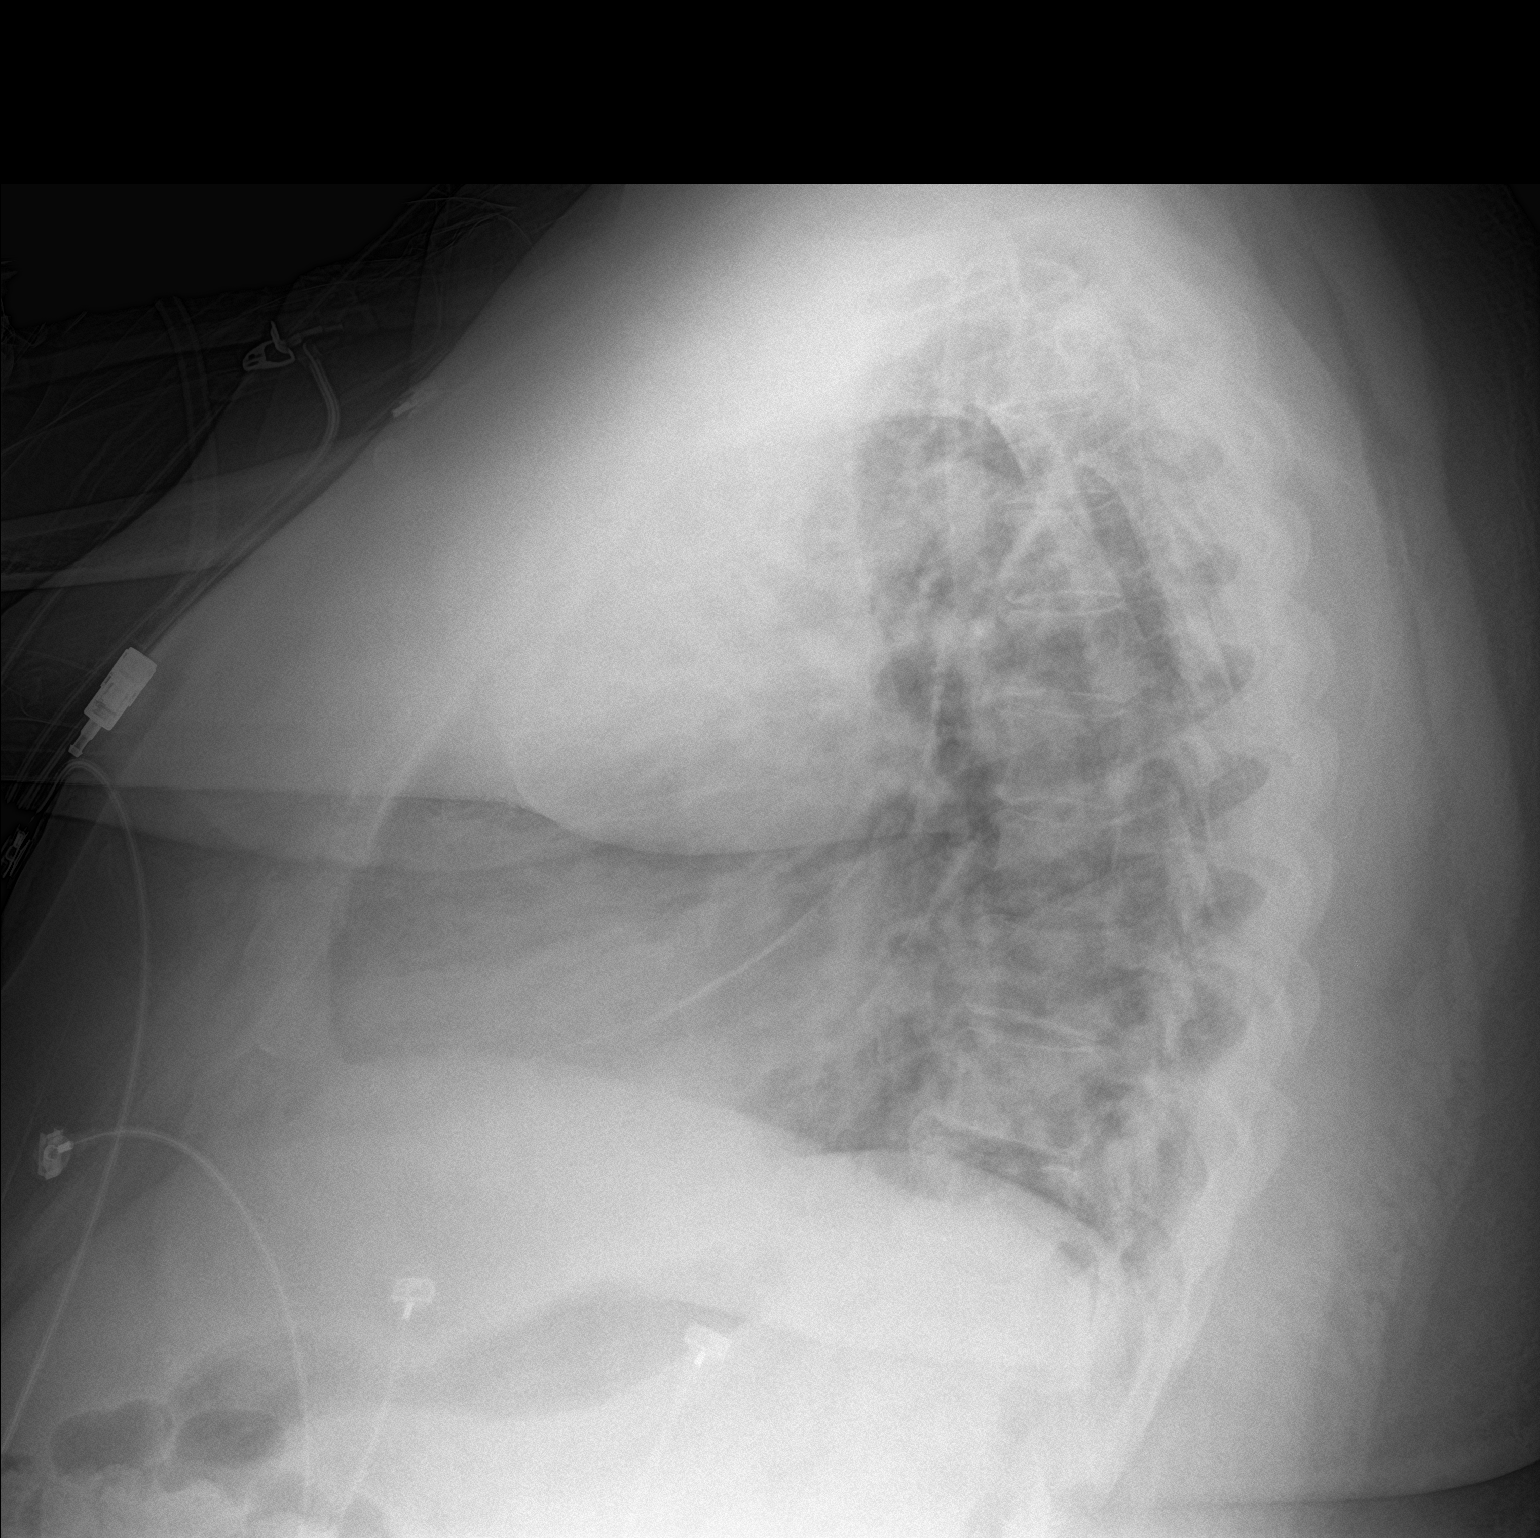

[2 of 2 positions shown; findings below may reference images not displayed]

FINDINGS: Cardiac shadow is enlarged. Vascular congestion is noted as well as
patchy infiltrate over the mid and lower lung on the right
consistent with lower lobe infiltrate. No sizable effusion is noted.
No bony abnormality is noted.
IMPRESSION: Right lower lobe infiltrate.

Mild vascular congestion remains.

## 2020-07-31 IMAGING — CR PORTABLE CHEST - 1 VIEW
1 series · 1 of 1 positions shown · non-contrast
Comparison: 11/09/2018

CLINICAL DATA: Increasing shortness of breath

EXAM:
PORTABLE CHEST 1 VIEW

[portable]
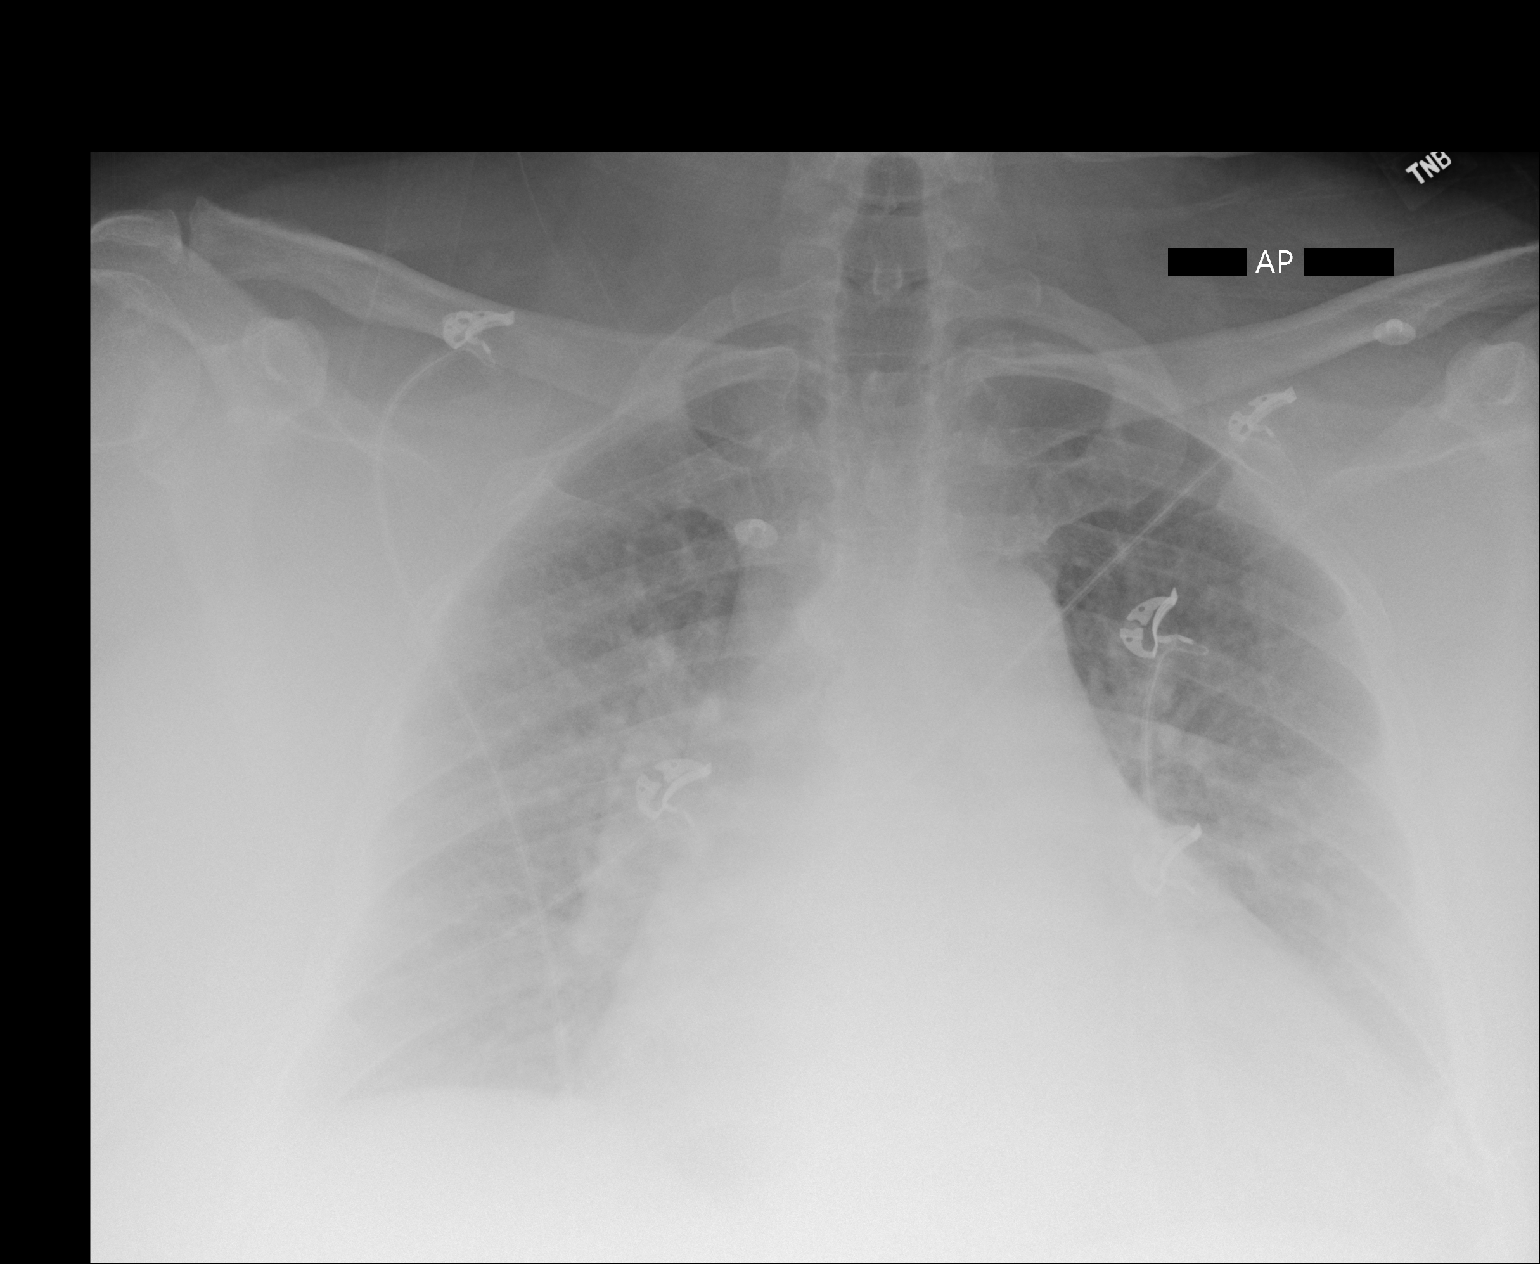

[1 of 1 positions shown; findings below may reference images not displayed]

FINDINGS: Cardiac shadow remains enlarged but stable. The lungs are well
aerated bilaterally. Vascular congestion is again seen. No focal
infiltrate is noted. No bony abnormality is seen.
IMPRESSION: Mild vascular congestion.  No focal infiltrate is noted

## 2020-08-14 IMAGING — CR PORTABLE CHEST - 1 VIEW
1 series · 2 of 2 positions shown · non-contrast
Comparison: Multiple prior exams most recently 3 days prior
12/05/2018.

CLINICAL DATA: Shortness of breath.

EXAM:
PORTABLE CHEST 1 VIEW

[Series 3: ap · 0.17mm/px · 2 of 2 slices shown]
[im 1/2]
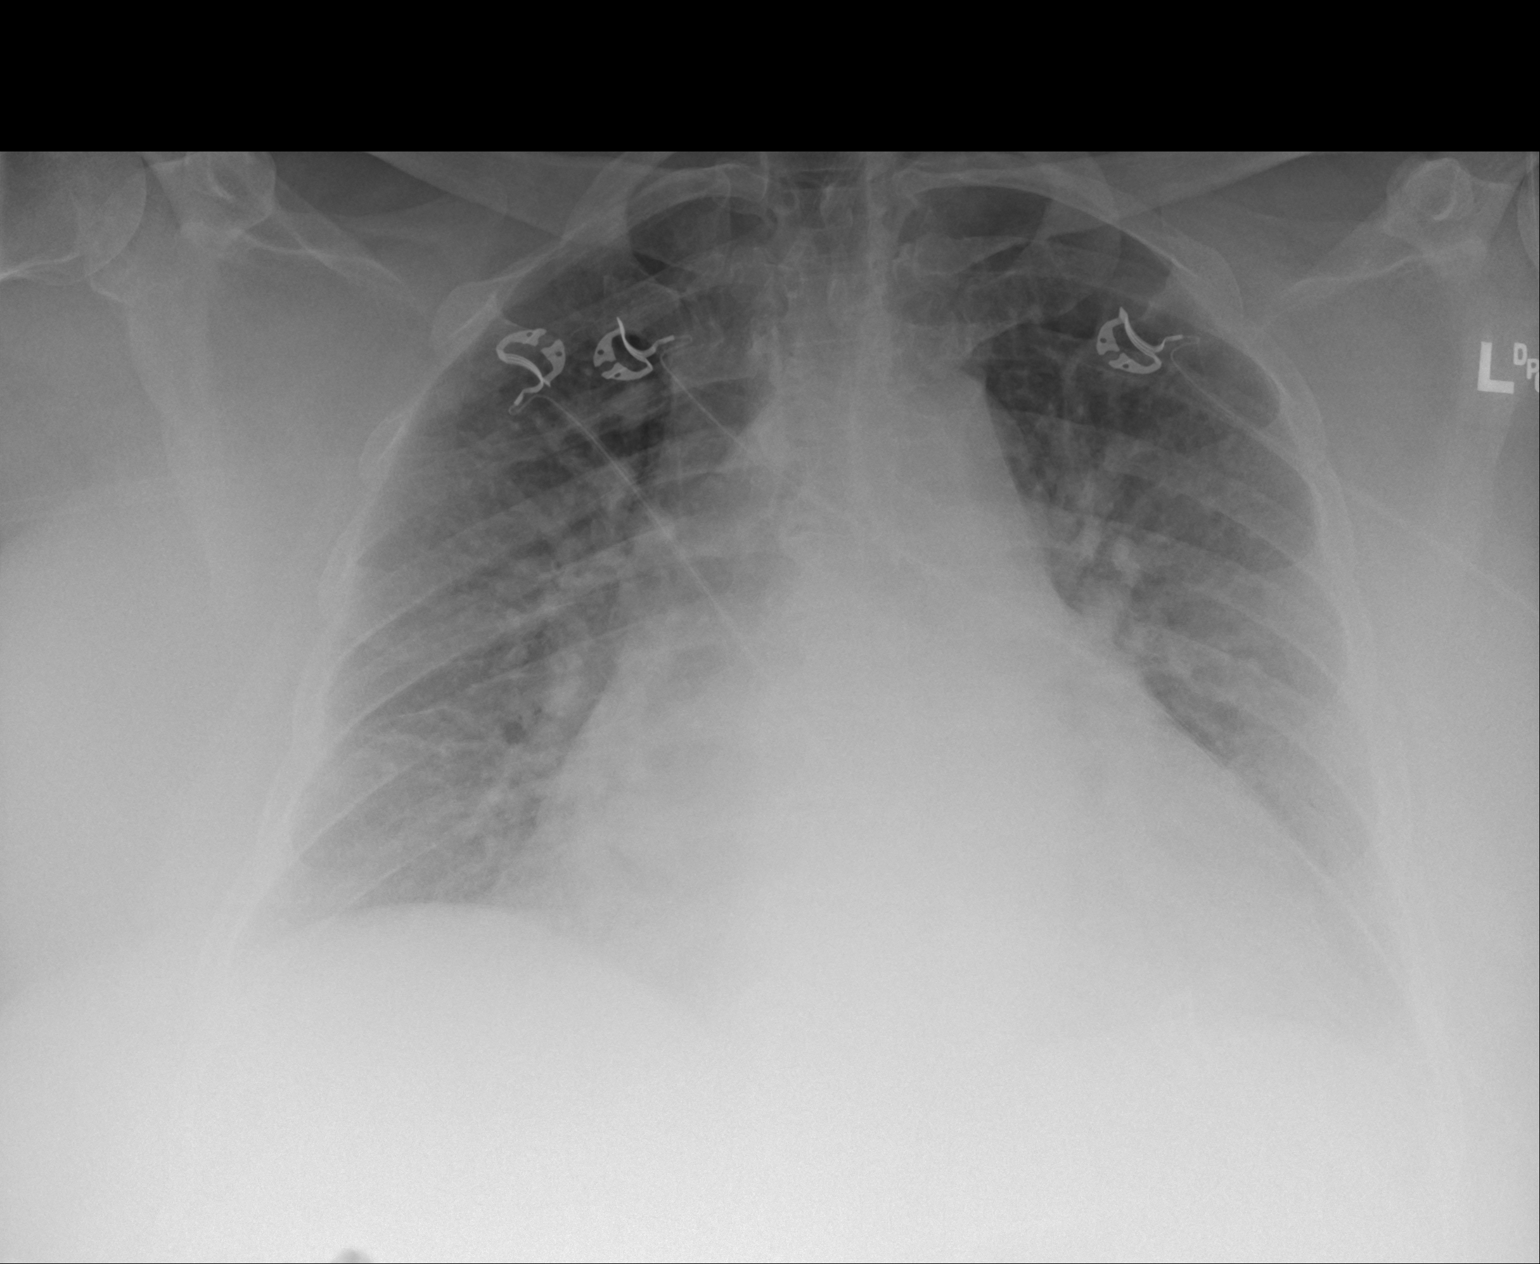
[im 2/2]
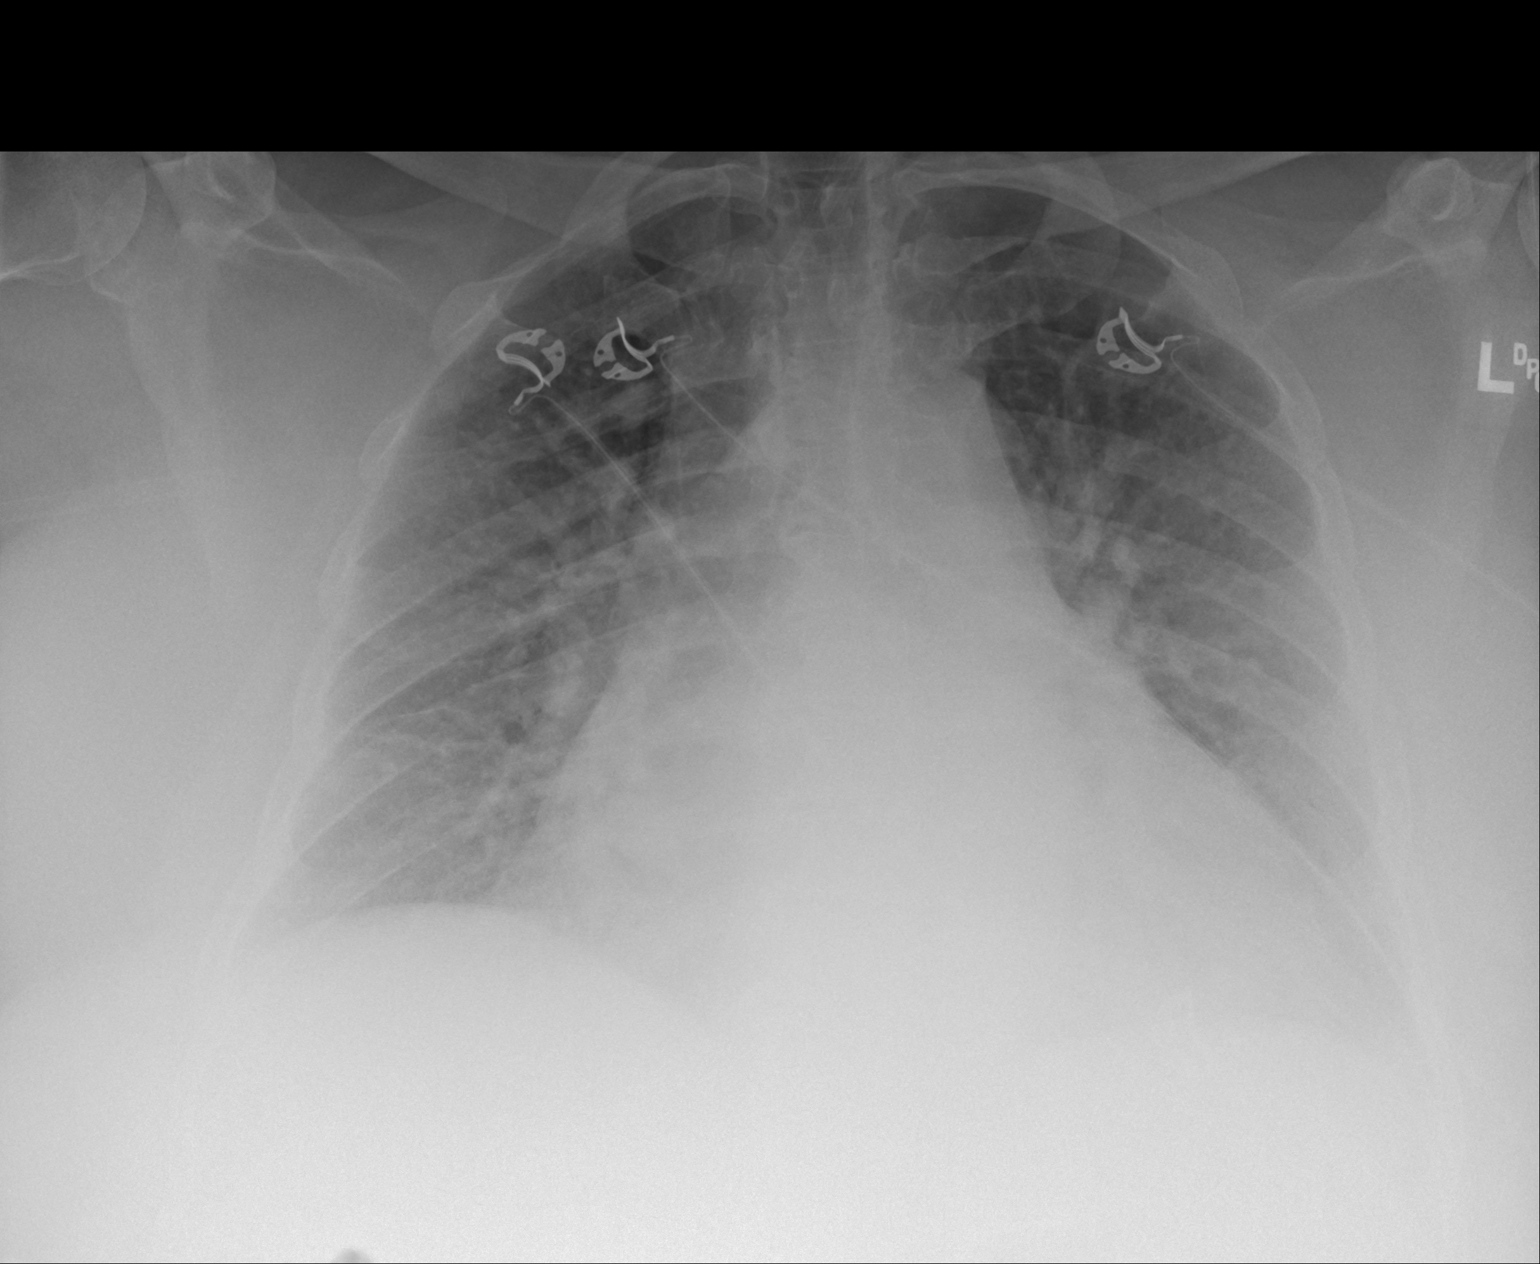

[2 of 2 positions shown; findings below may reference images not displayed]

FINDINGS: Cardiomegaly is unchanged. Unchanged mediastinal contours. Vascular
congestion. No focal airspace disease. No large pleural effusion. No
pneumothorax. Soft tissue attenuation from habitus limits
assessment.
IMPRESSION: Unchanged cardiomegaly with vascular congestion.

## 2020-08-14 IMAGING — CT CT OF THE RIGHT TIBIA AND FIBULA WITH CONTRAST
2 series · 10 of 27 positions shown, 13 images · IV contrast (agent unspecified)
Comparison: Plain films 12/05/2018

CONTRAST:  100mL OMNIPAQUE IOHEXOL 300 MG/ML  SOLN

CLINICAL DATA: Right lower leg pain, generalized edema

EXAM:
CT OF THE LOWER RIGHT EXTREMITY WITH CONTRAST
TECHNIQUE: Multidetector CT imaging of the lower right extremity was performed
according to the standard protocol following intravenous contrast
administration.

[Series 5: axial st · axial · 0.60mm/px · z∈[-1220,-810]mm · 5 of 297 slices shown, 7 images]
[im 46/297  soft-tissue]
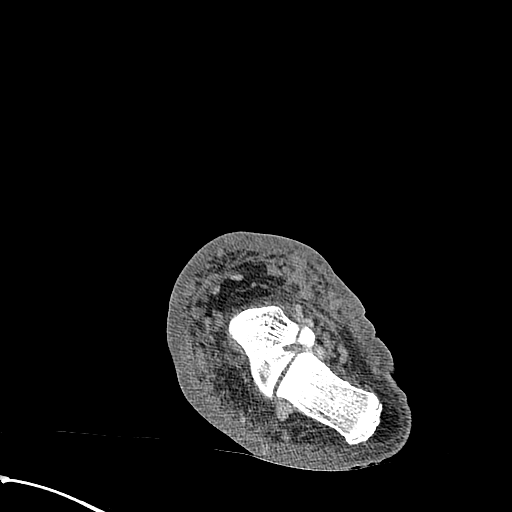
[im 46/297  bone]
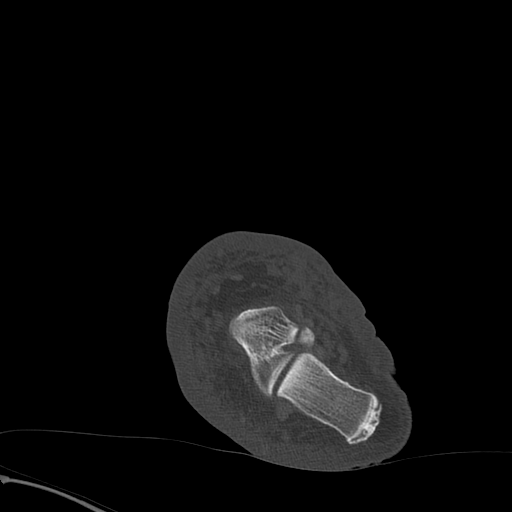
[im 92/297  bone]
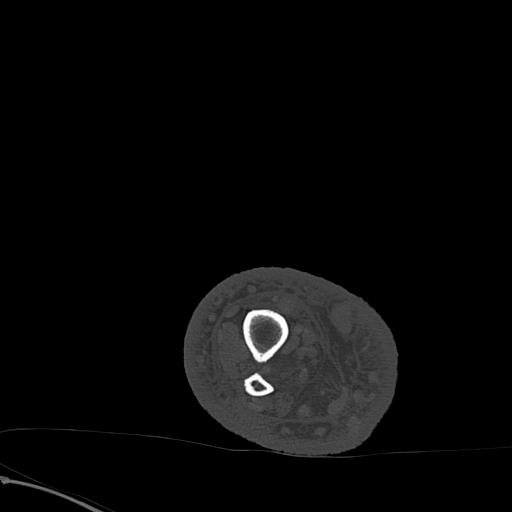
[im 160/297  bone]
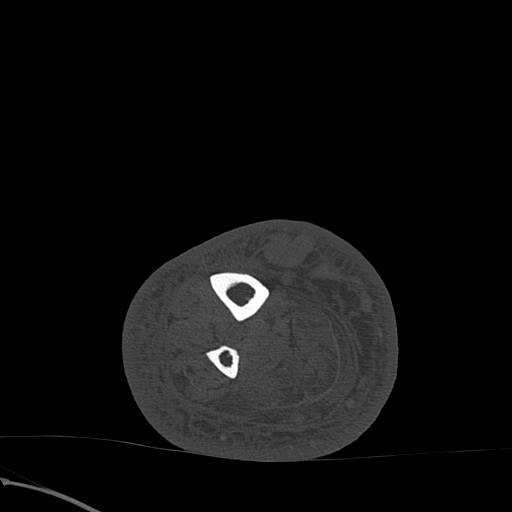
[im 205/297  bone]
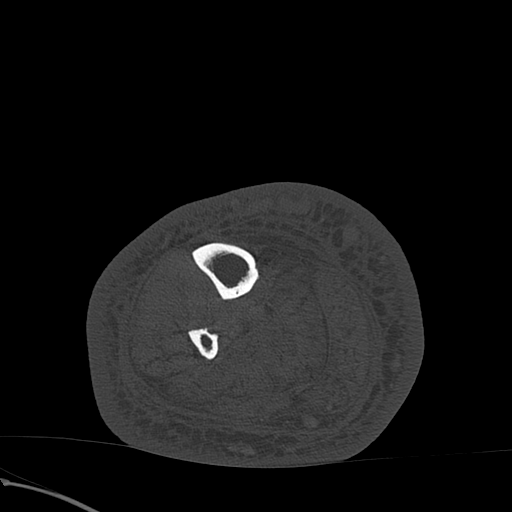
[im 251/297  soft-tissue]
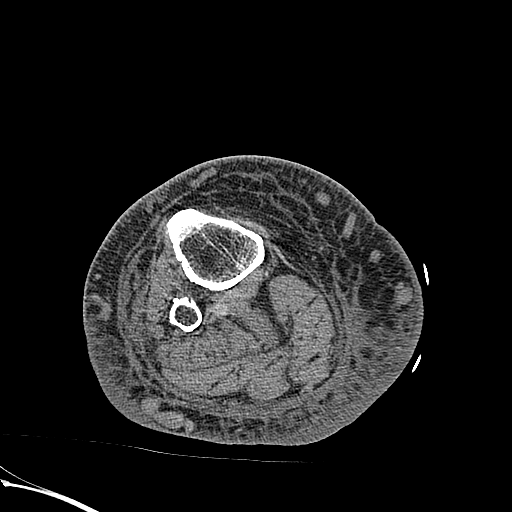
[im 251/297  bone]
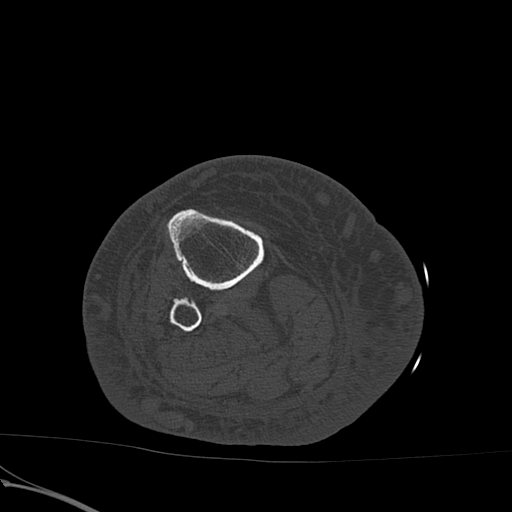

[Series 9: sag st · sagittal · 0.53mm/px · 5 of 101 slices shown, 6 images]
[im 34/101  bone]
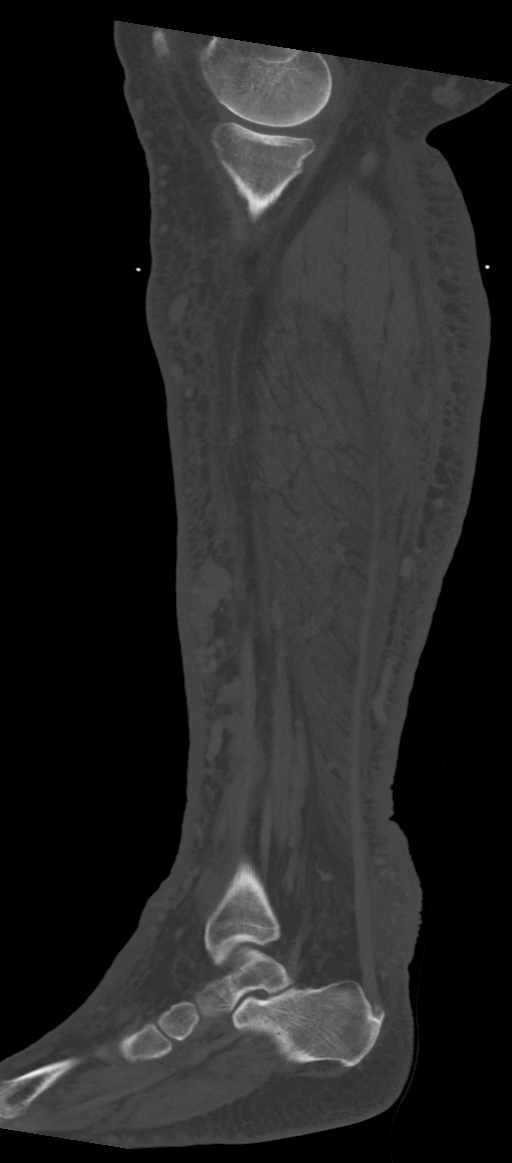
[im 42/101  bone]
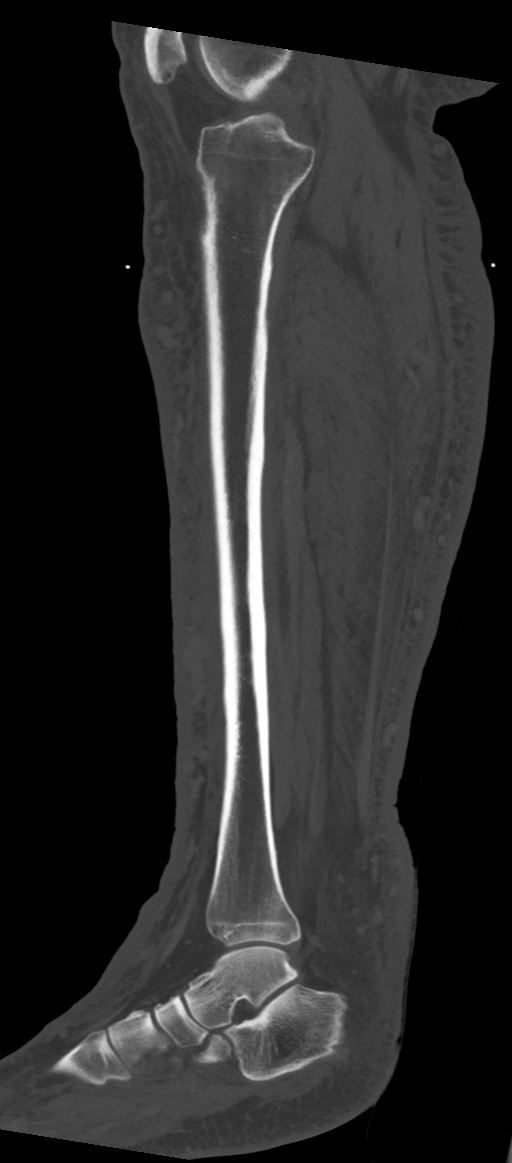
[im 51/101  soft-tissue]
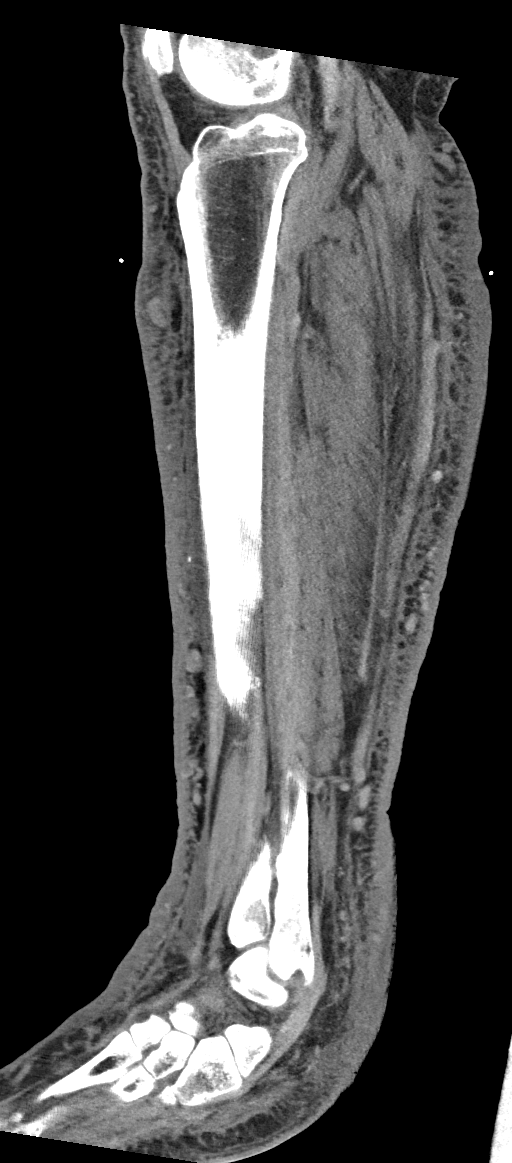
[im 51/101  bone]
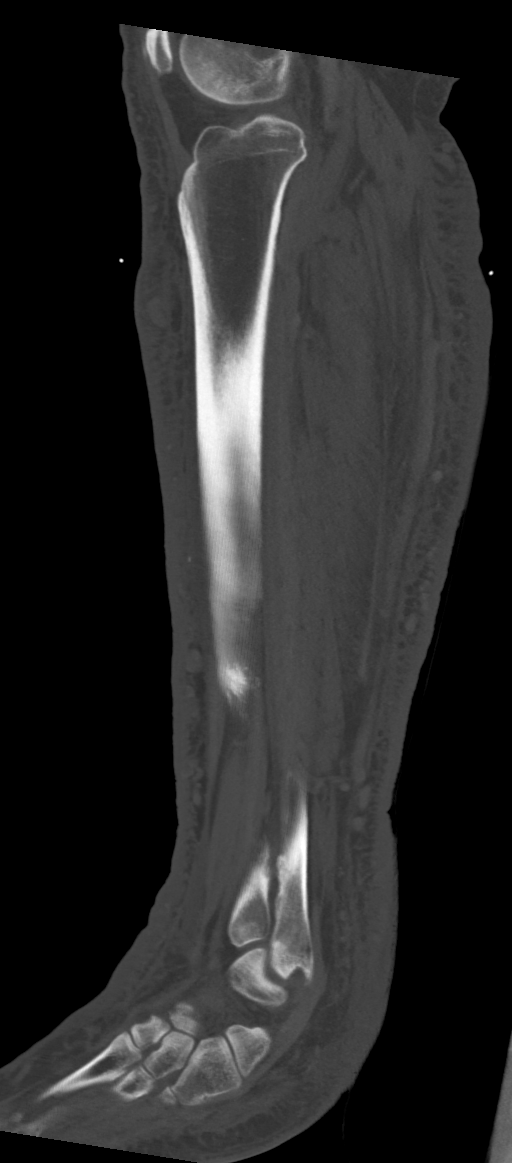
[im 59/101  bone]
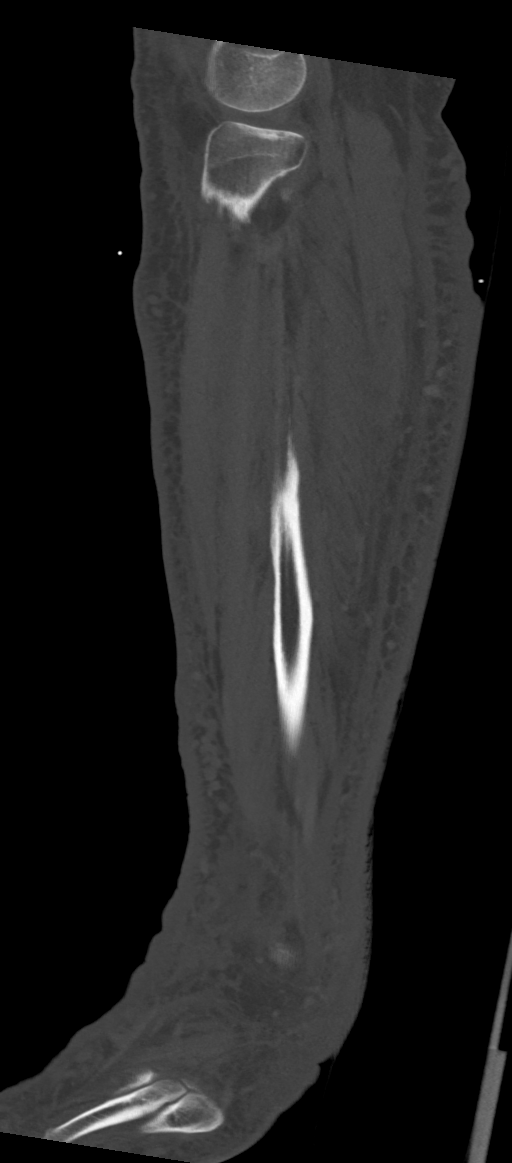
[im 67/101  bone]
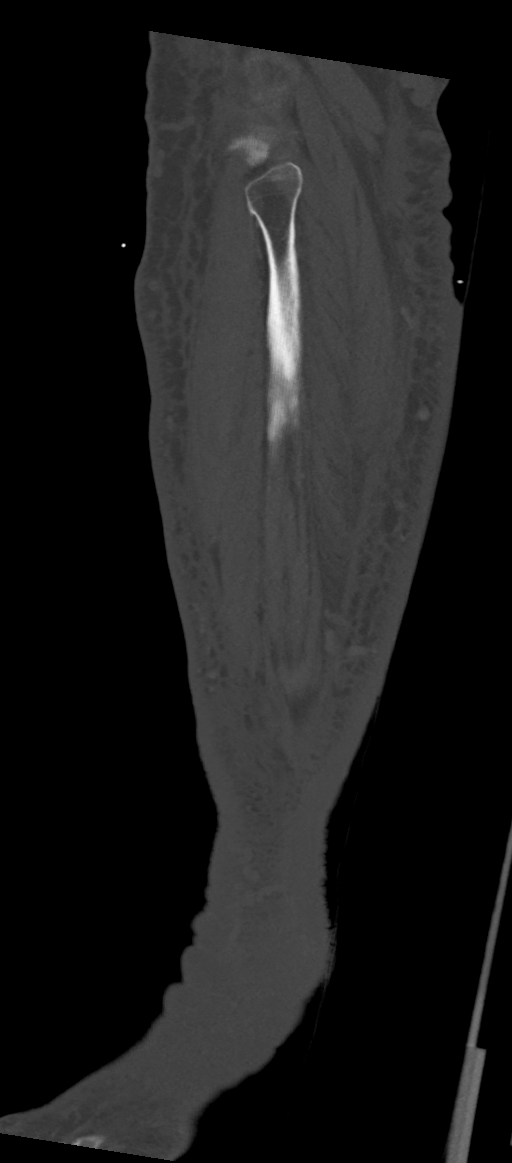

[10 of 27 positions shown; findings below may reference images not displayed]

FINDINGS: Diffuse generalized edema throughout the right lower extremity.
Dilated varicose veins noted throughout the right lower extremity.
No focal fluid collection. No acute bony abnormality. No bone
destruction to suggest osteomyelitis. No fracture, subluxation or
dislocation.
IMPRESSION: No acute bony abnormality.

Diffuse generalized edema throughout the right lower extremity.
Prominent varicose veins most noted medially within the right calf.

## 2020-08-20 IMAGING — CR PORTABLE CHEST - 1 VIEW
1 series · 4 of 4 positions shown · non-contrast
Comparison: December 08, 2018

CLINICAL DATA: Shortness of breath

EXAM:
PORTABLE CHEST 1 VIEW

[Series 1: portable · 0.17mm/px · 4 of 4 slices shown]
[im 1/4]
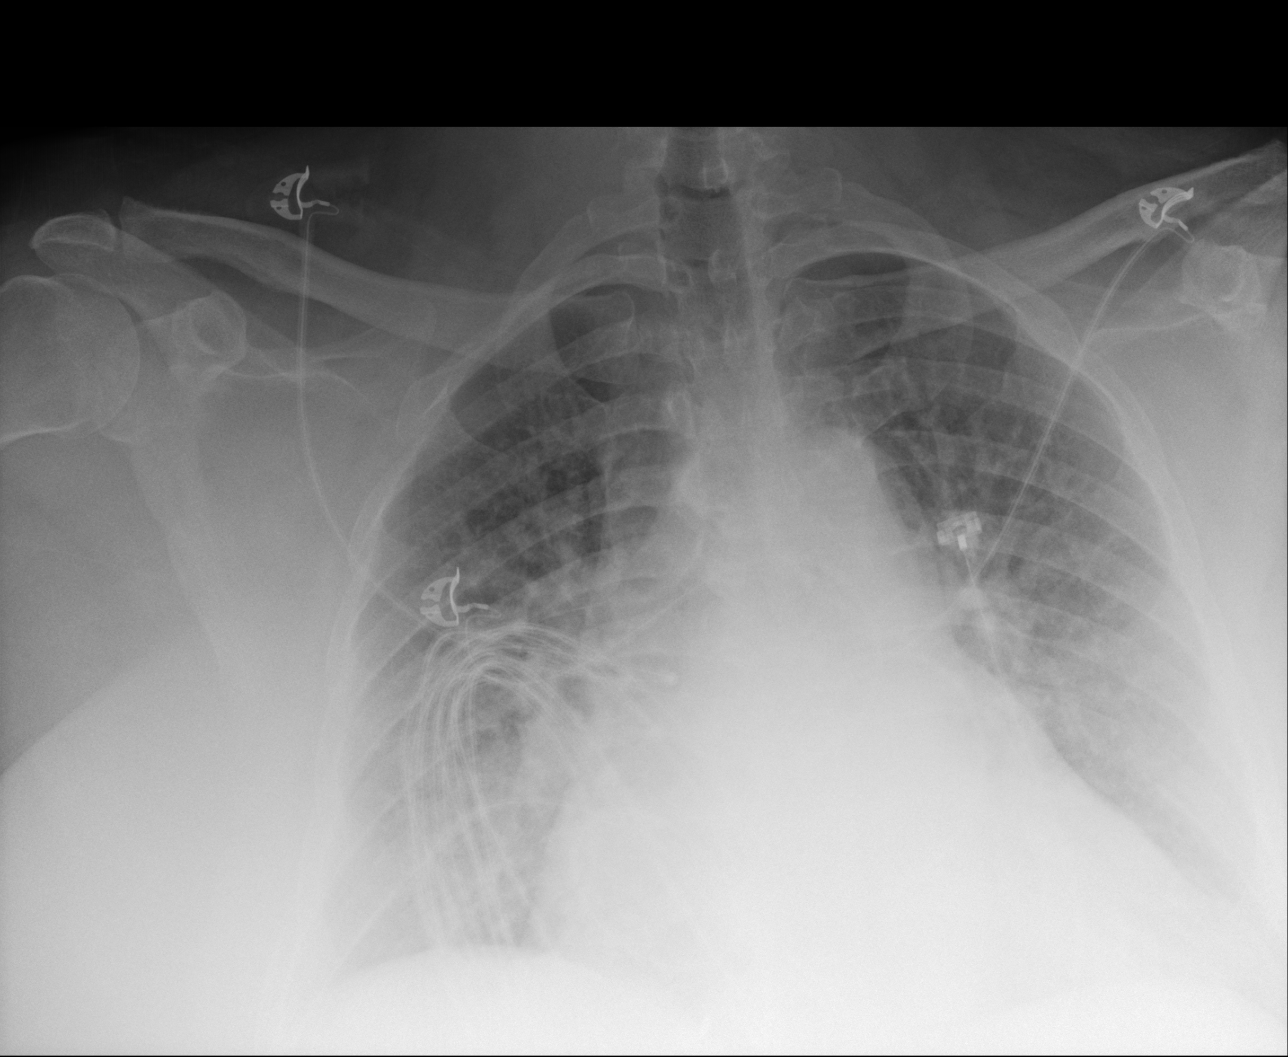
[im 2/4]
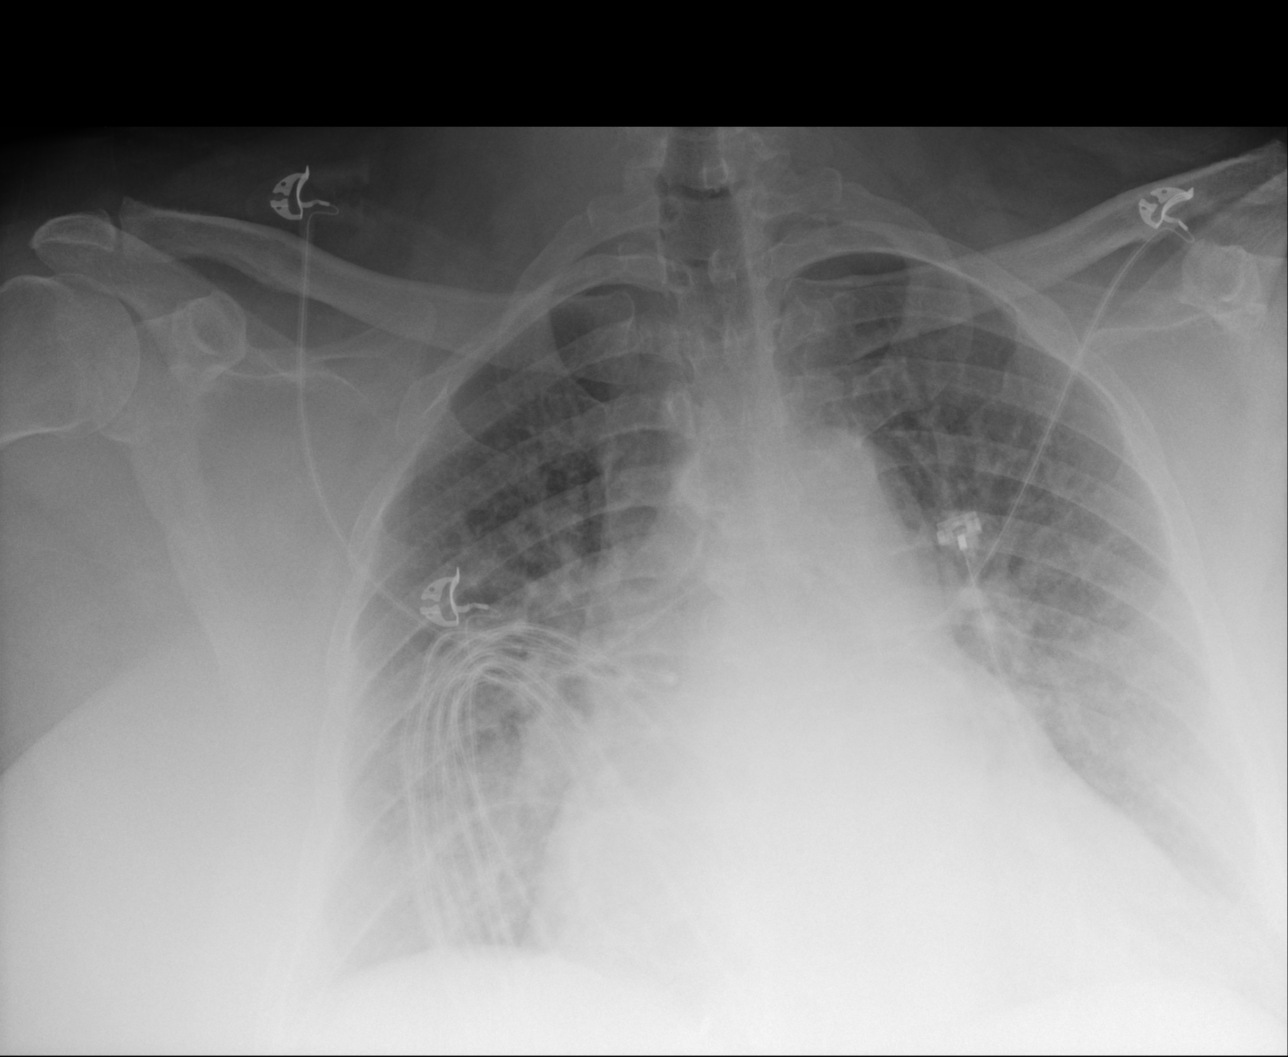
[im 3/4]
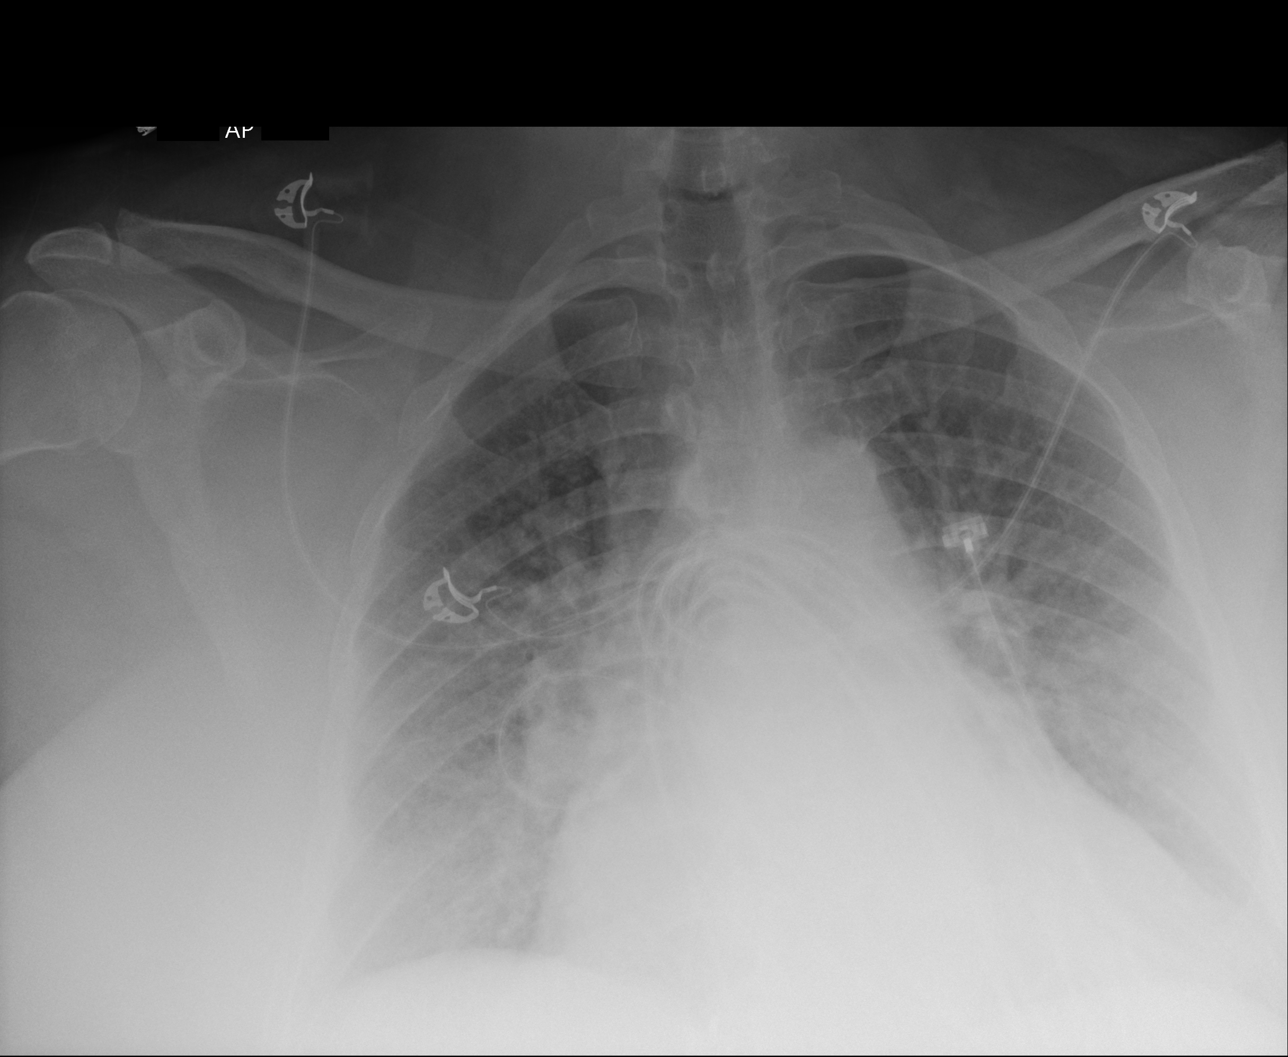
[im 4/4]
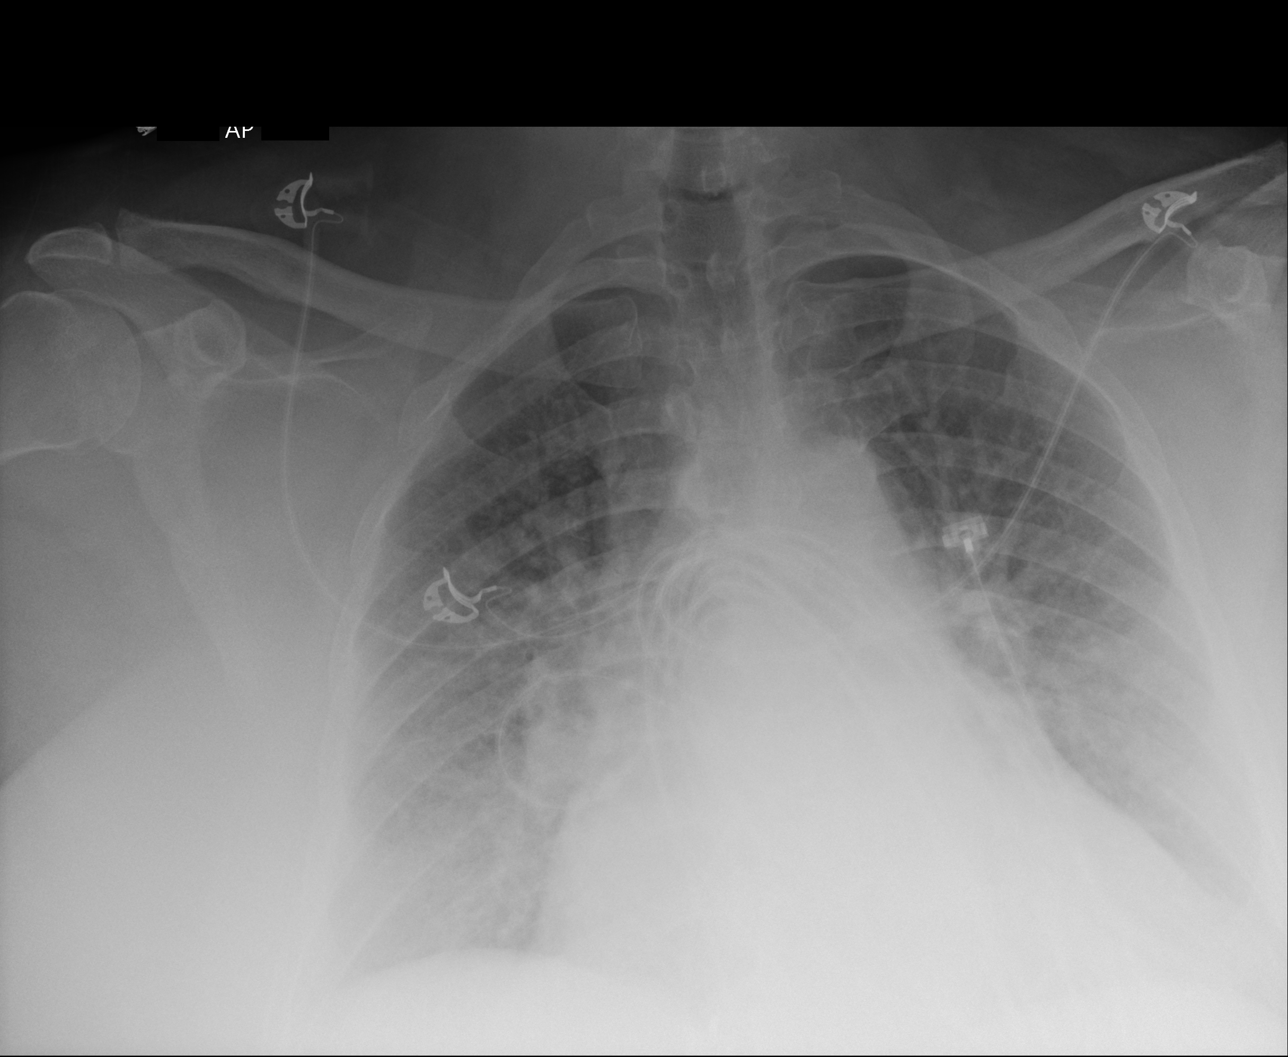

[4 of 4 positions shown; findings below may reference images not displayed]

FINDINGS: The heart is significantly enlarged. There is evidence of vascular
congestion and pulmonary edema. No pneumothorax. There may be small
bilateral pleural effusions. Evaluation is limited by patient body
habitus.
IMPRESSION: Cardiomegaly with persistent vascular congestion.

## 2020-09-05 IMAGING — CT CT HEAD WITHOUT CONTRAST
3 series · 16 of 47 positions shown, 19 images · non-contrast
Comparison: 05/11/2017

CLINICAL DATA: Altered level consciousness

EXAM:
CT HEAD WITHOUT CONTRAST
TECHNIQUE: Contiguous axial images were obtained from the base of the skull
through the vertex without intravenous contrast.

[Series 2: head w o · axial · 0.44mm/px · z∈[+441,+566]mm · 10 of 31 slices shown, 13 images]
[im 3/31  brain]
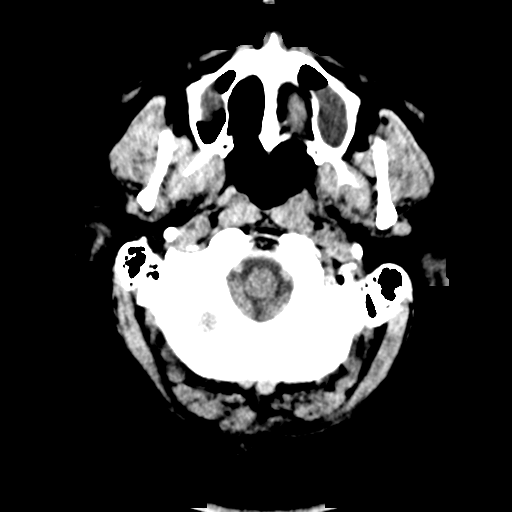
[im 3/31  bone]
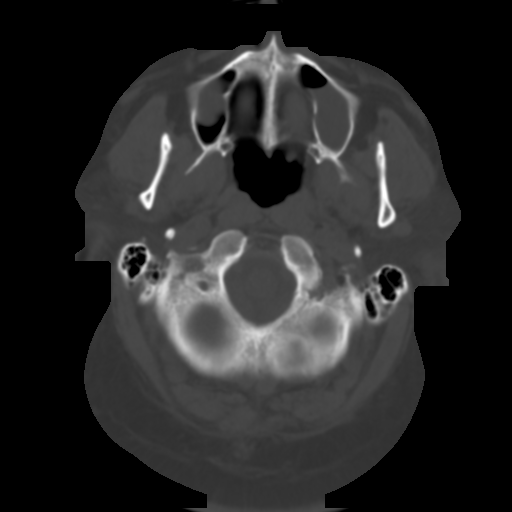
[im 6/31  brain]
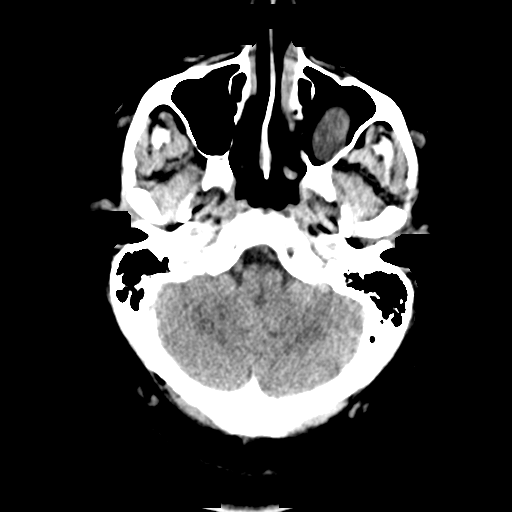
[im 9/31  brain]
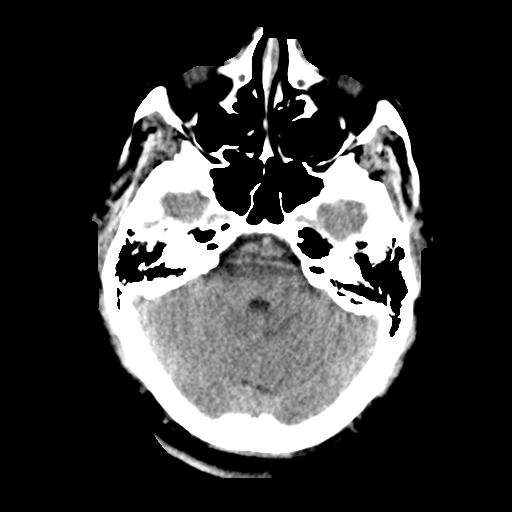
[im 11/31  brain]
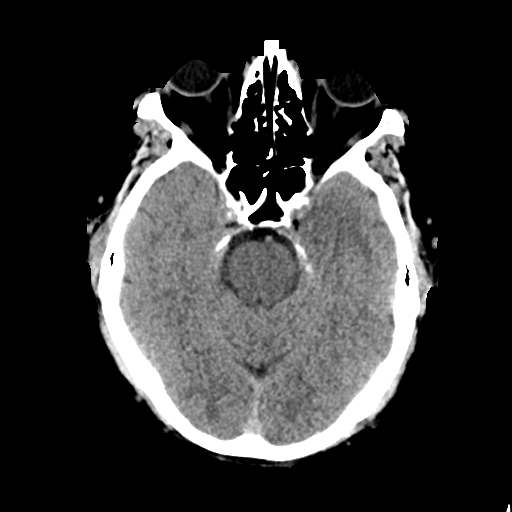
[im 14/31  brain]
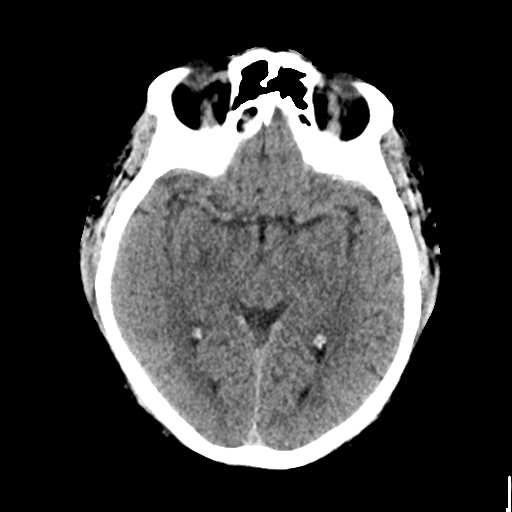
[im 14/31  bone]
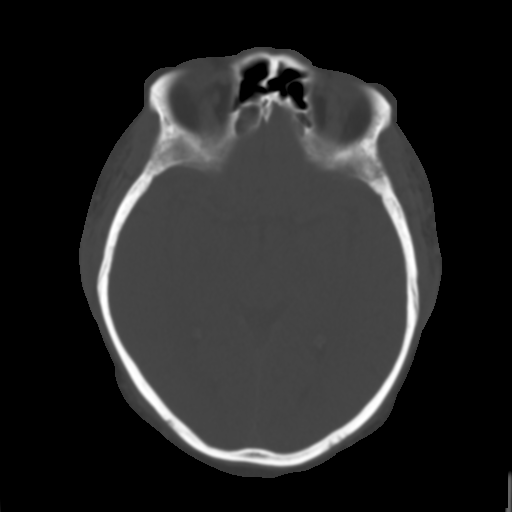
[im 17/31  brain]
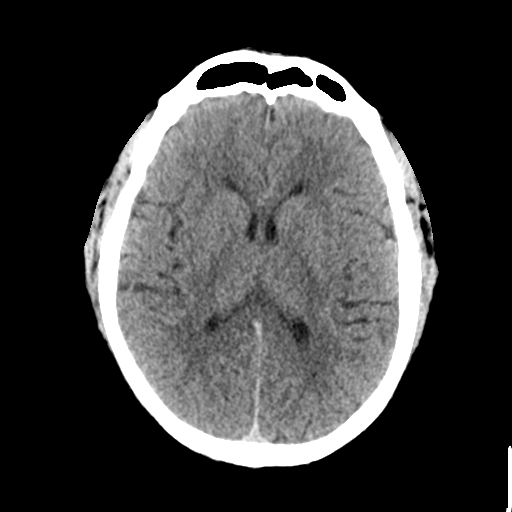
[im 20/31  brain]
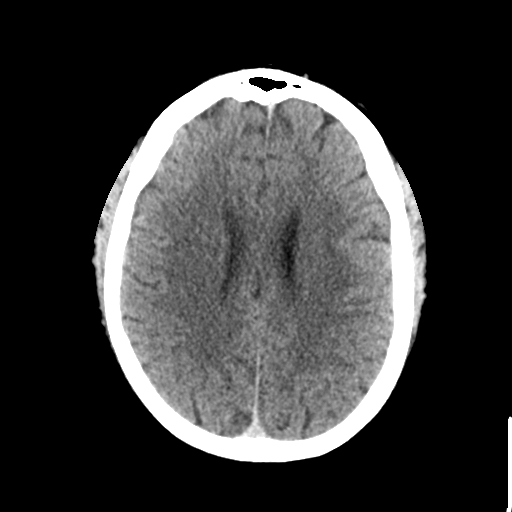
[im 23/31  brain]
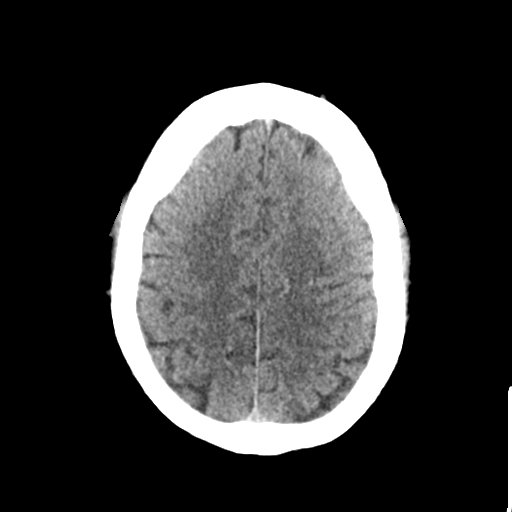
[im 25/31  brain]
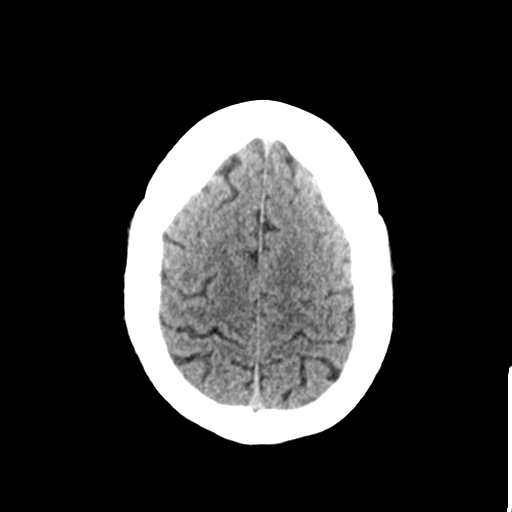
[im 25/31  bone]
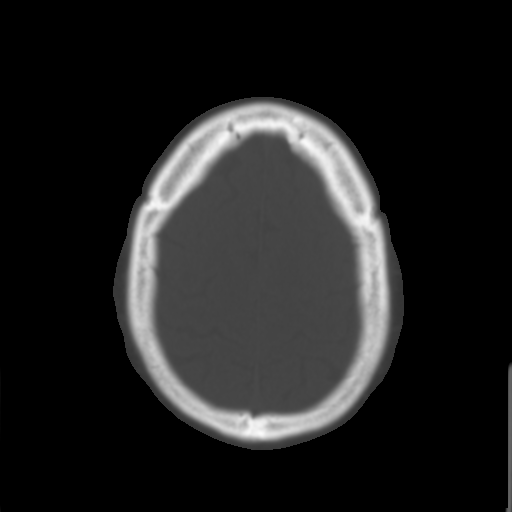
[im 28/31  brain]
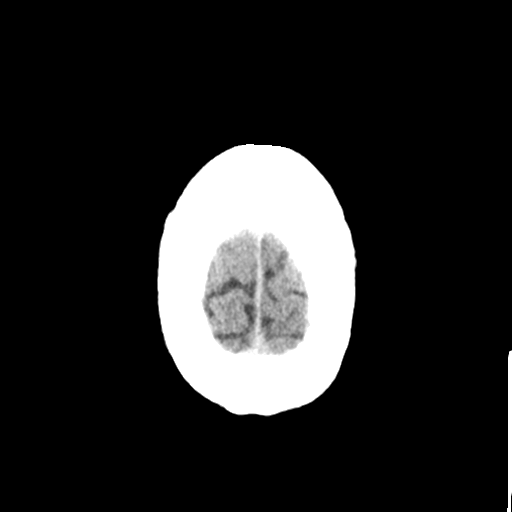

[Series 4: coronal soft · coronal · 0.36mm/px · 3 of 75 slices shown]
[im 25/75  brain]
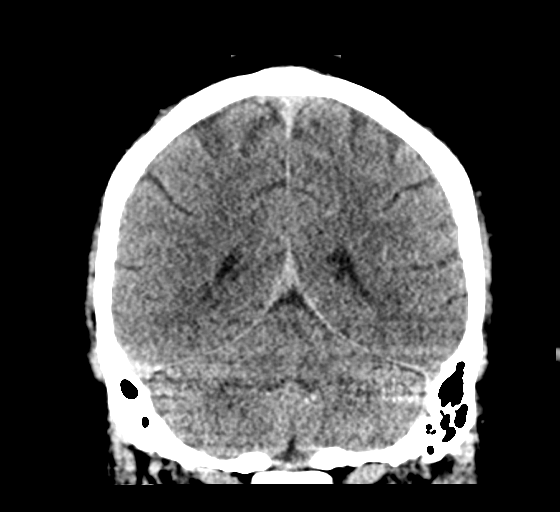
[im 33/75  brain]
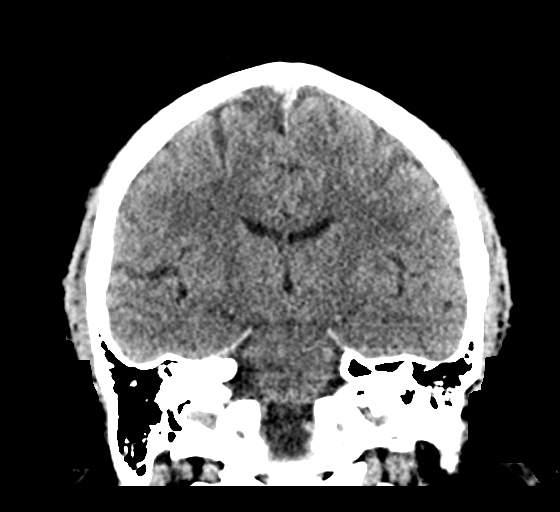
[im 42/75  brain]
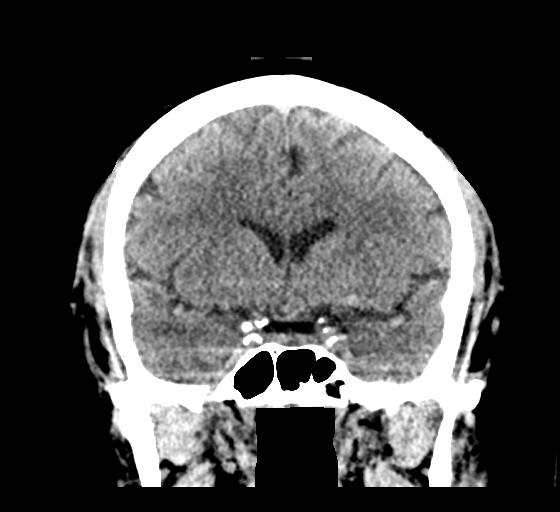

[Series 5: sagittal soft · sagittal · 0.35mm/px · 3 of 67 slices shown]
[im 23/67  brain]
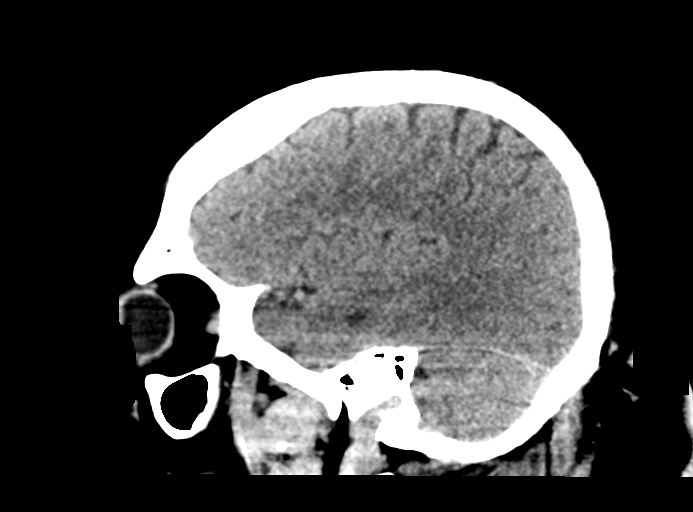
[im 34/67  brain]
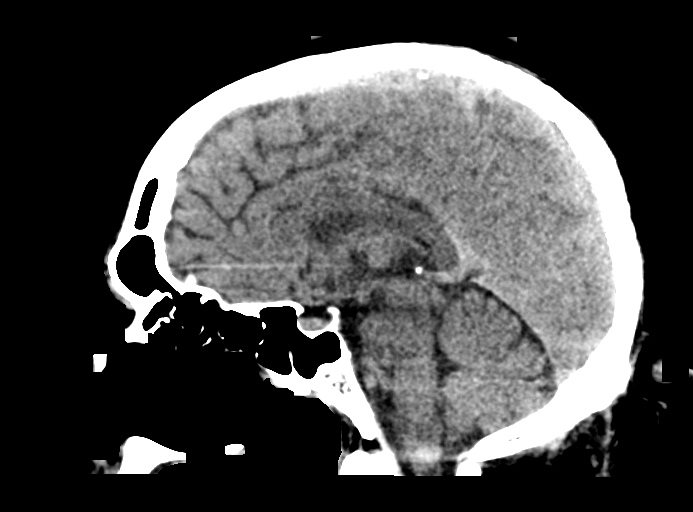
[im 45/67  brain]
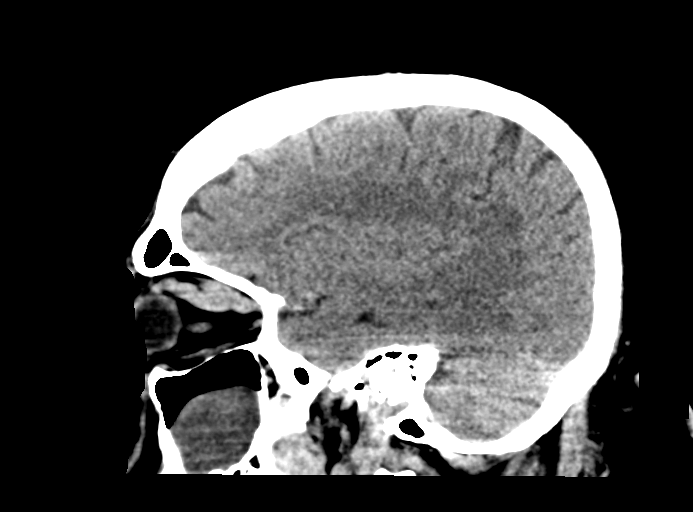

[16 of 47 positions shown; findings below may reference images not displayed]

FINDINGS: Brain: No evidence of acute infarction, hemorrhage, hydrocephalus,
extra-axial collection or mass lesion/mass effect.

Vascular: No hyperdense vessel or unexpected calcification.

Skull: Normal. Negative for fracture or focal lesion.

Sinuses/Orbits: Mucosal retention cyst is noted in the maxillary
antra bilaterally. Orbits and their contents are within normal
limits.

Other: None
IMPRESSION: No acute abnormality noted.

## 2020-09-08 IMAGING — CR PORTABLE CHEST - 1 VIEW
1 series · 2 of 2 positions shown · non-contrast
Comparison: 12/14/2018

CLINICAL DATA: Shortness of breath and leg swelling

EXAM:
PORTABLE CHEST 1 VIEW

[Series 1: portable · 0.17mm/px · 2 of 2 slices shown]
[im 1/2]
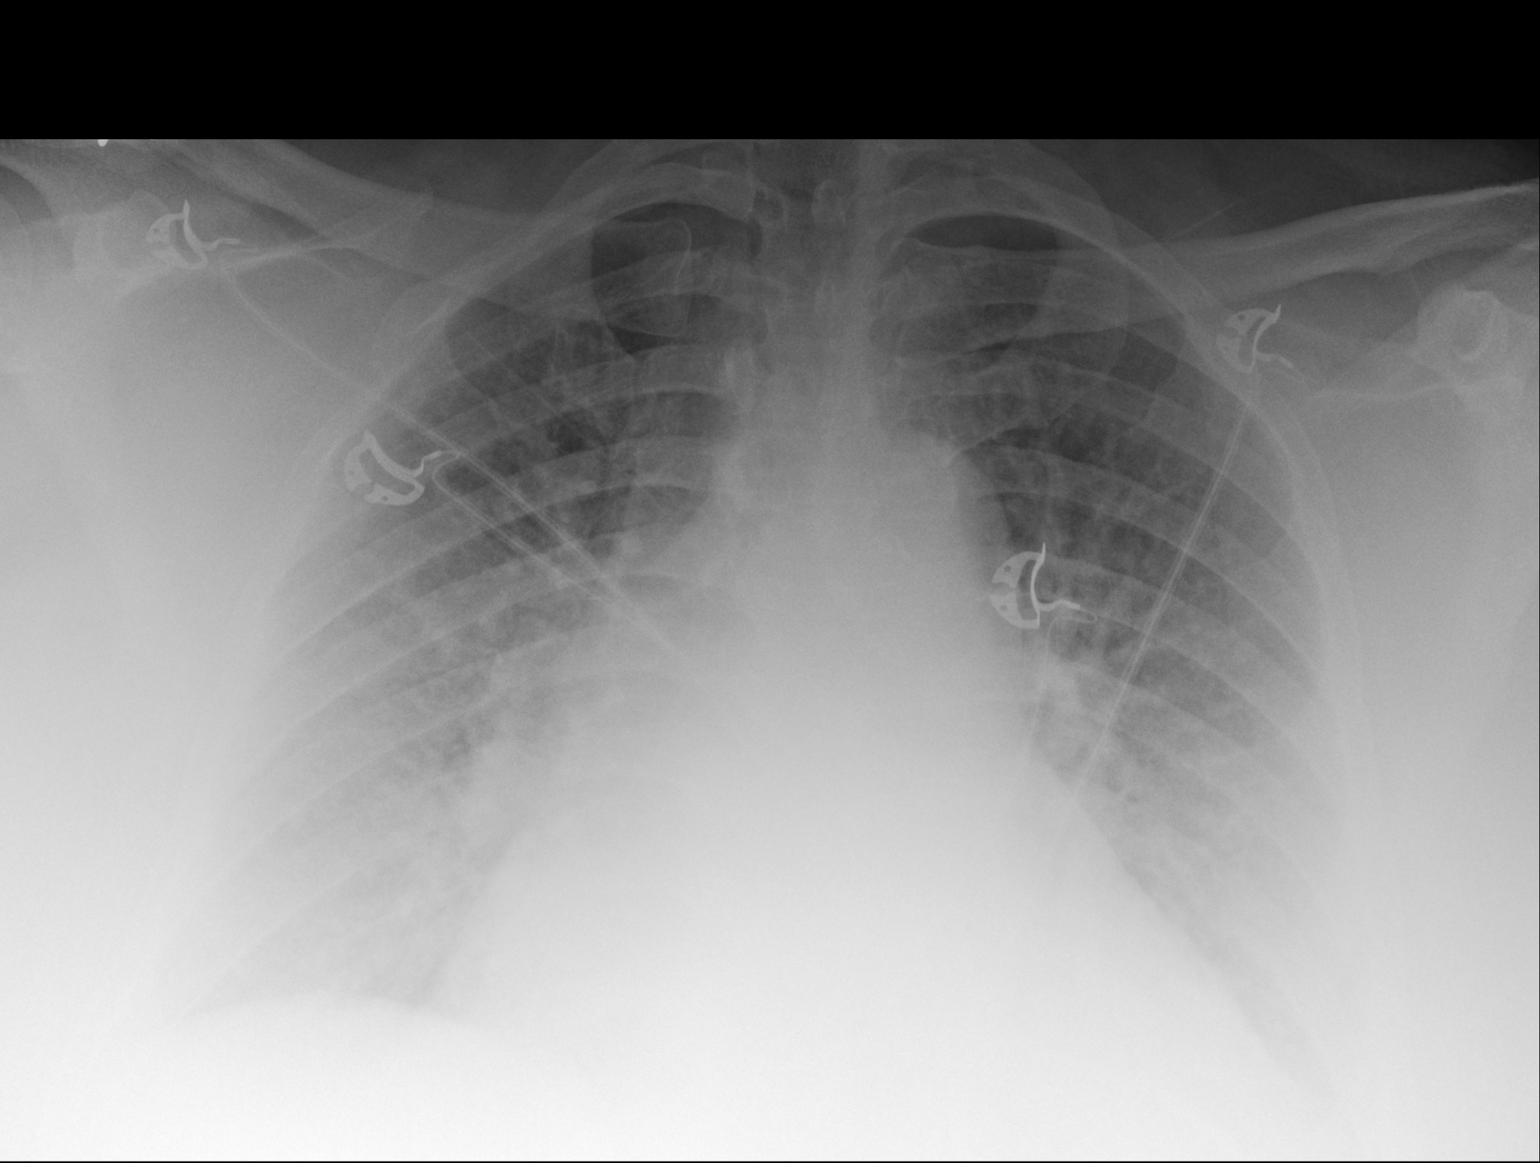
[im 2/2]
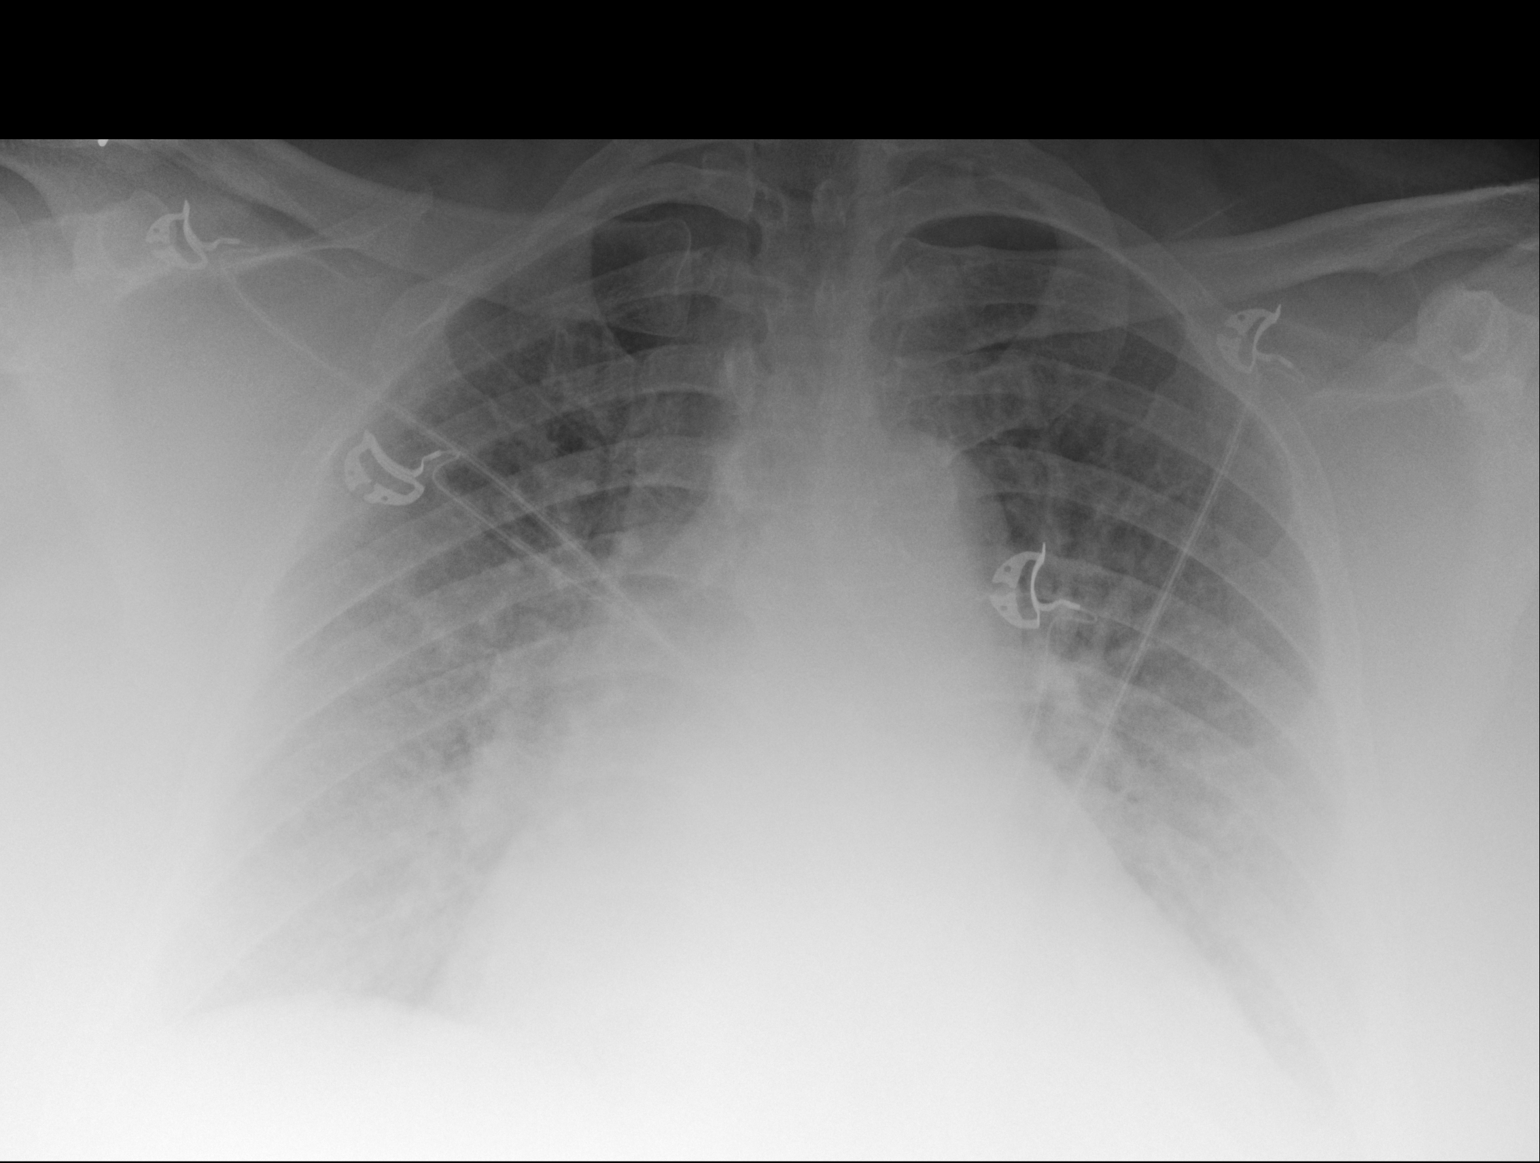

[2 of 2 positions shown; findings below may reference images not displayed]

FINDINGS: Cardiomegaly with vascular pedicle widening and cephalized blood
flow. No visible Kerley lines but limited by soft tissue
attenuation. No effusion or pneumothorax
IMPRESSION: Cardiomegaly and vascular congestion.

## 2020-09-09 IMAGING — CR PORTABLE CHEST - 1 VIEW
1 series · 2 of 2 positions shown · non-contrast
Comparison: 01/02/2019, 12/14/2018, 12/08/2018

CLINICAL DATA: Shortness of breath

EXAM:
PORTABLE CHEST 1 VIEW

[Series 4: ap · 0.17mm/px · 2 of 2 slices shown]
[im 1/2]
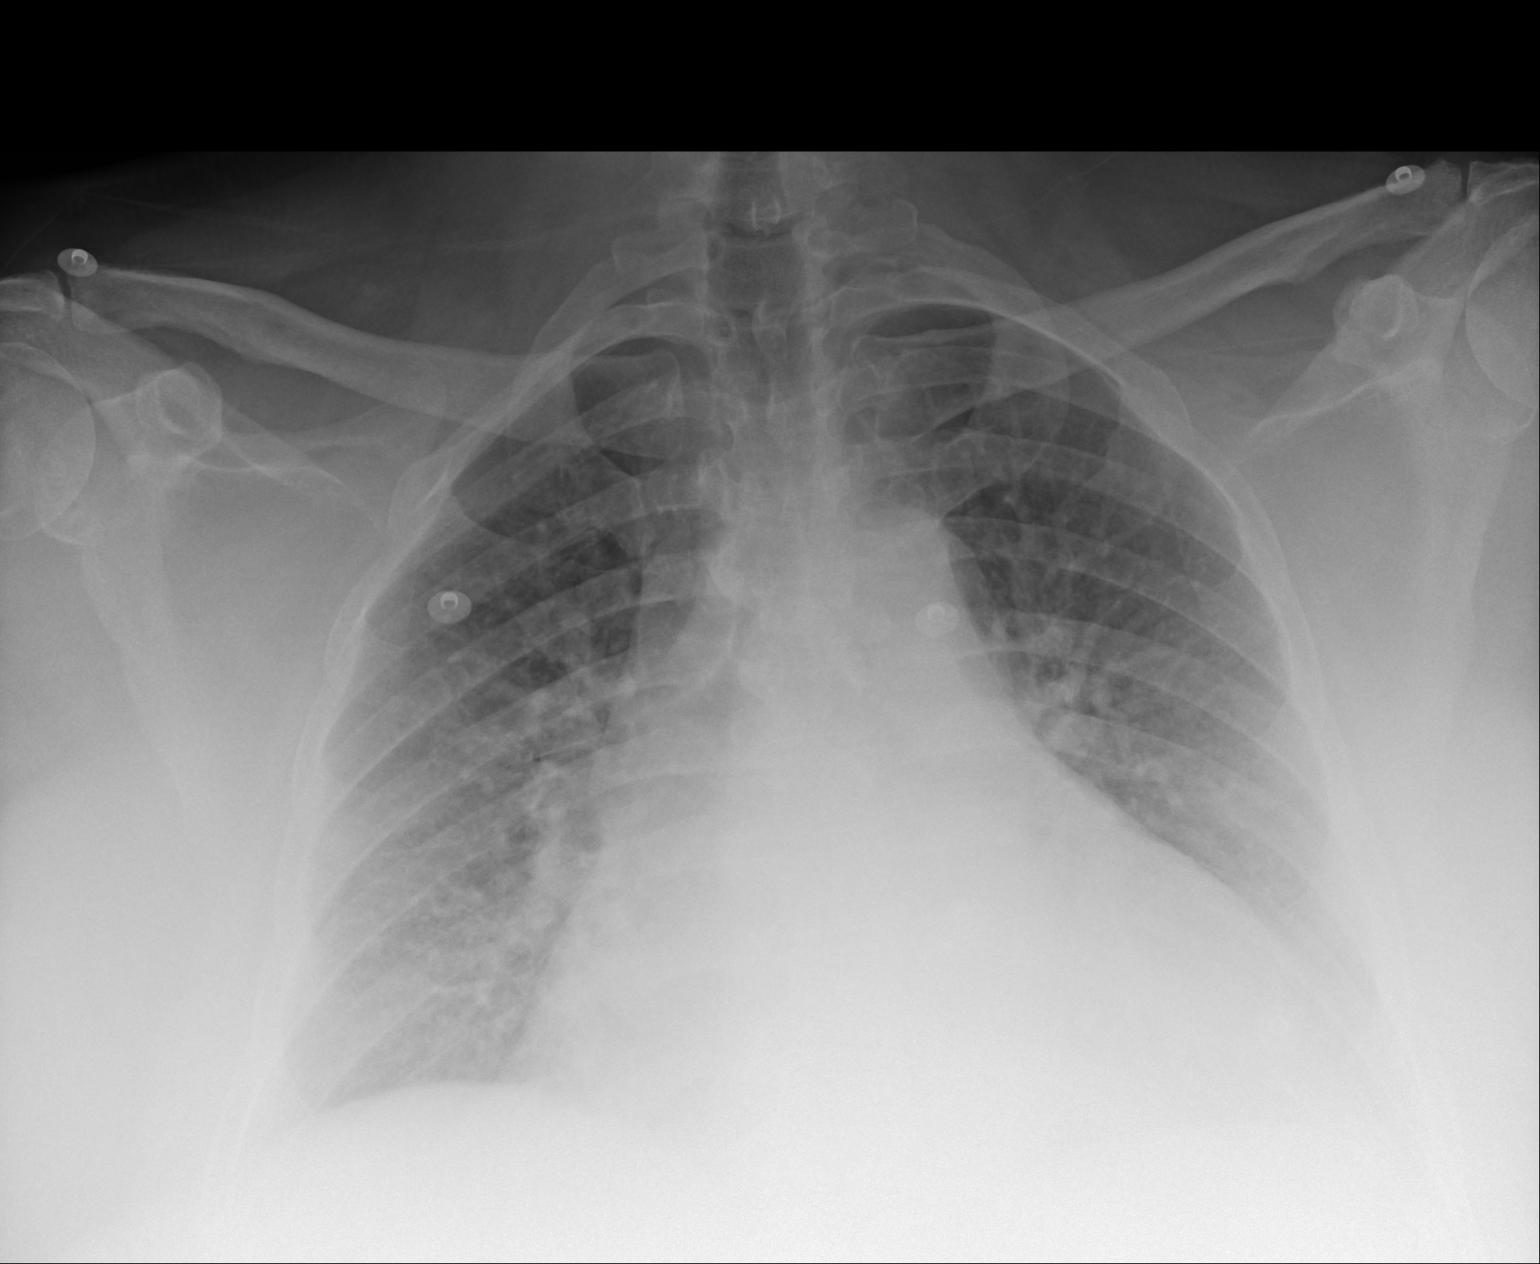
[im 2/2]
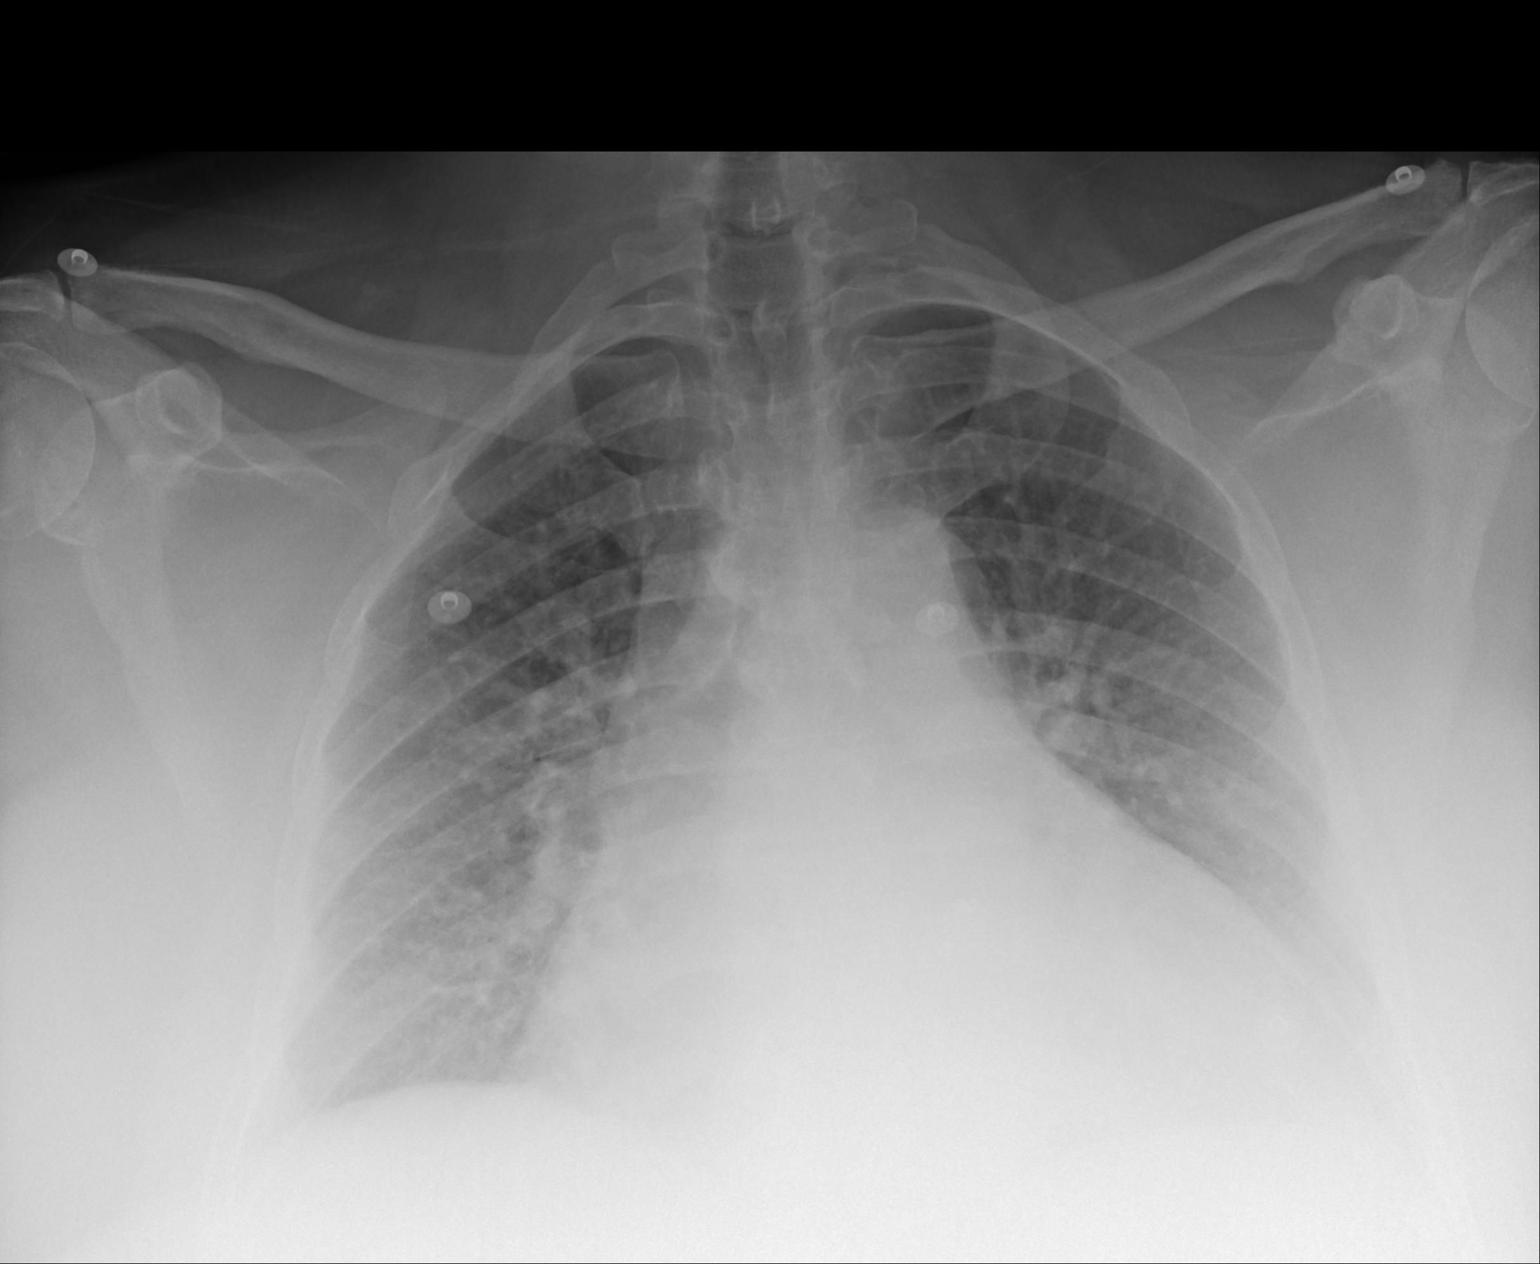

[2 of 2 positions shown; findings below may reference images not displayed]

FINDINGS: Cardiomegaly with vascular congestion. No large effusion. Hazy
opacity left base, likely attributable to overlying soft tissues. No
pneumothorax.
IMPRESSION: Limited by body habitus.  Cardiomegaly with vascular congestion.

## 2020-09-15 IMAGING — CR PORTABLE CHEST - 1 VIEW
1 series · 2 of 2 positions shown · non-contrast
Comparison: 01/03/2019.

CLINICAL DATA: Increased shortness of breath over the past 2 days.

EXAM:
PORTABLE CHEST 1 VIEW

[Series 1: portable · 0.17mm/px · 2 of 2 slices shown]
[im 1/2]
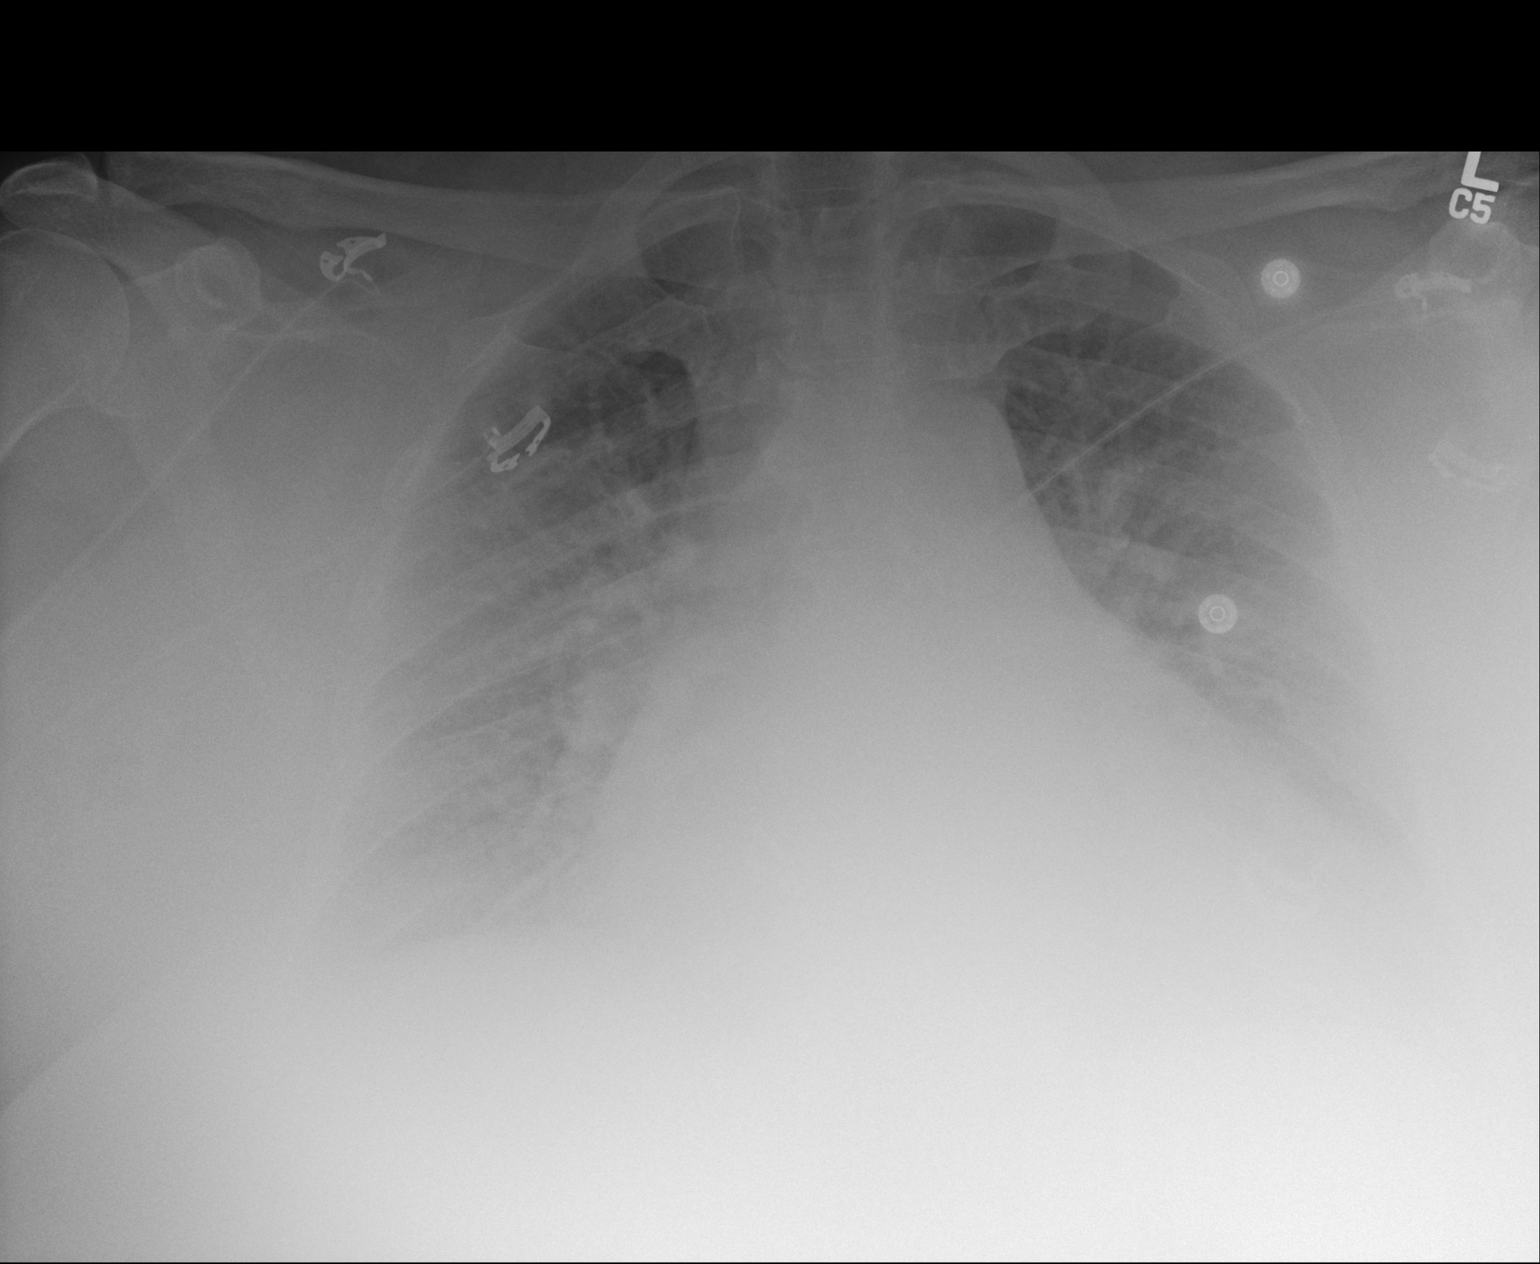
[im 2/2]
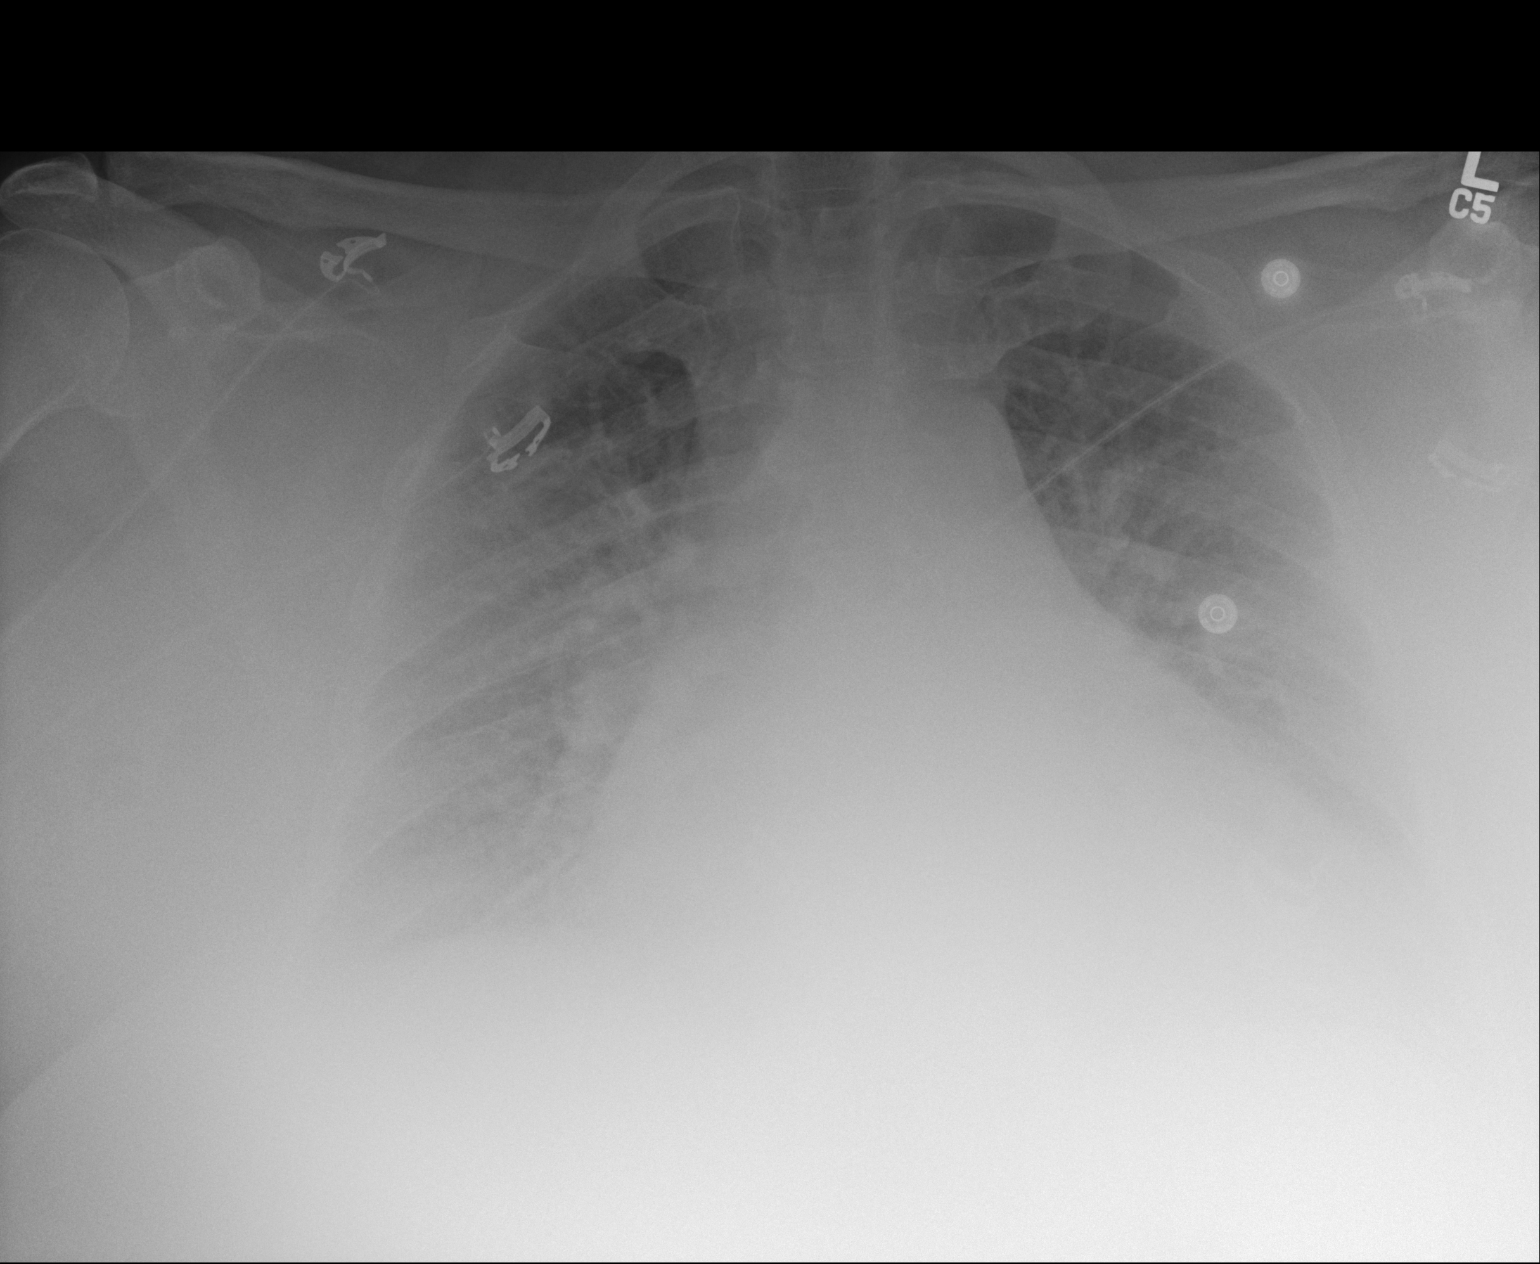

[2 of 2 positions shown; findings below may reference images not displayed]

FINDINGS: Stable enlarged cardiac silhouette. Mild increase in prominence of
the pulmonary vasculature and interstitial markings. Possible small
bilateral pleural effusions. Thoracic spine degenerative changes.
IMPRESSION: Mildly progressive changes of congestive heart failure with stable
cardiomegaly.

## 2020-10-14 IMAGING — CR PORTABLE CHEST - 1 VIEW
1 series · 1 of 1 positions shown · non-contrast
Comparison: Portable exam 8969 hours compared to 01/09/2019

CLINICAL DATA: Shortness of breath, nonproductive cough for 1 week,
history CHF, asthma, hypertension, atrial fibrillation, non ischemic
cardiomyopathy

EXAM:
PORTABLE CHEST 1 VIEW

[portable]
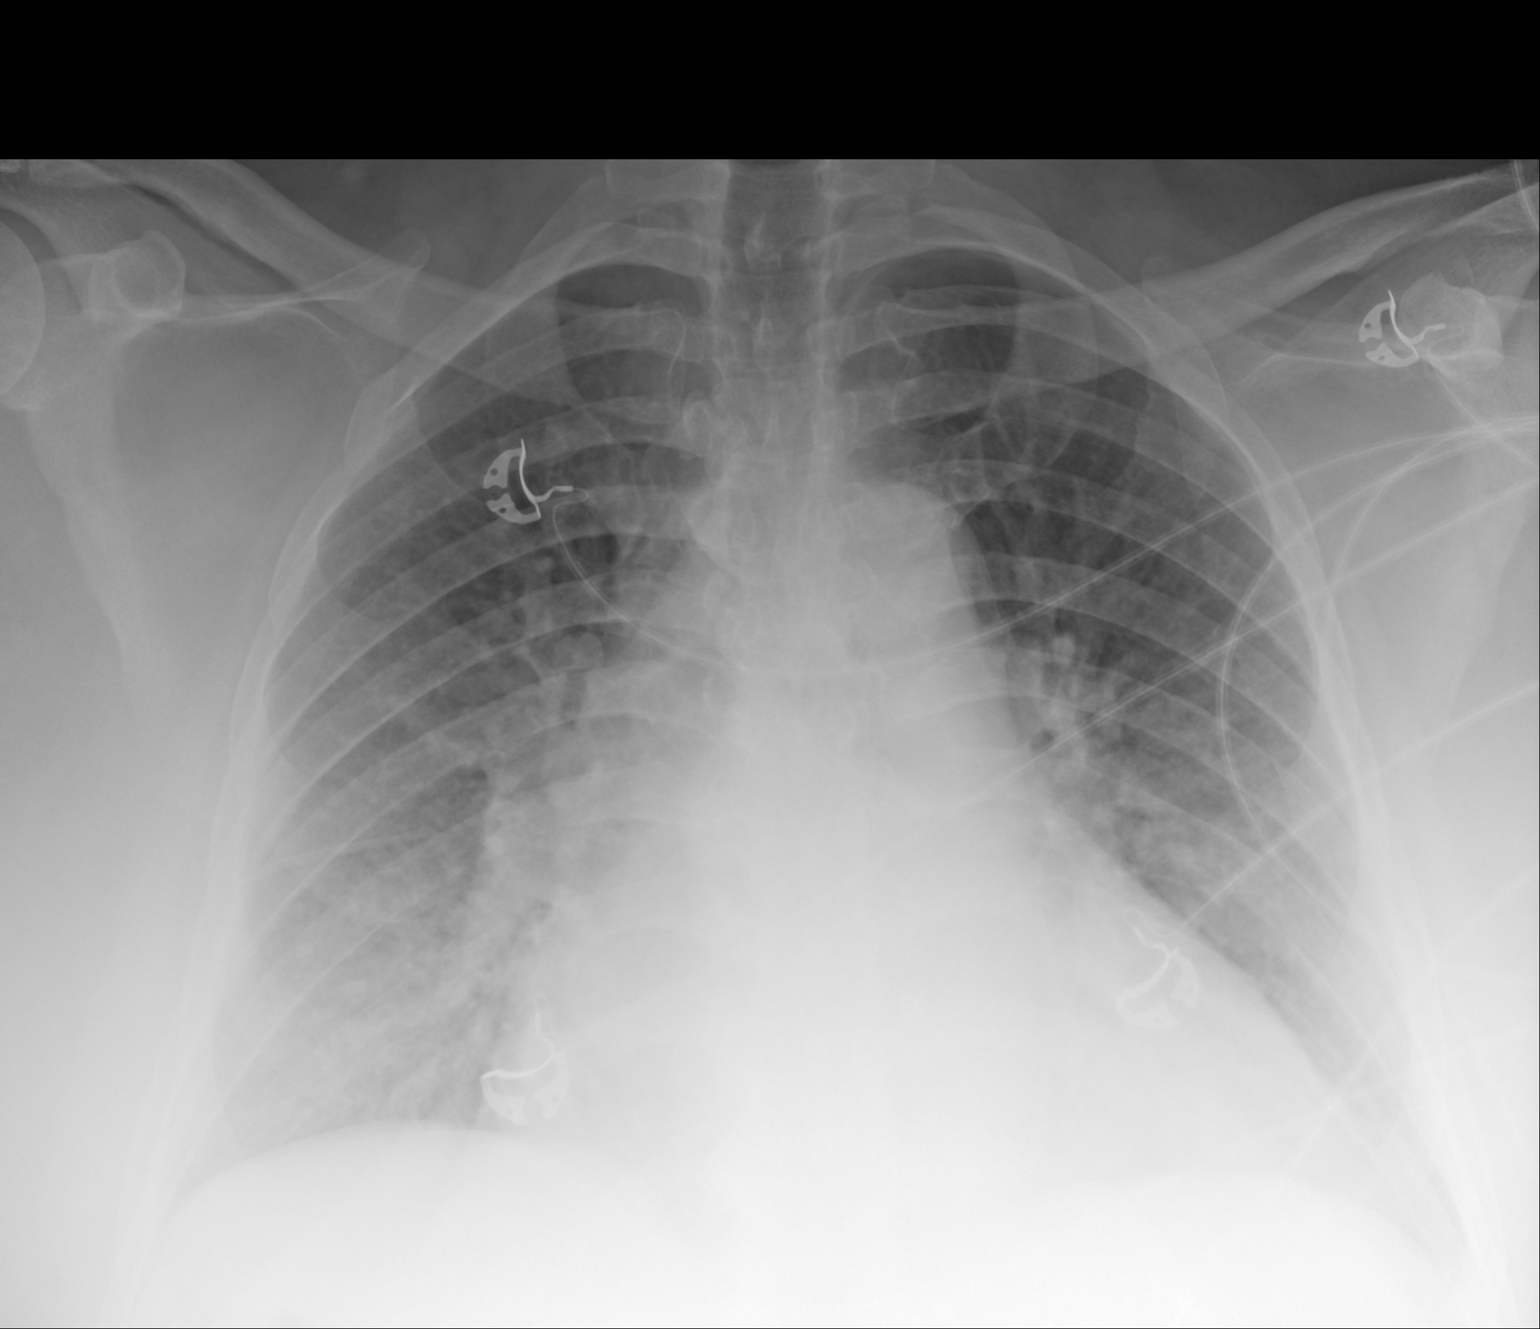

[1 of 1 positions shown; findings below may reference images not displayed]

FINDINGS: Enlargement of cardiac silhouette with pulmonary vascular
congestion.

Mediastinal contour stable.

Minimal pulmonary edema slightly greater on RIGHT.

No pleural effusion or pneumothorax.

Bones unremarkable.
IMPRESSION: Mild chronic failure.

## 2020-10-24 IMAGING — US RIGHT LOWER EXTREMITY VENOUS ULTRASOUND
1 series · 13 of 24 positions shown · non-contrast
Comparison: None.

CLINICAL DATA: Right lower extremity edema, pain



[Series 1: right lower extremity venous ultrasound · 0.11mm/px · 13 of 69 slices shown]
[im 1/69]
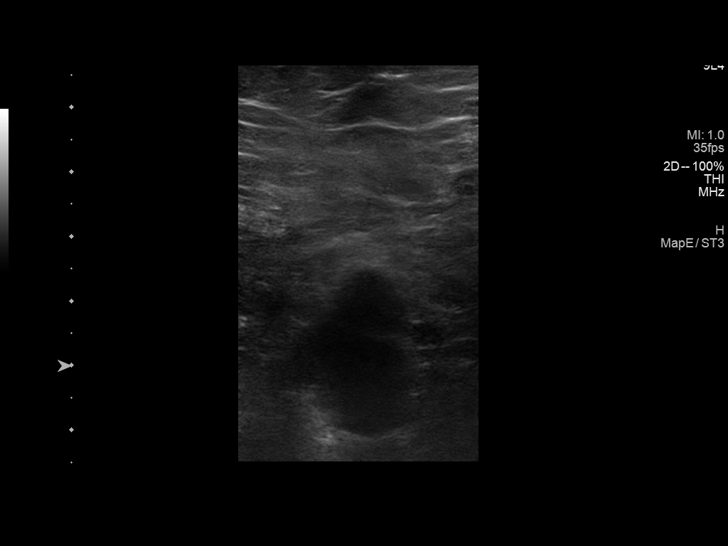
[im 6/69]
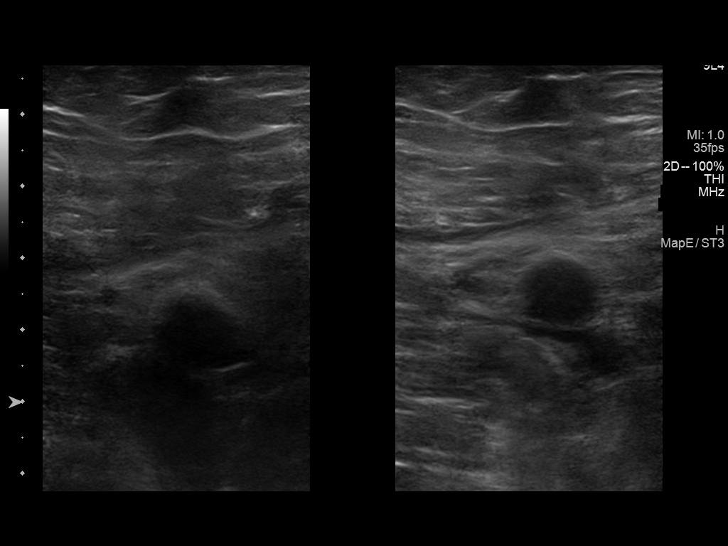
[im 12/69]
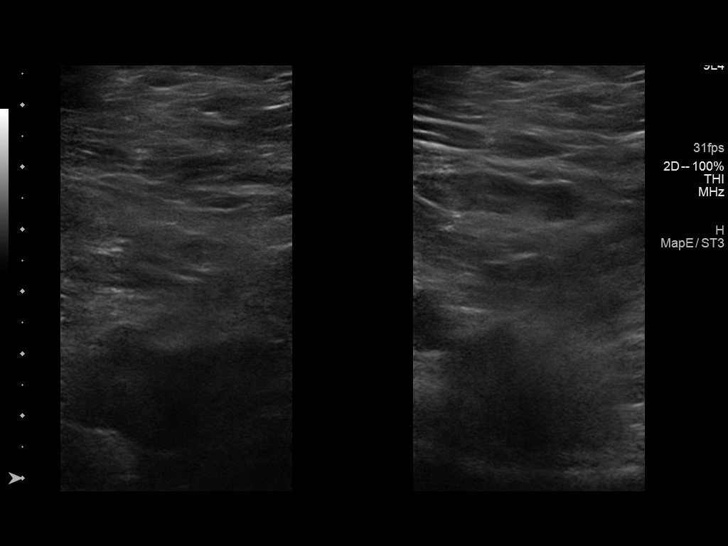
[im 18/69]
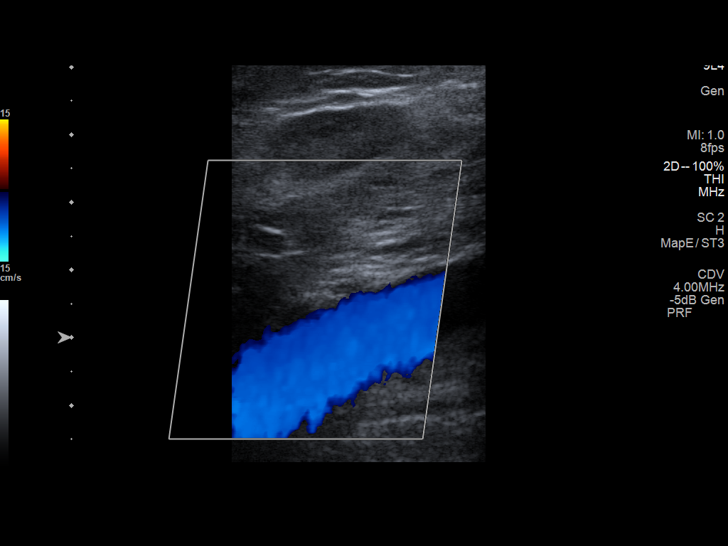
[im 24/69]
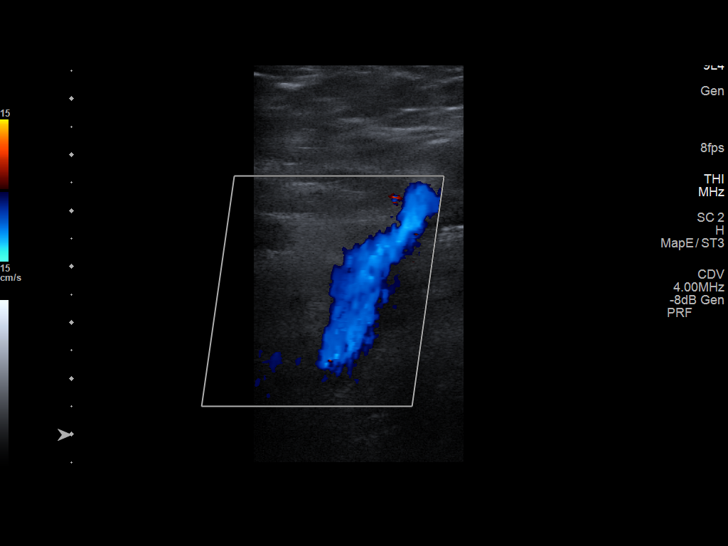
[im 30/69]
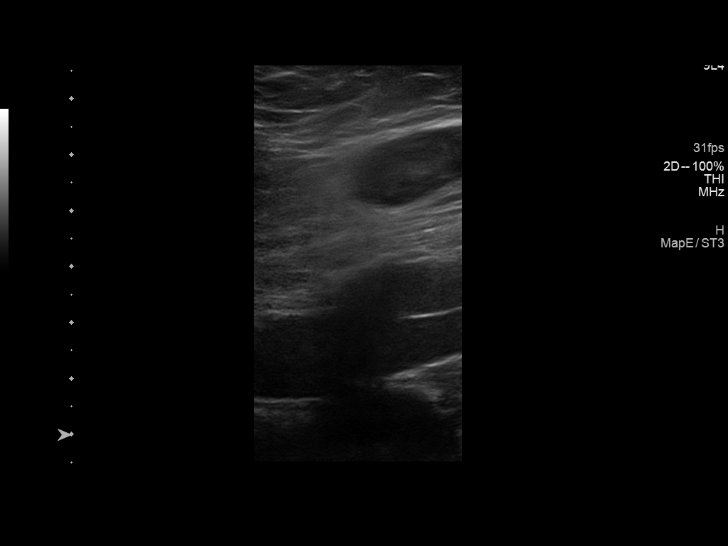
[im 36/69]
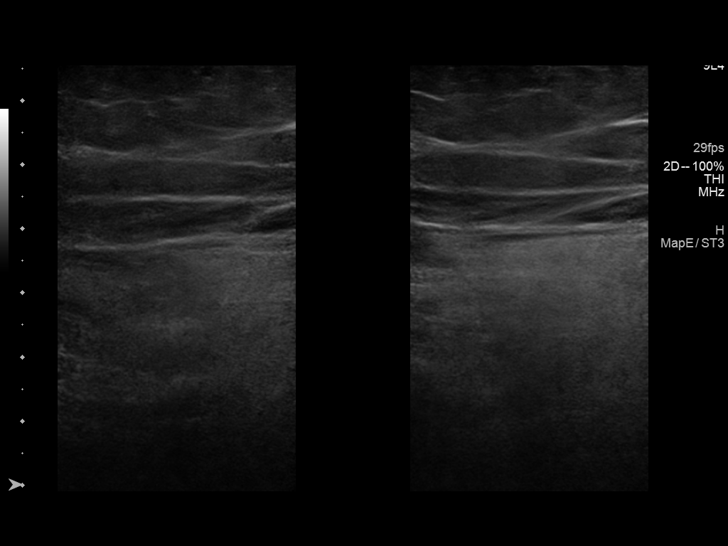
[im 39/69]
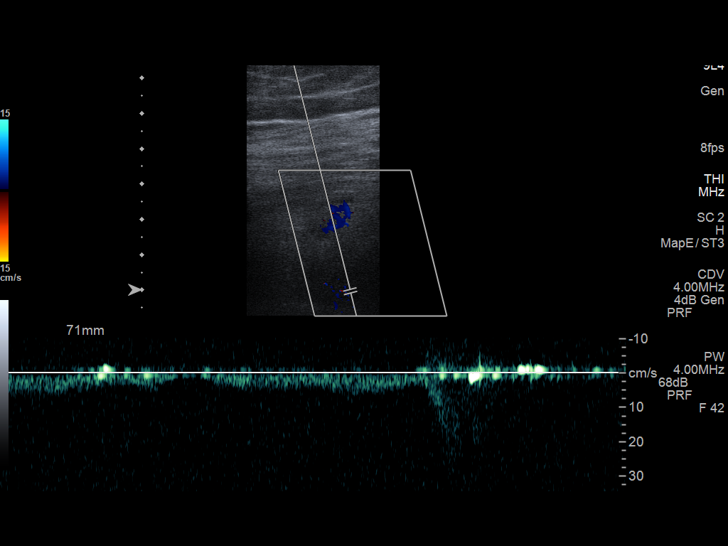
[im 45/69]
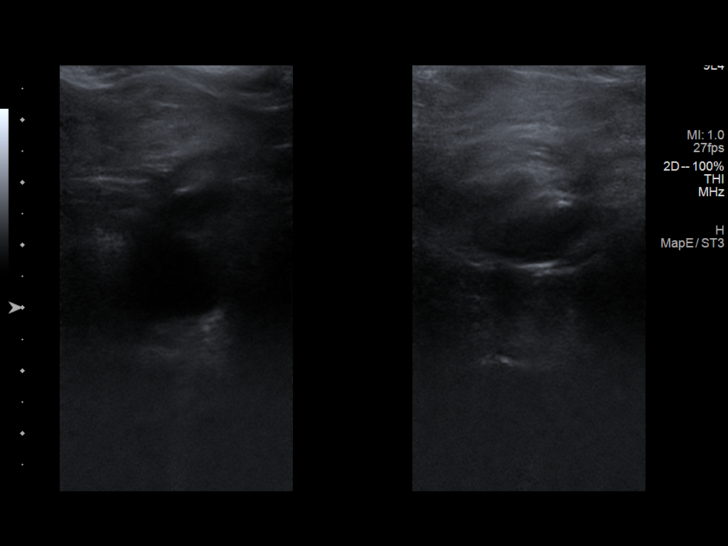
[im 51/69]
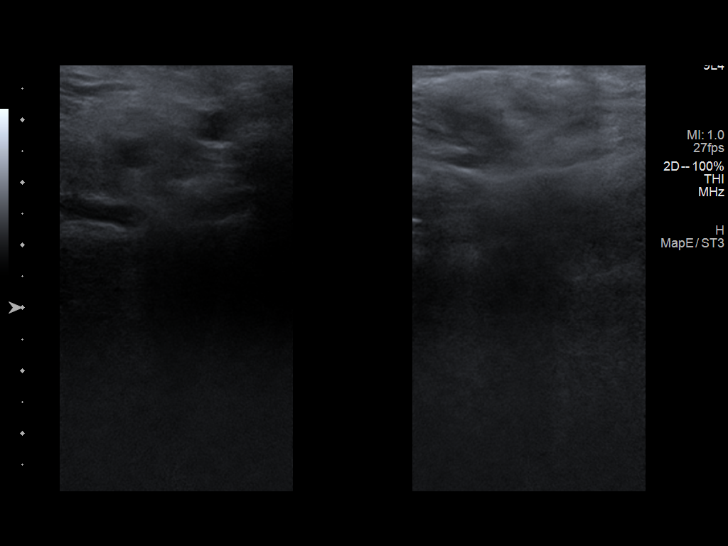
[im 57/69]
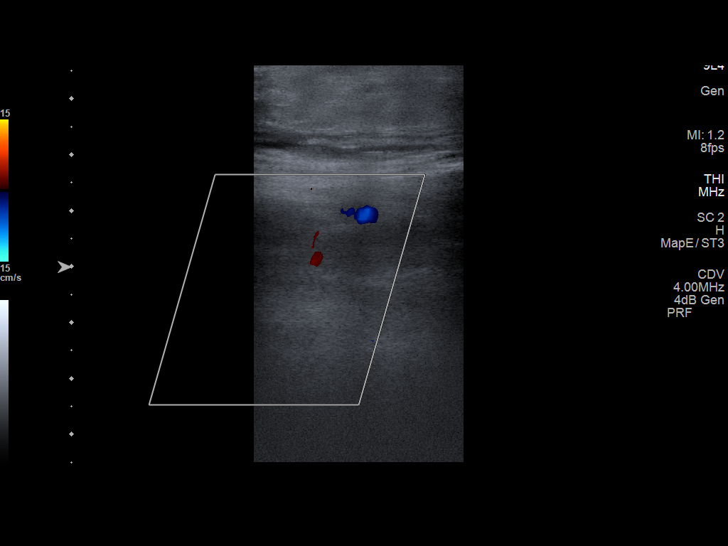
[im 63/69]
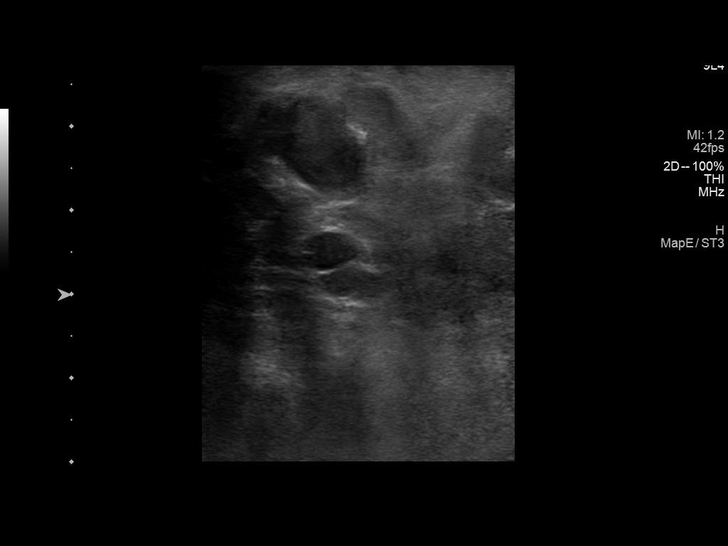
[im 69/69]
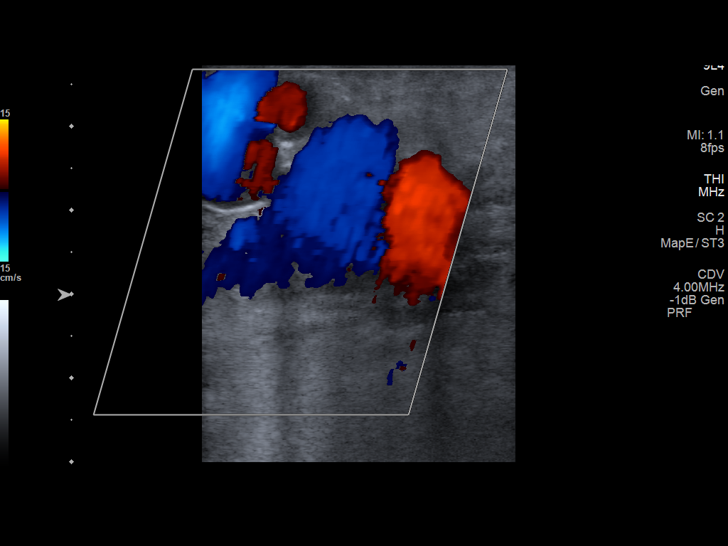

[13 of 24 positions shown; findings below may reference images not displayed]

FINDINGS: Contralateral Common Femoral Vein: Respiratory phasicity is normal
and symmetric with the symptomatic side. No evidence of thrombus.
Normal compressibility.

Common Femoral Vein: No evidence of thrombus. Normal
compressibility, respiratory phasicity and response to augmentation.

Saphenofemoral Junction: No evidence of thrombus. Normal
compressibility and flow on color Doppler imaging.

Profunda Femoral Vein: No evidence of thrombus. Normal
compressibility and flow on color Doppler imaging.

Femoral Vein: No evidence of thrombus. Normal compressibility,
respiratory phasicity and response to augmentation.

Popliteal Vein: No evidence of thrombus. Normal compressibility,
respiratory phasicity and response to augmentation.

Calf Veins: No evidence of thrombus. Normal compressibility and flow
on color Doppler imaging.

Superficial Great Saphenous Vein: No evidence of thrombus. Normal
compressibility.

Venous Reflux:  Not assessed

Other Findings: Large right medial calf sub surface superficial
varicosities noted.
IMPRESSION: Negative for right lower extremity DVT.

Large medial calf sub surface varicosities.

## 2020-10-24 IMAGING — CR PORTABLE CHEST - 1 VIEW
1 series · 1 of 1 positions shown · non-contrast
Comparison: Portable exam 5955 hours compared to 02/07/2019

CLINICAL DATA: RIGHT lower leg pain, tachypnea, chest pain, history
hypertension, CHF, atrial fibrillation, former smoker, asthma

EXAM:
PORTABLE CHEST 1 VIEW

[portable]
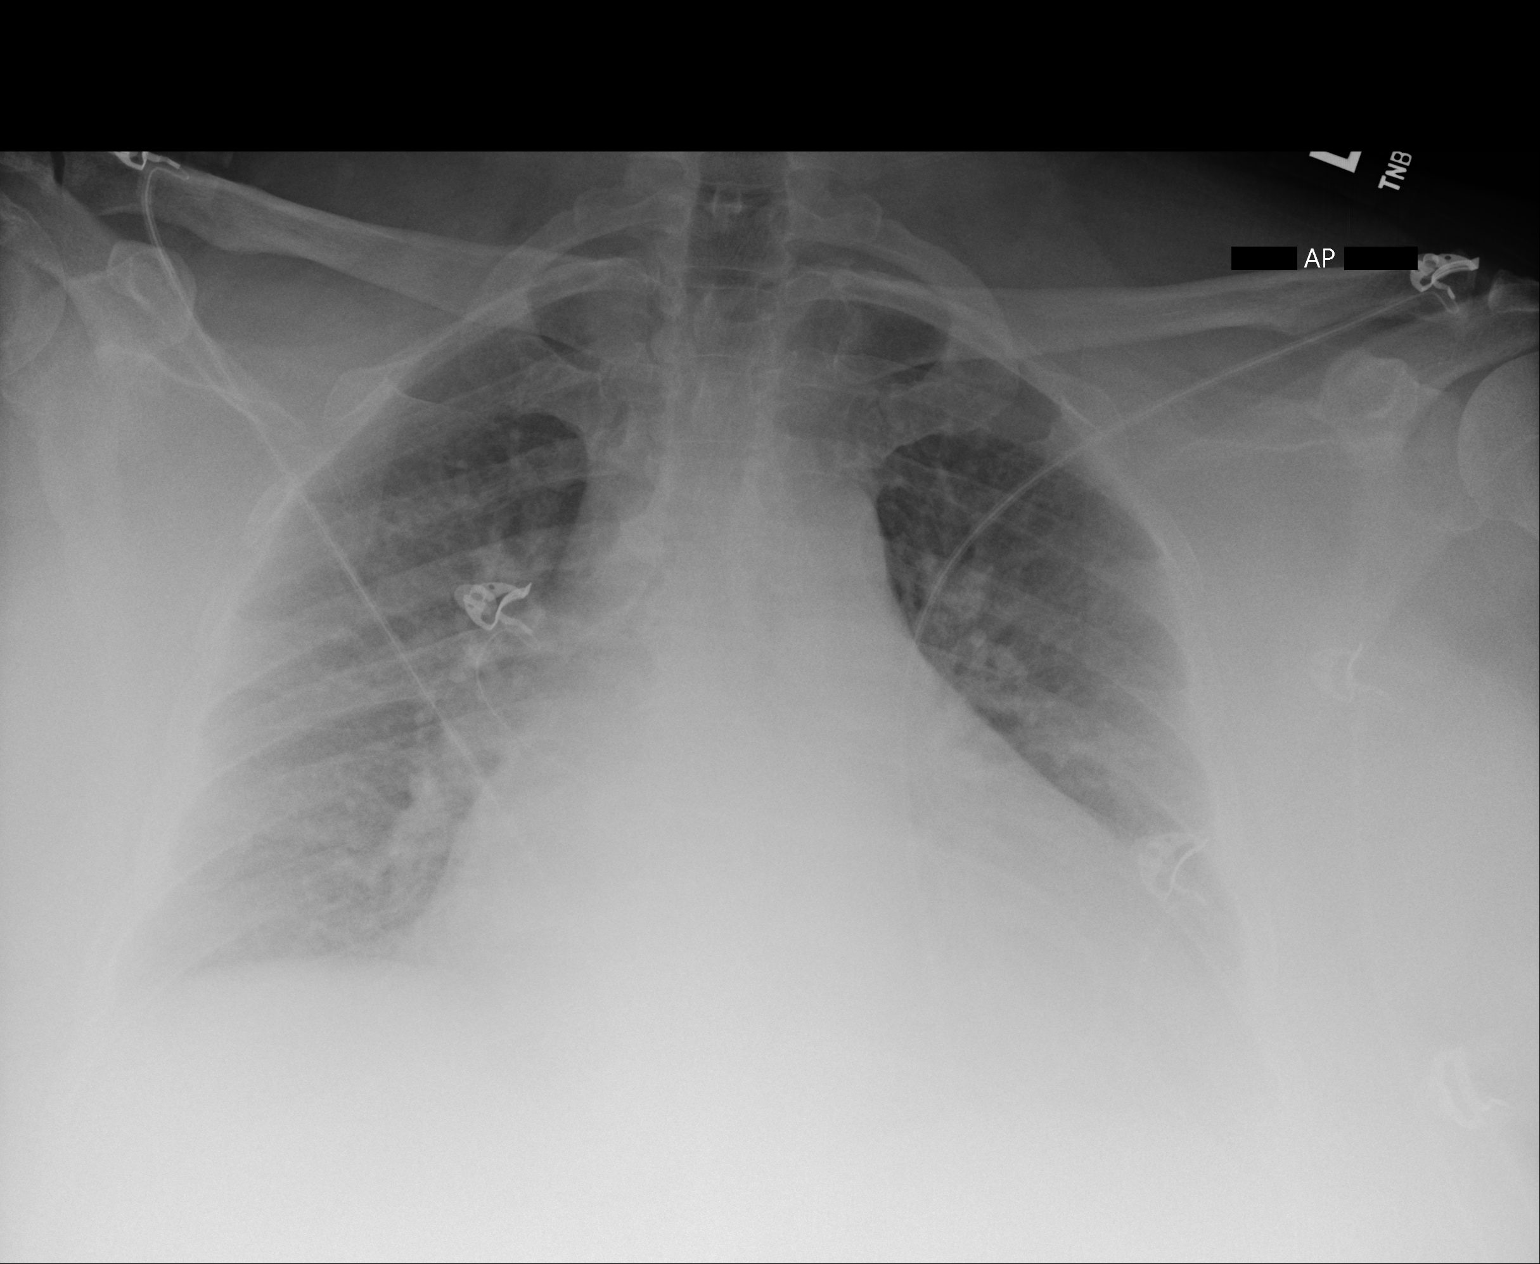

[1 of 1 positions shown; findings below may reference images not displayed]

FINDINGS: Enlargement of cardiac silhouette with pulmonary vascular
congestion.

Stable mediastinal contours.

Underpenetration.

No definite acute infiltrate, pleural effusion or pneumothorax.
IMPRESSION: Enlargement of cardiac silhouette with pulmonary vascular
congestion.

No definite acute abnormalities.

## 2020-10-24 IMAGING — CT CT ANGIOGRAPHY CHEST
2 of 6 series · 16 of 46 positions shown · IV contrast (omnipaque)
Comparison: Chest CT-11/05/2015

CLINICAL DATA: Right lower extremity pain, now with tachypnea and
chest pain. Evaluate for pulmonary embolism.

EXAM:
CT ANGIOGRAPHY CHEST WITH CONTRAST
TECHNIQUE: Multidetector CT imaging of the chest was performed using the
standard protocol during bolus administration of intravenous
contrast. Multiplanar CT image reconstructions and MIPs were
obtained to evaluate the vascular anatomy.
CONTRAST:  75mL OMNIPAQUE IOHEXOL 350 MG/ML SOLN

[Series 5: pe axial thins · axial · 0.73mm/px · z∈[-324,-57]mm · 13 of 293 slices shown]
[im 13/293  lung]
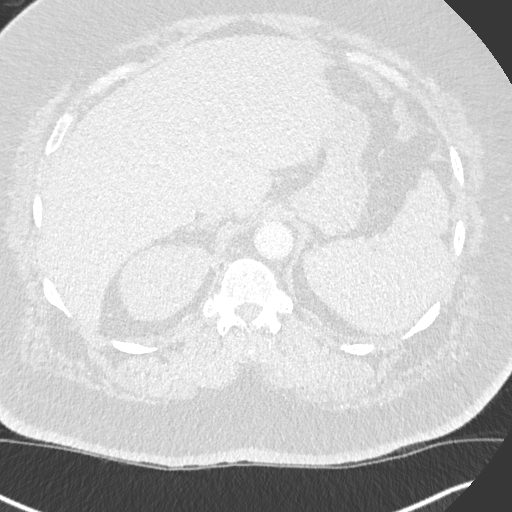
[im 39/293  soft-tissue]
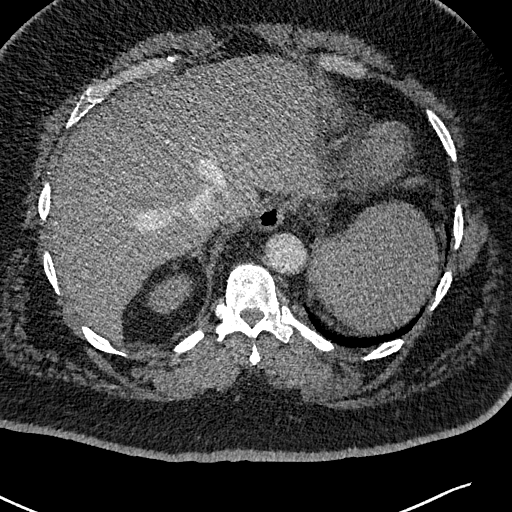
[im 64/293  lung]
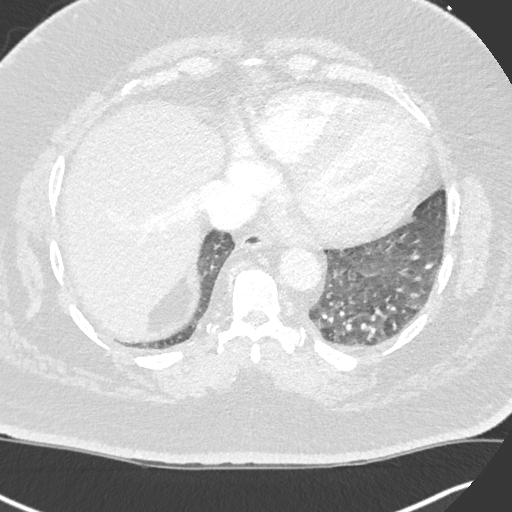
[im 77/293  soft-tissue]
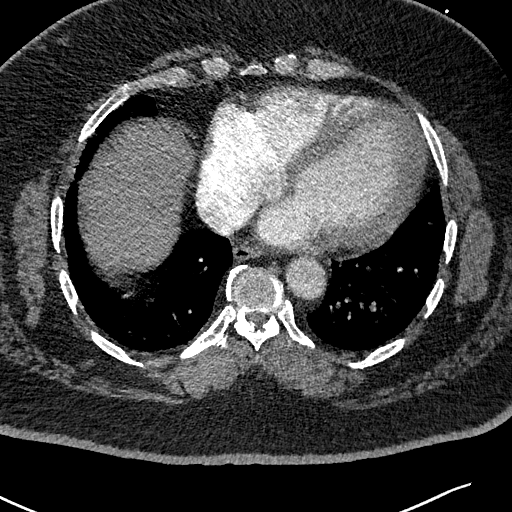
[im 102/293  lung]
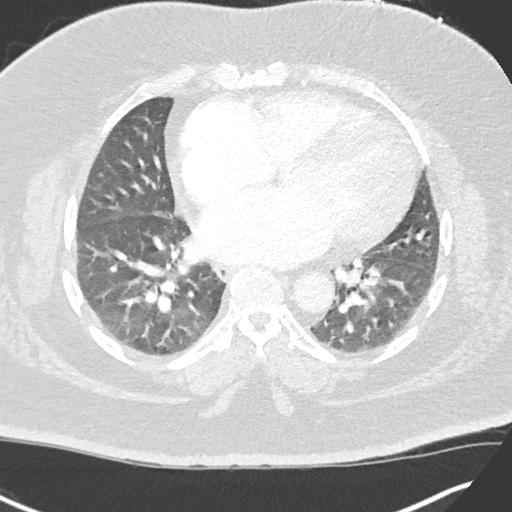
[im 127/293  soft-tissue]
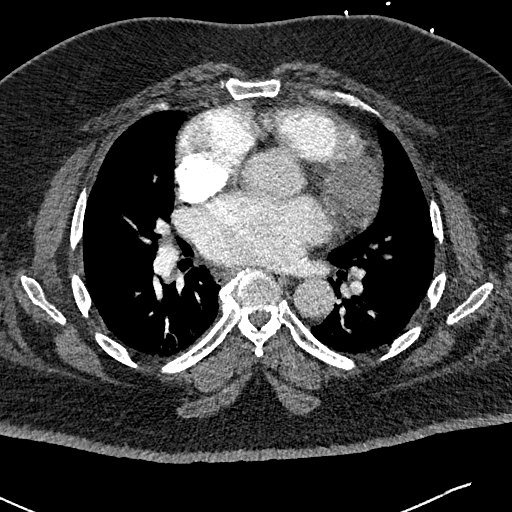
[im 153/293  lung]
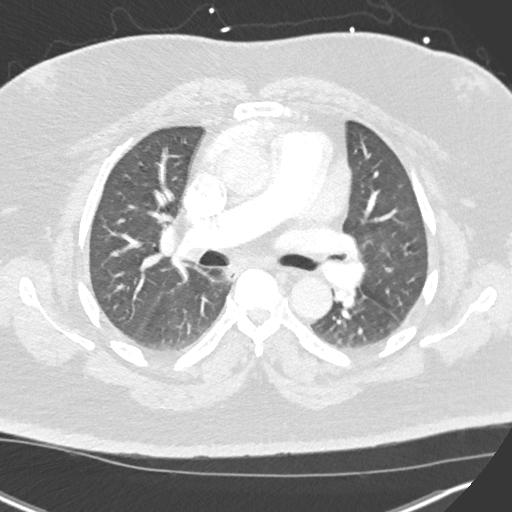
[im 166/293  soft-tissue]
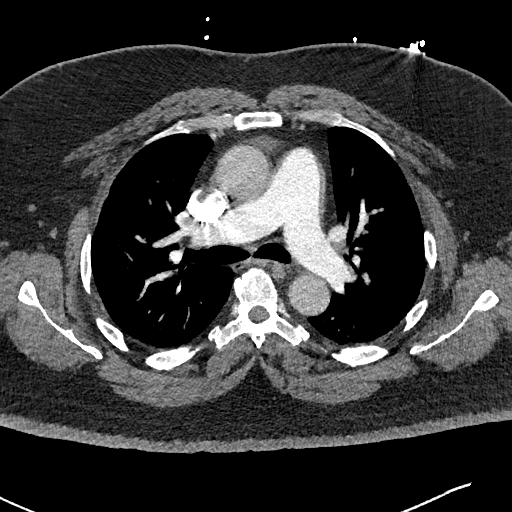
[im 191/293  lung]
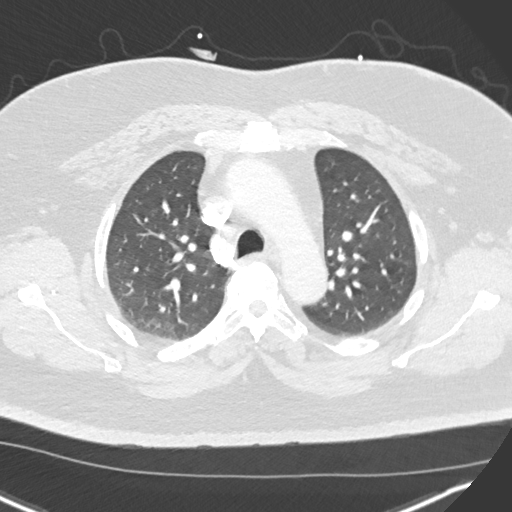
[im 216/293  soft-tissue]
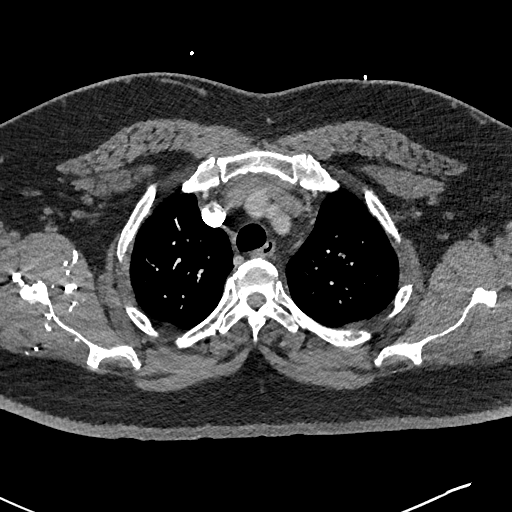
[im 229/293  lung]
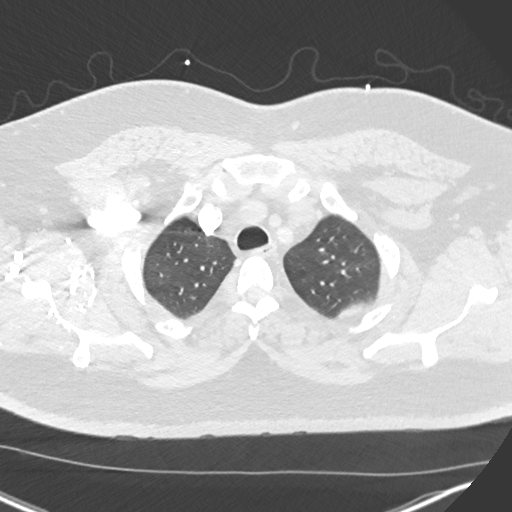
[im 254/293  soft-tissue]
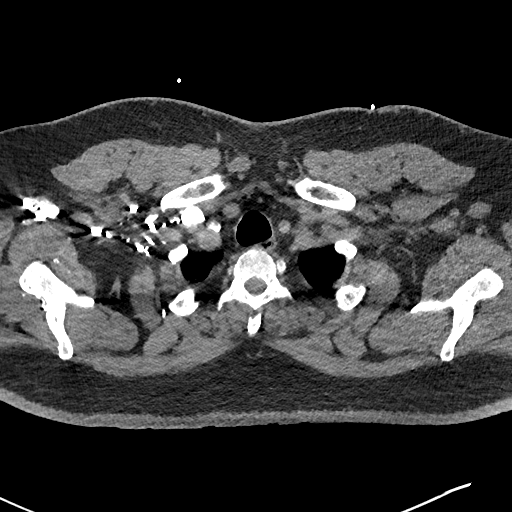
[im 280/293  lung]
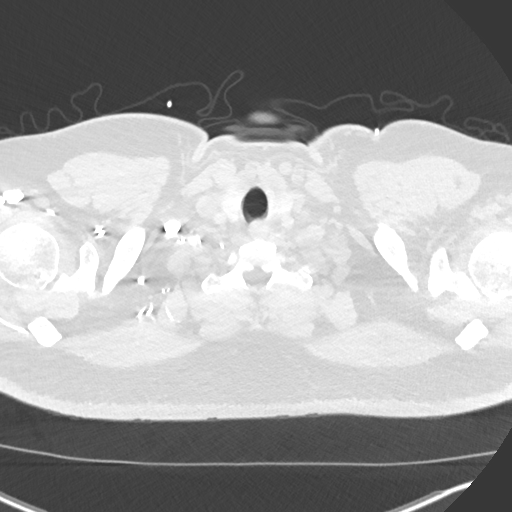

[Series 7: cor soft · coronal · 0.59mm/px · 3 of 173 slices shown]
[im 44/173  soft-tissue]
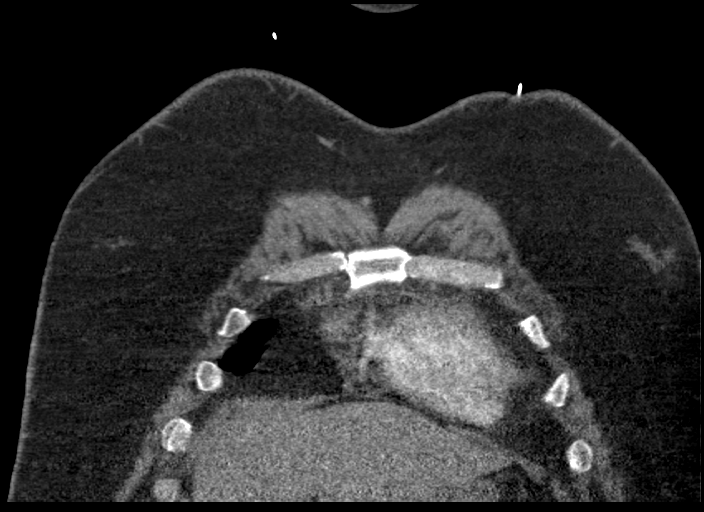
[im 87/173  soft-tissue]
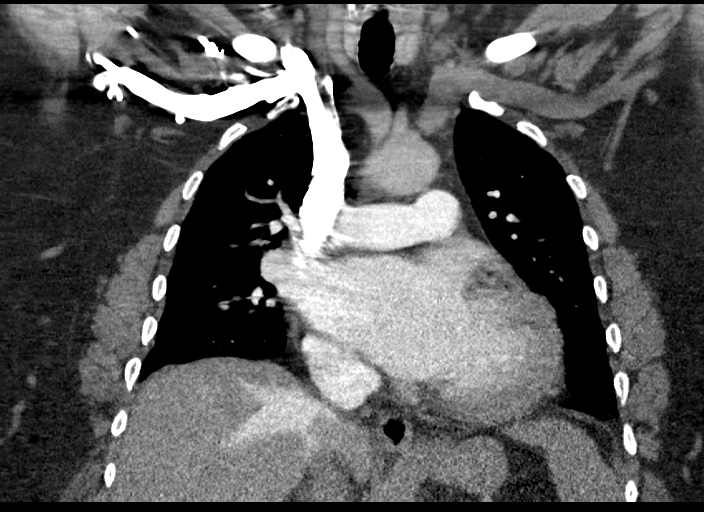
[im 130/173  soft-tissue]
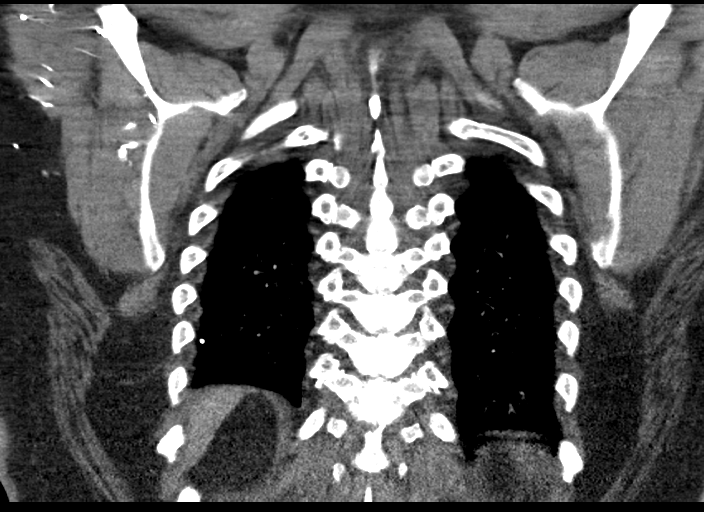

[16 of 46 positions shown; findings below may reference images not displayed]

FINDINGS: Vascular Findings:

There is adequate opacification of the pulmonary arterial system
with the main pulmonary artery measuring 220 Hounsfield units. There
are no discrete filling defects within the central pulmonary
arterial tree to suggest pulmonary embolism. Evaluation of the
distal most bilateral subsegmental pulmonary arteries is degraded
secondary to a combination of suboptimal vessel opacification as
well as quantum mottle artifact due to patient body habitus.
Enlarged caliber of the main pulmonary artery measuring 37 mm in
diameter, similar to the 4892 examination.

Cardiomegaly. Trace amount of fluid seen with the pericardial
recess. No pericardial effusion.

No evidence of thoracic aortic aneurysm or dissection on this
nongated examination. Conventional gray shin of the aortic arch.
Minimal amount of atherosclerotic plaque within the aortic arch.

Review of the MIP images confirms the above findings.

----------------------------------------------------------------------------------

Nonvascular Findings:

Mediastinum/Lymph Nodes: No bulky mediastinal, hilar or axillary
lymphadenopathy.

Lungs/Pleura: Evaluation of the pulmonary parenchyma is minimally
degraded due to patient motion artifact as well as quantum mottle
artifact due to patient body habitus.

Overall marked improved aeration of the lungs compared to remote
chest CT performed 11/05/2015. Minimal dependent subpleural
ground-glass atelectasis. No discrete focal airspace opacities or
air bronchograms. No pleural effusion or pneumothorax. The central
pulmonary airways appear widely patent.

No new pulmonary nodules. Punctate granuloma are again seen within
the right upper (image 69, series 6) and lower (images 92 and 101,
series 6) lobes, the sequela of prior granulomatous infection.
Ill-defined punctate (approximately 4 mm) ground-glass nodule within
the left lung apex (image 37, series 6), unchanged compared to the
[DATE] examination.

Upper abdomen: Limited early arterial phase evaluation of the upper
abdomen demonstrates mild hepatomegaly with reflux of contrast into
the hepatic veins and intrahepatic IVC. Borderline splenomegaly with
the spleen measuring 12.7 cm in length.

Musculoskeletal: No acute or aggressive osseous abnormalities.
Stigmata of DISH within the thoracic spine. Degenerative change of
the sternomanubrial joint. Symmetric bilateral gynecomastia. Normal
appearance of the imaged portions of the thyroid gland.
IMPRESSION: 1. No acute cardiopulmonary disease. Specifically, no evidence of
pulmonary embolism to the level the bilateral subsegmental pulmonary
arteries.
2. Cardiomegaly with reflux of contrast into the hepatic venous
system and intrahepatic IVC, nonspecific though could be seen in the
setting of right-sided heart failure. Additionally, there is
enlargement of the caliber of the main pulmonary artery which could
be seen in the setting of pulmonary arterial hypertension. Further
evaluation cardiac echo could be performed as indicated.
3.  Aortic Atherosclerosis ([id number]).
